# Patient Record
Sex: Male | Born: 1955 | Race: White | Hispanic: No | Marital: Married | Smoking: Former smoker
Health system: Southern US, Community
[De-identification: ages and names within clinical notes are randomized; demographics above are authoritative.]

## PROBLEM LIST (undated history)

## (undated) DIAGNOSIS — I9589 Other hypotension: Secondary | ICD-10-CM

## (undated) DIAGNOSIS — I471 Supraventricular tachycardia: Secondary | ICD-10-CM

## (undated) DIAGNOSIS — Z87891 Personal history of nicotine dependence: Secondary | ICD-10-CM

## (undated) DIAGNOSIS — M109 Gout, unspecified: Secondary | ICD-10-CM

## (undated) DIAGNOSIS — F32A Depression, unspecified: Secondary | ICD-10-CM

## (undated) DIAGNOSIS — I255 Ischemic cardiomyopathy: Secondary | ICD-10-CM

## (undated) DIAGNOSIS — I779 Disorder of arteries and arterioles, unspecified: Secondary | ICD-10-CM

## (undated) DIAGNOSIS — C7A1 Malignant poorly differentiated neuroendocrine tumors: Secondary | ICD-10-CM

## (undated) DIAGNOSIS — Z973 Presence of spectacles and contact lenses: Secondary | ICD-10-CM

## (undated) DIAGNOSIS — Z9289 Personal history of other medical treatment: Secondary | ICD-10-CM

## (undated) DIAGNOSIS — F329 Major depressive disorder, single episode, unspecified: Secondary | ICD-10-CM

## (undated) DIAGNOSIS — G43909 Migraine, unspecified, not intractable, without status migrainosus: Secondary | ICD-10-CM

## (undated) DIAGNOSIS — R0600 Dyspnea, unspecified: Secondary | ICD-10-CM

## (undated) DIAGNOSIS — J189 Pneumonia, unspecified organism: Secondary | ICD-10-CM

## (undated) DIAGNOSIS — I219 Acute myocardial infarction, unspecified: Secondary | ICD-10-CM

## (undated) DIAGNOSIS — K219 Gastro-esophageal reflux disease without esophagitis: Secondary | ICD-10-CM

## (undated) DIAGNOSIS — B1920 Unspecified viral hepatitis C without hepatic coma: Secondary | ICD-10-CM

## (undated) DIAGNOSIS — D649 Anemia, unspecified: Secondary | ICD-10-CM

## (undated) DIAGNOSIS — I739 Peripheral vascular disease, unspecified: Secondary | ICD-10-CM

## (undated) DIAGNOSIS — Z87442 Personal history of urinary calculi: Secondary | ICD-10-CM

## (undated) DIAGNOSIS — I959 Hypotension, unspecified: Secondary | ICD-10-CM

## (undated) DIAGNOSIS — Z8489 Family history of other specified conditions: Secondary | ICD-10-CM

## (undated) DIAGNOSIS — I10 Essential (primary) hypertension: Secondary | ICD-10-CM

## (undated) DIAGNOSIS — I4719 Other supraventricular tachycardia: Secondary | ICD-10-CM

## (undated) DIAGNOSIS — E7800 Pure hypercholesterolemia, unspecified: Secondary | ICD-10-CM

## (undated) DIAGNOSIS — C44319 Basal cell carcinoma of skin of other parts of face: Secondary | ICD-10-CM

## (undated) DIAGNOSIS — R55 Syncope and collapse: Secondary | ICD-10-CM

## (undated) DIAGNOSIS — R001 Bradycardia, unspecified: Secondary | ICD-10-CM

## (undated) DIAGNOSIS — F419 Anxiety disorder, unspecified: Secondary | ICD-10-CM

## (undated) DIAGNOSIS — Z972 Presence of dental prosthetic device (complete) (partial): Secondary | ICD-10-CM

## (undated) DIAGNOSIS — I639 Cerebral infarction, unspecified: Secondary | ICD-10-CM

## (undated) DIAGNOSIS — C7A8 Other malignant neuroendocrine tumors: Secondary | ICD-10-CM

## (undated) DIAGNOSIS — I2510 Atherosclerotic heart disease of native coronary artery without angina pectoris: Secondary | ICD-10-CM

## (undated) DIAGNOSIS — M1990 Unspecified osteoarthritis, unspecified site: Secondary | ICD-10-CM

## (undated) DIAGNOSIS — R002 Palpitations: Secondary | ICD-10-CM

## (undated) HISTORY — DX: Atherosclerotic heart disease of native coronary artery without angina pectoris: I25.10

## (undated) HISTORY — DX: Bradycardia, unspecified: R00.1

## (undated) HISTORY — DX: Syncope and collapse: R55

## (undated) HISTORY — PX: BASAL CELL CARCINOMA EXCISION: SHX1214

## (undated) HISTORY — DX: Other hypotension: I95.89

## (undated) HISTORY — DX: Ischemic cardiomyopathy: I25.5

## (undated) HISTORY — PX: CARDIAC CATHETERIZATION: SHX172

## (undated) HISTORY — DX: Personal history of nicotine dependence: Z87.891

## (undated) HISTORY — DX: Disorder of arteries and arterioles, unspecified: I77.9

## (undated) HISTORY — DX: Palpitations: R00.2

## (undated) HISTORY — DX: Peripheral vascular disease, unspecified: I73.9

## (undated) HISTORY — DX: Hypotension, unspecified: I95.9

## (undated) MED FILL — Dexamethasone Sodium Phosphate Inj 100 MG/10ML: INTRAMUSCULAR | Qty: 1 | Status: AC

---
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
## 1999-10-20 ENCOUNTER — Ambulatory Visit (HOSPITAL_COMMUNITY): Admission: RE | Admit: 1999-10-20 | Discharge: 1999-10-20 | Payer: Self-pay | Admitting: Gastroenterology

## 2000-05-19 ENCOUNTER — Encounter: Payer: Self-pay | Admitting: Orthopedic Surgery

## 2000-05-19 ENCOUNTER — Encounter: Admission: RE | Admit: 2000-05-19 | Discharge: 2000-05-19 | Payer: Self-pay | Admitting: Orthopedic Surgery

## 2001-04-25 ENCOUNTER — Encounter: Payer: Self-pay | Admitting: Emergency Medicine

## 2001-04-25 ENCOUNTER — Inpatient Hospital Stay (HOSPITAL_COMMUNITY): Admission: EM | Admit: 2001-04-25 | Discharge: 2001-04-26 | Payer: Self-pay | Admitting: Emergency Medicine

## 2001-04-26 ENCOUNTER — Encounter: Payer: Self-pay | Admitting: Emergency Medicine

## 2005-05-05 ENCOUNTER — Emergency Department (HOSPITAL_COMMUNITY): Admission: EM | Admit: 2005-05-05 | Discharge: 2005-05-05 | Payer: Self-pay | Admitting: Emergency Medicine

## 2005-09-09 ENCOUNTER — Inpatient Hospital Stay (HOSPITAL_COMMUNITY): Admission: EM | Admit: 2005-09-09 | Discharge: 2005-09-10 | Payer: Self-pay | Admitting: Emergency Medicine

## 2006-01-24 ENCOUNTER — Emergency Department (HOSPITAL_COMMUNITY): Admission: EM | Admit: 2006-01-24 | Discharge: 2006-01-24 | Payer: Self-pay | Admitting: Emergency Medicine

## 2006-08-10 ENCOUNTER — Emergency Department (HOSPITAL_COMMUNITY): Admission: EM | Admit: 2006-08-10 | Discharge: 2006-08-11 | Payer: Self-pay | Admitting: Emergency Medicine

## 2007-07-09 IMAGING — CR DG CHEST 1V PORT
1 series · 1 of 1 positions shown · non-contrast
Comparison: none

CLINICAL DATA: Cough.  Shortness of breath.  Hematemesis.  
 PORTABLE CHEST - 1 VIEW:
 The heart size and mediastinal contours are within normal limits.  Both lungs are clear.

[view not recorded]
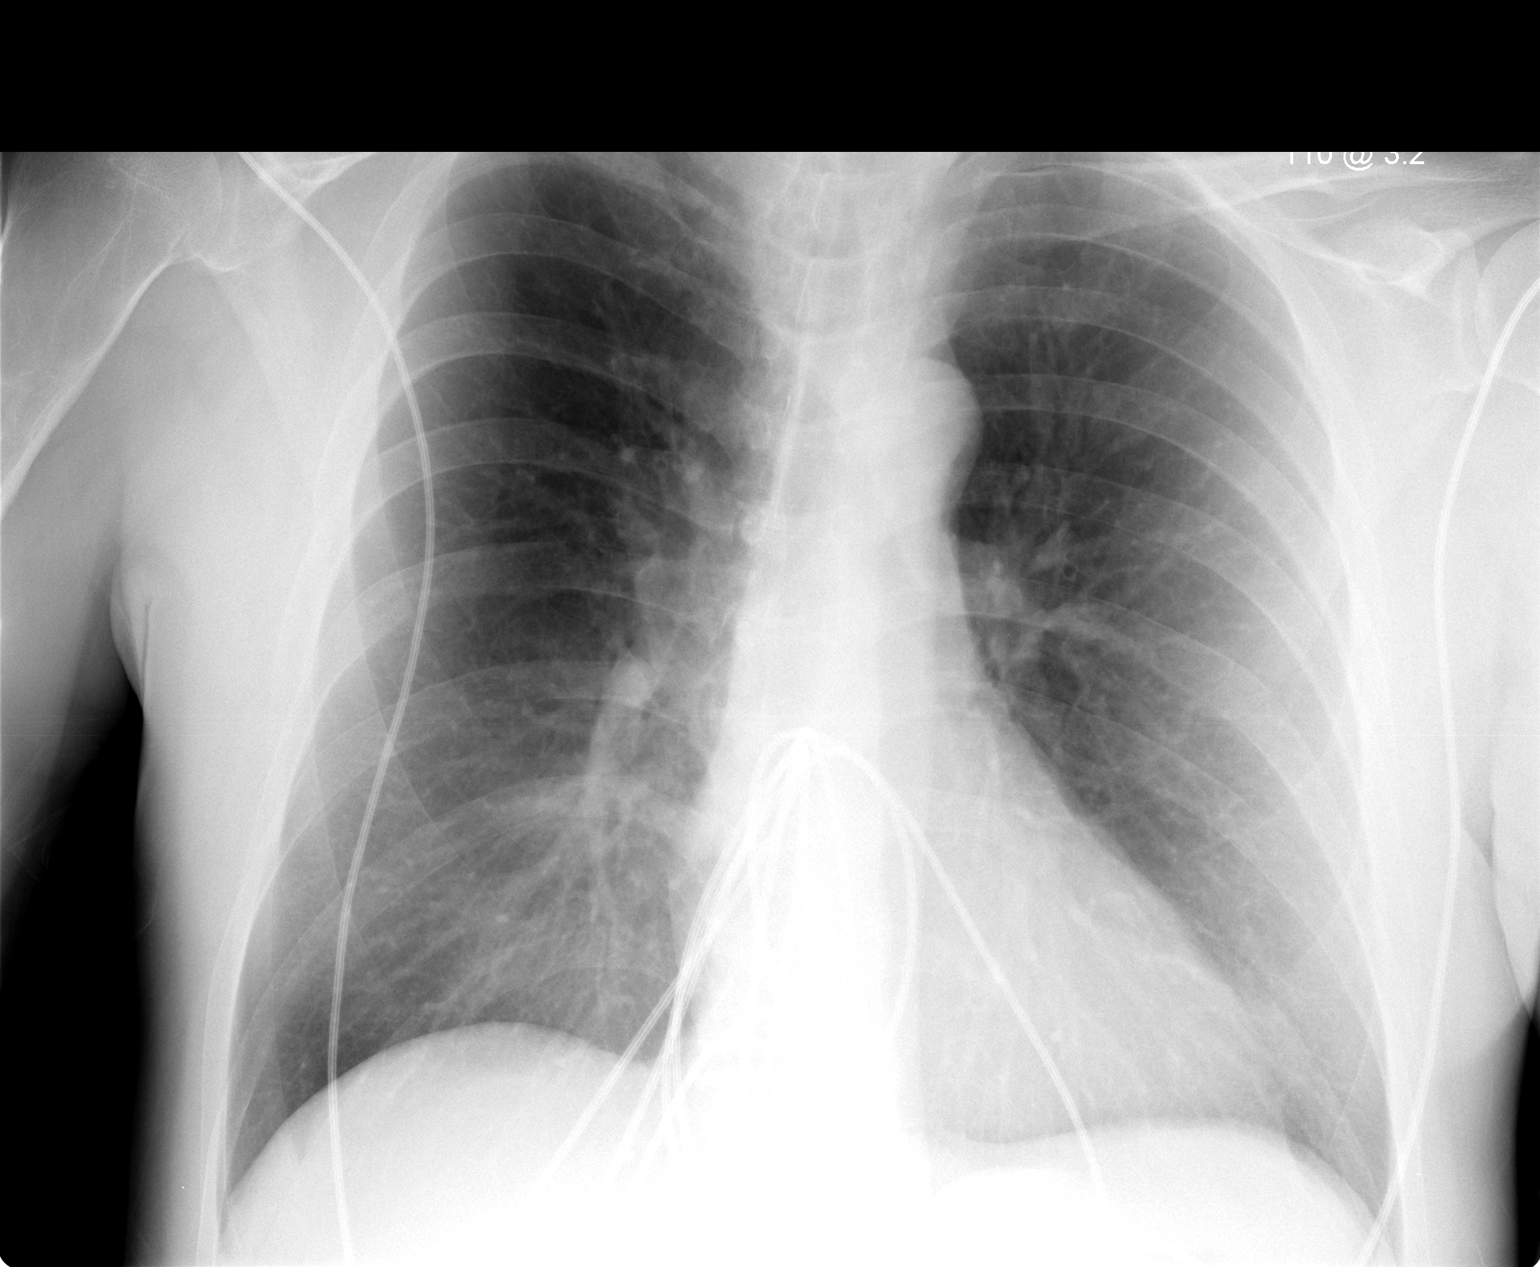

[1 of 1 positions shown; findings below may reference images not displayed]

IMPRESSION: No acute findings.

## 2008-01-31 ENCOUNTER — Emergency Department (HOSPITAL_COMMUNITY): Admission: EM | Admit: 2008-01-31 | Discharge: 2008-01-31 | Payer: Self-pay | Admitting: Emergency Medicine

## 2008-02-12 IMAGING — CT CT ABD-PELV W/O CM
2 of 4 series · 15 of 42 positions shown, 19 images · non-contrast
Comparison: NONE

CLINICAL DATA: Right lower quadrant pain.  Evaluate for 
appendicitis.  

CT ABDOMEN AND PELVIS WITHOUT INTRAVENOUS AND FOLLOWING ORAL 
CONTRAST
TECHNIQUE: Multiple axial images were obtained from the lung 
base through the pelvis following oral contrast.

[Series 2: wo · axial · 0.73mm/px · z∈[+1158,+1509]mm · 12 of 137 slices shown, 16 images]
[im 13/137  soft-tissue]
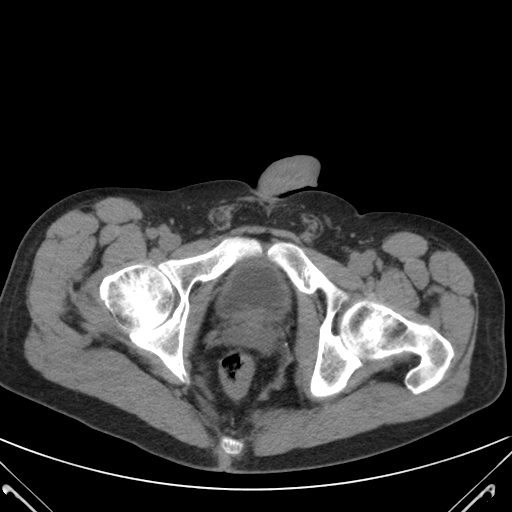
[im 13/137  bone]
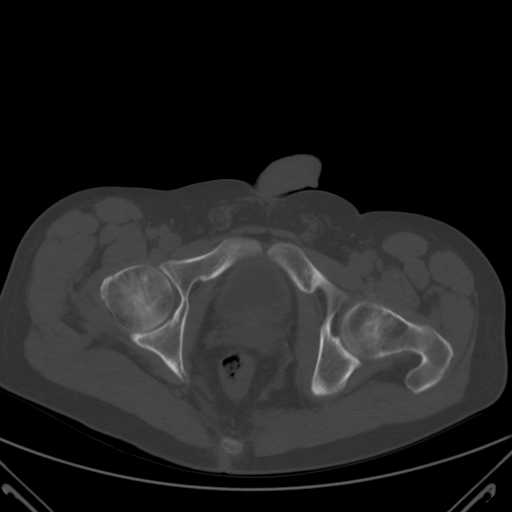
[im 25/137  soft-tissue]
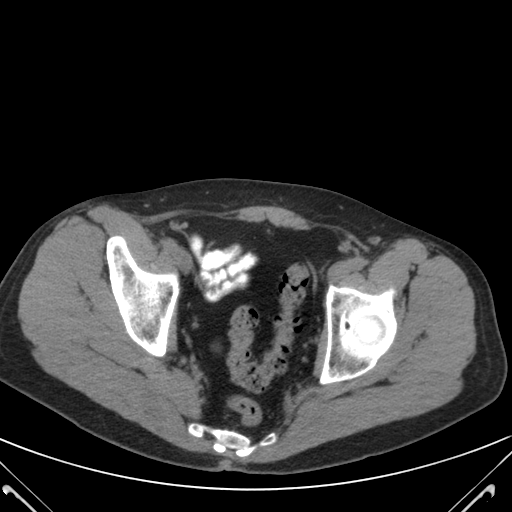
[im 38/137  soft-tissue]
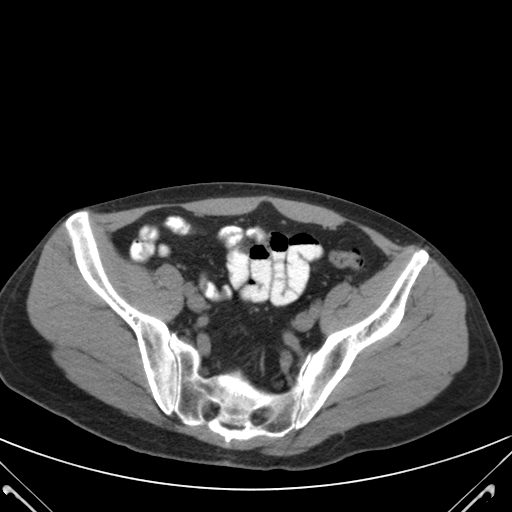
[im 50/137  soft-tissue]
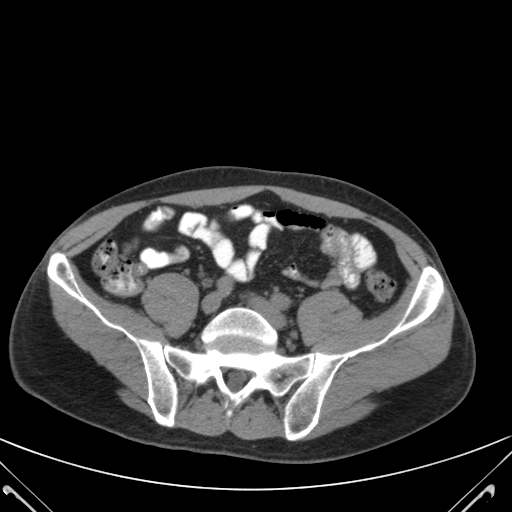
[im 62/137  soft-tissue]
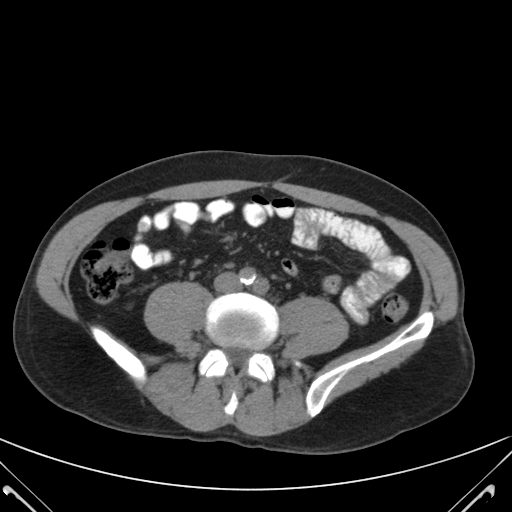
[im 75/137  soft-tissue]
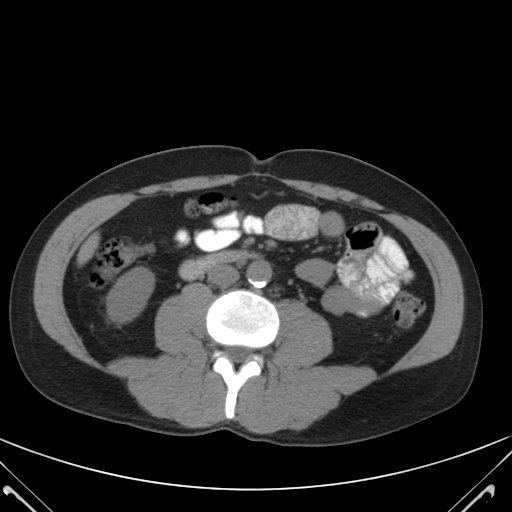
[im 87/137  soft-tissue]
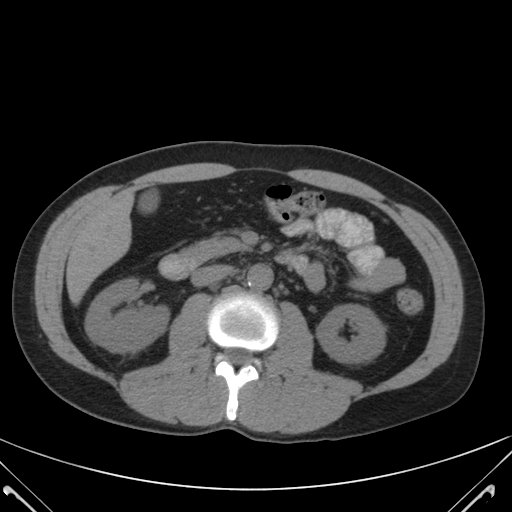
[im 99/137  soft-tissue]
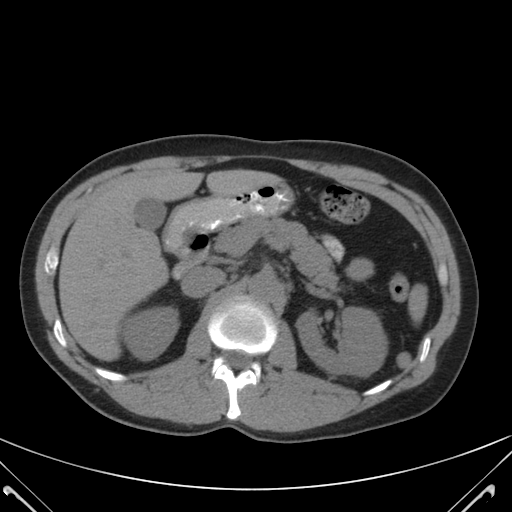
[im 112/137  soft-tissue]
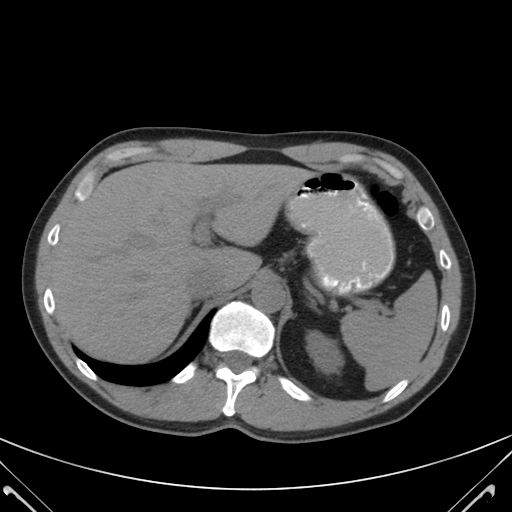
[im 112/137  lung]
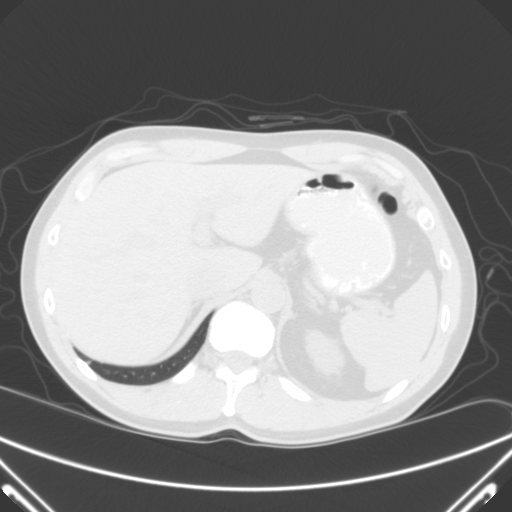
[im 112/137  bone]
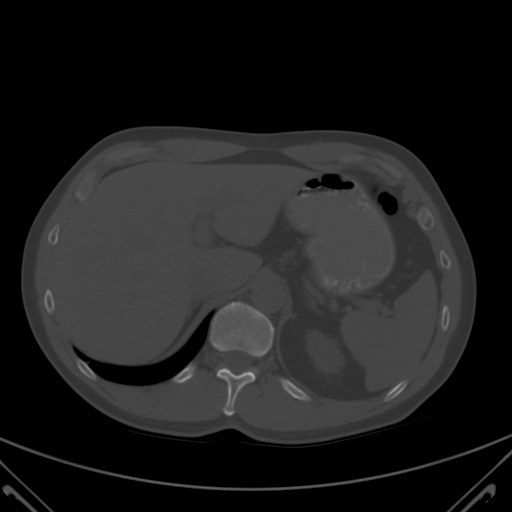
[im 118/137  lung]
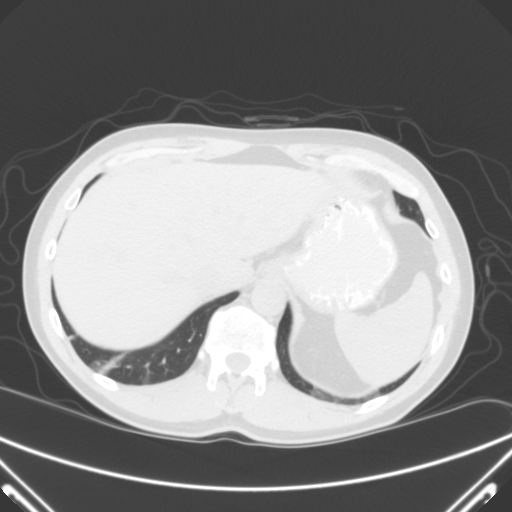
[im 124/137  soft-tissue]
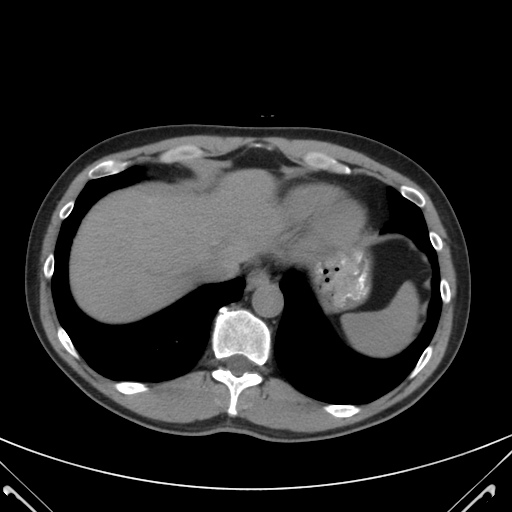
[im 124/137  lung]
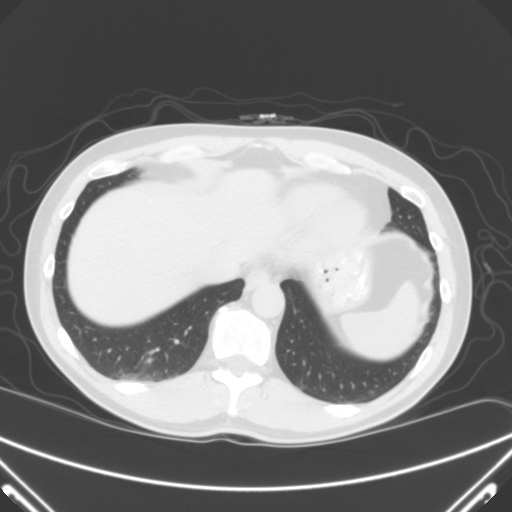
[im 130/137  lung]
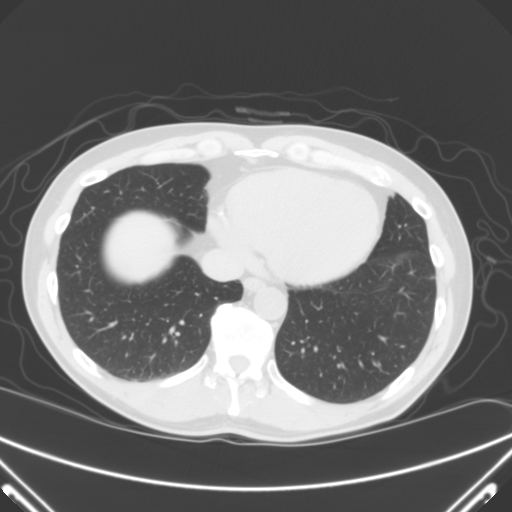

[coronals · coronal · 0.79mm/px · 3 of 79 slices shown]
[im 27/79  soft-tissue]
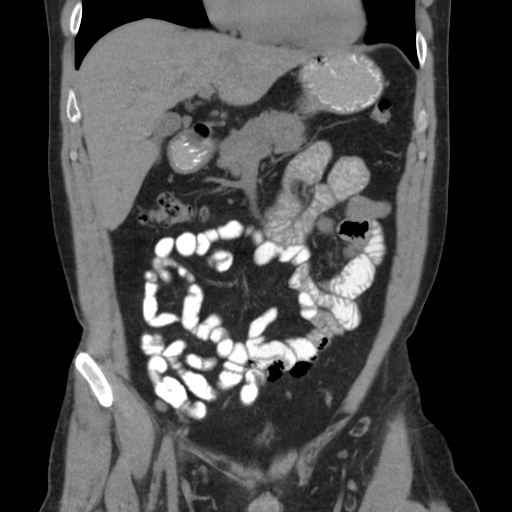
[im 35/79  soft-tissue]
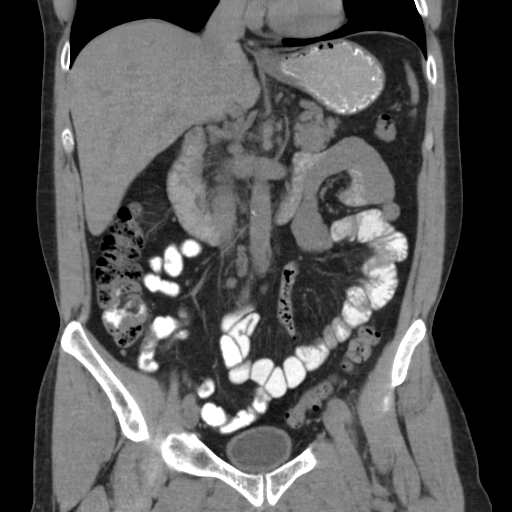
[im 44/79  soft-tissue]
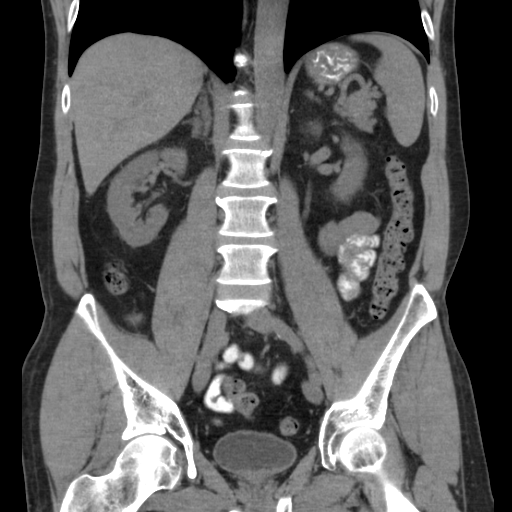

[15 of 42 positions shown; findings below may reference images not displayed]

FINDINGS: Lung bases are clear.  Liver, spleen, gallbladder, 
pancreas, kidneys, adrenal glands, aorta, and vena cava show no 
acute process.  Bowel and mesentery including terminal ileum, 
cecum, and appendix appear entirely normal.  No CT evidence to 
suggest appendicitis.  Appendix is well-visualized. Bladder, 
seminal vesicles, and prostate appear normal.  No hernia.
IMPRESSION: No acute process.  Normal appendix by CT criteria. 
Dict Date: 10/19/2006  Tran Date: 10/19/2006 NBC  JLM

## 2008-03-07 ENCOUNTER — Emergency Department (HOSPITAL_COMMUNITY): Admission: EM | Admit: 2008-03-07 | Discharge: 2008-03-08 | Payer: Self-pay | Admitting: Emergency Medicine

## 2008-03-08 ENCOUNTER — Inpatient Hospital Stay (HOSPITAL_COMMUNITY): Admission: EM | Admit: 2008-03-08 | Discharge: 2008-03-09 | Payer: Self-pay | Admitting: *Deleted

## 2008-03-08 ENCOUNTER — Ambulatory Visit: Payer: Self-pay | Admitting: *Deleted

## 2008-07-17 ENCOUNTER — Encounter: Admission: RE | Admit: 2008-07-17 | Discharge: 2008-07-17 | Payer: Self-pay | Admitting: Gastroenterology

## 2008-08-28 ENCOUNTER — Emergency Department (HOSPITAL_BASED_OUTPATIENT_CLINIC_OR_DEPARTMENT_OTHER): Admission: EM | Admit: 2008-08-28 | Discharge: 2008-08-28 | Payer: Self-pay | Admitting: Emergency Medicine

## 2008-08-28 ENCOUNTER — Ambulatory Visit: Payer: Self-pay | Admitting: Diagnostic Radiology

## 2008-10-04 ENCOUNTER — Emergency Department (HOSPITAL_BASED_OUTPATIENT_CLINIC_OR_DEPARTMENT_OTHER): Admission: EM | Admit: 2008-10-04 | Discharge: 2008-10-04 | Payer: Self-pay | Admitting: Emergency Medicine

## 2008-10-04 ENCOUNTER — Ambulatory Visit: Payer: Self-pay | Admitting: Diagnostic Radiology

## 2008-10-29 IMAGING — US US ABDOMEN COMPLETE
1 series · 14 of 25 positions shown · non-contrast
Comparison: NONE

CLINICAL DATA: Chronic hepatitis C. Question hepatomegaly on 
physical  exam. 

ABDOMINAL ULTRASOUND

[Series 1: us abd · 0.33mm/px · 14 of 43 slices shown]
[im 1/43]
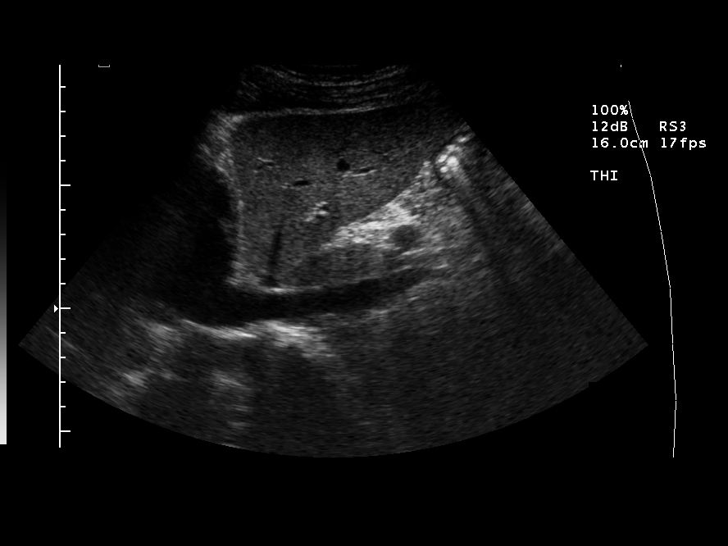
[im 4/43]
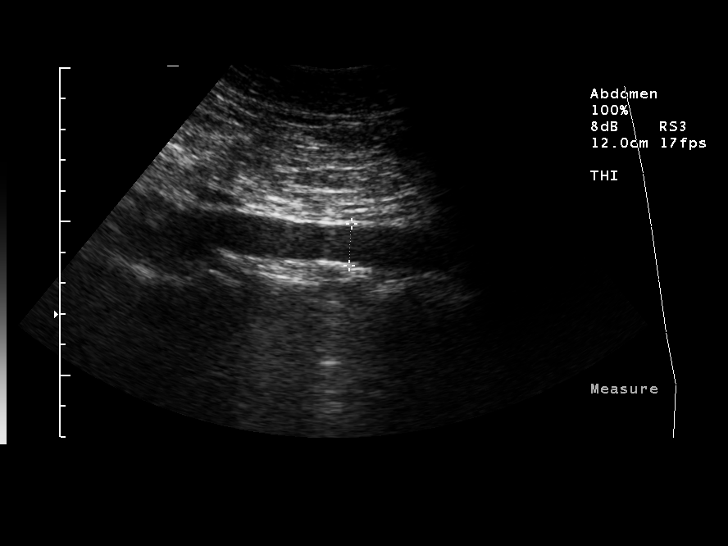
[im 8/43]
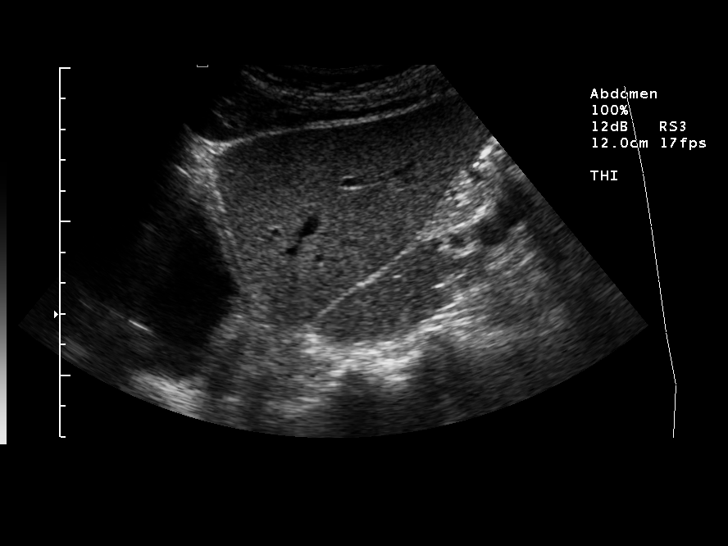
[im 11/43]
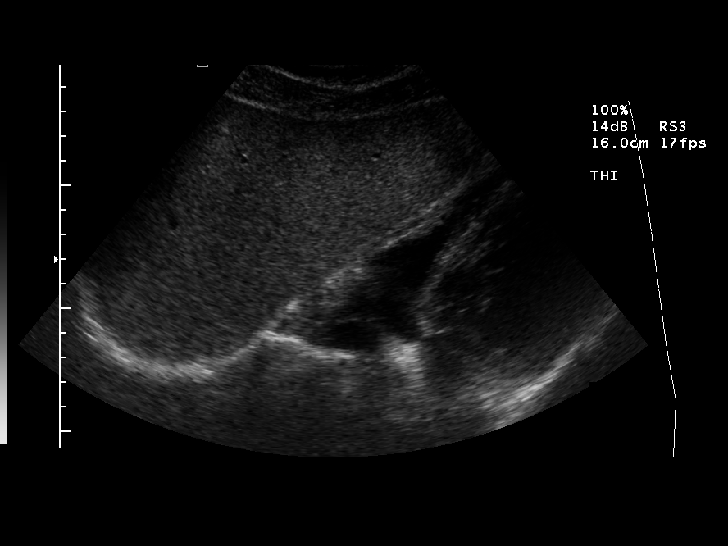
[im 15/43]
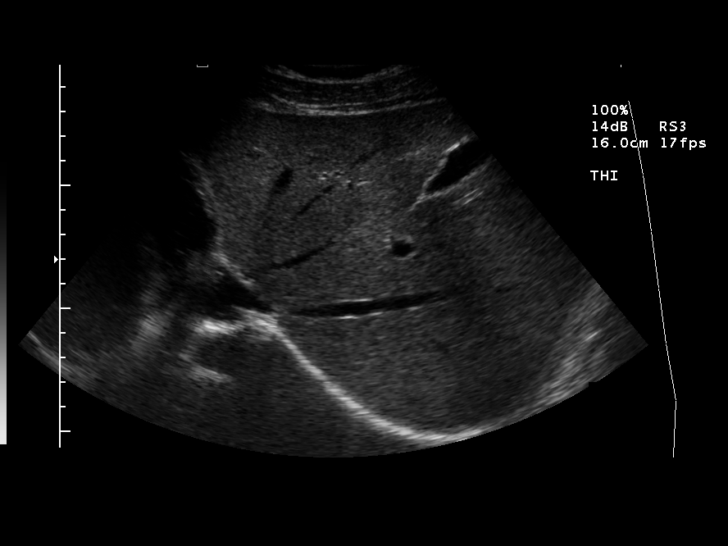
[im 16/43]
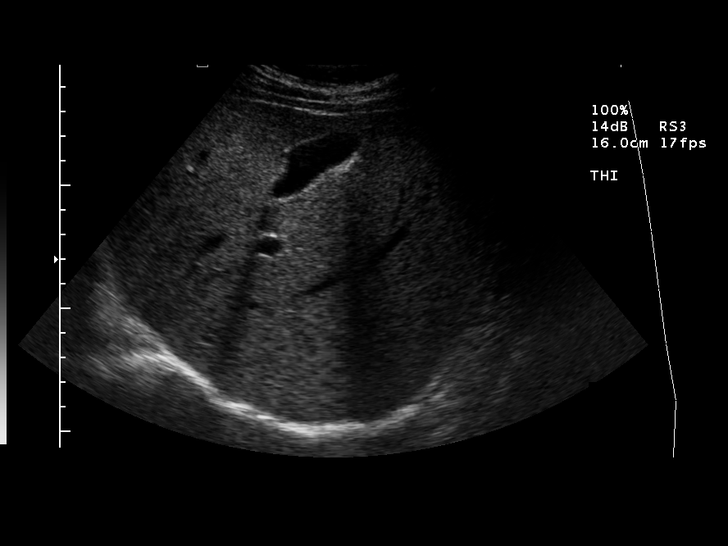
[im 20/43]
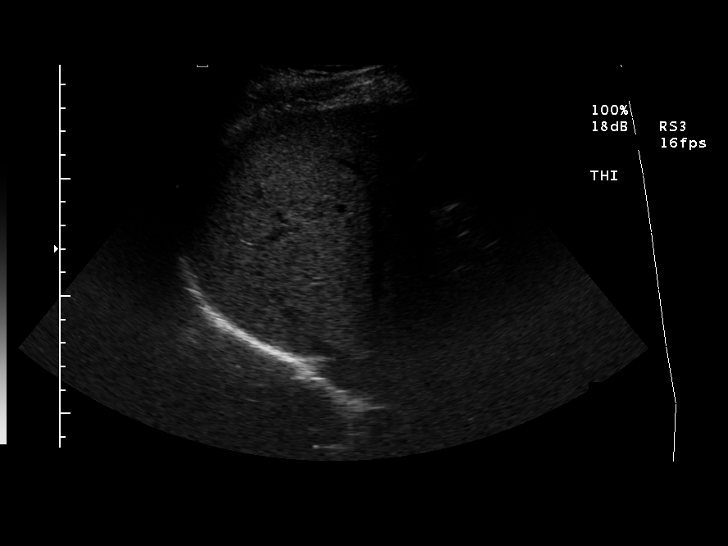
[im 23/43]
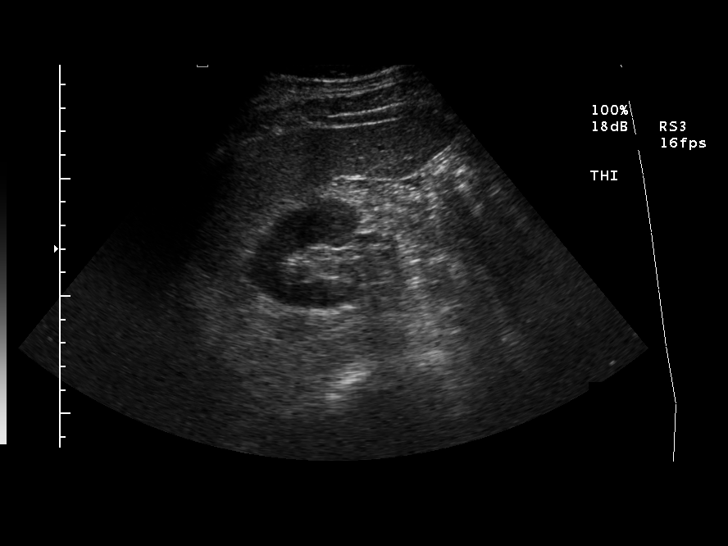
[im 27/43]
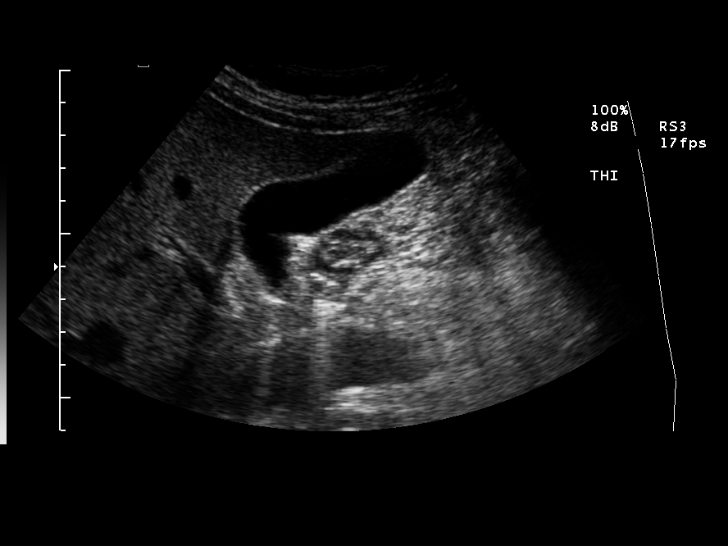
[im 29/43]
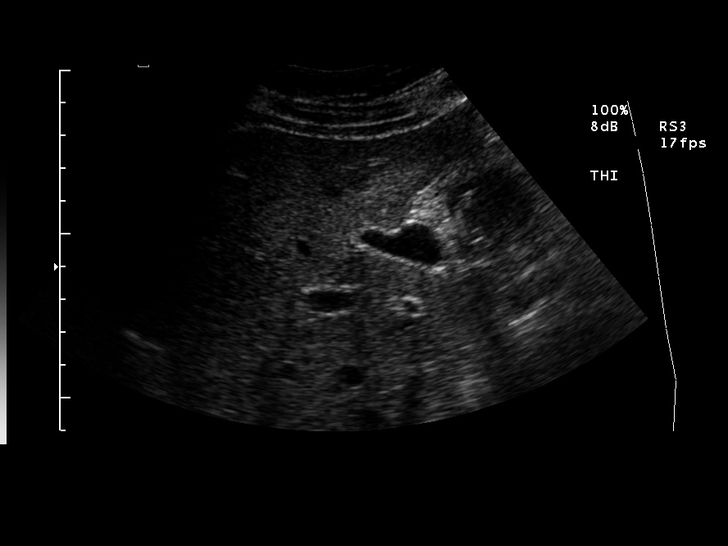
[im 32/43]
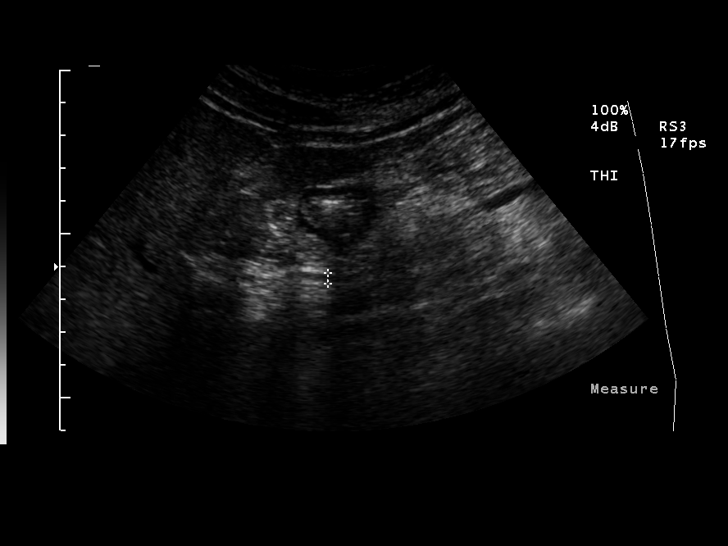
[im 36/43]
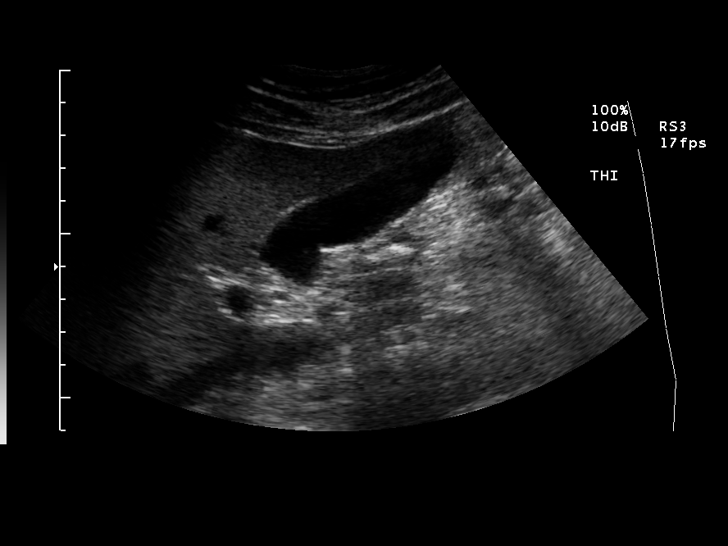
[im 39/43]
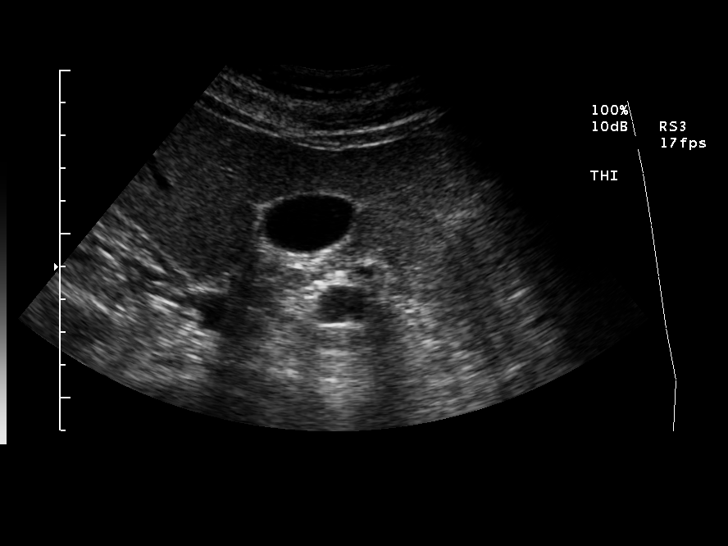
[im 43/43]
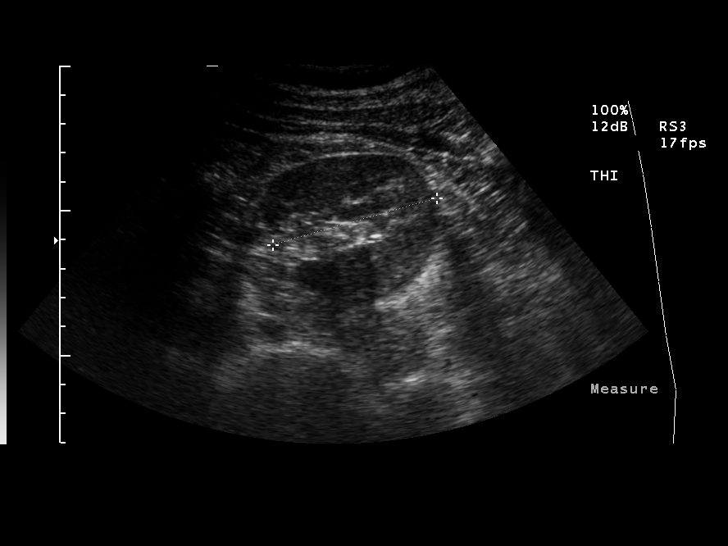

[14 of 25 positions shown; findings below may reference images not displayed]

FINDINGS: The liver appears normal in size and echo appearance 
with sagittal dimension of 15 cm. The gallbladder is normal in 
appearance without stones present. The common duct is non-dilated 
measuring 0.3 cm. The pancreas was well demonstrated and was 
normal in appearance. The abdominal aorta was normal in caliber 
and echo appearance with AP measurements of 2.2 cm proximally, mid 
1.8 cm, and distally 1.4 cm. The spleen measured 9.7 cm and was 
normal in echo appearance.Both kidneys were normal in size and 
echo appearance. The right kidney measured 10.7 cm in length, and 
the left kidney measured 11.8 cm in length.
IMPRESSION: Negative ultrasound evaluation of the abdomen. Guven 
Reinelde Torck, M.D. electronically reviewed on 07/10/2007 Dict Date: 
07/06/2007  Tran Date: 07/10/2007 CAV  [REDACTED]

## 2009-07-17 ENCOUNTER — Encounter: Admission: RE | Admit: 2009-07-17 | Discharge: 2009-07-17 | Payer: Self-pay | Admitting: Gastroenterology

## 2010-04-22 ENCOUNTER — Encounter: Admission: RE | Admit: 2010-04-22 | Discharge: 2010-04-22 | Payer: Self-pay | Admitting: Gastroenterology

## 2010-06-27 IMAGING — CR DG ANKLE COMPLETE 3+V*L*
3 series · 3 of 3 positions shown · non-contrast
Comparison: None

CLINICAL DATA: Twisted left ankle 2 days ago with lateral pain and
swelling.

LEFT ANKLE COMPLETE - 3+ VIEW

[t ankle joint ap left]
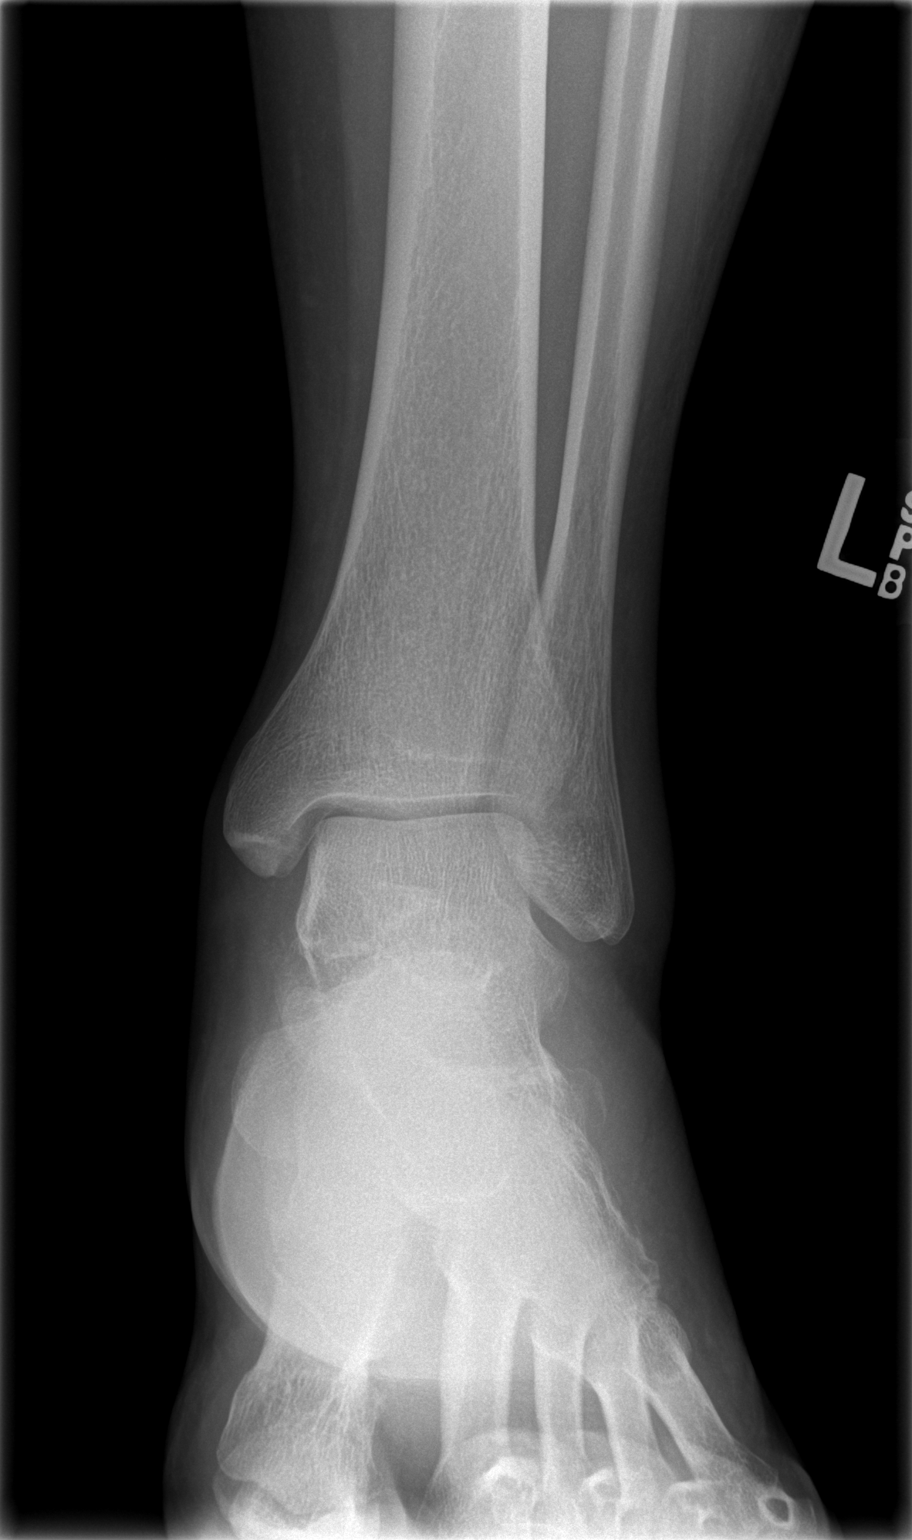

[t ankle joint oblique left]
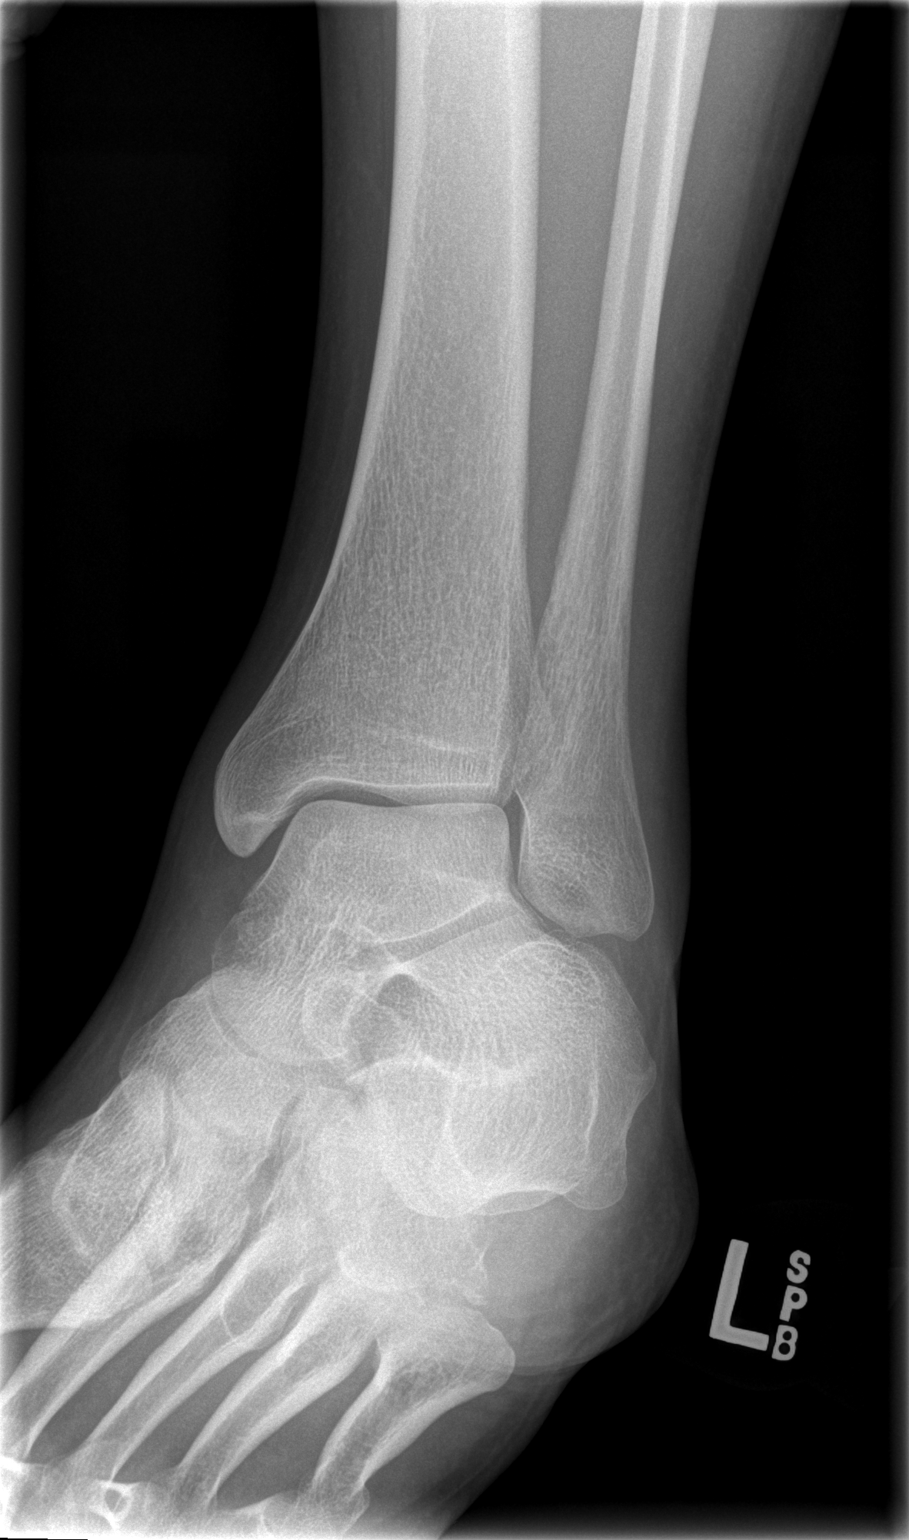

[t ankle joint lat left]
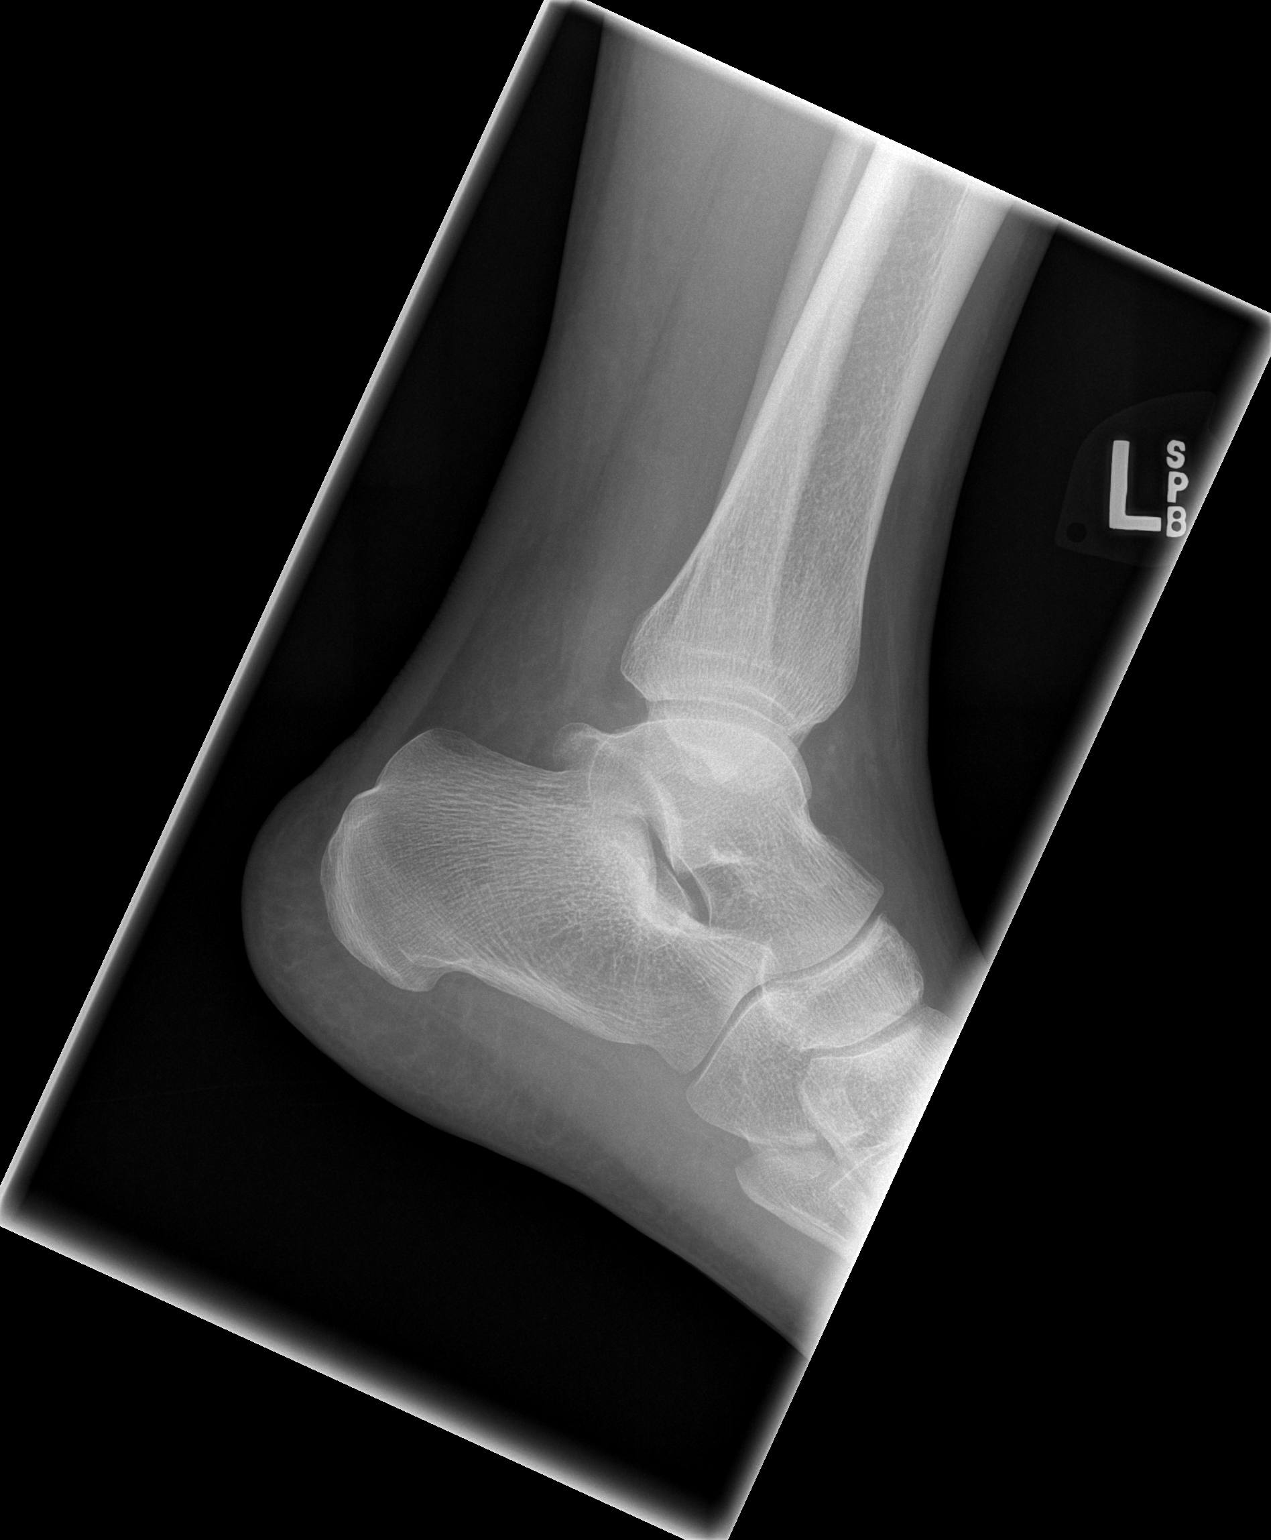

[3 of 3 positions shown; findings below may reference images not displayed]

FINDINGS: Mild soft tissue swelling is seen over the lateral
malleolus.  No underlying acute fracture.
IMPRESSION: Lateral soft tissue swelling without fracture.

## 2010-07-12 ENCOUNTER — Emergency Department: Payer: Self-pay | Admitting: Emergency Medicine

## 2010-07-12 ENCOUNTER — Emergency Department (HOSPITAL_BASED_OUTPATIENT_CLINIC_OR_DEPARTMENT_OTHER): Admission: EM | Admit: 2010-07-12 | Discharge: 2010-07-12 | Payer: Self-pay | Admitting: Emergency Medicine

## 2010-07-12 ENCOUNTER — Ambulatory Visit: Payer: Self-pay | Admitting: Diagnostic Radiology

## 2010-08-03 IMAGING — CR DG ANKLE COMPLETE 3+V*R*
3 series · 3 of 3 positions shown · non-contrast
Comparison: None

CLINICAL DATA: Pain pain

RIGHT ANKLE - COMPLETE 3+ VIEW

[t ankle joint ap right]
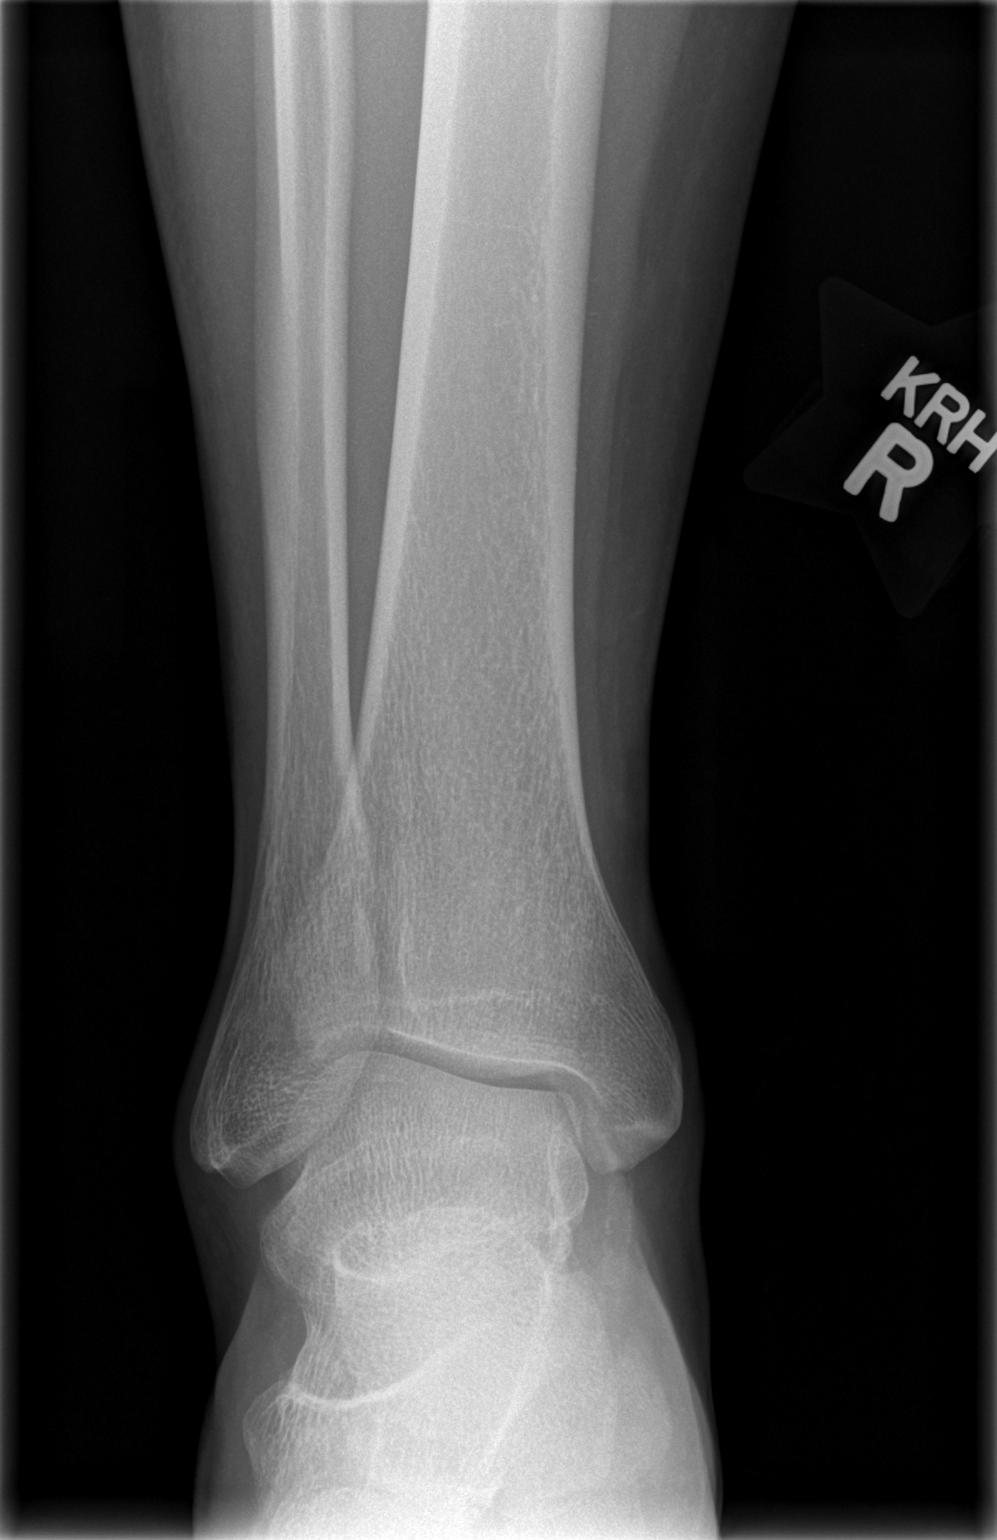

[t ankle joint oblique right]
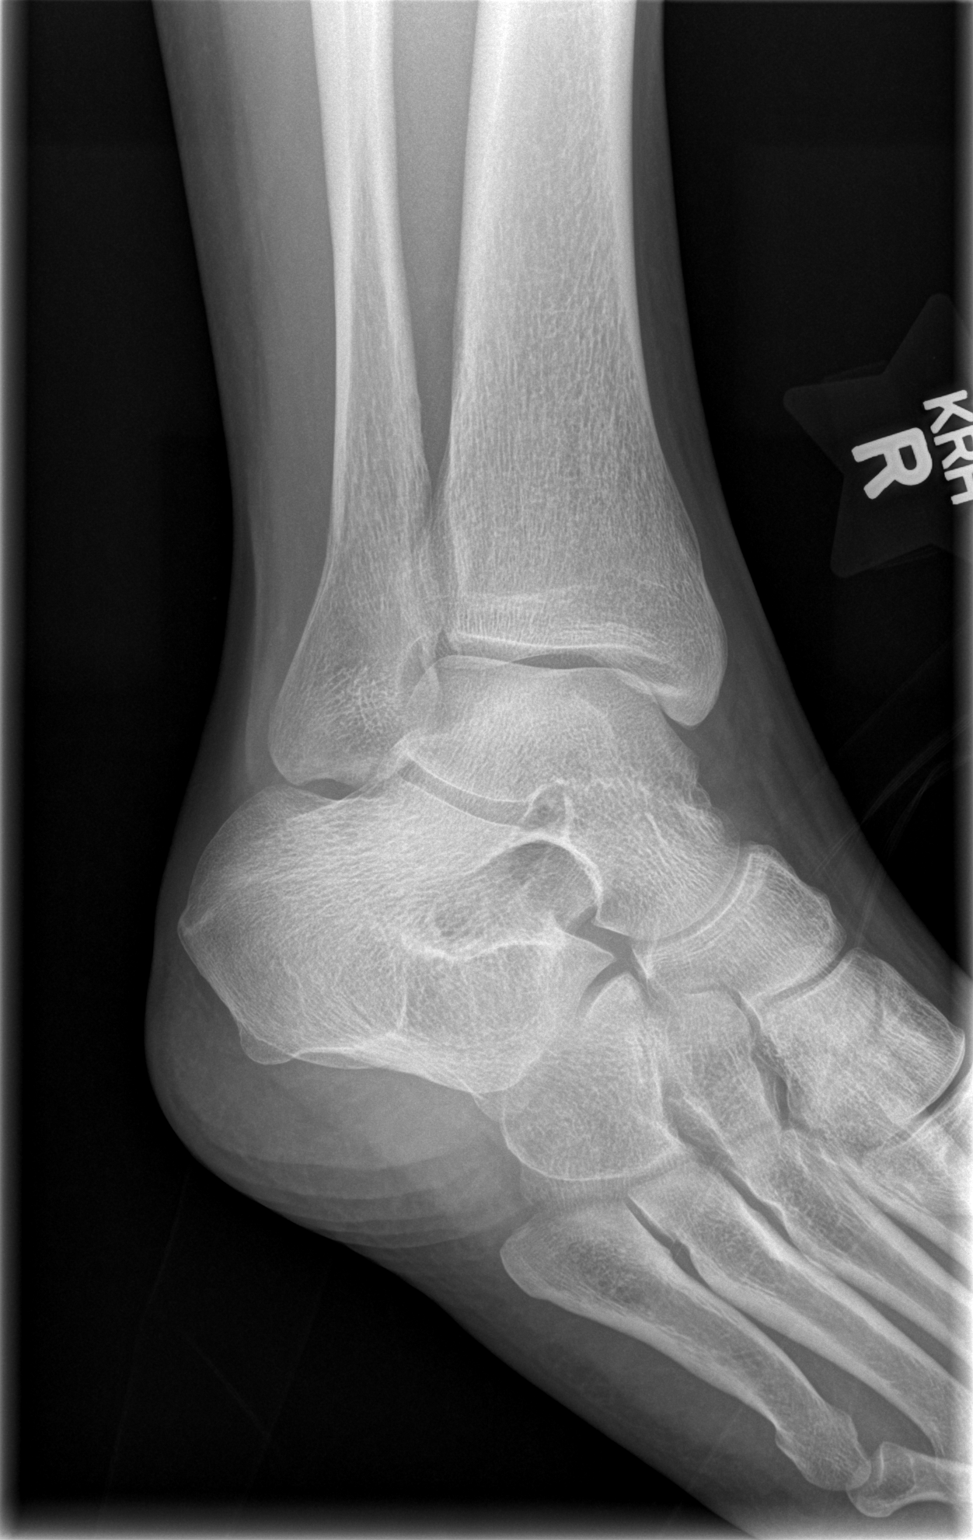

[t ankle joint lat right]
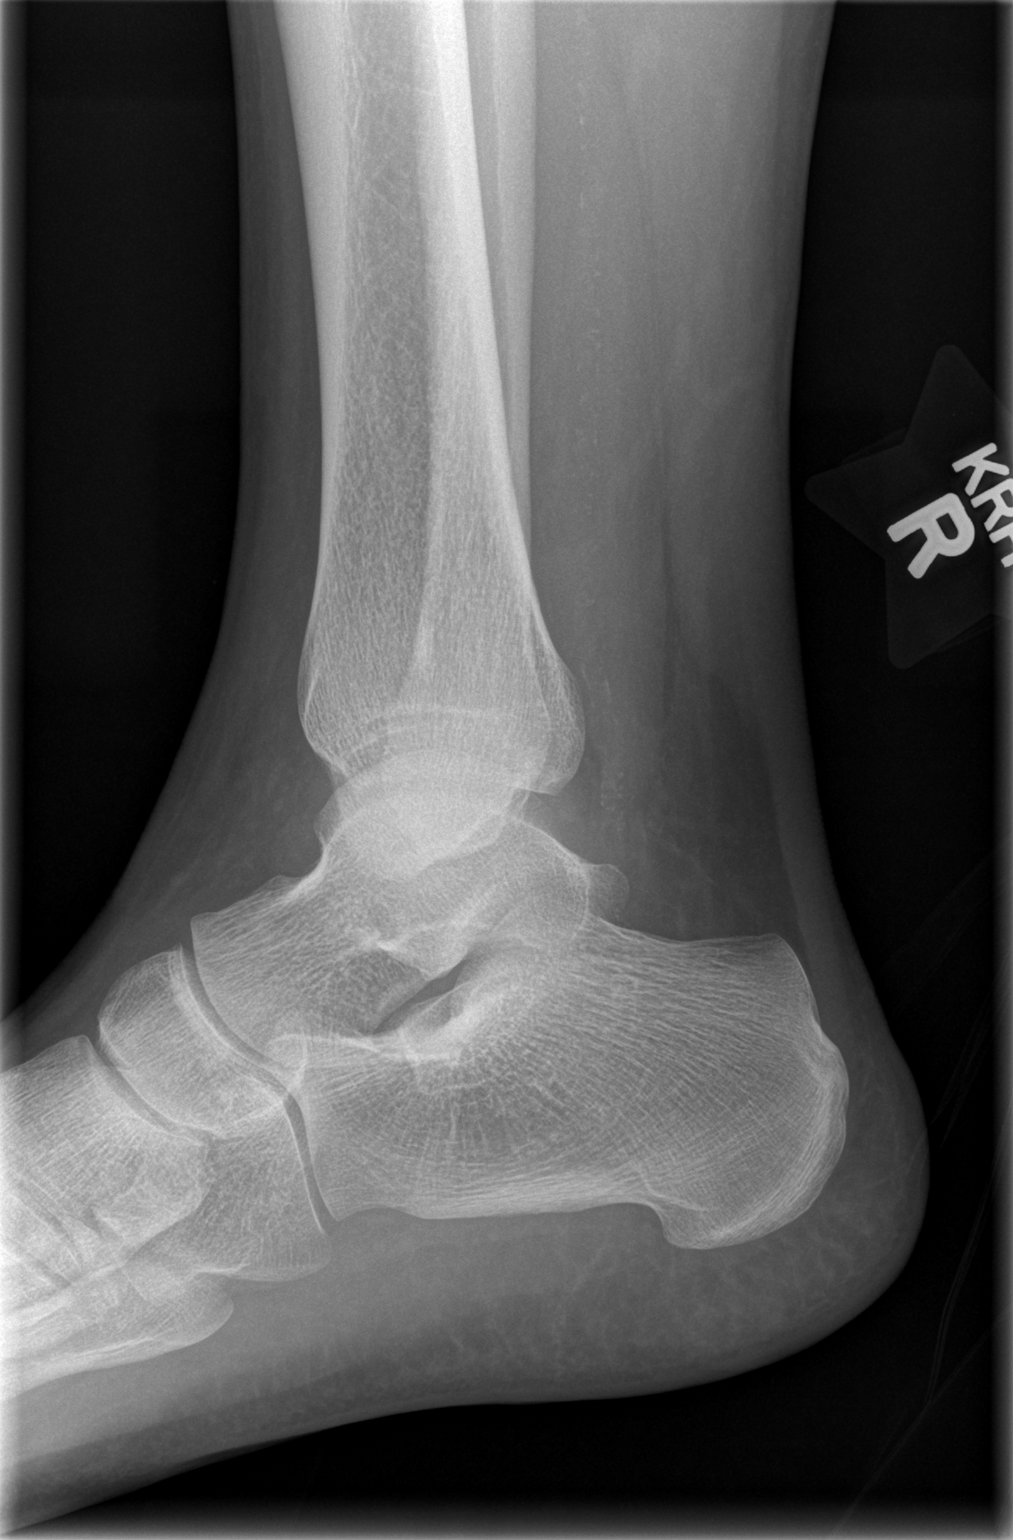

[3 of 3 positions shown; findings below may reference images not displayed]

FINDINGS: There is no evidence of fracture or dislocation.  There
is no evidence of arthropathy or other focal bone abnormality.
Soft tissues are unremarkable.
IMPRESSION: No acute findings.

REF:G1 DICTATED: 10/04/2008 [DATE]

## 2010-08-24 ENCOUNTER — Emergency Department (HOSPITAL_COMMUNITY)
Admission: EM | Admit: 2010-08-24 | Discharge: 2010-08-24 | Payer: Self-pay | Source: Home / Self Care | Admitting: Emergency Medicine

## 2010-09-20 ENCOUNTER — Encounter: Payer: Self-pay | Admitting: Gastroenterology

## 2010-10-28 ENCOUNTER — Emergency Department (INDEPENDENT_AMBULATORY_CARE_PROVIDER_SITE_OTHER): Payer: BC Managed Care – PPO

## 2010-10-28 ENCOUNTER — Emergency Department (HOSPITAL_BASED_OUTPATIENT_CLINIC_OR_DEPARTMENT_OTHER)
Admission: EM | Admit: 2010-10-28 | Discharge: 2010-10-29 | Disposition: A | Discharge status: Home / Self Care | Payer: BC Managed Care – PPO | Attending: Emergency Medicine | Admitting: Emergency Medicine

## 2010-10-28 DIAGNOSIS — K589 Irritable bowel syndrome without diarrhea: Secondary | ICD-10-CM | POA: Insufficient documentation

## 2010-10-28 DIAGNOSIS — R109 Unspecified abdominal pain: Secondary | ICD-10-CM

## 2010-10-28 DIAGNOSIS — R1031 Right lower quadrant pain: Secondary | ICD-10-CM | POA: Insufficient documentation

## 2010-10-28 DIAGNOSIS — F17200 Nicotine dependence, unspecified, uncomplicated: Secondary | ICD-10-CM | POA: Insufficient documentation

## 2010-10-28 DIAGNOSIS — K219 Gastro-esophageal reflux disease without esophagitis: Secondary | ICD-10-CM | POA: Insufficient documentation

## 2010-10-28 LAB — DIFFERENTIAL
Basophils Absolute: 0 10*3/uL (ref 0.0–0.1)
Basophils Relative: 0 % (ref 0–1)
Eosinophils Absolute: 0.4 10*3/uL (ref 0.0–0.7)
Eosinophils Relative: 4 % (ref 0–5)
Lymphocytes Relative: 36 % (ref 12–46)
Lymphs Abs: 3.5 10*3/uL (ref 0.7–4.0)
Monocytes Absolute: 0.8 10*3/uL (ref 0.1–1.0)
Monocytes Relative: 8 % (ref 3–12)
Neutro Abs: 5.1 10*3/uL (ref 1.7–7.7)
Neutrophils Relative %: 52 % (ref 43–77)

## 2010-10-28 LAB — CBC
HCT: 39.6 % (ref 39.0–52.0)
Hemoglobin: 13.8 g/dL (ref 13.0–17.0)
MCH: 31.7 pg (ref 26.0–34.0)
MCHC: 34.8 g/dL (ref 30.0–36.0)
MCV: 90.8 fL (ref 78.0–100.0)
Platelets: 175 10*3/uL (ref 150–400)
RBC: 4.36 MIL/uL (ref 4.22–5.81)
RDW: 13 % (ref 11.5–15.5)
WBC: 9.7 10*3/uL (ref 4.0–10.5)

## 2010-10-28 LAB — HEPATIC FUNCTION PANEL
ALT: 55 U/L — ABNORMAL HIGH (ref 0–53)
AST: 37 U/L (ref 0–37)
Albumin: 3.7 g/dL (ref 3.5–5.2)
Alkaline Phosphatase: 58 U/L (ref 39–117)
Bilirubin, Direct: 0 mg/dL (ref 0.0–0.3)
Indirect Bilirubin: 0.8 mg/dL (ref 0.3–0.9)
Total Bilirubin: 0.8 mg/dL (ref 0.3–1.2)
Total Protein: 7 g/dL (ref 6.0–8.3)

## 2010-10-28 LAB — BASIC METABOLIC PANEL
BUN: 13 mg/dL (ref 6–23)
CO2: 28 mEq/L (ref 19–32)
Calcium: 9.5 mg/dL (ref 8.4–10.5)
Chloride: 107 mEq/L (ref 96–112)
Creatinine, Ser: 0.9 mg/dL (ref 0.4–1.5)
GFR calc Af Amer: >60 mL/min (ref >60)
GFR calc non Af Amer: >60 mL/min (ref >60)
Glucose, Bld: 94 mg/dL (ref 70–99)
Potassium: 4.3 mEq/L (ref 3.5–5.1)
Sodium: 142 mEq/L (ref 135–145)

## 2010-10-28 LAB — URINALYSIS, ROUTINE W REFLEX MICROSCOPIC
Bilirubin Urine: NEGATIVE
Hgb urine dipstick: NEGATIVE
Ketones, ur: NEGATIVE mg/dL
Nitrite: NEGATIVE
Protein, ur: NEGATIVE mg/dL
Specific Gravity, Urine: 1.021 (ref 1.005–1.030)
Urine Glucose, Fasting: NEGATIVE mg/dL
Urobilinogen, UA: 0.2 mg/dL (ref 0.0–1.0)
pH: 6.5 (ref 5.0–8.0)

## 2010-11-09 LAB — DIFFERENTIAL
Basophils Absolute: 0 10*3/uL (ref 0.0–0.1)
Basophils Relative: 0 % (ref 0–1)
Eosinophils Absolute: 0.2 10*3/uL (ref 0.0–0.7)
Eosinophils Relative: 3 % (ref 0–5)
Lymphocytes Relative: 28 % (ref 12–46)
Lymphs Abs: 1.6 10*3/uL (ref 0.7–4.0)
Monocytes Absolute: 0.5 10*3/uL (ref 0.1–1.0)
Monocytes Relative: 8 % (ref 3–12)
Neutro Abs: 3.4 10*3/uL (ref 1.7–7.7)
Neutrophils Relative %: 60 % (ref 43–77)

## 2010-11-09 LAB — URINALYSIS, ROUTINE W REFLEX MICROSCOPIC
Bilirubin Urine: NEGATIVE
Glucose, UA: NEGATIVE mg/dL
Hgb urine dipstick: NEGATIVE
Ketones, ur: NEGATIVE mg/dL
Nitrite: NEGATIVE
Protein, ur: NEGATIVE mg/dL
Specific Gravity, Urine: 1.01 (ref 1.005–1.030)
Urobilinogen, UA: 0.2 mg/dL (ref 0.0–1.0)
pH: 7 (ref 5.0–8.0)

## 2010-11-09 LAB — COMPREHENSIVE METABOLIC PANEL
ALT: 31 U/L (ref 0–53)
AST: 29 U/L (ref 0–37)
Albumin: 4.2 g/dL (ref 3.5–5.2)
Alkaline Phosphatase: 56 U/L (ref 39–117)
BUN: 12 mg/dL (ref 6–23)
CO2: 31 mEq/L (ref 19–32)
Calcium: 9.7 mg/dL (ref 8.4–10.5)
Chloride: 105 mEq/L (ref 96–112)
Creatinine, Ser: 1.04 mg/dL (ref 0.4–1.5)
GFR calc Af Amer: >60 mL/min (ref >60)
GFR calc non Af Amer: >60 mL/min (ref >60)
Glucose, Bld: 86 mg/dL (ref 70–99)
Potassium: 4 mEq/L (ref 3.5–5.1)
Sodium: 142 mEq/L (ref 135–145)
Total Bilirubin: 0.7 mg/dL (ref 0.3–1.2)
Total Protein: 7.1 g/dL (ref 6.0–8.3)

## 2010-11-09 LAB — CBC
HCT: 45.8 % (ref 39.0–52.0)
Hemoglobin: 15.3 g/dL (ref 13.0–17.0)
MCH: 31 pg (ref 26.0–34.0)
MCHC: 33.4 g/dL (ref 30.0–36.0)
MCV: 92.9 fL (ref 78.0–100.0)
Platelets: 158 10*3/uL (ref 150–400)
RBC: 4.93 MIL/uL (ref 4.22–5.81)
RDW: 12.9 % (ref 11.5–15.5)
WBC: 5.7 10*3/uL (ref 4.0–10.5)

## 2010-11-09 LAB — POCT I-STAT, CHEM 8
BUN: 15 mg/dL (ref 6–23)
Calcium, Ion: 1.21 mmol/L (ref 1.12–1.32)
Chloride: 104 mEq/L (ref 96–112)
Creatinine, Ser: 1.2 mg/dL (ref 0.4–1.5)
Glucose, Bld: 86 mg/dL (ref 70–99)
HCT: 48 % (ref 39.0–52.0)
Hemoglobin: 16.3 g/dL (ref 13.0–17.0)
Potassium: 4.1 mEq/L (ref 3.5–5.1)
Sodium: 143 mEq/L (ref 135–145)
TCO2: 31 mmol/L (ref 0–100)

## 2010-11-09 LAB — PROTIME-INR
INR: 0.95 (ref 0.00–1.49)
Prothrombin Time: 12.9 seconds (ref 11.6–15.2)

## 2010-11-09 LAB — TYPE AND SCREEN
ABO/RH(D): O POS
Antibody Screen: NEGATIVE

## 2010-11-09 LAB — OCCULT BLOOD, POC DEVICE: Fecal Occult Bld: POSITIVE

## 2010-11-09 LAB — ABO/RH: ABO/RH(D): O POS

## 2010-11-10 LAB — COMPREHENSIVE METABOLIC PANEL
ALT: 38 U/L (ref 0–53)
AST: 30 U/L (ref 0–37)
Albumin: 4.5 g/dL (ref 3.5–5.2)
Alkaline Phosphatase: 83 U/L (ref 39–117)
BUN: 13 mg/dL (ref 6–23)
CO2: 28 mEq/L (ref 19–32)
Calcium: 10 mg/dL (ref 8.4–10.5)
Chloride: 106 mEq/L (ref 96–112)
Creatinine, Ser: 1.1 mg/dL (ref 0.4–1.5)
GFR calc Af Amer: >60 mL/min (ref >60)
GFR calc non Af Amer: >60 mL/min (ref >60)
Glucose, Bld: 74 mg/dL (ref 70–99)
Potassium: 3.9 mEq/L (ref 3.5–5.1)
Sodium: 146 mEq/L — ABNORMAL HIGH (ref 135–145)
Total Bilirubin: 0.5 mg/dL (ref 0.3–1.2)
Total Protein: 7.8 g/dL (ref 6.0–8.3)

## 2010-11-10 LAB — POCT CARDIAC MARKERS
CKMB, poc: <1 ng/mL — ABNORMAL LOW (ref 1.0–8.0)
CKMB, poc: <1 ng/mL — ABNORMAL LOW (ref 1.0–8.0)
Myoglobin, poc: 39.3 ng/mL (ref 12–200)
Myoglobin, poc: 57.7 ng/mL (ref 12–200)
Troponin i, poc: <0.05 ng/mL (ref 0.00–0.09)
Troponin i, poc: <0.05 ng/mL (ref 0.00–0.09)

## 2010-11-10 LAB — DIFFERENTIAL
Basophils Absolute: 0.3 10*3/uL — ABNORMAL HIGH (ref 0.0–0.1)
Basophils Relative: 3 % — ABNORMAL HIGH (ref 0–1)
Eosinophils Absolute: 0.3 10*3/uL (ref 0.0–0.7)
Eosinophils Relative: 3 % (ref 0–5)
Lymphocytes Relative: 25 % (ref 12–46)
Lymphs Abs: 2.8 10*3/uL (ref 0.7–4.0)
Monocytes Absolute: 0.7 10*3/uL (ref 0.1–1.0)
Monocytes Relative: 6 % (ref 3–12)
Neutro Abs: 7 10*3/uL (ref 1.7–7.7)
Neutrophils Relative %: 63 % (ref 43–77)

## 2010-11-10 LAB — URINALYSIS, ROUTINE W REFLEX MICROSCOPIC
Bilirubin Urine: NEGATIVE
Glucose, UA: NEGATIVE mg/dL
Hgb urine dipstick: NEGATIVE
Ketones, ur: NEGATIVE mg/dL
Nitrite: NEGATIVE
Protein, ur: NEGATIVE mg/dL
Specific Gravity, Urine: 1.013 (ref 1.005–1.030)
Urobilinogen, UA: 0.2 mg/dL (ref 0.0–1.0)
pH: 6.5 (ref 5.0–8.0)

## 2010-11-10 LAB — CBC
HCT: 44.5 % (ref 39.0–52.0)
Hemoglobin: 15.3 g/dL (ref 13.0–17.0)
MCH: 32.3 pg (ref 26.0–34.0)
MCHC: 34.5 g/dL (ref 30.0–36.0)
MCV: 93.8 fL (ref 78.0–100.0)
Platelets: 167 10*3/uL (ref 150–400)
RBC: 4.74 MIL/uL (ref 4.22–5.81)
RDW: 12.1 % (ref 11.5–15.5)
WBC: 11.1 10*3/uL — ABNORMAL HIGH (ref 4.0–10.5)

## 2010-11-10 LAB — LIPASE, BLOOD: Lipase: 58 U/L (ref 23–300)

## 2011-01-12 NOTE — Discharge Summary (Signed)
NAMERODDERICK, HOLTZER NO.:  [id number]   MEDICAL RECORD NO.:  [id number]          PATIENT TYPE:  INP   LOCATION:  0606                          FACILITY:  [facility]   PHYSICIAN:  Jasmine Pang, M.D. DATE OF BIRTH:  01/30/1956   DATE OF ADMISSION:  03/08/2008  DATE OF DISCHARGE:  03/09/2008                               DISCHARGE SUMMARY   IDENTIFICATION:  This is 55 year old married white male who was admitted  on a voluntary basis on March 08, 2008.   HISTORY OF PRESENT ILLNESS:  The patient has a history of depression.  He reports being stressed due to his economic problems.  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  He recently has made a statement indicating that his life was worse more  for the life insurance and his wife became concerned and talked to him  after coming to the hospital.  His deterrent to suicide is his face and  children.  He states he just got emotional and wanted to do that.  He  reports periodic alcohol use, 5 vodkas at a time.  He had this much  prior to his statement about suicide.   PAST PSYCHIATRIC HISTORY:  This is the first [facility] admission for the  patient.  The patient seen Dr. Evelene Croon for 6 years.  In the past, he has  been on Prozac for depression after his mother's death.  He has also  been on Cymbalta, Wellbutrin (which made him feel crazy).   FAMILY HISTORY:  He states his mother had a lot of psychiatric problems  though he could not be specific.   ALCOHOL AND DRUG ABUSE:  The patient smokes.  Alcohol is as above.  He  denies any drug use.   MEDICAL PROBLEMS:  Hepatitis C, GERD.   MEDICATIONS:  Klonopin 2 mg daily, Protonix 40 mg daily.   DRUG ALLERGIES:  Tetanus shot gets high fever.   PHYSICAL FINDINGS:  No acute physical or medical problems were noted.   DIAGNOSTIC STUDIES:  CBC was within normal limits.  Alcohol was 123.  UDS was positive for benzodiazepines.  BMET was within  normal limits.   HOSPITAL COURSE:  Upon admission, he was started on Librium 25 mg p.o.  q.6 h. p.r.n. withdrawal.  He was also started on Protonix 40 mg daily.  The Librium was discontinued and he was restarted on Klonopin 1 mg p.o.  b.i.d.  He was also started on Celexa 20 mg daily.  The patient  tolerated these medications well with no significant side effects.  In  individual sessions with me, the patient was friendly and cooperative.  He did have psychomotor retardation.  Speech was soft and slow.  He  participated appropriately in unit therapeutic groups and activities.  Therapeutic issues revolved around his stressors including financial  issues with the economy.  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  He denies he would hurt himself.  He has  children who deter him from doing this.  He has not been in any therapy  and states he would like to be in therapy.  On March 09, 2008, mental  status had improved markedly from admission status.  The patient's mood  was less depressed and anxious.  The patient had no suicidal or  homicidal ideation.  There were no thoughts of self-injurious behavior.  No auditory or visual hallucinations.  No paranoia or delusions.  Thoughts were logical and goal-directed.  Thought content, no  predominant theme.  Cognitive was grossly intact.  It was felt the  patient was safe for discharge today.  He will return to see Dr. Evelene Croon  and will start therapy with Dr. Arbutus Ped.   DISCHARGE DIAGNOSES:  Axis I:  Mood disorder, not otherwise specified;  rule out alcohol abuse.  Axis II:  None.  Axis III:  Hepatitis C.  Axis IV:  None (problems with primary support group, occupational  problem, burden of psychiatric illness).  Axis V:  Global assessment of functioning upon discharge was 55.  GAF  upon admission was 45.  GAF highest past year was 75-80.   DISCHARGE PLANS:  There was no specific activity level or dietary   restrictions.   POSTHOSPITAL CARE PLANS:  The patient will see Dr. Evelene Croon his psychiatrist  for followup therapy.  He will also see Dr. Arbutus Ped, counselor for  the therapy.   DISCHARGE MEDICATIONS:  1. Celexa 20 mg q. day.  2. Klonopin 1 mg twice daily as needed.  3. Protonix 40 mg daily.      Jasmine Pang, M.D.  Electronically Signed     BHS/MEDQ  D:  03/09/2008  T:  03/10/2008  Job:  [id number]

## 2011-01-15 NOTE — H&P (Signed)
[name], [name] NO.:  [id number]   MEDICAL RECORD NO.:  [id number]          PATIENT TYPE:  EMS   LOCATION:  ED                           FACILITY:  [facility]   PHYSICIAN:  Sherin Quarry, MD      DATE OF BIRTH:  May 07, 1956   DATE OF ADMISSION:  09/09/2005  DATE OF DISCHARGE:                                HISTORY & PHYSICAL   HISTORY OF PRESENT ILLNESS:  [name] is a pleasant 55 year old man whose  primary doctor is Dr. Joycelyn Rua. [name] indicates that normally he  does not consume alcohol very frequently. However, last night he went out  with his boss and they had perhaps four or five mixed drinks. He felt fine  when he went to bed. When he woke up this morning he was quite sick to his  stomach and vomited on a total of four occasions.  Each time he vomited  there was blood present in the vomitus. His wife understandably became  concerned and requested that he come to the emergency room he arrived at 10  o'clock this morning, at that time his hemoglobin was 16.3. His blood  pressure was 127/93,  pulse was 76, O2 saturation 100%. BUN was 20, creatinine was 1.2. A  nasogastric tube was placed and was aspirated and blood was present in his  stomach, therefore we were asked to come to see him. There has been no  further vomiting since he has been in the emergency room. He has had no  previous history of gastrointestinal problems. He indicates that Dr.  Kinnie Scales  did a colonoscopy in 2004.   PAST MEDICAL HISTORY:  1.  Significant for history of hepatitis C which was diagnosed in 17. The      patient states that Dr. Kinnie Scales  has performed liver biopsies on him in      the past, most recently 4 months ago, and advised that he does not      require any specific treatment for the hepatitis C.  2.  His past history is also remarkable for chronic anxiety for which she      sees Dr. Evelene Croon of the psychiatry department and is prescribed Klonopin on      a  regular basis.  3.  He indicates he smokes about one and a half packs of cigarettes per day.  4.  He states he does not abuse nonsteroidal and anti-inflammatory drugs.   MEDICATIONS:  Medications are Klonopin 1 mg one to three daily, Ambien 10 mg  at bedtime daily, Zantac p.r.n.   ALLERGIES:  TETANUS TOXOID.   OPERATIONS:  He had a shoulder operation in the past, has had liver biopsies  done in the past.   FAMILY HISTORY:  Notable for his mother who died of lung cancer. His father  died of congestive heart failure. He says his sister was found to have a  pulmonary nodule.   SOCIAL HISTORY:  Smokes 1-1/2 packs of cigarettes per day does not abuse  drugs. He seldom drinks alcohol, according to the patient.   REVIEW OF SYSTEMS:  HEAD:  He has a mild headache, ear, nose and throat.  Denies earache, sinus pain or sore throat. CHEST:  Denies coughing wheezing  or chest congestion. CARDIOVASCULAR:  Denies orthopnea, PND or ankle edema.  GI: See above.  GU: Denies dysuria or urinary frequency. NEURO:  There is no history of  seizure or stroke and denies excessive thirst, urinary frequency or  nocturia.   PHYSICAL EXAMINATION:  VITAL SIGNS:  Currently blood pressure is 143/94,  temperature is 98.3, pulse is 75, respirations 20, O2 saturation 98%.  HEENT:  Exam is within normal limits.  CHEST:  Clear.  BACK:  Examination the back reveals no CVA or point tenderness.  CARDIOVASCULAR:  Reveals normal S1, S2 without gross murmurs or gallops.  ABDOMEN:  Benign. Normal bowel sounds. No masses or tenderness. No guarding  or rebound.  NEUROLOGIC:  Testing examination extremities normal   IMPRESSION:  1.  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  2.  Chronic anxiety.  3.  Hepatitis C.  4.  A 50 pack-year smoking history.   PLAN:  Will make the patient n.p.o., administer IV fluids and IV Protonix.  The patient will have  serial H&H performed and will monitor this closely. I  attempted to contact Dr. Kinnie Scales f and that office advised [name] that he was not  available. I therefore contacted Dr. Evette Cristal of the [facility] gastroenterology  department. He indicated he would see the patient and proceed with endoscopy  if necessary.           ______________________________  Sherin Quarry, MD     SY/MEDQ  D:  09/09/2005  T:  09/09/2005  Job:  [id number]   cc:   Joycelyn Rua, M.D.  Fax: [phone]   Griffith Citron, M.D.  Fax: [phone]

## 2011-01-15 NOTE — Op Note (Signed)
NAMEJAMASON, PECKHAM NO.:  [id number]   MEDICAL RECORD NO.:  [id number]           PATIENT TYPE:   LOCATION:                                 FACILITY:   PHYSICIAN:  Petra Kuba, M.D.         DATE OF BIRTH:   DATE OF PROCEDURE:  09/10/2005  DATE OF DISCHARGE:                                 OPERATIVE REPORT   PROCEDURE:  Esophagogastroduodenoscopy.   INDICATIONS:  Upper GI bleeding. Consent was signed after risks, benefits,  methods, options thoroughly discussed, last night, Dr. Evette Cristal and me prior to  any sedation, this morning.   MEDICINES USED:  Demerol 100, Versed 12.   DESCRIPTION OF PROCEDURE:  The video endoscope was inserted by direct  vision.  The proximal mid esophagus was normal. The distal esophagus was a  small hiatal hernia; possibly a thin, small Mallory-Weiss tear.  No signs of  varices or other abnormalities. Scope passed into the stomach. No blood was  seen.  Advanced through a normal antrum, normal pylorus, into a normal  duodenal bulb, around the C-loop, to a normal second portion of the  duodenum. No blood was seen distally. The scope was withdrawn back to the  bulb and a good look there ruled out ulcer in that location.   The scope was withdrawn back into the stomach and retroflexed. Angularis,  cardia, fundus, lesser and greater curve were normal on retroflex  visualization. Straight visualization of the stomach did not reveal any  additional findings. Air was suctioned.  The scope was slowly withdrawn,  again, a good look at the esophagus confirmed the above findings. Other than  the probable thin, small Mallory-Weiss tear was without worrisome stigmata.  No other abnormalities were seen, but the small hiatal hernia. The scope was  removed. The patient tolerated the procedure well. There was no obvious  immediate complication.   ENDOSCOPIC DIAGNOSIS:  1.  Small hiatal hernia with questionable thin, small Mallory-Weiss tear at  the GE junction.  2.  Otherwise normal EGD without any blood being seen.   PLAN:  Slowly advance diet. No aspirin or nonsteroidals. Okay to go home  soon.  Might use once a day pump inhibitors or over-the-counter Prilosec.  Will ask Dr. Kinnie Scales and Dr. Lenise Arena to follow him up; and will be on standby  to help p.r.n.           ______________________________  Petra Kuba, M.D.     MEM/MEDQ  D:  09/10/2005  T:  09/10/2005  Job:  [id number]   cc:   Melissa L. Ladona Ridgel, MD   Griffith Citron, M.D.  Fax: [phone]   Joycelyn Rua, M.D.  Fax: [phone]

## 2011-01-15 NOTE — Consult Note (Signed)
[name], [name] NO.:  [id number]   MEDICAL RECORD NO.:  [id number]          PATIENT TYPE:  INP   LOCATION:  0104                         FACILITY:  [facility]   PHYSICIAN:  Graylin Shiver, M.D.   DATE OF BIRTH:  21-Oct-1955   DATE OF CONSULTATION:  09/09/2005  DATE OF DISCHARGE:                                   CONSULTATION   REASON FOR CONSULTATION:  Patient is a 55 year old male who is being  admitted to [facility] because of hematemesis.  The patient states  that he was out last night with friends and had a few drinks.  He woke up  this morning at about 5:30 a.m. and vomited blood.  He vomited several times  between 5:30 and 7:30.  He has not vomited since then.  It is currently  around 8 p.m.  The patient called Dr. Jennye Boroughs office, and they told him to  go to the hospital.  In the emergency room, an NG tube was placed, and his  stomach was lavaged.  The NG tube was removed.  Dr. Tresa Endo was called, who  saw the patient and is admitting him to the hospital.   ALLERGIES:  TETANUS.   PAST MEDICAL HISTORY:  History of hepatitis C.  Patient states that he has  had liver biopsies in the past, and he has no evidence of cirrhosis.  No  history of peptic ulcer disease.  Other medical problems include anxiety.   MEDICATIONS PRIOR TO ADMISSION:  Klonopin, Ambien.   REVIEW OF SYSTEMS:  He does not complain of chest pain or shortness of  breath.  He denies dizziness or lightheadedness.   SOCIAL HISTORY:  He does smoke.  He states that he does not drink very  often.   PHYSICAL EXAMINATION:  VITAL SIGNS:  Stable.  GENERAL:  He is in no acute distress.  He is sitting up in the room.  LUNGS:  Clear.  HEART:  Regular rhythm.  ABDOMEN:  Soft, nontender, except for some minimal epigastric tenderness to  deep palpation.  EXTREMITIES:  No edema.   LABS:  Hemoglobin 16.3.   IMPRESSION:  1.  Upper gastrointestinal bleed:  This could be secondary to  alcoholic      gastritis versus peptic ulcer disease versus esophagitis.  2.  History of hepatitis C.  Patient reports he has had prior liver biopsies      in the past and does not have cirrhosis.   PLAN:  Patient is being admitted to the hospital.  He will receive IV  Protonix, and we will follow his H&H's.  We will schedule him for an EGD  tomorrow.           ______________________________  Graylin Shiver, M.D.     SFG/MEDQ  D:  09/09/2005  T:  09/10/2005  Job:  [id number]

## 2011-01-15 NOTE — Discharge Summary (Signed)
NAMETYREECE, GELLES              ACCOUNT NO.:  [id number]   MEDICAL RECORD NO.:  [id number]          PATIENT TYPE:  INP   LOCATION:  1610                         FACILITY:  [facility]   PHYSICIAN:  Melissa L. Ladona Ridgel, MD  DATE OF BIRTH:  September 21, 1955   DATE OF ADMISSION:  09/09/2005  DATE OF DISCHARGE:  09/10/2005                                 DISCHARGE SUMMARY   ADMISSION DIAGNOSES:  Nausea and vomiting with hematemesis.   DISCHARGE DIAGNOSES:  1.  Hematemesis.  Patient was evaluated with EGD, which showed no obvious      bleeding lesion or any residual blood.  Some areas of irritation were      noted in the context of a hiatal hernia.  Patient has been noted to      tolerate full food and still has a sense of epigastric discomfort, which      he initially could not rate, but then rated to his nurses 4/10.  It      appears to be musculoskeletal in nature.  The patient had a long talk      about avoiding foods and behaviors that could irritate his stomach,      namely drinking alcohol and smoking cigarettes, spicy food, and dietary      indiscretions, eating at night, or using nonsteroidal anti-inflammatory      medications.  The patient expressed understanding of my conversation      with him but remained perplexed by his symptomatology and states it is      the first time he ever vomited blood.  I had an extensive conversation      with he and his wife regarding the multifaceted nature of gastric      irritation and the need to take it slowly and move his diet up slowly as      well as avoid things that could be irritating.  I also stressed that he      should follow up with Dr. Kinnie Scales for further workup and evaluation.  2.  Anxiety, treated by Dr. Evelene Croon.  The patient will remain on his home dose      of Klonopin as well as Ambien, and we will provide him with      prescriptions for this medication.  3.  Pain medication:  At this time, I have expressed to the patient that      Tylenol  is probably the best medication for his musculoskeletal pain.  I      was not impressed at the level of pain which he expressed to me was      appropriate for opiate analgesia; however, I will provide him with a      small number of tablets of Ultram, and he will need followup with Dr.      Izola Price or Dr. Kinnie Scales for further medication.  If the patient follows      appropriate precautionary procedures, namely avoiding the situations      that we discussed, I believe that in a couple of days, he will feel      quite well.  4.  History of hepatitis C:  This will be followed by Dr. Kinnie Scales.  5.  Tobacco abuse:  Patient has been encouraged to discontinue his cigarette      smoking, as this contribute to irritation.  The patient has been      instructed to specifically avoid aspirin, Motrin, and all other      nonsteroidal anti-inflammatory medications.   DISCHARGE MEDICATIONS:  1.  Ultram 50 mg p.o. q.4-6h. p.r.n. pain, a limited number of 15.  2.  Protonix 40 mg once daily.  3.  Phenergan 25 mg p.o. every 4-6 hours p.r.n. nausea.  4.  Patient can resume the home doses of Klonopin and Ambien.  He will not      be provided with prescriptions.   HISTORY OF PRESENT ILLNESS:  Patient is a 55 year old white male with a past  medical history for hepatitis C diagnosed in 1991.  He had been following  with Dr. Kinnie Scales.  Patient states that he went out to have a couple of drinks  with his boss on the night prior to admission.  He went back home, went to  bed, woke up in the morning and had several episodes of vomiting, which had  blood in the vomitus.  The patient came to the emergency room for further  evaluation and was noted to be hemodynamically stable.  A nasogastric tube  was placed and did show that he did have some elements of blood in his  stomach.  The patient was admitted to the general medical floor.  He had no  further vomiting and underwent gastrointestinal evaluation with GI  physicians.   It was determined that an EGD would be appropriate, which he  did undergo.  No at-risk lesions were noted.  There was some irritation, and  a hiatal hernia is noted.  The recommendation for use of proton pump  inhibitor daily was made, and followup with Dr. Kinnie Scales was also recommended.  On the evening after his procedure, the patient was seen and evaluated by  me.  He remains hemodynamically stable.  He was able to tolerate his lunch,  dinner, and some snacks, although he does still have some epigastric  tenderness and is afraid that if he goes home, he is going to have pain  again.  We discussed the types of pain and which pain medications are more  appropriate for each type.  At this time, I do not feel that the patient  requires IV pain medications nor that he requires high dose outpatient  narcotic pain medication.  In discussing his treatment, I have elected to  provide him with Phenergan for nausea and Ultram p.o. for pain medications  in a limited amount.  Patient can follow up with Dr. Kinnie Scales for Dr. Izola Price  for further evaluation of pain if it is beyond the level of the medication  to treat.   At this time, the patient is elected to be discharged to home.  Stated he  did tolerate a soft diet, and I have advised him to please just advance this  diet slowly and not to overdo.   His pertinent laboratory values during the course of the hospital stay  reveals a hemoglobin at 11:45 on the day of discharge reveals 13.9 and 40.5.  His liver functions were within normal limits.  His BUN was 18, creatinine  1.3, total bilirubin slightly elevated at 1.2.  Hemoglobin 13.5, hematocrit  39.9 with an MCV of 92.7.  Urinalysis remains  negative as a source for  nausea and vomiting.  His amylase and lipase were within normal limits, not  pointing to pancreatitis.   At this time, the patient seems stable for discharge, to follow up with Dr. Kinnie Scales as an outpatient and Dr. Lenise Arena, as he will be  provided for  medication for his pain as well as this nausea.  He is instructed to go  slowly with his diet and follow up or call should he have any further  difficulty.      Melissa L. Ladona Ridgel, MD  Electronically Signed     MLT/MEDQ  D:  09/10/2005  T:  09/12/2005  Job:  [id number]

## 2011-05-16 IMAGING — CT CT ABDOMEN WO/W CM
2 of 8 series · 13 of 36 positions shown, 19 images · IV contrast (OMNI 350, WATER & [ID] OMNI 300)
Comparison: Ultrasound dated 07/17/2008.

CLINICAL DATA: Hepatitis C diagnosed 20 years ago.  Abdominal pain.

CT ABDOMEN WITHOUT AND WITH CONTRAST
TECHNIQUE: Multidetector CT imaging of the abdomen was performed
following the standard protocol before and during bolus
administration of intravenous contrast.
Contrast: 100 ml Zmnipaque-JBB

[Series 4: arterial, portal venous · axial · arterial · 0.70mm/px · z∈[-211,-6]mm · 9 of 201 slices shown, 15 images]
[im 21/201  soft-tissue]
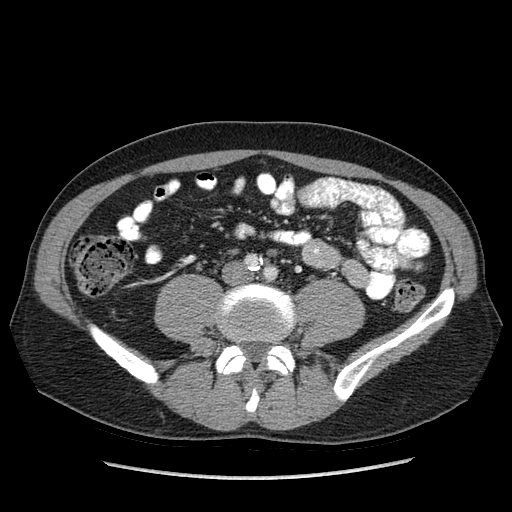
[im 21/201  bone]
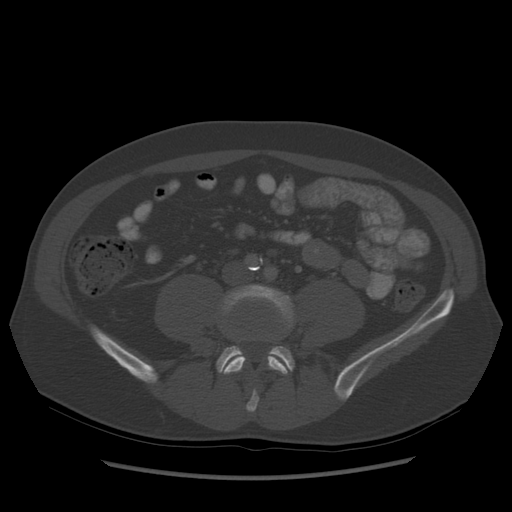
[im 41/201  soft-tissue]
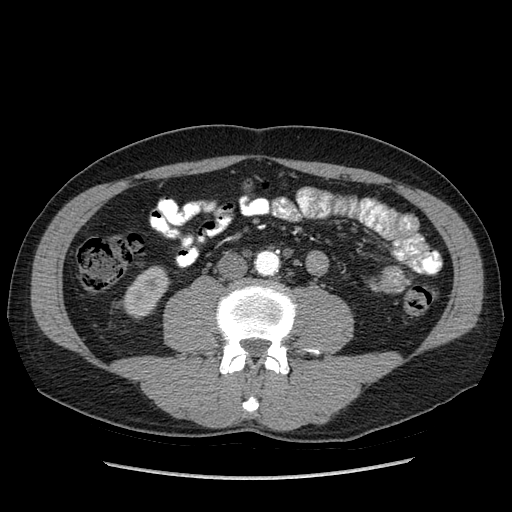
[im 61/201  soft-tissue]
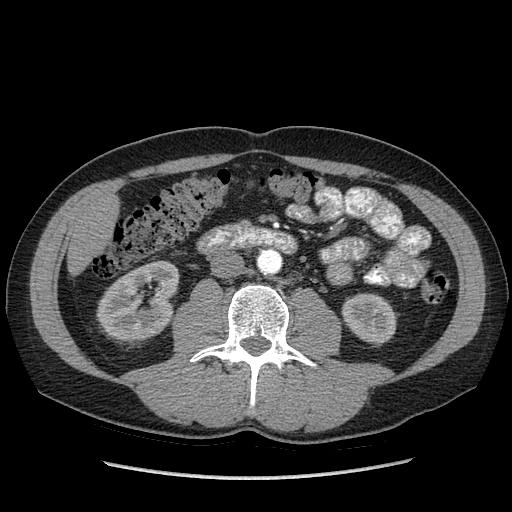
[im 81/201  soft-tissue]
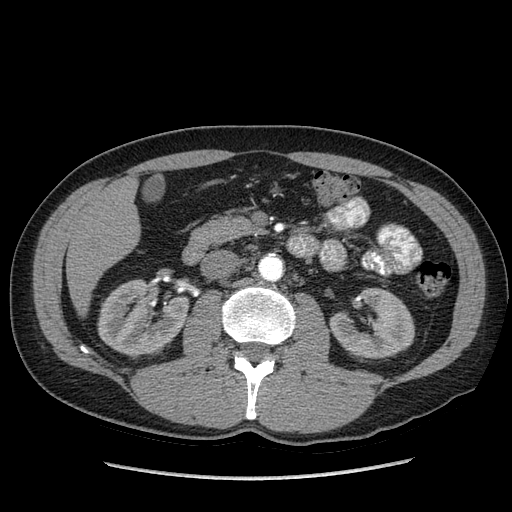
[im 101/201  soft-tissue]
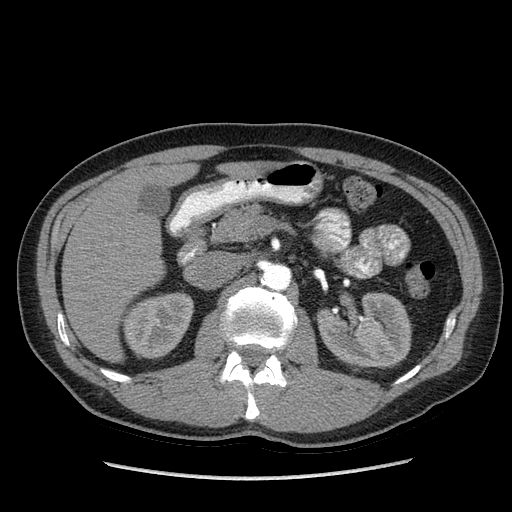
[im 121/201  soft-tissue]
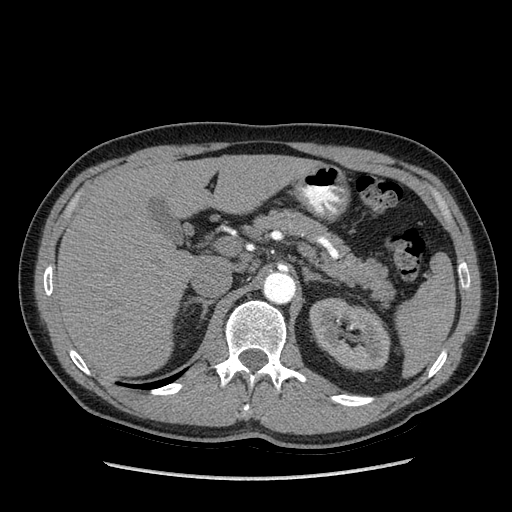
[im 121/201  lung]
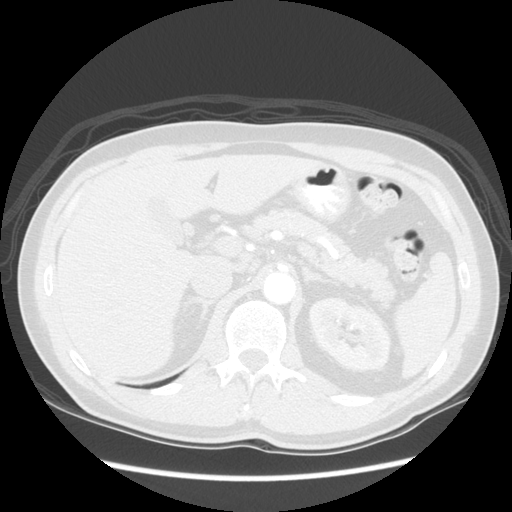
[im 141/201  soft-tissue]
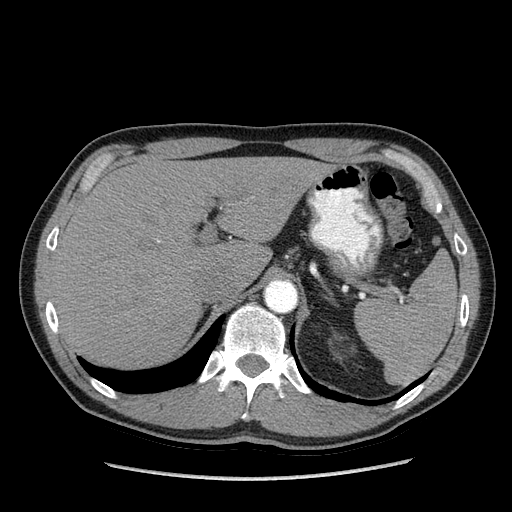
[im 141/201  lung]
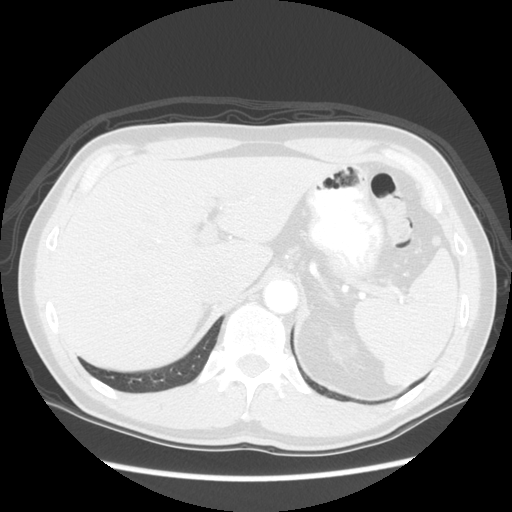
[im 161/201  soft-tissue]
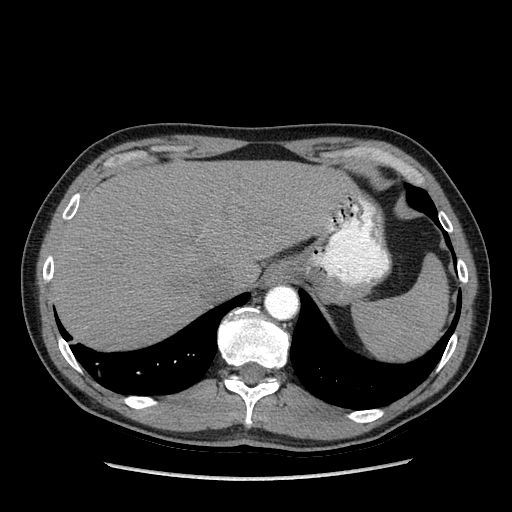
[im 161/201  lung]
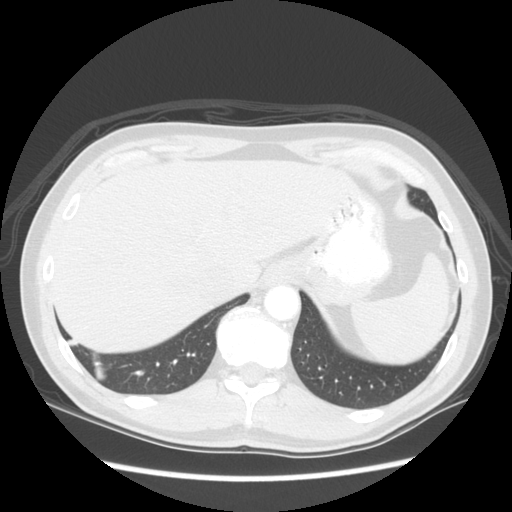
[im 181/201  soft-tissue]
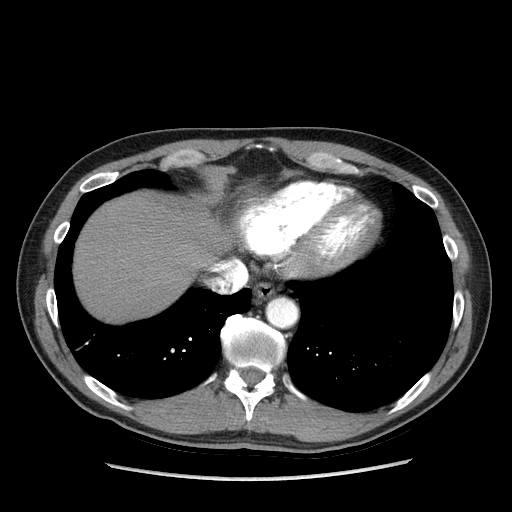
[im 181/201  lung]
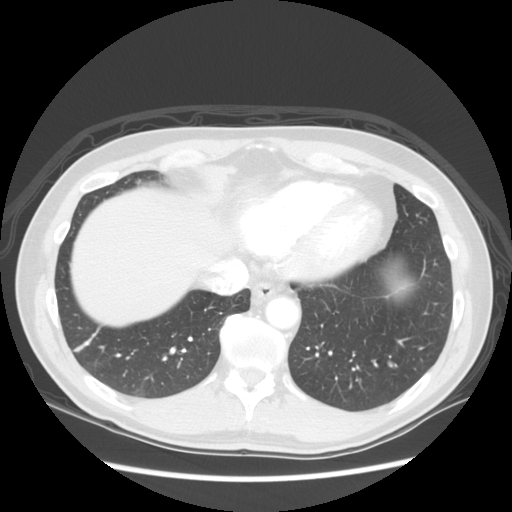
[im 181/201  bone]
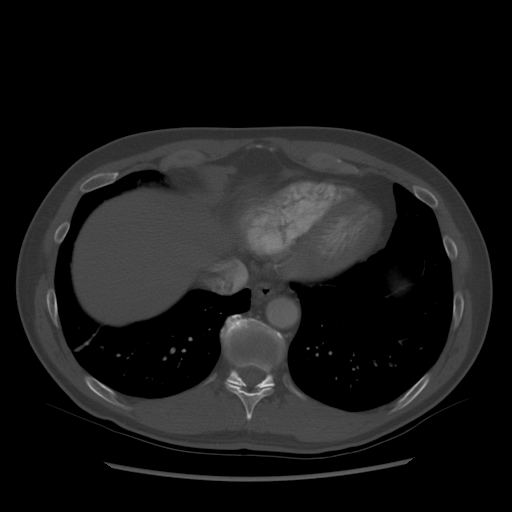

[Series 602: sagittal body · sagittal · 0.70mm/px · 4 of 140 slices shown]
[im 20/140  soft-tissue]
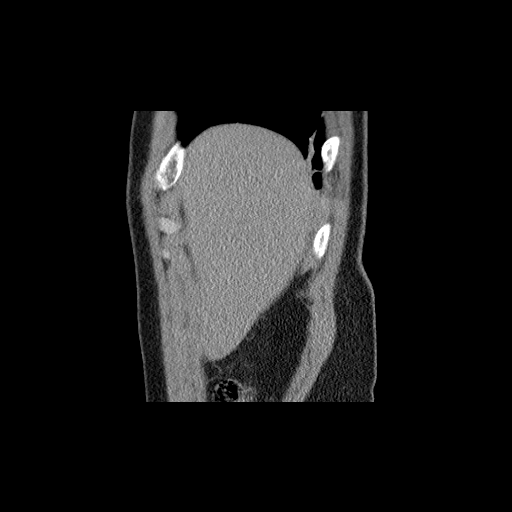
[im 40/140  soft-tissue]
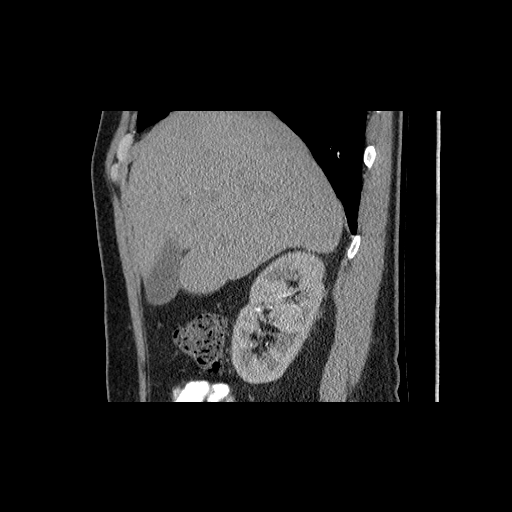
[im 60/140  soft-tissue]
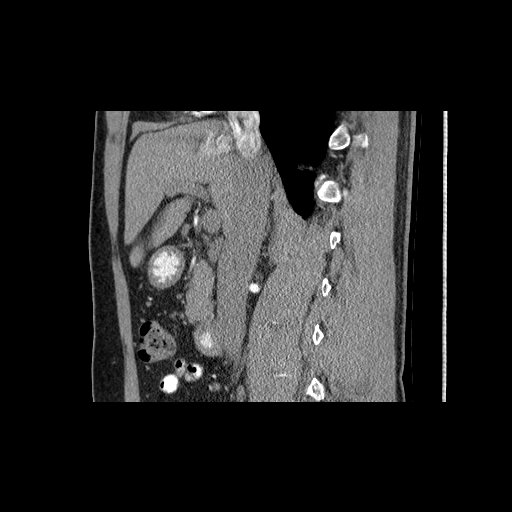
[im 80/140  soft-tissue]
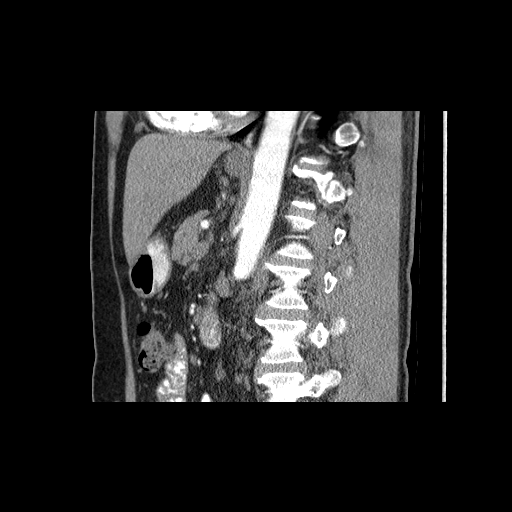

[13 of 36 positions shown; findings below may reference images not displayed]

FINDINGS: Normal appearing liver, spleen, pancreas, gallbladder,
adrenal glands and right kidney.  Small cyst in the mid left
kidney.  No gastrointestinal abnormalities or enlarged lymph nodes.
No liver masses demonstrated.  Small accessory splenule inferior to
the spleen.  Clear lung bases.  Mild lumbar and lower thoracic
spine degenerative changes.
IMPRESSION: No acute abnormality.  Normal appearing liver.

## 2011-05-27 LAB — POCT CARDIAC MARKERS
CKMB, poc: <1 — ABNORMAL LOW
CKMB, poc: <1 — ABNORMAL LOW
Myoglobin, poc: 41.7
Myoglobin, poc: 47.9
Operator id: 146091
Operator id: 282201
Troponin i, poc: <0.05
Troponin i, poc: <0.05

## 2011-05-27 LAB — DIFFERENTIAL
Basophils Absolute: 0
Basophils Absolute: 0.3 — ABNORMAL HIGH
Basophils Relative: 1
Basophils Relative: 3 — ABNORMAL HIGH
Eosinophils Absolute: 0.1
Eosinophils Absolute: 0.3
Eosinophils Relative: 1
Eosinophils Relative: 3
Lymphocytes Relative: 33
Lymphocytes Relative: 37
Lymphs Abs: 2.5
Lymphs Abs: 2.8
Monocytes Absolute: 0.5
Monocytes Absolute: 0.4
Monocytes Relative: 7
Monocytes Relative: 5
Neutro Abs: 4.5
Neutro Abs: 3.9
Neutrophils Relative %: 59
Neutrophils Relative %: 51

## 2011-05-27 LAB — COMPREHENSIVE METABOLIC PANEL
ALT: 44
AST: 31
Albumin: 4.2
Alkaline Phosphatase: 39
BUN: 8
CO2: 26
Calcium: 9.5
Chloride: 106
Creatinine, Ser: 0.98
GFR calc Af Amer: >60
GFR calc non Af Amer: >60
Glucose, Bld: 84
Potassium: 3.9
Sodium: 139
Total Bilirubin: 1.1
Total Protein: 6.7

## 2011-05-27 LAB — CBC
HCT: 45
HCT: 48.7
Hemoglobin: 15.3
Hemoglobin: 16.5
MCHC: 34.1
MCHC: 33.8
MCV: 93.5
MCV: 91.7
Platelets: 157
Platelets: 159
RBC: 4.81
RBC: 5.31
RDW: 12.8
RDW: 12.2
WBC: 7.6
WBC: 7.7

## 2011-05-27 LAB — LIPASE, BLOOD: Lipase: 14

## 2011-05-27 LAB — BASIC METABOLIC PANEL
BUN: 14
CO2: 26
Calcium: 10.1
Chloride: 103
Creatinine, Ser: 1
GFR calc Af Amer: >60
GFR calc non Af Amer: >60
Glucose, Bld: 98
Potassium: 4.1
Sodium: 141

## 2011-05-27 LAB — RAPID URINE DRUG SCREEN, HOSP PERFORMED
Amphetamines: NOT DETECTED
Barbiturates: NOT DETECTED
Benzodiazepines: POSITIVE — AB
Cocaine: NOT DETECTED
Opiates: NOT DETECTED
Tetrahydrocannabinol: NOT DETECTED

## 2011-05-27 LAB — APTT: aPTT: 35

## 2011-05-27 LAB — ETHANOL: Alcohol, Ethyl (B): 123 — ABNORMAL HIGH

## 2011-05-27 LAB — PROTIME-INR
INR: 1
Prothrombin Time: 12.9

## 2011-08-07 ENCOUNTER — Encounter (HOSPITAL_BASED_OUTPATIENT_CLINIC_OR_DEPARTMENT_OTHER): Payer: Self-pay | Admitting: Emergency Medicine

## 2011-08-07 ENCOUNTER — Emergency Department (INDEPENDENT_AMBULATORY_CARE_PROVIDER_SITE_OTHER): Payer: BC Managed Care – PPO

## 2011-08-07 ENCOUNTER — Emergency Department (HOSPITAL_BASED_OUTPATIENT_CLINIC_OR_DEPARTMENT_OTHER)
Admission: EM | Admit: 2011-08-07 | Discharge: 2011-08-07 | Disposition: A | Discharge status: Home / Self Care | Payer: BC Managed Care – PPO | Attending: Emergency Medicine | Admitting: Emergency Medicine

## 2011-08-07 DIAGNOSIS — K625 Hemorrhage of anus and rectum: Secondary | ICD-10-CM | POA: Insufficient documentation

## 2011-08-07 DIAGNOSIS — K922 Gastrointestinal hemorrhage, unspecified: Secondary | ICD-10-CM

## 2011-08-07 DIAGNOSIS — F17200 Nicotine dependence, unspecified, uncomplicated: Secondary | ICD-10-CM | POA: Insufficient documentation

## 2011-08-07 DIAGNOSIS — R109 Unspecified abdominal pain: Secondary | ICD-10-CM | POA: Insufficient documentation

## 2011-08-07 DIAGNOSIS — IMO0001 Reserved for inherently not codable concepts without codable children: Secondary | ICD-10-CM | POA: Insufficient documentation

## 2011-08-07 DIAGNOSIS — R110 Nausea: Secondary | ICD-10-CM

## 2011-08-07 LAB — DIFFERENTIAL
Basophils Absolute: 0 10*3/uL (ref 0.0–0.1)
Basophils Relative: 1 % (ref 0–1)
Eosinophils Absolute: 0.3 10*3/uL (ref 0.0–0.7)
Eosinophils Relative: 4 % (ref 0–5)
Lymphocytes Relative: 30 % (ref 12–46)
Lymphs Abs: 1.9 10*3/uL (ref 0.7–4.0)
Monocytes Absolute: 0.4 10*3/uL (ref 0.1–1.0)
Monocytes Relative: 7 % (ref 3–12)
Neutro Abs: 3.6 10*3/uL (ref 1.7–7.7)
Neutrophils Relative %: 58 % (ref 43–77)

## 2011-08-07 LAB — BASIC METABOLIC PANEL
BUN: 16 mg/dL (ref 6–23)
CO2: 31 mEq/L (ref 19–32)
Calcium: 9.9 mg/dL (ref 8.4–10.5)
Chloride: 103 mEq/L (ref 96–112)
Creatinine, Ser: 0.9 mg/dL (ref 0.50–1.35)
GFR calc Af Amer: >90 mL/min (ref >90)
GFR calc non Af Amer: >90 mL/min (ref >90)
Glucose, Bld: 89 mg/dL (ref 70–99)
Potassium: 4.4 mEq/L (ref 3.5–5.1)
Sodium: 140 mEq/L (ref 135–145)

## 2011-08-07 LAB — CBC
HCT: 45.5 % (ref 39.0–52.0)
Hemoglobin: 15.4 g/dL (ref 13.0–17.0)
MCH: 30.9 pg (ref 26.0–34.0)
MCHC: 33.8 g/dL (ref 30.0–36.0)
MCV: 91.4 fL (ref 78.0–100.0)
Platelets: ADEQUATE 10*3/uL (ref 150–400)
RBC: 4.98 MIL/uL (ref 4.22–5.81)
RDW: 12.8 % (ref 11.5–15.5)
WBC: 6.2 10*3/uL (ref 4.0–10.5)

## 2011-08-07 MED ORDER — HYDROCODONE-ACETAMINOPHEN 5-325 MG PO TABS: 1.0000 | ORAL_TABLET | ORAL | Status: AC | PRN

## 2011-08-07 MED ORDER — IOHEXOL 300 MG/ML  SOLN
100.0000 mL | Freq: Once | INTRAMUSCULAR | Status: AC | PRN
  Administered 2011-08-07: 100 mL via INTRAVENOUS

## 2011-08-07 MED ORDER — ONDANSETRON HCL 4 MG/2ML IJ SOLN
4.0000 mg | Freq: Once | INTRAMUSCULAR | Status: AC
  Administered 2011-08-07: 4 mg via INTRAVENOUS
  Filled 2011-08-07: qty 2

## 2011-08-07 MED ORDER — ONDANSETRON HCL 4 MG PO TABS: 4.0000 mg | ORAL_TABLET | Freq: Four times a day (QID) | ORAL | Status: AC

## 2011-08-07 MED ORDER — HYDROMORPHONE HCL PF 1 MG/ML IJ SOLN
1.0000 mg | Freq: Once | INTRAMUSCULAR | Status: AC
  Administered 2011-08-07: 1 mg via INTRAVENOUS
  Filled 2011-08-07: qty 1

## 2011-08-07 MED ORDER — SODIUM CHLORIDE 0.9 % IV BOLUS (SEPSIS)
1000.0000 mL | Freq: Once | INTRAVENOUS | Status: AC
  Administered 2011-08-07: 1000 mL via INTRAVENOUS

## 2011-08-07 MED ORDER — FENTANYL CITRATE 0.05 MG/ML IJ SOLN
50.0000 ug | Freq: Once | INTRAMUSCULAR | Status: AC
  Administered 2011-08-07: 50 ug via INTRAVENOUS
  Filled 2011-08-07: qty 2

## 2011-08-07 NOTE — ED Provider Notes (Signed)
History     CSN: [id number] Arrival date & time: 08/07/2011  9:46 AM   First MD Initiated Contact with Patient 08/07/11 [phone]      Chief Complaint  Patient presents with  . Abdominal Pain  . Rectal Bleeding    (Consider location/radiation/quality/duration/timing/severity/associated sxs/prior treatment) HPI Comments: Patient presents with intermittent rectal bleeding over the course of the week.  Initially the patient noted that was painless bleeding.  Today he's had some onset of lower abdominal pain associated with the bleeding.  The bleeding is only with bowel movements.  He notes that he does have some streaks of blood mixed with the stool.  He denies prior abdominal surgeries.  No fevers.  Patient has some mild nausea.  No prior GI bleed history.  No coffee ground or bloody emesis.  No report of melena at home.  Patient is a 55 y.o. male presenting with abdominal pain and hematochezia. The history is provided by the patient.  Abdominal Pain The primary symptoms of the illness include abdominal pain, nausea and hematochezia. The primary symptoms of the illness do not include fever, shortness of breath or vomiting. The current episode started more than 2 days ago. The onset of the illness was gradual. The problem has not changed since onset. Symptoms associated with the illness do not include chills, hematuria or back pain.  Rectal Bleeding  Associated symptoms include abdominal pain and nausea. Pertinent negatives include no fever, no vomiting, no hematuria, no chest pain, no headaches, no coughing and no rash.    Past Medical History  Diagnosis Date  . Fibromyalgia     History reviewed. No pertinent past surgical history.  History reviewed. No pertinent family history.  History  Substance Use Topics  . Smoking status: Current Everyday Smoker -- 1.0 packs/day    Types: Cigarettes  . Smokeless tobacco: Not on file  . Alcohol Use: Yes     occasional      Review of Systems   Constitutional: Negative.  Negative for fever and chills.  HENT: Negative.   Eyes: Negative.  Negative for discharge and redness.  Respiratory: Negative.  Negative for cough and shortness of breath.   Cardiovascular: Negative.  Negative for chest pain.  Gastrointestinal: Positive for nausea, abdominal pain, blood in stool and hematochezia. Negative for vomiting.  Genitourinary: Negative.  Negative for hematuria.  Musculoskeletal: Negative.  Negative for back pain.  Skin: Negative.  Negative for color change and rash.  Neurological: Negative for syncope and headaches.  Hematological: Negative.  Negative for adenopathy.  Psychiatric/Behavioral: Negative.  Negative for confusion.  All other systems reviewed and are negative.    Allergies  Tetanus toxoids  Home Medications   Current Outpatient Rx  Name Route Sig Dispense Refill  . CHLORDIAZEPOXIDE HCL 10 MG PO CAPS Oral Take 10 mg by mouth 4 (four) times daily as needed.      . CYCLOBENZAPRINE HCL 10 MG PO TABS Oral Take 10 mg by mouth at bedtime.      . DULOXETINE HCL 60 MG PO CPEP Oral Take 60 mg by mouth daily.      Marland Kitchen PANTOPRAZOLE SODIUM 40 MG PO TBEC Oral Take 40 mg by mouth daily as needed.        BP 132/81  Pulse 66  Temp(Src) 98.7 F (37.1 C) (Oral)  Resp 16  Ht 5\' 8"  (1.727 m)  Wt 175 lb (79.379 kg)  BMI 26.61 kg/m2  SpO2 100%  Physical Exam  Constitutional: He is oriented  to person, place, and time. He appears well-developed and well-nourished.  Non-toxic appearance. He does not have a sickly appearance.  HENT:  Head: Normocephalic and atraumatic.  Eyes: Conjunctivae, EOM and lids are normal. Pupils are equal, round, and reactive to light.  Neck: Trachea normal, normal range of motion and full passive range of motion without pain. Neck supple.  Cardiovascular: Normal rate, regular rhythm and normal heart sounds.   Pulmonary/Chest: Effort normal and breath sounds normal. No respiratory distress.  Abdominal: Soft.  Normal appearance. He exhibits no distension. There is no tenderness. There is no rebound and no CVA tenderness.  Genitourinary:       Normal rectal tone.  Patient has scant amount of blood present on rectal exam.  No palpable hemorrhoids.  Hemoccult is positive.  No melena.  Musculoskeletal: Normal range of motion.  Neurological: He is alert and oriented to person, place, and time. He has normal strength.  Skin: Skin is warm, dry and intact. No rash noted.  Psychiatric: He has a normal mood and affect. His behavior is normal. Judgment and thought content normal.    ED Course  Procedures (including critical care time)   Labs Reviewed  CBC  DIFFERENTIAL  BASIC METABOLIC PANEL   Ct Abdomen Pelvis W Contrast  08/07/2011  *RADIOLOGY REPORT*  Clinical Data: Lower abdominal pain.  Lower GI bleeding.  Nausea.  CT ABDOMEN AND PELVIS WITH CONTRAST  Technique:  Multidetector CT imaging of the abdomen and pelvis was performed following the standard protocol during bolus administration of intravenous contrast.  Contrast: OMNIPAQUE IOHEXOL 300 MG/ML IV SOLN  Comparison: 10/28/2010  Findings: The abdominal parenchymal organs are normal in appearance.  Gallbladder is unremarkable.  No evidence of hydronephrosis.  No soft tissue masses or lymphadenopathy identified within the abdomen or pelvis.  There is no evidence of inflammatory process or abnormal fluid collections.  No evidence of bowel wall thickening or dilatation. Normal appendix is visualized.  IMPRESSION: Negative.  No acute findings or other significant abnormality identified.  Original Report Authenticated By: Danae Orleans, M.D.     No diagnosis found.    MDM  Patient with no acute signs of diverticulitis or colitis on his CT scan.  No signs of perforation.  Patient has a small amount of bleeding noticed intermittently over the course of the week only with bowel movements.  He has a normal hemoglobin in normal vital signs here and is  without persistent bleeding here.  I was the patient is safe for discharge home he does have a GI physician Dr. Santa Genera whom he can followup later this week.  He also understands that he should return for worsening pain or increasing bleeding or feeling lightheaded.        Nat Christen, MD 08/07/11 1240

## 2011-08-07 NOTE — ED Notes (Signed)
Abdominal pain with some rectal bleeding this week, noted more blood in stool this am.  Some nausea.  No known fever.  Pt relates he is having some weakness today.  No resp distress noted.  No SOB.

## 2011-11-10 ENCOUNTER — Emergency Department (HOSPITAL_BASED_OUTPATIENT_CLINIC_OR_DEPARTMENT_OTHER)
Admission: EM | Admit: 2011-11-10 | Discharge: 2011-11-10 | Disposition: A | Discharge status: Home / Self Care | Payer: BC Managed Care – PPO | Attending: Emergency Medicine | Admitting: Emergency Medicine

## 2011-11-10 ENCOUNTER — Emergency Department (INDEPENDENT_AMBULATORY_CARE_PROVIDER_SITE_OTHER): Payer: BC Managed Care – PPO

## 2011-11-10 ENCOUNTER — Encounter (HOSPITAL_BASED_OUTPATIENT_CLINIC_OR_DEPARTMENT_OTHER): Payer: Self-pay

## 2011-11-10 DIAGNOSIS — F17200 Nicotine dependence, unspecified, uncomplicated: Secondary | ICD-10-CM | POA: Insufficient documentation

## 2011-11-10 DIAGNOSIS — Z79899 Other long term (current) drug therapy: Secondary | ICD-10-CM | POA: Insufficient documentation

## 2011-11-10 DIAGNOSIS — M542 Cervicalgia: Secondary | ICD-10-CM | POA: Insufficient documentation

## 2011-11-10 DIAGNOSIS — F411 Generalized anxiety disorder: Secondary | ICD-10-CM | POA: Insufficient documentation

## 2011-11-10 DIAGNOSIS — IMO0001 Reserved for inherently not codable concepts without codable children: Secondary | ICD-10-CM | POA: Insufficient documentation

## 2011-11-10 DIAGNOSIS — S139XXA Sprain of joints and ligaments of unspecified parts of neck, initial encounter: Secondary | ICD-10-CM | POA: Insufficient documentation

## 2011-11-10 DIAGNOSIS — S161XXA Strain of muscle, fascia and tendon at neck level, initial encounter: Secondary | ICD-10-CM

## 2011-11-10 HISTORY — DX: Anxiety disorder, unspecified: F41.9

## 2011-11-10 MED ORDER — IBUPROFEN 600 MG PO TABS: 600.0000 mg | ORAL_TABLET | Freq: Four times a day (QID) | ORAL | Status: AC | PRN

## 2011-11-10 MED ORDER — OXYCODONE-ACETAMINOPHEN 5-325 MG PO TABS: 1.0000 | ORAL_TABLET | Freq: Four times a day (QID) | ORAL | Status: AC | PRN
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
## 2011-11-10 NOTE — Discharge Instructions (Signed)
Return to the ED with any concerns including worsening pain, weakness of your arms, fever, or any other alarming symptoms.

## 2012-02-19 IMAGING — CT CT ABD-PELV W/ CM
2 of 5 series · 17 of 46 positions shown, 19 images · IV contrast (agent unspecified)
Comparison: CT 07/17/2009

CLINICAL DATA: Right lower quadrant pain.  Rule out appendicitis

CT ABDOMEN AND PELVIS WITH CONTRAST
TECHNIQUE: Multidetector CT imaging of the abdomen and pelvis was
performed following the standard protocol during bolus
administration of intravenous contrast.
Contrast: 100 ml Qmnipaque-IKK IV

[Series 2: abdomen w/ · axial · 0.70mm/px · z∈[-400,-40]mm · 14 of 82 slices shown, 16 images]
[im 5/82  soft-tissue]
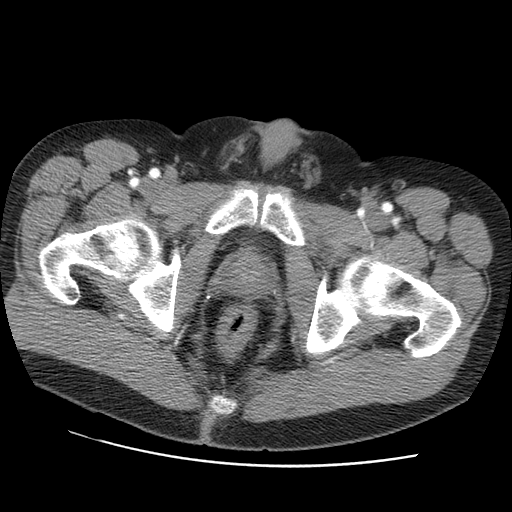
[im 5/82  bone]
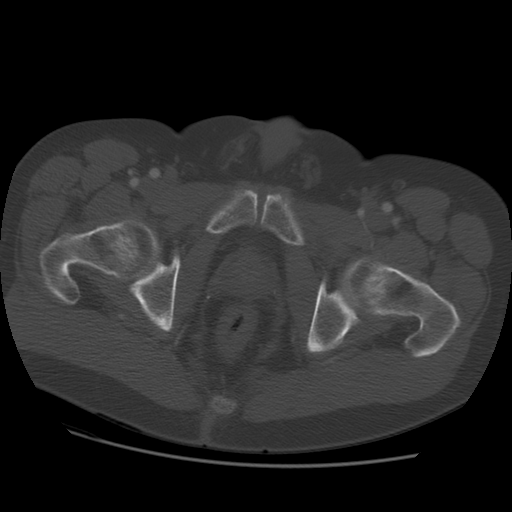
[im 10/82  soft-tissue]
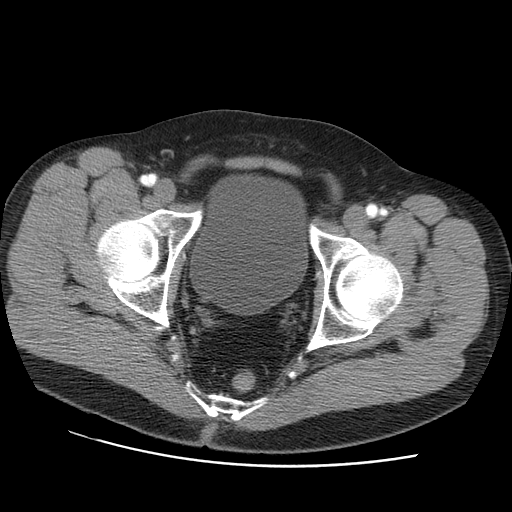
[im 19/82  soft-tissue]
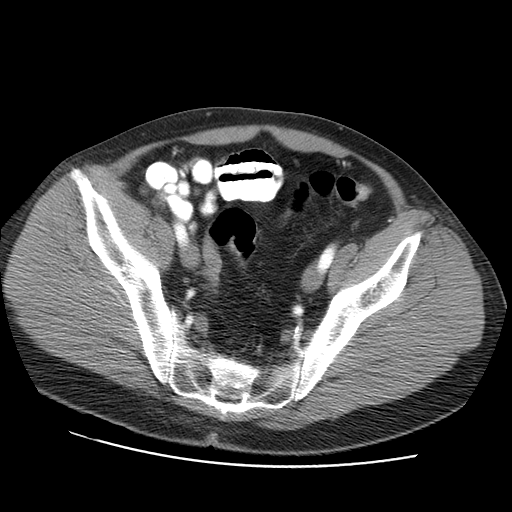
[im 23/82  soft-tissue]
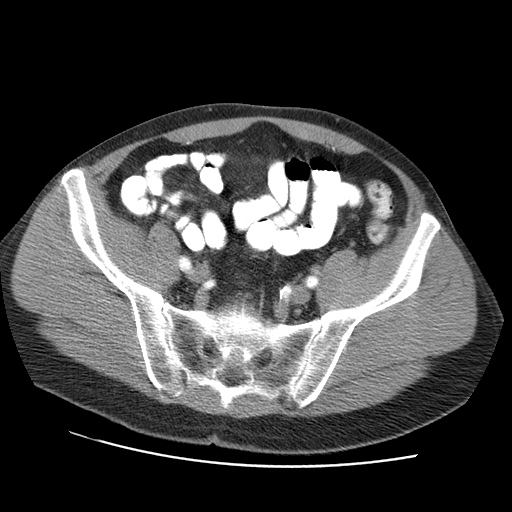
[im 28/82  soft-tissue]
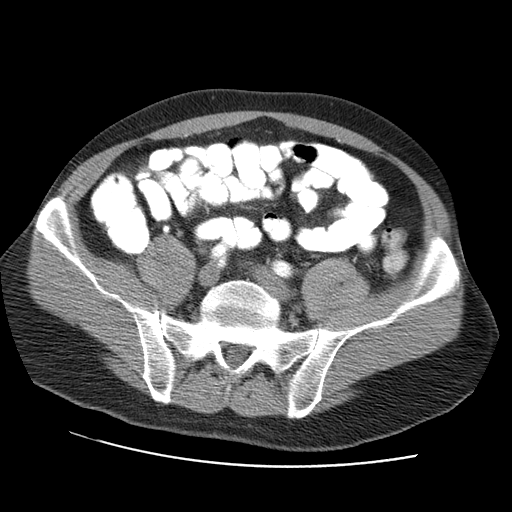
[im 32/82  soft-tissue]
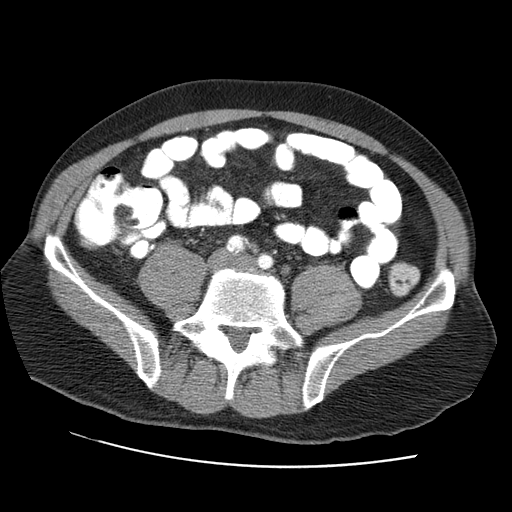
[im 37/82  soft-tissue]
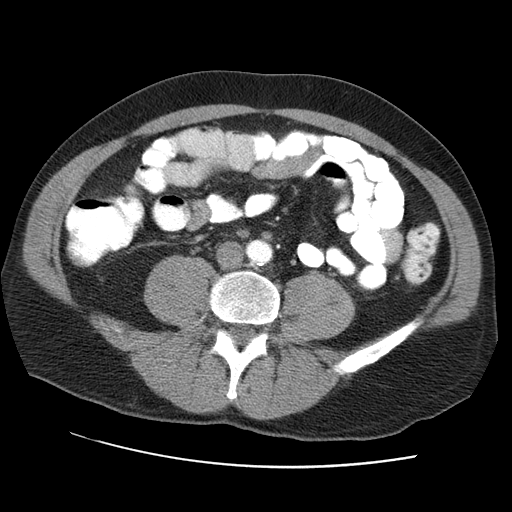
[im 46/82  soft-tissue]
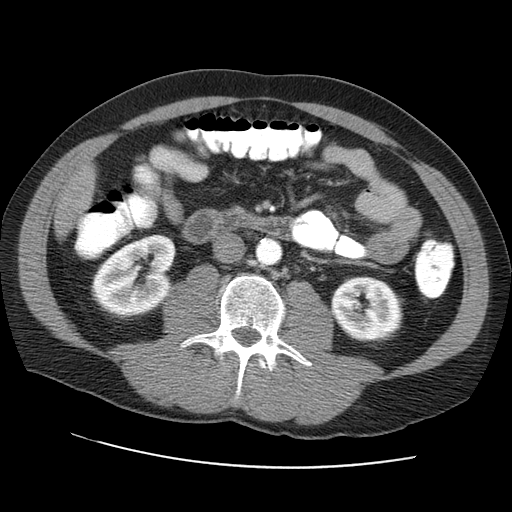
[im 50/82  soft-tissue]
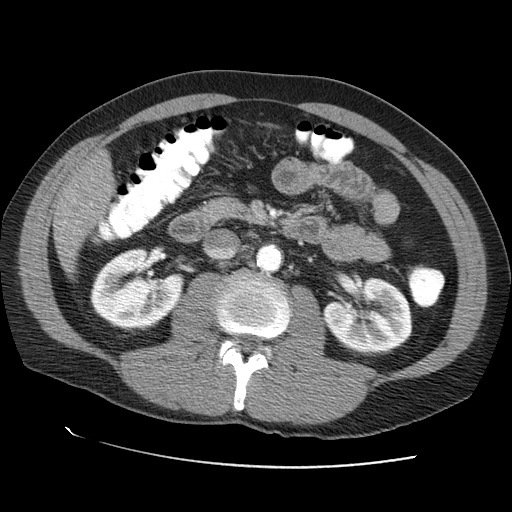
[im 50/82  bone]
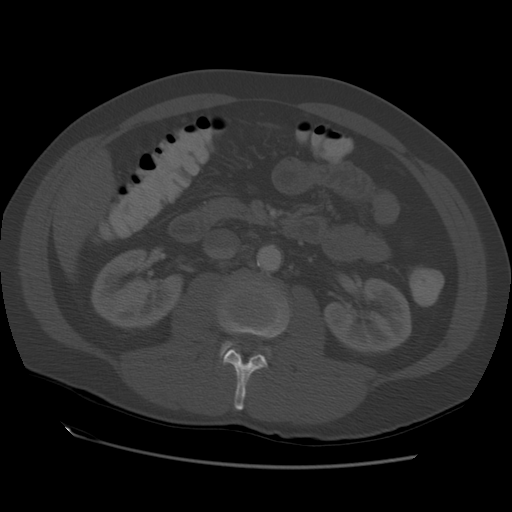
[im 55/82  soft-tissue]
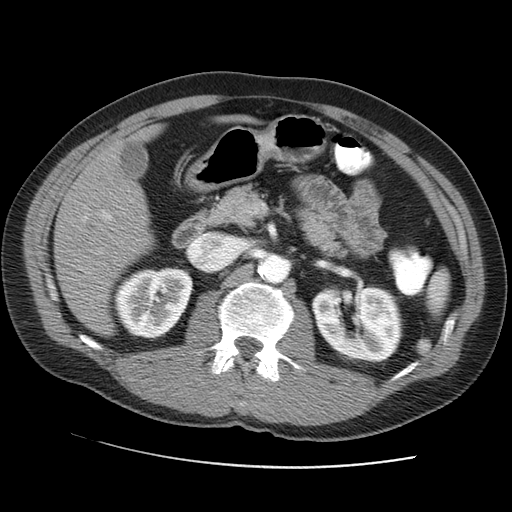
[im 59/82  soft-tissue]
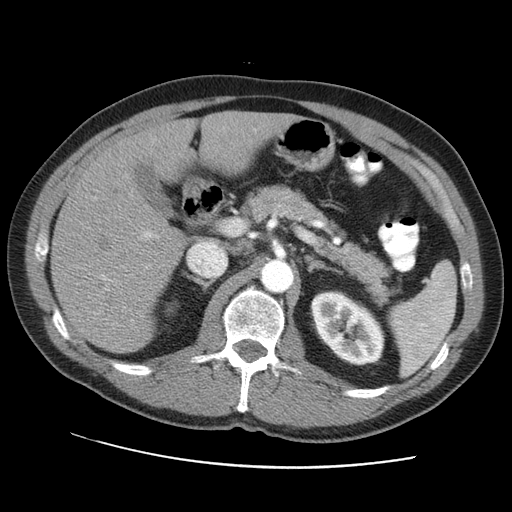
[im 64/82  soft-tissue]
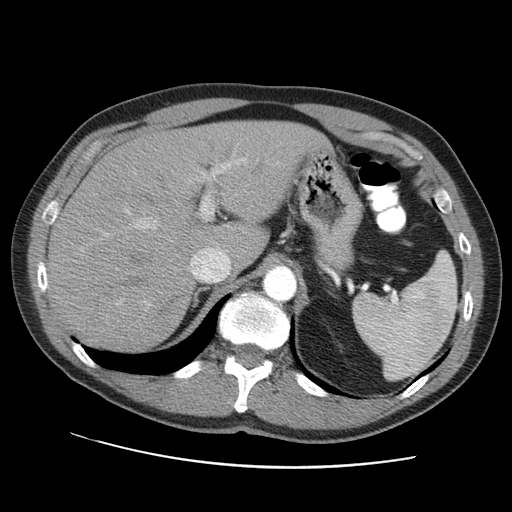
[im 73/82  soft-tissue]
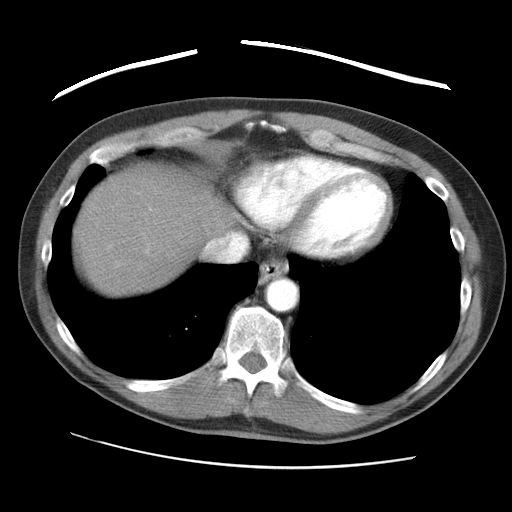
[im 77/82  soft-tissue]
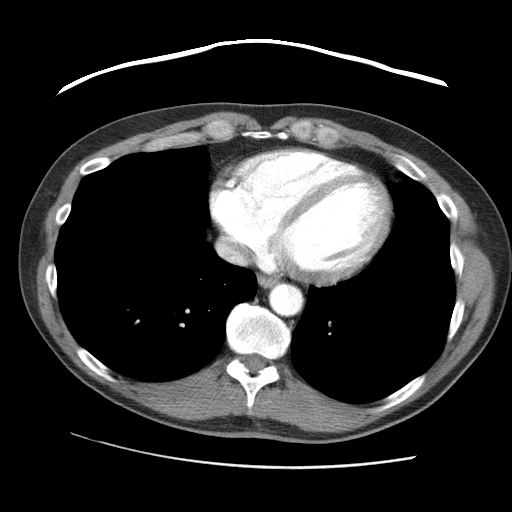

[Series 400: cor · coronal · 0.92mm/px · 3 of 113 slices shown]
[im 38/113  soft-tissue]
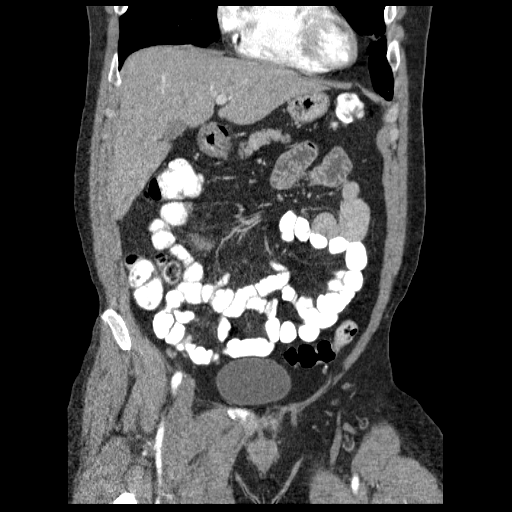
[im 50/113  soft-tissue]
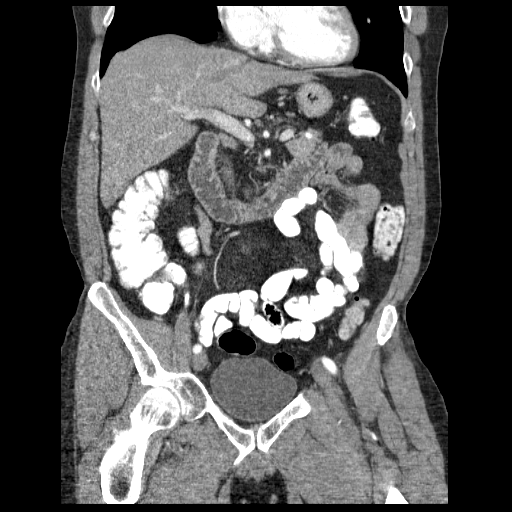
[im 63/113  soft-tissue]
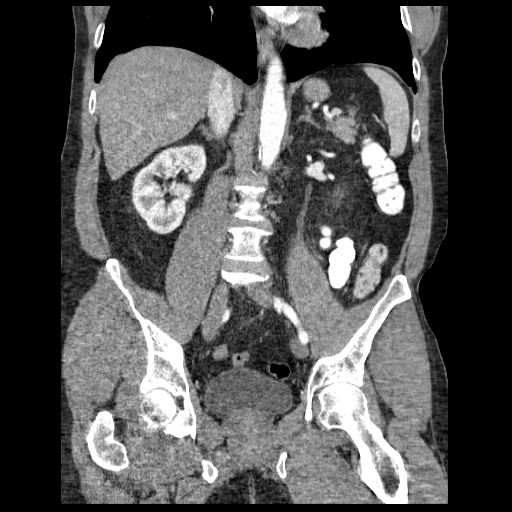

[17 of 46 positions shown; findings below may reference images not displayed]

FINDINGS: The appendix is well visualized and is normal.  The
appendix is filled with contrast and is not thickened or dilated.
There is no stranding in the fat.  No free fluid is present.

Mild diverticulosis in the left colon.  No evidence of
diverticulitis or bowel obstruction.

The liver spleen pancreas and kidneys are normal.  There are no
renal calculi there is no renal obstruction.  No mass or adenopathy
is present.  The lung bases are clear.
IMPRESSION: Normal appendix.

No acute abnormality.

Mild diverticulosis in the left colon.

## 2012-03-17 ENCOUNTER — Other Ambulatory Visit: Payer: Self-pay | Admitting: Gastroenterology

## 2012-03-17 DIAGNOSIS — B182 Chronic viral hepatitis C: Secondary | ICD-10-CM

## 2012-04-03 ENCOUNTER — Other Ambulatory Visit: Payer: BC Managed Care – PPO

## 2012-04-09 ENCOUNTER — Emergency Department (HOSPITAL_BASED_OUTPATIENT_CLINIC_OR_DEPARTMENT_OTHER)
Admission: EM | Admit: 2012-04-09 | Discharge: 2012-04-09 | Disposition: A | Discharge status: Home / Self Care | Payer: BC Managed Care – PPO | Attending: Emergency Medicine | Admitting: Emergency Medicine

## 2012-04-09 ENCOUNTER — Encounter (HOSPITAL_BASED_OUTPATIENT_CLINIC_OR_DEPARTMENT_OTHER): Payer: Self-pay | Admitting: *Deleted

## 2012-04-09 ENCOUNTER — Emergency Department (HOSPITAL_BASED_OUTPATIENT_CLINIC_OR_DEPARTMENT_OTHER): Payer: BC Managed Care – PPO

## 2012-04-09 DIAGNOSIS — R109 Unspecified abdominal pain: Secondary | ICD-10-CM

## 2012-04-09 DIAGNOSIS — F17200 Nicotine dependence, unspecified, uncomplicated: Secondary | ICD-10-CM | POA: Insufficient documentation

## 2012-04-09 DIAGNOSIS — IMO0001 Reserved for inherently not codable concepts without codable children: Secondary | ICD-10-CM | POA: Insufficient documentation

## 2012-04-09 DIAGNOSIS — F411 Generalized anxiety disorder: Secondary | ICD-10-CM | POA: Insufficient documentation

## 2012-04-09 DIAGNOSIS — R110 Nausea: Secondary | ICD-10-CM | POA: Insufficient documentation

## 2012-04-09 LAB — CBC WITH DIFFERENTIAL/PLATELET
Basophils Absolute: 0.1 10*3/uL (ref 0.0–0.1)
Basophils Relative: 1 % (ref 0–1)
Eosinophils Absolute: 0.4 10*3/uL (ref 0.0–0.7)
Eosinophils Relative: 5 % (ref 0–5)
HCT: 41.5 % (ref 39.0–52.0)
Hemoglobin: 14 g/dL (ref 13.0–17.0)
Lymphocytes Relative: 37 % (ref 12–46)
Lymphs Abs: 3.1 10*3/uL (ref 0.7–4.0)
MCH: 31 pg (ref 26.0–34.0)
MCHC: 33.7 g/dL (ref 30.0–36.0)
MCV: 91.8 fL (ref 78.0–100.0)
Monocytes Absolute: 0.7 10*3/uL (ref 0.1–1.0)
Monocytes Relative: 9 % (ref 3–12)
Neutro Abs: 4.1 10*3/uL (ref 1.7–7.7)
Neutrophils Relative %: 49 % (ref 43–77)
Platelets: 169 10*3/uL (ref 150–400)
RBC: 4.52 MIL/uL (ref 4.22–5.81)
RDW: 12.9 % (ref 11.5–15.5)
WBC: 8.4 10*3/uL (ref 4.0–10.5)

## 2012-04-09 LAB — BASIC METABOLIC PANEL
BUN: 14 mg/dL (ref 6–23)
CO2: 27 mEq/L (ref 19–32)
Calcium: 9.7 mg/dL (ref 8.4–10.5)
Chloride: 104 mEq/L (ref 96–112)
Creatinine, Ser: 1.2 mg/dL (ref 0.50–1.35)
GFR calc Af Amer: 76 mL/min — ABNORMAL LOW (ref >90)
GFR calc non Af Amer: 65 mL/min — ABNORMAL LOW (ref >90)
Glucose, Bld: 92 mg/dL (ref 70–99)
Potassium: 4.2 mEq/L (ref 3.5–5.1)
Sodium: 141 mEq/L (ref 135–145)

## 2012-04-09 LAB — URINALYSIS, ROUTINE W REFLEX MICROSCOPIC
Bilirubin Urine: NEGATIVE
Glucose, UA: NEGATIVE mg/dL
Hgb urine dipstick: NEGATIVE
Ketones, ur: NEGATIVE mg/dL
Nitrite: NEGATIVE
Protein, ur: NEGATIVE mg/dL
Specific Gravity, Urine: 1.021 (ref 1.005–1.030)
Urobilinogen, UA: 0.2 mg/dL (ref 0.0–1.0)
pH: 6.5 (ref 5.0–8.0)

## 2012-04-09 LAB — URINE MICROSCOPIC-ADD ON

## 2012-04-09 MED ORDER — HYDROMORPHONE HCL PF 1 MG/ML IJ SOLN
1.0000 mg | Freq: Once | INTRAMUSCULAR | Status: AC
  Administered 2012-04-09: 1 mg via INTRAVENOUS
  Filled 2012-04-09: qty 1

## 2012-04-09 MED ORDER — OXYCODONE-ACETAMINOPHEN 5-325 MG PO TABS: 2.0000 | ORAL_TABLET | ORAL | Status: AC | PRN

## 2012-04-09 MED ORDER — OXYCODONE-ACETAMINOPHEN 5-325 MG PO TABS
2.0000 | ORAL_TABLET | Freq: Once | ORAL | Status: AC
  Administered 2012-04-09: 2 via ORAL
  Filled 2012-04-09 (×2): qty 2

## 2012-04-09 NOTE — ED Notes (Signed)
Flank pain Right side

## 2012-04-09 NOTE — ED Provider Notes (Signed)
History  This chart was scribed for Rolan Bucco, MD by Shari Heritage. The patient was seen in room MH07/MH07. Patient's care was started at 1810.     CSN: [id number]  Arrival date & time 04/09/12  1810   First MD Initiated Contact with Patient 04/09/12 1823      Chief Complaint  Patient presents with  . Flank Pain    The history is provided by the patient. No language interpreter was used.   [name] is a 56 y.o. male who presents to the Emergency Department complaining of intermittent, sharp right flank pain with associated nausea onset 1 day ago. Patient says that pain sometimes radiates to his back. Patient says that pain is mild in severity right now. He has had 2 prior episodes of acute right flank pain in the past several months followed by two CT scans that were both negative. No chest pain or SOB. No fever, cough or cold symptoms at this time. Patient says that he has had some recent sinus congestion and used Nasonex for relief. Patient has a medical history of fibromyalgia. He takes Lyrica for relief. His surgical history includes shoulder surgery. He denies any hematuria or burning on urination.  PCPEthelene Hal  Past Medical History  Diagnosis Date  . Fibromyalgia   . Neck pain   . Anxiety     Past Surgical History  Procedure Date  . Shoulder surgery     No family history on file.  History  Substance Use Topics  . Smoking status: Current Everyday Smoker -- 1.0 packs/day    Types: Cigarettes  . Smokeless tobacco: Not on file  . Alcohol Use: Yes     occasional      Review of Systems  Constitutional: Negative for fever, chills, diaphoresis and fatigue.  HENT: Negative for congestion, rhinorrhea and sneezing.   Eyes: Negative.   Respiratory: Negative for cough, chest tightness and shortness of breath.   Cardiovascular: Negative for chest pain and leg swelling.  Gastrointestinal: Positive for nausea. Negative for vomiting, abdominal pain, diarrhea and blood  in stool.  Genitourinary: Positive for flank pain. Negative for frequency, hematuria and difficulty urinating.  Musculoskeletal: Negative for back pain and arthralgias.  Skin: Negative for rash.  Neurological: Negative for dizziness, speech difficulty, weakness, numbness and headaches.  All other systems reviewed and are negative.    Allergies  Tetanus toxoids  Home Medications   Current Outpatient Rx  Name Route Sig Dispense Refill  . CHLORDIAZEPOXIDE HCL 10 MG PO CAPS Oral Take 10 mg by mouth 4 (four) times daily as needed. For anxiety    . CYCLOBENZAPRINE HCL 10 MG PO TABS Oral Take 10 mg by mouth 3 (three) times daily as needed. For fibromyalgia    . DULOXETINE HCL 60 MG PO CPEP Oral Take 60 mg by mouth daily.      . MOMETASONE FUROATE 50 MCG/ACT NA SUSP Nasal Place 2 sprays into the nose daily.    Marland Kitchen PANTOPRAZOLE SODIUM 40 MG PO TBEC Oral Take 40 mg by mouth daily as needed. For acid reflux    . PREGABALIN 75 MG PO CAPS Oral Take 75 mg by mouth every other day. For fibromyalgia    . OXYCODONE-ACETAMINOPHEN 5-325 MG PO TABS Oral Take 2 tablets by mouth every 4 (four) hours as needed for pain. 15 tablet 0    BP 135/82  Pulse 86  Temp 98.7 F (37.1 C) (Oral)  Resp 18  SpO2 100%  Physical Exam  Constitutional: He is oriented to person, place, and time. He appears well-developed and well-nourished.  HENT:  Head: Normocephalic and atraumatic.  Eyes: Pupils are equal, round, and reactive to light.  Neck: Normal range of motion. Neck supple.  Cardiovascular: Normal rate, regular rhythm and normal heart sounds.   Pulmonary/Chest: Effort normal and breath sounds normal. No respiratory distress. He has no wheezes. He has no rales. He exhibits no tenderness.  Abdominal: Soft. Bowel sounds are normal. There is no tenderness. There is no rebound and no guarding.       Mild tenderness to the right flank. No pain in the inguinal area or testicle.  Musculoskeletal: Normal range of  motion. He exhibits no edema.  Lymphadenopathy:    He has no cervical adenopathy.  Neurological: He is alert and oriented to person, place, and time.  Skin: Skin is warm and dry. No rash noted.  Psychiatric: He has a normal mood and affect.    ED Course  Procedures (including critical care time) DIAGNOSTIC STUDIES: Oxygen Saturation is 98% on room air, normal by my interpretation.    COORDINATION OF CARE: 6:29pm- Patient informed of current plan for treatment and evaluation and agrees with plan at this time. Will administer shot of Dilaudid 1 mg. Have ordered a chest X-ray, CBC, basic metabolic panel and urinalysis.  Results for orders placed during the hospital encounter of 04/09/12  URINALYSIS, ROUTINE W REFLEX MICROSCOPIC      Component Value Range   Color, Urine YELLOW  YELLOW   APPearance CLEAR  CLEAR   Specific Gravity, Urine 1.021  1.005 - 1.030   pH 6.5  5.0 - 8.0   Glucose, UA NEGATIVE  NEGATIVE mg/dL   Hgb urine dipstick NEGATIVE  NEGATIVE   Bilirubin Urine NEGATIVE  NEGATIVE   Ketones, ur NEGATIVE  NEGATIVE mg/dL   Protein, ur NEGATIVE  NEGATIVE mg/dL   Urobilinogen, UA 0.2  0.0 - 1.0 mg/dL   Nitrite NEGATIVE  NEGATIVE   Leukocytes, UA MODERATE (*) NEGATIVE  CBC WITH DIFFERENTIAL      Component Value Range   WBC 8.4  4.0 - 10.5 K/uL   RBC 4.52  4.22 - 5.81 MIL/uL   Hemoglobin 14.0  13.0 - 17.0 g/dL   HCT 16.1  09.6 - 04.5 %   MCV 91.8  78.0 - 100.0 fL   MCH 31.0  26.0 - 34.0 pg   MCHC 33.7  30.0 - 36.0 g/dL   RDW 40.9  81.1 - 91.4 %   Platelets 169  150 - 400 K/uL   Neutrophils Relative 49  43 - 77 %   Neutro Abs 4.1  1.7 - 7.7 K/uL   Lymphocytes Relative 37  12 - 46 %   Lymphs Abs 3.1  0.7 - 4.0 K/uL   Monocytes Relative 9  3 - 12 %   Monocytes Absolute 0.7  0.1 - 1.0 K/uL   Eosinophils Relative 5  0 - 5 %   Eosinophils Absolute 0.4  0.0 - 0.7 K/uL   Basophils Relative 1  0 - 1 %   Basophils Absolute 0.1  0.0 - 0.1 K/uL  BASIC METABOLIC PANEL       Component Value Range   Sodium 141  135 - 145 mEq/L   Potassium 4.2  3.5 - 5.1 mEq/L   Chloride 104  96 - 112 mEq/L   CO2 27  19 - 32 mEq/L   Glucose, Bld 92  70 - 99 mg/dL  BUN 14  6 - 23 mg/dL   Creatinine, Ser 1.61  0.50 - 1.35 mg/dL   Calcium 9.7  8.4 - 09.6 mg/dL   GFR calc non Af Amer 65 (*) >90 mL/min   GFR calc Af Amer 76 (*) >90 mL/min  URINE MICROSCOPIC-ADD ON      Component Value Range   WBC, UA 3-6  <3 WBC/hpf   Bacteria, UA FEW (*) RARE   Dg Abd Acute W/chest  04/09/2012  *RADIOLOGY REPORT*  Clinical Data:  Right flank pain, nausea  ACUTE ABDOMEN SERIES (ABDOMEN 2 VIEW & CHEST 1 VIEW)  Comparison: CT 08/07/2011  Findings: Frontal chest radiograph is clear.  No free air.  Small bowel decompressed.  Moderate fecal material in the proximal colon, decompressed distally.  Bilateral pelvic vascular calcifications. Regional bones unremarkable.  IMPRESSION:  1.  Nonobstructive bowel gas pattern with moderate proximal colonic fecal material. 2.  No free air. 3.  No acute cardiopulmonary disease.  Original Report Authenticated By: Thora Lance III, M.D.     1. Abdominal pain       MDM  Pt with right flank pain.  Intermittent.  No testicle pain.  No hematuria to suggest renal colic and pt has had 2 similar episodes since December with 2 negative Ct scans.  Has been seeing GI as well with no definite diagnosis.  Doubt appendicitis, pain is sharp and intermittent.  Do not feel that CT needs to be repeated at this time.  Will advise f/u with GI, rx for small amount of pain meds.  Advised stool softener.  Discussed returning if symptoms persist or worsen to reconsider CT scan.      I personally performed the services described in this documentation, which was scribed in my presence.  The recorded information has been reviewed and considered.    Rolan Bucco, MD 04/09/12 [phone]

## 2012-06-22 IMAGING — CT CT ABD-PELV W/ CM
2 of 5 series · 14 of 32 positions shown, 19 images · IV contrast (agent unspecified)
Comparison: CT abdomen pelvis of 04/22/2010

CLINICAL DATA: Blood in stool, history hepatitis C, irritable bowel
syndrome, and gastroesophageal reflux

CT ABDOMEN AND PELVIS WITH CONTRAST
TECHNIQUE: Multidetector CT imaging of the abdomen and pelvis was
performed following the standard protocol during bolus
administration of intravenous contrast.
Contrast: 80 ml [id number]

[Series 2: routine abdomen · axial · 0.70mm/px · z∈[-453,-133]mm · 6 of 90 slices shown, 11 images]
[im 13/90  soft-tissue]
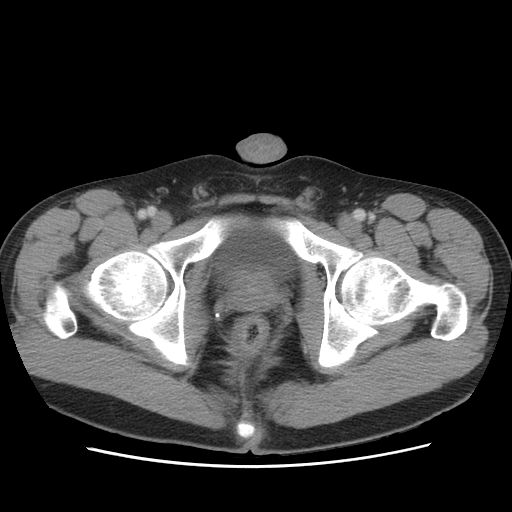
[im 13/90  bone]
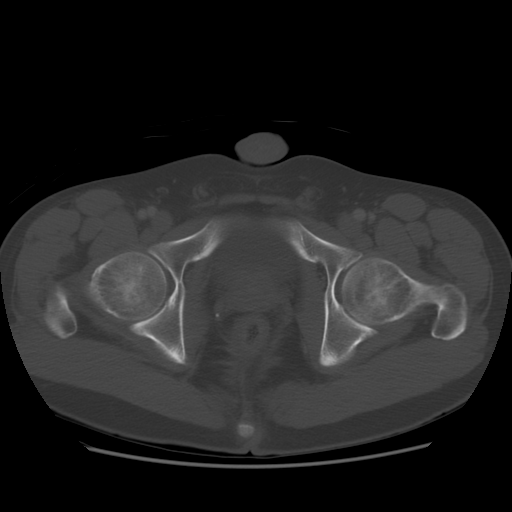
[im 26/90  soft-tissue]
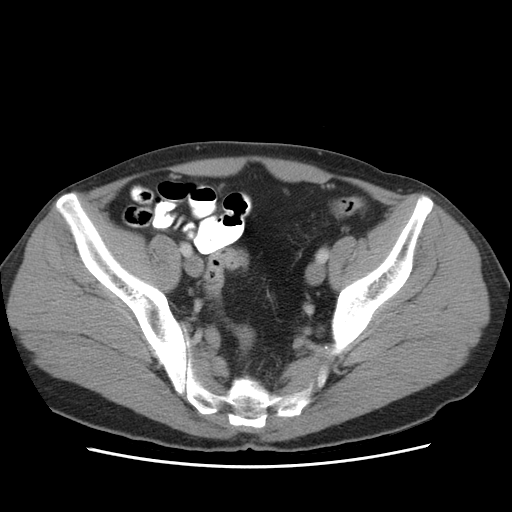
[im 39/90  soft-tissue]
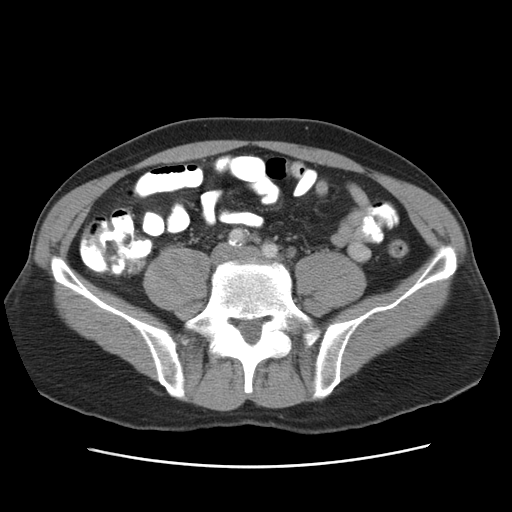
[im 39/90  lung]
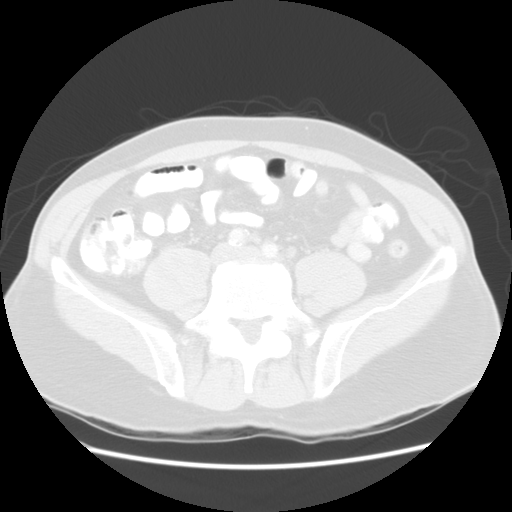
[im 51/90  soft-tissue]
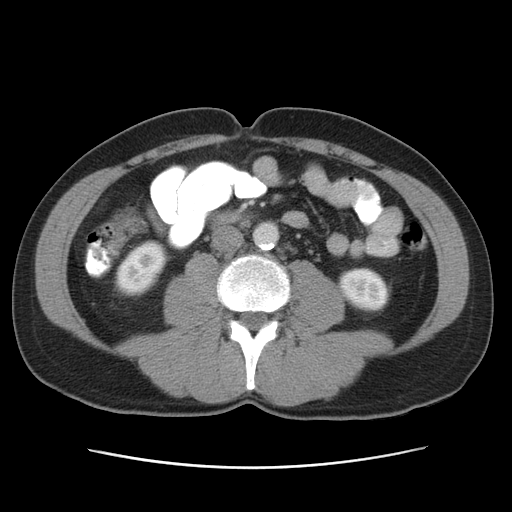
[im 51/90  lung]
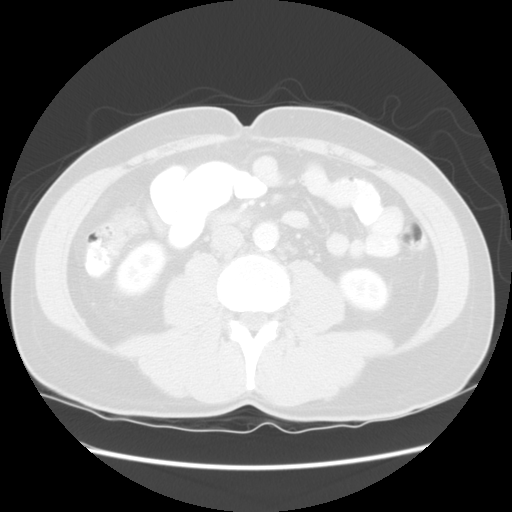
[im 64/90  soft-tissue]
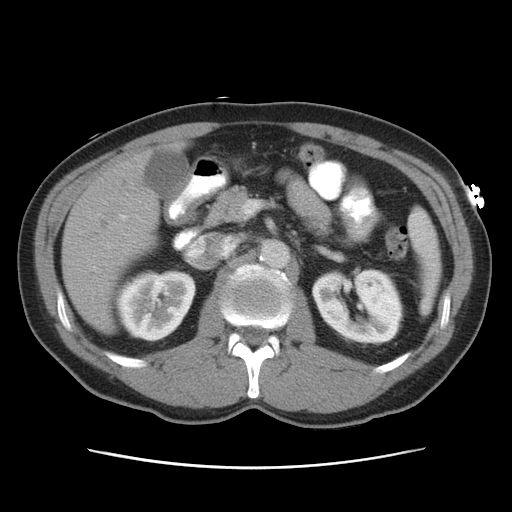
[im 64/90  lung]
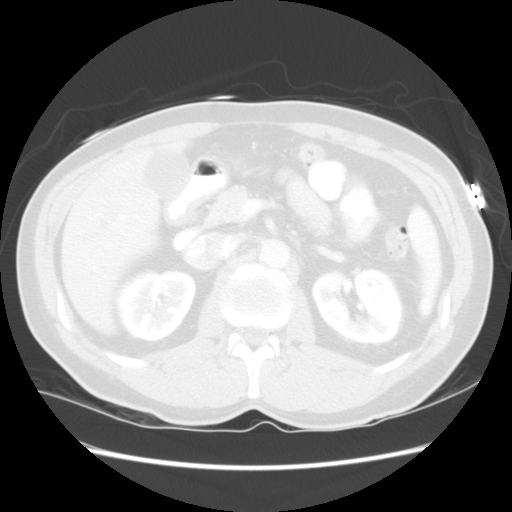
[im 77/90  soft-tissue]
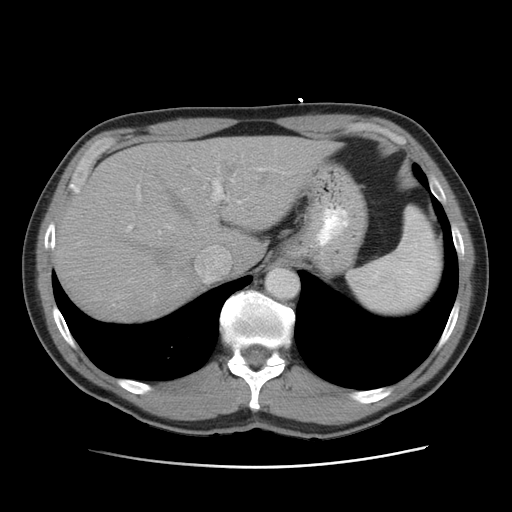
[im 77/90  lung]
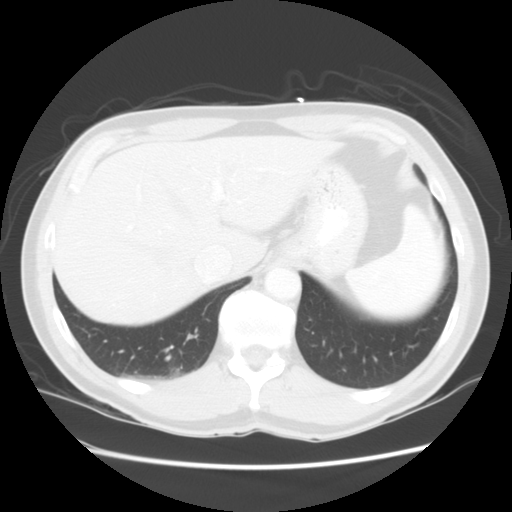

[Series 400: sagittal a/p · sagittal · 0.92mm/px · 8 of 99 slices shown]
[im 11/99  soft-tissue]
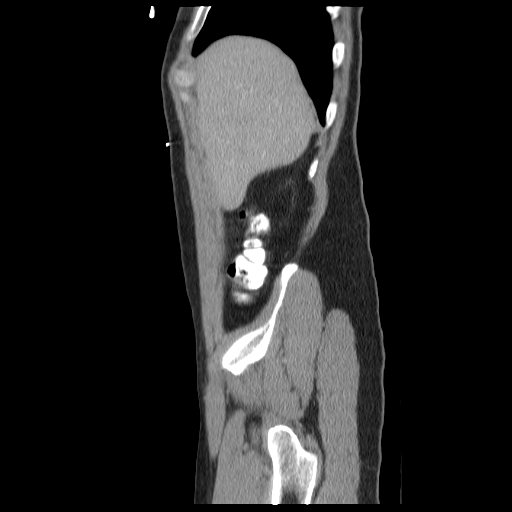
[im 22/99  soft-tissue]
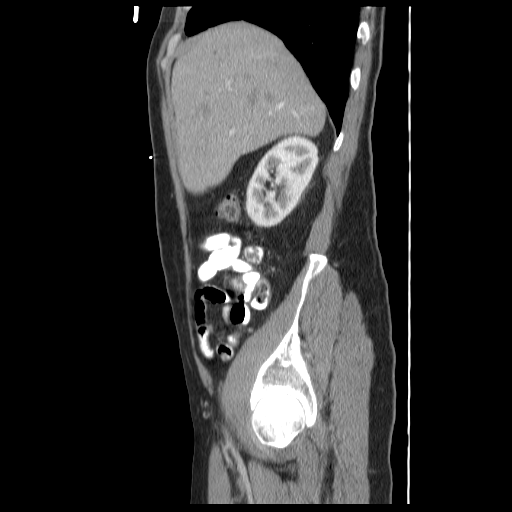
[im 33/99  soft-tissue]
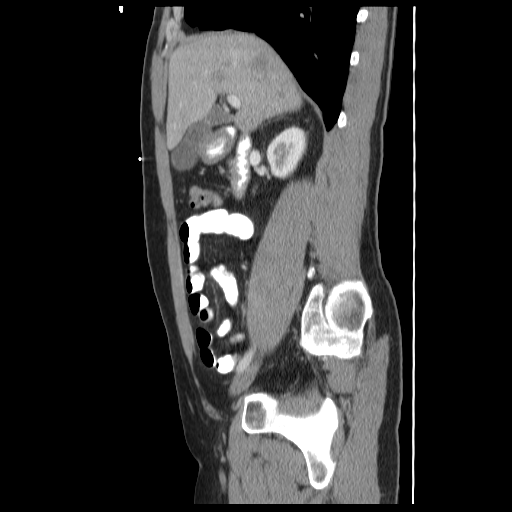
[im 44/99  soft-tissue]
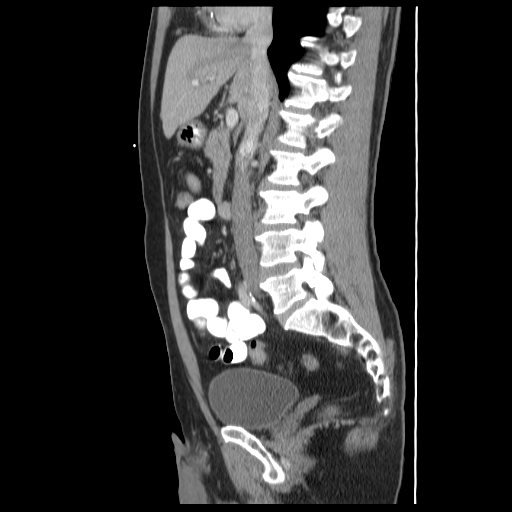
[im 55/99  soft-tissue]
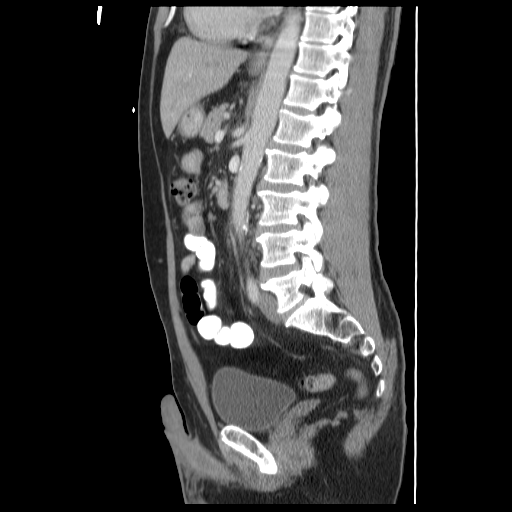
[im 66/99  soft-tissue]
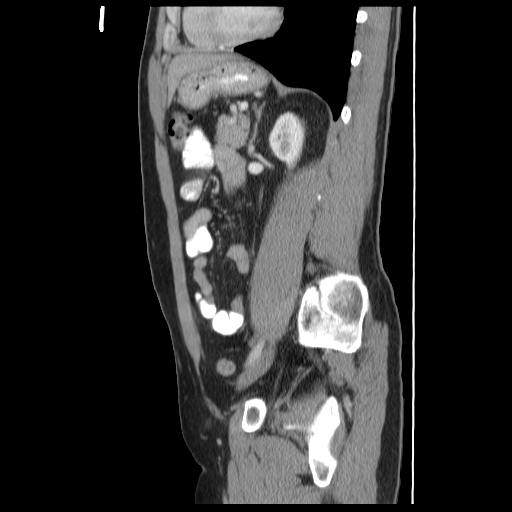
[im 77/99  soft-tissue]
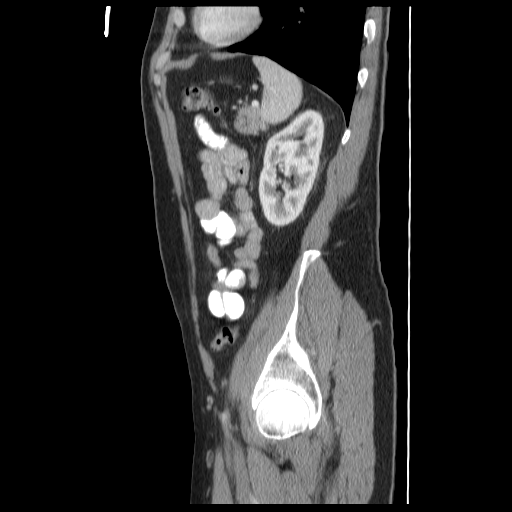
[im 88/99  soft-tissue]
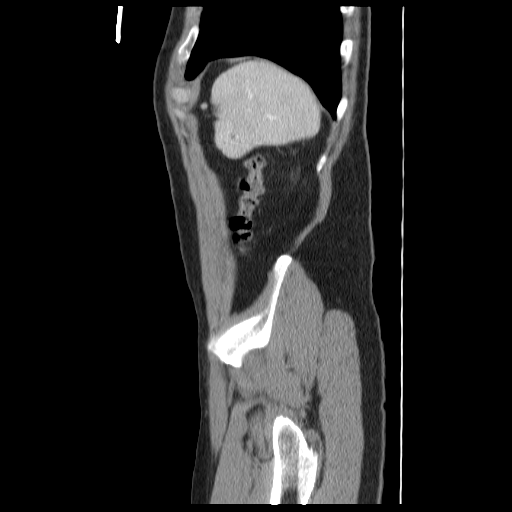

[14 of 32 positions shown; findings below may reference images not displayed]

FINDINGS: The lung bases are clear.  The liver enhances with no
focal abnormality and no ductal dilatation is seen.  No calcified
gallstones are noted.  The pancreas is normal in size and the
pancreatic duct is not dilated.  The adrenal glands and spleen are
unremarkable.  The stomach is decompressed.  The kidneys enhance
with no calculus or mass and no hydronephrosis is seen.  The
abdominal aorta is normal in caliber with mild atheromatous change
present.  There are a few retroperitoneal nodes present which are
not pathologically enlarged and which appear stable.

The appendix is visualized in the right lower quadrant and appears
normal.  The urinary bladder is unremarkable.  The prostate is
normal in size.  The terminal ileum is unremarkable.  The only
questionable abnormality of the colon is slight prominence of the
mucosa of the rectum and proctitis cannot be excluded.  No fluid is
seen within the pelvis.  No bony abnormality is seen.
IMPRESSION: 1.  No definite abnormality on CT of the abdomen and pelvis.  The
only questionable abnormality is slight prominence of the mucosa of
the rectum and proctitis cannot be excluded.
2.  No renal calculi.  No hydronephrosis.

## 2012-07-27 ENCOUNTER — Encounter (HOSPITAL_BASED_OUTPATIENT_CLINIC_OR_DEPARTMENT_OTHER): Payer: Self-pay | Admitting: Emergency Medicine

## 2012-07-27 ENCOUNTER — Emergency Department (HOSPITAL_BASED_OUTPATIENT_CLINIC_OR_DEPARTMENT_OTHER): Payer: BC Managed Care – PPO

## 2012-07-27 ENCOUNTER — Emergency Department (HOSPITAL_BASED_OUTPATIENT_CLINIC_OR_DEPARTMENT_OTHER)
Admission: EM | Admit: 2012-07-27 | Discharge: 2012-07-27 | Disposition: A | Discharge status: Home / Self Care | Payer: BC Managed Care – PPO | Attending: Emergency Medicine | Admitting: Emergency Medicine

## 2012-07-27 DIAGNOSIS — R1013 Epigastric pain: Secondary | ICD-10-CM | POA: Insufficient documentation

## 2012-07-27 DIAGNOSIS — Z79899 Other long term (current) drug therapy: Secondary | ICD-10-CM | POA: Insufficient documentation

## 2012-07-27 DIAGNOSIS — F411 Generalized anxiety disorder: Secondary | ICD-10-CM | POA: Insufficient documentation

## 2012-07-27 DIAGNOSIS — K219 Gastro-esophageal reflux disease without esophagitis: Secondary | ICD-10-CM | POA: Insufficient documentation

## 2012-07-27 DIAGNOSIS — IMO0001 Reserved for inherently not codable concepts without codable children: Secondary | ICD-10-CM | POA: Insufficient documentation

## 2012-07-27 DIAGNOSIS — K2990 Gastroduodenitis, unspecified, without bleeding: Secondary | ICD-10-CM | POA: Insufficient documentation

## 2012-07-27 DIAGNOSIS — R079 Chest pain, unspecified: Secondary | ICD-10-CM

## 2012-07-27 DIAGNOSIS — K2970 Gastritis, unspecified, without bleeding: Secondary | ICD-10-CM | POA: Insufficient documentation

## 2012-07-27 DIAGNOSIS — M542 Cervicalgia: Secondary | ICD-10-CM | POA: Insufficient documentation

## 2012-07-27 DIAGNOSIS — F17200 Nicotine dependence, unspecified, uncomplicated: Secondary | ICD-10-CM | POA: Insufficient documentation

## 2012-07-27 HISTORY — DX: Gastro-esophageal reflux disease without esophagitis: K21.9

## 2012-07-27 LAB — COMPREHENSIVE METABOLIC PANEL
ALT: 77 U/L — ABNORMAL HIGH (ref 0–53)
AST: 32 U/L (ref 0–37)
Albumin: 4 g/dL (ref 3.5–5.2)
Alkaline Phosphatase: 65 U/L (ref 39–117)
BUN: 19 mg/dL (ref 6–23)
CO2: 28 mEq/L (ref 19–32)
Calcium: 9.8 mg/dL (ref 8.4–10.5)
Chloride: 104 mEq/L (ref 96–112)
Creatinine, Ser: 1.1 mg/dL (ref 0.50–1.35)
GFR calc Af Amer: 84 mL/min — ABNORMAL LOW (ref >90)
GFR calc non Af Amer: 73 mL/min — ABNORMAL LOW (ref >90)
Glucose, Bld: 93 mg/dL (ref 70–99)
Potassium: 4 mEq/L (ref 3.5–5.1)
Sodium: 142 mEq/L (ref 135–145)
Total Bilirubin: 0.3 mg/dL (ref 0.3–1.2)
Total Protein: 7.4 g/dL (ref 6.0–8.3)

## 2012-07-27 LAB — CBC
HCT: 42.3 % (ref 39.0–52.0)
Hemoglobin: 14.7 g/dL (ref 13.0–17.0)
MCH: 31.6 pg (ref 26.0–34.0)
MCHC: 34.8 g/dL (ref 30.0–36.0)
MCV: 91 fL (ref 78.0–100.0)
Platelets: 166 10*3/uL (ref 150–400)
RBC: 4.65 MIL/uL (ref 4.22–5.81)
RDW: 13.6 % (ref 11.5–15.5)
WBC: 7.2 10*3/uL (ref 4.0–10.5)

## 2012-07-27 LAB — TROPONIN I
Troponin I: <0.3 ng/mL (ref <0.30)
Troponin I: <0.3 ng/mL (ref <0.30)

## 2012-07-27 LAB — LIPASE, BLOOD: Lipase: 28 U/L (ref 11–59)

## 2012-07-27 MED ORDER — MORPHINE SULFATE 4 MG/ML IJ SOLN
INTRAMUSCULAR | Status: AC
  Administered 2012-07-27: 4 mg via INTRAVENOUS
  Filled 2012-07-27: qty 1

## 2012-07-27 MED ORDER — GI COCKTAIL ~~LOC~~
30.0000 mL | Freq: Once | ORAL | Status: AC
  Administered 2012-07-27: 30 mL via ORAL
  Filled 2012-07-27: qty 30

## 2012-07-27 MED ORDER — SUCRALFATE 1 GM/10ML PO SUSP: 1.0000 g | Freq: Four times a day (QID) | ORAL | Status: DC

## 2012-07-27 MED ORDER — NITROGLYCERIN 0.4 MG SL SUBL
0.4000 mg | SUBLINGUAL_TABLET | SUBLINGUAL | Status: DC | PRN
  Administered 2012-07-27: 0.4 mg via SUBLINGUAL
  Filled 2012-07-27: qty 25

## 2012-07-27 MED ORDER — OXYCODONE-ACETAMINOPHEN 5-325 MG PO TABS: 1.0000 | ORAL_TABLET | Freq: Four times a day (QID) | ORAL | Status: DC | PRN

## 2012-07-27 MED ORDER — PREDNISONE 20 MG PO TABS: 40.0000 mg | ORAL_TABLET | Freq: Every day | ORAL | Status: DC

## 2012-07-27 MED ORDER — HYDROMORPHONE HCL PF 1 MG/ML IJ SOLN
1.0000 mg | Freq: Once | INTRAMUSCULAR | Status: AC
  Administered 2012-07-27: 1 mg via INTRAVENOUS
  Filled 2012-07-27: qty 1

## 2012-07-27 MED ORDER — ONDANSETRON HCL 4 MG/2ML IJ SOLN
4.0000 mg | Freq: Once | INTRAMUSCULAR | Status: AC
  Administered 2012-07-27: 4 mg via INTRAVENOUS
  Filled 2012-07-27: qty 2

## 2012-07-27 MED ORDER — MORPHINE SULFATE 4 MG/ML IJ SOLN
4.0000 mg | Freq: Once | INTRAMUSCULAR | Status: AC
  Administered 2012-07-27: 4 mg via INTRAVENOUS
  Filled 2012-07-27: qty 1

## 2012-07-27 MED ORDER — PANTOPRAZOLE SODIUM 40 MG IV SOLR
40.0000 mg | Freq: Once | INTRAVENOUS | Status: AC
  Administered 2012-07-27: 40 mg via INTRAVENOUS
  Filled 2012-07-27: qty 40

## 2012-07-27 MED ORDER — OMEPRAZOLE 20 MG PO CPDR: 20.0000 mg | DELAYED_RELEASE_CAPSULE | Freq: Two times a day (BID) | ORAL | Status: DC

## 2012-07-27 MED ORDER — MORPHINE SULFATE 4 MG/ML IJ SOLN
4.0000 mg | Freq: Once | INTRAMUSCULAR | Status: AC
  Administered 2012-07-27: 4 mg via INTRAVENOUS

## 2012-07-27 NOTE — ED Notes (Signed)
Family to desk stating pt needs more pain medication. MD made aware

## 2012-07-27 NOTE — ED Provider Notes (Signed)
History     CSN: [id number]  Arrival date & time 07/27/12  0255   First MD Initiated Contact with Patient 07/27/12 [phone]      Chief Complaint  Patient presents with  . Chest Pain    (Consider location/radiation/quality/duration/timing/severity/associated sxs/prior treatment) HPI Comments: [name] presents ambulatory for chest pain.  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  He states he has had mild burning in the center of his chest for about a week.  It increased in intensity tonight after laying down to go to bed.  He states the pain had approached being severe and has been associated with nausea, burping, and water brash.  He denies any fever, shortness of breath, or palpitations.  He denies vomiting, hematemesis, hematochezia, melena, or dysuria.  Laying down seems to exacerbate the discomfort.  He denies any other symptoms or discomfort.   The history is provided by the patient. No language interpreter was used.    Past Medical History  Diagnosis Date  . Fibromyalgia   . Neck pain   . Anxiety   . Esophageal reflux     eosinophil esophagitis    Past Surgical History  Procedure Date  . Shoulder surgery     History reviewed. No pertinent family history.  History  Substance Use Topics  . Smoking status: Current Every Day Smoker -- 1.0 packs/day    Types: Cigarettes  . Smokeless tobacco: Not on file  . Alcohol Use: Yes     Comment: occasional      Review of Systems  All other systems reviewed and are negative.    Allergies  Tetanus toxoids  Home Medications   Current Outpatient Rx  Name  Route  Sig  Dispense  Refill  . CHLORDIAZEPOXIDE HCL 10 MG PO CAPS   Oral   Take 10 mg by mouth 4 (four) times daily as needed. For anxiety         . DULOXETINE HCL 60 MG PO CPEP   Oral   Take 60 mg by mouth daily.           Marland Kitchen PANTOPRAZOLE SODIUM 40 MG PO TBEC   Oral   Take 40 mg by  mouth daily as needed. For acid reflux         . CYCLOBENZAPRINE HCL 10 MG PO TABS   Oral   Take 10 mg by mouth 3 (three) times daily as needed. For fibromyalgia         . MOMETASONE FUROATE 50 MCG/ACT NA SUSP   Nasal   Place 2 sprays into the nose daily.         Marland Kitchen PREGABALIN 75 MG PO CAPS   Oral   Take 75 mg by mouth every other day. For fibromyalgia           BP 138/85  Pulse 60  Temp 98 F (36.7 C) (Oral)  Resp 16  Ht 5\' 9"  (1.753 m)  Wt 190 lb (86.183 kg)  BMI 28.06 kg/m2  SpO2 99%  Physical Exam  Nursing note and vitals reviewed. Constitutional: He is oriented to person, place, and time. He appears well-developed and well-nourished. No distress.  HENT:  Head: Normocephalic and atraumatic.  Right Ear: External ear normal.  Left Ear: External ear normal.  Nose: Nose normal.  Mouth/Throat: Oropharynx is clear and moist. No oropharyngeal exudate.  Eyes: Conjunctivae normal are normal. Pupils are equal, round, and reactive to light.  Right eye exhibits no discharge. Left eye exhibits no discharge. No scleral icterus.  Neck: Normal range of motion. Neck supple. No JVD present. No tracheal deviation present.  Cardiovascular: Normal rate, regular rhythm and intact distal pulses.  Exam reveals no gallop and no friction rub.   No murmur heard. Pulmonary/Chest: Effort normal and breath sounds normal. No stridor. No respiratory distress. He has no wheezes. He has no rales. He exhibits no tenderness.  Abdominal: Soft. Normal appearance and bowel sounds are normal. He exhibits no distension, no abdominal bruit, no ascites, no pulsatile midline mass and no mass. There is no hepatosplenomegaly. There is tenderness in the epigastric area. There is no rigidity, no rebound, no guarding, no CVA tenderness, no tenderness at McBurney's point and negative Murphy's sign. No hernia.  Musculoskeletal: Normal range of motion. He exhibits no edema and no tenderness.  Lymphadenopathy:    He  has no cervical adenopathy.  Neurological: He is alert and oriented to person, place, and time. No cranial nerve deficit. He exhibits normal muscle tone.  Skin: Skin is warm and dry. No rash noted. He is not diaphoretic. No erythema. No pallor.  Psychiatric: He has a normal mood and affect. His behavior is normal.    ED Course  Procedures (including critical care time)  Labs Reviewed - No data to display No results found.   No diagnosis found.   Date: 07/27/2012  Rate: 61 bpm  Rhythm: sinus  QRS Axis: normal  Intervals: normal  ST/T Wave abnormalities: normal  Conduction Disutrbances:none  Narrative Interpretation:   Old EKG Reviewed: none available      MDM  Pt presents foe evaluation of upper abdominal discomfort, reflux, and chest pain.  He appears uncomfortable, note stable vital signs, NAD.  He states he has no hx of CAD, but this feels exactly like the symptoms he was having 2-3 years ago when he was diagnosed with eosinophilic esophagitis.  This diagnosis was made after a colonoscopy and upper endoscopy was performed.  It was treated with a course of steroids and subsequently improved.  EKG performed on arrival demonstrates no evidence of acute ischemia.  Will obtain a CXR, CBC, CMP, trop x1, and lipase.  Will teat him with protonix, zofran, and a GI cocktail.  Will reassess.  [id number].  Pt states he is positive that his discomfort is not secondary to his heart or angina.  He states the discomfort has improved.  Will treat pt with a PPI, steroid pulse, and pain medication.  Encouraged outpt follow-up with his PMD and GI specialist.  Will repeat a troponin prior to him leaving the ER.      Tobin Chad, MD 07/27/12 [phone]

## 2012-07-27 NOTE — ED Notes (Signed)
Pt reports decreased pain in chest,

## 2012-07-27 NOTE — ED Notes (Signed)
Pt states he had previous chest pain symptoms in past and was ruled out cardiac, but dx with eosinophil esophagitis. Pt reports he started having burning in center of chest > 1 week ago, tonight burning got significantly worse and radiated to left upper shoulder

## 2012-07-28 ENCOUNTER — Encounter (HOSPITAL_BASED_OUTPATIENT_CLINIC_OR_DEPARTMENT_OTHER): Payer: Self-pay | Admitting: *Deleted

## 2012-07-28 ENCOUNTER — Emergency Department (HOSPITAL_BASED_OUTPATIENT_CLINIC_OR_DEPARTMENT_OTHER)
Admission: EM | Admit: 2012-07-28 | Discharge: 2012-07-28 | Disposition: A | Discharge status: Home / Self Care | Payer: BC Managed Care – PPO | Attending: Emergency Medicine | Admitting: Emergency Medicine

## 2012-07-28 DIAGNOSIS — Z8739 Personal history of other diseases of the musculoskeletal system and connective tissue: Secondary | ICD-10-CM | POA: Insufficient documentation

## 2012-07-28 DIAGNOSIS — Z8619 Personal history of other infectious and parasitic diseases: Secondary | ICD-10-CM | POA: Insufficient documentation

## 2012-07-28 DIAGNOSIS — F17200 Nicotine dependence, unspecified, uncomplicated: Secondary | ICD-10-CM | POA: Insufficient documentation

## 2012-07-28 DIAGNOSIS — IMO0002 Reserved for concepts with insufficient information to code with codable children: Secondary | ICD-10-CM | POA: Insufficient documentation

## 2012-07-28 DIAGNOSIS — R1013 Epigastric pain: Secondary | ICD-10-CM | POA: Insufficient documentation

## 2012-07-28 DIAGNOSIS — F411 Generalized anxiety disorder: Secondary | ICD-10-CM | POA: Insufficient documentation

## 2012-07-28 DIAGNOSIS — Z79899 Other long term (current) drug therapy: Secondary | ICD-10-CM | POA: Insufficient documentation

## 2012-07-28 MED ORDER — OXYCODONE-ACETAMINOPHEN 5-325 MG PO TABS: 1.0000 | ORAL_TABLET | Freq: Four times a day (QID) | ORAL | Status: DC | PRN

## 2012-07-28 NOTE — ED Notes (Signed)
MD at bedside. 

## 2012-07-28 NOTE — ED Provider Notes (Signed)
History     CSN: [id number]  Arrival date & time 07/28/12  1003   First MD Initiated Contact with Patient 07/28/12 1054      Chief Complaint  Patient presents with  . Gastrophageal Reflux    (Consider location/radiation/quality/duration/timing/severity/associated sxs/prior treatment) HPI Pt presents with c/o continued epigastric burning and pain.  Was discharged just over 24 hours ago for esophagitis.  Pt was given prednisone, ppi and percocet.  He states he is having continued pain, and has not been able to get an appointment with his PMD or GI physician due to them being closed for the holidays.  He is presenting with request for more percocet as he is concerned he will continue to have pain over the weekend.  No change in nature of pain or symptoms.  States he is taking PPI and prednisone- first dose yesterday.  There are no other associated systemic symptoms, there are no other alleviating or modifying factors.   Past Medical History  Diagnosis Date  . Fibromyalgia   . Neck pain   . Anxiety   . Esophageal reflux     eosinophil esophagitis  . Acute hepatitis C without mention of hepatic coma     Past Surgical History  Procedure Date  . Shoulder surgery     No family history on file.  History  Substance Use Topics  . Smoking status: Current Every Day Smoker -- 1.0 packs/day    Types: Cigarettes  . Smokeless tobacco: Not on file  . Alcohol Use: Yes     Comment: occasional      Review of Systems ROS reviewed and all otherwise negative except for mentioned in HPI  Allergies  Tetanus toxoids  Home Medications   Current Outpatient Rx  Name  Route  Sig  Dispense  Refill  . CHLORDIAZEPOXIDE HCL 10 MG PO CAPS   Oral   Take 10 mg by mouth 4 (four) times daily as needed. For anxiety         . CYCLOBENZAPRINE HCL 10 MG PO TABS   Oral   Take 10 mg by mouth 3 (three) times daily as needed. For fibromyalgia         . DULOXETINE HCL 60 MG PO CPEP   Oral  Take 60 mg by mouth daily.           . MOMETASONE FUROATE 50 MCG/ACT NA SUSP   Nasal   Place 2 sprays into the nose daily.         Marland Kitchen OMEPRAZOLE 20 MG PO CPDR   Oral   Take 1 capsule (20 mg total) by mouth 2 (two) times daily.   30 capsule   0   . OXYCODONE-ACETAMINOPHEN 5-325 MG PO TABS   Oral   Take 1 tablet by mouth every 6 (six) hours as needed for pain.   12 tablet   0   . OXYCODONE-ACETAMINOPHEN 5-325 MG PO TABS   Oral   Take 1-2 tablets by mouth every 6 (six) hours as needed for pain.   12 tablet   0   . PANTOPRAZOLE SODIUM 40 MG PO TBEC   Oral   Take 40 mg by mouth daily as needed. For acid reflux         . PREDNISONE 20 MG PO TABS   Oral   Take 2 tablets (40 mg total) by mouth daily. Take 40 mg by mouth daily for 3 days, then 20mg  by mouth daily for 3 days, then 10mg  daily  for 3 days   12 tablet   0   . PREGABALIN 75 MG PO CAPS   Oral   Take 75 mg by mouth every other day. For fibromyalgia         . SUCRALFATE 1 GM/10ML PO SUSP   Oral   Take 10 mLs (1 g total) by mouth 4 (four) times daily.   420 mL   0     BP 138/82  Pulse 77  Temp 98.7 F (37.1 C) (Oral)  SpO2 96% Vitals reviewed Physical Exam Physical Examination: General appearance - alert, well appearing, and in no distress Mental status - alert, oriented to person, place, and time Eyes - no conjunctival injection, no scleral icterus Chest - no increased respiratory effort, speaking in full sentences Abdomen - soft, nontender, nondistended, no masses or organomegaly Extremities - peripheral pulses normal, no pedal edema, no clubbing or cyanosis Skin - normal coloration and turgor, no rashes  ED Course  Procedures (including critical care time)  Labs Reviewed - No data to display Dg Chest 2 View  07/27/2012  *RADIOLOGY REPORT*  Clinical Data: Mid chest pain.  CHEST - 2 VIEW  Comparison: Chest radiograph performed 07/12/2010  Findings: The lungs are well-aerated.  Mild chronic  peribronchial thickening is noted.  There is no evidence of focal opacification, pleural effusion or pneumothorax.  The heart is normal in size; the mediastinal contour is within normal limits.  No acute osseous abnormalities are seen.  IMPRESSION: No acute cardiopulmonary process seen.  Mild chronic peribronchial thickening noted.   Original Report Authenticated By: Tonia Ghent, M.D.      1. Epigastric pain       MDM  Pt presenting with c/o epigastric pain c/w his prior esophagitis.  Pt requesting further percocet as it is the hoiday weekend and his doctor's offices are closed.  D/w him it is important to continue the PPI and steroids and will give small rx for percocet.  Discharged with strict return precautions.  Pt agreeable with plan.        Ethelda Chick, MD 07/28/12 [phone]

## 2012-07-28 NOTE — ED Notes (Signed)
Patient states he has a history if esophagitis.  States he was seen here 2 days ago and given a prescription for pain medications.  States he is almost out of the percocet and is here today for a refill.  States his doctor and gi specialist is off today.

## 2012-08-17 ENCOUNTER — Encounter (HOSPITAL_BASED_OUTPATIENT_CLINIC_OR_DEPARTMENT_OTHER): Payer: Self-pay | Admitting: Emergency Medicine

## 2012-08-17 ENCOUNTER — Emergency Department (HOSPITAL_BASED_OUTPATIENT_CLINIC_OR_DEPARTMENT_OTHER)
Admission: EM | Admit: 2012-08-17 | Discharge: 2012-08-18 | Disposition: A | Discharge status: Home / Self Care | Payer: BC Managed Care – PPO | Attending: Emergency Medicine | Admitting: Emergency Medicine

## 2012-08-17 DIAGNOSIS — Z8739 Personal history of other diseases of the musculoskeletal system and connective tissue: Secondary | ICD-10-CM | POA: Insufficient documentation

## 2012-08-17 DIAGNOSIS — F17200 Nicotine dependence, unspecified, uncomplicated: Secondary | ICD-10-CM | POA: Insufficient documentation

## 2012-08-17 DIAGNOSIS — Z79899 Other long term (current) drug therapy: Secondary | ICD-10-CM | POA: Insufficient documentation

## 2012-08-17 DIAGNOSIS — K219 Gastro-esophageal reflux disease without esophagitis: Secondary | ICD-10-CM | POA: Insufficient documentation

## 2012-08-17 DIAGNOSIS — F411 Generalized anxiety disorder: Secondary | ICD-10-CM | POA: Insufficient documentation

## 2012-08-17 DIAGNOSIS — K209 Esophagitis, unspecified without bleeding: Secondary | ICD-10-CM | POA: Insufficient documentation

## 2012-08-17 DIAGNOSIS — Z8619 Personal history of other infectious and parasitic diseases: Secondary | ICD-10-CM | POA: Insufficient documentation

## 2012-08-17 HISTORY — DX: Depression, unspecified: F32.A

## 2012-08-17 HISTORY — DX: Major depressive disorder, single episode, unspecified: F32.9

## 2012-08-17 MED ORDER — GI COCKTAIL ~~LOC~~
30.0000 mL | Freq: Once | ORAL | Status: AC
  Administered 2012-08-18: 30 mL via ORAL
  Filled 2012-08-17: qty 30

## 2012-08-17 NOTE — ED Notes (Signed)
Pt c/o difficulty swallowing. Pt has known hx esophagitis, and has been seen here x2 previously for same. Pt states "they only gave me 12 pain pills last time. That doesn't last long"

## 2012-08-17 NOTE — ED Notes (Signed)
Pt states the percocet and prednisone he got here last time helped, but pt doesn't like taking the prednisone.

## 2012-08-17 NOTE — ED Notes (Signed)
Pt states seen here previously and sent home w/ prednisone that relieved similar symptoms. Pt states he tried to follow up with GI referral but "they didn't call me back." When asked pt if he tried calling GI again, he said he was feeling better/symptoms gone. Pt states prednisone helped but "messed him up" coming off of it. Pt denies any dysphagia at this time, only c/o mid epigastric pain.

## 2012-08-17 NOTE — ED Notes (Signed)
Explained to pt small amount of pain medications are prescribed in ED until follow up can be made. Pt was seen here Thanksgiving. Pt states he called for follow up but no one has called him back.

## 2012-08-18 ENCOUNTER — Encounter (HOSPITAL_BASED_OUTPATIENT_CLINIC_OR_DEPARTMENT_OTHER): Payer: Self-pay | Admitting: Emergency Medicine

## 2012-08-18 MED ORDER — HYDROCODONE-ACETAMINOPHEN 7.5-500 MG/15ML PO SOLN: 15.0000 mL | Freq: Four times a day (QID) | ORAL | Status: DC | PRN

## 2012-08-18 MED ORDER — SUCRALFATE 1 GM/10ML PO SUSP: 1.0000 g | Freq: Four times a day (QID) | ORAL | Status: DC

## 2012-08-18 NOTE — ED Provider Notes (Signed)
History     CSN: [id number]  Arrival date & time 08/17/12  2316   First MD Initiated Contact with Patient 08/17/12 2355      Chief Complaint  Patient presents with  . Abdominal Pain    (Consider location/radiation/quality/duration/timing/severity/associated sxs/prior treatment) Patient is a 56 y.o. male presenting with abdominal pain. The history is provided by the patient.  Abdominal Pain The primary symptoms of the illness include abdominal pain. The primary symptoms of the illness do not include shortness of breath, nausea or vomiting. The current episode started more than 2 days ago. The onset of the illness was gradual. The problem has not changed since onset. The abdominal pain began more than 2 days ago. The pain came on gradually. The abdominal pain has been unchanged since its onset. Pain Location: epigastrum particularly pain with swallowing. The abdominal pain does not radiate. The abdominal pain is relieved by nothing. Exacerbated by: none.  The patient has not had a change in bowel habit. Risk factors: esophagitis. Significant associated medical issues include GERD and liver disease.    Past Medical History  Diagnosis Date  . Fibromyalgia   . Neck pain   . Anxiety   . Esophageal reflux     eosinophil esophagitis  . Acute hepatitis C without mention of hepatic coma     Past Surgical History  Procedure Date  . Shoulder surgery     No family history on file.  History  Substance Use Topics  . Smoking status: Current Every Day Smoker -- 1.0 packs/day    Types: Cigarettes  . Smokeless tobacco: Not on file  . Alcohol Use: Yes     Comment: occasional      Review of Systems  HENT: Negative for drooling, trouble swallowing and voice change.   Respiratory: Negative for shortness of breath.   Cardiovascular: Negative for chest pain.  Gastrointestinal: Positive for abdominal pain. Negative for nausea and vomiting.  All other systems reviewed and are  negative.    Allergies  Tetanus toxoids  Home Medications   Current Outpatient Rx  Name  Route  Sig  Dispense  Refill  . CHLORDIAZEPOXIDE HCL 10 MG PO CAPS   Oral   Take 10 mg by mouth 4 (four) times daily as needed. For anxiety         . CYCLOBENZAPRINE HCL 10 MG PO TABS   Oral   Take 10 mg by mouth 3 (three) times daily as needed. For fibromyalgia         . DULOXETINE HCL 60 MG PO CPEP   Oral   Take 60 mg by mouth daily.           . MOMETASONE FUROATE 50 MCG/ACT NA SUSP   Nasal   Place 2 sprays into the nose daily.         Marland Kitchen OMEPRAZOLE 20 MG PO CPDR   Oral   Take 1 capsule (20 mg total) by mouth 2 (two) times daily.   30 capsule   0   . OXYCODONE-ACETAMINOPHEN 5-325 MG PO TABS   Oral   Take 1 tablet by mouth every 6 (six) hours as needed for pain.   12 tablet   0   . OXYCODONE-ACETAMINOPHEN 5-325 MG PO TABS   Oral   Take 1-2 tablets by mouth every 6 (six) hours as needed for pain.   12 tablet   0   . PANTOPRAZOLE SODIUM 40 MG PO TBEC   Oral   Take 40  mg by mouth daily as needed. For acid reflux         . PREDNISONE 20 MG PO TABS   Oral   Take 2 tablets (40 mg total) by mouth daily. Take 40 mg by mouth daily for 3 days, then 20mg  by mouth daily for 3 days, then 10mg  daily for 3 days   12 tablet   0   . PREGABALIN 75 MG PO CAPS   Oral   Take 75 mg by mouth every other day. For fibromyalgia         . SUCRALFATE 1 GM/10ML PO SUSP   Oral   Take 10 mLs (1 g total) by mouth 4 (four) times daily.   420 mL   0     BP 145/82  Pulse 72  Temp 98 F (36.7 C) (Oral)  Resp 18  SpO2 100%  Physical Exam  Constitutional: He is oriented to person, place, and time. He appears well-developed and well-nourished. No distress.  HENT:  Head: Normocephalic and atraumatic.  Mouth/Throat: Oropharynx is clear and moist.  Eyes: Conjunctivae normal are normal. Pupils are equal, round, and reactive to light.  Neck: Normal range of motion. Neck supple. No  tracheal deviation present.  Cardiovascular: Normal rate and regular rhythm.   Pulmonary/Chest: Effort normal and breath sounds normal. No stridor. He has no wheezes. He has no rales.  Abdominal: Soft. Bowel sounds are normal. He exhibits no mass. There is no tenderness. There is no rebound and no guarding.  Musculoskeletal: Normal range of motion.  Lymphadenopathy:    He has no cervical adenopathy.  Neurological: He is alert and oriented to person, place, and time.  Skin: Skin is warm and dry.  Psychiatric: He has a normal mood and affect.    ED Course  Procedures (including critical care time)  Labs Reviewed - No data to display No results found.   No diagnosis found.    MDM  Suspect patient may have some component of drug seeking behavior is not following up with GI  Case d/w Dr. Bosie Clos.  Patient to call in am for appt         Cormac Wint K Dayle Mcnerney-Rasch, MD 08/18/12 0040

## 2012-08-18 NOTE — ED Notes (Addendum)
Pt very became very argumentative with staff and cursing at this RN. Pt wanting pain medication now, however pt is driving. Explained to pt if he can have someone pick him up we could give him some pain medication once ride was visible in department. Pt called daughter in front of this RN who is not coming to pick him up. Pt dis-satisfied with receiving hydrocodone/apap for pain. Pt wants percocet. Pt then wanting pain medication to go. Explained to pt we could not dispense medication to go per law. Pt given a list of 24 hour pharmacies in the area, and driving instructions printed from computer to closest pharmacy. Security was present. Pt reluctant to leave, escorted out by security.

## 2012-08-26 IMAGING — CT CT ABD-PELV W/O CM
2 of 4 series · 16 of 46 positions shown, 18 images · non-contrast
Comparison: CT of the abdomen and pelvis performed 08/24/2010

CLINICAL DATA: Right flank pain and right-sided abdominal pain.

CT ABDOMEN AND PELVIS WITHOUT CONTRAST
TECHNIQUE: Multidetector CT imaging of the abdomen and pelvis was
performed following the standard protocol without intravenous
contrast.

[Series 2: renal stone > 200 lbs 5.0 b31f · axial · 0.76mm/px · z∈[+806,+1230]mm · 13 of 95 slices shown, 15 images]
[im 5/95  soft-tissue]
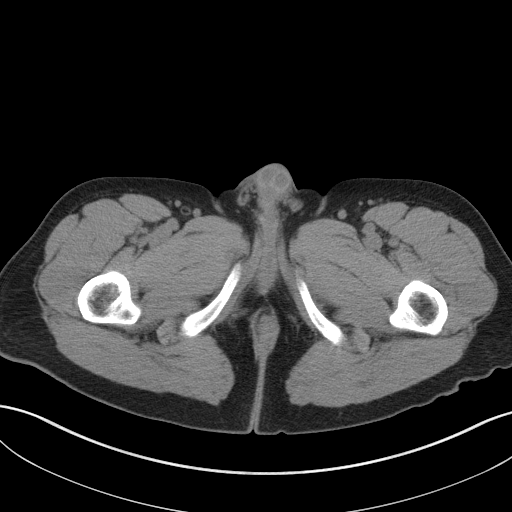
[im 5/95  bone]
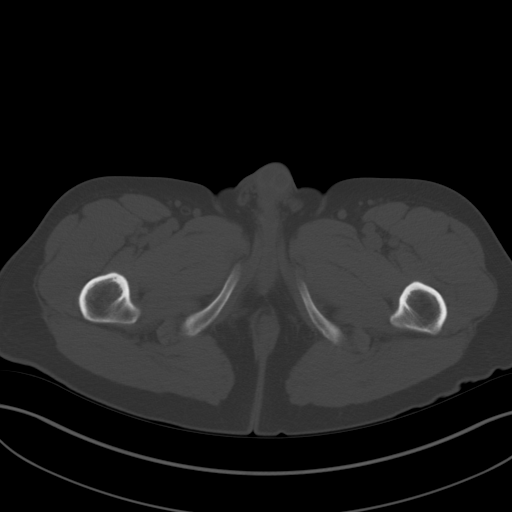
[im 13/95  soft-tissue]
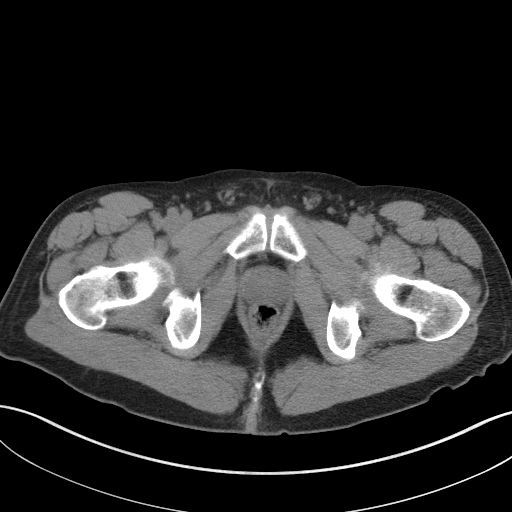
[im 21/95  soft-tissue]
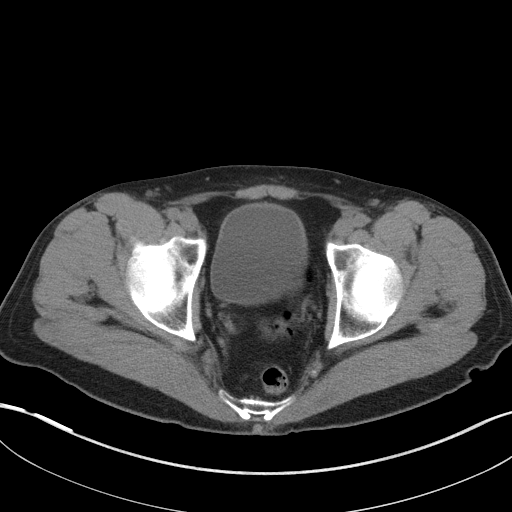
[im 25/95  soft-tissue]
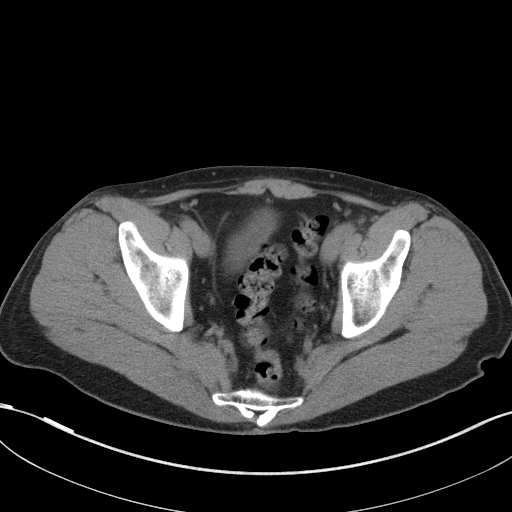
[im 33/95  soft-tissue]
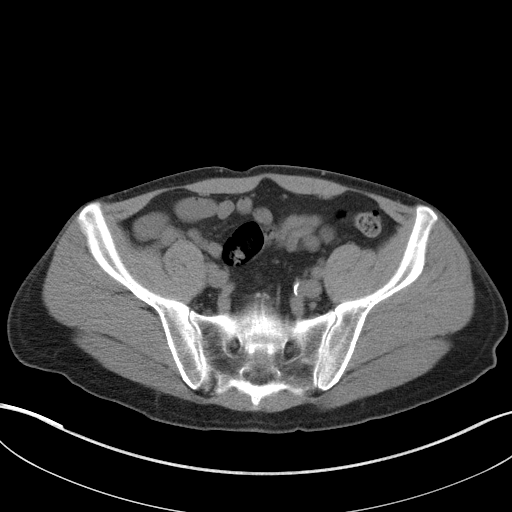
[im 41/95  soft-tissue]
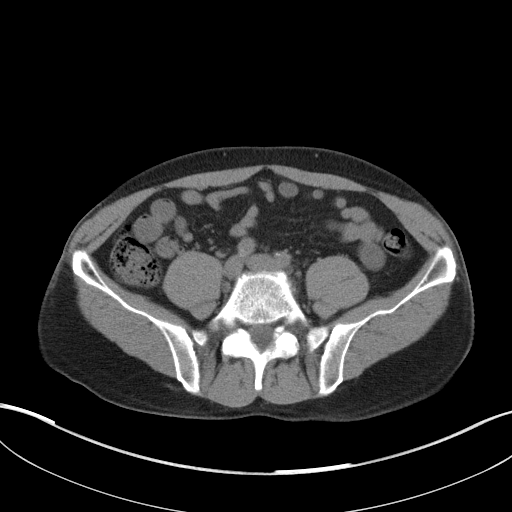
[im 50/95  soft-tissue]
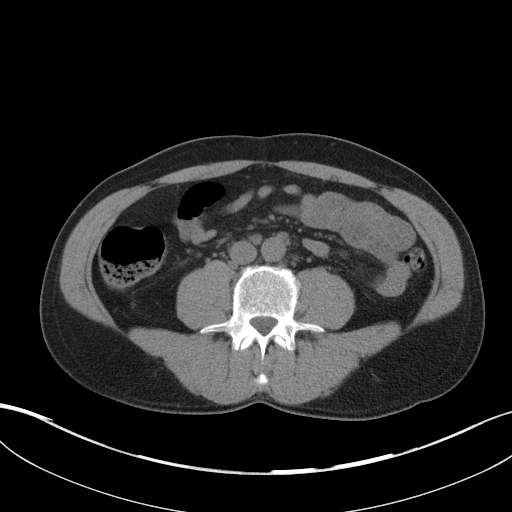
[im 54/95  soft-tissue]
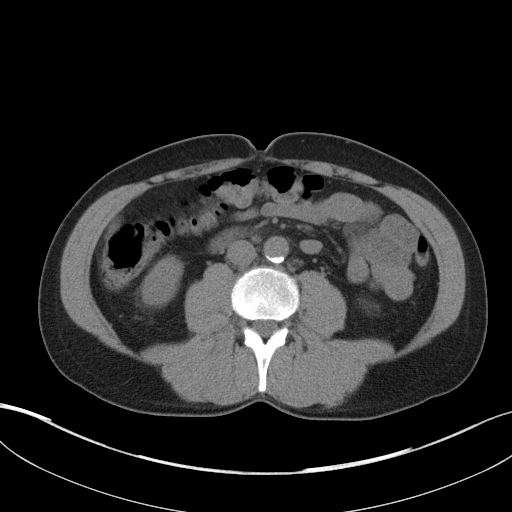
[im 62/95  soft-tissue]
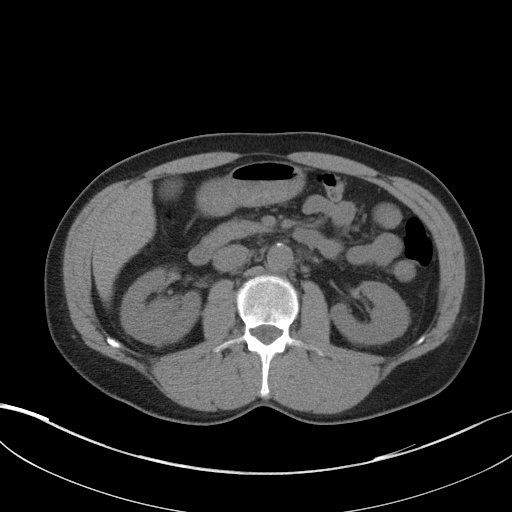
[im 62/95  bone]
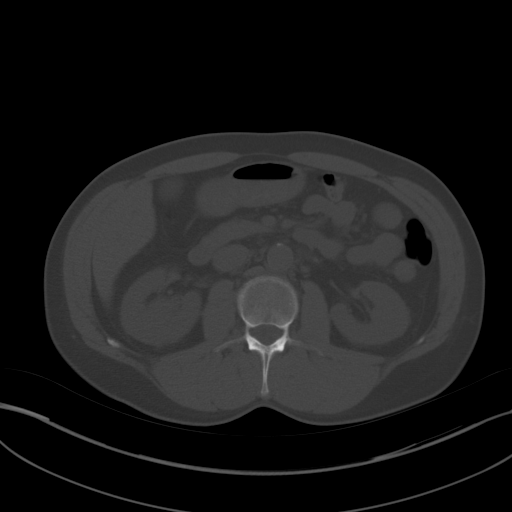
[im 70/95  soft-tissue]
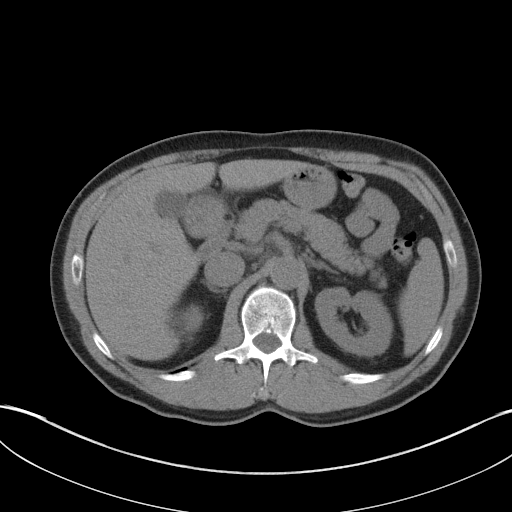
[im 74/95  soft-tissue]
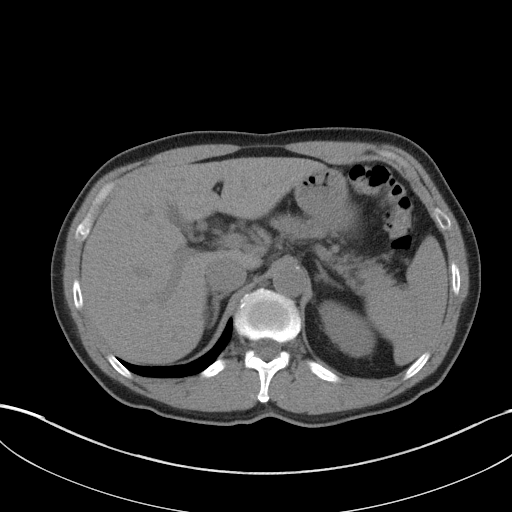
[im 82/95  soft-tissue]
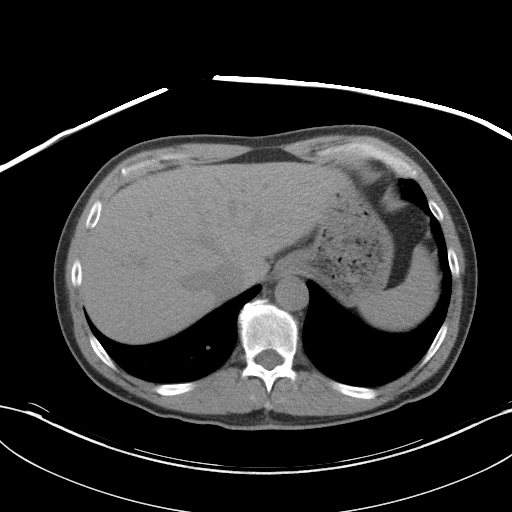
[im 90/95  soft-tissue]
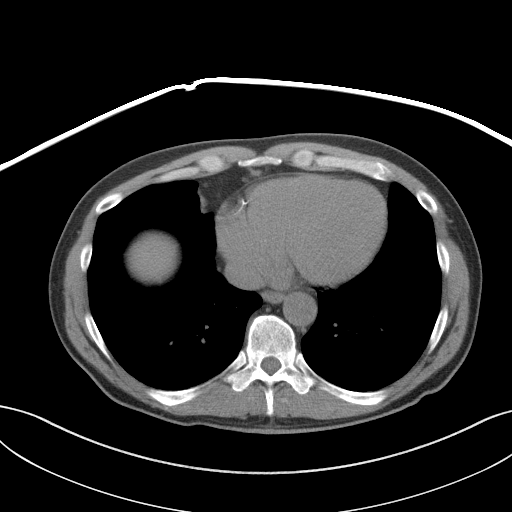

[Series 5: renal stone 3.0 coronal · coronal · 0.76mm/px · 3 of 78 slices shown]
[im 26/78  soft-tissue]
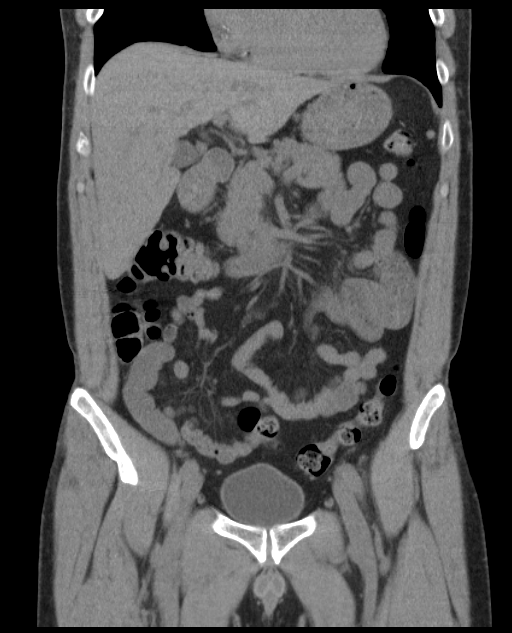
[im 35/78  soft-tissue]
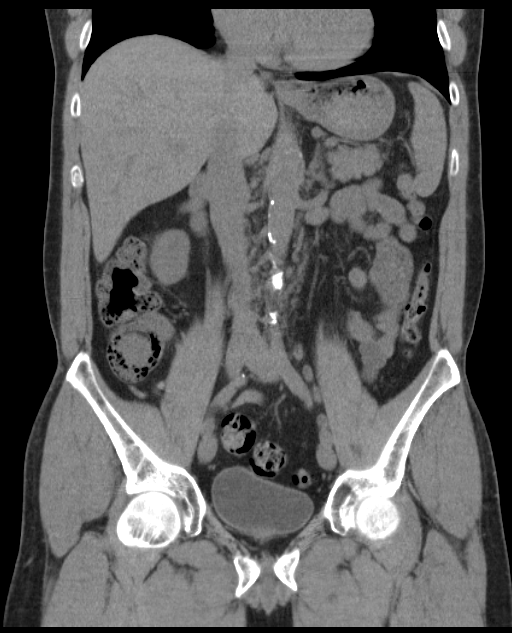
[im 43/78  soft-tissue]
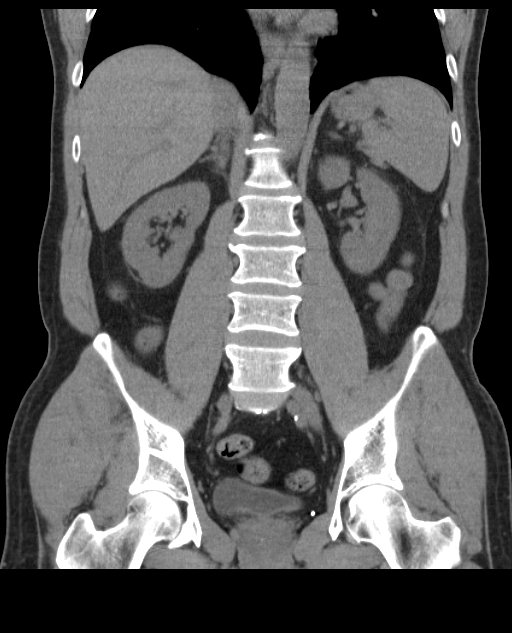

[16 of 46 positions shown; findings below may reference images not displayed]

FINDINGS: The visualized lung bases are clear.

The liver and spleen are unremarkable in appearance.  The
gallbladder is within normal limits.  The pancreas and adrenal
glands are unremarkable.

Nonspecific perinephric stranding is noted bilaterally.  The
kidneys are otherwise unremarkable in appearance.  There is no
evidence of hydronephrosis.  No renal or ureteral stones are
identified.

No free fluid is identified.  The small bowel is unremarkable in
appearance.  The stomach is within normal limits.  No acute
vascular abnormalities are seen.  Scattered calcification is noted
along the abdominal aorta and its branches.  Mildly prominent
periaortic and paracaval nodes are at the upper limits of normal
and appear stable from prior studies; no significant masses are
seen.

The appendix is normal in caliber and contains air, without
evidence for appendicitis.  The colon is unremarkable in
appearance.  Previously suggested thickening of the mucosa of the
rectum is less prominent on the current study.

The bladder is mildly distended and within normal limits.  The
prostate is normal in size.  No inguinal lymphadenopathy is seen.

No acute osseous abnormalities are identified.
IMPRESSION: 1.  No acute abnormalities identified within the abdomen or pelvis.
2.  Nonspecific perinephric stranding noted; kidneys otherwise
unremarkable.
3.  Mildly prominent stable periaortic and paracaval nodes, at the
upper limits of normal.

## 2012-11-18 ENCOUNTER — Encounter (HOSPITAL_BASED_OUTPATIENT_CLINIC_OR_DEPARTMENT_OTHER): Payer: Self-pay | Admitting: *Deleted

## 2012-11-18 ENCOUNTER — Observation Stay (HOSPITAL_BASED_OUTPATIENT_CLINIC_OR_DEPARTMENT_OTHER)
Admission: EM | Admit: 2012-11-18 | Discharge: 2012-11-20 | DRG: 182 | Disposition: A | Discharge status: Home / Self Care | Payer: BC Managed Care – PPO | Attending: Internal Medicine | Admitting: Internal Medicine

## 2012-11-18 ENCOUNTER — Emergency Department (HOSPITAL_BASED_OUTPATIENT_CLINIC_OR_DEPARTMENT_OTHER): Payer: BC Managed Care – PPO

## 2012-11-18 DIAGNOSIS — Z23 Encounter for immunization: Secondary | ICD-10-CM

## 2012-11-18 DIAGNOSIS — K200 Eosinophilic esophagitis: Principal | ICD-10-CM | POA: Diagnosis present

## 2012-11-18 DIAGNOSIS — Z79899 Other long term (current) drug therapy: Secondary | ICD-10-CM

## 2012-11-18 DIAGNOSIS — IMO0002 Reserved for concepts with insufficient information to code with codable children: Secondary | ICD-10-CM

## 2012-11-18 DIAGNOSIS — IMO0001 Reserved for inherently not codable concepts without codable children: Secondary | ICD-10-CM | POA: Diagnosis present

## 2012-11-18 DIAGNOSIS — B1920 Unspecified viral hepatitis C without hepatic coma: Secondary | ICD-10-CM | POA: Diagnosis present

## 2012-11-18 DIAGNOSIS — K219 Gastro-esophageal reflux disease without esophagitis: Secondary | ICD-10-CM | POA: Diagnosis present

## 2012-11-18 DIAGNOSIS — Z887 Allergy status to serum and vaccine status: Secondary | ICD-10-CM

## 2012-11-18 DIAGNOSIS — Z888 Allergy status to other drugs, medicaments and biological substances status: Secondary | ICD-10-CM

## 2012-11-18 DIAGNOSIS — F341 Dysthymic disorder: Secondary | ICD-10-CM | POA: Diagnosis present

## 2012-11-18 DIAGNOSIS — R079 Chest pain, unspecified: Secondary | ICD-10-CM

## 2012-11-18 DIAGNOSIS — B1710 Acute hepatitis C without hepatic coma: Secondary | ICD-10-CM | POA: Diagnosis present

## 2012-11-18 DIAGNOSIS — F17200 Nicotine dependence, unspecified, uncomplicated: Secondary | ICD-10-CM | POA: Diagnosis present

## 2012-11-18 LAB — CBC WITH DIFFERENTIAL/PLATELET
Basophils Absolute: 0 10*3/uL (ref 0.0–0.1)
Basophils Relative: 0 % (ref 0–1)
Eosinophils Absolute: 0.2 10*3/uL (ref 0.0–0.7)
Eosinophils Relative: 2 % (ref 0–5)
HCT: 44.9 % (ref 39.0–52.0)
Hemoglobin: 15.3 g/dL (ref 13.0–17.0)
Lymphocytes Relative: 40 % (ref 12–46)
Lymphs Abs: 3 10*3/uL (ref 0.7–4.0)
MCH: 31.5 pg (ref 26.0–34.0)
MCHC: 34.1 g/dL (ref 30.0–36.0)
MCV: 92.4 fL (ref 78.0–100.0)
Monocytes Absolute: 0.7 10*3/uL (ref 0.1–1.0)
Monocytes Relative: 9 % (ref 3–12)
Neutro Abs: 3.6 10*3/uL (ref 1.7–7.7)
Neutrophils Relative %: 49 % (ref 43–77)
Platelets: 205 10*3/uL (ref 150–400)
RBC: 4.86 MIL/uL (ref 4.22–5.81)
RDW: 12.8 % (ref 11.5–15.5)
WBC: 7.4 10*3/uL (ref 4.0–10.5)

## 2012-11-18 LAB — COMPREHENSIVE METABOLIC PANEL
ALT: 134 U/L — ABNORMAL HIGH (ref 0–53)
AST: 63 U/L — ABNORMAL HIGH (ref 0–37)
Albumin: 4.3 g/dL (ref 3.5–5.2)
Alkaline Phosphatase: 53 U/L (ref 39–117)
BUN: 15 mg/dL (ref 6–23)
CO2: 28 mEq/L (ref 19–32)
Calcium: 10.3 mg/dL (ref 8.4–10.5)
Chloride: 103 mEq/L (ref 96–112)
Creatinine, Ser: 0.9 mg/dL (ref 0.50–1.35)
GFR calc Af Amer: >90 mL/min (ref >90)
GFR calc non Af Amer: >90 mL/min (ref >90)
Glucose, Bld: 86 mg/dL (ref 70–99)
Potassium: 4.6 mEq/L (ref 3.5–5.1)
Sodium: 141 mEq/L (ref 135–145)
Total Bilirubin: 0.6 mg/dL (ref 0.3–1.2)
Total Protein: 7.7 g/dL (ref 6.0–8.3)

## 2012-11-18 LAB — TROPONIN I
Troponin I: <0.3 ng/mL (ref <0.30)
Troponin I: <0.3 ng/mL (ref <0.30)

## 2012-11-18 LAB — LIPASE, BLOOD: Lipase: 25 U/L (ref 11–59)

## 2012-11-18 MED ORDER — MORPHINE SULFATE 4 MG/ML IJ SOLN
4.0000 mg | Freq: Once | INTRAMUSCULAR | Status: AC
  Administered 2012-11-18: 4 mg via INTRAVENOUS
  Filled 2012-11-18: qty 1

## 2012-11-18 MED ORDER — PNEUMOCOCCAL VAC POLYVALENT 25 MCG/0.5ML IJ INJ
0.5000 mL | INJECTION | INTRAMUSCULAR | Status: AC
  Filled 2012-11-18: qty 0.5

## 2012-11-18 MED ORDER — GI COCKTAIL ~~LOC~~
30.0000 mL | Freq: Once | ORAL | Status: AC
  Administered 2012-11-18: 30 mL via ORAL
  Filled 2012-11-18: qty 30

## 2012-11-18 MED ORDER — NITROGLYCERIN 0.4 MG SL SUBL
0.4000 mg | SUBLINGUAL_TABLET | SUBLINGUAL | Status: DC | PRN
  Administered 2012-11-18: 0.4 mg via SUBLINGUAL
  Filled 2012-11-18: qty 25

## 2012-11-18 MED ORDER — PANTOPRAZOLE SODIUM 40 MG IV SOLR
40.0000 mg | Freq: Once | INTRAVENOUS | Status: AC
  Administered 2012-11-18: 40 mg via INTRAVENOUS
  Filled 2012-11-18: qty 40

## 2012-11-18 MED ORDER — KETOROLAC TROMETHAMINE 30 MG/ML IJ SOLN
30.0000 mg | Freq: Once | INTRAMUSCULAR | Status: AC
  Administered 2012-11-18: 30 mg via INTRAVENOUS
  Filled 2012-11-18: qty 1

## 2012-11-18 MED ORDER — ASPIRIN 81 MG PO CHEW
CHEWABLE_TABLET | ORAL | Status: AC
  Administered 2012-11-18: 324 mg via ORAL
  Filled 2012-11-18: qty 4

## 2012-11-18 MED ORDER — ASPIRIN 81 MG PO CHEW: 324.0000 mg | CHEWABLE_TABLET | Freq: Once | ORAL | Status: AC

## 2012-11-18 NOTE — ED Notes (Signed)
Pt requesting pain medication. States pain has increased to a six. MD notified. Medication ordered.

## 2012-11-18 NOTE — ED Notes (Signed)
Recalled [facility] to check on ETA for transport, spoke with Asher Muir.

## 2012-11-18 NOTE — ED Notes (Addendum)
Pt describes sharp midsternal CP and diaphoresis onset last p.m. "Comes and goes" Also c/o dizziness and weakness. Unable to sleep. Pt taken to ED2. Placed on monitor. EKG done. Dr. Judd Lien and Brett Canales, RN notified. Hx esophagitis.

## 2012-11-18 NOTE — ED Notes (Signed)
[facility] called for transport to [place] 

## 2012-11-18 NOTE — ED Provider Notes (Signed)
History    This chart was scribed for Anthony Lyons, MD scribed by [name] Sinning. The patient was seen in room MH02/MH02 at 15:24   CSN: [id number]  Arrival date & time 11/18/12  1507   First MD Initiated Contact with Patient 11/18/12 1520      Chief Complaint  Patient presents with  . Chest Pain    (Consider location/radiation/quality/duration/timing/severity/associated sxs/prior treatment) The history is provided by the patient and the spouse. No language interpreter was used.   [name] is a 57 y.o. male who presents to the Emergency Department complaining of  constant moderate midsternal CP, onset last night with associated nausea and left sided numbness.   Pt states last night he broke out in a sweat and that he began having CP, but notes he did not have a fever. He says he went to sleep and when he woke up this morning he was feeling better. States later this afternoon he began feeling CP sxs again.  The patient states he does not know if the pain was triggered by a particular food and he denies any strenuous activity today.   He denies hx of heart problems or heart disease, but explains he has eosinophilic esophagitis, which causes sxs that he believes is similar heart problem- related sxs. He says he had a stress test done many years ago and that it was normal. He also says he's also had an endoscopy performed two years ago. He also states he was treated for hemoptysis sxs a couple years ago, which he states they attributed to a tear in esophagus.   The pt explains the endoscopy was performed by, gastroenterologist, Dr. Sharrell Ku.  The patient states that he does not drink any alcohol as it aggravates hx of hep C.  Past Medical History  Diagnosis Date  . Fibromyalgia   . Neck pain   . Anxiety   . Esophageal reflux     eosinophil esophagitis  . Acute hepatitis C without mention of hepatic coma   . Depression     Past Surgical History  Procedure Laterality Date   . Shoulder surgery      History reviewed. No pertinent family history. Father has hx of MI. History  Substance Use Topics  . Smoking status: Current Every Day Smoker -- 1.00 packs/day    Types: Cigarettes  . Smokeless tobacco: Not on file  . Alcohol Use: Yes     Comment: occasional      Review of Systems  Constitutional: Positive for diaphoresis. Negative for fever.  Cardiovascular: Positive for chest pain.  Gastrointestinal: Positive for nausea.  Neurological: Positive for numbness.  All other systems reviewed and are negative.    Allergies  Tetanus toxoids  Home Medications   Current Outpatient Rx  Name  Route  Sig  Dispense  Refill  . chlordiazePOXIDE (LIBRIUM) 10 MG capsule   Oral   Take 10 mg by mouth 4 (four) times daily as needed. For anxiety         . cyclobenzaprine (FLEXERIL) 10 MG tablet   Oral   Take 10 mg by mouth 3 (three) times daily as needed. For fibromyalgia         . DULoxetine (CYMBALTA) 60 MG capsule   Oral   Take 60 mg by mouth daily.           Marland Kitchen HYDROcodone-acetaminophen (LORTAB) 7.5-500 MG/15ML solution   Oral   Take 15 mLs by mouth every 6 (six) hours as needed for  pain.   60 mL   0   . mometasone (NASONEX) 50 MCG/ACT nasal spray   Nasal   Place 2 sprays into the nose daily.         Marland Kitchen omeprazole (PRILOSEC) 20 MG capsule   Oral   Take 1 capsule (20 mg total) by mouth 2 (two) times daily.   30 capsule   0   . oxyCODONE-acetaminophen (PERCOCET) 5-325 MG per tablet   Oral   Take 1 tablet by mouth every 6 (six) hours as needed for pain.   12 tablet   0   . oxyCODONE-acetaminophen (PERCOCET/ROXICET) 5-325 MG per tablet   Oral   Take 1-2 tablets by mouth every 6 (six) hours as needed for pain.   12 tablet   0   . pantoprazole (PROTONIX) 40 MG tablet   Oral   Take 40 mg by mouth daily as needed. For acid reflux         . predniSONE (DELTASONE) 20 MG tablet   Oral   Take 2 tablets (40 mg total) by mouth daily.  Take 40 mg by mouth daily for 3 days, then 20mg  by mouth daily for 3 days, then 10mg  daily for 3 days   12 tablet   0   . pregabalin (LYRICA) 75 MG capsule   Oral   Take 75 mg by mouth every other day. For fibromyalgia         . sucralfate (CARAFATE) 1 GM/10ML suspension   Oral   Take 10 mLs (1 g total) by mouth 4 (four) times daily.   420 mL   0   . sucralfate (CARAFATE) 1 GM/10ML suspension   Oral   Take 10 mLs (1 g total) by mouth 4 (four) times daily.   420 mL   0     BP 147/90  Pulse 72  Temp(Src) 98.1 F (36.7 C) (Oral)  Resp 20  Ht 5\' 9"  (1.753 m)  Wt 190 lb (86.183 kg)  BMI 28.05 kg/m2  SpO2 98%  Physical Exam  Nursing note and vitals reviewed. Constitutional: He is oriented to person, place, and time. He appears well-developed and well-nourished. No distress.  HENT:  Head: Normocephalic and atraumatic.  Eyes: Conjunctivae and EOM are normal.  Neck: Neck supple. No tracheal deviation present.  Cardiovascular: Normal rate, regular rhythm and normal heart sounds.   No murmur heard. Pulmonary/Chest: Effort normal and breath sounds normal. No respiratory distress. He has no wheezes. He has no rales.  Abdominal: Soft. Bowel sounds are normal. He exhibits no distension. There is tenderness. There is no rebound and no guarding.  There is a mild tenderness to palpation in the epigastric region with no rebound or guarding.   Musculoskeletal: Normal range of motion.  Neurological: He is alert and oriented to person, place, and time. No sensory deficit.  Skin: Skin is warm and dry.  Psychiatric: He has a normal mood and affect. His behavior is normal.    ED Course  Procedures (including critical care time) DIAGNOSTIC STUDIES: Oxygen Saturation is 98% on room air, normal by my interpretation.    COORDINATION OF CARE: Labs Reviewed  COMPREHENSIVE METABOLIC PANEL - Abnormal; Notable for the following:    AST 63 (*)    ALT 134 (*)    All other components within  normal limits  CBC WITH DIFFERENTIAL  TROPONIN I  LIPASE, BLOOD  TROPONIN I   Dg Chest 2 View  11/18/2012  *RADIOLOGY REPORT*  Clinical Data:  Chest pain  CHEST - 2 VIEW  Comparison: 07/27/2012  Findings: Lungs are clear without infiltrate or effusion.  Negative for heart failure or mass lesion.  No change from the prior study.  IMPRESSION: No acute cardiopulmonary abnormality.   Original Report Authenticated By: Janeece Riggers, M.D.      No diagnosis found.   Date: 11/18/2012  Rate: 74  Rhythm: normal sinus rhythm  QRS Axis: normal  Intervals: normal  ST/T Wave abnormalities: normal  Conduction Disutrbances:none  Narrative Interpretation:   Old EKG Reviewed: unchanged    MDM  The patient presents here with chest discomfort that started last night and has been associated with diaphoresis.  He has a history of eosinophilic esophagitis and thought it was related to that.  I have checked two troponins and ekg, both are not reflective of a cardiac etiology.  However, he is not feeling any better despite morphine, a gi cocktail, and iv protonix.  He continues to have significant discomfort and I will make arrangements for transfer to [facility] for further workup and observation.  Dr. Julian Reil agrees to accept in transfer.     I personally performed the services described in this documentation, which was scribed in my presence. The recorded information has been reviewed and is accurate.           Anthony Lyons, MD 11/18/12 [phone]

## 2012-11-18 NOTE — ED Notes (Signed)
[facility] here to transport pt to [facility]. Called to 2900 to update that pt was being transported and received pain medications prior to transport.

## 2012-11-18 NOTE — ED Notes (Signed)
[facility] called for transport to [place].  

## 2012-11-18 NOTE — ED Notes (Addendum)
Report called to [facility] on 2900

## 2012-11-19 ENCOUNTER — Encounter (HOSPITAL_COMMUNITY): Payer: Self-pay | Admitting: Internal Medicine

## 2012-11-19 DIAGNOSIS — K200 Eosinophilic esophagitis: Principal | ICD-10-CM | POA: Diagnosis present

## 2012-11-19 LAB — CBC
HCT: 44.3 % (ref 39.0–52.0)
Hemoglobin: 15.3 g/dL (ref 13.0–17.0)
MCH: 31.2 pg (ref 26.0–34.0)
MCHC: 34.5 g/dL (ref 30.0–36.0)
MCV: 90.2 fL (ref 78.0–100.0)
Platelets: 182 10*3/uL (ref 150–400)
RBC: 4.91 MIL/uL (ref 4.22–5.81)
RDW: 12.9 % (ref 11.5–15.5)
WBC: 7.9 10*3/uL (ref 4.0–10.5)

## 2012-11-19 LAB — BASIC METABOLIC PANEL
BUN: 20 mg/dL (ref 6–23)
CO2: 28 mEq/L (ref 19–32)
Calcium: 9.8 mg/dL (ref 8.4–10.5)
Chloride: 102 mEq/L (ref 96–112)
Creatinine, Ser: 1.18 mg/dL (ref 0.50–1.35)
GFR calc Af Amer: 77 mL/min — ABNORMAL LOW (ref >90)
GFR calc non Af Amer: 67 mL/min — ABNORMAL LOW (ref >90)
Glucose, Bld: 83 mg/dL (ref 70–99)
Potassium: 4.3 mEq/L (ref 3.5–5.1)
Sodium: 139 mEq/L (ref 135–145)

## 2012-11-19 LAB — TROPONIN I
Troponin I: <0.3 ng/mL (ref <0.30)
Troponin I: <0.3 ng/mL (ref <0.30)

## 2012-11-19 LAB — GLUCOSE, CAPILLARY: Glucose-Capillary: 157 mg/dL — ABNORMAL HIGH (ref 70–99)

## 2012-11-19 LAB — MRSA PCR SCREENING: MRSA by PCR: NEGATIVE

## 2012-11-19 MED ORDER — PREGABALIN 75 MG PO CAPS
75.0000 mg | ORAL_CAPSULE | ORAL | Status: DC
  Filled 2012-11-19: qty 3

## 2012-11-19 MED ORDER — HYDROMORPHONE HCL PF 1 MG/ML IJ SOLN
1.0000 mg | INTRAMUSCULAR | Status: DC | PRN
  Administered 2012-11-19 (×3): 1 mg via INTRAVENOUS
  Filled 2012-11-19 (×2): qty 1

## 2012-11-19 MED ORDER — CHLORDIAZEPOXIDE HCL 5 MG PO CAPS
10.0000 mg | ORAL_CAPSULE | Freq: Four times a day (QID) | ORAL | Status: DC | PRN
  Administered 2012-11-19: 10 mg via ORAL
  Filled 2012-11-19: qty 2

## 2012-11-19 MED ORDER — ALPRAZOLAM 0.5 MG PO TABS
1.0000 mg | ORAL_TABLET | Freq: Three times a day (TID) | ORAL | Status: DC | PRN
  Administered 2012-11-19 – 2012-11-20 (×2): 1 mg via ORAL
  Filled 2012-11-19: qty 1
  Filled 2012-11-19: qty 2
  Filled 2012-11-19: qty 1

## 2012-11-19 MED ORDER — PREDNISONE 20 MG PO TABS
20.0000 mg | ORAL_TABLET | Freq: Every day | ORAL | Status: DC
  Filled 2012-11-19: qty 1

## 2012-11-19 MED ORDER — PANTOPRAZOLE SODIUM 40 MG PO TBEC
80.0000 mg | DELAYED_RELEASE_TABLET | Freq: Every day | ORAL | Status: DC
  Administered 2012-11-19: 80 mg via ORAL
  Filled 2012-11-19: qty 2

## 2012-11-19 MED ORDER — ACETAMINOPHEN 325 MG PO TABS: 650.0000 mg | ORAL_TABLET | Freq: Four times a day (QID) | ORAL | Status: DC | PRN

## 2012-11-19 MED ORDER — IBUPROFEN 200 MG PO TABS
200.0000 mg | ORAL_TABLET | Freq: Four times a day (QID) | ORAL | Status: DC | PRN
  Filled 2012-11-19: qty 2

## 2012-11-19 MED ORDER — PREDNISONE 20 MG PO TABS
40.0000 mg | ORAL_TABLET | Freq: Every day | ORAL | Status: AC
  Administered 2012-11-19: 40 mg via ORAL
  Filled 2012-11-19: qty 2

## 2012-11-19 MED ORDER — TRAMADOL HCL 50 MG PO TABS
50.0000 mg | ORAL_TABLET | Freq: Four times a day (QID) | ORAL | Status: DC | PRN
  Filled 2012-11-19 (×2): qty 1

## 2012-11-19 MED ORDER — HYDROMORPHONE HCL PF 1 MG/ML IJ SOLN
INTRAMUSCULAR | Status: AC
  Filled 2012-11-19: qty 1

## 2012-11-19 MED ORDER — GI COCKTAIL ~~LOC~~
30.0000 mL | Freq: Three times a day (TID) | ORAL | Status: DC | PRN
  Filled 2012-11-19: qty 30

## 2012-11-19 MED ORDER — DULOXETINE HCL 60 MG PO CPEP
60.0000 mg | ORAL_CAPSULE | Freq: Every day | ORAL | Status: DC
  Administered 2012-11-19: 60 mg via ORAL
  Filled 2012-11-19 (×2): qty 1

## 2012-11-19 MED ORDER — PREDNISONE 20 MG PO TABS: 40.0000 mg | ORAL_TABLET | Freq: Every day | ORAL | Status: DC

## 2012-11-19 MED ORDER — SUCRALFATE 1 GM/10ML PO SUSP
1.0000 g | Freq: Four times a day (QID) | ORAL | Status: DC
  Administered 2012-11-19 – 2012-11-20 (×5): 1 g via ORAL
  Filled 2012-11-19 (×8): qty 10

## 2012-11-19 MED ORDER — CYCLOBENZAPRINE HCL 10 MG PO TABS: 10.0000 mg | ORAL_TABLET | Freq: Three times a day (TID) | ORAL | Status: DC | PRN

## 2012-11-19 MED ORDER — ASPIRIN 81 MG PO CHEW
81.0000 mg | CHEWABLE_TABLET | Freq: Every day | ORAL | Status: DC
  Administered 2012-11-19: 81 mg via ORAL
  Filled 2012-11-19: qty 1

## 2012-11-19 MED ORDER — SUCRALFATE 1 GM/10ML PO SUSP: 1.0000 g | Freq: Four times a day (QID) | ORAL | Status: DC

## 2012-11-19 MED ORDER — HEPARIN SODIUM (PORCINE) 5000 UNIT/ML IJ SOLN
5000.0000 [IU] | Freq: Three times a day (TID) | INTRAMUSCULAR | Status: DC
  Administered 2012-11-19 – 2012-11-20 (×5): 5000 [IU] via SUBCUTANEOUS
  Filled 2012-11-19 (×7): qty 1

## 2012-11-19 NOTE — Progress Notes (Addendum)
Teaching on pNA vaccine done .Handouts provided .Pt will "let (nurse) know " when he is ready for the vaccine .

## 2012-11-19 NOTE — Progress Notes (Addendum)
Pt feeling jittery ( restlestly walking around bed,gait steady ). Rt pupil = 6 mm w/sluggish rx to light and "bouncing effect". Lt pupil = 5 mm w/brisk rx to light the patient does simple math readily and correctly .Tracks well w/eyes.All extremities strong & = bilat . Pt states he feels pins and needles in fingers & toes of Lt extremities.HR sustained in 60's . RR= 24 before/p = 145/73 .SpO2 = 94% on RA . No other changes in cephalocaudel  assessment .Dr Sharon Seller aware. SEE Also 12-lead EKG.No changes in epigastric(mild) discomfort.  Late entry-  Called by RN to see pt due to above.  Came to bedside.  Pupils equal, round, and reactive to my exam.  No focal neurologic deficits noted on exam.  EKGs reviewed and without acute findngs.   Lonia Blood, MD [facility] Office  [phone] Pager [phone]  On-Call/Text Page:      [email or web address]      password [facility]

## 2012-11-19 NOTE — Progress Notes (Addendum)
[facility] Progress Note [place] TEAM 1 - Stepdown/ICU TEAM   Floyd Wade ZOX:[id number] DOB: 02/11/1956 DOA: 11/18/2012 PCP: ? Dr. Joycelyn Rua, MD  Brief narrative: 57 y.o. male with known history of esinophilic esophagitis who presented with c/o low chest / epigastric pain. Onset last night with associated nausea. Eating or drinking even water made the pain much worse - nothing seemed to make it better.  In the ED troponin was negative, EKG unremarkable.  He initially responded to a GI cocktail for perhaps 10 mins but his pain rapidly returned.   Assessment/Plan:  Epigastric / chest pain troponins negative x4 - EKG w/o acute findings - pain is not suggestive on USAP - no indication for ongoing SDU care - strongly suspect GI / esophageal source of pain - transfer to med bed - review of records reveals prior concern for drug seeking behavior - indeed, his reported pain appears inconsistent with the behavior I observed prior to entering the room (casual comfortable conversation w/ RN staff) - I feel it would be wise to avoid the use of narcotics, as this could also potentially worsen an element of gastroparesis that could be contributing   Hx of Eosinophilic esophagitis Per patient he cannot tolerate systemic glucocorticoids and they are "worse than the disease" - he does tell me this morning however that he was given a "low dose" prednisone pack via the ER that he did tolerate and that significantly improved his GI sx previously   Fibromyalgia  Depression with Anxiety 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 Kinnie Scales as outpt   Tobacco abuse  Counseled on need to abstain   Code Status: FULL Family Communication: no family present  Disposition Plan: transfer to medical bed  Consultants: none  Procedures: none  Antibiotics: none  DVT prophylaxis: SQ heparin   HPI/Subjective: Pt seen for a f/u  visit.  Objective: Blood pressure 104/57, pulse 65, temperature 97.7 F (36.5 C), temperature source Oral, resp. rate 17, height 5\' 9"  (1.753 m), weight 84.8 kg (186 lb 15.2 oz), SpO2 96.00%.  Intake/Output Summary (Last 24 hours) at 11/19/12 0909 Last data filed at 11/19/12 0600  Gross per 24 hour  Intake    100 ml  Output    250 ml  Net   -150 ml    Exam: F/U exam completed  Data Reviewed: Basic Metabolic Panel:  Recent Labs Lab 11/18/12 1540 11/19/12 0450  NA 141 139  K 4.6 4.3  CL 103 102  CO2 28 28  GLUCOSE 86 83  BUN 15 20  CREATININE 0.90 1.18  CALCIUM 10.3 9.8   Liver Function Tests:  Recent Labs Lab 11/18/12 1540  AST 63*  ALT 134*  ALKPHOS 53  BILITOT 0.6  PROT 7.7  ALBUMIN 4.3    Recent Labs Lab 11/18/12 1551  LIPASE 25   CBC:  Recent Labs Lab 11/18/12 1540 11/19/12 0450  WBC 7.4 7.9  NEUTROABS 3.6  --   HGB 15.3 15.3  HCT 44.9 44.3  MCV 92.4 90.2  PLT 205 182   Cardiac Enzymes:  Recent Labs Lab 11/18/12 1540 11/18/12 1745 11/19/12 0038 11/19/12 0450  TROPONINI <0.30 <0.30 <0.30 <0.30    Recent Results (from the past 240 hour(s))  MRSA PCR SCREENING     Status: None   Collection Time    11/18/12 10:50 PM      Result Value Range Status   MRSA by  PCR NEGATIVE  NEGATIVE Final   Comment:            The GeneXpert MRSA Assay (FDA     approved for NASAL specimens     only), is one component of a     comprehensive MRSA colonization     surveillance program. It is not     intended to diagnose MRSA     infection nor to guide or     monitor treatment for     MRSA infections.     Studies:  Recent x-ray studies have been reviewed in detail by the Attending Physician  Scheduled Meds:  Scheduled Meds: . aspirin  81 mg Oral Daily  . DULoxetine  60 mg Oral Daily  . heparin  5,000 Units Subcutaneous Q8H  . pantoprazole  80 mg Oral Daily  . pneumococcal 23 valent vaccine  0.5 mL Intramuscular Tomorrow-1000  . pregabalin   75 mg Oral QODAY  . sucralfate  1 g Oral QID   Continuous Infusions:   Time spent on care of this patient: 25+ mins   [facility]  [facility] Office  [phone] Pager - Text Page per Loretha Stapler as per below:  On-Call/Text Page:      [email or web address]      password [email or web address]  If 7PM-7AM, please contact night-coverage [email or web address] Password [email or web address] 11/19/2012, 9:09 AM   LOS: 1 day

## 2012-11-19 NOTE — Progress Notes (Signed)
Pt with order to transfer to 5531 med-surg. V/S stable. Report given to [facility]. Left a message to Waldemar Dickens( pt (Pt's wife) informing pt's transfer. No c/o of any discomfort/ pain at this time. [facility]  Called. Transferred.

## 2012-11-19 NOTE — H&P (Signed)
[facility] History and Physical  Loris Winrow WUX:[id number] DOB: 1955/12/21 DOA: 11/18/2012  Referring physician: ED PCP: No primary provider on file.  Specialists: None  Chief Complaint: Chest and abdominal pain  HPI: Vannie Hochstetler is a 57 y.o. male with known history of esinophilic esophagitis who presents with c/o recurrence of similar symptoms.  Specifically complains of low chest / epigastric pain.  Onset last night with associated nausea.  Eating or drinking even water makes the pain much worse he states, nothing seems to make it better.  In ED troponin was negative, EKG unremarkable, he initially responded to a GI cocktail for perhaps 10 mins but his pain rapidly returned.  Hospitalist has been asked to admit.  Review of Systems: 12 systems reviewed and otherwise negative. Past Medical History  Diagnosis Date  . Fibromyalgia   . Neck pain   . Anxiety   . Esophageal reflux     eosinophil esophagitis  . Acute hepatitis C without mention of hepatic coma   . Depression    Past Surgical History  Procedure Laterality Date  . Shoulder surgery     Social History:  reports that he has been smoking Cigarettes.  He has been smoking about 0.75 packs per day. He does not have any smokeless tobacco history on file. He reports that  drinks alcohol. He reports that he does not use illicit drugs.   Allergies  Allergen Reactions  . Prednisone     States that this med makes him "crazy"  . Tetanus Toxoids Swelling    Fever    Family History  Problem Relation Age of Onset  . Autoimmune disease Neg Hx     Prior to Admission medications   Medication Sig Start Date End Date Taking? Authorizing Provider  chlordiazePOXIDE (LIBRIUM) 10 MG capsule Take 10 mg by mouth 4 (four) times daily as needed. For anxiety    Historical Provider, MD  cyclobenzaprine (FLEXERIL) 10 MG tablet Take 10 mg by mouth 3 (three) times daily as needed. For fibromyalgia    Historical Provider, MD   DULoxetine (CYMBALTA) 60 MG capsule Take 60 mg by mouth daily.      Historical Provider, MD  HYDROcodone-acetaminophen (LORTAB) 7.5-500 MG/15ML solution Take 15 mLs by mouth every 6 (six) hours as needed for pain. 08/18/12   April K Palumbo-Rasch, MD  mometasone (NASONEX) 50 MCG/ACT nasal spray Place 2 sprays into the nose daily.    Historical Provider, MD  omeprazole (PRILOSEC) 20 MG capsule Take 1 capsule (20 mg total) by mouth 2 (two) times daily. 07/27/12   Tobin Chad, MD  oxyCODONE-acetaminophen (PERCOCET) 5-325 MG per tablet Take 1 tablet by mouth every 6 (six) hours as needed for pain. 07/27/12   Tobin Chad, MD  oxyCODONE-acetaminophen (PERCOCET/ROXICET) 5-325 MG per tablet Take 1-2 tablets by mouth every 6 (six) hours as needed for pain. 07/28/12   Ethelda Chick, MD  pantoprazole (PROTONIX) 40 MG tablet Take 40 mg by mouth daily as needed. For acid reflux    Historical Provider, MD  predniSONE (DELTASONE) 20 MG tablet Take 2 tablets (40 mg total) by mouth daily. Take 40 mg by mouth daily for 3 days, then 20mg  by mouth daily for 3 days, then 10mg  daily for 3 days 07/27/12   Tobin Chad, MD  pregabalin (LYRICA) 75 MG capsule Take 75 mg by mouth every other day. For fibromyalgia    Historical Provider, MD  sucralfate (CARAFATE) 1 GM/10ML suspension Take 10 mLs (1 g  total) by mouth 4 (four) times daily. 07/27/12   Tobin Chad, MD  sucralfate (CARAFATE) 1 GM/10ML suspension Take 10 mLs (1 g total) by mouth 4 (four) times daily. 08/18/12   April K Palumbo-Rasch, MD   Physical Exam: Filed Vitals:   11/18/12 2124 11/18/12 2158 11/18/12 2315 11/19/12 0000  BP: 126/70 140/69 131/83 124/69  Pulse: 65 65 56 71  Temp:   98.2 F (36.8 C)   TempSrc:   Oral   Resp: 15  18 10   Height:   5\' 9"  (1.753 m)   Weight:   84.8 kg (186 lb 15.2 oz)   SpO2: 97% 98% 94% 96%    General:  NAD, resting comfortably in bed Eyes: PEERLA EOMI ENT: mucous membranes moist Neck: supple w/o  JVD Cardiovascular: RRR w/o MRG Respiratory: CTA B Abdomen: soft, tenderness in epigastric area and very mild tenderness over liver, nd, bs+ Skin: no rash nor lesion Musculoskeletal: MAE, full ROM all 4 extremities Psychiatric: normal tone and affect Neurologic: AAOx3, grossly non-focal  Labs on Admission:  Basic Metabolic Panel:  Recent Labs Lab 11/18/12 1540  NA 141  K 4.6  CL 103  CO2 28  GLUCOSE 86  BUN 15  CREATININE 0.90  CALCIUM 10.3   Liver Function Tests:  Recent Labs Lab 11/18/12 1540  AST 63*  ALT 134*  ALKPHOS 53  BILITOT 0.6  PROT 7.7  ALBUMIN 4.3    Recent Labs Lab 11/18/12 1551  LIPASE 25   No results found for this basename: AMMONIA,  in the last 168 hours CBC:  Recent Labs Lab 11/18/12 1540  WBC 7.4  NEUTROABS 3.6  HGB 15.3  HCT 44.9  MCV 92.4  PLT 205   Cardiac Enzymes:  Recent Labs Lab 11/18/12 1540 11/18/12 1745  TROPONINI <0.30 <0.30    BNP (last 3 results) No results found for this basename: PROBNP,  in the last 8760 hours CBG: No results found for this basename: GLUCAP,  in the last 168 hours  Radiological Exams on Admission: Dg Chest 2 View  11/18/2012  *RADIOLOGY REPORT*  Clinical Data: Chest pain  CHEST - 2 VIEW  Comparison: 07/27/2012  Findings: Lungs are clear without infiltrate or effusion.  Negative for heart failure or mass lesion.  No change from the prior study.  IMPRESSION: No acute cardiopulmonary abnormality.   Original Report Authenticated By: Janeece Riggers, M.D.     EKG: Independently reviewed.  Assessment/Plan Principal Problem:   Eosinophilic esophagitis  1.  Eosinophilic esophagitis - history most consistent with this, doubt that this represents cardiac pain given the HPI and PMH of the disease as well as epigastric tenderness on exam, non the less trending troponins and keeping patient on tele just in case.  Attempting to symptomatically treat with dilaudid prn and GI cocktail.  Per patient he  cannot tolerate systemic glucocorticoids and they are "worse than the disease", so will hold off on giving him these tonight.  I spoke with pharmacy as well about possibly trying to get some formulation of PO topical use fluticasone since this may also work for this but per pharmacy they cannot change the med in EPIC so it seems that trying to order it may just result in a medication error when the next shift comes on.  Likely will need GI consult in AM regarding what to do in this scenario.    Code Status: Full Code (must indicate code status--if unknown or must be presumed, indicate so) Family Communication:  Spoke with wife at bedside (indicate person spoken with, if applicable, with phone number if by telephone) Disposition Plan: Admit(indicate anticipated LOS)  Time spent: 70 min  Qunisha Bryk M. [facility] Pager [phone]  If 7PM-7AM, please contact night-coverage [email or web address] Password [facility] 11/19/2012, 12:49 AM

## 2012-11-20 ENCOUNTER — Inpatient Hospital Stay (HOSPITAL_COMMUNITY): Payer: BC Managed Care – PPO

## 2012-11-20 DIAGNOSIS — B1920 Unspecified viral hepatitis C without hepatic coma: Secondary | ICD-10-CM | POA: Diagnosis present

## 2012-11-20 LAB — COMPREHENSIVE METABOLIC PANEL
ALT: 114 U/L — ABNORMAL HIGH (ref 0–53)
AST: 50 U/L — ABNORMAL HIGH (ref 0–37)
Albumin: 4.1 g/dL (ref 3.5–5.2)
Alkaline Phosphatase: 50 U/L (ref 39–117)
BUN: 18 mg/dL (ref 6–23)
CO2: 27 mEq/L (ref 19–32)
Calcium: 9.7 mg/dL (ref 8.4–10.5)
Chloride: 101 mEq/L (ref 96–112)
Creatinine, Ser: 1.05 mg/dL (ref 0.50–1.35)
GFR calc Af Amer: 89 mL/min — ABNORMAL LOW (ref >90)
GFR calc non Af Amer: 77 mL/min — ABNORMAL LOW (ref >90)
Glucose, Bld: 85 mg/dL (ref 70–99)
Potassium: 3.9 mEq/L (ref 3.5–5.1)
Sodium: 139 mEq/L (ref 135–145)
Total Bilirubin: 0.6 mg/dL (ref 0.3–1.2)
Total Protein: 7.5 g/dL (ref 6.0–8.3)

## 2012-11-20 LAB — LIPASE, BLOOD: Lipase: 29 U/L (ref 11–59)

## 2012-11-20 MED ORDER — SUCRALFATE 1 GM/10ML PO SUSP: 1.0000 g | Freq: Four times a day (QID) | ORAL | Status: DC

## 2012-11-20 MED ORDER — FLUTICASONE PROPIONATE 50 MCG/ACT NA SUSP
2.0000 | Freq: Every day | NASAL | Status: DC
  Administered 2012-11-20: 2 via NASAL
  Filled 2012-11-20: qty 16

## 2012-11-20 MED ORDER — TRAMADOL HCL 50 MG PO TABS: 50.0000 mg | ORAL_TABLET | Freq: Four times a day (QID) | ORAL | Status: DC | PRN

## 2012-11-20 MED ORDER — FLUTICASONE PROPIONATE 50 MCG/ACT NA SUSP: 2.0000 | Freq: Every day | NASAL | Status: DC

## 2012-11-20 MED ORDER — LORAZEPAM 2 MG/ML IJ SOLN
0.5000 mg | INTRAMUSCULAR | Status: DC
  Administered 2012-11-20 (×2): 0.5 mg via INTRAVENOUS
  Filled 2012-11-20 (×2): qty 1

## 2012-11-20 NOTE — Discharge Summary (Signed)
Physician Discharge Summary  [name] ZOX:[id number] DOB: 1956/07/14 DOA: 11/18/2012  PCP: No primary provider on file.  Admit date: 11/18/2012 Discharge date: 11/20/2012  Time spent: 40 minutes  Recommendations for Outpatient Follow-up:  See Dr. Kinnie Scales at 8:00 am 3/25 to continue work up for abdominal pain.    Discharge Diagnoses:  Principal Problem:   Eosinophilic esophagitis Active Problems:   Hepatitis C   Discharge Condition: stable, tolerating clears.  Slightly anxious.  Diet recommendation: clears until otherwise indicated by Dr. Kinnie Scales.  Filed Weights   11/18/12 1518 11/18/12 2315 11/19/12 2058  Weight: 86.183 kg (190 lb) 84.8 kg (186 lb 15.2 oz) 84 kg (185 lb 3 oz)    History of present illness at the time of admission:  [name] is a 57 y.o. male with known history of esinophilic esophagitis who presents with c/o recurrence of similar symptoms. Specifically complains of low chest / epigastric pain. Onset last night with associated nausea. Eating or drinking even water makes the pain much worse he states, nothing seems to make it better.  In ED troponin was negative, EKG unremarkable, he initially responded to a GI cocktail for perhaps 10 mins but his pain rapidly returned. Hospitalist has been asked to admit.  Hospital Course:   Epigastric / chest pain  Troponins were checked and found to be negative x4 - EKG w/o acute findings.  A GI/esophageal source of pain was strongly suspected.  Lipase was 29 (wnl).  Abdominal Ultrasound was also negative for cholelithiasis.   I spoke with Dr. Kinnie Scales who will see him in the office at 8:00 am tomorrow morning.  Flonase has been started orally for eosinophilic esophagitis.  Review of records reveals prior concern for drug seeking behavior - will prescribe limited prescription for tramadol at discharge.   Hx of Eosinophilic esophagitis  Patient of Dr. Shela Commons. Kinnie Scales. Spoke with Dr. Kinnie Scales who is willing to see the patient in his  office in the morning at 8:00am.  Will Start oral flonase.  On carafate and Nexium.  Per patient he cannot tolerate systemic glucocorticoids and they are "worse than the disease" .  Fibromyalgia  Stable and quiet.  Depression with Anxiety d/o  On chronic klonopin   Mild transaminitis with Hx of Hepatitis C  Diagnosed in 1991 - followed by Dr. Kinnie Scales as outpt   Tobacco abuse  Counseled on need to abstain.  Will worsen esophagitis.  Discharge Exam: Filed Vitals:   11/19/12 2000 11/19/12 2058 11/19/12 2105 11/20/12 0614  BP: 123/65  105/63 116/72  Pulse: 66 56 68 67  Temp:   99 F (37.2 C) 97.7 F (36.5 C)  TempSrc:  Oral Oral Oral  Resp: 18 20 18 18   Height:  5\' 9"  (1.753 m)    Weight:  84 kg (185 lb 3 oz)    SpO2: 92% 93% 98% 97%    General: A&O, NAD, Calm.  Lying comfortably in bed. Cardiovascular: rrr  No m/r/g Respiratory: CTA, no w/c/r, no accessory muscle movement. Abdomen:  Soft,  Non tender in epigastrum, mildly tender in RLQ, +BS, no masses. Extremities:  Able to move all 4, 5/5 strength in each, no edema   Discharge Instructions     Medication List    STOP taking these medications       mometasone 50 MCG/ACT nasal spray  Commonly known as:  NASONEX      TAKE these medications       ALPRAZolam 1 MG tablet  Commonly known as:  XANAX  Take 1 mg by mouth 3 (three) times daily as needed for anxiety.     DULoxetine 60 MG capsule  Commonly known as:  CYMBALTA  Take 60 mg by mouth daily.     esomeprazole 40 MG capsule  Commonly known as:  NEXIUM  Take 40 mg by mouth daily before breakfast.     fluticasone 50 MCG/ACT nasal spray  Commonly known as:  FLONASE  Place 2 sprays into the nose daily. SPRAY INTO THROAT AND SWALLOW.  DO NOT SPRAY INTO NOSE.     sucralfate 1 GM/10ML suspension  Commonly known as:  CARAFATE  Take 10 mLs (1 g total) by mouth 4 (four) times daily.     traMADol 50 MG tablet  Commonly known as:  ULTRAM  Take 1 tablet (50 mg  total) by mouth every 6 (six) hours as needed.       Follow-up Information   Follow up with MEDOFF,JEFFREY R, MD On 11/21/2012. (Please arrive at 8:00.  Have only clear liquids (and your medications) prior to your appointment.)    Contact information:   [facility] [place] [address] [phone]        The results of significant diagnostics from this hospitalization (including imaging, microbiology, ancillary and laboratory) are listed below for reference.    Significant Diagnostic Studies: Dg Chest 2 View  11/18/2012  *RADIOLOGY REPORT*  Clinical Data: Chest pain  CHEST - 2 VIEW  Comparison: 07/27/2012  Findings: Lungs are clear without infiltrate or effusion.  Negative for heart failure or mass lesion.  No change from the prior study.  IMPRESSION: No acute cardiopulmonary abnormality.   Original Report Authenticated By: Janeece Riggers, M.D.     Microbiology: Recent Results (from the past 240 hour(s))  MRSA PCR SCREENING     Status: None   Collection Time    11/18/12 10:50 PM      Result Value Range Status   MRSA by PCR NEGATIVE  NEGATIVE Final   Comment:            The GeneXpert MRSA Assay (FDA     approved for NASAL specimens     only), is one component of a     comprehensive MRSA colonization     surveillance program. It is not     intended to diagnose MRSA     infection nor to guide or     monitor treatment for     MRSA infections.     Labs: Basic Metabolic Panel:  Recent Labs Lab 11/18/12 1540 11/19/12 0450 11/20/12 0653  NA 141 139 139  K 4.6 4.3 3.9  CL 103 102 101  CO2 28 28 27   GLUCOSE 86 83 85  BUN 15 20 18   CREATININE 0.90 1.18 1.05  CALCIUM 10.3 9.8 9.7   Liver Function Tests:  Recent Labs Lab 11/18/12 1540 11/20/12 0653  AST 63* 50*  ALT 134* 114*  ALKPHOS 53 50  BILITOT 0.6 0.6  PROT 7.7 7.5  ALBUMIN 4.3 4.1    Recent Labs Lab 11/18/12 1551 11/20/12 0930  LIPASE 25 29   CBC:  Recent Labs Lab 11/18/12 1540  11/19/12 0450  WBC 7.4 7.9  NEUTROABS 3.6  --   HGB 15.3 15.3  HCT 44.9 44.3  MCV 92.4 90.2  PLT 205 182   Cardiac Enzymes:  Recent Labs Lab 11/18/12 1540 11/18/12 1745 11/19/12 0038 11/19/12 0450  TROPONINI <0.30 <0.30 <0.30 <0.30   CBG:  Recent Labs  Lab 11/19/12 1411  GLUCAP 157*    Signed:  Conley Canal  [facility] 11/20/2012, 12:30 PM  Attending Patient seen and examined, agree with the above assessment and plan as outlined above. Abdominal ultrasound is negative for cholelithiasis. Pain is symptomatically significantly better, abdomen is very benign on exam and is very soft. He is tolerating a diet and is stable to be discharged home, to followup with Dr. Kinnie Scales tomorrow morning at 8 AM.  S Maridee Slape

## 2012-11-20 NOTE — Progress Notes (Signed)
Pt. discharged to floor,verbalized understanding of discharged instruction,medication,restriction,diet and follow up appointment.Baseline Vitals sign stable,Pt comfortable,no sign and symptom of distress. 

## 2012-11-20 NOTE — Progress Notes (Signed)
[facility] Progress Note Anthony TEAM 1 - Stepdown/ICU TEAM   Judd Villa ZOX:[id number] DOB: 10-24-1955 DOA: 11/18/2012 PCP: ? Dr. Joycelyn Rua, MD  Brief narrative: 58 y.o. male with known history of esinophilic esophagitis who presented with c/o low chest / epigastric pain. Onset last night with associated nausea. Eating or drinking even water made the pain much worse - nothing seemed to make it better.  In the ED troponin was negative, EKG unremarkable.  He initially responded to a GI cocktail for perhaps 10 mins but his pain rapidly returned.   Assessment/Plan:  Epigastric / chest pain troponins negative x4 - EKG w/o acute findings strongly suspect GI / esophageal source of pain  Check Lipase Check Abdominal Ultrasound. Likely discharge this afternoon after U/S.   Will See Dr. Kinnie Scales in the Office on 3/25 at 8:00 am.   Review of records reveals prior concern for drug seeking behavior - Will not prescribe narcotics.  Hx of Eosinophilic esophagitis Patient of Dr. Shela Commons. Kinnie Scales.  Spoke with Dr. Kinnie Scales who is willing to see the patient in his office in the morning at 8:00am. Will Start oral flonase. On carafate Per patient he cannot tolerate systemic glucocorticoids and they are "worse than the disease" -but tolerated 3/23  Fibromyalgia  Depression with Anxiety 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 Kinnie Scales as outpt   Tobacco abuse  Counseled on need to abstain   Code Status: FULL Family Communication: no family present  Disposition Plan: likely discharge later today after U/S  Consultants: none  Procedures: none  Antibiotics: none  DVT prophylaxis: SQ heparin   HPI/Subjective: Pt seen for a f/u visit.  Objective: Blood pressure 116/72, pulse 67, temperature 97.7 F (36.5 C), temperature source Oral, resp. rate 18, height 5\' 9"  (1.753 m), weight 84 kg (185 lb 3 oz), SpO2  97.00%.  Intake/Output Summary (Last 24 hours) at 11/20/12 1029 Last data filed at 11/19/12 1800  Gross per 24 hour  Intake    600 ml  Output    675 ml  Net    -75 ml    Exam: F/U exam completed  Data Reviewed: Basic Metabolic Panel:  Recent Labs Lab 11/18/12 1540 11/19/12 0450 11/20/12 0653  NA 141 139 139  K 4.6 4.3 3.9  CL 103 102 101  CO2 28 28 27   GLUCOSE 86 83 85  BUN 15 20 18   CREATININE 0.90 1.18 1.05  CALCIUM 10.3 9.8 9.7   Liver Function Tests:  Recent Labs Lab 11/18/12 1540 11/20/12 0653  AST 63* 50*  ALT 134* 114*  ALKPHOS 53 50  BILITOT 0.6 0.6  PROT 7.7 7.5  ALBUMIN 4.3 4.1    Recent Labs Lab 11/18/12 1551 11/20/12 0930  LIPASE 25 29   CBC:  Recent Labs Lab 11/18/12 1540 11/19/12 0450  WBC 7.4 7.9  NEUTROABS 3.6  --   HGB 15.3 15.3  HCT 44.9 44.3  MCV 92.4 90.2  PLT 205 182   Cardiac Enzymes:  Recent Labs Lab 11/18/12 1540 11/18/12 1745 11/19/12 0038 11/19/12 0450  TROPONINI <0.30 <0.30 <0.30 <0.30    Recent Results (from the past 240 hour(s))  MRSA PCR SCREENING     Status: None   Collection Time    11/18/12 10:50 PM      Result Value Range Status   MRSA by PCR NEGATIVE  NEGATIVE Final   Comment:  The GeneXpert MRSA Assay (FDA     approved for NASAL specimens     only), is one component of a     comprehensive MRSA colonization     surveillance program. It is not     intended to diagnose MRSA     infection nor to guide or     monitor treatment for     MRSA infections.     Studies:  Recent x-ray studies have been reviewed in detail by the Attending Physician  Scheduled Meds:  Scheduled Meds: . DULoxetine  60 mg Oral Daily  . fluticasone  2 spray Each Nare Daily  . heparin  5,000 Units Subcutaneous Q8H  . LORazepam  0.5 mg Intravenous Q4H  . pantoprazole  80 mg Oral Daily  . pregabalin  75 mg Oral QODAY  . sucralfate  1 g Oral QID      Anthony Downs, PA-C [facility] Pager:  [phone]   If 7PM-7AM, please contact night-coverage [email or web address] Password [facility] 11/20/2012, 10:29 AM   LOS: 2 days    Attending Patient seen and examined, agree with the above assessment and plan. He is much better, pain has significantly decreased and has almost resolved per patient. We spoke with Dr. Kinnie Scales- he will followup patient tomorrow at 8 AM, since patient doing well, and has a follow up appointment tomorrow he can be discharged later today if the ultrasound of the abdomen does not show significant abnormalities.  S Arslan Kier

## 2012-11-21 NOTE — Progress Notes (Signed)
Utilization review complete. Corean Yoshimura RN CCM Case Mgmt phone [phone] 

## 2013-06-05 IMAGING — CT CT ABD-PELV W/ CM
2 of 5 series · 17 of 46 positions shown, 19 images · IV contrast (APPLIED)
Comparison: 10/28/2010

CLINICAL DATA: Lower abdominal pain.  Lower GI bleeding.  Nausea.

CT ABDOMEN AND PELVIS WITH CONTRAST
TECHNIQUE: Multidetector CT imaging of the abdomen and pelvis was
performed following the standard protocol during bolus
administration of intravenous contrast.
Contrast: 100mL OMNIPAQUE IOHEXOL 300 MG/ML IV SOLN

[Series 2: abd/pelvis 5.0 b31f · axial · 0.76mm/px · z∈[-365,+50]mm · 14 of 93 slices shown, 16 images]
[im 5/93  soft-tissue]
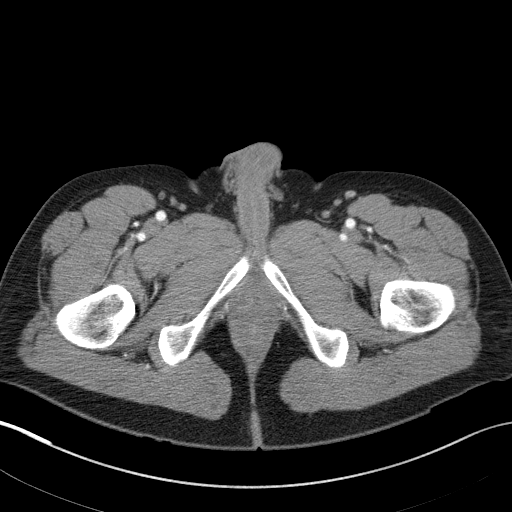
[im 5/93  bone]
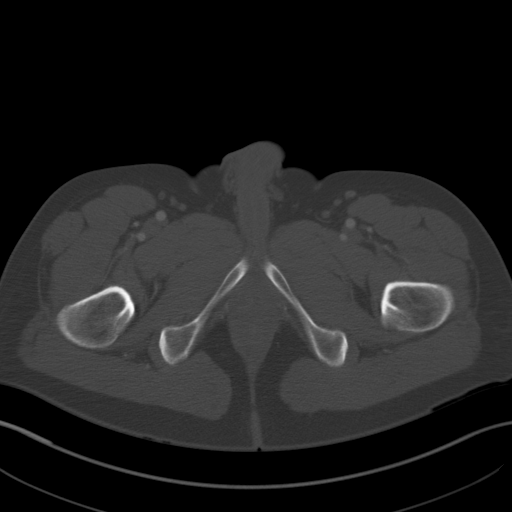
[im 10/93  soft-tissue]
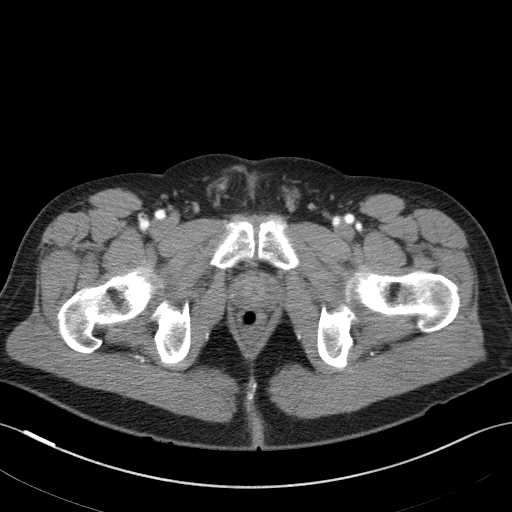
[im 20/93  soft-tissue]
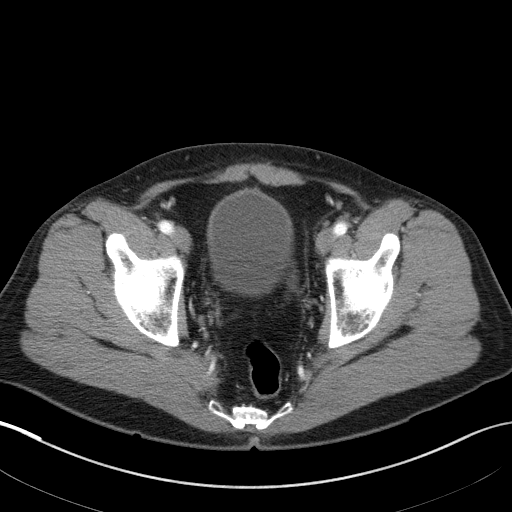
[im 25/93  soft-tissue]
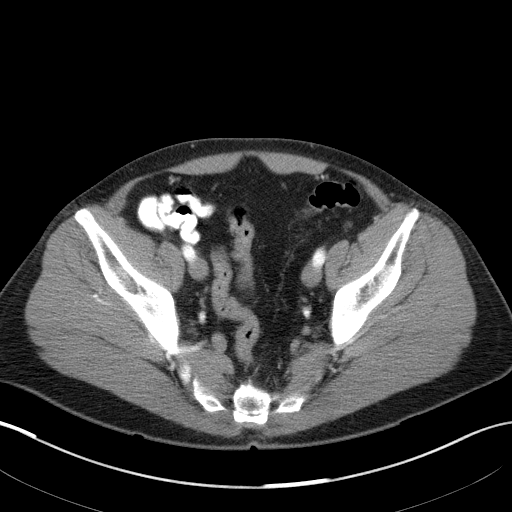
[im 30/93  soft-tissue]
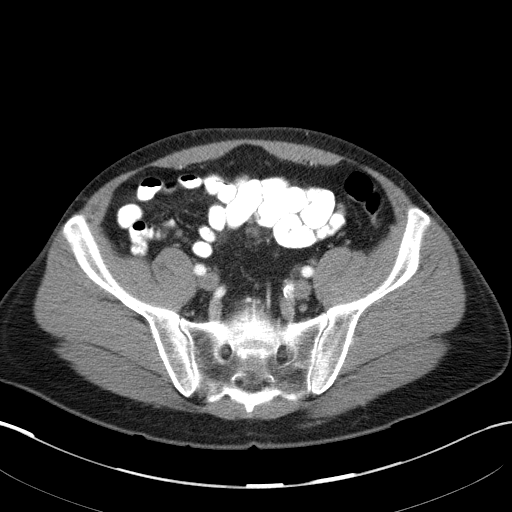
[im 39/93  soft-tissue]
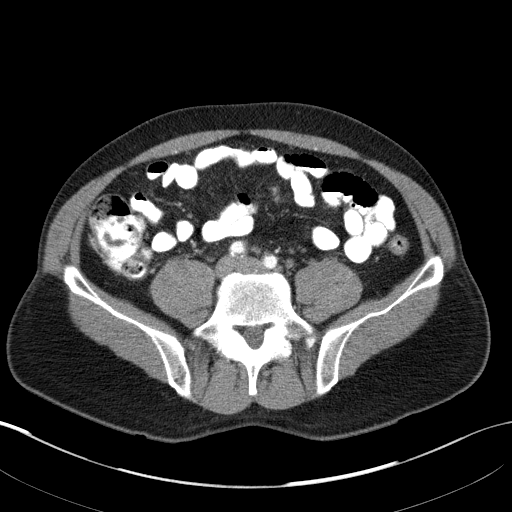
[im 44/93  soft-tissue]
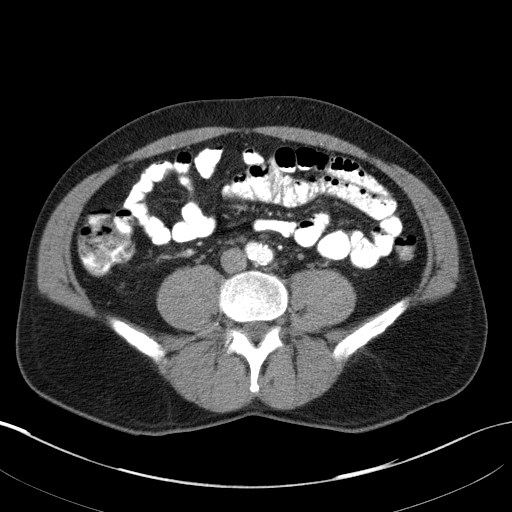
[im 49/93  soft-tissue]
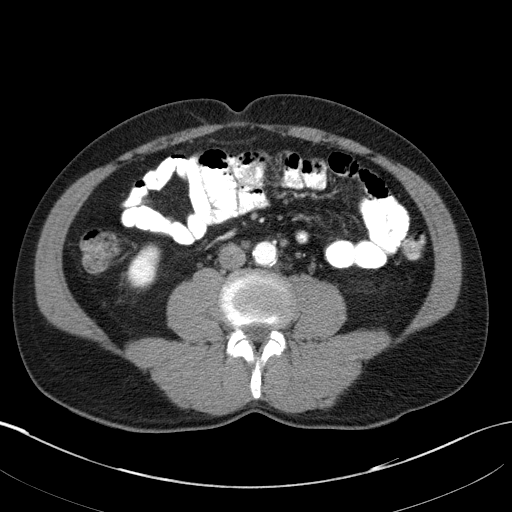
[im 54/93  soft-tissue]
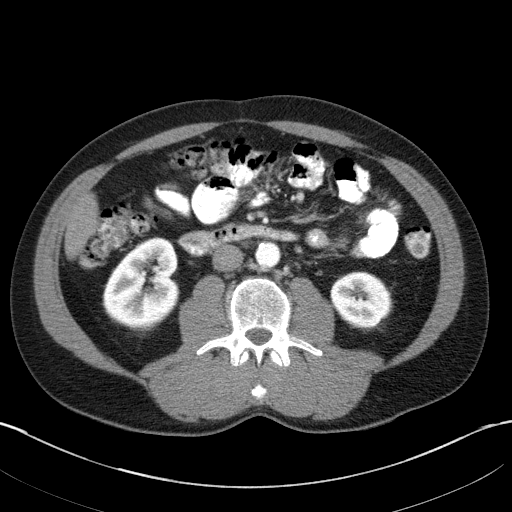
[im 54/93  bone]
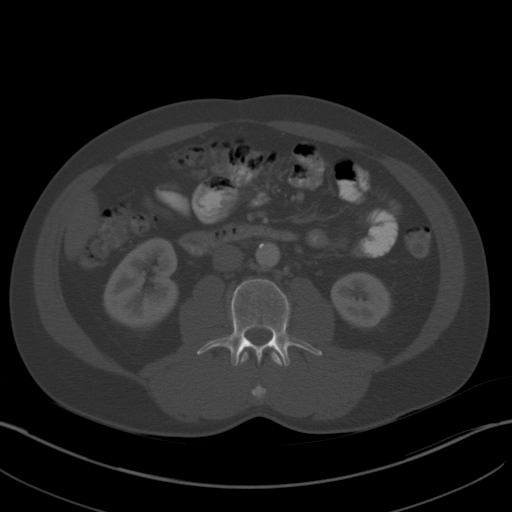
[im 63/93  soft-tissue]
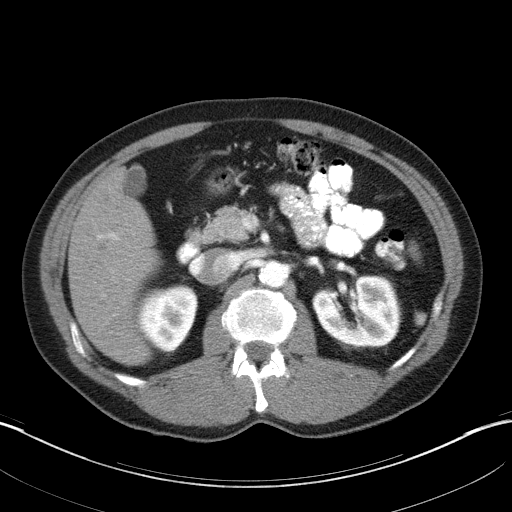
[im 68/93  soft-tissue]
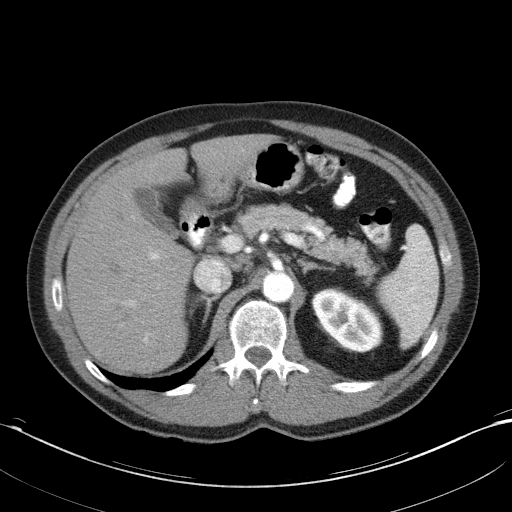
[im 73/93  soft-tissue]
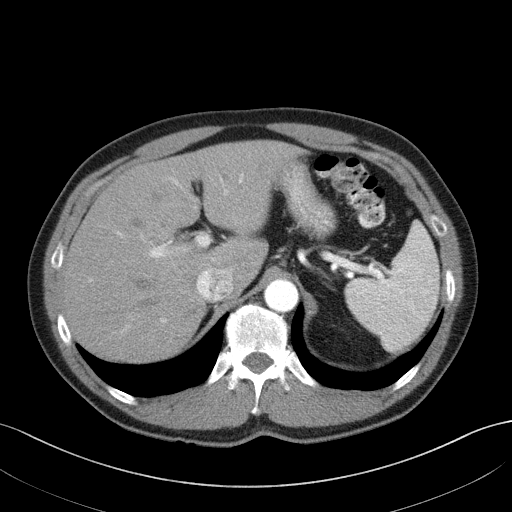
[im 83/93  soft-tissue]
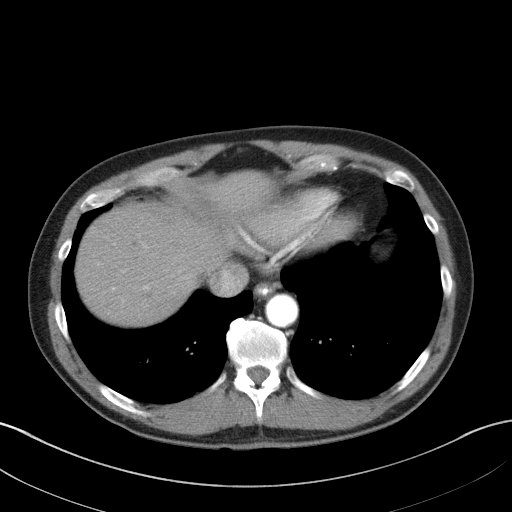
[im 88/93  soft-tissue]
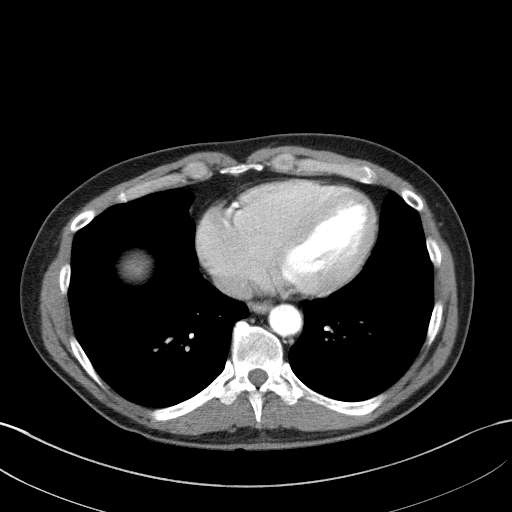

[Series 5: abd/pelvis 3.0 coronal · coronal · 0.98mm/px · 3 of 78 slices shown]
[im 26/78  soft-tissue]
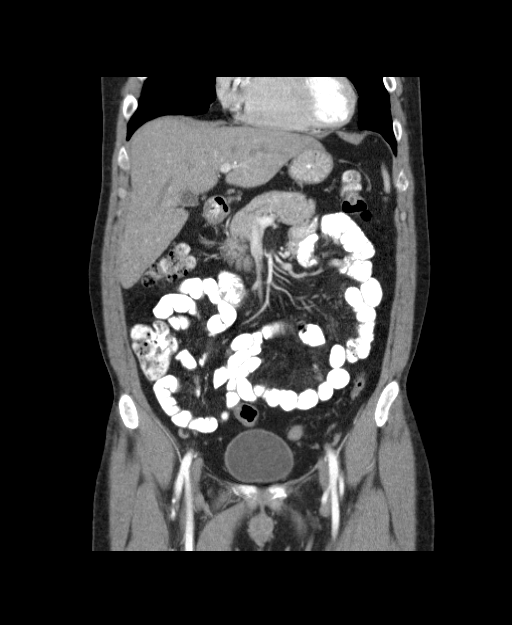
[im 35/78  soft-tissue]
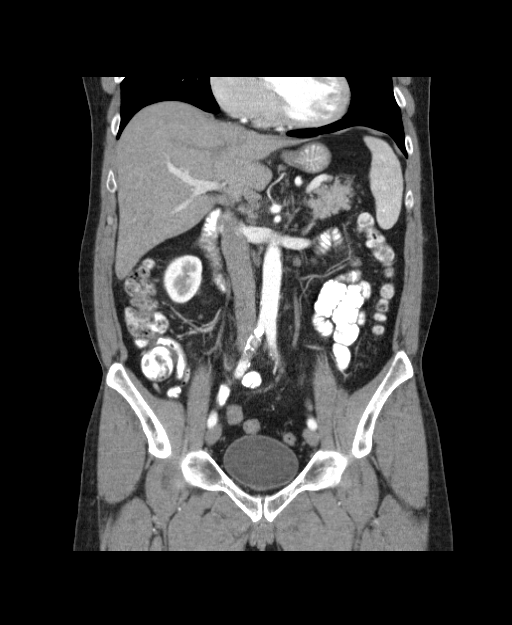
[im 43/78  soft-tissue]
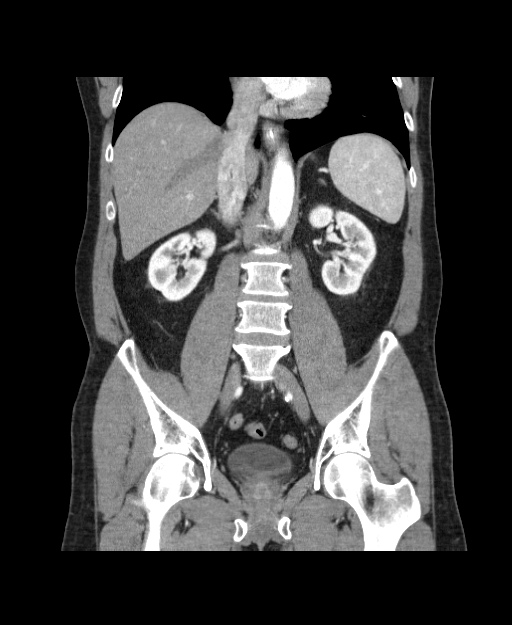

[17 of 46 positions shown; findings below may reference images not displayed]

FINDINGS: The abdominal parenchymal organs are normal in
appearance.  Gallbladder is unremarkable.  No evidence of
hydronephrosis.  No soft tissue masses or lymphadenopathy
identified within the abdomen or pelvis.

There is no evidence of inflammatory process or abnormal fluid
collections.  No evidence of bowel wall thickening or dilatation.
Normal appendix is visualized.
IMPRESSION: Negative.  No acute findings or other significant abnormality
identified.
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
## 2014-01-03 ENCOUNTER — Emergency Department (HOSPITAL_BASED_OUTPATIENT_CLINIC_OR_DEPARTMENT_OTHER): Payer: BC Managed Care – PPO

## 2014-01-03 ENCOUNTER — Encounter (HOSPITAL_BASED_OUTPATIENT_CLINIC_OR_DEPARTMENT_OTHER): Payer: Self-pay | Admitting: Emergency Medicine

## 2014-01-03 ENCOUNTER — Emergency Department (HOSPITAL_BASED_OUTPATIENT_CLINIC_OR_DEPARTMENT_OTHER)
Admission: EM | Admit: 2014-01-03 | Discharge: 2014-01-03 | Disposition: A | Discharge status: Home / Self Care | Payer: BC Managed Care – PPO | Attending: Emergency Medicine | Admitting: Emergency Medicine

## 2014-01-03 DIAGNOSIS — Z79899 Other long term (current) drug therapy: Secondary | ICD-10-CM | POA: Insufficient documentation

## 2014-01-03 DIAGNOSIS — Z8619 Personal history of other infectious and parasitic diseases: Secondary | ICD-10-CM | POA: Insufficient documentation

## 2014-01-03 DIAGNOSIS — S139XXA Sprain of joints and ligaments of unspecified parts of neck, initial encounter: Secondary | ICD-10-CM | POA: Insufficient documentation

## 2014-01-03 DIAGNOSIS — F411 Generalized anxiety disorder: Secondary | ICD-10-CM | POA: Insufficient documentation

## 2014-01-03 DIAGNOSIS — S161XXA Strain of muscle, fascia and tendon at neck level, initial encounter: Secondary | ICD-10-CM

## 2014-01-03 DIAGNOSIS — R110 Nausea: Secondary | ICD-10-CM | POA: Insufficient documentation

## 2014-01-03 DIAGNOSIS — F3289 Other specified depressive episodes: Secondary | ICD-10-CM | POA: Insufficient documentation

## 2014-01-03 DIAGNOSIS — F17200 Nicotine dependence, unspecified, uncomplicated: Secondary | ICD-10-CM | POA: Insufficient documentation

## 2014-01-03 DIAGNOSIS — Y9389 Activity, other specified: Secondary | ICD-10-CM | POA: Insufficient documentation

## 2014-01-03 DIAGNOSIS — F329 Major depressive disorder, single episode, unspecified: Secondary | ICD-10-CM | POA: Insufficient documentation

## 2014-01-03 DIAGNOSIS — Y92410 Unspecified street and highway as the place of occurrence of the external cause: Secondary | ICD-10-CM | POA: Insufficient documentation

## 2014-01-03 DIAGNOSIS — K219 Gastro-esophageal reflux disease without esophagitis: Secondary | ICD-10-CM | POA: Insufficient documentation

## 2014-01-03 DIAGNOSIS — IMO0002 Reserved for concepts with insufficient information to code with codable children: Secondary | ICD-10-CM | POA: Insufficient documentation

## 2014-01-03 MED ORDER — OXYCODONE-ACETAMINOPHEN 5-325 MG PO TABS: 1.0000 | ORAL_TABLET | ORAL | Status: DC | PRN

## 2014-01-03 MED ORDER — ONDANSETRON 8 MG PO TBDP
8.0000 mg | ORAL_TABLET | Freq: Once | ORAL | Status: AC
  Administered 2014-01-03: 8 mg via ORAL
  Filled 2014-01-03: qty 1

## 2014-01-03 MED ORDER — OXYCODONE-ACETAMINOPHEN 5-325 MG PO TABS
1.0000 | ORAL_TABLET | Freq: Once | ORAL | Status: AC
  Administered 2014-01-03: 1 via ORAL
  Filled 2014-01-03: qty 1
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
## 2014-01-03 NOTE — Discharge Instructions (Signed)
You have neck pain, possibly from a cervical strain and/or pinched nerve.   SEEK IMMEDIATE MEDICAL ATTENTION IF: You develop difficulties swallowing or breathing.  You have new or worse numbness, weakness, tingling, or movement problems in your arms or legs.  You develop increasing pain which is uncontrolled with medications.  You have change in bowel or bladder function, or other concerns.

## 2014-02-01 ENCOUNTER — Emergency Department (HOSPITAL_BASED_OUTPATIENT_CLINIC_OR_DEPARTMENT_OTHER)
Admission: EM | Admit: 2014-02-01 | Discharge: 2014-02-01 | Disposition: A | Discharge status: Home / Self Care | Payer: BC Managed Care – PPO | Attending: Emergency Medicine | Admitting: Emergency Medicine

## 2014-02-01 ENCOUNTER — Emergency Department (HOSPITAL_BASED_OUTPATIENT_CLINIC_OR_DEPARTMENT_OTHER): Payer: BC Managed Care – PPO

## 2014-02-01 ENCOUNTER — Encounter (HOSPITAL_BASED_OUTPATIENT_CLINIC_OR_DEPARTMENT_OTHER): Payer: Self-pay | Admitting: Emergency Medicine

## 2014-02-01 DIAGNOSIS — IMO0001 Reserved for inherently not codable concepts without codable children: Secondary | ICD-10-CM | POA: Insufficient documentation

## 2014-02-01 DIAGNOSIS — F17200 Nicotine dependence, unspecified, uncomplicated: Secondary | ICD-10-CM | POA: Insufficient documentation

## 2014-02-01 DIAGNOSIS — F411 Generalized anxiety disorder: Secondary | ICD-10-CM | POA: Insufficient documentation

## 2014-02-01 DIAGNOSIS — F0781 Postconcussional syndrome: Secondary | ICD-10-CM | POA: Insufficient documentation

## 2014-02-01 DIAGNOSIS — IMO0002 Reserved for concepts with insufficient information to code with codable children: Secondary | ICD-10-CM | POA: Insufficient documentation

## 2014-02-01 DIAGNOSIS — Z8619 Personal history of other infectious and parasitic diseases: Secondary | ICD-10-CM | POA: Insufficient documentation

## 2014-02-01 DIAGNOSIS — K219 Gastro-esophageal reflux disease without esophagitis: Secondary | ICD-10-CM | POA: Insufficient documentation

## 2014-02-01 DIAGNOSIS — F329 Major depressive disorder, single episode, unspecified: Secondary | ICD-10-CM | POA: Insufficient documentation

## 2014-02-01 DIAGNOSIS — Z79899 Other long term (current) drug therapy: Secondary | ICD-10-CM | POA: Insufficient documentation

## 2014-02-01 DIAGNOSIS — R110 Nausea: Secondary | ICD-10-CM | POA: Insufficient documentation

## 2014-02-01 DIAGNOSIS — F3289 Other specified depressive episodes: Secondary | ICD-10-CM | POA: Insufficient documentation

## 2014-02-01 MED ORDER — METOCLOPRAMIDE HCL 5 MG/ML IJ SOLN
10.0000 mg | Freq: Once | INTRAMUSCULAR | Status: AC
  Administered 2014-02-01: 10 mg via INTRAMUSCULAR
  Filled 2014-02-01: qty 2

## 2014-02-01 MED ORDER — DIPHENHYDRAMINE HCL 50 MG/ML IJ SOLN
50.0000 mg | Freq: Once | INTRAMUSCULAR | Status: AC
  Administered 2014-02-01: 50 mg via INTRAMUSCULAR
  Filled 2014-02-01: qty 1

## 2014-02-01 MED ORDER — MORPHINE SULFATE 4 MG/ML IJ SOLN
4.0000 mg | Freq: Once | INTRAMUSCULAR | Status: AC
  Administered 2014-02-01: 4 mg via INTRAMUSCULAR
  Filled 2014-02-01: qty 1
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
## 2014-02-01 NOTE — Discharge Instructions (Signed)
Concussion, Adult A concussion, or closed-head injury, is a brain injury caused by a direct blow to the head or by a quick and sudden movement (jolt) of the head or neck. Concussions are usually not life-threatening. Even so, the effects of a concussion can be serious. If you have had a concussion before, you are more likely to experience concussion-like symptoms after a direct blow to the head.  CAUSES   Direct blow to the head, such as from running into another player during a soccer game, being hit in a fight, or hitting your head on a hard surface.  A jolt of the head or neck that causes the brain to move back and forth inside the skull, such as in a car crash. SIGNS AND SYMPTOMS  The signs of a concussion can be hard to notice. Early on, they may be missed by you, family members, and health care providers. You may look fine but act or feel differently. Symptoms are usually temporary, but they may last for days, weeks, or even longer. Some symptoms may appear right away while others may not show up for hours or days. Every head injury is different. Symptoms include:   Mild to moderate headaches that will not go away.  A feeling of pressure inside your head.  Having more trouble than usual:   Learning or remembering things you have heard.  Answering questions.  Paying attention or concentrating.   Organizing daily tasks.   Making decisions and solving problems.   Slowness in thinking, acting or reacting, speaking, or reading.   Getting lost or being easily confused.   Feeling tired all the time or lacking energy (fatigued).   Feeling drowsy.   Sleep disturbances.   Sleeping more than usual.   Sleeping less than usual.   Trouble falling asleep.   Trouble sleeping (insomnia).   Loss of balance or feeling lightheaded or dizzy.   Nausea or vomiting.   Numbness or tingling.   Increased sensitivity to:   Sounds.   Lights.   Distractions.    Vision problems or eyes that tire easily.   Diminished sense of taste or smell.   Ringing in the ears.   Mood changes such as feeling sad or anxious.   Becoming easily irritated or angry for little or no reason.   Lack of motivation.  Seeing or hearing things other people do not see or hear (hallucinations). DIAGNOSIS  Your health care provider can usually diagnose a concussion based on a description of your injury and symptoms. He or she will ask whether you passed out (lost consciousness) and whether you are having trouble remembering events that happened right before and during your injury.  Your evaluation might include:   A brain scan to look for signs of injury to the brain. Even if the test shows no injury, you may still have a concussion.   Blood tests to be sure other problems are not present. TREATMENT   Concussions are usually treated in an emergency department, in urgent care, or at a clinic. You may need to stay in the hospital overnight for further treatment.   Tell your health care provider if you are taking any medicines, including prescription medicines, over-the-counter medicines, and natural remedies. Some medicines, such as blood thinners (anticoagulants) and aspirin, may increase the chance of complications. Also tell your health care provider whether you have had alcohol or are taking illegal drugs. This information may affect treatment.  Your health care provider will send you   home with important instructions to follow.  How fast you will recover from a concussion depends on many factors. These factors include how severe your concussion is, what part of your brain was injured, your age, and how healthy you were before the concussion.  Most people with mild injuries recover fully. Recovery can take time. In general, recovery is slower in older persons. Also, persons who have had a concussion in the past or have other medical problems may find that it  takes longer to recover from their current injury. HOME CARE INSTRUCTIONS  General Instructions  Carefully follow the directions your health care provider gave you.  Only take over-the-counter or prescription medicines for pain, discomfort, or fever as directed by your health care provider.  Take only those medicines that your health care provider has approved.  Do not drink alcohol until your health care provider says you are well enough to do so. Alcohol and certain other drugs may slow your recovery and can put you at risk of further injury.  If it is harder than usual to remember things, write them down.  If you are easily distracted, try to do one thing at a time. For example, do not try to watch TV while fixing dinner.  Talk with family members or close friends when making important decisions.  Keep all follow-up appointments. Repeated evaluation of your symptoms is recommended for your recovery.  Watch your symptoms and tell others to do the same. Complications sometimes occur after a concussion. Older adults with a brain injury may have a higher risk of serious complications such as of a blood clot on the brain.  Tell your teachers, school nurse, school counselor, coach, athletic trainer, or work manager about your injury, symptoms, and restrictions. Tell them about what you can or cannot do. They should watch for:   Increased problems with attention or concentration.   Increased difficulty remembering or learning new information.   Increased time needed to complete tasks or assignments.   Increased irritability or decreased ability to cope with stress.   Increased symptoms.   Rest. Rest helps the brain to heal. Make sure you:  Get plenty of sleep at night. Avoid staying up late at night.  Keep the same bedtime hours on weekends and weekdays.  Rest during the day. Take daytime naps or rest breaks when you feel tired.  Limit activities that require a lot of  thought or concentration. These includes   Doing homework or job-related work.   Watching TV.   Working on the computer.  Avoid any situation where there is potential for another head injury (football, hockey, soccer, basketball, martial arts, downhill snow sports and horseback riding). Your condition will get worse every time you experience a concussion. You should avoid these activities until you are evaluated by the appropriate follow-up caregivers. Returning To Your Regular Activities You will need to return to your normal activities slowly, not all at once. You must give your body and brain enough time for recovery.  Do not return to sports or other athletic activities until your health care provider tells you it is safe to do so.  Ask your health care provider when you can drive, ride a bicycle, or operate heavy machinery. Your ability to react may be slower after a brain injury. Never do these activities if you are dizzy.  Ask your health care provider about when you can return to work or school. Preventing Another Concussion It is very important to avoid another   brain injury, especially before you have recovered. In rare cases, another injury can lead to permanent brain damage, brain swelling, or death. The risk of this is greatest during the first 7 10 days after a head injury. Avoid injuries by:   Wearing a seat belt when riding in a car.   Drinking alcohol only in moderation.   Wearing a helmet when biking, skiing, skateboarding, skating, or doing similar activities.  Avoiding activities that could lead to a second concussion, such as contact or recreational sports, until your health care provider says it is OK.  Taking safety measures in your home.   Remove clutter and tripping hazards from floors and stairways.   Use grab bars in bathrooms and handrails by stairs.   Place non-slip mats on floors and in bathtubs.   Improve lighting in dim areas. SEEK MEDICAL  CARE IF:   You have increased problems paying attention or concentrating.   You have increased difficulty remembering or learning new information.   You need more time to complete tasks or assignments than before.   You have increased irritability or decreased ability to cope with stress.  You have more symptoms than before. Seek medical care if you have any of the following symptoms for more than 2 weeks after your injury:   Lasting (chronic) headaches.   Dizziness or balance problems.   Nausea.  Vision problems.   Increased sensitivity to noise or light.   Depression or mood swings.   Anxiety or irritability.   Memory problems.   Difficulty concentrating or paying attention.   Sleep problems.   Feeling tired all the time. SEEK IMMEDIATE MEDICAL CARE IF:   You have severe or worsening headaches. These may be a sign of a blood clot in the brain.  You have weakness (even if only in one hand, leg, or part of the face).  You have numbness.  You have decreased coordination.   You vomit repeatedly.  You have increased sleepiness.  One pupil is larger than the other.   You have convulsions.   You have slurred speech.   You have increased confusion. This may be a sign of a blood clot in the brain.  You have increased restlessness, agitation, or irritability.   You are unable to recognize people or places.   You have neck pain.   It is difficult to wake you up.   You have unusual behavior changes.   You lose consciousness. MAKE SURE YOU:   Understand these instructions.  Will watch your condition.  Will get help right away if you are not doing well or get worse. Document Released: 11/06/2003 Document Revised: 04/18/2013 Document Reviewed: 03/08/2013 [employer] Patient Information 2014 [employer]  
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
## 2014-02-06 IMAGING — CR DG ABDOMEN ACUTE W/ 1V CHEST
4 series · 4 of 4 positions shown · non-contrast
Comparison: CT 08/07/2011

CLINICAL DATA: Right flank pain, nausea

ACUTE ABDOMEN SERIES (ABDOMEN 2 VIEW & CHEST 1 VIEW)

[t abdomen supine (1 of 2)]
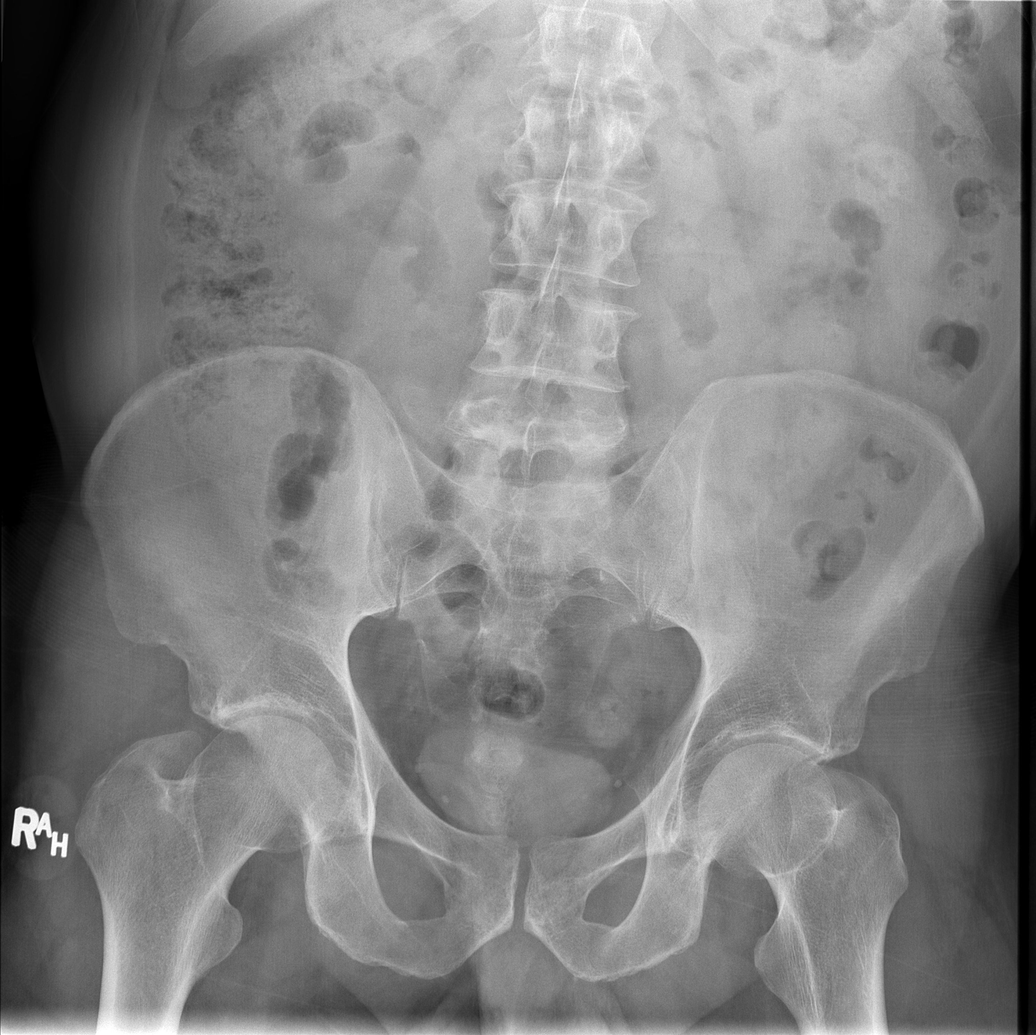

[t abdomen supine (2 of 2)]
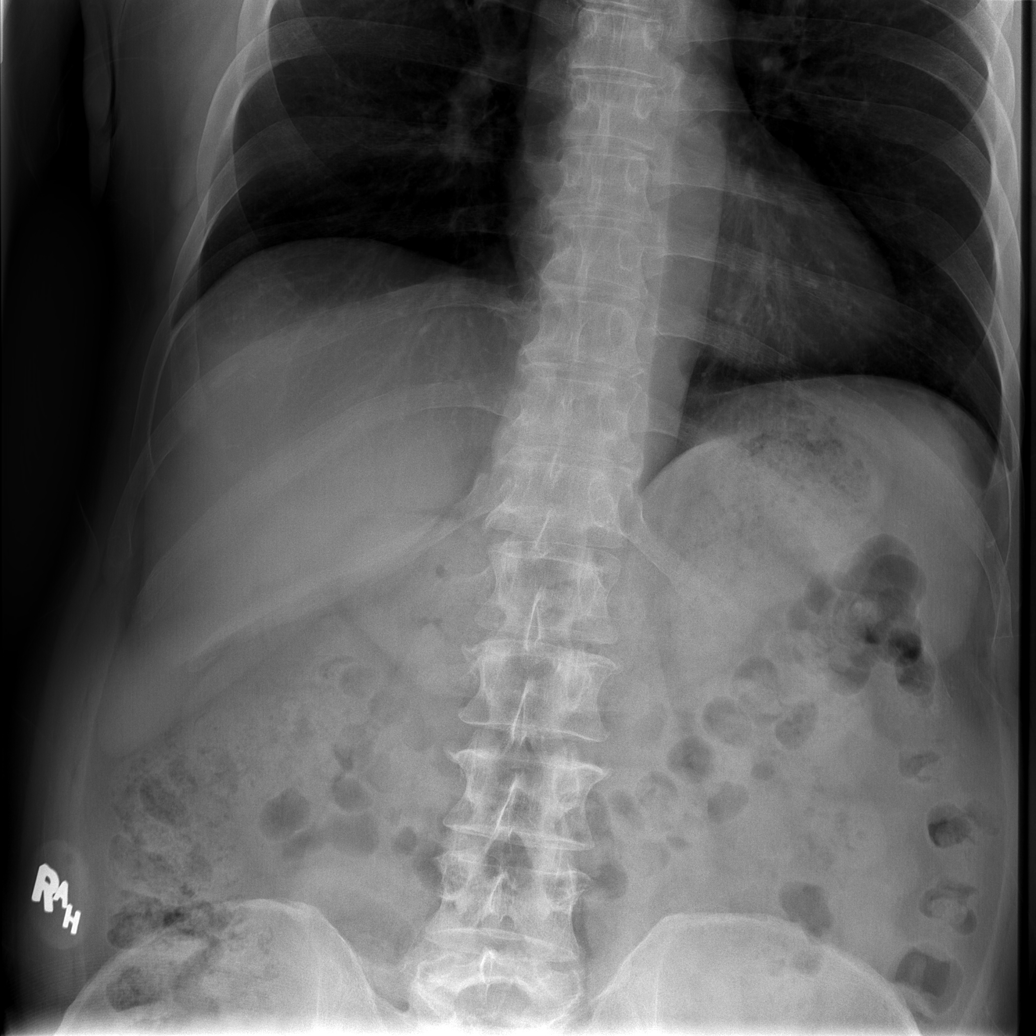

[w chest pa]
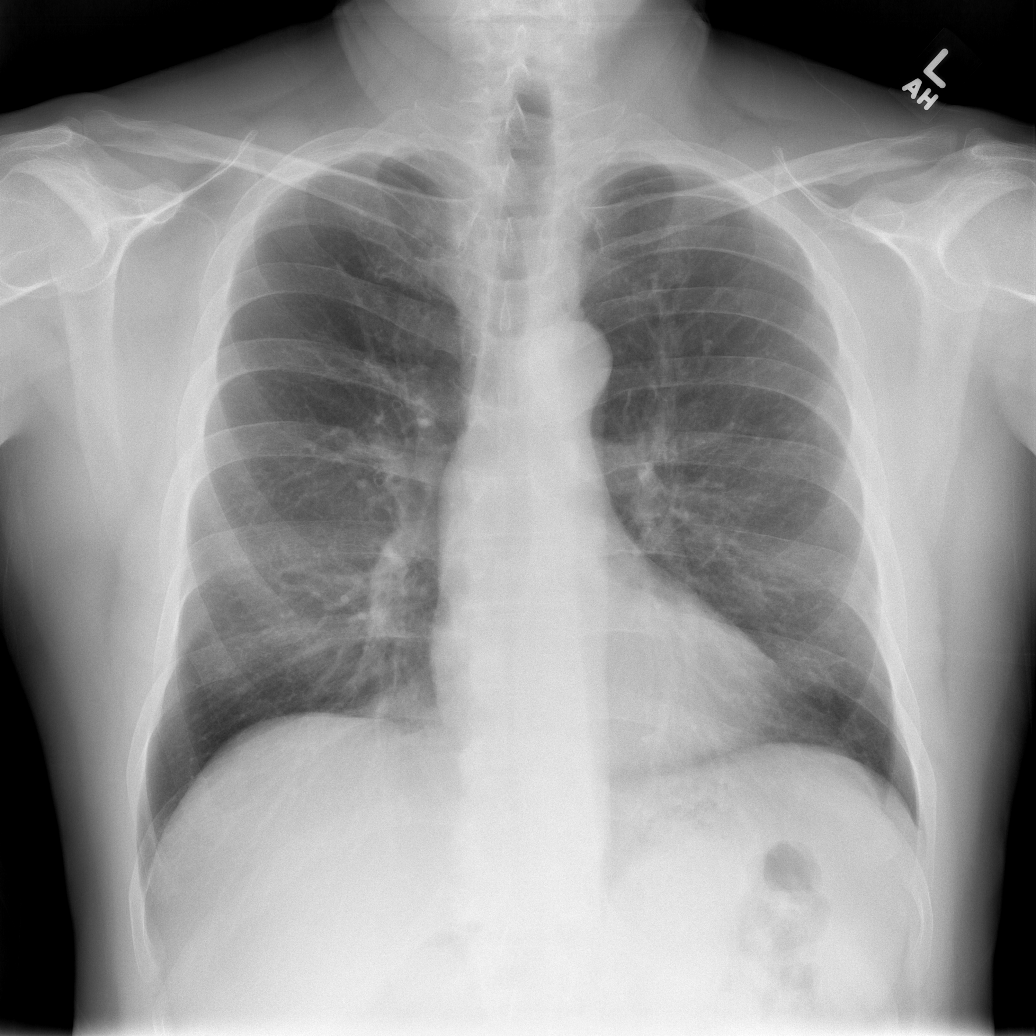

[w abdomen upright]
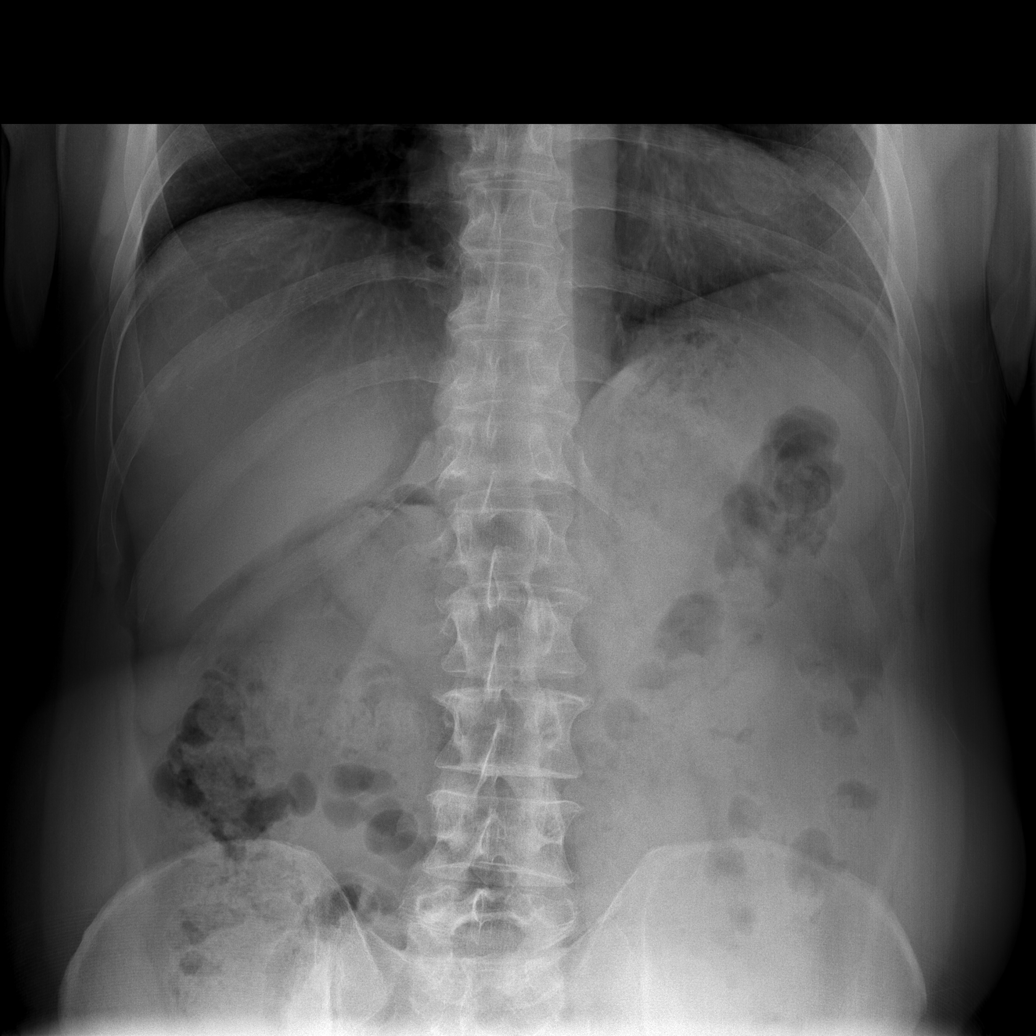

[4 of 4 positions shown; findings below may reference images not displayed]

FINDINGS: Frontal chest radiograph is clear.  No free air.  Small
bowel decompressed.  Moderate fecal material in the proximal colon,
decompressed distally.  Bilateral pelvic vascular calcifications.
Regional bones unremarkable.
IMPRESSION: 1.  Nonobstructive bowel gas pattern with moderate proximal colonic
fecal material.
2.  No free air.
3.  No acute cardiopulmonary disease.

## 2014-03-12 ENCOUNTER — Other Ambulatory Visit: Payer: Self-pay | Admitting: Gastroenterology

## 2014-03-12 DIAGNOSIS — B1920 Unspecified viral hepatitis C without hepatic coma: Secondary | ICD-10-CM

## 2014-03-21 ENCOUNTER — Other Ambulatory Visit: Payer: BC Managed Care – PPO

## 2014-04-08 ENCOUNTER — Other Ambulatory Visit: Payer: BC Managed Care – PPO

## 2014-04-09 ENCOUNTER — Other Ambulatory Visit: Payer: BC Managed Care – PPO

## 2014-04-11 ENCOUNTER — Ambulatory Visit
Admission: RE | Admit: 2014-04-11 | Discharge: 2014-04-11 | Disposition: A | Discharge status: Home / Self Care | Payer: BC Managed Care – PPO | Source: Ambulatory Visit | Attending: Gastroenterology | Admitting: Gastroenterology

## 2014-04-11 DIAGNOSIS — B1920 Unspecified viral hepatitis C without hepatic coma: Secondary | ICD-10-CM

## 2014-04-16 ENCOUNTER — Ambulatory Visit (INDEPENDENT_AMBULATORY_CARE_PROVIDER_SITE_OTHER): Payer: BC Managed Care – PPO | Admitting: Diagnostic Neuroimaging

## 2014-04-16 ENCOUNTER — Telehealth: Payer: Self-pay | Admitting: Diagnostic Neuroimaging

## 2014-04-16 ENCOUNTER — Encounter: Payer: Self-pay | Admitting: Diagnostic Neuroimaging

## 2014-04-16 VITALS — BP 136/84 | HR 85 | Temp 98.6°F | Ht 68.5 in | Wt 199.0 lb

## 2014-04-16 DIAGNOSIS — G44329 Chronic post-traumatic headache, not intractable: Secondary | ICD-10-CM

## 2014-04-16 DIAGNOSIS — G44321 Chronic post-traumatic headache, intractable: Secondary | ICD-10-CM

## 2014-04-16 MED ORDER — AMITRIPTYLINE HCL 50 MG PO TABS: 50.0000 mg | ORAL_TABLET | Freq: Every day | ORAL | Status: DC

## 2014-04-16 MED ORDER — AMITRIPTYLINE HCL 50 MG PO TABS: 25.0000 mg | ORAL_TABLET | Freq: Every day | ORAL | Status: DC
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
## 2014-04-16 NOTE — Telephone Encounter (Signed)
I called back.  Spoke with [name].  Told her we had received 2 calls regarding this patient, so we were trying to clarify whatever info they needed to ensure he's taken care of.  She said, First of all, I only called you once, the patient is the one that keeps calling.  Then said we sent multiple Rx's for Amitriptyline.  OV note only shows we sent one Rx for 50mg  tabs at bedtime.  She again said they received multiple Rx's, and added if they only recievd one Rx, she would have never had to call us to begin with.  I apologized and asked that they disregard any other Rx's they may have gotten today.  She took my name and said they will fill Rx for 50mg  at bedtime.

## 2014-04-16 NOTE — Telephone Encounter (Signed)
Patient's at Pharmacy now to pick Rx and requesting this matter be handled asap.  Thanks

## 2014-04-16 NOTE — Patient Instructions (Signed)
Post-Concussion Syndrome Post-concussion syndrome describes the symptoms that can occur after a head injury. These symptoms can last from weeks to months. CAUSES  It is not clear why some head injuries cause post-concussion syndrome. It can occur whether your head injury was mild or severe and whether you were wearing head protection or not.  SIGNS AND SYMPTOMS  Memory difficulties.  Dizziness.  Headaches.  Double vision or blurry vision.  Sensitivity to light.  Hearing difficulties.  Depression.  Tiredness.  Weakness.  Difficulty with concentration.  Difficulty sleeping or staying asleep.  Vomiting.  Poor balance or instability on your feet.  Slow reaction time.  Difficulty learning and remembering things you have heard. DIAGNOSIS  There is no test to determine whether you have post-concussion syndrome. Your health care provider may order an imaging scan of your brain, such as a CT scan, to check for other problems that may be causing your symptoms (such as severe injury inside your skull). TREATMENT  Usually, these problems disappear over time without medical care. Your health care provider may prescribe medicine to help ease your symptoms. It is important to follow up with a neurologist to evaluate your recovery and address any lingering symptoms or issues. HOME CARE INSTRUCTIONS   Only take over-the-counter or prescription medicines for pain, discomfort, or fever as directed by your health care provider. Do not take aspirin. Aspirin can slow blood clotting.  Sleep with your head slightly elevated to help with headaches.  Avoid any situation where there is potential for another head injury (football, hockey, soccer, basketball, martial arts, downhill snow sports, and horseback riding). Your condition will get worse every time you experience a concussion. You should avoid these activities until you are evaluated by the appropriate follow-up health care  providers.  Keep all follow-up appointments as directed by your health care provider. SEEK IMMEDIATE MEDICAL CARE IF:  You develop confusion or unusual drowsiness.  You cannot wake the injured person.  You develop nausea or persistent, forceful vomiting.  You feel like you are moving when you are not (vertigo).  You notice the injured person's eyes moving rapidly back and forth. This may be a sign of vertigo.  You have convulsions or faint.  You have severe, persistent headaches that are not relieved by medicine.  You cannot use your arms or legs normally.  Your pupils change size.  You have clear or bloody discharge from the nose or ears.  Your problems are getting worse, not better. MAKE SURE YOU:  Understand these instructions.  Will watch your condition.  Will get help right away if you are not doing well or get worse. Document Released: 02/05/2002 Document Revised: 06/06/2013 Document Reviewed: 11/21/2013 [facility] Patient Information 2015 [place], [place]. This information is not intended to replace advice given to you by your health care provider. Make sure you discuss any questions you have with your health care provider.

## 2014-04-16 NOTE — Telephone Encounter (Signed)
Baxter Flattery from [employer] calling to get clarification on patient's e-scripts that were sent today, please return call and advise.

## 2014-04-19 ENCOUNTER — Telehealth: Payer: Self-pay | Admitting: Neurology

## 2014-04-19 ENCOUNTER — Telehealth: Payer: Self-pay | Admitting: Diagnostic Neuroimaging

## 2014-04-19 MED ORDER — TOPIRAMATE 25 MG PO TABS: ORAL_TABLET | ORAL | Status: DC

## 2014-04-19 MED ORDER — AMITRIPTYLINE HCL 25 MG PO TABS: 25.0000 mg | ORAL_TABLET | Freq: Every day | ORAL | Status: DC

## 2014-04-19 NOTE — Telephone Encounter (Addendum)
Called patient explained spoke w/ Dr. Rexene Alberts and she recommended to continue taking med if he no other side effects present. She advised the med takes up to at least 14 days.  Fwd call to Dr. Jannifer [facility]) because patient had additional questions.

## 2014-04-19 NOTE — Telephone Encounter (Signed)
Patient calling to state that for the past 2 nights he has been waking up with headaches and he believes the Elavil is making his headache worse, please return call to patient and advise.

## 2014-04-19 NOTE — Telephone Encounter (Signed)
Patient states he started Elavil 50mg  tablet on 04/16/14. The first night he took the med he felt a little "foggy" when he woke up, but began to feel better throughout the day, had only mild HA's. The next night (04/17/14) he woke up during the night with a HA. The night after that (04/18/14) a pounding HA woke him up out of his sleep. He is requesting to know if he should continue med, or decrease dose.

## 2014-04-19 NOTE — Telephone Encounter (Signed)
I called patient. The patient has had worsening headaches on the increased dose of amitriptyline. I will cut him back to 25 mg at night, and start Topamax for the headache 25 mg at night for a week, then take 50 mg at night. The patient can be switched over to Topamax and tapered off of amitriptyline if he is able to do well on this new medication.

## 2014-04-19 NOTE — Telephone Encounter (Signed)
I talked with the patient, please refer to telephone note from today.

## 2014-05-13 ENCOUNTER — Encounter (HOSPITAL_COMMUNITY): Payer: Self-pay | Admitting: Emergency Medicine

## 2014-05-13 ENCOUNTER — Emergency Department (HOSPITAL_COMMUNITY): Payer: BC Managed Care – PPO

## 2014-05-13 ENCOUNTER — Emergency Department (HOSPITAL_COMMUNITY)
Admission: EM | Admit: 2014-05-13 | Discharge: 2014-05-13 | Disposition: A | Discharge status: Home / Self Care | Payer: BC Managed Care – PPO | Attending: Emergency Medicine | Admitting: Emergency Medicine

## 2014-05-13 DIAGNOSIS — F411 Generalized anxiety disorder: Secondary | ICD-10-CM | POA: Diagnosis not present

## 2014-05-13 DIAGNOSIS — R1084 Generalized abdominal pain: Secondary | ICD-10-CM | POA: Diagnosis not present

## 2014-05-13 DIAGNOSIS — R079 Chest pain, unspecified: Secondary | ICD-10-CM | POA: Insufficient documentation

## 2014-05-13 DIAGNOSIS — R110 Nausea: Secondary | ICD-10-CM | POA: Diagnosis not present

## 2014-05-13 DIAGNOSIS — Z79899 Other long term (current) drug therapy: Secondary | ICD-10-CM | POA: Diagnosis not present

## 2014-05-13 DIAGNOSIS — Z8619 Personal history of other infectious and parasitic diseases: Secondary | ICD-10-CM | POA: Insufficient documentation

## 2014-05-13 DIAGNOSIS — F17200 Nicotine dependence, unspecified, uncomplicated: Secondary | ICD-10-CM | POA: Diagnosis not present

## 2014-05-13 DIAGNOSIS — F329 Major depressive disorder, single episode, unspecified: Secondary | ICD-10-CM | POA: Insufficient documentation

## 2014-05-13 DIAGNOSIS — F3289 Other specified depressive episodes: Secondary | ICD-10-CM | POA: Diagnosis not present

## 2014-05-13 DIAGNOSIS — IMO0001 Reserved for inherently not codable concepts without codable children: Secondary | ICD-10-CM | POA: Diagnosis not present

## 2014-05-13 DIAGNOSIS — R0602 Shortness of breath: Secondary | ICD-10-CM | POA: Diagnosis not present

## 2014-05-13 DIAGNOSIS — Z8719 Personal history of other diseases of the digestive system: Secondary | ICD-10-CM | POA: Diagnosis not present

## 2014-05-13 DIAGNOSIS — R0789 Other chest pain: Secondary | ICD-10-CM | POA: Diagnosis not present

## 2014-05-13 LAB — CBC WITH DIFFERENTIAL/PLATELET
Basophils Absolute: 0 10*3/uL (ref 0.0–0.1)
Basophils Relative: 0 % (ref 0–1)
Eosinophils Absolute: 0.3 10*3/uL (ref 0.0–0.7)
Eosinophils Relative: 4 % (ref 0–5)
HCT: 41.9 % (ref 39.0–52.0)
Hemoglobin: 14.3 g/dL (ref 13.0–17.0)
Lymphocytes Relative: 29 % (ref 12–46)
Lymphs Abs: 2.4 10*3/uL (ref 0.7–4.0)
MCH: 31 pg (ref 26.0–34.0)
MCHC: 34.1 g/dL (ref 30.0–36.0)
MCV: 90.7 fL (ref 78.0–100.0)
Monocytes Absolute: 0.6 10*3/uL (ref 0.1–1.0)
Monocytes Relative: 8 % (ref 3–12)
Neutro Abs: 4.8 10*3/uL (ref 1.7–7.7)
Neutrophils Relative %: 59 % (ref 43–77)
Platelets: 205 10*3/uL (ref 150–400)
RBC: 4.62 MIL/uL (ref 4.22–5.81)
RDW: 12.8 % (ref 11.5–15.5)
WBC: 8.2 10*3/uL (ref 4.0–10.5)

## 2014-05-13 LAB — COMPREHENSIVE METABOLIC PANEL
ALT: 21 U/L (ref 0–53)
AST: 19 U/L (ref 0–37)
Albumin: 3.7 g/dL (ref 3.5–5.2)
Alkaline Phosphatase: 56 U/L (ref 39–117)
Anion gap: 12 (ref 5–15)
BUN: 16 mg/dL (ref 6–23)
CO2: 25 mEq/L (ref 19–32)
Calcium: 9.4 mg/dL (ref 8.4–10.5)
Chloride: 103 mEq/L (ref 96–112)
Creatinine, Ser: 0.88 mg/dL (ref 0.50–1.35)
GFR calc Af Amer: >90 mL/min (ref >90)
GFR calc non Af Amer: >90 mL/min (ref >90)
Glucose, Bld: 100 mg/dL — ABNORMAL HIGH (ref 70–99)
Potassium: 4.8 mEq/L (ref 3.7–5.3)
Sodium: 140 mEq/L (ref 137–147)
Total Bilirubin: 0.4 mg/dL (ref 0.3–1.2)
Total Protein: 6.7 g/dL (ref 6.0–8.3)

## 2014-05-13 LAB — TROPONIN I
Troponin I: <0.3 ng/mL (ref <0.30)
Troponin I: <0.3 ng/mL (ref <0.30)

## 2014-05-13 LAB — URINALYSIS, ROUTINE W REFLEX MICROSCOPIC
Bilirubin Urine: NEGATIVE
Glucose, UA: NEGATIVE mg/dL
Hgb urine dipstick: NEGATIVE
Ketones, ur: NEGATIVE mg/dL
Leukocytes, UA: NEGATIVE
Nitrite: NEGATIVE
Protein, ur: NEGATIVE mg/dL
Specific Gravity, Urine: 1.018 (ref 1.005–1.030)
Urobilinogen, UA: 0.2 mg/dL (ref 0.0–1.0)
pH: 6 (ref 5.0–8.0)

## 2014-05-13 LAB — LIPASE, BLOOD: Lipase: 18 U/L (ref 11–59)

## 2014-05-13 MED ORDER — GI COCKTAIL ~~LOC~~
30.0000 mL | Freq: Once | ORAL | Status: AC
  Administered 2014-05-13: 30 mL via ORAL
  Filled 2014-05-13: qty 30

## 2014-05-13 MED ORDER — ONDANSETRON HCL 4 MG/2ML IJ SOLN
4.0000 mg | Freq: Once | INTRAMUSCULAR | Status: AC
  Administered 2014-05-13: 4 mg via INTRAVENOUS
  Filled 2014-05-13: qty 2

## 2014-05-13 MED ORDER — PANTOPRAZOLE SODIUM 20 MG PO TBEC
40.0000 mg | DELAYED_RELEASE_TABLET | Freq: Every day | ORAL | Status: DC
Start: 2014-05-13 — End: 2015-03-08

## 2014-05-13 MED ORDER — SUCRALFATE 1 G PO TABS: 1.0000 g | ORAL_TABLET | Freq: Four times a day (QID) | ORAL | Status: DC

## 2014-05-13 MED ORDER — PANTOPRAZOLE SODIUM 40 MG IV SOLR
40.0000 mg | Freq: Once | INTRAVENOUS | Status: AC
  Administered 2014-05-13: 40 mg via INTRAVENOUS
  Filled 2014-05-13: qty 40

## 2014-05-13 MED ORDER — DICYCLOMINE HCL 10 MG/ML IM SOLN
20.0000 mg | Freq: Once | INTRAMUSCULAR | Status: AC
  Administered 2014-05-13: 20 mg via INTRAMUSCULAR
  Filled 2014-05-13: qty 2

## 2014-05-13 MED ORDER — MORPHINE SULFATE 4 MG/ML IJ SOLN
4.0000 mg | Freq: Once | INTRAMUSCULAR | Status: AC
  Administered 2014-05-13: 4 mg via INTRAVENOUS
  Filled 2014-05-13: qty 1

## 2014-05-13 MED ORDER — DICYCLOMINE HCL 20 MG PO TABS: 20.0000 mg | ORAL_TABLET | Freq: Four times a day (QID) | ORAL | Status: DC | PRN

## 2014-05-13 NOTE — Discharge Instructions (Signed)
Your workup here has not shown a specific cause for your symptoms.  No sign of heart damage were seen.  Please follow up with your doctor for recheck this week.  Return to the ER for worsening condition or new concerning symptoms.   Chest Pain Observation It is often hard to give a specific diagnosis for the cause of chest pain. Among other possibilities your symptoms might be caused by inadequate oxygen delivery to your heart (angina). Angina that is not treated or evaluated can lead to a heart attack (myocardial infarction) or death. Blood tests, electrocardiograms, and X-rays may have been done to help determine a possible cause of your chest pain. After evaluation and observation, your health care provider has determined that it is unlikely your pain was caused by an unstable condition that requires hospitalization. However, a full evaluation of your pain may need to be completed, with additional diagnostic testing as directed. It is very important to keep your follow-up appointments. Not keeping your follow-up appointments could result in permanent heart damage, disability, or death. If there is any problem keeping your follow-up appointments, you must call your health care provider. HOME CARE INSTRUCTIONS  Due to the slight chance that your pain could be angina, it is important to follow your health care provider's treatment plan and also maintain a healthy lifestyle:  Maintain or work toward achieving a healthy weight.  Stay physically active and exercise regularly.  Decrease your salt intake.  Eat a balanced, healthy diet. Talk to a dietitian to learn about heart-healthy foods.  Increase your fiber intake by including whole grains, vegetables, fruits, and nuts in your diet.  Avoid situations that cause stress, anger, or depression.  Take medicines as advised by your health care provider. Report any side effects to your health care provider. Do not stop medicines or adjust the dosages on  your own.  Quit smoking. Do not use nicotine patches or gum until you check with your health care provider.  Keep your blood pressure, blood sugar, and cholesterol levels within normal limits.  Limit alcohol intake to no more than 1 drink per day for women who are not pregnant and 2 drinks per day for men.  Do not abuse drugs. SEEK IMMEDIATE MEDICAL CARE IF: You have severe chest pain or pressure which may include symptoms such as:  You feel pain or pressure in your arms, neck, jaw, or back.  You have severe back or abdominal pain, feel sick to your stomach (nauseous), or throw up (vomit).  You are sweating profusely.  You are having a fast or irregular heartbeat.  You feel short of breath while at rest.  You notice increasing shortness of breath during rest, sleep, or with activity.  You have chest pain that does not get better after rest or after taking your usual medicine.  You wake from sleep with chest pain.  You are unable to sleep because you cannot breathe.  You develop a frequent cough or you are coughing up blood.  You feel dizzy, faint, or experience extreme fatigue.  You develop severe weakness, dizziness, fainting, or chills. Any of these symptoms may represent a serious problem that is an emergency. Do not wait to see if the symptoms will go away. Call your local emergency services (911 in the [place] Do not drive yourself to the hospital. MAKE SURE YOU:  Understand these instructions.  Will watch your condition.  Will get help right away if you are not doing well or get worse.  Document Released: 09/18/2010 Document Revised: 08/21/2013 Document Reviewed: 02/15/2013 [facility] Patient Information 2015 [place], [place]. This information is not intended to replace advice given to you by your health care provider. Make sure you discuss any questions you have with your health care provider.  Abdominal Pain Many things can cause belly (abdominal) pain. Most times,  the belly pain is not dangerous. Many cases of belly pain can be watched and treated at home. HOME CARE   Do not take medicines that help you go poop (laxatives) unless told to by your doctor.  Only take medicine as told by your doctor.  Eat or drink as told by your doctor. Your doctor will tell you if you should be on a special diet. GET HELP IF:  You do not know what is causing your belly pain.  You have belly pain while you are sick to your stomach (nauseous) or have runny poop (diarrhea).  You have pain while you pee or poop.  Your belly pain wakes you up at night.  You have belly pain that gets worse or better when you eat.  You have belly pain that gets worse when you eat fatty foods.  You have a fever. GET HELP RIGHT AWAY IF:   The pain does not go away within 2 hours.  You keep throwing up (vomiting).  The pain changes and is only in the right or left part of the belly.  You have bloody or tarry looking poop. MAKE SURE YOU:   Understand these instructions.  Will watch your condition.  Will get help right away if you are not doing well or get worse. Document Released: 02/02/2008 Document Revised: 08/21/2013 Document Reviewed: 04/25/2013 [facility] Patient Information 2015 [place], [place]. This information is not intended to replace advice given to you by your health care provider. Make sure you discuss any questions you have with your health care provider.

## 2014-05-13 NOTE — ED Notes (Signed)
MD at bedside. 

## 2014-05-13 NOTE — ED Notes (Signed)
Dr. Sharol Given back in with pt.

## 2014-05-13 NOTE — ED Notes (Signed)
Per EMS: pt coming from home with c/o substernal chest pain radiating to back and right upper abdomen since yesterday. pt was given nitro x 2, 324 asa, 20 g RFA, 98% RA, pt reports nausea, shortness of breath, skin warm and dry, bp 110/60, HR 70m resp 18,

## 2014-05-13 NOTE — ED Provider Notes (Signed)
CSN: [id number]     Arrival date & time 05/13/14  0455 History   First MD Initiated Contact with Patient 05/13/14 0507     Chief Complaint  Patient presents with  . Chest Pain     (Consider location/radiation/quality/duration/timing/severity/associated sxs/prior Treatment) HPI 58 year old male presents emergency department from home via EMS with complaint of chest pain.  Patient reports he's been having chest pain intermittently over the last day, starting Sunday afternoon.  Patient reports he's had a poor appetite over the last few days.  He has history of eosinophilic esophagitis.  Patient reports she took Gaviscon yesterday with minimal improvement in symptoms.  He woke up at 3 AM with worsening pain, took again without Improvement.  He complains of headache nausea and some shortness of breath.  Symptoms are similar to his prior ssophagitis, but normally He feels better after gaviscon.  Patient without history of coronary disease.  He reports normal stress test roughly about 10-15 years ago.  Patient reports family history of coronary disease, most people in their 107s or 42s when they develop symptoms.  Patient has history of hepatitis C, fibromyalgia depression and anxiety as well.  Patient reports no improvement with nitroglycerin and aspirin from EMS. Past Medical History  Diagnosis Date  . Fibromyalgia   . Neck pain   . Anxiety   . Esophageal reflux     eosinophil esophagitis  . Acute hepatitis C without mention of hepatic coma     Tx by [facility] for Hep.  negative for hep c 2/15.  . Depression    Past Surgical History  Procedure Laterality Date  . Shoulder surgery     Family History  Problem Relation Age of Onset  . Autoimmune disease Neg Hx   . Lung cancer Mother   . Heart Problems Father    History  Substance Use Topics  . Smoking status: Current Every Day Smoker -- 0.75 packs/day    Types: Cigarettes  . Smokeless tobacco: Never Used  . Alcohol Use: No    Review of  Systems   See History of Present Illness; otherwise all other systems are reviewed and negative  Allergies  Prednisone and Tetanus toxoids  Home Medications   Prior to Admission medications   Medication Sig Start Date End Date Taking? Authorizing Provider  amitriptyline (ELAVIL) 25 MG tablet Take 1 tablet (25 mg total) by mouth at bedtime. 04/19/14   Kathrynn Ducking, MD  butalbital-acetaminophen-caffeine (FIORICET, ESGIC) [phone] MG per tablet Take 1 tablet by mouth as needed. 03/25/14   Historical Provider, MD  clonazePAM (KLONOPIN) 2 MG tablet Take 2 mg by mouth 2 (two) times daily.    Historical Provider, MD  clonazePAM (KLONOPIN) 2 MG tablet Take 1 tablet by mouth as needed. 05/06/13   Historical Provider, MD  fluticasone (FLONASE) 50 MCG/ACT nasal spray Place 2 sprays into the nose daily as needed. SPRAY INTO THROAT AND SWALLOW.  DO NOT SPRAY INTO NOSE. 11/20/12   Melton Alar, PA-C  oxyCODONE-acetaminophen (PERCOCET/ROXICET) 5-325 MG per tablet Take 1 tablet by mouth every 4 (four) hours as needed for severe pain. 01/03/14   Sharyon Cable, MD  topiramate (TOPAMAX) 25 MG tablet 1 tablet at night for a week, then take 2 tablets at night 04/19/14   Kathrynn Ducking, MD  zolpidem (AMBIEN) 10 MG tablet Take 1 tablet by mouth as needed. 10/02/13   Historical Provider, MD   BP 101/66  Pulse 88  Temp(Src) 98.3 F (36.8 C) (Oral)  Resp 20  Ht 5\' 8"  (1.727 m)  Wt 195 lb (88.451 kg)  BMI 29.66 kg/m2  SpO2 96% Physical Exam  Nursing note and vitals reviewed. Constitutional: He is oriented to person, place, and time. He appears well-developed and well-nourished. He appears distressed (uncomfortable appearing.  Pain appears to come in waves.).  HENT:  Head: Normocephalic and atraumatic.  Nose: Nose normal.  Mouth/Throat: Oropharynx is clear and moist.  Eyes: Conjunctivae and EOM are normal. Pupils are equal, round, and reactive to light.  Neck: Normal range of motion. Neck supple. No JVD  present. No tracheal deviation present. No thyromegaly present.  Cardiovascular: Normal rate, regular rhythm, normal heart sounds and intact distal pulses.  Exam reveals no gallop and no friction rub.   No murmur heard. Pulmonary/Chest: Effort normal and breath sounds normal. No stridor. No respiratory distress. He has no wheezes. He has no rales. He exhibits no tenderness.  Abdominal: Soft. Bowel sounds are normal. He exhibits no distension and no mass. There is no tenderness. There is no rebound and no guarding.  Musculoskeletal: Normal range of motion. He exhibits no edema and no tenderness.  Lymphadenopathy:    He has no cervical adenopathy.  Neurological: He is alert and oriented to person, place, and time. He displays normal reflexes. He exhibits normal muscle tone. Coordination normal.  Skin: Skin is warm and dry. No rash noted. No erythema. No pallor.  Psychiatric: He has a normal mood and affect. His behavior is normal. Judgment and thought content normal.    ED Course  Procedures (including critical care time) Labs Review Labs Reviewed  COMPREHENSIVE METABOLIC PANEL - Abnormal; Notable for the following:    Glucose, Bld 100 (*)    All other components within normal limits  TROPONIN I  CBC WITH DIFFERENTIAL  LIPASE, BLOOD  URINALYSIS, ROUTINE W REFLEX MICROSCOPIC  TROPONIN I    Imaging Review Dg Chest 2 View  05/13/2014   CLINICAL DATA:  Chest pain.  EXAM: CHEST  2 VIEW  COMPARISON:  Chest radiograph performed 12/26/2013  FINDINGS: The lungs are well-aerated and clear. There is no evidence of focal opacification, pleural effusion or pneumothorax.  The heart is normal in size; the mediastinal contour is within normal limits. No acute osseous abnormalities are seen.  IMPRESSION: No acute cardiopulmonary process seen.   Electronically Signed   By: Garald Balding M.D.   On: 05/13/2014 06:30     EKG Interpretation   Date/Time:  Monday May 13 2014 05:01:19 EDT Ventricular  Rate:  78 PR Interval:  127 QRS Duration: 94 QT Interval:  360 QTC Calculation: 410 R Axis:   79 Text Interpretation:  Sinus rhythm Atrial premature complex PAC new from  prior Confirmed by Tahjai Schetter  MD, Christoffer Currier ([id number]) on 05/13/2014 5:58:09 AM      MDM   Final diagnoses:  Atypical chest pain  Generalized abdominal pain    58 year old male with chest pain, history of eosinophilic esophagitis.  EKG without ischemia.  He does have a PAC otherwise normal.  Plan for labs, chest x-ray.  We'll give GI cocktail and Protonix.  7:14 AM Patient reports chest pain is gone, moved into his right upper quadrant and down into his hip.  He reports this pain has been ongoing off and on for some time.  Patient with negative troponin, negative chest x-ray.  Patient reports that he has been cured of his hepatitis C.  Plan for [facility]A to check for kidney stone in her and give  Bentyl.  I doubt that his chest pain abdominal pain is of cardiac nature.  Abd exam is normal.    Kalman Drape, MD 05/13/14 [phone]

## 2014-05-26 IMAGING — CR DG CHEST 2V
2 series · 2 of 2 positions shown · non-contrast
Comparison: Chest radiograph performed 07/12/2010

CLINICAL DATA: Mid chest pain.

CHEST - 2 VIEW

[w chest pa]
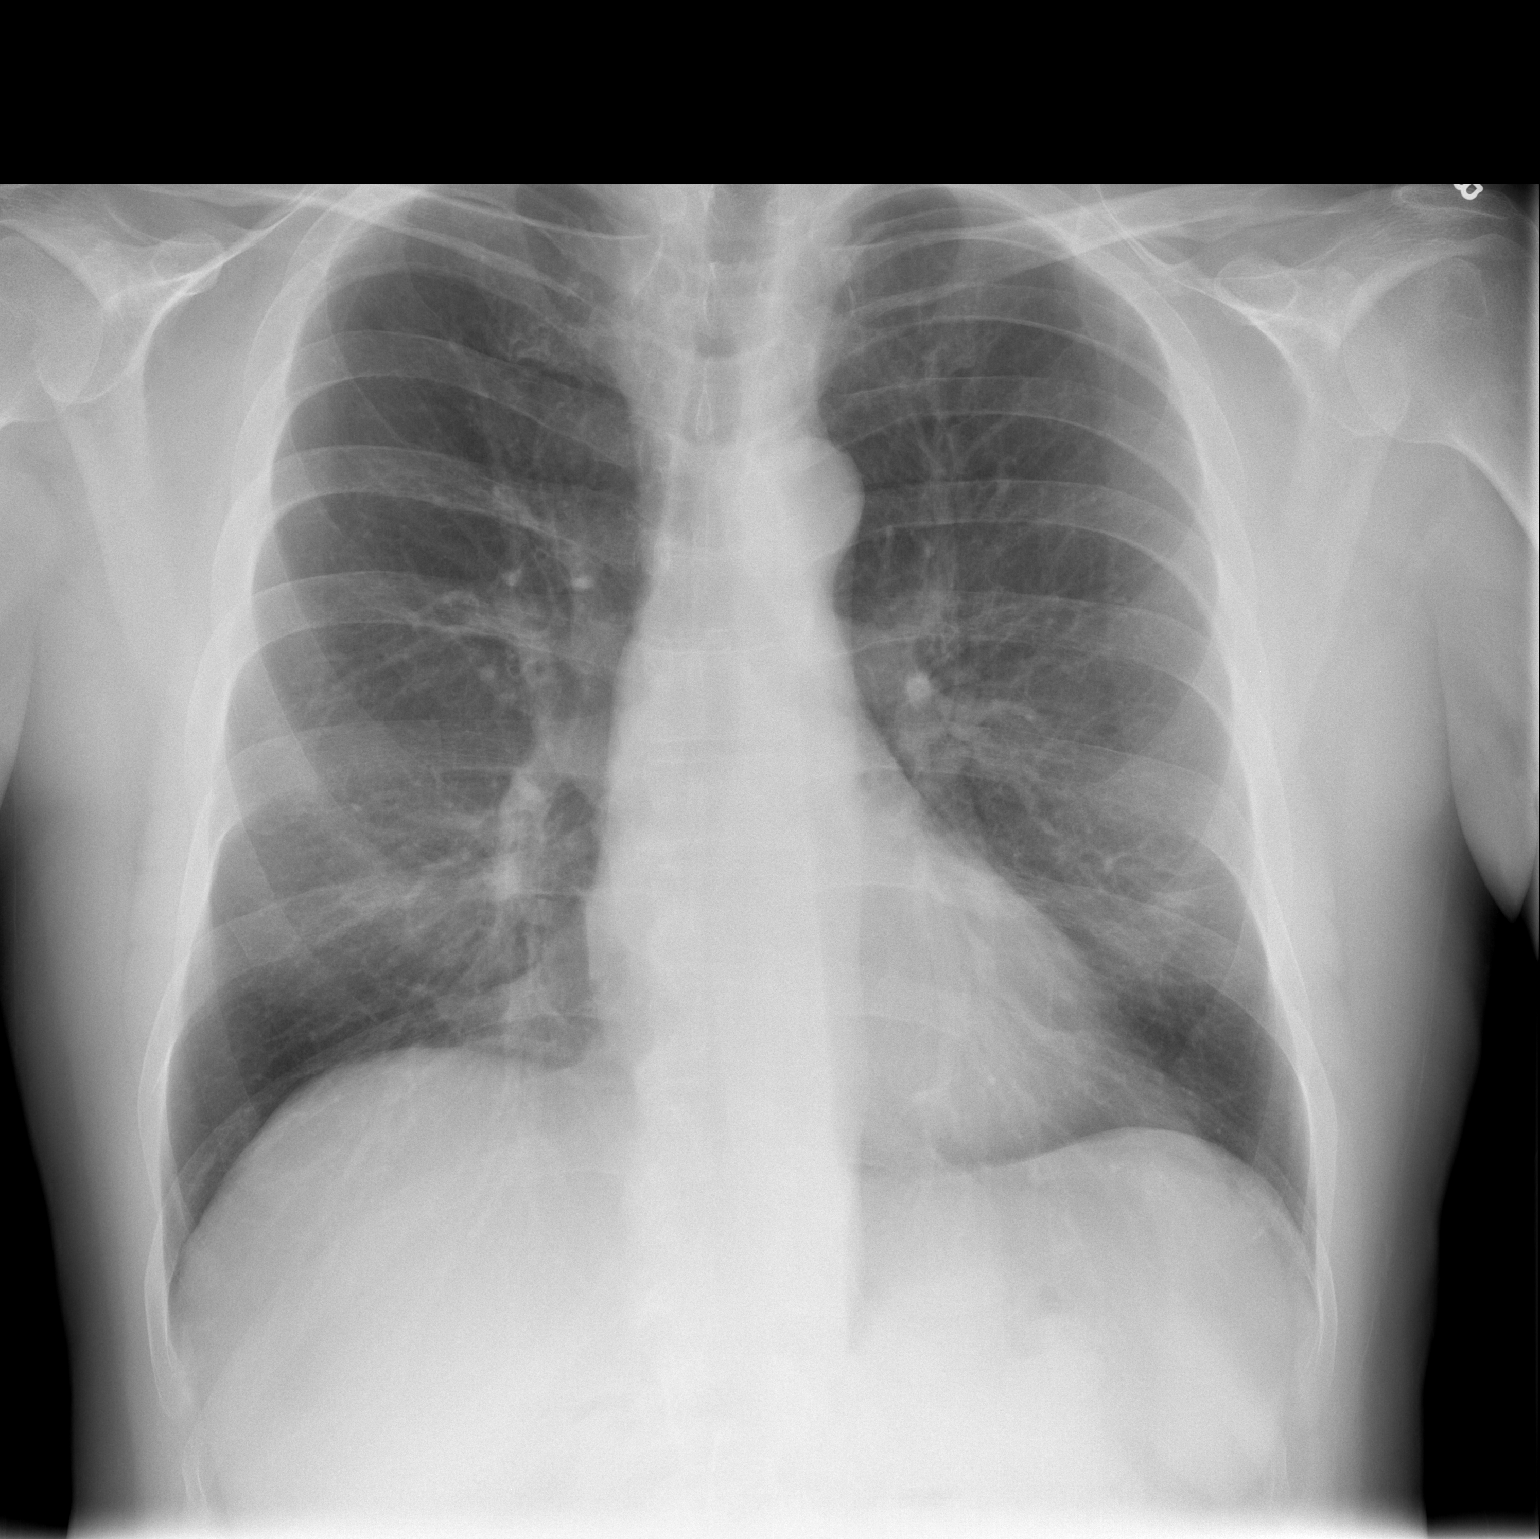

[w chest lat]
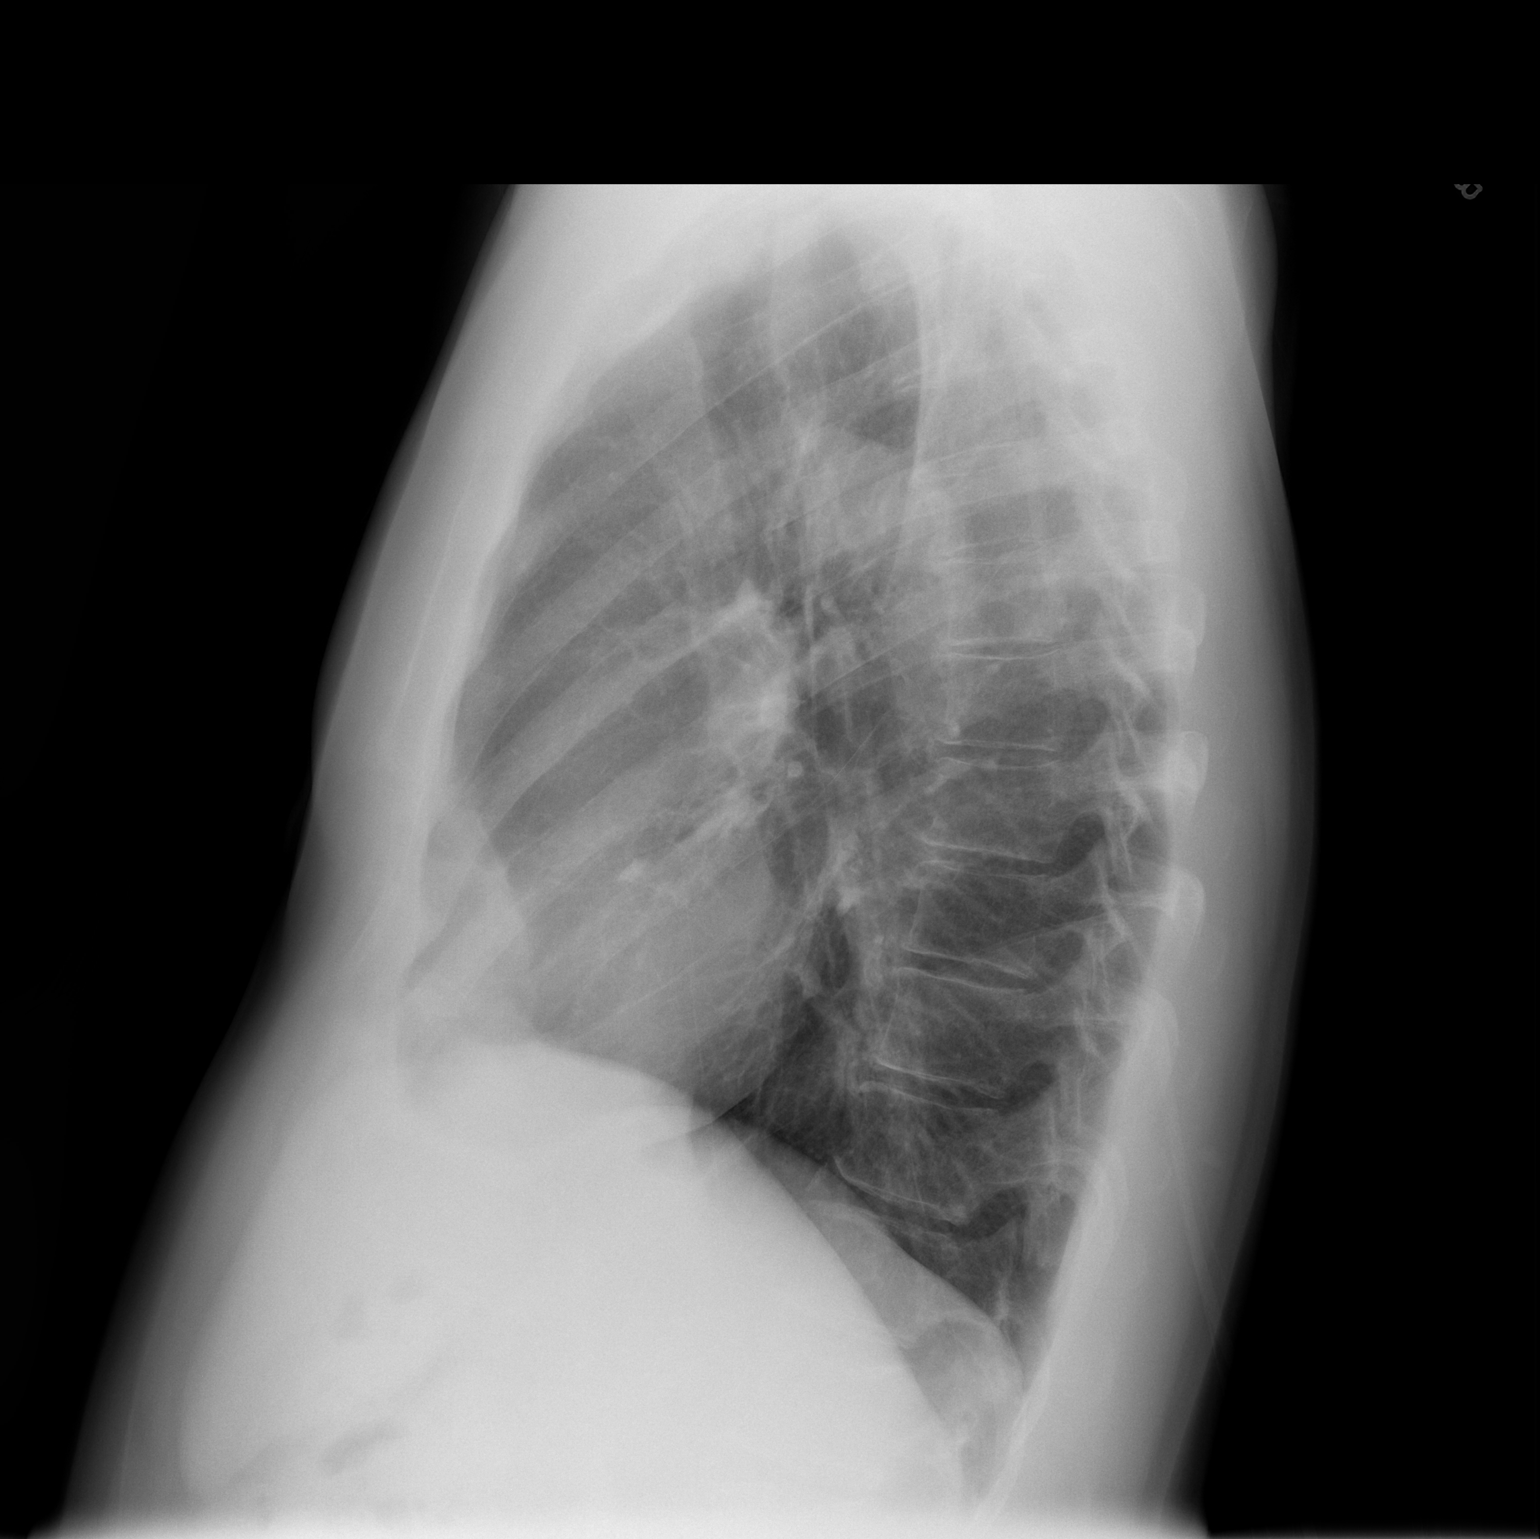

[2 of 2 positions shown; findings below may reference images not displayed]

FINDINGS: The lungs are well-aerated.  Mild chronic peribronchial
thickening is noted.  There is no evidence of focal opacification,
pleural effusion or pneumothorax.

The heart is normal in size; the mediastinal contour is within
normal limits.  No acute osseous abnormalities are seen.
IMPRESSION: No acute cardiopulmonary process seen.  Mild chronic peribronchial
thickening noted.

## 2014-07-05 ENCOUNTER — Encounter (HOSPITAL_COMMUNITY): Payer: Self-pay | Admitting: Emergency Medicine

## 2014-07-05 ENCOUNTER — Emergency Department (HOSPITAL_COMMUNITY)
Admission: EM | Admit: 2014-07-05 | Discharge: 2014-07-05 | Disposition: A | Discharge status: Home / Self Care | Payer: BC Managed Care – PPO | Attending: Emergency Medicine | Admitting: Emergency Medicine

## 2014-07-05 DIAGNOSIS — Z7951 Long term (current) use of inhaled steroids: Secondary | ICD-10-CM | POA: Diagnosis not present

## 2014-07-05 DIAGNOSIS — A09 Infectious gastroenteritis and colitis, unspecified: Secondary | ICD-10-CM | POA: Insufficient documentation

## 2014-07-05 DIAGNOSIS — F419 Anxiety disorder, unspecified: Secondary | ICD-10-CM | POA: Insufficient documentation

## 2014-07-05 DIAGNOSIS — Z8619 Personal history of other infectious and parasitic diseases: Secondary | ICD-10-CM | POA: Insufficient documentation

## 2014-07-05 DIAGNOSIS — K219 Gastro-esophageal reflux disease without esophagitis: Secondary | ICD-10-CM | POA: Insufficient documentation

## 2014-07-05 DIAGNOSIS — Z79899 Other long term (current) drug therapy: Secondary | ICD-10-CM | POA: Insufficient documentation

## 2014-07-05 DIAGNOSIS — F329 Major depressive disorder, single episode, unspecified: Secondary | ICD-10-CM | POA: Insufficient documentation

## 2014-07-05 DIAGNOSIS — M109 Gout, unspecified: Secondary | ICD-10-CM | POA: Diagnosis not present

## 2014-07-05 DIAGNOSIS — Z720 Tobacco use: Secondary | ICD-10-CM | POA: Insufficient documentation

## 2014-07-05 DIAGNOSIS — R531 Weakness: Secondary | ICD-10-CM | POA: Diagnosis present

## 2014-07-05 LAB — PROTIME-INR
INR: 1 (ref 0.00–1.49)
Prothrombin Time: 13.3 seconds (ref 11.6–15.2)

## 2014-07-05 LAB — CBC WITH DIFFERENTIAL/PLATELET
Basophils Absolute: 0 10*3/uL (ref 0.0–0.1)
Basophils Relative: 0 % (ref 0–1)
Eosinophils Absolute: 0.2 10*3/uL (ref 0.0–0.7)
Eosinophils Relative: 2 % (ref 0–5)
HCT: 42.5 % (ref 39.0–52.0)
Hemoglobin: 14.3 g/dL (ref 13.0–17.0)
Lymphocytes Relative: 26 % (ref 12–46)
Lymphs Abs: 3.1 10*3/uL (ref 0.7–4.0)
MCH: 30.6 pg (ref 26.0–34.0)
MCHC: 33.6 g/dL (ref 30.0–36.0)
MCV: 90.8 fL (ref 78.0–100.0)
Monocytes Absolute: 0.6 10*3/uL (ref 0.1–1.0)
Monocytes Relative: 5 % (ref 3–12)
Neutro Abs: 7.9 10*3/uL — ABNORMAL HIGH (ref 1.7–7.7)
Neutrophils Relative %: 67 % (ref 43–77)
Platelets: 263 10*3/uL (ref 150–400)
RBC: 4.68 MIL/uL (ref 4.22–5.81)
RDW: 12.5 % (ref 11.5–15.5)
WBC: 11.8 10*3/uL — ABNORMAL HIGH (ref 4.0–10.5)

## 2014-07-05 LAB — BASIC METABOLIC PANEL
Anion gap: 11 (ref 5–15)
BUN: 14 mg/dL (ref 6–23)
CO2: 26 mEq/L (ref 19–32)
Calcium: 10 mg/dL (ref 8.4–10.5)
Chloride: 109 mEq/L (ref 96–112)
Creatinine, Ser: 0.89 mg/dL (ref 0.50–1.35)
GFR calc Af Amer: >90 mL/min (ref >90)
GFR calc non Af Amer: >90 mL/min (ref >90)
Glucose, Bld: 86 mg/dL (ref 70–99)
Potassium: 5.2 mEq/L (ref 3.7–5.3)
Sodium: 146 mEq/L (ref 137–147)

## 2014-07-05 LAB — I-STAT CG4 LACTIC ACID, ED: Lactic Acid, Venous: 1.05 mmol/L (ref 0.5–2.2)

## 2014-07-05 MED ORDER — SODIUM CHLORIDE 0.9 % IV BOLUS (SEPSIS)
1000.0000 mL | Freq: Once | INTRAVENOUS | Status: AC
  Administered 2014-07-05: 1000 mL via INTRAVENOUS

## 2014-07-05 MED ORDER — CIPROFLOXACIN HCL 500 MG PO TABS: 500.0000 mg | ORAL_TABLET | Freq: Two times a day (BID) | ORAL | Status: DC

## 2014-07-05 MED ORDER — GLYCOPYRROLATE 0.2 MG/ML IJ SOLN
0.1000 mg | Freq: Once | INTRAMUSCULAR | Status: AC
  Administered 2014-07-05: 0.1 mg via INTRAVENOUS
  Filled 2014-07-05: qty 1

## 2014-07-05 MED ORDER — ONDANSETRON HCL 4 MG/2ML IJ SOLN
4.0000 mg | Freq: Once | INTRAMUSCULAR | Status: AC
  Administered 2014-07-05: 4 mg via INTRAVENOUS

## 2014-07-05 MED ORDER — METRONIDAZOLE 500 MG PO TABS: 500.0000 mg | ORAL_TABLET | Freq: Two times a day (BID) | ORAL | Status: DC

## 2014-07-05 MED ORDER — METRONIDAZOLE 500 MG PO TABS
500.0000 mg | ORAL_TABLET | Freq: Once | ORAL | Status: AC
  Administered 2014-07-05: 500 mg via ORAL
  Filled 2014-07-05: qty 1

## 2014-07-05 MED ORDER — ONDANSETRON 4 MG PO TBDP: 4.0000 mg | ORAL_TABLET | Freq: Three times a day (TID) | ORAL | Status: DC | PRN

## 2014-07-05 MED ORDER — CIPROFLOXACIN HCL 500 MG PO TABS
500.0000 mg | ORAL_TABLET | Freq: Once | ORAL | Status: AC
  Administered 2014-07-05: 500 mg via ORAL
  Filled 2014-07-05: qty 1

## 2014-07-05 MED ORDER — MORPHINE SULFATE 4 MG/ML IJ SOLN
4.0000 mg | INTRAMUSCULAR | Status: DC | PRN
  Administered 2014-07-05 (×2): 4 mg via INTRAVENOUS
  Filled 2014-07-05 (×2): qty 1

## 2014-07-05 MED ORDER — OXYCODONE-ACETAMINOPHEN 5-325 MG PO TABS: 2.0000 | ORAL_TABLET | ORAL | Status: DC | PRN

## 2014-07-05 MED ORDER — SODIUM CHLORIDE 0.9 % IV SOLN: Freq: Once | INTRAVENOUS | Status: DC

## 2014-07-05 MED ORDER — ONDANSETRON HCL 4 MG/2ML IJ SOLN
4.0000 mg | Freq: Once | INTRAMUSCULAR | Status: DC
  Filled 2014-07-05: qty 2

## 2014-07-05 NOTE — ED Notes (Signed)
Pt states "not as dizzy" post EMS intervention. Upon further assessment pt has edema to left foot related to gout per pt.

## 2014-07-05 NOTE — ED Provider Notes (Signed)
CSN: [id number]     Arrival date & time 07/05/14  1233 History   First MD Initiated Contact with Patient 07/05/14 1239     Chief Complaint  Patient presents with  . Weakness  . Diarrhea      HPI  Patient presents for evaluation with plain of abdominal pain, diarrhea, now bloody stools He has a history of gout. He had a recent flareup starting a few days ago. Given a prescription for indomethacin Wednesday night, took doses yesterday. Some cramping starting early this morning and episodes of diarrhea and then "times of mucus and dark blood". With his stool. He was a bit lightheaded. Felt better after IV fluids per paramedics. Right foot redness pain and gout symptoms.  Past Medical History  Diagnosis Date  . Fibromyalgia   . Neck pain   . Anxiety   . Esophageal reflux     eosinophil esophagitis  . Acute hepatitis C without mention of hepatic coma     Tx by [facility] for Hep.  negative for hep c 2/15.  . Depression    Past Surgical History  Procedure Laterality Date  . Shoulder surgery     Family History  Problem Relation Age of Onset  . Autoimmune disease Neg Hx   . Lung cancer Mother   . Heart Problems Father    History  Substance Use Topics  . Smoking status: Current Every Day Smoker -- 0.75 packs/day    Types: Cigarettes  . Smokeless tobacco: Never Used  . Alcohol Use: No    Review of Systems  Constitutional: Negative for fever, chills, diaphoresis, appetite change and fatigue.  HENT: Negative for mouth sores, sore throat and trouble swallowing.   Eyes: Negative for visual disturbance.  Respiratory: Negative for cough, chest tightness, shortness of breath and wheezing.   Cardiovascular: Negative for chest pain.  Gastrointestinal: Positive for nausea, abdominal pain, diarrhea and blood in stool. Negative for vomiting and abdominal distention.  Endocrine: Negative for polydipsia, polyphagia and polyuria.  Genitourinary: Negative for dysuria, frequency and hematuria.    Musculoskeletal: Negative for gait problem.       Pain. Redness and swelling.  Skin: Negative for color change, pallor and rash.  Neurological: Negative for dizziness, syncope, light-headedness and headaches.  Hematological: Does not bruise/bleed easily.  Psychiatric/Behavioral: Negative for behavioral problems and confusion.      Allergies  Prednisone and Tetanus toxoids  Home Medications   Prior to Admission medications   Medication Sig Start Date End Date Taking? Authorizing Provider  clonazePAM (KLONOPIN) 2 MG tablet Take 2 mg by mouth 2 (two) times daily as needed for anxiety.    Yes Historical Provider, MD  LORazepam (ATIVAN) 2 MG tablet Take 2 mg by mouth every 6 (six) hours as needed for anxiety.   Yes Historical Provider, MD  mometasone (NASONEX) 50 MCG/ACT nasal spray Place 2 sprays into the nose daily.   Yes Historical Provider, MD  zolpidem (AMBIEN) 10 MG tablet Take 10 mg by mouth at bedtime as needed for sleep.  10/02/13  Yes Historical Provider, MD  dicyclomine (BENTYL) 20 MG tablet Take 1 tablet (20 mg total) by mouth every 6 (six) hours as needed for spasms (for abdominal cramping). 05/13/14   Kalman Drape, MD  fluticasone (FLONASE) 50 MCG/ACT nasal spray Place 2 sprays into both nostrils daily as needed for allergies or rhinitis.    Historical Provider, MD  Multiple Vitamins-Minerals (ONE-A-DAY MENS 50+ ADVANTAGE PO) Take 1 tablet by mouth daily.  Historical Provider, MD  pantoprazole (PROTONIX) 20 MG tablet Take 2 tablets (40 mg total) by mouth daily. Patient taking differently: Take 40 mg by mouth daily as needed for heartburn.  05/13/14   Kalman Drape, MD  sucralfate (CARAFATE) 1 G tablet Take 1 tablet (1 g total) by mouth 4 (four) times daily. 05/13/14   Kalman Drape, MD   BP 127/88 mmHg  Pulse 79  Temp(Src) 98.3 F (36.8 C) (Oral)  Resp 20  SpO2 100% Physical Exam  Constitutional: He is oriented to person, place, and time. He appears well-developed and  well-nourished. No distress.  HENT:  Head: Normocephalic.  Eyes: Conjunctivae are normal. Pupils are equal, round, and reactive to light. No scleral icterus.  Neck: Normal range of motion. Neck supple. No thyromegaly present.  Cardiovascular: Normal rate and regular rhythm.  Exam reveals no gallop and no friction rub.   No murmur heard. Pulmonary/Chest: Effort normal and breath sounds normal. No respiratory distress. He has no wheezes. He has no rales.  Abdominal: Soft. Bowel sounds are normal. He exhibits no distension. There is no tenderness. There is no rebound.    Musculoskeletal: Normal range of motion.       Feet:  Neurological: He is alert and oriented to person, place, and time.  Skin: Skin is warm and dry. No rash noted.  Psychiatric: He has a normal mood and affect. His behavior is normal.    ED Course  Procedures (including critical care time) Labs Review Labs Reviewed  CBC WITH DIFFERENTIAL - Abnormal; Notable for the following:    WBC 11.8 (*)    Neutro Abs 7.9 (*)    All other components within normal limits  BASIC METABOLIC PANEL  PROTIME-INR  I-STAT CG4 LACTIC ACID, ED    Imaging Review No results found.   EKG Interpretation None      MDM   Final diagnoses:  Infectious colitis  Acute gout of right foot, unspecified cause    Patient feeling improved. Notice episodes of diarrhea here. Normal hemoglobin. Normal vitals. Ambulatory after hydration. He is appropriate for outpatient care. With the diarrhea and the bloody stools he will be unable to continue his indomethacin, or take colchicine. Plans Percocet for his gout. Flagyl for his colitis. Primary care follow-up. Careful return precautions reviewed with the patient.    Tanna Furry, MD 07/05/14 [phone]

## 2014-07-05 NOTE — ED Notes (Signed)
Bed: XY58 Expected date:  Expected time:  Means of arrival:  Comments: EMS-weakness

## 2014-07-05 NOTE — Discharge Instructions (Signed)
No Indomethacin until bloody stools resolve. Percocet for gout pain or Abdominal pain. Return to ER with heavier bleeding, worsening pain, or other symptoms.  Colitis Colitis is inflammation of the colon. Colitis can be a short-term or long-standing (chronic) illness. Crohn's disease and ulcerative colitis are 2 types of colitis which are chronic. They usually require lifelong treatment. CAUSES  There are many different causes of colitis, including:  Viruses.  Germs (bacteria).  Medicine reactions. SYMPTOMS   Diarrhea.  Intestinal bleeding.  Pain.  Fever.  Throwing up (vomiting).  Tiredness (fatigue).  Weight loss.  Bowel blockage. DIAGNOSIS  The diagnosis of colitis is based on examination and stool or blood tests. X-rays, CT scan, and colonoscopy may also be needed. TREATMENT  Treatment may include:  Fluids given through the vein (intravenously).  Bowel rest (nothing to eat or drink for a period of time).  Medicine for pain and diarrhea.  Medicines (antibiotics) that kill germs.  Cortisone medicines.  Surgery. HOME CARE INSTRUCTIONS   Get plenty of rest.  Drink enough water and fluids to keep your urine clear or pale yellow.  Eat a well-balanced diet.  Call your caregiver for follow-up as recommended. SEEK IMMEDIATE MEDICAL CARE IF:   You develop chills.  You have an oral temperature above 102 F (38.9 C), not controlled by medicine.  You have extreme weakness, fainting, or dehydration.  You have repeated vomiting.  You develop severe belly (abdominal) pain or are passing bloody or tarry stools. MAKE SURE YOU:   Understand these instructions.  Will watch your condition.  Will get help right away if you are not doing well or get worse. Document Released: 09/23/2004 Document Revised: 11/08/2011 Document Reviewed: 12/19/2009 [facility] Patient Information 2015 [place], [place]. This information is not intended to replace advice given to you by  your health care provider. Make sure you discuss any questions you have with your health care provider.  Gout Gout is when your joints become red, sore, and swell (inflamed). This is caused by the buildup of uric acid crystals in the joints. Uric acid is a chemical that is normally in the blood. If the level of uric acid gets too high in the blood, these crystals form in your joints and tissues. Over time, these crystals can form into masses near the joints and tissues. These masses can destroy bone and cause the bone to look misshapen (deformed). HOME CARE   Do not take aspirin for pain.  Only take medicine as told by your doctor.  Rest the joint as much as you can. When in bed, keep sheets and blankets off painful areas.  Keep the sore joints raised (elevated).  Put warm or cold packs on painful joints. Use of warm or cold packs depends on which works best for you.  Use crutches if the painful joint is in your leg.  Drink enough fluids to keep your pee (urine) clear or pale yellow. Limit alcohol, sugary drinks, and drinks with fructose in them.  Follow your diet instructions. Pay careful attention to how much protein you eat. Include fruits, vegetables, whole grains, and fat-free or low-fat milk products in your daily diet. Talk to your doctor or dietitian about the use of coffee, vitamin C, and cherries. These may help lower uric acid levels.  Keep a healthy body weight. GET HELP RIGHT AWAY IF:   You have watery poop (diarrhea), throw up (vomit), or have any side effects from medicines.  You do not feel better in 24 hours,  or you are getting worse.  Your joint becomes suddenly more tender, and you have chills or a fever. MAKE SURE YOU:   Understand these instructions.  Will watch your condition.  Will get help right away if you are not doing well or get worse. Document Released: 05/25/2008 Document Revised: 12/31/2013 Document Reviewed: 03/29/2012 [facility] Patient  Information 2015 [place], [place]. This information is not intended to replace advice given to you by your health care provider. Make sure you discuss any questions you have with your health care provider.

## 2014-07-05 NOTE — ED Notes (Signed)
Pt denies lower abdominal pain or cramping at present time and reports decrease in nausea. Pt reports 8/10 acute/chronic gout to left foot.

## 2014-07-05 NOTE — ED Notes (Addendum)
Per EMS pt complaint of weakness and diarrhea starting this am. Pt started Indomethacin for gout yesterday. Pt given 500 ml NS and 4 mg zofran IV en route with EMS. Pt orthostatic on scene with EMS upon standing.

## 2014-07-09 ENCOUNTER — Encounter (HOSPITAL_COMMUNITY): Payer: Self-pay | Admitting: Family Medicine

## 2014-07-09 ENCOUNTER — Emergency Department (HOSPITAL_COMMUNITY): Payer: BC Managed Care – PPO

## 2014-07-09 ENCOUNTER — Emergency Department (HOSPITAL_COMMUNITY)
Admission: EM | Admit: 2014-07-09 | Discharge: 2014-07-09 | Disposition: A | Discharge status: Home / Self Care | Payer: BC Managed Care – PPO | Attending: Emergency Medicine | Admitting: Emergency Medicine

## 2014-07-09 DIAGNOSIS — K210 Gastro-esophageal reflux disease with esophagitis: Secondary | ICD-10-CM | POA: Insufficient documentation

## 2014-07-09 DIAGNOSIS — Z792 Long term (current) use of antibiotics: Secondary | ICD-10-CM | POA: Diagnosis not present

## 2014-07-09 DIAGNOSIS — M109 Gout, unspecified: Secondary | ICD-10-CM | POA: Insufficient documentation

## 2014-07-09 DIAGNOSIS — Z720 Tobacco use: Secondary | ICD-10-CM | POA: Diagnosis not present

## 2014-07-09 DIAGNOSIS — Z79899 Other long term (current) drug therapy: Secondary | ICD-10-CM | POA: Insufficient documentation

## 2014-07-09 DIAGNOSIS — K529 Noninfective gastroenteritis and colitis, unspecified: Secondary | ICD-10-CM

## 2014-07-09 DIAGNOSIS — Z8619 Personal history of other infectious and parasitic diseases: Secondary | ICD-10-CM | POA: Insufficient documentation

## 2014-07-09 DIAGNOSIS — F419 Anxiety disorder, unspecified: Secondary | ICD-10-CM | POA: Diagnosis not present

## 2014-07-09 DIAGNOSIS — K5190 Ulcerative colitis, unspecified, without complications: Secondary | ICD-10-CM | POA: Diagnosis not present

## 2014-07-09 DIAGNOSIS — K921 Melena: Secondary | ICD-10-CM | POA: Diagnosis present

## 2014-07-09 DIAGNOSIS — M797 Fibromyalgia: Secondary | ICD-10-CM | POA: Insufficient documentation

## 2014-07-09 DIAGNOSIS — Z7951 Long term (current) use of inhaled steroids: Secondary | ICD-10-CM | POA: Diagnosis not present

## 2014-07-09 DIAGNOSIS — F32A Depression, unspecified: Secondary | ICD-10-CM | POA: Insufficient documentation

## 2014-07-09 DIAGNOSIS — F329 Major depressive disorder, single episode, unspecified: Secondary | ICD-10-CM | POA: Insufficient documentation

## 2014-07-09 DIAGNOSIS — R062 Wheezing: Secondary | ICD-10-CM | POA: Diagnosis not present

## 2014-07-09 DIAGNOSIS — R1030 Lower abdominal pain, unspecified: Secondary | ICD-10-CM

## 2014-07-09 LAB — COMPREHENSIVE METABOLIC PANEL
ALT: 20 U/L (ref 0–53)
AST: 25 U/L (ref 0–37)
Albumin: 3.3 g/dL — ABNORMAL LOW (ref 3.5–5.2)
Alkaline Phosphatase: 50 U/L (ref 39–117)
Anion gap: 12 (ref 5–15)
BUN: 18 mg/dL (ref 6–23)
CO2: 24 mEq/L (ref 19–32)
Calcium: 8.7 mg/dL (ref 8.4–10.5)
Chloride: 106 mEq/L (ref 96–112)
Creatinine, Ser: 0.89 mg/dL (ref 0.50–1.35)
GFR calc Af Amer: >90 mL/min (ref >90)
GFR calc non Af Amer: >90 mL/min (ref >90)
Glucose, Bld: 94 mg/dL (ref 70–99)
Potassium: 3.8 mEq/L (ref 3.7–5.3)
Sodium: 142 mEq/L (ref 137–147)
Total Bilirubin: 0.3 mg/dL (ref 0.3–1.2)
Total Protein: 6.6 g/dL (ref 6.0–8.3)

## 2014-07-09 LAB — CBC WITH DIFFERENTIAL/PLATELET
Basophils Absolute: 0 10*3/uL (ref 0.0–0.1)
Basophils Relative: 0 % (ref 0–1)
Eosinophils Absolute: 0.2 10*3/uL (ref 0.0–0.7)
Eosinophils Relative: 4 % (ref 0–5)
HCT: 39.8 % (ref 39.0–52.0)
Hemoglobin: 13 g/dL (ref 13.0–17.0)
Lymphocytes Relative: 18 % (ref 12–46)
Lymphs Abs: 1.1 10*3/uL (ref 0.7–4.0)
MCH: 29.7 pg (ref 26.0–34.0)
MCHC: 32.7 g/dL (ref 30.0–36.0)
MCV: 90.9 fL (ref 78.0–100.0)
Monocytes Absolute: 0.6 10*3/uL (ref 0.1–1.0)
Monocytes Relative: 10 % (ref 3–12)
Neutro Abs: 4.3 10*3/uL (ref 1.7–7.7)
Neutrophils Relative %: 68 % (ref 43–77)
Platelets: 241 10*3/uL (ref 150–400)
RBC: 4.38 MIL/uL (ref 4.22–5.81)
RDW: 12.7 % (ref 11.5–15.5)
WBC: 6.3 10*3/uL (ref 4.0–10.5)

## 2014-07-09 LAB — LIPASE, BLOOD: Lipase: 15 U/L (ref 11–59)

## 2014-07-09 LAB — POC OCCULT BLOOD, ED: Fecal Occult Bld: NEGATIVE

## 2014-07-09 MED ORDER — IOHEXOL 300 MG/ML  SOLN
100.0000 mL | Freq: Once | INTRAMUSCULAR | Status: AC | PRN
  Administered 2014-07-09: 100 mL via INTRAVENOUS

## 2014-07-09 MED ORDER — ONDANSETRON HCL 4 MG/2ML IJ SOLN
4.0000 mg | Freq: Once | INTRAMUSCULAR | Status: AC
  Administered 2014-07-09: 4 mg via INTRAVENOUS
  Filled 2014-07-09: qty 2

## 2014-07-09 MED ORDER — IOHEXOL 300 MG/ML  SOLN
50.0000 mL | Freq: Once | INTRAMUSCULAR | Status: AC | PRN
  Administered 2014-07-09: 50 mL via ORAL

## 2014-07-09 MED ORDER — MORPHINE SULFATE 4 MG/ML IJ SOLN
4.0000 mg | Freq: Once | INTRAMUSCULAR | Status: AC
  Administered 2014-07-09: 4 mg via INTRAVENOUS
  Filled 2014-07-09: qty 1

## 2014-07-09 MED ORDER — SODIUM CHLORIDE 0.9 % IV BOLUS (SEPSIS)
1000.0000 mL | Freq: Once | INTRAVENOUS | Status: AC
  Administered 2014-07-09: 1000 mL via INTRAVENOUS

## 2014-07-09 MED ORDER — OXYCODONE-ACETAMINOPHEN 5-325 MG PO TABS
2.0000 | ORAL_TABLET | Freq: Once | ORAL | Status: AC
  Administered 2014-07-09: 2 via ORAL
  Filled 2014-07-09: qty 2

## 2014-07-09 MED ORDER — PROMETHAZINE HCL 25 MG/ML IJ SOLN
12.5000 mg | Freq: Once | INTRAMUSCULAR | Status: AC
  Administered 2014-07-09: 12.5 mg via INTRAVENOUS
  Filled 2014-07-09 (×2): qty 1

## 2014-07-09 NOTE — Discharge Instructions (Signed)
Colitis  Colitis is inflammation of the colon. Colitis can be a short-term or long-standing (chronic) illness. Crohn's disease and ulcerative colitis are 2 types of colitis which are chronic. They usually require lifelong treatment.  CAUSES   There are many different causes of colitis, including:  · Viruses.  · Germs (bacteria).  · Medicine reactions.  SYMPTOMS   · Diarrhea.  · Intestinal bleeding.  · Pain.  · Fever.  · Throwing up (vomiting).  · Tiredness (fatigue).  · Weight loss.  · Bowel blockage.  DIAGNOSIS   The diagnosis of colitis is based on examination and stool or blood tests. X-rays, CT scan, and colonoscopy may also be needed.  TREATMENT   Treatment may include:  · Fluids given through the vein (intravenously).  · Bowel rest (nothing to eat or drink for a period of time).  · Medicine for pain and diarrhea.  · Medicines (antibiotics) that kill germs.  · Cortisone medicines.  · Surgery.  HOME CARE INSTRUCTIONS   · Get plenty of rest.  · Drink enough water and fluids to keep your urine clear or pale yellow.  · Eat a well-balanced diet.  · Call your caregiver for follow-up as recommended.  SEEK IMMEDIATE MEDICAL CARE IF:   · You develop chills.  · You have an oral temperature above 102° F (38.9° C), not controlled by medicine.  · You have extreme weakness, fainting, or dehydration.  · You have repeated vomiting.  · You develop severe belly (abdominal) pain or are passing bloody or tarry stools.  MAKE SURE YOU:   · Understand these instructions.  · Will watch your condition.  · Will get help right away if you are not doing well or get worse.  Document Released: 09/23/2004 Document Revised: 11/08/2011 Document Reviewed: 12/19/2009  [employer]® Patient Information ©2015 [employer]. This information is not intended to replace advice given to you by your health care provider. Make sure you discuss any questions you have with your health care provider.

## 2014-07-09 NOTE — ED Notes (Signed)
MD at bedside. 

## 2014-07-09 NOTE — ED Notes (Signed)
Patient brought in specimen of stool-color brown on tissue paper-hemoccult collected

## 2014-07-09 NOTE — ED Notes (Signed)
Hourly rounding completed Patient asking if he needs CT scan if POC occult was negative Patient asking to speak with EDP EDP made aware  Patient remains in NAD, texting on cell phone Side rails up, call bell in reach

## 2014-07-09 NOTE — ED Notes (Signed)
Patient able to tolerate PO challenge Patient has had NO episodes of emesis while here in ED Patient in NAD

## 2014-07-09 NOTE — ED Notes (Signed)
Patient ambulated to ED waiting room without difficulty or nursing assistance Patient alert and oriented x 4 and in NAD at time of DC home

## 2014-07-09 NOTE — ED Notes (Addendum)
Patient was seen at [facility] on Friday for same symptoms. Dx with colitis. Patient had a bowel movement on Monday (aware that stool could be dark) and has had two bowel movements this morning since 3am. EMS reports pt's stool is dark, tarry pieces, right upper/lower abd pain, and dizziness. Patient walked out to EMS truck with no difficulties. Pt was given ZOFRAN 4mg  IV en route.

## 2014-07-09 NOTE — ED Notes (Signed)
Assumed care of patient Patient alert and oriented x 4 and in NAD--texting on cell phone Patient states that he saw "black tarry stool" this morning Patient informed that POC occult done in ED was negative---patient then stated that the last time he actually saw blood was on Friday Patient medicated, see MAR No active N/V--drinking PO contrast for CT Side rails up, call bell in reach

## 2014-07-09 NOTE — ED Notes (Signed)
Patient medicated for pain, see MAR Patient previously medicated for nausea, see MAR Patient has had NO episodes of emesis while in ED PO challenge in progress Patient informed that he needs to secure a ride home due to admin of multiple pain medications and Phenergan Patient stated that he will call his wife for a ride  Patient remains in NAD Side rails up, call bell in reach

## 2014-07-09 NOTE — ED Notes (Signed)
Patient transported to CT via stretcher Patient in NAD upon leaving for testing Will medicate patient upon return to patient room and when medication is sent from Pharmacy

## 2014-07-09 NOTE — ED Notes (Signed)
Bed: LI10 Expected date:  Expected time:  Means of arrival:  Comments: EMS 58 yo male with tarry stool/recent diagnosis of colitis

## 2014-07-09 NOTE — ED Notes (Signed)
Patient able to get in touch with wife Wife states that she will be here shortly to pick patient up (patient arrived by EMS) Patient offered wheelchair multiple times by this nurse--patient declined, states that he wants to walk to ED waiting area Patient able to stand up and ambulate around room without difficulty and with steady gait

## 2014-07-09 NOTE — ED Notes (Signed)
Patient states that he is unable to get in touch with his wife to provide transportation home Patient asking if he can take a cab home--verified with ED Charge that patient can take cab List of multiple cab companies given to patient, per patient request

## 2014-07-09 NOTE — ED Provider Notes (Signed)
CSN: [id number]     Arrival date & time 07/09/14  0645 History   First MD Initiated Contact with Patient 07/09/14 [phone]     Chief Complaint  Patient presents with  . Melena     (Consider location/radiation/quality/duration/timing/severity/associated sxs/prior Treatment) Patient is a 58 y.o. male presenting with abdominal pain. The history is provided by the patient. No language interpreter was used.  Abdominal Pain Pain location:  LLQ, suprapubic and RLQ Pain quality: aching and dull   Pain radiates to:  R flank Pain severity:  Moderate Duration:  5 days Timing:  Constant Progression:  Worsening Chronicity:  New Relieved by: PO narcotics. Worsened by:  Nothing tried Ineffective treatments:  None tried (Cipro, Flagyl) Associated symptoms: anorexia, diarrhea, fatigue, melena and nausea   Associated symptoms: no chest pain, no chills, no constipation, no cough, no dysuria, no fever, no shortness of breath and no vomiting   Risk factors: NSAID use (recent NSAID use for gout, has stopped 5 days ago)   Risk factors: has not had multiple surgeries, not obese and no recent hospitalization     Past Medical History  Diagnosis Date  . Fibromyalgia   . Neck pain   . Anxiety   . Esophageal reflux     eosinophil esophagitis  . Acute hepatitis C without mention of hepatic coma     Tx by [facility] for Hep.  negative for hep c 2/15.  . Depression   . Colitis    Past Surgical History  Procedure Laterality Date  . Shoulder surgery     Family History  Problem Relation Age of Onset  . Autoimmune disease Neg Hx   . Lung cancer Mother   . Heart Problems Father    History  Substance Use Topics  . Smoking status: Current Every Day Smoker -- 0.50 packs/day    Types: Cigarettes  . Smokeless tobacco: Never Used  . Alcohol Use: No    Review of Systems  Constitutional: Positive for fatigue. Negative for fever, chills, activity change and appetite change.  HENT: Negative for congestion,  facial swelling, rhinorrhea and trouble swallowing.   Eyes: Negative for photophobia and pain.  Respiratory: Negative for cough, chest tightness and shortness of breath.   Cardiovascular: Negative for chest pain and leg swelling.  Gastrointestinal: Positive for nausea, abdominal pain, diarrhea, blood in stool, melena and anorexia. Negative for vomiting and constipation.  Endocrine: Negative for polydipsia and polyuria.  Genitourinary: Negative for dysuria, urgency, decreased urine volume and difficulty urinating.  Musculoskeletal: Negative for back pain and gait problem.  Skin: Negative for color change, rash and wound.  Allergic/Immunologic: Negative for immunocompromised state.  Neurological: Negative for dizziness, facial asymmetry, speech difficulty, weakness, numbness and headaches.  Psychiatric/Behavioral: Negative for confusion, decreased concentration and agitation.      Allergies  Prednisone and Tetanus toxoids  Home Medications   Prior to Admission medications   Medication Sig Start Date End Date Taking? Authorizing Provider  ciprofloxacin (CIPRO) 500 MG tablet Take 1 tablet (500 mg total) by mouth every 12 (twelve) hours. 07/05/14  Yes Tanna Furry, MD  clonazePAM (KLONOPIN) 2 MG tablet Take 2 mg by mouth 2 (two) times daily as needed for anxiety.    Yes Historical Provider, MD  dicyclomine (BENTYL) 20 MG tablet Take 1 tablet (20 mg total) by mouth every 6 (six) hours as needed for spasms (for abdominal cramping). 05/13/14  Yes Kalman Drape, MD  DM-Doxylamine-Acetaminophen (VICKS NYQUIL COLD & FLU NIGHT) 15-6.25-325 MG/15ML LIQD Take  5 mLs by mouth at bedtime as needed (for cold and sleep).   Yes Historical Provider, MD  fluticasone (FLONASE) 50 MCG/ACT nasal spray Place 2 sprays into both nostrils daily as needed for allergies or rhinitis.   Yes Historical Provider, MD  LORazepam (ATIVAN) 2 MG tablet Take 2 mg by mouth every 6 (six) hours as needed for anxiety.   Yes Historical  Provider, MD  metroNIDAZOLE (FLAGYL) 500 MG tablet Take 1 tablet (500 mg total) by mouth 2 (two) times daily. 07/05/14  Yes Tanna Furry, MD  mometasone (NASONEX) 50 MCG/ACT nasal spray Place 2 sprays into the nose daily.   Yes Historical Provider, MD  ondansetron (ZOFRAN ODT) 4 MG disintegrating tablet Take 1 tablet (4 mg total) by mouth every 8 (eight) hours as needed for nausea. 07/05/14  Yes Tanna Furry, MD  oxyCODONE-acetaminophen (PERCOCET/ROXICET) 5-325 MG per tablet Take 2 tablets by mouth every 4 (four) hours as needed. 07/05/14  Yes Tanna Furry, MD  pantoprazole (PROTONIX) 20 MG tablet Take 2 tablets (40 mg total) by mouth daily. Patient taking differently: Take 40 mg by mouth daily as needed for heartburn.  05/13/14  Yes Kalman Drape, MD  zolpidem (AMBIEN) 10 MG tablet Take 10 mg by mouth at bedtime as needed for sleep.  10/02/13  Yes Historical Provider, MD  Multiple Vitamins-Minerals (ONE-A-DAY MENS 50+ ADVANTAGE PO) Take 1 tablet by mouth daily.    Historical Provider, MD  sucralfate (CARAFATE) 1 G tablet Take 1 tablet (1 g total) by mouth 4 (four) times daily. 05/13/14   Kalman Drape, MD   BP 145/82 mmHg  Pulse 86  Temp(Src) 98.6 F (37 C) (Oral)  Resp 18  Ht 5\' 8"  (1.727 m)  Wt 195 lb (88.451 kg)  BMI 29.66 kg/m2  SpO2 100% Physical Exam  Constitutional: He is oriented to person, place, and time. He appears well-developed and well-nourished. No distress.  HENT:  Head: Normocephalic and atraumatic.  Mouth/Throat: No oropharyngeal exudate.  Eyes: Pupils are equal, round, and reactive to light.  Neck: Normal range of motion. Neck supple.  Cardiovascular: Normal rate, regular rhythm and normal heart sounds.  Exam reveals no gallop and no friction rub.   No murmur heard. Pulmonary/Chest: Effort normal. No respiratory distress. He has wheezes in the right lower field and the left lower field. He has no rales.  Abdominal: Soft. Bowel sounds are normal. He exhibits no distension and no  mass. There is tenderness in the right lower quadrant, suprapubic area and left lower quadrant. There is no rigidity, no rebound and no guarding.  Tenderness to palpation in left lower quadrant greater than right lower quadrant  Musculoskeletal: Normal range of motion. He exhibits no edema or tenderness.  Neurological: He is alert and oriented to person, place, and time.  Skin: Skin is warm and dry.  Psychiatric: He has a normal mood and affect.    ED Course  Procedures (including critical care time) Labs Review Labs Reviewed  COMPREHENSIVE METABOLIC PANEL - Abnormal; Notable for the following:    Albumin 3.3 (*)    All other components within normal limits  CBC WITH DIFFERENTIAL  LIPASE, BLOOD  POC OCCULT BLOOD, ED    Imaging Review Ct Abdomen Pelvis W Contrast  07/09/2014   CLINICAL DATA:  58 year old male with history of right upper quadrant/right lower quadrant pain, nausea, weight loss and low grade fever. Initial encounter  EXAM: CT ABDOMEN AND PELVIS WITH CONTRAST  TECHNIQUE: Multidetector CT imaging of  the abdomen and pelvis was performed using the standard protocol following bolus administration of intravenous contrast. Delayed images are also provided for evaluation.  CONTRAST:  1100mL OMNIPAQUE IOHEXOL 300 MG/ML  SOLN  COMPARISON:  None.  FINDINGS: Chest:The partially visualized lung bases are unremarkable.  Liver: No suspicious hepatic mass is seen. There is no intrahepatic ductal dilatation.  Gallbladder: There is no abnormal gallbladder wall thickening. There is no pericholecystic fluid. There is no extrahepatic biliary ductal dilatation.  Spleen: The spleen is unremarkable. A splenule is incidentally noted.  Pancreas: The pancreas is unremarkable.  Adrenal glands: The adrenal glands are unremarkable.  Kidneys: There is no nephrolithiasis or hydronephrosis. No suspicious renal mass is seen. A subcentimeter left lower pole renal lesion is too small to characterize. Mild bilateral  perinephric fat stranding is present, likely physiologic.  Bowel/gastrointestinal tract: There is no abnormal bowel wall thickening or inflammatory change. There is no evidence of bowel obstruction. A few scattered colonic diverticula are present without evidence for acute diverticulitis. The appendix is normal.  Pelvis: There is no pelvic mass. There is mild prominence of the prostate gland.  Miscellaneous: There is no suspicious adenopathy. A few scattered atherosclerotic aortic calcifications are present.  Osseous structures: There is no acute osseous abnormality. Multilevel degenerative changes of the spine are present.  IMPRESSION: No CT evidence of an acute intra-abdominal or pelvic process.   Electronically Signed   By: Rosemarie Ax   On: 07/09/2014 09:06     EKG Interpretation None      MDM   Final diagnoses:  Lower abdominal pain  Melena  Colitis    Pt is a 58 y.o. male with Pmhx as above who presents with 6 days of low abdominal pain and bloody stool. Patient was seen for same on 06/04/14 was diagnosed with colitis given Cipro, Flagyl and by mouth narcotics. He states that he initially felt better for a day or two, but is now feeling worse again and is having dark tarry stool. He states he had 3 or 4 loose dark tarry stools this morning. He has had nausea but no vomiting, no fevers or chills. He's had no dysuria. Pain is constant, dull aching pain with some radiation to the right flank. No aggravating or alleviating symptoms. On physical exam vital signs are stable he is in acute distress. Positive tenderness to palpation in the lower abdomen with the left lower quadrant greater than right lower quadrant. No rebound or guarding. Stool sample collected by nursing was heme-negative.        Ernestina Patches, MD 07/09/14 2039

## 2014-07-17 ENCOUNTER — Ambulatory Visit: Payer: BC Managed Care – PPO | Admitting: Nurse Practitioner

## 2014-09-10 ENCOUNTER — Emergency Department (HOSPITAL_BASED_OUTPATIENT_CLINIC_OR_DEPARTMENT_OTHER)
Admission: EM | Admit: 2014-09-10 | Discharge: 2014-09-10 | Disposition: A | Discharge status: Home / Self Care | Payer: No Typology Code available for payment source | Attending: Emergency Medicine | Admitting: Emergency Medicine

## 2014-09-10 ENCOUNTER — Emergency Department (HOSPITAL_BASED_OUTPATIENT_CLINIC_OR_DEPARTMENT_OTHER): Payer: No Typology Code available for payment source

## 2014-09-10 ENCOUNTER — Encounter (HOSPITAL_BASED_OUTPATIENT_CLINIC_OR_DEPARTMENT_OTHER): Payer: Self-pay | Admitting: Emergency Medicine

## 2014-09-10 DIAGNOSIS — Z720 Tobacco use: Secondary | ICD-10-CM | POA: Diagnosis not present

## 2014-09-10 DIAGNOSIS — Y9389 Activity, other specified: Secondary | ICD-10-CM | POA: Diagnosis not present

## 2014-09-10 DIAGNOSIS — Z8719 Personal history of other diseases of the digestive system: Secondary | ICD-10-CM | POA: Diagnosis not present

## 2014-09-10 DIAGNOSIS — S3992XA Unspecified injury of lower back, initial encounter: Secondary | ICD-10-CM | POA: Insufficient documentation

## 2014-09-10 DIAGNOSIS — F419 Anxiety disorder, unspecified: Secondary | ICD-10-CM | POA: Diagnosis not present

## 2014-09-10 DIAGNOSIS — Y998 Other external cause status: Secondary | ICD-10-CM | POA: Diagnosis not present

## 2014-09-10 DIAGNOSIS — S139XXA Sprain of joints and ligaments of unspecified parts of neck, initial encounter: Secondary | ICD-10-CM

## 2014-09-10 DIAGNOSIS — S161XXA Strain of muscle, fascia and tendon at neck level, initial encounter: Secondary | ICD-10-CM | POA: Diagnosis not present

## 2014-09-10 DIAGNOSIS — M545 Low back pain, unspecified: Secondary | ICD-10-CM

## 2014-09-10 DIAGNOSIS — Z8619 Personal history of other infectious and parasitic diseases: Secondary | ICD-10-CM | POA: Diagnosis not present

## 2014-09-10 DIAGNOSIS — Z7951 Long term (current) use of inhaled steroids: Secondary | ICD-10-CM | POA: Diagnosis not present

## 2014-09-10 DIAGNOSIS — S24109A Unspecified injury at unspecified level of thoracic spinal cord, initial encounter: Secondary | ICD-10-CM | POA: Insufficient documentation

## 2014-09-10 DIAGNOSIS — Y92410 Unspecified street and highway as the place of occurrence of the external cause: Secondary | ICD-10-CM | POA: Insufficient documentation

## 2014-09-10 DIAGNOSIS — Z79899 Other long term (current) drug therapy: Secondary | ICD-10-CM | POA: Insufficient documentation

## 2014-09-10 DIAGNOSIS — K219 Gastro-esophageal reflux disease without esophagitis: Secondary | ICD-10-CM | POA: Diagnosis not present

## 2014-09-10 DIAGNOSIS — S199XXA Unspecified injury of neck, initial encounter: Secondary | ICD-10-CM | POA: Diagnosis present

## 2014-09-10 DIAGNOSIS — M546 Pain in thoracic spine: Secondary | ICD-10-CM

## 2014-09-10 MED ORDER — CYCLOBENZAPRINE HCL 10 MG PO TABS: 5.0000 mg | ORAL_TABLET | Freq: Once | ORAL | Status: DC

## 2014-09-10 MED ORDER — KETOROLAC TROMETHAMINE 60 MG/2ML IM SOLN: 30.0000 mg | Freq: Once | INTRAMUSCULAR | Status: AC

## 2014-09-10 MED ORDER — KETOROLAC TROMETHAMINE 30 MG/ML IJ SOLN
INTRAMUSCULAR | Status: AC
Start: 2014-09-10 — End: 2014-09-10
  Administered 2014-09-10: 30 mg via INTRAMUSCULAR
  Filled 2014-09-10: qty 1

## 2014-09-10 MED ORDER — CYCLOBENZAPRINE HCL 10 MG PO TABS: 10.0000 mg | ORAL_TABLET | Freq: Two times a day (BID) | ORAL | Status: DC | PRN

## 2014-09-10 NOTE — ED Notes (Signed)
Pt reports taking 2 extra strength tylenol this morning with no relief. Sts the pain has gotten worse.

## 2014-09-10 NOTE — Discharge Instructions (Signed)
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
## 2014-09-17 IMAGING — CR DG CHEST 2V
2 series · 2 of 2 positions shown · non-contrast
Comparison: 07/27/2012

CLINICAL DATA: Chest pain

CHEST - 2 VIEW

[w chest pa]
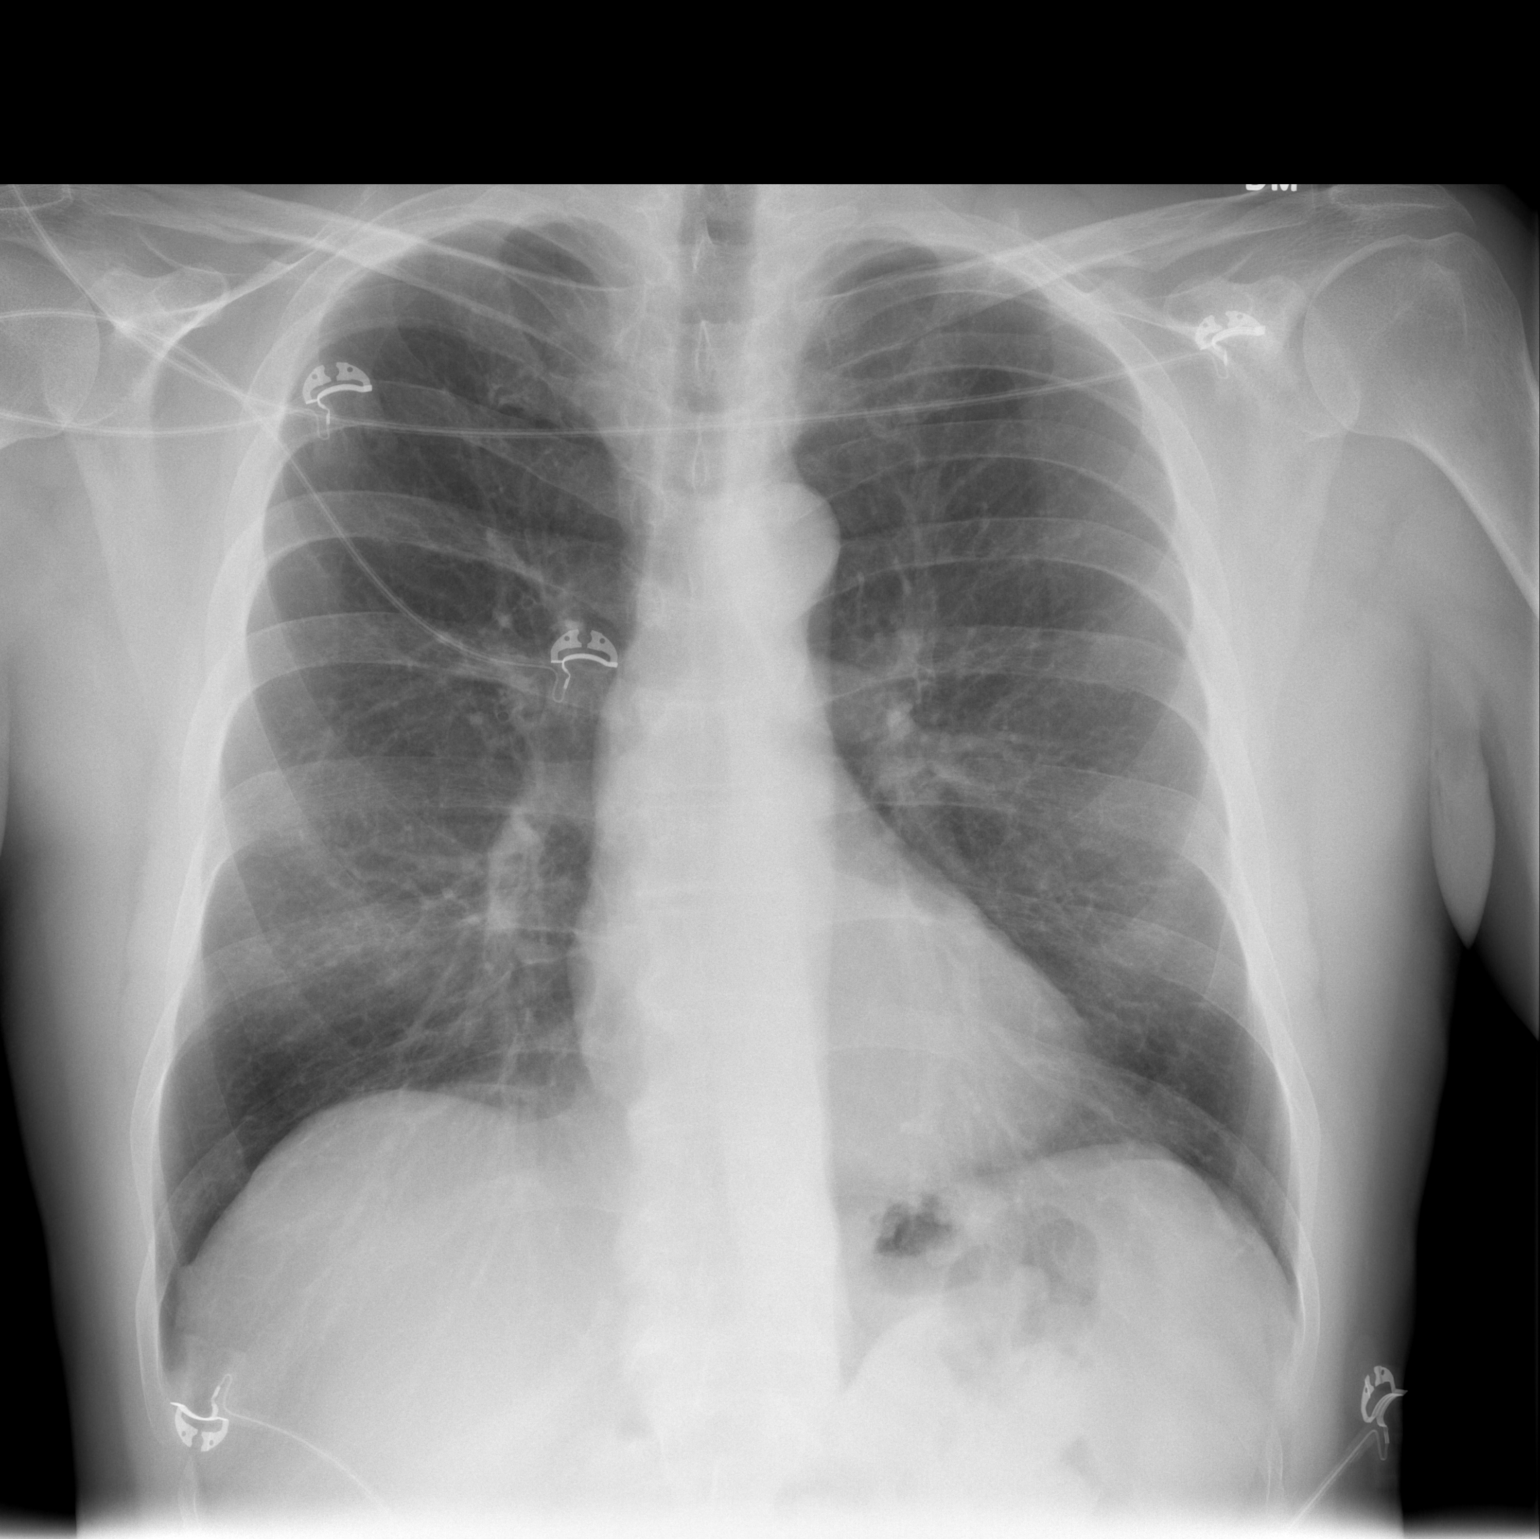

[w chest lat]
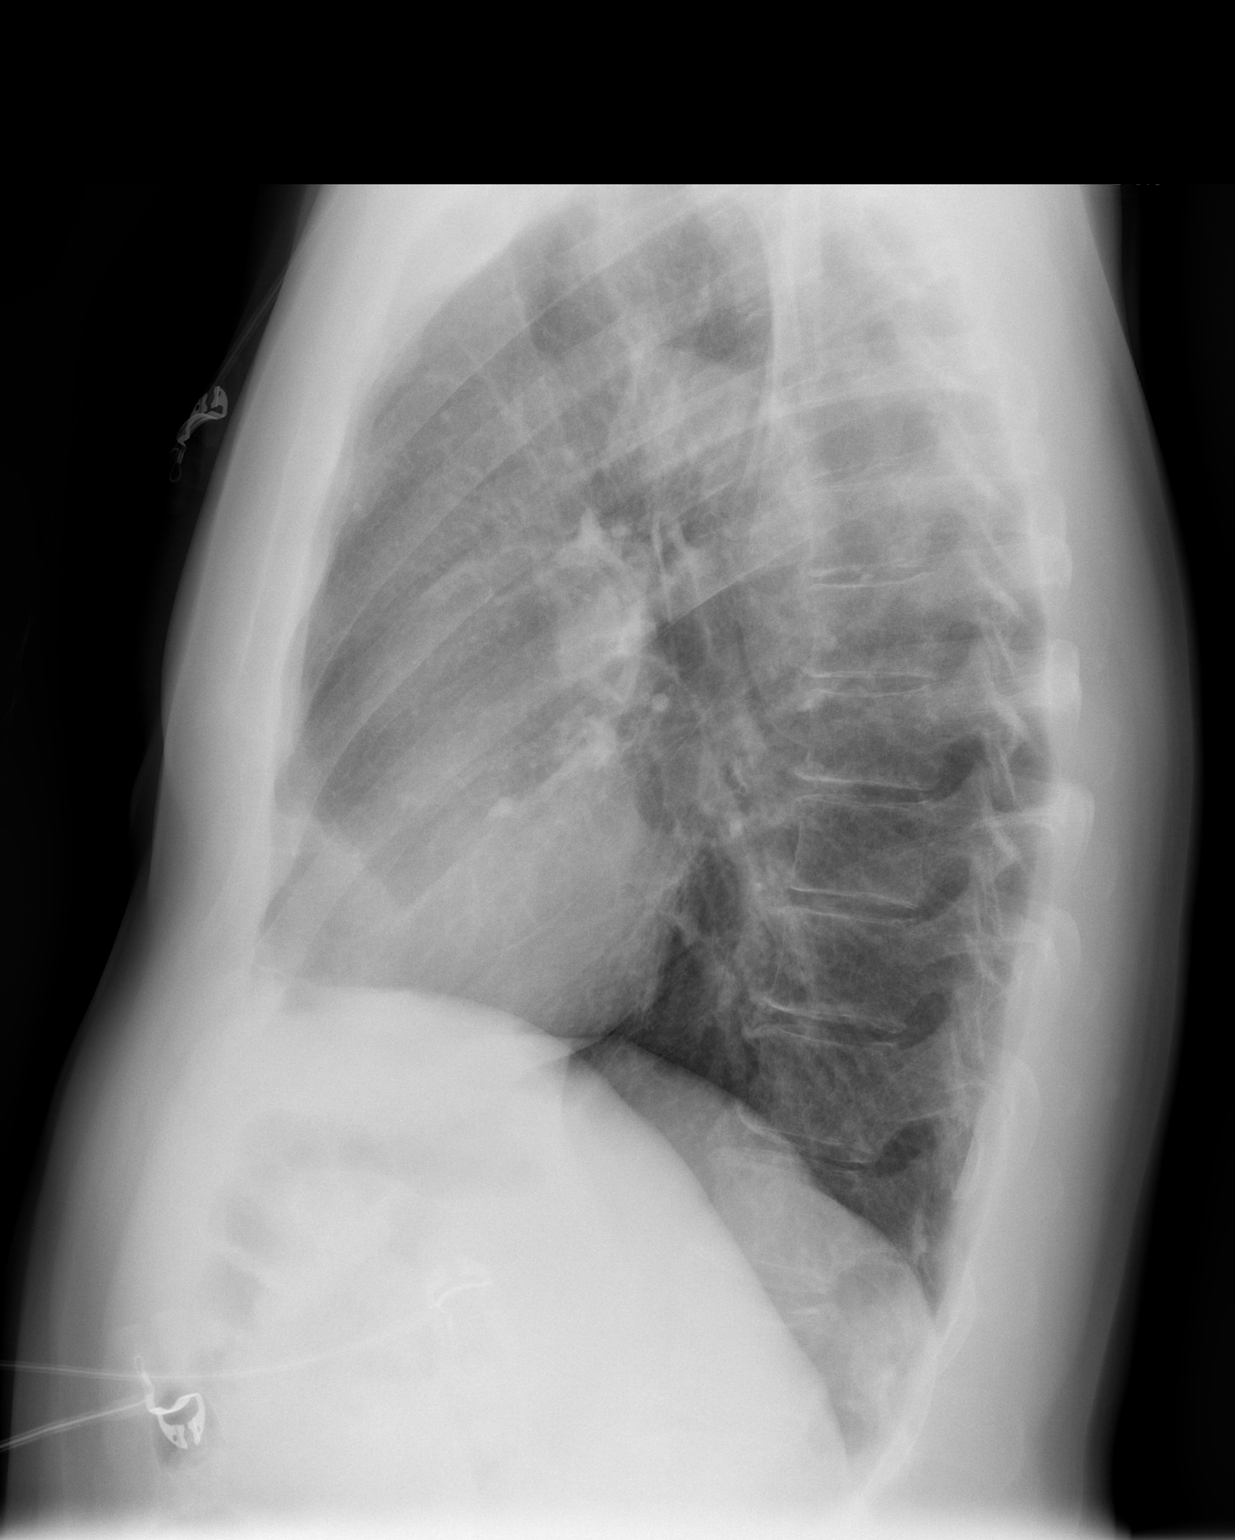

[2 of 2 positions shown; findings below may reference images not displayed]

FINDINGS: Lungs are clear without infiltrate or effusion.  Negative
for heart failure or mass lesion.  No change from the prior study.
IMPRESSION: No acute cardiopulmonary abnormality.

## 2014-09-19 IMAGING — US US ABDOMEN COMPLETE
1 series · 13 of 25 positions shown · non-contrast
Comparison: No priors.

CLINICAL DATA: Epigastric pain.

COMPLETE ABDOMINAL ULTRASOUND

[Series 1: us abdomen complete · 0.28mm/px · 13 of 68 slices shown]
[im 1/68]
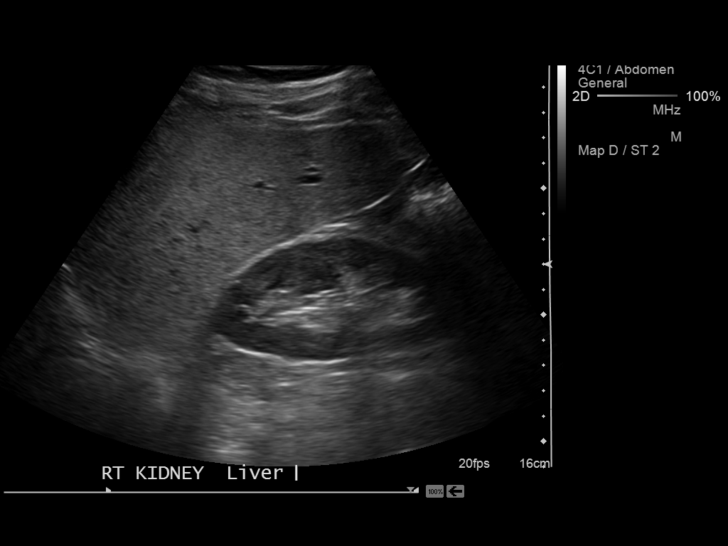
[im 6/68]
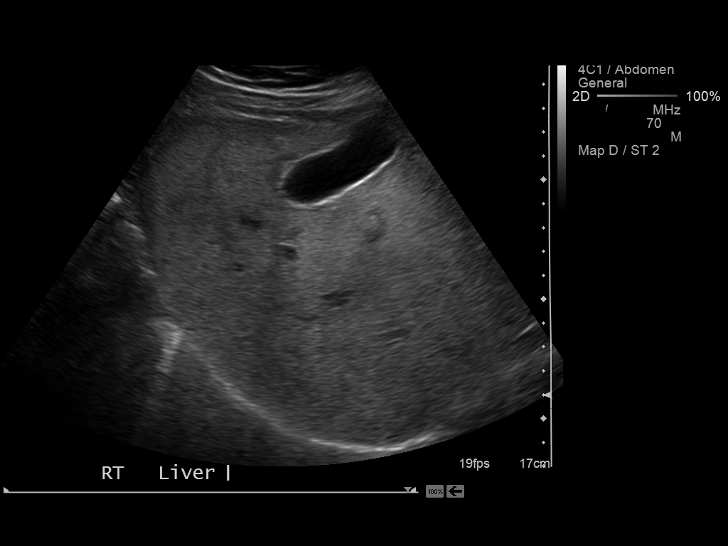
[im 12/68]
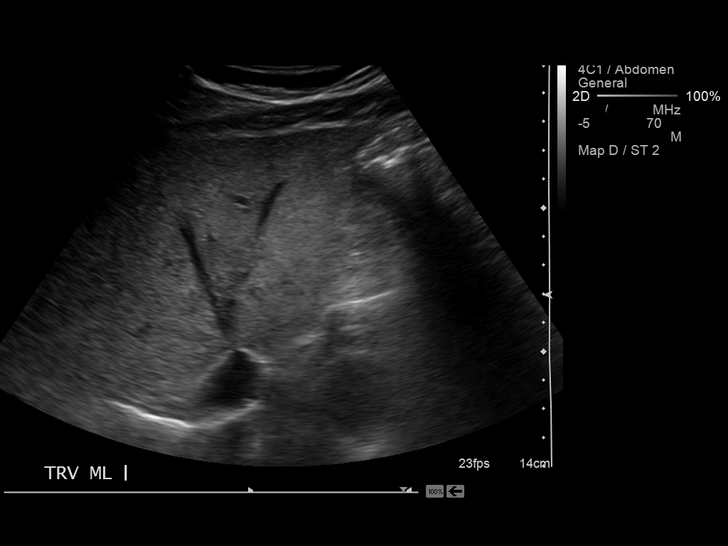
[im 17/68]
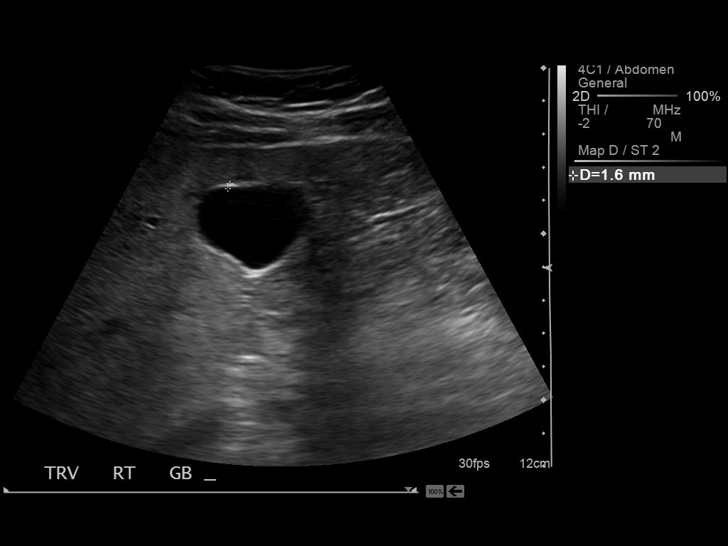
[im 23/68]
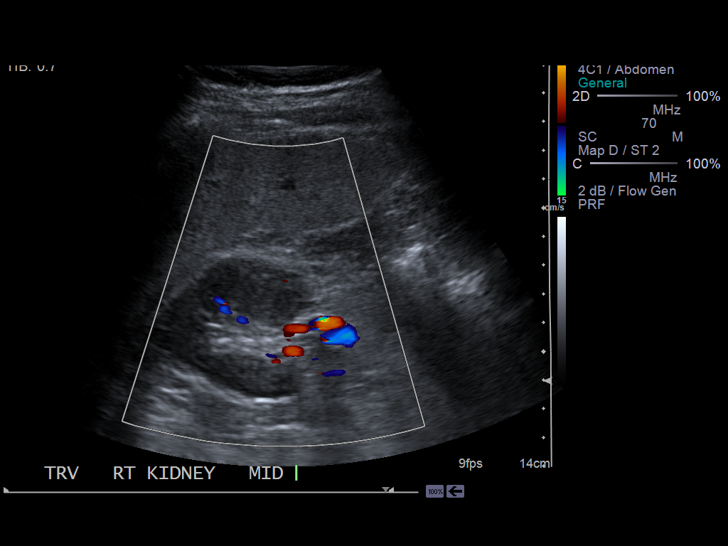
[im 28/68]
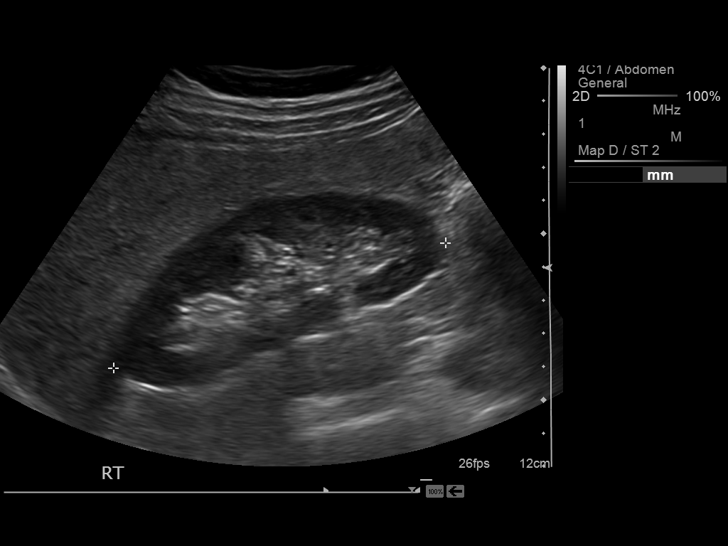
[im 34/68]
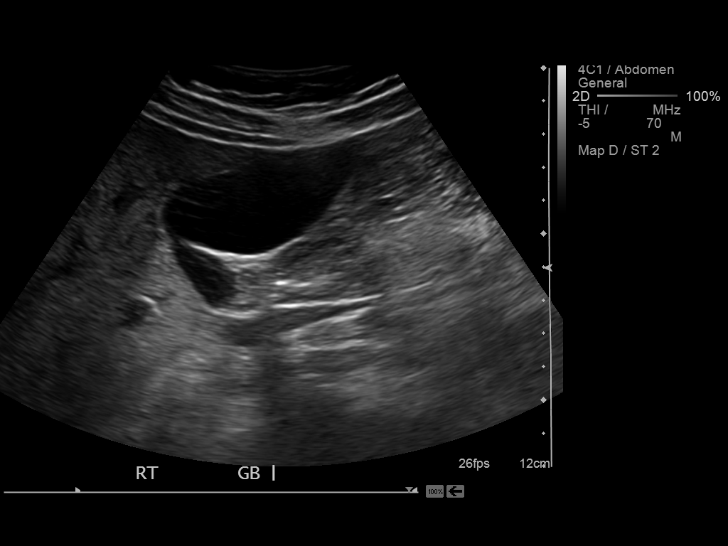
[im 40/68]
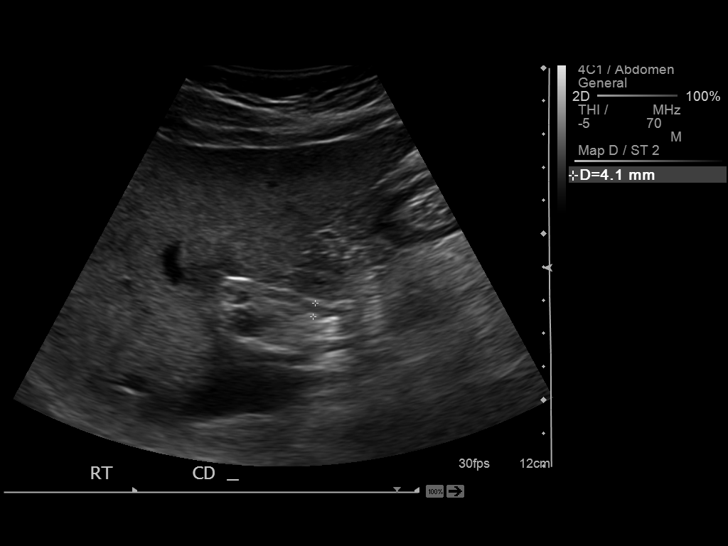
[im 45/68]
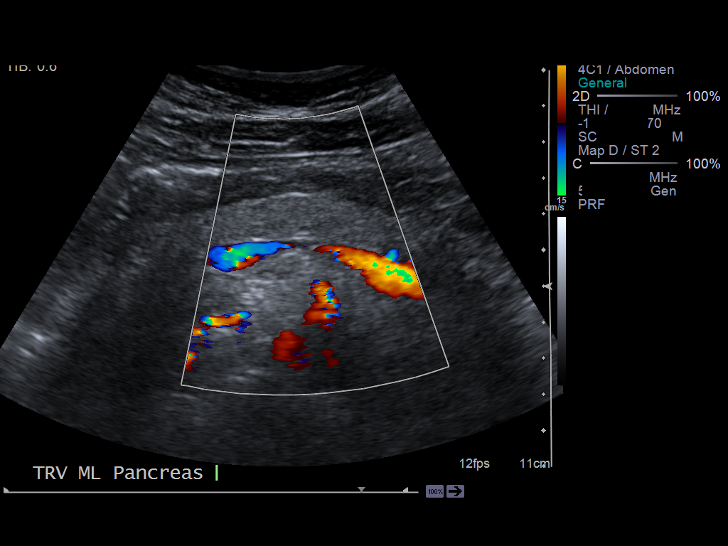
[im 51/68]
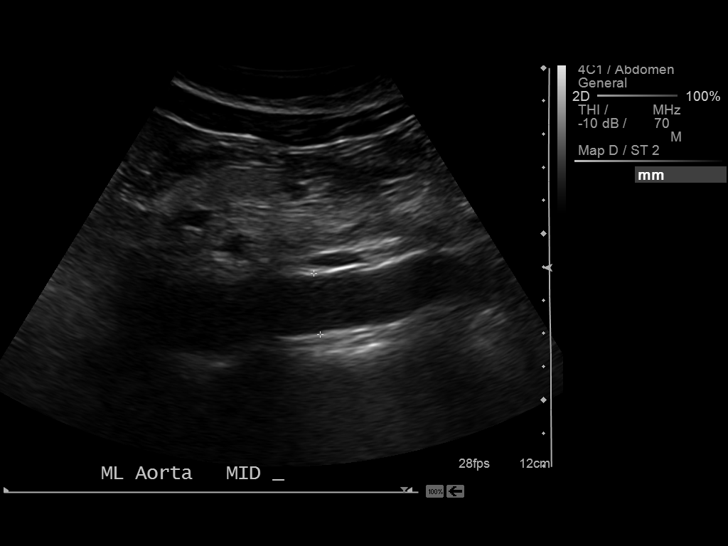
[im 56/68]
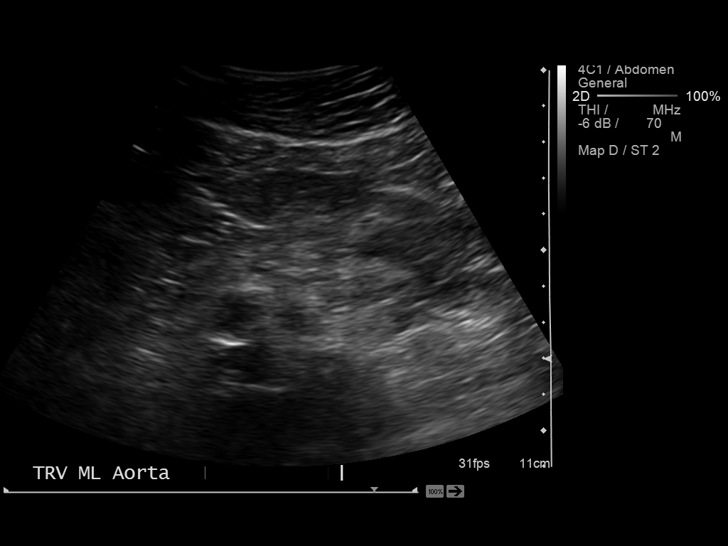
[im 62/68]
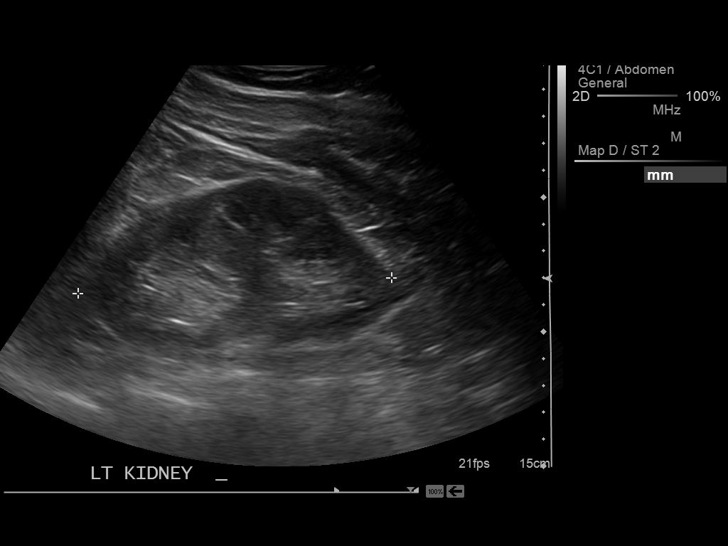
[im 68/68]
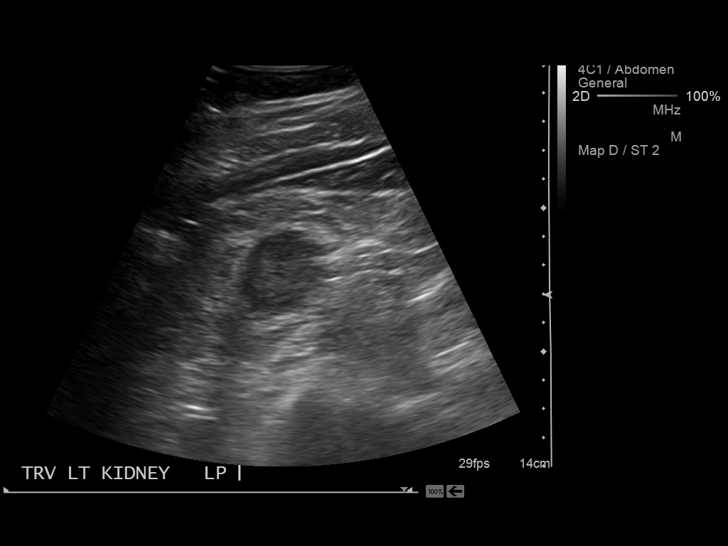

[13 of 25 positions shown; findings below may reference images not displayed]

FINDINGS: Gallbladder:  No shadowing gallstones or echogenic sludge.  No
gallbladder wall thickening or pericholecystic fluid.  Negative
sonographic Murphy's sign according to the ultrasound technologist.

Common bile duct:  Normal caliber measuring 4 mm in the porta
hepatis.

Liver:  Heterogeneously hyperechoic, suggestive of multifocal areas
of fatty infiltration.  No definite focal cystic or solid hepatic
lesions.  No intrahepatic biliary ductal dilatation.  Normal
hepatopetal flow in the portal vein.

IVC:  Patent throughout its visualized course in the abdomen.

Pancreas:  Although the pancreas is difficult to visualize in its
entirety, no focal pancreatic abnormality is identified.

Spleen:  Normal size and echotexture without focal parenchymal
abnormality.6.7 cm in length.

Right Kidney:  No hydronephrosis.  Well-preserved cortex.  Normal
size and parenchymal echotexture without focal abnormalities.
cm in length.

Left Kidney:  No hydronephrosis.  Well-preserved cortex.  Normal
size and parenchymal echotexture without focal abnormalities.
cm in length.

Abdominal aorta:  Measures up to 2.2 cm in diameter proximally,
tapers appropriately distally.
IMPRESSION: Negative abdominal ultrasound.

## 2015-03-07 ENCOUNTER — Observation Stay (HOSPITAL_BASED_OUTPATIENT_CLINIC_OR_DEPARTMENT_OTHER)
Admission: EM | Admit: 2015-03-07 | Discharge: 2015-03-08 | Disposition: A | Discharge status: Home / Self Care | Payer: BC Managed Care – PPO | Attending: Family Medicine | Admitting: Family Medicine

## 2015-03-07 ENCOUNTER — Emergency Department (HOSPITAL_BASED_OUTPATIENT_CLINIC_OR_DEPARTMENT_OTHER): Payer: BC Managed Care – PPO

## 2015-03-07 ENCOUNTER — Encounter (HOSPITAL_BASED_OUTPATIENT_CLINIC_OR_DEPARTMENT_OTHER): Payer: Self-pay | Admitting: Emergency Medicine

## 2015-03-07 DIAGNOSIS — F3160 Bipolar disorder, current episode mixed, unspecified: Secondary | ICD-10-CM | POA: Diagnosis present

## 2015-03-07 DIAGNOSIS — R072 Precordial pain: Secondary | ICD-10-CM | POA: Diagnosis present

## 2015-03-07 DIAGNOSIS — F419 Anxiety disorder, unspecified: Secondary | ICD-10-CM

## 2015-03-07 DIAGNOSIS — R0789 Other chest pain: Principal | ICD-10-CM | POA: Insufficient documentation

## 2015-03-07 DIAGNOSIS — R079 Chest pain, unspecified: Secondary | ICD-10-CM

## 2015-03-07 DIAGNOSIS — R1310 Dysphagia, unspecified: Secondary | ICD-10-CM

## 2015-03-07 DIAGNOSIS — K200 Eosinophilic esophagitis: Secondary | ICD-10-CM | POA: Diagnosis not present

## 2015-03-07 DIAGNOSIS — M109 Gout, unspecified: Secondary | ICD-10-CM | POA: Diagnosis present

## 2015-03-07 DIAGNOSIS — M797 Fibromyalgia: Secondary | ICD-10-CM | POA: Diagnosis present

## 2015-03-07 LAB — COMPREHENSIVE METABOLIC PANEL
ALT: 17 U/L (ref 17–63)
AST: 25 U/L (ref 15–41)
Albumin: 4.5 g/dL (ref 3.5–5.0)
Alkaline Phosphatase: 54 U/L (ref 38–126)
Anion gap: 7 (ref 5–15)
BUN: 21 mg/dL — ABNORMAL HIGH (ref 6–20)
CO2: 28 mmol/L (ref 22–32)
Calcium: 9.8 mg/dL (ref 8.9–10.3)
Chloride: 106 mmol/L (ref 101–111)
Creatinine, Ser: 1.08 mg/dL (ref 0.61–1.24)
GFR calc Af Amer: >60 mL/min (ref >60)
GFR calc non Af Amer: >60 mL/min (ref >60)
Glucose, Bld: 101 mg/dL — ABNORMAL HIGH (ref 65–99)
Potassium: 4.4 mmol/L (ref 3.5–5.1)
Sodium: 141 mmol/L (ref 135–145)
Total Bilirubin: 0.6 mg/dL (ref 0.3–1.2)
Total Protein: 8 g/dL (ref 6.5–8.1)

## 2015-03-07 LAB — CBC
HCT: 42.7 % (ref 39.0–52.0)
HCT: 47.7 % (ref 39.0–52.0)
Hemoglobin: 14.1 g/dL (ref 13.0–17.0)
Hemoglobin: 16 g/dL (ref 13.0–17.0)
MCH: 30.9 pg (ref 26.0–34.0)
MCH: 31.5 pg (ref 26.0–34.0)
MCHC: 33 g/dL (ref 30.0–36.0)
MCHC: 33.5 g/dL (ref 30.0–36.0)
MCV: 93.4 fL (ref 78.0–100.0)
MCV: 93.9 fL (ref 78.0–100.0)
Platelets: 219 10*3/uL (ref 150–400)
Platelets: 227 10*3/uL (ref 150–400)
RBC: 4.57 MIL/uL (ref 4.22–5.81)
RBC: 5.08 MIL/uL (ref 4.22–5.81)
RDW: 13 % (ref 11.5–15.5)
RDW: 13.1 % (ref 11.5–15.5)
WBC: 10.1 10*3/uL (ref 4.0–10.5)
WBC: 9.3 10*3/uL (ref 4.0–10.5)

## 2015-03-07 LAB — CREATININE, SERUM
Creatinine, Ser: 0.99 mg/dL (ref 0.61–1.24)
GFR calc Af Amer: >60 mL/min (ref >60)
GFR calc non Af Amer: >60 mL/min (ref >60)

## 2015-03-07 LAB — TROPONIN I: Troponin I: <0.03 ng/mL (ref <0.031)

## 2015-03-07 MED ORDER — MORPHINE SULFATE 2 MG/ML IJ SOLN
1.0000 mg | INTRAMUSCULAR | Status: DC | PRN
  Administered 2015-03-07 – 2015-03-08 (×3): 1 mg via INTRAVENOUS
  Filled 2015-03-07 (×3): qty 1

## 2015-03-07 MED ORDER — ASPIRIN 81 MG PO CHEW
324.0000 mg | CHEWABLE_TABLET | Freq: Once | ORAL | Status: AC
  Administered 2015-03-07: 324 mg via ORAL
  Filled 2015-03-07: qty 4

## 2015-03-07 MED ORDER — SODIUM CHLORIDE 0.9 % IV SOLN
INTRAVENOUS | Status: DC
  Administered 2015-03-07: 19:00:00 via INTRAVENOUS

## 2015-03-07 MED ORDER — BUDESONIDE 3 MG PO CPEP
3.0000 mg | ORAL_CAPSULE | Freq: Every day | ORAL | Status: DC
  Administered 2015-03-07 – 2015-03-08 (×2): 3 mg via ORAL
  Filled 2015-03-07 (×2): qty 1

## 2015-03-07 MED ORDER — ACETAMINOPHEN 325 MG PO TABS
650.0000 mg | ORAL_TABLET | ORAL | Status: DC | PRN
  Administered 2015-03-08: 650 mg via ORAL
  Filled 2015-03-07: qty 2

## 2015-03-07 MED ORDER — MORPHINE SULFATE 2 MG/ML IJ SOLN
2.0000 mg | Freq: Once | INTRAMUSCULAR | Status: AC
  Administered 2015-03-07: 2 mg via INTRAVENOUS
  Filled 2015-03-07: qty 1

## 2015-03-07 MED ORDER — ENOXAPARIN SODIUM 40 MG/0.4ML ~~LOC~~ SOLN: 40.0000 mg | SUBCUTANEOUS | Status: DC

## 2015-03-07 MED ORDER — PROMETHAZINE HCL 25 MG/ML IJ SOLN
12.5000 mg | Freq: Once | INTRAMUSCULAR | Status: AC
  Administered 2015-03-07: 12.5 mg via INTRAVENOUS
  Filled 2015-03-07: qty 1

## 2015-03-07 MED ORDER — ZOLPIDEM TARTRATE 5 MG PO TABS: 10.0000 mg | ORAL_TABLET | Freq: Every evening | ORAL | Status: DC | PRN

## 2015-03-07 MED ORDER — CHLORDIAZEPOXIDE HCL 25 MG PO CAPS: 25.0000 mg | ORAL_CAPSULE | Freq: Three times a day (TID) | ORAL | Status: DC | PRN

## 2015-03-07 MED ORDER — PANTOPRAZOLE SODIUM 40 MG PO TBEC
40.0000 mg | DELAYED_RELEASE_TABLET | Freq: Two times a day (BID) | ORAL | Status: DC
  Administered 2015-03-07 – 2015-03-08 (×2): 40 mg via ORAL
  Filled 2015-03-07 (×2): qty 1

## 2015-03-07 MED ORDER — ALBUTEROL SULFATE HFA 108 (90 BASE) MCG/ACT IN AERS: 2.0000 | INHALATION_SPRAY | Freq: Every day | RESPIRATORY_TRACT | Status: DC | PRN

## 2015-03-07 MED ORDER — ONDANSETRON HCL 4 MG PO TABS: 4.0000 mg | ORAL_TABLET | Freq: Three times a day (TID) | ORAL | Status: DC | PRN

## 2015-03-07 MED ORDER — NITROGLYCERIN 0.4 MG SL SUBL: 0.4000 mg | SUBLINGUAL_TABLET | SUBLINGUAL | Status: DC | PRN

## 2015-03-07 MED ORDER — ONDANSETRON HCL 4 MG/2ML IJ SOLN: 4.0000 mg | Freq: Four times a day (QID) | INTRAMUSCULAR | Status: DC | PRN

## 2015-03-07 MED ORDER — GUAIFENESIN ER 600 MG PO TB12: 600.0000 mg | ORAL_TABLET | Freq: Every evening | ORAL | Status: DC | PRN

## 2015-03-07 MED ORDER — TRAZODONE HCL 50 MG PO TABS
50.0000 mg | ORAL_TABLET | Freq: Every day | ORAL | Status: DC
  Administered 2015-03-07: 50 mg via ORAL
  Filled 2015-03-07: qty 1

## 2015-03-07 MED ORDER — GI COCKTAIL ~~LOC~~
30.0000 mL | Freq: Once | ORAL | Status: AC
  Administered 2015-03-07: 30 mL via ORAL
  Filled 2015-03-07: qty 30

## 2015-03-07 MED ORDER — ALBUTEROL SULFATE (2.5 MG/3ML) 0.083% IN NEBU: 2.5000 mg | INHALATION_SOLUTION | Freq: Every day | RESPIRATORY_TRACT | Status: DC | PRN

## 2015-03-07 NOTE — ED Notes (Signed)
Pt having chest pain since 0730 this am.  Centralized, non radiating.  Pt states he was outside walking and started having the pain along with dizziness, diaphoresis, nausea, slight headache and some sob.  Pt has recently been changing his medications from klonopin to librium and weaning off of them.

## 2015-03-07 NOTE — H&P (Signed)
[facility] History and Physical  Larnce Schnackenberg XBM:[id number] DOB: 1955/12/31 DOA: 03/07/2015  Referring physician: ED PCP: [name], MD  Specialists: [facility]  Chief Complaint:   59 y/o ? eosinophilic esophagitis + Hepatitis C diag 1991 Rx Harvoni Rx Dr. Rushie Goltz admissions 11/20/12 for Low chest /epig pain, h/o reported fibromyalgia, Bipolar prior follwed Dr. Andi Devon started on taper from Klonopin-->Librium and has been taking less of this recently, ongoing TOb abuse half pack per day, prior hematemesis 2007, current daily drinker 6-7 drinks of vodka at a time, prior suicidality?, Concern for Drug seeking behaviour in the past He also has a history of gout which started to flare on 03/01/15. He took Naprosyn for this and felt some relief. Yesterday he felt a little uncomfortable and took some Naprosyn 400 mg at night. 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 Between 7 and 7:30 AM he claims he had nausea and chest pain and felt woozy and lightheaded for about 10-15 seconds. He did not black out He did not have any blurred or double vision or any unilateral weakness. He states that his appetite has been decreased as it usually is in the summer because of the heat  It is noted that on the [facility] the patient is not taking his pantoprazole daily but instead taking it when necessary when necessary  - dysuria, - hematemesis, - dark stool, - fever, - chills, - cough, - cold, - rhinorrhea, - rash, - other issues  Family history-mother passed from lung cancer age 58 Father passed from heart failure at age 3-history of CAD at age 43, history of multiple CVAs  No known drug allergies other than prednisone and tetanus  Social history married 3 children Smokes half pack per day Drinks 6-7 vodka drinks every 1-2 weeks   Past Medical History   Diagnosis Date  . Neck pain   . Anxiety   . Esophageal reflux     eosinophil esophagitis  . Acute hepatitis C without mention of hepatic coma     Tx by [facility] for Hep.  negative for hep c 2/15.  . Colitis    Past Surgical History  Procedure Laterality Date  . Shoulder surgery     Social History:  History   Social History Narrative   Patient lives at home with his spouse.   Caffeine Use: yes    Allergies  Allergen Reactions  . Prednisone Other (See Comments)    States that this med makes him "crazy"  . Tetanus Toxoids Swelling and Other (See Comments)    Fever, Swelling of the arm   . Wellbutrin [Bupropion] Other (See Comments)    Crazy thoughts, nightmares    Family History  Problem Relation Age of Onset  . Autoimmune disease Neg Hx   . Lung cancer Mother   . Heart Problems Father     Prior to Admission medications   Medication Sig Start Date End Date Taking? Authorizing Provider  chlordiazePOXIDE (LIBRIUM) 25 MG capsule Take 12.5-25 mg by mouth 3 (three) times daily as needed for anxiety.    Yes Historical Provider, MD  guaiFENesin (MUCINEX) 600 MG 12 hr tablet Take 600 mg by mouth at bedtime as needed for cough or to loosen phlegm.   Yes Historical Provider, MD  Multiple Vitamins-Minerals (ONE-A-DAY MENS 50+ ADVANTAGE PO) Take 2 tablets by mouth daily.  Yes Historical Provider, MD  traZODone (DESYREL) 50 MG tablet Take 50 mg by mouth at bedtime.    Yes Historical Provider, MD  VENTOLIN HFA 108 (90 BASE) MCG/ACT inhaler Inhale 2 puffs into the lungs daily as needed for shortness of breath.  01/03/15  Yes Historical Provider, MD  zolpidem (AMBIEN) 10 MG tablet Take 10 mg by mouth at bedtime as needed for sleep.  03/04/15  Yes Historical Provider, MD  ondansetron (ZOFRAN) 4 MG tablet Take 1 tablet (4 mg total) by mouth every 8 (eight) hours as needed for nausea or vomiting. 03/07/15   York Ram Melancon, MD  pantoprazole (PROTONIX) 20 MG tablet Take 2 tablets (40 mg total) by  mouth daily. Patient taking differently: Take 40 mg by mouth daily as needed for heartburn.  05/13/14   Linton Flemings, MD   Physical Exam: Filed Vitals:   03/07/15 1330 03/07/15 1430 03/07/15 1536 03/07/15 1654  BP: 147/86 135/80 115/98 142/67  Pulse: 80 74 70 62  Temp:    98.2 F (36.8 C)  TempSrc:    Oral  Resp: '17 22 16 18  '$ Height:    '5\' 10"'$  (1.778 m)  Weight:    83.3 kg (183 lb 10.3 oz)  SpO2: 99% 98% 98% 100%    EOMI, NCAT  NO ict nor pallor s1 s2 no m/r/g Chest clear abd soft but point epigastric tenderness and viisble wincing on light pressure Rectal deferred' \\Power'$    Labs on Admission:  Basic Metabolic Panel:  Recent Labs Lab 03/07/15 1120  NA 141  K 4.4  CL 106  CO2 28  GLUCOSE 101*  BUN 21*  CREATININE 1.08  CALCIUM 9.8   Liver Function Tests:  Recent Labs Lab 03/07/15 1120  AST 25  ALT 17  ALKPHOS 54  BILITOT 0.6  PROT 8.0  ALBUMIN 4.5   No results for input(s): LIPASE, AMYLASE in the last 168 hours. No results for input(s): AMMONIA in the last 168 hours. CBC:  Recent Labs Lab 03/07/15 1120  WBC 10.1  HGB 14.1  HCT 42.7  MCV 93.4  PLT 219   Cardiac Enzymes:  Recent Labs Lab 03/07/15 1120  TROPONINI <0.03    BNP (last 3 results) No results for input(s): BNP in the last 8760 hours.  ProBNP (last 3 results) No results for input(s): PROBNP in the last 8760 hours.  CBG: No results for input(s): GLUCAP in the last 168 hours.  Radiological Exams on Admission: Dg Chest Port 1 View  03/07/2015   CLINICAL DATA:  Chest pain starting this morning  EXAM: PORTABLE CHEST - 1 VIEW  COMPARISON:  01/03/2015  FINDINGS: Cardiomediastinal silhouette is stable. No acute infiltrate or pleural effusion. No pulmonary edema. There is a lytic lesion in right humeral neck with some cortical thinning. The lesion measures at least 4.1 by 3 cm. No aggressive features are noted. There is no cortical destruction. No pathologic fracture. The lesion may  represent aneurysmal bone cyst. Further correlation with MRI is recommended as clinically warranted.  IMPRESSION: No active disease. Lytic lesion in right humeral neck without aggressive features measures about 4.1 x 3 cm. 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 Further evaluation with MRI is recommended as clinically warranted.   Electronically Signed   By: Lahoma Crocker M.D.   On: 03/07/2015 11:38    EKG: Independently reviewed. Sinus tachycardia PR interval 0.12 QRS axis 45 ST-T wave peaking across precordium likely rate related as he had a rate of about 100  no significant change from prior EKG  Assessment/Plan Principal Problem:   Eosinophilic esophagitis-patient presented with chest pain from urgent care center[facility] [place] and has a heart score less than 4. He clearly has dysphagia as well as epigastric tenderness ruling out cardiac etiology His chest pain is not suspicious in my mind for any type of cardiac event Nonetheless we will cycle EKG and troponins overnight He will need oral fluticasone 220 g twice a day but this is not available so we will substitute oral budesonide 2 g daily He will need to be stressed on discharge that we do not or should not treat his gout with NSAIDs as he has a predisposing condition that may worsen because of this I have very low suspicion to think that he has an alternative diagnosis light aortic dissection or pulmonary embolism as he is a relatively active guy He tells me that he walks up 12 flights of stairs on a daily basis for exercise because some of his work is desk bound He did this yesterday so effectively if he didn't have any pain or chest pain at that time this is unlikely to represent angina  Active Problems:   Fibromyalgia-outpatient evaluation   Hepatitis C status post treatment-follow-up with gastroenterologist as an outpatient-he allegedly contracted this either sexually or after surgery when he was 60 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 He does not appear to have severe pain at this time   Mixed bipolar I disorder-patient was recently tapered off of Klonopin and switched to Librium by his psychologist/psychiatrist. I would recommend he continue with the plan moving forward Continued Smoking-I've counseled him regarding cessation as there is a high mortality from this    85 minutes Discussed extensively with the patient Likely observation and discharge in a.m.    [name] [facility] Pager [phone]  If 7PM-7AM, please contact night-coverage [email or web address] Password [facility] 03/07/2015, 6:02 PM

## 2015-03-07 NOTE — Discharge Instructions (Signed)
Chest Pain (Nonspecific) °It is often hard to give a diagnosis for the cause of chest pain. There is always a chance that your pain could be related to something serious, such as a heart attack or a blood clot in the lungs. You need to follow up with your doctor. °HOME CARE °· If antibiotic medicine was given, take it as directed by your doctor. Finish the medicine even if you start to feel better. °· For the next few days, avoid activities that bring on chest pain. Continue physical activities as told by your doctor. °· Do not use any tobacco products. This includes cigarettes, chewing tobacco, and e-cigarettes. °· Avoid drinking alcohol. °· Only take medicine as told by your doctor. °· Follow your doctor's suggestions for more testing if your chest pain does not go away. °· Keep all doctor visits you made. °GET HELP IF: °· Your chest pain does not go away, even after treatment. °· You have a rash with blisters on your chest. °· You have a fever. °GET HELP RIGHT AWAY IF:  °· You have more pain or pain that spreads to your arm, neck, jaw, back, or belly (abdomen). °· You have shortness of breath. °· You cough more than usual or cough up blood. °· You have very bad back or belly pain. °· You feel sick to your stomach (nauseous) or throw up (vomit). °· You have very bad weakness. °· You pass out (faint). °· You have chills. °This is an emergency. Do not wait to see if the problems will go away. Call your local emergency services (911 in [place] Do not drive yourself to the hospital. °MAKE SURE YOU:  °· Understand these instructions. °· Will watch your condition. °· Will get help right away if you are not doing well or get worse. °Document Released: 02/02/2008 Document Revised: 08/21/2013 Document Reviewed: 02/02/2008 °ExitCare® Patient Information ©2015 [employer]. This information is not intended to replace advice given to you by your health care provider. Make sure you discuss any questions you have with your  health care provider. ° °Chest Wall Pain °Chest wall pain is pain in or around the bones and muscles of your chest. It may take up to 6 weeks to get better. It may take longer if you must stay physically active in your work and activities.  °CAUSES  °Chest wall pain may happen on its own. However, it may be caused by: °· A viral illness like the flu. °· Injury. °· Coughing. °· Exercise. °· Arthritis. °· Fibromyalgia. °· Shingles. °HOME CARE INSTRUCTIONS  °· Avoid overtiring physical activity. Try not to strain or perform activities that cause pain. This includes any activities using your chest or your abdominal and side muscles, especially if heavy weights are used. °· Put ice on the sore area. °¨ Put ice in a plastic bag. °¨ Place a towel between your skin and the bag. °¨ Leave the ice on for 15-20 minutes per hour while awake for the first 2 days. °· Only take over-the-counter or prescription medicines for pain, discomfort, or fever as directed by your caregiver. °SEEK IMMEDIATE MEDICAL CARE IF:  °· Your pain increases, or you are very uncomfortable. °· You have a fever. °· Your chest pain becomes worse. °· You have new, unexplained symptoms. °· You have nausea or vomiting. °· You feel sweaty or lightheaded. °· You have a cough with phlegm (sputum), or you cough up blood. °MAKE SURE YOU:  °· Understand these instructions. °· Will watch your   condition. °· Will get help right away if you are not doing well or get worse. °Document Released: 08/16/2005 Document Revised: 11/08/2011 Document Reviewed: 04/12/2011 °ExitCare® Patient Information ©2015 [employer]. This information is not intended to replace advice given to you by your health care provider. Make sure you discuss any questions you have with your health care provider. ° °

## 2015-03-07 NOTE — ED Provider Notes (Signed)
CSN: [id number]     Arrival date & time 03/07/15  1025 History   First MD Initiated Contact with Patient 03/07/15 1049     Chief Complaint  Patient presents with  . Chest Pain     (Consider location/radiation/quality/duration/timing/severity/associated sxs/prior Treatment) HPI Comments: 59 y/o M with hx of Eosinophilic Esophagitis, Hep C s/p Harvoni, Anxiety on Librium wean, presents this am after moderate, sharp, left chest pain that has been constant for the last several hours, mild SOB, and lightheadedness from work. 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 He denies diaphoresis, vomiting, he does complain of mild nausea. He says the pain is constant and waxing / waning in intensity. He tried maalox at home due to his history of esophagitis, and it did not help much. He has felt mildly short of breath today as well. His father had a heart attack at 51, but otherwise no significant family history of early cardiac death. He does not have angina at baseline.   Patient is a 59 y.o. male presenting with chest pain. The history is provided by the patient.  Chest Pain Pain location:  Substernal area and L lateral chest Pain quality: aching, pressure, sharp and tightness   Pain radiates to:  Does not radiate Pain radiates to the back: no   Pain severity:  Moderate Onset quality:  Gradual Duration:  5 hours Timing:  Constant Progression:  Waxing and waning Chronicity:  New Context: at rest and stress   Context: not breathing, no drug use, not eating, no intercourse and no trauma   Relieved by:  None tried Worsened by:  Nothing tried Ineffective treatments:  Antacids Associated symptoms: anxiety and shortness of breath   Associated symptoms: no abdominal pain, no AICD problem, no altered mental status, no back pain, no claudication, no cough, no diaphoresis, no dizziness, no dysphagia, no fatigue, no fever, no headache, no heartburn, no lower extremity edema, no  nausea, no near-syncope, no numbness, no orthopnea, no palpitations, no PND, no syncope, not vomiting and no weakness   Risk factors: male sex and smoking     Past Medical History  Diagnosis Date  . Neck pain   . Anxiety   . Esophageal reflux     eosinophil esophagitis  . Acute hepatitis C without mention of hepatic coma     Tx by [facility] for Hep.  negative for hep c 2/15.  . Colitis    Past Surgical History  Procedure Laterality Date  . Shoulder surgery     Family History  Problem Relation Age of Onset  . Autoimmune disease Neg Hx   . Lung cancer Mother   . Heart Problems Father    History  Substance Use Topics  . Smoking status: Current Every Day Smoker -- 0.50 packs/day    Types: Cigarettes  . Smokeless tobacco: Never Used  . Alcohol Use: No    Review of Systems  Constitutional: Negative.  Negative for fever, diaphoresis and fatigue.  HENT: Negative for trouble swallowing.   Respiratory: Positive for shortness of breath. Negative for cough.   Cardiovascular: Positive for chest pain. Negative for palpitations, orthopnea, claudication, syncope, PND and near-syncope.  Gastrointestinal: Negative for heartburn, nausea, vomiting and abdominal pain.  Musculoskeletal: Negative for back pain.  Neurological: Negative for dizziness, weakness, numbness and headaches.      Allergies  Prednisone and Tetanus toxoids  Home Medications   Prior to Admission medications   Medication Sig Start  Date End Date Taking? Authorizing Provider  chlordiazePOXIDE (LIBRIUM) 25 MG capsule Take 25 mg by mouth 2 (two) times daily before a meal.   Yes Historical Provider, MD  traZODone (DESYREL) 50 MG tablet Take 50 mg by mouth at bedtime as needed for sleep.   Yes Historical Provider, MD  Multiple Vitamins-Minerals (ONE-A-DAY MENS 50+ ADVANTAGE PO) Take 1 tablet by mouth daily.    Historical Provider, MD  ondansetron (ZOFRAN) 4 MG tablet Take 1 tablet (4 mg total) by mouth every 8 (eight) hours  as needed for nausea or vomiting. 03/07/15   York Ram Chandrika Sandles, MD  pantoprazole (PROTONIX) 20 MG tablet Take 2 tablets (40 mg total) by mouth daily. Patient taking differently: Take 40 mg by mouth daily as needed for heartburn.  05/13/14   Linton Flemings, MD   BP 133/77 mmHg  Pulse 72  Temp(Src) 98.8 F (37.1 C) (Oral)  Resp 15  Ht '5\' 9"'$  (1.753 m)  Wt 190 lb (86.183 kg)  BMI 28.05 kg/m2  SpO2 97% Physical Exam  Constitutional: He is oriented to person, place, and time. He appears well-developed and well-nourished. No distress.  HENT:  Head: Normocephalic and atraumatic.  Mouth/Throat: Oropharynx is clear and moist.  Eyes: Conjunctivae and EOM are normal. Pupils are equal, round, and reactive to light.  Neck: Normal range of motion. Neck supple.  Cardiovascular: Normal rate, regular rhythm, normal heart sounds and intact distal pulses.  PMI is not displaced.  Exam reveals no gallop and no friction rub.   No murmur heard. Pulses:      Carotid pulses are 2+ on the right side, and 2+ on the left side.      Radial pulses are 2+ on the right side, and 2+ on the left side.       Femoral pulses are 2+ on the right side, and 2+ on the left side.      Popliteal pulses are 2+ on the right side, and 2+ on the left side.       Dorsalis pedis pulses are 2+ on the right side, and 2+ on the left side.       Posterior tibial pulses are 2+ on the right side, and 2+ on the left side.  Pulmonary/Chest: Effort normal and breath sounds normal. No respiratory distress. He has no decreased breath sounds. He has no wheezes. He has no rales. He exhibits tenderness.    Abdominal: Soft. Bowel sounds are normal. He exhibits no distension and no mass. There is no tenderness. There is no rebound and no guarding.  Musculoskeletal: Normal range of motion. He exhibits no edema or tenderness.  Neurological: He is alert and oriented to person, place, and time. He displays normal reflexes. He exhibits normal muscle tone.    Skin: Skin is warm and dry. No rash noted. He is not diaphoretic. No erythema. No pallor.  Psychiatric: He has a normal mood and affect. His behavior is normal.    ED Course  Procedures (including critical care time) Labs Review Labs Reviewed  COMPREHENSIVE METABOLIC PANEL - Abnormal; Notable for the following:    Glucose, Bld 101 (*)    BUN 21 (*)    All other components within normal limits  CBC  TROPONIN I    Imaging Review Dg Chest Port 1 View  03/07/2015   CLINICAL DATA:  Chest pain starting this morning  EXAM: PORTABLE CHEST - 1 VIEW  COMPARISON:  01/03/2015  FINDINGS: Cardiomediastinal silhouette is stable. No acute infiltrate  or pleural effusion. No pulmonary edema. There is a lytic lesion in right humeral neck with some cortical thinning. The lesion measures at least 4.1 by 3 cm. No aggressive features are noted. There is no cortical destruction. No pathologic fracture. 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 Further correlation with MRI is recommended as clinically warranted.  IMPRESSION: No active disease. Lytic lesion in right humeral neck without aggressive features measures about 4.1 x 3 cm. 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 Further evaluation with MRI is recommended as clinically warranted.   Electronically Signed   By: Lahoma Crocker M.D.   On: 03/07/2015 11:38     EKG Interpretation   Date/Time:  Friday March 07 2015 10:33:38 EDT Ventricular Rate:  104 PR Interval:  126 QRS Duration: 86 QT Interval:  334 QTC Calculation: 439 R Axis:   59 Text Interpretation:  Sinus tachycardia Otherwise normal ECG Confirmed by  ZACKOWSKI  MD, SCOTT ([id number]) on 03/07/2015 11:35:42 AM      MDM   Final diagnoses:  Anxiety  Chest pain, non-cardiac    Pt. Is a 59 y/o M here with reproducible chest pain. Known history of eosinophilic esophagitis. Atypical chest pain in nature. EKG normal, Troponin negative x 1, CMET, CBC normal. Pain improved  But not resolved with GI  cocktail, morphine. Nausea improved with phenergan. He has been here some time and has felt somewhat better.  Chest discomfort/ pain continues. Given his age and risk factors in addition to history, would admit for chest pain to rule out ACS.      Paula Compton, MD Family Medicine - PGY 2  Aquilla Hacker, MD 03/07/15 1250  Aquilla Hacker, MD 03/07/15 [place], MD 03/08/15 0730

## 2015-03-08 DIAGNOSIS — K200 Eosinophilic esophagitis: Secondary | ICD-10-CM | POA: Diagnosis not present

## 2015-03-08 DIAGNOSIS — R0789 Other chest pain: Secondary | ICD-10-CM | POA: Diagnosis not present

## 2015-03-08 MED ORDER — FLUTICASONE PROPIONATE HFA 220 MCG/ACT IN AERO: 2.0000 | INHALATION_SPRAY | Freq: Two times a day (BID) | RESPIRATORY_TRACT | Status: DC

## 2015-03-08 MED ORDER — PANTOPRAZOLE SODIUM 40 MG PO TBEC: 40.0000 mg | DELAYED_RELEASE_TABLET | Freq: Two times a day (BID) | ORAL | Status: DC

## 2015-03-08 MED ORDER — COLCHICINE 0.6 MG PO TABS: 0.6000 mg | ORAL_TABLET | Freq: Every day | ORAL | Status: DC

## 2015-03-08 NOTE — Progress Notes (Signed)
Discharged home, self care.

## 2015-03-08 NOTE — Discharge Summary (Signed)
Physician Discharge Summary  [name] EXB:[id number] DOB: Feb 08, 1956 DOA: 03/07/2015  PCP: [name], MD  Admit date: 03/07/2015 Discharge date: 03/08/2015  Time spent: 35 minutes  Recommendations for Outpatient Follow-up:  1. Would recommend Flonase by mouth for 5-7 days and then determine the need for further treatment by gastroenterology 2. Needs complete blood count as well as basic metabolic panel within 1 week 3. Needs outpatient follow-up and screening for tobacco/ethanol use-still drinks and has a prior history of cirrhosis 4. Recommended to stop non-steroidal's-may use colchicine cautiously with food --he was taking when necessary Protonix daily and I have recommended he take twice a day 40 mg Protonix on discharge 5. Recommend screening with PHQ-9/GAD 7 for bipolar with some mania  Discharge Diagnoses:  Principal Problem:   Eosinophilic esophagitis Active Problems:   Fibromyalgia   Chest pain   Dysphagia   Gout attack   Mixed bipolar I disorder   Discharge Condition: fair  Diet recommendation: HH low salt  Filed Weights   03/07/15 1035 03/07/15 1654 03/08/15 0500  Weight: 86.183 kg (190 lb) 83.3 kg (183 lb 10.3 oz) 83.462 kg (184 lb)    History of present illness:   59 y/o ? eosinophilic esophagitis + Hepatitis C diag 1991 Rx [facility] Rx Dr. Rushie Goltz admissions 11/20/12 for Low chest /epig pain, h/o reported fibromyalgia, Bipolar prior followed Dr. Andi Devon started on taper from Klonopin-->Librium and has been taking less of this recently, ongoing TOb abuse half pack per day, prior hematemesis 2007, current drinker 6-7 drinks of vodka at a time [q weekend?], prior suicidality?, Concern for Drug seeking behaviour in the past He also has a history of gout which started to flare on 03/01/15. He took Naprosyn for this and felt some relief. Yesterday he felt a little uncomfortable and took some Naprosyn 400 mg at night. 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 Between 7 and 7:30 AM he claims he had nausea and chest pain and felt woozy and lightheaded for about 10-15 seconds. He did not black out He did not have any blurred or double vision or any unilateral weakness. He states that his appetite has been decreased as it usually is in the summer because of the heat  Patient was admitted overnight and was monitored for chest pain as he presented with those symptomatologies as well as nausea and syncope. Telemetry overnight did not show any findings. Troponin next 3 did not show any concerns EKG repeat in the a.m. was not concerning for ischemia  It was felt that his symptoms were because of dysphagia and potential use of non-steroidal's for gout flare hemodynamically stable on discharge without any concerns for acute coronary syndrome   He was recommended to continue Flonase by mouth which has been steady at the most in terms of steroidal's for eosinophilic esophagitis which may have flared from his NSAID use. In addition it was emphasized to him to stop smoking cut down on caffeine and potentially stop drinking and he will need screening and follow-up of these issues as an outpatient    Discharge Exam: Filed Vitals:   03/08/15 0500  BP: 119/71  Pulse: 71  Temp: 98.2 F (36.8 C)  Resp: 18    General: EOMI NCAT Cardiovascular: S1-S2 no murmur rub or gallop Respiratory-intact   Discharge Instructions   Discharge Instructions    Diet -  low sodium heart healthy    Complete by:  As directed      Discharge instructions    Complete by:  As directed   Please take the Flonase orally as we have indicated to You may try the colchicine if you have severe pain-take the tablet 1 time a day-some evidence suggests that taking it 1 time a day and then an hour after 1 more dose will help with her gout. Try not to take any  other non-steroidal take this medication with food as it can upset ur stomach. Please try to quit smoking Please try to eat something in addition to your morning routine to help diminish your stomach symptoms Please follow-up with Dr. Earlean Shawl and I hope you have a good summer     Increase activity slowly    Complete by:  As directed           Current Discharge Medication List    START taking these medications   Details  colchicine 0.6 MG tablet Take 1 tablet (0.6 mg total) by mouth daily. Qty: 20 tablet, Refills: 0    fluticasone (FLOVENT HFA) 220 MCG/ACT inhaler Inhale 2 puffs into the lungs 2 (two) times daily. Qty: 1 Inhaler, Refills: 12    ondansetron (ZOFRAN) 4 MG tablet Take 1 tablet (4 mg total) by mouth every 8 (eight) hours as needed for nausea or vomiting. Qty: 20 tablet, Refills: 0      CONTINUE these medications which have NOT CHANGED   Details  chlordiazePOXIDE (LIBRIUM) 25 MG capsule Take 12.5-25 mg by mouth 3 (three) times daily as needed for anxiety.     guaiFENesin (MUCINEX) 600 MG 12 hr tablet Take 600 mg by mouth at bedtime as needed for cough or to loosen phlegm.    Multiple Vitamins-Minerals (ONE-A-DAY MENS 50+ ADVANTAGE PO) Take 2 tablets by mouth daily.     traZODone (DESYREL) 50 MG tablet Take 50 mg by mouth at bedtime.     VENTOLIN HFA 108 (90 BASE) MCG/ACT inhaler Inhale 2 puffs into the lungs daily as needed for shortness of breath.  Refills: 0    zolpidem (AMBIEN) 10 MG tablet Take 10 mg by mouth at bedtime as needed for sleep.  Refills: 0    pantoprazole (PROTONIX) 20 MG tablet Take 2 tablets (40 mg total) by mouth daily. Qty: 30 tablet, Refills: 0       Allergies  Allergen Reactions  . Prednisone Other (See Comments)    States that this med makes him "crazy"  . Tetanus Toxoids Swelling and Other (See Comments)    Fever, Swelling of the arm   . Wellbutrin [Bupropion] Other (See Comments)    Crazy thoughts, nightmares   Follow-up  Information    Schedule an appointment as soon as possible for a visit with [name], MD.   Specialty:  Family Medicine   Contact information:   [address] [place] [address] [phone]        The results of significant diagnostics from this hospitalization (including imaging, microbiology, ancillary and laboratory) are listed below for reference.    Significant Diagnostic Studies: Dg Chest Port 1 View  03/07/2015   CLINICAL DATA:  Chest pain starting this morning  EXAM: PORTABLE CHEST - 1 VIEW  COMPARISON:  01/03/2015  FINDINGS: Cardiomediastinal silhouette is stable. No acute infiltrate or pleural effusion. No pulmonary edema. There is a lytic lesion in right humeral neck with some cortical thinning. The lesion measures at  least 4.1 by 3 cm. No aggressive features are noted. There is no cortical destruction. No pathologic fracture. 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 Further correlation with MRI is recommended as clinically warranted.  IMPRESSION: No active disease. Lytic lesion in right humeral neck without aggressive features measures about 4.1 x 3 cm. 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 Further evaluation with MRI is recommended as clinically warranted.   Electronically Signed   By: Lahoma Crocker M.D.   On: 03/07/2015 11:38    Microbiology: No results found for this or any previous visit (from the past 240 hour(s)).   Labs: Basic Metabolic Panel:  Recent Labs Lab 03/07/15 1120 03/07/15 1843  NA 141  --   K 4.4  --   CL 106  --   CO2 28  --   GLUCOSE 101*  --   BUN 21*  --   CREATININE 1.08 0.99  CALCIUM 9.8  --    Liver Function Tests:  Recent Labs Lab 03/07/15 1120  AST 25  ALT 17  ALKPHOS 54  BILITOT 0.6  PROT 8.0  ALBUMIN 4.5   No results for input(s): LIPASE, AMYLASE in the last 168 hours. No results for input(s): AMMONIA in the last 168 hours. CBC:  Recent Labs Lab 03/07/15 1120 03/07/15 1843  WBC 10.1 9.3  HGB  14.1 16.0  HCT 42.7 47.7  MCV 93.4 93.9  PLT 219 227   Cardiac Enzymes:  Recent Labs Lab 03/07/15 1120  TROPONINI <0.03   BNP: BNP (last 3 results) No results for input(s): BNP in the last 8760 hours.  ProBNP (last 3 results) No results for input(s): PROBNP in the last 8760 hours.  CBG: No results for input(s): GLUCAP in the last 168 hours.     SignedNita Sells  [facility] 03/08/2015, 9:39 AM

## 2015-10-29 DIAGNOSIS — I219 Acute myocardial infarction, unspecified: Secondary | ICD-10-CM

## 2015-10-29 HISTORY — DX: Acute myocardial infarction, unspecified: I21.9

## 2015-11-14 ENCOUNTER — Emergency Department (HOSPITAL_BASED_OUTPATIENT_CLINIC_OR_DEPARTMENT_OTHER): Payer: BC Managed Care – PPO

## 2015-11-14 ENCOUNTER — Emergency Department (HOSPITAL_BASED_OUTPATIENT_CLINIC_OR_DEPARTMENT_OTHER)
Admission: EM | Admit: 2015-11-14 | Discharge: 2015-11-14 | Disposition: A | Discharge status: Home / Self Care | Payer: BC Managed Care – PPO | Attending: Emergency Medicine | Admitting: Emergency Medicine

## 2015-11-14 ENCOUNTER — Encounter (HOSPITAL_BASED_OUTPATIENT_CLINIC_OR_DEPARTMENT_OTHER): Payer: Self-pay | Admitting: *Deleted

## 2015-11-14 DIAGNOSIS — F419 Anxiety disorder, unspecified: Secondary | ICD-10-CM | POA: Insufficient documentation

## 2015-11-14 DIAGNOSIS — Z8619 Personal history of other infectious and parasitic diseases: Secondary | ICD-10-CM | POA: Insufficient documentation

## 2015-11-14 DIAGNOSIS — Z8719 Personal history of other diseases of the digestive system: Secondary | ICD-10-CM | POA: Diagnosis not present

## 2015-11-14 DIAGNOSIS — B349 Viral infection, unspecified: Secondary | ICD-10-CM | POA: Insufficient documentation

## 2015-11-14 DIAGNOSIS — Z79899 Other long term (current) drug therapy: Secondary | ICD-10-CM | POA: Insufficient documentation

## 2015-11-14 DIAGNOSIS — F17210 Nicotine dependence, cigarettes, uncomplicated: Secondary | ICD-10-CM | POA: Diagnosis not present

## 2015-11-14 DIAGNOSIS — M791 Myalgia: Secondary | ICD-10-CM | POA: Diagnosis present

## 2015-11-14 NOTE — Discharge Instructions (Signed)
Please read and follow all provided instructions.  Your diagnoses today include:  1. Viral syndrome    You appear to have an upper respiratory infection (URI). An upper respiratory tract infection, or cold, is a viral infection of the air passages leading to the lungs. It should improve gradually after 5-7 days. You may have a lingering cough that lasts for 2- 4 weeks after the infection.  Tests performed today include:  Vital signs. See below for your results today.   Medications prescribed:   Take any prescribed medications only as directed. Treatment for your infection is aimed at treating the symptoms. There are no medications, such as antibiotics, that will cure your infection.   Home care instructions:  Follow any educational materials contained in this packet.   Your illness is contagious and can be spread to others, especially during the first 3 or 4 days. It cannot be cured by antibiotics or other medicines. Take basic precautions such as washing your hands often, covering your mouth when you cough or sneeze, and avoiding public places where you could spread your illness to others.   Please continue drinking plenty of fluids.  Use over-the-counter medicines as needed as directed on packaging for symptom relief.  You may also use ibuprofen or tylenol as directed on packaging for pain or fever.  Do not take multiple medicines containing Tylenol or acetaminophen to avoid taking too much of this medication.  Follow-up instructions: Please follow-up with your primary care provider in the next 3 days for further evaluation of your symptoms if you are not feeling better.   Return instructions:   Please return to the Emergency Department if you experience worsening symptoms.   RETURN IMMEDIATELY IF you develop shortness of breath, confusion or altered mental status, a new rash, become dizzy, faint, or poorly responsive, or are unable to be cared for at home.  Please return if you have  persistent vomiting and cannot keep down fluids or develop a fever that is not controlled by tylenol or motrin.    Please return if you have any other emergent concerns.  Additional Information:  Your vital signs today were: BP 111/95 mmHg   Pulse 94   Temp(Src) 98.3 F (36.8 C) (Oral)   Resp 18   Ht '5\' 9"'$  (1.753 m)   Wt 79.379 kg   BMI 25.83 kg/m2   SpO2 99% If your blood pressure (BP) was elevated above 135/85 this visit, please have this repeated by your doctor within one month. --------------

## 2015-11-14 NOTE — ED Provider Notes (Signed)
CSN: [id number]     Arrival date & time 11/14/15  1001 History   First MD Initiated Contact with Patient 11/14/15 1022     No chief complaint on file.  (Consider location/radiation/quality/duration/timing/severity/associated sxs/prior Treatment) HPI  60 y.o. male presents to the Emergency Department today complaining of URI symptoms since Tuesday. Associated generalized body aches, fever (101F), rhinorrhea, congestion, non productive cough. No N/V/D. No CP/ABD pain. Tried OTC mucinex without relief. Reports no pain. Notes sick contacts. No other symptoms noted.   Past Medical History  Diagnosis Date  . Neck pain   . Anxiety   . Esophageal reflux     eosinophil esophagitis  . Acute hepatitis C without mention of hepatic coma     Tx by [facility] for Hep.  negative for hep c 2/15.  . Colitis    Past Surgical History  Procedure Laterality Date  . Shoulder surgery     Family History  Problem Relation Age of Onset  . Autoimmune disease Neg Hx   . Lung cancer Mother   . Heart Problems Father    Social History  Substance Use Topics  . Smoking status: Current Every Day Smoker -- 0.50 packs/day    Types: Cigarettes  . Smokeless tobacco: Never Used  . Alcohol Use: No    Review of Systems ROS reviewed and all are negative for acute change except as noted in the HPI.  Allergies  Prednisone; Tetanus toxoids; Wellbutrin; and Chantix  Home Medications   Prior to Admission medications   Medication Sig Start Date End Date Taking? Authorizing Provider  chlordiazePOXIDE (LIBRIUM) 25 MG capsule Take 12.5-25 mg by mouth 3 (three) times daily as needed for anxiety.    Yes Historical Provider, MD  colchicine 0.6 MG tablet Take 1 tablet (0.6 mg total) by mouth daily. 03/08/15  Yes [name], MD  guaiFENesin (MUCINEX) 600 MG 12 hr tablet Take 600 mg by mouth at bedtime as needed for cough or to loosen phlegm.   Yes Historical Provider, MD  Multiple Vitamins-Minerals (ONE-A-DAY MENS 50+  ADVANTAGE PO) Take 2 tablets by mouth daily.    Yes Historical Provider, MD  traZODone (DESYREL) 50 MG tablet Take 50 mg by mouth at bedtime.    Yes Historical Provider, MD  zolpidem (AMBIEN) 10 MG tablet Take 10 mg by mouth at bedtime as needed for sleep.  03/04/15  Yes Historical Provider, MD   BP 111/95 mmHg  Pulse 94  Temp(Src) 98.3 F (36.8 C) (Oral)  Resp 18  Ht '5\' 9"'$  (1.753 m)  Wt 79.379 kg  BMI 25.83 kg/m2  SpO2 99%   Physical Exam  Constitutional: He is oriented to person, place, and time. He appears well-developed and well-nourished. No distress.  HENT:  Head: Normocephalic and atraumatic.  Right Ear: Tympanic membrane, external ear and ear canal normal.  Left Ear: Tympanic membrane, external ear and ear canal normal.  Nose: Rhinorrhea present. Right sinus exhibits maxillary sinus tenderness and frontal sinus tenderness. Left sinus exhibits maxillary sinus tenderness and frontal sinus tenderness.  Mouth/Throat: Uvula is midline, oropharynx is clear and moist and mucous membranes are normal. No trismus in the jaw. No oropharyngeal exudate, posterior oropharyngeal erythema or tonsillar abscesses.  Eyes: EOM are normal. Pupils are equal, round, and reactive to light.  Neck: Normal range of motion. Neck supple. No tracheal deviation present.  Cardiovascular: Normal rate, regular rhythm, S1 normal, S2 normal, normal heart sounds, intact distal pulses and normal pulses.   Pulmonary/Chest: Effort normal and  breath sounds normal. No respiratory distress. He has no decreased breath sounds. He has no wheezes. He has no rhonchi. He has no rales.  Abdominal: Normal appearance and bowel sounds are normal. There is no tenderness.  Musculoskeletal: Normal range of motion.  Neurological: He is alert and oriented to person, place, and time.  Skin: Skin is warm and dry.  Psychiatric: He has a normal mood and affect. His speech is normal and behavior is normal. Thought content normal.    ED  Course  Procedures (including critical care time) Labs Review Labs Reviewed - No data to display  Imaging Review Dg Chest 2 View  11/14/2015  CLINICAL DATA:  Cough for 3 days EXAM: CHEST  2 VIEW COMPARISON:  03/07/2015 FINDINGS: Normal heart size. Lungs clear. No pneumothorax. No pleural effusion. IMPRESSION: No active cardiopulmonary disease. Electronically Signed   By: Marybelle Killings M.D.   On: 11/14/2015 11:18   I have personally reviewed and evaluated these images and lab results as part of my medical decision-making.   EKG Interpretation None      MDM  I have reviewed and evaluated the relevant imaging studies. I have reviewed the relevant previous healthcare records.I obtained HPI from historian.  ED Course:  Assessment: Pt is a 59yM presents with URI symptoms since Tuesday . On exam, pt in NAD. VSS. Afebrile. Lungs CTA, Heart RRR. Abdomen nontender/soft. Pt CXR negative for acute infiltrate. Patients symptoms are consistent with URI, likely viral etiology. Discussed that antibiotics are not indicated for viral infections. Pt will be discharged with symptomatic treatment.  Verbalizes understanding and is agreeable with plan. Pt is hemodynamically stable & in NAD prior to dc.  Disposition/Plan:  DC Home Additional Verbal discharge instructions given and discussed with patient.  Pt Instructed to f/u with PCP in the next 48-72 hours for evaluation and treatment of symptoms. Return precautions given Pt acknowledges and agrees with plan  Supervising Physician Davonna Belling, MD   Final diagnoses:  Viral syndrome       Shary Decamp, PA-C 11/14/15 [place], MD 11/14/15 1414

## 2015-11-14 NOTE — ED Notes (Signed)
C/o cough clear sputum since Tuesday with fever and body aches. Cough is worse at night. Felt sob after coughing.

## 2015-11-28 ENCOUNTER — Encounter (HOSPITAL_COMMUNITY)
Admission: EM | Disposition: A | Discharge status: Home / Self Care | Payer: Self-pay | Source: Home / Self Care | Attending: Surgery

## 2015-11-28 ENCOUNTER — Encounter (HOSPITAL_COMMUNITY): Payer: Self-pay | Admitting: Certified Registered Nurse Anesthetist

## 2015-11-28 ENCOUNTER — Inpatient Hospital Stay (HOSPITAL_COMMUNITY)
Admission: EM | Admit: 2015-11-28 | Discharge: 2015-12-04 | DRG: 232 | Disposition: A | Discharge status: Home / Self Care | Payer: BC Managed Care – PPO | Attending: Surgery | Admitting: Surgery

## 2015-11-28 ENCOUNTER — Ambulatory Visit (HOSPITAL_COMMUNITY): Payer: BC Managed Care – PPO | Admitting: Certified Registered Nurse Anesthetist

## 2015-11-28 ENCOUNTER — Ambulatory Visit (HOSPITAL_COMMUNITY): Payer: BC Managed Care – PPO

## 2015-11-28 ENCOUNTER — Inpatient Hospital Stay (HOSPITAL_COMMUNITY): Payer: BC Managed Care – PPO

## 2015-11-28 DIAGNOSIS — Z79899 Other long term (current) drug therapy: Secondary | ICD-10-CM | POA: Diagnosis not present

## 2015-11-28 DIAGNOSIS — F17210 Nicotine dependence, cigarettes, uncomplicated: Secondary | ICD-10-CM | POA: Diagnosis present

## 2015-11-28 DIAGNOSIS — J9811 Atelectasis: Secondary | ICD-10-CM | POA: Diagnosis not present

## 2015-11-28 DIAGNOSIS — J40 Bronchitis, not specified as acute or chronic: Secondary | ICD-10-CM | POA: Diagnosis not present

## 2015-11-28 DIAGNOSIS — I2109 ST elevation (STEMI) myocardial infarction involving other coronary artery of anterior wall: Secondary | ICD-10-CM | POA: Diagnosis not present

## 2015-11-28 DIAGNOSIS — I255 Ischemic cardiomyopathy: Secondary | ICD-10-CM | POA: Diagnosis present

## 2015-11-28 DIAGNOSIS — D62 Acute posthemorrhagic anemia: Secondary | ICD-10-CM | POA: Diagnosis not present

## 2015-11-28 DIAGNOSIS — K210 Gastro-esophageal reflux disease with esophagitis: Secondary | ICD-10-CM | POA: Diagnosis present

## 2015-11-28 DIAGNOSIS — I2102 ST elevation (STEMI) myocardial infarction involving left anterior descending coronary artery: Secondary | ICD-10-CM | POA: Diagnosis not present

## 2015-11-28 DIAGNOSIS — R079 Chest pain, unspecified: Secondary | ICD-10-CM | POA: Diagnosis present

## 2015-11-28 DIAGNOSIS — I2510 Atherosclerotic heart disease of native coronary artery without angina pectoris: Secondary | ICD-10-CM | POA: Diagnosis present

## 2015-11-28 DIAGNOSIS — D696 Thrombocytopenia, unspecified: Secondary | ICD-10-CM | POA: Diagnosis not present

## 2015-11-28 DIAGNOSIS — Z720 Tobacco use: Secondary | ICD-10-CM

## 2015-11-28 DIAGNOSIS — Z951 Presence of aortocoronary bypass graft: Secondary | ICD-10-CM

## 2015-11-28 DIAGNOSIS — I25110 Atherosclerotic heart disease of native coronary artery with unstable angina pectoris: Secondary | ICD-10-CM

## 2015-11-28 DIAGNOSIS — I2582 Chronic total occlusion of coronary artery: Secondary | ICD-10-CM | POA: Diagnosis not present

## 2015-11-28 DIAGNOSIS — E8770 Fluid overload, unspecified: Secondary | ICD-10-CM | POA: Diagnosis not present

## 2015-11-28 DIAGNOSIS — I25119 Atherosclerotic heart disease of native coronary artery with unspecified angina pectoris: Secondary | ICD-10-CM

## 2015-11-28 DIAGNOSIS — I213 ST elevation (STEMI) myocardial infarction of unspecified site: Secondary | ICD-10-CM

## 2015-11-28 DIAGNOSIS — B1920 Unspecified viral hepatitis C without hepatic coma: Secondary | ICD-10-CM | POA: Diagnosis present

## 2015-11-28 DIAGNOSIS — I2584 Coronary atherosclerosis due to calcified coronary lesion: Secondary | ICD-10-CM | POA: Diagnosis present

## 2015-11-28 DIAGNOSIS — F17201 Nicotine dependence, unspecified, in remission: Secondary | ICD-10-CM

## 2015-11-28 HISTORY — PX: CORONARY ARTERY BYPASS GRAFT: SHX141

## 2015-11-28 HISTORY — PX: CARDIAC CATHETERIZATION: SHX172

## 2015-11-28 LAB — POCT I-STAT 3, ART BLOOD GAS (G3+)
Acid-Base Excess: 4 mmol/L — ABNORMAL HIGH (ref 0.0–2.0)
Acid-Base Excess: 3 mmol/L — ABNORMAL HIGH (ref 0.0–2.0)
Bicarbonate: 29.7 mEq/L — ABNORMAL HIGH (ref 20.0–24.0)
Bicarbonate: 28.3 mEq/L — ABNORMAL HIGH (ref 20.0–24.0)
O2 Saturation: 100 %
O2 Saturation: 100 %
TCO2: 31 mmol/L (ref 0–100)
TCO2: 30 mmol/L (ref 0–100)
pCO2 arterial: 48.5 mmHg — ABNORMAL HIGH (ref 35.0–45.0)
pCO2 arterial: 48.7 mmHg — ABNORMAL HIGH (ref 35.0–45.0)
pH, Arterial: 7.395 (ref 7.350–7.450)
pH, Arterial: 7.373 (ref 7.350–7.450)
pO2, Arterial: 444 mmHg — ABNORMAL HIGH (ref 80.0–100.0)
pO2, Arterial: 390 mmHg — ABNORMAL HIGH (ref 80.0–100.0)

## 2015-11-28 LAB — POCT I-STAT, CHEM 8
BUN: 19 mg/dL (ref 6–20)
BUN: 15 mg/dL (ref 6–20)
BUN: 12 mg/dL (ref 6–20)
BUN: 14 mg/dL (ref 6–20)
BUN: 12 mg/dL (ref 6–20)
BUN: 12 mg/dL (ref 6–20)
Calcium, Ion: 1.19 mmol/L (ref 1.13–1.30)
Calcium, Ion: 1.23 mmol/L (ref 1.13–1.30)
Calcium, Ion: 1.01 mmol/L — ABNORMAL LOW (ref 1.13–1.30)
Calcium, Ion: 1.2 mmol/L (ref 1.13–1.30)
Calcium, Ion: 0.99 mmol/L — ABNORMAL LOW (ref 1.13–1.30)
Calcium, Ion: 0.99 mmol/L — ABNORMAL LOW (ref 1.13–1.30)
Chloride: 104 mmol/L (ref 101–111)
Chloride: 106 mmol/L (ref 101–111)
Chloride: 104 mmol/L (ref 101–111)
Chloride: 106 mmol/L (ref 101–111)
Chloride: 103 mmol/L (ref 101–111)
Chloride: 102 mmol/L (ref 101–111)
Creatinine, Ser: 0.8 mg/dL (ref 0.61–1.24)
Creatinine, Ser: 0.8 mg/dL (ref 0.61–1.24)
Creatinine, Ser: 0.6 mg/dL — ABNORMAL LOW (ref 0.61–1.24)
Creatinine, Ser: 0.7 mg/dL (ref 0.61–1.24)
Creatinine, Ser: 0.7 mg/dL (ref 0.61–1.24)
Creatinine, Ser: 0.6 mg/dL — ABNORMAL LOW (ref 0.61–1.24)
Glucose, Bld: 131 mg/dL — ABNORMAL HIGH (ref 65–99)
Glucose, Bld: 120 mg/dL — ABNORMAL HIGH (ref 65–99)
Glucose, Bld: 101 mg/dL — ABNORMAL HIGH (ref 65–99)
Glucose, Bld: 96 mg/dL (ref 65–99)
Glucose, Bld: 104 mg/dL — ABNORMAL HIGH (ref 65–99)
Glucose, Bld: 127 mg/dL — ABNORMAL HIGH (ref 65–99)
HCT: 46 % (ref 39.0–52.0)
HCT: 45 % (ref 39.0–52.0)
HCT: 31 % — ABNORMAL LOW (ref 39.0–52.0)
HCT: 39 % (ref 39.0–52.0)
HCT: 26 % — ABNORMAL LOW (ref 39.0–52.0)
HCT: 28 % — ABNORMAL LOW (ref 39.0–52.0)
Hemoglobin: 15.6 g/dL (ref 13.0–17.0)
Hemoglobin: 15.3 g/dL (ref 13.0–17.0)
Hemoglobin: 10.5 g/dL — ABNORMAL LOW (ref 13.0–17.0)
Hemoglobin: 13.3 g/dL (ref 13.0–17.0)
Hemoglobin: 8.8 g/dL — ABNORMAL LOW (ref 13.0–17.0)
Hemoglobin: 9.5 g/dL — ABNORMAL LOW (ref 13.0–17.0)
Potassium: 3.7 mmol/L (ref 3.5–5.1)
Potassium: 3.9 mmol/L (ref 3.5–5.1)
Potassium: 4.4 mmol/L (ref 3.5–5.1)
Potassium: 5.7 mmol/L — ABNORMAL HIGH (ref 3.5–5.1)
Potassium: 5.6 mmol/L — ABNORMAL HIGH (ref 3.5–5.1)
Potassium: 4 mmol/L (ref 3.5–5.1)
Sodium: 140 mmol/L (ref 135–145)
Sodium: 141 mmol/L (ref 135–145)
Sodium: 141 mmol/L (ref 135–145)
Sodium: 141 mmol/L (ref 135–145)
Sodium: 136 mmol/L (ref 135–145)
Sodium: 140 mmol/L (ref 135–145)
TCO2: 22 mmol/L (ref 0–100)
TCO2: 24 mmol/L (ref 0–100)
TCO2: 29 mmol/L (ref 0–100)
TCO2: 30 mmol/L (ref 0–100)
TCO2: 27 mmol/L (ref 0–100)
TCO2: 28 mmol/L (ref 0–100)

## 2015-11-28 LAB — COMPREHENSIVE METABOLIC PANEL
ALT: 24 U/L (ref 17–63)
AST: 26 U/L (ref 15–41)
Albumin: 4.2 g/dL (ref 3.5–5.0)
Alkaline Phosphatase: 55 U/L (ref 38–126)
Anion gap: 11 (ref 5–15)
BUN: 17 mg/dL (ref 6–20)
CO2: 21 mmol/L — ABNORMAL LOW (ref 22–32)
Calcium: 9.3 mg/dL (ref 8.9–10.3)
Chloride: 106 mmol/L (ref 101–111)
Creatinine, Ser: 0.98 mg/dL (ref 0.61–1.24)
GFR calc Af Amer: >60 mL/min (ref >60)
GFR calc non Af Amer: >60 mL/min (ref >60)
Glucose, Bld: 130 mg/dL — ABNORMAL HIGH (ref 65–99)
Potassium: 3.7 mmol/L (ref 3.5–5.1)
Sodium: 138 mmol/L (ref 135–145)
Total Bilirubin: 1.1 mg/dL (ref 0.3–1.2)
Total Protein: 6.8 g/dL (ref 6.5–8.1)

## 2015-11-28 LAB — DIFFERENTIAL
Basophils Absolute: 0 10*3/uL (ref 0.0–0.1)
Basophils Relative: 0 %
Eosinophils Absolute: 0 10*3/uL (ref 0.0–0.7)
Eosinophils Relative: 0 %
Lymphocytes Relative: 18 %
Lymphs Abs: 1.8 10*3/uL (ref 0.7–4.0)
Monocytes Absolute: 0.6 10*3/uL (ref 0.1–1.0)
Monocytes Relative: 6 %
Neutro Abs: 7.5 10*3/uL (ref 1.7–7.7)
Neutrophils Relative %: 76 %

## 2015-11-28 LAB — POCT ACTIVATED CLOTTING TIME
Activated Clotting Time: 343 seconds
Activated Clotting Time: 312 seconds

## 2015-11-28 LAB — CBC
HCT: 43 % (ref 39.0–52.0)
HCT: 32.4 % — ABNORMAL LOW (ref 39.0–52.0)
Hemoglobin: 14.3 g/dL (ref 13.0–17.0)
Hemoglobin: 10.5 g/dL — ABNORMAL LOW (ref 13.0–17.0)
MCH: 29.9 pg (ref 26.0–34.0)
MCH: 29.4 pg (ref 26.0–34.0)
MCHC: 33.3 g/dL (ref 30.0–36.0)
MCHC: 32.4 g/dL (ref 30.0–36.0)
MCV: 89.8 fL (ref 78.0–100.0)
MCV: 90.8 fL (ref 78.0–100.0)
Platelets: 256 10*3/uL (ref 150–400)
Platelets: 137 10*3/uL — ABNORMAL LOW (ref 150–400)
RBC: 4.79 MIL/uL (ref 4.22–5.81)
RBC: 3.57 MIL/uL — ABNORMAL LOW (ref 4.22–5.81)
RDW: 13 % (ref 11.5–15.5)
RDW: 13 % (ref 11.5–15.5)
WBC: 9.9 10*3/uL (ref 4.0–10.5)
WBC: 14.1 10*3/uL — ABNORMAL HIGH (ref 4.0–10.5)

## 2015-11-28 LAB — PROTIME-INR
INR: 0.98 (ref 0.00–1.49)
INR: 1.29 (ref 0.00–1.49)
Prothrombin Time: 13.2 seconds (ref 11.6–15.2)
Prothrombin Time: 16.2 seconds — ABNORMAL HIGH (ref 11.6–15.2)

## 2015-11-28 LAB — LIPID PANEL
Cholesterol: 202 mg/dL — ABNORMAL HIGH (ref 0–200)
HDL: 42 mg/dL (ref >40)
LDL Cholesterol: 127 mg/dL — ABNORMAL HIGH (ref 0–99)
Total CHOL/HDL Ratio: 4.8 RATIO
Triglycerides: 167 mg/dL — ABNORMAL HIGH (ref <150)
VLDL: 33 mg/dL (ref 0–40)

## 2015-11-28 LAB — TROPONIN I
Troponin I: 0.47 ng/mL — ABNORMAL HIGH (ref <0.031)
Troponin I: 58.83 ng/mL (ref <0.031)

## 2015-11-28 LAB — TYPE AND SCREEN
ABO/RH(D): O POS
Antibody Screen: NEGATIVE

## 2015-11-28 LAB — HEMOGLOBIN AND HEMATOCRIT, BLOOD
HCT: 28.6 % — ABNORMAL LOW (ref 39.0–52.0)
Hemoglobin: 9.4 g/dL — ABNORMAL LOW (ref 13.0–17.0)

## 2015-11-28 LAB — APTT
aPTT: 37 seconds (ref 24–37)
aPTT: 55 seconds — ABNORMAL HIGH (ref 24–37)

## 2015-11-28 LAB — PLATELET COUNT: Platelets: 147 10*3/uL — ABNORMAL LOW (ref 150–400)

## 2015-11-28 SURGERY — CORONARY ARTERY BYPASS GRAFTING (CABG)
Anesthesia: General | Site: Chest

## 2015-11-28 SURGERY — LEFT HEART CATH AND CORONARY ANGIOGRAPHY
Anesthesia: LOCAL

## 2015-11-28 MED ORDER — SODIUM CHLORIDE 0.45 % IV SOLN
INTRAVENOUS | Status: DC | PRN
  Administered 2015-11-28: 19:00:00 via INTRAVENOUS

## 2015-11-28 MED ORDER — DOPAMINE-DEXTROSE 3.2-5 MG/ML-% IV SOLN
0.0000 ug/kg/min | INTRAVENOUS | Status: AC
  Administered 2015-11-28: 3 ug/min via INTRAVENOUS
  Filled 2015-11-28: qty 250

## 2015-11-28 MED ORDER — DEXTROSE 5 % IV SOLN
1.5000 g | Freq: Two times a day (BID) | INTRAVENOUS | Status: AC
  Administered 2015-11-29 – 2015-11-30 (×4): 1.5 g via INTRAVENOUS
  Filled 2015-11-28 (×4): qty 1.5

## 2015-11-28 MED ORDER — PROPOFOL 10 MG/ML IV BOLUS
INTRAVENOUS | Status: AC
  Filled 2015-11-28: qty 20

## 2015-11-28 MED ORDER — MAGNESIUM SULFATE 4 GM/100ML IV SOLN
4.0000 g | Freq: Once | INTRAVENOUS | Status: AC
  Administered 2015-11-28: 4 g via INTRAVENOUS
  Filled 2015-11-28: qty 100

## 2015-11-28 MED ORDER — VECURONIUM BROMIDE 10 MG IV SOLR
INTRAVENOUS | Status: DC | PRN
  Administered 2015-11-28 (×2): 5 mg via INTRAVENOUS
  Administered 2015-11-28: 10 mg via INTRAVENOUS
  Administered 2015-11-28 (×2): 5 mg via INTRAVENOUS

## 2015-11-28 MED ORDER — 0.9 % SODIUM CHLORIDE (POUR BTL) OPTIME
TOPICAL | Status: DC | PRN
  Administered 2015-11-28: 1000 mL

## 2015-11-28 MED ORDER — ARTIFICIAL TEARS OP OINT
TOPICAL_OINTMENT | OPHTHALMIC | Status: DC | PRN
  Administered 2015-11-28: 1 via OPHTHALMIC

## 2015-11-28 MED ORDER — SODIUM CHLORIDE 0.9% FLUSH
3.0000 mL | Freq: Two times a day (BID) | INTRAVENOUS | Status: DC
  Administered 2015-11-29 – 2015-11-30 (×4): 3 mL via INTRAVENOUS

## 2015-11-28 MED ORDER — ROCURONIUM BROMIDE 100 MG/10ML IV SOLN
INTRAVENOUS | Status: DC | PRN
  Administered 2015-11-28: 50 mg via INTRAVENOUS

## 2015-11-28 MED ORDER — TRAMADOL HCL 50 MG PO TABS
50.0000 mg | ORAL_TABLET | ORAL | Status: DC | PRN
  Administered 2015-11-29: 100 mg via ORAL
  Filled 2015-11-28: qty 2

## 2015-11-28 MED ORDER — ACETAMINOPHEN 160 MG/5ML PO SOLN: 650.0000 mg | Freq: Once | ORAL | Status: AC

## 2015-11-28 MED ORDER — ACETAMINOPHEN 500 MG PO TABS
1000.0000 mg | ORAL_TABLET | Freq: Four times a day (QID) | ORAL | Status: DC
  Administered 2015-11-29 – 2015-12-01 (×8): 1000 mg via ORAL
  Filled 2015-11-28 (×8): qty 2

## 2015-11-28 MED ORDER — BIVALIRUDIN 250 MG IV SOLR
INTRAVENOUS | Status: AC
  Filled 2015-11-28: qty 250

## 2015-11-28 MED ORDER — PROTAMINE SULFATE 10 MG/ML IV SOLN
INTRAVENOUS | Status: DC | PRN
Start: 2015-11-28 — End: 2015-11-28
  Administered 2015-11-28: 5 mg via INTRAVENOUS

## 2015-11-28 MED ORDER — CHLORHEXIDINE GLUCONATE 0.12 % MT SOLN
15.0000 mL | OROMUCOSAL | Status: AC
  Administered 2015-11-28: 15 mL via OROMUCOSAL

## 2015-11-28 MED ORDER — PANTOPRAZOLE SODIUM 40 MG PO TBEC: 40.0000 mg | DELAYED_RELEASE_TABLET | Freq: Every day | ORAL | Status: DC

## 2015-11-28 MED ORDER — GELATIN ABSORBABLE 12-7 MM EX MISC
CUTANEOUS | Status: DC | PRN
  Administered 2015-11-28: 2
  Administered 2015-11-28: 1

## 2015-11-28 MED ORDER — DEXMEDETOMIDINE HCL IN NACL 400 MCG/100ML IV SOLN
0.1000 ug/kg/h | INTRAVENOUS | Status: AC
  Administered 2015-11-28: .7 ug/kg/h via INTRAVENOUS
  Filled 2015-11-28: qty 100

## 2015-11-28 MED ORDER — MILRINONE IN DEXTROSE 20 MG/100ML IV SOLN
0.3750 ug/kg/min | INTRAVENOUS | Status: DC
  Filled 2015-11-28: qty 100

## 2015-11-28 MED ORDER — LACTATED RINGERS IV SOLN
INTRAVENOUS | Status: DC
  Administered 2015-11-28: 20 mL/h via INTRAVENOUS

## 2015-11-28 MED ORDER — METOPROLOL TARTRATE 25 MG/10 ML ORAL SUSPENSION: 12.5000 mg | Freq: Two times a day (BID) | ORAL | Status: DC

## 2015-11-28 MED ORDER — MIDAZOLAM HCL 2 MG/2ML IJ SOLN
INTRAMUSCULAR | Status: AC
  Filled 2015-11-28: qty 2

## 2015-11-28 MED ORDER — DOPAMINE-DEXTROSE 3.2-5 MG/ML-% IV SOLN
0.0000 ug/kg/min | INTRAVENOUS | Status: DC
  Administered 2015-11-28: 3 ug/kg/min via INTRAVENOUS

## 2015-11-28 MED ORDER — THROMBIN 20000 UNITS EX SOLR
CUTANEOUS | Status: AC
  Filled 2015-11-28: qty 20000

## 2015-11-28 MED ORDER — SODIUM CHLORIDE 0.9 % IV SOLN
INTRAVENOUS | Status: AC
  Administered 2015-11-28: 69.8 mL/h via INTRAVENOUS
  Filled 2015-11-28 (×2): qty 40

## 2015-11-28 MED ORDER — HEPARIN SODIUM (PORCINE) 1000 UNIT/ML IJ SOLN
INTRAMUSCULAR | Status: DC | PRN
  Administered 2015-11-28: 5000 [IU] via INTRAVENOUS
  Administered 2015-11-28: 15000 [IU] via INTRAVENOUS

## 2015-11-28 MED ORDER — COLCHICINE 0.6 MG PO TABS
0.6000 mg | ORAL_TABLET | Freq: Every day | ORAL | Status: DC
  Administered 2015-11-29 – 2015-12-04 (×4): 0.6 mg via ORAL
  Filled 2015-11-28 (×6): qty 1

## 2015-11-28 MED ORDER — PROTAMINE SULFATE 10 MG/ML IV SOLN
INTRAVENOUS | Status: AC
  Filled 2015-11-28: qty 25

## 2015-11-28 MED ORDER — BIVALIRUDIN 250 MG IV SOLR
250.0000 mg | INTRAVENOUS | Status: DC | PRN
  Administered 2015-11-28 (×2): 1.75 mg/kg/h via INTRAVENOUS

## 2015-11-28 MED ORDER — METOPROLOL TARTRATE 12.5 MG HALF TABLET: 12.5000 mg | ORAL_TABLET | Freq: Two times a day (BID) | ORAL | Status: DC

## 2015-11-28 MED ORDER — SODIUM CHLORIDE 0.9 % IV SOLN
0.2500 mg/kg/h | INTRAVENOUS | Status: DC
  Filled 2015-11-28: qty 250

## 2015-11-28 MED ORDER — MIDAZOLAM HCL 2 MG/2ML IJ SOLN
2.0000 mg | INTRAMUSCULAR | Status: DC | PRN
  Administered 2015-11-28 (×3): 2 mg via INTRAVENOUS
  Filled 2015-11-28 (×2): qty 2

## 2015-11-28 MED ORDER — ZOLPIDEM TARTRATE 5 MG PO TABS
10.0000 mg | ORAL_TABLET | Freq: Every evening | ORAL | Status: DC | PRN
  Administered 2015-11-30 – 2015-12-03 (×4): 10 mg via ORAL
  Filled 2015-11-28 (×4): qty 2

## 2015-11-28 MED ORDER — VECURONIUM BROMIDE 10 MG IV SOLR
INTRAVENOUS | Status: AC
  Filled 2015-11-28: qty 30

## 2015-11-28 MED ORDER — VANCOMYCIN HCL IN DEXTROSE 1-5 GM/200ML-% IV SOLN
1000.0000 mg | Freq: Once | INTRAVENOUS | Status: AC
  Administered 2015-11-29: 1000 mg via INTRAVENOUS
  Filled 2015-11-28: qty 200

## 2015-11-28 MED ORDER — MAGNESIUM SULFATE 50 % IJ SOLN
40.0000 meq | INTRAMUSCULAR | Status: DC
  Filled 2015-11-28 (×2): qty 10

## 2015-11-28 MED ORDER — DEXTROSE 5 % IV SOLN
1.5000 g | INTRAVENOUS | Status: AC
  Administered 2015-11-28: 1.5 g via INTRAVENOUS
  Administered 2015-11-28: .75 g via INTRAVENOUS
  Filled 2015-11-28 (×2): qty 1.5

## 2015-11-28 MED ORDER — FENTANYL CITRATE (PF) 250 MCG/5ML IJ SOLN
INTRAMUSCULAR | Status: AC
  Filled 2015-11-28: qty 5

## 2015-11-28 MED ORDER — LACTATED RINGERS IV SOLN
INTRAVENOUS | Status: DC
  Administered 2015-11-28 – 2015-11-30 (×2): 20 mL/h via INTRAVENOUS

## 2015-11-28 MED ORDER — TRAZODONE HCL 50 MG PO TABS
50.0000 mg | ORAL_TABLET | Freq: Every day | ORAL | Status: DC
  Administered 2015-11-29: 50 mg via ORAL
  Filled 2015-11-28 (×2): qty 1

## 2015-11-28 MED ORDER — BIVALIRUDIN BOLUS VIA INFUSION - CUPID
INTRAVENOUS | Status: DC | PRN
  Administered 2015-11-28: 59.25 mg via INTRAVENOUS

## 2015-11-28 MED ORDER — MILRINONE IN DEXTROSE 20 MG/100ML IV SOLN: 0.3000 ug/kg/min | INTRAVENOUS | Status: DC

## 2015-11-28 MED ORDER — MORPHINE SULFATE (PF) 2 MG/ML IV SOLN
1.0000 mg | INTRAVENOUS | Status: AC | PRN
  Administered 2015-11-28 – 2015-11-29 (×7): 2 mg via INTRAVENOUS
  Filled 2015-11-28 (×4): qty 2

## 2015-11-28 MED ORDER — CHLORDIAZEPOXIDE HCL 25 MG PO CAPS
12.5000 mg | ORAL_CAPSULE | Freq: Three times a day (TID) | ORAL | Status: DC | PRN
  Administered 2015-12-02 – 2015-12-03 (×2): 25 mg via ORAL
  Filled 2015-11-28 (×3): qty 1

## 2015-11-28 MED ORDER — ACETAMINOPHEN 160 MG/5ML PO SOLN: 1000.0000 mg | Freq: Four times a day (QID) | ORAL | Status: DC

## 2015-11-28 MED ORDER — FENTANYL CITRATE (PF) 100 MCG/2ML IJ SOLN
INTRAMUSCULAR | Status: AC
  Filled 2015-11-28: qty 2

## 2015-11-28 MED ORDER — PROPOFOL 10 MG/ML IV BOLUS
INTRAVENOUS | Status: DC | PRN
  Administered 2015-11-28: 70 mg via INTRAVENOUS

## 2015-11-28 MED ORDER — SODIUM CHLORIDE 0.9 % IV SOLN
INTRAVENOUS | Status: AC
  Administered 2015-11-28: 1.2 [IU]/h via INTRAVENOUS
  Filled 2015-11-28 (×2): qty 2.5

## 2015-11-28 MED ORDER — SODIUM CHLORIDE 0.9 % IV SOLN
INTRAVENOUS | Status: DC
  Filled 2015-11-28 (×2): qty 30

## 2015-11-28 MED ORDER — FENTANYL CITRATE (PF) 100 MCG/2ML IJ SOLN
INTRAMUSCULAR | Status: DC | PRN
  Administered 2015-11-28: 100 ug via INTRAVENOUS
  Administered 2015-11-28: 200 ug via INTRAVENOUS
  Administered 2015-11-28: 100 ug via INTRAVENOUS
  Administered 2015-11-28 (×2): 50 ug via INTRAVENOUS
  Administered 2015-11-28 (×2): 100 ug via INTRAVENOUS
  Administered 2015-11-28: 200 ug via INTRAVENOUS
  Administered 2015-11-28 (×3): 50 ug via INTRAVENOUS
  Administered 2015-11-28: 200 ug via INTRAVENOUS
  Administered 2015-11-28: 50 ug via INTRAVENOUS
  Administered 2015-11-28 (×2): 100 ug via INTRAVENOUS

## 2015-11-28 MED ORDER — LIDOCAINE HCL (PF) 1 % IJ SOLN
INTRAMUSCULAR | Status: AC
  Filled 2015-11-28: qty 30

## 2015-11-28 MED ORDER — ONDANSETRON HCL 4 MG/2ML IJ SOLN
4.0000 mg | Freq: Four times a day (QID) | INTRAMUSCULAR | Status: DC | PRN
  Administered 2015-11-29 (×2): 4 mg via INTRAVENOUS
  Filled 2015-11-28 (×2): qty 2

## 2015-11-28 MED ORDER — DEXMEDETOMIDINE HCL IN NACL 200 MCG/50ML IV SOLN
0.0000 ug/kg/h | INTRAVENOUS | Status: DC
  Administered 2015-11-28: 0.7 ug/kg/h via INTRAVENOUS
  Filled 2015-11-28 (×3): qty 50

## 2015-11-28 MED ORDER — ALBUMIN HUMAN 5 % IV SOLN
250.0000 mL | INTRAVENOUS | Status: AC | PRN
  Administered 2015-11-28 – 2015-11-29 (×4): 250 mL via INTRAVENOUS
  Filled 2015-11-28 (×2): qty 250

## 2015-11-28 MED ORDER — LACTATED RINGERS IV SOLN
INTRAVENOUS | Status: DC | PRN
  Administered 2015-11-28: 14:00:00 via INTRAVENOUS

## 2015-11-28 MED ORDER — HEMOSTATIC AGENTS (NO CHARGE) OPTIME
TOPICAL | Status: DC | PRN
  Administered 2015-11-28: 1 via TOPICAL

## 2015-11-28 MED ORDER — SODIUM CHLORIDE 0.9 % IV SOLN
INTRAVENOUS | Status: DC
  Administered 2015-11-28: 20:00:00 via INTRAVENOUS

## 2015-11-28 MED ORDER — SODIUM CHLORIDE 0.9 % IV SOLN: 250.0000 mL | INTRAVENOUS | Status: DC

## 2015-11-28 MED ORDER — NITROGLYCERIN IN D5W 200-5 MCG/ML-% IV SOLN
2.0000 ug/min | INTRAVENOUS | Status: AC
Start: 2015-11-28 — End: 2015-11-28
  Administered 2015-11-28: 10 ug/min via INTRAVENOUS
  Filled 2015-11-28: qty 250

## 2015-11-28 MED ORDER — DEXTROSE 5 % IV SOLN
30.0000 ug/min | INTRAVENOUS | Status: DC
  Administered 2015-11-28: 5 ug/min via INTRAVENOUS
  Filled 2015-11-28 (×2): qty 2

## 2015-11-28 MED ORDER — DOCUSATE SODIUM 100 MG PO CAPS
200.0000 mg | ORAL_CAPSULE | Freq: Every day | ORAL | Status: DC
  Administered 2015-11-29 – 2015-12-01 (×3): 200 mg via ORAL
  Filled 2015-11-28 (×3): qty 2

## 2015-11-28 MED ORDER — INSULIN REGULAR BOLUS VIA INFUSION
0.0000 [IU] | Freq: Three times a day (TID) | INTRAVENOUS | Status: DC
  Administered 2015-11-29: 0 [IU]/h via INTRAVENOUS
  Filled 2015-11-28: qty 10

## 2015-11-28 MED ORDER — MIDAZOLAM HCL 5 MG/5ML IJ SOLN
INTRAMUSCULAR | Status: DC | PRN
  Administered 2015-11-28 (×5): 2 mg via INTRAVENOUS
  Administered 2015-11-28: 4 mg via INTRAVENOUS

## 2015-11-28 MED ORDER — DEXTROSE 5 % IV SOLN
750.0000 mg | INTRAVENOUS | Status: DC
Start: 2015-11-28 — End: 2015-11-28
  Filled 2015-11-28 (×2): qty 750

## 2015-11-28 MED ORDER — PLASMA-LYTE 148 IV SOLN
INTRAVENOUS | Status: DC
  Filled 2015-11-28 (×2): qty 2.5

## 2015-11-28 MED ORDER — DEXTROSE 5 % IV SOLN
0.0000 ug/min | INTRAVENOUS | Status: DC
  Administered 2015-11-28: 5 ug/min via INTRAVENOUS
  Administered 2015-11-29: 45 ug/min via INTRAVENOUS
  Administered 2015-11-30: 25 ug/min via INTRAVENOUS
  Filled 2015-11-28 (×4): qty 2

## 2015-11-28 MED ORDER — THROMBIN 20000 UNITS EX KIT
PACK | CUTANEOUS | Status: AC
Start: 2015-11-28 — End: 2015-11-28
  Filled 2015-11-28: qty 1

## 2015-11-28 MED ORDER — VERAPAMIL HCL 2.5 MG/ML IV SOLN
INTRAVENOUS | Status: AC
  Filled 2015-11-28: qty 2

## 2015-11-28 MED ORDER — THROMBIN 20000 UNITS EX SOLR
CUTANEOUS | Status: DC | PRN
  Administered 2015-11-28: 20000 [IU] via TOPICAL

## 2015-11-28 MED ORDER — POTASSIUM CHLORIDE 10 MEQ/50ML IV SOLN
10.0000 meq | INTRAVENOUS | Status: AC
  Administered 2015-11-28 (×3): 10 meq via INTRAVENOUS

## 2015-11-28 MED ORDER — MORPHINE SULFATE (PF) 2 MG/ML IV SOLN
2.0000 mg | INTRAVENOUS | Status: DC | PRN
  Administered 2015-11-28 – 2015-11-29 (×3): 2 mg via INTRAVENOUS
  Administered 2015-11-29 – 2015-11-30 (×9): 4 mg via INTRAVENOUS
  Administered 2015-12-01: 2 mg via INTRAVENOUS
  Filled 2015-11-28 (×10): qty 2

## 2015-11-28 MED ORDER — THROMBIN 20000 UNITS EX SOLR: CUTANEOUS | Status: DC | PRN

## 2015-11-28 MED ORDER — PHENYLEPHRINE HCL 10 MG/ML IJ SOLN
10.0000 mg | INTRAVENOUS | Status: DC | PRN
  Administered 2015-11-28: 20 ug/min via INTRAVENOUS

## 2015-11-28 MED ORDER — LIDOCAINE HCL (PF) 1 % IJ SOLN
INTRAMUSCULAR | Status: DC | PRN
  Administered 2015-11-28: 25 mL via INTRADERMAL
  Administered 2015-11-28: 12 mL via SUBCUTANEOUS

## 2015-11-28 MED ORDER — VANCOMYCIN HCL 10 G IV SOLR
1250.0000 mg | INTRAVENOUS | Status: AC
  Administered 2015-11-28: 1250 mg via INTRAVENOUS
  Filled 2015-11-28 (×2): qty 1250

## 2015-11-28 MED ORDER — SODIUM CHLORIDE 0.9 % IV SOLN
INTRAVENOUS | Status: DC
  Administered 2015-11-28: 1.7 [IU]/h via INTRAVENOUS
  Filled 2015-11-28: qty 2.5

## 2015-11-28 MED ORDER — SODIUM CHLORIDE 0.9% FLUSH: 3.0000 mL | INTRAVENOUS | Status: DC | PRN

## 2015-11-28 MED ORDER — LACTATED RINGERS IV SOLN
500.0000 mL | Freq: Once | INTRAVENOUS | Status: AC | PRN
  Administered 2015-11-28: 500 mL via INTRAVENOUS

## 2015-11-28 MED ORDER — IOPAMIDOL (ISOVUE-370) INJECTION 76%
INTRAVENOUS | Status: DC | PRN
  Administered 2015-11-28: 100 mL

## 2015-11-28 MED ORDER — ASPIRIN 81 MG PO CHEW: 324.0000 mg | CHEWABLE_TABLET | Freq: Every day | ORAL | Status: DC

## 2015-11-28 MED ORDER — MIDAZOLAM HCL 10 MG/2ML IJ SOLN
INTRAMUSCULAR | Status: AC
  Filled 2015-11-28: qty 2

## 2015-11-28 MED ORDER — MORPHINE SULFATE (PF) 2 MG/ML IV SOLN
INTRAVENOUS | Status: AC
  Administered 2015-11-28: 2 mg via INTRAVENOUS
  Filled 2015-11-28: qty 1

## 2015-11-28 MED ORDER — MIDAZOLAM HCL 2 MG/2ML IJ SOLN
INTRAMUSCULAR | Status: DC | PRN
  Administered 2015-11-28 (×6): 1 mg via INTRAVENOUS

## 2015-11-28 MED ORDER — EPINEPHRINE HCL 1 MG/ML IJ SOLN
0.0000 ug/min | INTRAVENOUS | Status: DC
  Filled 2015-11-28 (×2): qty 4

## 2015-11-28 MED ORDER — METOCLOPRAMIDE HCL 5 MG/ML IJ SOLN
10.0000 mg | Freq: Four times a day (QID) | INTRAMUSCULAR | Status: AC
  Administered 2015-11-28 – 2015-11-29 (×3): 10 mg via INTRAVENOUS
  Filled 2015-11-28 (×3): qty 2

## 2015-11-28 MED ORDER — IOPAMIDOL (ISOVUE-370) INJECTION 76%
INTRAVENOUS | Status: AC
Start: 2015-11-28 — End: 2015-11-28
  Filled 2015-11-28: qty 50

## 2015-11-28 MED ORDER — BISACODYL 5 MG PO TBEC
10.0000 mg | DELAYED_RELEASE_TABLET | Freq: Every day | ORAL | Status: DC
  Administered 2015-11-29 – 2015-12-01 (×3): 10 mg via ORAL
  Filled 2015-11-28 (×3): qty 2

## 2015-11-28 MED ORDER — ALBUMIN HUMAN 5 % IV SOLN
INTRAVENOUS | Status: DC | PRN
  Administered 2015-11-28: 18:00:00 via INTRAVENOUS

## 2015-11-28 MED ORDER — HEPARIN (PORCINE) IN NACL 2-0.9 UNIT/ML-% IJ SOLN
INTRAMUSCULAR | Status: AC
  Filled 2015-11-28: qty 1000

## 2015-11-28 MED ORDER — ASPIRIN EC 325 MG PO TBEC
325.0000 mg | DELAYED_RELEASE_TABLET | Freq: Every day | ORAL | Status: DC
  Administered 2015-11-29 – 2015-12-01 (×3): 325 mg via ORAL
  Filled 2015-11-28 (×4): qty 1

## 2015-11-28 MED ORDER — ACETAMINOPHEN 650 MG RE SUPP
650.0000 mg | Freq: Once | RECTAL | Status: AC
  Administered 2015-11-28: 650 mg via RECTAL

## 2015-11-28 MED ORDER — PLASMA-LYTE 148 IV SOLN
INTRAVENOUS | Status: DC | PRN
  Administered 2015-11-28: 500 mL via INTRAVASCULAR

## 2015-11-28 MED ORDER — OXYCODONE HCL 5 MG PO TABS
5.0000 mg | ORAL_TABLET | ORAL | Status: DC | PRN
  Administered 2015-11-29: 10 mg via ORAL
  Filled 2015-11-28: qty 2

## 2015-11-28 MED ORDER — FENTANYL CITRATE (PF) 100 MCG/2ML IJ SOLN
INTRAMUSCULAR | Status: DC | PRN
  Administered 2015-11-28 (×6): 25 ug via INTRAVENOUS

## 2015-11-28 MED ORDER — HEPARIN SODIUM (PORCINE) 1000 UNIT/ML IJ SOLN
INTRAMUSCULAR | Status: AC
  Filled 2015-11-28: qty 1

## 2015-11-28 MED ORDER — FAMOTIDINE IN NACL 20-0.9 MG/50ML-% IV SOLN
20.0000 mg | Freq: Two times a day (BID) | INTRAVENOUS | Status: AC
  Administered 2015-11-28 – 2015-11-29 (×2): 20 mg via INTRAVENOUS
  Filled 2015-11-28: qty 50

## 2015-11-28 MED ORDER — METOPROLOL TARTRATE 1 MG/ML IV SOLN
2.5000 mg | INTRAVENOUS | Status: DC | PRN
  Filled 2015-11-28: qty 5

## 2015-11-28 MED ORDER — POTASSIUM CHLORIDE 2 MEQ/ML IV SOLN
80.0000 meq | INTRAVENOUS | Status: DC
  Filled 2015-11-28 (×2): qty 40

## 2015-11-28 MED ORDER — NITROGLYCERIN IN D5W 200-5 MCG/ML-% IV SOLN: 0.0000 ug/min | INTRAVENOUS | Status: DC

## 2015-11-28 MED ORDER — HEPARIN (PORCINE) IN NACL 2-0.9 UNIT/ML-% IJ SOLN
INTRAMUSCULAR | Status: DC | PRN
  Administered 2015-11-28: 12:00:00

## 2015-11-28 MED ORDER — LACTATED RINGERS IV SOLN
INTRAVENOUS | Status: DC | PRN
  Administered 2015-11-28: 13:00:00 via INTRAVENOUS

## 2015-11-28 MED ORDER — FENTANYL CITRATE (PF) 250 MCG/5ML IJ SOLN
INTRAMUSCULAR | Status: AC
Start: 2015-11-28 — End: 2015-11-28
  Filled 2015-11-28: qty 20

## 2015-11-28 MED ORDER — BISACODYL 10 MG RE SUPP: 10.0000 mg | Freq: Every day | RECTAL | Status: DC

## 2015-11-28 MED ORDER — IOPAMIDOL (ISOVUE-370) INJECTION 76%
INTRAVENOUS | Status: AC
  Filled 2015-11-28: qty 100

## 2015-11-28 MED FILL — Heparin Sodium (Porcine) Inj 1000 Unit/ML: INTRAMUSCULAR | Qty: 30 | Status: AC

## 2015-11-28 MED FILL — Potassium Chloride Inj 2 mEq/ML: INTRAVENOUS | Qty: 40 | Status: AC

## 2015-11-28 MED FILL — Magnesium Sulfate Inj 50%: INTRAMUSCULAR | Qty: 10 | Status: AC

## 2015-11-28 SURGICAL SUPPLY — 105 items
BAG DECANTER FOR FLEXI CONT (MISCELLANEOUS) ×2 IMPLANT
BANDAGE ELASTIC 4 VELCRO ST LF (GAUZE/BANDAGES/DRESSINGS) ×2 IMPLANT
BANDAGE ELASTIC 6 VELCRO ST LF (GAUZE/BANDAGES/DRESSINGS) ×2 IMPLANT
BASKET HEART (ORDER IN 25'S) (MISCELLANEOUS) ×1
BASKET HEART (ORDER IN 25S) (MISCELLANEOUS) ×1 IMPLANT
BLADE STERNUM SYSTEM 6 (BLADE) ×2 IMPLANT
BLADE SURG 11 STRL SS (BLADE) ×2 IMPLANT
BNDG GAUZE ELAST 4 BULKY (GAUZE/BANDAGES/DRESSINGS) ×2 IMPLANT
CANISTER SUCTION 2500CC (MISCELLANEOUS) ×2 IMPLANT
CATH ROBINSON RED A/P 18FR (CATHETERS) ×4 IMPLANT
CATH THORACIC 28FR (CATHETERS) ×2 IMPLANT
CATH THORACIC 36FR (CATHETERS) ×2 IMPLANT
CATH THORACIC 36FR RT ANG (CATHETERS) ×2 IMPLANT
CLIP TI MEDIUM 24 (CLIP) IMPLANT
CLIP TI WIDE RED SMALL 24 (CLIP) ×2 IMPLANT
CLIP TI WIDE RED SMALL 6 (CLIP) ×2 IMPLANT
COVER SURGICAL LIGHT HANDLE (MISCELLANEOUS) ×2 IMPLANT
CRADLE DONUT ADULT HEAD (MISCELLANEOUS) ×2 IMPLANT
DERMABOND ADVANCED (GAUZE/BANDAGES/DRESSINGS) ×1
DERMABOND ADVANCED .7 DNX12 (GAUZE/BANDAGES/DRESSINGS) ×1 IMPLANT
DRAPE CARDIOVASCULAR INCISE (DRAPES) ×1
DRAPE SLUSH/WARMER DISC (DRAPES) ×2 IMPLANT
DRAPE SRG 135X102X78XABS (DRAPES) ×1 IMPLANT
DRSG COVADERM 4X14 (GAUZE/BANDAGES/DRESSINGS) ×2 IMPLANT
ELECT CAUTERY BLADE 6.4 (BLADE) ×2 IMPLANT
ELECT REM PT RETURN 9FT ADLT (ELECTROSURGICAL) ×4
ELECTRODE REM PT RTRN 9FT ADLT (ELECTROSURGICAL) ×2 IMPLANT
FELT TEFLON 1X6 (MISCELLANEOUS) ×2 IMPLANT
GAUZE SPONGE 4X4 12PLY STRL (GAUZE/BANDAGES/DRESSINGS) ×4 IMPLANT
GLOVE BIO SURGEON STRL SZ 6 (GLOVE) ×2 IMPLANT
GLOVE BIO SURGEON STRL SZ 6.5 (GLOVE) IMPLANT
GLOVE BIO SURGEON STRL SZ7 (GLOVE) IMPLANT
GLOVE BIO SURGEON STRL SZ7.5 (GLOVE) IMPLANT
GLOVE BIOGEL PI IND STRL 6 (GLOVE) IMPLANT
GLOVE BIOGEL PI IND STRL 6.5 (GLOVE) ×2 IMPLANT
GLOVE BIOGEL PI IND STRL 7.0 (GLOVE) ×2 IMPLANT
GLOVE BIOGEL PI INDICATOR 6 (GLOVE)
GLOVE BIOGEL PI INDICATOR 6.5 (GLOVE) ×2
GLOVE BIOGEL PI INDICATOR 7.0 (GLOVE) ×2
GLOVE ECLIPSE 7.5 STRL STRAW (GLOVE) ×2 IMPLANT
GLOVE EUDERMIC 7 POWDERFREE (GLOVE) ×4 IMPLANT
GLOVE ORTHO TXT STRL SZ7.5 (GLOVE) IMPLANT
GOWN STRL REUS W/ TWL LRG LVL3 (GOWN DISPOSABLE) ×5 IMPLANT
GOWN STRL REUS W/ TWL XL LVL3 (GOWN DISPOSABLE) ×1 IMPLANT
GOWN STRL REUS W/TWL LRG LVL3 (GOWN DISPOSABLE) ×5
GOWN STRL REUS W/TWL XL LVL3 (GOWN DISPOSABLE) ×1
HEMOSTAT POWDER SURGIFOAM 1G (HEMOSTASIS) ×6 IMPLANT
HEMOSTAT SURGICEL 2X14 (HEMOSTASIS) ×2 IMPLANT
INSERT FOGARTY 61MM (MISCELLANEOUS) IMPLANT
INSERT FOGARTY XLG (MISCELLANEOUS) IMPLANT
KIT BASIN OR (CUSTOM PROCEDURE TRAY) ×2 IMPLANT
KIT CATH CPB BARTLE (MISCELLANEOUS) ×2 IMPLANT
KIT ROOM TURNOVER OR (KITS) ×2 IMPLANT
KIT SUCTION CATH 14FR (SUCTIONS) ×2 IMPLANT
KIT VASOVIEW 6 PRO VH 2400 (KITS) ×2 IMPLANT
NS IRRIG 1000ML POUR BTL (IV SOLUTION) ×10 IMPLANT
PACK OPEN HEART (CUSTOM PROCEDURE TRAY) ×2 IMPLANT
PAD ARMBOARD 7.5X6 YLW CONV (MISCELLANEOUS) ×4 IMPLANT
PAD ELECT DEFIB RADIOL ZOLL (MISCELLANEOUS) ×2 IMPLANT
PENCIL BUTTON HOLSTER BLD 10FT (ELECTRODE) ×2 IMPLANT
PUNCH AORTIC ROTATE  4.5MM 8IN (MISCELLANEOUS) ×2 IMPLANT
PUNCH AORTIC ROTATE 4.0MM (MISCELLANEOUS) IMPLANT
PUNCH AORTIC ROTATE 4.5MM 8IN (MISCELLANEOUS) ×2 IMPLANT
PUNCH AORTIC ROTATE 5MM 8IN (MISCELLANEOUS) IMPLANT
SET CARDIOPLEGIA MPS 5001102 (MISCELLANEOUS) ×2 IMPLANT
SOLUTION ANTI FOG 6CC (MISCELLANEOUS) ×2 IMPLANT
SPONGE GAUZE 4X4 12PLY STER LF (GAUZE/BANDAGES/DRESSINGS) ×4 IMPLANT
SPONGE INTESTINAL PEANUT (DISPOSABLE) IMPLANT
SPONGE LAP 18X18 X RAY DECT (DISPOSABLE) IMPLANT
SPONGE LAP 4X18 X RAY DECT (DISPOSABLE) ×2 IMPLANT
SUT BONE WAX W31G (SUTURE) ×2 IMPLANT
SUT MNCRL AB 4-0 PS2 18 (SUTURE) ×2 IMPLANT
SUT PROLENE 3 0 SH DA (SUTURE) IMPLANT
SUT PROLENE 3 0 SH1 36 (SUTURE) ×2 IMPLANT
SUT PROLENE 4 0 RB 1 (SUTURE)
SUT PROLENE 4 0 SH DA (SUTURE) IMPLANT
SUT PROLENE 4-0 RB1 .5 CRCL 36 (SUTURE) IMPLANT
SUT PROLENE 5 0 C 1 36 (SUTURE) IMPLANT
SUT PROLENE 6 0 C 1 30 (SUTURE) ×2 IMPLANT
SUT PROLENE 7 0 BV 1 (SUTURE) IMPLANT
SUT PROLENE 7 0 BV1 MDA (SUTURE) ×4 IMPLANT
SUT PROLENE 8 0 BV175 6 (SUTURE) IMPLANT
SUT SILK  1 MH (SUTURE)
SUT SILK 1 MH (SUTURE) IMPLANT
SUT STEEL 6MS V (SUTURE) ×4 IMPLANT
SUT STEEL STERNAL CCS#1 18IN (SUTURE) IMPLANT
SUT STEEL SZ 6 DBL 3X14 BALL (SUTURE) IMPLANT
SUT VIC AB 1 CTX 36 (SUTURE) ×2
SUT VIC AB 1 CTX36XBRD ANBCTR (SUTURE) ×2 IMPLANT
SUT VIC AB 2-0 CT1 27 (SUTURE) ×1
SUT VIC AB 2-0 CT1 TAPERPNT 27 (SUTURE) ×1 IMPLANT
SUT VIC AB 2-0 CTX 27 (SUTURE) IMPLANT
SUT VIC AB 3-0 SH 27 (SUTURE)
SUT VIC AB 3-0 SH 27X BRD (SUTURE) IMPLANT
SUT VIC AB 3-0 X1 27 (SUTURE) IMPLANT
SUT VICRYL 4-0 PS2 18IN ABS (SUTURE) IMPLANT
SUTURE E-PAK OPEN HEART (SUTURE) ×2 IMPLANT
SYSTEM SAHARA CHEST DRAIN ATS (WOUND CARE) ×2 IMPLANT
TAPE CLOTH SURG 4X10 WHT LF (GAUZE/BANDAGES/DRESSINGS) ×4 IMPLANT
TOWEL OR 17X24 6PK STRL BLUE (TOWEL DISPOSABLE) ×2 IMPLANT
TOWEL OR 17X26 10 PK STRL BLUE (TOWEL DISPOSABLE) ×2 IMPLANT
TRAY FOLEY IC TEMP SENS 16FR (CATHETERS) ×2 IMPLANT
TUBING INSUFFLATION (TUBING) ×2 IMPLANT
UNDERPAD 30X30 INCONTINENT (UNDERPADS AND DIAPERS) ×2 IMPLANT
WATER STERILE IRR 1000ML POUR (IV SOLUTION) ×4 IMPLANT

## 2015-11-28 SURGICAL SUPPLY — 20 items
BALLN EMERGE MR 2.5X15 (BALLOONS) ×3
BALLN LINEAR 7.5FR IABP 40CC (BALLOONS) ×3
BALLOON EMERGE MR 2.5X15 (BALLOONS) ×2 IMPLANT
BALLOON LINEAR 7.5FR IABP 40CC (BALLOONS) ×2 IMPLANT
CATH INFINITI JR4 5F (CATHETERS) ×3 IMPLANT
DEVICE SECURE STATLOCK IABP (MISCELLANEOUS) ×6 IMPLANT
ELECT DEFIB PAD ADLT CADENCE (PAD) ×3 IMPLANT
GLIDESHEATH SLEND SS 6F .021 (SHEATH) ×3 IMPLANT
GUIDE CATH RUNWAY 6FR CLS3.5 (CATHETERS) ×3 IMPLANT
KIT ENCORE 26 ADVANTAGE (KITS) ×3 IMPLANT
KIT HEART LEFT (KITS) ×3 IMPLANT
PACK CARDIAC CATHETERIZATION (CUSTOM PROCEDURE TRAY) ×3 IMPLANT
SHEATH PINNACLE 6F 10CM (SHEATH) ×3 IMPLANT
TRANSDUCER W/STOPCOCK (MISCELLANEOUS) ×6 IMPLANT
TUBING CIL FLEX 10 FLL-RA (TUBING) ×3 IMPLANT
VALVE GUARDIAN II ~~LOC~~ HEMO (MISCELLANEOUS) ×3 IMPLANT
WIRE ASAHI PROWATER 180CM (WIRE) ×3 IMPLANT
WIRE EMERALD 3MM-J .035X150CM (WIRE) ×3 IMPLANT
WIRE HI TORQ BMW 190CM (WIRE) ×3 IMPLANT
WIRE SAFE-T 1.5MM-J .035X260CM (WIRE) ×3 IMPLANT

## 2015-11-28 NOTE — Anesthesia Preprocedure Evaluation (Addendum)
Anesthesia Evaluation  Patient identified by MRN, date of birth, ID band Patient awake and Patient confused    Reviewed: Allergy & Precautions, NPO status , Patient's Chart, lab work & pertinent test results  Airway Mallampati: I  TM Distance: >3 FB Neck ROM: Full    Dental  (+) Edentulous Upper, Edentulous Lower   Pulmonary Current Smoker,    breath sounds clear to auscultation       Cardiovascular  Rhythm:Regular Rate:Normal     Neuro/Psych    GI/Hepatic   Endo/Other    Renal/GU      Musculoskeletal   Abdominal   Peds  Hematology   Anesthesia Other Findings   Reproductive/Obstetrics                            Anesthesia Physical Anesthesia Plan  ASA: IV and emergent  Anesthesia Plan:    Post-op Pain Management:    Induction: Intravenous  Airway Management Planned: Oral ETT  Additional Equipment: Arterial line, CVP, PA Cath and Ultrasound Guidance Line Placement  Intra-op Plan:   Post-operative Plan: Post-operative intubation/ventilation  Informed Consent: I have reviewed the patients History and Physical, chart, labs and discussed the procedure including the risks, benefits and alternatives for the proposed anesthesia with the patient or authorized representative who has indicated his/her understanding and acceptance.     Plan Discussed with: CRNA and Anesthesiologist  Anesthesia Plan Comments:        Anesthesia Quick Evaluation

## 2015-11-28 NOTE — Progress Notes (Signed)
      [address]       [place] [address]             [phone]      S/p emergency CABG  Intubated, sedated  BP 144/89 mmHg  Pulse 99  Temp(Src) 98.4 F (36.9 C) (Core (Comment))  Resp 19  Wt 175 lb 0.7 oz (79.4 kg)  SpO2 99%  21/14 CO= 4.38   Intake/Output Summary (Last 24 hours) at 11/28/15 1950 Last data filed at 11/28/15 1900  Gross per 24 hour  Intake   2286 ml  Output   3065 ml  Net   -779 ml   Doing well early postop emergency CABG  Remo Lipps C. Roxan Hockey, MD [facility] [phone]

## 2015-11-28 NOTE — H&P (Signed)
Patient ID: [name] MRN: [id number], DOB/AGE: August 11, 1956   Admit date: 11/28/2015   Primary Physician: [name], MD Primary Cardiologist: New (Dr. Irish Lack)  Pt. Profile:  60 y/o male with h/o tobacco abuse Hep C and esophagitis, but no prior cardiac history, presenting to [facility] with CP/ acute STEMI.    Problem List  Past Medical History  Diagnosis Date  . Neck pain   . Anxiety   . Esophageal reflux     eosinophil esophagitis  . Acute hepatitis C without mention of hepatic coma     Tx by [facility] for Hep.  negative for hep c 2/15.  . Colitis     Past Surgical History  Procedure Laterality Date  . Shoulder surgery       Allergies  Allergies  Allergen Reactions  . Prednisone Other (See Comments)    States that this med makes him "crazy"  . Tetanus Toxoids Swelling and Other (See Comments)    Fever, Swelling of the arm   . Wellbutrin [Bupropion] Other (See Comments)    Crazy thoughts, nightmares  . Chantix [Varenicline] Other (See Comments)    Dreams    HPI  60 y/o male with h/o tobacco abuse, hepatitis C and eosinophil esophagitis, but no prior cardiac history, presenting to [facility] with CP/ acute anterior STEMI.  There is no known h/o HTN, HLD or DM.    Symptoms started abruptly at work. Occurred at rest. He performs desk work. He noted severe substernal chest discomfort radiating across his chest into his left arm. He also felt SOB. He initially thought it was gas and took pepto. However symptoms failed to improve, prompting his co-workers to call EMS. On EMS arrival, EKG showed anterior ST elevations. He was given 324 mg of ASA, 2 SL NTG, morphine and Zofran for nausea.  On arrival to [facility], he was in stable condition and taken to cath lab for emergent LHC.    Home Medications  Prior to Admission medications   Medication Sig Start Date End Date Taking? Authorizing Provider  chlordiazePOXIDE (LIBRIUM) 25 MG capsule Take 12.5-25 mg by mouth 3 (three) times  daily as needed for anxiety.     Historical Provider, MD  colchicine 0.6 MG tablet Take 1 tablet (0.6 mg total) by mouth daily. 03/08/15   [name], MD  guaiFENesin (MUCINEX) 600 MG 12 hr tablet Take 600 mg by mouth at bedtime as needed for cough or to loosen phlegm.    Historical Provider, MD  Multiple Vitamins-Minerals (ONE-A-DAY MENS 50+ ADVANTAGE PO) Take 2 tablets by mouth daily.     Historical Provider, MD  traZODone (DESYREL) 50 MG tablet Take 50 mg by mouth at bedtime.     Historical Provider, MD  zolpidem (AMBIEN) 10 MG tablet Take 10 mg by mouth at bedtime as needed for sleep.  03/04/15   Historical Provider, MD    Family History  Family History  Problem Relation Age of Onset  . Autoimmune disease Neg Hx   . Lung cancer Mother   . Heart Problems Father     Social History  Social History   Social History  . Marital Status: Married    Spouse Name: [name]  . Number of Children: 3  . Years of Education: College   Occupational History  . Self-employed    Social History Main Topics  . Smoking status: Current Every Day Smoker -- 0.50 packs/day    Types: Cigarettes  . Smokeless tobacco: Never Used  .  Alcohol Use: No  . Drug Use: No  . Sexual Activity: Not on file   Other Topics Concern  . Not on file   Social History Narrative   Patient lives at home with his spouse.   Caffeine Use: yes     Review of Systems General:  No chills, fever, night sweats or weight changes.  Cardiovascular:  No chest pain, dyspnea on exertion, edema, orthopnea, palpitations, paroxysmal nocturnal dyspnea. Dermatological: No rash, lesions/masses Respiratory: No cough, dyspnea Urologic: No hematuria, dysuria Abdominal:   No nausea, vomiting, diarrhea, bright red blood per rectum, melena, or hematemesis Neurologic:  No visual changes, wkns, changes in mental status. All other systems reviewed and are otherwise negative except as noted above.  Physical Exam  SpO2 99 %.  General:  A&O, NAD Psych: Normal affect. Neuro: Alert and oriented X 3. Moves all extremities spontaneously. HEENT: Normal  Neck: Supple without bruits or JVD. Lungs:  Resp regular and unlabored, CTA. Heart: RRR no s3, s4, or murmurs. Abdomen: Soft, non-tender, non-distended, BS + x 4.  Extremities: No clubbing, cyanosis or edema. DP/PT/Radials 2+ and equal bilaterally.  Labs  Troponin (Point of Care Test) No results for input(s): TROPIPOC in the last 72 hours. No results for input(s): CKTOTAL, CKMB, TROPONINI in the last 72 hours. Lab Results  Component Value Date   WBC 9.3 03/07/2015   HGB 16.0 03/07/2015   HCT 47.7 03/07/2015   MCV 93.9 03/07/2015   PLT 227 03/07/2015   No results for input(s): NA, K, CL, CO2, BUN, CREATININE, CALCIUM, PROT, BILITOT, ALKPHOS, ALT, AST, GLUCOSE in the last 168 hours.  Invalid input(s): LABALBU No results found for: CHOL, HDL, LDLCALC, TRIG No results found for: DDIMER   Radiology/Studies  Dg Chest 2 View  11/14/2015  CLINICAL DATA:  Cough for 3 days EXAM: CHEST  2 VIEW COMPARISON:  03/07/2015 FINDINGS: Normal heart size. Lungs clear. No pneumothorax. No pleural effusion. IMPRESSION: No active cardiopulmonary disease. Electronically Signed   By: Marybelle Killings M.D.   On: 11/14/2015 11:18    ECG  Acute anterior ST elevations.    ASSESSMENT AND PLAN Principal Problem:   STEMI (ST elevation myocardial infarction) ([place]) Active Problems:   Hepatitis C   Tobacco abuse   CAD (coronary artery disease), native coronary artery   1. Acute STEMI: emergent LHC showed severe ostial LAD and ostial LCx disease. Will need CABG. Balloon angioplasty was performed on LAD to improve flow to help stabilize until surgical revascularization can be performed. Balloon pump also inserted for stabilization measures.  Will consult CT surgery.  Will need to screen for DLD and DM this admission and treat accordingly.  2. Tobacco Abuse: pt will need education on smoking  cessation.    Signed, Lyda Jester, PA-C 11/28/2015, 10:31 AM   I have examined the patient and reviewed assessment and plan and discussed with patient.  Agree with above as stated.  I personally reviewed his ECG and made the decision that he needs emergent cardiac cath. S/p PTCA of LAD.  He stabilized after this.  Due to lack of OR availability, he waited in the cath lab procedure room with mae and the full staff for some time but was very stable throughout.   He is now in the OR.  Will need to stop smoking.  He will need a statin as well.    Kyreese Chio S.

## 2015-11-28 NOTE — Consult Note (Signed)
[address]       [place] [address]             [phone]      Cardiothoracic Surgery Consultation  Reason for Consult: Severe multivessel coronary disease and acute anterior STEMI due to LAD/D occlusion Referring Physician: Dr. [name] [name] is an 60 y.o. male.  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 The pain radiated to left arm and associated with shortness of breath. Brought in by EMS with ECG showing anterior ST elevation. Taken to cath lab emergently in stable condition. Cath shows ostial/proximal LAD/diagonal occlusion with a tight ostial/proximal LCX stenosis and significant RCA disease. No LV gram done. The LAD/D were opened with PTCA to reestablish flow but this area is severely diseased and wires left in on Angiomax. IABP put in empirically for anatomy. His ST elevations resolved with opening the vessel and BP has been stable.  Past Medical History  Diagnosis Date  . Neck pain   . Anxiety   . Esophageal reflux     eosinophil esophagitis  . Acute hepatitis C without mention of hepatic coma     Tx by [facility] for Hep.  negative for hep c 2/15.  . Colitis     Past Surgical History  Procedure Laterality Date  . Shoulder surgery      Family History  Problem Relation Age of Onset  . Autoimmune disease Neg Hx   . Lung cancer Mother   . Heart Problems Father     Social History:  reports that he has been smoking Cigarettes.  He has been smoking about 0.50 packs per day. He has never used smokeless tobacco. He reports that he does not drink alcohol or use illicit drugs.  Allergies:  Allergies  Allergen Reactions  . Prednisone Other (See Comments)    States that this med makes him "crazy"  . Tetanus Toxoids Swelling and Other (See Comments)    Fever, Swelling of the arm   . Wellbutrin [Bupropion]  Other (See Comments)    Crazy thoughts, nightmares  . Chantix [Varenicline] Other (See Comments)    Dreams    Medications:  I have reviewed the patient's current medications. Prior to Admission:  Prescriptions prior to admission  Medication Sig Dispense Refill Last Dose  . chlordiazePOXIDE (LIBRIUM) 25 MG capsule Take 12.5-25 mg by mouth 3 (three) times daily as needed for anxiety.    03/07/2015 at Unknown time  . colchicine 0.6 MG tablet Take 1 tablet (0.6 mg total) by mouth daily. 20 tablet 0   . guaiFENesin (MUCINEX) 600 MG 12 hr tablet Take 600 mg by mouth at bedtime as needed for cough or to loosen phlegm.   03/06/2015 at Unknown time  . Multiple Vitamins-Minerals (ONE-A-DAY MENS 50+ ADVANTAGE PO) Take 2 tablets by mouth daily.    03/07/2015 at Unknown time  . traZODone (DESYREL) 50 MG tablet Take 50 mg by mouth at bedtime.    03/06/2015 at Unknown time  . zolpidem (AMBIEN) 10 MG tablet Take 10 mg by mouth at bedtime as needed for sleep.   0 03/06/2015 at Unknown time   Scheduled: . aminocaproic acid (AMICAR) for OHS   Intravenous To OR  . cefUROXime (ZINACEF)  IV  1.5 g Intravenous To OR  . cefUROXime (ZINACEF)  IV  750 mg Intravenous To OR  . dexmedetomidine  0.1-0.7 mcg/kg/hr Intravenous To OR  . DOPamine  0-10 mcg/kg/min Intravenous To OR  . epinephrine  0-10 mcg/min Intravenous To OR  . heparin-papaverine-plasmalyte irrigation   Irrigation To OR  . heparin 30,000 units/NS 1000 mL solution for CELLSAVER   Other To OR  . insulin (NOVOLIN-R) infusion   Intravenous To OR  . magnesium sulfate  40 mEq Other To OR  . nitroGLYCERIN  2-200 mcg/min Intravenous To OR  . phenylephrine (NEO-SYNEPHRINE) Adult infusion  30-200 mcg/min Intravenous To OR  . potassium chloride  80 mEq Other To OR  . vancomycin  1,250 mg Intravenous To OR   Continuous: . bivalirudin (ANGIOMAX) infusion 5 mg/mL 1.75 mg/kg/hr (11/28/15 1107)   GYB:WLSLHTDSKAJ (ANGIOMAX) infusion 5 mg/mL, bivalirudin, fentaNYL, cath  procedure set-up drugs (heparinized saline/lidocaine/nitro), iopamidol, lidocaine (PF), midazolam Anti-infectives    Start     Dose/Rate Route Frequency Ordered Stop   11/28/15 1200  vancomycin (VANCOCIN) 1,250 mg in sodium chloride 0.9 % 250 mL IVPB     1,250 mg 166.7 mL/hr over 90 Minutes Intravenous To Surgery 11/28/15 1156 11/29/15 1200   11/28/15 1200  cefUROXime (ZINACEF) 1.5 g in dextrose 5 % 50 mL IVPB     1.5 g 100 mL/hr over 30 Minutes Intravenous To Surgery 11/28/15 1156 11/29/15 1200   11/28/15 1200  cefUROXime (ZINACEF) 750 mg in dextrose 5 % 50 mL IVPB     750 mg 100 mL/hr over 30 Minutes Intravenous To Surgery 11/28/15 1156 11/29/15 1200      Results for orders placed or performed during the hospital encounter of 11/28/15 (from the past 48 hour(s))  CBC     Status: None   Collection Time: 11/28/15 10:10 AM  Result Value Ref Range   WBC 9.9 4.0 - 10.5 K/uL   RBC 4.79 4.22 - 5.81 MIL/uL   Hemoglobin 14.3 13.0 - 17.0 g/dL   HCT 43.0 39.0 - 52.0 %   MCV 89.8 78.0 - 100.0 fL   MCH 29.9 26.0 - 34.0 pg   MCHC 33.3 30.0 - 36.0 g/dL   RDW 13.0 11.5 - 15.5 %   Platelets 256 150 - 400 K/uL  Differential     Status: None   Collection Time: 11/28/15 10:10 AM  Result Value Ref Range   Neutrophils Relative % 76 %   Neutro Abs 7.5 1.7 - 7.7 K/uL   Lymphocytes Relative 18 %   Lymphs Abs 1.8 0.7 - 4.0 K/uL   Monocytes Relative 6 %   Monocytes Absolute 0.6 0.1 - 1.0 K/uL   Eosinophils Relative 0 %   Eosinophils Absolute 0.0 0.0 - 0.7 K/uL   Basophils Relative 0 %   Basophils Absolute 0.0 0.0 - 0.1 K/uL  Protime-INR     Status: None   Collection Time: 11/28/15 10:10 AM  Result Value Ref Range   Prothrombin Time 13.2 11.6 - 15.2 seconds   INR 0.98 0.00 - 1.49  APTT     Status: None   Collection Time: 11/28/15 10:10 AM  Result Value Ref Range   aPTT 37 24 - 37 seconds    Comment:        IF BASELINE aPTT IS ELEVATED, SUGGEST PATIENT RISK ASSESSMENT BE USED TO DETERMINE  APPROPRIATE ANTICOAGULANT THERAPY.   Comprehensive metabolic panel     Status: Abnormal   Collection Time: 11/28/15 10:10 AM  Result Value Ref Range   Sodium 138 135 - 145  mmol/L   Potassium 3.7 3.5 - 5.1 mmol/L   Chloride 106 101 - 111 mmol/L   CO2 21 (L) 22 - 32 mmol/L   Glucose, Bld 130 (H) 65 - 99 mg/dL   BUN 17 6 - 20 mg/dL   Creatinine, Ser 0.98 0.61 - 1.24 mg/dL   Calcium 9.3 8.9 - 10.3 mg/dL   Total Protein 6.8 6.5 - 8.1 g/dL   Albumin 4.2 3.5 - 5.0 g/dL   AST 26 15 - 41 U/L   ALT 24 17 - 63 U/L   Alkaline Phosphatase 55 38 - 126 U/L   Total Bilirubin 1.1 0.3 - 1.2 mg/dL   GFR calc non Af Amer >60 >60 mL/min   GFR calc Af Amer >60 >60 mL/min    Comment: (NOTE) The eGFR has been calculated using the CKD EPI equation. This calculation has not been validated in all clinical situations. eGFR's persistently <60 mL/min signify possible Chronic Kidney Disease.    Anion gap 11 5 - 15  Lipid panel     Status: Abnormal   Collection Time: 11/28/15 10:10 AM  Result Value Ref Range   Cholesterol 202 (H) 0 - 200 mg/dL   Triglycerides 167 (H) <150 mg/dL   HDL 42 >40 mg/dL   Total CHOL/HDL Ratio 4.8 RATIO   VLDL 33 0 - 40 mg/dL   LDL Cholesterol 127 (H) 0 - 99 mg/dL    Comment:        Total Cholesterol/HDL:CHD Risk Coronary Heart Disease Risk Table                     Men   Women  1/2 Average Risk   3.4   3.3  Average Risk       5.0   4.4  2 X Average Risk   9.6   7.1  3 X Average Risk  23.4   11.0        Use the calculated Patient Ratio above and the CHD Risk Table to determine the patient's CHD Risk.        ATP III CLASSIFICATION (LDL):  <100     mg/dL   Optimal  100-129  mg/dL   Near or Above                    Optimal  130-159  mg/dL   Borderline  160-189  mg/dL   High  >190     mg/dL   Very High   Troponin I (q 6hr x 3)     Status: Abnormal   Collection Time: 11/28/15 10:10 AM  Result Value Ref Range   Troponin I 0.47 (H) <0.031 ng/mL    Comment:          PERSISTENTLY INCREASED TROPONIN VALUES IN THE RANGE OF 0.04-0.49 ng/mL CAN BE SEEN IN:       -UNSTABLE ANGINA       -CONGESTIVE HEART FAILURE       -MYOCARDITIS       -CHEST TRAUMA       -ARRYHTHMIAS       -LATE PRESENTING MYOCARDIAL INFARCTION       -COPD   CLINICAL FOLLOW-UP RECOMMENDED.   I-STAT, chem 8     Status: Abnormal   Collection Time: 11/28/15 10:12 AM  Result Value Ref Range   Sodium 140 135 - 145 mmol/L   Potassium 3.7 3.5 - 5.1 mmol/L   Chloride 104 101 - 111 mmol/L   BUN  19 6 - 20 mg/dL   Creatinine, Ser 0.80 0.61 - 1.24 mg/dL   Glucose, Bld 131 (H) 65 - 99 mg/dL   Calcium, Ion 1.19 1.13 - 1.30 mmol/L   TCO2 22 0 - 100 mmol/L   Hemoglobin 15.6 13.0 - 17.0 g/dL   HCT 46.0 39.0 - 52.0 %  POCT Activated clotting time     Status: None   Collection Time: 11/28/15 10:23 AM  Result Value Ref Range   Activated Clotting Time 343 seconds  Type and screen [place]     Status: None   Collection Time: 11/28/15 10:38 AM  Result Value Ref Range   ABO/RH(D) O POS    Antibody Screen NEG    Sample Expiration 12/01/2015     No results found.  Review of Systems  Constitutional: Positive for malaise/fatigue.  HENT: Negative.   Eyes: Negative.   Respiratory: Positive for shortness of breath.   Cardiovascular: Positive for chest pain. Negative for orthopnea, leg swelling and PND.  Gastrointestinal: Positive for heartburn.       Reflux and esophagitis  Genitourinary: Negative.   Musculoskeletal: Negative.   Skin: Negative.   Neurological: Negative.   Endo/Heme/Allergies: Negative.   Psychiatric/Behavioral:       Bipolar   Blood pressure 112/77, weight 175 lb 0.7 oz (79.4 kg), SpO2 99 %. Physical Exam  Constitutional: He is oriented to person, place, and time. He appears well-developed and well-nourished.  Cardiovascular: Normal rate, regular rhythm, normal heart sounds and intact distal pulses.   No murmur heard. Respiratory: Effort normal and  breath sounds normal. No respiratory distress.  Musculoskeletal: He exhibits no edema.  Neurological: He is alert and oriented to person, place, and time.    Assessment/Plan:  Severe multi-vessel CAD s/p acute anterior STEMI due to LAD/diagnonal occlusion. He has complex disease involving the ostium of the LAD/D, ramus and LCX as well as significant RCA disease. This is best treated with CABG. Will keep the wires down the coronaries on Angiomax and plan CABG as soon as an operating room opens up. I discussed the operative procedure with the patient including alternatives, benefits and risks; including but not limited to bleeding, blood transfusion, infection, stroke, myocardial infarction, graft failure, heart block requiring a permanent pacemaker, organ dysfunction, and death.  [name] understands and agrees to proceed.    Gaye Pollack 11/28/2015, 12:24 PM

## 2015-11-28 NOTE — Progress Notes (Signed)
  Echocardiogram 2D Echocardiogram has been performed.  [name] 11/28/2015, 2:06 PM

## 2015-11-28 NOTE — Anesthesia Procedure Notes (Signed)
Procedure Name: Intubation Date/Time: 11/28/2015 1:47 PM Performed by: [name] Born Pre-anesthesia Checklist: Patient identified, Emergency Drugs available, Suction available, Patient being monitored and Timeout performed Patient Re-evaluated:Patient Re-evaluated prior to inductionOxygen Delivery Method: Circle system utilized Preoxygenation: Pre-oxygenation with 100% oxygen Intubation Type: IV induction Ventilation: Mask ventilation without difficulty and Oral airway inserted - appropriate to patient size Laryngoscope Size: Sabra Heck and 2 Grade View: Grade I Tube type: Oral Tube size: 7.5 mm Number of attempts: 1 Airway Equipment and Method: Stylet Placement Confirmation: ETT inserted through vocal cords under direct vision,  positive ETCO2 and breath sounds checked- equal and bilateral Secured at: 23 cm Tube secured with: Tape Dental Injury: Teeth and Oropharynx as per pre-operative assessment

## 2015-11-28 NOTE — Brief Op Note (Signed)
11/28/2015  4:56 PM  PATIENT:  [name]  60 y.o. male  PRE-OPERATIVE DIAGNOSIS:  Coronary Artery Disease  POST-OPERATIVE DIAGNOSIS:  Coronary Artery Disease  PROCEDURE:  Procedure(s):  CORONARY ARTERY BYPASS GRAFTING x 5 -LIMA to LAD -SVG to DIAGONAL -SEQ SVG to OM1 and RAMUS INTERMEDIATE -SVG to PDA  ENDOSCOPIC HARVEST GREATER SAPHENOUS VEIN  -Right Leg  SURGEON:  Surgeon(s) and Role:    * Gaye Pollack, MD - Primary  PHYSICIAN ASSISTANT: Ellwood Handler PA-C  ANESTHESIA:   general  EBL:  Total I/O In: 1250 [I.V.:1250] Out: 1365 [Urine:1365]  BLOOD ADMINISTERED: CELLSAVER  DRAINS: Left Pleural Chest Tube, Mediastinal Chest Drains   LOCAL MEDICATIONS USED:  NONE  SPECIMEN:  No Specimen  DISPOSITION OF SPECIMEN:  N/A  COUNTS:  YES  TOURNIQUET:  * No tourniquets in log *  DICTATION: .Dragon Dictation  PLAN OF CARE: Admit to inpatient   PATIENT DISPOSITION:  ICU - intubated and hemodynamically stable.   Delay start of Pharmacological VTE agent (>24hrs) due to surgical blood loss or risk of bleeding: yes

## 2015-11-28 NOTE — Transfer of Care (Signed)
Immediate Anesthesia Transfer of Care Note  Patient: Travers Goodley  Procedure(s) Performed: Procedure(s): CORONARY ARTERY BYPASS GRAFTING (CABG) TIMES FIVE USING LEFT INTERNAL MAMMARY ARTERY AND RIGHT GREATER SAPHENOUS,VIEN HARVEATED BY ENDOVIEN, INTRAOPPRATIVE TEE (N/A)  Patient Location: SICU  Anesthesia Type:General  Level of Consciousness: sedated and Patient remains intubated per anesthesia plan  Airway & Oxygen Therapy: Patient remains intubated per anesthesia plan and Patient placed on Ventilator (see vital sign flow sheet for setting)  Post-op Assessment: Report given to RN and Post -op Vital signs reviewed and stable  Post vital signs: Reviewed and stable  Last Vitals:  Filed Vitals:   11/28/15 1301 11/28/15 1305  BP: 143/82 144/89  Pulse: 99 99  Resp: 19 19    Complications: No apparent anesthesia complications

## 2015-11-29 ENCOUNTER — Inpatient Hospital Stay (HOSPITAL_COMMUNITY): Payer: BC Managed Care – PPO

## 2015-11-29 DIAGNOSIS — I25110 Atherosclerotic heart disease of native coronary artery with unstable angina pectoris: Secondary | ICD-10-CM

## 2015-11-29 LAB — POCT I-STAT, CHEM 8
BUN: 9 mg/dL (ref 6–20)
BUN: 12 mg/dL (ref 6–20)
Calcium, Ion: 1.09 mmol/L — ABNORMAL LOW (ref 1.13–1.30)
Calcium, Ion: 1.14 mmol/L (ref 1.13–1.30)
Chloride: 104 mmol/L (ref 101–111)
Chloride: 104 mmol/L (ref 101–111)
Creatinine, Ser: 0.7 mg/dL (ref 0.61–1.24)
Creatinine, Ser: 0.9 mg/dL (ref 0.61–1.24)
Glucose, Bld: 148 mg/dL — ABNORMAL HIGH (ref 65–99)
Glucose, Bld: 110 mg/dL — ABNORMAL HIGH (ref 65–99)
HCT: 30 % — ABNORMAL LOW (ref 39.0–52.0)
HCT: 31 % — ABNORMAL LOW (ref 39.0–52.0)
Hemoglobin: 10.2 g/dL — ABNORMAL LOW (ref 13.0–17.0)
Hemoglobin: 10.5 g/dL — ABNORMAL LOW (ref 13.0–17.0)
Potassium: 4.3 mmol/L (ref 3.5–5.1)
Potassium: 4.2 mmol/L (ref 3.5–5.1)
Sodium: 140 mmol/L (ref 135–145)
Sodium: 139 mmol/L (ref 135–145)
TCO2: 24 mmol/L (ref 0–100)
TCO2: 23 mmol/L (ref 0–100)

## 2015-11-29 LAB — MAGNESIUM
Magnesium: 3.2 mg/dL — ABNORMAL HIGH (ref 1.7–2.4)
Magnesium: 2.5 mg/dL — ABNORMAL HIGH (ref 1.7–2.4)

## 2015-11-29 LAB — POCT I-STAT 3, ART BLOOD GAS (G3+)
Acid-Base Excess: 3 mmol/L — ABNORMAL HIGH (ref 0.0–2.0)
Acid-base deficit: 1 mmol/L (ref 0.0–2.0)
Bicarbonate: 25.1 mEq/L — ABNORMAL HIGH (ref 20.0–24.0)
Bicarbonate: 27.7 mEq/L — ABNORMAL HIGH (ref 20.0–24.0)
O2 Saturation: 98 %
O2 Saturation: 98 %
Patient temperature: 36.7
Patient temperature: 36.7
TCO2: 26 mmol/L (ref 0–100)
TCO2: 29 mmol/L (ref 0–100)
pCO2 arterial: 43.1 mmHg (ref 35.0–45.0)
pCO2 arterial: 45.3 mmHg — ABNORMAL HIGH (ref 35.0–45.0)
pH, Arterial: 7.35 (ref 7.350–7.450)
pH, Arterial: 7.415 (ref 7.350–7.450)
pO2, Arterial: 105 mmHg — ABNORMAL HIGH (ref 80.0–100.0)
pO2, Arterial: 110 mmHg — ABNORMAL HIGH (ref 80.0–100.0)

## 2015-11-29 LAB — CBC
HCT: 29.5 % — ABNORMAL LOW (ref 39.0–52.0)
HCT: 29.6 % — ABNORMAL LOW (ref 39.0–52.0)
Hemoglobin: 9.6 g/dL — ABNORMAL LOW (ref 13.0–17.0)
Hemoglobin: 9.8 g/dL — ABNORMAL LOW (ref 13.0–17.0)
MCH: 29.6 pg (ref 26.0–34.0)
MCH: 30.4 pg (ref 26.0–34.0)
MCHC: 32.5 g/dL (ref 30.0–36.0)
MCHC: 33.1 g/dL (ref 30.0–36.0)
MCV: 91 fL (ref 78.0–100.0)
MCV: 91.9 fL (ref 78.0–100.0)
Platelets: 130 10*3/uL — ABNORMAL LOW (ref 150–400)
Platelets: 133 10*3/uL — ABNORMAL LOW (ref 150–400)
RBC: 3.24 MIL/uL — ABNORMAL LOW (ref 4.22–5.81)
RBC: 3.22 MIL/uL — ABNORMAL LOW (ref 4.22–5.81)
RDW: 13.1 % (ref 11.5–15.5)
RDW: 13.3 % (ref 11.5–15.5)
WBC: 13.4 10*3/uL — ABNORMAL HIGH (ref 4.0–10.5)
WBC: 15.7 10*3/uL — ABNORMAL HIGH (ref 4.0–10.5)

## 2015-11-29 LAB — CREATININE, SERUM
Creatinine, Ser: 0.99 mg/dL (ref 0.61–1.24)
GFR calc Af Amer: >60 mL/min (ref >60)
GFR calc non Af Amer: >60 mL/min (ref >60)

## 2015-11-29 LAB — BASIC METABOLIC PANEL
Anion gap: 5 (ref 5–15)
BUN: 8 mg/dL (ref 6–20)
CO2: 24 mmol/L (ref 22–32)
Calcium: 7.8 mg/dL — ABNORMAL LOW (ref 8.9–10.3)
Chloride: 110 mmol/L (ref 101–111)
Creatinine, Ser: 0.81 mg/dL (ref 0.61–1.24)
GFR calc Af Amer: >60 mL/min (ref >60)
GFR calc non Af Amer: >60 mL/min (ref >60)
Glucose, Bld: 108 mg/dL — ABNORMAL HIGH (ref 65–99)
Potassium: 4.2 mmol/L (ref 3.5–5.1)
Sodium: 139 mmol/L (ref 135–145)

## 2015-11-29 LAB — GLUCOSE, CAPILLARY
Glucose-Capillary: 101 mg/dL — ABNORMAL HIGH (ref 65–99)
Glucose-Capillary: 107 mg/dL — ABNORMAL HIGH (ref 65–99)
Glucose-Capillary: 109 mg/dL — ABNORMAL HIGH (ref 65–99)
Glucose-Capillary: 110 mg/dL — ABNORMAL HIGH (ref 65–99)
Glucose-Capillary: 118 mg/dL — ABNORMAL HIGH (ref 65–99)
Glucose-Capillary: 120 mg/dL — ABNORMAL HIGH (ref 65–99)
Glucose-Capillary: 126 mg/dL — ABNORMAL HIGH (ref 65–99)
Glucose-Capillary: 130 mg/dL — ABNORMAL HIGH (ref 65–99)
Glucose-Capillary: 139 mg/dL — ABNORMAL HIGH (ref 65–99)
Glucose-Capillary: 90 mg/dL (ref 65–99)
Glucose-Capillary: 91 mg/dL (ref 65–99)
Glucose-Capillary: 93 mg/dL (ref 65–99)
Glucose-Capillary: 83 mg/dL (ref 65–99)
Glucose-Capillary: 98 mg/dL (ref 65–99)
Glucose-Capillary: 129 mg/dL — ABNORMAL HIGH (ref 65–99)

## 2015-11-29 LAB — POCT ACTIVATED CLOTTING TIME: Activated Clotting Time: 162 seconds

## 2015-11-29 LAB — HEMOGLOBIN A1C
Hgb A1c MFr Bld: 5.4 % (ref 4.8–5.6)
Mean Plasma Glucose: 108 mg/dL

## 2015-11-29 MED ORDER — GI COCKTAIL ~~LOC~~
30.0000 mL | Freq: Three times a day (TID) | ORAL | Status: DC | PRN
  Administered 2015-11-29 – 2015-11-30 (×3): 30 mL via ORAL
  Filled 2015-11-29 (×5): qty 30

## 2015-11-29 MED ORDER — OXYCODONE HCL 5 MG PO TABS
5.0000 mg | ORAL_TABLET | ORAL | Status: DC | PRN
  Administered 2015-11-29: 15 mg via ORAL
  Administered 2015-11-29: 10 mg via ORAL
  Administered 2015-11-29: 5 mg via ORAL
  Administered 2015-11-30: 15 mg via ORAL
  Filled 2015-11-29 (×2): qty 3
  Filled 2015-11-29: qty 1
  Filled 2015-11-29: qty 2

## 2015-11-29 MED ORDER — INSULIN ASPART 100 UNIT/ML ~~LOC~~ SOLN
0.0000 [IU] | SUBCUTANEOUS | Status: DC
  Administered 2015-11-29: 2 [IU] via SUBCUTANEOUS

## 2015-11-29 MED ORDER — INSULIN DETEMIR 100 UNIT/ML ~~LOC~~ SOLN
10.0000 [IU] | Freq: Once | SUBCUTANEOUS | Status: DC
  Filled 2015-11-29: qty 0.1

## 2015-11-29 MED ORDER — INSULIN DETEMIR 100 UNIT/ML ~~LOC~~ SOLN: 10.0000 [IU] | Freq: Every day | SUBCUTANEOUS | Status: DC

## 2015-11-29 MED ORDER — INSULIN ASPART 100 UNIT/ML ~~LOC~~ SOLN: 0.0000 [IU] | SUBCUTANEOUS | Status: DC

## 2015-11-29 NOTE — Progress Notes (Signed)
1100 To 2S207 for IABP and R 6 fr Sheath removal. Pt. Alert and orientated x 4, Pre L FA site (IABP ) assessment level 0. 7.5 FR IABP removed with heme bleed back. Manual pressure maintained x 30 min. L dp+ with doppler pre and post sheath removal and marked .Pt remained hemodynamically stable and talking throughout hold. HR 104-108 STACH, BP 119/50-60s, RR 12 . Hemostasis achieved to LFA site. A dry, sterile dressing applied to LFA site,- site post hold level 0. Colletta Maryland, RN at bedside and observed LFA site. Pt given instructions"if laugh, cough, sneeze or bear down must hold pressure and Keep lle straight. Pt. Verbally acknowledge those instructions as he repeats thme back to me. Pt. Also made aware if p[ain, warm , wet. Sticky feeling between legs to call for help. Pt. Verbally acknowledges understanding as they are repeated back to me.   12 noon: Pt. Hemodynamically stable HR 104 STACH bp111/54.6 fr sheath observed at rfa site. Level 0 prior to manual hold. 6 fr sheath removed, manual pressure held x 30 min. R Dp + pre and post removal with doppler and marked,With hemostasis achieved. Post hold groin level 0- Stephanie,RN observed. Pt advised the same"laugh,cough, sneeze, or bear down hold x 2 groins". Pt. Again advised if wet, warm sticky sensation between legs call for RN . Pt. Again, verbally acknowledges understanding of these instructions as he repeats them back to me. At this time wife, daughter at bedside as well. SR uop x2 bed in lowest positoin- pt and groins stable.

## 2015-11-29 NOTE — Progress Notes (Signed)
Anesthesiology Follow-up:  Awake and alert, neuro intact, complaining of incisional chest pain.   Hemodynamically stable with IABP in place.  VS: T- 37.6 BP 103/63 RR- 15 HR 88 ( atrially paced) O2 Sat 100% on 4L   PA 31/18 CO/CI- 5.6/2.9  K- 4.2 BUN/Cr 8/0.81 glucose - 109 H/H 9.6/29.6 platelets 130,000  Extubated 4 1/2 hours post-op.  60 year old male S/P anterior wall Stemi with complex ostial LAD and LCx occlusion. . Now one day S/P CABG X 5. Severe LV dysfunction noted on intra-op TEE. Doing well at present. Plan to remove IABP today.  Roberts Gaudy

## 2015-11-29 NOTE — Procedures (Signed)
Extubation Procedure Note  Patient Details:   Name: [name] DOB: 12/20/1955 MRN: [id number]   Airway Documentation:  Airway 7.5 mm (Active)  Secured at (cm) 23 cm 11/29/2015 12:41 AM  Measured From Lips 11/29/2015 12:41 AM  Secured Location Right 11/29/2015 12:41 AM  Secured By [employer] 11/29/2015 12:41 AM  Tube Holder Repositioned Yes 11/29/2015 12:41 AM  Site Condition Dry 11/29/2015 12:17 AM    Evaluation  O2 sats: stable throughout Complications: No apparent complications Patient did tolerate procedure well. Bilateral Breath Sounds: Clear, Diminished  Patient extubated and placed on 5lpm nasal cannula. NIF=-30 with Vital Capacity=.[phone] No stridor heard over upper airways.  Yes  [name] 11/29/2015, 1:05 AM

## 2015-11-29 NOTE — CV Procedure (Signed)
Intra-operative Transesophageal Echocardiography:  [name] is a 60 year old male with history of hepatitis C, eosinophilic esophagitis, and smoking who presented to the [facility]  Emergency room on 11/28/2015 with the acute onset of chest pain and he was found to have an acute STEMI involving the anterior wall. Emergency cardiac catheterization revealed ostial LAD and ostial left circumflex occlusion. Flow was partially reestablished and bailout wires were placed in the left circumflex and left anterior descending coronary arteries. An intra-aortic balloon pump was placed and he was brought to the operating room for emergency coronary artery bypass grafting by Dr. Cyndia Bent. Intra-operative transesophageal echocardiography was indicated to evaluate the left and right ventricular function, to assess for any valvular pathology, and to serve as a monitor for intraoperative volume status and intracardiac air.  The patient was brought to the operating room at [facility] and general anesthesia was induced without difficulty. Following endotracheal intubation and orogastric suctioning, the transesophageal echocardiography probe was inserted into the esophagus without difficulty.  Impression: Pre-bypass Findings:  1. Aortic valve: The aortic valve was trileaflet. The leaflets opened normally without restriction. The leaflets were were not thickened. There was trace aortic insufficiency.   2. Mitral valve: The mitral leaflets appeared to be of normal thickness. Coaptation was normal without prolapsing or flail segments. There was trace mitral insufficiency.   3. Left ventricle: There was severe left ventricular dysfunction. The basal to mid anterior, lateral,  Inferior, and septal walls appeared to be moving adequately. However  from the mid-papillary level to the apex, the anterior septum, anterior wall, and apex were akinetic. There appeared to be adequate contractility of the more distal  lateral and inferior segments. The ejection fraction was estimated at 25-30%. Left ventricular end-diastolic diameter measured 5.52 cm which was at the upper limits of normal size. There was no thinning of the left ventricular walls. . Left ventricular wall thickness was normal and measured 0.85-9.0 0.95 cm at end diastole at the mid-papillary level.   4. Right ventricle: The right ventricular cavity was of normal size. There was normal contractility the right ventricular free wall and normal appearing right ventricular systolic function.   5. Tricuspid valve: The tricuspid valve appeared structurally normal. Tricuspid annular diameter was normal and measured 3.6 cm. There was a 1+ tricuspid insufficiency.  6. Interatrial septum: Interatrial septum was intact without evidence of patent foramen ovale or atrial septal defect by color Doppler and bubble study.  7. Left atrium: The left atrial cavity appeared to be within normal limits of size. There was no thrombus noted in the left atrial cavity or left atrial appendage.  8. Ascending aorta: There was a well-defined aortic root and sinotubular ridge. The  proximal aorta at the sinotubular ridge measured 3.28 cm and the proximal ascending aorta measured 3.3 cm at its maximal diameter approximately 4 cm distal to the aortic valve. The aortic walls appeared to be of normal caliber and there  was no protruding atheromatous disease noted. There was a wire noted to be present in the ascending aorta and  extending into the left coronary artery ostium.  8. Descending aorta there was an intra-aortic balloon pump noted. This appeared to extend  just distal to the left subclavian artery. The descending aorta was of normal diameter and measured 2.3 cm. There was no significant atheromatous disease that could be appreciated in the descending aorta.   9. Pericardium: No pericardial effusion could be appreciated.  Post-bypass findings:   1. Aortic valve: The  aortic  valve leaflets were of normal thickness and opened normally without restriction. There was trace aortic insufficiency.  2. Mitral valve: The mitral valve was unchanged from the pre-bypass study there was trace mitral insufficiency.  3. Left ventricle: There was persistent akinesis of the mid to distal anterior wall, anterior septum, and apex. The ejection fraction was estimated at 25-30% and was unchanged from the pre-bypass study. No intra-cardiac air was seen.  4. Right ventricle: There was reduced contractility of the right ventricular free wall  following separation from cardiopulmonary bypass. The right ventricular cavity was of normal size and there was no septal flattening. These findings were consistent with mild to moderate right ventricular dysfunction.  [name], M.D.

## 2015-11-29 NOTE — Progress Notes (Signed)
      [address]       [place] [address]             [phone]      More comfortable  BP 110/70 mmHg  Pulse 98  Temp(Src) 102 F (38.9 C) (Oral)  Resp 25  Ht '5\' 9"'$  (1.753 m)  Wt 175 lb 0.7 oz (79.4 kg)  BMI 25.84 kg/m2  SpO2 99%   Intake/Output Summary (Last 24 hours) at 11/29/15 1818 Last data filed at 11/29/15 1500  Gross per 24 hour  Intake 4714.37 ml  Output   3295 ml  Net 1419.37 ml   Creatinine 0.99, Hct= 29  Doing well  Remo Lipps C. Roxan Hockey, MD [facility] [phone]

## 2015-11-29 NOTE — Progress Notes (Signed)
All three CTs removed per order without incident.

## 2015-11-29 NOTE — Progress Notes (Signed)
1 Day Post-Op Procedure(s) (LRB): CORONARY ARTERY BYPASS GRAFTING (CABG) TIMES FIVE USING LEFT INTERNAL MAMMARY ARTERY AND RIGHT GREATER SAPHENOUS,VIEN HARVEATED BY ENDOVIEN, INTRAOPPRATIVE TEE (N/A) Subjective: C/o pain  Objective: Vital signs in last 24 hours: Temp:  [97.5 F (36.4 C)-99.7 F (37.6 C)] 99.7 F (37.6 C) (04/01 0900) Pulse Rate:  [0-193] 100 (04/01 0900) Cardiac Rhythm:  [-] Normal sinus rhythm;Atrial paced (04/01 0900) Resp:  [0-62] 16 (04/01 0900) BP: (68-146)/(50-109) 88/60 mmHg (04/01 0900) SpO2:  [0 %-100 %] 100 % (04/01 0900) Arterial Line BP: (75-153)/(48-100) 94/58 mmHg (04/01 0900) FiO2 (%):  [40 %-50 %] 40 % (04/01 0041) Weight:  [175 lb 0.7 oz (79.4 kg)] 175 lb 0.7 oz (79.4 kg) (03/31 1157)  Hemodynamic parameters for last 24 hours: PAP: (15-33)/(11-21) 27/14 mmHg CO:  [3.5 L/min-5.9 L/min] 5.9 L/min CI:  [1.8 L/min/m2-3 L/min/m2] 3 L/min/m2  Intake/Output from previous day: 03/31 0701 - 04/01 0700 In: 5617.3 [I.V.:3261.3; Blood:286; IV Piggyback:2070] Out: 4196 [Urine:4200; Blood:800; Chest Tube:445] Intake/Output this shift: Total I/O In: 135.2 [I.V.:135.2] Out: -   General appearance: alert and no distress Neurologic: intact Heart: regular rate and rhythm Lungs: diminished breath sounds bibasilar Extremities: well perfused  Lab Results:  Recent Labs  11/28/15 2301 11/29/15 0059 11/29/15 0402  WBC 14.1*  --  13.4*  HGB 10.5* 10.2* 9.6*  HCT 32.4* 30.0* 29.5*  PLT 137*  --  130*   BMET:  Recent Labs  11/28/15 1010  11/29/15 0059 11/29/15 0402  NA 138  < > 140 139  K 3.7  < > 4.3 4.2  CL 106  < > 104 110  CO2 21*  --   --  24  GLUCOSE 130*  < > 148* 108*  BUN 17  < > 9 8  CREATININE 0.98  < > 0.70 0.81  CALCIUM 9.3  --   --  7.8*  < > = values in this interval not displayed.  PT/INR:  Recent Labs  11/28/15 2301  LABPROT 16.2*  INR 1.29   ABG    Component Value Date/Time   PHART 7.415 11/29/2015 0241   HCO3 27.7*  11/29/2015 0241   TCO2 29 11/29/2015 0241   ACIDBASEDEF 1.0 11/29/2015 0056   O2SAT 98.0 11/29/2015 0241   CBG (last 3)   Recent Labs  11/29/15 0339 11/29/15 0449 11/29/15 0559  GLUCAP 110* 109* 101*    Assessment/Plan: S/P Procedure(s) (LRB): CORONARY ARTERY BYPASS GRAFTING (CABG) TIMES FIVE USING LEFT INTERNAL MAMMARY ARTERY AND RIGHT GREATER SAPHENOUS,VIEN HARVEATED BY ENDOVIEN, INTRAOPPRATIVE TEE (N/A) -  POD # 1 emergency CABG  CV- s/p CABG for STEMI  Good hemodynamics with IABP at 1:3- dc IABP and femoral sheath  Dc swan  Will continue dopamine for now as BP relatively low  RESP- IS   RENAL- creatinine OK  ENDO- CBG well controlled- transition to Q4 SSI  Anemia secondary to ABL- mild, follow  DC CT later today   LOS: 1 day    Melrose Nakayama 11/29/2015

## 2015-11-30 ENCOUNTER — Inpatient Hospital Stay (HOSPITAL_COMMUNITY): Payer: BC Managed Care – PPO

## 2015-11-30 LAB — CBC
HCT: 29.9 % — ABNORMAL LOW (ref 39.0–52.0)
Hemoglobin: 9.6 g/dL — ABNORMAL LOW (ref 13.0–17.0)
MCH: 29.5 pg (ref 26.0–34.0)
MCHC: 32.1 g/dL (ref 30.0–36.0)
MCV: 92 fL (ref 78.0–100.0)
Platelets: 124 10*3/uL — ABNORMAL LOW (ref 150–400)
RBC: 3.25 MIL/uL — ABNORMAL LOW (ref 4.22–5.81)
RDW: 13.4 % (ref 11.5–15.5)
WBC: 14.1 10*3/uL — ABNORMAL HIGH (ref 4.0–10.5)

## 2015-11-30 LAB — GLUCOSE, CAPILLARY
Glucose-Capillary: 104 mg/dL — ABNORMAL HIGH (ref 65–99)
Glucose-Capillary: 106 mg/dL — ABNORMAL HIGH (ref 65–99)
Glucose-Capillary: 93 mg/dL (ref 65–99)

## 2015-11-30 LAB — BASIC METABOLIC PANEL
Anion gap: 6 (ref 5–15)
BUN: 10 mg/dL (ref 6–20)
CO2: 24 mmol/L (ref 22–32)
Calcium: 8.2 mg/dL — ABNORMAL LOW (ref 8.9–10.3)
Chloride: 104 mmol/L (ref 101–111)
Creatinine, Ser: 0.97 mg/dL (ref 0.61–1.24)
GFR calc Af Amer: >60 mL/min (ref >60)
GFR calc non Af Amer: >60 mL/min (ref >60)
Glucose, Bld: 114 mg/dL — ABNORMAL HIGH (ref 65–99)
Potassium: 4 mmol/L (ref 3.5–5.1)
Sodium: 134 mmol/L — ABNORMAL LOW (ref 135–145)

## 2015-11-30 MED ORDER — GUAIFENESIN ER 600 MG PO TB12
600.0000 mg | ORAL_TABLET | Freq: Two times a day (BID) | ORAL | Status: DC
  Administered 2015-11-30 – 2015-12-04 (×9): 600 mg via ORAL
  Filled 2015-11-30 (×9): qty 1

## 2015-11-30 MED ORDER — METOPROLOL TARTRATE 25 MG/10 ML ORAL SUSPENSION: 25.0000 mg | Freq: Two times a day (BID) | ORAL | Status: DC

## 2015-11-30 MED ORDER — METOPROLOL TARTRATE 25 MG PO TABS
25.0000 mg | ORAL_TABLET | Freq: Two times a day (BID) | ORAL | Status: DC
  Administered 2015-11-30 – 2015-12-01 (×2): 25 mg via ORAL
  Filled 2015-11-30 (×2): qty 1

## 2015-11-30 MED ORDER — CEFTRIAXONE SODIUM 1 G IJ SOLR
1.0000 g | INTRAMUSCULAR | Status: DC
  Administered 2015-11-30 – 2015-12-03 (×4): 1 g via INTRAVENOUS
  Filled 2015-11-30 (×6): qty 10

## 2015-11-30 MED ORDER — PANTOPRAZOLE SODIUM 40 MG PO TBEC
40.0000 mg | DELAYED_RELEASE_TABLET | Freq: Two times a day (BID) | ORAL | Status: DC
  Administered 2015-11-30 – 2015-12-01 (×3): 40 mg via ORAL
  Filled 2015-11-30 (×3): qty 1

## 2015-11-30 MED ORDER — ENOXAPARIN SODIUM 40 MG/0.4ML ~~LOC~~ SOLN
40.0000 mg | SUBCUTANEOUS | Status: DC
  Administered 2015-11-30 – 2015-12-03 (×4): 40 mg via SUBCUTANEOUS
  Filled 2015-11-30 (×5): qty 0.4

## 2015-11-30 NOTE — Progress Notes (Signed)
2 Days Post-Op Procedure(s) (LRB): CORONARY ARTERY BYPASS GRAFTING (CABG) TIMES FIVE USING LEFT INTERNAL MAMMARY ARTERY AND RIGHT GREATER SAPHENOUS,VIEN HARVEATED BY ENDOVIEN, INTRAOPPRATIVE TEE (N/A) Subjective: C/o pain and reflux- morphine doesn't last long and oxycodone aggravates reflux  Objective: Vital signs in last 24 hours: Temp:  [98.6 F (37 C)-102.9 F (39.4 C)] 102.9 F (39.4 C) (04/02 0821) Pulse Rate:  [31-124] 110 (04/02 0830) Cardiac Rhythm:  [-] Sinus tachycardia (04/02 0800) Resp:  [0-28] 25 (04/02 0830) BP: (88-121)/(60-99) 109/72 mmHg (04/02 0830) SpO2:  [80 %-100 %] 95 % (04/02 0830) Arterial Line BP: (94-116)/(57-65) 116/60 mmHg (04/01 1145) Weight:  [192 lb 14.4 oz (87.5 kg)-193 lb 2 oz (87.6 kg)] 192 lb 14.4 oz (87.5 kg) (04/02 0600)  Hemodynamic parameters for last 24 hours: PAP: (23-36)/(7-19) 28/17 mmHg CO:  [5.6 L/min] 5.6 L/min CI:  [2.9 L/min/m2] 2.9 L/min/m2  Intake/Output from previous day: 04/01 0701 - 04/02 0700 In: 2756.5 [P.O.:1060; I.V.:1596.5; IV Piggyback:100] Out: 1245 [Urine:895; Chest Tube:350] Intake/Output this shift: Total I/O In: 42 [I.V.:42] Out: 40 [Urine:40]  General appearance: alert, cooperative and febrile Neurologic: intact Heart: tachy regular Lungs: diminished breath sounds bibasilar Abdomen: normal findings: soft, non-tender  Lab Results:  Recent Labs  11/29/15 1645 11/30/15 0357  WBC 15.7* 14.1*  HGB 9.8* 9.6*  HCT 29.6* 29.9*  PLT 133* 124*   BMET:  Recent Labs  11/29/15 0402 11/29/15 1643 11/29/15 1645 11/30/15 0357  NA 139 139  --  134*  K 4.2 4.2  --  4.0  CL 110 104  --  104  CO2 24  --   --  24  GLUCOSE 108* 110*  --  114*  BUN 8 12  --  10  CREATININE 0.81 0.90 0.99 0.97  CALCIUM 7.8*  --   --  8.2*    PT/INR:  Recent Labs  11/28/15 2301  LABPROT 16.2*  INR 1.29   ABG    Component Value Date/Time   PHART 7.415 11/29/2015 0241   HCO3 27.7* 11/29/2015 0241   TCO2 23 11/29/2015  1643   ACIDBASEDEF 1.0 11/29/2015 0056   O2SAT 98.0 11/29/2015 0241   CBG (last 3)   Recent Labs  11/29/15 1955 11/29/15 2335 11/30/15 0347  GLUCAP 129* 93 106*    Assessment/Plan: S/P Procedure(s) (LRB): CORONARY ARTERY BYPASS GRAFTING (CABG) TIMES FIVE USING LEFT INTERNAL MAMMARY ARTERY AND RIGHT GREATER SAPHENOUS,VIEN HARVEATED BY ENDOVIEN, INTRAOPPRATIVE TEE (N/A) -  POD # 2 CABG Febrile to 102- blood cultures, pulmonary hygiene, dc central line, dc foley  CV- mildly tachy, weaning neo- dc central line once off neo  RESP- bibasilar atelectasis- IS, flutter, mucinex  RENAL- creatinine and lytes OK  ENDO- CBG well controlled - change to [facility] hS  Enoxaparin + SCD for DVT prophylaxis    LOS: 2 days    Melrose Nakayama 11/30/2015

## 2015-11-30 NOTE — Anesthesia Postprocedure Evaluation (Signed)
Anesthesia Post Note  Patient: [name]  Procedure(s) Performed: Procedure(s) (LRB): CORONARY ARTERY BYPASS GRAFTING (CABG) TIMES FIVE USING LEFT INTERNAL MAMMARY ARTERY AND RIGHT GREATER SAPHENOUS,VIEN HARVEATED BY ENDOVIEN, INTRAOPPRATIVE TEE (N/A)  Patient location during evaluation: SICU Anesthesia Type: General Level of consciousness: patient remains intubated per anesthesia plan Pain management: pain level controlled Vital Signs Assessment: post-procedure vital signs reviewed and stable Respiratory status: patient remains intubated per anesthesia plan Cardiovascular status: stable Anesthetic complications: no    Last Vitals:  Filed Vitals:   11/30/15 0827 11/30/15 0830  BP: 114/73 109/72  Pulse: 109 110  Temp:    Resp: 19 25    Last Pain:  Filed Vitals:   11/30/15 0847  PainSc: 0-No pain                 EDWARDS,Lalania Haseman

## 2015-11-30 NOTE — Progress Notes (Signed)
      [address]       [place] [address]             [phone]      Feels better this afternoon  BP 113/67 mmHg  Pulse 108  Temp(Src) 100.6 F (38.1 C) (Oral)  Resp 23  Ht '5\' 9"'$  (1.753 m)  Wt 192 lb 14.4 oz (87.5 kg)  BMI 28.47 kg/m2  SpO2 96%   Intake/Output Summary (Last 24 hours) at 11/30/15 1801 Last data filed at 11/30/15 1709  Gross per 24 hour  Intake 2679.41 ml  Output   1360 ml  Net 1319.41 ml   Fevers trending down but still present. Will start empiric ceftriaxone for presumed bronchitis  Remo Lipps C. Roxan Hockey, MD [facility] [phone]

## 2015-12-01 ENCOUNTER — Inpatient Hospital Stay (HOSPITAL_COMMUNITY): Payer: BC Managed Care – PPO

## 2015-12-01 ENCOUNTER — Encounter (HOSPITAL_COMMUNITY): Payer: Self-pay | Admitting: Surgery

## 2015-12-01 DIAGNOSIS — Z951 Presence of aortocoronary bypass graft: Secondary | ICD-10-CM

## 2015-12-01 DIAGNOSIS — I255 Ischemic cardiomyopathy: Secondary | ICD-10-CM

## 2015-12-01 LAB — BASIC METABOLIC PANEL
Anion gap: 6 (ref 5–15)
BUN: 14 mg/dL (ref 6–20)
CO2: 26 mmol/L (ref 22–32)
Calcium: 8.4 mg/dL — ABNORMAL LOW (ref 8.9–10.3)
Chloride: 104 mmol/L (ref 101–111)
Creatinine, Ser: 1 mg/dL (ref 0.61–1.24)
GFR calc Af Amer: >60 mL/min (ref >60)
GFR calc non Af Amer: >60 mL/min (ref >60)
Glucose, Bld: 114 mg/dL — ABNORMAL HIGH (ref 65–99)
Potassium: 4.4 mmol/L (ref 3.5–5.1)
Sodium: 136 mmol/L (ref 135–145)

## 2015-12-01 LAB — CBC
HCT: 25.7 % — ABNORMAL LOW (ref 39.0–52.0)
Hemoglobin: 8.5 g/dL — ABNORMAL LOW (ref 13.0–17.0)
MCH: 30.5 pg (ref 26.0–34.0)
MCHC: 33.1 g/dL (ref 30.0–36.0)
MCV: 92.1 fL (ref 78.0–100.0)
Platelets: 105 10*3/uL — ABNORMAL LOW (ref 150–400)
RBC: 2.79 MIL/uL — ABNORMAL LOW (ref 4.22–5.81)
RDW: 13.3 % (ref 11.5–15.5)
WBC: 9.9 10*3/uL (ref 4.0–10.5)

## 2015-12-01 LAB — URINE CULTURE: Culture: NO GROWTH
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
## 2015-12-01 MED ORDER — ONDANSETRON HCL 4 MG PO TABS: 4.0000 mg | ORAL_TABLET | Freq: Four times a day (QID) | ORAL | Status: DC | PRN

## 2015-12-01 MED ORDER — METOPROLOL TARTRATE 25 MG PO TABS
25.0000 mg | ORAL_TABLET | Freq: Two times a day (BID) | ORAL | Status: DC
  Administered 2015-12-01 (×2): 25 mg via ORAL
  Filled 2015-12-01 (×2): qty 1

## 2015-12-01 MED ORDER — DOCUSATE SODIUM 100 MG PO CAPS
200.0000 mg | ORAL_CAPSULE | Freq: Every day | ORAL | Status: DC
  Administered 2015-12-01: 200 mg via ORAL
  Filled 2015-12-01: qty 2

## 2015-12-01 MED ORDER — SODIUM CHLORIDE 0.9% FLUSH: 3.0000 mL | INTRAVENOUS | Status: DC | PRN

## 2015-12-01 MED ORDER — FUROSEMIDE 40 MG PO TABS
40.0000 mg | ORAL_TABLET | Freq: Every day | ORAL | Status: AC
  Administered 2015-12-01 – 2015-12-03 (×3): 40 mg via ORAL
  Filled 2015-12-01 (×3): qty 1

## 2015-12-01 MED ORDER — ACETAMINOPHEN 325 MG PO TABS
650.0000 mg | ORAL_TABLET | Freq: Four times a day (QID) | ORAL | Status: DC | PRN
  Administered 2015-12-01 – 2015-12-04 (×2): 650 mg via ORAL
  Filled 2015-12-01 (×2): qty 2

## 2015-12-01 MED ORDER — SODIUM CHLORIDE 0.9 % IV SOLN: 250.0000 mL | INTRAVENOUS | Status: DC | PRN

## 2015-12-01 MED ORDER — ASPIRIN EC 325 MG PO TBEC
325.0000 mg | DELAYED_RELEASE_TABLET | Freq: Every day | ORAL | Status: DC
  Administered 2015-12-01 – 2015-12-04 (×4): 325 mg via ORAL
  Filled 2015-12-01 (×4): qty 1

## 2015-12-01 MED ORDER — BISACODYL 10 MG RE SUPP: 10.0000 mg | Freq: Every day | RECTAL | Status: DC | PRN

## 2015-12-01 MED ORDER — SIMVASTATIN 20 MG PO TABS
20.0000 mg | ORAL_TABLET | Freq: Every day | ORAL | Status: DC
  Administered 2015-12-01 – 2015-12-03 (×3): 20 mg via ORAL
  Filled 2015-12-01 (×3): qty 1

## 2015-12-01 MED ORDER — TRAMADOL HCL 50 MG PO TABS
50.0000 mg | ORAL_TABLET | ORAL | Status: DC | PRN
  Administered 2015-12-01: 50 mg via ORAL
  Administered 2015-12-02 – 2015-12-04 (×8): 100 mg via ORAL
  Filled 2015-12-01 (×4): qty 2
  Filled 2015-12-01 (×2): qty 1
  Filled 2015-12-01: qty 2
  Filled 2015-12-01: qty 1
  Filled 2015-12-01: qty 2
  Filled 2015-12-01: qty 1
  Filled 2015-12-01: qty 2

## 2015-12-01 MED ORDER — PANTOPRAZOLE SODIUM 40 MG PO TBEC
40.0000 mg | DELAYED_RELEASE_TABLET | Freq: Every day | ORAL | Status: DC
  Administered 2015-12-02 – 2015-12-04 (×3): 40 mg via ORAL
  Filled 2015-12-01 (×2): qty 1

## 2015-12-01 MED ORDER — ONDANSETRON HCL 4 MG/2ML IJ SOLN
4.0000 mg | Freq: Four times a day (QID) | INTRAMUSCULAR | Status: DC | PRN
  Administered 2015-12-01 – 2015-12-02 (×2): 4 mg via INTRAVENOUS
  Filled 2015-12-01 (×2): qty 2

## 2015-12-01 MED ORDER — MOVING RIGHT ALONG BOOK
Freq: Once | Status: AC
  Administered 2015-12-01: 10:00:00
  Filled 2015-12-01: qty 1

## 2015-12-01 MED ORDER — KETOROLAC TROMETHAMINE 15 MG/ML IJ SOLN
15.0000 mg | Freq: Four times a day (QID) | INTRAMUSCULAR | Status: DC | PRN
  Administered 2015-12-01 – 2015-12-04 (×7): 15 mg via INTRAVENOUS
  Filled 2015-12-01 (×7): qty 1

## 2015-12-01 MED ORDER — BISACODYL 5 MG PO TBEC: 10.0000 mg | DELAYED_RELEASE_TABLET | Freq: Every day | ORAL | Status: DC | PRN

## 2015-12-01 MED ORDER — POTASSIUM CHLORIDE CRYS ER 20 MEQ PO TBCR
20.0000 meq | EXTENDED_RELEASE_TABLET | Freq: Every day | ORAL | Status: AC
  Administered 2015-12-01 – 2015-12-03 (×3): 20 meq via ORAL
  Filled 2015-12-01 (×3): qty 1

## 2015-12-01 MED ORDER — SODIUM CHLORIDE 0.9% FLUSH
3.0000 mL | Freq: Two times a day (BID) | INTRAVENOUS | Status: DC
  Administered 2015-12-01 – 2015-12-03 (×6): 3 mL via INTRAVENOUS
  Administered 2015-12-04: 5 mL via INTRAVENOUS

## 2015-12-01 MED FILL — Lidocaine HCl IV Inj 20 MG/ML: INTRAVENOUS | Qty: 5 | Status: AC

## 2015-12-01 MED FILL — Electrolyte-R (PH 7.4) Solution: INTRAVENOUS | Qty: 5000 | Status: AC

## 2015-12-01 MED FILL — Heparin Sodium (Porcine) Inj 1000 Unit/ML: INTRAMUSCULAR | Qty: 20 | Status: AC

## 2015-12-01 MED FILL — Verapamil HCl IV Soln 2.5 MG/ML: INTRAVENOUS | Qty: 2 | Status: AC

## 2015-12-01 MED FILL — Mannitol IV Soln 20%: INTRAVENOUS | Qty: 500 | Status: AC

## 2015-12-01 MED FILL — Sodium Chloride IV Soln 0.9%: INTRAVENOUS | Qty: 2000 | Status: AC

## 2015-12-01 MED FILL — Sodium Bicarbonate IV Soln 8.4%: INTRAVENOUS | Qty: 50 | Status: AC

## 2015-12-01 NOTE — Progress Notes (Signed)
Transfer received from 2S at 0920 and pt arrived to the unit at 1000 via wheelchair with belongings to the side. Pt A&O x4; pt oriented to the unit and room; pacing wires protected and taped unto pt abd; chest incision approximated; open to air, clean and dry. RLE incision also remains clean, dry and intact open to air with dermabond; pt voided into urinal upon arriving to the unit. Pt oriented to the unit and room; pt educated on fall/safety precautions and prevention; bed alarm turned on and pt in bed comfortably with call light within reach. Will continue to monitor. Delia Heady RN

## 2015-12-01 NOTE — Op Note (Addendum)
CARDIOVASCULAR SURGERY OPERATIVE NOTE  11/27/2015  Surgeon:  Gaye Pollack, MD  First Assistant: Ellwood Handler,  PA-C   Preoperative Diagnosis:  Severe multi-vessel coronary artery disease s/p acute anterior STEMI   Postoperative Diagnosis:  Same   Procedure:  1. Emergent Median Sternotomy 2. Extracorporeal circulation 3.   Coronary artery bypass grafting x 5   Left internal mammary graft to the LAD  SVG to diagonal  Sequential SVG to Ramus and OM  SVG to PDA 4.   Endoscopic vein harvest from the right leg   Anesthesia:  General Endotracheal   Clinical History/Surgical Indication:  The patient is a 60 year old gentleman with treated Hep C, smoker but no prior cardiac history who developed acute severe SSCP this am at rest while working at his desk. The pain radiated to left arm and associated with shortness of breath. Brought in by EMS with ECG showing anterior ST elevation. Taken to cath lab emergently in stable condition. Cath shows ostial/proximal LAD/diagonal occlusion with a tight ostial/proximal LCX stenosis and significant RCA disease. No LV gram done. The LAD/D were opened with PTCA to reestablish flow but this area is severely diseased and wires left in on Angiomax. IABP put in empirically for anatomy. His ST elevations resolved with opening the vessel and BP has been stable.  He has severe multi-vessel CAD s/p acute anterior STEMI due to LAD/diagnonal occlusion. He has complex disease involving the ostium of the LAD/D, ramus and LCX as well as significant RCA disease. This is best treated with CABG. Will keep the wires down the coronaries on Angiomax and plan CABG as soon as an operating room opens up. I discussed the operative procedure with the patient including alternatives, benefits and risks; including but not limited to bleeding, blood transfusion, infection, stroke,  myocardial infarction, graft failure, heart block requiring a permanent pacemaker, organ dysfunction, and death. [name] understands and agrees to proceed.   Preparation:  The patient was seen in the preoperative holding area and the correct patient, correct operation were confirmed with the patient after reviewing the medical record and catheterization. The consent was signed by me. Preoperative antibiotics were given. A pulmonary arterial line and radial arterial line were placed by the anesthesia team. The patient was taken back to the operating room and positioned supine on the operating room table. After being placed under general endotracheal anesthesia by the anesthesia team a foley catheter was placed. The neck, chest, abdomen, and both legs were prepped with betadine soap and solution and draped in the usual sterile manner. A surgical time-out was taken and the correct patient and operative procedure were confirmed with the nursing and anesthesia staff.   Cardiopulmonary Bypass:  A median sternotomy was performed. The pericardium was opened in the midline. Right ventricular function appeared normal. The ascending aorta was of normal size and had no palpable plaque. There were no contraindications to aortic cannulation or cross-clamping. The patient was fully systemically heparinized and the ACT was maintained > 400 sec. The proximal aortic arch was cannulated with a 20 F aortic cannula for arterial inflow. Venous cannulation was performed via the right atrial appendage using a two-staged venous cannula. An antegrade cardioplegia/vent cannula was inserted into the mid-ascending aorta. Aortic occlusion was performed with a single cross-clamp. Systemic cooling to 32 degrees Centigrade and topical cooling of the heart with iced saline were used. Hyperkalemic antegrade cold blood cardioplegia was used to induce diastolic arrest and was then given at about 20 minute  intervals throughout the  period of arrest to maintain myocardial temperature at or below 10 degrees centigrade. A temperature probe was inserted into the interventricular septum and an insulating pad was placed in the pericardium.   Left internal mammary harvest:  The left side of the sternum was retracted using the Rultract retractor. The left internal mammary artery was harvested as a pedicle graft. All side branches were clipped. It was a medium-sized vessel of good quality with excellent blood flow. It was ligated distally and divided. It was sprayed with topical papaverine solution to prevent vasospasm.   Endoscopic vein harvest:  The right greater saphenous vein was harvested endoscopically through a 2 cm incision medial to the right knee. It was harvested from the upper thigh to below the knee. It was a medium-sized vein of good quality. The side branches were all ligated with 4-0 silk ties.    Coronary arteries:  The coronary arteries were examined.   LAD:  Large vessel with calcified proximal disease and patchy diffuse disease. Diagonal 1 is large with patchy proximal disease but no distal disease  LCX:  Ramus is small but graftable with no distal disease. The OM is large with no distal disease  RCA:  Diffusely diseased extending out into the proximal PDA. Distal PDA suitable for grafting with minimal disease.   Grafts:  1. LIMA to the LAD: 2.5 mm. It was sewn end to side using 8-0 prolene continuous suture. 2. SVG to D1:  1.75 mm. It was sewn end to side using 7-0 prolene continuous suture. 3. Sequential SVG to Ramus:  1.6 mm. It was sewn sequential side to side using 7-0 prolene continuous suture. 4. Sequential SVG to OM:  2.0 mm. It was sewn sequential end to side using 7-0 prolene continuous suture. 5. Sequential SVG to PDA: 1.75 mm.  It was sewn end to side using 7-0 prolene continuous suture.  The proximal vein graft anastomoses were performed to the mid-ascending aorta using continuous 6-0  prolene suture. Graft markers were placed around the proximal anastomoses.   Completion:  The patient was rewarmed to 37 degrees Centigrade. The clamp was removed from the LIMA pedicle and there was rapid warming of the septum and return of ventricular fibrillation. The crossclamp was removed with a time of 84 minutes. There was spontaneous return of sinus rhythm. The distal and proximal anastomoses were checked for hemostasis. The position of the grafts was satisfactory. Two temporary epicardial pacing wires were placed on the right atrium and two on the right ventricle. The patient was weaned from CPB without difficulty on dopamine 3 mcg and the IABP was started at 1:1. CPB time was 110 minutes. Cardiac output was 6 LPM. Heparin was fully reversed with protamine and the aortic and venous cannulas removed. Hemostasis was achieved. Mediastinal and left pleural drainage tubes were placed. The sternum was closed with  #6 stainless steel wires. The fascia was closed with continuous # 1 vicryl suture. The subcutaneous tissue was closed with 2-0 vicryl continuous suture. The skin was closed with 3-0 vicryl subcuticular suture. All sponge, needle, and instrument counts were reported correct at the end of the case. Dry sterile dressings were placed over the incisions and around the chest tubes which were connected to pleurevac suction. The patient was then transported to the surgical intensive care unit in critical but stable condition.   *[facility]*  [address] [place], [place] [address]  [phone]  ------------------------------------------------------------------- Intraoperative  Transesophageal Echocardiography  Patient: [name], Pienta MR #: [id number] Study Date: 11/28/2015 Gender: M Age: 28 Height: Weight: BSA: Pt.  Status: Room: 2S07C  ATTENDING Gilford Raid, MD ORDERING Gilford Raid, MD PERFORMING Roberts Gaudy, M.D. ADMITTING Larae Grooms S SONOGRAPHER Donata Clay  cc:  -------------------------------------------------------------------  ------------------------------------------------------------------- Indications: CABG by-pass RWMA  ------------------------------------------------------------------- Impressions:  - Intra-operative Transesophageal Echocardiography:   Maclane Holloran is a 60 year old male with history of hepatitis C,  eosinophilic esophagitis, and smoking who presented to the [facility] Emergency room on 11/28/2015 with the acute onset  of chest pain and he was found to have an acute STEMI involving  the anterior wall. Emergency cardiac catheterization revealed  ostial LAD and ostial left circumflex occlusion. Flow was  partially reestablished and bailout wires were placed in the left  circumflex and left anterior descending coronary arteries. An  intra-aortic balloon pump was placed and he was brought to the  operating room for emergency coronary artery bypass grafting by  Dr. Cyndia Bent. Intra-operative transesophageal echocardiography was  indicated to evaluate the left and right ventricular function, to  assess for any valvular pathology, and to serve as a monitor for  intraoperative volume status and intracardiac air.   The patient was brought to the operating room at [facility] and general anesthesia was induced without difficulty.  Following endotracheal intubation and orogastric suctioning, the  transesophageal echocardiography probe was inserted into the  esophagus without difficulty.   Impression: Pre-bypass Findings:   1. Aortic valve: The aortic valve was trileaflet. The leaflets  opened normally without restriction. The leaflets were were not  thickened. There was  trace aortic insufficiency.   2. Mitral valve: The mitral leaflets appeared to be of normal  thickness. Coaptation was normal without prolapsing or flail  segments. There was trace mitral insufficiency.   3. Left ventricle: There was severe left ventricular dysfunction.  The basal to mid anterior, lateral, Inferior, and septal walls  appeared to be moving adequately. However from the mid-papillary  level to the apex, the anterior septum, anterior wall, and apex  were akinetic. There appeared to be adequate contractility of the  more distal lateral and inferior segments. The ejection fraction  was estimated at 25-30%. Left ventricular end-diastolic diameter  measured 5.52 cm which was at the upper limits of normal size.  There was no thinning of the left ventricular walls. . Left  ventricular wall thickness was normal and measured 0.85-9.0 0.95  cm at end diastole at the mid-papillary level.   4. Right ventricle: The right ventricular cavity was of normal  size. There was normal contractility the right ventricular free  wall and normal appearing right ventricular systolic function.   5. Tricuspid valve: The tricuspid valve appeared structurally  normal. Tricuspid annular diameter was normal and measured 3.6  cm. There was a 1+ tricuspid insufficiency.   6. Interatrial septum: Interatrial septum was intact without  evidence of patent foramen ovale or atrial septal defect by color  Doppler and bubble study.   7. Left atrium: The left atrial cavity appeared to be within  normal limits of size. There was no thrombus noted in the left  atrial cavity or left atrial appendage.   8. Ascending aorta: There was a well-defined aortic root and  sinotubular ridge. The proximal aorta at the sinotubular ridge  measured 3.28 cm and the proximal ascending aorta measured 3.3 cm  at its maximal diameter approximately 4 cm distal to the aortic  valve. The  aortic walls appeared to be of normal caliber and  there was no protruding atheromatous disease noted. There was a  wire noted to be present in the ascending aorta and extending  into the left coronary artery ostium.   8. Descending aorta there was an intra-aortic balloon pump noted.  This appeared to extend just distal to the left subclavian  artery. The descending aorta was of normal diameter and measured  2.3 cm. There was no significant atheromatous disease that could  be appreciated in the descending aorta.   9. Pericardium: No pericardial effusion could be appreciated.   Post-bypass findings:   1. Aortic valve: The aortic valve leaflets were of normal  thickness and opened normally without restriction. There was  trace aortic insufficiency.   2. Mitral valve: The mitral valve was unchanged from the  pre-bypass study there was trace mitral insufficiency.   3. Left ventricle: There was persistent akinesis of the mid to  distal anterior wall, anterior septum, and apex. The ejection  fraction was estimated at 25-30% and was unchanged from the  pre-bypass study. No intra-cardiac air was seen.   4. Right ventricle: There was reduced contractility of the right  ventricular free wall following separation from cardiopulmonary  bypass. The right ventricular cavity was of normal size and there  was no septal flattening. These findings were consistent with  mild to moderate right ventricular dysfunction.   Roberts Gaudy, M.D.  Intraoperative transesophageal echocardiography. Birthdate: Patient birthdate: 1956-04-01. Age: Patient is 60 yr old. Sex: Gender: male. Study date: Study date: 11/28/2015. Study time: 01:20 PM.  -------------------------------------------------------------------  ------------------------------------------------------------------- Pre bypass:  Post bypass: Pre bypass: Post  bypass:  ------------------------------------------------------------------- Prepared and Electronically Authenticated by  Roberts Gaudy, M.D. 2017-04-01T10:16:56

## 2015-12-01 NOTE — Progress Notes (Signed)
[id number] Offered to walk. Pt nauseated and not feeling well. Encouraged to walk with staff later. Gave gingerale and found recliner and cleaned it and put in room. Graylon Good RN BSN 12/01/2015 2:39 PM

## 2015-12-01 NOTE — Progress Notes (Signed)
3 Days Post-Op Procedure(s) (LRB): CORONARY ARTERY BYPASS GRAFTING (CABG) TIMES FIVE USING LEFT INTERNAL MAMMARY ARTERY AND RIGHT GREATER SAPHENOUS,VIEN HARVEATED BY ENDOVIEN, INTRAOPPRATIVE TEE (N/A) Subjective:  Sore but otherwise ok  Objective: Vital signs in last 24 hours: Temp:  [98.6 F (37 C)-102.9 F (39.4 C)] 98.6 F (37 C) (04/03 0325) Pulse Rate:  [87-118] 107 (04/03 0700) Cardiac Rhythm:  [-] Sinus tachycardia (04/03 0000) Resp:  [15-28] 17 (04/03 0700) BP: (81-135)/(47-108) 126/108 mmHg (04/03 0700) SpO2:  [92 %-100 %] 95 % (04/03 0700) Weight:  [86.183 kg (190 lb)] 86.183 kg (190 lb) (04/03 0652)  Hemodynamic parameters for last 24 hours:    Intake/Output from previous day: 04/02 0701 - 04/03 0700 In: 1012 [P.O.:900; I.V.:62; IV Piggyback:50] Out: 1640 [FEOFH:2197] Intake/Output this shift:    General appearance: alert and cooperative Heart: regular rate and rhythm, S1, S2 normal, no murmur, click, rub or gallop Lungs: clear to auscultation bilaterally Extremities: edema mild Wound: dressing dry  Lab Results:  Recent Labs  11/30/15 0357 12/01/15 0224  WBC 14.1* 9.9  HGB 9.6* 8.5*  HCT 29.9* 25.7*  PLT 124* 105*   BMET:  Recent Labs  11/30/15 0357 12/01/15 0224  NA 134* 136  K 4.0 4.4  CL 104 104  CO2 24 26  GLUCOSE 114* 114*  BUN 10 14  CREATININE 0.97 1.00  CALCIUM 8.2* 8.4*    PT/INR:  Recent Labs  11/28/15 2301  LABPROT 16.2*  INR 1.29   ABG    Component Value Date/Time   PHART 7.415 11/29/2015 0241   HCO3 27.7* 11/29/2015 0241   TCO2 23 11/29/2015 1643   ACIDBASEDEF 1.0 11/29/2015 0056   O2SAT 98.0 11/29/2015 0241   CBG (last 3)   Recent Labs  11/29/15 2335 11/30/15 0347 11/30/15 0815  GLUCAP 93 106* 104*    Assessment/Plan: S/P Procedure(s) (LRB): CORONARY ARTERY BYPASS GRAFTING (CABG) TIMES FIVE USING LEFT INTERNAL MAMMARY ARTERY AND RIGHT GREATER SAPHENOUS,VIEN HARVEATED BY ENDOVIEN, INTRAOPPRATIVE TEE (N/A)   He is hemodynamically stable in sinus rhythm Mobilize Diuresis Plan for transfer to step-down: see transfer orders   LOS: 3 days    Gaye Pollack 12/01/2015

## 2015-12-01 NOTE — Progress Notes (Signed)
SUBJECTIVE:  Feeling better a few days after CABG and anterior MI.  OBJECTIVE:   Vitals:   Filed Vitals:   12/01/15 0800 12/01/15 0829 12/01/15 0900 12/01/15 1000  BP: 114/73  110/73 109/59  Pulse: 99  99 108  Temp:  98.6 F (37 C)  98.9 F (37.2 C)  TempSrc:  Oral  Oral  Resp: '26  28 20  '$ Height:      Weight:      SpO2: 95%  94% 98%   I&O's:   Intake/Output Summary (Last 24 hours) at 12/01/15 1226 Last data filed at 12/01/15 0600  Gross per 24 hour  Intake    470 ml  Output   1000 ml  Net   -530 ml   TELEMETRY: Reviewed telemetry pt in :     PHYSICAL EXAM General: Well developed, well nourished, in no acute distress Head:   Normal cephalic and atramatic  Lungs:   Clear bilaterally to auscultation. Heart:   HRRR S1 S2  No JVD.   Abdomen: abdomen soft and non-tender Msk:  Back normal,  Normal strength and tone for age. Extremities:   No edema. No groin hematomas bilaterally.  2+ PT pulses bilaterally  Neuro: Alert and oriented. Psych:  Normal affect, responds appropriately Skin: No rash   LABS: Basic Metabolic Panel:  Recent Labs  11/29/15 0402  11/29/15 1645 11/30/15 0357 12/01/15 0224  NA 139  < >  --  134* 136  K 4.2  < >  --  4.0 4.4  CL 110  < >  --  104 104  CO2 24  --   --  24 26  GLUCOSE 108*  < >  --  114* 114*  BUN 8  < >  --  10 14  CREATININE 0.81  < > 0.99 0.97 1.00  CALCIUM 7.8*  --   --  8.2* 8.4*  MG 3.2*  --  2.5*  --   --   < > = values in this interval not displayed. Liver Function Tests: No results for input(s): AST, ALT, ALKPHOS, BILITOT, PROT, ALBUMIN in the last 72 hours. No results for input(s): LIPASE, AMYLASE in the last 72 hours. CBC:  Recent Labs  11/30/15 0357 12/01/15 0224  WBC 14.1* 9.9  HGB 9.6* 8.5*  HCT 29.9* 25.7*  MCV 92.0 92.1  PLT 124* 105*   Cardiac Enzymes:  Recent Labs  11/28/15 2301  TROPONINI 58.83*   BNP: Invalid input(s): POCBNP D-Dimer: No results for input(s): DDIMER in the last 72  hours. Hemoglobin A1C: No results for input(s): HGBA1C in the last 72 hours. Fasting Lipid Panel: No results for input(s): CHOL, HDL, LDLCALC, TRIG, CHOLHDL, LDLDIRECT in the last 72 hours. Thyroid Function Tests: No results for input(s): TSH, T4TOTAL, T3FREE, THYROIDAB in the last 72 hours.  Invalid input(s): FREET3 Anemia Panel: No results for input(s): VITAMINB12, FOLATE, FERRITIN, TIBC, IRON, RETICCTPCT in the last 72 hours. Coag Panel:   Lab Results  Component Value Date   INR 1.29 11/28/2015   INR 0.98 11/28/2015   INR 1.00 07/05/2014    RADIOLOGY: Dg Chest 2 View  11/14/2015  CLINICAL DATA:  Cough for 3 days EXAM: CHEST  2 VIEW COMPARISON:  03/07/2015 FINDINGS: Normal heart size. Lungs clear. No pneumothorax. No pleural effusion. IMPRESSION: No active cardiopulmonary disease. Electronically Signed   By: Marybelle Killings M.D.   On: 11/14/2015 11:18   Dg Chest Port 1 View  12/01/2015  CLINICAL DATA:  CABG. EXAM: PORTABLE CHEST 1 VIEW COMPARISON:  11/30/2015. FINDINGS: Interim removal of right IJ line. Prior CABG. Stable mild cardiomegaly. No pulmonary venous congestion. Bibasilar subsegmental atelectasis. No pleural effusion or pneumothorax. IMPRESSION: 1.  Interim removal of right IJ line and right IJ sheath. 2. Mild bibasilar subsegmental atelectasis. 3. Prior CABG.  Mild cardiomegaly.  No pulmonary venous congestion. Electronically Signed   By: Marcello Moores  Register   On: 12/01/2015 07:40   Dg Chest Port 1 View  11/30/2015  CLINICAL DATA:  Status post CABG. EXAM: PORTABLE CHEST 1 VIEW COMPARISON:  11/29/2015 FINDINGS: Cardiomegaly and median sternotomy again noted. Interval removal of intra-aortic balloon pump, mediastinal and left thoracostomy tubes and Swan-Ganz catheter noted. There is no evidence of pneumothorax. Mild bibasilar atelectasis identified without edema. A right IJ central venous catheter sheath and right IJ central venous catheter with tip overlying the lower SVC noted.  IMPRESSION: Support apparatus removal as described without other significant change. Mild bibasilar atelectasis without evidence of pneumothorax. Electronically Signed   By: Margarette Canada M.D.   On: 11/30/2015 07:37   Dg Chest Port 1 View  11/29/2015  CLINICAL DATA:  60 year old male status post CABG. EXAM: PORTABLE CHEST 1 VIEW COMPARISON:  11/28/2015 and prior exams FINDINGS: Cardiomegaly and CABG changes again identified. An NG tube and endotracheal tube have been removed. A right IJ central venous catheter with tip overlying the lower SVC, right IJ Swan-Ganz catheter with tip overlying the right pulmonary artery, intra aortic balloon pump with tip at the aortic arch, and mediastinal/left thoracostomy tubes again noted. There is no evidence of pneumothorax. Mild left basilar atelectasis again noted. IMPRESSION: NG and endotracheal tube removal. No other significant changes. No evidence of pneumothorax. Electronically Signed   By: Margarette Canada M.D.   On: 11/29/2015 09:52   Dg Chest Port 1 View  11/28/2015  CLINICAL DATA:  Post CABG x 5 EXAM: PORTABLE CHEST 1 VIEW COMPARISON:  Portable exam 1940 hours compared to 11/14/2015 FINDINGS: Tip of endotracheal tube projects 6.7 cm above carina. Nasogastric tube extends into stomach. Mediastinal drains and LEFT thoracostomy tube present. RIGHT jugular central venous catheter with tip projecting over SVC. RIGHT jugular Swan-Ganz catheter with tip projecting over RIGHT pulmonary artery near hilum. Tip of intra-aortic balloon pump projects over inferior aspect of aortic arch. Epicardial pacing wires present. Normal heart size post CABG. Mediastinal contours and pulmonary vascularity normal. Lungs clear. No pleural effusion or pneumothorax. IMPRESSION: Postsurgical changes as above. Electronically Signed   By: Lavonia Dana M.D.   On: 11/28/2015 19:55      ASSESSMENT: CAD, anterior wall MI  PLAN:  S/p emergent PCI and then CABG.    Ischemic cardiomyopathy based on  echo.  Not overtly in heart failure at this time.  Watch fluids to avoid CHF exacerbation.    Simvastatin for lipid lowering therapy.   No groin issues after IABP.    [name], MD  12/01/2015  12:26 PM

## 2015-12-01 NOTE — Plan of Care (Signed)
Problem: Pain Management: Goal: Pain level will decrease Outcome: Progressing Patient received IV morphine 4 mg once with GI cocktail for heartburn and pain 7/10. Ambien also given for sleep. Pain came down to 2/10 and the patient slept well. Patient needed 2 liters of oxygen when sleeping only.

## 2015-12-01 NOTE — Progress Notes (Signed)
Pt transferred to 2W13 with belongings. Report given to receiving RN and all questions answered. VSS during transfer.  Pt assisted to bed in new room. Receiving RN at bedside.  Family updated on patient's location via pt's cell phone.

## 2015-12-02 ENCOUNTER — Encounter (HOSPITAL_COMMUNITY)
Admission: EM | Disposition: A | Discharge status: Home / Self Care | Payer: Self-pay | Source: Home / Self Care | Attending: Surgery

## 2015-12-02 LAB — BASIC METABOLIC PANEL
Anion gap: 8 (ref 5–15)
BUN: 24 mg/dL — ABNORMAL HIGH (ref 6–20)
CO2: 27 mmol/L (ref 22–32)
Calcium: 8.6 mg/dL — ABNORMAL LOW (ref 8.9–10.3)
Chloride: 104 mmol/L (ref 101–111)
Creatinine, Ser: 1.09 mg/dL (ref 0.61–1.24)
GFR calc Af Amer: >60 mL/min (ref >60)
GFR calc non Af Amer: >60 mL/min (ref >60)
Glucose, Bld: 120 mg/dL — ABNORMAL HIGH (ref 65–99)
Potassium: 4.2 mmol/L (ref 3.5–5.1)
Sodium: 139 mmol/L (ref 135–145)

## 2015-12-02 LAB — C DIFFICILE QUICK SCREEN W PCR REFLEX
C Diff antigen: NEGATIVE
C Diff interpretation: NEGATIVE
C Diff toxin: NEGATIVE

## 2015-12-02 SURGERY — CORONARY ARTERY BYPASS GRAFTING (CABG)
Anesthesia: General | Site: Chest

## 2015-12-02 MED ORDER — LISINOPRIL 5 MG PO TABS
5.0000 mg | ORAL_TABLET | Freq: Every day | ORAL | Status: DC
  Administered 2015-12-02 – 2015-12-04 (×3): 5 mg via ORAL
  Filled 2015-12-02 (×3): qty 1

## 2015-12-02 MED ORDER — METOPROLOL TARTRATE 12.5 MG HALF TABLET
37.5000 mg | ORAL_TABLET | Freq: Two times a day (BID) | ORAL | Status: DC
  Administered 2015-12-02 (×2): 37.5 mg via ORAL
  Filled 2015-12-02 (×2): qty 1

## 2015-12-02 NOTE — Discharge Instructions (Signed)
Coronary Artery Bypass Grafting, Care After °Refer to this sheet in the next few weeks. These instructions provide you with information on caring for yourself after your procedure. Your health care provider may also give you more specific instructions. Your treatment has been planned according to current medical practices, but problems sometimes occur. Call your health care provider if you have any problems or questions after your procedure. °WHAT TO EXPECT AFTER THE PROCEDURE °Recovery from surgery will be different for everyone. Some people feel well after 3 or 4 weeks, while for others it takes longer. After your procedure, it is typical to have the following: °· Nausea and a lack of appetite.   °· Constipation. °· Weakness and fatigue.   °· Depression or irritability.   °· Pain or discomfort at your incision site. °HOME CARE INSTRUCTIONS °· Take medicines only as directed by your health care provider. Do not stop taking medicines or start any new medicines without first checking with your health care provider. °· Take your pulse as directed by your health care provider. °· Perform deep breathing as directed by your health care provider. If you were given a device called an incentive spirometer, use it to practice deep breathing several times a day. Support your chest with a pillow or your arms when you take deep breaths or cough. °· Keep incision areas clean, dry, and protected. Remove or change any bandages (dressings) only as directed by your health care provider. You may have skin adhesive strips over the incision areas. Do not take the strips off. They will fall off on their own. °· Check incision areas daily for any swelling, redness, or drainage. °· If incisions were made in your legs, do the following: °¨ Avoid crossing your legs.   °¨ Avoid sitting for long periods of time. Change positions every 30 minutes.   °¨ Elevate your legs when you are sitting. °· Wear compression stockings as directed by your  health care provider. These stockings help keep blood clots from forming in your legs. °· Take showers once your health care provider approves. Until then, only take sponge baths. Pat incisions dry. Do not rub incisions with a washcloth or towel. Do not take baths, swim, or use a hot tub until your health care provider approves. °· Eat foods that are high in fiber, such as raw fruits and vegetables, whole grains, beans, and nuts. Meats should be lean cut. Avoid canned, processed, and fried foods. °· Drink enough fluid to keep your urine clear or pale yellow. °· Weigh yourself every day. This helps identify if you are retaining fluid that may make your heart and lungs work harder. °· Rest and limit activity as directed by your health care provider. You may be instructed to: °¨ Stop any activity at once if you have chest pain, shortness of breath, irregular heartbeats, or dizziness. Get help right away if you have any of these symptoms. °¨ Move around frequently for short periods or take short walks as directed by your health care provider. Increase your activities gradually. You may need physical therapy or cardiac rehabilitation to help strengthen your muscles and build your endurance. °¨ Avoid lifting, pushing, or pulling anything heavier than 10 lb (4.5 kg) for at least 6 weeks after surgery. °· Do not drive until your health care provider approves.  °· Ask your health care provider when you may return to work. °· Ask your health care provider when you may resume sexual activity. °· Keep all follow-up visits as directed by your health care   provider. This is important. °SEEK MEDICAL CARE IF: °· You have swelling, redness, increasing pain, or drainage at the site of an incision. °· You have a fever. °· You have swelling in your ankles or legs. °· You have pain in your legs.   °· You gain 2 or more pounds (0.9 kg) a day. °· You are nauseous or vomit. °· You have diarrhea.  °SEEK IMMEDIATE MEDICAL CARE IF: °· You have  chest pain that goes to your jaw or arms. °· You have shortness of breath.   °· You have a fast or irregular heartbeat.   °· You notice a "clicking" in your breastbone (sternum) when you move.   °· You have numbness or weakness in your arms or legs. °· You feel dizzy or light-headed.   °MAKE SURE YOU: °· Understand these instructions. °· Will watch your condition. °· Will get help right away if you are not doing well or get worse. °  °This information is not intended to replace advice given to you by your health care provider. Make sure you discuss any questions you have with your health care provider. °  °Document Released: 03/05/2005 Document Revised: 09/06/2014 Document Reviewed: 01/23/2013 °Elsevier Interactive Patient Education ©2016 [employer] ° °

## 2015-12-02 NOTE — Progress Notes (Signed)
Utilization review completed.  

## 2015-12-02 NOTE — Discharge Summary (Signed)
Physician Discharge Summary       [address]       [place] [address]             [phone]    Patient ID: [name] MRN: [id number] DOB/AGE: Mar 15, 1956 60 y.o.  Admit date: 11/28/2015 Discharge date: 12/04/2015  Admission Diagnoses: 1.STEMI (ST elevation myocardial infarction) ([place]) 2. Multivessel CAD  Active Diagnoses:  1. Tobacco abuse 2. Esophageal reflux-eosinophil esophagitis 3. Hepatitis C-Tx by [facility] for Hep. negative for hep c 2/15. 4. Tobacco abuse 5. Colitis 6. Neck pain 7. Anxiety 8. ABL anemia 9. Thrombocytopenia    Procedure (s):  Coronary Balloon Angioplasty    Left Heart Cath and Coronary Angiography   Coronary/Graft Angiography by Dr. Irish Lack on 11/28/2015.    Conclusion     Prox RCA lesion, 80% stenosed.  Ost Cx lesion, 90% stenosed.  Prox Cx lesion, 50% stenosed.  Ost LAD to Prox LAD lesion, 100% stenosed. Post intervention with balloon angioplasty, there is a 40% residual stenosis. I suspect there will be recoil of this lesion and there should be no problem with bypass grafting with LIMA to the LAD.  Successful intra-aortic balloon pump placement.  Ostial LAD occlusion is a culprit for his symptoms. Significant disease in his ostial circumflex and proximal right as well. Patient had balloon pump placed and then was sent to the operating room for bypass surgery.     Emergent Median Sternotomy  Extracorporeal circulation 3. Coronary artery bypass grafting x 5   Left internal mammary graft to the LAD  SVG to diagonal  Sequential SVG to Ramus and OM  SVG to PDA 4. Endoscopic vein harvest from the right leg History of Presenting Illness: The patient is a 60 year old gentleman with treated Hep C, smoker but no prior cardiac history who developed acute severe SSCP this am at rest while working at his desk. The pain radiated to left arm and associated with shortness of breath. Brought in by EMS with ECG  showing anterior ST elevation. Taken to cath lab emergently in stable condition. Cath shows ostial/proximal LAD/diagonal occlusion with a tight ostial/proximal LCX stenosis and significant RCA disease. No LV gram done. The LAD/D were opened with PTCA to reestablish flow but this area is severely diseased and wires left in on Angiomax. IABP put in empirically for anatomy. His ST elevations resolved with opening the vessel and BP has been stable. Patient has severe multi-vessel CAD s/p acute anterior STEMI due to LAD/diagnonal occlusion. He has complex disease involving the ostium of the LAD/D, ramus and LCX as well as significant RCA disease. This is best treated with CABG. Will keep the wires down the coronaries on Angiomax and plan CABG as soon as an operating room opens up. Potential risks, complications, and benefits of the surgery were discussed with the patient and he agreed to proceed with surgery. He underwent emergent CABG x 5 on 11/28/2015.  Brief Hospital Course:  The patient was extubated the evening of surgery without difficulty. He/she remained afebrile and hemodynamically stable. Gordy Councilman, a line, chest tubes, and foley were removed early in the post operative course. Lopressor was started and titrated accordingly. He was volume over loaded and diuresed. He had ABL anemia. He did not require a post op transfusion. His last H and H was 8.5 and 25.7. He had thrombocytopenia. Last platelet count was 105,000. He was weaned off the insulin drip. The patient's HGA1C pre op was 5.4. The patient was  felt surgically stable for transfer from the ICU to [facility] for further convalescence on 12/01/2015. He continues to progress with cardiac rehab. He was ambulating on room air. He has been tolerating a diet and has had a bowel movement. Epicardial pacing wires will be removed prior to discharge.  Chest tube sutures will be removed after discharge in the office,The patient is felt surgically stable for discharge  today.  Latest Vital Signs: Blood pressure 132/81, pulse 83, temperature 98.5 F (36.9 C), temperature source Oral, resp. rate 18, height '5\' 9"'$  (1.753 m), weight 184 lb 6.4 oz (83.643 kg), SpO2 97 %.  Physical Exam: General appearance: alert, cooperative and no distress Heart: regular rate and rhythm Lungs: clear to auscultation bilaterally Abdomen: benign Extremities: trace edema Wound: incis healing well  Discharge Condition:Stable and discharged to home  Recent laboratory studies:  Lab Results  Component Value Date   WBC 8.2 12/03/2015   HGB 8.7* 12/03/2015   HCT 25.8* 12/03/2015   MCV 92.5 12/03/2015   PLT 196 12/03/2015   Lab Results  Component Value Date   NA 139 12/02/2015   K 4.2 12/02/2015   CL 104 12/02/2015   CO2 27 12/02/2015   CREATININE 1.09 12/02/2015   GLUCOSE 120* 12/02/2015    Diagnostic Studies:  Dg Chest Port 1 View  12/01/2015  CLINICAL DATA:  CABG. EXAM: PORTABLE CHEST 1 VIEW COMPARISON:  11/30/2015. FINDINGS: Interim removal of right IJ line. Prior CABG. Stable mild cardiomegaly. No pulmonary venous congestion. Bibasilar subsegmental atelectasis. No pleural effusion or pneumothorax. IMPRESSION: 1.  Interim removal of right IJ line and right IJ sheath. 2. Mild bibasilar subsegmental atelectasis. 3. Prior CABG.  Mild cardiomegaly.  No pulmonary venous congestion. Electronically Signed   By: Marcello Moores  Register   On: 12/01/2015 07:40   Discharge Medications:   Medication List    STOP taking these medications        aluminum hydroxide-magnesium carbonate 95-358 MG/15ML Susp  Commonly known as:  GAVISCON     traZODone 50 MG tablet  Commonly known as:  DESYREL     zolpidem 10 MG tablet  Commonly known as:  AMBIEN      TAKE these medications        aspirin 325 MG EC tablet  Take 1 tablet (325 mg total) by mouth daily.     chlordiazePOXIDE 25 MG capsule  Commonly known as:  LIBRIUM  Take 25 mg by mouth daily as needed for anxiety.      guaiFENesin 600 MG 12 hr tablet  Commonly known as:  MUCINEX  Take 600 mg by mouth at bedtime as needed for cough or to loosen phlegm.     lisinopril 5 MG tablet  Commonly known as:  PRINIVIL,ZESTRIL  Take 1 tablet (5 mg total) by mouth daily.     metoprolol 50 MG tablet  Commonly known as:  LOPRESSOR  Take 1 tablet (50 mg total) by mouth 2 (two) times daily.     ONE-A-DAY MENS 50+ ADVANTAGE PO  Take 2 tablets by mouth daily.     pantoprazole 40 MG tablet  Commonly known as:  PROTONIX  Take 1 tablet (40 mg total) by mouth daily before breakfast.     simvastatin 20 MG tablet  Commonly known as:  ZOCOR  Take 1 tablet (20 mg total) by mouth daily at 6 PM.     traMADol 50 MG tablet  Commonly known as:  ULTRAM  Take 1-2 tablets (50-100 mg total) by mouth  every 6 (six) hours as needed for moderate pain.       The patient has been discharged on:   1.Beta Blocker:  Yes [x   ]                              No   [   ]                              If No, reason:  2.Ace Inhibitor/ARB: Yes [  x ]                                     No  [    ]                                     If No, reason:  3.Statin:   Yes [  x ]                  No  [   ]                  If No, reason:  4.Ecasa:  Yes  [  x ]                  No   [   ]                  If No, reason: Follow Up Appointments: Follow-up Information    Follow up with [name]., MD.   Specialties:  Cardiology, Radiology, Interventional Cardiology   Why:  Appointment time is at   Contact information:   [address] [place] [address] [phone]       Follow up with Gaye Pollack, MD On 12/31/2015.   Specialty:  Cardiothoracic Surgery   Why:  PA/LAT CXR to be taken (at [facility] which is in the same building as Dr. Vivi Martens office) on 12/31/2015 at 11:45 am;Appointment time is at  12:30 pm   Contact information:   [place] [address] [phone]        Follow up with Nurse On 12/08/2015.   Why:  Appointment is with nurse only for chest tube suture removal   Contact information:   [place] [address] [phone]      Signed: Macy Mis 12/04/2015, 8:21 AM

## 2015-12-02 NOTE — Progress Notes (Signed)
CARDIAC REHAB PHASE I   PRE:  Rate/Rhythm: 93 SR  BP:  Sitting: 137/82        SaO2: 100 RA  MODE:  Ambulation: 500 ft   POST:  Rate/Rhythm: 102 ST  BP:  Sitting: 139/77        SaO2: 100 RA  Pt very anxious, c/o generally not feeling well. Pt reluctant to walk, agreeable with much encouragement, emotional support. Pt ambulated 500 ft on RA, rolling walker, steady gait, tolerated well, c/o DOE, mild dizziness, brief standing rest x2. Pt c/o pain, requesting pain medicine, RN states pain medicine not due for another 4-5 hours. Pt also states he takes takes 2 librium for anxiety daily and states he does not think he has received this since admission, RN notified. Encouraged IS, additional ambulation x 2 today. Pt verbalized understanding. Pt to bed per pt request after walk, call bell within reach. Will follow.  [phone] Lenna Sciara, RN, BSN 12/02/2015 11:31 AM

## 2015-12-02 NOTE — Progress Notes (Addendum)
[address]       [place] [address]             [phone]      4 Days Post-Op Procedure(s) (LRB): CORONARY ARTERY BYPASS GRAFTING (CABG) TIMES FIVE USING LEFT INTERNAL MAMMARY ARTERY AND RIGHT GREATER SAPHENOUS,VIEN HARVEATED BY ENDOVIEN, INTRAOPPRATIVE TEE (N/A) Subjective: Much more sore today  Objective: Vital signs in last 24 hours: Temp:  [98.2 F (36.8 C)-100 F (37.8 C)] 98.4 F (36.9 C) (04/04 0452) Pulse Rate:  [92-108] 102 (04/04 0727) Cardiac Rhythm:  [-] Normal sinus rhythm (04/03 2045) Resp:  [18-28] 18 (04/04 0452) BP: (109-138)/(59-83) 138/83 mmHg (04/04 0727) SpO2:  [94 %-100 %] 95 % (04/04 0452) Weight:  [186 lb 4.6 oz (84.5 kg)] 186 lb 4.6 oz (84.5 kg) (04/04 0457)  Hemodynamic parameters for last 24 hours:    Intake/Output from previous day: 04/03 0701 - 04/04 0700 In: 410 [P.O.:360; IV Piggyback:50] Out: 1200 [Urine:1200] Intake/Output this shift:    General appearance: alert, cooperative and no distress Heart: regular rate and rhythm Lungs: clear to auscultation bilaterally Abdomen: benign Extremities: no edema Wound: incis healing well  Lab Results:  Recent Labs  11/30/15 0357 12/01/15 0224  WBC 14.1* 9.9  HGB 9.6* 8.5*  HCT 29.9* 25.7*  PLT 124* 105*   BMET:  Recent Labs  11/30/15 0357 12/01/15 0224  NA 134* 136  K 4.0 4.4  CL 104 104  CO2 24 26  GLUCOSE 114* 114*  BUN 10 14  CREATININE 0.97 1.00  CALCIUM 8.2* 8.4*    PT/INR: No results for input(s): LABPROT, INR in the last 72 hours. ABG    Component Value Date/Time   PHART 7.415 11/29/2015 0241   HCO3 27.7* 11/29/2015 0241   TCO2 23 11/29/2015 1643   ACIDBASEDEF 1.0 11/29/2015 0056   O2SAT 98.0 11/29/2015 0241   CBG (last 3)   Recent Labs  11/29/15 2335 11/30/15 0347 11/30/15 0815  GLUCAP 93 106* 104*    Meds Scheduled Meds: . aspirin EC  325 mg Oral Daily  . cefTRIAXone (ROCEPHIN)  IV  1 g Intravenous Q24H  . colchicine   0.6 mg Oral Daily  . enoxaparin (LOVENOX) injection  40 mg Subcutaneous Q24H  . furosemide  40 mg Oral Daily  . guaiFENesin  600 mg Oral BID  . metoprolol tartrate  25 mg Oral BID  . pantoprazole  40 mg Oral QAC breakfast  . potassium chloride  20 mEq Oral Daily  . simvastatin  20 mg Oral q1800  . sodium chloride flush  3 mL Intravenous Q12H   Continuous Infusions:  PRN Meds:.sodium chloride, acetaminophen, chlordiazePOXIDE, ketorolac, ondansetron **OR** ondansetron (ZOFRAN) IV, sodium chloride flush, traMADol, zolpidem  Xrays Dg Chest Port 1 View  12/01/2015  CLINICAL DATA:  CABG. EXAM: PORTABLE CHEST 1 VIEW COMPARISON:  11/30/2015. FINDINGS: Interim removal of right IJ line. Prior CABG. Stable mild cardiomegaly. No pulmonary venous congestion. Bibasilar subsegmental atelectasis. No pleural effusion or pneumothorax. IMPRESSION: 1.  Interim removal of right IJ line and right IJ sheath. 2. Mild bibasilar subsegmental atelectasis. 3. Prior CABG.  Mild cardiomegaly.  No pulmonary venous congestion. Electronically Signed   By: Marcello Moores  Register   On: 12/01/2015 07:40    Assessment/Plan: S/P Procedure(s) (LRB): CORONARY ARTERY BYPASS GRAFTING (CABG) TIMES FIVE USING LEFT INTERNAL MAMMARY ARTERY AND RIGHT GREATER SAPHENOUS,VIEN HARVEATED BY ENDOVIEN, INTRAOPPRATIVE TEE (N/A)  1 doing very well  2 tachy at times- will tol  a little higher beta blocker 3 routine pulm toilet/rehab 4 H/H decreased- follow, other labs stable 5 add low dose ace  LOS: 4 days    GOLD,[name] 12/02/2015   Chart reviewed, patient examined, agree with above. He is doing very well but more sore today than he has been.  Weight is still 11 lbs over preop if accurate so I would continue diuresis.

## 2015-12-03 LAB — CBC
HCT: 25.8 % — ABNORMAL LOW (ref 39.0–52.0)
Hemoglobin: 8.7 g/dL — ABNORMAL LOW (ref 13.0–17.0)
MCH: 31.2 pg (ref 26.0–34.0)
MCHC: 33.7 g/dL (ref 30.0–36.0)
MCV: 92.5 fL (ref 78.0–100.0)
Platelets: 196 10*3/uL (ref 150–400)
RBC: 2.79 MIL/uL — ABNORMAL LOW (ref 4.22–5.81)
RDW: 13.3 % (ref 11.5–15.5)
WBC: 8.2 10*3/uL (ref 4.0–10.5)

## 2015-12-03 MED ORDER — METOPROLOL TARTRATE 50 MG PO TABS
50.0000 mg | ORAL_TABLET | Freq: Two times a day (BID) | ORAL | Status: DC
  Administered 2015-12-03 – 2015-12-04 (×3): 50 mg via ORAL
  Filled 2015-12-03 (×3): qty 1

## 2015-12-03 NOTE — Progress Notes (Signed)
CARDIAC REHAB PHASE I   PRE:  Rate/Rhythm: 93 SR  BP:  Sitting: 122/79        SaO2: 100 RA  MODE:  Ambulation: 550 ft   POST:  Rate/Rhythm: 97 SR  BP:  Sitting: 121/78         SaO2: 100 RA  Pt up ad lib in room, ready to walk. Pt ambulated 550 ft on RA, rolling walker, standby assist, steady gait, tolerated well. Pt c/o incisional pain, mild DOE, mild dizziness, declined rest stop. Encouraged additional ambulation x1 today. Pt to bed per pt request after walk, call bell within reach. Will follow.   [place], RN, BSN 12/03/2015 1:20 PM

## 2015-12-03 NOTE — Progress Notes (Addendum)
      [address]       [place] [address]             [phone]      5 Days Post-Op Procedure(s) (LRB): CORONARY ARTERY BYPASS GRAFTING (CABG) TIMES FIVE USING LEFT INTERNAL MAMMARY ARTERY AND RIGHT GREATER SAPHENOUS,VIEN HARVEATED BY ENDOVIEN, INTRAOPPRATIVE TEE (N/A) Subjective: Feels better today. Less discomfort  Objective: Vital signs in last 24 hours: Temp:  [97.9 F (36.6 C)-99.7 F (37.6 C)] 97.9 F (36.6 C) (04/05 0405) Pulse Rate:  [89-98] 89 (04/05 0405) Cardiac Rhythm:  [-] Normal sinus rhythm (04/04 1910) Resp:  [18] 18 (04/05 0405) BP: (128-129)/(77-84) 128/82 mmHg (04/05 0405) SpO2:  [95 %-98 %] 95 % (04/05 0405) Weight:  [185 lb 3 oz (84 kg)] 185 lb 3 oz (84 kg) (04/05 0313)  Hemodynamic parameters for last 24 hours:    Intake/Output from previous day:   Intake/Output this shift:    General appearance: alert, cooperative and no distress Heart: regular rate and rhythm Lungs: mildly dim in bases Abdomen: benign Extremities: no edema Wound: incis healing well  Lab Results:  Recent Labs  12/01/15 0224 12/03/15 0307  WBC 9.9 8.2  HGB 8.5* 8.7*  HCT 25.7* 25.8*  PLT 105* 196   BMET:  Recent Labs  12/01/15 0224 12/02/15 1016  NA 136 139  K 4.4 4.2  CL 104 104  CO2 26 27  GLUCOSE 114* 120*  BUN 14 24*  CREATININE 1.00 1.09  CALCIUM 8.4* 8.6*    PT/INR: No results for input(s): LABPROT, INR in the last 72 hours. ABG    Component Value Date/Time   PHART 7.415 11/29/2015 0241   HCO3 27.7* 11/29/2015 0241   TCO2 23 11/29/2015 1643   ACIDBASEDEF 1.0 11/29/2015 0056   O2SAT 98.0 11/29/2015 0241   CBG (last 3)   Recent Labs  11/30/15 0815  GLUCAP 104*    Meds Scheduled Meds: . aspirin EC  325 mg Oral Daily  . cefTRIAXone (ROCEPHIN)  IV  1 g Intravenous Q24H  . colchicine  0.6 mg Oral Daily  . enoxaparin (LOVENOX) injection  40 mg Subcutaneous Q24H  . furosemide  40 mg Oral Daily  . guaiFENesin  600 mg  Oral BID  . lisinopril  5 mg Oral Daily  . metoprolol tartrate  37.5 mg Oral BID  . pantoprazole  40 mg Oral QAC breakfast  . potassium chloride  20 mEq Oral Daily  . simvastatin  20 mg Oral q1800  . sodium chloride flush  3 mL Intravenous Q12H   Continuous Infusions:  PRN Meds:.sodium chloride, acetaminophen, chlordiazePOXIDE, ketorolac, ondansetron **OR** ondansetron (ZOFRAN) IV, sodium chloride flush, traMADol, zolpidem  Xrays No results found.  Assessment/Plan: S/P Procedure(s) (LRB): CORONARY ARTERY BYPASS GRAFTING (CABG) TIMES FIVE USING LEFT INTERNAL MAMMARY ARTERY AND RIGHT GREATER SAPHENOUS,VIEN HARVEATED BY ENDOVIEN, INTRAOPPRATIVE TEE (N/A)  1 doing well 2 tachy at times, will go to 50 bid on lopressor- BP should tolerate 3 d/c pacer wires today 4 good diuresis 5 H/H stable 6 prob home in am   LOS: 5 days    Villa,Anthony E 12/03/2015   Chart reviewed, patient examined, agree with above. He looks good and feels much better today. I think he can go home in the am.

## 2015-12-03 NOTE — Progress Notes (Signed)
Patient ambulated approximately 512f with RW and stand-by assist and tolerated well.  Steady on his feet, HR in 90s, and no shortness of breath reported until near the end of the walk.  O2 sat 94%.  Left in room with wife at bedside and call bell within reach.  Will continue to monitor.  LRandell Patient

## 2015-12-03 NOTE — Progress Notes (Signed)
Pacing wires removed. Vital signs taken. Pt educated on hour bed rest and to call nurse or tech if needed. Pt has no complaints of sob dizziness or pain. Will continue to monitor.

## 2015-12-04 MED ORDER — LISINOPRIL 5 MG PO TABS: 5.0000 mg | ORAL_TABLET | Freq: Every day | ORAL | Status: DC

## 2015-12-04 MED ORDER — TRAMADOL HCL 50 MG PO TABS: 50.0000 mg | ORAL_TABLET | Freq: Four times a day (QID) | ORAL | Status: DC | PRN

## 2015-12-04 MED ORDER — SIMVASTATIN 20 MG PO TABS: 20.0000 mg | ORAL_TABLET | Freq: Every day | ORAL | Status: DC

## 2015-12-04 MED ORDER — PANTOPRAZOLE SODIUM 40 MG PO TBEC: 40.0000 mg | DELAYED_RELEASE_TABLET | Freq: Every day | ORAL | Status: DC

## 2015-12-04 MED ORDER — METOPROLOL TARTRATE 50 MG PO TABS: 50.0000 mg | ORAL_TABLET | Freq: Two times a day (BID) | ORAL | Status: DC

## 2015-12-04 MED ORDER — ASPIRIN 325 MG PO TBEC: 325.0000 mg | DELAYED_RELEASE_TABLET | Freq: Every day | ORAL | Status: DC

## 2015-12-04 NOTE — Progress Notes (Signed)
Pt. Given 1000 medications along with toradol IV prior to discharge. Discharge information and education given. All questions answered. Pt. Discharged home with wife. IV discontinued

## 2015-12-04 NOTE — Progress Notes (Signed)
[id number] Education completed with pt and wife who voiced understanding. Encouraged IS. Discussed smoking cessation and gave fake cigarette and smoking cessation handout. Pt is planning on quitting cold [place]. Discussed CRP 2 and referring to [facility] program. Pt does not feel he needs walker for home. Both have seen discharge video. Graylon Good RN BSN 12/04/2015 9:59 AM

## 2015-12-04 NOTE — Progress Notes (Signed)
      [address]       [place] [address]             [phone]      6 Days Post-Op Procedure(s) (LRB): CORONARY ARTERY BYPASS GRAFTING (CABG) TIMES FIVE USING LEFT INTERNAL MAMMARY ARTERY AND RIGHT GREATER SAPHENOUS,VIEN HARVEATED BY ENDOVIEN, INTRAOPPRATIVE TEE (N/A) Subjective: Feels fairly well  Objective: Vital signs in last 24 hours: Temp:  [98.1 F (36.7 C)-99.1 F (37.3 C)] 98.5 F (36.9 C) (04/06 0526) Pulse Rate:  [83-95] 83 (04/06 0526) Cardiac Rhythm:  [-] Normal sinus rhythm (04/05 1900) Resp:  [17-18] 18 (04/06 0526) BP: (125-138)/(76-89) 132/81 mmHg (04/06 0526) SpO2:  [97 %-99 %] 97 % (04/06 0526) Weight:  [184 lb 6.4 oz (83.643 kg)] 184 lb 6.4 oz (83.643 kg) (04/06 0526)  Hemodynamic parameters for last 24 hours:    Intake/Output from previous day: 04/05 0701 - 04/06 0700 In: 240 [P.O.:240] Out: -  Intake/Output this shift:    General appearance: alert, cooperative and no distress Heart: regular rate and rhythm Lungs: clear to auscultation bilaterally Abdomen: benign Extremities: trace edema Wound: incis healing well  Lab Results:  Recent Labs  12/03/15 0307  WBC 8.2  HGB 8.7*  HCT 25.8*  PLT 196   BMET:  Recent Labs  12/02/15 1016  NA 139  K 4.2  CL 104  CO2 27  GLUCOSE 120*  BUN 24*  CREATININE 1.09  CALCIUM 8.6*    PT/INR: No results for input(s): LABPROT, INR in the last 72 hours. ABG    Component Value Date/Time   PHART 7.415 11/29/2015 0241   HCO3 27.7* 11/29/2015 0241   TCO2 23 11/29/2015 1643   ACIDBASEDEF 1.0 11/29/2015 0056   O2SAT 98.0 11/29/2015 0241   CBG (last 3)  No results for input(s): GLUCAP in the last 72 hours.  Meds Scheduled Meds: . aspirin EC  325 mg Oral Daily  . cefTRIAXone (ROCEPHIN)  IV  1 g Intravenous Q24H  . colchicine  0.6 mg Oral Daily  . enoxaparin (LOVENOX) injection  40 mg Subcutaneous Q24H  . guaiFENesin  600 mg Oral BID  . lisinopril  5 mg Oral Daily  .  metoprolol tartrate  50 mg Oral BID  . pantoprazole  40 mg Oral QAC breakfast  . simvastatin  20 mg Oral q1800  . sodium chloride flush  3 mL Intravenous Q12H   Continuous Infusions:  PRN Meds:.sodium chloride, acetaminophen, chlordiazePOXIDE, ketorolac, ondansetron **OR** ondansetron (ZOFRAN) IV, sodium chloride flush, traMADol, zolpidem  Xrays No results found.  Assessment/Plan: S/P Procedure(s) (LRB): CORONARY ARTERY BYPASS GRAFTING (CABG) TIMES FIVE USING LEFT INTERNAL MAMMARY ARTERY AND RIGHT GREATER SAPHENOUS,VIEN HARVEATED BY ENDOVIEN, INTRAOPPRATIVE TEE (N/A) Plan for discharge: see discharge orders   LOS: 6 days    [name] E 12/04/2015

## 2015-12-05 LAB — CULTURE, BLOOD (ROUTINE X 2)
Culture: NO GROWTH
Culture: NO GROWTH

## 2015-12-08 ENCOUNTER — Ambulatory Visit (INDEPENDENT_AMBULATORY_CARE_PROVIDER_SITE_OTHER): Payer: Self-pay

## 2015-12-08 DIAGNOSIS — Z951 Presence of aortocoronary bypass graft: Secondary | ICD-10-CM

## 2015-12-08 DIAGNOSIS — Z4802 Encounter for removal of sutures: Secondary | ICD-10-CM

## 2015-12-08 DIAGNOSIS — G8918 Other acute postprocedural pain: Secondary | ICD-10-CM

## 2015-12-08 MED ORDER — TRAMADOL HCL 50 MG PO TABS: 50.0000 mg | ORAL_TABLET | Freq: Four times a day (QID) | ORAL | Status: DC | PRN

## 2015-12-08 NOTE — Progress Notes (Signed)
Removed 3 sutures from chest tube sites, no signs of infection and patient tolerated well. Tramadol RX was refilled and faxed to pharm. Appt with Cardiologist was sch'ed for 12/25/15. patient aware

## 2015-12-17 ENCOUNTER — Other Ambulatory Visit: Payer: Self-pay | Admitting: *Deleted

## 2015-12-17 DIAGNOSIS — G8918 Other acute postprocedural pain: Secondary | ICD-10-CM

## 2015-12-17 DIAGNOSIS — Z4802 Encounter for removal of sutures: Secondary | ICD-10-CM

## 2015-12-17 MED ORDER — TRAMADOL HCL 50 MG PO TABS
50.0000 mg | ORAL_TABLET | Freq: Four times a day (QID) | ORAL | Status: DC | PRN
Start: 2015-12-17 — End: 2015-12-18

## 2015-12-17 NOTE — Telephone Encounter (Signed)
[name] has called for a pain med refill.  He is concerned with his pain and wondering if he should be having this kind  of pain 3 weeks after his surgery. He explains it as a healing pain mostly in the center of his chest, rarely radiating anywhere else. I reassured him and encouraged him to take the pain med if it helps. He does not want to take too much, but I reassured him that is in the very post operative period.  I will fax a new signed script to his pharmacy and he agreed.

## 2015-12-18 ENCOUNTER — Other Ambulatory Visit: Payer: Self-pay

## 2015-12-18 DIAGNOSIS — G8918 Other acute postprocedural pain: Secondary | ICD-10-CM

## 2015-12-18 DIAGNOSIS — Z4802 Encounter for removal of sutures: Secondary | ICD-10-CM

## 2015-12-18 MED ORDER — TRAMADOL HCL 50 MG PO TABS: 50.0000 mg | ORAL_TABLET | Freq: Four times a day (QID) | ORAL | Status: DC | PRN

## 2015-12-18 NOTE — Telephone Encounter (Signed)
Reprint RX for tramadol and faxed to [facility]

## 2015-12-24 NOTE — Progress Notes (Signed)
Cardiology Office Note:    Date:  12/25/2015   ID:  [name], DOB 23-Mar-1956, MRN [id number]  PCP:  [name], MD  Cardiologist:  Dr. Casandra Doffing   Electrophysiologist:  n/a  Referring MD: [name], MD   Chief Complaint  Patient presents with  . Hospitalization Follow-up    s/p Ant MI >> CABG    History of Present Illness:     [name] is a 60 y.o. male with a hx of HCV s/p treatment, tobacco abuse.  Admitted 3/31-4/6 with anterior STEMI.  Emergent LHC demonstrated severe 3 v CAD with ostial LAD occlusion.  LAD was opened with POBA. IABP was placed and he was seen emergently by Dr. Cyndia Bent.  He underwent CABG with L-LAD, S-Dx, S-RI/OM, S-PDA.  Post op course was notable for volume overload, anemia (did not require transfusion), thrombocytopenia.  Otherwise, post op course was uneventful.  No LV gram done at [facility].  Intraop TEE demonstrated EF 25-30% with anterior, ant-septal and apical AK.    Returns for FU.  He is here with his wife.  Still weak and chest is sore.  Feeling better.  No syncope.  Feels dizzy.  No orthopnea, PND.  Slight edema improved.  No fever.  Appetite improved.     Past Medical History  Diagnosis Date  . Anxiety   . Esophageal reflux     eosinophil esophagitis  . Acute hepatitis C without mention of hepatic coma     Tx by [facility] for Hep.  negative for hep c 2/15.  . Colitis   . CAD (coronary artery disease)     a. ant STEMI >> LHC with 3 v CAD; oLAD tx with POBA >> emergent CABG  . Ischemic cardiomyopathy     a. EF 25-30% at intraop TEE 4/17  . Chronic systolic CHF (congestive heart failure) [facility])     Past Surgical History  Procedure Laterality Date  . Shoulder surgery    . Cardiac catheterization N/A 11/28/2015    Procedure: Left Heart Cath and Coronary Angiography;  Surgeon: [name], MD;  Location: [place] [facility];  Service: Cardiovascular;  Laterality: N/A;  . Cardiac catheterization N/A 11/28/2015    Procedure:  Coronary Balloon Angioplasty;  Surgeon: [name], MD;  Location: [place] [facility];  Service: Cardiovascular;  Laterality: N/A;  ostial LAD  . Cardiac catheterization N/A 11/28/2015    Procedure: Coronary/Graft Angiography;  Surgeon: [name], MD;  Location: [place] [facility];  Service: Cardiovascular;  Laterality: N/A;  coronaries only   . Coronary artery bypass graft N/A 11/28/2015    Procedure: CORONARY ARTERY BYPASS GRAFTING (CABG) TIMES FIVE USING LEFT INTERNAL MAMMARY ARTERY AND RIGHT GREATER SAPHENOUS,VIEN HARVEATED BY ENDOVIEN, INTRAOPPRATIVE TEE;  Surgeon: Gaye Pollack, MD;  Location: [place];  Service: Open Heart Surgery;  Laterality: N/A;    Current Medications: Current Meds  Medication Sig  . aspirin 81 MG EC tablet Take 1 tablet (81 mg total) by mouth daily.  . chlordiazePOXIDE (LIBRIUM) 25 MG capsule Take 25 mg by mouth daily as needed for anxiety.   Marland Kitchen lisinopril (PRINIVIL,ZESTRIL) 5 MG tablet Take 1 tablet (5 mg total) by mouth daily.  . metoprolol (LOPRESSOR) 50 MG tablet Take 0.5 tablets (25 mg total) by mouth 2 (two) times daily.  . Multiple Vitamins-Minerals (ONE-A-DAY MENS 50+ ADVANTAGE PO) Take 2 tablets by mouth daily.   . pantoprazole (PROTONIX) 40 MG tablet Take 1 tablet (40 mg total) by  mouth daily before breakfast.  . simvastatin (ZOCOR) 20 MG tablet Take 1 tablet (20 mg total) by mouth daily at 6 PM.  . traMADol (ULTRAM) 50 MG tablet Take 1-2 tablets (50-100 mg total) by mouth every 6 (six) hours as needed for moderate pain.  . traZODone (DESYREL) 50 MG tablet 50 mg at bedtime as needed for sleep.   Marland Kitchen zolpidem (AMBIEN) 10 MG tablet Take 10 mg by mouth at bedtime as needed for sleep.   . [DISCONTINUED] aspirin EC 325 MG EC tablet Take 1 tablet (325 mg total) by mouth daily.  . [DISCONTINUED] metoprolol (LOPRESSOR) 50 MG tablet Take 1 tablet (50 mg total) by mouth 2 (two) times daily.      Allergies:   Prednisone; Tetanus toxoids; Wellbutrin;  and Chantix   Social History   Social History  . Marital Status: Married    Spouse Name: [name]  . Number of Children: 3  . Years of Education: College   Occupational History  . Self-employed    Social History Main Topics  . Smoking status: Current Every Day Smoker -- 0.50 packs/day    Types: Cigarettes  . Smokeless tobacco: Never Used  . Alcohol Use: No  . Drug Use: No  . Sexual Activity: Not Asked   Other Topics Concern  . None   Social History Narrative   Patient lives at home with his spouse.   Caffeine Use: yes     Family History:  The patient's family history includes Heart Problems in his father; Lung cancer in his mother. There is no history of Autoimmune disease.   ROS:   Please see the history of present illness.    ROS All other systems reviewed and are negative.   Physical Exam:    VS:  BP 102/60 mmHg  Pulse 54  Ht '5\' 9"'$  (1.753 m)  Wt 169 lb 6.4 oz (76.839 kg)  BMI 25.00 kg/m2   GEN: Well nourished, well developed, in no acute distress HEENT: normal Neck: no JVD, no masses Cardiac: Normal S1/S2, RRR; no murmurs, rubs, or gallops, no edema;  no carotid bruits Chest - Median sternotomy well healed without erythema or d/c   Respiratory:  clear to auscultation bilaterally; no wheezing, rhonchi or rales GI: soft, nontender, nondistended MS: no deformity or atrophy Skin: warm and dry Neuro: No focal deficits  Psych: Alert and oriented x 3, normal affect  Wt Readings from Last 3 Encounters:  12/25/15 169 lb 6.4 oz (76.839 kg)  12/04/15 184 lb 6.4 oz (83.643 kg)  11/14/15 175 lb (79.379 kg)      Studies/Labs Reviewed:     EKG:  EKG is   ordered today.  The ekg ordered today demonstrates Sinus bradycardia, HR 55, T-wave inversion 1, aVL, V2, V5, QTc 392 ms, low voltage  Recent Labs: 11/28/2015: ALT 24 11/29/2015: Magnesium 2.5* 12/25/2015: BUN 22; Creat 1.23; Hemoglobin 13.0*; Platelets 346; Potassium 5.3; Sodium 141   Recent Lipid Panel      Component Value Date/Time   CHOL 202* 11/28/2015 1010   TRIG 167* 11/28/2015 1010   HDL 42 11/28/2015 1010   CHOLHDL 4.8 11/28/2015 1010   VLDL 33 11/28/2015 1010   LDLCALC 127* 11/28/2015 1010    Additional studies/ records that were reviewed today include:   Intraoperative TEE EF 25-30%  LHC 11/28/15 LAD ostial 100% >> PCI: POBA only with 40% residual LCx ostial 90%, proximal 50% RCA proximal 80% Ostial LAD occlusion is a culprit for his symptoms. Significant  disease in his ostial circumflex and proximal right as well. Patient had balloon pump placed and then was sent to the operating room for bypass surgery.   ASSESSMENT:     1. Coronary artery disease involving native coronary artery of native heart without angina pectoris   2. Ischemic cardiomyopathy   3. Hyperlipidemia     PLAN:     In order of problems listed above:  1. CAD - s/p Anterior STEMI tx with POBA to the LAD and subsequent emergent CABG.  He is slowly progressing.  Still has some memory issues.  He is anxious to increase activity and go back to work.  He does get dizzy sometimes and his HR and BP are low today.  He sees Dr. Cyndia Bent next week.   -  Change ASA to 81 mg QD.  -  Add Plavix 75 mg QD  -  Continue statin >> will check with Dr. Casandra Doffing if continue Simva 20 or increase to Simva 40.  -  Continue beta-blocker, ACE inhibitor.  Decrease Metoprolol to 25 mg bid.   2. Ischemic CM - s/p Anterior STEMI with EF 25-30% and anterior and apical AK on TEE in the OR.  No formal Echo done post Op.  Not sure why he did not get assessed for LifeVest.  D/w Dr. Liam Rogers (DOD) in the office today.    -  Arrange Echo to recheck EF.  If EF < 35% - arrange LifeVest  -  Continue beta-blocker, ACE inhibitor.  3. HL - Currently on Simva 20.  Notes indicate that Dr. Casandra Doffing started this.  Will check with him on dosage as mod to high intensity statin would be ideal.  Check Lipids and LFTs in 6 weeks.      Medication Adjustments/Labs and Tests Ordered: Current medicines are reviewed at length with the patient today.  Concerns regarding medicines are outlined above.  Medication changes, Labs and Tests ordered today are outlined in the Patient Instructions noted below. Patient Instructions  Medication Instructions:  Your physician has recommended you make the following change in your medication:  1.  DECREASE the Lopressor to 25 mg taking 1 tablet twice a day 2.  DECREASE the Aspirin to 81 mg taking 1 tablet daily 3.  START Plavix 75 mg taking 1 tablet daily Labwork: TODAY:  BMET & CBC 6 WEEKS (02/05/16) FASTING LABS:  LIPID & LFT Testing/Procedures: Your physician has requested that you have an echocardiogram ASAP  Echocardiography is a painless test that uses sound waves to create images of your heart. It provides your doctor with information about the size and shape of your heart and how well your heart's chambers and valves are working. This procedure takes approximately one hour. There are no restrictions for this procedure. Follow-Up: Your physician recommends that you schedule a follow-up appointment in: [place] DR. VARANASI Any Other Special Instructions Will Be Listed Below (If Applicable). If you need a refill on your cardiac medications before your next appointment, please call your pharmacy.   Signed, [name], PA-C  12/25/2015 5:11 PM    [facility] [address], [place], [place]  [address] Phone: [phone]; Fax: [phone]

## 2015-12-25 ENCOUNTER — Encounter: Payer: Self-pay | Admitting: Physician Assistant

## 2015-12-25 ENCOUNTER — Ambulatory Visit (INDEPENDENT_AMBULATORY_CARE_PROVIDER_SITE_OTHER): Payer: BC Managed Care – PPO | Admitting: Physician Assistant

## 2015-12-25 VITALS — BP 102/60 | HR 54 | Ht 69.0 in | Wt 169.4 lb

## 2015-12-25 DIAGNOSIS — I2510 Atherosclerotic heart disease of native coronary artery without angina pectoris: Secondary | ICD-10-CM | POA: Diagnosis not present

## 2015-12-25 DIAGNOSIS — E785 Hyperlipidemia, unspecified: Secondary | ICD-10-CM | POA: Insufficient documentation

## 2015-12-25 DIAGNOSIS — I255 Ischemic cardiomyopathy: Secondary | ICD-10-CM | POA: Insufficient documentation

## 2015-12-25 LAB — CBC WITH DIFFERENTIAL/PLATELET
Basophils Absolute: 110 cells/uL (ref 0–200)
Basophils Relative: 1 %
Eosinophils Absolute: 330 cells/uL (ref 15–500)
Eosinophils Relative: 3 %
HCT: 40.3 % (ref 38.5–50.0)
Hemoglobin: 13 g/dL — ABNORMAL LOW (ref 13.2–17.1)
Lymphocytes Relative: 28 %
Lymphs Abs: 3080 cells/uL (ref 850–3900)
MCH: 29 pg (ref 27.0–33.0)
MCHC: 32.3 g/dL (ref 32.0–36.0)
MCV: 90 fL (ref 80.0–100.0)
MPV: 11.2 fL (ref 7.5–12.5)
Monocytes Absolute: 660 cells/uL (ref 200–950)
Monocytes Relative: 6 %
Neutro Abs: 6820 cells/uL (ref 1500–7800)
Neutrophils Relative %: 62 %
Platelets: 346 10*3/uL (ref 140–400)
RBC: 4.48 MIL/uL (ref 4.20–5.80)
RDW: 13.4 % (ref 11.0–15.0)
WBC: 11 10*3/uL — ABNORMAL HIGH (ref 3.8–10.8)

## 2015-12-25 LAB — BASIC METABOLIC PANEL
BUN: 22 mg/dL (ref 7–25)
CO2: 25 mmol/L (ref 20–31)
Calcium: 10.7 mg/dL — ABNORMAL HIGH (ref 8.6–10.3)
Chloride: 105 mmol/L (ref 98–110)
Creat: 1.23 mg/dL (ref 0.70–1.25)
Glucose, Bld: 102 mg/dL — ABNORMAL HIGH (ref 65–99)
Potassium: 5.3 mmol/L (ref 3.5–5.3)
Sodium: 141 mmol/L (ref 135–146)

## 2015-12-25 MED ORDER — ASPIRIN 81 MG PO TBEC: 81.0000 mg | DELAYED_RELEASE_TABLET | Freq: Every day | ORAL | Status: DC

## 2015-12-25 MED ORDER — CLOPIDOGREL BISULFATE 75 MG PO TABS: 75.0000 mg | ORAL_TABLET | Freq: Every day | ORAL | Status: DC

## 2015-12-25 MED ORDER — METOPROLOL TARTRATE 50 MG PO TABS: 25.0000 mg | ORAL_TABLET | Freq: Two times a day (BID) | ORAL | Status: DC

## 2015-12-25 NOTE — Patient Instructions (Addendum)
Medication Instructions:  Your physician has recommended you make the following change in your medication:  1.  DECREASE the Lopressor to 25 mg taking 1 tablet twice a day 2.  DECREASE the Aspirin to 81 mg taking 1 tablet daily 3.  START Plavix 75 mg taking 1 tablet daily Labwork: TODAY:  BMET & CBC 6 WEEKS (02/05/16) FASTING LABS:  LIPID & LFT Testing/Procedures: Your physician has requested that you have an echocardiogram ASAP  Echocardiography is a painless test that uses sound waves to create images of your heart. It provides your doctor with information about the size and shape of your heart and how well your heart's chambers and valves are working. This procedure takes approximately one hour. There are no restrictions for this procedure. Follow-Up: Your physician recommends that you schedule a follow-up appointment in: [place] DR. VARANASI Any Other Special Instructions Will Be Listed Below (If Applicable).  Echocardiogram An echocardiogram, or echocardiography, uses sound waves (ultrasound) to produce an image of your heart. The echocardiogram is simple, painless, obtained within a short period of time, and offers valuable information to your health care provider. The images from an echocardiogram can provide information such as:  Evidence of coronary artery disease (CAD).  Heart size.  Heart muscle function.  Heart valve function.  Aneurysm detection.  Evidence of a past heart attack.  Fluid buildup around the heart.  Heart muscle thickening.  Assess heart valve function. LET [facility] CARE PROVIDER KNOW ABOUT:  Any allergies you have.  All medicines you are taking, including vitamins, herbs, eye drops, creams, and over-the-counter medicines.  Previous problems you or members of your family have had with the use of anesthetics.  Any blood disorders you have.  Previous surgeries you have had.  Medical conditions you have.  Possibility of pregnancy, if  this applies. BEFORE THE PROCEDURE  No special preparation is needed. Eat and drink normally.  PROCEDURE   In order to produce an image of your heart, gel will be applied to your chest and a wand-like tool (transducer) will be moved over your chest. The gel will help transmit the sound waves from the transducer. The sound waves will harmlessly bounce off your heart to allow the heart images to be captured in real-time motion. These images will then be recorded.  You may need an IV to receive a medicine that improves the quality of the pictures. AFTER THE PROCEDURE You may return to your normal schedule including diet, activities, and medicines, unless your health care provider tells you otherwise.   This information is not intended to replace advice given to you by your health care provider. Make sure you discuss any questions you have with your health care provider.   Document Released: 08/13/2000 Document Revised: 09/06/2014 Document Reviewed: 04/23/2013 Elsevier Interactive Patient Education [employer].    If you need a refill on your cardiac medications before your next appointment, please call your pharmacy.

## 2015-12-26 ENCOUNTER — Other Ambulatory Visit: Payer: Self-pay | Admitting: *Deleted

## 2015-12-26 DIAGNOSIS — Z4802 Encounter for removal of sutures: Secondary | ICD-10-CM

## 2015-12-26 DIAGNOSIS — G8918 Other acute postprocedural pain: Secondary | ICD-10-CM

## 2015-12-26 MED ORDER — TRAMADOL HCL 50 MG PO TABS: 50.0000 mg | ORAL_TABLET | Freq: Four times a day (QID) | ORAL | Status: DC | PRN

## 2015-12-26 NOTE — Telephone Encounter (Signed)
[name] has called and requested a refill for Tramadol s/p CABG.  He is concerned about post op pain and his being unable to focus , periods of sadness and tearfulness.  He is afraid this will be long lasting or permanent and is becoming very frustrated.  I tried to reassure him. I encouraged him to continue walking, stay active and see his PCP for a discussion.  Otherwise, he will soon be participating in cardiac rehab and discussing this further with his physicians which will help allay his fears. Anew Tramadol script was faxed to his pharmacy.

## 2015-12-30 ENCOUNTER — Other Ambulatory Visit: Payer: Self-pay | Admitting: Physician Assistant

## 2015-12-30 ENCOUNTER — Ambulatory Visit (HOSPITAL_COMMUNITY): Payer: BC Managed Care – PPO | Attending: Internal Medicine

## 2015-12-30 ENCOUNTER — Other Ambulatory Visit: Payer: Self-pay | Admitting: Surgery

## 2015-12-30 ENCOUNTER — Other Ambulatory Visit: Payer: Self-pay

## 2015-12-30 DIAGNOSIS — Z720 Tobacco use: Secondary | ICD-10-CM | POA: Diagnosis not present

## 2015-12-30 DIAGNOSIS — I2510 Atherosclerotic heart disease of native coronary artery without angina pectoris: Secondary | ICD-10-CM

## 2015-12-30 DIAGNOSIS — I255 Ischemic cardiomyopathy: Secondary | ICD-10-CM

## 2015-12-30 DIAGNOSIS — Z951 Presence of aortocoronary bypass graft: Secondary | ICD-10-CM

## 2015-12-30 DIAGNOSIS — I509 Heart failure, unspecified: Secondary | ICD-10-CM | POA: Diagnosis not present

## 2015-12-31 ENCOUNTER — Encounter: Payer: Self-pay | Admitting: Physician Assistant

## 2015-12-31 ENCOUNTER — Ambulatory Visit
Admission: RE | Admit: 2015-12-31 | Discharge: 2015-12-31 | Disposition: A | Discharge status: Home / Self Care | Payer: BC Managed Care – PPO | Source: Ambulatory Visit | Attending: Surgery | Admitting: Surgery

## 2015-12-31 ENCOUNTER — Telehealth: Payer: Self-pay | Admitting: *Deleted

## 2015-12-31 ENCOUNTER — Ambulatory Visit (INDEPENDENT_AMBULATORY_CARE_PROVIDER_SITE_OTHER): Payer: Self-pay | Admitting: Surgery

## 2015-12-31 VITALS — BP 118/79 | HR 61 | Resp 16 | Ht 69.0 in | Wt 172.6 lb

## 2015-12-31 DIAGNOSIS — Z951 Presence of aortocoronary bypass graft: Secondary | ICD-10-CM

## 2015-12-31 DIAGNOSIS — I2510 Atherosclerotic heart disease of native coronary artery without angina pectoris: Secondary | ICD-10-CM

## 2015-12-31 NOTE — Progress Notes (Signed)
HPI: Patient returns for routine postoperative follow-up having undergone emergent CABG x 5 on 11/28/2015. His preop EF was 25-30% and this was unchanged post-bypass on TEE. The patient's early postoperative recovery while in the hospital was notable for an uncomplicated postop course. Since hospital discharge the patient reports that he has been feeling fairly well. He is walking daily and his stamina is improving. He has continued to abstain from smoking.   Current Outpatient Prescriptions  Medication Sig Dispense Refill  . aspirin 81 MG EC tablet Take 1 tablet (81 mg total) by mouth daily.    . chlordiazePOXIDE (LIBRIUM) 25 MG capsule Take 25 mg by mouth daily as needed for anxiety.     . clopidogrel (PLAVIX) 75 MG tablet Take 1 tablet (75 mg total) by mouth daily. 30 tablet 11  . lisinopril (PRINIVIL,ZESTRIL) 5 MG tablet Take 1 tablet (5 mg total) by mouth daily. 30 tablet 1  . metoprolol (LOPRESSOR) 50 MG tablet Take 0.5 tablets (25 mg total) by mouth 2 (two) times daily. 60 tablet 1  . Multiple Vitamins-Minerals (ONE-A-DAY MENS 50+ ADVANTAGE PO) Take 2 tablets by mouth daily.     . pantoprazole (PROTONIX) 40 MG tablet Take 1 tablet (40 mg total) by mouth daily before breakfast. 30 tablet 0  . simvastatin (ZOCOR) 20 MG tablet Take 1 tablet (20 mg total) by mouth daily at 6 PM. 30 tablet 1  . traMADol (ULTRAM) 50 MG tablet Take 1-2 tablets (50-100 mg total) by mouth every 6 (six) hours as needed for moderate pain. 50 tablet 0  . traZODone (DESYREL) 50 MG tablet 50 mg at bedtime as needed for sleep.     Marland Kitchen zolpidem (AMBIEN) 10 MG tablet Take 10 mg by mouth at bedtime as needed for sleep.      No current facility-administered medications for this visit.    Physical Exam: BP 118/79 mmHg  Pulse 61  Resp 16  Ht '5\' 9"'$  (1.753 m)  Wt 172 lb 9.6 oz (78.291 kg)  BMI 25.48 kg/m2  SpO2 99% He looks well. Lung exam is clear. Cardiac exam shows a regular rate and rhythm with normal  heart sounds. Chest incision is healing well and sternum is stable. The leg incisions are healing well and there is no peripheral edema.   Diagnostic Tests:  CLINICAL DATA: CABG.  EXAM: CHEST 2 VIEW  COMPARISON: 12/01/2015. 11/28/2015. 03/07/2015 05/13/2014. 11/18/2012.  FINDINGS: Mediastinum and hilar structures normal. Prior CABG. Heart size normal. Stable right base pleural parenchymal thickening again noted consistent with scarring. No pleural effusion or pneumothorax.  IMPRESSION: Prior CABG. Borderline cardiomegaly normal pulmonary vascularity. Right base pleural parenchymal scarring.   Electronically Signed  By: Novice  On: 12/31/2015 12:26       *[facility]*  [address], [place] [address]  [phone]  ------------------------------------------------------------------- Transthoracic Echocardiography  Patient: Mycal, Conde MR #: [id number] Study Date: 12/30/2015 Gender: M Age: 69 Height: 175.3 cm Weight: 76.8 kg BSA: 1.94 m^2 Pt. Status: Room:  SONOGRAPHER [place], Will ATTENDING Kathlen Mody, Scott T Faylene Million, Scott T REFERRING Kathlen Mody, Scott T PERFORMING [facility], Outpatient  cc:  ------------------------------------------------------------------- LV EF: 45% - 50%  ------------------------------------------------------------------- Indications: (I25.5). Limited study.  ------------------------------------------------------------------- History: PMH: S/p CABG 11/28/15. Acquired from the patient and from the patient&'s chart. Coronary artery disease. Congestive heart failure. Risk factors: Current tobacco use.  ------------------------------------------------------------------- Study Conclusions  - HPI and indications: Limited study. - Left  ventricle: The cavity size  was normal. Wall thickness was  normal. Systolic function was mildly reduced. The estimated  ejection fraction was in the range of 45% to 50%. Mild anterior  hypokinesis and incoordinate septal motion. The study is not  technically sufficient to allow evaluation of LV diastolic  function.  Impressions:  - Limited study. Compared to the most recent echo, the LVEF has  improved to 45-50% with residual anterior hypokinesis and  incoordinate septal motion.  Transthoracic echocardiography. M-mode, limited 2D, limited spectral Doppler, and color Doppler. Birthdate: Patient birthdate: 12-11-1955. Age: Patient is 60 yr old. Sex: Gender: male. BMI: 25 kg/m^2. Blood pressure: 102/60 Patient status: Outpatient. Study date: Study date: 12/30/2015. Study time: 01:11 PM. Location: [facility]  -------------------------------------------------------------------  ------------------------------------------------------------------- Left ventricle: The cavity size was normal. Wall thickness was normal. Systolic function was mildly reduced. The estimated ejection fraction was in the range of 45% to 50%. Mild anterior hypokinesis and incoordinate septal motion. The study is not technically sufficient to allow evaluation of LV diastolic function.  ------------------------------------------------------------------- Aorta: Aortic root: The aortic root was normal in size. Ascending aorta: The ascending aorta was normal in size.  ------------------------------------------------------------------- Mitral valve: Mildly thickened leaflets . Doppler: There was trivial regurgitation.  ------------------------------------------------------------------- Measurements  Left ventricle Value Reference LV ID, ED, PLAX chordal 44.3 mm 43 - 52 LV ID, ES, PLAX  chordal 33.6 mm 23 - 38 LV fx shortening, PLAX chordal (L) 24 % >=29 LV PW thickness, ED 8.74 mm --------- IVS/LV PW ratio, ED 1.04 <=8.8 LV end-diastolic volume, 1-p K8M 86 ml --------- LV end-systolic volume, 1-p K3K 43 ml --------- LV ejection fraction, 1-p A4C 49 % --------- LV end-diastolic volume/bsa, 1-p 44 ml/m^2 --------- J1P LV end-systolic volume/bsa, 1-p 22 ml/m^2 --------- H1T LV end-diastolic volume, 2-p 89 ml --------- LV end-systolic volume, 2-p 45 ml --------- LV ejection fraction, 2-p 49 % --------- Stroke volume, 2-p 44 ml --------- LV end-diastolic volume/bsa, 2-p 46 ml/m^2 --------- LV end-systolic volume/bsa, 2-p 23 ml/m^2 --------- Stroke volume/bsa, 2-p 22.6 ml/m^2 ---------  Ventricular septum Value Reference IVS thickness, ED 9.1 mm ---------  Aorta Value Reference Aortic root ID, ED 33 mm ---------  Left atrium Value Reference LA ID, A-P, ES 33 mm --------- LA ID/bsa, A-P 1.7 cm/m^2 <=2.2  Legend: (L) and (H) mark values outside specified reference range.  ------------------------------------------------------------------- Prepared and Electronically Authenticated by  Lyman Bishop MD 2017-05-02T17:15:15  Impression:  Overall I think he is doing well. His EF has significantly increased to 45-50%  I encouraged him to continue walking.  He is planning to participate in cardiac rehab. I told him he could drive his car but should not lift anything heavier than 10 lbs for three months postop. I encouraged him to stay away from smoking.   Plan:  He will continue to follow up with Dr. Irish Lack and I will see him back if the need arises.   Gaye Pollack, MD [facility] [phone]

## 2015-12-31 NOTE — Telephone Encounter (Signed)
Pt has been notified of limited echo results and that EF has improved to 45-50 %. Pt reassured that this is good news. Pt aware no need for Life Vest at this time. Pt said thank you and asked for report to be mailed to him. I verified his address.

## 2016-01-05 ENCOUNTER — Telehealth: Payer: Self-pay | Admitting: Physician Assistant

## 2016-01-05 DIAGNOSIS — E785 Hyperlipidemia, unspecified: Secondary | ICD-10-CM

## 2016-01-05 MED ORDER — SIMVASTATIN 40 MG PO TABS: 40.0000 mg | ORAL_TABLET | Freq: Every day | ORAL | Status: DC

## 2016-01-05 NOTE — Telephone Encounter (Signed)
Please tell patient I reviewed with Dr. Casandra Doffing.  The patient needs to increase Simvastatin to 40 mg QHS Check Lipids and LFTs in 6 weeks.   New Rx and Labs entered into Epic. Rx sent to [facility] on [address].  FU as planned. [name], PA-C   01/05/2016 5:31 PM

## 2016-01-06 NOTE — Telephone Encounter (Signed)
I s/w pt today in regards to Dr. Irish Lack and Brynda Rim. PA tio have pt increase Simvastatin to 40 mg daily, LFT/FLP 6/15 when he sees Dr. Irish Lack 6/15. Rx sent. pt agreeable to plan of care. Pt c/o coughing in the middle of the night and coughing up thick, white mucous, with this happening again when he wakes up. Pt admits he tried smoking again and stopped and he is wondering if this is his lungs trying to clear out the cigarettes. I asked pt if he has been having any chest pain, sob or fevers. Pt denies fevers though he states a little sob with walking but this goes away very quickly even while he is still walking, he does not need to rest. He states some chest discomfort though he feels that is coming from the bones healing. He stated he just had a CXR 5/3 and it looked fine. I asked pt if he had allergies and answered yes. I stated to pt that I will d/w Brynda Rim. PA for further advice and cb later today. Pt thanked me for my time and understands plan of care at this time.

## 2016-01-06 NOTE — Telephone Encounter (Signed)
CXR from 5/3 ok. DC cigs. FU with PCP this week. [name], PA-C   01/06/2016 5:13 PM

## 2016-01-06 NOTE — Telephone Encounter (Signed)
Pt aware of recommendation per Brynda Rim. PA to f/u w/PCP in regards to productive cough w/white, thick mucous. Pt agreeable to plan of care.

## 2016-01-12 ENCOUNTER — Telehealth: Payer: Self-pay | Admitting: Interventional Cardiology

## 2016-01-12 NOTE — Telephone Encounter (Signed)
**Note De-Identified  Obfuscation** Per the pt "I have been getting dizzy for about 5 seconds when I sit up after Ive been laying down".  He also c/o upset stomach, fatigue and weakness. He denies any syncopal events and he does not know what his BP is as he does not have a cuff at home.  He reports that his HR has been staying between 76 to 80 bpm  He is advised that I will discuss with [name], PA-c and call him baxck with his recommendations. He verbalized understanding.

## 2016-01-12 NOTE — Telephone Encounter (Signed)
**Note De-Identified  Obfuscation** Per [name], PA-c the pt is advised to hold his Lisinopril X 2 days, increase Protonix 40 mg to BID, F/u with PCP and to increase his fluid intake. The pt verbalized understanding and thanked me for my help.

## 2016-01-12 NOTE — Telephone Encounter (Signed)
NeW Message  Pt c/o dizziness going from sitting to standing- lasting about 5 seconds- pt stated he has not lost conscienceness. Please call back and discuss.

## 2016-01-19 ENCOUNTER — Telehealth: Payer: Self-pay

## 2016-01-19 NOTE — Telephone Encounter (Signed)
PT  CALLED OFFICE  WITH  A  COUPLE OF  QUESTIONS  IS  SCHEDULED TO FOLLOW  UP  WITH   DR  Irish Lack BUT  APP IS  NOT  UNTIL  June.  PT NOTED  THIS  AM BLACK  AND BLUE  DISCOLORATION  TO  LEFT UPPER  THIGH  THE SIZE OF  FIST NOT  SURE  [place]. ALSO NOTES  BRUISING  TO  HARVEST  SIGHT   R  LEG  WHICH  WOULD  BE  EXPECTED . NOTES  SOME BOUTS  OF  DEPRESSION FELT  LIKE THIS OVER  WEEKEND  AND  SLEEP PATTERN IS DIFFERENT  . PT  AWARE WILL FORWARD TO  DR  Irish Lack  FOR REVIEW .Adonis Housekeeper

## 2016-01-19 NOTE — Telephone Encounter (Signed)
Patient has a question about his leg. Advised patient i could not anser his questions and would transfer him to a nurse.   Patient initially called to verify directions on Metoprolol. Voiced understanding of directions.

## 2016-01-20 NOTE — Telephone Encounter (Signed)
Has he been taking anything extra that would thin the blood.  Any NSAIDs?  He can follow with an APP.

## 2016-01-20 NOTE — Telephone Encounter (Signed)
**Note De-Identified  Obfuscation** The pt states that he is taking Asa 81 mg daily and Plavix 75 mg daily. He denies pain in either leg. I offered him a sooner appt but he declined stating that as long as Dr Irish Lack does not feel his bruising is out of the ordinary he would rather wait until 02/12/16 to see Dr Irish Lack.  Will forward message to Dr Irish Lack.

## 2016-01-20 NOTE — Telephone Encounter (Signed)
Watch for any increased bruising or active bleeding from the sites and let us know if this occurs.  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  On Plavix, he will bruise more easily

## 2016-01-21 ENCOUNTER — Other Ambulatory Visit: Payer: Self-pay | Admitting: Surgical

## 2016-01-21 NOTE — Telephone Encounter (Signed)
**Note De-Identified  Obfuscation** The pt is advised and he verbalized understanding.

## 2016-01-27 ENCOUNTER — Other Ambulatory Visit: Payer: Self-pay

## 2016-01-27 ENCOUNTER — Other Ambulatory Visit: Payer: Self-pay | Admitting: Surgical

## 2016-01-27 DIAGNOSIS — I255 Ischemic cardiomyopathy: Secondary | ICD-10-CM

## 2016-01-27 DIAGNOSIS — I2510 Atherosclerotic heart disease of native coronary artery without angina pectoris: Secondary | ICD-10-CM

## 2016-01-27 DIAGNOSIS — E785 Hyperlipidemia, unspecified: Secondary | ICD-10-CM

## 2016-01-27 MED ORDER — METOPROLOL TARTRATE 50 MG PO TABS: 25.0000 mg | ORAL_TABLET | Freq: Two times a day (BID) | ORAL | Status: DC

## 2016-01-27 MED ORDER — SIMVASTATIN 40 MG PO TABS
40.0000 mg | ORAL_TABLET | Freq: Every day | ORAL | Status: DC
Start: 2016-01-27 — End: 2016-02-13

## 2016-01-27 MED ORDER — LISINOPRIL 5 MG PO TABS: 5.0000 mg | ORAL_TABLET | Freq: Every day | ORAL | Status: DC

## 2016-02-03 ENCOUNTER — Telehealth: Payer: Self-pay | Admitting: Interventional Cardiology

## 2016-02-03 ENCOUNTER — Encounter (HOSPITAL_COMMUNITY)
Admission: RE | Admit: 2016-02-03 | Discharge: 2016-02-03 | Disposition: A | Discharge status: Home / Self Care | Payer: BC Managed Care – PPO | Source: Ambulatory Visit | Attending: Interventional Cardiology | Admitting: Interventional Cardiology

## 2016-02-03 VITALS — BP 111/72 | HR 55 | Ht 69.25 in | Wt 172.2 lb

## 2016-02-03 DIAGNOSIS — Z7902 Long term (current) use of antithrombotics/antiplatelets: Secondary | ICD-10-CM | POA: Insufficient documentation

## 2016-02-03 DIAGNOSIS — Z79899 Other long term (current) drug therapy: Secondary | ICD-10-CM | POA: Diagnosis not present

## 2016-02-03 DIAGNOSIS — Z951 Presence of aortocoronary bypass graft: Secondary | ICD-10-CM | POA: Diagnosis not present

## 2016-02-03 DIAGNOSIS — I213 ST elevation (STEMI) myocardial infarction of unspecified site: Secondary | ICD-10-CM | POA: Diagnosis present

## 2016-02-03 DIAGNOSIS — Z7982 Long term (current) use of aspirin: Secondary | ICD-10-CM | POA: Insufficient documentation

## 2016-02-03 NOTE — Progress Notes (Signed)
Cardiac Rehab Medication Review by a Pharmacist  Does the patient  feel that his/her medications are working for him/her?  yes  Has the patient been experiencing any side effects to the medications prescribed?  Yes, patient has had tiredness lately but can not link this to anyone medication.  Does the patient measure his/her own blood pressure or blood glucose at home?  no   Does the patient have any problems obtaining medications due to transportation or finances?   no  Understanding of regimen: excellent Understanding of indications: good Potential of compliance: good  Pharmacist comments: Pt reported tiredness and stated that he would start monitoring BP at home to see if if was low at the times he feels bad.  Darl Pikes, PharmD Clinical Pharmacist- Resident Pager: [phone]  Darl Pikes 02/03/2016 1:47 PM

## 2016-02-03 NOTE — Telephone Encounter (Signed)
Follow up      Pt c/o BP issue: STAT if pt c/o blurred vision, one-sided weakness or slurred speech  1. What are your last 5 BP readings? lying down 118/71, sitting 104/61, standing 84/63 2. Are you having any other symptoms (ex. Dizziness, headache, blurred vision, passed out)? tired 3. What is your BP issue?  Calling to give orthostatic bp reading to the nurse

## 2016-02-03 NOTE — Telephone Encounter (Signed)
Also routed to [place] in cardiac rehab at this time.

## 2016-02-03 NOTE — Telephone Encounter (Signed)
New Message:   Please call,he thinks he might be having a reaction to his medicine(does not know which particular one).

## 2016-02-03 NOTE — Telephone Encounter (Signed)
Patient reports:  for last 4-5 days he slept majority of the time.  He is very tired.  Has not used his trazadone or ambien. Difficulty with urination-esp getting stream started Still exercising but forced effort Upset stomach in AM   HR stays 70-80-checks manually Will have BP checked today when he goes to cardiac rehab eval Did not increase protonix to 40 BID as instructed on 5/15.  He will do this starting today. Drinks about 3 bottles of water daily, not really any other fluids.  Advised to increase to 4-5 bottles per day. When he was instructed to hold lisinopril for 2 days last month he felt better for those days.  Advised will discuss with a provider after I see what his BP is at cardiac rehab today and we will call him back with any new recommendations.

## 2016-02-03 NOTE — Progress Notes (Signed)
Pt arrived at cardiac rehab c/o fatigue more than usual. Pt states these symptoms began a few weeks ago when he started new medication. Pt unsure of name of med however chart review indicates it was lisinopril.   Pt denies dizziness or lightheadedness.  BP:  118/71 HR-50 lying, 104/61 HR--56 sitting, 84/63 HR- 68 standing.  Pt given gatorade, repeat BP:  111/ 56, HR-55.  Pt tolerated 6 minute walk test without difficulty. Post exercise BP:  94/62.  Pt slightly lightheaded.  Pt given water, recheck BP:  102/64.  Pt asymptomatic.  Message received from [place], [place] who advised pt to hold lisinopril '5mg'$  daily and increase PO fluid intake. Pt verbalized understanding.

## 2016-02-03 NOTE — Telephone Encounter (Signed)
Reviewed with [name], PA-C. He recommends increasing fluids and stop lisinopril for now Have orthostatics checked again the next time he is at cardiac rehab.  Also rec contact PCP regarding urination concerns.  Left message for patient to call back

## 2016-02-04 NOTE — Telephone Encounter (Signed)
Lowell Guitar, RN at 02/03/2016 3:09 PM     Status: Signed       Expand All Collapse All   Pt arrived at cardiac rehab c/o fatigue more than usual. Pt states these symptoms began a few weeks ago when he started new medication. Pt unsure of name of med however chart review indicates it was lisinopril. Pt denies dizziness or lightheadedness. BP: 118/71 HR-50 lying, 104/61 HR--56 sitting, 84/63 HR- 68 standing. Pt given gatorade, repeat BP: 111/ 56, HR-55. Pt tolerated 6 minute walk test without difficulty. Post exercise BP: 94/62. Pt slightly lightheaded. Pt given water, recheck BP: 102/64. Pt asymptomatic. Message received from [place], [place] who advised pt to hold lisinopril '5mg'$  daily and increase PO fluid intake. Pt verbalized understanding.

## 2016-02-05 ENCOUNTER — Other Ambulatory Visit: Payer: BC Managed Care – PPO

## 2016-02-05 ENCOUNTER — Other Ambulatory Visit: Payer: Self-pay

## 2016-02-05 ENCOUNTER — Encounter (HOSPITAL_COMMUNITY): Payer: Self-pay

## 2016-02-05 MED ORDER — LISINOPRIL 5 MG PO TABS: 5.0000 mg | ORAL_TABLET | Freq: Every day | ORAL | Status: DC

## 2016-02-05 NOTE — Progress Notes (Signed)
Cardiac Individual Treatment Plan  Patient Details  Name: Hillel Card MRN: [id number] Date of Birth: 1955/12/19 Referring Provider:        CARDIAC REHAB PHASE II ORIENTATION from 02/03/2016 in [facility]   Referring Provider  Larae Grooms MD      Initial Encounter Date:       CARDIAC REHAB PHASE II ORIENTATION from 02/03/2016 in [place]   Date  02/03/16   Referring Provider  Larae Grooms MD      Visit Diagnosis: ST elevation (STEMI) myocardial infarction of unspecified site ([place])  S/P CABG x 5  Patient's Home Medications on Admission:  Current outpatient prescriptions:  .  aspirin 81 MG EC tablet, Take 1 tablet (81 mg total) by mouth daily., Disp: , Rfl:  .  chlordiazePOXIDE (LIBRIUM) 25 MG capsule, Take 25 mg by mouth daily as needed for anxiety. , Disp: , Rfl:  .  clopidogrel (PLAVIX) 75 MG tablet, Take 1 tablet (75 mg total) by mouth daily., Disp: 30 tablet, Rfl: 11 .  lisinopril (PRINIVIL,ZESTRIL) 5 MG tablet, Take 1 tablet (5 mg total) by mouth daily., Disp: 30 tablet, Rfl: 1 .  metoprolol (LOPRESSOR) 50 MG tablet, Take 0.5 tablets (25 mg total) by mouth 2 (two) times daily., Disp: 60 tablet, Rfl: 1 .  Multiple Vitamins-Minerals (ONE-A-DAY MENS 50+ ADVANTAGE PO), Take 1 tablet by mouth daily. , Disp: , Rfl:  .  pantoprazole (PROTONIX) 40 MG tablet, Take 1 tablet (40 mg total) by mouth daily before breakfast., Disp: 30 tablet, Rfl: 0 .  simvastatin (ZOCOR) 40 MG tablet, Take 1 tablet (40 mg total) by mouth daily at 6 PM., Disp: 30 tablet, Rfl: 1 .  traZODone (DESYREL) 50 MG tablet, 50 mg at bedtime as needed for sleep. , Disp: , Rfl:  .  zolpidem (AMBIEN) 10 MG tablet, Take 10 mg by mouth at bedtime as needed for sleep. , Disp: , Rfl:   Past Medical History: Past Medical History  Diagnosis Date  . Anxiety   . Esophageal reflux     eosinophil esophagitis  . Acute hepatitis C without mention of  hepatic coma     Tx by [facility] for Hep.  negative for hep c 2/15.  . Colitis   . CAD (coronary artery disease)     a. ant STEMI >> LHC with 3 v CAD; oLAD tx with POBA >> emergent CABG  . Ischemic cardiomyopathy     a. EF 25-30% at intraop TEE 4/17  //  b. Limited Echo 5/17 - EF 45-50%, mild ant HK  . Chronic systolic CHF (congestive heart failure) (HCC)     Tobacco Use: History  Smoking status  . Former Smoker -- 0.50 packs/day  . Types: Cigarettes  . Quit date: 11/28/2015  Smokeless tobacco  . Never Used    Labs:     Recent Review Flowsheet Data    Labs for ITP Cardiac and Pulmonary Rehab Latest Ref Rng 11/28/2015 11/29/2015 11/29/2015 11/29/2015 11/29/2015   PHART 7.350 - 7.450 7.373 7.350 - 7.415 -   PCO2ART 35.0 - 45.0 mmHg 48.7(H) 45.3(H) - 43.1 -   HCO3 20.0 - 24.0 mEq/L 28.3(H) 25.1(H) - 27.7(H) -   TCO2 0 - 100 mmol/L '30 26 24 29 23   '$ ACIDBASEDEF 0.0 - 2.0 mmol/L - 1.0 - - -   O2SAT - 100.0 98.0 - 98.0 -      Capillary Blood Glucose: Lab Results  Component Value  Date   GLUCAP 104* 11/30/2015   GLUCAP 106* 11/30/2015   GLUCAP 93 11/29/2015   GLUCAP 129* 11/29/2015   GLUCAP 98 11/29/2015     Exercise Target Goals: Date: 02/03/16  Exercise Program Goal: Individual exercise prescription set with THRR, safety & activity barriers. Participant demonstrates ability to understand and report RPE using BORG scale, to self-measure pulse accurately, and to acknowledge the importance of the exercise prescription.  Exercise Prescription Goal: Starting with aerobic activity 30 plus minutes a day, 3 days per week for initial exercise prescription. Provide home exercise prescription and guidelines that participant acknowledges understanding prior to discharge.  Activity Barriers & Risk Stratification:     Activity Barriers & Cardiac Risk Stratification - 02/03/16 1418    Activity Barriers & Cardiac Risk Stratification   Activity Barriers Other (comment)   Comments Rare gout.  Occassionaly fluid on rt knee.   Cardiac Risk Stratification High      6 Minute Walk:     6 Minute Walk      02/03/16 1629       6 Minute Walk   Phase Initial     Distance 1589 feet     Walk Time 6 minutes     # of Rest Breaks 0     MPH 3.01     METS 3.89     RPE 11     VO2 Peak 13.61     Symptoms Yes (comment)     Comments Lightheaded     Resting HR 55 bpm     Resting BP 111/72 mmHg     Max Ex. HR 105 bpm     Max Ex. BP 94/60 mmHg     2 Minute Post BP 102/64 mmHg        Initial Exercise Prescription:     Initial Exercise Prescription - 02/03/16 1600    Date of Initial Exercise RX and Referring Provider   Date 02/03/16   Referring Provider Larae Grooms MD   Treadmill   MPH 2   Grade 1   Minutes 10   METs 2.81   Bike   Level 1   Minutes 10   METs 3.44   NuStep   Level 2   Minutes 10   METs 2.5   Prescription Details   Frequency (times per week) 3   Duration Progress to 30 minutes of continuous aerobic without signs/symptoms of physical distress   Intensity   THRR 40-80% of Max Heartrate 64-128 bpm   Ratings of Perceived Exertion 11-13   Perceived Dyspnea 0-4   Progression   Progression Continue to progress workloads to maintain intensity without signs/symptoms of physical distress.   Resistance Training   Training Prescription Yes   Weight 2 lbs   Reps 10-12      Perform Capillary Blood Glucose checks as needed.  Exercise Prescription Changes:   Exercise Comments:   Discharge Exercise Prescription (Final Exercise Prescription Changes):   Nutrition:  Target Goals: Understanding of nutrition guidelines, daily intake of sodium '1500mg'$ , cholesterol '200mg'$ , calories 30% from fat and 7% or less from saturated fats, daily to have 5 or more servings of fruits and vegetables.  Biometrics:     Pre Biometrics - 02/03/16 1627    Pre Biometrics   Height 5' 9.25" (1.759 m)   Weight 172 lb 2.9 oz (78.1 kg)   Waist Circumference 34.25  inches   Hip Circumference 39.25 inches   Waist to Hip Ratio 0.87 %   BMI (  Calculated) 25.3   Triceps Skinfold 15 mm   % Body Fat 23.5 %   Grip Strength 37.5 kg   Flexibility 15 in   Single Leg Stand 25 seconds       Nutrition Therapy Plan and Nutrition Goals:     Nutrition Therapy & Goals - 02/05/16 0957    Nutrition Therapy   Diet Therapeutic Lifestyle Changes   Personal Nutrition Goals   Personal Goal #1 Maintain wt around 172 lb while in [place], educate and counsel regarding individualized specific dietary modifications aiming towards targeted core components such as weight, hypertension, lipid management, diabetes, heart failure and other comorbidities.   Expected Outcomes Short Term Goal: Understand basic principles of dietary content, such as calories, fat, sodium, cholesterol and nutrients.;Long Term Goal: Adherence to prescribed nutrition plan.      Nutrition Discharge: Nutrition Scores:     Nutrition Assessments - 02/05/16 0959    MEDFICTS Scores   Pre Score 36      Nutrition Goals Re-Evaluation:   Psychosocial: Target Goals: Acknowledge presence or absence of depression, maximize coping skills, provide positive support system. Participant is able to verbalize types and ability to use techniques and skills needed for reducing stress and depression.  Initial Review & Psychosocial Screening:     Initial Psych Review & Screening - 02/05/16 Petrey? Yes   Barriers   Psychosocial barriers to participate in program There are no identifiable barriers or psychosocial needs.   Screening Interventions   Interventions Encouraged to exercise      Quality of Life Scores:     Quality of Life - 02/03/16 1628    Quality of Life Scores   Health/Function Pre 16.93 %   Socioeconomic Pre 21.57 %   Psych/Spiritual Pre 22.36 %   Family Pre 28.8 %   GLOBAL Pre 20.75 %       PHQ-9:     Recent Review Flowsheet Data    There is no flowsheet data to display.      Psychosocial Evaluation and Intervention:   Psychosocial Re-Evaluation:   Vocational Rehabilitation: Provide vocational rehab assistance to qualifying candidates.   Vocational Rehab Evaluation & Intervention:     Vocational Rehab - 02/05/16 1013    Initial Vocational Rehab Evaluation & Intervention   Assessment shows need for Vocational Rehabilitation No      Education: Education Goals: Education classes will be provided on a weekly basis, covering required topics. Participant will state understanding/return demonstration of topics presented.  Learning Barriers/Preferences:     Learning Barriers/Preferences - 02/05/16 1007    Learning Barriers/Preferences   Learning Barriers --  wears glasses   Learning Preferences Written Material;Audio      Education Topics: Count Your Pulse:  -Group instruction provided by verbal instruction, demonstration, patient participation and written materials to support subject.  Instructors address importance of being able to find your pulse and how to count your pulse when at home without a heart monitor.  Patients get hands on experience counting their pulse with staff help and individually.   Heart Attack, Angina, and Risk Factor Modification:  -Group instruction provided by verbal instruction, video, and written materials to support subject.  Instructors address signs and symptoms of angina and heart attacks.    Also discuss risk factors for heart disease and how to make changes to improve heart health risk factors.   Functional Fitness:  -  Group instruction provided by verbal instruction, demonstration, patient participation, and written materials to support subject.  Instructors address safety measures for doing things around the house.  Discuss how to get up and down off the floor, how to pick things up properly, how to safely get out of a  chair without assistance, and balance training.   Meditation and Mindfulness:  -Group instruction provided by verbal instruction, patient participation, and written materials to support subject.  Instructor addresses importance of mindfulness and meditation practice to help reduce stress and improve awareness.  Instructor also leads participants through a meditation exercise.    Stretching for Flexibility and Mobility:  -Group instruction provided by verbal instruction, patient participation, and written materials to support subject.  Instructors lead participants through series of stretches that are designed to increase flexibility thus improving mobility.  These stretches are additional exercise for major muscle groups that are typically performed during regular warm up and cool down.   Hands Only CPR Anytime:  -Group instruction provided by verbal instruction, video, patient participation and written materials to support subject.  Instructors co-teach with AHA video for hands only CPR.  Participants get hands on experience with mannequins.   Nutrition I class: Heart Healthy Eating:  -Group instruction provided by PowerPoint slides, verbal discussion, and written materials to support subject matter. The instructor gives an explanation and review of the Therapeutic Lifestyle Changes diet recommendations, which includes a discussion on lipid goals, dietary fat, sodium, fiber, plant stanol/sterol esters, sugar, and the components of a well-balanced, healthy diet.   Nutrition II class: Lifestyle Skills:  -Group instruction provided by PowerPoint slides, verbal discussion, and written materials to support subject matter. The instructor gives an explanation and review of label reading, grocery shopping for heart health, heart healthy recipe modifications, and ways to make healthier choices when eating out.   Diabetes Question & Answer:  -Group instruction provided by PowerPoint slides, verbal  discussion, and written materials to support subject matter. The instructor gives an explanation and review of diabetes co-morbidities, pre- and post-prandial blood glucose goals, pre-exercise blood glucose goals, signs, symptoms, and treatment of hypoglycemia and hyperglycemia, and foot care basics.   Diabetes Blitz:  -Group instruction provided by PowerPoint slides, verbal discussion, and written materials to support subject matter. The instructor gives an explanation and review of the physiology behind type 1 and type 2 diabetes, diabetes medications and rational behind using different medications, pre- and post-prandial blood glucose recommendations and Hemoglobin A1c goals, diabetes diet, and exercise including blood glucose guidelines for exercising safely.    Portion Distortion:  -Group instruction provided by PowerPoint slides, verbal discussion, written materials, and food models to support subject matter. The instructor gives an explanation of serving size versus portion size, changes in portions sizes over the last 20 years, and what consists of a serving from each food group.   Stress Management:  -Group instruction provided by verbal instruction, video, and written materials to support subject matter.  Instructors review role of stress in heart disease and how to cope with stress positively.     Exercising on Your Own:  -Group instruction provided by verbal instruction, power point, and written materials to support subject.  Instructors discuss benefits of exercise, components of exercise, frequency and intensity of exercise, and end points for exercise.  Also discuss use of nitroglycerin and activating EMS.  Review options of places to exercise outside of rehab.  Review guidelines for sex with heart disease.   Cardiac Drugs I:  -Group  instruction provided by verbal instruction and written materials to support subject.  Instructor reviews cardiac drug classes: antiplatelets,  anticoagulants, beta blockers, and statins.  Instructor discusses reasons, side effects, and lifestyle considerations for each drug class.   Cardiac Drugs II:  -Group instruction provided by verbal instruction and written materials to support subject.  Instructor reviews cardiac drug classes: angiotensin converting enzyme inhibitors (ACE-I), angiotensin II receptor blockers (ARBs), nitrates, and calcium channel blockers.  Instructor discusses reasons, side effects, and lifestyle considerations for each drug class.   Anatomy and Physiology of the Circulatory System:  -Group instruction provided by verbal instruction, video, and written materials to support subject.  Reviews functional anatomy of heart, how it relates to various diagnoses, and what role the heart plays in the overall system.   Knowledge Questionnaire Score:     Knowledge Questionnaire Score - 02/03/16 1629    Knowledge Questionnaire Score   Pre Score 20/24      Core Components/Risk Factors/Patient Goals at Admission:     Personal Goals and Risk Factors at Admission - 02/03/16 1648    Core Components/Risk Factors/Patient Goals on Admission   Tobacco Cessation Yes   Number of packs per day <1   Intervention Assist the participant in steps to quit. Provide individualized education and counseling about committing to Tobacco Cessation, relapse prevention, and pharmacological support that can be provided by physician.   Expected Outcomes Short Term: Will quit all tobacco product use, adhering to prevention of relapse plan.;Long Term: Complete abstinence from all tobacco products for at least 12 months from quit date.  Patient quit 11/28/15 and is currently abstaining.   Stress Yes   Intervention Offer individual and/or small group education and counseling on adjustment to heart disease, stress management and health-related lifestyle change. Teach and support self-help strategies.;Refer participants experiencing significant  psychosocial distress to appropriate mental health specialists for further evaluation and treatment. When possible, include family members and significant others in education/counseling sessions.   Expected Outcomes Short Term: Participant demonstrates changes in health-related behavior, relaxation and other stress management skills, ability to obtain effective social support, and compliance with psychotropic medications if prescribed.;Long Term: Emotional wellbeing is indicated by absence of clinically significant psychosocial distress or social isolation.   Personal Goal Other Yes   Personal Goal Regain energy. Start regular exercise prorgam. Participate in mission trips and help with church activities.   Intervention Provide individualized exercise program to improve cardiorespiratory fitness.   Expected Outcomes Energy and stamina imrpvovement as fitness level increases. Able to participate in more activites without feeling tired.      Core Components/Risk Factors/Patient Goals Review:    Core Components/Risk Factors/Patient Goals at Discharge (Final Review):    ITP Comments:     ITP Comments      02/03/16 1444           ITP Comments Dr. Fransico Him, Medical Director           Comments: Patient attended orientation from 1330 to 1530 to review rules and guidelines for program. Completed 6 minute walk test, Intitial ITP, and exercise prescription.  VSS. Telemetry-Sinus rhythm with arrythmia .Mr Marineau complained of feeling lightheaded at the end of the walk test. Orthostatic blood pressures checked. Patient was given Gatorade. Dr Hassell Done office called and notified. Mr. Vernet lisinopril was discontinued.Patient reported feeling better after drinking the Gatorade. Exit blood pressure 102/64. Heart rate 53. Will continue to monitor the patient throughout  the program. No complaints or symptoms upon exit from  cardiac rehab.

## 2016-02-08 IMAGING — US US ABDOMEN COMPLETE
1 series · 13 of 25 positions shown · non-contrast
Comparison: Ultrasound of the abdomen of 11/20/2012

CLINICAL DATA: Intermittent abdominal pain, history of hepatitis-C,
hepatoma surveillance

EXAM:
ULTRASOUND ABDOMEN COMPLETE

[Series 1: us abdomen complete · 0.23mm/px · 13 of 80 slices shown]
[im 1/80]
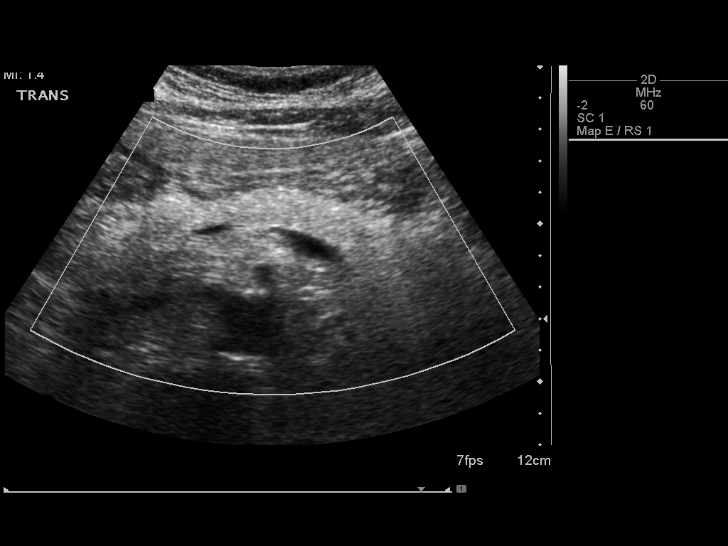
[im 7/80]
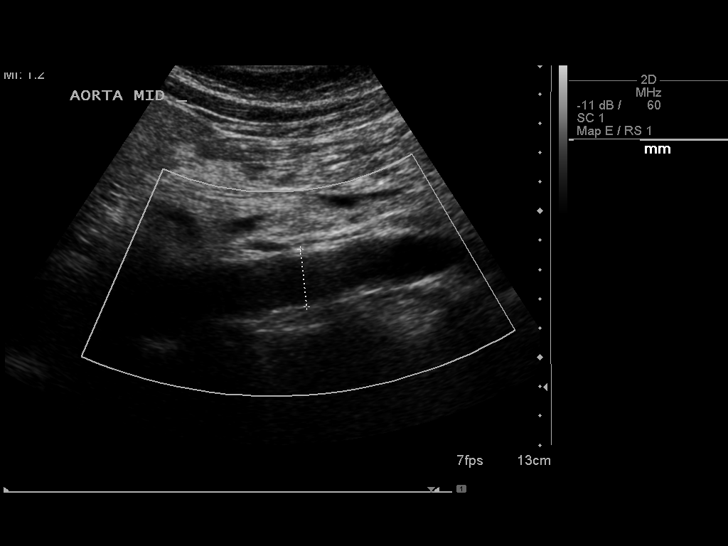
[im 14/80]
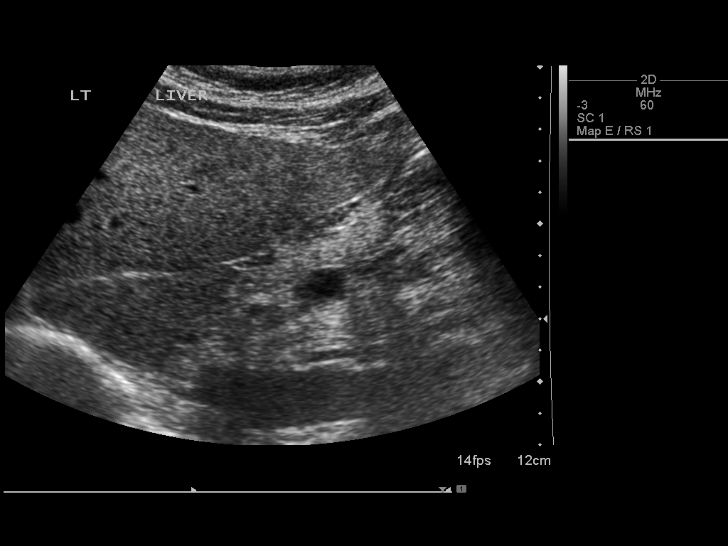
[im 20/80]
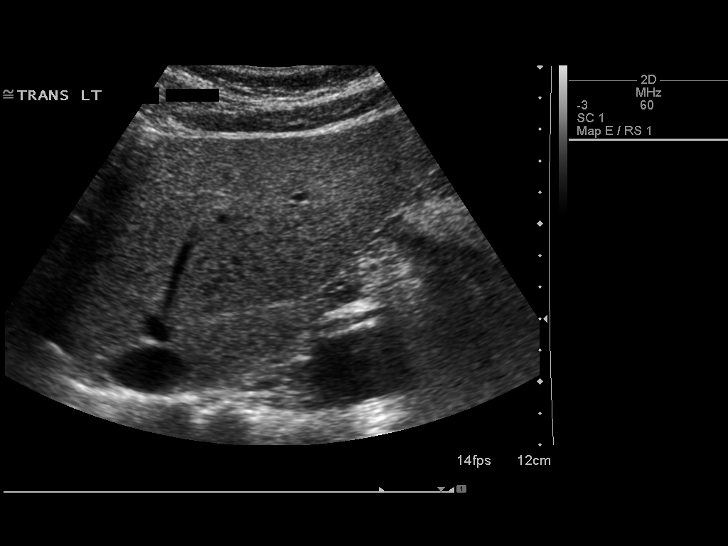
[im 27/80]
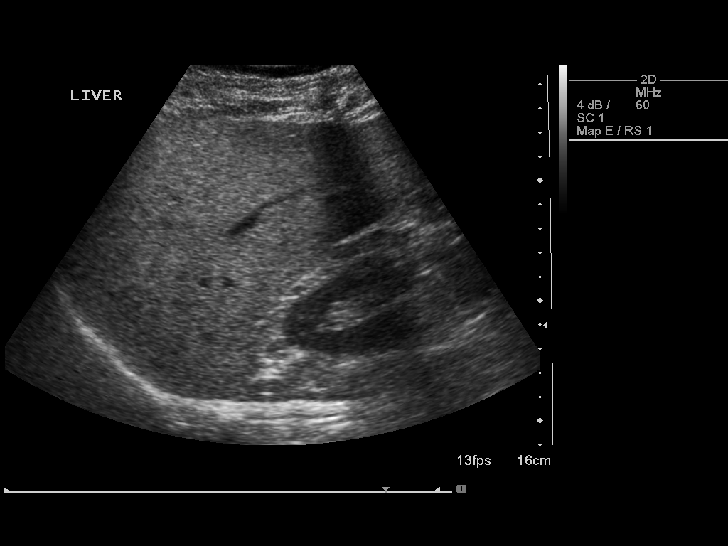
[im 33/80]
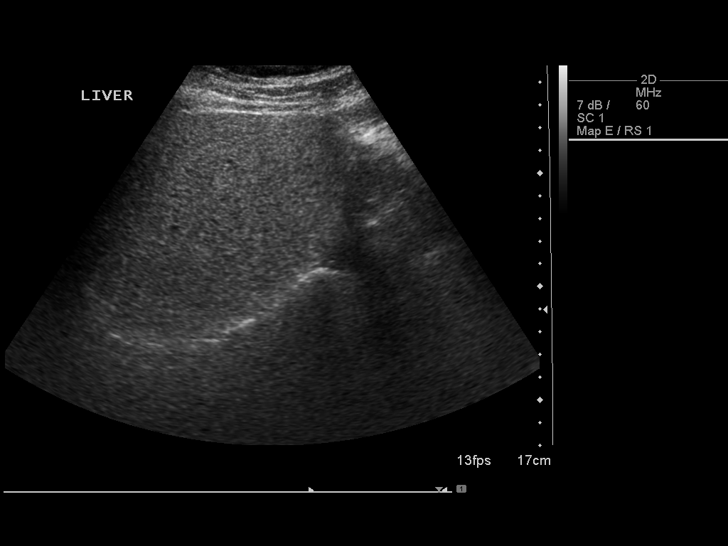
[im 40/80]
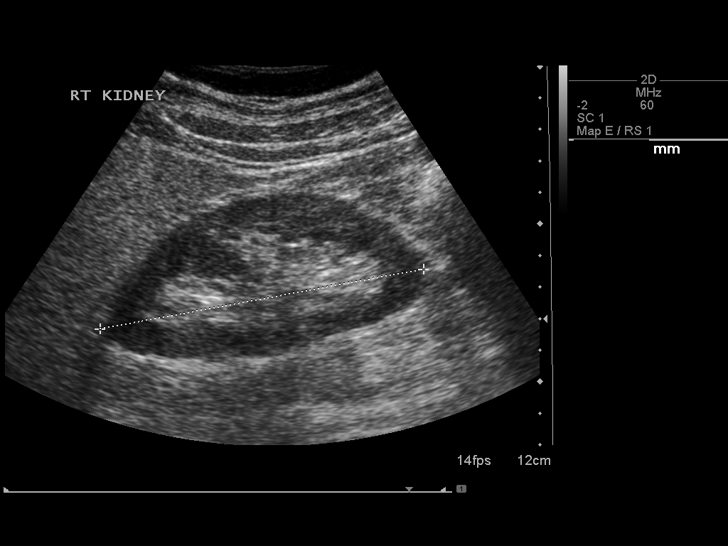
[im 47/80]
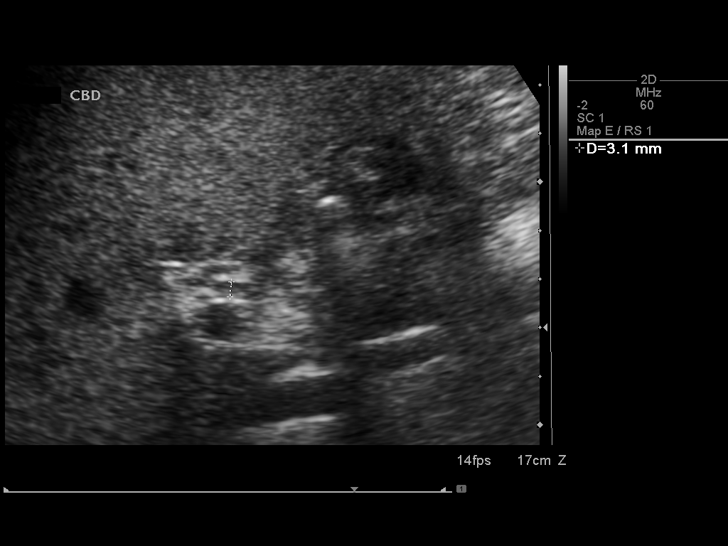
[im 53/80]
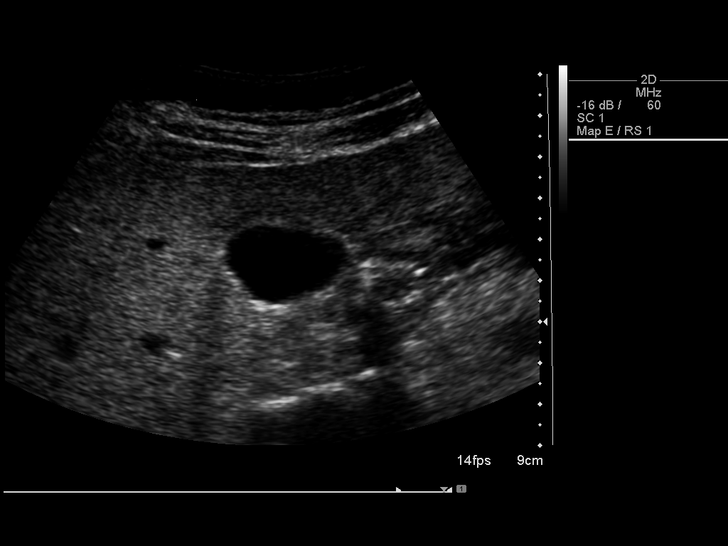
[im 60/80]
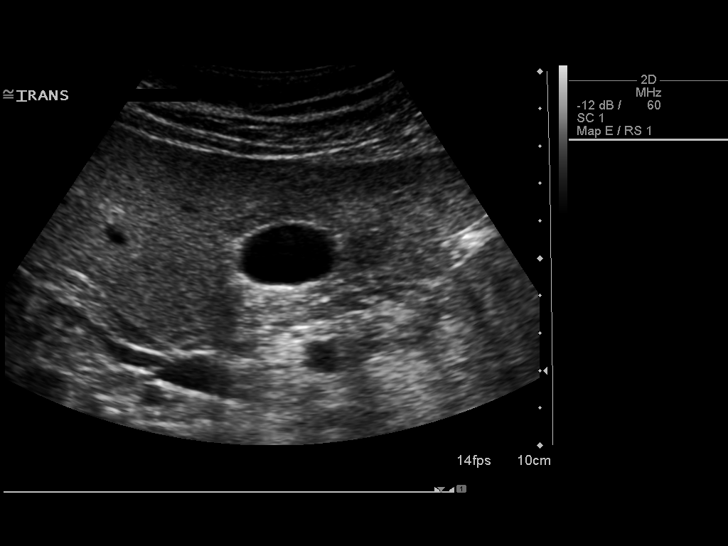
[im 66/80]
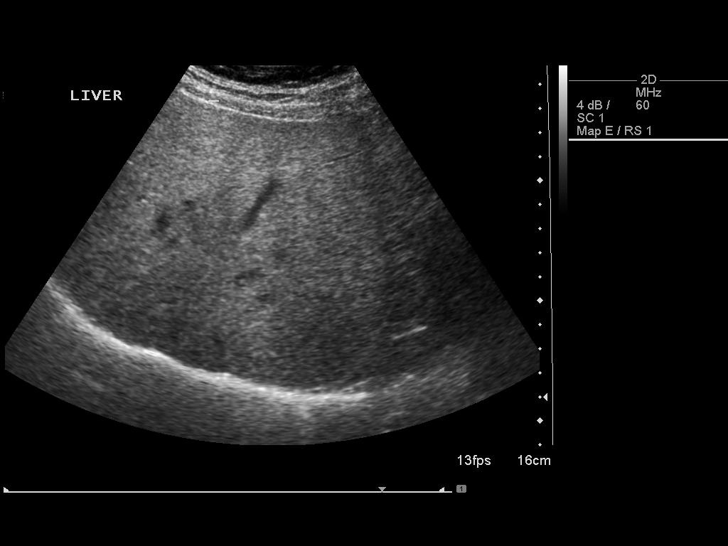
[im 73/80]
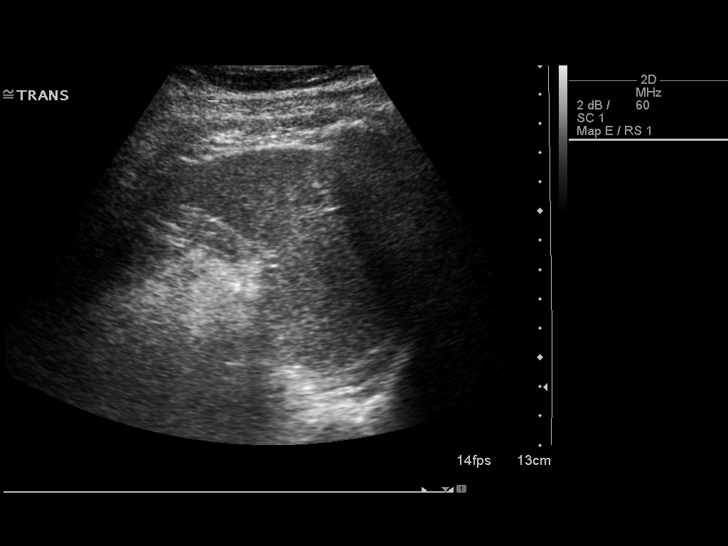
[im 80/80]
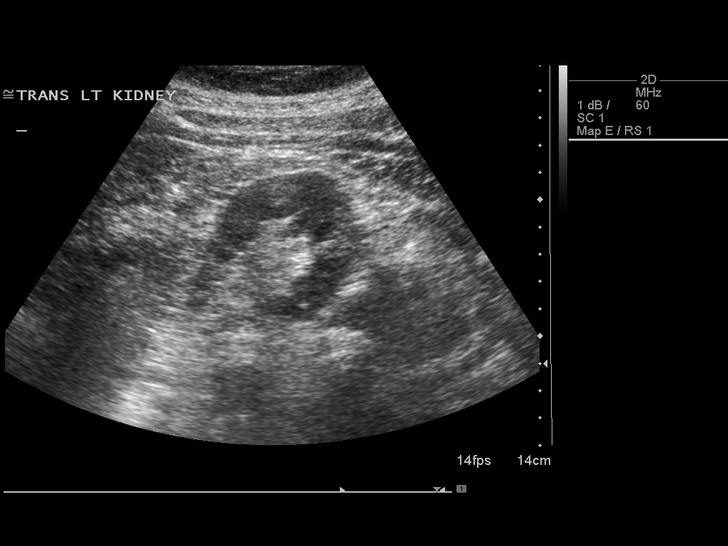

[13 of 25 positions shown; findings below may reference images not displayed]

FINDINGS: Gallbladder:

The gallbladder is visualized and no gallstones are noted. There is
no pain over the gallbladder with compression.

Common bile duct:

Diameter: The common bile duct is normal measuring 3.1 mm in
diameter.

Liver:

The liver is diffusely inhomogeneous and echogenic consistent with
fatty infiltration. There is a small area of diminished echogenicity
near the gallbladder which may represent focal fatty sparing, and
this does not appear significantly changed compared to the prior CT.
If concerned clinically in view of the patient's history, MRI could
be helpful to assess further.

IVC:

No abnormality visualized.

Pancreas:

The head and body the pancreas is unremarkable. The tail of the
pancreas is partially obscured by bowel gas.

Spleen:

The spleen is normal measuring 6.2 cm sagittally.

Right Kidney:

Length: 10.4 cm..  No hydronephrosis is seen.

Left Kidney:

Length: 12.3 cm..  No hydronephrosis is noted.

Abdominal aorta:

The abdominal aorta is normal caliber.

Other findings:

None.
IMPRESSION: 1. Diffusely inhomogeneous and echogenic liver parenchyma consistent
with fatty infiltration with probable sparing near the gallbladder
as noted above. If clinically suspicious in view of the patient's
history, MRI could be performed to assess more sensitively.
2. No gallstones.
3. The tail of the pancreas is obscured by bowel gas.

## 2016-02-09 ENCOUNTER — Encounter (HOSPITAL_COMMUNITY)
Admission: RE | Admit: 2016-02-09 | Discharge: 2016-02-09 | Disposition: A | Discharge status: Home / Self Care | Payer: BC Managed Care – PPO | Source: Ambulatory Visit | Attending: Interventional Cardiology | Admitting: Interventional Cardiology

## 2016-02-09 DIAGNOSIS — I213 ST elevation (STEMI) myocardial infarction of unspecified site: Secondary | ICD-10-CM

## 2016-02-09 DIAGNOSIS — Z951 Presence of aortocoronary bypass graft: Secondary | ICD-10-CM

## 2016-02-09 NOTE — Progress Notes (Signed)
Daily Session Note  Patient Details  Name: [name] MRN: [id number] Date of Birth: [birth year] Referring Provider:            CARDIAC REHAB PHASE II ORIENTATION from 02/03/2016 in [place]   Referring Provider  Larae Grooms MD      Encounter Date: 02/09/2016  Check In:     Session Check In - 02/09/16 1553    Check-In   Location [facility]   Staff Present Maurice Small, RN, BSN;Amber Fair, MS, ACSM RCEP, Exercise Physiologist;Joann Rion, RN, Deland Pretty, MS, ACSM CEP, Exercise Physiologist   Supervising physician immediately available to respond to emergencies See telemetry face sheet for immediately available MD   Physician(s) Dr. Marily Memos   Medication changes reported     No   Fall or balance concerns reported    No   Warm-up and Cool-down Performed as group-led instruction   Resistance Training Performed Yes   VAD Patient? No   Pain Assessment   Currently in Pain? No/denies      Capillary Blood Glucose: No results found for this or any previous visit (from the past 24 hour(s)).   Goals Met:  Personal goals reviewed  Goals Unmet:  Not Applicable  Comments:  Pt started full day of exercise in the Phase II  cardiac rehab program today.  Pt tolerated light exercise without difficulty but did admit this was more activity than he was doing at home.  Pt fatigued easily.  Pt remarked that he ate breakfast around 6:6:30 which consisted of activia yogurt and applesauce.  Pt did not eat any lunch and exercised at 3:00.  Attribute fatigue and energy level to low amounts of food.  Pt advised he will need to eat something prior to coming to exercise if it has been more than 2 hours since last meal. . VSS, telemetry-ST/Sr with no noted ectopy.  Pt with elevated HR pre exercise.  HR decreased quickly with rest.  Pt admits he had not drank much water today and only took a sip or two of water with medications.   Instructed pt to increase his fluid intake prior to returning to exercise on Wednesday to avoid dehydration. .  Medication list reconciled.  Pt denies barriers to medicaiton compliance.  PSYCHOSOCIAL ASSESSMENT:  PHQ-6. Pt with prior psychosocial issues and concerns that have have been magnified with recent cardiac event.  Pt takes Librium as needed but reports he takes the Librium on a fairly consistent basis.  Pt is under the care of a psychiatrist and is due for a follow up.Pt has a  supportive family.Pt wife accompanied pt to orientation class.  Continue to monitor pt mental well being with and intervene as needed .    Pt enjoys socializing.  Pt likes to have family and friends over and loves to entertain. Pt short term goal is regain energy and start exercising on a regular basis. Pt readily admits he isnt big on exercise but hopes attending a cardiac rehab program will get him on track  Long term goal is do mission trips and help with church activities. Pt has participated in local mission trips and desires to go into othe areas.   Pt oriented to exercise equipment and routine.    Understanding verbalized. Maurice Small RN, BSN     Dr. Fransico Him is Medical Director for Cardiac Rehab at [facility].

## 2016-02-11 ENCOUNTER — Encounter (HOSPITAL_COMMUNITY): Admission: RE | Admit: 2016-02-11 | Payer: BC Managed Care – PPO | Source: Ambulatory Visit

## 2016-02-11 ENCOUNTER — Telehealth (HOSPITAL_COMMUNITY): Payer: Self-pay | Admitting: Cardiac Rehabilitation

## 2016-02-11 NOTE — Progress Notes (Signed)
Cardiology Office Note   Date:  02/12/2016   ID:  [name], DOB Feb 14, 1956, MRN [id number]  PCP:  Anthony Melter, MD    No chief complaint on file. f/u CAD   Wt Readings from Last 3 Encounters:  02/12/16 173 lb (78.472 kg)  02/03/16 172 lb 2.9 oz (78.1 kg)  12/31/15 172 lb 9.6 oz (78.291 kg)       History of Present Illness: [name] is a 60 y.o. male  with a hx of HCV s/p treatment, tobacco abuse. Admitted 3/31-4/6 with anterior STEMI. Emergent LHC demonstrated severe 3 v CAD with ostial LAD occlusion. LAD was opened with POBA. IABP was placed and he was seen emergently by Dr. Cyndia Bent. He underwent CABG with LIMA-LAD, S-Dx, S-RI/OM, S-PDA.  EF in May 2017 had improved to 45-50%.  He has stopped smoking since the surgery. He still craves cigarettes.   He has recovered well.  He started rehab.  He is walking without problems.  No sx like his MI. He still feels a little fatigued.  He is back to work at about 80%.  He feels that his memory is a decreased.  No NTG usage.  He lost 15 lbs with the surgery. He is trying to eat healthier.     Past Medical History  Diagnosis Date  . Anxiety   . Esophageal reflux     eosinophil esophagitis  . Acute hepatitis C without mention of hepatic coma     Tx by [facility] for Hep.  negative for hep c 2/15.  . Colitis   . CAD (coronary artery disease)     a. ant STEMI >> LHC with 3 v CAD; oLAD tx with POBA >> emergent CABG  . Ischemic cardiomyopathy     a. EF 25-30% at intraop TEE 4/17  //  b. Limited Echo 5/17 - EF 45-50%, mild ant HK  . Chronic systolic CHF (congestive heart failure) [facility])     Past Surgical History  Procedure Laterality Date  . Shoulder surgery    . Cardiac catheterization N/A 11/28/2015    Procedure: Left Heart Cath and Coronary Angiography;  Surgeon: [name], MD;  Location: [place] [facility];  Service: Cardiovascular;  Laterality: N/A;  . Cardiac catheterization N/A 11/28/2015    Procedure:  Coronary Balloon Angioplasty;  Surgeon: [name], MD;  Location: [place] [facility];  Service: Cardiovascular;  Laterality: N/A;  ostial LAD  . Cardiac catheterization N/A 11/28/2015    Procedure: Coronary/Graft Angiography;  Surgeon: [name], MD;  Location: [place] [facility];  Service: Cardiovascular;  Laterality: N/A;  coronaries only   . Coronary artery bypass graft N/A 11/28/2015    Procedure: CORONARY ARTERY BYPASS GRAFTING (CABG) TIMES FIVE USING LEFT INTERNAL MAMMARY ARTERY AND RIGHT GREATER SAPHENOUS,VIEN HARVEATED BY ENDOVIEN, INTRAOPPRATIVE TEE;  Surgeon: Gaye Pollack, MD;  Location: [place];  Service: Open Heart Surgery;  Laterality: N/A;     Current Outpatient Prescriptions  Medication Sig Dispense Refill  . aspirin 81 MG EC tablet Take 1 tablet (81 mg total) by mouth daily.    . chlordiazePOXIDE (LIBRIUM) 25 MG capsule Take 25 mg by mouth daily as needed for anxiety.     . clopidogrel (PLAVIX) 75 MG tablet Take 1 tablet (75 mg total) by mouth daily. 30 tablet 11  . lisinopril (PRINIVIL,ZESTRIL) 5 MG tablet Take 1 tablet (5 mg total) by mouth daily. On hold 30 tablet 1  . metoprolol (LOPRESSOR) 50  MG tablet Take 0.5 tablets (25 mg total) by mouth 2 (two) times daily. 60 tablet 1  . Multiple Vitamins-Minerals (ONE-A-DAY MENS 50+ ADVANTAGE PO) Take 1 tablet by mouth daily.     . pantoprazole (PROTONIX) 40 MG tablet Take 40 mg by mouth daily. FOR REFLUX    . simvastatin (ZOCOR) 40 MG tablet Take 1 tablet (40 mg total) by mouth daily at 6 PM. 30 tablet 1  . zolpidem (AMBIEN) 10 MG tablet Take 10 mg by mouth at bedtime as needed for sleep. Reported on 02/09/2016     No current facility-administered medications for this visit.    Allergies:   Prednisone; Tetanus toxoids; Wellbutrin; and Chantix    Social History:  The patient  reports that he quit smoking about 2 months ago. His smoking use included Cigarettes. He smoked 0.50 packs per day. He has never used  smokeless tobacco. He reports that he does not drink alcohol or use illicit drugs.   Family History:  The patient's family history includes Heart Problems in his father; Heart attack in his maternal grandmother and paternal uncle; Heart attack (age of onset: 54) in his father; Hypertension in his brother; Lung cancer in his mother; Stroke in his father and maternal grandmother. There is no history of Autoimmune disease.    ROS:  Please see the history of present illness.   Otherwise, review of systems are positive for fatigue.   All other systems are reviewed and negative.    PHYSICAL EXAM: VS:  BP 120/80 mmHg  Pulse 60  Ht '5\' 9"'$  (1.753 m)  Wt 173 lb (78.472 kg)  BMI 25.54 kg/m2 , BMI Body mass index is 25.54 kg/(m^2). GEN: Well nourished, well developed, in no acute distress HEENT: normal Neck: no JVD, carotid bruits, or masses Cardiac: RRR; no murmurs, rubs, or gallops,no edema  Respiratory:  clear to auscultation bilaterally, normal work of breathing GI: soft, nontender, nondistended, + BS MS: no deformity or atrophy Skin: warm and dry, no rash Neuro:  Strength and sensation are intact Psych: euthymic mood, full affect    Recent Labs: 11/28/2015: ALT 24 11/29/2015: Magnesium 2.5* 12/25/2015: BUN 22; Creat 1.23; Hemoglobin 13.0*; Platelets 346; Potassium 5.3; Sodium 141   Lipid Panel    Component Value Date/Time   CHOL 202* 11/28/2015 1010   TRIG 167* 11/28/2015 1010   HDL 42 11/28/2015 1010   CHOLHDL 4.8 11/28/2015 1010   VLDL 33 11/28/2015 1010   LDLCALC 127* 11/28/2015 1010     Other studies Reviewed: Additional studies/ records that were reviewed today with results demonstrating: Cath results reviewed.   ASSESSMENT AND PLAN:  1. CAD/anterior MI: s/p CABG x 4.  No angina. Continue aggressive secondary prevention. Continue cardiac rehabilitation. 2. Ischemic cardiomyopathy: EF improved to 45-50% range. No signs of CHF. 3. Hyperlipidemia:  Check lipids today.  Consider changing statin to atorvastatin. 4. Fatigue: I think this is likely normal for being 2-1/2 months post bypass surgery and anterior MI. Could consider changing statin if symptoms don't improve.   Current medicines are reviewed at length with the patient today.  The patient concerns regarding his medicines were addressed.  The following changes have been made:  No change  Labs/ tests ordered today include: CMet and lipids No orders of the defined types were placed in this encounter.    Recommend 150 minutes/week of aerobic exercise Low fat, low carb, high fiber diet recommended  Disposition:   FU in 6 months   Signed, Larae Grooms,  MD  02/12/2016 10:17 AM    [facility] [place], [place], [place]  [address] Phone: [phone]; Fax: [phone]

## 2016-02-11 NOTE — Telephone Encounter (Signed)
PC received from pt c/o T-100.0, BP: 153/91 upon awakening this am. Pt c/o mild GI upset otherwise asymptomatic. Pt instructed to increase fluid intake and rest today. Pt reassured about BP reading.  Pt instructed to take home BP readings to scheduled Dr. Irish Lack appt tomorrow.  Pt also instructed to bring home BP cuff to next cardiac rehab visit for calibration. Lastly pt c/o continued anxiety concerning heart disease. Pt offered appt with Jeanella Craze, hospital chaplain for counseling. Pt accepting of offer. LM with spiritual care office to schedule appt.  Pt verbalized understanding.

## 2016-02-12 ENCOUNTER — Ambulatory Visit (INDEPENDENT_AMBULATORY_CARE_PROVIDER_SITE_OTHER): Payer: BC Managed Care – PPO | Admitting: Interventional Cardiology

## 2016-02-12 ENCOUNTER — Encounter: Payer: Self-pay | Admitting: Interventional Cardiology

## 2016-02-12 ENCOUNTER — Other Ambulatory Visit (INDEPENDENT_AMBULATORY_CARE_PROVIDER_SITE_OTHER): Payer: BC Managed Care – PPO | Admitting: *Deleted

## 2016-02-12 VITALS — BP 120/80 | HR 60 | Ht 69.0 in | Wt 173.0 lb

## 2016-02-12 DIAGNOSIS — E785 Hyperlipidemia, unspecified: Secondary | ICD-10-CM

## 2016-02-12 DIAGNOSIS — I2510 Atherosclerotic heart disease of native coronary artery without angina pectoris: Secondary | ICD-10-CM

## 2016-02-12 DIAGNOSIS — I252 Old myocardial infarction: Secondary | ICD-10-CM | POA: Diagnosis not present

## 2016-02-12 DIAGNOSIS — R5382 Chronic fatigue, unspecified: Secondary | ICD-10-CM | POA: Diagnosis not present

## 2016-02-12 MED ORDER — NITROGLYCERIN 0.4 MG SL SUBL: 0.4000 mg | SUBLINGUAL_TABLET | SUBLINGUAL | Status: DC | PRN

## 2016-02-12 NOTE — Patient Instructions (Signed)
**Note De-Identified  Obfuscation** Medication Instructions:  Start using Nitroglycerin as needed for chest pain/discomfort-please follow instructions given to you today and on pill bottle. All other medications remain the same.  Labwork: CMET and Lipids today  Testing/Procedures: None  Follow-Up: Your physician wants you to follow-up in: 6 months. You will receive a reminder letter in the mail two months in advance. If you don't receive a letter, please call our office to schedule the follow-up appointment.     If you need a refill on your cardiac medications before your next appointment, please call your pharmacy.

## 2016-02-13 ENCOUNTER — Encounter (HOSPITAL_COMMUNITY): Payer: BC Managed Care – PPO

## 2016-02-13 ENCOUNTER — Telehealth: Payer: Self-pay | Admitting: *Deleted

## 2016-02-13 DIAGNOSIS — I2510 Atherosclerotic heart disease of native coronary artery without angina pectoris: Secondary | ICD-10-CM

## 2016-02-13 DIAGNOSIS — E785 Hyperlipidemia, unspecified: Secondary | ICD-10-CM

## 2016-02-13 DIAGNOSIS — I255 Ischemic cardiomyopathy: Secondary | ICD-10-CM

## 2016-02-13 LAB — HEPATIC FUNCTION PANEL
ALT: 17 U/L (ref 9–46)
AST: 16 U/L (ref 10–35)
Albumin: 4.8 g/dL (ref 3.6–5.1)
Alkaline Phosphatase: 80 U/L (ref 40–115)
Bilirubin, Direct: 0.1 mg/dL (ref <=0.2)
Indirect Bilirubin: 0.4 mg/dL (ref 0.2–1.2)
Total Bilirubin: 0.5 mg/dL (ref 0.2–1.2)
Total Protein: 7.4 g/dL (ref 6.1–8.1)

## 2016-02-13 LAB — COMPREHENSIVE METABOLIC PANEL
ALT: 18 U/L (ref 9–46)
AST: 15 U/L (ref 10–35)
Albumin: 4.8 g/dL (ref 3.6–5.1)
Alkaline Phosphatase: 80 U/L (ref 40–115)
BUN: 20 mg/dL (ref 7–25)
CO2: 28 mmol/L (ref 20–31)
Calcium: 10.4 mg/dL — ABNORMAL HIGH (ref 8.6–10.3)
Chloride: 105 mmol/L (ref 98–110)
Creat: 1 mg/dL (ref 0.70–1.25)
Glucose, Bld: 91 mg/dL (ref 65–99)
Potassium: 4.5 mmol/L (ref 3.5–5.3)
Sodium: 140 mmol/L (ref 135–146)
Total Bilirubin: 0.5 mg/dL (ref 0.2–1.2)
Total Protein: 7.4 g/dL (ref 6.1–8.1)

## 2016-02-13 LAB — LIPID PANEL
Cholesterol: 164 mg/dL (ref 125–200)
Cholesterol: 165 mg/dL (ref 125–200)
HDL: 47 mg/dL (ref >=40)
HDL: 49 mg/dL (ref >=40)
LDL Cholesterol: 88 mg/dL (ref <130)
LDL Cholesterol: 88 mg/dL (ref <130)
Total CHOL/HDL Ratio: 3.5 Ratio (ref <=5.0)
Total CHOL/HDL Ratio: 3.4 Ratio (ref <=5.0)
Triglycerides: 143 mg/dL (ref <150)
Triglycerides: 142 mg/dL (ref <150)
VLDL: 29 mg/dL (ref <30)
VLDL: 28 mg/dL (ref <30)

## 2016-02-13 MED ORDER — ATORVASTATIN CALCIUM 80 MG PO TABS: 80.0000 mg | ORAL_TABLET | Freq: Every day | ORAL | Status: DC

## 2016-02-13 MED ORDER — METOPROLOL SUCCINATE ER 50 MG PO TB24: 50.0000 mg | ORAL_TABLET | Freq: Every day | ORAL | Status: DC

## 2016-02-13 NOTE — Telephone Encounter (Signed)
Spoke with pt, aware of lab results. He reports fatigue and feeling tired at times. Discussed with dr Irish Lack, will change lopressor to toprol and advised to take at bedtime. New script sent to the pharmacy for atorvastatin and toprol. Pt to call if symptoms do not improve. Follow up scheduled for lab work in 8 weeks.

## 2016-02-13 NOTE — Telephone Encounter (Signed)
-----   Message from Liliane Shi, [place] sent at 02/13/2016  1:10 PM EDT ----- LFTs okay LDL not at goal. Goal LDL is <70 DC simvastatin Start atorvastatin 80 mg daily Arrange lipids and LFTs in 8 weeks. [name], PA-C   02/13/2016 1:10 PM

## 2016-02-16 ENCOUNTER — Telehealth: Payer: Self-pay

## 2016-02-16 ENCOUNTER — Encounter (HOSPITAL_COMMUNITY)
Admission: RE | Admit: 2016-02-16 | Payer: BC Managed Care – PPO | Source: Ambulatory Visit | Attending: Interventional Cardiology | Admitting: Interventional Cardiology

## 2016-02-16 ENCOUNTER — Other Ambulatory Visit: Payer: Self-pay

## 2016-02-16 ENCOUNTER — Telehealth (HOSPITAL_COMMUNITY): Payer: Self-pay | Admitting: Cardiac Rehabilitation

## 2016-02-16 DIAGNOSIS — I255 Ischemic cardiomyopathy: Secondary | ICD-10-CM

## 2016-02-16 DIAGNOSIS — I2109 ST elevation (STEMI) myocardial infarction involving other coronary artery of anterior wall: Secondary | ICD-10-CM

## 2016-02-16 MED ORDER — ATORVASTATIN CALCIUM 40 MG PO TABS: 40.0000 mg | ORAL_TABLET | Freq: Every day | ORAL | Status: DC

## 2016-02-16 MED ORDER — LISINOPRIL 10 MG PO TABS: 10.0000 mg | ORAL_TABLET | Freq: Every day | ORAL | Status: DC

## 2016-02-16 NOTE — Telephone Encounter (Signed)
**Note De-Identified  Obfuscation** Per Truitt Merle, NP the pt is advised to increase his Lisinopril to 10 mg daily and to have a BMET drawn in 1 week. He verbalized understanding and is in agreement with plan.

## 2016-02-16 NOTE — Telephone Encounter (Signed)
**Note De-Identified  Obfuscation** The pt called refills dept to speak to me, Dr Hassell Done nurse.  He c/o his BP "going all over the place".   He states that he takes his Metoprolol 50 mg at about 9 pm every night and that he checks his BP about an hour after and his BP is normal.   He reports that he checks his BP 3 X daily and that his BP is elevated in the morning when he wakes up and continues to go up through out the day. He states that his BP is 155/95 at this time.  He is scheduled to have cardiac rehab today but is relutant to go because he feels they will send him home without rehab due to his elevated BP. He is advised to call Cardiac rehab and tell them what his BP is at this time and they will advise him as to whether he should have rehab today or not.   He is also advised that I will discuss with our DOD and call him back. He verbalized understanding.

## 2016-02-16 NOTE — Telephone Encounter (Signed)
appt scheduled with Jeanella Craze on Wednesday February 25, 2016 @ 4pm.  Written instructions given.

## 2016-02-16 NOTE — Telephone Encounter (Signed)
patient called in with questions regarding his Metoprolol. He would like a call back [phone]

## 2016-02-18 ENCOUNTER — Other Ambulatory Visit: Payer: Self-pay

## 2016-02-18 ENCOUNTER — Encounter (HOSPITAL_COMMUNITY): Payer: BC Managed Care – PPO

## 2016-02-18 ENCOUNTER — Telehealth: Payer: Self-pay | Admitting: Interventional Cardiology

## 2016-02-18 MED ORDER — LISINOPRIL 10 MG PO TABS
10.0000 mg | ORAL_TABLET | Freq: Every day | ORAL | Status: DC
Start: 2016-02-18 — End: 2016-02-25

## 2016-02-18 NOTE — Telephone Encounter (Signed)
**Note De-Identified  Obfuscation** The pt states that his BP at this time is 155/95. I asked if he has taken his Lisinopril 10 mg this am as advised by telephone on 6/19. He stated that he thought he was suppose to be taking 5 mg BID. I advised him to take 10 mg of Lisinopril in the am and to continue to take Metoprolol 50 mg in the PM. He verbalized understanding and correctly repeated instructions back to me.  Per his request I called cardiac rehab and canceled his 2:30 appt with them today as his BP is too high to have rehab. They thanked me for calling with update.

## 2016-02-18 NOTE — Telephone Encounter (Signed)
New message     Pt c/o BP issue: STAT if pt c/o blurred vision, one-sided weakness or slurred speech  1. What are your last 5 BP readings? 155/94  2. Are you having any other symptoms (ex. Dizziness, headache, blurred vision, passed out)? Pt states he does not feel good---  3. What is your BP issue?  Pt is due to be at cardiac rehab at 2:30 and do not think he will be able to participate with this high bp.  He needs to leave at 2pm if he is going to rehab.  Please call ASAP

## 2016-02-19 ENCOUNTER — Telehealth: Payer: Self-pay | Admitting: Interventional Cardiology

## 2016-02-19 MED ORDER — METOPROLOL TARTRATE 50 MG PO TABS: 25.0000 mg | ORAL_TABLET | Freq: Two times a day (BID) | ORAL | Status: DC

## 2016-02-19 NOTE — Telephone Encounter (Signed)
Per Jinny Blossom, [facility], patient is to STOP TOPROL and RESTART LOPRESSOR 25 mg BID. HTN Clinic to be rescheduled to early next week.

## 2016-02-19 NOTE — Telephone Encounter (Signed)
Instructed patient to STOP TOPROL and RESTART LOPRESSOR 25 mg BID. HTN Clinic scheduled Tuesday, 6/27.  Patient will call Monday with an update to how he is feeling.

## 2016-02-19 NOTE — Telephone Encounter (Signed)
[name] is calling because he spoke w/ [name] ( Dr. Irish Lack Nurse) on yesterday and he did what she told him to do , but he woke up in the middle of the night with a really bad stomach ache and had extremely bad diarrhea  and some vomitting. Also has a red rash under his neck. Through out the night his blood pressure has been elevated . Please call

## 2016-02-19 NOTE — Telephone Encounter (Signed)
Patient st he hasn't really felt well since changing from Metoprolol to Toprol XL 50 mg last week. He started taking the medication at night because it made him tired.  He was also taking Lisinopril 5 mg daily and was instructed to take 10 mg. (see 6/21 phone note) For the last 3 days, he has woken up at 0200-0300 with terrible stomach aches and has been experiencing diarrhea.  Today, he had diarrhea and vomited from about 0200-0900. Last night was the only night he took both medications together because his BP was high. He denies any other symptoms. He blames the symptoms on the medications and does not believe they are due to an infection.  His BP has been running in the 140s and 150s. He reports he has been told from Cardiac Rehab a few times that he could not continue exercise because his BP got too high. Today, his BP was 135/80. He did not take his medications this AM.  Scheduled patient to be seen in the HTN Clinic tomorrow. He was grateful for assistance.

## 2016-02-20 ENCOUNTER — Encounter (HOSPITAL_COMMUNITY)
Admission: RE | Admit: 2016-02-20 | Discharge: 2016-02-20 | Disposition: A | Discharge status: Home / Self Care | Payer: BC Managed Care – PPO | Source: Ambulatory Visit | Attending: Interventional Cardiology | Admitting: Interventional Cardiology

## 2016-02-20 ENCOUNTER — Ambulatory Visit: Payer: BC Managed Care – PPO

## 2016-02-20 NOTE — Progress Notes (Signed)
Cardiac Individual Treatment Plan  Patient Details  Name: [name] MRN: [id number] Date of Birth: August 10, 1956 Referring Provider:        CARDIAC REHAB PHASE II ORIENTATION from 02/03/2016 in [place]   Referring Provider  Larae Grooms MD      Initial Encounter Date:       CARDIAC REHAB PHASE II ORIENTATION from 02/03/2016 in [place]   Date  02/03/16   Referring Provider  Larae Grooms MD      Visit Diagnosis: No diagnosis found.  Patient's Home Medications on Admission:  Current outpatient prescriptions:  .  aspirin 81 MG EC tablet, Take 1 tablet (81 mg total) by mouth daily., Disp: , Rfl:  .  atorvastatin (LIPITOR) 40 MG tablet, Take 1 tablet (40 mg total) by mouth daily., Disp: 30 tablet, Rfl: 6 .  chlordiazePOXIDE (LIBRIUM) 25 MG capsule, Take 25 mg by mouth daily as needed for anxiety. , Disp: , Rfl:  .  clopidogrel (PLAVIX) 75 MG tablet, Take 1 tablet (75 mg total) by mouth daily., Disp: 30 tablet, Rfl: 11 .  lisinopril (PRINIVIL,ZESTRIL) 10 MG tablet, Take 1 tablet (10 mg total) by mouth daily., Disp: 30 tablet, Rfl: 6 .  metoprolol (LOPRESSOR) 50 MG tablet, Take 0.5 tablets (25 mg total) by mouth 2 (two) times daily., Disp: 30 tablet, Rfl: 6 .  Multiple Vitamins-Minerals (ONE-A-DAY MENS 50+ ADVANTAGE PO), Take 1 tablet by mouth daily. , Disp: , Rfl:  .  nitroGLYCERIN (NITROSTAT) 0.4 MG SL tablet, Place 1 tablet (0.4 mg total) under the tongue every 5 (five) minutes as needed for chest pain., Disp: 25 tablet, Rfl: 3 .  pantoprazole (PROTONIX) 40 MG tablet, Take 40 mg by mouth daily. FOR REFLUX, Disp: , Rfl:  .  zolpidem (AMBIEN) 10 MG tablet, Take 10 mg by mouth at bedtime as needed for sleep. Reported on 02/09/2016, Disp: , Rfl:   Past Medical History: Past Medical History  Diagnosis Date  . Anxiety   . Esophageal reflux     eosinophil esophagitis  . Acute hepatitis C without mention of  hepatic coma     Tx by [facility] for Hep.  negative for hep c 2/15.  . Colitis   . CAD (coronary artery disease)     a. ant STEMI >> LHC with 3 v CAD; oLAD tx with POBA >> emergent CABG  . Ischemic cardiomyopathy     a. EF 25-30% at intraop TEE 4/17  //  b. Limited Echo 5/17 - EF 45-50%, mild ant HK  . Chronic systolic CHF (congestive heart failure) (HCC)     Tobacco Use: History  Smoking status  . Former Smoker -- 0.50 packs/day  . Types: Cigarettes  . Quit date: 11/28/2015  Smokeless tobacco  . Never Used    Labs: Recent Review Flowsheet Data    Labs for ITP Cardiac and Pulmonary Rehab Latest Ref Rng 11/29/2015 11/29/2015 11/29/2015 02/12/2016 02/12/2016   Cholestrol 125 - 200 mg/dL - - - 164 165   LDLCALC <130 mg/dL - - - 88 88   HDL >=40 mg/dL - - - 47 49   Trlycerides <150 mg/dL - - - 143 142   PHART 7.350 - 7.450 - 7.415 - - -   PCO2ART 35.0 - 45.0 mmHg - 43.1 - - -   HCO3 20.0 - 24.0 mEq/L - 27.7(H) - - -   TCO2 0 - 100 mmol/L '24 29 23 '$ - -  O2SAT - - 98.0 - - -      Capillary Blood Glucose: Lab Results  Component Value Date   GLUCAP 104* 11/30/2015   GLUCAP 106* 11/30/2015   GLUCAP 93 11/29/2015   GLUCAP 129* 11/29/2015   GLUCAP 98 11/29/2015     Exercise Target Goals:    Exercise Program Goal: Individual exercise prescription set with THRR, safety & activity barriers. Participant demonstrates ability to understand and report RPE using BORG scale, to self-measure pulse accurately, and to acknowledge the importance of the exercise prescription.  Exercise Prescription Goal: Starting with aerobic activity 30 plus minutes a day, 3 days per week for initial exercise prescription. Provide home exercise prescription and guidelines that participant acknowledges understanding prior to discharge.  Activity Barriers & Risk Stratification:     Activity Barriers & Cardiac Risk Stratification - 02/03/16 1418    Activity Barriers & Cardiac Risk Stratification   Activity  Barriers Other (comment)   Comments Rare gout. Occassionaly fluid on rt knee.   Cardiac Risk Stratification High      6 Minute Walk:     6 Minute Walk      02/03/16 1629       6 Minute Walk   Phase Initial     Distance 1589 feet     Walk Time 6 minutes     # of Rest Breaks 0     MPH 3.01     METS 3.89     RPE 11     VO2 Peak 13.61     Symptoms Yes (comment)     Comments Lightheaded     Resting HR 55 bpm     Resting BP 111/72 mmHg     Max Ex. HR 105 bpm     Max Ex. BP 94/60 mmHg     2 Minute Post BP 102/64 mmHg        Initial Exercise Prescription:     Initial Exercise Prescription - 02/03/16 1600    Date of Initial Exercise RX and Referring Provider   Date 02/03/16   Referring Provider Larae Grooms MD   Treadmill   MPH 2   Grade 1   Minutes 10   METs 2.81   Bike   Level 1   Minutes 10   METs 3.44   NuStep   Level 2   Minutes 10   METs 2.5   Prescription Details   Frequency (times per week) 3   Duration Progress to 30 minutes of continuous aerobic without signs/symptoms of physical distress   Intensity   THRR 40-80% of Max Heartrate 64-128 bpm   Ratings of Perceived Exertion 11-13   Perceived Dyspnea 0-4   Progression   Progression Continue to progress workloads to maintain intensity without signs/symptoms of physical distress.   Resistance Training   Training Prescription Yes   Weight 2 lbs   Reps 10-12      Perform Capillary Blood Glucose checks as needed.  Exercise Prescription Changes:   Exercise Comments:   Discharge Exercise Prescription (Final Exercise Prescription Changes):   Nutrition:  Target Goals: Understanding of nutrition guidelines, daily intake of sodium '1500mg'$ , cholesterol '200mg'$ , calories 30% from fat and 7% or less from saturated fats, daily to have 5 or more servings of fruits and vegetables.  Biometrics:     Pre Biometrics - 02/03/16 1627    Pre Biometrics   Height 5' 9.25" (1.759 m)   Weight 172 lb 2.9  oz (78.1 kg)   Waist  Circumference 34.25 inches   Hip Circumference 39.25 inches   Waist to Hip Ratio 0.87 %   BMI (Calculated) 25.3   Triceps Skinfold 15 mm   % Body Fat 23.5 %   Grip Strength 37.5 kg   Flexibility 15 in   Single Leg Stand 25 seconds       Nutrition Therapy Plan and Nutrition Goals:     Nutrition Therapy & Goals - 02/05/16 0957    Nutrition Therapy   Diet Therapeutic Lifestyle Changes   Personal Nutrition Goals   Personal Goal #1 Maintain wt around 172 lb while in [place], educate and counsel regarding individualized specific dietary modifications aiming towards targeted core components such as weight, hypertension, lipid management, diabetes, heart failure and other comorbidities.   Expected Outcomes Short Term Goal: Understand basic principles of dietary content, such as calories, fat, sodium, cholesterol and nutrients.;Long Term Goal: Adherence to prescribed nutrition plan.      Nutrition Discharge: Nutrition Scores:     Nutrition Assessments - 02/05/16 0959    MEDFICTS Scores   Pre Score 36      Nutrition Goals Re-Evaluation:   Psychosocial: Target Goals: Acknowledge presence or absence of depression, maximize coping skills, provide positive support system. Participant is able to verbalize types and ability to use techniques and skills needed for reducing stress and depression.  Initial Review & Psychosocial Screening:     Initial Psych Review & Screening - 02/05/16 [place]? Yes   Barriers   Psychosocial barriers to participate in program There are no identifiable barriers or psychosocial needs.   Screening Interventions   Interventions Encouraged to exercise      Quality of Life Scores:     Quality of Life - 02/03/16 1628    Quality of Life Scores   Health/Function Pre 16.93 %   Socioeconomic Pre 21.57 %   Psych/Spiritual Pre 22.36 %   Family  Pre 28.8 %   GLOBAL Pre 20.75 %      PHQ-9:     Recent Review Flowsheet Data    Depression screen [facility] 2/9 02/09/2016   Decreased Interest 2   Down, Depressed, Hopeless 2   PHQ - 2 Score 4   Altered sleeping 2   Tired, decreased energy 0   Change in appetite 0   Feeling bad or failure about yourself  0   Trouble concentrating 0   Moving slowly or fidgety/restless 0   Suicidal thoughts 0   PHQ-9 Score 6   Difficult doing work/chores Somewhat difficult      Psychosocial Evaluation and Intervention:   Psychosocial Re-Evaluation:   Vocational Rehabilitation: Provide vocational rehab assistance to qualifying candidates.   Vocational Rehab Evaluation & Intervention:     Vocational Rehab - 02/05/16 1013    Initial Vocational Rehab Evaluation & Intervention   Assessment shows need for Vocational Rehabilitation No      Education: Education Goals: Education classes will be provided on a weekly basis, covering required topics. Participant will state understanding/return demonstration of topics presented.  Learning Barriers/Preferences:     Learning Barriers/Preferences - 02/05/16 1007    Learning Barriers/Preferences   Learning Barriers --  wears glasses   Learning Preferences Written Material;Audio      Education Topics: Count Your Pulse:  -Group instruction provided by verbal instruction, demonstration, patient participation and written materials to support subject.  Instructors address importance of being  able to find your pulse and how to count your pulse when at home without a heart monitor.  Patients get hands on experience counting their pulse with staff help and individually.   Heart Attack, Angina, and Risk Factor Modification:  -Group instruction provided by verbal instruction, video, and written materials to support subject.  Instructors address signs and symptoms of angina and heart attacks.    Also discuss risk factors for heart disease and how to make  changes to improve heart health risk factors.   Functional Fitness:  -Group instruction provided by verbal instruction, demonstration, patient participation, and written materials to support subject.  Instructors address safety measures for doing things around the house.  Discuss how to get up and down off the floor, how to pick things up properly, how to safely get out of a chair without assistance, and balance training.   Meditation and Mindfulness:  -Group instruction provided by verbal instruction, patient participation, and written materials to support subject.  Instructor addresses importance of mindfulness and meditation practice to help reduce stress and improve awareness.  Instructor also leads participants through a meditation exercise.    Stretching for Flexibility and Mobility:  -Group instruction provided by verbal instruction, patient participation, and written materials to support subject.  Instructors lead participants through series of stretches that are designed to increase flexibility thus improving mobility.  These stretches are additional exercise for major muscle groups that are typically performed during regular warm up and cool down.   Hands Only CPR Anytime:  -Group instruction provided by verbal instruction, video, patient participation and written materials to support subject.  Instructors co-teach with AHA video for hands only CPR.  Participants get hands on experience with mannequins.   Nutrition I class: Heart Healthy Eating:  -Group instruction provided by PowerPoint slides, verbal discussion, and written materials to support subject matter. The instructor gives an explanation and review of the Therapeutic Lifestyle Changes diet recommendations, which includes a discussion on lipid goals, dietary fat, sodium, fiber, plant stanol/sterol esters, sugar, and the components of a well-balanced, healthy diet.   Nutrition II class: Lifestyle Skills:  -Group instruction  provided by PowerPoint slides, verbal discussion, and written materials to support subject matter. The instructor gives an explanation and review of label reading, grocery shopping for heart health, heart healthy recipe modifications, and ways to make healthier choices when eating out.   Diabetes Question & Answer:  -Group instruction provided by PowerPoint slides, verbal discussion, and written materials to support subject matter. The instructor gives an explanation and review of diabetes co-morbidities, pre- and post-prandial blood glucose goals, pre-exercise blood glucose goals, signs, symptoms, and treatment of hypoglycemia and hyperglycemia, and foot care basics.   Diabetes Blitz:  -Group instruction provided by PowerPoint slides, verbal discussion, and written materials to support subject matter. The instructor gives an explanation and review of the physiology behind type 1 and type 2 diabetes, diabetes medications and rational behind using different medications, pre- and post-prandial blood glucose recommendations and Hemoglobin A1c goals, diabetes diet, and exercise including blood glucose guidelines for exercising safely.    Portion Distortion:  -Group instruction provided by PowerPoint slides, verbal discussion, written materials, and food models to support subject matter. The instructor gives an explanation of serving size versus portion size, changes in portions sizes over the last 20 years, and what consists of a serving from each food group.   Stress Management:  -Group instruction provided by verbal instruction, video, and written materials to support subject matter.  Instructors review role of stress in heart disease and how to cope with stress positively.     Exercising on Your Own:  -Group instruction provided by verbal instruction, power point, and written materials to support subject.  Instructors discuss benefits of exercise, components of exercise, frequency and intensity of  exercise, and end points for exercise.  Also discuss use of nitroglycerin and activating EMS.  Review options of places to exercise outside of rehab.  Review guidelines for sex with heart disease.   Cardiac Drugs I:  -Group instruction provided by verbal instruction and written materials to support subject.  Instructor reviews cardiac drug classes: antiplatelets, anticoagulants, beta blockers, and statins.  Instructor discusses reasons, side effects, and lifestyle considerations for each drug class.   Cardiac Drugs II:  -Group instruction provided by verbal instruction and written materials to support subject.  Instructor reviews cardiac drug classes: angiotensin converting enzyme inhibitors (ACE-I), angiotensin II receptor blockers (ARBs), nitrates, and calcium channel blockers.  Instructor discusses reasons, side effects, and lifestyle considerations for each drug class.   Anatomy and Physiology of the Circulatory System:  -Group instruction provided by verbal instruction, video, and written materials to support subject.  Reviews functional anatomy of heart, how it relates to various diagnoses, and what role the heart plays in the overall system.   Knowledge Questionnaire Score:     Knowledge Questionnaire Score - 02/03/16 1629    Knowledge Questionnaire Score   Pre Score 20/24      Core Components/Risk Factors/Patient Goals at Admission:     Personal Goals and Risk Factors at Admission - 02/03/16 1648    Core Components/Risk Factors/Patient Goals on Admission   Tobacco Cessation Yes   Number of packs per day <1   Intervention Assist the participant in steps to quit. Provide individualized education and counseling about committing to Tobacco Cessation, relapse prevention, and pharmacological support that can be provided by physician.   Expected Outcomes Short Term: Will quit all tobacco product use, adhering to prevention of relapse plan.;Long Term: Complete abstinence from all  tobacco products for at least 12 months from quit date.  Patient quit 11/28/15 and is currently abstaining.   Stress Yes   Intervention Offer individual and/or small group education and counseling on adjustment to heart disease, stress management and health-related lifestyle change. Teach and support self-help strategies.;Refer participants experiencing significant psychosocial distress to appropriate mental health specialists for further evaluation and treatment. When possible, include family members and significant others in education/counseling sessions.   Expected Outcomes Short Term: Participant demonstrates changes in health-related behavior, relaxation and other stress management skills, ability to obtain effective social support, and compliance with psychotropic medications if prescribed.;Long Term: Emotional wellbeing is indicated by absence of clinically significant psychosocial distress or social isolation.   Personal Goal Other Yes   Personal Goal Regain energy. Start regular exercise prorgam. Participate in mission trips and help with church activities.   Intervention Provide individualized exercise program to improve cardiorespiratory fitness.   Expected Outcomes Energy and stamina imrpvovement as fitness level increases. Able to participate in more activites without feeling tired.      Core Components/Risk Factors/Patient Goals Review:    Core Components/Risk Factors/Patient Goals at Discharge (Final Review):    ITP Comments:     ITP Comments      02/03/16 1444           ITP Comments Dr. Fransico Him, Medical Director           Comments: Najib has  only attended one full exercise session and has been absent due to blood pressure and GI issues. Daud hopes to return to exercise next week.

## 2016-02-23 ENCOUNTER — Telehealth: Payer: Self-pay | Admitting: Cardiology

## 2016-02-23 ENCOUNTER — Other Ambulatory Visit: Payer: BC Managed Care – PPO

## 2016-02-23 ENCOUNTER — Encounter (HOSPITAL_COMMUNITY)
Admission: RE | Admit: 2016-02-23 | Discharge: 2016-02-23 | Disposition: A | Discharge status: Home / Self Care | Payer: BC Managed Care – PPO | Source: Ambulatory Visit | Attending: Interventional Cardiology | Admitting: Interventional Cardiology

## 2016-02-23 DIAGNOSIS — Z951 Presence of aortocoronary bypass graft: Secondary | ICD-10-CM | POA: Diagnosis not present

## 2016-02-23 DIAGNOSIS — I213 ST elevation (STEMI) myocardial infarction of unspecified site: Secondary | ICD-10-CM

## 2016-02-23 NOTE — Telephone Encounter (Signed)
Cardiac rehab called due to symptomatic hypotension.  BP dropped with standing to 98/60 and pt felt dizzy.  His lisinopril has been increased to 10 mg.  After gatorade and time BP now 126/86.  I have asked him to go back to his 5 mg daily, may be too early for med adjustment.  He will begin tomorrow.  He has appt with pharmacy in our office tomorrow.

## 2016-02-23 NOTE — Progress Notes (Signed)
Patient returned to exercise today. [name] reported feeling lightheaded after getting off the treadmill this afternoon. Sitting blood pressure 122/60. Standing blood pressure 98/60. Patient given Gatorade. Recheck blood pressure 98/60 sitting and standing. Patient was given graham crackers, banana and more Gatorade to drink. Recheck blood pressure 128/86 sitting and 127/80 standing.  Anthony Kicks FNP-C called and notified. [name] instructed [name] to decrease his lisinopril to 5 mg once a day. Patient states understanding. No complaints upon exit from cardiac rehab. Will continue to monitor the patient throughout  the program.

## 2016-02-24 ENCOUNTER — Telehealth: Payer: Self-pay | Admitting: Interventional Cardiology

## 2016-02-24 ENCOUNTER — Ambulatory Visit: Payer: BC Managed Care – PPO

## 2016-02-24 NOTE — Telephone Encounter (Signed)
[name] is calling because he is having some dizziness and bp is high . Dont think the medication is working. Please call   Thanks

## 2016-02-24 NOTE — Telephone Encounter (Signed)
Spoke with pt and he had an "episode" at Cardiac Rehab yesterday where his BP dropped to 98/60 and he became dizzy. Pt states he is still dizzy today and has felt a little "foggy". Pt cancelled appt with HTN clinic today because he wasn't feeling well. Lowest BP today is 113/75 and highest was 145/mid 90's, HR 50's. Pt states BP is usually high in AM and usually comes down after he takes his Lisinopril. Advised pt we needed to get him in to see HTN clinic.  Rescheduled pt for 02/26/16 at 0830. Pt would like to know if Dr. Irish Lack thinks he needs any med adjustments prior to being seen on Thurs? Advised I will send message to Dr. Irish Lack for review and advisement.

## 2016-02-25 ENCOUNTER — Encounter (HOSPITAL_COMMUNITY)
Admission: RE | Admit: 2016-02-25 | Discharge: 2016-02-25 | Disposition: A | Discharge status: Home / Self Care | Payer: BC Managed Care – PPO | Source: Ambulatory Visit | Attending: Interventional Cardiology | Admitting: Interventional Cardiology

## 2016-02-25 DIAGNOSIS — Z951 Presence of aortocoronary bypass graft: Secondary | ICD-10-CM | POA: Diagnosis not present

## 2016-02-25 DIAGNOSIS — I213 ST elevation (STEMI) myocardial infarction of unspecified site: Secondary | ICD-10-CM

## 2016-02-25 NOTE — Telephone Encounter (Signed)
**Note De-Identified  Obfuscation** The pt wants to know if his medications need to be adjusted. Please advise.

## 2016-02-25 NOTE — Telephone Encounter (Signed)
**Note De-Identified  Obfuscation** Lisinopril decreased in the pts med list to 5 mg per Cecilie Kicks, NP on 6/26 per phone note.  The pt is advised that Dr Hassell Done is in agreement with recommendation from Cecilie Kicks, NP to decrease his Lisinopril to 5 mg daily and to see HTN Clinic as scheduled tomorrow morning. He stated that someone from this office told him to go back up to 10 mg of Lisinopril daily but he cannot remember who told him that and states that he gets easily confused. After reviewing the pts chart I do not see where the pt was advised to increase his Lisinopril back up to 10 mg daily so I advised him to only take 5 mg of Lisinopril daily. He states that he has already taken 10 mg of Lisinopril today and that he has cardiac rehab. He is advised that his BP may drop during rehab due to him taking 10 mg of Lisinopril instead of 5 mg as directed. He verbalized understanding and is in agreement with plan.

## 2016-02-25 NOTE — Telephone Encounter (Signed)
[name] already decreased lisinopril.

## 2016-02-26 ENCOUNTER — Encounter: Payer: Self-pay | Admitting: Pharmacist

## 2016-02-26 ENCOUNTER — Ambulatory Visit (INDEPENDENT_AMBULATORY_CARE_PROVIDER_SITE_OTHER): Payer: BC Managed Care – PPO | Admitting: Pharmacist

## 2016-02-26 VITALS — BP 124/82 | HR 61

## 2016-02-26 DIAGNOSIS — I10 Essential (primary) hypertension: Secondary | ICD-10-CM | POA: Insufficient documentation

## 2016-02-26 NOTE — Progress Notes (Signed)
Patient ID: [name]                 DOB: 1956/05/04                      MRN: [id number]     HPI: Quintez Maselli is a 60 y.o. male referred by Cecilie Kicks, NP to HTN clinic. PMH is significant for CAD, STEMI 11/2015 s/p CABG, GERD, anxiety, and HCV s/p tx. BP was fine at OV with Dr. Irish Lack 2 weeks ago, however pt has called clinic 7 times in the past month with concerns about his blood pressure. Complaints vary from BP being too high for cardiac rehab at 150/90, to BP being too low for cardiac rehab with BP down to 100/60 upon standing. In the past few weeks, lisinopril dose has been changed between '5mg'$  and '10mg'$  on multiple occasions, and metoprolol tartrate was changed to metoprolol succinate which pt reports caused fatigue, stomach ache, vomiting, and diarrhea. Pt denied infection and metoprolol was changed back to tartrate formulation.   Pt reports that BP always runs high in the morning and then levels off during the day. 140-150s/90-100s in AM, HR 70s. Reviewed BP cuff and pt checked BP about 6 times this AM from 4am to 6am. Most readings are well controlled 120s-130s. Takes Lisinopril at lunch time. He took '10mg'$  yesterday - feels that this dose works better for him. Only reports 1 low reading at cardiac rehab and is more concerned overall with high readings he has been seeing at home.  Pt has a [facility] BP cuff - purchased 1 month ago. Home BP cuff reading today: 139/82, HR 65. Clinic reading: 124/82, HR 61. Home cuff overestimating SBP by 14-78 pts, diastolic reading is accurate.  Current HTN meds: metoprolol tartrate '25mg'$  BID, lisinopril '10mg'$  daily Previously tried: metoprolol succinate - pt reported diarrhea, fatigue, stomach ache, and vomiting BP goal: < 140/31mHg  Family History: Father, maternal grandmother, and paternal uncle with MI, brother with HTN, father and maternal grandmother with stroke.  Social History: Patient reports that he does not drink alcohol or use illicit  drugs. Former smoker 1/2 PPD. Quit 11/28/15.  Wt Readings from Last 3 Encounters:  02/12/16 173 lb (78.472 kg)  02/03/16 172 lb 2.9 oz (78.1 kg)  12/31/15 172 lb 9.6 oz (78.291 kg)   BP Readings from Last 3 Encounters:  02/12/16 120/80  02/03/16 111/72  12/31/15 118/79   Pulse Readings from Last 3 Encounters:  02/12/16 60  02/03/16 55  12/31/15 61    Renal function: Estimated Creatinine Clearance: 78.6 mL/min (by C-G formula based on Cr of 1).  Past Medical History  Diagnosis Date  . Anxiety   . Esophageal reflux     eosinophil esophagitis  . Acute hepatitis C without mention of hepatic coma     Tx by [facility] for Hep.  negative for hep c 2/15.  . Colitis   . CAD (coronary artery disease)     a. ant STEMI >> LHC with 3 v CAD; oLAD tx with POBA >> emergent CABG  . Ischemic cardiomyopathy     a. EF 25-30% at intraop TEE 4/17  //  b. Limited Echo 5/17 - EF 45-50%, mild ant HK  . Chronic systolic CHF (congestive heart failure) ([place]     Current Outpatient Prescriptions on File Prior to Visit  Medication Sig Dispense Refill  . aspirin 81 MG EC tablet Take 1 tablet (81 mg total) by mouth daily.    .Marland Kitchen  atorvastatin (LIPITOR) 40 MG tablet Take 1 tablet (40 mg total) by mouth daily. 30 tablet 6  . chlordiazePOXIDE (LIBRIUM) 25 MG capsule Take 25 mg by mouth daily as needed for anxiety.     . clopidogrel (PLAVIX) 75 MG tablet Take 1 tablet (75 mg total) by mouth daily. 30 tablet 11  . lisinopril (PRINIVIL,ZESTRIL) 5 MG tablet Take 1 tablet (5 mg total) by mouth daily.    . metoprolol (LOPRESSOR) 50 MG tablet Take 0.5 tablets (25 mg total) by mouth 2 (two) times daily. 30 tablet 6  . Multiple Vitamins-Minerals (ONE-A-DAY MENS 50+ ADVANTAGE PO) Take 1 tablet by mouth daily.     . nitroGLYCERIN (NITROSTAT) 0.4 MG SL tablet Place 1 tablet (0.4 mg total) under the tongue every 5 (five) minutes as needed for chest pain. 25 tablet 3  . pantoprazole (PROTONIX) 40 MG tablet Take 40 mg by mouth  daily. FOR REFLUX    . zolpidem (AMBIEN) 10 MG tablet Take 10 mg by mouth at bedtime as needed for sleep. Reported on 02/09/2016     No current facility-administered medications on file prior to visit.    Allergies  Allergen Reactions  . Prednisone Other (See Comments)    States that this med makes him "crazy"  . Tetanus Toxoids Swelling and Other (See Comments)    Fever, Swelling of the arm   . Wellbutrin [Bupropion] Other (See Comments)    Crazy thoughts, nightmares  . Chantix [Varenicline] Other (See Comments)    Dreams     Assessment/Plan:  1. HTN - BP readings have consistently been at goal < 140/47mHg in clinic. Home cuff is reading 10-15 pts higher for systolic reading. With this in mind, almost all home readings have been at goal too. No med changes but pt will move lisinopril dose to HS to help prevent elevated readings in AM. Discussed all of pt's medications in depth since he had multiple med-related questions. Pt will try to avoid checking BP more than once or twice a day. He will stay hydrated and eat a snack before cardiac rehab to prevent any lows. Reassured him that BP readings have been normal and advised him to call if SBP < 105 or > 160 consistently.   2. Drug interaction - pt occasionally taking OTC Prilosec for acid reflux. Advised him to avoid this d/t drug interaction with Plavix. Sx are mild and intermittent, he will use Zantac as needed. If sx become more persistent, he will call GI for Protonix refill.  3. Smoking cessation - pt reports that he has not had a cigarette since he had his heart attack 2 months ago. He has a good support system at home. Stopped drinking coffee since this was a trigger for him to smoke. Congratulated him on his success.   Itsel Opfer E. Aza Dantes, PharmD, [place] [place] [address]Phone: [phone] Fax: [phone]/29/2017 10:42 AM

## 2016-02-26 NOTE — Patient Instructions (Signed)
Move your lisinopril '10mg'$  dose to bedtime - this should help your morning blood pressure readings. Subtract ~10 points from your top blood pressure number at home since your cuff reads a bit higher than our clinic cuff.  Call clinic if your top blood pressure readings at home are consistently 160-170.  Try to use only Zantac as needed for your heartburn. If your symptoms become a daily occurrence, let us know and we can send in Protonix for you. Avoid using Prilosec over the counter - this can interact with your Plavix.

## 2016-02-27 ENCOUNTER — Encounter (HOSPITAL_COMMUNITY): Payer: BC Managed Care – PPO

## 2016-03-01 ENCOUNTER — Emergency Department (HOSPITAL_COMMUNITY): Payer: BC Managed Care – PPO

## 2016-03-01 ENCOUNTER — Encounter (HOSPITAL_COMMUNITY): Payer: Self-pay

## 2016-03-01 ENCOUNTER — Telehealth: Payer: Self-pay | Admitting: Interventional Cardiology

## 2016-03-01 ENCOUNTER — Encounter (HOSPITAL_COMMUNITY): Payer: BC Managed Care – PPO

## 2016-03-01 ENCOUNTER — Emergency Department (HOSPITAL_COMMUNITY)
Admission: EM | Admit: 2016-03-01 | Discharge: 2016-03-01 | Disposition: A | Discharge status: Home / Self Care | Payer: BC Managed Care – PPO | Attending: Emergency Medicine | Admitting: Emergency Medicine

## 2016-03-01 DIAGNOSIS — Z7982 Long term (current) use of aspirin: Secondary | ICD-10-CM | POA: Diagnosis not present

## 2016-03-01 DIAGNOSIS — Z79899 Other long term (current) drug therapy: Secondary | ICD-10-CM | POA: Diagnosis not present

## 2016-03-01 DIAGNOSIS — R0789 Other chest pain: Secondary | ICD-10-CM | POA: Diagnosis not present

## 2016-03-01 DIAGNOSIS — I2510 Atherosclerotic heart disease of native coronary artery without angina pectoris: Secondary | ICD-10-CM | POA: Insufficient documentation

## 2016-03-01 DIAGNOSIS — I5022 Chronic systolic (congestive) heart failure: Secondary | ICD-10-CM | POA: Insufficient documentation

## 2016-03-01 DIAGNOSIS — I255 Ischemic cardiomyopathy: Secondary | ICD-10-CM | POA: Insufficient documentation

## 2016-03-01 DIAGNOSIS — Z951 Presence of aortocoronary bypass graft: Secondary | ICD-10-CM | POA: Diagnosis not present

## 2016-03-01 DIAGNOSIS — Z87891 Personal history of nicotine dependence: Secondary | ICD-10-CM | POA: Diagnosis not present

## 2016-03-01 DIAGNOSIS — I25810 Atherosclerosis of coronary artery bypass graft(s) without angina pectoris: Secondary | ICD-10-CM | POA: Diagnosis not present

## 2016-03-01 DIAGNOSIS — R079 Chest pain, unspecified: Secondary | ICD-10-CM | POA: Diagnosis present

## 2016-03-01 DIAGNOSIS — I952 Hypotension due to drugs: Secondary | ICD-10-CM

## 2016-03-01 LAB — BASIC METABOLIC PANEL
Anion gap: 5 (ref 5–15)
BUN: 14 mg/dL (ref 6–20)
CO2: 29 mmol/L (ref 22–32)
Calcium: 9.7 mg/dL (ref 8.9–10.3)
Chloride: 107 mmol/L (ref 101–111)
Creatinine, Ser: 1.02 mg/dL (ref 0.61–1.24)
GFR calc Af Amer: >60 mL/min (ref >60)
GFR calc non Af Amer: >60 mL/min (ref >60)
Glucose, Bld: 86 mg/dL (ref 65–99)
Potassium: 4.3 mmol/L (ref 3.5–5.1)
Sodium: 141 mmol/L (ref 135–145)

## 2016-03-01 LAB — I-STAT TROPONIN, ED
Troponin i, poc: 0.01 ng/mL (ref 0.00–0.08)
Troponin i, poc: 0.01 ng/mL (ref 0.00–0.08)

## 2016-03-01 LAB — CBC
HCT: 44.4 % (ref 39.0–52.0)
Hemoglobin: 13.9 g/dL (ref 13.0–17.0)
MCH: 28.4 pg (ref 26.0–34.0)
MCHC: 31.3 g/dL (ref 30.0–36.0)
MCV: 90.8 fL (ref 78.0–100.0)
Platelets: 197 10*3/uL (ref 150–400)
RBC: 4.89 MIL/uL (ref 4.22–5.81)
RDW: 13.8 % (ref 11.5–15.5)
WBC: 7.3 10*3/uL (ref 4.0–10.5)

## 2016-03-01 MED ORDER — METOPROLOL SUCCINATE ER 25 MG PO TB24: 25.0000 mg | ORAL_TABLET | Freq: Every day | ORAL | Status: DC

## 2016-03-01 NOTE — Discharge Instructions (Signed)
Nonspecific Chest Pain  °Chest pain can be caused by many different conditions. There is always a chance that your pain could be related to something serious, such as a heart attack or a blood clot in your lungs. Chest pain can also be caused by conditions that are not life-threatening. If you have chest pain, it is very important to follow up with your health care provider. °CAUSES  °Chest pain can be caused by: °· Heartburn. °· Pneumonia or bronchitis. °· Anxiety or stress. °· Inflammation around your heart (pericarditis) or lung (pleuritis or pleurisy). °· A blood clot in your lung. °· A collapsed lung (pneumothorax). It can develop suddenly on its own (spontaneous pneumothorax) or from trauma to the chest. °· Shingles infection (varicella-zoster virus). °· Heart attack. °· Damage to the bones, muscles, and cartilage that make up your chest wall. This can include: °¨ Bruised bones due to injury. °¨ Strained muscles or cartilage due to frequent or repeated coughing or overwork. °¨ Fracture to one or more ribs. °¨ Sore cartilage due to inflammation (costochondritis). °RISK FACTORS  °Risk factors for chest pain may include: °· Activities that increase your risk for trauma or injury to your chest. °· Respiratory infections or conditions that cause frequent coughing. °· Medical conditions or overeating that can cause heartburn. °· Heart disease or family history of heart disease. °· Conditions or health behaviors that increase your risk of developing a blood clot. °· Having had chicken pox (varicella zoster). °SIGNS AND SYMPTOMS °Chest pain can feel like: °· Burning or tingling on the surface of your chest or deep in your chest. °· Crushing, pressure, aching, or squeezing pain. °· Dull or sharp pain that is worse when you move, cough, or take a deep breath. °· Pain that is also felt in your back, neck, shoulder, or arm, or pain that spreads to any of these areas. °Your chest pain may come and go, or it may stay  constant. °DIAGNOSIS °Lab tests or other studies may be needed to find the cause of your pain. Your health care provider may have you take a test called an ambulatory ECG (electrocardiogram). An ECG records your heartbeat patterns at the time the test is performed. You may also have other tests, such as: °· Transthoracic echocardiogram (TTE). During echocardiography, sound waves are used to create a picture of all of the heart structures and to look at how blood flows through your heart. °· Transesophageal echocardiogram (TEE). This is a more advanced imaging test that obtains images from inside your body. It allows your health care provider to see your heart in finer detail. °· Cardiac monitoring. This allows your health care provider to monitor your heart rate and rhythm in real time. °· Holter monitor. This is a portable device that records your heartbeat and can help to diagnose abnormal heartbeats. It allows your health care provider to track your heart activity for several days, if needed. °· Stress tests. These can be done through exercise or by taking medicine that makes your heart beat more quickly. °· Blood tests. °· Imaging tests. °TREATMENT  °Your treatment depends on what is causing your chest pain. Treatment may include: °· Medicines. These may include: °¨ Acid blockers for heartburn. °¨ Anti-inflammatory medicine. °¨ Pain medicine for inflammatory conditions. °¨ Antibiotic medicine, if an infection is present. °¨ Medicines to dissolve blood clots. °¨ Medicines to treat coronary artery disease. °· Supportive care for conditions that do not require medicines. This may include: °¨ Resting. °¨ Applying heat   or cold packs to injured areas. °¨ Limiting activities until pain decreases. °HOME CARE INSTRUCTIONS °· If you were prescribed an antibiotic medicine, finish it all even if you start to feel better. °· Avoid any activities that bring on chest pain. °· Do not use any tobacco products, including  cigarettes, chewing tobacco, or electronic cigarettes. If you need help quitting, ask your health care provider. °· Do not drink alcohol. °· Take medicines only as directed by your health care provider. °· Keep all follow-up visits as directed by your health care provider. This is important. This includes any further testing if your chest pain does not go away. °· If heartburn is the cause for your chest pain, you may be told to keep your head raised (elevated) while sleeping. This reduces the chance that acid will go from your stomach into your esophagus. °· Make lifestyle changes as directed by your health care provider. These may include: °¨ Getting regular exercise. Ask your health care provider to suggest some activities that are safe for you. °¨ Eating a heart-healthy diet. A registered dietitian can help you to learn healthy eating options. °¨ Maintaining a healthy weight. °¨ Managing diabetes, if necessary. °¨ Reducing stress. °SEEK MEDICAL CARE IF: °· Your chest pain does not go away after treatment. °· You have a rash with blisters on your chest. °· You have a fever. °SEEK IMMEDIATE MEDICAL CARE IF:  °· Your chest pain is worse. °· You have an increasing cough, or you cough up blood. °· You have severe abdominal pain. °· You have severe weakness. °· You faint. °· You have chills. °· You have sudden, unexplained chest discomfort. °· You have sudden, unexplained discomfort in your arms, back, neck, or jaw. °· You have shortness of breath at any time. °· You suddenly start to sweat, or your skin gets clammy. °· You feel nauseous or you vomit. °· You suddenly feel light-headed or dizzy. °· Your heart begins to beat quickly, or it feels like it is skipping beats. °These symptoms may represent a serious problem that is an emergency. Do not wait to see if the symptoms will go away. Get medical help right away. Call your local emergency services (911 in the [place] Do not drive yourself to the hospital. °  °This  information is not intended to replace advice given to you by your health care provider. Make sure you discuss any questions you have with your health care provider. °  °Document Released: 05/26/2005 Document Revised: 09/06/2014 Document Reviewed: 03/22/2014 °Elsevier Interactive Patient Education ©2016 [employer] ° °

## 2016-03-01 NOTE — ED Provider Notes (Signed)
CSN: [id number]     Arrival date & time 03/01/16  1242 History   First MD Initiated Contact with Patient 03/01/16 1300     Chief Complaint  Patient presents with  . Chest Pain     Patient is a 60 y.o. male presenting with chest pain. The history is provided by the patient.  Chest Pain Associated symptoms: nausea   Associated symptoms: no abdominal pain, no back pain, no headache, no numbness, not vomiting and no weakness   Patient presents with left-sided chest pain. BeganThis morning. States it is dull. No real radiation. Slight nausea. States blood pressures been low. States he's been having difficulty getting control. States been going both high and low. 3 months ago had a 5 vessel CABG. States he is not having pain before that. No fevers or chills. Has had an occasional cough. He states does not feel like his previous GERD. No swelling in his legs. States the pain is somewhat worse with breathing. No change in his level of physical activity.  Past Medical History  Diagnosis Date  . Anxiety   . Esophageal reflux     eosinophil esophagitis  . Acute hepatitis C without mention of hepatic coma     Tx by [facility] for Hep.  negative for hep c 2/15.  . Colitis   . CAD (coronary artery disease)     a. ant STEMI >> LHC with 3 v CAD; oLAD tx with POBA >> emergent CABG  . Ischemic cardiomyopathy     a. EF 25-30% at intraop TEE 4/17  //  b. Limited Echo 5/17 - EF 45-50%, mild ant HK  . Chronic systolic CHF (congestive heart failure) [facility])    Past Surgical History  Procedure Laterality Date  . Shoulder surgery    . Cardiac catheterization N/A 11/28/2015    Procedure: Left Heart Cath and Coronary Angiography;  Surgeon: [name], MD;  Location: [place] [facility];  Service: Cardiovascular;  Laterality: N/A;  . Cardiac catheterization N/A 11/28/2015    Procedure: Coronary Balloon Angioplasty;  Surgeon: [name], MD;  Location: [place] [facility];  Service: Cardiovascular;   Laterality: N/A;  ostial LAD  . Cardiac catheterization N/A 11/28/2015    Procedure: Coronary/Graft Angiography;  Surgeon: [name], MD;  Location: [place] [facility];  Service: Cardiovascular;  Laterality: N/A;  coronaries only   . Coronary artery bypass graft N/A 11/28/2015    Procedure: CORONARY ARTERY BYPASS GRAFTING (CABG) TIMES FIVE USING LEFT INTERNAL MAMMARY ARTERY AND RIGHT GREATER SAPHENOUS,VIEN HARVEATED BY ENDOVIEN, INTRAOPPRATIVE TEE;  Surgeon: Gaye Pollack, MD;  Location: [place];  Service: Open Heart Surgery;  Laterality: N/A;   Family History  Problem Relation Age of Onset  . Autoimmune disease Neg Hx   . Lung cancer Mother   . Heart Problems Father   . Heart attack Maternal Grandmother   . Heart attack Father 66  . Heart attack Paternal Uncle   . Hypertension Brother   . Stroke Maternal Grandmother   . Stroke Father    Social History  Substance Use Topics  . Smoking status: Former Smoker -- 0.50 packs/day    Types: Cigarettes    Quit date: 11/28/2015  . Smokeless tobacco: Never Used  . Alcohol Use: No    Review of Systems  Constitutional: Negative for activity change and appetite change.  Eyes: Negative for pain.  Respiratory: Negative for chest tightness.   Cardiovascular: Positive for chest pain. Negative for leg  swelling.  Gastrointestinal: Positive for nausea. Negative for vomiting, abdominal pain and diarrhea.  Genitourinary: Negative for flank pain.  Musculoskeletal: Negative for back pain and neck stiffness.  Skin: Negative for rash.  Neurological: Negative for weakness, numbness and headaches.  Psychiatric/Behavioral: Negative for behavioral problems.      Allergies  Prednisone; Tetanus toxoids; Wellbutrin; and Chantix  Home Medications   Prior to Admission medications   Medication Sig Start Date End Date Taking? Authorizing Provider  aspirin 81 MG EC tablet Take 1 tablet (81 mg total) by mouth daily. 12/25/15  Yes Scott T Kathlen Mody,  PA-C  atorvastatin (LIPITOR) 40 MG tablet Take 1 tablet (40 mg total) by mouth daily. 02/16/16  Yes [name], MD  chlordiazePOXIDE (LIBRIUM) 25 MG capsule Take 25 mg by mouth daily as needed for anxiety.    Yes Historical Provider, MD  clopidogrel (PLAVIX) 75 MG tablet Take 1 tablet (75 mg total) by mouth daily. 12/25/15 12/24/16 Yes Scott T Weaver, PA-C  lisinopril (PRINIVIL,ZESTRIL) 10 MG tablet Take 10 mg by mouth daily.   Yes Historical Provider, MD  metoprolol (LOPRESSOR) 50 MG tablet Take 0.5 tablets (25 mg total) by mouth 2 (two) times daily. 02/19/16  Yes [name], MD  Multiple Vitamins-Minerals (ONE-A-DAY MENS 50+ ADVANTAGE PO) Take 1 tablet by mouth daily.    Yes Historical Provider, MD  nitroGLYCERIN (NITROSTAT) 0.4 MG SL tablet Place 1 tablet (0.4 mg total) under the tongue every 5 (five) minutes as needed for chest pain. 02/12/16  Yes [name], MD  pantoprazole (PROTONIX) 40 MG tablet Take 40 mg by mouth daily. FOR REFLUX   Yes Historical Provider, MD  ranitidine (ZANTAC) 150 MG tablet Take 150 mg by mouth as needed for heartburn.   Yes Historical Provider, MD  zolpidem (AMBIEN) 10 MG tablet Take 10 mg by mouth at bedtime as needed for sleep. Reported on 02/09/2016 11/26/15  Yes Historical Provider, MD   BP 112/65 mmHg  Pulse 53  Temp(Src) 98.6 F (37 C) (Oral)  Resp 12  Ht '5\' 9"'$  (1.753 m)  Wt 170 lb (77.111 kg)  BMI 25.09 kg/m2  SpO2 100% Physical Exam  Constitutional: He appears well-developed.  HENT:  Head: Atraumatic.  Neck: Neck supple.  Cardiovascular: Normal rate.   Pulmonary/Chest: Effort normal. He exhibits no tenderness.  Scar on chest from previous median sternotomy.  Abdominal: Soft.  Musculoskeletal: Normal range of motion.  Neurological: He is alert.  Skin: Skin is warm.    ED Course  Procedures (including critical care time) Labs Review Labs Reviewed  BASIC METABOLIC PANEL  CBC  I-STAT [place], ED    Imaging  Review Dg Chest 2 View  03/01/2016  CLINICAL DATA:  Mid chest pain, nausea, dizziness and shortness of breath beginning last night. EXAM: CHEST  2 VIEW COMPARISON:  PA and lateral chest 12/31/2015 and 01/03/2015. FINDINGS: The patient is status post CABG. Lungs are clear. Heart size is normal. No pneumothorax or pleural effusion. No focal bony abnormality. IMPRESSION: Negative chest. Electronically Signed   By: Inge Rise M.D.   On: 03/01/2016 13:47   I have personally reviewed and evaluated these images and lab results as part of my medical decision-making.   EKG Interpretation   Date/Time:  Monday March 01 2016 12:49:36 EDT Ventricular Rate:  75 PR Interval:  138 QRS Duration: 86 QT Interval:  352 QTC Calculation: 393 R Axis:   85 Text Interpretation:  Normal sinus rhythm Nonspecific T wave abnormality  Abnormal ECG Confirmed by Alvino Chapel  MD, Ovid Curd [phone]) on 03/01/2016  1:00:41 PM      MDM   Final diagnoses:  Chest pain, unspecified chest pain type    Patient with chest pain. Recent CABG. Feels better after nitroglycerin. EKG reassuring. To be seen by cardiology.    Davonna Belling, MD 03/01/16 [phone]

## 2016-03-01 NOTE — ED Notes (Signed)
I-Stat Troponin being drawn at this time

## 2016-03-01 NOTE — Consult Note (Signed)
Patient ID: Vihan Santagata MRN: [id number], DOB/AGE: Apr 17, 1956   Admit date: 03/01/2016   Primary Physician: [name], MD Primary Cardiologist: Dr. Irish Lack  Pt. Profile:  [name] is a 60 y.o. male with a history of CAD s/p CABG, HCV s/p treatment, prior tobacco abuse, GERD who presented to [facility] ED for evaluation of chest pain and difficulty controlling his blood pressure.    Problem List  Past Medical History  Diagnosis Date  . Anxiety   . Esophageal reflux     eosinophil esophagitis  . Acute hepatitis C without mention of hepatic coma     Tx by [facility] for Hep.  negative for hep c 2/15.  . Colitis   . CAD (coronary artery disease)     a. ant STEMI >> LHC with 3 v CAD; oLAD tx with POBA >> emergent CABG  . Ischemic cardiomyopathy     a. EF 25-30% at intraop TEE 4/17  //  b. Limited Echo 5/17 - EF 45-50%, mild ant HK  . Chronic systolic CHF (congestive heart failure) [facility])     Past Surgical History  Procedure Laterality Date  . Shoulder surgery    . Cardiac catheterization N/A 11/28/2015    Procedure: Left Heart Cath and Coronary Angiography;  Surgeon: [name], MD;  Location: [place] CV LAB;  Service: Cardiovascular;  Laterality: N/A;  . Cardiac catheterization N/A 11/28/2015    Procedure: Coronary Balloon Angioplasty;  Surgeon: [name], MD;  Location: [place] [facility];  Service: Cardiovascular;  Laterality: N/A;  ostial LAD  . Cardiac catheterization N/A 11/28/2015    Procedure: Coronary/Graft Angiography;  Surgeon: [name], MD;  Location: [place] [facility];  Service: Cardiovascular;  Laterality: N/A;  coronaries only   . Coronary artery bypass graft N/A 11/28/2015    Procedure: CORONARY ARTERY BYPASS GRAFTING (CABG) TIMES FIVE USING LEFT INTERNAL MAMMARY ARTERY AND RIGHT GREATER SAPHENOUS,VIEN HARVEATED BY ENDOVIEN, INTRAOPPRATIVE TEE;  Surgeon: Gaye Pollack, MD;  Location: [place];  Service: Open Heart Surgery;   Laterality: N/A;     HPI: As above. Admitted 3/31-4/6 with anterior STEMI. Emergent LHC demonstrated severe 3 v CAD with ostial LAD occlusion. LAD was opened with POBA. IABP was placed and he was seen emergently by Dr. Cyndia Bent. He underwent CABG with L-LAD, S-Dx, S-RI/OM, S-PDA. Post op course was notable for volume overload, anemia (did not require transfusion), thrombocytopenia. Otherwise, post op course was uneventful. No LV gram done at [facility]. Intraop TEE demonstrated EF 25-30% with anterior, ant-septal and apical AK.   EF in May 2017 had improved to 45-50%. He has stopped smoking since the surgery. He still craves cigarettes.  He was doing great on cardiac perspective when seen by Dr. Irish Lack 02/11/16. Recently difficult to control his blood pressure. Running low as well as high. Medication adjusted recently a few times. Seen by pharmacist in hypertension clinic 02/26/16. It was felt that his home cuff overestimating systolic blood pressure by 10-15 points. Advised to continue lisinopril '10mg'$ .   Patient has being having intermittent dizziness since his blood pressure is fluctuating. He has a bad day yesterday. Intermittent dizziness and shortness of breath. He also complains of chest pain for the past few days. Felt could be due to acid reflux. However, this morning he woke up with a chest pain similar to MI with less intensity. Improved with sublingual nitroglycerin x 1 however returned. No radiation of pain but had nausea. Recently developed dyspnea on  exertion. The patient denies lower extremity edema, orthopnea, PND, syncope, melena or blood in his stool or urine. Also complains of lower extremity muscle achiness.  Upon arrival to ED his blood pressure was 95/72. Now improved. Point-of-care troponin negative. Lytes normal. Chest x-ray clear. EKG shows sinus rhythm at rate of 75 bpm, nonspecific T-wave abnormality in lateral lead which appears similar to prior EKG.   Allergies  Allergies    Allergen Reactions  . Prednisone Other (See Comments)    States that this med makes him "crazy"  . Tetanus Toxoids Swelling and Other (See Comments)    Fever, Swelling of the arm   . Wellbutrin [Bupropion] Other (See Comments)    Crazy thoughts, nightmares  . Chantix [Varenicline] Other (See Comments)    Dreams     Home Medications  Prior to Admission medications   Medication Sig Start Date End Date Taking? Authorizing Provider  aspirin 81 MG EC tablet Take 1 tablet (81 mg total) by mouth daily. 12/25/15  Yes Anthony T Kathlen Mody, PA-C  atorvastatin (LIPITOR) 40 MG tablet Take 1 tablet (40 mg total) by mouth daily. 02/16/16  Yes [name], MD  chlordiazePOXIDE (LIBRIUM) 25 MG capsule Take 25 mg by mouth daily as needed for anxiety.    Yes Historical Provider, MD  clopidogrel (PLAVIX) 75 MG tablet Take 1 tablet (75 mg total) by mouth daily. 12/25/15 12/24/16 Yes Anthony T Weaver, PA-C  lisinopril (PRINIVIL,ZESTRIL) 10 MG tablet Take 10 mg by mouth daily.   Yes Historical Provider, MD  metoprolol (LOPRESSOR) 50 MG tablet Take 0.5 tablets (25 mg total) by mouth 2 (two) times daily. 02/19/16  Yes [name], MD  Multiple Vitamins-Minerals (ONE-A-DAY MENS 50+ ADVANTAGE PO) Take 1 tablet by mouth daily.    Yes Historical Provider, MD  nitroGLYCERIN (NITROSTAT) 0.4 MG SL tablet Place 1 tablet (0.4 mg total) under the tongue every 5 (five) minutes as needed for chest pain. 02/12/16  Yes [name], MD  pantoprazole (PROTONIX) 40 MG tablet Take 40 mg by mouth daily. FOR REFLUX   Yes Historical Provider, MD  ranitidine (ZANTAC) 150 MG tablet Take 150 mg by mouth as needed for heartburn.   Yes Historical Provider, MD  zolpidem (AMBIEN) 10 MG tablet Take 10 mg by mouth at bedtime as needed for sleep. Reported on 02/09/2016 11/26/15  Yes Historical Provider, MD    Family History  Family History  Problem Relation Age of Onset  . Autoimmune disease Neg Hx   . Lung cancer Mother   .  Heart Problems Father   . Heart attack Maternal Grandmother   . Heart attack Father 27  . Heart attack Paternal Uncle   . Hypertension Brother   . Stroke Maternal Grandmother   . Stroke Father    Family Status  Relation Status Death Age  . Mother Deceased 40  . Father Deceased 16    Social History  Social History   Social History  . Marital Status: Married    Spouse Name: [name]  . Number of Children: 3  . Years of Education: College   Occupational History  . Self-employed    Social History Main Topics  . Smoking status: Former Smoker -- 0.50 packs/day    Types: Cigarettes    Quit date: 11/28/2015  . Smokeless tobacco: Never Used  . Alcohol Use: No  . Drug Use: No  . Sexual Activity: Not on file   Other Topics Concern  . Not on file  Social History Narrative   Patient lives at home with his spouse.   Caffeine Use: yes      All other systems reviewed and are otherwise negative except as noted above.  Physical Exam  Blood pressure 112/65, pulse 53, temperature 98.6 F (37 C), temperature source Oral, resp. rate 12, height '5\' 9"'$  (1.753 m), weight 170 lb (77.111 kg), SpO2 100 %.  General: Pleasant, NAD Psych: Normal affect. Neuro: Alert and oriented X 3. Moves all extremities spontaneously. HEENT: Normal  Neck: Supple without bruits or JVD. Lungs:  Resp regular and unlabored, CTA. TTP at chest.  Heart: RRR no s3, s4, or murmurs. Abdomen: Soft, non-tender, non-distended, BS + x 4.  Extremities: No clubbing, cyanosis or edema. DP/PT/Radials 2+ and equal bilaterally.  Labs  No results for input(s): CKTOTAL, CKMB, TROPONINI in the last 72 hours. Lab Results  Component Value Date   WBC 7.3 03/01/2016   HGB 13.9 03/01/2016   HCT 44.4 03/01/2016   MCV 90.8 03/01/2016   PLT 197 03/01/2016    Recent Labs Lab 03/01/16 1253  NA 141  K 4.3  CL 107  CO2 29  BUN 14  CREATININE 1.02  CALCIUM 9.7  GLUCOSE 86   Lab Results  Component Value Date   CHOL  165 02/12/2016   HDL 49 02/12/2016   LDLCALC 88 02/12/2016   TRIG 142 02/12/2016   No results found for: DDIMER   Radiology/Studies  Dg Chest 2 View  03/01/2016  CLINICAL DATA:  Mid chest pain, nausea, dizziness and shortness of breath beginning last night. EXAM: CHEST  2 VIEW COMPARISON:  PA and lateral chest 12/31/2015 and 01/03/2015. FINDINGS: The patient is status post CABG. Lungs are clear. Heart size is normal. No pneumothorax or pleural effusion. No focal bony abnormality. IMPRESSION: Negative chest. Electronically Signed   By: Inge Rise M.D.   On: 03/01/2016 13:47    ECG  EKG shows sinus rhythm at rate of 75 bpm, nonspecific T-wave abnormality in lateral lead which appears similar to prior EKG. Vent. rate 75 BPM PR interval 138 ms QRS duration 86 ms QT/QTc 352/393 ms P-R-T axes 70 85 91  Echo 12/30/15 LV EF: 45% - 50%  ------------------------------------------------------------------- Indications: (I25.5). Limited study.  ------------------------------------------------------------------- History: PMH: S/p CABG 11/28/15. Acquired from the patient and from the patient&'s chart. Coronary artery disease. Congestive heart failure. Risk factors: Current tobacco use.  ------------------------------------------------------------------- Study Conclusions  - HPI and indications: Limited study. - Left ventricle: The cavity size was normal. Wall thickness was  normal. Systolic function was mildly reduced. The estimated  ejection fraction was in the range of 45% to 50%. Mild anterior  hypokinesis and incoordinate septal motion. The study is not  technically sufficient to allow evaluation of LV diastolic  function.  Impressions:  - Limited study. Compared to the most recent echo, the LVEF has  improved to 45-50% with residual anterior hypokinesis and  incoordinate septal motion.  ASSESSMENT AND PLAN  1. Chest pain - Has both typical and  atypical features. Etiology could be due to GERD (some times felt typical acid reflux s/s) versus musculoskeletal (reproducible with palpation) versus cardiac (improved with SL nitro x 1 however reoccurred). Point-of-care troponin negative. EKG without acute changes--> actually seems resolving TWI in lateral leads compared to prior EKG.  2. CAD s/p CABG x 4   3. Ischemic cardiomyopathy/ Chronic systolic CHF -  EF in May 2017 had improved to 45-50% compared to 25-30% intraop TEE. Euvolemic on exam.  Continue ASA, plavix, BB, statin and ACE.   4. HLD - 02/12/2016: Cholesterol 165; HDL 49; LDL Cholesterol 88; Triglycerides 142; VLDL 28. Zocor changed to lipitor '40mg'$  02/11/16. Complains of leg cramps. Follow closely.   5. HTN - Recently difficult to control. Seen by pharmacist in hypertension clinic 02/26/16. It was felt that his home cuff overestimating systolic blood pressure by 10-15 points. Hypotensive on presentation 95/72. Currently improved on 112/65 without intervention. Check orthostatic.    6. GERD - Avoid Nexium. Continue Protonix and Zantac.  Dispo: Stop metoprolol '25mg'$  BID, start toprol XL '25mg'$ . Rate was in 40s on telemetry. Try Tylenol for pain. Participate in cardiac rehab. F/u in clinic in few weeks.   Signed, Leanor Kail, PA-C 03/01/2016, 2:48 PM Pager [phone] Patient seen and examined and history reviewed. Agree with above findings and plan. 60 yo WM s/p STEMI and emergent CABG in March. Since then EF improved to 45-50%. Doing well at Cardiac Rehab. Fluctuating BP but BP monitor at home not accurate. Today presents with chest pain. Localized to lower sternum. Slight radiation to left. No SOB, N/V, or diaphoresis. Notes BP still low and feels fatigued and in a fog. Dizzy. Notes sometimes HR drops into 40s He is an anxious male in NAD Lungs clear. No gallop or murmur. No rub. Abdomen is benign No edema.  Ecg shows improved T wave changes from prior. Troponin is normal. CXR  is normal.   Impression 1. Atypical chest pain. No evidence of ischemia. Everything points toward successful revascularization with improved EF, Ecg, and symptoms. Recommend Tylenol prn. OK to DC home from ED. 2. Low BP associated with symptoms. Will reduce metoprolol to 25 mg daily and change to XL formulation. If symptoms persist may need to reduce lisinopril. Need to continue Cardiac Rehab as this will allow [name] to accurately follow his HR and BP.   Melik Blancett Martin[place], [place] 03/01/2016 3:50 PM

## 2016-03-01 NOTE — ED Notes (Signed)
Patient here with intermittent chest heaving since 0530. States he was already awake this am when the pain started, took 1 sl NTG with some relief. Nausea with same. Had CABG 3 months ago

## 2016-03-01 NOTE — Telephone Encounter (Signed)
Received call from patient.Stated he woke up this morning with chest pain,rates pain # 4.Stated he has been waking up with heart burn,but this pain is different.Also having dizziness and nausea.Advised to go to ER.

## 2016-03-01 NOTE — ED Notes (Signed)
Patient became dizzy while transferring from chair to wheelchair at triage

## 2016-03-01 NOTE — ED Provider Notes (Signed)
Cleared for discharge by Dr. Martinique, cardiology. Medication changes made. Patient's vital signs remained stable. Repeat troponin is unchanged. Return precautions given.  Julianne Rice, MD 03/01/16 1755

## 2016-03-01 NOTE — Telephone Encounter (Signed)
New message      Pt c/o of Chest Pain: STAT if CP now or developed within 24 hours  1. Are you having CP right now?yes 2. Are you experiencing any other symptoms (ex. SOB, nausea, vomiting, sweating)?  Lightheaded and slight nausea/sob  3. How long have you been experiencing CP? Off and on all morning 4. Is your CP continuous or coming and going? Comes and goes 5. Have you taken Nitroglycerin? no ?

## 2016-03-03 ENCOUNTER — Encounter (HOSPITAL_COMMUNITY)
Admission: RE | Admit: 2016-03-03 | Discharge: 2016-03-03 | Disposition: A | Discharge status: Home / Self Care | Payer: BC Managed Care – PPO | Source: Ambulatory Visit | Attending: Interventional Cardiology | Admitting: Interventional Cardiology

## 2016-03-03 DIAGNOSIS — Z7982 Long term (current) use of aspirin: Secondary | ICD-10-CM | POA: Insufficient documentation

## 2016-03-03 DIAGNOSIS — I213 ST elevation (STEMI) myocardial infarction of unspecified site: Secondary | ICD-10-CM | POA: Diagnosis present

## 2016-03-03 DIAGNOSIS — Z7902 Long term (current) use of antithrombotics/antiplatelets: Secondary | ICD-10-CM | POA: Diagnosis not present

## 2016-03-03 DIAGNOSIS — Z951 Presence of aortocoronary bypass graft: Secondary | ICD-10-CM | POA: Insufficient documentation

## 2016-03-03 DIAGNOSIS — Z79899 Other long term (current) drug therapy: Secondary | ICD-10-CM | POA: Insufficient documentation

## 2016-03-03 NOTE — Progress Notes (Signed)
Reviewed home exercise guidelines with patient including endpoints, temperature precautions, target heart rate and rate of perceived exertion. Pt is currently walking 20 minutes 3x's /day on 2 to 3 days outside of cardiac rehab as his mode of home exercise. Pt voices understanding of instructions given. Sol Passer, MS, ACSM CCEP

## 2016-03-03 NOTE — Progress Notes (Signed)
Merritt returned to exercise at cardiac rehab per Dr Martinique. Vital signs stable. Max blood pressure 158/86 on the airdyne. No complaints during exercise. Exit blood pressure 115/81 heart rate 84. Will continue to monitor the patient throughout  the program.

## 2016-03-05 ENCOUNTER — Encounter (HOSPITAL_COMMUNITY)
Admission: RE | Admit: 2016-03-05 | Discharge: 2016-03-05 | Disposition: A | Discharge status: Home / Self Care | Payer: BC Managed Care – PPO | Source: Ambulatory Visit | Attending: Interventional Cardiology | Admitting: Interventional Cardiology

## 2016-03-05 DIAGNOSIS — I213 ST elevation (STEMI) myocardial infarction of unspecified site: Secondary | ICD-10-CM

## 2016-03-05 DIAGNOSIS — Z951 Presence of aortocoronary bypass graft: Secondary | ICD-10-CM

## 2016-03-05 NOTE — Progress Notes (Signed)
Daily Session Note  Patient Details  Name: [name] MRN: [id number] Date of Birth: 1955/12/02 Referring Provider:        CARDIAC REHAB PHASE II ORIENTATION from 02/03/2016 in [place]   Referring Provider  Larae Grooms MD      Encounter Date: 03/05/2016  Check In:     Session Check In - 03/05/16 1440    Check-In   Location [facility]   Staff Present Seward Carol, MS, ACSM CEP, Exercise Physiologist;Darrell Leonhardt, RN, BSN;Amber Fair, MS, ACSM RCEP, Exercise Physiologist   Supervising physician immediately available to respond to emergencies [facility] immediately available   Physician(s) Dr. Marthenia Rolling   Medication changes reported     No   Fall or balance concerns reported    No   Warm-up and Cool-down Performed as group-led instruction   Resistance Training Performed Yes   VAD Patient? No   Pain Assessment   Currently in Pain? No/denies   Multiple Pain Sites No      Capillary Blood Glucose: No results found for this or any previous visit (from the past 24 hour(s)).   Goals Met:  Exercise tolerated well  Goals Unmet:  HR  Comments: pt tachycardic-sinus tach, HR-102,  at rest and with exercise (highest HR-147) .BP:  98/68-128/78 Asymptomatic. Pt reports recent medication change of stopping metoprolol 9m BID and starting toprol XL 221mdaily as prescribed by Dr. JoMartinique Workloads decreased due to HR.  Pt tolerated exercise without difficulty, although pt exhibits frustration and anxiety with necessity to decrease workloads due to high HR.  Pt educated about medication changes and HR response.  Will continue to monitor.   Dr. TrFransico Hims Medical Director for Cardiac Rehab at [facility]

## 2016-03-08 ENCOUNTER — Encounter (HOSPITAL_COMMUNITY)
Admission: RE | Admit: 2016-03-08 | Discharge: 2016-03-08 | Disposition: A | Discharge status: Home / Self Care | Payer: BC Managed Care – PPO | Source: Ambulatory Visit | Attending: Interventional Cardiology | Admitting: Interventional Cardiology

## 2016-03-08 DIAGNOSIS — Z951 Presence of aortocoronary bypass graft: Secondary | ICD-10-CM | POA: Diagnosis not present

## 2016-03-10 ENCOUNTER — Encounter (HOSPITAL_COMMUNITY)
Admission: RE | Admit: 2016-03-10 | Discharge: 2016-03-10 | Disposition: A | Discharge status: Home / Self Care | Payer: BC Managed Care – PPO | Source: Ambulatory Visit | Attending: Interventional Cardiology | Admitting: Interventional Cardiology

## 2016-03-10 DIAGNOSIS — Z951 Presence of aortocoronary bypass graft: Secondary | ICD-10-CM | POA: Diagnosis not present

## 2016-03-10 DIAGNOSIS — I213 ST elevation (STEMI) myocardial infarction of unspecified site: Secondary | ICD-10-CM

## 2016-03-10 NOTE — Progress Notes (Signed)
QUALITY OF LIFE SCORE REVIEW  Pt completed Quality of Life survey as a participant in Cardiac Rehab. Scores 21.0 or below are considered low. Pt score very low in several areas Overall 20.75, Health and Function 16.93, socioeconomic 21.57, physiological and spiritual 22.36, family 28.80. Patient quality of life slightly altered by physical constraints which limits ability to perform as prior to recent cardiac illness. Sharlet Salina says he feels moderately dissatisfied with his health since his MI and OHS .Benjamin reports that he has been feeling "mentally foggy' in the morning the past few weeks. Will notify Dr Irish Lack of Mr Wittmann's concerns,  Offered emotional support and reassurance.  Will continue to monitor and intervene as necessary.  Pearly has been exceeding his target heart rate of 128 will check with Dr Irish Lack to see if Mr Maahs target heart rate can be increased.Will fax exercise flow sheets to Dr. Hebert Soho office for review. Masami has been meeting with the hospital chaplain weekly and has found this to be very helpful. Will continue to monitor the patient throughout  the program.

## 2016-03-11 IMAGING — CR DG CHEST 2V
2 series · 2 of 2 positions shown · non-contrast
Comparison: Chest radiograph performed 12/26/2013

CLINICAL DATA: Chest pain.

EXAM:
CHEST  2 VIEW

[w chest pa]
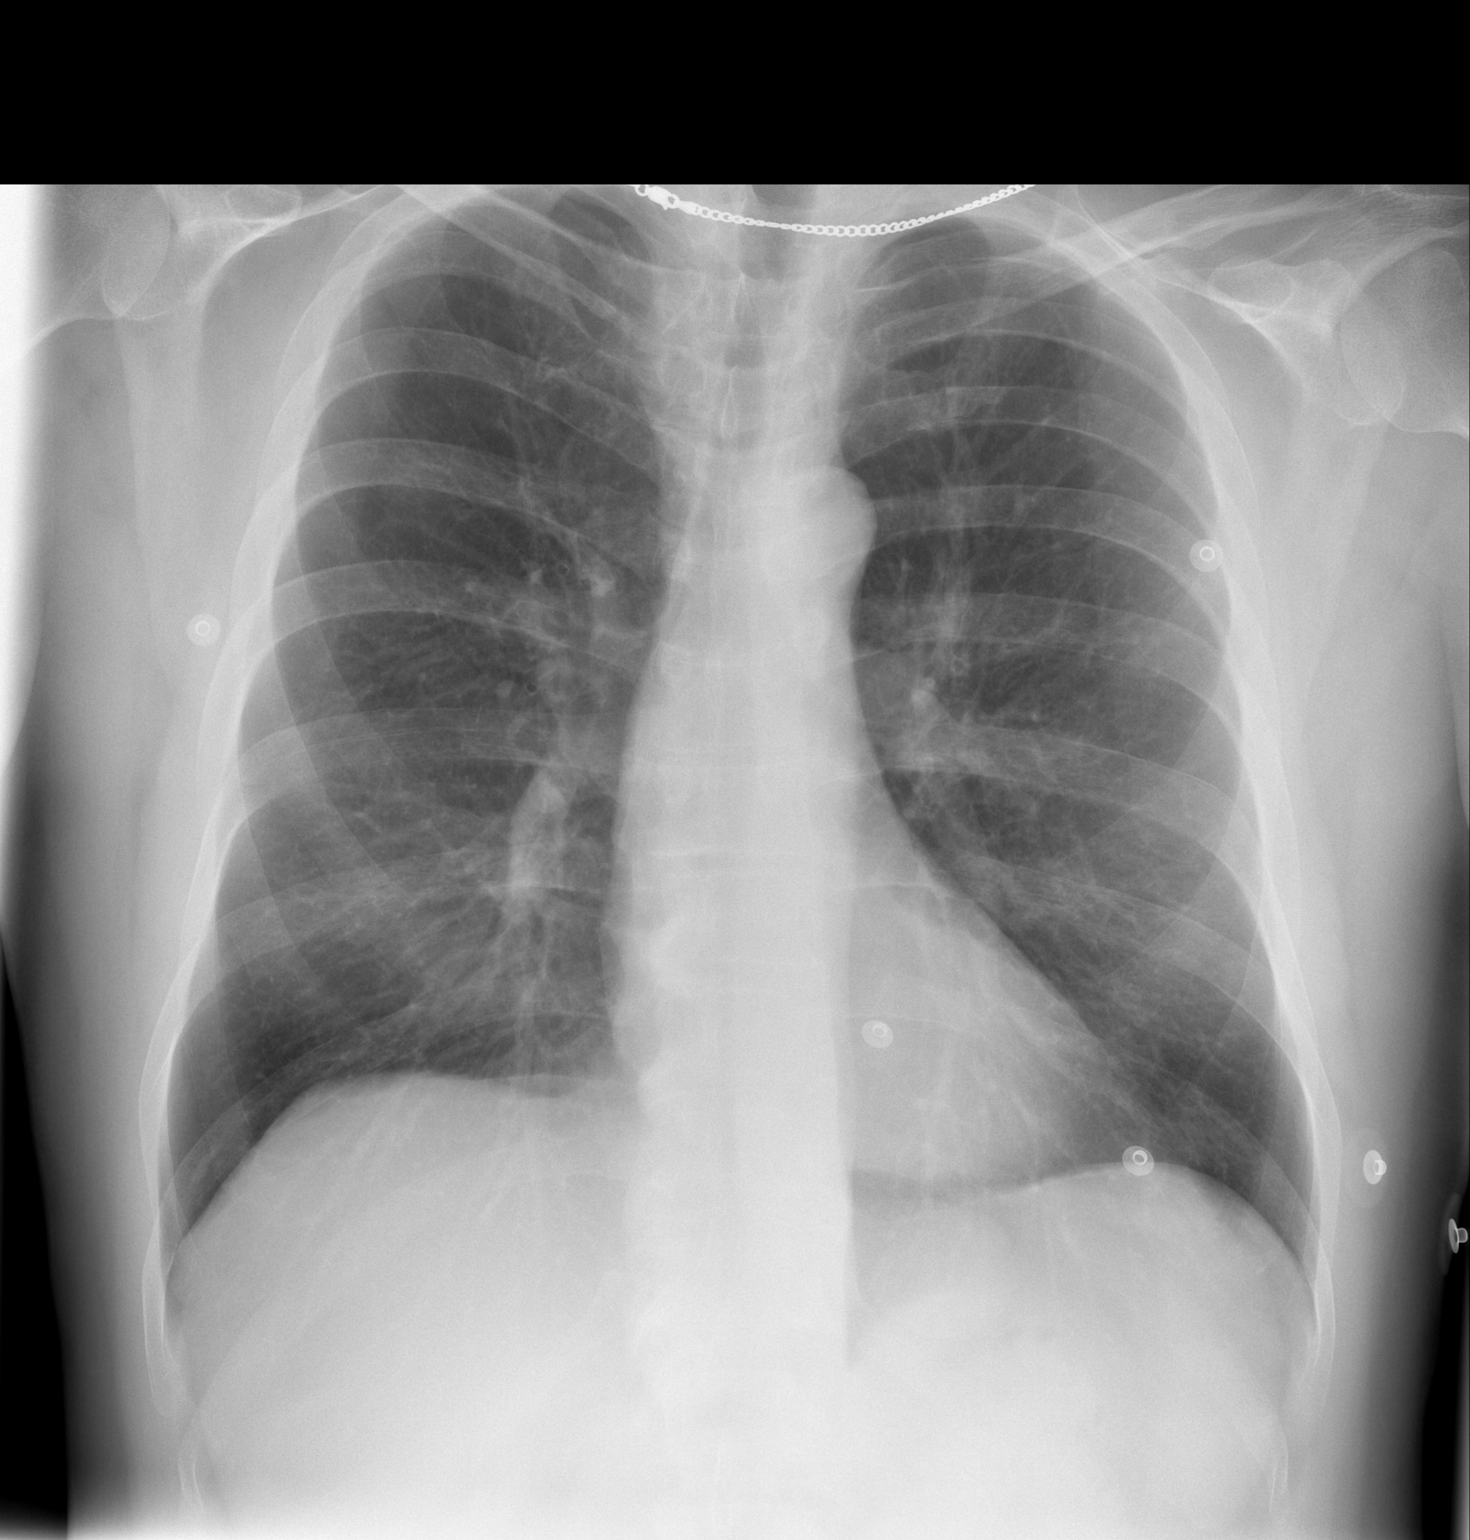

[w chest lat]
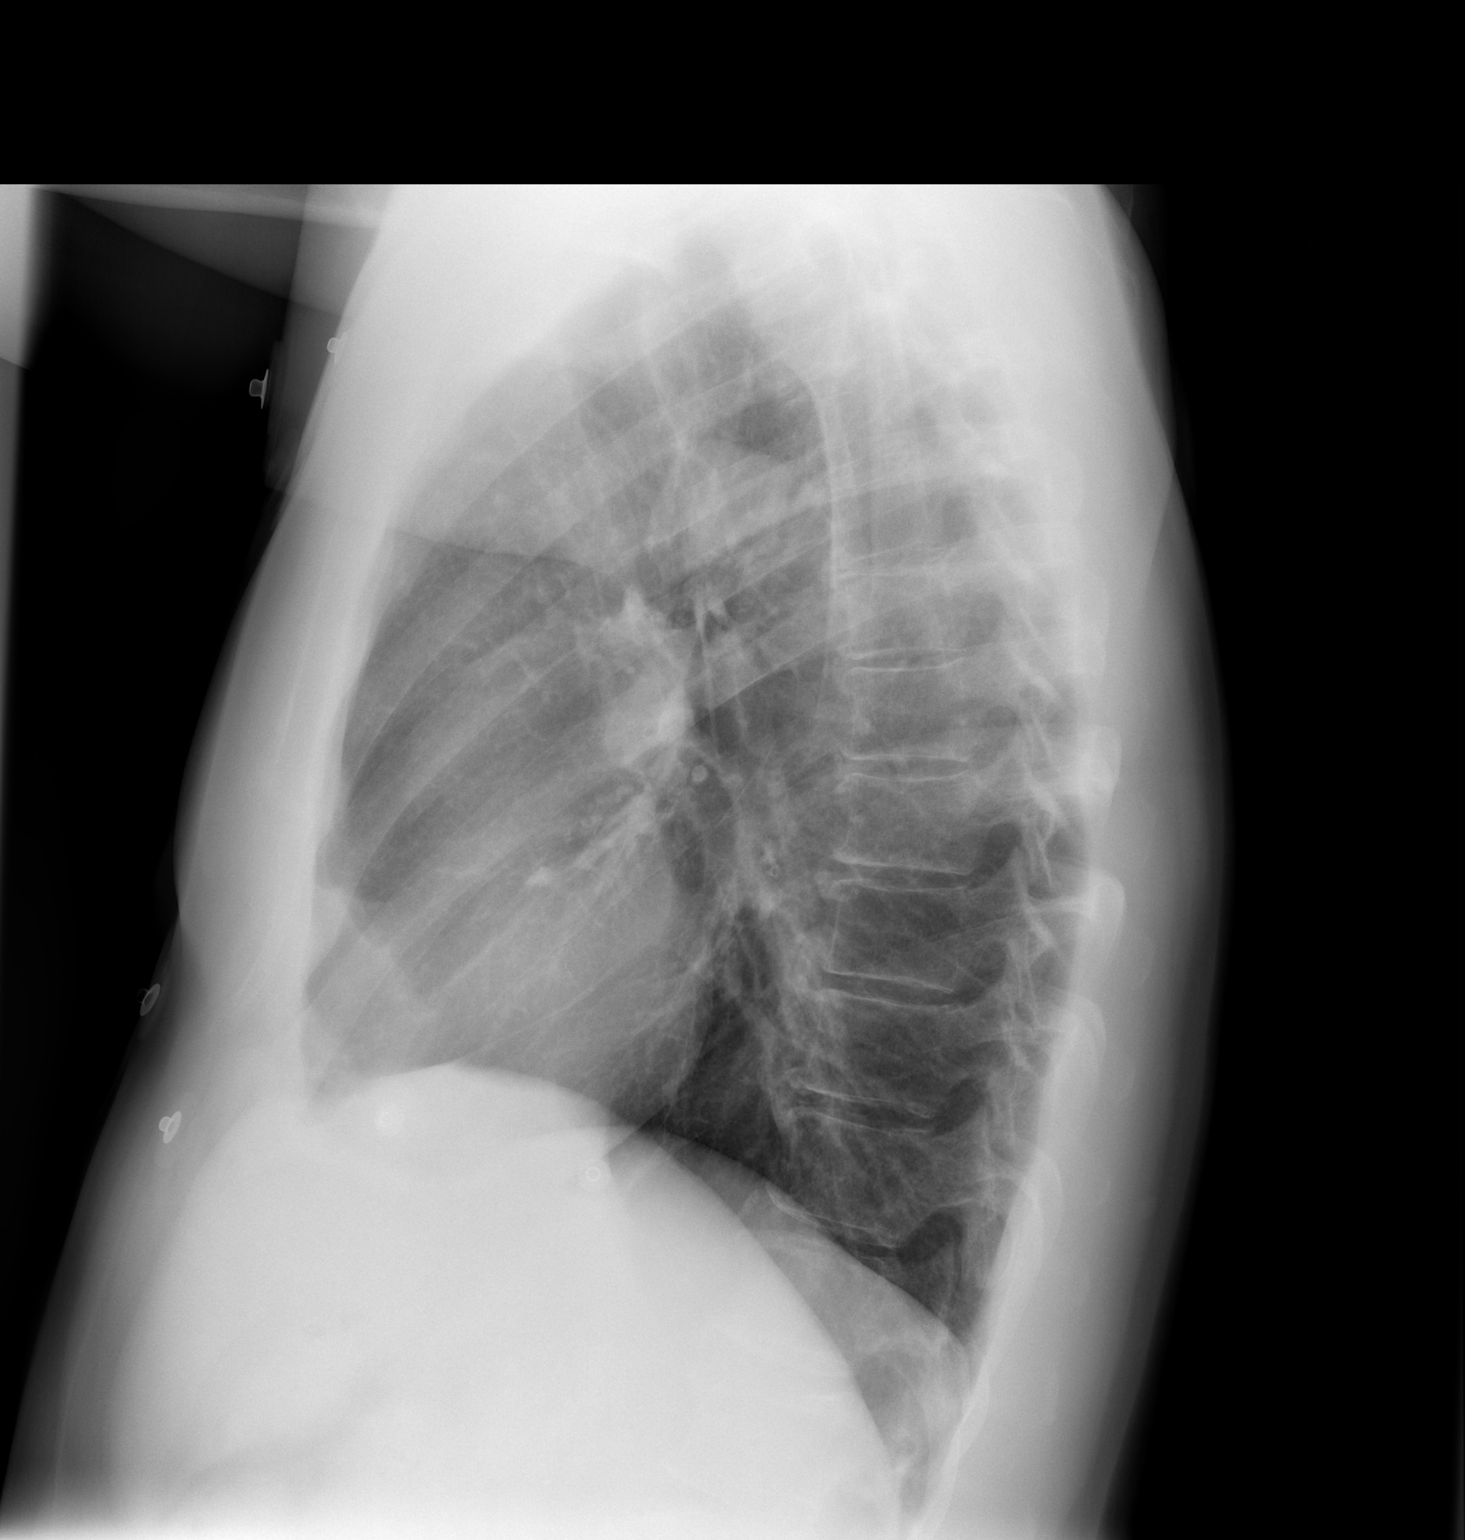

[2 of 2 positions shown; findings below may reference images not displayed]

FINDINGS: The lungs are well-aerated and clear. There is no evidence of focal
opacification, pleural effusion or pneumothorax.

The heart is normal in size; the mediastinal contour is within
normal limits. No acute osseous abnormalities are seen.
IMPRESSION: No acute cardiopulmonary process seen.

## 2016-03-12 ENCOUNTER — Telehealth: Payer: Self-pay | Admitting: Interventional Cardiology

## 2016-03-12 ENCOUNTER — Encounter (HOSPITAL_COMMUNITY): Payer: BC Managed Care – PPO

## 2016-03-12 NOTE — Telephone Encounter (Signed)
New Message:   Pt says he is getting a little depressed,wonder if some of his medicine could be causing this.The last few days he have had headaches and extremely tired.

## 2016-03-12 NOTE — Telephone Encounter (Signed)
**Note De-Identified  Obfuscation** The pt states that he is depressed and wants to know if his medications is causing this. He is advised to contact his PCP for his depression and that his medications will be reviewed at his f/u scheduled with Melina Copa, PA-c on 7/20. He verbalized understanding and thanked me for calling him back.

## 2016-03-15 ENCOUNTER — Encounter (HOSPITAL_COMMUNITY): Payer: BC Managed Care – PPO

## 2016-03-15 ENCOUNTER — Telehealth (HOSPITAL_COMMUNITY): Payer: Self-pay | Admitting: Family Medicine

## 2016-03-15 ENCOUNTER — Telehealth: Payer: Self-pay | Admitting: Interventional Cardiology

## 2016-03-15 NOTE — Telephone Encounter (Signed)
Pt states gradually over the last couple of weeks he has felt foggy and depressed and is asking if this may be side effect of medication. Pt states his BP has been okay or high although he is not sure his BP machine is accurate. Pt checked his pulse while I was on the telephone with him and it was 59. Pt advised I will forward to Dr Irish Lack for review.  Pt advised/enouraged to keep appt at Cardiac Rehab this afternoon, pt states he has been talking with a counselor there , pt advised pulse and BP will be checked there. Pt advised to contact his PCP to discuss depression and options for treatment.   Pt advised I will forward to Dr Irish Lack for review.

## 2016-03-15 NOTE — Telephone Encounter (Signed)
New message      Pt feels like he is "sinking into a deep depression".  Could it be his medications?  Should he call his PCP?  Please advise

## 2016-03-15 NOTE — Telephone Encounter (Signed)
Metoprolol is the only med that could be contributing to depression.  OK to stop metoprolol at this time.

## 2016-03-16 ENCOUNTER — Encounter: Payer: Self-pay | Admitting: Physician Assistant

## 2016-03-16 NOTE — Progress Notes (Addendum)
Cardiology Office Note    Date:  03/18/2016  ID:  [name], DOB 10/12/1955, MRN [id number] PCP:  [name], MD  Cardiologist: Dr. Irish Lack   Chief Complaint: f/u hypotension  History of Present Illness:  [name] is a 60 y.o. male with history of CAD (STEMI 10/2015 s/p POBA of LAD with IABP and subsequent emergent CABG), HCV s/p treatment, prior tobacco abuse, GERD, anxiety, chronic systolic CHF (most recent EF 45-50%), tobacco abuse who presents for f/u. He was previously seen in the pharmacist-driven HTN clinic for BP med adjustment at which time it was noted his home cuff was overestimating systolic blood pressure by 10-15 points. Last echo 12/2015 to re-eval EF showed improvement to 45-50%. He was recently seen in the ER due to BP fluctuation, dizziness, chest discomfort, LE achiness, and SOB. Initial BP was 95/72 which improved. Labs were unrevealing, troponin was negative, and CXR was normal. HR was intermittently in the 40s. His metoprolol was reduced to avoid symptomatic hypotension with recommendation to consider reduction in lisinopril if symptoms persisted. Chest pain was felt atypical and Tylenol was recommended.  He returns for follow-up today. Overall he is feeling better, but still notes some occasional fatigue and mental fogginess. No recurrent CP or dyspnea reported. He spoke with his psychiatrist who plans to call in a medication for depression as he has been feeling somewhat depressed. [name] reports prior h/o situational depression preceding this. No SI/HI. He reports being tried on a different statin in the past which did not change mental fogginess. He describes it as an inability to concentrate sometimes - no specific neuro deficits though. No slurred speech or aphasia reported.    Past Medical History  Diagnosis Date  . Anxiety   . Esophageal reflux     eosinophil esophagitis  . Acute hepatitis C without mention of hepatic coma     Tx by [facility] for  Hep.  negative for hep c 2/15.  . Colitis   . CAD (coronary artery disease)     a. 10/2015 ant STEMI >> LHC with 3 v CAD; oLAD tx with POBA >> emergent CABG.  . Ischemic cardiomyopathy     a. EF 25-30% at intraop TEE 4/17  //  b. Limited Echo 5/17 - EF 45-50%, mild ant HK  . Chronic systolic CHF (congestive heart failure) ([place])   . Former tobacco use   . Sinus bradycardia     a. HR dropping into 40s in 02/2016 -> BB reduced.  . Symptomatic hypotension     a. 02/2016 ER visit -> meds reduced.    Past Surgical History  Procedure Laterality Date  . Shoulder surgery    . Cardiac catheterization N/A 11/28/2015    Procedure: Left Heart Cath and Coronary Angiography;  Surgeon: [name], MD;  Location: [facility];  Service: Cardiovascular;  Laterality: N/A;  . Cardiac catheterization N/A 11/28/2015    Procedure: Coronary Balloon Angioplasty;  Surgeon: [name], MD;  Location: [facility];  Service: Cardiovascular;  Laterality: N/A;  ostial LAD  . Cardiac catheterization N/A 11/28/2015    Procedure: Coronary/Graft Angiography;  Surgeon: [name], MD;  Location: [facility];  Service: Cardiovascular;  Laterality: N/A;  coronaries only   . Coronary artery bypass graft N/A 11/28/2015    Procedure: CORONARY ARTERY BYPASS GRAFTING (CABG) TIMES FIVE USING LEFT INTERNAL MAMMARY ARTERY AND RIGHT GREATER SAPHENOUS,VIEN HARVEATED BY ENDOVIEN, INTRAOPPRATIVE TEE;  Surgeon: Gaye Pollack,  MD;  Location: [facility];  Service: Open Heart Surgery;  Laterality: N/A;    Current Medications: Current Outpatient Prescriptions  Medication Sig Dispense Refill  . aspirin 81 MG EC tablet Take 1 tablet (81 mg total) by mouth daily.    Marland Kitchen atorvastatin (LIPITOR) 40 MG tablet Take 1 tablet (40 mg total) by mouth daily. 30 tablet 6  . chlordiazePOXIDE (LIBRIUM) 25 MG capsule Take 25 mg by mouth daily as needed for anxiety.     . clopidogrel (PLAVIX) 75 MG tablet Take 1 tablet  (75 mg total) by mouth daily. 30 tablet 11  . lisinopril (PRINIVIL,ZESTRIL) 10 MG tablet Take 10 mg by mouth daily.    . Multiple Vitamins-Minerals (ONE-A-DAY MENS 50+ ADVANTAGE PO) Take 1 tablet by mouth daily.     . nitroGLYCERIN (NITROSTAT) 0.4 MG SL tablet Place 1 tablet (0.4 mg total) under the tongue every 5 (five) minutes as needed for chest pain. 25 tablet 3  . pantoprazole (PROTONIX) 40 MG tablet Take 40 mg by mouth daily as needed (FOR REFLUX). FOR REFLUX    . ranitidine (ZANTAC) 150 MG tablet Take 150 mg by mouth as needed for heartburn.    . zolpidem (AMBIEN) 10 MG tablet Take 10 mg by mouth at bedtime as needed for sleep. Reported on 02/09/2016    . [DISCONTINUED] metoprolol (LOPRESSOR) 50 MG tablet Take 0.5 tablets (25 mg total) by mouth 2 (two) times daily. 30 tablet 6   No current facility-administered medications for this visit.     Allergies:   Prednisone; Tetanus toxoids; Wellbutrin; and Chantix   Social History   Social History  . Marital Status: Married    Spouse Name: [name]  . Number of Children: 3  . Years of Education: College   Occupational History  . Self-employed    Social History Main Topics  . Smoking status: Former Smoker -- 0.50 packs/day    Types: Cigarettes    Quit date: 11/28/2015  . Smokeless tobacco: Never Used  . Alcohol Use: No  . Drug Use: No  . Sexual Activity: Not Asked   Other Topics Concern  . None   Social History Narrative   Patient lives at home with his spouse.   Caffeine Use: yes     Family History:  The patient's family history includes Heart Problems in his father; Heart attack in his maternal grandmother and paternal uncle; Heart attack (age of onset: 35) in his father; Hypertension in his brother; Lung cancer in his mother; Stroke in his father and maternal grandmother. There is no history of Autoimmune disease.   ROS:   Please see the history of present illness. No syncope. All other systems are reviewed and otherwise  negative.    PHYSICAL EXAM:   VS:  BP 104/76 mmHg  Pulse 74  Ht '5\' 9"'$  (1.753 m)  Wt 173 lb 9.6 oz (78.744 kg)  BMI 25.62 kg/m2  SpO2 98%  BMI: Body mass index is 25.62 kg/(m^2). GEN: Well nourished, well developed WM, in no acute distress HEENT: normocephalic, atraumatic Neck: no JVD, carotid bruits, or masses Cardiac: RRR; no murmurs, rubs, or gallops, no edema  Respiratory:  clear to auscultation bilaterally, normal work of breathing GI: soft, nontender, nondistended, + BS MS: no deformity or atrophy Skin: warm and dry, no rash Neuro:  Alert and Oriented x 3, Strength and sensation are intact, follows commands Psych: euthymic mood, full affect  Wt Readings from Last 3 Encounters:  03/18/16 173 lb 9.6 oz (  78.744 kg)  03/01/16 170 lb (77.111 kg)  02/12/16 173 lb (78.472 kg)      Studies/Labs Reviewed:   EKG:   EKG was not ordered today.  Recent Labs: 11/29/2015: Magnesium 2.5* 02/12/2016: ALT 18 03/01/2016: BUN 14; Creatinine, Ser 1.02; Hemoglobin 13.9; Platelets 197; Potassium 4.3; Sodium 141   Lipid Panel    Component Value Date/Time   CHOL 165 02/12/2016 1038   TRIG 142 02/12/2016 1038   HDL 49 02/12/2016 1038   CHOLHDL 3.4 02/12/2016 1038   VLDL 28 02/12/2016 1038   LDLCALC 88 02/12/2016 1038    Additional studies/ records that were reviewed today include: Summarized above.    ASSESSMENT & PLAN:   1. Symptomatic hypotension with fatigue - BP on standing was 98/72. BP recheck by me sitting was 112/72. Difficult to know if fatigue and mental fogginess are coming from hypotension. We discussed various options of how to proceed. At this time, will reduce lisinopril further to '5mg'$  daily. If his BP comes back to normal and symptoms do not improve, then we know they are not related to blood pressure. He has f/u with his PCP this week and I asked him to discuss his symptoms further at that visit - may need recheck of thyroid function, vitamin deficiency levels,  testosterone, etc. He is also reporting symptoms of depression as well which could be contributing - his psychiatrist is going to be managing an antidepressant. Above all if he ultimately sees no relief from the above workup, could try statin holiday as some patients anecdotally have reported the mental fogginess while taking this. However, I do not want to make too many changes at this time because then we won't really know what helped. We also discussed getting a new BP cuff for home - his is totally inaccurate. I've recommended the omron brand based on colleague recommendations. 2. Sinus bradycardia - remains off BB at this time. HR was in the 40s in the ER per report on low dose metoprolol. HR in the 70s today. 3. CAD as above - continue ASA, BB, statin, Plavix. No recurrent CP reported. 4. Chronic systolic CHF - no symptoms reported. Off BB due to #2. Monitor.  Disposition: F/u with Dr. Irish Lack in 3 months.   Medication Adjustments/Labs and Tests Ordered: Current medicines are reviewed at length with the patient today.  Concerns regarding medicines are outlined above. Medication changes, Labs and Tests ordered today are summarized above and listed in the Patient Instructions accessible in Encounters.   Raechel Ache PA-C  03/18/2016 10:18 AM    [address], [place], [place]  [address] Phone: [phone]; Fax: [phone]

## 2016-03-17 ENCOUNTER — Encounter (HOSPITAL_COMMUNITY): Payer: BC Managed Care – PPO

## 2016-03-17 NOTE — Addendum Note (Signed)
**Note De-Identified Luria Rosario Obfuscation** Addended by: Dennie Fetters on: 03/17/2016 12:04 PM   Modules accepted: Orders, Medications

## 2016-03-17 NOTE — Telephone Encounter (Signed)
**Note De-Identified  Obfuscation** The pt is advised and he verbalized understanding and is in agreement with plan. He will stop taking Metoprolol today and begin checking his BPs daily, keep diary of BPs and HRs and call me if his BP becomes consistantly elevated.  Metoprolol has been removed from his med list. Also, the pt is again advised to contact his PCP concerning his memory issues and depression. He states that he will contact his PCPs office today to schedule an appt. He will be seen in f/u with Melina Copa, PA-c tomorrow and he is aware of that appt.

## 2016-03-18 ENCOUNTER — Encounter (INDEPENDENT_AMBULATORY_CARE_PROVIDER_SITE_OTHER): Payer: Self-pay

## 2016-03-18 ENCOUNTER — Encounter: Payer: Self-pay | Admitting: Physician Assistant

## 2016-03-18 ENCOUNTER — Ambulatory Visit (INDEPENDENT_AMBULATORY_CARE_PROVIDER_SITE_OTHER): Payer: BC Managed Care – PPO | Admitting: Physician Assistant

## 2016-03-18 VITALS — BP 104/76 | HR 74 | Ht 69.0 in | Wt 173.6 lb

## 2016-03-18 DIAGNOSIS — I959 Hypotension, unspecified: Secondary | ICD-10-CM

## 2016-03-18 DIAGNOSIS — I2510 Atherosclerotic heart disease of native coronary artery without angina pectoris: Secondary | ICD-10-CM | POA: Diagnosis not present

## 2016-03-18 DIAGNOSIS — I9589 Other hypotension: Secondary | ICD-10-CM | POA: Diagnosis not present

## 2016-03-18 DIAGNOSIS — R001 Bradycardia, unspecified: Secondary | ICD-10-CM | POA: Diagnosis not present

## 2016-03-18 DIAGNOSIS — I5022 Chronic systolic (congestive) heart failure: Secondary | ICD-10-CM

## 2016-03-18 DIAGNOSIS — Z9861 Coronary angioplasty status: Secondary | ICD-10-CM

## 2016-03-18 DIAGNOSIS — R5383 Other fatigue: Secondary | ICD-10-CM

## 2016-03-18 MED ORDER — NITROGLYCERIN 0.4 MG SL SUBL: 0.4000 mg | SUBLINGUAL_TABLET | SUBLINGUAL | Status: DC | PRN

## 2016-03-18 MED ORDER — LISINOPRIL 5 MG PO TABS: 5.0000 mg | ORAL_TABLET | Freq: Every day | ORAL | Status: DC

## 2016-03-18 NOTE — Patient Instructions (Addendum)
Medication Instructions:  Your physician has recommended you make the following change in your medication:  1.  DECREASE the Lisinopril to 5 mg taking 1 tablet daily  Labwork: None ordered  Testing/Procedures: None ordered  Follow-Up: Your physician wants you to follow-up in: 3 MONTHS WITH DR. VARANASI.  You will receive a reminder letter in the mail two months in advance. If you don't receive a letter, please call our office to schedule the follow-up appointment.    Any Other Special Instructions Will Be Listed Below (If Applicable). Try the Omron Blood Pressure Cuff.  Call our office if it is running >130 on top and < 100 on the bottom.    If you need a refill on your cardiac medications before your next appointment, please call your pharmacy.

## 2016-03-18 NOTE — Progress Notes (Signed)
Cardiac Individual Treatment Plan  Patient Details  Name: [name] MRN: [id number] Date of Birth: Sep 24, 1955 Referring Provider:        CARDIAC REHAB PHASE II ORIENTATION from 02/03/2016 in [place]   Referring Provider  Larae Grooms MD      Initial Encounter Date:       CARDIAC REHAB PHASE II ORIENTATION from 02/03/2016 in [place]   Date  02/03/16   Referring Provider  Larae Grooms MD      Visit Diagnosis: ST elevation (STEMI) myocardial infarction of unspecified site ([place])  S/P CABG x 5  Patient's Home Medications on Admission:  Current outpatient prescriptions:  .  aspirin 81 MG EC tablet, Take 1 tablet (81 mg total) by mouth daily., Disp: , Rfl:  .  atorvastatin (LIPITOR) 40 MG tablet, Take 1 tablet (40 mg total) by mouth daily., Disp: 30 tablet, Rfl: 6 .  chlordiazePOXIDE (LIBRIUM) 25 MG capsule, Take 25 mg by mouth daily as needed for anxiety. , Disp: , Rfl:  .  clopidogrel (PLAVIX) 75 MG tablet, Take 1 tablet (75 mg total) by mouth daily., Disp: 30 tablet, Rfl: 11 .  Multiple Vitamins-Minerals (ONE-A-DAY MENS 50+ ADVANTAGE PO), Take 1 tablet by mouth daily. , Disp: , Rfl:  .  pantoprazole (PROTONIX) 40 MG tablet, Take 40 mg by mouth daily as needed (FOR REFLUX). FOR REFLUX, Disp: , Rfl:  .  ranitidine (ZANTAC) 150 MG tablet, Take 150 mg by mouth as needed for heartburn., Disp: , Rfl:  .  zolpidem (AMBIEN) 10 MG tablet, Take 10 mg by mouth at bedtime as needed for sleep. Reported on 02/09/2016, Disp: , Rfl:  .  lisinopril (PRINIVIL,ZESTRIL) 5 MG tablet, Take 1 tablet (5 mg total) by mouth daily., Disp: 90 tablet, Rfl: 3 .  nitroGLYCERIN (NITROSTAT) 0.4 MG SL tablet, Place 1 tablet (0.4 mg total) under the tongue every 5 (five) minutes as needed for chest pain., Disp: 25 tablet, Rfl: 3 .  [DISCONTINUED] metoprolol (LOPRESSOR) 50 MG tablet, Take 0.5 tablets (25 mg total) by mouth 2 (two) times  daily., Disp: 30 tablet, Rfl: 6  Past Medical History: Past Medical History  Diagnosis Date  . Anxiety   . Esophageal reflux     eosinophil esophagitis  . Acute hepatitis C without mention of hepatic coma     Tx by [facility] for Hep.  negative for hep c 2/15.  . Colitis   . CAD (coronary artery disease)     a. 10/2015 ant STEMI >> LHC with 3 v CAD; oLAD tx with POBA >> emergent CABG.  . Ischemic cardiomyopathy     a. EF 25-30% at intraop TEE 4/17  //  b. Limited Echo 5/17 - EF 45-50%, mild ant HK  . Chronic systolic CHF (congestive heart failure) ([place])   . Former tobacco use   . Sinus bradycardia     a. HR dropping into 40s in 02/2016 -> BB reduced.  . Symptomatic hypotension     a. 02/2016 ER visit -> meds reduced.    Tobacco Use: History  Smoking status  . Former Smoker -- 0.50 packs/day  . Types: Cigarettes  . Quit date: 11/28/2015  Smokeless tobacco  . Never Used    Labs:     Recent Review Flowsheet Data    Labs for ITP Cardiac and Pulmonary Rehab Latest Ref Rng 11/29/2015 11/29/2015 11/29/2015 02/12/2016 02/12/2016   Cholestrol 125 - 200 mg/dL - - -  164 165   LDLCALC <130 mg/dL - - - 88 88   HDL >=40 mg/dL - - - 47 49   Trlycerides <150 mg/dL - - - 143 142   PHART 7.350 - 7.450 - 7.415 - - -   PCO2ART 35.0 - 45.0 mmHg - 43.1 - - -   HCO3 20.0 - 24.0 mEq/L - 27.7(H) - - -   TCO2 0 - 100 mmol/L '24 29 23 '$ - -   O2SAT - - 98.0 - - -      Capillary Blood Glucose: Lab Results  Component Value Date   GLUCAP 104* 11/30/2015   GLUCAP 106* 11/30/2015   GLUCAP 93 11/29/2015   GLUCAP 129* 11/29/2015   GLUCAP 98 11/29/2015     Exercise Target Goals:    Exercise Program Goal: Individual exercise prescription set with THRR, safety & activity barriers. Participant demonstrates ability to understand and report RPE using BORG scale, to self-measure pulse accurately, and to acknowledge the importance of the exercise prescription.  Exercise Prescription Goal: Starting with aerobic  activity 30 plus minutes a day, 3 days per week for initial exercise prescription. Provide home exercise prescription and guidelines that participant acknowledges understanding prior to discharge.  Activity Barriers & Risk Stratification:     Activity Barriers & Cardiac Risk Stratification - 02/03/16 1418    Activity Barriers & Cardiac Risk Stratification   Activity Barriers Other (comment)   Comments Rare gout. Occassionaly fluid on rt knee.   Cardiac Risk Stratification High      6 Minute Walk:     6 Minute Walk      02/03/16 1629       6 Minute Walk   Phase Initial     Distance 1589 feet     Walk Time 6 minutes     # of Rest Breaks 0     MPH 3.01     METS 3.89     RPE 11     VO2 Peak 13.61     Symptoms Yes (comment)     Comments Lightheaded     Resting HR 55 bpm     Resting BP 111/72 mmHg     Max Ex. HR 105 bpm     Max Ex. BP 94/60 mmHg     2 Minute Post BP 102/64 mmHg        Initial Exercise Prescription:     Initial Exercise Prescription - 02/03/16 1600    Date of Initial Exercise RX and Referring Provider   Date 02/03/16   Referring Provider Larae Grooms MD   Treadmill   MPH 2   Grade 1   Minutes 10   METs 2.81   Bike   Level 1   Minutes 10   METs 3.44   NuStep   Level 2   Minutes 10   METs 2.5   Prescription Details   Frequency (times per week) 3   Duration Progress to 30 minutes of continuous aerobic without signs/symptoms of physical distress   Intensity   THRR 40-80% of Max Heartrate 64-128 bpm   Ratings of Perceived Exertion 11-13   Perceived Dyspnea 0-4   Progression   Progression Continue to progress workloads to maintain intensity without signs/symptoms of physical distress.   Resistance Training   Training Prescription Yes   Weight 2 lbs   Reps 10-12      Perform Capillary Blood Glucose checks as needed.  Exercise Prescription Changes:      Exercise Prescription  Changes      02/27/16 1500 03/03/16 1700 03/08/16  1700       Exercise Review   Progression Yes Yes Yes     Response to Exercise   Blood Pressure (Admit) 128/70 mmHg 108/78 mmHg 98/68 mmHg     Blood Pressure (Exercise) 142/80 mmHg 158/86 mmHg 128/78 mmHg     Blood Pressure (Exit) 102/72 mmHg 115/81 mmHg 100/62 mmHg     Heart Rate (Admit) 84 bpm 83 bpm 102 bpm     Heart Rate (Exercise) 142 bpm 149 bpm 147 bpm     Heart Rate (Exit) 82 bpm 84 bpm 105 bpm     Rating of Perceived Exertion (Exercise) '11 13 9     '$ Symptoms none none none     Comments  Reviewed home exercise guidelines on 03/03/16. Reviewed home exercise guidelines on 03/03/16.     Duration Progress to 30 minutes of continuous aerobic without signs/symptoms of physical distress Progress to 30 minutes of continuous aerobic without signs/symptoms of physical distress Progress to 30 minutes of continuous aerobic without signs/symptoms of physical distress     Intensity THRR unchanged THRR unchanged THRR unchanged     Progression   Progression Continue to progress workloads to maintain intensity without signs/symptoms of physical distress. Continue to progress workloads to maintain intensity without signs/symptoms of physical distress. Continue to progress workloads to maintain intensity without signs/symptoms of physical distress.     Average METs 3.5 3.7 3.9     Resistance Training   Training Prescription Yes Yes Yes     Weight 2 lbs 2 lbs 2 lbs     Reps 10-12 10-12 10-12     Interval Training   Interval Training No No No     Treadmill   MPH -- 2 2.1     Grade -- 1 2     Minutes -- 10 10     METs -- 2.81 3.19     Bike   Level '2 2 2     '$ Minutes '10 10 10     '$ METs 5.74 5.83 5.83     NuStep   Level '2 2 3     '$ Minutes '10 10 10     '$ METs 2.4 2.4 2.9     Track   Laps 8 --      Minutes 10 --      METs 2.39 --      Home Exercise Plan   Plans to continue exercise at   3 additional days to program exercise sessions. Add 3 additional days to program  exercise sessions.        Exercise Comments:      Exercise Comments      02/27/16 1534 03/03/16 1658 03/08/16 1717       Exercise Comments Returned to exercise and tolerated well, without c/o. Reviewed home exercise with patient. Pt currently walking 20 minutes 3x's/day 2 to 4 days outside of cardiac rehab.  Patient anxious to increase workloads. Progressing well thus far with exercise.        Discharge Exercise Prescription (Final Exercise Prescription Changes):     Exercise Prescription Changes - 03/08/16 1700    Exercise Review   Progression Yes   Response to Exercise   Blood Pressure (Admit) 98/68 mmHg   Blood Pressure (Exercise) 128/78 mmHg   Blood Pressure (Exit) 100/62 mmHg   Heart Rate (Admit) 102 bpm   Heart Rate (Exercise) 147  bpm   Heart Rate (Exit) 105 bpm   Rating of Perceived Exertion (Exercise) 9   Symptoms none   Comments Reviewed home exercise guidelines on 03/03/16.   Duration Progress to 30 minutes of continuous aerobic without signs/symptoms of physical distress   Intensity THRR unchanged   Progression   Progression Continue to progress workloads to maintain intensity without signs/symptoms of physical distress.   Average METs 3.9   Resistance Training   Training Prescription Yes   Weight 2 lbs   Reps 10-12   Interval Training   Interval Training No   Treadmill   MPH 2.1   Grade 2   Minutes 10   METs 3.19   Bike   Level 2   Minutes 10   METs 5.83   NuStep   Level 3   Minutes 10   METs 2.9   Home Exercise Plan   Plans to continue exercise at Home   Frequency Add 3 additional days to program exercise sessions.      Nutrition:  Target Goals: Understanding of nutrition guidelines, daily intake of sodium '1500mg'$ , cholesterol '200mg'$ , calories 30% from fat and 7% or less from saturated fats, daily to have 5 or more servings of fruits and vegetables.  Biometrics:     Pre Biometrics - 02/03/16 1627    Pre Biometrics   Height 5' 9.25"  (1.759 m)   Weight 172 lb 2.9 oz (78.1 kg)   Waist Circumference 34.25 inches   Hip Circumference 39.25 inches   Waist to Hip Ratio 0.87 %   BMI (Calculated) 25.3   Triceps Skinfold 15 mm   % Body Fat 23.5 %   Grip Strength 37.5 kg   Flexibility 15 in   Single Leg Stand 25 seconds       Nutrition Therapy Plan and Nutrition Goals:     Nutrition Therapy & Goals - 02/05/16 0957    Nutrition Therapy   Diet Therapeutic Lifestyle Changes   Personal Nutrition Goals   Personal Goal #1 Maintain wt around 172 lb while in [place], educate and counsel regarding individualized specific dietary modifications aiming towards targeted core components such as weight, hypertension, lipid management, diabetes, heart failure and other comorbidities.   Expected Outcomes Short Term Goal: Understand basic principles of dietary content, such as calories, fat, sodium, cholesterol and nutrients.;Long Term Goal: Adherence to prescribed nutrition plan.      Nutrition Discharge: Nutrition Scores:     Nutrition Assessments - 02/05/16 0959    MEDFICTS Scores   Pre Score 36      Nutrition Goals Re-Evaluation:   Psychosocial: Target Goals: Acknowledge presence or absence of depression, maximize coping skills, provide positive support system. Participant is able to verbalize types and ability to use techniques and skills needed for reducing stress and depression.  Initial Review & Psychosocial Screening:     Initial Psych Review & Screening - 02/05/16 [place]? Yes   Barriers   Psychosocial barriers to participate in program There are no identifiable barriers or psychosocial needs.   Screening Interventions   Interventions Encouraged to exercise      Quality of Life Scores:     Quality of Life - 03/18/16 1348    Quality of Life Scores   Psych/Spiritual Pre --  Eusevio has been meeting with the hospital  chaplain weekly and has found this to be helpful.   GLOBAL  Pre --  Quality of life reviewed with Mr Deridder and forwarded to Dr Hassell Done office for review.       PHQ-9:     Recent Review Flowsheet Data    Depression screen [facility] 2/9 02/09/2016   Decreased Interest 2   Down, Depressed, Hopeless 2   PHQ - 2 Score 4   Altered sleeping 2   Tired, decreased energy 0   Change in appetite 0   Feeling bad or failure about yourself  0   Trouble concentrating 0   Moving slowly or fidgety/restless 0   Suicidal thoughts 0   PHQ-9 Score 6   Difficult doing work/chores Somewhat difficult      Psychosocial Evaluation and Intervention:   Psychosocial Re-Evaluation:     Psychosocial Re-Evaluation      03/18/16 1350           Psychosocial Re-Evaluation   Interventions Physician referral;Stress management education       Comments --  Cristofer has been meeting with the hospital chaplain for  support       Continued Psychosocial Services Needed Yes          Vocational Rehabilitation: Provide vocational rehab assistance to qualifying candidates.   Vocational Rehab Evaluation & Intervention:     Vocational Rehab - 02/05/16 1013    Initial Vocational Rehab Evaluation & Intervention   Assessment shows need for Vocational Rehabilitation No      Education: Education Goals: Education classes will be provided on a weekly basis, covering required topics. Participant will state understanding/return demonstration of topics presented.  Learning Barriers/Preferences:     Learning Barriers/Preferences - 02/05/16 1007    Learning Barriers/Preferences   Learning Barriers --  wears glasses   Learning Preferences Written Material;Audio      Education Topics: Count Your Pulse:  -Group instruction provided by verbal instruction, demonstration, patient participation and written materials to support subject.  Instructors address importance of being able to find your pulse and how to count  your pulse when at home without a heart monitor.  Patients get hands on experience counting their pulse with staff help and individually.      CARDIAC REHAB PHASE II EXERCISE from 03/10/2016 in [place]   Date  03/05/16   Instruction Review Code  2- meets goals/outcomes      Heart Attack, Angina, and Risk Factor Modification:  -Group instruction provided by verbal instruction, video, and written materials to support subject.  Instructors address signs and symptoms of angina and heart attacks.    Also discuss risk factors for heart disease and how to make changes to improve heart health risk factors.   Functional Fitness:  -Group instruction provided by verbal instruction, demonstration, patient participation, and written materials to support subject.  Instructors address safety measures for doing things around the house.  Discuss how to get up and down off the floor, how to pick things up properly, how to safely get out of a chair without assistance, and balance training.   Meditation and Mindfulness:  -Group instruction provided by verbal instruction, patient participation, and written materials to support subject.  Instructor addresses importance of mindfulness and meditation practice to help reduce stress and improve awareness.  Instructor also leads participants through a meditation exercise.       CARDIAC REHAB PHASE II EXERCISE from 03/10/2016 in [place]   Date  03/03/16   Educator  Jeanella Craze Chaplain   Instruction Review  Code  2- meets goals/outcomes      Stretching for Flexibility and Mobility:  -Group instruction provided by verbal instruction, patient participation, and written materials to support subject.  Instructors lead participants through series of stretches that are designed to increase flexibility thus improving mobility.  These stretches are additional exercise for major muscle groups that are typically  performed during regular warm up and cool down.   Hands Only CPR Anytime:  -Group instruction provided by verbal instruction, video, patient participation and written materials to support subject.  Instructors co-teach with AHA video for hands only CPR.  Participants get hands on experience with mannequins.   Nutrition I class: Heart Healthy Eating:  -Group instruction provided by PowerPoint slides, verbal discussion, and written materials to support subject matter. The instructor gives an explanation and review of the Therapeutic Lifestyle Changes diet recommendations, which includes a discussion on lipid goals, dietary fat, sodium, fiber, plant stanol/sterol esters, sugar, and the components of a well-balanced, healthy diet.   Nutrition II class: Lifestyle Skills:  -Group instruction provided by PowerPoint slides, verbal discussion, and written materials to support subject matter. The instructor gives an explanation and review of label reading, grocery shopping for heart health, heart healthy recipe modifications, and ways to make healthier choices when eating out.   Diabetes Question & Answer:  -Group instruction provided by PowerPoint slides, verbal discussion, and written materials to support subject matter. The instructor gives an explanation and review of diabetes co-morbidities, pre- and post-prandial blood glucose goals, pre-exercise blood glucose goals, signs, symptoms, and treatment of hypoglycemia and hyperglycemia, and foot care basics.   Diabetes Blitz:  -Group instruction provided by PowerPoint slides, verbal discussion, and written materials to support subject matter. The instructor gives an explanation and review of the physiology behind type 1 and type 2 diabetes, diabetes medications and rational behind using different medications, pre- and post-prandial blood glucose recommendations and Hemoglobin A1c goals, diabetes diet, and exercise including blood glucose guidelines for  exercising safely.    Portion Distortion:  -Group instruction provided by PowerPoint slides, verbal discussion, written materials, and food models to support subject matter. The instructor gives an explanation of serving size versus portion size, changes in portions sizes over the last 20 years, and what consists of a serving from each food group.      CARDIAC REHAB PHASE II EXERCISE from 03/10/2016 in [place]   Date  02/25/16   Educator  RD   Instruction Review Code  2- meets goals/outcomes      Stress Management:  -Group instruction provided by verbal instruction, video, and written materials to support subject matter.  Instructors review role of stress in heart disease and how to cope with stress positively.     Exercising on Your Own:  -Group instruction provided by verbal instruction, power point, and written materials to support subject.  Instructors discuss benefits of exercise, components of exercise, frequency and intensity of exercise, and end points for exercise.  Also discuss use of nitroglycerin and activating EMS.  Review options of places to exercise outside of rehab.  Review guidelines for sex with heart disease.   Cardiac Drugs I:  -Group instruction provided by verbal instruction and written materials to support subject.  Instructor reviews cardiac drug classes: antiplatelets, anticoagulants, beta blockers, and statins.  Instructor discusses reasons, side effects, and lifestyle considerations for each drug class.   Cardiac Drugs II:  -Group instruction provided by verbal instruction and written materials to support  subject.  Instructor reviews cardiac drug classes: angiotensin converting enzyme inhibitors (ACE-I), angiotensin II receptor blockers (ARBs), nitrates, and calcium channel blockers.  Instructor discusses reasons, side effects, and lifestyle considerations for each drug class.          CARDIAC REHAB PHASE II EXERCISE from  03/10/2016 in [place]   Date  03/10/16   Instruction Review Code  2- meets goals/outcomes      Anatomy and Physiology of the Circulatory System:  -Group instruction provided by verbal instruction, video, and written materials to support subject.  Reviews functional anatomy of heart, how it relates to various diagnoses, and what role the heart plays in the overall system.   Knowledge Questionnaire Score:     Knowledge Questionnaire Score - 02/03/16 1629    Knowledge Questionnaire Score   Pre Score 20/24      Core Components/Risk Factors/Patient Goals at Admission:     Personal Goals and Risk Factors at Admission - 02/03/16 1648    Core Components/Risk Factors/Patient Goals on Admission   Tobacco Cessation Yes   Number of packs per day <1   Intervention Assist the participant in steps to quit. Provide individualized education and counseling about committing to Tobacco Cessation, relapse prevention, and pharmacological support that can be provided by physician.   Expected Outcomes Short Term: Will quit all tobacco product use, adhering to prevention of relapse plan.;Long Term: Complete abstinence from all tobacco products for at least 12 months from quit date.  Patient quit 11/28/15 and is currently abstaining.   Stress Yes   Intervention Offer individual and/or small group education and counseling on adjustment to heart disease, stress management and health-related lifestyle change. Teach and support self-help strategies.;Refer participants experiencing significant psychosocial distress to appropriate mental health specialists for further evaluation and treatment. When possible, include family members and significant others in education/counseling sessions.   Expected Outcomes Short Term: Participant demonstrates changes in health-related behavior, relaxation and other stress management skills, ability to obtain effective social support, and compliance  with psychotropic medications if prescribed.;Long Term: Emotional wellbeing is indicated by absence of clinically significant psychosocial distress or social isolation.   Personal Goal Other Yes   Personal Goal Regain energy. Start regular exercise prorgam. Participate in mission trips and help with church activities.   Intervention Provide individualized exercise program to improve cardiorespiratory fitness.   Expected Outcomes Energy and stamina imrpvovement as fitness level increases. Able to participate in more activites without feeling tired.      Core Components/Risk Factors/Patient Goals Review:      Goals and Risk Factor Review      03/08/16 1725           Core Components/Risk Factors/Patient Goals Review   Personal Goals Review Stress;Other       Review Patient has seen Jeanella Craze from [place] services in consultation. Pt hasn't regained energy yet, but is is exercising 5 days/week.       Expected Outcomes Maintain walking program at home.           Core Components/Risk Factors/Patient Goals at Discharge (Final Review):      Goals and Risk Factor Review - 03/08/16 1725    Core Components/Risk Factors/Patient Goals Review   Personal Goals Review Stress;Other   Review Patient has seen Jeanella Craze from [place] [facility] in consultation. Pt hasn't regained energy yet, but is is exercising 5 days/week.   Expected Outcomes Maintain walking program at home.  ITP Comments:     ITP Comments      02/03/16 1444           ITP Comments Dr. Fransico Him, Medical Director           Comments: Pt is making expected progress toward personal goals after completing 8 sessions. Recommend continued exercise and life style modification education including  stress management and relaxation techniques to decrease cardiac risk profile. Mouhamed last exercise day was 03/10/2016. Wilkie has been meeting with the hospital chaplain weekly.Barnet Pall, RN,BSN 03/18/2016  2:30 PM

## 2016-03-19 ENCOUNTER — Encounter (HOSPITAL_COMMUNITY): Payer: BC Managed Care – PPO

## 2016-03-19 ENCOUNTER — Telehealth (HOSPITAL_COMMUNITY): Payer: Self-pay | Admitting: Family Medicine

## 2016-03-22 ENCOUNTER — Encounter (HOSPITAL_COMMUNITY): Payer: BC Managed Care – PPO

## 2016-03-24 ENCOUNTER — Encounter (HOSPITAL_COMMUNITY): Payer: BC Managed Care – PPO

## 2016-03-24 ENCOUNTER — Telehealth: Payer: Self-pay | Admitting: Interventional Cardiology

## 2016-03-24 NOTE — Telephone Encounter (Signed)
Pt states that today he has a HA and no energy.  Pt states he does not feel like this is his heart, but more probably his depression.  Vitals the last couple of days have been 115-130/70-82, HR 70's.  Pt started antidepressant about one week ago.  Denies fever, CP, SOB, lightheadedness, dizziness, or visual disturbances.  Pt has appt with PCP tomorrow.  Pt was concerned about possibly getting kicked out of cardiac rehab.  Advised pt if feeling better to attend on Friday.  Advised pt to keep PCP appt tomorrow and let us know if there is anything our office can do for him.  Advised pt if any new symptoms developed, like CP, SOB, sweating, etc, to please go to ER and get checked out.  Pt verbalized understanding and was appreciative for assistance.

## 2016-03-24 NOTE — Telephone Encounter (Signed)
New message   Pt said his attendance is not good in therapy but its because he has not been feeling too good he said it feels like he has had the flu and he sees his PCP tomorrow and he has also been put on antidepressants   He said it is not heart related

## 2016-03-26 ENCOUNTER — Encounter (HOSPITAL_COMMUNITY)
Admission: RE | Admit: 2016-03-26 | Payer: BC Managed Care – PPO | Source: Ambulatory Visit | Attending: Interventional Cardiology | Admitting: Interventional Cardiology

## 2016-03-29 ENCOUNTER — Telehealth: Payer: Self-pay | Admitting: Interventional Cardiology

## 2016-03-29 ENCOUNTER — Encounter (HOSPITAL_COMMUNITY): Payer: BC Managed Care – PPO

## 2016-03-29 ENCOUNTER — Telehealth (HOSPITAL_COMMUNITY): Payer: Self-pay | Admitting: *Deleted

## 2016-03-29 NOTE — Telephone Encounter (Signed)
New Message:    Please call,he wants to talk to you about Cardiac Rehab.

## 2016-03-29 NOTE — Telephone Encounter (Signed)
Spoke with pt and he states that he is concerned about being removed from the [facility] program because he keeps missing days due to work or feeling bad because of his depression.  Advised pt that unfortunately I am not familiar with their attendance policy and what the parameters are for getting removed from the program, if there are any.  Provided pt with [facility]'s phone number and advised him to call them and find out where he stands.  Pt very appreciative and agrees to call [facility] for more information.

## 2016-03-29 NOTE — Telephone Encounter (Signed)
Returned call from pt.  Pt worried that he may be dropped from the rehab program due to non attendance.  Reassured pt that his absences were all valid.  Pt unable to come today due to work schedule and he feels stressed by this.  Advised pt to strive to attend 2 x week to lessen the stress he feels trying to get here.  Pt reports new anti depressant medication was added two weeks ago.  Pt feels he is able to get out of bed where as before he could not. Much encouragement and support given to pt. Pt plans to attend on Wednesday. Cherre Huger, BSN

## 2016-03-31 ENCOUNTER — Telehealth: Payer: Self-pay | Admitting: Interventional Cardiology

## 2016-03-31 ENCOUNTER — Encounter (HOSPITAL_COMMUNITY)
Admission: RE | Admit: 2016-03-31 | Payer: BC Managed Care – PPO | Source: Ambulatory Visit | Attending: Interventional Cardiology | Admitting: Interventional Cardiology

## 2016-03-31 ENCOUNTER — Encounter: Payer: Self-pay | Admitting: Interventional Cardiology

## 2016-03-31 NOTE — Telephone Encounter (Signed)
**Note De-Identified  Obfuscation** The pt states that he missed [facility] app today so he decided to exercise at home. He states that he walked for 1/2 hr and developed some CP, SOB and dizziness. He reports that his BP was 130/80 shortly after walk and that his HR was 85 bpm. He states that he had no nausea or radiation of pain during or after walk. He states that he did not try any NTG because he didn't think about having it until he called [name].  After talking to him for about 2 mins the pt stated that his CP was gone and that he was longer SOB (Pt did not sound SOB on the phone).  I advised him to take NTG as directed for CP up to 3 times with 5 mins in between each dose and that if his CP does not resolve to call 911 or have someone drive him to the closest ER. He verbalized understanding and thanked me for calling him back.

## 2016-03-31 NOTE — Telephone Encounter (Signed)
New message        The pt was doing exercises and had cp and sob    Pt c/o of Chest Pain: STAT if CP now or developed within 24 hours  1. Are you having CP right now? little  2. Are you experiencing any other symptoms (ex. SOB, nausea, vomiting, sweating)? Did not hear over the phone sob  3. How long have you been experiencing CP? Only today 4. Is your CP continuous or coming and going? Coming and going  5. Have you taken Nitroglycerin? No    Pt c/o Shortness Of Breath: STAT if SOB developed within the last 24 hours or pt is noticeably SOB on the phone  1. Are you currently SOB (can you hear that pt is SOB on the phone)? Per pt yes did not hear over the phone  2. How long have you been experiencing SOB? Few mins ago 3. Are you SOB when sitting or when up moving around?moving around  4. Are you currently experiencing any other symptoms? Light headness  ?

## 2016-04-02 ENCOUNTER — Encounter (HOSPITAL_COMMUNITY): Payer: BC Managed Care – PPO

## 2016-04-05 ENCOUNTER — Encounter (HOSPITAL_COMMUNITY): Payer: BC Managed Care – PPO

## 2016-04-06 ENCOUNTER — Other Ambulatory Visit: Payer: BC Managed Care – PPO | Admitting: *Deleted

## 2016-04-06 DIAGNOSIS — E785 Hyperlipidemia, unspecified: Secondary | ICD-10-CM

## 2016-04-06 LAB — HEPATIC FUNCTION PANEL
ALT: 28 U/L (ref 9–46)
AST: 18 U/L (ref 10–35)
Albumin: 4.6 g/dL (ref 3.6–5.1)
Alkaline Phosphatase: 72 U/L (ref 40–115)
Bilirubin, Direct: 0.1 mg/dL (ref <=0.2)
Indirect Bilirubin: 0.2 mg/dL (ref 0.2–1.2)
Total Bilirubin: 0.3 mg/dL (ref 0.2–1.2)
Total Protein: 6.9 g/dL (ref 6.1–8.1)

## 2016-04-06 LAB — LIPID PANEL
Cholesterol: 174 mg/dL (ref 125–200)
HDL: 50 mg/dL (ref >=40)
LDL Cholesterol: 109 mg/dL (ref <130)
Total CHOL/HDL Ratio: 3.5 Ratio (ref <=5.0)
Triglycerides: 73 mg/dL (ref <150)
VLDL: 15 mg/dL (ref <30)

## 2016-04-07 ENCOUNTER — Telehealth: Payer: Self-pay | Admitting: Interventional Cardiology

## 2016-04-07 ENCOUNTER — Encounter (HOSPITAL_COMMUNITY)
Admission: RE | Admit: 2016-04-07 | Payer: BC Managed Care – PPO | Source: Ambulatory Visit | Attending: Interventional Cardiology | Admitting: Interventional Cardiology

## 2016-04-07 NOTE — Telephone Encounter (Signed)
**Note De-Identified  Obfuscation** The pt is advised to continue to monitor his BPs and to call us back if his BP continues to be elevated. He verbalized understanding and thanked me for calling him back.

## 2016-04-07 NOTE — Telephone Encounter (Signed)
New Message  Blood Pressure- 1. 133/85-Today, 132/77-This morning, 107/64-Last night, 133/84-Yesterday Afternoon, 140/86-Yesterday Morning 2. Extreme fatigue and stomach ache 3. Pt calling because nothing has changed. Went to PCP and they think it's depression and prescribed him Symbola. Pt also verbalized if his BP gets over 130/80 to contact our office 60-80 Pulse Rate.  Please follow up.

## 2016-04-09 ENCOUNTER — Telehealth: Payer: Self-pay | Admitting: *Deleted

## 2016-04-09 ENCOUNTER — Encounter (HOSPITAL_COMMUNITY): Payer: BC Managed Care – PPO

## 2016-04-09 NOTE — Telephone Encounter (Signed)
Pt notified of lab results and findings. Advised pt to increase Atorvastatin to 80 mg ; repeat FLP/LFT 3 months. When I advised pt 06/2016 labs are fasting; pt states this lab work he did not fast, said he does not remember being told to fast. I advised then at this time let me d/w Brynda Rim. PA for further recommendations due to this lab work was not fasting. I advised we may repeat fasting lab work in about 6-8 weeks on the current dose of Atorvastatin 40 mg daily to see if the fasting lab work shows better results. I did advise that we may still need to increase Atorvastatin. Pt agreeable to plan of care.

## 2016-04-09 NOTE — Telephone Encounter (Signed)
New message    Pt verbalized he is returning call for RN for lab results

## 2016-04-09 NOTE — Telephone Encounter (Signed)
Lmtcb to go over lab results and recommendations.  

## 2016-04-12 ENCOUNTER — Telehealth (HOSPITAL_COMMUNITY): Payer: Self-pay | Admitting: *Deleted

## 2016-04-12 ENCOUNTER — Telehealth: Payer: Self-pay | Admitting: *Deleted

## 2016-04-12 ENCOUNTER — Encounter (HOSPITAL_COMMUNITY): Payer: BC Managed Care – PPO

## 2016-04-12 NOTE — Telephone Encounter (Signed)
I s/w pt in regards to [name] recommendatoions ok to stay on Atorvastatin 40 mg , FLP/LFT ON 06/15/16. Pt is agreeable to plan nof care.

## 2016-04-13 ENCOUNTER — Telehealth (HOSPITAL_COMMUNITY): Payer: Self-pay | Admitting: *Deleted

## 2016-04-14 ENCOUNTER — Encounter (HOSPITAL_COMMUNITY)
Admission: RE | Admit: 2016-04-14 | Discharge: 2016-04-14 | Disposition: A | Discharge status: Home / Self Care | Payer: BC Managed Care – PPO | Source: Ambulatory Visit | Attending: Interventional Cardiology | Admitting: Interventional Cardiology

## 2016-04-14 DIAGNOSIS — I213 ST elevation (STEMI) myocardial infarction of unspecified site: Secondary | ICD-10-CM | POA: Insufficient documentation

## 2016-04-14 DIAGNOSIS — Z79899 Other long term (current) drug therapy: Secondary | ICD-10-CM | POA: Diagnosis not present

## 2016-04-14 DIAGNOSIS — Z7982 Long term (current) use of aspirin: Secondary | ICD-10-CM | POA: Diagnosis not present

## 2016-04-14 DIAGNOSIS — Z951 Presence of aortocoronary bypass graft: Secondary | ICD-10-CM | POA: Insufficient documentation

## 2016-04-14 DIAGNOSIS — Z7902 Long term (current) use of antithrombotics/antiplatelets: Secondary | ICD-10-CM | POA: Diagnosis not present

## 2016-04-14 NOTE — Progress Notes (Signed)
Cardiac Individual Treatment Plan  Patient Details  Name: [name] MRN: [id number] Date of Birth: 1955/10/17 Referring Provider:   Flowsheet Row CARDIAC REHAB PHASE II ORIENTATION from 02/03/2016 in [place]  Referring Provider  Larae Grooms MD      Initial Encounter Date:  [facility] PHASE II ORIENTATION from 02/03/2016 in [place]  Date  02/03/16  Referring Provider  Larae Grooms MD      Visit Diagnosis: ST elevation (STEMI) myocardial infarction of unspecified site ([place])  S/P CABG x 5  Patient's Home Medications on Admission:  Current Outpatient Prescriptions:  .  aspirin 81 MG EC tablet, Take 1 tablet (81 mg total) by mouth daily., Disp: , Rfl:  .  atorvastatin (LIPITOR) 40 MG tablet, Take 1 tablet (40 mg total) by mouth daily., Disp: 30 tablet, Rfl: 6 .  chlordiazePOXIDE (LIBRIUM) 25 MG capsule, Take 25 mg by mouth daily as needed for anxiety. , Disp: , Rfl:  .  clopidogrel (PLAVIX) 75 MG tablet, Take 1 tablet (75 mg total) by mouth daily., Disp: 30 tablet, Rfl: 11 .  lisinopril (PRINIVIL,ZESTRIL) 5 MG tablet, Take 1 tablet (5 mg total) by mouth daily., Disp: 90 tablet, Rfl: 3 .  Multiple Vitamins-Minerals (ONE-A-DAY MENS 50+ ADVANTAGE PO), Take 1 tablet by mouth daily. , Disp: , Rfl:  .  nitroGLYCERIN (NITROSTAT) 0.4 MG SL tablet, Place 1 tablet (0.4 mg total) under the tongue every 5 (five) minutes as needed for chest pain., Disp: 25 tablet, Rfl: 3 .  pantoprazole (PROTONIX) 40 MG tablet, Take 40 mg by mouth daily as needed (FOR REFLUX). FOR REFLUX, Disp: , Rfl:  .  ranitidine (ZANTAC) 150 MG tablet, Take 150 mg by mouth as needed for heartburn., Disp: , Rfl:  .  zolpidem (AMBIEN) 10 MG tablet, Take 10 mg by mouth at bedtime as needed for sleep. Reported on 02/09/2016, Disp: , Rfl:   Past Medical History: Past Medical History:  Diagnosis Date  . Acute hepatitis C without  mention of hepatic coma    Tx by [facility] for Hep.  negative for hep c 2/15.  Marland Kitchen Anxiety   . CAD (coronary artery disease)    a. 10/2015 ant STEMI >> LHC with 3 v CAD; oLAD tx with POBA >> emergent CABG.  . Chronic systolic CHF (congestive heart failure) ([place])   . Colitis   . Esophageal reflux    eosinophil esophagitis  . Former tobacco use   . Ischemic cardiomyopathy    a. EF 25-30% at intraop TEE 4/17  //  b. Limited Echo 5/17 - EF 45-50%, mild ant HK  . Sinus bradycardia    a. HR dropping into 40s in 02/2016 -> BB reduced.  . Symptomatic hypotension    a. 02/2016 ER visit -> meds reduced.    Tobacco Use: History  Smoking Status  . Former Smoker  . Packs/day: 0.50  . Types: Cigarettes  . Quit date: 11/28/2015  Smokeless Tobacco  . Never Used    Labs: Recent Review Flowsheet Data    Labs for ITP Cardiac and Pulmonary Rehab Latest Ref Rng & Units 11/29/2015 11/29/2015 02/12/2016 02/12/2016 04/06/2016   Cholestrol 125 - 200 mg/dL - - 164 165 174   LDLCALC <130 mg/dL - - 88 88 109   HDL >=40 mg/dL - - 47 49 50   Trlycerides <150 mg/dL - - 143 142 73   Hemoglobin A1c 4.8 - 5.6 % - - - - -  PHART 7.350 - 7.450 7.415 - - - -   PCO2ART 35.0 - 45.0 mmHg 43.1 - - - -   HCO3 20.0 - 24.0 mEq/L 27.7(H) - - - -   TCO2 0 - 100 mmol/L 29 23 - - -   ACIDBASEDEF 0.0 - 2.0 mmol/L - - - - -   O2SAT % 98.0 - - - -      Capillary Blood Glucose: Lab Results  Component Value Date   GLUCAP 104 (H) 11/30/2015   GLUCAP 106 (H) 11/30/2015   GLUCAP 93 11/29/2015   GLUCAP 129 (H) 11/29/2015   GLUCAP 98 11/29/2015     Exercise Target Goals:    Exercise Program Goal: Individual exercise prescription set with THRR, safety & activity barriers. Participant demonstrates ability to understand and report RPE using BORG scale, to self-measure pulse accurately, and to acknowledge the importance of the exercise prescription.  Exercise Prescription Goal: Starting with aerobic activity 30 plus minutes a day,  3 days per week for initial exercise prescription. Provide home exercise prescription and guidelines that participant acknowledges understanding prior to discharge.  Activity Barriers & Risk Stratification:     Activity Barriers & Cardiac Risk Stratification - 02/03/16 1418      Activity Barriers & Cardiac Risk Stratification   Activity Barriers Other (comment)   Comments Rare gout. Occassionaly fluid on rt knee.   Cardiac Risk Stratification High      6 Minute Walk:     6 Minute Walk    Row Name 02/03/16 1629         6 Minute Walk   Phase Initial     Distance 1589 feet     Walk Time 6 minutes     # of Rest Breaks 0     MPH 3.01     METS 3.89     RPE 11     VO2 Peak 13.61     Symptoms Yes (comment)     Comments Lightheaded     Resting HR 55 bpm     Resting BP 111/72     Max Ex. HR 105 bpm     Max Ex. BP 94/60     2 Minute Post BP 102/64        Initial Exercise Prescription:     Initial Exercise Prescription - 02/03/16 1600      Date of Initial Exercise RX and Referring Provider   Date 02/03/16   Referring Provider Larae Grooms MD     Treadmill   MPH 2   Grade 1   Minutes 10   METs 2.81     Bike   Level 1   Minutes 10   METs 3.44     NuStep   Level 2   Minutes 10   METs 2.5     Prescription Details   Frequency (times per week) 3   Duration Progress to 30 minutes of continuous aerobic without signs/symptoms of physical distress     Intensity   THRR 40-80% of Max Heartrate 64-128 bpm   Ratings of Perceived Exertion 11-13   Perceived Dyspnea 0-4     Progression   Progression Continue to progress workloads to maintain intensity without signs/symptoms of physical distress.     Resistance Training   Training Prescription Yes   Weight 2 lbs   Reps 10-12      Perform Capillary Blood Glucose checks as needed.  Exercise Prescription Changes:      Exercise Prescription Changes  Row Name 02/27/16 1500 03/03/16 1700 03/08/16 1700  04/14/16 1800       Exercise Review   Progression Yes Yes Yes Yes      Response to Exercise   Blood Pressure (Admit) 128/70 108/78 98/68 102/64    Blood Pressure (Exercise) 142/80 158/86 128/78 134/70    Blood Pressure (Exit) 102/72 115/81 100/62 104/62    Heart Rate (Admit) 84 bpm 83 bpm 102 bpm 75 bpm    Heart Rate (Exercise) 142 bpm 149 bpm 147 bpm 170 bpm    Heart Rate (Exit) 82 bpm 84 bpm 105 bpm 90 bpm    Rating of Perceived Exertion (Exercise) '11 13 9 12    '$ Symptoms none none none none    Comments  - Reviewed home exercise guidelines on 03/03/16. Reviewed home exercise guidelines on 03/03/16. Reviewed home exercise guidelines on 03/03/16.    Duration Progress to 30 minutes of continuous aerobic without signs/symptoms of physical distress Progress to 30 minutes of continuous aerobic without signs/symptoms of physical distress Progress to 30 minutes of continuous aerobic without signs/symptoms of physical distress Progress to 30 minutes of continuous aerobic without signs/symptoms of physical distress    Intensity THRR unchanged THRR unchanged THRR unchanged THRR unchanged      Progression   Progression Continue to progress workloads to maintain intensity without signs/symptoms of physical distress. Continue to progress workloads to maintain intensity without signs/symptoms of physical distress. Continue to progress workloads to maintain intensity without signs/symptoms of physical distress. Continue to progress workloads to maintain intensity without signs/symptoms of physical distress.    Average METs 3.5 3.7 3.9 4.3      Resistance Training   Training Prescription Yes Yes Yes Yes    Weight 2 lbs 2 lbs 2 lbs 2 lbs    Reps 10-12 10-12 10-12 10-12      Interval Training   Interval Training No No No No      Treadmill   MPH - 2 2.1 2.5    Grade - '1 2 2    '$ Minutes - '10 10 10    '$ METs - 2.81 3.19 3.6      Bike   Level '2 2 2 2    '$ Minutes '10 10 10 10    '$ METs 5.74 5.83 5.83 5.79       NuStep   Level '2 2 3 4    '$ Minutes '10 10 10 10    '$ METs 2.4 2.4 2.9 3.6      Track   Laps 8 -  -  -    Minutes 10 -  -  -    METs 2.39 -  -  -      Home Exercise Plan   Plans to continue exercise at  - [place]    Frequency  - Add 3 additional days to program exercise sessions. Add 3 additional days to program exercise sessions. Add 3 additional days to program exercise sessions.       Exercise Comments:      Exercise Comments    Row Name 02/27/16 1534 03/03/16 1658 03/08/16 1717 04/14/16 1811     Exercise Comments Returned to exercise and tolerated well, without c/o. Reviewed home exercise with patient. Pt currently walking 20 minutes 3x's/day 2 to 4 days outside of cardiac rehab.  Patient anxious to increase workloads. Progressing well thus far with exercise. Patient returned today after being out due to health issues. Pt has been walking 45-60 minutes 4  days/week while he's been out.       Discharge Exercise Prescription (Final Exercise Prescription Changes):     Exercise Prescription Changes - 04/14/16 1800      Exercise Review   Progression Yes     Response to Exercise   Blood Pressure (Admit) 102/64   Blood Pressure (Exercise) 134/70   Blood Pressure (Exit) 104/62   Heart Rate (Admit) 75 bpm   Heart Rate (Exercise) 170 bpm   Heart Rate (Exit) 90 bpm   Rating of Perceived Exertion (Exercise) 12   Symptoms none   Comments Reviewed home exercise guidelines on 03/03/16.   Duration Progress to 30 minutes of continuous aerobic without signs/symptoms of physical distress   Intensity THRR unchanged     Progression   Progression Continue to progress workloads to maintain intensity without signs/symptoms of physical distress.   Average METs 4.3     Resistance Training   Training Prescription Yes   Weight 2 lbs   Reps 10-12     Interval Training   Interval Training No     Treadmill   MPH 2.5   Grade 2   Minutes 10   METs 3.6     Bike   Level 2    Minutes 10   METs 5.79     NuStep   Level 4   Minutes 10   METs 3.6     Home Exercise Plan   Plans to continue exercise at Home   Frequency Add 3 additional days to program exercise sessions.      Nutrition:  Target Goals: Understanding of nutrition guidelines, daily intake of sodium '1500mg'$ , cholesterol '200mg'$ , calories 30% from fat and 7% or less from saturated fats, daily to have 5 or more servings of fruits and vegetables.  Biometrics:     Pre Biometrics - 02/03/16 1627      Pre Biometrics   Height 5' 9.25" (1.759 m)   Weight 172 lb 2.9 oz (78.1 kg)   Waist Circumference 34.25 inches   Hip Circumference 39.25 inches   Waist to Hip Ratio 0.87 %   BMI (Calculated) 25.3   Triceps Skinfold 15 mm   % Body Fat 23.5 %   Grip Strength 37.5 kg   Flexibility 15 in   Single Leg Stand 25 seconds       Nutrition Therapy Plan and Nutrition Goals:     Nutrition Therapy & Goals - 02/05/16 0957      Nutrition Therapy   Diet Therapeutic Lifestyle Changes     Personal Nutrition Goals   Personal Goal #1 Maintain wt around 172 lb while in [place], educate and counsel regarding individualized specific dietary modifications aiming towards targeted core components such as weight, hypertension, lipid management, diabetes, heart failure and other comorbidities.   Expected Outcomes Short Term Goal: Understand basic principles of dietary content, such as calories, fat, sodium, cholesterol and nutrients.;Long Term Goal: Adherence to prescribed nutrition plan.      Nutrition Discharge: Nutrition Scores:     Nutrition Assessments - 02/05/16 0959      MEDFICTS Scores   Pre Score 36      Nutrition Goals Re-Evaluation:   Psychosocial: Target Goals: Acknowledge presence or absence of depression, maximize coping skills, provide positive support system. Participant is able to verbalize types and ability to use techniques and  skills needed for reducing stress and depression.  Initial Review & Psychosocial Screening:  Initial Psych Review & Screening - 02/05/16 [place]? Yes     Barriers   Psychosocial barriers to participate in program There are no identifiable barriers or psychosocial needs.     Screening Interventions   Interventions Encouraged to exercise      Quality of Life Scores:     Quality of Life - 03/18/16 1348      Quality of Life Scores   Psych/Spiritual Pre --  Thai has been meeting with the hospital chaplain weekly and has found this to be helpful.   GLOBAL Pre --  Quality of life reviewed with Mr Alessio and forwarded to Dr Hassell Done office for review.       PHQ-9: Recent Review Flowsheet Data    Depression screen [facility] 2/9 02/09/2016   Decreased Interest 2   Down, Depressed, Hopeless 2   PHQ - 2 Score 4   Altered sleeping 2   Tired, decreased energy 0   Change in appetite 0   Feeling bad or failure about yourself  0   Trouble concentrating 0   Moving slowly or fidgety/restless 0   Suicidal thoughts 0   PHQ-9 Score 6   Difficult doing work/chores Somewhat difficult      Psychosocial Evaluation and Intervention:   Psychosocial Re-Evaluation:     Psychosocial Re-Evaluation    Row Name 03/18/16 1350 04/14/16 1700           Psychosocial Re-Evaluation   Interventions Physician referral;Stress management education Stress management education;Encouraged to attend Cardiac Rehabilitation for the exercise      Comments -  Milt has been meeting with the hospital chaplain for  support  -      Continued Psychosocial Services Needed Yes Yes         Vocational Rehabilitation: Provide vocational rehab assistance to qualifying candidates.   Vocational Rehab Evaluation & Intervention:     Vocational Rehab - 02/05/16 1013      Initial Vocational Rehab Evaluation & Intervention   Assessment shows need for Vocational  Rehabilitation No      Education: Education Goals: Education classes will be provided on a weekly basis, covering required topics. Participant will state understanding/return demonstration of topics presented.  Learning Barriers/Preferences:     Learning Barriers/Preferences - 02/05/16 1007      Learning Barriers/Preferences   Learning Barriers --  wears glasses   Learning Preferences Written Material;Audio      Education Topics: Count Your Pulse:  -Group instruction provided by verbal instruction, demonstration, patient participation and written materials to support subject.  Instructors address importance of being able to find your pulse and how to count your pulse when at home without a heart monitor.  Patients get hands on experience counting their pulse with staff help and individually. Flowsheet Row CARDIAC REHAB PHASE II EXERCISE from 04/14/2016 in [place]  Date  03/05/16  Instruction Review Code  2- meets goals/outcomes      Heart Attack, Angina, and Risk Factor Modification:  -Group instruction provided by verbal instruction, video, and written materials to support subject.  Instructors address signs and symptoms of angina and heart attacks.    Also discuss risk factors for heart disease and how to make changes to improve heart health risk factors.   Functional Fitness:  -Group instruction provided by verbal instruction, demonstration, patient participation, and written materials to support subject.  Instructors address safety measures for doing things  around the house.  Discuss how to get up and down off the floor, how to pick things up properly, how to safely get out of a chair without assistance, and balance training.   Meditation and Mindfulness:  -Group instruction provided by verbal instruction, patient participation, and written materials to support subject.  Instructor addresses importance of mindfulness and meditation practice  to help reduce stress and improve awareness.  Instructor also leads participants through a meditation exercise.  Flowsheet Row CARDIAC REHAB PHASE II EXERCISE from 04/14/2016 in [place]  Date  03/03/16  Educator  [place]  Instruction Review Code  2- meets goals/outcomes      Stretching for Flexibility and Mobility:  -Group instruction provided by verbal instruction, patient participation, and written materials to support subject.  Instructors lead participants through series of stretches that are designed to increase flexibility thus improving mobility.  These stretches are additional exercise for major muscle groups that are typically performed during regular warm up and cool down.   Hands Only CPR Anytime:  -Group instruction provided by verbal instruction, video, patient participation and written materials to support subject.  Instructors co-teach with [facility]HA video for hands only CPR.  Participants get hands on experience with mannequins.   Nutrition I class: Heart Healthy Eating:  -Group instruction provided by PowerPoint slides, verbal discussion, and written materials to support subject matter. The instructor gives an explanation and review of the Therapeutic Lifestyle Changes diet recommendations, which includes a discussion on lipid goals, dietary fat, sodium, fiber, plant stanol/sterol esters, sugar, and the components of a well-balanced, healthy diet.   Nutrition II class: Lifestyle Skills:  -Group instruction provided by PowerPoint slides, verbal discussion, and written materials to support subject matter. The instructor gives an explanation and review of label reading, grocery shopping for heart health, heart healthy recipe modifications, and ways to make healthier choices when eating out.   Diabetes Question & Answer:  -Group instruction provided by PowerPoint slides, verbal discussion, and written materials to support subject matter.  The instructor gives an explanation and review of diabetes co-morbidities, pre- and post-prandial blood glucose goals, pre-exercise blood glucose goals, signs, symptoms, and treatment of hypoglycemia and hyperglycemia, and foot care basics.   Diabetes Blitz:  -Group instruction provided by PowerPoint slides, verbal discussion, and written materials to support subject matter. The instructor gives an explanation and review of the physiology behind type 1 and type 2 diabetes, diabetes medications and rational behind using different medications, pre- and post-prandial blood glucose recommendations and Hemoglobin A1c goals, diabetes diet, and exercise including blood glucose guidelines for exercising safely.    Portion Distortion:  -Group instruction provided by PowerPoint slides, verbal discussion, written materials, and food models to support subject matter. The instructor gives an explanation of serving size versus portion size, changes in portions sizes over the last 20 years, and what consists of a serving from each food group. Flowsheet Row CARDIAC REHAB PHASE II EXERCISE from 04/14/2016 in [place]  Date  02/25/16  Educator  [place]  Instruction Review Code  2- meets goals/outcomes      Stress Management:  -Group instruction provided by verbal instruction, video, and written materials to support subject matter.  Instructors review role of stress in heart disease and how to cope with stress positively.     Exercising on Your Own:  -Group instruction provided by verbal instruction, power point, and written materials to support subject.  Instructors discuss benefits  of exercise, components of exercise, frequency and intensity of exercise, and end points for exercise.  Also discuss use of nitroglycerin and activating EMS.  Review options of places to exercise outside of rehab.  Review guidelines for sex with heart disease.   Cardiac Drugs I:  -Group instruction  provided by verbal instruction and written materials to support subject.  Instructor reviews cardiac drug classes: antiplatelets, anticoagulants, beta blockers, and statins.  Instructor discusses reasons, side effects, and lifestyle considerations for each drug class. Flowsheet Row CARDIAC REHAB PHASE II EXERCISE from 04/14/2016 in [place]  Date  04/14/16  Instruction Review Code  2- meets goals/outcomes      Cardiac Drugs II:  -Group instruction provided by verbal instruction and written materials to support subject.  Instructor reviews cardiac drug classes: angiotensin converting enzyme inhibitors (ACE-I), angiotensin II receptor blockers (ARBs), nitrates, and calcium channel blockers.  Instructor discusses reasons, side effects, and lifestyle considerations for each drug class. Flowsheet Row CARDIAC REHAB PHASE II EXERCISE from 04/14/2016 in [place]  Date  03/10/16  Instruction Review Code  2- meets goals/outcomes      Anatomy and Physiology of the Circulatory System:  -Group instruction provided by verbal instruction, video, and written materials to support subject.  Reviews functional anatomy of heart, how it relates to various diagnoses, and what role the heart plays in the overall system.   Knowledge Questionnaire Score:     Knowledge Questionnaire Score - 02/03/16 1629      Knowledge Questionnaire Score   Pre Score 20/24      Core Components/Risk Factors/Patient Goals at Admission:     Personal Goals and Risk Factors at Admission - 02/03/16 1648      Core Components/Risk Factors/Patient Goals on Admission   Tobacco Cessation Yes   Number of packs per day <1   Intervention Assist the participant in steps to quit. Provide individualized education and counseling about committing to Tobacco Cessation, relapse prevention, and pharmacological support that can be provided by physician.   Expected Outcomes Short  Term: Will quit all tobacco product use, adhering to prevention of relapse plan.;Long Term: Complete abstinence from all tobacco products for at least 12 months from quit date.  Patient quit 11/28/15 and is currently abstaining.   Stress Yes   Intervention Offer individual and/or small group education and counseling on adjustment to heart disease, stress management and health-related lifestyle change. Teach and support self-help strategies.;Refer participants experiencing significant psychosocial distress to appropriate mental health specialists for further evaluation and treatment. When possible, include family members and significant others in education/counseling sessions.   Expected Outcomes Short Term: Participant demonstrates changes in health-related behavior, relaxation and other stress management skills, ability to obtain effective social support, and compliance with psychotropic medications if prescribed.;Long Term: Emotional wellbeing is indicated by absence of clinically significant psychosocial distress or social isolation.   Personal Goal Other Yes   Personal Goal Regain energy. Start regular exercise prorgam. Participate in mission trips and help with church activities.   Intervention Provide individualized exercise program to improve cardiorespiratory fitness.   Expected Outcomes Energy and stamina imrpvovement as fitness level increases. Able to participate in more activites without feeling tired.      Core Components/Risk Factors/Patient Goals Review:      Goals and Risk Factor Review    Row Name 03/08/16 1725 04/14/16 1813           Core Components/Risk Factors/Patient Goals Review  Personal Goals Review Stress;Other Other      Review Patient has seen Jeanella Craze from [facility] in consultation. Pt hasn't regained energy yet, but is is exercising 5 days/week. Patient has had health issues that kept him from attending cardiac rehab regularly but retruned to  exercise today. Pt has been walking 4 days/ week at home 45-60 minutes.       Expected Outcomes Maintain walking program at home.  Maintain walking program regularly to help improve strength and endurance and increase energy.         Core Components/Risk Factors/Patient Goals at Discharge (Final Review):      Goals and Risk Factor Review - 04/14/16 1813      Core Components/Risk Factors/Patient Goals Review   Personal Goals Review Other   Review Patient has had health issues that kept him from attending cardiac rehab regularly but retruned to exercise today. Pt has been walking 4 days/ week at home 45-60 minutes.    Expected Outcomes Maintain walking program regularly to help improve strength and endurance and increase energy.      ITP Comments:     ITP Comments    Row Name 02/03/16 1444           ITP Comments Dr. Fransico Him, Medical Director           Comments: Pt is making expected progress toward personal goals after completing 9 sessions. Recommend continued exercise and life style modification education including  stress management and relaxation techniques to decrease cardiac risk profile. Landers returned to exercise after being absent for several weeks. Josip reports that he has been depressed and feels better now. Appointment made for Simona Huh to meet with Jeanella Craze next Wednesday.Barnet Pall, RN,BSN 04/16/2016 9:05 AM

## 2016-04-14 NOTE — Progress Notes (Signed)
Toa returned to exercise at cardiac today after being absent for several weeks due to work . Seng heart rate was noted at 170's o on the treadmill, nonsustained.. Blood pressure 110/60. Exercise stopped. 12 lead ECG obtained. Jamaul reports having a very stressful day at work. Oxygen saturation 97% on room air. Breck also reported having some discomfort below his chest area after his exercise was stopped. Danna Hefty NP paged and notified. Gerald Stabs came to cardiac rehab and evaluated Mr Clauss. Gerald Stabs reviewed the 12 lead ECG and said it was sinus tach. Gerald Stabs also reviewed the ECG on the treadmill and thought it was also sinus tach. Gerald Stabs thought Mr Ruddick's chest discomfort could possibly be related to reflux. Shelby did say the discomfort went away. Gerald Stabs said that Mr Cherian is okay to go home and that he may return to exercise on Friday. I asked Mr Ickes to walk on the track instead of using the treadmill on Friday. Patient states understanding. Barnet Pall, RN,BSN 04/14/2016 4:53 PM

## 2016-04-16 ENCOUNTER — Telehealth: Payer: Self-pay | Admitting: Interventional Cardiology

## 2016-04-16 ENCOUNTER — Encounter (HOSPITAL_COMMUNITY)
Admission: RE | Admit: 2016-04-16 | Discharge: 2016-04-16 | Disposition: A | Discharge status: Home / Self Care | Payer: BC Managed Care – PPO | Source: Ambulatory Visit | Attending: Interventional Cardiology | Admitting: Interventional Cardiology

## 2016-04-16 VITALS — BP 124/83 | HR 98 | Ht 69.25 in | Wt 174.8 lb

## 2016-04-16 DIAGNOSIS — I213 ST elevation (STEMI) myocardial infarction of unspecified site: Secondary | ICD-10-CM

## 2016-04-16 DIAGNOSIS — Z951 Presence of aortocoronary bypass graft: Secondary | ICD-10-CM

## 2016-04-16 NOTE — Telephone Encounter (Signed)
New Message  Patient c/o Palpitations:  High priority if patient c/o lightheadedness and shortness of breath.  1. How long have you been having palpitations? This week only.  2. Are you currently experiencing lightheadedness and shortness of breath? Pt voiced a little lightheadedness from walking the dog this morning but nothing too serious.  3. Have you checked your BP and heart rate? (document readings) 118/82-90(HR)-When pt woke up, BP-135/86-77(HR) this morning from walking dog, Yesterday 121/78-115(HR)  4. Are you experiencing any other symptoms? Yes, yesterday pt walked a mile and his pulse rate went up to 115 for about thirty minutes.  Pt voiced last day of rehab heart rate spiked up to 170 and they informed pt to immediately stop when he felt fine.  Pt voiced he has rehab today at 230 pm today as well.  Please follow up with pt. Thanks!

## 2016-04-16 NOTE — Telephone Encounter (Signed)
I reviewed his ECG from 8/16.  HR was down to 107 by the time the ECG was done.  Was he feeling poorly with his high HR.?

## 2016-04-16 NOTE — Telephone Encounter (Signed)
**Note De-Identified  Obfuscation** The pt is concerned because at rehab on Wednesday he states that his HR rate reached 170 bpm. He states that they did a EKG.  He wants Dr Irish Lack to review his EKG that is in his chart and let him know if there is something that needs to be done.  The pt wants to know how high or low his HR should be before he gets concerned and calls the office.  Please advise.

## 2016-04-16 NOTE — Progress Notes (Signed)
Mr Kosta's heart rate went up to the 150's today at cardiac rehab. Patient taken off the bike and moved to the nustep. Blood pressures have been stable. Jeani Hawking [address], Dr Hassell Done nurse called and notified about the patient continuing to exceed his target heart rate. ECG tracing's re faxed to Dr. Hassell Done office for review.Barnet Pall, RN,BSN 04/16/2016 4:42 PM

## 2016-04-19 ENCOUNTER — Telehealth: Payer: Self-pay | Admitting: Interventional Cardiology

## 2016-04-19 ENCOUNTER — Encounter (HOSPITAL_COMMUNITY): Payer: Self-pay

## 2016-04-19 ENCOUNTER — Emergency Department (HOSPITAL_COMMUNITY): Payer: BC Managed Care – PPO

## 2016-04-19 ENCOUNTER — Encounter (HOSPITAL_COMMUNITY): Payer: BC Managed Care – PPO

## 2016-04-19 ENCOUNTER — Observation Stay (HOSPITAL_COMMUNITY)
Admission: EM | Admit: 2016-04-19 | Discharge: 2016-04-21 | Disposition: A | Discharge status: Home / Self Care | Payer: BC Managed Care – PPO | Attending: Cardiology | Admitting: Cardiology

## 2016-04-19 DIAGNOSIS — I2510 Atherosclerotic heart disease of native coronary artery without angina pectoris: Secondary | ICD-10-CM | POA: Insufficient documentation

## 2016-04-19 DIAGNOSIS — F419 Anxiety disorder, unspecified: Secondary | ICD-10-CM | POA: Diagnosis not present

## 2016-04-19 DIAGNOSIS — R9439 Abnormal result of other cardiovascular function study: Secondary | ICD-10-CM

## 2016-04-19 DIAGNOSIS — I25118 Atherosclerotic heart disease of native coronary artery with other forms of angina pectoris: Secondary | ICD-10-CM | POA: Diagnosis not present

## 2016-04-19 DIAGNOSIS — Z7982 Long term (current) use of aspirin: Secondary | ICD-10-CM | POA: Insufficient documentation

## 2016-04-19 DIAGNOSIS — Z79899 Other long term (current) drug therapy: Secondary | ICD-10-CM | POA: Insufficient documentation

## 2016-04-19 DIAGNOSIS — Z7902 Long term (current) use of antithrombotics/antiplatelets: Secondary | ICD-10-CM | POA: Insufficient documentation

## 2016-04-19 DIAGNOSIS — R072 Precordial pain: Secondary | ICD-10-CM | POA: Diagnosis present

## 2016-04-19 DIAGNOSIS — Z720 Tobacco use: Secondary | ICD-10-CM | POA: Diagnosis not present

## 2016-04-19 DIAGNOSIS — I5022 Chronic systolic (congestive) heart failure: Secondary | ICD-10-CM | POA: Insufficient documentation

## 2016-04-19 DIAGNOSIS — I25810 Atherosclerosis of coronary artery bypass graft(s) without angina pectoris: Secondary | ICD-10-CM | POA: Diagnosis not present

## 2016-04-19 DIAGNOSIS — I5032 Chronic diastolic (congestive) heart failure: Secondary | ICD-10-CM | POA: Diagnosis not present

## 2016-04-19 DIAGNOSIS — R0989 Other specified symptoms and signs involving the circulatory and respiratory systems: Secondary | ICD-10-CM | POA: Diagnosis not present

## 2016-04-19 DIAGNOSIS — R079 Chest pain, unspecified: Secondary | ICD-10-CM

## 2016-04-19 DIAGNOSIS — E785 Hyperlipidemia, unspecified: Secondary | ICD-10-CM | POA: Diagnosis not present

## 2016-04-19 DIAGNOSIS — I110 Hypertensive heart disease with heart failure: Secondary | ICD-10-CM | POA: Insufficient documentation

## 2016-04-19 DIAGNOSIS — K219 Gastro-esophageal reflux disease without esophagitis: Secondary | ICD-10-CM | POA: Insufficient documentation

## 2016-04-19 DIAGNOSIS — Z951 Presence of aortocoronary bypass graft: Secondary | ICD-10-CM | POA: Insufficient documentation

## 2016-04-19 DIAGNOSIS — M797 Fibromyalgia: Secondary | ICD-10-CM | POA: Diagnosis not present

## 2016-04-19 DIAGNOSIS — F3160 Bipolar disorder, current episode mixed, unspecified: Secondary | ICD-10-CM | POA: Diagnosis not present

## 2016-04-19 DIAGNOSIS — R931 Abnormal findings on diagnostic imaging of heart and coronary circulation: Secondary | ICD-10-CM | POA: Diagnosis not present

## 2016-04-19 DIAGNOSIS — Z87891 Personal history of nicotine dependence: Secondary | ICD-10-CM | POA: Diagnosis not present

## 2016-04-19 DIAGNOSIS — I252 Old myocardial infarction: Secondary | ICD-10-CM

## 2016-04-19 DIAGNOSIS — I2109 ST elevation (STEMI) myocardial infarction involving other coronary artery of anterior wall: Secondary | ICD-10-CM | POA: Diagnosis not present

## 2016-04-19 LAB — CBC
HCT: 42.6 % (ref 39.0–52.0)
Hemoglobin: 13.7 g/dL (ref 13.0–17.0)
MCH: 28.6 pg (ref 26.0–34.0)
MCHC: 32.2 g/dL (ref 30.0–36.0)
MCV: 88.9 fL (ref 78.0–100.0)
Platelets: 216 10*3/uL (ref 150–400)
RBC: 4.79 MIL/uL (ref 4.22–5.81)
RDW: 14.5 % (ref 11.5–15.5)
WBC: 7.2 10*3/uL (ref 4.0–10.5)

## 2016-04-19 LAB — I-STAT TROPONIN, ED
Troponin i, poc: 0 ng/mL (ref 0.00–0.08)
Troponin i, poc: 0.01 ng/mL (ref 0.00–0.08)

## 2016-04-19 LAB — MAGNESIUM: Magnesium: 2.1 mg/dL (ref 1.7–2.4)

## 2016-04-19 LAB — BASIC METABOLIC PANEL
Anion gap: 4 — ABNORMAL LOW (ref 5–15)
BUN: 21 mg/dL — ABNORMAL HIGH (ref 6–20)
CO2: 26 mmol/L (ref 22–32)
Calcium: 9.5 mg/dL (ref 8.9–10.3)
Chloride: 109 mmol/L (ref 101–111)
Creatinine, Ser: 0.98 mg/dL (ref 0.61–1.24)
GFR calc Af Amer: >60 mL/min (ref >60)
GFR calc non Af Amer: >60 mL/min (ref >60)
Glucose, Bld: 95 mg/dL (ref 65–99)
Potassium: 4.8 mmol/L (ref 3.5–5.1)
Sodium: 139 mmol/L (ref 135–145)

## 2016-04-19 LAB — APTT: aPTT: 40 seconds — ABNORMAL HIGH (ref 24–36)

## 2016-04-19 LAB — TROPONIN I: Troponin I: <0.03 ng/mL (ref <0.03)

## 2016-04-19 MED ORDER — MORPHINE SULFATE (PF) 4 MG/ML IV SOLN
4.0000 mg | Freq: Once | INTRAVENOUS | Status: AC
  Administered 2016-04-19: 4 mg via INTRAVENOUS
  Filled 2016-04-19: qty 1

## 2016-04-19 MED ORDER — PANTOPRAZOLE SODIUM 40 MG PO TBEC
40.0000 mg | DELAYED_RELEASE_TABLET | Freq: Every day | ORAL | Status: DC | PRN
Start: 2016-04-20 — End: 2016-04-21

## 2016-04-19 MED ORDER — ONDANSETRON HCL 4 MG/2ML IJ SOLN
4.0000 mg | Freq: Four times a day (QID) | INTRAMUSCULAR | Status: DC | PRN
  Administered 2016-04-20: 4 mg via INTRAVENOUS
  Filled 2016-04-19: qty 2

## 2016-04-19 MED ORDER — CHLORDIAZEPOXIDE HCL 5 MG PO CAPS
25.0000 mg | ORAL_CAPSULE | Freq: Every day | ORAL | Status: DC | PRN
  Administered 2016-04-20: 25 mg via ORAL
  Filled 2016-04-19: qty 5

## 2016-04-19 MED ORDER — LISINOPRIL 5 MG PO TABS
5.0000 mg | ORAL_TABLET | Freq: Every day | ORAL | Status: DC
  Administered 2016-04-19 – 2016-04-20 (×2): 5 mg via ORAL
  Filled 2016-04-19 (×2): qty 1

## 2016-04-19 MED ORDER — SODIUM CHLORIDE 0.9 % IV BOLUS (SEPSIS)
500.0000 mL | Freq: Once | INTRAVENOUS | Status: AC
  Administered 2016-04-19: 500 mL via INTRAVENOUS

## 2016-04-19 MED ORDER — ASPIRIN 81 MG PO CHEW
324.0000 mg | CHEWABLE_TABLET | ORAL | Status: AC
  Filled 2016-04-19: qty 4

## 2016-04-19 MED ORDER — ONDANSETRON HCL 4 MG/2ML IJ SOLN
4.0000 mg | Freq: Once | INTRAMUSCULAR | Status: AC
  Administered 2016-04-19: 4 mg via INTRAVENOUS
  Filled 2016-04-19: qty 2

## 2016-04-19 MED ORDER — ASPIRIN EC 81 MG PO TBEC
81.0000 mg | DELAYED_RELEASE_TABLET | Freq: Every day | ORAL | Status: DC
  Administered 2016-04-20 – 2016-04-21 (×2): 81 mg via ORAL
  Filled 2016-04-19 (×2): qty 1

## 2016-04-19 MED ORDER — ACETAMINOPHEN 325 MG PO TABS: 650.0000 mg | ORAL_TABLET | ORAL | Status: DC | PRN

## 2016-04-19 MED ORDER — NITROGLYCERIN 0.4 MG SL SUBL
0.4000 mg | SUBLINGUAL_TABLET | SUBLINGUAL | Status: DC | PRN
  Filled 2016-04-19: qty 1

## 2016-04-19 MED ORDER — MORPHINE SULFATE (PF) 2 MG/ML IV SOLN
INTRAVENOUS | Status: AC
  Administered 2016-04-19: 2 mg via INTRAVENOUS
  Filled 2016-04-19: qty 1

## 2016-04-19 MED ORDER — SODIUM CHLORIDE 0.9 % IV SOLN
INTRAVENOUS | Status: DC
  Administered 2016-04-19 – 2016-04-20 (×2): via INTRAVENOUS

## 2016-04-19 MED ORDER — MORPHINE SULFATE (PF) 2 MG/ML IV SOLN
2.0000 mg | INTRAVENOUS | Status: DC | PRN
  Administered 2016-04-20 – 2016-04-21 (×3): 2 mg via INTRAVENOUS
  Filled 2016-04-19 (×3): qty 1

## 2016-04-19 MED ORDER — ASPIRIN 300 MG RE SUPP: 300.0000 mg | RECTAL | Status: AC

## 2016-04-19 MED ORDER — ZOLPIDEM TARTRATE 5 MG PO TABS
5.0000 mg | ORAL_TABLET | Freq: Every evening | ORAL | Status: DC | PRN
  Administered 2016-04-20 – 2016-04-21 (×2): 5 mg via ORAL
  Filled 2016-04-19 (×2): qty 1

## 2016-04-19 MED ORDER — CLOPIDOGREL BISULFATE 75 MG PO TABS
75.0000 mg | ORAL_TABLET | Freq: Every day | ORAL | Status: DC
  Administered 2016-04-20 – 2016-04-21 (×2): 75 mg via ORAL
  Filled 2016-04-19 (×2): qty 1

## 2016-04-19 MED ORDER — ATORVASTATIN CALCIUM 40 MG PO TABS
40.0000 mg | ORAL_TABLET | Freq: Every day | ORAL | Status: DC
  Administered 2016-04-19 – 2016-04-20 (×2): 40 mg via ORAL
  Filled 2016-04-19 (×2): qty 1

## 2016-04-19 MED ORDER — SODIUM CHLORIDE 0.9 % IV BOLUS (SEPSIS)
1000.0000 mL | Freq: Once | INTRAVENOUS | Status: AC
  Administered 2016-04-19: 1000 mL via INTRAVENOUS

## 2016-04-19 MED ORDER — HEPARIN SODIUM (PORCINE) 5000 UNIT/ML IJ SOLN
5000.0000 [IU] | Freq: Three times a day (TID) | INTRAMUSCULAR | Status: DC
  Administered 2016-04-19 – 2016-04-20 (×2): 5000 [IU] via SUBCUTANEOUS
  Filled 2016-04-19 (×3): qty 1

## 2016-04-19 NOTE — Telephone Encounter (Signed)
New message  Pt c/o Shortness Of Breath: STAT if SOB developed within the last 24 hours or pt is noticeably SOB on the phone  1. Are you currently SOB (can you hear that pt is SOB on the phone)? Yes   2. How long have you been experiencing SOB? Per pt today 8/21 after running up stairs at home   3. Are you SOB when sitting or when up moving around? Per pt when he ran up the stairs  4. Are you currently experiencing any other symptoms? Pt states he chaged his bp after going up the stairs and it was 180/140. Pt states it has been a hour and bp is still high 150/110 pulse 120, some chest pain

## 2016-04-19 NOTE — ED Notes (Signed)
Pt. Transported to xray at this time.  

## 2016-04-19 NOTE — ED Notes (Signed)
Cardiologist at bedside.  

## 2016-04-19 NOTE — ED Provider Notes (Signed)
[facility] DEPT Provider Note   CSN: [id number] Arrival date & time: 04/19/16  1412     History   Chief Complaint Chief Complaint  Patient presents with  . Chest Pain    HPI [name] is a 60 y.o. male.  HPI  Patient with STEMI s/o CABG 5 months ago, HTN, HLD, p/w chest pain.  It started about an hour when he was walking up stairs.  It was crushing, central chest, 10/10, and associated with nausea and SOB.  It improved with nitro given by EMS.  Received ASA by EMS.  Pain is now 6/10.  Otherwise has felt well before this event.  He did mention palpitations at the time of the pain, and recent palpitations in cardiac rehab.  Past Medical History:  Diagnosis Date  . Acute hepatitis C without mention of hepatic coma    Tx by [facility] for Hep.  negative for hep c 2/15.  Marland Kitchen Anxiety   . CAD (coronary artery disease)    a. 10/2015 ant STEMI >> LHC with 3 v CAD; oLAD tx with POBA >> emergent CABG.  . Chronic systolic CHF (congestive heart failure) ([place])   . Colitis   . Esophageal reflux    eosinophil esophagitis  . Former tobacco use   . Ischemic cardiomyopathy    a. EF 25-30% at intraop TEE 4/17  //  b. Limited Echo 5/17 - EF 45-50%, mild ant HK  . Sinus bradycardia    a. HR dropping into 40s in 02/2016 -> BB reduced.  . Symptomatic hypotension    a. 02/2016 ER visit -> meds reduced.    Patient Active Problem List   Diagnosis Date Noted  . Essential hypertension 02/26/2016  . Ischemic cardiomyopathy 12/25/2015  . Hyperlipidemia 12/25/2015  . STEMI (ST elevation myocardial infarction) ([place]) 11/28/2015  . Tobacco abuse 11/28/2015  . CAD (coronary artery disease), native coronary artery 11/28/2015  . 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 Acute MI anterior wall first episode care [facility])   . Chest pain 03/07/2015  . Dysphagia 03/07/2015  . Gout attack 03/07/2015  . Mixed bipolar I disorder ([place]) 03/07/2015  . Fibromyalgia 07/09/2014  . Gout 07/09/2014  . Anxiety 07/09/2014  .  Depression 07/09/2014  . Hepatitis C 11/20/2012  . Eosinophilic esophagitis 09/38/1829    Past Surgical History:  Procedure Laterality Date  . CARDIAC CATHETERIZATION N/A 11/28/2015   Procedure: Left Heart Cath and Coronary Angiography;  Surgeon: [name], MD;  Location: [place] CV LAB;  Service: Cardiovascular;  Laterality: N/A;  . CARDIAC CATHETERIZATION N/A 11/28/2015   Procedure: Coronary Balloon Angioplasty;  Surgeon: [name], MD;  Location: [place] CV LAB;  Service: Cardiovascular;  Laterality: N/A;  ostial LAD  . CARDIAC CATHETERIZATION N/A 11/28/2015   Procedure: Coronary/Graft Angiography;  Surgeon: [name], MD;  Location: [place] CV LAB;  Service: Cardiovascular;  Laterality: N/A;  coronaries only   . CORONARY ARTERY BYPASS GRAFT N/A 11/28/2015   Procedure: CORONARY ARTERY BYPASS GRAFTING (CABG) TIMES FIVE USING LEFT INTERNAL MAMMARY ARTERY AND RIGHT GREATER SAPHENOUS,VIEN HARVEATED BY ENDOVIEN, INTRAOPPRATIVE TEE;  Surgeon: Gaye Pollack, MD;  Location: [place];  Service: Open Heart Surgery;  Laterality: N/A;  . SHOULDER SURGERY         Home Medications    Prior to Admission medications   Medication Sig Start Date End Date Taking? Authorizing Provider  aspirin 81 MG EC tablet Take 1 tablet (81 mg total) by mouth daily.  12/25/15   Liliane Shi, PA-C  atorvastatin (LIPITOR) 40 MG tablet Take 1 tablet (40 mg total) by mouth daily. 02/16/16   [name], MD  chlordiazePOXIDE (LIBRIUM) 25 MG capsule Take 25 mg by mouth daily as needed for anxiety.     Historical Provider, MD  clopidogrel (PLAVIX) 75 MG tablet Take 1 tablet (75 mg total) by mouth daily. 12/25/15 12/24/16  Liliane Shi, PA-C  lisinopril (PRINIVIL,ZESTRIL) 5 MG tablet Take 1 tablet (5 mg total) by mouth daily. 03/18/16   Dayna N Dunn, PA-C  Multiple Vitamins-Minerals (ONE-A-DAY MENS 50+ ADVANTAGE PO) Take 1 tablet by mouth daily.     Historical Provider, MD    nitroGLYCERIN (NITROSTAT) 0.4 MG SL tablet Place 1 tablet (0.4 mg total) under the tongue every 5 (five) minutes as needed for chest pain. 03/18/16   Dayna N Dunn, PA-C  pantoprazole (PROTONIX) 40 MG tablet Take 40 mg by mouth daily as needed (FOR REFLUX). FOR REFLUX    Historical Provider, MD  ranitidine (ZANTAC) 150 MG tablet Take 150 mg by mouth as needed for heartburn.    Historical Provider, MD  zolpidem (AMBIEN) 10 MG tablet Take 10 mg by mouth at bedtime as needed for sleep. Reported on 02/09/2016 11/26/15   Historical Provider, MD    Family History Family History  Problem Relation Age of Onset  . Lung cancer Mother   . Heart Problems Father   . Heart attack Father 23  . Stroke Father   . Heart attack Maternal Grandmother   . Stroke Maternal Grandmother   . Heart attack Paternal Uncle   . Hypertension Brother   . Autoimmune disease Neg Hx     Social History Social History  Substance Use Topics  . Smoking status: Former Smoker    Packs/day: 0.50    Types: Cigarettes    Quit date: 11/28/2015  . Smokeless tobacco: Never Used  . Alcohol use No     Allergies   Prednisone; Tetanus toxoids; Wellbutrin [bupropion]; and Chantix [varenicline]   Review of Systems Review of Systems  Constitutional: Negative for chills and fever.  HENT: Negative for ear pain and sore throat.   Eyes: Negative for pain and visual disturbance.  Respiratory: Positive for shortness of breath. Negative for cough.   Cardiovascular: Positive for chest pain. Negative for palpitations.  Gastrointestinal: Positive for nausea. Negative for abdominal pain and vomiting.  Genitourinary: Negative for dysuria and hematuria.  Musculoskeletal: Negative for arthralgias and back pain.  Skin: Negative for color change and rash.  Neurological: Negative for seizures and syncope.  All other systems reviewed and are negative.    Physical Exam Updated Vital Signs BP 91/67 (BP Location: Right Arm)   Pulse 78    Resp 15   Wt 78.5 kg   SpO2 97%   BMI 25.55 kg/m   Physical Exam  Constitutional: He appears well-developed and well-nourished.  HENT:  Head: Normocephalic and atraumatic.  Eyes: Conjunctivae are normal.  Neck: Neck supple.  Cardiovascular: Normal rate and regular rhythm.   No murmur heard. Pulmonary/Chest: Effort normal and breath sounds normal. No respiratory distress.  Abdominal: Soft. There is no tenderness.  Musculoskeletal: He exhibits no edema.  Neurological: He is alert.  Skin: Skin is warm and dry.  Psychiatric: He has a normal mood and affect.  Nursing note and vitals reviewed.    ED Treatments / Results  Labs (all labs ordered are listed, but only abnormal results are displayed) Labs Reviewed  APTT  CBC  BASIC METABOLIC PANEL  MAGNESIUM  I-STAT TROPOININ, ED    EKG  EKG Interpretation  Date/Time:  Monday April 19 2016 14:15:28 EDT Ventricular Rate:  84 PR Interval:    QRS Duration: 95 QT Interval:  358 QTC Calculation: 424 R Axis:   86 Text Interpretation:  Sinus arrhythmia Probable left atrial enlargement Borderline right axis deviation Low voltage, precordial leads Confirmed by Alvino Chapel  MD, Ovid Curd [phone]) on 04/19/2016 2:18:25 PM       Radiology No results found.  Procedures Procedures (including critical care time)  Medications Ordered in ED Medications  sodium chloride 0.9 % bolus 1,000 mL (1,000 mLs Intravenous New Bag/Given 04/19/16 1436)    And  0.9 %  sodium chloride infusion (not administered)     Initial Impression / Assessment and Plan / ED Course  I have reviewed the triage vital signs and the nursing notes.  Pertinent labs & imaging results that were available during my care of the patient were reviewed by me and considered in my medical decision making (see chart for details).  Clinical Course    Patient with multiple cardiac risk factors, and recent CABG.  Chest pain symptoms are typical CP today.  First trop negative.   ASA given by EMS.  EKG NSR without ischemic changes.  Labs otherwise unremarkable.  Patient needs to be admitted for ACS ruleout. Cards consulted for admission.  Final Clinical Impressions(s) / ED Diagnoses   Final diagnoses:  None    New Prescriptions New Prescriptions   No medications on file     Levada Schilling, MD 04/19/16 [place], MD 04/20/16 0700

## 2016-04-19 NOTE — ED Triage Notes (Signed)
Pt. Coming from home via [facility] for chest pain after running up ~14 stairs. Pt. Hx of CABG in march. Pt. sts he got SOB, lightheaded, and nauseous. Pt. Currently doing cardiac rehab. Pt. Also sts hes been having issues with high blood pressure after exerting himself. EDP at bedside. Pt. Given ASA and nitro en route.

## 2016-04-19 NOTE — Telephone Encounter (Signed)
**Note De-Identified  Obfuscation** The pt states that he called 911 and that the ambulance is there at his house now. He is advised to follow the advise of EMS. He verbalized understanding.  Will forward message to Dr Irish Lack as Juluis Rainier.

## 2016-04-19 NOTE — H&P (Signed)
History & Physical    Patient ID: [name] MRN: [id number], DOB/AGE: 01-23-1956   Admit date: 04/19/2016   Primary Physician: [name], MD Primary Cardiologist: Dr. Irish Lack  Patient Profile    60 yo male with PMH of CAD (STEMI 10/2015 s/p POBA of LAD with IABP and subsequent emergent v5 CABG), HCV s/p treatment, prior tobacco abuse, GERD, anxiety, chronic systolic CHF (most recent EF 45-50%), tobacco abuse who presented to the ED with reports of exertional chest pain and dyspnea.   Past Medical History    Past Medical History:  Diagnosis Date  . Acute hepatitis C without mention of hepatic coma    Tx by [facility] for Hep.  negative for hep c 2/15.  Marland Kitchen Anxiety   . CAD (coronary artery disease)    a. 10/2015 ant STEMI >> LHC with 3 v CAD; oLAD tx with POBA >> emergent CABG.  . Chronic systolic CHF (congestive heart failure) ([place])   . Colitis   . Esophageal reflux    eosinophil esophagitis  . Former tobacco use   . Ischemic cardiomyopathy    a. EF 25-30% at intraop TEE 4/17  //  b. Limited Echo 5/17 - EF 45-50%, mild ant HK  . Sinus bradycardia    a. HR dropping into 40s in 02/2016 -> BB reduced.  . Symptomatic hypotension    a. 02/2016 ER visit -> meds reduced.    Past Surgical History:  Procedure Laterality Date  . CARDIAC CATHETERIZATION N/A 11/28/2015   Procedure: Left Heart Cath and Coronary Angiography;  Surgeon: [name], MD;  Location: [place] CV LAB;  Service: Cardiovascular;  Laterality: N/A;  . CARDIAC CATHETERIZATION N/A 11/28/2015   Procedure: Coronary Balloon Angioplasty;  Surgeon: [name], MD;  Location: [facility] CV LAB;  Service: Cardiovascular;  Laterality: N/A;  ostial LAD  . CARDIAC CATHETERIZATION N/A 11/28/2015   Procedure: Coronary/Graft Angiography;  Surgeon: [name], MD;  Location: [place] CV LAB;  Service: Cardiovascular;  Laterality: N/A;  coronaries only   . CORONARY ARTERY BYPASS GRAFT N/A 11/28/2015     Procedure: CORONARY ARTERY BYPASS GRAFTING (CABG) TIMES FIVE USING LEFT INTERNAL MAMMARY ARTERY AND RIGHT GREATER SAPHENOUS,VIEN HARVEATED BY ENDOVIEN, INTRAOPPRATIVE TEE;  Surgeon: Gaye Pollack, MD;  Location: [facility];  Service: Open Heart Surgery;  Laterality: N/A;  . SHOULDER SURGERY       Allergies   Allergies  Allergen Reactions  . Prednisone Other (See Comments)    States that this med makes him "crazy"  . Tetanus Toxoids Swelling and Other (See Comments)    Fever, Swelling of the arm   . Wellbutrin [Bupropion] Other (See Comments)    Crazy thoughts, nightmares  . Chantix [Varenicline] Other (See Comments)    Dreams    History of Present Illness    [name] is a 60 yo male patient of Dr. Irish Lack with PMH of CAD (STEMI 10/2015 s/p POBA of LAD with IABP and subsequent emergent v5 CABG), HCV s/p treatment, prior tobacco abuse, GERD, anxiety, chronic systolic CHF (most recent EF 45-50%), tobacco abuse. He had been seen in the pharmacist driven hypertension clinic for BP med adjustment at which they found his home blood pressure cuff was overestimating his systolic blood pressure by 5-15 points. He was then seen back in the ER due to blood pressure fluctuations, dizziness, chest discomfort and dyspnea where initial BP showed to be 95/72 which improved on the ED. Labs are unrevealing at that  time and troponin was negative. His heart rate was intermittently in the 40s and his metoprolol was reduced to avoid symptomatic hypotension. He was most recently seen in the office on 03/16/2016 where he reported feeling better overall with some occasional fatigue in mental fogginess. His blood pressure in the office was noted to be soft and at that time his lisinopril was altered and decreased to 5 mg daily.   He presented to the [facility] ED today reporting that he experienced exertional dyspnea, and squeezing chest pain after briskly walking up 14 stairs at his work. States once he got to the top  he began experiencing the symptoms along with diaphoresis. States he became concerned and ultimately called EMS for transport whenever the symptoms did not subside. Also checked his BP with his home cuff and noted 195/155. Of note reports he has been going to cardiac rehab, and reports having trouble with his heart rate elevating during his visits. States he was told at his last rehab visit that his heart rate elevated to 170, but he denies any associated symptoms.   In the ED his labs showed normal electrolytes, stable Hgb, initial trop neg, chest x-ray was neg. EKG showed ST without acute ST/T wave abnormalities. He was given one dose of morphine in the ED and now rates pain 1/10.   Home Medications    Prior to Admission medications   Medication Sig Start Date End Date Taking? Authorizing Provider  aspirin 81 MG EC tablet Take 1 tablet (81 mg total) by mouth daily. Patient taking differently: Take 81 mg by mouth every morning.  12/25/15  Yes Scott T Kathlen Mody, PA-C  atorvastatin (LIPITOR) 40 MG tablet Take 1 tablet (40 mg total) by mouth daily. Patient taking differently: Take 40 mg by mouth daily at 6 PM.  02/16/16  Yes [name], MD  chlordiazePOXIDE (LIBRIUM) 25 MG capsule Take 25 mg by mouth daily as needed for anxiety.    Yes Historical Provider, MD  clopidogrel (PLAVIX) 75 MG tablet Take 1 tablet (75 mg total) by mouth daily. Patient taking differently: Take 75 mg by mouth every morning.  12/25/15 12/24/16 Yes Scott T Weaver, PA-C  lisinopril (PRINIVIL,ZESTRIL) 5 MG tablet Take 1 tablet (5 mg total) by mouth daily. Patient taking differently: Take 5 mg by mouth every evening.  03/18/16  Yes Dayna N Dunn, PA-C  Multiple Vitamins-Minerals (ONE-A-DAY MENS 50+ ADVANTAGE PO) Take 1 tablet by mouth daily.    Yes Historical Provider, MD  nitroGLYCERIN (NITROSTAT) 0.4 MG SL tablet Place 1 tablet (0.4 mg total) under the tongue every 5 (five) minutes as needed for chest pain. 03/18/16  Yes Dayna N  Dunn, PA-C  pantoprazole (PROTONIX) 40 MG tablet Take 40 mg by mouth daily as needed (FOR REFLUX). FOR REFLUX   Yes Historical Provider, MD  ranitidine (ZANTAC) 150 MG tablet Take 150 mg by mouth as needed for heartburn.   Yes Historical Provider, MD  zolpidem (AMBIEN) 10 MG tablet Take 10 mg by mouth at bedtime as needed for sleep. Reported on 02/09/2016 11/26/15  Yes Historical Provider, MD    Family History    Family History  Problem Relation Age of Onset  . Lung cancer Mother   . Heart Problems Father   . Heart attack Father 82  . Stroke Father   . Heart attack Maternal Grandmother   . Stroke Maternal Grandmother   . Heart attack Paternal Uncle   . Hypertension Brother   . Autoimmune disease  Neg Hx     Social History    Social History   Social History  . Marital status: Married    Spouse name: [name]  . Number of children: 3  . Years of education: College   Occupational History  . Self-employed Self-Employed   Social History Main Topics  . Smoking status: Former Smoker    Packs/day: 0.50    Types: Cigarettes    Quit date: 11/28/2015  . Smokeless tobacco: Never Used  . Alcohol use No  . Drug use: No  . Sexual activity: Not on file   Other Topics Concern  . Not on file   Social History Narrative   Patient lives at home with his spouse.   Caffeine Use: yes     Review of Systems    General:  No chills, fever, night sweats or weight changes.  Cardiovascular:  See HPI Dermatological: No rash, lesions/masses Respiratory: No cough, dyspnea Urologic: No hematuria, dysuria Abdominal:   No nausea, vomiting, diarrhea, bright red blood per rectum, melena, or hematemesis Neurologic:  No visual changes, wkns, changes in mental status. All other systems reviewed and are otherwise negative except as noted above.  Physical Exam    Blood pressure 118/73, pulse 64, resp. rate 20, weight 173 lb (78.5 kg), SpO2 100 %.  General: Pleasant caucasian male , NAD Psych: Normal  affect. Neuro: Alert and oriented X 3. Moves all extremities spontaneously. HEENT: Normal  Neck: Supple without bruits or JVD. Lungs:  Resp regular and unlabored, CTA. Heart: RRR no s3, s4, or murmurs. Abdomen: Soft, non-tender, non-distended, BS + x 4.  Extremities: No clubbing, cyanosis or edema. DP/PT/Radials 2+ and equal bilaterally.  Labs    Troponin [facility] Test)  Recent Labs  04/19/16 1502  TROPIPOC 0.00   No results for input(s): CKTOTAL, CKMB, TROPONINI in the last 72 hours. Lab Results  Component Value Date   WBC 7.2 04/19/2016   HGB 13.7 04/19/2016   HCT 42.6 04/19/2016   MCV 88.9 04/19/2016   PLT 216 04/19/2016    Recent Labs Lab 04/19/16 1414  NA 139  K 4.8  CL 109  CO2 26  BUN 21*  CREATININE 0.98  CALCIUM 9.5  GLUCOSE 95   Lab Results  Component Value Date   CHOL 174 04/06/2016   HDL 50 04/06/2016   LDLCALC 109 04/06/2016   TRIG 73 04/06/2016   No results found for: [facility]   Radiology Studies    Dg Chest 2 View  Result Date: 04/19/2016 CLINICAL DATA:  Chest pain, nausea EXAM: CHEST  2 VIEW COMPARISON:  03/01/2016 FINDINGS: Cardiomediastinal silhouette is stable. Status post CABG. No acute infiltrate or pleural effusion. No pulmonary edema. Mild degenerative changes thoracic spine. IMPRESSION: No active cardiopulmonary disease. Electronically Signed   By: Lahoma Crocker M.D.   On: 04/19/2016 15:09    ECG & Cardiac Imaging    EKG: SR without any acute ST/T wave changes  Echo: 12/30/2015  Study Conclusions  - HPI and indications: Limited study. - Left ventricle: The cavity size was normal. Wall thickness was   normal. Systolic function was mildly reduced. The estimated   ejection fraction was in the range of 45% to 50%. Mild anterior   hypokinesis and incoordinate septal motion. The study is not   technically sufficient to allow evaluation of LV diastolic   function.  Impressions:  - Limited study. Compared to the most recent  echo, the LVEF has   improved to 45-50%  with residual anterior hypokinesis and   incoordinate septal motion.  Assessment & Plan    [name] is a 60 yo male patient of Dr. Irish Lack with PMH of CAD (STEMI 10/2015 s/p POBA of LAD with IABP and subsequent emergent v5 CABG), HCV s/p treatment, prior tobacco abuse, GERD, anxiety, chronic systolic CHF (most recent EF 45-50%), tobacco abuse.   1. Chest pain with high risk cardiac etiology: Presented onset of DOE and chest pain described as a squeezing sensation in  His chest after briskly walking up 14 stairs at work. Took SL nitro which briefly helped his pain, also noted his BP to be 195/155 with his home BP cuff. In the ED initial Trop neg, EKG shows SR without acute ST/T wave changes. Given one dose of morphine with improvement in pain. States today's symptoms are not similar to that experienced back in 10/2015 when he presented as a STEMI in that they are not as severe. Reports being complaint with home medications and not missing any doses.  Last 2D echo showe an improvement in EF to 45-50% with residual anterior hypokinesis and incoordinate septal motion.  -- Will admit to telemetry -- Continue ASA, Statin, Plavix -- Cycle trops, plan for exercise stress test in the morning.  2. HTN: Stable at this time, will continue home medications and adjust as needed.  3. Tobacco Use: Reports he has stopped smoking  4. Chronic CHF: Does not appear volume overloaded on exam, last 2D echo stable with improved EF.    Barnet Pall, NP-C Pager [phone] 04/19/2016, 4:48 PM

## 2016-04-19 NOTE — Telephone Encounter (Signed)
**Note De-Identified  Obfuscation** The pt states that he was unaware that his HR reached 170 bpm until a nurse at cardiac rehab told him after stopping his walk on the treadmill. He reports that he was "a little winded but not bad at all and I felt a little light headed".   The pt states that he walked 2 miles on Saturday and when he got home he checked his HR and it was 104 bpm. He reports that he had no s/s during his walk.  Also, cardiac rehab faxed over strips that were obtained at the pts cardiac rehab apt on Wednesday 8/16 when this event occurred. I have taken those strips to medical rehab and asked them to scan into the pts chart ASAP.

## 2016-04-19 NOTE — ED Notes (Signed)
Cardiology at bedside.

## 2016-04-20 ENCOUNTER — Observation Stay (HOSPITAL_COMMUNITY): Payer: BC Managed Care – PPO

## 2016-04-20 ENCOUNTER — Encounter: Payer: Self-pay | Admitting: Interventional Cardiology

## 2016-04-20 ENCOUNTER — Observation Stay (HOSPITAL_BASED_OUTPATIENT_CLINIC_OR_DEPARTMENT_OTHER): Payer: BC Managed Care – PPO

## 2016-04-20 DIAGNOSIS — R931 Abnormal findings on diagnostic imaging of heart and coronary circulation: Secondary | ICD-10-CM

## 2016-04-20 DIAGNOSIS — I25810 Atherosclerosis of coronary artery bypass graft(s) without angina pectoris: Secondary | ICD-10-CM | POA: Diagnosis not present

## 2016-04-20 DIAGNOSIS — I252 Old myocardial infarction: Secondary | ICD-10-CM | POA: Diagnosis not present

## 2016-04-20 DIAGNOSIS — K219 Gastro-esophageal reflux disease without esophagitis: Secondary | ICD-10-CM | POA: Diagnosis not present

## 2016-04-20 DIAGNOSIS — R9439 Abnormal result of other cardiovascular function study: Secondary | ICD-10-CM

## 2016-04-20 DIAGNOSIS — R079 Chest pain, unspecified: Secondary | ICD-10-CM | POA: Diagnosis not present

## 2016-04-20 DIAGNOSIS — I2109 ST elevation (STEMI) myocardial infarction involving other coronary artery of anterior wall: Secondary | ICD-10-CM | POA: Diagnosis not present

## 2016-04-20 DIAGNOSIS — I25118 Atherosclerotic heart disease of native coronary artery with other forms of angina pectoris: Secondary | ICD-10-CM | POA: Diagnosis not present

## 2016-04-20 DIAGNOSIS — I2510 Atherosclerotic heart disease of native coronary artery without angina pectoris: Secondary | ICD-10-CM | POA: Diagnosis not present

## 2016-04-20 LAB — CBC
HCT: 41.8 % (ref 39.0–52.0)
Hemoglobin: 13.3 g/dL (ref 13.0–17.0)
MCH: 29.1 pg (ref 26.0–34.0)
MCHC: 31.8 g/dL (ref 30.0–36.0)
MCV: 91.5 fL (ref 78.0–100.0)
Platelets: 159 10*3/uL (ref 150–400)
RBC: 4.57 MIL/uL (ref 4.22–5.81)
RDW: 14.6 % (ref 11.5–15.5)
WBC: 6.8 10*3/uL (ref 4.0–10.5)

## 2016-04-20 LAB — NM MYOCAR MULTI W/SPECT W/WALL MOTION / EF
Estimated workload: 7 METS
Exercise duration (min): 5 min
Exercise duration (sec): 59 s
LV dias vol: 105 mL (ref 62–150)
LV sys vol: 48 mL
MPHR: 159 {beats}/min
Peak HR: 155 {beats}/min
Percent HR: 97 %
RATE: 0.54
Rest HR: 60 {beats}/min
SDS: 1
SRS: 9
SSS: 10
TID: 1.3

## 2016-04-20 LAB — BASIC METABOLIC PANEL
Anion gap: 5 (ref 5–15)
BUN: 16 mg/dL (ref 6–20)
CO2: 30 mmol/L (ref 22–32)
Calcium: 9.6 mg/dL (ref 8.9–10.3)
Chloride: 107 mmol/L (ref 101–111)
Creatinine, Ser: 1.18 mg/dL (ref 0.61–1.24)
GFR calc Af Amer: >60 mL/min (ref >60)
GFR calc non Af Amer: >60 mL/min (ref >60)
Glucose, Bld: 93 mg/dL (ref 65–99)
Potassium: 4.7 mmol/L (ref 3.5–5.1)
Sodium: 142 mmol/L (ref 135–145)

## 2016-04-20 LAB — TROPONIN I
Troponin I: <0.03 ng/mL (ref <0.03)
Troponin I: <0.03 ng/mL (ref <0.03)

## 2016-04-20 MED ORDER — SODIUM CHLORIDE 0.9 % IV SOLN: 250.0000 mL | INTRAVENOUS | Status: DC | PRN

## 2016-04-20 MED ORDER — SODIUM CHLORIDE 0.9 % WEIGHT BASED INFUSION
3.0000 mL/kg/h | INTRAVENOUS | Status: DC
  Administered 2016-04-21: 3 mL/kg/h via INTRAVENOUS

## 2016-04-20 MED ORDER — LORAZEPAM 1 MG PO TABS: 1.0000 mg | ORAL_TABLET | Freq: Every day | ORAL | Status: DC

## 2016-04-20 MED ORDER — SODIUM CHLORIDE 0.9% FLUSH: 3.0000 mL | INTRAVENOUS | Status: DC | PRN

## 2016-04-20 MED ORDER — SODIUM CHLORIDE 0.9% FLUSH
3.0000 mL | Freq: Two times a day (BID) | INTRAVENOUS | Status: DC
  Administered 2016-04-20: 3 mL via INTRAVENOUS

## 2016-04-20 MED ORDER — TECHNETIUM TC 99M TETROFOSMIN IV KIT: 10.0000 | PACK | Freq: Once | INTRAVENOUS | Status: DC | PRN

## 2016-04-20 MED ORDER — SODIUM CHLORIDE 0.9 % WEIGHT BASED INFUSION: 1.0000 mL/kg/h | INTRAVENOUS | Status: DC

## 2016-04-20 MED ORDER — TECHNETIUM TC 99M TETROFOSMIN IV KIT: 30.0000 | PACK | Freq: Once | INTRAVENOUS | Status: DC | PRN

## 2016-04-20 MED ORDER — ASPIRIN 81 MG PO CHEW
81.0000 mg | CHEWABLE_TABLET | ORAL | Status: AC
  Administered 2016-04-21: 81 mg via ORAL
  Filled 2016-04-20: qty 1

## 2016-04-20 NOTE — Progress Notes (Signed)
Patient Name: [name] Date of Encounter: 04/20/2016  Hospital Problem List     Active Problems:   Chest pain with high risk for cardiac etiology    Subjective   No cp.  Inpatient Medications    . aspirin  324 mg Oral NOW   Or  . aspirin  300 mg Rectal NOW  . aspirin EC  81 mg Oral Daily  . atorvastatin  40 mg Oral q1800  . clopidogrel  75 mg Oral Daily  . heparin  5,000 Units Subcutaneous Q8H  . lisinopril  5 mg Oral Daily    Vital Signs    Vitals:   04/20/16 0715 04/20/16 1023 04/20/16 1027 04/20/16 1030  BP: 114/72 110/70 122/66 139/68  Pulse:      Resp:      Temp:      TempSrc:      SpO2: 100%     Weight:      Height:        Intake/Output Summary (Last 24 hours) at 04/20/16 1032 Last data filed at 04/20/16 0800  Gross per 24 hour  Intake             1980 ml  Output                1 ml  Net             1979 ml   Filed Weights   04/19/16 1421 04/19/16 1849 04/20/16 0500  Weight: 173 lb (78.5 kg) 177 lb 8 oz (80.5 kg) 175 lb (79.4 kg)    Physical Exam    General: Pleasant, NAD. Neuro: Alert and oriented X 3. Moves all extremities spontaneously. Psych: Normal affect. HEENT:  Normal  Neck: Supple without bruits or JVD. Lungs:  Resp regular and unlabored, CTA. Heart: RRR no s3, s4, or murmurs. Abdomen: Soft, non-tender, non-distended, BS + x 4.  Extremities: No clubbing, cyanosis or edema. DP/PT/Radials 2+ and equal bilaterally.  Labs    CBC  Recent Labs  04/19/16 1414 04/20/16 0715  WBC 7.2 6.8  HGB 13.7 13.3  HCT 42.6 41.8  MCV 88.9 91.5  PLT 216 657   Basic Metabolic Panel  Recent Labs  04/19/16 1414 04/20/16 0715  NA 139 142  K 4.8 4.7  CL 109 107  CO2 26 30  GLUCOSE 95 93  BUN 21* 16  CREATININE 0.98 1.18  CALCIUM 9.5 9.6  MG 2.1  --    Liver Function Tests No results for input(s): AST, ALT, ALKPHOS, BILITOT, PROT, ALBUMIN in the last 72 hours. No results for input(s): LIPASE, AMYLASE in the last 72  hours. Cardiac Enzymes  Recent Labs  04/19/16 1917 04/20/16 0048 04/20/16 0715  TROPONINI <0.03 <0.03 <0.03   BNP Invalid input(s): POCBNP D-Dimer No results for input(s): DDIMER in the last 72 hours. Hemoglobin A1C No results for input(s): HGBA1C in the last 72 hours. Fasting Lipid Panel No results for input(s): CHOL, HDL, LDLCALC, TRIG, CHOLHDL, LDLDIRECT in the last 72 hours. Thyroid Function Tests No results for input(s): TSH, T4TOTAL, T3FREE, THYROIDAB in the last 72 hours.  Invalid input(s): FREET3  Telemetry    SR  ECG    No morning.  Radiology    Dg Chest 2 View  Result Date: 04/19/2016 CLINICAL DATA:  Chest pain, nausea EXAM: CHEST  2 VIEW COMPARISON:  03/01/2016 FINDINGS: Cardiomediastinal silhouette is stable. Status post CABG. No acute infiltrate or pleural effusion. No pulmonary edema. Mild degenerative changes thoracic  spine. IMPRESSION: No active cardiopulmonary disease. Electronically Signed   By: Lahoma Crocker M.D.   On: 04/19/2016 15:09    Assessment & Plan    60 yo male with PMH of CAD (STEMI 10/2015 s/p POBA of LAD with IABP and subsequent emergent [id number]), HCV s/p treatment, prior tobacco abuse, GERD, anxiety, chronic systolic CHF (most recent EF 45-50%), tobacco abuse who presented to the ED with report of a single episode of severe left-sided chest discomfort after running up a flight of steps.  1. Chest pain with high risk cardiac etiology: Presented onset of DOE and chest pain described as a squeezing sensation in  His chest after briskly walking up 14 stairs at work. Took SL nitro which briefly helped his pain, also noted his BP to be 195/155 with his home BP cuff. In the ED initial Trop neg, EKG shows SR without acute ST/T wave changes. Given one dose of morphine with improvement in pain. States today's symptoms are not similar to that experienced back in 10/2015 when he presented as a STEMI in that they are not as severe. Reports being complaint with  home medications and not missing any doses.  Last 2D echo showe an improvement in EF to 45-50% with residual anterior hypokinesis and incoordinate septal motion.  -- Trops cycled and neg -- Presented to [facility] for exercise stress test. Images to follow  2. HTN: Stable at this time, will continue home medications and adjust as needed.  3. Tobacco Use: Reports he has stopped smoking  4. Chronic CHF: Does not appear volume overloaded on exam, last 2D echo stable with improved EF.    Signed, Anthony Bellis NP-C Pager [phone]   I have seen, examined and evaluated the patient this PM along with Ms. Anthony Bale, NP-C.  After reviewing all the available data and chart, we discussed the patients laboratory, study & physical findings as well as symptoms in detail. I agree with her findings, examination as well as impression recommendations as per our discussion.   He ruled out for MI, and went for stress test today. I waited till the stress test was done,.  Did have an episode of discomfort last night, but no more pain today. He did well on the stress test. However now the report is reviewed and returned suggesting what I initially thought was a fixed apical defect consistent with his known anterior MI. However is now being read as high risk study with TID, but possible diaphragmatic attenuation. Unfortunately, with a high risk study and a clear defect in a patient who is having symptoms, I feel obligated to exclude true ischemic findings as suggested by this report.  He does have labile blood pressures with pressures anywhere from the 160s to 90s, which limits our ability to titrate his antianginal medications. He had no symptoms on the stress test treadmill.  Plan: Based on abnormal nuclear study, would recommend relook catheterization to exclude potential graft disease 5 months out from CABG.     [name], M.D., M.S. Interventional Cardiologist   Pager # [phone] Phone #  [phone] [address]. [place], [place] [address]

## 2016-04-20 NOTE — Progress Notes (Signed)
Patient called for RN.  RN into room and patient stated he had lied earlier to RN about his chest pain.  Patient stated it was worse than what he had originally told RN but did not want to upset his wife (who was at bedside at the time) and that's why he lied.  RN educated patient on the importance of being honest when he had chest pain and when rating it  Patient stated understanding.  EKG completed.  RN offered PRN nitro and patient stated it will only work for about two minutes and the pain comes right back.  Per patient the PRN Morphine controls pain longer.  PRN Morphine administered per order (see MAR for detailed information).  After Morphine administered patient stated he was going to be starving tomorrow before he was able to eat again.  RN offered snack and patient requested graham crackers and an orange sherbet.  RN provided requested snack.  Will continue to monitor.

## 2016-04-20 NOTE — Progress Notes (Signed)
Pt's b/p is in the 80's. Paged MD Hochrien that advised if not symptomatic to make no changes. Will continue to monitor.

## 2016-04-21 ENCOUNTER — Encounter (HOSPITAL_COMMUNITY): Payer: Self-pay | Admitting: Cardiovascular Disease

## 2016-04-21 ENCOUNTER — Other Ambulatory Visit: Payer: Self-pay | Admitting: Cardiology

## 2016-04-21 ENCOUNTER — Encounter (HOSPITAL_COMMUNITY)
Admission: EM | Disposition: A | Discharge status: Home / Self Care | Payer: Self-pay | Source: Home / Self Care | Attending: Emergency Medicine

## 2016-04-21 ENCOUNTER — Encounter (HOSPITAL_COMMUNITY): Payer: BC Managed Care – PPO

## 2016-04-21 DIAGNOSIS — R931 Abnormal findings on diagnostic imaging of heart and coronary circulation: Secondary | ICD-10-CM | POA: Diagnosis not present

## 2016-04-21 DIAGNOSIS — I252 Old myocardial infarction: Secondary | ICD-10-CM | POA: Diagnosis not present

## 2016-04-21 DIAGNOSIS — I2510 Atherosclerotic heart disease of native coronary artery without angina pectoris: Secondary | ICD-10-CM | POA: Diagnosis not present

## 2016-04-21 DIAGNOSIS — I471 Supraventricular tachycardia: Secondary | ICD-10-CM

## 2016-04-21 DIAGNOSIS — R0989 Other specified symptoms and signs involving the circulatory and respiratory systems: Secondary | ICD-10-CM | POA: Diagnosis not present

## 2016-04-21 DIAGNOSIS — I25810 Atherosclerosis of coronary artery bypass graft(s) without angina pectoris: Secondary | ICD-10-CM | POA: Diagnosis not present

## 2016-04-21 DIAGNOSIS — Z9861 Coronary angioplasty status: Secondary | ICD-10-CM

## 2016-04-21 DIAGNOSIS — R079 Chest pain, unspecified: Secondary | ICD-10-CM | POA: Diagnosis not present

## 2016-04-21 DIAGNOSIS — K219 Gastro-esophageal reflux disease without esophagitis: Secondary | ICD-10-CM | POA: Diagnosis not present

## 2016-04-21 DIAGNOSIS — R030 Elevated blood-pressure reading, without diagnosis of hypertension: Secondary | ICD-10-CM

## 2016-04-21 HISTORY — PX: CARDIAC CATHETERIZATION: SHX172

## 2016-04-21 LAB — PROTIME-INR
INR: 0.98
Prothrombin Time: 13 seconds (ref 11.4–15.2)

## 2016-04-21 LAB — BASIC METABOLIC PANEL
Anion gap: 6 (ref 5–15)
BUN: 14 mg/dL (ref 6–20)
CO2: 29 mmol/L (ref 22–32)
Calcium: 9.5 mg/dL (ref 8.9–10.3)
Chloride: 106 mmol/L (ref 101–111)
Creatinine, Ser: 1.13 mg/dL (ref 0.61–1.24)
GFR calc Af Amer: >60 mL/min (ref >60)
GFR calc non Af Amer: >60 mL/min (ref >60)
Glucose, Bld: 98 mg/dL (ref 65–99)
Potassium: 3.9 mmol/L (ref 3.5–5.1)
Sodium: 141 mmol/L (ref 135–145)

## 2016-04-21 SURGERY — LEFT HEART CATH AND CORONARY ANGIOGRAPHY
Anesthesia: LOCAL

## 2016-04-21 MED ORDER — LIDOCAINE HCL (PF) 1 % IJ SOLN
INTRAMUSCULAR | Status: DC | PRN
  Administered 2016-04-21: 25 mL via SUBCUTANEOUS

## 2016-04-21 MED ORDER — IOPAMIDOL (ISOVUE-370) INJECTION 76%
INTRAVENOUS | Status: AC
  Filled 2016-04-21: qty 125

## 2016-04-21 MED ORDER — SODIUM CHLORIDE 0.9 % WEIGHT BASED INFUSION: 1.0000 mL/kg/h | INTRAVENOUS | Status: AC

## 2016-04-21 MED ORDER — HEPARIN (PORCINE) IN NACL 2-0.9 UNIT/ML-% IJ SOLN
INTRAMUSCULAR | Status: DC | PRN
  Administered 2016-04-21: 1000 mL

## 2016-04-21 MED ORDER — LIDOCAINE HCL (PF) 1 % IJ SOLN
INTRAMUSCULAR | Status: AC
  Filled 2016-04-21: qty 30

## 2016-04-21 MED ORDER — MIDAZOLAM HCL 2 MG/2ML IJ SOLN
INTRAMUSCULAR | Status: AC
  Filled 2016-04-21: qty 2

## 2016-04-21 MED ORDER — IOPAMIDOL (ISOVUE-370) INJECTION 76%
INTRAVENOUS | Status: DC | PRN
  Administered 2016-04-21: 105 mL

## 2016-04-21 MED ORDER — SODIUM CHLORIDE 0.9 % IV SOLN: 250.0000 mL | INTRAVENOUS | Status: DC | PRN

## 2016-04-21 MED ORDER — MIDAZOLAM HCL 2 MG/2ML IJ SOLN
INTRAMUSCULAR | Status: DC | PRN
  Administered 2016-04-21: 1 mg via INTRAVENOUS
  Administered 2016-04-21: 2 mg via INTRAVENOUS

## 2016-04-21 MED ORDER — FENTANYL CITRATE (PF) 100 MCG/2ML IJ SOLN
INTRAMUSCULAR | Status: DC | PRN
  Administered 2016-04-21: 50 ug via INTRAVENOUS

## 2016-04-21 MED ORDER — HEPARIN (PORCINE) IN NACL 2-0.9 UNIT/ML-% IJ SOLN
INTRAMUSCULAR | Status: AC
  Filled 2016-04-21: qty 1000

## 2016-04-21 MED ORDER — SODIUM CHLORIDE 0.9% FLUSH: 3.0000 mL | Freq: Two times a day (BID) | INTRAVENOUS | Status: DC

## 2016-04-21 MED ORDER — FENTANYL CITRATE (PF) 100 MCG/2ML IJ SOLN
INTRAMUSCULAR | Status: AC
  Filled 2016-04-21: qty 2

## 2016-04-21 MED ORDER — SODIUM CHLORIDE 0.9% FLUSH
3.0000 mL | INTRAVENOUS | Status: DC | PRN
Start: 2016-04-21 — End: 2016-04-21

## 2016-04-21 SURGICAL SUPPLY — 9 items
CATH INFINITI 5FR MULTPACK ANG (CATHETERS) ×2 IMPLANT
KIT HEART LEFT (KITS) ×2 IMPLANT
PACK CARDIAC CATHETERIZATION (CUSTOM PROCEDURE TRAY) ×2 IMPLANT
SHEATH PINNACLE 5F 10CM (SHEATH) ×2 IMPLANT
SYR MEDRAD MARK V 150ML (SYRINGE) ×2 IMPLANT
TRANSDUCER W/STOPCOCK (MISCELLANEOUS) ×2 IMPLANT
TUBING CIL FLEX 10 FLL-RA (TUBING) ×2 IMPLANT
WIRE EMERALD 3MM-J .035X150CM (WIRE) ×2 IMPLANT
WIRE EMERALD ST .035X150CM (WIRE) IMPLANT

## 2016-04-21 NOTE — Progress Notes (Signed)
Subjective:  Pt. Was feeling better, he had his Cardiac cath today. No complaints.  Objective:  Vital signs in last 24 hours: Vitals:   04/21/16 0950 04/21/16 0955 04/21/16 1000 04/21/16 1005  BP: 119/66 112/69 123/69 121/64  Pulse: 60 (!) 58 (!) 58 60  Resp: 10 10 (!) 22 15  Temp:      TempSrc:      SpO2: 98% 99% 99% 99%  Weight:      Height:       General: Pleasant, NAD.Cath. Site looks ok. Neuro: Alert and oriented X 3. Moves all extremities spontaneously. Psych: Normal affect. HEENT:  Normal                    Neck: Supple without bruits or JVD. Lungs:  Resp regular and unlabored, CTA. Heart: RRR no s3, s4, or murmurs. Abdomen: Soft, non-tender, non-distended, BS + x 4.  Extremities: No clubbing, cyanosis or edema. DP/PT/Radials 2+ and equal bilaterally.  NM Myocar Multi W/Spect W/Wall Motion / EF  Study Result    Blood pressure demonstrated a normal response to exercise.  There was no ST segment deviation noted during stress.  No T wave inversion was noted during stress.  There is a medium defect of moderate severity present in the mid inferior, apical inferior, apical lateral and apex location. The defect is non-reversible. This is most consistent with diaphragmatic attenuation artifact and significant extracardiac activity. No ischemia noted.  This is a high risk study due to evidence of transient ischemic dilatation (TID1.30) which could indicate underlying multivessel CAD.  Nuclear stress EF: 54%.  The left ventricular ejection fraction is mildly decreased (45-54%).   Left Heart Cath and Coronary Angiography: Conclusion     Prox RCA lesion, 80 %stenosed.  Ost Cx lesion, 90 %stenosed.  Prox Cx lesion, 50 %stenosed.  Ost LAD to Prox LAD lesion, 95 %stenosed.  Ost 1st Diag to 1st Diag lesion, 90 %stenosed.  Mid LAD to Dist LAD lesion, 80 %stenosed.  Mid RCA lesion, 20 %stenosed.  Dist RCA lesion, 40 %stenosed.  LM lesion, 90  %stenosed.  SVG.  Origin to Prox Graft lesion, 40 %stenosed.  SVG.  Origin lesion, 100 %stenosed.  Origin to Prox Graft lesion, 40 %stenosed.  Mid Graft to Dist Graft lesion, 50 %stenosed.  And is anatomically normal.  The left ventricular ejection fraction is 50-55% by visual estimate.  The left ventricular systolic function is normal.  LV end diastolic pressure is mildly elevated.   1. Severe underlying three-vessel coronary artery disease with patent grafts except SVG to diagonal. Patent LIMA to LAD, sequential SVG to OM 3/ramus and SVG to right PDA. The diagonals are supplied by retrograde flow from the LAD. Only first diagonal is not supplied. The vein grafts have moderate ectasia with areas of narrowing in between that did not seem to be obstructive.  2. Low normal LV systolic function with an EF of 50-55% with mildly elevated left ventricular end-diastolic pressure.  Recommendations: Aggressive medical therapy. No revascularization is advised.       Assessment/Plan: 60 yo male with PMH of CAD (STEMI 10/2015 s/p POBA of LAD with IABP and subsequent emergent v5CABG), HCV s/p treatment, prior tobacco abuse, GERD, anxiety, chronic systolic CHF (most recent EF 45-50%), tobacco abuse who presented to the ED with report of a single episode of severe left-sided chest discomfort after running up a flight of steps.   Chest pain with high risk cardiac etiology: Due to his  PMH and questionable stress test, he had his left cardiac cath. Today. Results are above. No intervention was advised at this point ,Medical management.His EF has improved to 50-55%. -Can be d/c with follow up with cardiology.  HTN: Stable at this time, will continue home medications and adjust as needed.  Dispo: Anticipated discharge in approximately 1 day(s).   Lorella Nimrod, MD 04/21/2016, 2:18 PM Pager: [phone]

## 2016-04-21 NOTE — Discharge Summary (Signed)
Discharge Summary    Patient ID: [name],  MRN: [id number], DOB/AGE: September 26, 1955 60 y.o.  Admit date: 04/19/2016 Discharge date: 04/21/2016  Primary Care Provider: [name] Primary Cardiologist: Dr. Irish Lack  Discharge Diagnoses    Active Problems:   Chest pain with high risk for cardiac etiology   Abnormal nuclear stress test - HIGH RISK   ST elevation myocardial infarction (STEMI) of anterior wall, subsequent episode of care [facility])   Allergies Allergies  Allergen Reactions  . Prednisone Other (See Comments)    States that this med makes him "crazy"  . Tetanus Toxoids Swelling and Other (See Comments)    Fever, Swelling of the arm   . Wellbutrin [Bupropion] Other (See Comments)    Crazy thoughts, nightmares  . Chantix [Varenicline] Other (See Comments)    Dreams    Diagnostic Studies/Procedures  Exercise Myoview 04/20/16  Blood pressure demonstrated a normal response to exercise.  There was no ST segment deviation noted during stress.  No T wave inversion was noted during stress.  There is a medium defect of moderate severity present in the mid inferior, apical inferior, apical lateral and apex location. The defect is non-reversible. This is most consistent with diaphragmatic attenuation artifact and significant extracardiac activity. No ischemia noted.  This is a high risk study due to evidence of transient ischemic dilatation (TID1.30) which could indicate underlying multivessel CAD.  Nuclear stress EF: 54%.  The left ventricular ejection fraction is mildly decreased (45-54%).   Left Heart Cath and Coronary Angiography 04/21/16  Prox RCA lesion, 80 %stenosed.  Ost Cx lesion, 90 %stenosed.  Prox Cx lesion, 50 %stenosed.  Ost LAD to Prox LAD lesion, 95 %stenosed.  Ost 1st Diag to 1st Diag lesion, 90 %stenosed.  Mid LAD to Dist LAD lesion, 80 %stenosed.  Mid RCA lesion, 20 %stenosed.  Dist RCA lesion, 40 %stenosed.  LM lesion, 90  %stenosed.  SVG.  Origin to Prox Graft lesion, 40 %stenosed.  SVG.  Origin lesion, 100 %stenosed.  Origin to Prox Graft lesion, 40 %stenosed.  Mid Graft to Dist Graft lesion, 50 %stenosed.  And is anatomically normal.  The left ventricular ejection fraction is 50-55% by visual estimate.  The left ventricular systolic function is normal.  LV end diastolic pressure is mildly elevated.   1. Severe underlying three-vessel coronary artery disease with patent grafts except SVG to diagonal. Patent LIMA to LAD, sequential SVG to OM 3/ramus and SVG to right PDA. The diagonals are supplied by retrograde flow from the LAD. Only first diagonal is not supplied. The vein grafts have moderate ectasia with areas of narrowing in between that did not seem to be obstructive.  2. Low normal LV systolic function with an EF of 50-55% with mildly elevated left ventricular end-diastolic pressure.  Recommendations: Aggressive medical therapy. No revascularization is advised.   _____________   History of Present Illness   [name] is a 60 yo male patient of Dr. Irish Lack with PMH of CAD (STEMI 10/2015 s/p POBA of LAD with IABP and subsequent emergent v5 CABG), HCV s/p treatment, prior tobacco abuse, GERD, anxiety, chronic systolic CHF (most recent EF 45-50%),and tobacco abuse.   He presented to the [facility] ED on 04/20/16, reporting that he experienced exertional dyspnea, and squeezing chest pain after briskly walking up 14 stairs at his work. States once he got to the top he began experiencing the symptoms along with diaphoresis. He became concerned and ultimately called EMS for transport whenever the  symptoms did not subside. Also, checked his BP with his home cuff and noted 195/155. Of note, he reports he has been going to cardiac rehab, and  having trouble with his heart rate elevating during his visits. He was told at his last rehab visit that his heart rate elevated to 170, but he denies any  associated symptoms.   In the ED his labs showed normal electrolytes, stable Hgb, initial trop neg, chest x-ray was neg. EKG showed ST without acute ST/T wave abnormalities.   Hospital Course  He was admitted for further observation with plans for exercise myoview. His troponin was negative x3.   Exercise stress test showed "a medium defect of moderate severity present in the mid inferior, apical inferior, apical lateral and apex location. The defect is non-reversible. This is most consistent with diaphragmatic attenuation artifact and significant extracardiac activity. No ischemia noted". Full report above.   He was sent for left heart cath on 04/21/16, full report above. He has severe underlying three-vessel coronary artery disease with patent grafts except SVG to diagonal. Patent LIMA to LAD, sequential SVG to OM 3/ramus and SVG to right PDA. The diagonals are supplied by retrograde flow from the LAD. Only first diagonal is not supplied. The vein grafts have moderate ectasia with areas of narrowing in between that did not seem to be obstructive.  Medical therapy is recommended. His BP is labile, and he has blood pressures in the 160's to 90's, this makes titrating his anti-anginal meds very difficult. He is also not on a beta blocker due to bradycardia. Consider adding Ranexa to his regimen outpatient for angina.   He is on aspirin and Plavix, will continue this. Also on low dose ACE-I as his EF is reduced at 45-50%.   His right groin was stable with no hematoma. He was seen today by Dr. Ellyn Hack and deemed suitable for discharge.   _____________  Discharge Vitals Blood pressure 121/64, pulse 60, temperature 97.7 F (36.5 C), temperature source Oral, resp. rate 15, height '5\' 9"'$  (1.753 m), weight 172 lb (78 kg), SpO2 99 %.  Filed Weights   04/19/16 1849 04/20/16 0500 04/21/16 0500  Weight: 177 lb 8 oz (80.5 kg) 175 lb (79.4 kg) 172 lb (78 kg)    Labs & Radiologic Studies      CBC  Recent Labs  04/19/16 1414 04/20/16 0715  WBC 7.2 6.8  HGB 13.7 13.3  HCT 42.6 41.8  MCV 88.9 91.5  PLT 216 979   Basic Metabolic Panel  Recent Labs  04/19/16 1414 04/20/16 0715 04/21/16 0355  NA 139 142 141  K 4.8 4.7 3.9  CL 109 107 106  CO2 '26 30 29  '$ GLUCOSE 95 93 98  BUN 21* 16 14  CREATININE 0.98 1.18 1.13  CALCIUM 9.5 9.6 9.5  MG 2.1  --   --    Cardiac Enzymes  Recent Labs  04/19/16 1917 04/20/16 0048 04/20/16 0715  TROPONINI <0.03 <0.03 <0.03    Dg Chest 2 View  Result Date: 04/19/2016 CLINICAL DATA:  Chest pain, nausea EXAM: CHEST  2 VIEW COMPARISON:  03/01/2016 FINDINGS: Cardiomediastinal silhouette is stable. Status post CABG. No acute infiltrate or pleural effusion. No pulmonary edema. Mild degenerative changes thoracic spine. IMPRESSION: No active cardiopulmonary disease. Electronically Signed   By: Lahoma Crocker M.D.   On: 04/19/2016 15:09   Nm Myocar Multi W/spect W/wall Motion / Ef  Result Date: 04/20/2016  Blood pressure demonstrated a normal response to exercise.  There was no ST segment deviation noted during stress.  No T wave inversion was noted during stress.  There is a medium defect of moderate severity present in the mid inferior, apical inferior, apical lateral and apex location. The defect is non-reversible. This is most consistent with diaphragmatic attenuation artifact and significant extracardiac activity. No ischemia noted.  This is a high risk study due to evidence of transient ischemic dilatation (TID1.30) which could indicate underlying multivessel CAD.  Nuclear stress EF: 54%.  The left ventricular ejection fraction is mildly decreased (45-54%).     Disposition   Pt is being discharged home today in good condition.  Follow-up Plans & Appointments    Follow-up Information    [place] [facility] .   Specialty:  Cardiology Why:  Our office will call to arrange set up for 30 day heart monitor.  Contact  information: [address], [place]       Larae Grooms, MD Follow up on 05/18/2016.   Specialties:  Cardiology, Radiology, Interventional Cardiology Why:  at 9:30 am for follow up Contact information: [address] [address] [phone]          Discharge Instructions    Diet - low sodium heart healthy    Complete by:  As directed   Discharge instructions    Complete by:  As directed   Dr. Ellyn Hack would like for you to wear a heart monitor for 30 days, our office will call you to schedule this.   Increase activity slowly    Complete by:  As directed      Discharge Medications   Current Discharge Medication List    CONTINUE these medications which have NOT CHANGED   Details  aspirin 81 MG EC tablet Take 1 tablet (81 mg total) by mouth daily.   Associated Diagnoses: Coronary artery disease involving native coronary artery of native heart without angina pectoris; Ischemic cardiomyopathy    atorvastatin (LIPITOR) 40 MG tablet Take 1 tablet (40 mg total) by mouth daily. Qty: 30 tablet, Refills: 6    chlordiazePOXIDE (LIBRIUM) 25 MG capsule Take 25 mg by mouth daily as needed for anxiety.     clopidogrel (PLAVIX) 75 MG tablet Take 1 tablet (75 mg total) by mouth daily. Qty: 30 tablet, Refills: 11   Associated Diagnoses: Coronary artery disease involving native coronary artery of native heart without angina pectoris; Ischemic cardiomyopathy    lisinopril (PRINIVIL,ZESTRIL) 5 MG tablet Take 1 tablet (5 mg total) by mouth daily. Qty: 90 tablet, Refills: 3    Multiple Vitamins-Minerals (ONE-A-DAY MENS 50+ ADVANTAGE PO) Take 1 tablet by mouth daily.     nitroGLYCERIN (NITROSTAT) 0.4 MG SL tablet Place 1 tablet (0.4 mg total) under the tongue every 5 (five) minutes as needed for chest pain. Qty: 25 tablet, Refills: 3    pantoprazole (PROTONIX) 40 MG tablet Take 40 mg by mouth daily as  needed (FOR REFLUX). FOR REFLUX    ranitidine (ZANTAC) 150 MG tablet Take 150 mg by mouth as needed for heartburn.    zolpidem (AMBIEN) 10 MG tablet Take 10 mg by mouth at bedtime as needed for sleep. Reported on 02/09/2016            Outstanding Labs/Studies  30 day event monitor- our office will call to arrange.   Duration of Discharge Encounter   Greater than 30 minutes including physician time.  Signed, [name] NP  04/21/2016, 5:19 PM   Mr. Tonia Brooms has had a heart catheterization today. There was an occluded graft to diagonal that is actually filling retrograde via flow from the LIMA to the LAD. Otherwise moderate disease in both remaining vein grafts. No PCI target. Nothing to explain a "high risk stress test ". I think this is probably more related to a known anterior perfusion defect from his prior anterior STEMI.  He still having episodes of having heart rate going up into the high 150-1 80 bpm range per his report. I would like to have him wear a 30 day event monitor to determine if there is indeed any arrhythmia.  Otherwise, I would not necessarily change his management at this point in time. We may need to adjust the dosing time for different medications to avoid alterations and labile blood pressure, however the I have not seen the blood pressure being labile today is was yesterday  Stable for discharge today. Will need follow-up with his primary cardiologist.   Glenetta Hew, M.D., M.S. Interventional Cardiologist   Pager # [phone] Phone # [phone] [address] [place], [place] [address]

## 2016-04-21 NOTE — Interval H&P Note (Signed)
Cath Lab Visit (complete for each Cath Lab visit)  Clinical Evaluation Leading to the Procedure:   ACS: No.  Non-ACS:    Anginal Classification: CCS III  Anti-ischemic medical therapy: Maximal Therapy (2 or more classes of medications)  Non-Invasive Test Results: High-risk stress test findings: cardiac mortality >3%/year  Prior CABG: Previous CABG      History and Physical Interval Note:  04/21/2016 8:37 AM  [name]  has presented today for surgery, with the diagnosis of abnormal nuc  The various methods of treatment have been discussed with the patient and family. After consideration of risks, benefits and other options for treatment, the patient has consented to  Procedure(s): Left Heart Cath and Coronary Angiography (N/A) as a surgical intervention .  The patient's history has been reviewed, patient examined, no change in status, stable for surgery.  I have reviewed the patient's chart and labs.  Questions were answered to the patient's satisfaction.     Kathlyn [place]

## 2016-04-21 NOTE — Progress Notes (Addendum)
Site area: RFA Site Prior to Removal:  Level 1 Pressure Applied For:35 min Manual: yes   Patient Status During Pull: stable  Post Pull Site:  Level 0 Post Pull Instructions Given: yes  Post Pull Pulses Present: palpable Dressing Applied:  tegaderm Bedrest begins @ 1005 [phone] Comments:area hard to palpation above insertion site pre removal

## 2016-04-21 NOTE — H&P (View-Only) (Signed)
Patient Name: [name] Date of Encounter: 04/20/2016  Hospital Problem List     Active Problems:   Chest pain with high risk for cardiac etiology    Subjective   No cp.  Inpatient Medications    . aspirin  324 mg Oral NOW   Or  . aspirin  300 mg Rectal NOW  . aspirin EC  81 mg Oral Daily  . atorvastatin  40 mg Oral q1800  . clopidogrel  75 mg Oral Daily  . heparin  5,000 Units Subcutaneous Q8H  . lisinopril  5 mg Oral Daily    Vital Signs    Vitals:   04/20/16 0715 04/20/16 1023 04/20/16 1027 04/20/16 1030  BP: 114/72 110/70 122/66 139/68  Pulse:      Resp:      Temp:      TempSrc:      SpO2: 100%     Weight:      Height:        Intake/Output Summary (Last 24 hours) at 04/20/16 1032 Last data filed at 04/20/16 0800  Gross per 24 hour  Intake             1980 ml  Output                1 ml  Net             1979 ml   Filed Weights   04/19/16 1421 04/19/16 1849 04/20/16 0500  Weight: 173 lb (78.5 kg) 177 lb 8 oz (80.5 kg) 175 lb (79.4 kg)    Physical Exam    General: Pleasant, NAD. Neuro: Alert and oriented X 3. Moves all extremities spontaneously. Psych: Normal affect. HEENT:  Normal  Neck: Supple without bruits or JVD. Lungs:  Resp regular and unlabored, CTA. Heart: RRR no s3, s4, or murmurs. Abdomen: Soft, non-tender, non-distended, BS + x 4.  Extremities: No clubbing, cyanosis or edema. DP/PT/Radials 2+ and equal bilaterally.  Labs    CBC  Recent Labs  04/19/16 1414 04/20/16 0715  WBC 7.2 6.8  HGB 13.7 13.3  HCT 42.6 41.8  MCV 88.9 91.5  PLT 216 962   Basic Metabolic Panel  Recent Labs  04/19/16 1414 04/20/16 0715  NA 139 142  K 4.8 4.7  CL 109 107  CO2 26 30  GLUCOSE 95 93  BUN 21* 16  CREATININE 0.98 1.18  CALCIUM 9.5 9.6  MG 2.1  --    Liver Function Tests No results for input(s): AST, ALT, ALKPHOS, BILITOT, PROT, ALBUMIN in the last 72 hours. No results for input(s): LIPASE, AMYLASE in the last 72  hours. Cardiac Enzymes  Recent Labs  04/19/16 1917 04/20/16 0048 04/20/16 0715  TROPONINI <0.03 <0.03 <0.03   BNP Invalid input(s): POCBNP D-Dimer No results for input(s): DDIMER in the last 72 hours. Hemoglobin A1C No results for input(s): HGBA1C in the last 72 hours. Fasting Lipid Panel No results for input(s): CHOL, HDL, LDLCALC, TRIG, CHOLHDL, LDLDIRECT in the last 72 hours. Thyroid Function Tests No results for input(s): TSH, T4TOTAL, T3FREE, THYROIDAB in the last 72 hours.  Invalid input(s): FREET3  Telemetry    SR  ECG    No morning.  Radiology    Dg Chest 2 View  Result Date: 04/19/2016 CLINICAL DATA:  Chest pain, nausea EXAM: CHEST  2 VIEW COMPARISON:  03/01/2016 FINDINGS: Cardiomediastinal silhouette is stable. Status post CABG. No acute infiltrate or pleural effusion. No pulmonary edema. Mild degenerative changes thoracic  spine. IMPRESSION: No active cardiopulmonary disease. Electronically Signed   By: Lahoma Crocker M.D.   On: 04/19/2016 15:09    Assessment & Plan    60 yo male with PMH of CAD (STEMI 10/2015 s/p POBA of LAD with IABP and subsequent emergent [id number]), HCV s/p treatment, prior tobacco abuse, GERD, anxiety, chronic systolic CHF (most recent EF 45-50%), tobacco abuse who presented to the ED with report of a single episode of severe left-sided chest discomfort after running up a flight of steps.  1. Chest pain with high risk cardiac etiology: Presented onset of DOE and chest pain described as a squeezing sensation in  His chest after briskly walking up 14 stairs at work. Took SL nitro which briefly helped his pain, also noted his BP to be 195/155 with his home BP cuff. In the ED initial Trop neg, EKG shows SR without acute ST/T wave changes. Given one dose of morphine with improvement in pain. States today's symptoms are not similar to that experienced back in 10/2015 when he presented as a STEMI in that they are not as severe. Reports being complaint with  home medications and not missing any doses.  Last 2D echo showe an improvement in EF to 45-50% with residual anterior hypokinesis and incoordinate septal motion.  -- Trops cycled and neg -- Presented to [facility] for exercise stress test. Images to follow  2. HTN: Stable at this time, will continue home medications and adjust as needed.  3. Tobacco Use: Reports he has stopped smoking  4. Chronic CHF: Does not appear volume overloaded on exam, last 2D echo stable with improved EF.    Signed, Reino Bellis NP-C Pager [phone]   I have seen, examined and evaluated the patient this PM along with Ms. Mancel Bale, NP-C.  After reviewing all the available data and chart, we discussed the patients laboratory, study & physical findings as well as symptoms in detail. I agree with her findings, examination as well as impression recommendations as per our discussion.   He ruled out for MI, and went for stress test today. I waited till the stress test was done,.  Did have an episode of discomfort last night, but no more pain today. He did well on the stress test. However now the report is reviewed and returned suggesting what I initially thought was a fixed apical defect consistent with his known anterior MI. However is now being read as high risk study with TID, but possible diaphragmatic attenuation. Unfortunately, with a high risk study and a clear defect in a patient who is having symptoms, I feel obligated to exclude true ischemic findings as suggested by this report.  He does have labile blood pressures with pressures anywhere from the 160s to 90s, which limits our ability to titrate his antianginal medications. He had no symptoms on the stress test treadmill.  Plan: Based on abnormal nuclear study, would recommend relook catheterization to exclude potential graft disease 5 months out from CABG.     Glenetta Hew, M.D., M.S. Interventional Cardiologist   Pager # [phone] Phone #  [phone] [address] [place],  [address]

## 2016-04-23 ENCOUNTER — Encounter (HOSPITAL_COMMUNITY): Payer: BC Managed Care – PPO

## 2016-04-26 ENCOUNTER — Encounter (HOSPITAL_COMMUNITY): Payer: BC Managed Care – PPO

## 2016-04-27 ENCOUNTER — Ambulatory Visit (INDEPENDENT_AMBULATORY_CARE_PROVIDER_SITE_OTHER): Payer: BC Managed Care – PPO

## 2016-04-27 DIAGNOSIS — I471 Supraventricular tachycardia: Secondary | ICD-10-CM

## 2016-04-28 ENCOUNTER — Encounter (HOSPITAL_COMMUNITY): Payer: BC Managed Care – PPO

## 2016-04-30 ENCOUNTER — Encounter (HOSPITAL_COMMUNITY): Payer: BC Managed Care – PPO

## 2016-04-30 ENCOUNTER — Encounter: Payer: Self-pay | Admitting: Interventional Cardiology

## 2016-05-01 ENCOUNTER — Telehealth: Payer: Self-pay | Admitting: Physician Assistant

## 2016-05-01 NOTE — Telephone Encounter (Signed)
[employer] called to report patient had a 3.5sec pause at 5:05 am. He was reported to be sinus tach 130s prior to this. I called pt - no symptoms at that time. He thinks he was just starting to wake up for the AM. He does report (as per recent hospital stay) HR has been variable, up to 105 at times per his monitoring. His next f/u isn't until 9/19 with Dr. Irish Lack. He seems somewhat anxious about wearing the heart monitor - says he's pushed the button a few times for lightheadedness but this is the first time he's gotten a call notifying him of any abnormal readings (as above, asx with this one). It's been over a week since his recent hospital stay. I think he'd benefit from a post cath visit to review what event monitor tracings are available so far to see if any med adjustments need to be made while he still has the monitor on. 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 Warning sx reviewed with pt. Will forward this msg to triage to see if they can work him in next week. [facility] will be faxing strips to our office. Lindwood Mogel PA-C

## 2016-05-03 NOTE — Telephone Encounter (Signed)
Pause during sleep is ok.  No syncope noted.  Continue monitor. OK to schedule f/u in the office.

## 2016-05-04 NOTE — Telephone Encounter (Signed)
**Note De-Identified  Obfuscation** The pt is offered an appt with Melina Copa, PA-c on 9/14 but he declined as he states he would like to see Dr Irish Lack at appt that is already scheduled for 9/19. He is advised to call us back if he develops worsening s/s prior to appt on 9/19. He verbalized understanding.

## 2016-05-05 ENCOUNTER — Encounter (HOSPITAL_COMMUNITY): Payer: BC Managed Care – PPO

## 2016-05-07 ENCOUNTER — Encounter (HOSPITAL_COMMUNITY): Payer: BC Managed Care – PPO

## 2016-05-07 IMAGING — CT CT ABD-PELV W/ CM
1 of 3 series · 14 of 32 positions shown, 19 images · IV contrast (OMNIPAQUE 300)
Comparison: None.

CLINICAL DATA: 58-year-old male with history of right upper
quadrant/right lower quadrant pain, nausea, weight loss and low
grade fever. Initial encounter

EXAM:
CT ABDOMEN AND PELVIS WITH CONTRAST
TECHNIQUE: Multidetector CT imaging of the abdomen and pelvis was performed
using the standard protocol following bolus administration of
intravenous contrast. Delayed images are also provided for
evaluation.
CONTRAST:  100mL OMNIPAQUE IOHEXOL 300 MG/ML  SOLN

[Series 2: abd/pel with · axial · 0.73mm/px · z∈[-504,-114]mm · 14 of 88 slices shown, 19 images]
[im 5/88  soft-tissue]
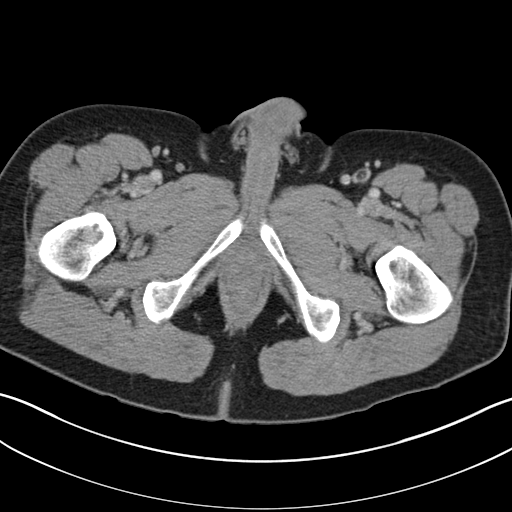
[im 5/88  bone]
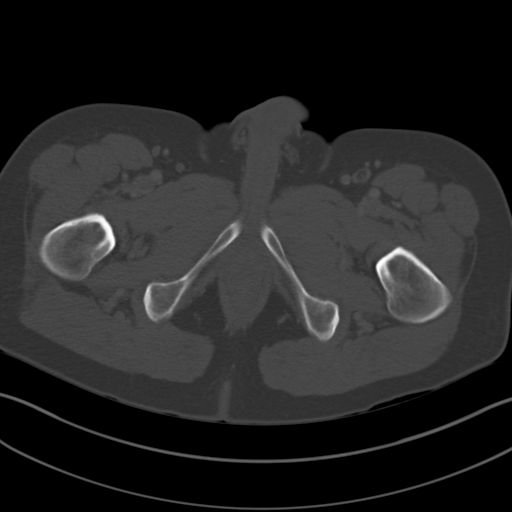
[im 14/88  soft-tissue]
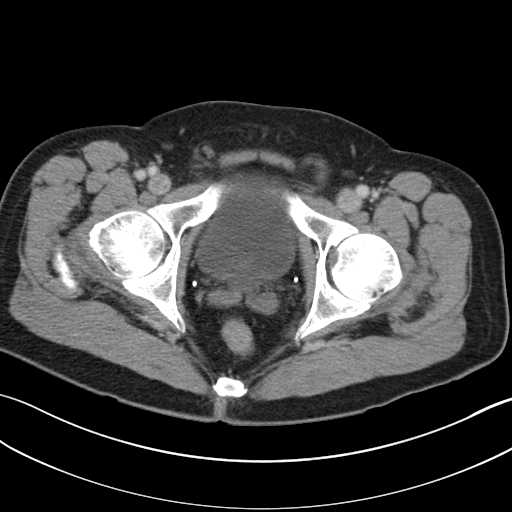
[im 18/88  soft-tissue]
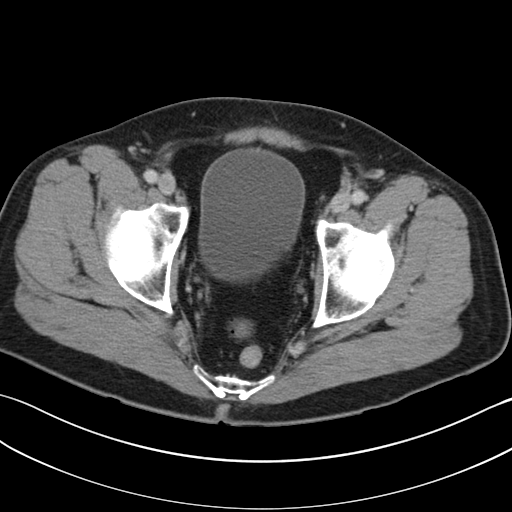
[im 27/88  soft-tissue]
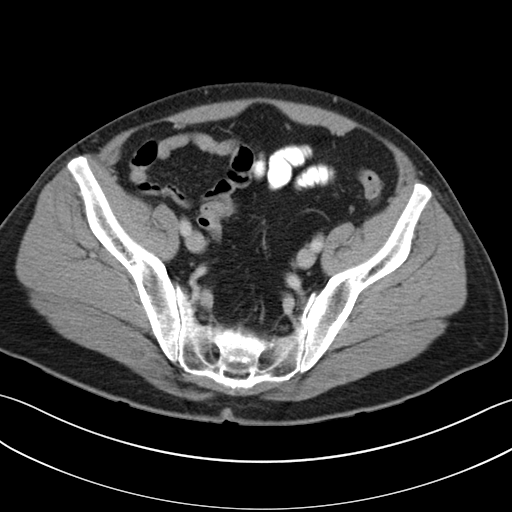
[im 31/88  soft-tissue]
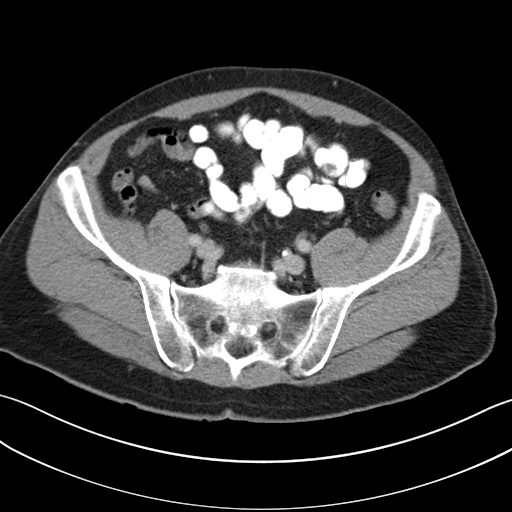
[im 40/88  soft-tissue]
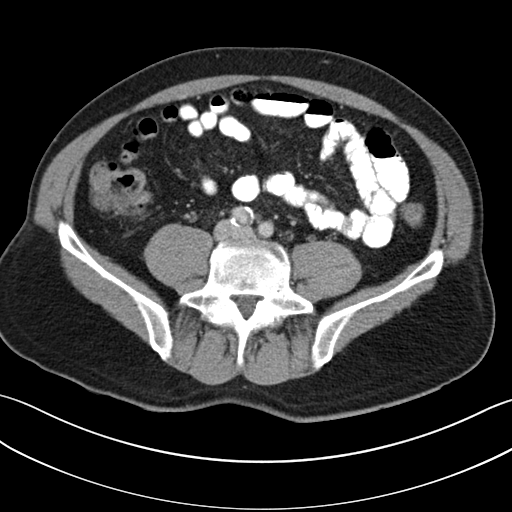
[im 44/88  soft-tissue]
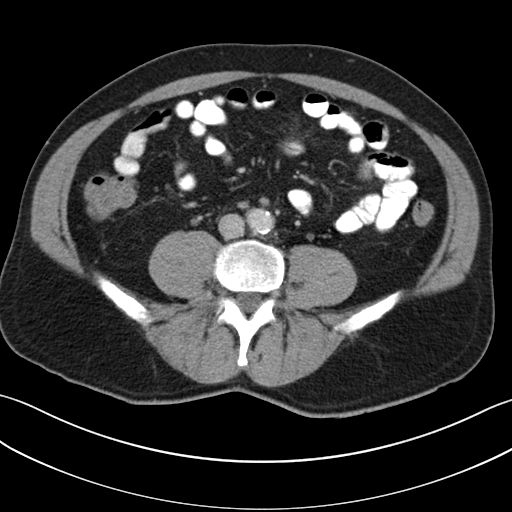
[im 48/88  soft-tissue]
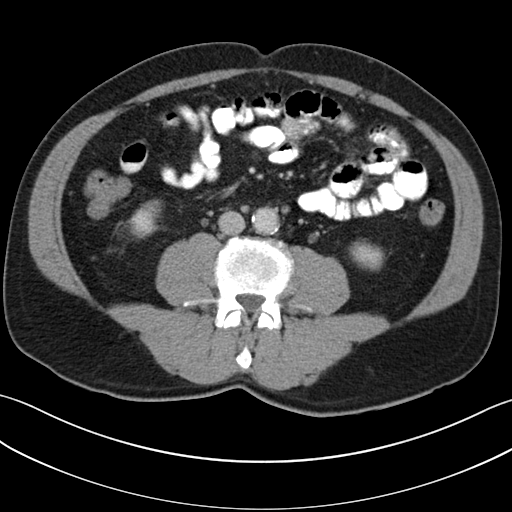
[im 57/88  soft-tissue]
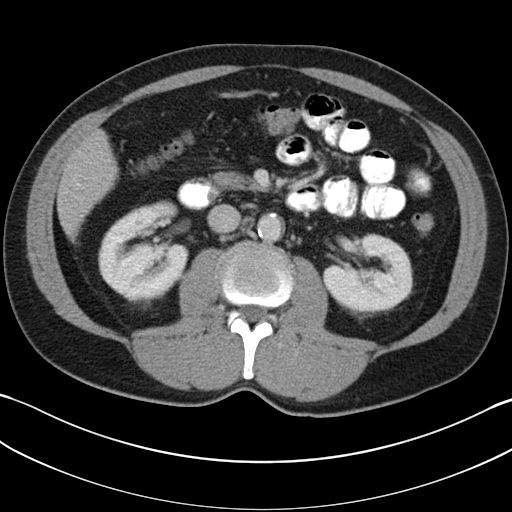
[im 57/88  bone]
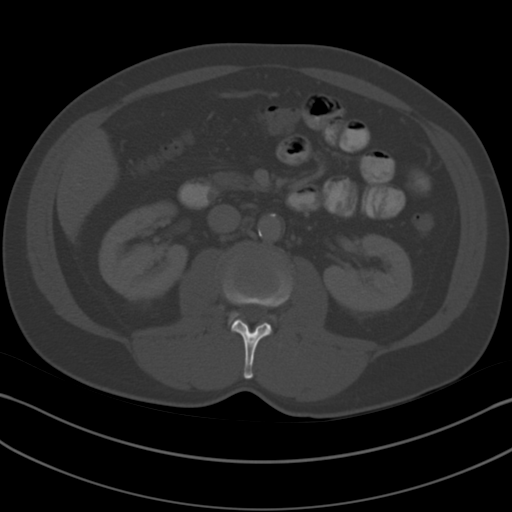
[im 61/88  soft-tissue]
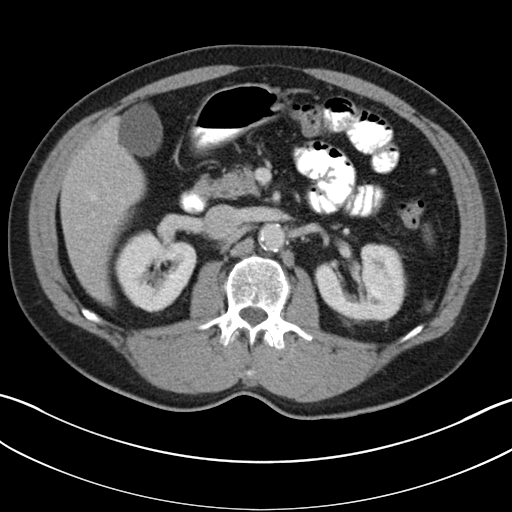
[im 70/88  soft-tissue]
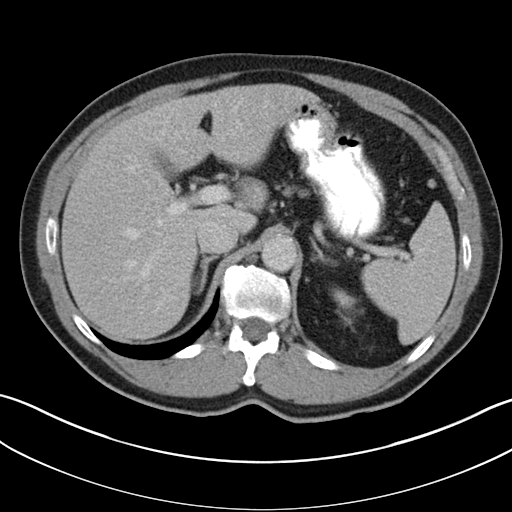
[im 70/88  lung]
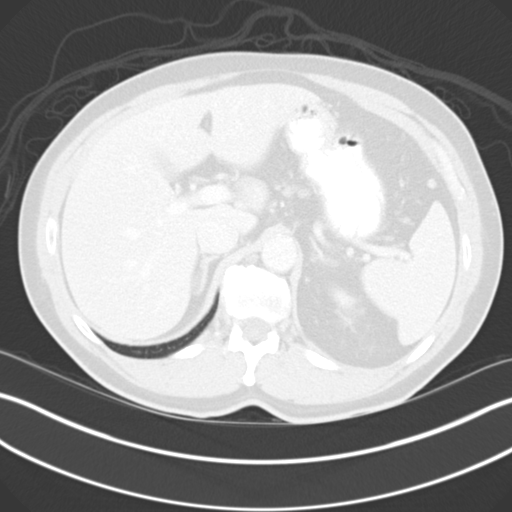
[im 74/88  soft-tissue]
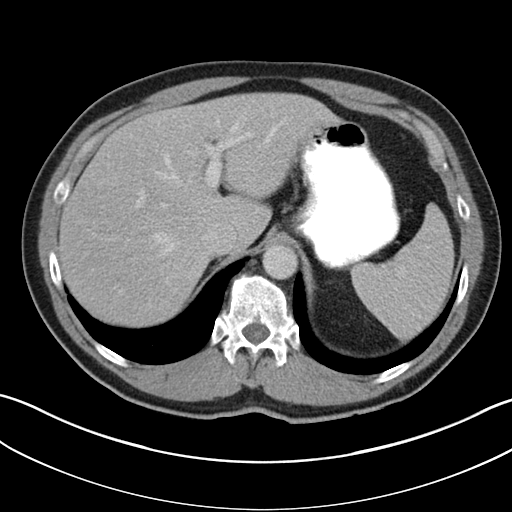
[im 74/88  lung]
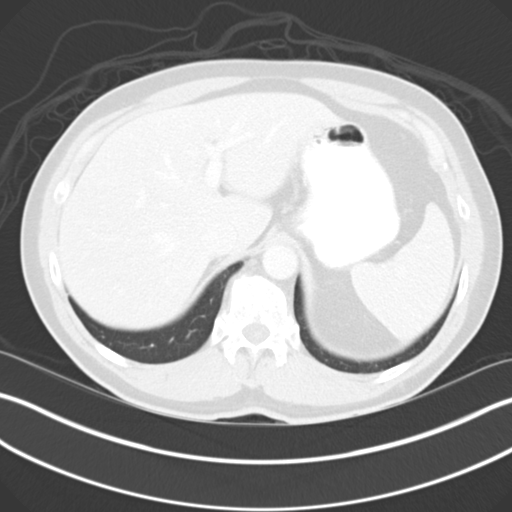
[im 79/88  lung]
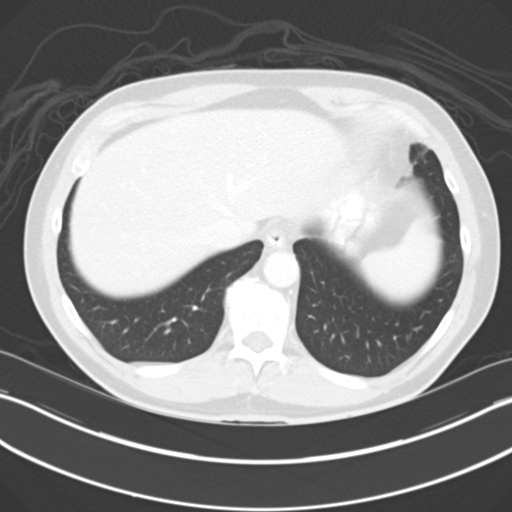
[im 83/88  soft-tissue]
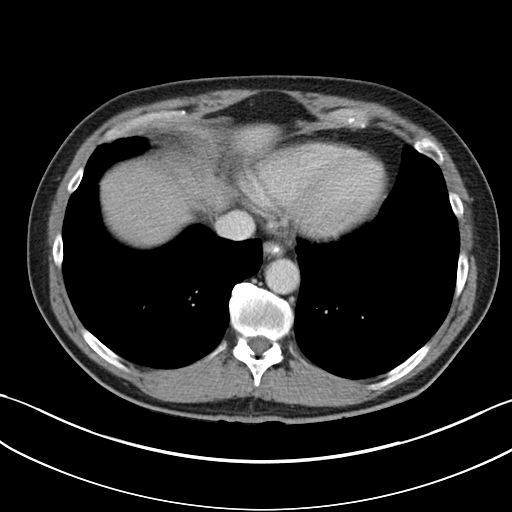
[im 83/88  lung]
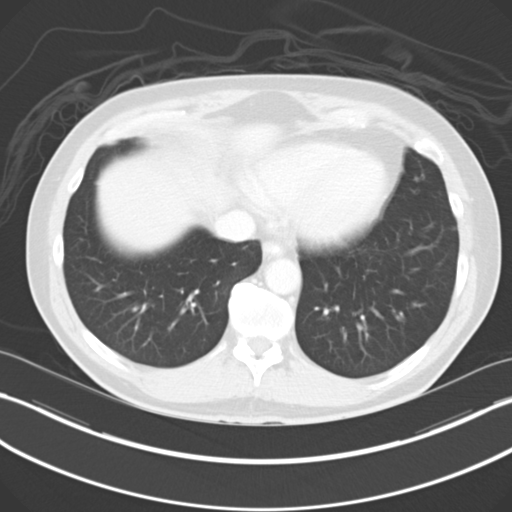

[14 of 32 positions shown; findings below may reference images not displayed]

FINDINGS: Chest:The partially visualized lung bases are unremarkable.

Liver: No suspicious hepatic mass is seen. There is no intrahepatic
ductal dilatation.

Gallbladder: There is no abnormal gallbladder wall thickening. There
is no pericholecystic fluid. There is no extrahepatic biliary ductal
dilatation.

Spleen: The spleen is unremarkable. A splenule is incidentally
noted.

Pancreas: The pancreas is unremarkable.

Adrenal glands: The adrenal glands are unremarkable.

Kidneys: There is no nephrolithiasis or hydronephrosis. No
suspicious renal mass is seen. A subcentimeter left lower pole renal
lesion is too small to characterize. Mild bilateral perinephric fat
stranding is present, likely physiologic.

Bowel/gastrointestinal tract: There is no abnormal bowel wall
thickening or inflammatory change. There is no evidence of bowel
obstruction. A few scattered colonic diverticula are present without
evidence for acute diverticulitis. The appendix is normal.

Pelvis: There is no pelvic mass. There is mild prominence of the
prostate gland.

Miscellaneous: There is no suspicious adenopathy. A few scattered
atherosclerotic aortic calcifications are present.

Osseous structures: There is no acute osseous abnormality.
Multilevel degenerative changes of the spine are present.
IMPRESSION: No CT evidence of an acute intra-abdominal or pelvic process.

## 2016-05-10 ENCOUNTER — Encounter (HOSPITAL_COMMUNITY): Payer: BC Managed Care – PPO

## 2016-05-12 ENCOUNTER — Telehealth: Payer: Self-pay | Admitting: Interventional Cardiology

## 2016-05-12 ENCOUNTER — Encounter (HOSPITAL_COMMUNITY): Payer: BC Managed Care – PPO

## 2016-05-12 DIAGNOSIS — I10 Essential (primary) hypertension: Secondary | ICD-10-CM

## 2016-05-12 DIAGNOSIS — R0602 Shortness of breath: Secondary | ICD-10-CM

## 2016-05-12 NOTE — Telephone Encounter (Signed)
Pt states that he sometimes waking up in sweats during the night and some SOB for the last 6 months. Pt also said that for the last 2 months he has been having pain between his knees and  this ankles this  waks him up during the night. Pt states that the pain is more when he is lying down then when standing. This  Pain has occur 4 to 5 times, and in the last 2 weeks.  Pt is wearing a 24 hrs monitor at this time, he states that his BP and heart rates fluctuates up and down.  pt states he is not having SOB or pain at this time. Pt has a 3 months F/U visit with Dr. Irish Lack next week 05/18/16. Pt would like to have some recommendations  prior his O/V.

## 2016-05-12 NOTE — Telephone Encounter (Signed)
New message     Pt verbalized that he wants to speak to rn about waking up in sweats/ from the knees down to his ankles it gives him pain at night and fatigue with occasional SOB   Pt is not SOB today for 6 months he wants to know if the issues will ever go away   He is not having any issues currently

## 2016-05-12 NOTE — Telephone Encounter (Signed)
Check BMet and BNP

## 2016-05-13 ENCOUNTER — Encounter (HOSPITAL_COMMUNITY): Payer: Self-pay | Admitting: *Deleted

## 2016-05-13 DIAGNOSIS — Z951 Presence of aortocoronary bypass graft: Secondary | ICD-10-CM

## 2016-05-13 DIAGNOSIS — I213 ST elevation (STEMI) myocardial infarction of unspecified site: Secondary | ICD-10-CM

## 2016-05-13 NOTE — Telephone Encounter (Signed)
**Note De-Identified  Obfuscation** The pt is advised and he verbalized understanding. He states that he will come to the office tomorrow for his lab work. BMEt and BNP has been ordered and scheduled to be done tomorrow 9/15.

## 2016-05-13 NOTE — Progress Notes (Signed)
Cardiac Individual Treatment Plan  Patient Details  Name: [name] MRN: [id number] Date of Birth: [birth year] Referring Provider:   Flowsheet Row CARDIAC REHAB PHASE II ORIENTATION from 02/03/2016 in [place]  Referring Provider  Larae Grooms MD      Initial Encounter Date:  [place] PHASE II ORIENTATION from 02/03/2016 in [place]  Date  02/03/16  Referring Provider  Larae Grooms MD      Visit Diagnosis: ST elevation (STEMI) myocardial infarction of unspecified site ([place])  S/P CABG x 5  Patient's Home Medications on Admission:  Current Outpatient Prescriptions:  .  aspirin 81 MG EC tablet, Take 1 tablet (81 mg total) by mouth daily. (Patient taking differently: Take 81 mg by mouth every morning. ), Disp: , Rfl:  .  atorvastatin (LIPITOR) 40 MG tablet, Take 1 tablet (40 mg total) by mouth daily. (Patient taking differently: Take 40 mg by mouth daily at 6 PM. ), Disp: 30 tablet, Rfl: 6 .  chlordiazePOXIDE (LIBRIUM) 25 MG capsule, Take 25 mg by mouth daily as needed for anxiety. , Disp: , Rfl:  .  clopidogrel (PLAVIX) 75 MG tablet, Take 1 tablet (75 mg total) by mouth daily. (Patient taking differently: Take 75 mg by mouth every morning. ), Disp: 30 tablet, Rfl: 11 .  lisinopril (PRINIVIL,ZESTRIL) 5 MG tablet, Take 1 tablet (5 mg total) by mouth daily. (Patient taking differently: Take 5 mg by mouth every evening. ), Disp: 90 tablet, Rfl: 3 .  Multiple Vitamins-Minerals (ONE-A-DAY MENS 50+ ADVANTAGE PO), Take 1 tablet by mouth daily. , Disp: , Rfl:  .  nitroGLYCERIN (NITROSTAT) 0.4 MG SL tablet, Place 1 tablet (0.4 mg total) under the tongue every 5 (five) minutes as needed for chest pain., Disp: 25 tablet, Rfl: 3 .  pantoprazole (PROTONIX) 40 MG tablet, Take 40 mg by mouth daily as needed (FOR REFLUX). FOR REFLUX, Disp: , Rfl:  .  ranitidine (ZANTAC) 150 MG tablet, Take 150 mg by mouth as  needed for heartburn., Disp: , Rfl:  .  zolpidem (AMBIEN) 10 MG tablet, Take 10 mg by mouth at bedtime as needed for sleep. Reported on 02/09/2016, Disp: , Rfl:   Past Medical History: Past Medical History:  Diagnosis Date  . Acute hepatitis C without mention of hepatic coma    Tx by [facility] for Hep.  negative for hep c 2/15.  Marland Kitchen Anxiety   . CAD (coronary artery disease)    a. 10/2015 ant STEMI >> LHC with 3 v CAD; oLAD tx with POBA >> emergent CABG.  . Chronic systolic CHF (congestive heart failure) ([place])   . Colitis   . Esophageal reflux    eosinophil esophagitis  . Former tobacco use   . Ischemic cardiomyopathy    a. EF 25-30% at intraop TEE 4/17  //  b. Limited Echo 5/17 - EF 45-50%, mild ant HK  . Sinus bradycardia    a. HR dropping into 40s in 02/2016 -> BB reduced.  . Symptomatic hypotension    a. 02/2016 ER visit -> meds reduced.    Tobacco Use: History  Smoking Status  . Former Smoker  . Packs/day: 0.50  . Types: Cigarettes  . Quit date: 11/28/2015  Smokeless Tobacco  . Never Used    Labs: Recent Review Flowsheet Data    Labs for ITP Cardiac and Pulmonary Rehab Latest Ref Rng & Units 11/29/2015 11/29/2015 02/12/2016 02/12/2016 04/06/2016   Cholestrol 125 -  200 mg/dL - - 164 165 174   LDLCALC <130 mg/dL - - 88 88 109   HDL >=40 mg/dL - - 47 49 50   Trlycerides <150 mg/dL - - 143 142 73   Hemoglobin A1c 4.8 - 5.6 % - - - - -   PHART 7.350 - 7.450 7.415 - - - -   PCO2ART 35.0 - 45.0 mmHg 43.1 - - - -   HCO3 20.0 - 24.0 mEq/L 27.7(H) - - - -   TCO2 0 - 100 mmol/L 29 23 - - -   ACIDBASEDEF 0.0 - 2.0 mmol/L - - - - -   O2SAT % 98.0 - - - -      Capillary Blood Glucose: Lab Results  Component Value Date   GLUCAP 104 (H) 11/30/2015   GLUCAP 106 (H) 11/30/2015   GLUCAP 93 11/29/2015   GLUCAP 129 (H) 11/29/2015   GLUCAP 98 11/29/2015     Exercise Target Goals:    Exercise Program Goal: Individual exercise prescription set with THRR, safety & activity barriers.  Participant demonstrates ability to understand and report RPE using BORG scale, to self-measure pulse accurately, and to acknowledge the importance of the exercise prescription.  Exercise Prescription Goal: Starting with aerobic activity 30 plus minutes a day, 3 days per week for initial exercise prescription. Provide home exercise prescription and guidelines that participant acknowledges understanding prior to discharge.  Activity Barriers & Risk Stratification:   6 Minute Walk:   Initial Exercise Prescription:   Perform Capillary Blood Glucose checks as needed.  Exercise Prescription Changes:      Exercise Prescription Changes    Row Name 04/14/16 1800 05/14/16 1000           Exercise Review   Progression Yes Yes        Response to Exercise   Blood Pressure (Admit) 102/64 124/83      Blood Pressure (Exercise) 134/70 142/72      Blood Pressure (Exit) 104/62 102/60      Heart Rate (Admit) 75 bpm 98 bpm      Heart Rate (Exercise) 170 bpm 157 bpm      Heart Rate (Exit) 90 bpm 95 bpm      Rating of Perceived Exertion (Exercise) 12 13      Symptoms none none      Comments Reviewed home exercise guidelines on 03/03/16. Reviewed home exercise guidelines on 03/03/16.      Duration Progress to 30 minutes of continuous aerobic without signs/symptoms of physical distress Progress to 30 minutes of continuous aerobic without signs/symptoms of physical distress      Intensity THRR unchanged THRR unchanged        Progression   Progression Continue to progress workloads to maintain intensity without signs/symptoms of physical distress. Continue to progress workloads to maintain intensity without signs/symptoms of physical distress.      Average METs 4.3 4.4        Resistance Training   Training Prescription Yes No      Weight 2 lbs no wts today      Reps 10-12  -        Interval Training   Interval Training No No        Treadmill   MPH 2.5 2.5      Grade 2 2      Minutes 10 10       METs 3.6 3.6        Bike   Level 2  2      Minutes 10 10      METs 5.79 5.79        NuStep   Level 4 4      Minutes 10 10      METs 3.6 3.9        Home Exercise Plan   Plans to continue exercise at [place] 3 additional days to program exercise sessions. Add 3 additional days to program exercise sessions.         Exercise Comments:      Exercise Comments    Row Name 04/14/16 1811 05/14/16 1010         Exercise Comments Patient returned today after being out due to health issues. Pt has been walking 45-60 minutes 4 days/week while he's been out. Sporadic attendance at cardiac rehab for personal reasons. Progressing workloads as able when here.         Discharge Exercise Prescription (Final Exercise Prescription Changes):     Exercise Prescription Changes - 05/14/16 1000      Exercise Review   Progression Yes     Response to Exercise   Blood Pressure (Admit) 124/83   Blood Pressure (Exercise) 142/72   Blood Pressure (Exit) 102/60   Heart Rate (Admit) 98 bpm   Heart Rate (Exercise) 157 bpm   Heart Rate (Exit) 95 bpm   Rating of Perceived Exertion (Exercise) 13   Symptoms none   Comments Reviewed home exercise guidelines on 03/03/16.   Duration Progress to 30 minutes of continuous aerobic without signs/symptoms of physical distress   Intensity THRR unchanged     Progression   Progression Continue to progress workloads to maintain intensity without signs/symptoms of physical distress.   Average METs 4.4     Resistance Training   Training Prescription No   Weight no wts today     Interval Training   Interval Training No     Treadmill   MPH 2.5   Grade 2   Minutes 10   METs 3.6     Bike   Level 2   Minutes 10   METs 5.79     NuStep   Level 4   Minutes 10   METs 3.9     Home Exercise Plan   Plans to continue exercise at Home   Frequency Add 3 additional days to program exercise sessions.      Nutrition:  Target Goals:  Understanding of nutrition guidelines, daily intake of sodium '1500mg'$ , cholesterol '200mg'$ , calories 30% from fat and 7% or less from saturated fats, daily to have 5 or more servings of fruits and vegetables.  Biometrics:    Nutrition Therapy Plan and Nutrition Goals:   Nutrition Discharge: Nutrition Scores:   Nutrition Goals Re-Evaluation:   Psychosocial: Target Goals: Acknowledge presence or absence of depression, maximize coping skills, provide positive support system. Participant is able to verbalize types and ability to use techniques and skills needed for reducing stress and depression.  Initial Review & Psychosocial Screening:   Quality of Life Scores:   PHQ-9: Recent Review Flowsheet Data    Depression screen [facility] 2/9 02/09/2016   Decreased Interest 2   Down, Depressed, Hopeless 2   PHQ - 2 Score 4   Altered sleeping 2   Tired, decreased energy 0   Change in appetite 0   Feeling bad or failure about yourself  0   Trouble concentrating 0   Moving slowly or  fidgety/restless 0   Suicidal thoughts 0   PHQ-9 Score 6   Difficult doing work/chores Somewhat difficult      Psychosocial Evaluation and Intervention:   Psychosocial Re-Evaluation:     Psychosocial Re-Evaluation    [place] Name 04/14/16 1700             Psychosocial Re-Evaluation   Interventions Stress management education;Encouraged to attend Cardiac Rehabilitation for the exercise       Continued Psychosocial Services Needed Yes          Vocational Rehabilitation: Provide vocational rehab assistance to qualifying candidates.   Vocational Rehab Evaluation & Intervention:   Education: Education Goals: Education classes will be provided on a weekly basis, covering required topics. Participant will state understanding/return demonstration of topics presented.  Learning Barriers/Preferences:   Education Topics: Count Your Pulse:  -Group instruction provided by verbal instruction,  demonstration, patient participation and written materials to support subject.  Instructors address importance of being able to find your pulse and how to count your pulse when at home without a heart monitor.  Patients get hands on experience counting their pulse with staff help and individually. Flowsheet Row CARDIAC REHAB PHASE II EXERCISE from 04/16/2016 in [place]  Date  03/05/16  Instruction Review Code  2- meets goals/outcomes      Heart Attack, Angina, and Risk Factor Modification:  -Group instruction provided by verbal instruction, video, and written materials to support subject.  Instructors address signs and symptoms of angina and heart attacks.    Also discuss risk factors for heart disease and how to make changes to improve heart health risk factors.   Functional Fitness:  -Group instruction provided by verbal instruction, demonstration, patient participation, and written materials to support subject.  Instructors address safety measures for doing things around the house.  Discuss how to get up and down off the floor, how to pick things up properly, how to safely get out of a chair without assistance, and balance training. Flowsheet Row CARDIAC REHAB PHASE II EXERCISE from 04/16/2016 in [place]  Date  04/16/16  Educator  EP  Instruction Review Code  2- meets goals/outcomes      Meditation and Mindfulness:  -Group instruction provided by verbal instruction, patient participation, and written materials to support subject.  Instructor addresses importance of mindfulness and meditation practice to help reduce stress and improve awareness.  Instructor also leads participants through a meditation exercise.  Flowsheet Row CARDIAC REHAB PHASE II EXERCISE from 04/16/2016 in [place]  Date  03/03/16  Educator  [place]  Instruction Review Code  2- meets goals/outcomes       Stretching for Flexibility and Mobility:  -Group instruction provided by verbal instruction, patient participation, and written materials to support subject.  Instructors lead participants through series of stretches that are designed to increase flexibility thus improving mobility.  These stretches are additional exercise for major muscle groups that are typically performed during regular warm up and cool down.   Hands Only CPR Anytime:  -Group instruction provided by verbal instruction, video, patient participation and written materials to support subject.  Instructors co-teach with AHA video for hands only CPR.  Participants get hands on experience with mannequins.   Nutrition I class: Heart Healthy Eating:  -Group instruction provided by PowerPoint slides, verbal discussion, and written materials to support subject matter. The instructor gives an explanation and review of the Therapeutic Lifestyle Changes  diet recommendations, which includes a discussion on lipid goals, dietary fat, sodium, fiber, plant stanol/sterol esters, sugar, and the components of a well-balanced, healthy diet.   Nutrition II class: Lifestyle Skills:  -Group instruction provided by PowerPoint slides, verbal discussion, and written materials to support subject matter. The instructor gives an explanation and review of label reading, grocery shopping for heart health, heart healthy recipe modifications, and ways to make healthier choices when eating out.   Diabetes Question & Answer:  -Group instruction provided by PowerPoint slides, verbal discussion, and written materials to support subject matter. The instructor gives an explanation and review of diabetes co-morbidities, pre- and post-prandial blood glucose goals, pre-exercise blood glucose goals, signs, symptoms, and treatment of hypoglycemia and hyperglycemia, and foot care basics.   Diabetes Blitz:  -Group instruction provided by PowerPoint slides, verbal  discussion, and written materials to support subject matter. The instructor gives an explanation and review of the physiology behind type 1 and type 2 diabetes, diabetes medications and rational behind using different medications, pre- and post-prandial blood glucose recommendations and Hemoglobin A1c goals, diabetes diet, and exercise including blood glucose guidelines for exercising safely.    Portion Distortion:  -Group instruction provided by PowerPoint slides, verbal discussion, written materials, and food models to support subject matter. The instructor gives an explanation of serving size versus portion size, changes in portions sizes over the last 20 years, and what consists of a serving from each food group. Flowsheet Row CARDIAC REHAB PHASE II EXERCISE from 04/16/2016 in [place]  Date  02/25/16  Educator  RD  Instruction Review Code  2- meets goals/outcomes      Stress Management:  -Group instruction provided by verbal instruction, video, and written materials to support subject matter.  Instructors review role of stress in heart disease and how to cope with stress positively.     Exercising on Your Own:  -Group instruction provided by verbal instruction, power point, and written materials to support subject.  Instructors discuss benefits of exercise, components of exercise, frequency and intensity of exercise, and end points for exercise.  Also discuss use of nitroglycerin and activating EMS.  Review options of places to exercise outside of rehab.  Review guidelines for sex with heart disease.   Cardiac Drugs I:  -Group instruction provided by verbal instruction and written materials to support subject.  Instructor reviews cardiac drug classes: antiplatelets, anticoagulants, beta blockers, and statins.  Instructor discusses reasons, side effects, and lifestyle considerations for each drug class. Flowsheet Row CARDIAC REHAB PHASE II EXERCISE from  04/16/2016 in [place]  Date  04/14/16  Instruction Review Code  2- meets goals/outcomes      Cardiac Drugs II:  -Group instruction provided by verbal instruction and written materials to support subject.  Instructor reviews cardiac drug classes: angiotensin converting enzyme inhibitors (ACE-I), angiotensin II receptor blockers (ARBs), nitrates, and calcium channel blockers.  Instructor discusses reasons, side effects, and lifestyle considerations for each drug class. Flowsheet Row CARDIAC REHAB PHASE II EXERCISE from 04/16/2016 in [place]  Date  03/10/16  Instruction Review Code  2- meets goals/outcomes      Anatomy and Physiology of the Circulatory System:  -Group instruction provided by verbal instruction, video, and written materials to support subject.  Reviews functional anatomy of heart, how it relates to various diagnoses, and what role the heart plays in the overall system.   Knowledge Questionnaire Score:   Core  Components/Risk Factors/Patient Goals at Admission:   Core Components/Risk Factors/Patient Goals Review:      Goals and Risk Factor Review    Row Name 04/14/16 1813 05/14/16 1011           Core Components/Risk Factors/Patient Goals Review   Personal Goals Review Other Other      Review Patient has had health issues that kept him from attending cardiac rehab regularly but retruned to exercise today. Pt has been walking 4 days/ week at home 45-60 minutes.  Patient has been absent from [facility] rehab for personal reasons. Pt has been walking 4 days/ week at home 45-60 minutes.       Expected Outcomes Maintain walking program regularly to help improve strength and endurance and increase energy. Maintain walking program regularly to help improve strength and endurance and increase energy.         Core Components/Risk Factors/Patient Goals at Discharge (Final Review):      Goals and Risk Factor  Review - 05/14/16 1011      Core Components/Risk Factors/Patient Goals Review   Personal Goals Review Other   Review Patient has been absent from [facility] rehab for personal reasons. Pt has been walking 4 days/ week at home 45-60 minutes.    Expected Outcomes Maintain walking program regularly to help improve strength and endurance and increase energy.      ITP Comments:   Comments: Giancarlo last day of exercise was on 04/16/2016 and has not returned to exercise since his hospitalization. Will await Dr Hassell Done recommendation on when Mr Norkus can resume exercise. Mr Ericsson has until 06/07/16 to complete the program.Sibley Rolison Venetia Maxon, RN,BSN 05/14/2016 12:14 PM

## 2016-05-14 ENCOUNTER — Other Ambulatory Visit: Payer: BC Managed Care – PPO

## 2016-05-14 ENCOUNTER — Encounter (HOSPITAL_COMMUNITY): Payer: BC Managed Care – PPO

## 2016-05-14 NOTE — Addendum Note (Signed)
Encounter addended by: Sol Passer on: 05/14/2016 10:17 AM<BR>    Actions taken: Visit Navigator Flowsheet section accepted, Flowsheet data copied forward, Flowsheet accepted

## 2016-05-17 ENCOUNTER — Encounter (HOSPITAL_COMMUNITY): Payer: BC Managed Care – PPO

## 2016-05-18 ENCOUNTER — Encounter: Payer: Self-pay | Admitting: Interventional Cardiology

## 2016-05-18 ENCOUNTER — Ambulatory Visit (INDEPENDENT_AMBULATORY_CARE_PROVIDER_SITE_OTHER): Payer: BC Managed Care – PPO | Admitting: Interventional Cardiology

## 2016-05-18 VITALS — BP 100/70 | HR 84 | Ht 69.0 in | Wt 183.4 lb

## 2016-05-18 DIAGNOSIS — I2510 Atherosclerotic heart disease of native coronary artery without angina pectoris: Secondary | ICD-10-CM

## 2016-05-18 DIAGNOSIS — I252 Old myocardial infarction: Secondary | ICD-10-CM | POA: Diagnosis not present

## 2016-05-18 DIAGNOSIS — R0602 Shortness of breath: Secondary | ICD-10-CM

## 2016-05-18 DIAGNOSIS — R5383 Other fatigue: Secondary | ICD-10-CM | POA: Diagnosis not present

## 2016-05-18 DIAGNOSIS — E785 Hyperlipidemia, unspecified: Secondary | ICD-10-CM | POA: Diagnosis not present

## 2016-05-18 DIAGNOSIS — F32A Depression, unspecified: Secondary | ICD-10-CM

## 2016-05-18 DIAGNOSIS — F329 Major depressive disorder, single episode, unspecified: Secondary | ICD-10-CM

## 2016-05-18 NOTE — Patient Instructions (Signed)
Medication Instructions:  None   Labwork: Your physician recommends that you return for a FASTING lipid profile/CMET/TSH. The lab is open from 7:30AM-5PM. You should not eat or drink after midnight the night before your lab.   Testing/Procedures: None   Follow-Up: Your physician wants you to follow-up in: 6 months with Dr Irish Lack. (March 2018). You will receive a reminder letter in the mail two months in advance. If you don't receive a letter, please call our office to schedule the follow-up appointment.        If you need a refill on your cardiac medications before your next appointment, please call your pharmacy.

## 2016-05-18 NOTE — Progress Notes (Signed)
Cardiology Office Note   Date:  05/18/2016   ID:  [name], DOB [birth year], MRN [id number]  PCP:  [name], MD    No chief complaint on file. CAD   Wt Readings from Last 3 Encounters:  05/18/16 183 lb 6.4 oz (83.2 kg)  04/21/16 172 lb (78 kg)  03/18/16 173 lb 9.6 oz (78.7 kg)       History of Present Illness: [name] is a 60 y.o. male with history of CAD (STEMI 10/2015 s/p POBA of LAD with IABP and subsequent emergent CABG), HCV s/p treatment, prior tobacco abuse, GERD, anxiety, chronic systolic CHF (most recent EF 45-50%), tobacco abuse who presents for f/u. He was previously seen in the pharmacist-driven HTN clinic for BP med adjustment at which time it was noted his home cuff was overestimating systolic blood pressure by 10-15 points. Last echo 12/2015 to re-eval EF showed improvement to 45-50%.   He has had [facility], which he now thinks are allergies.  He continues to have some mental slowness and fatigue.  He has leg aching, worse when he walks a lot.    He has imporoved his diet signficantly since the MI.    His appetite is increasing.    He felt better when taking Cymbalta.  He stopped it.  He is willing to take this again  He will follow up with his psychiatrist.     Past Medical History:  Diagnosis Date  . Acute hepatitis C without mention of hepatic coma    Tx by [facility] for Hep.  negative for hep c 2/15.  Marland Kitchen Anxiety   . CAD (coronary artery disease)    a. 10/2015 ant STEMI >> LHC with 3 v CAD; oLAD tx with POBA >> emergent CABG.  . Chronic systolic CHF (congestive heart failure) ([place])   . Colitis   . Esophageal reflux    eosinophil esophagitis  . Former tobacco use   . Ischemic cardiomyopathy    a. EF 25-30% at intraop TEE 4/17  //  b. Limited Echo 5/17 - EF 45-50%, mild ant HK  . Sinus bradycardia    a. HR dropping into 40s in 02/2016 -> BB reduced.  . Symptomatic hypotension    a. 02/2016 ER visit -> meds reduced.    Past Surgical History:    Procedure Laterality Date  . CARDIAC CATHETERIZATION N/A 11/28/2015   Procedure: Left Heart Cath and Coronary Angiography;  Surgeon: [name], MD;  Location: [place] [facility];  Service: Cardiovascular;  Laterality: N/A;  . CARDIAC CATHETERIZATION N/A 11/28/2015   Procedure: Coronary Balloon Angioplasty;  Surgeon: [name], MD;  Location: [place] [facility];  Service: Cardiovascular;  Laterality: N/A;  ostial LAD  . CARDIAC CATHETERIZATION N/A 11/28/2015   Procedure: Coronary/Graft Angiography;  Surgeon: [name], MD;  Location: [place] [facility];  Service: Cardiovascular;  Laterality: N/A;  coronaries only   . CARDIAC CATHETERIZATION N/A 04/21/2016   Procedure: Left Heart Cath and Coronary Angiography;  Surgeon: Wellington Hampshire, MD;  Location: [place] [facility];  Service: Cardiovascular;  Laterality: N/A;  . CORONARY ARTERY BYPASS GRAFT N/A 11/28/2015   Procedure: CORONARY ARTERY BYPASS GRAFTING (CABG) TIMES FIVE USING LEFT INTERNAL MAMMARY ARTERY AND RIGHT GREATER SAPHENOUS,VIEN HARVEATED BY ENDOVIEN, INTRAOPPRATIVE TEE;  Surgeon: Gaye Pollack, MD;  Location: [facility];  Service: Open Heart Surgery;  Laterality: N/A;  . SHOULDER SURGERY       Current Outpatient Prescriptions  Medication  Sig Dispense Refill  . aspirin 81 MG tablet Take 81 mg by mouth daily.    Marland Kitchen atorvastatin (LIPITOR) 40 MG tablet Take 40 mg by mouth daily.    . chlordiazePOXIDE (LIBRIUM) 25 MG capsule Take 25 mg by mouth daily as needed for anxiety.     . clopidogrel (PLAVIX) 75 MG tablet Take 75 mg by mouth daily.    Marland Kitchen lisinopril (PRINIVIL,ZESTRIL) 5 MG tablet Take 5 mg by mouth daily.    . Multiple Vitamins-Minerals (ONE-A-DAY MENS 50+ ADVANTAGE PO) Take 1 tablet by mouth daily.     . nitroGLYCERIN (NITROSTAT) 0.4 MG SL tablet Place 0.4 mg under the tongue every 5 (five) minutes as needed for chest pain. 3 DOSE MAX    . pantoprazole (PROTONIX) 40 MG tablet Take 40 mg by mouth daily as  needed (FOR REFLUX). FOR REFLUX    . ranitidine (ZANTAC) 150 MG tablet Take 150 mg by mouth as needed for heartburn.    . zolpidem (AMBIEN) 10 MG tablet Take 10 mg by mouth at bedtime as needed for sleep. Reported on 02/09/2016     No current facility-administered medications for this visit.     Allergies:   Prednisone; Tetanus toxoids; Wellbutrin [bupropion]; and Chantix [varenicline]    Social History:  The patient  reports that he quit smoking about 5 months ago. His smoking use included Cigarettes. He smoked 0.50 packs per day. He has never used smokeless tobacco. He reports that he does not drink alcohol or use drugs.   Family History:  The patient's family history includes Heart Problems in his father; Heart attack in his maternal grandmother and paternal uncle; Heart attack (age of onset: 97) in his father; Hypertension in his brother; Lung cancer in his mother; Stroke in his father and maternal grandmother.    ROS:  Please see the history of present illness.   Otherwise, review of systems are positive for leg aching.   All other systems are reviewed and negative.    PHYSICAL EXAM: VS:  BP 100/70   Pulse 84   Ht '5\' 9"'$  (1.753 m)   Wt 183 lb 6.4 oz (83.2 kg)   BMI 27.08 kg/m  , BMI Body mass index is 27.08 kg/m. GEN: Well nourished, well developed, in no acute distress  HEENT: normal  Neck: no JVD, carotid bruits, or masses Cardiac: RRR; no murmurs, rubs, or gallops,no edema  Respiratory:  clear to auscultation bilaterally, normal work of breathing GI: soft, nontender, nondistended, + BS MS: no deformity or atrophy  Skin: warm and dry, no rash Neuro:  Strength and sensation are intact Psych: euthymic mood, full affect     Recent Labs: 04/06/2016: ALT 28 04/19/2016: Magnesium 2.1 04/20/2016: Hemoglobin 13.3; Platelets 159 04/21/2016: BUN 14; Creatinine, Ser 1.13; Potassium 3.9; Sodium 141   Lipid Panel    Component Value Date/Time   CHOL 174 04/06/2016 1026   TRIG 73  04/06/2016 1026   HDL 50 04/06/2016 1026   CHOLHDL 3.5 04/06/2016 1026   VLDL 15 04/06/2016 1026   LDLCALC 109 04/06/2016 1026     Other studies Reviewed: Additional studies/ records that were reviewed today with results demonstrating: cath records.  Bloo dwork has ben normal .  No TSH checked.   ASSESSMENT AND PLAN:  1. CAD/MI: No angina.  No heart failure.  COntinue secondary prevention. 2. Hyperlipidemia:  LDL increased on the atorva 40 instead of atorva 80 mg. He is going to try to see if LDL comes  down with better lifestyle changes.  3. Depression:  I suggested that he f/u with his psychiatrist and restart Cymbalta.  He felt better mentally when taking this medicine in the past.  4. SHOB: Improved.  I don't think this was cardiac.  He thinks this is from allergies.   Current medicines are reviewed at length with the patient today.  The patient concerns regarding his medicines were addressed.  The following changes have been made:  No change  Labs/ tests ordered today include:  No orders of the defined types were placed in this encounter.   Recommend 150 minutes/week of aerobic exercise Low fat, low carb, high fiber diet recommended  Disposition:   FU in 6 months   Signed, Larae Grooms, MD  05/18/2016 10:23 AM    [facility] [address], [place], [place]  [address] Phone: [phone]; Fax: [phone]

## 2016-06-02 ENCOUNTER — Telehealth: Payer: Self-pay | Admitting: Interventional Cardiology

## 2016-06-02 NOTE — Telephone Encounter (Signed)
Pt states has been having low HR for the last 2 days. Last night pt's HR was low 60's early this AM HR was 48 beats/minute BP 117/71.Pt was feeling light headed tired, unable to think straight. As I am talking pt took his BP 126/80 HR 56 beats/minute. Pt still feels light headed , nauseas, mild SOB with activity. Pt denies chest pain. Pt states he is by himself at home, has no one to drive him to the office, he is aware that if symptoms continue  and low HR he will call EMS. Pt's last VS BP 125/70 HR 61 beats/minute. Pt refused to see a PA/NP. He states what are they  going to do , just talk to me?. Pt has an appointment with DR. [place] on 07/13/16 at 10:30 AM. Pt will call EMS if symptoms continue or worsen.

## 2016-06-02 NOTE — Telephone Encounter (Signed)
New message    Pt calling stating that he is feeling lightheaded. Pulse rate is high 40's to low 50's. No SOB, no chest pain, no heart palpitations, no syncope, no swelling. I offered an appt w/[facility] tomorrow but the pt declined. He stated he wanted to speak to the nurse first and would make an appt based on what the nurse says.  Please call.

## 2016-06-02 NOTE — Telephone Encounter (Signed)
He is not on any rate slowing drugs.  WOuld confirm that he is not taking metoprolol.

## 2016-06-03 NOTE — Telephone Encounter (Addendum)
**Note De-Identified  Obfuscation** The pt states that he has not taken Metoprolol since this past July.  He reports that his HR stayed in the mid 40's all afternoon yesterday and that it was 52 bpm this am when he woke up but has gone up to 66 bpm at this time. He c/o feeling weak and cant concentrate.  Please advise.

## 2016-06-03 NOTE — Telephone Encounter (Signed)
The only treatment for slow heart rate not on meds is a pacemaker.  He does not meet criteria for pacer at this time.  It is normal for his HR to be slow during sleep and when he first wakes up.

## 2016-06-03 NOTE — Telephone Encounter (Signed)
**Note De-Identified  Obfuscation** The pt is advised. He checked his HR while we were on the phone and it was 84 bpm. He is advised to drink plenty of fluids, to monitor his HR daily and to call us back if his HR drops and stays low and/or if he feels bad. He verbalized understanding and is in agreement with plan.

## 2016-06-07 ENCOUNTER — Telehealth (HOSPITAL_COMMUNITY): Payer: Self-pay | Admitting: *Deleted

## 2016-06-07 ENCOUNTER — Encounter (HOSPITAL_COMMUNITY): Payer: Self-pay | Admitting: *Deleted

## 2016-06-07 DIAGNOSIS — Z951 Presence of aortocoronary bypass graft: Secondary | ICD-10-CM

## 2016-06-07 DIAGNOSIS — I213 ST elevation (STEMI) myocardial infarction of unspecified site: Secondary | ICD-10-CM

## 2016-06-07 NOTE — Progress Notes (Signed)
Discharge Summary  Patient Details  Name: [name] MRN: [id number] Date of Birth: Jul 11, 1956 Referring Provider:   Flowsheet Row CARDIAC REHAB PHASE II ORIENTATION from 02/03/2016 in [facility]  Referring Provider  Larae Grooms MD       Number of Visits: 10  Reason for Discharge:  Early Exit:  [name] will be discharged due to nonattendance   Smoking History:  History  Smoking Status  . Former Smoker  . Packs/day: 0.50  . Types: Cigarettes  . Quit date: 11/28/2015  Smokeless Tobacco  . Never Used    Diagnosis:  ST elevation myocardial infarction (STEMI), unspecified artery (HCC)  S/P CABG x 5  ADL UCSD:   Initial Exercise Prescription:   Discharge Exercise Prescription (Final Exercise Prescription Changes):     Exercise Prescription Changes - 05/14/16 1000      Exercise Review   Progression Yes     Response to Exercise   Blood Pressure (Admit) 124/83   Blood Pressure (Exercise) 142/72   Blood Pressure (Exit) 102/60   Heart Rate (Admit) 98 bpm   Heart Rate (Exercise) 157 bpm   Heart Rate (Exit) 95 bpm   Rating of Perceived Exertion (Exercise) 13   Symptoms none   Comments Reviewed home exercise guidelines on 03/03/16.   Duration Progress to 30 minutes of continuous aerobic without signs/symptoms of physical distress   Intensity THRR unchanged     Progression   Progression Continue to progress workloads to maintain intensity without signs/symptoms of physical distress.   Average METs 4.4     Resistance Training   Training Prescription No   Weight no wts today     Interval Training   Interval Training No     Treadmill   MPH 2.5   Grade 2   Minutes 10   METs 3.6     Bike   Level 2   Minutes 10   METs 5.79     NuStep   Level 4   Minutes 10   METs 3.9     Home Exercise Plan   Plans to continue exercise at Home   Frequency Add 3 additional days to program exercise sessions.      Functional  Capacity:   Psychological, QOL, Others - Outcomes: PHQ 2/9: Depression screen PHQ 2/9 02/09/2016  Decreased Interest 2  Down, Depressed, Hopeless 2  PHQ - 2 Score 4  Altered sleeping 2  Tired, decreased energy 0  Change in appetite 0  Feeling bad or failure about yourself  0  Trouble concentrating 0  Moving slowly or fidgety/restless 0  Suicidal thoughts 0  PHQ-9 Score 6  Difficult doing work/chores Somewhat difficult    Quality of Life:   Personal Goals: Goals established at orientation with interventions provided to work toward goal.    Personal Goals Discharge:     Goals and Risk Factor Review    Row Name 04/14/16 1813 05/14/16 1011           Core Components/Risk Factors/Patient Goals Review   Personal Goals Review Other Other      Review Patient has had health issues that kept him from attending cardiac rehab regularly but retruned to exercise today. Pt has been walking 4 days/ week at home 45-60 minutes.  Patient has been absent from [facility] rehab for personal reasons. Pt has been walking 4 days/ week at home 45-60 minutes.       Expected Outcomes Maintain walking program regularly to help improve  strength and endurance and increase energy. Maintain walking program regularly to help improve strength and endurance and increase energy.         Nutrition & Weight - Outcomes:      Post Biometrics - 04/16/16 1445       Post  Biometrics   Height 5' 9.25" (1.759 m)   Weight 174 lb 13.2 oz (79.3 kg)   BMI (Calculated) 25.7      Nutrition:   Nutrition Discharge:   Education Questionnaire Score:   [name] has not participated in cardiac rehab since 04/16/16 we will discharge [name] from cardiac rehab at this time.Barnet Pall, RN,BSN 06/24/2016 12:07 PM

## 2016-06-10 ENCOUNTER — Encounter (HOSPITAL_BASED_OUTPATIENT_CLINIC_OR_DEPARTMENT_OTHER): Payer: Self-pay | Admitting: Emergency Medicine

## 2016-06-10 ENCOUNTER — Emergency Department (HOSPITAL_BASED_OUTPATIENT_CLINIC_OR_DEPARTMENT_OTHER): Payer: BC Managed Care – PPO

## 2016-06-10 ENCOUNTER — Inpatient Hospital Stay (HOSPITAL_BASED_OUTPATIENT_CLINIC_OR_DEPARTMENT_OTHER)
Admission: EM | Admit: 2016-06-10 | Discharge: 2016-06-15 | DRG: 287 | Disposition: A | Discharge status: Home / Self Care | Payer: BC Managed Care – PPO | Attending: Internal Medicine | Admitting: Internal Medicine

## 2016-06-10 DIAGNOSIS — R079 Chest pain, unspecified: Secondary | ICD-10-CM | POA: Diagnosis present

## 2016-06-10 DIAGNOSIS — K219 Gastro-esophageal reflux disease without esophagitis: Secondary | ICD-10-CM | POA: Diagnosis present

## 2016-06-10 DIAGNOSIS — I10 Essential (primary) hypertension: Secondary | ICD-10-CM | POA: Diagnosis not present

## 2016-06-10 DIAGNOSIS — Z79899 Other long term (current) drug therapy: Secondary | ICD-10-CM

## 2016-06-10 DIAGNOSIS — Z7982 Long term (current) use of aspirin: Secondary | ICD-10-CM

## 2016-06-10 DIAGNOSIS — Z951 Presence of aortocoronary bypass graft: Secondary | ICD-10-CM

## 2016-06-10 DIAGNOSIS — I110 Hypertensive heart disease with heart failure: Secondary | ICD-10-CM | POA: Diagnosis present

## 2016-06-10 DIAGNOSIS — I25110 Atherosclerotic heart disease of native coronary artery with unstable angina pectoris: Principal | ICD-10-CM | POA: Diagnosis present

## 2016-06-10 DIAGNOSIS — Z823 Family history of stroke: Secondary | ICD-10-CM

## 2016-06-10 DIAGNOSIS — Z801 Family history of malignant neoplasm of trachea, bronchus and lung: Secondary | ICD-10-CM

## 2016-06-10 DIAGNOSIS — E7800 Pure hypercholesterolemia, unspecified: Secondary | ICD-10-CM

## 2016-06-10 DIAGNOSIS — Z720 Tobacco use: Secondary | ICD-10-CM

## 2016-06-10 DIAGNOSIS — Z8249 Family history of ischemic heart disease and other diseases of the circulatory system: Secondary | ICD-10-CM

## 2016-06-10 DIAGNOSIS — I25810 Atherosclerosis of coronary artery bypass graft(s) without angina pectoris: Secondary | ICD-10-CM

## 2016-06-10 DIAGNOSIS — I739 Peripheral vascular disease, unspecified: Secondary | ICD-10-CM

## 2016-06-10 DIAGNOSIS — K5900 Constipation, unspecified: Secondary | ICD-10-CM | POA: Diagnosis present

## 2016-06-10 DIAGNOSIS — B1920 Unspecified viral hepatitis C without hepatic coma: Secondary | ICD-10-CM | POA: Diagnosis present

## 2016-06-10 DIAGNOSIS — Z87891 Personal history of nicotine dependence: Secondary | ICD-10-CM

## 2016-06-10 DIAGNOSIS — I5022 Chronic systolic (congestive) heart failure: Secondary | ICD-10-CM | POA: Diagnosis present

## 2016-06-10 DIAGNOSIS — F419 Anxiety disorder, unspecified: Secondary | ICD-10-CM | POA: Diagnosis present

## 2016-06-10 DIAGNOSIS — I252 Old myocardial infarction: Secondary | ICD-10-CM

## 2016-06-10 DIAGNOSIS — Z7902 Long term (current) use of antithrombotics/antiplatelets: Secondary | ICD-10-CM

## 2016-06-10 DIAGNOSIS — I255 Ischemic cardiomyopathy: Secondary | ICD-10-CM | POA: Diagnosis present

## 2016-06-10 LAB — BASIC METABOLIC PANEL
Anion gap: 7 (ref 5–15)
BUN: 20 mg/dL (ref 6–20)
CO2: 29 mmol/L (ref 22–32)
Calcium: 10.5 mg/dL — ABNORMAL HIGH (ref 8.9–10.3)
Chloride: 105 mmol/L (ref 101–111)
Creatinine, Ser: 1.01 mg/dL (ref 0.61–1.24)
GFR calc Af Amer: >60 mL/min (ref >60)
GFR calc non Af Amer: >60 mL/min (ref >60)
Glucose, Bld: 97 mg/dL (ref 65–99)
Potassium: 4.7 mmol/L (ref 3.5–5.1)
Sodium: 141 mmol/L (ref 135–145)

## 2016-06-10 LAB — URINALYSIS, ROUTINE W REFLEX MICROSCOPIC
Bilirubin Urine: NEGATIVE
Glucose, UA: NEGATIVE mg/dL
Hgb urine dipstick: NEGATIVE
Ketones, ur: NEGATIVE mg/dL
Leukocytes, UA: NEGATIVE
Nitrite: NEGATIVE
Protein, ur: NEGATIVE mg/dL
Specific Gravity, Urine: 1.021 (ref 1.005–1.030)
pH: 6 (ref 5.0–8.0)

## 2016-06-10 LAB — CBC
HCT: 42.7 % (ref 39.0–52.0)
Hemoglobin: 14.4 g/dL (ref 13.0–17.0)
MCH: 30.8 pg (ref 26.0–34.0)
MCHC: 33.7 g/dL (ref 30.0–36.0)
MCV: 91.2 fL (ref 78.0–100.0)
Platelets: 213 10*3/uL (ref 150–400)
RBC: 4.68 MIL/uL (ref 4.22–5.81)
RDW: 13.7 % (ref 11.5–15.5)
WBC: 8.8 10*3/uL (ref 4.0–10.5)

## 2016-06-10 LAB — D-DIMER, QUANTITATIVE: D-Dimer, Quant: 0.29 ug/mL-FEU (ref 0.00–0.50)

## 2016-06-10 LAB — TROPONIN I: Troponin I: <0.03 ng/mL (ref <0.03)

## 2016-06-10 MED ORDER — MORPHINE SULFATE (PF) 2 MG/ML IV SOLN
INTRAVENOUS | Status: AC
  Filled 2016-06-10: qty 1

## 2016-06-10 MED ORDER — HYDRALAZINE HCL 20 MG/ML IJ SOLN: 10.0000 mg | INTRAMUSCULAR | Status: DC | PRN

## 2016-06-10 MED ORDER — SODIUM CHLORIDE 0.9 % IV BOLUS (SEPSIS)
1000.0000 mL | Freq: Once | INTRAVENOUS | Status: AC
  Administered 2016-06-10: 1000 mL via INTRAVENOUS

## 2016-06-10 MED ORDER — MORPHINE SULFATE (PF) 2 MG/ML IV SOLN
2.0000 mg | Freq: Once | INTRAVENOUS | Status: AC
  Administered 2016-06-10: 2 mg via INTRAVENOUS

## 2016-06-10 MED ORDER — CHLORDIAZEPOXIDE HCL 25 MG PO CAPS
25.0000 mg | ORAL_CAPSULE | Freq: Every day | ORAL | Status: DC | PRN
  Administered 2016-06-12 – 2016-06-15 (×4): 25 mg via ORAL
  Filled 2016-06-10 (×4): qty 1

## 2016-06-10 MED ORDER — ASPIRIN EC 81 MG PO TBEC
81.0000 mg | DELAYED_RELEASE_TABLET | Freq: Every day | ORAL | Status: DC
  Administered 2016-06-11 – 2016-06-15 (×4): 81 mg via ORAL
  Filled 2016-06-10 (×4): qty 1

## 2016-06-10 MED ORDER — ASPIRIN 81 MG PO CHEW
243.0000 mg | CHEWABLE_TABLET | Freq: Once | ORAL | Status: AC
  Administered 2016-06-10: 243 mg via ORAL
  Filled 2016-06-10: qty 3

## 2016-06-10 MED ORDER — NITROGLYCERIN 0.4 MG SL SUBL
0.4000 mg | SUBLINGUAL_TABLET | SUBLINGUAL | Status: DC | PRN
  Administered 2016-06-11: 0.4 mg via SUBLINGUAL
  Filled 2016-06-10: qty 1

## 2016-06-10 MED ORDER — ONDANSETRON HCL 4 MG/2ML IJ SOLN
4.0000 mg | Freq: Once | INTRAMUSCULAR | Status: AC
  Administered 2016-06-10: 4 mg via INTRAVENOUS
  Filled 2016-06-10: qty 2

## 2016-06-10 MED ORDER — LISINOPRIL 5 MG PO TABS
5.0000 mg | ORAL_TABLET | Freq: Every day | ORAL | Status: DC
  Administered 2016-06-11 – 2016-06-14 (×4): 5 mg via ORAL
  Filled 2016-06-10 (×5): qty 1

## 2016-06-10 MED ORDER — MORPHINE SULFATE (PF) 2 MG/ML IV SOLN
2.0000 mg | INTRAVENOUS | Status: DC | PRN
  Administered 2016-06-10 – 2016-06-14 (×10): 2 mg via INTRAVENOUS
  Filled 2016-06-10 (×11): qty 1

## 2016-06-10 MED ORDER — FAMOTIDINE 20 MG PO TABS
20.0000 mg | ORAL_TABLET | Freq: Two times a day (BID) | ORAL | Status: DC
  Administered 2016-06-11 – 2016-06-15 (×10): 20 mg via ORAL
  Filled 2016-06-10 (×10): qty 1

## 2016-06-10 MED ORDER — CLOPIDOGREL BISULFATE 75 MG PO TABS
75.0000 mg | ORAL_TABLET | Freq: Every day | ORAL | Status: DC
  Administered 2016-06-11 – 2016-06-15 (×5): 75 mg via ORAL
  Filled 2016-06-10 (×5): qty 1

## 2016-06-10 MED ORDER — NITROGLYCERIN 0.4 MG SL SUBL
0.4000 mg | SUBLINGUAL_TABLET | SUBLINGUAL | Status: AC | PRN
  Administered 2016-06-10 (×2): 0.4 mg via SUBLINGUAL
  Filled 2016-06-10: qty 1

## 2016-06-10 MED ORDER — ENOXAPARIN SODIUM 40 MG/0.4ML ~~LOC~~ SOLN: 40.0000 mg | Freq: Every day | SUBCUTANEOUS | Status: DC

## 2016-06-10 MED ORDER — ONDANSETRON HCL 4 MG/2ML IJ SOLN
4.0000 mg | Freq: Four times a day (QID) | INTRAMUSCULAR | Status: DC | PRN
  Administered 2016-06-12 – 2016-06-13 (×2): 4 mg via INTRAVENOUS
  Filled 2016-06-10 (×2): qty 2

## 2016-06-10 MED ORDER — ATORVASTATIN CALCIUM 40 MG PO TABS
40.0000 mg | ORAL_TABLET | Freq: Every day | ORAL | Status: DC
  Administered 2016-06-11 – 2016-06-15 (×5): 40 mg via ORAL
  Filled 2016-06-10 (×5): qty 1

## 2016-06-10 MED ORDER — ZOLPIDEM TARTRATE 5 MG PO TABS
10.0000 mg | ORAL_TABLET | Freq: Every evening | ORAL | Status: DC | PRN
  Administered 2016-06-11 – 2016-06-15 (×4): 10 mg via ORAL
  Filled 2016-06-10 (×4): qty 2

## 2016-06-10 MED ORDER — PANTOPRAZOLE SODIUM 40 MG PO TBEC: 40.0000 mg | DELAYED_RELEASE_TABLET | Freq: Every day | ORAL | Status: DC | PRN

## 2016-06-10 MED ORDER — NITROGLYCERIN 0.4 MG SL SUBL: 0.4000 mg | SUBLINGUAL_TABLET | SUBLINGUAL | Status: DC | PRN

## 2016-06-10 MED ORDER — ACETAMINOPHEN 325 MG PO TABS
650.0000 mg | ORAL_TABLET | ORAL | Status: DC | PRN
  Administered 2016-06-12 – 2016-06-14 (×3): 650 mg via ORAL
  Filled 2016-06-10 (×3): qty 2

## 2016-06-10 NOTE — ED Notes (Signed)
Pt requesting something for pain other than nitro, instructed pt that will will tyr nitro first

## 2016-06-10 NOTE — ED Provider Notes (Signed)
[facility] Provider Note   CSN: [id number] Arrival date & time: 06/10/16  1443     History   Chief Complaint Chief Complaint  Patient presents with  . Chest Pain    HPI [name] is a 60 y.o. male with a past medical history significant for CHF, hepatitis C, and coronary artery disease status post CABG who presents with chest pain, shortness of breath, fatigue, and urinary hesitancy. Patient reports that he had shortness breath and chest pain that has been ongoing since this morning. Patient says that he normally climbs 10 flights of stairs for exercise but today, after the fifth flight, had onset of chest pain and severe shortness of breath. He says it was exertional. He says that it is not as severe as when he had his prior heart attack this year but does report having associated nausea, vomiting, and lightheadedness. He denies pleurisy, leg edema, or leg pain. He denies a history of DVT or PE. He reports that he took a nitroglycerin and has not had relief of pain. He said the pain started as a 5 and is now a 7 out of 10 in severity. He does report some headache. Patient takes aspirin and Plavix and only took his normal baby aspirin today. Said the pain radiates into his left neck and down his left arm. He denies any fevers, chills, or cough. He denies any constipation, diarrhea or dysuria. He does report some mild hesitancy with urination.   Patient  The history is provided by the patient and medical records. No language interpreter was used.  Chest Pain   This is a recurrent problem. The current episode started 6 to 12 hours ago. The problem occurs constantly. The problem has been gradually worsening. The pain is associated with exertion. The pain is present in the substernal region. The pain is at a severity of 7/10. The pain is moderate. The quality of the pain is described as exertional and sharp. The pain radiates to the left jaw, left neck and left arm. Associated  symptoms include headaches, nausea, shortness of breath and vomiting. Pertinent negatives include no abdominal pain, no back pain, no cough, no diaphoresis, no dizziness, no fever, no lower extremity edema, no syncope and no weakness. He has tried rest and nitroglycerin for the symptoms. The treatment provided no relief. Risk factors include male gender.  His past medical history is significant for CAD and CHF.  Pertinent negatives for past medical history include no seizures.  Procedure history is positive for cardiac catheterization.    Past Medical History:  Diagnosis Date  . Acute hepatitis C without mention of hepatic coma(070.51)    Tx by [facility] for Hep.  negative for hep c 2/15.  Marland Kitchen Anxiety   . CAD (coronary artery disease)    a. 10/2015 ant STEMI >> LHC with 3 v CAD; oLAD tx with POBA >> emergent CABG.  . Chronic systolic CHF (congestive heart failure) ([place])   . Colitis   . Esophageal reflux    eosinophil esophagitis  . Former tobacco use   . Ischemic cardiomyopathy    a. EF 25-30% at intraop TEE 4/17  //  b. Limited Echo 5/17 - EF 45-50%, mild ant HK  . Sinus bradycardia    a. HR dropping into 40s in 02/2016 -> BB reduced.  . Symptomatic hypotension    a. 02/2016 ER visit -> meds reduced.    Patient Active Problem List   Diagnosis Date Noted  . Abnormal  nuclear stress test - HIGH RISK 04/20/2016  . ST elevation myocardial infarction (STEMI) of anterior wall, subsequent episode of care ([place])   . Essential hypertension 02/26/2016  . Ischemic cardiomyopathy 12/25/2015  . Dyslipidemia, goal LDL below 70 12/25/2015  . STEMI (ST elevation myocardial infarction) ([place]) 11/28/2015  . 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 Coronary artery disease involving native coronary artery 11/28/2015  . 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 Acute MI anterior wall first episode care [facility])   . Chest pain with high risk for cardiac etiology 03/07/2015  . Dysphagia 03/07/2015  . Gout attack  03/07/2015  . Mixed bipolar I disorder ([place]) 03/07/2015  . Fibromyalgia 07/09/2014  . Gout 07/09/2014  . Anxiety 07/09/2014  . Depression 07/09/2014  . Hepatitis C 11/20/2012  . Eosinophilic esophagitis 95/63/8756    Past Surgical History:  Procedure Laterality Date  . CARDIAC CATHETERIZATION N/A 11/28/2015   Procedure: Left Heart Cath and Coronary Angiography;  Surgeon: [name], MD;  Location: [facility];  Service: Cardiovascular;  Laterality: N/A;  . CARDIAC CATHETERIZATION N/A 11/28/2015   Procedure: Coronary Balloon Angioplasty;  Surgeon: [name], MD;  Location: [place];  Service: Cardiovascular;  Laterality: N/A;  ostial LAD  . CARDIAC CATHETERIZATION N/A 11/28/2015   Procedure: Coronary/Graft Angiography;  Surgeon: [name], MD;  Location: [facility];  Service: Cardiovascular;  Laterality: N/A;  coronaries only   . CARDIAC CATHETERIZATION N/A 04/21/2016   Procedure: Left Heart Cath and Coronary Angiography;  Surgeon: Wellington Hampshire, MD;  Location: [place] [facility];  Service: Cardiovascular;  Laterality: N/A;  . CORONARY ARTERY BYPASS GRAFT N/A 11/28/2015   Procedure: CORONARY ARTERY BYPASS GRAFTING (CABG) TIMES FIVE USING LEFT INTERNAL MAMMARY ARTERY AND RIGHT GREATER SAPHENOUS,VIEN HARVEATED BY ENDOVIEN, INTRAOPPRATIVE TEE;  Surgeon: Gaye Pollack, MD;  Location: [facility];  Service: Open Heart Surgery;  Laterality: N/A;  . SHOULDER SURGERY         Home Medications    Prior to Admission medications   Medication Sig Start Date End Date Taking? Authorizing Provider  aspirin 81 MG tablet Take 81 mg by mouth daily.    Historical Provider, MD  atorvastatin (LIPITOR) 40 MG tablet Take 40 mg by mouth daily.    Historical Provider, MD  chlordiazePOXIDE (LIBRIUM) 25 MG capsule Take 25 mg by mouth daily as needed for anxiety.     Historical Provider, MD  clopidogrel (PLAVIX) 75 MG tablet Take 75 mg by mouth daily.     Historical Provider, MD  lisinopril (PRINIVIL,ZESTRIL) 5 MG tablet Take 5 mg by mouth daily.    Historical Provider, MD  Multiple Vitamins-Minerals (ONE-A-DAY MENS 50+ ADVANTAGE PO) Take 1 tablet by mouth daily.     Historical Provider, MD  nitroGLYCERIN (NITROSTAT) 0.4 MG SL tablet Place 0.4 mg under the tongue every 5 (five) minutes as needed for chest pain. 3 DOSE MAX    Historical Provider, MD  pantoprazole (PROTONIX) 40 MG tablet Take 40 mg by mouth daily as needed (FOR REFLUX). FOR REFLUX    Historical Provider, MD  ranitidine (ZANTAC) 150 MG tablet Take 150 mg by mouth as needed for heartburn.    Historical Provider, MD  zolpidem (AMBIEN) 10 MG tablet Take 10 mg by mouth at bedtime as needed for sleep. Reported on 02/09/2016 11/26/15   Historical Provider, MD    Family History Family History  Problem Relation Age of Onset  . Lung  cancer Mother   . Heart Problems Father   . Heart attack Father 72  . Stroke Father   . Heart attack Maternal Grandmother   . Stroke Maternal Grandmother   . Heart attack Paternal Uncle   . Hypertension Brother   . Autoimmune disease Neg Hx     Social History Social History  Substance Use Topics  . Smoking status: Former Smoker    Packs/day: 0.50    Types: Cigarettes    Quit date: 11/28/2015  . Smokeless tobacco: Never Used  . Alcohol use No     Allergies   Prednisone; Tetanus toxoids; Wellbutrin [bupropion]; and Chantix [varenicline]   Review of Systems Review of Systems  Constitutional: Negative for appetite change, chills, diaphoresis, fatigue and fever.  HENT: Negative for congestion.   Eyes: Negative for visual disturbance.  Respiratory: Positive for chest tightness and shortness of breath. Negative for cough, wheezing and stridor.   Cardiovascular: Positive for chest pain. Negative for syncope.  Gastrointestinal: Positive for nausea and vomiting. Negative for abdominal pain, constipation and diarrhea.  Genitourinary: Positive for  difficulty urinating. Negative for decreased urine volume and flank pain.  Musculoskeletal: Negative for back pain.  Skin: Negative for rash and wound.  Neurological: Positive for light-headedness and headaches. Negative for dizziness, seizures and weakness.  Psychiatric/Behavioral: Negative for agitation.  All other systems reviewed and are negative.    Physical Exam Updated Vital Signs BP 130/89   Pulse 73   Temp 98.3 F (36.8 C)   Resp 20   Ht '5\' 9"'$  (1.753 m)   Wt 175 lb (79.4 kg)   SpO2 99%   BMI 25.84 kg/m   Physical Exam  Constitutional: He is oriented to person, place, and time. He appears well-developed and well-nourished.  HENT:  Head: Normocephalic and atraumatic.  Mouth/Throat: Oropharynx is clear and moist. No oropharyngeal exudate.  Eyes: Conjunctivae are normal.  Neck: Normal range of motion. Neck supple.  Cardiovascular: Normal rate, regular rhythm, normal heart sounds and intact distal pulses.   No murmur heard. Pulmonary/Chest: Effort normal and breath sounds normal. No stridor. No respiratory distress. He has no wheezes. He has no rales. He exhibits no tenderness.  Abdominal: Soft. There is no tenderness.  Musculoskeletal: He exhibits no edema or tenderness.  Neurological: He is alert and oriented to person, place, and time. He has normal reflexes. He displays normal reflexes. No cranial nerve deficit. He exhibits normal muscle tone. Coordination normal.  Skin: Skin is warm and dry. Capillary refill takes less than 2 seconds.  Psychiatric: He has a normal mood and affect.  Nursing note and vitals reviewed.    ED Treatments / Results  Labs (all labs ordered are listed, but only abnormal results are displayed) Labs Reviewed  BASIC METABOLIC PANEL - Abnormal; Notable for the following:       Result Value   Calcium 10.5 (*)    All other components within normal limits  CBC - Abnormal; Notable for the following:    RBC 4.13 (*)    Hemoglobin 12.4 (*)      HCT 37.8 (*)    All other components within normal limits  MRSA PCR SCREENING  CBC  TROPONIN I  URINALYSIS, ROUTINE W REFLEX MICROSCOPIC (NOT AT [facility])  D-DIMER, QUANTITATIVE (NOT AT [facility])  TROPONIN I  CREATININE, SERUM  TROPONIN I  TROPONIN I  RAPID URINE DRUG SCREEN, HOSP PERFORMED    EKG  EKG Interpretation  Date/Time:  Thursday June 10 2016 14:51:58 EDT  Ventricular Rate:  87 PR Interval:  118 QRS Duration: 86 QT Interval:  350 QTC Calculation: 421 R Axis:   86 Text Interpretation:  Normal sinus rhythm with sinus arrhythmia Flattened T wave in V5-V6.  Abnormal ECG Confirmed by Sherry Ruffing MD, [place] [phone]) on 06/10/2016 3:01:15 PM       Radiology Dg Chest 2 View  Result Date: 06/10/2016 CLINICAL DATA:  Chest pain, shortness of breath and nausea EXAM: CHEST  2 VIEW COMPARISON:  04/19/2016 FINDINGS: Median sternotomy wires are evident. No acute infiltrate or effusion. Cardiomediastinal silhouette stable. No pneumothorax. Degenerative changes of the spine. IMPRESSION: No active cardiopulmonary disease. Electronically Signed   By: Donavan Foil M.D.   On: 06/10/2016 15:20    Procedures Procedures (including critical care time)  Medications Ordered in ED Medications  morphine 2 MG/ML injection (not administered)  morphine 2 MG/ML injection 2 mg (2 mg Intravenous Given 06/10/16 2341)  hydrALAZINE (APRESOLINE) injection 10 mg (not administered)  aspirin EC tablet 81 mg (not administered)  atorvastatin (LIPITOR) tablet 40 mg (not administered)  clopidogrel (PLAVIX) tablet 75 mg (not administered)  lisinopril (PRINIVIL,ZESTRIL) tablet 5 mg (5 mg Oral Given 06/11/16 0016)  nitroGLYCERIN (NITROSTAT) SL tablet 0.4 mg (not administered)  famotidine (PEPCID) tablet 20 mg (20 mg Oral Given 06/11/16 0016)  pantoprazole (PROTONIX) EC tablet 40 mg (not administered)  zolpidem (AMBIEN) tablet 10 mg (10 mg Oral Given 06/11/16 0016)  chlordiazePOXIDE (LIBRIUM) capsule 25 mg  (not administered)  acetaminophen (TYLENOL) tablet 650 mg (not administered)  ondansetron (ZOFRAN) injection 4 mg (not administered)  enoxaparin (LOVENOX) injection 40 mg (not administered)  0.9 %  sodium chloride infusion (75 mL/hr Intravenous New Bag/Given 06/11/16 0123)  sodium chloride 0.9 % bolus 1,000 mL (0 mLs Intravenous Stopped 06/10/16 1714)  nitroGLYCERIN (NITROSTAT) SL tablet 0.4 mg (0.4 mg Sublingual Given 06/10/16 1544)  aspirin chewable tablet 243 mg (243 mg Oral Given 06/10/16 1538)  ondansetron (ZOFRAN) injection 4 mg (4 mg Intravenous Given 06/10/16 1539)  sodium chloride 0.9 % bolus 1,000 mL (0 mLs Intravenous Stopped 06/10/16 1947)  morphine 2 MG/ML injection 2 mg (2 mg Intravenous Given 06/10/16 1758)  morphine 2 MG/ML injection (  Duplicate 62/56/38 9373)  morphine 2 MG/ML injection 2 mg (2 mg Intravenous Given 06/10/16 1946)     Initial Impression / Assessment and Plan / ED Course  I have reviewed the triage vital signs and the nursing notes.  Pertinent labs & imaging results that were available during my care of the patient were reviewed by me and considered in my medical decision making (see chart for details).  Clinical Course    [name] is a 60 y.o. male with a past medical history significant for CHF, hepatitis C, and coronary artery disease status post CABG who presents with chest pain, shortness of breath, fatigue, and urinary hesitancy.  History and exam are seen above.  Patient will be given aspirin to make '324mg'$  today. Patient will be given nitroglycerin to try and help pain. Patient's EKG was performed upon arrival and revealed some flattened T waves from prior. No evidence of ST elevation MI. Patient will have laboratory and imaging testing to look for other causes of chest pain however, given patient's exertional shortness of breath and chest pain as well as prior MI with CABG, suspect patient will require admission for further chest pain  workup.  Lab testing was grossly unremarkable with no elevation of troponin, and normal D dimer. No evidence of occult infection.  Patient had improvement in chest pain with Nitro. Patient subsequently also had improvement in pain with morphine.   Given patients recent MI with bypass surgery, patient has high-risk chest pain. Patient admitted to [facility] for further workup of chest pain. Patient transferred in stable condition.   Final Clinical Impressions(s) / ED Diagnoses   Final diagnoses:  Chest pain, unspecified type    Clinical Impression: 1. Chest pain, unspecified type     Disposition: Admit to Hospitalist service    Courtney Paris, MD 06/11/16 [phone]

## 2016-06-10 NOTE — ED Triage Notes (Signed)
Pt in c/o intermittent chest pain x today. Recent tx for STEMI. States pain is pulling in nature to L chest and radiates to L arm and neck. Pt is alert, interactive, ambulatory in NAD.

## 2016-06-10 NOTE — ED Notes (Signed)
MD at bedside. 

## 2016-06-10 NOTE — H&P (Signed)
History and Physical    [name] EVO:[id number] DOB: Dec 08, 1955 DOA: 06/10/2016  PCP: [name], MD  Patient coming from: Home   Chief Complaint: Chest pain   HPI: [name] is a 60 y.o. male with a history of CAD s/p CABG in march of this year presents with chest pain. Patient started experiencing chest pain at around 11 AM at home while exercising associated with shortness of breath dizziness nausea and diaphoresis. Patient said his blood pressure was around 299 systolic. Denies abdominal pain. Symptoms improved with rest. Later in the evening when patient was diriving far to run errands patients symptoms recurred. On presentation to the ER D-dimer, cardiac droppers, and EKG were unremarkable. Chest pain improved with NTG and morphine. Patient admitted for further management of chest pain.  ED Course: In the ER, patient was given aspirin, and NTG.   Review of Systems: As per HPI, rest all negative.   Past Medical History:  Diagnosis Date  . Acute hepatitis C without mention of hepatic coma(070.51)    Tx by [facility] for Hep.  negative for hep c 2/15.  Marland Kitchen Anxiety   . CAD (coronary artery disease)    a. 10/2015 ant STEMI >> LHC with 3 v CAD; oLAD tx with POBA >> emergent CABG.  . Chronic systolic CHF (congestive heart failure) ([place])   . Colitis   . Esophageal reflux    eosinophil esophagitis  . Former tobacco use   . Ischemic cardiomyopathy    a. EF 25-30% at intraop TEE 4/17  //  b. Limited Echo 5/17 - EF 45-50%, mild ant HK  . Sinus bradycardia    a. HR dropping into 40s in 02/2016 -> BB reduced.  . Symptomatic hypotension    a. 02/2016 ER visit -> meds reduced.    Past Surgical History:  Procedure Laterality Date  . CARDIAC CATHETERIZATION N/A 11/28/2015   Procedure: Left Heart Cath and Coronary Angiography;  Surgeon: [name], MD;  Location: [place] [facility];  Service: Cardiovascular;  Laterality: N/A;  . CARDIAC CATHETERIZATION N/A 11/28/2015   Procedure: Coronary Balloon Angioplasty;  Surgeon: [name], MD;  Location: [place] [facility];  Service: Cardiovascular;  Laterality: N/A;  ostial LAD  . CARDIAC CATHETERIZATION N/A 11/28/2015   Procedure: Coronary/Graft Angiography;  Surgeon: [name], MD;  Location: [place] [facility];  Service: Cardiovascular;  Laterality: N/A;  coronaries only   . CARDIAC CATHETERIZATION N/A 04/21/2016   Procedure: Left Heart Cath and Coronary Angiography;  Surgeon: Wellington Hampshire, MD;  Location: [place] [facility];  Service: Cardiovascular;  Laterality: N/A;  . CORONARY ARTERY BYPASS GRAFT N/A 11/28/2015   Procedure: CORONARY ARTERY BYPASS GRAFTING (CABG) TIMES FIVE USING LEFT INTERNAL MAMMARY ARTERY AND RIGHT GREATER SAPHENOUS,VIEN HARVEATED BY ENDOVIEN, INTRAOPPRATIVE TEE;  Surgeon: Gaye Pollack, MD;  Location: [facility];  Service: Open Heart Surgery;  Laterality: N/A;  . SHOULDER SURGERY       reports that he quit smoking about 6 months ago. His smoking use included Cigarettes. He smoked 0.50 packs per day. He has never used smokeless tobacco. He reports that he does not drink alcohol or use drugs.  Allergies  Allergen Reactions  . Prednisone Other (See Comments)    States that this med makes him "crazy"  . Tetanus Toxoids Swelling and Other (See Comments)    Fever, Swelling of the arm   . Wellbutrin [Bupropion] Other (See Comments)    Crazy thoughts, nightmares  .  Chantix [Varenicline] Other (See Comments)    Dreams    Family History  Problem Relation Age of Onset  . Lung cancer Mother   . Heart Problems Father   . Heart attack Father 88  . Stroke Father   . Heart attack Maternal Grandmother   . Stroke Maternal Grandmother   . Heart attack Paternal Uncle   . Hypertension Brother   . Autoimmune disease Neg Hx     Prior to Admission medications   Medication Sig Start Date End Date Taking? Authorizing Provider  aspirin 81 MG tablet Take 81 mg by mouth daily.   Yes  Historical Provider, MD  atorvastatin (LIPITOR) 40 MG tablet Take 40 mg by mouth daily.   Yes Historical Provider, MD  chlordiazePOXIDE (LIBRIUM) 25 MG capsule Take 25 mg by mouth daily as needed for anxiety.    Yes Historical Provider, MD  clopidogrel (PLAVIX) 75 MG tablet Take 75 mg by mouth daily.   Yes Historical Provider, MD  lisinopril (PRINIVIL,ZESTRIL) 5 MG tablet Take 5 mg by mouth daily.   Yes Historical Provider, MD  Multiple Vitamins-Minerals (ONE-A-DAY MENS 50+ ADVANTAGE PO) Take 1 tablet by mouth daily.    Yes Historical Provider, MD  nitroGLYCERIN (NITROSTAT) 0.4 MG SL tablet Place 0.4 mg under the tongue every 5 (five) minutes as needed for chest pain. 3 DOSE MAX   Yes Historical Provider, MD  pantoprazole (PROTONIX) 40 MG tablet Take 40 mg by mouth daily as needed (FOR REFLUX). FOR REFLUX   Yes Historical Provider, MD  ranitidine (ZANTAC) 150 MG tablet Take 150 mg by mouth as needed for heartburn.   Yes Historical Provider, MD  zolpidem (AMBIEN) 10 MG tablet Take 10 mg by mouth at bedtime as needed for sleep. Reported on 02/09/2016 11/26/15  Yes Historical Provider, MD    Physical Exam: Vitals:   06/10/16 1730 06/10/16 1800 06/10/16 1830 06/10/16 2042  BP: 115/72 128/71 128/78 129/80  Pulse: (!) 59 63 (!) 58 69  Resp: '22 11 14   '$ Temp:    98.4 F (36.9 C)  TempSrc:    Oral  SpO2: 100% 100% 100% 99%  Weight:    81.4 kg (179 lb 6.4 oz)  Height:    '5\' 9"'$  (1.753 m)     Vitals:   06/10/16 1730 06/10/16 1800 06/10/16 1830 06/10/16 2042  BP: 115/72 128/71 128/78 129/80  Pulse: (!) 59 63 (!) 58 69  Resp: '22 11 14   '$ Temp:    98.4 F (36.9 C)  TempSrc:    Oral  SpO2: 100% 100% 100% 99%  Weight:    81.4 kg (179 lb 6.4 oz)  Height:    '5\' 9"'$  (1.753 m)   Constitutional: Appears calm and comfortable, well built  Eyes: Anicteric, no pallor  ENMT: No discharge from the ears, eyes, nose, and mouth  Neck: Elevated JVD, no neck rigidity Respiratory: CTA, no wheezes or  rhonchi Cardiovascular: No murmurs, rubs, or gallops  Abdomen: Soft, non tender, and non distended  Musculoskeletal: good ROM, moves all extremities  Skin: No rashes, lesions, or ulcers  Neurological: CN 2-12 grossly intact Psychiatric: Judgement and insight intact, oriented x3.    Labs on Admission: I have personally reviewed following labs and imaging studies  CBC:  Recent Labs Lab 06/10/16 1450  WBC 8.8  HGB 14.4  HCT 42.7  MCV 91.2  PLT 161   Basic Metabolic Panel:  Recent Labs Lab 06/10/16 1450  NA 141  K 4.7  CL 105  CO2 29  GLUCOSE 97  BUN 20  CREATININE 1.01  CALCIUM 10.5*   GFR: Estimated Creatinine Clearance: 76.8 mL/min (by C-G formula based on SCr of 1.01 mg/dL). Liver Function Tests: No results for input(s): AST, ALT, ALKPHOS, BILITOT, PROT, ALBUMIN in the last 168 hours. No results for input(s): LIPASE, AMYLASE in the last 168 hours. No results for input(s): AMMONIA in the last 168 hours. Coagulation Profile: No results for input(s): INR, PROTIME in the last 168 hours. Cardiac Enzymes:  Recent Labs Lab 06/10/16 1450  TROPONINI <0.03   BNP (last 3 results) No results for input(s): PROBNP in the last 8760 hours. HbA1C: No results for input(s): HGBA1C in the last 72 hours. CBG: No results for input(s): GLUCAP in the last 168 hours. Lipid Profile: No results for input(s): CHOL, HDL, LDLCALC, TRIG, CHOLHDL, LDLDIRECT in the last 72 hours. Thyroid Function Tests: No results for input(s): TSH, T4TOTAL, FREET4, T3FREE, THYROIDAB in the last 72 hours. Anemia Panel: No results for input(s): VITAMINB12, FOLATE, FERRITIN, TIBC, IRON, RETICCTPCT in the last 72 hours. Urine analysis:    Component Value Date/Time   COLORURINE YELLOW 06/10/2016 1820   APPEARANCEUR CLEAR 06/10/2016 1820   LABSPEC 1.021 06/10/2016 1820   PHURINE 6.0 06/10/2016 1820   GLUCOSEU NEGATIVE 06/10/2016 1820   HGBUR NEGATIVE 06/10/2016 1820   BILIRUBINUR NEGATIVE 06/10/2016  1820   KETONESUR NEGATIVE 06/10/2016 1820   PROTEINUR NEGATIVE 06/10/2016 1820   UROBILINOGEN 0.2 05/13/2014 0658   NITRITE NEGATIVE 06/10/2016 1820   LEUKOCYTESUR NEGATIVE 06/10/2016 1820   Sepsis Labs: '@LABRCNTIP'$ (procalcitonin:4,lacticidven:4) )No results found for this or any previous visit (from the past 240 hour(s)).   Radiological Exams on Admission: Dg Chest 2 View  Result Date: 06/10/2016 CLINICAL DATA:  Chest pain, shortness of breath and nausea EXAM: CHEST  2 VIEW COMPARISON:  04/19/2016 FINDINGS: Median sternotomy wires are evident. No acute infiltrate or effusion. Cardiomediastinal silhouette stable. No pneumothorax. Degenerative changes of the spine. IMPRESSION: No active cardiopulmonary disease. Electronically Signed   By: Donavan Foil M.D.   On: 06/10/2016 15:20    EKG: Independently reviewed. EKG shows sinus rhythm with premature atrial complexes  Assessment/Plan Principal Problem:   Chest pain Active Problems:   Hepatitis C   Ischemic cardiomyopathy   Essential hypertension    1. Chest pain s/p CABG - Repeat cardiac cath was done in August of 2017 which appeared to be unremarkable .Will cycle cardiac markers and place the patient on NTG and morphine as needed for chest pain. Continue aspirin,plavix, and statins. Control blodd pressure.   2. HTN - Patient states his blood pressure was elevated at home and is now normotensive. Continue lisinopril home dose and hydralazine as needed. Monitor blood pressures closely.  3. Mild hypercalcemia - Maybe due to dehydration. Will gently hydrate and recheck calcium in the AM. Further workup as an outpatient    DVT prophylaxis: Lovenox  Code Status: Full  Family Communication: Discussed with patient   Disposition Plan: Home  Consults called: None  Admission status: Inpatient   Laurey Morale, MD  [facility] If 7PM-7AM, please contact night-coverage [email or web address] Password [email or web address]  06/11/2016, 12:00 AM    By  signing my name below, I, Collene Leyden, attest that this documentation has been prepared under the direction and in the presence of Gean Birchwood, MD. Electronically signed: Collene Leyden, Scribe. 06/11/16

## 2016-06-10 NOTE — Progress Notes (Signed)
Accepted to stepdown obs bed at [facility] from [facility] (Dr. Sherry Ruffing) for chest pain. CP started in am and constant. Hx of CAD with CABG in March 2017. Cath in August with rec for medical mgmt. Initial trop negative and EKG not significantly changed.

## 2016-06-10 NOTE — Progress Notes (Signed)
Patient is c/o 5/10 pain underneath his left chest and neck.  EKG done is NSR with PAC. Oxygen started at 4L. Patient is watching TV and thinks that's what is causing his pain.  Encouraged to turn of TV.

## 2016-06-11 ENCOUNTER — Observation Stay (HOSPITAL_COMMUNITY): Payer: BC Managed Care – PPO

## 2016-06-11 DIAGNOSIS — I739 Peripheral vascular disease, unspecified: Secondary | ICD-10-CM | POA: Diagnosis not present

## 2016-06-11 DIAGNOSIS — I255 Ischemic cardiomyopathy: Secondary | ICD-10-CM | POA: Diagnosis not present

## 2016-06-11 DIAGNOSIS — I200 Unstable angina: Secondary | ICD-10-CM | POA: Diagnosis not present

## 2016-06-11 DIAGNOSIS — E7800 Pure hypercholesterolemia, unspecified: Secondary | ICD-10-CM | POA: Diagnosis not present

## 2016-06-11 DIAGNOSIS — B1920 Unspecified viral hepatitis C without hepatic coma: Secondary | ICD-10-CM

## 2016-06-11 DIAGNOSIS — I10 Essential (primary) hypertension: Secondary | ICD-10-CM | POA: Diagnosis not present

## 2016-06-11 DIAGNOSIS — I25700 Atherosclerosis of coronary artery bypass graft(s), unspecified, with unstable angina pectoris: Secondary | ICD-10-CM | POA: Diagnosis not present

## 2016-06-11 DIAGNOSIS — R079 Chest pain, unspecified: Secondary | ICD-10-CM | POA: Diagnosis not present

## 2016-06-11 DIAGNOSIS — I208 Other forms of angina pectoris: Secondary | ICD-10-CM | POA: Diagnosis not present

## 2016-06-11 LAB — MRSA PCR SCREENING: MRSA by PCR: NEGATIVE

## 2016-06-11 LAB — CBC
HCT: 37.8 % — ABNORMAL LOW (ref 39.0–52.0)
Hemoglobin: 12.4 g/dL — ABNORMAL LOW (ref 13.0–17.0)
MCH: 30 pg (ref 26.0–34.0)
MCHC: 32.8 g/dL (ref 30.0–36.0)
MCV: 91.5 fL (ref 78.0–100.0)
Platelets: 172 10*3/uL (ref 150–400)
RBC: 4.13 MIL/uL — ABNORMAL LOW (ref 4.22–5.81)
RDW: 13.7 % (ref 11.5–15.5)
WBC: 9 10*3/uL (ref 4.0–10.5)

## 2016-06-11 LAB — RAPID URINE DRUG SCREEN, HOSP PERFORMED
Amphetamines: NOT DETECTED
Barbiturates: NOT DETECTED
Benzodiazepines: POSITIVE — AB
Cocaine: NOT DETECTED
Opiates: POSITIVE — AB
Tetrahydrocannabinol: NOT DETECTED

## 2016-06-11 LAB — HEPARIN LEVEL (UNFRACTIONATED): Heparin Unfractionated: 0.4 IU/mL (ref 0.30–0.70)

## 2016-06-11 LAB — CREATININE, SERUM
Creatinine, Ser: 1.01 mg/dL (ref 0.61–1.24)
GFR calc Af Amer: >60 mL/min (ref >60)
GFR calc non Af Amer: >60 mL/min (ref >60)

## 2016-06-11 LAB — TROPONIN I
Troponin I: <0.03 ng/mL (ref <0.03)
Troponin I: <0.03 ng/mL (ref <0.03)
Troponin I: <0.03 ng/mL (ref <0.03)

## 2016-06-11 MED ORDER — SODIUM CHLORIDE 0.9 % IV SOLN
INTRAVENOUS | Status: AC
  Administered 2016-06-11: 75 mL/h via INTRAVENOUS

## 2016-06-11 MED ORDER — HEPARIN (PORCINE) IN NACL 100-0.45 UNIT/ML-% IJ SOLN
1200.0000 [IU]/h | INTRAMUSCULAR | Status: DC
  Administered 2016-06-11: 1100 [IU]/h via INTRAVENOUS
  Administered 2016-06-12: 1200 [IU]/h via INTRAVENOUS
  Filled 2016-06-11 (×2): qty 250

## 2016-06-11 MED ORDER — HEPARIN BOLUS VIA INFUSION
4000.0000 [IU] | Freq: Once | INTRAVENOUS | Status: AC
  Administered 2016-06-11: 4000 [IU] via INTRAVENOUS
  Filled 2016-06-11: qty 4000

## 2016-06-11 NOTE — Progress Notes (Signed)
ANTICOAGULATION CONSULT NOTE - Initial Consult  Pharmacy Consult for heparin Indication: PAD  Allergies  Allergen Reactions  . Prednisone Other (See Comments)    States that this med makes him "crazy"  . Tetanus Toxoids Swelling and Other (See Comments)    Fever, Swelling of the arm   . Wellbutrin [Bupropion] Other (See Comments)    Crazy thoughts, nightmares  . Chantix [Varenicline] Other (See Comments)    Dreams    Patient Measurements: Height: '5\' 9"'$  (175.3 cm) Weight: 179 lb 6.4 oz (81.4 kg) IBW/kg (Calculated) : 70.7   Vital Signs: Temp: 97.6 F (36.4 C) (10/13 0733) Temp Source: Oral (10/13 0733) BP: 123/68 (10/13 0733) Pulse Rate: 71 (10/13 0733)  Labs:  Recent Labs  06/10/16 1450 06/11/16 0117 06/11/16 0601  HGB 14.4 12.4*  --   HCT 42.7 37.8*  --   PLT 213 172  --   CREATININE 1.01 1.01  --   TROPONINI <0.03 <0.03 <0.03    Estimated Creatinine Clearance: 76.8 mL/min (by C-G formula based on SCr of 1.01 mg/dL).   Medical History: Past Medical History:  Diagnosis Date  . Acute hepatitis C without mention of hepatic coma(070.51)    Tx by [facility] for Hep.  negative for hep c 2/15.  Marland Kitchen Anxiety   . CAD (coronary artery disease)    a. 10/2015 ant STEMI >> LHC with 3 v CAD; oLAD tx with POBA >> emergent CABG.  . Chronic systolic CHF (congestive heart failure) ([place])   . Colitis   . Esophageal reflux    eosinophil esophagitis  . Former tobacco use   . Ischemic cardiomyopathy    a. EF 25-30% at intraop TEE 4/17  //  b. Limited Echo 5/17 - EF 45-50%, mild ant HK  . Sinus bradycardia    a. HR dropping into 40s in 02/2016 -> BB reduced.  . Symptomatic hypotension    a. 02/2016 ER visit -> meds reduced.    Medications:  Prescriptions Prior to Admission  Medication Sig Dispense Refill Last Dose  . aspirin 81 MG tablet Take 81 mg by mouth daily.   06/10/2016 at Unknown time  . atorvastatin (LIPITOR) 40 MG tablet Take 40 mg by mouth daily.   06/10/2016 at  Unknown time  . chlordiazePOXIDE (LIBRIUM) 25 MG capsule Take 25 mg by mouth daily as needed for anxiety.    06/09/2016 at Unknown time  . clopidogrel (PLAVIX) 75 MG tablet Take 75 mg by mouth daily.   06/10/2016 at Unknown time  . lisinopril (PRINIVIL,ZESTRIL) 5 MG tablet Take 5 mg by mouth daily.   06/09/2016 at Unknown time  . Multiple Vitamins-Minerals (ONE-A-DAY MENS 50+ ADVANTAGE PO) Take 1 tablet by mouth daily.    06/10/2016 at Unknown time  . nitroGLYCERIN (NITROSTAT) 0.4 MG SL tablet Place 0.4 mg under the tongue every 5 (five) minutes as needed for chest pain. 3 DOSE MAX   06/10/2016 at Unknown time  . pantoprazole (PROTONIX) 40 MG tablet Take 40 mg by mouth daily as needed (FOR REFLUX). FOR REFLUX   Past Week at Unknown time  . ranitidine (ZANTAC) 150 MG tablet Take 150 mg by mouth as needed for heartburn.   06/09/2016 at Unknown time  . zolpidem (AMBIEN) 10 MG tablet Take 10 mg by mouth at bedtime as needed for sleep. Reported on 02/09/2016   06/09/2016 at Unknown time   Scheduled:  . aspirin EC  81 mg Oral Daily  . atorvastatin  40 mg Oral Daily  .  clopidogrel  75 mg Oral Daily  . famotidine  20 mg Oral BID  . lisinopril  5 mg Oral QHS    Assessment: 60 yo male here with CP (hx of CAD w/ CABG) and also noted with PAD. Pharmacy consulted to dose heparin. No anticoagulants noted PTA.   Goal of Therapy:  Heparin level 0.3-0.7 units/ml Monitor platelets by anticoagulation protocol: Yes   Plan:  -Heparin bolus 4000 units IV followed by 1100 units/hr (~ 14 units/kg/hr) -Heparin level in 6hours and daily wth CBC daily  Hildred Laser, Pharm D 06/11/2016 9:30 AM

## 2016-06-11 NOTE — Consult Note (Signed)
CARDIOLOGY CONSULT NOTE   Patient ID: [name] MRN: [id number] DOB/AGE: 1956-01-31 60 y.o.  Admit date: 06/10/2016  Primary Physician   [name], MD Primary Cardiologist   Dr Irish Lack Reason for Consultation   Chest pain Requesting MD: Dr Hal Hope  HWE:[id number] Amsden is a 60 y.o. year old male with a history of CAD (STEMI 10/2015 s/p POBA of LAD with IABP and subsequent emergent CABG), LIMA-LAD, SVG-Diag, SVG-RI-OM, SVG-PDA. HCV s/p treatment, prior tobacco abuse, GERD, anxiety, chronic systolic CHF (most recent EF 45-50%), tobacco abuse (now quit).   D/c 08/23 after admission for chest pain, abnormal stress test>>cath showed occlusion of the SVG-Diag, other grafts patent, EF 50-55%, no PCI. D1 90%.  He has been doing well since then, exercising by running stairs, etc on a regular basis. He will do 10 floors for a workout.  Yesterday, he was exercising and became suddenly SOB. He also had a fluttering in his chest, which he had never had before. He checked his VS and his BP was up but appropriate for exertion. His HR was 110, abnormally high and he believes the machine indicated it was abnormal. The symptoms became worse, 6-7/10 and he went to [facility]. He got SL NTG x 3 and morphine, the pain resolved. It came back twice, each time responding to treatment. Currently pain-free.  He has also been having problems with bilateral thigh aching with exertion, occasional calf cramping with exertion and pain/cold feet, especially at night.  He has noticed a bluish discoloration of his toes.  His heart rate drops at night, into the low 50s, high 40s.   Past Medical History:  Diagnosis Date  . Acute hepatitis C without mention of hepatic coma(070.51)    Tx by [facility] for Hep.  negative for hep c 2/15.  Marland Kitchen Anxiety   . CAD (coronary artery disease)    a. 10/2015 ant STEMI >> LHC with 3 v CAD; oLAD tx with POBA >> emergent CABG.  . Chronic systolic CHF (congestive heart  failure) ([place])   . Colitis   . Esophageal reflux    eosinophil esophagitis  . Former tobacco use   . Ischemic cardiomyopathy    a. EF 25-30% at intraop TEE 4/17  //  b. Limited Echo 5/17 - EF 45-50%, mild ant HK  . Sinus bradycardia    a. HR dropping into 40s in 02/2016 -> BB reduced.  . Symptomatic hypotension    a. 02/2016 ER visit -> meds reduced.     Past Surgical History:  Procedure Laterality Date  . CARDIAC CATHETERIZATION N/A 11/28/2015   Procedure: Left Heart Cath and Coronary Angiography;  Surgeon: [name], MD;  Location: [place] [facility];  Service: Cardiovascular;  Laterality: N/A;  . CARDIAC CATHETERIZATION N/A 11/28/2015   Procedure: Coronary Balloon Angioplasty;  Surgeon: [name], MD;  Location: [place] [facility];  Service: Cardiovascular;  Laterality: N/A;  ostial LAD  . CARDIAC CATHETERIZATION N/A 11/28/2015   Procedure: Coronary/Graft Angiography;  Surgeon: [name], MD;  Location: [place] [facility];  Service: Cardiovascular;  Laterality: N/A;  coronaries only   . CARDIAC CATHETERIZATION N/A 04/21/2016   Procedure: Left Heart Cath and Coronary Angiography;  Surgeon: Wellington Hampshire, MD;  Location: [place] [facility];  Service: Cardiovascular;  Laterality: N/A;  . CORONARY ARTERY BYPASS GRAFT N/A 11/28/2015   Procedure: CORONARY ARTERY BYPASS GRAFTING (CABG) TIMES FIVE USING LEFT INTERNAL MAMMARY ARTERY AND RIGHT GREATER SAPHENOUS,VIEN  HARVEATED BY ENDOVIEN, INTRAOPPRATIVE TEE;  Surgeon: Gaye Pollack, MD;  Location: [place];  Service: Open Heart Surgery;  Laterality: N/A;  . SHOULDER SURGERY      Allergies  Allergen Reactions  . Prednisone Other (See Comments)    States that this med makes him "crazy"  . Tetanus Toxoids Swelling and Other (See Comments)    Fever, Swelling of the arm   . Wellbutrin [Bupropion] Other (See Comments)    Crazy thoughts, nightmares  . Chantix [Varenicline] Other (See Comments)    Dreams    I have  reviewed the patient's current medications . aspirin EC  81 mg Oral Daily  . atorvastatin  40 mg Oral Daily  . clopidogrel  75 mg Oral Daily  . enoxaparin (LOVENOX) injection  40 mg Subcutaneous Daily  . famotidine  20 mg Oral BID  . lisinopril  5 mg Oral QHS   . sodium chloride 75 mL/hr (06/11/16 0123)   acetaminophen, chlordiazePOXIDE, hydrALAZINE, morphine injection, nitroGLYCERIN, ondansetron (ZOFRAN) IV, pantoprazole, zolpidem  Prior to Admission medications   Medication Sig Start Date End Date Taking? Authorizing Provider  aspirin 81 MG tablet Take 81 mg by mouth daily.   Yes Historical Provider, MD  atorvastatin (LIPITOR) 40 MG tablet Take 40 mg by mouth daily.   Yes Historical Provider, MD  chlordiazePOXIDE (LIBRIUM) 25 MG capsule Take 25 mg by mouth daily as needed for anxiety.    Yes Historical Provider, MD  clopidogrel (PLAVIX) 75 MG tablet Take 75 mg by mouth daily.   Yes Historical Provider, MD  lisinopril (PRINIVIL,ZESTRIL) 5 MG tablet Take 5 mg by mouth daily.   Yes Historical Provider, MD  Multiple Vitamins-Minerals (ONE-A-DAY MENS 50+ ADVANTAGE PO) Take 1 tablet by mouth daily.    Yes Historical Provider, MD  nitroGLYCERIN (NITROSTAT) 0.4 MG SL tablet Place 0.4 mg under the tongue every 5 (five) minutes as needed for chest pain. 3 DOSE MAX   Yes Historical Provider, MD  pantoprazole (PROTONIX) 40 MG tablet Take 40 mg by mouth daily as needed (FOR REFLUX). FOR REFLUX   Yes Historical Provider, MD  ranitidine (ZANTAC) 150 MG tablet Take 150 mg by mouth as needed for heartburn.   Yes Historical Provider, MD  zolpidem (AMBIEN) 10 MG tablet Take 10 mg by mouth at bedtime as needed for sleep. Reported on 02/09/2016 11/26/15  Yes Historical Provider, MD     Social History   Social History  . Marital status: Married    Spouse name: [name]  . Number of children: 3  . Years of education: College   Occupational History  . Self-employed Self-Employed   Social History Main  Topics  . Smoking status: Former Smoker    Packs/day: 0.50    Types: Cigarettes    Quit date: 11/28/2015  . Smokeless tobacco: Never Used  . Alcohol use No  . Drug use: No  . Sexual activity: Not on file   Other Topics Concern  . Not on file   Social History Narrative   Patient lives at home with his spouse.   Caffeine Use: yes    Family Status  Relation Status  . Mother Deceased at age 55  . Father Deceased at age 24  . Maternal Grandmother   . Paternal Uncle   . Brother   . Neg Hx    Family History  Problem Relation Age of Onset  . Lung cancer Mother   . Heart Problems Father   . Heart attack Father  21  . Stroke Father   . Heart attack Maternal Grandmother   . Stroke Maternal Grandmother   . Heart attack Paternal Uncle   . Hypertension Brother   . Autoimmune disease Neg Hx      ROS:  Full 14 point review of systems complete and found to be negative unless listed above.  Physical Exam: Blood pressure 123/68, pulse 71, temperature 97.6 F (36.4 C), temperature source Oral, resp. rate 16, height '5\' 9"'$  (1.753 m), weight 179 lb 6.4 oz (81.4 kg), SpO2 98 %.  General: Well developed, well nourished, male in no acute distress Head: Eyes PERRLA, No xanthomas.   Normocephalic and atraumatic, oropharynx without edema or exudate. Dentition: good Lungs: clear bilaterally Heart: HRRR S1 S2, no rub/gallop, no murmur. pulses are 2+ upper extrem.  Unable to palpate DP/PT pulses, femoral pulses palpable, bilateral bruits noted. Neck: No carotid bruits. No lymphadenopathy.  JVD not elevated Abdomen: Bowel sounds present, abdomen soft and non-tender without masses or hernias noted. Msk:  No spine or cva tenderness. No weakness, no joint deformities or effusions. Extremities: No clubbing or cyanosis. No edema. Cyanosis to both feet/toes with delayed capillary refill. Neuro: Alert and oriented X 3. No focal deficits noted. Psych:  Good affect, responds appropriately Skin: No rashes  or lesions noted.  Labs:   Lab Results  Component Value Date   WBC 9.0 06/11/2016   HGB 12.4 (L) 06/11/2016   HCT 37.8 (L) 06/11/2016   MCV 91.5 06/11/2016   PLT 172 06/11/2016    Recent Labs Lab 06/10/16 1450 06/11/16 0117  NA 141  --   K 4.7  --   CL 105  --   CO2 29  --   BUN 20  --   CREATININE 1.01 1.01  CALCIUM 10.5*  --   GLUCOSE 97  --     Recent Labs  06/10/16 1450 06/11/16 0117  TROPONINI <0.03 <0.03   Lab Results  Component Value Date   CHOL 174 04/06/2016   HDL 50 04/06/2016   LDLCALC 109 04/06/2016   TRIG 73 04/06/2016   Lab Results  Component Value Date   DDIMER 0.29 06/10/2016   Echo: 12/2015 - HPI and indications: Limited study. - Left ventricle: The cavity size was normal. Wall thickness was   normal. Systolic function was mildly reduced. The estimated   ejection fraction was in the range of 45% to 50%. Mild anterior   hypokinesis and incoordinate septal motion. The study is not   technically sufficient to allow evaluation of LV diastolic   function. Impressions: - Limited study. Compared to the most recent echo, the LVEF has   improved to 45-50% with residual anterior hypokinesis and   incoordinate septal motion.  ECG:  10/12 SR, slightly flattened T waves V5 & 6 compared to 03/2016.  Diagnostic Diagram 04/21/2016     Left Heart Cath and Coronary Angiography 04/21/16  Prox RCA lesion, 80 %stenosed.  Ost Cx lesion, 90 %stenosed.  Prox Cx lesion, 50 %stenosed.  Ost LAD to Prox LAD lesion, 95 %stenosed.  Ost 1st Diag to 1st Diag lesion, 90 %stenosed.  Mid LAD to Dist LAD lesion, 80 %stenosed.  Mid RCA lesion, 20 %stenosed.  Dist RCA lesion, 40 %stenosed.  LM lesion, 90 %stenosed.  SVG.  Origin to Prox Graft lesion, 40 %stenosed.  SVG.  Origin lesion, 100 %stenosed.  Origin to Prox Graft lesion, 40 %stenosed.  Mid Graft to Dist Graft lesion, 50 %stenosed.  And is anatomically  normal.  The left ventricular  ejection fraction is 50-55% by visual estimate.  The left ventricular systolic function is normal.  LV end diastolic pressure is mildly elevated. 1. Severe underlying three-vessel coronary artery disease with patent grafts except SVG to diagonal. Patent LIMA to LAD, sequential SVG to OM 3/ramus and SVG to right PDA. The diagonals are supplied by retrograde flow from the LAD. Only first diagonal is not supplied. The vein grafts have moderate ectasia with areas of narrowing in between that did not seem to be obstructive. 2. Low normal LV systolic function with an EF of 50-55% with mildly elevated left ventricular end-diastolic pressure. Recommendations: Aggressive medical therapy. No revascularization is advised.  Radiology:  Dg Chest 2 View  Result Date: 06/10/2016 CLINICAL DATA:  Chest pain, shortness of breath and nausea EXAM: CHEST  2 VIEW COMPARISON:  04/19/2016 FINDINGS: Median sternotomy wires are evident. No acute infiltrate or effusion. Cardiomediastinal silhouette stable. No pneumothorax. Degenerative changes of the spine. IMPRESSION: No active cardiopulmonary disease. Electronically Signed   By: Donavan Foil M.D.   On: 06/10/2016 15:20    ASSESSMENT AND PLAN:    Principal Problem: 1.  Chest pain - recurrent, started with palpitations during exertion - improved w/ SL NTG - ez neg so far, ECG w/ no ST elevation, minor T wave changes - may need cath to determine cause of sx - had early graft closure with SVG-Diag 100% 03/2016 - add heparin for now  2.  PAD/claudication - feel this is significant - Dr [name] willing to do cath w/ PV runoff, but is not here till Monday - heparin for now, MD advise further plan  Otherwise, per IM Active Problems:   Hepatitis C   Ischemic cardiomyopathy   Essential hypertension   Signed: Lenoard Aden 06/11/2016 7:56 AM Beeper [phone]  Co-Sign MD  The patient was seen and examined, and I agree with the history, physical  exam, assessment and plan as documented above, with modifications as noted below. Pt normally does 10 flights of stairs but was only able to do 5 flights on Thursday before experiencing chest pressure and SOB. Checked BP and SBP 190 range. Had additional chest pressure this morning. Highest rated 6/10. Has also been experiencing bilateral calf/thigh cramping/aching both with rest and exertion, more so at rest.  ABI's were normal. Troponins normal. ECG unremarkable. BP normal. Patient has been scheduled for both coronary angiography given early graft closure in August, as well as lower extremity angiography with Dr. [name]. Continue ASA, statin, Plavix, and ACEI. I will stop heparin.  Kate Sable, MD, [facility]  06/12/2016 10:44 AM

## 2016-06-11 NOTE — Progress Notes (Signed)
PROGRESS NOTE    [name]  CVE:[id number] DOB: 09-Jul-1956 DOA: 06/10/2016 PCP: [name], MD (Confirm with patient/family/NH records and if not entered, this HAS to be entered at [facility] point of entry. "No PCP" if truly none.)   Brief Narrative: [name] is a 60 y.o. male with a history of CAD s/p CABG in march of this year as well as Hepatitis C, GERD, Anxiety and other comorbids who presents with chest pain. Patient started experiencing chest pain at around 11 AM at home while exercising associated with shortness of breath dizziness nausea and diaphoresis. Patient said his blood pressure was around 025 systolic. Denies abdominal pain. Symptoms improved with rest. Later in the evening when patient was diriving far to run errands patients symptoms recurred. On presentation to the ER D-dimer, cardiac droppers, and EKG were unremarkable. Chest pain improved with NTG and morphine. Patient admitted for further management of chest pain. He was evaluated by Cardiology and they were concerned about PAD and Claudication and patient is to under go Cath with PV runoff.   Assessment & Plan:   Principal Problem:   Chest pain Active Problems:   Hepatitis C   Ischemic cardiomyopathy   Essential hypertension  Chest pain s/p CABG -  -Cardiology Consulted and appreciated Recc's. May need Another Cath -Cardiology Started Anticoagulation with Heparin gtt -Troponin <0.03 x 3 -Repeat cardiac cath was done in August of 2017 and had Early Graft Closur per Cardic Notes -C/w Morphine and SL NTG for pain.  -Continue aspirin '81mg'$ , plavix 75 mg, and Atorvastain 40 mg and Lisinoprol.  -Await Further Recc's per Cards    PAD/Claudication -Cardiology Following. -Ordered ABI's- ABIs and Doppler waveforms are normal bilaterally at rest. -Patient to undergo Cath with PV runoff Monday -On Anticoagulation with Heparin  HTN -  -Continue lisinopril 5 mg home dose and hydralazine as needed.  -Monitor blood  pressures closely.   Mild hypercalcemia  -IVF Rehydration -Recheck BMP in the AM.   Hepatitis C -Stable. Hx of Tx.   GERD -C/w Famotidine and with Pantoprazole 40 mg po Daily prn  DVT prophylaxis:  Heparin gtt Code Status: Full Family Communication:  Disposition Plan: Home at D/C  Consultants:  -Cardiology  Procedures: None Antimicrobials: None  Subjective: Seen and examined this AM and stated CP was still there and came back. Had some N/V. When asked bout feet he states they are usually cooler to the touch. No SOB/ Lightheadedness or Dizziness.   Objective: Vitals:   06/11/16 0419 06/11/16 0453 06/11/16 0733 06/11/16 1211  BP: 96/70 104/79 123/68 104/69  Pulse: (!) 55  71 71  Resp:   16 17  Temp: 97.6 F (36.4 C)  97.6 F (36.4 C) 98.2 F (36.8 C)  TempSrc: Oral  Oral Oral  SpO2: 100%  98% 100%  Weight: 81.4 kg (179 lb 6.4 oz)     Height:        Intake/Output Summary (Last 24 hours) at 06/11/16 1351 Last data filed at 06/11/16 1210  Gross per 24 hour  Intake              200 ml  Output              900 ml  Net             -700 ml   Filed Weights   06/10/16 1448 06/10/16 2042 06/11/16 0419  Weight: 79.4 kg (175 lb) 81.4 kg (179 lb 6.4 oz) 81.4 kg (179 lb 6.4  oz)    Examination: Physical Exam:  Constitutional: WN/WD, NAD  Eyes:  lids and conjunctivae normal, sclerae anicteric  ENMT: External Ears, Nose appear normal. Grossly normal hearing.  Neck: Appears normal, supple, no cervical masses, normal ROM, no appreciable thyromegaly, no JVD Respiratory: Clear to auscultation bilaterally, no wheezing, rales, rhonchi or crackles. Normal respiratory effort and patient is not tachypenic. No accessory muscle use.  Cardiovascular: RRR, no murmurs / rubs / gallops. S1 and S2 auscultated. No extremity edema. Severely diminished pedal pulses.  Abdomen: Soft, non-tender, non-distended. No masses palpated. No appreciable hepatosplenomegaly. Bowel sounds positive.  GU:  Deferred. Musculoskeletal: Cool LE. No joint deformity upper and lower extremities. Good ROM, no contractures. Normal strength and muscle tone.  Skin: No rashes, lesions, ulcers. No induration; Warm and dry.  Neurologic: CN 2-12 grossly intact with no focal deficits. Sensation intact in all 4 Extremities. Strength 5/5 in all 4. Romberg sign cerebellar reflexes not assessed.  Psychiatric: Normal judgment and insight. Alert and oriented x 3. Normal mood and appropriate affect.   Data Reviewed: I have personally reviewed following labs and imaging studies  CBC:  Recent Labs Lab 06/10/16 1450 06/11/16 0117  WBC 8.8 9.0  HGB 14.4 12.4*  HCT 42.7 37.8*  MCV 91.2 91.5  PLT 213 299   Basic Metabolic Panel:  Recent Labs Lab 06/10/16 1450 06/11/16 0117  NA 141  --   K 4.7  --   CL 105  --   CO2 29  --   GLUCOSE 97  --   BUN 20  --   CREATININE 1.01 1.01  CALCIUM 10.5*  --    GFR: Estimated Creatinine Clearance: 76.8 mL/min (by C-G formula based on SCr of 1.01 mg/dL). Liver Function Tests: No results for input(s): AST, ALT, ALKPHOS, BILITOT, PROT, ALBUMIN in the last 168 hours. No results for input(s): LIPASE, AMYLASE in the last 168 hours. No results for input(s): AMMONIA in the last 168 hours. Coagulation Profile: No results for input(s): INR, PROTIME in the last 168 hours. Cardiac Enzymes:  Recent Labs Lab 06/10/16 1450 06/11/16 0117 06/11/16 0601 06/11/16 1135  TROPONINI <0.03 <0.03 <0.03 <0.03   BNP (last 3 results) No results for input(s): PROBNP in the last 8760 hours. HbA1C: No results for input(s): HGBA1C in the last 72 hours. CBG: No results for input(s): GLUCAP in the last 168 hours. Lipid Profile: No results for input(s): CHOL, HDL, LDLCALC, TRIG, CHOLHDL, LDLDIRECT in the last 72 hours. Thyroid Function Tests: No results for input(s): TSH, T4TOTAL, FREET4, T3FREE, THYROIDAB in the last 72 hours. Anemia Panel: No results for input(s): VITAMINB12,  FOLATE, FERRITIN, TIBC, IRON, RETICCTPCT in the last 72 hours. Sepsis Labs: No results for input(s): PROCALCITON, LATICACIDVEN in the last 168 hours.  Recent Results (from the past 240 hour(s))  MRSA PCR Screening     Status: None   Collection Time: 06/11/16 12:07 AM  Result Value Ref Range Status   MRSA by PCR NEGATIVE NEGATIVE Final    Comment:        The GeneXpert MRSA Assay (FDA approved for NASAL specimens only), is one component of a comprehensive MRSA colonization surveillance program. It is not intended to diagnose MRSA infection nor to guide or monitor treatment for MRSA infections.     Radiology Studies: Dg Chest 2 View  Result Date: 06/10/2016 CLINICAL DATA:  Chest pain, shortness of breath and nausea EXAM: CHEST  2 VIEW COMPARISON:  04/19/2016 FINDINGS: Median sternotomy wires are evident. No acute  infiltrate or effusion. Cardiomediastinal silhouette stable. No pneumothorax. Degenerative changes of the spine. IMPRESSION: No active cardiopulmonary disease. Electronically Signed   By: Donavan Foil M.D.   On: 06/10/2016 15:20   Scheduled Meds: . aspirin EC  81 mg Oral Daily  . atorvastatin  40 mg Oral Daily  . clopidogrel  75 mg Oral Daily  . famotidine  20 mg Oral BID  . lisinopril  5 mg Oral QHS   Continuous Infusions: . sodium chloride 75 mL/hr (06/11/16 0123)  . heparin 1,100 Units/hr (06/11/16 1036)     LOS: 0 days   Kerney Elbe, DO [facility] Pager [phone]  If 7PM-7AM, please contact night-coverage [email or web address] Password [email or web address] 06/11/2016, 1:51 PM

## 2016-06-11 NOTE — Progress Notes (Signed)
[place] for heparin Indication: PAD  Allergies  Allergen Reactions  . Prednisone Other (See Comments)    States that this med makes him "crazy"  . Tetanus Toxoids Swelling and Other (See Comments)    Fever, Swelling of the arm   . Wellbutrin [Bupropion] Other (See Comments)    Crazy thoughts, nightmares  . Chantix [Varenicline] Other (See Comments)    Dreams    Patient Measurements: Height: '5\' 9"'$  (175.3 cm) Weight: 179 lb 6.4 oz (81.4 kg) IBW/kg (Calculated) : 70.7   Vital Signs: Temp: 98.4 F (36.9 C) (10/13 1639) Temp Source: Oral (10/13 1639) BP: 95/63 (10/13 1639) Pulse Rate: 64 (10/13 1639)  Labs:  Recent Labs  06/10/16 1450 06/11/16 0117 06/11/16 0601 06/11/16 1135 06/11/16 1713  HGB 14.4 12.4*  --   --   --   HCT 42.7 37.8*  --   --   --   PLT 213 172  --   --   --   HEPARINUNFRC  --   --   --   --  0.40  CREATININE 1.01 1.01  --   --   --   TROPONINI <0.03 <0.03 <0.03 <0.03  --     Estimated Creatinine Clearance: 76.8 mL/min (by C-G formula based on SCr of 1.01 mg/dL).   Medical History: Past Medical History:  Diagnosis Date  . Acute hepatitis C without mention of hepatic coma(070.51)    Tx by [facility] for Hep.  negative for hep c 2/15.  Marland Kitchen Anxiety   . CAD (coronary artery disease)    a. 10/2015 ant STEMI >> LHC with 3 v CAD; oLAD tx with POBA >> emergent CABG.  . Chronic systolic CHF (congestive heart failure) ([place])   . Colitis   . Esophageal reflux    eosinophil esophagitis  . Former tobacco use   . Ischemic cardiomyopathy    a. EF 25-30% at intraop TEE 4/17  //  b. Limited Echo 5/17 - EF 45-50%, mild ant HK  . Sinus bradycardia    a. HR dropping into 40s in 02/2016 -> BB reduced.  . Symptomatic hypotension    a. 02/2016 ER visit -> meds reduced.    Medications:  Prescriptions Prior to Admission  Medication Sig Dispense Refill Last Dose  . aspirin 81 MG tablet Take 81 mg by mouth daily.   06/10/2016 at  Unknown time  . atorvastatin (LIPITOR) 40 MG tablet Take 40 mg by mouth daily.   06/10/2016 at Unknown time  . chlordiazePOXIDE (LIBRIUM) 25 MG capsule Take 25 mg by mouth daily as needed for anxiety.    06/09/2016 at Unknown time  . clopidogrel (PLAVIX) 75 MG tablet Take 75 mg by mouth daily.   06/10/2016 at Unknown time  . lisinopril (PRINIVIL,ZESTRIL) 5 MG tablet Take 5 mg by mouth daily.   06/09/2016 at Unknown time  . Multiple Vitamins-Minerals (ONE-A-DAY MENS 50+ ADVANTAGE PO) Take 1 tablet by mouth daily.    06/10/2016 at Unknown time  . nitroGLYCERIN (NITROSTAT) 0.4 MG SL tablet Place 0.4 mg under the tongue every 5 (five) minutes as needed for chest pain. 3 DOSE MAX   06/10/2016 at Unknown time  . pantoprazole (PROTONIX) 40 MG tablet Take 40 mg by mouth daily as needed (FOR REFLUX). FOR REFLUX   Past Week at Unknown time  . ranitidine (ZANTAC) 150 MG tablet Take 150 mg by mouth as needed for heartburn.   06/09/2016 at Unknown time  . zolpidem (  AMBIEN) 10 MG tablet Take 10 mg by mouth at bedtime as needed for sleep. Reported on 02/09/2016   06/09/2016 at Unknown time   Scheduled:  . aspirin EC  81 mg Oral Daily  . atorvastatin  40 mg Oral Daily  . clopidogrel  75 mg Oral Daily  . famotidine  20 mg Oral BID  . lisinopril  5 mg Oral QHS    Assessment: 60 yo male here with CP (hx of CAD w/ CABG) and also noted with PAD. Pharmacy consulted to dose heparin. No anticoagulants noted PTA.  Initial heparin level = 0.4   Goal of Therapy:  Heparin level 0.3-0.7 units/ml Monitor platelets by anticoagulation protocol: Yes   Plan:  Heparin to 1200 units / hr Follow up AM labs  Thank you Anette Guarneri, PharmD [phone] 06/11/2016 6:07 PM

## 2016-06-11 NOTE — Progress Notes (Signed)
VASCULAR LAB PRELIMINARY  ARTERIAL  ABI completed:    RIGHT    LEFT    PRESSURE WAVEFORM  PRESSURE WAVEFORM  BRACHIAL 101 Triphasic BRACHIAL 96 Triphasic  DP 114 Triphasic DP 106 Triphasic  PT 130 Triphasic PT 131 Triphasic    RIGHT LEFT  ABI 1.29 1.30   ABIs and Doppler waveforms are normal bilaterally at rest  Shahad Mazurek, RVS 06/11/2016, 6:55 PM

## 2016-06-11 NOTE — Progress Notes (Signed)
Patient c/o chest pain 6/10, BP 125/77. Nitro sublingual given x 1, pain still 4/10, BP 99/64. Morphine 2 mg given and was effective. Pain now 0/10.  Will continue to monitor.

## 2016-06-12 DIAGNOSIS — I25810 Atherosclerosis of coronary artery bypass graft(s) without angina pectoris: Secondary | ICD-10-CM

## 2016-06-12 DIAGNOSIS — I25700 Atherosclerosis of coronary artery bypass graft(s), unspecified, with unstable angina pectoris: Secondary | ICD-10-CM

## 2016-06-12 DIAGNOSIS — Z79899 Other long term (current) drug therapy: Secondary | ICD-10-CM | POA: Diagnosis not present

## 2016-06-12 DIAGNOSIS — I252 Old myocardial infarction: Secondary | ICD-10-CM | POA: Diagnosis not present

## 2016-06-12 DIAGNOSIS — Z951 Presence of aortocoronary bypass graft: Secondary | ICD-10-CM | POA: Diagnosis not present

## 2016-06-12 DIAGNOSIS — Z87891 Personal history of nicotine dependence: Secondary | ICD-10-CM | POA: Diagnosis not present

## 2016-06-12 DIAGNOSIS — Z8249 Family history of ischemic heart disease and other diseases of the circulatory system: Secondary | ICD-10-CM | POA: Diagnosis not present

## 2016-06-12 DIAGNOSIS — K5900 Constipation, unspecified: Secondary | ICD-10-CM | POA: Diagnosis not present

## 2016-06-12 DIAGNOSIS — Z7902 Long term (current) use of antithrombotics/antiplatelets: Secondary | ICD-10-CM | POA: Diagnosis not present

## 2016-06-12 DIAGNOSIS — Z801 Family history of malignant neoplasm of trachea, bronchus and lung: Secondary | ICD-10-CM | POA: Diagnosis not present

## 2016-06-12 DIAGNOSIS — I10 Essential (primary) hypertension: Secondary | ICD-10-CM | POA: Diagnosis not present

## 2016-06-12 DIAGNOSIS — B1920 Unspecified viral hepatitis C without hepatic coma: Secondary | ICD-10-CM | POA: Diagnosis not present

## 2016-06-12 DIAGNOSIS — I208 Other forms of angina pectoris: Secondary | ICD-10-CM | POA: Diagnosis not present

## 2016-06-12 DIAGNOSIS — I255 Ischemic cardiomyopathy: Secondary | ICD-10-CM

## 2016-06-12 DIAGNOSIS — F419 Anxiety disorder, unspecified: Secondary | ICD-10-CM | POA: Diagnosis not present

## 2016-06-12 DIAGNOSIS — Z720 Tobacco use: Secondary | ICD-10-CM

## 2016-06-12 DIAGNOSIS — I739 Peripheral vascular disease, unspecified: Secondary | ICD-10-CM | POA: Diagnosis not present

## 2016-06-12 DIAGNOSIS — Z7982 Long term (current) use of aspirin: Secondary | ICD-10-CM | POA: Diagnosis not present

## 2016-06-12 DIAGNOSIS — I200 Unstable angina: Secondary | ICD-10-CM | POA: Diagnosis not present

## 2016-06-12 DIAGNOSIS — E7800 Pure hypercholesterolemia, unspecified: Secondary | ICD-10-CM

## 2016-06-12 DIAGNOSIS — R079 Chest pain, unspecified: Secondary | ICD-10-CM | POA: Diagnosis present

## 2016-06-12 DIAGNOSIS — I5022 Chronic systolic (congestive) heart failure: Secondary | ICD-10-CM | POA: Diagnosis not present

## 2016-06-12 DIAGNOSIS — K219 Gastro-esophageal reflux disease without esophagitis: Secondary | ICD-10-CM | POA: Diagnosis not present

## 2016-06-12 DIAGNOSIS — Z823 Family history of stroke: Secondary | ICD-10-CM | POA: Diagnosis not present

## 2016-06-12 DIAGNOSIS — I25110 Atherosclerotic heart disease of native coronary artery with unstable angina pectoris: Secondary | ICD-10-CM | POA: Diagnosis not present

## 2016-06-12 DIAGNOSIS — I110 Hypertensive heart disease with heart failure: Secondary | ICD-10-CM | POA: Diagnosis not present

## 2016-06-12 LAB — CBC
HCT: 38.2 % — ABNORMAL LOW (ref 39.0–52.0)
Hemoglobin: 12.1 g/dL — ABNORMAL LOW (ref 13.0–17.0)
MCH: 29.5 pg (ref 26.0–34.0)
MCHC: 31.7 g/dL (ref 30.0–36.0)
MCV: 93.2 fL (ref 78.0–100.0)
Platelets: 159 10*3/uL (ref 150–400)
RBC: 4.1 MIL/uL — ABNORMAL LOW (ref 4.22–5.81)
RDW: 13.8 % (ref 11.5–15.5)
WBC: 8.1 10*3/uL (ref 4.0–10.5)

## 2016-06-12 LAB — COMPREHENSIVE METABOLIC PANEL
ALT: 14 U/L — ABNORMAL LOW (ref 17–63)
AST: 15 U/L (ref 15–41)
Albumin: 3.4 g/dL — ABNORMAL LOW (ref 3.5–5.0)
Alkaline Phosphatase: 50 U/L (ref 38–126)
Anion gap: 7 (ref 5–15)
BUN: 13 mg/dL (ref 6–20)
CO2: 25 mmol/L (ref 22–32)
Calcium: 9 mg/dL (ref 8.9–10.3)
Chloride: 108 mmol/L (ref 101–111)
Creatinine, Ser: 0.96 mg/dL (ref 0.61–1.24)
GFR calc Af Amer: >60 mL/min (ref >60)
GFR calc non Af Amer: >60 mL/min (ref >60)
Glucose, Bld: 94 mg/dL (ref 65–99)
Potassium: 3.9 mmol/L (ref 3.5–5.1)
Sodium: 140 mmol/L (ref 135–145)
Total Bilirubin: 0.5 mg/dL (ref 0.3–1.2)
Total Protein: 5.4 g/dL — ABNORMAL LOW (ref 6.5–8.1)

## 2016-06-12 LAB — PHOSPHORUS: Phosphorus: 3.9 mg/dL (ref 2.5–4.6)

## 2016-06-12 LAB — MAGNESIUM: Magnesium: 2 mg/dL (ref 1.7–2.4)

## 2016-06-12 LAB — HEPARIN LEVEL (UNFRACTIONATED): Heparin Unfractionated: 0.63 IU/mL (ref 0.30–0.70)

## 2016-06-12 MED ORDER — SENNA 8.6 MG PO TABS
1.0000 | ORAL_TABLET | Freq: Every day | ORAL | Status: DC
  Administered 2016-06-12 – 2016-06-13 (×2): 8.6 mg via ORAL
  Filled 2016-06-12 (×3): qty 1

## 2016-06-12 MED ORDER — HEPARIN SODIUM (PORCINE) 5000 UNIT/ML IJ SOLN
5000.0000 [IU] | Freq: Three times a day (TID) | INTRAMUSCULAR | Status: DC
  Administered 2016-06-12 – 2016-06-14 (×5): 5000 [IU] via SUBCUTANEOUS
  Filled 2016-06-12 (×5): qty 1

## 2016-06-12 MED ORDER — POLYETHYLENE GLYCOL 3350 17 G PO PACK
17.0000 g | PACK | Freq: Every day | ORAL | Status: DC
  Administered 2016-06-12 – 2016-06-13 (×2): 17 g via ORAL
  Filled 2016-06-12 (×3): qty 1

## 2016-06-12 NOTE — Progress Notes (Signed)
[place] for heparin Indication: PAD, CP  Allergies  Allergen Reactions  . Prednisone Other (See Comments)    States that this med makes him "crazy"  . Tetanus Toxoids Swelling and Other (See Comments)    Fever, Swelling of the arm   . Wellbutrin [Bupropion] Other (See Comments)    Crazy thoughts, nightmares  . Chantix [Varenicline] Other (See Comments)    Dreams    Patient Measurements: Height: '5\' 9"'$  (175.3 cm) Weight: 180 lb 8.9 oz (81.9 kg) IBW/kg (Calculated) : 70.7   Vital Signs: Temp: 98.6 F (37 C) (10/14 0746) Temp Source: Oral (10/14 0746) BP: 107/66 (10/14 0746) Pulse Rate: 66 (10/14 0746)  Labs:  Recent Labs  06/10/16 1450 06/11/16 0117 06/11/16 0601 06/11/16 1135 06/11/16 1713 06/12/16 0503  HGB 14.4 12.4*  --   --   --  12.1*  HCT 42.7 37.8*  --   --   --  38.2*  PLT 213 172  --   --   --  159  HEPARINUNFRC  --   --   --   --  0.40 0.63  CREATININE 1.01 1.01  --   --   --  0.96  TROPONINI <0.03 <0.03 <0.03 <0.03  --   --     Estimated Creatinine Clearance: 80.8 mL/min (by C-G formula based on SCr of 0.96 mg/dL).   Medical History: Past Medical History:  Diagnosis Date  . Acute hepatitis C without mention of hepatic coma(070.51)    Tx by [facility] for Hep.  negative for hep c 2/15.  Marland Kitchen Anxiety   . CAD (coronary artery disease)    a. 10/2015 ant STEMI >> LHC with 3 v CAD; oLAD tx with POBA >> emergent CABG.  . Chronic systolic CHF (congestive heart failure) ([place])   . Colitis   . Esophageal reflux    eosinophil esophagitis  . Former tobacco use   . Ischemic cardiomyopathy    a. EF 25-30% at intraop TEE 4/17  //  b. Limited Echo 5/17 - EF 45-50%, mild ant HK  . Sinus bradycardia    a. HR dropping into 40s in 02/2016 -> BB reduced.  . Symptomatic hypotension    a. 02/2016 ER visit -> meds reduced.    Medications:  Prescriptions Prior to Admission  Medication Sig Dispense Refill Last Dose  . aspirin 81 MG  tablet Take 81 mg by mouth daily.   06/10/2016 at Unknown time  . atorvastatin (LIPITOR) 40 MG tablet Take 40 mg by mouth daily.   06/10/2016 at Unknown time  . chlordiazePOXIDE (LIBRIUM) 25 MG capsule Take 25 mg by mouth daily as needed for anxiety.    06/09/2016 at Unknown time  . clopidogrel (PLAVIX) 75 MG tablet Take 75 mg by mouth daily.   06/10/2016 at Unknown time  . lisinopril (PRINIVIL,ZESTRIL) 5 MG tablet Take 5 mg by mouth daily.   06/09/2016 at Unknown time  . Multiple Vitamins-Minerals (ONE-A-DAY MENS 50+ ADVANTAGE PO) Take 1 tablet by mouth daily.    06/10/2016 at Unknown time  . nitroGLYCERIN (NITROSTAT) 0.4 MG SL tablet Place 0.4 mg under the tongue every 5 (five) minutes as needed for chest pain. 3 DOSE MAX   06/10/2016 at Unknown time  . pantoprazole (PROTONIX) 40 MG tablet Take 40 mg by mouth daily as needed (FOR REFLUX). FOR REFLUX   Past Week at Unknown time  . ranitidine (ZANTAC) 150 MG tablet Take 150 mg by mouth as needed  for heartburn.   06/09/2016 at Unknown time  . zolpidem (AMBIEN) 10 MG tablet Take 10 mg by mouth at bedtime as needed for sleep. Reported on 02/09/2016   06/09/2016 at Unknown time   Scheduled:  . aspirin EC  81 mg Oral Daily  . atorvastatin  40 mg Oral Daily  . clopidogrel  75 mg Oral Daily  . famotidine  20 mg Oral BID  . lisinopril  5 mg Oral QHS    Assessment: 60 yo male presents with CP (hx of CAD w/ CABG) and also noted with PAD. Pharmacy consulted to dose heparin with plans for cath lab on 10/16. No anticoagulants noted PTA. Anti-Xa level therapeutic x2 at 0.63 this morning. Hgb stable, pltc 159.    Goal of Therapy:  Heparin level 0.3-0.7 units/ml Monitor platelets by anticoagulation protocol: Yes   Plan:  -Continue heparin 1200 units/hr -Monitor daily anti-Xa, CBC, S/Sx bleeding  Arrie Senate, PharmD PGY-1 Pharmacy Resident Pager: [phone] 06/12/2016

## 2016-06-12 NOTE — Progress Notes (Signed)
PROGRESS NOTE    [name]  GUY:[id number] DOB: May 10, 1956 DOA: 06/10/2016 PCP: [name], MD   Brief Narrative: [name] is a 60 y.o. male with a history of CAD s/p CABG in march of this year as well as Hepatitis C, GERD, Anxiety and other comorbids who presents with chest pain. Patient started experiencing chest pain at around 11 AM at home while exercising associated with shortness of breath dizziness nausea and diaphoresis. Patient said his blood pressure was around 563 systolic. Denies abdominal pain. Symptoms improved with rest. Later in the evening when patient was diriving far to run errands patients symptoms recurred. On presentation to the ER D-dimer, cardiac droppers, and EKG were unremarkable. Chest pain improved with NTG and morphine. Patient admitted for further management of chest pain. He was evaluated by Cardiology and they were concerned about PAD and Claudication and patient is scheduled to undergo coronary angiography given his early graft closure in August along with Lower Extremity Angiography and PV runoff on Monday.   Assessment & Plan:   Principal Problem:   Chest pain Active Problems:   Hepatitis C   Ischemic cardiomyopathy   Essential hypertension   Claudication of both lower extremities (HCC)   Pure hypercholesterolemia   Tobacco abuse disorder   Coronary artery disease involving coronary bypass graft of native heart with unstable angina pectoris (HCC)  Chest pain s/p CABG -  -Cardiology Consulted and appreciated Recc's -Cardiology D/C'd Anticoagulation with Heparin gtt -Troponin <0.03 x 3 -Repeat cardiac cath was done in August of 2017 and had Early Graft Closure per Cardic Notes -C/w Morphine and SL NTG for pain.  -Continue aspirin '81mg'$ , plavix 75 mg, and Atorvastain 40 mg and Lisinoprol.  -To undergo Coronary Angiography and Cardiac Catheterization on Monday by Dr. [name]. -Await Further Recc's per Cards    PAD/Claudication -Cardiology  Following. -Ordered ABI's- ABIs and Doppler waveforms are normal bilaterally at rest. -Patient to undergo Lower Extremity Angiography and Cath with PV runoff Monday -No longer on Anticoagulation with Heparin because D/C'd by Cardiology.   HTN -  -Continue Lisinopril 5 mg home dose and hydralazine as needed.  -Monitor blood pressures closely.   Constipation -Likely 2/2 to Opiates -Gave Patient Senna and Miralax  Mild hypercalcemia, resolved. -Calcium Level was 9.0 this AM -IVF Rehydration D/C'd. -Recheck BMP in the AM.   Hepatitis C -Stable. Hx of Tx.   GERD -C/w Famotidine and with Pantoprazole 40 mg po Daily prn  DVT prophylaxis:  Heparin gtt D/C'd; Started sq 5,000 Code Status: Full Family Communication:  Disposition Plan: Home at D/C  Consultants:  -Cardiology  Procedures: None Antimicrobials: None  Subjective: Seen and examined this AM and stated CP was starting to came back. States his feet had more color in them today but admits to severe pain occasionally. No SOB/ Lightheadedness or Dizziness. No other complaints or concerns and understands the plan for Cath on Monday.   Objective: Vitals:   06/12/16 0430 06/12/16 0746 06/12/16 1218 06/12/16 1652  BP: 111/64 107/66 117/85 120/71  Pulse: 69 66 75 65  Resp: '18 18 16 16  '$ Temp: 98 F (36.7 C) 98.6 F (37 C) 98.9 F (37.2 C) 98.4 F (36.9 C)  TempSrc: Oral Oral Oral Oral  SpO2: 99% 100% 100% 98%  Weight: 81.9 kg (180 lb 8.9 oz)     Height:        Intake/Output Summary (Last 24 hours) at 06/12/16 1902 Last data filed at 06/12/16 1219  Gross per 24  hour  Intake            142.4 ml  Output             1100 ml  Net           -957.6 ml   Filed Weights   06/10/16 2042 06/11/16 0419 06/12/16 0430  Weight: 81.4 kg (179 lb 6.4 oz) 81.4 kg (179 lb 6.4 oz) 81.9 kg (180 lb 8.9 oz)    Examination: Physical Exam:  Constitutional: WN/WD, NAD  Eyes:  lids and conjunctivae normal, sclerae anicteric  ENMT:  External Ears, Nose appear normal. Grossly normal hearing.  Neck: Appears normal, supple, no cervical masses, normal ROM, no appreciable thyromegaly, no JVD Respiratory: Clear to auscultation bilaterally, no wheezing, rales, rhonchi or crackles. Normal respiratory effort and patient is not tachypenic. No accessory muscle use.  Cardiovascular: RRR, no murmurs / rubs / gallops. S1 and S2 auscultated. No extremity edema. Severely diminished pedal pulses.  Abdomen: Soft, non-tender, non-distended. No masses palpated. No appreciable hepatosplenomegaly. Bowel sounds positive.  GU: Deferred. Musculoskeletal: Cool LE. No joint deformity upper and lower extremities. Good ROM, no contractures. Normal strength and muscle tone.  Skin: No rashes, lesions, ulcers. No induration; Warm and dry.  Neurologic: CN 2-12 grossly intact with no focal deficits. Sensation intact in all 4 Extremities. Strength 5/5 in all 4. Romberg sign cerebellar reflexes not assessed.  Psychiatric: Normal judgment and insight. Alert and oriented x 3. Normal mood and appropriate affect.   Data Reviewed: I have personally reviewed following labs and imaging studies  CBC:  Recent Labs Lab 06/10/16 1450 06/11/16 0117 06/12/16 0503  WBC 8.8 9.0 8.1  HGB 14.4 12.4* 12.1*  HCT 42.7 37.8* 38.2*  MCV 91.2 91.5 93.2  PLT 213 172 166   Basic Metabolic Panel:  Recent Labs Lab 06/10/16 1450 06/11/16 0117 06/12/16 0503  NA 141  --  140  K 4.7  --  3.9  CL 105  --  108  CO2 29  --  25  GLUCOSE 97  --  94  BUN 20  --  13  CREATININE 1.01 1.01 0.96  CALCIUM 10.5*  --  9.0  MG  --   --  2.0  PHOS  --   --  3.9   GFR: Estimated Creatinine Clearance: 80.8 mL/min (by C-G formula based on SCr of 0.96 mg/dL). Liver Function Tests:  Recent Labs Lab 06/12/16 0503  AST 15  ALT 14*  ALKPHOS 50  BILITOT 0.5  PROT 5.4*  ALBUMIN 3.4*   No results for input(s): LIPASE, AMYLASE in the last 168 hours. No results for input(s):  AMMONIA in the last 168 hours. Coagulation Profile: No results for input(s): INR, PROTIME in the last 168 hours. Cardiac Enzymes:  Recent Labs Lab 06/10/16 1450 06/11/16 0117 06/11/16 0601 06/11/16 1135  TROPONINI <0.03 <0.03 <0.03 <0.03   BNP (last 3 results) No results for input(s): PROBNP in the last 8760 hours. HbA1C: No results for input(s): HGBA1C in the last 72 hours. CBG: No results for input(s): GLUCAP in the last 168 hours. Lipid Profile: No results for input(s): CHOL, HDL, LDLCALC, TRIG, CHOLHDL, LDLDIRECT in the last 72 hours. Thyroid Function Tests: No results for input(s): TSH, T4TOTAL, FREET4, T3FREE, THYROIDAB in the last 72 hours. Anemia Panel: No results for input(s): VITAMINB12, FOLATE, FERRITIN, TIBC, IRON, RETICCTPCT in the last 72 hours. Sepsis Labs: No results for input(s): PROCALCITON, LATICACIDVEN in the last 168 hours.  Recent Results (from  the past 240 hour(s))  MRSA PCR Screening     Status: None   Collection Time: 06/11/16 12:07 AM  Result Value Ref Range Status   MRSA by PCR NEGATIVE NEGATIVE Final    Comment:        The GeneXpert MRSA Assay (FDA approved for NASAL specimens only), is one component of a comprehensive MRSA colonization surveillance program. It is not intended to diagnose MRSA infection nor to guide or monitor treatment for MRSA infections.     Radiology Studies: No results found. Scheduled Meds: . aspirin EC  81 mg Oral Daily  . atorvastatin  40 mg Oral Daily  . clopidogrel  75 mg Oral Daily  . famotidine  20 mg Oral BID  . heparin subcutaneous  5,000 Units Subcutaneous Q8H  . lisinopril  5 mg Oral QHS  . polyethylene glycol  17 g Oral Daily  . senna  1 tablet Oral Daily   Continuous Infusions:    LOS: 0 days   Kerney Elbe, DO [facility] Pager [phone]  If 7PM-7AM, please contact night-coverage [email or web address] Password [email or web address] 06/12/2016, 7:02 PM

## 2016-06-13 DIAGNOSIS — I200 Unstable angina: Secondary | ICD-10-CM

## 2016-06-13 LAB — CBC
HCT: 37.4 % — ABNORMAL LOW (ref 39.0–52.0)
Hemoglobin: 12.1 g/dL — ABNORMAL LOW (ref 13.0–17.0)
MCH: 30 pg (ref 26.0–34.0)
MCHC: 32.4 g/dL (ref 30.0–36.0)
MCV: 92.6 fL (ref 78.0–100.0)
Platelets: 168 10*3/uL (ref 150–400)
RBC: 4.04 MIL/uL — ABNORMAL LOW (ref 4.22–5.81)
RDW: 13.4 % (ref 11.5–15.5)
WBC: 7.2 10*3/uL (ref 4.0–10.5)

## 2016-06-13 NOTE — Progress Notes (Signed)
PROGRESS NOTE    [name]  CZY:[id number] DOB: 1956/07/03 DOA: 06/10/2016 PCP: [name], MD   Brief Narrative: [name] is a 60 y.o. male with a history of CAD s/p CABG in march of this year as well as Hepatitis C, GERD, Anxiety and other comorbids who presented with chest pain. Patient started experiencing chest pain at around 11 AM at home while exercising associated with shortness of breath dizziness nausea and diaphoresis. Patient said his blood pressure was around 093 systolic. Denies abdominal pain. Symptoms improved with rest. Later in the evening when patient was diriving far to run errands patients symptoms recurred so he decided to come to the ER for evaluation. On presentation to the ER D-dimer, cardiac droppers, and EKG were unremarkable. Chest pain improved with NTG and morphine. Patient admitted for further management of chest pain. He was evaluated by Cardiology and they were concerned about PAD and Claudication and patient is scheduled to undergo coronary angiography given his early graft closure in August along with Lower Extremity Angiography and PV runoff on Monday by Dr. [name].  Assessment & Plan:   Principal Problem:   Chest pain Active Problems:   Hepatitis C   Ischemic cardiomyopathy   Essential hypertension   Claudication of both lower extremities (HCC)   Pure hypercholesterolemia   Tobacco abuse disorder   Coronary artery disease involving coronary bypass graft of native heart with unstable angina pectoris (HCC)  Chest pain s/p CABG -  -Patient states it still hurts intermittently -Cardiology Consulted and appreciated Recc's -Cardiology D/C'd Anticoagulation with Heparin gtt;  -Troponin <0.03 x 3 -Repeat cardiac cath was done in August of 2017 and had Early Graft Closure per Cardic Notes -C/w Morphine and SL NTG for pain.  -Continue aspirin '81mg'$ , plavix 75 mg, and Atorvastain 40 mg and Lisinoprol.  -To undergo Coronary Angiography and Cardiac  Catheterization tomorrow (10/16) by Dr. [name]. -Await Further Recc's per Cards    PAD/Claudication -Cardiology Following. -Ordered ABI's- ABIs and Doppler waveforms are normal bilaterally at rest. -Patient to undergo Lower Extremity Angiography and Cath with PV runoff tomorrow (10/16) -No longer on Anticoagulation with Heparin because D/C'd by Cardiology.   HTN -  -Continue Lisinopril 5 mg home dose and hydralazine as needed.  -Monitor blood pressures closely.   Constipation -Likely 2/2 to Opiates -Gave Patient Senna and Miralax; Stated improvement of Symptoms  Hepatitis C -Stable. Hx of Tx.   GERD -C/w Famotidine and with Pantoprazole 40 mg po Daily prn  DVT prophylaxis:  Heparin sq 5,000 Code Status: Full Family Communication: No Family at bedside.  Disposition Plan: Home at D/C  Consultants:  -Cardiology  Procedures: None- Will have Coronary and Lower Extremity Angiography tomorrow Antimicrobials: None  Subjective: Seen and examined this AM and stated his constipation improved with Senna and Miralax. Awaiting Cath and Lower Extremity Angiography by Dr. [name] in Am. Admitted to mild CP but no SOB, N/V or Abdominal Pain. No other complaints or concerns at this time.   Objective: Vitals:   06/12/16 2106 06/13/16 0101 06/13/16 0409 06/13/16 1331  BP: 109/64 (!) 117/97 123/77 111/68  Pulse:  63 64 68  Resp:    17  Temp:  98.2 F (36.8 C) 97.7 F (36.5 C) 98.4 F (36.9 C)  TempSrc:  Oral Oral Oral  SpO2:  98% 100% 100%  Weight:   81.4 kg (179 lb 6.4 oz)   Height:        Intake/Output Summary (Last 24 hours) at 06/13/16 1448  Last data filed at 06/13/16 0900  Gross per 24 hour  Intake              240 ml  Output                0 ml  Net              240 ml   Filed Weights   06/11/16 0419 06/12/16 0430 06/13/16 0409  Weight: 81.4 kg (179 lb 6.4 oz) 81.9 kg (180 lb 8.9 oz) 81.4 kg (179 lb 6.4 oz)    Examination: Physical Exam:  Constitutional: WN/WD, NAD    Eyes:  Lids and conjunctivae normal, sclerae anicteric  ENMT: External Ears, Nose appear normal. Grossly normal hearing.  Neck: Appears normal, supple, no cervical masses, normal ROM, no appreciable thyromegaly, no JVD Respiratory: Clear to auscultation bilaterally, no wheezing, rales, rhonchi or crackles. Normal respiratory effort and patient is not tachypenic. No accessory muscle use.  Cardiovascular: RRR, no murmurs / rubs / gallops. S1 and S2 auscultated. No extremity edema. Diminished pedal pulses.  Abdomen: Soft, non-tender, non-distended. No masses palpated. No appreciable hepatosplenomegaly. Bowel sounds positive.  GU: Deferred. Musculoskeletal: Warmer LE today. No joint deformity upper and lower extremities. Good ROM, no contractures. Normal strength and muscle tone.  Skin: No rashes, lesions, ulcers. No induration; Warm and dry.  Neurologic: CN 2-12 grossly intact with no focal deficits. Sensation intact in all 4 Extremities. Strength 5/5 in all 4. Romberg sign cerebellar reflexes not assessed.  Psychiatric: Normal judgment and insight. Alert and oriented x 3. Normal mood and appropriate affect.   Data Reviewed: I have personally reviewed following labs and imaging studies  CBC:  Recent Labs Lab 06/10/16 1450 06/11/16 0117 06/12/16 0503 06/13/16 0345  WBC 8.8 9.0 8.1 7.2  HGB 14.4 12.4* 12.1* 12.1*  HCT 42.7 37.8* 38.2* 37.4*  MCV 91.2 91.5 93.2 92.6  PLT 213 172 159 195   Basic Metabolic Panel:  Recent Labs Lab 06/10/16 1450 06/11/16 0117 06/12/16 0503  NA 141  --  140  K 4.7  --  3.9  CL 105  --  108  CO2 29  --  25  GLUCOSE 97  --  94  BUN 20  --  13  CREATININE 1.01 1.01 0.96  CALCIUM 10.5*  --  9.0  MG  --   --  2.0  PHOS  --   --  3.9   GFR: Estimated Creatinine Clearance: 80.8 mL/min (by C-G formula based on SCr of 0.96 mg/dL). Liver Function Tests:  Recent Labs Lab 06/12/16 0503  AST 15  ALT 14*  ALKPHOS 50  BILITOT 0.5  PROT 5.4*  ALBUMIN  3.4*   No results for input(s): LIPASE, AMYLASE in the last 168 hours. No results for input(s): AMMONIA in the last 168 hours. Coagulation Profile: No results for input(s): INR, PROTIME in the last 168 hours. Cardiac Enzymes:  Recent Labs Lab 06/10/16 1450 06/11/16 0117 06/11/16 0601 06/11/16 1135  TROPONINI <0.03 <0.03 <0.03 <0.03   BNP (last 3 results) No results for input(s): PROBNP in the last 8760 hours. HbA1C: No results for input(s): HGBA1C in the last 72 hours. CBG: No results for input(s): GLUCAP in the last 168 hours. Lipid Profile: No results for input(s): CHOL, HDL, LDLCALC, TRIG, CHOLHDL, LDLDIRECT in the last 72 hours. Thyroid Function Tests: No results for input(s): TSH, T4TOTAL, FREET4, T3FREE, THYROIDAB in the last 72 hours. Anemia Panel: No results for input(s): VITAMINB12, FOLATE,  FERRITIN, TIBC, IRON, RETICCTPCT in the last 72 hours. Sepsis Labs: No results for input(s): PROCALCITON, LATICACIDVEN in the last 168 hours.  Recent Results (from the past 240 hour(s))  MRSA PCR Screening     Status: None   Collection Time: 06/11/16 12:07 AM  Result Value Ref Range Status   MRSA by PCR NEGATIVE NEGATIVE Final    Comment:        The GeneXpert MRSA Assay (FDA approved for NASAL specimens only), is one component of a comprehensive MRSA colonization surveillance program. It is not intended to diagnose MRSA infection nor to guide or monitor treatment for MRSA infections.     Radiology Studies: No results found. Scheduled Meds: . aspirin EC  81 mg Oral Daily  . atorvastatin  40 mg Oral Daily  . clopidogrel  75 mg Oral Daily  . famotidine  20 mg Oral BID  . heparin subcutaneous  5,000 Units Subcutaneous Q8H  . lisinopril  5 mg Oral QHS  . polyethylene glycol  17 g Oral Daily  . senna  1 tablet Oral Daily   Continuous Infusions:    LOS: 1 day   Kerney Elbe, DO [facility] Pager [phone]  If 7PM-7AM, please contact  night-coverage [email or web address] Password [email or web address] 06/13/2016, 2:48 PM

## 2016-06-13 NOTE — Progress Notes (Signed)
Patient Name: [name] Date of Encounter: 06/13/2016  Primary Cardiologist: [facility] Problem List     Principal Problem:   Chest pain Active Problems:   Hepatitis C   Ischemic cardiomyopathy   Essential hypertension   Claudication of both lower extremities (HCC)   Pure hypercholesterolemia   Tobacco abuse disorder   Coronary artery disease involving coronary bypass graft of native heart with unstable angina pectoris (HCC)     Subjective   Had intermittent chest pain overnight, none now. Has several questions about cath.  Inpatient Medications    Scheduled Meds: . aspirin EC  81 mg Oral Daily  . atorvastatin  40 mg Oral Daily  . clopidogrel  75 mg Oral Daily  . famotidine  20 mg Oral BID  . heparin subcutaneous  5,000 Units Subcutaneous Q8H  . lisinopril  5 mg Oral QHS  . polyethylene glycol  17 g Oral Daily  . senna  1 tablet Oral Daily   Continuous Infusions:   PRN Meds: acetaminophen, chlordiazePOXIDE, hydrALAZINE, morphine injection, nitroGLYCERIN, ondansetron (ZOFRAN) IV, pantoprazole, zolpidem   Vital Signs    Vitals:   06/12/16 2031 06/12/16 2106 06/13/16 0101 06/13/16 0409  BP: 106/60 109/64 (!) 117/97 123/77  Pulse: 69  63 64  Resp: 16     Temp: 97.6 F (36.4 C)  98.2 F (36.8 C) 97.7 F (36.5 C)  TempSrc: Oral  Oral Oral  SpO2: 98%  98% 100%  Weight:    179 lb 6.4 oz (81.4 kg)  Height:        Intake/Output Summary (Last 24 hours) at 06/13/16 0911 Last data filed at 06/12/16 1219  Gross per 24 hour  Intake                0 ml  Output              700 ml  Net             -700 ml   Filed Weights   06/11/16 0419 06/12/16 0430 06/13/16 0409  Weight: 179 lb 6.4 oz (81.4 kg) 180 lb 8.9 oz (81.9 kg) 179 lb 6.4 oz (81.4 kg)    Physical Exam    GEN: Well nourished, well developed, in no acute distress.  HEENT: Grossly normal.  Neck: Supple, no JVD. Cardiac: RRR, no murmurs, rubs, or gallops. No clubbing, cyanosis, edema.     Respiratory:  Respirations regular and unlabored, clear to auscultation bilaterally. GI: Soft, nontender, nondistended, BS + x 4. MS: no deformity or atrophy. Skin: warm and dry, no rash. Neuro:  Strength and sensation are intact. Psych: AAOx3.  Normal affect.  Labs    CBC  Recent Labs  06/12/16 0503 06/13/16 0345  WBC 8.1 7.2  HGB 12.1* 12.1*  HCT 38.2* 37.4*  MCV 93.2 92.6  PLT 159 185   Basic Metabolic Panel  Recent Labs  06/10/16 1450 06/11/16 0117 06/12/16 0503  NA 141  --  140  K 4.7  --  3.9  CL 105  --  108  CO2 29  --  25  GLUCOSE 97  --  94  BUN 20  --  13  CREATININE 1.01 1.01 0.96  CALCIUM 10.5*  --  9.0  MG  --   --  2.0  PHOS  --   --  3.9   Liver Function Tests  Recent Labs  06/12/16 0503  AST 15  ALT 14*  ALKPHOS 50  BILITOT 0.5  PROT 5.4*  ALBUMIN 3.4*   No results for input(s): LIPASE, AMYLASE in the last 72 hours. Cardiac Enzymes  Recent Labs  06/11/16 0117 06/11/16 0601 06/11/16 1135  TROPONINI <0.03 <0.03 <0.03   BNP Invalid input(s): POCBNP D-Dimer  Recent Labs  06/10/16 1450  DDIMER 0.29   Hemoglobin A1C No results for input(s): HGBA1C in the last 72 hours. Fasting Lipid Panel No results for input(s): CHOL, HDL, LDLCALC, TRIG, CHOLHDL, LDLDIRECT in the last 72 hours. Thyroid Function Tests No results for input(s): TSH, T4TOTAL, T3FREE, THYROIDAB in the last 72 hours.  Invalid input(s): FREET3  Telemetry     - Personally Reviewed  ECG     - Personally Reviewed  Radiology    No results found.  Cardiac Studies     Patient Profile     See consult note  Assessment & Plan    1.  Chest pain - recurrent, started with palpitations during exertion - improved w/ SL NTG - Troponins were negative, ECG w/ no ST elevation, minor T wave changes - Plan for coronary angiography 10/16 - had early graft closure with SVG-Diag 100% 03/2016  2.  PAD/claudication - feel this is significant - Dr [name]  willing to do cath w/ PV runoff on Monday - ABI's were normal.   3. Essential hypertension    -BP controlled   Signed, Kate Sable, MD  06/13/2016, 9:11 AM

## 2016-06-14 ENCOUNTER — Encounter (HOSPITAL_COMMUNITY)
Admission: EM | Disposition: A | Discharge status: Home / Self Care | Payer: Self-pay | Source: Home / Self Care | Attending: Internal Medicine

## 2016-06-14 DIAGNOSIS — I25110 Atherosclerotic heart disease of native coronary artery with unstable angina pectoris: Principal | ICD-10-CM

## 2016-06-14 HISTORY — PX: CARDIAC CATHETERIZATION: SHX172

## 2016-06-14 HISTORY — PX: PERIPHERAL VASCULAR CATHETERIZATION: SHX172C

## 2016-06-14 LAB — PROTIME-INR
INR: 1.01
Prothrombin Time: 13.3 seconds (ref 11.4–15.2)

## 2016-06-14 LAB — CBC
HCT: 38.1 % — ABNORMAL LOW (ref 39.0–52.0)
Hemoglobin: 12.3 g/dL — ABNORMAL LOW (ref 13.0–17.0)
MCH: 29.7 pg (ref 26.0–34.0)
MCHC: 32.3 g/dL (ref 30.0–36.0)
MCV: 92 fL (ref 78.0–100.0)
Platelets: 162 10*3/uL (ref 150–400)
RBC: 4.14 MIL/uL — ABNORMAL LOW (ref 4.22–5.81)
RDW: 13.4 % (ref 11.5–15.5)
WBC: 7.6 10*3/uL (ref 4.0–10.5)

## 2016-06-14 LAB — COMPREHENSIVE METABOLIC PANEL
ALT: 14 U/L — ABNORMAL LOW (ref 17–63)
AST: 16 U/L (ref 15–41)
Albumin: 4 g/dL (ref 3.5–5.0)
Alkaline Phosphatase: 58 U/L (ref 38–126)
Anion gap: 6 (ref 5–15)
BUN: 13 mg/dL (ref 6–20)
CO2: 29 mmol/L (ref 22–32)
Calcium: 9.7 mg/dL (ref 8.9–10.3)
Chloride: 107 mmol/L (ref 101–111)
Creatinine, Ser: 1.12 mg/dL (ref 0.61–1.24)
GFR calc Af Amer: >60 mL/min (ref >60)
GFR calc non Af Amer: >60 mL/min (ref >60)
Glucose, Bld: 98 mg/dL (ref 65–99)
Potassium: 4.2 mmol/L (ref 3.5–5.1)
Sodium: 142 mmol/L (ref 135–145)
Total Bilirubin: 0.7 mg/dL (ref 0.3–1.2)
Total Protein: 6.7 g/dL (ref 6.5–8.1)

## 2016-06-14 LAB — PHOSPHORUS: Phosphorus: 2.7 mg/dL (ref 2.5–4.6)

## 2016-06-14 LAB — MAGNESIUM: Magnesium: 2.2 mg/dL (ref 1.7–2.4)

## 2016-06-14 SURGERY — LOWER EXTREMITY ANGIOGRAPHY

## 2016-06-14 MED ORDER — CLOPIDOGREL BISULFATE 75 MG PO TABS: 75.0000 mg | ORAL_TABLET | Freq: Every day | ORAL | Status: DC

## 2016-06-14 MED ORDER — HEPARIN (PORCINE) IN NACL 2-0.9 UNIT/ML-% IJ SOLN
INTRAMUSCULAR | Status: AC
  Filled 2016-06-14: qty 1000

## 2016-06-14 MED ORDER — HEPARIN (PORCINE) IN NACL 2-0.9 UNIT/ML-% IJ SOLN
INTRAMUSCULAR | Status: DC | PRN
  Administered 2016-06-14: 1000 mL

## 2016-06-14 MED ORDER — MIDAZOLAM HCL 2 MG/2ML IJ SOLN
INTRAMUSCULAR | Status: AC
  Filled 2016-06-14: qty 2

## 2016-06-14 MED ORDER — SODIUM CHLORIDE 0.9 % WEIGHT BASED INFUSION
3.0000 mL/kg/h | INTRAVENOUS | Status: DC
  Administered 2016-06-14: 3 mL/kg/h via INTRAVENOUS

## 2016-06-14 MED ORDER — SODIUM CHLORIDE 0.9% FLUSH: 3.0000 mL | INTRAVENOUS | Status: DC | PRN

## 2016-06-14 MED ORDER — FENTANYL CITRATE (PF) 100 MCG/2ML IJ SOLN
INTRAMUSCULAR | Status: DC | PRN
  Administered 2016-06-14: 25 ug via INTRAVENOUS

## 2016-06-14 MED ORDER — SODIUM CHLORIDE 0.9 % WEIGHT BASED INFUSION: 1.0000 mL/kg/h | INTRAVENOUS | Status: AC

## 2016-06-14 MED ORDER — IODIXANOL 320 MG/ML IV SOLN
INTRAVENOUS | Status: DC | PRN
  Administered 2016-06-14: 175 mL via INTRA_ARTERIAL

## 2016-06-14 MED ORDER — MIDAZOLAM HCL 2 MG/2ML IJ SOLN
INTRAMUSCULAR | Status: DC | PRN
  Administered 2016-06-14: 1 mg via INTRAVENOUS

## 2016-06-14 MED ORDER — SODIUM CHLORIDE 0.9 % WEIGHT BASED INFUSION: 1.0000 mL/kg/h | INTRAVENOUS | Status: DC

## 2016-06-14 MED ORDER — SODIUM CHLORIDE 0.9% FLUSH
3.0000 mL | Freq: Two times a day (BID) | INTRAVENOUS | Status: DC
  Administered 2016-06-14: 3 mL via INTRAVENOUS

## 2016-06-14 MED ORDER — ASPIRIN 81 MG PO CHEW: 81.0000 mg | CHEWABLE_TABLET | Freq: Every day | ORAL | Status: DC

## 2016-06-14 MED ORDER — MORPHINE SULFATE (PF) 2 MG/ML IV SOLN: 2.0000 mg | INTRAVENOUS | Status: DC | PRN

## 2016-06-14 MED ORDER — ONDANSETRON HCL 4 MG/2ML IJ SOLN: 4.0000 mg | Freq: Four times a day (QID) | INTRAMUSCULAR | Status: DC | PRN

## 2016-06-14 MED ORDER — LIDOCAINE HCL (PF) 1 % IJ SOLN
INTRAMUSCULAR | Status: DC | PRN
  Administered 2016-06-14: 20 mL

## 2016-06-14 MED ORDER — SODIUM CHLORIDE 0.9% FLUSH
3.0000 mL | Freq: Two times a day (BID) | INTRAVENOUS | Status: DC
  Administered 2016-06-15: 3 mL via INTRAVENOUS

## 2016-06-14 MED ORDER — FENTANYL CITRATE (PF) 100 MCG/2ML IJ SOLN
INTRAMUSCULAR | Status: AC
  Filled 2016-06-14: qty 2

## 2016-06-14 MED ORDER — ACETAMINOPHEN 325 MG PO TABS: 650.0000 mg | ORAL_TABLET | ORAL | Status: DC | PRN

## 2016-06-14 MED ORDER — SODIUM CHLORIDE 0.9 % IV SOLN: 250.0000 mL | INTRAVENOUS | Status: DC | PRN

## 2016-06-14 MED ORDER — LIDOCAINE HCL (PF) 1 % IJ SOLN
INTRAMUSCULAR | Status: AC
  Filled 2016-06-14: qty 30

## 2016-06-14 MED ORDER — ASPIRIN 81 MG PO CHEW
81.0000 mg | CHEWABLE_TABLET | ORAL | Status: AC
  Administered 2016-06-14: 81 mg via ORAL
  Filled 2016-06-14: qty 1

## 2016-06-14 MED ORDER — HYDRALAZINE HCL 20 MG/ML IJ SOLN: 10.0000 mg | INTRAMUSCULAR | Status: DC | PRN

## 2016-06-14 SURGICAL SUPPLY — 8 items
CATH INFINITI 5 FR RCB (CATHETERS) ×2 IMPLANT
CATH INFINITI 5FR MULTPACK ANG (CATHETERS) ×2 IMPLANT
KIT PV (KITS) ×2 IMPLANT
SHEATH PINNACLE 5F 10CM (SHEATH) ×2 IMPLANT
SYRINGE MEDRAD AVANTA MACH 7 (SYRINGE) ×2 IMPLANT
TRANSDUCER W/STOPCOCK (MISCELLANEOUS) ×2 IMPLANT
TRAY PV CATH (CUSTOM PROCEDURE TRAY) ×2 IMPLANT
WIRE HITORQ VERSACORE ST 145CM (WIRE) ×2 IMPLANT

## 2016-06-14 NOTE — Interval H&P Note (Signed)
Cath Lab Visit (complete for each Cath Lab visit)  Clinical Evaluation Leading to the Procedure:   ACS: Yes.    Non-ACS:    Anginal Classification: CCS IV  Anti-ischemic medical therapy: Minimal Therapy (1 class of medications)  Non-Invasive Test Results: No non-invasive testing performed  Prior CABG: Previous CABG      History and Physical Interval Note:  06/14/2016 1:38 PM  [name]  has presented today for surgery, with the diagnosis of cp - claudication  The various methods of treatment have been discussed with the patient and family. After consideration of risks, benefits and other options for treatment, the patient has consented to  Procedure(s): Lower Extremity Angiography (N/A) Left Heart Cath and Cors/Grafts Angiography (N/A) as a surgical intervention .  The patient's history has been reviewed, patient examined, no change in status, stable for surgery.  I have reviewed the patient's chart and labs.  Questions were answered to the patient's satisfaction.     Quay Burow

## 2016-06-14 NOTE — H&P (View-Only) (Signed)
Patient Name: [name] Date of Encounter: 06/14/2016  Primary Cardiologist: Dr. Marinda Elk Problem List     Principal Problem:   Chest pain Active Problems:   Hepatitis C   Ischemic cardiomyopathy   Essential hypertension   Claudication of both lower extremities (HCC)   Pure hypercholesterolemia   Tobacco abuse disorder   Coronary artery disease involving coronary bypass graft of native heart with unstable angina pectoris [facility])   Patient Profile     [name] is a 60 y.o. year old male with a history of CAD (STEMI 10/2015 s/p POBA of LAD with IABP and subsequent emergent CABG), LIMA-LAD, SVG-Diag, SVG-RI-OM, SVG-PDA. HCV s/p treatment, prior tobacco abuse, GERD, anxiety, chronic systolic CHF (most recent EF 45-50%), tobacco abuse (now quit). He was readmitted 04/21/16 with chest pain>>abnormal stress test>>cath showed occlusion of the SVG-Diag, other grafts patent, EF 50-55%, no PCI. D1 90%.  Now readmitted 06/10/16 with recurrent CP. Troponins were negative, ECG w/ no ST elevation, minor T wave changes.  Plan for coronary angiography 10/16   Subjective   Currently CP free. He is a bit concerned about cath.   Inpatient Medications    Scheduled Meds: . aspirin EC  81 mg Oral Daily  . atorvastatin  40 mg Oral Daily  . clopidogrel  75 mg Oral Daily  . famotidine  20 mg Oral BID  . heparin subcutaneous  5,000 Units Subcutaneous Q8H  . lisinopril  5 mg Oral QHS  . polyethylene glycol  17 g Oral Daily  . senna  1 tablet Oral Daily  . sodium chloride flush  3 mL Intravenous Q12H   Continuous Infusions: . sodium chloride     PRN Meds: sodium chloride, acetaminophen, chlordiazePOXIDE, hydrALAZINE, morphine injection, nitroGLYCERIN, ondansetron (ZOFRAN) IV, pantoprazole, sodium chloride flush, zolpidem   Vital Signs    Vitals:   06/13/16 0409 06/13/16 1331 06/13/16 2056 06/14/16 0554  BP: 123/77 111/68 118/61 126/71  Pulse: 64 68 68 (!) 59  Resp:  17  16   Temp: 97.7 F (36.5 C) 98.4 F (36.9 C) 98.3 F (36.8 C) 98.1 F (36.7 C)  TempSrc: Oral Oral Oral Oral  SpO2: 100% 100% 98% 100%  Weight: 179 lb 6.4 oz (81.4 kg)   178 lb 14.4 oz (81.1 kg)  Height:        Intake/Output Summary (Last 24 hours) at 06/14/16 0806 Last data filed at 06/13/16 1847  Gross per 24 hour  Intake              720 ml  Output                0 ml  Net              720 ml   Filed Weights   06/12/16 0430 06/13/16 0409 06/14/16 0554  Weight: 180 lb 8.9 oz (81.9 kg) 179 lb 6.4 oz (81.4 kg) 178 lb 14.4 oz (81.1 kg)    Physical Exam    GEN: Well nourished, well developed, in no acute distress.  HEENT: Grossly normal.  Neck: Supple, no JVD, carotid bruits, or masses. Cardiac: RRR, no murmurs, rubs, or gallops. No clubbing, cyanosis, edema.  Radials/DP/PT 2+ and equal bilaterally.  Respiratory:  Respirations regular and unlabored, clear to auscultation bilaterally. GI: Soft, nontender, nondistended, BS + x 4. MS: no deformity or atrophy. Skin: warm and dry, no rash. Neuro:  Strength and sensation are intact. Psych: AAOx3.  Normal affect.  Labs  CBC  Recent Labs  06/13/16 0345 06/14/16 0358  WBC 7.2 7.6  HGB 12.1* 12.3*  HCT 37.4* 38.1*  MCV 92.6 92.0  PLT 168 102   Basic Metabolic Panel  Recent Labs  06/12/16 0503  NA 140  K 3.9  CL 108  CO2 25  GLUCOSE 94  BUN 13  CREATININE 0.96  CALCIUM 9.0  MG 2.0  PHOS 3.9   Liver Function Tests  Recent Labs  06/12/16 0503  AST 15  ALT 14*  ALKPHOS 50  BILITOT 0.5  PROT 5.4*  ALBUMIN 3.4*   No results for input(s): LIPASE, AMYLASE in the last 72 hours. Cardiac Enzymes  Recent Labs  06/11/16 1135  TROPONINI <0.03   BNP Invalid input(s): POCBNP D-Dimer No results for input(s): DDIMER in the last 72 hours. Hemoglobin A1C No results for input(s): HGBA1C in the last 72 hours. Fasting Lipid Panel No results for input(s): CHOL, HDL, LDLCALC, TRIG, CHOLHDL, LDLDIRECT in the  last 72 hours. Thyroid Function Tests No results for input(s): TSH, T4TOTAL, T3FREE, THYROIDAB in the last 72 hours.  Invalid input(s): FREET3  Telemetry    NSR - Personally Reviewed  ECG    06/13/16 NSR, low voltage - Personally Reviewed  Radiology    No results found.  Cardiac Studies   LHC/ PV angiogram - results pending  Patient Profile     [name] is a 60 y.o. year old male with a history of CAD (STEMI 10/2015 s/p POBA of LAD with IABP and subsequent emergent CABG), LIMA-LAD, SVG-Diag, SVG-RI-OM, SVG-PDA. HCV s/p treatment, prior tobacco abuse, GERD, anxiety, chronic systolic CHF (most recent EF 45-50%), tobacco abuse (now quit). He was readmitted 04/21/16 with chest pain>>abnormal stress test>>cath showed occlusion of the SVG-Diag, other grafts patent, EF 50-55%, no PCI. D1 90%.  Now readmitted 06/10/16 with recurrent CP. Troponins were negative, ECG w/ no ST elevation, minor T wave changes.  Also with PVD/ claudication. Plan for coronary angiography with PV runoff 10/16  Assessment & Plan    1.  Chest pain - recurrent, started with palpitations during exertion - improved w/ SL NTG - Currently CP free - ez neg so far, ECG w/ no ST elevation, minor T wave changes - had early graft closure with SVG-Diag 100% 03/2016 - Plan for [facility] +/- PCI today per Dr. [name] - continue medical therapy: IV heparin until cath, ASA, Plavix, Lipitor and lisinopril. Not on BB at present. Borderline bradycardia at baseline.   2.  PAD/claudication - feel this is significant - Dr [name] willing to do cath w/ PV runoff - Continue ASA and Plavix   3. Ischemic cardiomyopathy -  EF 45-50% on last echo 12/2015 (previously 25-30% at time of MI 10/2015) - continue ACE-I therapy with lisinopril - not on BB due to bradycardia      4.  Essential hypertension -  BP is well controlled with lisinopril  5. Hepatitis C -  Per IM   Signed, Lyda Jester, PA-C  06/14/2016, 8:06 AM   I  have examined the patient and reviewed assessment and plan and discussed with patient.  Agree with above as stated.  Plan for cath today.  Had discoloration of both feet.  Improved but not gone completely.  Plan for LE runoff as well.  He has had frequent recurrent CP post CABG.  He is anxious at baseline per his own report.  Hopefully, cath results will be unchanged and this will help with the anxiety.  Larae Grooms

## 2016-06-14 NOTE — Progress Notes (Signed)
Site area: Ft fem art Site Prior to Removal:  Level O Pressure Applied For:25 min Manual:  Yes Patient Status During Pull:  A/O Post Pull Site:  Level O Post Pull Instructions Given:  Yes. Instructions given and pt understands Post Pull Pulses Present: 2+ rt dp/pt Dressing Applied:  4x4 and tegaderm Bedrest begins @ 15:00:00 Comments: Pt leaves cath lab holding in stable condition. Rt groin is unremarkable. Rt groin dressing is CDI.

## 2016-06-14 NOTE — Progress Notes (Signed)
Pt c/o midsternal CP 6/10.  EKG shows no significant changes.  IV morphine given per PRN order.  Will continue to monitor.  [name]

## 2016-06-14 NOTE — Progress Notes (Signed)
PROGRESS NOTE    [name]  UPJ:[id number] DOB: 1956/06/13 DOA: 06/10/2016 PCP: [name], MD   Brief Narrative: [name] is a 60 y.o. male with a history of CAD s/p CABG in march of this year as well as Hepatitis C, GERD, Anxiety and other comorbids who presented with chest pain. Patient started experiencing chest pain at around 11 AM at home while exercising associated with shortness of breath dizziness nausea and diaphoresis. Patient said his blood pressure was around 929 systolic. Denies abdominal pain. Symptoms improved with rest. Later in the evening when patient was diriving far to run errands patients symptoms recurred so he decided to come to the ER for evaluation. On presentation to the ER D-dimer, cardiac droppers, and EKG were unremarkable. Chest pain improved with NTG and morphine. Patient admitted for further management of chest pain. He was evaluated by Cardiology and they were concerned about PAD and Claudication and patient is scheduled to undergo coronary angiography given his early graft closure in August along with Lower Extremity Angiography and PV runoff today by Dr. [name].  Assessment & Plan:   Principal Problem:   Chest pain Active Problems:   Hepatitis C   Ischemic cardiomyopathy   Essential hypertension   Claudication of both lower extremities (HCC)   Pure hypercholesterolemia   Tobacco abuse disorder   Coronary artery disease involving coronary bypass graft of native heart with unstable angina pectoris (HCC)  Chest pain s/p CABG -  -Patient states it still hurts intermittently -Cardiology Consulted and appreciated Recc's -Cardiology D/C'd Anticoagulation with Heparin gtt;  -Troponin <0.03 x 3 -Repeat cardiac cath was done in August of 2017 and had Early Graft Closure per Cardic Notes -C/w Morphine and SL NTG for pain.  -Continue aspirin '81mg'$ , plavix 75 mg, and Atorvastain 40 mg and Lisinoprol.  -Cardiac Catheterization today- Showed:   Ost LM  lesion, 95 %stenosed.  Ost Cx lesion, 95 %stenosed.  Ost LAD to Prox LAD lesion, 75 %stenosed.  Ost Ramus lesion, 90 %stenosed.  Ramus lesion, 75 %stenosed.  Ost RCA to Prox RCA lesion, 50 %stenosed.  RPDA lesion, 40 %stenosed.  Origin to Mid Graft lesion, 60 %stenosed.  Origin to Mid Graft lesion, 50 %stenosed.  Origin to Prox Graft lesion, 60 %stenosed [name] has unchanged anatomy compared to his Performed in August. He has an occluded diagonal vein graft. The diagonal fills by left to left collaterals. He has moderate disease in his RCA vein graft and sequential ramus/obtuse marginal branch vein graft which have shown no progression over the last 2-3 months. -Post Cath Protocol in Place with Hemostasis. Will likely D/C in AM after evaluation of Cath site. -Await Further Recc's per Cards Dr. Irish Lack.   Suspected PAD/Claudication -Cardiology Following. -Ordered ABI's- ABIs and Doppler waveforms are normal bilaterally at rest. -Cath Today showed his peripheral angiogram shows no obstructive disease in large vessels -No longer on Anticoagulation with Heparin because D/C'd by Cardiology.   HTN -  -Continue Lisinopril 5 mg home dose and hydralazine as needed.  -Monitor blood pressures closely.   Constipation -Likely 2/2 to Opiates -Gave Patient Senna and Miralax; Stated improvement of Symptoms  Hepatitis C -Stable. Hx of Tx.   GERD -C/w Famotidine and with Pantoprazole 40 mg po Daily prn  DVT prophylaxis:  Heparin sq 5,000 Code Status: Full Family Communication: Family at bedside and discussed plan of Cre.  Disposition Plan: Home at D/C  Consultants:  -Cardiology  Procedures:  Coronary and Lower Extremity Angiography tomorrow Antimicrobials:  None  Subjective: Seen and examined this AM and was waiting for cathetherization. Had no CP/SOB/ N or Vomiting overnight. Arrived back from Cath Late Afternoon with Pressure Dressing. Will re-evaluate Cath Site in Am.  Patient had questions for Cardiology and they will be addressed then.    Objective: Vitals:   06/14/16 1505 06/14/16 1525 06/14/16 1555 06/14/16 1610  BP: 129/74 132/80 (!) 158/57 132/78  Pulse: 62 69 74 70  Resp: 14 (!) 21 16   Temp:      TempSrc:      SpO2: 100% 100% 100% 99%  Weight:      Height:       No intake or output data in the 24 hours ending 06/14/16 1903 Filed Weights   06/12/16 0430 06/13/16 0409 06/14/16 0554  Weight: 81.9 kg (180 lb 8.9 oz) 81.4 kg (179 lb 6.4 oz) 81.1 kg (178 lb 14.4 oz)   Examination: Physical Exam:  Constitutional: WN/WD, NAD  Eyes:  Lids and conjunctivae normal, sclerae anicteric  ENMT: External Ears, Nose appear normal. Grossly normal hearing.  Neck: Appears normal, supple, no cervical masses, normal ROM, no appreciable thyromegaly, no JVD Respiratory: Clear to auscultation bilaterally, no wheezing, rales, rhonchi or crackles. Normal respiratory effort and patient is not tachypenic. No accessory muscle use.  Cardiovascular: RRR, no murmurs / rubs / gallops. S1 and S2 auscultated. No extremity edema. Diminished pedal pulses.  Abdomen: Soft, non-tender, non-distended. No masses palpated. No appreciable hepatosplenomegaly. Bowel sounds positive.  GU: Deferred. Musculoskeletal: Warmer LE today. No joint deformity upper and lower extremities. Good ROM, no contractures. Normal strength and muscle tone.  Skin: No rashes, lesions, ulcers. No induration; Warm and dry.  Neurologic: CN 2-12 grossly intact with no focal deficits. Sensation intact in all 4 Extremities. Strength 5/5 in all 4. Romberg sign cerebellar reflexes not assessed.  Psychiatric: Normal judgment and insight. Alert and oriented x 3. Normal mood and appropriate affect.   Data Reviewed: I have personally reviewed following labs and imaging studies  CBC:  Recent Labs Lab 06/10/16 1450 06/11/16 0117 06/12/16 0503 06/13/16 0345 06/14/16 0358  WBC 8.8 9.0 8.1 7.2 7.6  HGB 14.4  12.4* 12.1* 12.1* 12.3*  HCT 42.7 37.8* 38.2* 37.4* 38.1*  MCV 91.2 91.5 93.2 92.6 92.0  PLT 213 172 159 168 952   Basic Metabolic Panel:  Recent Labs Lab 06/10/16 1450 06/11/16 0117 06/12/16 0503 06/14/16 0844  NA 141  --  140 142  K 4.7  --  3.9 4.2  CL 105  --  108 107  CO2 29  --  25 29  GLUCOSE 97  --  94 98  BUN 20  --  13 13  CREATININE 1.01 1.01 0.96 1.12  CALCIUM 10.5*  --  9.0 9.7  MG  --   --  2.0 2.2  PHOS  --   --  3.9 2.7   GFR: Estimated Creatinine Clearance: 69.3 mL/min (by C-G formula based on SCr of 1.12 mg/dL). Liver Function Tests:  Recent Labs Lab 06/12/16 0503 06/14/16 0844  AST 15 16  ALT 14* 14*  ALKPHOS 50 58  BILITOT 0.5 0.7  PROT 5.4* 6.7  ALBUMIN 3.4* 4.0   No results for input(s): LIPASE, AMYLASE in the last 168 hours. No results for input(s): AMMONIA in the last 168 hours. Coagulation Profile:  Recent Labs Lab 06/14/16 0510  INR 1.01   Cardiac Enzymes:  Recent Labs Lab 06/10/16 1450 06/11/16 0117 06/11/16 0601 06/11/16 1135  TROPONINI <  0.03 <0.03 <0.03 <0.03   BNP (last 3 results) No results for input(s): PROBNP in the last 8760 hours. HbA1C: No results for input(s): HGBA1C in the last 72 hours. CBG: No results for input(s): GLUCAP in the last 168 hours. Lipid Profile: No results for input(s): CHOL, HDL, LDLCALC, TRIG, CHOLHDL, LDLDIRECT in the last 72 hours. Thyroid Function Tests: No results for input(s): TSH, T4TOTAL, FREET4, T3FREE, THYROIDAB in the last 72 hours. Anemia Panel: No results for input(s): VITAMINB12, FOLATE, FERRITIN, TIBC, IRON, RETICCTPCT in the last 72 hours. Sepsis Labs: No results for input(s): PROCALCITON, LATICACIDVEN in the last 168 hours.  Recent Results (from the past 240 hour(s))  MRSA PCR Screening     Status: None   Collection Time: 06/11/16 12:07 AM  Result Value Ref Range Status   MRSA by PCR NEGATIVE NEGATIVE Final    Comment:        The GeneXpert MRSA Assay (FDA approved  for NASAL specimens only), is one component of a comprehensive MRSA colonization surveillance program. It is not intended to diagnose MRSA infection nor to guide or monitor treatment for MRSA infections.     Radiology Studies: No results found. Scheduled Meds: . aspirin EC  81 mg Oral Daily  . atorvastatin  40 mg Oral Daily  . clopidogrel  75 mg Oral Daily  . famotidine  20 mg Oral BID  . heparin subcutaneous  5,000 Units Subcutaneous Q8H  . lisinopril  5 mg Oral QHS  . polyethylene glycol  17 g Oral Daily  . senna  1 tablet Oral Daily  . sodium chloride flush  3 mL Intravenous Q12H   Continuous Infusions:    LOS: 2 days   Kerney Elbe, DO [facility] Pager [phone]  If 7PM-7AM, please contact night-coverage [email or web address] Password [email or web address] 06/14/2016, 7:03 PM

## 2016-06-14 NOTE — Progress Notes (Signed)
Site area: RFA Site Prior to Removal:  Level 0 Pressure Applied For:11mn Manual:  yes  Patient Status During Pull:  stable Post Pull Site:  Level 0 Post Pull Instructions Given: yes  Post Pull Pulses Present: palpable Dressing Applied:  tegaderm Bedrest begins @ 1450 till 1850 Comments:removed by [employer]

## 2016-06-14 NOTE — Plan of Care (Signed)
Problem: Phase II Progression Outcomes Goal: Anginal pain relieved Outcome: Progressing Patient has multiple questions concerning treatment/exercise plan after discharge.  PA [place] notified of patient's questions and concerns.  Primary Dr. Alfredia Ferguson spoke with patient and held discharge until am after Cardiology sees.  Sanda Linger

## 2016-06-14 NOTE — Progress Notes (Signed)
Patient Name: [name] Date of Encounter: 06/14/2016  Primary Cardiologist: Dr. Marinda Elk Problem List     Principal Problem:   Chest pain Active Problems:   Hepatitis C   Ischemic cardiomyopathy   Essential hypertension   Claudication of both lower extremities (HCC)   Pure hypercholesterolemia   Tobacco abuse disorder   Coronary artery disease involving coronary bypass graft of native heart with unstable angina pectoris [facility])   Patient Profile     [name] is a 60 y.o. year old male with a history of CAD (STEMI 10/2015 s/p POBA of LAD with IABP and subsequent emergent CABG), LIMA-LAD, SVG-Diag, SVG-RI-OM, SVG-PDA. HCV s/p treatment, prior tobacco abuse, GERD, anxiety, chronic systolic CHF (most recent EF 45-50%), tobacco abuse (now quit). He was readmitted 04/21/16 with chest pain>>abnormal stress test>>cath showed occlusion of the SVG-Diag, other grafts patent, EF 50-55%, no PCI. D1 90%.  Now readmitted 06/10/16 with recurrent CP. Troponins were negative, ECG w/ no ST elevation, minor T wave changes.  Plan for coronary angiography 10/16   Subjective   Currently CP free. He is a bit concerned about cath.   Inpatient Medications    Scheduled Meds: . aspirin EC  81 mg Oral Daily  . atorvastatin  40 mg Oral Daily  . clopidogrel  75 mg Oral Daily  . famotidine  20 mg Oral BID  . heparin subcutaneous  5,000 Units Subcutaneous Q8H  . lisinopril  5 mg Oral QHS  . polyethylene glycol  17 g Oral Daily  . senna  1 tablet Oral Daily  . sodium chloride flush  3 mL Intravenous Q12H   Continuous Infusions: . sodium chloride     PRN Meds: sodium chloride, acetaminophen, chlordiazePOXIDE, hydrALAZINE, morphine injection, nitroGLYCERIN, ondansetron (ZOFRAN) IV, pantoprazole, sodium chloride flush, zolpidem   Vital Signs    Vitals:   06/13/16 0409 06/13/16 1331 06/13/16 2056 06/14/16 0554  BP: 123/77 111/68 118/61 126/71  Pulse: 64 68 68 (!) 59  Resp:  17  16   Temp: 97.7 F (36.5 C) 98.4 F (36.9 C) 98.3 F (36.8 C) 98.1 F (36.7 C)  TempSrc: Oral Oral Oral Oral  SpO2: 100% 100% 98% 100%  Weight: 179 lb 6.4 oz (81.4 kg)   178 lb 14.4 oz (81.1 kg)  Height:        Intake/Output Summary (Last 24 hours) at 06/14/16 0806 Last data filed at 06/13/16 1847  Gross per 24 hour  Intake              720 ml  Output                0 ml  Net              720 ml   Filed Weights   06/12/16 0430 06/13/16 0409 06/14/16 0554  Weight: 180 lb 8.9 oz (81.9 kg) 179 lb 6.4 oz (81.4 kg) 178 lb 14.4 oz (81.1 kg)    Physical Exam    GEN: Well nourished, well developed, in no acute distress.  HEENT: Grossly normal.  Neck: Supple, no JVD, carotid bruits, or masses. Cardiac: RRR, no murmurs, rubs, or gallops. No clubbing, cyanosis, edema.  Radials/DP/PT 2+ and equal bilaterally.  Respiratory:  Respirations regular and unlabored, clear to auscultation bilaterally. GI: Soft, nontender, nondistended, BS + x 4. MS: no deformity or atrophy. Skin: warm and dry, no rash. Neuro:  Strength and sensation are intact. Psych: AAOx3.  Normal affect.  Labs  CBC  Recent Labs  06/13/16 0345 06/14/16 0358  WBC 7.2 7.6  HGB 12.1* 12.3*  HCT 37.4* 38.1*  MCV 92.6 92.0  PLT 168 161   Basic Metabolic Panel  Recent Labs  06/12/16 0503  NA 140  K 3.9  CL 108  CO2 25  GLUCOSE 94  BUN 13  CREATININE 0.96  CALCIUM 9.0  MG 2.0  PHOS 3.9   Liver Function Tests  Recent Labs  06/12/16 0503  AST 15  ALT 14*  ALKPHOS 50  BILITOT 0.5  PROT 5.4*  ALBUMIN 3.4*   No results for input(s): LIPASE, AMYLASE in the last 72 hours. Cardiac Enzymes  Recent Labs  06/11/16 1135  TROPONINI <0.03   BNP Invalid input(s): POCBNP D-Dimer No results for input(s): DDIMER in the last 72 hours. Hemoglobin A1C No results for input(s): HGBA1C in the last 72 hours. Fasting Lipid Panel No results for input(s): CHOL, HDL, LDLCALC, TRIG, CHOLHDL, LDLDIRECT in the  last 72 hours. Thyroid Function Tests No results for input(s): TSH, T4TOTAL, T3FREE, THYROIDAB in the last 72 hours.  Invalid input(s): FREET3  Telemetry    NSR - Personally Reviewed  ECG    06/13/16 NSR, low voltage - Personally Reviewed  Radiology    No results found.  Cardiac Studies   LHC/ PV angiogram - results pending  Patient Profile     [name] is a 60 y.o. year old male with a history of CAD (STEMI 10/2015 s/p POBA of LAD with IABP and subsequent emergent CABG), LIMA-LAD, SVG-Diag, SVG-RI-OM, SVG-PDA. HCV s/p treatment, prior tobacco abuse, GERD, anxiety, chronic systolic CHF (most recent EF 45-50%), tobacco abuse (now quit). He was readmitted 04/21/16 with chest pain>>abnormal stress test>>cath showed occlusion of the SVG-Diag, other grafts patent, EF 50-55%, no PCI. D1 90%.  Now readmitted 06/10/16 with recurrent CP. Troponins were negative, ECG w/ no ST elevation, minor T wave changes.  Also with PVD/ claudication. Plan for coronary angiography with PV runoff 10/16  Assessment & Plan    1.  Chest pain - recurrent, started with palpitations during exertion - improved w/ SL NTG - Currently CP free - ez neg so far, ECG w/ no ST elevation, minor T wave changes - had early graft closure with SVG-Diag 100% 03/2016 - Plan for [facility] +/- PCI today per Dr. [name] - continue medical therapy: IV heparin until cath, ASA, Plavix, Lipitor and lisinopril. Not on BB at present. Borderline bradycardia at baseline.   2.  PAD/claudication - feel this is significant - Dr [name] willing to do cath w/ PV runoff - Continue ASA and Plavix   3. Ischemic cardiomyopathy -  EF 45-50% on last echo 12/2015 (previously 25-30% at time of MI 10/2015) - continue ACE-I therapy with lisinopril - not on BB due to bradycardia      4.  Essential hypertension -  BP is well controlled with lisinopril  5. Hepatitis C -  Per IM   Signed, Lyda Jester, PA-C  06/14/2016, 8:06 AM   I  have examined the patient and reviewed assessment and plan and discussed with patient.  Agree with above as stated.  Plan for cath today.  Had discoloration of both feet.  Improved but not gone completely.  Plan for LE runoff as well.  He has had frequent recurrent CP post CABG.  He is anxious at baseline per his own report.  Hopefully, cath results will be unchanged and this will help with the anxiety.  Larae Grooms

## 2016-06-15 ENCOUNTER — Other Ambulatory Visit: Payer: BC Managed Care – PPO

## 2016-06-15 ENCOUNTER — Encounter (HOSPITAL_COMMUNITY): Payer: Self-pay | Admitting: Cardiovascular Disease

## 2016-06-15 DIAGNOSIS — I209 Angina pectoris, unspecified: Secondary | ICD-10-CM

## 2016-06-15 LAB — CBC
HCT: 38.3 % — ABNORMAL LOW (ref 39.0–52.0)
Hemoglobin: 12.5 g/dL — ABNORMAL LOW (ref 13.0–17.0)
MCH: 30 pg (ref 26.0–34.0)
MCHC: 32.6 g/dL (ref 30.0–36.0)
MCV: 91.8 fL (ref 78.0–100.0)
Platelets: 173 10*3/uL (ref 150–400)
RBC: 4.17 MIL/uL — ABNORMAL LOW (ref 4.22–5.81)
RDW: 13.5 % (ref 11.5–15.5)
WBC: 7.3 10*3/uL (ref 4.0–10.5)

## 2016-06-15 NOTE — Discharge Summary (Signed)
Physician Discharge Summary  [name] HAL:[id number] DOB: [birth year] DOA: 06/10/2016  PCP: [name], MD  Admit date: 06/10/2016 Discharge date: 06/15/2016  Admitted From: Home Disposition:  Home  Recommendations for Outpatient Follow-up:  1. Follow up with PCP in 1-2 weeks 2. Cardiology [facility] at next scheduled appointment with Dr. Irish Lack 3. Please obtain BMP/CBC in one week   Home Health: No Equipment/Devices: None  Discharge Condition: Stable CODE STATUS: FULL Diet recommendation: Heart Healthy   Brief/Interim Summary: Anthony Kubrickis a 60 y.o.malewith a historyof CAD s/p CABG in march of this year as well as Hepatitis C, GERD, Anxiety and other comorbids who presented with chest pain.Patient started experiencing chest pain at around 11 AM at home while exercising associated with shortness of breath dizziness nausea and diaphoresis. Patient said his blood pressurewas around 735 systolic.Denies abdominal pain. Symptoms improved withrest. Later in the evening when patientwas diriving far to run errands patients symptoms recurred so he decided to come to the ER for evaluation. On presentation to the ER D-dimer, cardiac droppers, and EKG were unremarkable. Chest pain improved with NTG andmorphine. Patient admitted for further management of chest pain. He was evaluated by Cardiology and they were concerned about PAD and Claudication and patient underwent coronary angiography given his early graft closure in August along with Lower Extremity Angiography and PV runoff yesterday by Dr. [name]. Cardiology came to see him today and answered all questions to patients Satisfaction and updated  Him about his Cath and patient felt less anxious about the moderate disease in his grafts. Patient was stable for D/C and will follow up with PCP and with His Primary Cardiologist as an outpatient.  Discharge Diagnoses:  Principal Problem:   Chest pain Active Problems:  Hepatitis C   Ischemic cardiomyopathy   Essential hypertension   Claudication of both lower extremities (HCC)   Pure hypercholesterolemia   Tobacco abuse disorder   Coronary artery disease involving coronary bypass graft of native heart with unstable angina pectoris [facility])  Chest pain s/p CABG -  -Cardiology Consulted and appreciated Recc's -Cardiology D/C'd Anticoagulation with Heparin gtt;  -Troponin <0.03 x 3 -Repeat cardiac cath was done in August of 2017 and had Early Graft Closure per Cardic Notes  Gave patient Morphine and SL NTG for pain.  -Cardiac Catheterization DONE 06/14/16- Showed:   Ost LM lesion, 95 %stenosed.  Ost Cx lesion, 95 %stenosed.  Ost LAD to Prox LAD lesion, 75 %stenosed.  Ost Ramus lesion, 90 %stenosed.  Ramus lesion, 75 %stenosed.  Ost RCA to Prox RCA lesion, 50 %stenosed.  RPDA lesion, 40 %stenosed.  Origin to Mid Graft lesion, 60 %stenosed.  Origin to Mid Graft lesion, 50 %stenosed.  Origin to Prox Graft lesion, 60 %stenosed Patient has unchanged anatomy compared to his Performed in August. He has an occluded diagonal vein graft. The diagonal fills by left to left collaterals. He has moderate disease in his RCA vein graft and sequential ramus/obtuse marginal branch vein graft which have shown no progression over the last 2-3 months. -Continue aspirin '81mg'$ , plavix 75 mg, andAtorvastain 40 mg and Lisinoprol.  -Cardiology thinks may possibly benefit from Imdur. Can be started as outpatient.  -Follow up with Cardiology as an Outpatient and maintain next scheduled appointment.  Suspected PAD/Claudication -Cardiology Followed. -ABIs and Doppler waveforms are normal bilaterally at rest. -Cath yesterday showed his peripheral angiogram shows no obstructive disease in large vessels -No longer on Anticoagulation with Heparin because D/C'd by Cardiology.  -Continue ASA and Plavix -Follow  up with Cardiology as an Outpatient  HTN -  -Continue  Lisinopril 5 mghome dose.  -Advised by Cardiology that if necessary if BP elevates, patient can take additional dose. -Monitor blood pressures closely.   Constipation -Likely 2/2 to Opiates -Gave Patient Senna and Miralax; Stated improvement of Symptoms  Hepatitis C -Stable. Hx of Tx.    GERD -C/w Famotidine and with Pantoprazole 40 mg po Daily prn  Discharge Instructions  Discharge Instructions    Call MD for:  difficulty breathing, headache or visual disturbances    Complete by:  As directed    Call MD for:  persistant dizziness or light-headedness    Complete by:  As directed    Call MD for:  persistant nausea and vomiting    Complete by:  As directed    Diet - low sodium heart healthy    Complete by:  As directed    Discharge instructions    Complete by:  As directed    Follow up with PCP and with Cardiology as an outpatient. Take all medications as prescribed. If symptoms worsen or change please return to ER for evaluation.   Increase activity slowly    Complete by:  As directed        Medication List    TAKE these medications   aspirin 81 MG tablet Take 81 mg by mouth daily.   atorvastatin 40 MG tablet Commonly known as:  LIPITOR Take 40 mg by mouth daily.   chlordiazePOXIDE 25 MG capsule Commonly known as:  LIBRIUM Take 25 mg by mouth daily as needed for anxiety.   clopidogrel 75 MG tablet Commonly known as:  PLAVIX Take 75 mg by mouth daily.   lisinopril 5 MG tablet Commonly known as:  PRINIVIL,ZESTRIL Take 5 mg by mouth daily.   nitroGLYCERIN 0.4 MG SL tablet Commonly known as:  NITROSTAT Place 0.4 mg under the tongue every 5 (five) minutes as needed for chest pain. 3 DOSE MAX   ONE-A-DAY MENS 50+ ADVANTAGE PO Take 1 tablet by mouth daily.   PROTONIX 40 MG tablet Generic drug:  pantoprazole Take 40 mg by mouth daily as needed (FOR REFLUX). FOR REFLUX   ranitidine 150 MG tablet Commonly known as:  ZANTAC Take 150 mg by mouth as needed for  heartburn.   zolpidem 10 MG tablet Commonly known as:  AMBIEN Take 10 mg by mouth at bedtime as needed for sleep. Reported on 02/09/2016      Follow-up Information    [name], MD .   Specialty:  Family Medicine Contact information: [address] [place] [address] [phone]        Larae Grooms, MD Follow up in 1 week(s).   Specialties:  Cardiology, Radiology, Interventional Cardiology Why:  Appointment with [place] information: [address] [place] [address] [phone]          Allergies  Allergen Reactions  . Prednisone Other (See Comments)    States that this med makes him "crazy"  . Tetanus Toxoids Swelling and Other (See Comments)    Fever, Swelling of the arm   . Wellbutrin [Bupropion] Other (See Comments)    Crazy thoughts, nightmares  . Chantix [Varenicline] Other (See Comments)    Dreams   Consultations:  Cardiology  Procedures/Studies: Dg Chest 2 View  Result Date: 06/10/2016 CLINICAL DATA:  Chest pain, shortness of breath and nausea EXAM: CHEST  2 VIEW COMPARISON:  04/19/2016 FINDINGS: Median sternotomy wires are  evident. No acute infiltrate or effusion. Cardiomediastinal silhouette stable. No pneumothorax. Degenerative changes of the spine. IMPRESSION: No active cardiopulmonary disease. Electronically Signed   By: Donavan Foil M.D.   On: 06/10/2016 15:20    CARDIAC CATHETERIZATION  Ost LM lesion, 95 %stenosed.  Ost Cx lesion, 95 %stenosed.  Ost LAD to Prox LAD lesion, 75 %stenosed.  Ost Ramus lesion, 90 %stenosed.  Ramus lesion, 75 %stenosed.  Ost RCA to Prox RCA lesion, 50 %stenosed.  RPDA lesion, 40 %stenosed.  Origin to Mid Graft lesion, 60 %stenosed.  Origin to Mid Graft lesion, 50 %stenosed.  Origin to Prox Graft lesion, 60 %stenosed.   IMPRESSION: Mr. Garciamartinez has unchanged anatomy compared to his Performed in August. He has an occluded diagonal vein graft. The diagonal  fills by left to left collaterals. He has moderate disease in his RCA vein graft and sequential ramus/obtuse marginal branch vein graft which have shown no progression over the last 2-3 months. In addition, his peripheral angiogram shows no obstructive disease in large vessels. I recommend continue medical therapy. She will be removed and pressure held on the groin to achieve hemostasis. The patient left the lab in stable condition. He'll be gently hydrated in the next 4 hours and discharged home. He will follow-up with Dr. Irish Lack .  Subjective: Patient was seen and examined at bedside and all his questions were answered by Cardiology. Had no active complaints and was ready to go home.   Discharge Exam: Vitals:   06/14/16 2130 06/15/16 0520  BP: 112/73 136/85  Pulse: 66 81  Resp: 19 13  Temp: 98.2 F (36.8 C) 97.8 F (36.6 C)   Vitals:   06/14/16 1810 06/14/16 1910 06/14/16 2130 06/15/16 0520  BP: 121/73 139/82 112/73 136/85  Pulse: 70 71 66 81  Resp:   19 13  Temp:   98.2 F (36.8 C) 97.8 F (36.6 C)  TempSrc:   Oral Oral  SpO2: 100% 100% 96% 100%  Weight:    80.6 kg (177 lb 9.6 oz)  Height:       General: Pt is alert, awake, not in acute distress Cardiovascular: RRR, S1/S2 +, no rubs, no gallops Respiratory: CTA bilaterally, no wheezing, no rhonchi Abdominal: Soft, NT, ND, bowel sounds + Extremities: no edema, no cyanosis  The results of significant diagnostics from this hospitalization (including imaging, microbiology, ancillary and laboratory) are listed below for reference.     Microbiology: Recent Results (from the past 240 hour(s))  MRSA PCR Screening     Status: None   Collection Time: 06/11/16 12:07 AM  Result Value Ref Range Status   MRSA by PCR NEGATIVE NEGATIVE Final    Comment:        The GeneXpert MRSA Assay (FDA approved for NASAL specimens only), is one component of a comprehensive MRSA colonization surveillance program. It is not intended to diagnose  MRSA infection nor to guide or monitor treatment for MRSA infections.      Labs: BNP (last 3 results) No results for input(s): BNP in the last 8760 hours. Basic Metabolic Panel:  Recent Labs Lab 06/10/16 1450 06/11/16 0117 06/12/16 0503 06/14/16 0844  NA 141  --  140 142  K 4.7  --  3.9 4.2  CL 105  --  108 107  CO2 29  --  25 29  GLUCOSE 97  --  94 98  BUN 20  --  13 13  CREATININE 1.01 1.01 0.96 1.12  CALCIUM 10.5*  --  9.0 9.7  MG  --   --  2.0 2.2  PHOS  --   --  3.9 2.7   Liver Function Tests:  Recent Labs Lab 06/12/16 0503 06/14/16 0844  AST 15 16  ALT 14* 14*  ALKPHOS 50 58  BILITOT 0.5 0.7  PROT 5.4* 6.7  ALBUMIN 3.4* 4.0   No results for input(s): LIPASE, AMYLASE in the last 168 hours. No results for input(s): AMMONIA in the last 168 hours. CBC:  Recent Labs Lab 06/11/16 0117 06/12/16 0503 06/13/16 0345 06/14/16 0358 06/15/16 0331  WBC 9.0 8.1 7.2 7.6 7.3  HGB 12.4* 12.1* 12.1* 12.3* 12.5*  HCT 37.8* 38.2* 37.4* 38.1* 38.3*  MCV 91.5 93.2 92.6 92.0 91.8  PLT 172 159 168 162 173   Cardiac Enzymes:  Recent Labs Lab 06/10/16 1450 06/11/16 0117 06/11/16 0601 06/11/16 1135  TROPONINI <0.03 <0.03 <0.03 <0.03   BNP: Invalid input(s): POCBNP CBG: No results for input(s): GLUCAP in the last 168 hours. D-Dimer No results for input(s): DDIMER in the last 72 hours. Hgb A1c No results for input(s): HGBA1C in the last 72 hours. Lipid Profile No results for input(s): CHOL, HDL, LDLCALC, TRIG, CHOLHDL, LDLDIRECT in the last 72 hours. Thyroid function studies No results for input(s): TSH, T4TOTAL, T3FREE, THYROIDAB in the last 72 hours.  Invalid input(s): FREET3 Anemia work up No results for input(s): VITAMINB12, FOLATE, FERRITIN, TIBC, IRON, RETICCTPCT in the last 72 hours. Urinalysis    Component Value Date/Time   COLORURINE YELLOW 06/10/2016 1820   APPEARANCEUR CLEAR 06/10/2016 1820   LABSPEC 1.021 06/10/2016 1820   PHURINE 6.0  06/10/2016 1820   GLUCOSEU NEGATIVE 06/10/2016 1820   HGBUR NEGATIVE 06/10/2016 1820   BILIRUBINUR NEGATIVE 06/10/2016 1820   KETONESUR NEGATIVE 06/10/2016 1820   PROTEINUR NEGATIVE 06/10/2016 1820   UROBILINOGEN 0.2 05/13/2014 0658   NITRITE NEGATIVE 06/10/2016 1820   LEUKOCYTESUR NEGATIVE 06/10/2016 1820   Sepsis Labs Invalid input(s): PROCALCITONIN,  WBC,  LACTICIDVEN Microbiology Recent Results (from the past 240 hour(s))  MRSA PCR Screening     Status: None   Collection Time: 06/11/16 12:07 AM  Result Value Ref Range Status   MRSA by PCR NEGATIVE NEGATIVE Final    Comment:        The GeneXpert MRSA Assay (FDA approved for NASAL specimens only), is one component of a comprehensive MRSA colonization surveillance program. It is not intended to diagnose MRSA infection nor to guide or monitor treatment for MRSA infections.    Time coordinating discharge: Over 30 minutes  SIGNED:  Kerney Elbe, DO [facility] 06/15/2016, 1:43 PM Pager [phone]  If 7PM-7AM, please contact night-coverage [email or web address] Password [email or web address]

## 2016-06-15 NOTE — Discharge Instructions (Signed)

## 2016-06-15 NOTE — Progress Notes (Signed)
Patient Name: [name] Date of Encounter: 06/15/2016  Primary Cardiologist: Dr. Marinda Elk Problem List     Principal Problem:   Chest pain Active Problems:   Hepatitis C   Ischemic cardiomyopathy   Essential hypertension   Claudication of both lower extremities (HCC)   Pure hypercholesterolemia   Tobacco abuse disorder   Coronary artery disease involving coronary bypass graft of native heart with unstable angina pectoris [facility])   Patient Profile     [name] is a 60 y.o. year old male with a history of CAD (STEMI 10/2015 s/p POBA of LAD with IABP and subsequent emergent CABG), LIMA-LAD, SVG-Diag, SVG-RI-OM, SVG-PDA. HCV s/p treatment, prior tobacco abuse, GERD, anxiety, chronic systolic CHF (most recent EF 45-50%), tobacco abuse (now quit). He was readmitted 04/21/16 with chest pain>>abnormal stress test>>cath showed occlusion of the SVG-Diag, other grafts patent, EF 50-55%, no PCI. D1 90%.  Now readmitted 06/10/16 with recurrent CP. Troponins were negative, ECG w/ no ST elevation, minor T wave changes.  Underwent LHC 10/17.   Subjective   No chest pain, but remains very anxious.   Inpatient Medications    Scheduled Meds: . aspirin EC  81 mg Oral Daily  . atorvastatin  40 mg Oral Daily  . clopidogrel  75 mg Oral Daily  . famotidine  20 mg Oral BID  . heparin subcutaneous  5,000 Units Subcutaneous Q8H  . lisinopril  5 mg Oral QHS  . polyethylene glycol  17 g Oral Daily  . senna  1 tablet Oral Daily  . sodium chloride flush  3 mL Intravenous Q12H   Continuous Infusions:   PRN Meds: sodium chloride, acetaminophen, chlordiazePOXIDE, hydrALAZINE, hydrALAZINE, morphine injection, nitroGLYCERIN, ondansetron (ZOFRAN) IV, pantoprazole, sodium chloride flush, zolpidem   Vital Signs    Vitals:   06/14/16 1810 06/14/16 1910 06/14/16 2130 06/15/16 0520  BP: 121/73 139/82 112/73 136/85  Pulse: 70 71 66 81  Resp:   19 13  Temp:   98.2 F (36.8 C) 97.8 F  (36.6 C)  TempSrc:   Oral Oral  SpO2: 100% 100% 96% 100%  Weight:    177 lb 9.6 oz (80.6 kg)  Height:        Intake/Output Summary (Last 24 hours) at 06/15/16 0902 Last data filed at 06/14/16 1934  Gross per 24 hour  Intake           980.74 ml  Output                0 ml  Net           980.74 ml   Filed Weights   06/13/16 0409 06/14/16 0554 06/15/16 0520  Weight: 179 lb 6.4 oz (81.4 kg) 178 lb 14.4 oz (81.1 kg) 177 lb 9.6 oz (80.6 kg)    Physical Exam    GEN: Well nourished, well developed, in no acute distress.  HEENT: Grossly normal.  Neck: Supple, no JVD, carotid bruits, or masses. Cardiac: RRR, no murmurs, rubs, or gallops. No clubbing, cyanosis, edema.  Radials/DP/PT 2+ and equal bilaterally.  Respiratory:  Respirations regular and unlabored, clear to auscultation bilaterally. GI: Soft, nontender, nondistended, BS + x 4. MS: no deformity or atrophy. Skin: warm and dry, no rash. Right femoral cath site stable with minimal bruising.  Neuro:  Strength and sensation are intact. Psych: AAOx3.  Normal affect.  Labs    CBC  Recent Labs  06/14/16 0358 06/15/16 0331  WBC 7.6 7.3  HGB 12.3*  12.5*  HCT 38.1* 38.3*  MCV 92.0 91.8  PLT 162 924   Basic Metabolic Panel  Recent Labs  06/14/16 0844  NA 142  K 4.2  CL 107  CO2 29  GLUCOSE 98  BUN 13  CREATININE 1.12  CALCIUM 9.7  MG 2.2  PHOS 2.7   Liver Function Tests  Recent Labs  06/14/16 0844  AST 16  ALT 14*  ALKPHOS 58  BILITOT 0.7  PROT 6.7  ALBUMIN 4.0   No results for input(s): LIPASE, AMYLASE in the last 72 hours. Cardiac Enzymes No results for input(s): CKTOTAL, CKMB, CKMBINDEX, TROPONINI in the last 72 hours. BNP Invalid input(s): POCBNP D-Dimer No results for input(s): DDIMER in the last 72 hours. Hemoglobin A1C No results for input(s): HGBA1C in the last 72 hours. Fasting Lipid Panel No results for input(s): CHOL, HDL, LDLCALC, TRIG, CHOLHDL, LDLDIRECT in the last 72  hours. Thyroid Function Tests No results for input(s): TSH, T4TOTAL, T3FREE, THYROIDAB in the last 72 hours.  Invalid input(s): FREET3  Telemetry    NSR, Tachy around 3am - Personally Reviewed  ECG    06/13/16 NSR, low voltage - Personally Reviewed  Radiology    10/16  Conclusion     Ost LM lesion, 95 %stenosed.  Ost Cx lesion, 95 %stenosed.  Ost LAD to Prox LAD lesion, 75 %stenosed.  Ost Ramus lesion, 90 %stenosed.  Ramus lesion, 75 %stenosed.  Ost RCA to Prox RCA lesion, 50 %stenosed.  RPDA lesion, 40 %stenosed.  Origin to Mid Graft lesion, 60 %stenosed.  Origin to Mid Graft lesion, 50 %stenosed.  Origin to Prox Graft lesion, 60 %stenosed.     Cardiac Studies   LHC/ PV angiogram - results pending  Patient Profile     [name] is a 60 y.o. year old male with a history of CAD (STEMI 10/2015 s/p POBA of LAD with IABP and subsequent emergent CABG), LIMA-LAD, SVG-Diag, SVG-RI-OM, SVG-PDA. HCV s/p treatment, prior tobacco abuse, GERD, anxiety, chronic systolic CHF (most recent EF 45-50%), tobacco abuse (now quit). He was readmitted 04/21/16 with chest pain>>abnormal stress test>>cath showed occlusion of the SVG-Diag, other grafts patent, EF 50-55%, no PCI. D1 90%.  Now readmitted 06/10/16 with recurrent CP. Troponins were negative, ECG w/ no ST elevation, minor T wave changes.  Also with PVD/ claudication. Underwent LHC and PV procedure yesterday.   Assessment & Plan    1.  Chest pain - recurrent, started with palpitations during exertion - improved w/ SL NTG, remains CP free - ez neg so far, ECG w/ no ST elevation, minor T wave changes - had early graft closure with SVG-Diag 100% 03/2016 - underwent LHC yesterday with no changes noted from previous cath in 8/17 - continue medical therapy: ASA, Plavix, Lipitor and lisinopril. Not on BB at present. Borderline bradycardia at baseline. May benefit from Imdur?  2.  PAD/claudication - Dr [name] did PV cath  yesterday showing no significant PVD - Continue ASA and Plavix   3. Ischemic cardiomyopathy -  EF 45-50% on last echo 12/2015 (previously 25-30% at time of MI 10/2015) - continue ACE-I therapy with lisinopril - not on BB due to bradycardia      4.  Essential hypertension -  BP is well controlled with lisinopril  5. Hepatitis C -  Per IM   Signed, Reino Bellis, NP  06/15/2016, 9:02 AM   I have examined the patient and reviewed assessment and plan and discussed with patient.  Agree with above as  stated.  Had a long conversation with the patient regarding his cath results and his LE angio results.  He had a lot of anxiety about moderate disease in the grafts.  I also explained to his that his foot discoloration is likely microvascular disease.  If he has a BP spike > 150/90, he can take an extra Lisinopril 5 mg tablet.  He said he felt less anxious about his health after our conversation.   Larae Grooms

## 2016-06-16 ENCOUNTER — Telehealth (HOSPITAL_COMMUNITY): Payer: Self-pay

## 2016-06-16 NOTE — Telephone Encounter (Signed)
-----   Message from [name], MD sent at 03/11/2016 10:18 PM EDT ----- Regarding: RE: THR increase Ok to increase as noted below.  JV ----- Message -----    From: Lequita Halt    Sent: 03/10/2016   5:13 PM      To: [name], MD Subject: THR increase                                   Patient has been in cardiac rehab approximately 5 weeks and is doing well. BP's WNL's, but HR's are beginning to exceed Target Heart Rate (THR) of 72-128 bpm (45%-80% of age predicted max HR). If no GXT is planned for the near future and MD agrees, request to increase THR to 45-95% of age predicted max HR 72-152bpm.   Below are some examples of max HR while on stationary upright bike (airdyne)  02/09/16 max HR 156 on airdyne bike   02/23/16 max HR 143 on airdyne bike  02/25/16 max HR 142 on airdyne bike  03/08/16 max HR 150 on airdyne bike  03/10/16 max HR 153 on airdyne bike    Thank you,   [employer]

## 2016-06-17 ENCOUNTER — Telehealth: Payer: Self-pay | Admitting: Interventional Cardiology

## 2016-06-17 NOTE — Telephone Encounter (Signed)
New Message:    Pt was discharged from the hospital on Tuesday. Dr Annabell Sabal told him if his blood pressure got over 150 when he was exercising to take another blood pressure pill. He is just sitting at his desk and his blood pressure is 160/95. Please call to advise.

## 2016-06-17 NOTE — Telephone Encounter (Signed)
Spoke with pt, his blood pressure has been elevated this morning, he takes his lisinopril in the evening. He wanted to make sure his directions at discharge to take an extra lisinopril with blood pressure spikes is correct. He is quite anxious about the recent MI and his bp being elevated and today is his first day back to work. Explained to patient the correlation between anxiety and elevated bp. Patient was given the okay to take an extra 5 mg of lisinopril now. He questions if he should still exercise. Patient advised not to exercise now but later today if he feels okay he can have a nice walk but not to work hard. He will check his bp once daily and track and he will call back if his bp continues to be elevated after several hours after the extra lisinopril.

## 2016-06-22 NOTE — Addendum Note (Signed)
Encounter addended by: Sol Passer on: 06/22/2016 10:57 AM<BR>    Actions taken: Visit Navigator Flowsheet section accepted, Vitals modified

## 2016-06-29 ENCOUNTER — Encounter: Payer: Self-pay | Admitting: Interventional Cardiology
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
## 2016-07-12 NOTE — Progress Notes (Deleted)
Cardiology Office Note   Date:  07/12/2016   ID:  [name], DOB December 26, 1955, MRN [id number]  PCP:  [name], MD    No chief complaint on file. CAD   Wt Readings from Last 3 Encounters:  06/15/16 80.6 kg (177 lb 9.6 oz)  05/18/16 83.2 kg (183 lb 6.4 oz)  04/21/16 78 kg (172 lb)       History of Present Illness: [name] is a 60 y.o. male with history of CAD (STEMI 10/2015 s/p POBA of LAD with IABP and subsequent emergent CABG), HCV s/p treatment, prior tobacco abuse, GERD, anxiety, chronic systolic CHF (most recent EF 45-50%), tobacco abuse who presents for f/u. He was previously seen in the pharmacist-driven HTN clinic for BP med adjustment at which time it was noted his home cuff was overestimating systolic blood pressure by 10-15 points. Last echo 12/2015 to re-eval EF showed improvement to 45-50%.   He has had [facility], which he now thinks are allergies.  He continues to have some mental slowness and fatigue.  He has leg aching, worse when he walks a lot.    He has imporoved his diet signficantly since the MI.    His appetite is increasing.    He felt better when taking Cymbalta.  He stopped it.  He is willing to take this again  He will follow up with his psychiatrist.     Past Medical History:  Diagnosis Date  . Acute hepatitis C without mention of hepatic coma(070.51)    Tx by [facility] for Hep.  negative for hep c 2/15.  Marland Kitchen Anxiety   . CAD (coronary artery disease)    a. 10/2015 ant STEMI >> LHC with 3 v CAD; oLAD tx with POBA >> emergent CABG.  . Chronic systolic CHF (congestive heart failure) ([place])   . Colitis   . Esophageal reflux    eosinophil esophagitis  . Former tobacco use   . Ischemic cardiomyopathy    a. EF 25-30% at intraop TEE 4/17  //  b. Limited Echo 5/17 - EF 45-50%, mild ant HK  . Sinus bradycardia    a. HR dropping into 40s in 02/2016 -> BB reduced.  . Symptomatic hypotension    a. 02/2016 ER visit -> meds reduced.    Past Surgical  History:  Procedure Laterality Date  . CARDIAC CATHETERIZATION N/A 11/28/2015   Procedure: Left Heart Cath and Coronary Angiography;  Surgeon: [name], MD;  Location: [place] [facility];  Service: Cardiovascular;  Laterality: N/A;  . CARDIAC CATHETERIZATION N/A 11/28/2015   Procedure: Coronary Balloon Angioplasty;  Surgeon: [name], MD;  Location: [place] [facility];  Service: Cardiovascular;  Laterality: N/A;  ostial LAD  . CARDIAC CATHETERIZATION N/A 11/28/2015   Procedure: Coronary/Graft Angiography;  Surgeon: [name], MD;  Location: [place] [facility];  Service: Cardiovascular;  Laterality: N/A;  coronaries only   . CARDIAC CATHETERIZATION N/A 04/21/2016   Procedure: Left Heart Cath and Coronary Angiography;  Surgeon: Wellington Hampshire, MD;  Location: [place] [facility];  Service: Cardiovascular;  Laterality: N/A;  . CARDIAC CATHETERIZATION N/A 06/14/2016   Procedure: Left Heart Cath and Cors/Grafts Angiography;  Surgeon: Lorretta Harp, MD;  Location: [place] [facility];  Service: Cardiovascular;  Laterality: N/A;  . CORONARY ARTERY BYPASS GRAFT N/A 11/28/2015   Procedure: CORONARY ARTERY BYPASS GRAFTING (CABG) TIMES FIVE USING LEFT INTERNAL MAMMARY ARTERY AND RIGHT GREATER SAPHENOUS,VIEN HARVEATED BY ENDOVIEN, INTRAOPPRATIVE TEE;  Surgeon: Gaye Pollack, MD;  Location: [place];  Service: Open Heart Surgery;  Laterality: N/A;  . PERIPHERAL VASCULAR CATHETERIZATION N/A 06/14/2016   Procedure: Lower Extremity Angiography;  Surgeon: Lorretta Harp, MD;  Location: [place] [facility];  Service: Cardiovascular;  Laterality: N/A;  . SHOULDER SURGERY       Current Outpatient Prescriptions  Medication Sig Dispense Refill  . aspirin 81 MG tablet Take 81 mg by mouth daily.    Marland Kitchen atorvastatin (LIPITOR) 40 MG tablet Take 40 mg by mouth daily.    . chlordiazePOXIDE (LIBRIUM) 25 MG capsule Take 25 mg by mouth daily as needed for anxiety.     . clopidogrel (PLAVIX) 75  MG tablet Take 75 mg by mouth daily.    Marland Kitchen lisinopril (PRINIVIL,ZESTRIL) 5 MG tablet Take 5 mg by mouth daily.    . Multiple Vitamins-Minerals (ONE-A-DAY MENS 50+ ADVANTAGE PO) Take 1 tablet by mouth daily.     . nitroGLYCERIN (NITROSTAT) 0.4 MG SL tablet Place 0.4 mg under the tongue every 5 (five) minutes as needed for chest pain. 3 DOSE MAX    . pantoprazole (PROTONIX) 40 MG tablet Take 40 mg by mouth daily as needed (FOR REFLUX). FOR REFLUX    . ranitidine (ZANTAC) 150 MG tablet Take 150 mg by mouth daily as needed for heartburn.     . zolpidem (AMBIEN) 10 MG tablet Take 10 mg by mouth at bedtime as needed for sleep. Reported on 02/09/2016     No current facility-administered medications for this visit.     Allergies:   Prednisone; Tetanus toxoids; Wellbutrin [bupropion]; and Chantix [varenicline]    Social History:  The patient  reports that he quit smoking about 7 months ago. His smoking use included Cigarettes. He smoked 0.50 packs per day. He has never used smokeless tobacco. He reports that he does not drink alcohol or use drugs.   Family History:  The patient's family history includes Heart Problems in his father; Heart attack in his maternal grandmother and paternal uncle; Heart attack (age of onset: 89) in his father; Hypertension in his brother; Lung cancer in his mother; Stroke in his father and maternal grandmother.    ROS:  Please see the history of present illness.   Otherwise, review of systems are positive for leg aching.   All other systems are reviewed and negative.    PHYSICAL EXAM: VS:  There were no vitals taken for this visit. , BMI There is no height or weight on file to calculate BMI. GEN: Well nourished, well developed, in no acute distress  HEENT: normal  Neck: no JVD, carotid bruits, or masses Cardiac: RRR; no murmurs, rubs, or gallops,no edema  Respiratory:  clear to auscultation bilaterally, normal work of breathing GI: soft, nontender, nondistended, +  BS MS: no deformity or atrophy  Skin: warm and dry, no rash Neuro:  Strength and sensation are intact Psych: euthymic mood, full affect     Recent Labs: 06/14/2016: ALT 14; BUN 13; Creatinine, Ser 1.12; Magnesium 2.2; Potassium 4.2; Sodium 142 06/15/2016: Hemoglobin 12.5; Platelets 173   Lipid Panel    Component Value Date/Time   CHOL 174 04/06/2016 1026   TRIG 73 04/06/2016 1026   HDL 50 04/06/2016 1026   CHOLHDL 3.5 04/06/2016 1026   VLDL 15 04/06/2016 1026   LDLCALC 109 04/06/2016 1026     Other studies Reviewed: Additional studies/ records that were reviewed today with results demonstrating: cath records.  Bloo dwork has ben  normal .  No TSH checked.   ASSESSMENT AND PLAN:  1. CAD/MI: No angina.  No heart failure.  COntinue secondary prevention. 2. Hyperlipidemia:  LDL increased on the atorva 40 instead of atorva 80 mg. He is going to try to see if LDL comes down with better lifestyle changes.  3. Depression:  I suggested that he f/u with his psychiatrist and restart Cymbalta.  He felt better mentally when taking this medicine in the past.  4. SHOB: Improved.  I don't think this was cardiac.  He thinks this is from allergies. 5. Bradycardia: noted during sleep.  3.5 second pause.  No bradycardia noted while awake.  I reassured him that such a pause during sleep was not dangerous to him.    Current medicines are reviewed at length with the patient today.  The patient concerns regarding his medicines were addressed.  The following changes have been made:  No change  Labs/ tests ordered today include:  No orders of the defined types were placed in this encounter.   Recommend 150 minutes/week of aerobic exercise Low fat, low carb, high fiber diet recommended  Disposition:   FU in 6 months   Signed, Larae Grooms, MD  07/12/2016 6:47 PM    [facility] [place], [place],   [address] Phone: [phone]; Fax: [phone]

## 2016-07-13 ENCOUNTER — Ambulatory Visit: Payer: BC Managed Care – PPO | Admitting: Interventional Cardiology

## 2016-08-04 NOTE — Progress Notes (Signed)
Cardiology Office Note   Date:  08/05/2016   ID:  [name], DOB 20-Dec-1955, MRN [id number]  PCP:  Anthony Melter, MD    No chief complaint on file. CAD   Wt Readings from Last 3 Encounters:  08/05/16 185 lb (83.9 kg)  06/15/16 177 lb 9.6 oz (80.6 kg)  05/18/16 183 lb 6.4 oz (83.2 kg)       History of Present Illness: [name] is a 60 y.o. male with history of CAD (STEMI 10/2015 s/p POBA of LAD with IABP and subsequent emergent CABG), HCV s/p treatment, prior tobacco abuse, GERD, anxiety, chronic systolic CHF (most recent EF 45-50%), tobacco abuse who presents for f/u. He was previously seen in the pharmacist-driven HTN clinic for BP med adjustment at which time it was noted his home cuff was overestimating systolic blood pressure by 10-15 points. Last echo 12/2015 to re-eval EF showed improvement to 45-50%.   He has imporoved his diet signficantly since the MI.    His appetite is increasing.    He felt better when taking Cymbalta.  He stopped it.  He is willing to take this again  He will follow up with his psychiatrist.   Cath post CABG showed patent grafts with some disease noted.  LE angio was negative.  He continues to walk for exercise.  He is concerned that his BP goes up to 716 systolic with exercise.  He is interested in doing some weight training.  He is bothered by allergies.      Past Medical History:  Diagnosis Date  . Acute hepatitis C without mention of hepatic coma(070.51)    Tx by [facility] for Hep.  negative for hep c 2/15.  Marland Kitchen Anxiety   . CAD (coronary artery disease)    a. 10/2015 ant STEMI >> LHC with 3 v CAD; oLAD tx with POBA >> emergent CABG.  . Chronic systolic CHF (congestive heart failure) ([place])   . Colitis   . Esophageal reflux    eosinophil esophagitis  . Former tobacco use   . Ischemic cardiomyopathy    a. EF 25-30% at intraop TEE 4/17  //  b. Limited Echo 5/17 - EF 45-50%, mild ant HK  . Sinus bradycardia    a. HR dropping into  40s in 02/2016 -> BB reduced.  . Symptomatic hypotension    a. 02/2016 ER visit -> meds reduced.    Past Surgical History:  Procedure Laterality Date  . CARDIAC CATHETERIZATION N/A 11/28/2015   Procedure: Left Heart Cath and Coronary Angiography;  Surgeon: [name], MD;  Location: [place] [facility];  Service: Cardiovascular;  Laterality: N/A;  . CARDIAC CATHETERIZATION N/A 11/28/2015   Procedure: Coronary Balloon Angioplasty;  Surgeon: [name], MD;  Location: [place] [facility];  Service: Cardiovascular;  Laterality: N/A;  ostial LAD  . CARDIAC CATHETERIZATION N/A 11/28/2015   Procedure: Coronary/Graft Angiography;  Surgeon: [name], MD;  Location: [place] [facility];  Service: Cardiovascular;  Laterality: N/A;  coronaries only   . CARDIAC CATHETERIZATION N/A 04/21/2016   Procedure: Left Heart Cath and Coronary Angiography;  Surgeon: Wellington Hampshire, MD;  Location: [place] [facility];  Service: Cardiovascular;  Laterality: N/A;  . CARDIAC CATHETERIZATION N/A 06/14/2016   Procedure: Left Heart Cath and Cors/Grafts Angiography;  Surgeon: Lorretta Harp, MD;  Location: [place] [facility];  Service: Cardiovascular;  Laterality: N/A;  . CORONARY ARTERY BYPASS GRAFT N/A 11/28/2015   Procedure: CORONARY ARTERY  BYPASS GRAFTING (CABG) TIMES FIVE USING LEFT INTERNAL MAMMARY ARTERY AND RIGHT GREATER SAPHENOUS,VIEN HARVEATED BY ENDOVIEN, INTRAOPPRATIVE TEE;  Surgeon: Gaye Pollack, MD;  Location: [place];  Service: Open Heart Surgery;  Laterality: N/A;  . PERIPHERAL VASCULAR CATHETERIZATION N/A 06/14/2016   Procedure: Lower Extremity Angiography;  Surgeon: Lorretta Harp, MD;  Location: [place] [facility];  Service: Cardiovascular;  Laterality: N/A;  . SHOULDER SURGERY       Current Outpatient Prescriptions  Medication Sig Dispense Refill  . aspirin 81 MG tablet Take 81 mg by mouth daily.    Marland Kitchen atorvastatin (LIPITOR) 40 MG tablet Take 40 mg by mouth daily.    .  chlordiazePOXIDE (LIBRIUM) 25 MG capsule Take 25 mg by mouth daily as needed for anxiety.     . clopidogrel (PLAVIX) 75 MG tablet Take 75 mg by mouth daily.    . DULoxetine (CYMBALTA) 60 MG capsule Take 60 mg by mouth daily.     Marland Kitchen Fexofenadine HCl (MUCINEX ALLERGY PO) Take 1 tablet by mouth daily.    . fluticasone (FLONASE) 50 MCG/ACT nasal spray Place 2 sprays into both nostrils daily.    Marland Kitchen lisinopril (PRINIVIL,ZESTRIL) 5 MG tablet Take 5 mg by mouth daily.    . Multiple Vitamins-Minerals (ONE-A-DAY MENS 50+ ADVANTAGE PO) Take 1 tablet by mouth daily.     . nitroGLYCERIN (NITROSTAT) 0.4 MG SL tablet Place 0.4 mg under the tongue every 5 (five) minutes as needed for chest pain. 3 DOSE MAX    . pantoprazole (PROTONIX) 40 MG tablet Take 40 mg by mouth daily as needed (FOR REFLUX). FOR REFLUX    . ranitidine (ZANTAC) 150 MG tablet Take 150 mg by mouth daily as needed for heartburn.     . zolpidem (AMBIEN) 10 MG tablet Take 10 mg by mouth at bedtime as needed for sleep. Reported on 02/09/2016     No current facility-administered medications for this visit.     Allergies:   Prednisone; Tetanus toxoids; Wellbutrin [bupropion]; and Chantix [varenicline]    Social History:  The patient  reports that he quit smoking about 8 months ago. His smoking use included Cigarettes. He smoked 0.50 packs per day. He has never used smokeless tobacco. He reports that he does not drink alcohol or use drugs.   Family History:  The patient's family history includes Heart Problems in his father; Heart attack in his maternal grandmother and paternal uncle; Heart attack (age of onset: 79) in his father; Hypertension in his brother; Lung cancer in his mother; Stroke in his father and maternal grandmother.    ROS:  Please see the history of present illness.   Otherwise, review of systems are positive for increased BP with activity.   All other systems are reviewed and negative.    PHYSICAL EXAM: VS:  BP 130/80   Pulse  81   Ht '5\' 9"'$  (1.753 m)   Wt 185 lb (83.9 kg)   BMI 27.32 kg/m  , BMI Body mass index is 27.32 kg/m. GEN: Well nourished, well developed, in no acute distress  HEENT: normal  Neck: no JVD, carotid bruits, or masses Cardiac: RRR; no murmurs, rubs, or gallops,no edema  Respiratory:  clear to auscultation bilaterally, normal work of breathing GI: soft, nontender, nondistended, + BS MS: no deformity or atrophy  Skin: warm and dry, no rash Neuro:  Strength and sensation are intact Psych: euthymic mood, full affect     Recent Labs: 06/14/2016: ALT 14; BUN 13; Creatinine,  Ser 1.12; Magnesium 2.2; Potassium 4.2; Sodium 142 06/15/2016: Hemoglobin 12.5; Platelets 173   Lipid Panel    Component Value Date/Time   CHOL 174 04/06/2016 1026   TRIG 73 04/06/2016 1026   HDL 50 04/06/2016 1026   CHOLHDL 3.5 04/06/2016 1026   VLDL 15 04/06/2016 1026   LDLCALC 109 04/06/2016 1026     Other studies Reviewed: Additional studies/ records that were reviewed today with results demonstrating: cath records.  Blood work has been normal .  No TSH checked.   ASSESSMENT AND PLAN:  1. CAD/MI: No angina.  No heart failure.  Continue secondary prevention.   2. Hyperlipidemia:  LDL increased on the atorva 40 instead of atorva 80 mg. Will go back to 80 mg daily.  Recheck lipids in 3 months.  3. Depression:  Back on Cymbalta.  Seems to be better from a mental standpoint. 4. SHOB: Improved.  Increase exercise carefully.   He thinks this is from allergies.  OK to increase exercise and add weights.  Avoid straining.    Current medicines are reviewed at length with the patient today.  The patient concerns regarding his medicines were addressed.  The following changes have been made:  No change  Labs/ tests ordered today include:  No orders of the defined types were placed in this encounter.   Recommend 150 minutes/week of aerobic exercise Low fat, low carb, high fiber diet recommended  Disposition:    FU in 6 months   Signed, Larae Grooms, MD  08/05/2016 11:27 AM    [facility] [place], [place], [place]  [address] Phone: [phone]; Fax: [phone]

## 2016-08-05 ENCOUNTER — Encounter (INDEPENDENT_AMBULATORY_CARE_PROVIDER_SITE_OTHER): Payer: Self-pay

## 2016-08-05 ENCOUNTER — Encounter: Payer: Self-pay | Admitting: Interventional Cardiology

## 2016-08-05 ENCOUNTER — Ambulatory Visit (INDEPENDENT_AMBULATORY_CARE_PROVIDER_SITE_OTHER): Payer: BC Managed Care – PPO | Admitting: Interventional Cardiology

## 2016-08-05 VITALS — BP 130/80 | HR 81 | Ht 69.0 in | Wt 185.0 lb

## 2016-08-05 DIAGNOSIS — I2510 Atherosclerotic heart disease of native coronary artery without angina pectoris: Secondary | ICD-10-CM

## 2016-08-05 DIAGNOSIS — E785 Hyperlipidemia, unspecified: Secondary | ICD-10-CM | POA: Diagnosis not present

## 2016-08-05 DIAGNOSIS — I252 Old myocardial infarction: Secondary | ICD-10-CM

## 2016-08-05 MED ORDER — ATORVASTATIN CALCIUM 80 MG PO TABS: 80.0000 mg | ORAL_TABLET | Freq: Every day | ORAL | 3 refills | Status: DC

## 2016-08-05 NOTE — Patient Instructions (Signed)
**Note De-Identified  Obfuscation** Medication Instructions:  Increase Atorvastatin to 80 mg daily. All other medications remain the same.  Labwork: Lipids and CMET on 11/03/16 any time between 7:30 and 5:00. Please do not eat or drink after midnight the night before labs are drawn.  Testing/Procedures: none  Follow-Up: Your physician wants you to follow-up in: 6 months. You will receive a reminder letter in the mail two months in advance. If you don't receive a letter, please call our office to schedule the follow-up appointment.     If you need a refill on your cardiac medications before your next appointment, please call your pharmacy.

## 2016-08-12 ENCOUNTER — Telehealth: Payer: Self-pay | Admitting: Interventional Cardiology

## 2016-08-12 MED ORDER — EZETIMIBE 10 MG PO TABS: 10.0000 mg | ORAL_TABLET | Freq: Every day | ORAL | 11 refills | Status: DC

## 2016-08-12 MED ORDER — ATORVASTATIN CALCIUM 40 MG PO TABS: 40.0000 mg | ORAL_TABLET | Freq: Every day | ORAL | 11 refills | Status: DC

## 2016-08-12 NOTE — Telephone Encounter (Signed)
Pt c/o medication issue:  1. Name of Medication: Atorvastatin  2. How are you currently taking this medication (dosage and times per day)? 80 mg 1 qd  3. Are you having a reaction (difficulty breathing--STAT)? Diarrhea, stomach ache, and some confusion  4. What is your medication issue? Atorvastatin was increased, symptoms started within 2-3 days, thinks it to much for his system  [phone]

## 2016-08-12 NOTE — Telephone Encounter (Signed)
OK to decrease atorvastatin to 40 mg daily. Start zetia 10 mg daily in addition.  Disp 30, RF 11

## 2016-08-12 NOTE — Telephone Encounter (Signed)
Patient complaining of diarrhea, upset stomach, and some slight confusion since increasing his Atorvastatin from 40 mg QD to 80 mg QD on 08/05/16. He denies any fever, or vomiting and says that he doesn't believe it is a virus. Patient believes that it is related to the increase in atorvastatin. Please advise.

## 2016-08-12 NOTE — Telephone Encounter (Signed)
Patient called and informed that Dr. Irish Lack wanted him to decrease his atorvastatin to 40 mg daily and start zetia 10 mg daily. Patient was in agreement with plan. Medications ordered and sent to the pharmacy. Patient verbalized understanding and appreciated the call.

## 2016-08-27 ENCOUNTER — Telehealth: Payer: Self-pay | Admitting: Interventional Cardiology

## 2016-08-27 NOTE — Telephone Encounter (Signed)
New message   Patient calling C/O having a virus & blood pressure running high.   Pt C/O BP issue: STAT if pt C/O blurred vision, one-sided weakness or slurred speech  1. What are your last 5 BP readings? This am 165/98 retake 158/95   2. Are you having any other symptoms (ex. Dizziness, headache, blurred vision, passed out)? Headache, mediation was given for cough,.  Patient states Side effect from medication causes headache.    3. What is your BP issue? Patient is concern would like a nurse to call back.

## 2016-08-27 NOTE — Telephone Encounter (Signed)
Pt states that when he woke up this morning he just felt like something wasn't right so he checked his BP and it was 160?/98 (he wasn't sure if this was correct).  Pt went to work and checked it again at lunch and it was 158/95.  Denies lightheadedness, dizziness, SOB, or other cardiac issues.  Pt went to Urgent Care on Wednesday because he hasn't been feeling well.  Pt states he has a virus or chest cold and is currently feeling better from this issue but not totally resolved.[name]  Has had a HA daily and had slight chest discomfort when he was at Urgent Care.  No discomfort at this time.  Pt has had a cough and is now on Tessalon Pearls which is helping.  Advised pt to monitor BP daily after medications are taken and to call our office with those readings on Tuesday.  Advised BP possibly up due to current illness.  Will route to Dr. Irish Lack for review and further advisement.  Pt appreciative for help.

## 2016-09-01 MED ORDER — LISINOPRIL 5 MG PO TABS: ORAL_TABLET | ORAL | 3 refills | Status: DC

## 2016-09-01 NOTE — Telephone Encounter (Signed)
If systolc is above 150 more than an hour after he took his BP meds, he can take an extra lisinopril 5 mg.

## 2016-09-01 NOTE — Telephone Encounter (Signed)
**Note De-Identified  Obfuscation** The pt is advised and he verbalized understanding and states that he is in agreement with plan. Per his request I have sent his Lisinopril RX to his pharmacy to fill with additional tablets to take for SBP greater than 150.

## 2016-09-07 ENCOUNTER — Inpatient Hospital Stay (HOSPITAL_COMMUNITY)
Admission: EM | Admit: 2016-09-07 | Discharge: 2016-09-09 | DRG: 287 | Disposition: A | Discharge status: Home / Self Care | Payer: BC Managed Care – PPO | Attending: Cardiovascular Disease | Admitting: Cardiovascular Disease

## 2016-09-07 ENCOUNTER — Encounter (HOSPITAL_COMMUNITY): Payer: Self-pay

## 2016-09-07 ENCOUNTER — Emergency Department (HOSPITAL_COMMUNITY): Payer: BC Managed Care – PPO

## 2016-09-07 DIAGNOSIS — Z87891 Personal history of nicotine dependence: Secondary | ICD-10-CM | POA: Diagnosis not present

## 2016-09-07 DIAGNOSIS — E7800 Pure hypercholesterolemia, unspecified: Secondary | ICD-10-CM | POA: Diagnosis present

## 2016-09-07 DIAGNOSIS — Z823 Family history of stroke: Secondary | ICD-10-CM

## 2016-09-07 DIAGNOSIS — Z888 Allergy status to other drugs, medicaments and biological substances status: Secondary | ICD-10-CM

## 2016-09-07 DIAGNOSIS — Z801 Family history of malignant neoplasm of trachea, bronchus and lung: Secondary | ICD-10-CM | POA: Diagnosis not present

## 2016-09-07 DIAGNOSIS — R079 Chest pain, unspecified: Secondary | ICD-10-CM

## 2016-09-07 DIAGNOSIS — I252 Old myocardial infarction: Secondary | ICD-10-CM

## 2016-09-07 DIAGNOSIS — F419 Anxiety disorder, unspecified: Secondary | ICD-10-CM | POA: Diagnosis present

## 2016-09-07 DIAGNOSIS — Z8249 Family history of ischemic heart disease and other diseases of the circulatory system: Secondary | ICD-10-CM

## 2016-09-07 DIAGNOSIS — I5022 Chronic systolic (congestive) heart failure: Secondary | ICD-10-CM | POA: Diagnosis present

## 2016-09-07 DIAGNOSIS — I25710 Atherosclerosis of autologous vein coronary artery bypass graft(s) with unstable angina pectoris: Secondary | ICD-10-CM | POA: Diagnosis present

## 2016-09-07 DIAGNOSIS — I255 Ischemic cardiomyopathy: Secondary | ICD-10-CM | POA: Diagnosis present

## 2016-09-07 DIAGNOSIS — Z951 Presence of aortocoronary bypass graft: Secondary | ICD-10-CM

## 2016-09-07 DIAGNOSIS — I110 Hypertensive heart disease with heart failure: Secondary | ICD-10-CM | POA: Diagnosis present

## 2016-09-07 DIAGNOSIS — Z887 Allergy status to serum and vaccine status: Secondary | ICD-10-CM | POA: Diagnosis not present

## 2016-09-07 DIAGNOSIS — Z7902 Long term (current) use of antithrombotics/antiplatelets: Secondary | ICD-10-CM | POA: Diagnosis not present

## 2016-09-07 DIAGNOSIS — K219 Gastro-esophageal reflux disease without esophagitis: Secondary | ICD-10-CM | POA: Diagnosis present

## 2016-09-07 DIAGNOSIS — I25110 Atherosclerotic heart disease of native coronary artery with unstable angina pectoris: Principal | ICD-10-CM | POA: Diagnosis present

## 2016-09-07 DIAGNOSIS — I200 Unstable angina: Secondary | ICD-10-CM | POA: Diagnosis not present

## 2016-09-07 DIAGNOSIS — Z7982 Long term (current) use of aspirin: Secondary | ICD-10-CM | POA: Diagnosis not present

## 2016-09-07 HISTORY — DX: Dyspnea, unspecified: R06.00

## 2016-09-07 LAB — COMPREHENSIVE METABOLIC PANEL
ALT: 18 U/L (ref 17–63)
AST: 18 U/L (ref 15–41)
Albumin: 4.4 g/dL (ref 3.5–5.0)
Alkaline Phosphatase: 62 U/L (ref 38–126)
Anion gap: 7 (ref 5–15)
BUN: 19 mg/dL (ref 6–20)
CO2: 28 mmol/L (ref 22–32)
Calcium: 10 mg/dL (ref 8.9–10.3)
Chloride: 105 mmol/L (ref 101–111)
Creatinine, Ser: 1.1 mg/dL (ref 0.61–1.24)
GFR calc Af Amer: >60 mL/min (ref >60)
GFR calc non Af Amer: >60 mL/min (ref >60)
Glucose, Bld: 103 mg/dL — ABNORMAL HIGH (ref 65–99)
Potassium: 4.9 mmol/L (ref 3.5–5.1)
Sodium: 140 mmol/L (ref 135–145)
Total Bilirubin: 0.6 mg/dL (ref 0.3–1.2)
Total Protein: 7.4 g/dL (ref 6.5–8.1)

## 2016-09-07 LAB — CBC WITH DIFFERENTIAL/PLATELET
Basophils Absolute: 0 10*3/uL (ref 0.0–0.1)
Basophils Relative: 0 %
Eosinophils Absolute: 0.1 10*3/uL (ref 0.0–0.7)
Eosinophils Relative: 1 %
HCT: 45.2 % (ref 39.0–52.0)
Hemoglobin: 14.8 g/dL (ref 13.0–17.0)
Lymphocytes Relative: 21 %
Lymphs Abs: 1.7 10*3/uL (ref 0.7–4.0)
MCH: 30.2 pg (ref 26.0–34.0)
MCHC: 32.7 g/dL (ref 30.0–36.0)
MCV: 92.2 fL (ref 78.0–100.0)
Monocytes Absolute: 0.4 10*3/uL (ref 0.1–1.0)
Monocytes Relative: 5 %
Neutro Abs: 5.7 10*3/uL (ref 1.7–7.7)
Neutrophils Relative %: 73 %
Platelets: 195 10*3/uL (ref 150–400)
RBC: 4.9 MIL/uL (ref 4.22–5.81)
RDW: 13.1 % (ref 11.5–15.5)
WBC: 7.8 10*3/uL (ref 4.0–10.5)

## 2016-09-07 LAB — I-STAT TROPONIN, ED: Troponin i, poc: 0 ng/mL (ref 0.00–0.08)

## 2016-09-07 MED ORDER — HYDROMORPHONE HCL 2 MG/ML IJ SOLN
0.5000 mg | Freq: Once | INTRAMUSCULAR | Status: AC
  Administered 2016-09-07: 0.5 mg via INTRAVENOUS
  Filled 2016-09-07: qty 1

## 2016-09-07 MED ORDER — LISINOPRIL 5 MG PO TABS
5.0000 mg | ORAL_TABLET | Freq: Every day | ORAL | Status: DC
  Administered 2016-09-07 – 2016-09-09 (×3): 5 mg via ORAL
  Filled 2016-09-07 (×3): qty 1

## 2016-09-07 MED ORDER — ZOLPIDEM TARTRATE 5 MG PO TABS
10.0000 mg | ORAL_TABLET | Freq: Every evening | ORAL | Status: DC | PRN
  Administered 2016-09-08 (×2): 10 mg via ORAL
  Filled 2016-09-07 (×2): qty 2

## 2016-09-07 MED ORDER — ONDANSETRON HCL 4 MG/2ML IJ SOLN
4.0000 mg | Freq: Four times a day (QID) | INTRAMUSCULAR | Status: DC | PRN
  Administered 2016-09-08: 4 mg via INTRAVENOUS
  Filled 2016-09-07: qty 2

## 2016-09-07 MED ORDER — GI COCKTAIL ~~LOC~~
30.0000 mL | Freq: Once | ORAL | Status: DC
  Filled 2016-09-07: qty 30

## 2016-09-07 MED ORDER — HYDROMORPHONE HCL 2 MG/ML IJ SOLN
1.0000 mg | INTRAMUSCULAR | Status: DC | PRN
  Administered 2016-09-07: 1 mg via INTRAVENOUS
  Filled 2016-09-07: qty 1

## 2016-09-07 MED ORDER — ASPIRIN 81 MG PO CHEW
81.0000 mg | CHEWABLE_TABLET | ORAL | Status: AC
  Administered 2016-09-08: 81 mg via ORAL
  Filled 2016-09-07: qty 1

## 2016-09-07 MED ORDER — EZETIMIBE 10 MG PO TABS
10.0000 mg | ORAL_TABLET | Freq: Every day | ORAL | Status: DC
  Administered 2016-09-07 – 2016-09-09 (×3): 10 mg via ORAL
  Filled 2016-09-07 (×3): qty 1

## 2016-09-07 MED ORDER — ATORVASTATIN CALCIUM 80 MG PO TABS
80.0000 mg | ORAL_TABLET | Freq: Every day | ORAL | Status: DC
  Administered 2016-09-08: 80 mg via ORAL
  Filled 2016-09-07: qty 1

## 2016-09-07 MED ORDER — NITROGLYCERIN 0.4 MG SL SUBL: 0.4000 mg | SUBLINGUAL_TABLET | SUBLINGUAL | Status: DC | PRN

## 2016-09-07 MED ORDER — SODIUM CHLORIDE 0.9 % IV BOLUS (SEPSIS)
500.0000 mL | Freq: Once | INTRAVENOUS | Status: AC
  Administered 2016-09-07: 500 mL via INTRAVENOUS

## 2016-09-07 MED ORDER — ACETAMINOPHEN 325 MG PO TABS
650.0000 mg | ORAL_TABLET | Freq: Once | ORAL | Status: AC
  Administered 2016-09-07: 650 mg via ORAL
  Filled 2016-09-07: qty 2

## 2016-09-07 MED ORDER — SODIUM CHLORIDE 0.9% FLUSH
3.0000 mL | Freq: Two times a day (BID) | INTRAVENOUS | Status: DC
  Administered 2016-09-07 – 2016-09-08 (×2): 3 mL via INTRAVENOUS

## 2016-09-07 MED ORDER — HYDROMORPHONE HCL 1 MG/ML IJ SOLN
1.0000 mg | INTRAMUSCULAR | Status: DC | PRN
  Administered 2016-09-07 – 2016-09-08 (×3): 1 mg via INTRAVENOUS
  Filled 2016-09-07 (×3): qty 1

## 2016-09-07 MED ORDER — CHLORDIAZEPOXIDE HCL 5 MG PO CAPS
10.0000 mg | ORAL_CAPSULE | Freq: Every day | ORAL | Status: DC | PRN
  Administered 2016-09-08: 10 mg via ORAL
  Filled 2016-09-07 (×2): qty 2

## 2016-09-07 MED ORDER — ASPIRIN EC 81 MG PO TBEC: 81.0000 mg | DELAYED_RELEASE_TABLET | Freq: Every day | ORAL | Status: DC

## 2016-09-07 MED ORDER — ASPIRIN EC 81 MG PO TBEC
81.0000 mg | DELAYED_RELEASE_TABLET | Freq: Every day | ORAL | Status: DC
  Administered 2016-09-09: 81 mg via ORAL
  Filled 2016-09-07 (×2): qty 1

## 2016-09-07 MED ORDER — FAMOTIDINE 20 MG PO TABS
20.0000 mg | ORAL_TABLET | Freq: Every day | ORAL | Status: DC
  Administered 2016-09-08 – 2016-09-09 (×2): 20 mg via ORAL
  Filled 2016-09-07 (×2): qty 1

## 2016-09-07 MED ORDER — SODIUM CHLORIDE 0.9 % IV SOLN
250.0000 mL | INTRAVENOUS | Status: DC | PRN
Start: 2016-09-07 — End: 2016-09-08

## 2016-09-07 MED ORDER — HEPARIN SODIUM (PORCINE) 5000 UNIT/ML IJ SOLN
5000.0000 [IU] | Freq: Three times a day (TID) | INTRAMUSCULAR | Status: DC
  Administered 2016-09-07 – 2016-09-09 (×2): 5000 [IU] via SUBCUTANEOUS
  Filled 2016-09-07 (×3): qty 1

## 2016-09-07 MED ORDER — ASPIRIN 81 MG PO CHEW: 324.0000 mg | CHEWABLE_TABLET | Freq: Once | ORAL | Status: DC

## 2016-09-07 MED ORDER — SODIUM CHLORIDE 0.9% FLUSH: 3.0000 mL | INTRAVENOUS | Status: DC | PRN

## 2016-09-07 MED ORDER — CLOPIDOGREL BISULFATE 75 MG PO TABS
75.0000 mg | ORAL_TABLET | Freq: Every day | ORAL | Status: DC
  Administered 2016-09-07 – 2016-09-09 (×3): 75 mg via ORAL
  Filled 2016-09-07 (×3): qty 1

## 2016-09-07 MED ORDER — SODIUM CHLORIDE 0.9 % IV SOLN
INTRAVENOUS | Status: DC
  Administered 2016-09-08: 05:00:00 via INTRAVENOUS

## 2016-09-07 MED ORDER — NITROGLYCERIN 2 % TD OINT
1.0000 [in_us] | TOPICAL_OINTMENT | Freq: Once | TRANSDERMAL | Status: AC
  Administered 2016-09-07: 1 [in_us] via TOPICAL
  Filled 2016-09-07: qty 1

## 2016-09-07 MED ORDER — FLUTICASONE PROPIONATE 50 MCG/ACT NA SUSP
2.0000 | Freq: Every day | NASAL | Status: DC
  Administered 2016-09-08 – 2016-09-09 (×2): 2 via NASAL
  Filled 2016-09-07: qty 16

## 2016-09-07 MED ORDER — ACETAMINOPHEN 325 MG PO TABS
650.0000 mg | ORAL_TABLET | ORAL | Status: DC | PRN
  Administered 2016-09-07 – 2016-09-09 (×6): 650 mg via ORAL
  Filled 2016-09-07 (×6): qty 2

## 2016-09-07 MED ORDER — DULOXETINE HCL 60 MG PO CPEP
60.0000 mg | ORAL_CAPSULE | Freq: Every day | ORAL | Status: DC
  Administered 2016-09-07 – 2016-09-09 (×3): 60 mg via ORAL
  Filled 2016-09-07 (×3): qty 1

## 2016-09-07 NOTE — H&P (Signed)
Cardiology H&P    Patient ID: [name] MRN: [id number], DOB/AGE: 62-Aug-1957   Admit date: 09/07/2016 Date of Consult: 09/07/2016  Primary Physician: [name], MD  Primary Cardiologist: Dr. Irish Lack   Patient Profile    [name] is a 60 year old male with a past medical history of CAD s/p CABG (STEMI s/p POBA of LAD with IABP and subsequent emergent v5 CABG)  in March 2017, ischemic cardiomyopathy, HTN and former tobacco abuse. He presented to the ED on 09/07/16 with chest pain.   History of Present Illness    [name] has a difficult history of CAD. In March of 2017 he was admitted with STEMI and subsequently had emergent 5 vessel CABG. He was admitted again in August 2017 just 5 months later and found to have patent grafts except for the SVG to diagonal which was occluded. He was again admitted in Oct. 2017 with chest pain. He underwent left heart cath and PV angiogram as he had some claudication symptoms. His coronary anatomy was unchanged from the prior cath in Aug. 2017. His peripheral angiogram showed no obstructive disease.   Today he presents with chest pain. He was sleeping last night and he woke up in a cold sweat. He took the trash out and felt particularly short of breath, which is rare for him. He took his BP and the SBP was 195. He then began having chest pain and took a SL Nitro x2 and this helped his pain but the chest pain came right back so he called EMS. He says the chest pain was substernal and went up the right side of his neck. He said the pain was pressure as well as felt like burning. His EKG showed NSR, with no acute changes from his prior EKG.   He denies edema, orthopnea and PND. He says that his toes were blue last night. He has had a PV Angiogram with Dr. [name] in Oct. 2017 that showed no large obstruction in the main vessels.   He denies chest pain currently. Says that his BP has been running higher than usual. BP here in the ED is 134/77.    Past Medical History   Past Medical History:  Diagnosis Date  . Acute hepatitis C without mention of hepatic coma(070.51)    Tx by [facility] for Hep.  negative for hep c 2/15.  Marland Kitchen Anxiety   . CAD (coronary artery disease)    a. 10/2015 ant STEMI >> LHC with 3 v CAD; oLAD tx with POBA >> emergent CABG.  . Chronic systolic CHF (congestive heart failure) ([place])   . Colitis   . Esophageal reflux    eosinophil esophagitis  . Former tobacco use   . Ischemic cardiomyopathy    a. EF 25-30% at intraop TEE 4/17  //  b. Limited Echo 5/17 - EF 45-50%, mild ant HK  . Sinus bradycardia    a. HR dropping into 40s in 02/2016 -> BB reduced.  . Symptomatic hypotension    a. 02/2016 ER visit -> meds reduced.    Past Surgical History:  Procedure Laterality Date  . CARDIAC CATHETERIZATION N/A 11/28/2015   Procedure: Left Heart Cath and Coronary Angiography;  Surgeon: [name], MD;  Location: [place] [facility];  Service: Cardiovascular;  Laterality: N/A;  . CARDIAC CATHETERIZATION N/A 11/28/2015   Procedure: Coronary Balloon Angioplasty;  Surgeon: [name], MD;  Location: [place] [facility];  Service: Cardiovascular;  Laterality: N/A;  ostial  LAD  . CARDIAC CATHETERIZATION N/A 11/28/2015   Procedure: Coronary/Graft Angiography;  Surgeon: [name], MD;  Location: [place] [facility];  Service: Cardiovascular;  Laterality: N/A;  coronaries only   . CARDIAC CATHETERIZATION N/A 04/21/2016   Procedure: Left Heart Cath and Coronary Angiography;  Surgeon: Wellington Hampshire, MD;  Location: [place] [facility];  Service: Cardiovascular;  Laterality: N/A;  . CARDIAC CATHETERIZATION N/A 06/14/2016   Procedure: Left Heart Cath and Cors/Grafts Angiography;  Surgeon: Lorretta Harp, MD;  Location: [place] [facility];  Service: Cardiovascular;  Laterality: N/A;  . CORONARY ARTERY BYPASS GRAFT N/A 11/28/2015   Procedure: CORONARY ARTERY BYPASS GRAFTING (CABG) TIMES FIVE USING LEFT INTERNAL MAMMARY  ARTERY AND RIGHT GREATER SAPHENOUS,VIEN HARVEATED BY ENDOVIEN, INTRAOPPRATIVE TEE;  Surgeon: Gaye Pollack, MD;  Location: [place];  Service: Open Heart Surgery;  Laterality: N/A;  . PERIPHERAL VASCULAR CATHETERIZATION N/A 06/14/2016   Procedure: Lower Extremity Angiography;  Surgeon: Lorretta Harp, MD;  Location: [place] [facility];  Service: Cardiovascular;  Laterality: N/A;  . SHOULDER SURGERY       Allergies  Allergies  Allergen Reactions  . Prednisone Other (See Comments)    States that this med makes him "crazy"  . Tetanus Toxoids Swelling and Other (See Comments)    Fever, Swelling of the arm   . Wellbutrin [Bupropion] Other (See Comments)    Crazy thoughts, nightmares  . Chantix [Varenicline] Other (See Comments)    Dreams    Inpatient Medications    . aspirin  324 mg Oral Once    Family History    Family History  Problem Relation Age of Onset  . Lung cancer Mother   . Heart Problems Father   . Heart attack Father 39  . Stroke Father   . Heart attack Maternal Grandmother   . Stroke Maternal Grandmother   . Heart attack Paternal Uncle   . Hypertension Brother   . Autoimmune disease Neg Hx     Social History    Social History   Social History  . Marital status: Married    Spouse name: [name]  . Number of children: 3  . Years of education: College   Occupational History  . Self-employed Self-Employed   Social History Main Topics  . Smoking status: Former Smoker    Packs/day: 0.50    Types: Cigarettes    Quit date: 11/28/2015  . Smokeless tobacco: Never Used  . Alcohol use No  . Drug use: No  . Sexual activity: Not on file   Other Topics Concern  . Not on file   Social History Narrative   Patient lives at home with his spouse.   Caffeine Use: yes     Review of Systems    General:  No chills, fever, night sweats or weight changes.  Cardiovascular:  + chest pain, dyspnea on exertion, edema, orthopnea, palpitations, paroxysmal nocturnal  dyspnea. Dermatological: No rash, lesions/masses Respiratory: No cough, dyspnea Urologic: No hematuria, dysuria Abdominal:   No nausea, vomiting, diarrhea, bright red blood per rectum, melena, or hematemesis Neurologic:  No visual changes, wkns, changes in mental status. All other systems reviewed and are otherwise negative except as noted above.  Physical Exam    Blood pressure 129/90, pulse 68, temperature 98.3 F (36.8 C), temperature source Oral, resp. rate 13, height '5\' 9"'$  (1.753 m), weight 180 lb (81.6 kg), SpO2 99 %.  General: Pleasant, NAD Psych: Normal affect. Neuro: Alert and oriented X 3.  Moves all extremities spontaneously. HEENT: Normal  Neck: Supple without bruits or JVD. Lungs:  Resp regular and unlabored, CTA. Heart: RRR no s3, s4, or murmurs. Abdomen: Soft, non-tender, non-distended, BS + x 4.  Extremities: No clubbing, cyanosis or edema. DP/PT/Radials 2+ and equal bilaterally.  Labs    Troponin [facility] of Care Test)  Recent Labs  09/07/16 1115  TROPIPOC 0.00    Lab Results  Component Value Date   WBC 7.8 09/07/2016   HGB 14.8 09/07/2016   HCT 45.2 09/07/2016   MCV 92.2 09/07/2016   PLT 195 09/07/2016    Recent Labs Lab 09/07/16 1051  NA 140  K 4.9  CL 105  CO2 28  BUN 19  CREATININE 1.10  CALCIUM 10.0  PROT 7.4  BILITOT 0.6  ALKPHOS 62  ALT 18  AST 18  GLUCOSE 103*   Lab Results  Component Value Date   CHOL 174 04/06/2016   HDL 50 04/06/2016   LDLCALC 109 04/06/2016   TRIG 73 04/06/2016   Lab Results  Component Value Date   DDIMER 0.29 06/10/2016     Radiology Studies    Dg Chest Port 1 View  Result Date: 09/07/2016 CLINICAL DATA:  Chest pain. EXAM: PORTABLE CHEST 1 VIEW COMPARISON:  Radiographs of June 10, 2016. FINDINGS: The heart size and mediastinal contours are within normal limits. Sternotomy wires are noted. No pneumothorax or pleural effusion is noted. Both lungs are clear. The visualized skeletal structures are  unremarkable. IMPRESSION: No acute cardiopulmonary abnormality seen. Electronically Signed   By: Marijo Conception, M.D.   On: 09/07/2016 11:58    EKG & Cardiac Imaging    EKG: NSR  Echocardiogram: 12/30/15 Study Conclusions  - HPI and indications: Limited study. - Left ventricle: The cavity size was normal. Wall thickness was   normal. Systolic function was mildly reduced. The estimated   ejection fraction was in the range of 45% to 50%. Mild anterior   hypokinesis and incoordinate septal motion. The study is not   technically sufficient to allow evaluation of LV diastolic   function.  Impressions:  - Limited study. Compared to the most recent echo, the LVEF has   improved to 45-50% with residual anterior hypokinesis and   incoordinate septal motion.  Assessment & Plan    1. Chest pain with history of CAD: Mr. Derusha has a long history of CAD and presents with chest pain and SOB after taking out the trash. He had moderate CAD in his vein grafts in Oct. 2017. Also had early graft failure in August 2017 when he was found to have an occluded SVG to diagonal.   His troponin is negative, however with his history and known moderate disease in his grafts, it is best to take a look at his anatomy with a heart cath.   The risks and benefits of a cardiac catheterization including, but not limited to, death, stroke, MI, kidney damage and bleeding were discussed with the patient who indicates understanding and agrees to proceed.   He needs a better antianginal regimen. Would add isosorbide '30mg'$  daily.    2. History of CAD s/p CABG: LIMA-LAD, SVG to diagonal (occluded), SVG to ramus and OM, SVG to PDA.   Continue ASA and Plavix.   3. Ischemic cardiomyopathy: Continue ACE-I. Euvolemic today.   4. HLD: Continue high intensity statin.     Signed, [name], NP 09/07/2016, 1:29 PM Pager: [phone]  I have personally seen and examined this patient  with [name], NP. I agree  with the assessment and plan as outlined above. He is known to have CAD s/p 4V CABG in March 2017 with early graft failure to the Diagonal and moderate disease in the vein graft to the RCA and vein graft to the OM in October 2017. He now has chest pain concerning for unstable angina. EKG without ischemic changes. Troponin POC negative x 1. Exam shows a thin WM in NAD, CV:RRR. Pulm: CTA bilaterally. Abd: soft, NT. Ext: no edema.  We will admit to telemetry, cycle cardiac markers. Plan cardiac cath in the am to exclude progression of CAD. Most concerning is the moderate disease in the remaining vein grafts in October and now abrupt change in symptoms.   Lauree Chandler 09/07/2016 2:16 PM

## 2016-09-07 NOTE — ED Notes (Signed)
Pt given ginger ale with ice per Whitney(RN)

## 2016-09-07 NOTE — ED Notes (Signed)
Ambulated pt to bathroom, no issues. Pt placed back on monitor.

## 2016-09-07 NOTE — ED Provider Notes (Signed)
Medical screening examination/treatment/procedure(s) were conducted as a shared visit with non-physician practitioner(s) and myself.  I personally evaluated the patient during the encounter.   EKG Interpretation  Date/Time:  Tuesday September 07 2016 10:18:52 EST Ventricular Rate:  79 PR Interval:    QRS Duration: 88 QT Interval:  352 QTC Calculation: 404 R Axis:   81 Text Interpretation:  Sinus rhythm Borderline right axis deviation Low voltage, precordial leads Borderline T wave abnormalities No significant change since last tracing Confirmed by Murriel Holwerda MD, Kennen Stammer [phone]) on 09/07/2016 10:25:16 AM      61 year old male who presents with chest pain. History of CAD s/p CABG, HTN, HLD on plavix. Recent diarrheal illness, but feeling better over past 1-2 days. Noticed yesterday, while going up steps from basement to first floor was a little winded, which is unusual for him. Today, taking trash out from home and upon getting home felt winded, was hypertensive 190s/100s, and with retrosternal chest pain. Came to ED for evaluation. No recent fever or chills, cough, n/v, abd pain, back pain, syncope or near syncope.  Well appearing in ED. Stable vital signs. Exam non-focal. With mild residual chest pain. Received aspirin and nitro. EKG w/o acute ischemic changes. Troponin x 1 negative. CXR visualized and shows no acute cardiopulmonary processes  On chart review, LHC in 05/2016 and 03/2016 showing significant CAD but no large vessel disease requiring stent or intervention. Given concerning history, will plan to admit for observation for ongoing cardiac work-up   Forde Dandy, MD 09/07/16 1202

## 2016-09-07 NOTE — ED Provider Notes (Signed)
[facility] DEPT Provider Note   CSN: [id number] Arrival date & time: 09/07/16  1006     History   Chief Complaint Chief Complaint  Patient presents with  . Chest Pain     HPI   Blood pressure 128/73, pulse 72, temperature 98.3 F (36.8 C), temperature source Oral, resp. rate 20, height '5\' 9"'$  (1.753 m), weight 81.6 kg, SpO2 100 %.  [name] is a 61 y.o. male brought in by EMS: vasculopath with history of STEMI with CABG, PAD requiring bypass grafting, former smoker, hypertension, hyperlipidemia complaining of retrosternal chest pain onset this morning around 7 AM when he was taking out the trash with associated shortness of breath and slight confusion. BP was elevated at 190/>100. States initially the pain was 7 out of 10, described as pressure-like and sharp, it radiated to the left side of the chest and to the left neck. He states that he took 2 sublingual nitroglycerin with some relief but then the pain increased back to 7 out of 10. He tried Gaviscon this morning because he has severe reflux but that did not relieve the pain.There was no associated diaphoresis or lightheadedness. He does endorse nausea on review of systems. He states this feels like prior heart attack but much less severe. He states that he's been getting over a viral illness with nausea vomiting diarrhea, states he woke up sweating this morning. He is compliant with his Plavix which she took last night. Patient denies increasing peripheral edema, cough, fever, chills, abdominal pain.  Cards: [place] Lack   Past Medical History:  Diagnosis Date  . Acute hepatitis C without mention of hepatic coma(070.51)    Tx by [facility] for Hep.  negative for hep c 2/15.  Marland Kitchen Anxiety   . CAD (coronary artery disease)    a. 10/2015 ant STEMI >> LHC with 3 v CAD; oLAD tx with POBA >> emergent CABG.  . Chronic systolic CHF (congestive heart failure) ([place])   . Colitis   . Esophageal reflux    eosinophil esophagitis  . Former  tobacco use   . Ischemic cardiomyopathy    a. EF 25-30% at intraop TEE 4/17  //  b. Limited Echo 5/17 - EF 45-50%, mild ant HK  . Sinus bradycardia    a. HR dropping into 40s in 02/2016 -> BB reduced.  . Symptomatic hypotension    a. 02/2016 ER visit -> meds reduced.    Patient Active Problem List   Diagnosis Date Noted  . Claudication of both lower extremities ([place])   . Pure hypercholesterolemia   . Tobacco abuse disorder   . Coronary artery disease involving coronary bypass graft of native heart with unstable angina pectoris ([place])   . Chest pain 06/10/2016  . Abnormal nuclear stress test - HIGH RISK 04/20/2016  . ST elevation myocardial infarction (STEMI) of anterior wall, subsequent episode of care ([place])   . Essential hypertension 02/26/2016  . Ischemic cardiomyopathy 12/25/2015  . Dyslipidemia, goal LDL below 70 12/25/2015  . STEMI (ST elevation myocardial infarction) ([place]) 11/28/2015  . 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 Coronary artery disease involving native coronary artery 11/28/2015  . 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 Acute MI anterior wall first episode care [facility])   . Chest pain with high risk for cardiac etiology 03/07/2015  . Dysphagia 03/07/2015  . Gout attack 03/07/2015  . Mixed bipolar I disorder ([place]) 03/07/2015  . Fibromyalgia 07/09/2014  . Gout 07/09/2014  . Anxiety 07/09/2014  .  Depression 07/09/2014  . Hepatitis C 11/20/2012  . Eosinophilic esophagitis 40/98/1191    Past Surgical History:  Procedure Laterality Date  . CARDIAC CATHETERIZATION N/A 11/28/2015   Procedure: Left Heart Cath and Coronary Angiography;  Surgeon: [name], MD;  Location: [place] CV LAB;  Service: Cardiovascular;  Laterality: N/A;  . CARDIAC CATHETERIZATION N/A 11/28/2015   Procedure: Coronary Balloon Angioplasty;  Surgeon: [name], MD;  Location: [place] [facility] LAB;  Service: Cardiovascular;  Laterality: N/A;  ostial LAD  . CARDIAC  CATHETERIZATION N/A 11/28/2015   Procedure: Coronary/Graft Angiography;  Surgeon: [name], MD;  Location: [place] [facility] LAB;  Service: Cardiovascular;  Laterality: N/A;  coronaries only   . CARDIAC CATHETERIZATION N/A 04/21/2016   Procedure: Left Heart Cath and Coronary Angiography;  Surgeon: Wellington Hampshire, MD;  Location: [place] [facility] LAB;  Service: Cardiovascular;  Laterality: N/A;  . CARDIAC CATHETERIZATION N/A 06/14/2016   Procedure: Left Heart Cath and Cors/Grafts Angiography;  Surgeon: Lorretta Harp, MD;  Location: [place] CV LAB;  Service: Cardiovascular;  Laterality: N/A;  . CORONARY ARTERY BYPASS GRAFT N/A 11/28/2015   Procedure: CORONARY ARTERY BYPASS GRAFTING (CABG) TIMES FIVE USING LEFT INTERNAL MAMMARY ARTERY AND RIGHT GREATER SAPHENOUS,VIEN HARVEATED BY ENDOVIEN, INTRAOPPRATIVE TEE;  Surgeon: Gaye Pollack, MD;  Location: [facility];  Service: Open Heart Surgery;  Laterality: N/A;  . PERIPHERAL VASCULAR CATHETERIZATION N/A 06/14/2016   Procedure: Lower Extremity Angiography;  Surgeon: Lorretta Harp, MD;  Location: [place] [facility] LAB;  Service: Cardiovascular;  Laterality: N/A;  . SHOULDER SURGERY         Home Medications    Prior to Admission medications   Medication Sig Start Date End Date Taking? Authorizing Provider  aluminum hydroxide-magnesium carbonate (GAVISCON) 95-358 MG/15ML SUSP Take 30 mLs by mouth as needed for indigestion or heartburn.   Yes Historical Provider, MD  aspirin 81 MG tablet Take 81 mg by mouth daily.   Yes Historical Provider, MD  atorvastatin (LIPITOR) 40 MG tablet Take 1 tablet (40 mg total) by mouth daily. Patient taking differently: Take 80 mg by mouth daily.  08/12/16 11/10/16 Yes [name], MD  chlordiazePOXIDE (LIBRIUM) 10 MG capsule Take 10 mg by mouth daily as needed for anxiety.    Yes Historical Provider, MD  clopidogrel (PLAVIX) 75 MG tablet Take 75 mg by mouth daily.   Yes Historical Provider, MD  DULoxetine  (CYMBALTA) 60 MG capsule Take 60 mg by mouth daily.  07/07/16  Yes Historical Provider, MD  ezetimibe (ZETIA) 10 MG tablet Take 1 tablet (10 mg total) by mouth daily. 08/12/16 11/10/16 Yes [name], MD  fluticasone (FLONASE) 50 MCG/ACT nasal spray Place 2 sprays into both nostrils daily.   Yes Historical Provider, MD  lisinopril (PRINIVIL,ZESTRIL) 5 MG tablet Take 1 tablet daily. May take additional tablet once daily for SBP greater than 150 one hour after taking daily dose. 09/01/16  Yes [name], MD  Multiple Vitamins-Minerals (ONE-A-DAY MENS 50+ ADVANTAGE PO) Take 1 tablet by mouth daily.    Yes Historical Provider, MD  nitroGLYCERIN (NITROSTAT) 0.4 MG SL tablet Place 0.4 mg under the tongue every 5 (five) minutes as needed for chest pain. 3 DOSE MAX   Yes Historical Provider, MD  ranitidine (ZANTAC) 150 MG tablet Take 150 mg by mouth daily as needed for heartburn.    Yes Historical Provider, MD  zolpidem (AMBIEN) 10 MG tablet Take 10 mg by mouth at bedtime  as needed for sleep. Reported on 02/09/2016 11/26/15  Yes Historical Provider, MD  Fexofenadine HCl (MUCINEX ALLERGY PO) Take 1 tablet by mouth daily.    Historical Provider, MD  pantoprazole (PROTONIX) 40 MG tablet Take 40 mg by mouth daily as needed (FOR REFLUX). FOR REFLUX    Historical Provider, MD    Family History Family History  Problem Relation Age of Onset  . Lung cancer Mother   . Heart Problems Father   . Heart attack Father 95  . Stroke Father   . Heart attack Maternal Grandmother   . Stroke Maternal Grandmother   . Heart attack Paternal Uncle   . Hypertension Brother   . Autoimmune disease Neg Hx     Social History Social History  Substance Use Topics  . Smoking status: Former Smoker    Packs/day: 0.50    Types: Cigarettes    Quit date: 11/28/2015  . Smokeless tobacco: Never Used  . Alcohol use No     Allergies   Prednisone; Tetanus toxoids; Wellbutrin [bupropion]; and Chantix  [varenicline]   Review of Systems Review of Systems  10 systems reviewed and found to be negative, except as noted in the HPI.   Physical Exam Updated Vital Signs BP 129/90   Pulse 68   Temp 98.3 F (36.8 C) (Oral)   Resp 13   Ht '5\' 9"'$  (1.753 m)   Wt 81.6 kg   SpO2 99%   BMI 26.58 kg/m   Physical Exam  Constitutional: He is oriented to person, place, and time. He appears well-developed and well-nourished. No distress.  HENT:  Head: Normocephalic and atraumatic.  Mouth/Throat: Oropharynx is clear and moist.  Eyes: Conjunctivae and EOM are normal. Pupils are equal, round, and reactive to light.  Neck: Normal range of motion. No JVD present. No tracheal deviation present.  Cardiovascular: Normal rate, regular rhythm and intact distal pulses.   Radial pulse equal bilaterally  Pulmonary/Chest: Effort normal and breath sounds normal. No stridor. No respiratory distress. He has no wheezes. He has no rales. He exhibits no tenderness.  Abdominal: Soft. He exhibits no distension and no mass. There is no tenderness. There is no rebound and no guarding.  Musculoskeletal: Normal range of motion. He exhibits no edema or tenderness.  No calf asymmetry, superficial collaterals, palpable cords, edema, Homans sign negative bilaterally.    Neurological: He is alert and oriented to person, place, and time.  Skin: Skin is warm. He is not diaphoretic.  Psychiatric: He has a normal mood and affect.  Nursing note and vitals reviewed.    ED Treatments / Results  Labs (all labs ordered are listed, but only abnormal results are displayed) Labs Reviewed  COMPREHENSIVE METABOLIC PANEL - Abnormal; Notable for the following:       Result Value   Glucose, Bld 103 (*)    All other components within normal limits  CBC WITH DIFFERENTIAL/PLATELET  Randolm Idol, ED    EKG  EKG Interpretation  Date/Time:  Tuesday September 07 2016 10:18:52 EST Ventricular Rate:  79 PR Interval:    QRS  Duration: 88 QT Interval:  352 QTC Calculation: 404 R Axis:   81 Text Interpretation:  Sinus rhythm Borderline right axis deviation Low voltage, precordial leads Borderline T wave abnormalities No significant change since last tracing Confirmed by LIU MD, DANA [id number]) on 09/07/2016 10:25:27 AM       Radiology Dg Chest Port 1 View  Result Date: 09/07/2016 CLINICAL DATA:  Chest pain. EXAM: PORTABLE  CHEST 1 VIEW COMPARISON:  Radiographs of June 10, 2016. FINDINGS: The heart size and mediastinal contours are within normal limits. Sternotomy wires are noted. No pneumothorax or pleural effusion is noted. Both lungs are clear. The visualized skeletal structures are unremarkable. IMPRESSION: No acute cardiopulmonary abnormality seen. Electronically Signed   By: Marijo Conception, M.D.   On: 09/07/2016 11:58    Procedures Procedures (including critical care time)  Medications Ordered in ED Medications  aspirin chewable tablet 324 mg (324 mg Oral Not Given 09/07/16 1058)  gi cocktail (Maalox,Lidocaine,Donnatal) (not administered)  nitroGLYCERIN (NITROGLYN) 2 % ointment 1 inch (1 inch Topical Given 09/07/16 1118)  acetaminophen (TYLENOL) tablet 650 mg (650 mg Oral Given 09/07/16 1243)  sodium chloride 0.9 % bolus 500 mL (500 mLs Intravenous New Bag/Given 09/07/16 1244)  HYDROmorphone (DILAUDID) injection 0.5 mg (0.5 mg Intravenous Given 09/07/16 1243)     Initial Impression / Assessment and Plan / ED Course  I have reviewed the triage vital signs and the nursing notes.  Pertinent labs & imaging results that were available during my care of the patient were reviewed by me and considered in my medical decision making (see chart for details).  Clinical Course     Vitals:   09/07/16 1115 09/07/16 1130 09/07/16 1145 09/07/16 1200  BP: 121/77 117/81 113/79 129/90  Pulse: 74 71 74 68  Resp: '14 13 12 13  '$ Temp:      TempSrc:      SpO2: 99% 98% 100% 99%  Weight:      Height:        Medications   aspirin chewable tablet 324 mg (324 mg Oral Not Given 09/07/16 1058)  gi cocktail (Maalox,Lidocaine,Donnatal) (not administered)  nitroGLYCERIN (NITROGLYN) 2 % ointment 1 inch (1 inch Topical Given 09/07/16 1118)  acetaminophen (TYLENOL) tablet 650 mg (650 mg Oral Given 09/07/16 1243)  sodium chloride 0.9 % bolus 500 mL (500 mLs Intravenous New Bag/Given 09/07/16 1244)  HYDROmorphone (DILAUDID) injection 0.5 mg (0.5 mg Intravenous Given 09/07/16 1243)    [name] is 61 y.o. male with extensive cardiac history, vasculopath complaining of retrosternal chest pain radiating to the left chest and left neck onset proximally 7 AM while he was taking out the trash, associated symptoms of shortness of breath and nausea. Improved with nitroglycerin. Patient had cath in 8 of 2017 and 10 of 2017 with stable disease, no stents.  EKG with no acute findings, troponin negative, blood work unremarkable and chest x-ray normal. Pain is been very difficult to control with multiple doses of sublingual nitroglycerin and morphine given by EMS with little relief, he's received Nitropaste in the ED with little relief. He will need admission  Discussed with triad hospitalist APP who states that Dr. Marily Memos requests that cardiology be consulted for admission.  Attending Dr. Oleta Mouse will further discuss with Dr. Marily Memos.  Discussed with Dr. Yvetta Coder of cardiology who said they may admit that they will have to be evaluated, she will call Trish to get a team assigned.  Chest pain improved with Dilaudid, initially completely resolved the pain however now it is 2 out of 10, will give GI cocktail. Eyes patient to please inform the nurse if he has any new symptoms or worsening chest pain.  Cards to admit  Final Clinical Impressions(s) / ED Diagnoses   Final diagnoses:  Chest pain, unspecified type    New Prescriptions New Prescriptions   No medications on file     Monico Blitz, PA-C 09/07/16 1537  Forde Dandy,  MD 09/07/16 1726

## 2016-09-07 NOTE — ED Triage Notes (Signed)
Pt brought in by EMS due to having chest pain this am. Pt was taking trash out and started having SOB and chest pain. Per EMS pt checked BP and it was hypertensive. Pt has hx of CABG. Pt received '6mg'$  of morphine, nitrox3, and '324mg'$  of aspirin. Pt a&ox4.

## 2016-09-07 NOTE — Progress Notes (Signed)
Pt encourage to watch CCTV. Cath consent signed.

## 2016-09-08 ENCOUNTER — Encounter (HOSPITAL_COMMUNITY): Payer: Self-pay | Admitting: General Practice

## 2016-09-08 ENCOUNTER — Encounter (HOSPITAL_COMMUNITY)
Admission: EM | Disposition: A | Discharge status: Home / Self Care | Payer: Self-pay | Source: Home / Self Care | Attending: Cardiovascular Disease

## 2016-09-08 HISTORY — PX: CARDIAC CATHETERIZATION: SHX172

## 2016-09-08 LAB — CBC
HCT: 44.6 % (ref 39.0–52.0)
Hemoglobin: 14.6 g/dL (ref 13.0–17.0)
MCH: 30.6 pg (ref 26.0–34.0)
MCHC: 32.7 g/dL (ref 30.0–36.0)
MCV: 93.5 fL (ref 78.0–100.0)
Platelets: 162 10*3/uL (ref 150–400)
RBC: 4.77 MIL/uL (ref 4.22–5.81)
RDW: 13.2 % (ref 11.5–15.5)
WBC: 7.7 10*3/uL (ref 4.0–10.5)

## 2016-09-08 LAB — PROTIME-INR
INR: 0.99
Prothrombin Time: 13.1 seconds (ref 11.4–15.2)

## 2016-09-08 LAB — BASIC METABOLIC PANEL
Anion gap: 10 (ref 5–15)
BUN: 18 mg/dL (ref 6–20)
CO2: 27 mmol/L (ref 22–32)
Calcium: 9.8 mg/dL (ref 8.9–10.3)
Chloride: 104 mmol/L (ref 101–111)
Creatinine, Ser: 1.04 mg/dL (ref 0.61–1.24)
GFR calc Af Amer: >60 mL/min (ref >60)
GFR calc non Af Amer: >60 mL/min (ref >60)
Glucose, Bld: 90 mg/dL (ref 65–99)
Potassium: 4.9 mmol/L (ref 3.5–5.1)
Sodium: 141 mmol/L (ref 135–145)

## 2016-09-08 LAB — HEMOGLOBIN A1C
Hgb A1c MFr Bld: 5.1 % (ref 4.8–5.6)
Mean Plasma Glucose: 100 mg/dL

## 2016-09-08 SURGERY — LEFT HEART CATH AND CORS/GRAFTS ANGIOGRAPHY
Anesthesia: LOCAL

## 2016-09-08 MED ORDER — HEPARIN SODIUM (PORCINE) 1000 UNIT/ML IJ SOLN
INTRAMUSCULAR | Status: DC | PRN
  Administered 2016-09-08: 4000 [IU] via INTRAVENOUS

## 2016-09-08 MED ORDER — FENTANYL CITRATE (PF) 100 MCG/2ML IJ SOLN
INTRAMUSCULAR | Status: AC
  Filled 2016-09-08: qty 2

## 2016-09-08 MED ORDER — SODIUM CHLORIDE 0.9% FLUSH
3.0000 mL | Freq: Two times a day (BID) | INTRAVENOUS | Status: DC
Start: 2016-09-08 — End: 2016-09-09
  Administered 2016-09-08 – 2016-09-09 (×2): 3 mL via INTRAVENOUS

## 2016-09-08 MED ORDER — LIDOCAINE HCL (PF) 1 % IJ SOLN
INTRAMUSCULAR | Status: AC
  Filled 2016-09-08: qty 30

## 2016-09-08 MED ORDER — MIDAZOLAM HCL 2 MG/2ML IJ SOLN
INTRAMUSCULAR | Status: AC
  Filled 2016-09-08: qty 2

## 2016-09-08 MED ORDER — FENTANYL CITRATE (PF) 100 MCG/2ML IJ SOLN
INTRAMUSCULAR | Status: DC | PRN
  Administered 2016-09-08: 50 ug via INTRAVENOUS

## 2016-09-08 MED ORDER — VERAPAMIL HCL 2.5 MG/ML IV SOLN
INTRAVENOUS | Status: DC | PRN
  Administered 2016-09-08: 8 mL via INTRA_ARTERIAL

## 2016-09-08 MED ORDER — IOPAMIDOL (ISOVUE-370) INJECTION 76%
INTRAVENOUS | Status: DC | PRN
  Administered 2016-09-08: 115 mL via INTRA_ARTERIAL

## 2016-09-08 MED ORDER — SODIUM CHLORIDE 0.9 % IV SOLN: 250.0000 mL | INTRAVENOUS | Status: DC | PRN

## 2016-09-08 MED ORDER — LIDOCAINE HCL (PF) 1 % IJ SOLN
INTRAMUSCULAR | Status: DC | PRN
  Administered 2016-09-08: 2 mL

## 2016-09-08 MED ORDER — HEPARIN (PORCINE) IN NACL 2-0.9 UNIT/ML-% IJ SOLN
INTRAMUSCULAR | Status: AC
  Filled 2016-09-08: qty 1000

## 2016-09-08 MED ORDER — SODIUM CHLORIDE 0.9 % IV SOLN
INTRAVENOUS | Status: AC
  Administered 2016-09-08: 15:00:00 via INTRAVENOUS

## 2016-09-08 MED ORDER — ISOSORBIDE MONONITRATE ER 30 MG PO TB24
30.0000 mg | ORAL_TABLET | Freq: Every day | ORAL | Status: DC
  Administered 2016-09-08 – 2016-09-09 (×2): 30 mg via ORAL
  Filled 2016-09-08 (×2): qty 1

## 2016-09-08 MED ORDER — HEPARIN (PORCINE) IN NACL 2-0.9 UNIT/ML-% IJ SOLN
INTRAMUSCULAR | Status: DC | PRN
  Administered 2016-09-08: 1000 mL

## 2016-09-08 MED ORDER — VERAPAMIL HCL 2.5 MG/ML IV SOLN
INTRAVENOUS | Status: AC
  Filled 2016-09-08: qty 2

## 2016-09-08 MED ORDER — HEPARIN SODIUM (PORCINE) 1000 UNIT/ML IJ SOLN
INTRAMUSCULAR | Status: AC
  Filled 2016-09-08: qty 1

## 2016-09-08 MED ORDER — SODIUM CHLORIDE 0.9% FLUSH: 3.0000 mL | INTRAVENOUS | Status: DC | PRN

## 2016-09-08 MED ORDER — METOPROLOL TARTRATE 25 MG PO TABS
25.0000 mg | ORAL_TABLET | Freq: Two times a day (BID) | ORAL | Status: DC
  Administered 2016-09-08 – 2016-09-09 (×2): 25 mg via ORAL
  Filled 2016-09-08 (×3): qty 1

## 2016-09-08 MED ORDER — NITROGLYCERIN 1 MG/10 ML FOR IR/CATH LAB
INTRA_ARTERIAL | Status: AC
  Filled 2016-09-08: qty 10

## 2016-09-08 MED ORDER — IOPAMIDOL (ISOVUE-370) INJECTION 76%
INTRAVENOUS | Status: AC
  Filled 2016-09-08: qty 125

## 2016-09-08 MED ORDER — MIDAZOLAM HCL 2 MG/2ML IJ SOLN
INTRAMUSCULAR | Status: DC | PRN
  Administered 2016-09-08: 2 mg via INTRAVENOUS

## 2016-09-08 SURGICAL SUPPLY — 12 items
CATH INFINITI 5FR JL4 (CATHETERS) ×2 IMPLANT
CATH INFINITI JR4 5F (CATHETERS) ×2 IMPLANT
CATH OPTITORQUE TIG 4.0 5F (CATHETERS) ×2 IMPLANT
DEVICE RAD COMP TR BAND LRG (VASCULAR PRODUCTS) ×2 IMPLANT
ELECT DEFIB PAD ADLT CADENCE (PAD) ×2 IMPLANT
GLIDESHEATH SLEND SS 6F .021 (SHEATH) ×2 IMPLANT
GUIDEWIRE INQWIRE 1.5J.035X260 (WIRE) ×1 IMPLANT
INQWIRE 1.5J .035X260CM (WIRE) ×2
KIT HEART LEFT (KITS) ×2 IMPLANT
PACK CARDIAC CATHETERIZATION (CUSTOM PROCEDURE TRAY) ×2 IMPLANT
TRANSDUCER W/STOPCOCK (MISCELLANEOUS) ×2 IMPLANT
TUBING CIL FLEX 10 FLL-RA (TUBING) ×2 IMPLANT

## 2016-09-08 NOTE — Progress Notes (Signed)
Offered nitro for chest pain. Pt declined, prefers dilaudid.

## 2016-09-08 NOTE — Interval H&P Note (Signed)
Cath Lab Visit (complete for each Cath Lab visit)  Clinical Evaluation Leading to the Procedure:   ACS: Yes.    Non-ACS:     Prior CABG: Previous CABG      History and Physical Interval Note:  09/08/2016 10:38 AM  [name]  has presented today for surgery, with the diagnosis of Chest Pain  The various methods of treatment have been discussed with the patient and family. After consideration of risks, benefits and other options for treatment, the patient has consented to  Procedure(s): Left Heart Cath and Cors/Grafts Angiography (N/A) as a surgical intervention .  The patient's history has been reviewed, patient examined, no change in status, stable for surgery.  I have reviewed the patient's chart and labs.  Questions were answered to the patient's satisfaction.     [name]

## 2016-09-08 NOTE — Progress Notes (Signed)
Cath today showed no change in anatomy  LVEDP increased at 19  Note plans to increase metorprolol Reviewed with pt and wife findings  Pt anxious  Multiple questions (what can I do; how long can I live with this)    For now recomm adding meds  Follow HR and BP Will need close outpt f/u in clinic   ? Cardiac rehab to follow response to walking May d/c later today or in AM   [facility]

## 2016-09-08 NOTE — Progress Notes (Signed)
Patient Name: [name] Date of Encounter: 09/08/2016  Primary Cardiologist: Dr. Marinda Elk Problem List     Active Problems:   Unstable angina [name])     Subjective   Still with chest pain overnight. Denies SOB.   Inpatient Medications    Scheduled Meds: . aspirin  324 mg Oral Once  . aspirin EC  81 mg Oral Daily  . atorvastatin  80 mg Oral q1800  . clopidogrel  75 mg Oral Daily  . DULoxetine  60 mg Oral Daily  . ezetimibe  10 mg Oral Daily  . famotidine  20 mg Oral Daily  . fluticasone  2 spray Each Nare Daily  . gi cocktail  30 mL Oral Once  . heparin  5,000 Units Subcutaneous Q8H  . lisinopril  5 mg Oral Daily  . sodium chloride flush  3 mL Intravenous Q12H   Continuous Infusions: . sodium chloride 100 mL/hr at 09/08/16 0523   PRN Meds: sodium chloride, acetaminophen, chlordiazePOXIDE, HYDROmorphone (DILAUDID) injection, nitroGLYCERIN, ondansetron (ZOFRAN) IV, sodium chloride flush, zolpidem   Vital Signs    Vitals:   09/07/16 1823 09/07/16 2036 09/08/16 0500 09/08/16 0824  BP: (!) 148/77 126/83 128/78 (!) 121/91  Pulse: (!) 54 67 60   Resp: '14 18 18   '$ Temp:  98 F (36.7 C) 97.7 F (36.5 C)   TempSrc:  Oral Oral   SpO2: 99% 100% 99%   Weight: 180 lb (81.6 kg)  178 lb 8 oz (81 kg)   Height: '5\' 9"'$  (1.753 m)       Intake/Output Summary (Last 24 hours) at 09/08/16 0832 Last data filed at 09/07/16 2200  Gross per 24 hour  Intake              740 ml  Output                0 ml  Net              740 ml   Filed Weights   09/07/16 1018 09/07/16 1823 09/08/16 0500  Weight: 180 lb (81.6 kg) 180 lb (81.6 kg) 178 lb 8 oz (81 kg)    Physical Exam   GEN: Well nourished, well developed, in no acute distress.  HEENT: Grossly normal.  Neck: Supple, no JVD, carotid bruits, or masses. Cardiac: RRR, no murmurs, rubs, or gallops. No clubbing, cyanosis, edema.  Radials/DP/PT 2+ and equal bilaterally.  Respiratory:  Respirations regular and  unlabored, clear to auscultation bilaterally. GI: Soft, nontender, nondistended, BS + x 4. MS: no deformity or atrophy. Skin: warm and dry, no rash. Neuro:  Strength and sensation are intact. Psych: AAOx3.  Normal affect.  Labs    CBC  Recent Labs  09/07/16 1051 09/08/16 0549  WBC 7.8 7.7  NEUTROABS 5.7  --   HGB 14.8 14.6  HCT 45.2 44.6  MCV 92.2 93.5  PLT 195 952   Basic Metabolic Panel  Recent Labs  09/07/16 1051 09/08/16 0549  NA 140 141  K 4.9 4.9  CL 105 104  CO2 28 27  GLUCOSE 103* 90  BUN 19 18  CREATININE 1.10 1.04  CALCIUM 10.0 9.8   Liver Function Tests  Recent Labs  09/07/16 1051  AST 18  ALT 18  ALKPHOS 62  BILITOT 0.6  PROT 7.4  ALBUMIN 4.4   Hemoglobin A1C  Recent Labs  09/07/16 1942  HGBA1C 5.1     Telemetry    NSR - Personally Reviewed  ECG    NSR, nonspecific ST changes in the anterior leads consistent with prior EKG's - Personally Reviewed  Radiology    Dg Chest Port 1 View  Result Date: 09/07/2016 CLINICAL DATA:  Chest pain. EXAM: PORTABLE CHEST 1 VIEW COMPARISON:  Radiographs of June 10, 2016. FINDINGS: The heart size and mediastinal contours are within normal limits. Sternotomy wires are noted. No pneumothorax or pleural effusion is noted. Both lungs are clear. The visualized skeletal structures are unremarkable. IMPRESSION: No acute cardiopulmonary abnormality seen. Electronically Signed   By: Marijo Conception, M.D.   On: 09/07/2016 11:58      Patient Profile     [name] is a 61 year old male with a past medical history of CAD s/p CABG (STEMI s/p POBA of LAD with IABP and subsequent emergent v5 CABG)  in March 2017, ischemic cardiomyopathy, HTN and former tobacco abuse. He presented to the ED on 09/07/16 with chest pain. For cath today.   Assessment & Plan    1. Chest pain with history of CAD: [name] has a long history of CAD and presents with chest pain and SOB after taking out the trash. He had moderate  CAD in his vein grafts in Oct. 2017. Also had early graft failure in August 2017 when he was found to have an occluded SVG to diagonal.   His troponin is negative, however with his history and known moderate disease in his grafts, it is best to take a look at his anatomy with a heart cath.   The risks and benefits of a cardiac catheterization including, but not limited to, death, stroke, MI, kidney damage and bleeding were discussed with the patient who indicates understanding and agrees to proceed.   He needs a better antianginal regimen. Would add isosorbide '30mg'$  daily.    2. History of CAD s/p CABG: LIMA-LAD, SVG to diagonal (occluded), SVG to ramus and OM, SVG to PDA.   Continue ASA and Plavix.   3. Ischemic cardiomyopathy: Continue ACE-I. Euvolemic today.   4. HLD: Continue high intensity statin  Signed, [name], NP  09/08/2016, 8:32 AM   Pt seen and examined.  Agree with findnigs as noted above by Christain Sacramento. See following note.  Dorris Carnes

## 2016-09-09 DIAGNOSIS — I200 Unstable angina: Secondary | ICD-10-CM

## 2016-09-09 MED ORDER — ISOSORBIDE MONONITRATE ER 30 MG PO TB24: 30.0000 mg | ORAL_TABLET | Freq: Every day | ORAL | 12 refills | Status: DC

## 2016-09-09 MED ORDER — ONDANSETRON 8 MG PO TBDP
8.0000 mg | ORAL_TABLET | Freq: Three times a day (TID) | ORAL | Status: DC | PRN
  Administered 2016-09-09: 8 mg via ORAL
  Filled 2016-09-09 (×3): qty 1

## 2016-09-09 MED ORDER — METOPROLOL TARTRATE 25 MG PO TABS: 12.5000 mg | ORAL_TABLET | Freq: Two times a day (BID) | ORAL | 12 refills | Status: DC

## 2016-09-09 MED ORDER — PERFLUTREN LIPID MICROSPHERE
INTRAVENOUS | Status: AC
  Filled 2016-09-09: qty 10

## 2016-09-09 NOTE — Discharge Summary (Signed)
Discharge Summary    Patient ID: [name],  MRN: [id number], DOB/AGE: 1956-05-02 61 y.o.  Admit date: 09/07/2016 Discharge date: 09/09/2016  Primary Care Provider: [name] Primary Cardiologist: Dr. Irish Lack   Discharge Diagnoses    Active Problems:   Unstable angina ([place])   Allergies Allergies  Allergen Reactions  . Prednisone Other (See Comments)    States that this med makes him "crazy"  . Tetanus Toxoids Swelling and Other (See Comments)    Fever, Swelling of the arm   . Wellbutrin [Bupropion] Other (See Comments)    Crazy thoughts, nightmares  . Chantix [Varenicline] Other (See Comments)    Dreams    Diagnostic Studies/Procedures  Left Heart Cath and Cors/Grafts Angiography 09/08/16  The left ventricular systolic function is normal.  LV end diastolic pressure is mildly elevated.  The left ventricular ejection fraction is 50-55% by visual estimate.  Ost LAD to Prox LAD lesion, 75 %stenosed.  Ost Ramus lesion, 90 %stenosed.  Ramus lesion, 75 %stenosed.  Ost RCA to Prox RCA lesion, 50 %stenosed.  RPDA lesion, 40 %stenosed.  Origin to Mid Graft lesion, 60 %stenosed.  Origin to Mid Graft lesion, 50 %stenosed.  SVG.  Origin to Prox Graft lesion, 100 %stenosed.  Origin to Prox Graft lesion, 70 %stenosed.  1st Diag lesion, 60 %stenosed.  Ost LM lesion, 60 %stenosed.  Ost Cx lesion, 70 %stenosed.   1. Significant underlying three-vessel coronary artery disease with patent grafts except for SVG to diagonal which is known to be occluded from before. Stable moderate disease in SVG to OM. There is mild progression of disease of proximal SVG to right PDA which might be hemodynamically significant. However, the native right coronary artery has only moderate nonobstructive disease. Thus, no indication for revascularization. There is more antegrade flow going down the native left coronary system due to what seems to be improvement in left main  disease and ostial LAD and left circumflex disease.  2. Low normal LV systolic function with an EF of 50%. Mildly elevated left ventricular end-diastolic pressure.   Diagnostic Diagram       _____________   History of Present Illness    [name] is a 61 year old male with a past medical history of CAD s/p CABG (STEMI s/p POBA of LAD with IABP and subsequent emergent v5 CABG)  in March 2017, ischemic cardiomyopathy, HTN and former tobacco abuse. He presented to the ED on 09/07/16 with chest pain.   [name] has a difficult history of CAD. In March of 2017 he was admitted with STEMI and subsequently had emergent 5 vessel CABG. He was admitted again in August 2017 just 5 months later and found to have patent grafts except for the SVG to diagonal which was occluded. He was again admitted in Oct. 2017 with chest pain. He underwent left heart cath and PV angiogram as he had some claudication symptoms. His coronary anatomy was unchanged from the prior cath in Aug. 2017. His peripheral angiogram showed no obstructive disease.   Today he presents with chest pain. He was sleeping last night and he woke up in a cold sweat. He took the trash out and felt particularly short of breath, which is rare for him. He took his BP and the SBP was 195. He then began having chest pain and took a SL Nitro x2 and this helped his pain but the chest pain came right back so he called EMS. He says the chest pain was  substernal and went up the right side of his neck. He said the pain was pressure as well as felt like burning. His EKG showed NSR, with no acute changes from his prior EKG.   He denies edema, orthopnea and PND. He says that his toes were blue last night. He has had a PV Angiogram with Dr. [name] in Oct. 2017 that showed no large obstruction in the main vessels.   He denies chest pain currently. Says that his BP has been running higher than usual. BP here in the ED is 134/77.   Hospital Course     He  had a left heart cath this admission. Report above. Only minimal progression in disease. Isosorbide was added to his medication regimen.   He has a high amount of anxiety. We talked for a long time about managing his anxiety with counseling.   1. Chest pain with history of CAD: Mr. Kocak has a long history of CAD and presents with chest pain and SOB after taking out the trash. He had moderate CAD in his vein grafts in Oct. 2017. Also had early graft failure in August 2017 when he was found to have an occluded SVG to diagonal.   Heart cath yesterday showed minimal change in coronary anatomy since prior cath.    2. History of CAD s/p CABG: LIMA-LAD, SVG to diagonal (occluded), SVG to ramus and OM, SVG to PDA.   Continue ASA and Plavix.   3. Ischemic cardiomyopathy: Continue ACE-I. Euvolemic today. EF noted to be 50% at cath yesterday, last Echo with EF of 45%.   4. HLD: Continue high intensity statin  _____________  Discharge Vitals Blood pressure 113/76, pulse 76, temperature 98.4 F (36.9 C), temperature source Oral, resp. rate 14, height '5\' 9"'$  (1.753 m), weight 182 lb 14.4 oz (83 kg), SpO2 98 %.  Filed Weights   09/07/16 1823 09/08/16 0500 09/09/16 0552  Weight: 180 lb (81.6 kg) 178 lb 8 oz (81 kg) 182 lb 14.4 oz (83 kg)    Labs & Radiologic Studies     CBC  Recent Labs  09/07/16 1051 09/08/16 0549  WBC 7.8 7.7  NEUTROABS 5.7  --   HGB 14.8 14.6  HCT 45.2 44.6  MCV 92.2 93.5  PLT 195 878   Basic Metabolic Panel  Recent Labs  09/07/16 1051 09/08/16 0549  NA 140 141  K 4.9 4.9  CL 105 104  CO2 28 27  GLUCOSE 103* 90  BUN 19 18  CREATININE 1.10 1.04  CALCIUM 10.0 9.8   Liver Function Tests  Recent Labs  09/07/16 1051  AST 18  ALT 18  ALKPHOS 62  BILITOT 0.6  PROT 7.4  ALBUMIN 4.4   Hemoglobin A1C  Recent Labs  09/07/16 1942  HGBA1C 5.1    Dg Chest Port 1 View  Result Date: 09/07/2016 CLINICAL DATA:  Chest pain. EXAM: PORTABLE CHEST  1 VIEW COMPARISON:  Radiographs of June 10, 2016. FINDINGS: The heart size and mediastinal contours are within normal limits. Sternotomy wires are noted. No pneumothorax or pleural effusion is noted. Both lungs are clear. The visualized skeletal structures are unremarkable. IMPRESSION: No acute cardiopulmonary abnormality seen. Electronically Signed   By: Marijo Conception, M.D.   On: 09/07/2016 11:58    Disposition   Pt is being discharged home today in good condition.  Follow-up Plans & Appointments    Follow-up Information    Larae Grooms, MD Follow up on 09/15/2016.   Specialties:  Cardiology, Radiology, Interventional Cardiology Why:  at 2:45 pm for hospital follow up.  Contact information: [address] [place] [address] [phone]          Discharge Instructions    Diet - low sodium heart healthy    Complete by:  As directed    Increase activity slowly    Complete by:  As directed       Discharge Medications   Current Discharge Medication List    START taking these medications   Details  isosorbide mononitrate (IMDUR) 30 MG 24 hr tablet Take 1 tablet (30 mg total) by mouth daily. Qty: 30 tablet, Refills: 12    metoprolol tartrate (LOPRESSOR) 25 MG tablet Take 0.5 tablets (12.5 mg total) by mouth 2 (two) times daily. Qty: 30 tablet, Refills: 12      CONTINUE these medications which have NOT CHANGED   Details  aluminum hydroxide-magnesium carbonate (GAVISCON) 95-358 MG/15ML SUSP Take 30 mLs by mouth as needed for indigestion or heartburn.    aspirin 81 MG tablet Take 81 mg by mouth daily.    atorvastatin (LIPITOR) 40 MG tablet Take 1 tablet (40 mg total) by mouth daily. Qty: 30 tablet, Refills: 11    chlordiazePOXIDE (LIBRIUM) 10 MG capsule Take 10 mg by mouth daily as needed for anxiety.     clopidogrel (PLAVIX) 75 MG tablet Take 75 mg by mouth daily.    DULoxetine (CYMBALTA) 60 MG capsule Take 60 mg by mouth daily.       ezetimibe (ZETIA) 10 MG tablet Take 1 tablet (10 mg total) by mouth daily. Qty: 30 tablet, Refills: 11    fluticasone (FLONASE) 50 MCG/ACT nasal spray Place 2 sprays into both nostrils daily.    lisinopril (PRINIVIL,ZESTRIL) 5 MG tablet Take 1 tablet daily. May take additional tablet once daily for SBP greater than 150 one hour after taking daily dose. Qty: 135 tablet, Refills: 3    Multiple Vitamins-Minerals (ONE-A-DAY MENS 50+ ADVANTAGE PO) Take 1 tablet by mouth daily.     nitroGLYCERIN (NITROSTAT) 0.4 MG SL tablet Place 0.4 mg under the tongue every 5 (five) minutes as needed for chest pain. 3 DOSE MAX    ranitidine (ZANTAC) 150 MG tablet Take 150 mg by mouth daily as needed for heartburn.     zolpidem (AMBIEN) 10 MG tablet Take 10 mg by mouth at bedtime as needed for sleep. Reported on 02/09/2016            Outstanding Labs/Studies     Duration of Discharge Encounter   Greater than 30 minutes including physician time.  Signed, [name] NP 09/09/2016, 2:27 PM

## 2016-09-09 NOTE — Progress Notes (Signed)
Patient Name: [name] Date of Encounter: 09/09/2016  Primary Cardiologist: Dr. [facility] Problem List     Active Problems:   Unstable angina [facility])     Subjective   Very anxious, afraid he might not have long to live. Denies chest pain. Feels nauseous.   Inpatient Medications    Scheduled Meds: . aspirin  324 mg Oral Once  . aspirin EC  81 mg Oral Daily  . atorvastatin  80 mg Oral q1800  . clopidogrel  75 mg Oral Daily  . DULoxetine  60 mg Oral Daily  . ezetimibe  10 mg Oral Daily  . famotidine  20 mg Oral Daily  . fluticasone  2 spray Each Nare Daily  . gi cocktail  30 mL Oral Once  . heparin  5,000 Units Subcutaneous Q8H  . isosorbide mononitrate  30 mg Oral Daily  . lisinopril  5 mg Oral Daily  . metoprolol tartrate  25 mg Oral BID  . sodium chloride flush  3 mL Intravenous Q12H   Continuous Infusions:  PRN Meds: sodium chloride, acetaminophen, chlordiazePOXIDE, HYDROmorphone (DILAUDID) injection, nitroGLYCERIN, ondansetron (ZOFRAN) IV, sodium chloride flush, zolpidem   Vital Signs    Vitals:   09/08/16 1827 09/08/16 2017 09/08/16 2159 09/09/16 0552  BP: 102/71  111/64 (!) 109/59  Pulse:   (!) 58 77  Resp:      Temp:  98 F (36.7 C)  98 F (36.7 C)  TempSrc:  Axillary  Oral  SpO2: 97%   99%  Weight:    182 lb 14.4 oz (83 kg)  Height:        Intake/Output Summary (Last 24 hours) at 09/09/16 0846 Last data filed at 09/08/16 1955  Gross per 24 hour  Intake              702 ml  Output              325 ml  Net              377 ml   Filed Weights   09/07/16 1823 09/08/16 0500 09/09/16 0552  Weight: 180 lb (81.6 kg) 178 lb 8 oz (81 kg) 182 lb 14.4 oz (83 kg)    Physical Exam    GEN: Well nourished, well developed, in no acute distress.  HEENT: Grossly normal.  Neck: Supple, no JVD, carotid bruits, or masses. Cardiac: RRR, no murmurs, rubs, or gallops. No clubbing, cyanosis, edema.  Radials/DP/PT 2+ and equal bilaterally.    Respiratory:  Respirations regular and unlabored, clear to auscultation bilaterally. GI: Soft, nontender, nondistended, BS + x 4. MS: no deformity or atrophy. Skin: warm and dry, no rash. Neuro:  Strength and sensation are intact. Psych: AAOx3.  Normal affect.  Labs    CBC  Recent Labs  09/07/16 1051 09/08/16 0549  WBC 7.8 7.7  NEUTROABS 5.7  --   HGB 14.8 14.6  HCT 45.2 44.6  MCV 92.2 93.5  PLT 195 517   Basic Metabolic Panel  Recent Labs  09/07/16 1051 09/08/16 0549  NA 140 141  K 4.9 4.9  CL 105 104  CO2 28 27  GLUCOSE 103* 90  BUN 19 18  CREATININE 1.10 1.04  CALCIUM 10.0 9.8   Liver Function Tests  Recent Labs  09/07/16 1051  AST 18  ALT 18  ALKPHOS 62  BILITOT 0.6  PROT 7.4  ALBUMIN 4.4   Hemoglobin A1C  Recent Labs  09/07/16 1942  HGBA1C  5.1     Telemetry    NSR - Personally Reviewed  ECG    NSR  - Personally Reviewed  Radiology    Dg Chest Port 1 View  Result Date: 09/07/2016 CLINICAL DATA:  Chest pain. EXAM: PORTABLE CHEST 1 VIEW COMPARISON:  Radiographs of June 10, 2016. FINDINGS: The heart size and mediastinal contours are within normal limits. Sternotomy wires are noted. No pneumothorax or pleural effusion is noted. Both lungs are clear. The visualized skeletal structures are unremarkable. IMPRESSION: No acute cardiopulmonary abnormality seen. Electronically Signed   By: Marijo Conception, M.D.   On: 09/07/2016 11:58    Cardiac Studies   Left Heart Cath and Cors/Grafts Angiography 09/08/16  The left ventricular systolic function is normal.  LV end diastolic pressure is mildly elevated.  The left ventricular ejection fraction is 50-55% by visual estimate.  Ost LAD to Prox LAD lesion, 75 %stenosed.  Ost Ramus lesion, 90 %stenosed.  Ramus lesion, 75 %stenosed.  Ost RCA to Prox RCA lesion, 50 %stenosed.  RPDA lesion, 40 %stenosed.  Origin to Mid Graft lesion, 60 %stenosed.  Origin to Mid Graft lesion, 50  %stenosed.  SVG.  Origin to Prox Graft lesion, 100 %stenosed.  Origin to Prox Graft lesion, 70 %stenosed.  1st Diag lesion, 60 %stenosed.  Ost LM lesion, 60 %stenosed.  Ost Cx lesion, 70 %stenosed.   1. Significant underlying three-vessel coronary artery disease with patent grafts except for SVG to diagonal which is known to be occluded from before. Stable moderate disease in SVG to OM. There is mild progression of disease of proximal SVG to right PDA which might be hemodynamically significant. However, the native right coronary artery has only moderate nonobstructive disease. Thus, no indication for revascularization. There is more antegrade flow going down the native left coronary system due to what seems to be improvement in left main disease and ostial LAD and left circumflex disease.  2. Low normal LV systolic function with an EF of 50%. Mildly elevated left ventricular end-diastolic pressure.  Recommendations: Minimal change in coronary anatomy since most recent cardiac catheterization. I recommend intensifying antianginal therapy. I added metoprolol.  Diagnostic Diagram            Patient Profile     [name] is a 61 year old male with a past medical history of CAD s/p CABG (STEMI s/p POBA of LAD with IABP and subsequent emergent v5 CABG) in March 2017, ischemic cardiomyopathy, HTN and former tobacco abuse. He presented to the ED on 09/07/16 with chest pain. For cath today.    Assessment & Plan    1. Chest pain with history of CAD: [name] has a long history of CAD and presents with chest pain and SOB after taking out the trash. He had moderate CAD in his vein grafts in Oct. 2017. Also had early graft failure in August 2017 when he was found to have an occluded SVG to diagonal.   Heart cath yesterday showed minimal change in coronary anatomy since prior cath.    2. History of CAD s/p CABG: LIMA-LAD, SVG to diagonal (occluded), SVG to ramus and OM, SVG to  PDA.   Continue ASA and Plavix.   3. Ischemic cardiomyopathy: Continue ACE-I. Euvolemic today. EF noted to be 50% at cath yesterday, last Echo with EF of 45%.   4. HLD: Continue high intensity statin  Long discussion with patient about his anxiety. He has not quite been the same since his MI in march  2017. We talked about perhaps seeking some counseling for his anxiety.   Signed, [name], NP  09/09/2016, 8:46 AM   I have personally seen and examined this patient with [name], NP. I agree with the assessment and plan as outlined above. He was admitted with chest pain. Coronary disease, grafts unchanged. He is stable this am. Will discharge home today on Imdur.   Lauree Chandler  09/09/2016 9:33 AM

## 2016-09-09 NOTE — Discharge Instructions (Signed)
Radial Site Care Introduction Refer to this sheet in the next few weeks. These instructions provide you with information about caring for yourself after your procedure. Your health care provider may also give you more specific instructions. Your treatment has been planned according to current medical practices, but problems sometimes occur. Call your health care provider if you have any problems or questions after your procedure. What can I expect after the procedure? After your procedure, it is typical to have the following:  Bruising at the radial site that usually fades within 1-2 weeks.  Blood collecting in the tissue (hematoma) that may be painful to the touch. It should usually decrease in size and tenderness within 1-2 weeks. Follow these instructions at home:  Take medicines only as directed by your health care provider.  You may shower 24-48 hours after the procedure or as directed by your health care provider. Remove the bandage (dressing) and gently wash the site with plain soap and water. Pat the area dry with a clean towel. Do not rub the site, because this may cause bleeding.  Do not take baths, swim, or use a hot tub until your health care provider approves.  Check your insertion site every day for redness, swelling, or drainage.  Do not apply powder or lotion to the site.  Do not flex or bend the affected arm for 24 hours or as directed by your health care provider.  Do not push or pull heavy objects with the affected arm for 24 hours or as directed by your health care provider.  Do not lift over 10 lb (4.5 kg) for 5 days after your procedure or as directed by your health care provider.  Ask your health care provider when it is okay to:  Return to work or school.  Resume usual physical activities or sports.  Resume sexual activity.  Do not drive home if you are discharged the same day as the procedure. Have someone else drive you.  You may drive 24 hours after the  procedure unless otherwise instructed by your health care provider.  Do not operate machinery or power tools for 24 hours after the procedure.  If your procedure was done as an outpatient procedure, which means that you went home the same day as your procedure, a responsible adult should be with you for the first 24 hours after you arrive home.  Keep all follow-up visits as directed by your health care provider. This is important. Contact a health care provider if:  You have a fever.  You have chills.  You have increased bleeding from the radial site. Hold pressure on the site. Get help right away if:  You have unusual pain at the radial site.  You have redness, warmth, or swelling at the radial site.  You have drainage (other than a small amount of blood on the dressing) from the radial site.  The radial site is bleeding, and the bleeding does not stop after 30 minutes of holding steady pressure on the site.  Your arm or hand becomes pale, cool, tingly, or numb. This information is not intended to replace advice given to you by your health care provider. Make sure you discuss any questions you have with your health care provider. Document Released: 09/18/2010 Document Revised: 01/22/2016 Document Reviewed: 03/04/2014  2017 Elsevier    Heart-Healthy Eating Plan Introduction Heart-healthy meal planning includes:  Limiting unhealthy fats.  Increasing healthy fats.  Making other small dietary changes. You may need to talk with  your doctor or a diet specialist (dietitian) to create an eating plan that is right for you. What types of fat should I choose?  Choose healthy fats. These include olive oil and canola oil, flaxseeds, walnuts, almonds, and seeds.  Eat more omega-3 fats. These include salmon, mackerel, sardines, tuna, flaxseed oil, and ground flaxseeds. Try to eat fish at least twice each week.  Limit saturated fats.  Saturated fats are often found in animal  products, such as meats, butter, and cream.  Plant sources of saturated fats include palm oil, palm kernel oil, and coconut oil.  Avoid foods with partially hydrogenated oils in them. These include stick margarine, some tub margarines, cookies, crackers, and other baked goods. These contain trans fats. What general guidelines do I need to follow?  Check food labels carefully. Identify foods with trans fats or high amounts of saturated fat.  Fill one half of your plate with vegetables and green salads. Eat 4-5 servings of vegetables per day. A serving of vegetables is:  1 cup of raw leafy vegetables.   cup of raw or cooked cut-up vegetables.   cup of vegetable juice.  Fill one fourth of your plate with whole grains. Look for the word "whole" as the first word in the ingredient list.  Fill one fourth of your plate with lean protein foods.  Eat 4-5 servings of fruit per day. A serving of fruit is:  One medium whole fruit.   cup of dried fruit.   cup of fresh, frozen, or canned fruit.   cup of 100% fruit juice.  Eat more foods that contain soluble fiber. These include apples, broccoli, carrots, beans, peas, and barley. Try to get 20-30 g of fiber per day.  Eat more home-cooked food. Eat less restaurant, buffet, and fast food.  Limit or avoid alcohol.  Limit foods high in starch and sugar.  Avoid fried foods.  Avoid frying your food. Try baking, boiling, grilling, or broiling it instead. You can also reduce fat by:  Removing the skin from poultry.  Removing all visible fats from meats.  Skimming the fat off of stews, soups, and gravies before serving them.  Steaming vegetables in water or broth.  Lose weight if you are overweight.  Eat 4-5 servings of nuts, legumes, and seeds per week:  One serving of dried beans or legumes equals  cup after being cooked.  One serving of nuts equals 1 ounces.  One serving of seeds equals  ounce or one tablespoon.  You  may need to keep track of how much salt or sodium you eat. This is especially true if you have high blood pressure. Talk with your doctor or dietitian to get more information. What foods can I eat? Grains  Breads, including [place], white, pita, wheat, raisin, rye, oatmeal, and [place]. Tortillas that are neither fried nor made with lard or trans fat. Low-fat rolls, including hotdog and hamburger buns and English muffins. Biscuits. Muffins. Waffles. Pancakes. Light popcorn. Whole-grain cereals. Flatbread. Melba toast. Pretzels. Breadsticks. Rusks. Low-fat snacks. Low-fat crackers, including oyster, saltine, matzo, graham, animal, and rye. Rice and pasta, including brown rice and pastas that are made with whole wheat. Vegetables  All vegetables. Fruits  All fruits, but limit coconut. Meats and Other Protein Sources  Lean, well-trimmed beef, veal, pork, and lamb. Chicken and [place] without skin. All fish and shellfish. Wild duck, rabbit, pheasant, and venison. Egg whites or low-cholesterol egg substitutes. Dried beans, peas, lentils, and tofu. Seeds and most nuts. Dairy  Low-fat or nonfat cheeses, including ricotta, string, and mozzarella. Skim or 1% milk that is liquid, powdered, or evaporated. Buttermilk that is made with low-fat milk. Nonfat or low-fat yogurt. Beverages  Mineral water. Diet carbonated beverages. Sweets and Desserts  Sherbets and fruit ices. Honey, jam, marmalade, jelly, and syrups. Meringues and gelatins. Pure sugar candy, such as hard candy, jelly beans, gumdrops, mints, marshmallows, and small amounts of dark chocolate. [employer] Eat all sweets and desserts in moderation. Fats and Oils  Nonhydrogenated (trans-free) margarines. Vegetable oils, including soybean, sesame, sunflower, olive, peanut, safflower, corn, canola, and cottonseed. Salad dressings or mayonnaise made with a vegetable oil. Limit added fats and oils that you use for cooking, baking, salads, and as  spreads. Other  Cocoa powder. Coffee and tea. All seasonings and condiments. The items listed above may not be a complete list of recommended foods or beverages. Contact your dietitian for more options.  What foods are not recommended? Grains  Breads that are made with saturated or trans fats, oils, or whole milk. Croissants. Butter rolls. Cheese breads. Sweet rolls. Donuts. Buttered popcorn. Chow mein noodles. High-fat crackers, such as cheese or butter crackers. Meats and Other Protein Sources  Fatty meats, such as hotdogs, short ribs, sausage, spareribs, bacon, rib eye roast or steak, and mutton. High-fat deli meats, such as salami and bologna. Caviar. Domestic duck and goose. Organ meats, such as kidney, liver, sweetbreads, and heart. Dairy  Cream, sour cream, cream cheese, and creamed cottage cheese. Whole-milk cheeses, including blue (bleu), Monte[place] Jack, [place], [place], American, [place], Swiss, cheddar, [place], and [place]. Whole or 2% milk that is liquid, evaporated, or condensed. Whole buttermilk. Cream sauce or high-fat cheese sauce. Yogurt that is made from whole milk. Beverages  Regular sodas and juice drinks with added sugar. Sweets and Desserts  Frosting. Pudding. Cookies. Cakes other than angel food cake. Candy that has milk chocolate or white chocolate, hydrogenated fat, butter, coconut, or unknown ingredients. Buttered syrups. Full-fat ice cream or ice cream drinks. Fats and Oils  Gravy that has suet, meat fat, or shortening. Cocoa butter, hydrogenated oils, palm oil, coconut oil, palm kernel oil. These can often be found in baked products, candy, fried foods, nondairy creamers, and whipped toppings. Solid fats and shortenings, including bacon fat, salt pork, lard, and butter. Nondairy cream substitutes, such as coffee creamers and sour cream substitutes. Salad dressings that are made of unknown oils, cheese, or sour cream. The items listed above may not be a complete list of  foods and beverages to avoid. Contact your dietitian for more information.  This information is not intended to replace advice given to you by your health care provider. Make sure you discuss any questions you have with your health care provider. Document Released: 02/15/2012 Document Revised: 01/22/2016 Document Reviewed: 02/07/2014  2017 Elsevier

## 2016-09-15 ENCOUNTER — Ambulatory Visit: Payer: BC Managed Care – PPO | Admitting: Interventional Cardiology

## 2016-09-30 ENCOUNTER — Ambulatory Visit (INDEPENDENT_AMBULATORY_CARE_PROVIDER_SITE_OTHER): Payer: BC Managed Care – PPO | Admitting: Cardiology

## 2016-09-30 ENCOUNTER — Encounter: Payer: Self-pay | Admitting: Cardiology

## 2016-09-30 ENCOUNTER — Encounter (INDEPENDENT_AMBULATORY_CARE_PROVIDER_SITE_OTHER): Payer: Self-pay

## 2016-09-30 VITALS — BP 120/84 | HR 72 | Ht 69.0 in | Wt 192.0 lb

## 2016-09-30 DIAGNOSIS — I2510 Atherosclerotic heart disease of native coronary artery without angina pectoris: Secondary | ICD-10-CM | POA: Diagnosis not present

## 2016-09-30 NOTE — Progress Notes (Signed)
09/30/2016 [name]   1955/12/27  [id number]  Primary Physician [name], MD Primary Cardiologist: Dr. Irish Lack    Reason for Visit/CC: f/u for CAD   HPI:  [name] is a 61 year old male with a past medical history of CAD s/p CABG (STEMI s/p POBA of LAD with IABP and subsequent emergent v5 CABG) in March 2017, ischemic cardiomyopathy, HTN and former tobacco abuse. He presented to the ED on 09/07/16 with chest pain. He is here today for post hospital f/u.    [name] has a difficult history of CAD. In March of 2017 he was admitted with STEMI and subsequently had emergent 5 vessel CABG. He was admitted again in August 2017 just 5 months later and found to have patent grafts except for the SVG to diagonal which was occluded. He was again admitted in Oct. 2017 with chest pain. He underwent left heart cath and PV angiogram as he had some claudication symptoms. His coronary anatomy was unchanged from the prior cath in Aug. 2017. His peripheral angiogram showed no obstructive disease.  He underwent repeat LHC on 09/08/16. Cath showed minimal change in coronary anatomy since prior cath (see cath details below). EF was 50%. Continued medial therapy was recommended. He was continued on ASA and Plavix, Imdur and Metoprolol were added to his regimen. He was also continued on high dose Lipitor.   LHC Findings 09/08/16 1. Significant underlying three-vessel coronary artery disease with patent grafts except for SVG to diagonal which is known to be occluded from before. Stable moderate disease in SVG to OM. There is mild progression of disease of proximal SVG to right PDA which might be hemodynamically significant. However, the native right coronary artery has only moderate nonobstructive disease. Thus, no indication for revascularization. There is more antegrade flow going down the native left coronary system due to what seems to be improvement in left main disease and ostial LAD and left  circumflex disease.  2. Low normal LV systolic function with an EF of 50%. Mildly elevated left ventricular end-diastolic pressure.  Today in clinic, he reports that he has done well from a symptom standpoint. He denies any recurrent CP. No dyspnea. No exertional symptoms. He is tolerating the new meds w/o side effects. BP is well controlled 120/84. HR is 72. He reports full medication compliance. No tobacco use. Unfortunately, he continues to have a lot of anxiety. He is followed by psychiatrist.    Current Meds  Medication Sig  . aluminum hydroxide-magnesium carbonate (GAVISCON) 95-358 MG/15ML SUSP Take 30 mLs by mouth as needed for indigestion or heartburn.  Marland Kitchen aspirin 81 MG tablet Take 81 mg by mouth daily.  Marland Kitchen atorvastatin (LIPITOR) 40 MG tablet Take 1 tablet (40 mg total) by mouth daily. (Patient taking differently: Take 80 mg by mouth daily. )  . chlordiazePOXIDE (LIBRIUM) 10 MG capsule Take 10 mg by mouth daily as needed for anxiety.   . clopidogrel (PLAVIX) 75 MG tablet Take 75 mg by mouth daily.  Marland Kitchen ezetimibe (ZETIA) 10 MG tablet Take 1 tablet (10 mg total) by mouth daily.  . fluticasone (FLONASE) 50 MCG/ACT nasal spray Place 2 sprays into both nostrils daily.  . isosorbide mononitrate (IMDUR) 30 MG 24 hr tablet Take 1 tablet (30 mg total) by mouth daily.  Marland Kitchen lisinopril (PRINIVIL,ZESTRIL) 5 MG tablet Take 1 tablet daily. May take additional tablet once daily for SBP greater than 150 one hour after taking daily dose.  . metoprolol tartrate (LOPRESSOR) 25 MG  tablet Take 0.5 tablets (12.5 mg total) by mouth 2 (two) times daily.  . Multiple Vitamins-Minerals (ONE-A-DAY MENS 50+ ADVANTAGE PO) Take 1 tablet by mouth daily.   . nitroGLYCERIN (NITROSTAT) 0.4 MG SL tablet Place 0.4 mg under the tongue every 5 (five) minutes as needed for chest pain. 3 DOSE MAX  . ranitidine (ZANTAC) 150 MG tablet Take 150 mg by mouth daily as needed for heartburn.   . zolpidem (AMBIEN) 10 MG tablet Take 10 mg by  mouth at bedtime as needed for sleep. Reported on 02/09/2016   Allergies  Allergen Reactions  . Prednisone Other (See Comments)    States that this med makes him "crazy"  . Tetanus Toxoids Swelling and Other (See Comments)    Fever, Swelling of the arm   . Wellbutrin [Bupropion] Other (See Comments)    Crazy thoughts, nightmares  . Chantix [Varenicline] Other (See Comments)    Dreams   Past Medical History:  Diagnosis Date  . Acute hepatitis C without mention of hepatic coma(070.51)    Tx by [facility] for Hep.  negative for hep c 2/15.  Marland Kitchen Anxiety   . CAD (coronary artery disease)    a. 10/2015 ant STEMI >> LHC with 3 v CAD; oLAD tx with POBA >> emergent CABG.  . Chronic systolic CHF (congestive heart failure) ([place])   . Colitis   . Dyspnea   . Esophageal reflux    eosinophil esophagitis  . Former tobacco use   . Ischemic cardiomyopathy    a. EF 25-30% at intraop TEE 4/17  //  b. Limited Echo 5/17 - EF 45-50%, mild ant HK  . Sinus bradycardia    a. HR dropping into 40s in 02/2016 -> BB reduced.  . Symptomatic hypotension    a. 02/2016 ER visit -> meds reduced.   Family History  Problem Relation Age of Onset  . Lung cancer Mother   . Heart Problems Father   . Heart attack Father 52  . Stroke Father   . Heart attack Maternal Grandmother   . Stroke Maternal Grandmother   . Heart attack Paternal Uncle   . Hypertension Brother   . Autoimmune disease Neg Hx    Past Surgical History:  Procedure Laterality Date  . CARDIAC CATHETERIZATION N/A 11/28/2015   Procedure: Left Heart Cath and Coronary Angiography;  Surgeon: [name], MD;  Location: [facility];  Service: Cardiovascular;  Laterality: N/A;  . CARDIAC CATHETERIZATION N/A 11/28/2015   Procedure: Coronary Balloon Angioplasty;  Surgeon: [name], MD;  Location: [facility];  Service: Cardiovascular;  Laterality: N/A;  ostial LAD  . CARDIAC CATHETERIZATION N/A 11/28/2015   Procedure: Coronary/Graft  Angiography;  Surgeon: [name], MD;  Location: [facility];  Service: Cardiovascular;  Laterality: N/A;  coronaries only   . CARDIAC CATHETERIZATION N/A 04/21/2016   Procedure: Left Heart Cath and Coronary Angiography;  Surgeon: Wellington Hampshire, MD;  Location: [facility];  Service: Cardiovascular;  Laterality: N/A;  . CARDIAC CATHETERIZATION N/A 06/14/2016   Procedure: Left Heart Cath and Cors/Grafts Angiography;  Surgeon: Lorretta Harp, MD;  Location: [facility];  Service: Cardiovascular;  Laterality: N/A;  . CARDIAC CATHETERIZATION N/A 09/08/2016   Procedure: Left Heart Cath and Cors/Grafts Angiography;  Surgeon: Wellington Hampshire, MD;  Location: [facility];  Service: Cardiovascular;  Laterality: N/A;  . CORONARY ARTERY BYPASS GRAFT N/A 11/28/2015   Procedure: CORONARY ARTERY BYPASS GRAFTING (  CABG) TIMES FIVE USING LEFT INTERNAL MAMMARY ARTERY AND RIGHT GREATER SAPHENOUS,VIEN HARVEATED BY ENDOVIEN, INTRAOPPRATIVE TEE;  Surgeon: Gaye Pollack, MD;  Location: [facility];  Service: Open Heart Surgery;  Laterality: N/A;  . PERIPHERAL VASCULAR CATHETERIZATION N/A 06/14/2016   Procedure: Lower Extremity Angiography;  Surgeon: Lorretta Harp, MD;  Location: [facility];  Service: Cardiovascular;  Laterality: N/A;  . SHOULDER SURGERY     Social History   Social History  . Marital status: Married    Spouse name: [name]  . Number of children: 3  . Years of education: College   Occupational History  . Self-employed Self-Employed   Social History Main Topics  . Smoking status: Former Smoker    Packs/day: 0.50    Types: Cigarettes    Quit date: 11/28/2015  . Smokeless tobacco: Never Used  . Alcohol use No  . Drug use: No  . Sexual activity: Not on file   Other Topics Concern  . Not on file   Social History Narrative   Patient lives at home with his spouse.   Caffeine Use: yes     Review of Systems: General: negative for chills, fever,  night sweats or weight changes.  Cardiovascular: negative for chest pain, dyspnea on exertion, edema, orthopnea, palpitations, paroxysmal nocturnal dyspnea or shortness of breath Dermatological: negative for rash Respiratory: negative for cough or wheezing Urologic: negative for hematuria Abdominal: negative for nausea, vomiting, diarrhea, bright red blood per rectum, melena, or hematemesis Neurologic: negative for visual changes, syncope, or dizziness All other systems reviewed and are otherwise negative except as noted above.   Physical Exam:  Blood pressure 120/84, pulse 72, height '5\' 9"'$  (1.753 m), weight 192 lb (87.1 kg).  General appearance: alert, cooperative and no distress Neck: no carotid bruit and no JVD Lungs: clear to auscultation bilaterally Heart: regular rate and rhythm, S1, S2 normal, no murmur, click, rub or gallop Extremities: extremities normal, atraumatic, no cyanosis or edema Pulses: 2+ and symmetric Skin: Skin color, texture, turgor normal. No rashes or lesions Neurologic: Grossly normal  EKG not performed   ASSESSMENT AND PLAN:   1. CAD: s/p CABG: LIMA-LAD, SVG to diagonal (occluded), SVG to ramus and OM, SVG to PDA. Repeat LHC 09/08/16 showed Significant underlying three-vessel coronary artery disease with patent grafts except for SVG to diagonal which is known to be occluded from before. Stable moderate disease in SVG to OM. There is mild progression of disease of proximal SVG to right PDA which might be hemodynamically significant. However, the native right coronary artery has only moderate nonobstructive disease. Thus, no indication for revascularization. There is more antegrade flow going down the native left coronary system due to what seems to be improvement in left main disease and ostial LAD and left circumflex disease. Low normal EF of 50%. Continued medical therapy was recommended. He is doing well with medical therapy. No recurrent CP after initiation of  Imdur and metoprolol. HR and BP are both stable. Continue ASA, Plavix, high dose Lipitor, metoprolol, Imdur and lisinopril.    2. HLD: last lipid panel 04/2016 showed an LDL of 103 mg/d/L. Goal in the setting of CAD is < 70 mg/dL. His Lipitor was increased to 80 mg 04/2016. He is also on Zetia. He is fasting today. We will check a FLP and HFTs today. If LDL is not at goal, we will refer him to our lipid clinic for consideration for PCSK9 inhibitor therapy.    PLAN  F/u with Dr.  Union in 3 months.   Damyn Weitzel PA-C 09/30/2016 8:40 AM

## 2016-09-30 NOTE — Patient Instructions (Signed)
Medication Instructions:  Your physician recommends that you continue on your current medications as directed. Please refer to the Current Medication list given to you today.   Labwork: LABS TODAY: LIPIDS, Hepatic function test   Testing/Procedures: NONE  Follow-Up: Your physician wants you to follow-up in: 2-3 months with Dr. Irish Lack. You will receive a reminder letter in the mail two months in advance. If you don't receive a letter, please call our office to schedule the follow-up appointment.   Any Other Special Instructions Will Be Listed Below (If Applicable).     If you need a refill on your cardiac medications before your next appointment, please call your pharmacy.

## 2016-10-01 LAB — LIPID PANEL
Chol/HDL Ratio: 2.4 ratio units (ref 0.0–5.0)
Cholesterol, Total: 130 mg/dL (ref 100–199)
HDL: 55 mg/dL (ref >39)
LDL Calculated: 54 mg/dL (ref 0–99)
Triglycerides: 106 mg/dL (ref 0–149)
VLDL Cholesterol Cal: 21 mg/dL (ref 5–40)

## 2016-10-01 LAB — HEPATIC FUNCTION PANEL
ALT: 27 IU/L (ref 0–44)
AST: 21 IU/L (ref 0–40)
Albumin: 4.7 g/dL (ref 3.6–4.8)
Alkaline Phosphatase: 67 IU/L (ref 39–117)
Bilirubin Total: 0.5 mg/dL (ref 0.0–1.2)
Bilirubin, Direct: 0.15 mg/dL (ref 0.00–0.40)
Total Protein: 7.2 g/dL (ref 6.0–8.5)

## 2016-10-04 ENCOUNTER — Telehealth: Payer: Self-pay | Admitting: Interventional Cardiology

## 2016-10-04 NOTE — Telephone Encounter (Signed)
New Message     Does he need to continue Cholesterol medication?? What were his numbers again?

## 2016-10-04 NOTE — Telephone Encounter (Signed)
PT AWARE  TO CONTINUE   WITH  STATIN  THERAPY  NO CHANGES  AT THIS TIME./CY

## 2016-10-18 ENCOUNTER — Telehealth: Payer: Self-pay | Admitting: Interventional Cardiology

## 2016-10-18 NOTE — Telephone Encounter (Signed)
Follow up   Pt called back [email or web address] list the side effects under the less common section for the Isosorbide, he wanted you to have this information.

## 2016-10-18 NOTE — Telephone Encounter (Addendum)
**Note De-Identified  Obfuscation** The pt was started on Isosorbide 30 mg daily and Metoprolol 12.5 mg BID at his hosp d/c on 09/09/16. He states that since then he has been having confusion, off balance, lethargic and his HR has been on the low side at high 50s to low 60s and is normally in the 70s.  He states that he feels that it is the Isosorbide because he has taken Metoprolol in the past without these symptoms.  I advised him that I have spoken with our pharmacist, Claiborne Billings, who states that his symptoms are unlikely to be caused by Isosorbide and asked if he is taking any new medications. He denies any new medications other than Isosorbide.  He does want Dr Irish Lack to know that other than his previously mentioned symptoms he is feeling stronger and better than he has since his MI.    He wants to know if there is another medication he can take in place of Isosorbide.  Please advise.

## 2016-10-18 NOTE — Telephone Encounter (Signed)
New Message     Pt c/o medication issue:  1. Name of Medication:  isosorbide mononitrate (IMDUR) 30 MG 24 hr tablet Take 1 tablet (30 mg total) by mouth daily.     2. How are you currently taking this medication (dosage and times per day)? 1x daily   3. Are you having a reaction (difficulty breathing--STAT)? None   4. What is your medication issue? Medication makes him very confused, trouble balancing, very lathargic   Pulse is on the low side, but no angina , no chest pain, bp has been great

## 2016-10-19 NOTE — Telephone Encounter (Signed)
OK to hold the isosorbide right now.  Lets see how he feels for about a week or so.

## 2016-10-19 NOTE — Telephone Encounter (Signed)
Patient notified of Dr. Hassell Done recommendations to hold the isosorbide for now and let us know how he feels in about a week. Patient verbalized understanding and is in agreement with this plan.

## 2016-10-28 DIAGNOSIS — I639 Cerebral infarction, unspecified: Secondary | ICD-10-CM

## 2016-10-28 HISTORY — DX: Cerebral infarction, unspecified: I63.9

## 2016-11-02 ENCOUNTER — Emergency Department (HOSPITAL_COMMUNITY)
Admission: EM | Admit: 2016-11-02 | Discharge: 2016-11-02 | Disposition: A | Discharge status: Home / Self Care | Payer: BC Managed Care – PPO | Attending: Emergency Medicine | Admitting: Emergency Medicine

## 2016-11-02 ENCOUNTER — Emergency Department (HOSPITAL_COMMUNITY): Payer: BC Managed Care – PPO

## 2016-11-02 ENCOUNTER — Encounter (HOSPITAL_COMMUNITY): Payer: Self-pay | Admitting: Emergency Medicine

## 2016-11-02 ENCOUNTER — Telehealth: Payer: Self-pay | Admitting: Interventional Cardiology

## 2016-11-02 DIAGNOSIS — Z7982 Long term (current) use of aspirin: Secondary | ICD-10-CM | POA: Diagnosis not present

## 2016-11-02 DIAGNOSIS — I252 Old myocardial infarction: Secondary | ICD-10-CM | POA: Insufficient documentation

## 2016-11-02 DIAGNOSIS — R42 Dizziness and giddiness: Secondary | ICD-10-CM | POA: Insufficient documentation

## 2016-11-02 DIAGNOSIS — I2510 Atherosclerotic heart disease of native coronary artery without angina pectoris: Secondary | ICD-10-CM | POA: Insufficient documentation

## 2016-11-02 DIAGNOSIS — R079 Chest pain, unspecified: Secondary | ICD-10-CM

## 2016-11-02 DIAGNOSIS — Z79899 Other long term (current) drug therapy: Secondary | ICD-10-CM | POA: Insufficient documentation

## 2016-11-02 DIAGNOSIS — Z87891 Personal history of nicotine dependence: Secondary | ICD-10-CM | POA: Diagnosis not present

## 2016-11-02 DIAGNOSIS — I209 Angina pectoris, unspecified: Secondary | ICD-10-CM | POA: Diagnosis not present

## 2016-11-02 DIAGNOSIS — R0789 Other chest pain: Secondary | ICD-10-CM | POA: Insufficient documentation

## 2016-11-02 DIAGNOSIS — I10 Essential (primary) hypertension: Secondary | ICD-10-CM | POA: Insufficient documentation

## 2016-11-02 DIAGNOSIS — I25119 Atherosclerotic heart disease of native coronary artery with unspecified angina pectoris: Secondary | ICD-10-CM

## 2016-11-02 DIAGNOSIS — Z951 Presence of aortocoronary bypass graft: Secondary | ICD-10-CM | POA: Insufficient documentation

## 2016-11-02 LAB — URINALYSIS, ROUTINE W REFLEX MICROSCOPIC
Bacteria, UA: NONE SEEN
Bilirubin Urine: NEGATIVE
Glucose, UA: NEGATIVE mg/dL
Hgb urine dipstick: NEGATIVE
Ketones, ur: NEGATIVE mg/dL
Nitrite: NEGATIVE
Protein, ur: 30 mg/dL — AB
Specific Gravity, Urine: 1.031 — ABNORMAL HIGH (ref 1.005–1.030)
pH: 5 (ref 5.0–8.0)

## 2016-11-02 LAB — BASIC METABOLIC PANEL
Anion gap: 8 (ref 5–15)
BUN: 20 mg/dL (ref 6–20)
CO2: 27 mmol/L (ref 22–32)
Calcium: 9.6 mg/dL (ref 8.9–10.3)
Chloride: 103 mmol/L (ref 101–111)
Creatinine, Ser: 1.1 mg/dL (ref 0.61–1.24)
GFR calc Af Amer: >60 mL/min (ref >60)
GFR calc non Af Amer: >60 mL/min (ref >60)
Glucose, Bld: 95 mg/dL (ref 65–99)
Potassium: 4.7 mmol/L (ref 3.5–5.1)
Sodium: 138 mmol/L (ref 135–145)

## 2016-11-02 LAB — CBC
HCT: 42.9 % (ref 39.0–52.0)
Hemoglobin: 14.3 g/dL (ref 13.0–17.0)
MCH: 30.8 pg (ref 26.0–34.0)
MCHC: 33.3 g/dL (ref 30.0–36.0)
MCV: 92.3 fL (ref 78.0–100.0)
Platelets: 188 10*3/uL (ref 150–400)
RBC: 4.65 MIL/uL (ref 4.22–5.81)
RDW: 13.2 % (ref 11.5–15.5)
WBC: 10.2 10*3/uL (ref 4.0–10.5)

## 2016-11-02 LAB — I-STAT TROPONIN, ED
Troponin i, poc: 0 ng/mL (ref 0.00–0.08)
Troponin i, poc: 0 ng/mL (ref 0.00–0.08)

## 2016-11-02 LAB — PROTIME-INR
INR: 1.06
Prothrombin Time: 13.8 seconds (ref 11.4–15.2)

## 2016-11-02 LAB — TROPONIN I
Troponin I: <0.03 ng/mL (ref <0.03)
Troponin I: 0.09 ng/mL (ref <0.03)

## 2016-11-02 MED ORDER — ISOSORBIDE MONONITRATE ER 30 MG PO TB24
15.0000 mg | ORAL_TABLET | Freq: Every day | ORAL | Status: DC
  Administered 2016-11-02: 15 mg via ORAL
  Filled 2016-11-02: qty 1

## 2016-11-02 MED ORDER — NITROGLYCERIN 0.4 MG SL SUBL: 0.4000 mg | SUBLINGUAL_TABLET | SUBLINGUAL | Status: DC | PRN

## 2016-11-02 MED ORDER — LORAZEPAM 2 MG/ML IJ SOLN
0.5000 mg | Freq: Once | INTRAMUSCULAR | Status: AC
  Administered 2016-11-02: 0.5 mg via INTRAVENOUS
  Filled 2016-11-02: qty 1

## 2016-11-02 MED ORDER — FENTANYL CITRATE (PF) 100 MCG/2ML IJ SOLN
75.0000 ug | Freq: Once | INTRAMUSCULAR | Status: AC
  Administered 2016-11-02: 75 ug via INTRAVENOUS
  Filled 2016-11-02: qty 2

## 2016-11-02 NOTE — ED Notes (Signed)
ED Provider at bedside. 

## 2016-11-02 NOTE — ED Provider Notes (Addendum)
  Physical Exam  BP 103/64   Pulse (!) 54   Temp 98.1 F (36.7 C) (Oral)   Resp 16   Ht '5\' 9"'$  (1.753 m)   Wt 185 lb (83.9 kg)   SpO2 98%   BMI 27.32 kg/m   Physical Exam  ED Course  Procedures  MDM Repeat troponin is negative. Will discharge home.       Davonna Belling, MD 11/02/16 [phone]  Patient had a central lab troponin that was apparently drawn the same time as he i-STAT troponin. He later returned at 0.09. The i-STAT troponin was completely normal. We will rerun the central lab troponin and reevaluate.    Davonna Belling, MD 11/02/16 1956  Repeat of same sample was 0.03. No specific follow up needed    Davonna Belling, MD 11/02/16 2030

## 2016-11-02 NOTE — ED Provider Notes (Signed)
[facility] DEPT Provider Note   CSN: [id number] Arrival date & time: 11/02/16  1252     History   Chief Complaint Chief Complaint  Patient presents with  . Chest Pain    HPI [name] is a 61 y.o. male.  HPI  61 year old male who presents with chest pain. He has a history of CAD s/p CABG, ischemic cardiomyopathy, HTN, HLD. He was recently hospitalized January 2018 for anginal symptoms. He had left heart catheterization on chart review that showed stable coronary artery disease with recommendation of intensifying antianginal medical therapy. He was started on M Dorr, and since then has had no issues with recurrent chest pain. About a week and a half ago he began to have issues with concentration , occasional confusion such as difficulty forming his thoughts. He felt that these could've been side effects of his medication and it was recommended that he discontinue imdur. Still having concentration issues and fuzziness in thinking. Today while getting receipts together at 10AM developed chest pain, pressure and sharp retrosternal. Not worse exertionally or with position changes. No syncope, near syncope, nausea or vomiting, diaphoresis.  Received nitroglycerin (x 6 total) and full aspirin by EMS. Still with persistent chest pain 6/10 in severity.  Past Medical History:  Diagnosis Date  . Acute hepatitis C without mention of hepatic coma(070.51)    Tx by [facility] for Hep.  negative for hep c 2/15.  Marland Kitchen Anxiety   . CAD (coronary artery disease)    a. 10/2015 ant STEMI >> LHC with 3 v CAD; oLAD tx with POBA >> emergent CABG.  . Chronic systolic CHF (congestive heart failure) ([place])   . Colitis   . Dyspnea   . Esophageal reflux    eosinophil esophagitis  . Former tobacco use   . Ischemic cardiomyopathy    a. EF 25-30% at intraop TEE 4/17  //  b. Limited Echo 5/17 - EF 45-50%, mild ant HK  . Sinus bradycardia    a. HR dropping into 40s in 02/2016 -> BB reduced.  . Symptomatic  hypotension    a. 02/2016 ER visit -> meds reduced.    Patient Active Problem List   Diagnosis Date Noted  . Unstable angina ([place]) 09/07/2016  . Claudication of both lower extremities ([place])   . Pure hypercholesterolemia   . Tobacco abuse disorder   . Coronary artery disease involving coronary bypass graft of native heart with unstable angina pectoris ([place])   . Chest pain 06/10/2016  . Abnormal nuclear stress test - HIGH RISK 04/20/2016  . ST elevation myocardial infarction (STEMI) of anterior wall, subsequent episode of care ([place])   . Essential hypertension 02/26/2016  . Ischemic cardiomyopathy 12/25/2015  . Dyslipidemia, goal LDL below 70 12/25/2015  . STEMI (ST elevation myocardial infarction) ([place]) 11/28/2015  . 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 Coronary artery disease involving native coronary artery 11/28/2015  . 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 Acute MI anterior wall first episode care [facility])   . Chest pain with high risk for cardiac etiology 03/07/2015  . Dysphagia 03/07/2015  . Gout attack 03/07/2015  . Mixed bipolar I disorder ([place]) 03/07/2015  . Fibromyalgia 07/09/2014  . Gout 07/09/2014  . Anxiety 07/09/2014  . Depression 07/09/2014  . Hepatitis C 11/20/2012  . Eosinophilic esophagitis 18/84/1660    Past Surgical History:  Procedure Laterality Date  . CARDIAC CATHETERIZATION N/A 11/28/2015   Procedure: Left Heart Cath and Coronary Angiography;  Surgeon: [name],  MD;  Location: [facility];  Service: Cardiovascular;  Laterality: N/A;  . CARDIAC CATHETERIZATION N/A 11/28/2015   Procedure: Coronary Balloon Angioplasty;  Surgeon: [name], MD;  Location: [facility];  Service: Cardiovascular;  Laterality: N/A;  ostial LAD  . CARDIAC CATHETERIZATION N/A 11/28/2015   Procedure: Coronary/Graft Angiography;  Surgeon: [name], MD;  Location: [facility];  Service: Cardiovascular;  Laterality: N/A;  coronaries  only   . CARDIAC CATHETERIZATION N/A 04/21/2016   Procedure: Left Heart Cath and Coronary Angiography;  Surgeon: Wellington Hampshire, MD;  Location: [facility];  Service: Cardiovascular;  Laterality: N/A;  . CARDIAC CATHETERIZATION N/A 06/14/2016   Procedure: Left Heart Cath and Cors/Grafts Angiography;  Surgeon: Lorretta Harp, MD;  Location: [facility];  Service: Cardiovascular;  Laterality: N/A;  . CARDIAC CATHETERIZATION N/A 09/08/2016   Procedure: Left Heart Cath and Cors/Grafts Angiography;  Surgeon: Wellington Hampshire, MD;  Location: [facility];  Service: Cardiovascular;  Laterality: N/A;  . CORONARY ARTERY BYPASS GRAFT N/A 11/28/2015   Procedure: CORONARY ARTERY BYPASS GRAFTING (CABG) TIMES FIVE USING LEFT INTERNAL MAMMARY ARTERY AND RIGHT GREATER SAPHENOUS,VIEN HARVEATED BY ENDOVIEN, INTRAOPPRATIVE TEE;  Surgeon: Gaye Pollack, MD;  Location: [facility];  Service: Open Heart Surgery;  Laterality: N/A;  . PERIPHERAL VASCULAR CATHETERIZATION N/A 06/14/2016   Procedure: Lower Extremity Angiography;  Surgeon: Lorretta Harp, MD;  Location: [facility];  Service: Cardiovascular;  Laterality: N/A;  . SHOULDER SURGERY         Home Medications    Prior to Admission medications   Medication Sig Start Date End Date Taking? Authorizing Provider  aluminum hydroxide-magnesium carbonate (GAVISCON) 95-358 MG/15ML SUSP Take 30 mLs by mouth as needed for indigestion or heartburn.   Yes Historical Provider, MD  aspirin 81 MG tablet Take 81 mg by mouth daily.   Yes Historical Provider, MD  atorvastatin (LIPITOR) 40 MG tablet Take 1 tablet (40 mg total) by mouth daily. Patient taking differently: Take 80 mg by mouth daily.  08/12/16 11/10/16 Yes [name], MD  chlordiazePOXIDE (LIBRIUM) 10 MG capsule Take 10 mg by mouth daily as needed for anxiety.    Yes Historical Provider, MD  clopidogrel (PLAVIX) 75 MG tablet Take 75 mg by mouth daily.   Yes Historical Provider, MD   DM-Doxylamine-Acetaminophen (NYQUIL COLD & FLU PO) Take 1 capsule by mouth at bedtime as needed (cold symptoms).   Yes Historical Provider, MD  ezetimibe (ZETIA) 10 MG tablet Take 1 tablet (10 mg total) by mouth daily. 08/12/16 11/10/16 Yes [name], MD  FLUoxetine (PROZAC) 20 MG tablet Take 20 mg by mouth daily. 11/01/16  Yes Historical Provider, MD  fluticasone (FLONASE) 50 MCG/ACT nasal spray Place 2 sprays into both nostrils daily.   Yes Historical Provider, MD  isosorbide mononitrate (IMDUR) 30 MG 24 hr tablet Take 1 tablet (30 mg total) by mouth daily. Patient taking differently: Take 30 mg by mouth daily.  09/10/16  Yes [name], NP  lisinopril (PRINIVIL,ZESTRIL) 5 MG tablet Take 1 tablet daily. May take additional tablet once daily for SBP greater than 150 one hour after taking daily dose. 09/01/16  Yes [name], MD  metoprolol tartrate (LOPRESSOR) 25 MG tablet Take 0.5 tablets (12.5 mg total) by mouth 2 (two) times daily. Patient taking differently: Take 25 mg by mouth 2 (two) times daily.  09/09/16  Yes [name], NP  Multiple Vitamins-Minerals (ONE-A-DAY MENS 50+ ADVANTAGE PO) Take 1 tablet by mouth daily.    Yes Historical Provider, MD  nitroGLYCERIN (NITROSTAT) 0.4 MG SL tablet Place 0.4 mg under the tongue every 5 (five) minutes as needed for chest pain. 3 DOSE MAX   Yes Historical Provider, MD  ranitidine (ZANTAC) 150 MG tablet Take 150 mg by mouth daily as needed for heartburn.    Yes Historical Provider, MD  zolpidem (AMBIEN) 10 MG tablet Take 10 mg by mouth at bedtime as needed for sleep. Reported on 02/09/2016 11/26/15  Yes Historical Provider, MD    Family History Family History  Problem Relation Age of Onset  . Lung cancer Mother   . Heart Problems Father   . Heart attack Father 47  . Stroke Father   . Heart attack Maternal Grandmother   . Stroke Maternal Grandmother   . Heart attack Paternal Uncle   . Hypertension Brother   . Autoimmune disease  Neg Hx     Social History Social History  Substance Use Topics  . Smoking status: Former Smoker    Packs/day: 0.50    Types: Cigarettes    Quit date: 11/28/2015  . Smokeless tobacco: Never Used  . Alcohol use No     Allergies   Prednisone; Tetanus toxoids; Wellbutrin [bupropion]; and Chantix [varenicline]   Review of Systems Review of Systems 10/14 systems reviewed and are negative other than those stated in the HPI   Physical Exam Updated Vital Signs BP 104/70   Pulse (!) 58   Temp 98.1 F (36.7 C) (Oral)   Resp 10   Ht '5\' 9"'$  (1.753 m)   Wt 185 lb (83.9 kg)   SpO2 97%   BMI 27.32 kg/m   Physical Exam Physical Exam  Nursing note and vitals reviewed. Constitutional: Well developed, well nourished, non-toxic, and in no acute distress Head: Normocephalic and atraumatic.  Mouth/Throat: Oropharynx is clear and moist.  Neck: Normal range of motion. Neck supple.  Cardiovascular: Normal rate and regular rhythm.   Pulmonary/Chest: Effort normal and breath sounds normal.  Abdominal: Soft. There is no tenderness. There is no rebound and no guarding.  Musculoskeletal: Normal range of motion.  Neurological: Alert, no facial droop, fluent speech, moves all extremities symmetrically, sensation for light touch in tact throughout Skin: Skin is warm and dry.  Psychiatric: Cooperative   ED Treatments / Results  Labs (all labs ordered are listed, but only abnormal results are displayed) Labs Reviewed  URINALYSIS, ROUTINE W REFLEX MICROSCOPIC - Abnormal; Notable for the following:       Result Value   Specific Gravity, Urine 1.031 (*)    Protein, ur 30 (*)    Leukocytes, UA SMALL (*)    Squamous Epithelial / LPF 0-5 (*)    All other components within normal limits  BASIC METABOLIC PANEL  CBC  PROTIME-INR  TROPONIN I  TROPONIN I  TROPONIN I  TROPONIN I  I-STAT TROPOININ, ED  I-STAT TROPOININ, ED    EKG  EKG Interpretation  Date/Time:  Tuesday November 02 2016  13:00:13 EST Ventricular Rate:  66 PR Interval:    QRS Duration: 93 QT Interval:  405 QTC Calculation: 425 R Axis:   72 Text Interpretation:  Sinus rhythm Low voltage, precordial leads similar to prior EKG  Confirmed by Nicholad Kautzman MD, Aquila Menzie [phone]) on 11/02/2016 1:53:59 PM       Radiology Dg Chest 2 View  Result Date: 11/02/2016 CLINICAL DATA:  Left chest pain EXAM: CHEST  2 VIEW COMPARISON:  None. FINDINGS: There is no focal parenchymal opacity. There is no pleural effusion or pneumothorax. The heart size is normal. There is evidence of prior CABG. The osseous structures are unremarkable. IMPRESSION: No active cardiopulmonary disease. Electronically Signed   By: Kathreen Devoid   On: 11/02/2016 13:44   Ct Head Wo Contrast  Result Date: 11/02/2016 CLINICAL DATA:  Occasional dizziness, difficulty with concentration, speech changes for few weeks EXAM: CT HEAD WITHOUT CONTRAST TECHNIQUE: Contiguous axial images were obtained from the base of the skull through the vertex without intravenous contrast. COMPARISON:  02/01/2014 FINDINGS: Brain: No intracranial hemorrhage, mass effect or midline shift. No acute cortical infarction. No hydrocephalus. The gray and white-matter differentiation is preserved. No mass lesion is noted on this unenhanced scan. Vascular: Subtle mild atherosclerotic calcifications of carotid siphon. Skull: No skull fracture.  No acute findings. Sinuses/Orbits: No paranasal sinuses air-fluid levels. No intraorbital hematoma. Other: None IMPRESSION: No acute intracranial abnormality. No definite acute cortical infarction. Electronically Signed   By: Lahoma Crocker M.D.   On: 11/02/2016 17:41    Procedures Procedures (including critical care time)  Medications Ordered in ED Medications  nitroGLYCERIN (NITROSTAT) SL tablet 0.4 mg (not administered)  isosorbide mononitrate (IMDUR) 24 hr tablet 15 mg (not administered)  LORazepam (ATIVAN) injection 0.5 mg (not administered)  fentaNYL (SUBLIMAZE)  injection 75 mcg (75 mcg Intravenous Given 11/02/16 1419)     Initial Impression / Assessment and Plan / ED Course  I have reviewed the triage vital signs and the nursing notes.  Pertinent labs & imaging results that were available during my care of the patient were reviewed by me and considered in my medical decision making (see chart for details).     61 year old male with history of CAD and ischemic cardiomyopathy who presents with recurrent chest pain in the setting of recent discontinuation of his imdur. Chest pain resolved after fentanyl. Vitals stable and he is in no acute distress. EKG does not show acute ischemia and his troponin 1 is negative. Chest x-ray visualized and shows no acute cardiopulmonary processes. Chest pain as similar to prior anginal pain, there is concern for ACS although this is likely related to discontinuation of his imdur. With recent stable left heart catheterization January 2018, on chart review. Cardiology consulted. They were more concerned potentially regarding cognitive changes, difficulty concentrating and requesting medical work-up with this. He does not feel his speech is slurred, focal numbness or weakness. States this is something that has been progressive since his CABG about 11 months ago. He has no focal neuro deficits on exam. Recent mechanical fall but no headstrike or LOc, one week ago. Does not seen like an acute neurological process such as stroke. CT head visualized and unremarkable. Does not need admission for further work-up of this and can follow-up with pcp.   Dr. Acie Fredrickson saw in ED. Does not recommend admission. Recommending restarting imdur and outpatient follow-up with DR. Bingen.  Given dose of imdur in ED. Will obtain serial troponin. If negative will discharge per cardiology recommendations. Final Clinical Impressions(s) / ED Diagnoses   Final diagnoses:  Chest pain, unspecified type    New Prescriptions New Prescriptions   No  medications on file     Forde Dandy, MD 11/02/16 1800

## 2016-11-02 NOTE — ED Notes (Signed)
Pt using the urinal

## 2016-11-02 NOTE — Discharge Instructions (Signed)
Restart your home imdur.  Follow-up closely with your cardiologist  Please return without fail for worsening symptoms, including worsening pain, passing out, difficulty breathing or any other symptoms concerning to you.

## 2016-11-02 NOTE — Telephone Encounter (Signed)
New Message   Pt c/o of Chest Pain: STAT if CP now or developed within 24 hours  1. Are you having CP right now? yes  2. Are you experiencing any other symptoms (ex. SOB, nausea, vomiting, sweating)? Sob , disoriented, pain in chest getting severe  3. How long have you been experiencing CP? About hour  4. Is your CP continuous or coming and going? Coming and going   5. Have you taken Nitroglycerin? yes ?

## 2016-11-02 NOTE — ED Notes (Signed)
Cardiology, Ria Comment at bedside.

## 2016-11-02 NOTE — Consult Note (Signed)
Cardiology Consult    Patient ID: Euclid Cassetta MRN: [id number], DOB/AGE: Nov 05, 1955   Admit date: 11/02/2016 Date of Consult: 11/02/2016  Primary Physician: [name], MD Primary Cardiologist: Irish Lack Requesting Provider: Oleta Mouse Reason for Consultation: Chest pain  Patient Profile    61 yo male with PMH of CAD s/p CABG (STEMI s/p POBA of LAD with IABP and subsequent emergent v5 CABG) in March 2017, ischemic cardiomyopathy, HTN and former tobacco abuse who presented with neurological complaints and chest pain.  Past Medical History   Past Medical History:  Diagnosis Date  . Acute hepatitis C without mention of hepatic coma(070.51)    Tx by [facility] for Hep.  negative for hep c 2/15.  Marland Kitchen Anxiety   . CAD (coronary artery disease)    a. 10/2015 ant STEMI >> LHC with 3 v CAD; oLAD tx with POBA >> emergent CABG.  . Chronic systolic CHF (congestive heart failure) ([place])   . Colitis   . Dyspnea   . Esophageal reflux    eosinophil esophagitis  . Former tobacco use   . Ischemic cardiomyopathy    a. EF 25-30% at intraop TEE 4/17  //  b. Limited Echo 5/17 - EF 45-50%, mild ant HK  . Sinus bradycardia    a. HR dropping into 40s in 02/2016 -> BB reduced.  . Symptomatic hypotension    a. 02/2016 ER visit -> meds reduced.    Past Surgical History:  Procedure Laterality Date  . CARDIAC CATHETERIZATION N/A 11/28/2015   Procedure: Left Heart Cath and Coronary Angiography;  Surgeon: [name], MD;  Location: [place] [facility];  Service: Cardiovascular;  Laterality: N/A;  . CARDIAC CATHETERIZATION N/A 11/28/2015   Procedure: Coronary Balloon Angioplasty;  Surgeon: [name], MD;  Location: [place] [facility];  Service: Cardiovascular;  Laterality: N/A;  ostial LAD  . CARDIAC CATHETERIZATION N/A 11/28/2015   Procedure: Coronary/Graft Angiography;  Surgeon: [name], MD;  Location: [place] [facility];  Service: Cardiovascular;  Laterality: N/A;  coronaries only   .  CARDIAC CATHETERIZATION N/A 04/21/2016   Procedure: Left Heart Cath and Coronary Angiography;  Surgeon: Wellington Hampshire, MD;  Location: [place] [facility];  Service: Cardiovascular;  Laterality: N/A;  . CARDIAC CATHETERIZATION N/A 06/14/2016   Procedure: Left Heart Cath and Cors/Grafts Angiography;  Surgeon: Lorretta Harp, MD;  Location: [place] [facility];  Service: Cardiovascular;  Laterality: N/A;  . CARDIAC CATHETERIZATION N/A 09/08/2016   Procedure: Left Heart Cath and Cors/Grafts Angiography;  Surgeon: Wellington Hampshire, MD;  Location: [facility] [facility];  Service: Cardiovascular;  Laterality: N/A;  . CORONARY ARTERY BYPASS GRAFT N/A 11/28/2015   Procedure: CORONARY ARTERY BYPASS GRAFTING (CABG) TIMES FIVE USING LEFT INTERNAL MAMMARY ARTERY AND RIGHT GREATER SAPHENOUS,VIEN HARVEATED BY ENDOVIEN, INTRAOPPRATIVE TEE;  Surgeon: Gaye Pollack, MD;  Location: [facility];  Service: Open Heart Surgery;  Laterality: N/A;  . PERIPHERAL VASCULAR CATHETERIZATION N/A 06/14/2016   Procedure: Lower Extremity Angiography;  Surgeon: Lorretta Harp, MD;  Location: [place] [facility];  Service: Cardiovascular;  Laterality: N/A;  . SHOULDER SURGERY       Allergies  Allergies  Allergen Reactions  . Prednisone Other (See Comments)    States that this med makes him "crazy"  . Tetanus Toxoids Swelling and Other (See Comments)    Fever, Swelling of the arm   . Wellbutrin [Bupropion] Other (See Comments)    Crazy thoughts, nightmares  . Chantix [Varenicline] Other (  See Comments)    Dreams    History of Present Illness    Mr. Holzhauer is a 61 yo male with PMH of CAD s/p CABG (STEMI s/p POBA of LAD with IABP and subsequent emergent v5 CABG) in March 2017, ischemic cardiomyopathy, HTN and former tobacco abuse. He has had a complicated hx of CAD last year. He was admitted in 3/17 with STEMI and underwent 5v CABG. Admitted again in 8/17, underwent repeat cath after abnormal stress showing patent grafts expect for  SVG to diag that was occluded. Then again admitted in 10/17 with chest pain, repeat cath was unchanged. Last admitted 1/18 and underwent cath showing minimal change. EF was 50%, and medical therapy was recommended. Imdur and metoprolol were added to his medical regimen.   He was seen back in the office on 09/30/16 and reported doing very well since starting Imdur. No further episodes of chest pain. LDL was noted at goal with statin therapy.   He reports about a week ago he developed neurological symptoms. Began to have trouble remembering things. Works in [employer], physiological and started having difficulty doing simple calculations. Also trouble sleeping as night then being tired during the day. No speech difficulty but has felt "foggy". Thought it was relates to his Imdur and called the office. Stopped it about a week ago. Symptoms have not resolved, and began to have intermittent episodes of chest pain. Called the office today as he had an episode of intense chest pain this morning, noted to have slurred speech and trouble getting his words out. Instructed to call EMS to be taken to [facility].  In the ED his labs showed stable electrolytes, trop neg x1, Hgb 14.3. CXR neg. EKG showed SR with no acute ST/T wave changes. He was given a dose of IV fentanyl with relief of chest pain 2/10.   Inpatient Medications      Family History    Family History  Problem Relation Age of Onset  . Lung cancer Mother   . Heart Problems Father   . Heart attack Father 50  . Stroke Father   . Heart attack Maternal Grandmother   . Stroke Maternal Grandmother   . Heart attack Paternal Uncle   . Hypertension Brother   . Autoimmune disease Neg Hx     Social History    Social History   Social History  . Marital status: Married    Spouse name: [name]  . Number of children: 3  . Years of education: College   Occupational History  . Self-employed Self-Employed   Social History Main Topics  . Smoking  status: Former Smoker    Packs/day: 0.50    Types: Cigarettes    Quit date: 11/28/2015  . Smokeless tobacco: Never Used  . Alcohol use No  . Drug use: No  . Sexual activity: Not on file   Other Topics Concern  . Not on file   Social History Narrative   Patient lives at home with his spouse.   Caffeine Use: yes     Review of Systems    General:  No chills, fever, night sweats or weight changes.  Cardiovascular: See HPI Dermatological: No rash, lesions/masses Respiratory: No cough, dyspnea Urologic: No hematuria, dysuria Abdominal:   No nausea, vomiting, diarrhea, bright red blood per rectum, melena, or hematemesis Neurologic:  See HPI All other systems reviewed and are otherwise negative except as noted above.  Physical Exam    Blood pressure 102/67, pulse (!) 58, temperature  98.1 F (36.7 C), temperature source Oral, resp. rate 24, height '5\' 9"'$  (1.753 m), weight 185 lb (83.9 kg), SpO2 95 %.  General: Pleasant WM, NAD Psych: Normal affect. Neuro: Alert and oriented X 3. Moves all extremities spontaneously. HEENT: Normal  Neck: Supple without bruits or JVD. Lungs:  Resp regular and unlabored, CTA. Heart: RRR no s3, s4, or murmurs. Abdomen: Soft, non-tender, non-distended, BS + x 4.  Extremities: No clubbing, cyanosis or edema. DP/PT/Radials 2+ and equal bilaterally.  Labs    Troponin Resurgens [facility] of Care Test)  Recent Labs  11/02/16 1327  TROPIPOC 0.00    Recent Labs  11/02/16 1313  TROPONINI <0.03   Lab Results  Component Value Date   WBC 10.2 11/02/2016   HGB 14.3 11/02/2016   HCT 42.9 11/02/2016   MCV 92.3 11/02/2016   PLT 188 11/02/2016    Recent Labs Lab 11/02/16 1313  NA 138  K 4.7  CL 103  CO2 27  BUN 20  CREATININE 1.10  CALCIUM 9.6  GLUCOSE 95   Lab Results  Component Value Date   CHOL 130 09/30/2016   HDL 55 09/30/2016   LDLCALC 54 09/30/2016   TRIG 106 09/30/2016   Lab Results  Component Value Date   DDIMER 0.29 06/10/2016       Radiology Studies    Dg Chest 2 View  Result Date: 11/02/2016 CLINICAL DATA:  Left chest pain EXAM: CHEST  2 VIEW COMPARISON:  None. FINDINGS: There is no focal parenchymal opacity. There is no pleural effusion or pneumothorax. The heart size is normal. There is evidence of prior CABG. The osseous structures are unremarkable. IMPRESSION: No active cardiopulmonary disease. Electronically Signed   By: Kathreen Devoid   On: 11/02/2016 13:44    ECG & Cardiac Imaging    EKG: SR no acute ST/T wave changes  Echo: 5/17  Study Conclusions  - HPI and indications: Limited study. - Left ventricle: The cavity size was normal. Wall thickness was   normal. Systolic function was mildly reduced. The estimated   ejection fraction was in the range of 45% to 50%. Mild anterior   hypokinesis and incoordinate septal motion. The study is not   technically sufficient to allow evaluation of LV diastolic   function.  Impressions:  - Limited study. Compared to the most recent echo, the LVEF has   improved to 45-50% with residual anterior hypokinesis and   incoordinate septal motion.  Assessment & Plan    61 yo male with PMH of CAD s/p CABG (STEMI s/p POBA of LAD with IABP and subsequent emergent v5 CABG) in March 2017, ischemic cardiomyopathy, HTN and former tobacco abuse who presented with neurological complaints and chest pain.  1. Chest pain: Reports he has been doing very well after being placed on Imdur post cath in 1/18. Stopped this as he thought it was contributing to his neurological symptoms about a week ago and developed recurrent chest pain, and neurological symptoms continued. Last episode was this morning.  -- EKG non acute, trop neg x1.  -- resume Imdur at '15mg'$ , continue ASA, plavix, statin, ACEi -- cycle enzymes, but have a high  threshold for additional ischemic work up -- check echo to ensure no decline in EF.  2. Confusion/Slurred speech: Started about a week ago. Did not improve  after stopping Imdur. This morning noted to have slurred speech and difficulty with thoughts when on the phone with the office. No imaging has been done in the ED  yet. Would recommend CT head, as he also reports falling down 10 stairs about the same time symptoms began to occur.   3. HTN: reports being well controlled at home. Continue home regimen  4. HL: on statin, last LDL 54  Barnet Pall, NP-C Pager [phone] 11/02/2016, 3:00 PM  Attending Note:   The patient was seen and examined.  Agree with assessment and plan as noted above.  Changes made to the above note as needed.  Patient seen and independently examined with Reino Bellis, NP .   We discussed all aspects of the encounter. I agree with the assessment and plan as stated above.  1.  Chest pain :   Very minimal CP .  Is explained by his cessation of the Imdur ( which he did to see if his neuro issues / confusion would improve.) His neuro issues did not improve I would restart the Imdur either at 15 mg or 30 mg a day  He has been taking his BP at home  Can cycle troponins if he is admitted.   I do not think that he needs to be admitted for this CP as this is identical to his last 3 presentations .   We have demonstrated that Imdur helps with the pain and I would suspect that the pain will be controlled once he restarts the Imdur  He will see Dr Irish Lack in follow up   2. Neuro changes :   I think he needs further evaluation by Medicine or neuro. Could be done as OP if the initial eval in the ER is unremarkable.     I have spent a total of 40 minutes with patient reviewing hospital  notes , telemetry, EKGs, labs and examining patient as well as establishing an assessment and plan that was discussed with the patient. > 50% of time was spent in direct patient care.    Thayer Headings, Brooke Bonito., MD, [facility] 11/02/2016, 4:37 PM [address]  [place] Pager [phone]

## 2016-11-02 NOTE — ED Notes (Signed)
Patient transported to CT 

## 2016-11-02 NOTE — Telephone Encounter (Signed)
Pt is calling in to report to Dr Irish Lack that he is having gnawing chest pain rated on a scale of 8 out of 10.   Pt reports pain level is a 8, even with 2 nitroglycerin SL tabs on board.  Pt reports the chest pain is left-center in nature, with no radiation.  Pt reports mild SOB as well.  While talking to the pt on the phone, noted his speech was very slurred and he was unable to get his thoughts out.  Pt reports his current HR is 50 bpm, and pt stated he was unable to obtain a BP with his cuff.  Pt states that his chest pain is continuous.  Advised the pt that given his complaints of chest pain with no relief from Nitro and decreased HR, abnormal speech, he should refer to the ER for further evaluation.  Advised the pt not to drive, but call [phone].  Pt states he is going to call 911 now, for he feels as if he needs to go as well.  Pt has a neighbor nearby, while he waits on EMS to arrive.  Advised the pt to hold off on taking any more Nitro, until otherwise advised by EMS.  Informed the pt that Dr Irish Lack is out of the office today, but I will route this message to him, for his review.  Pt verbalized understanding and agrees with this plan.  Pt gracious for all the assistance provided.  Trish notified at 1145 of pt arriving to [facility] ER via EMS for chest pain evaluation, and slurred speech.

## 2016-11-02 NOTE — ED Triage Notes (Signed)
[facility]- with pt c/o chest pain starting at 1030. Pain at center of chest w/ rad to left arm. Nausea and SOB. Reports intense consistent pain 8/10. Hx of CABG last march and has had chest pain off and on since.  Pt took 324 ASA and 2 Nitro EMS gave 4 more Nitro and '4mg'$  Zofran. Vitals Stable.

## 2016-11-02 NOTE — ED Notes (Signed)
Pt called out for pain. EDP notified.

## 2016-11-02 NOTE — ED Notes (Signed)
Cardiology MD at bedside.

## 2016-11-02 NOTE — ED Notes (Signed)
Pt verbalized understanding of discharge instructions and denies any further questions at this time.   

## 2016-11-02 NOTE — ED Notes (Signed)
Pt now reports difficulty urinating and odor. Order has been entered for UA.  Pt also reports fall last week., Denies LOC but states "I don't know, I just fell."

## 2016-11-02 NOTE — ED Notes (Signed)
Patient transported to X-ray 

## 2016-11-03 ENCOUNTER — Other Ambulatory Visit: Payer: BC Managed Care – PPO

## 2016-11-09 ENCOUNTER — Emergency Department (HOSPITAL_COMMUNITY): Payer: BC Managed Care – PPO

## 2016-11-09 ENCOUNTER — Inpatient Hospital Stay (HOSPITAL_COMMUNITY)
Admission: EM | Admit: 2016-11-09 | Discharge: 2016-11-10 | DRG: 062 | Disposition: A | Discharge status: Home / Self Care | Payer: BC Managed Care – PPO | Attending: Neurology | Admitting: Neurology

## 2016-11-09 ENCOUNTER — Inpatient Hospital Stay (HOSPITAL_COMMUNITY): Payer: BC Managed Care – PPO

## 2016-11-09 ENCOUNTER — Encounter (HOSPITAL_COMMUNITY): Payer: Self-pay

## 2016-11-09 DIAGNOSIS — Z7982 Long term (current) use of aspirin: Secondary | ICD-10-CM | POA: Diagnosis not present

## 2016-11-09 DIAGNOSIS — E663 Overweight: Secondary | ICD-10-CM | POA: Diagnosis present

## 2016-11-09 DIAGNOSIS — I252 Old myocardial infarction: Secondary | ICD-10-CM

## 2016-11-09 DIAGNOSIS — Z801 Family history of malignant neoplasm of trachea, bronchus and lung: Secondary | ICD-10-CM | POA: Diagnosis not present

## 2016-11-09 DIAGNOSIS — Z79899 Other long term (current) drug therapy: Secondary | ICD-10-CM | POA: Diagnosis not present

## 2016-11-09 DIAGNOSIS — Z951 Presence of aortocoronary bypass graft: Secondary | ICD-10-CM | POA: Diagnosis not present

## 2016-11-09 DIAGNOSIS — Z823 Family history of stroke: Secondary | ICD-10-CM | POA: Diagnosis not present

## 2016-11-09 DIAGNOSIS — R531 Weakness: Secondary | ICD-10-CM | POA: Diagnosis present

## 2016-11-09 DIAGNOSIS — R29810 Facial weakness: Secondary | ICD-10-CM | POA: Diagnosis present

## 2016-11-09 DIAGNOSIS — K219 Gastro-esophageal reflux disease without esophagitis: Secondary | ICD-10-CM | POA: Diagnosis present

## 2016-11-09 DIAGNOSIS — Z6828 Body mass index (BMI) 28.0-28.9, adult: Secondary | ICD-10-CM | POA: Diagnosis not present

## 2016-11-09 DIAGNOSIS — I639 Cerebral infarction, unspecified: Secondary | ICD-10-CM

## 2016-11-09 DIAGNOSIS — I2510 Atherosclerotic heart disease of native coronary artery without angina pectoris: Secondary | ICD-10-CM | POA: Diagnosis present

## 2016-11-09 DIAGNOSIS — I110 Hypertensive heart disease with heart failure: Secondary | ICD-10-CM | POA: Diagnosis present

## 2016-11-09 DIAGNOSIS — Z7902 Long term (current) use of antithrombotics/antiplatelets: Secondary | ICD-10-CM

## 2016-11-09 DIAGNOSIS — G459 Transient cerebral ischemic attack, unspecified: Secondary | ICD-10-CM | POA: Diagnosis present

## 2016-11-09 DIAGNOSIS — E119 Type 2 diabetes mellitus without complications: Secondary | ICD-10-CM | POA: Diagnosis present

## 2016-11-09 DIAGNOSIS — I5022 Chronic systolic (congestive) heart failure: Secondary | ICD-10-CM | POA: Diagnosis present

## 2016-11-09 DIAGNOSIS — I6789 Other cerebrovascular disease: Secondary | ICD-10-CM | POA: Diagnosis not present

## 2016-11-09 DIAGNOSIS — F419 Anxiety disorder, unspecified: Secondary | ICD-10-CM | POA: Diagnosis present

## 2016-11-09 DIAGNOSIS — Z7951 Long term (current) use of inhaled steroids: Secondary | ICD-10-CM | POA: Diagnosis not present

## 2016-11-09 DIAGNOSIS — E785 Hyperlipidemia, unspecified: Secondary | ICD-10-CM | POA: Diagnosis present

## 2016-11-09 DIAGNOSIS — R413 Other amnesia: Secondary | ICD-10-CM | POA: Diagnosis not present

## 2016-11-09 DIAGNOSIS — Z87891 Personal history of nicotine dependence: Secondary | ICD-10-CM | POA: Diagnosis not present

## 2016-11-09 DIAGNOSIS — Z8249 Family history of ischemic heart disease and other diseases of the circulatory system: Secondary | ICD-10-CM

## 2016-11-09 DIAGNOSIS — I255 Ischemic cardiomyopathy: Secondary | ICD-10-CM | POA: Diagnosis present

## 2016-11-09 DIAGNOSIS — R2990 Unspecified symptoms and signs involving the nervous system: Secondary | ICD-10-CM | POA: Diagnosis present

## 2016-11-09 DIAGNOSIS — I6330 Cerebral infarction due to thrombosis of unspecified cerebral artery: Secondary | ICD-10-CM

## 2016-11-09 DIAGNOSIS — I25110 Atherosclerotic heart disease of native coronary artery with unstable angina pectoris: Secondary | ICD-10-CM | POA: Diagnosis present

## 2016-11-09 DIAGNOSIS — I25119 Atherosclerotic heart disease of native coronary artery with unspecified angina pectoris: Secondary | ICD-10-CM | POA: Diagnosis present

## 2016-11-09 HISTORY — DX: Essential (primary) hypertension: I10

## 2016-11-09 LAB — CBC
HCT: 42.6 % (ref 39.0–52.0)
Hemoglobin: 14 g/dL (ref 13.0–17.0)
MCH: 30.2 pg (ref 26.0–34.0)
MCHC: 32.9 g/dL (ref 30.0–36.0)
MCV: 91.8 fL (ref 78.0–100.0)
Platelets: 177 10*3/uL (ref 150–400)
RBC: 4.64 MIL/uL (ref 4.22–5.81)
RDW: 13 % (ref 11.5–15.5)
WBC: 8 10*3/uL (ref 4.0–10.5)

## 2016-11-09 LAB — ECHOCARDIOGRAM COMPLETE: Weight: 3040.58 oz

## 2016-11-09 LAB — DIFFERENTIAL
Basophils Absolute: 0 10*3/uL (ref 0.0–0.1)
Basophils Relative: 0 %
Eosinophils Absolute: 0.1 10*3/uL (ref 0.0–0.7)
Eosinophils Relative: 2 %
Lymphocytes Relative: 22 %
Lymphs Abs: 1.8 10*3/uL (ref 0.7–4.0)
Monocytes Absolute: 0.5 10*3/uL (ref 0.1–1.0)
Monocytes Relative: 6 %
Neutro Abs: 5.5 10*3/uL (ref 1.7–7.7)
Neutrophils Relative %: 70 %

## 2016-11-09 LAB — URINALYSIS, ROUTINE W REFLEX MICROSCOPIC
Bilirubin Urine: NEGATIVE
Glucose, UA: NEGATIVE mg/dL
Hgb urine dipstick: NEGATIVE
Ketones, ur: NEGATIVE mg/dL
Leukocytes, UA: NEGATIVE
Nitrite: NEGATIVE
Protein, ur: NEGATIVE mg/dL
Specific Gravity, Urine: 1.017 (ref 1.005–1.030)
pH: 5 (ref 5.0–8.0)

## 2016-11-09 LAB — I-STAT CHEM 8, ED
BUN: 38 mg/dL — ABNORMAL HIGH (ref 6–20)
Calcium, Ion: 1.16 mmol/L (ref 1.15–1.40)
Chloride: 105 mmol/L (ref 101–111)
Creatinine, Ser: 1.1 mg/dL (ref 0.61–1.24)
Glucose, Bld: 101 mg/dL — ABNORMAL HIGH (ref 65–99)
HCT: 43 % (ref 39.0–52.0)
Hemoglobin: 14.6 g/dL (ref 13.0–17.0)
Potassium: 4.6 mmol/L (ref 3.5–5.1)
Sodium: 140 mmol/L (ref 135–145)
TCO2: 27 mmol/L (ref 0–100)

## 2016-11-09 LAB — RAPID URINE DRUG SCREEN, HOSP PERFORMED
Amphetamines: NOT DETECTED
Barbiturates: NOT DETECTED
Benzodiazepines: POSITIVE — AB
Cocaine: NOT DETECTED
Opiates: NOT DETECTED
Tetrahydrocannabinol: NOT DETECTED

## 2016-11-09 LAB — COMPREHENSIVE METABOLIC PANEL
ALT: 15 U/L — ABNORMAL LOW (ref 17–63)
AST: 20 U/L (ref 15–41)
Albumin: 4.4 g/dL (ref 3.5–5.0)
Alkaline Phosphatase: 69 U/L (ref 38–126)
Anion gap: 9 (ref 5–15)
BUN: 25 mg/dL — ABNORMAL HIGH (ref 6–20)
CO2: 24 mmol/L (ref 22–32)
Calcium: 9.4 mg/dL (ref 8.9–10.3)
Chloride: 105 mmol/L (ref 101–111)
Creatinine, Ser: 1.12 mg/dL (ref 0.61–1.24)
GFR calc Af Amer: >60 mL/min (ref >60)
GFR calc non Af Amer: >60 mL/min (ref >60)
Glucose, Bld: 104 mg/dL — ABNORMAL HIGH (ref 65–99)
Potassium: 4.5 mmol/L (ref 3.5–5.1)
Sodium: 138 mmol/L (ref 135–145)
Total Bilirubin: 0.8 mg/dL (ref 0.3–1.2)
Total Protein: 7.1 g/dL (ref 6.5–8.1)

## 2016-11-09 LAB — MRSA PCR SCREENING: MRSA by PCR: NEGATIVE

## 2016-11-09 LAB — I-STAT TROPONIN, ED: Troponin i, poc: 0 ng/mL (ref 0.00–0.08)

## 2016-11-09 LAB — APTT: aPTT: 45 seconds — ABNORMAL HIGH (ref 24–36)

## 2016-11-09 LAB — GLUCOSE, CAPILLARY: Glucose-Capillary: 121 mg/dL — ABNORMAL HIGH (ref 65–99)

## 2016-11-09 LAB — ETHANOL: Alcohol, Ethyl (B): <5 mg/dL (ref <5)

## 2016-11-09 LAB — PROTIME-INR
INR: 0.99
Prothrombin Time: 13.1 seconds (ref 11.4–15.2)

## 2016-11-09 MED ORDER — FLUOXETINE HCL 20 MG PO CAPS
20.0000 mg | ORAL_CAPSULE | Freq: Every day | ORAL | Status: DC
  Administered 2016-11-09 – 2016-11-10 (×2): 20 mg via ORAL
  Filled 2016-11-09 (×2): qty 1

## 2016-11-09 MED ORDER — ACETAMINOPHEN 325 MG PO TABS
650.0000 mg | ORAL_TABLET | ORAL | Status: DC | PRN
  Administered 2016-11-10 (×2): 650 mg via ORAL
  Filled 2016-11-09 (×2): qty 2

## 2016-11-09 MED ORDER — EZETIMIBE 10 MG PO TABS
10.0000 mg | ORAL_TABLET | Freq: Every day | ORAL | Status: DC
  Administered 2016-11-09 – 2016-11-10 (×2): 10 mg via ORAL
  Filled 2016-11-09 (×2): qty 1

## 2016-11-09 MED ORDER — IOPAMIDOL (ISOVUE-370) INJECTION 76%
INTRAVENOUS | Status: AC
  Administered 2016-11-09: 50 mL
  Filled 2016-11-09: qty 50

## 2016-11-09 MED ORDER — PANTOPRAZOLE SODIUM 40 MG IV SOLR
40.0000 mg | Freq: Every day | INTRAVENOUS | Status: DC
  Administered 2016-11-09: 40 mg via INTRAVENOUS

## 2016-11-09 MED ORDER — IOPAMIDOL (ISOVUE-370) INJECTION 76%
INTRAVENOUS | Status: AC
Start: 2016-11-09 — End: 2016-11-10
  Filled 2016-11-09: qty 50

## 2016-11-09 MED ORDER — FLUTICASONE PROPIONATE 50 MCG/ACT NA SUSP
2.0000 | Freq: Every day | NASAL | Status: DC
  Administered 2016-11-10: 2 via NASAL
  Filled 2016-11-09 (×2): qty 16

## 2016-11-09 MED ORDER — ADULT MULTIVITAMIN W/MINERALS CH
1.0000 | ORAL_TABLET | Freq: Every day | ORAL | Status: DC
  Administered 2016-11-09 – 2016-11-10 (×2): 1 via ORAL
  Filled 2016-11-09 (×2): qty 1

## 2016-11-09 MED ORDER — SODIUM CHLORIDE 0.9 % IV SOLN
INTRAVENOUS | Status: DC
  Administered 2016-11-09 – 2016-11-10 (×2): via INTRAVENOUS

## 2016-11-09 MED ORDER — ACETAMINOPHEN 160 MG/5ML PO SOLN: 650.0000 mg | ORAL | Status: DC | PRN

## 2016-11-09 MED ORDER — ALTEPLASE (STROKE) FULL DOSE INFUSION
0.9000 mg/kg | Freq: Once | INTRAVENOUS | Status: AC
  Administered 2016-11-09: 78 mg via INTRAVENOUS

## 2016-11-09 MED ORDER — CHLORDIAZEPOXIDE HCL 5 MG PO CAPS
10.0000 mg | ORAL_CAPSULE | Freq: Every day | ORAL | Status: DC | PRN
  Administered 2016-11-10 (×2): 10 mg via ORAL
  Filled 2016-11-09 (×4): qty 2

## 2016-11-09 MED ORDER — STROKE: EARLY STAGES OF RECOVERY BOOK
Freq: Once | Status: DC
  Filled 2016-11-09 (×2): qty 1

## 2016-11-09 MED ORDER — ACETAMINOPHEN 650 MG RE SUPP: 650.0000 mg | RECTAL | Status: DC | PRN

## 2016-11-09 MED ORDER — FAMOTIDINE 10 MG PO TABS
10.0000 mg | ORAL_TABLET | Freq: Every day | ORAL | Status: DC
  Administered 2016-11-09: 10 mg via ORAL
  Filled 2016-11-09 (×2): qty 1

## 2016-11-09 MED ORDER — ISOSORBIDE MONONITRATE ER 30 MG PO TB24
30.0000 mg | ORAL_TABLET | Freq: Every day | ORAL | Status: DC
  Administered 2016-11-09 – 2016-11-10 (×2): 30 mg via ORAL
  Filled 2016-11-09 (×3): qty 1

## 2016-11-09 NOTE — ED Notes (Signed)
[employer] sales representative given report

## 2016-11-09 NOTE — ED Triage Notes (Signed)
Pt presents for evaluation of altered mental status/confusion, headache, blurred vision, dizziness starting around 0900. Pt reports has been happening intermittently x few weeks. Pt AxO x4. States he feels out of control.

## 2016-11-09 NOTE — ED Notes (Signed)
Pt in CT. Dr. Wendee Beavers at bedside. Tpa ordered and initiated in CT with this RN and Josh with stroke team.

## 2016-11-09 NOTE — Progress Notes (Signed)
  Echocardiogram 2D Echocardiogram has been performed.  Donata Clay 11/09/2016, 4:28 PM

## 2016-11-09 NOTE — ED Provider Notes (Signed)
[facility] DEPT Provider Note   CSN: [id number] Arrival date & time: 11/09/16  4235     History   Chief Complaint Chief Complaint  Patient presents with  . Altered Mental Status    HPI Shyhiem Beeney is a 61 y.o. male.  61 yo M with a chief complaint of not feeling right. Patient states that he was working from home and was having difficulty completing a task on computer. He'll keep Doing the same thing over and over again was unable to correct this action. Told his wife that he wasn't feeling well but was having trouble expressing himself. This all started about an hour ago. Denies any recent head injury. Was just seen in the ED a few days ago with a head CT that was negative. Has been changing some chronic chest pain medications but denies recent change. Denies any other medications. Denies chest pain.   The history is provided by the patient and the spouse.  Altered Mental Status   This is a new problem. The current episode started 1 to 2 hours ago. The problem has been gradually worsening. Associated symptoms include weakness. Pertinent negatives include no confusion. His past medical history does not include head trauma.  Illness  This is a new problem. The current episode started less than 1 hour ago. The problem occurs constantly. The problem has not changed since onset.Pertinent negatives include no chest pain, no abdominal pain, no headaches and no shortness of breath. Nothing aggravates the symptoms. Nothing relieves the symptoms. He has tried nothing for the symptoms. The treatment provided no relief.    Past Medical History:  Diagnosis Date  . Acute hepatitis C without mention of hepatic coma(070.51)    Tx by [facility] for Hep.  negative for hep c 2/15.  Marland Kitchen Anxiety   . CAD (coronary artery disease)    a. 10/2015 ant STEMI >> LHC with 3 v CAD; oLAD tx with POBA >> emergent CABG.  . Chronic systolic CHF (congestive heart failure) ([place])   . Colitis   . Dyspnea   .  Esophageal reflux    eosinophil esophagitis  . Former tobacco use   . Ischemic cardiomyopathy    a. EF 25-30% at intraop TEE 4/17  //  b. Limited Echo 5/17 - EF 45-50%, mild ant HK  . Sinus bradycardia    a. HR dropping into 40s in 02/2016 -> BB reduced.  . Symptomatic hypotension    a. 02/2016 ER visit -> meds reduced.    Patient Active Problem List   Diagnosis Date Noted  . Cerebral thrombosis with cerebral infarction 11/09/2016  . 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 Unstable angina ([place]) 09/07/2016  . Claudication of both lower extremities ([place])   . Pure hypercholesterolemia   . Tobacco abuse disorder   . Coronary artery disease involving coronary bypass graft of native heart with unstable angina pectoris ([place])   . Chest pain 06/10/2016  . Abnormal nuclear stress test - HIGH RISK 04/20/2016  . ST elevation myocardial infarction (STEMI) of anterior wall, subsequent episode of care ([place])   . Essential hypertension 02/26/2016  . Ischemic cardiomyopathy 12/25/2015  . Dyslipidemia, goal LDL below 70 12/25/2015  . STEMI (ST elevation myocardial infarction) ([place]) 11/28/2015  . 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 Coronary artery disease involving native coronary artery 11/28/2015  . 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 Acute MI anterior wall first episode care [facility])   . Chest pain with high risk for  cardiac etiology 03/07/2015  . Dysphagia 03/07/2015  . Gout attack 03/07/2015  . Mixed bipolar I disorder ([place]) 03/07/2015  . Fibromyalgia 07/09/2014  . Gout 07/09/2014  . Anxiety 07/09/2014  . Depression 07/09/2014  . Hepatitis C 11/20/2012  . Eosinophilic esophagitis 18/84/1660    Past Surgical History:  Procedure Laterality Date  . CARDIAC CATHETERIZATION N/A 11/28/2015   Procedure: Left Heart Cath and Coronary Angiography;  Surgeon: [name], MD;  Location: [place] CV LAB;  Service: Cardiovascular;  Laterality: N/A;  . CARDIAC CATHETERIZATION N/A 11/28/2015    Procedure: Coronary Balloon Angioplasty;  Surgeon: [name], MD;  Location: [place] CV LAB;  Service: Cardiovascular;  Laterality: N/A;  ostial LAD  . CARDIAC CATHETERIZATION N/A 11/28/2015   Procedure: Coronary/Graft Angiography;  Surgeon: [name], MD;  Location: [place] CV LAB;  Service: Cardiovascular;  Laterality: N/A;  coronaries only   . CARDIAC CATHETERIZATION N/A 04/21/2016   Procedure: Left Heart Cath and Coronary Angiography;  Surgeon: Wellington Hampshire, MD;  Location: [place] CV LAB;  Service: Cardiovascular;  Laterality: N/A;  . CARDIAC CATHETERIZATION N/A 06/14/2016   Procedure: Left Heart Cath and Cors/Grafts Angiography;  Surgeon: Lorretta Harp, MD;  Location: [place] CV LAB;  Service: Cardiovascular;  Laterality: N/A;  . CARDIAC CATHETERIZATION N/A 09/08/2016   Procedure: Left Heart Cath and Cors/Grafts Angiography;  Surgeon: Wellington Hampshire, MD;  Location: [place] CV LAB;  Service: Cardiovascular;  Laterality: N/A;  . CORONARY ARTERY BYPASS GRAFT N/A 11/28/2015   Procedure: CORONARY ARTERY BYPASS GRAFTING (CABG) TIMES FIVE USING LEFT INTERNAL MAMMARY ARTERY AND RIGHT GREATER SAPHENOUS,VIEN HARVEATED BY ENDOVIEN, INTRAOPPRATIVE TEE;  Surgeon: Gaye Pollack, MD;  Location: [place];  Service: Open Heart Surgery;  Laterality: N/A;  . PERIPHERAL VASCULAR CATHETERIZATION N/A 06/14/2016   Procedure: Lower Extremity Angiography;  Surgeon: Lorretta Harp, MD;  Location: [place] CV LAB;  Service: Cardiovascular;  Laterality: N/A;  . SHOULDER SURGERY         Home Medications    Prior to Admission medications   Medication Sig Start Date End Date Taking? Authorizing Provider  aluminum hydroxide-magnesium carbonate (GAVISCON) 95-358 MG/15ML SUSP Take 30 mLs by mouth as needed for indigestion or heartburn.   Yes Historical Provider, MD  aspirin 81 MG tablet Take 81 mg by mouth daily.   Yes Historical Provider, MD  atorvastatin (LIPITOR) 40  MG tablet Take 1 tablet (40 mg total) by mouth daily. Patient taking differently: Take 80 mg by mouth daily.  08/12/16 11/10/16 Yes [name], MD  chlordiazePOXIDE (LIBRIUM) 10 MG capsule Take 10 mg by mouth daily as needed for anxiety.    Yes Historical Provider, MD  clopidogrel (PLAVIX) 75 MG tablet Take 75 mg by mouth daily.   Yes Historical Provider, MD  DM-Doxylamine-Acetaminophen (NYQUIL COLD & FLU PO) Take 1 capsule by mouth at bedtime as needed (cold symptoms).   Yes Historical Provider, MD  ezetimibe (ZETIA) 10 MG tablet Take 1 tablet (10 mg total) by mouth daily. 08/12/16 11/10/16 Yes [name], MD  FLUoxetine (PROZAC) 20 MG tablet Take 20 mg by mouth daily. 11/01/16  Yes Historical Provider, MD  fluticasone (FLONASE) 50 MCG/ACT nasal spray Place 2 sprays into both nostrils daily.   Yes Historical Provider, MD  isosorbide mononitrate (IMDUR) 30 MG 24 hr tablet Take 1 tablet (30 mg total) by mouth daily. Patient taking differently: Take 30 mg by mouth daily.  09/10/16  Yes Erin  Christain Sacramento, NP  lisinopril (PRINIVIL,ZESTRIL) 5 MG tablet Take 1 tablet daily. May take additional tablet once daily for SBP greater than 150 one hour after taking daily dose. 09/01/16  Yes [name], MD  Multiple Vitamins-Minerals (ONE-A-DAY MENS 50+ ADVANTAGE PO) Take 1 tablet by mouth daily.    Yes Historical Provider, MD  nitroGLYCERIN (NITROSTAT) 0.4 MG SL tablet Place 0.4 mg under the tongue every 5 (five) minutes as needed for chest pain. 3 DOSE MAX   Yes Historical Provider, MD  ranitidine (ZANTAC) 150 MG tablet Take 150 mg by mouth daily as needed for heartburn.    Yes Historical Provider, MD  zolpidem (AMBIEN) 10 MG tablet Take 10 mg by mouth at bedtime as needed for sleep. Reported on 02/09/2016 11/26/15  Yes Historical Provider, MD  metoprolol tartrate (LOPRESSOR) 25 MG tablet Take 0.5 tablets (12.5 mg total) by mouth 2 (two) times daily. Patient taking differently: Take 25 mg by mouth 2  (two) times daily.  09/09/16   [name], NP    Family History Family History  Problem Relation Age of Onset  . Lung cancer Mother   . Heart Problems Father   . Heart attack Father 23  . Stroke Father   . Heart attack Maternal Grandmother   . Stroke Maternal Grandmother   . Heart attack Paternal Uncle   . Hypertension Brother   . Autoimmune disease Neg Hx     Social History Social History  Substance Use Topics  . Smoking status: Former Smoker    Packs/day: 0.50    Types: Cigarettes    Quit date: 11/28/2015  . Smokeless tobacco: Never Used  . Alcohol use No     Allergies   Prednisone; Tetanus toxoids; Wellbutrin [bupropion]; and Chantix [varenicline]   Review of Systems Review of Systems  Constitutional: Negative for chills and fever.  HENT: Negative for congestion and facial swelling.   Eyes: Negative for discharge and visual disturbance.  Respiratory: Negative for shortness of breath.   Cardiovascular: Negative for chest pain and palpitations.  Gastrointestinal: Negative for abdominal pain, diarrhea and vomiting.  Musculoskeletal: Negative for arthralgias and myalgias.  Skin: Negative for color change and rash.  Neurological: Positive for dizziness, weakness and light-headedness. Negative for tremors, syncope and headaches.  Psychiatric/Behavioral: Negative for confusion and dysphoric mood.     Physical Exam Updated Vital Signs BP 104/65   Pulse (!) 59   Temp 97.6 F (36.4 C)   Resp 17   Wt 190 lb 0.6 oz (86.2 kg)   SpO2 97%   BMI 28.06 kg/m   Physical Exam  Constitutional: He is oriented to person, place, and time. He appears well-developed and well-nourished.  HENT:  Head: Normocephalic and atraumatic.  Eyes: EOM are normal. Pupils are equal, round, and reactive to light.  Neck: Normal range of motion. Neck supple. No JVD present.  Cardiovascular: Normal rate and regular rhythm.  Exam reveals no gallop and no friction rub.   No murmur  heard. Pulmonary/Chest: No respiratory distress. He has no wheezes.  Abdominal: He exhibits no distension and no mass. There is no tenderness. There is no rebound and no guarding.  Musculoskeletal: Normal range of motion.  Neurological: He is alert and oriented to person, place, and time.  Reflex Scores:      Patellar reflexes are 2+ on the right side and 3+ on the left side.      Achilles reflexes are 2+ on the right side and 3+ on  the left side. Left shoulder shrug weakness compared to right 4 out of 5. Left lower extremity 4 out of 5 compared to right 5 out of 5. Mild difficulty with word finding. Left sided facial droop.  Skin: No rash noted. No pallor.  Psychiatric: He has a normal mood and affect. His behavior is normal.  Nursing note and vitals reviewed.    ED Treatments / Results  Labs (all labs ordered are listed, but only abnormal results are displayed) Labs Reviewed  APTT - Abnormal; Notable for the following:       Result Value   aPTT 45 (*)    All other components within normal limits  COMPREHENSIVE METABOLIC PANEL - Abnormal; Notable for the following:    Glucose, Bld 104 (*)    BUN 25 (*)    ALT 15 (*)    All other components within normal limits  RAPID URINE DRUG SCREEN, HOSP PERFORMED - Abnormal; Notable for the following:    Benzodiazepines POSITIVE (*)    All other components within normal limits  I-STAT CHEM 8, ED - Abnormal; Notable for the following:    BUN 38 (*)    Glucose, Bld 101 (*)    All other components within normal limits  MRSA PCR SCREENING  ETHANOL  PROTIME-INR  CBC  DIFFERENTIAL  URINALYSIS, ROUTINE W REFLEX MICROSCOPIC  HIV ANTIBODY (ROUTINE TESTING)  I-STAT TROPOININ, ED    EKG  EKG Interpretation None       Radiology Ct Head Code Stroke W/o Cm  Result Date: 11/09/2016 CLINICAL DATA:  Code stroke. Left facial droop and left-sided weakness. EXAM: CT HEAD WITHOUT CONTRAST TECHNIQUE: Contiguous axial images were obtained from  the base of the skull through the vertex without intravenous contrast. COMPARISON:  11/02/2016 FINDINGS: Brain: There is no evidence of acute cortical infarct, intracranial hemorrhage, mass, midline shift, or extra-axial fluid collection. Mild cerebral atrophy is within normal limits for age. Vascular: Mild atherosclerotic calcification at the skullbase. No hyperdense vessel. Skull: No fracture or focal osseous lesion. Sinuses/Orbits: No significant sinus disease.  Unremarkable orbits. Other: None. ASPECTS [facility] Stroke Program Early CT Score) - Ganglionic level infarction (caudate, lentiform nuclei, internal capsule, insula, M1-M3 cortex): 7 - Supraganglionic infarction (M4-M6 cortex): 3 Total score (0-10 with 10 being normal): 10 IMPRESSION: 1. No evidence of acute intracranial abnormality. 2. ASPECTS is 10. Results sent via page to Dr. Wendee Beavers on 11/09/2016 at 10:38 a.m. Electronically Signed   By: Logan Bores M.D.   On: 11/09/2016 10:39    Procedures Procedures (including critical care time)  Medications Ordered in ED Medications   stroke: mapping our early stages of recovery book (not administered)  acetaminophen (TYLENOL) tablet 650 mg (not administered)    Or  acetaminophen (TYLENOL) solution 650 mg (not administered)    Or  acetaminophen (TYLENOL) suppository 650 mg (not administered)  pantoprazole (PROTONIX) injection 40 mg (not administered)  chlordiazePOXIDE (LIBRIUM) capsule 10 mg (not administered)  ezetimibe (ZETIA) tablet 10 mg (10 mg Oral Given 11/09/16 1315)  FLUoxetine (PROZAC) capsule 20 mg (20 mg Oral Given 11/09/16 1314)  fluticasone (FLONASE) 50 MCG/ACT nasal spray 2 spray (2 sprays Each Nare Not Given 11/09/16 1316)  isosorbide mononitrate (IMDUR) 24 hr tablet 30 mg (30 mg Oral Given 11/09/16 1315)  multivitamin with minerals tablet 1 tablet (1 tablet Oral Given 11/09/16 1314)  famotidine (PEPCID) tablet 10 mg (10 mg Oral Given 11/09/16 1315)  alteplase (ACTIVASE) 1 mg/mL  infusion 78 mg (0 mg Intravenous Stopped  11/09/16 1139)     Initial Impression / Assessment and Plan / ED Course  I have reviewed the triage vital signs and the nursing notes.  Pertinent labs & imaging results that were available during my care of the patient were reviewed by me and considered in my medical decision making (see chart for details).     61 yo M With a chief complaint of acute onset lightheadedness difficulty with word finding and left-sided weakness. On my exam he was made a code stroke.  Seen by neurology, given TPA. Admit to ICU.  CRITICAL CARE Performed by: Cecilio Asper   Total critical care time: 45 minutes  Critical care time was exclusive of separately billable procedures and treating other patients.  Critical care was necessary to treat or prevent imminent or life-threatening deterioration.  Critical care was time spent personally by me on the following activities: development of treatment plan with patient and/or surrogate as well as nursing, discussions with consultants, evaluation of patient's response to treatment, examination of patient, obtaining history from patient or surrogate, ordering and performing treatments and interventions, ordering and review of laboratory studies, ordering and review of radiographic studies, pulse oximetry and re-evaluation of patient's condition.  The patients results and plan were reviewed and discussed.   Any x-rays performed were independently reviewed by myself.   Differential diagnosis were considered with the presenting HPI.  Medications   stroke: mapping our early stages of recovery book (not administered)  acetaminophen (TYLENOL) tablet 650 mg (not administered)    Or  acetaminophen (TYLENOL) solution 650 mg (not administered)    Or  acetaminophen (TYLENOL) suppository 650 mg (not administered)  pantoprazole (PROTONIX) injection 40 mg (not administered)  chlordiazePOXIDE (LIBRIUM) capsule 10 mg (not  administered)  ezetimibe (ZETIA) tablet 10 mg (10 mg Oral Given 11/09/16 1315)  FLUoxetine (PROZAC) capsule 20 mg (20 mg Oral Given 11/09/16 1314)  fluticasone (FLONASE) 50 MCG/ACT nasal spray 2 spray (2 sprays Each Nare Not Given 11/09/16 1316)  isosorbide mononitrate (IMDUR) 24 hr tablet 30 mg (30 mg Oral Given 11/09/16 1315)  multivitamin with minerals tablet 1 tablet (1 tablet Oral Given 11/09/16 1314)  famotidine (PEPCID) tablet 10 mg (10 mg Oral Given 11/09/16 1315)  alteplase (ACTIVASE) 1 mg/mL infusion 78 mg (0 mg Intravenous Stopped 11/09/16 1139)    Vitals:   11/09/16 1300 11/09/16 1315 11/09/16 1330 11/09/16 1400  BP: 111/65 106/69 114/71 104/65  Pulse: 65 61 (!) 55 (!) 59  Resp: 17 (!) '8 13 17  '$ Temp: 97.6 F (36.4 C)     TempSrc:      SpO2: 99% 100% 100% 97%  Weight:        Final diagnoses:  Stroke (cerebrum) ([place])    Admission/ observation were discussed with the admitting physician, patient and/or family and they are comfortable with the plan.   Final Clinical Impressions(s) / ED Diagnoses   Final diagnoses:  Stroke (cerebrum) [facility])    New Prescriptions Current Discharge Medication List       Deno Etienne, DO 11/09/16 1416

## 2016-11-09 NOTE — H&P (Signed)
H&P Referring Physician: ED  CC: difficulty performing taks and left arm and leg weakness  History is obtained from:patient  HPI: [name] is a 61 y.o. male with hx of MI on asa and plavix who comes into the hospital for above complaints.  At 9am today started to have difficulty writing (he is left handed) and thinking.  He then felt some numbness and weakness of the left side.  Leg > arm.  Says he has had similar confusion spells in th past but never involving one sided motor or sensory complaints.   LKW: 9am tpa given?: yes  ROS: A 14 point ROS was performed and is negative except as noted in the HPI.  Past Medical History:  Diagnosis Date  . Acute hepatitis C without mention of hepatic coma(070.51)    Tx by [facility] for Hep.  negative for hep c 2/15.  Marland Kitchen Anxiety   . CAD (coronary artery disease)    a. 10/2015 ant STEMI >> LHC with 3 v CAD; oLAD tx with POBA >> emergent CABG.  . Chronic systolic CHF (congestive heart failure) ([place])   . Colitis   . Dyspnea   . Esophageal reflux    eosinophil esophagitis  . Former tobacco use   . Ischemic cardiomyopathy    a. EF 25-30% at intraop TEE 4/17  //  b. Limited Echo 5/17 - EF 45-50%, mild ant HK  . Sinus bradycardia    a. HR dropping into 40s in 02/2016 -> BB reduced.  . Symptomatic hypotension    a. 02/2016 ER visit -> meds reduced.    Family History  Problem Relation Age of Onset  . Lung cancer Mother   . Heart Problems Father   . Heart attack Father 61  . Stroke Father   . Heart attack Maternal Grandmother   . Stroke Maternal Grandmother   . Heart attack Paternal Uncle   . Hypertension Brother   . Autoimmune disease Neg Hx     Social History:  reports that he quit smoking about a year ago. His smoking use included Cigarettes. He smoked 0.50 packs per day. He has never used smokeless tobacco. He reports that he does not drink alcohol or use drugs.  Exam: Current vital signs: BP 119/76   Pulse 64   Temp 98.4 F (36.9  C)   Resp 14   Wt 86.2 kg (190 lb 0.6 oz)   SpO2 100%   BMI 28.06 kg/m  Vital signs in last 24 hours: Temp:  [98.4 F (36.9 C)-98.7 F (37.1 C)] 98.4 F (36.9 C) (03/13 1100) Pulse Rate:  [53-80] 64 (03/13 1101) Resp:  [12-22] 14 (03/13 1101) BP: (119-131)/(66-93) 119/76 (03/13 1100) SpO2:  [98 %-100 %] 100 % (03/13 1101) Weight:  [86.2 kg (190 lb 0.6 oz)] 86.2 kg (190 lb 0.6 oz) (03/13 1000)   Physical Exam  Constitutional: Appears well-developed and well-nourished.  Psych: Affect appropriate to situation Eyes: No scleral injection HENT: No OP obstrucion Head: Normocephalic.  Cardiovascular: Normal rate and regular rhythm.  Respiratory: Effort normal and breath sounds normal to anterior ascultation GI: Soft.  No distension. There is no tenderness.  Skin: WDI  Neuro: Mental Status: Patient is awake, alert, oriented to person, place, month, year, and situation Patient is able to give a clear and coherent history No signs of aphasia or neglect Cranial Nerves: II: Visual Fields are full. Pupils are equal, round, and reactive to light. III,IV, VI: EOMI without ptosis or diploplia.  V: Facial  sensation is symmetric to temperature VII: Facial movement is mildly asymmetric.  VIII: hearing is intact to voice X: Uvula elevates symmetrically XI: Shoulder shrug is symmetric. XII: tongue is deviates right Motor: Tone is normal. Bulk is normal. 5/5 strength was present in all four extremities, except for 4/5 left leg and 5-/5 left arm Sensory: Sensation is decreased in the left side Deep Tendon Reflexes: 2+ and symmetric in the biceps and patellae. Plantars: Toes are equivocal Cerebellar: FNF and HKS are intact bilaterally  I have reviewed labs in epic and the results pertinent to this consultation are:  I have reviewed the images obtained  Impression: This is a 61 yo with multiple stroke risk factors, who presents with apraxia and left sided weakness.  Pt is left  handed.  As pt was within the tPA window he received ivTPA.  All riks and benefits were explained to pt including risk of lethal intracranial bleeding; pt agreed to be treated.  NIHSS = 3 afecting leg 1 arm 1 and sensory 1.  Will admit to unit for post tPA care.  Will stop his antiplatelets and place on SCDs for DVT prophylaxis.  WIll order MRI.  Stroke service will continue to follow after today.  Recommendations: 1) as above

## 2016-11-09 NOTE — Code Documentation (Signed)
61 y.o. Male arrives to [facility] ED via POV w/ c/o of disorganized thinking. W/ PMHx of anxiety, CHF and CAD. The pt arrived to work this morning and noticed he was having a difficult time organizing his thoughts and getting his speech out. He was then brought to [facility] ED. In triage the pt was noted to have a slight left sided facial droop and left sided hemiparesis. Code stroke was called. Pt taken to CT. CT (-) w/ ASPECTS 10. NIHSS 3. See EMR for NIHSS and code stroke times. Pt with left sided facial droop, LLE drift and decreased sensation on the left side. Full dose tPA given at 1039. '78mg'$  total dose given with '8mg'$  given as bolus. Pt taken back to ED. Wife at bedside reporting patient has had disorganized thinking for the last six months. However recently has gotten worse, promoting ED visit today.  Bedside handoff with ED RN [name].

## 2016-11-10 ENCOUNTER — Inpatient Hospital Stay (HOSPITAL_COMMUNITY): Payer: BC Managed Care – PPO

## 2016-11-10 DIAGNOSIS — Z823 Family history of stroke: Secondary | ICD-10-CM

## 2016-11-10 DIAGNOSIS — E785 Hyperlipidemia, unspecified: Secondary | ICD-10-CM

## 2016-11-10 DIAGNOSIS — I2510 Atherosclerotic heart disease of native coronary artery without angina pectoris: Secondary | ICD-10-CM

## 2016-11-10 DIAGNOSIS — R413 Other amnesia: Secondary | ICD-10-CM

## 2016-11-10 DIAGNOSIS — I639 Cerebral infarction, unspecified: Secondary | ICD-10-CM

## 2016-11-10 LAB — LIPID PANEL
Cholesterol: 108 mg/dL (ref 0–200)
HDL: 34 mg/dL — ABNORMAL LOW (ref >40)
LDL Cholesterol: 49 mg/dL (ref 0–99)
Total CHOL/HDL Ratio: 3.2 RATIO
Triglycerides: 126 mg/dL (ref <150)
VLDL: 25 mg/dL (ref 0–40)

## 2016-11-10 LAB — BASIC METABOLIC PANEL
Anion gap: 6 (ref 5–15)
BUN: 21 mg/dL — ABNORMAL HIGH (ref 6–20)
CO2: 24 mmol/L (ref 22–32)
Calcium: 8.9 mg/dL (ref 8.9–10.3)
Chloride: 107 mmol/L (ref 101–111)
Creatinine, Ser: 1.17 mg/dL (ref 0.61–1.24)
GFR calc Af Amer: >60 mL/min (ref >60)
GFR calc non Af Amer: >60 mL/min (ref >60)
Glucose, Bld: 101 mg/dL — ABNORMAL HIGH (ref 65–99)
Potassium: 3.5 mmol/L (ref 3.5–5.1)
Sodium: 137 mmol/L (ref 135–145)

## 2016-11-10 LAB — CBC
HCT: 37.8 % — ABNORMAL LOW (ref 39.0–52.0)
Hemoglobin: 12.6 g/dL — ABNORMAL LOW (ref 13.0–17.0)
MCH: 30.5 pg (ref 26.0–34.0)
MCHC: 33.3 g/dL (ref 30.0–36.0)
MCV: 91.5 fL (ref 78.0–100.0)
Platelets: 169 10*3/uL (ref 150–400)
RBC: 4.13 MIL/uL — ABNORMAL LOW (ref 4.22–5.81)
RDW: 13 % (ref 11.5–15.5)
WBC: 8.7 10*3/uL (ref 4.0–10.5)

## 2016-11-10 LAB — HIV ANTIBODY (ROUTINE TESTING W REFLEX): HIV Screen 4th Generation wRfx: NONREACTIVE

## 2016-11-10 LAB — HEMOGLOBIN A1C
Hgb A1c MFr Bld: 5 % (ref 4.8–5.6)
Mean Plasma Glucose: 97 mg/dL

## 2016-11-10 MED ORDER — ATORVASTATIN CALCIUM 80 MG PO TABS
80.0000 mg | ORAL_TABLET | Freq: Every day | ORAL | Status: DC
  Filled 2016-11-10: qty 1

## 2016-11-10 MED ORDER — PANTOPRAZOLE SODIUM 40 MG PO TBEC: 40.0000 mg | DELAYED_RELEASE_TABLET | Freq: Every day | ORAL | Status: DC

## 2016-11-10 MED ORDER — ATORVASTATIN CALCIUM 80 MG PO TABS: 80.0000 mg | ORAL_TABLET | Freq: Every day | ORAL | 2 refills | Status: DC

## 2016-11-10 MED ORDER — ASPIRIN EC 81 MG PO TBEC
81.0000 mg | DELAYED_RELEASE_TABLET | Freq: Every day | ORAL | Status: DC
  Administered 2016-11-10: 81 mg via ORAL
  Filled 2016-11-10: qty 1

## 2016-11-10 MED ORDER — CLOPIDOGREL BISULFATE 75 MG PO TABS
75.0000 mg | ORAL_TABLET | Freq: Every day | ORAL | Status: DC
  Administered 2016-11-10: 75 mg via ORAL
  Filled 2016-11-10: qty 1

## 2016-11-10 MED ORDER — METOPROLOL TARTRATE 25 MG PO TABS: 25.0000 mg | ORAL_TABLET | Freq: Two times a day (BID) | ORAL | 2 refills | Status: DC

## 2016-11-10 NOTE — Care Management Note (Addendum)
Case Management Note  Patient Details  Name: [name] MRN: [id number] Date of Birth: 1956/06/12  Subjective/Objective:      Pt admitted with stroke              Action/Plan:   PTA independent from home with wife.  PT/OT ordered.  CM will continue to follow for discharge needs   Expected Discharge Date:                  Expected Discharge Plan:     In-House Referral:     Discharge planning Services  CM Consult  Post Acute Care Choice:    Choice offered to:     DME Arranged:    DME Agency:     HH Arranged:    HH Agency:     Status of Service:  In process, will continue to follow  If discussed at Long Length of Stay Meetings, dates discussed:    Additional Comments: Pt is discharging home today in care of wife.  CM requested out pt neuro OT referral per OT recommendation.  CM sent referral directly to [facility] - Information provide to pt and specified in AVS.   Maryclare Labrador, RN 11/10/2016, 11:50 AM

## 2016-11-10 NOTE — Progress Notes (Signed)
Pt discharged home. Wheeled in wheelchair to private vehicle. PIV's removed. VSS. No complaints at discharge. Discharge instructions reviewed and discharge packet sent with patient. Wife picked pt up.

## 2016-11-10 NOTE — Progress Notes (Signed)
PT Cancellation Note  Patient Details Name: [name] MRN: [id number] DOB: 01-05-1956   Cancelled Treatment:    Reason Eval/Treat Not Completed: PT screened, no needs identified, will sign off. Please re-consult if needed in future.   Jeselle Hiser M Mervyn Pflaum 11/10/2016, 2:16 PM   Kittie Plater, PT, DPT Pager #: [phone] Office #: [phone]

## 2016-11-10 NOTE — Progress Notes (Signed)
OT Cancellation Note  Patient Details Name: [name] MRN: [id number] DOB: 01/25/1956   Cancelled Treatment:    Reason Eval/Treat Not Completed: Other (comment). Pt on bedrest. Please update activity orders when pt appropriate for therapy.   [place], OT/L  [phone] 11/10/2016 11/10/2016, 8:07 AM

## 2016-11-10 NOTE — Discharge Summary (Signed)
Stroke Discharge Summary  Patient ID: [name]   MRN: [id number]      DOB: [birth year]  Date of Admission: 11/09/2016 Date of Discharge: 11/10/2016  Attending Physician:  Rosalin Hawking, MD, Stroke MD Consultant(s):     none Patient's PCP:  [name], MD  DISCHARGE DIAGNOSIS:  Principal Problem:   Stroke-like episode ([place]) - R brain, s/p tPA - TIA vs. anxiety Active Problems:   Coronary artery disease involving native coronary artery   Dyslipidemia, goal LDL below 70   Family hx-stroke  Overweight BMI: Body mass index is 28.06 kg/m.  Past Medical History:  Diagnosis Date  . Acute hepatitis C without mention of hepatic coma(070.51)    Tx by [facility] for Hep.  negative for hep c 2/15.  Marland Kitchen Anxiety   . CAD (coronary artery disease)    a. 10/2015 ant STEMI >> LHC with 3 v CAD; oLAD tx with POBA >> emergent CABG.  . Chronic systolic CHF (congestive heart failure) ([place])   . Colitis   . Dyspnea   . Esophageal reflux    eosinophil esophagitis  . Former tobacco use   . Hypertension   . Ischemic cardiomyopathy    a. EF 25-30% at intraop TEE 4/17  //  b. Limited Echo 5/17 - EF 45-50%, mild ant HK  . Sinus bradycardia    a. HR dropping into 40s in 02/2016 -> BB reduced.  . Symptomatic hypotension    a. 02/2016 ER visit -> meds reduced.   Past Surgical History:  Procedure Laterality Date  . CARDIAC CATHETERIZATION N/A 11/28/2015   Procedure: Left Heart Cath and Coronary Angiography;  Surgeon: [name], MD;  Location: [place] [facility];  Service: Cardiovascular;  Laterality: N/A;  . CARDIAC CATHETERIZATION N/A 11/28/2015   Procedure: Coronary Balloon Angioplasty;  Surgeon: [name], MD;  Location: [place] [facility];  Service: Cardiovascular;  Laterality: N/A;  ostial LAD  . CARDIAC CATHETERIZATION N/A 11/28/2015   Procedure: Coronary/Graft Angiography;  Surgeon: [name], MD;  Location: [place] [facility];  Service: Cardiovascular;  Laterality: N/A;   coronaries only   . CARDIAC CATHETERIZATION N/A 04/21/2016   Procedure: Left Heart Cath and Coronary Angiography;  Surgeon: Wellington Hampshire, MD;  Location: [place];  Service: Cardiovascular;  Laterality: N/A;  . CARDIAC CATHETERIZATION N/A 06/14/2016   Procedure: Left Heart Cath and Cors/Grafts Angiography;  Surgeon: Lorretta Harp, MD;  Location: [place] [facility];  Service: Cardiovascular;  Laterality: N/A;  . CARDIAC CATHETERIZATION N/A 09/08/2016   Procedure: Left Heart Cath and Cors/Grafts Angiography;  Surgeon: Wellington Hampshire, MD;  Location: [place] [facility];  Service: Cardiovascular;  Laterality: N/A;  . CORONARY ARTERY BYPASS GRAFT N/A 11/28/2015   Procedure: CORONARY ARTERY BYPASS GRAFTING (CABG) TIMES FIVE USING LEFT INTERNAL MAMMARY ARTERY AND RIGHT GREATER SAPHENOUS,VIEN HARVEATED BY ENDOVIEN, INTRAOPPRATIVE TEE;  Surgeon: Gaye Pollack, MD;  Location: [facility];  Service: Open Heart Surgery;  Laterality: N/A;  . PERIPHERAL VASCULAR CATHETERIZATION N/A 06/14/2016   Procedure: Lower Extremity Angiography;  Surgeon: Lorretta Harp, MD;  Location: [facility] [facility];  Service: Cardiovascular;  Laterality: N/A;  . SHOULDER SURGERY      Current Meds  Medication Sig  . aluminum hydroxide-magnesium carbonate (GAVISCON) 95-358 MG/15ML SUSP Take 30 mLs by mouth as needed for indigestion or heartburn.  Marland Kitchen aspirin 81 MG tablet Take 81 mg by mouth daily.  . chlordiazePOXIDE (LIBRIUM) 10  MG capsule Take 10 mg by mouth daily as needed for anxiety.   . clopidogrel (PLAVIX) 75 MG tablet Take 75 mg by mouth daily.  Marland Kitchen DM-Doxylamine-Acetaminophen (NYQUIL COLD & FLU PO) Take 1 capsule by mouth at bedtime as needed (cold symptoms).  . ezetimibe (ZETIA) 10 MG tablet Take 1 tablet (10 mg total) by mouth daily.  Marland Kitchen FLUoxetine (PROZAC) 20 MG tablet Take 20 mg by mouth daily.  . fluticasone (FLONASE) 50 MCG/ACT nasal spray Place 2 sprays into both nostrils daily.  . isosorbide mononitrate  (IMDUR) 30 MG 24 hr tablet Take 1 tablet (30 mg total) by mouth daily. (Patient taking differently: Take 30 mg by mouth daily. )  . lisinopril (PRINIVIL,ZESTRIL) 5 MG tablet Take 1 tablet daily. May take additional tablet once daily for SBP greater than 150 one hour after taking daily dose.  . Multiple Vitamins-Minerals (ONE-A-DAY MENS 50+ ADVANTAGE PO) Take 1 tablet by mouth daily.   . nitroGLYCERIN (NITROSTAT) 0.4 MG SL tablet Place 0.4 mg under the tongue every 5 (five) minutes as needed for chest pain. 3 DOSE MAX  . ranitidine (ZANTAC) 150 MG tablet Take 150 mg by mouth daily as needed for heartburn.   . zolpidem (AMBIEN) 10 MG tablet Take 10 mg by mouth at bedtime as needed for sleep. Reported on 02/09/2016  . [DISCONTINUED] atorvastatin (LIPITOR) 40 MG tablet Take 1 tablet (40 mg total) by mouth daily. (Patient taking differently: Take 80 mg by mouth daily. )    LABORATORY STUDIES CBC    Component Value Date/Time   WBC 8.7 11/10/2016 0207   RBC 4.13 (L) 11/10/2016 0207   HGB 12.6 (L) 11/10/2016 0207   HCT 37.8 (L) 11/10/2016 0207   PLT 169 11/10/2016 0207   MCV 91.5 11/10/2016 0207   MCH 30.5 11/10/2016 0207   MCHC 33.3 11/10/2016 0207   RDW 13.0 11/10/2016 0207   LYMPHSABS 1.8 11/09/2016 1010   MONOABS 0.5 11/09/2016 1010   EOSABS 0.1 11/09/2016 1010   BASOSABS 0.0 11/09/2016 1010   CMP    Component Value Date/Time   NA 137 11/10/2016 0207   K 3.5 11/10/2016 0207   CL 107 11/10/2016 0207   CO2 24 11/10/2016 0207   GLUCOSE 101 (H) 11/10/2016 0207   BUN 21 (H) 11/10/2016 0207   CREATININE 1.17 11/10/2016 0207   CREATININE 1.00 02/12/2016 1038   CALCIUM 8.9 11/10/2016 0207   PROT 7.1 11/09/2016 1010   PROT 7.2 09/30/2016 0939   ALBUMIN 4.4 11/09/2016 1010   ALBUMIN 4.7 09/30/2016 0939   AST 20 11/09/2016 1010   ALT 15 (L) 11/09/2016 1010   ALKPHOS 69 11/09/2016 1010   BILITOT 0.8 11/09/2016 1010   BILITOT 0.5 09/30/2016 0939   GFRNONAA >60 11/10/2016 0207   GFRAA  >60 11/10/2016 0207   COAGS Lab Results  Component Value Date   INR 0.99 11/09/2016   INR 1.06 11/02/2016   INR 0.99 09/08/2016   Lipid Panel    Component Value Date/Time   CHOL 108 11/10/2016 0207   CHOL 130 09/30/2016 0939   TRIG 126 11/10/2016 0207   HDL 34 (L) 11/10/2016 0207   HDL 55 09/30/2016 0939   CHOLHDL 3.2 11/10/2016 0207   VLDL 25 11/10/2016 0207   LDLCALC 49 11/10/2016 0207   LDLCALC 54 09/30/2016 0939   HgbA1C  Lab Results  Component Value Date   HGBA1C 5.1 09/07/2016   Urinalysis    Component Value Date/Time   COLORURINE YELLOW 11/09/2016 1010  APPEARANCEUR CLEAR 11/09/2016 1010   LABSPEC 1.017 11/09/2016 1010   PHURINE 5.0 11/09/2016 1010   GLUCOSEU NEGATIVE 11/09/2016 1010   HGBUR NEGATIVE 11/09/2016 1010   BILIRUBINUR NEGATIVE 11/09/2016 1010   KETONESUR NEGATIVE 11/09/2016 1010   PROTEINUR NEGATIVE 11/09/2016 1010   UROBILINOGEN 0.2 05/13/2014 0658   NITRITE NEGATIVE 11/09/2016 1010   LEUKOCYTESUR NEGATIVE 11/09/2016 1010   Urine Drug Screen     Component Value Date/Time   LABOPIA NONE DETECTED 11/09/2016 1010   COCAINSCRNUR NONE DETECTED 11/09/2016 1010   LABBENZ POSITIVE (A) 11/09/2016 1010   AMPHETMU NONE DETECTED 11/09/2016 1010   THCU NONE DETECTED 11/09/2016 1010   LABBARB NONE DETECTED 11/09/2016 1010    Alcohol Level    Component Value Date/Time   ETH <5 11/09/2016 1030     SIGNIFICANT DIAGNOSTIC STUDIES I have personally reviewed the radiological images below and agree with the radiology interpretations.  Ct Head Code Stroke W/o Cm 11/09/2016 1. No evidence of acute intracranial abnormality. 2. ASPECTS is 10.   Ct Angio Head W Or Wo Contrast Ct Angio Neck W Or Wo Contrast 11/09/2016 1. Negative for emergent large vessel occlusion, and no hemodynamically significant arterial stenosis in the head or neck. There is intermittent soft and calcified plaque, including a small area of soft plaque along the anteromedial right  CCA. 2. Stable and negative CT appearance of the brain. 3. Prior CABG. No acute findings identified in the neck or upper chest.   Mr Brain Wo Contrast 11/10/2016 No recent ischemia identified. Normal for age noncontrast MRI appearance of the brain.   2-D echocardiogram - Left ventricle: The cavity size was normal. Wall thickness was normal. Systolic function was normal. The estimated ejection fraction was in the range of 55% to 60%. Wall motion was normal; there were no regional wall motion abnormalities. Doppler parameters are consistent with abnormal left ventricular relaxation (grade 1 diastolic dysfunction). - Mitral valve: Calcified annulus. Impressions:   Normal LV systolic function; mild diastolic dysfunction; trace MR and TR.   HISTORY OF PRESENT ILLNESS [name] is a 61 y.o. male with hx of MI on asa and plavix who comes into the hospital for complaints of difficulty performing tasks and left arm and leg weakness.  At [facility] today 11/09/2016 (LKW) started to have difficulty writing (he is left handed) and thinking.  He then felt some numbness and weakness of the left side.  Leg > arm.  Says he has had similar confusion spells in th past but never involving one sided motor or sensory complaints. Patient was administered IV TPA and admitted to the medical ICU for further evaluation and treatment.    HOSPITAL COURSE Mr. Damon Hargrove is a 61 y.o. male with history of MI presenting with L arm and leg weakness. He received IV t-PA 11/09/2016 at 1039.   R brain Stroke-like episode s/p IV tPA - TIA vs. Anxiety. Pt does have risk factor for stroke including CAD s/p CABG, HLD on lipitor, hx of CHF, family hx of stroke, however, neuro imaging did not support severe atherosclerosis, and pt does have anxiety and stress with memory and concentration difficulty since MI  CT head no acute abnormality. Aspects 10  CTA head and neck no LVO. R CCA soft plaque. No acute findings  MRI  No acute  stroke  2D Echo   EF 55-60%. No source of embolus. Mild diastolic dysfunction w/ trace MR and TR  LDL 49  HgbA1c pending this admission, but 5.1 in  Jan  SCDs for VTE prophylaxis Diet regular Room service appropriate? Yes; Fluid consistency: Thin  aspirin 81 mg daily and clopidogrel 75 mg daily prior to admission, 24h post tPA restarted on aspirin 81 mg daily and clopidogrel 75 mg daily  Patient counseled to be compliant with his antithrombotic medications  Ongoing aggressive stroke risk factor management  Needs outpt 30 day monitor with Dr. Irish Lack to look for atrial fibrillation.   Therapy recommendations:  Out pt OT  Disposition:  Return home  Cardiomyopathy - improved  Hx of CHF with EF 25% and improved to 45%  This admission EF 55-60%  Continue metoprolol and lisinopril  Follow up with Dr. Irish Lack as outpt  Hyperlipidemia  Home meds:  lipitor 80 and zetia, resumed in hospital  LDL 49, goal < 70  Continue statin at discharge  Memory difficulty  Has been going on since MI  Likely the cause of anxiety and stress  OT recommend neuropsych testing  Will refer to Dr. Si Raider in [facility] for neuropsych testing  Other Stroke Risk Factors  Hx Cigarette smoker  Family hx stroke (father, maternal grandmother)  Coronary artery disease - hx MI s/p CABG  Chronic systolic CHF  Other Active Problems  Elevated Cr 1.17  Bipolar on librium and prozac   DISCHARGE EXAM Blood pressure 124/72, pulse 75, temperature 98.2 F (36.8 C), temperature source Oral, resp. rate 19, weight 190 lb 0.6 oz (86.2 kg), SpO2 100 %. Constitutional: Appears well-developed and well-nourished.  Psych: Affect appropriate to situation Eyes: No scleral injection HENT: No OP obstrucion Head: Normocephalic.  Cardiovascular: Normal rate and regular rhythm.  Respiratory: Effort normal and breath sounds normal to anterior ascultation GI: Soft.  No distension. There is no tenderness.  Skin:  WDI  Neuro: Mental Status: Patient is awake, alert, oriented to person, place, month, year, and situation Patient is able to give a clear and coherent history No signs of aphasia or neglect Cranial Nerves: II: Visual Fields are full. Pupils are equal, round, and reactive to light. III,IV, VI: EOMI without ptosis or diploplia.  V: Facial sensation is symmetric to temperature VII: Facial movement is mildly asymmetric.  VIII: hearing is intact to voice X: Uvula elevates symmetrically XI: Shoulder shrug is symmetric. XII: tongue is deviates right Motor: Tone is normal. Bulk is normal. 5/5 strength was present in all four extremities Sensory: Sensation is symmetrical Deep Tendon Reflexes: 2+ and symmetric in the biceps and patellae. Plantars: Toes are equivocal Cerebellar: FNF and HKS are intact bilaterally  Discharge Diet   Diet regular Room service appropriate? Yes; Fluid consistency: Thin liquids  DISCHARGE PLAN  Disposition:  Return home  Continue aspirin 81 mg daily and clopidogrel 75 mg daily for secondary stroke prevention.  OP 30 d monitor to look for atrial fibrillation as source of symptoms requested from Dr. Irish Lack  Ongoing risk factor control by Primary Care Physician at time of discharge  Follow-up MEYERS, Annie Main, MD in 2 weeks.  Follow up with Dr. Si Raider at [facility] of neuropsych testing  Follow-up with Cecille Rubin NP, Stroke Clinic in 6 weeks, office to schedule an appointment.  40 minutes were spent preparing discharge.  Rosalin Hawking, MD PhD Stroke Neurology 11/10/2016 1:35 PM

## 2016-11-10 NOTE — Discharge Instructions (Signed)
You were admitted for stroke like symptoms. You received tPA and symptoms resolved. Had MRI brain which showed no stroke. And brain vessel imaging unremarkable. Your heart function is normal now. We resumed your medication, continue ASA and lipitor and plavix for stroke prevention. You need to follow up with Dr. Irish Lack to arrange for a cardiac monitoring for 30 days to rule out irregular heart beat. You need to follow up with Dr. Si Raider for neuropsychological testing, you will be called for the appointment. Any stroke like symptoms please call [phone] for further evaluation. Thanks.

## 2016-11-10 NOTE — Plan of Care (Signed)
Problem: Education: Goal: Knowledge of patient specific risk factors addressed and post discharge goals established will improve Outcome: Progressing Patient educated on goals of tPA therapy. Patient educated on risk factors of smoking.

## 2016-11-10 NOTE — Progress Notes (Signed)
Occupational Therapy Evaluation Patient Details Name: [name] MRN: [id number] DOB: [birth year] Today's Date: 11/10/2016    History of Present Illness 61 y.o. male with history of MI presenting with L arm and leg weakness. He received IV t-PA 11/09/2016 at 1039. PMH CAD; MI/CABG 10/2016.CHF.    Clinical Impression   Pt is at baseline physically but states that he gets "dizzy" when he walks long distances (ongoing problem). Pt reports that he is having increased difficulty at work with tasks he never had problems with before. States these problems have been very noticeable over the last 3 months and then were accompanied by L sided weakness on the day of admission. Pt staets "I just lost it because I couldn't due to things that I've done for years". Pt assessed with the [facility] Cognitive Assessment [facility]), which demonstrated impairment with higher level executive level skills, attention and memory. These deficits are interferring with the patient's ability to work and complete IADL tasks, I.e. Doctor, hospital. During the assessment, pt discussed several current life stressors and recent difficulty sleeping. Pt very appreciative.  Recommend follow up with a Neuropsychologist for further cognitive assessment and counseling. Also recommend neuro outpt OT to assist with strategies to reduce frustration with apparent cognitive deficits regarding vocation and IADL tasks.      Follow Up Recommendations  Outpatient OT;Other (comment) (neuro outpt OT; neuropsychologist consult)    Equipment Recommendations  None recommended by OT    Recommendations for Other Services       Precautions / Restrictions Precautions Precautions: None      Mobility Bed Mobility Overal bed mobility: Modified Independent                Transfers Overall transfer level: Independent                    Balance Overall balance assessment: No apparent balance deficits (not formally assessed)                                           ADL Overall ADL's : At baseline                                       General ADL Comments: difficulty at work with staying focused on task,, problem solving and working with blueprints, organization al skills, forgetting to pay bills     Vision Baseline Vision/History: Wears glasses Wears Glasses: Distance only Patient Visual Report: No change from baseline Vision Assessment?: No apparent visual deficits     Perception Perception Comments: Difficulty with "cube copy"   Praxis Praxis Praxis tested?: Within functional limits    Pertinent Vitals/Pain Pain Assessment: No/denies pain     Hand Dominance Left   Extremity/Trunk Assessment Upper Extremity Assessment Upper Extremity Assessment: Overall WFL for tasks assessed   Lower Extremity Assessment Lower Extremity Assessment: Overall WFL for tasks assessed   Cervical / Trunk Assessment Cervical / Trunk Assessment: Normal   Communication Communication Communication: No difficulties   Cognition Arousal/Alertness: Awake/alert Behavior During Therapy: Anxious Overall Cognitive Status: Impaired/Different from baseline Area of Impairment: Attention;Memory;Problem solving   Current Attention Level: Selective Memory: Decreased short-term memory       Problem Solving: Slow processing  MOCA - score 25 Pt had increased difficulty with problem solving and took  increased time to complete areas of assessment: repeating numbers/ copying cube Easily distracted during assessment by external stimuli (door opening/ cell phone lighting up) States he feels "disorganized"     General Comments       Exercises       Shoulder Instructions      Home Living Family/patient expects to be discharged to:: Private residence Living Arrangements: Spouse/significant other Available Help at Discharge: Family;Available PRN/intermittently Type of Home: House                                   Prior Functioning/Environment Level of Independence: Independent        Comments: owns a [employer] Problem List: Decreased cognition      OT Treatment/Interventions:      OT Goals(Current goals can be found in the care plan section) Acute Rehab OT Goals Patient Stated Goal: to be able to think straight OT Goal Formulation: All assessment and education complete, DC therapy  OT Frequency:     Barriers to D/C:            Co-evaluation              End of Session Nurse Communication: Other (comment) (DC needs)  Activity Tolerance: Patient tolerated treatment well Patient left: in bed;with call bell/phone within reach;with bed alarm set  OT Visit Diagnosis: Cognitive communication deficit (R41.841);Muscle weakness (generalized) (M62.81) Symptoms and signs involving cognitive functions: Other cerebrovascular disease                ADL either performed or assessed with clinical judgement  Time: 1230-1300 OT Time Calculation (min): 30 min Charges:  OT General Charges $OT Visit: 1 Procedure OT Evaluation $OT Eval Moderate Complexity: 1 Procedure OT Treatments $Therapeutic Activity: 8-22 mins G-Codes:     [facility], OT/L  [phone] 11/10/2016  Romesha Scherer,HILLARY 11/10/2016, 2:01 PM

## 2016-11-12 ENCOUNTER — Other Ambulatory Visit: Payer: Self-pay | Admitting: Physician Assistant

## 2016-11-12 ENCOUNTER — Encounter: Payer: Self-pay | Admitting: Psychology

## 2016-11-12 DIAGNOSIS — I639 Cerebral infarction, unspecified: Secondary | ICD-10-CM

## 2016-11-13 NOTE — Progress Notes (Signed)
Scored for Modified Rankin based on review of medical record on 11/13/16.       11/10/16 1407  Modified Rankin (Stroke Patients Only)  Pre-Morbid Rankin Score 1  Modified Rankin [place], [place]  [phone] 11/13/2016

## 2016-11-23 ENCOUNTER — Other Ambulatory Visit: Payer: Self-pay | Admitting: Physician Assistant

## 2016-11-23 ENCOUNTER — Ambulatory Visit (INDEPENDENT_AMBULATORY_CARE_PROVIDER_SITE_OTHER): Payer: BC Managed Care – PPO

## 2016-11-23 DIAGNOSIS — I639 Cerebral infarction, unspecified: Secondary | ICD-10-CM

## 2016-11-23 DIAGNOSIS — I4891 Unspecified atrial fibrillation: Secondary | ICD-10-CM

## 2016-12-08 ENCOUNTER — Encounter: Payer: Self-pay | Admitting: Interventional Cardiology

## 2016-12-16 ENCOUNTER — Ambulatory Visit (INDEPENDENT_AMBULATORY_CARE_PROVIDER_SITE_OTHER): Payer: BC Managed Care – PPO | Admitting: Psychology

## 2016-12-16 ENCOUNTER — Encounter: Payer: Self-pay | Admitting: Psychology

## 2016-12-16 DIAGNOSIS — I639 Cerebral infarction, unspecified: Secondary | ICD-10-CM

## 2016-12-16 DIAGNOSIS — R2990 Unspecified symptoms and signs involving the nervous system: Secondary | ICD-10-CM

## 2016-12-16 DIAGNOSIS — R413 Other amnesia: Secondary | ICD-10-CM

## 2016-12-16 DIAGNOSIS — F419 Anxiety disorder, unspecified: Secondary | ICD-10-CM

## 2016-12-16 NOTE — Progress Notes (Signed)
   Neuropsychology Note  [name] came in today for 2 hours of neuropsychological testing with technician, Milana Kidney, BS, under the supervision of Dr. Macarthur Critchley. The patient did not appear overtly distressed by the testing session, per behavioral observation or via self-report to the technician. Rest breaks were offered. [name] will return within 2 weeks for a feedback session with Dr. Si Raider at which time his test performances, clinical impressions and treatment recommendations will be reviewed in detail. The patient understands he can contact our office should he require our assistance before this time.  Full report to follow.
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
## 2016-12-21 ENCOUNTER — Telehealth: Payer: Self-pay | Admitting: Interventional Cardiology

## 2016-12-21 NOTE — Telephone Encounter (Signed)
New Message     Does he need to wear the monitor longer, the battery would not charge, he missed like 3 days

## 2016-12-22 ENCOUNTER — Encounter: Payer: Self-pay | Admitting: Nurse Practitioner

## 2016-12-22 ENCOUNTER — Ambulatory Visit (INDEPENDENT_AMBULATORY_CARE_PROVIDER_SITE_OTHER): Payer: BC Managed Care – PPO | Admitting: Nurse Practitioner

## 2016-12-22 VITALS — BP 125/77 | HR 52 | Ht 68.75 in | Wt 193.2 lb

## 2016-12-22 DIAGNOSIS — F3160 Bipolar disorder, current episode mixed, unspecified: Secondary | ICD-10-CM | POA: Diagnosis not present

## 2016-12-22 DIAGNOSIS — R2990 Unspecified symptoms and signs involving the nervous system: Secondary | ICD-10-CM

## 2016-12-22 DIAGNOSIS — I639 Cerebral infarction, unspecified: Secondary | ICD-10-CM

## 2016-12-22 DIAGNOSIS — I10 Essential (primary) hypertension: Secondary | ICD-10-CM

## 2016-12-22 DIAGNOSIS — E785 Hyperlipidemia, unspecified: Secondary | ICD-10-CM | POA: Diagnosis not present

## 2016-12-22 NOTE — Progress Notes (Addendum)
[facility]  PATIENT: [name] DOB: Nov 30, 1955   REASON FOR VISIT: hospital follow up for stroke HISTORY FROM:patient  HISTORY OF PRESENT ILLNESS:MrKubrickis a 61 y.o.malewith hx of MI on asa and plavix who comes into the hospital for complaints of difficulty performing tasks and left arm and leg weakness. At [facility] today 11/09/2016 (LKW) started to have difficulty writing (he is left handed) and thinking. He then felt some numbness and weakness of the left side. Leg >arm. Says he has had similar confusion spells in th past but never involving one sided motor or sensory complaints. Patient was administered IV TPA and admitted to the medical ICU for further evaluation and treatment. Right brain stroke like episodes status post IV TPA/TIA versus anxiety patient does have risk factors for stroke including coronary artery disease status post CABG hyperlipidemia history of congestive heart failure and family history of stroke. CT of the head no acute abnormality. CTA of the head and neck no large vessel occlusion. No acute findings. MRI no acute stroke. 2-D echo EF 55-60% and no source of embolus. LDL 49 hemoglobin A1c 5. He returns to the stroke clinic today for follow-up. He remains on Plavix and aspirin for secondary stroke prevention. He has not had further stroke or TIA symptoms. He currently has 30 day monitor to look for atrial fibrillation. He did not receive any outpatient therapies he remains on Lipitor and Zetia without complaints of myalgias. He is just repeated neuropsych testing but has not received the results. He has a history of bipolar disorder on Librium and Prozac. He complains of cognitive impairment. Blood pressure today 125/77. He quit smoking after his MI . He denies ever drinking alcohol. He returns for reevaluation   REVIEW OF SYSTEMS: Full 14 system review of systems performed and notable only for those listed, all others are neg:  Constitutional:  Fatigue Cardiovascular: History of MI Ear/Nose/Throat: neg  Skin: neg Eyes: neg Respiratory: neg Gastroitestinal: neg  Hematology/Lymphatic: neg  Endocrine: neg Musculoskeletal:neg Allergy/Immunology: neg Neurological: neg Psychiatric: Anxiety and bipolar disorder Sleep : neg   ALLERGIES: Allergies  Allergen Reactions  . Prednisone Other (See Comments)    States that this med makes him "crazy"  . Tetanus Toxoids Swelling and Other (See Comments)    Fever, Swelling of the arm   . Wellbutrin [Bupropion] Other (See Comments)    Crazy thoughts, nightmares  . Chantix [Varenicline] Other (See Comments)    Dreams    HOME MEDICATIONS: Outpatient Medications Prior to Visit  Medication Sig Dispense Refill  . aluminum hydroxide-magnesium carbonate (GAVISCON) 95-358 MG/15ML SUSP Take 30 mLs by mouth as needed for indigestion or heartburn.    Marland Kitchen aspirin 81 MG tablet Take 81 mg by mouth daily.    Marland Kitchen atorvastatin (LIPITOR) 80 MG tablet Take 1 tablet (80 mg total) by mouth daily. 30 tablet 2  . chlordiazePOXIDE (LIBRIUM) 10 MG capsule Take 10 mg by mouth daily as needed for anxiety.     . clopidogrel (PLAVIX) 75 MG tablet Take 75 mg by mouth daily.    Marland Kitchen DM-Doxylamine-Acetaminophen (NYQUIL COLD & FLU PO) Take 1 capsule by mouth at bedtime as needed (cold symptoms).    . fluticasone (FLONASE) 50 MCG/ACT nasal spray Place 2 sprays into both nostrils daily.    Marland Kitchen lisinopril (PRINIVIL,ZESTRIL) 5 MG tablet Take 1 tablet daily. May take additional tablet once daily for SBP greater than 150 one hour after taking daily dose. 135 tablet 3  . metoprolol tartrate (LOPRESSOR)  25 MG tablet Take 1 tablet (25 mg total) by mouth 2 (two) times daily. 60 tablet 2  . Multiple Vitamins-Minerals (ONE-A-DAY MENS 50+ ADVANTAGE PO) Take 1 tablet by mouth daily.     . nitroGLYCERIN (NITROSTAT) 0.4 MG SL tablet Place 0.4 mg under the tongue every 5 (five) minutes as needed for chest pain. 3 DOSE MAX    . ranitidine  (ZANTAC) 150 MG tablet Take 150 mg by mouth daily as needed for heartburn.     . ezetimibe (ZETIA) 10 MG tablet Take 1 tablet (10 mg total) by mouth daily. 30 tablet 11  . FLUoxetine (PROZAC) 20 MG tablet Take 20 mg by mouth daily.  0  . zolpidem (AMBIEN) 10 MG tablet Take 10 mg by mouth at bedtime as needed for sleep. Reported on 02/09/2016     No facility-administered medications prior to visit.     PAST MEDICAL HISTORY: Past Medical History:  Diagnosis Date  . Acute hepatitis C without mention of hepatic coma(070.51)    Tx by [facility] for Hep.  negative for hep c 2/15.  Marland Kitchen Anxiety   . CAD (coronary artery disease)    a. 10/2015 ant STEMI >> LHC with 3 v CAD; oLAD tx with POBA >> emergent CABG.  . Chronic systolic CHF (congestive heart failure) ([place])   . Colitis   . Dyspnea   . Esophageal reflux    eosinophil esophagitis  . Former tobacco use   . Hypertension   . Ischemic cardiomyopathy    a. EF 25-30% at intraop TEE 4/17  //  b. Limited Echo 5/17 - EF 45-50%, mild ant HK  . Sinus bradycardia    a. HR dropping into 40s in 02/2016 -> BB reduced.  . Symptomatic hypotension    a. 02/2016 ER visit -> meds reduced.    PAST SURGICAL HISTORY: Past Surgical History:  Procedure Laterality Date  . CARDIAC CATHETERIZATION N/A 11/28/2015   Procedure: Left Heart Cath and Coronary Angiography;  Surgeon: [name], MD;  Location: [facility];  Service: Cardiovascular;  Laterality: N/A;  . CARDIAC CATHETERIZATION N/A 11/28/2015   Procedure: Coronary Balloon Angioplasty;  Surgeon: [name], MD;  Location: [facility];  Service: Cardiovascular;  Laterality: N/A;  ostial LAD  . CARDIAC CATHETERIZATION N/A 11/28/2015   Procedure: Coronary/Graft Angiography;  Surgeon: [name], MD;  Location: [facility];  Service: Cardiovascular;  Laterality: N/A;  coronaries only   . CARDIAC CATHETERIZATION N/A 04/21/2016   Procedure: Left Heart Cath and Coronary  Angiography;  Surgeon: Wellington Hampshire, MD;  Location: [facility];  Service: Cardiovascular;  Laterality: N/A;  . CARDIAC CATHETERIZATION N/A 06/14/2016   Procedure: Left Heart Cath and Cors/Grafts Angiography;  Surgeon: Lorretta Harp, MD;  Location: [facility];  Service: Cardiovascular;  Laterality: N/A;  . CARDIAC CATHETERIZATION N/A 09/08/2016   Procedure: Left Heart Cath and Cors/Grafts Angiography;  Surgeon: Wellington Hampshire, MD;  Location: [facility];  Service: Cardiovascular;  Laterality: N/A;  . CORONARY ARTERY BYPASS GRAFT N/A 11/28/2015   Procedure: CORONARY ARTERY BYPASS GRAFTING (CABG) TIMES FIVE USING LEFT INTERNAL MAMMARY ARTERY AND RIGHT GREATER SAPHENOUS,VIEN HARVEATED BY ENDOVIEN, INTRAOPPRATIVE TEE;  Surgeon: Gaye Pollack, MD;  Location: [facility];  Service: Open Heart Surgery;  Laterality: N/A;  . PERIPHERAL VASCULAR CATHETERIZATION N/A 06/14/2016   Procedure: Lower Extremity Angiography;  Surgeon: Lorretta Harp, MD;  Location: [facility];  Service: Cardiovascular;  Laterality: N/A;  . SHOULDER SURGERY      FAMILY HISTORY: Family History  Problem Relation Age of Onset  . Lung cancer Mother   . Heart Problems Father   . Heart attack Father 75  . Stroke Father   . Heart attack Maternal Grandmother   . Stroke Maternal Grandmother   . Heart attack Paternal Uncle   . Hypertension Brother   . Autoimmune disease Neg Hx     SOCIAL HISTORY: Social History   Social History  . Marital status: Married    Spouse name: [name]  . Number of children: 3  . Years of education: College   Occupational History  . Self-employed Self-Employed   Social History Main Topics  . Smoking status: Former Smoker    Packs/day: 0.50    Types: Cigarettes    Quit date: 11/28/2015  . Smokeless tobacco: Never Used     Comment: Quit smoking 10/2015 after MI  . Alcohol use No  . Drug use: No  . Sexual activity: Not on file   Other Topics Concern  . Not on  file   Social History Narrative   Patient lives at home with his spouse.   Caffeine Use: yes     PHYSICAL EXAM  Vitals:   12/22/16 1431  BP: 125/77  Pulse: (!) 52  Weight: 193 lb 3.2 oz (87.6 kg)  Height: 5' 8.75" (1.746 m)   Body mass index is 28.74 kg/m.  Generalized: Well developed, in no acute distress  Head: normocephalic and atraumatic,. Oropharynx benign  Neck: Supple, no carotid bruits  Cardiac: Regular rate rhythm, no murmur  Musculoskeletal: No deformity   Neurological examination   Mentation: Alert oriented to time, place, history taking. Attention span and concentration appropriate. Recent and remote memory intact.  Follows all commands speech and language fluent.   Cranial nerve II-XII: Pupils were equal round reactive to light extraocular movements were full, visual field were full on confrontational test. Facial sensation and strength were normal. hearing was intact to finger rubbing bilaterally. Uvula tongue midline. head turning and shoulder shrug were normal and symmetric.Tongue protrusion into cheek strength was normal. Motor: normal bulk and tone, full strength in the BUE, BLE, fine finger movements normal, no pronator drift. No focal weakness Sensory: normal and symmetric to light touch, pinprick, and  Vibration, in the upper and lower extremities Coordination: finger-nose-finger, heel-to-shin bilaterally, no dysmetria Reflexes: Brachioradialis 2/2, biceps 2/2, triceps 2/2, patellar 2/2, Achilles 2/2, plantar responses were flexor bilaterally. Gait and Station: Rising up from seated position without assistance, normal stance,  moderate stride, good arm swing, smooth turning, able to perform tiptoe, and heel walking without difficulty. Tandem gait is unsteady  DIAGNOSTIC DATA (LABS, IMAGING, TESTING) - I reviewed patient records, labs, notes, testing and imaging myself where available.  Lab Results  Component Value Date   WBC 8.7 11/10/2016   HGB 12.6  (L) 11/10/2016   HCT 37.8 (L) 11/10/2016   MCV 91.5 11/10/2016   PLT 169 11/10/2016      Component Value Date/Time   NA 137 11/10/2016 0207   K 3.5 11/10/2016 0207   CL 107 11/10/2016 0207   CO2 24 11/10/2016 0207   GLUCOSE 101 (H) 11/10/2016 0207   BUN 21 (H) 11/10/2016 0207   CREATININE 1.17 11/10/2016 0207   CREATININE 1.00 02/12/2016 1038   CALCIUM 8.9 11/10/2016 0207   PROT 7.1 11/09/2016 1010   PROT 7.2 09/30/2016 0939   ALBUMIN 4.4 11/09/2016 1010  ALBUMIN 4.7 09/30/2016 0939   AST 20 11/09/2016 1010   ALT 15 (L) 11/09/2016 1010   ALKPHOS 69 11/09/2016 1010   BILITOT 0.8 11/09/2016 1010   BILITOT 0.5 09/30/2016 0939   GFRNONAA >60 11/10/2016 0207   GFRAA >60 11/10/2016 0207   Lab Results  Component Value Date   CHOL 108 11/10/2016   HDL 34 (L) 11/10/2016   LDLCALC 49 11/10/2016   TRIG 126 11/10/2016   CHOLHDL 3.2 11/10/2016   Lab Results  Component Value Date   HGBA1C 5.0 11/10/2016     ASSESSMENT AND PLAN  61 y.o. year old male  has a past medical history of Anxiety; CAD (coronary artery disease); Chronic systolic CHF (congestive heart failure) ([place]);  Hypertension; Ischemic cardiomyopathy; Sinus bradycardia; and Symptomatic hypotension. here with Right brain stroke like episodes status post IV TPA/TIA versus anxiety patient does have risk factors for stroke The patient is a current patient of Dr. Erlinda Hong who is out of the office today . This note is sent to the work in doctor.     PLAN: Stressed the importance of management of risk factors to prevent further stroke Continue Plavix and aspirin for secondary stroke prevention Maintain strict control of hypertension with blood pressure goal below 130/90, today's reading 125/77 continue antihypertensive medications Control of diabetes with hemoglobin A1c below 6.5 followed by primary care most recent hemoglobin A1c5.0 Cholesterol with LDL cholesterol less than 70, followed by primary care,   continue Lipitor and  Zetia Exercise by walking, slowly increase , eat healthy diet with whole grains,  fresh fruits and vegetables F/U in 4 months Discussed risk for recurrent stroke/ TIA and answered additional questions This was a prolonged visit requiring 30 minutes and medical decision making of high complexity with extensive review of history, hospital chart, counseling and answering questions Dennie Bible, [facility], [facility], APRN  [facility] [address] [place], [place] [address] [phone]  I reviewed the above note and documentation by the Nurse Practitioner and agree with the history, physical exam, assessment and plan as outlined above. I was immediately available for face-to-face consultation. Star Age, MD, PhD [facility])

## 2016-12-22 NOTE — Telephone Encounter (Signed)
Patient instructed to wear cardiac event monitor until 12/24/16.

## 2016-12-22 NOTE — Patient Instructions (Signed)
Stressed the importance of management of risk factors to prevent further stroke Continue Plavix and aspirin for secondary stroke prevention Maintain strict control of hypertension with blood pressure goal below 130/90, today's reading 125/77 continue antihypertensive medications Control of diabetes with hemoglobin A1c below 6.5 followed by primary care most recent hemoglobin A1c5.0 Cholesterol with LDL cholesterol less than 70, followed by primary care,   continue Lipitor and Zetia Exercise by walking, slowly increase , eat healthy diet with whole grains,  fresh fruits and vegetables F/U in 4 months

## 2016-12-28 ENCOUNTER — Encounter: Payer: Self-pay | Admitting: Interventional Cardiology

## 2016-12-28 ENCOUNTER — Ambulatory Visit (INDEPENDENT_AMBULATORY_CARE_PROVIDER_SITE_OTHER): Payer: BC Managed Care – PPO | Admitting: Interventional Cardiology

## 2016-12-28 VITALS — BP 132/84 | HR 79 | Ht 68.75 in | Wt 192.0 lb

## 2016-12-28 DIAGNOSIS — Z951 Presence of aortocoronary bypass graft: Secondary | ICD-10-CM

## 2016-12-28 DIAGNOSIS — E7800 Pure hypercholesterolemia, unspecified: Secondary | ICD-10-CM | POA: Diagnosis not present

## 2016-12-28 DIAGNOSIS — I25118 Atherosclerotic heart disease of native coronary artery with other forms of angina pectoris: Secondary | ICD-10-CM | POA: Diagnosis not present

## 2016-12-28 NOTE — Progress Notes (Signed)
Cardiology Office Note   Date:  12/28/2016   ID:  [name], DOB August 21, 1956, MRN [id number]  PCP:  [name], MD    No chief complaint on file. CAD   Wt Readings from Last 3 Encounters:  12/28/16 192 lb (87.1 kg)  12/22/16 193 lb 3.2 oz (87.6 kg)  11/10/16 185 lb (83.9 kg)       History of Present Illness: [name] is a 61 y.o. male with history of CAD (STEMI 10/2015 s/p POBA of LAD with IABP and subsequent emergent CABG), HCV s/p treatment, prior tobacco abuse, GERD, anxiety, chronic systolic CHF (most recent EF 45-50%), tobacco abuse who presents for f/u. He was previously seen in the pharmacist-driven HTN clinic for BP med adjustment at which time it was noted his home cuff was overestimating systolic blood pressure by 10-15 points. Last echo 12/2015 to re-eval EF showed improvement to 45-50%.   He has imporoved his diet signficantly since the MI.    His appetite is increasing.    He felt better when taking Cymbalta.  He stopped it.   Repeat Cath post CABG showed patent grafts with some disease noted.  LE angio was negative.  He continues to walk for exercise.  He is concerned that his BP goes up to 546 systolic with exercise.  He is interested in doing some weight training.  He is bothered by allergies.    He had a stroke-like sx in 3/18.  He received tPA and had a full recovery.  MRI was negative.  He had facial droop.  He is getting cognitive testing done and will get results in a few days.  He continues to walk.     He has noted low BP and HR when at rest.    He has gained weight after starting Trazodone.    Past Medical History:  Diagnosis Date  . Acute hepatitis C without mention of hepatic coma(070.51)    Tx by [facility] for Hep.  negative for hep c 2/15.  Marland Kitchen Anxiety   . CAD (coronary artery disease)    a. 10/2015 ant STEMI >> LHC with 3 v CAD; oLAD tx with POBA >> emergent CABG.  . Chronic systolic CHF (congestive heart failure) ([place])   . Colitis    . Dyspnea   . Esophageal reflux    eosinophil esophagitis  . Former tobacco use   . Hypertension   . Ischemic cardiomyopathy    a. EF 25-30% at intraop TEE 4/17  //  b. Limited Echo 5/17 - EF 45-50%, mild ant HK  . Sinus bradycardia    a. HR dropping into 40s in 02/2016 -> BB reduced.  . Symptomatic hypotension    a. 02/2016 ER visit -> meds reduced.    Past Surgical History:  Procedure Laterality Date  . CARDIAC CATHETERIZATION N/A 11/28/2015   Procedure: Left Heart Cath and Coronary Angiography;  Surgeon: [name], MD;  Location: [place] [facility];  Service: Cardiovascular;  Laterality: N/A;  . CARDIAC CATHETERIZATION N/A 11/28/2015   Procedure: Coronary Balloon Angioplasty;  Surgeon: [name], MD;  Location: [place] [facility];  Service: Cardiovascular;  Laterality: N/A;  ostial LAD  . CARDIAC CATHETERIZATION N/A 11/28/2015   Procedure: Coronary/Graft Angiography;  Surgeon: [name], MD;  Location: [place] [facility];  Service: Cardiovascular;  Laterality: N/A;  coronaries only   . CARDIAC CATHETERIZATION N/A 04/21/2016   Procedure: Left Heart Cath and Coronary Angiography;  Surgeon: Rogue Jury  Ferne Reus, MD;  Location: [place] [facility];  Service: Cardiovascular;  Laterality: N/A;  . CARDIAC CATHETERIZATION N/A 06/14/2016   Procedure: Left Heart Cath and Cors/Grafts Angiography;  Surgeon: Lorretta Harp, MD;  Location: [place] [facility];  Service: Cardiovascular;  Laterality: N/A;  . CARDIAC CATHETERIZATION N/A 09/08/2016   Procedure: Left Heart Cath and Cors/Grafts Angiography;  Surgeon: Wellington Hampshire, MD;  Location: [place] [facility];  Service: Cardiovascular;  Laterality: N/A;  . CORONARY ARTERY BYPASS GRAFT N/A 11/28/2015   Procedure: CORONARY ARTERY BYPASS GRAFTING (CABG) TIMES FIVE USING LEFT INTERNAL MAMMARY ARTERY AND RIGHT GREATER SAPHENOUS,VIEN HARVEATED BY ENDOVIEN, INTRAOPPRATIVE TEE;  Surgeon: Gaye Pollack, MD;  Location: [place];   Service: Open Heart Surgery;  Laterality: N/A;  . PERIPHERAL VASCULAR CATHETERIZATION N/A 06/14/2016   Procedure: Lower Extremity Angiography;  Surgeon: Lorretta Harp, MD;  Location: [place] [facility];  Service: Cardiovascular;  Laterality: N/A;  . SHOULDER SURGERY       Current Outpatient Prescriptions  Medication Sig Dispense Refill  . aluminum hydroxide-magnesium carbonate (GAVISCON) 95-358 MG/15ML SUSP Take 30 mLs by mouth as needed for indigestion or heartburn.    Marland Kitchen aspirin 81 MG tablet Take 81 mg by mouth daily.    Marland Kitchen atorvastatin (LIPITOR) 80 MG tablet Take 1 tablet (80 mg total) by mouth daily. 30 tablet 2  . chlordiazePOXIDE (LIBRIUM) 10 MG capsule Take 10 mg by mouth daily as needed for anxiety.     . clopidogrel (PLAVIX) 75 MG tablet Take 75 mg by mouth daily.    Marland Kitchen DM-Doxylamine-Acetaminophen (NYQUIL COLD & FLU PO) Take 1 capsule by mouth at bedtime as needed (cold symptoms).    . ezetimibe (ZETIA) 10 MG tablet Take 1 tablet (10 mg total) by mouth daily. 30 tablet 11  . fluticasone (FLONASE) 50 MCG/ACT nasal spray Place 2 sprays into both nostrils daily.    Marland Kitchen lisinopril (PRINIVIL,ZESTRIL) 5 MG tablet Take 1 tablet daily. May take additional tablet once daily for SBP greater than 150 one hour after taking daily dose. 135 tablet 3  . metoprolol tartrate (LOPRESSOR) 25 MG tablet Take 1 tablet (25 mg total) by mouth 2 (two) times daily. 60 tablet 2  . Multiple Vitamins-Minerals (ONE-A-DAY MENS 50+ ADVANTAGE PO) Take 1 tablet by mouth daily.     . nitroGLYCERIN (NITROSTAT) 0.4 MG SL tablet Place 0.4 mg under the tongue every 5 (five) minutes as needed for chest pain. 3 DOSE MAX    . ranitidine (ZANTAC) 150 MG tablet Take 150 mg by mouth daily as needed for heartburn.     . traZODone (DESYREL) 50 MG tablet 50 mg as needed.     No current facility-administered medications for this visit.     Allergies:   Prednisone; Tetanus toxoids; Wellbutrin [bupropion]; and Chantix [varenicline]      Social History:  The patient  reports that he quit smoking about 13 months ago. His smoking use included Cigarettes. He smoked 0.50 packs per day. He has never used smokeless tobacco. He reports that he does not drink alcohol or use drugs.   Family History:  The patient's family history includes Heart Problems in his father; Heart attack in his maternal grandmother and paternal uncle; Heart attack (age of onset: 29) in his father; Hypertension in his brother; Lung cancer in his mother; Stroke in his father and maternal grandmother.    ROS:  Please see the history of present illness.   Otherwise, review of systems  are positive for increased BP with activity.   All other systems are reviewed and negative.    PHYSICAL EXAM: VS:  BP 132/84   Pulse 79   Ht 5' 8.75" (1.746 m)   Wt 192 lb (87.1 kg)   SpO2 99%   BMI 28.56 kg/m  , BMI Body mass index is 28.56 kg/m. GEN: Well nourished, well developed, in no acute distress  HEENT: normal  Neck: no JVD, carotid bruits, or masses Cardiac: RRR; no murmurs, rubs, or gallops,no edema  Respiratory:  clear to auscultation bilaterally, normal work of breathing GI: soft, nontender, nondistended, + BS MS: no deformity or atrophy  Skin: warm and dry, no rash Neuro:  Strength and sensation are intact Psych: euthymic mood, full affect     Recent Labs: 06/14/2016: Magnesium 2.2 11/09/2016: ALT 15 11/10/2016: BUN 21; Creatinine, Ser 1.17; Hemoglobin 12.6; Platelets 169; Potassium 3.5; Sodium 137   Lipid Panel    Component Value Date/Time   CHOL 108 11/10/2016 0207   CHOL 130 09/30/2016 0939   TRIG 126 11/10/2016 0207   HDL 34 (L) 11/10/2016 0207   HDL 55 09/30/2016 0939   CHOLHDL 3.2 11/10/2016 0207   VLDL 25 11/10/2016 0207   LDLCALC 49 11/10/2016 0207   LDLCALC 54 09/30/2016 0939     Other studies Reviewed: Additional studies/ records that were reviewed today with results demonstrating: cath records.  Blood work has been normal .  No  TSH checked.   ASSESSMENT AND PLAN:  1. CAD/MI: No angina.  No heart failure.  Continue secondary prevention.  Recent neuro event is his most pressing concern now.  He has neuro f/u in the next 2 days. 2. Hyperlipidemia:  LDL controlled.  Continue lipid lowering therapy.  Results from 3/18 reviewed. 3. Depression:  No longer on Cymbalta.  Having neuro testing.  He is sleeping excessively at times as well.  He does not feel depressed.  He thinks that this may be related to the stroke like illness.   4. SHOB: Improved.  Increase exercise gradually.   He thinks this is from allergies.  OK to increase exercise and add weights.  Avoid straining.    Current medicines are reviewed at length with the patient today.  The patient concerns regarding his medicines were addressed.  The following changes have been made:  No change  Labs/ tests ordered today include:  No orders of the defined types were placed in this encounter.   Recommend 150 minutes/week of aerobic exercise Low fat, low carb, high fiber diet recommended  Disposition:   FU in 6 months   Signed, Larae Grooms, MD  12/28/2016 9:45 AM    [facility] [place], [place], [place]  [address] Phone: [phone]; Fax: [phone]

## 2016-12-28 NOTE — Patient Instructions (Signed)
Medication Instructions:  Your physician recommends that you continue on your current medications as directed. Please refer to the Current Medication list given to you today.   Labwork: None ordered  Testing/Procedures: None ordered  Follow-Up: Your physician wants you to follow-up in: 6 months with Dr. Varanasi. You will receive a reminder letter in the mail two months in advance. If you don't receive a letter, please call our office to schedule the follow-up appointment.   Any Other Special Instructions Will Be Listed Below (If Applicable).     If you need a refill on your cardiac medications before your next appointment, please call your pharmacy.   
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
## 2016-12-30 ENCOUNTER — Encounter: Payer: BC Managed Care – PPO | Admitting: Psychology

## 2016-12-31 ENCOUNTER — Telehealth: Payer: Self-pay | Admitting: *Deleted

## 2016-12-31 NOTE — Telephone Encounter (Signed)
Pre Dr Erlinda Hong, LVM informing patient the cardiac monitoring test done recently was unremarkable. Advised he continue with the current treatment plan. Reviewed Daun Peacock NP's plan per her last office note. Advised if he has any questions, call back before the office closes at noon today. Left number.

## 2017-01-03 ENCOUNTER — Telehealth: Payer: Self-pay | Admitting: Interventional Cardiology

## 2017-01-03 IMAGING — DX DG CHEST 1V PORT
1 series · 1 of 1 positions shown · non-contrast
Comparison: 01/03/2015

CLINICAL DATA: Chest pain starting this morning

EXAM:
PORTABLE CHEST - 1 VIEW

[chest ap]
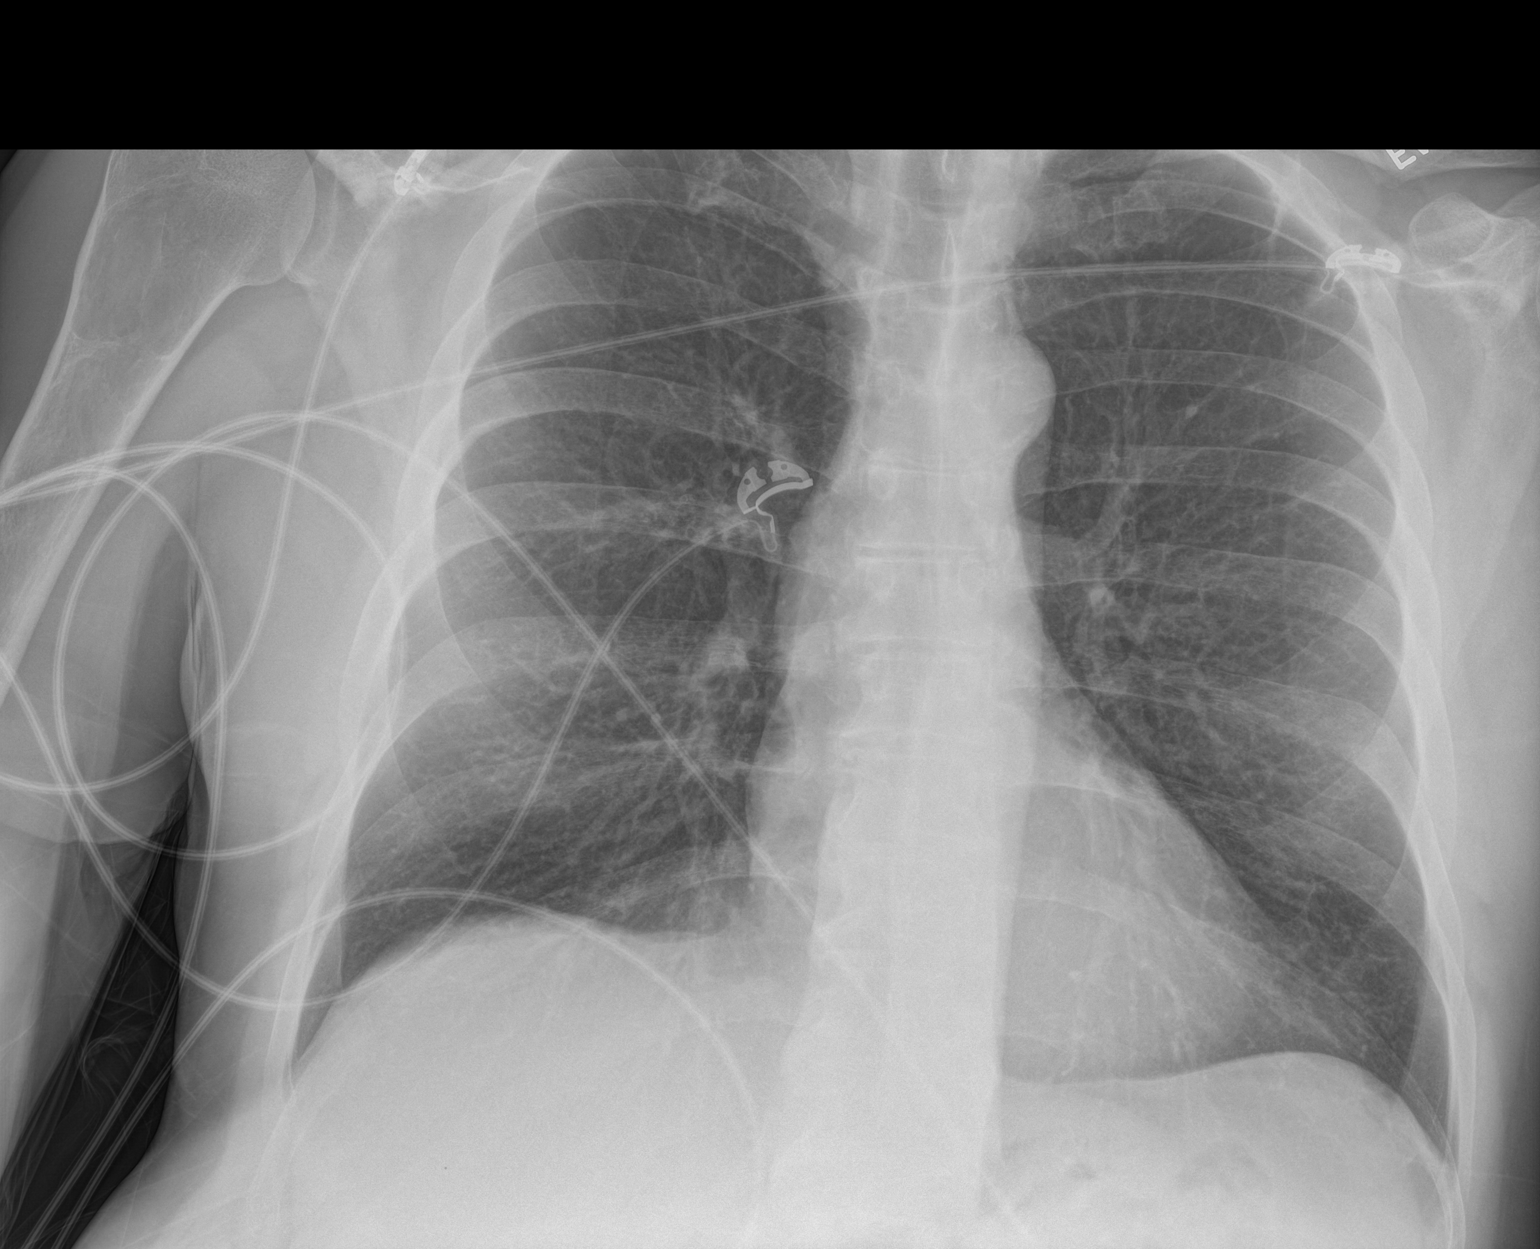

[1 of 1 positions shown; findings below may reference images not displayed]

FINDINGS: Cardiomediastinal silhouette is stable. No acute infiltrate or
pleural effusion. No pulmonary edema. There is a lytic lesion in
right humeral neck with some cortical thinning. The lesion measures
at least 4.1 by 3 cm. No aggressive features are noted. There is no
cortical destruction. No pathologic fracture. The lesion may
represent aneurysmal bone cyst. Further correlation with MRI is
recommended as clinically warranted.
IMPRESSION: No active disease. Lytic lesion in right humeral neck without
aggressive features measures about 4.1 x 3 cm. This may represent
aneurysmal bone cyst. Further evaluation with MRI is recommended as
clinically warranted.

## 2017-01-03 NOTE — Telephone Encounter (Signed)
New Message:    Please call,pt feels lightheaded and nauseated when he walks. Also had  a  little chest  pain at the end of his walk.Pt is very concerned abou what ist going on,his blood pressure also went up real high,it was 190/120.It is now 184/103.

## 2017-01-03 NOTE — Telephone Encounter (Signed)
OK.  Agree with recs.  He has had issues with anxiety and depression since the MI/CABG.  He has had several repeat caths since the initial surgery.  Sometimes, he benefits from reassurance.

## 2017-01-03 NOTE — Progress Notes (Deleted)
Cardiology Office Note   Date:  01/03/2017   ID:  [name], DOB [birth year], MRN [id number]  PCP:  [name], MD  Cardiologist:  Dr. Irish Lack    No chief complaint on file.     History of Present Illness: [name] is a 61 y.o. male who presents for chest pai and elevated BP   history of CAD (STEMI 10/2015 s/p POBA of LAD with IABP and subsequent emergent CABG), HCV s/p treatment, prior tobacco abuse, GERD, anxiety, chronic systolic CHF (most recent EF 45-50%), tobacco abuse who presents for f/u. He was previously seen in the pharmacist-driven HTN clinic for BP med adjustment at which time it was noted his home cuff was overestimating systolic blood pressure by 10-15 points. Last echo 12/2015 to re-eval EF showed improvement to 45-50%.   Last cath in Jan 2018  For chest pain,  patent grafts except for VG to diag which is known to be occluded from before.  Stable moderated in VG to Om.  Mild progression of prox SVG to Rt PDA - the RCA artery has only moderate nonobstructive disease.  EF was 50%.  Metoprolol was added.   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    ** echo March with EF 55-60% WM was normal.  G1DD. Trace MR and TR   holter with SR SB intermittently lowest HR 41, no symptoms with brady.   Past Medical History:  Diagnosis Date  . Acute hepatitis C without mention of hepatic coma(070.51)    Tx by [facility] for Hep.  negative for hep c 2/15.  Marland Kitchen Anxiety   . CAD (coronary artery disease)    a. 10/2015 ant STEMI >> LHC with 3 v CAD; oLAD tx with POBA >> emergent CABG.  . Chronic systolic CHF (congestive heart failure) ([place])   . Colitis   . Dyspnea   . Esophageal reflux    eosinophil esophagitis  . Former tobacco use   . Hypertension   . Ischemic cardiomyopathy    a. EF 25-30% at intraop TEE 4/17  //  b. Limited Echo 5/17 - EF 45-50%, mild ant HK  . Sinus bradycardia    a. HR dropping into 40s in 02/2016 -> BB reduced.  . Symptomatic hypotension     a. 02/2016 ER visit -> meds reduced.    Past Surgical History:  Procedure Laterality Date  . CARDIAC CATHETERIZATION N/A 11/28/2015   Procedure: Left Heart Cath and Coronary Angiography;  Surgeon: [name], MD;  Location: [place] [facility];  Service: Cardiovascular;  Laterality: N/A;  . CARDIAC CATHETERIZATION N/A 11/28/2015   Procedure: Coronary Balloon Angioplasty;  Surgeon: [name], MD;  Location: [place] [facility];  Service: Cardiovascular;  Laterality: N/A;  ostial LAD  . CARDIAC CATHETERIZATION N/A 11/28/2015   Procedure: Coronary/Graft Angiography;  Surgeon: [name], MD;  Location: [place] [facility];  Service: Cardiovascular;  Laterality: N/A;  coronaries only   . CARDIAC CATHETERIZATION N/A 04/21/2016   Procedure: Left Heart Cath and Coronary Angiography;  Surgeon: Wellington Hampshire, MD;  Location: [facility] [place] [facility];  Service: Cardiovascular;  Laterality: N/A;  . CARDIAC CATHETERIZATION N/A 06/14/2016   Procedure: Left Heart Cath and Cors/Grafts Angiography;  Surgeon: Lorretta Harp, MD;  Location: [facility];  Service: Cardiovascular;  Laterality: N/A;  . CARDIAC CATHETERIZATION N/A 09/08/2016   Procedure: Left Heart Cath and Cors/Grafts Angiography;  Surgeon: Wellington Hampshire, MD;  Location: [place] [facility];  Service: Cardiovascular;  Laterality: N/A;  . CORONARY ARTERY BYPASS GRAFT N/A 11/28/2015   Procedure: CORONARY ARTERY BYPASS GRAFTING (CABG) TIMES FIVE USING LEFT INTERNAL MAMMARY ARTERY AND RIGHT GREATER SAPHENOUS,VIEN HARVEATED BY ENDOVIEN, INTRAOPPRATIVE TEE;  Surgeon: Gaye Pollack, MD;  Location: [facility];  Service: Open Heart Surgery;  Laterality: N/A;  . PERIPHERAL VASCULAR CATHETERIZATION N/A 06/14/2016   Procedure: Lower Extremity Angiography;  Surgeon: Lorretta Harp, MD;  Location: [facility] [place] [facility];  Service: Cardiovascular;  Laterality: N/A;  . SHOULDER SURGERY       Current Outpatient Prescriptions  Medication  Sig Dispense Refill  . aluminum hydroxide-magnesium carbonate (GAVISCON) 95-358 MG/15ML SUSP Take 30 mLs by mouth as needed for indigestion or heartburn.    Marland Kitchen aspirin 81 MG tablet Take 81 mg by mouth daily.    Marland Kitchen atorvastatin (LIPITOR) 80 MG tablet Take 1 tablet (80 mg total) by mouth daily. 30 tablet 2  . chlordiazePOXIDE (LIBRIUM) 10 MG capsule Take 10 mg by mouth daily as needed for anxiety.     . clopidogrel (PLAVIX) 75 MG tablet Take 75 mg by mouth daily.    Marland Kitchen DM-Doxylamine-Acetaminophen (NYQUIL COLD & FLU PO) Take 1 capsule by mouth at bedtime as needed (cold symptoms).    . ezetimibe (ZETIA) 10 MG tablet Take 1 tablet (10 mg total) by mouth daily. 30 tablet 11  . fluticasone (FLONASE) 50 MCG/ACT nasal spray Place 2 sprays into both nostrils daily.    Marland Kitchen lisinopril (PRINIVIL,ZESTRIL) 5 MG tablet Take 1 tablet daily. May take additional tablet once daily for SBP greater than 150 one hour after taking daily dose. 135 tablet 3  . metoprolol tartrate (LOPRESSOR) 25 MG tablet Take 1 tablet (25 mg total) by mouth 2 (two) times daily. 60 tablet 2  . Multiple Vitamins-Minerals (ONE-A-DAY MENS 50+ ADVANTAGE PO) Take 1 tablet by mouth daily.     . nitroGLYCERIN (NITROSTAT) 0.4 MG SL tablet Place 0.4 mg under the tongue every 5 (five) minutes as needed for chest pain. 3 DOSE MAX    . ranitidine (ZANTAC) 150 MG tablet Take 150 mg by mouth daily as needed for heartburn.     . traZODone (DESYREL) 50 MG tablet 50 mg as needed.     No current facility-administered medications for this visit.     Allergies:   Prednisone; Tetanus toxoids; Wellbutrin [bupropion]; and Chantix [varenicline]    Social History:  The patient  reports that he quit smoking about 13 months ago. His smoking use included Cigarettes. He smoked 0.50 packs per day. He has never used smokeless tobacco. He reports that he does not drink alcohol or use drugs.   Family History:  The patient's ***family history includes Heart Problems in his  father; Heart attack in his maternal grandmother and paternal uncle; Heart attack (age of onset: 33) in his father; Hypertension in his brother; Lung cancer in his mother; Stroke in his father and maternal grandmother.    ROS:  General:no colds or fevers, no weight changes Skin:no rashes or ulcers HEENT:no blurred vision, no congestion CV:see HPI PUL:see HPI GI:no diarrhea constipation or melena, no indigestion GU:no hematuria, no dysuria MS:no joint pain, no claudication Neuro:no syncope, no lightheadedness Endo:no diabetes, no thyroid disease Wt Readings from Last 3 Encounters:  12/28/16 192 lb (87.1 kg)  12/22/16 193 lb 3.2 oz (87.6 kg)  11/10/16 185 lb (83.9 kg)     PHYSICAL EXAM: VS:  There were no vitals taken  for this visit. , BMI There is no height or weight on file to calculate BMI. General:Pleasant affect, NAD Skin:Warm and dry, brisk capillary refill HEENT:normocephalic, sclera clear, mucus membranes moist Neck:supple, no JVD, no bruits  Heart:S1S2 RRR without murmur, gallup, rub or click Lungs:clear without rales, rhonchi, or wheezes LKG:MWNU, non tender, + BS, do not palpate liver spleen or masses Ext:no lower ext edema, 2+ pedal pulses, 2+ radial pulses Neuro:alert and oriented, MAE, follows commands, + facial symmetry    EKG:  EKG is ordered today. The ekg ordered today demonstrates ***   Recent Labs: 06/14/2016: Magnesium 2.2 11/09/2016: ALT 15 11/10/2016: BUN 21; Creatinine, Ser 1.17; Hemoglobin 12.6; Platelets 169; Potassium 3.5; Sodium 137    Lipid Panel    Component Value Date/Time   CHOL 108 11/10/2016 0207   CHOL 130 09/30/2016 0939   TRIG 126 11/10/2016 0207   HDL 34 (L) 11/10/2016 0207   HDL 55 09/30/2016 0939   CHOLHDL 3.2 11/10/2016 0207   VLDL 25 11/10/2016 0207   LDLCALC 49 11/10/2016 0207   LDLCALC 54 09/30/2016 0939       Other studies Reviewed: Additional studies/ records that were reviewed today include: ***.   ASSESSMENT  AND PLAN:  1.  ***   Current medicines are reviewed with the patient today.  The patient Has no concerns regarding medicines.  The following changes have been made:  See above Labs/ tests ordered today include:see above  Disposition:   FU:  see above  Signed, Cecilie Kicks, NP  01/03/2017 8:53 PM    [facility] [place], [place], [place] [place], [place] Phone: [phone]; Fax: [phone]

## 2017-01-03 NOTE — Telephone Encounter (Signed)
Patient is complaining of chest pain and elevated BP. Patient stated his BP went up while taking a brisk walk. Patient stated he had some chest pain this morning and it went away with one nitro. Patient stated he also took an extra lisinopril. Encouraged patient to take another nitro while on the phone with him. Patient stated that his CP felt better, but he still had some tightness. Patient stated his BP went down to 130/92 HR 80. Made an appointment to see Cecilie Kicks NP tomorrow at 11:00. Encouraged patient to call 911 if his chest pain came back or he felt worse. Encouraged patient to take it easy and not to exert himself before his appointment tomorrow. Will forward to Dr. Irish Lack for further advisement.

## 2017-01-04 ENCOUNTER — Encounter (HOSPITAL_COMMUNITY): Payer: Self-pay

## 2017-01-04 ENCOUNTER — Ambulatory Visit: Payer: BC Managed Care – PPO | Admitting: Cardiology

## 2017-01-04 ENCOUNTER — Observation Stay (HOSPITAL_COMMUNITY)
Admission: EM | Admit: 2017-01-04 | Discharge: 2017-01-05 | Disposition: A | Discharge status: Home / Self Care | Payer: BC Managed Care – PPO | Attending: Cardiovascular Disease | Admitting: Cardiovascular Disease

## 2017-01-04 ENCOUNTER — Telehealth: Payer: Self-pay | Admitting: Cardiology

## 2017-01-04 ENCOUNTER — Emergency Department (HOSPITAL_COMMUNITY): Payer: BC Managed Care – PPO

## 2017-01-04 DIAGNOSIS — Z87891 Personal history of nicotine dependence: Secondary | ICD-10-CM | POA: Insufficient documentation

## 2017-01-04 DIAGNOSIS — I5022 Chronic systolic (congestive) heart failure: Secondary | ICD-10-CM | POA: Diagnosis not present

## 2017-01-04 DIAGNOSIS — K219 Gastro-esophageal reflux disease without esophagitis: Secondary | ICD-10-CM | POA: Insufficient documentation

## 2017-01-04 DIAGNOSIS — I255 Ischemic cardiomyopathy: Secondary | ICD-10-CM | POA: Insufficient documentation

## 2017-01-04 DIAGNOSIS — Z7982 Long term (current) use of aspirin: Secondary | ICD-10-CM | POA: Diagnosis not present

## 2017-01-04 DIAGNOSIS — I252 Old myocardial infarction: Secondary | ICD-10-CM | POA: Insufficient documentation

## 2017-01-04 DIAGNOSIS — R079 Chest pain, unspecified: Secondary | ICD-10-CM | POA: Diagnosis not present

## 2017-01-04 DIAGNOSIS — Z7951 Long term (current) use of inhaled steroids: Secondary | ICD-10-CM | POA: Insufficient documentation

## 2017-01-04 DIAGNOSIS — R0789 Other chest pain: Secondary | ICD-10-CM | POA: Diagnosis not present

## 2017-01-04 DIAGNOSIS — Z951 Presence of aortocoronary bypass graft: Secondary | ICD-10-CM | POA: Diagnosis not present

## 2017-01-04 DIAGNOSIS — F419 Anxiety disorder, unspecified: Secondary | ICD-10-CM | POA: Insufficient documentation

## 2017-01-04 DIAGNOSIS — E7800 Pure hypercholesterolemia, unspecified: Secondary | ICD-10-CM | POA: Insufficient documentation

## 2017-01-04 DIAGNOSIS — Z8249 Family history of ischemic heart disease and other diseases of the circulatory system: Secondary | ICD-10-CM | POA: Insufficient documentation

## 2017-01-04 DIAGNOSIS — F3160 Bipolar disorder, current episode mixed, unspecified: Secondary | ICD-10-CM | POA: Insufficient documentation

## 2017-01-04 DIAGNOSIS — I700 Atherosclerosis of aorta: Secondary | ICD-10-CM | POA: Diagnosis not present

## 2017-01-04 DIAGNOSIS — I110 Hypertensive heart disease with heart failure: Secondary | ICD-10-CM | POA: Insufficient documentation

## 2017-01-04 DIAGNOSIS — I2510 Atherosclerotic heart disease of native coronary artery without angina pectoris: Secondary | ICD-10-CM | POA: Insufficient documentation

## 2017-01-04 DIAGNOSIS — M797 Fibromyalgia: Secondary | ICD-10-CM | POA: Insufficient documentation

## 2017-01-04 DIAGNOSIS — Z7902 Long term (current) use of antithrombotics/antiplatelets: Secondary | ICD-10-CM | POA: Diagnosis not present

## 2017-01-04 DIAGNOSIS — B1920 Unspecified viral hepatitis C without hepatic coma: Secondary | ICD-10-CM | POA: Diagnosis not present

## 2017-01-04 LAB — BASIC METABOLIC PANEL
Anion gap: 8 (ref 5–15)
BUN: 16 mg/dL (ref 6–20)
CO2: 27 mmol/L (ref 22–32)
Calcium: 9.2 mg/dL (ref 8.9–10.3)
Chloride: 107 mmol/L (ref 101–111)
Creatinine, Ser: 1.11 mg/dL (ref 0.61–1.24)
GFR calc Af Amer: >60 mL/min (ref >60)
GFR calc non Af Amer: >60 mL/min (ref >60)
Glucose, Bld: 101 mg/dL — ABNORMAL HIGH (ref 65–99)
Potassium: 4.3 mmol/L (ref 3.5–5.1)
Sodium: 142 mmol/L (ref 135–145)

## 2017-01-04 LAB — CBC
HCT: 38.7 % — ABNORMAL LOW (ref 39.0–52.0)
HCT: 39.8 % (ref 39.0–52.0)
Hemoglobin: 12.7 g/dL — ABNORMAL LOW (ref 13.0–17.0)
Hemoglobin: 13 g/dL (ref 13.0–17.0)
MCH: 30.1 pg (ref 26.0–34.0)
MCH: 30.1 pg (ref 26.0–34.0)
MCHC: 32.8 g/dL (ref 30.0–36.0)
MCHC: 32.7 g/dL (ref 30.0–36.0)
MCV: 91.7 fL (ref 78.0–100.0)
MCV: 92.1 fL (ref 78.0–100.0)
Platelets: 177 10*3/uL (ref 150–400)
Platelets: 170 10*3/uL (ref 150–400)
RBC: 4.22 MIL/uL (ref 4.22–5.81)
RBC: 4.32 MIL/uL (ref 4.22–5.81)
RDW: 12.9 % (ref 11.5–15.5)
RDW: 12.9 % (ref 11.5–15.5)
WBC: 7.7 10*3/uL (ref 4.0–10.5)
WBC: 10.3 10*3/uL (ref 4.0–10.5)

## 2017-01-04 LAB — BRAIN NATRIURETIC PEPTIDE: B Natriuretic Peptide: 110.6 pg/mL — ABNORMAL HIGH (ref 0.0–100.0)

## 2017-01-04 LAB — CREATININE, SERUM
Creatinine, Ser: 1.03 mg/dL (ref 0.61–1.24)
GFR calc Af Amer: >60 mL/min (ref >60)
GFR calc non Af Amer: >60 mL/min (ref >60)

## 2017-01-04 LAB — I-STAT TROPONIN, ED: Troponin i, poc: 0 ng/mL (ref 0.00–0.08)

## 2017-01-04 LAB — TROPONIN I: Troponin I: <0.03 ng/mL (ref <0.03)

## 2017-01-04 MED ORDER — ADULT MULTIVITAMIN W/MINERALS CH
1.0000 | ORAL_TABLET | Freq: Every day | ORAL | Status: DC
  Administered 2017-01-04 – 2017-01-05 (×2): 1 via ORAL
  Filled 2017-01-04 (×2): qty 1

## 2017-01-04 MED ORDER — ENOXAPARIN SODIUM 40 MG/0.4ML ~~LOC~~ SOLN
40.0000 mg | SUBCUTANEOUS | Status: DC
  Administered 2017-01-04: 40 mg via SUBCUTANEOUS
  Filled 2017-01-04: qty 0.4

## 2017-01-04 MED ORDER — ONDANSETRON HCL 4 MG/2ML IJ SOLN: 4.0000 mg | Freq: Four times a day (QID) | INTRAMUSCULAR | Status: DC | PRN

## 2017-01-04 MED ORDER — AMLODIPINE BESYLATE 5 MG PO TABS
5.0000 mg | ORAL_TABLET | Freq: Every day | ORAL | Status: DC
  Administered 2017-01-04 – 2017-01-05 (×2): 5 mg via ORAL
  Filled 2017-01-04 (×2): qty 1

## 2017-01-04 MED ORDER — FAMOTIDINE 20 MG PO TABS
20.0000 mg | ORAL_TABLET | Freq: Every day | ORAL | Status: DC
  Administered 2017-01-04 – 2017-01-05 (×2): 20 mg via ORAL
  Filled 2017-01-04 (×2): qty 1

## 2017-01-04 MED ORDER — METOPROLOL TARTRATE 25 MG PO TABS
25.0000 mg | ORAL_TABLET | Freq: Two times a day (BID) | ORAL | Status: DC
  Administered 2017-01-04 – 2017-01-05 (×2): 25 mg via ORAL
  Filled 2017-01-04 (×2): qty 1

## 2017-01-04 MED ORDER — EZETIMIBE 10 MG PO TABS
10.0000 mg | ORAL_TABLET | Freq: Every day | ORAL | Status: DC
  Administered 2017-01-04 – 2017-01-05 (×2): 10 mg via ORAL
  Filled 2017-01-04 (×2): qty 1

## 2017-01-04 MED ORDER — FLUTICASONE PROPIONATE 50 MCG/ACT NA SUSP
2.0000 | Freq: Every day | NASAL | Status: DC
  Filled 2017-01-04: qty 16

## 2017-01-04 MED ORDER — CHLORDIAZEPOXIDE HCL 5 MG PO CAPS: 10.0000 mg | ORAL_CAPSULE | Freq: Every day | ORAL | Status: DC | PRN

## 2017-01-04 MED ORDER — MORPHINE SULFATE (PF) 4 MG/ML IV SOLN
2.0000 mg | Freq: Once | INTRAVENOUS | Status: AC
  Administered 2017-01-04: 2 mg via INTRAVENOUS
  Filled 2017-01-04: qty 1

## 2017-01-04 MED ORDER — ATORVASTATIN CALCIUM 80 MG PO TABS
80.0000 mg | ORAL_TABLET | Freq: Every day | ORAL | Status: DC
  Administered 2017-01-04: 80 mg via ORAL
  Filled 2017-01-04: qty 1

## 2017-01-04 MED ORDER — CLOPIDOGREL BISULFATE 75 MG PO TABS
75.0000 mg | ORAL_TABLET | Freq: Every day | ORAL | Status: DC
  Administered 2017-01-04 – 2017-01-05 (×2): 75 mg via ORAL
  Filled 2017-01-04 (×2): qty 1

## 2017-01-04 MED ORDER — ASPIRIN 81 MG PO CHEW
81.0000 mg | CHEWABLE_TABLET | Freq: Every day | ORAL | Status: DC
  Administered 2017-01-04 – 2017-01-05 (×2): 81 mg via ORAL
  Filled 2017-01-04 (×2): qty 1

## 2017-01-04 MED ORDER — ACETAMINOPHEN 325 MG PO TABS: 650.0000 mg | ORAL_TABLET | ORAL | Status: DC | PRN

## 2017-01-04 MED ORDER — NITROGLYCERIN 0.4 MG SL SUBL
0.4000 mg | SUBLINGUAL_TABLET | SUBLINGUAL | Status: DC | PRN
  Administered 2017-01-04 (×3): 0.4 mg via SUBLINGUAL
  Filled 2017-01-04 (×2): qty 1

## 2017-01-04 MED ORDER — ALUM & MAG HYDROXIDE-SIMETH 200-200-20 MG/5ML PO SUSP: 30.0000 mL | ORAL | Status: DC | PRN

## 2017-01-04 MED ORDER — TRAZODONE HCL 50 MG PO TABS
50.0000 mg | ORAL_TABLET | Freq: Every evening | ORAL | Status: DC | PRN
  Administered 2017-01-04: 50 mg via ORAL
  Filled 2017-01-04: qty 1

## 2017-01-04 MED ORDER — FENTANYL CITRATE (PF) 100 MCG/2ML IJ SOLN
50.0000 ug | Freq: Once | INTRAMUSCULAR | Status: AC
  Administered 2017-01-04: 50 ug via INTRAVENOUS
  Filled 2017-01-04: qty 2

## 2017-01-04 MED ORDER — MORPHINE SULFATE (PF) 4 MG/ML IV SOLN
2.0000 mg | INTRAVENOUS | Status: DC | PRN
  Administered 2017-01-04 – 2017-01-05 (×4): 2 mg via INTRAVENOUS
  Filled 2017-01-04 (×4): qty 1

## 2017-01-04 MED ORDER — LISINOPRIL 5 MG PO TABS
5.0000 mg | ORAL_TABLET | Freq: Every day | ORAL | Status: DC
Start: 2017-01-04 — End: 2017-01-05
  Administered 2017-01-04 – 2017-01-05 (×2): 5 mg via ORAL
  Filled 2017-01-04 (×2): qty 1

## 2017-01-04 NOTE — ED Notes (Signed)
Got patient undressed on the monitor did ekg shown to Dr Vanita Panda patient is resting

## 2017-01-04 NOTE — ED Notes (Signed)
Pt did not need anything at this time  

## 2017-01-04 NOTE — ED Triage Notes (Signed)
Pt arrives EMS with c/o chest pain sharp at left chest into back and neck. Started at 0500. Took 2 nitro sl with relief but pain returns. Given 500 cc bolus by EMS after additional 2 nitro without relief but decreased BP. Chest pain currently 5/10.

## 2017-01-04 NOTE — ED Provider Notes (Signed)
Received sign out from [place]. Referred to her note for full history and physical . Briefly, patient is a 61 year old male with history of CAD status post CABG with chief complaint chest pain which began yesterday . Improved with home nitroglycerin but then worsened. He states he was hypertensive this morning. He called his cardiology clinic who recommended that he come into the emergency department. Cardiology has been consulted and will evaluate the patient; awaiting their recommendation.    Physical Exam  BP 124/70   Pulse (!) 51   Resp 14   Ht '5\' 9"'$  (1.753 m)   Wt 84.8 kg   SpO2 97%   BMI 27.62 kg/m   Physical Exam  Constitutional: He is oriented to person, place, and time. He appears well-developed and well-nourished. No distress.  HENT:  Head: Normocephalic and atraumatic.  Eyes: Conjunctivae are normal.  Neck: Neck supple.  Cardiovascular: Normal rate, regular rhythm and intact distal pulses.   No murmur heard. 2+ radial and DP/PT pulses bl, negative Homan's bl   Pulmonary/Chest: Effort normal and breath sounds normal. No respiratory distress. He exhibits no tenderness.  Well-healed midline sternotomy scar  Abdominal: Soft. There is no tenderness.  Musculoskeletal: Normal range of motion. He exhibits no edema.  Neurological: He is alert and oriented to person, place, and time.  Skin: Skin is warm and dry. Capillary refill takes less than 2 seconds.  Psychiatric: He has a normal mood and affect.  Nursing note and vitals reviewed.   ED Course  Procedures  MDM  Patient with extensive cardiac history presents with chief complaint chest pain since yesterday. EKG unchanged from prior, negative troponin, mildly elevated BNP. On reevaluation patient states chest pain is persisting. Dr. Burt Knack from cardiology and spoke with and evaluated patient, assumed care of the patient and will bring in to the hospital for observation overnight.          Renita Papa,  PA-C 01/05/17 [place], Martha, MD 01/08/17 [phone]

## 2017-01-04 NOTE — Telephone Encounter (Addendum)
Received call from patient c/o continued CP and elevated BP this morning after exercise (see telephone note from 5/7).  Reports this AM his BP was 180/120 and was experiencing CP and SOB so he took 1 NTG and BP decreased and CP resolved.  Reports he went down the steps and started experiencing CP and elevated BP again.   Reports CP worse with exertion, better with rest, reports some SOB, left foot tingling, left arm pain, and neck pain this AM with nausea.  Reports he is feeling lightheaded and dizzy and doesn't think he can drive to his appt today at 11AM with Cecilie Kicks NP (no other openings today).   Reports CP 6/10 at current, SOB, and nausea with pain in left arm.    Hx: CAD, STEMI, CABG, HTN, CHF.   Advised to proceed to ER for further evaluation or call 911 if someone unable to drive.  Patient verbalized understanding.   Patient reports he doesn't feel capable of driving due to lightheadedness and dizziness.  Patient verbalized he will call EMS at this time.  Patient also reports he took 2nd NTG and pain decreased to 2/10. Advised to continue with plan at this time.  Trish made aware.

## 2017-01-04 NOTE — ED Provider Notes (Signed)
[facility] DEPT Provider Note   CSN: [id number] Arrival date & time: 01/04/17  1045     History   Chief Complaint Chief Complaint  Patient presents with  . Chest Pain    HPI [name] is a 61 y.o. male.  The history is provided by the patient and medical records.  Chest Pain      61 year old male with history of hepatitis C, anxiety, coronary artery disease status post CABG march 2017, colitis, GERD, HTN, ischemic cardiomyopathy, presenting to the ED with chest pain.  Patient states this actually began yesterday.  States he began feeling lightheaded and somewhat weak when walking. States he started developing some chest pain so he came back home. States he took his blood pressure noticed that it was elevated. States he took an extra dose of his lisinopril as well as some nitroglycerin with improvement of his chest pain, reports blood pressure also went down. Patient actually slept fairly well last night. He got up again this morning to go walk his dogs but when he went down the stairs started having some pain again.  States BP this morning was 180's/120's range.  States he went to sit down on the couch and took some nitroglycerin which improved his pain and lowered BP again.  States he called cardiology office and they encouraged him to be evaluated here.  EMS gave him 2 additional nausea) as well as aspirin without change in his symptoms. States pain is localized to lower mid chest, described as a sharp discomfort with some radiation into the left arm and neck. Reports his breathing does get labored when pain is severe. He denies any diaphoresis, nausea, vomiting, numbness, weakness. He is followed by cardiology, Dr. Scarlette Calico.  Past Medical History:  Diagnosis Date  . Acute hepatitis C without mention of hepatic coma(070.51)    Tx by [facility] for Hep.  negative for hep c 2/15.  Marland Kitchen Anxiety   . CAD (coronary artery disease)    a. 10/2015 ant STEMI >> LHC with 3 v CAD; oLAD tx with  POBA >> emergent CABG.  . Chronic systolic CHF (congestive heart failure) ([place])   . Colitis   . Dyspnea   . Esophageal reflux    eosinophil esophagitis  . Former tobacco use   . Hypertension   . Ischemic cardiomyopathy    a. EF 25-30% at intraop TEE 4/17  //  b. Limited Echo 5/17 - EF 45-50%, mild ant HK  . Sinus bradycardia    a. HR dropping into 40s in 02/2016 -> BB reduced.  . Symptomatic hypotension    a. 02/2016 ER visit -> meds reduced.    Patient Active Problem List   Diagnosis Date Noted  . Family hx-stroke 11/10/2016  . 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 Unstable angina ([place]) 09/07/2016  . Claudication of both lower extremities ([place])   . Pure hypercholesterolemia   . Tobacco abuse disorder   . Coronary artery disease involving coronary bypass graft of native heart with unstable angina pectoris ([place])   . Chest pain 06/10/2016  . Abnormal nuclear stress test - HIGH RISK 04/20/2016  . Old MI (myocardial infarction)   . Essential hypertension 02/26/2016  . Ischemic cardiomyopathy 12/25/2015  . Dyslipidemia, goal LDL below 70 12/25/2015  . STEMI (ST elevation myocardial infarction) ([place]) 11/28/2015  . 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 Coronary artery disease involving native coronary artery 11/28/2015  . 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  Acute MI anterior wall first episode care [facility])   . Chest pain with high risk for cardiac etiology 03/07/2015  . Dysphagia 03/07/2015  . Gout attack 03/07/2015  . Mixed bipolar I disorder ([place]) 03/07/2015  . Fibromyalgia 07/09/2014  . Gout 07/09/2014  . Anxiety 07/09/2014  . Depression 07/09/2014  . Hepatitis C 11/20/2012  . Eosinophilic esophagitis 82/50/5397    Past Surgical History:  Procedure Laterality Date  . CARDIAC CATHETERIZATION N/A 11/28/2015   Procedure: Left Heart Cath and Coronary Angiography;  Surgeon: [name], MD;  Location: [facility] LAB;  Service: Cardiovascular;   Laterality: N/A;  . CARDIAC CATHETERIZATION N/A 11/28/2015   Procedure: Coronary Balloon Angioplasty;  Surgeon: [name], MD;  Location: [place] [facility];  Service: Cardiovascular;  Laterality: N/A;  ostial LAD  . CARDIAC CATHETERIZATION N/A 11/28/2015   Procedure: Coronary/Graft Angiography;  Surgeon: [name], MD;  Location: [place] [facility];  Service: Cardiovascular;  Laterality: N/A;  coronaries only   . CARDIAC CATHETERIZATION N/A 04/21/2016   Procedure: Left Heart Cath and Coronary Angiography;  Surgeon: Wellington Hampshire, MD;  Location: [place] [facility];  Service: Cardiovascular;  Laterality: N/A;  . CARDIAC CATHETERIZATION N/A 06/14/2016   Procedure: Left Heart Cath and Cors/Grafts Angiography;  Surgeon: Lorretta Harp, MD;  Location: [place] [facility];  Service: Cardiovascular;  Laterality: N/A;  . CARDIAC CATHETERIZATION N/A 09/08/2016   Procedure: Left Heart Cath and Cors/Grafts Angiography;  Surgeon: Wellington Hampshire, MD;  Location: [place] [facility];  Service: Cardiovascular;  Laterality: N/A;  . CORONARY ARTERY BYPASS GRAFT N/A 11/28/2015   Procedure: CORONARY ARTERY BYPASS GRAFTING (CABG) TIMES FIVE USING LEFT INTERNAL MAMMARY ARTERY AND RIGHT GREATER SAPHENOUS,VIEN HARVEATED BY ENDOVIEN, INTRAOPPRATIVE TEE;  Surgeon: Gaye Pollack, MD;  Location: [facility];  Service: Open Heart Surgery;  Laterality: N/A;  . PERIPHERAL VASCULAR CATHETERIZATION N/A 06/14/2016   Procedure: Lower Extremity Angiography;  Surgeon: Lorretta Harp, MD;  Location: [place] [facility];  Service: Cardiovascular;  Laterality: N/A;  . SHOULDER SURGERY         Home Medications    Prior to Admission medications   Medication Sig Start Date End Date Taking? Authorizing Provider  aluminum hydroxide-magnesium carbonate (GAVISCON) 95-358 MG/15ML SUSP Take 30 mLs by mouth as needed for indigestion or heartburn.    [provider]  aspirin 81 MG tablet Take 81 mg by mouth daily.     [provider]  atorvastatin (LIPITOR) 80 MG tablet Take 1 tablet (80 mg total) by mouth daily. 11/10/16 02/08/17  Donzetta Starch, NP  chlordiazePOXIDE (LIBRIUM) 10 MG capsule Take 10 mg by mouth daily as needed for anxiety.     [provider]  clopidogrel (PLAVIX) 75 MG tablet Take 75 mg by mouth daily.    [provider]  DM-Doxylamine-Acetaminophen (NYQUIL COLD & FLU PO) Take 1 capsule by mouth at bedtime as needed (cold symptoms).    [provider]  ezetimibe (ZETIA) 10 MG tablet Take 1 tablet (10 mg total) by mouth daily. 08/12/16 12/28/16  [name], MD  fluticasone [facility]) 50 MCG/ACT nasal spray Place 2 sprays into both nostrils daily.    [provider]  lisinopril (PRINIVIL,ZESTRIL) 5 MG tablet Take 1 tablet daily. May take additional tablet once daily for SBP greater than 150 one hour after taking daily dose. 09/01/16   [name], MD  metoprolol tartrate (LOPRESSOR) 25 MG tablet Take 1  tablet (25 mg total) by mouth 2 (two) times daily. 11/10/16   Donzetta Starch, NP  Multiple Vitamins-Minerals (ONE-A-DAY MENS 50+ ADVANTAGE PO) Take 1 tablet by mouth daily.     [provider]  nitroGLYCERIN (NITROSTAT) 0.4 MG SL tablet Place 0.4 mg under the tongue every 5 (five) minutes as needed for chest pain. 3 DOSE MAX    [provider]  ranitidine (ZANTAC) 150 MG tablet Take 150 mg by mouth daily as needed for heartburn.     [provider]  traZODone (DESYREL) 50 MG tablet 50 mg as needed. 12/08/16   [provider]    Family History Family History  Problem Relation Age of Onset  . Lung cancer Mother   . Heart Problems Father   . Heart attack Father 73  . Stroke Father   . Heart attack Maternal Grandmother   . Stroke Maternal Grandmother   . Heart attack Paternal Uncle   . Hypertension Brother   . Autoimmune disease Neg Hx     Social History Social History  Substance Use Topics  .  Smoking status: Former Smoker    Packs/day: 0.50    Types: Cigarettes    Quit date: 11/28/2015  . Smokeless tobacco: Never Used     Comment: Quit smoking 10/2015 after MI  . Alcohol use No     Allergies   Prednisone; Tetanus toxoids; Wellbutrin [bupropion]; and Chantix [varenicline]   Review of Systems Review of Systems  Cardiovascular: Positive for chest pain.  All other systems reviewed and are negative.    Physical Exam Updated Vital Signs BP 111/68 (BP Location: Right Arm)   Pulse 60   Resp 16   Ht '5\' 9"'$  (1.753 m)   Wt 84.8 kg   SpO2 98%   BMI 27.62 kg/m   Physical Exam  Constitutional: He is oriented to person, place, and time. He appears well-developed and well-nourished.  HENT:  Head: Normocephalic and atraumatic.  Mouth/Throat: Oropharynx is clear and moist.  Eyes: Conjunctivae and EOM are normal. Pupils are equal, round, and reactive to light.  Neck: Normal range of motion.  Cardiovascular: Normal rate, regular rhythm and normal heart sounds.   Pulmonary/Chest: Effort normal and breath sounds normal.  Well-healed midline sternotomy scar, lungs clear, no distress  Abdominal: Soft. Bowel sounds are normal.  Musculoskeletal: Normal range of motion.  Neurological: He is alert and oriented to person, place, and time.  Skin: Skin is warm and dry.  Psychiatric: He has a normal mood and affect.  Nursing note and vitals reviewed.    ED Treatments / Results  Labs (all labs ordered are listed, but only abnormal results are displayed) Labs Reviewed  BASIC METABOLIC PANEL - Abnormal; Notable for the following:       Result Value   Glucose, Bld 101 (*)    All other components within normal limits  CBC - Abnormal; Notable for the following:    Hemoglobin 12.7 (*)    HCT 38.7 (*)    All other components within normal limits  BRAIN NATRIURETIC PEPTIDE - Abnormal; Notable for the following:    B Natriuretic Peptide 110.6 (*)    All other components within normal  limits  I-STAT TROPOININ, ED    EKG  EKG Interpretation  Date/Time:  Tuesday Jan 04 2017 10:47:43 EDT Ventricular Rate:  52 PR Interval:    QRS Duration: 99 QT Interval:  429 QTC Calculation: 399 R Axis:   83 Text Interpretation:  Sinus  rhythm Borderline right axis deviation Baseline wander in lead(s) V1 T wave abnormality Abnormal ekg Confirmed by Carmin Muskrat  MD [phone]) on 01/04/2017 10:53:44 AM       Radiology Dg Chest 2 View  Result Date: 01/04/2017 CLINICAL DATA:  Midline and LEFT side chest pain with tightness and burning with shortness of breath since this morning, LEFT side neck pain and shoulder pain, hypertension, former smoker EXAM: CHEST  2 VIEW COMPARISON:  11/02/2016 FINDINGS: Normal heart size post CABG. Mediastinal contours and pulmonary vascularity normal. Atherosclerotic calcification aorta. Lungs slightly hyperinflated but clear. No infiltrate, pleural effusion or pneumothorax. Bones demineralized. IMPRESSION: No acute abnormalities. Post CABG. Aortic atherosclerosis. Electronically Signed   By: Lavonia Dana M.D.   On: 01/04/2017 11:49    Procedures Procedures (including critical care time)  Medications Ordered in ED Medications  morphine 4 MG/ML injection 2 mg (2 mg Intravenous Given 01/04/17 1156)  fentaNYL (SUBLIMAZE) injection 50 mcg (50 mcg Intravenous Given 01/04/17 1333)     Initial Impression / Assessment and Plan / ED Course  I have reviewed the triage vital signs and the nursing notes.  Pertinent labs & imaging results that were available during my care of the patient were reviewed by me and considered in my medical decision making (see chart for details).  61 year old male here with intermittent, exertional chest pain over the past 2 days. He is afebrile and nontoxic here. EKG here grossly unchanged from prior. Screening lab work including troponin is reassuring. BNP mildly elevated at 110. Patient does not appear clinically fluid overloaded. Chest  x-ray is clear. Patient initially had some response to nitroglycerin, however reports recurrent pain unresponsive to additional nitroglycerin. He was given dose of morphine and fentanyl with improvement of his pain. Patient does have cardiac history including prior MI with CABG.  Discussed with cardiology, they will evaluate in the ED and disposition.  Final Clinical Impressions(s) / ED Diagnoses   Final diagnoses:  Chest pain, unspecified type    New Prescriptions New Prescriptions   No medications on file     Kathryne Hitch 01/04/17 1532    Carmin Muskrat, MD 01/09/17 0030

## 2017-01-04 NOTE — Progress Notes (Signed)
Pt arrived from Ed. Pt complaining of chest pain 3/10. Vital signs stable. ekg in chart. Pt placed on tele and oriented to room. MD notified. Will continue to monitor.

## 2017-01-04 NOTE — Telephone Encounter (Signed)
New Message:     Pt says he need some advice right away please. He is scheduled to see Mickel Baas today at 11:00,Pt is having chest pains at this minute, a little short of breath and blood pressure is all over the place.

## 2017-01-04 NOTE — ED Notes (Signed)
Report attempted 

## 2017-01-04 NOTE — H&P (Signed)
History and Physical  Patient ID: [name] MRN: [id number], SOB: 28-Feb-1956 61 y.o. Date of Encounter: 01/04/2017, 5:13 PM  Primary Physician: [name], MD Primary Cardiologist: Dr Irish Lack  Chief Complaint: Chest pain  HPI: 61 y.o. male w/ PMHx significant for CAD s/p CABG who presented to [facility] on 01/04/2017 with complaints of chest pain.  The patient has developed chronic recurrent chest pain since undergoing CABG one year ago. He has had 3 heart caths in 8/17, 10/17, and 1/18 demonstrating stable moderate multivessel CAD, and patent vein grafts with areas of nonobstructive disease. One diagonal graft is occluded.   The patient has been experiencing elevated BP and episodes of left sided and substernal chest pain. He has had chest discomfort associated with moderate exertion. Symptoms have been worse over the past 3-4 days. Also complains of fatigue and dyspnea. No orthopnea, PND, or palpitations. He has checked his BP during episodes of chest pain and BP has been elevated.   Past Medical History:  Diagnosis Date  . Acute hepatitis C without mention of hepatic coma(070.51)    Tx by [facility] for Hep.  negative for hep c 2/15.  Marland Kitchen Anxiety   . CAD (coronary artery disease)    a. 10/2015 ant STEMI >> LHC with 3 v CAD; oLAD tx with POBA >> emergent CABG.  . Chronic systolic CHF (congestive heart failure) ([place])   . Colitis   . Dyspnea   . Esophageal reflux    eosinophil esophagitis  . Former tobacco use   . Hypertension   . Ischemic cardiomyopathy    a. EF 25-30% at intraop TEE 4/17  //  b. Limited Echo 5/17 - EF 45-50%, mild ant HK  . Sinus bradycardia    a. HR dropping into 40s in 02/2016 -> BB reduced.  . Symptomatic hypotension    a. 02/2016 ER visit -> meds reduced.     Surgical History:  Past Surgical History:  Procedure Laterality Date  . CARDIAC CATHETERIZATION N/A 11/28/2015   Procedure: Left Heart Cath and Coronary Angiography;  Surgeon: [name], MD;  Location: [place] [facility];  Service: Cardiovascular;  Laterality: N/A;  . CARDIAC CATHETERIZATION N/A 11/28/2015   Procedure: Coronary Balloon Angioplasty;  Surgeon: [name], MD;  Location: [place] [facility];  Service: Cardiovascular;  Laterality: N/A;  ostial LAD  . CARDIAC CATHETERIZATION N/A 11/28/2015   Procedure: Coronary/Graft Angiography;  Surgeon: [name], MD;  Location: [place] [facility];  Service: Cardiovascular;  Laterality: N/A;  coronaries only   . CARDIAC CATHETERIZATION N/A 04/21/2016   Procedure: Left Heart Cath and Coronary Angiography;  Surgeon: Wellington Hampshire, MD;  Location: [place] [facility];  Service: Cardiovascular;  Laterality: N/A;  . CARDIAC CATHETERIZATION N/A 06/14/2016   Procedure: Left Heart Cath and Cors/Grafts Angiography;  Surgeon: Lorretta Harp, MD;  Location: [place] [facility];  Service: Cardiovascular;  Laterality: N/A;  . CARDIAC CATHETERIZATION N/A 09/08/2016   Procedure: Left Heart Cath and Cors/Grafts Angiography;  Surgeon: Wellington Hampshire, MD;  Location: [place] [facility];  Service: Cardiovascular;  Laterality: N/A;  . CORONARY ARTERY BYPASS GRAFT N/A 11/28/2015   Procedure: CORONARY ARTERY BYPASS GRAFTING (CABG) TIMES FIVE USING LEFT INTERNAL MAMMARY ARTERY AND RIGHT GREATER SAPHENOUS,VIEN HARVEATED BY ENDOVIEN, INTRAOPPRATIVE TEE;  Surgeon: Gaye Pollack, MD;  Location: [place];  Service: Open Heart Surgery;  Laterality: N/A;  . PERIPHERAL VASCULAR CATHETERIZATION N/A 06/14/2016   Procedure:  Lower Extremity Angiography;  Surgeon: Lorretta Harp, MD;  Location: [place] [facility];  Service: Cardiovascular;  Laterality: N/A;  . SHOULDER SURGERY       Home Meds: Prior to Admission medications   Medication Sig Start Date End Date Taking? Authorizing Provider  aspirin 81 MG tablet Take 81 mg by mouth daily.   Yes [provider]  atorvastatin (LIPITOR) 80 MG tablet Take 1 tablet (80 mg total) by  mouth daily. 11/10/16 02/08/17 Yes Donzetta Starch, NP  chlordiazePOXIDE (LIBRIUM) 10 MG capsule Take 10 mg by mouth daily as needed for anxiety.    Yes [provider]  clopidogrel (PLAVIX) 75 MG tablet Take 75 mg by mouth daily.   Yes [provider]  DM-Doxylamine-Acetaminophen (NYQUIL COLD & FLU PO) Take 1 capsule by mouth at bedtime as needed (cold symptoms).   Yes [provider]  ezetimibe (ZETIA) 10 MG tablet Take 1 tablet (10 mg total) by mouth daily. 08/12/16 01/04/17 Yes [name], MD  fluticasone [facility]) 50 MCG/ACT nasal spray Place 2 sprays into both nostrils daily.   Yes [provider]  lisinopril (PRINIVIL,ZESTRIL) 5 MG tablet Take 1 tablet daily. May take additional tablet once daily for SBP greater than 150 one hour after taking daily dose. 09/01/16  Yes [name], MD  metoprolol tartrate (LOPRESSOR) 25 MG tablet Take 1 tablet (25 mg total) by mouth 2 (two) times daily. Patient taking differently: Take 25 mg by mouth daily.  11/10/16  Yes Donzetta Starch, NP  Multiple Vitamins-Minerals (ONE-A-DAY MENS 50+ ADVANTAGE PO) Take 1 tablet by mouth daily.    Yes [provider]  nitroGLYCERIN (NITROSTAT) 0.4 MG SL tablet Place 0.4 mg under the tongue every 5 (five) minutes as needed for chest pain. 3 DOSE MAX   Yes [provider]  ranitidine (ZANTAC) 150 MG tablet Take 150 mg by mouth daily as needed for heartburn.    Yes [provider]  traZODone (DESYREL) 50 MG tablet Take 50 mg by mouth at bedtime as needed for sleep.  12/08/16  Yes [provider]  aluminum hydroxide-magnesium carbonate (GAVISCON) 95-358 MG/15ML SUSP Take 30 mLs by mouth as needed for indigestion or heartburn.    [provider]    Allergies:  Allergies  Allergen Reactions  . Prednisone Other (See Comments)    States that this med makes him "crazy"  . Tetanus Toxoids Swelling and Other (See Comments)    Fever,  Swelling of the arm   . Wellbutrin [Bupropion] Other (See Comments)    Crazy thoughts, nightmares  . Chantix [Varenicline] Other (See Comments)    Dreams    Social History   Social History  . Marital status: Married    Spouse name: [name]  . Number of children: 3  . Years of education: College   Occupational History  . Self-employed Self-Employed   Social History Main Topics  . Smoking status: Former Smoker    Packs/day: 0.50    Types: Cigarettes    Quit date: 11/28/2015  . Smokeless tobacco: Never Used     Comment: Quit smoking 10/2015 after MI  . Alcohol use No  . Drug use: No  . Sexual activity: Not on file   Other Topics Concern  . Not on file   Social History Narrative   Patient lives at home with his spouse.   Caffeine Use: yes     Family History  Problem Relation Age of Onset  .  Lung cancer Mother   . Heart Problems Father   . Heart attack Father 24  . Stroke Father   . Heart attack Maternal Grandmother   . Stroke Maternal Grandmother   . Heart attack Paternal Uncle   . Hypertension Brother   . Autoimmune disease Neg Hx     Review of Systems: General: positive for fatigue ENT: negative for rhinorrhea or epistaxis Cardiovascular: see HPI Dermatological: negative for rash Respiratory: negative for cough or wheezing GI: negative for nausea, vomiting, diarrhea, bright red blood per rectum, melena, or hematemesis GU: no hematuria, urgency, or frequency Neurologic: negative for visual changes, syncope, or dizziness. Positive for headache. Heme: no easy bruising or bleeding Endo: negative for excessive thirst, thyroid disorder, or flushing Musculoskeletal: negative for joint pain or swelling, negative for myalgias All other systems reviewed and are otherwise negative except as noted above.  Physical Exam: Blood pressure 124/70, pulse (!) 51, resp. rate 14, height '5\' 9"'$  (1.753 m), weight 187 lb (84.8 kg), SpO2 97 %. General: Well developed, well  nourished, alert and oriented, in no acute distress. HEENT: Normocephalic, atraumatic, sclera anicteric Neck: Supple. Carotids 2+ without bruits. JVP normal Lungs: Clear bilaterally to auscultation without wheezes, rales, or rhonchi. Breathing is unlabored. Heart: bradycardic and regular with normal S1 and S2. No murmurs, rubs, or gallops appreciated. Abdomen: Soft, non-tender, non-distended with normoactive bowel sounds. No hepatomegaly. No rebound/guarding. No obvious abdominal masses. Back: No CVA tenderness Msk:  Strength and tone appear normal for age. Extremities: No clubbing, cyanosis, or edema.  Distal pedal pulses are 2+ and equal bilaterally. Neuro: CNII-XII intact, moves all extremities spontaneously. Psych:  Responds to questions appropriately with a normal affect. Skin: warm and dry without rash   Labs:   Lab Results  Component Value Date   WBC 7.7 01/04/2017   HGB 12.7 (L) 01/04/2017   HCT 38.7 (L) 01/04/2017   MCV 91.7 01/04/2017   PLT 177 01/04/2017    Recent Labs Lab 01/04/17 1100  NA 142  K 4.3  CL 107  CO2 27  BUN 16  CREATININE 1.11  CALCIUM 9.2  GLUCOSE 101*   No results for input(s): CKTOTAL, CKMB, TROPONINI in the last 72 hours. Lab Results  Component Value Date   CHOL 108 11/10/2016   HDL 34 (L) 11/10/2016   LDLCALC 49 11/10/2016   TRIG 126 11/10/2016   Lab Results  Component Value Date   DDIMER 0.29 06/10/2016    Radiology/Studies:  Dg Chest 2 View  Result Date: 01/04/2017 CLINICAL DATA:  Midline and LEFT side chest pain with tightness and burning with shortness of breath since this morning, LEFT side neck pain and shoulder pain, hypertension, former smoker EXAM: CHEST  2 VIEW COMPARISON:  11/02/2016 FINDINGS: Normal heart size post CABG. Mediastinal contours and pulmonary vascularity normal. Atherosclerotic calcification aorta. Lungs slightly hyperinflated but clear. No infiltrate, pleural effusion or pneumothorax. Bones demineralized.  IMPRESSION: No acute abnormalities. Post CABG. Aortic atherosclerosis. Electronically Signed   By: Lavonia Dana M.D.   On: 01/04/2017 11:49     EKG: sinus bradycardia 52 bpm, no significant ST-T changes  CARDIAC STUDIES: 2D Echo 11-09-2016: Study Conclusions  - Left ventricle: The cavity size was normal. Wall thickness was   normal. Systolic function was normal. The estimated ejection   fraction was in the range of 55% to 60%. Wall motion was normal;   there were no regional wall motion abnormalities. Doppler   parameters are consistent with abnormal left ventricular  relaxation (grade 1 diastolic dysfunction). - Mitral valve: Calcified annulus.  Impressions:  - Normal LV systolic function; mild diastolic dysfunction; trace MR   and TR  Cardiac Cath 09-08-2016: Conclusion     The left ventricular systolic function is normal.  LV end diastolic pressure is mildly elevated.  The left ventricular ejection fraction is 50-55% by visual estimate.  Ost LAD to Prox LAD lesion, 75 %stenosed.  Ost Ramus lesion, 90 %stenosed.  Ramus lesion, 75 %stenosed.  Ost RCA to Prox RCA lesion, 50 %stenosed.  RPDA lesion, 40 %stenosed.  Origin to Mid Graft lesion, 60 %stenosed.  Origin to Mid Graft lesion, 50 %stenosed.  SVG.  Origin to Prox Graft lesion, 100 %stenosed.  Origin to Prox Graft lesion, 70 %stenosed.  1st Diag lesion, 60 %stenosed.  Ost LM lesion, 60 %stenosed.  Ost Cx lesion, 70 %stenosed.   1. Significant underlying three-vessel coronary artery disease with patent grafts except for SVG to diagonal which is known to be occluded from before. Stable moderate disease in SVG to OM. There is mild progression of disease of proximal SVG to right PDA which might be hemodynamically significant. However, the native right coronary artery has only moderate nonobstructive disease. Thus, no indication for revascularization. There is more antegrade flow going down the native left  coronary system due to what seems to be improvement in left main disease and ostial LAD and left circumflex disease.  2. Low normal LV systolic function with an EF of 50%. Mildly elevated left ventricular end-diastolic pressure.  Recommendations: Minimal change in coronary anatomy since most recent cardiac catheterization. I recommend intensifying antianginal therapy. I added metoprolol.     ASSESSMENT AND PLAN:  1. Chest pain at moderate risk of ACS: initial EKG/troponin unremarkable. Symptoms are fairly typical and seem to be associated with elevated BP (possibly supply/demand mismatch). Will cycle enzymes, admit for observation overnight, and add amlodipine for better BP control. Recent cath and echo studies reviewed. Would continue ASA, plavix, beta-blocker.   2. HTN, uncontrolled: add amlodipine. Continue lisinopril and toprol-XL.  3. Dyslipidemia: continue atorvastatin  Dispo: overnight observation. Follow BP and cycle enzymes. Pt with multiple heart cath studies and stress test within past year will try to avoid another evaluation unless objective evidence of ischemia/infarction.  Deatra James MD 01/04/2017, 5:13 PM

## 2017-01-04 NOTE — ED Notes (Signed)
Cards now at bedside.

## 2017-01-04 NOTE — ED Notes (Signed)
Report attempted, no answer.  

## 2017-01-05 ENCOUNTER — Other Ambulatory Visit: Payer: Self-pay | Admitting: Cardiology

## 2017-01-05 DIAGNOSIS — I252 Old myocardial infarction: Secondary | ICD-10-CM | POA: Diagnosis not present

## 2017-01-05 DIAGNOSIS — R079 Chest pain, unspecified: Secondary | ICD-10-CM

## 2017-01-05 DIAGNOSIS — I110 Hypertensive heart disease with heart failure: Secondary | ICD-10-CM | POA: Diagnosis not present

## 2017-01-05 DIAGNOSIS — I5022 Chronic systolic (congestive) heart failure: Secondary | ICD-10-CM | POA: Diagnosis not present

## 2017-01-05 DIAGNOSIS — R0789 Other chest pain: Secondary | ICD-10-CM | POA: Diagnosis not present

## 2017-01-05 LAB — BASIC METABOLIC PANEL
Anion gap: 7 (ref 5–15)
BUN: 13 mg/dL (ref 6–20)
CO2: 29 mmol/L (ref 22–32)
Calcium: 9.5 mg/dL (ref 8.9–10.3)
Chloride: 104 mmol/L (ref 101–111)
Creatinine, Ser: 1.02 mg/dL (ref 0.61–1.24)
GFR calc Af Amer: >60 mL/min (ref >60)
GFR calc non Af Amer: >60 mL/min (ref >60)
Glucose, Bld: 104 mg/dL — ABNORMAL HIGH (ref 65–99)
Potassium: 3.7 mmol/L (ref 3.5–5.1)
Sodium: 140 mmol/L (ref 135–145)

## 2017-01-05 LAB — LIPID PANEL
Cholesterol: 91 mg/dL (ref 0–200)
HDL: 37 mg/dL — ABNORMAL LOW (ref >40)
LDL Cholesterol: 36 mg/dL (ref 0–99)
Total CHOL/HDL Ratio: 2.5 RATIO
Triglycerides: 90 mg/dL (ref <150)
VLDL: 18 mg/dL (ref 0–40)

## 2017-01-05 LAB — CBC
HCT: 43.2 % (ref 39.0–52.0)
Hemoglobin: 14.1 g/dL (ref 13.0–17.0)
MCH: 30.1 pg (ref 26.0–34.0)
MCHC: 32.6 g/dL (ref 30.0–36.0)
MCV: 92.3 fL (ref 78.0–100.0)
Platelets: 177 10*3/uL (ref 150–400)
RBC: 4.68 MIL/uL (ref 4.22–5.81)
RDW: 13 % (ref 11.5–15.5)
WBC: 7.7 10*3/uL (ref 4.0–10.5)

## 2017-01-05 LAB — TROPONIN I
Troponin I: <0.03 ng/mL (ref <0.03)
Troponin I: <0.03 ng/mL (ref <0.03)

## 2017-01-05 MED ORDER — RANOLAZINE ER 500 MG PO TB12
500.0000 mg | ORAL_TABLET | Freq: Two times a day (BID) | ORAL | Status: DC
  Administered 2017-01-05: 500 mg via ORAL
  Filled 2017-01-05: qty 1

## 2017-01-05 MED ORDER — AMLODIPINE BESYLATE 5 MG PO TABS: 5.0000 mg | ORAL_TABLET | Freq: Every day | ORAL | 6 refills | Status: DC

## 2017-01-05 MED ORDER — RANOLAZINE ER 500 MG PO TB12: 500.0000 mg | ORAL_TABLET | Freq: Two times a day (BID) | ORAL | 6 refills | Status: DC
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
## 2017-01-05 NOTE — Discharge Summary (Signed)
Discharge Summary    Patient ID: [name],  MRN: [id number], DOB/AGE: March 13, 1956 61 y.o.  Admit date: 01/04/2017 Discharge date: 01/05/2017  Primary Care Provider: [name] Primary Cardiologist: Irish Lack  Discharge Diagnoses    Active Problems:   Chest pain at rest   Allergies Allergies  Allergen Reactions  . Prednisone Other (See Comments)    States that this med makes him "crazy"  . Tetanus Toxoids Swelling and Other (See Comments)    Fever, Swelling of the arm   . Wellbutrin [Bupropion] Other (See Comments)    Crazy thoughts, nightmares  . Chantix [Varenicline] Other (See Comments)    Dreams    Diagnostic Studies/Procedures    N/A _____________   History of Present Illness     61 y.o. male w/ PMHx significant for CAD s/p CABG who presented to [facility] on 01/04/2017 with complaints of chest pain.  The patient has developed chronic recurrent chest pain since undergoing CABG one year ago. He has had 3 heart caths in 8/17, 10/17, and 1/18 demonstrating stable moderate multivessel CAD, and patent vein grafts with areas of nonobstructive disease. One diagonal graft is occluded.   The patient had been experiencing elevated BP and episodes of left sided and substernal chest pain. He has had chest discomfort associated with moderate exertion. Symptoms have been worse over the past 3-4 days. Also complained of fatigue and dyspnea. No orthopnea, PND, or palpitations. He had checked his BP during episodes of chest pain and BP has been elevated.   Hospital Course    He was admitted and ruled out with enzymes. Amlodipine '5mg'$  daily was added with improved blood pressures. He continued to c/o chest pain and Ranexa was added to his medication regimen. His Ddimer was negative, and there was low suspicion for PE.   He was seen by Dr. Burt Knack and determined stable for discharge home. Will plan for outpatient exercise treadmill at the office. Follow up in the  office has been arranged. Medications are listed below.  _____________  Discharge Vitals Blood pressure 137/67, pulse (!) 57, temperature 97.4 F (36.3 C), temperature source Oral, resp. rate 18, height '5\' 9"'$  (1.753 m), weight 188 lb (85.3 kg), SpO2 100 %.  Filed Weights   01/04/17 1048 01/04/17 1958 01/05/17 0433  Weight: 187 lb (84.8 kg) 189 lb 3.2 oz (85.8 kg) 188 lb (85.3 kg)    Labs & Radiologic Studies    CBC  Recent Labs  01/04/17 2049 01/05/17 0748  WBC 10.3 7.7  HGB 13.0 14.1  HCT 39.8 43.2  MCV 92.1 92.3  PLT 170 941   Basic Metabolic Panel  Recent Labs  01/04/17 1100 01/04/17 2049 01/05/17 0748  NA 142  --  140  K 4.3  --  3.7  CL 107  --  104  CO2 27  --  29  GLUCOSE 101*  --  104*  BUN 16  --  13  CREATININE 1.11 1.03 1.02  CALCIUM 9.2  --  9.5   Liver Function Tests No results for input(s): AST, ALT, ALKPHOS, BILITOT, PROT, ALBUMIN in the last 72 hours. No results for input(s): LIPASE, AMYLASE in the last 72 hours. Cardiac Enzymes  Recent Labs  01/04/17 2049 01/05/17 0110 01/05/17 0748  TROPONINI <0.03 <0.03 <0.03   BNP Invalid input(s): POCBNP D-Dimer No results for input(s): DDIMER in the last 72 hours. Hemoglobin A1C No results for input(s): HGBA1C in the last 72 hours. Fasting Lipid Panel  Recent Labs  01/05/17 0124  CHOL 91  HDL 37*  LDLCALC 36  TRIG 90  CHOLHDL 2.5   Thyroid Function Tests No results for input(s): TSH, T4TOTAL, T3FREE, THYROIDAB in the last 72 hours.  Invalid input(s): FREET3 _____________  Dg Chest 2 View  Result Date: 01/04/2017 CLINICAL DATA:  Midline and LEFT side chest pain with tightness and burning with shortness of breath since this morning, LEFT side neck pain and shoulder pain, hypertension, former smoker EXAM: CHEST  2 VIEW COMPARISON:  11/02/2016 FINDINGS: Normal heart size post CABG. Mediastinal contours and pulmonary vascularity normal. Atherosclerotic calcification aorta. Lungs slightly  hyperinflated but clear. No infiltrate, pleural effusion or pneumothorax. Bones demineralized. IMPRESSION: No acute abnormalities. Post CABG. Aortic atherosclerosis. Electronically Signed   By: Lavonia Dana M.D.   On: 01/04/2017 11:49   Disposition   Pt is being discharged home today in good condition.  Follow-up Plans & Appointments    Follow-up Information    [place] [facility] Follow up on 01/13/2017.   Specialty:  Cardiology Why:  at 10am for your exercise stress test.  Contact information: [address], [address] [place], [place] Follow up on 01/18/2017.   Specialty:  Cardiology Why:  at 8:30am for your follow up appt.  Contact information: [address] [place] [address] [phone]          Discharge Instructions    Diet - low sodium heart healthy    Complete by:  As directed    Increase activity slowly    Complete by:  As directed       Discharge Medications   Current Discharge Medication List    START taking these medications   Details  amLODipine (NORVASC) 5 MG tablet Take 1 tablet (5 mg total) by mouth daily. Qty: 30 tablet, Refills: 6    ranolazine (RANEXA) 500 MG 12 hr tablet Take 1 tablet (500 mg total) by mouth 2 (two) times daily. Qty: 60 tablet, Refills: 6      CONTINUE these medications which have NOT CHANGED   Details  aspirin 81 MG tablet Take 81 mg by mouth daily.    atorvastatin (LIPITOR) 80 MG tablet Take 1 tablet (80 mg total) by mouth daily. Qty: 30 tablet, Refills: 2    chlordiazePOXIDE (LIBRIUM) 10 MG capsule Take 10 mg by mouth daily as needed for anxiety.     clopidogrel (PLAVIX) 75 MG tablet Take 75 mg by mouth daily.    DM-Doxylamine-Acetaminophen (NYQUIL COLD & FLU PO) Take 1 capsule by mouth at bedtime as needed (cold symptoms).    ezetimibe (ZETIA) 10 MG tablet Take 1 tablet (10 mg total) by mouth daily. Qty: 30 tablet,  Refills: 11    fluticasone (FLONASE) 50 MCG/ACT nasal spray Place 2 sprays into both nostrils daily.    lisinopril (PRINIVIL,ZESTRIL) 5 MG tablet Take 1 tablet daily. May take additional tablet once daily for SBP greater than 150 one hour after taking daily dose. Qty: 135 tablet, Refills: 3    metoprolol tartrate (LOPRESSOR) 25 MG tablet Take 1 tablet (25 mg total) by mouth 2 (two) times daily. Qty: 60 tablet, Refills: 2    Multiple Vitamins-Minerals (ONE-A-DAY MENS 50+ ADVANTAGE PO) Take 1 tablet by mouth daily.     nitroGLYCERIN (NITROSTAT) 0.4 MG SL tablet Place 0.4 mg under the tongue every 5 (five) minutes as needed for chest pain. 3  DOSE MAX    ranitidine (ZANTAC) 150 MG tablet Take 150 mg by mouth daily as needed for heartburn.     traZODone (DESYREL) 50 MG tablet Take 50 mg by mouth at bedtime as needed for sleep.     aluminum hydroxide-magnesium carbonate (GAVISCON) 95-358 MG/15ML SUSP Take 30 mLs by mouth as needed for indigestion or heartburn.          Outstanding Labs/Studies   N/A  Duration of Discharge Encounter   Greater than 30 minutes including physician time.  Signed, Reino Bellis NP-C 01/05/2017, 2:53 PM

## 2017-01-05 NOTE — Progress Notes (Signed)
Progress Note  Patient Name: [name] Date of Encounter: 01/05/2017  Primary Cardiologist: [place] continues to have chest pain, rates a 5/10 this morning.   Inpatient Medications    Scheduled Meds: . amLODipine  5 mg Oral Daily  . aspirin  81 mg Oral Daily  . atorvastatin  80 mg Oral q1800  . clopidogrel  75 mg Oral Daily  . enoxaparin (LOVENOX) injection  40 mg Subcutaneous Q24H  . ezetimibe  10 mg Oral Daily  . famotidine  20 mg Oral Daily  . fluticasone  2 spray Each Nare Daily  . lisinopril  5 mg Oral Daily  . metoprolol tartrate  25 mg Oral BID  . multivitamin with minerals  1 tablet Oral Daily  . ranolazine  500 mg Oral BID   Continuous Infusions:  PRN Meds: acetaminophen, alum & mag hydroxide-simeth, chlordiazePOXIDE, morphine injection, nitroGLYCERIN, ondansetron (ZOFRAN) IV, traZODone   Vital Signs    Vitals:   01/04/17 2245 01/04/17 2335 01/05/17 0433 01/05/17 0500  BP: 119/74  131/65 137/67  Pulse: (!) 59 (!) 47 (!) 57   Resp: 17  18   Temp:   97.4 F (36.3 C)   TempSrc:   Oral   SpO2: 99%  100%   Weight:   188 lb (85.3 kg)   Height:       No intake or output data in the 24 hours ending 01/05/17 1143 Filed Weights   01/04/17 1048 01/04/17 1958 01/05/17 0433  Weight: 187 lb (84.8 kg) 189 lb 3.2 oz (85.8 kg) 188 lb (85.3 kg)    Telemetry    SR with episodes of ST - Personally Reviewed  ECG    SB - Personally Reviewed  Physical Exam   General: Well developed, well nourished, male appearing in no acute distress. Head: Normocephalic, atraumatic.  Neck: Supple without bruits, JVD. Lungs:  Resp regular and unlabored, CTA. Heart: RRR, S1, S2, no S3, S4, or murmur; no rub. Abdomen: Soft, non-tender, non-distended with normoactive bowel sounds. No hepatomegaly. No rebound/guarding. No obvious abdominal masses. Extremities: No clubbing, cyanosis, edema. Distal pedal pulses are 2+ bilaterally. Neuro: Alert and oriented  X 3. Moves all extremities spontaneously. Psych: Normal affect.  Labs    Chemistry Recent Labs Lab 01/04/17 1100 01/04/17 2049 01/05/17 0748  NA 142  --  140  K 4.3  --  3.7  CL 107  --  104  CO2 27  --  29  GLUCOSE 101*  --  104*  BUN 16  --  13  CREATININE 1.11 1.03 1.02  CALCIUM 9.2  --  9.5  GFRNONAA >60 >60 >60  GFRAA >60 >60 >60  ANIONGAP 8  --  7     Hematology Recent Labs Lab 01/04/17 1100 01/04/17 2049 01/05/17 0748  WBC 7.7 10.3 7.7  RBC 4.22 4.32 4.68  HGB 12.7* 13.0 14.1  HCT 38.7* 39.8 43.2  MCV 91.7 92.1 92.3  MCH 30.1 30.1 30.1  MCHC 32.8 32.7 32.6  RDW 12.9 12.9 13.0  PLT 177 170 177    Cardiac Enzymes Recent Labs Lab 01/04/17 2049 01/05/17 0110 01/05/17 0748  TROPONINI <0.03 <0.03 <0.03    Recent Labs Lab 01/04/17 1111  TROPIPOC 0.00     BNP Recent Labs Lab 01/04/17 1106  BNP 110.6*     DDimer No results for input(s): DDIMER in the last 168 hours.    Radiology    Dg Chest 2 View  Result  Date: 01/04/2017 CLINICAL DATA:  Midline and LEFT side chest pain with tightness and burning with shortness of breath since this morning, LEFT side neck pain and shoulder pain, hypertension, former smoker EXAM: CHEST  2 VIEW COMPARISON:  11/02/2016 FINDINGS: Normal heart size post CABG. Mediastinal contours and pulmonary vascularity normal. Atherosclerotic calcification aorta. Lungs slightly hyperinflated but clear. No infiltrate, pleural effusion or pneumothorax. Bones demineralized. IMPRESSION: No acute abnormalities. Post CABG. Aortic atherosclerosis. Electronically Signed   By: Lavonia Dana M.D.   On: 01/04/2017 11:49    Cardiac Studies   N/A  Patient Profile     61 y.o. male with PMH of CAD s/p CABG who presented with chest pain.   Assessment & Plan    1. Chest pain: EKG remains unchanged. Trop neg x3. He has been receiving IV morphine for ongoing chest pain, still reports a 5/10 this morning. Was tried on Imdur within the past few  months, but had neurological symptoms? Therefore was stopped. Long discussion with him this morning. Will add Ranexa '500mg'$  BID.   2. HTN: Well controlled. Amlodipine was added yesterday  3. HL: On statin  Signed, Reino Bellis, NP  01/05/2017, 11:43 AM    Patient seen, examined. Available data reviewed. Agree with findings, assessment, and plan as outlined by Reino Bellis, NP. The patient is alert and oriented in no distress. Lungs are clear. There is no chest wall tenderness. Heart is regular rate and rhythm no murmur or gallop. Abdomen is soft and nontender, extremities are without edema, skin is warm and dry without rash.  Serial EKGs are reviewed and they demonstrate no ischemic changes. Troponins are negative. The patient continues to have intermittent chest pain which seems to be highly atypical. We have started him on amlodipine to add onto his antianginal therapy. His blood pressure is now well controlled. Will also add Ranexa 500 mg twice daily.  The patient's d-dimer is negative. There is very low suspicion for pulmonary embolus or ischemic chest pain based on negative objective findings as outlined above. I think the patient should be discharged home today. Will arrange an outpatient exercise treadmill (non-imaging stress test) and an follow-up visit in approximately 1-2 weeks.  [name], M.D. 01/05/2017 1:21 PM

## 2017-01-06 ENCOUNTER — Ambulatory Visit (INDEPENDENT_AMBULATORY_CARE_PROVIDER_SITE_OTHER): Payer: BC Managed Care – PPO | Admitting: Psychology

## 2017-01-06 ENCOUNTER — Encounter: Payer: Self-pay | Admitting: Psychology

## 2017-01-06 DIAGNOSIS — F419 Anxiety disorder, unspecified: Secondary | ICD-10-CM

## 2017-01-06 DIAGNOSIS — R413 Other amnesia: Secondary | ICD-10-CM

## 2017-01-06 NOTE — Patient Instructions (Addendum)
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 Results of cognitive testing were largely within normal limits but he did demonstrate probable mild deficits on a test of verbal memory and on a test of visual-spatial construction. His difficulties on the verbal memory test were not indicative of retrieval or consolidation deficits but instead suggested executive dysfunction with difficulty with initial organizing/encoding of incoming information (which is dependent on frontal subcortical networks). He also demonstrated some difficulty with verbal fluency tasks but it was not consistently impaired. Overall, his testing results are not indicative of a dementia syndrome but do suggest possible mild cognitive impairment. Given that initial onset of cognitive complaints was following MI, it is possible he may have suffered a mild hypoxic brain injury. Fortunately his recent MRI of the brain was normal. I suspect that the patient's mild cognitive impairment is greatly exacerbated by his chronic anxiety which heightened after his MI. I am hopeful that better management of his anxiety may improve cognitive functioning in daily life. The patient also asked about the impact of concussion history on current cognitive functioning, and I explained to him that a history of mild concussions with complete resolution of symptoms would not cause later occurence of cognitive symptoms.   How STRESS and ANXIETY affect your ability to pay attention  . In times of stress, it is common to have difficulty concentrating, paying attention and making decisions . Various anxiety disorders also interfere with attention and concentration . Problems with attention and concentration can disrupt the process of learning and making new memories, which can make it seem like there is a problem with your memory. In your daily life, you may experience this disruption as forgetting names and  appointments, misplacing items, and needing to make lists for shopping and errands  . Regardless of the test scores on neuropsychological evaluation, psychosocial stress can interfere with the ability to make use of your cognitive resources and function optimally across settings such as work or school, maintaining the home and responsibilities, and personal relationships  . Assessment and monitoring of symptoms o A thorough psychological evaluation is important in order to assess current symptoms and determine the best treatment o The assessment provides a starting point to monitor symptom improvement  . Treatment o Psychotherapy: Individual and group psychotherapy have been shown to be effective in improving the symptoms of depression and anxiety disorders, and typically cognitive difficulties improve with symptom reduction o Mindfulness based interventions have been shown to be effective in improving stress management o Behavioral strategies for improving attention and concentration in your daily life may be helpful. (See information below.)  . Pleasurable activities, exercise, daily structure o People who have high stress levels often stop engaging in activities that they enjoy, which can have an additional negative effect on their mood. It is recommended that all patients set aside time in their schedule to engage in pleasurable activities because this has been shown to help alleviate many types of symptoms. Examples of pleasurable activities include taking a walk/spending time outdoors, reading or seeing a movie, spending time with friends and/or family, listening to music, and volunteering o In addition to the numerous physical health benefits of exercise, there are many psychological and cognitive benefits as well.  o Maintaining a regular schedule is also recommended, such as setting regular sleep/wake times and scheduling activities such as social outings     Strategies to enhance  cognitive functioning Attention, concentration, memory encoding and consolidation    .  Make a plan and be prepared o If you find that you are more attentive at certain times of the day (i.e., the morning), plan important activities and discussions at that time o Determine which activities take the most time and which are most important, then prioritize your "to do list" based on this information o Break tasks into simpler parts, understand the steps involved before starting o Rehearse the steps mentally or write them down. If you write them down, you can use this as a checklist to check off as you complete them. o Visualize completing the task  . Use external aids  o Write everything down that you do not need to know or work with right now. Don't store extra information in your brain that you don't need right now.  o Use a calendar or planner to make checklists, due dates and "to do" lists. o Use ONLY ONE calendar or planner and look at it frequently o Set alarms for important deadlines or appointments  . Minimize interruptions and distractions  o Find a good work environment, e.g., quiet room with a desk, close curtains, use earplugs, mask sounds with a fan or white noise machine o Turn off cell phone and/or email alerts during important tasks. In fact, it is helpful to schedule a block of time each day where you limit phone and email interruptions and focus on just the more detailed work you have. o Try to minimize the amount of background noise (i.e., television, music) when engaged in important tasks or conversations with others (note that some individuals find soft background music helpful in minimize distraction, so you may need to experiment with optimal level of noise for specific situations)  . Use active effort = consciously attending to details, closely analyzing o Failures of encoding may reflect failure to attend to one's own actions o Be prepared to work more slowly than you  usually do  o When reading, allow time for re-reading sections  o Check your work for errors  . Avoid multitasking o Do not attempt to complete more than one task at one time. Focus on one task until it is completed and then move on to the next one. o Avoid other activities while engaged in important tasks, such as talking on the phone while balancing the checkbook, making a shopping list during a meeting.   . Use self-talk during tasks o Repeat the steps of the activity to yourself as you complete them o Talk to yourself about your progress o This helps you maintain focus on the task and makes it easier to remember completing the task (Similar to "active effort" above)  . Conserve energy o Conserve energy to avoid fatigue and its effects on cognition - Get enough sleep - Pace yourself  and make sure to take breaks - Be open to receiving help - Exercise for increased energy  . Conversational vigilance = paying attention during a conversation  o Listen actively: focus on the speaker and position yourself so that you can clearly hear the him/her, and have open/relaxed posture  o Eye contact: Maintaining eye contact with the person you are speaking with may increase the likelihood that important information is properly received  o Ask questions: Ask questions for clarification (e.g., request that the speaker explain something in a different way) or ask for information to be repeated if you become distracted, or if you do not hear or understand something during a conversation  o Paraphrase: Summarize or repeat back important  information from a conversation in your own words to facilitate communication and ensure that you have heard correctly and understand    A referral to [facility] was made for counseling (cognitive behavioral therapy for anxiety). They will call you to give you more information and schedule initial appointment.

## 2017-01-13 ENCOUNTER — Ambulatory Visit (INDEPENDENT_AMBULATORY_CARE_PROVIDER_SITE_OTHER): Payer: BC Managed Care – PPO

## 2017-01-13 ENCOUNTER — Telehealth: Payer: Self-pay | Admitting: *Deleted

## 2017-01-13 DIAGNOSIS — R079 Chest pain, unspecified: Secondary | ICD-10-CM | POA: Diagnosis not present

## 2017-01-13 LAB — EXERCISE TOLERANCE TEST
Estimated workload: 7.3 METS
Exercise duration (min): 6 min
Exercise duration (sec): 15 s
MPHR: 159 {beats}/min
Peak HR: 136 {beats}/min
Percent HR: 85 %
RPE: 16
Rest HR: 71 {beats}/min

## 2017-01-13 NOTE — Telephone Encounter (Signed)
-----   Message from [name], NP sent at 01/13/2017  3:30 PM EDT ----- Please let Mr. Offerdahl know that his stress test was normal. Thanks

## 2017-01-14 NOTE — Telephone Encounter (Signed)
Patient informed. 

## 2017-01-15 NOTE — Progress Notes (Signed)
Cardiology Office Note    Date:  01/18/2017   ID:  [name], DOB July 21, 1956, MRN [id number]  PCP:  [name], MD  Cardiologist:  Dr. Irish Lack   Chief Complaint: Hospital follow up for chest pain   History of Present Illness:   [name] is a 61 y.o. male for CAD s/p CABG, ICM, chronic systolic CHF, HTN, HLD and prior tobacco smoker presents for hospital follow up.  Hx of chronic recurrent chest pain since undergoing CABG 10/2015. He has had 3 heart caths in 8/17, 10/17, and 1/18 demonstrating stable moderate multivessel CAD, and patent vein grafts with areas of nonobstructive disease. One diagonal graft is occluded.   Came to ER 01/04/17 with elevated BP and episodes of left sided and substernal chest pain. He has had chest discomfort associated with moderate exertion. Symptoms have been worse over the past 3-4 days with associated fatigue and dyspnea. He ruled out. Amlodipine '5mg'$  daily was added with improved blood pressures. He continued to c/o chest pain and Ranexa was added to his medication regimen. His d-dimer was negative, and there was low suspicion for PE.    Outpatient exercise treadmill test  01/13/17 showed on ischemia. Normal BP response  Here today for follow up. He had a 1 episode of chest pain that resolved with sublingual nitroglycerin x 1. He has been ambulating without any dyspnea or chest pain. Denies orthopnea, PND, syncope, extremity edema, melena, or in his stool or urine or palpitation. Compliant with medication and low-sodium diet.    Past Medical History:  Diagnosis Date  . Acute hepatitis C without mention of hepatic coma(070.51)    Tx by [facility] for Hep.  negative for hep c 2/15.  Marland Kitchen Anxiety   . CAD (coronary artery disease)    a. 10/2015 ant STEMI >> LHC with 3 v CAD; oLAD tx with POBA >> emergent CABG.  . Chronic systolic CHF (congestive heart failure) ([place])   . Colitis   . Dyspnea   . Esophageal reflux    eosinophil esophagitis  . Former  tobacco use   . Hypertension   . Ischemic cardiomyopathy    a. EF 25-30% at intraop TEE 4/17  //  b. Limited Echo 5/17 - EF 45-50%, mild ant HK  . Sinus bradycardia    a. HR dropping into 40s in 02/2016 -> BB reduced.  . Symptomatic hypotension    a. 02/2016 ER visit -> meds reduced.    Past Surgical History:  Procedure Laterality Date  . CARDIAC CATHETERIZATION N/A 11/28/2015   Procedure: Left Heart Cath and Coronary Angiography;  Surgeon: [name], MD;  Location: [place] [facility];  Service: Cardiovascular;  Laterality: N/A;  . CARDIAC CATHETERIZATION N/A 11/28/2015   Procedure: Coronary Balloon Angioplasty;  Surgeon: [name], MD;  Location: [place] [facility];  Service: Cardiovascular;  Laterality: N/A;  ostial LAD  . CARDIAC CATHETERIZATION N/A 11/28/2015   Procedure: Coronary/Graft Angiography;  Surgeon: [name], MD;  Location: [place] [facility];  Service: Cardiovascular;  Laterality: N/A;  coronaries only   . CARDIAC CATHETERIZATION N/A 04/21/2016   Procedure: Left Heart Cath and Coronary Angiography;  Surgeon: Wellington Hampshire, MD;  Location: [place] [facility];  Service: Cardiovascular;  Laterality: N/A;  . CARDIAC CATHETERIZATION N/A 06/14/2016   Procedure: Left Heart Cath and Cors/Grafts Angiography;  Surgeon: Lorretta Harp, MD;  Location: [facility];  Service: Cardiovascular;  Laterality: N/A;  . CARDIAC CATHETERIZATION N/A  09/08/2016   Procedure: Left Heart Cath and Cors/Grafts Angiography;  Surgeon: Wellington Hampshire, MD;  Location: [place] [facility];  Service: Cardiovascular;  Laterality: N/A;  . CORONARY ARTERY BYPASS GRAFT N/A 11/28/2015   Procedure: CORONARY ARTERY BYPASS GRAFTING (CABG) TIMES FIVE USING LEFT INTERNAL MAMMARY ARTERY AND RIGHT GREATER SAPHENOUS,VIEN HARVEATED BY ENDOVIEN, INTRAOPPRATIVE TEE;  Surgeon: Gaye Pollack, MD;  Location: [facility];  Service: Open Heart Surgery;  Laterality: N/A;  . PERIPHERAL VASCULAR  CATHETERIZATION N/A 06/14/2016   Procedure: Lower Extremity Angiography;  Surgeon: Lorretta Harp, MD;  Location: [place] [facility];  Service: Cardiovascular;  Laterality: N/A;  . SHOULDER SURGERY      Current Medications: Prior to Admission medications   Medication Sig Start Date End Date Taking? Authorizing Provider  aluminum hydroxide-magnesium carbonate (GAVISCON) 95-358 MG/15ML SUSP Take 30 mLs by mouth as needed for indigestion or heartburn.    [provider]  amLODipine (NORVASC) 5 MG tablet Take 1 tablet (5 mg total) by mouth daily. 01/06/17   [name], NP  aspirin 81 MG tablet Take 81 mg by mouth daily.    [provider]  atorvastatin (LIPITOR) 80 MG tablet Take 1 tablet (80 mg total) by mouth daily. 11/10/16 02/08/17  Donzetta Starch, NP  chlordiazePOXIDE (LIBRIUM) 10 MG capsule Take 10 mg by mouth daily as needed for anxiety.     [provider]  clopidogrel (PLAVIX) 75 MG tablet Take 75 mg by mouth daily.    [provider]  DM-Doxylamine-Acetaminophen (NYQUIL COLD & FLU PO) Take 1 capsule by mouth at bedtime as needed (cold symptoms).    [provider]  ezetimibe (ZETIA) 10 MG tablet Take 1 tablet (10 mg total) by mouth daily. 08/12/16 01/04/17  [name], MD  fluticasone [facility]) 50 MCG/ACT nasal spray Place 2 sprays into both nostrils daily.    [provider]  lisinopril (PRINIVIL,ZESTRIL) 5 MG tablet Take 1 tablet daily. May take additional tablet once daily for SBP greater than 150 one hour after taking daily dose. 09/01/16   [name], MD  metoprolol tartrate (LOPRESSOR) 25 MG tablet Take 1 tablet (25 mg total) by mouth 2 (two) times daily. Patient taking differently: Take 25 mg by mouth daily.  11/10/16   Donzetta Starch, NP  Multiple Vitamins-Minerals (ONE-A-DAY MENS 50+ ADVANTAGE PO) Take 1 tablet by mouth daily.     [provider]  nitroGLYCERIN (NITROSTAT) 0.4 MG SL tablet Place  0.4 mg under the tongue every 5 (five) minutes as needed for chest pain. 3 DOSE MAX    [provider]  ranitidine (ZANTAC) 150 MG tablet Take 150 mg by mouth daily as needed for heartburn.     [provider]  ranolazine (RANEXA) 500 MG 12 hr tablet Take 1 tablet (500 mg total) by mouth 2 (two) times daily. 01/05/17   [name], NP  traZODone (DESYREL) 50 MG tablet Take 50 mg by mouth at bedtime as needed for sleep.  12/08/16   [provider]    Allergies:   Prednisone; Tetanus toxoids; Wellbutrin [bupropion]; and Chantix [varenicline]   Social History   Social History  . Marital status: Married    Spouse name: [name]  . Number of children: 3  . Years of education: College   Occupational History  . Self-employed Self-Employed   Social History Main Topics  . Smoking status: Former Smoker    Packs/day: 0.50    Types:  Cigarettes    Quit date: 11/28/2015  . Smokeless tobacco: Never Used     Comment: Quit smoking 10/2015 after MI  . Alcohol use No  . Drug use: No  . Sexual activity: Not Asked   Other Topics Concern  . None   Social History Narrative   Patient lives at home with his spouse.   Caffeine Use: yes     Family History:  The patient's family history includes Heart Problems in his father; Heart attack in his maternal grandmother and paternal uncle; Heart attack (age of onset: 62) in his father; Hypertension in his brother; Lung cancer in his mother; Stroke in his father and maternal grandmother.   ROS:   Please see the history of present illness.    ROS All other systems reviewed and are negative.   PHYSICAL EXAM:   VS:  BP 110/66   Pulse 74   Ht '5\' 9"'$  (1.753 m)   Wt 191 lb 12.8 oz (87 kg)   SpO2 98%   BMI 28.32 kg/m    GEN: Well nourished, well developed, in no acute distress  HEENT: normal  Neck: no JVD, carotid bruits, or masses Cardiac: RRR; no murmurs, rubs, or gallops,no edema  Respiratory:  clear to auscultation  bilaterally, normal work of breathing GI: soft, nontender, nondistended, + BS MS: no deformity or atrophy  Skin: warm and dry, no rash Neuro:  Alert and Oriented x 3, Strength and sensation are intact Psych: euthymic mood, full affect  Wt Readings from Last 3 Encounters:  01/18/17 191 lb 12.8 oz (87 kg)  01/05/17 188 lb (85.3 kg)  12/28/16 192 lb (87.1 kg)      Studies/Labs Reviewed:   EKG:  EKG is not ordered today.    Recent Labs: 06/14/2016: Magnesium 2.2 11/09/2016: ALT 15 01/04/2017: B Natriuretic Peptide 110.6 01/05/2017: BUN 13; Creatinine, Ser 1.02; Hemoglobin 14.1; Platelets 177; Potassium 3.7; Sodium 140   Lipid Panel    Component Value Date/Time   CHOL 91 01/05/2017 0124   CHOL 130 09/30/2016 0939   TRIG 90 01/05/2017 0124   HDL 37 (L) 01/05/2017 0124   HDL 55 09/30/2016 0939   CHOLHDL 2.5 01/05/2017 0124   VLDL 18 01/05/2017 0124   LDLCALC 36 01/05/2017 0124   LDLCALC 54 09/30/2016 0939    Additional studies/ records that were reviewed today include:   As above   ASSESSMENT & PLAN:   1. CAD s/p CABG 10/2015 - follow up cath after CABG due to ongoing chest pain  8/17, 10/17, and 1/18 demonstrating stable moderate multivessel CAD, and patent vein grafts with areas of nonobstructive disease. One diagonal graft is occluded.  - Added amlodipine and Renexa during admission. His symptoms could be associated with elevated BP.  - Normal ETT. No angina. Continue current therapy.  2. HTN - stable and well controlled with current medications.   3. HLD - 01/05/2017: Cholesterol 91; HDL 37; LDL Cholesterol 36; Triglycerides 90; VLDL 18  - Continue statin     Medication Adjustments/Labs and Tests Ordered: Current medicines are reviewed at length with the patient today.  Concerns regarding medicines are outlined above.  Medication changes, Labs and Tests ordered today are listed in the Patient Instructions below. Patient Instructions  Medication Instructions:  Your  physician recommends that you continue on your current medications as directed. Please refer to the Current Medication list given to you today.   Labwork: None ordered  Testing/Procedures:   Follow-Up: Your physician recommends that you  schedule a follow-up appointment in:    Any Other Special Instructions Will Be Listed Below (If Applicable).     If you need a refill on your cardiac medications before your next appointment, please call your pharmacy.      Jarrett Soho, [place]  01/18/2017 8:57 AM    [facility] [address], [place], [place]  [address] Phone: [phone]; Fax: [phone]

## 2017-01-18 ENCOUNTER — Ambulatory Visit (INDEPENDENT_AMBULATORY_CARE_PROVIDER_SITE_OTHER): Payer: BC Managed Care – PPO | Admitting: Physician Assistant

## 2017-01-18 ENCOUNTER — Encounter: Payer: Self-pay | Admitting: Physician Assistant

## 2017-01-18 VITALS — BP 110/66 | HR 74 | Ht 69.0 in | Wt 191.8 lb

## 2017-01-18 DIAGNOSIS — I25118 Atherosclerotic heart disease of native coronary artery with other forms of angina pectoris: Secondary | ICD-10-CM | POA: Diagnosis not present

## 2017-01-18 DIAGNOSIS — E785 Hyperlipidemia, unspecified: Secondary | ICD-10-CM | POA: Diagnosis not present

## 2017-01-18 DIAGNOSIS — I10 Essential (primary) hypertension: Secondary | ICD-10-CM

## 2017-01-18 DIAGNOSIS — R079 Chest pain, unspecified: Secondary | ICD-10-CM

## 2017-01-18 NOTE — Patient Instructions (Addendum)
Medication Instructions:  Your physician recommends that you continue on your current medications as directed. Please refer to the Current Medication list given to you today.   Labwork: None ordered  Testing/Procedures: None ordered  Follow-Up: Your physician recommends that you schedule a follow-up appointment in: 3 MONTHS WITH DR. VARANASI   Any Other Special Instructions Will Be Listed Below (If Applicable).     If you need a refill on your cardiac medications before your next appointment, please call your pharmacy.

## 2017-01-25 ENCOUNTER — Other Ambulatory Visit: Payer: Self-pay | Admitting: Physician Assistant

## 2017-01-25 DIAGNOSIS — I2510 Atherosclerotic heart disease of native coronary artery without angina pectoris: Secondary | ICD-10-CM

## 2017-01-25 DIAGNOSIS — I255 Ischemic cardiomyopathy: Secondary | ICD-10-CM

## 2017-02-15 ENCOUNTER — Ambulatory Visit (INDEPENDENT_AMBULATORY_CARE_PROVIDER_SITE_OTHER): Payer: BC Managed Care – PPO | Admitting: Licensed Clinical Social Worker

## 2017-02-15 DIAGNOSIS — F3341 Major depressive disorder, recurrent, in partial remission: Secondary | ICD-10-CM | POA: Diagnosis not present

## 2017-02-17 ENCOUNTER — Telehealth: Payer: Self-pay | Admitting: Interventional Cardiology

## 2017-02-17 NOTE — Telephone Encounter (Signed)
Patient called and states that this morning he was taking his dogs out and he had an episode of left sided chest pain and was nauseous. The patient states that he thought that it may be related to his indigestion but he had already taken a zantac without relief. He states that he took 1 NTG and his pain was relieved. He states that his BP was 115/75. Patient denies having any SOB, lightheadedness, dizziness, sweating, or any other symptoms. Patient states that he is taking his medications as directed and that this is the first episode that he has had since starting the amlodipine and ranexa. Patient denies having any symptoms at this time. Normal stress test 01/13/17. Information forwarded to Dr. Irish Lack for review. ER precautions reviewed with the patient. Patient verbalized understanding.

## 2017-02-17 NOTE — Telephone Encounter (Signed)
New message    Pt is calling asking for a call back.   Pt c/o of Chest Pain: STAT if CP now or developed within 24 hours  1. Are you having CP right now? No   2. Are you experiencing any other symptoms (ex. SOB, nausea, vomiting, sweating)? nausea  3. How long have you been experiencing CP? This morning   4. Is your CP continuous or coming and going? Coming and going  5. Have you taken Nitroglycerin? Yes-this morning ?

## 2017-02-18 NOTE — Telephone Encounter (Signed)
Continue to monitor.  He has had an extensive w/u since his CABG.

## 2017-02-21 ENCOUNTER — Other Ambulatory Visit: Payer: Self-pay

## 2017-02-21 NOTE — Patient Outreach (Signed)
Telephone outreach to patient to obtain mRs was successfully completed. mRs= 0. 

## 2017-02-22 ENCOUNTER — Ambulatory Visit (INDEPENDENT_AMBULATORY_CARE_PROVIDER_SITE_OTHER): Payer: BC Managed Care – PPO | Admitting: Licensed Clinical Social Worker

## 2017-02-22 DIAGNOSIS — F3341 Major depressive disorder, recurrent, in partial remission: Secondary | ICD-10-CM | POA: Diagnosis not present

## 2017-02-22 NOTE — Telephone Encounter (Signed)
Patient made aware of Dr. Hassell Done recommendations. Patient verbalized understanding.

## 2017-03-01 ENCOUNTER — Ambulatory Visit: Payer: BC Managed Care – PPO | Admitting: Licensed Clinical Social Worker

## 2017-03-10 ENCOUNTER — Observation Stay (HOSPITAL_COMMUNITY)
Admission: EM | Admit: 2017-03-10 | Discharge: 2017-03-13 | Disposition: A | Discharge status: Home / Self Care | Payer: BC Managed Care – PPO | Attending: Cardiovascular Disease | Admitting: Cardiovascular Disease

## 2017-03-10 ENCOUNTER — Encounter (HOSPITAL_COMMUNITY): Payer: Self-pay | Admitting: *Deleted

## 2017-03-10 ENCOUNTER — Other Ambulatory Visit: Payer: Self-pay

## 2017-03-10 ENCOUNTER — Emergency Department (HOSPITAL_COMMUNITY): Payer: BC Managed Care – PPO

## 2017-03-10 DIAGNOSIS — I252 Old myocardial infarction: Secondary | ICD-10-CM | POA: Diagnosis not present

## 2017-03-10 DIAGNOSIS — I5022 Chronic systolic (congestive) heart failure: Secondary | ICD-10-CM | POA: Diagnosis not present

## 2017-03-10 DIAGNOSIS — Z79899 Other long term (current) drug therapy: Secondary | ICD-10-CM | POA: Diagnosis not present

## 2017-03-10 DIAGNOSIS — Z951 Presence of aortocoronary bypass graft: Secondary | ICD-10-CM

## 2017-03-10 DIAGNOSIS — Z888 Allergy status to other drugs, medicaments and biological substances status: Secondary | ICD-10-CM | POA: Diagnosis not present

## 2017-03-10 DIAGNOSIS — E785 Hyperlipidemia, unspecified: Secondary | ICD-10-CM | POA: Diagnosis present

## 2017-03-10 DIAGNOSIS — R079 Chest pain, unspecified: Secondary | ICD-10-CM | POA: Diagnosis present

## 2017-03-10 DIAGNOSIS — I25110 Atherosclerotic heart disease of native coronary artery with unstable angina pectoris: Secondary | ICD-10-CM | POA: Diagnosis not present

## 2017-03-10 DIAGNOSIS — I110 Hypertensive heart disease with heart failure: Secondary | ICD-10-CM | POA: Diagnosis not present

## 2017-03-10 DIAGNOSIS — F3189 Other bipolar disorder: Secondary | ICD-10-CM | POA: Insufficient documentation

## 2017-03-10 DIAGNOSIS — I25700 Atherosclerosis of coronary artery bypass graft(s), unspecified, with unstable angina pectoris: Secondary | ICD-10-CM | POA: Insufficient documentation

## 2017-03-10 DIAGNOSIS — F419 Anxiety disorder, unspecified: Secondary | ICD-10-CM | POA: Insufficient documentation

## 2017-03-10 DIAGNOSIS — Z7982 Long term (current) use of aspirin: Secondary | ICD-10-CM | POA: Insufficient documentation

## 2017-03-10 DIAGNOSIS — R001 Bradycardia, unspecified: Secondary | ICD-10-CM | POA: Insufficient documentation

## 2017-03-10 DIAGNOSIS — Z887 Allergy status to serum and vaccine status: Secondary | ICD-10-CM | POA: Diagnosis not present

## 2017-03-10 DIAGNOSIS — Z7902 Long term (current) use of antithrombotics/antiplatelets: Secondary | ICD-10-CM | POA: Insufficient documentation

## 2017-03-10 DIAGNOSIS — Z8619 Personal history of other infectious and parasitic diseases: Secondary | ICD-10-CM | POA: Insufficient documentation

## 2017-03-10 DIAGNOSIS — I200 Unstable angina: Secondary | ICD-10-CM | POA: Diagnosis present

## 2017-03-10 DIAGNOSIS — Z87891 Personal history of nicotine dependence: Secondary | ICD-10-CM | POA: Insufficient documentation

## 2017-03-10 DIAGNOSIS — I255 Ischemic cardiomyopathy: Secondary | ICD-10-CM | POA: Diagnosis not present

## 2017-03-10 DIAGNOSIS — R1011 Right upper quadrant pain: Secondary | ICD-10-CM | POA: Diagnosis not present

## 2017-03-10 DIAGNOSIS — M797 Fibromyalgia: Secondary | ICD-10-CM | POA: Insufficient documentation

## 2017-03-10 DIAGNOSIS — K219 Gastro-esophageal reflux disease without esophagitis: Secondary | ICD-10-CM | POA: Insufficient documentation

## 2017-03-10 DIAGNOSIS — I25810 Atherosclerosis of coronary artery bypass graft(s) without angina pectoris: Secondary | ICD-10-CM | POA: Diagnosis present

## 2017-03-10 DIAGNOSIS — I25119 Atherosclerotic heart disease of native coronary artery with unspecified angina pectoris: Secondary | ICD-10-CM | POA: Diagnosis present

## 2017-03-10 DIAGNOSIS — E782 Mixed hyperlipidemia: Secondary | ICD-10-CM

## 2017-03-10 DIAGNOSIS — I10 Essential (primary) hypertension: Secondary | ICD-10-CM | POA: Diagnosis present

## 2017-03-10 LAB — COMPREHENSIVE METABOLIC PANEL
ALT: 27 U/L (ref 17–63)
AST: 20 U/L (ref 15–41)
Albumin: 4.1 g/dL (ref 3.5–5.0)
Alkaline Phosphatase: 69 U/L (ref 38–126)
Anion gap: 6 (ref 5–15)
BUN: 21 mg/dL — ABNORMAL HIGH (ref 6–20)
CO2: 27 mmol/L (ref 22–32)
Calcium: 9.6 mg/dL (ref 8.9–10.3)
Chloride: 104 mmol/L (ref 101–111)
Creatinine, Ser: 1.1 mg/dL (ref 0.61–1.24)
GFR calc Af Amer: >60 mL/min (ref >60)
GFR calc non Af Amer: >60 mL/min (ref >60)
Glucose, Bld: 105 mg/dL — ABNORMAL HIGH (ref 65–99)
Potassium: 4.6 mmol/L (ref 3.5–5.1)
Sodium: 137 mmol/L (ref 135–145)
Total Bilirubin: 0.4 mg/dL (ref 0.3–1.2)
Total Protein: 6.4 g/dL — ABNORMAL LOW (ref 6.5–8.1)

## 2017-03-10 LAB — CBC
HCT: 44.3 % (ref 39.0–52.0)
Hemoglobin: 14.6 g/dL (ref 13.0–17.0)
MCH: 30.2 pg (ref 26.0–34.0)
MCHC: 33 g/dL (ref 30.0–36.0)
MCV: 91.7 fL (ref 78.0–100.0)
Platelets: 180 10*3/uL (ref 150–400)
RBC: 4.83 MIL/uL (ref 4.22–5.81)
RDW: 13.3 % (ref 11.5–15.5)
WBC: 8.3 10*3/uL (ref 4.0–10.5)

## 2017-03-10 LAB — CBC WITH DIFFERENTIAL/PLATELET
Basophils Absolute: 0 10*3/uL (ref 0.0–0.1)
Basophils Relative: 0 %
Eosinophils Absolute: 0.3 10*3/uL (ref 0.0–0.7)
Eosinophils Relative: 3 %
HCT: 42.1 % (ref 39.0–52.0)
Hemoglobin: 13.6 g/dL (ref 13.0–17.0)
Lymphocytes Relative: 28 %
Lymphs Abs: 2.7 10*3/uL (ref 0.7–4.0)
MCH: 29.7 pg (ref 26.0–34.0)
MCHC: 32.3 g/dL (ref 30.0–36.0)
MCV: 91.9 fL (ref 78.0–100.0)
Monocytes Absolute: 0.6 10*3/uL (ref 0.1–1.0)
Monocytes Relative: 6 %
Neutro Abs: 6 10*3/uL (ref 1.7–7.7)
Neutrophils Relative %: 63 %
Platelets: 185 10*3/uL (ref 150–400)
RBC: 4.58 MIL/uL (ref 4.22–5.81)
RDW: 13.4 % (ref 11.5–15.5)
WBC: 9.6 10*3/uL (ref 4.0–10.5)

## 2017-03-10 LAB — CREATININE, SERUM
Creatinine, Ser: 1.01 mg/dL (ref 0.61–1.24)
GFR calc Af Amer: >60 mL/min (ref >60)
GFR calc non Af Amer: >60 mL/min (ref >60)

## 2017-03-10 LAB — D-DIMER, QUANTITATIVE: D-Dimer, Quant: <0.27 ug/mL-FEU (ref 0.00–0.50)

## 2017-03-10 LAB — TROPONIN I
Troponin I: <0.03 ng/mL (ref <0.03)
Troponin I: <0.03 ng/mL (ref <0.03)
Troponin I: <0.03 ng/mL (ref <0.03)

## 2017-03-10 LAB — LIPASE, BLOOD: Lipase: 24 U/L (ref 11–51)

## 2017-03-10 MED ORDER — MORPHINE SULFATE (PF) 4 MG/ML IV SOLN
4.0000 mg | Freq: Once | INTRAVENOUS | Status: AC
  Administered 2017-03-10: 4 mg via INTRAVENOUS
  Filled 2017-03-10: qty 1

## 2017-03-10 MED ORDER — HEPARIN SODIUM (PORCINE) 5000 UNIT/ML IJ SOLN
5000.0000 [IU] | Freq: Three times a day (TID) | INTRAMUSCULAR | Status: DC
  Administered 2017-03-10 – 2017-03-13 (×6): 5000 [IU] via SUBCUTANEOUS
  Filled 2017-03-10 (×7): qty 1

## 2017-03-10 MED ORDER — ATORVASTATIN CALCIUM 80 MG PO TABS
80.0000 mg | ORAL_TABLET | Freq: Every day | ORAL | Status: DC
  Administered 2017-03-10 – 2017-03-13 (×4): 80 mg via ORAL
  Filled 2017-03-10 (×5): qty 1

## 2017-03-10 MED ORDER — ONDANSETRON HCL 4 MG/2ML IJ SOLN
4.0000 mg | Freq: Four times a day (QID) | INTRAMUSCULAR | Status: DC | PRN
  Administered 2017-03-11: 4 mg via INTRAVENOUS
  Filled 2017-03-10: qty 2

## 2017-03-10 MED ORDER — CLOPIDOGREL BISULFATE 75 MG PO TABS
75.0000 mg | ORAL_TABLET | Freq: Every day | ORAL | Status: DC
  Administered 2017-03-10 – 2017-03-13 (×4): 75 mg via ORAL
  Filled 2017-03-10 (×4): qty 1

## 2017-03-10 MED ORDER — ASPIRIN EC 81 MG PO TBEC
81.0000 mg | DELAYED_RELEASE_TABLET | Freq: Every day | ORAL | Status: DC
  Administered 2017-03-10 – 2017-03-12 (×3): 81 mg via ORAL
  Filled 2017-03-10 (×3): qty 1

## 2017-03-10 MED ORDER — RANOLAZINE ER 500 MG PO TB12
1000.0000 mg | ORAL_TABLET | Freq: Two times a day (BID) | ORAL | Status: DC
  Administered 2017-03-10 – 2017-03-13 (×7): 1000 mg via ORAL
  Filled 2017-03-10 (×10): qty 2

## 2017-03-10 MED ORDER — NITROGLYCERIN 0.4 MG SL SUBL
0.4000 mg | SUBLINGUAL_TABLET | SUBLINGUAL | Status: DC | PRN
  Administered 2017-03-11: 0.4 mg via SUBLINGUAL
  Filled 2017-03-10 (×2): qty 1

## 2017-03-10 MED ORDER — NITROGLYCERIN 2 % TD OINT
1.0000 [in_us] | TOPICAL_OINTMENT | Freq: Four times a day (QID) | TRANSDERMAL | Status: DC
  Administered 2017-03-10: 1 [in_us] via TOPICAL
  Filled 2017-03-10: qty 1

## 2017-03-10 MED ORDER — SODIUM CHLORIDE 0.9 % IV BOLUS (SEPSIS)
1000.0000 mL | Freq: Once | INTRAVENOUS | Status: AC
  Administered 2017-03-10: 1000 mL via INTRAVENOUS

## 2017-03-10 MED ORDER — EZETIMIBE 10 MG PO TABS
10.0000 mg | ORAL_TABLET | Freq: Every day | ORAL | Status: DC
  Administered 2017-03-10 – 2017-03-13 (×4): 10 mg via ORAL
  Filled 2017-03-10 (×5): qty 1

## 2017-03-10 MED ORDER — MORPHINE SULFATE (PF) 2 MG/ML IV SOLN
2.0000 mg | Freq: Once | INTRAVENOUS | Status: AC
  Administered 2017-03-10: 2 mg via INTRAVENOUS
  Filled 2017-03-10: qty 1

## 2017-03-10 MED ORDER — TRAZODONE HCL 50 MG PO TABS
50.0000 mg | ORAL_TABLET | Freq: Every evening | ORAL | Status: DC | PRN
  Filled 2017-03-10: qty 1

## 2017-03-10 MED ORDER — METOPROLOL TARTRATE 25 MG PO TABS
25.0000 mg | ORAL_TABLET | Freq: Every day | ORAL | Status: DC
  Administered 2017-03-10 – 2017-03-13 (×4): 25 mg via ORAL
  Filled 2017-03-10 (×4): qty 1

## 2017-03-10 MED ORDER — AMLODIPINE BESYLATE 5 MG PO TABS
5.0000 mg | ORAL_TABLET | Freq: Every day | ORAL | Status: DC
  Administered 2017-03-10 – 2017-03-12 (×3): 5 mg via ORAL
  Filled 2017-03-10 (×3): qty 1

## 2017-03-10 MED ORDER — FENTANYL CITRATE (PF) 100 MCG/2ML IJ SOLN
100.0000 ug | Freq: Once | INTRAMUSCULAR | Status: AC
  Administered 2017-03-10: 100 ug via INTRAVENOUS
  Filled 2017-03-10: qty 2

## 2017-03-10 MED ORDER — CHLORDIAZEPOXIDE HCL 5 MG PO CAPS
10.0000 mg | ORAL_CAPSULE | Freq: Every day | ORAL | Status: DC | PRN
  Administered 2017-03-12 – 2017-03-13 (×2): 10 mg via ORAL
  Filled 2017-03-10 (×2): qty 2

## 2017-03-10 MED ORDER — ACETAMINOPHEN 325 MG PO TABS
650.0000 mg | ORAL_TABLET | ORAL | Status: DC | PRN
  Administered 2017-03-10 – 2017-03-13 (×2): 650 mg via ORAL
  Filled 2017-03-10 (×2): qty 2

## 2017-03-10 MED ORDER — ONDANSETRON HCL 4 MG/2ML IJ SOLN
4.0000 mg | Freq: Once | INTRAMUSCULAR | Status: AC
  Administered 2017-03-10: 4 mg via INTRAVENOUS
  Filled 2017-03-10: qty 2

## 2017-03-10 NOTE — ED Provider Notes (Signed)
[facility] DEPT Provider Note   CSN: [id number] Arrival date & time: 03/10/17  4627   By signing my name below, I, Evelene Croon, attest that this documentation has been prepared under the direction and in the presence of Sherwood Gambler, MD . Electronically Signed: Evelene Croon, Scribe. 03/10/2017. 3:37 AM.   History   Chief Complaint Chief Complaint  Patient presents with  . Chest Pain    The history is provided by the patient. No language interpreter was used.    HPI Comments:  [name] is a 61 y.o. male with a history of CAD, MI, HTN, and CABG, who presents to the Emergency Department complaining of burning central CP that began today ~0000. Pt notes his pain radiates into his upper abdomen and is exacerbated with dep inspiration.  He has a h/o angina, states pain today is similar. He has taken 2 nitro which usually provides complete relief but only provided mild temporary relief. He reports associated nausea and vomiting. He also reports waking up today drenched in sweat. He has taken 324mg  ASA PTA.   Past Medical History:  Diagnosis Date  . Acute hepatitis C without mention of hepatic coma(070.51)    Tx by [facility] for Hep.  negative for hep c 2/15.  Marland Kitchen Anxiety   . CAD (coronary artery disease)    a. 10/2015 ant STEMI >> LHC with 3 v CAD; oLAD tx with POBA >> emergent CABG.  . Chronic systolic CHF (congestive heart failure) ([place])   . Colitis   . Dyspnea   . Esophageal reflux    eosinophil esophagitis  . Former tobacco use   . Hypertension   . Ischemic cardiomyopathy    a. EF 25-30% at intraop TEE 4/17  //  b. Limited Echo 5/17 - EF 45-50%, mild ant HK  . Sinus bradycardia    a. HR dropping into 40s in 02/2016 -> BB reduced.  . Symptomatic hypotension    a. 02/2016 ER visit -> meds reduced.    Patient Active Problem List   Diagnosis Date Noted  . Chest pain 03/10/2017  . Family hx-stroke 11/10/2016  . 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  Unstable angina ([place]) 09/07/2016  . Claudication of both lower extremities ([place])   . Pure hypercholesterolemia   . Tobacco abuse disorder   . Coronary artery disease involving coronary bypass graft of native heart with unstable angina pectoris ([place])   . Chest pain at rest 06/10/2016  . Abnormal nuclear stress test - HIGH RISK 04/20/2016  . Old MI (myocardial infarction)   . Essential hypertension 02/26/2016  . Ischemic cardiomyopathy 12/25/2015  . Dyslipidemia, goal LDL below 70 12/25/2015  . STEMI (ST elevation myocardial infarction) ([place]) 11/28/2015  . 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 Coronary artery disease involving native coronary artery 11/28/2015  . 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 Acute MI anterior wall first episode care [facility])   . Chest pain with high risk for cardiac etiology 03/07/2015  . Dysphagia 03/07/2015  . Gout attack 03/07/2015  . Mixed bipolar I disorder ([place]) 03/07/2015  . Fibromyalgia 07/09/2014  . Gout 07/09/2014  . Anxiety 07/09/2014  . Depression 07/09/2014  . Hepatitis C 11/20/2012  . Eosinophilic esophagitis 03/50/0938    Past Surgical History:  Procedure Laterality Date  . CARDIAC CATHETERIZATION N/A 11/28/2015   Procedure: Left Heart Cath and Coronary Angiography;  Surgeon: [name], MD;  Location: [facility];  Service:  Cardiovascular;  Laterality: N/A;  . CARDIAC CATHETERIZATION N/A 11/28/2015   Procedure: Coronary Balloon Angioplasty;  Surgeon: [name], MD;  Location: [facility];  Service: Cardiovascular;  Laterality: N/A;  ostial LAD  . CARDIAC CATHETERIZATION N/A 11/28/2015   Procedure: Coronary/Graft Angiography;  Surgeon: [name], MD;  Location: [facility];  Service: Cardiovascular;  Laterality: N/A;  coronaries only   . CARDIAC CATHETERIZATION N/A 04/21/2016   Procedure: Left Heart Cath and Coronary Angiography;  Surgeon: Wellington Hampshire, MD;  Location: [facility];   Service: Cardiovascular;  Laterality: N/A;  . CARDIAC CATHETERIZATION N/A 06/14/2016   Procedure: Left Heart Cath and Cors/Grafts Angiography;  Surgeon: Lorretta Harp, MD;  Location: [facility];  Service: Cardiovascular;  Laterality: N/A;  . CARDIAC CATHETERIZATION N/A 09/08/2016   Procedure: Left Heart Cath and Cors/Grafts Angiography;  Surgeon: Wellington Hampshire, MD;  Location: [facility];  Service: Cardiovascular;  Laterality: N/A;  . CORONARY ARTERY BYPASS GRAFT N/A 11/28/2015   Procedure: CORONARY ARTERY BYPASS GRAFTING (CABG) TIMES FIVE USING LEFT INTERNAL MAMMARY ARTERY AND RIGHT GREATER SAPHENOUS,VIEN HARVEATED BY ENDOVIEN, INTRAOPPRATIVE TEE;  Surgeon: Gaye Pollack, MD;  Location: [facility];  Service: Open Heart Surgery;  Laterality: N/A;  . PERIPHERAL VASCULAR CATHETERIZATION N/A 06/14/2016   Procedure: Lower Extremity Angiography;  Surgeon: Lorretta Harp, MD;  Location: [facility];  Service: Cardiovascular;  Laterality: N/A;  . SHOULDER SURGERY         Home Medications    Prior to Admission medications   Medication Sig Start Date End Date Taking? Authorizing Provider  amLODipine (NORVASC) 5 MG tablet Take 1 tablet (5 mg total) by mouth daily. 01/06/17  Yes Reino Bellis B, NP  aspirin 81 MG tablet Take 81 mg by mouth daily.   Yes [provider]  atorvastatin (LIPITOR) 80 MG tablet Take 1 tablet (80 mg total) by mouth daily. 11/10/16 03/10/17 Yes Donzetta Starch, NP  chlordiazePOXIDE (LIBRIUM) 10 MG capsule Take 10 mg by mouth daily as needed for anxiety.    Yes [provider]  clopidogrel (PLAVIX) 75 MG tablet take 1 tablet by mouth once daily 01/25/17  Yes Weaver, Nicki Reaper T, PA-C  ezetimibe (ZETIA) 10 MG tablet Take 1 tablet (10 mg total) by mouth daily. 08/12/16 03/10/17 Yes [name], MD  lisinopril (PRINIVIL,ZESTRIL) 5 MG tablet Take 5 mg by mouth daily. May take 1 additional tablet once daily for SBP greater than 150 once  hour after taking daily dose   Yes [provider]  metoprolol tartrate (LOPRESSOR) 25 MG tablet Take 25 mg by mouth daily.   Yes [provider]  Multiple Vitamins-Minerals (ONE-A-DAY MENS 50+ ADVANTAGE PO) Take 1 tablet by mouth daily.    Yes [provider]  nitroGLYCERIN (NITROSTAT) 0.4 MG SL tablet Place 0.4 mg under the tongue every 5 (five) minutes as needed for chest pain. 3 DOSE MAX   Yes [provider]  ranitidine (ZANTAC) 150 MG tablet Take 150 mg by mouth daily as needed for heartburn.    Yes [provider]  ranolazine (RANEXA) 500 MG 12 hr tablet Take 1 tablet (500 mg total) by mouth 2 (two) times daily. 01/05/17  Yes [name], NP  traZODone (DESYREL) 50 MG tablet Take 50 mg by mouth at bedtime as needed for sleep.  12/08/16  Yes [provider]    Family History Family History  Problem  Relation Age of Onset  . Lung cancer Mother   . Heart Problems Father   . Heart attack Father 66  . Stroke Father   . Heart attack Maternal Grandmother   . Stroke Maternal Grandmother   . Heart attack Paternal Uncle   . Hypertension Brother   . Autoimmune disease Neg Hx     Social History Social History  Substance Use Topics  . Smoking status: Former Smoker    Packs/day: 0.50    Types: Cigarettes    Quit date: 11/28/2015  . Smokeless tobacco: Never Used     Comment: Quit smoking 10/2015 after MI  . Alcohol use No     Allergies   Prednisone; Tetanus toxoids; Wellbutrin [bupropion]; and Chantix [varenicline]   Review of Systems Review of Systems  Constitutional: Positive for diaphoresis.  Cardiovascular: Positive for chest pain.  Gastrointestinal: Positive for nausea and vomiting.  All other systems reviewed and are negative.    Physical Exam Updated Vital Signs BP 117/76 (BP Location: Right Arm)   Pulse 67   Temp 98 F (36.7 C) (Oral)   Resp 16   Ht 5\' 9"  (1.753 m)   Wt 83.9 kg (185 lb)   SpO2 100%   BMI  27.32 kg/m   Physical Exam  Constitutional: He is oriented to person, place, and time. He appears well-developed and well-nourished.  HENT:  Head: Normocephalic and atraumatic.  Right Ear: External ear normal.  Left Ear: External ear normal.  Nose: Nose normal.  Eyes: Right eye exhibits no discharge. Left eye exhibits no discharge.  Neck: Neck supple.  Cardiovascular: Normal rate, regular rhythm and normal heart sounds.   Pulmonary/Chest: Effort normal and breath sounds normal. He exhibits no tenderness.  Abdominal: Soft. There is tenderness (mild RUQ).  Musculoskeletal: He exhibits no edema.  Neurological: He is alert and oriented to person, place, and time.  Skin: Skin is warm and dry.  Nursing note and vitals reviewed.    ED Treatments / Results  DIAGNOSTIC STUDIES:  Oxygen Saturation is 96% on RA, normal by my interpretation.    COORDINATION OF CARE:  3:44 AM Discussed treatment plan with pt at bedside and pt agreed to plan.  Labs (all labs ordered are listed, but only abnormal results are displayed) Labs Reviewed  COMPREHENSIVE METABOLIC PANEL - Abnormal; Notable for the following:       Result Value   Glucose, Bld 105 (*)    BUN 21 (*)    Total Protein 6.4 (*)    All other components within normal limits  LIPASE, BLOOD  TROPONIN I  CBC WITH DIFFERENTIAL/PLATELET  D-DIMER, QUANTITATIVE (NOT AT [facility])    EKG  EKG Interpretation  Date/Time:  Thursday March 10 2017 03:23:50 EDT Ventricular Rate:  68 PR Interval:  152 QRS Duration: 88 QT Interval:  398 QTC Calculation: 423 R Axis:   84 Text Interpretation:  Normal sinus rhythm with sinus arrhythmia Low voltage QRS Borderline ECG no significant change compared to May 2018 Confirmed by Sherwood Gambler [phone]) on 03/10/2017 3:45:58 AM       Radiology Dg Chest 2 View  Result Date: 03/10/2017 CLINICAL DATA:  61 year old male with chest and abdominal pain. EXAM: CHEST  2 VIEW COMPARISON:  Chest radiograph dated  01/04/2017 FINDINGS: The lungs are clear. There is no pleural effusion or pneumothorax. The cardiac silhouette is within normal limits. Median sternotomy wires and CABG vascular clips noted. No acute osseous pathology. IMPRESSION: No active cardiopulmonary disease. Electronically Signed  By: Anner [place] M.D.   On: 03/10/2017 04:23   US Abdomen Limited Ruq  Result Date: 03/10/2017 CLINICAL DATA:  61 year old male with nausea vomiting. EXAM: ULTRASOUND ABDOMEN LIMITED RIGHT UPPER QUADRANT COMPARISON:  None. FINDINGS: Gallbladder: No gallstones or wall thickening visualized. No sonographic Murphy sign noted by sonographer. Common bile duct: Diameter: 3 mm Liver: Unremarkable as visualized. The main portal vein is patent with hepatopetal flow. IMPRESSION: Unremarkable right upper quadrant ultrasound. Electronically Signed   By: Anner [place] M.D.   On: 03/10/2017 05:12    Procedures Procedures (including critical care time)  Medications Ordered in ED Medications  nitroGLYCERIN (NITROSTAT) SL tablet 0.4 mg (not administered)  acetaminophen (TYLENOL) tablet 650 mg (not administered)  ondansetron (ZOFRAN) injection 4 mg (not administered)  nitroGLYCERIN (NITROGLYN) 2 % ointment 1 inch (1 inch Topical Given 03/10/17 0858)  sodium chloride 0.9 % bolus 1,000 mL (0 mLs Intravenous Stopped 03/10/17 0816)  ondansetron (ZOFRAN) injection 4 mg (4 mg Intravenous Given 03/10/17 0535)  fentaNYL (SUBLIMAZE) injection 100 mcg (100 mcg Intravenous Given 03/10/17 0535)  morphine 4 MG/ML injection 4 mg (4 mg Intravenous Given 03/10/17 0659)     Initial Impression / Assessment and Plan / ED Course  I have reviewed the triage vital signs and the nursing notes.  Pertinent labs & imaging results that were available during my care of the patient were reviewed by me and considered in my medical decision making (see chart for details).     Workup unremarkable, no signs of cholecystitis or PE (low risk with  negative PE). Unable to give nitro at this time due to low BP (379 systolic). IV narcotics, already had ASA, admit to cards.  Final Clinical Impressions(s) / ED Diagnoses   Final diagnoses:  Chest pain    New Prescriptions New Prescriptions   No medications on file   I personally performed the services described in this documentation, which was scribed in my presence. The recorded information has been reviewed and is accurate.     Sherwood Gambler, MD 03/10/17 [phone]

## 2017-03-10 NOTE — ED Notes (Signed)
Attempted report x1. 

## 2017-03-10 NOTE — H&P (Addendum)
H&P     Patient ID: [name] MRN: [id number], DOB/AGE: Jan 23, 1956   Admit date: 03/10/2017 Date of Consult: 03/10/2017  Primary Physician: [name], MD Primary Cardiologist: Irish Lack Requesting Provider: Regenia Skeeter Reason for Consultation: Chest pain  [name] is a 61 y.o. male who is being seen today for the evaluation of chest pain at the request of Dr. Regenia Skeeter.  Patient Profile    61 year old male with past medical history of CAD s/p CABG, hypertension, hyperlipidemia, GERD and former tobacco use who presented to the ED with ongoing chest pain.  Past Medical History   Past Medical History:  Diagnosis Date  . Acute hepatitis C without mention of hepatic coma(070.51)    Tx by [facility] for Hep.  negative for hep c 2/15.  Marland Kitchen Anxiety   . CAD (coronary artery disease)    a. 10/2015 ant STEMI >> LHC with 3 v CAD; oLAD tx with POBA >> emergent CABG.  . Chronic systolic CHF (congestive heart failure) ([place])   . Colitis   . Dyspnea   . Esophageal reflux    eosinophil esophagitis  . Former tobacco use   . Hypertension   . Ischemic cardiomyopathy    a. EF 25-30% at intraop TEE 4/17  //  b. Limited Echo 5/17 - EF 45-50%, mild ant HK  . Sinus bradycardia    a. HR dropping into 40s in 02/2016 -> BB reduced.  . Symptomatic hypotension    a. 02/2016 ER visit -> meds reduced.    Past Surgical History:  Procedure Laterality Date  . CARDIAC CATHETERIZATION N/A 11/28/2015   Procedure: Left Heart Cath and Coronary Angiography;  Surgeon: [name], MD;  Location: [place] [facility];  Service: Cardiovascular;  Laterality: N/A;  . CARDIAC CATHETERIZATION N/A 11/28/2015   Procedure: Coronary Balloon Angioplasty;  Surgeon: [name], MD;  Location: [place] [facility];  Service: Cardiovascular;  Laterality: N/A;  ostial LAD  . CARDIAC CATHETERIZATION N/A 11/28/2015   Procedure: Coronary/Graft Angiography;  Surgeon: [name], MD;  Location: [place] [facility];  Service: Cardiovascular;  Laterality: N/A;  coronaries only   . CARDIAC CATHETERIZATION N/A 04/21/2016   Procedure: Left Heart Cath and Coronary Angiography;  Surgeon: Wellington Hampshire, MD;  Location: [place] [facility];  Service: Cardiovascular;  Laterality: N/A;  . CARDIAC CATHETERIZATION N/A 06/14/2016   Procedure: Left Heart Cath and Cors/Grafts Angiography;  Surgeon: Lorretta Harp, MD;  Location: [place] [facility];  Service: Cardiovascular;  Laterality: N/A;  . CARDIAC CATHETERIZATION N/A 09/08/2016   Procedure: Left Heart Cath and Cors/Grafts Angiography;  Surgeon: Wellington Hampshire, MD;  Location: [place] [facility];  Service: Cardiovascular;  Laterality: N/A;  . CORONARY ARTERY BYPASS GRAFT N/A 11/28/2015   Procedure: CORONARY ARTERY BYPASS GRAFTING (CABG) TIMES FIVE USING LEFT INTERNAL MAMMARY ARTERY AND RIGHT GREATER SAPHENOUS,VIEN HARVEATED BY ENDOVIEN, INTRAOPPRATIVE TEE;  Surgeon: Gaye Pollack, MD;  Location: [place];  Service: Open Heart Surgery;  Laterality: N/A;  . PERIPHERAL VASCULAR CATHETERIZATION N/A 06/14/2016   Procedure: Lower Extremity Angiography;  Surgeon: Lorretta Harp, MD;  Location: [place] [facility];  Service: Cardiovascular;  Laterality: N/A;  . SHOULDER SURGERY       Allergies  Allergies  Allergen Reactions  . Prednisone Other (See Comments)    States that this med makes him "crazy"  . Tetanus Toxoids Swelling and Other (See Comments)    Fever, Swelling of the arm   .  Wellbutrin [Bupropion] Other (See Comments)    Crazy thoughts, nightmares  . Chantix [Varenicline] Other (See Comments)    Dreams    History of Present Illness    [name] is a 61 year old male with past medical history of  CAD s/p CABG, hypertension, hyperlipidemia, GERD and former tobacco use. He has presented multiple times with chronic recurrent chest pain since undergoing CABG over a year ago. He has undergone 3 cardiac caths since that time in 8/17, 10/17 and 1/18  demonstrating moderate stable multivessel CAD with patent vein grafts with areas of nonobstructive disease. Does have 1 diagonal graft that is occluded. He last presented to the ED on 01/04/17 with recurrent chest pain and elevated blood pressure. He was admitted ruled out with enzymes. Added amlodipine 5 mg daily and improved his blood pressures. Continue to complain of chest pain throughout admission and Ranexa was added to his medication regimen. He was discharged and underwent outpatient exercise treadmill that was negative for ischemia. Reports since that time he has been doing well, has returned to work full time. Does report intermittent episodes of chest pain, uses sublingual nitroglycerin every couple of weeks for these episodes which usually resolved. 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 Reports developing centralized chest pain around midnight with radiation into the upper abdomen that was worse with deep inspiration. Reports taking 5 sublingual nitroglycerin without any relief. Attempted to take an antacid without relief. Unable to sleep last night. Also reports diaphoresis, along with nausea and vomiting x1.  In the ED his labs showed stable electrolytes, negative troponin I 1, hemoglobin 13.6. EKG showed sinus rhythm without acute ST/T-wave abnormalities. Chest x-ray was negative. Underwent abdominal ultrasound that was negative. Given IV fentanyl with little relief, and IV morphine. States this produces pain from a 7/10 to 3/10.   Inpatient Medications    .  morphine injection  4 mg Intravenous Once    Family History    Family History  Problem Relation Age of Onset  . Lung cancer Mother   . Heart Problems Father   . Heart attack Father 68  . Stroke Father   . Heart attack Maternal Grandmother   . Stroke Maternal Grandmother   . Heart attack Paternal Uncle   . Hypertension Brother   . Autoimmune disease Neg Hx     Social History     Social History   Social History  . Marital status: Married    Spouse name: [name]  . Number of children: 3  . Years of education: College   Occupational History  . Self-employed Self-Employed   Social History Main Topics  . Smoking status: Former Smoker    Packs/day: 0.50    Types: Cigarettes    Quit date: 11/28/2015  . Smokeless tobacco: Never Used     Comment: Quit smoking 10/2015 after MI  . Alcohol use No  . Drug use: No  . Sexual activity: Not on file   Other Topics Concern  . Not on file   Social History Narrative   Patient lives at home with his spouse.   Caffeine Use: yes     Review of Systems    See history of present illness All other systems reviewed and are otherwise negative except as noted above.  Physical Exam    Blood pressure 108/71, pulse 61, temperature 98 F (36.7 C), temperature source Oral, resp. rate 15, SpO2 99 %.  General: Pleasant,  NAD Psych: Normal affect. Neuro: Alert and oriented X 3. Moves all extremities spontaneously. HEENT: Normal  Neck: Supple without bruits or JVD. Lungs:  Resp regular and unlabored, CTA. Heart: RRR no s3, s4, or murmurs. Abdomen: Soft, non-tender, non-distended, BS + x 4.  Extremities: No clubbing, cyanosis or edema. DP/PT/Radials 2+ and equal bilaterally.  Labs    Troponin (Point of Care Test) No results for input(s): TROPIPOC in the last 72 hours.  Recent Labs  03/10/17 0400  TROPONINI <0.03   Lab Results  Component Value Date   WBC 9.6 03/10/2017   HGB 13.6 03/10/2017   HCT 42.1 03/10/2017   MCV 91.9 03/10/2017   PLT 185 03/10/2017    Recent Labs Lab 03/10/17 0400  NA 137  K 4.6  CL 104  CO2 27  BUN 21*  CREATININE 1.10  CALCIUM 9.6  PROT 6.4*  BILITOT 0.4  ALKPHOS 69  ALT 27  AST 20  GLUCOSE 105*   Lab Results  Component Value Date   CHOL 91 01/05/2017   HDL 37 (L) 01/05/2017   LDLCALC 36 01/05/2017   TRIG 90 01/05/2017   Lab Results  Component Value Date   DDIMER  <0.27 03/10/2017     Radiology Studies    Dg Chest 2 View  Result Date: 03/10/2017 CLINICAL DATA:  61 year old male with chest and abdominal pain. EXAM: CHEST  2 VIEW COMPARISON:  Chest radiograph dated 01/04/2017 FINDINGS: The lungs are clear. There is no pleural effusion or pneumothorax. The cardiac silhouette is within normal limits. Median sternotomy wires and CABG vascular clips noted. No acute osseous pathology. IMPRESSION: No active cardiopulmonary disease. Electronically Signed   By: Anner Crete M.D.   On: 03/10/2017 04:23   US Abdomen Limited Ruq  Result Date: 03/10/2017 CLINICAL DATA:  61 year old male with nausea vomiting. EXAM: ULTRASOUND ABDOMEN LIMITED RIGHT UPPER QUADRANT COMPARISON:  None. FINDINGS: Gallbladder: No gallstones or wall thickening visualized. No sonographic Murphy sign noted by sonographer. Common bile duct: Diameter: 3 mm Liver: Unremarkable as visualized. The main portal vein is patent with hepatopetal flow. IMPRESSION: Unremarkable right upper quadrant ultrasound. Electronically Signed   By: Anner Crete M.D.   On: 03/10/2017 05:12    ECG & Cardiac Imaging    EKG: NSR   Echo: 11/09/16  Study Conclusions  - Left ventricle: The cavity size was normal. Wall thickness was   normal. Systolic function was normal. The estimated ejection   fraction was in the range of 55% to 60%. Wall motion was normal;   there were no regional wall motion abnormalities. Doppler   parameters are consistent with abnormal left ventricular   relaxation (grade 1 diastolic dysfunction). - Mitral valve: Calcified annulus.  Impressions:  - Normal LV systolic function; mild diastolic dysfunction; trace MR   and TR.  Cath: 09/08/16  Conclusion     The left ventricular systolic function is normal.  LV end diastolic pressure is mildly elevated.  The left ventricular ejection fraction is 50-55% by visual estimate.  Ost LAD to Prox LAD lesion, 75 %stenosed.  Ost  Ramus lesion, 90 %stenosed.  Ramus lesion, 75 %stenosed.  Ost RCA to Prox RCA lesion, 50 %stenosed.  RPDA lesion, 40 %stenosed.  Origin to Mid Graft lesion, 60 %stenosed.  Origin to Mid Graft lesion, 50 %stenosed.  SVG.  Origin to Prox Graft lesion, 100 %stenosed.  Origin to Prox Graft lesion, 70 %stenosed.  1st Diag lesion, 60 %stenosed.  Ost LM lesion, 60 %stenosed.  Ost Cx lesion, 70 %stenosed.   1. Significant underlying three-vessel coronary artery disease with patent grafts except for SVG to diagonal which is known to be occluded from before. Stable moderate disease in SVG to OM. There is mild progression of disease of proximal SVG to right PDA which might be hemodynamically significant. However, the native right coronary artery has only moderate nonobstructive disease. Thus, no indication for revascularization. There is more antegrade flow going down the native left coronary system due to what seems to be improvement in left main disease and ostial LAD and left circumflex disease.  2. Low normal LV systolic function with an EF of 50%. Mildly elevated left ventricular end-diastolic pressure.  Recommendations: Minimal change in coronary anatomy since most recent cardiac catheterization. I recommend intensifying antianginal therapy. I added metoprolol.    Assessment & Plan    61 year old male with past medical history of CAD s/p CABG, hypertension, hyperlipidemia, GERD and former tobacco use who presented to the ED with ongoing chest pain.  1. Chest pain: Known history of chronic chest pain with multiple cardiac caths and stress tests over the past year moderate stable three-vessel disease with 3/4 patent grafts. Reports she is actually been feeling significantly better since his last admission in 5/17. Presented again with recurrent episode of chest pain with nausea and vomiting. No relief with sublingual nitroglycerin. Troponin negative 1, EKG nonacute. ABD [name] negative.    -- cycle enzmes -- will increase ranexa to 1000mg  BID -- discuss with MD regarding further work up  2. HTN: Well controlled with current therapy  3. HL: on statin  Weston Brass Reino Bellis, NP-C Pager [phone] 03/10/2017, 6:56 AM   Attending Note:   The patient was seen and examined.  Agree with assessment and plan as noted above.  Changes made to the above note as needed.  Patient seen and independently examined with Reino Bellis, NP .   We discussed all aspects of the encounter. I agree with the assessment and plan as stated above.  1. CHest pain :  Similar to his angina  Slight pleuretic component Not relived with SL NTG or antiacids I have personally reviewed the previous caths  ECG is unchanged.  Troponins are negative.   Will admit for obs. Cycle enzymes D-dimer Will review his cath with Dr. Irish Lack.   He may need another cath.  Increase Ranexa to 100 mg BID,   HR is 59-60 - not much room to go up on metoprolol. He has tried imdur - did not tolerate it very well.  May try a different dosing schedule with a higher dose.    2. Hyperlipidemia:   LDL is very well controlled on Atorvastatin and Zetia .    I have spent a total of 40 minutes with patient reviewing hospital  notes , telemetry, EKGs, labs and examining patient as well as establishing an assessment and plan that was discussed with the patient. > 50% of time was spent in direct patient care.    Thayer Headings, Brooke Bonito., MD, [facility] 03/10/2017, 8:20 AM [address]  [place] Pager [phone]

## 2017-03-10 NOTE — ED Triage Notes (Signed)
Patient presents to ED via [facility] states he woke up approx. 12 mn states he was very sweaty  With epigastric pain , states he took antiacid  And ntg. . States he took 5 ntg at home with relief however it would only last 5-10 minutes then would return. States the pain is worse with inspiration. C/o dry cough. C/o nausea and vomited x 1 . Patient was given 1 ntg per ems. Currently rates his pain at 7-8/10

## 2017-03-11 ENCOUNTER — Ambulatory Visit: Payer: BC Managed Care – PPO | Admitting: Licensed Clinical Social Worker

## 2017-03-11 ENCOUNTER — Ambulatory Visit (HOSPITAL_COMMUNITY)
Admission: EM | Disposition: A | Discharge status: Home / Self Care | Payer: Self-pay | Source: Home / Self Care | Attending: Emergency Medicine

## 2017-03-11 ENCOUNTER — Encounter (HOSPITAL_COMMUNITY): Payer: Self-pay | Admitting: Cardiology

## 2017-03-11 DIAGNOSIS — I10 Essential (primary) hypertension: Secondary | ICD-10-CM

## 2017-03-11 DIAGNOSIS — E785 Hyperlipidemia, unspecified: Secondary | ICD-10-CM | POA: Diagnosis not present

## 2017-03-11 DIAGNOSIS — I25110 Atherosclerotic heart disease of native coronary artery with unstable angina pectoris: Secondary | ICD-10-CM | POA: Diagnosis not present

## 2017-03-11 DIAGNOSIS — I252 Old myocardial infarction: Secondary | ICD-10-CM | POA: Diagnosis not present

## 2017-03-11 DIAGNOSIS — I25700 Atherosclerosis of coronary artery bypass graft(s), unspecified, with unstable angina pectoris: Secondary | ICD-10-CM | POA: Diagnosis not present

## 2017-03-11 HISTORY — PX: LEFT HEART CATH AND CORS/GRAFTS ANGIOGRAPHY: CATH118250

## 2017-03-11 LAB — BASIC METABOLIC PANEL
Anion gap: 5 (ref 5–15)
BUN: 14 mg/dL (ref 6–20)
CO2: 28 mmol/L (ref 22–32)
Calcium: 9 mg/dL (ref 8.9–10.3)
Chloride: 104 mmol/L (ref 101–111)
Creatinine, Ser: 1.2 mg/dL (ref 0.61–1.24)
GFR calc Af Amer: >60 mL/min (ref >60)
GFR calc non Af Amer: >60 mL/min (ref >60)
Glucose, Bld: 100 mg/dL — ABNORMAL HIGH (ref 65–99)
Potassium: 4.5 mmol/L (ref 3.5–5.1)
Sodium: 137 mmol/L (ref 135–145)

## 2017-03-11 LAB — PROTIME-INR
INR: 0.97
Prothrombin Time: 12.8 seconds (ref 11.4–15.2)

## 2017-03-11 LAB — TROPONIN I: Troponin I: <0.03 ng/mL (ref <0.03)

## 2017-03-11 SURGERY — LEFT HEART CATH AND CORS/GRAFTS ANGIOGRAPHY
Anesthesia: LOCAL

## 2017-03-11 MED ORDER — FENTANYL CITRATE (PF) 100 MCG/2ML IJ SOLN
INTRAMUSCULAR | Status: DC | PRN
  Administered 2017-03-11 (×4): 25 ug via INTRAVENOUS

## 2017-03-11 MED ORDER — MORPHINE SULFATE (PF) 2 MG/ML IV SOLN
1.0000 mg | INTRAVENOUS | Status: DC | PRN
  Administered 2017-03-11 – 2017-03-12 (×8): 2 mg via INTRAVENOUS
  Filled 2017-03-11 (×8): qty 1

## 2017-03-11 MED ORDER — MIDAZOLAM HCL 2 MG/2ML IJ SOLN
INTRAMUSCULAR | Status: DC | PRN
Start: 2017-03-11 — End: 2017-03-11
  Administered 2017-03-11: 2 mg via INTRAVENOUS
  Administered 2017-03-11 (×3): 1 mg via INTRAVENOUS

## 2017-03-11 MED ORDER — VERAPAMIL HCL 2.5 MG/ML IV SOLN
INTRAVENOUS | Status: AC
  Filled 2017-03-11: qty 2

## 2017-03-11 MED ORDER — FENTANYL CITRATE (PF) 100 MCG/2ML IJ SOLN
INTRAMUSCULAR | Status: AC
  Filled 2017-03-11: qty 2

## 2017-03-11 MED ORDER — MIDAZOLAM HCL 2 MG/2ML IJ SOLN
INTRAMUSCULAR | Status: AC
  Filled 2017-03-11: qty 2

## 2017-03-11 MED ORDER — SODIUM CHLORIDE 0.9% FLUSH: 3.0000 mL | INTRAVENOUS | Status: DC | PRN

## 2017-03-11 MED ORDER — SODIUM CHLORIDE 0.9 % IV SOLN: 250.0000 mL | INTRAVENOUS | Status: DC | PRN

## 2017-03-11 MED ORDER — HEPARIN SODIUM (PORCINE) 1000 UNIT/ML IJ SOLN
INTRAMUSCULAR | Status: DC | PRN
  Administered 2017-03-11: 4500 [IU] via INTRAVENOUS

## 2017-03-11 MED ORDER — SODIUM CHLORIDE 0.9 % IV SOLN
INTRAVENOUS | Status: AC
  Administered 2017-03-11: 15:00:00 via INTRAVENOUS

## 2017-03-11 MED ORDER — NITROGLYCERIN 1 MG/10 ML FOR IR/CATH LAB
INTRA_ARTERIAL | Status: AC
  Filled 2017-03-11: qty 10

## 2017-03-11 MED ORDER — SODIUM CHLORIDE 0.9% FLUSH
3.0000 mL | Freq: Two times a day (BID) | INTRAVENOUS | Status: DC
  Administered 2017-03-11: 3 mL via INTRAVENOUS

## 2017-03-11 MED ORDER — LIDOCAINE HCL (PF) 1 % IJ SOLN
INTRAMUSCULAR | Status: DC | PRN
  Administered 2017-03-11: 2 mL

## 2017-03-11 MED ORDER — SODIUM CHLORIDE 0.9% FLUSH
3.0000 mL | Freq: Two times a day (BID) | INTRAVENOUS | Status: DC
  Administered 2017-03-11 – 2017-03-12 (×2): 3 mL via INTRAVENOUS

## 2017-03-11 MED ORDER — HEPARIN SODIUM (PORCINE) 1000 UNIT/ML IJ SOLN
INTRAMUSCULAR | Status: AC
  Filled 2017-03-11: qty 1

## 2017-03-11 MED ORDER — IOPAMIDOL (ISOVUE-370) INJECTION 76%
INTRAVENOUS | Status: DC | PRN
  Administered 2017-03-11: 105 mL

## 2017-03-11 MED ORDER — VERAPAMIL HCL 2.5 MG/ML IV SOLN
INTRAVENOUS | Status: DC | PRN
  Administered 2017-03-11: 10 mL via INTRA_ARTERIAL

## 2017-03-11 MED ORDER — ASPIRIN 81 MG PO CHEW: 81.0000 mg | CHEWABLE_TABLET | ORAL | Status: DC

## 2017-03-11 MED ORDER — HEPARIN (PORCINE) IN NACL 2-0.9 UNIT/ML-% IJ SOLN
INTRAMUSCULAR | Status: AC | PRN
  Administered 2017-03-11: 1000 mL

## 2017-03-11 MED ORDER — PANTOPRAZOLE SODIUM 40 MG PO TBEC
40.0000 mg | DELAYED_RELEASE_TABLET | Freq: Every day | ORAL | Status: DC
  Administered 2017-03-11 – 2017-03-13 (×3): 40 mg via ORAL
  Filled 2017-03-11 (×3): qty 1

## 2017-03-11 MED ORDER — SODIUM CHLORIDE 0.9 % WEIGHT BASED INFUSION: 1.0000 mL/kg/h | INTRAVENOUS | Status: DC

## 2017-03-11 MED ORDER — SODIUM CHLORIDE 0.9 % WEIGHT BASED INFUSION
3.0000 mL/kg/h | INTRAVENOUS | Status: DC
  Administered 2017-03-11: 3 mL/kg/h via INTRAVENOUS

## 2017-03-11 MED ORDER — LIDOCAINE HCL (PF) 1 % IJ SOLN
INTRAMUSCULAR | Status: AC
  Filled 2017-03-11: qty 30

## 2017-03-11 MED ORDER — HEPARIN (PORCINE) IN NACL 2-0.9 UNIT/ML-% IJ SOLN
INTRAMUSCULAR | Status: AC
  Filled 2017-03-11: qty 1000

## 2017-03-11 SURGICAL SUPPLY — 14 items
CATH EXPO 5FR FL4 (CATHETERS) ×2 IMPLANT
CATH INFINITI 5FR AL1 (CATHETERS) ×2 IMPLANT
CATH INFINITI 5FR ANG PIGTAIL (CATHETERS) ×2 IMPLANT
CATH INFINITI JR4 5F (CATHETERS) ×2 IMPLANT
CATH OPTITORQUE TIG 4.0 5F (CATHETERS) ×2 IMPLANT
DEVICE RAD COMP TR BAND LRG (VASCULAR PRODUCTS) ×2 IMPLANT
GLIDESHEATH SLEND A-KIT 6F 22G (SHEATH) ×2 IMPLANT
GLIDESHEATH SLEND SS 6F .021 (SHEATH) ×2 IMPLANT
GUIDEWIRE INQWIRE 1.5J.035X260 (WIRE) ×1 IMPLANT
INQWIRE 1.5J .035X260CM (WIRE) ×2
KIT HEART LEFT (KITS) ×2 IMPLANT
PACK CARDIAC CATHETERIZATION (CUSTOM PROCEDURE TRAY) ×2 IMPLANT
TRANSDUCER W/STOPCOCK (MISCELLANEOUS) ×2 IMPLANT
TUBING CIL FLEX 10 FLL-RA (TUBING) ×2 IMPLANT

## 2017-03-11 NOTE — Discharge Instructions (Signed)
Call [facility] at [phone] if any bleeding, swelling or drainage at cath site.  May shower, no tub baths for 48 hours for groin sticks. No lifting over 5 pounds for 3 days.  No Driving for 3 days  Heart Healthy Diet  We increased your Ranexa to 1000 mg twice a day  We added Protonix to your meds.    We will have you see Dr. Irish Lack this coming week to see if you need myoview study or what his recommendations would include.

## 2017-03-11 NOTE — Interval H&P Note (Signed)
History and Physical Interval Note:  03/11/2017 12:55 PM  [name]  has presented today for surgery, with the diagnosis of unstable angina.  The various methods of treatment have been discussed with the patient and family. After consideration of risks, benefits and other options for treatment, the patient has consented to  Procedure(s): Left Heart Cath and Cors/Grafts Angiography (N/A) with possible Percutaneous Coronary Intervention as a surgical intervention .  The patient's history has been reviewed, patient examined, no change in status, stable for surgery.  I have reviewed the patient's chart and labs.  Questions were answered to the patient's satisfaction.    Cath Lab Visit (complete for each Cath Lab visit)  Clinical Evaluation Leading to the Procedure:   ACS: Yes.    Non-ACS:    Anginal Classification: CCS III  Anti-ischemic medical therapy: Maximal Therapy (2 or more classes of medications)  Non-Invasive Test Results: No non-invasive testing performed  Prior CABG: Previous CABG    Glenetta Hew

## 2017-03-11 NOTE — H&P (View-Only) (Signed)
Progress Note  Patient Name: [name] Date of Encounter: 03/11/2017  Primary Cardiologist: Dr. Irish Lack  Subjective   Episode of chest pain last pm morphine with relief.  Associated with nausea.  Inpatient Medications    Scheduled Meds: . amLODipine  5 mg Oral Daily  . aspirin EC  81 mg Oral Daily  . atorvastatin  80 mg Oral Daily  . clopidogrel  75 mg Oral Daily  . ezetimibe  10 mg Oral Daily  . heparin  5,000 Units Subcutaneous Q8H  . metoprolol tartrate  25 mg Oral Daily  . nitroGLYCERIN  1 inch Topical Q6H  . ranolazine  1,000 mg Oral BID   Continuous Infusions:  PRN Meds: acetaminophen, chlordiazePOXIDE, morphine injection, nitroGLYCERIN, ondansetron (ZOFRAN) IV, traZODone   Vital Signs    Vitals:   03/10/17 2026 03/11/17 0055 03/11/17 0101 03/11/17 0500  BP: 103/69 121/81 99/70 138/85  Pulse: (!) 59 69 80 83  Resp: 11 14 15 14   Temp: 97.7 F (36.5 C)   97.6 F (36.4 C)  TempSrc: Oral   Oral  SpO2: 97% 99% 99% 100%  Weight:    187 lb 4.8 oz (85 kg)  Height:        Intake/Output Summary (Last 24 hours) at 03/11/17 0723 Last data filed at 03/11/17 0300  Gross per 24 hour  Intake             1240 ml  Output             1070 ml  Net              170 ml   Filed Weights   03/10/17 0859 03/10/17 1839 03/11/17 0500  Weight: 185 lb (83.9 kg) 188 lb 9.6 oz (85.5 kg) 187 lb 4.8 oz (85 kg)    Telemetry    SR to ST   HR 80s to t 124 this AM  - Personally Reviewed  ECG    SR  No acute changes from yesterday- Personally Reviewed  Physical Exam   GEN: No acute distress.   Neck: No JVD Cardiac: RRR, no murmurs, rubs, or gallops.  Respiratory: Clear to auscultation bilaterally. GI: Soft, nontender, non-distended  MS: No edema; No deformity. Neuro:  Nonfocal  Psych: Normal affect   Labs    Chemistry Recent Labs Lab 03/10/17 0400 03/10/17 1300 03/11/17 0131  NA 137  --  137  K 4.6  --  4.5  CL 104  --  104  CO2 27  --  28  GLUCOSE 105*   --  100*  BUN 21*  --  14  CREATININE 1.10 1.01 1.20  CALCIUM 9.6  --  9.0  PROT 6.4*  --   --   ALBUMIN 4.1  --   --   AST 20  --   --   ALT 27  --   --   ALKPHOS 69  --   --   BILITOT 0.4  --   --   GFRNONAA >60 >60 >60  GFRAA >60 >60 >60  ANIONGAP 6  --  5     Hematology Recent Labs Lab 03/10/17 0400 03/10/17 1300  WBC 9.6 8.3  RBC 4.58 4.83  HGB 13.6 14.6  HCT 42.1 44.3  MCV 91.9 91.7  MCH 29.7 30.2  MCHC 32.3 33.0  RDW 13.4 13.3  PLT 185 180    Cardiac Enzymes Recent Labs Lab 03/10/17 0400 03/10/17 1300 03/10/17 1836 03/11/17 0131  TROPONINI <0.03 <  0.03 <0.03 <0.03   No results for input(s): TROPIPOC in the last 168 hours.   BNPNo results for input(s): BNP, PROBNP in the last 168 hours.   DDimer  Recent Labs Lab 03/10/17 0400  DDIMER <0.27     Radiology    Dg Chest 2 View  Result Date: 03/10/2017 CLINICAL DATA:  61 year old male with chest and abdominal pain. EXAM: CHEST  2 VIEW COMPARISON:  Chest radiograph dated 01/04/2017 FINDINGS: The lungs are clear. There is no pleural effusion or pneumothorax. The cardiac silhouette is within normal limits. Median sternotomy wires and CABG vascular clips noted. No acute osseous pathology. IMPRESSION: No active cardiopulmonary disease. Electronically Signed   By: Anner Crete M.D.   On: 03/10/2017 04:23   US Abdomen Limited Ruq  Result Date: 03/10/2017 CLINICAL DATA:  61 year old male with nausea vomiting. EXAM: ULTRASOUND ABDOMEN LIMITED RIGHT UPPER QUADRANT COMPARISON:  None. FINDINGS: Gallbladder: No gallstones or wall thickening visualized. No sonographic Murphy sign noted by sonographer. Common bile duct: Diameter: 3 mm Liver: Unremarkable as visualized. The main portal vein is patent with hepatopetal flow. IMPRESSION: Unremarkable right upper quadrant ultrasound. Electronically Signed   By: Anner Crete M.D.   On: 03/10/2017 05:12    Cardiac Studies   None this admit  Patient Profile     61  y.o. male with past medical history of CAD s/p CABG, hypertension, hyperlipidemia, GERD and former tobacco use who presented to the ED with ongoing chest pain.  Assessment & Plan   1  CP episode last pm, pt had been doing well best he has felt until this episode.  Neg troponins  No EKG changes,  abd U/S neg.  I have reviewed pts anatomy with him from recent cath   He says that when Ranexa was started he started feeling better  Actually did very well  Woking ing garden, etc.  Now he has had abrupt development of chest tightness, pain  Like his prvious angina  Associated with SOB  Woke up in a sweat.   Concerning about change   ? If something has changed since January 2018 cath  I would recomm L heart cath (He has had many) to compare, see if a new lesion to intervene on.  I do not think related to graft that is occluded as this should be unchanged Pt understands and agrees to proceed.   2  HL  Continue lipitor and Zetia   3.  CAD s/p CABG   As above   Signed, Dorris Carnes, MD  03/11/2017, 7:23 AM

## 2017-03-11 NOTE — Progress Notes (Signed)
Will keep pt overnight and have him ambulate.  Want to see if pain resolved on higher dose of Ranexa.

## 2017-03-11 NOTE — Progress Notes (Addendum)
Progress Note  Patient Name: [name] Date of Encounter: 03/11/2017  Primary Cardiologist: Dr. Irish Lack  Subjective   Episode of chest pain last pm morphine with relief.  Associated with nausea.  Inpatient Medications    Scheduled Meds: . amLODipine  5 mg Oral Daily  . aspirin EC  81 mg Oral Daily  . atorvastatin  80 mg Oral Daily  . clopidogrel  75 mg Oral Daily  . ezetimibe  10 mg Oral Daily  . heparin  5,000 Units Subcutaneous Q8H  . metoprolol tartrate  25 mg Oral Daily  . nitroGLYCERIN  1 inch Topical Q6H  . ranolazine  1,000 mg Oral BID   Continuous Infusions:  PRN Meds: acetaminophen, chlordiazePOXIDE, morphine injection, nitroGLYCERIN, ondansetron (ZOFRAN) IV, traZODone   Vital Signs    Vitals:   03/10/17 2026 03/11/17 0055 03/11/17 0101 03/11/17 0500  BP: 103/69 121/81 99/70 138/85  Pulse: (!) 59 69 80 83  Resp: 11 14 15 14   Temp: 97.7 F (36.5 C)   97.6 F (36.4 C)  TempSrc: Oral   Oral  SpO2: 97% 99% 99% 100%  Weight:    187 lb 4.8 oz (85 kg)  Height:        Intake/Output Summary (Last 24 hours) at 03/11/17 0723 Last data filed at 03/11/17 0300  Gross per 24 hour  Intake             1240 ml  Output             1070 ml  Net              170 ml   Filed Weights   03/10/17 0859 03/10/17 1839 03/11/17 0500  Weight: 185 lb (83.9 kg) 188 lb 9.6 oz (85.5 kg) 187 lb 4.8 oz (85 kg)    Telemetry    SR to ST   HR 80s to t 124 this AM  - Personally Reviewed  ECG    SR  No acute changes from yesterday- Personally Reviewed  Physical Exam   GEN: No acute distress.   Neck: No JVD Cardiac: RRR, no murmurs, rubs, or gallops.  Respiratory: Clear to auscultation bilaterally. GI: Soft, nontender, non-distended  MS: No edema; No deformity. Neuro:  Nonfocal  Psych: Normal affect   Labs    Chemistry Recent Labs Lab 03/10/17 0400 03/10/17 1300 03/11/17 0131  NA 137  --  137  K 4.6  --  4.5  CL 104  --  104  CO2 27  --  28  GLUCOSE 105*   --  100*  BUN 21*  --  14  CREATININE 1.10 1.01 1.20  CALCIUM 9.6  --  9.0  PROT 6.4*  --   --   ALBUMIN 4.1  --   --   AST 20  --   --   ALT 27  --   --   ALKPHOS 69  --   --   BILITOT 0.4  --   --   GFRNONAA >60 >60 >60  GFRAA >60 >60 >60  ANIONGAP 6  --  5     Hematology Recent Labs Lab 03/10/17 0400 03/10/17 1300  WBC 9.6 8.3  RBC 4.58 4.83  HGB 13.6 14.6  HCT 42.1 44.3  MCV 91.9 91.7  MCH 29.7 30.2  MCHC 32.3 33.0  RDW 13.4 13.3  PLT 185 180    Cardiac Enzymes Recent Labs Lab 03/10/17 0400 03/10/17 1300 03/10/17 1836 03/11/17 0131  TROPONINI <0.03 <  0.03 <0.03 <0.03   No results for input(s): TROPIPOC in the last 168 hours.   BNPNo results for input(s): BNP, PROBNP in the last 168 hours.   DDimer  Recent Labs Lab 03/10/17 0400  DDIMER <0.27     Radiology    Dg Chest 2 View  Result Date: 03/10/2017 CLINICAL DATA:  61 year old male with chest and abdominal pain. EXAM: CHEST  2 VIEW COMPARISON:  Chest radiograph dated 01/04/2017 FINDINGS: The lungs are clear. There is no pleural effusion or pneumothorax. The cardiac silhouette is within normal limits. Median sternotomy wires and CABG vascular clips noted. No acute osseous pathology. IMPRESSION: No active cardiopulmonary disease. Electronically Signed   By: Anner Crete M.D.   On: 03/10/2017 04:23   US Abdomen Limited Ruq  Result Date: 03/10/2017 CLINICAL DATA:  61 year old male with nausea vomiting. EXAM: ULTRASOUND ABDOMEN LIMITED RIGHT UPPER QUADRANT COMPARISON:  None. FINDINGS: Gallbladder: No gallstones or wall thickening visualized. No sonographic Murphy sign noted by sonographer. Common bile duct: Diameter: 3 mm Liver: Unremarkable as visualized. The main portal vein is patent with hepatopetal flow. IMPRESSION: Unremarkable right upper quadrant ultrasound. Electronically Signed   By: Anner Crete M.D.   On: 03/10/2017 05:12    Cardiac Studies   None this admit  Patient Profile     61  y.o. male with past medical history of CAD s/p CABG, hypertension, hyperlipidemia, GERD and former tobacco use who presented to the ED with ongoing chest pain.  Assessment & Plan   1  CP episode last pm, pt had been doing well best he has felt until this episode.  Neg troponins  No EKG changes,  abd U/S neg.  I have reviewed pts anatomy with him from recent cath   He says that when Ranexa was started he started feeling better  Actually did very well  Woking ing garden, etc.  Now he has had abrupt development of chest tightness, pain  Like his prvious angina  Associated with SOB  Woke up in a sweat.   Concerning about change   ? If something has changed since January 2018 cath  I would recomm L heart cath (He has had many) to compare, see if a new lesion to intervene on.  I do not think related to graft that is occluded as this should be unchanged Pt understands and agrees to proceed.   2  HL  Continue lipitor and Zetia   3.  CAD s/p CABG   As above   Signed, Dorris Carnes, MD  03/11/2017, 7:23 AM

## 2017-03-12 DIAGNOSIS — E785 Hyperlipidemia, unspecified: Secondary | ICD-10-CM | POA: Diagnosis not present

## 2017-03-12 DIAGNOSIS — I200 Unstable angina: Secondary | ICD-10-CM | POA: Diagnosis not present

## 2017-03-12 DIAGNOSIS — I25119 Atherosclerotic heart disease of native coronary artery with unspecified angina pectoris: Secondary | ICD-10-CM

## 2017-03-12 DIAGNOSIS — I25700 Atherosclerosis of coronary artery bypass graft(s), unspecified, with unstable angina pectoris: Secondary | ICD-10-CM | POA: Diagnosis not present

## 2017-03-12 DIAGNOSIS — I10 Essential (primary) hypertension: Secondary | ICD-10-CM | POA: Diagnosis not present

## 2017-03-12 MED ORDER — MAGNESIUM HYDROXIDE 400 MG/5ML PO SUSP
30.0000 mL | Freq: Every day | ORAL | Status: DC | PRN
  Administered 2017-03-12: 30 mL via ORAL
  Filled 2017-03-12 (×2): qty 30

## 2017-03-12 MED ORDER — AMLODIPINE BESYLATE 2.5 MG PO TABS: 2.5000 mg | ORAL_TABLET | Freq: Every day | ORAL | Status: DC

## 2017-03-12 MED ORDER — AMLODIPINE BESYLATE 5 MG PO TABS
7.5000 mg | ORAL_TABLET | Freq: Every day | ORAL | Status: DC
  Administered 2017-03-13: 7.5 mg via ORAL
  Filled 2017-03-12: qty 1

## 2017-03-12 MED ORDER — AMLODIPINE BESYLATE 2.5 MG PO TABS
2.5000 mg | ORAL_TABLET | Freq: Once | ORAL | Status: AC
  Administered 2017-03-12: 2.5 mg via ORAL
  Filled 2017-03-12: qty 1

## 2017-03-12 NOTE — Progress Notes (Signed)
CARDIAC REHAB PHASE I   PRE:  Rate/Rhythm: 97 SR  BP:  Supine: 137/84  Sitting:   Standing:    SaO2: 98%RA  MODE:  Ambulation: 1100 ft   POST:  Rate/Rhythm: 114 ST  BP:  Supine:   Sitting: 147/82  Standing:    SaO2: 98%RA 0810-0840 Pt stated he felt chest tightness 5 on scale 1 to 10 prior to walk and did not change with activity. He stated he walked last night about 15 minutes and did not affect chest discomfort. I monitored heart rate and stayed below 115. Pt walked 1100 ft with steady gait. C/o a little lightheadedness once. Reviewed NTG use. Offered CRP 2 to pt if he is interested. Pt was unable to complete program after CABG due to work schedule and he stated that he felt his work schedule would not be conducive to program now. Left brochure in case he changes his mind. Cardiologist would have to agree to stable angina diagnosis if pt changes his mind about attending. Pt stated he was active prior to this event and wants to continue exercising on his own.    Graylon Good, RN BSN  03/12/2017 8:37 AM

## 2017-03-12 NOTE — Progress Notes (Signed)
Progress Note  Patient Name: [name] Date of Encounter: 03/12/2017  Primary Cardiologist: [place] to have 3/10 chest discomfort at rest. No clear worsening with activity. Nitroglycerin provides inconsistent relief, but temporarily. Reports that he did not tolerate long-acting nitrates due to headache. Ranolazine dose increased roughly 36 hours ago.  Inpatient Medications    Scheduled Meds: . amLODipine  2.5 mg Oral Once  . [START ON 03/13/2017] amLODipine  7.5 mg Oral Daily  . aspirin EC  81 mg Oral Daily  . atorvastatin  80 mg Oral Daily  . clopidogrel  75 mg Oral Daily  . ezetimibe  10 mg Oral Daily  . heparin  5,000 Units Subcutaneous Q8H  . metoprolol tartrate  25 mg Oral Daily  . nitroGLYCERIN  1 inch Topical Q6H  . pantoprazole  40 mg Oral Daily  . ranolazine  1,000 mg Oral BID  . sodium chloride flush  3 mL Intravenous Q12H   Continuous Infusions: . sodium chloride     PRN Meds: sodium chloride, acetaminophen, chlordiazePOXIDE, magnesium hydroxide, morphine injection, nitroGLYCERIN, ondansetron (ZOFRAN) IV, sodium chloride flush, traZODone   Vital Signs    Vitals:   03/11/17 2239 03/11/17 2240 03/12/17 0648 03/12/17 1044  BP:  110/77 138/81 136/85  Pulse:  78 76 86  Resp: 15 12 13    Temp:  97.6 F (36.4 C) 97.9 F (36.6 C)   TempSrc:  Oral Oral   SpO2:  97% 99%   Weight:   187 lb 3.2 oz (84.9 kg)   Height:        Intake/Output Summary (Last 24 hours) at 03/12/17 1346 Last data filed at 03/12/17 1200  Gross per 24 hour  Intake           750.75 ml  Output             1600 ml  Net          -849.25 ml   Filed Weights   03/10/17 1839 03/11/17 0500 03/12/17 0648  Weight: 188 lb 9.6 oz (85.5 kg) 187 lb 4.8 oz (85 kg) 187 lb 3.2 oz (84.9 kg)    Telemetry    NSR - Personally Reviewed  ECG    NSR - Personally Reviewed  Physical Exam  Appears anxious GEN: No acute distress.   Neck: No JVD Cardiac: RRR, no murmurs,  rubs, or gallops.  Respiratory: Clear to auscultation bilaterally. GI: Soft, nontender, non-distended  MS: No edema; No deformity. Neuro:  Nonfocal  Psych: Normal affect   Labs    Chemistry Recent Labs Lab 03/10/17 0400 03/10/17 1300 03/11/17 0131  NA 137  --  137  K 4.6  --  4.5  CL 104  --  104  CO2 27  --  28  GLUCOSE 105*  --  100*  BUN 21*  --  14  CREATININE 1.10 1.01 1.20  CALCIUM 9.6  --  9.0  PROT 6.4*  --   --   ALBUMIN 4.1  --   --   AST 20  --   --   ALT 27  --   --   ALKPHOS 69  --   --   BILITOT 0.4  --   --   GFRNONAA >60 >60 >60  GFRAA >60 >60 >60  ANIONGAP 6  --  5     Hematology Recent Labs Lab 03/10/17 0400 03/10/17 1300  WBC 9.6 8.3  RBC 4.58 4.83  HGB 13.6  14.6  HCT 42.1 44.3  MCV 91.9 91.7  MCH 29.7 30.2  MCHC 32.3 33.0  RDW 13.4 13.3  PLT 185 180    Cardiac Enzymes Recent Labs Lab 03/10/17 0400 03/10/17 1300 03/10/17 1836 03/11/17 0131  TROPONINI <0.03 <0.03 <0.03 <0.03   No results for input(s): TROPIPOC in the last 168 hours.   BNPNo results for input(s): BNP, PROBNP in the last 168 hours.   DDimer  Recent Labs Lab 03/10/17 0400  DDIMER <0.27     Radiology    No results found.  Cardiac Studies   03/11/2017 CATH  Angiographically very similar to images from January 2018  Ost LM lesion, 60 %stenosed.  Ost LAD to Prox LAD lesion, 75 %stenosed. Mid LAD lesion, 55 %stenosed.  LIMA graft was visualized by angiography and is normal in caliber and anatomically normal.  SVG-Diag1- Known to be occluded 100%  Ost Ramus lesion, 90 %stenosed. Ramus lesion, 75 %stenosed.  Ost Cx lesion, 80 %stenosed. Prox Cx to Mid Cx lesion, 50 %stenosed.  Seq SVG- Ramus & OM2 graft was visualized by angiography and is large. Prox Graft to Mid Graft lesion, 40 %stenosed. Mid Graft to Dist Graft lesion, 40 %stenosed.  Ost RCA to Prox RCA lesion, 50 %stenosed. Dist RCA lesion, 50 %stenosed. RPDA lesion, 40 %stenosed - at the  insertion of SVG  SVG-rPDA: Origin to Prox Graft lesion, 70 %stenosed. The lesion is identical to January 2018. The flow in the graft is reversed.  The left ventricular ejection fraction is 55-65% by visual estimate. The left ventricular systolic function is normal.  LV end diastolic pressure is normal.   Her take a few percentage points, the patient's angiographic images are almost identical to January 2018. No obvious culprit lesion to explain the patient's symptoms.  The SVG-RPDA does have stable proximal roughly 70% smooth lesion, but there is competitive flow in the PDA with brisk flow down the native PDA.  The fact that this is similar to January 2018 would mean that the patient is not likely to be asymptomatic from this.  Would consider microvascular ischemia versus non-anginal chest pain. The patient has had several cardiac catheterizations for similar symptoms that have been roughly similar. Would recommend some type of noninvasive evaluation to have is a new baseline based upon these angiographic Images that can be used as a benchmark for future evaluations.  Patient Profile     61 y.o. male with extensive coronary artery disease, but with all major coronary territories well perfused and stable anatomy on multiple repeat cardiac catheterization procedures. Symptoms occur at rest, but respond to nitrates.  Assessment & Plan    We'll increase the dose of amlodipine since there is a possibility that he has coronary vasospasm. Ranexa was only recently increased to the true therapeutic dose. Observe for another 24 hours. His symptoms respond to nitrates, but he reports that the headache was not tolerable, even after she tried for a full week. Consider dosing the medication before bedtime. Expected discharge tomorrow.  Signed, Sanda Klein, MD  03/12/2017, 1:46 PM

## 2017-03-13 DIAGNOSIS — I200 Unstable angina: Secondary | ICD-10-CM | POA: Diagnosis not present

## 2017-03-13 MED ORDER — AMLODIPINE BESYLATE 5 MG PO TABS: 7.5000 mg | ORAL_TABLET | Freq: Every day | ORAL | 6 refills | Status: DC

## 2017-03-13 MED ORDER — RANOLAZINE ER 1000 MG PO TB12: 1000.0000 mg | ORAL_TABLET | Freq: Two times a day (BID) | ORAL | 11 refills | Status: DC

## 2017-03-13 NOTE — Progress Notes (Signed)
Clinical Social Worker was consulted by patient nurse for transportation assistance. CSW assisted patient with cab voucher home [phone] [facility] [address] Voucher was given to the RN. CSW signing off as patients needs have been met.   Rhea Pink, MSW,  [place]

## 2017-03-13 NOTE — Discharge Summary (Signed)
Discharge Summary    Patient ID: [name]  MRN: [id number], DOB/AGE: 62-Dec-1957 62 y.o.  Admit Date: 03/10/2017 Discharge Date: 03/13/2017  Primary Care Provider: [name], MD Primary Cardiologist: Dr. Irish Lack, MD  Discharge Diagnoses    Principal Problem:   Unstable angina [facility]) Active Problems:   Coronary artery disease involving native coronary artery of native heart with angina pectoris (HCC)   S/P CABG x 5   Dyslipidemia, goal LDL below 70   Essential hypertension   Coronary artery disease involving coronary bypass graft of native heart with unstable angina pectoris (HCC)   Chest pain   Allergies Allergies  Allergen Reactions  . Prednisone Other (See Comments)    States that this med makes him "crazy"  . Tetanus Toxoids Swelling and Other (See Comments)    Fever, Swelling of the arm   . Wellbutrin [Bupropion] Other (See Comments)    Crazy thoughts, nightmares  . Chantix [Varenicline] Other (See Comments)    Dreams     History of Present Illness     61 year old male with history of CAD (STEMI 10/2015 s/p POBA of LAD with IABP and subsequent emergent CABG), chronic systolic CHF/ICM with prior EF of 45-50% now improved to 55-60% by TTE in 10/2016, HCV s/p treatment, HTN, HLD, prior tobacco abuse, GERD, and anxiety who was admitted to [facility] on 03/10/2017 with ongoing chest pain.   He has presented multiple times with chronic recurrent chest pain since undergoing CABG over a year ago. He has undergone 3 cardiac caths since that time in 8/17, 10/17 and 1/18 demonstrating moderate stable multivessel CAD with patent vein grafts with areas of nonobstructive disease. Does have 1 diagonal graft that is occluded. He last presented to the ED on 01/04/17 with recurrent chest pain and elevated blood pressure. He was admitted ruled out with enzymes. Added amlodipine 5 mg daily and improved his blood pressures. Continue to complain of chest pain throughout admission  and Ranexa was added to his medication regimen. He was discharged and underwent outpatient exercise treadmill that was negative for ischemia. Returned to work full time. Continued to report intermittent episodes of chest pain, uses sublingual nitroglycerin every couple of weeks for these episodes which usually resolve. He developed centralized chest pain around midnight on 7/12 with radiation into the upper abdomen that was worse with deep inspiration. Reported taking 5 sublingual nitroglycerin without any relief. Attempted to take an antacid without relief. Unable to sleep. Also reported diaphoresis, along with nausea and vomiting x1.  Hospital Course     Consultants: cardiac rehab  Upon arrival to the ED his electrolytes were stable and had a negative troponin x 1, followed by ruling out. EKG showed sinus rhythm without acute st/t changes. CXR was negative. He underwent abdominal ultrasound that was negative. He was given IV Fentanyl with little relief. His Ranexa was increased to 1000 mg bid. He has previously been intolerant to long-acting nitroglycerin 2/2 headaches, though his symptoms did respond to this medication. His heart rate in the upper 50s bpm precluded further titration of his beta blocker. He continued to note chest pain and thus underwent LHC on 7/13 that showed his angiographic images were almost identical to 08/2016. No obvious culprit lesion was noted to explain the patient's symptoms. The SVG-RPDA did have stable proximal roughly 70% stenotic smooth lesion, but there was competitive flow in the PDA with brisk flow down the native PDA. The fact that this was similar to 08/2016, was  felt to mean that the patient was not likely to be symptomatic from that lesion.   The patient's left radial cath site has been examined is healing well without issues at this time. The patient has been seen by Dr. Sallyanne Kuster, MD and felt to be stable for discharge today. All follow up appointments have been made.  Discharge medications are listed below. Prescriptions have been reviewed with the patient and sent in to their pharmacy. Consider microvascular ischemia vs non-anginal chest pain given he has had several cardiac catheterization for similar symptoms that have been roughly similar. Recommendation of some type of noninvasive to have as a new baseline based upon these angiographic images that could be used as a benchmark for future evaluations. He worked with cardiac rehab, noting 5/10 chest pain prior to walk that did not change with activity. On rounds on 7/14 he continued to note 3/10 chest discomfort at rest that did not worsen with activity. His amlodipine was increased to 7.5 mg daily for treatment of possible coronary vasospasm. In round on 7/15 he was not completely asymptomatic, though did not require/ask for any further pain medications. It was felt there was a significant issue with anxiety that he knows he has as well. He was continued on Ranex 1000 mg bid as well as increased dose of amlodipine 7.5 mg daily. It should be considered in the outpatient setting for the patient to undergo a trial of SSRI or similar medication as maintenance therapy. He was also advised to continue non-pharmacological measure to help cope with anxiety.   The patient's left radial cath site has been examined is healing well without issues at this time. The patient has been seen by Dr. Recardo Evangelist, MD and felt to be stable for discharge today. All follow up appointments have been made. Discharge medications are listed below. Prescriptions have been reviewed with the patient and sent in to their pharmacy.   _____________  Discharge Vitals Blood pressure 117/80, pulse 69, temperature 97.9 F (36.6 C), temperature source Oral, resp. rate 15, height 5\' 9"  (1.753 m), weight 188 lb 3.2 oz (85.4 kg), SpO2 99 %.  Filed Weights   03/11/17 0500 03/12/17 0648 03/13/17 0435  Weight: 187 lb 4.8 oz (85 kg) 187 lb 3.2 oz (84.9 kg) 188 lb  3.2 oz (85.4 kg)    Labs & Radiologic Studies    CBC  Recent Labs  03/10/17 1300  WBC 8.3  HGB 14.6  HCT 44.3  MCV 91.7  PLT 629   Basic Metabolic Panel  Recent Labs  03/10/17 1300 03/11/17 0131  NA  --  137  K  --  4.5  CL  --  104  CO2  --  28  GLUCOSE  --  100*  BUN  --  14  CREATININE 1.01 1.20  CALCIUM  --  9.0   Liver Function Tests No results for input(s): AST, ALT, ALKPHOS, BILITOT, PROT, ALBUMIN in the last 72 hours. No results for input(s): LIPASE, AMYLASE in the last 72 hours. Cardiac Enzymes  Recent Labs  03/10/17 1300 03/10/17 1836 03/11/17 0131  TROPONINI <0.03 <0.03 <0.03   BNP Invalid input(s): POCBNP D-Dimer No results for input(s): DDIMER in the last 72 hours. Hemoglobin A1C No results for input(s): HGBA1C in the last 72 hours. Fasting Lipid Panel No results for input(s): CHOL, HDL, LDLCALC, TRIG, CHOLHDL, LDLDIRECT in the last 72 hours. Thyroid Function Tests No results for input(s): TSH, T4TOTAL, T3FREE, THYROIDAB in the last 72 hours.  Invalid  input(s): FREET3 _____________  Dg Chest 2 View  Result Date: 03/10/2017 IMPRESSION: No active cardiopulmonary disease. Electronically Signed   By: Anner Crete M.D.   On: 03/10/2017 04:23   US Abdomen Limited Ruq  Result Date: 03/10/2017 IMPRESSION: Unremarkable right upper quadrant ultrasound. Electronically Signed   By: Anner Crete M.D.   On: 03/10/2017 05:12    Diagnostic Studies/Procedures   [facility] 03/11/2017: Coronary Findings   Dominance: Right  Left Main  Vessel is large.  Ost LM lesion, 60% stenosed.  Left Anterior Descending  Ost LAD to Prox LAD lesion, 75% stenosed.  Mid LAD lesion, 55% stenosed. The lesion is irregular.  Lateral First Diagonal Branch  Vessel is moderate in size.  First Septal Branch  Vessel is moderate in size.  Second Diagonal Branch  Vessel is moderate in size.  Second Septal Branch  Vessel is small in size.  Third Diagonal Branch    Vessel is small in size.  Third Septal Branch  Vessel is small in size.  Ramus Intermedius  Vessel is small.  Ost Ramus lesion, 90% stenosed.  Ramus lesion, 75% stenosed.  Left Circumflex  Vessel is large.  Ost Cx lesion, 80% stenosed. The lesion is focal.  Prox Cx to Mid Cx lesion, 50% stenosed. The lesion is smooth.  Second Obtuse Marginal Branch  Vessel is small in size.  Right Coronary Artery  Vessel is large.  Ost RCA to Prox RCA lesion, 50% stenosed. The lesion is focal and concentric. The lesion is mildly calcified.  Dist RCA lesion, 50% stenosed. The lesion is located at the bifurcation and discrete.  Acute Marginal Branch  Vessel is small in size.  Right Posterior Descending Artery  The vessel exhibits minimal luminal irregularities.  RPDA lesion, 40% stenosed.  First Right Posterolateral  Vessel is small in size.  Second Right Posterolateral  Vessel is small in size.  Graft Angiography  Free LIMA Graft to Dist LAD  LIMA graft was visualized by angiography and is normal in caliber and anatomically normal. The flow in the graft is reversed.  saphenous Graft to RPDA  SVG graft was visualized by angiography and is large. The flow in the graft is reversed. There is competitive flow.  Origin to Prox Graft lesion, 70% stenosed. The lesion is spasm. Between the ostium and a proximal valve No change from prior catheterization  saphenous Graft to 1st Diag  SVG.  Origin to Prox Graft lesion, 100% stenosed.  Sequential jump graft Graft to Ramus, 3rd Mrg  Seq SVG- Ramus & OM2 graft was visualized by angiography and is large.  Mid Graft to Dist Graft lesion before Ramus, 40% stenosed. The lesion is tubular.  Prox Graft to Mid Graft lesion between Ramus and 3rd Mrg, 40% stenosed. The lesion is smooth.  Wall Motion              Left Heart   Left Ventricle The left ventricular size is normal. The left ventricular systolic function is normal. LV end diastolic pressure is  normal. The left ventricular ejection fraction is 55-65% by visual estimate.    Aortic Valve There is no aortic valve stenosis. There is normal aortic valve motion.    Coronary Diagrams   Diagnostic Diagram        The patient's angiographic images are almost identical to January 2018. No obvious culprit lesion to explain the patient's symptoms.  The SVG-RPDA does have stable proximal roughly 70% smooth lesion, but there is competitive flow in the  PDA with brisk flow down the native PDA.  The fact that this is similar to January 2018 would mean that the patient is not likely to be asymptomatic from this.  Would consider microvascular ischemia versus non-anginal chest pain. The patient has had several cardiac catheterizations for similar symptoms that have been roughly similar. Would recommend some type of noninvasive evaluation to have is a new baseline based upon these angiographic Images that can be used as a benchmark for future evaluations.  Standard post radial cath care. _____________  Disposition   Pt is being discharged home today in good condition.  Follow-up Plans & Appointments    Follow-up Information    [name], MD Follow up.   Specialties:  Cardiology, Radiology, Interventional Cardiology Why:  the office will call you Monday for appt.   Contact information: [address] [place] [address] [phone]          Discharge Instructions    Call MD for:  difficulty breathing, headache or visual disturbances    Complete by:  As directed    Call MD for:  extreme fatigue    Complete by:  As directed    Call MD for:  hives    Complete by:  As directed    Call MD for:  persistant dizziness or light-headedness    Complete by:  As directed    Call MD for:  persistant nausea and vomiting    Complete by:  As directed    Call MD for:  redness, tenderness, or signs of infection (pain, swelling, redness, odor or green/yellow  discharge around incision site)    Complete by:  As directed    Call MD for:  severe uncontrolled pain    Complete by:  As directed    Call MD for:  temperature >100.4    Complete by:  As directed    Diet - low sodium heart healthy    Complete by:  As directed    Increase activity slowly    Complete by:  As directed       Discharge Medications      Medication List    TAKE these medications   amLODipine 5 MG tablet Commonly known as:  NORVASC Take 1.5 tablets (7.5 mg total) by mouth daily. What changed:  how much to take   aspirin 81 MG tablet Take 81 mg by mouth daily.   atorvastatin 80 MG tablet Commonly known as:  LIPITOR Take 1 tablet (80 mg total) by mouth daily.   chlordiazePOXIDE 10 MG capsule Commonly known as:  LIBRIUM Take 10 mg by mouth daily as needed for anxiety.   clopidogrel 75 MG tablet Commonly known as:  PLAVIX take 1 tablet by mouth once daily   ezetimibe 10 MG tablet Commonly known as:  ZETIA Take 1 tablet (10 mg total) by mouth daily.   lisinopril 5 MG tablet Commonly known as:  PRINIVIL,ZESTRIL Take 5 mg by mouth daily. May take 1 additional tablet once daily for SBP greater than 150 once hour after taking daily dose   metoprolol tartrate 25 MG tablet Commonly known as:  LOPRESSOR Take 25 mg by mouth daily.   nitroGLYCERIN 0.4 MG SL tablet Commonly known as:  NITROSTAT Place 0.4 mg under the tongue every 5 (five) minutes as needed for chest pain. 3 DOSE MAX   ONE-A-DAY MENS 50+ ADVANTAGE PO Take 1 tablet by mouth daily.   ranitidine 150 MG tablet Commonly known as:  ZANTAC Take 150 mg by mouth daily as needed for heartburn.   ranolazine 1000 MG SR tablet Commonly known as:  RANEXA Take 1 tablet (1,000 mg total) by mouth 2 (two) times daily. What changed:  medication strength  how much to take   traZODone 50 MG tablet Commonly known as:  DESYREL Take 50 mg by mouth at bedtime as needed for sleep.        Aspirin  prescribed at discharge?  Yes High Intensity Statin Prescribed? (Lipitor 40-80mg  or Crestor 20-40mg ): Yes Beta Blocker Prescribed? Yes For EF <40%, was ACEI/ARB Prescribed? No: EF > 40% ADP Receptor Inhibitor Prescribed? (i.e. Plavix etc.-Includes Medically Managed Patients): Yes For EF <40%, Aldosterone Inhibitor Prescribed? No: EF > 40% Was EF assessed during THIS hospitalization? Yes Was Cardiac Rehab II ordered? (Included Medically managed Patients): Yes   Outstanding Labs/Studies   None.   Duration of Discharge Encounter   Greater than 30 minutes including physician time.  Signed, Rise Mu, PA-C [place] Pager: [phone] 03/13/2017, 10:01 AM

## 2017-03-13 NOTE — Progress Notes (Signed)
Progress Note  Patient Name: [name] Date of Encounter: 03/13/2017  Primary Cardiologist: [place]   Not completely asymptomatic, but has not requested pain meds since yesterday. Anxiety remains a major problem, but he believes he is managing it better recently.  Inpatient Medications    Scheduled Meds: . amLODipine  7.5 mg Oral Daily  . aspirin EC  81 mg Oral Daily  . atorvastatin  80 mg Oral Daily  . clopidogrel  75 mg Oral Daily  . ezetimibe  10 mg Oral Daily  . heparin  5,000 Units Subcutaneous Q8H  . metoprolol tartrate  25 mg Oral Daily  . nitroGLYCERIN  1 inch Topical Q6H  . pantoprazole  40 mg Oral Daily  . ranolazine  1,000 mg Oral BID  . sodium chloride flush  3 mL Intravenous Q12H   Continuous Infusions: . sodium chloride     PRN Meds: sodium chloride, acetaminophen, chlordiazePOXIDE, magnesium hydroxide, morphine injection, nitroGLYCERIN, ondansetron (ZOFRAN) IV, sodium chloride flush, traZODone   Vital Signs    Vitals:   03/12/17 1044 03/12/17 1502 03/12/17 2035 03/13/17 0435  BP: 136/85 104/85 126/83 117/80  Pulse: 86  84 69  Resp:   14 15  Temp:  98.4 F (36.9 C) 98.3 F (36.8 C) 97.9 F (36.6 C)  TempSrc:  Oral Oral Oral  SpO2:  100% 99% 99%  Weight:    188 lb 3.2 oz (85.4 kg)  Height:        Intake/Output Summary (Last 24 hours) at 03/13/17 0907 Last data filed at 03/12/17 2200  Gross per 24 hour  Intake              240 ml  Output              450 ml  Net             -210 ml   Filed Weights   03/11/17 0500 03/12/17 0648 03/13/17 0435  Weight: 187 lb 4.8 oz (85 kg) 187 lb 3.2 oz (84.9 kg) 188 lb 3.2 oz (85.4 kg)    Telemetry    NSr - Personally Reviewed  ECG    NSR, occasional brief sinus tachycardia - Personally Reviewed  Physical Exam  Relatively relaxed, comfortable. GEN: No acute distress.   Neck: No JVD Cardiac: RRR, no murmurs, rubs, or gallops.  Respiratory: Clear to auscultation bilaterally. GI:  Soft, nontender, non-distended  MS: No edema; No deformity. Neuro:  Nonfocal  Psych: Normal affect   Labs    Chemistry Recent Labs Lab 03/10/17 0400 03/10/17 1300 03/11/17 0131  NA 137  --  137  K 4.6  --  4.5  CL 104  --  104  CO2 27  --  28  GLUCOSE 105*  --  100*  BUN 21*  --  14  CREATININE 1.10 1.01 1.20  CALCIUM 9.6  --  9.0  PROT 6.4*  --   --   ALBUMIN 4.1  --   --   AST 20  --   --   ALT 27  --   --   ALKPHOS 69  --   --   BILITOT 0.4  --   --   GFRNONAA >60 >60 >60  GFRAA >60 >60 >60  ANIONGAP 6  --  5     Hematology Recent Labs Lab 03/10/17 0400 03/10/17 1300  WBC 9.6 8.3  RBC 4.58 4.83  HGB 13.6 14.6  HCT 42.1 44.3  MCV 91.9  91.7  MCH 29.7 30.2  MCHC 32.3 33.0  RDW 13.4 13.3  PLT 185 180    Cardiac Enzymes Recent Labs Lab 03/10/17 0400 03/10/17 1300 03/10/17 1836 03/11/17 0131  TROPONINI <0.03 <0.03 <0.03 <0.03   No results for input(s): TROPIPOC in the last 168 hours.   BNPNo results for input(s): BNP, PROBNP in the last 168 hours.   DDimer  Recent Labs Lab 03/10/17 0400  DDIMER <0.27     Radiology    No results found.  Cardiac Studies   03/11/2017 CATH  Angiographically very similar to images from January 2018  Ost LM lesion, 60 %stenosed.  Ost LAD to Prox LAD lesion, 75 %stenosed. Mid LAD lesion, 55 %stenosed.  LIMA graft was visualized by angiography and is normal in caliber and anatomically normal.  SVG-Diag1- Known to be occluded 100%  Ost Ramus lesion, 90 %stenosed. Ramus lesion, 75 %stenosed.  Ost Cx lesion, 80 %stenosed. Prox Cx to Mid Cx lesion, 50 %stenosed.  Seq SVG- Ramus & OM2 graft was visualized by angiography and is large. Prox Graft to Mid Graft lesion, 40 %stenosed. Mid Graft to Dist Graft lesion, 40 %stenosed.  Ost RCA to Prox RCA lesion, 50 %stenosed. Dist RCA lesion, 50 %stenosed. RPDA lesion, 40 %stenosed - at the insertion of SVG  SVG-rPDA: Origin to Prox Graft lesion, 70 %stenosed. The  lesion is identical to January 2018. The flow in the graft is reversed.  The left ventricular ejection fraction is 55-65% by visual estimate. The left ventricular systolic function is normal.  LV end diastolic pressure is normal.  Her take a few percentage points, the patient's angiographic images are almost identical to January 2018. No obvious culprit lesion to explain the patient's symptoms.  The SVG-RPDA does have stable proximal roughly 70% smooth lesion, but there is competitive flow in the PDA with brisk flow down the native PDA. The fact that this is similar to January 2018 would mean that the patient is not likely to be asymptomatic from this.  Would consider microvascular ischemia versus non-anginal chest pain. The patient has had several cardiac catheterizations for similar symptoms that have been roughly similar. Would recommend some type of noninvasive evaluation to have is a new baseline based upon these angiographic Images that can be used as a benchmark for future evaluations.  Patient Profile     61 y.o. male with extensive coronary artery disease, but with all major coronary territories well perfused and stable anatomy on multiple repeat cardiac catheterization procedures. Symptoms occur at rest, but respond to nitrates.  Assessment & Plan    Improved angina control on increased Ranexa and amlodipine. Anxiety is clearly a big problem and likely contributing to increased angina frequency. He has used sedatives in one form or another for as long as 40 years and is currently 2 years into a slow Librium weaning process. I suspect angina pectoris will be difficult to manage until anxiety is controlled. He probably has generalized anxiety disorder, but there are also features of PTSD. He may benefit from maintenance therapy with SSRI or similar drug, under the supervision of a specialist. He is seeing a Social worker. He is trying to perform yoga. Discussed non-pharmacological ways to  help cope with anxiety. DC home today.  Signed, Sanda Klein, MD  03/13/2017, 9:07 AM

## 2017-03-14 MED FILL — Nitroglycerin IV Soln 100 MCG/ML in D5W: INTRA_ARTERIAL | Qty: 10 | Status: AC

## 2017-03-16 ENCOUNTER — Ambulatory Visit (INDEPENDENT_AMBULATORY_CARE_PROVIDER_SITE_OTHER): Payer: BC Managed Care – PPO | Admitting: Licensed Clinical Social Worker

## 2017-03-16 DIAGNOSIS — F3341 Major depressive disorder, recurrent, in partial remission: Secondary | ICD-10-CM

## 2017-03-18 ENCOUNTER — Ambulatory Visit (INDEPENDENT_AMBULATORY_CARE_PROVIDER_SITE_OTHER): Payer: BC Managed Care – PPO | Admitting: Interventional Cardiology

## 2017-03-18 ENCOUNTER — Encounter: Payer: Self-pay | Admitting: Interventional Cardiology

## 2017-03-18 VITALS — BP 118/80 | HR 72 | Ht 69.0 in | Wt 187.4 lb

## 2017-03-18 DIAGNOSIS — I25118 Atherosclerotic heart disease of native coronary artery with other forms of angina pectoris: Secondary | ICD-10-CM | POA: Diagnosis not present

## 2017-03-18 DIAGNOSIS — Z951 Presence of aortocoronary bypass graft: Secondary | ICD-10-CM

## 2017-03-18 DIAGNOSIS — F419 Anxiety disorder, unspecified: Secondary | ICD-10-CM

## 2017-03-18 DIAGNOSIS — E785 Hyperlipidemia, unspecified: Secondary | ICD-10-CM

## 2017-03-18 NOTE — Progress Notes (Signed)
Cardiology Office Note   Date:  03/18/2017   ID:  [name], DOB 1956-01-04, MRN [id number]  PCP:  [name], MD    No chief complaint on file.  F/u CAD  Wt Readings from Last 3 Encounters:  03/18/17 187 lb 6.4 oz (85 kg)  03/13/17 188 lb 3.2 oz (85.4 kg)  01/18/17 191 lb 12.8 oz (87 kg)       History of Present Illness: [name] is a 61 y.o. male  with CAD, s/p anterior STEMI and emergent CABG in 2017.    He has had multiple episodes of chest pain since then and multiple caths.    He was last in the hospital a week ago, and had a cath that was unchanged on 03/11/17.  Concern for vasospasm.  Amlodipine was increased.  He required severeal doses of Morphine which helped the pain as well.  Since being at home: Denies : Chest pain. Dizziness. Leg edema. Nitroglycerin use. Orthopnea. Palpitations. Paroxysmal nocturnal dyspnea. Shortness of breath. Syncope.   The main topic of discussion was what to do in the future if his sx recur.  He admits to getting nervous when the pain comes on.       Past Medical History:  Diagnosis Date  . Acute hepatitis C without mention of hepatic coma(070.51)    Tx by [facility] for Hep.  negative for hep c 2/15.  Marland Kitchen Anxiety   . CAD (coronary artery disease)    a. 10/2015 ant STEMI >> LHC with 3 v CAD; oLAD tx with POBA >> emergent CABG.  . Chronic systolic CHF (congestive heart failure) ([place])   . Colitis   . Dyspnea   . Esophageal reflux    eosinophil esophagitis  . Former tobacco use   . Hypertension   . Ischemic cardiomyopathy    a. EF 25-30% at intraop TEE 4/17  //  b. Limited Echo 5/17 - EF 45-50%, mild ant HK  . Sinus bradycardia    a. HR dropping into 40s in 02/2016 -> BB reduced.  . Symptomatic hypotension    a. 02/2016 ER visit -> meds reduced.    Past Surgical History:  Procedure Laterality Date  . CARDIAC CATHETERIZATION N/A 11/28/2015   Procedure: Left Heart Cath and Coronary Angiography;  Surgeon: [name], MD;  Location: [facility];  Service: Cardiovascular;  Laterality: N/A;  . CARDIAC CATHETERIZATION N/A 11/28/2015   Procedure: Coronary Balloon Angioplasty;  Surgeon: [name], MD;  Location: [facility] [place];  Service: Cardiovascular;  Laterality: N/A;  ostial LAD  . CARDIAC CATHETERIZATION N/A 11/28/2015   Procedure: Coronary/Graft Angiography;  Surgeon: [name], MD;  Location: [facility];  Service: Cardiovascular;  Laterality: N/A;  coronaries only   . CARDIAC CATHETERIZATION N/A 04/21/2016   Procedure: Left Heart Cath and Coronary Angiography;  Surgeon: Wellington Hampshire, MD;  Location: [facility];  Service: Cardiovascular;  Laterality: N/A;  . CARDIAC CATHETERIZATION N/A 06/14/2016   Procedure: Left Heart Cath and Cors/Grafts Angiography;  Surgeon: Lorretta Harp, MD;  Location: [facility];  Service: Cardiovascular;  Laterality: N/A;  . CARDIAC CATHETERIZATION N/A 09/08/2016   Procedure: Left Heart Cath and Cors/Grafts Angiography;  Surgeon: Wellington Hampshire, MD;  Location: [facility];  Service: Cardiovascular;  Laterality: N/A;  . CORONARY ARTERY BYPASS GRAFT N/A 11/28/2015   Procedure: CORONARY ARTERY BYPASS GRAFTING (CABG) TIMES FIVE USING LEFT INTERNAL MAMMARY ARTERY AND  RIGHT GREATER SAPHENOUS,VIEN HARVEATED BY ENDOVIEN, INTRAOPPRATIVE TEE;  Surgeon: Gaye Pollack, MD;  Location: [facility];  Service: Open Heart Surgery;  Laterality: N/A;  . LEFT HEART CATH AND CORS/GRAFTS ANGIOGRAPHY N/A 03/11/2017   Procedure: Left Heart Cath and Cors/Grafts Angiography;  Surgeon: Leonie Man, MD;  Location: [facility];  Service: Cardiovascular;  Laterality: N/A;  . PERIPHERAL VASCULAR CATHETERIZATION N/A 06/14/2016   Procedure: Lower Extremity Angiography;  Surgeon: Lorretta Harp, MD;  Location: [facility];  Service: Cardiovascular;  Laterality: N/A;  . SHOULDER SURGERY       Current Outpatient Prescriptions    Medication Sig Dispense Refill  . amLODipine (NORVASC) 5 MG tablet Take 1.5 tablets (7.5 mg total) by mouth daily. 30 tablet 6  . aspirin 81 MG tablet Take 81 mg by mouth daily.    Marland Kitchen atorvastatin (LIPITOR) 80 MG tablet Take 1 tablet (80 mg total) by mouth daily. 30 tablet 2  . chlordiazePOXIDE (LIBRIUM) 10 MG capsule Take 10 mg by mouth daily as needed for anxiety.     . clopidogrel (PLAVIX) 75 MG tablet take 1 tablet by mouth once daily 30 tablet 11  . ezetimibe (ZETIA) 10 MG tablet Take 1 tablet (10 mg total) by mouth daily. 30 tablet 11  . FLUoxetine (PROZAC) 20 MG capsule Take 20 mg by mouth daily.  1  . lisinopril (PRINIVIL,ZESTRIL) 5 MG tablet Take 5 mg by mouth daily. May take 1 additional tablet once daily for SBP greater than 150 once hour after taking daily dose    . metoprolol tartrate (LOPRESSOR) 25 MG tablet Take 25 mg by mouth daily.    . Multiple Vitamins-Minerals (ONE-A-DAY MENS 50+ ADVANTAGE PO) Take 1 tablet by mouth daily.     . nitroGLYCERIN (NITROSTAT) 0.4 MG SL tablet Place 0.4 mg under the tongue every 5 (five) minutes as needed for chest pain. 3 DOSE MAX    . ranitidine (ZANTAC) 150 MG tablet Take 150 mg by mouth daily as needed for heartburn.     . ranolazine (RANEXA) 1000 MG SR tablet Take 1 tablet (1,000 mg total) by mouth 2 (two) times daily. 60 tablet 11  . traZODone (DESYREL) 50 MG tablet Take 50 mg by mouth at bedtime as needed for sleep.      No current facility-administered medications for this visit.     Allergies:   Prednisone; Tetanus toxoids; Wellbutrin [bupropion]; and Chantix [varenicline]    Social History:  The patient  reports that he quit smoking about 15 months ago. His smoking use included Cigarettes. He smoked 0.50 packs per day. He has never used smokeless tobacco. He reports that he does not drink alcohol or use drugs.   Family History:  The patient's family history includes Heart Problems in his father; Heart attack in his maternal  grandmother and paternal uncle; Heart attack (age of onset: 54) in his father; Hypertension in his brother; Lung cancer in his mother; Stroke in his father and maternal grandmother.    ROS:  Please see the history of present illness.   Otherwise, review of systems are positive for chest pain as above.   All other systems are reviewed and negative.    PHYSICAL EXAM: VS:  BP 118/80 (BP Location: Left Arm, Patient Position: Sitting, Cuff Size: Normal)   Pulse 72   Ht 5\' 9"  (1.753 m)   Wt 187 lb 6.4 oz (85 kg)   SpO2 98%   BMI 27.67 kg/m  ,  BMI Body mass index is 27.67 kg/m. GEN: Well nourished, well developed, in no acute distress  HEENT: normal  Neck: no JVD, carotid bruits, or masses Cardiac: RRR; no murmurs, rubs, or gallops,no edema ; 2+ left radial pulse Respiratory:  clear to auscultation bilaterally, normal work of breathing GI: soft, nontender, nondistended, + BS MS: no deformity or atrophy  Skin: warm and dry, no rash Neuro:  Strength and sensation are intact Psych: euthymic mood, full affect      Recent Labs: 06/14/2016: Magnesium 2.2 01/04/2017: B Natriuretic Peptide 110.6 03/10/2017: ALT 27; Hemoglobin 14.6; Platelets 180 03/11/2017: BUN 14; Creatinine, Ser 1.20; Potassium 4.5; Sodium 137   Lipid Panel    Component Value Date/Time   CHOL 91 01/05/2017 0124   CHOL 130 09/30/2016 0939   TRIG 90 01/05/2017 0124   HDL 37 (L) 01/05/2017 0124   HDL 55 09/30/2016 0939   CHOLHDL 2.5 01/05/2017 0124   VLDL 18 01/05/2017 0124   LDLCALC 36 01/05/2017 0124   LDLCALC 54 09/30/2016 0939     Other studies Reviewed: Additional studies/ records that were reviewed today with results demonstrating: *cath results personally reviewed.   ASSESSMENT AND PLAN:  1. CAD: Recurrent chest pain. Multiple cardiac caths over the course of 2018. I personally reviewed the films. His graft to the RCA does have some narrowing. It is not clear to me that this is buildup of debris in the  graft but perhaps, just the morphology of the vein that was used. Regardless, his proximal RCA would be amenable to stenting, and this would likely be a more durable long-term revascularization compared to the existing vein graft. He has flow into the native diagonal, in which the SVG to diagonal is occluded. He has both native and LIMA graft flow to the LAD. We spoke at length. In regards to his question about when to calm to the hospital, I explained him that he would have to use his judgment based on his symptoms. Certainly if he had symptoms that reminded him of his prior heart attack, there should be no question that he comes to the hospital. He has not had any symptoms approaching that level.  Continue amlodipine at higher dose to treat potential vasospasm. Continue Ranexa.  2.  Hyperlipidemia: Continue atorvastatin. 2018 lipid results reviewed and well controlled. 3. Anxiety: Currently taking Prozac and Librium.     Current medicines are reviewed at length with the patient today.  The patient concerns regarding his medicines were addressed.  The following changes have been made:  No change  Labs/ tests ordered today include:  No orders of the defined types were placed in this encounter.   Recommend 150 minutes/week of aerobic exercise Low fat, low carb, high fiber diet recommended  Disposition:   FU in 3 months   Signed, Larae Grooms, MD  03/18/2017 10:13 AM    [facility] [address], [place], [place]  [address] Phone: [phone]; Fax: [phone]

## 2017-03-18 NOTE — Patient Instructions (Addendum)
Medication Instructions:  Your physician recommends that you continue on your current medications as directed. Please refer to the Current Medication list given to you today.   Labwork: None ordered  Testing/Procedures: None ordered  Follow-Up: Your physician wants you to follow-up in: 3-4 months with Dr. Irish Lack.    Any Other Special Instructions Will Be Listed Below (If Applicable).     If you need a refill on your cardiac medications before your next appointment, please call your pharmacy.

## 2017-03-29 ENCOUNTER — Ambulatory Visit (INDEPENDENT_AMBULATORY_CARE_PROVIDER_SITE_OTHER): Payer: BC Managed Care – PPO | Admitting: Licensed Clinical Social Worker

## 2017-03-29 DIAGNOSIS — F3341 Major depressive disorder, recurrent, in partial remission: Secondary | ICD-10-CM | POA: Diagnosis not present

## 2017-04-05 ENCOUNTER — Ambulatory Visit: Payer: BC Managed Care – PPO | Admitting: Licensed Clinical Social Worker

## 2017-04-14 ENCOUNTER — Ambulatory Visit: Payer: BC Managed Care – PPO | Admitting: Licensed Clinical Social Worker

## 2017-04-20 ENCOUNTER — Ambulatory Visit: Payer: BC Managed Care – PPO | Admitting: Interventional Cardiology

## 2017-04-26 ENCOUNTER — Ambulatory Visit: Payer: Self-pay | Admitting: Licensed Clinical Social Worker

## 2017-04-27 ENCOUNTER — Ambulatory Visit (INDEPENDENT_AMBULATORY_CARE_PROVIDER_SITE_OTHER): Payer: BC Managed Care – PPO | Admitting: Licensed Clinical Social Worker

## 2017-04-27 ENCOUNTER — Ambulatory Visit: Payer: BC Managed Care – PPO | Admitting: Nurse Practitioner

## 2017-04-27 DIAGNOSIS — F3341 Major depressive disorder, recurrent, in partial remission: Secondary | ICD-10-CM

## 2017-05-04 ENCOUNTER — Telehealth: Payer: Self-pay | Admitting: Interventional Cardiology

## 2017-05-04 NOTE — Telephone Encounter (Signed)
New message    Pt is calling asking for a call back.   Pt c/o medication issue:  1. Name of Medication: metoprolol-pt states he knows that this medication the side effect is drowsiness   2. How are you currently taking this medication (dosage and times per day)? 25 mg  3. Are you having a reaction (difficulty breathing--STAT)? drowsy  4. What is your medication issue? Pt is calling to ask what medication could be causing him to be drowsy?

## 2017-05-04 NOTE — Telephone Encounter (Signed)
Left message for patient to call back  

## 2017-05-05 NOTE — Telephone Encounter (Signed)
Patient calling and states that he is taking metoprolol tartrate 25 mg QD. Patient states that he has started to become increasingly drowsy mid afternoon. He states that he knows metoprolol can cause drowsiness and fatigue. Patient denies having any other symptoms and states that he has not been overexerting himself. Patient states that he has been getting a good night's rest. Patient states that he takes trazodone prn at night to help with sleep but he has not taken it in a couple of weeks to see if that would help. Patient asking if Dr. Irish Lack has any other recommendations or if this is something that he has to live with.

## 2017-05-06 NOTE — Telephone Encounter (Signed)
Dr. Earlean Shawl states that he received a message from the patient that Dr. Irish Lack wanted to speak to him prior to performing an endoscopy. Dr. Earlean Shawl would like for Dr. Irish Lack to call him at [phone] or send him a staff message whenever he is free. He states that this is not urgent.

## 2017-05-06 NOTE — Telephone Encounter (Signed)
New message    Dr. Earlean Shawl is calling to talk to Dr. Irish Lack about this pt. Please call.

## 2017-05-11 NOTE — Telephone Encounter (Signed)
Spoke with Dr. Earlean Shawl.  OK to proceed with endoscopy.

## 2017-05-17 ENCOUNTER — Ambulatory Visit: Payer: Self-pay | Admitting: Licensed Clinical Social Worker

## 2017-05-20 ENCOUNTER — Ambulatory Visit: Payer: Self-pay | Admitting: Licensed Clinical Social Worker

## 2017-05-25 ENCOUNTER — Ambulatory Visit (INDEPENDENT_AMBULATORY_CARE_PROVIDER_SITE_OTHER): Payer: BC Managed Care – PPO | Admitting: Licensed Clinical Social Worker

## 2017-05-25 DIAGNOSIS — F3341 Major depressive disorder, recurrent, in partial remission: Secondary | ICD-10-CM | POA: Diagnosis not present

## 2017-05-26 ENCOUNTER — Telehealth: Payer: Self-pay | Admitting: Interventional Cardiology

## 2017-05-26 NOTE — Telephone Encounter (Signed)
Called and spoke to patient and made him aware that Dr. Irish Lack spoke to Dr. Earlean Shawl and said it was okay to proceed with endoscopy and that the patient may hold his ASA 7 days prior to procedure and that he may hold his plavix 5 days prior to procedure.

## 2017-05-26 NOTE — Telephone Encounter (Signed)
Information faxed to Dr. Liliane Channel office via EPIC.

## 2017-05-26 NOTE — Telephone Encounter (Signed)
°  Follow Up  Calling to follow up on clearance regarding medications to hold for upcoming colonoscopy and endoscopy scheduled for 10/9. Pt states initial request was sent over from his GI doctor. Please call.

## 2017-06-15 NOTE — Progress Notes (Deleted)
Cardiology Office Note   Date:  06/15/2017   ID:  [name], DOB [birth year], MRN [id number]  PCP:  [name], MD    No chief complaint on file.  CAD  Wt Readings from Last 3 Encounters:  03/18/17 187 lb 6.4 oz (85 kg)  03/13/17 188 lb 3.2 oz (85.4 kg)  01/18/17 191 lb 12.8 oz (87 kg)       History of Present Illness: [name] is a 61 y.o. male  *with CAD, s/p anterior STEMI and emergent CABG in 2017.    He has had multiple episodes of chest pain since then and multiple caths.    He had  A GI w/u which     Past Medical History:  Diagnosis Date  . Acute hepatitis C without mention of hepatic coma(070.51)    Tx by [facility] for Hep.  negative for hep c 2/15.  Marland Kitchen Anxiety   . CAD (coronary artery disease)    a. 10/2015 ant STEMI >> LHC with 3 v CAD; oLAD tx with POBA >> emergent CABG.  . Chronic systolic CHF (congestive heart failure) ([place])   . Colitis   . Dyspnea   . Esophageal reflux    eosinophil esophagitis  . Former tobacco use   . Hypertension   . Ischemic cardiomyopathy    a. EF 25-30% at intraop TEE 4/17  //  b. Limited Echo 5/17 - EF 45-50%, mild ant HK  . Sinus bradycardia    a. HR dropping into 40s in 02/2016 -> BB reduced.  . Symptomatic hypotension    a. 02/2016 ER visit -> meds reduced.    Past Surgical History:  Procedure Laterality Date  . CARDIAC CATHETERIZATION N/A 11/28/2015   Procedure: Left Heart Cath and Coronary Angiography;  Surgeon: [name], MD;  Location: [facility];  Service: Cardiovascular;  Laterality: N/A;  . CARDIAC CATHETERIZATION N/A 11/28/2015   Procedure: Coronary Balloon Angioplasty;  Surgeon: [name], MD;  Location: [facility];  Service: Cardiovascular;  Laterality: N/A;  ostial LAD  . CARDIAC CATHETERIZATION N/A 11/28/2015   Procedure: Coronary/Graft Angiography;  Surgeon: [name], MD;  Location: [facility];  Service: Cardiovascular;  Laterality: N/A;   coronaries only   . CARDIAC CATHETERIZATION N/A 04/21/2016   Procedure: Left Heart Cath and Coronary Angiography;  Surgeon: Wellington Hampshire, MD;  Location: [facility];  Service: Cardiovascular;  Laterality: N/A;  . CARDIAC CATHETERIZATION N/A 06/14/2016   Procedure: Left Heart Cath and Cors/Grafts Angiography;  Surgeon: Lorretta Harp, MD;  Location: [facility];  Service: Cardiovascular;  Laterality: N/A;  . CARDIAC CATHETERIZATION N/A 09/08/2016   Procedure: Left Heart Cath and Cors/Grafts Angiography;  Surgeon: Wellington Hampshire, MD;  Location: [facility];  Service: Cardiovascular;  Laterality: N/A;  . CORONARY ARTERY BYPASS GRAFT N/A 11/28/2015   Procedure: CORONARY ARTERY BYPASS GRAFTING (CABG) TIMES FIVE USING LEFT INTERNAL MAMMARY ARTERY AND RIGHT GREATER SAPHENOUS,VIEN HARVEATED BY ENDOVIEN, INTRAOPPRATIVE TEE;  Surgeon: Gaye Pollack, MD;  Location: [facility];  Service: Open Heart Surgery;  Laterality: N/A;  . LEFT HEART CATH AND CORS/GRAFTS ANGIOGRAPHY N/A 03/11/2017   Procedure: Left Heart Cath and Cors/Grafts Angiography;  Surgeon: Leonie Man, MD;  Location: [facility];  Service: Cardiovascular;  Laterality: N/A;  . PERIPHERAL VASCULAR CATHETERIZATION N/A 06/14/2016   Procedure: Lower Extremity Angiography;  Surgeon: Lorretta Harp, MD;  Location: [facility];  Service: Cardiovascular;  Laterality: N/A;  . SHOULDER SURGERY       Current Outpatient Prescriptions  Medication Sig Dispense Refill  . amLODipine (NORVASC) 5 MG tablet Take 1.5 tablets (7.5 mg total) by mouth daily. 30 tablet 6  . aspirin 81 MG tablet Take 81 mg by mouth daily.    Marland Kitchen atorvastatin (LIPITOR) 80 MG tablet Take 1 tablet (80 mg total) by mouth daily. 30 tablet 2  . chlordiazePOXIDE (LIBRIUM) 10 MG capsule Take 10 mg by mouth daily as needed for anxiety.     . clopidogrel (PLAVIX) 75 MG tablet take 1 tablet by mouth once daily 30 tablet 11  . ezetimibe (ZETIA) 10 MG  tablet Take 1 tablet (10 mg total) by mouth daily. 30 tablet 11  . FLUoxetine (PROZAC) 20 MG capsule Take 20 mg by mouth daily.  1  . lisinopril (PRINIVIL,ZESTRIL) 5 MG tablet Take 5 mg by mouth daily. May take 1 additional tablet once daily for SBP greater than 150 once hour after taking daily dose    . metoprolol tartrate (LOPRESSOR) 25 MG tablet Take 25 mg by mouth daily.    . Multiple Vitamins-Minerals (ONE-A-DAY MENS 50+ ADVANTAGE PO) Take 1 tablet by mouth daily.     . nitroGLYCERIN (NITROSTAT) 0.4 MG SL tablet Place 0.4 mg under the tongue every 5 (five) minutes as needed for chest pain. 3 DOSE MAX    . ranitidine (ZANTAC) 150 MG tablet Take 150 mg by mouth daily as needed for heartburn.     . ranolazine (RANEXA) 1000 MG SR tablet Take 1 tablet (1,000 mg total) by mouth 2 (two) times daily. 60 tablet 11  . traZODone (DESYREL) 50 MG tablet Take 50 mg by mouth at bedtime as needed for sleep.      No current facility-administered medications for this visit.     Allergies:   Prednisone; Tetanus toxoids; Wellbutrin [bupropion]; and Chantix [varenicline]    Social History:  The patient  reports that he quit smoking about 18 months ago. His smoking use included Cigarettes. He smoked 0.50 packs per day. He has never used smokeless tobacco. He reports that he does not drink alcohol or use drugs.   Family History:  The patient's ***family history includes Heart Problems in his father; Heart attack in his maternal grandmother and paternal uncle; Heart attack (age of onset: 23) in his father; Hypertension in his brother; Lung cancer in his mother; Stroke in his father and maternal grandmother.    ROS:  Please see the history of present illness.   Otherwise, review of systems are positive for ***.   All other systems are reviewed and negative.    PHYSICAL EXAM: VS:  There were no vitals taken for this visit. , BMI There is no height or weight on file to calculate BMI. GEN: Well nourished, well  developed, in no acute distress HEENT: normal Neck: no JVD, carotid bruits, or masses Cardiac: ***RRR; no murmurs, rubs, or gallops,no edema  Respiratory:  clear to auscultation bilaterally, normal work of breathing GI: soft, nontender, nondistended, + BS MS: no deformity or atrophy Skin: warm and dry, no rash Neuro:  Strength and sensation are intact Psych: euthymic mood, full affect   EKG:   The ekg ordered today demonstrates ***   Recent Labs: 01/04/2017: B Natriuretic Peptide 110.6 03/10/2017: ALT 27; Hemoglobin 14.6; Platelets 180 03/11/2017: BUN 14; Creatinine, Ser 1.20; Potassium 4.5; Sodium 137   Lipid Panel    Component Value Date/Time  CHOL 91 01/05/2017 0124   CHOL 130 09/30/2016 0939   TRIG 90 01/05/2017 0124   HDL 37 (L) 01/05/2017 0124   HDL 55 09/30/2016 0939   CHOLHDL 2.5 01/05/2017 0124   VLDL 18 01/05/2017 0124   LDLCALC 36 01/05/2017 0124   LDLCALC 54 09/30/2016 0939     Other studies Reviewed: Additional studies/ records that were reviewed today with results demonstrating: ***.   ASSESSMENT AND PLAN:  1. CAD  2. Hyperlipidemia: 3. Anxiety:   Current medicines are reviewed at length with the patient today.  The patient concerns regarding his medicines were addressed.  The following changes have been made:  No change***  Labs/ tests ordered today include: *** No orders of the defined types were placed in this encounter.   Recommend 150 minutes/week of aerobic exercise Low fat, low carb, high fiber diet recommended  Disposition:   FU in ***   Signed, Larae Grooms, MD  06/15/2017 11:18 PM    [facility] [place], [place], [place]  [address] Phone: [phone]; Fax: [phone]

## 2017-06-16 ENCOUNTER — Ambulatory Visit: Payer: BC Managed Care – PPO | Admitting: Interventional Cardiology

## 2017-06-21 ENCOUNTER — Telehealth: Payer: Self-pay | Admitting: Interventional Cardiology

## 2017-06-21 ENCOUNTER — Encounter (HOSPITAL_COMMUNITY): Payer: Self-pay | Admitting: Emergency Medicine

## 2017-06-21 ENCOUNTER — Emergency Department (HOSPITAL_COMMUNITY): Payer: BC Managed Care – PPO

## 2017-06-21 ENCOUNTER — Observation Stay (HOSPITAL_COMMUNITY)
Admission: EM | Admit: 2017-06-21 | Discharge: 2017-06-22 | Disposition: A | Discharge status: Home / Self Care | Payer: BC Managed Care – PPO | Attending: Cardiology | Admitting: Cardiology

## 2017-06-21 DIAGNOSIS — I739 Peripheral vascular disease, unspecified: Secondary | ICD-10-CM | POA: Insufficient documentation

## 2017-06-21 DIAGNOSIS — R0789 Other chest pain: Secondary | ICD-10-CM

## 2017-06-21 DIAGNOSIS — R079 Chest pain, unspecified: Secondary | ICD-10-CM | POA: Diagnosis not present

## 2017-06-21 DIAGNOSIS — I200 Unstable angina: Secondary | ICD-10-CM

## 2017-06-21 DIAGNOSIS — Z87891 Personal history of nicotine dependence: Secondary | ICD-10-CM | POA: Diagnosis not present

## 2017-06-21 DIAGNOSIS — I10 Essential (primary) hypertension: Secondary | ICD-10-CM | POA: Diagnosis not present

## 2017-06-21 DIAGNOSIS — I25110 Atherosclerotic heart disease of native coronary artery with unstable angina pectoris: Secondary | ICD-10-CM | POA: Diagnosis not present

## 2017-06-21 DIAGNOSIS — R110 Nausea: Secondary | ICD-10-CM | POA: Diagnosis present

## 2017-06-21 DIAGNOSIS — Z951 Presence of aortocoronary bypass graft: Secondary | ICD-10-CM | POA: Insufficient documentation

## 2017-06-21 DIAGNOSIS — E7800 Pure hypercholesterolemia, unspecified: Secondary | ICD-10-CM | POA: Diagnosis not present

## 2017-06-21 DIAGNOSIS — E785 Hyperlipidemia, unspecified: Secondary | ICD-10-CM | POA: Diagnosis not present

## 2017-06-21 DIAGNOSIS — D649 Anemia, unspecified: Secondary | ICD-10-CM | POA: Diagnosis not present

## 2017-06-21 DIAGNOSIS — F3160 Bipolar disorder, current episode mixed, unspecified: Secondary | ICD-10-CM | POA: Insufficient documentation

## 2017-06-21 DIAGNOSIS — I255 Ischemic cardiomyopathy: Secondary | ICD-10-CM | POA: Diagnosis not present

## 2017-06-21 DIAGNOSIS — I25119 Atherosclerotic heart disease of native coronary artery with unspecified angina pectoris: Secondary | ICD-10-CM | POA: Diagnosis not present

## 2017-06-21 DIAGNOSIS — I5022 Chronic systolic (congestive) heart failure: Secondary | ICD-10-CM | POA: Diagnosis not present

## 2017-06-21 DIAGNOSIS — Z79899 Other long term (current) drug therapy: Secondary | ICD-10-CM | POA: Diagnosis not present

## 2017-06-21 DIAGNOSIS — Z7902 Long term (current) use of antithrombotics/antiplatelets: Secondary | ICD-10-CM | POA: Diagnosis not present

## 2017-06-21 DIAGNOSIS — Z8619 Personal history of other infectious and parasitic diseases: Secondary | ICD-10-CM | POA: Insufficient documentation

## 2017-06-21 DIAGNOSIS — R001 Bradycardia, unspecified: Secondary | ICD-10-CM | POA: Insufficient documentation

## 2017-06-21 DIAGNOSIS — Z7982 Long term (current) use of aspirin: Secondary | ICD-10-CM | POA: Insufficient documentation

## 2017-06-21 DIAGNOSIS — M797 Fibromyalgia: Secondary | ICD-10-CM | POA: Diagnosis not present

## 2017-06-21 DIAGNOSIS — I110 Hypertensive heart disease with heart failure: Secondary | ICD-10-CM | POA: Diagnosis not present

## 2017-06-21 DIAGNOSIS — Z887 Allergy status to serum and vaccine status: Secondary | ICD-10-CM | POA: Insufficient documentation

## 2017-06-21 DIAGNOSIS — Z888 Allergy status to other drugs, medicaments and biological substances status: Secondary | ICD-10-CM | POA: Diagnosis not present

## 2017-06-21 DIAGNOSIS — F419 Anxiety disorder, unspecified: Secondary | ICD-10-CM | POA: Diagnosis not present

## 2017-06-21 DIAGNOSIS — R42 Dizziness and giddiness: Secondary | ICD-10-CM

## 2017-06-21 DIAGNOSIS — K219 Gastro-esophageal reflux disease without esophagitis: Secondary | ICD-10-CM | POA: Diagnosis not present

## 2017-06-21 DIAGNOSIS — Z8249 Family history of ischemic heart disease and other diseases of the circulatory system: Secondary | ICD-10-CM | POA: Insufficient documentation

## 2017-06-21 DIAGNOSIS — I252 Old myocardial infarction: Secondary | ICD-10-CM | POA: Insufficient documentation

## 2017-06-21 HISTORY — DX: Cerebral infarction, unspecified: I63.9

## 2017-06-21 HISTORY — DX: Basal cell carcinoma of skin of other parts of face: C44.319

## 2017-06-21 HISTORY — DX: Migraine, unspecified, not intractable, without status migrainosus: G43.909

## 2017-06-21 HISTORY — DX: Gout, unspecified: M10.9

## 2017-06-21 HISTORY — DX: Family history of other specified conditions: Z84.89

## 2017-06-21 HISTORY — DX: Unspecified viral hepatitis C without hepatic coma: B19.20

## 2017-06-21 HISTORY — DX: Acute myocardial infarction, unspecified: I21.9

## 2017-06-21 HISTORY — DX: Pure hypercholesterolemia, unspecified: E78.00

## 2017-06-21 LAB — BASIC METABOLIC PANEL
Anion gap: 9 (ref 5–15)
BUN: 19 mg/dL (ref 6–20)
CO2: 21 mmol/L — ABNORMAL LOW (ref 22–32)
Calcium: 9.1 mg/dL (ref 8.9–10.3)
Chloride: 108 mmol/L (ref 101–111)
Creatinine, Ser: 1.08 mg/dL (ref 0.61–1.24)
GFR calc Af Amer: >60 mL/min (ref >60)
GFR calc non Af Amer: >60 mL/min (ref >60)
Glucose, Bld: 89 mg/dL (ref 65–99)
Potassium: 4.4 mmol/L (ref 3.5–5.1)
Sodium: 138 mmol/L (ref 135–145)

## 2017-06-21 LAB — CBC
HCT: 37.4 % — ABNORMAL LOW (ref 39.0–52.0)
HCT: 40.6 % (ref 39.0–52.0)
Hemoglobin: 12.3 g/dL — ABNORMAL LOW (ref 13.0–17.0)
Hemoglobin: 13.8 g/dL (ref 13.0–17.0)
MCH: 30.1 pg (ref 26.0–34.0)
MCH: 31.4 pg (ref 26.0–34.0)
MCHC: 32.9 g/dL (ref 30.0–36.0)
MCHC: 34 g/dL (ref 30.0–36.0)
MCV: 91.7 fL (ref 78.0–100.0)
MCV: 92.5 fL (ref 78.0–100.0)
Platelets: 164 10*3/uL (ref 150–400)
Platelets: 196 10*3/uL (ref 150–400)
RBC: 4.08 MIL/uL — ABNORMAL LOW (ref 4.22–5.81)
RBC: 4.39 MIL/uL (ref 4.22–5.81)
RDW: 13.1 % (ref 11.5–15.5)
RDW: 13.6 % (ref 11.5–15.5)
WBC: 6.8 10*3/uL (ref 4.0–10.5)
WBC: 9.1 10*3/uL (ref 4.0–10.5)

## 2017-06-21 LAB — MAGNESIUM: Magnesium: 2.1 mg/dL (ref 1.7–2.4)

## 2017-06-21 LAB — CREATININE, SERUM
Creatinine, Ser: 1.07 mg/dL (ref 0.61–1.24)
GFR calc Af Amer: >60 mL/min (ref >60)
GFR calc non Af Amer: >60 mL/min (ref >60)

## 2017-06-21 LAB — I-STAT TROPONIN, ED: Troponin i, poc: 0 ng/mL (ref 0.00–0.08)

## 2017-06-21 LAB — TSH: TSH: 0.817 u[IU]/mL (ref 0.350–4.500)

## 2017-06-21 LAB — TROPONIN I
Troponin I: <0.03 ng/mL (ref <0.03)
Troponin I: <0.03 ng/mL (ref <0.03)

## 2017-06-21 MED ORDER — EZETIMIBE 10 MG PO TABS
10.0000 mg | ORAL_TABLET | Freq: Every day | ORAL | Status: DC
  Administered 2017-06-21 – 2017-06-22 (×2): 10 mg via ORAL
  Filled 2017-06-21 (×2): qty 1

## 2017-06-21 MED ORDER — FAMOTIDINE 20 MG PO TABS: 20.0000 mg | ORAL_TABLET | Freq: Two times a day (BID) | ORAL | Status: DC | PRN

## 2017-06-21 MED ORDER — FLUOXETINE HCL 20 MG PO CAPS
40.0000 mg | ORAL_CAPSULE | Freq: Every day | ORAL | Status: DC
  Administered 2017-06-21 – 2017-06-22 (×2): 40 mg via ORAL
  Filled 2017-06-21 (×2): qty 2

## 2017-06-21 MED ORDER — AMLODIPINE BESYLATE 5 MG PO TABS
7.5000 mg | ORAL_TABLET | Freq: Every day | ORAL | Status: DC
  Administered 2017-06-21 – 2017-06-22 (×2): 7.5 mg via ORAL
  Filled 2017-06-21 (×2): qty 2

## 2017-06-21 MED ORDER — CHLORDIAZEPOXIDE HCL 5 MG PO CAPS: 10.0000 mg | ORAL_CAPSULE | Freq: Every day | ORAL | Status: DC | PRN

## 2017-06-21 MED ORDER — RANOLAZINE ER 500 MG PO TB12
1000.0000 mg | ORAL_TABLET | Freq: Two times a day (BID) | ORAL | Status: DC
  Administered 2017-06-21 – 2017-06-22 (×2): 1000 mg via ORAL
  Filled 2017-06-21 (×2): qty 2

## 2017-06-21 MED ORDER — METOPROLOL TARTRATE 25 MG PO TABS
25.0000 mg | ORAL_TABLET | ORAL | Status: DC
  Administered 2017-06-22: 25 mg via ORAL
  Filled 2017-06-21: qty 1

## 2017-06-21 MED ORDER — ZOLPIDEM TARTRATE 5 MG PO TABS
5.0000 mg | ORAL_TABLET | Freq: Every evening | ORAL | Status: DC | PRN
  Administered 2017-06-21: 5 mg via ORAL
  Filled 2017-06-21: qty 1

## 2017-06-21 MED ORDER — ADULT MULTIVITAMIN W/MINERALS CH
ORAL_TABLET | Freq: Every day | ORAL | Status: DC
Start: 2017-06-21 — End: 2017-06-22
  Administered 2017-06-21 – 2017-06-22 (×2): 1 via ORAL
  Filled 2017-06-21 (×2): qty 1

## 2017-06-21 MED ORDER — MORPHINE SULFATE (PF) 4 MG/ML IV SOLN
4.0000 mg | Freq: Once | INTRAVENOUS | Status: AC
  Administered 2017-06-21: 4 mg via INTRAVENOUS
  Filled 2017-06-21: qty 1

## 2017-06-21 MED ORDER — HEPARIN SODIUM (PORCINE) 5000 UNIT/ML IJ SOLN
5000.0000 [IU] | Freq: Three times a day (TID) | INTRAMUSCULAR | Status: DC
  Administered 2017-06-21 – 2017-06-22 (×3): 5000 [IU] via SUBCUTANEOUS
  Filled 2017-06-21 (×3): qty 1

## 2017-06-21 MED ORDER — ACETAMINOPHEN 325 MG PO TABS: 650.0000 mg | ORAL_TABLET | ORAL | Status: DC | PRN

## 2017-06-21 MED ORDER — ONDANSETRON HCL 4 MG/2ML IJ SOLN: 4.0000 mg | Freq: Four times a day (QID) | INTRAMUSCULAR | Status: DC | PRN

## 2017-06-21 MED ORDER — MORPHINE SULFATE (PF) 4 MG/ML IV SOLN
INTRAVENOUS | Status: AC
  Administered 2017-06-21: 4 mg
  Filled 2017-06-21: qty 1

## 2017-06-21 MED ORDER — ASPIRIN 300 MG RE SUPP: 300.0000 mg | RECTAL | Status: AC

## 2017-06-21 MED ORDER — NITROGLYCERIN 0.4 MG SL SUBL: 0.4000 mg | SUBLINGUAL_TABLET | SUBLINGUAL | Status: DC | PRN

## 2017-06-21 MED ORDER — ASPIRIN EC 81 MG PO TBEC
81.0000 mg | DELAYED_RELEASE_TABLET | Freq: Every day | ORAL | Status: DC
  Administered 2017-06-22: 81 mg via ORAL
  Filled 2017-06-21: qty 1

## 2017-06-21 MED ORDER — NITROGLYCERIN 2 % TD OINT
1.0000 [in_us] | TOPICAL_OINTMENT | Freq: Four times a day (QID) | TRANSDERMAL | Status: DC
Start: 2017-06-21 — End: 2017-06-22
  Administered 2017-06-22 (×3): 1 [in_us] via TOPICAL
  Filled 2017-06-21: qty 30

## 2017-06-21 MED ORDER — ASPIRIN 81 MG PO CHEW: 324.0000 mg | CHEWABLE_TABLET | ORAL | Status: AC

## 2017-06-21 MED ORDER — ATORVASTATIN CALCIUM 80 MG PO TABS
80.0000 mg | ORAL_TABLET | Freq: Every day | ORAL | Status: DC
  Administered 2017-06-22: 80 mg via ORAL
  Filled 2017-06-21: qty 1

## 2017-06-21 MED ORDER — CLOPIDOGREL BISULFATE 75 MG PO TABS
75.0000 mg | ORAL_TABLET | Freq: Every day | ORAL | Status: DC
  Administered 2017-06-22: 75 mg via ORAL
  Filled 2017-06-21: qty 1

## 2017-06-21 MED ORDER — ASPIRIN 81 MG PO TABS: 81.0000 mg | ORAL_TABLET | Freq: Every day | ORAL | Status: DC

## 2017-06-21 MED ORDER — TRAZODONE HCL 50 MG PO TABS
50.0000 mg | ORAL_TABLET | Freq: Every evening | ORAL | Status: DC | PRN
  Administered 2017-06-21: 50 mg via ORAL
  Filled 2017-06-21: qty 1

## 2017-06-21 MED ORDER — MORPHINE SULFATE (PF) 2 MG/ML IV SOLN
2.0000 mg | INTRAVENOUS | Status: DC | PRN
  Administered 2017-06-21 – 2017-06-22 (×3): 2 mg via INTRAVENOUS
  Filled 2017-06-21 (×4): qty 1

## 2017-06-21 MED ORDER — MORPHINE SULFATE (PF) 4 MG/ML IV SOLN: 4.0000 mg | Freq: Once | INTRAVENOUS | Status: AC

## 2017-06-21 MED ORDER — FLUTICASONE PROPIONATE 50 MCG/ACT NA SUSP
1.0000 | Freq: Every day | NASAL | Status: DC
  Administered 2017-06-22: 1 via NASAL
  Filled 2017-06-21: qty 16

## 2017-06-21 MED ORDER — GI COCKTAIL ~~LOC~~
30.0000 mL | Freq: Once | ORAL | Status: AC
  Administered 2017-06-21: 30 mL via ORAL
  Filled 2017-06-21: qty 30

## 2017-06-21 MED ORDER — PANTOPRAZOLE SODIUM 40 MG PO TBEC
40.0000 mg | DELAYED_RELEASE_TABLET | Freq: Every day | ORAL | Status: DC
  Administered 2017-06-21 – 2017-06-22 (×2): 40 mg via ORAL
  Filled 2017-06-21 (×2): qty 1

## 2017-06-21 MED ORDER — LISINOPRIL 5 MG PO TABS
5.0000 mg | ORAL_TABLET | Freq: Every day | ORAL | Status: DC
  Administered 2017-06-21 – 2017-06-22 (×2): 5 mg via ORAL
  Filled 2017-06-21 (×2): qty 1

## 2017-06-21 MED ORDER — NITROGLYCERIN 0.4 MG SL SUBL
0.4000 mg | SUBLINGUAL_TABLET | SUBLINGUAL | Status: DC | PRN
  Administered 2017-06-21 – 2017-06-22 (×6): 0.4 mg via SUBLINGUAL
  Filled 2017-06-21 (×2): qty 1

## 2017-06-21 NOTE — ED Provider Notes (Signed)
[facility] DEPARTMENT Provider Note   CSN: [id number] Arrival date & time: 06/21/17  8841     History   Chief Complaint Chief Complaint  Patient presents with  . Chest Pain    HPI [name] is a 61 y.o. 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 Patient states that when he awoke around 5:30 AM he had some left hand and arm paresthesias as well as paresthesias in his right fingertips, which seemed to improve with time, however at 8:30 AM he began having sudden onset chest pain. He describes the pain as 8/10 constant sharp and pressure-like pain in the center of his chest, radiating somewhat into the left lower chest, with no known aggravating factors, unchanged with exertion or inspiration, and minimally improved with 5 nitroglycerin and 324 mg aspirin. He states that the NTG helped minimally for a few minutes before wearing off and no longer helping at all. He had associated lightheadedness, nausea, diaphoresis, and "a little bit" of shortness of breath. He states that he noticed when he walked upstairs that he was more winded than he normally is (usually not winded from that activity), however he denies that this worsened the chest pain that he was experiencing. He states that since his MI, he had had experienced intermittent chest pain every couple weeks that usually improved with nitroglycerin, however since July 2018 he had not had any chest pain. July 2018 was his last admission for chest pain, during which time he underwent a cardiac cath which was essentially unchanged compared to the January 2018 cath. He has been compliant with all of his medications. His cardiologist is Dr. Irish Lack at [facility]. He is a  former smoker who quit several months ago. He has a +FHx of MI in his father at age 66 (survived) and PUncle in his late 46s-early 70s (died of MI). He just had an endoscopy/colonoscopy last week, which reportedly was unremarkable and had no evidence of eosinophilic esophagitis.   He denies fevers, chills, cough, LE swelling, recent travel/surgery/immobilization, personal/family hx of DVT/PE, abd pain, V/D/C, hematuria, dysuria, myalgias, arthralgias, claudication, orthopnea, numbness, focal weakness, or any other complaints at this time.    The history is provided by the patient and medical records. No language interpreter was used.  Chest Pain   This is a new problem. The current episode started 1 to 2 hours ago. The problem occurs constantly. The problem has not changed since onset.The pain is associated with rest. The pain is present in the substernal region. The pain is at a severity of 8/10. The pain is moderate. The quality of the pain is described as pressure-like and sharp. Radiates to: to L chest. Duration of episode(s) is 2 hours. Associated symptoms include diaphoresis, nausea and shortness of breath ("a little bit"). Pertinent negatives include no abdominal pain, no claudication, no cough, no fever, no lower extremity edema, no numbness, no vomiting and no weakness. He has tried nitroglycerin (and 324mg  ASA) for the symptoms. The treatment provided mild relief. Risk factors include male gender.  His past medical history is significant for CAD, hyperlipidemia, hypertension, MI and strokes.  Pertinent negatives for past medical history include no DVT and no PE.  His family medical history is  significant for CAD.  Pertinent negatives for family medical history include: no PE.  Procedure history is positive for cardiac catheterization, echocardiogram and exercise treadmill test.    Past Medical History:  Diagnosis Date  . Acute hepatitis C without mention of hepatic coma(070.51)    Tx by  [facility] for Hep.  negative for hep c 2/15.  Marland Kitchen Anxiety   . CAD (coronary artery disease)    a. 10/2015 ant STEMI >> LHC with 3 v CAD; oLAD tx with POBA >> emergent CABG.  . Chronic systolic CHF (congestive heart failure) ([place])   . Colitis   . Dyspnea   . Esophageal reflux    eosinophil esophagitis  . Former tobacco use   . Hypertension   . Ischemic cardiomyopathy    a. EF 25-30% at intraop TEE 4/17  //  b. Limited Echo 5/17 - EF 45-50%, mild ant HK  . Sinus bradycardia    a. HR dropping into 40s in 02/2016 -> BB reduced.  . Symptomatic hypotension    a. 02/2016 ER visit -> meds reduced.    Patient Active Problem List   Diagnosis Date Noted  . Chest pain 03/10/2017  . Family hx-stroke 11/10/2016  . Stroke-like episode ([place]) - R brain, s/p tPA 11/09/2016  . Unstable angina ([place]) 09/07/2016  . Claudication of both lower extremities ([place])   . Pure hypercholesterolemia   . Tobacco abuse disorder   . Coronary artery disease involving coronary bypass graft of native heart with unstable angina pectoris ([place])   . Chest pain at rest 06/10/2016  . Abnormal nuclear stress test - HIGH RISK 04/20/2016  . Old MI (myocardial infarction)   . Essential hypertension 02/26/2016  . Ischemic cardiomyopathy 12/25/2015  . Dyslipidemia, goal LDL below 70 12/25/2015  . STEMI (ST elevation myocardial infarction) ([place]) 11/28/2015  . 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 Coronary artery disease involving native coronary artery of native heart with angina pectoris ([place]) 11/28/2015  . 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 Acute MI anterior wall first episode care [facility])   . Chest pain with high risk for cardiac etiology 03/07/2015  . Dysphagia 03/07/2015  . Gout attack 03/07/2015  . Mixed bipolar I disorder ([place]) 03/07/2015  . Fibromyalgia 07/09/2014  . Gout 07/09/2014  . Anxiety 07/09/2014  . Depression 07/09/2014  . Hepatitis C 11/20/2012  . Eosinophilic esophagitis 58/52/7782    Past Surgical  History:  Procedure Laterality Date  . CARDIAC CATHETERIZATION N/A 11/28/2015   Procedure: Left Heart Cath and Coronary Angiography;  Surgeon: [name], MD;  Location: [facility] CV LAB;  Service: Cardiovascular;  Laterality: N/A;  . CARDIAC CATHETERIZATION N/A 11/28/2015   Procedure: Coronary Balloon Angioplasty;  Surgeon: [name], MD;  Location: [facility];  Service: Cardiovascular;  Laterality: N/A;  ostial LAD  . CARDIAC CATHETERIZATION N/A 11/28/2015   Procedure: Coronary/Graft Angiography;  Surgeon: [name], MD;  Location: [facility] LAB;  Service: Cardiovascular;  Laterality: N/A;  coronaries only   . CARDIAC CATHETERIZATION N/A 04/21/2016   Procedure: Left Heart Cath and Coronary Angiography;  Surgeon: Wellington Hampshire, MD;  Location: [facility] CV LAB;  Service: Cardiovascular;  Laterality: N/A;  . CARDIAC CATHETERIZATION N/A 06/14/2016   Procedure: Left Heart Cath and Cors/Grafts Angiography;  Surgeon: Lorretta Harp, MD;  Location: [facility] CV LAB;  Service: Cardiovascular;  Laterality: N/A;  . CARDIAC CATHETERIZATION N/A 09/08/2016   Procedure: Left Heart Cath and  Cors/Grafts Angiography;  Surgeon: Wellington Hampshire, MD;  Location: [facility] LAB;  Service: Cardiovascular;  Laterality: N/A;  . CORONARY ARTERY BYPASS GRAFT N/A 11/28/2015   Procedure: CORONARY ARTERY BYPASS GRAFTING (CABG) TIMES FIVE USING LEFT INTERNAL MAMMARY ARTERY AND RIGHT GREATER SAPHENOUS,VIEN HARVEATED BY ENDOVIEN, INTRAOPPRATIVE TEE;  Surgeon: Gaye Pollack, MD;  Location: [facility];  Service: Open Heart Surgery;  Laterality: N/A;  . LEFT HEART CATH AND CORS/GRAFTS ANGIOGRAPHY N/A 03/11/2017   Procedure: Left Heart Cath and Cors/Grafts Angiography;  Surgeon: Leonie Man, MD;  Location: [facility] CV LAB;  Service: Cardiovascular;  Laterality: N/A;  . PERIPHERAL VASCULAR CATHETERIZATION N/A 06/14/2016   Procedure: Lower Extremity Angiography;  Surgeon:  Lorretta Harp, MD;  Location: [facility] CV LAB;  Service: Cardiovascular;  Laterality: N/A;  . SHOULDER SURGERY         Home Medications    Prior to Admission medications   Medication Sig Start Date End Date Taking? Authorizing Provider  amLODipine (NORVASC) 5 MG tablet Take 1.5 tablets (7.5 mg total) by mouth daily. 03/13/17   Rise Mu, PA-C  aspirin 81 MG tablet Take 81 mg by mouth daily.    [provider]  atorvastatin (LIPITOR) 80 MG tablet Take 1 tablet (80 mg total) by mouth daily. 11/10/16 03/18/17  Donzetta Starch, NP  chlordiazePOXIDE (LIBRIUM) 10 MG capsule Take 10 mg by mouth daily as needed for anxiety.     [provider]  clopidogrel (PLAVIX) 75 MG tablet take 1 tablet by mouth once daily 01/25/17   [name] T, PA-C  ezetimibe (ZETIA) 10 MG tablet Take 1 tablet (10 mg total) by mouth daily. 08/12/16 03/18/17  [name], MD  FLUoxetine (PROZAC) 20 MG capsule Take 20 mg by mouth daily. 03/15/17   [provider]  lisinopril (PRINIVIL,ZESTRIL) 5 MG tablet Take 5 mg by mouth daily. May take 1 additional tablet once daily for SBP greater than 150 once hour after taking daily dose    [provider]  metoprolol tartrate (LOPRESSOR) 25 MG tablet Take 25 mg by mouth daily.    [provider]  Multiple Vitamins-Minerals (ONE-A-DAY MENS 50+ ADVANTAGE PO) Take 1 tablet by mouth daily.     [provider]  nitroGLYCERIN (NITROSTAT) 0.4 MG SL tablet Place 0.4 mg under the tongue every 5 (five) minutes as needed for chest pain. 3 DOSE MAX    [provider]  ranitidine (ZANTAC) 150 MG tablet Take 150 mg by mouth daily as needed for heartburn.     [provider]  ranolazine (RANEXA) 1000 MG SR tablet Take 1 tablet (1,000 mg total) by mouth 2 (two) times daily. 03/13/17   Rise Mu, PA-C  traZODone (DESYREL) 50 MG tablet Take 50 mg by mouth at bedtime as needed for sleep.  12/08/16   [provider]    Family History Family History  Problem Relation Age of Onset  . Lung cancer Mother   . Heart Problems Father   . Heart attack Father 35  . Stroke Father   . Heart attack Maternal Grandmother   . Stroke Maternal Grandmother   . Heart attack Paternal Uncle   . Hypertension Brother   . Autoimmune disease Neg Hx     Social History Social History  Substance Use Topics  . Smoking status: Former Smoker    Packs/day: 0.50    Types: Cigarettes    Quit date: 11/28/2015  . Smokeless  tobacco: Never Used     Comment: Quit smoking 10/2015 after MI  . Alcohol use No     Allergies   Prednisone; Tetanus toxoids; Wellbutrin [bupropion]; and Chantix [varenicline]   Review of Systems Review of Systems  Constitutional: Positive for diaphoresis. Negative for chills and fever.  Respiratory: Positive for shortness of breath ("a little bit"). Negative for cough.   Cardiovascular: Positive for chest pain. Negative for claudication and leg swelling.  Gastrointestinal: Positive for nausea. Negative for abdominal pain, constipation, diarrhea and vomiting.  Genitourinary: Negative for dysuria and hematuria.  Musculoskeletal: Negative for arthralgias and myalgias.  Skin: Negative for color change.  Allergic/Immunologic: Negative for immunocompromised state.  Neurological: Positive for light-headedness. Negative for weakness and numbness.  Psychiatric/Behavioral: Negative for confusion.   All other systems reviewed and are negative for acute change except as noted in the HPI.    Physical Exam Updated Vital Signs BP 111/72 (BP Location: Left Arm)   Pulse (!) 50   Temp 98.6 F (37 C) (Oral)   Resp 12   SpO2 100%   Physical Exam  Constitutional: He is oriented to person, place, and time. Vital signs are normal. He appears well-developed and well-nourished.  Non-toxic appearance. No distress.  Afebrile, nontoxic, NAD  HENT:  Head: Normocephalic and atraumatic.    Mouth/Throat: Oropharynx is clear and moist and mucous membranes are normal.  Eyes: Conjunctivae and EOM are normal. Right eye exhibits no discharge. Left eye exhibits no discharge.  Neck: Normal range of motion. Neck supple.  Cardiovascular: Regular rhythm, normal heart sounds and intact distal pulses.  Bradycardia present.  Exam reveals no gallop and no friction rub.   No murmur heard. Bradycardic in the 50s, reg rhythm, nl s1/s2, no m/r/g, distal pulses intact, no pedal edema   Pulmonary/Chest: Effort normal and breath sounds normal. No respiratory distress. He has no decreased breath sounds. He has no wheezes. He has no rhonchi. He has no rales. He exhibits no tenderness, no crepitus, no deformity and no retraction.  CTAB in all lung fields, no w/r/r, no hypoxia or increased WOB, speaking in full sentences, SpO2 100% on RA Chest wall with healed midline sternotomy scar, nonTTP without crepitus, deformities, or retractions   Abdominal: Soft. Normal appearance and bowel sounds are normal. He exhibits no distension. There is no tenderness. There is no rigidity, no rebound, no guarding, no CVA tenderness, no tenderness at McBurney's point and negative Murphy's sign.  Musculoskeletal: Normal range of motion.  MAE x4 Strength and sensation grossly intact in all extremities Distal pulses intact No pedal edema, neg homan's bilaterally   Neurological: He is alert and oriented to person, place, and time. He has normal strength. No sensory deficit.  Skin: Skin is warm, dry and intact. No rash noted.  Psychiatric: He has a normal mood and affect.  Nursing note and vitals reviewed.    ED Treatments / Results  Labs (all labs ordered are listed, but only abnormal results are displayed) Labs Reviewed  BASIC METABOLIC PANEL - Abnormal; Notable for the following:       Result Value   CO2 21 (*)    All other components within normal limits  CBC - Abnormal; Notable for the following:    RBC 4.08 (*)     Hemoglobin 12.3 (*)    HCT 37.4 (*)    All other components within normal limits  CBC  CREATININE, SERUM  TSH  MAGNESIUM  TROPONIN I  TROPONIN I  TROPONIN I  I-STAT TROPONIN, ED    EKG  EKG Interpretation  Date/Time:  Tuesday June 21 2017 09:57:49 EDT Ventricular Rate:  54 PR Interval:    QRS Duration: 95 QT Interval:  471 QTC Calculation: 447 R Axis:   75 Text Interpretation:  Sinus rhythm Low voltage, precordial leads Baseline wander in lead(s) V2 nonspecific ST segments Confirmed by Sherwood Gambler [phone]) on 06/21/2017 10:07:05 AM       Radiology Dg Chest Port 1 View  Result Date: 06/21/2017 CLINICAL DATA:  Chest pain EXAM: PORTABLE CHEST 1 VIEW COMPARISON:  03/10/2017 FINDINGS: Prior CABG. Heart and mediastinal contours are within normal limits. No focal opacities or effusions. No acute bony abnormality. IMPRESSION: Prior CABG.  No active cardiopulmonary disease. Electronically Signed   By: Rolm Baptise M.D.   On: 06/21/2017 10:53     Heart Cath 03/11/2017: Conclusion   Angiographically very similar to images from January 2018  Ost LM lesion, 60 %stenosed.  Ost LAD to Prox LAD lesion, 75 %stenosed. Mid LAD lesion, 55 %stenosed.  LIMA graft was visualized by angiography and is normal in caliber and anatomically normal.  SVG-Diag1- Known to be occluded 100%  Ost Ramus lesion, 90 %stenosed. Ramus lesion, 75 %stenosed.  Ost Cx lesion, 80 %stenosed. Prox Cx to Mid Cx lesion, 50 %stenosed.  Seq SVG- Ramus & OM2 graft was visualized by angiography and is large. Prox Graft to Mid Graft lesion, 40 %stenosed. Mid Graft to Dist Graft lesion, 40 %stenosed.  Ost RCA to Prox RCA lesion, 50 %stenosed. Dist RCA lesion, 50 %stenosed. RPDA lesion, 40 %stenosed - at the insertion of SVG  SVG-rPDA: Origin to Prox Graft lesion, 70 %stenosed. The lesion is identical to January 2018. The flow in the graft is reversed.  The left ventricular ejection fraction is 55-65% by  visual estimate. The left ventricular systolic function is normal.  LV end diastolic pressure is normal.   Her take a few percentage points, the patient's angiographic images are almost identical to January 2018. No obvious culprit lesion to explain the patient's symptoms.  The SVG-RPDA does have stable proximal roughly 70% smooth lesion, but there is competitive flow in the PDA with brisk flow down the native PDA.  The fact that this is similar to January 2018 would mean that the patient is not likely to be asymptomatic from this.  Would consider microvascular ischemia versus non-anginal chest pain. The patient has had several cardiac catheterizations for similar symptoms that have been roughly similar. Would recommend some type of noninvasive evaluation to have is a new baseline based upon these angiographic Images that can be used as a benchmark for future evaluations.  Standard post radial cath care.  Return to nursing unit for ongoing care.   Glenetta Hew, M.D., M.S. Interventional Cardiologist   Pager # [phone] Phone # [phone] [address] [place], [place] [address]    ETT 01/13/17: Study Highlights    Blood pressure demonstrated a normal response to exercise.  There was no ST segment deviation noted during stress.   No ischemia. Normal BP response to exercise.     Echo 11/09/16: Study Conclusions - Left ventricle: The cavity size was normal. Wall thickness was   normal. Systolic function was normal. The estimated ejection   fraction was in the range of 55% to 60%. Wall motion was normal;   there were no regional wall motion abnormalities. Doppler   parameters are consistent with abnormal left ventricular   relaxation (grade 1 diastolic dysfunction). -  Mitral valve: Calcified annulus.  Impressions: - Normal LV systolic function; mild diastolic dysfunction; trace MR   and TR.   Procedures Procedures (including critical care  time)  Medications Ordered in ED Medications  morphine 4 MG/ML injection 4 mg (not administered)  morphine 4 MG/ML injection 4 mg (4 mg Intravenous Given 06/21/17 1113)  gi cocktail (Maalox,Lidocaine,Donnatal) (30 mLs Oral Given 06/21/17 1113)     Initial Impression / Assessment and Plan / ED Course  I have reviewed the triage vital signs and the nursing notes.  Pertinent labs & imaging results that were available during my care of the patient were reviewed by me and considered in my medical decision making (see chart for details).     61 y.o. male here with sudden onset CP that began around 8:30am; initially had paresthesias in L arm and R fingertips, which has been improving, then CP began. Had some associated lightheadedness, nausea, diaphoresis, and "a little bit" of SOB. States this pain has been the worst out of all his CP episodes since his MI and emergent CABG. Hasn't had any CP since July, at which time he was admitted and had heart cath which was essentially unchanged since 08/2016 cath, but did have areas of some stenosis. Compliant with all medications. On exam, slightly bradycardic in the 50s, no reproducible chest wall tenderness, clear lungs, no abdominal tenderness, no hypoxia or tachycardia, no LE swelling. Distal pulses symmetric in all extremities. Pt took several NTG already and his BPs have subsequently become softer, so will hold off on more NTG at this time; states it only helped for a few minutes at a time and then worsened again. EKG with nonspecific ST changes. Will give morphine, get CXR and labs, and reassess shortly. Discussed case with my attending Dr. Regenia Skeeter who agrees with plan.   1:33 PM CBC not yet done, unclear why, will ensure this is done ASAP. BMP WNL. Trop negative. CXR negative. Pain improved after morphine initially, but now returning; will repeat morphine since BP still soft-ish. Will consult cardiology to discuss possible admission for unstable  angina.   1:45 PM Trish of cardiology service returning page, states cardiology will be by to see pt shortly. Will continue to monitor and reassess after CBC results and once cardiology service sees him.   3:26 PM CBC with mild chronic anemia but otherwise negative. Cardiology down to see pt and has admitted him. Holding orders to be placed by admitting team. Please see their notes for further documentation of care. I appreciate their help with this pleasant pt's care. Pt stable at time of admission.    Final Clinical Impressions(s) / ED Diagnoses   Final diagnoses:  Unstable angina (HCC)  Atypical chest pain  Lightheaded  Nausea  Bradycardia  Chronic anemia    New Prescriptions New Prescriptions   No medications on [address] [place], [place] 06/21/17 [place], MD 06/21/17 1712

## 2017-06-21 NOTE — ED Triage Notes (Signed)
Pt arrives by ems from home with cp after a sudden onset of substernal cp that started when he woke up. Pt has had 324mg  ASA by ems along with 3 sl nitro tablets. Nitro tablets did seem to bring pain done from 8 to 6 also dropped bp to 69V systolic.

## 2017-06-21 NOTE — Progress Notes (Signed)
Patient c/o chest pain 3/10, placed on 4L nasal cannula.

## 2017-06-21 NOTE — Telephone Encounter (Signed)
Pt c/o of Chest Pain: STAT if CP now or developed within 24 hours  1. Are you having CP right now? yes  2. Are you experiencing any other symptoms (ex. SOB, nausea, vomiting, sweating)? Nausea, but pt verbalized that his left Hand and feet are numb   HR 50  3. How long have you been experiencing CP? hour  4. Is your CP continuous or coming and going? both  5. Have you taken Nitroglycerin? yes ?

## 2017-06-21 NOTE — ED Notes (Signed)
Pt unaware of urinating on self. Unusual per pt.

## 2017-06-21 NOTE — Progress Notes (Signed)
Pt arrived to 4e from [facility] ED. Vitals obtained. Telemetry box applied and CCMD notified x2.  Pt complaining of chest pain 7/10. Pt requesting morphine. One time dose of morphine ordered. Pt oriented to room and staff. Will continue current plan of care.  Grant Fontana BSN, RN

## 2017-06-21 NOTE — H&P (Signed)
Cardiology Admission History and Physical:   Patient ID: [name]; MRN: [id number]; DOB: 05/22/1956   Admission date: 06/21/2017  Primary Care Provider: [name], MD Primary Cardiologist: Dr. Irish Lack Primary Electrophysiologist:  NA  Chief Complaint:  Chest pain  Patient Profile:   [name] is a 61 y.o. male with a history of CAD with hx ant. STEMI, and emergent CABG in 2017.  Multiple episodes of chest pain since that time, also multiple caths.  Possible Vasospasm with increase of amlodipine on last cath, also HLD, anxiety.    History of Present Illness:   [name] has  a history of CAD with hx ant. STEMI, and emergent CABG in 2017.  Multiple episodes of chest pain since that time, also multiple caths.  Possible Vasospasm with increase of amlodipine on last cath.   Per Dr. Irish Lack " I personally reviewed the films. His graft to the RCA does have some narrowing. It is not clear to me that this is buildup of debris in the graft but perhaps, just the morphology of the vein that was used. Regardless, his proximal RCA would be amenable to stenting, and this would likely be a more durable long-term revascularization compared to the existing vein graft. He has flow into the native diagonal, in which the SVG to diagonal is occluded. He has both native and LIMA graft flow to the LAD."  He is also on Ranexa.  He has HLD and anxiety.  (4 cardiac caths since CABG)  Last cath 03/11/17.   Today he woke with lt hand numbness and then later developed chest pain, he took 2 NTG with releif but pain returned.  A constant pain, he did take ASA.  He called EMS - he was given 3 more SL NTG in EMS but BP dropped to 90 systolic.    In ER he has had total of 8 mg morphine and GI cocktail.  No chest pain currently.  He tells me this was the worst episode except for the original MI.  8/10 today.  + associated SOB, nausea and clammy feeling.     Currently pain free.     Troponin poc 0.00 EKG  personally reviewed. ST with non specific ST abnormalities. No changes from July.  Cr 1.08, K+ 4.4 CXR no acute issues.   Past Medical History:  Diagnosis Date  . Acute hepatitis C without mention of hepatic coma(070.51)    Tx by [facility] for Hep.  negative for hep c 2/15.  Marland Kitchen Anxiety   . CAD (coronary artery disease)    a. 10/2015 ant STEMI >> LHC with 3 v CAD; oLAD tx with POBA >> emergent CABG.  . Chronic systolic CHF (congestive heart failure) ([place])   . Colitis   . Dyspnea   . Esophageal reflux    eosinophil esophagitis  . Former tobacco use   . Hypertension   . Ischemic cardiomyopathy    a. EF 25-30% at intraop TEE 4/17  //  b. Limited Echo 5/17 - EF 45-50%, mild ant HK  . Sinus bradycardia    a. HR dropping into 40s in 02/2016 -> BB reduced.  . Symptomatic hypotension    a. 02/2016 ER visit -> meds reduced.    Past Surgical History:  Procedure Laterality Date  . CARDIAC CATHETERIZATION N/A 11/28/2015   Procedure: Left Heart Cath and Coronary Angiography;  Surgeon: [name], MD;  Location: [place] [facility];  Service: Cardiovascular;  Laterality: N/A;  . CARDIAC CATHETERIZATION N/A  11/28/2015   Procedure: Coronary Balloon Angioplasty;  Surgeon: [name], MD;  Location: [place] [facility];  Service: Cardiovascular;  Laterality: N/A;  ostial LAD  . CARDIAC CATHETERIZATION N/A 11/28/2015   Procedure: Coronary/Graft Angiography;  Surgeon: [name], MD;  Location: [place] [facility];  Service: Cardiovascular;  Laterality: N/A;  coronaries only   . CARDIAC CATHETERIZATION N/A 04/21/2016   Procedure: Left Heart Cath and Coronary Angiography;  Surgeon: Wellington Hampshire, MD;  Location: [place] [facility];  Service: Cardiovascular;  Laterality: N/A;  . CARDIAC CATHETERIZATION N/A 06/14/2016   Procedure: Left Heart Cath and Cors/Grafts Angiography;  Surgeon: Lorretta Harp, MD;  Location: [place] [facility];  Service: Cardiovascular;  Laterality: N/A;  .  CARDIAC CATHETERIZATION N/A 09/08/2016   Procedure: Left Heart Cath and Cors/Grafts Angiography;  Surgeon: Wellington Hampshire, MD;  Location: [place] [facility];  Service: Cardiovascular;  Laterality: N/A;  . CORONARY ARTERY BYPASS GRAFT N/A 11/28/2015   Procedure: CORONARY ARTERY BYPASS GRAFTING (CABG) TIMES FIVE USING LEFT INTERNAL MAMMARY ARTERY AND RIGHT GREATER SAPHENOUS,VIEN HARVEATED BY ENDOVIEN, INTRAOPPRATIVE TEE;  Surgeon: Gaye Pollack, MD;  Location: [place];  Service: Open Heart Surgery;  Laterality: N/A;  . LEFT HEART CATH AND CORS/GRAFTS ANGIOGRAPHY N/A 03/11/2017   Procedure: Left Heart Cath and Cors/Grafts Angiography;  Surgeon: Leonie Man, MD;  Location: [place] [facility];  Service: Cardiovascular;  Laterality: N/A;  . PERIPHERAL VASCULAR CATHETERIZATION N/A 06/14/2016   Procedure: Lower Extremity Angiography;  Surgeon: Lorretta Harp, MD;  Location: [place] [facility];  Service: Cardiovascular;  Laterality: N/A;  . SHOULDER SURGERY       Medications Prior to Admission: Prior to Admission medications   Medication Sig Start Date End Date Taking? Authorizing Provider  amLODipine (NORVASC) 5 MG tablet Take 1.5 tablets (7.5 mg total) by mouth daily. 03/13/17  Yes Rise Mu, PA-C  aspirin 81 MG tablet Take 81 mg by mouth daily.   Yes [provider]  atorvastatin (LIPITOR) 80 MG tablet Take 1 tablet (80 mg total) by mouth daily. 11/10/16 06/21/17 Yes Donzetta Starch, NP  chlordiazePOXIDE (LIBRIUM) 10 MG capsule Take 10 mg by mouth daily as needed for anxiety.    Yes [provider]  clopidogrel (PLAVIX) 75 MG tablet take 1 tablet by mouth once daily 01/25/17  Yes Weaver, Nicki Reaper T, PA-C  ezetimibe (ZETIA) 10 MG tablet Take 1 tablet (10 mg total) by mouth daily. 08/12/16 06/21/17 Yes [name], MD  FLUoxetine (PROZAC) 20 MG capsule Take 40 mg by mouth daily.  03/15/17  Yes [provider]  fluticasone (FLONASE) 50 MCG/ACT nasal spray Place 1 spray  into both nostrils daily. 05/26/17  Yes [provider]  lisinopril (PRINIVIL,ZESTRIL) 5 MG tablet Take 5 mg by mouth daily. May take 1 additional tablet once daily for SBP greater than 150 once hour after taking daily dose   Yes [provider]  metoprolol tartrate (LOPRESSOR) 25 MG tablet Take 25 mg by mouth daily.   Yes [provider]  Multiple Vitamins-Minerals (ONE-A-DAY MENS 50+ ADVANTAGE PO) Take 1 tablet by mouth daily.    Yes [provider]  nitroGLYCERIN (NITROSTAT) 0.4 MG SL tablet Place 0.4 mg under the tongue every 5 (five) minutes as needed for chest pain. 3 DOSE MAX   Yes [provider]  pantoprazole (PROTONIX) 40 MG tablet Take 40 mg by mouth daily. 06/19/17  Yes [provider]  ranitidine (ZANTAC) 150 MG tablet Take 150 mg by mouth daily as needed for heartburn.    Yes [provider]  ranolazine (RANEXA) 1000 MG SR tablet Take 1 tablet (1,000 mg total) by mouth 2 (two) times daily. 03/13/17  Yes Dunn, Areta Haber, PA-C  traZODone (DESYREL) 50 MG tablet Take 50 mg by mouth at bedtime as needed for sleep.  12/08/16  Yes [provider]     Allergies:    Allergies  Allergen Reactions  . Prednisone Other (See Comments)    States that this med makes him "crazy"  . Tetanus Toxoids Swelling and Other (See Comments)    Fever, Swelling of the arm   . Wellbutrin [Bupropion] Other (See Comments)    Crazy thoughts, nightmares  . Chantix [Varenicline] Other (See Comments)    Dreams    Social History:   Social History   Social History  . Marital status: Married    Spouse name: [name]  . Number of children: 3  . Years of education: College   Occupational History  . Self-employed Self-Employed   Social History Main Topics  . Smoking status: Former Smoker    Packs/day: 0.50    Types: Cigarettes    Quit date: 11/28/2015  . Smokeless tobacco: Never Used     Comment: Quit smoking 10/2015 after MI  . Alcohol use  No  . Drug use: No  . Sexual activity: Not on file   Other Topics Concern  . Not on file   Social History Narrative   Patient lives at home with his spouse.   Caffeine Use: yes    Family History:   The patient's family history includes Heart Problems in his father; Heart attack in his maternal grandmother and paternal uncle; Heart attack (age of onset: 70) in his father; Hypertension in his brother; Lung cancer in his mother; Stroke in his father and maternal grandmother. There is no history of Autoimmune disease.    ROS:  Please see the history of present illness.  General:no colds or fevers, no weight changes Skin:no rashes or ulcers HEENT:no blurred vision, no congestion CV:see HPI PUL:see HPI GI:no diarrhea constipation or melena, no indigestion GU:no hematuria, no dysuria MS:no joint pain, no claudication Neuro:no syncope, no lightheadedness Endo:no diabetes, no thyroid disease   Physical Exam/Data:   Vitals:   06/21/17 1021 06/21/17 1045 06/21/17 1100 06/21/17 1116  BP: 111/72 104/69  (!) 121/59  Pulse: (!) 50 (!) 54 (!) 50 (!) 59  Resp: 12 14 19 16   Temp: 98.6 F (37 C)     TempSrc: Oral     SpO2: 100% 99% 98% 99%   No intake or output data in the 24 hours ending 06/21/17 1342 There were no vitals filed for this visit. There is no height or weight on file to calculate BMI.  General:  Well nourished, well developed, in no acute distress HEENT: normal Lymph: no adenopathy Neck: no JVD Endocrine:  No thryomegaly Vascular: No carotid bruits; 2+ pedal pulses bil  Cardiac:  normal S1, S2; RRR; no murmur, gallup rub or click  Lungs:  clear to auscultation bilaterally, no wheezing, rhonchi or rales  Abd: soft, nontender, no hepatomegaly  Ext: no lower ext edema Musculoskeletal:  No deformities, BUE and BLE strength normal and equal Skin: warm and dry  Neuro:  Alert and oriented X 3 MAE follows commands, + facial symmetry no focal abnormalities noted Psych:   Normal affect     Relevant CV Studies:  03/11/17 cardiac cath Conclusion     Angiographically very similar to images from January 2018  Ost LM lesion, 60 %stenosed.  Ost LAD to Prox LAD lesion, 75 %stenosed. Mid LAD lesion, 55 %stenosed.  LIMA graft was visualized by angiography and is normal in caliber and anatomically normal.  SVG-Diag1- Known to be occluded 100%  Ost Ramus lesion, 90 %stenosed. Ramus lesion, 75 %stenosed.  Ost Cx lesion, 80 %stenosed. Prox Cx to Mid Cx lesion, 50 %stenosed.  Seq SVG- Ramus & OM2 graft was visualized by angiography and is large. Prox Graft to Mid Graft lesion, 40 %stenosed. Mid Graft to Dist Graft lesion, 40 %stenosed.  Ost RCA to Prox RCA lesion, 50 %stenosed. Dist RCA lesion, 50 %stenosed. RPDA lesion, 40 %stenosed - at the insertion of SVG  SVG-rPDA: Origin to Prox Graft lesion, 70 %stenosed. The lesion is identical to January 2018. The flow in the graft is reversed.  The left ventricular ejection fraction is 55-65% by visual estimate. The left ventricular systolic function is normal.  LV end diastolic pressure is normal.   Her take a few percentage points, the patient's angiographic images are almost identical to January 2018. No obvious culprit lesion to explain the patient's symptoms.  The SVG-RPDA does have stable proximal roughly 70% smooth lesion, but there is competitive flow in the PDA with brisk flow down the native PDA.  The fact that this is similar to January 2018 would mean that the patient is not likely to be asymptomatic from this.  Would consider microvascular ischemia versus non-anginal chest pain. The patient has had several cardiac catheterizations for similar symptoms that have been roughly similar. Would recommend some type of noninvasive evaluation to have is a new baseline based upon these angiographic Images that can be used as a benchmark for future evaluations.  Standard post radial cath care.  Return to  nursing unit for ongoing care.   Glenetta Hew, M.D., M.S.   ECHO 11/09/16  Study Conclusions  - Left ventricle: The cavity size was normal. Wall thickness was   normal. Systolic function was normal. The estimated ejection   fraction was in the range of 55% to 60%. Wall motion was normal;   there were no regional wall motion abnormalities. Doppler   parameters are consistent with abnormal left ventricular   relaxation (grade 1 diastolic dysfunction). - Mitral valve: Calcified annulus.  Impressions:  - Normal LV systolic function; mild diastolic dysfunction; trace MR   and TR.  ETT 01/13/17 Study Highlights     Blood pressure demonstrated a normal response to exercise.  There was no ST segment deviation noted during stress.   No ischemia. Normal BP response to exercise.     Laboratory Data:  Chemistry Recent Labs Lab 06/21/17 1102  NA 138  K 4.4  CL 108  CO2 21*  GLUCOSE 89  BUN 19  CREATININE 1.08  CALCIUM 9.1  GFRNONAA >60  GFRAA >60  ANIONGAP 9    No results for input(s): PROT, ALBUMIN, AST, ALT, ALKPHOS, BILITOT in the last 168 hours. HematologyNo results for input(s): WBC, RBC, HGB, HCT, MCV, MCH, MCHC, RDW, PLT in the last 168 hours. Cardiac EnzymesNo results for input(s): TROPONINI in the last 168 hours.  Recent Labs Lab 06/21/17 1109  TROPIPOC 0.00    BNPNo results for input(s): BNP, PROBNP in the last 168 hours.  DDimer No results for input(s): DDIMER in the last 168 hours.  Radiology/Studies:  Dg Chest Port 1 View  Result Date:  06/21/2017 CLINICAL DATA:  Chest pain EXAM: PORTABLE CHEST 1 VIEW COMPARISON:  03/10/2017 FINDINGS: Prior CABG. Heart and mediastinal contours are within normal limits. No focal opacities or effusions. No acute bony abnormality. IMPRESSION: Prior CABG.  No active cardiopulmonary disease. Electronically Signed   By: Rolm Baptise M.D.   On: 06/21/2017 10:53    Assessment and Plan:   1. Chest pain some relief  with NTG now relieved with morphine, had been doing well since July.  On amlodipine 7.5 mg daily, asa plavix, metoprolol 25 daily HR here 55, Ranexa, he is on protonix and prn zantac.  First troponin is neg.  Admit to tele and monitor overnight.  Check serial troponins.   No IV heparin unless troponin positive.  Will add NTG paste--if troponins neg then lexiscan myoview if + then cardiac cath.  2.         CAD with hx MI in 2017 and emergent CABG.  4 caths since.some disease in VG  3.         HLD on statin on lipitor 80 and zetia ( in May LDL 36, HDL 37, TG 90)  4.         Anxiety on prozac and prn librium,   Severity of Illness: The appropriate patient status for this patient is OBSERVATION. Observation status is judged to be reasonable and necessary in order to provide the required intensity of service to ensure the patient's safety. The patient's presenting symptoms, physical exam findings, and initial radiographic and laboratory data in the context of their medical condition is felt to place them at decreased risk for further clinical deterioration. Furthermore, it is anticipated that the patient will be medically stable for discharge from the hospital within 2 midnights of admission. The following factors support the patient status of observation.   " The patient's presenting symptoms include angina. " The physical exam findings include currently pain free after IV morphine. " The initial radiographic and laboratory data are Stable.     For questions or updates, please contact [place] Please consult [email or web address] for contact info under Cardiology/STEMI.    Signed, Cecilie Kicks, NP  06/21/2017 1:42 PM    I have seen, examined and evaluated the patient this PM along with Valentina Shaggy.  After reviewing all the available data and chart, we discussed the patients laboratory, study & physical findings as well as symptoms in detail. I agree with her findings, examination as well as  impression recommendations as per our discussion.    Patient is known to me as I performed his last cardiac catheterization and took quite a long time reviewing his images comparing with old pictures. Very difficult situation because he was doing very well for the last 3+ months after we started him on amlodipine and increased his Imdur/Ranexa. Unfortunately he had a prolonged episode today again at rest and waking up with chest discomfort and hand numbness. There was again relieved with nitroglycerin sublingual. Unfortunately when he took more his blood pressure dropped. Pain seems to be relieved with nitroglycerin somewhat, more so with morphine.  Exam is relatively benign nothing overly significant, his EKG shows no acute changes. Initial troponin levels are negative.   I'm not convinced that this is true ACS admission. Over the last several admissions, his chest discomfort has always been at rest, and not with exertion. Reviewing his angiogram, I would not expect any of these lesions to cause resting angina, but would not be surprised to have noted exertional angina.  I am therefore somewhat concerned that he may have had coronary spasm and I'm not convinced that it may not be coronary at all.  Think the best course of action to determine if there is potential ischemic lesion would be to receive the Myoview stress test. This will then allow [name] to direct interventional therapy if there is evidence ischemia, or bleeding adjust medical management if there is no evidence of ischemia.  Since he is symptoms are relieved nitroglycerin, I would put him on nitroglycerin paste or drip depending on the protocol of the nursing floor Continue home dose of statin plus Zetia for lipids as well as aspirin and Plavix for CAD/PCI. Beta blocker and ACE inhibitor for combination CAD and potential myocardial myopathy. He is also on Norvasc for blood pressure control. We will increase his 7.5 mg. Continue Ranexa for  anti-anginal benefit...  Further decision making will be based on results of his stress test.   Glenetta Hew, M.D., M.S. Interventional Cardiologist   Pager # [phone] Phone # [phone] [address] [place], [place] [address]

## 2017-06-21 NOTE — Telephone Encounter (Signed)
Call transferred into triage.  Pt having active chest pain.  SOB "some".  Woke up with arm numbness and then chest pain started.  Has taken Ntg x 2.  Pain eases but returns.  It is constant.  Noticed worse going up stairs.  Has not taken meds this am.  Advised with his CAD, hx of CABG he should be evaluated at hospital.  Has not felt any pains since July when he was last cathed. Advised to take asa, plavix and ranexa now and to call EMS, his wife is at work.    Dr. Irish Lack informed and is in agreement.

## 2017-06-22 ENCOUNTER — Ambulatory Visit: Payer: BC Managed Care – PPO | Admitting: Licensed Clinical Social Worker

## 2017-06-22 ENCOUNTER — Observation Stay (HOSPITAL_BASED_OUTPATIENT_CLINIC_OR_DEPARTMENT_OTHER): Payer: BC Managed Care – PPO

## 2017-06-22 DIAGNOSIS — R079 Chest pain, unspecified: Secondary | ICD-10-CM | POA: Diagnosis not present

## 2017-06-22 DIAGNOSIS — I25119 Atherosclerotic heart disease of native coronary artery with unspecified angina pectoris: Secondary | ICD-10-CM | POA: Diagnosis not present

## 2017-06-22 DIAGNOSIS — Z951 Presence of aortocoronary bypass graft: Secondary | ICD-10-CM | POA: Diagnosis not present

## 2017-06-22 DIAGNOSIS — I110 Hypertensive heart disease with heart failure: Secondary | ICD-10-CM | POA: Diagnosis not present

## 2017-06-22 DIAGNOSIS — I5022 Chronic systolic (congestive) heart failure: Secondary | ICD-10-CM | POA: Diagnosis not present

## 2017-06-22 DIAGNOSIS — I25110 Atherosclerotic heart disease of native coronary artery with unstable angina pectoris: Secondary | ICD-10-CM | POA: Diagnosis not present

## 2017-06-22 LAB — NM MYOCAR MULTI W/SPECT W/WALL MOTION / EF
Estimated workload: 1 METS
Exercise duration (min): 0 min
Exercise duration (sec): 0 s
MPHR: 158 {beats}/min
Peak HR: 83 {beats}/min
Percent HR: 52 %
Rest HR: 51 {beats}/min

## 2017-06-22 LAB — BASIC METABOLIC PANEL
Anion gap: 8 (ref 5–15)
BUN: 19 mg/dL (ref 6–20)
CO2: 30 mmol/L (ref 22–32)
Calcium: 9 mg/dL (ref 8.9–10.3)
Chloride: 103 mmol/L (ref 101–111)
Creatinine, Ser: 1.25 mg/dL — ABNORMAL HIGH (ref 0.61–1.24)
GFR calc Af Amer: >60 mL/min (ref >60)
GFR calc non Af Amer: >60 mL/min (ref >60)
Glucose, Bld: 90 mg/dL (ref 65–99)
Potassium: 3.6 mmol/L (ref 3.5–5.1)
Sodium: 141 mmol/L (ref 135–145)

## 2017-06-22 LAB — CBC
HCT: 39.8 % (ref 39.0–52.0)
Hemoglobin: 13 g/dL (ref 13.0–17.0)
MCH: 30.7 pg (ref 26.0–34.0)
MCHC: 32.7 g/dL (ref 30.0–36.0)
MCV: 94.1 fL (ref 78.0–100.0)
Platelets: 181 10*3/uL (ref 150–400)
RBC: 4.23 MIL/uL (ref 4.22–5.81)
RDW: 13.4 % (ref 11.5–15.5)
WBC: 7.8 10*3/uL (ref 4.0–10.5)

## 2017-06-22 LAB — TROPONIN I: Troponin I: <0.03 ng/mL (ref <0.03)

## 2017-06-22 LAB — LIPID PANEL
Cholesterol: 76 mg/dL (ref 0–200)
HDL: 35 mg/dL — ABNORMAL LOW (ref >40)
LDL Cholesterol: 14 mg/dL (ref 0–99)
Total CHOL/HDL Ratio: 2.2 RATIO
Triglycerides: 133 mg/dL (ref <150)
VLDL: 27 mg/dL (ref 0–40)

## 2017-06-22 MED ORDER — AMLODIPINE BESYLATE 10 MG PO TABS: 10.0000 mg | ORAL_TABLET | Freq: Every day | ORAL | 3 refills | Status: DC

## 2017-06-22 MED ORDER — NITROGLYCERIN 0.4 MG SL SUBL
SUBLINGUAL_TABLET | SUBLINGUAL | Status: AC
  Filled 2017-06-22: qty 1

## 2017-06-22 MED ORDER — TECHNETIUM TC 99M TETROFOSMIN IV KIT
10.0000 | PACK | Freq: Once | INTRAVENOUS | Status: AC | PRN
  Administered 2017-06-22: 10 via INTRAVENOUS

## 2017-06-22 MED ORDER — REGADENOSON 0.4 MG/5ML IV SOLN
INTRAVENOUS | Status: AC
  Administered 2017-06-22: 10:00:00
  Filled 2017-06-22: qty 5

## 2017-06-22 MED ORDER — MORPHINE SULFATE (PF) 4 MG/ML IV SOLN
INTRAVENOUS | Status: AC
  Filled 2017-06-22: qty 1

## 2017-06-22 MED ORDER — AMLODIPINE BESYLATE 10 MG PO TABS: 10.0000 mg | ORAL_TABLET | Freq: Every day | ORAL | Status: DC

## 2017-06-22 MED ORDER — NITROGLYCERIN 0.4 MG SL SUBL: 0.4000 mg | SUBLINGUAL_TABLET | SUBLINGUAL | 3 refills | Status: DC | PRN

## 2017-06-22 MED ORDER — METOPROLOL SUCCINATE ER 25 MG PO TB24: 25.0000 mg | ORAL_TABLET | Freq: Every day | ORAL | Status: DC

## 2017-06-22 MED ORDER — METOPROLOL SUCCINATE ER 25 MG PO TB24: 25.0000 mg | ORAL_TABLET | Freq: Every day | ORAL | 3 refills | Status: DC

## 2017-06-22 MED ORDER — TECHNETIUM TC 99M TETROFOSMIN IV KIT
30.0000 | PACK | Freq: Once | INTRAVENOUS | Status: AC | PRN
  Administered 2017-06-22: 30 via INTRAVENOUS

## 2017-06-22 NOTE — Progress Notes (Signed)
Progress Note  Patient Name: [name] Date of Encounter: 06/22/2017  Primary Cardiologist: Dr. Irish Lack  Subjective   Denies any recurrent chest pain or dyspnea overnight. Seen in Nuclear Medicine for 1-day NST.   Inpatient Medications    Scheduled Meds: . amLODipine  7.5 mg Oral Daily  . aspirin EC  81 mg Oral Daily  . atorvastatin  80 mg Oral Daily  . clopidogrel  75 mg Oral Daily  . ezetimibe  10 mg Oral Daily  . FLUoxetine  40 mg Oral Daily  . fluticasone  1 spray Each Nare Daily  . heparin  5,000 Units Subcutaneous Q8H  . lisinopril  5 mg Oral Daily  . metoprolol tartrate  25 mg Oral Q24H  . multivitamin with minerals   Oral Daily  . nitroGLYCERIN  1 inch Topical Q6H  . pantoprazole  40 mg Oral Daily  . ranolazine  1,000 mg Oral BID  . regadenoson       Continuous Infusions:  PRN Meds: acetaminophen, chlordiazePOXIDE, famotidine, morphine injection, nitroGLYCERIN, ondansetron (ZOFRAN) IV, traZODone, zolpidem   Vital Signs    Vitals:   06/22/17 0229 06/22/17 0440 06/22/17 0800 06/22/17 0935  BP: 103/75 (!) 81/57 100/62 129/71  Pulse: (!) 50 (!) 51 61 (!) 56  Resp: 12 14 16    Temp:  (!) 97.5 F (36.4 C) (!) 97.4 F (36.3 C)   TempSrc:  Oral Oral   SpO2: 100% 99% 100%   Weight:      Height:        Intake/Output Summary (Last 24 hours) at 06/22/17 0950 Last data filed at 06/21/17 1830  Gross per 24 hour  Intake              360 ml  Output                0 ml  Net              360 ml   Filed Weights   06/21/17 1548  Weight: 185 lb 12.8 oz (84.3 kg)    Telemetry    Not reviewed. Seen in Nuclear Medicine.   ECG    Sinus bradycardia, HR 52. No acute ST or T-wave changes. - Personally Reviewed  Physical Exam   General: Well developed, well nourished Caucasian male appearing in no acute distress. Head: Normocephalic, atraumatic.  Neck: Supple without bruits, JVD not elevated. Lungs:  Resp regular and unlabored, CTA without wheezing or  rales. Heart: RRR, S1, S2, no S3, S4, or murmur; no rub. Abdomen: Soft, non-tender, non-distended with normoactive bowel sounds. No hepatomegaly. No rebound/guarding. No obvious abdominal masses. Extremities: No clubbing, cyanosis, or edema. Distal pedal pulses are 2+ bilaterally. Neuro: Alert and oriented X 3. Moves all extremities spontaneously. Psych: Normal affect.  Labs    Chemistry Recent Labs Lab 06/21/17 1102 06/21/17 1558 06/22/17 0256  NA 138  --  141  K 4.4  --  3.6  CL 108  --  103  CO2 21*  --  30  GLUCOSE 89  --  90  BUN 19  --  19  CREATININE 1.08 1.07 1.25*  CALCIUM 9.1  --  9.0  GFRNONAA >60 >60 >60  GFRAA >60 >60 >60  ANIONGAP 9  --  8     Hematology Recent Labs Lab 06/21/17 1356 06/21/17 1558 06/22/17 0256  WBC 6.8 9.1 7.8  RBC 4.08* 4.39 4.23  HGB 12.3* 13.8 13.0  HCT 37.4* 40.6 39.8  MCV  91.7 92.5 94.1  MCH 30.1 31.4 30.7  MCHC 32.9 34.0 32.7  RDW 13.1 13.6 13.4  PLT 164 196 181    Cardiac Enzymes Recent Labs Lab 06/21/17 1558 06/21/17 2024 06/22/17 0256  TROPONINI <0.03 <0.03 <0.03    Recent Labs Lab 06/21/17 1109  TROPIPOC 0.00     BNPNo results for input(s): BNP, PROBNP in the last 168 hours.   DDimer No results for input(s): DDIMER in the last 168 hours.   Radiology    Dg Chest Port 1 View  Result Date: 06/21/2017 CLINICAL DATA:  Chest pain EXAM: PORTABLE CHEST 1 VIEW COMPARISON:  03/10/2017 FINDINGS: Prior CABG. Heart and mediastinal contours are within normal limits. No focal opacities or effusions. No acute bony abnormality. IMPRESSION: Prior CABG.  No active cardiopulmonary disease. Electronically Signed   By: Rolm Baptise M.D.   On: 06/21/2017 10:53    Cardiac Studies   Cardiac Catheterization: 02/2017  Angiographically very similar to images from January 2018  Ost LM lesion, 60 %stenosed.  Ost LAD to Prox LAD lesion, 75 %stenosed. Mid LAD lesion, 55 %stenosed.  LIMA graft was visualized by angiography and  is normal in caliber and anatomically normal.  SVG-Diag1- Known to be occluded 100%  Ost Ramus lesion, 90 %stenosed. Ramus lesion, 75 %stenosed.  Ost Cx lesion, 80 %stenosed. Prox Cx to Mid Cx lesion, 50 %stenosed.  Seq SVG- Ramus & OM2 graft was visualized by angiography and is large. Prox Graft to Mid Graft lesion, 40 %stenosed. Mid Graft to Dist Graft lesion, 40 %stenosed.  Ost RCA to Prox RCA lesion, 50 %stenosed. Dist RCA lesion, 50 %stenosed. RPDA lesion, 40 %stenosed - at the insertion of SVG  SVG-rPDA: Origin to Prox Graft lesion, 70 %stenosed. The lesion is identical to January 2018. The flow in the graft is reversed.  The left ventricular ejection fraction is 55-65% by visual estimate. The left ventricular systolic function is normal.  LV end diastolic pressure is normal.   Her take a few percentage points, the patient's angiographic images are almost identical to January 2018. No obvious culprit lesion to explain the patient's symptoms.  The SVG-RPDA does have stable proximal roughly 70% smooth lesion, but there is competitive flow in the PDA with brisk flow down the native PDA.  The fact that this is similar to January 2018 would mean that the patient is not likely to be asymptomatic from this.  Would consider microvascular ischemia versus non-anginal chest pain. The patient has had several cardiac catheterizations for similar symptoms that have been roughly similar. Would recommend some type of noninvasive evaluation to have is a new baseline based upon these angiographic Images that can be used as a benchmark for future evaluations.  Standard post radial cath care.  Return to nursing unit for ongoing care.  Patient Profile     61 y.o. male w/ PMH of CAD (s/p CABG in 2017 with 4 caths since), HTN, HLD, and anxiety who presented to [facility] ED on 06/21/2017 for evaluation of chest pain.    Assessment & Plan    1. Chest Pain with Known CAD - patient underwent  emergent CABG in 10/2015. Has undergone 4 caths since with mild disease along SVG's but no culprit lesions.  - presented with recurrent chest pain. Cyclic troponin values have been negative. EKG shows no acute ischemic changes.  - seen in Nuclear Medicine for 1-day NST. If no significant ischemia identified, likely discharge later today.   2. HTN - BP  has been variable at 81/57 - 147/86 since admission.  - continue to follow. Remains on Amlodipine 7.5mg  daily, Lisinopril 5mg  daily, and Lopressor 25mg  daily (unclear why he is only taking this once daily). Consider switching to Toprol-XL.   3. HLD - most recent FLP in 12/2016 showed total cholesterol of 91, HDL 37, and LDL 36. At goal with LDL < 70. - continue Atorvastatin 80mg  daily.   4. Anxiety - has been continued on his PTA medication regimen.    Signed, Erma Heritage , PA-C 9:50 AM 06/22/2017 Pager: [phone]   I have seen, examined and evaluated the patient this PM after his Myoview ST results were submitted..  After reviewing all the available data and chart, we discussed the patients laboratory, study & physical findings as well as symptoms in detail. I agree with her findings, examination as well as impression recommendations as per our discussion.    Nuclear stress test results:  INTERMEDIATE RISK because of prior anteroapical myocardial infarction. No evidence of ischemia. EF 45-54%. Reviewing these images compared to previous images, there really does not appear to be any difference. No evidence of ischemia.  At this point, I suspect that the ongoing symptom of chest discomfort he is having is probably not cardiac. It would've shown up on stress test if it was active cardiac ischemia, and he really did not have relief with most medications. I don't think it that is cardiac. The brief episode that he had prior to arrival well be related to spasm.  Plan for now will be to increase his pain to 10 mg daily. We will change  his beta blocker from Lopressor to Toprol and keep it once daily. He is asked to refill the beta blocker dose as well as the sublingual nitroglycerin.  I think he is okay to discharge today. He can follow-up with either Dr. Irish Lack or advanced practitioner.    Glenetta Hew, M.D., M.S. Interventional Cardiologist   Pager # [phone] Phone # [phone] [address] [place], [place] [address]

## 2017-06-22 NOTE — Discharge Summary (Signed)
Discharge Summary    Patient ID: [name],  MRN: [id number], DOB/AGE: 1955/09/19 61 y.o.  Admit date: 06/21/2017 Discharge date: 06/22/2017  Primary Care Provider: [name] Primary Cardiologist: Dr. Irish Lack  Discharge Diagnoses    Principal Problem:   Unstable angina [facility]) Active Problems:   Coronary artery disease involving native coronary artery of native heart with angina pectoris [facility])  History of Present Illness     [name] is a 61 y.o. male with past medical history of CAD (s/p CABG in 2017 with 4 caths since), HTN, HLD, and anxiety who presented to [facility] ED on 06/21/2017 for evaluation of chest pain.    He reported initially developed hand numbness and then later developed chest pain which was relieved with SL NTG. His pain returned and he was given 3 additional SL NTG by EMS but his SBP dropped into the 90's.   While in the ED, he was given IV Morphine along with a GI cocktail and experienced resolution of his pain. Initial troponin was negative and EKG showed no acute ischemic changes.   He was therefore admitted for his enzymes to be cycled and plans to undergo stress testing the following day if enzymes remained negative.   Hospital Course     Consultants: None   The following morning, he denied any recurrent chest pain. Troponin values remained negative, therefore a Lexiscan Myoview was pursued for further ischemic evaluation. This showed a medium defect of moderate severity present in the apical anterior, apical septal, apical lateral and apex location with findings consistent with prior anterior apical myocardial infarction but no evidence of ischemia.  Therefore, his Amlodipine was increased to 10mg  daily and Metoprolol Tartrate was switched to Metoprolol Succinate for improved anti-anginal benefits. He was last examined by Dr. Ellyn Hack and deemed stable for discharge. Cardiology follow-up has been arranged. He was discharged home in good  condition.   _____________  Discharge Vitals Blood pressure 103/66, pulse 74, temperature (!) 97.2 F (36.2 C), temperature source Oral, resp. rate 14, height 5\' 9"  (1.753 m), weight 185 lb 12.8 oz (84.3 kg), SpO2 95 %.  Filed Weights   06/21/17 1548  Weight: 185 lb 12.8 oz (84.3 kg)    Labs & Radiologic Studies     CBC  Recent Labs  06/21/17 1558 06/22/17 0256  WBC 9.1 7.8  HGB 13.8 13.0  HCT 40.6 39.8  MCV 92.5 94.1  PLT 196 662   Basic Metabolic Panel  Recent Labs  06/21/17 1102 06/21/17 1558 06/22/17 0256  NA 138  --  141  K 4.4  --  3.6  CL 108  --  103  CO2 21*  --  30  GLUCOSE 89  --  90  BUN 19  --  19  CREATININE 1.08 1.07 1.25*  CALCIUM 9.1  --  9.0  MG  --  2.1  --    Liver Function Tests No results for input(s): AST, ALT, ALKPHOS, BILITOT, PROT, ALBUMIN in the last 72 hours. No results for input(s): LIPASE, AMYLASE in the last 72 hours. Cardiac Enzymes  Recent Labs  06/21/17 1558 06/21/17 2024 06/22/17 0256  TROPONINI <0.03 <0.03 <0.03   BNP Invalid input(s): POCBNP D-Dimer No results for input(s): DDIMER in the last 72 hours. Hemoglobin A1C No results for input(s): HGBA1C in the last 72 hours. Fasting Lipid Panel  Recent Labs  06/22/17 0256  CHOL 76  HDL 35*  LDLCALC 14  TRIG 133  CHOLHDL 2.2  Thyroid Function Tests  Recent Labs  06/21/17 1558  TSH 0.817    Nm Myocar Multi W/spect W/wall Motion / Ef  Result Date: 06/22/2017  There was no ST segment deviation noted during stress.  No T wave inversion was noted during stress.  Defect 1: There is a medium defect of moderate severity present in the apical anterior, apical septal, apical lateral and apex location.  Findings consistent with prior anterior apical myocardial infarction. There is no evidence of ischemia  Nuclear stress EF: 49%. The left ventricular ejection fraction is mildly decreased (45-54%).  This is an intermediate risk study based on the reduced LV  EF and known apical MI. There is no evidence of ischemia .    Dg Chest Port 1 View  Result Date: 06/21/2017 CLINICAL DATA:  Chest pain EXAM: PORTABLE CHEST 1 VIEW COMPARISON:  03/10/2017 FINDINGS: Prior CABG. Heart and mediastinal contours are within normal limits. No focal opacities or effusions. No acute bony abnormality. IMPRESSION: Prior CABG.  No active cardiopulmonary disease. Electronically Signed   By: Rolm Baptise M.D.   On: 06/21/2017 10:53     Diagnostic Studies/Procedures    None Performed.   Disposition   Pt is being discharged home today in good condition.  Follow-up Plans & Appointments    Follow-up Information    [name], MD Follow up on 07/12/2017.   Specialties:  Cardiology, Radiology, Interventional Cardiology Why:  Cardiology Follow-Up on 07/12/2017 at 2:40PM.  Contact information: [address] [address] [phone]          Discharge Instructions    Diet - low sodium heart healthy    Complete by:  As directed       Discharge Medications     Medication List    STOP taking these medications   metoprolol tartrate 25 MG tablet Commonly known as:  LOPRESSOR     TAKE these medications   amLODipine 10 MG tablet Commonly known as:  NORVASC Take 1 tablet (10 mg total) by mouth daily. What changed:  medication strength  how much to take   aspirin 81 MG tablet Take 81 mg by mouth daily.   atorvastatin 80 MG tablet Commonly known as:  LIPITOR Take 1 tablet (80 mg total) by mouth daily.   chlordiazePOXIDE 10 MG capsule Commonly known as:  LIBRIUM Take 10 mg by mouth daily as needed for anxiety.   clopidogrel 75 MG tablet Commonly known as:  PLAVIX take 1 tablet by mouth once daily   ezetimibe 10 MG tablet Commonly known as:  ZETIA Take 1 tablet (10 mg total) by mouth daily.   FLUoxetine 20 MG capsule Commonly known as:  PROZAC Take 40 mg by mouth daily.   fluticasone 50 MCG/ACT nasal  spray Commonly known as:  FLONASE Place 1 spray into both nostrils daily.   lisinopril 5 MG tablet Commonly known as:  PRINIVIL,ZESTRIL Take 5 mg by mouth daily. May take 1 additional tablet once daily for SBP greater than 150 once hour after taking daily dose   metoprolol succinate 25 MG 24 hr tablet Commonly known as:  TOPROL-XL Take 1 tablet (25 mg total) by mouth daily.   nitroGLYCERIN 0.4 MG SL tablet Commonly known as:  NITROSTAT Place 1 tablet (0.4 mg total) under the tongue every 5 (five) minutes as needed for chest pain. 3 DOSE MAX   ONE-A-DAY MENS 50+ ADVANTAGE PO Take 1 tablet by mouth daily.   pantoprazole 40 MG  tablet Commonly known as:  PROTONIX Take 40 mg by mouth daily.   ranitidine 150 MG tablet Commonly known as:  ZANTAC Take 150 mg by mouth daily as needed for heartburn.   ranolazine 1000 MG SR tablet Commonly known as:  RANEXA Take 1 tablet (1,000 mg total) by mouth 2 (two) times daily.   traZODone 50 MG tablet Commonly known as:  DESYREL Take 50 mg by mouth at bedtime as needed for sleep.       Allergies Allergies  Allergen Reactions  . Prednisone Other (See Comments)    States that this med makes him "crazy"  . Tetanus Toxoids Swelling and Other (See Comments)    Fever, Swelling of the arm   . Wellbutrin [Bupropion] Other (See Comments)    Crazy thoughts, nightmares  . Chantix [Varenicline] Other (See Comments)    Dreams     Outstanding Labs/Studies   None  Duration of Discharge Encounter   Greater than 30 minutes including physician time.  Signed, Erma Heritage, PA-C 06/22/2017, 7:30 PM

## 2017-06-22 NOTE — Progress Notes (Signed)
Patient in a stable condition, discharge education reviewed with patient he verbalized understanding, iv removed, tele dc ccmd notified, patient belongings at bedside, patient awaiting his wife for transportation home

## 2017-06-22 NOTE — Progress Notes (Signed)
Patient c/o chest pain 6/10 1 nitro SL given, patient resting quietly in bed, vital signs are stable, no sign of distress noted, will continue to monitor.

## 2017-07-06 ENCOUNTER — Ambulatory Visit: Payer: BC Managed Care – PPO | Admitting: Licensed Clinical Social Worker

## 2017-07-06 DIAGNOSIS — F3341 Major depressive disorder, recurrent, in partial remission: Secondary | ICD-10-CM

## 2017-07-12 ENCOUNTER — Encounter: Payer: Self-pay | Admitting: Interventional Cardiology

## 2017-07-12 ENCOUNTER — Ambulatory Visit: Payer: BC Managed Care – PPO | Admitting: Interventional Cardiology

## 2017-07-12 VITALS — BP 118/68 | HR 70 | Ht 69.0 in | Wt 191.6 lb

## 2017-07-12 DIAGNOSIS — I252 Old myocardial infarction: Secondary | ICD-10-CM | POA: Diagnosis not present

## 2017-07-12 DIAGNOSIS — I25119 Atherosclerotic heart disease of native coronary artery with unspecified angina pectoris: Secondary | ICD-10-CM

## 2017-07-12 DIAGNOSIS — E7800 Pure hypercholesterolemia, unspecified: Secondary | ICD-10-CM | POA: Diagnosis not present

## 2017-07-12 DIAGNOSIS — I10 Essential (primary) hypertension: Secondary | ICD-10-CM | POA: Diagnosis not present

## 2017-07-12 NOTE — Patient Instructions (Signed)
Medication Instructions:  Your physician recommends that you continue on your current medications as directed. Please refer to the Current Medication list given to you today.   Labwork: None ordered  Testing/Procedures: None ordered  Follow-Up: Your physician wants you to follow-up in: 6 months with Dr. Varanasi. You will receive a reminder letter in the mail two months in advance. If you don't receive a letter, please call our office to schedule the follow-up appointment.   Any Other Special Instructions Will Be Listed Below (If Applicable).     If you need a refill on your cardiac medications before your next appointment, please call your pharmacy.   

## 2017-07-12 NOTE — Progress Notes (Signed)
Cardiology Office Note   Date:  07/12/2017   ID:  [name], DOB 12-08-1955, MRN [id number]  PCP:  Anthony Melter, MD    No chief complaint on file. CAD   Wt Readings from Last 3 Encounters:  07/12/17 191 lb 9.6 oz (86.9 kg)  06/21/17 185 lb 12.8 oz (84.3 kg)  03/18/17 187 lb 6.4 oz (85 kg)       History of Present Illness: [name] is a 61 y.o. male  with CAD, s/p anterior STEMI and emergent CABG in 2017.    He has had multiple episodes of chest pain since then and multiple caths.    He was last in the hospital a few weeks ago.  He had a negative w/u and was sent home.  He had a cath that was unchanged on 03/11/17.  Concern for vasospasm.  Amlodipine was increased.  He required severeal doses of Morphine which helped the pain as well.  with CAD, s/p anterior STEMI and emergent CABG in 2017.    He has had multiple episodes of chest pain since then and multiple caths.    Multiple cardiac caths over the course of 2018. I personally reviewed the films. His graft to the RCA does have some narrowing. It is not clear to me that this is buildup of debris in the graft but perhaps, just the morphology of the vein that was used. Regardless, his proximal RCA would be amenable to stenting, and this would likely be a more durable long-term revascularization compared to the existing vein graft. He has flow into the native diagonal, in which the SVG to diagonal is occluded. He has both native and LIMA graft flow to the LAD.  He had some chest pain in October 2018 and was hospitalized.  He had a negative stress test.    Since that time, he Denies : Dizziness. Leg edema. Nitroglycerin use. Orthopnea. Palpitations. Paroxysmal nocturnal dyspnea. Shortness of breath. Syncope.   He has had some chest pain going to the right shoulder that he thinks is a pulled muscle.    EGD and colonoscopy were essentially negative per his report.    Past Medical History:  Diagnosis Date    . Anxiety   . Basal cell carcinoma (BCC) of forehead   . CAD (coronary artery disease)    a. 10/2015 ant STEMI >> LHC with 3 v CAD; oLAD tx with POBA >> emergent CABG.  . Chronic systolic CHF (congestive heart failure) ([place])   . Depression   . Dyspnea   . Esophageal reflux    eosinophil esophagitis  . Family history of adverse reaction to anesthesia    "sister has PONV" (06/21/2017)  . Former tobacco use   . Gout   . Hepatitis C    "treated and cured" (06/21/2017)  . High cholesterol   . Hypertension   . Ischemic cardiomyopathy    a. EF 25-30% at intraop TEE 4/17  //  b. Limited Echo 5/17 - EF 45-50%, mild ant HK  . Migraine    "3-4/yr" (06/21/2017)  . Myocardial infarction ([place]) 10/2015  . Sinus bradycardia    a. HR dropping into 40s in 02/2016 -> BB reduced.  . Stroke ([place]) 10/2016   "small one; sometimes my memory/cognitive issues" (06/21/2017)  . Symptomatic hypotension    a. 02/2016 ER visit -> meds reduced.    Past Surgical History:  Procedure Laterality Date  . BASAL CELL CARCINOMA EXCISION     "forehead  .  CARDIAC CATHETERIZATION    . HUMERUS SURGERY Right 1969   "tumor inside bone; filled it w/bone chips"     Current Outpatient Medications  Medication Sig Dispense Refill  . amLODipine (NORVASC) 10 MG tablet Take 1 tablet (10 mg total) by mouth daily. 90 tablet 3  . aspirin 81 MG tablet Take 81 mg by mouth daily.    . chlordiazePOXIDE (LIBRIUM) 10 MG capsule Take 10 mg by mouth daily as needed for anxiety.     . clopidogrel (PLAVIX) 75 MG tablet take 1 tablet by mouth once daily 30 tablet 11  . FLUoxetine (PROZAC) 20 MG capsule Take 40 mg by mouth daily.   1  . fluticasone (FLONASE) 50 MCG/ACT nasal spray Place 1 spray into both nostrils daily.  0  . lisinopril (PRINIVIL,ZESTRIL) 5 MG tablet Take 5 mg by mouth daily. May take 1 additional tablet once daily for SBP greater than 150 once hour after taking daily dose    . metoprolol succinate (TOPROL-XL) 25 MG 24  hr tablet Take 1 tablet (25 mg total) by mouth daily. 90 tablet 3  . Multiple Vitamins-Minerals (ONE-A-DAY MENS 50+ ADVANTAGE PO) Take 1 tablet by mouth daily.     . nitroGLYCERIN (NITROSTAT) 0.4 MG SL tablet Place 1 tablet (0.4 mg total) under the tongue every 5 (five) minutes as needed for chest pain. 3 DOSE MAX 25 tablet 3  . olopatadine (PATANOL) 0.1 % ophthalmic solution Place 1 drop as needed into both eyes.  0  . pantoprazole (PROTONIX) 40 MG tablet Take 40 mg by mouth daily.  2  . ranitidine (ZANTAC) 150 MG tablet Take 150 mg by mouth daily as needed for heartburn.     . ranolazine (RANEXA) 1000 MG SR tablet Take 1 tablet (1,000 mg total) by mouth 2 (two) times daily. 60 tablet 11  . traZODone (DESYREL) 50 MG tablet Take 50 mg by mouth at bedtime as needed for sleep.     Marland Kitchen atorvastatin (LIPITOR) 80 MG tablet Take 1 tablet (80 mg total) by mouth daily. 30 tablet 2  . ezetimibe (ZETIA) 10 MG tablet Take 1 tablet (10 mg total) by mouth daily. 30 tablet 11   No current facility-administered medications for this visit.     Allergies:   Prednisone; Tetanus toxoids; Wellbutrin [bupropion]; and Chantix [varenicline]    Social History:  The patient  reports that he quit smoking about 19 months ago. His smoking use included cigarettes. He has a 33.00 pack-year smoking history. he has never used smokeless tobacco. He reports that he drinks alcohol. He reports that he uses drugs.   Family History:  The patient's family history includes Heart Problems in his father; Heart attack in his maternal grandmother and paternal uncle; Heart attack (age of onset: 30) in his father; Hypertension in his brother; Lung cancer in his mother; Stroke in his father and maternal grandmother.    ROS:  Please see the history of present illness.   Otherwise, review of systems are positive for chest pain.   All other systems are reviewed and negative.    PHYSICAL EXAM: VS:  BP 118/68   Pulse 70   Ht 5\' 9"  (1.753 m)    Wt 191 lb 9.6 oz (86.9 kg)   SpO2 98%   BMI 28.29 kg/m  , BMI Body mass index is 28.29 kg/m. GEN: Well nourished, well developed, in no acute distress  HEENT: normal  Neck: no JVD, carotid bruits, or masses Cardiac: RRR; no murmurs,  rubs, or gallops,no edema  Respiratory:  clear to auscultation bilaterally, normal work of breathing GI: soft, nontender, nondistended, + BS MS: no deformity or atrophy  Skin: warm and dry, no rash Neuro:  Strength and sensation are intact Psych: euthymic mood, full affect    Recent Labs: 01/04/2017: B Natriuretic Peptide 110.6 03/10/2017: ALT 27 06/21/2017: Magnesium 2.1; TSH 0.817 06/22/2017: BUN 19; Creatinine, Ser 1.25; Hemoglobin 13.0; Platelets 181; Potassium 3.6; Sodium 141   Lipid Panel    Component Value Date/Time   CHOL 76 06/22/2017 0256   CHOL 130 09/30/2016 0939   TRIG 133 06/22/2017 0256   HDL 35 (L) 06/22/2017 0256   HDL 55 09/30/2016 0939   CHOLHDL 2.2 06/22/2017 0256   VLDL 27 06/22/2017 0256   LDLCALC 14 06/22/2017 0256   LDLCALC 54 09/30/2016 0939     Other studies Reviewed: Additional studies/ records that were reviewed today with results demonstrating: Cath study result reviewed.  Stress test result reviewed.     ASSESSMENT AND PLAN:  1. CAD/Old MI: No angina at this time.  He has had some atypical chest pains.  Continue medical therapy.  If you were to have anginal type chest pain, would strongly consider cath with PCI of the native RCA, given the condition of the SVG to RCA. 2. Hyperlipidemia: LDL quite low.  He thinks this was a lab error as there was difficulty in getting blood from him.  The most recent labs in October do not correlate to his previous labs from February.  Continue atorvastatin and we will recheck lipids in 6 months. 3. Chest pain: Atypical.  He was somewhat disturbed that 1 of the doctors in the hospital told him that perhaps his chest pain was more psychological than anything else.  He does not think  that it is psychological at all. 4. Hypertension: BP controlled on amlodipine, to also help with possible vasospasm.   Current medicines are reviewed at length with the patient today.  The patient concerns regarding his medicines were addressed.  The following changes have been made:  No change  Labs/ tests ordered today include:  No orders of the defined types were placed in this encounter.   Recommend 150 minutes/week of aerobic exercise Low fat, low carb, high fiber diet recommended  Disposition:   FU in 6 months   Signed, Larae Grooms, MD  07/12/2017 3:04 PM    [facility] [place], [place], [place]  [address] Phone: [phone]; Fax: [phone]

## 2017-07-19 ENCOUNTER — Ambulatory Visit: Payer: Self-pay | Admitting: Interventional Cardiology

## 2017-08-03 ENCOUNTER — Ambulatory Visit (INDEPENDENT_AMBULATORY_CARE_PROVIDER_SITE_OTHER): Payer: BC Managed Care – PPO | Admitting: Licensed Clinical Social Worker

## 2017-08-03 DIAGNOSIS — F3341 Major depressive disorder, recurrent, in partial remission: Secondary | ICD-10-CM

## 2017-08-09 ENCOUNTER — Ambulatory Visit: Payer: BC Managed Care – PPO | Admitting: Licensed Clinical Social Worker

## 2017-08-11 ENCOUNTER — Telehealth: Payer: Self-pay | Admitting: Interventional Cardiology

## 2017-08-11 DIAGNOSIS — E7800 Pure hypercholesterolemia, unspecified: Secondary | ICD-10-CM

## 2017-08-11 MED ORDER — ICOSAPENT ETHYL 1 G PO CAPS: 4.0000 g | ORAL_CAPSULE | Freq: Every day | ORAL | 11 refills | Status: DC

## 2017-08-11 NOTE — Telephone Encounter (Signed)
Patient requesting to try vascepa and see if he would be able to stop his atorvastatin and zetia. Made patient aware that I would discuss with RPH and call him back.

## 2017-08-11 NOTE — Telephone Encounter (Signed)
New Message     Patient would like to try vascepa , and wants to know if Dr Irish Lack will write him a prescription for it?

## 2017-08-11 NOTE — Telephone Encounter (Signed)
Cussed with Jinny Blossom, [facility] and Dr. Irish Lack. Called and made patient aware that he can try Vascepa 4 grams daily but he will have to continue his atorvastatin but he may be able to stop his zetia at some point. Patient verbalizes understanding. He states that he will continue taking his atorvastatin as well as his zetia for now and will see if the vascepa helps. Vascepa 4 g QD ordered and sent to patient's preferred pharmacy. Made patient aware that I will send him the [facility] co-pay card via mail and that it may take 7-10 business days to arrive. Patient verbalized understanding. Address verified. LIPIDS and LFTs ordered to be checked in 3 months and appointment made on 11/22/17. Patient verbalized understanding and thanked me for the call.

## 2017-08-12 ENCOUNTER — Telehealth: Payer: Self-pay

## 2017-08-12 NOTE — Telephone Encounter (Signed)
Prior auth for Vascepa 1 gm submitted to [facility]. They have a 24-48 hr turnaround time.

## 2017-08-16 ENCOUNTER — Other Ambulatory Visit: Payer: Self-pay | Admitting: Interventional Cardiology

## 2017-08-16 MED ORDER — ATORVASTATIN CALCIUM 80 MG PO TABS: 80.0000 mg | ORAL_TABLET | Freq: Every day | ORAL | 10 refills | Status: DC

## 2017-08-24 ENCOUNTER — Telehealth: Payer: Self-pay | Admitting: Interventional Cardiology

## 2017-08-24 NOTE — Telephone Encounter (Signed)
New message     Patient called last week about  Icosapent Ethyl (VASCEPA) 1 g CAPS Take 4 g by mouth daily.    this medication needs a pre-authorization before it can be filled

## 2017-08-25 NOTE — Telephone Encounter (Signed)
Follow up     Patient wants to know the status on the pre-authorization for this medication

## 2017-08-29 ENCOUNTER — Telehealth: Payer: Self-pay | Admitting: Interventional Cardiology

## 2017-08-29 NOTE — Telephone Encounter (Signed)
New Message    Patient calling about prior authorization for Vascepa 1 gm. Please call.

## 2017-08-29 NOTE — Telephone Encounter (Signed)
**Note De-Identified  Obfuscation** I called [facility] and s/w Christina who advised me that the PA on Vascepa has been denied because the pts Triglyceride level is at 133 per results from 06/22/17. Triglyceride level must be greater than or equal to 500 in order for the pts insurance to cover it.  I have left a detailed message on the pts VM and left this office's phone number in message so he can call back if he has any questions.

## 2017-09-12 IMAGING — CR DG CHEST 2V
2 series · 2 of 2 positions shown · non-contrast
Comparison: 03/07/2015

CLINICAL DATA: Cough for 3 days

EXAM:
CHEST  2 VIEW

[w chest pa]
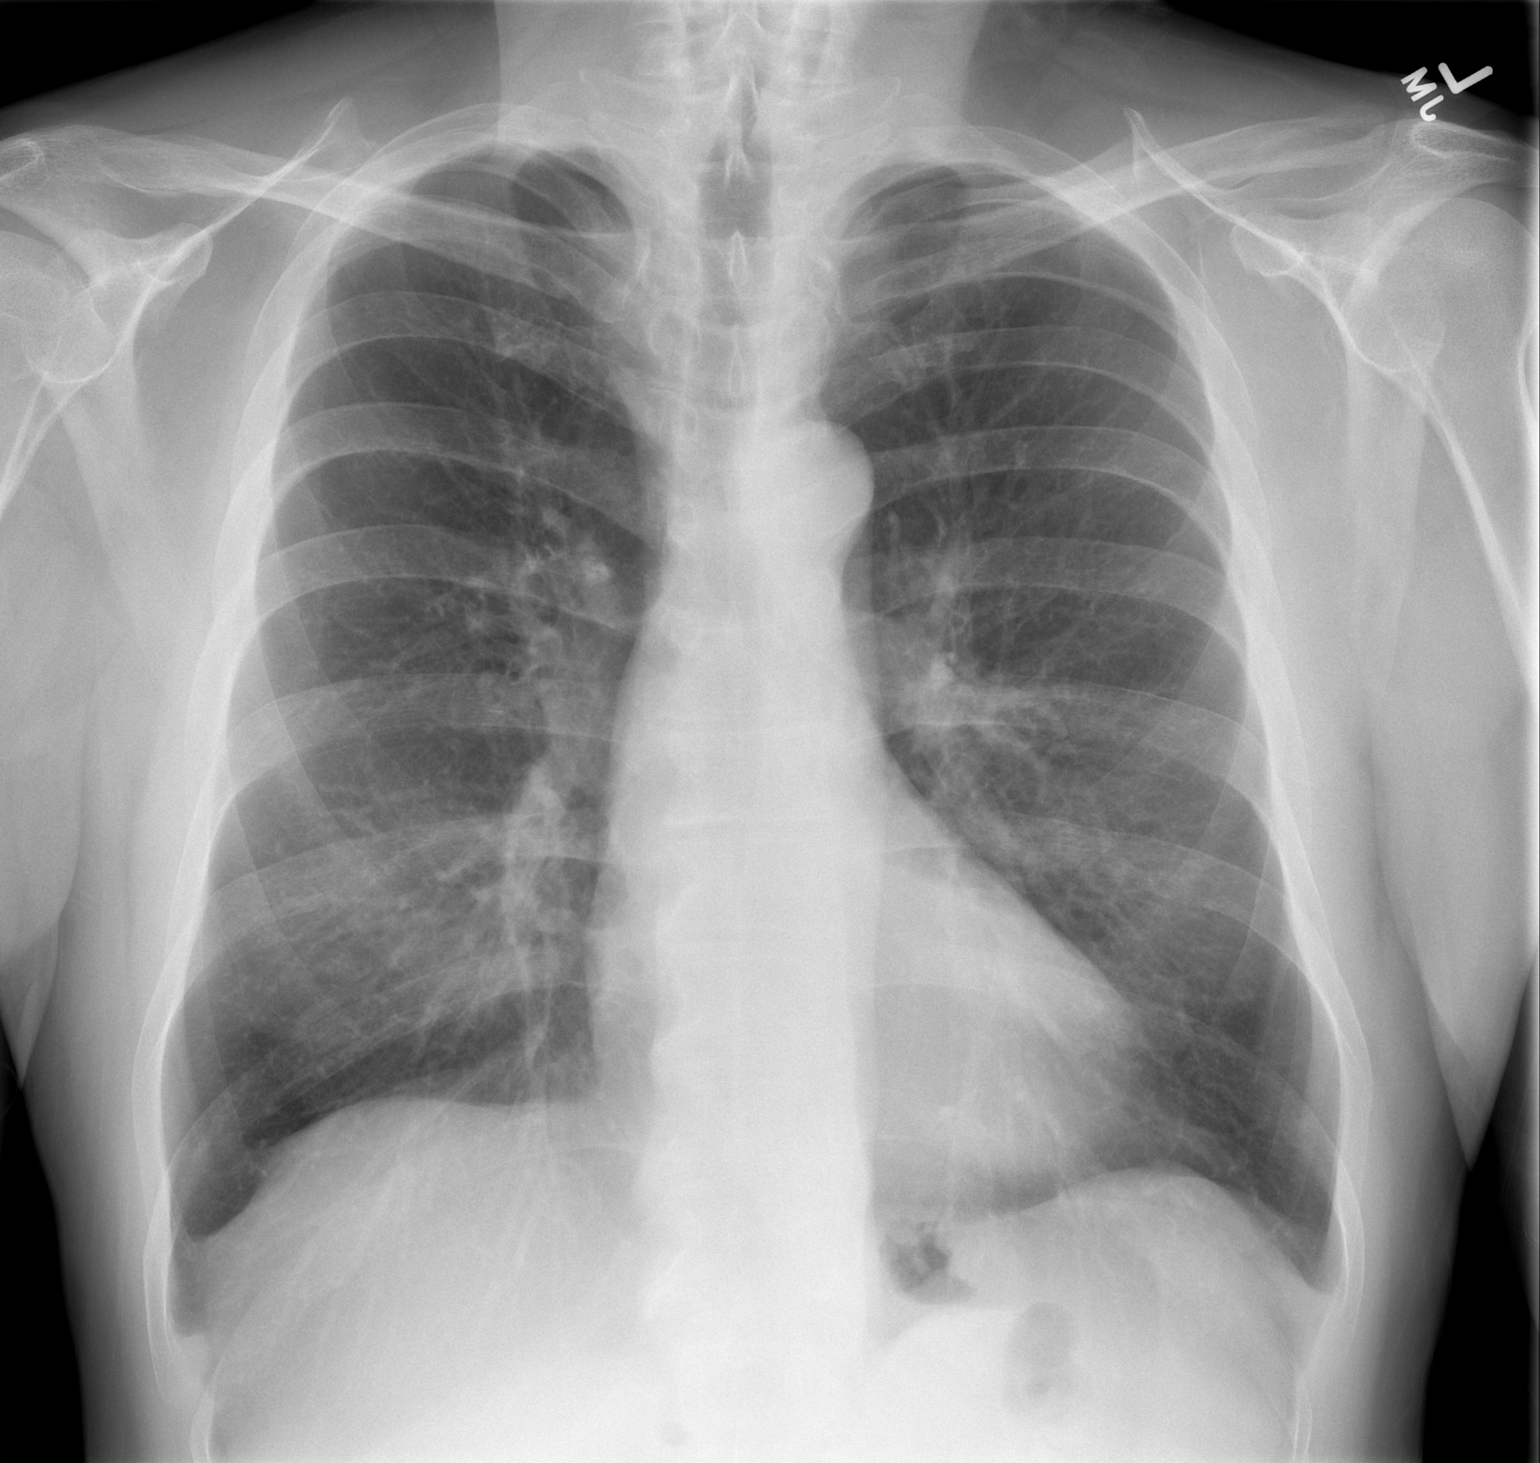

[w chest lat]
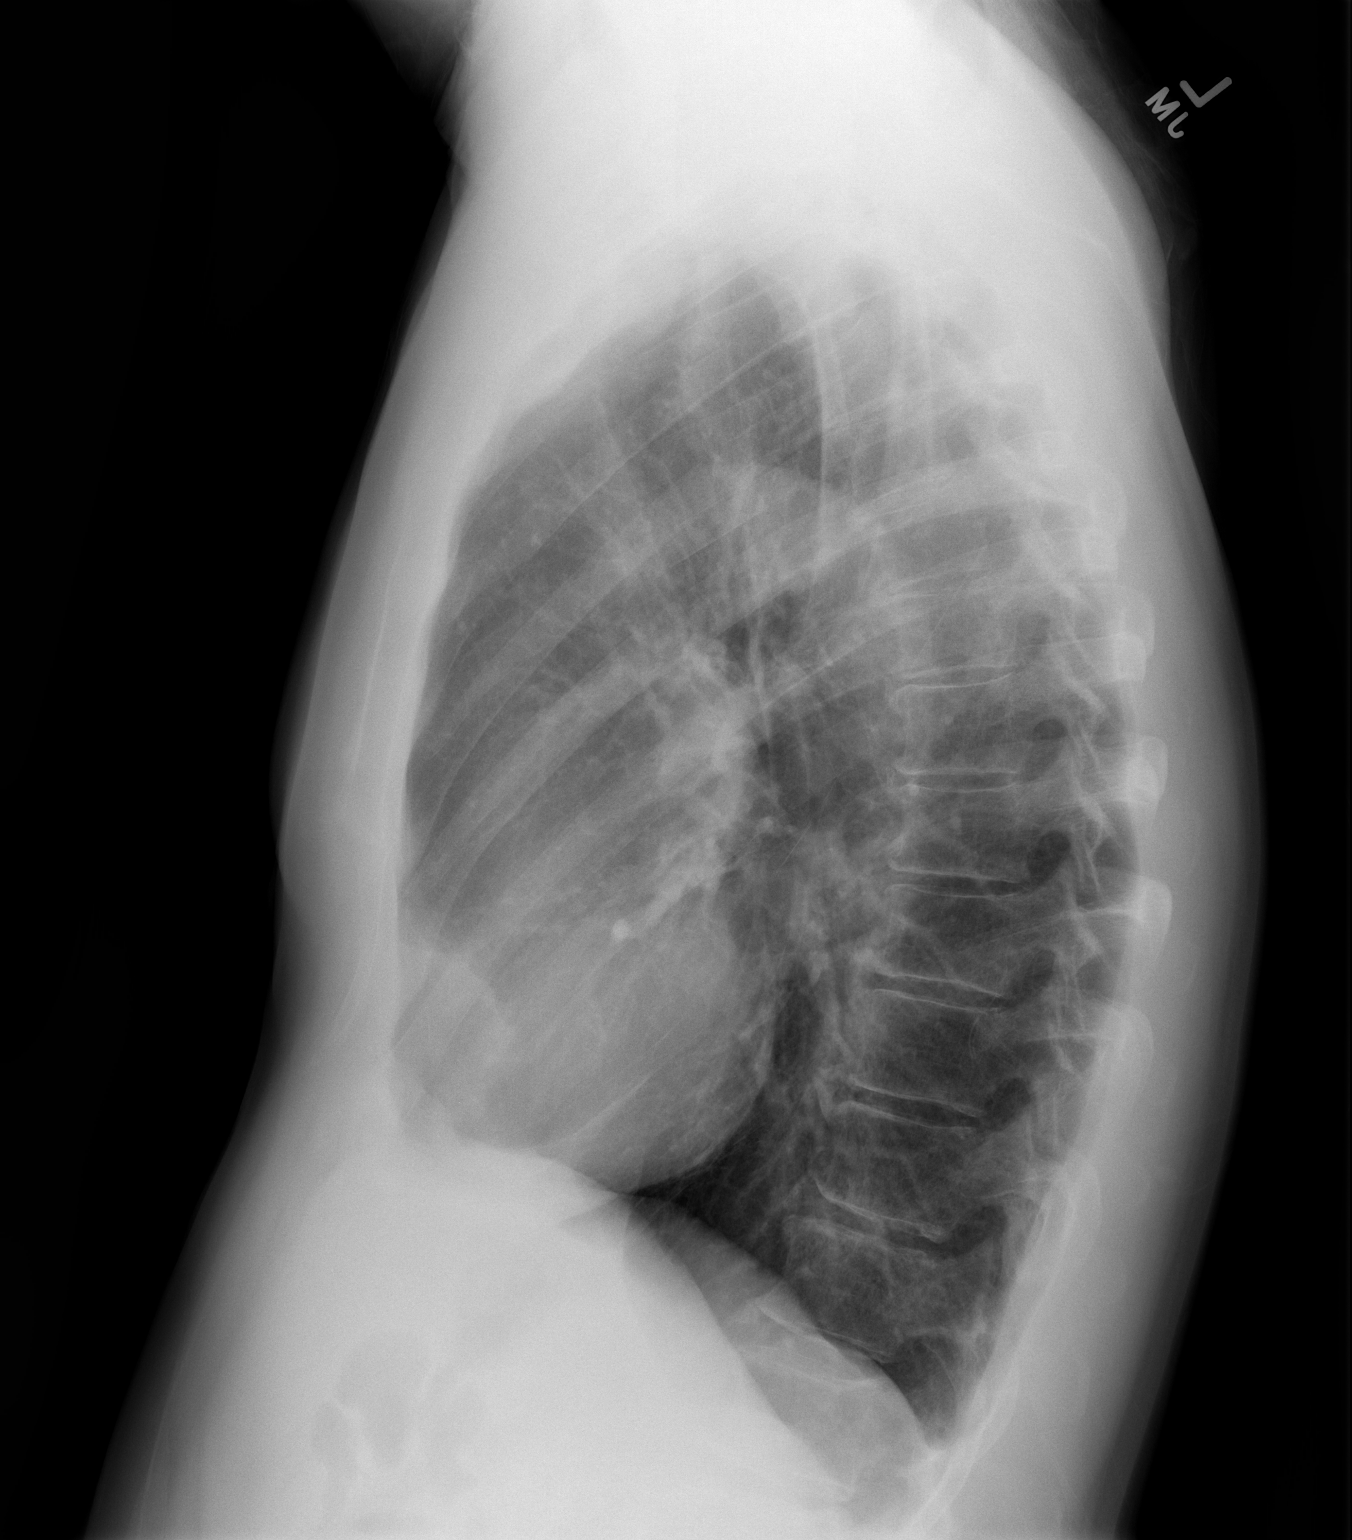

[2 of 2 positions shown; findings below may reference images not displayed]

FINDINGS: Normal heart size. Lungs clear. No pneumothorax. No pleural
effusion.
IMPRESSION: No active cardiopulmonary disease.

## 2017-09-27 IMAGING — CR DG CHEST 1V PORT
1 series · 1 of 1 positions shown · non-contrast
Comparison: 11/28/2015 and prior exams

CLINICAL DATA: 59-year-old male status post CABG.

EXAM:
PORTABLE CHEST 1 VIEW

[AP]
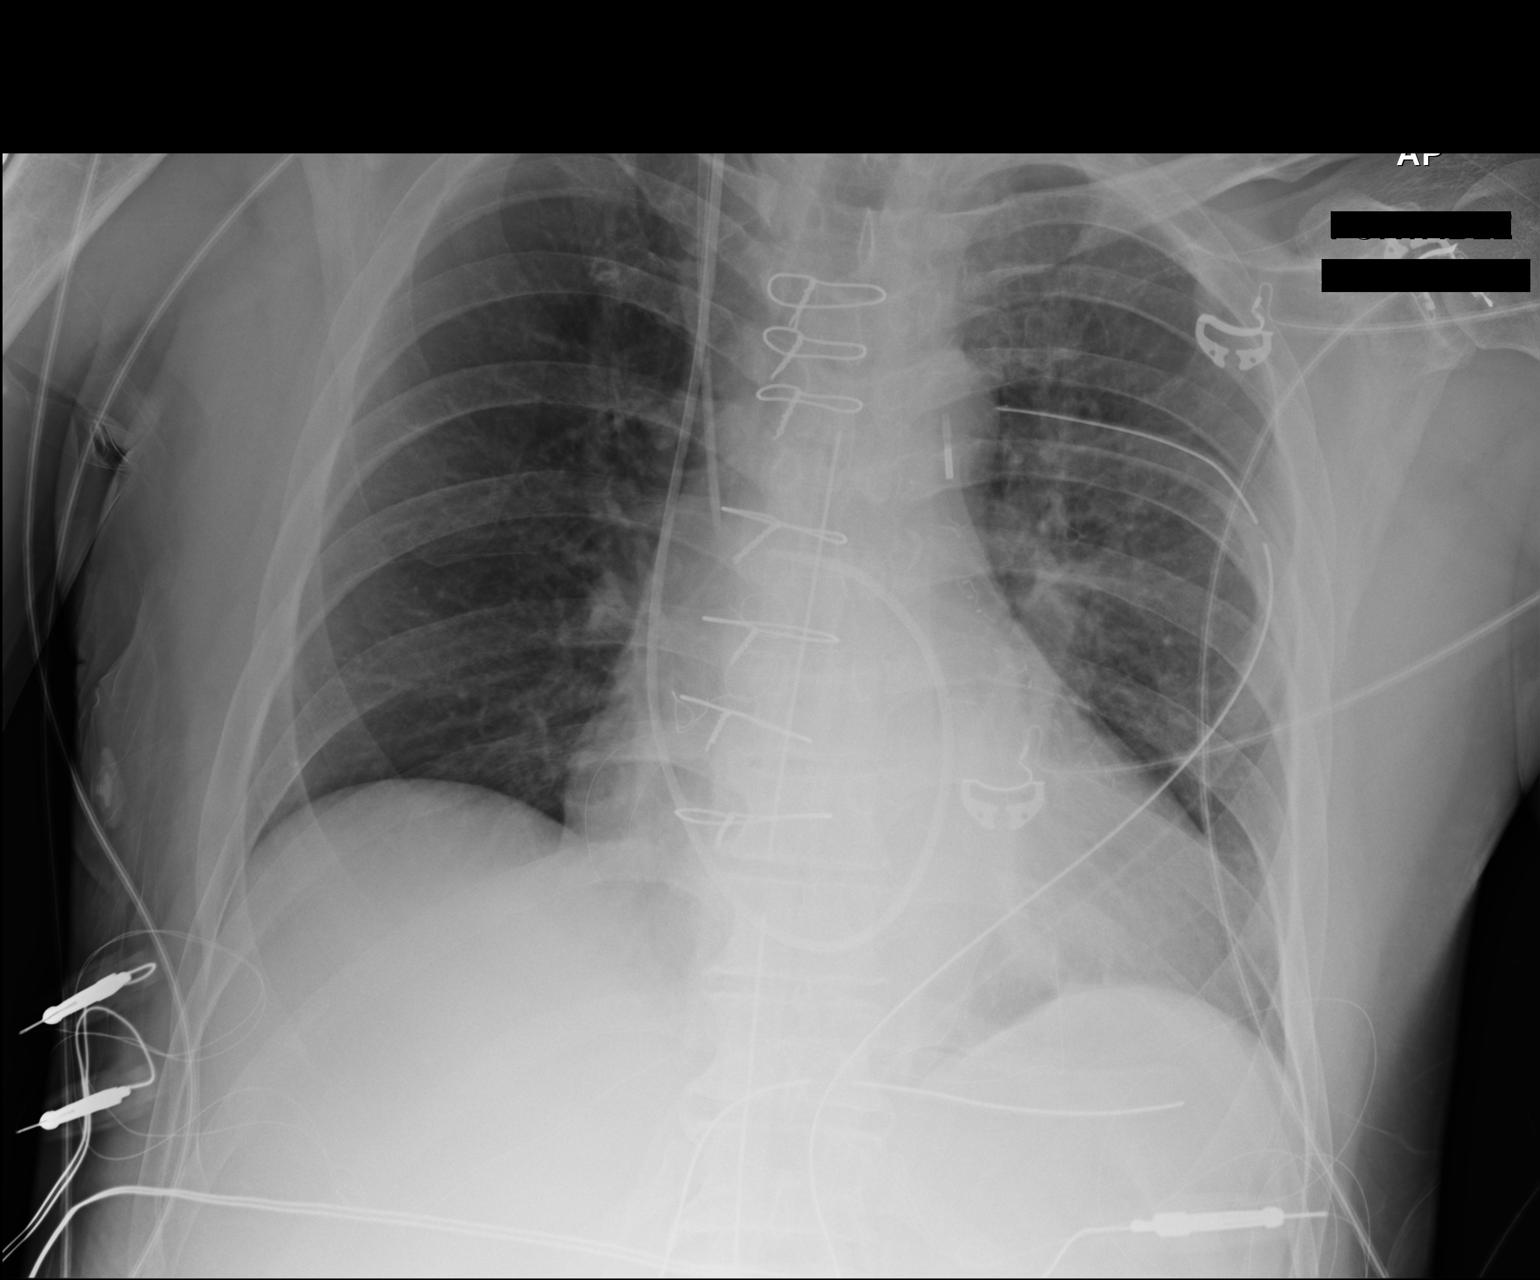

[1 of 1 positions shown; findings below may reference images not displayed]

FINDINGS: Cardiomegaly and CABG changes again identified.

An NG tube and endotracheal tube have been removed.

A right IJ central venous catheter with tip overlying the lower SVC,
right IJ Swan-Ganz catheter with tip overlying the right pulmonary
artery, intra aortic balloon pump with tip at the aortic arch, and
mediastinal/left thoracostomy tubes again noted.

There is no evidence of pneumothorax.

Mild left basilar atelectasis again noted.
IMPRESSION: NG and endotracheal tube removal. No other significant changes. No
evidence of pneumothorax.

## 2017-09-28 IMAGING — CR DG CHEST 1V PORT
1 series · 1 of 1 positions shown · non-contrast
Comparison: 11/29/2015

CLINICAL DATA: Status post CABG.

EXAM:
PORTABLE CHEST 1 VIEW

[AP]
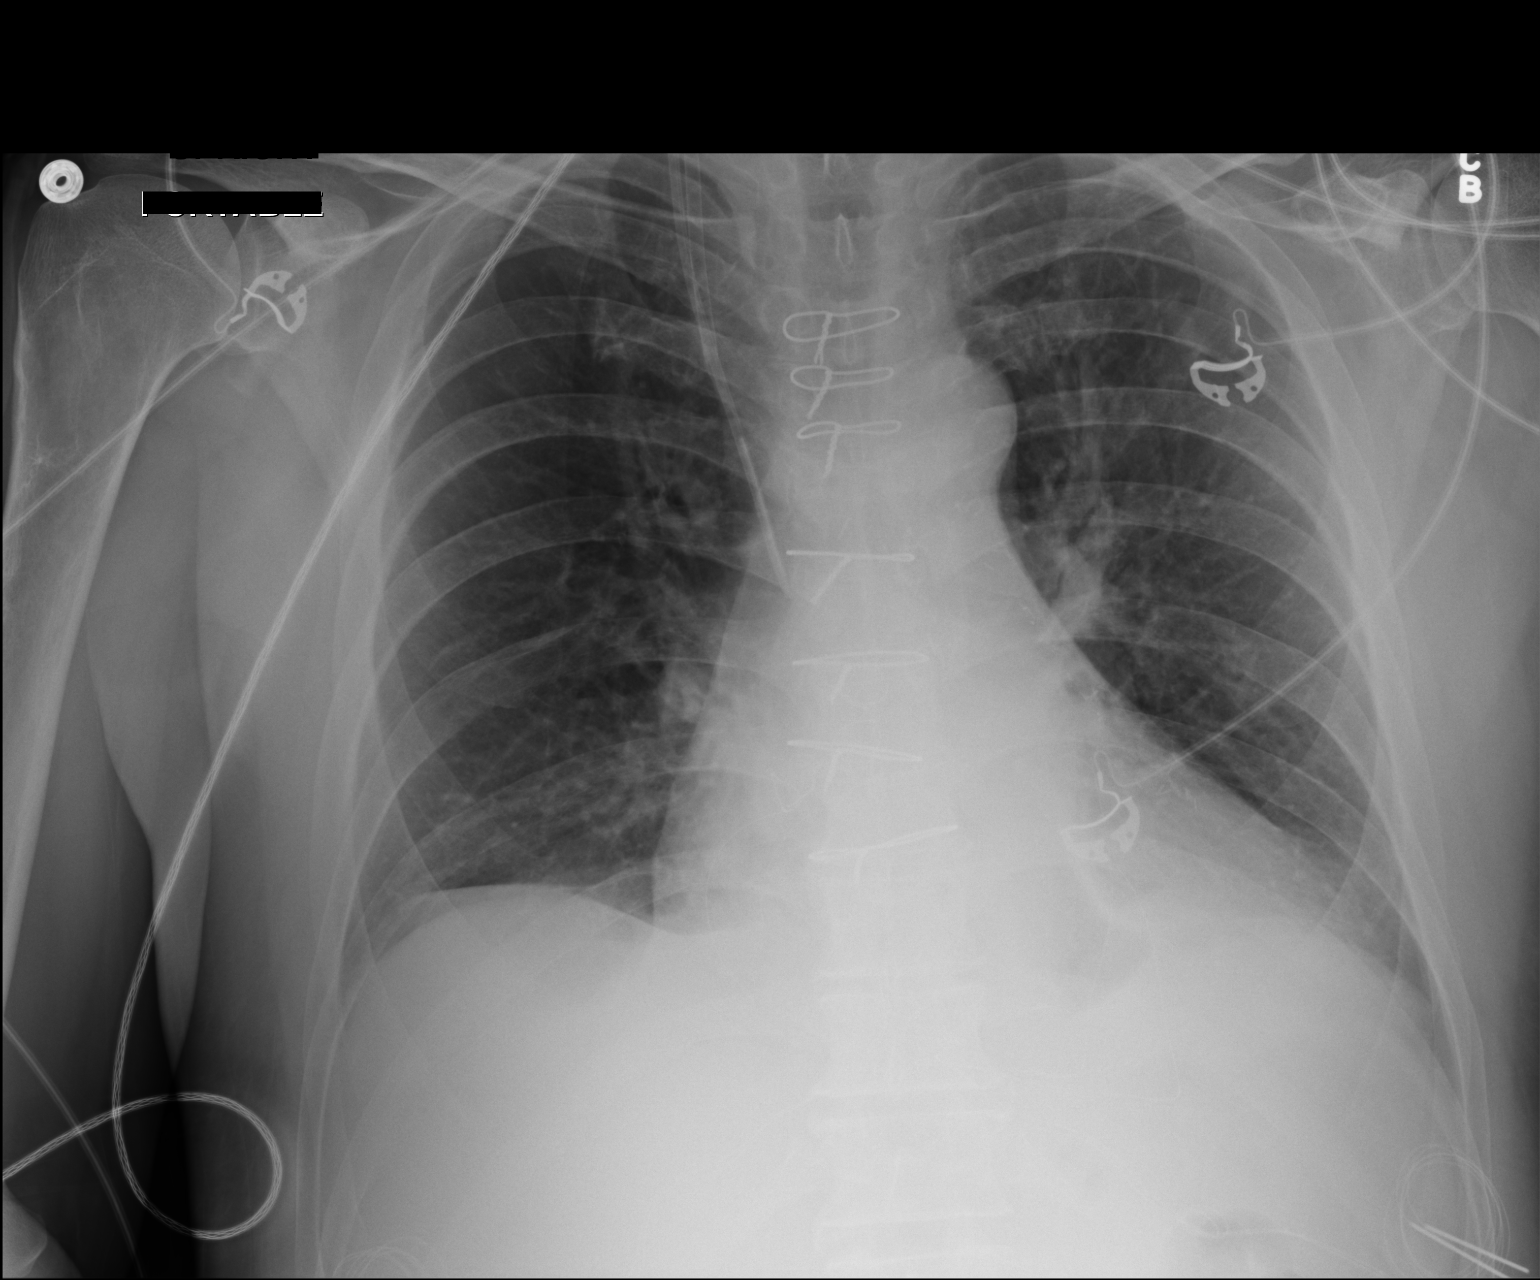

[1 of 1 positions shown; findings below may reference images not displayed]

FINDINGS: Cardiomegaly and median sternotomy again noted.

Interval removal of intra-aortic balloon pump, mediastinal and left
thoracostomy tubes and Swan-Ganz catheter noted.

There is no evidence of pneumothorax.

Mild bibasilar atelectasis identified without edema.

A right IJ central venous catheter sheath and right IJ central
venous catheter with tip overlying the lower SVC noted.
IMPRESSION: Support apparatus removal as described without other significant
change. Mild bibasilar atelectasis without evidence of pneumothorax.

## 2017-10-03 ENCOUNTER — Telehealth: Payer: Self-pay | Admitting: Interventional Cardiology

## 2017-10-03 NOTE — Telephone Encounter (Signed)
Called patient about his symptoms. Patient stated he had been having chest pain for about two weeks. Today patient stated he has taken nitro and that it helps. Patient stated it does not feel like it did when he had his heart attack. Patient stated his pain is in his chest and it radiates out from his chest. Informed patient that he might need to go to the ED. Patient stated he does not feel like he can drive, but he can call his wife, who is at work. Patient is not sure if he will be able to find anyone to take him to the ED. Made patient an appointment with one of our PA's tomorrow. Informed patient if his symptoms get worse to call 911. Patient verbalized understanding.

## 2017-10-03 NOTE — Progress Notes (Deleted)
Cardiology Office Note    Date:  10/03/2017   ID:  Arav Bannister, DOB 07/29/1956, MRN [id number]  PCP:  [name], MD  Cardiologist:  Dr. Irish Lack  Chief Complaint: Chest pain   History of Present Illness:   Yassir Enis is a 62 y.o. male CAD s/p anterior STEMI (underwent emergent CABG in 6433), Chronic systolic CHF, Stroke added for chest pain.   He has had multiple episodes of chest pain since CABG leading to multiple caths. Concern for vasospasm. Amlodipine was increased. He required severeal doses of Morphine which helped the pain as well.  Last seen by Dr. Irish Lack 06/2017 who has reviewed his films. "His graft to the RCA does have some narrowing. It is not clear to me that this is buildup of debris in the graft but perhaps, just the morphology of the vein that was used. Regardless, his proximal RCA would be amenable to stenting,and this would likely be a more durable long-term revascularization compared to the existing vein graft. He has flow into the native diagonal, in which the SVG to diagonal is occluded. He has both native and LIMA graft flow to the LAD". If anginal type chest pain, would strongly consider cath with PCI of the native RCA, given the condition of the SVG to RCA.  He added to my schedule for chest pain.         Past Medical History:  Diagnosis Date  . Anxiety   . Basal cell carcinoma (BCC) of forehead   . CAD (coronary artery disease)    a. 10/2015 ant STEMI >> LHC with 3 v CAD; oLAD tx with POBA >> emergent CABG.  . Chronic systolic CHF (congestive heart failure) ([place])   . Depression   . Dyspnea   . Esophageal reflux    eosinophil esophagitis  . Family history of adverse reaction to anesthesia    "sister has PONV" (06/21/2017)  . Former tobacco use   . Gout   . Hepatitis C    "treated and cured" (06/21/2017)  . High cholesterol   . Hypertension   . Ischemic cardiomyopathy    a. EF 25-30% at intraop TEE 4/17  //  b. Limited Echo 5/17  - EF 45-50%, mild ant HK  . Migraine    "3-4/yr" (06/21/2017)  . Myocardial infarction ([place]) 10/2015  . Sinus bradycardia    a. HR dropping into 40s in 02/2016 -> BB reduced.  . Stroke ([place]) 10/2016   "small one; sometimes my memory/cognitive issues" (06/21/2017)  . Symptomatic hypotension    a. 02/2016 ER visit -> meds reduced.    Past Surgical History:  Procedure Laterality Date  . BASAL CELL CARCINOMA EXCISION     "forehead  . CARDIAC CATHETERIZATION N/A 11/28/2015   Procedure: Left Heart Cath and Coronary Angiography;  Surgeon: [name], MD;  Location: [facility];  Service: Cardiovascular;  Laterality: N/A;  . CARDIAC CATHETERIZATION N/A 11/28/2015   Procedure: Coronary Balloon Angioplasty;  Surgeon: [name], MD;  Location: [facility];  Service: Cardiovascular;  Laterality: N/A;  ostial LAD  . CARDIAC CATHETERIZATION N/A 11/28/2015   Procedure: Coronary/Graft Angiography;  Surgeon: [name], MD;  Location: [facility];  Service: Cardiovascular;  Laterality: N/A;  coronaries only   . CARDIAC CATHETERIZATION N/A 04/21/2016   Procedure: Left Heart Cath and Coronary Angiography;  Surgeon: Wellington Hampshire, MD;  Location: [facility];  Service: Cardiovascular;  Laterality: N/A;  . CARDIAC  CATHETERIZATION N/A 06/14/2016   Procedure: Left Heart Cath and Cors/Grafts Angiography;  Surgeon: Lorretta Harp, MD;  Location: [facility];  Service: Cardiovascular;  Laterality: N/A;  . CARDIAC CATHETERIZATION N/A 09/08/2016   Procedure: Left Heart Cath and Cors/Grafts Angiography;  Surgeon: Wellington Hampshire, MD;  Location: [facility];  Service: Cardiovascular;  Laterality: N/A;  . CARDIAC CATHETERIZATION    . CORONARY ARTERY BYPASS GRAFT N/A 11/28/2015   Procedure: CORONARY ARTERY BYPASS GRAFTING (CABG) TIMES FIVE USING LEFT INTERNAL MAMMARY ARTERY AND RIGHT GREATER SAPHENOUS,VIEN HARVEATED BY ENDOVIEN, INTRAOPPRATIVE TEE;   Surgeon: Gaye Pollack, MD;  Location: [facility];  Service: Open Heart Surgery;  Laterality: N/A;  . HUMERUS SURGERY Right 1969   "tumor inside bone; filled it w/bone chips"  . LEFT HEART CATH AND CORS/GRAFTS ANGIOGRAPHY N/A 03/11/2017   Procedure: Left Heart Cath and Cors/Grafts Angiography;  Surgeon: Leonie Man, MD;  Location: [facility];  Service: Cardiovascular;  Laterality: N/A;  . PERIPHERAL VASCULAR CATHETERIZATION N/A 06/14/2016   Procedure: Lower Extremity Angiography;  Surgeon: Lorretta Harp, MD;  Location: [facility];  Service: Cardiovascular;  Laterality: N/A;    Current Medications: Prior to Admission medications   Medication Sig Start Date End Date Taking? Authorizing Provider  amLODipine (NORVASC) 10 MG tablet Take 1 tablet (10 mg total) by mouth daily. 06/22/17   Strader, Fransisco Hertz, PA-C  aspirin 81 MG tablet Take 81 mg by mouth daily.    [provider]  atorvastatin (LIPITOR) 80 MG tablet Take 1 tablet (80 mg total) by mouth daily. 08/16/17   [name], MD  chlordiazePOXIDE (LIBRIUM) 10 MG capsule Take 10 mg by mouth daily as needed for anxiety.     [provider]  clopidogrel (PLAVIX) 75 MG tablet take 1 tablet by mouth once daily 01/25/17   [name] T, PA-C  ezetimibe (ZETIA) 10 MG tablet Take 1 tablet (10 mg total) by mouth daily. 08/12/16 06/21/17  [name], MD  FLUoxetine (PROZAC) 20 MG capsule Take 40 mg by mouth daily.  03/15/17   [provider]  fluticasone (FLONASE) 50 MCG/ACT nasal spray Place 1 spray into both nostrils daily. 05/26/17   [provider]  Icosapent Ethyl (VASCEPA) 1 g CAPS Take 4 g by mouth daily. 08/11/17   [name], MD  lisinopril (PRINIVIL,ZESTRIL) 5 MG tablet Take 5 mg by mouth daily. May take 1 additional tablet once daily for SBP greater than 150 once hour after taking daily dose    [provider]  metoprolol succinate (TOPROL-XL) 25 MG 24  hr tablet Take 1 tablet (25 mg total) by mouth daily. 06/23/17   Strader, Fransisco Hertz, PA-C  Multiple Vitamins-Minerals (ONE-A-DAY MENS 50+ ADVANTAGE PO) Take 1 tablet by mouth daily.     [provider]  nitroGLYCERIN (NITROSTAT) 0.4 MG SL tablet Place 1 tablet (0.4 mg total) under the tongue every 5 (five) minutes as needed for chest pain. 3 DOSE MAX 06/22/17   Strader, [place] M, PA-C  olopatadine (PATANOL) 0.1 % ophthalmic solution Place 1 drop as needed into both eyes. 05/26/17   [provider]  pantoprazole (PROTONIX) 40 MG tablet Take 40 mg by mouth daily. 06/19/17   [provider]  ranitidine (ZANTAC) 150 MG tablet Take 150 mg by mouth daily as needed for heartburn.     [provider]  ranolazine (RANEXA) 1000 MG SR tablet Take 1 tablet (1,000 mg  total) by mouth 2 (two) times daily. 03/13/17   Rise Mu, PA-C  traZODone (DESYREL) 50 MG tablet Take 50 mg by mouth at bedtime as needed for sleep.  12/08/16   [provider]    Allergies:   Prednisone; Tetanus toxoids; Wellbutrin [bupropion]; and Chantix [varenicline]   Social History   Socioeconomic History  . Marital status: Married    Spouse name: [name]  . Number of children: 3  . Years of education: College  . Highest education level: Not on file  Social Needs  . Financial resource strain: Not on file  . Food insecurity - worry: Not on file  . Food insecurity - inability: Not on file  . Transportation needs - medical: Not on file  . Transportation needs - non-medical: Not on file  Occupational History  . Occupation: Scientist, research (physical sciences): SELF-EMPLOYED  Tobacco Use  . Smoking status: Former Smoker    Packs/day: 0.75    Years: 44.00    Pack years: 33.00    Types: Cigarettes    Last attempt to quit: 11/28/2015    Years since quitting: 1.8  . Smokeless tobacco: Never Used  Substance and Sexual Activity  . Alcohol use: Yes    Comment: 06/21/2017 "I'll have a few drinks q  couple months"  . Drug use: Yes    Comment: 06/21/2017 "nothing since the 1980s"  . Sexual activity: Not Currently  Other Topics Concern  . Not on file  Social History Narrative   Patient lives at home with his spouse.   Caffeine Use: yes     Family History:  The patient's family history includes Heart Problems in his father; Heart attack in his maternal grandmother and paternal uncle; Heart attack (age of onset: 62) in his father; Hypertension in his brother; Lung cancer in his mother; Stroke in his father and maternal grandmother. ***  ROS:   Please see the history of present illness.    ROS All other systems reviewed and are negative.   PHYSICAL EXAM:   VS:  There were no vitals taken for this visit.   GEN: Well nourished, well developed, in no acute distress  HEENT: normal  Neck: no JVD, carotid bruits, or masses Cardiac: ***RRR; no murmurs, rubs, or gallops,no edema  Respiratory:  clear to auscultation bilaterally, normal work of breathing GI: soft, nontender, nondistended, + BS MS: no deformity or atrophy  Skin: warm and dry, no rash Neuro:  Alert and Oriented x 3, Strength and sensation are intact Psych: euthymic mood, full affect  Wt Readings from Last 3 Encounters:  07/12/17 191 lb 9.6 oz (86.9 kg)  06/21/17 185 lb 12.8 oz (84.3 kg)  03/18/17 187 lb 6.4 oz (85 kg)      Studies/Labs Reviewed:   EKG:  EKG is ordered today.  The ekg ordered today demonstrates ***  Recent Labs: 01/04/2017: B Natriuretic Peptide 110.6 03/10/2017: ALT 27 06/21/2017: Magnesium 2.1; TSH 0.817 06/22/2017: BUN 19; Creatinine, Ser 1.25; Hemoglobin 13.0; Platelets 181; Potassium 3.6; Sodium 141   Lipid Panel    Component Value Date/Time   CHOL 76 06/22/2017 0256   CHOL 130 09/30/2016 0939   TRIG 133 06/22/2017 0256   HDL 35 (L) 06/22/2017 0256   HDL 55 09/30/2016 0939   CHOLHDL 2.2 06/22/2017 0256   VLDL 27 06/22/2017 0256   LDLCALC 14 06/22/2017 0256   LDLCALC 54 09/30/2016 0939      Additional studies/ records that were reviewed today include:  Left Heart Cath and Cors/Grafts Angiography  02/2017  Conclusion     Angiographically very similar to images from January 2018  Ost LM lesion, 60 %stenosed.  Ost LAD to Prox LAD lesion, 75 %stenosed. Mid LAD lesion, 55 %stenosed.  LIMA graft was visualized by angiography and is normal in caliber and anatomically normal.  SVG-Diag1- Known to be occluded 100%  Ost Ramus lesion, 90 %stenosed. Ramus lesion, 75 %stenosed.  Ost Cx lesion, 80 %stenosed. Prox Cx to Mid Cx lesion, 50 %stenosed.  Seq SVG- Ramus & OM2 graft was visualized by angiography and is large. Prox Graft to Mid Graft lesion, 40 %stenosed. Mid Graft to Dist Graft lesion, 40 %stenosed.  Ost RCA to Prox RCA lesion, 50 %stenosed. Dist RCA lesion, 50 %stenosed. RPDA lesion, 40 %stenosed - at the insertion of SVG  SVG-rPDA: Origin to Prox Graft lesion, 70 %stenosed. The lesion is identical to January 2018. The flow in the graft is reversed.  The left ventricular ejection fraction is 55-65% by visual estimate. The left ventricular systolic function is normal.  LV end diastolic pressure is normal.   Her take a few percentage points, the patient's angiographic images are almost identical to January 2018. No obvious culprit lesion to explain the patient's symptoms.  The SVG-RPDA does have stable proximal roughly 70% smooth lesion, but there is competitive flow in the PDA with brisk flow down the native PDA.  The fact that this is similar to January 2018 would mean that the patient is not likely to be asymptomatic from this.  Would consider microvascular ischemia versus non-anginal chest pain. The patient has had several cardiac catheterizations for similar symptoms that have been roughly similar. Would recommend some type of noninvasive evaluation to have is a new baseline based upon these angiographic Images that can be used as a benchmark for future  evaluations.  Standard post radial cath care.  Return to nursing unit for ongoing care.  Diagnostic Diagram         ASSESSMENT & PLAN:    1. Chest pain with hx of CAD s/p CABG - Per Dr. Hassell Done last note, consider PCI of native RCA if anginal type chest pain.   2. HLD  3. HTN    Medication Adjustments/Labs and Tests Ordered: Current medicines are reviewed at length with the patient today.  Concerns regarding medicines are outlined above.  Medication changes, Labs and Tests ordered today are listed in the Patient Instructions below. There are no Patient Instructions on file for this visit.   Jarrett Soho, [place]  10/03/2017 1:29 PM    [facility] [address], [place], [place]  [address] Phone: [phone]; Fax: [phone]

## 2017-10-03 NOTE — Telephone Encounter (Signed)
Pt c/o of Chest Pain: STAT if CP now or developed within 24 hours  1. Are you having CP right now? Yes  2. Are you experiencing any other symptoms (ex. SOB, nausea, vomiting, sweating)? Pain radiates to his right  3. How long have you been experiencing CP? For about two weeks  4. Is your CP continuous or coming and going? Coning and going   5. Have you taken Nitroglycerin? Yes, three times  ?

## 2017-10-03 NOTE — Telephone Encounter (Signed)
Will forward to Dr. Irish Lack for further advisement.

## 2017-10-03 NOTE — Telephone Encounter (Signed)
F/U call:  Attempted to route call to [facility] st and NL triage

## 2017-10-04 ENCOUNTER — Ambulatory Visit: Payer: Self-pay | Admitting: Physician Assistant

## 2017-10-04 ENCOUNTER — Encounter (HOSPITAL_COMMUNITY): Payer: Self-pay | Admitting: *Deleted

## 2017-10-04 ENCOUNTER — Emergency Department (HOSPITAL_COMMUNITY): Payer: BC Managed Care – PPO

## 2017-10-04 ENCOUNTER — Observation Stay (HOSPITAL_COMMUNITY)
Admission: EM | Admit: 2017-10-04 | Discharge: 2017-10-06 | Disposition: A | Discharge status: Home / Self Care | Payer: BC Managed Care – PPO | Attending: Interventional Cardiology | Admitting: Interventional Cardiology

## 2017-10-04 ENCOUNTER — Other Ambulatory Visit: Payer: Self-pay

## 2017-10-04 DIAGNOSIS — I110 Hypertensive heart disease with heart failure: Secondary | ICD-10-CM | POA: Insufficient documentation

## 2017-10-04 DIAGNOSIS — I509 Heart failure, unspecified: Secondary | ICD-10-CM | POA: Diagnosis not present

## 2017-10-04 DIAGNOSIS — Z888 Allergy status to other drugs, medicaments and biological substances status: Secondary | ICD-10-CM | POA: Insufficient documentation

## 2017-10-04 DIAGNOSIS — Z9889 Other specified postprocedural states: Secondary | ICD-10-CM | POA: Insufficient documentation

## 2017-10-04 DIAGNOSIS — E782 Mixed hyperlipidemia: Secondary | ICD-10-CM | POA: Diagnosis not present

## 2017-10-04 DIAGNOSIS — Z955 Presence of coronary angioplasty implant and graft: Secondary | ICD-10-CM

## 2017-10-04 DIAGNOSIS — F419 Anxiety disorder, unspecified: Secondary | ICD-10-CM | POA: Diagnosis not present

## 2017-10-04 DIAGNOSIS — Z85828 Personal history of other malignant neoplasm of skin: Secondary | ICD-10-CM | POA: Insufficient documentation

## 2017-10-04 DIAGNOSIS — Z87891 Personal history of nicotine dependence: Secondary | ICD-10-CM | POA: Insufficient documentation

## 2017-10-04 DIAGNOSIS — I25110 Atherosclerotic heart disease of native coronary artery with unstable angina pectoris: Principal | ICD-10-CM | POA: Insufficient documentation

## 2017-10-04 DIAGNOSIS — I25700 Atherosclerosis of coronary artery bypass graft(s), unspecified, with unstable angina pectoris: Secondary | ICD-10-CM | POA: Insufficient documentation

## 2017-10-04 DIAGNOSIS — Z823 Family history of stroke: Secondary | ICD-10-CM | POA: Insufficient documentation

## 2017-10-04 DIAGNOSIS — I5022 Chronic systolic (congestive) heart failure: Secondary | ICD-10-CM | POA: Diagnosis not present

## 2017-10-04 DIAGNOSIS — Z7902 Long term (current) use of antithrombotics/antiplatelets: Secondary | ICD-10-CM | POA: Insufficient documentation

## 2017-10-04 DIAGNOSIS — R42 Dizziness and giddiness: Secondary | ICD-10-CM | POA: Diagnosis not present

## 2017-10-04 DIAGNOSIS — I4581 Long QT syndrome: Secondary | ICD-10-CM | POA: Insufficient documentation

## 2017-10-04 DIAGNOSIS — Z7982 Long term (current) use of aspirin: Secondary | ICD-10-CM | POA: Insufficient documentation

## 2017-10-04 DIAGNOSIS — Z79899 Other long term (current) drug therapy: Secondary | ICD-10-CM | POA: Diagnosis not present

## 2017-10-04 DIAGNOSIS — F329 Major depressive disorder, single episode, unspecified: Secondary | ICD-10-CM | POA: Insufficient documentation

## 2017-10-04 DIAGNOSIS — I255 Ischemic cardiomyopathy: Secondary | ICD-10-CM | POA: Diagnosis not present

## 2017-10-04 DIAGNOSIS — I259 Chronic ischemic heart disease, unspecified: Secondary | ICD-10-CM | POA: Insufficient documentation

## 2017-10-04 DIAGNOSIS — Z887 Allergy status to serum and vaccine status: Secondary | ICD-10-CM | POA: Insufficient documentation

## 2017-10-04 DIAGNOSIS — I252 Old myocardial infarction: Secondary | ICD-10-CM | POA: Diagnosis not present

## 2017-10-04 DIAGNOSIS — R079 Chest pain, unspecified: Secondary | ICD-10-CM | POA: Diagnosis present

## 2017-10-04 DIAGNOSIS — K210 Gastro-esophageal reflux disease with esophagitis: Secondary | ICD-10-CM | POA: Diagnosis not present

## 2017-10-04 DIAGNOSIS — R001 Bradycardia, unspecified: Secondary | ICD-10-CM | POA: Diagnosis not present

## 2017-10-04 DIAGNOSIS — Z951 Presence of aortocoronary bypass graft: Secondary | ICD-10-CM | POA: Diagnosis not present

## 2017-10-04 DIAGNOSIS — Z8673 Personal history of transient ischemic attack (TIA), and cerebral infarction without residual deficits: Secondary | ICD-10-CM | POA: Insufficient documentation

## 2017-10-04 DIAGNOSIS — I10 Essential (primary) hypertension: Secondary | ICD-10-CM | POA: Diagnosis not present

## 2017-10-04 DIAGNOSIS — E785 Hyperlipidemia, unspecified: Secondary | ICD-10-CM | POA: Insufficient documentation

## 2017-10-04 DIAGNOSIS — Z8249 Family history of ischemic heart disease and other diseases of the circulatory system: Secondary | ICD-10-CM | POA: Insufficient documentation

## 2017-10-04 DIAGNOSIS — G8929 Other chronic pain: Secondary | ICD-10-CM | POA: Diagnosis not present

## 2017-10-04 DIAGNOSIS — I25119 Atherosclerotic heart disease of native coronary artery with unspecified angina pectoris: Secondary | ICD-10-CM

## 2017-10-04 DIAGNOSIS — Z801 Family history of malignant neoplasm of trachea, bronchus and lung: Secondary | ICD-10-CM | POA: Insufficient documentation

## 2017-10-04 LAB — COMPREHENSIVE METABOLIC PANEL
ALT: 21 U/L (ref 17–63)
AST: 24 U/L (ref 15–41)
Albumin: 4 g/dL (ref 3.5–5.0)
Alkaline Phosphatase: 60 U/L (ref 38–126)
Anion gap: 11 (ref 5–15)
BUN: 15 mg/dL (ref 6–20)
CO2: 27 mmol/L (ref 22–32)
Calcium: 9.6 mg/dL (ref 8.9–10.3)
Chloride: 102 mmol/L (ref 101–111)
Creatinine, Ser: 1.15 mg/dL (ref 0.61–1.24)
GFR calc Af Amer: >60 mL/min (ref >60)
GFR calc non Af Amer: >60 mL/min (ref >60)
Glucose, Bld: 98 mg/dL (ref 65–99)
Potassium: 5.1 mmol/L (ref 3.5–5.1)
Sodium: 140 mmol/L (ref 135–145)
Total Bilirubin: 0.7 mg/dL (ref 0.3–1.2)
Total Protein: 6.8 g/dL (ref 6.5–8.1)

## 2017-10-04 LAB — CBC WITH DIFFERENTIAL/PLATELET
Basophils Absolute: 0 10*3/uL (ref 0.0–0.1)
Basophils Relative: 0 %
Eosinophils Absolute: 0.1 10*3/uL (ref 0.0–0.7)
Eosinophils Relative: 2 %
HCT: 43.6 % (ref 39.0–52.0)
Hemoglobin: 14.1 g/dL (ref 13.0–17.0)
Lymphocytes Relative: 24 %
Lymphs Abs: 1.9 10*3/uL (ref 0.7–4.0)
MCH: 30.3 pg (ref 26.0–34.0)
MCHC: 32.3 g/dL (ref 30.0–36.0)
MCV: 93.8 fL (ref 78.0–100.0)
Monocytes Absolute: 0.4 10*3/uL (ref 0.1–1.0)
Monocytes Relative: 5 %
Neutro Abs: 5.3 10*3/uL (ref 1.7–7.7)
Neutrophils Relative %: 69 %
Platelets: 220 10*3/uL (ref 150–400)
RBC: 4.65 MIL/uL (ref 4.22–5.81)
RDW: 13.4 % (ref 11.5–15.5)
WBC: 7.7 10*3/uL (ref 4.0–10.5)

## 2017-10-04 LAB — I-STAT TROPONIN, ED
Troponin i, poc: 0 ng/mL (ref 0.00–0.08)
Troponin i, poc: 0 ng/mL (ref 0.00–0.08)

## 2017-10-04 LAB — TROPONIN I: Troponin I: <0.03 ng/mL (ref <0.03)

## 2017-10-04 LAB — BRAIN NATRIURETIC PEPTIDE: B Natriuretic Peptide: 57 pg/mL (ref 0.0–100.0)

## 2017-10-04 LAB — PROTIME-INR
INR: 0.97
Prothrombin Time: 12.8 seconds (ref 11.4–15.2)

## 2017-10-04 LAB — CBG MONITORING, ED: Glucose-Capillary: 86 mg/dL (ref 65–99)

## 2017-10-04 MED ORDER — ACETAMINOPHEN 650 MG RE SUPP: 650.0000 mg | Freq: Four times a day (QID) | RECTAL | Status: DC | PRN

## 2017-10-04 MED ORDER — MORPHINE SULFATE (PF) 4 MG/ML IV SOLN
4.0000 mg | Freq: Once | INTRAVENOUS | Status: AC
  Administered 2017-10-04: 4 mg via INTRAVENOUS
  Filled 2017-10-04: qty 1

## 2017-10-04 MED ORDER — AMLODIPINE BESYLATE 10 MG PO TABS
10.0000 mg | ORAL_TABLET | Freq: Every day | ORAL | Status: DC
  Administered 2017-10-05 – 2017-10-06 (×2): 10 mg via ORAL
  Filled 2017-10-04 (×2): qty 1

## 2017-10-04 MED ORDER — CLOPIDOGREL BISULFATE 75 MG PO TABS
75.0000 mg | ORAL_TABLET | Freq: Every day | ORAL | Status: DC
  Administered 2017-10-04: 75 mg via ORAL
  Filled 2017-10-04: qty 1

## 2017-10-04 MED ORDER — CHLORDIAZEPOXIDE HCL 5 MG PO CAPS
10.0000 mg | ORAL_CAPSULE | Freq: Every day | ORAL | Status: DC | PRN
  Administered 2017-10-06: 10 mg via ORAL
  Filled 2017-10-04: qty 2

## 2017-10-04 MED ORDER — FLUOXETINE HCL 20 MG PO CAPS
40.0000 mg | ORAL_CAPSULE | Freq: Every day | ORAL | Status: DC
  Administered 2017-10-04 – 2017-10-06 (×3): 40 mg via ORAL
  Filled 2017-10-04 (×3): qty 2

## 2017-10-04 MED ORDER — NITROGLYCERIN 0.4 MG SL SUBL: 0.4000 mg | SUBLINGUAL_TABLET | SUBLINGUAL | Status: DC | PRN

## 2017-10-04 MED ORDER — EZETIMIBE 10 MG PO TABS
10.0000 mg | ORAL_TABLET | Freq: Every day | ORAL | Status: DC
  Administered 2017-10-04 – 2017-10-06 (×3): 10 mg via ORAL
  Filled 2017-10-04 (×3): qty 1

## 2017-10-04 MED ORDER — ENOXAPARIN SODIUM 40 MG/0.4ML ~~LOC~~ SOLN
40.0000 mg | SUBCUTANEOUS | Status: DC
  Administered 2017-10-04: 40 mg via SUBCUTANEOUS
  Filled 2017-10-04 (×2): qty 0.4

## 2017-10-04 MED ORDER — INFLUENZA VAC SPLIT QUAD 0.5 ML IM SUSY: 0.5000 mL | PREFILLED_SYRINGE | INTRAMUSCULAR | Status: DC

## 2017-10-04 MED ORDER — ACETAMINOPHEN 325 MG PO TABS: 650.0000 mg | ORAL_TABLET | Freq: Four times a day (QID) | ORAL | Status: DC | PRN

## 2017-10-04 MED ORDER — METOPROLOL SUCCINATE ER 25 MG PO TB24
25.0000 mg | ORAL_TABLET | Freq: Every day | ORAL | Status: DC
  Administered 2017-10-05 – 2017-10-06 (×2): 25 mg via ORAL
  Filled 2017-10-04 (×2): qty 1

## 2017-10-04 MED ORDER — RANOLAZINE ER 500 MG PO TB12
1000.0000 mg | ORAL_TABLET | Freq: Two times a day (BID) | ORAL | Status: DC
  Administered 2017-10-04 – 2017-10-06 (×4): 1000 mg via ORAL
  Filled 2017-10-04 (×4): qty 2

## 2017-10-04 MED ORDER — ASPIRIN 81 MG PO CHEW: 324.0000 mg | CHEWABLE_TABLET | Freq: Once | ORAL | Status: DC

## 2017-10-04 MED ORDER — TRAZODONE HCL 50 MG PO TABS
50.0000 mg | ORAL_TABLET | Freq: Every evening | ORAL | Status: DC | PRN
  Administered 2017-10-05: 50 mg via ORAL
  Filled 2017-10-04: qty 1

## 2017-10-04 MED ORDER — ASPIRIN EC 81 MG PO TBEC
81.0000 mg | DELAYED_RELEASE_TABLET | Freq: Every day | ORAL | Status: DC
  Administered 2017-10-04: 81 mg via ORAL
  Filled 2017-10-04: qty 1

## 2017-10-04 MED ORDER — ATORVASTATIN CALCIUM 80 MG PO TABS
80.0000 mg | ORAL_TABLET | Freq: Every day | ORAL | Status: DC
  Administered 2017-10-04 – 2017-10-06 (×3): 80 mg via ORAL
  Filled 2017-10-04 (×3): qty 1

## 2017-10-04 MED ORDER — PANTOPRAZOLE SODIUM 40 MG PO TBEC
40.0000 mg | DELAYED_RELEASE_TABLET | Freq: Every day | ORAL | Status: DC
  Administered 2017-10-04 – 2017-10-06 (×3): 40 mg via ORAL
  Filled 2017-10-04 (×3): qty 1

## 2017-10-04 MED ORDER — LISINOPRIL 5 MG PO TABS: 5.0000 mg | ORAL_TABLET | Freq: Every day | ORAL | Status: DC

## 2017-10-04 MED ORDER — MORPHINE SULFATE (PF) 4 MG/ML IV SOLN
2.0000 mg | INTRAVENOUS | Status: DC | PRN
  Administered 2017-10-04 – 2017-10-05 (×3): 2 mg via INTRAVENOUS
  Filled 2017-10-04 (×3): qty 1

## 2017-10-04 NOTE — H&P (Signed)
[name] is an 62 y.o. male.   Primary Cardiologist:  Casandra Doffing, MD PMD: [name], MD Chief Complaint: Chest pain  HPI: [name] is a 62 y.o. male with a history of CHF, CAD, s/p anterior STEMI and emergent CABG in 2017, CVA, Hepatitis C treated who presents today for evaluation of chest pain.  He reports a 2 week history of intermittent chest pain that occurs spontaneously and resolves with nitroglycerin.  It usually takes 2-3 nitroglycerin tablets for pain to resolve.  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  He denies diaphoresis, nausea or vomiting.  Past Medical History:  Diagnosis Date  . Anxiety   . Basal cell carcinoma (BCC) of forehead   . CAD (coronary artery disease)    a. 10/2015 ant STEMI >> LHC with 3 v CAD; oLAD tx with POBA >> emergent CABG.  . Chronic systolic CHF (congestive heart failure) ([place])   . Depression   . Dyspnea   . Esophageal reflux    eosinophil esophagitis  . Family history of adverse reaction to anesthesia    "sister has PONV" (06/21/2017)  . Former tobacco use   . Gout   . Hepatitis C    "treated and cured" (06/21/2017)  . High cholesterol   . Hypertension   . Ischemic cardiomyopathy    a. EF 25-30% at intraop TEE 4/17  //  b. Limited Echo 5/17 - EF 45-50%, mild ant HK  . Migraine    "3-4/yr" (06/21/2017)  . Myocardial infarction ([place]) 10/2015  . Sinus bradycardia    a. HR dropping into 40s in 02/2016 -> BB reduced.  . Stroke ([place]) 10/2016   "small one; sometimes my memory/cognitive issues" (06/21/2017)  . Symptomatic hypotension    a. 02/2016 ER visit -> meds reduced.    Past Surgical History:  Procedure Laterality Date  . BASAL CELL CARCINOMA EXCISION     "forehead  . CARDIAC CATHETERIZATION N/A 11/28/2015   Procedure: Left Heart Cath and Coronary  Angiography;  Surgeon: [name], MD;  Location: [facility];  Service: Cardiovascular;  Laterality: N/A;  . CARDIAC CATHETERIZATION N/A 11/28/2015   Procedure: Coronary Balloon Angioplasty;  Surgeon: [name], MD;  Location: [facility];  Service: Cardiovascular;  Laterality: N/A;  ostial LAD  . CARDIAC CATHETERIZATION N/A 11/28/2015   Procedure: Coronary/Graft Angiography;  Surgeon: [name], MD;  Location: [facility];  Service: Cardiovascular;  Laterality: N/A;  coronaries only   . CARDIAC CATHETERIZATION N/A 04/21/2016   Procedure: Left Heart Cath and Coronary Angiography;  Surgeon: Wellington Hampshire, MD;  Location: [facility];  Service: Cardiovascular;  Laterality: N/A;  . CARDIAC CATHETERIZATION N/A 06/14/2016   Procedure: Left Heart Cath and Cors/Grafts Angiography;  Surgeon: Lorretta Harp, MD;  Location: [facility];  Service: Cardiovascular;  Laterality: N/A;  . CARDIAC CATHETERIZATION N/A 09/08/2016   Procedure: Left Heart Cath and Cors/Grafts Angiography;  Surgeon: Wellington Hampshire, MD;  Location: [facility];  Service: Cardiovascular;  Laterality: N/A;  . CARDIAC CATHETERIZATION    . CORONARY ARTERY BYPASS GRAFT N/A 11/28/2015   Procedure: CORONARY ARTERY BYPASS GRAFTING (CABG) TIMES FIVE USING LEFT INTERNAL MAMMARY ARTERY AND RIGHT GREATER SAPHENOUS,VIEN HARVEATED BY ENDOVIEN, INTRAOPPRATIVE TEE;  Surgeon: Gaye Pollack, MD;  Location: [facility];  Service: Open Heart Surgery;  Laterality: N/A;  . HUMERUS SURGERY Right 1969   "tumor inside bone; filled it w/bone chips"  . LEFT HEART CATH AND CORS/GRAFTS ANGIOGRAPHY N/A 03/11/2017   Procedure: Left Heart Cath and Cors/Grafts Angiography;  Surgeon: Leonie Man, MD;  Location: [facility];  Service: Cardiovascular;  Laterality: N/A;  . PERIPHERAL VASCULAR CATHETERIZATION N/A 06/14/2016   Procedure: Lower Extremity Angiography;  Surgeon: Lorretta Harp, MD;   Location: [facility];  Service: Cardiovascular;  Laterality: N/A;    Family History  Problem Relation Age of Onset  . Lung cancer Mother   . Heart Problems Father   . Heart attack Father 23  . Stroke Father   . Heart attack Maternal Grandmother   . Stroke Maternal Grandmother   . Heart attack Paternal Uncle   . Hypertension Brother   . Autoimmune disease Neg Hx    Social History:  reports that he quit smoking about 22 months ago. His smoking use included cigarettes. He has a 33.00 pack-year smoking history. he has never used smokeless tobacco. He reports that he drinks alcohol. He reports that he uses drugs.  Allergies:  Allergies  Allergen Reactions  . Prednisone Other (See Comments)    States that this med makes him "crazy"  . Tetanus Toxoids Swelling and Other (See Comments)    Fever, Swelling of the arm   . Wellbutrin [Bupropion] Other (See Comments)    Crazy thoughts, nightmares  . Chantix [Varenicline] Other (See Comments)    Dreams     (Not in a hospital admission)  Results for orders placed or performed during the hospital encounter of 10/04/17 (from the past 48 hour(s))  Comprehensive metabolic panel     Status: None   Collection Time: 10/04/17 11:24 AM  Result Value Ref Range   Sodium 140 135 - 145 mmol/L   Potassium 5.1 3.5 - 5.1 mmol/L    Comment: SLIGHT HEMOLYSIS   Chloride 102 101 - 111 mmol/L   CO2 27 22 - 32 mmol/L   Glucose, Bld 98 65 - 99 mg/dL   BUN 15 6 - 20 mg/dL   Creatinine, Ser 1.15 0.61 - 1.24 mg/dL   Calcium 9.6 8.9 - 10.3 mg/dL   Total Protein 6.8 6.5 - 8.1 g/dL   Albumin 4.0 3.5 - 5.0 g/dL   AST 24 15 - 41 U/L   ALT 21 17 - 63 U/L   Alkaline Phosphatase 60 38 - 126 U/L   Total Bilirubin 0.7 0.3 - 1.2 mg/dL   GFR calc non Af Amer >60 >60 mL/min   GFR calc Af Amer >60 >60 mL/min    Comment: (NOTE) The eGFR has been calculated using the CKD EPI equation. This calculation has not been validated in all clinical  situations. eGFR's persistently <60 mL/min signify possible Chronic Kidney Disease.    Anion gap 11 5 - 15    Comment: Performed at [facility] [address] [place],  [address]  CBC with Differential/Platelet     Status: None   Collection Time: 10/04/17 11:24 AM  Result Value Ref Range   WBC 7.7 4.0 - 10.5 K/uL   RBC 4.65 4.22 - 5.81 MIL/uL   Hemoglobin 14.1 13.0 - 17.0 g/dL   HCT 43.6 39.0 - 52.0 %   MCV 93.8 78.0 - 100.0 fL   MCH 30.3 26.0 - 34.0 pg   MCHC 32.3 30.0 - 36.0 g/dL   RDW 13.4  11.5 - 15.5 %   Platelets 220 150 - 400 K/uL   Neutrophils Relative % 69 %   Neutro Abs 5.3 1.7 - 7.7 K/uL   Lymphocytes Relative 24 %   Lymphs Abs 1.9 0.7 - 4.0 K/uL   Monocytes Relative 5 %   Monocytes Absolute 0.4 0.1 - 1.0 K/uL   Eosinophils Relative 2 %   Eosinophils Absolute 0.1 0.0 - 0.7 K/uL   Basophils Relative 0 %   Basophils Absolute 0.0 0.0 - 0.1 K/uL    Comment: Performed at [facility] [address], [place], [place] [address]  Protime-INR     Status: None   Collection Time: 10/04/17 11:24 AM  Result Value Ref Range   Prothrombin Time 12.8 11.4 - 15.2 seconds   INR 0.97     Comment: Performed at [facility] [address], [place], [place] [address]  I-stat troponin, ED     Status: None   Collection Time: 10/04/17 11:48 AM  Result Value Ref Range   Troponin i, poc 0.00 0.00 - 0.08 ng/mL   Comment 3            Comment: Due to the release kinetics of cTnI, a negative result within the first hours of the onset of symptoms does not rule out myocardial infarction with certainty. If myocardial infarction is still suspected, repeat the test at appropriate intervals.   CBG monitoring, ED     Status: None   Collection Time: 10/04/17 11:49 AM  Result Value Ref Range   Glucose-Capillary 86 65 - 99 mg/dL  I-stat troponin, ED (0, 3)     Status: None   Collection Time: 10/04/17  3:30 PM  Result Value Ref Range   Troponin i, poc 0.00 0.00 -  0.08 ng/mL   Comment 3            Comment: Due to the release kinetics of cTnI, a negative result within the first hours of the onset of symptoms does not rule out myocardial infarction with certainty. If myocardial infarction is still suspected, repeat the test at appropriate intervals.    Dg Chest Portable 1 View  Result Date: 10/04/2017 CLINICAL DATA:  Chest pain EXAM: PORTABLE CHEST 1 VIEW COMPARISON:  06/21/2017 FINDINGS: Prior CABG. Heart and mediastinal contours are within normal limits. No focal opacities or effusions. No acute bony abnormality. IMPRESSION: No active cardiopulmonary disease. Electronically Signed   By: Rolm Baptise M.D.   On: 10/04/2017 12:05    ROS: Review of Systems  Constitutional: Negative for diaphoresis.  Respiratory: Negative for shortness of breath.   Cardiovascular: Positive for chest pain. Negative for palpitations, orthopnea and leg swelling.  Gastrointestinal: Negative for nausea and vomiting.    OBJECTIVE:   Vitals:   Vitals:   10/04/17 1445 10/04/17 1500 10/04/17 1515 10/04/17 1545  BP: 126/80 126/82 121/89 125/78  Pulse: (!) 50 (!) 49 (!) 51 (!) 50  Resp: 17 12 12 14   Temp:      TempSrc:      SpO2: 100% 99% 98% 98%  Weight:      Height:       I&O's:  No intake or output data in the 24 hours ending 10/04/17 1606  PHYSICAL EXAM General: Well developed, well nourished, in no acute distress Head:   Normal cephalic and atramatic  Lungs:   Clear bilaterally to auscultation. Heart:  HRRR S1 S2  No JVD.   Abdomen: abdomen soft and non-tender Msk:  Back normal,  Normal strength and tone for age. Extremities:  No edema.   Neuro: Alert and oriented. Psych:  Normal affect, responds appropriately  LABS: Basic Metabolic Panel: Recent Labs    10/04/17 1124  NA 140  K 5.1  CL 102  CO2 27  GLUCOSE 98  BUN 15  CREATININE 1.15  CALCIUM 9.6   Liver Function Tests: Recent Labs    10/04/17 1124  AST 24  ALT 21  ALKPHOS 60  BILITOT  0.7  PROT 6.8  ALBUMIN 4.0   No results for input(s): LIPASE, AMYLASE in the last 72 hours. CBC: Recent Labs    10/04/17 1124  WBC 7.7  NEUTROABS 5.3  HGB 14.1  HCT 43.6  MCV 93.8  PLT 220   Cardiac Enzymes: No results for input(s): CKTOTAL, CKMB, CKMBINDEX, TROPONINI in the last 72 hours. BNP: Invalid input(s): POCBNP D-Dimer: No results for input(s): DDIMER in the last 72 hours. Hemoglobin A1C: No results for input(s): HGBA1C in the last 72 hours. Fasting Lipid Panel: No results for input(s): CHOL, HDL, LDLCALC, TRIG, CHOLHDL, LDLDIRECT in the last 72 hours. Thyroid Function Tests: No results for input(s): TSH, T4TOTAL, T3FREE, THYROIDAB in the last 72 hours.  Invalid input(s): FREET3 Anemia Panel: No results for input(s): VITAMINB12, FOLATE, FERRITIN, TIBC, IRON, RETICCTPCT in the last 72 hours. Coag Panel:   Lab Results  Component Value Date   INR 0.97 10/04/2017   INR 0.97 03/11/2017   INR 0.99 11/09/2016    Profile: Patient has a history of CABG in 2017 and since has had multiple caths due to recurrent chest pain.  Last cath 02/2017.  He was admitted on 05/2017 for chest pain and had a nuclear stress test that showed intermediate risk because of prior anteroapical myocardial infarction.  At that time amlodipine was increased and metoprolol succinate was started to help improve anti-anginal benefits.   Assessment/Plan Chest pain Patient presents today with 2 week history of intermittent chest pain.  POC troponin negative x 2.  EKG without ischemic changes.  Most recent cath showed RCA with some narrowing and proximal RCA would be amenable to stenting.  Plan for Cath this week.  Currently on amlodipine, asa, Plavix and metoprolol.  Will continue these medications.  No IV heparin unless troponin positive. - amlodipine, asa, Plavix, metoprolol and ranexa - trend troponins - morphine   Hyperlipidemia Continuing atorvastatin and zetia  Essential  Hypertension Patient currently takes amlodipine '10mg'$  daily, lisinopril '5mg'$  daily, metoprolol succinate 25 mg daily.  Currently normotensive -continue with home BP meds  Hx of anxiety and depression Continue home fluoxetine and librium prn daily.  Continue home trazadone.   Signed: [facility] PGY-2  Boyd Kerbs 10/04/2017, 4:06 PM   I have examined the patient and reviewed assessment and plan and discussed with patient.  Agree with above as stated.  Some atypical features of his chest pain.  He has had chronic chest pain since his CABG.  He has had several caths without need for PCI in the past.    I personally reviewed his cath films.  His SVG to RCA is small and he has a focal proximal RCA lesion.  We have discussed fixing the native RA as a target for PCI.  We will plan for cath in AM with possible PCI of RCA.  No heparin unless his enzymes turn positive.  He continues to have intermittent pain without any objective evidence of ischemia.  Troponins negative.  ECG  normal.  Pain resolves with morphine.  There has been concern on past admissions of narcotic seeking behavior.  He is fine to go to telemetry given that he has had chronic chest pain for over a year.    Larae Grooms

## 2017-10-04 NOTE — Telephone Encounter (Signed)
Will forward to [place] PA. Patient has appointment with him today.

## 2017-10-04 NOTE — ED Provider Notes (Signed)
[facility] EMERGENCY DEPARTMENT Provider Note   CSN: [id number] Arrival date & time: 10/04/17  1108     History   Chief Complaint Chief Complaint  Patient presents with  . Chest Pain    HPI [name] is a 62 y.o. male with a history of CHF, STEMI s/p CABGx5 in 10/2015, Ischemic cardiomyopathy, Stroke, Hepatitis C treated and cured 10/18 who presents today for evaluation of chest pain.  He reports that over the past 1-2 weeks he has been having intermittent chest pain that starts without identifiable provoking factor that has been relieved with his home nitro, presents today for evaluation of chest pain that began at around 8 AM.  He reports that he was at home in his office when he began having sudden onset of substernal chest pain.  His pain is on the left side of his chest, radiating to the right.  He denies recent illness, no fevers or chills.  He has taken 3 or 4 nitro prior to arrival without significant relief.  He ports that he has had 4 baby aspirin today.  He reports that he had an appointment with his cardiologist Dr. Irish Lack for evaluation.  He denies diaphoresis, N/V/D.      HPI  Past Medical History:  Diagnosis Date  . Anxiety   . Basal cell carcinoma (BCC) of forehead   . CAD (coronary artery disease)    a. 10/2015 ant STEMI >> LHC with 3 v CAD; oLAD tx with POBA >> emergent CABG.  . Chronic systolic CHF (congestive heart failure) ([place])   . Depression   . Dyspnea   . Esophageal reflux    eosinophil esophagitis  . Family history of adverse reaction to anesthesia    "sister has PONV" (06/21/2017)  . Former tobacco use   . Gout   . Hepatitis C    "treated and cured" (06/21/2017)  . High cholesterol   . Hypertension   . Ischemic cardiomyopathy    a. EF 25-30% at intraop TEE 4/17  //  b. Limited Echo 5/17 - EF 45-50%, mild ant HK  . Migraine    "3-4/yr" (06/21/2017)  . Myocardial infarction ([place]) 10/2015  . Sinus bradycardia    a. HR  dropping into 40s in 02/2016 -> BB reduced.  . Stroke ([place]) 10/2016   "small one; sometimes my memory/cognitive issues" (06/21/2017)  . Symptomatic hypotension    a. 02/2016 ER visit -> meds reduced.    Patient Active Problem List   Diagnosis Date Noted  . Chest pain 03/10/2017  . Family hx-stroke 11/10/2016  . Stroke-like episode ([place]) - R brain, s/p tPA 11/09/2016  . Unstable angina ([place]) 09/07/2016  . Claudication of both lower extremities ([place])   . Pure hypercholesterolemia   . Tobacco abuse disorder   . Coronary artery disease involving coronary bypass graft of native heart with unstable angina pectoris ([place])   . Chest pain at rest 06/10/2016  . Abnormal nuclear stress test - HIGH RISK 04/20/2016  . Old MI (myocardial infarction)   . Essential hypertension 02/26/2016  . Ischemic cardiomyopathy 12/25/2015  . Dyslipidemia, goal LDL below 70 12/25/2015  . STEMI (ST elevation myocardial infarction) ([place]) 11/28/2015  . 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 Coronary artery disease involving native coronary artery of native heart with angina pectoris ([place]) 11/28/2015  . 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 Acute MI anterior wall first episode care [facility])   . Chest pain with high risk  for cardiac etiology 03/07/2015  . Dysphagia 03/07/2015  . Gout attack 03/07/2015  . Mixed bipolar I disorder ([place]) 03/07/2015  . Fibromyalgia 07/09/2014  . Gout 07/09/2014  . Anxiety 07/09/2014  . Depression 07/09/2014  . Hepatitis C 11/20/2012  . Eosinophilic esophagitis 40/98/1191    Past Surgical History:  Procedure Laterality Date  . BASAL CELL CARCINOMA EXCISION     "forehead  . CARDIAC CATHETERIZATION N/A 11/28/2015   Procedure: Left Heart Cath and Coronary Angiography;  Surgeon: [name], MD;  Location: [facility] CV LAB;  Service: Cardiovascular;  Laterality: N/A;  . CARDIAC CATHETERIZATION N/A 11/28/2015   Procedure: Coronary Balloon Angioplasty;  Surgeon: [name], MD;  Location: [facility] CV LAB;  Service: Cardiovascular;  Laterality: N/A;  ostial LAD  . CARDIAC CATHETERIZATION N/A 11/28/2015   Procedure: Coronary/Graft Angiography;  Surgeon: [name], MD;  Location: [facility] CV LAB;  Service: Cardiovascular;  Laterality: N/A;  coronaries only   . CARDIAC CATHETERIZATION N/A 04/21/2016   Procedure: Left Heart Cath and Coronary Angiography;  Surgeon: Wellington Hampshire, MD;  Location: [place] CV LAB;  Service: Cardiovascular;  Laterality: N/A;  . CARDIAC CATHETERIZATION N/A 06/14/2016   Procedure: Left Heart Cath and Cors/Grafts Angiography;  Surgeon: Lorretta Harp, MD;  Location: [facility] CV LAB;  Service: Cardiovascular;  Laterality: N/A;  . CARDIAC CATHETERIZATION N/A 09/08/2016   Procedure: Left Heart Cath and Cors/Grafts Angiography;  Surgeon: Wellington Hampshire, MD;  Location: [facility] CV LAB;  Service: Cardiovascular;  Laterality: N/A;  . CARDIAC CATHETERIZATION    . CORONARY ARTERY BYPASS GRAFT N/A 11/28/2015   Procedure: CORONARY ARTERY BYPASS GRAFTING (CABG) TIMES FIVE USING LEFT INTERNAL MAMMARY ARTERY AND RIGHT GREATER SAPHENOUS,VIEN HARVEATED BY ENDOVIEN, INTRAOPPRATIVE TEE;  Surgeon: Gaye Pollack, MD;  Location: [facility];  Service: Open Heart Surgery;  Laterality: N/A;  . HUMERUS SURGERY Right 1969   "tumor inside bone; filled it w/bone chips"  . LEFT HEART CATH AND CORS/GRAFTS ANGIOGRAPHY N/A 03/11/2017   Procedure: Left Heart Cath and Cors/Grafts Angiography;  Surgeon: Leonie Man, MD;  Location: [place] CV LAB;  Service: Cardiovascular;  Laterality: N/A;  . PERIPHERAL VASCULAR CATHETERIZATION N/A 06/14/2016   Procedure: Lower Extremity Angiography;  Surgeon: Lorretta Harp, MD;  Location: [place] [facility] CV LAB;  Service: Cardiovascular;  Laterality: N/A;       Home Medications    Prior to Admission medications   Medication Sig Start Date End Date Taking? Authorizing Provider  acetaminophen  (TYLENOL) 500 MG tablet Take 500 mg by mouth every 6 (six) hours as needed for mild pain.   Yes [provider]  amLODipine (NORVASC) 10 MG tablet Take 1 tablet (10 mg total) by mouth daily. 06/22/17  Yes Strader, Fransisco Hertz, PA-C  aspirin 81 MG tablet Take 81 mg by mouth daily.   Yes [provider]  atorvastatin (LIPITOR) 80 MG tablet Take 1 tablet (80 mg total) by mouth daily. 08/16/17  Yes [name], MD  chlordiazePOXIDE (LIBRIUM) 10 MG capsule Take 10 mg by mouth daily as needed for anxiety.    Yes [provider]  clopidogrel (PLAVIX) 75 MG tablet take 1 tablet by mouth once daily 01/25/17  Yes Weaver, Nicki Reaper T, PA-C  ezetimibe (ZETIA) 10 MG tablet Take 1 tablet (10 mg total) by mouth daily. 08/12/16 10/04/17 Yes [name], MD  FLUoxetine (PROZAC) 40 MG capsule Take 40 mg by mouth daily. 08/16/17  Yes  [provider]  fluticasone (FLONASE) 50 MCG/ACT nasal spray Place 1 spray into both nostrils daily. 05/26/17  Yes [provider]  lisinopril (PRINIVIL,ZESTRIL) 5 MG tablet Take 5 mg by mouth daily. May take 1 additional tablet once daily for SBP greater than 150 once hour after taking daily dose   Yes [provider]  metoprolol succinate (TOPROL-XL) 25 MG 24 hr tablet Take 1 tablet (25 mg total) by mouth daily. 06/23/17  Yes Strader, [place], PA-C  Multiple Vitamins-Minerals (ONE-A-DAY MENS 50+ ADVANTAGE PO) Take 1 tablet by mouth daily.    Yes [provider]  nitroGLYCERIN (NITROSTAT) 0.4 MG SL tablet Place 1 tablet (0.4 mg total) under the tongue every 5 (five) minutes as needed for chest pain. 3 DOSE MAX 06/22/17  Yes Strader, [place] M, PA-C  olopatadine (PATANOL) 0.1 % ophthalmic solution Place 1 drop as needed into both eyes. 05/26/17  Yes [provider]  pantoprazole (PROTONIX) 40 MG tablet Take 40 mg by mouth daily. 06/19/17  Yes [provider]  ranitidine (ZANTAC) 150 MG tablet Take  150 mg by mouth daily as needed for heartburn.    Yes [provider]  ranolazine (RANEXA) 1000 MG SR tablet Take 1 tablet (1,000 mg total) by mouth 2 (two) times daily. 03/13/17  Yes Dunn, Areta Haber, PA-C  traZODone (DESYREL) 50 MG tablet Take 50 mg by mouth at bedtime as needed for sleep.  12/08/16  Yes [provider]  Icosapent Ethyl (VASCEPA) 1 g CAPS Take 4 g by mouth daily. 08/11/17   [name], MD    Family History Family History  Problem Relation Age of Onset  . Lung cancer Mother   . Heart Problems Father   . Heart attack Father 6  . Stroke Father   . Heart attack Maternal Grandmother   . Stroke Maternal Grandmother   . Heart attack Paternal Uncle   . Hypertension Brother   . Autoimmune disease Neg Hx     Social History Social History   Tobacco Use  . Smoking status: Former Smoker    Packs/day: 0.75    Years: 44.00    Pack years: 33.00    Types: Cigarettes    Last attempt to quit: 11/28/2015    Years since quitting: 1.8  . Smokeless tobacco: Never Used  Substance Use Topics  . Alcohol use: Yes    Comment: 06/21/2017 "I'll have a few drinks q couple months"  . Drug use: Yes    Comment: 06/21/2017 "nothing since the 1980s"     Allergies   Prednisone; Tetanus toxoids; Wellbutrin [bupropion]; and Chantix [varenicline]   Review of Systems Review of Systems  Constitutional: Negative for chills and fever.  Respiratory: Positive for chest tightness.   Cardiovascular: Positive for chest pain. Negative for palpitations and leg swelling.  Gastrointestinal: Negative for abdominal pain, nausea and vomiting.  Skin: Negative for color change, rash and wound.  Neurological: Negative for syncope, weakness and headaches.  Psychiatric/Behavioral: Negative for confusion.       Reports is in the middle of a deep depression.   All other systems reviewed and are negative.    Physical Exam Updated Vital Signs BP 125/78   Pulse (!) 50   Temp 98.4  F (36.9 C) (Oral)   Resp 14   Ht 5\' 9"  (1.753 m)   Wt 86.2 kg (190 lb)   SpO2 98%   BMI 28.06 kg/m   Physical Exam  Constitutional: He is oriented  to person, place, and time. He appears well-developed and well-nourished.  HENT:  Head: Normocephalic and atraumatic.  Eyes: Conjunctivae are normal.  Neck: Normal range of motion. Neck supple. No JVD present.  Cardiovascular: Regular rhythm, intact distal pulses and normal pulses. Bradycardia present.  No murmur heard. Pulmonary/Chest: Effort normal and breath sounds normal. No respiratory distress. He has no decreased breath sounds. He has no wheezes. He has no rales.  Abdominal: Soft. Bowel sounds are normal. He exhibits no distension. There is no tenderness.  Musculoskeletal: He exhibits no edema.       Right lower leg: Normal.       Left lower leg: Normal.  Neurological: He is alert and oriented to person, place, and time.  Skin: Skin is warm and dry.  Psychiatric: He has a normal mood and affect. His behavior is normal.  Nursing note and vitals reviewed.    ED Treatments / Results  Labs (all labs ordered are listed, but only abnormal results are displayed) Labs Reviewed  COMPREHENSIVE METABOLIC PANEL  CBC WITH DIFFERENTIAL/PLATELET  PROTIME-INR  BRAIN NATRIURETIC PEPTIDE  CBC  I-STAT TROPONIN, ED  CBG MONITORING, ED  I-STAT TROPONIN, ED  I-STAT TROPONIN, ED    EKG  EKG Interpretation  Date/Time:  Tuesday October 04 2017 11:30:41 EST Ventricular Rate:  51 PR Interval:    QRS Duration: 94 QT Interval:  446 QTC Calculation: 411 R Axis:   82 Text Interpretation:  Sinus rhythm Borderline right axis deviation Low voltage, precordial leads No significant change since last tracing Abnormal ekg Confirmed by Carmin Muskrat [phone]) on 10/04/2017 4:36:07 PM       Radiology Dg Chest Portable 1 View  Result Date: 10/04/2017 CLINICAL DATA:  Chest pain EXAM: PORTABLE CHEST 1 VIEW COMPARISON:  06/21/2017 FINDINGS: Prior  CABG. Heart and mediastinal contours are within normal limits. No focal opacities or effusions. No acute bony abnormality. IMPRESSION: No active cardiopulmonary disease. Electronically Signed   By: Rolm Baptise M.D.   On: 10/04/2017 12:05    Procedures Procedures (including critical care time)  Medications Ordered in ED Medications  nitroGLYCERIN (NITROSTAT) SL tablet 0.4 mg (not administered)  morphine 4 MG/ML injection 4 mg (4 mg Intravenous Given 10/04/17 1151)     Initial Impression / Assessment and Plan / ED Course  I have reviewed the triage vital signs and the nursing notes.  Pertinent labs & imaging results that were available during my care of the patient were reviewed by me and considered in my medical decision making (see chart for details).  Clinical Course as of Oct 04 1700  Tue Oct 04, 2017  1136 Went into room to evaluate patient, noted that right and left arm and leg leads were reversed on the 1122 EKG.  Leads fixed, remainder checked, and new EKG obtained.   [EH]  1447 Spoke with Trish from cardiology who reports cards will evaluate patient.   [EH]    Clinical Course User Index [EH] Lorin Glass, PA-C   [name] presents today for evaluation of chest pain that has been intermittent for the past 2 weeks and then constant since approximately 8am.  Initial troponin normal, EKG without acute abnormalities.  His pain was treated with morphine with improvement.  His last office visit with Dr. Irish Lack recommended cath if he had continued anginal symptoms.    Concern for cardiac etiology of Chest Pain. Cardiology has been consulted and will see patient in the ED for likely admit. Pt does not meet  criteria for CP protocol and a further evaluation is recommended. Pt has been re-evaluated prior to consult and VSS, NAD, heart RRR, lungs CTAB. No acute abnormalities found on EKG and first round of cardiac enzymes negative. This case was discussed with Dr. Laverta Baltimore who has  seen the patient and agrees with plan to admit.    Final Clinical Impressions(s) / ED Diagnoses   Final diagnoses:  Chest pain, unspecified type    ED Discharge Orders    None       Lorin Glass, Hershal Coria 10/04/17 1703    Margette Fast, MD 10/05/17 [phone]

## 2017-10-04 NOTE — ED Triage Notes (Signed)
Patient has been having increasing episodes of chest pain over the last two weeks. Pain has been resolved by nitro every time until today. This am patient took 4 nitro and 324 ASA with no resolution and called EMS.

## 2017-10-04 NOTE — Telephone Encounter (Signed)
It looks like patient just went to ER

## 2017-10-04 NOTE — ED Notes (Signed)
Pt to order his own meal tray. Menu and telephone number provided.

## 2017-10-04 NOTE — Telephone Encounter (Signed)
OK.  May need cath with me next week if he is having worsening sx.

## 2017-10-04 NOTE — ED Notes (Signed)
Report attempted x 1

## 2017-10-05 ENCOUNTER — Ambulatory Visit (HOSPITAL_COMMUNITY)
Admission: EM | Disposition: A | Discharge status: Home / Self Care | Payer: Self-pay | Source: Home / Self Care | Attending: Emergency Medicine

## 2017-10-05 ENCOUNTER — Encounter (HOSPITAL_COMMUNITY): Payer: Self-pay | Admitting: Interventional Cardiology

## 2017-10-05 DIAGNOSIS — I25110 Atherosclerotic heart disease of native coronary artery with unstable angina pectoris: Secondary | ICD-10-CM | POA: Diagnosis not present

## 2017-10-05 DIAGNOSIS — Z951 Presence of aortocoronary bypass graft: Secondary | ICD-10-CM | POA: Diagnosis not present

## 2017-10-05 DIAGNOSIS — I25700 Atherosclerosis of coronary artery bypass graft(s), unspecified, with unstable angina pectoris: Secondary | ICD-10-CM | POA: Diagnosis not present

## 2017-10-05 DIAGNOSIS — I509 Heart failure, unspecified: Secondary | ICD-10-CM | POA: Diagnosis not present

## 2017-10-05 HISTORY — PX: CORONARY STENT INTERVENTION: CATH118234

## 2017-10-05 HISTORY — PX: LEFT HEART CATH AND CORS/GRAFTS ANGIOGRAPHY: CATH118250

## 2017-10-05 LAB — BASIC METABOLIC PANEL
Anion gap: 8 (ref 5–15)
BUN: 17 mg/dL (ref 6–20)
CO2: 29 mmol/L (ref 22–32)
Calcium: 9 mg/dL (ref 8.9–10.3)
Chloride: 102 mmol/L (ref 101–111)
Creatinine, Ser: 1.1 mg/dL (ref 0.61–1.24)
GFR calc Af Amer: >60 mL/min (ref >60)
GFR calc non Af Amer: >60 mL/min (ref >60)
Glucose, Bld: 108 mg/dL — ABNORMAL HIGH (ref 65–99)
Potassium: 3.9 mmol/L (ref 3.5–5.1)
Sodium: 139 mmol/L (ref 135–145)

## 2017-10-05 LAB — POCT ACTIVATED CLOTTING TIME
Activated Clotting Time: 318 seconds
Activated Clotting Time: 186 seconds
Activated Clotting Time: 153 seconds

## 2017-10-05 LAB — TROPONIN I
Troponin I: <0.03 ng/mL (ref <0.03)
Troponin I: <0.03 ng/mL (ref <0.03)

## 2017-10-05 SURGERY — LEFT HEART CATH AND CORS/GRAFTS ANGIOGRAPHY
Anesthesia: LOCAL

## 2017-10-05 MED ORDER — HEPARIN SODIUM (PORCINE) 1000 UNIT/ML IJ SOLN
INTRAMUSCULAR | Status: DC | PRN
  Administered 2017-10-05: 9000 [IU] via INTRAVENOUS

## 2017-10-05 MED ORDER — SODIUM CHLORIDE 0.9% FLUSH
3.0000 mL | Freq: Two times a day (BID) | INTRAVENOUS | Status: DC
  Administered 2017-10-05 – 2017-10-06 (×3): 3 mL via INTRAVENOUS

## 2017-10-05 MED ORDER — HEPARIN SODIUM (PORCINE) 1000 UNIT/ML IJ SOLN
INTRAMUSCULAR | Status: AC
  Filled 2017-10-05: qty 1

## 2017-10-05 MED ORDER — SODIUM CHLORIDE 0.9% FLUSH: 3.0000 mL | Freq: Two times a day (BID) | INTRAVENOUS | Status: DC

## 2017-10-05 MED ORDER — ONDANSETRON HCL 4 MG/2ML IJ SOLN
4.0000 mg | Freq: Four times a day (QID) | INTRAMUSCULAR | Status: DC | PRN
  Administered 2017-10-06: 4 mg via INTRAVENOUS
  Filled 2017-10-05: qty 2

## 2017-10-05 MED ORDER — CLOPIDOGREL BISULFATE 75 MG PO TABS
75.0000 mg | ORAL_TABLET | Freq: Every day | ORAL | Status: DC
  Administered 2017-10-06: 09:00:00 75 mg via ORAL
  Filled 2017-10-05: qty 1

## 2017-10-05 MED ORDER — LIDOCAINE HCL (PF) 1 % IJ SOLN
INTRAMUSCULAR | Status: DC | PRN
  Administered 2017-10-05: 15 mL

## 2017-10-05 MED ORDER — SODIUM CHLORIDE 0.9 % IV SOLN: INTRAVENOUS | Status: AC

## 2017-10-05 MED ORDER — ASPIRIN 81 MG PO CHEW
81.0000 mg | CHEWABLE_TABLET | ORAL | Status: AC
  Administered 2017-10-05: 81 mg via ORAL
  Filled 2017-10-05: qty 1

## 2017-10-05 MED ORDER — CLOPIDOGREL BISULFATE 300 MG PO TABS
ORAL_TABLET | ORAL | Status: AC
  Filled 2017-10-05: qty 1

## 2017-10-05 MED ORDER — SODIUM CHLORIDE 0.9 % IV SOLN: 250.0000 mL | INTRAVENOUS | Status: DC | PRN

## 2017-10-05 MED ORDER — HYDRALAZINE HCL 20 MG/ML IJ SOLN: 5.0000 mg | INTRAMUSCULAR | Status: AC | PRN

## 2017-10-05 MED ORDER — IOHEXOL 350 MG/ML SOLN
INTRAVENOUS | Status: DC | PRN
  Administered 2017-10-05: 100 mL via INTRAVENOUS

## 2017-10-05 MED ORDER — NITROGLYCERIN 0.4 MG SL SUBL
0.4000 mg | SUBLINGUAL_TABLET | SUBLINGUAL | Status: DC | PRN
  Administered 2017-10-06: 09:00:00 0.4 mg via SUBLINGUAL
  Filled 2017-10-05: qty 1

## 2017-10-05 MED ORDER — FENTANYL CITRATE (PF) 100 MCG/2ML IJ SOLN
INTRAMUSCULAR | Status: AC
  Filled 2017-10-05: qty 2

## 2017-10-05 MED ORDER — ANGIOPLASTY BOOK
Freq: Once | Status: AC
  Administered 2017-10-05: 1
  Filled 2017-10-05: qty 1

## 2017-10-05 MED ORDER — MIDAZOLAM HCL 2 MG/2ML IJ SOLN
INTRAMUSCULAR | Status: AC
  Filled 2017-10-05: qty 2

## 2017-10-05 MED ORDER — FENTANYL CITRATE (PF) 100 MCG/2ML IJ SOLN
INTRAMUSCULAR | Status: DC | PRN
  Administered 2017-10-05 (×2): 50 ug via INTRAVENOUS

## 2017-10-05 MED ORDER — HEPARIN (PORCINE) IN NACL 2-0.9 UNIT/ML-% IJ SOLN
INTRAMUSCULAR | Status: AC
  Filled 2017-10-05: qty 1000

## 2017-10-05 MED ORDER — ASPIRIN 81 MG PO CHEW
81.0000 mg | CHEWABLE_TABLET | Freq: Every day | ORAL | Status: DC
  Administered 2017-10-06: 09:00:00 81 mg via ORAL
  Filled 2017-10-05: qty 1

## 2017-10-05 MED ORDER — NITROGLYCERIN 1 MG/10 ML FOR IR/CATH LAB
INTRA_ARTERIAL | Status: AC
  Filled 2017-10-05: qty 10

## 2017-10-05 MED ORDER — ATROPINE SULFATE 1 MG/10ML IJ SOSY
PREFILLED_SYRINGE | INTRAMUSCULAR | Status: AC
  Filled 2017-10-05: qty 10

## 2017-10-05 MED ORDER — HEPARIN (PORCINE) IN NACL 2-0.9 UNIT/ML-% IJ SOLN
INTRAMUSCULAR | Status: AC | PRN
  Administered 2017-10-05: 1000 mL

## 2017-10-05 MED ORDER — SODIUM CHLORIDE 0.9% FLUSH: 3.0000 mL | INTRAVENOUS | Status: DC | PRN

## 2017-10-05 MED ORDER — CLOPIDOGREL BISULFATE 300 MG PO TABS
ORAL_TABLET | ORAL | Status: DC | PRN
  Administered 2017-10-05: 300 mg via ORAL

## 2017-10-05 MED ORDER — FAMOTIDINE 20 MG PO TABS
20.0000 mg | ORAL_TABLET | Freq: Every day | ORAL | Status: DC | PRN
  Administered 2017-10-06: 11:00:00 20 mg via ORAL
  Filled 2017-10-05: qty 1

## 2017-10-05 MED ORDER — LABETALOL HCL 5 MG/ML IV SOLN: 10.0000 mg | INTRAVENOUS | Status: AC | PRN

## 2017-10-05 MED ORDER — IOPAMIDOL (ISOVUE-370) INJECTION 76%
INTRAVENOUS | Status: AC
  Filled 2017-10-05: qty 50

## 2017-10-05 MED ORDER — DIAZEPAM 5 MG PO TABS
5.0000 mg | ORAL_TABLET | Freq: Once | ORAL | Status: AC
  Administered 2017-10-05: 5 mg via ORAL
  Filled 2017-10-05: qty 1

## 2017-10-05 MED ORDER — FAMOTIDINE IN NACL 20-0.9 MG/50ML-% IV SOLN
INTRAVENOUS | Status: AC | PRN
  Administered 2017-10-05: 20 mg via INTRAVENOUS

## 2017-10-05 MED ORDER — LISINOPRIL 5 MG PO TABS
5.0000 mg | ORAL_TABLET | Freq: Every day | ORAL | Status: DC
  Administered 2017-10-05 – 2017-10-06 (×2): 5 mg via ORAL
  Filled 2017-10-05 (×2): qty 1

## 2017-10-05 MED ORDER — ACETAMINOPHEN 325 MG PO TABS
650.0000 mg | ORAL_TABLET | ORAL | Status: DC | PRN
  Administered 2017-10-05: 650 mg via ORAL
  Filled 2017-10-05: qty 2

## 2017-10-05 MED ORDER — FAMOTIDINE IN NACL 20-0.9 MG/50ML-% IV SOLN
INTRAVENOUS | Status: AC
  Filled 2017-10-05: qty 50

## 2017-10-05 MED ORDER — LIDOCAINE HCL (PF) 1 % IJ SOLN
INTRAMUSCULAR | Status: AC
  Filled 2017-10-05: qty 30

## 2017-10-05 MED ORDER — SODIUM CHLORIDE 0.9 % WEIGHT BASED INFUSION: 1.0000 mL/kg/h | INTRAVENOUS | Status: DC

## 2017-10-05 MED ORDER — MIDAZOLAM HCL 2 MG/2ML IJ SOLN
INTRAMUSCULAR | Status: DC | PRN
  Administered 2017-10-05: 2 mg via INTRAVENOUS
  Administered 2017-10-05 (×2): 1 mg via INTRAVENOUS

## 2017-10-05 MED ORDER — SODIUM CHLORIDE 0.9 % WEIGHT BASED INFUSION
3.0000 mL/kg/h | INTRAVENOUS | Status: DC
  Administered 2017-10-05: 3 mL/kg/h via INTRAVENOUS

## 2017-10-05 SURGICAL SUPPLY — 19 items
BALLN SAPPHIRE 3.0X15 (BALLOONS) ×2
BALLN ~~LOC~~ EMERGE MR 4.5X8 (BALLOONS) ×2
BALLOON SAPPHIRE 3.0X15 (BALLOONS) ×1 IMPLANT
BALLOON ~~LOC~~ EMERGE MR 4.5X8 (BALLOONS) ×1 IMPLANT
CATH INFINITI 5FR MULTPACK ANG (CATHETERS) ×2 IMPLANT
CATH LAUNCHER 6FR JR4 (CATHETERS) ×2 IMPLANT
COVER PRB 48X5XTLSCP FOLD TPE (BAG) ×1 IMPLANT
COVER PROBE 5X48 (BAG) ×1
KIT ENCORE 26 ADVANTAGE (KITS) ×2 IMPLANT
KIT HEART LEFT (KITS) ×2 IMPLANT
KIT HEMO VALVE WATCHDOG (MISCELLANEOUS) ×2 IMPLANT
PACK CARDIAC CATHETERIZATION (CUSTOM PROCEDURE TRAY) ×2 IMPLANT
SHEATH PINNACLE 5F 10CM (SHEATH) ×2 IMPLANT
SHEATH PINNACLE 6F 10CM (SHEATH) ×2 IMPLANT
STENT SYNERGY DES 4X16 (Permanent Stent) ×2 IMPLANT
TRANSDUCER W/STOPCOCK (MISCELLANEOUS) ×2 IMPLANT
TUBING CIL FLEX 10 FLL-RA (TUBING) ×2 IMPLANT
WIRE ASAHI PROWATER 180CM (WIRE) ×2 IMPLANT
WIRE EMERALD 3MM-J .035X150CM (WIRE) ×2 IMPLANT

## 2017-10-05 NOTE — Progress Notes (Signed)
Progress Note  Patient Name: [name] Date of Encounter: 10/05/2017  Primary Cardiologist: No primary care provider on file.   Subjective   Patient with recurrent CP last night.  Inpatient Medications    Scheduled Meds: . amLODipine  10 mg Oral Daily  . aspirin EC  81 mg Oral Daily  . atorvastatin  80 mg Oral Daily  . clopidogrel  75 mg Oral Daily  . enoxaparin (LOVENOX) injection  40 mg Subcutaneous Q24H  . ezetimibe  10 mg Oral Daily  . FLUoxetine  40 mg Oral Daily  . Influenza vac split quadrivalent PF  0.5 mL Intramuscular Tomorrow-1000  . lisinopril  5 mg Oral Daily  . metoprolol succinate  25 mg Oral Daily  . pantoprazole  40 mg Oral Daily  . ranolazine  1,000 mg Oral BID  . sodium chloride flush  3 mL Intravenous Q12H   Continuous Infusions: . sodium chloride    . sodium chloride     PRN Meds: sodium chloride, acetaminophen **OR** acetaminophen, chlordiazePOXIDE, morphine injection, nitroGLYCERIN, sodium chloride flush, traZODone   Vital Signs    Vitals:   10/04/17 2136 10/04/17 2144 10/05/17 0131 10/05/17 0542  BP:  (!) 147/73 133/74 108/66  Pulse:  (!) 50 (!) 54 65  Resp:    15  Temp:  97.7 F (36.5 C)  97.6 F (36.4 C)  TempSrc:  Oral  Oral  SpO2:  100%  99%  Weight: 194 lb (88 kg)   193 lb 3.2 oz (87.6 kg)  Height: 5\' 9"  (1.753 m)       Intake/Output Summary (Last 24 hours) at 10/05/2017 0819 Last data filed at 10/05/2017 0545 Gross per 24 hour  Intake -  Output 600 ml  Net -600 ml   Filed Weights   10/04/17 1122 10/04/17 2136 10/05/17 0542  Weight: 190 lb (86.2 kg) 194 lb (88 kg) 193 lb 3.2 oz (87.6 kg)    Telemetry    NSR - Personally Reviewed  ECG    NSR, no ST changes - Personally Reviewed  Physical Exam   GEN: No acute distress.   Neck: No JVD Cardiac: RRR, no murmurs, rubs, or gallops.  Respiratory: Clear to auscultation bilaterally. GI: Soft, nontender, non-distended  MS: No edema; No deformity. Neuro:  Nonfocal    Psych: Normal affect   Labs    Chemistry Recent Labs  Lab 10/04/17 1124 10/05/17 0320  NA 140 139  K 5.1 3.9  CL 102 102  CO2 27 29  GLUCOSE 98 108*  BUN 15 17  CREATININE 1.15 1.10  CALCIUM 9.6 9.0  PROT 6.8  --   ALBUMIN 4.0  --   AST 24  --   ALT 21  --   ALKPHOS 60  --   BILITOT 0.7  --   GFRNONAA >60 >60  GFRAA >60 >60  ANIONGAP 11 8     Hematology Recent Labs  Lab 10/04/17 1124  WBC 7.7  RBC 4.65  HGB 14.1  HCT 43.6  MCV 93.8  MCH 30.3  MCHC 32.3  RDW 13.4  PLT 220    Cardiac Enzymes Recent Labs  Lab 10/04/17 2141 10/05/17 0320  TROPONINI <0.03 <0.03    Recent Labs  Lab 10/04/17 1148 10/04/17 1530  TROPIPOC 0.00 0.00     BNP Recent Labs  Lab 10/04/17 1524  BNP 57.0     DDimer No results for input(s): DDIMER in the last 168 hours.   Radiology  Dg Chest Portable 1 View  Result Date: 10/04/2017 CLINICAL DATA:  Chest pain EXAM: PORTABLE CHEST 1 VIEW COMPARISON:  06/21/2017 FINDINGS: Prior CABG. Heart and mediastinal contours are within normal limits. No focal opacities or effusions. No acute bony abnormality. IMPRESSION: No active cardiopulmonary disease. Electronically Signed   By: Rolm Baptise M.D.   On: 10/04/2017 12:05    Cardiac Studies   Prior cath films reviewed  Patient Profile     62 y.o. male wuith CAD, unstable angina  Assessment & Plan    1) [place]: Plan for cath today.  Left radial pulse decreased.  I suspect his left radial has occluded after his prior caths.  Plan for groin approach.    COntinue aggressive secondary prevention. Cath Lab Visit (complete for each Cath Lab visit)  Clinical Evaluation Leading to the Procedure:   ACS: Yes.    Non-ACS:    Anginal Classification: CCS IV  Anti-ischemic medical therapy: Maximal Therapy (2 or more classes of medications)  Non-Invasive Test Results: No non-invasive testing performed  Prior CABG: Previous CABG        For questions or updates, please  contact [facility] Please consult [email or web address] for contact info under Cardiology/STEMI.      Signed, Larae Grooms, MD  10/05/2017, 8:19 AM

## 2017-10-05 NOTE — Progress Notes (Signed)
Site area: right groin  Site Prior to Removal:  Level 1  Pressure Applied For 20 MINUTES    Minutes Beginning at 1615  Manual:   Yes.    Patient Status During Pull:  AAO X 4  Post Pull Groin Site:  Level 0  Post Pull Instructions Given:  Yes.    Post Pull Pulses Present:  Yes.    Dressing Applied:  Yes.    Comments:  Tolerated procedure well

## 2017-10-06 ENCOUNTER — Encounter (HOSPITAL_COMMUNITY): Payer: Self-pay | Admitting: Cardiology

## 2017-10-06 DIAGNOSIS — I10 Essential (primary) hypertension: Secondary | ICD-10-CM

## 2017-10-06 DIAGNOSIS — I25110 Atherosclerotic heart disease of native coronary artery with unstable angina pectoris: Secondary | ICD-10-CM | POA: Diagnosis not present

## 2017-10-06 DIAGNOSIS — I509 Heart failure, unspecified: Secondary | ICD-10-CM | POA: Diagnosis not present

## 2017-10-06 DIAGNOSIS — E782 Mixed hyperlipidemia: Secondary | ICD-10-CM | POA: Diagnosis not present

## 2017-10-06 DIAGNOSIS — F329 Major depressive disorder, single episode, unspecified: Secondary | ICD-10-CM | POA: Diagnosis not present

## 2017-10-06 DIAGNOSIS — I25700 Atherosclerosis of coronary artery bypass graft(s), unspecified, with unstable angina pectoris: Secondary | ICD-10-CM | POA: Diagnosis not present

## 2017-10-06 DIAGNOSIS — Z951 Presence of aortocoronary bypass graft: Secondary | ICD-10-CM | POA: Diagnosis not present

## 2017-10-06 LAB — CBC
HCT: 40.5 % (ref 39.0–52.0)
Hemoglobin: 13.2 g/dL (ref 13.0–17.0)
MCH: 30.1 pg (ref 26.0–34.0)
MCHC: 32.6 g/dL (ref 30.0–36.0)
MCV: 92.3 fL (ref 78.0–100.0)
Platelets: 198 10*3/uL (ref 150–400)
RBC: 4.39 MIL/uL (ref 4.22–5.81)
RDW: 13 % (ref 11.5–15.5)
WBC: 7.4 10*3/uL (ref 4.0–10.5)

## 2017-10-06 LAB — BASIC METABOLIC PANEL
Anion gap: 10 (ref 5–15)
BUN: 19 mg/dL (ref 6–20)
CO2: 26 mmol/L (ref 22–32)
Calcium: 8.7 mg/dL — ABNORMAL LOW (ref 8.9–10.3)
Chloride: 104 mmol/L (ref 101–111)
Creatinine, Ser: 1.23 mg/dL (ref 0.61–1.24)
GFR calc Af Amer: >60 mL/min (ref >60)
GFR calc non Af Amer: >60 mL/min (ref >60)
Glucose, Bld: 98 mg/dL (ref 65–99)
Potassium: 4.4 mmol/L (ref 3.5–5.1)
Sodium: 140 mmol/L (ref 135–145)

## 2017-10-06 MED FILL — Heparin Sodium (Porcine) 2 Unit/ML in Sodium Chloride 0.9%: INTRAMUSCULAR | Qty: 1000 | Status: AC

## 2017-10-06 MED FILL — Nitroglycerin IV Soln 100 MCG/ML in D5W: INTRA_ARTERIAL | Qty: 10 | Status: AC

## 2017-10-06 NOTE — Progress Notes (Signed)
Progress Note  Patient Name: [name] Date of Encounter: 10/06/2017  Primary Cardiologist: Larae Grooms, MD   Subjective   He states he did not feel well this morning but that has resolved. Patient states that after walking for the second time around the unit he felt well without chest pain or shortness of breath.  He states he currently feels better now than he has for the past few months.  Inpatient Medications    Scheduled Meds: . amLODipine  10 mg Oral Daily  . aspirin  81 mg Oral Daily  . atorvastatin  80 mg Oral Daily  . clopidogrel  75 mg Oral Q breakfast  . enoxaparin (LOVENOX) injection  40 mg Subcutaneous Q24H  . ezetimibe  10 mg Oral Daily  . FLUoxetine  40 mg Oral Daily  . Influenza vac split quadrivalent PF  0.5 mL Intramuscular Tomorrow-1000  . lisinopril  5 mg Oral Daily  . metoprolol succinate  25 mg Oral Daily  . pantoprazole  40 mg Oral Daily  . ranolazine  1,000 mg Oral BID  . sodium chloride flush  3 mL Intravenous Q12H   Continuous Infusions: . sodium chloride     PRN Meds: sodium chloride, acetaminophen, chlordiazePOXIDE, famotidine, morphine injection, nitroGLYCERIN, ondansetron (ZOFRAN) IV, sodium chloride flush, traZODone   Vital Signs    Vitals:   10/05/17 1952 10/05/17 2000 10/06/17 0524 10/06/17 0700  BP: 123/67  107/60 125/74  Pulse: 67  (!) 56 69  Resp: 17 18 20 15   Temp: 98.2 F (36.8 C)  97.6 F (36.4 C) 97.8 F (36.6 C)  TempSrc: Oral  Oral Oral  SpO2: 100%  97% 96%  Weight:   88.9 kg (195 lb 15.8 oz)   Height:        Intake/Output Summary (Last 24 hours) at 10/06/2017 1005 Last data filed at 10/06/2017 0750 Gross per 24 hour  Intake 980 ml  Output 1550 ml  Net -570 ml   Filed Weights   10/04/17 2136 10/05/17 0542 10/06/17 0524  Weight: 88 kg (194 lb) 87.6 kg (193 lb 3.2 oz) 88.9 kg (195 lb 15.8 oz)    Telemetry    NSR - Personally Reviewed  ECG    NSR, no ST changes - Personally Reviewed  Physical Exam     GEN: No acute distress.   Neck: No JVD Cardiac: RRR, no murmurs, rubs, or gallops.  Groin site without hematoma or signs of infection Respiratory: Clear to auscultation bilaterally. GI: Soft, nontender, non-distended  MS: No edema; No deformity. Neuro:  Nonfocal  Psych: Normal affect   Labs    Chemistry Recent Labs  Lab 10/04/17 1124 10/05/17 0320 10/06/17 0317  NA 140 139 140  K 5.1 3.9 4.4  CL 102 102 104  CO2 27 29 26   GLUCOSE 98 108* 98  BUN 15 17 19   CREATININE 1.15 1.10 1.23  CALCIUM 9.6 9.0 8.7*  PROT 6.8  --   --   ALBUMIN 4.0  --   --   AST 24  --   --   ALT 21  --   --   ALKPHOS 60  --   --   BILITOT 0.7  --   --   GFRNONAA >60 >60 >60  GFRAA >60 >60 >60  ANIONGAP 11 8 10      Hematology Recent Labs  Lab 10/04/17 1124 10/06/17 0317  WBC 7.7 7.4  RBC 4.65 4.39  HGB 14.1 13.2  HCT 43.6 40.5  MCV 93.8 92.3  MCH 30.3 30.1  MCHC 32.3 32.6  RDW 13.4 13.0  PLT 220 198    Cardiac Enzymes Recent Labs  Lab 10/04/17 2141 10/05/17 0320 10/05/17 0802  TROPONINI <0.03 <0.03 <0.03    Recent Labs  Lab 10/04/17 1148 10/04/17 1530  TROPIPOC 0.00 0.00     BNP Recent Labs  Lab 10/04/17 1524  BNP 57.0     DDimer No results for input(s): DDIMER in the last 168 hours.   Radiology    Dg Chest Portable 1 View  Result Date: 10/04/2017 CLINICAL DATA:  Chest pain EXAM: PORTABLE CHEST 1 VIEW COMPARISON:  06/21/2017 FINDINGS: Prior CABG. Heart and mediastinal contours are within normal limits. No focal opacities or effusions. No acute bony abnormality. IMPRESSION: No active cardiopulmonary disease. Electronically Signed   By: Rolm Baptise M.D.   On: 10/04/2017 12:05    Cardiac Studies   Prior cath films reviewed  Patient Profile     62 y.o. male with CAD, unstable angina  Assessment & Plan    Chest pain Patient had Left heart cath yesterday.  Patient was found to have 75% stenosis of Ost RCA to Prox RCA.  DES was placed.  Patient will need  dual antiplatelet therapy for one year followed by clopidogrel likely indefinitely.  Ready for discharge today. - asa, clopidogrel - morphine   Hyperlipidemia Continuing atorvastatin and zetia  Essential Hypertension Patient currently takes amlodipine 10mg  daily, lisinopril 5mg  daily, metoprolol succinate 25 mg daily.  Currently normotensive -continue with home BP meds  Hx of anxiety and depression Continue home fluoxetine and librium prn daily.  Continue home trazadone.   For questions or updates, please contact [place] Please consult [email or web address] for contact info under Cardiology/STEMI.      Signed, Boyd Kerbs, DO  10/06/2017, 10:05 AM    I have examined the patient and reviewed assessment and plan and discussed with patient.  Agree with above as stated.  Groin site stable.  Some dizziness.  He has persistent chest pain that I do not think is cardiac.  I think he has some depression/anxiety issues that play a part as well.  Continue DAPT.  Encouraged ambulation and sitting in a chair.  I hope he will do cardiac rehab.  ECG and tele reviewed and show no active ischemia.  Plan for discharge today.   Larae Grooms

## 2017-10-06 NOTE — Progress Notes (Signed)
CARDIAC REHAB PHASE I   PRE:  Rate/Rhythm: 68 SR  BP:  Supine: 115/82  Sitting:   Standing: 106/64   SaO2:   MODE:  Ambulation: 120 ft   POST:  Rate/Rhythm: 86 SR  BP:  Supine:   Sitting: 131/74, 125/74  Standing:    SaO2: 97%RA 0805-0900 Pt c/o lightheadedness upon standing. Took BP standing at 106/64. Pt stated it felt a little better standing but when we tried to walk, he became more lightheaded and a little nauseated. Walked total 120 ft and pt assisted to sitting on side of bed. BP 131/.74.  Asked pt about CP and he stated it became a 4 after walk but was mild prior to walk. Pt made comments that he has been having problems with short term memory loss lately. Has had a stroke in the past. Notified RN of chest pressure, dizziness and nausea.. Discussed importance of plavix with stent, reviewed NTG use, and discussed CRP 2. He has attended program before. Will refer back to [facility]. Left heart healthy diet sheet. Did not give ex ed as pt did not tolerate activity well. Left pt with his RN.    Graylon Good, RN BSN  10/06/2017 8:51 AM

## 2017-10-06 NOTE — Discharge Summary (Signed)
Discharge Summary    Patient ID: [name],  MRN: [id number], DOB/AGE: [birth year] 63 y.o.  Admit date: 10/04/2017 Discharge date: 10/06/2017  Primary Care Provider: [name] Primary Cardiologist: Irish Lack  Discharge Diagnoses    Active Problems:   Coronary artery disease involving native coronary artery of native heart with unstable angina pectoris [facility])   Chest pain   Allergies Allergies  Allergen Reactions  . Prednisone Other (See Comments)    States that this med makes him "crazy"  . Tetanus Toxoids Swelling and Other (See Comments)    Fever, Swelling of the arm   . Wellbutrin [Bupropion] Other (See Comments)    Crazy thoughts, nightmares  . Chantix [Varenicline] Other (See Comments)    Dreams    Diagnostic Studies/Procedures    Cath: 10/05/17  Conclusion     Ost LAD to Prox LAD lesion is 50% stenosed.  Mid LAD lesion is 55% stenosed. LIMA to LAD is patent.  SVG to diagonal is occluded. There is no significant obstruction in the native diagonal.  Ost Cx lesion is 80% stenosed.  Prox Cx to Mid Cx lesion is 50% stenosed. SVG to OM is patent.  Dist RCA lesion is 50% stenosed.  RPDA lesion is 40% stenosed.  Ost RCA to Prox RCA lesion is 75% stenosed. Origin to Prox Graft lesion is 70% stenosed.  A drug-eluting stent was successfully placed using a STENT SYNERGY DES 4X16, postdilated to 4.5 mm.  Post intervention, there is a 0% residual stenosis.  The left ventricular systolic function is normal.  LV end diastolic pressure is normal.  The left ventricular ejection fraction is 55-65% by visual estimate.  There is no aortic valve stenosis.   Continue dual antiplatelet therapy for at least one year and, clopidogrel likely indefinitely.  Aggressive secondary prevention.    _____________   History of Present Illness     62 y.o.malewith a history of CHF, CAD, s/p anterior STEMI and emergent CABG in 2017, CVA, Hepatitis C treatedwho  presented to the ED for evaluation of chest pain.  He reported a 2 week history of intermittent chest pain that occurs spontaneously and resolved with nitroglycerin.  It usually takes 2-3 nitroglycerin tablets for pain to resolve. 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  He denied diaphoresis, nausea or vomiting. He does have a hx of chronic chest pain but given his symptoms he was admitted for further work up.   Hospital Course     His films were reviewed by Dr. Irish Lack and decision was made to take back for cath with possible PCI of the RCA. Underwent cardiac cath the following morning with successful PCI/DES to the pRCA. Plan for DAPT with ASA/plavix for at least one year and likely plavix indefinitely. Post cath labs were stable. EF noted at 55-65% via LV gram. Did have some chest pain with first walk with cardiac rehab, but able to ambulate a second time with significant improvement.   [name] was seen by Dr. Irish Lack and determined stable for discharge home. Follow up in the office has been arranged. Medications are listed below.   _____________  Discharge Vitals Blood pressure 125/74, pulse 69, temperature 97.8 F (36.6 C), temperature source Oral, resp. rate 15, height 5\' 9"  (1.753 m), weight 195 lb 15.8 oz (88.9 kg), SpO2 96 %.  Filed Weights   10/04/17 2136 10/05/17 0542 10/06/17 0524  Weight: 194 lb (88 kg) 193 lb 3.2 oz (87.6 kg) 195 lb 15.8 oz (88.9 kg)    Labs & Radiologic Studies    CBC Recent Labs    10/04/17 1124 10/06/17 0317  WBC 7.7 7.4  NEUTROABS 5.3  --   HGB 14.1 13.2  HCT 43.6 40.5  MCV 93.8 92.3  PLT 220 893   Basic Metabolic Panel Recent Labs    10/05/17 0320 10/06/17 0317  NA 139 140  K 3.9 4.4  CL 102 104  CO2 29 26  GLUCOSE 108* 98  BUN 17 19  CREATININE 1.10  1.23  CALCIUM 9.0 8.7*   Liver Function Tests Recent Labs    10/04/17 1124  AST 24  ALT 21  ALKPHOS 60  BILITOT 0.7  PROT 6.8  ALBUMIN 4.0   No results for input(s): LIPASE, AMYLASE in the last 72 hours. Cardiac Enzymes Recent Labs    10/04/17 2141 10/05/17 0320 10/05/17 0802  TROPONINI <0.03 <0.03 <0.03   BNP Invalid input(s): POCBNP D-Dimer No results for input(s): DDIMER in the last 72 hours. Hemoglobin A1C No results for input(s): HGBA1C in the last 72 hours. Fasting Lipid Panel No results for input(s): CHOL, HDL, LDLCALC, TRIG, CHOLHDL, LDLDIRECT in the last 72 hours. Thyroid Function Tests No results for input(s): TSH, T4TOTAL, T3FREE, THYROIDAB in the last 72 hours.  Invalid input(s): FREET3 _____________  Dg Chest Portable 1 View  Result Date: 10/04/2017 CLINICAL DATA:  Chest pain EXAM: PORTABLE CHEST 1 VIEW COMPARISON:  06/21/2017 FINDINGS: Prior CABG. Heart and mediastinal contours are within normal limits. No focal opacities or effusions. No acute bony abnormality. IMPRESSION: No active cardiopulmonary disease. Electronically Signed   By: Rolm Baptise M.D.   On: 10/04/2017 12:05   Disposition   Pt is being discharged home today in good condition.  Follow-up Plans & Appointments    Follow-up Information    [name], PA-C Follow up on 10/20/2017.   Specialties:  Cardiology, Radiology Why:  at 10:30am for your follow up appt.  Contact information: [address] [place] [address] [phone]          Discharge Instructions    Amb Referral to Cardiac Rehabilitation   Complete by:  As directed    Diagnosis:  Coronary Stents   Call MD for:  redness, tenderness, or signs of infection (pain, swelling, redness, odor or green/yellow discharge around incision site)   Complete by:  As directed    Diet - low sodium heart healthy   Complete by:  As directed    Discharge instructions   Complete by:  As directed    Groin  Site Care Refer to this sheet in the next few weeks. These instructions provide you with information on caring for yourself after your procedure. Your caregiver may also give you more specific instructions. Your treatment has been planned according to current medical practices, but problems sometimes occur. Call your caregiver if you have any problems or questions after your procedure. HOME CARE INSTRUCTIONS You may shower 24 hours after the procedure. Remove the bandage (dressing) and gently wash the site with plain soap and water. Gently pat the site dry.  Do not apply powder or lotion to the site.  Do not sit in a bathtub, swimming pool, or whirlpool for 5 to 7 days.  No bending, squatting, or lifting anything over 10 pounds (4.5 kg) as directed  by your caregiver.  Inspect the site at least twice daily.  Do not drive home if you are discharged the same day of the procedure. Have someone else drive you.  You may drive 24 hours after the procedure unless otherwise instructed by your caregiver.  What to expect: Any bruising will usually fade within 1 to 2 weeks.  Blood that collects in the tissue (hematoma) may be painful to the touch. It should usually decrease in size and tenderness within 1 to 2 weeks.  SEEK IMMEDIATE MEDICAL CARE IF: You have unusual pain at the groin site or down the affected leg.  You have redness, warmth, swelling, or pain at the groin site.  You have drainage (other than a small amount of blood on the dressing).  You have chills.  You have a fever or persistent symptoms for more than 72 hours.  You have a fever and your symptoms suddenly get worse.  Your leg becomes pale, cool, tingly, or numb.  You have heavy bleeding from the site. Hold pressure on the site. Marland Kitchen  PLEASE DO NOT MISS ANY DOSES OF YOUR PLAVIX!!!!! Also keep a log of you blood pressures and bring back to your follow up appt. Please call the office with any questions.   Patients taking blood thinners  should generally stay away from medicines like ibuprofen, Advil, Motrin, naproxen, and Aleve due to risk of stomach bleeding. You may take Tylenol as directed or talk to your primary doctor about alternatives.  Some studies suggest Prilosec/Omeprazole interacts with Plavix. We changed your Prilosec/Omeprazole to the equivalent dose of Protonix for less chance of interaction.   Increase activity slowly   Complete by:  As directed       Discharge Medications     Medication List    STOP taking these medications   ranitidine 150 MG tablet Commonly known as:  ZANTAC     TAKE these medications   acetaminophen 500 MG tablet Commonly known as:  TYLENOL Take 500 mg by mouth every 6 (six) hours as needed for mild pain.   amLODipine 10 MG tablet Commonly known as:  NORVASC Take 1 tablet (10 mg total) by mouth daily.   aspirin 81 MG tablet Take 81 mg by mouth daily.   atorvastatin 80 MG tablet Commonly known as:  LIPITOR Take 1 tablet (80 mg total) by mouth daily.   chlordiazePOXIDE 10 MG capsule Commonly known as:  LIBRIUM Take 10 mg by mouth daily as needed for anxiety.   clopidogrel 75 MG tablet Commonly known as:  PLAVIX take 1 tablet by mouth once daily   ezetimibe 10 MG tablet Commonly known as:  ZETIA Take 1 tablet (10 mg total) by mouth daily.   FLUoxetine 40 MG capsule Commonly known as:  PROZAC Take 40 mg by mouth daily.   fluticasone 50 MCG/ACT nasal spray Commonly known as:  FLONASE Place 1 spray into both nostrils daily.   Icosapent Ethyl 1 g Caps Commonly known as:  VASCEPA Take 4 g by mouth daily.   lisinopril 5 MG tablet Commonly known as:  PRINIVIL,ZESTRIL Take 5 mg by mouth daily. May take 1 additional tablet once daily for SBP greater than 150 once hour after taking daily dose   metoprolol succinate 25 MG 24 hr tablet Commonly known as:  TOPROL-XL Take 1 tablet (25 mg total) by mouth daily.   nitroGLYCERIN 0.4 MG SL tablet Commonly known as:   NITROSTAT Place 1 tablet (0.4 mg total) under the tongue  every 5 (five) minutes as needed for chest pain. 3 DOSE MAX   olopatadine 0.1 % ophthalmic solution Commonly known as:  PATANOL Place 1 drop as needed into both eyes.   ONE-A-DAY MENS 50+ ADVANTAGE PO Take 1 tablet by mouth daily.   pantoprazole 40 MG tablet Commonly known as:  PROTONIX Take 40 mg by mouth daily.   ranolazine 1000 MG SR tablet Commonly known as:  RANEXA Take 1 tablet (1,000 mg total) by mouth 2 (two) times daily.   traZODone 50 MG tablet Commonly known as:  DESYREL Take 50 mg by mouth at bedtime as needed for sleep.        Aspirin prescribed at discharge?  Yes High Intensity Statin Prescribed? (Lipitor 40-80mg  or Crestor 20-40mg ): Yes Beta Blocker Prescribed? Yes For EF <40%, was ACEI/ARB Prescribed? No: EF ok ADP Receptor Inhibitor Prescribed? (i.e. Plavix etc.-Includes Medically Managed Patients): Yes For EF <40%, Aldosterone Inhibitor Prescribed? No: EF ok Was EF assessed during THIS hospitalization? Yes Was Cardiac Rehab II ordered? (Included Medically managed Patients): Yes   Outstanding Labs/Studies   N/a   Duration of Discharge Encounter   Greater than 30 minutes including physician time.  Signed, Reino Bellis NP-C 10/06/2017, 11:31 AM   I have examined the patient and reviewed assessment and plan and discussed with patient.  Agree with above as stated.  Groin site stable.  Some dizziness.  He has persistent chest pain that I do not think is cardiac.  I think he has some depression/anxiety issues that play a part as well.  Continue DAPT along with aggressive secondary prevention.  Encouraged ambulation and sitting in a chair.  I hope he will do cardiac rehab.  ECG and tele reviewed and show no active ischemia.  Plan for discharge today.   Larae Grooms

## 2017-10-07 ENCOUNTER — Telehealth (HOSPITAL_COMMUNITY): Payer: Self-pay

## 2017-10-07 ENCOUNTER — Other Ambulatory Visit: Payer: Self-pay

## 2017-10-07 MED ORDER — EZETIMIBE 10 MG PO TABS: 10.0000 mg | ORAL_TABLET | Freq: Every day | ORAL | 11 refills | Status: DC

## 2017-10-07 NOTE — Telephone Encounter (Signed)
Patients insurance is active and benefits verified through [place] - No co-pay, deductible amount of $1,080/$0.00 has been met, out of pocket amount of $4,388/$0.00 has been met, 30% co-insurance, and no pre-authorization is required. Spoke with [place] reference [phone]  Patient will be contacted and scheduled after review by the RN Navigator.

## 2017-10-18 ENCOUNTER — Encounter: Payer: Self-pay | Admitting: Physician Assistant

## 2017-10-18 NOTE — Progress Notes (Deleted)
Cardiology Office Note    Date:  10/18/2017  ID:  [name], DOB 1956/02/26, MRN [id number] PCP:  Anthony Melter, MD  Cardiologist:  Dr. Irish Villa   Chief Complaint: chest pain  History of Present Illness:  [name] is a 62 y.o. male with history of CAD (STEMI 10/2015 s/p POBA of LAD with IABP and subsequent emergent CABG), CVA, HCV s/p treatment, prior tobacco abuse, GERD, anxiety, cardiomyopathy (prior EF 45-50%), tobacco abuse, baseline bradycardia who presents for f/u.  I met him in 2017. Since that time he's had several admissions - in 03/2016 for abnormal stress test and cath showing newly occluded SVG-diagonal (treated medically), 08/2016 for chest pain and repeat cath stable, 12/2016 for chest pain with addition of amlodipine and Ranexa (neg troponins/d-dimer), 02/2017 for chest pain/repeat cath stable with titration of amlodipine (symptoms possibly r/t anxiety), 05/2017 with chest pain and nonischemic nuclear stress test, then most recently 09/2017 for chest pain in which he received a DES to his proximal RCA. DAPT for at least one year was recommended with Plavix indefinitely. EF was noted to be 55-65% by LV gram. Labs otherwise showed K 4.4, Cr 1.23 (similar to prior), normal CBC, negative troponins, last LDL was 14 in 05/2017.    CAD Chronic chest pain Ischemic cardiomyopathy History of tobacco abuse    Past Medical History:  Diagnosis Date  . Anxiety   . Basal cell carcinoma (BCC) of forehead   . CAD (coronary artery disease)    a. 10/2015 ant STEMI >> LHC with 3 v CAD; oLAD tx with POBA >> emergent CABG. b. Multiple evals since that time, early graft failure of SVG-RCA by cath 03/2016. c. 2/19 PCI/DES x1 to pRCA, normal EF.  Marland Kitchen Depression   . Dyspnea   . Esophageal reflux    eosinophil esophagitis  . Family history of adverse reaction to anesthesia    "sister has PONV" (06/21/2017)  . Former tobacco use   . Gout   . Hepatitis C    "treated and cured"  (06/21/2017)  . High cholesterol   . Hypertension   . Ischemic cardiomyopathy    a. EF 25-30% at intraop TEE 4/17  //  b. Limited Echo 5/17 - EF 45-50%, mild ant HK. c. EF 55-65% by cath 09/2017.  . Migraine    "3-4/yr" (06/21/2017)  . Myocardial infarction ([place]) 10/2015  . Sinus bradycardia    a. HR dropping into 40s in 02/2016 -> BB reduced.  . Stroke ([place]) 10/2016   "small one; sometimes my memory/cognitive issues" (06/21/2017)  . Symptomatic hypotension    a. 02/2016 ER visit -> meds reduced.    Past Surgical History:  Procedure Laterality Date  . BASAL CELL CARCINOMA EXCISION     "forehead  . CARDIAC CATHETERIZATION N/A 11/28/2015   Procedure: Left Heart Cath and Coronary Angiography;  Surgeon: Anthony Booze, MD;  Location: [facility];  Service: Cardiovascular;  Laterality: N/A;  . CARDIAC CATHETERIZATION N/A 11/28/2015   Procedure: Coronary Balloon Angioplasty;  Surgeon: Anthony Booze, MD;  Location: [facility];  Service: Cardiovascular;  Laterality: N/A;  ostial LAD  . CARDIAC CATHETERIZATION N/A 11/28/2015   Procedure: Coronary/Graft Angiography;  Surgeon: Anthony Booze, MD;  Location: [facility];  Service: Cardiovascular;  Laterality: N/A;  coronaries only   . CARDIAC CATHETERIZATION N/A 04/21/2016   Procedure: Left Heart Cath and Coronary Angiography;  Surgeon: Anthony Hampshire, MD;  Location: [facility];  Service: Cardiovascular;  Laterality: N/A;  . CARDIAC CATHETERIZATION N/A 06/14/2016   Procedure: Left Heart Cath and Cors/Grafts Angiography;  Surgeon: Anthony Harp, MD;  Location: [facility];  Service: Cardiovascular;  Laterality: N/A;  . CARDIAC CATHETERIZATION N/A 09/08/2016   Procedure: Left Heart Cath and Cors/Grafts Angiography;  Surgeon: Anthony Hampshire, MD;  Location: [facility];  Service: Cardiovascular;  Laterality: N/A;  . CARDIAC CATHETERIZATION    . CORONARY ARTERY BYPASS GRAFT N/A 11/28/2015    Procedure: CORONARY ARTERY BYPASS GRAFTING (CABG) TIMES FIVE USING LEFT INTERNAL MAMMARY ARTERY AND RIGHT GREATER SAPHENOUS,VIEN HARVEATED BY ENDOVIEN, INTRAOPPRATIVE TEE;  Surgeon: Anthony Pollack, MD;  Location: [facility];  Service: Open Heart Surgery;  Laterality: N/A;  . CORONARY STENT INTERVENTION N/A 10/05/2017   Procedure: CORONARY STENT INTERVENTION;  Surgeon: Anthony Booze, MD;  Location: [facility];  Service: Cardiovascular;  Laterality: N/A;  . HUMERUS SURGERY Right 1969   "tumor inside bone; filled it w/bone chips"  . LEFT HEART CATH AND CORS/GRAFTS ANGIOGRAPHY N/A 03/11/2017   Procedure: Left Heart Cath and Cors/Grafts Angiography;  Surgeon: Anthony Man, MD;  Location: [facility];  Service: Cardiovascular;  Laterality: N/A;  . LEFT HEART CATH AND CORS/GRAFTS ANGIOGRAPHY N/A 10/05/2017   Procedure: LEFT HEART CATH AND CORS/GRAFTS ANGIOGRAPHY;  Surgeon: Anthony Booze, MD;  Location: [facility];  Service: Cardiovascular;  Laterality: N/A;  . PERIPHERAL VASCULAR CATHETERIZATION N/A 06/14/2016   Procedure: Lower Extremity Angiography;  Surgeon: Anthony Harp, MD;  Location: [facility];  Service: Cardiovascular;  Laterality: N/A;    Current Medications: No outpatient medications have been marked as taking for the 10/20/17 encounter (Appointment) with Anthony Pitter, PA-C.   ***   Allergies:   Prednisone; Tetanus toxoids; Wellbutrin [bupropion]; and Chantix [varenicline]   Social History   Socioeconomic History  . Marital status: Married    Spouse name: [name]  . Number of children: 3  . Years of education: College  . Highest education level: Not on file  Social Needs  . Financial resource strain: Not on file  . Food insecurity - worry: Not on file  . Food insecurity - inability: Not on file  . Transportation needs - medical: Not on file  . Transportation needs - non-medical: Not on file  Occupational History  . Occupation:  Scientist, research (physical sciences): SELF-EMPLOYED  Tobacco Use  . Smoking status: Former Smoker    Packs/day: 0.75    Years: 44.00    Pack years: 33.00    Types: Cigarettes    Last attempt to quit: 11/28/2015    Years since quitting: 1.8  . Smokeless tobacco: Never Used  Substance and Sexual Activity  . Alcohol use: Yes    Comment: 06/21/2017 "I'll have a few drinks q couple months"  . Drug use: Yes    Comment: 06/21/2017 "nothing since the 1980s"  . Sexual activity: Not Currently  Other Topics Concern  . Not on file  Social History Narrative   Patient lives at home with his spouse.   Caffeine Use: yes     Family History:  Family History  Problem Relation Age of Onset  . Lung cancer Mother   . Heart Problems Father   . Heart attack Father 29  . Stroke Father   . Heart failure Father   . Heart attack Maternal Grandmother   . Stroke Maternal Grandmother   . Heart attack Paternal  Uncle   . Hypertension Brother   . Autoimmune disease Neg Hx    ***  ROS:   Please see the history of present illness. Otherwise, review of systems is positive for ***.  All other systems are reviewed and otherwise negative.    PHYSICAL EXAM:   VS:  There were no vitals taken for this visit.  BMI: There is no height or weight on file to calculate BMI. GEN: Well nourished, well developed, in no acute distress  HEENT: normocephalic, atraumatic Neck: no JVD, carotid bruits, or masses Cardiac: ***RRR; no murmurs, rubs, or gallops, no edema  Respiratory:  clear to auscultation bilaterally, normal work of breathing GI: soft, nontender, nondistended, + BS MS: no deformity or atrophy  Skin: warm and dry, no rash Neuro:  Alert and Oriented x 3, Strength and sensation are intact, follows commands Psych: euthymic mood, full affect  Wt Readings from Last 3 Encounters:  10/06/17 195 lb 15.8 oz (88.9 kg)  07/12/17 191 lb 9.6 oz (86.9 kg)  06/21/17 185 lb 12.8 oz (84.3 kg)      Studies/Labs Reviewed:    EKG:  EKG was ordered today and personally reviewed by me and demonstrates *** EKG was not ordered today.***  Recent Labs: 06/21/2017: Magnesium 2.1; TSH 0.817 10/04/2017: ALT 21; B Natriuretic Peptide 57.0 10/06/2017: BUN 19; Creatinine, Ser 1.23; Hemoglobin 13.2; Platelets 198; Potassium 4.4; Sodium 140   Lipid Panel    Component Value Date/Time   CHOL 76 06/22/2017 0256   CHOL 130 09/30/2016 0939   TRIG 133 06/22/2017 0256   HDL 35 (L) 06/22/2017 0256   HDL 55 09/30/2016 0939   CHOLHDL 2.2 06/22/2017 0256   VLDL 27 06/22/2017 0256   LDLCALC 14 06/22/2017 0256   LDLCALC 54 09/30/2016 0939    Additional studies/ records that were reviewed today include: Summarized above.***    ASSESSMENT & PLAN:   1. ***  Disposition: F/u with ***   Medication Adjustments/Labs and Tests Ordered: Current medicines are reviewed at length with the patient today.  Concerns regarding medicines are outlined above. Medication changes, Labs and Tests ordered today are summarized above and listed in the Patient Instructions accessible in Encounters.   Signed, Anthony Pitter, PA-C  10/18/2017 5:04 PM    [facility] [address], [place], [place]  [address] Phone: [phone]; Fax: [phone]

## 2017-10-20 ENCOUNTER — Ambulatory Visit: Payer: Self-pay | Admitting: Physician Assistant

## 2017-10-29 IMAGING — CR DG CHEST 2V
2 series · 2 of 2 positions shown · non-contrast
Comparison: 12/01/2015. 11/28/2015. 03/07/2015 05/13/2014.
11/18/2012.

CLINICAL DATA: CABG.

EXAM:
CHEST  2 VIEW

[w chest pa]
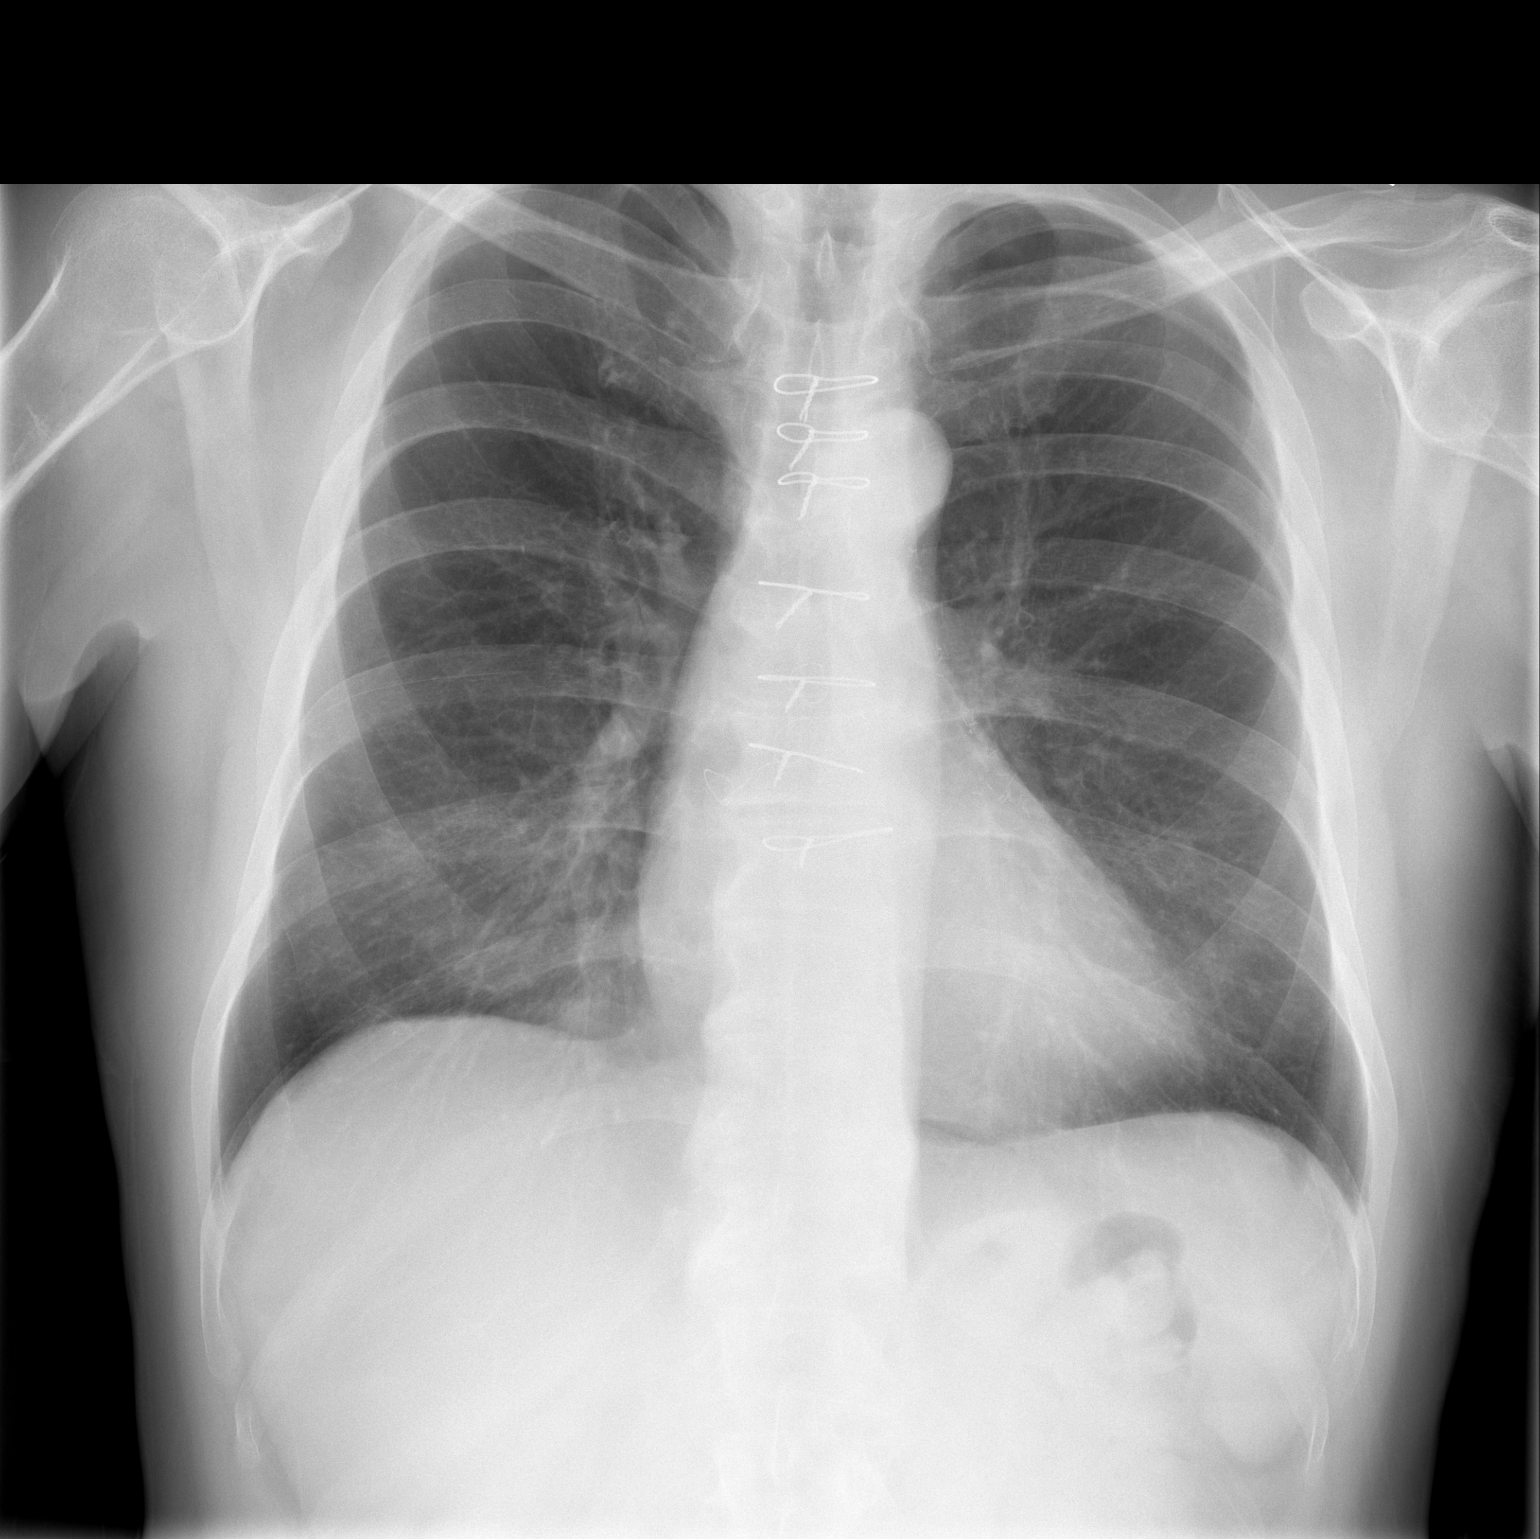

[w chest lat]
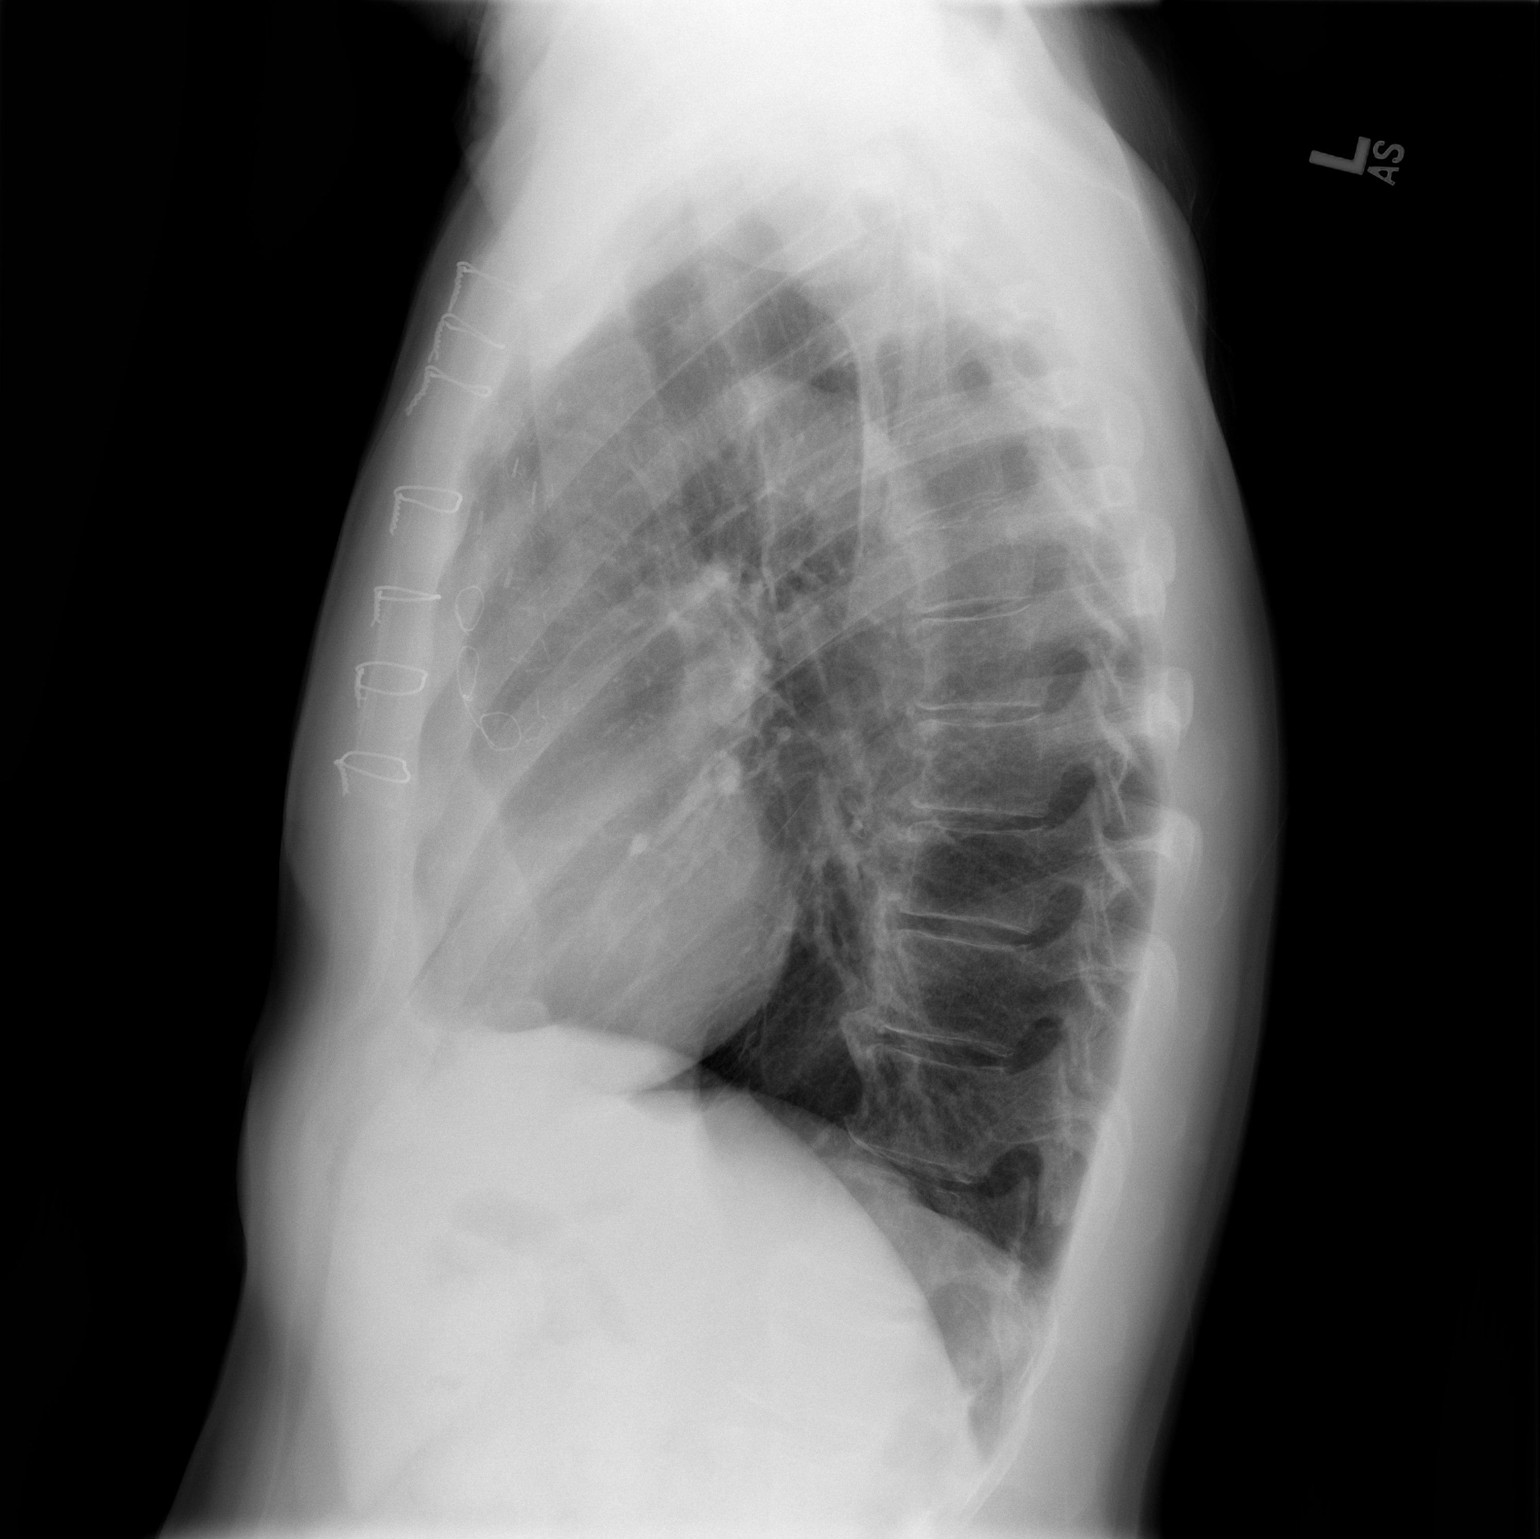

[2 of 2 positions shown; findings below may reference images not displayed]

FINDINGS: Mediastinum and hilar structures normal. Prior CABG. Heart size
normal. Stable right base pleural parenchymal thickening again noted
consistent with scarring. No pleural effusion or pneumothorax.
IMPRESSION: Prior CABG. Borderline cardiomegaly normal pulmonary vascularity.
Right base pleural parenchymal scarring.

## 2017-11-01 ENCOUNTER — Telehealth: Payer: Self-pay | Admitting: Interventional Cardiology

## 2017-11-01 NOTE — Telephone Encounter (Signed)
**Note De-Identified  Obfuscation** The pt is advised that the [employer] rep provided me with a letter to use that may help obtain this PA.  I have read the letter and it states that people with triglycerides between 135 and 499 may be approved to get [employer] if this letter is sent with the prior authorization.    The pts last triglyceride level was 133 on 06/22/17.  Will forward this message to Dr Irish Lack and his nurse to see if he wants to order a lipid profile to see if the pts triglycerides has increased to 135 or above.

## 2017-11-01 NOTE — Telephone Encounter (Signed)
New message     Pt c/o medication issue:  1. Name of [name]   2. How are you currently taking this medication (dosage and times per day)? 4 tablets a day   3. Are you having a reaction (difficulty breathing--STAT)?  No   4. What is your medication issue?  Needs pre-authorization for patient to get medication at a reduced rate [phone]

## 2017-11-02 NOTE — Telephone Encounter (Signed)
OK to order fasting lipid profile

## 2017-11-03 NOTE — Telephone Encounter (Signed)
[place] can you please address this, Thanks, Maudie Mercury

## 2017-11-03 NOTE — Telephone Encounter (Signed)
Called patient and made him aware of the information below. Patient already had lipid and lfts scheduled for 3/26. Lab appointment moved to 3/12 per patient's request.

## 2017-11-08 ENCOUNTER — Other Ambulatory Visit: Payer: BC Managed Care – PPO

## 2017-11-08 ENCOUNTER — Encounter (INDEPENDENT_AMBULATORY_CARE_PROVIDER_SITE_OTHER): Payer: Self-pay

## 2017-11-08 DIAGNOSIS — E7800 Pure hypercholesterolemia, unspecified: Secondary | ICD-10-CM

## 2017-11-08 LAB — HEPATIC FUNCTION PANEL
ALT: 19 IU/L (ref 0–44)
AST: 15 IU/L (ref 0–40)
Albumin: 4.4 g/dL (ref 3.6–4.8)
Alkaline Phosphatase: 83 IU/L (ref 39–117)
Bilirubin Total: <0.2 mg/dL (ref 0.0–1.2)
Bilirubin, Direct: 0.09 mg/dL (ref 0.00–0.40)
Total Protein: 6.7 g/dL (ref 6.0–8.5)

## 2017-11-08 LAB — LIPID PANEL
Chol/HDL Ratio: 2.2 ratio (ref 0.0–5.0)
Cholesterol, Total: 114 mg/dL (ref 100–199)
HDL: 52 mg/dL (ref >39)
LDL Calculated: 43 mg/dL (ref 0–99)
Triglycerides: 93 mg/dL (ref 0–149)
VLDL Cholesterol Cal: 19 mg/dL (ref 5–40)

## 2017-11-09 ENCOUNTER — Telehealth (HOSPITAL_COMMUNITY): Payer: Self-pay

## 2017-11-09 NOTE — Telephone Encounter (Signed)
Attempted to call patient in regards to Cardiac Rehab and scheduling his follow up appt - lm on vm. Closed referral.

## 2017-11-13 ENCOUNTER — Other Ambulatory Visit: Payer: Self-pay

## 2017-11-13 ENCOUNTER — Emergency Department (HOSPITAL_COMMUNITY)
Admission: EM | Admit: 2017-11-13 | Discharge: 2017-11-13 | Disposition: A | Discharge status: Home / Self Care | Payer: BC Managed Care – PPO | Attending: Emergency Medicine | Admitting: Emergency Medicine

## 2017-11-13 ENCOUNTER — Encounter (HOSPITAL_COMMUNITY): Payer: Self-pay | Admitting: Behavioral Health

## 2017-11-13 DIAGNOSIS — Z79899 Other long term (current) drug therapy: Secondary | ICD-10-CM | POA: Diagnosis not present

## 2017-11-13 DIAGNOSIS — F329 Major depressive disorder, single episode, unspecified: Secondary | ICD-10-CM | POA: Insufficient documentation

## 2017-11-13 DIAGNOSIS — R451 Restlessness and agitation: Secondary | ICD-10-CM | POA: Diagnosis present

## 2017-11-13 DIAGNOSIS — I25700 Atherosclerosis of coronary artery bypass graft(s), unspecified, with unstable angina pectoris: Secondary | ICD-10-CM | POA: Diagnosis not present

## 2017-11-13 DIAGNOSIS — I10 Essential (primary) hypertension: Secondary | ICD-10-CM | POA: Diagnosis not present

## 2017-11-13 DIAGNOSIS — I252 Old myocardial infarction: Secondary | ICD-10-CM | POA: Diagnosis not present

## 2017-11-13 DIAGNOSIS — R4689 Other symptoms and signs involving appearance and behavior: Secondary | ICD-10-CM

## 2017-11-13 DIAGNOSIS — F32A Depression, unspecified: Secondary | ICD-10-CM

## 2017-11-13 LAB — CBC WITH DIFFERENTIAL/PLATELET
Basophils Absolute: 0 10*3/uL (ref 0.0–0.1)
Basophils Relative: 0 %
Eosinophils Absolute: 0.3 10*3/uL (ref 0.0–0.7)
Eosinophils Relative: 3 %
HCT: 44.7 % (ref 39.0–52.0)
Hemoglobin: 14.4 g/dL (ref 13.0–17.0)
Lymphocytes Relative: 25 %
Lymphs Abs: 2.4 10*3/uL (ref 0.7–4.0)
MCH: 30.5 pg (ref 26.0–34.0)
MCHC: 32.2 g/dL (ref 30.0–36.0)
MCV: 94.7 fL (ref 78.0–100.0)
Monocytes Absolute: 0.6 10*3/uL (ref 0.1–1.0)
Monocytes Relative: 7 %
Neutro Abs: 6.2 10*3/uL (ref 1.7–7.7)
Neutrophils Relative %: 65 %
Platelets: 250 10*3/uL (ref 150–400)
RBC: 4.72 MIL/uL (ref 4.22–5.81)
RDW: 13.3 % (ref 11.5–15.5)
WBC: 9.5 10*3/uL (ref 4.0–10.5)

## 2017-11-13 LAB — BASIC METABOLIC PANEL
Anion gap: 8 (ref 5–15)
BUN: 24 mg/dL — ABNORMAL HIGH (ref 6–20)
CO2: 28 mmol/L (ref 22–32)
Calcium: 9.4 mg/dL (ref 8.9–10.3)
Chloride: 105 mmol/L (ref 101–111)
Creatinine, Ser: 1.1 mg/dL (ref 0.61–1.24)
GFR calc Af Amer: >60 mL/min (ref >60)
GFR calc non Af Amer: >60 mL/min (ref >60)
Glucose, Bld: 101 mg/dL — ABNORMAL HIGH (ref 65–99)
Potassium: 4.7 mmol/L (ref 3.5–5.1)
Sodium: 141 mmol/L (ref 135–145)

## 2017-11-13 LAB — ACETAMINOPHEN LEVEL: Acetaminophen (Tylenol), Serum: <10 ug/mL — ABNORMAL LOW (ref 10–30)

## 2017-11-13 LAB — RAPID URINE DRUG SCREEN, HOSP PERFORMED
Amphetamines: NOT DETECTED
Barbiturates: NOT DETECTED
Benzodiazepines: POSITIVE — AB
Cocaine: NOT DETECTED
Opiates: NOT DETECTED
Tetrahydrocannabinol: NOT DETECTED

## 2017-11-13 LAB — SALICYLATE LEVEL: Salicylate Lvl: <7 mg/dL (ref 2.8–30.0)

## 2017-11-13 LAB — ETHANOL: Alcohol, Ethyl (B): <10 mg/dL (ref <10)

## 2017-11-13 MED ORDER — LORAZEPAM 2 MG/ML IJ SOLN
1.0000 mg | Freq: Once | INTRAMUSCULAR | Status: AC
  Administered 2017-11-13: 1 mg via INTRAMUSCULAR
  Filled 2017-11-13: qty 1

## 2017-11-13 NOTE — Discharge Instructions (Signed)
Follow-up with your psychiatrist tomorrow for evaluation and management of your symptoms. Return to the emergency room if you develop suicidal thoughts, plan to harm himself, or plan to harm others.  Return if you develop any new or concerning symptoms.

## 2017-11-13 NOTE — ED Notes (Signed)
Pt discharged home. Discharged instructions read to pt who verbalized understanding. All belongings returned to pt who signed for same. Denies SI/HI, is not delusional and not responding to internal stimuli. Escorted pt to the ED exit.    

## 2017-11-13 NOTE — ED Notes (Signed)
1 bag of  belongings

## 2017-11-13 NOTE — ED Notes (Signed)
Upon speaking further with the patient, patient recently had his anti-depressant switched due to his previous medication not working. Patient states the medication made him feel great for about a week and then the dreams began.

## 2017-11-13 NOTE — ED Provider Notes (Signed)
Patient signed out to me by L Layden, PA-C.  Please see previous notes for further history.  In brief, patient with a history of depression.  A week ago he was switched from his Prozac to a new medication.  Since then he has had worsening depression with depressed thoughts.  He has worsening anger which has become uncontrollable.  He has been having lucid dreams in which he is getting raped.  When he wakes up, he is unable to calm himself and becomes aggressive and angry.  This escalated tonight, and he is coming to the emergency room to seek help.  He is currently calm and cooperative and is under voluntary commitment.  He denies SI, HI, or auditory/visual hallucinations while awake.  Labs are pending for medical clearance prior to TTS evaluation.  Labs reassuring.  Patient is medically cleared for TTS evaluation at this time.  TTS evaluated the patient, and in consultation with the nurse practitioner and behavioral health doctor decided patient did not meet inpatient criteria and should follow-up with his outpatient psychiatrist tomorrow.  Patient is stable for discharge.  Return precautions given.   Franchot Heidelberg, PA-C 11/13/17 1235    Little, Wenda Overland, MD 11/16/17 2043

## 2017-11-13 NOTE — ED Provider Notes (Signed)
[facility] Provider Note   CSN: [id number] Arrival date & time: 11/13/17  5102     History   Chief Complaint Chief Complaint  Patient presents with  . Medication Reaction  . Medical Clearance    HPI [name] is a 62 y.o. male past medical history anxiety, depression who presents for evaluation of agitation, aggression.  Patient reports that he was recently switched from his Prozac medication for his depression to a medication that he cannot recall the name of but thinks it is still the next.  Patient reports that he has been taking this medication for 2 weeks.  Patient reports that over the last few days, he has started having very lucid dreams.  Patient reports he will wake up from these dreams and feels like he has "no control over what he does."Patient states that these dreams worsened tonight.  Patient states that he was hallucinating that he was being raped and woke up and was very aggressive and agitated towards his wife.  His wife called the police.  There is no evidence of domestic altercation.  Patient reports that additionally he has felt like he is losing control since taking this medication.  He states that he has long suffered from depression and has felt like his symptoms have progressively worsened over the last 2 weeks.  He states that he "needs help to regain control so he does not do something stupid."  He denies any SI, HI, visual, auditory hallucinations.  Patient denies any smoking.  Denies any cocaine, heroin, marijuana use.  The history is provided by the patient.    Past Medical History:  Diagnosis Date  . Anxiety   . Basal cell carcinoma (BCC) of forehead   . CAD (coronary artery disease)    a. 10/2015 ant STEMI >> LHC with 3 v CAD; oLAD tx with POBA >> emergent CABG. b. Multiple evals since that time, early graft failure of SVG-RCA by cath 03/2016. c. 2/19 PCI/DES x1 to pRCA, normal EF.  Marland Kitchen Depression   . Dyspnea   .  Esophageal reflux    eosinophil esophagitis  . Family history of adverse reaction to anesthesia    "sister has PONV" (06/21/2017)  . Former tobacco use   . Gout   . Hepatitis C    "treated and cured" (06/21/2017)  . High cholesterol   . Hypertension   . Ischemic cardiomyopathy    a. EF 25-30% at intraop TEE 4/17  //  b. Limited Echo 5/17 - EF 45-50%, mild ant HK. c. EF 55-65% by cath 09/2017.  . Migraine    "3-4/yr" (06/21/2017)  . Myocardial infarction ([place]) 10/2015  . Sinus bradycardia    a. HR dropping into 40s in 02/2016 -> BB reduced.  . Stroke ([place]) 10/2016   "small one; sometimes my memory/cognitive issues" (06/21/2017)  . Symptomatic hypotension    a. 02/2016 ER visit -> meds reduced.    Patient Active Problem List   Diagnosis Date Noted  . Chest pain 03/10/2017  . Family hx-stroke 11/10/2016  . Stroke-like episode ([place]) - R brain, s/p tPA 11/09/2016  . Unstable angina ([place]) 09/07/2016  . Claudication of both lower extremities ([place])   . Pure hypercholesterolemia   . Tobacco abuse disorder   . Coronary artery disease involving coronary bypass graft of native heart with unstable angina pectoris ([place])   . Chest pain at rest 06/10/2016  . Abnormal nuclear stress test - HIGH RISK 04/20/2016  . Old  MI (myocardial infarction)   . Essential hypertension 02/26/2016  . Ischemic cardiomyopathy 12/25/2015  . Dyslipidemia, goal LDL below 70 12/25/2015  . STEMI (ST elevation myocardial infarction) ([place]) 11/28/2015  . 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 Coronary artery disease involving native coronary artery of native heart with unstable angina pectoris ([place]) 11/28/2015  . 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 Acute MI anterior wall first episode care [facility])   . Chest pain with high risk for cardiac etiology 03/07/2015  . Dysphagia 03/07/2015  . Gout attack 03/07/2015  . Mixed bipolar I disorder ([place]) 03/07/2015  . Fibromyalgia 07/09/2014  . Gout 07/09/2014  . Anxiety  07/09/2014  . Depression 07/09/2014  . Hepatitis C 11/20/2012  . Eosinophilic esophagitis 44/10/4740    Past Surgical History:  Procedure Laterality Date  . BASAL CELL CARCINOMA EXCISION     "forehead  . CARDIAC CATHETERIZATION N/A 11/28/2015   Procedure: Left Heart Cath and Coronary Angiography;  Surgeon: [name], MD;  Location: [facility];  Service: Cardiovascular;  Laterality: N/A;  . CARDIAC CATHETERIZATION N/A 11/28/2015   Procedure: Coronary Balloon Angioplasty;  Surgeon: [name], MD;  Location: [place];  Service: Cardiovascular;  Laterality: N/A;  ostial LAD  . CARDIAC CATHETERIZATION N/A 11/28/2015   Procedure: Coronary/Graft Angiography;  Surgeon: [name], MD;  Location: [place] [facility];  Service: Cardiovascular;  Laterality: N/A;  coronaries only   . CARDIAC CATHETERIZATION N/A 04/21/2016   Procedure: Left Heart Cath and Coronary Angiography;  Surgeon: Wellington Hampshire, MD;  Location: [facility];  Service: Cardiovascular;  Laterality: N/A;  . CARDIAC CATHETERIZATION N/A 06/14/2016   Procedure: Left Heart Cath and Cors/Grafts Angiography;  Surgeon: Lorretta Harp, MD;  Location: [place];  Service: Cardiovascular;  Laterality: N/A;  . CARDIAC CATHETERIZATION N/A 09/08/2016   Procedure: Left Heart Cath and Cors/Grafts Angiography;  Surgeon: Wellington Hampshire, MD;  Location: [place] [facility];  Service: Cardiovascular;  Laterality: N/A;  . CARDIAC CATHETERIZATION    . CORONARY ARTERY BYPASS GRAFT N/A 11/28/2015   Procedure: CORONARY ARTERY BYPASS GRAFTING (CABG) TIMES FIVE USING LEFT INTERNAL MAMMARY ARTERY AND RIGHT GREATER SAPHENOUS,VIEN HARVEATED BY ENDOVIEN, INTRAOPPRATIVE TEE;  Surgeon: Gaye Pollack, MD;  Location: [facility];  Service: Open Heart Surgery;  Laterality: N/A;  . CORONARY STENT INTERVENTION N/A 10/05/2017   Procedure: CORONARY STENT INTERVENTION;  Surgeon: [name], MD;  Location: [facility];  Service: Cardiovascular;  Laterality: N/A;  . HUMERUS SURGERY Right 1969   "tumor inside bone; filled it w/bone chips"  . LEFT HEART CATH AND CORS/GRAFTS ANGIOGRAPHY N/A 03/11/2017   Procedure: Left Heart Cath and Cors/Grafts Angiography;  Surgeon: Leonie Man, MD;  Location: [facility];  Service: Cardiovascular;  Laterality: N/A;  . LEFT HEART CATH AND CORS/GRAFTS ANGIOGRAPHY N/A 10/05/2017   Procedure: LEFT HEART CATH AND CORS/GRAFTS ANGIOGRAPHY;  Surgeon: [name], MD;  Location: [facility];  Service: Cardiovascular;  Laterality: N/A;  . PERIPHERAL VASCULAR CATHETERIZATION N/A 06/14/2016   Procedure: Lower Extremity Angiography;  Surgeon: Lorretta Harp, MD;  Location: [facility];  Service: Cardiovascular;  Laterality: N/A;       Home Medications    Prior to Admission medications   Medication Sig Start Date End Date Taking? Authorizing Provider  acetaminophen (TYLENOL) 500 MG tablet Take 500 mg by mouth every 6 (six) hours as  needed for mild pain.    [provider]  amLODipine (NORVASC) 10 MG tablet Take 1 tablet (10 mg total) by mouth daily. 06/22/17   Strader, Fransisco Hertz, PA-C  aspirin 81 MG tablet Take 81 mg by mouth daily.    [provider]  atorvastatin (LIPITOR) 80 MG tablet Take 1 tablet (80 mg total) by mouth daily. 08/16/17   [name], MD  chlordiazePOXIDE (LIBRIUM) 10 MG capsule Take 10 mg by mouth daily as needed for anxiety.     [provider]  clopidogrel (PLAVIX) 75 MG tablet take 1 tablet by mouth once daily 01/25/17   [name] T, PA-C  ezetimibe (ZETIA) 10 MG tablet Take 1 tablet (10 mg total) by mouth daily. 10/07/17 01/05/18  [name], MD  FLUoxetine (PROZAC) 40 MG capsule Take 40 mg by mouth daily. 08/16/17   [provider]  fluticasone (FLONASE) 50 MCG/ACT nasal spray Place 1 spray into both nostrils daily. 05/26/17   [provider]    Icosapent Ethyl (VASCEPA) 1 g CAPS Take 4 g by mouth daily. 08/11/17   [name], MD  lisinopril (PRINIVIL,ZESTRIL) 5 MG tablet Take 5 mg by mouth daily. May take 1 additional tablet once daily for SBP greater than 150 once hour after taking daily dose    [provider]  metoprolol succinate (TOPROL-XL) 25 MG 24 hr tablet Take 1 tablet (25 mg total) by mouth daily. 06/23/17   Strader, Fransisco Hertz, PA-C  Multiple Vitamins-Minerals (ONE-A-DAY MENS 50+ ADVANTAGE PO) Take 1 tablet by mouth daily.     [provider]  nitroGLYCERIN (NITROSTAT) 0.4 MG SL tablet Place 1 tablet (0.4 mg total) under the tongue every 5 (five) minutes as needed for chest pain. 3 DOSE MAX 06/22/17   Strader, [place] M, PA-C  olopatadine (PATANOL) 0.1 % ophthalmic solution Place 1 drop as needed into both eyes. 05/26/17   [provider]  pantoprazole (PROTONIX) 40 MG tablet Take 40 mg by mouth daily. 06/19/17   [provider]  ranolazine (RANEXA) 1000 MG SR tablet Take 1 tablet (1,000 mg total) by mouth 2 (two) times daily. 03/13/17   Rise Mu, PA-C  traZODone (DESYREL) 50 MG tablet Take 50 mg by mouth at bedtime as needed for sleep.  12/08/16   [provider]    Family History Family History  Problem Relation Age of Onset  . Lung cancer Mother   . Heart Problems Father   . Heart attack Father 29  . Stroke Father   . Heart failure Father   . Heart attack Maternal Grandmother   . Stroke Maternal Grandmother   . Heart attack Paternal Uncle   . Hypertension Brother   . Autoimmune disease Neg Hx     Social History Social History   Tobacco Use  . Smoking status: Former Smoker    Packs/day: 0.75    Years: 44.00    Pack years: 33.00    Types: Cigarettes    Last attempt to quit: 11/28/2015    Years since quitting: 1.9  . Smokeless tobacco: Never Used  Substance Use Topics  . Alcohol use: Yes    Comment: 06/21/2017 "I'll have a few drinks q couple  months"  . Drug use: Yes    Comment: 06/21/2017 "nothing since the 1980s"     Allergies   Prednisone; Tetanus toxoids; Wellbutrin [bupropion]; and Chantix [varenicline]   Review of Systems Review of Systems  Constitutional: Negative for fever.  Respiratory: Negative for cough and shortness of breath.   Cardiovascular: Negative for chest pain.  Gastrointestinal: Negative for abdominal pain, diarrhea, nausea and vomiting.  Genitourinary: Negative for dysuria and hematuria.  Musculoskeletal: Negative for back pain and neck pain.  Skin: Negative for rash.  Neurological: Negative for weakness and numbness.  Psychiatric/Behavioral: Positive for agitation. Negative for hallucinations and suicidal ideas.     Physical Exam Updated Vital Signs BP 122/74 (BP Location: Right Arm)   Pulse 79   Temp 98.1 F (36.7 C) (Oral)   Resp 18   SpO2 99%   Physical Exam  Constitutional: He is oriented to person, place, and time. He appears well-developed and well-nourished.  HENT:  Head: Normocephalic and atraumatic.  Mouth/Throat: Oropharynx is clear and moist and mucous membranes are normal.  Eyes: Conjunctivae, EOM and lids are normal. Pupils are equal, round, and reactive to light.  Neck: Full passive range of motion without pain.  Cardiovascular: Normal rate, regular rhythm, normal heart sounds and normal pulses. Exam reveals no gallop and no friction rub.  No murmur heard. Pulmonary/Chest: Effort normal and breath sounds normal.  Abdominal: Soft. Normal appearance. There is no tenderness. There is no rigidity and no guarding.  Musculoskeletal: Normal range of motion.  Neurological: He is alert and oriented to person, place, and time.  Skin: Skin is warm and dry. Capillary refill takes less than 2 seconds.  Psychiatric: He has a normal mood and affect. His speech is normal.  Nursing note and vitals reviewed.    ED Treatments / Results  Labs (all labs ordered are listed, but only  abnormal results are displayed) Labs Reviewed  RAPID URINE DRUG SCREEN, HOSP PERFORMED  BASIC METABOLIC PANEL  CBC WITH DIFFERENTIAL/PLATELET  ETHANOL  SALICYLATE LEVEL  ACETAMINOPHEN LEVEL    EKG  EKG Interpretation None       Radiology No results found.  Procedures Procedures (including critical care time)  Medications Ordered in ED Medications  LORazepam (ATIVAN) injection 1 mg (not administered)     Initial Impression / Assessment and Plan / ED Course  I have reviewed the triage vital signs and the nursing notes.  Pertinent labs & imaging results that were available during my care of the patient were reviewed by me and considered in my medical decision making (see chart for details).     62 year old male who presents for evaluation of aggression and agitation.  Patient with history of depression was recently switched from Prozac to a different medication.  States he feels like he is "spiraling out of control."  He is having vivid dreams where he is getting raped and then will wake up and become very agitated.  It escalated tonight when he became agitated after dream and wife called cops.  No SI, HI, visual, auditory hallucinations.  These dreams only occur when sleeping.  Plan for medical clearance labs, consult TTS.  Acetaminophen level unremarkable.  Salicylate level unremarkable.  Ethanol level unremarkable.  BMP shows slight bump in BUN but otherwise unremarkable.  CBC is without any significant leukocytosis, anemia.  Rapid urine drug screen is positive for benzos.  It is medically cleared. Pending TTS consult.  Patient signed out to Franchot Heidelberg, PA-C with TTS consult pending. Please refer to oncoming provider note for further ED course.   Final Clinical Impressions(s) / ED Diagnoses   Final diagnoses:  Depression, unspecified depression type  Aggressive behavior    ED Discharge Orders    None  [name], PA-C 11/13/17 2119    Veryl Speak, MD 11/14/17 [phone]

## 2017-11-13 NOTE — ED Notes (Signed)
He is awake, alert and oriented x 4. He tells me he is a bit anxious, recalling "vivid dreams".

## 2017-11-13 NOTE — ED Notes (Signed)
Patient stated that he was getting agitated and asked when he would see a doctor.

## 2017-11-13 NOTE — ED Notes (Signed)
Bed: XYI01 Expected date:  Expected time:  Means of arrival:  Comments: Hold for room 7
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
## 2017-11-13 NOTE — ED Notes (Signed)
Pt reports anxiety, but denies SI/HI/AVH. He does not know why he is here exactly.  He wanted some more medication for anxiety and said that the Ativan shot did not help much. 15 minutes after saying that he was in bed asleep.
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
## 2017-11-22 ENCOUNTER — Other Ambulatory Visit: Payer: Self-pay

## 2017-12-29 ENCOUNTER — Telehealth: Payer: Self-pay | Admitting: Interventional Cardiology

## 2017-12-29 IMAGING — DX DG CHEST 2V
2 series · 2 of 2 positions shown · non-contrast
Comparison: PA and lateral chest 12/31/2015 and 01/03/2015.

CLINICAL DATA: Mid chest pain, nausea, dizziness and shortness of
breath beginning last night.

EXAM:
CHEST  2 VIEW

[chest pa]
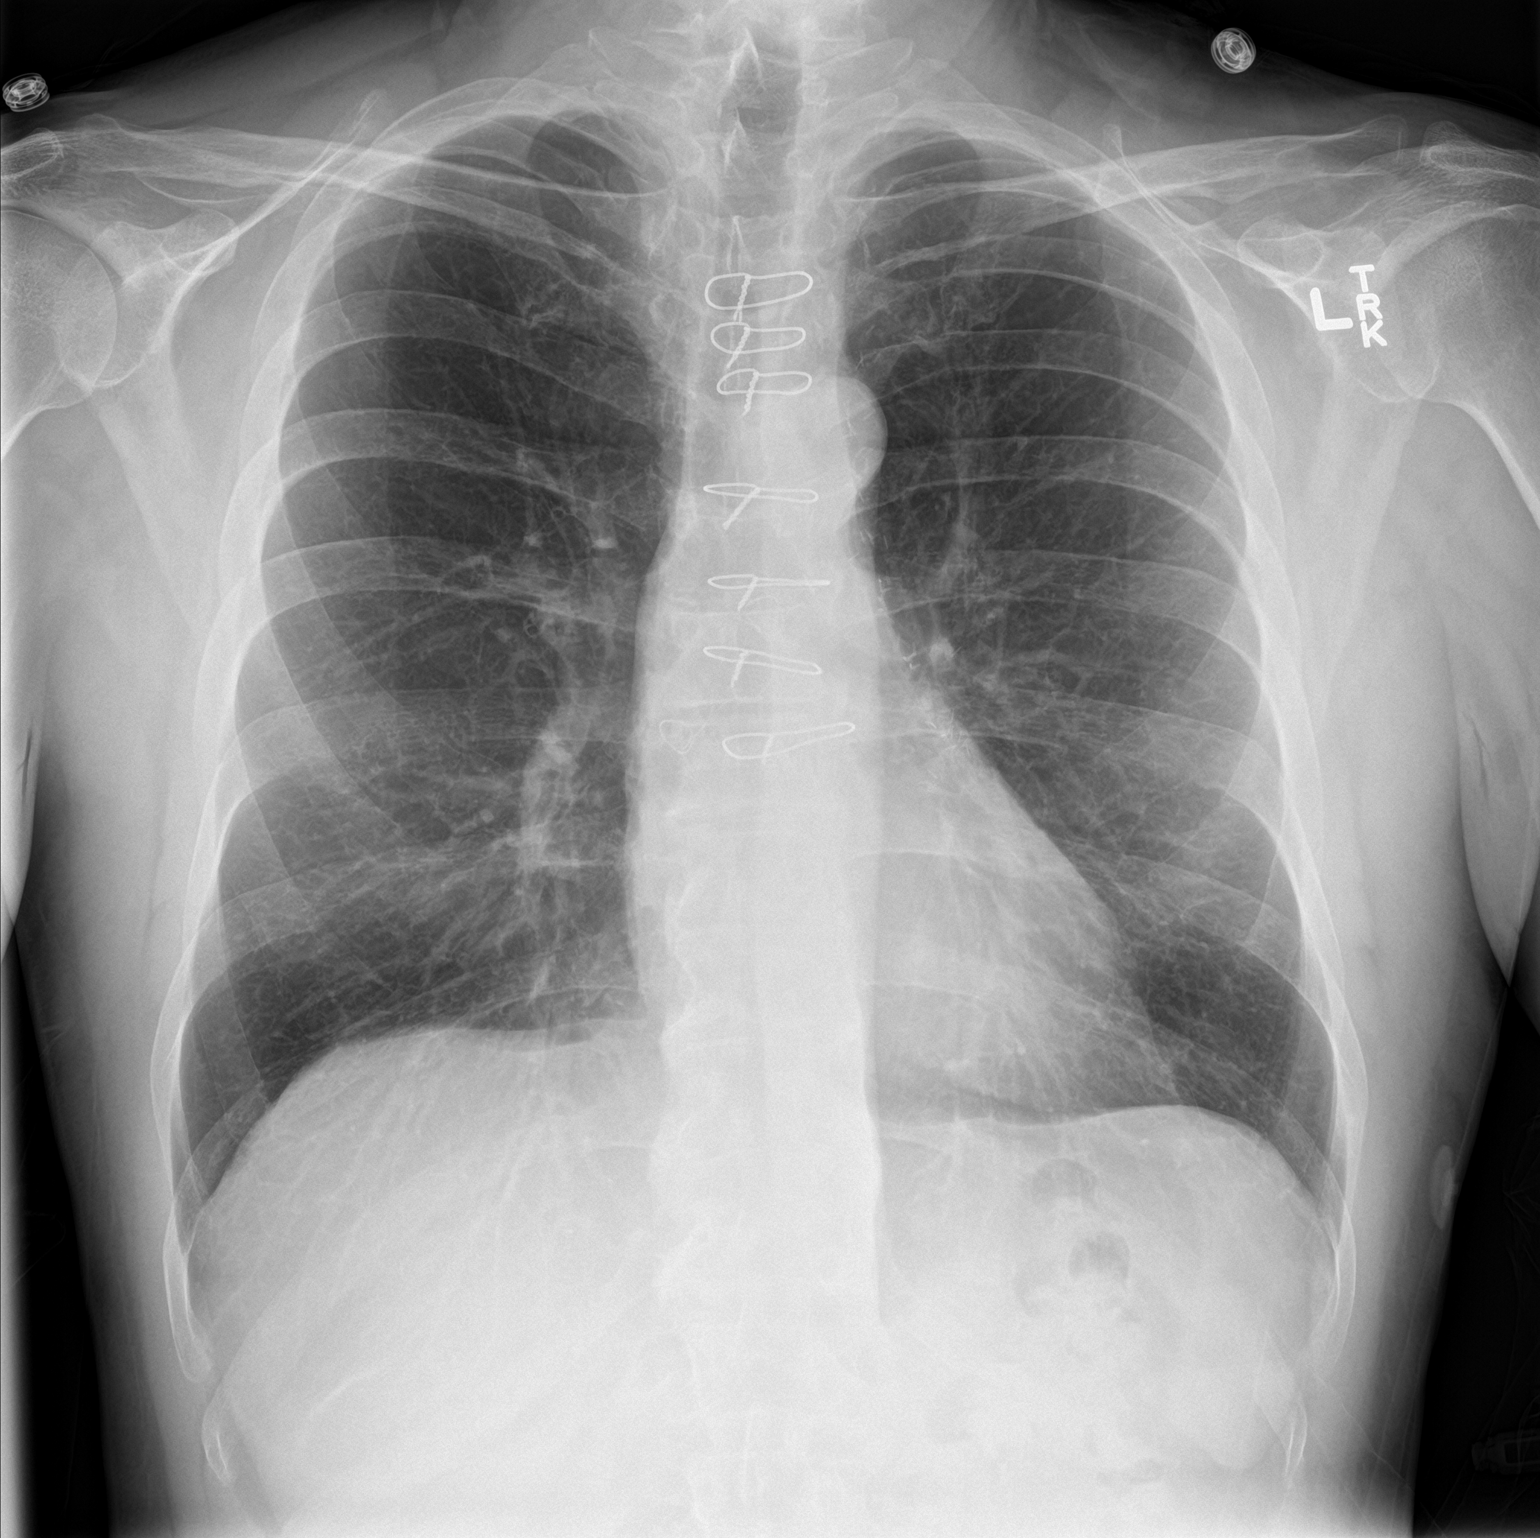

[chest lat]
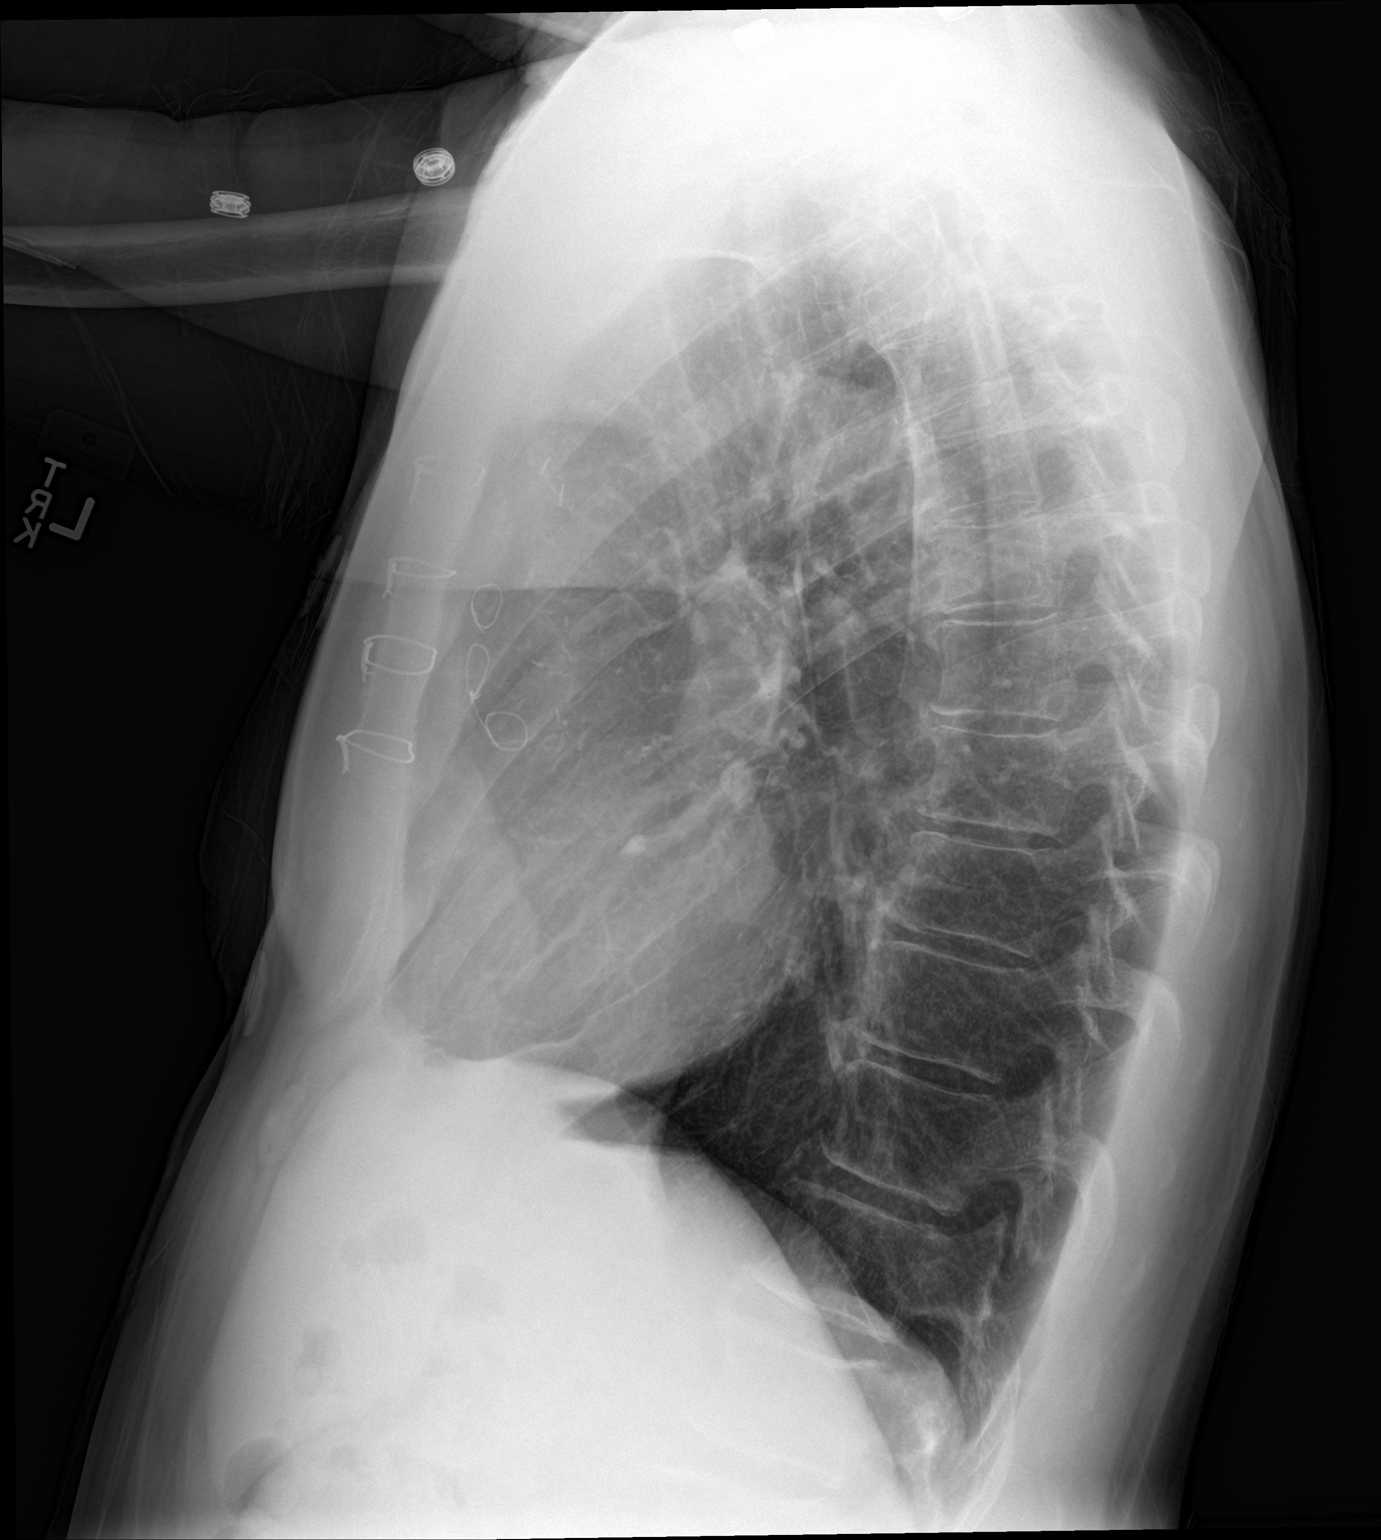

[2 of 2 positions shown; findings below may reference images not displayed]

FINDINGS: The patient is status post CABG. Lungs are clear. Heart size is
normal. No pneumothorax or pleural effusion. No focal bony
abnormality.
IMPRESSION: Negative chest.

## 2017-12-29 NOTE — Telephone Encounter (Signed)
New Message   Pt c/o swelling: STAT is pt has developed SOB within 24 hours  1) How much weight have you gained and in what time span? 10lbs in two weeks   2) If swelling, where is the swelling located? Thighs, gut  3) Are you currently taking a fluid pill? no  4) Are you currently SOB?  No   5) Do you have a log of your daily weights (if so, list)? 201, 198  6) Have you gained 3 pounds in a day or 5 pounds in a week? He says his weight goes back in forth 3lbs daily   7) Have you traveled recently? no   Patient states that his socks are leaving a indent and it takes about 2 hours for it to go away when he removes his socks.  He says he feels great as well.

## 2017-12-29 NOTE — Telephone Encounter (Signed)
Returned call to patient who states that his weight has been fluctuating for the past few weeks and he feels like his stomach has gotten bigger as well as his thighs over the past month or so. Patient states that he does not record his weight, but states that he thinks it was 198 today, around 203 yesterday, around 201 Tuesday, and around 200 on Monday. Patient states that he usually weighs in the morning sometime with similar clothes on. Patient states that the other day when he removed his socks there was an indention on his ankles that took a couple of hours to go away. Patient states that he does not elevate his legs. Patient denies SOB, chest pain, or any other symptoms. Patient states that he feels great. Patient is not on any diuretics. Patient states that he started a new depression medicine vilazodone (Viibryd) 10 mg QD. There is a low percentage of people who experience weight gain, abdominal distension, and increased appetite with this medicine. Patient is going to speak to his PCP regarding this. Patient has an appointment with Dr. Irish Lack on 5/14 at 10:40 AM. Advised patient to elevate legs, wear compression stockings, avoid salt in his diet, and to keep a log of his daily weights, being sure to weigh daily at the same time everyday with the same amount of clothes on for a more accurate assessment of his weight. Patient advised to call if he has >3 lb weight gain in 24 hours or >5 lbs in 1 week, develops SOB, or if his symptoms worsen. Patient verbalized understanding and thanked me for the call.

## 2018-01-03 ENCOUNTER — Other Ambulatory Visit: Payer: Self-pay

## 2018-01-03 ENCOUNTER — Emergency Department (HOSPITAL_BASED_OUTPATIENT_CLINIC_OR_DEPARTMENT_OTHER): Payer: BC Managed Care – PPO

## 2018-01-03 ENCOUNTER — Encounter (HOSPITAL_BASED_OUTPATIENT_CLINIC_OR_DEPARTMENT_OTHER): Payer: Self-pay | Admitting: *Deleted

## 2018-01-03 ENCOUNTER — Emergency Department (HOSPITAL_BASED_OUTPATIENT_CLINIC_OR_DEPARTMENT_OTHER)
Admission: EM | Admit: 2018-01-03 | Discharge: 2018-01-03 | Disposition: A | Discharge status: Home / Self Care | Payer: BC Managed Care – PPO | Attending: Emergency Medicine | Admitting: Emergency Medicine

## 2018-01-03 DIAGNOSIS — Z85828 Personal history of other malignant neoplasm of skin: Secondary | ICD-10-CM | POA: Diagnosis not present

## 2018-01-03 DIAGNOSIS — R109 Unspecified abdominal pain: Secondary | ICD-10-CM

## 2018-01-03 DIAGNOSIS — Z79899 Other long term (current) drug therapy: Secondary | ICD-10-CM | POA: Diagnosis not present

## 2018-01-03 DIAGNOSIS — Z7902 Long term (current) use of antithrombotics/antiplatelets: Secondary | ICD-10-CM | POA: Diagnosis not present

## 2018-01-03 DIAGNOSIS — R1031 Right lower quadrant pain: Secondary | ICD-10-CM | POA: Insufficient documentation

## 2018-01-03 DIAGNOSIS — Z951 Presence of aortocoronary bypass graft: Secondary | ICD-10-CM | POA: Diagnosis not present

## 2018-01-03 DIAGNOSIS — Z87891 Personal history of nicotine dependence: Secondary | ICD-10-CM | POA: Diagnosis not present

## 2018-01-03 DIAGNOSIS — I10 Essential (primary) hypertension: Secondary | ICD-10-CM | POA: Diagnosis not present

## 2018-01-03 DIAGNOSIS — I252 Old myocardial infarction: Secondary | ICD-10-CM | POA: Diagnosis not present

## 2018-01-03 DIAGNOSIS — Z7982 Long term (current) use of aspirin: Secondary | ICD-10-CM | POA: Diagnosis not present

## 2018-01-03 DIAGNOSIS — R197 Diarrhea, unspecified: Secondary | ICD-10-CM | POA: Insufficient documentation

## 2018-01-03 DIAGNOSIS — R1010 Upper abdominal pain, unspecified: Secondary | ICD-10-CM | POA: Insufficient documentation

## 2018-01-03 DIAGNOSIS — I2510 Atherosclerotic heart disease of native coronary artery without angina pectoris: Secondary | ICD-10-CM | POA: Diagnosis not present

## 2018-01-03 LAB — CBC WITH DIFFERENTIAL/PLATELET
Basophils Absolute: 0 10*3/uL (ref 0.0–0.1)
Basophils Relative: 0 %
Eosinophils Absolute: 0.2 10*3/uL (ref 0.0–0.7)
Eosinophils Relative: 2 %
HCT: 41.1 % (ref 39.0–52.0)
Hemoglobin: 14.1 g/dL (ref 13.0–17.0)
Lymphocytes Relative: 29 %
Lymphs Abs: 2.4 10*3/uL (ref 0.7–4.0)
MCH: 31.7 pg (ref 26.0–34.0)
MCHC: 34.3 g/dL (ref 30.0–36.0)
MCV: 92.4 fL (ref 78.0–100.0)
Monocytes Absolute: 0.6 10*3/uL (ref 0.1–1.0)
Monocytes Relative: 8 %
Neutro Abs: 5 10*3/uL (ref 1.7–7.7)
Neutrophils Relative %: 61 %
Platelets: 197 10*3/uL (ref 150–400)
RBC: 4.45 MIL/uL (ref 4.22–5.81)
RDW: 13.2 % (ref 11.5–15.5)
WBC: 8.2 10*3/uL (ref 4.0–10.5)

## 2018-01-03 LAB — COMPREHENSIVE METABOLIC PANEL
ALT: 15 U/L — ABNORMAL LOW (ref 17–63)
AST: 15 U/L (ref 15–41)
Albumin: 4.4 g/dL (ref 3.5–5.0)
Alkaline Phosphatase: 61 U/L (ref 38–126)
Anion gap: 8 (ref 5–15)
BUN: 20 mg/dL (ref 6–20)
CO2: 28 mmol/L (ref 22–32)
Calcium: 9.4 mg/dL (ref 8.9–10.3)
Chloride: 105 mmol/L (ref 101–111)
Creatinine, Ser: 0.99 mg/dL (ref 0.61–1.24)
GFR calc Af Amer: >60 mL/min (ref >60)
GFR calc non Af Amer: >60 mL/min (ref >60)
Glucose, Bld: 94 mg/dL (ref 65–99)
Potassium: 3.8 mmol/L (ref 3.5–5.1)
Sodium: 141 mmol/L (ref 135–145)
Total Bilirubin: 0.8 mg/dL (ref 0.3–1.2)
Total Protein: 7.2 g/dL (ref 6.5–8.1)

## 2018-01-03 LAB — TROPONIN I
Troponin I: <0.03 ng/mL (ref <0.03)
Troponin I: <0.03 ng/mL (ref <0.03)

## 2018-01-03 LAB — LIPASE, BLOOD: Lipase: 25 U/L (ref 11–51)

## 2018-01-03 MED ORDER — GI COCKTAIL ~~LOC~~
30.0000 mL | Freq: Once | ORAL | Status: AC
  Administered 2018-01-03: 30 mL via ORAL
  Filled 2018-01-03: qty 30

## 2018-01-03 MED ORDER — ONDANSETRON HCL 4 MG/2ML IJ SOLN
4.0000 mg | Freq: Once | INTRAMUSCULAR | Status: AC
  Administered 2018-01-03: 4 mg via INTRAVENOUS
  Filled 2018-01-03: qty 2

## 2018-01-03 MED ORDER — SODIUM CHLORIDE 0.9 % IV BOLUS
500.0000 mL | Freq: Once | INTRAVENOUS | Status: AC
  Administered 2018-01-03: 500 mL via INTRAVENOUS

## 2018-01-03 NOTE — ED Provider Notes (Signed)
[facility]PART[facility] Provider Note  CSN: [id number] Arrival date & time: 01/03/18 1558  Chief Complaint(s) Abdominal Pain  HPI [name] is a 62 y.o. male   The history is provided by the patient.  Abdominal Pain   This is a new problem. The current episode started 3 to 5 hours ago. The problem occurs constantly. Progression since onset: fluctuating. The pain is associated with suspicious food intake. The pain is located in the epigastric region, LUQ, RUQ and RLQ (pain radiates up to chest at times). The quality of the pain is cramping, aching and colicky. The pain is moderate. Associated symptoms include anorexia, diarrhea (for 4-5 days) and nausea. Pertinent negatives include fever, hematochezia, melena, vomiting, constipation, dysuria and myalgias. The symptoms are aggravated by palpation. Nothing relieves the symptoms.    Past Medical History Past Medical History:  Diagnosis Date  . Anxiety   . Basal cell carcinoma (BCC) of forehead   . CAD (coronary artery disease)    a. 10/2015 ant STEMI >> LHC with 3 v CAD; oLAD tx with POBA >> emergent CABG. b. Multiple evals since that time, early graft failure of SVG-RCA by cath 03/2016. c. 2/19 PCI/DES x1 to pRCA, normal EF.  Marland Kitchen Depression   . Dyspnea   . Esophageal reflux    eosinophil esophagitis  . Family history of adverse reaction to anesthesia    "sister has PONV" (06/21/2017)  . Former tobacco use   . Gout   . Hepatitis C    "treated and cured" (06/21/2017)  . High cholesterol   . Hypertension   . Ischemic cardiomyopathy    a. EF 25-30% at intraop TEE 4/17  //  b. Limited Echo 5/17 - EF 45-50%, mild ant HK. c. EF 55-65% by cath 09/2017.  . Migraine    "3-4/yr" (06/21/2017)  . Myocardial infarction ([place]) 10/2015  . Sinus bradycardia    a. HR dropping into 40s in 02/2016 -> BB reduced.  . Stroke ([place]) 10/2016   "small one; sometimes my memory/cognitive issues" (06/21/2017)  . Symptomatic hypotension    a. 02/2016 ER visit -> meds reduced.   Patient Active Problem List   Diagnosis Date Noted  . Chest pain 03/10/2017  . Family hx-stroke 11/10/2016  . Stroke-like episode ([place]) - R brain, s/p tPA 11/09/2016  . Unstable angina ([place]) 09/07/2016  . Claudication of both lower extremities ([place])   . Pure hypercholesterolemia   . Tobacco abuse disorder   . Coronary artery disease involving coronary bypass graft of native heart with unstable angina pectoris ([place])   . Chest pain at rest 06/10/2016  . Abnormal nuclear stress test - HIGH RISK 04/20/2016  . Old MI (myocardial infarction)   . Essential hypertension 02/26/2016  . Ischemic cardiomyopathy 12/25/2015  . Dyslipidemia, goal LDL below 70 12/25/2015  . STEMI (ST elevation myocardial infarction) ([place]) 11/28/2015  . 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 Coronary artery disease involving native coronary artery of native heart with unstable angina pectoris ([place]) 11/28/2015  . 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 Acute MI anterior wall first episode care [facility])   . Chest pain with high risk for cardiac etiology 03/07/2015  . Dysphagia 03/07/2015  . Gout attack 03/07/2015  . Mixed bipolar I disorder ([place]) 03/07/2015  . Fibromyalgia 07/09/2014  . Gout 07/09/2014  . Anxiety 07/09/2014  . Depression 07/09/2014  . Hepatitis C 11/20/2012  . Eosinophilic esophagitis 02/20/7627   Home Medication(s) Prior to Admission medications  Medication Sig Start Date End Date Taking? Authorizing Provider  amLODipine (NORVASC) 10 MG tablet Take 1 tablet (10 mg total) by mouth daily. 06/22/17  Yes Strader, Fransisco Hertz, PA-C  aspirin 81 MG tablet Take 81 mg by mouth daily.   Yes [provider]  atorvastatin (LIPITOR) 80 MG tablet Take 1 tablet (80 mg total) by mouth daily. 08/16/17  Yes [name], MD  chlordiazePOXIDE (LIBRIUM) 10 MG capsule Take 10 mg by mouth 4 (four) times daily as needed for anxiety.    Yes [provider]    clopidogrel (PLAVIX) 75 MG tablet take 1 tablet by mouth once daily 01/25/17  Yes Weaver, Scott T, PA-C  fluticasone (FLONASE) 50 MCG/ACT nasal spray Place 1 spray into both nostrils daily. 05/26/17  Yes [provider]  acetaminophen (TYLENOL) 500 MG tablet Take 1,000 mg by mouth every 6 (six) hours as needed for mild pain or headache.     [provider]  ezetimibe (ZETIA) 10 MG tablet Take 1 tablet (10 mg total) by mouth daily. 10/07/17 01/05/18  [name], MD  FLUoxetine (PROZAC) 20 MG capsule Take 20 mg by mouth daily. 10/29/17   [provider]  Icosapent Ethyl (VASCEPA) 1 g CAPS Take 4 g by mouth daily. Patient not taking: Reported on 11/13/2017 08/11/17   [name], MD  lisinopril (PRINIVIL,ZESTRIL) 5 MG tablet Take 5 mg by mouth daily. May take 1 additional tablet once daily for SBP greater than 150 once hour after taking daily dose    [provider]  metoprolol succinate (TOPROL-XL) 25 MG 24 hr tablet Take 1 tablet (25 mg total) by mouth daily. 06/23/17   Strader, Fransisco Hertz, PA-C  Multiple Vitamins-Minerals (ONE-A-DAY MENS 50+ ADVANTAGE PO) Take 1 tablet by mouth daily.     [provider]  nitroGLYCERIN (NITROSTAT) 0.4 MG SL tablet Place 1 tablet (0.4 mg total) under the tongue every 5 (five) minutes as needed for chest pain. 3 DOSE MAX 06/22/17   Strader, [place] M, PA-C  olopatadine (PATANOL) 0.1 % ophthalmic solution Place 1 drop into both eyes as needed for allergies.  05/26/17   [provider]  pantoprazole (PROTONIX) 40 MG tablet Take 40 mg by mouth daily.    [provider]  ranolazine (RANEXA) 1000 MG SR tablet Take 1 tablet (1,000 mg total) by mouth 2 (two) times daily. 03/13/17   Rise Mu, PA-C  traZODone (DESYREL) 50 MG tablet Take 50-150 mg by mouth at bedtime as needed for sleep.  12/08/16   [provider]  Vilazodone HCl (VIIBRYD) 10 MG TABS Take 10 mg by mouth daily.    [provider]  vortioxetine HBr (TRINTELLIX) 5 MG TABS tablet Take 5 mg by mouth every evening.    [provider]  Past Surgical History Past Surgical History:  Procedure Laterality Date  . BASAL CELL CARCINOMA EXCISION     "forehead  . CARDIAC CATHETERIZATION N/A 11/28/2015   Procedure: Left Heart Cath and Coronary Angiography;  Surgeon: [name], MD;  Location: [facility];  Service: Cardiovascular;  Laterality: N/A;  . CARDIAC CATHETERIZATION N/A 11/28/2015   Procedure: Coronary Balloon Angioplasty;  Surgeon: [name], MD;  Location: [facility];  Service: Cardiovascular;  Laterality: N/A;  ostial LAD  . CARDIAC CATHETERIZATION N/A 11/28/2015   Procedure: Coronary/Graft Angiography;  Surgeon: [name], MD;  Location: [facility];  Service: Cardiovascular;  Laterality: N/A;  coronaries only   . CARDIAC CATHETERIZATION N/A 04/21/2016   Procedure: Left Heart Cath and Coronary Angiography;  Surgeon: Wellington Hampshire, MD;  Location: [facility];  Service: Cardiovascular;  Laterality: N/A;  . CARDIAC CATHETERIZATION N/A 06/14/2016   Procedure: Left Heart Cath and Cors/Grafts Angiography;  Surgeon: Lorretta Harp, MD;  Location: [facility];  Service: Cardiovascular;  Laterality: N/A;  . CARDIAC CATHETERIZATION N/A 09/08/2016   Procedure: Left Heart Cath and Cors/Grafts Angiography;  Surgeon: Wellington Hampshire, MD;  Location: [facility];  Service: Cardiovascular;  Laterality: N/A;  . CARDIAC CATHETERIZATION    . CORONARY ARTERY BYPASS GRAFT N/A 11/28/2015   Procedure: CORONARY ARTERY BYPASS GRAFTING (CABG) TIMES FIVE USING LEFT INTERNAL MAMMARY ARTERY AND RIGHT GREATER SAPHENOUS,VIEN HARVEATED BY ENDOVIEN, INTRAOPPRATIVE TEE;  Surgeon: Gaye Pollack, MD;  Location: [facility];  Service: Open  Heart Surgery;  Laterality: N/A;  . CORONARY STENT INTERVENTION N/A 10/05/2017   Procedure: CORONARY STENT INTERVENTION;  Surgeon: [name], MD;  Location: [facility];  Service: Cardiovascular;  Laterality: N/A;  . HUMERUS SURGERY Right 1969   "tumor inside bone; filled it w/bone chips"  . LEFT HEART CATH AND CORS/GRAFTS ANGIOGRAPHY N/A 03/11/2017   Procedure: Left Heart Cath and Cors/Grafts Angiography;  Surgeon: Leonie Man, MD;  Location: [facility];  Service: Cardiovascular;  Laterality: N/A;  . LEFT HEART CATH AND CORS/GRAFTS ANGIOGRAPHY N/A 10/05/2017   Procedure: LEFT HEART CATH AND CORS/GRAFTS ANGIOGRAPHY;  Surgeon: [name], MD;  Location: [facility];  Service: Cardiovascular;  Laterality: N/A;  . PERIPHERAL VASCULAR CATHETERIZATION N/A 06/14/2016   Procedure: Lower Extremity Angiography;  Surgeon: Lorretta Harp, MD;  Location: [facility];  Service: Cardiovascular;  Laterality: N/A;   Family History Family History  Problem Relation Age of Onset  . Lung cancer Mother   . Heart Problems Father   . Heart attack Father 45  . Stroke Father   . Heart failure Father   . Heart attack Maternal Grandmother   . Stroke Maternal Grandmother   . Heart attack Paternal Uncle   . Hypertension Brother   . Autoimmune disease Neg Hx     Social History Social History   Tobacco Use  . Smoking status: Former Smoker    Packs/day: 0.75    Years: 44.00    Pack years: 33.00    Types: Cigarettes    Last attempt to quit: 11/28/2015    Years since quitting: 2.1  . Smokeless tobacco: Never Used  Substance Use Topics  . Alcohol use: No    Frequency: Never    Comment: 06/21/2017 "I'll have a few drinks q couple months"  . Drug use: No    Comment: 06/21/2017 "nothing since the 1980s"  Allergies Prednisone; Tetanus toxoids; Wellbutrin [bupropion]; and Chantix [varenicline]  Review of Systems Review of Systems  Constitutional:  Negative for fever.  Gastrointestinal: Positive for abdominal pain, anorexia, diarrhea (for 4-5 days) and nausea. Negative for constipation, hematochezia, melena and vomiting.  Genitourinary: Negative for dysuria.  Musculoskeletal: Negative for myalgias.   All other systems are reviewed and are negative for acute change except as noted in the HPI  Physical Exam Vital Signs  I have reviewed the triage vital signs BP (!) 130/92   Pulse 78   Resp 12   Ht 5\' 9"  (1.753 m)   Wt 88.5 kg (195 lb)   SpO2 97%   BMI 28.80 kg/m   Physical Exam  Constitutional: He is oriented to person, place, and time. He appears well-developed and well-nourished. No distress.  HENT:  Head: Normocephalic and atraumatic.  Nose: Nose normal.  Eyes: Pupils are equal, round, and reactive to light. Conjunctivae and EOM are normal. Right eye exhibits no discharge. Left eye exhibits no discharge. No scleral icterus.  Neck: Normal range of motion. Neck supple.  Cardiovascular: Normal rate and regular rhythm. Exam reveals no gallop and no friction rub.  No murmur heard. Pulmonary/Chest: Effort normal and breath sounds normal. No stridor. No respiratory distress. He has no rales.  Abdominal: Soft. He exhibits no distension. There is tenderness in the right upper quadrant, right lower quadrant, epigastric area and left upper quadrant. There is no rigidity, no rebound, no guarding, no CVA tenderness, no tenderness at McBurney's point and negative Murphy's sign.  Musculoskeletal: He exhibits no edema or tenderness.  Neurological: He is alert and oriented to person, place, and time.  Skin: Skin is warm and dry. No rash noted. He is not diaphoretic. No erythema.  Psychiatric: He has a normal mood and affect.  Vitals reviewed.   ED Results and Treatments Labs (all labs ordered are listed, but only abnormal results are displayed) Labs Reviewed  COMPREHENSIVE METABOLIC PANEL - Abnormal; Notable for the following  components:      Result Value   ALT 15 (*)    All other components within normal limits  TROPONIN I  CBC WITH DIFFERENTIAL/PLATELET  LIPASE, BLOOD  TROPONIN I                                                                                                                         EKG  EKG Interpretation  Date/Time:  Tuesday Jan 03 2018 16:05:35 EDT Ventricular Rate:  83 PR Interval:  144 QRS Duration: 86 QT Interval:  370 QTC Calculation: 434 R Axis:   80 Text Interpretation:  Normal sinus rhythm Cannot rule out Anterior infarct , age undetermined Abnormal ECG No significant change since last tracing Confirmed by Addison Lank [phone]) on 01/03/2018 4:36:47 PM      Radiology Dg Chest 2 View  Result Date: 01/03/2018 CLINICAL DATA:  Chest pain EXAM: CHEST - 2 VIEW COMPARISON:  In chest radiograph 10/04/2017 FINDINGS: Remote median sternotomy  for CABG. No focal airspace consolidation or pulmonary edema. No pneumothorax or pleural effusion. IMPRESSION: No active cardiopulmonary disease. Electronically Signed   By: Ulyses Jarred M.D.   On: 01/03/2018 16:31   Pertinent labs & imaging results that were available during my care of the patient were reviewed by me and considered in my medical decision making (see chart for details).  Medications Ordered in ED Medications  gi cocktail (Maalox,Lidocaine,Donnatal) (30 mLs Oral Given 01/03/18 1649)  ondansetron (ZOFRAN) injection 4 mg (4 mg Intravenous Given 01/03/18 1650)  sodium chloride 0.9 % bolus 500 mL (0 mLs Intravenous Stopped 01/03/18 1852)                                                                                                                                    Procedures Procedures  (including critical care time)  Medical Decision Making / ED Course I have reviewed the nursing notes for this encounter and the patient's prior records (if available in EHR or on provided paperwork).    62 y.o. male presents with abdominal pain that  radiates to chest and diarrhea for several days. possible suspicious food intake.  adequate oral tolerance. Rest of history as above.  Patient appears well, not in distress, and with no signs of toxicity or dehydration. Abdomen with mild right sided discomfort. Not peritonitic. Rest of the exam as above  Given initial suspicion for chest pain during triage and his significant cardiac history, cardiac w/u was performed and reassuring w/ nonischemic and unchanged EKG and negative serial trops.  Screening labs reassuring w/o leukocytosis, significant electrolyte derangements, renal insufficiency, biliary obstruction or pancreatitis.  Most consistent with viral gastroenteritis vs food poisoning.   Doubt appendicitis, diverticulitis, severe colitis, dysentery.    Able to tolerate oral intake in the ED.  Discussed symptomatic treatment with the patient and they will follow closely with their PCP.   Final Clinical Impression(s) / ED Diagnoses Final diagnoses:  Abdominal cramping  Diarrhea of presumed infectious origin   Disposition: Discharge  Condition: Good  I have discussed the results, Dx and Tx plan with the patient who expressed understanding and agree(s) with the plan. Discharge instructions discussed at great length. The patient was given strict return precautions who verbalized understanding of the instructions. No further questions at time of discharge.    ED Discharge Orders    None       Follow Up: [name], MD [address] [place] [address] [phone]  Schedule an appointment as soon as possible for a visit  in 5-7 days, If symptoms do not improve or  worsen      This chart was dictated using voice recognition software.  Despite best efforts to proofread,  errors can occur which can change the documentation meaning.   Fatima Blank, MD 01/04/18 [phone]

## 2018-01-03 NOTE — ED Triage Notes (Signed)
Diarrhea x 2 days. Today he is having epigastric pain in his abdomen and pain in his chest.

## 2018-01-03 NOTE — ED Notes (Signed)
ED Provider at bedside. 

## 2018-01-09 NOTE — Progress Notes (Signed)
Cardiology Office Note   Date:  01/10/2018   ID:  [name], DOB 10-18-1955, MRN [id number]  PCP:  [name], MD    No chief complaint on file.  CAD  Wt Readings from Last 3 Encounters:  01/10/18 200 lb (90.7 kg)  01/03/18 195 lb (88.5 kg)  10/06/17 195 lb 15.8 oz (88.9 kg)       History of Present Illness: [name] is a 62 y.o. male  with CAD, s/p anterior STEMI and emergent CABG in 2017.   He has had multiple episodes of chest pain since then and multiple caths in 2018 and a stress test not resulting in PCI.  He admits to having had some anxiety as well.  Medications have been difficult to tolerate for his anxiety at times.   In 2/19, he had a cath and PCI of the RCA due to narrowing of the graft to the distal RCA.   Since the RCA stent, he has felt much better.  No NTG use.  He feels he has difficulty losing weight.  He felt that he gained water with with eating out.    Denies : Chest pain. Dizziness.  Nitroglycerin use. Orthopnea. Palpitations. Paroxysmal nocturnal dyspnea. Shortness of breath. Syncope.   Feels some minimal ankle swelling at the end of the day when removing socks.    Past Medical History:  Diagnosis Date  . Anxiety   . Basal cell carcinoma (BCC) of forehead   . CAD (coronary artery disease)    a. 10/2015 ant STEMI >> LHC with 3 v CAD; oLAD tx with POBA >> emergent CABG. b. Multiple evals since that time, early graft failure of SVG-RCA by cath 03/2016. c. 2/19 PCI/DES x1 to pRCA, normal EF.  Marland Kitchen Depression   . Dyspnea   . Esophageal reflux    eosinophil esophagitis  . Family history of adverse reaction to anesthesia    "sister has PONV" (06/21/2017)  . Former tobacco use   . Gout   . Hepatitis C    "treated and cured" (06/21/2017)  . High cholesterol   . Hypertension   . Ischemic cardiomyopathy    a. EF 25-30% at intraop TEE 4/17  //  b. Limited Echo 5/17 - EF 45-50%, mild ant HK. c. EF 55-65% by cath 09/2017.  . Migraine    "3-4/yr" (06/21/2017)  . Myocardial infarction ([place]) 10/2015  . Sinus bradycardia    a. HR dropping into 40s in 02/2016 -> BB reduced.  . Stroke ([place]) 10/2016   "small one; sometimes my memory/cognitive issues" (06/21/2017)  . Symptomatic hypotension    a. 02/2016 ER visit -> meds reduced.    Past Surgical History:  Procedure Laterality Date  . BASAL CELL CARCINOMA EXCISION     "forehead  . CARDIAC CATHETERIZATION N/A 11/28/2015   Procedure: Left Heart Cath and Coronary Angiography;  Surgeon: [name], MD;  Location: [place] [facility];  Service: Cardiovascular;  Laterality: N/A;  . CARDIAC CATHETERIZATION N/A 11/28/2015   Procedure: Coronary Balloon Angioplasty;  Surgeon: [name], MD;  Location: [place] [facility];  Service: Cardiovascular;  Laterality: N/A;  ostial LAD  . CARDIAC CATHETERIZATION N/A 11/28/2015   Procedure: Coronary/Graft Angiography;  Surgeon: [name], MD;  Location: [place] [facility];  Service: Cardiovascular;  Laterality: N/A;  coronaries only   . CARDIAC CATHETERIZATION N/A 04/21/2016   Procedure: Left Heart Cath and Coronary Angiography;  Surgeon: Wellington Hampshire, MD;  Location: [facility]  INVASIVE [facility];  Service: Cardiovascular;  Laterality: N/A;  . CARDIAC CATHETERIZATION N/A 06/14/2016   Procedure: Left Heart Cath and Cors/Grafts Angiography;  Surgeon: Lorretta Harp, MD;  Location: [place] [facility];  Service: Cardiovascular;  Laterality: N/A;  . CARDIAC CATHETERIZATION N/A 09/08/2016   Procedure: Left Heart Cath and Cors/Grafts Angiography;  Surgeon: Wellington Hampshire, MD;  Location: [place] [facility];  Service: Cardiovascular;  Laterality: N/A;  . CARDIAC CATHETERIZATION    . CORONARY ARTERY BYPASS GRAFT N/A 11/28/2015   Procedure: CORONARY ARTERY BYPASS GRAFTING (CABG) TIMES FIVE USING LEFT INTERNAL MAMMARY ARTERY AND RIGHT GREATER SAPHENOUS,VIEN HARVEATED BY ENDOVIEN, INTRAOPPRATIVE TEE;  Surgeon: Gaye Pollack, MD;  Location: [place];  Service: Open Heart Surgery;  Laterality: N/A;  . CORONARY STENT INTERVENTION N/A 10/05/2017   Procedure: CORONARY STENT INTERVENTION;  Surgeon: [name], MD;  Location: [place] [facility];  Service: Cardiovascular;  Laterality: N/A;  . HUMERUS SURGERY Right 1969   "tumor inside bone; filled it w/bone chips"  . LEFT HEART CATH AND CORS/GRAFTS ANGIOGRAPHY N/A 03/11/2017   Procedure: Left Heart Cath and Cors/Grafts Angiography;  Surgeon: Leonie Man, MD;  Location: [place] [facility];  Service: Cardiovascular;  Laterality: N/A;  . LEFT HEART CATH AND CORS/GRAFTS ANGIOGRAPHY N/A 10/05/2017   Procedure: LEFT HEART CATH AND CORS/GRAFTS ANGIOGRAPHY;  Surgeon: [name], MD;  Location: [place] [facility];  Service: Cardiovascular;  Laterality: N/A;  . PERIPHERAL VASCULAR CATHETERIZATION N/A 06/14/2016   Procedure: Lower Extremity Angiography;  Surgeon: Lorretta Harp, MD;  Location: [place] [facility];  Service: Cardiovascular;  Laterality: N/A;     Current Outpatient Medications  Medication Sig Dispense Refill  . acetaminophen (TYLENOL) 500 MG tablet Take 1,000 mg by mouth every 6 (six) hours as needed for mild pain or headache.     Marland Kitchen amLODipine (NORVASC) 10 MG tablet Take 1 tablet (10 mg total) by mouth daily. 90 tablet 3  . aspirin 81 MG tablet Take 81 mg by mouth daily.    Marland Kitchen atorvastatin (LIPITOR) 80 MG tablet Take 1 tablet (80 mg total) by mouth daily. 30 tablet 10  . chlordiazePOXIDE (LIBRIUM) 10 MG capsule Take 10 mg by mouth 4 (four) times daily as needed for anxiety.     . clopidogrel (PLAVIX) 75 MG tablet take 1 tablet by mouth once daily 30 tablet 11  . fluticasone (FLONASE) 50 MCG/ACT nasal spray Place 1 spray into both nostrils daily.  0  . lisinopril (PRINIVIL,ZESTRIL) 5 MG tablet Take 5 mg by mouth daily. May take 1 additional tablet once daily for SBP greater than 150 once hour after taking daily dose    . metoprolol succinate (TOPROL-XL) 25 MG 24 hr  tablet Take 1 tablet (25 mg total) by mouth daily. 90 tablet 3  . Multiple Vitamins-Minerals (ONE-A-DAY MENS 50+ ADVANTAGE PO) Take 1 tablet by mouth daily.     . nitroGLYCERIN (NITROSTAT) 0.4 MG SL tablet Place 1 tablet (0.4 mg total) under the tongue every 5 (five) minutes as needed for chest pain. 3 DOSE MAX 25 tablet 3  . olopatadine (PATANOL) 0.1 % ophthalmic solution Place 1 drop into both eyes as needed for allergies.   0  . pantoprazole (PROTONIX) 40 MG tablet Take 40 mg by mouth daily.    . ranolazine (RANEXA) 1000 MG SR tablet Take 1,000 mg by mouth daily.    . traZODone (DESYREL) 50 MG tablet Take 50-150 mg by mouth  at bedtime as needed for sleep.      No current facility-administered medications for this visit.     Allergies:   Prednisone; Tetanus toxoids; Wellbutrin [bupropion]; and Chantix [varenicline]    Social History:  The patient  reports that he quit smoking about 2 years ago. His smoking use included cigarettes. He has a 33.00 pack-year smoking history. He has never used smokeless tobacco. He reports that he does not drink alcohol or use drugs.   Family History:  The patient's family history includes Heart Problems in his father; Heart attack in his maternal grandmother and paternal uncle; Heart attack (age of onset: 68) in his father; Heart failure in his father; Hypertension in his brother; Lung cancer in his mother; Stroke in his father and maternal grandmother.    ROS:  Please see the history of present illness.   Otherwise, review of systems are positive for .   All other systems are reviewed and negative.    PHYSICAL EXAM: VS:  BP 118/88   Pulse 77   Ht 5\' 9"  (1.753 m)   Wt 200 lb (90.7 kg)   SpO2 97%   BMI 29.53 kg/m  , BMI Body mass index is 29.53 kg/m. GEN: Well nourished, well developed, in no acute distress  HEENT: normal  Neck: no JVD, carotid bruits, or masses Cardiac: RRR; no murmurs, rubs, or gallops,no edema  Respiratory:  clear to auscultation  bilaterally, normal work of breathing GI: soft, nontender, nondistended, + BS MS: no deformity or atrophy  Skin: warm and dry, no rash Neuro:  Strength and sensation are intact Psych: euthymic mood, full affect     Recent Labs: 06/21/2017: Magnesium 2.1; TSH 0.817 10/04/2017: B Natriuretic Peptide 57.0 01/03/2018: ALT 15; BUN 20; Creatinine, Ser 0.99; Hemoglobin 14.1; Platelets 197; Potassium 3.8; Sodium 141   Lipid Panel    Component Value Date/Time   CHOL 114 11/08/2017 0833   TRIG 93 11/08/2017 0833   HDL 52 11/08/2017 0833   CHOLHDL 2.2 11/08/2017 0833   CHOLHDL 2.2 06/22/2017 0256   VLDL 27 06/22/2017 0256   LDLCALC 43 11/08/2017 0833     Other studies Reviewed: Additional studies/ records that were reviewed today with results demonstrating: Catheter results reviewed.   ASSESSMENT AND PLAN:  1. CAD: h/o emergent CABG post PTCA of the LAD.  He had PCI of the RCA.  Angina much better controlled since that PCI.  Continue aggressive secondary prevention. 2. Hyperlipidemia: Continue atorvastatin.  LDL well controlled. 3. 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  He does report some fluid overload on occasion, after eating out at restaurants most recently.  This is likely due to high salt intake.  I have asked him to try to watch his salt intake.  He should try to avoid simple sugars as well while he tries to lose weight.  Continue regular exercise. 4. Can elevate legs at night or minimal edema that is reported.   Current medicines are reviewed at length with the patient today.  The patient concerns regarding his medicines were addressed.  The following changes have been made:  No change  Labs/ tests ordered today include:  No orders of the defined types were placed in this encounter.   Recommend 150 minutes/week of aerobic exercise Low fat, low carb, high fiber diet recommended  Disposition:   FU in 6 months   Signed, Larae Grooms, MD  01/10/2018 11:08 AM    [facility] [place],  [place]  [address] Phone: [phone]; Fax: [phone]

## 2018-01-10 ENCOUNTER — Encounter: Payer: Self-pay | Admitting: Interventional Cardiology

## 2018-01-10 ENCOUNTER — Ambulatory Visit: Payer: BC Managed Care – PPO | Admitting: Interventional Cardiology

## 2018-01-10 VITALS — BP 118/88 | HR 77 | Ht 69.0 in | Wt 200.0 lb

## 2018-01-10 DIAGNOSIS — I252 Old myocardial infarction: Secondary | ICD-10-CM | POA: Diagnosis not present

## 2018-01-10 DIAGNOSIS — I25119 Atherosclerotic heart disease of native coronary artery with unspecified angina pectoris: Secondary | ICD-10-CM

## 2018-01-10 DIAGNOSIS — E7800 Pure hypercholesterolemia, unspecified: Secondary | ICD-10-CM

## 2018-01-10 DIAGNOSIS — Z951 Presence of aortocoronary bypass graft: Secondary | ICD-10-CM

## 2018-01-10 NOTE — Patient Instructions (Signed)
Medication Instructions:  Your physician recommends that you continue on your current medications as directed. Please refer to the Current Medication list given to you today.   Labwork: None ordered  Testing/Procedures: None ordered  Follow-Up: Your physician wants you to follow-up in: 6 months with Dr. Varanasi. You will receive a reminder letter in the mail two months in advance. If you don't receive a letter, please call our office to schedule the follow-up appointment.   Any Other Special Instructions Will Be Listed Below (If Applicable).     If you need a refill on your cardiac medications before your next appointment, please call your pharmacy.   

## 2018-01-17 ENCOUNTER — Other Ambulatory Visit: Payer: Self-pay | Admitting: Interventional Cardiology

## 2018-02-06 ENCOUNTER — Other Ambulatory Visit: Payer: Self-pay

## 2018-02-06 ENCOUNTER — Other Ambulatory Visit (HOSPITAL_COMMUNITY): Payer: Self-pay

## 2018-02-07 ENCOUNTER — Encounter (HOSPITAL_COMMUNITY): Payer: Self-pay | Admitting: Emergency Medicine

## 2018-02-07 ENCOUNTER — Emergency Department (HOSPITAL_COMMUNITY): Payer: BC Managed Care – PPO

## 2018-02-07 ENCOUNTER — Other Ambulatory Visit: Payer: Self-pay

## 2018-02-07 ENCOUNTER — Inpatient Hospital Stay (HOSPITAL_COMMUNITY)
Admission: EM | Admit: 2018-02-07 | Discharge: 2018-02-09 | DRG: 552 | Disposition: A | Discharge status: Home / Self Care | Payer: BC Managed Care – PPO | Attending: Interventional Cardiology | Admitting: Interventional Cardiology

## 2018-02-07 DIAGNOSIS — Z951 Presence of aortocoronary bypass graft: Secondary | ICD-10-CM | POA: Diagnosis not present

## 2018-02-07 DIAGNOSIS — Z887 Allergy status to serum and vaccine status: Secondary | ICD-10-CM

## 2018-02-07 DIAGNOSIS — Z8673 Personal history of transient ischemic attack (TIA), and cerebral infarction without residual deficits: Secondary | ICD-10-CM

## 2018-02-07 DIAGNOSIS — I25110 Atherosclerotic heart disease of native coronary artery with unstable angina pectoris: Secondary | ICD-10-CM | POA: Diagnosis present

## 2018-02-07 DIAGNOSIS — I252 Old myocardial infarction: Secondary | ICD-10-CM | POA: Diagnosis not present

## 2018-02-07 DIAGNOSIS — I255 Ischemic cardiomyopathy: Secondary | ICD-10-CM | POA: Diagnosis not present

## 2018-02-07 DIAGNOSIS — I200 Unstable angina: Secondary | ICD-10-CM | POA: Diagnosis not present

## 2018-02-07 DIAGNOSIS — R0782 Intercostal pain: Secondary | ICD-10-CM | POA: Diagnosis not present

## 2018-02-07 DIAGNOSIS — Z8249 Family history of ischemic heart disease and other diseases of the circulatory system: Secondary | ICD-10-CM

## 2018-02-07 DIAGNOSIS — Z955 Presence of coronary angioplasty implant and graft: Secondary | ICD-10-CM

## 2018-02-07 DIAGNOSIS — Z888 Allergy status to other drugs, medicaments and biological substances status: Secondary | ICD-10-CM

## 2018-02-07 DIAGNOSIS — Z79899 Other long term (current) drug therapy: Secondary | ICD-10-CM

## 2018-02-07 DIAGNOSIS — M5135 Other intervertebral disc degeneration, thoracolumbar region: Secondary | ICD-10-CM | POA: Diagnosis not present

## 2018-02-07 DIAGNOSIS — R079 Chest pain, unspecified: Secondary | ICD-10-CM | POA: Diagnosis present

## 2018-02-07 DIAGNOSIS — I25700 Atherosclerosis of coronary artery bypass graft(s), unspecified, with unstable angina pectoris: Secondary | ICD-10-CM | POA: Diagnosis not present

## 2018-02-07 DIAGNOSIS — I471 Supraventricular tachycardia: Secondary | ICD-10-CM | POA: Diagnosis not present

## 2018-02-07 DIAGNOSIS — R002 Palpitations: Secondary | ICD-10-CM

## 2018-02-07 DIAGNOSIS — E785 Hyperlipidemia, unspecified: Secondary | ICD-10-CM | POA: Diagnosis not present

## 2018-02-07 DIAGNOSIS — I25119 Atherosclerotic heart disease of native coronary artery with unspecified angina pectoris: Secondary | ICD-10-CM | POA: Diagnosis present

## 2018-02-07 DIAGNOSIS — Z7902 Long term (current) use of antithrombotics/antiplatelets: Secondary | ICD-10-CM | POA: Diagnosis not present

## 2018-02-07 DIAGNOSIS — F419 Anxiety disorder, unspecified: Secondary | ICD-10-CM | POA: Diagnosis present

## 2018-02-07 DIAGNOSIS — K219 Gastro-esophageal reflux disease without esophagitis: Secondary | ICD-10-CM | POA: Diagnosis not present

## 2018-02-07 DIAGNOSIS — I2510 Atherosclerotic heart disease of native coronary artery without angina pectoris: Secondary | ICD-10-CM | POA: Diagnosis present

## 2018-02-07 DIAGNOSIS — Z720 Tobacco use: Secondary | ICD-10-CM | POA: Diagnosis present

## 2018-02-07 DIAGNOSIS — E7800 Pure hypercholesterolemia, unspecified: Secondary | ICD-10-CM | POA: Diagnosis not present

## 2018-02-07 DIAGNOSIS — Z87891 Personal history of nicotine dependence: Secondary | ICD-10-CM | POA: Diagnosis not present

## 2018-02-07 DIAGNOSIS — I10 Essential (primary) hypertension: Secondary | ICD-10-CM | POA: Diagnosis present

## 2018-02-07 DIAGNOSIS — Z7982 Long term (current) use of aspirin: Secondary | ICD-10-CM

## 2018-02-07 DIAGNOSIS — Z823 Family history of stroke: Secondary | ICD-10-CM | POA: Diagnosis not present

## 2018-02-07 HISTORY — DX: Other supraventricular tachycardia: I47.19

## 2018-02-07 HISTORY — DX: Supraventricular tachycardia: I47.1

## 2018-02-07 LAB — CBC
HCT: 41.6 % (ref 39.0–52.0)
HCT: 42.3 % (ref 39.0–52.0)
Hemoglobin: 13.9 g/dL (ref 13.0–17.0)
Hemoglobin: 13.9 g/dL (ref 13.0–17.0)
MCH: 30.5 pg (ref 26.0–34.0)
MCH: 30 pg (ref 26.0–34.0)
MCHC: 33.4 g/dL (ref 30.0–36.0)
MCHC: 32.9 g/dL (ref 30.0–36.0)
MCV: 91.2 fL (ref 78.0–100.0)
MCV: 91.4 fL (ref 78.0–100.0)
Platelets: 205 10*3/uL (ref 150–400)
Platelets: 222 10*3/uL (ref 150–400)
RBC: 4.56 MIL/uL (ref 4.22–5.81)
RBC: 4.63 MIL/uL (ref 4.22–5.81)
RDW: 12.3 % (ref 11.5–15.5)
RDW: 12.2 % (ref 11.5–15.5)
WBC: 7.9 10*3/uL (ref 4.0–10.5)
WBC: 8.5 10*3/uL (ref 4.0–10.5)

## 2018-02-07 LAB — D-DIMER, QUANTITATIVE (NOT AT ARMC): D-Dimer, Quant: <0.27 ug/mL-FEU (ref 0.00–0.50)

## 2018-02-07 LAB — CREATININE, SERUM
Creatinine, Ser: 1.08 mg/dL (ref 0.61–1.24)
GFR calc Af Amer: >60 mL/min (ref >60)
GFR calc non Af Amer: >60 mL/min (ref >60)

## 2018-02-07 LAB — BASIC METABOLIC PANEL
Anion gap: 8 (ref 5–15)
BUN: 18 mg/dL (ref 6–20)
CO2: 25 mmol/L (ref 22–32)
Calcium: 9.3 mg/dL (ref 8.9–10.3)
Chloride: 107 mmol/L (ref 101–111)
Creatinine, Ser: 1.17 mg/dL (ref 0.61–1.24)
GFR calc Af Amer: >60 mL/min (ref >60)
GFR calc non Af Amer: >60 mL/min (ref >60)
Glucose, Bld: 103 mg/dL — ABNORMAL HIGH (ref 65–99)
Potassium: 4.3 mmol/L (ref 3.5–5.1)
Sodium: 140 mmol/L (ref 135–145)

## 2018-02-07 LAB — TROPONIN I
Troponin I: <0.03 ng/mL (ref <0.03)
Troponin I: <0.03 ng/mL (ref <0.03)

## 2018-02-07 LAB — I-STAT TROPONIN, ED: Troponin i, poc: 0 ng/mL (ref 0.00–0.08)

## 2018-02-07 LAB — BRAIN NATRIURETIC PEPTIDE: B Natriuretic Peptide: 28 pg/mL (ref 0.0–100.0)

## 2018-02-07 LAB — TSH: TSH: 1.319 u[IU]/mL (ref 0.350–4.500)

## 2018-02-07 LAB — MAGNESIUM: Magnesium: 2.1 mg/dL (ref 1.7–2.4)

## 2018-02-07 MED ORDER — ATORVASTATIN CALCIUM 80 MG PO TABS
80.0000 mg | ORAL_TABLET | Freq: Every day | ORAL | Status: DC
  Administered 2018-02-07 – 2018-02-09 (×3): 80 mg via ORAL
  Filled 2018-02-07: qty 1
  Filled 2018-02-07 (×2): qty 4
  Filled 2018-02-07: qty 1

## 2018-02-07 MED ORDER — HYDROMORPHONE HCL 2 MG/ML IJ SOLN
0.5000 mg | Freq: Once | INTRAMUSCULAR | Status: AC
  Administered 2018-02-07: 0.5 mg via INTRAVENOUS
  Filled 2018-02-07: qty 1

## 2018-02-07 MED ORDER — CLOPIDOGREL BISULFATE 75 MG PO TABS
75.0000 mg | ORAL_TABLET | Freq: Every day | ORAL | Status: DC
  Administered 2018-02-07 – 2018-02-09 (×3): 75 mg via ORAL
  Filled 2018-02-07 (×3): qty 1

## 2018-02-07 MED ORDER — PANTOPRAZOLE SODIUM 40 MG PO TBEC
40.0000 mg | DELAYED_RELEASE_TABLET | Freq: Every day | ORAL | Status: DC
  Administered 2018-02-07 – 2018-02-09 (×3): 40 mg via ORAL
  Filled 2018-02-07 (×3): qty 1

## 2018-02-07 MED ORDER — RANOLAZINE ER 500 MG PO TB12
1000.0000 mg | ORAL_TABLET | Freq: Every day | ORAL | Status: DC
  Administered 2018-02-07 – 2018-02-09 (×3): 1000 mg via ORAL
  Filled 2018-02-07 (×3): qty 2

## 2018-02-07 MED ORDER — LISINOPRIL 5 MG PO TABS
5.0000 mg | ORAL_TABLET | Freq: Every day | ORAL | Status: DC
Start: 2018-02-07 — End: 2018-02-09
  Administered 2018-02-07 – 2018-02-09 (×3): 5 mg via ORAL
  Filled 2018-02-07 (×4): qty 1

## 2018-02-07 MED ORDER — ONDANSETRON HCL 4 MG/2ML IJ SOLN
4.0000 mg | Freq: Once | INTRAMUSCULAR | Status: AC
  Administered 2018-02-07: 4 mg via INTRAVENOUS
  Filled 2018-02-07: qty 2

## 2018-02-07 MED ORDER — ACETAMINOPHEN 325 MG PO TABS: 650.0000 mg | ORAL_TABLET | ORAL | Status: DC | PRN

## 2018-02-07 MED ORDER — NITROGLYCERIN 0.4 MG SL SUBL
0.4000 mg | SUBLINGUAL_TABLET | SUBLINGUAL | Status: DC | PRN
  Administered 2018-02-07 (×2): 0.4 mg via SUBLINGUAL
  Filled 2018-02-07 (×2): qty 1

## 2018-02-07 MED ORDER — METOPROLOL SUCCINATE ER 50 MG PO TB24
50.0000 mg | ORAL_TABLET | Freq: Every day | ORAL | Status: DC
  Administered 2018-02-07 – 2018-02-08 (×2): 50 mg via ORAL
  Filled 2018-02-07 (×2): qty 1

## 2018-02-07 MED ORDER — HEPARIN SODIUM (PORCINE) 5000 UNIT/ML IJ SOLN
5000.0000 [IU] | Freq: Three times a day (TID) | INTRAMUSCULAR | Status: DC
  Administered 2018-02-07 – 2018-02-09 (×7): 5000 [IU] via SUBCUTANEOUS
  Filled 2018-02-07 (×7): qty 1

## 2018-02-07 MED ORDER — AMLODIPINE BESYLATE 10 MG PO TABS
10.0000 mg | ORAL_TABLET | Freq: Every day | ORAL | Status: DC
  Administered 2018-02-07 – 2018-02-09 (×3): 10 mg via ORAL
  Filled 2018-02-07 (×3): qty 1

## 2018-02-07 MED ORDER — ONDANSETRON HCL 4 MG/2ML IJ SOLN: 4.0000 mg | Freq: Four times a day (QID) | INTRAMUSCULAR | Status: DC | PRN

## 2018-02-07 MED ORDER — MORPHINE SULFATE (PF) 4 MG/ML IV SOLN
2.0000 mg | INTRAVENOUS | Status: DC | PRN
Start: 2018-02-07 — End: 2018-02-08
  Administered 2018-02-07 – 2018-02-08 (×4): 2 mg via INTRAVENOUS
  Filled 2018-02-07 (×5): qty 1

## 2018-02-07 MED ORDER — ASPIRIN 81 MG PO TABS: 81.0000 mg | ORAL_TABLET | Freq: Every day | ORAL | Status: DC

## 2018-02-07 MED ORDER — TRAZODONE HCL 50 MG PO TABS: 50.0000 mg | ORAL_TABLET | Freq: Every evening | ORAL | Status: DC | PRN

## 2018-02-07 MED ORDER — NITROGLYCERIN 0.4 MG SL SUBL
0.4000 mg | SUBLINGUAL_TABLET | SUBLINGUAL | Status: DC | PRN
  Administered 2018-02-07 (×2): 0.4 mg via SUBLINGUAL
  Filled 2018-02-07: qty 1

## 2018-02-07 MED ORDER — ASPIRIN EC 81 MG PO TBEC
81.0000 mg | DELAYED_RELEASE_TABLET | Freq: Every day | ORAL | Status: DC
  Administered 2018-02-08 – 2018-02-09 (×2): 81 mg via ORAL
  Filled 2018-02-07 (×2): qty 1

## 2018-02-07 NOTE — ED Notes (Signed)
EKG was done, placed in medical records so it may be placed in chart as for some reason it is not crossing over.

## 2018-02-07 NOTE — ED Notes (Signed)
Dr.Smith notified that 3E will not take pt

## 2018-02-07 NOTE — ED Notes (Signed)
Bed placement notified that 3E will not take pt.

## 2018-02-07 NOTE — ED Notes (Signed)
Charge RN on [facility] states that pt can not come to that floor, exclusion criteria is active chest pain.  Will notify bed placement so appropriate bed can be assigned

## 2018-02-07 NOTE — ED Provider Notes (Signed)
[name] Provider Note   CSN: [id number] Arrival date & time: 02/07/18  0732     History   Chief Complaint Chief Complaint  Patient presents with  . Chest Pain    HPI [name] is a 62 y.o. male.  HPI [name] is a 62 y.o. male with hx of ischemic cardiomyopathy, MI, bradycradia, CVA, HTN, presents to ED with complaint of chest pain. Pt with palpitations since last night.  States felt like heart was racing and pounding. He states he was able to go to sleep, but woke up around 2am nausea and episode of emesis.  This morning states that palpitations were still there, and started having left-sided chest tightness.  He states  that the pain he is feeling is similar to when he had a prior MI, only not as severe.  He did not have palpitations with last MI.  He states that he checked his blood pressure and heart rate at home and his heart rate was over 110, blood pressure was high.  He states that his pulse normally runs in the 50s and 60s.  Is currently not feeling any palpitations but states he still having pain.  He took one sublingual nitroglycerin at home which helps some.  His last stenting was in February 2019, since then he has been doing really well and has not had much chest pain.  His last cardiology visit was last month. No recent travel. No surgeries. No extremity swelling.     Past Medical History:  Diagnosis Date  . Anxiety   . Basal cell carcinoma (BCC) of forehead   . CAD (coronary artery disease)    a. 10/2015 ant STEMI >> LHC with 3 v CAD; oLAD tx with POBA >> emergent CABG. b. Multiple evals since that time, early graft failure of SVG-RCA by cath 03/2016. c. 2/19 PCI/DES x1 to pRCA, normal EF.  Marland Kitchen Depression   . Dyspnea   . Esophageal reflux    eosinophil esophagitis  . Family history of adverse reaction to anesthesia    "sister has PONV" (06/21/2017)  . Former tobacco use   . Gout   . Hepatitis C    "treated and  cured" (06/21/2017)  . High cholesterol   . Hypertension   . Ischemic cardiomyopathy    a. EF 25-30% at intraop TEE 4/17  //  b. Limited Echo 5/17 - EF 45-50%, mild ant HK. c. EF 55-65% by cath 09/2017.  . Migraine    "3-4/yr" (06/21/2017)  . Myocardial infarction ([place]) 10/2015  . Sinus bradycardia    a. HR dropping into 40s in 02/2016 -> BB reduced.  . Stroke ([place]) 10/2016   "small one; sometimes my memory/cognitive issues" (06/21/2017)  . Symptomatic hypotension    a. 02/2016 ER visit -> meds reduced.    Patient Active Problem List   Diagnosis Date Noted  . Chest pain 03/10/2017  . Family hx-stroke 11/10/2016  . Stroke-like episode ([place]) - R brain, s/p tPA 11/09/2016  . Unstable angina ([place]) 09/07/2016  . Claudication of both lower extremities ([place])   . Pure hypercholesterolemia   . Tobacco abuse disorder   . Coronary artery disease involving coronary bypass graft of native heart with unstable angina pectoris ([place])   . Chest pain at rest 06/10/2016  . Abnormal nuclear stress test - HIGH RISK 04/20/2016  . Old MI (myocardial infarction)   . Essential hypertension 02/26/2016  . Ischemic cardiomyopathy 12/25/2015  . Dyslipidemia, goal LDL  below 70 12/25/2015  . STEMI (ST elevation myocardial infarction) ([place]) 11/28/2015  . 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 Coronary artery disease involving native coronary artery of native heart with unstable angina pectoris ([place]) 11/28/2015  . 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 Acute MI anterior wall first episode care [facility])   . Chest pain with high risk for cardiac etiology 03/07/2015  . Dysphagia 03/07/2015  . Gout attack 03/07/2015  . Mixed bipolar I disorder ([place]) 03/07/2015  . Fibromyalgia 07/09/2014  . Gout 07/09/2014  . Anxiety 07/09/2014  . Depression 07/09/2014  . Hepatitis C 11/20/2012  . Eosinophilic esophagitis 52/77/8242    Past Surgical History:  Procedure Laterality Date  . BASAL CELL CARCINOMA EXCISION      "forehead  . CARDIAC CATHETERIZATION N/A 11/28/2015   Procedure: Left Heart Cath and Coronary Angiography;  Surgeon: [name], MD;  Location: [place] [facility];  Service: Cardiovascular;  Laterality: N/A;  . CARDIAC CATHETERIZATION N/A 11/28/2015   Procedure: Coronary Balloon Angioplasty;  Surgeon: [name], MD;  Location: [place] [facility];  Service: Cardiovascular;  Laterality: N/A;  ostial LAD  . CARDIAC CATHETERIZATION N/A 11/28/2015   Procedure: Coronary/Graft Angiography;  Surgeon: [name], MD;  Location: [place] [facility];  Service: Cardiovascular;  Laterality: N/A;  coronaries only   . CARDIAC CATHETERIZATION N/A 04/21/2016   Procedure: Left Heart Cath and Coronary Angiography;  Surgeon: Wellington Hampshire, MD;  Location: [place] [facility];  Service: Cardiovascular;  Laterality: N/A;  . CARDIAC CATHETERIZATION N/A 06/14/2016   Procedure: Left Heart Cath and Cors/Grafts Angiography;  Surgeon: Lorretta Harp, MD;  Location: [place] [facility];  Service: Cardiovascular;  Laterality: N/A;  . CARDIAC CATHETERIZATION N/A 09/08/2016   Procedure: Left Heart Cath and Cors/Grafts Angiography;  Surgeon: Wellington Hampshire, MD;  Location: [place] [facility];  Service: Cardiovascular;  Laterality: N/A;  . CARDIAC CATHETERIZATION    . CORONARY ARTERY BYPASS GRAFT N/A 11/28/2015   Procedure: CORONARY ARTERY BYPASS GRAFTING (CABG) TIMES FIVE USING LEFT INTERNAL MAMMARY ARTERY AND RIGHT GREATER SAPHENOUS,VIEN HARVEATED BY ENDOVIEN, INTRAOPPRATIVE TEE;  Surgeon: Gaye Pollack, MD;  Location: [place];  Service: Open Heart Surgery;  Laterality: N/A;  . CORONARY STENT INTERVENTION N/A 10/05/2017   Procedure: CORONARY STENT INTERVENTION;  Surgeon: [name], MD;  Location: [place] [facility];  Service: Cardiovascular;  Laterality: N/A;  . HUMERUS SURGERY Right 1969   "tumor inside bone; filled it w/bone chips"  . LEFT HEART CATH AND CORS/GRAFTS ANGIOGRAPHY N/A 03/11/2017     Procedure: Left Heart Cath and Cors/Grafts Angiography;  Surgeon: Leonie Man, MD;  Location: [place] [facility];  Service: Cardiovascular;  Laterality: N/A;  . LEFT HEART CATH AND CORS/GRAFTS ANGIOGRAPHY N/A 10/05/2017   Procedure: LEFT HEART CATH AND CORS/GRAFTS ANGIOGRAPHY;  Surgeon: [name], MD;  Location: [place] [facility];  Service: Cardiovascular;  Laterality: N/A;  . PERIPHERAL VASCULAR CATHETERIZATION N/A 06/14/2016   Procedure: Lower Extremity Angiography;  Surgeon: Lorretta Harp, MD;  Location: [place] [facility];  Service: Cardiovascular;  Laterality: N/A;        Home Medications    Prior to Admission medications   Medication Sig Start Date End Date Taking? Authorizing Provider  acetaminophen (TYLENOL) 500 MG tablet Take 1,000 mg by mouth every 6 (six) hours as needed for mild pain or headache.     [provider]  amLODipine (NORVASC) 10  MG tablet Take 1 tablet (10 mg total) by mouth daily. 06/22/17   Strader, Fransisco Hertz, PA-C  aspirin 81 MG tablet Take 81 mg by mouth daily.    [provider]  atorvastatin (LIPITOR) 80 MG tablet Take 1 tablet (80 mg total) by mouth daily. 08/16/17   [name], MD  chlordiazePOXIDE (LIBRIUM) 10 MG capsule Take 10 mg by mouth 4 (four) times daily as needed for anxiety.     [provider]  clopidogrel (PLAVIX) 75 MG tablet take 1 tablet by mouth once daily 01/25/17   [name] T, PA-C  fluticasone (FLONASE) 50 MCG/ACT nasal spray Place 1 spray into both nostrils daily. 05/26/17   [provider]  lisinopril (PRINIVIL,ZESTRIL) 5 MG tablet Take 5 mg by mouth daily. May take 1 additional tablet once daily for SBP greater than 150 once hour after taking daily dose 01/18/18   [name], MD  metoprolol succinate (TOPROL-XL) 25 MG 24 hr tablet Take 1 tablet (25 mg total) by mouth daily. 06/23/17   Strader, Fransisco Hertz, PA-C  Multiple Vitamins-Minerals (ONE-A-DAY MENS 50+  ADVANTAGE PO) Take 1 tablet by mouth daily.     [provider]  nitroGLYCERIN (NITROSTAT) 0.4 MG SL tablet Place 1 tablet (0.4 mg total) under the tongue every 5 (five) minutes as needed for chest pain. 3 DOSE MAX 06/22/17   Strader, [place] M, PA-C  olopatadine (PATANOL) 0.1 % ophthalmic solution Place 1 drop into both eyes as needed for allergies.  05/26/17   [provider]  pantoprazole (PROTONIX) 40 MG tablet Take 40 mg by mouth daily.    [provider]  ranolazine (RANEXA) 1000 MG SR tablet Take 1,000 mg by mouth daily.    [provider]  traZODone (DESYREL) 50 MG tablet Take 50-150 mg by mouth at bedtime as needed for sleep.  12/08/16   [provider]    Family History Family History  Problem Relation Age of Onset  . Lung cancer Mother   . Heart Problems Father   . Heart attack Father 64  . Stroke Father   . Heart failure Father   . Heart attack Maternal Grandmother   . Stroke Maternal Grandmother   . Heart attack Paternal Uncle   . Hypertension Brother   . Autoimmune disease Neg Hx     Social History Social History   Tobacco Use  . Smoking status: Former Smoker    Packs/day: 0.75    Years: 44.00    Pack years: 33.00    Types: Cigarettes    Last attempt to quit: 11/28/2015    Years since quitting: 2.1  . Smokeless tobacco: Never Used  Substance Use Topics  . Alcohol use: No    Frequency: Never    Comment: 06/21/2017 "I'll have a few drinks q couple months"  . Drug use: No    Comment: 06/21/2017 "nothing since the 1980s"     Allergies   Prednisone; Tetanus toxoids; Wellbutrin [bupropion]; and Chantix [varenicline]   Review of Systems Review of Systems  Constitutional: Negative for chills and fever.  Respiratory: Positive for chest tightness. Negative for cough and shortness of breath.   Cardiovascular: Positive for chest pain and palpitations. Negative for leg swelling.  Gastrointestinal: Negative for abdominal  distention, abdominal pain, diarrhea, nausea and vomiting.  Genitourinary: Negative for dysuria, frequency, hematuria and urgency.  Musculoskeletal: Negative for arthralgias, myalgias, neck pain and neck stiffness.  Skin: Negative for rash.  Allergic/Immunologic: Negative for immunocompromised  state.  Neurological: Negative for dizziness, weakness, light-headedness, numbness and headaches.  All other systems reviewed and are negative.    Physical Exam Updated Vital Signs BP 124/89 (BP Location: Left Arm)   Pulse 100   Temp 98.6 F (37 C) (Oral)   Resp 18   Ht 5\' 9"  (1.753 m)   Wt 86.2 kg (190 lb)   SpO2 98%   BMI 28.06 kg/m   Physical Exam  Constitutional: He appears well-developed and well-nourished. No distress.  HENT:  Head: Normocephalic and atraumatic.  Eyes: Conjunctivae are normal.  Neck: Neck supple.  Cardiovascular: Normal rate, regular rhythm and normal heart sounds.  Pulmonary/Chest: Effort normal. No respiratory distress. He has no wheezes. He has no rales.  Abdominal: Soft. Bowel sounds are normal. He exhibits no distension. There is no tenderness. There is no rebound.  Musculoskeletal: He exhibits no edema.  Neurological: He is alert.  Skin: Skin is warm and dry.  Nursing note and vitals reviewed.    ED Treatments / Results  Labs (all labs ordered are listed, but only abnormal results are displayed) Labs Reviewed  BASIC METABOLIC PANEL - Abnormal; Notable for the following components:      Result Value   Glucose, Bld 103 (*)    All other components within normal limits  CBC  CBC  CREATININE, SERUM  TSH  TROPONIN I  BRAIN NATRIURETIC PEPTIDE  MAGNESIUM  D-DIMER, QUANTITATIVE (NOT AT [facility])  TROPONIN I  TROPONIN I  I-STAT TROPONIN, ED    EKG EKG Interpretation  Date/Time:  Tuesday February 07 2018 07:37:27 EDT Ventricular Rate:  95 PR Interval:    QRS Duration: 97 QT Interval:  371 QTC Calculation: 467 R Axis:   68 Text Interpretation:   Sinus rhythm Probable left atrial enlargement Low voltage, precordial leads similar to prior 5/19 Confirmed by Aletta Edouard [phone]) on 02/07/2018 7:46:10 AM   Radiology Dg Chest 2 View  Result Date: 02/07/2018 CLINICAL DATA:  Chest pain EXAM: CHEST - 2 VIEW COMPARISON:  01/03/2018 FINDINGS: Lungs are clear.  No pleural effusion or pneumothorax. The heart is normal in size. Postsurgical changes related to prior CABG. Degenerative changes of the visualized thoracolumbar spine. Median sternotomy. IMPRESSION: Normal chest radiographs. Electronically Signed   By: Julian Hy M.D.   On: 02/07/2018 08:37    Procedures Procedures (including critical care time)  Medications Ordered in ED Medications  nitroGLYCERIN (NITROSTAT) SL tablet 0.4 mg (has no administration in time range)     Initial Impression / Assessment and Plan / ED Course  I have reviewed the triage vital signs and the nursing notes.  Pertinent labs & imaging results that were available during my care of the patient were reviewed by me and considered in my medical decision making (see chart for details).  Clinical Course as of Feb 08 1519  Tue Feb 07, 6382  6025 62 year old male with prior history of CAD CABG stents here with episodes of palpitation and chest pressure since last evening.  Here his heart rate is in the 80sIs very comfortable but he has had a couple of episodes of some faster heart rates subjectively last evening and this morning and here on the monitor had a brief episode.  His initial troponin is negative electrolytes look fine.  Cardiology has been consulted and will evaluate him in the ED.   [MB]    Clinical Course User Index [MB] Hayden Rasmussen, MD    Patient with history of CABG as well  as stents, last placed 4 months ago, here with increased pain and palpitations.  Normal sinus rhythm on the monitor at this time, vital signs all within normal.  Patient's heart rate is slightly higher than his  regular, otherwise no arrhythmias noticed.  Plan to check labs, troponin, chest x-ray.  Distal pulses present bilaterally, not hypertensive, do not suspect dissection at this time.   9:29 AM Chest x-ray, blood work, initial troponin all negative.  Patient had a episode where his heart rate spiked to 160s and 170s, appears to ben sinus tachycardia on the rhythm strip.  Patient's heart rate is currently back to normal.  He is resting comfortably but states that he is still having severe pain and requesting pain medications.  States Dilaudid works well for him.  We will give him a dose of Dilaudid and consult cardiology.  9:51 AM Spoke with cardiology, will consult.   Cardiology has seen patient, will admit.  Final Clinical Impressions(s) / ED Diagnoses   Final diagnoses:  Unstable angina pectoris [facility])  Palpitations    ED Discharge Orders    None       Jeannett Senior, PA-C 02/07/18 1522    Hayden Rasmussen, MD 02/09/18 [phone]

## 2018-02-07 NOTE — ED Notes (Signed)
ED TO INPATIENT HANDOFF REPORT  Name/Age/Gender [name] 62 y.o. male  Code Status    Code Status Orders  (From admission, onward)        Start     Ordered   02/07/18 1202  Full code  Continuous     02/07/18 1201    Code Status History    Date Active Date Inactive Code Status Order ID Comments User Context   11/13/2017 0727 11/13/2017 1608 Full Code [id number]  [name], PA-C ED   10/04/2017 1707 10/06/2017 1624 Full Code [id number]  [name], DO ED   06/21/2017 1455 06/22/2017 2228 Full Code [id number]  [name], NP ED   03/10/2017 1219 03/13/2017 1440 Full Code [id number]  [name], NP ED   01/04/2017 1955 01/05/2017 2032 Full Code [id number]  [name], MD Inpatient   11/09/2016 1045 11/10/2016 2007 Full Code [id number]  [name], MD ED   09/07/2016 1822 09/09/2016 1835 Full Code [id number]  [name], NP Inpatient   06/10/2016 2358 06/15/2016 1737 Full Code [id number]  [name], MD Inpatient   04/19/2016 1854 04/21/2016 2044 Full Code [id number]  [name], NP Inpatient   03/07/2015 1755 03/08/2015 1441 Full Code [id number]  [name], MD Inpatient   11/19/2012 0037 11/20/2012 2131 Full Code [id number]  [name]., DO Inpatient      Home/SNF/Other Home  Chief Complaint CP  Level of Care/Admitting Diagnosis ED Disposition    ED Disposition Condition [place]: [place] [330076]  Level of Care: Telemetry [5]  Diagnosis: Unstable angina [facility]) [226333]  Admitting Physician: Belva Crome [[id number]]  Attending Physician: Belva Crome [[id number]]  Estimated length of stay: past midnight tomorrow  Certification:: I certify this patient will need inpatient services for at least 2 midnights  PT Class (Do Not Modify): Inpatient [101]  PT Acc Code (Do Not Modify): Private [1]       Medical History Past Medical History:  Diagnosis Date  . Anxiety   . Basal cell  carcinoma (BCC) of forehead   . CAD (coronary artery disease)    a. 10/2015 ant STEMI >> LHC with 3 v CAD; oLAD tx with POBA >> emergent CABG. b. Multiple evals since that time, early graft failure of SVG-RCA by cath 03/2016. c. 2/19 PCI/DES x1 to pRCA, normal EF.  Marland Kitchen Depression   . Dyspnea   . Esophageal reflux    eosinophil esophagitis  . Family history of adverse reaction to anesthesia    "sister has PONV" (06/21/2017)  . Former tobacco use   . Gout   . Hepatitis C    "treated and cured" (06/21/2017)  . High cholesterol   . Hypertension   . Ischemic cardiomyopathy    a. EF 25-30% at intraop TEE 4/17  //  b. Limited Echo 5/17 - EF 45-50%, mild ant HK. c. EF 55-65% by cath 09/2017.  . Migraine    "3-4/yr" (06/21/2017)  . Myocardial infarction ([place]) 10/2015  . Sinus bradycardia    a. HR dropping into 40s in 02/2016 -> BB reduced.  . Stroke ([place]) 10/2016   "small one; sometimes my memory/cognitive issues" (06/21/2017)  . Symptomatic hypotension    a. 02/2016 ER visit -> meds reduced.    Allergies Allergies  Allergen Reactions  . Prednisone Other (See Comments)    States that this med makes him "crazy"  . Tetanus Toxoids Swelling and Other (See  Comments)    Fever, Swelling of the arm   . Wellbutrin [Bupropion] Other (See Comments)    Crazy thoughts, nightmares  . Chantix [Varenicline] Other (See Comments)    Dreams    IV Location/Drains/Wounds Patient Lines/Drains/Airways Status   Active Line/Drains/Airways    Name:   Placement date:   Placement time:   Site:   Days:   Peripheral IV 02/07/18 Left Hand   02/07/18    0737    Hand   less than 1          Labs/Imaging Results for orders placed or performed during the hospital encounter of 02/07/18 (from the past 48 hour(s))  Basic metabolic panel     Status: Abnormal   Collection Time: 02/07/18  7:57 AM  Result Value Ref Range   Sodium 140 135 - 145 mmol/L   Potassium 4.3 3.5 - 5.1 mmol/L   Chloride 107 101 - 111 mmol/L    CO2 25 22 - 32 mmol/L   Glucose, Bld 103 (H) 65 - 99 mg/dL   BUN 18 6 - 20 mg/dL   Creatinine, Ser 1.17 0.61 - 1.24 mg/dL   Calcium 9.3 8.9 - 10.3 mg/dL   GFR calc non Af Amer >60 >60 mL/min   GFR calc Af Amer >60 >60 mL/min    Comment: (NOTE) The eGFR has been calculated using the CKD EPI equation. This calculation has not been validated in all clinical situations. eGFR's persistently <60 mL/min signify possible Chronic Kidney Disease.    Anion gap 8 5 - 15    Comment: Performed at [facility] [address] [place] [address]  CBC     Status: None   Collection Time: 02/07/18  7:57 AM  Result Value Ref Range   WBC 7.9 4.0 - 10.5 K/uL   RBC 4.56 4.22 - 5.81 MIL/uL   Hemoglobin 13.9 13.0 - 17.0 g/dL   HCT 41.6 39.0 - 52.0 %   MCV 91.2 78.0 - 100.0 fL   MCH 30.5 26.0 - 34.0 pg   MCHC 33.4 30.0 - 36.0 g/dL   RDW 12.3 11.5 - 15.5 %   Platelets 205 150 - 400 K/uL    Comment: Performed at [facility], [place] [address], [place], [place] [address]  I-stat troponin, ED     Status: None   Collection Time: 02/07/18  8:10 AM  Result Value Ref Range   Troponin i, poc 0.00 0.00 - 0.08 ng/mL   Comment 3            Comment: Due to the release kinetics of cTnI, a negative result within the first hours of the onset of symptoms does not rule out myocardial infarction with certainty. If myocardial infarction is still suspected, repeat the test at appropriate intervals.   CBC     Status: None   Collection Time: 02/07/18 12:46 PM  Result Value Ref Range   WBC 8.5 4.0 - 10.5 K/uL   RBC 4.63 4.22 - 5.81 MIL/uL   Hemoglobin 13.9 13.0 - 17.0 g/dL   HCT 42.3 39.0 - 52.0 %   MCV 91.4 78.0 - 100.0 fL   MCH 30.0 26.0 - 34.0 pg   MCHC 32.9 30.0 - 36.0 g/dL   RDW 12.2 11.5 - 15.5 %   Platelets 222 150 - 400 K/uL    Comment: Performed at [facility] [address] [place], [place] [address]  Creatinine, serum     Status:  None   Collection Time: 02/07/18 12:46 PM   Result Value Ref Range   Creatinine, Ser 1.08 0.61 - 1.24 mg/dL   GFR calc non Af Amer >60 >60 mL/min   GFR calc Af Amer >60 >60 mL/min    Comment: (NOTE) The eGFR has been calculated using the CKD EPI equation. This calculation has not been validated in all clinical situations. eGFR's persistently <60 mL/min signify possible Chronic Kidney Disease. Performed at [facility], [place] [address] [place], [place] [address]   TSH     Status: None   Collection Time: 02/07/18 12:46 PM  Result Value Ref Range   TSH 1.319 0.350 - 4.500 uIU/mL    Comment: Performed by a 3rd Generation assay with a functional sensitivity of <=0.01 uIU/mL. Performed at [facility], [place] [address], [place], [place] [address]   Troponin I     Status: None   Collection Time: 02/07/18 12:46 PM  Result Value Ref Range   Troponin I <0.03 <0.03 ng/mL    Comment: Performed at [facility] [address] [place], [place] [address]  Brain natriuretic peptide     Status: None   Collection Time: 02/07/18 12:46 PM  Result Value Ref Range   B Natriuretic Peptide 28.0 0.0 - 100.0 pg/mL    Comment: Performed at [facility] [address] [place], [place] [address]  Magnesium     Status: None   Collection Time: 02/07/18 12:46 PM  Result Value Ref Range   Magnesium 2.1 1.7 - 2.4 mg/dL    Comment: Performed at [facility], [place] [address], [place], [place] [address]  D-dimer, quantitative (not at [facility])     Status: None   Collection Time: 02/07/18 12:46 PM  Result Value Ref Range   D-Dimer, Quant <0.27 0.00 - 0.50 ug/mL-FEU    Comment: (NOTE) At the manufacturer cut-off of 0.50 ug/mL FEU, this assay has been documented to exclude PE with a sensitivity and negative predictive value of 97 to 99%.  At this time, this assay has not been approved by the FDA to exclude DVT/VTE. Results should be correlated with clinical presentation. Performed at [facility], [place] [address]  [place], [place] [address]    Dg Chest 2 View  Result Date: 02/07/2018 CLINICAL DATA:  Chest pain EXAM: CHEST - 2 VIEW COMPARISON:  01/03/2018 FINDINGS: Lungs are clear.  No pleural effusion or pneumothorax. The heart is normal in size. Postsurgical changes related to prior CABG. Degenerative changes of the visualized thoracolumbar spine. Median sternotomy. IMPRESSION: Normal chest radiographs. Electronically Signed   By: Julian Hy M.D.   On: 02/07/2018 08:37    Pending Labs Unresulted Labs (From admission, onward)   Start     Ordered   02/08/18 4193  Basic metabolic panel  Tomorrow morning,   R     02/07/18 1201   02/08/18 0500  CBC  Tomorrow morning,   R     02/07/18 1201   02/07/18 1202  Troponin I  Now then every 6 hours,   STAT     02/07/18 1201      Vitals/Pain Today's Vitals   02/07/18 1900 02/07/18 1931 02/07/18 1944 02/07/18 2010  BP: 98/68     Pulse: 80     Resp: 16     Temp:      TempSrc:      SpO2: 93%     Weight:  Height:      PainSc:  _0 Isolation Precautions No active isolations  Medications Medications  nitroGLYCERIN (NITROSTAT) SL tablet 0.4 mg (0.4 mg Sublingual Given 02/07/18 0911)  amLODipine (NORVASC) tablet 10 mg (10 mg Oral Given 02/07/18 1613)  atorvastatin (LIPITOR) tablet 80 mg (80 mg Oral Given 02/07/18 1235)  clopidogrel (PLAVIX) tablet 75 mg (75 mg Oral Given 02/07/18 1235)  lisinopril (PRINIVIL,ZESTRIL) tablet 5 mg (5 mg Oral Given 02/07/18 1235)  metoprolol succinate (TOPROL-XL) 24 hr tablet 50 mg (50 mg Oral Given 02/07/18 1234)  pantoprazole (PROTONIX) EC tablet 40 mg (40 mg Oral Given 02/07/18 1236)  ranolazine (RANEXA) 12 hr tablet 1,000 mg (1,000 mg Oral Given 02/07/18 1235)  traZODone (DESYREL) tablet 50-150 mg (has no administration in time range)  nitroGLYCERIN (NITROSTAT) SL tablet 0.4 mg (0.4 mg Sublingual Given 02/07/18 1848)  acetaminophen (TYLENOL) tablet 650 mg (has no administration in time range)  ondansetron  (ZOFRAN) injection 4 mg (has no administration in time range)  heparin injection 5,000 Units (5,000 Units Subcutaneous Given 02/07/18 1613)  aspirin EC tablet 81 mg (has no administration in time range)  morphine 4 MG/ML injection 2 mg (2 mg Intravenous Given 02/07/18 1930)  HYDROmorphone (DILAUDID) injection 0.5 mg (0.5 mg Intravenous Given 02/07/18 0944)  ondansetron (ZOFRAN) injection 4 mg (4 mg Intravenous Given 02/07/18 0944)  HYDROmorphone (DILAUDID) injection 0.5 mg (0.5 mg Intravenous Given 02/07/18 1323)    Mobility Walks

## 2018-02-07 NOTE — ED Notes (Signed)
Call to 3E to give report

## 2018-02-07 NOTE — ED Triage Notes (Signed)
Pt arrives via EMS. Complains of "heart racing" since last evening. 3am, pt vomited, continued to have chest discomfort- pain going through to back. EMS reports HR fluctuating back and forth between 110's and 70's. Pt has hx of MI, CABG. Pt took 324 mg ASA and 1 nitro. Nitro brought some relief of pain.

## 2018-02-07 NOTE — ED Notes (Signed)
Report given to 5 C

## 2018-02-07 NOTE — H&P (Addendum)
The patient has been seen in conjunction with Fabian Sharp, PA-C. All aspects of care have been considered and discussed. The patient has been personally interviewed, examined, and all clinical data has been reviewed.   The chest discomfort.  Most recent cardiac cath images reviewed in detail including stent of native right coronary February 2019.  Patient has diffuse coronary disease with bypass graft failure of the graft to RCA.  Will be observed and markers evaluated.  If elevated or EKG changes of ischemia may need to have repeat cardiac cath.  Identified a supraventricular tachycardia probably representing an ectopic atrial tachycardia that was asymptomatically occurring after admission to the emergency department.  Has complained of rapid heart beating prior to admission.  Has worn monitors in the past to exclude atrial fibrillation.  Prior CVA requiring TPA.  And is for clinical observation, trending of cardiac markers, telemetry, and further evaluation based upon clinical database.  Cardiology Admission History and Physical:   Patient ID: [name]; MRN: [id number]; DOB: 28-Nov-1955   Admission date: 02/07/2018  Primary Care Provider: [name], MD Primary Cardiologist: Larae Grooms, MD  Primary Electrophysiologist:    Chief Complaint:  Chest pain and heart racing  Patient Profile:   [name] is a 62 y.o. male with a history of CAD, STEMI s/p emergent CABG 2017 and multiple PCIs with most recent 10/05/17 (DES to RCA), symptomatic hypotension, stroke (2018), ischemic cardiomyopathy following MI in 2017 now normal EF, HTN, HLD, GERD, and depression.   History of Present Illness:   [name] has a history of CAD/STEMI and is s/p emergent CABG in 2017. He returned to [facility] 09/2017 with chest pain and had left heart cath that showed 75% stenosis of the ostial RCA and proximal RCA. RCA was treated with DES. He was discharged on DAPT for at least 12 months and likely  plavix for life. He did well since discharge. He was seen in clinic with Dr. Irish Lack 01/10/18 and was doing well without chest pain or medication changes. He admitted to sodium indiscretion and resultant lower extremity swelling.   This morning, he reported to the [facility] via EMS for chest pain and heart racing. Per EMS, HR was fluctuating between 110s to 170s.   On my interview, patient states that on Friday 02/03/18, he went to bed and experienced severe heart racing and palpitations - could hear it in his ears. He attributed this to eating [place], which always results in gastric distress. He was able to sleep and had an uneventful Sat and Sunday. On Monday night, he again tried to go to bed but experienced heart racing. He tried to sleep but woke up at 0300 and vomited. He was diaphoretic and had bouts of SOB. He was able to sleep again, but woke up 0430-0500 with heart racing. His pressure was elevated for him in the 140s/80s with a heart rate in the 110s, which is high for him. Chest pain started around 0700 and was rated as a 5-6/10. Chest pain was located centrally and radiated to his back. He reports concomitant left arm and left foot tingling. He took nitro and ASA without relief and called EMS. On arrival, he received 2 more nitro, which did not relieve his pain. Finally, dilaudid decreased his chest pain to a 2/10. He has been unaware of tachycardic rates since arrival. His last bout of heart racing was with EMS. He denies recent travel, lower extremity swelling, bleeding problems, and syncope. He has had brief bouts of  SOB and diaphoresis surrounding his one vomiting episode. He is now resting comfortably on room air. He has not missed any doses of medications.    Past Medical History:  Diagnosis Date  . Anxiety   . Basal cell carcinoma (BCC) of forehead   . CAD (coronary artery disease)    a. 10/2015 ant STEMI >> LHC with 3 v CAD; oLAD tx with POBA >> emergent CABG. b. Multiple evals since that  time, early graft failure of SVG-RCA by cath 03/2016. c. 2/19 PCI/DES x1 to pRCA, normal EF.  Marland Kitchen Depression   . Dyspnea   . Esophageal reflux    eosinophil esophagitis  . Family history of adverse reaction to anesthesia    "sister has PONV" (06/21/2017)  . Former tobacco use   . Gout   . Hepatitis C    "treated and cured" (06/21/2017)  . High cholesterol   . Hypertension   . Ischemic cardiomyopathy    a. EF 25-30% at intraop TEE 4/17  //  b. Limited Echo 5/17 - EF 45-50%, mild ant HK. c. EF 55-65% by cath 09/2017.  . Migraine    "3-4/yr" (06/21/2017)  . Myocardial infarction ([place]) 10/2015  . Sinus bradycardia    a. HR dropping into 40s in 02/2016 -> BB reduced.  . Stroke ([place]) 10/2016   "small one; sometimes my memory/cognitive issues" (06/21/2017)  . Symptomatic hypotension    a. 02/2016 ER visit -> meds reduced.    Past Surgical History:  Procedure Laterality Date  . BASAL CELL CARCINOMA EXCISION     "forehead  . CARDIAC CATHETERIZATION N/A 11/28/2015   Procedure: Left Heart Cath and Coronary Angiography;  Surgeon: [name], MD;  Location: [place] [facility];  Service: Cardiovascular;  Laterality: N/A;  . CARDIAC CATHETERIZATION N/A 11/28/2015   Procedure: Coronary Balloon Angioplasty;  Surgeon: [name], MD;  Location: [place] [facility];  Service: Cardiovascular;  Laterality: N/A;  ostial LAD  . CARDIAC CATHETERIZATION N/A 11/28/2015   Procedure: Coronary/Graft Angiography;  Surgeon: [name], MD;  Location: [place] [facility];  Service: Cardiovascular;  Laterality: N/A;  coronaries only   . CARDIAC CATHETERIZATION N/A 04/21/2016   Procedure: Left Heart Cath and Coronary Angiography;  Surgeon: Wellington Hampshire, MD;  Location: [place] [facility];  Service: Cardiovascular;  Laterality: N/A;  . CARDIAC CATHETERIZATION N/A 06/14/2016   Procedure: Left Heart Cath and Cors/Grafts Angiography;  Surgeon: Lorretta Harp, MD;  Location: [place] [facility];   Service: Cardiovascular;  Laterality: N/A;  . CARDIAC CATHETERIZATION N/A 09/08/2016   Procedure: Left Heart Cath and Cors/Grafts Angiography;  Surgeon: Wellington Hampshire, MD;  Location: [place] [facility];  Service: Cardiovascular;  Laterality: N/A;  . CARDIAC CATHETERIZATION    . CORONARY ARTERY BYPASS GRAFT N/A 11/28/2015   Procedure: CORONARY ARTERY BYPASS GRAFTING (CABG) TIMES FIVE USING LEFT INTERNAL MAMMARY ARTERY AND RIGHT GREATER SAPHENOUS,VIEN HARVEATED BY ENDOVIEN, INTRAOPPRATIVE TEE;  Surgeon: Gaye Pollack, MD;  Location: [place];  Service: Open Heart Surgery;  Laterality: N/A;  . CORONARY STENT INTERVENTION N/A 10/05/2017   Procedure: CORONARY STENT INTERVENTION;  Surgeon: [name], MD;  Location: [place] [facility];  Service: Cardiovascular;  Laterality: N/A;  . HUMERUS SURGERY Right 1969   "tumor inside bone; filled it w/bone chips"  . LEFT HEART CATH AND CORS/GRAFTS ANGIOGRAPHY N/A 03/11/2017   Procedure: Left Heart Cath and Cors/Grafts Angiography;  Surgeon: Leonie Man, MD;  Location: [facility]  INVASIVE CV LAB;  Service: Cardiovascular;  Laterality: N/A;  . LEFT HEART CATH AND CORS/GRAFTS ANGIOGRAPHY N/A 10/05/2017   Procedure: LEFT HEART CATH AND CORS/GRAFTS ANGIOGRAPHY;  Surgeon: [name], MD;  Location: [place] [facility];  Service: Cardiovascular;  Laterality: N/A;  . PERIPHERAL VASCULAR CATHETERIZATION N/A 06/14/2016   Procedure: Lower Extremity Angiography;  Surgeon: Lorretta Harp, MD;  Location: [place] [facility];  Service: Cardiovascular;  Laterality: N/A;     Medications Prior to Admission: Prior to Admission medications   Medication Sig Start Date End Date Taking? Authorizing Provider  acetaminophen (TYLENOL) 500 MG tablet Take 1,000 mg by mouth every 6 (six) hours as needed for mild pain or headache.    Yes [provider]  amLODipine (NORVASC) 10 MG tablet Take 1 tablet (10 mg total) by mouth daily. 06/22/17  Yes Strader, Fransisco Hertz, PA-C   aspirin 81 MG tablet Take 81 mg by mouth daily.   Yes [provider]  atorvastatin (LIPITOR) 80 MG tablet Take 1 tablet (80 mg total) by mouth daily. 08/16/17  Yes [name], MD  chlordiazePOXIDE (LIBRIUM) 10 MG capsule Take 10 mg by mouth 4 (four) times daily as needed for anxiety.    Yes [provider]  clopidogrel (PLAVIX) 75 MG tablet take 1 tablet by mouth once daily 01/25/17  Yes Weaver, Scott T, PA-C  fluticasone (FLONASE) 50 MCG/ACT nasal spray Place 1 spray into both nostrils as needed for allergies.  05/26/17  Yes [provider]  lisinopril (PRINIVIL,ZESTRIL) 5 MG tablet Take 5 mg by mouth daily. May take 1 additional tablet once daily for SBP greater than 150 once hour after taking daily dose Patient taking differently: Take 5 mg by mouth daily. Take 5 mg by mouth daily. May take 1 additional tablet once daily for SBP greater than 150 once hour after taking daily dose 01/18/18  Yes [name], MD  metoprolol succinate (TOPROL-XL) 25 MG 24 hr tablet Take 1 tablet (25 mg total) by mouth daily. 06/23/17  Yes Strader, [place], PA-C  Multiple Vitamins-Minerals (ONE-A-DAY MENS 50+ ADVANTAGE PO) Take 1 tablet by mouth daily.    Yes [provider]  nitroGLYCERIN (NITROSTAT) 0.4 MG SL tablet Place 1 tablet (0.4 mg total) under the tongue every 5 (five) minutes as needed for chest pain. 3 DOSE MAX 06/22/17  Yes Strader, [place] M, PA-C  pantoprazole (PROTONIX) 40 MG tablet Take 40 mg by mouth daily.   Yes [provider]  ranolazine (RANEXA) 1000 MG SR tablet Take 1,000 mg by mouth daily.   Yes [provider]  traZODone (DESYREL) 50 MG tablet Take 50-150 mg by mouth at bedtime as needed for sleep.  12/08/16  Yes [provider]     Allergies:    Allergies  Allergen Reactions  . Prednisone Other (See Comments)    States that this med makes him "crazy"  . Tetanus Toxoids Swelling and Other (See Comments)     Fever, Swelling of the arm   . Wellbutrin [Bupropion] Other (See Comments)    Crazy thoughts, nightmares  . Chantix [Varenicline] Other (See Comments)    Dreams    Social History:   Social History   Socioeconomic History  . Marital status: Married    Spouse name: [name]  . Number of children: 3  . Years of education: College  . Highest education level: Not on file  Occupational History  . Occupation: Scientist, research (physical sciences): SELF-EMPLOYED  Social Needs  . Financial resource strain: Not on file  . Food insecurity:    Worry: Not on file    Inability: Not on file  . Transportation needs:    Medical: Not on file    Non-medical: Not on file  Tobacco Use  . Smoking status: Former Smoker    Packs/day: 0.75    Years: 44.00    Pack years: 33.00    Types: Cigarettes    Last attempt to quit: 11/28/2015    Years since quitting: 2.1  . Smokeless tobacco: Never Used  Substance and Sexual Activity  . Alcohol use: No    Frequency: Never    Comment: 06/21/2017 "I'll have a few drinks q couple months"  . Drug use: No    Comment: 06/21/2017 "nothing since the 1980s"  . Sexual activity: Yes  Lifestyle  . Physical activity:    Days per week: Not on file    Minutes per session: Not on file  . Stress: Not on file  Relationships  . Social connections:    Talks on phone: Not on file    Gets together: Not on file    Attends religious service: Not on file    Active member of club or organization: Not on file    Attends meetings of clubs or organizations: Not on file    Relationship status: Not on file  . Intimate partner violence:    Fear of current or ex partner: Not on file    Emotionally abused: Not on file    Physically abused: Not on file    Forced sexual activity: Not on file  Other Topics Concern  . Not on file  Social History Narrative   Patient lives at home with his spouse.   Caffeine Use: yes    Family History:   The patient's family history includes Heart Problems  in his father; Heart attack in his maternal grandmother and paternal uncle; Heart attack (age of onset: 38) in his father; Heart failure in his father; Hypertension in his brother; Lung cancer in his mother; Stroke in his father and maternal grandmother. There is no history of Autoimmune disease.    ROS:  Please see the history of present illness.  All other ROS reviewed and negative.     Physical Exam/Data:   Vitals:   02/07/18 0742 02/07/18 0800 02/07/18 0830 02/07/18 0845  BP: 124/89 (!) 128/92 126/88 134/86  Pulse: 100 92 93 79  Resp: 18 13 17 15   Temp: 98.6 F (37 C)     TempSrc: Oral     SpO2: 98% 99% 98% 98%  Weight:      Height:       No intake or output data in the 24 hours ending 02/07/18 0957 Filed Weights   02/07/18 0737  Weight: 190 lb (86.2 kg)   Body mass index is 28.06 kg/m.  General:  Well nourished, well developed, in no acute distress HEENT: normal Neck: no JVD Vascular: No carotid bruits  Cardiac:  normal S1, S2; RRR; no murmur Lungs:  clear to auscultation bilaterally, no wheezing, rhonchi or rales  Abd: soft, nontender, no hepatomegaly  Ext: no edema Musculoskeletal:  No deformities, BUE and BLE strength normal and equal Skin: warm and dry  Neuro:  CNs 2-12 intact, no focal abnormalities noted Psych:  Normal affect    EKG:  The ECG that was done was personally reviewed and demonstrates sinus rhythm  Relevant CV Studies:  Left heart cath 10/05/17:  Ost LAD to Prox LAD lesion is 50% stenosed.  Mid LAD lesion is 55% stenosed. LIMA to LAD is patent.  SVG to diagonal is occluded. There is no significant obstruction in the native diagonal.  Ost Cx lesion is 80% stenosed.  Prox Cx to Mid Cx lesion is 50% stenosed. SVG to OM is patent.  Dist RCA lesion is 50% stenosed.  RPDA lesion is 40% stenosed.  Ost RCA to Prox RCA lesion is 75% stenosed. Origin to Prox Graft lesion is 70% stenosed.  A drug-eluting stent was successfully placed using a  STENT SYNERGY DES 4X16, postdilated to 4.5 mm.  Post intervention, there is a 0% residual stenosis.  The left ventricular systolic function is normal.  LV end diastolic pressure is normal.  The left ventricular ejection fraction is 55-65% by visual estimate.  There is no aortic valve stenosis.   Continue dual antiplatelet therapy for at least one year and, clopidogrel likely indefinitely.  Aggressive secondary prevention.     Laboratory Data:  Chemistry Recent Labs  Lab 02/07/18 0757  NA 140  K 4.3  CL 107  CO2 25  GLUCOSE 103*  BUN 18  CREATININE 1.17  CALCIUM 9.3  GFRNONAA >60  GFRAA >60  ANIONGAP 8    No results for input(s): PROT, ALBUMIN, AST, ALT, ALKPHOS, BILITOT in the last 168 hours. Hematology Recent Labs  Lab 02/07/18 0757  WBC 7.9  RBC 4.56  HGB 13.9  HCT 41.6  MCV 91.2  MCH 30.5  MCHC 33.4  RDW 12.3  PLT 205   Cardiac EnzymesNo results for input(s): TROPONINI in the last 168 hours.  Recent Labs  Lab 02/07/18 0810  TROPIPOC 0.00    BNPNo results for input(s): BNP, PROBNP in the last 168 hours.  DDimer No results for input(s): DDIMER in the last 168 hours.  Radiology/Studies:  Dg Chest 2 View  Result Date: 02/07/2018 CLINICAL DATA:  Chest pain EXAM: CHEST - 2 VIEW COMPARISON:  01/03/2018 FINDINGS: Lungs are clear.  No pleural effusion or pneumothorax. The heart is normal in size. Postsurgical changes related to prior CABG. Degenerative changes of the visualized thoracolumbar spine. Median sternotomy. IMPRESSION: Normal chest radiographs. Electronically Signed   By: Julian Hy M.D.   On: 02/07/2018 08:37    Assessment and Plan:   1. Chest pain, known CAD, last cath 09/2017 with DES to RCA (troponin were negative) - initial troponin was negative, but chest pain just started around 0700 - continue to trend troponin - heparin drip if positive troponin - EKG with sinus rhythm and no evidence of acute ischemia - he has residual  disease: 70% stenosis in the origin to Prox Graft lesion    2. Heart racing - telemetry appears to be ectopic atrial tachycardia, different p wave morphologies - rates from 90-150s - pt has been unaware of tachycardic rates since arrival in the ED - continue telemetry and observe for arrhythmias - will increase toprol to 50 mg to suppress this tachycardia - will also draw a d-dimer, low suspicion for PE   3. HTN - pressure was elevated at home prior to arrival - pressures have been well-controlled, likely secondary to nitro - continue home meds: norvasc, lisinopril, toprol   4. Ischemic cardiomyopathy,  Mildly reduced EF to 45-50% following MI 2017 prior to CABG - echo 11/09/16 with normal LVEF and grad 1 DD - pt does not appear volume overloaded on exam and has done well on current medication regimen   5.  HLD - 06/22/2017: VLDL 27 11/08/2017: Cholesterol, Total 114; HDL 52; LDL Calculated 43; Triglycerides 93 - pt on lipitor, vascepa not started   Plan to admit to cardiology and cycle troponins. Will increase beta blocker to address ectopic atrial tachycardia.     Severity of Illness: The appropriate patient status for this patient is INPATIENT. Inpatient status is judged to be reasonable and necessary in order to provide the required intensity of service to ensure the patient's safety. The patient's presenting symptoms, physical exam findings, and initial radiographic and laboratory data in the context of their chronic comorbidities is felt to place them at high risk for further clinical deterioration. Furthermore, it is not anticipated that the patient will be medically stable for discharge from the hospital within 2 midnights of admission. The following factors support the patient status of inpatient.   " The patient's presenting symptoms include chest pain. " The worrisome physical exam findings include . " The initial radiographic and laboratory data are worrisome because of  . " The chronic co-morbidities include CAD.   * I certify that at the point of admission it is my clinical judgment that the patient will require inpatient hospital care spanning beyond 2 midnights from the point of admission due to high intensity of service, high risk for further deterioration and high frequency of surveillance required.*    For questions or updates, please contact [place] Please consult [email or web address] for contact info under Cardiology/STEMI.    Signed, Tami Lin Duke, PA  02/07/2018 9:57 AM

## 2018-02-07 NOTE — ED Notes (Signed)
Ordered heart healthy meal tray for pt. 

## 2018-02-07 NOTE — ED Notes (Signed)
Pt reports CP is increased to 5/10 at this time.  No sob.  Pt given SL nitro x1 at this time.

## 2018-02-08 DIAGNOSIS — I10 Essential (primary) hypertension: Secondary | ICD-10-CM

## 2018-02-08 LAB — BASIC METABOLIC PANEL
Anion gap: 7 (ref 5–15)
BUN: 14 mg/dL (ref 6–20)
CO2: 29 mmol/L (ref 22–32)
Calcium: 9.3 mg/dL (ref 8.9–10.3)
Chloride: 105 mmol/L (ref 101–111)
Creatinine, Ser: 1.12 mg/dL (ref 0.61–1.24)
GFR calc Af Amer: >60 mL/min (ref >60)
GFR calc non Af Amer: >60 mL/min (ref >60)
Glucose, Bld: 96 mg/dL (ref 65–99)
Potassium: 4.2 mmol/L (ref 3.5–5.1)
Sodium: 141 mmol/L (ref 135–145)

## 2018-02-08 LAB — CBC
HCT: 42.4 % (ref 39.0–52.0)
Hemoglobin: 14.1 g/dL (ref 13.0–17.0)
MCH: 30.5 pg (ref 26.0–34.0)
MCHC: 33.3 g/dL (ref 30.0–36.0)
MCV: 91.6 fL (ref 78.0–100.0)
Platelets: 217 10*3/uL (ref 150–400)
RBC: 4.63 MIL/uL (ref 4.22–5.81)
RDW: 12.3 % (ref 11.5–15.5)
WBC: 8.4 10*3/uL (ref 4.0–10.5)

## 2018-02-08 LAB — TROPONIN I: Troponin I: <0.03 ng/mL (ref <0.03)

## 2018-02-08 MED ORDER — HYDROMORPHONE HCL 1 MG/ML IJ SOLN: 1.0000 mg | INTRAMUSCULAR | Status: DC | PRN

## 2018-02-08 MED ORDER — METOPROLOL SUCCINATE ER 25 MG PO TB24
25.0000 mg | ORAL_TABLET | Freq: Once | ORAL | Status: AC
  Administered 2018-02-08: 25 mg via ORAL
  Filled 2018-02-08: qty 1

## 2018-02-08 MED ORDER — ISOSORBIDE MONONITRATE ER 30 MG PO TB24: 30.0000 mg | ORAL_TABLET | Freq: Every day | ORAL | Status: DC

## 2018-02-08 MED ORDER — METOPROLOL SUCCINATE ER 50 MG PO TB24
75.0000 mg | ORAL_TABLET | Freq: Every day | ORAL | Status: DC
  Administered 2018-02-09: 75 mg via ORAL
  Filled 2018-02-08: qty 1

## 2018-02-08 MED ORDER — HYDROMORPHONE HCL 1 MG/ML IJ SOLN
1.0000 mg | INTRAMUSCULAR | Status: DC | PRN
  Administered 2018-02-08 – 2018-02-09 (×7): 1 mg via INTRAVENOUS
  Filled 2018-02-08 (×7): qty 1

## 2018-02-08 MED ORDER — CHLORDIAZEPOXIDE HCL 5 MG PO CAPS
10.0000 mg | ORAL_CAPSULE | Freq: Four times a day (QID) | ORAL | Status: DC | PRN
  Administered 2018-02-08 – 2018-02-09 (×2): 10 mg via ORAL
  Filled 2018-02-08 (×2): qty 2

## 2018-02-08 NOTE — Progress Notes (Addendum)
The patient has been seen in conjunction with Fabian Sharp, PA-C. All aspects of care have been considered and discussed. The patient has been personally interviewed, examined, and all clinical data has been reviewed.   Intermittent atypical chest pain, spontaneously occurring.  Negative markers and no change in EKG over time.  No evidence of infarction.  Not certain if this is ischemic pain.  Episodes of supraventricular tachycardia, likely an ectopic atrial tachycardia.  Uptitrate beta-blocker therapy.  Plan to ambulate.  Cath versus conservative medical therapy will be dependent upon symptoms with exertion and subsequent clinical course over the next 12 hours.  I have discontinued isosorbide mononitrate   Progress Note  Patient Name: [name] Date of Encounter: 02/08/2018  Primary Cardiologist: Larae Grooms, MD   Subjective   Pt continues to have intermittent chest pain requiring narcotics. Pt has tachycardia with any activity.   Inpatient Medications    Scheduled Meds: . amLODipine  10 mg Oral Daily  . aspirin EC  81 mg Oral Daily  . atorvastatin  80 mg Oral Daily  . clopidogrel  75 mg Oral Daily  . heparin  5,000 Units Subcutaneous Q8H  . lisinopril  5 mg Oral Daily  . metoprolol succinate  50 mg Oral Daily  . pantoprazole  40 mg Oral Daily  . ranolazine  1,000 mg Oral Daily   Continuous Infusions:  PRN Meds: acetaminophen, chlordiazePOXIDE, morphine injection, nitroGLYCERIN, ondansetron (ZOFRAN) IV, traZODone   Vital Signs    Vitals:   02/07/18 2000 02/07/18 2057 02/08/18 0420 02/08/18 0500  BP: 113/83 123/81 109/74   Pulse: 72 67 76   Resp: (!) 9 17 15    Temp:  98.2 F (36.8 C) 97.6 F (36.4 C)   TempSrc:  Oral Oral   SpO2: 99% 97% 95%   Weight:  193 lb 9.6 oz (87.8 kg)  193 lb 9 oz (87.8 kg)  Height:  5\' 8"  (1.727 m)      Intake/Output Summary (Last 24 hours) at 02/08/2018 1030 Last data filed at 02/08/2018 0600 Gross per 24 hour    Intake 360 ml  Output -  Net 360 ml   Filed Weights   02/07/18 0737 02/07/18 2057 02/08/18 0500  Weight: 190 lb (86.2 kg) 193 lb 9.6 oz (87.8 kg) 193 lb 9 oz (87.8 kg)    Telemetry    Ectopic atrial tachycardia bouts 130-140s - Personally Reviewed  ECG    No new tracings - Personally Reviewed  Physical Exam   GEN: No acute distress.   Neck: No JVD Cardiac: RRR, no murmurs, rubs, or gallops.  Respiratory: Clear to auscultation bilaterally. GI: Soft, nontender, non-distended  MS: No edema; No deformity. Neuro:  Nonfocal  Psych: Normal affect   Labs    Chemistry Recent Labs  Lab 02/07/18 0757 02/07/18 1246 02/08/18 0000  NA 140  --  141  K 4.3  --  4.2  CL 107  --  105  CO2 25  --  29  GLUCOSE 103*  --  96  BUN 18  --  14  CREATININE 1.17 1.08 1.12  CALCIUM 9.3  --  9.3  GFRNONAA >60 >60 >60  GFRAA >60 >60 >60  ANIONGAP 8  --  7     Hematology Recent Labs  Lab 02/07/18 0757 02/07/18 1246 02/08/18 0000  WBC 7.9 8.5 8.4  RBC 4.56 4.63 4.63  HGB 13.9 13.9 14.1  HCT 41.6 42.3 42.4  MCV 91.2 91.4 91.6  MCH 30.5  30.0 30.5  MCHC 33.4 32.9 33.3  RDW 12.3 12.2 12.3  PLT 205 222 217    Cardiac Enzymes Recent Labs  Lab 02/07/18 1246 02/07/18 1905 02/07/18 2346  TROPONINI <0.03 <0.03 <0.03    Recent Labs  Lab 02/07/18 0810  TROPIPOC 0.00     BNP Recent Labs  Lab 02/07/18 1246  BNP 28.0     DDimer  Recent Labs  Lab 02/07/18 1246  DDIMER <0.27     Radiology    Dg Chest 2 View  Result Date: 02/07/2018 CLINICAL DATA:  Chest pain EXAM: CHEST - 2 VIEW COMPARISON:  01/03/2018 FINDINGS: Lungs are clear.  No pleural effusion or pneumothorax. The heart is normal in size. Postsurgical changes related to prior CABG. Degenerative changes of the visualized thoracolumbar spine. Median sternotomy. IMPRESSION: Normal chest radiographs. Electronically Signed   By: Julian Hy M.D.   On: 02/07/2018 08:37    Cardiac Studies   Left heart cath  10/05/17:  Ost LAD to Prox LAD lesion is 50% stenosed.  Mid LAD lesion is 55% stenosed. LIMA to LAD is patent.  SVG to diagonal is occluded. There is no significant obstruction in the native diagonal.  Ost Cx lesion is 80% stenosed.  Prox Cx to Mid Cx lesion is 50% stenosed. SVG to OM is patent.  Dist RCA lesion is 50% stenosed.  RPDA lesion is 40% stenosed.  Ost RCA to Prox RCA lesion is 75% stenosed. Origin to Prox Graft lesion is 70% stenosed.  A drug-eluting stent was successfully placed using a STENT SYNERGY DES 4X16, postdilated to 4.5 mm.  Post intervention, there is a 0% residual stenosis.  The left ventricular systolic function is normal.  LV end diastolic pressure is normal.  The left ventricular ejection fraction is 55-65% by visual estimate.  There is no aortic valve stenosis.  Continue dual antiplatelet therapy for at least one year and, clopidogrel likely indefinitely.  Aggressive secondary prevention.    Patient Profile     62 y.o. male with a history of CAD, STEMI s/p emergent CABG 2017 and multiple PCIs with most recent 10/05/17 (DES to RCA), symptomatic hypotension, stroke (2018), ischemic cardiomyopathy following MI in 2017 now normal EF, HTN, HLD, GERD, and depression.   Assessment & Plan    1. Chest pain, known CAD, last cath 09/2017 with dES to RCA (trops negative), residual stenosis in a proximal graft - troponin x 4 remained negative overnight - no new tracings to review - pt continues to complain of chest pain, asking for more morphine vs dilaudid - I will start a low dose of imdur - he is already on ranexa and norvasc - will discuss possible repeat cath with Dr. Tamala Julian   2. Rapid heart rate, palpitations, ectopic atrial tachycardia - toprol was increased yesterday to 50 mg - telemetry with continued bouts of ectopic atrial tachycardia, increased heart rate with activity - will increase beta blocker to 75 mg daily - may need to consider EP  consult  3. HTN - pressures have been well-controlled on norvasc, lisinopril, and toprol - no medication changes  4. Ischemic cardiomyopathy, now with normal EF - euvolemic, no breathing issues   For questions or updates, please contact [place] Please consult [email or web address] for contact info under Cardiology/STEMI.      Signed, Tami Lin Duke, PA  02/08/2018, 10:30 AM

## 2018-02-09 ENCOUNTER — Inpatient Hospital Stay (HOSPITAL_COMMUNITY): Payer: BC Managed Care – PPO

## 2018-02-09 ENCOUNTER — Encounter (HOSPITAL_COMMUNITY): Payer: Self-pay | Admitting: Physician Assistant

## 2018-02-09 DIAGNOSIS — E785 Hyperlipidemia, unspecified: Secondary | ICD-10-CM

## 2018-02-09 DIAGNOSIS — I471 Supraventricular tachycardia: Secondary | ICD-10-CM

## 2018-02-09 DIAGNOSIS — I25110 Atherosclerotic heart disease of native coronary artery with unstable angina pectoris: Secondary | ICD-10-CM

## 2018-02-09 DIAGNOSIS — Z951 Presence of aortocoronary bypass graft: Secondary | ICD-10-CM

## 2018-02-09 DIAGNOSIS — R0782 Intercostal pain: Secondary | ICD-10-CM

## 2018-02-09 MED ORDER — METOPROLOL SUCCINATE ER 25 MG PO TB24: 75.0000 mg | ORAL_TABLET | Freq: Every day | ORAL | 11 refills | Status: DC

## 2018-02-09 MED ORDER — IOPAMIDOL (ISOVUE-370) INJECTION 76%
100.0000 mL | Freq: Once | INTRAVENOUS | Status: AC | PRN
  Administered 2018-02-09: 100 mL via INTRAVENOUS

## 2018-02-09 MED ORDER — IOPAMIDOL (ISOVUE-370) INJECTION 76%
INTRAVENOUS | Status: AC
  Filled 2018-02-09: qty 100

## 2018-02-09 NOTE — Discharge Summary (Addendum)
The patient has been seen in conjunction with Fabian Sharp, PA-C. All aspects of care have been considered and discussed. The patient has been personally interviewed, examined, and all clinical data has been reviewed.   Myocardial infarction/unstable angina ruled out.  Acute aortic syndrome excluded.  Thoracic and lumbar degenerative disc disease likely causing referred pain in the chest.  Tylenol therapy for pain control.  Follow-up with primary care and Dr. Irish Lack as outlined below.  Discharge Summary    Patient ID: [name],  MRN: [id number], DOB/AGE: 01-13-1956 62 y.o.  Admit date: 02/07/2018 Discharge date: 02/09/2018  Primary Care Provider: [name] Primary Cardiologist: Larae Grooms, MD  Discharge Diagnoses    Active Problems:   Coronary artery disease involving native coronary artery of native heart with unstable angina pectoris (HCC)   S/P CABG x 5   Hyperlipidemia LDL goal <70   Essential hypertension   Tobacco abuse disorder   Unstable angina (HCC)   Chest pain   Ectopic atrial tachycardia (HCC)   Allergies Allergies  Allergen Reactions  . Prednisone Other (See Comments)    States that this med makes him "crazy"  . Tetanus Toxoids Swelling and Other (See Comments)    Fever, Swelling of the arm   . Wellbutrin [Bupropion] Other (See Comments)    Crazy thoughts, nightmares  . Chantix [Varenicline] Other (See Comments)    Dreams    Diagnostic Studies/Procedures    CT angio chest 6/113/19: IMPRESSION: 1. No thoracic aortic aneurysm or dissection. No aortic ulceration evident on this study. There is aortic atherosclerosis. Patient is status post coronary artery bypass grafting.  2.  No demonstrable pulmonary embolus.  3. No lung edema or consolidation. There is a 2 mm nodular opacity in the anterior segment left upper lobe. No follow-up needed if patient is low-risk. Non-contrast chest CT can be considered in 12 months if patient  is high-risk. This recommendation follows the consensus statement: Guidelines for Management of Incidental Pulmonary Nodules Detected on CT Images: From the Fleischner Society 2017; Radiology 2017; 284:228-243.  4.  No demonstrable thoracic adenopathy.    History of Present Illness     [name] has a history of CAD/STEMI and is s/p emergent CABG in 2017. He returned to [facility] 09/2017 with chest pain and had left heart cath that showed 75% stenosis of the ostial RCA and proximal RCA. RCA was treated with DES. He was discharged on DAPT for at least 12 months and likely plavix for life. He did well since discharge. He was seen in clinic with Dr. Irish Lack 01/10/18 and was doing well without chest pain or medication changes. He admitted to sodium indiscretion and resultant lower extremity swelling.   This morning, he reported to the [facility] via EMS for chest pain and heart racing. Per EMS, HR was fluctuating between 110s to 170s.   On my interview, patient states that on Friday 02/03/18, he went to bed and experienced severe heart racing and palpitations - could hear it in his ears. He attributed this to eating [place], which always results in gastric distress. He was able to sleep and had an uneventful Sat and Sunday. On Monday night, he again tried to go to bed but experienced heart racing. He tried to sleep but woke up at 0300 and vomited. He was diaphoretic and had bouts of SOB. He was able to sleep again, but woke up 0430-0500 with heart racing. His pressure was elevated for him in the 140s/80s with a  heart rate in the 110s, which is high for him. Chest pain started around 0700 and was rated as a 5-6/10. Chest pain was located centrally and radiated to his back. He reports concomitant left arm and left foot tingling. He took nitro and ASA without relief and called EMS. On arrival, he received 2 more nitro, which did not relieve his pain. Finally, dilaudid decreased his chest pain to a 2/10. He has been  unaware of tachycardic rates since arrival. His last bout of heart racing was with EMS. He denies recent travel, lower extremity swelling, bleeding problems, and syncope. He has had brief bouts of SOB and diaphoresis surrounding his one vomiting episode. He is now resting comfortably on room air. He has not missed any doses of medications.   Hospital Course     Consultants: none  Chest pain, rapid heart rate, palpitations Pt has known CAD with last cath 09/2017 with DES to RCA (troponins were negative) He was admitted to cardiology. Troponins remained negative. EKG was without signs of acute ischemia, but did who evidence of ectopic atrial tachycardia. Home beta blocker was titrated up for ectopic atrial tachycardia. Pt continued to have ongoing chest and back pain relieved with narcotics, but not nitro.  Low suspicion for PE, D-dimer was negative. Per Dr. Tamala Julian, MI was excluded given negative markers and EKG without acute ischemia. However, patient continued to complain of chest pain radiating to his back. CT angiogram was completed to rule out aortic syndrome. CT angio was negative for aortic syndrome. He was discharged on tylenol.   Ectopic atrial tachycardia Improved with up-titration of beta blocker.   HTN Pressures were well-controlled. Continue norvasc, lisinopril, and toprol.   Ischemic cardiomyopathy, mildly reduced EF to 45-50% following MI 2017 prior to CABG. Repeat echo 11/09/16 with normal LVEF and grade 1 DD. Pt was euvolemic during hospitalization.  HLD with LDL goal of less than 70 06/22/2017: VLDL 27 11/08/2017: Cholesterol, Total 114; HDL 52; LDL Calculated 43; Triglycerides 93 Continue lipitor. Pt did not start vascepa.    _____________  Discharge Vitals Blood pressure (!) 142/84, pulse 81, temperature 98.5 F (36.9 C), temperature source Oral, resp. rate 15, height 5\' 8"  (1.727 m), weight 192 lb 6.4 oz (87.3 kg), SpO2 96 %.  Filed Weights   02/07/18 2057 02/08/18 0500  02/09/18 0419  Weight: 193 lb 9.6 oz (87.8 kg) 193 lb 9 oz (87.8 kg) 192 lb 6.4 oz (87.3 kg)    Labs & Radiologic Studies    CBC Recent Labs    02/07/18 1246 02/08/18 0000  WBC 8.5 8.4  HGB 13.9 14.1  HCT 42.3 42.4  MCV 91.4 91.6  PLT 222 154   Basic Metabolic Panel Recent Labs    02/07/18 0757 02/07/18 1246 02/08/18 0000  NA 140  --  141  K 4.3  --  4.2  CL 107  --  105  CO2 25  --  29  GLUCOSE 103*  --  96  BUN 18  --  14  CREATININE 1.17 1.08 1.12  CALCIUM 9.3  --  9.3  MG  --  2.1  --    Liver Function Tests No results for input(s): AST, ALT, ALKPHOS, BILITOT, PROT, ALBUMIN in the last 72 hours. No results for input(s): LIPASE, AMYLASE in the last 72 hours. Cardiac Enzymes Recent Labs    02/07/18 1246 02/07/18 1905 02/07/18 2346  TROPONINI <0.03 <0.03 <0.03   BNP Invalid input(s): POCBNP D-Dimer Recent Labs  02/07/18 1246  DDIMER <0.27   Hemoglobin A1C No results for input(s): HGBA1C in the last 72 hours. Fasting Lipid Panel No results for input(s): CHOL, HDL, LDLCALC, TRIG, CHOLHDL, LDLDIRECT in the last 72 hours. Thyroid Function Tests Recent Labs    02/07/18 1246  TSH 1.319   _____________  Dg Chest 2 View  Result Date: 02/07/2018 CLINICAL DATA:  Chest pain EXAM: CHEST - 2 VIEW COMPARISON:  01/03/2018 FINDINGS: Lungs are clear.  No pleural effusion or pneumothorax. The heart is normal in size. Postsurgical changes related to prior CABG. Degenerative changes of the visualized thoracolumbar spine. Median sternotomy. IMPRESSION: Normal chest radiographs. Electronically Signed   By: Julian Hy M.D.   On: 02/07/2018 08:37   Ct Angio Chest Aorta W/cm &/or Wo/cm  Result Date: 02/09/2018 CLINICAL DATA:  Chest pain EXAM: CT ANGIOGRAPHY CHEST WITH CONTRAST TECHNIQUE: Multidetector CT imaging of the chest was performed using the standard protocol during bolus administration of intravenous contrast. Multiplanar CT image reconstructions and MIPs  were obtained to evaluate the vascular anatomy. CONTRAST:  151mL ISOVUE-370 IOPAMIDOL (ISOVUE-370) INJECTION 76% COMPARISON:  Chest radiograph February 07, 2018 FINDINGS: Cardiovascular: There is no thoracic aortic aneurysm or dissection. Visualized great vessels appear normal except for modest calcification at the origins of the right innominate and left subclavian arteries. There are foci of aortic atherosclerosis. No thoracic aortic ulceration is evident. Patient is status post coronary artery bypass grafting. There is no pericardial effusion or pericardial thickening evident. There is no demonstrable pulmonary embolus. Mediastinum/Nodes: Visualized thyroid appears normal. There is no appreciable thoracic adenopathy. No esophageal lesions are evident. Lungs/Pleura: There is mild bibasilar atelectatic change. There is no lung edema or consolidation. On axial slice 52 series 6, there is a 2 mm nodular opacity in the anterior segment left upper lobe. There is no pleural effusion or pleural thickening evident. No evident pneumothorax. Upper Abdomen: No abnormality noted except for slight aortic atherosclerosis. Musculoskeletal: There is degenerative change in the thoracic spine. Patient is status post median sternotomy. No blastic or lytic bone lesions. No evident chest wall lesions. Review of the MIP images confirms the above findings. IMPRESSION: 1. No thoracic aortic aneurysm or dissection. No aortic ulceration evident on this study. There is aortic atherosclerosis. Patient is status post coronary artery bypass grafting. 2.  No demonstrable pulmonary embolus. 3. No lung edema or consolidation. There is a 2 mm nodular opacity in the anterior segment left upper lobe. No follow-up needed if patient is low-risk. Non-contrast chest CT can be considered in 12 months if patient is high-risk. This recommendation follows the consensus statement: Guidelines for Management of Incidental Pulmonary Nodules Detected on CT Images:  From the Fleischner Society 2017; Radiology 2017; 284:228-243. 4.  No demonstrable thoracic adenopathy. Aortic Atherosclerosis (ICD10-I70.0). Electronically Signed   By: Lowella Grip III M.D.   On: 02/09/2018 10:39   Disposition   Pt is being discharged home today in good condition.  Follow-up Plans & Appointments    Follow-up Information    Liliane Shi, PA-C Follow up on 02/24/2018.   Specialties:  Cardiology, Physician Assistant Why:  11:15 am for hospital follow up Contact information: [address] [address] [phone]          Discharge Instructions    Diet - low sodium heart healthy   Complete by:  As directed    Increase activity slowly   Complete by:  As directed       Discharge  Medications   Allergies as of 02/09/2018      Reactions   Prednisone Other (See Comments)   States that this med makes him "crazy"   Tetanus Toxoids Swelling, Other (See Comments)   Fever, Swelling of the arm    Wellbutrin [bupropion] Other (See Comments)   Crazy thoughts, nightmares   Chantix [varenicline] Other (See Comments)   Dreams      Medication List    TAKE these medications   acetaminophen 500 MG tablet Commonly known as:  TYLENOL Take 1,000 mg by mouth every 6 (six) hours as needed for mild pain or headache.   amLODipine 10 MG tablet Commonly known as:  NORVASC Take 1 tablet (10 mg total) by mouth daily.   aspirin 81 MG tablet Take 81 mg by mouth daily.   atorvastatin 80 MG tablet Commonly known as:  LIPITOR Take 1 tablet (80 mg total) by mouth daily.   chlordiazePOXIDE 10 MG capsule Commonly known as:  LIBRIUM Take 10 mg by mouth 4 (four) times daily as needed for anxiety.   clopidogrel 75 MG tablet Commonly known as:  PLAVIX take 1 tablet by mouth once daily   fluticasone 50 MCG/ACT nasal spray Commonly known as:  FLONASE Place 1 spray into both nostrils as needed for allergies.   lisinopril 5 MG  tablet Commonly known as:  PRINIVIL,ZESTRIL Take 5 mg by mouth daily. May take 1 additional tablet once daily for SBP greater than 150 once hour after taking daily dose What changed:    how much to take  how to take this  when to take this  additional instructions   metoprolol succinate 25 MG 24 hr tablet Commonly known as:  TOPROL-XL Take 3 tablets (75 mg total) by mouth daily. What changed:  how much to take   nitroGLYCERIN 0.4 MG SL tablet Commonly known as:  NITROSTAT Place 1 tablet (0.4 mg total) under the tongue every 5 (five) minutes as needed for chest pain. 3 DOSE MAX   ONE-A-DAY MENS 50+ ADVANTAGE PO Take 1 tablet by mouth daily.   pantoprazole 40 MG tablet Commonly known as:  PROTONIX Take 40 mg by mouth daily.   ranolazine 1000 MG SR tablet Commonly known as:  RANEXA Take 1,000 mg by mouth daily.   traZODone 50 MG tablet Commonly known as:  DESYREL Take 50-150 mg by mouth at bedtime as needed for sleep.        Acute coronary syndrome (MI, NSTEMI, STEMI, etc) this admission?: No.    Outstanding Labs/Studies   none  Duration of Discharge Encounter   Greater than 30 minutes including physician time.  Signed, [place], [place] 02/09/2018, 1:07 PM

## 2018-02-09 NOTE — Progress Notes (Signed)
Called CT notified them that patient has IV in RAC 20g and ready for scan. Also notified them that PA states discharge pending CT scan.

## 2018-02-09 NOTE — Progress Notes (Signed)
   Reviewed CT angios of aorta.  No evidence of acute aortic syndrome.  Again identified is significant degenerative thoracic spine disease.  Suspect chest discomfort is related to thoracic spine disease with referred pain.  Symptomatic therapy with Tylenol.  We will plan to discharge the patient today.

## 2018-02-09 NOTE — Progress Notes (Signed)
Progress Note  Patient Name: [name] Date of Encounter: 02/09/2018  Primary Cardiologist: Larae Grooms, MD   Subjective   Complaining of left subxiphoid discomfort radiating into the back and around the left costal margin.  Discomfort is ongoing and there is some tenderness at the left lateral costal margin.  Discomfort is not exertional related.  No dyspnea or other complaints.  EKG and cardiac markers have been negative.  Inpatient Medications    Scheduled Meds: . amLODipine  10 mg Oral Daily  . aspirin EC  81 mg Oral Daily  . atorvastatin  80 mg Oral Daily  . clopidogrel  75 mg Oral Daily  . heparin  5,000 Units Subcutaneous Q8H  . lisinopril  5 mg Oral Daily  . metoprolol succinate  75 mg Oral Daily  . pantoprazole  40 mg Oral Daily  . ranolazine  1,000 mg Oral Daily   Continuous Infusions:  PRN Meds: acetaminophen, chlordiazePOXIDE, HYDROmorphone (DILAUDID) injection, nitroGLYCERIN, ondansetron (ZOFRAN) IV, traZODone   Vital Signs    Vitals:   02/09/18 0417 02/09/18 0418 02/09/18 0419 02/09/18 0645  BP: 126/90 (!) 141/98 107/74   Pulse: 80     Resp: (!) 9 17 10 15   Temp:      TempSrc:      SpO2: 99%     Weight:   192 lb 6.4 oz (87.3 kg)   Height:        Intake/Output Summary (Last 24 hours) at 02/09/2018 0826 Last data filed at 02/09/2018 0600 Gross per 24 hour  Intake 480 ml  Output -  Net 480 ml   Filed Weights   02/07/18 2057 02/08/18 0500 02/09/18 0419  Weight: 193 lb 9.6 oz (87.8 kg) 193 lb 9 oz (87.8 kg) 192 lb 6.4 oz (87.3 kg)    Telemetry    Normal sinus rhythm with occasional ectopic atrial tachycardia into the 120 bpm range.- Personally Reviewed  ECG    Normal sinus rhythm with normal appearance despite ongoing continuous discomfort.- Personally Reviewed  Physical Exam  Comfortable and answering emails on his phone despite complaints of chest discomfort. GEN: No acute distress.   Neck: No JVD Cardiac: RRR, no murmurs,  rubs, or gallops.  Respiratory: Clear to auscultation bilaterally. GI: Soft, nontender, non-distended  MS: No edema; No deformity.  Left lower parasternal tenderness.  The tenderness is of different quality than the chest discomfort. Neuro:  Nonfocal  Psych: Normal affect   Labs    Chemistry Recent Labs  Lab 02/07/18 0757 02/07/18 1246 02/08/18 0000  NA 140  --  141  K 4.3  --  4.2  CL 107  --  105  CO2 25  --  29  GLUCOSE 103*  --  96  BUN 18  --  14  CREATININE 1.17 1.08 1.12  CALCIUM 9.3  --  9.3  GFRNONAA >60 >60 >60  GFRAA >60 >60 >60  ANIONGAP 8  --  7     Hematology Recent Labs  Lab 02/07/18 0757 02/07/18 1246 02/08/18 0000  WBC 7.9 8.5 8.4  RBC 4.56 4.63 4.63  HGB 13.9 13.9 14.1  HCT 41.6 42.3 42.4  MCV 91.2 91.4 91.6  MCH 30.5 30.0 30.5  MCHC 33.4 32.9 33.3  RDW 12.3 12.2 12.3  PLT 205 222 217    Cardiac Enzymes Recent Labs  Lab 02/07/18 1246 02/07/18 1905 02/07/18 2346  TROPONINI <0.03 <0.03 <0.03    Recent Labs  Lab 02/07/18 0810  TROPIPOC 0.00  BNP Recent Labs  Lab 02/07/18 1246  BNP 28.0     DDimer  Recent Labs  Lab 02/07/18 1246  DDIMER <0.27     Radiology    No results found.  Cardiac Studies   No cardiac studies at this admission.  Patient Profile     62 y.o. male male with a history of CAD, STEMI s/p emergent CABG 2017 and multiple PCIs with most recent 10/05/17 (DES to RCA), symptomatic hypotension, stroke (2018), ischemic cardiomyopathy following MI in 2017 now normal EF, HTN, HLD, GERD, and depression  Review of chest x-ray reveals significant thoracolumbar degenerative changes.  Assessment & Plan    1. Chest discomfort radiating to back and also around the left costal margin.  Myocardial infarction has been excluded.  EKGs have been stable.  Chest discomfort is likely related to degenerative disc disease with referred pain.  Given the association with back discomfort and aortic CT angiogram will be performed  to rule out acute aortic syndrome.  If this is negative the patient will be discharged on Tylenol and will follow-up with both primary and Dr. Irish Lack within the coming month. 2. CAD with prior bypass surgery and graft failure requiring right coronary stent.  No evidence of active. 3. Ectopic atrial tachycardia, nonsustained, improved with up titration of beta-blocker therapy. 4. Hypertension, controlled.  For questions or updates, please contact [place] Please consult [email or web address] for contact info under Cardiology/STEMI.      Signed, Sinclair Grooms, MD  02/09/2018, 8:26 AM

## 2018-02-09 NOTE — Progress Notes (Signed)
@  0350 this AM pt woke with same recurring chest pain now at a 6/10. PRN Dilaudid administered. Pt anxious but not diaphoretic. VSS and WDL. AM EKG done early and indicated NSR with no ectopy or evidence of ischemic changes. Orthostatic VS taken secondary to pt endorsing dizziness while ambulating earlier in the night. Lying pt was 126/90, sitting 141/98, and standing 107/74. 30 minutes post Dilaudid administration, pt was pain free. Will convey above event to day RN to convey to pt's Attending.

## 2018-02-16 IMAGING — DX DG CHEST 2V
2 series · 2 of 2 positions shown · non-contrast
Comparison: 03/01/2016

CLINICAL DATA: Chest pain, nausea

EXAM:
CHEST  2 VIEW

[chest pa]
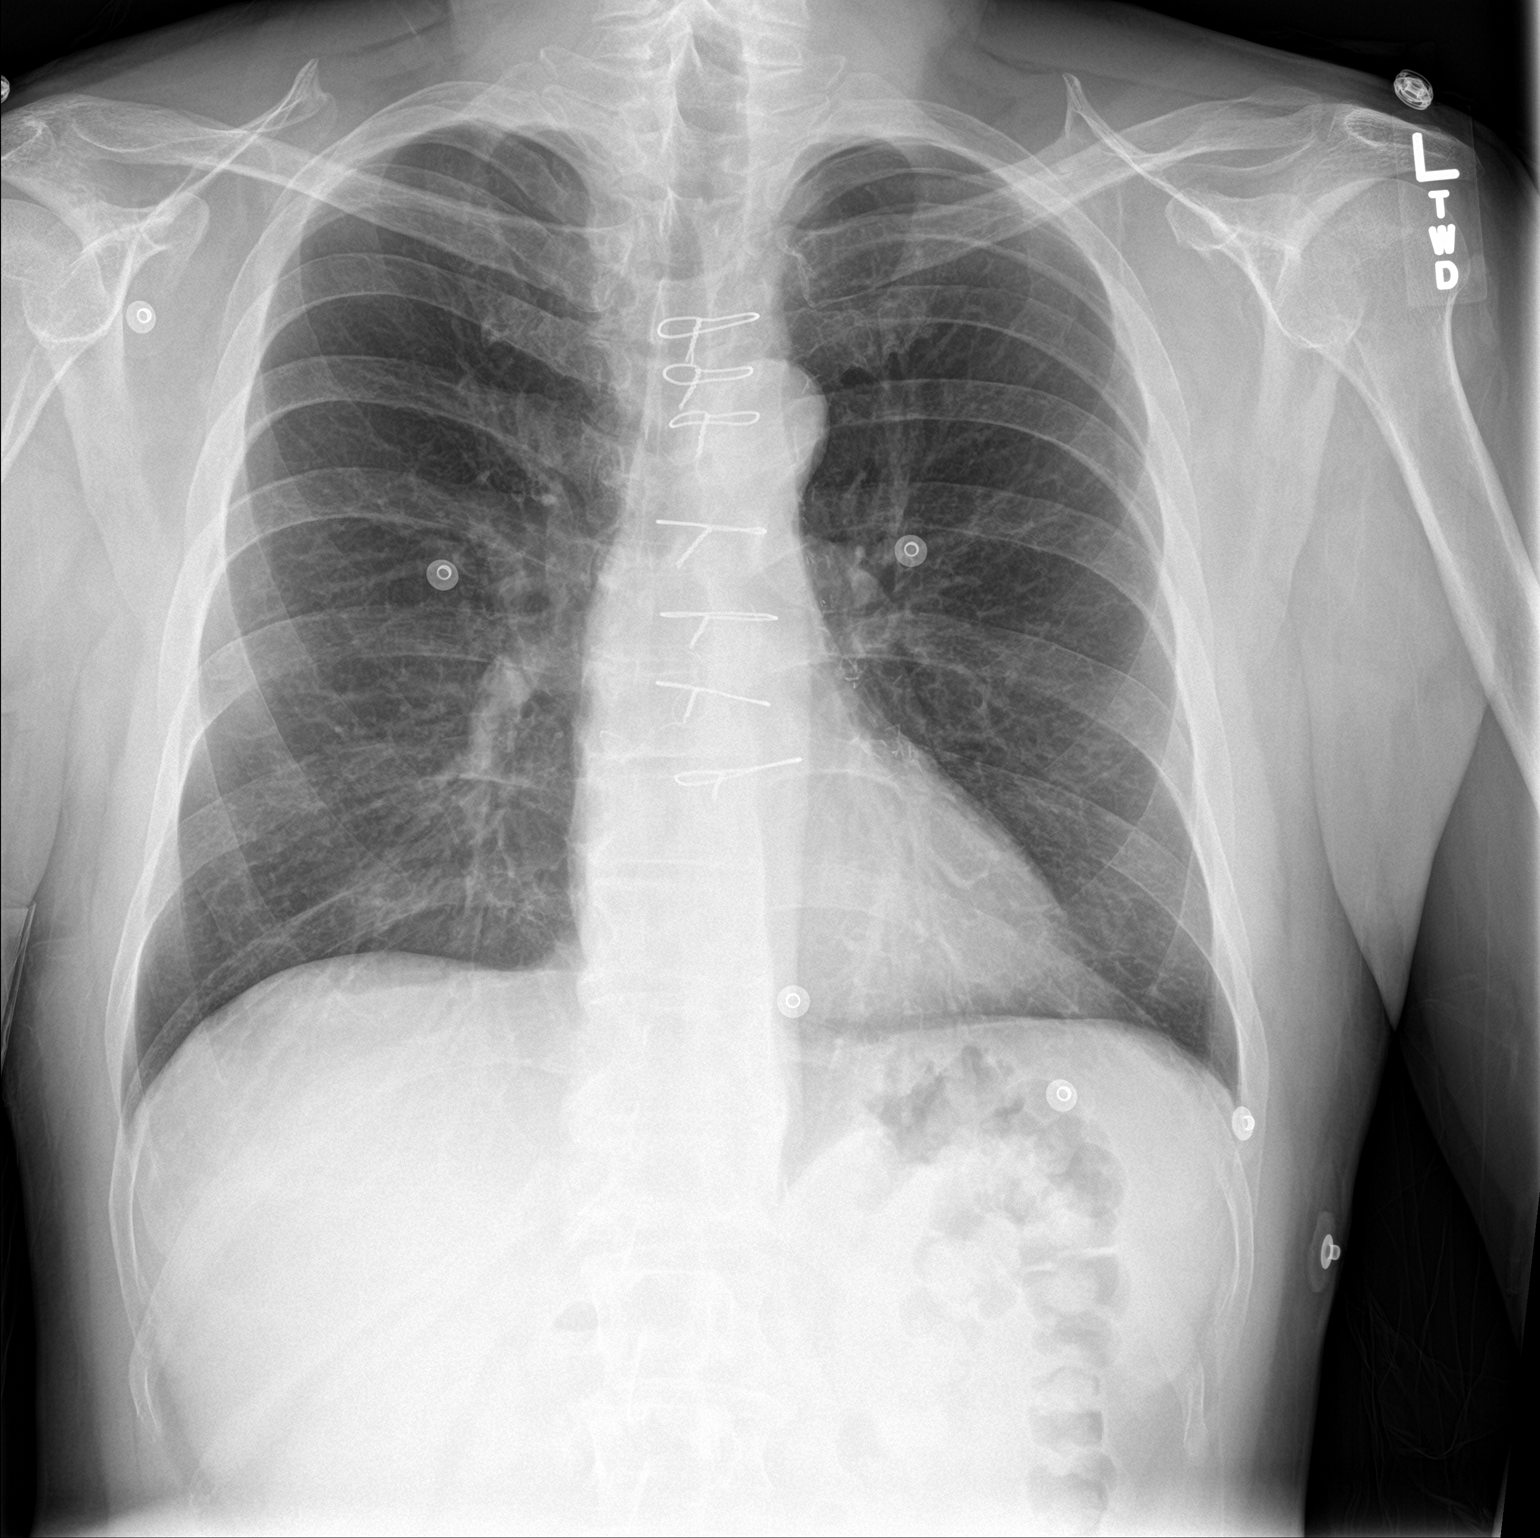

[chest lat]
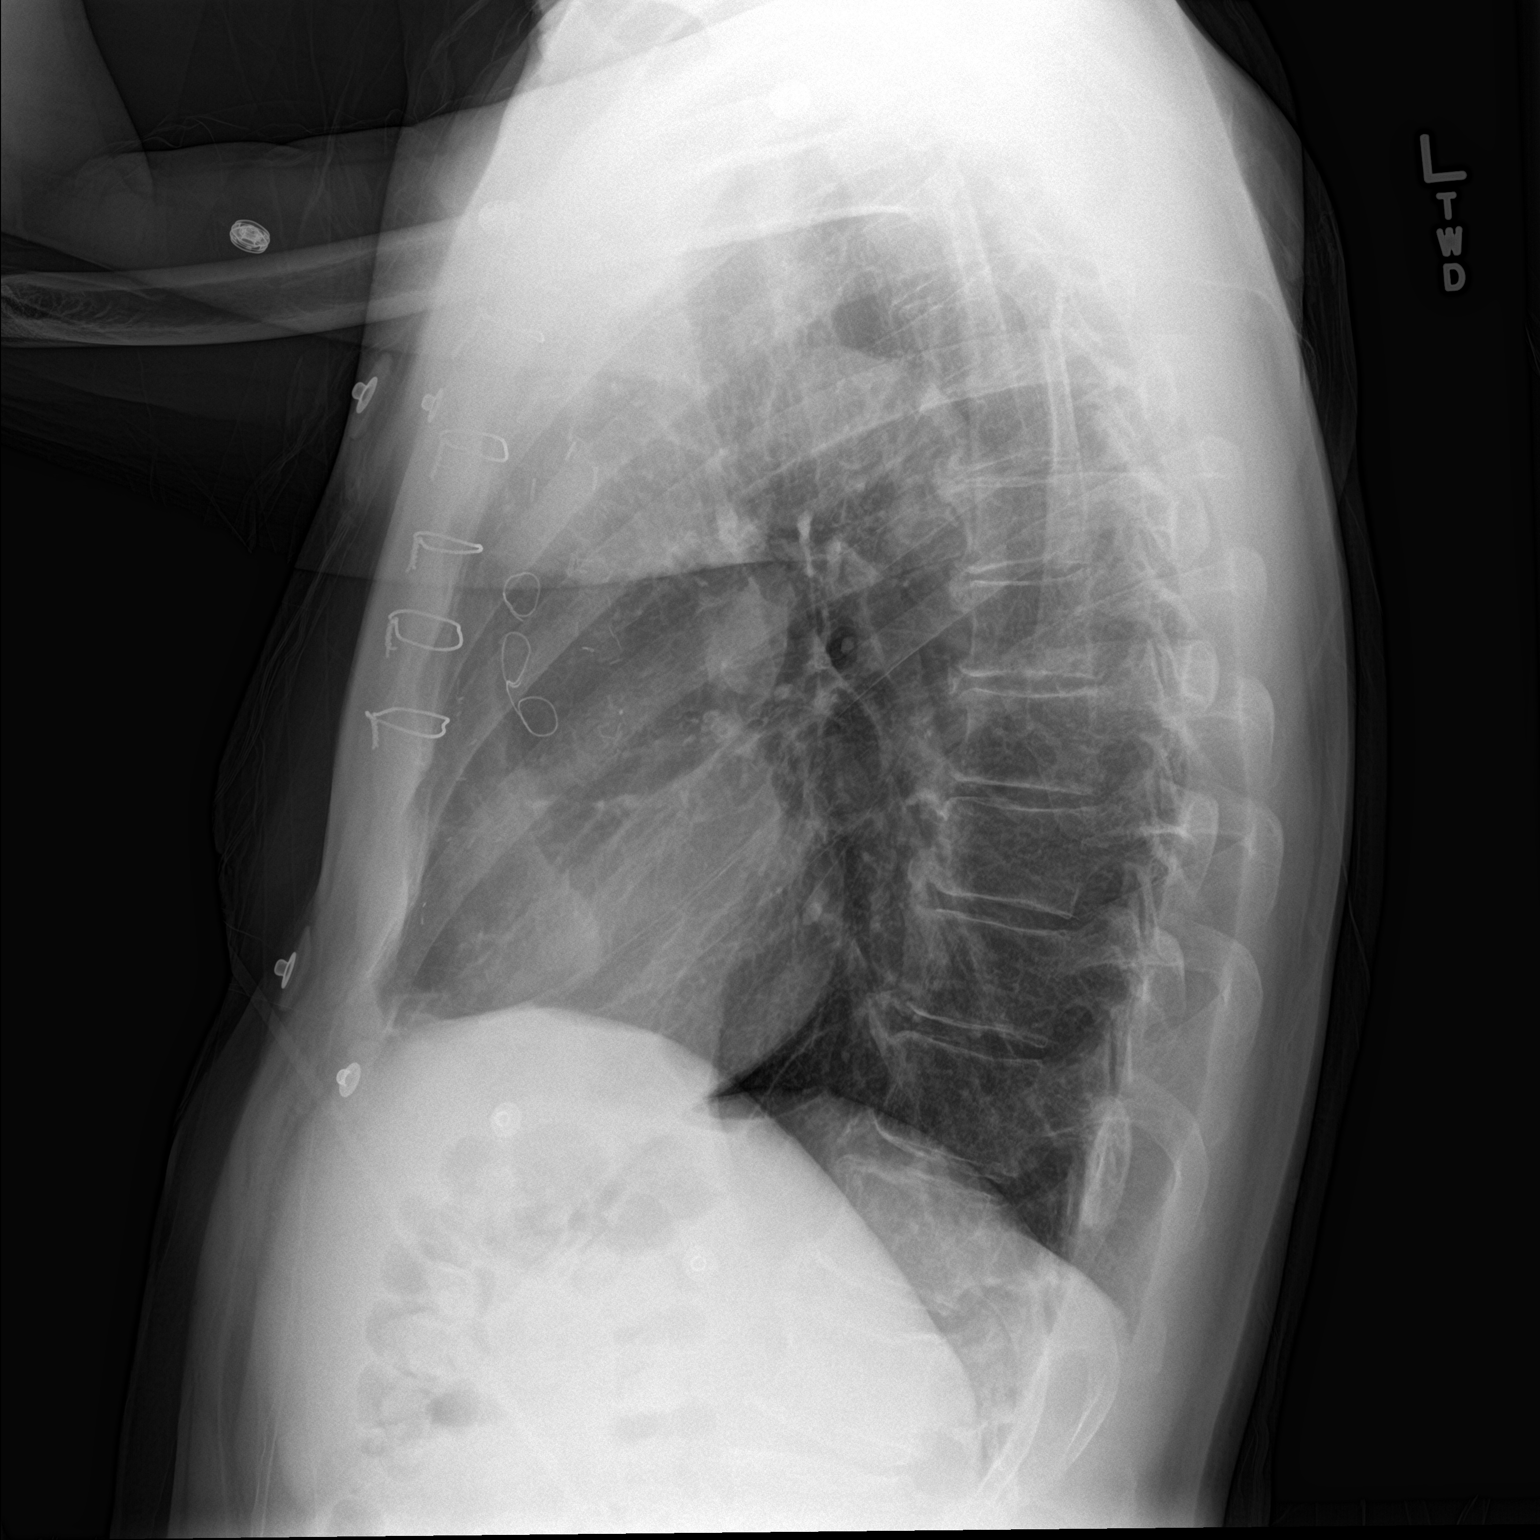

[2 of 2 positions shown; findings below may reference images not displayed]

FINDINGS: Cardiomediastinal silhouette is stable. Status post CABG. No acute
infiltrate or pleural effusion. No pulmonary edema. Mild
degenerative changes thoracic spine.
IMPRESSION: No active cardiopulmonary disease.

## 2018-02-24 ENCOUNTER — Ambulatory Visit: Payer: BC Managed Care – PPO | Admitting: Physician Assistant

## 2018-02-24 ENCOUNTER — Encounter: Payer: Self-pay | Admitting: Physician Assistant

## 2018-02-24 VITALS — BP 120/74 | HR 85 | Ht 69.0 in | Wt 197.0 lb

## 2018-02-24 DIAGNOSIS — I25119 Atherosclerotic heart disease of native coronary artery with unspecified angina pectoris: Secondary | ICD-10-CM | POA: Diagnosis not present

## 2018-02-24 DIAGNOSIS — R911 Solitary pulmonary nodule: Secondary | ICD-10-CM

## 2018-02-24 DIAGNOSIS — I10 Essential (primary) hypertension: Secondary | ICD-10-CM

## 2018-02-24 DIAGNOSIS — I471 Supraventricular tachycardia: Secondary | ICD-10-CM

## 2018-02-24 DIAGNOSIS — E7800 Pure hypercholesterolemia, unspecified: Secondary | ICD-10-CM

## 2018-02-24 MED ORDER — METOPROLOL SUCCINATE ER 100 MG PO TB24: 100.0000 mg | ORAL_TABLET | Freq: Every day | ORAL | 3 refills | Status: DC

## 2018-02-24 MED ORDER — RANOLAZINE ER 1000 MG PO TB12: 1000.0000 mg | ORAL_TABLET | Freq: Two times a day (BID) | ORAL | 3 refills | Status: DC

## 2018-02-24 NOTE — Progress Notes (Signed)
Cardiology Office Note:    Date:  02/24/2018   ID:  [name], DOB Jul 24, 1956, MRN [id number]  PCP:  [name], MD  Cardiologist:  Larae Grooms, MD   Referring MD: [name], MD   Chief Complaint  Patient presents with  . Hospitalization Follow-up    Chest pain    History of Present Illness:    [name] is a 62 y.o. male with coronary artery disease status post anterior myocardial infarction in March 2017 treated with emergent CABG, systolic heart failure secondary to ischemic cardiomyopathy, prior right brain stroke treated with TPA in March 2018.  Follow-up echocardiogram in May 2017 demonstrated improved LV function with EF 45-50%.  He has had multiple cardiac catheterization since his bypass surgery with known occluded vein graft to the diagonal and moderate disease in the SVG-RCA.  He underwent PCI with DES to the native RCA in February 2019.  He had improved symptoms after PCI and was last seen by Dr. Irish Lack May 2019.  He was admitted 6/11-6/13 pain and rapid palpitations.  Troponin levels remained negative.  Telemetry demonstrated ectopic atrial tachycardia.  Beta-blocker dose was adjusted.  Chest CTA was negative for aortic aneurysm or dissection, pulmonary embolism.  There was a 2 mm nodular opacity in the anterior segment left upper lobe.  Follow-up noncontrast chest CT was recommended in 12 months.  It was suspected that his pain was referred from lumbar disc disease.  Mr. Boy returns for post hospital sedation follow-up.  He continues to have episodes of chest discomfort.  He also notes "heavy heartbeat."  The palpitations seem to occur mainly at night.  He denies exertional symptoms.  His chest symptoms are associated with nausea but no shortness of breath or diaphoresis.  He has taken nitroglycerin with questionable relief.  He denies dyspnea exertion, orthopnea, PND.  He said mild pedal edema in the past.  Prior CV studies:   The following  studies were reviewed today:  Cardiac catheterization 10/05/2017 LAD ostial 12, mid 80 RI ostial 90, 75 LCx ostial 80, proximal 50 RCA ostial 75, distal 50, RPDA 40 LIMA-LAD patent SVG-DX 100 SVG-OM patent SVG-RCA 70 EF 55-65 PCI: 4 x 16 mm Synergy DES to the ostial RCA  Nuclear stress test 06/22/2017 EF 49, anterior apical MI, no ischemia, intermediate risk  GXT 01/13/2017 Normal blood pressure response to exercise, no ST segment deviation during stress; no ischemia  Event Monitor 11/23/16  Normal sinus rhythm predominantly.  Sinus bradycardia intermittently. Lowest HR 41 bpm. No symptoms reported at that time.  Patient's symptoms did not correlate to any significant arrhythmias.  Echocardiogram 11/09/2016 EF 55-60, normal wall motion, grade 1 diastolic dysfunction, MAC, trace MR, trace TR  Event monitor 04/27/16 NSR, sinus brady, 3.5 sec pause during sleep; no significant arrhythmias  Nuclear stress test 04/20/2016 EF 54, no ischemia, high risk due to transient ischemic dilatation  Limited echo 12/30/2015 EF 45-50, mild anterior hypokinesis  Intraoperative TEE EF 25-30%  LHC 11/28/15 LAD ostial 100% >> PCI: POBA only with 40% residual LCx ostial 90%, proximal 50% RCA proximal 80% Ostial LAD occlusion is a culprit for his symptoms. Significant disease in his ostial circumflex and proximal right as well. Patient had balloon pump placed and then was sent to the operating room for bypass surgery.  Past Medical History:  Diagnosis Date  . Anxiety   . Basal cell carcinoma (BCC) of forehead   . CAD (coronary artery disease)    a. 10/2015 ant STEMI >>  LHC with 3 v CAD; oLAD tx with POBA >> emergent CABG. b. Multiple evals since that time, early graft failure of SVG-RCA by cath 03/2016. c. 2/19 PCI/DES x1 to pRCA, normal EF.  Marland Kitchen Depression   . Dyspnea   . Ectopic atrial tachycardia ([place])   . Esophageal reflux    eosinophil esophagitis  . Family history of adverse reaction to  anesthesia    "sister has PONV" (06/21/2017)  . Former tobacco use   . Gout   . Hepatitis C    "treated and cured" (06/21/2017)  . High cholesterol   . Hypertension   . Ischemic cardiomyopathy    a. EF 25-30% at intraop TEE 4/17  //  b. Limited Echo 5/17 - EF 45-50%, mild ant HK. c. EF 55-65% by cath 09/2017.  . Migraine    "3-4/yr" (06/21/2017)  . Myocardial infarction ([place]) 10/2015  . Sinus bradycardia    a. HR dropping into 40s in 02/2016 -> BB reduced.  . Stroke ([place]) 10/2016   "small one; sometimes my memory/cognitive issues" (06/21/2017)  . Symptomatic hypotension    a. 02/2016 ER visit -> meds reduced.   Surgical Hx: The patient  has a past surgical history that includes Cardiac catheterization (N/A, 06/14/2016); LEFT HEART CATH AND CORS/GRAFTS ANGIOGRAPHY (N/A, 03/11/2017); Humerus surgery (Right, 1969); Excision basal cell carcinoma; Cardiac catheterization (N/A, 11/28/2015); Cardiac catheterization (N/A, 11/28/2015); Cardiac catheterization (N/A, 11/28/2015); Cardiac catheterization (N/A, 04/21/2016); Cardiac catheterization (N/A, 06/14/2016); Cardiac catheterization (N/A, 09/08/2016); Cardiac catheterization; Coronary artery bypass graft (N/A, 11/28/2015); LEFT HEART CATH AND CORS/GRAFTS ANGIOGRAPHY (N/A, 10/05/2017); and CORONARY STENT INTERVENTION (N/A, 10/05/2017).   Current Medications: Current Meds  Medication Sig  . acetaminophen (TYLENOL) 500 MG tablet Take 1,000 mg by mouth every 6 (six) hours as needed for mild pain or headache.   Marland Kitchen amLODipine (NORVASC) 10 MG tablet Take 1 tablet (10 mg total) by mouth daily.  Marland Kitchen aspirin 81 MG tablet Take 81 mg by mouth daily.  Marland Kitchen atorvastatin (LIPITOR) 80 MG tablet Take 1 tablet (80 mg total) by mouth daily.  . chlordiazePOXIDE (LIBRIUM) 10 MG capsule Take 10 mg by mouth 4 (four) times daily as needed for anxiety.   . clopidogrel (PLAVIX) 75 MG tablet take 1 tablet by mouth once daily  . fluticasone (FLONASE) 50 MCG/ACT nasal spray Place 1 spray  into both nostrils as needed for allergies.   Marland Kitchen lisinopril (PRINIVIL,ZESTRIL) 5 MG tablet Take 5 mg by mouth daily. May take 1 additional tablet once daily for SBP greater than 150 once hour after taking daily dose (Patient taking differently: Take 5 mg by mouth daily. Take 5 mg by mouth daily. May take 1 additional tablet once daily for SBP greater than 150 once hour after taking daily dose)  . Multiple Vitamins-Minerals (ONE-A-DAY MENS 50+ ADVANTAGE PO) Take 1 tablet by mouth daily.   . nitroGLYCERIN (NITROSTAT) 0.4 MG SL tablet Place 1 tablet (0.4 mg total) under the tongue every 5 (five) minutes as needed for chest pain. 3 DOSE MAX  . pantoprazole (PROTONIX) 40 MG tablet Take 40 mg by mouth daily.  . traZODone (DESYREL) 50 MG tablet Take 50-150 mg by mouth at bedtime as needed for sleep.   . [DISCONTINUED] metoprolol succinate (TOPROL-XL) 25 MG 24 hr tablet Take 3 tablets (75 mg total) by mouth daily.  . [DISCONTINUED] ranolazine (RANEXA) 1000 MG SR tablet Take 1,000 mg by mouth daily.     Allergies:   Prednisone; Tetanus toxoids; Wellbutrin [bupropion]; Morphine and related;  and Chantix [varenicline]   Social History   Tobacco Use  . Smoking status: Former Smoker    Packs/day: 0.75    Years: 44.00    Pack years: 33.00    Types: Cigarettes    Last attempt to quit: 11/28/2015    Years since quitting: 2.2  . Smokeless tobacco: Never Used  Substance Use Topics  . Alcohol use: No    Frequency: Never    Comment: 06/21/2017 "I'll have a few drinks q couple months"  . Drug use: No    Comment: 06/21/2017 "nothing since the 1980s"     Family Hx: The patient's family history includes Heart Problems in his father; Heart attack in his maternal grandmother and paternal uncle; Heart attack (age of onset: 41) in his father; Heart failure in his father; Hypertension in his brother; Lung cancer in his mother; Stroke in his father and maternal grandmother. There is no history of Autoimmune  disease.  ROS:   Please see the history of present illness.    Review of Systems  Constitution: Positive for decreased appetite and malaise/fatigue.  Cardiovascular: Positive for chest pain.  Musculoskeletal: Positive for joint pain.  Gastrointestinal: Positive for diarrhea.  Neurological: Positive for headaches.  Psychiatric/Behavioral: The patient is nervous/anxious.    All other systems reviewed and are negative.   EKGs/Labs/Other Test Reviewed:    EKG:  EKG is  ordered today.  The ekg ordered today demonstrates normal sinus rhythm, heart rate 85, normal axis, QTC 416, similar to prior tracing  Recent Labs: 01/03/2018: ALT 15 02/07/2018: B Natriuretic Peptide 28.0; Magnesium 2.1; TSH 1.319 02/08/2018: BUN 14; Creatinine, Ser 1.12; Hemoglobin 14.1; Platelets 217; Potassium 4.2; Sodium 141   Recent Lipid Panel Lab Results  Component Value Date/Time   CHOL 114 11/08/2017 08:33 AM   TRIG 93 11/08/2017 08:33 AM   HDL 52 11/08/2017 08:33 AM   CHOLHDL 2.2 11/08/2017 08:33 AM   CHOLHDL 2.2 06/22/2017 02:56 AM   LDLCALC 43 11/08/2017 08:33 AM    Physical Exam:    VS:  BP 120/74   Pulse 85   Ht _0  (1.753 m)   Wt 197 lb (89.4 kg)   SpO2 95%   BMI 29.09 kg/m     Wt Readings from Last 3 Encounters:  02/24/18 197 lb (89.4 kg)  02/09/18 192 lb 6.4 oz (87.3 kg)  01/10/18 200 lb (90.7 kg)     Physical Exam  Constitutional: He is oriented to person, place, and time. He appears well-developed and well-nourished. No distress.  HENT:  Head: Normocephalic and atraumatic.  Neck: Neck supple. No JVD present.  Cardiovascular: Normal rate, regular rhythm, S1 normal and S2 normal.  No murmur heard. Pulmonary/Chest: Breath sounds normal. He has no rales.  Abdominal: Soft. There is no hepatomegaly.  Musculoskeletal: He exhibits no edema.  Neurological: He is alert and oriented to person, place, and time.  Skin: Skin is warm and dry.    ASSESSMENT & PLAN:    Coronary artery  disease involving native coronary artery of native heart with angina pectoris ([place])  History of anterior MI followed by emergent CABG in 2017.  He has had multiple cardiac catheterizations since that time.  Last heart catheterization was performed in February 2019 and he underwent DES to the native RCA.  He has a known occluded vein graft to the diagonal and significant disease in the SVG-RCA.  He follows up today after a recent admission for recurrent chest pain.  Cardiac markers were  negative.  He does have some symptoms that seem to be associated with palpitations.  He denies any exertional symptoms.  His pain does remind him somewhat of his previous angina.  At this point, I have recommended continued titration of his antianginal therapy.  If he fails this, we will need to consider whether or not to proceed with stress testing versus cardiac catheterization.  -Continue amlodipine, aspirin, atorvastatin, clopidogrel, lisinopril.    -Increase Ranexa to 1000 mg twice daily  -Increase metoprolol succinate to 100 mg daily  -Follow-up 6 weeks  Pulmonary nodule Small nodule noted on chest CTA in the hospital.  He needs a follow-up CT without contrast in 1 year.  This will be arranged.  Atrial tachycardia ([place]) He had elevated heart rates into the 140s-160s in the hospital.  His beta-blocker dose was adjusted.  As noted, I will increase his Toprol to 100 mg daily.  If he continues to have symptoms, we may need to consider referral to EP.  Essential hypertension The patient's blood pressure is controlled on his current regimen.  Continue current therapy.   Hypercholesteremia LDL optimal on most recent lab work.  Continue current Rx.     Dispo:  Return in about 6 weeks (around 04/07/2018) for Close Follow Up w/ Dr. Irish Lack, or [name], PA-C.   Medication Adjustments/Labs and Tests Ordered: Current medicines are reviewed at length with the patient today.  Concerns regarding medicines are outlined  above.  Tests Ordered: Orders Placed This Encounter  Procedures  . CT ANGIO CHEST AORTA W &/OR WO CONTRAST  . CT Chest Wo Contrast  . Basic Metabolic Panel (BMET)  . EKG 12-Lead   Medication Changes: Meds ordered this encounter  Medications  . ranolazine (RANEXA) 1000 MG SR tablet    Sig: Take 1 tablet (1,000 mg total) by mouth 2 (two) times daily.    Dispense:  180 tablet    Refill:  3    DOSE INCREASE  . metoprolol succinate (TOPROL-XL) 100 MG 24 hr tablet    Sig: Take 1 tablet (100 mg total) by mouth daily. Take with or immediately following a meal.    Dispense:  90 tablet    Refill:  3    Signed, [name], PA-C  02/24/2018 2:22 PM    [facility] [address], [place], [place]  [address] Phone: [phone]; Fax: [phone]

## 2018-02-24 NOTE — Addendum Note (Signed)
Addended by: Michae Kava on: 02/24/2018 02:39 PM   Modules accepted: Orders

## 2018-02-24 NOTE — Patient Instructions (Addendum)
Medication Instructions:  1. INCREASE TOPROL XL 100 MG DAILY ; (YOU MAY FINISH USING THE 25 MG TABLETS BY TAKING 4 TABLETS ALL AT THE SAME TIME TO = 100 MG, ONCE YOU FINISH THE 25 MG TABLET BOTTLE PLEASE CALL THE OFFICE SO THAT WE MAY SEND IN FOR THE 100 MG TABLET   Labwork: NONE ORDERED TODAY   Testing/Procedures: CHEST CT WITH OUT CONTRAST TO BE DONE IN 1 YEAR   Follow-Up: DR. VARANASI IN 6 WEEKS   Any Other Special Instructions Will Be Listed Below (If Applicable).     If you need a refill on your cardiac medications before your next appointment, please call your pharmacy.

## 2018-03-06 ENCOUNTER — Telehealth: Payer: Self-pay | Admitting: Interventional Cardiology

## 2018-03-06 NOTE — Telephone Encounter (Signed)
Pt calling  Pt c/o medication issue:  1. Name of Medication: Metoprolol 100mg    2. How are you currently taking this medication (dosage and times per day)? 1 daily  3. Are you having a reaction (difficulty breathing--STAT)? No  4. What is your medication issue? Diarrhea     Pt c/o BP issue: STAT if pt c/o blurred vision, one-sided weakness or slurred speech  1. What are your last 5 BP readings?  Now  135/85 7.8.19  This morning  180/120  Pulse  140  2. Are you having any other symptoms (ex. Dizziness, headache, blurred vision, passed out)? Lightheaded  3. What is your BP issue? High Bp  Pt stated he took a nitro and lisinopril

## 2018-03-07 NOTE — Telephone Encounter (Signed)
Pt called yesterday and message was sent to [facility], which was routed to Triage today. Call was then routed to me from Triage. I did call the pt in regards to message sent to me. In the message pt states he did take NTG yesterday and BP yesterday 180/120 with heart rate of 140. I will route this call back to Triage for a nurse to call the pt for further evaluation. Pt states since he saw [name], [place] 02/24/18 and Metoprolol was changed to take 100 mg daily at one time, as to pt was on 25 mg TID. Pt states he has not been able to tolerate the 100 mg all at once and has cut himself back to Metoprolol 50 mg daily. I asked pt if he had chest pain yesterday as that he took the NTG. Pt answered no and states he took the NTG to help lower his BP and took an extra Lisinopril 5 mg yesterday to help lower his BP. Pt does state he has been having light headedness. I did state to the pt that I will have a Triage nurse call him in the next few minutes to follow up on my call with him and his symptoms. Pt thanked me for the call.

## 2018-03-07 NOTE — Telephone Encounter (Signed)
Pt reports currently doing fine. We discussed his conversation w/ Arbie Cookey.  Pt is only taking Toprol 25-50 mg daily currently.  Per description from pt about GI issues after increasing Toprol (100mg ),  he stopped taking this dose on his own and went back to 1-2 tablets of 25 mg.  Pt states that he cannot tolerate greater than Toprol 25 mg secondary to severe GI issues. Advised pt about NTG and not taking it for BP. Pt understands that Arbie Cookey will discuss with [name] and call him tomorrow w/ recommendation/s. Patient verbalized understanding and agreeable to plan.

## 2018-03-08 MED ORDER — METOPROLOL SUCCINATE ER 50 MG PO TB24: 50.0000 mg | ORAL_TABLET | ORAL | 3 refills | Status: DC

## 2018-03-08 NOTE — Telephone Encounter (Signed)
At last visit, we had that he was taking Toprol 75 mg QD. If he was able to tolerate 75 mg QD, take Toprol 50 mg in AM and 25 mg in PM. If Toprol 50 mg QD is better tolerated, continue Toprol 50 mg QD.  Check BP once daily for 2 weeks and send readings.  Ok to take extra Lisinopril 5 mg x 1 prn for BP > 170/100.  Ok to take extra Toprol 25 mg x 1 prn for sustained HRs > 120. [name], PA-C    03/08/2018 1:22 PM

## 2018-03-08 NOTE — Telephone Encounter (Signed)
Called the pt and advised pt of recommendations from [place], [place]. Pt aware take Toprol 50 mg in the AM and 25 mg in the PM. If pt feels he cannot tolerate the 50 mg in the AM and 25 mg in the PM then he can change to Toprol 50 mg once a day. I advised pt to monitor BP x 2 week and call BP and HR readings to [phone]. Pt has been advised if his BP is >170/100 then he can take an extra Lisinopril 5 mg that day; pt aware if his heart rate is >120 then ok to take extra Toprol 25 mg that day. Pt is agreeable to plan of care and thanked me for the call.

## 2018-03-24 ENCOUNTER — Inpatient Hospital Stay (HOSPITAL_BASED_OUTPATIENT_CLINIC_OR_DEPARTMENT_OTHER)
Admission: EM | Admit: 2018-03-24 | Discharge: 2018-03-26 | DRG: 303 | Disposition: A | Discharge status: Home / Self Care | Payer: BC Managed Care – PPO | Attending: Family Medicine | Admitting: Family Medicine

## 2018-03-24 ENCOUNTER — Encounter (HOSPITAL_BASED_OUTPATIENT_CLINIC_OR_DEPARTMENT_OTHER): Payer: Self-pay | Admitting: *Deleted

## 2018-03-24 ENCOUNTER — Other Ambulatory Visit: Payer: Self-pay

## 2018-03-24 ENCOUNTER — Emergency Department (HOSPITAL_BASED_OUTPATIENT_CLINIC_OR_DEPARTMENT_OTHER): Payer: BC Managed Care – PPO

## 2018-03-24 DIAGNOSIS — R079 Chest pain, unspecified: Secondary | ICD-10-CM | POA: Diagnosis present

## 2018-03-24 DIAGNOSIS — E785 Hyperlipidemia, unspecified: Secondary | ICD-10-CM | POA: Diagnosis present

## 2018-03-24 DIAGNOSIS — I5022 Chronic systolic (congestive) heart failure: Secondary | ICD-10-CM | POA: Diagnosis present

## 2018-03-24 DIAGNOSIS — Z7902 Long term (current) use of antithrombotics/antiplatelets: Secondary | ICD-10-CM | POA: Diagnosis not present

## 2018-03-24 DIAGNOSIS — Z85828 Personal history of other malignant neoplasm of skin: Secondary | ICD-10-CM

## 2018-03-24 DIAGNOSIS — Z87891 Personal history of nicotine dependence: Secondary | ICD-10-CM

## 2018-03-24 DIAGNOSIS — I110 Hypertensive heart disease with heart failure: Secondary | ICD-10-CM | POA: Diagnosis present

## 2018-03-24 DIAGNOSIS — I10 Essential (primary) hypertension: Secondary | ICD-10-CM | POA: Diagnosis present

## 2018-03-24 DIAGNOSIS — Z951 Presence of aortocoronary bypass graft: Secondary | ICD-10-CM | POA: Diagnosis not present

## 2018-03-24 DIAGNOSIS — I25110 Atherosclerotic heart disease of native coronary artery with unstable angina pectoris: Principal | ICD-10-CM | POA: Diagnosis present

## 2018-03-24 DIAGNOSIS — I255 Ischemic cardiomyopathy: Secondary | ICD-10-CM | POA: Diagnosis present

## 2018-03-24 DIAGNOSIS — F419 Anxiety disorder, unspecified: Secondary | ICD-10-CM | POA: Diagnosis present

## 2018-03-24 DIAGNOSIS — R21 Rash and other nonspecific skin eruption: Secondary | ICD-10-CM | POA: Diagnosis present

## 2018-03-24 DIAGNOSIS — Z888 Allergy status to other drugs, medicaments and biological substances status: Secondary | ICD-10-CM

## 2018-03-24 DIAGNOSIS — Z8673 Personal history of transient ischemic attack (TIA), and cerebral infarction without residual deficits: Secondary | ICD-10-CM

## 2018-03-24 DIAGNOSIS — R072 Precordial pain: Secondary | ICD-10-CM | POA: Diagnosis not present

## 2018-03-24 DIAGNOSIS — Z887 Allergy status to serum and vaccine status: Secondary | ICD-10-CM | POA: Diagnosis not present

## 2018-03-24 DIAGNOSIS — Z885 Allergy status to narcotic agent status: Secondary | ICD-10-CM

## 2018-03-24 DIAGNOSIS — R55 Syncope and collapse: Secondary | ICD-10-CM | POA: Diagnosis present

## 2018-03-24 DIAGNOSIS — Z7982 Long term (current) use of aspirin: Secondary | ICD-10-CM

## 2018-03-24 DIAGNOSIS — I25119 Atherosclerotic heart disease of native coronary artery with unspecified angina pectoris: Secondary | ICD-10-CM | POA: Diagnosis present

## 2018-03-24 DIAGNOSIS — M79606 Pain in leg, unspecified: Secondary | ICD-10-CM | POA: Diagnosis present

## 2018-03-24 DIAGNOSIS — K219 Gastro-esophageal reflux disease without esophagitis: Secondary | ICD-10-CM | POA: Diagnosis present

## 2018-03-24 DIAGNOSIS — I252 Old myocardial infarction: Secondary | ICD-10-CM

## 2018-03-24 DIAGNOSIS — Z79899 Other long term (current) drug therapy: Secondary | ICD-10-CM | POA: Diagnosis not present

## 2018-03-24 DIAGNOSIS — Z8249 Family history of ischemic heart disease and other diseases of the circulatory system: Secondary | ICD-10-CM | POA: Diagnosis not present

## 2018-03-24 DIAGNOSIS — I471 Supraventricular tachycardia: Secondary | ICD-10-CM | POA: Diagnosis present

## 2018-03-24 LAB — TROPONIN I
Troponin I: <0.03 ng/mL (ref <0.03)
Troponin I: <0.03 ng/mL (ref <0.03)

## 2018-03-24 LAB — CBC
HCT: 43.9 % (ref 39.0–52.0)
Hemoglobin: 14.9 g/dL (ref 13.0–17.0)
MCH: 31 pg (ref 26.0–34.0)
MCHC: 33.9 g/dL (ref 30.0–36.0)
MCV: 91.5 fL (ref 78.0–100.0)
Platelets: 216 10*3/uL (ref 150–400)
RBC: 4.8 MIL/uL (ref 4.22–5.81)
RDW: 13.1 % (ref 11.5–15.5)
WBC: 7.4 10*3/uL (ref 4.0–10.5)

## 2018-03-24 LAB — BASIC METABOLIC PANEL
Anion gap: 6 (ref 5–15)
BUN: 21 mg/dL (ref 8–23)
CO2: 29 mmol/L (ref 22–32)
Calcium: 9.4 mg/dL (ref 8.9–10.3)
Chloride: 104 mmol/L (ref 98–111)
Creatinine, Ser: 1.02 mg/dL (ref 0.61–1.24)
GFR calc Af Amer: >60 mL/min (ref >60)
GFR calc non Af Amer: >60 mL/min (ref >60)
Glucose, Bld: 90 mg/dL (ref 70–99)
Potassium: 4.4 mmol/L (ref 3.5–5.1)
Sodium: 139 mmol/L (ref 135–145)

## 2018-03-24 LAB — D-DIMER, QUANTITATIVE: D-Dimer, Quant: 0.29 ug/mL-FEU (ref 0.00–0.50)

## 2018-03-24 MED ORDER — SODIUM CHLORIDE 0.9% FLUSH
3.0000 mL | Freq: Two times a day (BID) | INTRAVENOUS | Status: DC
  Administered 2018-03-25 – 2018-03-26 (×4): 3 mL via INTRAVENOUS

## 2018-03-24 MED ORDER — PANTOPRAZOLE SODIUM 40 MG PO TBEC
40.0000 mg | DELAYED_RELEASE_TABLET | Freq: Every day | ORAL | Status: DC
  Administered 2018-03-25 – 2018-03-26 (×2): 40 mg via ORAL
  Filled 2018-03-24 (×2): qty 1

## 2018-03-24 MED ORDER — ATORVASTATIN CALCIUM 80 MG PO TABS
80.0000 mg | ORAL_TABLET | Freq: Every day | ORAL | Status: DC
  Administered 2018-03-25: 80 mg via ORAL
  Filled 2018-03-24: qty 1

## 2018-03-24 MED ORDER — SODIUM CHLORIDE 0.9% FLUSH
3.0000 mL | Freq: Two times a day (BID) | INTRAVENOUS | Status: DC
  Administered 2018-03-25 – 2018-03-26 (×3): 3 mL via INTRAVENOUS

## 2018-03-24 MED ORDER — LISINOPRIL 5 MG PO TABS
5.0000 mg | ORAL_TABLET | Freq: Every day | ORAL | Status: DC
  Administered 2018-03-25 – 2018-03-26 (×2): 5 mg via ORAL
  Filled 2018-03-24 (×2): qty 1

## 2018-03-24 MED ORDER — ADULT MULTIVITAMIN W/MINERALS CH
1.0000 | ORAL_TABLET | Freq: Every day | ORAL | Status: DC
  Administered 2018-03-25 – 2018-03-26 (×2): 1 via ORAL
  Filled 2018-03-24 (×2): qty 1

## 2018-03-24 MED ORDER — ONDANSETRON HCL 4 MG/2ML IJ SOLN
4.0000 mg | Freq: Once | INTRAMUSCULAR | Status: AC
  Administered 2018-03-24: 4 mg via INTRAVENOUS
  Filled 2018-03-24: qty 2

## 2018-03-24 MED ORDER — METOPROLOL SUCCINATE ER 25 MG PO TB24
25.0000 mg | ORAL_TABLET | Freq: Every day | ORAL | Status: DC
  Administered 2018-03-24 – 2018-03-25 (×2): 25 mg via ORAL
  Filled 2018-03-24 (×2): qty 1

## 2018-03-24 MED ORDER — BISACODYL 5 MG PO TBEC: 5.0000 mg | DELAYED_RELEASE_TABLET | Freq: Every day | ORAL | Status: DC | PRN

## 2018-03-24 MED ORDER — RANOLAZINE ER 500 MG PO TB12
1000.0000 mg | ORAL_TABLET | Freq: Two times a day (BID) | ORAL | Status: DC
  Administered 2018-03-25 – 2018-03-26 (×3): 1000 mg via ORAL
  Filled 2018-03-24 (×3): qty 2

## 2018-03-24 MED ORDER — ACETAMINOPHEN 650 MG RE SUPP: 650.0000 mg | Freq: Four times a day (QID) | RECTAL | Status: DC | PRN

## 2018-03-24 MED ORDER — ACETAMINOPHEN 500 MG PO TABS
1000.0000 mg | ORAL_TABLET | Freq: Once | ORAL | Status: AC
  Administered 2018-03-24: 1000 mg via ORAL
  Filled 2018-03-24: qty 2

## 2018-03-24 MED ORDER — MORPHINE SULFATE (PF) 4 MG/ML IV SOLN
4.0000 mg | Freq: Once | INTRAVENOUS | Status: AC
Start: 2018-03-24 — End: 2018-03-24
  Administered 2018-03-24: 4 mg via INTRAVENOUS
  Filled 2018-03-24: qty 1

## 2018-03-24 MED ORDER — CHLORDIAZEPOXIDE HCL 5 MG PO CAPS
10.0000 mg | ORAL_CAPSULE | Freq: Four times a day (QID) | ORAL | Status: DC | PRN
  Administered 2018-03-25: 10 mg via ORAL
  Filled 2018-03-24: qty 2

## 2018-03-24 MED ORDER — ASPIRIN 81 MG PO CHEW
81.0000 mg | CHEWABLE_TABLET | Freq: Every day | ORAL | Status: DC
  Administered 2018-03-25 – 2018-03-26 (×2): 81 mg via ORAL
  Filled 2018-03-24 (×2): qty 1

## 2018-03-24 MED ORDER — MORPHINE SULFATE (PF) 4 MG/ML IV SOLN
4.0000 mg | Freq: Once | INTRAVENOUS | Status: AC
  Administered 2018-03-24: 4 mg via INTRAVENOUS
  Filled 2018-03-24: qty 1

## 2018-03-24 MED ORDER — METOPROLOL SUCCINATE ER 50 MG PO TB24
50.0000 mg | ORAL_TABLET | Freq: Every day | ORAL | Status: DC
  Administered 2018-03-25 – 2018-03-26 (×2): 50 mg via ORAL
  Filled 2018-03-24 (×2): qty 1

## 2018-03-24 MED ORDER — SODIUM CHLORIDE 0.9% FLUSH: 3.0000 mL | INTRAVENOUS | Status: DC | PRN

## 2018-03-24 MED ORDER — ASPIRIN 81 MG PO CHEW
324.0000 mg | CHEWABLE_TABLET | Freq: Once | ORAL | Status: AC
  Administered 2018-03-24: 324 mg via ORAL
  Filled 2018-03-24: qty 4

## 2018-03-24 MED ORDER — SODIUM CHLORIDE 0.9 % IV SOLN: 250.0000 mL | INTRAVENOUS | Status: DC | PRN

## 2018-03-24 MED ORDER — MORPHINE SULFATE (PF) 4 MG/ML IV SOLN
4.0000 mg | INTRAVENOUS | Status: DC | PRN
  Administered 2018-03-25 – 2018-03-26 (×6): 4 mg via INTRAVENOUS
  Filled 2018-03-24 (×6): qty 1

## 2018-03-24 MED ORDER — CLOPIDOGREL BISULFATE 75 MG PO TABS
75.0000 mg | ORAL_TABLET | Freq: Every day | ORAL | Status: DC
  Administered 2018-03-25 – 2018-03-26 (×2): 75 mg via ORAL
  Filled 2018-03-24 (×2): qty 1

## 2018-03-24 MED ORDER — NITROGLYCERIN 0.4 MG SL SUBL
0.4000 mg | SUBLINGUAL_TABLET | SUBLINGUAL | Status: AC | PRN
  Administered 2018-03-24 (×3): 0.4 mg via SUBLINGUAL
  Filled 2018-03-24: qty 1

## 2018-03-24 MED ORDER — ENOXAPARIN SODIUM 40 MG/0.4ML ~~LOC~~ SOLN
40.0000 mg | SUBCUTANEOUS | Status: DC
  Administered 2018-03-24 – 2018-03-25 (×2): 40 mg via SUBCUTANEOUS
  Filled 2018-03-24 (×2): qty 0.4

## 2018-03-24 MED ORDER — DIPHENHYDRAMINE HCL 25 MG PO CAPS: 25.0000 mg | ORAL_CAPSULE | Freq: Three times a day (TID) | ORAL | Status: DC | PRN

## 2018-03-24 MED ORDER — SENNOSIDES-DOCUSATE SODIUM 8.6-50 MG PO TABS: 1.0000 | ORAL_TABLET | Freq: Every evening | ORAL | Status: DC | PRN

## 2018-03-24 MED ORDER — DIPHENHYDRAMINE HCL 25 MG PO CAPS
25.0000 mg | ORAL_CAPSULE | Freq: Four times a day (QID) | ORAL | Status: DC | PRN
  Administered 2018-03-25 (×2): 25 mg via ORAL
  Filled 2018-03-24 (×2): qty 1

## 2018-03-24 MED ORDER — NITROGLYCERIN 2 % TD OINT
1.0000 [in_us] | TOPICAL_OINTMENT | Freq: Once | TRANSDERMAL | Status: AC
  Administered 2018-03-24: 1 [in_us] via TOPICAL
  Filled 2018-03-24: qty 1

## 2018-03-24 MED ORDER — TRAMADOL HCL 50 MG PO TABS
50.0000 mg | ORAL_TABLET | Freq: Four times a day (QID) | ORAL | Status: DC | PRN
  Administered 2018-03-25: 50 mg via ORAL
  Filled 2018-03-24: qty 1

## 2018-03-24 MED ORDER — HYDROCODONE-ACETAMINOPHEN 5-325 MG PO TABS
1.0000 | ORAL_TABLET | ORAL | Status: DC | PRN
  Filled 2018-03-24: qty 2

## 2018-03-24 MED ORDER — ACETAMINOPHEN 325 MG PO TABS
650.0000 mg | ORAL_TABLET | Freq: Four times a day (QID) | ORAL | Status: DC | PRN
  Filled 2018-03-24: qty 2

## 2018-03-24 MED ORDER — TRAZODONE HCL 50 MG PO TABS
50.0000 mg | ORAL_TABLET | Freq: Every evening | ORAL | Status: DC | PRN
  Administered 2018-03-25: 100 mg via ORAL
  Filled 2018-03-24: qty 2

## 2018-03-24 MED ORDER — FENTANYL CITRATE (PF) 100 MCG/2ML IJ SOLN: 50.0000 ug | INTRAMUSCULAR | Status: DC | PRN

## 2018-03-24 MED ORDER — AMLODIPINE BESYLATE 10 MG PO TABS
10.0000 mg | ORAL_TABLET | Freq: Every day | ORAL | Status: DC
  Administered 2018-03-25 – 2018-03-26 (×2): 10 mg via ORAL
  Filled 2018-03-24 (×2): qty 1

## 2018-03-24 NOTE — ED Notes (Signed)
Pt adamant about having the NTG paste removed from chest. NTG paste removed per pt request.

## 2018-03-24 NOTE — ED Triage Notes (Signed)
Chest pain. He felt like he had a virus when he woke this am. He had a syncopal event this am. He drove afterward and felt like he was going to pass out. He has a cardiac hx.

## 2018-03-24 NOTE — H&P (Signed)
History and Physical    [name] PJK:[id number] DOB: [birth year] DOA: 03/24/2018  PCP: [name], MD   Patient coming from: Home, by way of [facility]   Chief Complaint: Malaise, aches, transient LOC   HPI: [name] is a 62 y.o. male with medical history significant for CAD status post CABG, history of CVA, hypertension, and anxiety, who presents to the emergency department with a general malaise, aches, and transient loss of consciousness.  Patient reports he went to bed in his usual state but woke with generalized aches, most severe in the bilateral lower extremities, mild nausea, and lightheadedness.  He was walking down some steps in his house when he had a transient loss of awareness, finding himself sitting on the steps but on certain of what it just happened.  He denies any head injury and does not believe that he fell.  He went on about his usual activities, continued to have generalized aches, malaise, and lightheadedness, and eventually presented to the emergency department.  ED Course: Upon arrival to the ED, patient is found to be afebrile, saturating well on room air, and with vitals otherwise stable.  EKG features a sinus rhythm, chest x-ray is negative for acute cardiopulmonary disease, and chemistry panel and CBC are unremarkable.  Troponin is undetectable x2 almost 6 hours apart.  D-dimer is normal.  Patient went on to develop chest pain after a second transient loss of awareness while resting in the hospital bed.  Pain has been constant, moderate in intensity, localized to the central chest, and associated with mild nausea and malaise, but no shortness of breath or diaphoresis.  He was treated with 324 mg of aspirin, nitroglycerin, and morphine in the ED.  Cardiology was consulted by the ED physician and recommended medical admission.  Review of Systems:  All other systems reviewed and apart from HPI, are negative.  Past Medical History:  Diagnosis Date  . Anxiety   .  Basal cell carcinoma (BCC) of forehead   . CAD (coronary artery disease)    a. 10/2015 ant STEMI >> LHC with 3 v CAD; oLAD tx with POBA >> emergent CABG. b. Multiple evals since that time, early graft failure of SVG-RCA by cath 03/2016. c. 2/19 PCI/DES x1 to pRCA, normal EF.  Marland Kitchen Depression   . Dyspnea   . Ectopic atrial tachycardia ([place])   . Esophageal reflux    eosinophil esophagitis  . Family history of adverse reaction to anesthesia    "sister has PONV" (06/21/2017)  . Former tobacco use   . Gout   . Hepatitis C    "treated and cured" (06/21/2017)  . High cholesterol   . Hypertension   . Ischemic cardiomyopathy    a. EF 25-30% at intraop TEE 4/17  //  b. Limited Echo 5/17 - EF 45-50%, mild ant HK. c. EF 55-65% by cath 09/2017.  . Migraine    "3-4/yr" (06/21/2017)  . Myocardial infarction ([place]) 10/2015  . Sinus bradycardia    a. HR dropping into 40s in 02/2016 -> BB reduced.  . Stroke ([place]) 10/2016   "small one; sometimes my memory/cognitive issues" (06/21/2017)  . Symptomatic hypotension    a. 02/2016 ER visit -> meds reduced.    Past Surgical History:  Procedure Laterality Date  . BASAL CELL CARCINOMA EXCISION     "forehead  . CARDIAC CATHETERIZATION N/A 11/28/2015   Procedure: Left Heart Cath and Coronary Angiography;  Surgeon: [name], MD;  Location: [place] [facility];  Service: Cardiovascular;  Laterality: N/A;  . CARDIAC CATHETERIZATION N/A 11/28/2015   Procedure: Coronary Balloon Angioplasty;  Surgeon: [name], MD;  Location: [place] [facility];  Service: Cardiovascular;  Laterality: N/A;  ostial LAD  . CARDIAC CATHETERIZATION N/A 11/28/2015   Procedure: Coronary/Graft Angiography;  Surgeon: [name], MD;  Location: [place] [facility];  Service: Cardiovascular;  Laterality: N/A;  coronaries only   . CARDIAC CATHETERIZATION N/A 04/21/2016   Procedure: Left Heart Cath and Coronary Angiography;  Surgeon: Wellington Hampshire, MD;  Location: [place] [facility];  Service: Cardiovascular;  Laterality: N/A;  . CARDIAC CATHETERIZATION N/A 06/14/2016   Procedure: Left Heart Cath and Cors/Grafts Angiography;  Surgeon: Lorretta Harp, MD;  Location: [place] [facility];  Service: Cardiovascular;  Laterality: N/A;  . CARDIAC CATHETERIZATION N/A 09/08/2016   Procedure: Left Heart Cath and Cors/Grafts Angiography;  Surgeon: Wellington Hampshire, MD;  Location: [place] [facility];  Service: Cardiovascular;  Laterality: N/A;  . CARDIAC CATHETERIZATION    . CORONARY ARTERY BYPASS GRAFT N/A 11/28/2015   Procedure: CORONARY ARTERY BYPASS GRAFTING (CABG) TIMES FIVE USING LEFT INTERNAL MAMMARY ARTERY AND RIGHT GREATER SAPHENOUS,VIEN HARVEATED BY ENDOVIEN, INTRAOPPRATIVE TEE;  Surgeon: Gaye Pollack, MD;  Location: [place];  Service: Open Heart Surgery;  Laterality: N/A;  . CORONARY STENT INTERVENTION N/A 10/05/2017   Procedure: CORONARY STENT INTERVENTION;  Surgeon: [name], MD;  Location: [place] [facility];  Service: Cardiovascular;  Laterality: N/A;  . HUMERUS SURGERY Right 1969   "tumor inside bone; filled it w/bone chips"  . LEFT HEART CATH AND CORS/GRAFTS ANGIOGRAPHY N/A 03/11/2017   Procedure: Left Heart Cath and Cors/Grafts Angiography;  Surgeon: Leonie Man, MD;  Location: [place] [facility];  Service: Cardiovascular;  Laterality: N/A;  . LEFT HEART CATH AND CORS/GRAFTS ANGIOGRAPHY N/A 10/05/2017   Procedure: LEFT HEART CATH AND CORS/GRAFTS ANGIOGRAPHY;  Surgeon: [name], MD;  Location: [place] [facility];  Service: Cardiovascular;  Laterality: N/A;  . PERIPHERAL VASCULAR CATHETERIZATION N/A 06/14/2016   Procedure: Lower Extremity Angiography;  Surgeon: Lorretta Harp, MD;  Location: [place] [facility];  Service: Cardiovascular;  Laterality: N/A;     reports that he quit smoking about 2 years ago. His smoking use included cigarettes. He has a 33.00 pack-year smoking history. He has never used smokeless tobacco. He reports  that he does not drink alcohol or use drugs.  Allergies  Allergen Reactions  . Prednisone Other (See Comments)    States that this med makes him "crazy"  . Tetanus Toxoids Swelling and Other (See Comments)    Fever, Swelling of the arm   . Wellbutrin [Bupropion] Other (See Comments)    Crazy thoughts, nightmares  . Morphine And Related Hives and Itching    Redness at the injection site  . Chantix [Varenicline] Other (See Comments)    Dreams    Family History  Problem Relation Age of Onset  . Lung cancer Mother   . Heart Problems Father   . Heart attack Father 44  . Stroke Father   . Heart failure Father   . Heart attack Maternal Grandmother   . Stroke Maternal Grandmother   . Heart attack Paternal Uncle   . Hypertension Brother   . Autoimmune disease Neg Hx      Prior to Admission medications   Medication Sig Start Date End Date Taking? Authorizing Provider  acetaminophen (TYLENOL) 500 MG tablet Take 1,000 mg  by mouth every 6 (six) hours as needed for mild pain or headache.     [provider]  amLODipine (NORVASC) 10 MG tablet Take 1 tablet (10 mg total) by mouth daily. 06/22/17   Strader, Fransisco Hertz, PA-C  aspirin 81 MG tablet Take 81 mg by mouth daily.    [provider]  atorvastatin (LIPITOR) 80 MG tablet Take 1 tablet (80 mg total) by mouth daily. 08/16/17   [name], MD  chlordiazePOXIDE (LIBRIUM) 10 MG capsule Take 10 mg by mouth 4 (four) times daily as needed for anxiety.     [provider]  clopidogrel (PLAVIX) 75 MG tablet take 1 tablet by mouth once daily 01/25/17   [name] T, PA-C  fluticasone [facility]) 50 MCG/ACT nasal spray Place 1 spray into both nostrils as needed for allergies.  05/26/17   [provider]  lisinopril (PRINIVIL,ZESTRIL) 5 MG tablet Take 5 mg by mouth daily. May take 1 additional tablet once daily for SBP greater than 150 once hour after taking daily dose Patient taking differently: Take 5  mg by mouth daily. Take 5 mg by mouth daily. May take 1 additional tablet once daily for SBP greater than 150 once hour after taking daily dose 01/18/18   [name], MD  metoprolol succinate (TOPROL XL) 50 MG 24 hr tablet Take 1 tablet (50 mg total) by mouth as directed. 50 mg in the AM and 25 mg in the PM 03/08/18   [name] T, PA-C  Multiple Vitamins-Minerals (ONE-A-DAY MENS 50+ ADVANTAGE PO) Take 1 tablet by mouth daily.     [provider]  nitroGLYCERIN (NITROSTAT) 0.4 MG SL tablet Place 1 tablet (0.4 mg total) under the tongue every 5 (five) minutes as needed for chest pain. 3 DOSE MAX 06/22/17   Strader, [place] M, PA-C  pantoprazole (PROTONIX) 40 MG tablet Take 40 mg by mouth daily.    [provider]  ranolazine (RANEXA) 1000 MG SR tablet Take 1 tablet (1,000 mg total) by mouth 2 (two) times daily. 02/24/18   [name] T, PA-C  traZODone (DESYREL) 50 MG tablet Take 50-150 mg by mouth at bedtime as needed for sleep.  12/08/16   [provider]    Physical Exam: Vitals:   03/24/18 1900 03/24/18 1930 03/24/18 2000 03/24/18 2107  BP: 132/84 125/87 136/85 (!) 127/97  Pulse: 60 65 70 76  Resp: 18 16 17 18   Temp:    98.7 F (37.1 C)  TempSrc:    Oral  SpO2:  97% 96% 93%  Weight:    88 kg (194 lb 1.6 oz)  Height:    5\' 9"  (1.753 m)      Constitutional: NAD, calm  Eyes: PERTLA, lids and conjunctivae normal ENMT: Mucous membranes are moist. Posterior pharynx clear of any exudate or lesions.   Neck: normal, supple, no masses, no thyromegaly Respiratory: clear to auscultation bilaterally, no wheezing, no crackles. Normal respiratory effort.   Cardiovascular: S1 & S2 heard, regular rate and rhythm,. No extremity edema. No carotid bruits. No significant JVD. Abdomen: No distension, no tenderness, soft. Bowel sounds normal.  Musculoskeletal: no clubbing / cyanosis. No joint deformity upper and lower extremities.    Skin: no significant rashes,  lesions, ulcers. Warm, dry, well-perfused. Neurologic: CN 2-12 grossly intact. Sensation intact. Strength 5/5 in all 4 limbs.  Psychiatric: Alert and oriented x 3. Pleasant, cooperative.     Labs on Admission: I have personally reviewed following labs and  imaging studies  CBC: Recent Labs  Lab 03/24/18 1328  WBC 7.4  HGB 14.9  HCT 43.9  MCV 91.5  PLT 502   Basic Metabolic Panel: Recent Labs  Lab 03/24/18 1328  NA 139  K 4.4  CL 104  CO2 29  GLUCOSE 90  BUN 21  CREATININE 1.02  CALCIUM 9.4   GFR: Estimated Creatinine Clearance: 82.4 mL/min (by C-G formula based on SCr of 1.02 mg/dL). Liver Function Tests: No results for input(s): AST, ALT, ALKPHOS, BILITOT, PROT, ALBUMIN in the last 168 hours. No results for input(s): LIPASE, AMYLASE in the last 168 hours. No results for input(s): AMMONIA in the last 168 hours. Coagulation Profile: No results for input(s): INR, PROTIME in the last 168 hours. Cardiac Enzymes: Recent Labs  Lab 03/24/18 1328 03/24/18 1908  TROPONINI <0.03 <0.03   BNP (last 3 results) No results for input(s): PROBNP in the last 8760 hours. HbA1C: No results for input(s): HGBA1C in the last 72 hours. CBG: No results for input(s): GLUCAP in the last 168 hours. Lipid Profile: No results for input(s): CHOL, HDL, LDLCALC, TRIG, CHOLHDL, LDLDIRECT in the last 72 hours. Thyroid Function Tests: No results for input(s): TSH, T4TOTAL, FREET4, T3FREE, THYROIDAB in the last 72 hours. Anemia Panel: No results for input(s): VITAMINB12, FOLATE, FERRITIN, TIBC, IRON, RETICCTPCT in the last 72 hours. Urine analysis:    Component Value Date/Time   COLORURINE YELLOW 11/09/2016 1010   APPEARANCEUR CLEAR 11/09/2016 1010   LABSPEC 1.017 11/09/2016 1010   PHURINE 5.0 11/09/2016 1010   GLUCOSEU NEGATIVE 11/09/2016 1010   HGBUR NEGATIVE 11/09/2016 1010   BILIRUBINUR NEGATIVE 11/09/2016 1010   KETONESUR NEGATIVE 11/09/2016 1010   PROTEINUR NEGATIVE 11/09/2016  1010   UROBILINOGEN 0.2 05/13/2014 0658   NITRITE NEGATIVE 11/09/2016 1010   LEUKOCYTESUR NEGATIVE 11/09/2016 1010   Sepsis Labs: @LABRCNTIP (procalcitonin:4,lacticidven:4) )No results found for this or any previous visit (from the past 240 hour(s)).   Radiological Exams on Admission: Dg Chest 2 View  Result Date: 03/24/2018 CLINICAL DATA:  Chest pain.  Dizziness. EXAM: CHEST - 2 VIEW COMPARISON:  CT 02/09/2018. FINDINGS: Mediastinum hilar structures normal. Lungs are clear. No pleural effusion or pneumothorax. No acute cardiopulmonary disease. No acute bony abnormality. IMPRESSION: 1.  Prior CABG.  Heart size normal. 2.  No acute pulmonary disease. Electronically Signed   By: Marcello Moores  Register   On: 03/24/2018 13:02    EKG: Independently reviewed. Sinus rhythm.   Assessment/Plan   1. Chest pain; CAD  - Pt has hx of CAD s/p CABG, now reporting chest pain at rest that he noted after a brief transient loss of awareness and has persisted  - Troponin is undetectable x2 almost 6 hrs apart in ED, EKG without acute ischemic features, and CXR without acute abnormality  - He was treated with ASA 324 mg and NTG in ED and cardiology was consulted by ED physician  - Continue cardiac monitoring, check a third troponin, echocardiogram given associated syncope, continue DAPT, Lipitor, lisinopril, and metoprolol    2. Syncope  - Reports a transient loss of awareness on morning of admission, and a second while in bed in the ED  - He reports nausea and general malaise, but also developed chest pain with the second episode  - Check orthostatic vitals, continue cardiac monitoring, check echocardiogram    3. Hypertension  - BP at goal  - Continue Norvasc, lisinopril, and Toprol    4. Hx of CVA  - No acute focal deficits  -  Continue ASA, Plavix, and Lipitor     DVT prophylaxis: Lovenox Code Status: Full  Family Communication: Discussed with patient  Consults called: Cardiology Admission status:  Inpatient     [name], MD [facility] Pager [phone]  If 7PM-7AM, please contact night-coverage [email or web address] Password [facility]  03/24/2018, 10:48 PM

## 2018-03-24 NOTE — Progress Notes (Signed)
6E24, pt refused Norco, wants Morphine despite itch for CP 4/10.  Asking for something for itch

## 2018-03-24 NOTE — Progress Notes (Signed)
Messaged MD, pt prefers Morphine over Norco on Mar, despite itch Morphine gives him.

## 2018-03-24 NOTE — ED Notes (Signed)
Floor stated they could not take the pt due to active CP. EDP notfied. See new orders.

## 2018-03-24 NOTE — Plan of Care (Signed)
62 year old male WITH history  of hypertension , CAD , CABG , stent being transferred from [place] to [facility] with chest pain and syncope.  Cardiac enzymes negative EKG no acute changes.  Labs normal.  Vital signs stable.  Chest x-ray normal.  He is coming for syncope work-up.  He was given aspirin and nitroglycerin tablets.  Cardiology consult to Dr. [name]  was called by EDP.

## 2018-03-24 NOTE — ED Provider Notes (Signed)
[facility] EMERGENCY DEPARTMENT Provider Note   CSN: [id number] Arrival date & time: 03/24/18  1234     History   Chief Complaint Chief Complaint  Patient presents with  . Chest Pain    HPI [name] is a 62 y.o. male.  62 year old male with past medical history including CAD s/p CABG and stenting, CVA, ischemic cardiomyopathy, HTN, eosinophilic esophagitis who p/w chest pain.  Patient woke up this morning feeling like he was coming down with a virus, just generally not feeling great.  He got up and was getting ready for work and states that he was standing at the top of his steps and the next thing he knew he was sitting on the steps.  He thinks that he may have passed out for a brief period, did not fall down the stairs or hit his head.  He continued on to work and while there began having lightheadedness associated with nausea and then central chest pain that he is not able to characterize but states it is intermittent, nonradiating, and associated with shortness of breath.  He wanted to drive himself here but someone else drove him because he felt near syncopal.  He currently is having moderate chest pain. He states pain feels somewhat similar to previous MI. No recent travel, history of blood clots, leg swelling, history of cancer, tobacco use, or drug use.  The history is provided by the patient.    Past Medical History:  Diagnosis Date  . Anxiety   . Basal cell carcinoma (BCC) of forehead   . CAD (coronary artery disease)    a. 10/2015 ant STEMI >> LHC with 3 v CAD; oLAD tx with POBA >> emergent CABG. b. Multiple evals since that time, early graft failure of SVG-RCA by cath 03/2016. c. 2/19 PCI/DES x1 to pRCA, normal EF.  Marland Kitchen Depression   . Dyspnea   . Ectopic atrial tachycardia ([place])   . Esophageal reflux    eosinophil esophagitis  . Family history of adverse reaction to anesthesia    "sister has PONV" (06/21/2017)  . Former tobacco use   . Gout   .  Hepatitis C    "treated and cured" (06/21/2017)  . High cholesterol   . Hypertension   . Ischemic cardiomyopathy    a. EF 25-30% at intraop TEE 4/17  //  b. Limited Echo 5/17 - EF 45-50%, mild ant HK. c. EF 55-65% by cath 09/2017.  . Migraine    "3-4/yr" (06/21/2017)  . Myocardial infarction ([place]) 10/2015  . Sinus bradycardia    a. HR dropping into 40s in 02/2016 -> BB reduced.  . Stroke ([place]) 10/2016   "small one; sometimes my memory/cognitive issues" (06/21/2017)  . Symptomatic hypotension    a. 02/2016 ER visit -> meds reduced.    Patient Active Problem List   Diagnosis Date Noted  . Ectopic atrial tachycardia ([place]) 02/09/2018  . Chest pain 03/10/2017  . Family hx-stroke 11/10/2016  . Stroke-like episode ([place]) - R brain, s/p tPA 11/09/2016  . Unstable angina ([place]) 09/07/2016  . Claudication of both lower extremities ([place])   . Pure hypercholesterolemia   . Tobacco abuse disorder   . Coronary artery disease involving coronary bypass graft of native heart with unstable angina pectoris ([place])   . Chest pain at rest 06/10/2016  . Abnormal nuclear stress test - HIGH RISK 04/20/2016  . Old MI (myocardial infarction)   . Essential hypertension 02/26/2016  . Ischemic cardiomyopathy 12/25/2015  . Hyperlipidemia  LDL goal <70 12/25/2015  . STEMI (ST elevation myocardial infarction) ([place]) 11/28/2015  . 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 Coronary artery disease involving native coronary artery of native heart with unstable angina pectoris ([place]) 11/28/2015  . 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 Acute MI anterior wall first episode care [facility])   . Chest pain with high risk for cardiac etiology 03/07/2015  . Dysphagia 03/07/2015  . Gout attack 03/07/2015  . Mixed bipolar I disorder ([place]) 03/07/2015  . Fibromyalgia 07/09/2014  . Gout 07/09/2014  . Anxiety 07/09/2014  . Depression 07/09/2014  . Hepatitis C 11/20/2012  . Eosinophilic esophagitis 17/49/4496    Past Surgical  History:  Procedure Laterality Date  . BASAL CELL CARCINOMA EXCISION     "forehead  . CARDIAC CATHETERIZATION N/A 11/28/2015   Procedure: Left Heart Cath and Coronary Angiography;  Surgeon: [name], MD;  Location: [facility] CV LAB;  Service: Cardiovascular;  Laterality: N/A;  . CARDIAC CATHETERIZATION N/A 11/28/2015   Procedure: Coronary Balloon Angioplasty;  Surgeon: [name], MD;  Location: [facility];  Service: Cardiovascular;  Laterality: N/A;  ostial LAD  . CARDIAC CATHETERIZATION N/A 11/28/2015   Procedure: Coronary/Graft Angiography;  Surgeon: [name], MD;  Location: [facility];  Service: Cardiovascular;  Laterality: N/A;  coronaries only   . CARDIAC CATHETERIZATION N/A 04/21/2016   Procedure: Left Heart Cath and Coronary Angiography;  Surgeon: Wellington Hampshire, MD;  Location: [facility];  Service: Cardiovascular;  Laterality: N/A;  . CARDIAC CATHETERIZATION N/A 06/14/2016   Procedure: Left Heart Cath and Cors/Grafts Angiography;  Surgeon: Lorretta Harp, MD;  Location: [facility] LAB;  Service: Cardiovascular;  Laterality: N/A;  . CARDIAC CATHETERIZATION N/A 09/08/2016   Procedure: Left Heart Cath and Cors/Grafts Angiography;  Surgeon: Wellington Hampshire, MD;  Location: [facility] LAB;  Service: Cardiovascular;  Laterality: N/A;  . CARDIAC CATHETERIZATION    . CORONARY ARTERY BYPASS GRAFT N/A 11/28/2015   Procedure: CORONARY ARTERY BYPASS GRAFTING (CABG) TIMES FIVE USING LEFT INTERNAL MAMMARY ARTERY AND RIGHT GREATER SAPHENOUS,VIEN HARVEATED BY ENDOVIEN, INTRAOPPRATIVE TEE;  Surgeon: Gaye Pollack, MD;  Location: [facility];  Service: Open Heart Surgery;  Laterality: N/A;  . CORONARY STENT INTERVENTION N/A 10/05/2017   Procedure: CORONARY STENT INTERVENTION;  Surgeon: [name], MD;  Location: [facility];  Service: Cardiovascular;  Laterality: N/A;  . HUMERUS SURGERY Right 1969   "tumor inside bone; filled it  w/bone chips"  . LEFT HEART CATH AND CORS/GRAFTS ANGIOGRAPHY N/A 03/11/2017   Procedure: Left Heart Cath and Cors/Grafts Angiography;  Surgeon: Leonie Man, MD;  Location: [facility] CV LAB;  Service: Cardiovascular;  Laterality: N/A;  . LEFT HEART CATH AND CORS/GRAFTS ANGIOGRAPHY N/A 10/05/2017   Procedure: LEFT HEART CATH AND CORS/GRAFTS ANGIOGRAPHY;  Surgeon: [name], MD;  Location: [facility] CV LAB;  Service: Cardiovascular;  Laterality: N/A;  . PERIPHERAL VASCULAR CATHETERIZATION N/A 06/14/2016   Procedure: Lower Extremity Angiography;  Surgeon: Lorretta Harp, MD;  Location: [facility];  Service: Cardiovascular;  Laterality: N/A;        Home Medications    Prior to Admission medications   Medication Sig Start Date End Date Taking? Authorizing Provider  acetaminophen (TYLENOL) 500 MG tablet Take 1,000 mg by mouth every 6 (six) hours as needed for mild pain or headache.     [provider]  amLODipine (NORVASC) 10  MG tablet Take 1 tablet (10 mg total) by mouth daily. 06/22/17   Strader, Fransisco Hertz, PA-C  aspirin 81 MG tablet Take 81 mg by mouth daily.    [provider]  atorvastatin (LIPITOR) 80 MG tablet Take 1 tablet (80 mg total) by mouth daily. 08/16/17   [name], MD  chlordiazePOXIDE (LIBRIUM) 10 MG capsule Take 10 mg by mouth 4 (four) times daily as needed for anxiety.     [provider]  clopidogrel (PLAVIX) 75 MG tablet take 1 tablet by mouth once daily 01/25/17   [name] T, PA-C  fluticasone [facility]) 50 MCG/ACT nasal spray Place 1 spray into both nostrils as needed for allergies.  05/26/17   [provider]  lisinopril (PRINIVIL,ZESTRIL) 5 MG tablet Take 5 mg by mouth daily. May take 1 additional tablet once daily for SBP greater than 150 once hour after taking daily dose Patient taking differently: Take 5 mg by mouth daily. Take 5 mg by mouth daily. May take 1 additional tablet once daily for SBP  greater than 150 once hour after taking daily dose 01/18/18   [name], MD  metoprolol succinate (TOPROL XL) 50 MG 24 hr tablet Take 1 tablet (50 mg total) by mouth as directed. 50 mg in the AM and 25 mg in the PM 03/08/18   [name] T, PA-C  Multiple Vitamins-Minerals (ONE-A-DAY MENS 50+ ADVANTAGE PO) Take 1 tablet by mouth daily.     [provider]  nitroGLYCERIN (NITROSTAT) 0.4 MG SL tablet Place 1 tablet (0.4 mg total) under the tongue every 5 (five) minutes as needed for chest pain. 3 DOSE MAX 06/22/17   Strader, [place] M, PA-C  pantoprazole (PROTONIX) 40 MG tablet Take 40 mg by mouth daily.    [provider]  ranolazine (RANEXA) 1000 MG SR tablet Take 1 tablet (1,000 mg total) by mouth 2 (two) times daily. 02/24/18   [name] T, PA-C  traZODone (DESYREL) 50 MG tablet Take 50-150 mg by mouth at bedtime as needed for sleep.  12/08/16   [provider]    Family History Family History  Problem Relation Age of Onset  . Lung cancer Mother   . Heart Problems Father   . Heart attack Father 17  . Stroke Father   . Heart failure Father   . Heart attack Maternal Grandmother   . Stroke Maternal Grandmother   . Heart attack Paternal Uncle   . Hypertension Brother   . Autoimmune disease Neg Hx     Social History Social History   Tobacco Use  . Smoking status: Former Smoker    Packs/day: 0.75    Years: 44.00    Pack years: 33.00    Types: Cigarettes    Last attempt to quit: 11/28/2015    Years since quitting: 2.3  . Smokeless tobacco: Never Used  Substance Use Topics  . Alcohol use: No    Frequency: Never    Comment: 06/21/2017 "I'll have a few drinks q couple months"  . Drug use: No    Comment: 06/21/2017 "nothing since the 1980s"     Allergies   Prednisone; Tetanus toxoids; Wellbutrin [bupropion]; Morphine and related; and Chantix [varenicline]   Review of Systems Review of Systems All other systems reviewed and are negative  except that which was mentioned in HPI   Physical Exam Updated Vital Signs BP 116/70   Pulse 70   Temp 98.2 F (36.8 C) (Oral)   Resp 15  Ht 5\' 9"  (1.753 m)   Wt 87.1 kg (192 lb)   SpO2 99%   BMI 28.35 kg/m   Physical Exam  Constitutional: He is oriented to person, place, and time. He appears well-developed and well-nourished. No distress.  HENT:  Head: Normocephalic and atraumatic.  Moist mucous membranes  Eyes: Pupils are equal, round, and reactive to light. Conjunctivae are normal.  Neck: Neck supple.  Cardiovascular: Normal rate, regular rhythm and normal heart sounds.  No murmur heard. Pulmonary/Chest: Effort normal and breath sounds normal.  Abdominal: Soft. Bowel sounds are normal. He exhibits no distension. There is no tenderness.  Musculoskeletal: He exhibits no edema.  Neurological: He is alert and oriented to person, place, and time. No cranial nerve deficit.  Fluent speech 5/5 strength x all 4 ext, normal sensation  Skin: Skin is warm and dry.  Psychiatric: Judgment normal.  Bizarre affect  Nursing note and vitals reviewed.    ED Treatments / Results  Labs (all labs ordered are listed, but only abnormal results are displayed) Labs Reviewed  BASIC METABOLIC PANEL  CBC  TROPONIN I  D-DIMER, QUANTITATIVE (NOT AT [facility])    EKG EKG Interpretation  Date/Time:  Friday March 24 2018 12:42:08 EDT Ventricular Rate:  79 PR Interval:    QRS Duration: 96 QT Interval:  373 QTC Calculation: 428 R Axis:   82 Text Interpretation:  Sinus rhythm Borderline right axis deviation similar to previous Confirmed by Theotis Burrow [id number]) on 03/24/2018 12:50:22 PM   Radiology Dg Chest 2 View  Result Date: 03/24/2018 CLINICAL DATA:  Chest pain.  Dizziness. EXAM: CHEST - 2 VIEW COMPARISON:  CT 02/09/2018. FINDINGS: Mediastinum hilar structures normal. Lungs are clear. No pleural effusion or pneumothorax. No acute cardiopulmonary disease. No acute bony abnormality.  IMPRESSION: 1.  Prior CABG.  Heart size normal. 2.  No acute pulmonary disease. Electronically Signed   By: Marcello Moores  Register   On: 03/24/2018 13:02    Procedures Procedures (including critical care time)  Medications Ordered in ED Medications  aspirin chewable tablet 324 mg (324 mg Oral Given 03/24/18 1403)  nitroGLYCERIN (NITROSTAT) SL tablet 0.4 mg (0.4 mg Sublingual Given 03/24/18 1424)  ondansetron (ZOFRAN) injection 4 mg (4 mg Intravenous Given 03/24/18 1403)     Initial Impression / Assessment and Plan / ED Course  I have reviewed the triage vital signs and the nursing notes.  Pertinent labs & imaging results that were available during my care of the patient were reviewed by me and considered in my medical decision making (see chart for details).     Well appearing on exam, VSS. EKG without acute ischemic changes. Given aspirin, NTG which improved pain. Labs show neg trop, neg d-dimer, BMP and CBC normal. CXR negative acute. Because of CP and syncopal event in setting of extensive cardiac hx, discussed w/ cardiology, Dr. [name], who agreed with plan to admit to medicine w/ cardiology consult. Discussed w/ hospitalist at [facility], Dr. Rodena Piety, and patient will be transferred for further evaluation.  Final Clinical Impressions(s) / ED Diagnoses   Final diagnoses:  None    ED Discharge Orders    None       Sharief Wainwright, Wenda Overland, MD 03/24/18 1540

## 2018-03-24 NOTE — ED Notes (Addendum)
Pt is requesting to have the NTG patch taken off. States it is making him feel "weird" like the tablets. States he is getting a HA and is asking for nausea medication. EDP notified. See new orders.

## 2018-03-24 NOTE — Progress Notes (Addendum)
6e 24 pt arrived from [facility], CP 2/10, c/o slight yet tolerableitch no med on [facility] for pruritis late effect of Morphine IV.

## 2018-03-24 NOTE — ED Notes (Signed)
Pt reports pain in his chest is 4/10, mid sternal,"burning" sensation. Denies any other c/o.

## 2018-03-24 NOTE — Progress Notes (Signed)
   03/24/18 2343  Orthostatic Lying   BP- Lying 130/77  Pulse- Lying 70  Orthostatic Sitting  BP- Sitting 133/83  Pulse- Sitting 70  Orthostatic Standing at 0 minutes  BP- Standing at 0 minutes 108/78  Pulse- Standing at 0 minutes 89  Orthostatic Standing at 3 minutes  BP- Standing at 3 minutes 114/85  Pulse- Standing at 3 minutes 101

## 2018-03-24 NOTE — ED Notes (Signed)
Confirmed that previous RN and EDP had the discussion about the Morphine allergy listed on his chart and that pt and EDP were okay with administration.

## 2018-03-25 ENCOUNTER — Other Ambulatory Visit: Payer: Self-pay | Admitting: Physician Assistant

## 2018-03-25 ENCOUNTER — Inpatient Hospital Stay (HOSPITAL_COMMUNITY): Payer: BC Managed Care – PPO

## 2018-03-25 DIAGNOSIS — R55 Syncope and collapse: Secondary | ICD-10-CM

## 2018-03-25 DIAGNOSIS — E785 Hyperlipidemia, unspecified: Secondary | ICD-10-CM

## 2018-03-25 DIAGNOSIS — R079 Chest pain, unspecified: Secondary | ICD-10-CM

## 2018-03-25 DIAGNOSIS — R072 Precordial pain: Secondary | ICD-10-CM

## 2018-03-25 DIAGNOSIS — F419 Anxiety disorder, unspecified: Secondary | ICD-10-CM

## 2018-03-25 DIAGNOSIS — I10 Essential (primary) hypertension: Secondary | ICD-10-CM

## 2018-03-25 DIAGNOSIS — I25110 Atherosclerotic heart disease of native coronary artery with unstable angina pectoris: Principal | ICD-10-CM

## 2018-03-25 DIAGNOSIS — Z951 Presence of aortocoronary bypass graft: Secondary | ICD-10-CM

## 2018-03-25 LAB — ECHOCARDIOGRAM COMPLETE
Height: 69 in
Weight: 3064 oz

## 2018-03-25 LAB — BASIC METABOLIC PANEL
Anion gap: 8 (ref 5–15)
BUN: 16 mg/dL (ref 8–23)
CO2: 27 mmol/L (ref 22–32)
Calcium: 9.1 mg/dL (ref 8.9–10.3)
Chloride: 104 mmol/L (ref 98–111)
Creatinine, Ser: 1.16 mg/dL (ref 0.61–1.24)
GFR calc Af Amer: >60 mL/min (ref >60)
GFR calc non Af Amer: >60 mL/min (ref >60)
Glucose, Bld: 103 mg/dL — ABNORMAL HIGH (ref 70–99)
Potassium: 3.8 mmol/L (ref 3.5–5.1)
Sodium: 139 mmol/L (ref 135–145)

## 2018-03-25 LAB — CK: Total CK: 63 U/L (ref 49–397)

## 2018-03-25 LAB — TROPONIN I: Troponin I: <0.03 ng/mL (ref <0.03)

## 2018-03-25 MED ORDER — TERBINAFINE HCL 250 MG PO TABS
250.0000 mg | ORAL_TABLET | Freq: Every day | ORAL | Status: DC
Start: 2018-03-25 — End: 2018-03-26
  Administered 2018-03-26: 250 mg via ORAL
  Filled 2018-03-25 (×2): qty 1

## 2018-03-25 MED ORDER — SODIUM CHLORIDE 0.9 % IV SOLN
INTRAVENOUS | Status: DC
  Administered 2018-03-25 (×2): via INTRAVENOUS

## 2018-03-25 MED ORDER — GRISEOFULVIN MICROSIZE 500 MG PO TABS
500.0000 mg | ORAL_TABLET | Freq: Every day | ORAL | Status: DC
  Filled 2018-03-25: qty 1

## 2018-03-25 MED ORDER — TRAZODONE HCL 50 MG PO TABS: 50.0000 mg | ORAL_TABLET | Freq: Every evening | ORAL | Status: DC | PRN

## 2018-03-25 MED ORDER — ITRACONAZOLE 100 MG PO CAPS: 200.0000 mg | ORAL_CAPSULE | Freq: Every day | ORAL | Status: DC

## 2018-03-25 MED ORDER — TERBINAFINE HCL 1 % EX CREA
TOPICAL_CREAM | Freq: Two times a day (BID) | CUTANEOUS | Status: DC
  Administered 2018-03-25: 1 via TOPICAL
  Administered 2018-03-26: 09:00:00 via TOPICAL
  Filled 2018-03-25: qty 12

## 2018-03-25 NOTE — Consult Note (Addendum)
Cardiology Consultation:   Patient ID: [name]; [id number]; [birth year]   Admit date: 03/24/2018 Date of Consult: 03/25/2018  Primary Care Provider: [name], MD Primary Cardiologist: Larae Grooms, MD  Primary Electrophysiologist:     Patient Profile:   [name] is a 62 y.o. male with a hx of CAD s/p emergent CABG 2017, last cath 10/05/17 with DES to proximal RCA, hx of CVA, HTN, and anxiety who is being seen today for the evaluation of syncope at the request of Dr. Roger Shelter.  History of Present Illness:   [name] has a history of CAD with MI in 10/2015 treated with emergent CABG, last cath 10/05/17 with DES to proximal RCA, systolic heart failure secondary to ischemic cardiomyapathy, prior CVA treated with TPA 10/2016. Echo 12/2015 with improvement of EF.    He was recently hospitalized for chest pain and palpitations and found to have ectopic atrial tachycardia. Beta blocker was titrated. D-dimer was negative. Due to continued complaints of chest pain, CT angio performed and was negative for aortic syndrome. Chest pain was thought to be related to his thoracic spine disease with referred pain. He was discharged 02/09/18 with tylenol.   He saw [name] in clinic on 02/24/18. At that time, he complained of "heavy heartbeat" and palpitations at night. He did not have exertional symptoms, but complained of chest discomfort. Anti-angninal therapy was adjusted with hopes to defer repeat stress testing vs heart cath. Ranexa and lopressor were both increased at that visit.   He presented to the [name] after an episode of malaise, body aches, and transient LOC. On my interview, the history of events is difficult to gather. He states that yesterday he woke up feeling sick with body aches and his legs keep hurting. He was walking downstairs, but suddenly felt "differently" and sat on the stairs. He has no recollection of sitting on the stairs. Later, he felt chest pain, which  prompted him to come to the ER. Upon check in, he does not remember triage asking him questions and then became aware of "three women doing a neuro exam." He was admitted to medicine service. He had second episode of what sounds like near syncope last night during a blood draw. He continues to be neurologically intact.  He states that he developed recurrence of chest pain after echocardiogram today and it has never resolved. Chest pain feels like his normal chest pain, and has been constant for several hours.   It is difficult to ascertain what he is most concerned about. He complains of leg pain, near syncope, and chest pain. I believe he thinks his chest pain is most worrisome.    Troponin x 3 negative. EKG nonacute. Orthostatic vitals were positive for hypotension.   Past Medical History:  Diagnosis Date  . Anxiety   . Basal cell carcinoma (BCC) of forehead   . CAD (coronary artery disease)    a. 10/2015 ant STEMI >> LHC with 3 v CAD; oLAD tx with POBA >> emergent CABG. b. Multiple evals since that time, early graft failure of SVG-RCA by cath 03/2016. c. 2/19 PCI/DES x1 to pRCA, normal EF.  Marland Kitchen Depression   . Dyspnea   . Ectopic atrial tachycardia ([place])   . Esophageal reflux    eosinophil esophagitis  . Family history of adverse reaction to anesthesia    "sister has PONV" (06/21/2017)  . Former tobacco use   . Gout   . Hepatitis C    "treated and cured" (06/21/2017)  .  High cholesterol   . Hypertension   . Ischemic cardiomyopathy    a. EF 25-30% at intraop TEE 4/17  //  b. Limited Echo 5/17 - EF 45-50%, mild ant HK. c. EF 55-65% by cath 09/2017.  . Migraine    "3-4/yr" (06/21/2017)  . Myocardial infarction ([place]) 10/2015  . Sinus bradycardia    a. HR dropping into 40s in 02/2016 -> BB reduced.  . Stroke ([place]) 10/2016   "small one; sometimes my memory/cognitive issues" (06/21/2017)  . Symptomatic hypotension    a. 02/2016 ER visit -> meds reduced.    Past Surgical History:    Procedure Laterality Date  . BASAL CELL CARCINOMA EXCISION     "forehead  . CARDIAC CATHETERIZATION N/A 11/28/2015   Procedure: Left Heart Cath and Coronary Angiography;  Surgeon: [name], MD;  Location: [place] [facility];  Service: Cardiovascular;  Laterality: N/A;  . CARDIAC CATHETERIZATION N/A 11/28/2015   Procedure: Coronary Balloon Angioplasty;  Surgeon: [name], MD;  Location: [place] [facility];  Service: Cardiovascular;  Laterality: N/A;  ostial LAD  . CARDIAC CATHETERIZATION N/A 11/28/2015   Procedure: Coronary/Graft Angiography;  Surgeon: [name], MD;  Location: [place] [facility];  Service: Cardiovascular;  Laterality: N/A;  coronaries only   . CARDIAC CATHETERIZATION N/A 04/21/2016   Procedure: Left Heart Cath and Coronary Angiography;  Surgeon: Wellington Hampshire, MD;  Location: [place] [facility];  Service: Cardiovascular;  Laterality: N/A;  . CARDIAC CATHETERIZATION N/A 06/14/2016   Procedure: Left Heart Cath and Cors/Grafts Angiography;  Surgeon: Lorretta Harp, MD;  Location: [place] [facility];  Service: Cardiovascular;  Laterality: N/A;  . CARDIAC CATHETERIZATION N/A 09/08/2016   Procedure: Left Heart Cath and Cors/Grafts Angiography;  Surgeon: Wellington Hampshire, MD;  Location: [place] [facility];  Service: Cardiovascular;  Laterality: N/A;  . CARDIAC CATHETERIZATION    . CORONARY ARTERY BYPASS GRAFT N/A 11/28/2015   Procedure: CORONARY ARTERY BYPASS GRAFTING (CABG) TIMES FIVE USING LEFT INTERNAL MAMMARY ARTERY AND RIGHT GREATER SAPHENOUS,VIEN HARVEATED BY ENDOVIEN, INTRAOPPRATIVE TEE;  Surgeon: Gaye Pollack, MD;  Location: [place];  Service: Open Heart Surgery;  Laterality: N/A;  . CORONARY STENT INTERVENTION N/A 10/05/2017   Procedure: CORONARY STENT INTERVENTION;  Surgeon: [name], MD;  Location: [place] [facility];  Service: Cardiovascular;  Laterality: N/A;  . HUMERUS SURGERY Right 1969   "tumor inside bone; filled it w/bone  chips"  . LEFT HEART CATH AND CORS/GRAFTS ANGIOGRAPHY N/A 03/11/2017   Procedure: Left Heart Cath and Cors/Grafts Angiography;  Surgeon: Leonie Man, MD;  Location: [place] [facility];  Service: Cardiovascular;  Laterality: N/A;  . LEFT HEART CATH AND CORS/GRAFTS ANGIOGRAPHY N/A 10/05/2017   Procedure: LEFT HEART CATH AND CORS/GRAFTS ANGIOGRAPHY;  Surgeon: [name], MD;  Location: [place] [facility];  Service: Cardiovascular;  Laterality: N/A;  . PERIPHERAL VASCULAR CATHETERIZATION N/A 06/14/2016   Procedure: Lower Extremity Angiography;  Surgeon: Lorretta Harp, MD;  Location: [place] [facility];  Service: Cardiovascular;  Laterality: N/A;     Home Medications:  Prior to Admission medications   Medication Sig Start Date End Date Taking? Authorizing Provider  acetaminophen (TYLENOL) 500 MG tablet Take 1,000 mg by mouth every 6 (six) hours as needed for mild pain or headache.    Yes [provider]  amLODipine (NORVASC) 10 MG tablet Take 1 tablet (10 mg total) by mouth daily. 06/22/17  Yes Erskine, Fransisco Hertz,  PA-C  aspirin 81 MG tablet Take 81 mg by mouth daily.   Yes [provider]  atorvastatin (LIPITOR) 80 MG tablet Take 1 tablet (80 mg total) by mouth daily. 08/16/17  Yes [name], MD  chlordiazePOXIDE (LIBRIUM) 10 MG capsule Take 10 mg by mouth 4 (four) times daily as needed for anxiety.    Yes [provider]  clopidogrel (PLAVIX) 75 MG tablet take 1 tablet by mouth once daily 01/25/17  Yes Weaver, Scott T, PA-C  fluticasone (FLONASE) 50 MCG/ACT nasal spray Place 1 spray into both nostrils as needed for allergies.  05/26/17  Yes [provider]  lisinopril (PRINIVIL,ZESTRIL) 5 MG tablet Take 5 mg by mouth daily. May take 1 additional tablet once daily for SBP greater than 150 once hour after taking daily dose Patient taking differently: Take 5 mg by mouth daily. Take 5 mg by mouth daily. May take 1 additional tablet once daily  for SBP greater than 150 once hour after taking daily dose 01/18/18  Yes [name], MD  metoprolol succinate (TOPROL XL) 50 MG 24 hr tablet Take 1 tablet (50 mg total) by mouth as directed. 50 mg in the AM and 25 mg in the PM 03/08/18  Yes Weaver, Nicki Reaper T, PA-C  Multiple Vitamins-Minerals (ONE-A-DAY MENS 50+ ADVANTAGE PO) Take 1 tablet by mouth daily.    Yes [provider]  nitroGLYCERIN (NITROSTAT) 0.4 MG SL tablet Place 1 tablet (0.4 mg total) under the tongue every 5 (five) minutes as needed for chest pain. 3 DOSE MAX 06/22/17  Yes Strader, [place] M, PA-C  pantoprazole (PROTONIX) 40 MG tablet Take 40 mg by mouth daily.   Yes [provider]  ranitidine (ZANTAC) 150 MG tablet Take 150 mg by mouth as needed for heartburn.   Yes [provider]  ranolazine (RANEXA) 1000 MG SR tablet Take 1 tablet (1,000 mg total) by mouth 2 (two) times daily. 02/24/18  Yes Weaver, Scott T, PA-C  traZODone (DESYREL) 50 MG tablet Take 50-150 mg by mouth at bedtime as needed for sleep.  12/08/16  Yes [provider]    Inpatient Medications: Scheduled Meds: . amLODipine  10 mg Oral Daily  . aspirin  81 mg Oral Daily  . atorvastatin  80 mg Oral q1800  . clopidogrel  75 mg Oral Daily  . enoxaparin (LOVENOX) injection  40 mg Subcutaneous Q24H  . lisinopril  5 mg Oral Daily  . metoprolol succinate  25 mg Oral QHS  . metoprolol succinate  50 mg Oral Daily  . multivitamin with minerals  1 tablet Oral Daily  . pantoprazole  40 mg Oral Daily  . ranolazine  1,000 mg Oral BID  . sodium chloride flush  3 mL Intravenous Q12H  . sodium chloride flush  3 mL Intravenous Q12H   Continuous Infusions: . sodium chloride    . sodium chloride 100 mL/hr at 03/25/18 1123   PRN Meds: sodium chloride, acetaminophen **OR** acetaminophen, bisacodyl, chlordiazePOXIDE, diphenhydrAMINE, morphine injection, senna-docusate, sodium chloride flush, traMADol, traZODone  Allergies:      Allergies  Allergen Reactions  . Prednisone Other (See Comments)    States that this med makes him "crazy"  . Tetanus Toxoids Swelling and Other (See Comments)    Fever, Swelling of the arm   . Wellbutrin [Bupropion] Other (See Comments)    Crazy thoughts, nightmares  . Morphine And Related Hives and Itching    Redness at the injection site  . [employer]  Allergy] Nausea Only  . Chantix [Varenicline] Other (See Comments)    Dreams    Social History:   Social History   Socioeconomic History  . Marital status: Married    Spouse name: [name]  . Number of children: 3  . Years of education: College  . Highest education level: Not on file  Occupational History  . Occupation: Scientist, research (physical sciences): SELF-EMPLOYED  Social Needs  . Financial resource strain: Not on file  . Food insecurity:    Worry: Not on file    Inability: Not on file  . Transportation needs:    Medical: Not on file    Non-medical: Not on file  Tobacco Use  . Smoking status: Former Smoker    Packs/day: 0.75    Years: 44.00    Pack years: 33.00    Types: Cigarettes    Last attempt to quit: 11/28/2015    Years since quitting: 2.3  . Smokeless tobacco: Never Used  Substance and Sexual Activity  . Alcohol use: No    Frequency: Never    Comment: 06/21/2017 "I'll have a few drinks q couple months"  . Drug use: No    Comment: 06/21/2017 "nothing since the 1980s"  . Sexual activity: Yes  Lifestyle  . Physical activity:    Days per week: Not on file    Minutes per session: Not on file  . Stress: Not on file  Relationships  . Social connections:    Talks on phone: Not on file    Gets together: Not on file    Attends religious service: Not on file    Active member of club or organization: Not on file    Attends meetings of clubs or organizations: Not on file    Relationship status: Not on file  . Intimate partner violence:    Fear of current or ex partner: Not on file    Emotionally abused:  Not on file    Physically abused: Not on file    Forced sexual activity: Not on file  Other Topics Concern  . Not on file  Social History Narrative   Patient lives at home with his spouse.   Caffeine Use: yes    Family History:    Family History  Problem Relation Age of Onset  . Lung cancer Mother   . Heart Problems Father   . Heart attack Father 33  . Stroke Father   . Heart failure Father   . Heart attack Maternal Grandmother   . Stroke Maternal Grandmother   . Heart attack Paternal Uncle   . Hypertension Brother   . Autoimmune disease Neg Hx      ROS:  Please see the history of present illness.   All other ROS reviewed and negative.     Physical Exam/Data:   Vitals:   03/25/18 0101 03/25/18 0200 03/25/18 0546 03/25/18 1133  BP:   114/80 126/83  Pulse: 73 61 63 67  Resp:      Temp:   97.6 F (36.4 C)   TempSrc:   Oral   SpO2: 95% 100% 97% 95%  Weight:   191 lb 8 oz (86.9 kg)   Height:        Intake/Output Summary (Last 24 hours) at 03/25/2018 1205 Last data filed at 03/25/2018 4782 Gross per 24 hour  Intake 643 ml  Output 250 ml  Net 393 ml   Filed Weights   03/24/18 1238 03/24/18 2107 03/25/18 0546  Weight: 192  lb (87.1 kg) 194 lb 1.6 oz (88 kg) 191 lb 8 oz (86.9 kg)   Body mass index is 28.28 kg/m.  General:  Well nourished, well developed, in no acute distress HEENT: normal Neck: no JVD Vascular: No carotid bruits  Cardiac:  normal S1, S2; RRR; no murmur Lungs:  clear to auscultation bilaterally, no wheezing, rhonchi or rales  Abd: soft, nontender, no hepatomegaly  Ext: no edema, 1+ bilateral pedal pulses Musculoskeletal:  No deformities, BUE and BLE strength normal and equal Skin: warm and dry  Neuro:  CNs 2-12 intact, no focal abnormalities noted Psych:  Normal affect   EKG:  The EKG was personally reviewed and demonstrates:  Sinus rhythm no arrhythmias  Telemetry:  Telemetry was personally reviewed and demonstrates:  sinus  Relevant CV  Studies:  Echo today pending  Left heart cath 10/05/17:  Ost LAD to Prox LAD lesion is 50% stenosed.  Mid LAD lesion is 55% stenosed. LIMA to LAD is patent.  SVG to diagonal is occluded. There is no significant obstruction in the native diagonal.  Ost Cx lesion is 80% stenosed.  Prox Cx to Mid Cx lesion is 50% stenosed. SVG to OM is patent.  Dist RCA lesion is 50% stenosed.  RPDA lesion is 40% stenosed.  Ost RCA to Prox RCA lesion is 75% stenosed. Origin to Prox Graft lesion is 70% stenosed.  A drug-eluting stent was successfully placed using a STENT SYNERGY DES 4X16, postdilated to 4.5 mm.  Post intervention, there is a 0% residual stenosis.  The left ventricular systolic function is normal.  LV end diastolic pressure is normal.  The left ventricular ejection fraction is 55-65% by visual estimate.  There is no aortic valve stenosis.   Continue dual antiplatelet therapy for at least one year and, clopidogrel likely indefinitely.  Aggressive secondary prevention.    Laboratory Data:  Chemistry Recent Labs  Lab 03/24/18 1328 03/25/18 0306  NA 139 139  K 4.4 3.8  CL 104 104  CO2 29 27  GLUCOSE 90 103*  BUN 21 16  CREATININE 1.02 1.16  CALCIUM 9.4 9.1  GFRNONAA >60 >60  GFRAA >60 >60  ANIONGAP 6 8    No results for input(s): PROT, ALBUMIN, AST, ALT, ALKPHOS, BILITOT in the last 168 hours. Hematology Recent Labs  Lab 03/24/18 1328  WBC 7.4  RBC 4.80  HGB 14.9  HCT 43.9  MCV 91.5  MCH 31.0  MCHC 33.9  RDW 13.1  PLT 216   Cardiac Enzymes Recent Labs  Lab 03/24/18 1328 03/24/18 1908 03/25/18 0306  TROPONINI <0.03 <0.03 <0.03   No results for input(s): TROPIPOC in the last 168 hours.  BNPNo results for input(s): BNP, PROBNP in the last 168 hours.  DDimer  Recent Labs  Lab 03/24/18 1328  DDIMER 0.29    Radiology/Studies:  Dg Chest 2 View  Result Date: 03/24/2018 CLINICAL DATA:  Chest pain.  Dizziness. EXAM: CHEST - 2 VIEW COMPARISON:  CT  02/09/2018. FINDINGS: Mediastinum hilar structures normal. Lungs are clear. No pleural effusion or pneumothorax. No acute cardiopulmonary disease. No acute bony abnormality. IMPRESSION: 1.  Prior CABG.  Heart size normal. 2.  No acute pulmonary disease. Electronically Signed   By: Marcello Moores  Register   On: 03/24/2018 13:02    Assessment and Plan:   1. Near-syncope - he denies complete loss of consciousness - orthostatic hypotension has been documented this admission - no arrhythmia on telemetry to explain episodes in which he can't remember events vs  near syncope - echo and carotid dopplers pending today - pressures have been well-controlled - aside from orthostatic hypotension, I do not find a cardiac etiology for his near syncopal episodes that occur when he is in bed - he may need a neuro workup  - will setup for an event monitor   2. Leg pain - CK was normal - intact pulses distally, I do not suspect an arterial occlusion - A1c, last checked 11/10/16 and was 5.0, low suspicion for neuropathy   3. Chest pain, known CAD - troponins have been negative and EKG without acute ischemia - recent ED visit for same 02/07/18 - has been working with [name] to titrate anti-anginals - he states his chest pain is constant and feels like it normally does, no new descriptors  - he has known coronary disease and this is his second recent hospitalization for chest pain - during his recent hospitalization in June of this year, chest pain was thought to be related to thoracic disc degeneration with referred pain, relieved with narcotics - he has essentially ruled out - I do not suspect a cardiac origin for his chest pain    For questions or updates, please contact [place] Please consult [email or web address] for contact info under Cardiology/STEMI.   Signed, [name], [place]  03/25/2018 12:05 PM  Patient seen and examined with the above-signed Advanced Practice Provider and/or  Housestaff. I personally reviewed laboratory data, imaging studies and relevant notes. I independently examined the patient and formulated the important aspects of the plan. I have edited the note to reflect any of my changes or salient points. I have personally discussed the plan with the patient and/or family.  62 y/o male with h/o CAD s/p CABG and PCIs, CVA . Admitted with multiple complaints including CP, frequent syncopal/nonresponsive episodes, lack of memory and leg pain.   At baseline says he des fine. 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   CP description is vague but seems to come and go and not related to exertion. ECG normal and troponin negative x 2. He also describes episodes where he will wake up and doesn't remember where he is. Denies frank syncope. No seizure-like activity. Had near syncopal episode in hospital when having blood drawn that was concerning for vasovagal event.   Echo reviewed personally EF ~50% with anteroseptal HK due to previous CABG. No change since 2017.  On exam appears very comfortable No carotid bruits Cor RRR no murmur Lungs clear anteriorly  Ab benign Extremities warm strong distal pulses Neuro exam grossly normal   Doubt CP is ischemic and he does not need further w/u at this point. Unclear if he is truly having syncopal episodes or just a memory issue. Tele and echo here are completely normal. Will arrange for 30-day outpatient monitor. Can also consider referral back to neuro for possible EEG to make sure he is not having absence-like seizure activity - though my suspicion for this is low.   Can go home today from our standpoint.  [facility] will sign off.   Medication Recommendations:  Continue current meds Other recommendations (labs, testing, etc):  Will arrange outpatient event monitor.  Follow up as an outpatient:  As scheduled with Dr. Irish Lack.   Glori Bickers, MD  2:40 PM

## 2018-03-25 NOTE — Evaluation (Signed)
Physical Therapy Evaluation Patient Details Name: [name] MRN: [id number] DOB: 1956/04/28 Today's Date: 03/25/2018   History of Present Illness  [name] is a 62 y.o. male with medical history significant for CAD status post CABG, history of CVA, hypertension, and anxiety, who presents to the emergency department with a general malaise, aches, and transient loss of consciousness    Clinical Impression  Patient evaluated by Physical Therapy with no further acute PT needs identified. All education has been completed and the patient has no further questions. Patient with several concerns and complaints that he has not yet been given a clear etiology of his symptoms. Reports intermittent blurred and colorful vision today, lightheaded with activity, BLE anterior leg pain. Orthostatics negative this session with no drop or symptoms. Ambulating hallway without AD or overt LOB however appears apprehensive and states that he feels nauseated. Advised pt to have wife supervise mobility in short term until he is feeling better. See below for any follow-up Physical Therapy or equipment needs. PT is signing off. Thank you for this referral.  Vitals: BP   116/74 supine    117/86 seated   118/73 standing     125/82 3 minutes standing  SpO2 97 % on RA HR 75-95 this visit.      Follow Up Recommendations No PT follow up;Supervision for mobility/OOB    Equipment Recommendations  None recommended by PT    Recommendations for Other Services       Precautions / Restrictions Precautions Precautions: None Restrictions Weight Bearing Restrictions: No      Mobility  Bed Mobility Overal bed mobility: Modified Independent                Transfers Overall transfer level: Modified independent Equipment used: None                Ambulation/Gait Ambulation/Gait assistance: Supervision Gait Distance (Feet): 175 Feet Assistive device: None Gait Pattern/deviations: Step-to  pattern;Step-through pattern Gait velocity: decreased   General Gait Details: pt with mild unsteady gait, walking without AD. reports lightheadness, VSS on RA.   Stairs            Wheelchair Mobility    Modified Rankin (Stroke Patients Only)       Balance Overall balance assessment: Mild deficits observed, not formally tested                                           Pertinent Vitals/Pain Pain Assessment: 0-10 Pain Score: 4  Pain Location: BLE shin Pain Descriptors / Indicators: Aching;Discomfort Pain Intervention(s): Limited activity within patient's tolerance;Monitored during session;Premedicated before session    Home Living Family/patient expects to be discharged to:: Private residence Living Arrangements: Spouse/significant other Available Help at Discharge: Family;Available 24 hours/day Type of Home: House Home Access: Level entry     Home Layout: Two level Home Equipment: None      Prior Function Level of Independence: Independent               Hand Dominance        Extremity/Trunk Assessment   Upper Extremity Assessment Upper Extremity Assessment: Overall WFL for tasks assessed(WNL coordination)    Lower Extremity Assessment Lower Extremity Assessment: Overall WFL for tasks assessed(WNL coordination. Sensory deficits noted BLE stocking feet)       Communication      Cognition Arousal/Alertness: Awake/alert Behavior During Therapy: Anxious  Overall Cognitive Status: Within Functional Limits for tasks assessed                                        General Comments      Exercises     Assessment/Plan    PT Assessment Patent does not need any further PT services  PT Problem List         PT Treatment Interventions      PT Goals (Current goals can be found in the Care Plan section)  Acute Rehab PT Goals Patient Stated Goal: "find out what is going on" PT Goal Formulation: With patient Time  For Goal Achievement: 04/01/18 Potential to Achieve Goals: Good    Frequency     Barriers to discharge        Co-evaluation               AM-PAC PT "6 Clicks" Daily Activity  Outcome Measure Difficulty turning over in bed (including adjusting bedclothes, sheets and blankets)?: None Difficulty moving from lying on back to sitting on the side of the bed? : None Difficulty sitting down on and standing up from a chair with arms (e.g., wheelchair, bedside commode, etc,.)?: None Help needed moving to and from a bed to chair (including a wheelchair)?: None Help needed walking in hospital room?: None Help needed climbing 3-5 steps with a railing? : A Little 6 Click Score: 23    End of Session Equipment Utilized During Treatment: Gait belt Activity Tolerance: Patient tolerated treatment well Patient left: in bed;with call bell/phone within reach Nurse Communication: Mobility status;Other (comment)(Patients concerns) PT Visit Diagnosis: Unsteadiness on feet (R26.81);Pain    Time: 5790-3833 PT Time Calculation (min) (ACUTE ONLY): 45 min   Charges:   PT Evaluation $PT Eval Moderate Complexity: 1 Mod PT Treatments $Gait Training: 8-22 mins        Reinaldo Berber, PT, DPT Acute Rehab Services Pager: [phone]    Reinaldo Berber 03/25/2018, 3:48 PM

## 2018-03-25 NOTE — Progress Notes (Signed)
*  PRELIMINARY RESULTS* Echocardiogram 2D Echocardiogram has been performed.  Leavy Cella 03/25/2018, 11:36 AM

## 2018-03-25 NOTE — Progress Notes (Signed)
Preliminary notes--Bilateral carotid duplex exam completed. Bilateral ICA 40-59%stenosis based on category of velocity. Bilateral vertebral arteries patent with antegrade flow.   Hongying Ely Ballen (RDMS RVT) 03/25/18 11:13 AM

## 2018-03-25 NOTE — Progress Notes (Addendum)
PROGRESS NOTE    Patient: [name]     PCP: [name], MD                    DOB: [birth year]            DOA: 03/24/2018 [id number]             DOS: 03/25/2018, 2:14 PM   LOS: 1 day   Date of Service: The patient was seen and examined on 03/25/2018  Subjective:  Patient was seen and examined this morning, complaining of vague chest and lower extremity pain, much improved from admission.  Concerned about his episodes of passing out, chest pain. Just completed 2D echocardiogram and bilateral carotid studies. No further syncopal episode this morning.  ----------------------------------------------------------------------------------------------------------------------  Brief Narrative:    Deangelo Berns is a 62 y.o. male with medical history significant for CAD status post CABG, history of CVA, hypertension, and anxiety, who presents to the emergency department with a general malaise, aches, and transient loss of consciousness.  Patient reports he went to bed in his usual state but woke with generalized aches, most severe in the bilateral lower extremities, mild nausea, and lightheadedness.  He was walking down some steps in his house when he had a transient loss of awareness, finding himself sitting on the steps but on certain of what it just happened.  He denies any head injury and does not believe that he fell.  He went on about his usual activities, continued to have generalized aches, malaise, and lightheadedness, and eventually presented to the emergency department.  Upon arrival to the ED, patient is found to be afebrile, saturating well on room air, and with vitals otherwise stable.  EKG features a sinus rhythm, chest x-ray is negative for acute cardiopulmonary disease, and chemistry panel and CBC are unremarkable.  Troponin is undetectable x2 almost 6 hours apart.  D-dimer is normal.  Patient went on to develop chest pain after a second transient loss of awareness while  resting in the hospital bed.  Pain has been constant, moderate in intensity, localized to the central chest, and associated with mild nausea and malaise, but no shortness of breath or diaphoresis.  He was treated with 324 mg of aspirin, nitroglycerin, and morphine in the ED.  Cardiology was consulted by the ED physician and recommended medical admission.      Principal Problem:   Chest pain Active Problems:   Anxiety   Coronary artery disease involving native coronary artery of native heart with unstable angina pectoris (HCC)   S/P CABG x 5   Ischemic cardiomyopathy   Hyperlipidemia LDL goal <70   Essential hypertension   Ectopic atrial tachycardia (HCC)   Transient loss of consciousness   Assessment & Plan:   Chest pain; CAD  -With history of coronary artery disease, status post CABG -Cardiac enzymes were negative, no acute changes in EKG -Chest x-ray within normal limits -Pending 2D echocardiogram -Appreciate cardiology input -Continue supportive therapy aspirin PRN nitroglycerin, IV fluids, continue home medication Lipitor, lisinopril, Toprol   Syncope  -Possibly vasovagal,  the second episode was while patient was in bed.  In ED -Complaint nonspecific pain in lower extremity, chest pain which has resolved -Mildly positive orthostatic blood pressure checks -Continue IV fluids, neurochecks, PT evaluation -Pending echo -Negative for any dysrhythmia on monitor or an EKG -Bilateral carotid studies -ICA 40-49% stenosis bilateral   Hypertension  - Pressure well controlled, continue Norvasc, lisinopril, Toprol  Hx of CVA  -  No acute focal deficits  - Continue ASA, Plavix, and Lipitor    Annular Skin rash on the back  -Possible ringworm, will initiate oral treatment with griseofulvin -Monitor closely  DVT prophylaxis: Lovenox Code Status: Full  Family Communication: Discussed with patient  Consults called: Cardiology Admission status: Inpatient     Procedures:    No admission procedures for hospital encounter. Objective: Vitals:   03/25/18 0101 03/25/18 0200 03/25/18 0546 03/25/18 1133  BP:   114/80 126/83  Pulse: 73 61 63 67  Resp:      Temp:   97.6 F (36.4 C)   TempSrc:   Oral   SpO2: 95% 100% 97% 95%  Weight:   86.9 kg (191 lb 8 oz)   Height:        Intake/Output Summary (Last 24 hours) at 03/25/2018 1414 Last data filed at 03/25/2018 1300 Gross per 24 hour  Intake 1003 ml  Output 250 ml  Net 753 ml   Filed Weights   03/24/18 1238 03/24/18 2107 03/25/18 0546  Weight: 87.1 kg (192 lb) 88 kg (194 lb 1.6 oz) 86.9 kg (191 lb 8 oz)    Examination:  General exam: Appears calm and comfortable  Psychiatry: Judgement and insight appear normal. Mood & affect appropriate. HEENT: WNLs Respiratory system: Clear to auscultation. Respiratory effort normal. Cardiovascular system: S1 & S2 heard, RRR. No JVD, murmurs, rubs, gallops or clicks. No pedal edema. Gastrointestinal system: Abd. nondistended, soft and nontender. No organomegaly or masses felt. Normal bowel sounds heard. Central nervous system: Alert and oriented. No focal neurological deficits. Extremities: Symmetric 5 x 5 power. Skin: No rashes, annular lesion on the skin back, negative any other rash or open lesions   Data Reviewed: I have personally reviewed following labs and imaging studies  CBC: Recent Labs  Lab 03/24/18 1328  WBC 7.4  HGB 14.9  HCT 43.9  MCV 91.5  PLT 616   Basic Metabolic Panel: Recent Labs  Lab 03/24/18 1328 03/25/18 0306  NA 139 139  K 4.4 3.8  CL 104 104  CO2 29 27  GLUCOSE 90 103*  BUN 21 16  CREATININE 1.02 1.16  CALCIUM 9.4 9.1    Recent Labs  Lab 03/24/18 1328 03/24/18 1908 03/25/18 0306  CKTOTAL  --   --  63  TROPONINI <0.03 <0.03 <0.03   Radiology Studies: Dg Chest 2 View  Result Date: 03/24/2018 CLINICAL DATA:  Chest pain.  Dizziness. EXAM: CHEST - 2 VIEW COMPARISON:  CT 02/09/2018. FINDINGS: Mediastinum hilar  structures normal. Lungs are clear. No pleural effusion or pneumothorax. No acute cardiopulmonary disease. No acute bony abnormality. IMPRESSION: 1.  Prior CABG.  Heart size normal. 2.  No acute pulmonary disease. Electronically Signed   By: [place]   On: 03/24/2018 13:02    Scheduled Meds: . amLODipine  10 mg Oral Daily  . aspirin  81 mg Oral Daily  . atorvastatin  80 mg Oral q1800  . clopidogrel  75 mg Oral Daily  . enoxaparin (LOVENOX) injection  40 mg Subcutaneous Q24H  . lisinopril  5 mg Oral Daily  . metoprolol succinate  25 mg Oral QHS  . metoprolol succinate  50 mg Oral Daily  . multivitamin with minerals  1 tablet Oral Daily  . pantoprazole  40 mg Oral Daily  . ranolazine  1,000 mg Oral BID  . sodium chloride flush  3 mL Intravenous Q12H  . sodium chloride flush  3 mL Intravenous Q12H   Continuous  Infusions: . sodium chloride    . sodium chloride 100 mL/hr at 03/25/18 1123    Time spent: >25 minutes  Deatra James, MD [facility],  Pager [phone]  If 7PM-7AM, please contact night-coverage [email or web address]   Password [facility]  03/25/2018, 2:14 PM

## 2018-03-26 DIAGNOSIS — I255 Ischemic cardiomyopathy: Secondary | ICD-10-CM

## 2018-03-26 DIAGNOSIS — I471 Supraventricular tachycardia: Secondary | ICD-10-CM

## 2018-03-26 MED ORDER — TERBINAFINE HCL 1 % EX CREA: TOPICAL_CREAM | Freq: Two times a day (BID) | CUTANEOUS | 0 refills | Status: DC

## 2018-03-26 NOTE — Discharge Summary (Signed)
Physician Discharge Summary [facility]    Patient: [name]                   Admit date: 03/24/2018   DOB: 03/05/1956             Discharge date:03/26/2018/11:14 AM PPJ:[id number]                           PCP: [name], MD Recommendations for Outpatient Follow-up:   1.  Please follow-up with your primary care physician within 1-2 weeks. 2.  F/ufurther w/u  with cardiology  Will arrange for 30-day outpatient monitor.     Discharge Condition: Stable  CODE STATUS:  Full code    Diet recommendation:  Cardiac diet   ----------------------------------------------------------------------------------------------------------------------  Discharge Diagnoses:   Principal Problem:   Chest pain Active Problems:   Anxiety   Coronary artery disease involving native coronary artery of native heart with unstable angina pectoris (HCC)   S/P CABG x 5   Ischemic cardiomyopathy   Hyperlipidemia LDL goal <70   Essential hypertension   Ectopic atrial tachycardia (HCC)   Transient loss of consciousness   History of present illness : Anthony Kubrickis a 62 y.o.malewith medical history significant forCAD status post CABG, history of CVA, hypertension, and anxiety, who presents to the emergency department with a general malaise, aches, and transient loss of consciousness. Patient reports he went to bed in his usual state but woke with generalized aches, most severe in the bilateral lower extremities, mild nausea, and lightheadedness. He was walking down some steps in his house when he had a transient loss of awareness, finding himself sitting on the steps but on certain of what it just happened. He denies any head injury and does not believe that he fell. He went on about his usual activities, continued to have generalized aches, malaise, and lightheadedness, and eventually presented to the emergency department.  Upon arrival to the ED, patient is found to be afebrile,  saturating well on room air, and with vitals otherwise stable. EKG features a sinus rhythm, chest x-ray is negative for acute cardiopulmonary disease, and chemistry panel and CBC are unremarkable. Troponin is undetectable x2 almost 6 hours apart.D-dimer is normal. Patient went on to develop chest pain after a second transient loss of awareness while resting in the hospital bed. Pain has been constant, moderate in intensity, localized to the central chest, and associated with mild nausea and malaise, but no shortness of breath or diaphoresis. He was treated with 324 mg of aspirin, nitroglycerin, and morphine in the ED. Cardiology was consulted by the ED physician and recommended medical admission.  Hospital course / Brief Summary: She was admitted given patient history of coronary artery disease, CABG, also syncope was addressed.  Work-up was negative, cardiologist has seen and evaluate the patient, no changes were made to his current medications.  He is to follow-up as an outpatient with cardiologist, and arrangement and set up of Holter monitor for approximately 10 days. Also has other cold reasons been addressed medication were to be continued with no changes. Skin lesions on her back were addressed, patient was initiated on topical Lamisil 3 continue treatment until resolution.  For detailed discharge summary please see below:  Chest pain; CAD -With history of coronary artery disease, status post CABG -Cardiac enzymes were negative, no acute changes in EKG -Chest x-ray within normal limits -2D echocardiogram and bilateral cord studies were reviewed -Cardiologist was  consulted, does not think the chest pain was cardiac in nature, ischemic changes were noted  Syncope -Possibly vasovagal, the second episode was while patient was in bed.  In ED -Complaint nonspecific pain in lower extremity, chest pain which has resolved -Status post volume fluid resuscitation, PT  evaluation -Neurochecks within normal limits, -Negative for any dysrhythmia on monitor or an EKG -Bilateral carotid studies -ICA 40-49% stenosis bilateral -Echocardiogram was reviewed all within normal limits, EJF 60 to 65% -Patient is to follow with cardiology as an outpatient anticipating Holter monitor for 30 days to be set up as an outpatient with cardiology.  Hypertension -Pressure well controlled, continue Norvasc, lisinopril, Toprol  Hx of CVA -No acute focal deficits -Continue ASA, Plavix, and Lipitor  Annular Skin rash on the back  -Possible ringworm, will initiate oral treatment with Lamisil with a cream, -We recommend to continue topical cream treatment until it resolves -Monitor closely  Disposition-patient has been cleared to be discharged home  Consultations:  Cardiology -------------------------------------------------------------------------------------------------------------------------------------------------  Discharge Instructions:   Discharge Instructions    Activity as tolerated - No restrictions   Complete by:  As directed    Activity as tolerated - No restrictions   Complete by:  As directed    Diet - low sodium heart healthy   Complete by:  As directed    Diet - low sodium heart healthy   Complete by:  As directed    Discharge instructions   Complete by:  As directed    Will F/up cardiology for 30-day outpatient monitor.   Discharge instructions   Complete by:  As directed    F/up with Cardiology, Will arrange for 30-day outpatient monitor.   Increase activity slowly   Complete by:  As directed    Increase activity slowly   Complete by:  As directed        Medication List    TAKE these medications   acetaminophen 500 MG tablet Commonly known as:  TYLENOL Take 1,000 mg by mouth every 6 (six) hours as needed for mild pain or headache.   amLODipine 10 MG tablet Commonly known as:  NORVASC Take 1 tablet (10 mg total) by  mouth daily.   aspirin 81 MG tablet Take 81 mg by mouth daily.   atorvastatin 80 MG tablet Commonly known as:  LIPITOR Take 1 tablet (80 mg total) by mouth daily.   chlordiazePOXIDE 10 MG capsule Commonly known as:  LIBRIUM Take 10 mg by mouth 4 (four) times daily as needed for anxiety.   clopidogrel 75 MG tablet Commonly known as:  PLAVIX take 1 tablet by mouth once daily   fluticasone 50 MCG/ACT nasal spray Commonly known as:  FLONASE Place 1 spray into both nostrils as needed for allergies.   lisinopril 5 MG tablet Commonly known as:  PRINIVIL,ZESTRIL Take 5 mg by mouth daily. May take 1 additional tablet once daily for SBP greater than 150 once hour after taking daily dose What changed:    how much to take  how to take this  when to take this  additional instructions   metoprolol succinate 50 MG 24 hr tablet Commonly known as:  TOPROL XL Take 1 tablet (50 mg total) by mouth as directed. 50 mg in the AM and 25 mg in the PM   nitroGLYCERIN 0.4 MG SL tablet Commonly known as:  NITROSTAT Place 1 tablet (0.4 mg total) under the tongue every 5 (five) minutes as needed for chest pain. 3 DOSE MAX  ONE-A-DAY MENS 50+ ADVANTAGE PO Take 1 tablet by mouth daily.   pantoprazole 40 MG tablet Commonly known as:  PROTONIX Take 40 mg by mouth daily.   ranitidine 150 MG tablet Commonly known as:  ZANTAC Take 150 mg by mouth as needed for heartburn.   ranolazine 1000 MG SR tablet Commonly known as:  RANEXA Take 1 tablet (1,000 mg total) by mouth 2 (two) times daily.   terbinafine 1 % cream Commonly known as:  LAMISIL Apply topically 2 (two) times daily.   traZODone 50 MG tablet Commonly known as:  DESYREL Take 50-150 mg by mouth at bedtime as needed for sleep.       Allergies  Allergen Reactions  . Prednisone Other (See Comments)    States that this med makes him "crazy"  . Tetanus Toxoids Swelling and Other (See Comments)    Fever, Swelling of the arm   .  Wellbutrin [Bupropion] Other (See Comments)    Crazy thoughts, nightmares  . Morphine And Related Hives and Itching    Redness at the injection site  . Scallops [Shellfish Allergy] Nausea Only  . Chantix [Varenicline] Other (See Comments)    Dreams      Procedures/Studies: Dg Chest 2 View  Result Date: 03/24/2018 CLINICAL DATA:  Chest pain.  Dizziness. EXAM: CHEST - 2 VIEW COMPARISON:  CT 02/09/2018. FINDINGS: Mediastinum hilar structures normal. Lungs are clear. No pleural effusion or pneumothorax. No acute cardiopulmonary disease. No acute bony abnormality. IMPRESSION: 1.  Prior CABG.  Heart size normal. 2.  No acute pulmonary disease. Electronically Signed   By: Marcello Moores  Register   On: 03/24/2018 13:02      Subjective: Patient was seen and examined 03/26/2018, 11:14 AM Patient stable  Today. No acute distress.  No issues overnight Stable for discharge.  Discharge Exam:  Vitals:   03/25/18 1133 03/25/18 2033 03/25/18 2225 03/26/18 0516  BP: 126/83 99/67 108/71 116/76  Pulse: 67 62 67 69  Resp:  20  20  Temp:  97.7 F (36.5 C)  98.4 F (36.9 C)  TempSrc:  Oral  Oral  SpO2: 95% 96%  97%  Weight:    89.1 kg (196 lb 6.4 oz)  Height:        General: Pt lying comfortably in bed & appears in no obvious distress. Cardiovascular: S1 & S2 heard, RRR, S1/S2 +. No murmurs, rubs, gallops or clicks. No JVD or pedal edema. Respiratory: Clear to auscultation without wheezing, rhonchi or crackles. No increased work of breathing. Abdominal:  Non distended, non tender & soft. No organomegaly or masses appreciated. Normal bowel sounds heard. CNS: Alert and oriented. No focal deficits. Extremities: no edema, no cyanosis    The results of significant diagnostics from this hospitalization (including imaging, microbiology, ancillary and laboratory) are listed below for reference.     Microbiology: No results found for this or any previous visit (from the past 240 hour(s)).    Labs: CBC: Recent Labs  Lab 03/24/18 1328  WBC 7.4  HGB 14.9  HCT 43.9  MCV 91.5  PLT 782   Basic Metabolic Panel: Recent Labs  Lab 03/24/18 1328 03/25/18 0306  NA 139 139  K 4.4 3.8  CL 104 104  CO2 29 27  GLUCOSE 90 103*  BUN 21 16  CREATININE 1.02 1.16  CALCIUM 9.4 9.1   Liver Function Tests: No results for input(s): AST, ALT, ALKPHOS, BILITOT, PROT, ALBUMIN in the last 168 hours. BNP (last 3 results) Recent Labs  10/04/17 1524 02/07/18 1246  BNP 57.0 28.0   Cardiac Enzymes: Recent Labs  Lab 03/24/18 1328 03/24/18 1908 03/25/18 0306  CKTOTAL  --   --  74  TROPONINI <0.03 <0.03 <0.03   CBG: No results for input(s): GLUCAP in the last 168 hours. Hgb A1c No results for input(s): HGBA1C in the last 72 hours. Lipid Profile No results for input(s): CHOL, HDL, LDLCALC, TRIG, CHOLHDL, LDLDIRECT in the last 72 hours. Thyroid function studies No results for input(s): TSH, T4TOTAL, T3FREE, THYROIDAB in the last 72 hours.  Invalid input(s): FREET3 Anemia work up No results for input(s): VITAMINB12, FOLATE, FERRITIN, TIBC, IRON, RETICCTPCT in the last 72 hours. Urinalysis    Component Value Date/Time   COLORURINE YELLOW 11/09/2016 1010   APPEARANCEUR CLEAR 11/09/2016 1010   LABSPEC 1.017 11/09/2016 1010   PHURINE 5.0 11/09/2016 1010   GLUCOSEU NEGATIVE 11/09/2016 1010   HGBUR NEGATIVE 11/09/2016 1010   BILIRUBINUR NEGATIVE 11/09/2016 1010   KETONESUR NEGATIVE 11/09/2016 1010   PROTEINUR NEGATIVE 11/09/2016 1010   UROBILINOGEN 0.2 05/13/2014 0658   NITRITE NEGATIVE 11/09/2016 1010   LEUKOCYTESUR NEGATIVE 11/09/2016 1010    Time coordinating discharge: Over 30 minutes  SIGNED: Deatra James, MD, FACP, FHM. [facility],  Pager [phone]  If 7PM-7AM, please contact night-coverage [email or web address], Password [facility] 03/26/2018, 11:14 AM

## 2018-03-28 MED FILL — Ondansetron HCl Inj 4 MG/2ML (2 MG/ML): INTRAMUSCULAR | Qty: 2 | Status: AC

## 2018-04-04 NOTE — Progress Notes (Deleted)
Cardiology Office Note:    Date:  04/04/2018   ID:  [name], DOB June 09, 1956, MRN [id number]  PCP:  Anthony Melter, MD  Cardiologist:  Anthony Grooms, MD ***  Referring MD: Anthony Melter, MD   No chief complaint on file. ***  History of Present Illness:    [name] is a 62 y.o. male with coronary artery disease status post anterior myocardial infarction in March 2017 treated with emergent CABG, systolic heart failure secondary to ischemic cardiomyopathy, prior right brain stroke treated with TPA in March 2018.  Follow-up echocardiogram in May 2017 demonstrated improved LV function with EF 45-50%.  He has had multiple cardiac catheterization since his bypass surgery with known occluded vein graft to the diagonal and moderate disease in the SVG-RCA.  He underwent PCI with DES to the native RCA in February 2019.  He was admitted with chest pain and palpitations in 01/2018 and was noted to be in ectopic atrial tachycardia.  His beta-blocker dose was adjusted.  Last seen 02/24/18.  He continued to have symptoms and I increased his beta-blocker and Ranolazine.  He had to decrease his dose of Metoprolol due to side effects.    He was admitted 7/26-7/28 with questionable syncope and chest pain.  Records indicate the patient had loss of awareness while at home that occurred while walking down steps.  He found himself at the bottom of the steps without awareness of how he got there.  He also had a similar episode while sitting in the emergency room bed.  Cardiac enzymes remain normal.  Echocardiogram demonstrated normal LV function.  Carotid ultrasound demonstrated bilateral ICA stenosis 40-59%.  He was seen by cardiology.  Outpatient event monitor was recommended.  [name] ***  Prior CV studies:   The following studies were reviewed today:  Carotid US 03/25/18 bilat ICA 40-59  Echo 03/25/18 EF 60-65, no RWMA, normal diastolic function, calcified aortic valve, trivial MR, lipomatous  atrial septal hypertrophy, trivial TR.  Cardiac catheterization 10/05/2017 LAD ostial 86, mid 63 RI ostial 90, 75 LCx ostial 80, proximal 50 RCA ostial 75, distal 50, RPDA 40 LIMA-LAD patent SVG-DX 100 SVG-OM patent SVG-RCA 70 EF 55-65 PCI: 4 x 16 mm Synergy DES to the ostial RCA  Nuclear stress test 06/22/2017 EF 49, anterior apical MI, no ischemia, intermediate risk  GXT 01/13/2017 Normal blood pressure response to exercise, no ST segment deviation during stress; no ischemia  Event Monitor 11/23/16  Normal sinus rhythm predominantly.  Sinus bradycardia intermittently. Lowest HR 41 bpm. No symptoms reported at that time.  Patient's symptoms did not correlate to any significant arrhythmias.  Echocardiogram 11/09/2016 EF 55-60, normal wall motion, grade 1 diastolic dysfunction, MAC, trace MR, trace TR  Event monitor 04/27/16 NSR, sinus brady, 3.5 sec pause during sleep; no significant arrhythmias  Nuclear stress test 04/20/2016 EF 54, no ischemia, high risk due to transient ischemic dilatation  Limited echo 12/30/2015 EF 45-50, mild anterior hypokinesis  Intraoperative TEE EF 25-30%  LHC 11/28/15 LAD ostial 100% >> PCI: POBA only with 40% residual LCx ostial 90%, proximal 50% RCA proximal 80% Ostial LAD occlusion is a culprit for his symptoms. Significant disease in his ostial circumflex and proximal right as well. Patient had balloon pump placed and then was sent to the operating room for bypass surgery.  Past Medical History:  Diagnosis Date  . Anxiety   . Basal cell carcinoma (BCC) of forehead   . CAD (coronary artery disease)    a. 10/2015 ant  STEMI >> LHC with 3 v CAD; oLAD tx with POBA >> emergent CABG. b. Multiple evals since that time, early graft failure of SVG-RCA by cath 03/2016. c. 2/19 PCI/DES x1 to pRCA, normal EF.  Marland Kitchen Depression   . Dyspnea   . Ectopic atrial tachycardia ([place])   . Esophageal reflux    eosinophil esophagitis  . Family history of  adverse reaction to anesthesia    "sister has PONV" (06/21/2017)  . Former tobacco use   . Gout   . Hepatitis C    "treated and cured" (06/21/2017)  . High cholesterol   . Hypertension   . Ischemic cardiomyopathy    a. EF 25-30% at intraop TEE 4/17  //  b. Limited Echo 5/17 - EF 45-50%, mild ant HK. c. EF 55-65% by cath 09/2017.  . Migraine    "3-4/yr" (06/21/2017)  . Myocardial infarction ([place]) 10/2015  . Sinus bradycardia    a. HR dropping into 40s in 02/2016 -> BB reduced.  . Stroke ([place]) 10/2016   "small one; sometimes my memory/cognitive issues" (06/21/2017)  . Symptomatic hypotension    a. 02/2016 ER visit -> meds reduced.   Surgical Hx: The patient  has a past surgical history that includes Cardiac catheterization (N/A, 06/14/2016); LEFT HEART CATH AND CORS/GRAFTS ANGIOGRAPHY (N/A, 03/11/2017); Humerus surgery (Right, 1969); Excision basal cell carcinoma; Cardiac catheterization (N/A, 11/28/2015); Cardiac catheterization (N/A, 11/28/2015); Cardiac catheterization (N/A, 11/28/2015); Cardiac catheterization (N/A, 04/21/2016); Cardiac catheterization (N/A, 06/14/2016); Cardiac catheterization (N/A, 09/08/2016); Cardiac catheterization; Coronary artery bypass graft (N/A, 11/28/2015); LEFT HEART CATH AND CORS/GRAFTS ANGIOGRAPHY (N/A, 10/05/2017); and CORONARY STENT INTERVENTION (N/A, 10/05/2017).   Current Medications: No outpatient medications have been marked as taking for the 04/05/18 encounter (Appointment) with Anthony Dopp T, PA-C.     Allergies:   Prednisone; Tetanus toxoids; Wellbutrin [bupropion]; Morphine and related; Scallops [shellfish allergy]; and Chantix [varenicline]   Social History   Tobacco Use  . Smoking status: Former Smoker    Packs/day: 0.75    Years: 44.00    Pack years: 33.00    Types: Cigarettes    Last attempt to quit: 11/28/2015    Years since quitting: 2.3  . Smokeless tobacco: Never Used  Substance Use Topics  . Alcohol use: No    Frequency: Never     Comment: 06/21/2017 "I'll have a few drinks q couple months"  . Drug use: No    Comment: 06/21/2017 "nothing since the 1980s"     Family Hx: The patient's family history includes Heart Problems in his father; Heart attack in his maternal grandmother and paternal uncle; Heart attack (age of onset: 12) in his father; Heart failure in his father; Hypertension in his brother; Lung cancer in his mother; Stroke in his father and maternal grandmother. There is no history of Autoimmune disease.  ROS:   Please see the history of present illness.    ROS All other systems reviewed and are negative.   EKGs/Labs/Other Test Reviewed:    EKG:  EKG is *** ordered today.  The ekg ordered today demonstrates ***  Recent Labs: 01/03/2018: ALT 15 02/07/2018: B Natriuretic Peptide 28.0; Magnesium 2.1; TSH 1.319 03/24/2018: Hemoglobin 14.9; Platelets 216 03/25/2018: BUN 16; Creatinine, Ser 1.16; Potassium 3.8; Sodium 139   Recent Lipid Panel Lab Results  Component Value Date/Time   CHOL 114 11/08/2017 08:33 AM   TRIG 93 11/08/2017 08:33 AM   HDL 52 11/08/2017 08:33 AM   CHOLHDL 2.2 11/08/2017 08:33 AM   CHOLHDL 2.2 06/22/2017  02:56 AM   LDLCALC 43 11/08/2017 08:33 AM    Physical Exam:    VS:  There were no vitals taken for this visit.    Wt Readings from Last 3 Encounters:  03/26/18 196 lb 6.4 oz (89.1 kg)  02/24/18 197 lb (89.4 kg)  02/09/18 192 lb 6.4 oz (87.3 kg)     ***Physical Exam  ASSESSMENT & PLAN:    Coronary artery disease involving native coronary artery of native heart with angina pectoris (HCC)  Atrial tachycardia ([place])  Essential hypertension  Hyperlipidemia, unspecified hyperlipidemia type  Syncope, unspecified syncope type***  Coronary artery disease involving native coronary artery of native heart with angina pectoris ([place])  History of anterior MI followed by emergent CABG in 2017.  He has had multiple cardiac catheterizations since that time.  Last heart  catheterization was performed in February 2019 and he underwent DES to the native RCA.  He has a known occluded vein graft to the diagonal and significant disease in the SVG-RCA.  He follows up today after a recent admission for recurrent chest pain.  Cardiac markers were negative.  He does have some symptoms that seem to be associated with palpitations.  He denies any exertional symptoms.  His pain does remind him somewhat of his previous angina.  At this point, I have recommended continued titration of his antianginal therapy.  If he fails this, we will need to consider whether or not to proceed with stress testing versus cardiac catheterization.             -Continue amlodipine, aspirin, atorvastatin, clopidogrel, lisinopril.               -Increase Ranexa to 1000 mg twice daily             -Increase metoprolol succinate to 100 mg daily             -Follow-up 6 weeks  Pulmonary nodule Small nodule noted on chest CTA in the hospital.  He needs a follow-up CT without contrast in 1 year.  This will be arranged.  Atrial tachycardia ([place]) He had elevated heart rates into the 140s-160s in the hospital.  His beta-blocker dose was adjusted.  As noted, I will increase his Toprol to 100 mg daily.  If he continues to have symptoms, we may need to consider referral to EP.  Essential hypertension The patient's blood pressure is controlled on his current regimen.  Continue current therapy.   Hypercholesteremia LDL optimal on most recent lab work.  Continue current Rx.    Dispo:  No follow-ups on file.   Medication Adjustments/Labs and Tests Ordered: Current medicines are reviewed at length with the patient today.  Concerns regarding medicines are outlined above.  Tests Ordered: No orders of the defined types were placed in this encounter.  Medication Changes: No orders of the defined types were placed in this encounter.   Signed, Anthony Dopp, PA-C  04/04/2018 10:55 PM    [facility] [address], [place], [place]  [address] Phone: [phone]; Fax: [phone]

## 2018-04-05 ENCOUNTER — Ambulatory Visit: Payer: Self-pay | Admitting: Physician Assistant

## 2018-04-06 ENCOUNTER — Telehealth: Payer: Self-pay | Admitting: Physician Assistant

## 2018-04-06 NOTE — Telephone Encounter (Signed)
If PCP does not have an appointment, he could go to urgent care. [name], PA-C    04/06/2018 5:10 PM

## 2018-04-06 NOTE — Telephone Encounter (Signed)
I returned call to the pt who was calling about abdominal pain and flu like symptoms. Pt states he is not really having flu like symptoms, but he is having diarrhea, abdominal pain, generalized weakness, fatigue since he has been d/c from the hospital 03/24/18. Pt denies fever, chills, dysuria. Pt states he thinks this may be coming from the Metoprolol. Pt is on Toprol XL 50 mg AM and 25 mg PM. Pt states he has only been taking 50 mg in the morning and not taking the 25 mg in the PM x 4-5 days. Pt states his BP has been doing pretty well, though he did not have any readings for me. I encourage the pt to stay hydrated, which pt states he has been drinking water and Gatorade. I did advise with the symptoms he is having he should call his PCP for an appt as that he may have a GI bug. Advised pt that I will d/w [name], PA for further recommendations. Pt thanked me for the call and is agreeable to call PCP as well.

## 2018-04-06 NOTE — Telephone Encounter (Signed)
Pt states he did not want to go to an Urgent Care because he doesn't trust them.

## 2018-04-06 NOTE — Telephone Encounter (Signed)
New Message   Pt c/o medication issue:  1. Name of Medication:metoprolol succinate (TOPROL XL) 50 MG 24 hr tablet   2. How are you currently taking this medication (dosage and times per day)? Take 1 tablet (50 mg total) by mouth as directed. 50 mg in the AM and 25 mg in the PM  3. Are you having a reaction (difficulty breathing--STAT)? Abdominal pain   4. What is your medication issue? Patient complains of abdominal pain and flu like symptoms.

## 2018-04-06 NOTE — Telephone Encounter (Signed)
Pt has been made aware of recommendations from [name], [place] that he should f/u w/PCP first.  Pt is concerned that with tomorrow being Friday and if he cannot get into his PCP should he keep taking the Toprol with the way he is feeling. I advised pt at this time we not stopping the Toprol and let's see what PCP says. Need to make sure his symptoms are not coming from possibly a GI bug or the flu etc. Pt is agreeable to plan of care and will PCP.

## 2018-04-06 NOTE — Telephone Encounter (Signed)
Agree he should follow up with PCP.  He has been on Metoprolol for a while.  I am not certain his symptoms are related to it.  I would see PCP first. [name], PA-C    04/06/2018 3:13 PM

## 2018-04-09 IMAGING — CR DG CHEST 2V
2 series · 2 of 2 positions shown · non-contrast
Comparison: 04/19/2016

CLINICAL DATA: Chest pain, shortness of breath and nausea

EXAM:
CHEST  2 VIEW

[w chest pa]
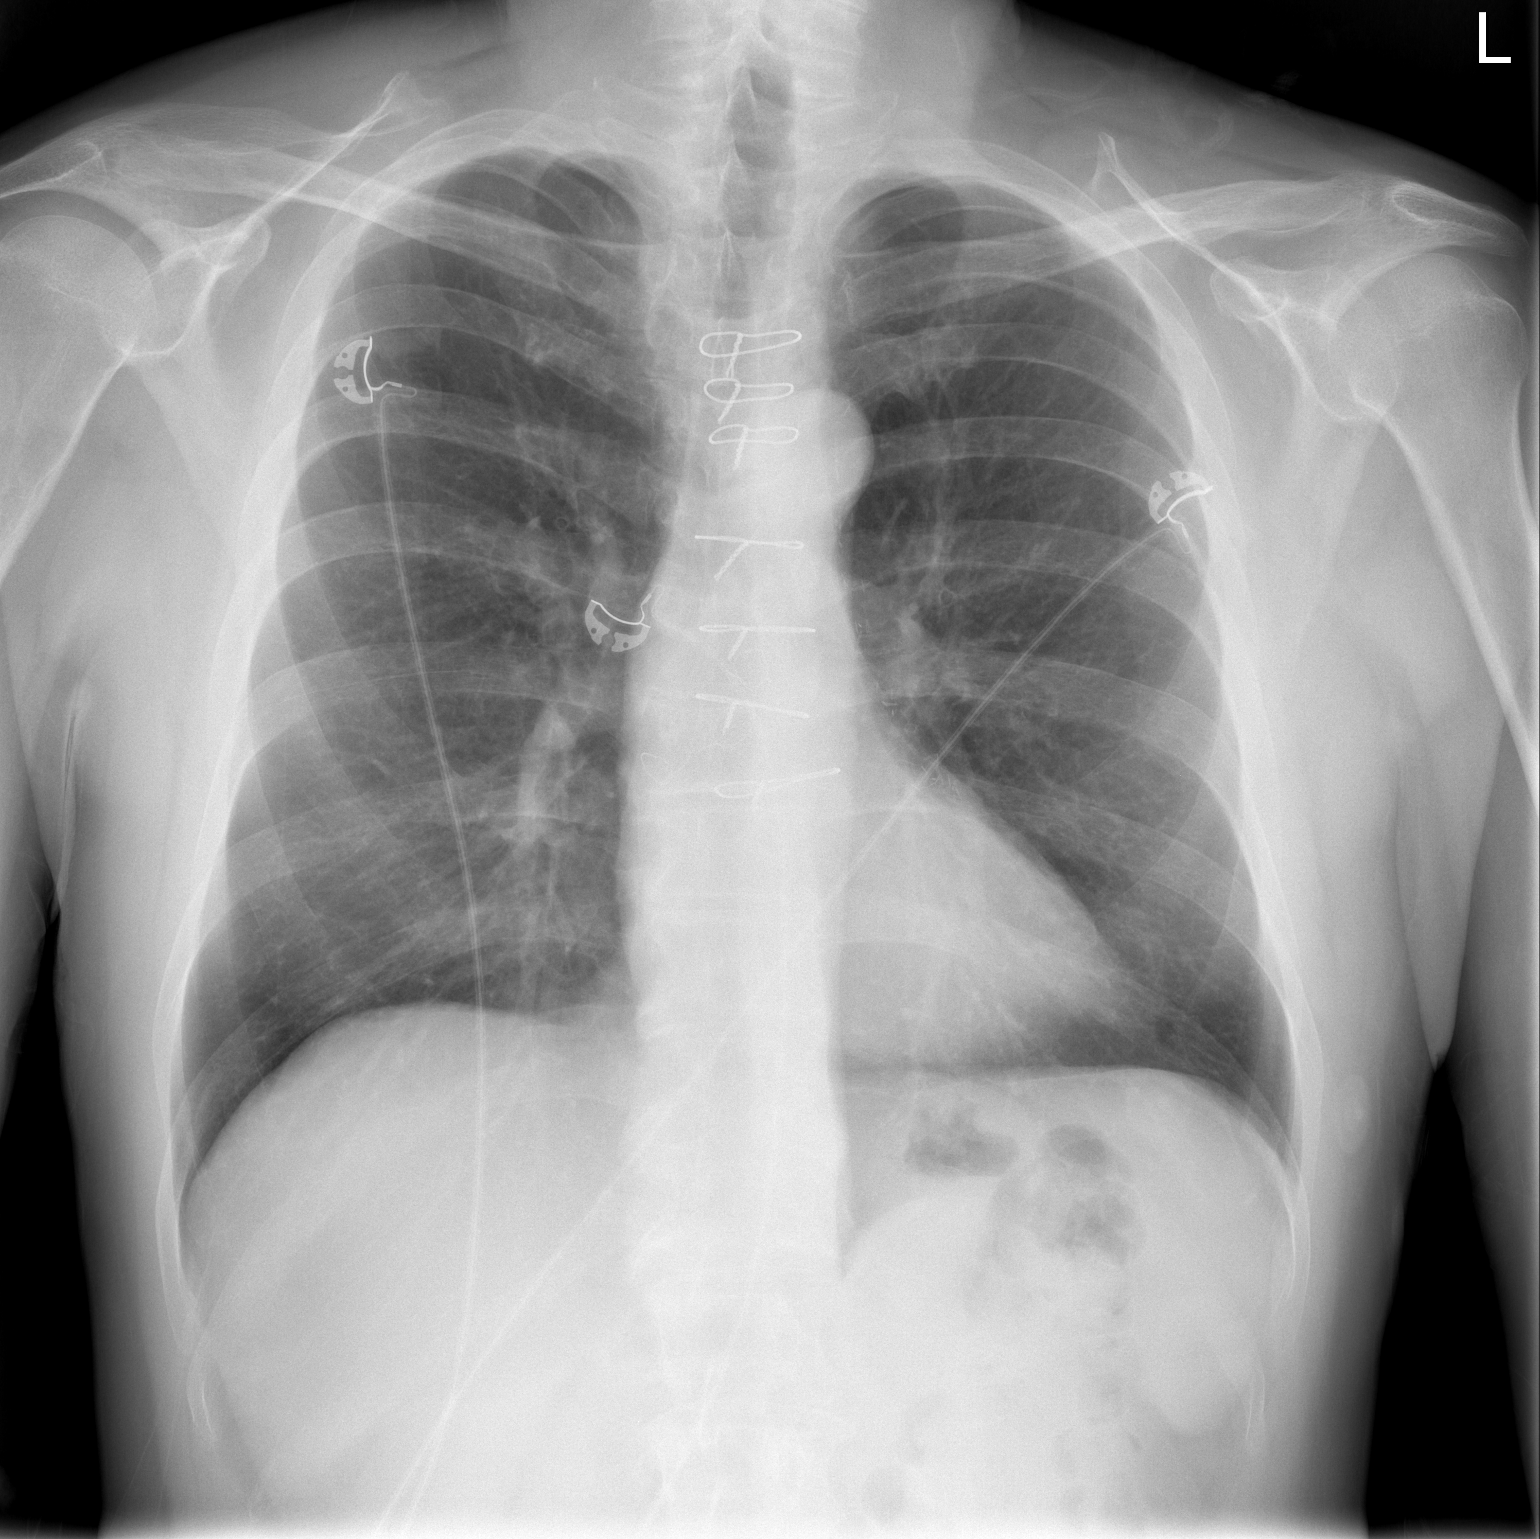

[w chest lat]
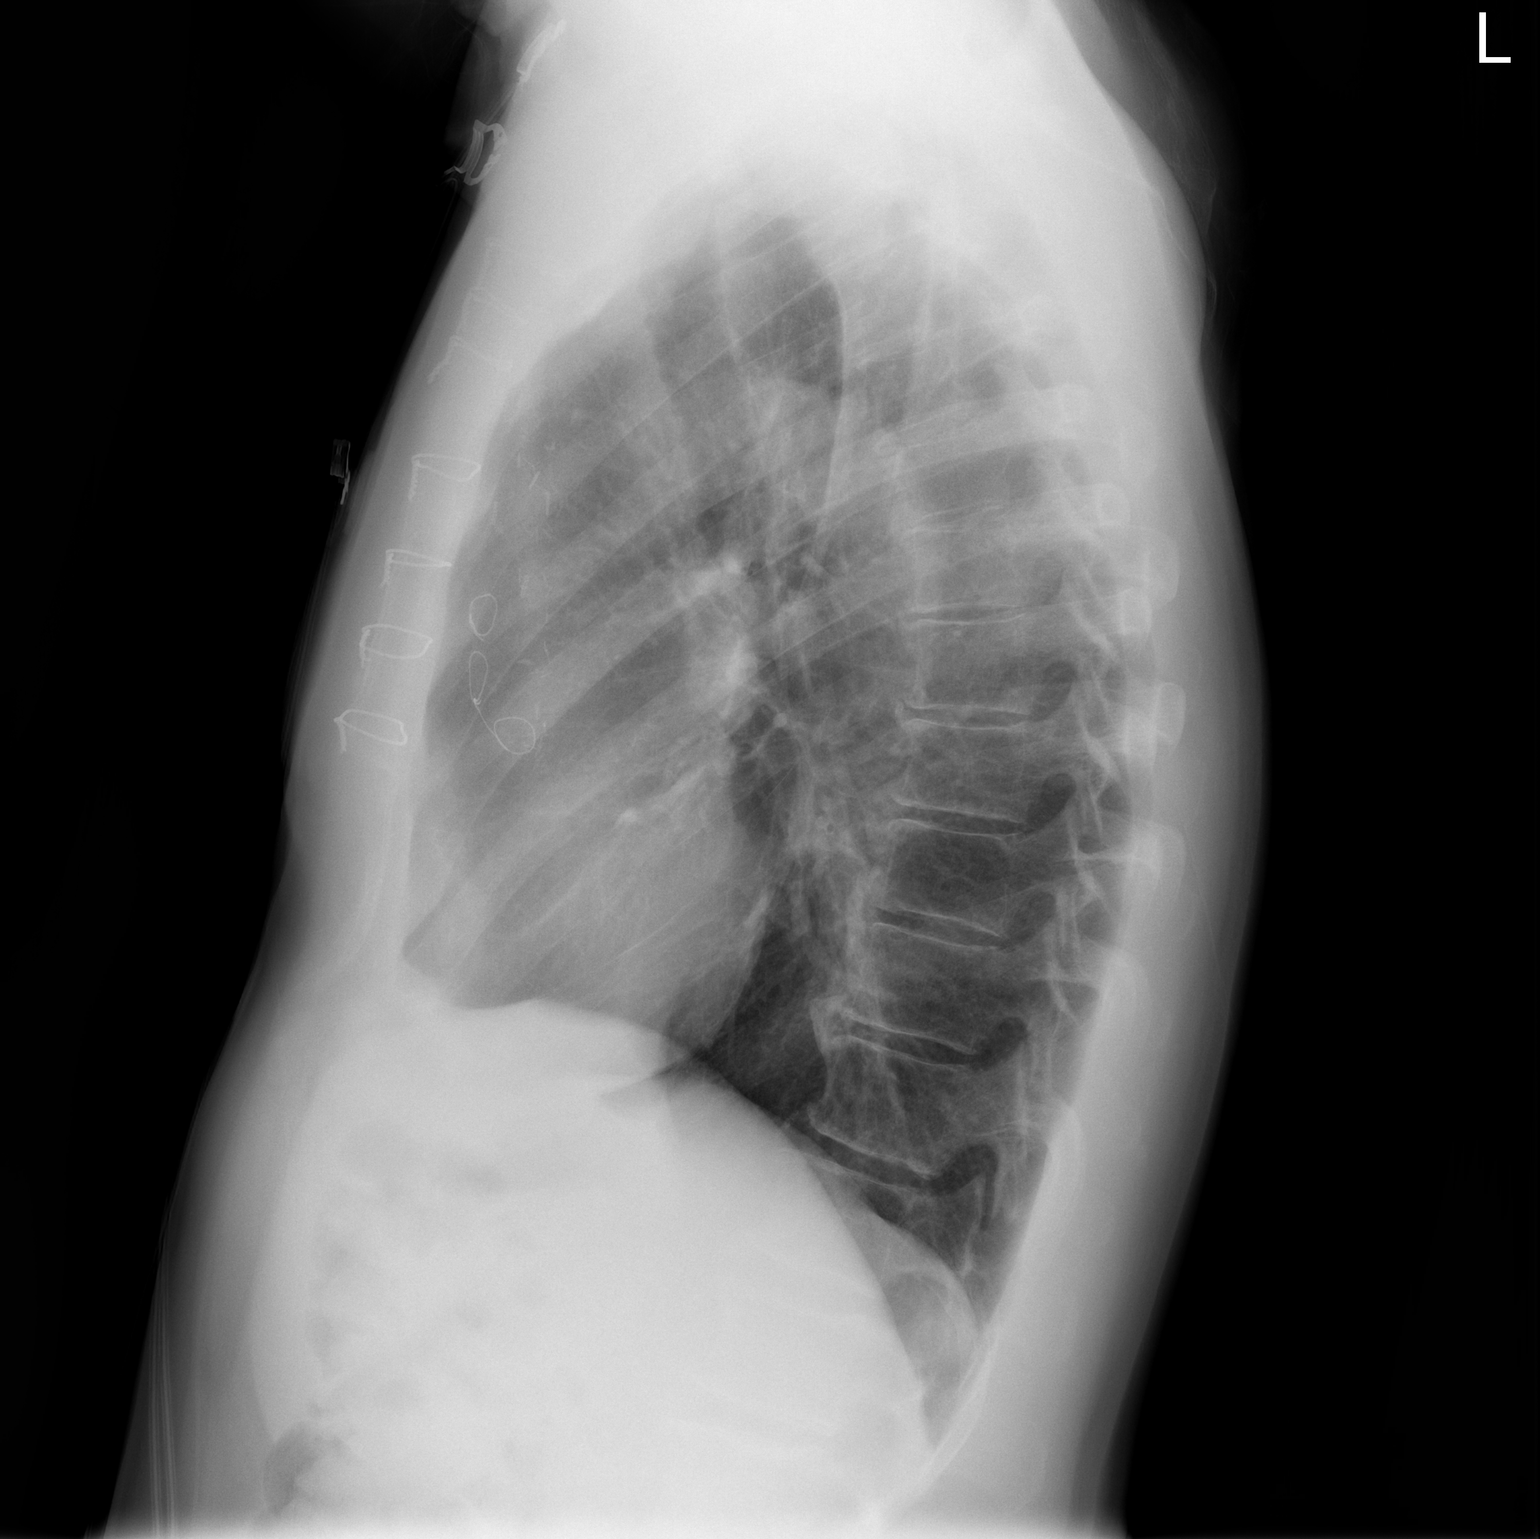

[2 of 2 positions shown; findings below may reference images not displayed]

FINDINGS: Median sternotomy wires are evident. No acute infiltrate or
effusion. Cardiomediastinal silhouette stable. No pneumothorax.
Degenerative changes of the spine.
IMPRESSION: No active cardiopulmonary disease.

## 2018-05-08 ENCOUNTER — Telehealth: Payer: Self-pay | Admitting: Interventional Cardiology

## 2018-05-08 NOTE — Progress Notes (Deleted)
Cardiology Office Note    Date:  05/08/2018   ID:  [name], DOB [birth year], MRN [id number]  PCP:  [name], MD  Cardiologist: Larae Grooms, MD  No chief complaint on file.   History of Present Illness:  [name] is a 62 y.o. male with history of CAD status post emergency CABG 2017, cath 10/05/2017 DES to the proximal RCA, history of CVA treated with TPA 10/2016, hypertension, anxiety.  Hospitalized 01/2018 with chest pain and palpitations and found to have ectopic atrial tachycardia and beta-blocker was titrated.  Continued chest pain though CT angios was negative for aortic syndrome.  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  Admission 02/2018 with question of syncope versus memory issues.  Telemetry without arrhythmia and echo EF 50% with anterior septal hypokinesis is unchanged..  30-day monitor was recommended.  Referral back to neurology for EEG to make sure he was not having absence-like seizure activity.  He did have some orthostatic hypotension.  Bilateral carotid Dopplers 40 to 49% bilateral ICA stenosis.  Patient called in yesterday because of elevated blood pressure 189/100 before he had taken any of his medications.  He took a nitroglycerin and blood pressure came down to 150/90 heart rate went from 1 10-90.  He has been instructed that he can take an extra lisinopril 5 mg if blood pressure is over 150.  Past Medical History:  Diagnosis Date  . Anxiety   . Basal cell carcinoma (BCC) of forehead   . CAD (coronary artery disease)    a. 10/2015 ant STEMI >> LHC with 3 v CAD; oLAD tx with POBA >> emergent CABG. b. Multiple evals since that time, early graft failure of SVG-RCA by cath 03/2016. c. 2/19 PCI/DES x1 to pRCA, normal EF.  Marland Kitchen Depression   . Dyspnea   . Ectopic atrial tachycardia ([place])   . Esophageal reflux    eosinophil esophagitis  . Family history of adverse reaction to anesthesia    "sister has PONV" (06/21/2017)  .  Former tobacco use   . Gout   . Hepatitis C    "treated and cured" (06/21/2017)  . High cholesterol   . Hypertension   . Ischemic cardiomyopathy    a. EF 25-30% at intraop TEE 4/17  //  b. Limited Echo 5/17 - EF 45-50%, mild ant HK. c. EF 55-65% by cath 09/2017.  . Migraine    "3-4/yr" (06/21/2017)  . Myocardial infarction ([place]) 10/2015  . Sinus bradycardia    a. HR dropping into 40s in 02/2016 -> BB reduced.  . Stroke ([facility]) 10/2016   "small one; sometimes my memory/cognitive issues" (06/21/2017)  . Symptomatic hypotension    a. 02/2016 ER visit -> meds reduced.    Past Surgical History:  Procedure Laterality Date  . BASAL CELL CARCINOMA EXCISION     "forehead  . CARDIAC CATHETERIZATION N/A 11/28/2015   Procedure: Left Heart Cath and Coronary Angiography;  Surgeon: [name], MD;  Location: [facility];  Service: Cardiovascular;  Laterality: N/A;  . CARDIAC CATHETERIZATION N/A 11/28/2015   Procedure: Coronary Balloon Angioplasty;  Surgeon: [name], MD;  Location: [facility];  Service: Cardiovascular;  Laterality: N/A;  ostial LAD  . CARDIAC CATHETERIZATION N/A 11/28/2015   Procedure: Coronary/Graft Angiography;  Surgeon: [name], MD;  Location: [facility];  Service: Cardiovascular;  Laterality: N/A;  coronaries only   . CARDIAC CATHETERIZATION N/A 04/21/2016   Procedure: Left  Heart Cath and Coronary Angiography;  Surgeon: Wellington Hampshire, MD;  Location: [facility];  Service: Cardiovascular;  Laterality: N/A;  . CARDIAC CATHETERIZATION N/A 06/14/2016   Procedure: Left Heart Cath and Cors/Grafts Angiography;  Surgeon: Lorretta Harp, MD;  Location: [facility];  Service: Cardiovascular;  Laterality: N/A;  . CARDIAC CATHETERIZATION N/A 09/08/2016   Procedure: Left Heart Cath and Cors/Grafts Angiography;  Surgeon: Wellington Hampshire, MD;  Location: [facility];  Service: Cardiovascular;  Laterality: N/A;  .  CARDIAC CATHETERIZATION    . CORONARY ARTERY BYPASS GRAFT N/A 11/28/2015   Procedure: CORONARY ARTERY BYPASS GRAFTING (CABG) TIMES FIVE USING LEFT INTERNAL MAMMARY ARTERY AND RIGHT GREATER SAPHENOUS,VIEN HARVEATED BY ENDOVIEN, INTRAOPPRATIVE TEE;  Surgeon: Gaye Pollack, MD;  Location: [facility];  Service: Open Heart Surgery;  Laterality: N/A;  . CORONARY STENT INTERVENTION N/A 10/05/2017   Procedure: CORONARY STENT INTERVENTION;  Surgeon: [name], MD;  Location: [facility];  Service: Cardiovascular;  Laterality: N/A;  . HUMERUS SURGERY Right 1969   "tumor inside bone; filled it w/bone chips"  . LEFT HEART CATH AND CORS/GRAFTS ANGIOGRAPHY N/A 03/11/2017   Procedure: Left Heart Cath and Cors/Grafts Angiography;  Surgeon: Leonie Man, MD;  Location: [facility];  Service: Cardiovascular;  Laterality: N/A;  . LEFT HEART CATH AND CORS/GRAFTS ANGIOGRAPHY N/A 10/05/2017   Procedure: LEFT HEART CATH AND CORS/GRAFTS ANGIOGRAPHY;  Surgeon: [name], MD;  Location: [facility];  Service: Cardiovascular;  Laterality: N/A;  . PERIPHERAL VASCULAR CATHETERIZATION N/A 06/14/2016   Procedure: Lower Extremity Angiography;  Surgeon: Lorretta Harp, MD;  Location: [facility];  Service: Cardiovascular;  Laterality: N/A;    Current Medications: No outpatient medications have been marked as taking for the 05/09/18 encounter (Appointment) with Imogene Burn, PA-C.     Allergies:   Prednisone; Tetanus toxoids; Wellbutrin [bupropion]; Morphine and related; Scallops [shellfish allergy]; and Chantix [varenicline]   Social History   Socioeconomic History  . Marital status: Married    Spouse name: [name]  . Number of children: 3  . Years of education: College  . Highest education level: Not on file  Occupational History  . Occupation: Scientist, research (physical sciences): SELF-EMPLOYED  Social Needs  . Financial resource strain: Not on file  . Food insecurity:     Worry: Not on file    Inability: Not on file  . Transportation needs:    Medical: Not on file    Non-medical: Not on file  Tobacco Use  . Smoking status: Former Smoker    Packs/day: 0.75    Years: 44.00    Pack years: 33.00    Types: Cigarettes    Last attempt to quit: 11/28/2015    Years since quitting: 2.4  . Smokeless tobacco: Never Used  Substance and Sexual Activity  . Alcohol use: No    Frequency: Never    Comment: 06/21/2017 "I'll have a few drinks q couple months"  . Drug use: No    Comment: 06/21/2017 "nothing since the 1980s"  . Sexual activity: Yes  Lifestyle  . Physical activity:    Days per week: Not on file    Minutes per session: Not on file  . Stress: Not on file  Relationships  . Social connections:    Talks on phone: Not on file    Gets together: Not on file    Attends religious service: Not on file  Active member of club or organization: Not on file    Attends meetings of clubs or organizations: Not on file    Relationship status: Not on file  Other Topics Concern  . Not on file  Social History Narrative   Patient lives at home with his spouse.   Caffeine Use: yes     Family History:  The patient's ***family history includes Heart Problems in his father; Heart attack in his maternal grandmother and paternal uncle; Heart attack (age of onset: 30) in his father; Heart failure in his father; Hypertension in his brother; Lung cancer in his mother; Stroke in his father and maternal grandmother.   ROS:   Please see the history of present illness.    ROS All other systems reviewed and are negative.   PHYSICAL EXAM:   VS:  There were no vitals taken for this visit.  Physical Exam  GEN: Well nourished, well developed, in no acute distress  HEENT: normal  Neck: no JVD, carotid bruits, or masses Cardiac:RRR; no murmurs, rubs, or gallops  Respiratory:  clear to auscultation bilaterally, normal work of breathing GI: soft, nontender, nondistended, +  BS Ext: without cyanosis, clubbing, or edema, Good distal pulses bilaterally MS: no deformity or atrophy  Skin: warm and dry, no rash Neuro:  Alert and Oriented x 3, Strength and sensation are intact Psych: euthymic mood, full affect  Wt Readings from Last 3 Encounters:  03/26/18 196 lb 6.4 oz (89.1 kg)  02/24/18 197 lb (89.4 kg)  02/09/18 192 lb 6.4 oz (87.3 kg)      Studies/Labs Reviewed:   EKG:  EKG is*** ordered today.  The ekg ordered today demonstrates ***  Recent Labs: 01/03/2018: ALT 15 02/07/2018: B Natriuretic Peptide 28.0; Magnesium 2.1; TSH 1.319 03/24/2018: Hemoglobin 14.9; Platelets 216 03/25/2018: BUN 16; Creatinine, Ser 1.16; Potassium 3.8; Sodium 139   Lipid Panel    Component Value Date/Time   CHOL 114 11/08/2017 0833   TRIG 93 11/08/2017 0833   HDL 52 11/08/2017 0833   CHOLHDL 2.2 11/08/2017 0833   CHOLHDL 2.2 06/22/2017 0256   VLDL 27 06/22/2017 0256   LDLCALC 43 11/08/2017 0833    Additional studies/ records that were reviewed today include:    Left heart cath 10/05/17:  Ost LAD to Prox LAD lesion is 50% stenosed.  Mid LAD lesion is 55% stenosed. LIMA to LAD is patent.  SVG to diagonal is occluded. There is no significant obstruction in the native diagonal.  Ost Cx lesion is 80% stenosed.  Prox Cx to Mid Cx lesion is 50% stenosed. SVG to OM is patent.  Dist RCA lesion is 50% stenosed.  RPDA lesion is 40% stenosed.  Ost RCA to Prox RCA lesion is 75% stenosed. Origin to Prox Graft lesion is 70% stenosed.  A drug-eluting stent was successfully placed using a STENT SYNERGY DES 4X16, postdilated to 4.5 mm.  Post intervention, there is a 0% residual stenosis.  The left ventricular systolic function is normal.  LV end diastolic pressure is normal.  The left ventricular ejection fraction is 55-65% by visual estimate.  There is no aortic valve stenosis.   Continue dual antiplatelet therapy for at least one year and, clopidogrel likely  indefinitely.   Aggressive secondary prevention.      2D echo 7/27/2019Study Conclusions   - Left ventricle: The cavity size was normal. Systolic function was   normal. The estimated ejection fraction was in the range of 60%   to 65%. Wall motion  was normal; there were no regional wall   motion abnormalities. Left ventricular diastolic function   parameters were normal. - Aortic valve: Trileaflet; mildly thickened, mildly calcified   leaflets. - Mitral valve: There was trivial regurgitation. - Atrial septum: There was increased thickness of the septum,   consistent with lipomatous hypertrophy. - Tricuspid valve: There was trivial regurgitation.     ASSESSMENT:    1. CAD of autologous artery bypass graft without angina   2. Ectopic atrial tachycardia ([place])   3. Ischemic cardiomyopathy   4. Essential hypertension   5. 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  Ectopic atrial tachycardia treated with increased beta-blocker  Ischemic cardiomyopathy ejection fraction 60 to 65% on most recent echo 03/25/2018  Essential hypertension blood pressure was up yesterday before he took his medications so he took a nitroglycerin to get it down.  He did have recent hospitalization with near syncope and had some orthostasis.  Hyperlipidemia   Medication Adjustments/Labs and Tests Ordered: Current medicines are reviewed at length with the patient today.  Concerns regarding medicines are outlined above.  Medication changes, Labs and Tests ordered today are listed in the Patient Instructions below. There are no Patient Instructions on file for this visit.   Sumner Boast, PA-C  05/08/2018 11:05 AM    [facility] [place], [place], [place]  [address] Phone: [phone]; Fax: [phone]

## 2018-05-08 NOTE — Telephone Encounter (Signed)
Returned call to patient. Patient states that this morning he woke up and went to walk his dog and when he came back he felt fatigued and SOB. He states that he checked his BP and it was 189/100 with HR 110. He took NTG x1 only because his BP was elevated. He states that his BP dropped to 90/60 HR 55 after the NTG. He states that his BP earlier was 150/90 HR 90s. Patient had not taken morning meds yet. He states that he constantly has "nuisance chest pain" located in the center of his chest and on the left side of his chest that he rates 2/10. He denies any additional Sx at this time. He takes ranexa 1000 mg BID, amlodipine 10 mg QD, lisinopril 5 mg QD, Toprol 50 mg AM, 25 mg PM. Patient can take additional 5 mg of lisinopril if SBP >150 (per Rx). Patient has not done this. Patient already has appointment scheduled for tomorrow. Instructed patient to continue to monitor BP and if SBP>150, take additional lisinopril. Instructed patient to avoid salt. Instructed patient to keep appointment tomorrow and let us know if his Sx change or worsen. Patient verbalized understanding and thanked me for the call.

## 2018-05-08 NOTE — Telephone Encounter (Signed)
New Message:      Pt c/o BP issue: STAT if pt c/o blurred vision, one-sided weakness or slurred speech  1. What are your last 5 BP readings? 189/100(am) pulse 110/ 103/76 (now) pulse 74  2. Are you having any other symptoms (ex. Dizziness, headache, blurred vision, passed out)? SOB, fatigue  3. What is your BP issue? Pt states his BP spiked up and is now having some moderate chest pain at the moment. Pt states he took a nitroglycerin and it has brought it down some but now he is just feeling a little fatigue.

## 2018-05-09 ENCOUNTER — Ambulatory Visit: Payer: Self-pay | Admitting: Physician Assistant

## 2018-05-09 ENCOUNTER — Telehealth: Payer: Self-pay | Admitting: Physician Assistant

## 2018-05-09 NOTE — Telephone Encounter (Signed)
New Message         Patient was unable to drive to his appointment today he states he is  not feeling well, nausea and light headed, just feels weird. Pls advise

## 2018-05-09 NOTE — Telephone Encounter (Signed)
Pt called to report that he woke up feeling light headed today. He has an appt with Estella Husk and for an event monitor but he says he doesn't feel comfortable driving. His BP is 110/80 and HR 78.[name] He says he only had fruit this morning which is a normal breakfast for him but I advised him to eat something more substantial to see if it helps. He denies any other symptoms such as chest pain, weakness, headache, palpitations. I advised him that he could call EMS to come and assess him but he declines. He said he just needs to cancel his appointments today and he will call back to reschedule and will let us know how he feels later in the day.

## 2018-05-15 ENCOUNTER — Emergency Department (HOSPITAL_COMMUNITY)
Admission: EM | Admit: 2018-05-15 | Discharge: 2018-05-15 | Disposition: A | Discharge status: Home / Self Care | Payer: BC Managed Care – PPO | Attending: Emergency Medicine | Admitting: Emergency Medicine

## 2018-05-15 ENCOUNTER — Emergency Department (HOSPITAL_COMMUNITY): Payer: BC Managed Care – PPO

## 2018-05-15 ENCOUNTER — Encounter (HOSPITAL_COMMUNITY): Payer: Self-pay

## 2018-05-15 ENCOUNTER — Other Ambulatory Visit: Payer: Self-pay

## 2018-05-15 DIAGNOSIS — Z79899 Other long term (current) drug therapy: Secondary | ICD-10-CM | POA: Insufficient documentation

## 2018-05-15 DIAGNOSIS — Z87891 Personal history of nicotine dependence: Secondary | ICD-10-CM | POA: Insufficient documentation

## 2018-05-15 DIAGNOSIS — I252 Old myocardial infarction: Secondary | ICD-10-CM | POA: Insufficient documentation

## 2018-05-15 DIAGNOSIS — R0789 Other chest pain: Secondary | ICD-10-CM | POA: Diagnosis not present

## 2018-05-15 DIAGNOSIS — Z7982 Long term (current) use of aspirin: Secondary | ICD-10-CM | POA: Insufficient documentation

## 2018-05-15 DIAGNOSIS — I2510 Atherosclerotic heart disease of native coronary artery without angina pectoris: Secondary | ICD-10-CM | POA: Insufficient documentation

## 2018-05-15 DIAGNOSIS — R079 Chest pain, unspecified: Secondary | ICD-10-CM | POA: Diagnosis present

## 2018-05-15 DIAGNOSIS — Z7901 Long term (current) use of anticoagulants: Secondary | ICD-10-CM | POA: Diagnosis not present

## 2018-05-15 DIAGNOSIS — I10 Essential (primary) hypertension: Secondary | ICD-10-CM | POA: Diagnosis not present

## 2018-05-15 LAB — CBC
HCT: 42.1 % (ref 39.0–52.0)
Hemoglobin: 13.7 g/dL (ref 13.0–17.0)
MCH: 30.3 pg (ref 26.0–34.0)
MCHC: 32.5 g/dL (ref 30.0–36.0)
MCV: 93.1 fL (ref 78.0–100.0)
Platelets: 209 10*3/uL (ref 150–400)
RBC: 4.52 MIL/uL (ref 4.22–5.81)
RDW: 12.6 % (ref 11.5–15.5)
WBC: 12 10*3/uL — ABNORMAL HIGH (ref 4.0–10.5)

## 2018-05-15 LAB — I-STAT TROPONIN, ED
Troponin i, poc: 0 ng/mL (ref 0.00–0.08)
Troponin i, poc: 0 ng/mL (ref 0.00–0.08)

## 2018-05-15 LAB — HEPATIC FUNCTION PANEL
ALT: 25 U/L (ref 0–44)
AST: 20 U/L (ref 15–41)
Albumin: 4.2 g/dL (ref 3.5–5.0)
Alkaline Phosphatase: 63 U/L (ref 38–126)
Bilirubin, Direct: 0.2 mg/dL (ref 0.0–0.2)
Indirect Bilirubin: 0.5 mg/dL (ref 0.3–0.9)
Total Bilirubin: 0.7 mg/dL (ref 0.3–1.2)
Total Protein: 6.9 g/dL (ref 6.5–8.1)

## 2018-05-15 LAB — BASIC METABOLIC PANEL
Anion gap: 12 (ref 5–15)
BUN: 15 mg/dL (ref 8–23)
CO2: 25 mmol/L (ref 22–32)
Calcium: 9.6 mg/dL (ref 8.9–10.3)
Chloride: 103 mmol/L (ref 98–111)
Creatinine, Ser: 1.07 mg/dL (ref 0.61–1.24)
GFR calc Af Amer: >60 mL/min (ref >60)
GFR calc non Af Amer: >60 mL/min (ref >60)
Glucose, Bld: 92 mg/dL (ref 70–99)
Potassium: 3.8 mmol/L (ref 3.5–5.1)
Sodium: 140 mmol/L (ref 135–145)

## 2018-05-15 LAB — D-DIMER, QUANTITATIVE: D-Dimer, Quant: <0.27 ug/mL-FEU (ref 0.00–0.50)

## 2018-05-15 MED ORDER — FENTANYL CITRATE (PF) 100 MCG/2ML IJ SOLN
100.0000 ug | Freq: Once | INTRAMUSCULAR | Status: AC
  Administered 2018-05-15: 100 ug via INTRAVENOUS
  Filled 2018-05-15: qty 2

## 2018-05-15 MED ORDER — ONDANSETRON HCL 4 MG/2ML IJ SOLN
4.0000 mg | Freq: Once | INTRAMUSCULAR | Status: AC
  Administered 2018-05-15: 4 mg via INTRAVENOUS
  Filled 2018-05-15: qty 2

## 2018-05-15 NOTE — Discharge Instructions (Signed)
Please read and follow all provided instructions.  Your diagnoses today include:  1. Atypical chest pain     Tests performed today include:  An EKG of your heart  A chest x-ray  Cardiac enzymes - a blood test for heart muscle damage  Blood counts and electrolytes  D-dimer - test for blood clot  Vital signs. See below for your results today.   Medications prescribed:   None  Take any prescribed medications only as directed.  Follow-up instructions: Please follow-up with your primary care provider as soon as you can for further evaluation of your symptoms.   Return instructions:  SEEK IMMEDIATE MEDICAL ATTENTION IF:  You have severe chest pain, especially if the pain is crushing or pressure-like and spreads to the arms, back, neck, or jaw, or if you have sweating, nausea (feeling sick to your stomach), or shortness of breath. THIS IS AN EMERGENCY. Don't wait to see if the pain will go away. Get medical help at once. Call 911 or 0 (operator). DO NOT drive yourself to the hospital.   Your chest pain gets worse and does not go away with rest.   You have an attack of chest pain lasting longer than usual, despite rest and treatment with the medications your caregiver has prescribed.   You wake from sleep with chest pain or shortness of breath.  You feel dizzy or faint.  You have chest pain not typical of your usual pain for which you originally saw your caregiver.   You have any other emergent concerns regarding your health.  Additional Information: Chest pain comes from many different causes. Your caregiver has diagnosed you as having chest pain that is not specific for one problem, but does not require admission.  You are at low risk for an acute heart condition or other serious illness.   Your vital signs today were: BP 125/78    Pulse 77    Temp 99.1 F (37.3 C) (Oral)    Resp 20    Ht 5\' 9"  (1.753 m)    Wt 86.2 kg    SpO2 99%    BMI 28.06 kg/m  If your blood  pressure (BP) was elevated above 135/85 this visit, please have this repeated by your doctor within one month. --------------

## 2018-05-15 NOTE — ED Notes (Signed)
Called lab to add on d ddimer

## 2018-05-15 NOTE — ED Triage Notes (Signed)
Per ems pt c/o right side CP radiating to shoulder and neck 7/10. Pain comes and goes. Pt has cardiac history including 5 way bypass

## 2018-05-15 NOTE — ED Notes (Signed)
Sent rainbow to lab. Called lab to add on hep func panel

## 2018-05-15 NOTE — ED Provider Notes (Signed)
[facility] EMERGENCY DEPARTMENT Provider Note   CSN: [id number] Arrival date & time: 05/15/18  1723     History   Chief Complaint Chief Complaint  Patient presents with  . Chest Pain    HPI [name] is a 62 y.o. male.  Patient with history of CABG presents today with complaint of right-sided chest pain with radiation to the right back, right shoulder, and right neck.  Symptoms started about 1 PM.  Prior to this patient was feeling normal.  He developed some lightheadedness prior.  Constant pain is about 2 out of 10 and increases to 7 out of 10 with radiation episodes.  No associated shortness of breath or diaphoresis.  Known vomiting with patient has been nauseous.  No weakness, numbness, or tingling in the arms or the legs.  No visual changes or other strokelike symptoms.  No recent fevers or cough.  Patient states that his current symptoms feel different than previous MI and is not as intense.  The onset of this condition was acute. The course is constant. Aggravating factors: none. Alleviating factors: none.       Past Medical History:  Diagnosis Date  . Anxiety   . Basal cell carcinoma (BCC) of forehead   . CAD (coronary artery disease)    a. 10/2015 ant STEMI >> LHC with 3 v CAD; oLAD tx with POBA >> emergent CABG. b. Multiple evals since that time, early graft failure of SVG-RCA by cath 03/2016. c. 2/19 PCI/DES x1 to pRCA, normal EF.  Marland Kitchen Depression   . Dyspnea   . Ectopic atrial tachycardia ([place])   . Esophageal reflux    eosinophil esophagitis  . Family history of adverse reaction to anesthesia    "sister has PONV" (06/21/2017)  . Former tobacco use   . Gout   . Hepatitis C    "treated and cured" (06/21/2017)  . High cholesterol   . Hypertension   . Ischemic cardiomyopathy    a. EF 25-30% at intraop TEE 4/17  //  b. Limited Echo 5/17 - EF 45-50%, mild ant HK. c. EF 55-65% by cath 09/2017.  . Migraine    "3-4/yr" (06/21/2017)  . Myocardial  infarction ([place]) 10/2015  . Sinus bradycardia    a. HR dropping into 40s in 02/2016 -> BB reduced.  . Stroke ([place]) 10/2016   "small one; sometimes my memory/cognitive issues" (06/21/2017)  . Symptomatic hypotension    a. 02/2016 ER visit -> meds reduced.    Patient Active Problem List   Diagnosis Date Noted  . Transient loss of consciousness 03/24/2018  . Ectopic atrial tachycardia ([place]) 02/09/2018  . Chest pain 03/10/2017  . Family hx-stroke 11/10/2016  . Stroke-like episode ([place]) - R brain, s/p tPA 11/09/2016  . Unstable angina ([place]) 09/07/2016  . Claudication of both lower extremities ([place])   . Pure hypercholesterolemia   . Tobacco abuse disorder   . CAD of autologous artery bypass graft without angina   . Chest pain at rest 06/10/2016  . Abnormal nuclear stress test - HIGH RISK 04/20/2016  . Old MI (myocardial infarction)   . Essential hypertension 02/26/2016  . Ischemic cardiomyopathy 12/25/2015  . Hyperlipidemia LDL goal <70 12/25/2015  . 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 Coronary artery disease involving native coronary artery of native heart with unstable angina pectoris ([place]) 11/28/2015  . 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 Acute MI anterior wall first episode care [facility])   . Chest pain  with high risk for cardiac etiology 03/07/2015  . Dysphagia 03/07/2015  . Gout attack 03/07/2015  . Mixed bipolar I disorder ([place]) 03/07/2015  . Fibromyalgia 07/09/2014  . Gout 07/09/2014  . Anxiety 07/09/2014  . Depression 07/09/2014  . Hepatitis C 11/20/2012  . Eosinophilic esophagitis 29/47/6546    Past Surgical History:  Procedure Laterality Date  . BASAL CELL CARCINOMA EXCISION     "forehead  . CARDIAC CATHETERIZATION N/A 11/28/2015   Procedure: Left Heart Cath and Coronary Angiography;  Surgeon: [name], MD;  Location: [facility] CV LAB;  Service: Cardiovascular;  Laterality: N/A;  . CARDIAC CATHETERIZATION N/A 11/28/2015   Procedure: Coronary Balloon  Angioplasty;  Surgeon: [name], MD;  Location: [place] [facility];  Service: Cardiovascular;  Laterality: N/A;  ostial LAD  . CARDIAC CATHETERIZATION N/A 11/28/2015   Procedure: Coronary/Graft Angiography;  Surgeon: [name], MD;  Location: [place];  Service: Cardiovascular;  Laterality: N/A;  coronaries only   . CARDIAC CATHETERIZATION N/A 04/21/2016   Procedure: Left Heart Cath and Coronary Angiography;  Surgeon: Wellington Hampshire, MD;  Location: [place] CV LAB;  Service: Cardiovascular;  Laterality: N/A;  . CARDIAC CATHETERIZATION N/A 06/14/2016   Procedure: Left Heart Cath and Cors/Grafts Angiography;  Surgeon: Lorretta Harp, MD;  Location: [facility];  Service: Cardiovascular;  Laterality: N/A;  . CARDIAC CATHETERIZATION N/A 09/08/2016   Procedure: Left Heart Cath and Cors/Grafts Angiography;  Surgeon: Wellington Hampshire, MD;  Location: [place] [facility];  Service: Cardiovascular;  Laterality: N/A;  . CARDIAC CATHETERIZATION    . CORONARY ARTERY BYPASS GRAFT N/A 11/28/2015   Procedure: CORONARY ARTERY BYPASS GRAFTING (CABG) TIMES FIVE USING LEFT INTERNAL MAMMARY ARTERY AND RIGHT GREATER SAPHENOUS,VIEN HARVEATED BY ENDOVIEN, INTRAOPPRATIVE TEE;  Surgeon: Gaye Pollack, MD;  Location: [facility];  Service: Open Heart Surgery;  Laterality: N/A;  . CORONARY STENT INTERVENTION N/A 10/05/2017   Procedure: CORONARY STENT INTERVENTION;  Surgeon: [name], MD;  Location: [place] [facility] LAB;  Service: Cardiovascular;  Laterality: N/A;  . HUMERUS SURGERY Right 1969   "tumor inside bone; filled it w/bone chips"  . LEFT HEART CATH AND CORS/GRAFTS ANGIOGRAPHY N/A 03/11/2017   Procedure: Left Heart Cath and Cors/Grafts Angiography;  Surgeon: Leonie Man, MD;  Location: [place] [facility];  Service: Cardiovascular;  Laterality: N/A;  . LEFT HEART CATH AND CORS/GRAFTS ANGIOGRAPHY N/A 10/05/2017   Procedure: LEFT HEART CATH AND CORS/GRAFTS ANGIOGRAPHY;   Surgeon: [name], MD;  Location: [place] CV LAB;  Service: Cardiovascular;  Laterality: N/A;  . PERIPHERAL VASCULAR CATHETERIZATION N/A 06/14/2016   Procedure: Lower Extremity Angiography;  Surgeon: Lorretta Harp, MD;  Location: [facility] LAB;  Service: Cardiovascular;  Laterality: N/A;        Home Medications    Prior to Admission medications   Medication Sig Start Date End Date Taking? Authorizing Provider  acetaminophen (TYLENOL) 500 MG tablet Take 1,000 mg by mouth every 6 (six) hours as needed for mild pain or headache.     [provider]  amLODipine (NORVASC) 10 MG tablet Take 1 tablet (10 mg total) by mouth daily. 06/22/17   Strader, Fransisco Hertz, PA-C  aspirin 81 MG tablet Take 81 mg by mouth daily.    [provider]  atorvastatin (LIPITOR) 80 MG tablet Take 1 tablet (80 mg total) by mouth daily. 08/16/17   [name], MD  chlordiazePOXIDE (LIBRIUM) 10 MG  capsule Take 10 mg by mouth 4 (four) times daily as needed for anxiety.     [provider]  clopidogrel (PLAVIX) 75 MG tablet take 1 tablet by mouth once daily 01/25/17   [name] T, PA-C  fluticasone [facility]) 50 MCG/ACT nasal spray Place 1 spray into both nostrils as needed for allergies.  05/26/17   [provider]  lisinopril (PRINIVIL,ZESTRIL) 5 MG tablet Take 5 mg by mouth daily. May take 1 additional tablet once daily for SBP greater than 150 once hour after taking daily dose Patient taking differently: Take 5 mg by mouth daily. Take 5 mg by mouth daily. May take 1 additional tablet once daily for SBP greater than 150 once hour after taking daily dose 01/18/18   [name], MD  metoprolol succinate (TOPROL XL) 50 MG 24 hr tablet Take 1 tablet (50 mg total) by mouth as directed. 50 mg in the AM and 25 mg in the PM 03/08/18   [name] T, PA-C  Multiple Vitamins-Minerals (ONE-A-DAY MENS 50+ ADVANTAGE PO) Take 1 tablet by mouth daily.      [provider]  nitroGLYCERIN (NITROSTAT) 0.4 MG SL tablet Place 1 tablet (0.4 mg total) under the tongue every 5 (five) minutes as needed for chest pain. 3 DOSE MAX 06/22/17   Strader, [place] M, PA-C  pantoprazole (PROTONIX) 40 MG tablet Take 40 mg by mouth daily.    [provider]  ranitidine (ZANTAC) 150 MG tablet Take 150 mg by mouth as needed for heartburn.    [provider]  ranolazine (RANEXA) 1000 MG SR tablet Take 1 tablet (1,000 mg total) by mouth 2 (two) times daily. 02/24/18   [name] T, PA-C  terbinafine (LAMISIL) 1 % cream Apply topically 2 (two) times daily. 03/26/18   Shahmehdi, Valeria Batman, MD  traZODone (DESYREL) 50 MG tablet Take 50-150 mg by mouth at bedtime as needed for sleep.  12/08/16   [provider]    Family History Family History  Problem Relation Age of Onset  . Lung cancer Mother   . Heart Problems Father   . Heart attack Father 61  . Stroke Father   . Heart failure Father   . Heart attack Maternal Grandmother   . Stroke Maternal Grandmother   . Heart attack Paternal Uncle   . Hypertension Brother   . Autoimmune disease Neg Hx     Social History Social History   Tobacco Use  . Smoking status: Former Smoker    Packs/day: 0.75    Years: 44.00    Pack years: 33.00    Types: Cigarettes    Last attempt to quit: 11/28/2015    Years since quitting: 2.4  . Smokeless tobacco: Never Used  Substance Use Topics  . Alcohol use: No    Frequency: Never    Comment: 06/21/2017 "I'll have a few drinks q couple months"  . Drug use: No    Comment: 06/21/2017 "nothing since the 1980s"     Allergies   Prednisone; Tetanus toxoids; Wellbutrin [bupropion]; Morphine and related; Scallops [shellfish allergy]; and Chantix [varenicline]   Review of Systems Review of Systems  Constitutional: Negative for diaphoresis and fever.  Eyes: Negative for redness.  Respiratory: Negative for cough and shortness of breath.     Cardiovascular: Positive for chest pain. Negative for palpitations and leg swelling.  Gastrointestinal: Positive for nausea. Negative for abdominal pain and vomiting.  Genitourinary: Negative for dysuria.  Musculoskeletal: Positive for back pain and  neck pain.  Skin: Negative for rash.  Neurological: Negative for syncope and light-headedness.  Psychiatric/Behavioral: The patient is not nervous/anxious.      Physical Exam Updated Vital Signs BP 135/80   Pulse 86   Temp 99.1 F (37.3 C) (Oral)   Resp 15   Ht 5\' 9"  (1.753 m)   Wt 86.2 kg   SpO2 100%   BMI 28.06 kg/m   Physical Exam  Constitutional: He appears well-developed and well-nourished.  HENT:  Head: Normocephalic and atraumatic.  Mouth/Throat: Mucous membranes are normal. Mucous membranes are not dry.  Eyes: Conjunctivae are normal.  Neck: Trachea normal and normal range of motion. Neck supple. Normal carotid pulses and no JVD present. No muscular tenderness present. Carotid bruit is not present. No tracheal deviation present.  Cardiovascular: Normal rate, regular rhythm, S1 normal, S2 normal, normal heart sounds and intact distal pulses. Exam reveals no distant heart sounds and no decreased pulses.  No murmur heard. Pulmonary/Chest: Effort normal and breath sounds normal. No respiratory distress. He has no wheezes. He exhibits no tenderness.  Abdominal: Soft. Normal aorta and bowel sounds are normal. There is tenderness (mild epigastric). There is no rebound and no guarding.  Musculoskeletal: He exhibits no edema.  Neurological: He is alert.  Skin: Skin is warm and dry. He is not diaphoretic. No cyanosis. No pallor.  Psychiatric: He has a normal mood and affect.  Nursing note and vitals reviewed.    ED Treatments / Results  Labs (all labs ordered are listed, but only abnormal results are displayed) Labs Reviewed  CBC - Abnormal; Notable for the following components:      Result Value   WBC 12.0 (*)    All  other components within normal limits  BASIC METABOLIC PANEL  HEPATIC FUNCTION PANEL  D-DIMER, QUANTITATIVE (NOT AT [facility])  I-STAT TROPONIN, ED  I-STAT TROPONIN, ED    EKG None  Radiology No results found.  Procedures Procedures (including critical care time)  Medications Ordered in ED Medications  ondansetron (ZOFRAN) injection 4 mg (has no administration in time range)  fentaNYL (SUBLIMAZE) injection 100 mcg (has no administration in time range)     Initial Impression / Assessment and Plan / ED Course  I have reviewed the triage vital signs and the nursing notes.  Pertinent labs & imaging results that were available during my care of the patient were reviewed by me and considered in my medical decision making (see chart for details).     Patient seen and examined. EKG reviewed. No ischemic changes. Work-up initiated. Medications ordered.   Vital signs reviewed and are as follows: BP 135/80   Pulse 86   Temp 99.1 F (37.3 C) (Oral)   Resp 15   Ht 5\' 9"  (1.753 m)   Wt 86.2 kg   SpO2 100%   BMI 28.06 kg/m   Patient seen by Dr. Wilson Singer. Added d-dimer.   RN came to me stating that if we were sending him home anyway, patient requesting discharge.  I discussed with RN that we were still waiting for d-dimer and troponin.  She states that she would relate this to the patient.  Several minutes later she came back stating that the patient will stay.  D-dimer and second troponin were negative.  I went to discuss this with the patient.  He states that he is feeling better however was concerned about his blood pressure reading.  It was approximately 150/130 in the room.  Wife also states that when  he goes to stand up, his heart rate goes up to 130.  Right now, heart rate 70.  Patient appears relaxed.  Offered recheck of blood pressure manually as well as orthostatic vital signs.  Patient and wife are in agreement with this.  10:21 PM I went to check the results of the recheck of  blood pressure and orthostatics and patient has been discharged from the emergency department.  I was not able to discuss return precautions with patient directly as he is no longer in the emergency department.  Final Clinical Impressions(s) / ED Diagnoses   Final diagnoses:  Atypical chest pain   Patient with history of CABG with chest pain today that is atypical and different than his previous chest pain.  Pain radiated to the right shoulder and back.  Troponin 0.002.  EKG with normal sinus rhythm, nonischemic and no changes.  D-dimer is negative.  Chest x-ray is clear.  Low concern for ACS, dissection, PE at this time given work-up.  ED Discharge Orders    None       Carlisle Cater, Hershal Coria 05/15/18 2223    Virgel Manifold, MD 05/25/18 [phone]

## 2018-05-16 ENCOUNTER — Telehealth: Payer: Self-pay | Admitting: Interventional Cardiology

## 2018-05-16 NOTE — Telephone Encounter (Signed)
New message  Pt c/o of Chest Pain: STAT if CP now or developed within 24 hours  1. Are you having CP right now? yes  2. Are you experiencing any other symptoms (ex. SOB, nausea, vomiting, sweating)? Sweating, fever   3. How long have you been experiencing CP? Since yesterday  4. Is your CP continuous or coming and going? Coming and going   5. Have you taken Nitroglycerin?no  ?

## 2018-05-16 NOTE — Telephone Encounter (Signed)
Spoke with pt this morning who has c/o ongoing right sided chest pain and is diaphoretic. Pt visited the ED yesterday with similar complaints. All troponin, d-dimer, cxr, and EKG were negative for cardiac complications. Pt has slightly elevated WBC, all other labs were normal. Pt states he's been feeling "warm and sweaty." He was concerned with htn and tachycardia while ambulating noted on his monitor while in his ED room. Pt states his CP just "comes on" and does not know what relieves it. He has not tried nitro to relieve his CP. Pt was nervous the ED "just let him leave" with CP. Per ED notes, pt was discharged after negative cardiac work up.   After reviewing pt's ED visit notes, labs, and imaging I have advised pt to speak with his PCP. Elevated WBC, fever, diaphoresis, and tachycardia could be indications of viral or bacterial infection. I have advised pt if his CP becomes worse, unrelieved, has SOB, or any other s/s of CP, he is to seek help from the ED for further evaluation. I advised pt if his PCP believes he needs to be seen in our office, we can try to arrange this for him. Pt has agreed with this plan and will contact his PCP today.

## 2018-05-26 ENCOUNTER — Other Ambulatory Visit: Payer: Self-pay | Admitting: Physician Assistant

## 2018-05-26 DIAGNOSIS — I255 Ischemic cardiomyopathy: Secondary | ICD-10-CM

## 2018-05-26 DIAGNOSIS — I2510 Atherosclerotic heart disease of native coronary artery without angina pectoris: Secondary | ICD-10-CM

## 2018-06-09 ENCOUNTER — Emergency Department (HOSPITAL_COMMUNITY): Payer: BC Managed Care – PPO

## 2018-06-09 ENCOUNTER — Encounter (HOSPITAL_COMMUNITY): Payer: Self-pay | Admitting: Emergency Medicine

## 2018-06-09 ENCOUNTER — Emergency Department (HOSPITAL_COMMUNITY)
Admission: EM | Admit: 2018-06-09 | Discharge: 2018-06-09 | Disposition: A | Discharge status: Home / Self Care | Payer: BC Managed Care – PPO | Attending: Emergency Medicine | Admitting: Emergency Medicine

## 2018-06-09 ENCOUNTER — Telehealth: Payer: Self-pay | Admitting: Physician Assistant

## 2018-06-09 DIAGNOSIS — Z7902 Long term (current) use of antithrombotics/antiplatelets: Secondary | ICD-10-CM | POA: Diagnosis not present

## 2018-06-09 DIAGNOSIS — Z79899 Other long term (current) drug therapy: Secondary | ICD-10-CM | POA: Diagnosis not present

## 2018-06-09 DIAGNOSIS — I25708 Atherosclerosis of coronary artery bypass graft(s), unspecified, with other forms of angina pectoris: Secondary | ICD-10-CM

## 2018-06-09 DIAGNOSIS — R002 Palpitations: Secondary | ICD-10-CM

## 2018-06-09 DIAGNOSIS — R0602 Shortness of breath: Secondary | ICD-10-CM | POA: Insufficient documentation

## 2018-06-09 DIAGNOSIS — Z7982 Long term (current) use of aspirin: Secondary | ICD-10-CM | POA: Insufficient documentation

## 2018-06-09 DIAGNOSIS — I2510 Atherosclerotic heart disease of native coronary artery without angina pectoris: Secondary | ICD-10-CM | POA: Diagnosis not present

## 2018-06-09 DIAGNOSIS — Z87891 Personal history of nicotine dependence: Secondary | ICD-10-CM | POA: Diagnosis not present

## 2018-06-09 DIAGNOSIS — I10 Essential (primary) hypertension: Secondary | ICD-10-CM | POA: Insufficient documentation

## 2018-06-09 DIAGNOSIS — R079 Chest pain, unspecified: Secondary | ICD-10-CM

## 2018-06-09 DIAGNOSIS — I25709 Atherosclerosis of coronary artery bypass graft(s), unspecified, with unspecified angina pectoris: Secondary | ICD-10-CM | POA: Insufficient documentation

## 2018-06-09 LAB — BASIC METABOLIC PANEL
Anion gap: 7 (ref 5–15)
BUN: 20 mg/dL (ref 8–23)
CO2: 24 mmol/L (ref 22–32)
Calcium: 9.6 mg/dL (ref 8.9–10.3)
Chloride: 106 mmol/L (ref 98–111)
Creatinine, Ser: 1.08 mg/dL (ref 0.61–1.24)
GFR calc Af Amer: >60 mL/min (ref >60)
GFR calc non Af Amer: >60 mL/min (ref >60)
Glucose, Bld: 99 mg/dL (ref 70–99)
Potassium: 4.7 mmol/L (ref 3.5–5.1)
Sodium: 137 mmol/L (ref 135–145)

## 2018-06-09 LAB — CBC
HCT: 43.8 % (ref 39.0–52.0)
Hemoglobin: 13.6 g/dL (ref 13.0–17.0)
MCH: 29.1 pg (ref 26.0–34.0)
MCHC: 31.1 g/dL (ref 30.0–36.0)
MCV: 93.6 fL (ref 80.0–100.0)
Platelets: 221 10*3/uL (ref 150–400)
RBC: 4.68 MIL/uL (ref 4.22–5.81)
RDW: 12.5 % (ref 11.5–15.5)
WBC: 8 10*3/uL (ref 4.0–10.5)
nRBC: 0 % (ref 0.0–0.2)

## 2018-06-09 LAB — BRAIN NATRIURETIC PEPTIDE: B Natriuretic Peptide: 18.5 pg/mL (ref 0.0–100.0)

## 2018-06-09 LAB — I-STAT TROPONIN, ED
Troponin i, poc: 0 ng/mL (ref 0.00–0.08)
Troponin i, poc: 0 ng/mL (ref 0.00–0.08)

## 2018-06-09 MED ORDER — FENTANYL CITRATE (PF) 100 MCG/2ML IJ SOLN
50.0000 ug | Freq: Once | INTRAMUSCULAR | Status: AC
  Administered 2018-06-09: 50 ug via INTRAVENOUS
  Filled 2018-06-09: qty 2

## 2018-06-09 MED ORDER — OXYCODONE-ACETAMINOPHEN 5-325 MG PO TABS
1.0000 | ORAL_TABLET | Freq: Once | ORAL | Status: AC
  Administered 2018-06-09: 1 via ORAL
  Filled 2018-06-09: qty 1

## 2018-06-09 NOTE — ED Notes (Signed)
Cardiology consult at bedside.

## 2018-06-09 NOTE — ED Provider Notes (Signed)
[facility] EMERGENCY DEPARTMENT Provider Note   CSN: [id number] Arrival date & time: 06/09/18  7829     History   Chief Complaint Chief Complaint  Patient presents with  . Chest Pain    HPI [name] is a 62 y.o. male with a history of CAD status post anterior MI in March 2017 treated with emergent CABG, systolic heart failure secondary to ischemic cardiomyopathy, prior right brain stroke treated with TPA in March 2018, hypertension, hyperlipidemia who is followed by Dr.Varanasi who presents emergency department today for chest pain and shortness of breath.  Patient reports that he was awoken at 2 AM this morning by a text message from his credit card company.  He had a hard time falling back asleep and toss and turn until 5 AM.  He got out of bed to let his dogs out and when walking back up the stairs became very short of breath and lightheaded.  He took his blood pressure noted to be severely elevated and his heart rate was elevated as well.  He went to lie down and around 730 he started developing a chest pain that was located substernal and radiated to his left scapula, was associated with shortness of breath as well as nausea.  He describes this as a cramping/aching/heaviness sensation.  He reports it is waxed and waned since onset it typically worsening for 20-30 minutes.  He has received 2 doses of nitroglycerin that he states provides some relief for approximately 15 minutes before the pain increases again.  He reports the pain is not worsened with exertion.  He describes this is similar to the heart attack pain he has had in the past however "not as severe".  Patient notes that he was here last month for chest pain that was different.  He states he was seen shortly after that and diagnosed with bronchitis and pleurisy and is taking antibiotics for relief without chest pain.  Patient contacted his cardiology office today and was told to come in for further  evaluation.  Patient denies any fever.  He notes ongoing dry cough since last month.  No lower extremity swelling, recent travel, recent surgeries, current history/treatment of cancer, hemoptysis, abdominal pain, diaphoresis or emesis.  The pain does not radiate to his neck or jaw. Patient received 324mg  of ASA PTA.   HPI  Past Medical History:  Diagnosis Date  . Anxiety   . Basal cell carcinoma (BCC) of forehead   . CAD (coronary artery disease)    a. 10/2015 ant STEMI >> LHC with 3 v CAD; oLAD tx with POBA >> emergent CABG. b. Multiple evals since that time, early graft failure of SVG-RCA by cath 03/2016. c. 2/19 PCI/DES x1 to pRCA, normal EF.  Mar[place] Kitchen Depression   . Dyspnea   . Ectopic atrial tachycardia ([place])   . Esophageal reflux    eosinophil esophagitis  . Family history of adverse reaction to anesthesia    "sister has PONV" (06/21/2017)  . Former tobacco use   . Gout   . Hepatitis C    "treated and cured" (06/21/2017)  . High cholesterol   . Hypertension   . Ischemic cardiomyopathy    a. EF 25-30% at intraop TEE 4/17  //  b. Limited Echo 5/17 - EF 45-50%, mild ant HK. c. EF 55-65% by cath 09/2017.  . Migraine    "3-4/yr" (06/21/2017)  . Myocardial infarction ([place]) 10/2015  . Sinus bradycardia    a. HR dropping into  40s in 02/2016 -> BB reduced.  . Stroke ([place]) 10/2016   "small one; sometimes my memory/cognitive issues" (06/21/2017)  . Symptomatic hypotension    a. 02/2016 ER visit -> meds reduced.    Patient Active Problem List   Diagnosis Date Noted  . Transient loss of consciousness 03/24/2018  . Ectopic atrial tachycardia ([place]) 02/09/2018  . Chest pain 03/10/2017  . Family hx-stroke 11/10/2016  . Stroke-like episode ([place]) - R brain, s/p tPA 11/09/2016  . Unstable angina ([place]) 09/07/2016  . Claudication of both lower extremities ([place])   . Pure hypercholesterolemia   . Tobacco abuse disorder   . CAD of autologous artery bypass graft without angina   . Chest pain at rest  06/10/2016  . Abnormal nuclear stress test - HIGH RISK 04/20/2016  . Old MI (myocardial infarction)   . Essential hypertension 02/26/2016  . Ischemic cardiomyopathy 12/25/2015  . Hyperlipidemia LDL goal <70 12/25/2015  . 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 Coronary artery disease involving native coronary artery of native heart with unstable angina pectoris ([place]) 11/28/2015  . 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 Acute MI anterior wall first episode care [facility])   . Chest pain with high risk for cardiac etiology 03/07/2015  . Dysphagia 03/07/2015  . Gout attack 03/07/2015  . Mixed bipolar I disorder ([place]) 03/07/2015  . Fibromyalgia 07/09/2014  . Gout 07/09/2014  . Anxiety 07/09/2014  . Depression 07/09/2014  . Hepatitis C 11/20/2012  . Eosinophilic esophagitis 54/65/0354    Past Surgical History:  Procedure Laterality Date  . BASAL CELL CARCINOMA EXCISION     "forehead  . CARDIAC CATHETERIZATION N/A 11/28/2015   Procedure: Left Heart Cath and Coronary Angiography;  Surgeon: [name], MD;  Location: [facility];  Service: Cardiovascular;  Laterality: N/A;  . CARDIAC CATHETERIZATION N/A 11/28/2015   Procedure: Coronary Balloon Angioplasty;  Surgeon: [name], MD;  Location: [place] [facility];  Service: Cardiovascular;  Laterality: N/A;  ostial LAD  . CARDIAC CATHETERIZATION N/A 11/28/2015   Procedure: Coronary/Graft Angiography;  Surgeon: [name], MD;  Location: [place] [facility];  Service: Cardiovascular;  Laterality: N/A;  coronaries only   . CARDIAC CATHETERIZATION N/A 04/21/2016   Procedure: Left Heart Cath and Coronary Angiography;  Surgeon: Wellington Hampshire, MD;  Location: [place] [facility];  Service: Cardiovascular;  Laterality: N/A;  . CARDIAC CATHETERIZATION N/A 06/14/2016   Procedure: Left Heart Cath and Cors/Grafts Angiography;  Surgeon: Lorretta Harp, MD;  Location: [place] [facility];  Service: Cardiovascular;  Laterality:  N/A;  . CARDIAC CATHETERIZATION N/A 09/08/2016   Procedure: Left Heart Cath and Cors/Grafts Angiography;  Surgeon: Wellington Hampshire, MD;  Location: [place] [facility];  Service: Cardiovascular;  Laterality: N/A;  . CARDIAC CATHETERIZATION    . CORONARY ARTERY BYPASS GRAFT N/A 11/28/2015   Procedure: CORONARY ARTERY BYPASS GRAFTING (CABG) TIMES FIVE USING LEFT INTERNAL MAMMARY ARTERY AND RIGHT GREATER SAPHENOUS,VIEN HARVEATED BY ENDOVIEN, INTRAOPPRATIVE TEE;  Surgeon: Gaye Pollack, MD;  Location: [facility];  Service: Open Heart Surgery;  Laterality: N/A;  . CORONARY STENT INTERVENTION N/A 10/05/2017   Procedure: CORONARY STENT INTERVENTION;  Surgeon: [name], MD;  Location: [facility];  Service: Cardiovascular;  Laterality: N/A;  . HUMERUS SURGERY Right 1969   "tumor inside bone; filled it w/bone chips"  . LEFT HEART CATH AND CORS/GRAFTS ANGIOGRAPHY N/A 03/11/2017   Procedure: Left Heart Cath and Cors/Grafts Angiography;  Surgeon: Leonie Man, MD;  Location: [facility];  Service: Cardiovascular;  Laterality: N/A;  . LEFT HEART CATH AND CORS/GRAFTS ANGIOGRAPHY N/A 10/05/2017   Procedure: LEFT HEART CATH AND CORS/GRAFTS ANGIOGRAPHY;  Surgeon: [name], MD;  Location: [place] [facility];  Service: Cardiovascular;  Laterality: N/A;  . PERIPHERAL VASCULAR CATHETERIZATION N/A 06/14/2016   Procedure: Lower Extremity Angiography;  Surgeon: Lorretta Harp, MD;  Location: [facility];  Service: Cardiovascular;  Laterality: N/A;        Home Medications    Prior to Admission medications   Medication Sig Start Date End Date Taking? Authorizing Provider  acetaminophen (TYLENOL) 500 MG tablet Take 1,000 mg by mouth every 6 (six) hours as needed for mild pain or headache.     [provider]  amLODipine (NORVASC) 10 MG tablet Take 1 tablet (10 mg total) by mouth daily. 06/22/17   Strader, Fransisco Hertz, PA-C  aspirin 81 MG tablet Take 81 mg by mouth  daily.    [provider]  atorvastatin (LIPITOR) 80 MG tablet Take 1 tablet (80 mg total) by mouth daily. 08/16/17   [name], MD  chlordiazePOXIDE (LIBRIUM) 10 MG capsule Take 10 mg by mouth 4 (four) times daily as needed for anxiety.     [provider]  clopidogrel (PLAVIX) 75 MG tablet TAKE 1 TABLET BY MOUTH EVERY DAY 05/26/18   [name] T, PA-C  fluticasone (FLONASE) 50 MCG/ACT nasal spray Place 1 spray into both nostrils as needed for allergies.  05/26/17   [provider]  lisinopril (PRINIVIL,ZESTRIL) 5 MG tablet Take 5 mg by mouth daily. May take 1 additional tablet once daily for SBP greater than 150 once hour after taking daily dose Patient taking differently: Take 5 mg by mouth daily. Take 5 mg by mouth daily. May take 1 additional tablet once daily for SBP greater than 150 once hour after taking daily dose 01/18/18   [name], MD  metoprolol succinate (TOPROL XL) 50 MG 24 hr tablet Take 1 tablet (50 mg total) by mouth as directed. 50 mg in the AM and 25 mg in the PM 03/08/18   [name] T, PA-C  Multiple Vitamins-Minerals (ONE-A-DAY MENS 50+ ADVANTAGE PO) Take 1 tablet by mouth daily.     [provider]  nitroGLYCERIN (NITROSTAT) 0.4 MG SL tablet Place 1 tablet (0.4 mg total) under the tongue every 5 (five) minutes as needed for chest pain. 3 DOSE MAX 06/22/17   Strader, [place] M, PA-C  pantoprazole (PROTONIX) 40 MG tablet Take 40 mg by mouth daily.    [provider]  ranitidine (ZANTAC) 150 MG tablet Take 150 mg by mouth as needed for heartburn.    [provider]  ranolazine (RANEXA) 1000 MG SR tablet Take 1 tablet (1,000 mg total) by mouth 2 (two) times daily. 02/24/18   [name] T, PA-C  terbinafine (LAMISIL) 1 % cream Apply topically 2 (two) times daily. 03/26/18   Shahmehdi, Valeria Batman, MD  traZODone (DESYREL) 50 MG tablet Take 50-150 mg by mouth at bedtime as needed for sleep.  12/08/16    [provider]    Family History Family History  Problem Relation Age of Onset  . Lung cancer Mother   . Heart Problems Father   . Heart attack Father 66  . Stroke Father   . Heart failure Father   . Heart attack Maternal Grandmother   . Stroke Maternal Grandmother   .  Heart attack Paternal Uncle   . Hypertension Brother   . Autoimmune disease Neg Hx     Social History Social History   Tobacco Use  . Smoking status: Former Smoker    Packs/day: 0.75    Years: 44.00    Pack years: 33.00    Types: Cigarettes    Last attempt to quit: 11/28/2015    Years since quitting: 2.5  . Smokeless tobacco: Never Used  Substance Use Topics  . Alcohol use: No    Frequency: Never    Comment: 06/21/2017 "I'll have a few drinks q couple months"  . Drug use: No    Comment: 06/21/2017 "nothing since the 1980s"     Allergies   Prednisone; Tetanus toxoids; Wellbutrin [bupropion]; Morphine and related; Scallops [shellfish allergy]; and Chantix [varenicline]   Review of Systems Review of Systems  All other systems reviewed and are negative.    Physical Exam Updated Vital Signs BP 111/76 (BP Location: Right Arm)   Temp 98.7 F (37.1 C) (Oral)   Resp 10   SpO2 98%   Physical Exam  Constitutional: He appears well-developed and well-nourished.  HENT:  Head: Normocephalic and atraumatic.  Right Ear: External ear normal.  Left Ear: External ear normal.  Nose: Nose normal.  Mouth/Throat: Uvula is midline, oropharynx is clear and moist and mucous membranes are normal. No tonsillar exudate.  Eyes: Pupils are equal, round, and reactive to light. Right eye exhibits no discharge. Left eye exhibits no discharge. No scleral icterus.  Neck: Trachea normal. Neck supple. No JVD present. No spinous process tenderness present. Carotid bruit is not present. No neck rigidity. Normal range of motion present.  Cardiovascular: Normal rate, regular rhythm and intact distal pulses.  No murmur  heard. Pulses:      Radial pulses are 2+ on the right side, and 2+ on the left side.       Dorsalis pedis pulses are 2+ on the right side, and 2+ on the left side.       Posterior tibial pulses are 2+ on the right side, and 2+ on the left side.  No lower extremity swelling or edema. Calves symmetric in size bilaterally.  Pulmonary/Chest: Effort normal and breath sounds normal. He exhibits no tenderness.  Prior median sternotomy scar  Abdominal: Soft. Bowel sounds are normal. He exhibits no distension. There is no tenderness. There is no rigidity, no rebound, no guarding, no CVA tenderness and negative Murphy's sign.  Musculoskeletal: He exhibits no edema.  Lymphadenopathy:    He has no cervical adenopathy.  Neurological: He is alert.  Skin: Skin is warm and dry. No rash noted. He is not diaphoretic.  Psychiatric: He has a normal mood and affect.  Nursing note and vitals reviewed.    ED Treatments / Results  Labs (all labs ordered are listed, but only abnormal results are displayed) Labs Reviewed  BASIC METABOLIC PANEL  CBC  BRAIN NATRIURETIC PEPTIDE  I-STAT TROPONIN, ED  I-STAT TROPONIN, ED    EKG EKG Interpretation  Date/Time:  Friday June 09 2018 10:05:05 EDT Ventricular Rate:  75 PR Interval:    QRS Duration: 97 QT Interval:  411 QTC Calculation: 460 R Axis:   72 Text Interpretation:  Sinus rhythm Baseline wander in lead(s) V3 Confirmed by Nat Christen [id number]) on 06/09/2018 10:39:22 AM   Radiology Dg Chest 2 View  Result Date: 06/09/2018 CLINICAL DATA:  Chest pain EXAM: CHEST - 2 VIEW COMPARISON:  05/15/2018 FINDINGS: Prior CABG. Heart and mediastinal  contours are within normal limits. No focal opacities or effusions. No acute bony abnormality. IMPRESSION: No active cardiopulmonary disease. Electronically Signed   By: Rolm Baptise M.D.   On: 06/09/2018 10:52    Procedures Procedures (including critical care time)  Medications Ordered in ED Medications    fentaNYL (SUBLIMAZE) injection 50 mcg (50 mcg Intravenous Given 06/09/18 1142)  fentaNYL (SUBLIMAZE) injection 50 mcg (50 mcg Intravenous Given 06/09/18 1302)     Initial Impression / Assessment and Plan / ED Course  I have reviewed the triage vital signs and the nursing notes.  Pertinent labs & imaging results that were available during my care of the patient were reviewed by me and considered in my medical decision making (see chart for details).     62 y.o. male with a history of CAD status post anterior MI in March 2017 treated with emergent CABG, systolic heart failure secondary to ischemic cardiomyopathy, prior right brain stroke treated with TPA in March 2018, hypertension, hyperlipidemia who is followed by Dr.Varanasi who presents emergency department today for chest pain and shortness of breath.  Patient reports exertional shortness of breath as well as substernal chest pain that radiates to his left scapula that he states feels similar to when he had a heart attack in the past.  Pain is been waxing and waning since approximately 7:30 AM this morning.  Is associate with nausea as well.  Chest pain is not exertional in nature.  His EKG and initial troponin are reassuring.  Basic labs reviewed and reassuring.  Pain is controlled here in the department.  Will consult cardiology for further recommendations.  Patient has received 324 mg aspirin.  11:55 AM Discussed with Trish of cardiology. They will consult on the patient and provide further recommendations.   3:00 PM Cardiology has seen the patient. Recommends discharge and outpatient follow-up for Holter monitor.  Patient second troponin is negative.  EKG without any changes.  Feel this is reasonable. Patient case seen and discussed with Dr. Lacinda Axon who is in agreement with plan. Return precautions discussed.  Final Clinical Impressions(s) / ED Diagnoses   Final diagnoses:  Central chest pain    ED Discharge Orders    None        Lorelle Gibbs 06/09/18 1528    Nat Christen, MD 06/10/18 [phone]

## 2018-06-09 NOTE — Telephone Encounter (Signed)
New Message   Pt c/o of Chest Pain: STAT if CP now or developed within 24 hours  1. Are you having CP right now? yes  2. Are you experiencing any other symptoms (ex. SOB, nausea, vomiting, sweating)? Sob   3. How long have you been experiencing CP? This morning   4. Is your CP continuous or coming and going? Coming and going  5. Have you taken Nitroglycerin? Yes took 2     Patient states he took the trash out this morning and on the way back in he felt short of breath. He said he took his BP and it was 189/111 then he experienced some chest pain and took 2 nitroglycerin. ?

## 2018-06-09 NOTE — ED Triage Notes (Addendum)
Pt started having central chest pain, crushing in nature, radiating to back. Pain ongoing for 2-3 months. Pt reports feeling nausea. Denies SOB, lightheadedness. BP 133/86, HR 80, 96% on room air. Resp 18. Pt took 2 nitro and 324mg  ASA prior to EMS arriving- no relief.

## 2018-06-09 NOTE — Discharge Instructions (Signed)
Cardiology has seen you today. They advised discharge and outpatient follow up. Your EKG, chest xray and lab work was reassuring. Cardiology would like you to be set up to have a monitor as an outpatient. Please call there office Monday morning, 06/12/18, to ensure close follow up and to have this scheduled. If you develop worsening or new concerning symptoms you can return to the emergency department for re-evaluation.

## 2018-06-09 NOTE — Telephone Encounter (Signed)
Pt states this morning BP jumped up to 189/111, HR 120.  Pt had chest pain that radiated into shoulder blades.  Has taken two Nitro with minimal relief.  Denies sweating.  Does feel lightheaded and states left foot is numb and pain in left leg.  Pt took vitals while on the phone and they were 123/74, 81.  Pt has been to the ER a few times over the last several months for CP.  Pt states he was recently diagnosed with pleurisy but took the antibiotics and it has resolved.  Spoke with Dr. Caryl Comes, DOD, who reviewed pt's chart and said to go to ER.  Spoke with pt and advised him of recommendations.  Pt will call ambulance to come take him to [facility].  Pt appreciative for call.  Will route to Dr. Irish Lack to make him aware.

## 2018-06-09 NOTE — Consult Note (Signed)
Cardiology Consult note:   Patient ID: [name] MRN: [id number]; DOB: Nov 21, 1955   Admission date: 06/09/2018  Primary Care Provider: [name], MD Primary Cardiologist: Larae Grooms, MD  Chief Complaint:  Chest pain   Patient Profile:   [name] is a 62 y.o. male with  a hx of CAD s/p emergent CABG 2017, last cath 10/05/17 with DES to proximal RCA, hx of CVA, HTN, and anxiety presented for palpitation and chest pain.   History of CAD with MI in 10/2015 treated with emergent CABG, last cath 10/05/17 with DES to proximal RCA. Hx of ICM with LVEF of 20-25% during CABG with improved on subsequent echos.   Hospitalized 01/2018 for chest pain and palpitations and found to have ectopic atrial tachycardia. Beta blocker was titrated. D-dimer was negative. Due to continued complaints of chest pain, CT angio performed and was negative for aortic syndrome. Chest pain was thought to be related to his thoracic spine disease with referred pain.  Last seen 02/2018 when admitted for pre-syncope. Did not felt cardiac in origin. Since then he has missed multiple office visit. He is schedule for event monitor set up next week.   History of Present Illness:   [name] presented for acute onset palpitation and chest pain.  He said had palpitation this morning while watching the dog at backyard.  Symptoms lasted for 30 minutes with associated shortness of breath and dizziness.  Later he developed with chest pain which he described as sharp pressure radiating to left shoulder at intensity of 3 out of 10.  Which resolved with sublingual nitroglycerin x1.  Then he had a recurrent pain of 7 out of 10 he took nitroglycerin with resolution of symptoms and EMS called.  His pain improved here with fentanyl 50 mg x 2 and oxycodone 5 mg x 1.  During my evaluation he was chest pain-free.  However asking for more oxycodone.  Troponin negative x2.  Electrolyte and serum creatinine within normal limits.  Chest  X-ray without acute cardiopulmonary disease. EKG showed sinus rhythm without acute ischemic changes- personally reviewed.   He states he has a intermittent episode of palpitation lasting for about half an hour once every week.  Sometime it did not felt as well.  He his fatigue.  Denies associated chest pain all the time but did occurs occasionally.  No regular exercise.  Denies syncope, orthopnea, PND, melena or blood in his stool or urine.  In the ER, 05/15/18 for right-sided chest pain.  Ruled out and discharge.  He continued to have intermittent chest pain and treated with antibiotic/azithromycin about 2 weeks ago for pleurisy and possible bronchitis. He still has cough intermittently.    Past Medical History:  Diagnosis Date  . Anxiety   . Basal cell carcinoma (BCC) of forehead   . CAD (coronary artery disease)    a. 10/2015 ant STEMI >> LHC with 3 v CAD; oLAD tx with POBA >> emergent CABG. b. Multiple evals since that time, early graft failure of SVG-RCA by cath 03/2016. c. 2/19 PCI/DES x1 to pRCA, normal EF.  Marland Kitchen Depression   . Dyspnea   . Ectopic atrial tachycardia ([place])   . Esophageal reflux    eosinophil esophagitis  . Family history of adverse reaction to anesthesia    "sister has PONV" (06/21/2017)  . Former tobacco use   . Gout   . Hepatitis C    "treated and cured" (06/21/2017)  . High cholesterol   . Hypertension   .  Ischemic cardiomyopathy    a. EF 25-30% at intraop TEE 4/17  //  b. Limited Echo 5/17 - EF 45-50%, mild ant HK. c. EF 55-65% by cath 09/2017.  . Migraine    "3-4/yr" (06/21/2017)  . Myocardial infarction ([place]) 10/2015  . Sinus bradycardia    a. HR dropping into 40s in 02/2016 -> BB reduced.  . Stroke ([place]) 10/2016   "small one; sometimes my memory/cognitive issues" (06/21/2017)  . Symptomatic hypotension    a. 02/2016 ER visit -> meds reduced.    Past Surgical History:  Procedure Laterality Date  . BASAL CELL CARCINOMA EXCISION     "forehead  . CARDIAC  CATHETERIZATION N/A 11/28/2015   Procedure: Left Heart Cath and Coronary Angiography;  Surgeon: [name], MD;  Location: [facility];  Service: Cardiovascular;  Laterality: N/A;  . CARDIAC CATHETERIZATION N/A 11/28/2015   Procedure: Coronary Balloon Angioplasty;  Surgeon: [name], MD;  Location: [facility];  Service: Cardiovascular;  Laterality: N/A;  ostial LAD  . CARDIAC CATHETERIZATION N/A 11/28/2015   Procedure: Coronary/Graft Angiography;  Surgeon: [name], MD;  Location: [facility];  Service: Cardiovascular;  Laterality: N/A;  coronaries only   . CARDIAC CATHETERIZATION N/A 04/21/2016   Procedure: Left Heart Cath and Coronary Angiography;  Surgeon: Wellington Hampshire, MD;  Location: [facility];  Service: Cardiovascular;  Laterality: N/A;  . CARDIAC CATHETERIZATION N/A 06/14/2016   Procedure: Left Heart Cath and Cors/Grafts Angiography;  Surgeon: Lorretta Harp, MD;  Location: [facility];  Service: Cardiovascular;  Laterality: N/A;  . CARDIAC CATHETERIZATION N/A 09/08/2016   Procedure: Left Heart Cath and Cors/Grafts Angiography;  Surgeon: Wellington Hampshire, MD;  Location: [facility];  Service: Cardiovascular;  Laterality: N/A;  . CARDIAC CATHETERIZATION    . CORONARY ARTERY BYPASS GRAFT N/A 11/28/2015   Procedure: CORONARY ARTERY BYPASS GRAFTING (CABG) TIMES FIVE USING LEFT INTERNAL MAMMARY ARTERY AND RIGHT GREATER SAPHENOUS,VIEN HARVEATED BY ENDOVIEN, INTRAOPPRATIVE TEE;  Surgeon: Gaye Pollack, MD;  Location: [facility];  Service: Open Heart Surgery;  Laterality: N/A;  . CORONARY STENT INTERVENTION N/A 10/05/2017   Procedure: CORONARY STENT INTERVENTION;  Surgeon: [name], MD;  Location: [facility];  Service: Cardiovascular;  Laterality: N/A;  . HUMERUS SURGERY Right 1969   "tumor inside bone; filled it w/bone chips"  . LEFT HEART CATH AND CORS/GRAFTS ANGIOGRAPHY N/A 03/11/2017   Procedure: Left  Heart Cath and Cors/Grafts Angiography;  Surgeon: Leonie Man, MD;  Location: [facility];  Service: Cardiovascular;  Laterality: N/A;  . LEFT HEART CATH AND CORS/GRAFTS ANGIOGRAPHY N/A 10/05/2017   Procedure: LEFT HEART CATH AND CORS/GRAFTS ANGIOGRAPHY;  Surgeon: [name], MD;  Location: [facility];  Service: Cardiovascular;  Laterality: N/A;  . PERIPHERAL VASCULAR CATHETERIZATION N/A 06/14/2016   Procedure: Lower Extremity Angiography;  Surgeon: Lorretta Harp, MD;  Location: [facility];  Service: Cardiovascular;  Laterality: N/A;     Medications Prior to Admission: Prior to Admission medications   Medication Sig Start Date End Date Taking? Authorizing Provider  acetaminophen (TYLENOL) 500 MG tablet Take 1,000 mg by mouth every 6 (six) hours as needed for mild pain or headache.    Yes [provider]  amLODipine (NORVASC) 10 MG tablet Take 1 tablet (10 mg total) by mouth daily. 06/22/17  Yes Strader, Fransisco Hertz, PA-C  aspirin 81 MG tablet Take 81 mg  by mouth daily.   Yes [provider]  atorvastatin (LIPITOR) 80 MG tablet Take 1 tablet (80 mg total) by mouth daily. 08/16/17  Yes [name], MD  chlordiazePOXIDE (LIBRIUM) 10 MG capsule Take 20 mg by mouth at bedtime as needed for anxiety.    Yes [provider]  clopidogrel (PLAVIX) 75 MG tablet TAKE 1 TABLET BY MOUTH EVERY DAY Patient taking differently: Take 75 mg by mouth daily.  05/26/18  Yes Weaver, Scott T, PA-C  fluticasone (FLONASE) 50 MCG/ACT nasal spray Place 1 spray into both nostrils as needed for allergies.  05/26/17  Yes [provider]  lisinopril (PRINIVIL,ZESTRIL) 5 MG tablet Take 5 mg by mouth daily. May take 1 additional tablet once daily for SBP greater than 150 once hour after taking daily dose Patient taking differently: Take 5 mg by mouth daily. Take 5 mg by mouth daily. May take 1 additional tablet once daily for SBP greater than 150 once  hour after taking daily dose 01/18/18  Yes [name], MD  metoprolol succinate (TOPROL-XL) 25 MG 24 hr tablet Take 25 mg by mouth daily.   Yes [provider]  Multiple Vitamins-Minerals (ONE-A-DAY MENS 50+ ADVANTAGE PO) Take 1 tablet by mouth daily.    Yes [provider]  nitroGLYCERIN (NITROSTAT) 0.4 MG SL tablet Place 1 tablet (0.4 mg total) under the tongue every 5 (five) minutes as needed for chest pain. 3 DOSE MAX 06/22/17  Yes Strader, [place] M, PA-C  pantoprazole (PROTONIX) 40 MG tablet Take 40 mg by mouth daily.   Yes [provider]  ranitidine (ZANTAC) 150 MG tablet Take 150 mg by mouth as needed for heartburn.   Yes [provider]  ranolazine (RANEXA) 1000 MG SR tablet Take 1 tablet (1,000 mg total) by mouth 2 (two) times daily. 02/24/18  Yes Weaver, Scott T, PA-C  traZODone (DESYREL) 50 MG tablet Take 50-150 mg by mouth at bedtime as needed for sleep.  12/08/16  Yes [provider]  metoprolol succinate (TOPROL XL) 50 MG 24 hr tablet Take 1 tablet (50 mg total) by mouth as directed. 50 mg in the AM and 25 mg in the PM Patient not taking: Reported on 06/09/2018 03/08/18   [name] T, PA-C  terbinafine (LAMISIL) 1 % cream Apply topically 2 (two) times daily. Patient not taking: Reported on 06/09/2018 03/26/18   Deatra James, MD     Allergies:    Allergies  Allergen Reactions  . Prednisone Other (See Comments)    States that this med makes him "crazy"  . Tetanus Toxoids Swelling and Other (See Comments)    Fever, Swelling of the arm   . Wellbutrin [Bupropion] Other (See Comments)    Crazy thoughts, nightmares  . Morphine And Related Hives and Itching    Redness at the injection site  . Scallops [Shellfish Allergy] Nausea Only  . Chantix [Varenicline] Other (See Comments)    Dreams    Social History:   Social History   Socioeconomic History  . Marital status: Married    Spouse name: [name]  . Number of  children: 3  . Years of education: College  . Highest education level: Not on file  Occupational History  . Occupation: Scientist, research (physical sciences): SELF-EMPLOYED  Social Needs  . Financial resource strain: Not on file  . Food insecurity:    Worry: Not on file    Inability: Not on file  . Transportation needs:  Medical: Not on file    Non-medical: Not on file  Tobacco Use  . Smoking status: Former Smoker    Packs/day: 0.75    Years: 44.00    Pack years: 33.00    Types: Cigarettes    Last attempt to quit: 11/28/2015    Years since quitting: 2.5  . Smokeless tobacco: Never Used  Substance and Sexual Activity  . Alcohol use: No    Frequency: Never    Comment: 06/21/2017 "I'll have a few drinks q couple months"  . Drug use: No    Comment: 06/21/2017 "nothing since the 1980s"  . Sexual activity: Yes  Lifestyle  . Physical activity:    Days per week: Not on file    Minutes per session: Not on file  . Stress: Not on file  Relationships  . Social connections:    Talks on phone: Not on file    Gets together: Not on file    Attends religious service: Not on file    Active member of club or organization: Not on file    Attends meetings of clubs or organizations: Not on file    Relationship status: Not on file  . Intimate partner violence:    Fear of current or ex partner: Not on file    Emotionally abused: Not on file    Physically abused: Not on file    Forced sexual activity: Not on file  Other Topics Concern  . Not on file  Social History Narrative   Patient lives at home with his spouse.   Caffeine Use: yes    Family History:   The patient's family history includes Heart Problems in his father; Heart attack in his maternal grandmother and paternal uncle; Heart attack (age of onset: 22) in his father; Heart failure in his father; Hypertension in his brother; Lung cancer in his mother; Stroke in his father and maternal grandmother. There is no history of Autoimmune  disease.    ROS:  Please see the history of present illness.  All other ROS reviewed and negative.     Physical Exam/Data:   Vitals:   06/09/18 0959 06/09/18 1205 06/09/18 1303 06/09/18 1411  BP: 111/76 126/76 116/73 (!) 141/86  Pulse:  79 71 72  Resp: 10 14 13 12   Temp: 98.7 F (37.1 C)     TempSrc: Oral     SpO2: 98% 96% 99% 99%   No intake or output data in the 24 hours ending 06/09/18 1443 There were no vitals filed for this visit. There is no height or weight on file to calculate BMI.  General:  Well nourished, well developed, in no acute distress HEENT: normal Lymph: no adenopathy Neck: no JVD Endocrine:  No thryomegaly Vascular: No carotid bruits; FA pulses 2+ bilaterally without bruits  Cardiac:  normal S1, S2; RRR; no murmur  Lungs:  clear to auscultation bilaterally, no wheezing, rhonchi or rales  Abd: soft, nontender, no hepatomegaly  Ext: no  edema Musculoskeletal:  No deformities, BUE and BLE strength normal and equal Skin: warm and dry  Neuro:  CNs 2-12 intact, no focal abnormalities noted Psych:  Normal affect   Relevant CV Studies:  Echo 03/25/18 Study Conclusions  - Left ventricle: The cavity size was normal. Systolic function was   normal. The estimated ejection fraction was in the range of 60%   to 65%. Wall motion was normal; there were no regional wall   motion abnormalities. Left ventricular diastolic function   parameters  were normal. - Aortic valve: Trileaflet; mildly thickened, mildly calcified   leaflets. - Mitral valve: There was trivial regurgitation. - Atrial septum: There was increased thickness of the septum,   consistent with lipomatous hypertrophy. - Tricuspid valve: There was trivial regurgitation.  CORONARY STENT INTERVENTION  10/05/17  LEFT HEART CATH AND CORS/GRAFTS ANGIOGRAPHY  Conclusion     Ost LAD to Prox LAD lesion is 50% stenosed.  Mid LAD lesion is 55% stenosed. LIMA to LAD is patent.  SVG to diagonal is  occluded. There is no significant obstruction in the native diagonal.  Ost Cx lesion is 80% stenosed.  Prox Cx to Mid Cx lesion is 50% stenosed. SVG to OM is patent.  Dist RCA lesion is 50% stenosed.  RPDA lesion is 40% stenosed.  Ost RCA to Prox RCA lesion is 75% stenosed. Origin to Prox Graft lesion is 70% stenosed.  A drug-eluting stent was successfully placed using a STENT SYNERGY DES 4X16, postdilated to 4.5 mm.  Post intervention, there is a 0% residual stenosis.  The left ventricular systolic function is normal.  LV end diastolic pressure is normal.  The left ventricular ejection fraction is 55-65% by visual estimate.  There is no aortic valve stenosis.   Continue dual antiplatelet therapy for at least one year and, clopidogrel likely indefinitely.  Aggressive secondary prevention.     Diagnostic Diagram       Post-Intervention Diagram         Laboratory Data:  Chemistry Recent Labs  Lab 06/09/18 1010  NA 137  K 4.7  CL 106  CO2 24  GLUCOSE 99  BUN 20  CREATININE 1.08  CALCIUM 9.6  GFRNONAA >60  GFRAA >60  ANIONGAP 7    Hematology Recent Labs  Lab 06/09/18 1010  WBC 8.0  RBC 4.68  HGB 13.6  HCT 43.8  MCV 93.6  MCH 29.1  MCHC 31.1  RDW 12.5  PLT 221   Recent Labs  Lab 06/09/18 1014 06/09/18 1341  TROPIPOC 0.00 0.00    Radiology/Studies:  Dg Chest 2 View  Result Date: 06/09/2018 CLINICAL DATA:  Chest pain EXAM: CHEST - 2 VIEW COMPARISON:  05/15/2018 FINDINGS: Prior CABG. Heart and mediastinal contours are within normal limits. No focal opacities or effusions. No acute bony abnormality. IMPRESSION: No active cardiopulmonary disease. Electronically Signed   By: Rolm Baptise M.D.   On: 06/09/2018 10:52   Assessment and Plan:   1. Chest pain with with hx of CAD s/p CABG in 2017 and DES to RCA 09/2017 - He has chronic intermittent chest pain which felt thoracic spine disease. Episode today resolved with SL nitro. Stays similar to  prior angina but less intense. However some narcotic seeking behaviors which address by Dr. Irish Lack on prior notes. Troponin negative x 2. EKG without acute ischemic changes.   2. Palpitations - Prior hx of ectopic atrial tachycardia on 01/2018. He is schedule for event monitor setup next week. No arrhthymias on telemetry.    For questions or updates, please contact [facility] Please consult [email or web address] for contact info under        Jarrett Soho, PA  06/09/2018 2:43 PM

## 2018-06-13 ENCOUNTER — Ambulatory Visit (INDEPENDENT_AMBULATORY_CARE_PROVIDER_SITE_OTHER): Payer: BC Managed Care – PPO

## 2018-06-13 DIAGNOSIS — R55 Syncope and collapse: Secondary | ICD-10-CM | POA: Diagnosis not present

## 2018-06-23 ENCOUNTER — Encounter (HOSPITAL_COMMUNITY): Payer: Self-pay | Admitting: *Deleted

## 2018-06-23 ENCOUNTER — Emergency Department (HOSPITAL_COMMUNITY): Payer: BC Managed Care – PPO

## 2018-06-23 ENCOUNTER — Emergency Department (HOSPITAL_COMMUNITY)
Admission: EM | Admit: 2018-06-23 | Discharge: 2018-06-23 | Disposition: A | Discharge status: Home / Self Care | Payer: BC Managed Care – PPO | Attending: Emergency Medicine | Admitting: Emergency Medicine

## 2018-06-23 ENCOUNTER — Telehealth: Payer: Self-pay | Admitting: Interventional Cardiology

## 2018-06-23 DIAGNOSIS — Z951 Presence of aortocoronary bypass graft: Secondary | ICD-10-CM | POA: Insufficient documentation

## 2018-06-23 DIAGNOSIS — Z79899 Other long term (current) drug therapy: Secondary | ICD-10-CM | POA: Diagnosis not present

## 2018-06-23 DIAGNOSIS — I2510 Atherosclerotic heart disease of native coronary artery without angina pectoris: Secondary | ICD-10-CM | POA: Diagnosis not present

## 2018-06-23 DIAGNOSIS — Z7982 Long term (current) use of aspirin: Secondary | ICD-10-CM | POA: Insufficient documentation

## 2018-06-23 DIAGNOSIS — Z8673 Personal history of transient ischemic attack (TIA), and cerebral infarction without residual deficits: Secondary | ICD-10-CM | POA: Insufficient documentation

## 2018-06-23 DIAGNOSIS — Z7902 Long term (current) use of antithrombotics/antiplatelets: Secondary | ICD-10-CM | POA: Insufficient documentation

## 2018-06-23 DIAGNOSIS — G43009 Migraine without aura, not intractable, without status migrainosus: Secondary | ICD-10-CM

## 2018-06-23 DIAGNOSIS — I252 Old myocardial infarction: Secondary | ICD-10-CM | POA: Insufficient documentation

## 2018-06-23 DIAGNOSIS — I10 Essential (primary) hypertension: Secondary | ICD-10-CM | POA: Diagnosis not present

## 2018-06-23 DIAGNOSIS — Z85828 Personal history of other malignant neoplasm of skin: Secondary | ICD-10-CM | POA: Insufficient documentation

## 2018-06-23 DIAGNOSIS — R51 Headache: Secondary | ICD-10-CM | POA: Diagnosis present

## 2018-06-23 DIAGNOSIS — Z87891 Personal history of nicotine dependence: Secondary | ICD-10-CM | POA: Insufficient documentation

## 2018-06-23 LAB — DIFFERENTIAL
Abs Immature Granulocytes: 0.03 10*3/uL (ref 0.00–0.07)
Basophils Absolute: 0.1 10*3/uL (ref 0.0–0.1)
Basophils Relative: 1 %
Eosinophils Absolute: 0.2 10*3/uL (ref 0.0–0.5)
Eosinophils Relative: 2 %
Immature Granulocytes: 0 %
Lymphocytes Relative: 26 %
Lymphs Abs: 2.4 10*3/uL (ref 0.7–4.0)
Monocytes Absolute: 0.6 10*3/uL (ref 0.1–1.0)
Monocytes Relative: 6 %
Neutro Abs: 6.1 10*3/uL (ref 1.7–7.7)
Neutrophils Relative %: 65 %

## 2018-06-23 LAB — I-STAT CHEM 8, ED
BUN: 25 mg/dL — ABNORMAL HIGH (ref 8–23)
Calcium, Ion: 1.23 mmol/L (ref 1.15–1.40)
Chloride: 107 mmol/L (ref 98–111)
Creatinine, Ser: 1.3 mg/dL — ABNORMAL HIGH (ref 0.61–1.24)
Glucose, Bld: 91 mg/dL (ref 70–99)
HCT: 43 % (ref 39.0–52.0)
Hemoglobin: 14.6 g/dL (ref 13.0–17.0)
Potassium: 4.7 mmol/L (ref 3.5–5.1)
Sodium: 143 mmol/L (ref 135–145)
TCO2: 29 mmol/L (ref 22–32)

## 2018-06-23 LAB — CBC
HCT: 45.4 % (ref 39.0–52.0)
Hemoglobin: 14.2 g/dL (ref 13.0–17.0)
MCH: 29.5 pg (ref 26.0–34.0)
MCHC: 31.3 g/dL (ref 30.0–36.0)
MCV: 94.4 fL (ref 80.0–100.0)
Platelets: 225 10*3/uL (ref 150–400)
RBC: 4.81 MIL/uL (ref 4.22–5.81)
RDW: 12.5 % (ref 11.5–15.5)
WBC: 9.3 10*3/uL (ref 4.0–10.5)
nRBC: 0 % (ref 0.0–0.2)

## 2018-06-23 LAB — COMPREHENSIVE METABOLIC PANEL
ALT: 25 U/L (ref 0–44)
AST: 20 U/L (ref 15–41)
Albumin: 4.1 g/dL (ref 3.5–5.0)
Alkaline Phosphatase: 58 U/L (ref 38–126)
Anion gap: 7 (ref 5–15)
BUN: 20 mg/dL (ref 8–23)
CO2: 26 mmol/L (ref 22–32)
Calcium: 9.6 mg/dL (ref 8.9–10.3)
Chloride: 109 mmol/L (ref 98–111)
Creatinine, Ser: 1.25 mg/dL — ABNORMAL HIGH (ref 0.61–1.24)
GFR calc Af Amer: >60 mL/min (ref >60)
GFR calc non Af Amer: 60 mL/min — ABNORMAL LOW (ref >60)
Glucose, Bld: 94 mg/dL (ref 70–99)
Potassium: 4.7 mmol/L (ref 3.5–5.1)
Sodium: 142 mmol/L (ref 135–145)
Total Bilirubin: 0.7 mg/dL (ref 0.3–1.2)
Total Protein: 6.8 g/dL (ref 6.5–8.1)

## 2018-06-23 LAB — PROTIME-INR
INR: 0.97
Prothrombin Time: 12.8 seconds (ref 11.4–15.2)

## 2018-06-23 LAB — I-STAT TROPONIN, ED: Troponin i, poc: 0 ng/mL (ref 0.00–0.08)

## 2018-06-23 LAB — APTT: aPTT: 47 seconds — ABNORMAL HIGH (ref 24–36)

## 2018-06-23 MED ORDER — PROCHLORPERAZINE EDISYLATE 10 MG/2ML IJ SOLN
10.0000 mg | Freq: Once | INTRAMUSCULAR | Status: AC
  Administered 2018-06-23: 10 mg via INTRAVENOUS
  Filled 2018-06-23: qty 2

## 2018-06-23 MED ORDER — SODIUM CHLORIDE 0.9 % IV BOLUS
1000.0000 mL | Freq: Once | INTRAVENOUS | Status: AC
  Administered 2018-06-23: 1000 mL via INTRAVENOUS

## 2018-06-23 MED ORDER — DIPHENHYDRAMINE HCL 50 MG/ML IJ SOLN
50.0000 mg | Freq: Once | INTRAMUSCULAR | Status: AC
  Administered 2018-06-23: 50 mg via INTRAVENOUS
  Filled 2018-06-23: qty 1

## 2018-06-23 NOTE — ED Triage Notes (Signed)
Pt in via EMS c/o headache since Wednesday, also thinks he is weaker to his left side, equal grip strength and movement, no deficits noted, also reports peripheral vision changes, ambulatory without difficulty

## 2018-06-23 NOTE — Telephone Encounter (Signed)
Pt called to report that he has been having a severe headache over the past 3 days.Marland Kitchen He woke up today with dizziness, visual disturbances, and extreme fatigue.Marland Kitchen He still has a bad headache and Tylenol is not helping. He says that he is having left sided weakness... He reports that he had a TIA last year... His BP is 110/70 this morning... But with his concerning symptoms... I urged him to call EMS and the pt verbalized understanding and agreed.

## 2018-06-23 NOTE — Discharge Instructions (Signed)
Please talk with your doctor about getting a carotid doppler performed for the vessels in your neck to see if you are at higher risk for getting more TIAs or strokes in the future.

## 2018-06-23 NOTE — ED Notes (Signed)
Patient is requesting to be placed in a room.  Has asked the EDP, registration, and now this RN.  Spoke to [employer], will accommodate with next reasonable opening.  Pt made aware.

## 2018-06-23 NOTE — Telephone Encounter (Signed)
New message:       STAT if patient feels like he/she is going to faint   1) Are you dizzy now? Yes  2) Do you feel faint or have you passed out? Feel faint  3) Do you have any other symptoms? Disoriented and has a really bad headache and some numbness  4) Have you checked your HR and BP (record if available)? BP 110/70/HR 120

## 2018-06-23 NOTE — ED Provider Notes (Signed)
[facility] EMERGENCY DEPARTMENT Provider Note   CSN: [id number] Arrival date & time: 06/23/18  1017     History   Chief Complaint Chief Complaint  Patient presents with  . Headache    HPI [name] is a 62 y.o. male with a PMH of CAD and strokelike symptoms who presents with headache and fatigue.  His headache started around 3 AM on Wednesday, October 23.  His headache was associated with some nausea and some photophobia.  He has been able to eat and drink normally.  He describes some weakness and numbness in his left thigh and left arm, but this has since subsided.  He thinks he may have had some confusion as well, but this is also since resolved.  He called his cardiologist earlier this morning in detailed his symptoms with the triage nurse, who strongly advised him to go to the emergency department for further evaluation.   Past Medical History:  Diagnosis Date  . Anxiety   . Basal cell carcinoma (BCC) of forehead   . CAD (coronary artery disease)    a. 10/2015 ant STEMI >> LHC with 3 v CAD; oLAD tx with POBA >> emergent CABG. b. Multiple evals since that time, early graft failure of SVG-RCA by cath 03/2016. c. 2/19 PCI/DES x1 to pRCA, normal EF.  Marland Kitchen Depression   . Dyspnea   . Ectopic atrial tachycardia ([place])   . Esophageal reflux    eosinophil esophagitis  . Family history of adverse reaction to anesthesia    "sister has PONV" (06/21/2017)  . Former tobacco use   . Gout   . Hepatitis C    "treated and cured" (06/21/2017)  . High cholesterol   . Hypertension   . Ischemic cardiomyopathy    a. EF 25-30% at intraop TEE 4/17  //  b. Limited Echo 5/17 - EF 45-50%, mild ant HK. c. EF 55-65% by cath 09/2017.  . Migraine    "3-4/yr" (06/21/2017)  . Myocardial infarction ([place]) 10/2015  . Sinus bradycardia    a. HR dropping into 40s in 02/2016 -> BB reduced.  . Stroke ([place]) 10/2016   "small one; sometimes my memory/cognitive issues" (06/21/2017)  .  Symptomatic hypotension    a. 02/2016 ER visit -> meds reduced.    Patient Active Problem List   Diagnosis Date Noted  . Palpitations   . Coronary artery disease of bypass graft of native heart with stable angina pectoris ([place])   . Transient loss of consciousness 03/24/2018  . Ectopic atrial tachycardia ([place]) 02/09/2018  . Central chest pain 03/10/2017  . Family hx-stroke 11/10/2016  . Stroke-like episode ([place]) - R brain, s/p tPA 11/09/2016  . Unstable angina ([place]) 09/07/2016  . Claudication of both lower extremities ([place])   . Pure hypercholesterolemia   . Tobacco abuse disorder   . CAD of autologous artery bypass graft without angina   . Chest pain at rest 06/10/2016  . Abnormal nuclear stress test - HIGH RISK 04/20/2016  . Old MI (myocardial infarction)   . Essential hypertension 02/26/2016  . Ischemic cardiomyopathy 12/25/2015  . Hyperlipidemia LDL goal <70 12/25/2015  . 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 Coronary artery disease involving native coronary artery of native heart with unstable angina pectoris ([place]) 11/28/2015  . 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 Acute MI anterior wall first episode care [facility])   . Chest pain with high risk for cardiac etiology 03/07/2015  . Dysphagia 03/07/2015  .  Gout attack 03/07/2015  . Mixed bipolar I disorder ([place]) 03/07/2015  . Fibromyalgia 07/09/2014  . Gout 07/09/2014  . Anxiety 07/09/2014  . Depression 07/09/2014  . Hepatitis C 11/20/2012  . Eosinophilic esophagitis 93/81/8299    Past Surgical History:  Procedure Laterality Date  . BASAL CELL CARCINOMA EXCISION     "forehead  . CARDIAC CATHETERIZATION N/A 11/28/2015   Procedure: Left Heart Cath and Coronary Angiography;  Surgeon: [name], MD;  Location: [facility];  Service: Cardiovascular;  Laterality: N/A;  . CARDIAC CATHETERIZATION N/A 11/28/2015   Procedure: Coronary Balloon Angioplasty;  Surgeon: [name], MD;  Location: [facility]  [facility];  Service: Cardiovascular;  Laterality: N/A;  ostial LAD  . CARDIAC CATHETERIZATION N/A 11/28/2015   Procedure: Coronary/Graft Angiography;  Surgeon: [name], MD;  Location: [facility];  Service: Cardiovascular;  Laterality: N/A;  coronaries only   . CARDIAC CATHETERIZATION N/A 04/21/2016   Procedure: Left Heart Cath and Coronary Angiography;  Surgeon: Wellington Hampshire, MD;  Location: [facility];  Service: Cardiovascular;  Laterality: N/A;  . CARDIAC CATHETERIZATION N/A 06/14/2016   Procedure: Left Heart Cath and Cors/Grafts Angiography;  Surgeon: Lorretta Harp, MD;  Location: [facility];  Service: Cardiovascular;  Laterality: N/A;  . CARDIAC CATHETERIZATION N/A 09/08/2016   Procedure: Left Heart Cath and Cors/Grafts Angiography;  Surgeon: Wellington Hampshire, MD;  Location: [facility];  Service: Cardiovascular;  Laterality: N/A;  . CARDIAC CATHETERIZATION    . CORONARY ARTERY BYPASS GRAFT N/A 11/28/2015   Procedure: CORONARY ARTERY BYPASS GRAFTING (CABG) TIMES FIVE USING LEFT INTERNAL MAMMARY ARTERY AND RIGHT GREATER SAPHENOUS,VIEN HARVEATED BY ENDOVIEN, INTRAOPPRATIVE TEE;  Surgeon: Gaye Pollack, MD;  Location: [facility];  Service: Open Heart Surgery;  Laterality: N/A;  . CORONARY STENT INTERVENTION N/A 10/05/2017   Procedure: CORONARY STENT INTERVENTION;  Surgeon: [name], MD;  Location: [facility];  Service: Cardiovascular;  Laterality: N/A;  . HUMERUS SURGERY Right 1969   "tumor inside bone; filled it w/bone chips"  . LEFT HEART CATH AND CORS/GRAFTS ANGIOGRAPHY N/A 03/11/2017   Procedure: Left Heart Cath and Cors/Grafts Angiography;  Surgeon: Leonie Man, MD;  Location: [facility];  Service: Cardiovascular;  Laterality: N/A;  . LEFT HEART CATH AND CORS/GRAFTS ANGIOGRAPHY N/A 10/05/2017   Procedure: LEFT HEART CATH AND CORS/GRAFTS ANGIOGRAPHY;  Surgeon: [name], MD;  Location: [facility];  Service:  Cardiovascular;  Laterality: N/A;  . PERIPHERAL VASCULAR CATHETERIZATION N/A 06/14/2016   Procedure: Lower Extremity Angiography;  Surgeon: Lorretta Harp, MD;  Location: [facility];  Service: Cardiovascular;  Laterality: N/A;        Home Medications    Prior to Admission medications   Medication Sig Start Date End Date Taking? Authorizing Provider  acetaminophen (TYLENOL) 500 MG tablet Take 1,000 mg by mouth every 6 (six) hours as needed for mild pain or headache.    Yes [provider]  aspirin 81 MG tablet Take 81 mg by mouth daily.   Yes [provider]  atorvastatin (LIPITOR) 80 MG tablet Take 1 tablet (80 mg total) by mouth daily. 08/16/17  Yes [name], MD  chlordiazePOXIDE (LIBRIUM) 10 MG capsule Take 10-20 mg by mouth at bedtime as needed for anxiety.    Yes [provider]  clopidogrel (PLAVIX) 75 MG tablet TAKE 1 TABLET BY MOUTH EVERY DAY Patient taking differently:  Take 75 mg by mouth daily.  05/26/18  Yes Weaver, Scott T, PA-C  fluticasone (FLONASE) 50 MCG/ACT nasal spray Place 1 spray into both nostrils as needed for allergies.  05/26/17  Yes [provider]  metoprolol succinate (TOPROL-XL) 25 MG 24 hr tablet Take 25 mg by mouth daily.   Yes [provider]  Multiple Vitamins-Minerals (ONE-A-DAY MENS 50+ ADVANTAGE PO) Take 1 tablet by mouth daily.    Yes [provider]  pantoprazole (PROTONIX) 40 MG tablet Take 40 mg by mouth daily.   Yes [provider]  ranitidine (ZANTAC) 150 MG tablet Take 150 mg by mouth as needed for heartburn.   Yes [provider]  ranolazine (RANEXA) 1000 MG SR tablet Take 1 tablet (1,000 mg total) by mouth 2 (two) times daily. 02/24/18  Yes Weaver, Scott T, PA-C  VIIBRYD 10 MG TABS Take 10 mg by mouth daily. 06/10/18  Yes [provider]  zolpidem (AMBIEN CR) 12.5 MG CR tablet Take 12.5 mg by mouth at bedtime. 06/10/18  Yes [provider]    amLODipine (NORVASC) 10 MG tablet Take 1 tablet (10 mg total) by mouth daily. 06/22/17   Strader, Fransisco Hertz, PA-C  lisinopril (PRINIVIL,ZESTRIL) 5 MG tablet Take 5 mg by mouth daily. May take 1 additional tablet once daily for SBP greater than 150 once hour after taking daily dose Patient taking differently: Take 5 mg by mouth See admin instructions. Take 5 mg by mouth daily. May take 1 additional tablet once daily for SBP greater than 150 once hour after taking daily dose 01/18/18   [name], MD  metoprolol succinate (TOPROL XL) 50 MG 24 hr tablet Take 1 tablet (50 mg total) by mouth as directed. 50 mg in the AM and 25 mg in the PM Patient not taking: Reported on 06/09/2018 03/08/18   [name] T, PA-C  nitroGLYCERIN (NITROSTAT) 0.4 MG SL tablet Place 1 tablet (0.4 mg total) under the tongue every 5 (five) minutes as needed for chest pain. 3 DOSE MAX 06/22/17   Erma Heritage, PA-C    Family History Family History  Problem Relation Age of Onset  . Lung cancer Mother   . Heart Problems Father   . Heart attack Father 63  . Stroke Father   . Heart failure Father   . Heart attack Maternal Grandmother   . Stroke Maternal Grandmother   . Heart attack Paternal Uncle   . Hypertension Brother   . Autoimmune disease Neg Hx     Social History Social History   Tobacco Use  . Smoking status: Former Smoker    Packs/day: 0.75    Years: 44.00    Pack years: 33.00    Types: Cigarettes    Last attempt to quit: 11/28/2015    Years since quitting: 2.5  . Smokeless tobacco: Never Used  Substance Use Topics  . Alcohol use: No    Frequency: Never    Comment: 06/21/2017 "I'll have a few drinks q couple months"  . Drug use: No    Comment: 06/21/2017 "nothing since the 1980s"     Allergies   Prednisone; Tetanus toxoids; Wellbutrin [bupropion]; Morphine and related; Scallops [shellfish allergy]; and Chantix [varenicline]   Review of Systems Review of Systems   Physical  Exam Updated Vital Signs BP 123/76 (BP Location: Left Arm)   Pulse 66   Temp 98.4 F (36.9 C) (Oral)   Resp 18   Ht 5\' 9"  (1.753 m)  Wt 88.5 kg   SpO2 100%   BMI 28.80 kg/m   Physical Exam   ED Treatments / Results  Labs (all labs ordered are listed, but only abnormal results are displayed) Labs Reviewed  APTT - Abnormal; Notable for the following components:      Result Value   aPTT 47 (*)    All other components within normal limits  COMPREHENSIVE METABOLIC PANEL - Abnormal; Notable for the following components:   Creatinine, Ser 1.25 (*)    GFR calc non Af Amer 60 (*)    All other components within normal limits  I-STAT CHEM 8, ED - Abnormal; Notable for the following components:   BUN 25 (*)    Creatinine, Ser 1.30 (*)    All other components within normal limits  PROTIME-INR  CBC  DIFFERENTIAL  I-STAT TROPONIN, ED    EKG None  Radiology Ct Head Wo Contrast  Result Date: 06/23/2018 CLINICAL DATA:  Headache for 2 days.  Facial numbness. EXAM: CT HEAD WITHOUT CONTRAST TECHNIQUE: Contiguous axial images were obtained from the base of the skull through the vertex without intravenous contrast. COMPARISON:  None. FINDINGS: Brain: No evidence of acute infarction, hemorrhage, hydrocephalus, extra-axial collection or mass lesion/mass effect. Vascular: No hyperdense vessel or unexpected calcification. Skull: Normal. Negative for fracture or focal lesion. Sinuses/Orbits: No acute finding. Other: None IMPRESSION: 1. Normal brain for age. Electronically Signed   By: Kerby Moors M.D.   On: 06/23/2018 11:09    Procedures Procedures (including critical care time)  Medications Ordered in ED Medications  sodium chloride 0.9 % bolus 1,000 mL (1,000 mLs Intravenous New Bag/Given 06/23/18 1209)  prochlorperazine (COMPAZINE) injection 10 mg (10 mg Intravenous Given 06/23/18 1244)  diphenhydrAMINE (BENADRYL) injection 50 mg (50 mg Intravenous Given 06/23/18 1242)      Initial Impression / Assessment and Plan / ED Course  I have reviewed the triage vital signs and the nursing notes.  Pertinent labs & imaging results that were available during my care of the patient were reviewed by me and considered in my medical decision making (see chart for details).     Patient presents with headache, vague neurologic symptoms, and AKI.  His history of left-sided weakness both remotely and recently, this could also be a TIA.  Will give a 1 L fluid bolus, Compazine, and Benadryl for possible migraine.  Since neurologic exam and head CT are normal and patient reports an improvement in his neurologic symptoms, an MRI is not indicated at this time.  Headache is improved after Compazine and Benadryl administration.  We recommended that patient follow-up with his primary doctor about getting carotid dopplers performed, and patient was agreeable to this plan.  He was determined to be safe for discharge.  Final Clinical Impressions(s) / ED Diagnoses   Final diagnoses:  Migraine without aura and without status migrainosus, not intractable    ED Discharge Orders    None       Kathrene Alu, MD 06/23/18 1549    Elnora Morrison, MD 06/23/18 [phone]

## 2018-07-07 IMAGING — CR DG CHEST 1V PORT
1 series · 1 of 1 positions shown · non-contrast
Comparison: Radiographs June 10, 2016.

CLINICAL DATA: Chest pain.

EXAM:
PORTABLE CHEST 1 VIEW

[AP]
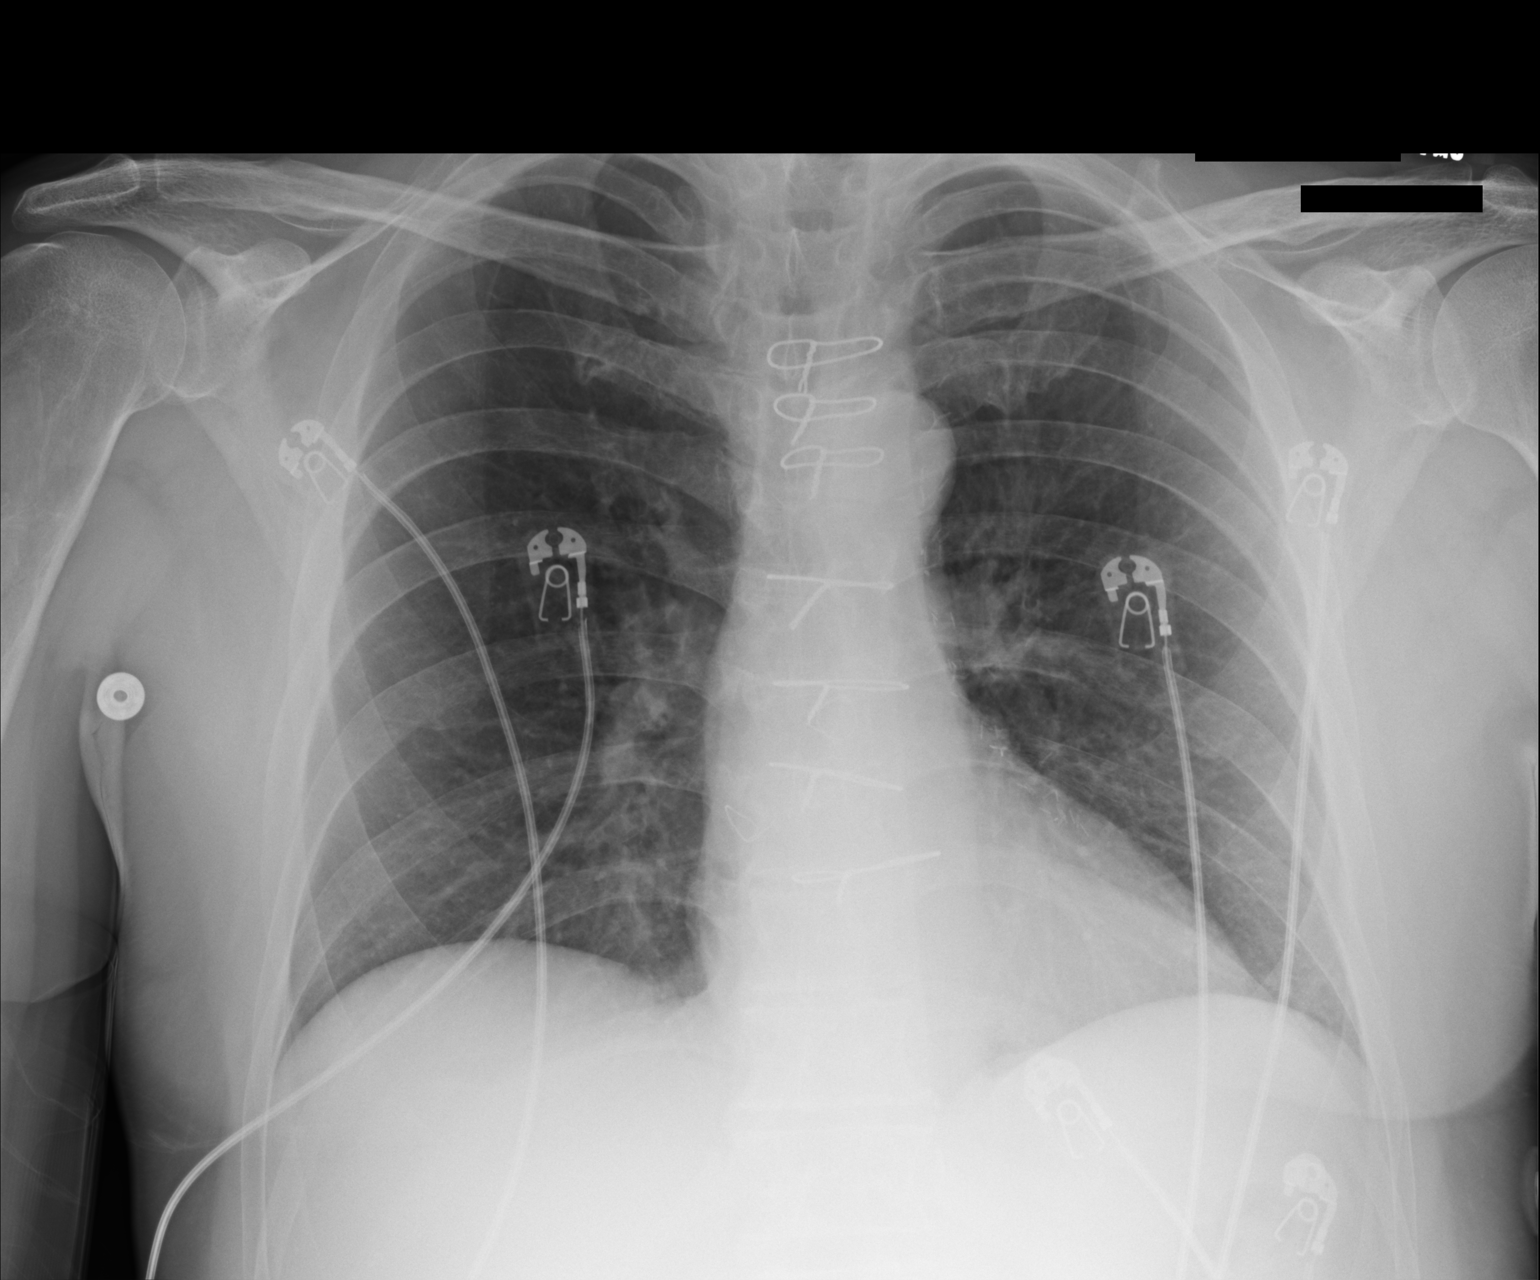

[1 of 1 positions shown; findings below may reference images not displayed]

FINDINGS: The heart size and mediastinal contours are within normal limits.
Sternotomy wires are noted. No pneumothorax or pleural effusion is
noted. Both lungs are clear. The visualized skeletal structures are
unremarkable.
IMPRESSION: No acute cardiopulmonary abnormality seen.

## 2018-07-13 ENCOUNTER — Emergency Department (HOSPITAL_COMMUNITY): Payer: BC Managed Care – PPO

## 2018-07-13 ENCOUNTER — Emergency Department (HOSPITAL_COMMUNITY)
Admission: EM | Admit: 2018-07-13 | Discharge: 2018-07-13 | Disposition: A | Discharge status: Home / Self Care | Payer: BC Managed Care – PPO | Attending: Emergency Medicine | Admitting: Emergency Medicine

## 2018-07-13 ENCOUNTER — Encounter (HOSPITAL_COMMUNITY): Payer: Self-pay

## 2018-07-13 DIAGNOSIS — Z79899 Other long term (current) drug therapy: Secondary | ICD-10-CM | POA: Insufficient documentation

## 2018-07-13 DIAGNOSIS — Z7982 Long term (current) use of aspirin: Secondary | ICD-10-CM | POA: Insufficient documentation

## 2018-07-13 DIAGNOSIS — R1013 Epigastric pain: Secondary | ICD-10-CM | POA: Diagnosis present

## 2018-07-13 DIAGNOSIS — Z87891 Personal history of nicotine dependence: Secondary | ICD-10-CM | POA: Insufficient documentation

## 2018-07-13 DIAGNOSIS — I10 Essential (primary) hypertension: Secondary | ICD-10-CM | POA: Diagnosis not present

## 2018-07-13 DIAGNOSIS — I2510 Atherosclerotic heart disease of native coronary artery without angina pectoris: Secondary | ICD-10-CM | POA: Diagnosis not present

## 2018-07-13 LAB — URINALYSIS, ROUTINE W REFLEX MICROSCOPIC
Bacteria, UA: NONE SEEN
Bilirubin Urine: NEGATIVE
Glucose, UA: NEGATIVE mg/dL
Hgb urine dipstick: NEGATIVE
Ketones, ur: NEGATIVE mg/dL
Nitrite: NEGATIVE
Protein, ur: NEGATIVE mg/dL
Specific Gravity, Urine: 1.024 (ref 1.005–1.030)
pH: 5 (ref 5.0–8.0)

## 2018-07-13 LAB — COMPREHENSIVE METABOLIC PANEL
ALT: 17 U/L (ref 0–44)
AST: 21 U/L (ref 15–41)
Albumin: 4.3 g/dL (ref 3.5–5.0)
Alkaline Phosphatase: 49 U/L (ref 38–126)
Anion gap: 8 (ref 5–15)
BUN: 15 mg/dL (ref 8–23)
CO2: 24 mmol/L (ref 22–32)
Calcium: 9.5 mg/dL (ref 8.9–10.3)
Chloride: 107 mmol/L (ref 98–111)
Creatinine, Ser: 1.21 mg/dL (ref 0.61–1.24)
GFR calc Af Amer: >60 mL/min (ref >60)
GFR calc non Af Amer: >60 mL/min (ref >60)
Glucose, Bld: 105 mg/dL — ABNORMAL HIGH (ref 70–99)
Potassium: 4.3 mmol/L (ref 3.5–5.1)
Sodium: 139 mmol/L (ref 135–145)
Total Bilirubin: 1.1 mg/dL (ref 0.3–1.2)
Total Protein: 7.1 g/dL (ref 6.5–8.1)

## 2018-07-13 LAB — TYPE AND SCREEN
ABO/RH(D): O POS
Antibody Screen: NEGATIVE

## 2018-07-13 LAB — CBC WITH DIFFERENTIAL/PLATELET
Abs Immature Granulocytes: 0.03 10*3/uL (ref 0.00–0.07)
Basophils Absolute: 0.1 10*3/uL (ref 0.0–0.1)
Basophils Relative: 1 %
Eosinophils Absolute: 0.2 10*3/uL (ref 0.0–0.5)
Eosinophils Relative: 2 %
HCT: 45 % (ref 39.0–52.0)
Hemoglobin: 14.6 g/dL (ref 13.0–17.0)
Immature Granulocytes: 0 %
Lymphocytes Relative: 23 %
Lymphs Abs: 1.9 10*3/uL (ref 0.7–4.0)
MCH: 29.9 pg (ref 26.0–34.0)
MCHC: 32.4 g/dL (ref 30.0–36.0)
MCV: 92.2 fL (ref 80.0–100.0)
Monocytes Absolute: 0.6 10*3/uL (ref 0.1–1.0)
Monocytes Relative: 8 %
Neutro Abs: 5.3 10*3/uL (ref 1.7–7.7)
Neutrophils Relative %: 66 %
Platelets: 211 10*3/uL (ref 150–400)
RBC: 4.88 MIL/uL (ref 4.22–5.81)
RDW: 12.6 % (ref 11.5–15.5)
WBC: 8.1 10*3/uL (ref 4.0–10.5)
nRBC: 0 % (ref 0.0–0.2)

## 2018-07-13 LAB — POC OCCULT BLOOD, ED: Fecal Occult Bld: NEGATIVE

## 2018-07-13 LAB — LIPASE, BLOOD: Lipase: 28 U/L (ref 11–51)

## 2018-07-13 MED ORDER — FENTANYL CITRATE (PF) 100 MCG/2ML IJ SOLN
50.0000 ug | Freq: Once | INTRAMUSCULAR | Status: AC
  Administered 2018-07-13: 50 ug via INTRAVENOUS
  Filled 2018-07-13: qty 2

## 2018-07-13 MED ORDER — SUCRALFATE 1 G PO TABS: 1.0000 g | ORAL_TABLET | Freq: Three times a day (TID) | ORAL | 0 refills | Status: DC

## 2018-07-13 MED ORDER — LACTATED RINGERS IV BOLUS
1000.0000 mL | Freq: Once | INTRAVENOUS | Status: AC
  Administered 2018-07-13: 1000 mL via INTRAVENOUS

## 2018-07-13 MED ORDER — IOPAMIDOL (ISOVUE-370) INJECTION 76%
100.0000 mL | Freq: Once | INTRAVENOUS | Status: AC | PRN
  Administered 2018-07-13: 100 mL via INTRAVENOUS

## 2018-07-13 NOTE — ED Triage Notes (Signed)
Pt from home via ems; c/o bright red stools x 3 days, tarry stool this am; nausea w/ no vomiting, lower abd pain and tenderness radiating to chest; given 4mg  zofran pta w/ no relief; upon standing w/ ems pt became very pale and weak;hx tears in esophagus; pt on plavix  132/99 98% RA CBG 126 HR 79

## 2018-07-13 NOTE — ED Notes (Signed)
Patient verbalizes understanding of discharge instructions. Opportunity for questioning and answers were provided. Pt discharged from ED. 

## 2018-07-13 NOTE — ED Provider Notes (Addendum)
[name] Provider Note   CSN: [id number] Arrival date & time: 07/13/18  1937     History   Chief Complaint Chief Complaint  Patient presents with  . Rectal Bleeding    HPI [name] is a 62 y.o. male.  The history is provided by the patient.  Abdominal Pain   This is a new problem. The current episode started 2 days ago. The problem occurs daily. The problem has not changed since onset.The pain is associated with an unknown factor. The pain is located in the epigastric region. The quality of the pain is aching and dull. The pain is at a severity of 2/10. The pain is mild. Associated symptoms include hematochezia and melena. Pertinent negatives include anorexia, fever, belching, diarrhea, nausea, vomiting, constipation, dysuria, frequency, hematuria, headaches, arthralgias and myalgias. Nothing aggravates the symptoms. Nothing relieves the symptoms. Past workup includes GI consult. Past medical history comments: hep C history, recent EGD okay, no varicies.    Past Medical History:  Diagnosis Date  . Anxiety   . Basal cell carcinoma (BCC) of forehead   . CAD (coronary artery disease)    a. 10/2015 ant STEMI >> LHC with 3 v CAD; oLAD tx with POBA >> emergent CABG. b. Multiple evals since that time, early graft failure of SVG-RCA by cath 03/2016. c. 2/19 PCI/DES x1 to pRCA, normal EF.  Marland Kitchen Depression   . Dyspnea   . Ectopic atrial tachycardia ([place])   . Esophageal reflux    eosinophil esophagitis  . Family history of adverse reaction to anesthesia    "sister has PONV" (06/21/2017)  . Former tobacco use   . Gout   . Hepatitis C    "treated and cured" (06/21/2017)  . High cholesterol   . Hypertension   . Ischemic cardiomyopathy    a. EF 25-30% at intraop TEE 4/17  //  b. Limited Echo 5/17 - EF 45-50%, mild ant HK. c. EF 55-65% by cath 09/2017.  . Migraine    "3-4/yr" (06/21/2017)  . Myocardial infarction ([place]) 10/2015  . Sinus  bradycardia    a. HR dropping into 40s in 02/2016 -> BB reduced.  . Stroke ([place]) 10/2016   "small one; sometimes my memory/cognitive issues" (06/21/2017)  . Symptomatic hypotension    a. 02/2016 ER visit -> meds reduced.    Patient Active Problem List   Diagnosis Date Noted  . Palpitations   . Coronary artery disease of bypass graft of native heart with stable angina pectoris ([place])   . Transient loss of consciousness 03/24/2018  . Ectopic atrial tachycardia ([place]) 02/09/2018  . Central chest pain 03/10/2017  . Family hx-stroke 11/10/2016  . Stroke-like episode ([place]) - R brain, s/p tPA 11/09/2016  . Unstable angina ([place]) 09/07/2016  . Claudication of both lower extremities ([place])   . Pure hypercholesterolemia   . Tobacco abuse disorder   . CAD of autologous artery bypass graft without angina   . Chest pain at rest 06/10/2016  . Abnormal nuclear stress test - HIGH RISK 04/20/2016  . Old MI (myocardial infarction)   . Essential hypertension 02/26/2016  . Ischemic cardiomyopathy 12/25/2015  . Hyperlipidemia LDL goal <70 12/25/2015  . 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 Coronary artery disease involving native coronary artery of native heart with unstable angina pectoris ([place]) 11/28/2015  . 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 Acute MI anterior wall first episode care [facility])   . Chest pain with high  risk for cardiac etiology 03/07/2015  . Dysphagia 03/07/2015  . Gout attack 03/07/2015  . Mixed bipolar I disorder ([place]) 03/07/2015  . Fibromyalgia 07/09/2014  . Gout 07/09/2014  . Anxiety 07/09/2014  . Depression 07/09/2014  . Hepatitis C 11/20/2012  . Eosinophilic esophagitis 48/18/5631    Past Surgical History:  Procedure Laterality Date  . BASAL CELL CARCINOMA EXCISION     "forehead  . CARDIAC CATHETERIZATION N/A 11/28/2015   Procedure: Left Heart Cath and Coronary Angiography;  Surgeon: [name], MD;  Location: [place] [facility];  Service: Cardiovascular;   Laterality: N/A;  . CARDIAC CATHETERIZATION N/A 11/28/2015   Procedure: Coronary Balloon Angioplasty;  Surgeon: [name], MD;  Location: [place] [facility];  Service: Cardiovascular;  Laterality: N/A;  ostial LAD  . CARDIAC CATHETERIZATION N/A 11/28/2015   Procedure: Coronary/Graft Angiography;  Surgeon: [name], MD;  Location: [place] [facility];  Service: Cardiovascular;  Laterality: N/A;  coronaries only   . CARDIAC CATHETERIZATION N/A 04/21/2016   Procedure: Left Heart Cath and Coronary Angiography;  Surgeon: Wellington Hampshire, MD;  Location: [place] [facility];  Service: Cardiovascular;  Laterality: N/A;  . CARDIAC CATHETERIZATION N/A 06/14/2016   Procedure: Left Heart Cath and Cors/Grafts Angiography;  Surgeon: Lorretta Harp, MD;  Location: [place] [facility];  Service: Cardiovascular;  Laterality: N/A;  . CARDIAC CATHETERIZATION N/A 09/08/2016   Procedure: Left Heart Cath and Cors/Grafts Angiography;  Surgeon: Wellington Hampshire, MD;  Location: [place] [facility];  Service: Cardiovascular;  Laterality: N/A;  . CARDIAC CATHETERIZATION    . CORONARY ARTERY BYPASS GRAFT N/A 11/28/2015   Procedure: CORONARY ARTERY BYPASS GRAFTING (CABG) TIMES FIVE USING LEFT INTERNAL MAMMARY ARTERY AND RIGHT GREATER SAPHENOUS,VIEN HARVEATED BY ENDOVIEN, INTRAOPPRATIVE TEE;  Surgeon: Gaye Pollack, MD;  Location: [place];  Service: Open Heart Surgery;  Laterality: N/A;  . CORONARY STENT INTERVENTION N/A 10/05/2017   Procedure: CORONARY STENT INTERVENTION;  Surgeon: [name], MD;  Location: [place] [facility];  Service: Cardiovascular;  Laterality: N/A;  . HUMERUS SURGERY Right 1969   "tumor inside bone; filled it w/bone chips"  . LEFT HEART CATH AND CORS/GRAFTS ANGIOGRAPHY N/A 03/11/2017   Procedure: Left Heart Cath and Cors/Grafts Angiography;  Surgeon: Leonie Man, MD;  Location: [place] [facility];  Service: Cardiovascular;  Laterality: N/A;  . LEFT HEART CATH AND CORS/GRAFTS  ANGIOGRAPHY N/A 10/05/2017   Procedure: LEFT HEART CATH AND CORS/GRAFTS ANGIOGRAPHY;  Surgeon: [name], MD;  Location: [place] [facility];  Service: Cardiovascular;  Laterality: N/A;  . PERIPHERAL VASCULAR CATHETERIZATION N/A 06/14/2016   Procedure: Lower Extremity Angiography;  Surgeon: Lorretta Harp, MD;  Location: [place] [facility];  Service: Cardiovascular;  Laterality: N/A;        Home Medications    Prior to Admission medications   Medication Sig Start Date End Date Taking? Authorizing Provider  acetaminophen (TYLENOL) 500 MG tablet Take 1,000 mg by mouth every 6 (six) hours as needed for mild pain or headache.    Yes [provider]  amLODipine (NORVASC) 10 MG tablet Take 1 tablet (10 mg total) by mouth daily. 06/22/17  Yes Strader, Fransisco Hertz, PA-C  aspirin 81 MG tablet Take 81 mg by mouth daily.   Yes [provider]  atorvastatin (LIPITOR) 80 MG tablet Take 1 tablet (80 mg total) by mouth daily. 08/16/17  Yes [name], MD  chlordiazePOXIDE (LIBRIUM) 10 MG capsule Take  10-20 mg by mouth at bedtime as needed for anxiety.    Yes [provider]  clopidogrel (PLAVIX) 75 MG tablet TAKE 1 TABLET BY MOUTH EVERY DAY Patient taking differently: Take 75 mg by mouth daily.  05/26/18  Yes Weaver, Scott T, PA-C  fluticasone (FLONASE) 50 MCG/ACT nasal spray Place 1 spray into both nostrils as needed for allergies.  05/26/17  Yes [provider]  lisinopril (PRINIVIL,ZESTRIL) 5 MG tablet Take 5 mg by mouth daily. May take 1 additional tablet once daily for SBP greater than 150 once hour after taking daily dose Patient taking differently: Take 5 mg by mouth See admin instructions. Take 5 mg by mouth daily. May take 1 additional tablet once daily for SBP greater than 150 once hour after taking daily dose 01/18/18  Yes [name], MD  metoprolol succinate (TOPROL XL) 50 MG 24 hr tablet Take 1 tablet (50 mg total) by mouth as  directed. 50 mg in the AM and 25 mg in the PM Patient taking differently: Take 50 mg by mouth daily.  03/08/18  Yes Weaver, Scott T, PA-C  Multiple Vitamins-Minerals (ONE-A-DAY MENS 50+ ADVANTAGE PO) Take 1 tablet by mouth daily.    Yes [provider]  nitroGLYCERIN (NITROSTAT) 0.4 MG SL tablet Place 1 tablet (0.4 mg total) under the tongue every 5 (five) minutes as needed for chest pain. 3 DOSE MAX 06/22/17  Yes Strader, [place] M, PA-C  pantoprazole (PROTONIX) 40 MG tablet Take 40 mg by mouth daily.   Yes [provider]  ranitidine (ZANTAC) 150 MG tablet Take 150 mg by mouth as needed for heartburn.   Yes [provider]  ranolazine (RANEXA) 1000 MG SR tablet Take 1 tablet (1,000 mg total) by mouth 2 (two) times daily. 02/24/18  Yes Weaver, Scott T, PA-C  VIIBRYD 10 MG TABS Take 10 mg by mouth daily. 06/10/18  Yes [provider]  zolpidem (AMBIEN CR) 12.5 MG CR tablet Take 12.5 mg by mouth at bedtime. 06/10/18  Yes [provider]  sucralfate (CARAFATE) 1 g tablet Take 1 tablet (1 g total) by mouth 4 (four) times daily -  with meals and at bedtime. 07/13/18 08/12/18  Lennice Sites, DO    Family History Family History  Problem Relation Age of Onset  . Lung cancer Mother   . Heart Problems Father   . Heart attack Father 40  . Stroke Father   . Heart failure Father   . Heart attack Maternal Grandmother   . Stroke Maternal Grandmother   . Heart attack Paternal Uncle   . Hypertension Brother   . Autoimmune disease Neg Hx     Social History Social History   Tobacco Use  . Smoking status: Former Smoker    Packs/day: 0.75    Years: 44.00    Pack years: 33.00    Types: Cigarettes    Last attempt to quit: 11/28/2015    Years since quitting: 2.6  . Smokeless tobacco: Never Used  Substance Use Topics  . Alcohol use: No    Frequency: Never    Comment: 06/21/2017 "I'll have a few drinks q couple months"  . Drug use: No    Comment:  06/21/2017 "nothing since the 1980s"     Allergies   Prednisone; Tetanus toxoids; Wellbutrin [bupropion]; Morphine and related; Scallops [shellfish allergy]; and Chantix [varenicline]   Review of Systems Review of Systems  Constitutional: Negative for chills and fever.  HENT: Negative for ear pain and  sore throat.   Eyes: Negative for pain and visual disturbance.  Respiratory: Negative for cough and shortness of breath.   Cardiovascular: Negative for chest pain and palpitations.  Gastrointestinal: Positive for abdominal pain, anal bleeding, blood in stool, hematochezia and melena. Negative for anorexia, constipation, diarrhea, nausea, rectal pain and vomiting.  Genitourinary: Negative for dysuria, frequency and hematuria.  Musculoskeletal: Negative for arthralgias, back pain and myalgias.  Skin: Negative for color change and rash.  Neurological: Negative for seizures, syncope and headaches.  All other systems reviewed and are negative.    Physical Exam Updated Vital Signs  ED Triage Vitals  Enc Vitals Group     BP 07/13/18 0746 (!) 137/91     Pulse Rate 07/13/18 0746 84     Resp 07/13/18 0746 16     Temp 07/13/18 0746 98.2 F (36.8 C)     Temp Source 07/13/18 0746 Oral     SpO2 07/13/18 0746 98 %     Weight --      Height --      Head Circumference --      Peak Flow --      Pain Score 07/13/18 0748 6     Pain Loc --      Pain Edu? --      Excl. in [place]? --     Physical Exam  Constitutional: He is oriented to person, place, and time. He appears well-developed and well-nourished.  HENT:  Head: Normocephalic and atraumatic.  Eyes: Pupils are equal, round, and reactive to light. Conjunctivae and EOM are normal.  Neck: Normal range of motion. Neck supple.  Cardiovascular: Normal rate, regular rhythm, normal heart sounds and intact distal pulses.  No murmur heard. Pulmonary/Chest: Effort normal and breath sounds normal. No respiratory distress.  Abdominal: Soft. He  exhibits no distension and no mass. There is tenderness (TTP in epigastric region ). There is no guarding.  Genitourinary: Rectal exam shows guaiac negative stool (no gross melena or hematochezia on exam, no hemorrhoids, no pain in rectal area or fluctuance).  Musculoskeletal: Normal range of motion. He exhibits no edema.  Neurological: He is alert and oriented to person, place, and time.  Skin: Skin is warm and dry. Capillary refill takes less than 2 seconds.  Psychiatric: He has a normal mood and affect.  Nursing note and vitals reviewed.    ED Treatments / Results  Labs (all labs ordered are listed, but only abnormal results are displayed) Labs Reviewed  COMPREHENSIVE METABOLIC PANEL - Abnormal; Notable for the following components:      Result Value   Glucose, Bld 105 (*)    All other components within normal limits  URINALYSIS, ROUTINE W REFLEX MICROSCOPIC - Abnormal; Notable for the following components:   Leukocytes, UA SMALL (*)    All other components within normal limits  LIPASE, BLOOD  CBC WITH DIFFERENTIAL/PLATELET  OCCULT BLOOD X 1 CARD TO LAB, STOOL  POC OCCULT BLOOD, ED  TYPE AND SCREEN    EKG EKG Interpretation  Date/Time:  Thursday July 13 2018 07:45:23 EST Ventricular Rate:  74 PR Interval:    QRS Duration: 101 QT Interval:  389 QTC Calculation: 432 R Axis:   73 Text Interpretation:  Sinus rhythm Low voltage, precordial leads Confirmed by Lennice Sites [phone]) on 07/13/2018 8:01:41 AM   Radiology Ct Abdomen Pelvis W Contrast  Result Date: 07/13/2018 CLINICAL DATA:  Low abd pain, blood in stool x 2 3 days No prior abd surgery EXAM: CT  ABDOMEN AND PELVIS WITH CONTRAST TECHNIQUE: Multidetector CT imaging of the abdomen and pelvis was performed using the standard protocol following bolus administration of intravenous contrast. CONTRAST:  138mL ISOVUE-370 IOPAMIDOL (ISOVUE-370) INJECTION 76% COMPARISON:  CT of the abdomen and pelvis on 07/09/2014  FINDINGS: Lower chest: Partially imaged coronary stent and coronary artery calcifications. Heart size is normal. Lung bases are unremarkable. Hepatobiliary: No focal liver abnormality is seen. No radiopaque gallstones, biliary dilatation, or pericholecystic inflammatory changes. Pancreas: Unremarkable. No pancreatic ductal dilatation or surrounding inflammatory changes. Spleen: Normal in size without focal abnormality. Adrenals/Urinary Tract: Adrenal glands are normal in appearance. LEFT renal cyst is 12 millimeters. The ureters are unremarkable. The bladder and visualized portion of the urethra are normal. Stomach/Bowel: The stomach and small bowel loops are normal in appearance. The appendix is well seen and has a normal appearance. There are numerous colonic diverticula. No acute diverticulitis. Vascular/Lymphatic: There is moderate atherosclerotic calcification of the abdominal aorta. No associated aneurysm. Although involved by atherosclerosis, there is vascular opacification of the celiac axis, superior mesenteric artery, and inferior mesenteric artery. Normal appearance of the portal venous system and inferior vena cava. No retroperitoneal or mesenteric adenopathy. Reproductive: Prostate is unremarkable. Other: No abdominal wall hernia or abnormality. No abdominopelvic ascites. Musculoskeletal: No acute or significant osseous findings. IMPRESSION: 1.  No evidence for acute  abnormality. 2. Colonic diverticulosis without acute diverticulitis. 3. Coronary artery stents/coronary artery calcifications. 4.  Aortic atherosclerosis.  (ICD10-I70.0) 5. Benign LEFT renal cyst. Electronically Signed   By: Nolon Nations M.D.   On: 07/13/2018 10:12   Dg Chest Portable 1 View  Result Date: 07/13/2018 CLINICAL DATA:  Pain and hypertension EXAM: PORTABLE CHEST 1 VIEW COMPARISON:  June 09, 2018 FINDINGS: No edema or consolidation. Heart is upper normal in size with pulmonary vascularity normal. There is aortic  atherosclerosis. Patient is status post coronary artery bypass grafting. Monitor device noted anteriorly. No evident bone lesions. IMPRESSION: Status post coronary artery bypass grafting. No edema or consolidation. Heart upper normal in size. There is aortic atherosclerosis. Aortic Atherosclerosis (ICD10-I70.0). Electronically Signed   By: Lowella Grip III M.D.   On: 07/13/2018 08:33    Procedures Procedures (including critical care time)  Medications Ordered in ED Medications  lactated ringers bolus 1,000 mL (0 mLs Intravenous Stopped 07/13/18 0906)  fentaNYL (SUBLIMAZE) injection 50 mcg (50 mcg Intravenous Given 07/13/18 0843)  iopamidol (ISOVUE-370) 76 % injection 100 mL (100 mLs Intravenous Contrast Given 07/13/18 1005)     Initial Impression / Assessment and Plan / ED Course  I have reviewed the triage vital signs and the nursing notes.  Pertinent labs & imaging results that were available during my care of the patient were reviewed by me and considered in my medical decision making (see chart for details).     Jondavid Schreier is a 62 year old male with history of CAD, prior history of hepatitis C who presents to the ED with blood in his stool.  Patient with unremarkable vitals.  No fever.  Patient states that he has had some intermittent bright red blood in his stools and possibly some black stools today.  Patient is on Plavix.  Follows up with GI regularly and had an EGD that did not show any issues.  No varices that he is aware of. No ETOH abuse or motrin use. No hx of ulcer. Patient has some mild epigastric abdominal pain.  Denies any recent antibiotic use.  Denies any chest pain, shortness of breath, lightheadedness.  Patient  with mild tenderness in epigastric region on exam.  Clear breath sounds.  No gross melena or hematochezia on rectal exam.  No obvious hemorrhoids or rectal tenderness on exam.  Overall patient is well-appearing.  Will obtain lab work including hemoglobin.   Will obtain chest x-ray and start IV fluids.  Low concern for GI bleed at this time given rectal exam.  Will evaluate for pancreatitis, hepatobiliary process.  Possibly bleed from polyps, diverticular process.  Will reevaluate. No peritonitis on exam.  EKG with no ischemic changes.  Doubt cardiac process, no chest pain.  Patient with negative Hemoccult.  Hemoglobin stable.  No significant anemia, electrolyte abnormality, leukocytosis.  Patient with normal lipase and doubt pancreatitis.  CT scan showed no acute findings.  Patient does have some chronic diverticulosis.  No free air, no perforation. No ischemic bowel changes. Patient with likely some gastritis and will prescribe Carafate.  No concern for recurrent GI bleed at this time.  Recommend follow-up with his GI doctor and given return precautions.  Patient discharged from ED in good condition.  This chart was dictated using voice recognition software.  Despite best efforts to proofread,  errors can occur which can change the documentation meaning.   Final Clinical Impressions(s) / ED Diagnoses   Final diagnoses:  Epigastric pain    ED Discharge Orders         Ordered    sucralfate (CARAFATE) 1 g tablet  3 times daily with meals & bedtime     07/13/18 1030           Sherise Geerdes, DO 07/13/18 [place], [place], DO 07/13/18 1035

## 2018-07-13 NOTE — ED Notes (Signed)
Patient transported to CT 

## 2018-07-20 ENCOUNTER — Ambulatory Visit: Payer: Self-pay | Admitting: Physician Assistant

## 2018-08-06 ENCOUNTER — Other Ambulatory Visit: Payer: Self-pay | Admitting: Student

## 2018-08-07 ENCOUNTER — Other Ambulatory Visit: Payer: Self-pay | Admitting: Student

## 2018-08-10 NOTE — ED Provider Notes (Signed)
This is an addendum to attestation for visit on 10/25 for migraine headache.  Physical exam from resident missing.  Patient's physical exam  Well appearing, no distress. Moist membranes. Heart RRR.  Lungs clear.   Skin no rashes. Abd soft, non tender. 5+ strength in UE and LE with f/e at major joints. Sensation to palpation intact in UE and LE. CNs 2-12 grossly intact.  EOMFI.  PERRL.   Finger nose and coordination intact bilateral.   Visual fields intact to finger testing. No nystagmus No meningismus.    Pt improved in ED, normal neuro exam, CT no acute findings, out patient follow up.   Golda Acre, MD 08/10/18 [phone]

## 2018-08-16 ENCOUNTER — Encounter: Payer: Self-pay | Admitting: Physician Assistant

## 2018-08-16 NOTE — Progress Notes (Deleted)
Cardiology Office Note    Date:  08/16/2018  ID:  [name], DOB 12/09/1955, MRN [id number] PCP:  Anthony Melter, MD  Cardiologist:  Anthony Grooms, MD   Chief Complaint: f/u after monitor  History of Present Illness:  Per [name] is a 62 y.o. 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 I met him in 2017.   To recap, at time of MI in 10/2015, EF was 25-30%. Follow-up echocardiogram in May 2017 demonstrated improved LV function with EF 45-50%. He has had multiple cardiac catheterization since his bypass surgery with known occluded vein graft to the diagonal and moderate disease in the SVG-RCA. In 10/2016 he sustained a R brain stroke and OP event monitoring was unremarkable. He underwent PCI with DES to the native RCA in February 2019. He was admitted 01/2018 with palpitations and chest pain. Troponin levels remained negative. Telemetry demonstrated ectopic atrial tachycardia. Beta-blocker dose was adjusted. Chest CTA was negative for aortic aneurysm or dissection, pulmonary embolism. There was a 2 mm nodular opacity in the anterior segment left upper lobe. Follow-up noncontrast chest CT was recommended in 12 months (01/2019). It was suspected that his pain was referred from lumbar disc disease. At last OV 01/2018 with [name], Ranexa was increased to 1050m BID and metoprolol to 1051mdaily with recommended f/u in 6 weeks that did not occur. He was admitted 02/2018 for syncope, possibly vasovagal, one of which occurred in ER but no associated arrhythmias noted during that stay  per hospitalist note. Echo 02/2018 showed EF 60-65% otherwise no acute findings. Carotid duplex 02/2018 40-59% BICA. Has had multiple ER visits since that time for HTN, headache, chest pain, dark stools. Last labs 06/2018 showed K 4.3, Cr 1.21, Hgb 14.6, Plt 211, LFTs OK, 05/2018 BNP wnl, 04/2018 d-dimer wnl, 10/2017 LDL 39. Repeat event monitor for syncope 05/2018 showed sinus brady-sinus tachy without specific arrhythmias.    loop for arrhythmia, stroke  CAD Ischemic cardiomyopathy Palpitations Syncope Carotid artery disease Pulmonary nodule 01/2019    Past Medical History:  Diagnosis Date  . Anxiety   . Basal cell carcinoma (BCC) of forehead   . CAD (coronary artery disease)    a. 10/2015 ant STEMI >> LHC with 3 v CAD; oLAD tx with POBA >> emergent CABG. b. Multiple evals since that time, early graft failure of SVG-RCA by cath 03/2016. c. 2/19 PCI/DES x1 to pRCA, normal EF.  . Carotid artery disease ([facility]   a. 40-59% BICA 02/2018.  . [place]land Kitchenepression   . Dyspnea   . Ectopic atrial tachycardia ([facility]  . Esophageal reflux    eosinophil esophagitis  . Family history of adverse reaction to anesthesia    "sister has PONV" (06/21/2017)  . Former tobacco use   . Gout   . Hepatitis C    "treated and cured" (06/21/2017)  . High cholesterol   . Hypertension   . Ischemic cardiomyopathy    a. EF 25-30% at intraop TEE 4/17  //  b. Limited Echo 5/17 - EF 45-50%, mild ant HK. c. EF 55-65% by cath 09/2017.  . Migraine    "3-4/yr" (06/21/2017)  .  Myocardial infarction ([place]) 10/2015  . Palpitations   . Sinus bradycardia    a. HR dropping into 40s in 02/2016 -> BB reduced.  . Stroke ([place]) 10/2016   "small one; sometimes my memory/cognitive issues" (06/21/2017)  . Symptomatic hypotension    a. 02/2016 ER visit -> meds reduced.  . Syncope     Past Surgical History:  Procedure Laterality Date  . BASAL CELL CARCINOMA EXCISION     "forehead  . CARDIAC CATHETERIZATION N/A 11/28/2015   Procedure:  Left Heart Cath and Coronary Angiography;  Surgeon: Anthony Booze, MD;  Location: [facility];  Service: Cardiovascular;  Laterality: N/A;  . CARDIAC CATHETERIZATION N/A 11/28/2015   Procedure: Coronary Balloon Angioplasty;  Surgeon: Anthony Booze, MD;  Location: [facility];  Service: Cardiovascular;  Laterality: N/A;  ostial LAD  . CARDIAC CATHETERIZATION N/A 11/28/2015   Procedure: Coronary/Graft Angiography;  Surgeon: Anthony Booze, MD;  Location: [facility];  Service: Cardiovascular;  Laterality: N/A;  coronaries only   . CARDIAC CATHETERIZATION N/A 04/21/2016   Procedure: Left Heart Cath and Coronary Angiography;  Surgeon: Anthony Hampshire, MD;  Location: [facility];  Service: Cardiovascular;  Laterality: N/A;  . CARDIAC CATHETERIZATION N/A 06/14/2016   Procedure: Left Heart Cath and Cors/Grafts Angiography;  Surgeon: Anthony Harp, MD;  Location: [facility];  Service: Cardiovascular;  Laterality: N/A;  . CARDIAC CATHETERIZATION N/A 09/08/2016   Procedure: Left Heart Cath and Cors/Grafts Angiography;  Surgeon: Anthony Hampshire, MD;  Location: [facility];  Service: Cardiovascular;  Laterality: N/A;  . CARDIAC CATHETERIZATION    . CORONARY ARTERY BYPASS GRAFT N/A 11/28/2015   Procedure: CORONARY ARTERY BYPASS GRAFTING (CABG) TIMES FIVE USING LEFT INTERNAL MAMMARY ARTERY AND RIGHT GREATER SAPHENOUS,VIEN HARVEATED BY ENDOVIEN, INTRAOPPRATIVE TEE;  Surgeon: Anthony Pollack, MD;  Location: [facility];  Service: Open Heart Surgery;  Laterality: N/A;  . CORONARY STENT INTERVENTION N/A 10/05/2017   Procedure: CORONARY STENT INTERVENTION;  Surgeon: Anthony Booze, MD;  Location: [facility];  Service: Cardiovascular;  Laterality: N/A;  . HUMERUS SURGERY Right 1969   "tumor inside bone; filled it w/bone chips"  . LEFT HEART CATH AND CORS/GRAFTS ANGIOGRAPHY N/A 03/11/2017   Procedure: Left Heart Cath and Cors/Grafts Angiography;   Surgeon: Anthony Man, MD;  Location: [facility];  Service: Cardiovascular;  Laterality: N/A;  . LEFT HEART CATH AND CORS/GRAFTS ANGIOGRAPHY N/A 10/05/2017   Procedure: LEFT HEART CATH AND CORS/GRAFTS ANGIOGRAPHY;  Surgeon: Anthony Booze, MD;  Location: [facility];  Service: Cardiovascular;  Laterality: N/A;  . PERIPHERAL VASCULAR CATHETERIZATION N/A 06/14/2016   Procedure: Lower Extremity Angiography;  Surgeon: Anthony Harp, MD;  Location: [facility];  Service: Cardiovascular;  Laterality: N/A;    Current Medications: No outpatient medications have been marked as taking for the 08/17/18 encounter (Appointment) with [name], PA-C.   ***   Allergies:   Prednisone; Tetanus toxoids; Wellbutrin [bupropion]; Morphine and related; Scallops [shellfish allergy]; and Chantix [varenicline]   Social History   Socioeconomic History  . Marital status: Married    Spouse name: [name]  . Number of children: 3  . Years of education: College  . Highest education level: Not on file  Occupational History  . Occupation: Scientist, research (physical sciences): SELF-EMPLOYED  Social Needs  . Financial resource strain: Not on file  . Food insecurity:    Worry:  Not on file    Inability: Not on file  . Transportation needs:    Medical: Not on file    Non-medical: Not on file  Tobacco Use  . Smoking status: Former Smoker    Packs/day: 0.75    Years: 44.00    Pack years: 33.00    Types: Cigarettes    Last attempt to quit: 11/28/2015    Years since quitting: 2.7  . Smokeless tobacco: Never Used  Substance and Sexual Activity  . Alcohol use: No    Frequency: Never    Comment: 06/21/2017 "I'll have a few drinks q couple months"  . Drug use: No    Comment: 06/21/2017 "nothing since the 1980s"  . Sexual activity: Yes  Lifestyle  . Physical activity:    Days per week: Not on file    Minutes per session: Not on file  . Stress: Not on file  Relationships  . Social  connections:    Talks on phone: Not on file    Gets together: Not on file    Attends religious service: Not on file    Active member of club or organization: Not on file    Attends meetings of clubs or organizations: Not on file    Relationship status: Not on file  Other Topics Concern  . Not on file  Social History Narrative   Patient lives at home with his spouse.   Caffeine Use: yes     Family History:  The patient's ***family history includes Heart Problems in his father; Heart attack in his maternal grandmother and paternal uncle; Heart attack (age of onset: 38) in his father; Heart failure in his father; Hypertension in his brother; Lung cancer in his mother; Stroke in his father and maternal grandmother. There is no history of Autoimmune disease.  ROS:   Please see the history of present illness. Otherwise, review of systems is positive for ***.  All other systems are reviewed and otherwise negative.    PHYSICAL EXAM:   VS:  There were no vitals taken for this visit.  BMI: There is no height or weight on file to calculate BMI. GEN: Well nourished, well developed, in no acute distress HEENT: normocephalic, atraumatic Neck: no JVD, carotid bruits, or masses Cardiac: ***RRR; no murmurs, rubs, or gallops, no edema  Respiratory:  clear to auscultation bilaterally, normal work of breathing GI: soft, nontender, nondistended, + BS MS: no deformity or atrophy Skin: warm and dry, no rash Neuro:  Alert and Oriented x 3, Strength and sensation are intact, follows commands Psych: euthymic mood, full affect  Wt Readings from Last 3 Encounters:  06/23/18 195 lb (88.5 kg)  05/15/18 190 lb (86.2 kg)  03/26/18 196 lb 6.4 oz (89.1 kg)      Studies/Labs Reviewed:   EKG:  EKG was ordered today and personally reviewed by me and demonstrates *** EKG was not ordered today.***  Recent Labs: 02/07/2018: Magnesium 2.1; TSH 1.319 06/09/2018: B Natriuretic Peptide 18.5 07/13/2018: ALT 17;  BUN 15; Creatinine, Ser 1.21; Hemoglobin 14.6; Platelets 211; Potassium 4.3; Sodium 139   Lipid Panel    Component Value Date/Time   CHOL 114 11/08/2017 0833   TRIG 93 11/08/2017 0833   HDL 52 11/08/2017 0833   CHOLHDL 2.2 11/08/2017 0833   CHOLHDL 2.2 06/22/2017 0256   VLDL 27 06/22/2017 0256   LDLCALC 43 11/08/2017 0833    Additional studies/ records that were reviewed today include: Summarized above.***    ASSESSMENT & PLAN:  1. ***  Disposition: F/u with ***   Medication Adjustments/Labs and Tests Ordered: Current medicines are reviewed at length with the patient today.  Concerns regarding medicines are outlined above. Medication changes, Labs and Tests ordered today are summarized above and listed in the Patient Instructions accessible in Encounters.   Signed, [name], PA-C  08/16/2018 6:19 PM    [facility] [address], [place], [place]  [address] Phone: [phone]; Fax: [phone]

## 2018-08-17 ENCOUNTER — Ambulatory Visit: Payer: Self-pay | Admitting: Physician Assistant

## 2018-09-01 IMAGING — CR DG CHEST 2V
2 series · 2 of 2 positions shown · non-contrast
Comparison: None.

CLINICAL DATA: Left chest pain

EXAM:
CHEST  2 VIEW

[chest pa]
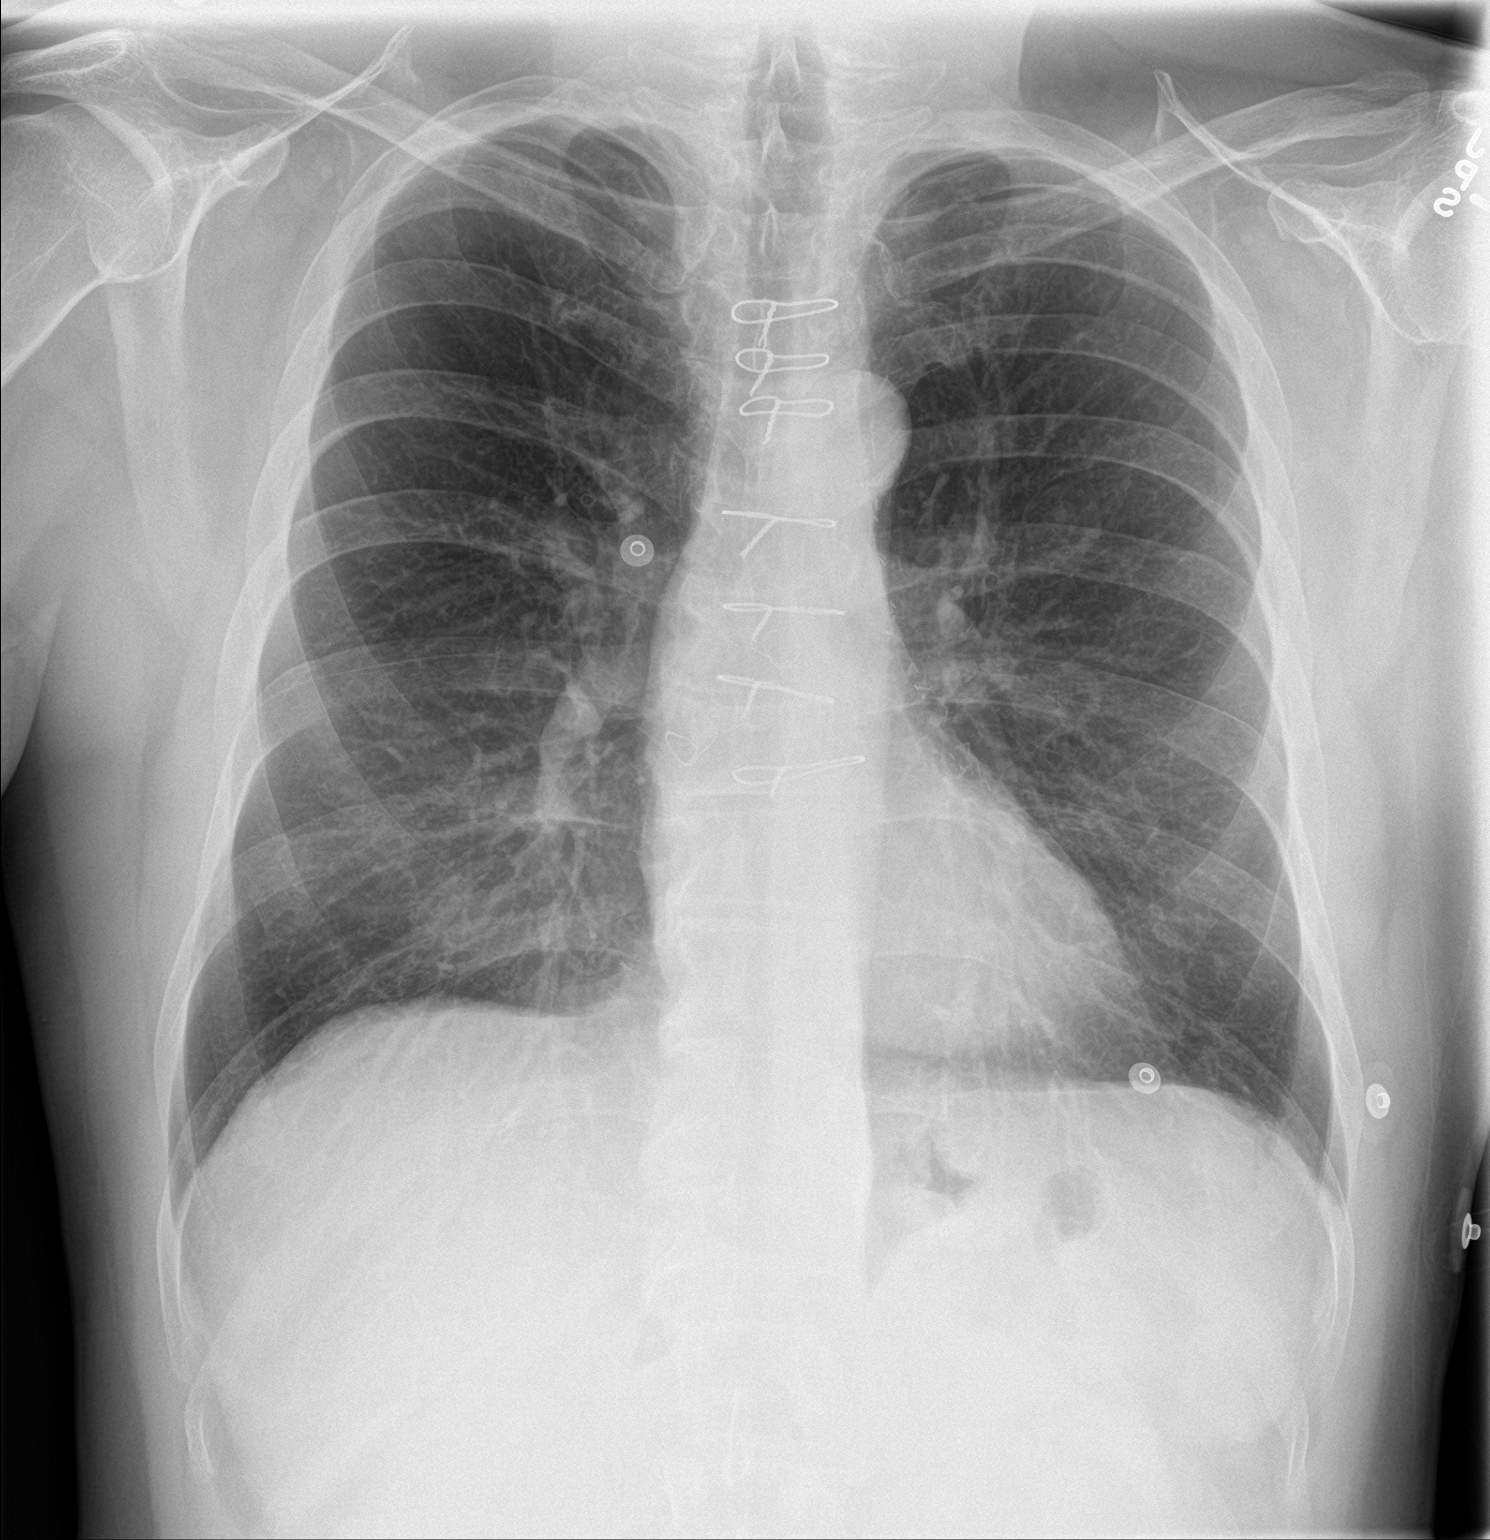

[chest lat]
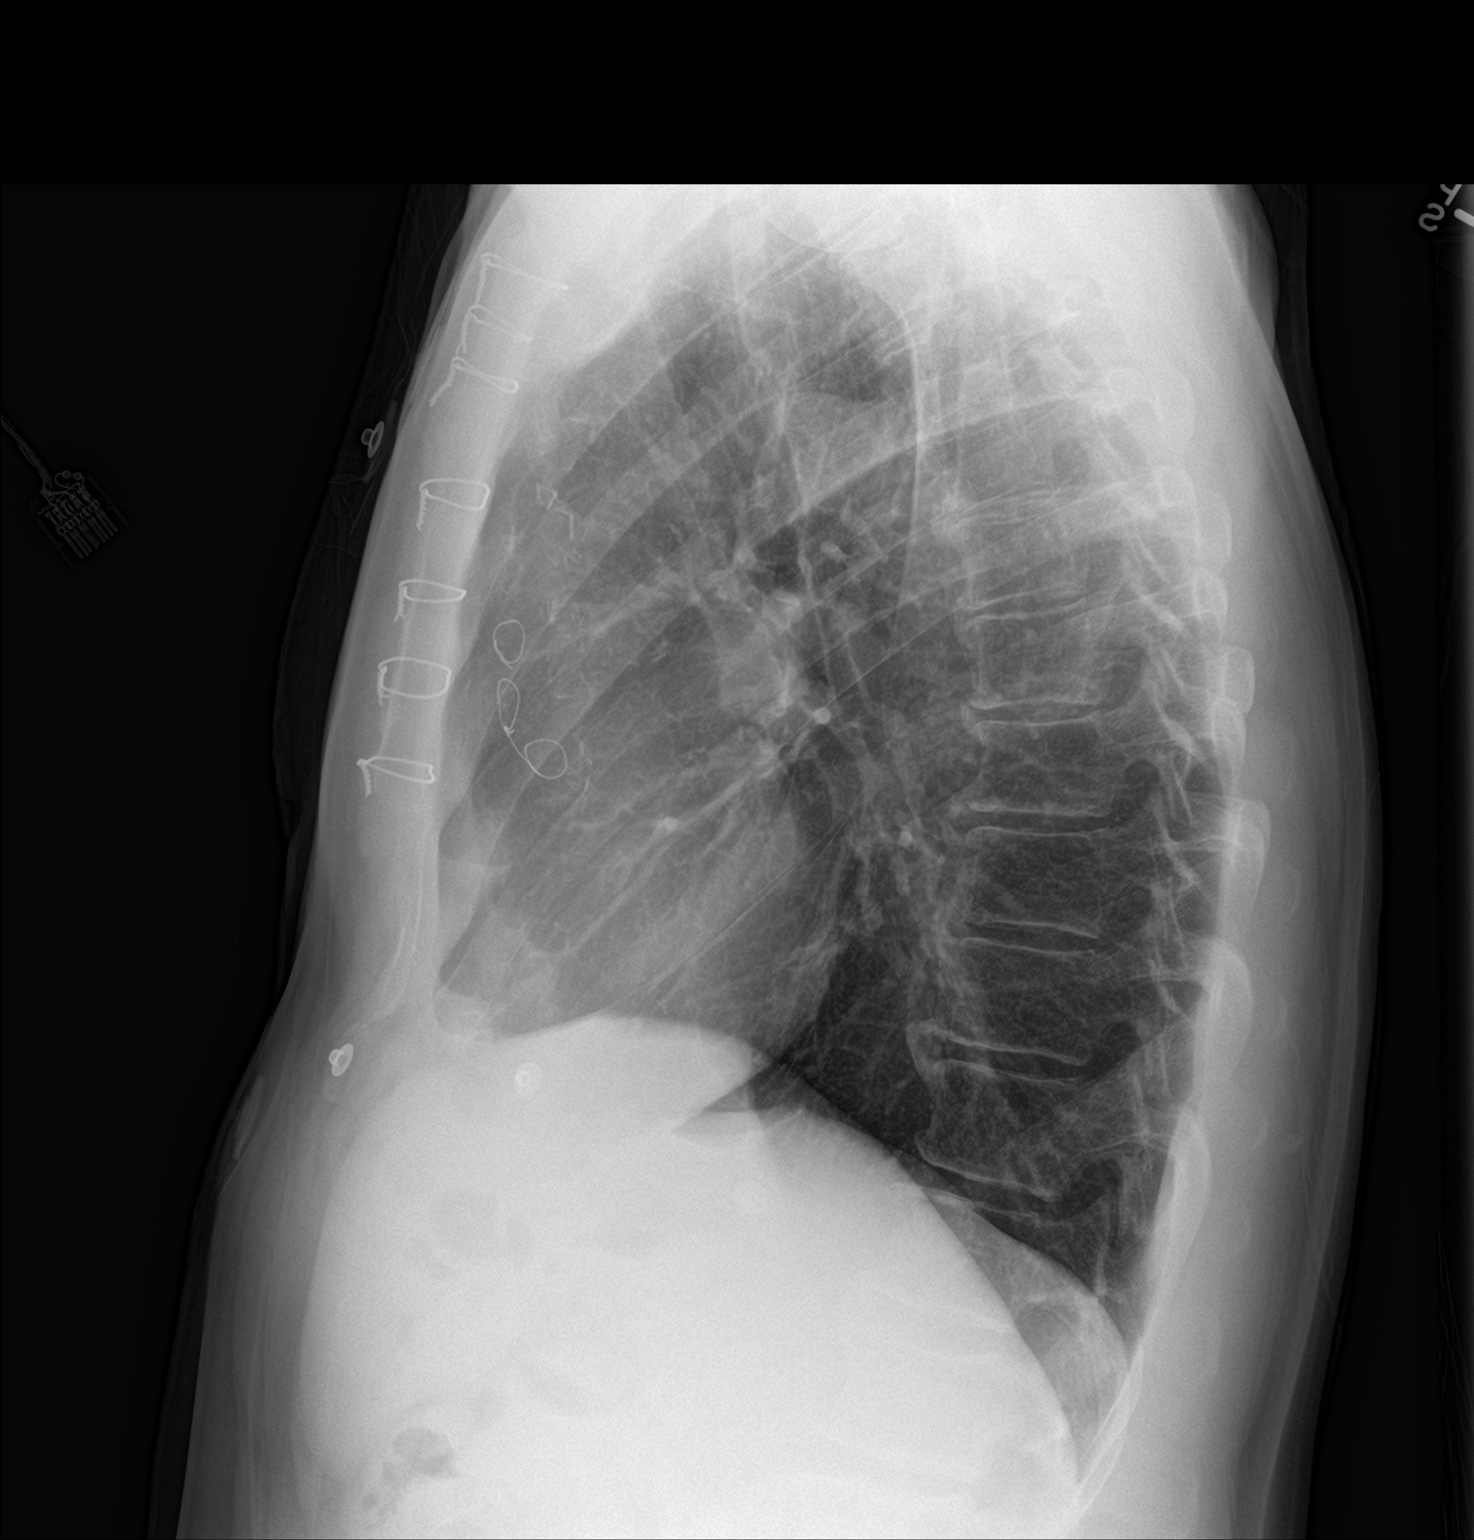

[2 of 2 positions shown; findings below may reference images not displayed]

FINDINGS: There is no focal parenchymal opacity. There is no pleural effusion
or pneumothorax. The heart size is normal. There is evidence of
prior CABG.

The osseous structures are unremarkable.
IMPRESSION: No active cardiopulmonary disease.

## 2018-09-01 IMAGING — CT CT HEAD W/O CM
4 series · 17 of 47 positions shown, 19 images · non-contrast
Comparison: 02/01/2014

CLINICAL DATA: Occasional dizziness, difficulty with concentration,
speech changes for few weeks

EXAM:
CT HEAD WITHOUT CONTRAST
TECHNIQUE: Contiguous axial images were obtained from the base of the skull
through the vertex without intravenous contrast.

[Series 3: head without · axial · non-contrast · 0.43mm/px · z∈[-125,+0]mm · 7 of 35 slices shown, 9 images]
[im 5/35  brain]
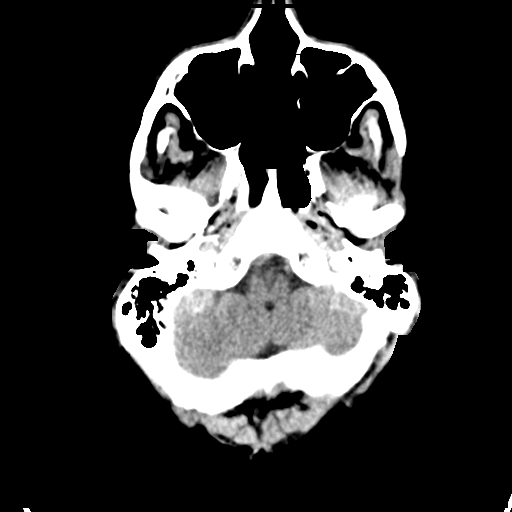
[im 5/35  bone]
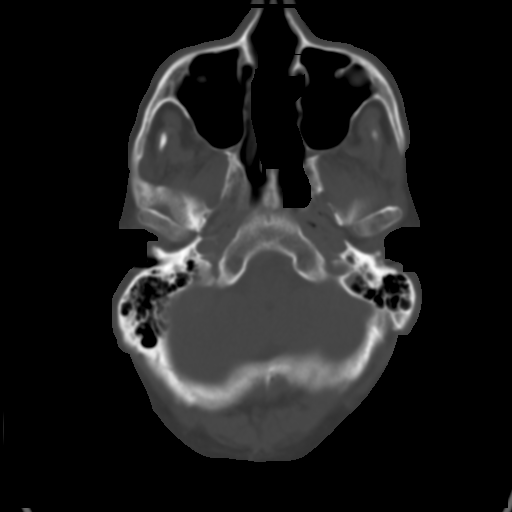
[im 9/35  brain]
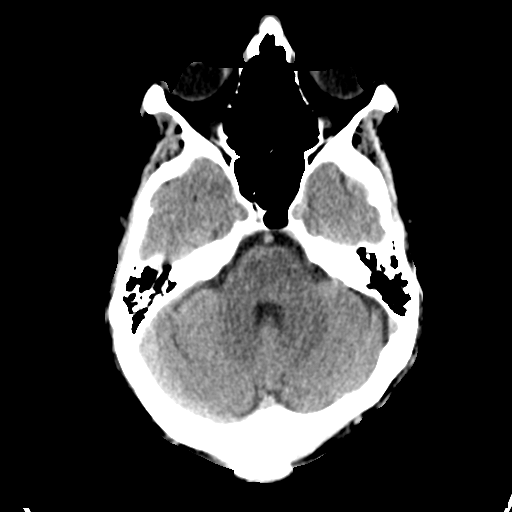
[im 13/35  brain]
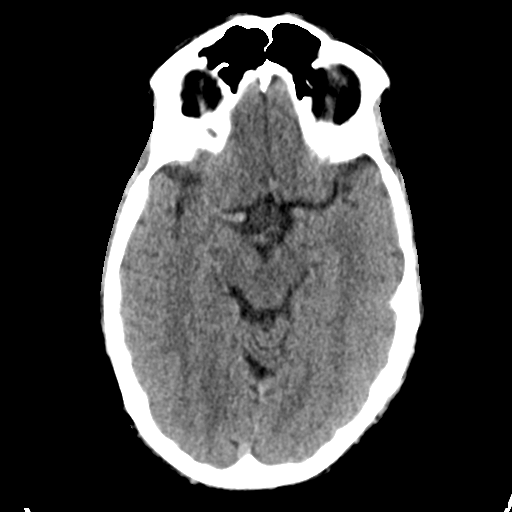
[im 18/35  brain]
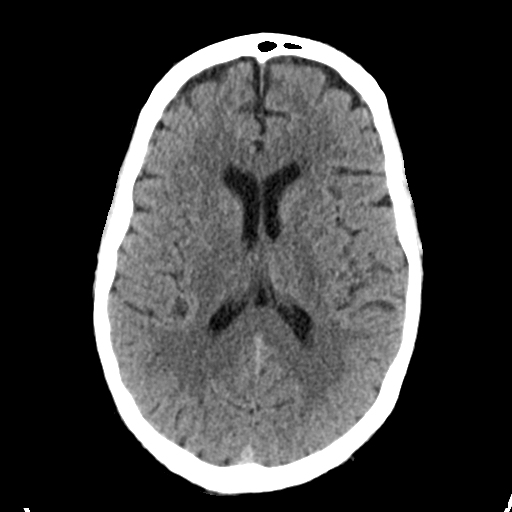
[im 22/35  brain]
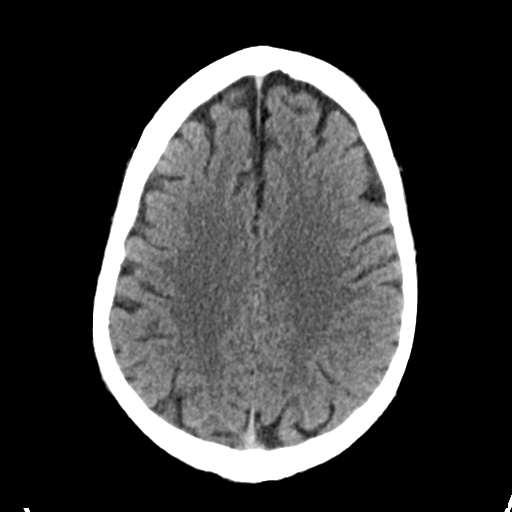
[im 22/35  bone]
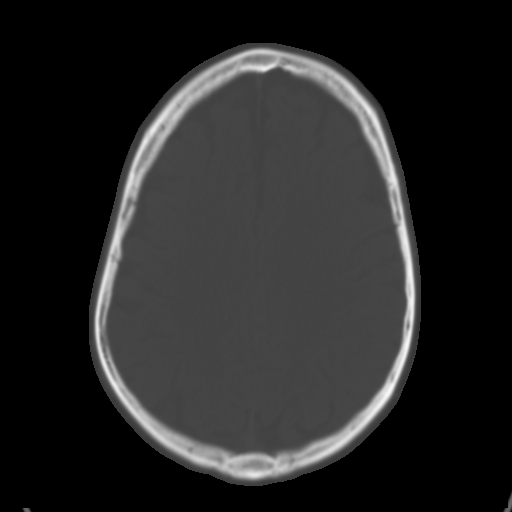
[im 26/35  brain]
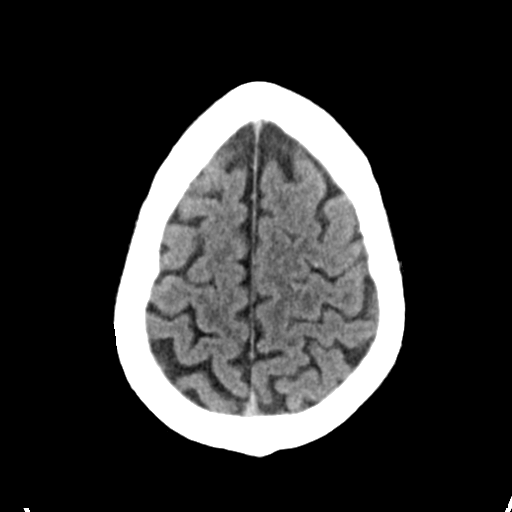
[im 30/35  brain]
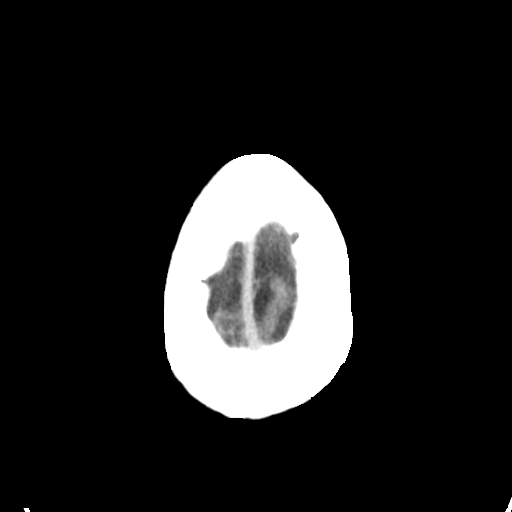

[Series 4: head bone · axial · 0.43mm/px · z∈[-129,-69]mm · 4 of 86 slices shown]
[im 9/86  bone]
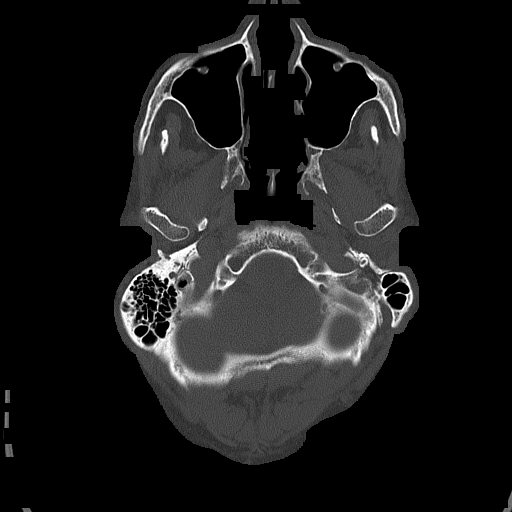
[im 18/86  bone]
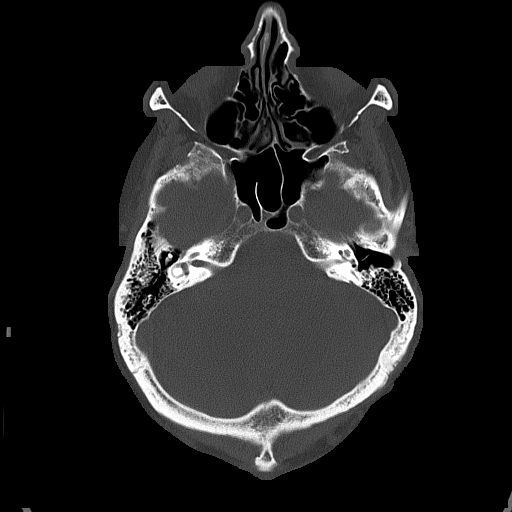
[im 26/86  bone]
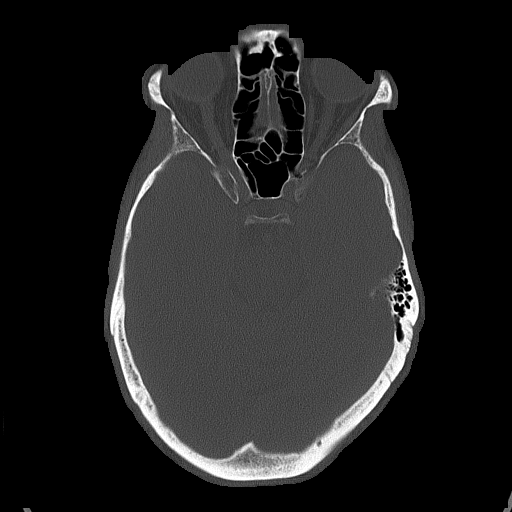
[im 39/86  bone]
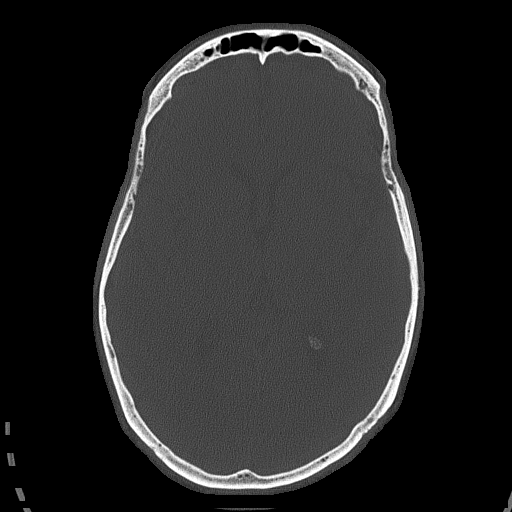

[Series 5: head without cor · coronal · non-contrast · 0.32mm/px · 3 of 73 slices shown]
[im 25/73  brain]
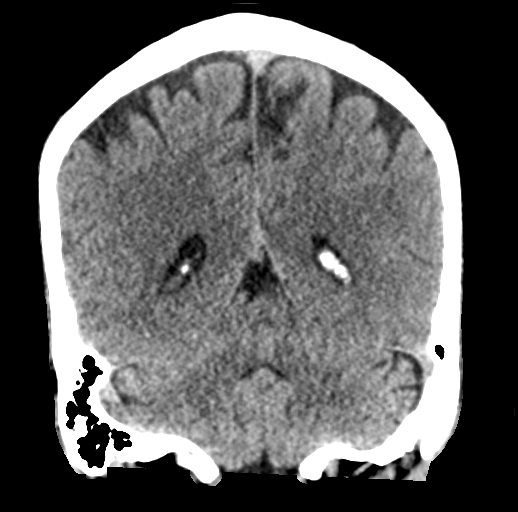
[im 33/73  brain]
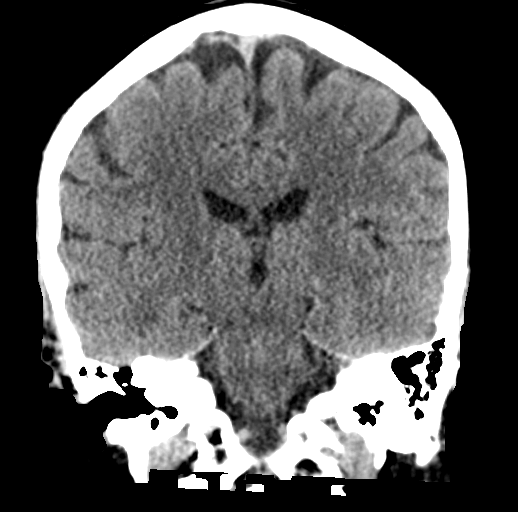
[im 41/73  brain]
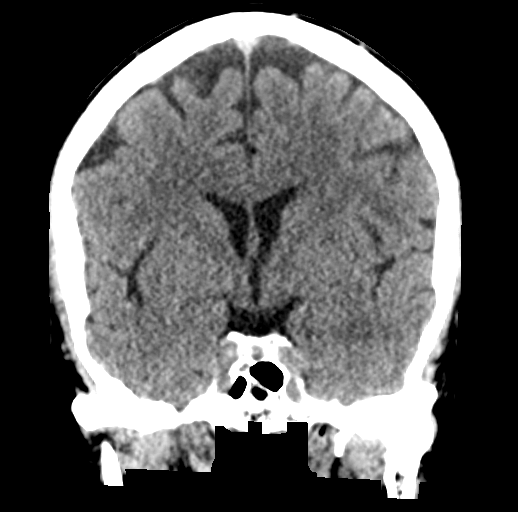

[Series 6: head without sag · sagittal · non-contrast · 0.33mm/px · 3 of 52 slices shown]
[im 18/52  brain]
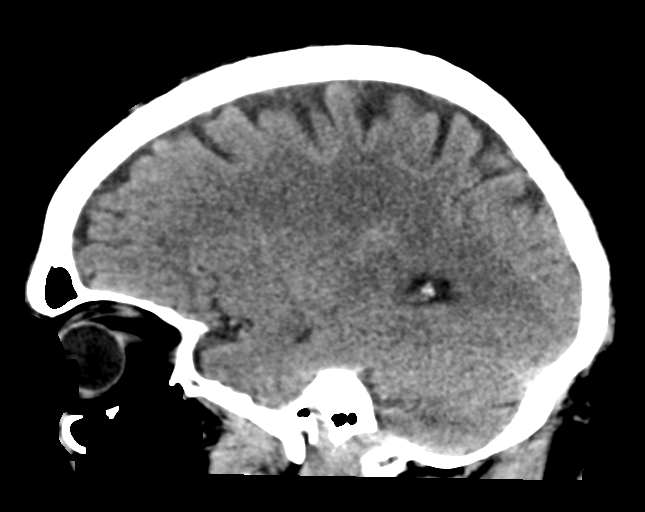
[im 26/52  brain]
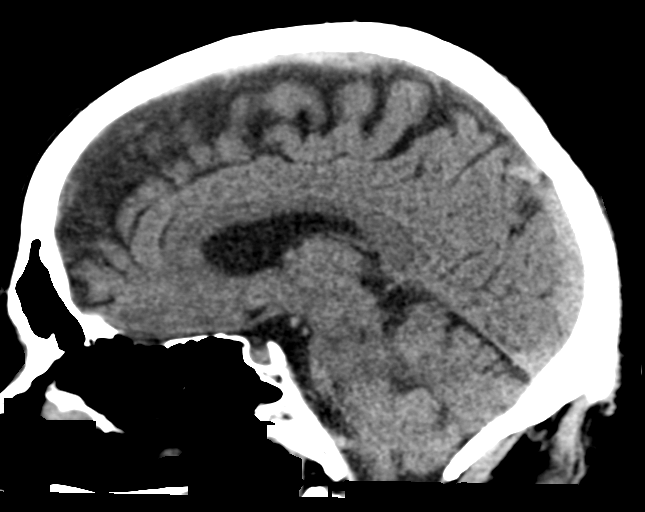
[im 35/52  brain]
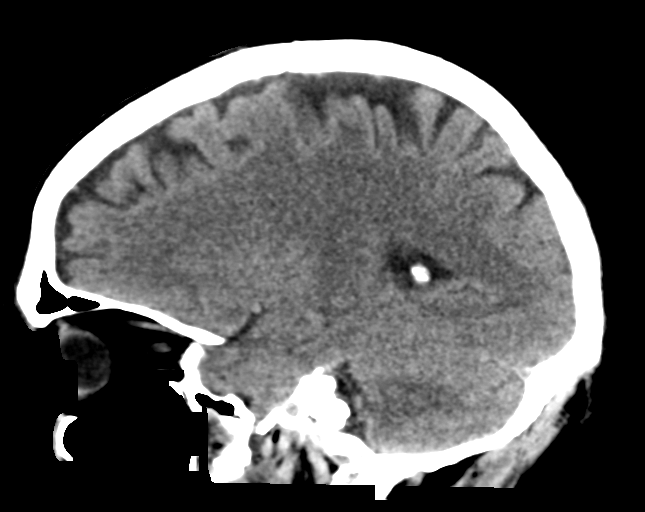

[17 of 47 positions shown; findings below may reference images not displayed]

FINDINGS: Brain: No intracranial hemorrhage, mass effect or midline shift. No
acute cortical infarction. No hydrocephalus. The gray and
white-matter differentiation is preserved. No mass lesion is noted
on this unenhanced scan.

Vascular: Subtle mild atherosclerotic calcifications of carotid
siphon.

Skull: No skull fracture.  No acute findings.

Sinuses/Orbits: No paranasal sinuses air-fluid levels. No
intraorbital hematoma.

Other: None
IMPRESSION: No acute intracranial abnormality. No definite acute cortical
infarction.

## 2018-09-09 IMAGING — MR MR HEAD W/O CM
9 of 10 series · 35 of 48 positions shown · non-contrast
Comparison: CTA head and neck 3776 hours on 11/09/2016, and
earlier.

CLINICAL DATA: 60-year-old male code stroke status post IV tPA on
11/09/2016. Left side weakness.

EXAM:
MRI HEAD WITHOUT CONTRAST
TECHNIQUE: Multiplanar, multiecho pulse sequences of the brain and surrounding
structures were obtained without intravenous contrast.

[Series 3: T1 · sagittal · 5.0mm · 0.47mm/px · 2 of 23 slices shown]
[im 1/23]
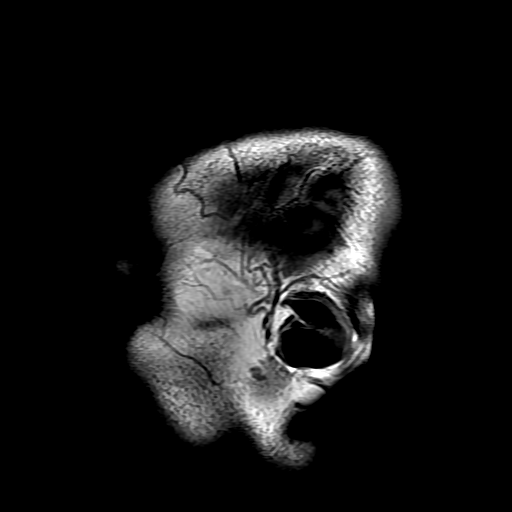
[im 23/23]
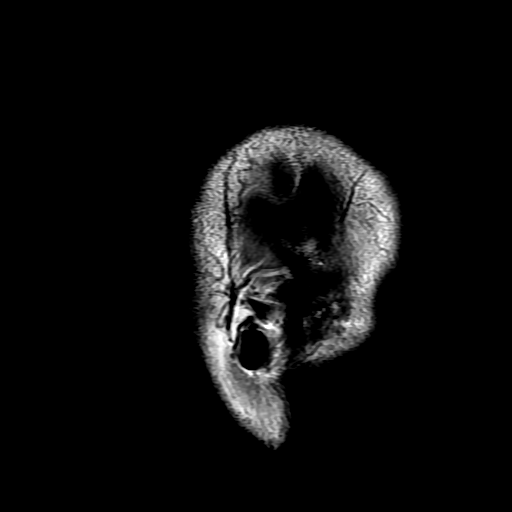

[Series 4: DWI · coronal · 5.0mm · 1.09mm/px · 7 of 70 slices shown (1 of 4)]
[im 1/70]
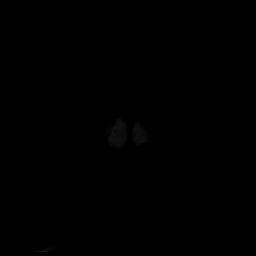
[im 12/70]
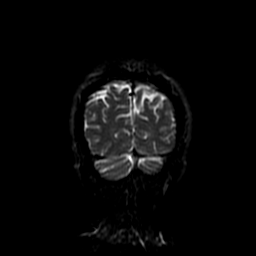
[im 24/70]
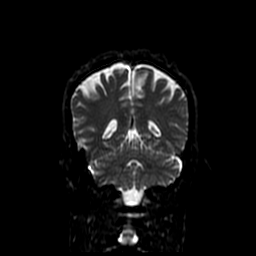
[im 35/70]
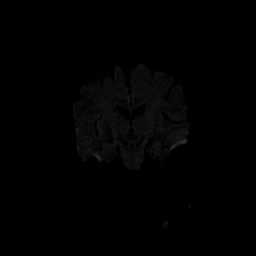
[im 47/70]
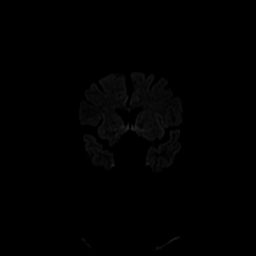
[im 58/70]
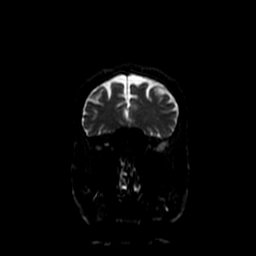
[im 70/70]
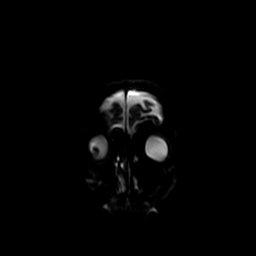

[Series 5: DWI · axial · 3.0mm · 1.09mm/px · z∈[-43,+101]mm · 8 of 98 slices shown (2 of 4)]
[im 1/98]
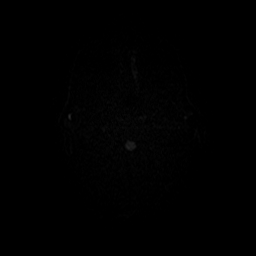
[im 11/98]
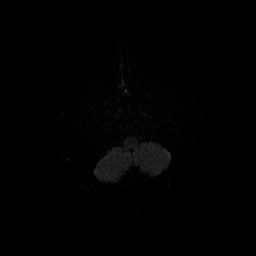
[im 33/98]
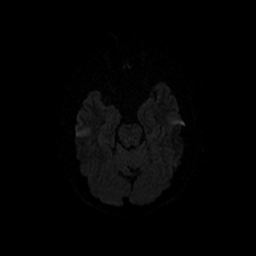
[im 44/98]
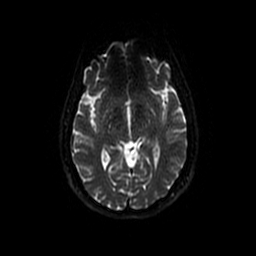
[im 54/98]
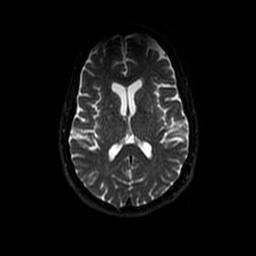
[im 65/98]
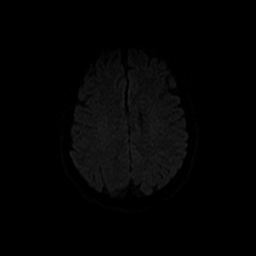
[im 87/98]
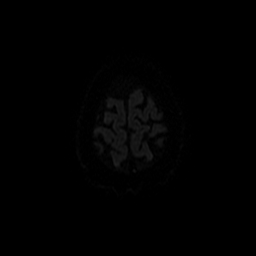
[im 98/98]
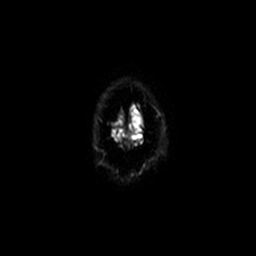

[Series 6: T2 · axial · 5.0mm · 0.47mm/px · z∈[-55,+95]mm · 3 of 26 slices shown (1 of 2)]
[im 1/26]
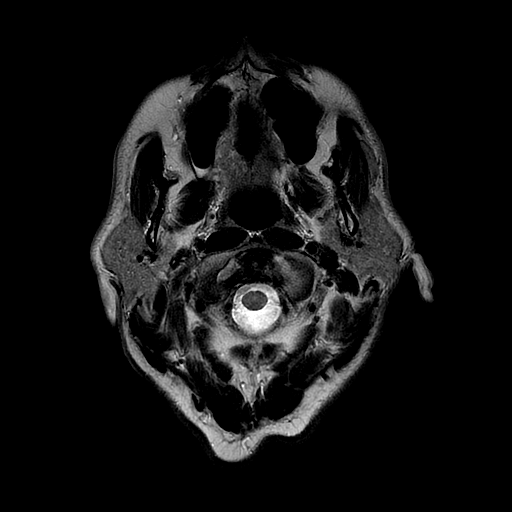
[im 13/26]
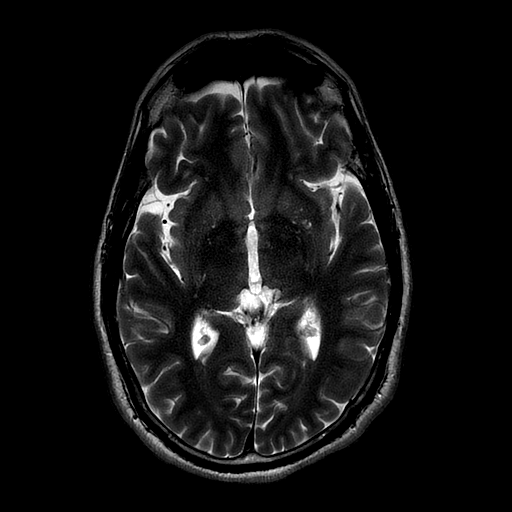
[im 26/26]
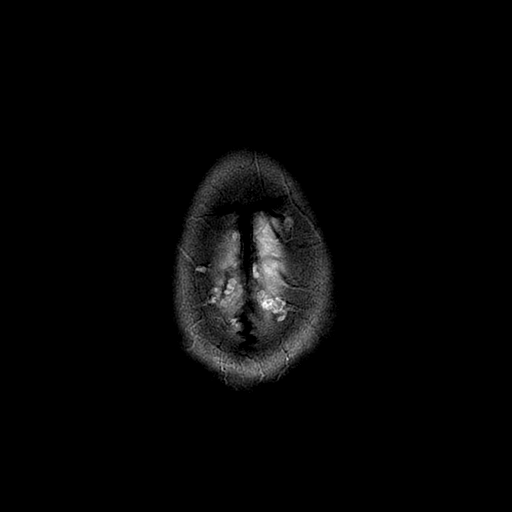

[Series 7: FLAIR · axial · 5.0mm · 0.43mm/px · z∈[-55,+95]mm · 3 of 26 slices shown]
[im 1/26]
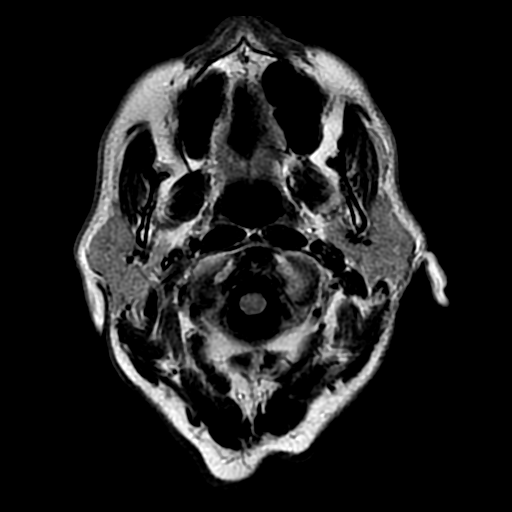
[im 13/26]
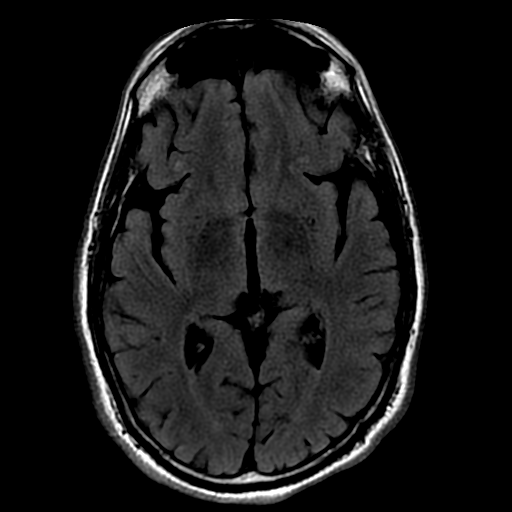
[im 26/26]
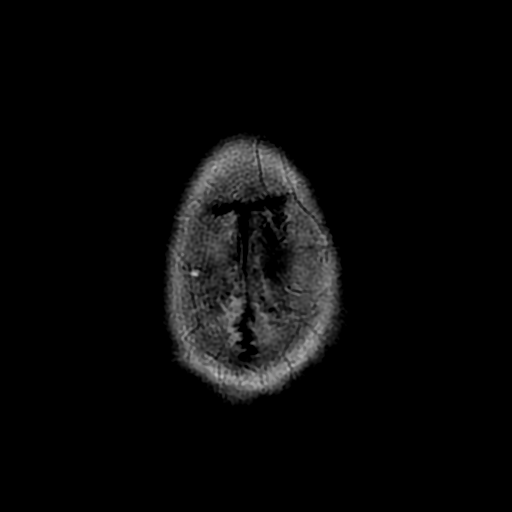

[Series 8: T2 · coronal · 5.0mm · 0.43mm/px · 3 of 30 slices shown (2 of 2)]
[im 1/30]
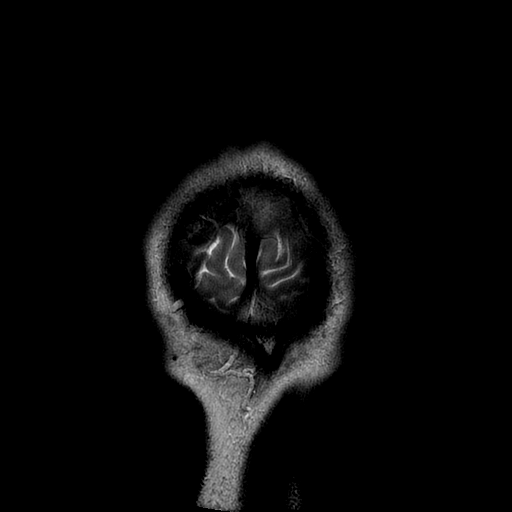
[im 15/30]
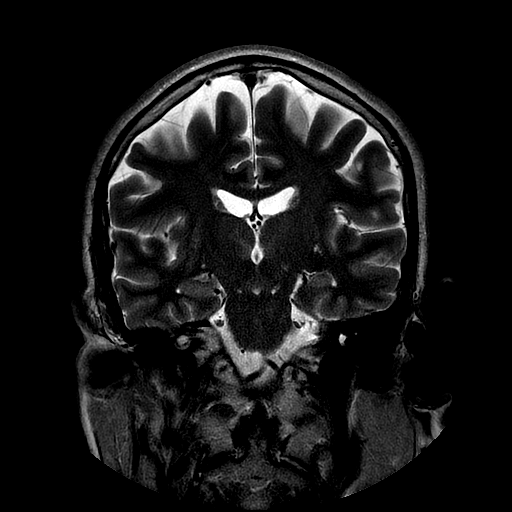
[im 30/30]
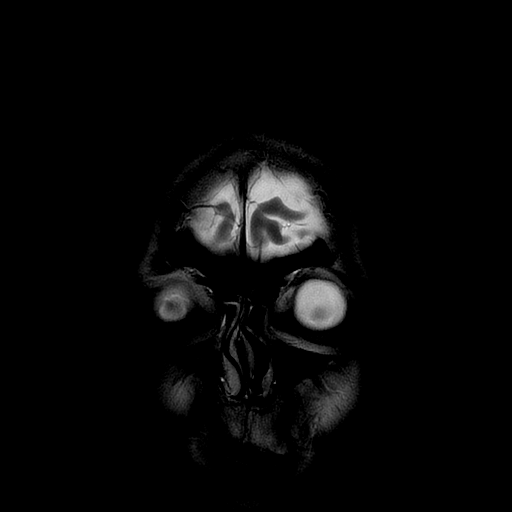

[Series 9: ax mpgr · axial · 5.0mm · 0.47mm/px · 1 of 21 slices shown]
[im 1/21]
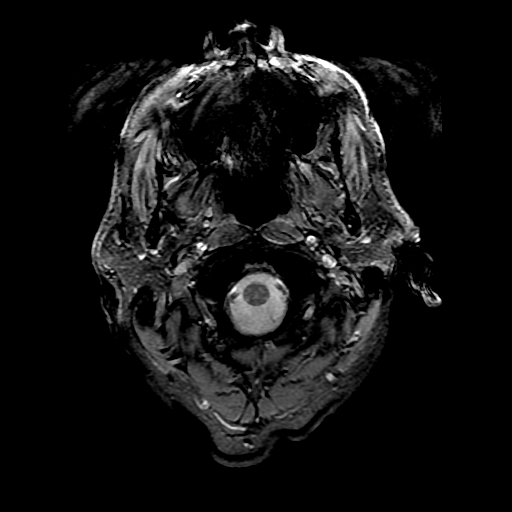

[Series 400: DWI · coronal · 5.0mm · 1.09mm/px · 3 of 35 slices shown (3 of 4)]
[im 1/35]
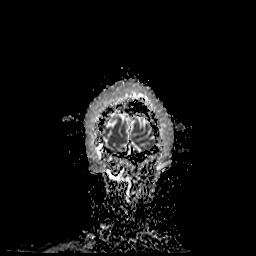
[im 18/35]
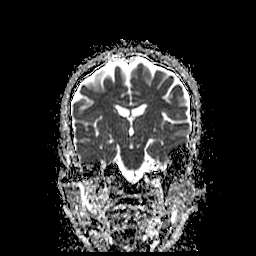
[im 35/35]
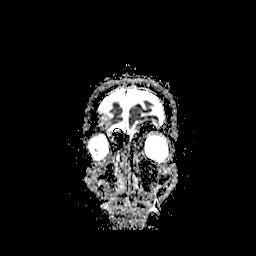

[Series 500: DWI · axial · 3.0mm · 1.09mm/px · z∈[-43,+101]mm · 5 of 49 slices shown (4 of 4)]
[im 1/49]
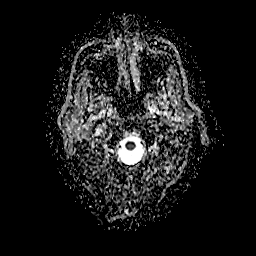
[im 13/49]
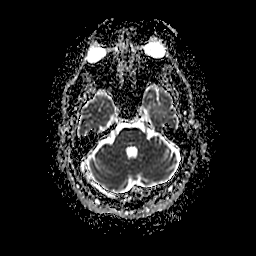
[im 25/49]
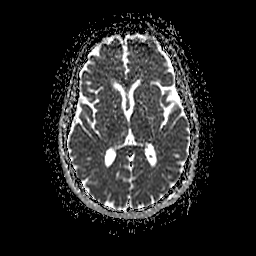
[im 37/49]
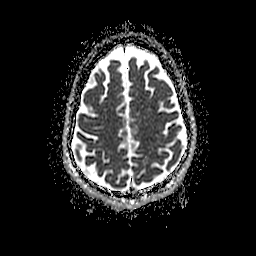
[im 49/49]
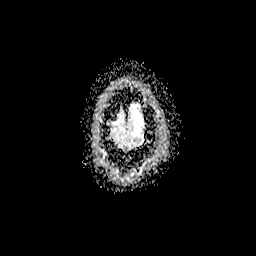

[35 of 48 positions shown; findings below may reference images not displayed]

FINDINGS: Brain: No restricted diffusion to suggest acute infarction. No
midline shift, mass effect, evidence of mass lesion,
ventriculomegaly, extra-axial collection or acute intracranial
hemorrhage. Cervicomedullary junction and pituitary are within
normal limits.

Gray and white matter signal is within normal limits for age
throughout the brain. No cortical encephalomalacia or chronic
cerebral blood products.

Vascular: Major intracranial vascular flow voids are preserved.

Skull and upper cervical spine: Negative. Normal bone marrow signal.

Sinuses/Orbits: Normal orbits soft tissues. Visualized paranasal
sinuses and mastoids are stable and well pneumatized.

Other: Visible internal auditory structures appear normal. Negative
scalp soft tissues.
IMPRESSION: No recent ischemia identified. Normal for age noncontrast MRI
appearance of the brain.

## 2018-09-18 ENCOUNTER — Other Ambulatory Visit: Payer: Self-pay

## 2018-09-18 ENCOUNTER — Emergency Department (HOSPITAL_BASED_OUTPATIENT_CLINIC_OR_DEPARTMENT_OTHER)
Admission: EM | Admit: 2018-09-18 | Discharge: 2018-09-18 | Disposition: A | Discharge status: Home / Self Care | Payer: BC Managed Care – PPO | Attending: Emergency Medicine | Admitting: Emergency Medicine

## 2018-09-18 ENCOUNTER — Emergency Department (HOSPITAL_BASED_OUTPATIENT_CLINIC_OR_DEPARTMENT_OTHER): Payer: BC Managed Care – PPO

## 2018-09-18 ENCOUNTER — Encounter (HOSPITAL_BASED_OUTPATIENT_CLINIC_OR_DEPARTMENT_OTHER): Payer: Self-pay | Admitting: *Deleted

## 2018-09-18 DIAGNOSIS — Z7982 Long term (current) use of aspirin: Secondary | ICD-10-CM | POA: Diagnosis not present

## 2018-09-18 DIAGNOSIS — Z79899 Other long term (current) drug therapy: Secondary | ICD-10-CM | POA: Insufficient documentation

## 2018-09-18 DIAGNOSIS — I10 Essential (primary) hypertension: Secondary | ICD-10-CM | POA: Diagnosis not present

## 2018-09-18 DIAGNOSIS — R1084 Generalized abdominal pain: Secondary | ICD-10-CM | POA: Diagnosis not present

## 2018-09-18 DIAGNOSIS — R109 Unspecified abdominal pain: Secondary | ICD-10-CM | POA: Diagnosis present

## 2018-09-18 DIAGNOSIS — Z87891 Personal history of nicotine dependence: Secondary | ICD-10-CM | POA: Diagnosis not present

## 2018-09-18 DIAGNOSIS — R197 Diarrhea, unspecified: Secondary | ICD-10-CM | POA: Diagnosis not present

## 2018-09-18 DIAGNOSIS — Z7902 Long term (current) use of antithrombotics/antiplatelets: Secondary | ICD-10-CM | POA: Insufficient documentation

## 2018-09-18 LAB — CBC WITH DIFFERENTIAL/PLATELET
Abs Immature Granulocytes: 0.03 10*3/uL (ref 0.00–0.07)
Basophils Absolute: 0 10*3/uL (ref 0.0–0.1)
Basophils Relative: 1 %
Eosinophils Absolute: 0.2 10*3/uL (ref 0.0–0.5)
Eosinophils Relative: 3 %
HCT: 42.8 % (ref 39.0–52.0)
Hemoglobin: 14 g/dL (ref 13.0–17.0)
Immature Granulocytes: 0 %
Lymphocytes Relative: 28 %
Lymphs Abs: 2.2 10*3/uL (ref 0.7–4.0)
MCH: 29.4 pg (ref 26.0–34.0)
MCHC: 32.7 g/dL (ref 30.0–36.0)
MCV: 89.7 fL (ref 80.0–100.0)
Monocytes Absolute: 0.6 10*3/uL (ref 0.1–1.0)
Monocytes Relative: 8 %
Neutro Abs: 4.7 10*3/uL (ref 1.7–7.7)
Neutrophils Relative %: 60 %
Platelets: 241 10*3/uL (ref 150–400)
RBC: 4.77 MIL/uL (ref 4.22–5.81)
RDW: 12.1 % (ref 11.5–15.5)
WBC: 7.8 10*3/uL (ref 4.0–10.5)
nRBC: 0 % (ref 0.0–0.2)

## 2018-09-18 LAB — URINALYSIS, ROUTINE W REFLEX MICROSCOPIC
Bilirubin Urine: NEGATIVE
Glucose, UA: NEGATIVE mg/dL
Hgb urine dipstick: NEGATIVE
Ketones, ur: NEGATIVE mg/dL
Leukocytes, UA: NEGATIVE
Nitrite: NEGATIVE
Protein, ur: NEGATIVE mg/dL
Specific Gravity, Urine: 1.015 (ref 1.005–1.030)
pH: 5.5 (ref 5.0–8.0)

## 2018-09-18 LAB — COMPREHENSIVE METABOLIC PANEL
ALT: 24 U/L (ref 0–44)
AST: 22 U/L (ref 15–41)
Albumin: 4.3 g/dL (ref 3.5–5.0)
Alkaline Phosphatase: 68 U/L (ref 38–126)
Anion gap: 5 (ref 5–15)
BUN: 19 mg/dL (ref 8–23)
CO2: 24 mmol/L (ref 22–32)
Calcium: 9.4 mg/dL (ref 8.9–10.3)
Chloride: 105 mmol/L (ref 98–111)
Creatinine, Ser: 0.91 mg/dL (ref 0.61–1.24)
GFR calc Af Amer: >60 mL/min (ref >60)
GFR calc non Af Amer: >60 mL/min (ref >60)
Glucose, Bld: 96 mg/dL (ref 70–99)
Potassium: 3.8 mmol/L (ref 3.5–5.1)
Sodium: 134 mmol/L — ABNORMAL LOW (ref 135–145)
Total Bilirubin: 0.7 mg/dL (ref 0.3–1.2)
Total Protein: 7.3 g/dL (ref 6.5–8.1)

## 2018-09-18 LAB — LIPASE, BLOOD: Lipase: 24 U/L (ref 11–51)

## 2018-09-18 MED ORDER — FENTANYL CITRATE (PF) 100 MCG/2ML IJ SOLN
50.0000 ug | Freq: Once | INTRAMUSCULAR | Status: AC
  Administered 2018-09-18: 50 ug via INTRAVENOUS
  Filled 2018-09-18: qty 2

## 2018-09-18 MED ORDER — DICYCLOMINE HCL 20 MG PO TABS: 20.0000 mg | ORAL_TABLET | Freq: Two times a day (BID) | ORAL | 0 refills | Status: DC | PRN

## 2018-09-18 MED ORDER — IOPAMIDOL (ISOVUE-300) INJECTION 61%
100.0000 mL | Freq: Once | INTRAVENOUS | Status: AC | PRN
  Administered 2018-09-18: 100 mL via INTRAVENOUS

## 2018-09-18 MED ORDER — SODIUM CHLORIDE 0.9 % IV BOLUS
1000.0000 mL | Freq: Once | INTRAVENOUS | Status: AC
  Administered 2018-09-18: 1000 mL via INTRAVENOUS

## 2018-09-18 MED ORDER — ONDANSETRON HCL 4 MG/2ML IJ SOLN
4.0000 mg | Freq: Once | INTRAMUSCULAR | Status: AC
  Administered 2018-09-18: 4 mg via INTRAVENOUS
  Filled 2018-09-18: qty 2

## 2018-09-18 NOTE — ED Provider Notes (Signed)
[facility] EMERGENCY DEPARTMENT Provider Note   CSN: [id number] Arrival date & time: 09/18/18  1442     History   Chief Complaint Chief Complaint  Patient presents with  . Abdominal Pain    HPI [name] is a 63 y.o. male.  He is presenting the emergency department complaining of diarrhea constipation and low abdominal pain.  This is a symptoms of been going on intermittently since around Thanksgiving.  He said there was an episode of fever over Christmas 202.  He follows with GI in they have told him he probably has irritable bowel.  No blood from above or below.  Said the pain was there Friday and that he has good over the weekend and then recurred again today.  He rates the pain is moderate and sharp  The history is provided by the patient.  Abdominal Pain  Pain location:  LLQ and RLQ Pain quality: sharp   Pain radiates to:  Does not radiate Pain severity:  Moderate Onset quality:  Gradual Timing:  Intermittent Progression:  Waxing and waning Chronicity:  New Context: not trauma   Relieved by:  Nothing Worsened by:  Nothing Ineffective treatments:  None tried Associated symptoms: anorexia, constipation, diarrhea, nausea and vomiting   Associated symptoms: no chest pain, no cough, no dysuria, no fever, no hematemesis, no hematochezia, no hematuria, no shortness of breath and no sore throat     Past Medical History:  Diagnosis Date  . Anxiety   . Basal cell carcinoma (BCC) of forehead   . CAD (coronary artery disease)    a. 10/2015 ant STEMI >> LHC with 3 v CAD; oLAD tx with POBA >> emergent CABG. b. Multiple evals since that time, early graft failure of SVG-RCA by cath 03/2016. c. 2/19 PCI/DES x1 to pRCA, normal EF.  . Carotid artery disease ([place])    a. 40-59% BICA 02/2018.  Marland Kitchen Depression   . Dyspnea   . Ectopic atrial tachycardia ([place])   . Esophageal reflux    eosinophil esophagitis  . Family history of adverse reaction to anesthesia    "sister has  PONV" (06/21/2017)  . Former tobacco use   . Gout   . Hepatitis C    "treated and cured" (06/21/2017)  . High cholesterol   . Hypertension   . Ischemic cardiomyopathy    a. EF 25-30% at intraop TEE 4/17  //  b. Limited Echo 5/17 - EF 45-50%, mild ant HK. c. EF 55-65% by cath 09/2017.  . Migraine    "3-4/yr" (06/21/2017)  . Myocardial infarction ([place]) 10/2015  . Palpitations   . Sinus bradycardia    a. HR dropping into 40s in 02/2016 -> BB reduced.  . Stroke ([place]) 10/2016   "small one; sometimes my memory/cognitive issues" (06/21/2017)  . Symptomatic hypotension    a. 02/2016 ER visit -> meds reduced.  . Syncope     Patient Active Problem List   Diagnosis Date Noted  . Palpitations   . Coronary artery disease of bypass graft of native heart with stable angina pectoris ([place])   . Transient loss of consciousness 03/24/2018  . Ectopic atrial tachycardia ([place]) 02/09/2018  . Central chest pain 03/10/2017  . Family hx-stroke 11/10/2016  . Stroke-like episode ([place]) - R brain, s/p tPA 11/09/2016  . Unstable angina ([place]) 09/07/2016  . Claudication of both lower extremities ([place])   . Pure hypercholesterolemia   . Tobacco abuse disorder   . CAD of autologous artery bypass graft without  angina   . Chest pain at rest 06/10/2016  . Abnormal nuclear stress test - HIGH RISK 04/20/2016  . Old MI (myocardial infarction)   . Essential hypertension 02/26/2016  . Ischemic cardiomyopathy 12/25/2015  . Hyperlipidemia LDL goal <70 12/25/2015  . 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 Coronary artery disease involving native coronary artery of native heart with unstable angina pectoris ([place]) 11/28/2015  . 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 Acute MI anterior wall first episode care [facility])   . Chest pain with high risk for cardiac etiology 03/07/2015  . Dysphagia 03/07/2015  . Gout attack 03/07/2015  . Mixed bipolar I disorder ([place]) 03/07/2015  . Fibromyalgia 07/09/2014  . Gout 07/09/2014  .  Anxiety 07/09/2014  . Depression 07/09/2014  . Hepatitis C 11/20/2012  . Eosinophilic esophagitis 65/99/3570    Past Surgical History:  Procedure Laterality Date  . BASAL CELL CARCINOMA EXCISION     "forehead  . CARDIAC CATHETERIZATION N/A 11/28/2015   Procedure: Left Heart Cath and Coronary Angiography;  Surgeon: [name], MD;  Location: [facility];  Service: Cardiovascular;  Laterality: N/A;  . CARDIAC CATHETERIZATION N/A 11/28/2015   Procedure: Coronary Balloon Angioplasty;  Surgeon: [name], MD;  Location: [facility];  Service: Cardiovascular;  Laterality: N/A;  ostial LAD  . CARDIAC CATHETERIZATION N/A 11/28/2015   Procedure: Coronary/Graft Angiography;  Surgeon: [name], MD;  Location: [place] [facility];  Service: Cardiovascular;  Laterality: N/A;  coronaries only   . CARDIAC CATHETERIZATION N/A 04/21/2016   Procedure: Left Heart Cath and Coronary Angiography;  Surgeon: Wellington Hampshire, MD;  Location: [facility];  Service: Cardiovascular;  Laterality: N/A;  . CARDIAC CATHETERIZATION N/A 06/14/2016   Procedure: Left Heart Cath and Cors/Grafts Angiography;  Surgeon: Lorretta Harp, MD;  Location: [facility];  Service: Cardiovascular;  Laterality: N/A;  . CARDIAC CATHETERIZATION N/A 09/08/2016   Procedure: Left Heart Cath and Cors/Grafts Angiography;  Surgeon: Wellington Hampshire, MD;  Location: [facility];  Service: Cardiovascular;  Laterality: N/A;  . CARDIAC CATHETERIZATION    . CORONARY ARTERY BYPASS GRAFT N/A 11/28/2015   Procedure: CORONARY ARTERY BYPASS GRAFTING (CABG) TIMES FIVE USING LEFT INTERNAL MAMMARY ARTERY AND RIGHT GREATER SAPHENOUS,VIEN HARVEATED BY ENDOVIEN, INTRAOPPRATIVE TEE;  Surgeon: Gaye Pollack, MD;  Location: [facility];  Service: Open Heart Surgery;  Laterality: N/A;  . CORONARY STENT INTERVENTION N/A 10/05/2017   Procedure: CORONARY STENT INTERVENTION;  Surgeon: [name], MD;   Location: [facility];  Service: Cardiovascular;  Laterality: N/A;  . HUMERUS SURGERY Right 1969   "tumor inside bone; filled it w/bone chips"  . LEFT HEART CATH AND CORS/GRAFTS ANGIOGRAPHY N/A 03/11/2017   Procedure: Left Heart Cath and Cors/Grafts Angiography;  Surgeon: Leonie Man, MD;  Location: [facility];  Service: Cardiovascular;  Laterality: N/A;  . LEFT HEART CATH AND CORS/GRAFTS ANGIOGRAPHY N/A 10/05/2017   Procedure: LEFT HEART CATH AND CORS/GRAFTS ANGIOGRAPHY;  Surgeon: [name], MD;  Location: [facility];  Service: Cardiovascular;  Laterality: N/A;  . PERIPHERAL VASCULAR CATHETERIZATION N/A 06/14/2016   Procedure: Lower Extremity Angiography;  Surgeon: Lorretta Harp, MD;  Location: [facility];  Service: Cardiovascular;  Laterality: N/A;        Home Medications    Prior to Admission medications   Medication Sig Start Date End Date Taking? Authorizing Provider  acetaminophen (TYLENOL)  500 MG tablet Take 1,000 mg by mouth every 6 (six) hours as needed for mild pain or headache.     [provider]  amLODipine (NORVASC) 10 MG tablet TAKE 1 TABLET BY MOUTH DAILY 08/07/18   [name], MD  aspirin 81 MG tablet Take 81 mg by mouth daily.    [provider]  atorvastatin (LIPITOR) 80 MG tablet Take 1 tablet (80 mg total) by mouth daily. 08/16/17   [name], MD  chlordiazePOXIDE (LIBRIUM) 10 MG capsule Take 10-20 mg by mouth at bedtime as needed for anxiety.     [provider]  clopidogrel (PLAVIX) 75 MG tablet TAKE 1 TABLET BY MOUTH EVERY DAY Patient taking differently: Take 75 mg by mouth daily.  05/26/18   [name] T, PA-C  fluticasone (FLONASE) 50 MCG/ACT nasal spray Place 1 spray into both nostrils as needed for allergies.  05/26/17   [provider]  lisinopril (PRINIVIL,ZESTRIL) 5 MG tablet Take 5 mg by mouth daily. May take 1 additional tablet once daily for SBP greater  than 150 once hour after taking daily dose Patient taking differently: Take 5 mg by mouth See admin instructions. Take 5 mg by mouth daily. May take 1 additional tablet once daily for SBP greater than 150 once hour after taking daily dose 01/18/18   [name], MD  metoprolol succinate (TOPROL XL) 50 MG 24 hr tablet Take 1 tablet (50 mg total) by mouth as directed. 50 mg in the AM and 25 mg in the PM Patient taking differently: Take 50 mg by mouth daily.  03/08/18   [name] T, PA-C  Multiple Vitamins-Minerals (ONE-A-DAY MENS 50+ ADVANTAGE PO) Take 1 tablet by mouth daily.     [provider]  nitroGLYCERIN (NITROSTAT) 0.4 MG SL tablet PLACE 1 TABLET UNDER THE TONGUE EVERY 5 MINUTES AS NEEDED FOR CHEST PAIN. 3 DOSES MAX 08/08/18   Strader, [place] M, PA-C  pantoprazole (PROTONIX) 40 MG tablet Take 40 mg by mouth daily.    [provider]  ranitidine (ZANTAC) 150 MG tablet Take 150 mg by mouth as needed for heartburn.    [provider]  ranolazine (RANEXA) 1000 MG SR tablet Take 1 tablet (1,000 mg total) by mouth 2 (two) times daily. 02/24/18   [name] T, PA-C  sucralfate (CARAFATE) 1 g tablet Take 1 tablet (1 g total) by mouth 4 (four) times daily -  with meals and at bedtime. 07/13/18 08/12/18  Curatolo, Adam, DO  VIIBRYD 10 MG TABS Take 10 mg by mouth daily. 06/10/18   [provider]  zolpidem (AMBIEN CR) 12.5 MG CR tablet Take 12.5 mg by mouth at bedtime. 06/10/18   [provider]    Family History Family History  Problem Relation Age of Onset  . Lung cancer Mother   . Heart Problems Father   . Heart attack Father 58  . Stroke Father   . Heart failure Father   . Heart attack Maternal Grandmother   . Stroke Maternal Grandmother   . Heart attack Paternal Uncle   . Hypertension Brother   . Autoimmune disease Neg Hx     Social History Social History   Tobacco Use  . Smoking status: Former Smoker    Packs/day: 0.75     Years: 44.00    Pack years: 33.00    Types: Cigarettes    Last attempt to quit: 11/28/2015    Years since quitting: 2.8  . Smokeless  tobacco: Never Used  Substance Use Topics  . Alcohol use: No    Frequency: Never    Comment: 06/21/2017 "I'll have a few drinks q couple months"  . Drug use: No    Comment: 06/21/2017 "nothing since the 1980s"     Allergies   Prednisone; Tetanus toxoids; Wellbutrin [bupropion]; Morphine and related; Scallops [shellfish allergy]; and Chantix [varenicline]   Review of Systems Review of Systems  Constitutional: Negative for fever.  HENT: Negative for sore throat.   Eyes: Negative for visual disturbance.  Respiratory: Negative for cough and shortness of breath.   Cardiovascular: Negative for chest pain.  Gastrointestinal: Positive for abdominal pain, anorexia, constipation, diarrhea, nausea and vomiting. Negative for hematemesis and hematochezia.  Genitourinary: Negative for dysuria and hematuria.  Musculoskeletal: Negative for neck pain.  Skin: Negative for rash.  Neurological: Negative for headaches.     Physical Exam Updated Vital Signs BP 123/87   Pulse 86   Temp 98.3 F (36.8 C) (Oral)   Resp 18   Ht 5\' 9"  (1.753 m)   Wt 85.3 kg   SpO2 100%   BMI 27.76 kg/m   Physical Exam Vitals signs and nursing note reviewed.  Constitutional:      Appearance: He is well-developed.  HENT:     Head: Normocephalic and atraumatic.  Eyes:     Conjunctiva/sclera: Conjunctivae normal.  Neck:     Musculoskeletal: Neck supple.  Cardiovascular:     Rate and Rhythm: Normal rate and regular rhythm.     Heart sounds: No murmur.  Pulmonary:     Effort: Pulmonary effort is normal. No respiratory distress.     Breath sounds: Normal breath sounds.  Abdominal:     Palpations: Abdomen is soft.     Tenderness: There is abdominal tenderness in the right lower quadrant and left lower quadrant. There is no guarding or rebound.  Musculoskeletal: Normal range  of motion.        General: No tenderness or signs of injury.  Skin:    General: Skin is warm and dry.     Capillary Refill: Capillary refill takes less than 2 seconds.  Neurological:     General: No focal deficit present.     Mental Status: He is alert.     Gait: Gait normal.      ED Treatments / Results  Labs (all labs ordered are listed, but only abnormal results are displayed) Labs Reviewed  COMPREHENSIVE METABOLIC PANEL - Abnormal; Notable for the following components:      Result Value   Sodium 134 (*)    All other components within normal limits  URINALYSIS, ROUTINE W REFLEX MICROSCOPIC  CBC WITH DIFFERENTIAL/PLATELET  LIPASE, BLOOD    EKG None  Radiology Ct Abdomen Pelvis W Contrast  Result Date: 09/18/2018 CLINICAL DATA:  RIGHT lower quadrant abdominal pain, nausea, vomiting, diarrhea, and constipation for 2 months, history of diverticulosis, hypertension, hepatitis-C, coronary artery disease post MI and CABG, stroke, former smoker, ischemic cardiomyopathy EXAM: CT ABDOMEN AND PELVIS WITH CONTRAST TECHNIQUE: Multidetector CT imaging of the abdomen and pelvis was performed using the standard protocol following bolus administration of intravenous contrast. Sagittal and coronal MPR images reconstructed from axial data set. CONTRAST:  157mL ISOVUE-300 IOPAMIDOL (ISOVUE-300) INJECTION 61% IV. Dilute oral contrast. COMPARISON:  07/13/2018 FINDINGS: Lower chest: Lung bases clear Hepatobiliary: Tiny hepatic cyst image 10 unchanged. Gallbladder liver otherwise normal appearance Pancreas: Normal appearance Spleen: Normal appearance.  Small splenule inferior to spleen Adrenals/Urinary Tract: Small LEFT  and tiny RIGHT renal cysts. Tiny nonobstructing calculus upper pole RIGHT kidney. No additional renal mass, hydronephrosis, or hydroureter. Bladder unremarkable. Stomach/Bowel: Normal appendix. Stomach normal appearance. Bowel loops unremarkable. Vascular/Lymphatic: Atherosclerotic  calcifications aorta without aneurysm. Vascular structures patent grossly patent. No adenopathy. Reproductive: Unremarkable seminal vesicles. Minimal prostatic enlargement, gland 4.2 x 3.2 x 4.2 cm (volume = 30 cm^3). Other: No free air free fluid. Tiny umbilical hernia containing fat. No acute inflammatory process. Musculoskeletal: No acute osseous findings. IMPRESSION: Renal cysts and tiny nonobstructing RIGHT renal calculus. Minimal prostatic enlargement. No acute intra-abdominal or intrapelvic abnormalities. Aortic Atherosclerosis (ICD10-I70.0). Electronically Signed   By: Lavonia Dana M.D.   On: 09/18/2018 17:32    Procedures Procedures (including critical care time)  Medications Ordered in ED Medications  sodium chloride 0.9 % bolus 1,000 mL (0 mLs Intravenous Stopped 09/18/18 1648)  ondansetron (ZOFRAN) injection 4 mg (4 mg Intravenous Given 09/18/18 1527)  fentaNYL (SUBLIMAZE) injection 50 mcg (50 mcg Intravenous Given 09/18/18 1527)  iopamidol (ISOVUE-300) 61 % injection 100 mL (100 mLs Intravenous Contrast Given 09/18/18 1707)     Initial Impression / Assessment and Plan / ED Course  I have reviewed the triage vital signs and the nursing notes.  Pertinent labs & imaging results that were available during my care of the patient were reviewed by me and considered in my medical decision making (see chart for details).  Clinical Course as of Sep 19 2339  Mon Sep 18, 2018  1744 Patient's work-up has been unremarkable including a CT.  I reviewed with him that if this is IBS this is to be expected.  He understands he needs to follow-up with his GI doctor.  We will trial him on some Bentyl to see if that helps with his symptoms.   [MB]    Clinical Course User Index [MB] Hayden Rasmussen, MD     Final Clinical Impressions(s) / ED Diagnoses   Final diagnoses:  Generalized abdominal pain    ED Discharge Orders         Ordered    dicyclomine (BENTYL) 20 MG tablet  2 times daily PRN       09/18/18 1746           Hayden Rasmussen, MD 09/18/18 2342

## 2018-09-18 NOTE — ED Triage Notes (Signed)
Pt c/o n/v/d and constipation x 2 months , being seen by GI for same

## 2018-09-18 NOTE — ED Notes (Signed)
Pt appears comfortable, requesting more pain medication. MD informed. No emesis

## 2018-09-18 NOTE — Discharge Instructions (Addendum)
Your evaluated in the emergency department for intermittent abdominal pain with associated diarrhea and constipation.  Your lab work and CAT scan were unremarkable.  We will try you on some medication for spasm.  Will be important for you to schedule a close follow-up with your GI specialist.

## 2018-09-21 ENCOUNTER — Other Ambulatory Visit: Payer: Self-pay | Admitting: Interventional Cardiology

## 2018-09-27 ENCOUNTER — Telehealth: Payer: Self-pay | Admitting: Interventional Cardiology

## 2018-09-27 NOTE — Telephone Encounter (Signed)
Returned call to patient earlier. He states that he woke up this morning with pain in his left shoulder. He states that he took 600 mg of ibuprofen and then later had some nausea. He denies having any chest pain, SOB, vomiting, diaphoresis, or any other Sx. He states that his shoulder hurts more when he moves it and is not sure if he slept on it wrong. He states that his BP was 133/83 and HR 80. Patient denies any discomfort at this time. Instructed for the patient to continue to monitor and let us know if his Sx change or worsen. Instructed for patient to follow up with PCP for other causes.

## 2018-09-27 NOTE — Telephone Encounter (Signed)
Patient called complaining of upper left shoulder and arm pain. He states he is feeling nauseous because of the pain. He says the pain is intense.  He stated he took 600mg  of ibuprofen and it has relieved some of the pain.   He stated he hasn't called his PCP.  He said he is not having any chest pain or SOB. He said his BP 138/82, HR 80, he is not sweating. He is wondering if this has anything to do with he heart.

## 2018-09-28 ENCOUNTER — Emergency Department (HOSPITAL_COMMUNITY): Payer: BC Managed Care – PPO

## 2018-09-28 ENCOUNTER — Emergency Department (HOSPITAL_COMMUNITY)
Admission: EM | Admit: 2018-09-28 | Discharge: 2018-09-28 | Disposition: A | Discharge status: Home / Self Care | Payer: BC Managed Care – PPO | Attending: Emergency Medicine | Admitting: Emergency Medicine

## 2018-09-28 DIAGNOSIS — I10 Essential (primary) hypertension: Secondary | ICD-10-CM | POA: Insufficient documentation

## 2018-09-28 DIAGNOSIS — M5412 Radiculopathy, cervical region: Secondary | ICD-10-CM | POA: Insufficient documentation

## 2018-09-28 DIAGNOSIS — Z87891 Personal history of nicotine dependence: Secondary | ICD-10-CM | POA: Insufficient documentation

## 2018-09-28 DIAGNOSIS — E7800 Pure hypercholesterolemia, unspecified: Secondary | ICD-10-CM

## 2018-09-28 DIAGNOSIS — Z79899 Other long term (current) drug therapy: Secondary | ICD-10-CM | POA: Insufficient documentation

## 2018-09-28 DIAGNOSIS — I2583 Coronary atherosclerosis due to lipid rich plaque: Secondary | ICD-10-CM

## 2018-09-28 DIAGNOSIS — R079 Chest pain, unspecified: Secondary | ICD-10-CM

## 2018-09-28 DIAGNOSIS — I2510 Atherosclerotic heart disease of native coronary artery without angina pectoris: Secondary | ICD-10-CM

## 2018-09-28 LAB — CBC
HCT: 39.9 % (ref 39.0–52.0)
Hemoglobin: 12.6 g/dL — ABNORMAL LOW (ref 13.0–17.0)
MCH: 29.4 pg (ref 26.0–34.0)
MCHC: 31.6 g/dL (ref 30.0–36.0)
MCV: 93 fL (ref 80.0–100.0)
Platelets: 202 10*3/uL (ref 150–400)
RBC: 4.29 MIL/uL (ref 4.22–5.81)
RDW: 12.8 % (ref 11.5–15.5)
WBC: 7.9 10*3/uL (ref 4.0–10.5)
nRBC: 0 % (ref 0.0–0.2)

## 2018-09-28 LAB — BASIC METABOLIC PANEL
Anion gap: 8 (ref 5–15)
BUN: 20 mg/dL (ref 8–23)
CO2: 26 mmol/L (ref 22–32)
Calcium: 9 mg/dL (ref 8.9–10.3)
Chloride: 106 mmol/L (ref 98–111)
Creatinine, Ser: 1.13 mg/dL (ref 0.61–1.24)
GFR calc Af Amer: >60 mL/min (ref >60)
GFR calc non Af Amer: >60 mL/min (ref >60)
Glucose, Bld: 99 mg/dL (ref 70–99)
Potassium: 4.2 mmol/L (ref 3.5–5.1)
Sodium: 140 mmol/L (ref 135–145)

## 2018-09-28 LAB — I-STAT TROPONIN, ED
Troponin i, poc: 0 ng/mL (ref 0.00–0.08)
Troponin i, poc: 0 ng/mL (ref 0.00–0.08)
Troponin i, poc: 0 ng/mL (ref 0.00–0.08)

## 2018-09-28 LAB — ECHOCARDIOGRAM COMPLETE
Height: 69 in
Weight: 3040 oz

## 2018-09-28 MED ORDER — SODIUM CHLORIDE 0.9% FLUSH
3.0000 mL | Freq: Once | INTRAVENOUS | Status: AC
  Administered 2018-09-28: 3 mL via INTRAVENOUS

## 2018-09-28 MED ORDER — OXYCODONE-ACETAMINOPHEN 5-325 MG PO TABS: 1.0000 | ORAL_TABLET | Freq: Four times a day (QID) | ORAL | 0 refills | Status: DC | PRN

## 2018-09-28 MED ORDER — ONDANSETRON HCL 4 MG/2ML IJ SOLN
4.0000 mg | Freq: Once | INTRAMUSCULAR | Status: AC
  Administered 2018-09-28: 4 mg via INTRAVENOUS
  Filled 2018-09-28: qty 2

## 2018-09-28 MED ORDER — IOPAMIDOL (ISOVUE-370) INJECTION 76%
100.0000 mL | Freq: Once | INTRAVENOUS | Status: AC | PRN
  Administered 2018-09-28: 100 mL via INTRAVENOUS

## 2018-09-28 MED ORDER — IOPAMIDOL (ISOVUE-370) INJECTION 76%
INTRAVENOUS | Status: AC
  Filled 2018-09-28: qty 100

## 2018-09-28 MED ORDER — METHOCARBAMOL 500 MG PO TABS: 1000.0000 mg | ORAL_TABLET | Freq: Four times a day (QID) | ORAL | 0 refills | Status: DC

## 2018-09-28 MED ORDER — HYDROMORPHONE HCL 1 MG/ML IJ SOLN
0.5000 mg | Freq: Once | INTRAMUSCULAR | Status: AC
  Administered 2018-09-28: 0.5 mg via INTRAVENOUS
  Filled 2018-09-28: qty 1

## 2018-09-28 MED ORDER — OXYCODONE-ACETAMINOPHEN 5-325 MG PO TABS
1.0000 | ORAL_TABLET | Freq: Once | ORAL | Status: AC
  Administered 2018-09-28: 1 via ORAL
  Filled 2018-09-28: qty 1

## 2018-09-28 NOTE — ED Provider Notes (Signed)
9:07 AM handoff from [place] PA-C at shift change --patient with history of CABG, stenting --presents with complaint of left neck, arm, scapular pain with radiation to the left chest.  Patient has had 2- troponins.  He has been seen by cardiology.  I personally talked with Dr. Radford Pax who has seen the patient.  At this point, she has low concern that this is his heart.  She is concerned that patient's pain may be coming from the cervical or thoracic spine given that his pain starts in his neck and back area and radiates to the chest.  Requests that we further evaluate this possibility.  I have seen the patient.  He reports pain that radiates down from his neck into his left arm as well as scapular pain.  He has pain in both cervical and thoracic nerve distributions.  MR ordered.  Discussed with Dr. Ronnald Nian.  BP 104/68   Pulse 63   Temp 98.3 F (36.8 C) (Oral)   Resp 16   Ht 5\' 9"  (1.753 m)   Wt 86.2 kg   SpO2 95%   BMI 28.06 kg/m    Patient updated are all results.  Echocardiography with normal findings.  Troponin negative x3.  CT Angio of the chest shows mildly dilated ascending aorta, otherwise no dissection or other problems.  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  This may be because of the patient's pain today.  Patient will be discharged home with pain medication and Robaxin.  He is unable to tolerate prednisone and is unable to take NSAIDs due to his previous cardiac history.  Discussed need for follow-up with neurosurgery for further recommendations and treatment.  Referral given.  Patient counseled on use of narcotic pain medications. Counseled not to combine these medications with others containing tylenol. Urged not to drink alcohol, drive, or perform any other activities that requires focus while taking these medications. The patient verbalizes understanding and agrees with the plan.    Carlisle Cater,  PA-C 09/28/18 [place], [place], DO 09/28/18 1723

## 2018-09-28 NOTE — ED Provider Notes (Signed)
[facility] EMERGENCY DEPARTMENT Provider Note   CSN: [id number] Arrival date & time: 09/28/18  0325     History   Chief Complaint Chief Complaint  Patient presents with  . Chest Pain    HPI Graydon Fofana is a 63 y.o. male with a h/o of CAD s/p anterior myocardial infarction in March 2017 treated with emergent CABG, systolic heart failure secondary to ischemic cardiomyopathy, prior right CVA treated with TPA March 2018, hepatitis C status post treatment, esophageal reflux, hypertension, and hypercholesterolemia who presents to the emergency department with a chief complaint of chest pain.  The patient reports that he awoke from sleep at 2 AM with severe, nonradiating central chest pain.  Pain has been constant since onset and accompanied by nausea.  He states " it feels like when I had my heart attack previously."  He reports that he checked his blood pressure at home received a reading of 80/50.  He took 2 of nitroglycerin and 324 ASA with no improvement in his symptoms so called EMS.  He reports he was given 100 mcg of fentanyl which improved his pain to 3 out of 10; however, he reports the relief was short-lived and the pain is currently severe.  Reports prior to the onset of chest pain that he awoke yesterday morning with intermittent severe, sharp pain in his shoulder blade that radiates down his left arm.  He states the pain feels "deep".  Reports that he was seen in urgent care and was discharged with muscle relaxers and Tylenol without relief.  Reports the shoulder blade pain is worse with sitting for long periods of time.  He states that he had to be work early due to the severity of the pain.  He denies recent falls, injuries, or new exercises.  He states he is also been having some pain to the middle of the posterior neck since the shoulder blade pain is worsened.  He reports that he was also supposed to follow-up with urology yesterday after he was seen in the ER  on January 20 and diagnosed with kidney stones, but canceled the appointment due to his acute symptoms.  He denies diaphoresis, vomiting, lightheadedness, dizziness, pallor, shortness of breath, palpitations, lower extremity swelling, abdominal pain, diarrhea, numbness, weakness.  The history is provided by the patient. No language interpreter was used.    Past Medical History:  Diagnosis Date  . Anxiety   . Basal cell carcinoma (BCC) of forehead   . CAD (coronary artery disease)    a. 10/2015 ant STEMI >> LHC with 3 v CAD; oLAD tx with POBA >> emergent CABG. b. Multiple evals since that time, early graft failure of SVG-RCA by cath 03/2016. c. 2/19 PCI/DES x1 to pRCA, normal EF.  . Carotid artery disease ([place])    a. 40-59% BICA 02/2018.  Marland Kitchen Depression   . Dyspnea   . Ectopic atrial tachycardia ([place])   . Esophageal reflux    eosinophil esophagitis  . Family history of adverse reaction to anesthesia    "sister has PONV" (06/21/2017)  . Former tobacco use   . Gout   . Hepatitis C    "treated and cured" (06/21/2017)  . High cholesterol   . Hypertension   . Ischemic cardiomyopathy    a. EF 25-30% at intraop TEE 4/17  //  b. Limited Echo 5/17 - EF 45-50%, mild ant HK. c. EF 55-65% by cath 09/2017.  . Migraine    "3-4/yr" (06/21/2017)  . Myocardial  infarction ([place]) 10/2015  . Palpitations   . Sinus bradycardia    a. HR dropping into 40s in 02/2016 -> BB reduced.  . Stroke ([place]) 10/2016   "small one; sometimes my memory/cognitive issues" (06/21/2017)  . Symptomatic hypotension    a. 02/2016 ER visit -> meds reduced.  . Syncope     Patient Active Problem List   Diagnosis Date Noted  . Palpitations   . Coronary artery disease of bypass graft of native heart with stable angina pectoris ([place])   . Transient loss of consciousness 03/24/2018  . Ectopic atrial tachycardia ([place]) 02/09/2018  . Central chest pain 03/10/2017  . Family hx-stroke 11/10/2016  . Stroke-like episode ([place]) - R  brain, s/p tPA 11/09/2016  . Unstable angina ([place]) 09/07/2016  . Claudication of both lower extremities ([place])   . Pure hypercholesterolemia   . Tobacco abuse disorder   . CAD of autologous artery bypass graft without angina   . Chest pain at rest 06/10/2016  . Abnormal nuclear stress test - HIGH RISK 04/20/2016  . Old MI (myocardial infarction)   . Essential hypertension 02/26/2016  . Ischemic cardiomyopathy 12/25/2015  . Hyperlipidemia LDL goal <70 12/25/2015  . 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 Coronary artery disease involving native coronary artery of native heart with unstable angina pectoris ([place]) 11/28/2015  . 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 Acute MI anterior wall first episode care [facility])   . Chest pain with high risk for cardiac etiology 03/07/2015  . Dysphagia 03/07/2015  . Gout attack 03/07/2015  . Mixed bipolar I disorder ([place]) 03/07/2015  . Fibromyalgia 07/09/2014  . Gout 07/09/2014  . Anxiety 07/09/2014  . Depression 07/09/2014  . Hepatitis C 11/20/2012  . Eosinophilic esophagitis 16/05/9603    Past Surgical History:  Procedure Laterality Date  . BASAL CELL CARCINOMA EXCISION     "forehead  . CARDIAC CATHETERIZATION N/A 11/28/2015   Procedure: Left Heart Cath and Coronary Angiography;  Surgeon: [name], MD;  Location: [facility] CV LAB;  Service: Cardiovascular;  Laterality: N/A;  . CARDIAC CATHETERIZATION N/A 11/28/2015   Procedure: Coronary Balloon Angioplasty;  Surgeon: [name], MD;  Location: [facility] CV LAB;  Service: Cardiovascular;  Laterality: N/A;  ostial LAD  . CARDIAC CATHETERIZATION N/A 11/28/2015   Procedure: Coronary/Graft Angiography;  Surgeon: [name], MD;  Location: [facility] CV LAB;  Service: Cardiovascular;  Laterality: N/A;  coronaries only   . CARDIAC CATHETERIZATION N/A 04/21/2016   Procedure: Left Heart Cath and Coronary Angiography;  Surgeon: Wellington Hampshire, MD;  Location: [facility] CV  LAB;  Service: Cardiovascular;  Laterality: N/A;  . CARDIAC CATHETERIZATION N/A 06/14/2016   Procedure: Left Heart Cath and Cors/Grafts Angiography;  Surgeon: Lorretta Harp, MD;  Location: [place] CV LAB;  Service: Cardiovascular;  Laterality: N/A;  . CARDIAC CATHETERIZATION N/A 09/08/2016   Procedure: Left Heart Cath and Cors/Grafts Angiography;  Surgeon: Wellington Hampshire, MD;  Location: [facility] CV LAB;  Service: Cardiovascular;  Laterality: N/A;  . CARDIAC CATHETERIZATION    . CORONARY ARTERY BYPASS GRAFT N/A 11/28/2015   Procedure: CORONARY ARTERY BYPASS GRAFTING (CABG) TIMES FIVE USING LEFT INTERNAL MAMMARY ARTERY AND RIGHT GREATER SAPHENOUS,VIEN HARVEATED BY ENDOVIEN, INTRAOPPRATIVE TEE;  Surgeon: Gaye Pollack, MD;  Location: [facility];  Service: Open Heart Surgery;  Laterality: N/A;  . CORONARY STENT INTERVENTION N/A 10/05/2017   Procedure: CORONARY STENT INTERVENTION;  Surgeon: [name], MD;  Location:  [facility] INVASIVE CV LAB;  Service: Cardiovascular;  Laterality: N/A;  . HUMERUS SURGERY Right 1969   "tumor inside bone; filled it w/bone chips"  . LEFT HEART CATH AND CORS/GRAFTS ANGIOGRAPHY N/A 03/11/2017   Procedure: Left Heart Cath and Cors/Grafts Angiography;  Surgeon: Leonie Man, MD;  Location: [facility] CV LAB;  Service: Cardiovascular;  Laterality: N/A;  . LEFT HEART CATH AND CORS/GRAFTS ANGIOGRAPHY N/A 10/05/2017   Procedure: LEFT HEART CATH AND CORS/GRAFTS ANGIOGRAPHY;  Surgeon: [name], MD;  Location: [facility] CV LAB;  Service: Cardiovascular;  Laterality: N/A;  . PERIPHERAL VASCULAR CATHETERIZATION N/A 06/14/2016   Procedure: Lower Extremity Angiography;  Surgeon: Lorretta Harp, MD;  Location: [facility] CV LAB;  Service: Cardiovascular;  Laterality: N/A;        Home Medications    Prior to Admission medications   Medication Sig Start Date End Date Taking? Authorizing Provider  acetaminophen (TYLENOL) 500 MG tablet Take 1,000 mg by  mouth every 6 (six) hours as needed for mild pain or headache.    Yes [provider]  amLODipine (NORVASC) 10 MG tablet TAKE 1 TABLET BY MOUTH DAILY Patient taking differently: Take 10 mg by mouth daily.  08/07/18  Yes [name], MD  aspirin 81 MG tablet Take 81 mg by mouth daily.   Yes [provider]  atorvastatin (LIPITOR) 80 MG tablet TAKE 1 TABLET(80 MG) BY MOUTH DAILY Patient taking differently: Take 80 mg by mouth daily.  09/21/18  Yes [name], MD  chlordiazePOXIDE (LIBRIUM) 10 MG capsule Take 10-20 mg by mouth at bedtime as needed for anxiety.    Yes [provider]  clopidogrel (PLAVIX) 75 MG tablet TAKE 1 TABLET BY MOUTH EVERY DAY Patient taking differently: Take 75 mg by mouth daily.  05/26/18  Yes Weaver, Scott T, PA-C  cyclobenzaprine (FLEXERIL) 5 MG tablet Take 5 mg by mouth 3 (three) times daily as needed for muscle spasms.   Yes [provider]  dicyclomine (BENTYL) 20 MG tablet Take 1 tablet (20 mg total) by mouth 2 (two) times daily as needed for spasms. 09/18/18  Yes Hayden Rasmussen, MD  fluticasone [facility]) 50 MCG/ACT nasal spray Place 1 spray into both nostrils as needed for allergies.  05/26/17  Yes [provider]  lisinopril (PRINIVIL,ZESTRIL) 5 MG tablet Take 5 mg by mouth daily. May take 1 additional tablet once daily for SBP greater than 150 once hour after taking daily dose Patient taking differently: Take 5 mg by mouth See admin instructions. Take 5 mg by mouth daily. May take 1 additional tablet once daily for SBP greater than 150 once hour after taking daily dose 01/18/18  Yes [name], MD  metoprolol succinate (TOPROL XL) 50 MG 24 hr tablet Take 1 tablet (50 mg total) by mouth as directed. 50 mg in the AM and 25 mg in the PM Patient taking differently: Take 50 mg by mouth daily.  03/08/18  Yes Weaver, Scott T, PA-C  Multiple Vitamins-Minerals (ONE-A-DAY MENS 50+ ADVANTAGE PO) Take 1 tablet by  mouth daily.    Yes [provider]  nitroGLYCERIN (NITROSTAT) 0.4 MG SL tablet PLACE 1 TABLET UNDER THE TONGUE EVERY 5 MINUTES AS NEEDED FOR CHEST PAIN. 3 DOSES MAX Patient taking differently: Place 0.4 mg under the tongue every 5 (five) minutes as needed for chest pain.  08/08/18  Yes Strader, Odell, PA-C  pantoprazole (PROTONIX) 40 MG tablet Take 40 mg by mouth daily.  Yes [provider]  ranitidine (ZANTAC) 150 MG tablet Take 150 mg by mouth as needed for heartburn.   Yes [provider]  ranolazine (RANEXA) 1000 MG SR tablet Take 1 tablet (1,000 mg total) by mouth 2 (two) times daily. Patient taking differently: Take 1,000 mg by mouth daily.  02/24/18  Yes Weaver, Scott T, PA-C  VIIBRYD 10 MG TABS Take 10 mg by mouth daily. 06/10/18  Yes [provider]  zolpidem (AMBIEN CR) 12.5 MG CR tablet Take 12.5 mg by mouth at bedtime as needed for sleep.  06/10/18  Yes [provider]  sucralfate (CARAFATE) 1 g tablet Take 1 tablet (1 g total) by mouth 4 (four) times daily -  with meals and at bedtime. Patient not taking: Reported on 09/28/2018 07/13/18 08/12/18  Lennice Sites, DO    Family History Family History  Problem Relation Age of Onset  . Lung cancer Mother   . Heart Problems Father   . Heart attack Father 87  . Stroke Father   . Heart failure Father   . Heart attack Maternal Grandmother   . Stroke Maternal Grandmother   . Heart attack Paternal Uncle   . Hypertension Brother   . Autoimmune disease Neg Hx     Social History Social History   Tobacco Use  . Smoking status: Former Smoker    Packs/day: 0.75    Years: 44.00    Pack years: 33.00    Types: Cigarettes    Last attempt to quit: 11/28/2015    Years since quitting: 2.8  . Smokeless tobacco: Never Used  Substance Use Topics  . Alcohol use: No    Frequency: Never    Comment: 06/21/2017 "I'll have a few drinks q couple months"  . Drug use: No    Comment: 06/21/2017  "nothing since the 1980s"     Allergies   Prednisone; Tetanus toxoids; Wellbutrin [bupropion]; Morphine and related; Scallops [shellfish allergy]; and Chantix [varenicline]   Review of Systems Review of Systems  Constitutional: Negative for appetite change and fever.  Respiratory: Negative for shortness of breath.   Cardiovascular: Positive for chest pain. Negative for palpitations and leg swelling.  Gastrointestinal: Positive for nausea. Negative for abdominal pain, blood in stool, constipation, diarrhea and vomiting.  Genitourinary: Negative for dysuria.  Musculoskeletal: Positive for neck pain. Negative for back pain.  Skin: Negative for rash.  Allergic/Immunologic: Negative for immunocompromised state.  Neurological: Negative for weakness, numbness and headaches.  Psychiatric/Behavioral: Negative for confusion.   Physical Exam Updated Vital Signs BP 104/68   Pulse 63   Temp 98.3 F (36.8 C) (Oral)   Resp 16   Ht 5\' 9"  (1.753 m)   Wt 86.2 kg   SpO2 95%   BMI 28.06 kg/m   Physical Exam Vitals signs and nursing note reviewed.  Constitutional:      Appearance: He is well-developed.  HENT:     Head: Normocephalic.  Eyes:     Conjunctiva/sclera: Conjunctivae normal.  Neck:     Musculoskeletal: Neck supple.  Cardiovascular:     Rate and Rhythm: Normal rate and regular rhythm.     Pulses:          Radial pulses are 2+ on the right side and 2+ on the left side.       Dorsalis pedis pulses are 2+ on the right side and 2+ on the left side.     Heart sounds: Heart sounds not distant. No murmur. No S3 or S4  sounds.   Pulmonary:     Effort: Pulmonary effort is normal. No tachypnea, accessory muscle usage or respiratory distress.     Breath sounds: No stridor. No wheezing, rhonchi or rales.  Chest:     Chest wall: No tenderness or crepitus.  Abdominal:     General: There is no distension.     Palpations: Abdomen is soft.  Musculoskeletal:     Right lower leg: He  exhibits no tenderness. No edema.     Left lower leg: He exhibits no tenderness. No edema.     Comments: Tender to palpation over C5-C7 spinous processes without crepitus or step-offs.  No paraspinal muscle tenderness bilaterally.  No tenderness to the thoracic or lumbar spinous processes or bilateral paraspinal muscles.  No tenderness of bilateral clavicles.  No reproducible tenderness to the left scapula, shoulder, upper arm, elbow, or forearm.  There is some moderate tenderness to palpation to the left trapezius, but no obvious muscle spasms.  Skin:    General: Skin is warm and dry.  Neurological:     Mental Status: He is alert.  Psychiatric:        Behavior: Behavior normal.      ED Treatments / Results  Labs (all labs ordered are listed, but only abnormal results are displayed) Labs Reviewed  CBC - Abnormal; Notable for the following components:      Result Value   Hemoglobin 12.6 (*)    All other components within normal limits  BASIC METABOLIC PANEL  I-STAT TROPONIN, ED  I-STAT TROPONIN, ED    EKG EKG Interpretation  Date/Time:  Thursday September 28 2018 03:33:49 EST Ventricular Rate:  76 PR Interval:  144 QRS Duration: 86 QT Interval:  356 QTC Calculation: 400 R Axis:   76 Text Interpretation:  Normal sinus rhythm with sinus arrhythmia Low voltage QRS Borderline ECG Interpretation limited secondary to artifact Confirmed by Ripley Fraise [phone]) on 09/28/2018 4:31:12 AM   Radiology Dg Chest 2 View  Result Date: 09/28/2018 CLINICAL DATA:  Chest pain EXAM: CHEST - 2 VIEW COMPARISON:  07/13/2018 FINDINGS: Normal heart size and mediastinal contours. CABG. No acute infiltrate or edema. No effusion or pneumothorax. Spondylosis. No acute osseous findings. Spondylosis. Artifact from EKG leads. IMPRESSION: No evidence of active disease. Electronically Signed   By: Monte Fantasia M.D.   On: 09/28/2018 04:22    Procedures Procedures (including critical care  time)  Medications Ordered in ED Medications  sodium chloride flush (NS) 0.9 % injection 3 mL (3 mLs Intravenous Given 09/28/18 0530)  HYDROmorphone (DILAUDID) injection 0.5 mg (0.5 mg Intravenous Given 09/28/18 0527)  ondansetron (ZOFRAN) injection 4 mg (4 mg Intravenous Given 09/28/18 0527)     Initial Impression / Assessment and Plan / ED Course  I have reviewed the triage vital signs and the nursing notes.  Pertinent labs & imaging results that were available during my care of the patient were reviewed by me and considered in my medical decision making (see chart for details).     63 year old male with a h/o of CAD s/p anterior myocardial infarction in March 2017 treated with emergent CABG, systolic heart failure secondary to ischemic cardiomyopathy, prior right CVA treated with TPA March 2018, hepatitis C status post treatment, esophageal reflux, hypertension, and hypercholesterolemia presenting with left shoulder, arm, and neck pain that is been intermittent since yesterday morning with central chest pain that awoke him from sleep last night.  States that his blood pressure was 80/50 when he checked it  at home after he awoke from the chest pain.  Differential diagnosis includes atypical chest pain, cervical radiculopathy, or musculoskeletal pain.   EKG with sinus arrhythmia normal sinus rhythm.  Hemoglobin is 12.6.  Metabolic panel is unremarkable.  Delta troponin is negative.  Chest x-ray is unremarkable.  Given the patient's cardiac history, cardiology was consulted.  Dilaudid and Zofran given for pain and nausea.  Patient care transferred to PA Geiple at the end of my shift. Patient presentation, ED course, and plan of care discussed with review of all pertinent labs and imaging. Please see his/her note for further details regarding further ED course and disposition.   Final Clinical Impressions(s) / ED Diagnoses   Final diagnoses:  None    ED Discharge Orders    None        Joanne Gavel, PA-C 09/28/18 0737    Ripley Fraise, MD 09/29/18 [phone]

## 2018-09-28 NOTE — Progress Notes (Signed)
  Echocardiogram 2D Echocardiogram has been performed.  Farran Amsden L Androw 09/28/2018, 10:17 AM

## 2018-09-28 NOTE — Consult Note (Signed)
Cardiology Consultation:   Patient ID: [name]; [id number]; 07-04-1956   Admit date: 09/28/2018 Date of Consult: 09/28/2018  Primary Care Provider: [name], MD Primary Cardiologist: Anthony Grooms, MD   Patient Profile:   [name] is a 63 y.o. male with a hx of CAD s/p emergent CABG 2017, last cath 10/05/17 with DES to proximal RCA, systolic heart failure secondary to ischemic cardiomyopathy, hepatitis C s/p treatment, hx of CVA, HTN, and anxietywho presents with complaints of shoulder pain with mild radiation to chest.   History of Present Illness:   [name] 63 y.o. male with a hx of CAD s/p emergent CABG 2017, last cath 10/05/17 with DES to proximal RCA, hx of CVA, HTN, and anxietywho presented with shoulder pain which initially began yesterday morning.  Patient reports he awoke yesterday morning, 09/27/2018 with significan left upper shoulder and arm pain with associated nausea. He took 600 mg of ibuprofen with minimal pain relief.  Given his ongoing symptoms, he was seen at an urgent care in which she was prescribed muscle relaxers for suspected muscle discomfort.  He states that he took it easy yesterday went to sleep and woke from sleep around 2 AM this morning with severe shoulder pain which radiated to his central chest pain (most worrisome given his past history).  He reports it has been fairly constant since onset.  Patient states he took his blood pressure at home and received a reading of 90/60 which was reported as low for him.  He took 2 SL NTG and 324 ASA with no improvement in the symptoms and therefore called EMS for further evaluation.  In the ED, he was given 100 mcg of fentanyl which improved his pain to a 3 out of 10 however he reports the pain was short-lived and has returned once again on interview.  Patient reports since his last cardiac cath he has been doing well from a cardiac perspective.  He denies recurrent chest pain, palpitations, LE swelling,  orthopnea, recent illness with fever, chills, nausea or vomiting, dizziness or syncope.  No increased pain with inspiration.  Bilateral pedal pulses equal.  No new murmur noted. Patient reports ongoing medication compliance however has reduced his metoprolol dose by half? secondary to increased fatigue.  In the ED, initial i-STAT troponin 0 0.00 with subsequent draw at 0.00.  EKG with NSR and no acute ischemic changes.  CXR no evidence of active disease.  Of note, history of CAD with MI in 10/2015 treated with emergent CABG,last cath 10/05/17 with DES to proximal RCA. Hx of ICM with LVEF of 20-25% during CABG with improved on subsequent echocardiograms.   Was hospitalized 01/2018 for chest pain and palpitations and found to have ectopic atrial tachycardia. Beta blocker was titrated. D-dimer was negative. Due to continued complaints of chest pain, CT angio performed and was negative for aortic syndrome. Chest pain was thought to be related to his thoracic spine disease with referred pain.    He was last seen 02/2018 when admitted for pre-syncope, not felt to be cardiac in origin. Since then he has missed multiple office visits. He was subsequently scheduled for event monitor set up 06/13/2018 which showed normal sinus rhythm predominantly with occasional sinus bradycardia and sinus tachycardia.  There was no atrial fibrillation or pathologic arrhythmias and no cardiac cause of syncope identified.  Past Medical History:  Diagnosis Date  . Anxiety   . Basal cell carcinoma (BCC) of forehead   . CAD (coronary artery disease)  a. 10/2015 ant STEMI >> LHC with 3 v CAD; oLAD tx with POBA >> emergent CABG. b. Multiple evals since that time, early graft failure of SVG-RCA by cath 03/2016. c. 2/19 PCI/DES x1 to pRCA, normal EF.  . Carotid artery disease ([place])    a. 40-59% BICA 02/2018.  Marland Kitchen Depression   . Dyspnea   . Ectopic atrial tachycardia ([place])   . Esophageal reflux    eosinophil esophagitis  . Family  history of adverse reaction to anesthesia    "sister has PONV" (06/21/2017)  . Former tobacco use   . Gout   . Hepatitis C    "treated and cured" (06/21/2017)  . High cholesterol   . Hypertension   . Ischemic cardiomyopathy    a. EF 25-30% at intraop TEE 4/17  //  b. Limited Echo 5/17 - EF 45-50%, mild ant HK. c. EF 55-65% by cath 09/2017.  . Migraine    "3-4/yr" (06/21/2017)  . Myocardial infarction ([place]) 10/2015  . Palpitations   . Sinus bradycardia    a. HR dropping into 40s in 02/2016 -> BB reduced.  . Stroke ([place]) 10/2016   "small one; sometimes my memory/cognitive issues" (06/21/2017)  . Symptomatic hypotension    a. 02/2016 ER visit -> meds reduced.  . Syncope     Past Surgical History:  Procedure Laterality Date  . BASAL CELL CARCINOMA EXCISION     "forehead  . CARDIAC CATHETERIZATION N/A 11/28/2015   Procedure: Left Heart Cath and Coronary Angiography;  Surgeon: [name], MD;  Location: [facility];  Service: Cardiovascular;  Laterality: N/A;  . CARDIAC CATHETERIZATION N/A 11/28/2015   Procedure: Coronary Balloon Angioplasty;  Surgeon: [name], MD;  Location: [facility];  Service: Cardiovascular;  Laterality: N/A;  ostial LAD  . CARDIAC CATHETERIZATION N/A 11/28/2015   Procedure: Coronary/Graft Angiography;  Surgeon: [name], MD;  Location: [facility];  Service: Cardiovascular;  Laterality: N/A;  coronaries only   . CARDIAC CATHETERIZATION N/A 04/21/2016   Procedure: Left Heart Cath and Coronary Angiography;  Surgeon: Wellington Hampshire, MD;  Location: [facility];  Service: Cardiovascular;  Laterality: N/A;  . CARDIAC CATHETERIZATION N/A 06/14/2016   Procedure: Left Heart Cath and Cors/Grafts Angiography;  Surgeon: Lorretta Harp, MD;  Location: [facility];  Service: Cardiovascular;  Laterality: N/A;  . CARDIAC CATHETERIZATION N/A 09/08/2016   Procedure: Left Heart Cath and Cors/Grafts Angiography;   Surgeon: Wellington Hampshire, MD;  Location: [facility];  Service: Cardiovascular;  Laterality: N/A;  . CARDIAC CATHETERIZATION    . CORONARY ARTERY BYPASS GRAFT N/A 11/28/2015   Procedure: CORONARY ARTERY BYPASS GRAFTING (CABG) TIMES FIVE USING LEFT INTERNAL MAMMARY ARTERY AND RIGHT GREATER SAPHENOUS,VIEN HARVEATED BY ENDOVIEN, INTRAOPPRATIVE TEE;  Surgeon: Gaye Pollack, MD;  Location: [facility];  Service: Open Heart Surgery;  Laterality: N/A;  . CORONARY STENT INTERVENTION N/A 10/05/2017   Procedure: CORONARY STENT INTERVENTION;  Surgeon: [name], MD;  Location: [facility];  Service: Cardiovascular;  Laterality: N/A;  . HUMERUS SURGERY Right 1969   "tumor inside bone; filled it w/bone chips"  . LEFT HEART CATH AND CORS/GRAFTS ANGIOGRAPHY N/A 03/11/2017   Procedure: Left Heart Cath and Cors/Grafts Angiography;  Surgeon: Leonie Man, MD;  Location: [facility];  Service: Cardiovascular;  Laterality: N/A;  . LEFT HEART CATH AND CORS/GRAFTS ANGIOGRAPHY N/A 10/05/2017   Procedure: LEFT HEART CATH AND CORS/GRAFTS  ANGIOGRAPHY;  Surgeon: [name], MD;  Location: [facility];  Service: Cardiovascular;  Laterality: N/A;  . PERIPHERAL VASCULAR CATHETERIZATION N/A 06/14/2016   Procedure: Lower Extremity Angiography;  Surgeon: Lorretta Harp, MD;  Location: [facility];  Service: Cardiovascular;  Laterality: N/A;     Prior to Admission medications   Medication Sig Start Date End Date Taking? Authorizing Provider  acetaminophen (TYLENOL) 500 MG tablet Take 1,000 mg by mouth every 6 (six) hours as needed for mild pain or headache.    Yes [provider]  amLODipine (NORVASC) 10 MG tablet TAKE 1 TABLET BY MOUTH DAILY Patient taking differently: Take 10 mg by mouth daily.  08/07/18  Yes [name], MD  aspirin 81 MG tablet Take 81 mg by mouth daily.   Yes [provider]  atorvastatin (LIPITOR) 80 MG tablet TAKE 1 TABLET(80  MG) BY MOUTH DAILY Patient taking differently: Take 80 mg by mouth daily.  09/21/18  Yes [name], MD  chlordiazePOXIDE (LIBRIUM) 10 MG capsule Take 10-20 mg by mouth at bedtime as needed for anxiety.    Yes [provider]  clopidogrel (PLAVIX) 75 MG tablet TAKE 1 TABLET BY MOUTH EVERY DAY Patient taking differently: Take 75 mg by mouth daily.  05/26/18  Yes Weaver, Scott T, PA-C  cyclobenzaprine (FLEXERIL) 5 MG tablet Take 5 mg by mouth 3 (three) times daily as needed for muscle spasms.   Yes [provider]  dicyclomine (BENTYL) 20 MG tablet Take 1 tablet (20 mg total) by mouth 2 (two) times daily as needed for spasms. 09/18/18  Yes Hayden Rasmussen, MD  fluticasone [facility]) 50 MCG/ACT nasal spray Place 1 spray into both nostrils as needed for allergies.  05/26/17  Yes [provider]  lisinopril (PRINIVIL,ZESTRIL) 5 MG tablet Take 5 mg by mouth daily. May take 1 additional tablet once daily for SBP greater than 150 once hour after taking daily dose Patient taking differently: Take 5 mg by mouth See admin instructions. Take 5 mg by mouth daily. May take 1 additional tablet once daily for SBP greater than 150 once hour after taking daily dose 01/18/18  Yes [name], MD  metoprolol succinate (TOPROL XL) 50 MG 24 hr tablet Take 1 tablet (50 mg total) by mouth as directed. 50 mg in the AM and 25 mg in the PM Patient taking differently: Take 50 mg by mouth daily.  03/08/18  Yes Weaver, Scott T, PA-C  Multiple Vitamins-Minerals (ONE-A-DAY MENS 50+ ADVANTAGE PO) Take 1 tablet by mouth daily.    Yes [provider]  nitroGLYCERIN (NITROSTAT) 0.4 MG SL tablet PLACE 1 TABLET UNDER THE TONGUE EVERY 5 MINUTES AS NEEDED FOR CHEST PAIN. 3 DOSES MAX Patient taking differently: Place 0.4 mg under the tongue every 5 (five) minutes as needed for chest pain.  08/08/18  Yes Strader, Radford, PA-C  pantoprazole (PROTONIX) 40 MG tablet Take 40 mg by mouth daily.    Yes [provider]  ranitidine (ZANTAC) 150 MG tablet Take 150 mg by mouth as needed for heartburn.   Yes [provider]  ranolazine (RANEXA) 1000 MG SR tablet Take 1 tablet (1,000 mg total) by mouth 2 (two) times daily. Patient taking differently: Take 1,000 mg by mouth daily.  02/24/18  Yes Weaver, Scott T, PA-C  VIIBRYD 10 MG TABS Take 10 mg by mouth daily. 06/10/18  Yes [provider]  zolpidem (AMBIEN CR) 12.5 MG CR tablet  Take 12.5 mg by mouth at bedtime as needed for sleep.  06/10/18  Yes [provider]  sucralfate (CARAFATE) 1 g tablet Take 1 tablet (1 g total) by mouth 4 (four) times daily -  with meals and at bedtime. Patient not taking: Reported on 09/28/2018 07/13/18 08/12/18  Lennice Sites, DO    Inpatient Medications: Scheduled Meds:  Continuous Infusions:  PRN Meds:   Allergies:    Allergies  Allergen Reactions  . Prednisone Other (See Comments)    States that this med makes him "crazy"  . Tetanus Toxoids Swelling and Other (See Comments)    Fever, Swelling of the arm   . Wellbutrin [Bupropion] Other (See Comments)    Crazy thoughts, nightmares  . Morphine And Related Hives and Itching    Redness at the injection site  . Scallops [Shellfish Allergy] Nausea Only  . Chantix [Varenicline] Other (See Comments)    Dreams    Social History:   Social History   Socioeconomic History  . Marital status: Married    Spouse name: [name]  . Number of children: 3  . Years of education: College  . Highest education level: Not on file  Occupational History  . Occupation: Scientist, research (physical sciences): SELF-EMPLOYED  Social Needs  . Financial resource strain: Not on file  . Food insecurity:    Worry: Not on file    Inability: Not on file  . Transportation needs:    Medical: Not on file    Non-medical: Not on file  Tobacco Use  . Smoking status: Former Smoker    Packs/day: 0.75    Years: 44.00    Pack years: 33.00    Types:  Cigarettes    Last attempt to quit: 11/28/2015    Years since quitting: 2.8  . Smokeless tobacco: Never Used  Substance and Sexual Activity  . Alcohol use: No    Frequency: Never    Comment: 06/21/2017 "I'll have a few drinks q couple months"  . Drug use: No    Comment: 06/21/2017 "nothing since the 1980s"  . Sexual activity: Yes  Lifestyle  . Physical activity:    Days per week: Not on file    Minutes per session: Not on file  . Stress: Not on file  Relationships  . Social connections:    Talks on phone: Not on file    Gets together: Not on file    Attends religious service: Not on file    Active member of club or organization: Not on file    Attends meetings of clubs or organizations: Not on file    Relationship status: Not on file  . Intimate partner violence:    Fear of current or ex partner: Not on file    Emotionally abused: Not on file    Physically abused: Not on file    Forced sexual activity: Not on file  Other Topics Concern  . Not on file  Social History Narrative   Patient lives at home with his spouse.   Caffeine Use: yes    Family History:   Family History  Problem Relation Age of Onset  . Lung cancer Mother   . Heart Problems Father   . Heart attack Father 59  . Stroke Father   . Heart failure Father   . Heart attack Maternal Grandmother   . Stroke Maternal Grandmother   . Heart attack Paternal Uncle   . Hypertension Brother   . Autoimmune disease Neg Hx  Family Status:  Family Status  Relation Name Status  . Mother  Deceased at age 68  . Father  Deceased at age 50  . MGM  Deceased  . Annamarie Major  Deceased  . Brother  (Not Specified)  . MGF  Deceased  . PGF  Deceased  . PGM  Deceased  . Neg Hx  (Not Specified)    ROS:  Please see the history of present illness.  All other ROS reviewed and negative.     Physical Exam/Data:   Vitals:   09/28/18 0455 09/28/18 0500 09/28/18 0515 09/28/18 0545  BP:  118/70 119/75 104/68  Pulse: 82 66  80 63  Resp:  14 11 16   Temp:      TempSrc:      SpO2: 98% 98% 100% 95%  Weight:      Height:       No intake or output data in the 24 hours ending 09/28/18 0725 Filed Weights   09/28/18 0327  Weight: 86.2 kg   Body mass index is 28.06 kg/m.   General: Well developed, well nourished, NAD Skin: Warm, dry, intact  Head: Normocephalic, atraumatic, clear, moist mucus membranes. Neck: Negative for carotid bruits. No JVD Lungs:Clear to ausculation bilaterally. No wheezes, rales, or rhonchi. Breathing is unlabored. Cardiovascular: RRR with S1 S2. No murmurs, rubs, gallops, or LV heave appreciated. Abdomen: Soft, non-tender, non-distended with normoactive bowel sounds. No obvious abdominal masses. MSK: Strength and tone appear normal for age. 5/5 in all extremities Extremities: No edema. No clubbing or cyanosis. DP/PT pulses 2+ bilaterally Neuro: Alert and oriented. No focal deficits. No facial asymmetry. MAE spontaneously. Psych: Responds to questions appropriately with normal affect.     EKG:  The EKG was personally reviewed and demonstrates: 09/28/2018 NSR with no acute ischemic changes Telemetry:  Telemetry was personally reviewed and demonstrates: 09/28/2018, NSR  Relevant CV Studies:  ECHO: 03/25/2018:  Study Conclusions  - Left ventricle: The cavity size was normal. Systolic function was   normal. The estimated ejection fraction was in the range of 60%   to 65%. Wall motion was normal; there were no regional wall   motion abnormalities. Left ventricular diastolic function   parameters were normal. - Aortic valve: Trileaflet; mildly thickened, mildly calcified   leaflets. - Mitral valve: There was trivial regurgitation. - Atrial septum: There was increased thickness of the septum,   consistent with lipomatous hypertrophy. - Tricuspid valve: There was trivial regurgitation.  CATH: 10/05/2017:   Ost LAD to Prox LAD lesion is 50% stenosed.  Mid LAD lesion is 55%  stenosed. LIMA to LAD is patent.  SVG to diagonal is occluded. There is no significant obstruction in the native diagonal.  Ost Cx lesion is 80% stenosed.  Prox Cx to Mid Cx lesion is 50% stenosed. SVG to OM is patent.  Dist RCA lesion is 50% stenosed.  RPDA lesion is 40% stenosed.  Ost RCA to Prox RCA lesion is 75% stenosed. Origin to Prox Graft lesion is 70% stenosed.  A drug-eluting stent was successfully placed using a STENT SYNERGY DES 4X16, postdilated to 4.5 mm.  Post intervention, there is a 0% residual stenosis.  The left ventricular systolic function is normal.  LV end diastolic pressure is normal.  The left ventricular ejection fraction is 55-65% by visual estimate.  There is no aortic valve stenosis.   Continue dual antiplatelet therapy for at least one year and, clopidogrel likely indefinitely.  Aggressive secondary prevention.    Laboratory Data:  Chemistry Recent Labs  Lab 09/28/18 0344  NA 140  K 4.2  CL 106  CO2 26  GLUCOSE 99  BUN 20  CREATININE 1.13  CALCIUM 9.0  GFRNONAA >60  GFRAA >60  ANIONGAP 8    Total Protein  Date Value Ref Range Status  09/18/2018 7.3 6.5 - 8.1 g/dL Final  11/08/2017 6.7 6.0 - 8.5 g/dL Final   Albumin  Date Value Ref Range Status  09/18/2018 4.3 3.5 - 5.0 g/dL Final  11/08/2017 4.4 3.6 - 4.8 g/dL Final   AST  Date Value Ref Range Status  09/18/2018 22 15 - 41 U/L Final   ALT  Date Value Ref Range Status  09/18/2018 24 0 - 44 U/L Final   Alkaline Phosphatase  Date Value Ref Range Status  09/18/2018 68 38 - 126 U/L Final   Total Bilirubin  Date Value Ref Range Status  09/18/2018 0.7 0.3 - 1.2 mg/dL Final   Bilirubin Total  Date Value Ref Range Status  11/08/2017 <0.2 0.0 - 1.2 mg/dL Final   Hematology Recent Labs  Lab 09/28/18 0344  WBC 7.9  RBC 4.29  HGB 12.6*  HCT 39.9  MCV 93.0  MCH 29.4  MCHC 31.6  RDW 12.8  PLT 202   Cardiac EnzymesNo results for input(s): TROPONINI in the last  168 hours.  Recent Labs  Lab 09/28/18 0336 09/28/18 0647  TROPIPOC 0.00 0.00    BNPNo results for input(s): BNP, PROBNP in the last 168 hours.  DDimer No results for input(s): DDIMER in the last 168 hours. TSH:  Lab Results  Component Value Date   TSH 1.319 02/07/2018   Lipids: Lab Results  Component Value Date   CHOL 114 11/08/2017   HDL 52 11/08/2017   LDLCALC 43 11/08/2017   TRIG 93 11/08/2017   CHOLHDL 2.2 11/08/2017   HgbA1c: Lab Results  Component Value Date   HGBA1C 5.0 11/10/2016   Radiology/Studies:  Dg Chest 2 View  Result Date: 09/28/2018 CLINICAL DATA:  Chest pain EXAM: CHEST - 2 VIEW COMPARISON:  07/13/2018 FINDINGS: Normal heart size and mediastinal contours. CABG. No acute infiltrate or edema. No effusion or pneumothorax. Spondylosis. No acute osseous findings. Spondylosis. Artifact from EKG leads. IMPRESSION: No evidence of active disease. Electronically Signed   By: Monte Fantasia M.D.   On: 09/28/2018 04:22   Assessment and Plan:   1.  Left shoulder pain with radiation to central chest: -Patient with prior history of CAD s/p emergent CABG in 2017 with subsequent PCI per cardiac cath 10/05/2017 -Patient reports fairly constant left shoulder pain which began yesterday morning unrelieved with SL NTG and ibuprofen.  Seen in urgent care in which she was prescribed muscle relaxers for muscle pain.  Woke early this morning with similar symptoms however with radiation to central chest, concerning for ACS.  Patient given 100 mg of fentanyl on ED presentation with relief -Troponin, negative x2 -EKG with no acute ischemic changes -CXR with no active cardiopulmonary disease -Will continue to cycle enzymes, if elevation could consider further ischemic work-up however suspect this is not cardiac in nature given negative enzymes with fairly constant pain since yesterday morning and no EKG changes. -Continue ASA 81, high intensity statin, Plavix 75, lisinopril 5, amlodipine  10, Ranexa 1000 mg -Patient reports taking unknown amount of Toprol XL.  Given stable BP could consider reducing dose to 50 mg daily as opposed to 50 mg in the a.m. and 25 mg in the p.m. as stated on  home med rec  2. HTN: -Stable, 104/68, 119/75, 118/70 -Continue current regimen -Need to be followed in the outpatient setting for further medication titration -Has had multiple " no-shows" for follow-up visits post procedure   Final recommendations per MD to follow  For questions or updates, please contact [place] Please consult [email or web address] for contact info under Cardiology/STEMI.   SignedKathyrn Drown NP-C [facility] Pager: [phone] 09/28/2018 7:25 AM

## 2018-09-28 NOTE — ED Notes (Signed)
Pt to CT with transporter.

## 2018-09-28 NOTE — Discharge Instructions (Signed)
Please read and follow all provided instructions.  Your diagnoses today include:  1. Cervical radiculopathy     Tests performed today include:  An EKG of your heart  A chest x-ray  Cardiac enzymes - a blood test for heart muscle damage  Blood counts and electrolytes  MRI of your cervical and thoracic spine --shows arthritis at multiple levels and possible pinched nerves which may explain the pain you are having  Echocardiogram - shows good function of your heart  CT of your chest - shows slightly enlarged ascending aorta (no aneurysm) without other problems  Vital signs. See below for your results today.   Medications prescribed:   Percocet (oxycodone/acetaminophen) - narcotic pain medication  DO NOT drive or perform any activities that require you to be awake and alert because this medicine can make you drowsy. BE VERY CAREFUL not to take multiple medicines containing Tylenol (also called acetaminophen). Doing so can lead to an overdose which can damage your liver and cause liver failure and possibly death.   Robaxin (methocarbamol) - muscle relaxer medication  DO NOT drive or perform any activities that require you to be awake and alert because this medicine can make you drowsy.   Take any prescribed medications only as directed.  Follow-up instructions: Please follow-up with your primary care provider and the neurosurgeon listed in the next 1-2 weeks for recheck of your symptoms and further advice.   Return instructions:  SEEK IMMEDIATE MEDICAL ATTENTION IF:  You have severe chest pain, especially if the pain is crushing or pressure-like and spreads to the arms, back, neck, or jaw, or if you have sweating, nausea (feeling sick to your stomach), or shortness of breath. THIS IS AN EMERGENCY. Don't wait to see if the pain will go away. Get medical help at once. Call 911 or 0 (operator). DO NOT drive yourself to the hospital.   Your chest pain gets worse and does not go  away with rest.   You have an attack of chest pain lasting longer than usual, despite rest and treatment with the medications your caregiver has prescribed.   You wake from sleep with chest pain or shortness of breath.  You feel dizzy or faint.  You have chest pain not typical of your usual pain for which you originally saw your caregiver.   You have any other emergent concerns regarding your health.  Additional Information: Chest pain comes from many different causes. Your caregiver has diagnosed you as having chest pain that is not specific for one problem, but does not require admission.  You are at low risk for an acute heart condition or other serious illness.   Your vital signs today were: BP 119/80    Pulse 66    Temp 98.3 F (36.8 C) (Oral)    Resp 17    Ht 5\' 9"  (1.753 m)    Wt 86.2 kg    SpO2 96%    BMI 28.06 kg/m  If your blood pressure (BP) was elevated above 135/85 this visit, please have this repeated by your doctor within one month. --------------

## 2018-09-28 NOTE — ED Triage Notes (Signed)
BIB EMS from home. Pt reports upper back pain/spasms onset yesterday, went to [facility] and given pain medicine with no relief. Pt reports onset of L arm pain with radiation into chest last night. Pt took 2NTG and 324 ASA upon arrival with no change in pain. EMS gave 100 Fentanyl which took pain from 8/10 to 3/10. VSS.

## 2018-10-03 ENCOUNTER — Telehealth: Payer: Self-pay | Admitting: *Deleted

## 2018-10-03 NOTE — Telephone Encounter (Signed)
   [facility] Pre-operative Risk Assessment    Request for surgical clearance:  1. What type of surgery is being performed? C7-T1 ANTERIOR CERVICAL FUSION   2. When is this surgery scheduled? 10/06/18   3. What type of clearance is required (medical clearance vs. Pharmacy clearance to hold med vs. Both)? MEDICAL  4. Are there any medications that need to be held prior to surgery and how long?PLAVIX    5. Practice name and name of physician performing surgery? [facility]; DR. KYLE CABBELL   6. What is your office phone number [phone]    7.   What is your office fax number [phone]  8.   Anesthesia type (None, local, MAC, general) ? GENERAL   [name] 10/03/2018, 11:23 AM  _________________________________________________________________   (provider comments below)

## 2018-10-03 NOTE — Telephone Encounter (Signed)
PT IS SCHEDULED TO SEE MICHELE LENZE 10-04-18 12:30P FOR CLEARANCE

## 2018-10-03 NOTE — Telephone Encounter (Signed)
   Mathews, MD  Chart reviewed as part of pre-operative protocol coverage. Because of Zackarie Heishman's past medical history and time since last visit, he/she will require a follow-up visit in order to better assess preoperative cardiovascular risk.  Pre-op covering staff: - Please schedule appointment and call patient to inform them. - Please contact requesting surgeon's office via preferred method (i.e, phone, fax) to inform them of need for appointment prior to surgery.  If applicable, this message will also be routed to pharmacy pool and/or primary cardiologist for input on holding anticoagulant/antiplatelet agent as requested below so that this information is available at time of patient's appointment.   Tami Lin Yancy Hascall, PA  10/03/2018, 1:25 PM

## 2018-10-04 ENCOUNTER — Encounter: Payer: Self-pay | Admitting: Physician Assistant

## 2018-10-04 ENCOUNTER — Ambulatory Visit: Payer: BC Managed Care – PPO | Admitting: Physician Assistant

## 2018-10-04 VITALS — BP 124/84 | HR 97 | Ht 69.0 in | Wt 196.8 lb

## 2018-10-04 DIAGNOSIS — I10 Essential (primary) hypertension: Secondary | ICD-10-CM

## 2018-10-04 DIAGNOSIS — E785 Hyperlipidemia, unspecified: Secondary | ICD-10-CM

## 2018-10-04 DIAGNOSIS — Z01818 Encounter for other preprocedural examination: Secondary | ICD-10-CM | POA: Diagnosis not present

## 2018-10-04 DIAGNOSIS — I255 Ischemic cardiomyopathy: Secondary | ICD-10-CM

## 2018-10-04 DIAGNOSIS — I471 Supraventricular tachycardia: Secondary | ICD-10-CM

## 2018-10-04 DIAGNOSIS — I25810 Atherosclerosis of coronary artery bypass graft(s) without angina pectoris: Secondary | ICD-10-CM

## 2018-10-04 NOTE — Progress Notes (Signed)
Cardiology Office Note    Date:  10/04/2018   ID:  [name], DOB 06-16-1956, MRN [id number]  PCP:  [name], MD  Cardiologist: Larae Grooms, MD EPS: None  Chief Complaint  Patient presents with  . Pre-op Exam    History of Present Illness:  Ashely Villa is a 63 y.o. 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  Patient last saw [name], PA-C 02/24/18 and increase his Ranexa to 1000 mgs twice daily and metoprolol 100 mg daily.  Seen 02/2018 for presyncope not felt to be cardiac in origin.   Patient was in the ER 06/09/2018 with palpitations and heart rate up to 120 but normal when he arrived.  Also had atypical chest pain anxiety was felt to be a component and he requested narcotics for relief. Event monitor 06/13/2018 normal sinus rhythm with occasional sinus bradycardia and sinus tachycardia.  No atrial fibrillation or pathologic arrhythmias.  Patient back in the ER 09/28/2018 for atypical chest pain that was reproducible.  2D echo normal LVEF 55 to 60% with no valvular problems.  CTA no evidence of aortic dissection, mild prominence of the ascending aorta at 3.9 x 3.9 cm.  Patient placed on my schedule for cardiac clearance for C7-T1 anterior cervical fusion to be done by Dr. Ashok Pall with [facility] neurosurgery and spine.  Patient says he was scheduled 10/06/2018 but anesthesia told him he had to be off Plavix and aspirin for 10 days so they rescheduled it for 10/13/2018.  Patient already stopped his Plavix and aspirin 10/02/2018 without clearance from Dr. Irish Lack.  He denies any chest pain, palpitations, dyspnea, dyspnea on exertion,  dizziness or presyncope.  He is in a lot of pain and says he has to have this done.  Past Medical History:  Diagnosis Date  . Anxiety   . Basal cell carcinoma (BCC) of forehead   . CAD (coronary artery disease)    a. 10/2015 ant STEMI >> LHC with 3 v CAD; oLAD tx with POBA >> emergent CABG. b. Multiple evals since that time, early graft failure of SVG-RCA by cath 03/2016. c. 2/19 PCI/DES x1 to pRCA, normal EF.  . Carotid artery disease ([place])    a. 40-59% BICA 02/2018.  Marland Kitchen Depression   . Dyspnea   . Ectopic atrial tachycardia ([place])   . Esophageal reflux    eosinophil esophagitis  . Family history of adverse reaction to anesthesia    "sister has PONV" (06/21/2017)  . Former tobacco use   . Gout   . Hepatitis C    "treated and cured" (06/21/2017)  . High cholesterol   . Hypertension   . Ischemic cardiomyopathy    a. EF 25-30% at intraop TEE 4/17  //  b. Limited Echo 5/17 - EF 45-50%, mild ant HK. c. EF 55-65% by cath 09/2017.  . Migraine    "3-4/yr" (06/21/2017)  . Myocardial infarction ([place]) 10/2015  . Palpitations   . Sinus bradycardia    a. HR dropping into 40s in 02/2016 -> BB reduced.  . Stroke ([place]) 10/2016   "small one; sometimes my memory/cognitive issues" (06/21/2017)  . Symptomatic hypotension    a. 02/2016 ER visit ->  meds reduced.  . Syncope     Past Surgical History:  Procedure Laterality Date  . BASAL CELL CARCINOMA EXCISION     "forehead  . CARDIAC CATHETERIZATION N/A 11/28/2015   Procedure: Left Heart Cath and Coronary Angiography;  Surgeon: [name], MD;  Location: [place] [facility];  Service: Cardiovascular;  Laterality: N/A;  . CARDIAC CATHETERIZATION N/A 11/28/2015   Procedure: Coronary Balloon Angioplasty;  Surgeon: [name], MD;  Location: [place] [facility];  Service: Cardiovascular;  Laterality: N/A;  ostial LAD  . CARDIAC CATHETERIZATION N/A 11/28/2015   Procedure: Coronary/Graft Angiography;  Surgeon: [name], MD;   Location: [place] [facility];  Service: Cardiovascular;  Laterality: N/A;  coronaries only   . CARDIAC CATHETERIZATION N/A 04/21/2016   Procedure: Left Heart Cath and Coronary Angiography;  Surgeon: Wellington Hampshire, MD;  Location: [place] [facility];  Service: Cardiovascular;  Laterality: N/A;  . CARDIAC CATHETERIZATION N/A 06/14/2016   Procedure: Left Heart Cath and Cors/Grafts Angiography;  Surgeon: Lorretta Harp, MD;  Location: [place] [facility];  Service: Cardiovascular;  Laterality: N/A;  . CARDIAC CATHETERIZATION N/A 09/08/2016   Procedure: Left Heart Cath and Cors/Grafts Angiography;  Surgeon: Wellington Hampshire, MD;  Location: [place] [facility];  Service: Cardiovascular;  Laterality: N/A;  . CARDIAC CATHETERIZATION    . CORONARY ARTERY BYPASS GRAFT N/A 11/28/2015   Procedure: CORONARY ARTERY BYPASS GRAFTING (CABG) TIMES FIVE USING LEFT INTERNAL MAMMARY ARTERY AND RIGHT GREATER SAPHENOUS,VIEN HARVEATED BY ENDOVIEN, INTRAOPPRATIVE TEE;  Surgeon: Gaye Pollack, MD;  Location: [place];  Service: Open Heart Surgery;  Laterality: N/A;  . CORONARY STENT INTERVENTION N/A 10/05/2017   Procedure: CORONARY STENT INTERVENTION;  Surgeon: [name], MD;  Location: [place] [facility];  Service: Cardiovascular;  Laterality: N/A;  . HUMERUS SURGERY Right 1969   "tumor inside bone; filled it w/bone chips"  . LEFT HEART CATH AND CORS/GRAFTS ANGIOGRAPHY N/A 03/11/2017   Procedure: Left Heart Cath and Cors/Grafts Angiography;  Surgeon: Leonie Man, MD;  Location: [place] [facility];  Service: Cardiovascular;  Laterality: N/A;  . LEFT HEART CATH AND CORS/GRAFTS ANGIOGRAPHY N/A 10/05/2017   Procedure: LEFT HEART CATH AND CORS/GRAFTS ANGIOGRAPHY;  Surgeon: [name], MD;  Location: [place] [facility];  Service: Cardiovascular;  Laterality: N/A;  . PERIPHERAL VASCULAR CATHETERIZATION N/A 06/14/2016   Procedure: Lower Extremity Angiography;  Surgeon: Lorretta Harp, MD;  Location: [place] [facility];  Service: Cardiovascular;  Laterality: N/A;    Current Medications: Current Meds  Medication Sig  . acetaminophen (TYLENOL) 500 MG tablet Take 1,000 mg by mouth every 6 (six) hours as needed for mild pain or headache.   Marland Kitchen amLODipine (NORVASC) 10 MG tablet TAKE 1 TABLET BY MOUTH DAILY (Patient taking differently: Take 10 mg by mouth daily. )  . aspirin 81 MG tablet Take 81 mg by mouth daily.  Marland Kitchen atorvastatin (LIPITOR) 80 MG tablet TAKE 1 TABLET(80 MG) BY MOUTH DAILY (Patient taking differently: Take 80 mg by mouth daily. )  . chlordiazePOXIDE (LIBRIUM) 10 MG capsule Take 10-20 mg by mouth at bedtime as needed for anxiety.   Marland Kitchen lisinopril (PRINIVIL,ZESTRIL) 5 MG tablet Take 5 mg by mouth daily. May take 1 additional tablet once daily for SBP greater than 150 once hour after taking daily dose (Patient taking differently: Take 5 mg by mouth See admin instructions. Take 5 mg by mouth daily. May take 1 additional tablet once  daily for SBP greater than 150 once hour after taking daily dose)  . metoprolol succinate (TOPROL XL) 50 MG 24 hr tablet Take 1 tablet (50 mg total) by mouth as directed. 50 mg in the AM and 25 mg in the PM (Patient taking differently: Take 50 mg by mouth daily. )  . Multiple Vitamins-Minerals (ONE-A-DAY MENS 50+ ADVANTAGE PO) Take 1 tablet by mouth daily.   . nitroGLYCERIN (NITROSTAT) 0.4 MG SL tablet PLACE 1 TABLET UNDER THE TONGUE EVERY 5 MINUTES AS NEEDED FOR CHEST PAIN. 3 DOSES MAX (Patient taking differently: Place 0.4 mg under the tongue every 5 (five) minutes as needed for chest pain. )  . oxyCODONE-acetaminophen (PERCOCET/ROXICET) 5-325 MG tablet Take 1 tablet by mouth every 6 (six) hours as needed for severe pain.  . pantoprazole (PROTONIX) 40 MG tablet Take 40 mg by mouth daily.  . ranitidine (ZANTAC) 150 MG tablet Take 150 mg by mouth as needed for heartburn.  . ranolazine (RANEXA) 1000 MG SR tablet Take 500 mg by mouth daily.  Marland Kitchen VIIBRYD 10 MG TABS Take 10  mg by mouth daily.  Marland Kitchen zolpidem (AMBIEN CR) 12.5 MG CR tablet Take 12.5 mg by mouth at bedtime as needed for sleep.   . [DISCONTINUED] ranolazine (RANEXA) 1000 MG SR tablet Take 1 tablet (1,000 mg total) by mouth 2 (two) times daily. (Patient taking differently: Take 1,000 mg by mouth daily. )     Allergies:   Prednisone; Tetanus toxoids; Wellbutrin [bupropion]; Morphine and related; Scallops [shellfish allergy]; and Chantix [varenicline]   Social History   Socioeconomic History  . Marital status: Married    Spouse name: [name]  . Number of children: 3  . Years of education: College  . Highest education level: Not on file  Occupational History  . Occupation: Scientist, research (physical sciences): SELF-EMPLOYED  Social Needs  . Financial resource strain: Not on file  . Food insecurity:    Worry: Not on file    Inability: Not on file  . Transportation needs:    Medical: Not on file    Non-medical: Not on file  Tobacco Use  . Smoking status: Former Smoker    Packs/day: 0.75    Years: 44.00    Pack years: 33.00    Types: Cigarettes    Last attempt to quit: 11/28/2015    Years since quitting: 2.8  . Smokeless tobacco: Never Used  Substance and Sexual Activity  . Alcohol use: No    Frequency: Never    Comment: 06/21/2017 "I'll have a few drinks q couple months"  . Drug use: No    Comment: 06/21/2017 "nothing since the 1980s"  . Sexual activity: Yes  Lifestyle  . Physical activity:    Days per week: Not on file    Minutes per session: Not on file  . Stress: Not on file  Relationships  . Social connections:    Talks on phone: Not on file    Gets together: Not on file    Attends religious service: Not on file    Active member of club or organization: Not on file    Attends meetings of clubs or organizations: Not on file    Relationship status: Not on file  Other Topics Concern  . Not on file  Social History Narrative   Patient lives at home with his spouse.   Caffeine Use: yes       Family History:  The patient's family history includes Heart Problems in his father;  Heart attack in his maternal grandmother and paternal uncle; Heart attack (age of onset: 58) in his father; Heart failure in his father; Hypertension in his brother; Lung cancer in his mother; Stroke in his father and maternal grandmother.   ROS:   Please see the history of present illness.    Review of Systems  Constitution: Negative.  HENT: Negative.   Cardiovascular: Negative.   Respiratory: Negative.   Endocrine: Negative.   Hematologic/Lymphatic: Negative.   Musculoskeletal: Positive for back pain and neck pain.  Gastrointestinal: Negative.   Genitourinary: Negative.   Neurological: Negative.    All other systems reviewed and are negative.   PHYSICAL EXAM:   VS:  BP 124/84   Pulse 97   Ht 5\' 9"  (1.753 m)   Wt 196 lb 12.8 oz (89.3 kg)   SpO2 94%   BMI 29.06 kg/m   Physical Exam  GEN: Well nourished, well developed, in no acute distress  Neck: no JVD, carotid bruits, or masses Cardiac:RRR; no murmurs, rubs, or gallops  Respiratory: Decreased breath sounds with scattered wheezes GI: soft, nontender, nondistended, + BS Ext: without cyanosis, clubbing, or edema, Good distal pulses bilaterally Neuro:  Alert and Oriented x 3 Psych: euthymic mood, full affect  Wt Readings from Last 3 Encounters:  10/04/18 196 lb 12.8 oz (89.3 kg)  09/28/18 190 lb (86.2 kg)  09/18/18 188 lb (85.3 kg)      Studies/Labs Reviewed:   EKG:  EKG is not ordered today.  The ekg reviewed from 09/28/2018 normal sinus rhythm, normal EKG Recent Labs: 02/07/2018: Magnesium 2.1; TSH 1.319 06/09/2018: B Natriuretic Peptide 18.5 09/18/2018: ALT 24 09/28/2018: BUN 20; Creatinine, Ser 1.13; Hemoglobin 12.6; Platelets 202; Potassium 4.2; Sodium 140   Lipid Panel    Component Value Date/Time   CHOL 114 11/08/2017 0833   TRIG 93 11/08/2017 0833   HDL 52 11/08/2017 0833   CHOLHDL 2.2 11/08/2017 0833   CHOLHDL 2.2  06/22/2017 0256   VLDL 27 06/22/2017 0256   LDLCALC 43 11/08/2017 0833    Additional studies/ records that were reviewed today include:   2D echo 09/28/2018  IMPRESSIONS      1. The left ventricle has normal systolic function of 29-93%. The cavity size is normal. There is normal left ventricular hypertrophy. Echo evidence of normal diastolic filling patterns.  2. Normal left atrial size.  3. Normal right atrial size.  4. Normal tricuspid valve.  5. Tricuspid regurgitation is mild.  6. No atrial level shunt detected by color flow Doppler.   ECHO: 03/25/2018:   Study Conclusions   - Left ventricle: The cavity size was normal. Systolic function was   normal. The estimated ejection fraction was in the range of 60%   to 65%. Wall motion was normal; there were no regional wall   motion abnormalities. Left ventricular diastolic function   parameters were normal. - Aortic valve: Trileaflet; mildly thickened, mildly calcified   leaflets. - Mitral valve: There was trivial regurgitation. - Atrial septum: There was increased thickness of the septum,   consistent with lipomatous hypertrophy. - Tricuspid valve: There was trivial regurgitation.   CATH: 10/05/2017:    Ost LAD to Prox LAD lesion is 50% stenosed.  Mid LAD lesion is 55% stenosed. LIMA to LAD is patent.  SVG to diagonal is occluded. There is no significant obstruction in the native diagonal.  Ost Cx lesion is 80% stenosed.  Prox Cx to Mid Cx lesion is 50% stenosed. SVG to OM is patent.  Dist RCA lesion is 50% stenosed.  RPDA lesion is 40% stenosed.  Ost RCA to Prox RCA lesion is 75% stenosed. Origin to Prox Graft lesion is 70% stenosed.  A drug-eluting stent was successfully placed using a STENT SYNERGY DES 4X16, postdilated to 4.5 mm.  Post intervention, there is a 0% residual stenosis.  The left ventricular systolic function is normal.  LV end diastolic pressure is normal.  The left ventricular ejection  fraction is 55-65% by visual estimate.  There is no aortic valve stenosis.   Continue dual antiplatelet therapy for at least one year and, clopidogrel likely indefinitely.   Aggressive secondary prevention.      Event monitor 06/13/2018 normal sinus rhythm with occasional sinus bradycardia and sinus tachycardia.  No atrial fibrillation or pathologic arrhythmias.    ASSESSMENT:    1. CAD of autologous artery bypass graft without angina   2. Preoperative clearance   3. Ectopic atrial tachycardia (HCC)   4. Ischemic cardiomyopathy   5. Essential hypertension   6. Hyperlipidemia LDL goal <70      PLAN:  In order of problems listed above:  Preoperative clearance for undergoing cervical neck surgery by Dr. Ashok Pall with [facility] neurosurgery and spine.  Patient has long history of CAD status post CABG in 2017, DES to the proximal RCA 10/05/2017 on Plavix and aspirin with chronic chest pain felt secondary to thoracic spine disease and referred pain.  Patient was asked to hold his Plavix and aspirin for 10 days by anesthesia according to him so he stopped it Monday.  Surgery was rescheduled from 10/06/2020 10/13/2018.  Will discuss with Dr. Irish Lack that he is okay with him being off Plavix and aspirin that long.  He has a DES in his RCA that was placed exactly 1 year ago.  He has chronic chest pain but it has been stable.  He just had a 2D echo 09/28/2018 that showed normal LV function and no wall motion abnormalities.  According to revised cardiac risk index the patient is at high risk of cardiac event.  He does have a new stent 10/05/2018.  Functional capacity is high.  Will discuss with Dr. Irish Lack tomorrow concerning clearance and how long he can be off Plavix and aspirin. According to the Revised Cardiac Risk Index (RCRI), his Perioperative Risk of Major Cardiac Event is (%): 11  His Functional Capacity in METs is: 8.23 according to the [facility] Activity Status Index (DASI).    CAD status  post CABG in 2017 and DES to the proximal RCA 10/05/2017 chronic chest pain on Plavix and aspirin  Ectopic atrial tachycardia on beta-blocker  History of ischemic cardiomyopathy ejection fraction normal on echo 09/28/2018  Essential hypertension blood pressure controlled  Hyperlipidemia on Lipitor LDL 43 11/08/2017  Medication Adjustments/Labs and Tests Ordered: Current medicines are reviewed at length with the patient today.  Concerns regarding medicines are outlined above.  Medication changes, Labs and Tests ordered today are listed in the Patient Instructions below. Patient Instructions  Medication Instructions:  Your physician recommends that you continue on your current medications as directed. Please refer to the Current Medication list given to you today.  If you need a refill on your cardiac medications before your next appointment, please call your pharmacy.   Lab work: None Ordered  If you have labs (blood work) drawn today and your tests are completely normal, you will receive your results only by: Marland Kitchen MyChart Message (if you have MyChart) OR . A paper copy in  the mail If you have any lab test that is abnormal or we need to change your treatment, we will call you to review the results.  Testing/Procedures: None ordered  Follow-Up: At [facility], you and your health needs are our priority.  As part of our continuing mission to provide you with exceptional heart care, we have created designated Provider Care Teams.  These Care Teams include your primary Cardiologist (physician) and Advanced Practice Providers (APPs -  Physician Assistants and Nurse Practitioners) who all work together to provide you with the care you need, when you need it. . You will need a follow up appointment in 6 months  Please call our office 2 months in advance to schedule this appointment.  You may see Casandra Doffing, MD or one of the following Advanced Practice Providers on your designated Care Team:    . Lyda Jester, PA-C . Dayna Dunn, PA-C . Ermalinda Barrios, PA-C  Any Other Special Instructions Will Be Listed Below (If Applicable).  WE WILL CALL YOU TOMORROW REGARDING YOUR SURGICAL CLEARANCE     Signed, Ermalinda Barrios, PA-C  10/04/2018 1:03 PM    [facility] [place], [place], [place]  [address] Phone: [phone]; Fax: [phone]

## 2018-10-04 NOTE — Patient Instructions (Signed)
Medication Instructions:  Your physician recommends that you continue on your current medications as directed. Please refer to the Current Medication list given to you today.  If you need a refill on your cardiac medications before your next appointment, please call your pharmacy.   Lab work: None Ordered  If you have labs (blood work) drawn today and your tests are completely normal, you will receive your results only by: Marland Kitchen MyChart Message (if you have MyChart) OR . A paper copy in the mail If you have any lab test that is abnormal or we need to change your treatment, we will call you to review the results.  Testing/Procedures: None ordered  Follow-Up: At [facility], you and your health needs are our priority.  As part of our continuing mission to provide you with exceptional heart care, we have created designated Provider Care Teams.  These Care Teams include your primary Cardiologist (physician) and Advanced Practice Providers (APPs -  Physician Assistants and Nurse Practitioners) who all work together to provide you with the care you need, when you need it. . You will need a follow up appointment in 6 months  Please call our office 2 months in advance to schedule this appointment.  You may see Casandra Doffing, MD or one of the following Advanced Practice Providers on your designated Care Team:   . Lyda Jester, PA-C . Dayna Dunn, PA-C . Ermalinda Barrios, PA-C  Any Other Special Instructions Will Be Listed Below (If Applicable).  WE WILL CALL YOU TOMORROW REGARDING YOUR SURGICAL CLEARANCE

## 2018-10-05 NOTE — Telephone Encounter (Signed)
Returned call to patient. Made patient aware that Dr. Irish Lack reviewed his chart from when Ermalinda Barrios, PA saw him yesterday. Made patient aware that per Dr. Irish Lack, he is okay to be off of plavix and ASA for the 10 days prior to his procedure. Patient will need to resume ASAP after procedure. No further testing needed prior to his surgery next Friday 10/13/18. Patient verbalized understanding and thanked me for the call. Will fax clearance via Epic.

## 2018-10-05 NOTE — Telephone Encounter (Signed)
Patient called today has questions about his clearance.

## 2018-10-05 NOTE — Telephone Encounter (Signed)
Pt called with question.  Please check with pt

## 2018-10-09 ENCOUNTER — Telehealth: Payer: Self-pay | Admitting: Interventional Cardiology

## 2018-10-09 NOTE — Telephone Encounter (Signed)
BP can be managed during the surgery by anesthesia.  WOuld like to see BP < 150/90 at least.

## 2018-10-09 NOTE — Telephone Encounter (Signed)
Pt c/o BP issue: STAT if pt c/o blurred vision, one-sided weakness or slurred speech  1. What are your last 5 BP readings? 176/110 and pulse rate 130,170/98 and pulse rate 124- this is when he walking around  2. Are you having any other symptoms (ex. Dizziness, headache, blurred vision, passed out)?                      3. What is your BP issue?  High Blood Pressure and high pulse rate

## 2018-10-09 NOTE — Telephone Encounter (Signed)
Endorsed to the pt Dr Hassell Done recommendations, and ideally he would like his BP to be <150/90.  Pt verbalized understanding and agrees with this plan.  Pt states when he is resting and not exerting, his V/S greatly improve, due to his pain level being under control.  Pt gracious for all the assistance provided.

## 2018-10-09 NOTE — Telephone Encounter (Signed)
Spoke with the patient, he stated he has been in significant pain due neck and back pain. He stated the pain medication is not helping. I advised the patient to call the surgeon's office and see if he could be prescribed more medication. He stated his pressure this morning was 170/98 and HR 124. He was wondering if his elevated blood pressure would delay the surgery. He wanted to know what blood pressure would be fine to continue with the surgery. Sending to Dr. Irish Lack for recommendations.

## 2018-10-12 NOTE — Telephone Encounter (Signed)
  Patient called because he was told this morning by the surgical center that he was high risk for his surgery. He is questioning why he is high risk and wants to know if there is something wrong that he doesn't know about.

## 2018-10-12 NOTE — Telephone Encounter (Signed)
The chart has been reviewed. He has had history of CVA, coronary bypass, stent to the RCA, a prior heart attack. This is all leading to a high risk score by calculation. This does not mean that he cannot undergo surgery,  He has already been instructed about timing of holding Plavix and ASA, reviewed by [place].  Nothing new that he has to worry about.

## 2018-10-16 ENCOUNTER — Encounter (HOSPITAL_COMMUNITY): Payer: Self-pay

## 2018-10-16 ENCOUNTER — Other Ambulatory Visit: Payer: Self-pay | Admitting: Neurosurgery

## 2018-10-16 ENCOUNTER — Telehealth: Payer: Self-pay | Admitting: Interventional Cardiology

## 2018-10-16 NOTE — Telephone Encounter (Signed)
New Message   Patient is calling to f/u on calls that he had last week in reference to his BP and a surgery he was to have. Patient did not have the surgery but he feels like he need to give and explanation about his frustration last week and to apologize. He only wants to speak with [place].

## 2018-10-16 NOTE — Telephone Encounter (Signed)
Returned call to patient. Patient states that his back surgery was cancelled and he is suppose to meet with a new surgeon this week. He states that he is not sure when his surgery will be rescheduled. Patient states that he wants to continue to hold his ASA and plavix just in case they require him to be off of it for 10 days like before and that way he doesn't have to wait that long to have his surgery. Made patient aware that he has already been off of his ASA and Plavix for about 2 weeks which is longer than we recommended before. Instructed the patient to restart his ASA and plavix especially since we do not know when his surgery is going to be rescheduled. Patient was very apprehensive about restarting but finally agreed. Will forward to JV to make aware.

## 2018-10-17 ENCOUNTER — Observation Stay (HOSPITAL_COMMUNITY)
Admission: RE | Admit: 2018-10-17 | Discharge: 2018-10-18 | DRG: 473 | Disposition: A | Discharge status: Home / Self Care | Payer: BC Managed Care – PPO | Attending: Neurosurgery | Admitting: Neurosurgery

## 2018-10-17 ENCOUNTER — Inpatient Hospital Stay (HOSPITAL_COMMUNITY): Payer: BC Managed Care – PPO | Admitting: Certified Registered Nurse Anesthetist

## 2018-10-17 ENCOUNTER — Inpatient Hospital Stay (HOSPITAL_COMMUNITY): Payer: BC Managed Care – PPO

## 2018-10-17 ENCOUNTER — Other Ambulatory Visit: Payer: Self-pay

## 2018-10-17 ENCOUNTER — Encounter (HOSPITAL_COMMUNITY)
Admission: RE | Disposition: A | Discharge status: Home / Self Care | Payer: Self-pay | Source: Home / Self Care | Attending: Neurosurgery

## 2018-10-17 ENCOUNTER — Encounter (HOSPITAL_COMMUNITY): Payer: Self-pay

## 2018-10-17 DIAGNOSIS — Z79899 Other long term (current) drug therapy: Secondary | ICD-10-CM | POA: Insufficient documentation

## 2018-10-17 DIAGNOSIS — I2510 Atherosclerotic heart disease of native coronary artery without angina pectoris: Secondary | ICD-10-CM | POA: Diagnosis not present

## 2018-10-17 DIAGNOSIS — Z885 Allergy status to narcotic agent status: Secondary | ICD-10-CM | POA: Insufficient documentation

## 2018-10-17 DIAGNOSIS — Z951 Presence of aortocoronary bypass graft: Secondary | ICD-10-CM | POA: Diagnosis not present

## 2018-10-17 DIAGNOSIS — F419 Anxiety disorder, unspecified: Secondary | ICD-10-CM | POA: Diagnosis not present

## 2018-10-17 DIAGNOSIS — M4803 Spinal stenosis, cervicothoracic region: Secondary | ICD-10-CM | POA: Diagnosis not present

## 2018-10-17 DIAGNOSIS — I252 Old myocardial infarction: Secondary | ICD-10-CM | POA: Diagnosis not present

## 2018-10-17 DIAGNOSIS — E7800 Pure hypercholesterolemia, unspecified: Secondary | ICD-10-CM | POA: Insufficient documentation

## 2018-10-17 DIAGNOSIS — I10 Essential (primary) hypertension: Secondary | ICD-10-CM | POA: Diagnosis not present

## 2018-10-17 DIAGNOSIS — I255 Ischemic cardiomyopathy: Secondary | ICD-10-CM | POA: Insufficient documentation

## 2018-10-17 DIAGNOSIS — Z7902 Long term (current) use of antithrombotics/antiplatelets: Secondary | ICD-10-CM | POA: Insufficient documentation

## 2018-10-17 DIAGNOSIS — Z87891 Personal history of nicotine dependence: Secondary | ICD-10-CM | POA: Insufficient documentation

## 2018-10-17 DIAGNOSIS — K219 Gastro-esophageal reflux disease without esophagitis: Secondary | ICD-10-CM | POA: Diagnosis not present

## 2018-10-17 DIAGNOSIS — F329 Major depressive disorder, single episode, unspecified: Secondary | ICD-10-CM | POA: Diagnosis not present

## 2018-10-17 DIAGNOSIS — M5013 Cervical disc disorder with radiculopathy, cervicothoracic region: Secondary | ICD-10-CM | POA: Diagnosis present

## 2018-10-17 DIAGNOSIS — Z7982 Long term (current) use of aspirin: Secondary | ICD-10-CM | POA: Insufficient documentation

## 2018-10-17 DIAGNOSIS — Z955 Presence of coronary angioplasty implant and graft: Secondary | ICD-10-CM | POA: Diagnosis not present

## 2018-10-17 DIAGNOSIS — Z8673 Personal history of transient ischemic attack (TIA), and cerebral infarction without residual deficits: Secondary | ICD-10-CM | POA: Diagnosis not present

## 2018-10-17 DIAGNOSIS — M5412 Radiculopathy, cervical region: Secondary | ICD-10-CM | POA: Diagnosis present

## 2018-10-17 DIAGNOSIS — Z888 Allergy status to other drugs, medicaments and biological substances status: Secondary | ICD-10-CM | POA: Insufficient documentation

## 2018-10-17 DIAGNOSIS — Z419 Encounter for procedure for purposes other than remedying health state, unspecified: Secondary | ICD-10-CM

## 2018-10-17 HISTORY — DX: Personal history of urinary calculi: Z87.442

## 2018-10-17 HISTORY — DX: Unspecified osteoarthritis, unspecified site: M19.90

## 2018-10-17 HISTORY — PX: ANTERIOR CERVICAL DECOMP/DISCECTOMY FUSION: SHX1161

## 2018-10-17 LAB — COMPREHENSIVE METABOLIC PANEL
ALT: 27 U/L (ref 0–44)
AST: 25 U/L (ref 15–41)
Albumin: 4.5 g/dL (ref 3.5–5.0)
Alkaline Phosphatase: 65 U/L (ref 38–126)
Anion gap: 14 (ref 5–15)
BUN: 21 mg/dL (ref 8–23)
CO2: 20 mmol/L — ABNORMAL LOW (ref 22–32)
Calcium: 9.9 mg/dL (ref 8.9–10.3)
Chloride: 105 mmol/L (ref 98–111)
Creatinine, Ser: 1.01 mg/dL (ref 0.61–1.24)
GFR calc Af Amer: >60 mL/min (ref >60)
GFR calc non Af Amer: >60 mL/min (ref >60)
Glucose, Bld: 106 mg/dL — ABNORMAL HIGH (ref 70–99)
Potassium: 4.6 mmol/L (ref 3.5–5.1)
Sodium: 139 mmol/L (ref 135–145)
Total Bilirubin: 0.8 mg/dL (ref 0.3–1.2)
Total Protein: 7.6 g/dL (ref 6.5–8.1)

## 2018-10-17 LAB — CBC
HCT: 45.2 % (ref 39.0–52.0)
Hemoglobin: 14.6 g/dL (ref 13.0–17.0)
MCH: 29.4 pg (ref 26.0–34.0)
MCHC: 32.3 g/dL (ref 30.0–36.0)
MCV: 91.1 fL (ref 80.0–100.0)
Platelets: 240 10*3/uL (ref 150–400)
RBC: 4.96 MIL/uL (ref 4.22–5.81)
RDW: 12.3 % (ref 11.5–15.5)
WBC: 9.3 10*3/uL (ref 4.0–10.5)
nRBC: 0 % (ref 0.0–0.2)

## 2018-10-17 SURGERY — ANTERIOR CERVICAL DECOMPRESSION/DISCECTOMY FUSION 1 LEVEL
Anesthesia: General

## 2018-10-17 MED ORDER — THROMBIN 5000 UNITS EX SOLR
OROMUCOSAL | Status: DC | PRN
  Administered 2018-10-17: 12:00:00 via TOPICAL

## 2018-10-17 MED ORDER — GABAPENTIN 300 MG PO CAPS
300.0000 mg | ORAL_CAPSULE | Freq: Three times a day (TID) | ORAL | Status: DC
  Administered 2018-10-17 – 2018-10-18 (×3): 300 mg via ORAL
  Filled 2018-10-17 (×3): qty 1

## 2018-10-17 MED ORDER — ROCURONIUM BROMIDE 50 MG/5ML IV SOSY
PREFILLED_SYRINGE | INTRAVENOUS | Status: DC | PRN
  Administered 2018-10-17: 20 mg via INTRAVENOUS
  Administered 2018-10-17: 50 mg via INTRAVENOUS

## 2018-10-17 MED ORDER — ROCURONIUM BROMIDE 50 MG/5ML IV SOSY
PREFILLED_SYRINGE | INTRAVENOUS | Status: AC
  Filled 2018-10-17: qty 5

## 2018-10-17 MED ORDER — MENTHOL 3 MG MT LOZG
1.0000 | LOZENGE | OROMUCOSAL | Status: DC | PRN
  Filled 2018-10-17: qty 9

## 2018-10-17 MED ORDER — LIDOCAINE-EPINEPHRINE 1 %-1:100000 IJ SOLN
INTRAMUSCULAR | Status: DC | PRN
  Administered 2018-10-17: 4 mL

## 2018-10-17 MED ORDER — BISACODYL 10 MG RE SUPP: 10.0000 mg | Freq: Every day | RECTAL | Status: DC | PRN

## 2018-10-17 MED ORDER — SUGAMMADEX SODIUM 200 MG/2ML IV SOLN
INTRAVENOUS | Status: DC | PRN
  Administered 2018-10-17: 200 mg via INTRAVENOUS

## 2018-10-17 MED ORDER — METOPROLOL SUCCINATE ER 50 MG PO TB24
50.0000 mg | ORAL_TABLET | Freq: Every day | ORAL | Status: DC
  Administered 2018-10-18: 50 mg via ORAL
  Filled 2018-10-17: qty 1

## 2018-10-17 MED ORDER — BUPIVACAINE HCL 0.5 % IJ SOLN
INTRAMUSCULAR | Status: DC | PRN
  Administered 2018-10-17: 4 mL

## 2018-10-17 MED ORDER — NITROGLYCERIN 0.4 MG SL SUBL: 0.4000 mg | SUBLINGUAL_TABLET | SUBLINGUAL | Status: DC | PRN

## 2018-10-17 MED ORDER — ZOLPIDEM TARTRATE 5 MG PO TABS
5.0000 mg | ORAL_TABLET | Freq: Every evening | ORAL | Status: DC | PRN
  Administered 2018-10-18: 5 mg via ORAL
  Filled 2018-10-17: qty 1

## 2018-10-17 MED ORDER — SUCRALFATE 1 G PO TABS
1.0000 g | ORAL_TABLET | Freq: Three times a day (TID) | ORAL | Status: DC
  Administered 2018-10-17 – 2018-10-18 (×3): 1 g via ORAL
  Filled 2018-10-17 (×5): qty 1

## 2018-10-17 MED ORDER — FENTANYL CITRATE (PF) 250 MCG/5ML IJ SOLN
INTRAMUSCULAR | Status: DC | PRN
  Administered 2018-10-17 (×5): 50 ug via INTRAVENOUS

## 2018-10-17 MED ORDER — FAMOTIDINE 20 MG PO TABS
20.0000 mg | ORAL_TABLET | Freq: Two times a day (BID) | ORAL | Status: DC | PRN
  Administered 2018-10-17: 20 mg via ORAL
  Filled 2018-10-17: qty 1

## 2018-10-17 MED ORDER — ACETAMINOPHEN 500 MG PO TABS
1000.0000 mg | ORAL_TABLET | Freq: Four times a day (QID) | ORAL | Status: AC
  Administered 2018-10-17 – 2018-10-18 (×4): 1000 mg via ORAL
  Filled 2018-10-17 (×4): qty 2

## 2018-10-17 MED ORDER — SODIUM CHLORIDE 0.9% FLUSH: 3.0000 mL | INTRAVENOUS | Status: DC | PRN

## 2018-10-17 MED ORDER — PHENOL 1.4 % MT LIQD
1.0000 | OROMUCOSAL | Status: DC | PRN
  Administered 2018-10-17: 1 via OROMUCOSAL
  Filled 2018-10-17: qty 177

## 2018-10-17 MED ORDER — CEFAZOLIN SODIUM-DEXTROSE 2-3 GM-%(50ML) IV SOLR
INTRAVENOUS | Status: DC | PRN
  Administered 2018-10-17: 2 g via INTRAVENOUS

## 2018-10-17 MED ORDER — DEXAMETHASONE SODIUM PHOSPHATE 10 MG/ML IJ SOLN
INTRAMUSCULAR | Status: AC
  Filled 2018-10-17: qty 1

## 2018-10-17 MED ORDER — LISINOPRIL 5 MG PO TABS
5.0000 mg | ORAL_TABLET | Freq: Every day | ORAL | Status: DC
  Administered 2018-10-17: 5 mg via ORAL
  Filled 2018-10-17 (×2): qty 1

## 2018-10-17 MED ORDER — ACETAMINOPHEN 325 MG PO TABS: 650.0000 mg | ORAL_TABLET | ORAL | Status: DC | PRN

## 2018-10-17 MED ORDER — METHOCARBAMOL 500 MG PO TABS
500.0000 mg | ORAL_TABLET | Freq: Four times a day (QID) | ORAL | Status: DC | PRN
  Administered 2018-10-17 – 2018-10-18 (×3): 500 mg via ORAL
  Filled 2018-10-17 (×3): qty 1

## 2018-10-17 MED ORDER — FENTANYL CITRATE (PF) 100 MCG/2ML IJ SOLN
INTRAMUSCULAR | Status: AC
  Filled 2018-10-17: qty 2

## 2018-10-17 MED ORDER — ACETAMINOPHEN 500 MG PO TABS: 1000.0000 mg | ORAL_TABLET | Freq: Four times a day (QID) | ORAL | Status: DC | PRN

## 2018-10-17 MED ORDER — ONDANSETRON HCL 4 MG/2ML IJ SOLN
INTRAMUSCULAR | Status: DC | PRN
  Administered 2018-10-17: 4 mg via INTRAVENOUS

## 2018-10-17 MED ORDER — FENTANYL CITRATE (PF) 100 MCG/2ML IJ SOLN
INTRAMUSCULAR | Status: AC
  Administered 2018-10-17: 50 ug via INTRAVENOUS
  Filled 2018-10-17: qty 2

## 2018-10-17 MED ORDER — EPHEDRINE SULFATE-NACL 50-0.9 MG/10ML-% IV SOSY
PREFILLED_SYRINGE | INTRAVENOUS | Status: DC | PRN
  Administered 2018-10-17 (×2): 10 mg via INTRAVENOUS

## 2018-10-17 MED ORDER — OXYCODONE HCL 5 MG PO TABS
5.0000 mg | ORAL_TABLET | ORAL | Status: DC | PRN
  Administered 2018-10-17 – 2018-10-18 (×7): 10 mg via ORAL
  Filled 2018-10-17 (×6): qty 2

## 2018-10-17 MED ORDER — AMLODIPINE BESYLATE 10 MG PO TABS
10.0000 mg | ORAL_TABLET | Freq: Every day | ORAL | Status: DC
  Administered 2018-10-17 – 2018-10-18 (×2): 10 mg via ORAL
  Filled 2018-10-17: qty 1
  Filled 2018-10-17: qty 2
  Filled 2018-10-17: qty 1
  Filled 2018-10-17: qty 2

## 2018-10-17 MED ORDER — 0.9 % SODIUM CHLORIDE (POUR BTL) OPTIME
TOPICAL | Status: DC | PRN
  Administered 2018-10-17: 1000 mL

## 2018-10-17 MED ORDER — PROMETHAZINE HCL 25 MG/ML IJ SOLN: 6.2500 mg | INTRAMUSCULAR | Status: DC | PRN

## 2018-10-17 MED ORDER — PROPOFOL 10 MG/ML IV BOLUS
INTRAVENOUS | Status: AC
  Filled 2018-10-17: qty 20

## 2018-10-17 MED ORDER — HYDROCODONE-ACETAMINOPHEN 5-325 MG PO TABS: 1.0000 | ORAL_TABLET | ORAL | Status: DC | PRN

## 2018-10-17 MED ORDER — LIDOCAINE-EPINEPHRINE 1 %-1:100000 IJ SOLN
INTRAMUSCULAR | Status: AC
  Filled 2018-10-17: qty 1

## 2018-10-17 MED ORDER — EPHEDRINE 5 MG/ML INJ
INTRAVENOUS | Status: AC
  Filled 2018-10-17: qty 10

## 2018-10-17 MED ORDER — LACTATED RINGERS IV SOLN
INTRAVENOUS | Status: DC
  Administered 2018-10-17: 10:00:00 via INTRAVENOUS

## 2018-10-17 MED ORDER — DEXAMETHASONE SODIUM PHOSPHATE 10 MG/ML IJ SOLN
INTRAMUSCULAR | Status: DC | PRN
  Administered 2018-10-17: 10 mg via INTRAVENOUS

## 2018-10-17 MED ORDER — FENTANYL CITRATE (PF) 250 MCG/5ML IJ SOLN
INTRAMUSCULAR | Status: AC
  Filled 2018-10-17: qty 5

## 2018-10-17 MED ORDER — HYDROMORPHONE HCL 1 MG/ML IJ SOLN
0.5000 mg | INTRAMUSCULAR | Status: DC | PRN
  Administered 2018-10-18: 0.5 mg via INTRAVENOUS
  Filled 2018-10-17: qty 0.5

## 2018-10-17 MED ORDER — FENTANYL CITRATE (PF) 100 MCG/2ML IJ SOLN
25.0000 ug | INTRAMUSCULAR | Status: DC | PRN
  Administered 2018-10-17 (×3): 50 ug via INTRAVENOUS

## 2018-10-17 MED ORDER — CEFAZOLIN SODIUM-DEXTROSE 2-4 GM/100ML-% IV SOLN
2.0000 g | Freq: Three times a day (TID) | INTRAVENOUS | Status: AC
  Administered 2018-10-17 – 2018-10-18 (×2): 2 g via INTRAVENOUS
  Filled 2018-10-17 (×2): qty 100

## 2018-10-17 MED ORDER — MEPERIDINE HCL 50 MG/ML IJ SOLN: 6.2500 mg | INTRAMUSCULAR | Status: DC | PRN

## 2018-10-17 MED ORDER — METHOCARBAMOL 1000 MG/10ML IJ SOLN
500.0000 mg | Freq: Four times a day (QID) | INTRAVENOUS | Status: DC | PRN
  Filled 2018-10-17: qty 5

## 2018-10-17 MED ORDER — ATORVASTATIN CALCIUM 80 MG PO TABS
80.0000 mg | ORAL_TABLET | Freq: Every day | ORAL | Status: DC
  Administered 2018-10-17: 80 mg via ORAL
  Filled 2018-10-17: qty 1

## 2018-10-17 MED ORDER — CEFAZOLIN SODIUM-DEXTROSE 2-4 GM/100ML-% IV SOLN
INTRAVENOUS | Status: AC
  Filled 2018-10-17: qty 100

## 2018-10-17 MED ORDER — KETOROLAC TROMETHAMINE 30 MG/ML IJ SOLN
30.0000 mg | Freq: Once | INTRAMUSCULAR | Status: AC | PRN
  Administered 2018-10-17: 30 mg via INTRAVENOUS

## 2018-10-17 MED ORDER — VILAZODONE HCL 10 MG PO TABS: 10.0000 mg | ORAL_TABLET | Freq: Every day | ORAL | Status: DC

## 2018-10-17 MED ORDER — HEMOSTATIC AGENTS (NO CHARGE) OPTIME
TOPICAL | Status: DC | PRN
  Administered 2018-10-17: 1 via TOPICAL

## 2018-10-17 MED ORDER — KETOROLAC TROMETHAMINE 30 MG/ML IJ SOLN
INTRAMUSCULAR | Status: AC
  Filled 2018-10-17: qty 1

## 2018-10-17 MED ORDER — ADULT MULTIVITAMIN W/MINERALS CH: 1.0000 | ORAL_TABLET | Freq: Every day | ORAL | Status: DC

## 2018-10-17 MED ORDER — PANTOPRAZOLE SODIUM 40 MG PO TBEC
40.0000 mg | DELAYED_RELEASE_TABLET | Freq: Every day | ORAL | Status: DC
  Administered 2018-10-18: 40 mg via ORAL
  Filled 2018-10-17: qty 1

## 2018-10-17 MED ORDER — THROMBIN 5000 UNITS EX SOLR
CUTANEOUS | Status: DC | PRN
  Administered 2018-10-17 (×2): 5000 [IU] via TOPICAL

## 2018-10-17 MED ORDER — SENNOSIDES-DOCUSATE SODIUM 8.6-50 MG PO TABS: 1.0000 | ORAL_TABLET | Freq: Every evening | ORAL | Status: DC | PRN

## 2018-10-17 MED ORDER — ACETAMINOPHEN 650 MG RE SUPP: 650.0000 mg | RECTAL | Status: DC | PRN

## 2018-10-17 MED ORDER — METHOCARBAMOL 500 MG PO TABS
ORAL_TABLET | ORAL | Status: AC
  Filled 2018-10-17: qty 1

## 2018-10-17 MED ORDER — PHENYLEPHRINE 40 MCG/ML (10ML) SYRINGE FOR IV PUSH (FOR BLOOD PRESSURE SUPPORT)
PREFILLED_SYRINGE | INTRAVENOUS | Status: AC
  Filled 2018-10-17: qty 10

## 2018-10-17 MED ORDER — LIDOCAINE 2% (20 MG/ML) 5 ML SYRINGE
INTRAMUSCULAR | Status: DC | PRN
  Administered 2018-10-17: 100 mg via INTRAVENOUS

## 2018-10-17 MED ORDER — ONDANSETRON HCL 4 MG PO TABS: 4.0000 mg | ORAL_TABLET | Freq: Four times a day (QID) | ORAL | Status: DC | PRN

## 2018-10-17 MED ORDER — SODIUM CHLORIDE 0.9 % IV SOLN: INTRAVENOUS | Status: DC

## 2018-10-17 MED ORDER — OXYCODONE HCL 5 MG PO TABS
ORAL_TABLET | ORAL | Status: AC
  Filled 2018-10-17: qty 2

## 2018-10-17 MED ORDER — SODIUM CHLORIDE 0.9% FLUSH
3.0000 mL | Freq: Two times a day (BID) | INTRAVENOUS | Status: DC
  Administered 2018-10-17: 3 mL via INTRAVENOUS

## 2018-10-17 MED ORDER — PHENYLEPHRINE 40 MCG/ML (10ML) SYRINGE FOR IV PUSH (FOR BLOOD PRESSURE SUPPORT)
PREFILLED_SYRINGE | INTRAVENOUS | Status: DC | PRN
  Administered 2018-10-17: 80 ug via INTRAVENOUS
  Administered 2018-10-17: 120 ug via INTRAVENOUS
  Administered 2018-10-17: 80 ug via INTRAVENOUS
  Administered 2018-10-17: 120 ug via INTRAVENOUS

## 2018-10-17 MED ORDER — SODIUM CHLORIDE 0.9 % IV SOLN
INTRAVENOUS | Status: DC | PRN
  Administered 2018-10-17: 12:00:00

## 2018-10-17 MED ORDER — SODIUM CHLORIDE 0.9 % IV SOLN
INTRAVENOUS | Status: DC | PRN
  Administered 2018-10-17: 50 ug/min via INTRAVENOUS

## 2018-10-17 MED ORDER — FENTANYL CITRATE (PF) 100 MCG/2ML IJ SOLN
50.0000 ug | INTRAMUSCULAR | Status: DC | PRN
  Administered 2018-10-17: 50 ug via INTRAVENOUS

## 2018-10-17 MED ORDER — LIDOCAINE 2% (20 MG/ML) 5 ML SYRINGE
INTRAMUSCULAR | Status: AC
  Filled 2018-10-17: qty 5

## 2018-10-17 MED ORDER — FENTANYL CITRATE (PF) 100 MCG/2ML IJ SOLN: 50.0000 ug | INTRAMUSCULAR | Status: DC | PRN

## 2018-10-17 MED ORDER — ONDANSETRON HCL 4 MG/2ML IJ SOLN
INTRAMUSCULAR | Status: AC
  Filled 2018-10-17: qty 2

## 2018-10-17 MED ORDER — DOCUSATE SODIUM 100 MG PO CAPS
100.0000 mg | ORAL_CAPSULE | Freq: Two times a day (BID) | ORAL | Status: DC
  Administered 2018-10-17 – 2018-10-18 (×2): 100 mg via ORAL
  Filled 2018-10-17 (×2): qty 1

## 2018-10-17 MED ORDER — BUPIVACAINE HCL (PF) 0.5 % IJ SOLN
INTRAMUSCULAR | Status: AC
  Filled 2018-10-17: qty 30

## 2018-10-17 MED ORDER — MIDAZOLAM HCL 5 MG/5ML IJ SOLN
INTRAMUSCULAR | Status: DC | PRN
  Administered 2018-10-17: 2 mg via INTRAVENOUS

## 2018-10-17 MED ORDER — RANOLAZINE ER 500 MG PO TB12
500.0000 mg | ORAL_TABLET | Freq: Every day | ORAL | Status: DC
  Administered 2018-10-17 – 2018-10-18 (×2): 500 mg via ORAL
  Filled 2018-10-17 (×2): qty 1

## 2018-10-17 MED ORDER — PROPOFOL 10 MG/ML IV BOLUS
INTRAVENOUS | Status: DC | PRN
  Administered 2018-10-17: 150 mg via INTRAVENOUS

## 2018-10-17 MED ORDER — SENNA 8.6 MG PO TABS
1.0000 | ORAL_TABLET | Freq: Two times a day (BID) | ORAL | Status: DC
  Administered 2018-10-17 – 2018-10-18 (×2): 8.6 mg via ORAL
  Filled 2018-10-17 (×2): qty 1

## 2018-10-17 MED ORDER — MIDAZOLAM HCL 2 MG/2ML IJ SOLN
INTRAMUSCULAR | Status: AC
  Filled 2018-10-17: qty 2

## 2018-10-17 MED ORDER — ONDANSETRON HCL 4 MG/2ML IJ SOLN: 4.0000 mg | Freq: Four times a day (QID) | INTRAMUSCULAR | Status: DC | PRN

## 2018-10-17 MED ORDER — LACTATED RINGERS IV SOLN
INTRAVENOUS | Status: DC | PRN
  Administered 2018-10-17 (×2): via INTRAVENOUS

## 2018-10-17 MED ORDER — FLEET ENEMA 7-19 GM/118ML RE ENEM: 1.0000 | ENEMA | Freq: Once | RECTAL | Status: DC | PRN

## 2018-10-17 MED ORDER — THROMBIN 5000 UNITS EX SOLR
CUTANEOUS | Status: AC
  Filled 2018-10-17: qty 15000

## 2018-10-17 MED ORDER — CHLORDIAZEPOXIDE HCL 10 MG PO CAPS: 10.0000 mg | ORAL_CAPSULE | Freq: Every evening | ORAL | Status: DC | PRN

## 2018-10-17 SURGICAL SUPPLY — 62 items
BAG DECANTER FOR FLEXI CONT (MISCELLANEOUS) ×2 IMPLANT
BENZOIN TINCTURE PRP APPL 2/3 (GAUZE/BANDAGES/DRESSINGS) IMPLANT
BLADE CLIPPER SURG (BLADE) ×2 IMPLANT
BLADE SURG 11 STRL SS (BLADE) ×2 IMPLANT
BLADE ULTRA TIP 2M (BLADE) IMPLANT
BUR MATCHSTICK NEURO 3.0 LAGG (BURR) ×2 IMPLANT
CAGE PEEK TI 6X16X14 12D (Cage) ×2 IMPLANT
CANISTER SUCT 3000ML PPV (MISCELLANEOUS) ×2 IMPLANT
CARTRIDGE OIL MAESTRO DRILL (MISCELLANEOUS) ×1 IMPLANT
COVER WAND RF STERILE (DRAPES) ×2 IMPLANT
DECANTER SPIKE VIAL GLASS SM (MISCELLANEOUS) ×2 IMPLANT
DERMABOND ADVANCED (GAUZE/BANDAGES/DRESSINGS) ×1
DERMABOND ADVANCED .7 DNX12 (GAUZE/BANDAGES/DRESSINGS) ×1 IMPLANT
DIFFUSER DRILL AIR PNEUMATIC (MISCELLANEOUS) ×2 IMPLANT
DRAPE C-ARM 42X72 X-RAY (DRAPES) ×4 IMPLANT
DRAPE HALF SHEET 40X57 (DRAPES) IMPLANT
DRAPE LAPAROTOMY 100X72 PEDS (DRAPES) ×2 IMPLANT
DRAPE MICROSCOPE LEICA (MISCELLANEOUS) ×2 IMPLANT
DRSG OPSITE 4X5.5 SM (GAUZE/BANDAGES/DRESSINGS) ×4 IMPLANT
DRSG OPSITE POSTOP 4X6 (GAUZE/BANDAGES/DRESSINGS) ×2 IMPLANT
DURAPREP 6ML APPLICATOR 50/CS (WOUND CARE) ×2 IMPLANT
ELECT COATED BLADE 2.86 ST (ELECTRODE) ×2 IMPLANT
ELECT REM PT RETURN 9FT ADLT (ELECTROSURGICAL) ×2
ELECTRODE REM PT RTRN 9FT ADLT (ELECTROSURGICAL) ×1 IMPLANT
GAUZE 4X4 16PLY RFD (DISPOSABLE) IMPLANT
GLOVE BIO SURGEON STRL SZ7.5 (GLOVE) ×2 IMPLANT
GLOVE BIOGEL PI IND STRL 7.5 (GLOVE) ×2 IMPLANT
GLOVE BIOGEL PI INDICATOR 7.5 (GLOVE) ×2
GLOVE ECLIPSE 7.0 STRL STRAW (GLOVE) ×2 IMPLANT
GLOVE EXAM NITRILE XL STR (GLOVE) IMPLANT
GLOVE INDICATOR 7.0 STRL GRN (GLOVE) ×2 IMPLANT
GLOVE INDICATOR 7.5 STRL GRN (GLOVE) ×2 IMPLANT
GLOVE SURG SS PI 7.0 STRL IVOR (GLOVE) ×4 IMPLANT
GOWN STRL REUS W/ TWL LRG LVL3 (GOWN DISPOSABLE) ×3 IMPLANT
GOWN STRL REUS W/ TWL XL LVL3 (GOWN DISPOSABLE) IMPLANT
GOWN STRL REUS W/TWL 2XL LVL3 (GOWN DISPOSABLE) IMPLANT
GOWN STRL REUS W/TWL LRG LVL3 (GOWN DISPOSABLE) ×3
GOWN STRL REUS W/TWL XL LVL3 (GOWN DISPOSABLE)
HEMOSTAT POWDER KIT SURGIFOAM (HEMOSTASIS) ×2 IMPLANT
INSERT GRAFT XPANSE 9.5X8.5X6 (Bone Implant) ×2 IMPLANT
KIT BASIN OR (CUSTOM PROCEDURE TRAY) ×2 IMPLANT
KIT TURNOVER KIT B (KITS) ×2 IMPLANT
NEEDLE HYPO 22GX1.5 SAFETY (NEEDLE) ×2 IMPLANT
NEEDLE SPNL 22GX3.5 QUINCKE BK (NEEDLE) ×4 IMPLANT
NS IRRIG 1000ML POUR BTL (IV SOLUTION) ×2 IMPLANT
OIL CARTRIDGE MAESTRO DRILL (MISCELLANEOUS) ×2
PACK LAMINECTOMY NEURO (CUSTOM PROCEDURE TRAY) ×2 IMPLANT
PAD ARMBOARD 7.5X6 YLW CONV (MISCELLANEOUS) ×6 IMPLANT
PLATE ZEVO 1LVL 19MM (Plate) ×2 IMPLANT
RUBBERBAND STERILE (MISCELLANEOUS) ×4 IMPLANT
SCREW 3.5 SELFDRILL 15MM VARI (Screw) ×8 IMPLANT
SPONGE INTESTINAL PEANUT (DISPOSABLE) ×4 IMPLANT
SPONGE SURGIFOAM ABS GEL 100 (HEMOSTASIS) ×2 IMPLANT
SPONGE SURGIFOAM ABS GEL SZ50 (HEMOSTASIS) ×2 IMPLANT
STAPLER VISISTAT 35W (STAPLE) ×2 IMPLANT
STRIP CLOSURE SKIN 1/2X4 (GAUZE/BANDAGES/DRESSINGS) ×2 IMPLANT
SUT VIC AB 3-0 SH 8-18 (SUTURE) ×2 IMPLANT
SUT VICRYL 3-0 RB1 18 ABS (SUTURE) ×4 IMPLANT
TAPE CLOTH 3X10 TAN LF (GAUZE/BANDAGES/DRESSINGS) ×2 IMPLANT
TOWEL GREEN STERILE (TOWEL DISPOSABLE) ×2 IMPLANT
TOWEL GREEN STERILE FF (TOWEL DISPOSABLE) ×2 IMPLANT
WATER STERILE IRR 1000ML POUR (IV SOLUTION) ×2 IMPLANT

## 2018-10-17 NOTE — Evaluation (Signed)
Physical Therapy Evaluation Patient Details Name: [name] MRN: [id number] DOB: [birth year] Today's Date: 10/17/2018   History of Present Illness  Patient is a 63 y/o male admitted for left neck and arm pain which started without identifiable inciting event. With his significant cardiac history he was evaluated int he ED and ACS was ruled out. His MRI did demonstrate left-sided C7-T1 disease  Clinical Impression  Patient presents with decreased independence with mobility due to pain and spinal precautions.  Able to demonstrate mobility with appropriate techniques.  Will have intermittent support from wife.  No current follow up needs unless L UE does not continue to normalize and would need outpatient referral.  Will sign off as close S to mod I with mobility and verbalized understanding of precautions with handout given.      Follow Up Recommendations No PT follow up    Equipment Recommendations  None recommended by PT    Recommendations for Other Services       Precautions / Restrictions Precautions Precautions: Cervical Required Braces or Orthoses: Cervical Brace Cervical Brace: Soft collar;For comfort Restrictions Weight Bearing Restrictions: No      Mobility  Bed Mobility Overal bed mobility: Needs Assistance Bed Mobility: Rolling;Sidelying to Sit Rolling: Supervision Sidelying to sit: Supervision       General bed mobility comments: cues for technique   Transfers Overall transfer level: Modified independent Equipment used: None                Ambulation/Gait Ambulation/Gait assistance: Supervision Gait Distance (Feet): 300 Feet Assistive device: None Gait Pattern/deviations: Step-through pattern;Decreased stride length;Wide base of support     General Gait Details: mild unsteadiness but no LOB  Stairs Stairs: Yes Stairs assistance: Min guard;Supervision Stair Management: Step to pattern;Forwards Number of Stairs: 10 General stair comments:  cues for technique, assist for safety  Wheelchair Mobility    Modified Rankin (Stroke Patients Only)       Balance Overall balance assessment: Modified Independent                                           Pertinent Vitals/Pain Pain Assessment: 0-10 Pain Score: 6  Pain Location: neck Pain Descriptors / Indicators: Operative site guarding Pain Intervention(s): Monitored during session;Repositioned;Patient requesting pain meds-RN notified    Home Living Family/patient expects to be discharged to:: Private residence Living Arrangements: Spouse/significant other Available Help at Discharge: Family;Available PRN/intermittently Type of Home: House Home Access: Stairs to enter Entrance Stairs-Rails: Psychiatric nurse of Steps: 6 Home Layout: Two level;Bed/bath upstairs Home Equipment: None      Prior Function Level of Independence: Independent         Comments: owns [employer]        Extremity/Trunk Assessment   Upper Extremity Assessment Upper Extremity Assessment: LUE deficits/detail LUE Deficits / Details: limited shoulder elevation to about 90 weak grip compared to R and still with numbness lateral aspect of hand    Lower Extremity Assessment Lower Extremity Assessment: RLE deficits/detail RLE Deficits / Details: AROM WFL, strength hip flexion 3/5, knee extension 4+/5, ankle DF 4/5 RLE Sensation: decreased light touch       Communication   Communication: No difficulties  Cognition Arousal/Alertness: Awake/alert Behavior During Therapy: WFL for tasks assessed/performed Overall Cognitive Status: Within Functional Limits for tasks assessed  General Comments      Exercises     Assessment/Plan    PT Assessment Patent does not need any further PT services  PT Problem List         PT Treatment Interventions      PT Goals  (Current goals can be found in the Care Plan section)  Acute Rehab PT Goals PT Goal Formulation: All assessment and education complete, DC therapy    Frequency     Barriers to discharge        Co-evaluation               AM-PAC PT "6 Clicks" Mobility  Outcome Measure Help needed turning from your back to your side while in a flat bed without using bedrails?: None Help needed moving from lying on your back to sitting on the side of a flat bed without using bedrails?: None Help needed moving to and from a bed to a chair (including a wheelchair)?: None Help needed standing up from a chair using your arms (e.g., wheelchair or bedside chair)?: None Help needed to walk in hospital room?: None Help needed climbing 3-5 steps with a railing? : A Little 6 Click Score: 23    End of Session Equipment Utilized During Treatment: Cervical collar Activity Tolerance: Patient tolerated treatment well Patient left: in bed;with call bell/phone within reach   PT Visit Diagnosis: Muscle weakness (generalized) (M62.81)    Time: 7588-3254 PT Time Calculation (min) (ACUTE ONLY): 24 min   Charges:   PT Evaluation $PT Eval Low Complexity: 1 Low PT Treatments $Gait Training: 8-22 mins        Magda Kiel, [place] [phone] 10/17/2018   [name] 10/17/2018, 5:56 PM

## 2018-10-17 NOTE — Progress Notes (Signed)
Orthopedic Tech Progress Note Patient Details:  [name] 06-19-1956 [id number]  Ortho Devices Type of Ortho Device: Soft collar Ortho Device/Splint Interventions: Application   Post Interventions Patient Tolerated: Well Instructions Provided: Care of device   [place] Pink 10/17/2018, 2:58 PM

## 2018-10-17 NOTE — Transfer of Care (Signed)
Immediate Anesthesia Transfer of Care Note  Patient: [name]  Procedure(s) Performed: Anterior Cervical Decompression Fusion - Cervical seven -Thoracic one (N/A )  Patient Location: PACU  Anesthesia Type:General  Level of Consciousness: awake, alert  and oriented  Airway & Oxygen Therapy: Patient Spontanous Breathing  Post-op Assessment: Report given to RN, Post -op Vital signs reviewed and stable and Patient moving all extremities X 4  Post vital signs: Reviewed and stable  Last Vitals:  Vitals Value Taken Time  BP 101/82 10/17/2018  2:19 PM  Temp    Pulse 78 10/17/2018  2:24 PM  Resp 10 10/17/2018  2:24 PM  SpO2 97 % 10/17/2018  2:24 PM  Vitals shown include unvalidated device data.  Last Pain:  Vitals:   10/17/18 1113  TempSrc:   PainSc: 4       Patients Stated Pain Goal: 3 (24/82/50 0370)  Complications: No apparent anesthesia complications

## 2018-10-17 NOTE — H&P (Signed)
Chief Complaint   Left neck and arm pain  History of Present Illness  [name] is a 63 y.o. male initially presenting with left neck and arm pain which started without identifiable inciting event. With his significant cardiac history he was evaluated int he ED and ACS was ruled out. His MRI did demonstrate left-sided C7-T1 disease. He describes pain in the left shoulder blade, arm, forearm and hand with subjective hand weakness. He denies right-sided symptoms.  Past Medical History   Past Medical History:  Diagnosis Date  . Anxiety   . Arthritis   . Basal cell carcinoma (BCC) of forehead   . CAD (coronary artery disease)    a. 10/2015 ant STEMI >> LHC with 3 v CAD; oLAD tx with POBA >> emergent CABG. b. Multiple evals since that time, early graft failure of SVG-RCA by cath 03/2016. c. 2/19 PCI/DES x1 to pRCA, normal EF.  . Carotid artery disease ([place])    a. 40-59% BICA 02/2018.  Marland Kitchen Depression   . Dyspnea   . Ectopic atrial tachycardia ([place])   . Esophageal reflux    eosinophil esophagitis  . Family history of adverse reaction to anesthesia    "sister has PONV" (06/21/2017)  . Former tobacco use   . Gout   . Hepatitis C    "treated and cured" (06/21/2017)  . High cholesterol   . History of kidney stones   . Hypertension   . Ischemic cardiomyopathy    a. EF 25-30% at intraop TEE 4/17  //  b. Limited Echo 5/17 - EF 45-50%, mild ant HK. c. EF 55-65% by cath 09/2017.  . Migraine    "3-4/yr" (06/21/2017)  . Myocardial infarction ([place]) 10/2015  . Palpitations   . Sinus bradycardia    a. HR dropping into 40s in 02/2016 -> BB reduced.  . Stroke ([place]) 10/2016   "small one; sometimes my memory/cognitive issues" (06/21/2017)  . Symptomatic hypotension    a. 02/2016 ER visit -> meds reduced.  . Syncope     Past Surgical History   Past Surgical History:  Procedure Laterality Date  . BASAL CELL CARCINOMA EXCISION     "forehead  . CARDIAC CATHETERIZATION N/A 11/28/2015   Procedure: Left Heart Cath and Coronary Angiography;  Surgeon: [name], MD;  Location: [place] [facility];  Service: Cardiovascular;  Laterality: N/A;  . CARDIAC CATHETERIZATION N/A 11/28/2015   Procedure: Coronary Balloon Angioplasty;  Surgeon: [name], MD;  Location: [place] [facility];  Service: Cardiovascular;  Laterality: N/A;  ostial LAD  . CARDIAC CATHETERIZATION N/A 11/28/2015   Procedure: Coronary/Graft Angiography;  Surgeon: [name], MD;  Location: [facility] [facility];  Service: Cardiovascular;  Laterality: N/A;  coronaries only   . CARDIAC CATHETERIZATION N/A 04/21/2016   Procedure: Left Heart Cath and Coronary Angiography;  Surgeon: Wellington Hampshire, MD;  Location: [place] [facility];  Service: Cardiovascular;  Laterality: N/A;  . CARDIAC CATHETERIZATION N/A 06/14/2016   Procedure: Left Heart Cath and Cors/Grafts Angiography;  Surgeon: Lorretta Harp, MD;  Location: [place] [facility];  Service: Cardiovascular;  Laterality: N/A;  . CARDIAC CATHETERIZATION N/A 09/08/2016   Procedure: Left Heart Cath and Cors/Grafts Angiography;  Surgeon: Wellington Hampshire, MD;  Location: [place];  Service: Cardiovascular;  Laterality: N/A;  . CARDIAC CATHETERIZATION    . CORONARY ARTERY BYPASS GRAFT N/A 11/28/2015   Procedure: CORONARY ARTERY BYPASS GRAFTING (CABG) TIMES FIVE USING LEFT INTERNAL MAMMARY ARTERY AND  RIGHT GREATER SAPHENOUS,VIEN HARVEATED BY ENDOVIEN, INTRAOPPRATIVE TEE;  Surgeon: Gaye Pollack, MD;  Location: [facility];  Service: Open Heart Surgery;  Laterality: N/A;  . CORONARY STENT INTERVENTION N/A 10/05/2017   Procedure: CORONARY STENT INTERVENTION;  Surgeon: [name], MD;  Location: [facility];  Service: Cardiovascular;  Laterality: N/A;  . HUMERUS SURGERY Right 1969   "tumor inside bone; filled it w/bone chips"  . LEFT HEART CATH AND CORS/GRAFTS ANGIOGRAPHY N/A 03/11/2017   Procedure: Left Heart Cath and Cors/Grafts  Angiography;  Surgeon: Leonie Man, MD;  Location: [facility] [facility];  Service: Cardiovascular;  Laterality: N/A;  . LEFT HEART CATH AND CORS/GRAFTS ANGIOGRAPHY N/A 10/05/2017   Procedure: LEFT HEART CATH AND CORS/GRAFTS ANGIOGRAPHY;  Surgeon: [name], MD;  Location: [place] [facility];  Service: Cardiovascular;  Laterality: N/A;  . PERIPHERAL VASCULAR CATHETERIZATION N/A 06/14/2016   Procedure: Lower Extremity Angiography;  Surgeon: Lorretta Harp, MD;  Location: [facility];  Service: Cardiovascular;  Laterality: N/A;    Social History   Social History   Tobacco Use  . Smoking status: Former Smoker    Packs/day: 0.75    Years: 44.00    Pack years: 33.00    Types: Cigarettes    Last attempt to quit: 11/28/2015    Years since quitting: 2.8  . Smokeless tobacco: Never Used  Substance Use Topics  . Alcohol use: No    Frequency: Never    Comment: 06/21/2017 "I'll have a few drinks q couple months"  . Drug use: No    Comment: 06/21/2017 "nothing since the 1980s"    Medications   Prior to Admission medications   Medication Sig Start Date End Date Taking? Authorizing Provider  acetaminophen (TYLENOL) 500 MG tablet Take 1,000 mg by mouth every 6 (six) hours as needed for mild pain or headache.    Yes [provider]  amLODipine (NORVASC) 10 MG tablet TAKE 1 TABLET BY MOUTH DAILY Patient taking differently: Take 10 mg by mouth daily.  08/07/18  Yes [name], MD  aspirin 81 MG tablet Take 81 mg by mouth daily.   Yes [provider]  atorvastatin (LIPITOR) 80 MG tablet TAKE 1 TABLET(80 MG) BY MOUTH DAILY Patient taking differently: Take 80 mg by mouth daily.  09/21/18  Yes [name], MD  chlordiazePOXIDE (LIBRIUM) 10 MG capsule Take 10-20 mg by mouth at bedtime as needed for anxiety.    Yes [provider]  clopidogrel (PLAVIX) 75 MG tablet TAKE 1 TABLET BY MOUTH EVERY DAY 05/26/18  Yes Weaver, Scott T, PA-C    lisinopril (PRINIVIL,ZESTRIL) 5 MG tablet Take 5 mg by mouth daily. May take 1 additional tablet once daily for SBP greater than 150 once hour after taking daily dose Patient taking differently: Take 5 mg by mouth See admin instructions. Take 5 mg by mouth daily. May take 1 additional tablet once daily for SBP greater than 150 once hour after taking daily dose 01/18/18  Yes [name], MD  metoprolol succinate (TOPROL XL) 50 MG 24 hr tablet Take 1 tablet (50 mg total) by mouth as directed. 50 mg in the AM and 25 mg in the PM Patient taking differently: Take 50 mg by mouth daily.  03/08/18  Yes Weaver, Scott T, PA-C  Multiple Vitamins-Minerals (ONE-A-DAY MENS 50+ ADVANTAGE PO) Take 1 tablet by mouth daily.    Yes [provider]  oxyCODONE (OXY IR/ROXICODONE) 5 MG immediate  release tablet Take 5-10 mg by mouth every 6 (six) hours. 10/15/18  Yes [provider]  oxyCODONE-acetaminophen (PERCOCET/ROXICET) 5-325 MG tablet Take 1 tablet by mouth every 6 (six) hours as needed for severe pain. 09/28/18  Yes Carlisle Cater, PA-C  pantoprazole (PROTONIX) 40 MG tablet Take 40 mg by mouth daily.   Yes [provider]  ranolazine (RANEXA) 1000 MG SR tablet Take 500 mg by mouth daily.   Yes [provider]  VIIBRYD 10 MG TABS Take 10 mg by mouth daily. 06/10/18  Yes [provider]  zolpidem (AMBIEN CR) 12.5 MG CR tablet Take 12.5 mg by mouth at bedtime as needed for sleep.  06/10/18  Yes [provider]  nitroGLYCERIN (NITROSTAT) 0.4 MG SL tablet PLACE 1 TABLET UNDER THE TONGUE EVERY 5 MINUTES AS NEEDED FOR CHEST PAIN. 3 DOSES MAX Patient taking differently: Place 0.4 mg under the tongue every 5 (five) minutes as needed for chest pain.  08/08/18   Strader, Fransisco Hertz, PA-C  ranitidine (ZANTAC) 150 MG tablet Take 150 mg by mouth as needed for heartburn.    [provider]  sucralfate (CARAFATE) 1 g tablet Take 1 tablet (1 g total) by mouth 4  (four) times daily -  with meals and at bedtime. Patient not taking: Reported on 09/28/2018 07/13/18 08/12/18  Lennice Sites, DO    Allergies   Allergies  Allergen Reactions  . Prednisone Other (See Comments)    States that this med makes him "crazy"  . Tetanus Toxoids Swelling and Other (See Comments)    Fever, Swelling of the arm   . Wellbutrin [Bupropion] Other (See Comments)    Crazy thoughts, nightmares  . Morphine And Related Hives and Itching    Redness at the injection site  . Scallops [Shellfish Allergy] Nausea Only  . Chantix [Varenicline] Other (See Comments)    Dreams    Review of Systems  ROS  Neurologic Exam  Awake, alert, oriented Memory and concentration grossly intact Speech fluent, appropriate CN grossly intact Motor exam: Upper Extremities Deltoid Bicep Tricep Grip  Right 5/5 5/5 5/5 5/5  Left 5/5 5/5 5/5 4+/5   Lower Extremities IP Quad PF DF EHL  Right 5/5 5/5 5/5 5/5 5/5  Left 5/5 5/5 5/5 5/5 5/5   4/5 left finger extension Sensation grossly intact to LT  Imaging  MRI Cspine demonstrates left C7-T1 disc herniation with foraminal stenosis. There is mild-moderated foraminal disease at other levels in the Cspine.  Impression  - 64 y.o. 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   Plan  - Will proceed with C7-T1 ACDF  I have reviewed with the patient and his wife the treatment options. Details of surgery were reviewed including risk of nerve/spinal cord injury, risk of bleeding, stroke, infection, CSF leak. Also, possible outcomes of surgery including persistence/worsening of pain was discussed. Possible need for additional future spine surgery was reviewed. We discussed expected postop course. All their questions today were answered and consent was obtained.

## 2018-10-17 NOTE — Op Note (Signed)
NEUROSURGERY OPERATIVE NOTE   PREOP DIAGNOSIS: Cervical HNP with radiculopathy, C7-T1  POSTOP DIAGNOSIS: Same  PROCEDURE: 1. Discectomy at C7-T1 for decompression of spinal cord and exiting nerve roots  2. Placement of intervertebral biomechanical device - Medtronic 79m lordotic PTC PEEK cage 3. Placement of anterior instrumentation consisting of interbody plate and screws - Medtronic Zevo with 128mscrews  4. Use of morselized bone allograft  5. Arthrodesis C7-T1, anterior interbody technique  6. Use of intraoperative microscope  SURGEON: Dr. NeConsuella LoseMD  ASSISTANT: ViFerne ReusPA-C  ANESTHESIA: General Endotracheal  EBL: 50cc  SPECIMENS: None  DRAINS: None  COMPLICATIONS: None immediate  CONDITION: Hemodynamically stable to PACU  HISTORY: [name] who initially presented to the outpatient clinic with right neck pain and hand weakness c/w C8 radiculopathy. MRI demonstrated left eccentric disc herniation at C7-T1. Treatment options were discussed including ACDF. After a thorogh discussion regarding alternative options, risks of surgery, and postoperative course, all questions were answered.  Informed consent was obtained.  PROCEDURE IN DETAIL: The patient was brought to the operating room and transferred to the operative table. After induction of general anesthesia, the patient was positioned on the operative table in the supine position with all pressure points meticulously padded. The skin of the neck was then prepped and draped in the usual sterile fashion.  After timeout was conducted, the skin was infiltrated with local anesthetic. Skin incision was then made sharply and Bovie electrocautery was used to dissect the subcutaneous tissue until the platysma was identified. The platysma was then divided and undermined. The sternocleidomastoid muscle was then identified and, utilizing natural fascial planes in the neck, the  prevertebral fascia was identified and the carotid sheath was retracted laterally and the trachea and esophagus retracted medially. Again using fluoroscopy, the correct disc space was identified. Bovie electrocautery was used to dissect in the subperiosteal plane and elevate the bilateral longus coli muscles. Self-retaining retractors were then placed. At this point, the microscope was draped and brought into the field, and the remainder of the case was done under the microscope using microdissecting technique.  The disc space was incised sharply and rongeurs were use to initially complete a discectomy. The high-speed drill was then used to complete discectomy until the posterior annulus was identified and removed and the posterior longitudinal ligament was identified. Using a nerve hook, the PLL was elevated, and Kerrison rongeurs were used to remove the posterior longitudinal ligament and the ventral thecal sac was identified. Using a combination of curettes and rongeurs, complete decompression of the thecal sac and exiting nerve roots at this level was completed, and verified using micro-nerve hook. Multiple free disc fragments were identified and removed from the left foramen.  Having completed our decompression, attention was turned to placement of the intervertebral device. Trial spacers were used to select a 52m20mordotic graft. This graft was then filled with morcellized allograft, and inserted.  After placement of the intervertebral device, the above anterior cervical plate was selected, and placed across the interspace. Using a high-speed drill, the cortex of the cervical vertebral bodies was punctured, and screws inserted in C7 and T1. Final fluoroscopic images in lateral projections were taken to confirm good hardware placement.  At this point, after all counts were verified to be correct, meticulous hemostasis was secured using a combination of bipolar electrocautery and passive hemostatics. The  platysma muscle was then closed using interrupted 3-0 Vicryl sutures, and the skin was closed with  an interrupted 3-0 Vicry subcuticular stitch. Dermabond and sterile dressings were then applied and the drapes removed.  The patient tolerated the procedure well and was extubated in the room and taken to the postanesthesia care unit in stable condition.

## 2018-10-17 NOTE — Telephone Encounter (Signed)
Ok.  Agree that he should take his antiplatelet until we know the date of surgery.

## 2018-10-17 NOTE — Anesthesia Preprocedure Evaluation (Addendum)
Anesthesia Evaluation  Patient identified by MRN, date of birth, ID band Patient awake    Reviewed: Allergy & Precautions, NPO status , Patient's Chart, lab work & pertinent test results, reviewed documented beta blocker date and time   Airway Mallampati: I  TM Distance: >3 FB Neck ROM: Full    Dental  (+) Edentulous Upper, Edentulous Lower, Upper Dentures, Lower Dentures, Dental Advisory Given   Pulmonary former smoker,    Pulmonary exam normal breath sounds clear to auscultation       Cardiovascular hypertension, Pt. on medications and Pt. on home beta blockers + CAD, + Past MI and + CABG  Normal cardiovascular exam Rhythm:Regular Rate:Normal     Neuro/Psych PSYCHIATRIC DISORDERS Anxiety Depression Bipolar Disorder    GI/Hepatic GERD  Medicated,  Endo/Other    Renal/GU      Musculoskeletal   Abdominal Normal abdominal exam  (+)   Peds  Hematology   Anesthesia Other Findings Procedure: 2D Echo  STAT ECHO  Indications:    R07.9* Chest pain, unspecified    Final Interpretation: Right Carotid: Velocities in the right ICA are consistent with a 40-59%                stenosis.  Left Carotid: Velocities in the left ICA are consistent with a 40-59% stenosis.  Vertebrals: Bilateral vertebral arteries demonstrate antegrade flow.  *See table(s) above for measurements and observations.     Electronically signed by Deitra Mayo MD on 03/25/2018 at 3:56:22 PM.   History:        Patient has prior history of Echocardiogram examinations. CAD                 and MI, Prior CABG; Risk Factors: Hypertension and Dyslipidemia.                 Hepatitis C                 Unstable angina.   Sonographer:    Talmage Coin Referring Phys: [place]    1. The left ventricle has normal systolic function of 52-84%. The cavity size is normal. There is normal left ventricular hypertrophy.  Echo evidence of normal diastolic filling patterns.  2. Normal left atrial size.  3. Normal right atrial size.  4. Normal tricuspid valve.  5. Tricuspid regurgitation is mild.  6. No atrial level shunt detected by color flow Doppler.  FINDINGS  Left Ventricle: No evidence of left ventricular regional wall motion abnormalities. The left ventricle has normal systolic function of 13-24%. The cavity size is normal. There is normal left ventricular hypertrophy. Echo evidence of normal diastolic  filling patterns.   Ost LAD to Prox LAD lesion is 50% stenosed.  Mid LAD lesion is 55% stenosed. LIMA to LAD is patent.  SVG to diagonal is occluded. There is no significant obstruction in the native diagonal.  Ost Cx lesion is 80% stenosed.  Prox Cx to Mid Cx lesion is 50% stenosed. SVG to OM is patent.  Dist RCA lesion is 50% stenosed.  RPDA lesion is 40% stenosed.  Ost RCA to Prox RCA lesion is 75% stenosed. Origin to Prox Graft lesion is 70% stenosed.  A drug-eluting stent was successfully placed using a STENT SYNERGY DES [id number], postdilated to 4.5 mm.  Post intervention, there is a 0% residual stenosis.  The left ventricular systolic function is normal.  LV end diastolic pressure is normal.  The left ventricular ejection fraction is 55-65%  by visual estimate.  There is no aortic valve stenosis.   Continue dual antiplatelet therapy for at least one year and, clopidogrel likely indefinitely.  Aggressive secondary prevention.       Reproductive/Obstetrics                         Anesthesia Physical Anesthesia Plan  ASA: III  Anesthesia Plan: General   Post-op Pain Management:    Induction: Intravenous  PONV Risk Score and Plan: 3 and Ondansetron, Dexamethasone and Midazolam  Airway Management Planned: Oral ETT  Additional Equipment:   Intra-op Plan:   Post-operative Plan: Extubation in OR  Informed Consent: I have reviewed the patients  History and Physical, chart, labs and discussed the procedure including the risks, benefits and alternatives for the proposed anesthesia with the patient or authorized representative who has indicated his/her understanding and acceptance.     Dental advisory given  Plan Discussed with: CRNA  Anesthesia Plan Comments:         Anesthesia Quick Evaluation

## 2018-10-17 NOTE — Anesthesia Procedure Notes (Signed)
Procedure Name: Intubation Date/Time: 10/17/2018 12:44 PM Performed by: Harden Mo, CRNA Pre-anesthesia Checklist: Patient identified, Emergency Drugs available, Suction available and Patient being monitored Patient Re-evaluated:Patient Re-evaluated prior to induction Oxygen Delivery Method: Circle System Utilized Preoxygenation: Pre-oxygenation with 100% oxygen Induction Type: IV induction Ventilation: Mask ventilation without difficulty and Oral airway inserted - appropriate to patient size Laryngoscope Size: Glidescope and 4 Grade View: Grade I Tube type: Oral Tube size: 7.5 mm Number of attempts: 1 Airway Equipment and Method: Stylet and Oral airway Placement Confirmation: ETT inserted through vocal cords under direct vision,  positive ETCO2 and breath sounds checked- equal and bilateral Secured at: 23 cm Tube secured with: Tape Dental Injury: Teeth and Oropharynx as per pre-operative assessment

## 2018-10-18 DIAGNOSIS — M5013 Cervical disc disorder with radiculopathy, cervicothoracic region: Secondary | ICD-10-CM | POA: Diagnosis not present

## 2018-10-18 LAB — PROTIME-INR
INR: 0.99
Prothrombin Time: 13 seconds (ref 11.4–15.2)

## 2018-10-18 LAB — BASIC METABOLIC PANEL
Anion gap: 11 (ref 5–15)
BUN: 19 mg/dL (ref 8–23)
CO2: 25 mmol/L (ref 22–32)
Calcium: 9.6 mg/dL (ref 8.9–10.3)
Chloride: 101 mmol/L (ref 98–111)
Creatinine, Ser: 1.06 mg/dL (ref 0.61–1.24)
GFR calc Af Amer: >60 mL/min (ref >60)
GFR calc non Af Amer: >60 mL/min (ref >60)
Glucose, Bld: 152 mg/dL — ABNORMAL HIGH (ref 70–99)
Potassium: 5.1 mmol/L (ref 3.5–5.1)
Sodium: 137 mmol/L (ref 135–145)

## 2018-10-18 LAB — CBC
HCT: 38.4 % — ABNORMAL LOW (ref 39.0–52.0)
Hemoglobin: 12.4 g/dL — ABNORMAL LOW (ref 13.0–17.0)
MCH: 29.5 pg (ref 26.0–34.0)
MCHC: 32.3 g/dL (ref 30.0–36.0)
MCV: 91.4 fL (ref 80.0–100.0)
Platelets: 224 10*3/uL (ref 150–400)
RBC: 4.2 MIL/uL — ABNORMAL LOW (ref 4.22–5.81)
RDW: 12.2 % (ref 11.5–15.5)
WBC: 18.3 10*3/uL — ABNORMAL HIGH (ref 4.0–10.5)
nRBC: 0 % (ref 0.0–0.2)

## 2018-10-18 LAB — APTT: aPTT: 44 seconds — ABNORMAL HIGH (ref 24–36)

## 2018-10-18 MED ORDER — ASPIRIN 81 MG PO TABS: 81.0000 mg | ORAL_TABLET | Freq: Every day | ORAL | Status: DC

## 2018-10-18 MED ORDER — OXYCODONE-ACETAMINOPHEN 7.5-325 MG PO TABS: 1.0000 | ORAL_TABLET | ORAL | 0 refills | Status: DC | PRN

## 2018-10-18 MED ORDER — METHOCARBAMOL 750 MG PO TABS: 750.0000 mg | ORAL_TABLET | Freq: Three times a day (TID) | ORAL | 1 refills | Status: DC | PRN

## 2018-10-18 MED ORDER — CLOPIDOGREL BISULFATE 75 MG PO TABS: 75.0000 mg | ORAL_TABLET | Freq: Every day | ORAL | 2 refills | Status: DC

## 2018-10-18 NOTE — Progress Notes (Signed)
  NEUROSURGERY PROGRESS NOTE   No issues overnight. Complains of appropriate neck soreness and hoarseness. Denies dysphagia. Pre op LUE pain significant improved  EXAM:  BP 112/68 (BP Location: Right Arm)   Pulse 83   Temp 97.6 F (36.4 C) (Oral)   Resp 16   Ht 5\' 9"  (1.753 m)   Wt 83.9 kg   SpO2 100%   BMI 27.32 kg/m   Awake, alert, oriented  Speech fluent, appropriate  CN grossly intact  5/5 BUE/BLE, 4+/5 left grip  PLAN Doing well this am D/C home

## 2018-10-18 NOTE — Anesthesia Postprocedure Evaluation (Signed)
Anesthesia Post Note  Patient: [name]  Procedure(s) Performed: Anterior Cervical Decompression Fusion - Cervical seven -Thoracic one (N/A )     Patient location during evaluation: PACU Anesthesia Type: General Level of consciousness: awake and sedated Pain management: pain level controlled Vital Signs Assessment: post-procedure vital signs reviewed and stable Respiratory status: spontaneous breathing Cardiovascular status: stable Postop Assessment: no apparent nausea or vomiting Anesthetic complications: no    Last Vitals:  Vitals:   10/18/18 0417 10/18/18 0739  BP: 121/71 112/68  Pulse: 63 83  Resp: 18 16  Temp: (!) 36.4 C 36.4 C  SpO2: 98% 100%    Last Pain:  Vitals:   10/18/18 0739  TempSrc: Oral  PainSc:    Pain Goal: Patients Stated Pain Goal: 3 (10/18/18 0428)                 Huston Foley

## 2018-10-18 NOTE — Discharge Summary (Signed)
Physician Discharge Summary  Patient ID: [name] MRN: [id number] DOB/AGE: 1956/04/28 63 y.o.  Admit date: 10/17/2018 Discharge date: 10/18/2018  Admission Diagnoses:  Cervical radiculopathy  Discharge Diagnoses:  Same Active Problems:   Cervical radiculopathy   Discharged Condition: Stable  Hospital Course:  [name] is a 63 y.o. male who was admitted for the below procedure. There were no post operative complications. At time of discharge, pain was well controlled, ambulating with Pt/OT, tolerating po, voiding normal. Ready for discharge.  Treatments: Surgery - C7-T1 ACDF  Discharge Exam: Blood pressure 112/68, pulse 83, temperature 97.6 F (36.4 C), temperature source Oral, resp. rate 16, height 5\' 9"  (1.753 m), weight 83.9 kg, SpO2 100 %. Awake, alert, oriented Speech fluent, appropriate CN grossly intact 5/5 BUE/BLE, mild right grip strength weakness Wound c/d/i  Disposition: Discharge disposition: 01-Home or Self Care       Discharge Instructions    Call MD for:  difficulty breathing, headache or visual disturbances   Complete by:  As directed    Call MD for:  persistant dizziness or light-headedness   Complete by:  As directed    Call MD for:  redness, tenderness, or signs of infection (pain, swelling, redness, odor or green/yellow discharge around incision site)   Complete by:  As directed    Call MD for:  severe uncontrolled pain   Complete by:  As directed    Call MD for:  temperature >100.4   Complete by:  As directed    Diet general   Complete by:  As directed    Driving Restrictions   Complete by:  As directed    Do not drive until given clearance.   Increase activity slowly   Complete by:  As directed    Lifting restrictions   Complete by:  As directed    Do not lift anything >10lbs. Avoid bending and twisting in awkward positions. Avoid bending at the back.   May shower / Bathe   Complete by:  As directed    In 24 hours. Okay to  wash wound with warm soapy water. Avoid scrubbing the wound. Pat dry.   Remove dressing in 24 hours   Complete by:  As directed      Allergies as of 10/18/2018      Reactions   Prednisone Other (See Comments)   States that this med makes him "crazy"   Tetanus Toxoids Swelling, Other (See Comments)   Fever, Swelling of the arm    Wellbutrin [bupropion] Other (See Comments)   Crazy thoughts, nightmares   Morphine And Related Hives, Itching   Redness at the injection site   Scallops [shellfish Allergy] Nausea Only   Chantix [varenicline] Other (See Comments)   Dreams      Medication List    STOP taking these medications   oxyCODONE 5 MG immediate release tablet Commonly known as:  Oxy IR/ROXICODONE   oxyCODONE-acetaminophen 5-325 MG tablet Commonly known as:  PERCOCET/ROXICET Replaced by:  oxyCODONE-acetaminophen 7.5-325 MG tablet     TAKE these medications   acetaminophen 500 MG tablet Commonly known as:  TYLENOL Take 1,000 mg by mouth every 6 (six) hours as needed for mild pain or headache.   amLODipine 10 MG tablet Commonly known as:  NORVASC TAKE 1 TABLET BY MOUTH DAILY   aspirin 81 MG tablet Take 1 tablet (81 mg total) by mouth daily. Start taking on:  October 24, 2018 What changed:  These instructions start on October 24, 2018. If  you are unsure what to do until then, ask your doctor or other care provider.   atorvastatin 80 MG tablet Commonly known as:  LIPITOR TAKE 1 TABLET(80 MG) BY MOUTH DAILY What changed:  See the new instructions.   chlordiazePOXIDE 10 MG capsule Commonly known as:  LIBRIUM Take 10-20 mg by mouth at bedtime as needed for anxiety.   clopidogrel 75 MG tablet Commonly known as:  PLAVIX Take 1 tablet (75 mg total) by mouth daily. Start taking on:  October 24, 2018 What changed:  These instructions start on October 24, 2018. If you are unsure what to do until then, ask your doctor or other care provider.   lisinopril 5 MG  tablet Commonly known as:  PRINIVIL,ZESTRIL Take 5 mg by mouth daily. May take 1 additional tablet once daily for SBP greater than 150 once hour after taking daily dose What changed:    how much to take  how to take this  when to take this   methocarbamol 750 MG tablet Commonly known as:  ROBAXIN-750 Take 1 tablet (750 mg total) by mouth 3 (three) times daily as needed for muscle spasms.   metoprolol succinate 50 MG 24 hr tablet Commonly known as:  TOPROL XL Take 1 tablet (50 mg total) by mouth as directed. 50 mg in the AM and 25 mg in the PM What changed:    when to take this  additional instructions   nitroGLYCERIN 0.4 MG SL tablet Commonly known as:  NITROSTAT PLACE 1 TABLET UNDER THE TONGUE EVERY 5 MINUTES AS NEEDED FOR CHEST PAIN. 3 DOSES MAX What changed:  See the new instructions.   ONE-A-DAY MENS 50+ ADVANTAGE PO Take 1 tablet by mouth daily.   oxyCODONE-acetaminophen 7.5-325 MG tablet Commonly known as:  PERCOCET Take 1 tablet by mouth every 4 (four) hours as needed. Replaces:  oxyCODONE-acetaminophen 5-325 MG tablet   pantoprazole 40 MG tablet Commonly known as:  PROTONIX Take 40 mg by mouth daily.   ranitidine 150 MG tablet Commonly known as:  ZANTAC Take 150 mg by mouth as needed for heartburn.   ranolazine 1000 MG SR tablet Commonly known as:  RANEXA Take 500 mg by mouth daily.   sucralfate 1 g tablet Commonly known as:  CARAFATE Take 1 tablet (1 g total) by mouth 4 (four) times daily -  with meals and at bedtime.   VIIBRYD 10 MG Tabs Generic drug:  Vilazodone HCl Take 10 mg by mouth daily.   zolpidem 12.5 MG CR tablet Commonly known as:  AMBIEN CR Take 12.5 mg by mouth at bedtime as needed for sleep.      Follow-up Information    Consuella Lose, MD. Schedule an appointment as soon as possible for a visit in 3 week(s).   Specialty:  Neurosurgery Contact information: [address] [place]  [address] [phone]           Signed: Traci Sermon 10/18/2018, 7:53 AM

## 2018-10-18 NOTE — Evaluation (Signed)
Occupational Therapy Evaluation Patient Details Name: [name] MRN: [id number] DOB: May 20, 1956 Today's Date: 10/18/2018    History of Present Illness Patient is a 63 y/o male admitted for left neck and arm pain which started without identifiable incident. With his significant cardiac history he was evaluated int he ED and ACS was ruled out. His MRI did demonstrate left-sided C7-T1 disease. Neurosurgery performed C7-T1 discectomy for decompression.  Pmhx: GERD, CAD s/p MI, CABG, depression bipolar d/c.   Clinical Impression   PTA: pt living at home working and independent with ADL and IADL. Pt currently limited by tingling in LUE. Pt's LUE is WFLs for AROM, 3+/5 MM grade shoulder through elbow and grip. Pt able to use LUE for handwriting tasks that are legible. Pt performing ADLs with supervisionA to adhere by cervical precautions. Pt managing a full flight of stairs in prep for 2 level home. Pt reports that his LUE has improved and denies need for continued OT. No follow-up OT at this time- may see surgeon and decide if coordination has not improved more.    Follow Up Recommendations  No OT follow up;Supervision - Intermittent    Equipment Recommendations  None recommended by OT    Recommendations for Other Services       Precautions / Restrictions Precautions Precautions: Cervical Required Braces or Orthoses: Cervical Brace Cervical Brace: Soft collar;For comfort Restrictions Weight Bearing Restrictions: No      Mobility Bed Mobility Overal bed mobility: Modified Independent Bed Mobility: Rolling;Sidelying to Sit Rolling: Modified independent (Device/Increase time) Sidelying to sit: Modified independent (Device/Increase time)       General bed mobility comments: good carry over skills after demo and verbalizing technique  Transfers Overall transfer level: Modified independent Equipment used: None                  Balance Overall balance assessment: Modified  Independent                                         ADL either performed or assessed with clinical judgement   ADL Overall ADL's : At baseline(Pt able to use figure 4 technique for LB ADL )                                       General ADL Comments: figure 4 technique for LB ADL, no difficulty transferring on/off regular ht commode. Pt has spouse at home to assist in AM and PM. Pt practicing full flight of stairs with [place].     Vision Baseline Vision/History: No visual deficits Patient Visual Report: No change from baseline Vision Assessment?: No apparent visual deficits     Perception     Praxis      Pertinent Vitals/Pain Pain Score: 3  Pain Location: neck Pain Descriptors / Indicators: Operative site guarding Pain Intervention(s): Monitored during session     Hand Dominance Left   Extremity/Trunk Assessment Upper Extremity Assessment Upper Extremity Assessment: Generalized weakness;LUE deficits/detail LUE Deficits / Details: ROM is WFLs, grip strength remais 3+/5 and continues to have some tingling in hand. LUE Sensation: decreased light touch LUE Coordination: decreased fine motor(handwriting improving per pt and legible per this OTR)   Lower Extremity Assessment Lower Extremity Assessment: Generalized weakness   Cervical / Trunk Assessment Cervical / Trunk Assessment: Normal  Communication Communication Communication: No difficulties   Cognition Arousal/Alertness: Awake/alert Behavior During Therapy: WFL for tasks assessed/performed Overall Cognitive Status: Within Functional Limits for tasks assessed                                     General Comments       Exercises Exercises: General Upper Extremity General Exercises - Upper Extremity Shoulder Flexion: AROM;10 reps;Seated Shoulder ABduction: AROM;10 reps;Seated Elbow Flexion: AROM;10 reps;Seated Elbow Extension: AROM;10 reps;Seated    Shoulder Instructions      Home Living Family/patient expects to be discharged to:: Private residence Living Arrangements: Spouse/significant other Available Help at Discharge: Family;Available PRN/intermittently Type of Home: House Home Access: Stairs to enter [employer] of Steps: 6 Entrance Stairs-Rails: Right;Left Home Layout: Two level;Bed/bath upstairs Alternate Level Stairs-Number of Steps: 13 Alternate Level Stairs-Rails: Left Bathroom Shower/Tub: Walk-in shower;Tub/shower unit   Armed forces training and education officer: No   Home Equipment: None          Prior Functioning/Environment Level of Independence: Independent        Comments: owns [employer]        OT Problem List: Decreased strength      OT Treatment/Interventions:      OT Goals(Current goals can be found in the care plan section) Acute Rehab OT Goals Patient Stated Goal: to go back to work OT Goal Formulation: With patient Time For Goal Achievement: 11/01/18 Potential to Achieve Goals: Fair  OT Frequency:     Barriers to D/C:            Co-evaluation              AM-PAC OT "6 Clicks" Daily Activity     Outcome Measure Help from another person eating meals?: None Help from another person taking care of personal grooming?: None Help from another person toileting, which includes using toliet, bedpan, or urinal?: None Help from another person bathing (including washing, rinsing, drying)?: A Little Help from another person to put on and taking off regular upper body clothing?: None Help from another person to put on and taking off regular lower body clothing?: None 6 Click Score: 23   End of Session Equipment Utilized During Treatment: Other (comment)(soft cervical brace) Nurse Communication: Mobility status  Activity Tolerance: Patient tolerated treatment well Patient left: in bed;with call bell/phone within reach  OT Visit Diagnosis: Muscle  weakness (generalized) (M62.81);Pain Pain - Right/Left: Left Pain - part of body: Arm                Time: 3810-1751 OT Time Calculation (min): 25 min Charges:  OT General Charges $OT Visit: 1 Visit OT Evaluation $OT Eval Moderate Complexity: 1 Mod OT Treatments $Self Care/Home Management : 8-22 mins  [name]) Marsa Aris OTR/L Acute Rehabilitation Services Pager: [phone] Office: [phone]   [name] 10/18/2018, 9:51 AM

## 2018-10-18 NOTE — Progress Notes (Signed)
Patient alert and oriented, mae's well, voiding adequate amount of urine, swallowing without difficulty, no c/o pain at time of discharge. Patient discharged home with family. Script and discharged instructions given to patient. Patient and family stated understanding of instructions given. Patient has an appointment with [name]

## 2018-10-20 ENCOUNTER — Encounter (HOSPITAL_COMMUNITY): Payer: Self-pay | Admitting: Neurosurgery

## 2018-10-21 ENCOUNTER — Encounter (HOSPITAL_COMMUNITY): Payer: Self-pay | Admitting: Neurosurgery

## 2018-10-26 ENCOUNTER — Telehealth: Payer: Self-pay | Admitting: Interventional Cardiology

## 2018-10-26 NOTE — Telephone Encounter (Signed)
Returned call to patient. Patient states that his BP earlier was 160/105 HR 110. Patient states that he has not had any of his medicines today. Patient denies having any chest pain, SOB, syncope, HAs, changes in vision, speech, or strength. Patient had recent cervical surgery and states that he is still having some pain from that. Patient states that his BP is normally 120-130/80 HR 80s.  Instructed the patient to take his meds as prescribed. Made patient aware that he also has prn 5 mg of lisinopril if SBP >150. Instructed patient to continue monitoring his BP after taking his meds and taking them on a regular schedule to see how they are and call to report his readings. ER precautions reviewed with the patient. Patient verbalized understanding and thanked me for the call.

## 2018-10-26 NOTE — Telephone Encounter (Signed)
New message     Pt c/o BP issue: STAT if pt c/o blurred vision, one-sided weakness or slurred speech  1. What are your last 5 BP readings? 139/105 bp 72 hr on 10/26/2018   2. Are you having any other symptoms (ex. Dizziness, headache, blurred vision, passed out)? Lightheadedness   3. What is your BP issue? Patient states that his bp is elevated

## 2019-01-07 IMAGING — DX DG CHEST 2V
2 series · 2 of 2 positions shown · non-contrast
Comparison: Chest radiograph dated 01/04/2017

CLINICAL DATA: 60-year-old male with chest and abdominal pain.

EXAM:
CHEST  2 VIEW

[chest pa]
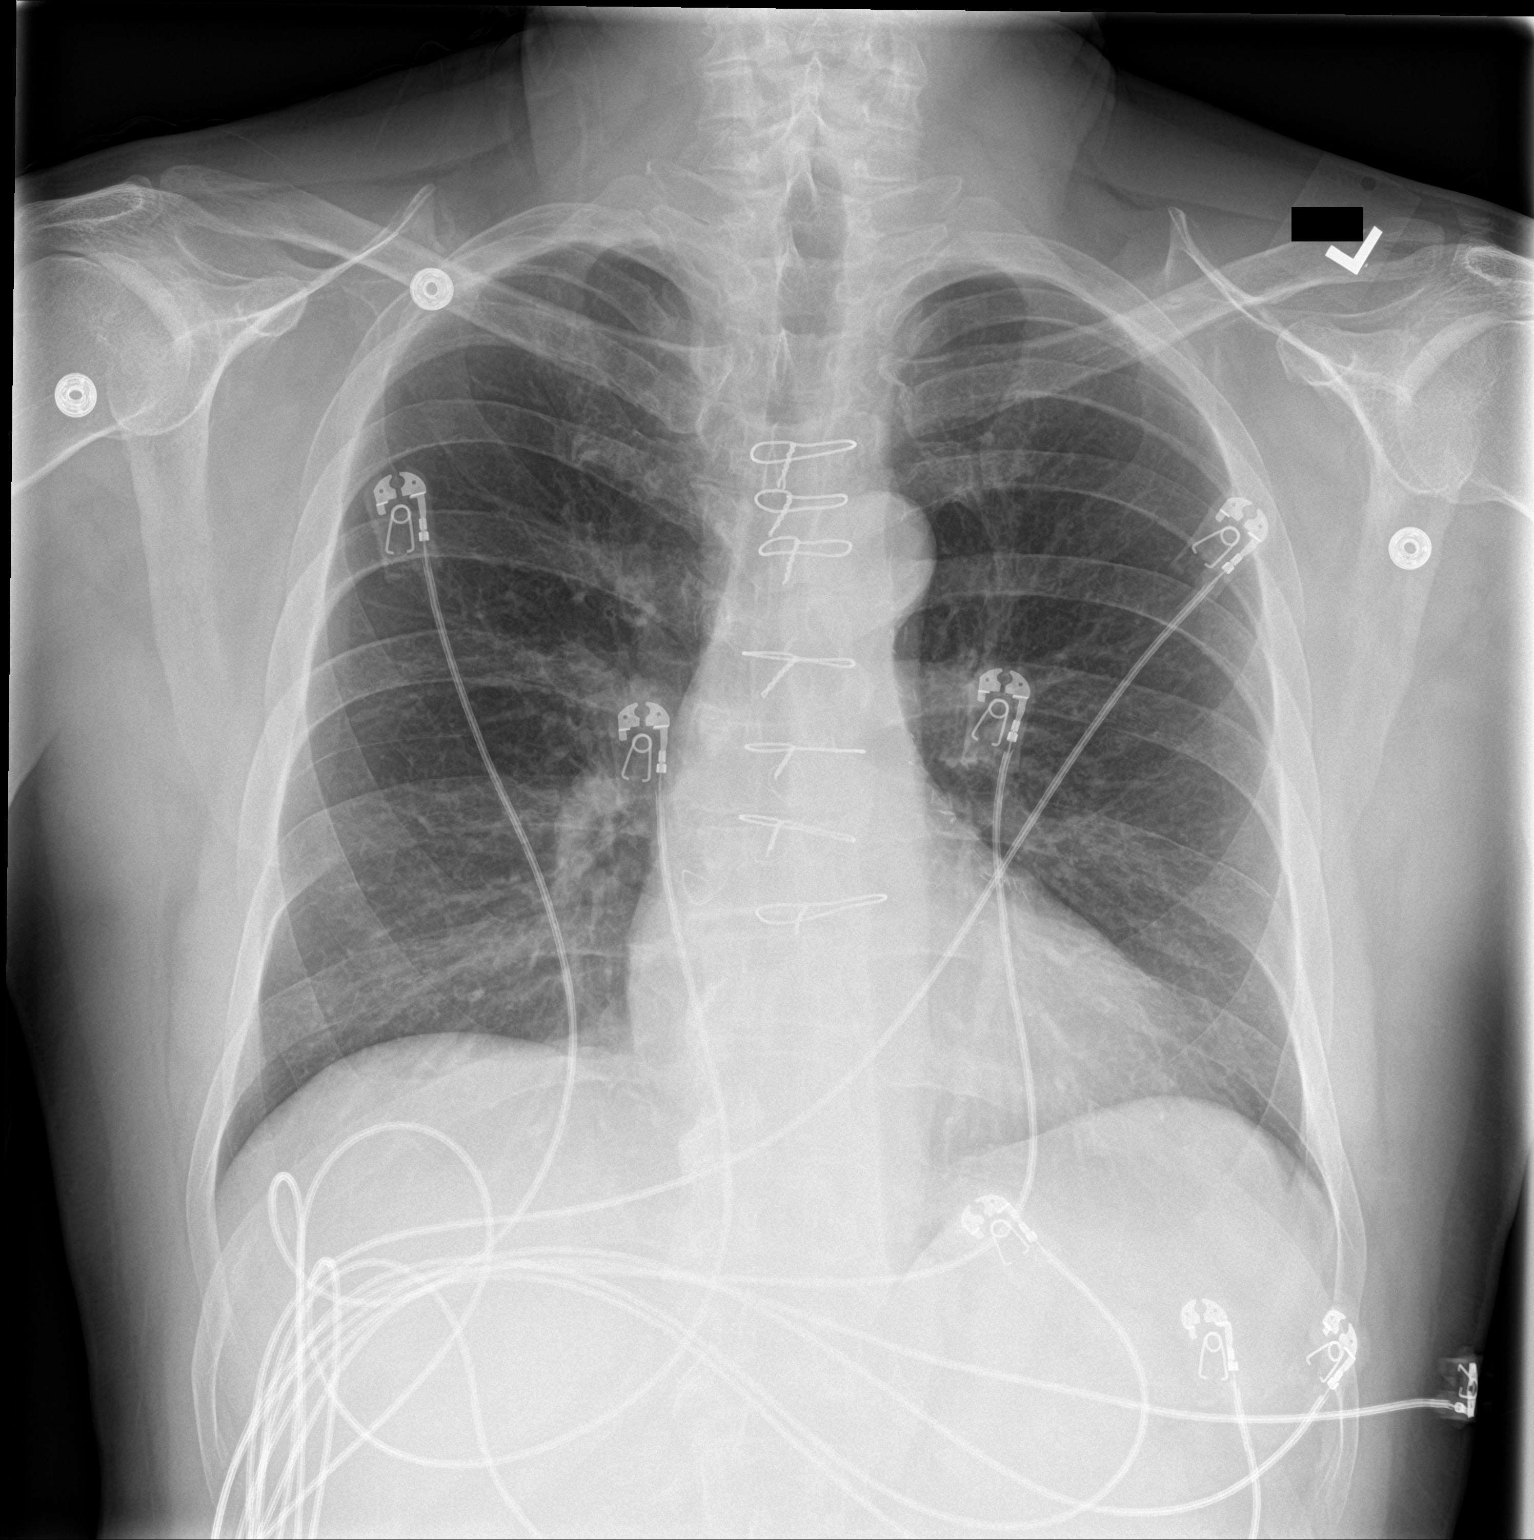

[chest lat]
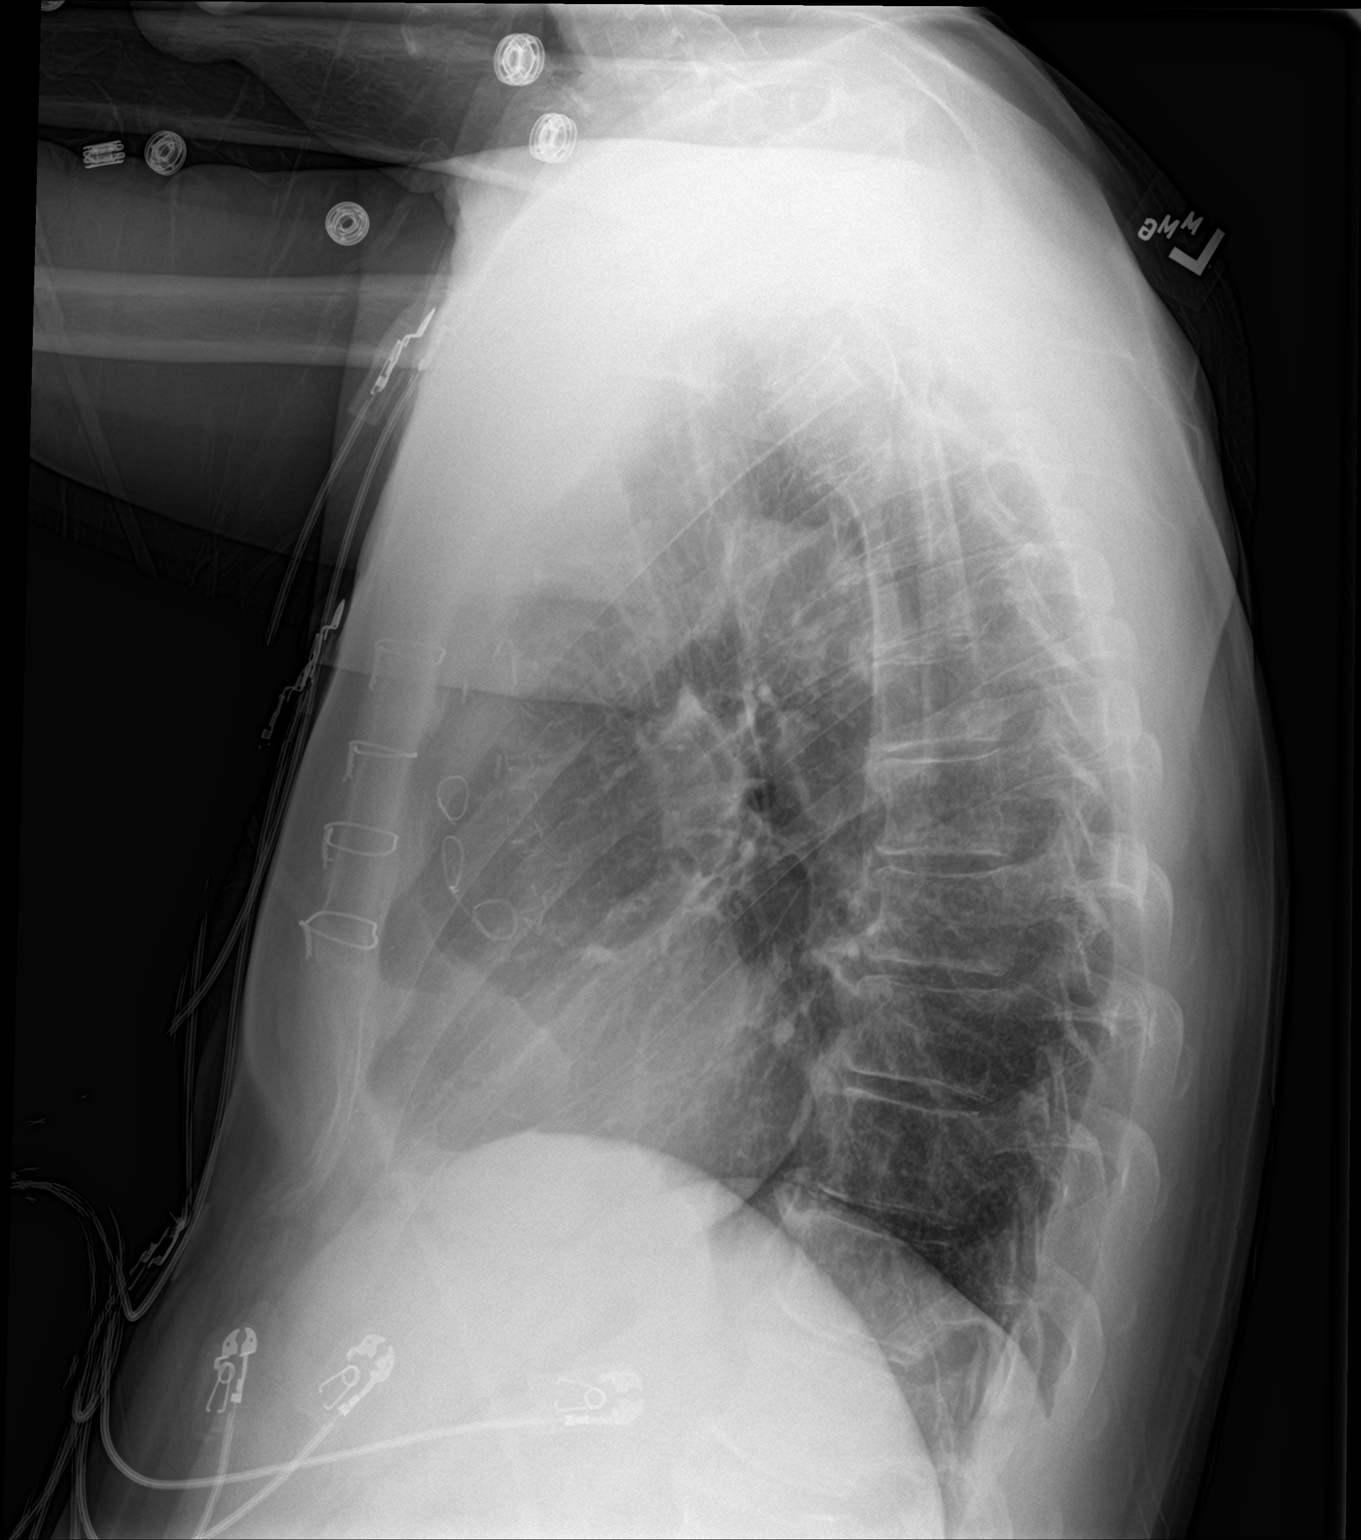

[2 of 2 positions shown; findings below may reference images not displayed]

FINDINGS: The lungs are clear. There is no pleural effusion or pneumothorax.
The cardiac silhouette is within normal limits. Median sternotomy
wires and CABG vascular clips noted. No acute osseous pathology.
IMPRESSION: No active cardiopulmonary disease.

## 2019-01-23 ENCOUNTER — Telehealth: Payer: Self-pay | Admitting: Interventional Cardiology

## 2019-01-23 MED ORDER — METOPROLOL SUCCINATE ER 25 MG PO TB24: 12.5000 mg | ORAL_TABLET | Freq: Every day | ORAL | 3 refills | Status: DC

## 2019-01-23 NOTE — Telephone Encounter (Signed)
Called and made patient aware of Dr. Hassell Done recommendations to decrease metoprolol to 12.5 mg QD

## 2019-01-23 NOTE — Telephone Encounter (Signed)
Called and spoke to patient. Patient states for the past couple of weeks he has had no energy and feels sleepy all of the time. BP 120/80 HR 70s. Denies chest pain, SOB, lightheadedness, dizziness, or any other Sx. Sleeps well at night. Denies using pain meds.  Instructed patient to increase activity. Patient wanting to know if he can cut back on his meds to see if that helps.

## 2019-01-23 NOTE — Telephone Encounter (Signed)
New Message    Pt says he feels fine but in the last few weeks he is feeling more sleepy and thinks its from all of the medication he is taking    Please call

## 2019-01-23 NOTE — Telephone Encounter (Signed)
OK to decrease metoprolol to 12.5 mg daily

## 2019-02-06 ENCOUNTER — Emergency Department (HOSPITAL_COMMUNITY)
Admission: EM | Admit: 2019-02-06 | Discharge: 2019-02-07 | Disposition: A | Discharge status: Home / Self Care | Payer: BC Managed Care – PPO | Attending: Emergency Medicine | Admitting: Emergency Medicine

## 2019-02-06 DIAGNOSIS — Z8673 Personal history of transient ischemic attack (TIA), and cerebral infarction without residual deficits: Secondary | ICD-10-CM | POA: Diagnosis not present

## 2019-02-06 DIAGNOSIS — Z87891 Personal history of nicotine dependence: Secondary | ICD-10-CM | POA: Diagnosis not present

## 2019-02-06 DIAGNOSIS — Z951 Presence of aortocoronary bypass graft: Secondary | ICD-10-CM | POA: Diagnosis not present

## 2019-02-06 DIAGNOSIS — R071 Chest pain on breathing: Secondary | ICD-10-CM | POA: Diagnosis not present

## 2019-02-06 DIAGNOSIS — R079 Chest pain, unspecified: Secondary | ICD-10-CM | POA: Insufficient documentation

## 2019-02-06 DIAGNOSIS — Z79899 Other long term (current) drug therapy: Secondary | ICD-10-CM | POA: Diagnosis not present

## 2019-02-06 DIAGNOSIS — Z85828 Personal history of other malignant neoplasm of skin: Secondary | ICD-10-CM | POA: Diagnosis not present

## 2019-02-06 DIAGNOSIS — Z20828 Contact with and (suspected) exposure to other viral communicable diseases: Secondary | ICD-10-CM | POA: Diagnosis not present

## 2019-02-06 DIAGNOSIS — I2510 Atherosclerotic heart disease of native coronary artery without angina pectoris: Secondary | ICD-10-CM | POA: Insufficient documentation

## 2019-02-06 DIAGNOSIS — Z7982 Long term (current) use of aspirin: Secondary | ICD-10-CM | POA: Diagnosis not present

## 2019-02-06 DIAGNOSIS — R05 Cough: Secondary | ICD-10-CM | POA: Insufficient documentation

## 2019-02-06 DIAGNOSIS — R070 Pain in throat: Secondary | ICD-10-CM | POA: Insufficient documentation

## 2019-02-06 DIAGNOSIS — I252 Old myocardial infarction: Secondary | ICD-10-CM | POA: Diagnosis not present

## 2019-02-06 DIAGNOSIS — R110 Nausea: Secondary | ICD-10-CM | POA: Insufficient documentation

## 2019-02-06 DIAGNOSIS — J111 Influenza due to unidentified influenza virus with other respiratory manifestations: Secondary | ICD-10-CM | POA: Diagnosis not present

## 2019-02-06 DIAGNOSIS — R51 Headache: Secondary | ICD-10-CM | POA: Diagnosis present

## 2019-02-07 ENCOUNTER — Other Ambulatory Visit: Payer: Self-pay

## 2019-02-07 ENCOUNTER — Encounter (HOSPITAL_COMMUNITY): Payer: Self-pay | Admitting: Emergency Medicine

## 2019-02-07 ENCOUNTER — Emergency Department (HOSPITAL_COMMUNITY): Payer: BC Managed Care – PPO

## 2019-02-07 LAB — BASIC METABOLIC PANEL
Anion gap: 11 (ref 5–15)
BUN: 11 mg/dL (ref 8–23)
CO2: 20 mmol/L — ABNORMAL LOW (ref 22–32)
Calcium: 8.8 mg/dL — ABNORMAL LOW (ref 8.9–10.3)
Chloride: 107 mmol/L (ref 98–111)
Creatinine, Ser: 1.04 mg/dL (ref 0.61–1.24)
GFR calc Af Amer: >60 mL/min (ref >60)
GFR calc non Af Amer: >60 mL/min (ref >60)
Glucose, Bld: 91 mg/dL (ref 70–99)
Potassium: 3.5 mmol/L (ref 3.5–5.1)
Sodium: 138 mmol/L (ref 135–145)

## 2019-02-07 LAB — CBC
HCT: 39.4 % (ref 39.0–52.0)
Hemoglobin: 13.1 g/dL (ref 13.0–17.0)
MCH: 30.6 pg (ref 26.0–34.0)
MCHC: 33.2 g/dL (ref 30.0–36.0)
MCV: 92.1 fL (ref 80.0–100.0)
Platelets: 181 10*3/uL (ref 150–400)
RBC: 4.28 MIL/uL (ref 4.22–5.81)
RDW: 12.7 % (ref 11.5–15.5)
WBC: 8.6 10*3/uL (ref 4.0–10.5)
nRBC: 0 % (ref 0.0–0.2)

## 2019-02-07 LAB — HEPATIC FUNCTION PANEL
ALT: 18 U/L (ref 0–44)
AST: 19 U/L (ref 15–41)
Albumin: 3.5 g/dL (ref 3.5–5.0)
Alkaline Phosphatase: 56 U/L (ref 38–126)
Bilirubin, Direct: 0.3 mg/dL — ABNORMAL HIGH (ref 0.0–0.2)
Indirect Bilirubin: 0.8 mg/dL (ref 0.3–0.9)
Total Bilirubin: 1.1 mg/dL (ref 0.3–1.2)
Total Protein: 6.2 g/dL — ABNORMAL LOW (ref 6.5–8.1)

## 2019-02-07 LAB — TROPONIN I
Troponin I: <0.03 ng/mL (ref <0.03)
Troponin I: <0.03 ng/mL (ref <0.03)

## 2019-02-07 MED ORDER — SODIUM CHLORIDE 0.9 % IV BOLUS
1000.0000 mL | Freq: Once | INTRAVENOUS | Status: AC
  Administered 2019-02-07: 1000 mL via INTRAVENOUS

## 2019-02-07 MED ORDER — DIPHENHYDRAMINE HCL 50 MG/ML IJ SOLN
25.0000 mg | Freq: Once | INTRAMUSCULAR | Status: AC
  Administered 2019-02-07: 25 mg via INTRAVENOUS
  Filled 2019-02-07: qty 1

## 2019-02-07 MED ORDER — METOCLOPRAMIDE HCL 5 MG/ML IJ SOLN
10.0000 mg | Freq: Once | INTRAMUSCULAR | Status: AC
  Administered 2019-02-07: 10 mg via INTRAVENOUS
  Filled 2019-02-07: qty 2

## 2019-02-07 MED ORDER — SODIUM CHLORIDE 0.9% FLUSH
3.0000 mL | Freq: Once | INTRAVENOUS | Status: AC
  Administered 2019-02-07: 3 mL via INTRAVENOUS

## 2019-02-07 NOTE — ED Triage Notes (Signed)
Report from [facility]> Reports body aches, non-productive cough and decreased appetite since yesterday.  Denies fever.  Called PCP and supposed to be COVID tested tomorrow.  Since 9:30pm, reports non-radiating CP, nausea, and generalized weakness.  EMS administered 4 baby ASA, Zofran 4mg , and 1 NTG.  Pain decreased from 4/10 to 2/10 and then back up to 4/10.  BP dropped to 96/ after NTG.  NS 500cc bolus given.  No improvement with Zofran.

## 2019-02-07 NOTE — ED Notes (Signed)
Discharge instructions discussed with pt. Pt verbalized understanding. Pt states no other questions at this time.

## 2019-02-07 NOTE — ED Notes (Signed)
Pt. States pain has improved. Rates Pain 2 on scale 0-10. Pt. States "it's not a pain. It's more of a discomfort."

## 2019-02-07 NOTE — Discharge Instructions (Signed)
You need to quarantine yourself at home. Your COVID-19 test result should be available in two days. If the test is negative, you can discontinue quarantine. If it is positive, you will need to continue quarantine for a total of two weeks, or until you are symptom-free for three days.  Drink plenty of fluids.  Take acetaminophen or ibuprofen as needed for fever or aching.  Return if you develop shortness of breath or confusion. Or if you develop any other symptoms that concern you.

## 2019-02-07 NOTE — ED Provider Notes (Signed)
[facility] EMERGENCY DEPARTMENT Provider Note   CSN: [id number] Arrival date & time: 02/06/19  2358    History   Chief Complaint Chief Complaint  Patient presents with   Chest Pain   Nausea    HPI [name] is a 63 y.o. male.   The history is provided by the patient.  He has history of hypertension, hyperlipidemia, ischemic cardiomyopathy and comes in with flulike illness for the last 24 hours.  He describes chills, headache, sore throat, nonproductive cough with nausea.  He denies fever or sweats.  He denies dyspnea.  He has had some sharp, pressure pain in his mid chest which is worse when he takes a very deep breath.  He has not vomited and denies any diarrhea.  He denies other body aches.  He denies exposure to anyone else with a similar illness and specifically denies exposure to COVID-19.  He did take acetaminophen for his headache, but got no relief.  Past Medical History:  Diagnosis Date   Anxiety    Arthritis    Basal cell carcinoma (BCC) of forehead    CAD (coronary artery disease)    a. 10/2015 ant STEMI >> LHC with 3 v CAD; oLAD tx with POBA >> emergent CABG. b. Multiple evals since that time, early graft failure of SVG-RCA by cath 03/2016. c. 2/19 PCI/DES x1 to pRCA, normal EF.   Carotid artery disease ([place])    a. 40-59% BICA 02/2018.   Depression    Dyspnea    Ectopic atrial tachycardia (HCC)    Esophageal reflux    eosinophil esophagitis   Family history of adverse reaction to anesthesia    "sister has PONV" (06/21/2017)   Former tobacco use    Gout    Hepatitis C    "treated and cured" (06/21/2017)   High cholesterol    History of kidney stones    Hypertension    Ischemic cardiomyopathy    a. EF 25-30% at intraop TEE 4/17  //  b. Limited Echo 5/17 - EF 45-50%, mild ant HK. c. EF 55-65% by cath 09/2017.   Migraine    "3-4/yr" (06/21/2017)   Myocardial infarction ([place]) 10/2015   Palpitations    Sinus  bradycardia    a. HR dropping into 40s in 02/2016 -> BB reduced.   Stroke [facility]) 10/2016   "small one; sometimes my memory/cognitive issues" (06/21/2017)   Symptomatic hypotension    a. 02/2016 ER visit -> meds reduced.   Syncope     Patient Active Problem List   Diagnosis Date Noted   Cervical radiculopathy 10/17/2018   Preoperative clearance 10/04/2018   Palpitations    Coronary artery disease of bypass graft of native heart with stable angina pectoris ([place])    Transient loss of consciousness 03/24/2018   Ectopic atrial tachycardia ([place]) 02/09/2018   Central chest pain 03/10/2017   Family hx-stroke 11/10/2016   Stroke-like episode ([place]) - R brain, s/p tPA 11/09/2016   Unstable angina ([place]) 09/07/2016   Claudication of both lower extremities ([place])    Pure hypercholesterolemia    Tobacco abuse disorder    CAD of autologous artery bypass graft without angina    Chest pain at rest 06/10/2016   Abnormal nuclear stress test - HIGH RISK 04/20/2016   Old MI (myocardial infarction)    Essential hypertension 02/26/2016   Ischemic cardiomyopathy 12/25/2015   Hyperlipidemia LDL goal <70 12/25/2015   Mild tobacco abuse in early remission 11/28/2015   Coronary artery  disease involving native coronary artery of native heart with unstable angina pectoris ([place]) 11/28/2015   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   Acute MI anterior wall first episode care [facility])    Chest pain with high risk for cardiac etiology 03/07/2015   Dysphagia 03/07/2015   Gout attack 03/07/2015   Mixed bipolar I disorder ([place]) 03/07/2015   Fibromyalgia 07/09/2014   Gout 07/09/2014   Anxiety 07/09/2014   Depression 07/09/2014   Hepatitis C 28/41/3244   Eosinophilic esophagitis 09/01/7251    Past Surgical History:  Procedure Laterality Date   ANTERIOR CERVICAL DECOMP/DISCECTOMY FUSION N/A 10/17/2018   Procedure: Anterior Cervical Decompression Fusion - Cervical seven -Thoracic one;  Surgeon:  Consuella Lose, MD;  Location: [place];  Service: Neurosurgery;  Laterality: N/A;   BASAL CELL CARCINOMA EXCISION     "forehead   CARDIAC CATHETERIZATION N/A 11/28/2015   Procedure: Left Heart Cath and Coronary Angiography;  Surgeon: [name], MD;  Location: [place] CV LAB;  Service: Cardiovascular;  Laterality: N/A;   CARDIAC CATHETERIZATION N/A 11/28/2015   Procedure: Coronary Balloon Angioplasty;  Surgeon: [name], MD;  Location: [place] CV LAB;  Service: Cardiovascular;  Laterality: N/A;  ostial LAD   CARDIAC CATHETERIZATION N/A 11/28/2015   Procedure: Coronary/Graft Angiography;  Surgeon: [name], MD;  Location: [place] CV LAB;  Service: Cardiovascular;  Laterality: N/A;  coronaries only    CARDIAC CATHETERIZATION N/A 04/21/2016   Procedure: Left Heart Cath and Coronary Angiography;  Surgeon: Wellington Hampshire, MD;  Location: [place] CV LAB;  Service: Cardiovascular;  Laterality: N/A;   CARDIAC CATHETERIZATION N/A 06/14/2016   Procedure: Left Heart Cath and Cors/Grafts Angiography;  Surgeon: Lorretta Harp, MD;  Location: [place] CV LAB;  Service: Cardiovascular;  Laterality: N/A;   CARDIAC CATHETERIZATION N/A 09/08/2016   Procedure: Left Heart Cath and Cors/Grafts Angiography;  Surgeon: Wellington Hampshire, MD;  Location: [place] CV LAB;  Service: Cardiovascular;  Laterality: N/A;   CARDIAC CATHETERIZATION     CORONARY ARTERY BYPASS GRAFT N/A 11/28/2015   Procedure: CORONARY ARTERY BYPASS GRAFTING (CABG) TIMES FIVE USING LEFT INTERNAL MAMMARY ARTERY AND RIGHT GREATER SAPHENOUS,VIEN HARVEATED BY ENDOVIEN, INTRAOPPRATIVE TEE;  Surgeon: Gaye Pollack, MD;  Location: [place];  Service: Open Heart Surgery;  Laterality: N/A;   CORONARY STENT INTERVENTION N/A 10/05/2017   Procedure: CORONARY STENT INTERVENTION;  Surgeon: [name], MD;  Location: [place] CV LAB;  Service: Cardiovascular;  Laterality: N/A;   HUMERUS  SURGERY Right 1969   "tumor inside bone; filled it w/bone chips"   LEFT HEART CATH AND CORS/GRAFTS ANGIOGRAPHY N/A 03/11/2017   Procedure: Left Heart Cath and Cors/Grafts Angiography;  Surgeon: Leonie Man, MD;  Location: [place] CV LAB;  Service: Cardiovascular;  Laterality: N/A;   LEFT HEART CATH AND CORS/GRAFTS ANGIOGRAPHY N/A 10/05/2017   Procedure: LEFT HEART CATH AND CORS/GRAFTS ANGIOGRAPHY;  Surgeon: [name], MD;  Location: [place] CV LAB;  Service: Cardiovascular;  Laterality: N/A;   PERIPHERAL VASCULAR CATHETERIZATION N/A 06/14/2016   Procedure: Lower Extremity Angiography;  Surgeon: Lorretta Harp, MD;  Location: [place] CV LAB;  Service: Cardiovascular;  Laterality: N/A;        Home Medications    Prior to Admission medications   Medication Sig Start Date End Date Taking? Authorizing Provider  acetaminophen (TYLENOL) 500 MG tablet Take 1,000 mg by mouth every 6 (six) hours as needed for mild pain or headache.  [provider]  amLODipine (NORVASC) 10 MG tablet TAKE 1 TABLET BY MOUTH DAILY Patient taking differently: Take 10 mg by mouth daily.  08/07/18   [name], MD  aspirin 81 MG tablet Take 1 tablet (81 mg total) by mouth daily. 10/24/18   Costella, Vista Mink, PA-C  atorvastatin (LIPITOR) 80 MG tablet TAKE 1 TABLET(80 MG) BY MOUTH DAILY Patient taking differently: Take 80 mg by mouth daily.  09/21/18   [name], MD  chlordiazePOXIDE (LIBRIUM) 10 MG capsule Take 10-20 mg by mouth at bedtime as needed for anxiety.     [provider]  clopidogrel (PLAVIX) 75 MG tablet Take 1 tablet (75 mg total) by mouth daily. 10/24/18   Costella, Vista Mink, PA-C  lisinopril (PRINIVIL,ZESTRIL) 5 MG tablet Take 5 mg by mouth daily. May take 1 additional tablet once daily for SBP greater than 150 once hour after taking daily dose Patient taking differently: Take 5 mg by mouth See admin instructions. Take 5 mg by mouth daily.  May take 1 additional tablet once daily for SBP greater than 150 once hour after taking daily dose 01/18/18   [name], MD  methocarbamol (ROBAXIN-750) 750 MG tablet Take 1 tablet (750 mg total) by mouth 3 (three) times daily as needed for muscle spasms. 10/18/18   Costella, Vista Mink, PA-C  metoprolol succinate (TOPROL-XL) 25 MG 24 hr tablet Take 0.5 tablets (12.5 mg total) by mouth daily. Take with or immediately following a meal. 01/23/19   [name], MD  Multiple Vitamins-Minerals (ONE-A-DAY MENS 50+ ADVANTAGE PO) Take 1 tablet by mouth daily.     [provider]  nitroGLYCERIN (NITROSTAT) 0.4 MG SL tablet PLACE 1 TABLET UNDER THE TONGUE EVERY 5 MINUTES AS NEEDED FOR CHEST PAIN. 3 DOSES MAX Patient taking differently: Place 0.4 mg under the tongue every 5 (five) minutes as needed for chest pain.  08/08/18   Strader, Fransisco Hertz, PA-C  oxyCODONE-acetaminophen (PERCOCET) 7.5-325 MG tablet Take 1 tablet by mouth every 4 (four) hours as needed. 10/18/18   Costella, Vista Mink, PA-C  pantoprazole (PROTONIX) 40 MG tablet Take 40 mg by mouth daily.    [provider]  ranitidine (ZANTAC) 150 MG tablet Take 150 mg by mouth as needed for heartburn.    [provider]  ranolazine (RANEXA) 1000 MG SR tablet Take 500 mg by mouth daily.    [provider]  sucralfate (CARAFATE) 1 g tablet Take 1 tablet (1 g total) by mouth 4 (four) times daily -  with meals and at bedtime. Patient not taking: Reported on 09/28/2018 07/13/18 08/12/18  Curatolo, Adam, DO  VIIBRYD 10 MG TABS Take 10 mg by mouth daily. 06/10/18   [provider]  zolpidem (AMBIEN CR) 12.5 MG CR tablet Take 12.5 mg by mouth at bedtime as needed for sleep.  06/10/18   [provider]    Family History Family History  Problem Relation Age of Onset   Lung cancer Mother    Heart Problems Father    Heart attack Father 34   Stroke Father    Heart failure Father    Heart  attack Maternal Grandmother    Stroke Maternal Grandmother    Heart attack Paternal Uncle    Hypertension Brother    Autoimmune disease Neg Hx     Social History Social History   Tobacco Use   Smoking status: Former Smoker    Packs/day: 0.75    Years: 44.00  Pack years: 33.00    Types: Cigarettes    Last attempt to quit: 11/28/2015    Years since quitting: 3.1   Smokeless tobacco: Never Used  Substance Use Topics   Alcohol use: No    Frequency: Never    Comment: 06/21/2017 "I'll have a few drinks q couple months"   Drug use: No    Comment: 06/21/2017 "nothing since the 1980s"     Allergies   Prednisone; Tetanus toxoids; Wellbutrin [bupropion]; Morphine and related; Scallops [shellfish allergy]; and Chantix [varenicline]   Review of Systems Review of Systems  All other systems reviewed and are negative.    Physical Exam Updated Vital Signs BP 90/70 (BP Location: Right Arm)    Pulse 83    Temp 98.1 F (36.7 C) (Oral)    Resp 19    Ht 5\' 8"  (1.727 m)    Wt 86.2 kg    SpO2 94%    BMI 28.89 kg/m   Physical Exam Vitals signs and nursing note reviewed.    63 year old male, resting comfortably and in no acute distress. Vital signs are normal. Oxygen saturation is 94%, which is normal. Head is normocephalic and atraumatic. PERRLA, EOMI. Oropharynx is clear. Neck is nontender and supple without adenopathy or JVD. Back is nontender and there is no CVA tenderness. Lungs are clear without rales, wheezes, or rhonchi. Chest is nontender. Heart has regular rate and rhythm without murmur. Abdomen is soft, flat, nontender without masses or hepatosplenomegaly and peristalsis is normoactive. Extremities have no cyanosis or edema, full range of motion is present. Skin is warm and dry without rash. Neurologic: Mental status is normal, cranial nerves are intact, there are no motor or sensory deficits.  ED Treatments / Results  Labs (all labs ordered are listed, but  only abnormal results are displayed) Labs Reviewed  BASIC METABOLIC PANEL - Abnormal; Notable for the following components:      Result Value   CO2 20 (*)    Calcium 8.8 (*)    All other components within normal limits  HEPATIC FUNCTION PANEL - Abnormal; Notable for the following components:   Total Protein 6.2 (*)    Bilirubin, Direct 0.3 (*)    All other components within normal limits  NOVEL CORONAVIRUS, NAA (HOSPITAL ORDER, SEND-OUT TO REF LAB)  CBC  TROPONIN I  TROPONIN I    EKG EKG Interpretation  Date/Time:  Wednesday February 07 2019 00:12:14 EDT Ventricular Rate:  86 PR Interval:    QRS Duration: 88 QT Interval:  377 QTC Calculation: 451 R Axis:   71 Text Interpretation:  Sinus rhythm Probable left atrial enlargement Low voltage, precordial leads When compared with ECG of 09/28/2018, No significant change was found Confirmed by Delora Fuel ([id number]) on 02/07/2019 12:16:43 AM   Radiology Dg Chest Port 1 View  Result Date: 02/07/2019 CLINICAL DATA:  Cough EXAM: PORTABLE CHEST 1 VIEW COMPARISON:  Chest x-ray dated 09/28/2018. FINDINGS: The heart size is mildly enlarged. The patient is status post prior median sternotomy. The patient is status post prior ACDF of the lower cervical spine. There is no fracture. There is no pneumothorax. There is no large focal area of consolidation. No large pleural effusion. There are few stable dense retrocardiac nodular opacities. IMPRESSION: No active disease. Electronically Signed   By: Constance Holster M.D.   On: 02/07/2019 01:01    Procedures Procedures   Medications Ordered in ED Medications  sodium chloride flush (NS) 0.9 % injection 3 mL (3  mLs Intravenous Given 02/07/19 0021)  sodium chloride 0.9 % bolus 1,000 mL (1,000 mLs Intravenous New Bag/Given 02/07/19 0034)  metoCLOPramide (REGLAN) injection 10 mg (10 mg Intravenous Given 02/07/19 0036)  diphenhydrAMINE (BENADRYL) injection 25 mg (25 mg Intravenous Given 02/07/19 0034)      Initial Impression / Assessment and Plan / ED Course  I have reviewed the triage vital signs and the nursing notes.  Pertinent labs & imaging results that were available during my care of the patient were reviewed by me and considered in my medical decision making (see chart for details).  Influenza-like illness, possible COVID-19.  Chest pain which seems to be part of his viral illness, doubt ACS.  ECG shows no acute changes.  However, will get troponin and repeat troponin.  Will check chest x-ray.  We will give IV fluids, metoclopramide.  Old records are reviewed, and it is noted that he does have a prior ED visit for migraine.  He feels much better after fluids and metoclopramide.  Chest x-ray is normal.  Hepatic function studies are normal.  Delta troponin is normal.  Patient is advised to self quarantine until COVID-19 test is back.  If positive, he will need to continue quarantine for a total of 2 weeks.  Advised to return if he develops dyspnea or confusion, or if any concerning symptoms develop.  [name] was evaluated in Emergency Department on 02/07/2019 for the symptoms described in the history of present illness. He was evaluated in the context of the global COVID-19 pandemic, which necessitated consideration that the patient might be at risk for infection with the SARS-CoV-2 virus that causes COVID-19. Institutional protocols and algorithms that pertain to the evaluation of patients at risk for COVID-19 are in a state of rapid change based on information released by regulatory bodies including the CDC and federal and state organizations. These policies and algorithms were followed during the patient's care in the ED.  Final Clinical Impressions(s) / ED Diagnoses   Final diagnoses:  Influenza-like illness    ED Discharge Orders    None       Delora Fuel, MD 49/82/64 [phone]

## 2019-02-07 NOTE — ED Notes (Signed)
Pt report epigastric pain rating 4 on scale 0-10. Pt states feeling nauseas Pt. States waking up with pain from sleep. Pt denies SOB.

## 2019-02-07 NOTE — ED Notes (Signed)
Pt. Called out for update. Pt states "i'm feeling very anxious" and wants to go home. Pt informed of repeat blood work at 3:30 am

## 2019-02-08 LAB — NOVEL CORONAVIRUS, NAA (HOSP ORDER, SEND-OUT TO REF LAB; TAT 18-24 HRS): SARS-CoV-2, NAA: NOT DETECTED

## 2019-02-19 ENCOUNTER — Other Ambulatory Visit: Payer: Self-pay

## 2019-02-23 ENCOUNTER — Telehealth: Payer: Self-pay | Admitting: *Deleted

## 2019-02-23 NOTE — Telephone Encounter (Signed)

## 2019-02-26 ENCOUNTER — Inpatient Hospital Stay: Admission: RE | Admit: 2019-02-26 | Payer: Self-pay | Source: Ambulatory Visit

## 2019-02-26 ENCOUNTER — Other Ambulatory Visit: Payer: Self-pay

## 2019-03-07 ENCOUNTER — Other Ambulatory Visit: Payer: Self-pay | Admitting: Interventional Cardiology

## 2019-03-08 ENCOUNTER — Other Ambulatory Visit: Payer: Self-pay | Admitting: Physician Assistant

## 2019-03-12 ENCOUNTER — Other Ambulatory Visit: Payer: Self-pay | Admitting: Interventional Cardiology

## 2019-03-12 MED ORDER — LISINOPRIL 5 MG PO TABS: ORAL_TABLET | ORAL | 2 refills | Status: DC

## 2019-03-26 ENCOUNTER — Telehealth: Payer: Self-pay | Admitting: *Deleted

## 2019-03-26 NOTE — Telephone Encounter (Signed)
Chart indicates he is a former smoker. CT in 01/2018 demonstrated small 63mm nodule.  Recommendation at that time was to have a follow up CT in 1 year.   However, there is a CT in 08/2018 that does not mention a nodule. I will have radiologist look at this for me.  If it is truly resolved, he will probably not need a repeat.  I will see what the radiologist says. [name], PA-C    03/26/2019 5:15 PM

## 2019-03-26 NOTE — Telephone Encounter (Signed)
Received call from CT dept.  Pt had been ordered to have a CT scan to f/u pulmonary nodules and needs to cancel at this time.  Pt is also questioning the necessity of this CT scan.    CT was ordered during the 02/24/2018 appt with [name], PA (see the following note) There was a 2 mm nodular opacity in the anterior segment left upper lobe.  Follow-up noncontrast chest CT was recommended in 12 months.  Will send to [facility] and Dr Irish Lack to determine need for f/u.

## 2019-03-28 ENCOUNTER — Inpatient Hospital Stay: Admission: RE | Admit: 2019-03-28 | Payer: BC Managed Care – PPO | Source: Ambulatory Visit

## 2019-03-29 NOTE — Telephone Encounter (Signed)
Spoke with the pt and informed him of [name] PA-C recommendations about his CT.  Endorsed to the pt that per Nicki Reaper, he spoke with a Radiologist at [facility] whom reviewed his CT back in Jan 2020, and there is no evidence of a nodule on that study, therefore he does NOT need a repeat CT.  Endorsed to the pt that Nicki Reaper stated that the one year CT that was ordered can be cancelled.  Cancelled the one year CT order out of the pts "active request" in his appt desk.  There is no actual appt made at this time, only an order.  Pt cancelled the CT anyway's on 7/29. Pt verbalized understanding and agrees with this plan. Pt was more than gracious for all the assistance provided.

## 2019-03-29 NOTE — Telephone Encounter (Signed)
I spoke to one of the radiologists at [facility] who reviewed the CT in Jan 2020.  There is no evidence of a nodule on that study. Therefore he does not need a repeat CT.  The 1 year follow up CT that was ordered for now can be cancelled.  [name], PA-C    03/29/2019 10:08 AM

## 2019-04-11 ENCOUNTER — Encounter (HOSPITAL_COMMUNITY)
Admission: EM | Disposition: A | Discharge status: Home / Self Care | Payer: Self-pay | Source: Home / Self Care | Attending: Emergency Medicine

## 2019-04-11 ENCOUNTER — Observation Stay (HOSPITAL_BASED_OUTPATIENT_CLINIC_OR_DEPARTMENT_OTHER)
Admission: EM | Admit: 2019-04-11 | Discharge: 2019-04-12 | Disposition: A | Discharge status: Home / Self Care | Payer: BC Managed Care – PPO | Attending: Cardiology | Admitting: Cardiology

## 2019-04-11 ENCOUNTER — Emergency Department (HOSPITAL_BASED_OUTPATIENT_CLINIC_OR_DEPARTMENT_OTHER): Payer: BC Managed Care – PPO

## 2019-04-11 ENCOUNTER — Encounter (HOSPITAL_BASED_OUTPATIENT_CLINIC_OR_DEPARTMENT_OTHER): Payer: Self-pay | Admitting: Emergency Medicine

## 2019-04-11 ENCOUNTER — Other Ambulatory Visit: Payer: Self-pay

## 2019-04-11 DIAGNOSIS — I255 Ischemic cardiomyopathy: Secondary | ICD-10-CM | POA: Insufficient documentation

## 2019-04-11 DIAGNOSIS — Z8673 Personal history of transient ischemic attack (TIA), and cerebral infarction without residual deficits: Secondary | ICD-10-CM | POA: Diagnosis not present

## 2019-04-11 DIAGNOSIS — F329 Major depressive disorder, single episode, unspecified: Secondary | ICD-10-CM | POA: Diagnosis not present

## 2019-04-11 DIAGNOSIS — I252 Old myocardial infarction: Secondary | ICD-10-CM | POA: Insufficient documentation

## 2019-04-11 DIAGNOSIS — Z7982 Long term (current) use of aspirin: Secondary | ICD-10-CM | POA: Diagnosis not present

## 2019-04-11 DIAGNOSIS — E7800 Pure hypercholesterolemia, unspecified: Secondary | ICD-10-CM | POA: Diagnosis not present

## 2019-04-11 DIAGNOSIS — Z1159 Encounter for screening for other viral diseases: Secondary | ICD-10-CM | POA: Insufficient documentation

## 2019-04-11 DIAGNOSIS — I25110 Atherosclerotic heart disease of native coronary artery with unstable angina pectoris: Principal | ICD-10-CM | POA: Insufficient documentation

## 2019-04-11 DIAGNOSIS — E785 Hyperlipidemia, unspecified: Secondary | ICD-10-CM | POA: Insufficient documentation

## 2019-04-11 DIAGNOSIS — R072 Precordial pain: Secondary | ICD-10-CM

## 2019-04-11 DIAGNOSIS — Z87891 Personal history of nicotine dependence: Secondary | ICD-10-CM | POA: Insufficient documentation

## 2019-04-11 DIAGNOSIS — F419 Anxiety disorder, unspecified: Secondary | ICD-10-CM | POA: Diagnosis not present

## 2019-04-11 DIAGNOSIS — Z885 Allergy status to narcotic agent status: Secondary | ICD-10-CM | POA: Diagnosis not present

## 2019-04-11 DIAGNOSIS — Z888 Allergy status to other drugs, medicaments and biological substances status: Secondary | ICD-10-CM | POA: Insufficient documentation

## 2019-04-11 DIAGNOSIS — I25710 Atherosclerosis of autologous vein coronary artery bypass graft(s) with unstable angina pectoris: Secondary | ICD-10-CM | POA: Diagnosis not present

## 2019-04-11 DIAGNOSIS — Z7902 Long term (current) use of antithrombotics/antiplatelets: Secondary | ICD-10-CM | POA: Diagnosis not present

## 2019-04-11 DIAGNOSIS — I200 Unstable angina: Secondary | ICD-10-CM | POA: Diagnosis not present

## 2019-04-11 DIAGNOSIS — Z951 Presence of aortocoronary bypass graft: Secondary | ICD-10-CM | POA: Insufficient documentation

## 2019-04-11 DIAGNOSIS — I25709 Atherosclerosis of coronary artery bypass graft(s), unspecified, with unspecified angina pectoris: Secondary | ICD-10-CM

## 2019-04-11 DIAGNOSIS — K5730 Diverticulosis of large intestine without perforation or abscess without bleeding: Secondary | ICD-10-CM | POA: Diagnosis not present

## 2019-04-11 DIAGNOSIS — Z79899 Other long term (current) drug therapy: Secondary | ICD-10-CM | POA: Diagnosis not present

## 2019-04-11 DIAGNOSIS — I119 Hypertensive heart disease without heart failure: Secondary | ICD-10-CM | POA: Diagnosis not present

## 2019-04-11 DIAGNOSIS — K219 Gastro-esophageal reflux disease without esophagitis: Secondary | ICD-10-CM | POA: Diagnosis not present

## 2019-04-11 HISTORY — PX: LEFT HEART CATH AND CORS/GRAFTS ANGIOGRAPHY: CATH118250

## 2019-04-11 LAB — TROPONIN I (HIGH SENSITIVITY)
Troponin I (High Sensitivity): 3 ng/L (ref <18)
Troponin I (High Sensitivity): 3 ng/L (ref <18)

## 2019-04-11 LAB — BASIC METABOLIC PANEL
Anion gap: 8 (ref 5–15)
BUN: 26 mg/dL — ABNORMAL HIGH (ref 8–23)
CO2: 27 mmol/L (ref 22–32)
Calcium: 9.4 mg/dL (ref 8.9–10.3)
Chloride: 105 mmol/L (ref 98–111)
Creatinine, Ser: 1.18 mg/dL (ref 0.61–1.24)
GFR calc Af Amer: >60 mL/min (ref >60)
GFR calc non Af Amer: >60 mL/min (ref >60)
Glucose, Bld: 108 mg/dL — ABNORMAL HIGH (ref 70–99)
Potassium: 3.9 mmol/L (ref 3.5–5.1)
Sodium: 140 mmol/L (ref 135–145)

## 2019-04-11 LAB — HEPATIC FUNCTION PANEL
ALT: 31 U/L (ref 0–44)
AST: 28 U/L (ref 15–41)
Albumin: 4.3 g/dL (ref 3.5–5.0)
Alkaline Phosphatase: 56 U/L (ref 38–126)
Bilirubin, Direct: 0.1 mg/dL (ref 0.0–0.2)
Indirect Bilirubin: 0.8 mg/dL (ref 0.3–0.9)
Total Bilirubin: 0.9 mg/dL (ref 0.3–1.2)
Total Protein: 7.1 g/dL (ref 6.5–8.1)

## 2019-04-11 LAB — CBC
HCT: 43.4 % (ref 39.0–52.0)
Hemoglobin: 14 g/dL (ref 13.0–17.0)
MCH: 29.9 pg (ref 26.0–34.0)
MCHC: 32.3 g/dL (ref 30.0–36.0)
MCV: 92.5 fL (ref 80.0–100.0)
Platelets: 229 10*3/uL (ref 150–400)
RBC: 4.69 MIL/uL (ref 4.22–5.81)
RDW: 12.9 % (ref 11.5–15.5)
WBC: 9.7 10*3/uL (ref 4.0–10.5)
nRBC: 0 % (ref 0.0–0.2)

## 2019-04-11 LAB — LIPASE, BLOOD: Lipase: 28 U/L (ref 11–51)

## 2019-04-11 LAB — SARS CORONAVIRUS 2 BY RT PCR (HOSPITAL ORDER, PERFORMED IN ~~LOC~~ HOSPITAL LAB): SARS Coronavirus 2: NEGATIVE

## 2019-04-11 SURGERY — LEFT HEART CATH AND CORS/GRAFTS ANGIOGRAPHY
Anesthesia: LOCAL

## 2019-04-11 MED ORDER — SODIUM CHLORIDE 0.9 % IV SOLN: 250.0000 mL | INTRAVENOUS | Status: DC | PRN

## 2019-04-11 MED ORDER — SODIUM CHLORIDE 0.9% FLUSH: 3.0000 mL | INTRAVENOUS | Status: DC | PRN

## 2019-04-11 MED ORDER — SODIUM CHLORIDE 0.9 % WEIGHT BASED INFUSION: 3.0000 mL/kg/h | INTRAVENOUS | Status: DC

## 2019-04-11 MED ORDER — FAMOTIDINE 20 MG PO TABS
20.0000 mg | ORAL_TABLET | Freq: Every day | ORAL | Status: DC
  Administered 2019-04-12: 20 mg via ORAL
  Filled 2019-04-11 (×2): qty 1

## 2019-04-11 MED ORDER — FENTANYL CITRATE (PF) 100 MCG/2ML IJ SOLN
50.0000 ug | Freq: Once | INTRAMUSCULAR | Status: AC
  Administered 2019-04-11: 50 ug via INTRAVENOUS
  Filled 2019-04-11: qty 2

## 2019-04-11 MED ORDER — SODIUM CHLORIDE 0.9% FLUSH
3.0000 mL | Freq: Two times a day (BID) | INTRAVENOUS | Status: DC
  Administered 2019-04-12: 3 mL via INTRAVENOUS

## 2019-04-11 MED ORDER — IOHEXOL 350 MG/ML SOLN
100.0000 mL | Freq: Once | INTRAVENOUS | Status: AC | PRN
  Administered 2019-04-11: 100 mL via INTRAVENOUS

## 2019-04-11 MED ORDER — LIDOCAINE HCL (PF) 1 % IJ SOLN
INTRAMUSCULAR | Status: AC
  Filled 2019-04-11: qty 30

## 2019-04-11 MED ORDER — HYDRALAZINE HCL 20 MG/ML IJ SOLN: 10.0000 mg | INTRAMUSCULAR | Status: AC | PRN

## 2019-04-11 MED ORDER — LISINOPRIL 5 MG PO TABS
5.0000 mg | ORAL_TABLET | Freq: Every day | ORAL | Status: DC
  Administered 2019-04-12: 5 mg via ORAL
  Filled 2019-04-11: qty 1

## 2019-04-11 MED ORDER — LABETALOL HCL 5 MG/ML IV SOLN: 10.0000 mg | INTRAVENOUS | Status: AC | PRN

## 2019-04-11 MED ORDER — HEPARIN (PORCINE) IN NACL 1000-0.9 UT/500ML-% IV SOLN
INTRAVENOUS | Status: DC | PRN
  Administered 2019-04-11: 500 mL

## 2019-04-11 MED ORDER — NITROGLYCERIN 0.4 MG SL SUBL
0.4000 mg | SUBLINGUAL_TABLET | SUBLINGUAL | Status: DC | PRN
  Administered 2019-04-11 (×3): 0.4 mg via SUBLINGUAL
  Filled 2019-04-11: qty 1

## 2019-04-11 MED ORDER — SODIUM CHLORIDE 0.9 % WEIGHT BASED INFUSION: 1.0000 mL/kg/h | INTRAVENOUS | Status: DC

## 2019-04-11 MED ORDER — HEPARIN SODIUM (PORCINE) 5000 UNIT/ML IJ SOLN
4000.0000 [IU] | Freq: Once | INTRAMUSCULAR | Status: DC
  Filled 2019-04-11: qty 1

## 2019-04-11 MED ORDER — CLOPIDOGREL BISULFATE 75 MG PO TABS
75.0000 mg | ORAL_TABLET | Freq: Every day | ORAL | Status: DC
  Administered 2019-04-11 – 2019-04-12 (×2): 75 mg via ORAL
  Filled 2019-04-11 (×2): qty 1

## 2019-04-11 MED ORDER — ACETAMINOPHEN 325 MG PO TABS
650.0000 mg | ORAL_TABLET | ORAL | Status: DC | PRN
  Administered 2019-04-11 – 2019-04-12 (×2): 650 mg via ORAL
  Filled 2019-04-11 (×2): qty 2

## 2019-04-11 MED ORDER — SODIUM CHLORIDE 0.9 % IV SOLN: INTRAVENOUS | Status: AC

## 2019-04-11 MED ORDER — HEPARIN (PORCINE) IN NACL 1000-0.9 UT/500ML-% IV SOLN
INTRAVENOUS | Status: AC
  Filled 2019-04-11: qty 1000

## 2019-04-11 MED ORDER — FENTANYL CITRATE (PF) 100 MCG/2ML IJ SOLN
INTRAMUSCULAR | Status: AC
  Filled 2019-04-11: qty 2

## 2019-04-11 MED ORDER — ONDANSETRON HCL 4 MG/2ML IJ SOLN
4.0000 mg | Freq: Once | INTRAMUSCULAR | Status: AC
  Administered 2019-04-11: 4 mg via INTRAVENOUS
  Filled 2019-04-11: qty 2

## 2019-04-11 MED ORDER — MIDAZOLAM HCL 2 MG/2ML IJ SOLN
INTRAMUSCULAR | Status: AC
  Filled 2019-04-11: qty 2

## 2019-04-11 MED ORDER — ASPIRIN 81 MG PO CHEW
324.0000 mg | CHEWABLE_TABLET | Freq: Once | ORAL | Status: AC
  Administered 2019-04-11: 324 mg via ORAL
  Filled 2019-04-11: qty 4

## 2019-04-11 MED ORDER — AMLODIPINE BESYLATE 10 MG PO TABS
10.0000 mg | ORAL_TABLET | Freq: Every day | ORAL | Status: DC
  Administered 2019-04-12: 10 mg via ORAL
  Filled 2019-04-11: qty 1

## 2019-04-11 MED ORDER — LIDOCAINE HCL (PF) 1 % IJ SOLN
INTRAMUSCULAR | Status: DC | PRN
  Administered 2019-04-11: 10 mL via SUBCUTANEOUS
  Administered 2019-04-11: 5 mL via SUBCUTANEOUS

## 2019-04-11 MED ORDER — HEPARIN BOLUS VIA INFUSION
4000.0000 [IU] | Freq: Once | INTRAVENOUS | Status: AC
  Administered 2019-04-11: 4000 [IU] via INTRAVENOUS

## 2019-04-11 MED ORDER — MIDAZOLAM HCL 2 MG/2ML IJ SOLN
INTRAMUSCULAR | Status: DC | PRN
  Administered 2019-04-11: 1 mg via INTRAVENOUS
  Administered 2019-04-11: 2 mg via INTRAVENOUS

## 2019-04-11 MED ORDER — PANTOPRAZOLE SODIUM 40 MG PO TBEC
40.0000 mg | DELAYED_RELEASE_TABLET | Freq: Every day | ORAL | Status: DC
  Administered 2019-04-11 – 2019-04-12 (×2): 40 mg via ORAL
  Filled 2019-04-11 (×2): qty 1

## 2019-04-11 MED ORDER — HEPARIN (PORCINE) IN NACL 1000-0.9 UT/500ML-% IV SOLN
INTRAVENOUS | Status: AC
  Filled 2019-04-11: qty 500

## 2019-04-11 MED ORDER — HEPARIN (PORCINE) IN NACL 1000-0.9 UT/500ML-% IV SOLN
INTRAVENOUS | Status: DC | PRN
  Administered 2019-04-11 (×2): 500 mL

## 2019-04-11 MED ORDER — FENTANYL CITRATE (PF) 100 MCG/2ML IJ SOLN
INTRAMUSCULAR | Status: DC | PRN
  Administered 2019-04-11 (×2): 25 ug via INTRAVENOUS

## 2019-04-11 MED ORDER — ASPIRIN EC 81 MG PO TBEC
81.0000 mg | DELAYED_RELEASE_TABLET | Freq: Every day | ORAL | Status: DC
  Administered 2019-04-12: 81 mg via ORAL
  Filled 2019-04-11: qty 1

## 2019-04-11 MED ORDER — METOPROLOL SUCCINATE ER 25 MG PO TB24
12.5000 mg | ORAL_TABLET | Freq: Every day | ORAL | Status: DC
  Administered 2019-04-12: 12.5 mg via ORAL
  Filled 2019-04-11: qty 1

## 2019-04-11 MED ORDER — HEPARIN (PORCINE) 25000 UT/250ML-% IV SOLN
1100.0000 [IU]/h | INTRAVENOUS | Status: DC
  Administered 2019-04-11: 1100 [IU]/h via INTRAVENOUS
  Filled 2019-04-11: qty 250

## 2019-04-11 MED ORDER — ATORVASTATIN CALCIUM 80 MG PO TABS
80.0000 mg | ORAL_TABLET | Freq: Every day | ORAL | Status: DC
  Administered 2019-04-11: 80 mg via ORAL
  Filled 2019-04-11: qty 1

## 2019-04-11 MED ORDER — ONDANSETRON HCL 4 MG/2ML IJ SOLN: 4.0000 mg | Freq: Four times a day (QID) | INTRAMUSCULAR | Status: DC | PRN

## 2019-04-11 MED ORDER — ALUM & MAG HYDROXIDE-SIMETH 200-200-20 MG/5ML PO SUSP
30.0000 mL | Freq: Once | ORAL | Status: AC
  Administered 2019-04-11: 30 mL via ORAL
  Filled 2019-04-11: qty 30

## 2019-04-11 MED ORDER — CLOPIDOGREL BISULFATE 75 MG PO TABS: 75.0000 mg | ORAL_TABLET | Freq: Every day | ORAL | Status: DC

## 2019-04-11 MED ORDER — IOHEXOL 350 MG/ML SOLN
INTRAVENOUS | Status: DC | PRN
  Administered 2019-04-11: 75 mL via INTRACARDIAC

## 2019-04-11 MED ORDER — RANOLAZINE ER 500 MG PO TB12
1000.0000 mg | ORAL_TABLET | Freq: Two times a day (BID) | ORAL | Status: DC
  Administered 2019-04-11 – 2019-04-12 (×2): 1000 mg via ORAL
  Filled 2019-04-11 (×2): qty 2

## 2019-04-11 MED ORDER — METHOCARBAMOL 750 MG PO TABS: 750.0000 mg | ORAL_TABLET | Freq: Three times a day (TID) | ORAL | Status: DC | PRN

## 2019-04-11 SURGICAL SUPPLY — 8 items
CATH INFINITI 5FR MULTPACK ANG (CATHETERS) ×2 IMPLANT
KIT HEART LEFT (KITS) ×2 IMPLANT
PACK CARDIAC CATHETERIZATION (CUSTOM PROCEDURE TRAY) ×2 IMPLANT
SHEATH PINNACLE 5F 10CM (SHEATH) ×2 IMPLANT
SHEATH PROBE COVER 6X72 (BAG) ×2 IMPLANT
TRANSDUCER W/STOPCOCK (MISCELLANEOUS) ×2 IMPLANT
TUBING CIL FLEX 10 FLL-RA (TUBING) ×2 IMPLANT
WIRE EMERALD 3MM-J .035X150CM (WIRE) ×2 IMPLANT

## 2019-04-11 NOTE — H&P (Signed)
Cardiology Admission History and Physical:   Patient ID: [name] MRN: [id number]; DOB: Feb 13, 1956   Admission date: 04/11/2019  Primary Care Provider: [name], MD Primary Cardiologist: Larae Grooms, MD  Primary Electrophysiologist:  None   Chief Complaint:  Chest pain  Patient Profile:   [name] is a 63 y.o. male with past medical history of CAD status post anterior MI 10/2015 treated with emergent CABG, subsequent PCI, hypertension, hyperlipidemia, ischemic cardiomyopathy, stroke who presented with chest pain.  History of Present Illness:   [name] is a 63 year old male with past medical history noted above.  Presented initially and March 2017 with an anterior MI and ultimately underwent emergency CABG.  Subsequent cath with occluded SVG to diagonal and moderate disease in the SVG to RCA, and had native RCA DES placed in February 2019.  Noted to have ischemic cardiomyopathy after his initial CABG with EF down to 20-25%. Improved to 55-60% on echo from 1/20.  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  Seen in the office by [name] on 02/24/2018 and increased his Ranexa to thousand milligrams twice a day and metoprolol 200 mg daily.  Seen 02/2018 for presyncope that was not felt to be cardiac in origin.  Presented to the ER 11/19 with palpitations and heart rate in the 120s.  Also had atypical chest pain which anxiety was felt to be a component of.  Did have relief with narcotics.  Had an event monitor placed noting normal sinus rhythm with occasional sinus bradycardia.  No atrial fibrillation noted.   Presented back to the ED 09/28/2018 for atypical chest pain that was reproducible.  2D echocardiogram at that time showed EF of 55 to 60% with no valvular problems.  CTA showed no evidence of aortic dissection.  He is seen in the office on 10/04/2018 to be scheduled for cardiac clearance for a C7-T1 anterior cervical fusion to be done by Dr.  Christella Noa.  He was cleared for surgery and underwent ACDF of C7-T1 on 10/17/2018.   Reports since having his back surgery he has done well.  Did hold his Plavix for about 2 weeks during that time.  He has been fairly active, working regularly and has not had any exertional symptoms.  States last night he woke up and felt centralized chest pressure/burning.  Felt it may have been related to indigestion/acid reflux and was able to go back to bed.  States he woke up early this morning and took the dogs out when he developed recurrence of the same chest pain.  Symptoms persisted and actually worsened whenever he walked up the stairs.  Pain started to radiate down his left arm.  He did take 1 sublingual nitroglycerin which did not improve his symptoms therefore took a second.  Became concerned when symptoms persisted and have his wife bring him to the ED at [facility] for further evaluation.  There his labs showed stable electrolytes, creatinine 1.1, high-sensitivity troponin 3>> 3, hemoglobin 14.  EKG shows sinus rhythm with no acute ST/T wave abnormalities.  He was given 3 aspirin, and a total of 150 mcgs of fentanyl, and placed on IV heparin. On arrival still with 5/10 chest discomfort.   Heart Pathway Score:     Past Medical History:  Diagnosis Date   Anxiety    Arthritis    Basal cell carcinoma (BCC) of forehead    CAD (coronary artery disease)    a. 10/2015 ant STEMI >> LHC  with 3 v CAD; oLAD tx with POBA >> emergent CABG. b. Multiple evals since that time, early graft failure of SVG-RCA by cath 03/2016. c. 2/19 PCI/DES x1 to pRCA, normal EF.   Carotid artery disease ([place])    a. 40-59% BICA 02/2018.   Depression    Dyspnea    Ectopic atrial tachycardia (HCC)    Esophageal reflux    eosinophil esophagitis   Family history of adverse reaction to anesthesia    "sister has PONV" (06/21/2017)   Former tobacco use    Gout    Hepatitis C    "treated and cured" (06/21/2017)    High cholesterol    History of kidney stones    Hypertension    Ischemic cardiomyopathy    a. EF 25-30% at intraop TEE 4/17  //  b. Limited Echo 5/17 - EF 45-50%, mild ant HK. c. EF 55-65% by cath 09/2017.   Migraine    "3-4/yr" (06/21/2017)   Myocardial infarction ([place]) 10/2015   Palpitations    Sinus bradycardia    a. HR dropping into 40s in 02/2016 -> BB reduced.   Stroke [facility]) 10/2016   "small one; sometimes my memory/cognitive issues" (06/21/2017)   Symptomatic hypotension    a. 02/2016 ER visit -> meds reduced.   Syncope     Past Surgical History:  Procedure Laterality Date   ANTERIOR CERVICAL DECOMP/DISCECTOMY FUSION N/A 10/17/2018   Procedure: Anterior Cervical Decompression Fusion - Cervical seven -Thoracic one;  Surgeon: Consuella Lose, MD;  Location: [place];  Service: Neurosurgery;  Laterality: N/A;   BASAL CELL CARCINOMA EXCISION     "forehead   CARDIAC CATHETERIZATION N/A 11/28/2015   Procedure: Left Heart Cath and Coronary Angiography;  Surgeon: [name], MD;  Location: [place] [facility];  Service: Cardiovascular;  Laterality: N/A;   CARDIAC CATHETERIZATION N/A 11/28/2015   Procedure: Coronary Balloon Angioplasty;  Surgeon: [name], MD;  Location: [place] [facility];  Service: Cardiovascular;  Laterality: N/A;  ostial LAD   CARDIAC CATHETERIZATION N/A 11/28/2015   Procedure: Coronary/Graft Angiography;  Surgeon: [name], MD;  Location: [place] [facility];  Service: Cardiovascular;  Laterality: N/A;  coronaries only    CARDIAC CATHETERIZATION N/A 04/21/2016   Procedure: Left Heart Cath and Coronary Angiography;  Surgeon: Wellington Hampshire, MD;  Location: [place] [facility];  Service: Cardiovascular;  Laterality: N/A;   CARDIAC CATHETERIZATION N/A 06/14/2016   Procedure: Left Heart Cath and Cors/Grafts Angiography;  Surgeon: Lorretta Harp, MD;  Location: [place] [facility];  Service: Cardiovascular;  Laterality: N/A;    CARDIAC CATHETERIZATION N/A 09/08/2016   Procedure: Left Heart Cath and Cors/Grafts Angiography;  Surgeon: Wellington Hampshire, MD;  Location: [place] [facility];  Service: Cardiovascular;  Laterality: N/A;   CARDIAC CATHETERIZATION     CORONARY ARTERY BYPASS GRAFT N/A 11/28/2015   Procedure: CORONARY ARTERY BYPASS GRAFTING (CABG) TIMES FIVE USING LEFT INTERNAL MAMMARY ARTERY AND RIGHT GREATER SAPHENOUS,VIEN HARVEATED BY ENDOVIEN, INTRAOPPRATIVE TEE;  Surgeon: Gaye Pollack, MD;  Location: [place];  Service: Open Heart Surgery;  Laterality: N/A;   CORONARY STENT INTERVENTION N/A 10/05/2017   Procedure: CORONARY STENT INTERVENTION;  Surgeon: [name], MD;  Location: [place] [facility];  Service: Cardiovascular;  Laterality: N/A;   HUMERUS SURGERY Right 1969   "tumor inside bone; filled it w/bone chips"   LEFT HEART CATH AND CORS/GRAFTS ANGIOGRAPHY N/A 03/11/2017   Procedure: Left Heart Cath and Cors/Grafts Angiography;  Surgeon: Leonie Man, MD;  Location: [place] [facility];  Service: Cardiovascular;  Laterality: N/A;   LEFT HEART CATH AND CORS/GRAFTS ANGIOGRAPHY N/A 10/05/2017   Procedure: LEFT HEART CATH AND CORS/GRAFTS ANGIOGRAPHY;  Surgeon: [name], MD;  Location: [place] [facility];  Service: Cardiovascular;  Laterality: N/A;   PERIPHERAL VASCULAR CATHETERIZATION N/A 06/14/2016   Procedure: Lower Extremity Angiography;  Surgeon: Lorretta Harp, MD;  Location: [place] [facility];  Service: Cardiovascular;  Laterality: N/A;     Medications Prior to Admission: Prior to Admission medications   Medication Sig Start Date End Date Taking? Authorizing Provider  acetaminophen (TYLENOL) 500 MG tablet Take 1,000 mg by mouth every 6 (six) hours as needed for mild pain or headache.     [provider]  amLODipine (NORVASC) 10 MG tablet Take 1 tablet (10 mg total) by mouth daily. 03/07/19   [name], MD  aspirin 81 MG tablet Take 1 tablet (81 mg total) by  mouth daily. 10/24/18   Costella, Vista Mink, PA-C  atorvastatin (LIPITOR) 80 MG tablet TAKE 1 TABLET(80 MG) BY MOUTH DAILY Patient taking differently: Take 80 mg by mouth daily.  09/21/18   [name], MD  chlordiazePOXIDE (LIBRIUM) 10 MG capsule Take 10-20 mg by mouth at bedtime as needed for anxiety.     [provider]  clopidogrel (PLAVIX) 75 MG tablet Take 1 tablet (75 mg total) by mouth daily. 10/24/18   Costella, Vista Mink, PA-C  Doxepin HCl 6 MG TABS Take 6 mg by mouth at bedtime as needed for sleep. 02/03/19   [provider]  lisinopril (ZESTRIL) 5 MG tablet Take 5 mg by mouth daily. May take 1 additional tablet once daily for SBP greater than 150 once hour after taking daily dose 03/12/19   [name], MD  methocarbamol (ROBAXIN-750) 750 MG tablet Take 1 tablet (750 mg total) by mouth 3 (three) times daily as needed for muscle spasms. 10/18/18   Costella, Vista Mink, PA-C  metoprolol succinate (TOPROL-XL) 25 MG 24 hr tablet Take 0.5 tablets (12.5 mg total) by mouth daily. Take with or immediately following a meal. 01/23/19   [name], MD  Multiple Vitamins-Minerals (ONE-A-DAY MENS 50+ ADVANTAGE PO) Take 1 tablet by mouth daily.     [provider]  nitroGLYCERIN (NITROSTAT) 0.4 MG SL tablet PLACE 1 TABLET UNDER THE TONGUE EVERY 5 MINUTES AS NEEDED FOR CHEST PAIN. 3 DOSES MAX Patient taking differently: Place 0.4 mg under the tongue every 5 (five) minutes as needed for chest pain.  08/08/18   Strader, Fransisco Hertz, PA-C  pantoprazole (PROTONIX) 40 MG tablet Take 40 mg by mouth daily.    [provider]  ranitidine (ZANTAC) 150 MG tablet Take 150 mg by mouth as needed for heartburn.    [provider]  ranolazine (RANEXA) 1000 MG SR tablet TAKE ONE TABLET BY MOUTH TWICE DAILY 03/08/19   Kathlen Mody, Scott T, PA-C  VIIBRYD 10 MG TABS Take 10 mg by mouth daily. 06/10/18   [provider]     Allergies:    Allergies    Allergen Reactions   Prednisone Other (See Comments)    States that this med makes him "crazy"   Tetanus Toxoids Swelling and Other (See Comments)    Fever, Swelling of the arm    Wellbutrin [Bupropion] Other (See Comments)    Crazy thoughts, nightmares   Morphine And Related Hives and Itching    Redness  at the injection site   Scallops [Shellfish Allergy] Nausea Only   Chantix [Varenicline] Other (See Comments)    Dreams    Social History:   Social History   Socioeconomic History   Marital status: Married    Spouse name: [name]   Number of children: 3   Years of education: College   Highest education level: Not on file  Occupational History   Occupation: Scientist, research (physical sciences): SELF-EMPLOYED  Scientist, product/process development strain: Not on file   Food insecurity    Worry: Not on file    Inability: Not on file   Transportation needs    Medical: Not on file    Non-medical: Not on file  Tobacco Use   Smoking status: Former Smoker    Packs/day: 0.75    Years: 44.00    Pack years: 33.00    Types: Cigarettes    Quit date: 11/28/2015    Years since quitting: 3.3   Smokeless tobacco: Never Used  Substance and Sexual Activity   Alcohol use: No    Frequency: Never    Comment: 06/21/2017 "I'll have a few drinks q couple months"   Drug use: No    Comment: 06/21/2017 "nothing since the 1980s"   Sexual activity: Yes  Lifestyle   Physical activity    Days per week: Not on file    Minutes per session: Not on file   Stress: Not on file  Relationships   Social connections    Talks on phone: Not on file    Gets together: Not on file    Attends religious service: Not on file    Active member of club or organization: Not on file    Attends meetings of clubs or organizations: Not on file    Relationship status: Not on file   Intimate partner violence    Fear of current or ex partner: Not on file    Emotionally abused: Not on file    Physically  abused: Not on file    Forced sexual activity: Not on file  Other Topics Concern   Not on file  Social History Narrative   Patient lives at home with his spouse.   Caffeine Use: yes    Family History:  The patient's family history includes Heart Problems in his father; Heart attack in his maternal grandmother and paternal uncle; Heart attack (age of onset: 40) in his father; Heart failure in his father; Hypertension in his brother; Lung cancer in his mother; Stroke in his father and maternal grandmother. There is no history of Autoimmune disease.    ROS:  Please see the history of present illness.  All other ROS reviewed and negative.     Physical Exam/Data:   Vitals:   04/11/19 1100 04/11/19 1200 04/11/19 1219 04/11/19 1221  BP: 101/68 98/71 107/73 106/78  Pulse: 69 76 71 75  Resp: 19 18 14 14   Temp:      TempSrc:      SpO2: 96% 99% 97% 97%  Weight:      Height:       No intake or output data in the 24 hours ending 04/11/19 1323 Last 3 Weights 04/11/2019 02/07/2019 10/17/2018  Weight (lbs) 190 lb 190 lb 185 lb  Weight (kg) 86.183 kg 86.183 kg 83.915 kg     Body mass index is 28.89 kg/m.  General:  Well nourished, well developed, in no acute distress HEENT: normal Lymph: no adenopathy Neck: no JVD  Endocrine:  No thryomegaly Vascular: No carotid bruits Cardiac:  normal S1, S2; RRR; no murmur  Lungs:  clear to auscultation bilaterally, no wheezing, rhonchi or rales  Abd: soft, nontender, no hepatomegaly  Ext: no edema Musculoskeletal:  No deformities, BUE and BLE strength normal and equal Skin: warm and dry  Neuro:  CNs 2-12 intact, no focal abnormalities noted Psych:  Normal affect    EKG:  The ECG that was done 04/11/19 was personally reviewed and demonstrates SR without acute ST/T wave changes  Relevant CV Studies:  TTE: 09/28/18  IMPRESSIONS    1. The left ventricle has normal systolic function of 56-21%. The cavity size is normal. There is normal left  ventricular hypertrophy. Echo evidence of normal diastolic filling patterns.  2. Normal left atrial size.  3. Normal right atrial size.  4. Normal tricuspid valve.  5. Tricuspid regurgitation is mild.  6. No atrial level shunt detected by color flow Doppler.  Cath: 10/05/17   Ost LAD to Prox LAD lesion is 50% stenosed.  Mid LAD lesion is 55% stenosed. LIMA to LAD is patent.  SVG to diagonal is occluded. There is no significant obstruction in the native diagonal.  Ost Cx lesion is 80% stenosed.  Prox Cx to Mid Cx lesion is 50% stenosed. SVG to OM is patent.  Dist RCA lesion is 50% stenosed.  RPDA lesion is 40% stenosed.  Ost RCA to Prox RCA lesion is 75% stenosed. Origin to Prox Graft lesion is 70% stenosed.  A drug-eluting stent was successfully placed using a STENT SYNERGY DES 4X16, postdilated to 4.5 mm.  Post intervention, there is a 0% residual stenosis.  The left ventricular systolic function is normal.  LV end diastolic pressure is normal.  The left ventricular ejection fraction is 55-65% by visual estimate.  There is no aortic valve stenosis.   Continue dual antiplatelet therapy for at least one year and, clopidogrel likely indefinitely.  Aggressive secondary prevention.    Diagnostic Dominance: Right  Intervention     Laboratory Data:  High Sensitivity Troponin:   Recent Labs  Lab 04/11/19 0830 04/11/19 1030  TROPONINIHS 3 3      Cardiac EnzymesNo results for input(s): TROPONINI in the last 168 hours. No results for input(s): TROPIPOC in the last 168 hours.  Chemistry Recent Labs  Lab 04/11/19 0830  NA 140  K 3.9  CL 105  CO2 27  GLUCOSE 108*  BUN 26*  CREATININE 1.18  CALCIUM 9.4  GFRNONAA >60  GFRAA >60  ANIONGAP 8    Recent Labs  Lab 04/11/19 0830  PROT 7.1  ALBUMIN 4.3  AST 28  ALT 31  ALKPHOS 56  BILITOT 0.9   Hematology Recent Labs  Lab 04/11/19 0830  WBC 9.7  RBC 4.69  HGB 14.0  HCT 43.4  MCV 92.5  MCH 29.9    MCHC 32.3  RDW 12.9  PLT 229   BNPNo results for input(s): BNP, PROBNP in the last 168 hours.  DDimer No results for input(s): DDIMER in the last 168 hours.   Radiology/Studies:  Dg Chest Portable 1 View  Result Date: 04/11/2019 CLINICAL DATA:  Epigastric pain since this morning EXAM: PORTABLE CHEST 1 VIEW COMPARISON:  February 07, 2019 FINDINGS: The mediastinal contour and cardiac silhouette are stable. Patient status post prior CABG and median sternotomy. There is no focal infiltrate, pulmonary edema, or pleural effusion. The osseous structures are stable. IMPRESSION: No acute cardiopulmonary disease identified. Electronically Signed   By: Seward Meth  Augustin Coupe M.D.   On: 04/11/2019 08:52   Ct Angio Chest/abd/pel For Dissection W And/or Wo Contrast  Result Date: 04/11/2019 CLINICAL DATA:  Chest and abdominal pain with radiation toward back EXAM: CT ANGIOGRAPHY CHEST, ABDOMEN AND PELVIS TECHNIQUE: Initially, axial CT images were obtained through the chest without intravenous contrast material administration. Multidetector CT imaging through the chest, abdomen and pelvis was performed using the standard protocol during bolus administration of intravenous contrast. Multiplanar reconstructed images and MIPs were obtained and reviewed to evaluate the vascular anatomy. CONTRAST:  166mL OMNIPAQUE IOHEXOL 350 MG/ML SOLN COMPARISON:  CT angiogram chest, abdomen, and pelvis September 28, 2018 FINDINGS: CTA CHEST FINDINGS Cardiovascular: There is no intramural hematoma within the thoracic aorta on noncontrast enhanced study. Ascending thoracic aortic diameter is measured at 3.9 x 3.8 cm. There is no demonstrable thoracic aortic aneurysm or dissection. Visualized great vessels appear unremarkable except for minimal calcification at the origin of the left subclavian artery. There are foci of aortic atherosclerosis. There are foci of native coronary artery calcification. Patient is status post coronary artery bypass  grafting. There is no pericardial effusion or pericardial thickening. There is no demonstrable pulmonary embolus. Mediastinum/Nodes: Visualized thyroid appears unremarkable. No adenopathy is evident in the thoracic region. No esophageal lesions are appreciable. Lungs/Pleura: No edema or consolidation. No pleural effusion evident. Musculoskeletal: Status post median sternotomy. Postoperative changes noted at C7 and T1 anteriorly. There are no blastic or lytic bone lesions. No chest wall lesions are evident. Review of the MIP images confirms the above findings. CTA ABDOMEN AND PELVIS FINDINGS VASCULAR Aorta: There is no evident abdominal aortic aneurysm or dissection. There are areas of peripheral plaque and scattered foci of calcification throughout the aorta without hemodynamically significant obstruction. Appearance similar to prior structure. Celiac: There is slight atherosclerotic plaque at the origin of the celiac artery without hemodynamically significant obstruction, stable. Branches of the celiac artery are widely patent. No aneurysm or dissection involving these vessels. SMA: Superior mesenteric artery and its branches are widely patent. No aneurysm or dissection. Renals: There is a single renal artery arising from the aorta. Slight stable atherosclerotic plaque is noted at the origin of the right renal artery without hemodynamically significant obstruction. Elsewhere renal arteries and their branches are widely patent. No aneurysm or dissection. No fibromuscular dysplasia evident. IMA: Inferior mesenteric artery and its branches are patent without appreciable obstructive disease. No aneurysm or dissection evident. Inflow: There are scattered foci of calcification along the periphery of each common iliac artery, slightly more on the right than on the left. There is approximately 30% diameter stenosis at the origin of the right common iliac artery. No hemodynamically significant obstruction noted in either  common iliac artery. There is mild calcification at the origins of the internal and external iliac arteries without hemodynamically significant obstruction. There is a short-segment focus of plaque in the proximal left common femoral artery causing between 50 and 60% diameter stenosis focally. This short-segment stenosis is best appreciated on sagittal slice 242 series 9. Stenosis in this area appears slightly more severe on axial coronal images; the vessel is best appreciated on the sagittal image in this area 1. No other appreciable obstruction is noted in the pelvic arterial vessels. Proximal superficial femoral and profunda femoral arteries are widely patent. There is no pelvic arterial aneurysm or dissection. Veins: No obvious venous abnormality within the limitations of this arterial phase study. Review of the MIP images confirms the above findings. NON-VASCULAR Hepatobiliary: No focal liver lesions are evident  on arterial phase study. Gallbladder wall is not appreciably thickened. No appreciable biliary duct dilatation. Pancreas: No pancreatic mass or inflammatory focus. Spleen: No splenic lesions are evident. Adrenals/Urinary Tract: Adrenals bilaterally appear normal. Kidneys bilaterally show no evident mass or hydronephrosis on either side. There is no evident renal or ureteral calculus on either side. Urinary bladder is midline with wall thickness within normal limits. Stomach/Bowel: There are sigmoid diverticula without diverticulitis. There is no appreciable bowel wall or mesenteric thickening. There is no evident bowel obstruction. No free air or portal venous air. Lymphatic: No lymph node enlargement evident in the abdomen or pelvis. Reproductive: Prostate and seminal vesicles are normal in size and contour. No evident pelvic mass. Other: Appendix appears normal. No abscess or ascites is evident in the abdomen or pelvis. Musculoskeletal: No blastic or lytic bone lesions are evident. No intramuscular or  abdominal wall lesions are evident. Review of the MIP images confirms the above findings. IMPRESSION: CT angiogram chest: 1. No thoracic aortic aneurysm or dissection. Ascending thoracic aortic diameter is 3.9 x 3.8 cm, essentially stable. There are foci of aortic atherosclerosis, similar to prior study. Patient is status post coronary artery bypass grafting. 2. No demonstrable pulmonary embolus. 3. No edema or consolidation. 4. No adenopathy in the thoracic region. CT angiogram abdomen; CT angiogram pelvis: 1. Moderate atherosclerotic plaque in the aorta without hemodynamically significant obstruction. No abdominal aortic aneurysm or dissection. 2. Foci of atherosclerotic plaque in the common iliac arteries without hemodynamically significant obstruction. 50% diameter short-segment obstruction in the proximal left common femoral artery. No more severe obstruction noted. No aneurysm or dissection in pelvic arterial vessels. 3. Slight atherosclerotic plaque at the origin the celiac artery and origin of right renal artery. No more severe obstruction evident in the major mesenteric arterial vessels. No aneurysm or dissection involving mesenteric arterial vessels. No fibromuscular dysplasia. 4. Sigmoid diverticulosis. No diverticulitis. No bowel obstruction. No abscess in the abdomen or pelvis. Appendix appears normal. 5. No evident renal or ureteral calculus. No hydronephrosis. Urinary bladder wall thickness normal. Electronically Signed   By: Lowella Grip III M.D.   On: 04/11/2019 10:07    Assessment and Plan:   [name] is a 63 y.o. male with past medical history of CAD status post anterior MI 10/2015 treated with emergent CABG, subsequent PCI, hypertension, hyperlipidemia, ischemic cardiomyopathy, stroke who presented with chest pain.  1. Unstable Angina: symptoms started late last evening, and recurred again this morning after taking his dogs outside. Worsened when he walked up the stairs.  Responsive to SL nitro. EKG without acute ischemia. HsT neg x2 but still reporting 5/10 chest pain on arrival [facility].  -- remains on IV heparin -- will plan for coronary angiography to reevaluate grafts and stent to RCA -- The patient understands that risks included but are not limited to stroke (1 in 1000), death (1 in 29), kidney failure [usually temporary] (1 in 500), bleeding (1 in 200), allergic reaction [possibly serious] (1 in 200).   2. HTN: reports his blood pressure has been well controlled on current therapy. Will continue the same.   3. HL: on high dose statin  4. ICM: most recent echo back in January with normal EF. No signs of volume overload on exam.   5. Stroke: s/p TPA back in 2018. No residual deficits.   Severity of Illness: The appropriate patient status for this patient is OBSERVATION. Observation status is judged to be reasonable and necessary in order to provide the required  intensity of service to ensure the patient's safety. The patient's presenting symptoms, physical exam findings, and initial radiographic and laboratory data in the context of their medical condition is felt to place them at decreased risk for further clinical deterioration. Furthermore, it is anticipated that the patient will be medically stable for discharge from the hospital within 2 midnights of admission. The following factors support the patient status of observation.   " The patient's presenting symptoms include chest pain. " The physical exam findings include stable exam. " The initial radiographic and laboratory data are stable on arrival.     For questions or updates, please contact [facility] Please consult [email or web address] for contact info under        Signed, Reino Bellis, NP  04/11/2019 1:23 PM

## 2019-04-11 NOTE — ED Triage Notes (Signed)
Epigastric pain since awakening this morning.  Radiates to back, neck, and left arm.  Vomited x1.  Diaphoresis during the night. Hx of [id number].

## 2019-04-11 NOTE — Interval H&P Note (Signed)
Cath Lab Visit (complete for each Cath Lab visit)  Clinical Evaluation Leading to the Procedure:   ACS: Yes.    Non-ACS:    Anginal Classification: CCS IV  Anti-ischemic medical therapy: Minimal Therapy (1 class of medications)  Non-Invasive Test Results: No non-invasive testing performed  Prior CABG: Previous CABG      History and Physical Interval Note:  04/11/2019 3:04 PM  [name]  has presented today for surgery, with the diagnosis of Chest pain.  The various methods of treatment have been discussed with the patient and family. After consideration of risks, benefits and other options for treatment, the patient has consented to  Procedure(s): LEFT HEART CATH AND CORS/GRAFTS ANGIOGRAPHY (N/A) as a surgical intervention.  The patient's history has been reviewed, patient examined, no change in status, stable for surgery.  I have reviewed the patient's chart and labs.  Questions were answered to the patient's satisfaction.     Larae Grooms

## 2019-04-11 NOTE — ED Provider Notes (Signed)
[facility] EMERGENCY DEPARTMENT Provider Note   CSN: [id number] Arrival date & time: 04/11/19  0820    History   Chief Complaint Chief Complaint  Patient presents with   Chest Pain    HPI [name] is a 63 y.o. male.     The history is provided by the patient.  Chest Pain Pain location:  Substernal area, epigastric, L chest and L lateral chest Pain quality: aching, dull and radiating   Pain radiates to:  Neck, L arm and mid back Pain severity:  Moderate Onset quality:  Sudden Timing:  Constant Progression:  Improving Chronicity:  New Context: at rest   Relieved by:  Nitroglycerin Worsened by:  Exertion Associated symptoms: back pain, diaphoresis and nausea   Associated symptoms: no abdominal pain, no cough, no fever, no palpitations, no shortness of breath and no vomiting   Risk factors: coronary artery disease, high cholesterol and hypertension     Past Medical History:  Diagnosis Date   Anxiety    Arthritis    Basal cell carcinoma (BCC) of forehead    CAD (coronary artery disease)    a. 10/2015 ant STEMI >> LHC with 3 v CAD; oLAD tx with POBA >> emergent CABG. b. Multiple evals since that time, early graft failure of SVG-RCA by cath 03/2016. c. 2/19 PCI/DES x1 to pRCA, normal EF.   Carotid artery disease ([place])    a. 40-59% BICA 02/2018.   Depression    Dyspnea    Ectopic atrial tachycardia (HCC)    Esophageal reflux    eosinophil esophagitis   Family history of adverse reaction to anesthesia    "sister has PONV" (06/21/2017)   Former tobacco use    Gout    Hepatitis C    "treated and cured" (06/21/2017)   High cholesterol    History of kidney stones    Hypertension    Ischemic cardiomyopathy    a. EF 25-30% at intraop TEE 4/17  //  b. Limited Echo 5/17 - EF 45-50%, mild ant HK. c. EF 55-65% by cath 09/2017.   Migraine    "3-4/yr" (06/21/2017)   Myocardial infarction ([place]) 10/2015   Palpitations    Sinus bradycardia     a. HR dropping into 40s in 02/2016 -> BB reduced.   Stroke [facility]) 10/2016   "small one; sometimes my memory/cognitive issues" (06/21/2017)   Symptomatic hypotension    a. 02/2016 ER visit -> meds reduced.   Syncope     Patient Active Problem List   Diagnosis Date Noted   Cervical radiculopathy 10/17/2018   Preoperative clearance 10/04/2018   Palpitations    Coronary artery disease of bypass graft of native heart with stable angina pectoris ([place])    Transient loss of consciousness 03/24/2018   Ectopic atrial tachycardia ([place]) 02/09/2018   Central chest pain 03/10/2017   Family hx-stroke 11/10/2016   Stroke-like episode ([place]) - R brain, s/p tPA 11/09/2016   Unstable angina ([place]) 09/07/2016   Claudication of both lower extremities ([place])    Pure hypercholesterolemia    Tobacco abuse disorder    CAD of autologous artery bypass graft without angina    Chest pain at rest 06/10/2016   Abnormal nuclear stress test - HIGH RISK 04/20/2016   Old MI (myocardial infarction)    Essential hypertension 02/26/2016   Ischemic cardiomyopathy 12/25/2015   Hyperlipidemia LDL goal <70 12/25/2015   Mild tobacco abuse in early remission 11/28/2015   Coronary artery disease involving native coronary artery  of native heart with unstable angina pectoris ([place]) 11/28/2015   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   Acute MI anterior wall first episode care [facility])    Chest pain with high risk for cardiac etiology 03/07/2015   Dysphagia 03/07/2015   Gout attack 03/07/2015   Mixed bipolar I disorder ([place]) 03/07/2015   Fibromyalgia 07/09/2014   Gout 07/09/2014   Anxiety 07/09/2014   Depression 07/09/2014   Hepatitis C 16/05/9603   Eosinophilic esophagitis 54/04/8118    Past Surgical History:  Procedure Laterality Date   ANTERIOR CERVICAL DECOMP/DISCECTOMY FUSION N/A 10/17/2018   Procedure: Anterior Cervical Decompression Fusion - Cervical seven -Thoracic one;  Surgeon: Consuella Lose, MD;  Location: [place];  Service: Neurosurgery;  Laterality: N/A;   BASAL CELL CARCINOMA EXCISION     "forehead   CARDIAC CATHETERIZATION N/A 11/28/2015   Procedure: Left Heart Cath and Coronary Angiography;  Surgeon: [name], MD;  Location: [place] CV LAB;  Service: Cardiovascular;  Laterality: N/A;   CARDIAC CATHETERIZATION N/A 11/28/2015   Procedure: Coronary Balloon Angioplasty;  Surgeon: [name], MD;  Location: [place] CV LAB;  Service: Cardiovascular;  Laterality: N/A;  ostial LAD   CARDIAC CATHETERIZATION N/A 11/28/2015   Procedure: Coronary/Graft Angiography;  Surgeon: [name], MD;  Location: [place] CV LAB;  Service: Cardiovascular;  Laterality: N/A;  coronaries only    CARDIAC CATHETERIZATION N/A 04/21/2016   Procedure: Left Heart Cath and Coronary Angiography;  Surgeon: Wellington Hampshire, MD;  Location: [place] CV LAB;  Service: Cardiovascular;  Laterality: N/A;   CARDIAC CATHETERIZATION N/A 06/14/2016   Procedure: Left Heart Cath and Cors/Grafts Angiography;  Surgeon: Lorretta Harp, MD;  Location: [place] CV LAB;  Service: Cardiovascular;  Laterality: N/A;   CARDIAC CATHETERIZATION N/A 09/08/2016   Procedure: Left Heart Cath and Cors/Grafts Angiography;  Surgeon: Wellington Hampshire, MD;  Location: [place] CV LAB;  Service: Cardiovascular;  Laterality: N/A;   CARDIAC CATHETERIZATION     CORONARY ARTERY BYPASS GRAFT N/A 11/28/2015   Procedure: CORONARY ARTERY BYPASS GRAFTING (CABG) TIMES FIVE USING LEFT INTERNAL MAMMARY ARTERY AND RIGHT GREATER SAPHENOUS,VIEN HARVEATED BY ENDOVIEN, INTRAOPPRATIVE TEE;  Surgeon: Gaye Pollack, MD;  Location: [place];  Service: Open Heart Surgery;  Laterality: N/A;   CORONARY STENT INTERVENTION N/A 10/05/2017   Procedure: CORONARY STENT INTERVENTION;  Surgeon: [name], MD;  Location: [place] CV LAB;  Service: Cardiovascular;  Laterality: N/A;   HUMERUS SURGERY Right  1969   "tumor inside bone; filled it w/bone chips"   LEFT HEART CATH AND CORS/GRAFTS ANGIOGRAPHY N/A 03/11/2017   Procedure: Left Heart Cath and Cors/Grafts Angiography;  Surgeon: Leonie Man, MD;  Location: [place] CV LAB;  Service: Cardiovascular;  Laterality: N/A;   LEFT HEART CATH AND CORS/GRAFTS ANGIOGRAPHY N/A 10/05/2017   Procedure: LEFT HEART CATH AND CORS/GRAFTS ANGIOGRAPHY;  Surgeon: [name], MD;  Location: [place] CV LAB;  Service: Cardiovascular;  Laterality: N/A;   PERIPHERAL VASCULAR CATHETERIZATION N/A 06/14/2016   Procedure: Lower Extremity Angiography;  Surgeon: Lorretta Harp, MD;  Location: [place] CV LAB;  Service: Cardiovascular;  Laterality: N/A;        Home Medications    Prior to Admission medications   Medication Sig Start Date End Date Taking? Authorizing Provider  acetaminophen (TYLENOL) 500 MG tablet Take 1,000 mg by mouth every 6 (six) hours as needed for mild pain or headache.     [provider]  amLODipine (NORVASC) 10 MG tablet Take 1 tablet (10 mg total) by mouth daily. 03/07/19   [name], MD  aspirin 81 MG tablet Take 1 tablet (81 mg total) by mouth daily. 10/24/18   Costella, Vista Mink, PA-C  atorvastatin (LIPITOR) 80 MG tablet TAKE 1 TABLET(80 MG) BY MOUTH DAILY Patient taking differently: Take 80 mg by mouth daily.  09/21/18   [name], MD  chlordiazePOXIDE (LIBRIUM) 10 MG capsule Take 10-20 mg by mouth at bedtime as needed for anxiety.     [provider]  clopidogrel (PLAVIX) 75 MG tablet Take 1 tablet (75 mg total) by mouth daily. 10/24/18   Costella, Vista Mink, PA-C  Doxepin HCl 6 MG TABS Take 6 mg by mouth at bedtime as needed for sleep. 02/03/19   [provider]  lisinopril (ZESTRIL) 5 MG tablet Take 5 mg by mouth daily. May take 1 additional tablet once daily for SBP greater than 150 once hour after taking daily dose 03/12/19   [name], MD  methocarbamol  (ROBAXIN-750) 750 MG tablet Take 1 tablet (750 mg total) by mouth 3 (three) times daily as needed for muscle spasms. 10/18/18   Costella, Vista Mink, PA-C  metoprolol succinate (TOPROL-XL) 25 MG 24 hr tablet Take 0.5 tablets (12.5 mg total) by mouth daily. Take with or immediately following a meal. 01/23/19   [name], MD  Multiple Vitamins-Minerals (ONE-A-DAY MENS 50+ ADVANTAGE PO) Take 1 tablet by mouth daily.     [provider]  nitroGLYCERIN (NITROSTAT) 0.4 MG SL tablet PLACE 1 TABLET UNDER THE TONGUE EVERY 5 MINUTES AS NEEDED FOR CHEST PAIN. 3 DOSES MAX Patient taking differently: Place 0.4 mg under the tongue every 5 (five) minutes as needed for chest pain.  08/08/18   Strader, Fransisco Hertz, PA-C  pantoprazole (PROTONIX) 40 MG tablet Take 40 mg by mouth daily.    [provider]  ranitidine (ZANTAC) 150 MG tablet Take 150 mg by mouth as needed for heartburn.    [provider]  ranolazine (RANEXA) 1000 MG SR tablet TAKE ONE TABLET BY MOUTH TWICE DAILY 03/08/19   Kathlen Mody, Scott T, PA-C  VIIBRYD 10 MG TABS Take 10 mg by mouth daily. 06/10/18   [provider]    Family History Family History  Problem Relation Age of Onset   Lung cancer Mother    Heart Problems Father    Heart attack Father 29   Stroke Father    Heart failure Father    Heart attack Maternal Grandmother    Stroke Maternal Grandmother    Heart attack Paternal Uncle    Hypertension Brother    Autoimmune disease Neg Hx     Social History Social History   Tobacco Use   Smoking status: Former Smoker    Packs/day: 0.75    Years: 44.00    Pack years: 33.00    Types: Cigarettes    Quit date: 11/28/2015    Years since quitting: 3.3   Smokeless tobacco: Never Used  Substance Use Topics   Alcohol use: No    Frequency: Never    Comment: 06/21/2017 "I'll have a few drinks q couple months"   Drug use: No    Comment: 06/21/2017 "nothing since the 1980s"      Allergies   Prednisone, Tetanus toxoids, Wellbutrin [bupropion], Morphine and related, Scallops [shellfish allergy], and Chantix [varenicline]   Review of Systems Review of Systems  Constitutional: Positive for diaphoresis. Negative for chills and  fever.  HENT: Negative for ear pain and sore throat.   Eyes: Negative for pain and visual disturbance.  Respiratory: Negative for cough and shortness of breath.   Cardiovascular: Positive for chest pain. Negative for palpitations.  Gastrointestinal: Positive for nausea. Negative for abdominal pain and vomiting.  Genitourinary: Negative for dysuria and hematuria.  Musculoskeletal: Positive for back pain. Negative for arthralgias.  Skin: Negative for color change and rash.  Neurological: Negative for seizures and syncope.  All other systems reviewed and are negative.    Physical Exam Updated Vital Signs BP 101/68    Pulse 69    Temp 98.3 F (36.8 C) (Oral)    Resp 19    Ht 5\' 8"  (1.727 m)    Wt 86.2 kg    SpO2 96%    BMI 28.89 kg/m   Physical Exam Vitals signs and nursing note reviewed.  Constitutional:      General: He is not in acute distress.    Appearance: He is well-developed. He is not ill-appearing.  HENT:     Head: Normocephalic and atraumatic.     Nose: Nose normal.     Mouth/Throat:     Mouth: Mucous membranes are moist.  Eyes:     Extraocular Movements: Extraocular movements intact.     Conjunctiva/sclera: Conjunctivae normal.     Pupils: Pupils are equal, round, and reactive to light.  Neck:     Musculoskeletal: Normal range of motion and neck supple.  Cardiovascular:     Rate and Rhythm: Normal rate and regular rhythm.     Pulses:          Radial pulses are 2+ on the right side and 1+ on the left side.       Dorsalis pedis pulses are 2+ on the right side and 2+ on the left side.     Heart sounds: Normal heart sounds. No murmur.  Pulmonary:     Effort: Pulmonary effort is normal. No respiratory distress.      Breath sounds: Normal breath sounds. No decreased breath sounds, wheezing or rhonchi.  Abdominal:     General: There is no distension.     Palpations: Abdomen is soft.     Tenderness: There is no abdominal tenderness.  Musculoskeletal: Normal range of motion.     Right lower leg: No edema.     Left lower leg: No edema.  Skin:    General: Skin is warm and dry.     Capillary Refill: Capillary refill takes less than 2 seconds.  Neurological:     General: No focal deficit present.     Mental Status: He is alert and oriented to person, place, and time.     Cranial Nerves: No cranial nerve deficit.     Motor: No weakness.  Psychiatric:        Mood and Affect: Mood normal.      ED Treatments / Results  Labs (all labs ordered are listed, but only abnormal results are displayed) Labs Reviewed  BASIC METABOLIC PANEL - Abnormal; Notable for the following components:      Result Value   Glucose, Bld 108 (*)    BUN 26 (*)    All other components within normal limits  SARS CORONAVIRUS 2 (HOSPITAL ORDER, [place] [facility])  CBC  HEPATIC FUNCTION PANEL  LIPASE, BLOOD  TROPONIN I (HIGH SENSITIVITY)  TROPONIN I (HIGH SENSITIVITY)    EKG EKG Interpretation  Date/Time:  Wednesday April 11 2019  08:31:42 EDT Ventricular Rate:  75 PR Interval:    QRS Duration: 98 QT Interval:  395 QTC Calculation: 442 R Axis:   70 Text Interpretation:  Sinus rhythm Low voltage, precordial leads Baseline wander in lead(s) V2 no change from prior Confirmed by Lennice Sites [phone]) on 04/11/2019 8:33:10 AM Also confirmed by Lennice Sites [phone]), editor Philomena Doheny [phone])  on 04/11/2019 10:33:37 AM   Radiology Dg Chest Portable 1 View  Result Date: 04/11/2019 CLINICAL DATA:  Epigastric pain since this morning EXAM: PORTABLE CHEST 1 VIEW COMPARISON:  February 07, 2019 FINDINGS: The mediastinal contour and cardiac silhouette are stable. Patient status post prior CABG and median  sternotomy. There is no focal infiltrate, pulmonary edema, or pleural effusion. The osseous structures are stable. IMPRESSION: No acute cardiopulmonary disease identified. Electronically Signed   By: Abelardo Diesel M.D.   On: 04/11/2019 08:52   Ct Angio Chest/abd/pel For Dissection W And/or Wo Contrast  Result Date: 04/11/2019 CLINICAL DATA:  Chest and abdominal pain with radiation toward back EXAM: CT ANGIOGRAPHY CHEST, ABDOMEN AND PELVIS TECHNIQUE: Initially, axial CT images were obtained through the chest without intravenous contrast material administration. Multidetector CT imaging through the chest, abdomen and pelvis was performed using the standard protocol during bolus administration of intravenous contrast. Multiplanar reconstructed images and MIPs were obtained and reviewed to evaluate the vascular anatomy. CONTRAST:  141mL OMNIPAQUE IOHEXOL 350 MG/ML SOLN COMPARISON:  CT angiogram chest, abdomen, and pelvis September 28, 2018 FINDINGS: CTA CHEST FINDINGS Cardiovascular: There is no intramural hematoma within the thoracic aorta on noncontrast enhanced study. Ascending thoracic aortic diameter is measured at 3.9 x 3.8 cm. There is no demonstrable thoracic aortic aneurysm or dissection. Visualized great vessels appear unremarkable except for minimal calcification at the origin of the left subclavian artery. There are foci of aortic atherosclerosis. There are foci of native coronary artery calcification. Patient is status post coronary artery bypass grafting. There is no pericardial effusion or pericardial thickening. There is no demonstrable pulmonary embolus. Mediastinum/Nodes: Visualized thyroid appears unremarkable. No adenopathy is evident in the thoracic region. No esophageal lesions are appreciable. Lungs/Pleura: No edema or consolidation. No pleural effusion evident. Musculoskeletal: Status post median sternotomy. Postoperative changes noted at C7 and T1 anteriorly. There are no blastic or lytic bone  lesions. No chest wall lesions are evident. Review of the MIP images confirms the above findings. CTA ABDOMEN AND PELVIS FINDINGS VASCULAR Aorta: There is no evident abdominal aortic aneurysm or dissection. There are areas of peripheral plaque and scattered foci of calcification throughout the aorta without hemodynamically significant obstruction. Appearance similar to prior structure. Celiac: There is slight atherosclerotic plaque at the origin of the celiac artery without hemodynamically significant obstruction, stable. Branches of the celiac artery are widely patent. No aneurysm or dissection involving these vessels. SMA: Superior mesenteric artery and its branches are widely patent. No aneurysm or dissection. Renals: There is a single renal artery arising from the aorta. Slight stable atherosclerotic plaque is noted at the origin of the right renal artery without hemodynamically significant obstruction. Elsewhere renal arteries and their branches are widely patent. No aneurysm or dissection. No fibromuscular dysplasia evident. IMA: Inferior mesenteric artery and its branches are patent without appreciable obstructive disease. No aneurysm or dissection evident. Inflow: There are scattered foci of calcification along the periphery of each common iliac artery, slightly more on the right than on the left. There is approximately 30% diameter stenosis at the origin of the right common iliac artery. No hemodynamically significant  obstruction noted in either common iliac artery. There is mild calcification at the origins of the internal and external iliac arteries without hemodynamically significant obstruction. There is a short-segment focus of plaque in the proximal left common femoral artery causing between 50 and 60% diameter stenosis focally. This short-segment stenosis is best appreciated on sagittal slice 161 series 9. Stenosis in this area appears slightly more severe on axial coronal images; the vessel is best  appreciated on the sagittal image in this area 1. No other appreciable obstruction is noted in the pelvic arterial vessels. Proximal superficial femoral and profunda femoral arteries are widely patent. There is no pelvic arterial aneurysm or dissection. Veins: No obvious venous abnormality within the limitations of this arterial phase study. Review of the MIP images confirms the above findings. NON-VASCULAR Hepatobiliary: No focal liver lesions are evident on arterial phase study. Gallbladder wall is not appreciably thickened. No appreciable biliary duct dilatation. Pancreas: No pancreatic mass or inflammatory focus. Spleen: No splenic lesions are evident. Adrenals/Urinary Tract: Adrenals bilaterally appear normal. Kidneys bilaterally show no evident mass or hydronephrosis on either side. There is no evident renal or ureteral calculus on either side. Urinary bladder is midline with wall thickness within normal limits. Stomach/Bowel: There are sigmoid diverticula without diverticulitis. There is no appreciable bowel wall or mesenteric thickening. There is no evident bowel obstruction. No free air or portal venous air. Lymphatic: No lymph node enlargement evident in the abdomen or pelvis. Reproductive: Prostate and seminal vesicles are normal in size and contour. No evident pelvic mass. Other: Appendix appears normal. No abscess or ascites is evident in the abdomen or pelvis. Musculoskeletal: No blastic or lytic bone lesions are evident. No intramuscular or abdominal wall lesions are evident. Review of the MIP images confirms the above findings. IMPRESSION: CT angiogram chest: 1. No thoracic aortic aneurysm or dissection. Ascending thoracic aortic diameter is 3.9 x 3.8 cm, essentially stable. There are foci of aortic atherosclerosis, similar to prior study. Patient is status post coronary artery bypass grafting. 2. No demonstrable pulmonary embolus. 3. No edema or consolidation. 4. No adenopathy in the thoracic  region. CT angiogram abdomen; CT angiogram pelvis: 1. Moderate atherosclerotic plaque in the aorta without hemodynamically significant obstruction. No abdominal aortic aneurysm or dissection. 2. Foci of atherosclerotic plaque in the common iliac arteries without hemodynamically significant obstruction. 50% diameter short-segment obstruction in the proximal left common femoral artery. No more severe obstruction noted. No aneurysm or dissection in pelvic arterial vessels. 3. Slight atherosclerotic plaque at the origin the celiac artery and origin of right renal artery. No more severe obstruction evident in the major mesenteric arterial vessels. No aneurysm or dissection involving mesenteric arterial vessels. No fibromuscular dysplasia. 4. Sigmoid diverticulosis. No diverticulitis. No bowel obstruction. No abscess in the abdomen or pelvis. Appendix appears normal. 5. No evident renal or ureteral calculus. No hydronephrosis. Urinary bladder wall thickness normal. Electronically Signed   By: Lowella Grip III M.D.   On: 04/11/2019 10:07    Procedures .Critical Care Performed by: Lennice Sites, DO Authorized by: Lennice Sites, DO   Critical care provider statement:    Critical care time (minutes):  45   Critical care was necessary to treat or prevent imminent or life-threatening deterioration of the following conditions:  Cardiac failure   Critical care was time spent personally by me on the following activities:  Blood draw for specimens, development of treatment plan with patient or surrogate, discussions with primary provider, evaluation of patient's response to treatment, examination  of patient, obtaining history from patient or surrogate, ordering and performing treatments and interventions, ordering and review of laboratory studies, ordering and review of radiographic studies, pulse oximetry, re-evaluation of patient's condition and review of old charts   I assumed direction of critical care for  this patient from another provider in my specialty: no     (including critical care time)  Medications Ordered in ED Medications  nitroGLYCERIN (NITROSTAT) SL tablet 0.4 mg (0.4 mg Sublingual Given 04/11/19 1110)  heparin bolus via infusion 4,000 Units (has no administration in time range)  heparin ADULT infusion 100 units/mL (25000 units/268mL sodium chloride 0.45%) (has no administration in time range)  fentaNYL (SUBLIMAZE) injection 50 mcg (50 mcg Intravenous Given 04/11/19 0853)  aspirin chewable tablet 324 mg (324 mg Oral Given 04/11/19 0852)  ondansetron (ZOFRAN) injection 4 mg (4 mg Intravenous Given 04/11/19 0911)  iohexol (OMNIPAQUE) 350 MG/ML injection 100 mL (100 mLs Intravenous Contrast Given 04/11/19 0924)  fentaNYL (SUBLIMAZE) injection 50 mcg (50 mcg Intravenous Given 04/11/19 0956)     Initial Impression / Assessment and Plan / ED Course  I have reviewed the triage vital signs and the nursing notes.  Pertinent labs & imaging results that were available during my care of the patient were reviewed by me and considered in my medical decision making (see chart for details).     [name] is a 63 year old male with history of CAD with recent stent placed last year with multiple vessel disease status post CABG, high cholesterol, hypertension who presents the ED with chest pain.  Patient with normal vitals.  No fever.  Patient states pain began around 6:00 this morning, radiated to his back, neck, left arm.  Has some tingling down his left arm and lightheadedness, diaphoresis.  Felt nauseous and threw up once.  Took multiple doses of nitroglycerin with improvement of his pain.  Pain is now about a 3 out of 10.  Blood pressure is 110/70.  Will give IV fentanyl, IV Zofran for pain and nausea.  Patient has good blood pressures in both arms.  However has slightly diminished pulse in the left radial when compared to the right.  Did have some possible neurologic symptoms when his chest pain  first began we will get a dissection study to further evaluate for chest pain.  Denies any infectious symptoms.  Does not have any specific abdominal tenderness on exam.  Will give aspirin.  Patient with a heart score is 6 given history and physical and his past cardiac issues.  Had a cath last year which he had a stent placed but also had multiple vessel disease at that time as well.  Has been compliant with his medications.  EKG shows sinus rhythm.  No new ischemic changes.  Initial troponin is normal.  Chest x-ray shows no signs of pneumonia, no pneumothorax, no pleural effusion.  Gallbladder, liver enzymes, lipase all normal.  Unlikely intra-abdominal issue.  No significant anemia, electrolyte abnormality, kidney injury.  CT dissection study overall shows stable diameter of a sending thoracic aorta.  Patient has diffuse aortic atherosclerosis but similar to prior study.  No PE.  No dissection.  Repeat troponin is normal.  Chest pain is not completely gone after additional doses of IV fentanyl and IV nitroglycerin.  Concern for unstable angina and will start the patient on IV heparin bolus and infusion.  Talked with Dr. Martinique with cardiology who accepts the patient for admission at [place].  Anticipate cardiac catheterization later today or  tomorrow.  Hemodynamically stable throughout my care.  This chart was dictated using voice recognition software.  Despite best efforts to proofread,  errors can occur which can change the documentation meaning.    Final Clinical Impressions(s) / ED Diagnoses   Final diagnoses:  Unstable angina [facility])    ED Discharge Orders    None       Lennice Sites, DO 04/11/19 1132

## 2019-04-11 NOTE — Progress Notes (Addendum)
ANTICOAGULATION CONSULT NOTE - Initial Consult  Pharmacy Consult for heparin Indication: chest pain/ACS   Patient Measurements: Height: 5\' 8"  (172.7 cm) Weight: 190 lb (86.2 kg) IBW/kg (Calculated) : 68.4 Heparin Dosing Weight: 85.7 kg  Vital Signs: Temp: 98.3 F (36.8 C) (08/12 0833) Temp Source: Oral (08/12 0833) BP: 103/70 (08/12 1030) Pulse Rate: 80 (08/12 1030)  Labs: Recent Labs    04/11/19 0830 04/11/19 1030  HGB 14.0  --   HCT 43.4  --   PLT 229  --   CREATININE 1.18  --   TROPONINIHS 3 3     Assessment: 63 yo male admitted with chest pain. He had a cardiac cath last year. He was started on heparin at [facility]. Likely going for another cath upon transfer to [facility]. CBC unremarkable, SCr 1.1.   Goal of Therapy:  Heparin level 0.3-0.7 units/ml Monitor platelets by anticoagulation protocol: Yes    Plan:  -Heparin bolus 4000 units x1 then 1100 units/hr -Daily HL, CBC -Check level this afternoon   Harvel Quale 04/11/2019,11:21 AM

## 2019-04-11 NOTE — Progress Notes (Addendum)
Site area: rt groin fa sheath pulled and pressure held by [name] Site Prior to Removal:  Level 0 Pressure Applied For: 20 minutes Manual:   yes Patient Status During Pull:  stable Post Pull Site:  Level 0 Post Pull Instructions Given:  yes Post Pull Pulses Present: rt dp dopplered Dressing Applied:  Gauze and tegaderm Bedrest begins @ 1625 Comments:

## 2019-04-11 NOTE — ED Notes (Signed)
ED Provider at bedside. 

## 2019-04-12 ENCOUNTER — Encounter (HOSPITAL_COMMUNITY): Payer: Self-pay | Admitting: Interventional Cardiology

## 2019-04-12 DIAGNOSIS — I25709 Atherosclerosis of coronary artery bypass graft(s), unspecified, with unspecified angina pectoris: Secondary | ICD-10-CM | POA: Diagnosis not present

## 2019-04-12 DIAGNOSIS — K219 Gastro-esophageal reflux disease without esophagitis: Secondary | ICD-10-CM | POA: Diagnosis not present

## 2019-04-12 DIAGNOSIS — R072 Precordial pain: Secondary | ICD-10-CM

## 2019-04-12 LAB — CBC
HCT: 41.4 % (ref 39.0–52.0)
Hemoglobin: 14 g/dL (ref 13.0–17.0)
MCH: 30.6 pg (ref 26.0–34.0)
MCHC: 33.8 g/dL (ref 30.0–36.0)
MCV: 90.4 fL (ref 80.0–100.0)
Platelets: 222 10*3/uL (ref 150–400)
RBC: 4.58 MIL/uL (ref 4.22–5.81)
RDW: 12.7 % (ref 11.5–15.5)
WBC: 7 10*3/uL (ref 4.0–10.5)
nRBC: 0 % (ref 0.0–0.2)

## 2019-04-12 NOTE — Progress Notes (Signed)
Progress Note  Patient Name: [name] Date of Encounter: 04/12/2019  Primary Cardiologist: Larae Grooms, MD   Subjective   Cath yesterday with stable disease. Still with chest pain overnight. Had a GI cocktail with improvement.   Inpatient Medications    Scheduled Meds: . amLODipine  10 mg Oral Daily  . aspirin EC  81 mg Oral Daily  . atorvastatin  80 mg Oral q1800  . clopidogrel  75 mg Oral Daily  . famotidine  20 mg Oral Daily  . lisinopril  5 mg Oral Daily  . metoprolol succinate  12.5 mg Oral Daily  . pantoprazole  40 mg Oral Daily  . ranolazine  1,000 mg Oral BID  . sodium chloride flush  3 mL Intravenous Q12H  . sodium chloride flush  3 mL Intravenous Q12H   Continuous Infusions: . sodium chloride     PRN Meds: sodium chloride, acetaminophen, methocarbamol, nitroGLYCERIN, ondansetron (ZOFRAN) IV, sodium chloride flush   Vital Signs    Vitals:   04/11/19 2028 04/12/19 0011 04/12/19 0652 04/12/19 0652  BP:   124/74 124/74  Pulse:   69 72  Resp:      Temp: 98.1 F (36.7 C) 97.6 F (36.4 C) 97.9 F (36.6 C) 97.9 F (36.6 C)  TempSrc: Oral Oral Oral Oral  SpO2:   100% 100%  Weight:    86.6 kg  Height:        Intake/Output Summary (Last 24 hours) at 04/12/2019 0801 Last data filed at 04/12/2019 0400 Gross per 24 hour  Intake 619.2 ml  Output -  Net 619.2 ml   Last 3 Weights 04/12/2019 04/11/2019 04/11/2019  Weight (lbs) 190 lb 14.4 oz 193 lb 3.2 oz 190 lb  Weight (kg) 86.592 kg 87.635 kg 86.183 kg      Telemetry    SR - Personally Reviewed  ECG    No new tracing - Personally Reviewed  Physical Exam   GEN: No acute distress.   Neck: No JVD Cardiac: RRR, no murmurs, rubs, or gallops.  Respiratory: Clear to auscultation bilaterally. GI: Soft, nontender, non-distended  MS: No edema; No deformity. Right femoral cath site stable.  Neuro:  Nonfocal  Psych: Normal affect   Labs    High Sensitivity Troponin:   Recent Labs  Lab  04/11/19 0830 04/11/19 1030  TROPONINIHS 3 3      Cardiac EnzymesNo results for input(s): TROPONINI in the last 168 hours. No results for input(s): TROPIPOC in the last 168 hours.   Chemistry Recent Labs  Lab 04/11/19 0830  NA 140  K 3.9  CL 105  CO2 27  GLUCOSE 108*  BUN 26*  CREATININE 1.18  CALCIUM 9.4  PROT 7.1  ALBUMIN 4.3  AST 28  ALT 31  ALKPHOS 56  BILITOT 0.9  GFRNONAA >60  GFRAA >60  ANIONGAP 8     Hematology Recent Labs  Lab 04/11/19 0830 04/12/19 0620  WBC 9.7 7.0  RBC 4.69 4.58  HGB 14.0 14.0  HCT 43.4 41.4  MCV 92.5 90.4  MCH 29.9 30.6  MCHC 32.3 33.8  RDW 12.9 12.7  PLT 229 222    BNPNo results for input(s): BNP, PROBNP in the last 168 hours.   DDimer No results for input(s): DDIMER in the last 168 hours.   Radiology    Dg Chest Portable 1 View  Result Date: 04/11/2019 CLINICAL DATA:  Epigastric pain since this morning EXAM: PORTABLE CHEST 1 VIEW COMPARISON:  February 07, 2019 FINDINGS:  The mediastinal contour and cardiac silhouette are stable. Patient status post prior CABG and median sternotomy. There is no focal infiltrate, pulmonary edema, or pleural effusion. The osseous structures are stable. IMPRESSION: No acute cardiopulmonary disease identified. Electronically Signed   By: Abelardo Diesel M.D.   On: 04/11/2019 08:52   Ct Angio Chest/abd/pel For Dissection W And/or Wo Contrast  Result Date: 04/11/2019 CLINICAL DATA:  Chest and abdominal pain with radiation toward back EXAM: CT ANGIOGRAPHY CHEST, ABDOMEN AND PELVIS TECHNIQUE: Initially, axial CT images were obtained through the chest without intravenous contrast material administration. Multidetector CT imaging through the chest, abdomen and pelvis was performed using the standard protocol during bolus administration of intravenous contrast. Multiplanar reconstructed images and MIPs were obtained and reviewed to evaluate the vascular anatomy. CONTRAST:  1106mL OMNIPAQUE IOHEXOL 350 MG/ML SOLN  COMPARISON:  CT angiogram chest, abdomen, and pelvis September 28, 2018 FINDINGS: CTA CHEST FINDINGS Cardiovascular: There is no intramural hematoma within the thoracic aorta on noncontrast enhanced study. Ascending thoracic aortic diameter is measured at 3.9 x 3.8 cm. There is no demonstrable thoracic aortic aneurysm or dissection. Visualized great vessels appear unremarkable except for minimal calcification at the origin of the left subclavian artery. There are foci of aortic atherosclerosis. There are foci of native coronary artery calcification. Patient is status post coronary artery bypass grafting. There is no pericardial effusion or pericardial thickening. There is no demonstrable pulmonary embolus. Mediastinum/Nodes: Visualized thyroid appears unremarkable. No adenopathy is evident in the thoracic region. No esophageal lesions are appreciable. Lungs/Pleura: No edema or consolidation. No pleural effusion evident. Musculoskeletal: Status post median sternotomy. Postoperative changes noted at C7 and T1 anteriorly. There are no blastic or lytic bone lesions. No chest wall lesions are evident. Review of the MIP images confirms the above findings. CTA ABDOMEN AND PELVIS FINDINGS VASCULAR Aorta: There is no evident abdominal aortic aneurysm or dissection. There are areas of peripheral plaque and scattered foci of calcification throughout the aorta without hemodynamically significant obstruction. Appearance similar to prior structure. Celiac: There is slight atherosclerotic plaque at the origin of the celiac artery without hemodynamically significant obstruction, stable. Branches of the celiac artery are widely patent. No aneurysm or dissection involving these vessels. SMA: Superior mesenteric artery and its branches are widely patent. No aneurysm or dissection. Renals: There is a single renal artery arising from the aorta. Slight stable atherosclerotic plaque is noted at the origin of the right renal artery without  hemodynamically significant obstruction. Elsewhere renal arteries and their branches are widely patent. No aneurysm or dissection. No fibromuscular dysplasia evident. IMA: Inferior mesenteric artery and its branches are patent without appreciable obstructive disease. No aneurysm or dissection evident. Inflow: There are scattered foci of calcification along the periphery of each common iliac artery, slightly more on the right than on the left. There is approximately 30% diameter stenosis at the origin of the right common iliac artery. No hemodynamically significant obstruction noted in either common iliac artery. There is mild calcification at the origins of the internal and external iliac arteries without hemodynamically significant obstruction. There is a short-segment focus of plaque in the proximal left common femoral artery causing between 50 and 60% diameter stenosis focally. This short-segment stenosis is best appreciated on sagittal slice 202 series 9. Stenosis in this area appears slightly more severe on axial coronal images; the vessel is best appreciated on the sagittal image in this area 1. No other appreciable obstruction is noted in the pelvic arterial vessels. Proximal superficial femoral and  profunda femoral arteries are widely patent. There is no pelvic arterial aneurysm or dissection. Veins: No obvious venous abnormality within the limitations of this arterial phase study. Review of the MIP images confirms the above findings. NON-VASCULAR Hepatobiliary: No focal liver lesions are evident on arterial phase study. Gallbladder wall is not appreciably thickened. No appreciable biliary duct dilatation. Pancreas: No pancreatic mass or inflammatory focus. Spleen: No splenic lesions are evident. Adrenals/Urinary Tract: Adrenals bilaterally appear normal. Kidneys bilaterally show no evident mass or hydronephrosis on either side. There is no evident renal or ureteral calculus on either side. Urinary bladder  is midline with wall thickness within normal limits. Stomach/Bowel: There are sigmoid diverticula without diverticulitis. There is no appreciable bowel wall or mesenteric thickening. There is no evident bowel obstruction. No free air or portal venous air. Lymphatic: No lymph node enlargement evident in the abdomen or pelvis. Reproductive: Prostate and seminal vesicles are normal in size and contour. No evident pelvic mass. Other: Appendix appears normal. No abscess or ascites is evident in the abdomen or pelvis. Musculoskeletal: No blastic or lytic bone lesions are evident. No intramuscular or abdominal wall lesions are evident. Review of the MIP images confirms the above findings. IMPRESSION: CT angiogram chest: 1. No thoracic aortic aneurysm or dissection. Ascending thoracic aortic diameter is 3.9 x 3.8 cm, essentially stable. There are foci of aortic atherosclerosis, similar to prior study. Patient is status post coronary artery bypass grafting. 2. No demonstrable pulmonary embolus. 3. No edema or consolidation. 4. No adenopathy in the thoracic region. CT angiogram abdomen; CT angiogram pelvis: 1. Moderate atherosclerotic plaque in the aorta without hemodynamically significant obstruction. No abdominal aortic aneurysm or dissection. 2. Foci of atherosclerotic plaque in the common iliac arteries without hemodynamically significant obstruction. 50% diameter short-segment obstruction in the proximal left common femoral artery. No more severe obstruction noted. No aneurysm or dissection in pelvic arterial vessels. 3. Slight atherosclerotic plaque at the origin the celiac artery and origin of right renal artery. No more severe obstruction evident in the major mesenteric arterial vessels. No aneurysm or dissection involving mesenteric arterial vessels. No fibromuscular dysplasia. 4. Sigmoid diverticulosis. No diverticulitis. No bowel obstruction. No abscess in the abdomen or pelvis. Appendix appears normal. 5. No  evident renal or ureteral calculus. No hydronephrosis. Urinary bladder wall thickness normal. Electronically Signed   By: Lowella Grip III M.D.   On: 04/11/2019 10:07    Cardiac Studies   Cath: 04/11/19   Mid LAD lesion is 55% stenosed.  Ost LAD to Prox LAD lesion is 50% stenosed. LIMA to LAD is patent.  Ost Ramus lesion is 90% stenosed.  Ramus lesion is 75% stenosed.  Ost Cx lesion is 80% stenosed.  Prox Cx to Mid Cx lesion is 50% stenosed. SVG to OM is patent.  Dist RCA lesion is 50% stenosed.  RPDA lesion is 40% stenosed. SVG to PDA is patent with proximal disease that is unchanged.  Previously placed Ost RCA to Prox RCA drug eluting stent is widely patent.  Graft to the diagonal was occluded. There was TIMI 3 flow in the small diagonal with proximal ectasia.  The left ventricular systolic function is normal.  LV end diastolic pressure is normal. LVEDP 11 mm Hg.  The left ventricular ejection fraction is 50-55% by visual estimate.  There is no aortic valve stenosis.   Continue medical therapy.    Diagnostic Dominance: Right   Patient Profile     63 y.o. 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   Assessment & Plan    1. Chest Pain: presented with symptoms similar to what he experienced with prior ACS. Cath noted above, stable disease and no culprit lesion noted. HsT neg. Non cardiac chest pain. Did receive a GI cocktail last night with improvement in symptoms but returned this morning. Uses PPI at home. Has felt more stressed. Could increase PPI to BID for short course?   2. HTN: reports his blood pressure has been well controlled on current therapy. Will continue the same.   3. HL: on high dose statin  4. ICM: most recent echo back in January with normal EF. No signs of volume overload on  exam.   5. Stroke: s/p TPA back in 2018. No residual deficits.    For questions or updates, please contact [place] Please consult [email or web address] for contact info under   Signed, Reino Bellis, NP  04/12/2019, 8:01 AM

## 2019-04-12 NOTE — Discharge Summary (Signed)
Discharge Summary    Patient ID: [name],  MRN: [id number], DOB/AGE: 1955-10-02 63 y.o.  Admit date: 04/11/2019 Discharge date: 04/12/2019  Primary Care Provider: Orpah Villa Primary Cardiologist: Anthony Grooms, MD  Discharge Diagnoses    Active Problems:   Precordial chest pain   Unstable angina [facility])   Coronary artery disease involving coronary bypass graft of native heart with angina pectoris (HCC)   Gastroesophageal reflux disease   Allergies Allergies  Allergen Reactions  . Prednisone Other (See Comments)    States that this med makes him "crazy"  . Tetanus Toxoids Swelling and Other (See Comments)    Fever, Swelling of the arm   . Wellbutrin [Bupropion] Other (See Comments)    Crazy thoughts, nightmares  . Morphine And Related Hives and Itching    Redness at the injection site  . Scallops [Shellfish Allergy] Nausea Only  . Chantix [Varenicline] Other (See Comments)    Dreams    Diagnostic Studies/Procedures    Cath: 04/11/19   Mid LAD lesion is 55% stenosed.  Ost LAD to Prox LAD lesion is 50% stenosed. LIMA to LAD is patent.  Ost Ramus lesion is 90% stenosed.  Ramus lesion is 75% stenosed.  Ost Cx lesion is 80% stenosed.  Prox Cx to Mid Cx lesion is 50% stenosed. SVG to OM is patent.  Dist RCA lesion is 50% stenosed.  RPDA lesion is 40% stenosed. SVG to PDA is patent with proximal disease that is unchanged.  Previously placed Ost RCA to Prox RCA drug eluting stent is widely patent.  Graft to the diagonal was occluded. There was TIMI 3 flow in the small diagonal with proximal ectasia.  The left ventricular systolic function is normal.  LV end diastolic pressure is normal. LVEDP 11 mm Hg.  The left ventricular ejection fraction is 50-55% by visual estimate.  There is no aortic valve stenosis.   Continue medical therapy.    Diagnostic Dominance: Right   _____________   History of Present Illness     [name] is a  63 year old male with past medical history noted above.  Presented initially and March 2017 with an anterior MI and ultimately underwent emergency CABG.  Subsequent cath with occluded SVG to diagonal and moderate disease in the SVG to RCA, and had native RCA DES placed in February 2019.  Noted to have ischemic cardiomyopathy after his initial CABG with EF down to 20-25%. Improved to 55-60% on echo from 1/20.  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  Seen in the office by [name] on 02/24/2018 and increased his Ranexa to thousand milligrams twice a day and metoprolol 200 mg daily.  Seen 02/2018 for presyncope that was not felt to be cardiac in origin.  Presented to the ER 11/19 with palpitations and heart rate in the 120s.  Also had atypical chest pain which anxiety was felt to be a component of.  Did have relief with narcotics.  Had an event monitor placed noting normal sinus rhythm with occasional sinus bradycardia.  No atrial fibrillation noted.   Presented back to the ED 09/28/2018 for atypical chest pain that was reproducible.  2D echocardiogram at that time showed EF of 55 to 60% with no valvular problems.  CTA showed no evidence of aortic dissection.  He was in seen in the office on 10/04/2018 to be scheduled for cardiac clearance for a C7-T1 anterior cervical fusion to be done by [name].  He was  cleared for surgery and underwent ACDF of C7-T1 on 10/17/2018.   Reported since having his back surgery he had done well.  Did hold his Plavix for about 2 weeks during that time.  He has been fairly active, working regularly and had not had any exertional symptoms.  Stated the night prior to admission he woke up and felt centralized chest pressure/burning.  Felt it may have been related to indigestion/acid reflux and was able to go back to bed.  Stated he woke up early the following morning and took the dogs out when he developed recurrence of the same chest pain.  Symptoms  persisted and actually worsened whenever he walked up the stairs.  Pain started to radiate down his left arm.  He did take 1 sublingual nitroglycerin which did not improve his symptoms therefore took a second.  Became concerned when symptoms persisted and had his wife bring him to the ED at [facility] for further evaluation.  There his labs showed stable electrolytes, creatinine 1.1, high-sensitivity troponin 3>> 3, hemoglobin 14.  EKG shows sinus rhythm with no acute ST/T wave abnormalities.  He was given 3 aspirin, and a total of 150 mcgs of fentanyl, and placed on IV heparin. On arrival still with 5/10 chest discomfort. Given symptoms were similar to prior ACS events he was taken for cardiac cath.  Hospital Course     Underwent cardiac cath noted above with stable disease. Patent LIMA-LAD, SVG-OM, SVG-PDA with proximal disease unchanged, occluded SVG-Diag (old). Patent DES to the ostial RCA. No culprit for chest pain. Did receive a GI cocktail the evening post cath with some improvement in symptoms. Blood pressures remained controlled. Continued on home medications without change. Felt to be GI in nature. Instructed to continue on home dose of Protonix and Zantac.    [name] was seen by [name] and determined stable for discharge home. Follow up in the office has been arranged. Medications are listed below.   _____________  Discharge Vitals Blood pressure 116/82, pulse 82, temperature 97.9 F (36.6 C), temperature source Oral, resp. rate 13, height 5\' 8"  (1.727 m), weight 86.6 kg, SpO2 100 %.  Filed Weights   04/11/19 0829 04/11/19 1430 04/12/19 0652  Weight: 86.2 kg 87.6 kg 86.6 kg    Labs & Radiologic Studies    CBC Recent Labs    04/11/19 0830 04/12/19 0620  WBC 9.7 7.0  HGB 14.0 14.0  HCT 43.4 41.4  MCV 92.5 90.4  PLT 229 174   Basic Metabolic Panel Recent Labs    04/11/19 0830  NA 140  K 3.9  CL 105  CO2 27  GLUCOSE 108*  BUN 26*  CREATININE  1.18  CALCIUM 9.4   Liver Function Tests Recent Labs    04/11/19 0830  AST 28  ALT 31  ALKPHOS 56  BILITOT 0.9  PROT 7.1  ALBUMIN 4.3   Recent Labs    04/11/19 0830  LIPASE 28   Cardiac Enzymes No results for input(s): CKTOTAL, CKMB, CKMBINDEX, TROPONINI in the last 72 hours. BNP Invalid input(s): POCBNP D-Dimer No results for input(s): DDIMER in the last 72 hours. Hemoglobin A1C No results for input(s): HGBA1C in the last 72 hours. Fasting Lipid Panel No results for input(s): CHOL, HDL, LDLCALC, TRIG, CHOLHDL, LDLDIRECT in the last 72 hours. Thyroid Function Tests No results for input(s): TSH, T4TOTAL, T3FREE, THYROIDAB in the last 72 hours.  Invalid input(s): FREET3 _____________  Dg Chest Portable 1 View  Result  Date: 04/11/2019 CLINICAL DATA:  Epigastric pain since this morning EXAM: PORTABLE CHEST 1 VIEW COMPARISON:  February 07, 2019 FINDINGS: The mediastinal contour and cardiac silhouette are stable. Patient status post prior CABG and median sternotomy. There is no focal infiltrate, pulmonary edema, or pleural effusion. The osseous structures are stable. IMPRESSION: No acute cardiopulmonary disease identified. Electronically Signed   By: Abelardo Diesel M.D.   On: 04/11/2019 08:52   Ct Angio Chest/abd/pel For Dissection W And/or Wo Contrast  Result Date: 04/11/2019 CLINICAL DATA:  Chest and abdominal pain with radiation toward back EXAM: CT ANGIOGRAPHY CHEST, ABDOMEN AND PELVIS TECHNIQUE: Initially, axial CT images were obtained through the chest without intravenous contrast material administration. Multidetector CT imaging through the chest, abdomen and pelvis was performed using the standard protocol during bolus administration of intravenous contrast. Multiplanar reconstructed images and MIPs were obtained and reviewed to evaluate the vascular anatomy. CONTRAST:  177mL OMNIPAQUE IOHEXOL 350 MG/ML SOLN COMPARISON:  CT angiogram chest, abdomen, and pelvis September 28, 2018  FINDINGS: CTA CHEST FINDINGS Cardiovascular: There is no intramural hematoma within the thoracic aorta on noncontrast enhanced study. Ascending thoracic aortic diameter is measured at 3.9 x 3.8 cm. There is no demonstrable thoracic aortic aneurysm or dissection. Visualized great vessels appear unremarkable except for minimal calcification at the origin of the left subclavian artery. There are foci of aortic atherosclerosis. There are foci of native coronary artery calcification. Patient is status post coronary artery bypass grafting. There is no pericardial effusion or pericardial thickening. There is no demonstrable pulmonary embolus. Mediastinum/Nodes: Visualized thyroid appears unremarkable. No adenopathy is evident in the thoracic region. No esophageal lesions are appreciable. Lungs/Pleura: No edema or consolidation. No pleural effusion evident. Musculoskeletal: Status post median sternotomy. Postoperative changes noted at C7 and T1 anteriorly. There are no blastic or lytic bone lesions. No chest wall lesions are evident. Review of the MIP images confirms the above findings. CTA ABDOMEN AND PELVIS FINDINGS VASCULAR Aorta: There is no evident abdominal aortic aneurysm or dissection. There are areas of peripheral plaque and scattered foci of calcification throughout the aorta without hemodynamically significant obstruction. Appearance similar to prior structure. Celiac: There is slight atherosclerotic plaque at the origin of the celiac artery without hemodynamically significant obstruction, stable. Branches of the celiac artery are widely patent. No aneurysm or dissection involving these vessels. SMA: Superior mesenteric artery and its branches are widely patent. No aneurysm or dissection. Renals: There is a single renal artery arising from the aorta. Slight stable atherosclerotic plaque is noted at the origin of the right renal artery without hemodynamically significant obstruction. Elsewhere renal arteries and  their branches are widely patent. No aneurysm or dissection. No fibromuscular dysplasia evident. IMA: Inferior mesenteric artery and its branches are patent without appreciable obstructive disease. No aneurysm or dissection evident. Inflow: There are scattered foci of calcification along the periphery of each common iliac artery, slightly more on the right than on the left. There is approximately 30% diameter stenosis at the origin of the right common iliac artery. No hemodynamically significant obstruction noted in either common iliac artery. There is mild calcification at the origins of the internal and external iliac arteries without hemodynamically significant obstruction. There is a short-segment focus of plaque in the proximal left common femoral artery causing between 50 and 60% diameter stenosis focally. This short-segment stenosis is best appreciated on sagittal slice 885 series 9. Stenosis in this area appears slightly more severe on axial coronal images; the vessel is best appreciated on the  sagittal image in this area 1. No other appreciable obstruction is noted in the pelvic arterial vessels. Proximal superficial femoral and profunda femoral arteries are widely patent. There is no pelvic arterial aneurysm or dissection. Veins: No obvious venous abnormality within the limitations of this arterial phase study. Review of the MIP images confirms the above findings. NON-VASCULAR Hepatobiliary: No focal liver lesions are evident on arterial phase study. Gallbladder wall is not appreciably thickened. No appreciable biliary duct dilatation. Pancreas: No pancreatic mass or inflammatory focus. Spleen: No splenic lesions are evident. Adrenals/Urinary Tract: Adrenals bilaterally appear normal. Kidneys bilaterally show no evident mass or hydronephrosis on either side. There is no evident renal or ureteral calculus on either side. Urinary bladder is midline with wall thickness within normal limits. Stomach/Bowel:  There are sigmoid diverticula without diverticulitis. There is no appreciable bowel wall or mesenteric thickening. There is no evident bowel obstruction. No free air or portal venous air. Lymphatic: No lymph node enlargement evident in the abdomen or pelvis. Reproductive: Prostate and seminal vesicles are normal in size and contour. No evident pelvic mass. Other: Appendix appears normal. No abscess or ascites is evident in the abdomen or pelvis. Musculoskeletal: No blastic or lytic bone lesions are evident. No intramuscular or abdominal wall lesions are evident. Review of the MIP images confirms the above findings. IMPRESSION: CT angiogram chest: 1. No thoracic aortic aneurysm or dissection. Ascending thoracic aortic diameter is 3.9 x 3.8 cm, essentially stable. There are foci of aortic atherosclerosis, similar to prior study. Patient is status post coronary artery bypass grafting. 2. No demonstrable pulmonary embolus. 3. No edema or consolidation. 4. No adenopathy in the thoracic region. CT angiogram abdomen; CT angiogram pelvis: 1. Moderate atherosclerotic plaque in the aorta without hemodynamically significant obstruction. No abdominal aortic aneurysm or dissection. 2. Foci of atherosclerotic plaque in the common iliac arteries without hemodynamically significant obstruction. 50% diameter short-segment obstruction in the proximal left common femoral artery. No more severe obstruction noted. No aneurysm or dissection in pelvic arterial vessels. 3. Slight atherosclerotic plaque at the origin the celiac artery and origin of right renal artery. No more severe obstruction evident in the major mesenteric arterial vessels. No aneurysm or dissection involving mesenteric arterial vessels. No fibromuscular dysplasia. 4. Sigmoid diverticulosis. No diverticulitis. No bowel obstruction. No abscess in the abdomen or pelvis. Appendix appears normal. 5. No evident renal or ureteral calculus. No hydronephrosis. Urinary bladder  wall thickness normal. Electronically Signed   By: Lowella Grip III M.D.   On: 04/11/2019 10:07   Disposition   Pt is being discharged home today in good condition.  Follow-up Plans & Appointments    Follow-up Information    [name], MD Follow up.   Specialties: Cardiology, Radiology, Interventional Cardiology Why: follow up as needed Contact information: [address] [place] [address] [phone]        GI MD Follow up.   Why: please arrange follow up with your GI MD if symptoms persist.          Discharge Instructions    Call MD for:  redness, tenderness, or signs of infection (pain, swelling, redness, odor or green/yellow discharge around incision site)   Complete by: As directed    Diet - low sodium heart healthy   Complete by: As directed    Discharge instructions   Complete by: As directed    Radial Site Care Refer to this sheet in the next few weeks. These instructions provide you with  information on caring for yourself after your procedure. Your caregiver may also give you more specific instructions. Your treatment has been planned according to current medical practices, but problems sometimes occur. Call your caregiver if you have any problems or questions after your procedure. HOME CARE INSTRUCTIONS You may shower the day after the procedure.Remove the bandage (dressing) and gently wash the site with plain soap and water.Gently pat the site dry.  Do not apply powder or lotion to the site.  Do not submerge the affected site in water for 3 to 5 days.  Inspect the site at least twice daily.  Do not flex or bend the affected arm for 24 hours.  No lifting over 5 pounds (2.3 kg) for 5 days after your procedure.  Do not drive home if you are discharged the same day of the procedure. Have someone else drive you.  You may drive 24 hours after the procedure unless otherwise instructed by your caregiver.  What to expect: Any  bruising will usually fade within 1 to 2 weeks.  Blood that collects in the tissue (hematoma) may be painful to the touch. It should usually decrease in size and tenderness within 1 to 2 weeks.  SEEK IMMEDIATE MEDICAL CARE IF: You have unusual pain at the radial site.  You have redness, warmth, swelling, or pain at the radial site.  You have drainage (other than a small amount of blood on the dressing).  You have chills.  You have a fever or persistent symptoms for more than 72 hours.  You have a fever and your symptoms suddenly get worse.  Your arm becomes pale, cool, tingly, or numb.  You have heavy bleeding from the site. Hold pressure on the site.   Increase activity slowly   Complete by: As directed        Discharge Medications     Medication List    STOP taking these medications   methocarbamol 750 MG tablet Commonly known as: Robaxin-750     TAKE these medications   acetaminophen 500 MG tablet Commonly known as: TYLENOL Take 1,000 mg by mouth every 6 (six) hours as needed for mild pain or headache.   amLODipine 10 MG tablet Commonly known as: NORVASC Take 1 tablet (10 mg total) by mouth daily. What changed: how much to take   aspirin 81 MG tablet Take 1 tablet (81 mg total) by mouth daily.   atorvastatin 80 MG tablet Commonly known as: LIPITOR TAKE 1 TABLET(80 MG) BY MOUTH DAILY What changed: See the new instructions.   chlordiazePOXIDE 10 MG capsule Commonly known as: LIBRIUM Take 10-20 mg by mouth at bedtime as needed for anxiety.   clopidogrel 75 MG tablet Commonly known as: PLAVIX Take 1 tablet (75 mg total) by mouth daily.   Doxepin HCl 6 MG Tabs Take 6 mg by mouth at bedtime as needed for sleep.   lisinopril 5 MG tablet Commonly known as: ZESTRIL Take 5 mg by mouth daily. May take 1 additional tablet once daily for SBP greater than 150 once hour after taking daily dose   metoprolol succinate 25 MG 24 hr tablet Commonly known as: TOPROL-XL  Take 0.5 tablets (12.5 mg total) by mouth daily. Take with or immediately following a meal.   nitroGLYCERIN 0.4 MG SL tablet Commonly known as: NITROSTAT PLACE 1 TABLET UNDER THE TONGUE EVERY 5 MINUTES AS NEEDED FOR CHEST PAIN. 3 DOSES MAX What changed: See the new instructions.   ONE-A-DAY MENS 50+ ADVANTAGE PO Take  1 tablet by mouth daily.   pantoprazole 40 MG tablet Commonly known as: PROTONIX Take 40 mg by mouth daily.   ranitidine 150 MG tablet Commonly known as: ZANTAC Take 150 mg by mouth as needed for heartburn.   ranolazine 1000 MG SR tablet Commonly known as: RANEXA TAKE ONE TABLET BY MOUTH TWICE DAILY What changed: how much to take   Viibryd 10 MG Tabs Generic drug: Vilazodone HCl Take 20 mg by mouth daily.         Outstanding Labs/Studies   N/a   Duration of Discharge Encounter   Greater than 30 minutes including physician time.  Signed, Reino Bellis NP-C 04/12/2019, 11:53 AM

## 2019-04-20 IMAGING — DX DG CHEST 1V PORT
2 series · 2 of 2 positions shown · non-contrast
Comparison: 03/10/2017

CLINICAL DATA: Chest pain

EXAM:
PORTABLE CHEST 1 VIEW

[chest ap (1 of 2)]
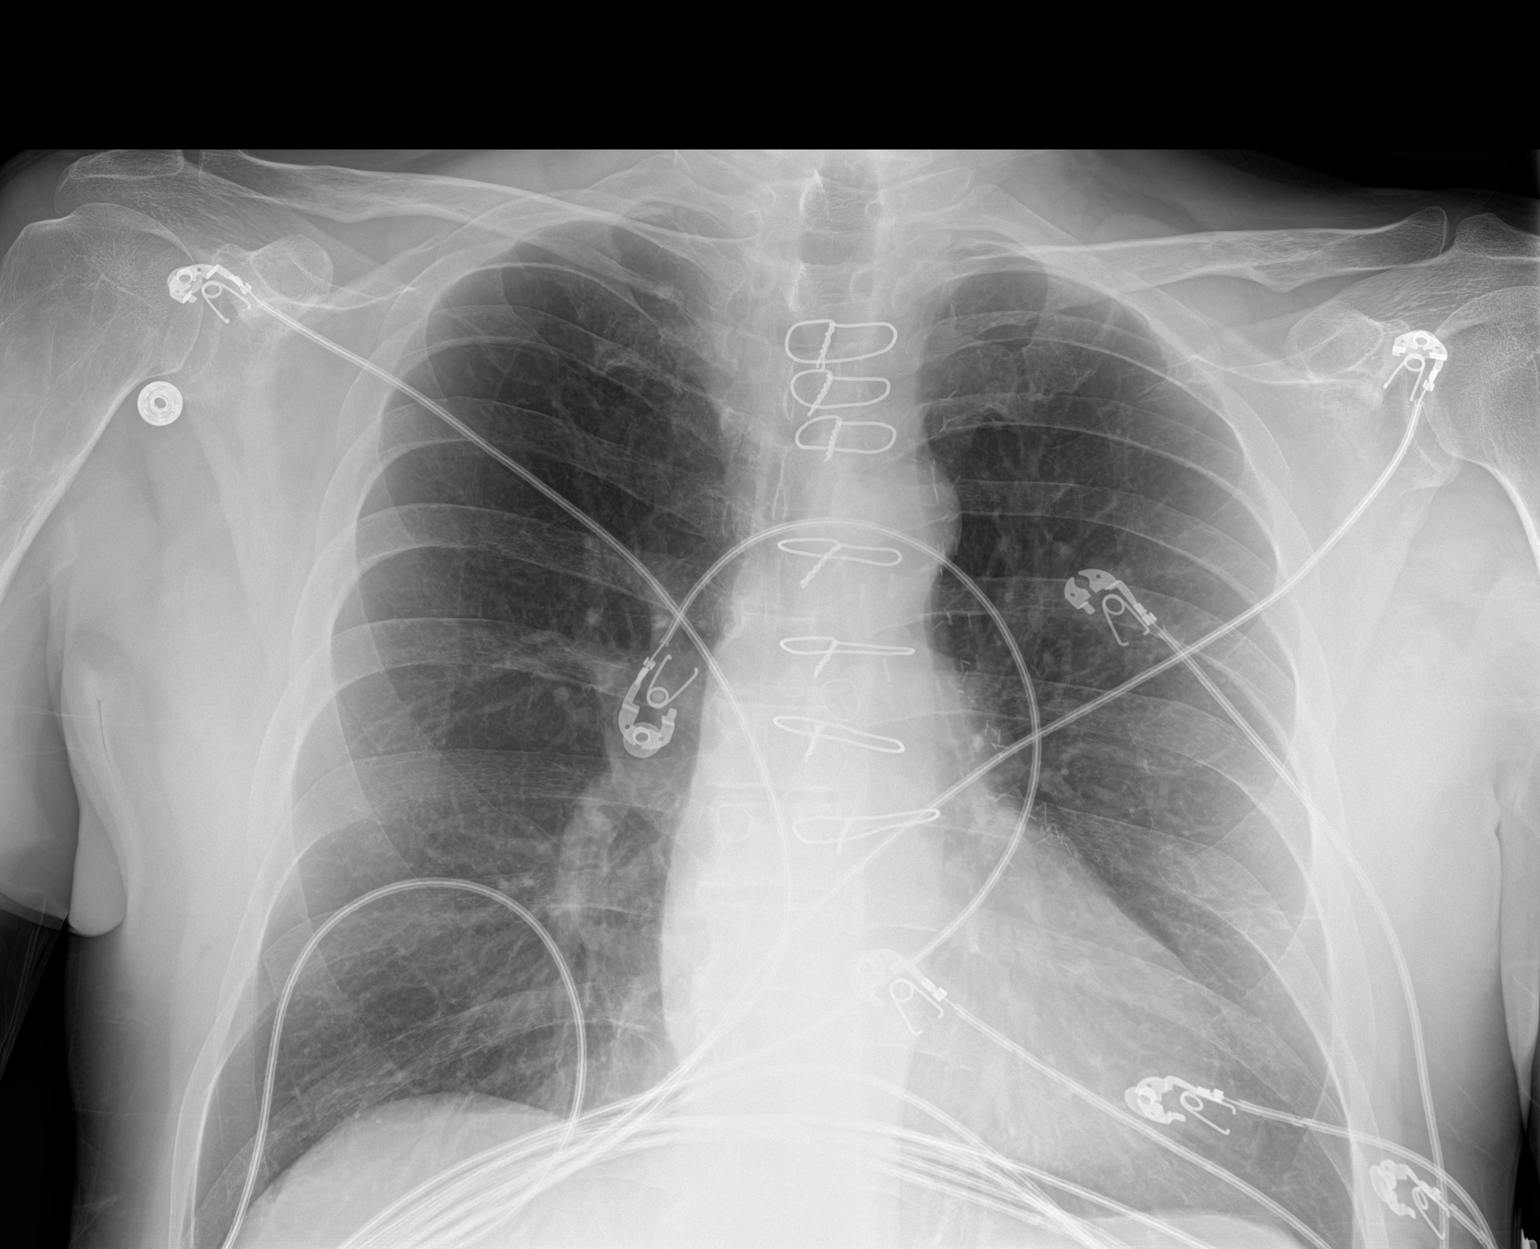

[chest ap (2 of 2)]
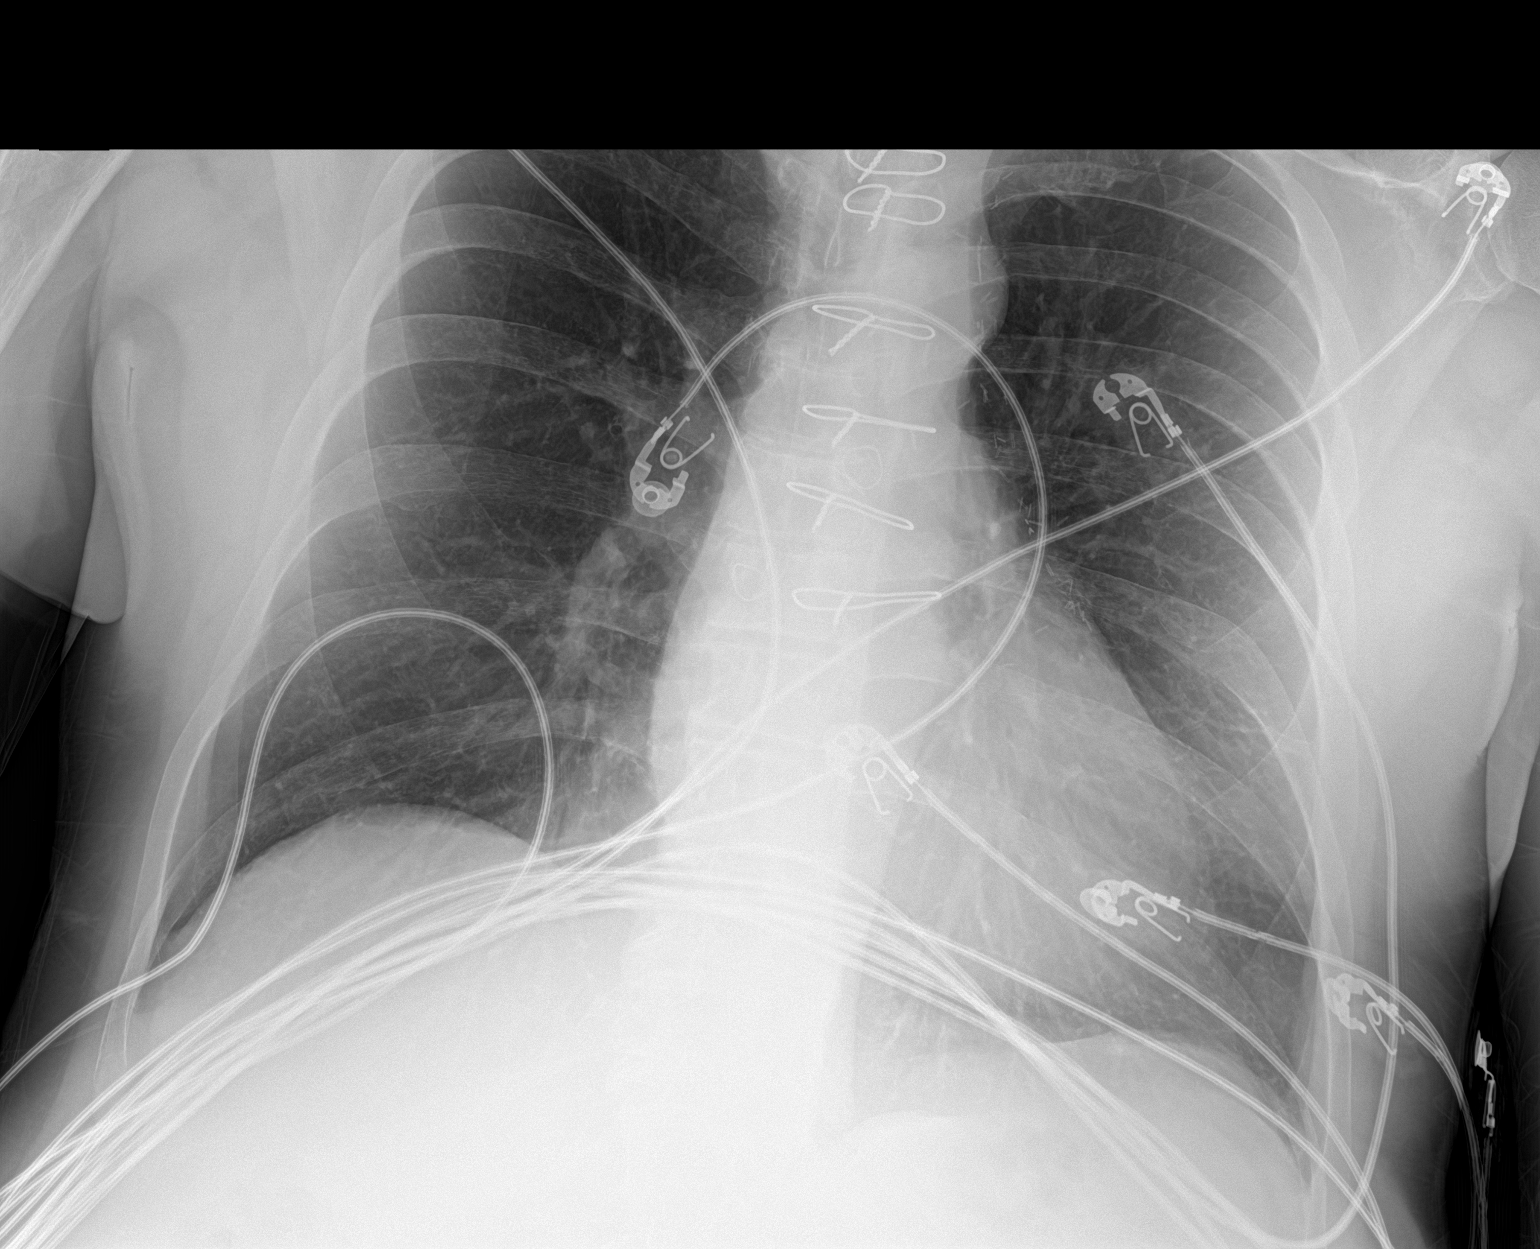

[2 of 2 positions shown; findings below may reference images not displayed]

FINDINGS: Prior CABG. Heart and mediastinal contours are within normal limits.
No focal opacities or effusions. No acute bony abnormality.
IMPRESSION: Prior CABG.  No active cardiopulmonary disease.

## 2019-04-27 NOTE — Progress Notes (Deleted)
Cardiology Office Note   Date:  04/27/2019   ID:  Anthony, Villa 1955-12-02, MRN [id number]  PCP:  [name], MD    No chief complaint on file.    Wt Readings from Last 3 Encounters:  04/12/19 190 lb 14.4 oz (86.6 kg)  02/07/19 190 lb (86.2 kg)  10/17/18 185 lb (83.9 kg)       History of Present Illness: [name] is a 63 y.o. male  with CAD, s/p anterior STEMI and emergent CABG in 2017.   He has had multiple episodes of chest pain since then and multiple caths in 2018 and a stress test not resulting in PCI.  He admits to having had some anxiety as well.  Medications have been difficult to tolerate for his anxiety at times.   In 2/19, he had a cath and PCI of the RCA due to narrowing of the graft to the distal RCA.   Since the RCA stent, he has felt much better.    In 03/2019, he had more chest pain which prompted cath:  Mid LAD lesion is 55% stenosed.  Ost LAD to Prox LAD lesion is 50% stenosed. LIMA to LAD is patent.  Ost Ramus lesion is 90% stenosed.  Ramus lesion is 75% stenosed.  Ost Cx lesion is 80% stenosed.  Prox Cx to Mid Cx lesion is 50% stenosed. SVG to OM is patent.  Dist RCA lesion is 50% stenosed.  RPDA lesion is 40% stenosed. SVG to PDA is patent with proximal disease that is unchanged.  Previously placed Ost RCA to Prox RCA drug eluting stent is widely patent.  Graft to the diagonal was occluded. There was TIMI 3 flow in the small diagonal with proximal ectasia.  The left ventricular systolic function is normal.  LV end diastolic pressure is normal. LVEDP 11 mm Hg.  The left ventricular ejection fraction is 50-55% by visual estimate.  There is no aortic valve stenosis.     Past Medical History:  Diagnosis Date  . Anxiety   . Arthritis   . Basal cell carcinoma (BCC) of forehead   . CAD (coronary artery disease)    a. 10/2015 ant STEMI >> LHC with 3 v CAD; oLAD tx with POBA >> emergent CABG. b. Multiple evals since  that time, early graft failure of SVG-RCA by cath 03/2016. c. 2/19 PCI/DES x1 to pRCA, normal EF.  . Carotid artery disease ([place])    a. 40-59% BICA 02/2018.  Marland Kitchen Depression   . Dyspnea   . Ectopic atrial tachycardia ([place])   . Esophageal reflux    eosinophil esophagitis  . Family history of adverse reaction to anesthesia    "sister has PONV" (06/21/2017)  . Former tobacco use   . Gout   . Hepatitis C    "treated and cured" (06/21/2017)  . High cholesterol   . History of kidney stones   . Hypertension   . Ischemic cardiomyopathy    a. EF 25-30% at intraop TEE 4/17  //  b. Limited Echo 5/17 - EF 45-50%, mild ant HK. c. EF 55-65% by cath 09/2017.  . Migraine    "3-4/yr" (06/21/2017)  . Myocardial infarction ([place]) 10/2015  . Palpitations   . Sinus bradycardia    a. HR dropping into 40s in 02/2016 -> BB reduced.  . Stroke ([place]) 10/2016   "small one; sometimes my memory/cognitive issues" (06/21/2017)  . Symptomatic hypotension    a. 02/2016 ER visit -> meds reduced.  Marland Kitchen  Syncope     Past Surgical History:  Procedure Laterality Date  . ANTERIOR CERVICAL DECOMP/DISCECTOMY FUSION N/A 10/17/2018   Procedure: Anterior Cervical Decompression Fusion - Cervical seven -Thoracic one;  Surgeon: Consuella Lose, MD;  Location: [facility];  Service: Neurosurgery;  Laterality: N/A;  . BASAL CELL CARCINOMA EXCISION     "forehead  . CARDIAC CATHETERIZATION N/A 11/28/2015   Procedure: Left Heart Cath and Coronary Angiography;  Surgeon: [name], MD;  Location: [facility];  Service: Cardiovascular;  Laterality: N/A;  . CARDIAC CATHETERIZATION N/A 11/28/2015   Procedure: Coronary Balloon Angioplasty;  Surgeon: [name], MD;  Location: [facility];  Service: Cardiovascular;  Laterality: N/A;  ostial LAD  . CARDIAC CATHETERIZATION N/A 11/28/2015   Procedure: Coronary/Graft Angiography;  Surgeon: [name], MD;  Location: [facility];  Service: Cardiovascular;   Laterality: N/A;  coronaries only   . CARDIAC CATHETERIZATION N/A 04/21/2016   Procedure: Left Heart Cath and Coronary Angiography;  Surgeon: Wellington Hampshire, MD;  Location: [facility];  Service: Cardiovascular;  Laterality: N/A;  . CARDIAC CATHETERIZATION N/A 06/14/2016   Procedure: Left Heart Cath and Cors/Grafts Angiography;  Surgeon: Lorretta Harp, MD;  Location: [facility];  Service: Cardiovascular;  Laterality: N/A;  . CARDIAC CATHETERIZATION N/A 09/08/2016   Procedure: Left Heart Cath and Cors/Grafts Angiography;  Surgeon: Wellington Hampshire, MD;  Location: [facility];  Service: Cardiovascular;  Laterality: N/A;  . CARDIAC CATHETERIZATION    . CORONARY ARTERY BYPASS GRAFT N/A 11/28/2015   Procedure: CORONARY ARTERY BYPASS GRAFTING (CABG) TIMES FIVE USING LEFT INTERNAL MAMMARY ARTERY AND RIGHT GREATER SAPHENOUS,VIEN HARVEATED BY ENDOVIEN, INTRAOPPRATIVE TEE;  Surgeon: Gaye Pollack, MD;  Location: [facility];  Service: Open Heart Surgery;  Laterality: N/A;  . CORONARY STENT INTERVENTION N/A 10/05/2017   Procedure: CORONARY STENT INTERVENTION;  Surgeon: [name], MD;  Location: [facility];  Service: Cardiovascular;  Laterality: N/A;  . HUMERUS SURGERY Right 1969   "tumor inside bone; filled it w/bone chips"  . LEFT HEART CATH AND CORS/GRAFTS ANGIOGRAPHY N/A 03/11/2017   Procedure: Left Heart Cath and Cors/Grafts Angiography;  Surgeon: Leonie Man, MD;  Location: [facility];  Service: Cardiovascular;  Laterality: N/A;  . LEFT HEART CATH AND CORS/GRAFTS ANGIOGRAPHY N/A 10/05/2017   Procedure: LEFT HEART CATH AND CORS/GRAFTS ANGIOGRAPHY;  Surgeon: [name], MD;  Location: [facility];  Service: Cardiovascular;  Laterality: N/A;  . LEFT HEART CATH AND CORS/GRAFTS ANGIOGRAPHY N/A 04/11/2019   Procedure: LEFT HEART CATH AND CORS/GRAFTS ANGIOGRAPHY;  Surgeon: [name], MD;  Location: [facility];  Service:  Cardiovascular;  Laterality: N/A;  . PERIPHERAL VASCULAR CATHETERIZATION N/A 06/14/2016   Procedure: Lower Extremity Angiography;  Surgeon: Lorretta Harp, MD;  Location: [facility];  Service: Cardiovascular;  Laterality: N/A;     Current Outpatient Medications  Medication Sig Dispense Refill  . acetaminophen (TYLENOL) 500 MG tablet Take 1,000 mg by mouth every 6 (six) hours as needed for mild pain or headache.     Marland Kitchen amLODipine (NORVASC) 10 MG tablet Take 1 tablet (10 mg total) by mouth daily. (Patient taking differently: Take 5 mg by mouth daily. ) 90 tablet 1  . aspirin 81 MG tablet Take 1 tablet (81 mg total) by mouth daily. 30 tablet   . atorvastatin (LIPITOR) 80 MG tablet TAKE 1 TABLET(80 MG) BY MOUTH  DAILY (Patient taking differently: Take 80 mg by mouth daily. ) 30 tablet 5  . chlordiazePOXIDE (LIBRIUM) 10 MG capsule Take 10-20 mg by mouth at bedtime as needed for anxiety.     . clopidogrel (PLAVIX) 75 MG tablet Take 1 tablet (75 mg total) by mouth daily. 90 tablet 2  . Doxepin HCl 6 MG TABS Take 6 mg by mouth at bedtime as needed for sleep.    Marland Kitchen lisinopril (ZESTRIL) 5 MG tablet Take 5 mg by mouth daily. May take 1 additional tablet once daily for SBP greater than 150 once hour after taking daily dose 135 tablet 2  . metoprolol succinate (TOPROL-XL) 25 MG 24 hr tablet Take 0.5 tablets (12.5 mg total) by mouth daily. Take with or immediately following a meal. 45 tablet 3  . Multiple Vitamins-Minerals (ONE-A-DAY MENS 50+ ADVANTAGE PO) Take 1 tablet by mouth daily.     . nitroGLYCERIN (NITROSTAT) 0.4 MG SL tablet PLACE 1 TABLET UNDER THE TONGUE EVERY 5 MINUTES AS NEEDED FOR CHEST PAIN. 3 DOSES MAX (Patient taking differently: Place 0.4 mg under the tongue every 5 (five) minutes as needed for chest pain. ) 25 tablet 4  . pantoprazole (PROTONIX) 40 MG tablet Take 40 mg by mouth daily.    . ranitidine (ZANTAC) 150 MG tablet Take 150 mg by mouth as needed for heartburn.    . ranolazine  (RANEXA) 1000 MG SR tablet TAKE ONE TABLET BY MOUTH TWICE DAILY (Patient taking differently: Take 500 mg by mouth 2 (two) times daily. ) 180 tablet 0  . VIIBRYD 10 MG TABS Take 20 mg by mouth daily.   12   No current facility-administered medications for this visit.     Allergies:   Prednisone, Tetanus toxoids, Wellbutrin [bupropion], Morphine and related, Scallops [shellfish allergy], and Chantix [varenicline]    Social History:  The patient  reports that he quit smoking about 3 years ago. His smoking use included cigarettes. He has a 33.00 pack-year smoking history. He has never used smokeless tobacco. He reports that he does not drink alcohol or use drugs.   Family History:  The patient's ***family history includes Heart Problems in his father; Heart attack in his maternal grandmother and paternal uncle; Heart attack (age of onset: 37) in his father; Heart failure in his father; Hypertension in his brother; Lung cancer in his mother; Stroke in his father and maternal grandmother.    ROS:  Please see the history of present illness.   Otherwise, review of systems are positive for ***.   All other systems are reviewed and negative.    PHYSICAL EXAM: VS:  There were no vitals taken for this visit. , BMI There is no height or weight on file to calculate BMI. GEN: Well nourished, well developed, in no acute distress  HEENT: normal  Neck: no JVD, carotid bruits, or masses Cardiac: ***RRR; no murmurs, rubs, or gallops,no edema  Respiratory:  clear to auscultation bilaterally, normal work of breathing GI: soft, nontender, nondistended, + BS MS: no deformity or atrophy  Skin: warm and dry, no rash Neuro:  Strength and sensation are intact Psych: euthymic mood, full affect   EKG:   The ekg ordered today demonstrates ***   Recent Labs: 06/09/2018: B Natriuretic Peptide 18.5 04/11/2019: ALT 31; BUN 26; Creatinine, Ser 1.18; Potassium 3.9; Sodium 140 04/12/2019: Hemoglobin 14.0; Platelets  222   Lipid Panel    Component Value Date/Time   CHOL 114 11/08/2017 0833   TRIG 93 11/08/2017  0833   HDL 52 11/08/2017 0833   CHOLHDL 2.2 11/08/2017 0833   CHOLHDL 2.2 06/22/2017 0256   VLDL 27 06/22/2017 0256   LDLCALC 43 11/08/2017 5749     Other studies Reviewed: Additional studies/ records that were reviewed today with results demonstrating: ***.   ASSESSMENT AND PLAN:  1. CAD: No significant change in anatomy in 03/2019.  2. Hyperlipidemia: 3. Old MI:   Current medicines are reviewed at length with the patient today.  The patient concerns regarding his medicines were addressed.  The following changes have been made:  No change***  Labs/ tests ordered today include: *** No orders of the defined types were placed in this encounter.   Recommend 150 minutes/week of aerobic exercise Low fat, low carb, high fiber diet recommended  Disposition:   FU in ***   Signed, Larae Grooms, MD  04/27/2019 1:13 PM    [facility] [address], [place], [place]  [address] Phone: [phone]; Fax: [phone]

## 2019-05-01 ENCOUNTER — Ambulatory Visit: Payer: BC Managed Care – PPO | Admitting: Interventional Cardiology

## 2019-05-13 IMAGING — US US ABDOMEN LIMITED
1 series · 14 of 25 positions shown · non-contrast
Comparison: None.

CLINICAL DATA: 60-year-old male with nausea vomiting.

EXAM:
ULTRASOUND ABDOMEN LIMITED RIGHT UPPER QUADRANT

[Series 1: us abdomen limited · 0.17mm/px · 14 of 50 slices shown]
[im 1/50]
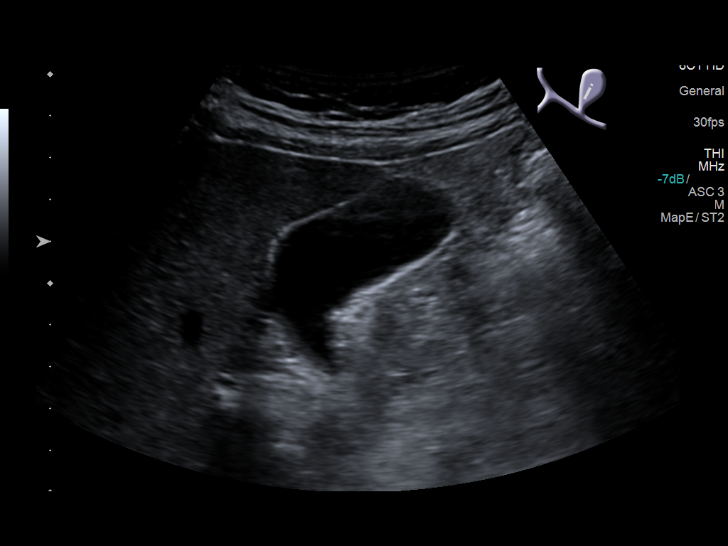
[im 5/50]
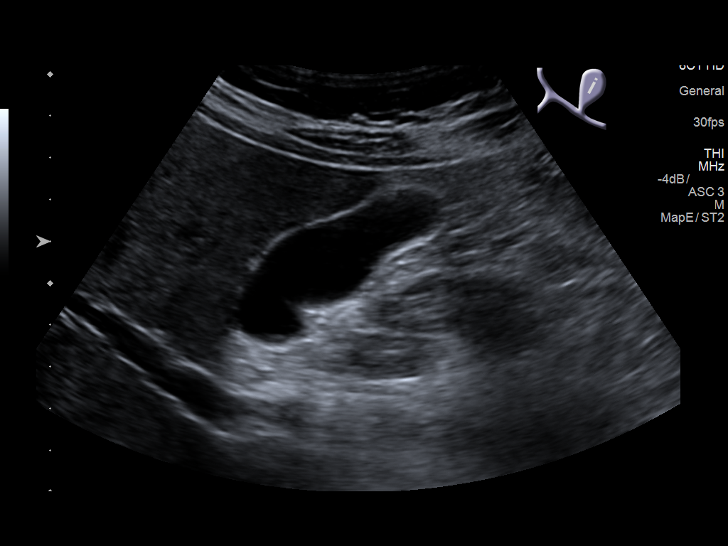
[im 9/50]
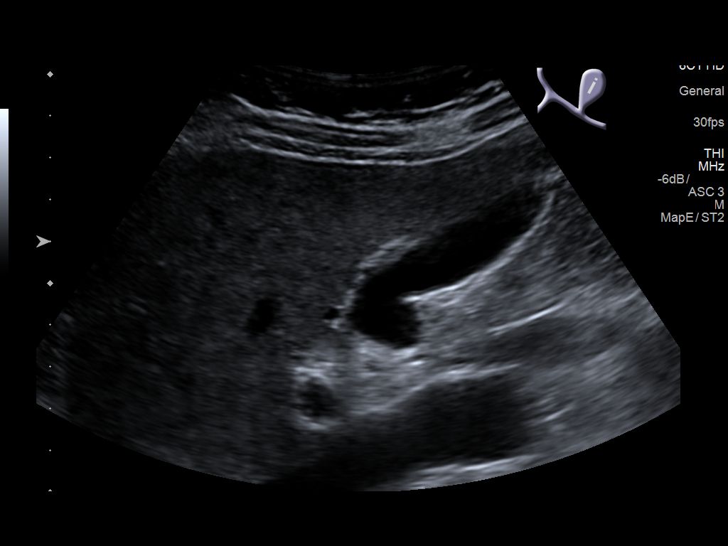
[im 13/50]
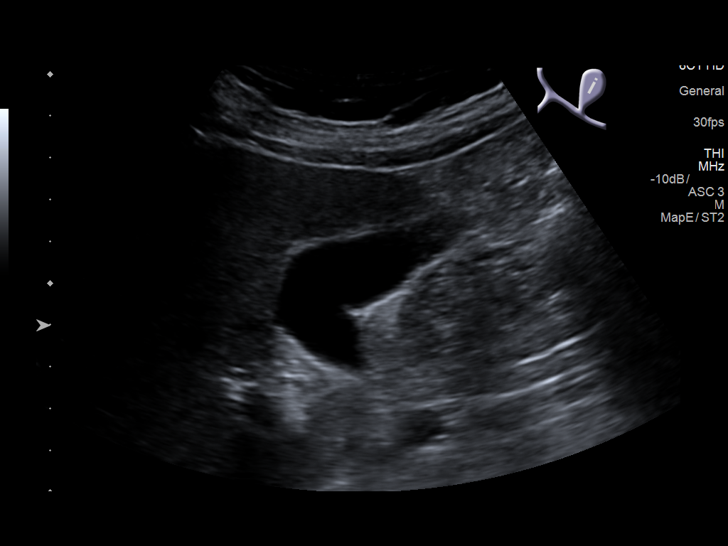
[im 17/50]
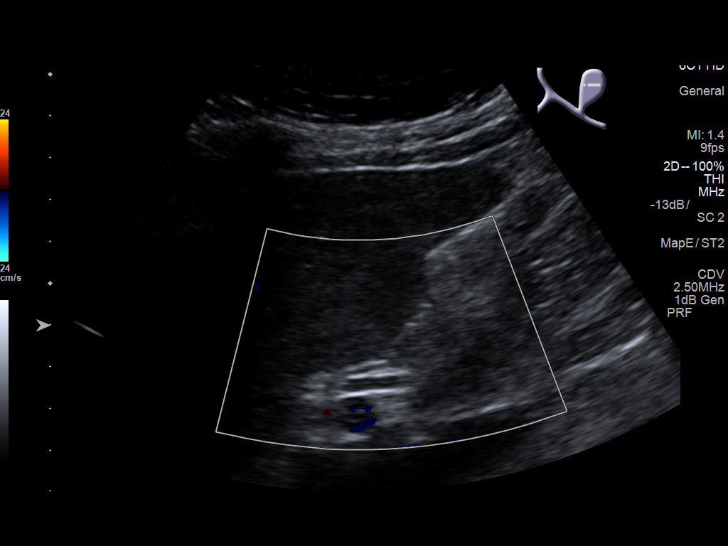
[im 19/50]
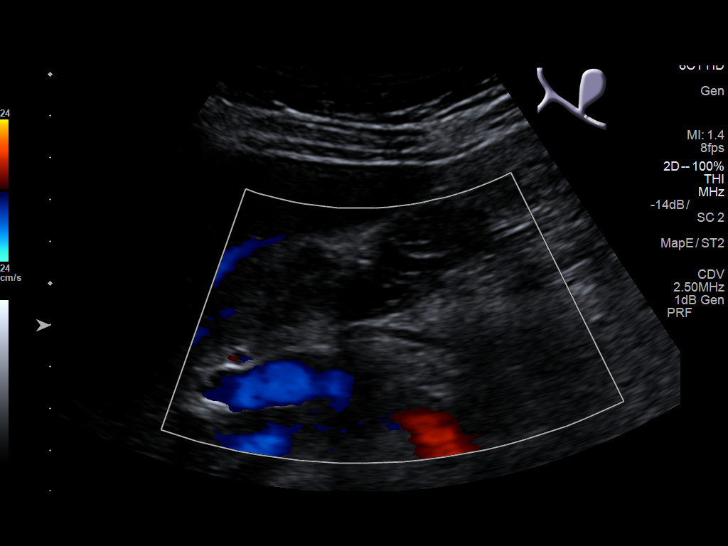
[im 23/50]
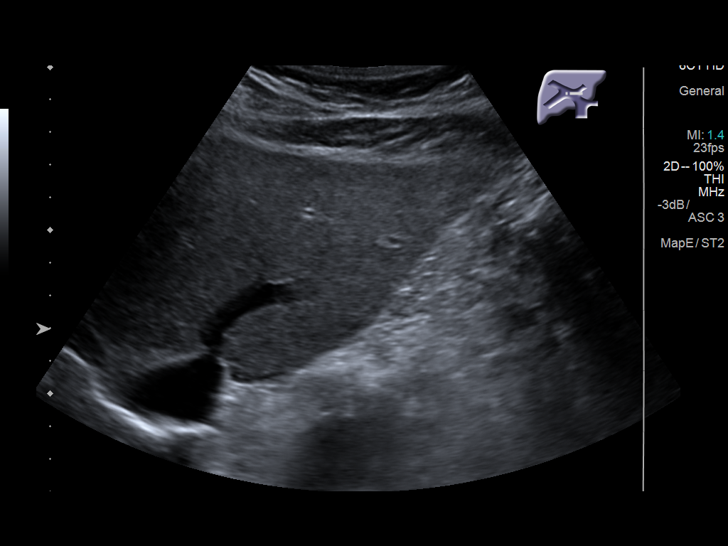
[im 27/50]
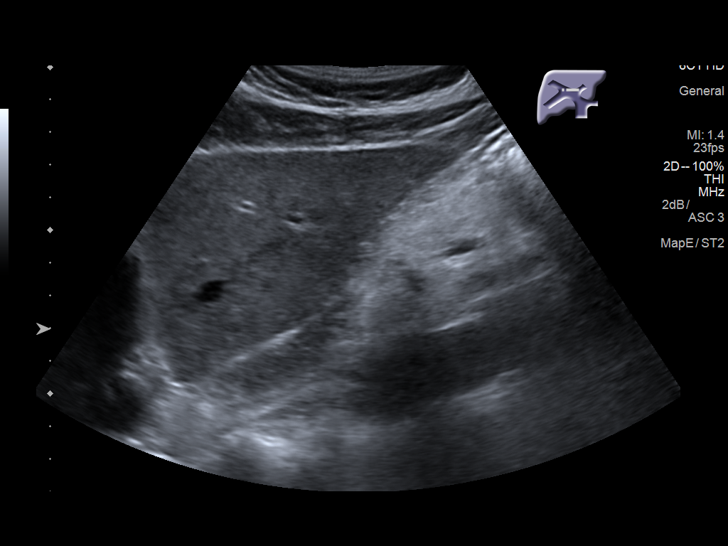
[im 31/50]
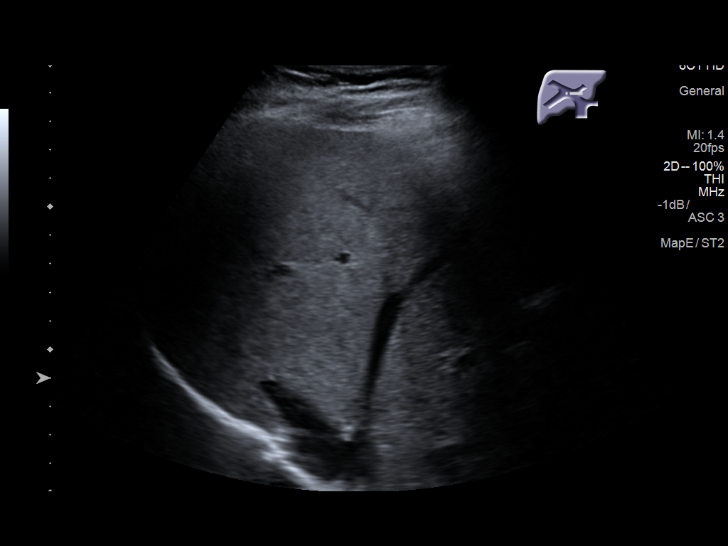
[im 33/50]
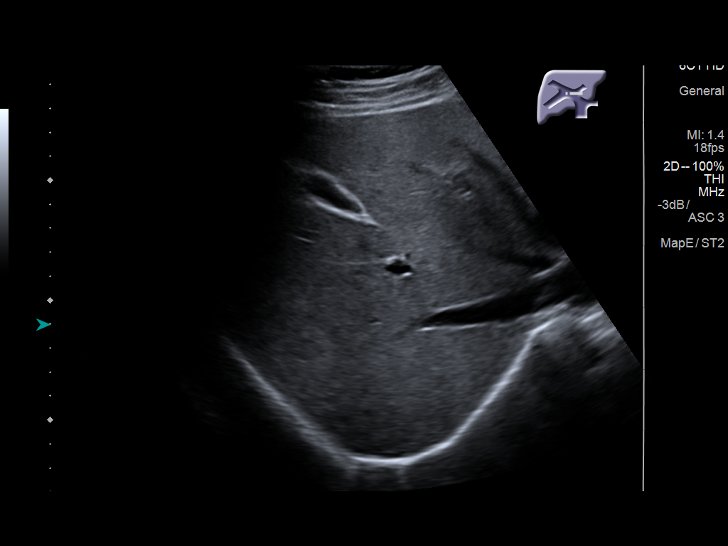
[im 37/50]
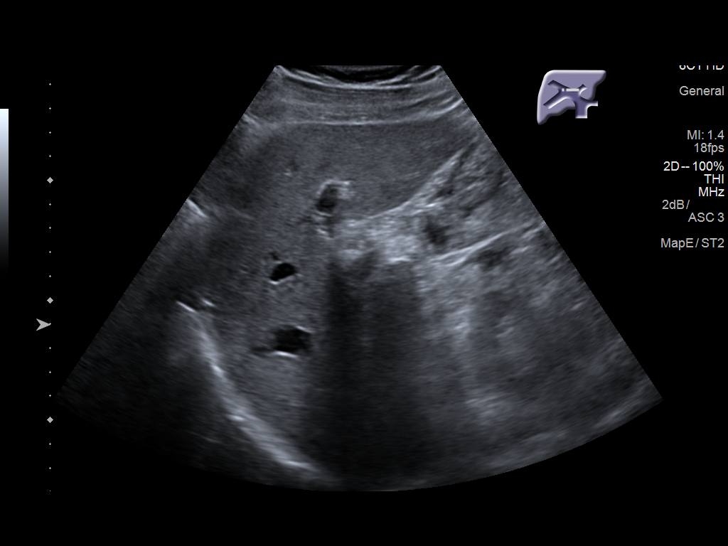
[im 41/50]
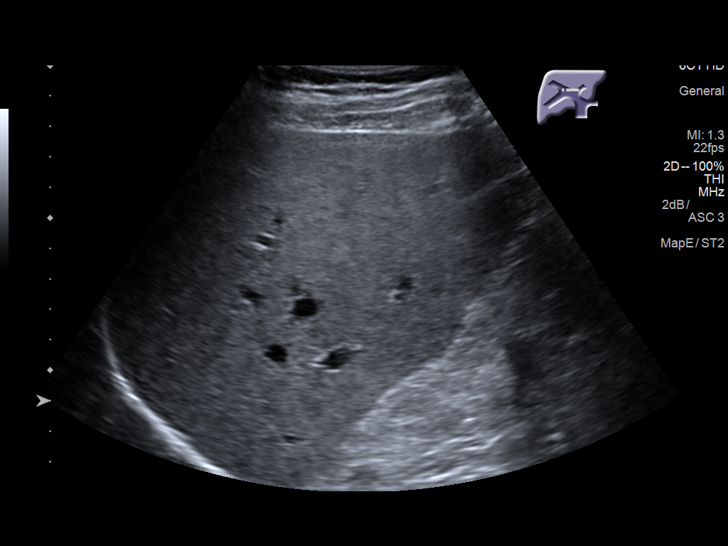
[im 45/50]
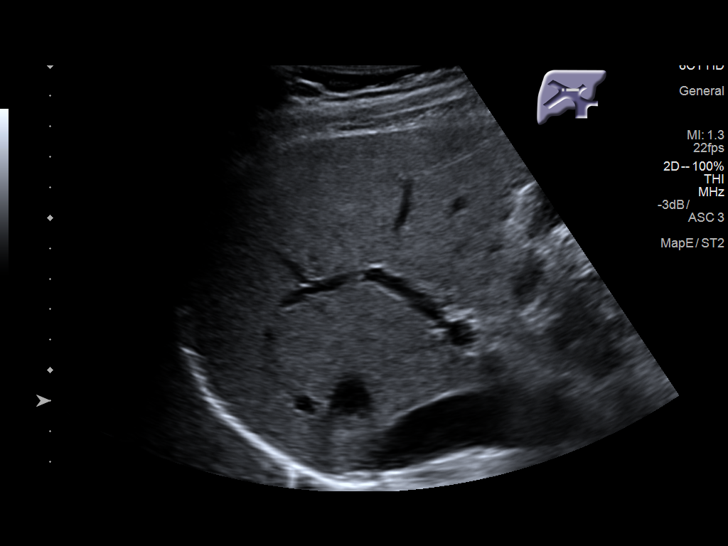
[im 50/50]
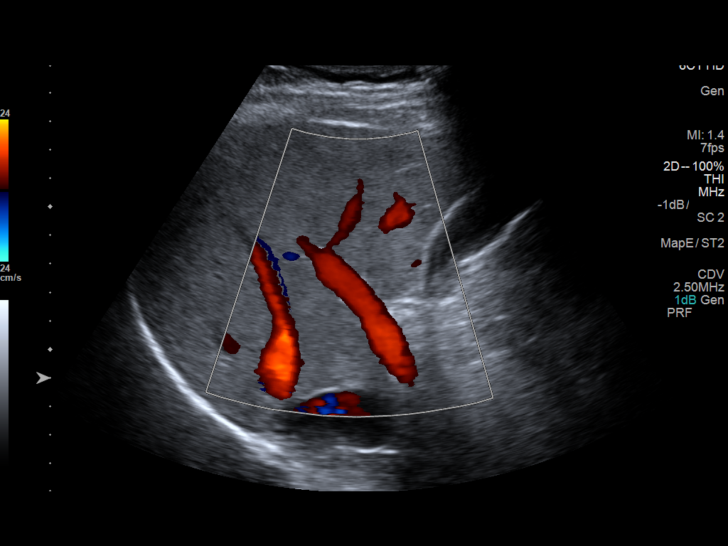

[14 of 25 positions shown; findings below may reference images not displayed]

FINDINGS: Gallbladder:

No gallstones or wall thickening visualized. No sonographic Murphy
sign noted by sonographer.

Common bile duct:

Diameter: 3 mm

Liver:

Unremarkable as visualized. The main portal vein is patent with
hepatopetal flow.
IMPRESSION: Unremarkable right upper quadrant ultrasound.

## 2019-05-22 ENCOUNTER — Telehealth: Payer: Self-pay | Admitting: *Deleted

## 2019-05-22 NOTE — Telephone Encounter (Signed)
   [facility] Pre-operative Risk Assessment    Request for surgical clearance:  1. What type of surgery is being performed? EGD   2. When is this surgery scheduled? 06/21/19   3. What type of clearance is required (medical clearance vs. Pharmacy clearance to hold med vs. Both)? MEDICAL  4. Are there any medications that need to be held prior to surgery and how long? PLAVIX   5. Practice name and name of physician performing surgery? [facility]; DR. JEFFREY MEDOFF  6. What is your office phone number [phone]    7.   What is your office fax number [phone]  8.   Anesthesia type (None, local, MAC, general) ? NONE LISTED; PROPOFOL ?    Julaine Hua 05/22/2019, 3:32 PM  _________________________________________________________________   (provider comments below)

## 2019-05-23 NOTE — Telephone Encounter (Signed)
Dr. Irish Lack to review, recent underwent cath for possible unstable angina, this showed chronically occluded SVG to diagonal, patent grafts otherwise. Continue medical therapy was recommended. Last PCI was 10/05/2017.   Dr. Irish Lack, would you be ok with the patient holding plavix for 5-7 days prior to EGD?

## 2019-05-24 NOTE — Telephone Encounter (Signed)
Left VM

## 2019-05-24 NOTE — Telephone Encounter (Signed)
Yes. Ok to hold PLavix 5 days prior to EGD.

## 2019-05-25 NOTE — Telephone Encounter (Signed)
Follow up ° ° °Patient is returning your call. Please call. ° ° ° °

## 2019-05-25 NOTE — Telephone Encounter (Signed)
Left message for the patient to call back and speak to the Preop APP of the day

## 2019-05-25 NOTE — Telephone Encounter (Signed)
   Primary Cardiologist: Larae Grooms, MD  Chart reviewed as part of pre-operative protocol coverage. Given past medical history and time since last visit, based on ACC/AHA guidelines, [name] would be at acceptable risk for the planned procedure without further cardiovascular testing.   I will route this recommendation to the requesting party via Epic fax function and remove from pre-op pool.  Please call with questions. Per Dr. Irish Lack, ok to hold plavix for 5 days prior to the procedure and restart as soon as possible afterward at the surgeon's discretion.  [place], [place] 05/25/2019, 11:35 AM

## 2019-06-25 ENCOUNTER — Other Ambulatory Visit: Payer: Self-pay | Admitting: Gastroenterology

## 2019-06-25 ENCOUNTER — Telehealth: Payer: Self-pay | Admitting: *Deleted

## 2019-06-25 NOTE — Telephone Encounter (Signed)
Our office received 2nd clearance form for same procedure that was cleared as of 05/25/19 , see 05/22/19 clearance notes. I left message for surgery scheduler for Dr. Benson Norway needing to confirm is this an actual 2nd request for another EGD, was EGD not done on 06/21/19? I will place this request in as well and send to preop team for evaluation.       [facility] Pre-operative Risk Assessment    Request for surgical clearance:  1. What type of surgery is being performed? EGD   2. When is this surgery scheduled? 07/20/19   3. What type of clearance is required (medical clearance vs. Pharmacy clearance to hold med vs. Both)? MEDICAL  4. Are there any medications that need to be held prior to surgery and how long? PLAVIX    5. Practice name and name of physician performing surgery? [facility]; DR. HUNG   6. What is your office phone number [phone]    7.   What is your office fax number [phone]  8.   Anesthesia type (None, local, MAC, general) ? PROPOFOL    Julaine Hua 06/25/2019, 4:21 PM  _________________________________________________________________   (provider comments below)

## 2019-06-25 NOTE — Telephone Encounter (Signed)
Left VM for patient for clarification - see Ms Fiato's note.

## 2019-06-26 NOTE — Telephone Encounter (Signed)
Donnelly Stager sent to Michae Kava, CMA        I received your message about Mr. Gueye clearance, yesterday was the first time we have seen the patient. Apparently he was previously scheduled with Dr. Earlean Shawl for the same procedure. We can use the original clearance.

## 2019-06-26 NOTE — Telephone Encounter (Signed)
I have refaxed the previous cardiac clearance letter to Dr. Ulyses Amor office

## 2019-07-17 ENCOUNTER — Encounter (HOSPITAL_BASED_OUTPATIENT_CLINIC_OR_DEPARTMENT_OTHER): Payer: Self-pay | Admitting: Emergency Medicine

## 2019-07-17 ENCOUNTER — Emergency Department (HOSPITAL_BASED_OUTPATIENT_CLINIC_OR_DEPARTMENT_OTHER)
Admission: EM | Admit: 2019-07-17 | Discharge: 2019-07-17 | Disposition: A | Discharge status: Home / Self Care | Payer: BC Managed Care – PPO | Attending: Emergency Medicine | Admitting: Emergency Medicine

## 2019-07-17 ENCOUNTER — Other Ambulatory Visit: Payer: Self-pay

## 2019-07-17 ENCOUNTER — Emergency Department (HOSPITAL_BASED_OUTPATIENT_CLINIC_OR_DEPARTMENT_OTHER): Payer: BC Managed Care – PPO

## 2019-07-17 DIAGNOSIS — Z87891 Personal history of nicotine dependence: Secondary | ICD-10-CM | POA: Diagnosis not present

## 2019-07-17 DIAGNOSIS — Z20828 Contact with and (suspected) exposure to other viral communicable diseases: Secondary | ICD-10-CM | POA: Insufficient documentation

## 2019-07-17 DIAGNOSIS — Z79899 Other long term (current) drug therapy: Secondary | ICD-10-CM | POA: Insufficient documentation

## 2019-07-17 DIAGNOSIS — I10 Essential (primary) hypertension: Secondary | ICD-10-CM | POA: Insufficient documentation

## 2019-07-17 DIAGNOSIS — I2510 Atherosclerotic heart disease of native coronary artery without angina pectoris: Secondary | ICD-10-CM | POA: Insufficient documentation

## 2019-07-17 DIAGNOSIS — R1013 Epigastric pain: Secondary | ICD-10-CM | POA: Diagnosis present

## 2019-07-17 DIAGNOSIS — R1084 Generalized abdominal pain: Secondary | ICD-10-CM | POA: Diagnosis not present

## 2019-07-17 LAB — COMPREHENSIVE METABOLIC PANEL
ALT: 44 U/L (ref 0–44)
AST: 41 U/L (ref 15–41)
Albumin: 4 g/dL (ref 3.5–5.0)
Alkaline Phosphatase: 48 U/L (ref 38–126)
Anion gap: 7 (ref 5–15)
BUN: 16 mg/dL (ref 8–23)
CO2: 23 mmol/L (ref 22–32)
Calcium: 9.2 mg/dL (ref 8.9–10.3)
Chloride: 107 mmol/L (ref 98–111)
Creatinine, Ser: 1.01 mg/dL (ref 0.61–1.24)
GFR calc Af Amer: >60 mL/min (ref >60)
GFR calc non Af Amer: >60 mL/min (ref >60)
Glucose, Bld: 106 mg/dL — ABNORMAL HIGH (ref 70–99)
Potassium: 4.2 mmol/L (ref 3.5–5.1)
Sodium: 137 mmol/L (ref 135–145)
Total Bilirubin: 0.7 mg/dL (ref 0.3–1.2)
Total Protein: 7.3 g/dL (ref 6.5–8.1)

## 2019-07-17 LAB — URINALYSIS, ROUTINE W REFLEX MICROSCOPIC
Bilirubin Urine: NEGATIVE
Glucose, UA: NEGATIVE mg/dL
Hgb urine dipstick: NEGATIVE
Ketones, ur: NEGATIVE mg/dL
Leukocytes,Ua: NEGATIVE
Nitrite: NEGATIVE
Protein, ur: NEGATIVE mg/dL
Specific Gravity, Urine: 1.02 (ref 1.005–1.030)
pH: 5.5 (ref 5.0–8.0)

## 2019-07-17 LAB — CBC WITH DIFFERENTIAL/PLATELET
Abs Immature Granulocytes: 0.04 10*3/uL (ref 0.00–0.07)
Basophils Absolute: 0 10*3/uL (ref 0.0–0.1)
Basophils Relative: 1 %
Eosinophils Absolute: 0.2 10*3/uL (ref 0.0–0.5)
Eosinophils Relative: 3 %
HCT: 45.1 % (ref 39.0–52.0)
Hemoglobin: 14.8 g/dL (ref 13.0–17.0)
Immature Granulocytes: 1 %
Lymphocytes Relative: 28 %
Lymphs Abs: 2.1 10*3/uL (ref 0.7–4.0)
MCH: 30.6 pg (ref 26.0–34.0)
MCHC: 32.8 g/dL (ref 30.0–36.0)
MCV: 93.2 fL (ref 80.0–100.0)
Monocytes Absolute: 0.8 10*3/uL (ref 0.1–1.0)
Monocytes Relative: 10 %
Neutro Abs: 4.2 10*3/uL (ref 1.7–7.7)
Neutrophils Relative %: 57 %
Platelets: 224 10*3/uL (ref 150–400)
RBC: 4.84 MIL/uL (ref 4.22–5.81)
RDW: 12.5 % (ref 11.5–15.5)
WBC: 7.4 10*3/uL (ref 4.0–10.5)
nRBC: 0 % (ref 0.0–0.2)

## 2019-07-17 LAB — LIPASE, BLOOD: Lipase: 23 U/L (ref 11–51)

## 2019-07-17 LAB — SARS CORONAVIRUS 2 (TAT 6-24 HRS): SARS Coronavirus 2: NEGATIVE

## 2019-07-17 MED ORDER — ALUM & MAG HYDROXIDE-SIMETH 200-200-20 MG/5ML PO SUSP
30.0000 mL | Freq: Once | ORAL | Status: AC
  Administered 2019-07-17: 30 mL via ORAL
  Filled 2019-07-17: qty 30

## 2019-07-17 MED ORDER — ONDANSETRON HCL 4 MG PO TABS: 4.0000 mg | ORAL_TABLET | Freq: Four times a day (QID) | ORAL | 0 refills | Status: DC

## 2019-07-17 MED ORDER — LIDOCAINE VISCOUS HCL 2 % MT SOLN
15.0000 mL | Freq: Once | OROMUCOSAL | Status: AC
  Administered 2019-07-17: 15 mL via ORAL
  Filled 2019-07-17: qty 15

## 2019-07-17 MED ORDER — ONDANSETRON HCL 4 MG/2ML IJ SOLN
4.0000 mg | Freq: Once | INTRAMUSCULAR | Status: AC
  Administered 2019-07-17: 4 mg via INTRAVENOUS
  Filled 2019-07-17: qty 2

## 2019-07-17 MED ORDER — FENTANYL CITRATE (PF) 100 MCG/2ML IJ SOLN
50.0000 ug | Freq: Once | INTRAMUSCULAR | Status: AC
  Administered 2019-07-17: 50 ug via INTRAVENOUS
  Filled 2019-07-17: qty 2

## 2019-07-17 MED ORDER — SODIUM CHLORIDE 0.9 % IV BOLUS
1000.0000 mL | Freq: Once | INTRAVENOUS | Status: AC
  Administered 2019-07-17: 1000 mL via INTRAVENOUS

## 2019-07-17 MED ORDER — IOHEXOL 300 MG/ML  SOLN
100.0000 mL | Freq: Once | INTRAMUSCULAR | Status: AC | PRN
  Administered 2019-07-17: 100 mL via INTRAVENOUS

## 2019-07-17 MED ORDER — HYDROCODONE-ACETAMINOPHEN 5-325 MG PO TABS: 1.0000 | ORAL_TABLET | Freq: Four times a day (QID) | ORAL | 0 refills | Status: DC | PRN

## 2019-07-17 MED FILL — ONDANSETRON HCL 4 MG TABLET: 4 | 3 days supply | Qty: 12 | Fill #0

## 2019-07-17 MED FILL — HYDROCODON-APAP 5-325: 5-325 | 3 days supply | Qty: 10 | Fill #0

## 2019-07-17 NOTE — ED Triage Notes (Signed)
Pt states he is supposed to have his stomach scoped on Friday and was going to get up today and go get his covid test for the procedure  Pt states when he got up he felt bad  Pt is c/o upper abd pain with nausea and has had one episode of vomiting  Pt states he has a terrible headache and feels a little short of breath

## 2019-07-17 NOTE — ED Notes (Signed)
Pt verbalized understanding of dc instructions.

## 2019-07-17 NOTE — ED Notes (Signed)
ED Provider at bedside. 

## 2019-07-17 NOTE — ED Provider Notes (Signed)
[facility] EMERGENCY DEPARTMENT Provider Note   CSN: [id number] Arrival date & time: 07/17/19  0645     History   Chief Complaint Chief Complaint  Patient presents with  . Emesis  . Abdominal Pain    HPI [name] is a 63 y.o. male.     The history is provided by the patient.  Abdominal Pain Pain location:  Epigastric Pain quality: aching and cramping   Pain radiates to:  Does not radiate Pain severity:  Mild Onset quality:  Gradual Timing:  Intermittent Progression:  Waxing and waning Chronicity:  Recurrent Context: not previous surgeries and not sick contacts   Context comment:  Patient to have scope this week for reflux type symtpoms.  Relieved by:  OTC medications Worsened by:  Nothing Associated symptoms: cough and nausea   Associated symptoms: no anorexia, no belching, no chest pain, no chills, no constipation, no dysuria, no fatigue, no fever, no hematuria, no shortness of breath, no sore throat and no vomiting   Risk factors: no alcohol abuse, has not had multiple surgeries and no NSAID use     Past Medical History:  Diagnosis Date  . Anxiety   . Arthritis   . Basal cell carcinoma (BCC) of forehead   . CAD (coronary artery disease)    a. 10/2015 ant STEMI >> LHC with 3 v CAD; oLAD tx with POBA >> emergent CABG. b. Multiple evals since that time, early graft failure of SVG-RCA by cath 03/2016. c. 2/19 PCI/DES x1 to pRCA, normal EF.  . Carotid artery disease ([place])    a. 40-59% BICA 02/2018.  Marland Kitchen Depression   . Dyspnea   . Ectopic atrial tachycardia ([place])   . Esophageal reflux    eosinophil esophagitis  . Family history of adverse reaction to anesthesia    "sister has PONV" (06/21/2017)  . Former tobacco use   . Gout   . Hepatitis C    "treated and cured" (06/21/2017)  . High cholesterol   . History of kidney stones   . Hypertension   . Ischemic cardiomyopathy    a. EF 25-30% at intraop TEE 4/17  //  b. Limited Echo 5/17 - EF 45-50%,  mild ant HK. c. EF 55-65% by cath 09/2017.  . Migraine    "3-4/yr" (06/21/2017)  . Myocardial infarction ([place]) 10/2015  . Palpitations   . Sinus bradycardia    a. HR dropping into 40s in 02/2016 -> BB reduced.  . Stroke ([place]) 10/2016   "small one; sometimes my memory/cognitive issues" (06/21/2017)  . Symptomatic hypotension    a. 02/2016 ER visit -> meds reduced.  . Syncope     Patient Active Problem List   Diagnosis Date Noted  . Gastroesophageal reflux disease   . Cervical radiculopathy 10/17/2018  . Preoperative clearance 10/04/2018  . Palpitations   . Coronary artery disease involving coronary bypass graft of native heart with angina pectoris ([place])   . Transient loss of consciousness 03/24/2018  . Ectopic atrial tachycardia ([place]) 02/09/2018  . Central chest pain 03/10/2017  . Family hx-stroke 11/10/2016  . Stroke-like episode ([place]) - R brain, s/p tPA 11/09/2016  . Unstable angina ([place]) 09/07/2016  . Claudication of both lower extremities ([place])   . Pure hypercholesterolemia   . Tobacco abuse disorder   . CAD of autologous artery bypass graft without angina   . Chest pain at rest 06/10/2016  . Abnormal nuclear stress test - HIGH RISK 04/20/2016  . Old MI (myocardial infarction)   .  Essential hypertension 02/26/2016  . Ischemic cardiomyopathy 12/25/2015  . Hyperlipidemia LDL goal <70 12/25/2015  . 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 Coronary artery disease involving native coronary artery of native heart with unstable angina pectoris ([place]) 11/28/2015  . 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 Acute MI anterior wall first episode care [facility])   . Precordial chest pain 03/07/2015  . Dysphagia 03/07/2015  . Gout attack 03/07/2015  . Mixed bipolar I disorder ([place]) 03/07/2015  . Fibromyalgia 07/09/2014  . Gout 07/09/2014  . Anxiety 07/09/2014  . Depression 07/09/2014  . Hepatitis C 11/20/2012  . Eosinophilic esophagitis 02/58/5277    Past Surgical History:  Procedure  Laterality Date  . ANTERIOR CERVICAL DECOMP/DISCECTOMY FUSION N/A 10/17/2018   Procedure: Anterior Cervical Decompression Fusion - Cervical seven -Thoracic one;  Surgeon: Consuella Lose, MD;  Location: [facility];  Service: Neurosurgery;  Laterality: N/A;  . BASAL CELL CARCINOMA EXCISION     "forehead  . CARDIAC CATHETERIZATION N/A 11/28/2015   Procedure: Left Heart Cath and Coronary Angiography;  Surgeon: [name], MD;  Location: [facility] LAB;  Service: Cardiovascular;  Laterality: N/A;  . CARDIAC CATHETERIZATION N/A 11/28/2015   Procedure: Coronary Balloon Angioplasty;  Surgeon: [name], MD;  Location: [facility] LAB;  Service: Cardiovascular;  Laterality: N/A;  ostial LAD  . CARDIAC CATHETERIZATION N/A 11/28/2015   Procedure: Coronary/Graft Angiography;  Surgeon: [name], MD;  Location: [facility] CV LAB;  Service: Cardiovascular;  Laterality: N/A;  coronaries only   . CARDIAC CATHETERIZATION N/A 04/21/2016   Procedure: Left Heart Cath and Coronary Angiography;  Surgeon: Wellington Hampshire, MD;  Location: [facility] LAB;  Service: Cardiovascular;  Laterality: N/A;  . CARDIAC CATHETERIZATION N/A 06/14/2016   Procedure: Left Heart Cath and Cors/Grafts Angiography;  Surgeon: Lorretta Harp, MD;  Location: [facility] LAB;  Service: Cardiovascular;  Laterality: N/A;  . CARDIAC CATHETERIZATION N/A 09/08/2016   Procedure: Left Heart Cath and Cors/Grafts Angiography;  Surgeon: Wellington Hampshire, MD;  Location: [facility] CV LAB;  Service: Cardiovascular;  Laterality: N/A;  . CARDIAC CATHETERIZATION    . CORONARY ARTERY BYPASS GRAFT N/A 11/28/2015   Procedure: CORONARY ARTERY BYPASS GRAFTING (CABG) TIMES FIVE USING LEFT INTERNAL MAMMARY ARTERY AND RIGHT GREATER SAPHENOUS,VIEN HARVEATED BY ENDOVIEN, INTRAOPPRATIVE TEE;  Surgeon: Gaye Pollack, MD;  Location: [facility];  Service: Open Heart Surgery;  Laterality: N/A;  . CORONARY STENT INTERVENTION N/A 10/05/2017    Procedure: CORONARY STENT INTERVENTION;  Surgeon: [name], MD;  Location: [facility] CV LAB;  Service: Cardiovascular;  Laterality: N/A;  . HUMERUS SURGERY Right 1969   "tumor inside bone; filled it w/bone chips"  . LEFT HEART CATH AND CORS/GRAFTS ANGIOGRAPHY N/A 03/11/2017   Procedure: Left Heart Cath and Cors/Grafts Angiography;  Surgeon: Leonie Man, MD;  Location: [facility] LAB;  Service: Cardiovascular;  Laterality: N/A;  . LEFT HEART CATH AND CORS/GRAFTS ANGIOGRAPHY N/A 10/05/2017   Procedure: LEFT HEART CATH AND CORS/GRAFTS ANGIOGRAPHY;  Surgeon: [name], MD;  Location: [facility] CV LAB;  Service: Cardiovascular;  Laterality: N/A;  . LEFT HEART CATH AND CORS/GRAFTS ANGIOGRAPHY N/A 04/11/2019   Procedure: LEFT HEART CATH AND CORS/GRAFTS ANGIOGRAPHY;  Surgeon: [name], MD;  Location: [facility] LAB;  Service: Cardiovascular;  Laterality: N/A;  . PERIPHERAL VASCULAR CATHETERIZATION N/A 06/14/2016   Procedure: Lower Extremity Angiography;  Surgeon: Lorretta Harp, MD;  Location: [facility]  INVASIVE CV LAB;  Service: Cardiovascular;  Laterality: N/A;        Home Medications    Prior to Admission medications   Medication Sig Start Date End Date Taking? Authorizing Provider  acetaminophen (TYLENOL) 500 MG tablet Take 1,000 mg by mouth every 6 (six) hours as needed for mild pain or headache.     [provider]  amLODipine (NORVASC) 10 MG tablet Take 1 tablet (10 mg total) by mouth daily. 03/07/19   [name], MD  aspirin 81 MG tablet Take 1 tablet (81 mg total) by mouth daily. 10/24/18   Costella, Vista Mink, PA-C  atorvastatin (LIPITOR) 80 MG tablet TAKE 1 TABLET(80 MG) BY MOUTH DAILY Patient taking differently: Take 80 mg by mouth daily.  09/21/18   [name], MD  chlordiazePOXIDE (LIBRIUM) 10 MG capsule Take 10-20 mg by mouth 3 (three) times daily as needed for anxiety.     [provider]  clopidogrel  (PLAVIX) 75 MG tablet Take 1 tablet (75 mg total) by mouth daily. 10/24/18   Costella, Vista Mink, PA-C  Doxepin HCl 6 MG TABS Take 6 mg by mouth at bedtime as needed for sleep. 02/03/19   [provider]  HYDROcodone-acetaminophen (NORCO) 5-325 MG tablet Take 1 tablet by mouth every 6 (six) hours as needed for up to 10 doses for moderate pain. 07/17/19   Eastyn Skalla, DO  lisinopril (ZESTRIL) 5 MG tablet Take 5 mg by mouth daily. May take 1 additional tablet once daily for SBP greater than 150 once hour after taking daily dose 03/12/19   [name], MD  metoprolol succinate (TOPROL-XL) 25 MG 24 hr tablet Take 0.5 tablets (12.5 mg total) by mouth daily. Take with or immediately following a meal. 01/23/19   [name], MD  Multiple Vitamins-Minerals (ONE-A-DAY MENS 50+ ADVANTAGE PO) Take 1 tablet by mouth daily.     [provider]  nitroGLYCERIN (NITROSTAT) 0.4 MG SL tablet PLACE 1 TABLET UNDER THE TONGUE EVERY 5 MINUTES AS NEEDED FOR CHEST PAIN. 3 DOSES MAX Patient taking differently: Place 0.4 mg under the tongue every 5 (five) minutes as needed for chest pain.  08/08/18   Strader, Fransisco Hertz, PA-C  ondansetron (ZOFRAN) 4 MG tablet Take 1 tablet (4 mg total) by mouth every 6 (six) hours. 07/17/19   Trinetta Alemu, DO  pantoprazole (PROTONIX) 40 MG tablet Take 40 mg by mouth daily.    [provider]  ranolazine (RANEXA) 1000 MG SR tablet TAKE ONE TABLET BY MOUTH TWICE DAILY Patient taking differently: Take 1,000 mg by mouth daily.  03/08/19   Liliane Shi, PA-C    Family History Family History  Problem Relation Age of Onset  . Lung cancer Mother   . Heart Problems Father   . Heart attack Father 73  . Stroke Father   . Heart failure Father   . Heart attack Maternal Grandmother   . Stroke Maternal Grandmother   . Heart attack Paternal Uncle   . Hypertension Brother   . Autoimmune disease Neg Hx     Social History Social History   Tobacco Use   . Smoking status: Former Smoker    Packs/day: 0.75    Years: 44.00    Pack years: 33.00    Types: Cigarettes    Quit date: 11/28/2015    Years since quitting: 3.6  . Smokeless tobacco: Never Used  Substance Use Topics  . Alcohol use: Yes    Frequency: Never  Comment: occ  . Drug use: No    Comment: 06/21/2017 "nothing since the 1980s"     Allergies   Prednisone, Tetanus toxoids, Wellbutrin [bupropion], Morphine and related, Scallops [shellfish allergy], and Chantix [varenicline]   Review of Systems Review of Systems  Constitutional: Negative for chills, fatigue and fever.  HENT: Negative for ear pain and sore throat.   Eyes: Negative for pain and visual disturbance.  Respiratory: Positive for cough. Negative for shortness of breath.   Cardiovascular: Negative for chest pain and palpitations.  Gastrointestinal: Positive for abdominal pain and nausea. Negative for anorexia, constipation and vomiting.  Genitourinary: Negative for dysuria and hematuria.  Musculoskeletal: Negative for arthralgias and back pain.  Skin: Negative for color change and rash.  Neurological: Negative for seizures and syncope.  All other systems reviewed and are negative.    Physical Exam Updated Vital Signs BP 137/84   Pulse 78   Temp 98.3 F (36.8 C) (Oral)   Resp 19   Ht 5\' 8"  (1.727 m)   Wt 88.5 kg   SpO2 96%   BMI 29.65 kg/m   Physical Exam Vitals signs and nursing note reviewed.  Constitutional:      General: He is not in acute distress.    Appearance: He is well-developed. He is not ill-appearing.  HENT:     Head: Normocephalic and atraumatic.  Eyes:     Extraocular Movements: Extraocular movements intact.     Conjunctiva/sclera: Conjunctivae normal.  Neck:     Musculoskeletal: Neck supple.  Cardiovascular:     Rate and Rhythm: Normal rate and regular rhythm.     Heart sounds: Normal heart sounds. No murmur.  Pulmonary:     Effort: Pulmonary effort is normal. No  respiratory distress.     Breath sounds: Normal breath sounds.  Abdominal:     General: Abdomen is flat.     Palpations: Abdomen is soft.     Tenderness: There is abdominal tenderness in the epigastric area. There is no right CVA tenderness, left CVA tenderness, guarding or rebound. Negative signs include Murphy's sign and Rovsing's sign.  Skin:    General: Skin is warm and dry.     Capillary Refill: Capillary refill takes less than 2 seconds.  Neurological:     General: No focal deficit present.     Mental Status: He is alert.  Psychiatric:        Mood and Affect: Mood normal.      ED Treatments / Results  Labs (all labs ordered are listed, but only abnormal results are displayed) Labs Reviewed  COMPREHENSIVE METABOLIC PANEL - Abnormal; Notable for the following components:      Result Value   Glucose, Bld 106 (*)    All other components within normal limits  SARS CORONAVIRUS 2 (TAT 6-24 HRS)  CBC WITH DIFFERENTIAL/PLATELET  LIPASE, BLOOD  URINALYSIS, ROUTINE W REFLEX MICROSCOPIC    EKG EKG Interpretation  Date/Time:  Tuesday July 17 2019 07:37:27 EST Ventricular Rate:  82 PR Interval:    QRS Duration: 93 QT Interval:  373 QTC Calculation: 436 R Axis:   62 Text Interpretation: Sinus rhythm Low voltage, precordial leads Confirmed by Lennice Sites [phone]) on 07/17/2019 7:46:16 AM   Radiology Ct Abdomen Pelvis W Contrast  Result Date: 07/17/2019 CLINICAL DATA:  Abdominal pain and distension. EXAM: CT ABDOMEN AND PELVIS WITH CONTRAST TECHNIQUE: Multidetector CT imaging of the abdomen and pelvis was performed using the standard protocol following bolus administration of intravenous contrast. CONTRAST:  161mL OMNIPAQUE IOHEXOL 300 MG/ML  SOLN COMPARISON:  CT abdomen pelvis 04/11/2019 FINDINGS: Lower chest: Lung bases clear bilaterally.  Heart size normal. Hepatobiliary: Tiny liver cyst. No liver mass. Liver density normal. Normal gallbladder and bile ducts. Pancreas:  Negative Spleen: Negative Adrenals/Urinary Tract: Adrenal glands are unremarkable. Kidneys are normal, without renal calculi, focal lesion, or hydronephrosis. Bladder is unremarkable. 15 mm left upper pole cyst is unchanged. Stomach/Bowel: Negative for bowel obstruction. No bowel mass or edema. Normal appendix. Mild sigmoid diverticulosis without diverticulitis. Vascular/Lymphatic: Moderate atherosclerotic disease in the aorta and iliac arteries. No aneurysm. The visceral arteries show patency without significant stenosis Reproductive: Mild prostate enlargement. Other: Negative for free fluid. Musculoskeletal: Mild lumbar degenerative change. No acute skeletal abnormality. IMPRESSION: No acute abnormality Normal appendix Mild sigmoid diverticulosis Atherosclerotic disease without aneurysm. Electronically Signed   By: Franchot Gallo M.D.   On: 07/17/2019 09:58   Dg Chest Portable 1 View  Result Date: 07/17/2019 CLINICAL DATA:  Cough EXAM: PORTABLE CHEST 1 VIEW COMPARISON:  April 11, 2019 FINDINGS: Lungs are clear. Heart size and pulmonary vascularity are normal. No adenopathy. Patient is status post coronary artery bypass grafting. There is aortic atherosclerosis. Postoperative changes noted in the lower cervical region. IMPRESSION: Postoperative changes. No edema or consolidation. Heart size normal. Aortic Atherosclerosis (ICD10-I70.0). Electronically Signed   By: Lowella Grip III M.D.   On: 07/17/2019 07:36    Procedures Procedures (including critical care time)  Medications Ordered in ED Medications  fentaNYL (SUBLIMAZE) injection 50 mcg (50 mcg Intravenous Given 07/17/19 0737)  ondansetron (ZOFRAN) injection 4 mg (4 mg Intravenous Given 07/17/19 0737)  sodium chloride 0.9 % bolus 1,000 mL (0 mLs Intravenous Stopped 07/17/19 0847)  alum & mag hydroxide-simeth (MAALOX/MYLANTA) 200-200-20 MG/5ML suspension 30 mL (30 mLs Oral Given 07/17/19 0739)    And  lidocaine (XYLOCAINE) 2 % viscous mouth  solution 15 mL (15 mLs Oral Given 07/17/19 0739)  fentaNYL (SUBLIMAZE) injection 50 mcg (50 mcg Intravenous Given 07/17/19 0845)  iohexol (OMNIPAQUE) 300 MG/ML solution 100 mL (100 mLs Intravenous Contrast Given 07/17/19 0930)     Initial Impression / Assessment and Plan / ED Course  I have reviewed the triage vital signs and the nursing notes.  Pertinent labs & imaging results that were available during my care of the patient were reviewed by me and considered in my medical decision making (see chart for details).     [name] is a 63 year old male history of CAD, reflux who presents to the ED with abdominal pain.  Patient with unremarkable vitals.  No fever.  Symptoms on and off for a while.  Worse over the last 2 days.  Patient is supposed to have a EGD later this week.  Needs coronavirus test for that.  States that he has had a bad headache, nausea, vomiting, abdominal pain for the last several days.  Has been taking over-the-counter medications including with some relief.  Felt little worse this morning.  Denies any chest pain.  Denies any constant shortness of breath.  Has had a cough.  Denies any exposure to coronavirus.  Denies any fever, urinary symptoms.  Pain mostly in the epigastric region on exam.  Likely gastritis versus reflux versus hepatobiliary process versus pancreatitis.  Will get lab work.  Will give IV fluids, pain medicine, Zofran.  No significant anemia, electrolyte abnormality, kidney injury.  Lipase normal doubt pancreatitis.  Gallbladder and liver enzymes within normal limits.  CT scan abdomen pelvis showed no signs of bowel  obstruction, no appendicitis.  Overall normal CT scan.  No signs of perforation.  Overall given reassurance.  Will prescribe narcotic pain medicine.  Will prescribe Zofran.  Patient has EGD scheduled for Friday. Suspect gastritis/esophagitis/ulcer. Covid test has been ordered for clearance for EGD.  Given return precautions and discharged in the ED  in good condition.  This chart was dictated using voice recognition software.  Despite best efforts to proofread,  errors can occur which can change the documentation meaning.    Final Clinical Impressions(s) / ED Diagnoses   Final diagnoses:  Generalized abdominal pain    ED Discharge Orders         Ordered    HYDROcodone-acetaminophen (NORCO) 5-325 MG tablet  Every 6 hours PRN     07/17/19 1007    ondansetron (ZOFRAN) 4 MG tablet  Every 6 hours     07/17/19 1007           Carmelle Bamberg, DO 07/17/19 1008

## 2019-07-19 ENCOUNTER — Encounter (HOSPITAL_COMMUNITY): Payer: Self-pay | Admitting: Emergency Medicine

## 2019-07-19 ENCOUNTER — Other Ambulatory Visit: Payer: Self-pay

## 2019-07-19 NOTE — Progress Notes (Signed)
Pre-op endo call completed  

## 2019-07-20 ENCOUNTER — Ambulatory Visit (HOSPITAL_COMMUNITY): Payer: BC Managed Care – PPO | Admitting: Anesthesiology

## 2019-07-20 ENCOUNTER — Encounter (HOSPITAL_COMMUNITY): Payer: Self-pay | Admitting: Anesthesiology

## 2019-07-20 ENCOUNTER — Other Ambulatory Visit: Payer: Self-pay

## 2019-07-20 ENCOUNTER — Encounter (HOSPITAL_COMMUNITY)
Admission: RE | Disposition: A | Discharge status: Home / Self Care | Payer: Self-pay | Source: Home / Self Care | Attending: Gastroenterology

## 2019-07-20 ENCOUNTER — Ambulatory Visit (HOSPITAL_COMMUNITY)
Admission: RE | Admit: 2019-07-20 | Discharge: 2019-07-20 | Disposition: A | Discharge status: Home / Self Care | Payer: BC Managed Care – PPO | Attending: Gastroenterology | Admitting: Gastroenterology

## 2019-07-20 DIAGNOSIS — Z87891 Personal history of nicotine dependence: Secondary | ICD-10-CM | POA: Insufficient documentation

## 2019-07-20 DIAGNOSIS — K2100 Gastro-esophageal reflux disease with esophagitis, without bleeding: Secondary | ICD-10-CM | POA: Insufficient documentation

## 2019-07-20 DIAGNOSIS — I10 Essential (primary) hypertension: Secondary | ICD-10-CM | POA: Diagnosis not present

## 2019-07-20 DIAGNOSIS — R0789 Other chest pain: Secondary | ICD-10-CM | POA: Insufficient documentation

## 2019-07-20 DIAGNOSIS — Z85828 Personal history of other malignant neoplasm of skin: Secondary | ICD-10-CM | POA: Insufficient documentation

## 2019-07-20 DIAGNOSIS — I2510 Atherosclerotic heart disease of native coronary artery without angina pectoris: Secondary | ICD-10-CM | POA: Diagnosis not present

## 2019-07-20 DIAGNOSIS — Z951 Presence of aortocoronary bypass graft: Secondary | ICD-10-CM | POA: Insufficient documentation

## 2019-07-20 DIAGNOSIS — Z8673 Personal history of transient ischemic attack (TIA), and cerebral infarction without residual deficits: Secondary | ICD-10-CM | POA: Diagnosis not present

## 2019-07-20 DIAGNOSIS — Z955 Presence of coronary angioplasty implant and graft: Secondary | ICD-10-CM | POA: Insufficient documentation

## 2019-07-20 DIAGNOSIS — I252 Old myocardial infarction: Secondary | ICD-10-CM | POA: Insufficient documentation

## 2019-07-20 HISTORY — PX: ESOPHAGOGASTRODUODENOSCOPY (EGD) WITH PROPOFOL: SHX5813

## 2019-07-20 HISTORY — PX: BIOPSY: SHX5522

## 2019-07-20 SURGERY — ESOPHAGOGASTRODUODENOSCOPY (EGD) WITH PROPOFOL
Anesthesia: Monitor Anesthesia Care

## 2019-07-20 MED ORDER — PROPOFOL 500 MG/50ML IV EMUL
INTRAVENOUS | Status: AC
  Filled 2019-07-20: qty 50

## 2019-07-20 MED ORDER — LACTATED RINGERS IV SOLN
INTRAVENOUS | Status: DC
  Administered 2019-07-20: 08:00:00 via INTRAVENOUS

## 2019-07-20 MED ORDER — PROPOFOL 500 MG/50ML IV EMUL
INTRAVENOUS | Status: DC | PRN
  Administered 2019-07-20: 100 ug/kg/min via INTRAVENOUS

## 2019-07-20 MED ORDER — PROPOFOL 10 MG/ML IV BOLUS
INTRAVENOUS | Status: DC | PRN
  Administered 2019-07-20 (×4): 20 mg via INTRAVENOUS

## 2019-07-20 MED ORDER — LIDOCAINE 2% (20 MG/ML) 5 ML SYRINGE
INTRAMUSCULAR | Status: DC | PRN
  Administered 2019-07-20: 100 mg via INTRAVENOUS

## 2019-07-20 MED ORDER — SODIUM CHLORIDE 0.9 % IV SOLN: INTRAVENOUS | Status: DC

## 2019-07-20 SURGICAL SUPPLY — 15 items

## 2019-07-20 NOTE — Anesthesia Procedure Notes (Signed)
Procedure Name: MAC Date/Time: 07/20/2019 8:32 AM Performed by: Deliah Boston, CRNA Pre-anesthesia Checklist: Patient identified, Emergency Drugs available, Suction available and Patient being monitored Patient Re-evaluated:Patient Re-evaluated prior to induction Oxygen Delivery Method: Simple face mask Preoxygenation: Pre-oxygenation with 100% oxygen Induction Type: IV induction Placement Confirmation: positive ETCO2 and breath sounds checked- equal and bilateral

## 2019-07-20 NOTE — Transfer of Care (Signed)
Immediate Anesthesia Transfer of Care Note  Patient: [name]  Procedure(s) Performed: Procedure(s): ESOPHAGOGASTRODUODENOSCOPY (EGD) WITH PROPOFOL (N/A) BIOPSY  Patient Location: PACU  Anesthesia Type:MAC  Level of Consciousness: Patient easily awoken, sedated, comfortable, cooperative, following commands, responds to stimulation.   Airway & Oxygen Therapy: Patient spontaneously breathing, ventilating well, oxygen via simple oxygen mask.  Post-op Assessment: Report given to PACU RN, vital signs reviewed and stable, moving all extremities.   Post vital signs: Reviewed and stable.  Complications: No apparent anesthesia complications Last Vitals:  Vitals Value Taken Time  BP 112/74 07/20/19 0851  Temp    Pulse 76 07/20/19 0851  Resp 20 07/20/19 0851  SpO2 99 % 07/20/19 0851  Vitals shown include unvalidated device data.  Last Pain:  Vitals:   07/20/19 0851  TempSrc:   PainSc: 0-No pain         Complications: No apparent anesthesia complications

## 2019-07-20 NOTE — Anesthesia Postprocedure Evaluation (Signed)
Anesthesia Post Note  Patient: [name]  Procedure(s) Performed: ESOPHAGOGASTRODUODENOSCOPY (EGD) WITH PROPOFOL (N/A ) BIOPSY     Patient location during evaluation: PACU Anesthesia Type: MAC Level of consciousness: awake and alert Pain management: pain level controlled Vital Signs Assessment: post-procedure vital signs reviewed and stable Respiratory status: spontaneous breathing, nonlabored ventilation, respiratory function stable and patient connected to nasal cannula oxygen Cardiovascular status: stable and blood pressure returned to baseline Postop Assessment: no apparent nausea or vomiting Anesthetic complications: no    Last Vitals:  Vitals:   07/20/19 0910 07/20/19 0920  BP: 126/82 116/86  Pulse: 79 80  Resp: 16 (!) 21  Temp:    SpO2: 97% 96%    Last Pain:  Vitals:   07/20/19 0920  TempSrc:   PainSc: 0-No pain                 Effie Berkshire

## 2019-07-20 NOTE — Anesthesia Preprocedure Evaluation (Addendum)
Anesthesia Evaluation  Patient identified by MRN, date of birth, ID band Patient awake    Reviewed: Allergy & Precautions, NPO status , Patient's Chart, lab work & pertinent test results  Airway Mallampati: I  TM Distance: >3 FB Neck ROM: Full    Dental  (+) Upper Dentures, Lower Dentures   Pulmonary former smoker,    breath sounds clear to auscultation       Cardiovascular hypertension, Pt. on medications and Pt. on home beta blockers + CAD, + Cardiac Stents and + CABG   Rhythm:Regular Rate:Normal     Neuro/Psych  Headaches, PSYCHIATRIC DISORDERS Anxiety Depression Bipolar Disorder CVA    GI/Hepatic GERD  Medicated,(+) Hepatitis -, C  Endo/Other  negative endocrine ROS  Renal/GU negative Renal ROS     Musculoskeletal   Abdominal Normal abdominal exam  (+)   Peds  Hematology negative hematology ROS (+)   Anesthesia Other Findings   Reproductive/Obstetrics                            Anesthesia Physical Anesthesia Plan  ASA: III  Anesthesia Plan: MAC   Post-op Pain Management:    Induction: Intravenous  PONV Risk Score and Plan: 0 and Propofol infusion  Airway Management Planned: Natural Airway and Simple Face Mask  Additional Equipment: None  Intra-op Plan:   Post-operative Plan:   Informed Consent: I have reviewed the patients History and Physical, chart, labs and discussed the procedure including the risks, benefits and alternatives for the proposed anesthesia with the patient or authorized representative who has indicated his/her understanding and acceptance.       Plan Discussed with: CRNA  Anesthesia Plan Comments:         Anesthesia Quick Evaluation

## 2019-07-20 NOTE — Discharge Instructions (Signed)

## 2019-07-20 NOTE — H&P (Signed)
[name] HPI: The patient was originally evaluated by Dr. Earlean Shawl for his atypical chest pain on 04/20/2019.  He was not responsive to pantoprazole 40 mg QD.  The addition of famotidine was not beneficial either.  A cardiac work up was performed and it was negative for any abnormalities.  On a routine basis he has nocturnal waking in the early morning hours with GERD and the chest pain.  Over the past three weeks he states that his pain resolved, but it recurred recently.  Movement does not worsen the pain.  He has a history of EoE and his last EGD one year ago did not show any residual EoE.  Past Medical History:  Diagnosis Date  . Anxiety   . Arthritis   . Basal cell carcinoma (BCC) of forehead   . CAD (coronary artery disease)    a. 10/2015 ant STEMI >> LHC with 3 v CAD; oLAD tx with POBA >> emergent CABG. b. Multiple evals since that time, early graft failure of SVG-RCA by cath 03/2016. c. 2/19 PCI/DES x1 to pRCA, normal EF.  . Carotid artery disease ([place])    a. 40-59% BICA 02/2018.  Marland Kitchen Depression   . Dyspnea   . Ectopic atrial tachycardia ([place])   . Esophageal reflux    eosinophil esophagitis  . Family history of adverse reaction to anesthesia    "sister has PONV" (06/21/2017)  . Former tobacco use   . Gout   . Hepatitis C    "treated and cured" (06/21/2017)  . High cholesterol   . History of kidney stones   . Hypertension   . Ischemic cardiomyopathy    a. EF 25-30% at intraop TEE 4/17  //  b. Limited Echo 5/17 - EF 45-50%, mild ant HK. c. EF 55-65% by cath 09/2017.  . Migraine    "3-4/yr" (06/21/2017)  . Myocardial infarction ([place]) 10/2015  . Palpitations   . Sinus bradycardia    a. HR dropping into 40s in 02/2016 -> BB reduced.  . Stroke ([place]) 10/2016   "small one; sometimes my memory/cognitive issues" (06/21/2017)  . Symptomatic hypotension    a. 02/2016 ER visit -> meds reduced.  . Syncope     Past Surgical History:  Procedure Laterality Date  . ANTERIOR CERVICAL  DECOMP/DISCECTOMY FUSION N/A 10/17/2018   Procedure: Anterior Cervical Decompression Fusion - Cervical seven -Thoracic one;  Surgeon: Consuella Lose, MD;  Location: [facility];  Service: Neurosurgery;  Laterality: N/A;  . BASAL CELL CARCINOMA EXCISION     "forehead  . CARDIAC CATHETERIZATION N/A 11/28/2015   Procedure: Left Heart Cath and Coronary Angiography;  Surgeon: [name], MD;  Location: [facility];  Service: Cardiovascular;  Laterality: N/A;  . CARDIAC CATHETERIZATION N/A 11/28/2015   Procedure: Coronary Balloon Angioplasty;  Surgeon: [name], MD;  Location: [facility];  Service: Cardiovascular;  Laterality: N/A;  ostial LAD  . CARDIAC CATHETERIZATION N/A 11/28/2015   Procedure: Coronary/Graft Angiography;  Surgeon: [name], MD;  Location: [facility];  Service: Cardiovascular;  Laterality: N/A;  coronaries only   . CARDIAC CATHETERIZATION N/A 04/21/2016   Procedure: Left Heart Cath and Coronary Angiography;  Surgeon: Wellington Hampshire, MD;  Location: [facility];  Service: Cardiovascular;  Laterality: N/A;  . CARDIAC CATHETERIZATION N/A 06/14/2016   Procedure: Left Heart Cath and Cors/Grafts Angiography;  Surgeon: Lorretta Harp, MD;  Location: [facility];  Service: Cardiovascular;  Laterality: N/A;  .  CARDIAC CATHETERIZATION N/A 09/08/2016   Procedure: Left Heart Cath and Cors/Grafts Angiography;  Surgeon: Wellington Hampshire, MD;  Location: [facility];  Service: Cardiovascular;  Laterality: N/A;  . CARDIAC CATHETERIZATION    . CORONARY ARTERY BYPASS GRAFT N/A 11/28/2015   Procedure: CORONARY ARTERY BYPASS GRAFTING (CABG) TIMES FIVE USING LEFT INTERNAL MAMMARY ARTERY AND RIGHT GREATER SAPHENOUS,VIEN HARVEATED BY ENDOVIEN, INTRAOPPRATIVE TEE;  Surgeon: Gaye Pollack, MD;  Location: [facility];  Service: Open Heart Surgery;  Laterality: N/A;  . CORONARY STENT INTERVENTION N/A 10/05/2017   Procedure: CORONARY STENT  INTERVENTION;  Surgeon: [name], MD;  Location: [facility];  Service: Cardiovascular;  Laterality: N/A;  . HUMERUS SURGERY Right 1969   "tumor inside bone; filled it w/bone chips"  . LEFT HEART CATH AND CORS/GRAFTS ANGIOGRAPHY N/A 03/11/2017   Procedure: Left Heart Cath and Cors/Grafts Angiography;  Surgeon: Leonie Man, MD;  Location: [facility];  Service: Cardiovascular;  Laterality: N/A;  . LEFT HEART CATH AND CORS/GRAFTS ANGIOGRAPHY N/A 10/05/2017   Procedure: LEFT HEART CATH AND CORS/GRAFTS ANGIOGRAPHY;  Surgeon: [name], MD;  Location: [facility];  Service: Cardiovascular;  Laterality: N/A;  . LEFT HEART CATH AND CORS/GRAFTS ANGIOGRAPHY N/A 04/11/2019   Procedure: LEFT HEART CATH AND CORS/GRAFTS ANGIOGRAPHY;  Surgeon: [name], MD;  Location: [facility];  Service: Cardiovascular;  Laterality: N/A;  . PERIPHERAL VASCULAR CATHETERIZATION N/A 06/14/2016   Procedure: Lower Extremity Angiography;  Surgeon: Lorretta Harp, MD;  Location: [facility];  Service: Cardiovascular;  Laterality: N/A;    Family History  Problem Relation Age of Onset  . Lung cancer Mother   . Heart Problems Father   . Heart attack Father 45  . Stroke Father   . Heart failure Father   . Heart attack Maternal Grandmother   . Stroke Maternal Grandmother   . Heart attack Paternal Uncle   . Hypertension Brother   . Autoimmune disease Neg Hx     Social History:  reports that he quit smoking about 3 years ago. His smoking use included cigarettes. He has a 33.00 pack-year smoking history. He has never used smokeless tobacco. He reports current alcohol use. He reports that he does not use drugs.  Allergies:  Allergies  Allergen Reactions  . Prednisone Other (See Comments)    States that this med makes him "crazy"  . Tetanus Toxoids Swelling and Other (See Comments)    Fever, Swelling of the arm   . Wellbutrin [Bupropion] Other (See  Comments)    Crazy thoughts, nightmares  . Morphine And Related Hives and Itching    Redness at the injection site  . Scallops [Shellfish Allergy] Nausea Only  . Chantix [Varenicline] Other (See Comments)    Dreams    Medications:  Scheduled:  Continuous: . lactated ringers 20 mL/hr at 07/20/19 0815    No results found for this or any previous visit (from the past 24 hour(s)).   No results found.  ROS:  As stated above in the HPI otherwise negative.  Blood pressure 120/78, pulse 93, temperature 98.4 F (36.9 C), temperature source Oral, resp. rate 19, height 5\' 8"  (1.727 m), weight 88.5 kg, SpO2 97 %.    PE: Gen: NAD, Alert and Oriented HEENT:  [place]/AT, EOMI Neck: Supple, no LAD Lungs: CTA Bilaterally CV: RRR without M/G/R ABM: Soft, NTND, +BS Ext: No C/C/E  Assessment/Plan: 1) Atypical chest pain. 2) History of  EoE.   A repeat EGD with biopsies of the esophagus and the stomach will be obtained.  He will follow up with Dr. Earlean Shawl.  Kenishia Plack D 07/20/2019, 8:17 AM

## 2019-07-20 NOTE — Op Note (Signed)
[facility] Patient Name: [name] Procedure Date: 07/20/2019 MRN: [id number] Attending MD: Carol Ada , MD Date of Birth: 1956-06-11 CSN: [id number] Age: 63 Admit Type: Outpatient Procedure:                Upper GI endoscopy Indications:              Chest pain (non cardiac) Providers:                Carol Ada, MD, Cleda Daub, RN, Cherylynn Ridges,                            Technician, Janeece Agee, Technician, Heide Scales,                            CRNA Referring MD:              Medicines:                 Complications:            No immediate complications. Estimated Blood Loss:     Estimated blood loss was minimal. Procedure:                Pre-Anesthesia Assessment:                           - Prior to the procedure, a History and Physical                            was performed, and patient medications and                            allergies were reviewed. The patient's tolerance of                            previous anesthesia was also reviewed. The risks                            and benefits of the procedure and the sedation                            options and risks were discussed with the patient.                            All questions were answered, and informed consent                            was obtained. Prior Anticoagulants: The patient has                            taken Plavix (clopidogrel), last dose was 1 day                            prior to procedure. ASA Grade Assessment: III - A  patient with severe systemic disease. After                            reviewing the risks and benefits, the patient was                            deemed in satisfactory condition to undergo the                            procedure.                           - Sedation was administered by an anesthesia                            professional. Deep sedation was attained.                           After obtaining  informed consent, the endoscope was                            passed under direct vision. Throughout the                            procedure, the patient's blood pressure, pulse, and                            oxygen saturations were monitored continuously. The                            GIF-H190 ([id number] Olympus gastroscope was                            introduced through the mouth, and advanced to the                            second part of duodenum. The upper GI endoscopy was                            accomplished without difficulty. The patient                            tolerated the procedure well. Scope In: Scope Out: Findings:      LA Grade A (one or more mucosal breaks less than 5 mm, not extending       between tops of 2 mucosal folds) esophagitis with no bleeding was found       at the gastroesophageal junction. Biopsies were taken with a cold       forceps for histology.      The entire examined stomach was normal. Biopsies were taken with a cold       forceps for histology.      The examined duodenum was normal.      There was scant evidence of esophagitis in the distal esophagus/GE       junction. A mucosal break was identified. It is  unclear if this is the       source of the patient's complaints. Impression:               - LA Grade A esophagitis with no bleeding. Biopsied.                           - Normal stomach. Biopsied.                           - Normal examined duodenum. Moderate Sedation:      Not Applicable - Patient had care per Anesthesia. Recommendation:           - Patient has a contact number available for                            emergencies. The signs and symptoms of potential                            delayed complications were discussed with the                            patient. Return to normal activities tomorrow.                            Written discharge instructions were provided to the                            patient.                            - Resume previous diet.                           - Continue present medications.                           - Await pathology results.                           - Follow up with Dr. Earlean Shawl. Procedure Code(s):        --- Professional ---                           [phone], Esophagogastroduodenoscopy, flexible,                            transoral; with biopsy, single or multiple Diagnosis Code(s):        --- Professional ---                           K20.90, Esophagitis, unspecified without bleeding                           R07.89, Other chest pain CPT copyright 2019 [employer]. All rights reserved. The codes documented in this report are preliminary and upon coder review may  be revised to meet current compliance requirements. Carol Ada, MD Saralyn Pilar  Benson Norway, MD 07/20/2019 8:51:24 AM This report has been signed electronically. Number of Addenda: 0

## 2019-07-23 ENCOUNTER — Encounter (HOSPITAL_COMMUNITY): Payer: Self-pay | Admitting: Gastroenterology

## 2019-07-23 LAB — SURGICAL PATHOLOGY

## 2019-08-03 IMAGING — DX DG CHEST 1V PORT
1 series · 1 of 1 positions shown · non-contrast
Comparison: 06/21/2017

CLINICAL DATA: Chest pain

EXAM:
PORTABLE CHEST 1 VIEW

[chest ap]
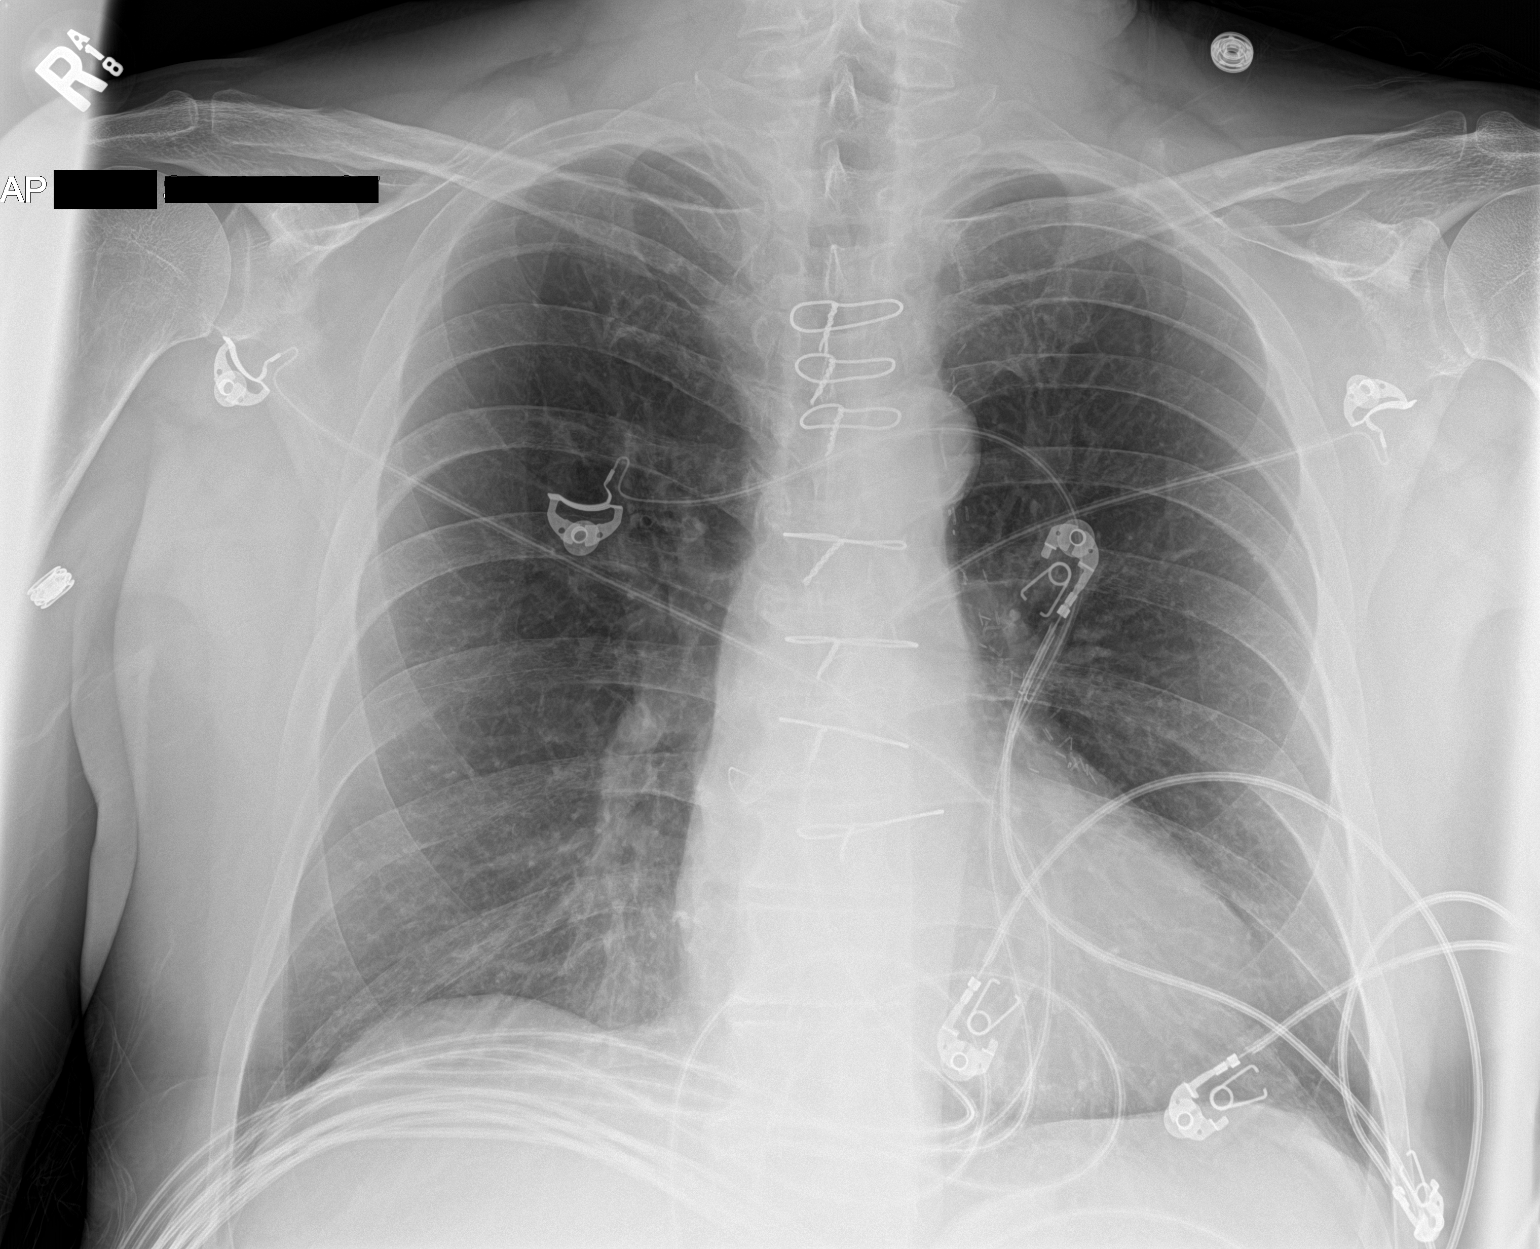

[1 of 1 positions shown; findings below may reference images not displayed]

FINDINGS: Prior CABG. Heart and mediastinal contours are within normal limits.
No focal opacities or effusions. No acute bony abnormality.
IMPRESSION: No active cardiopulmonary disease.

## 2019-08-04 NOTE — Progress Notes (Signed)
Virtual Visit via Video Note   This visit type was conducted due to national recommendations for restrictions regarding the COVID-19 Pandemic (e.g. social distancing) in an effort to limit this patient's exposure and mitigate transmission in our community.  Due to his co-morbid illnesses, this patient is at least at moderate risk for complications without adequate follow up.  This format is felt to be most appropriate for this patient at this time.  All issues noted in this document were discussed and addressed.  A limited physical exam was performed with this format.  Please refer to the patient's chart for his consent to telehealth for [facility] [place].  Patient at home.     Cardiology Office Note   Date:  08/06/2019   ID:  [name], DOB Jan 08, 1956, MRN [id number]  PCP:  [name], MD    No chief complaint on file.  CAD  Wt Readings from Last 3 Encounters:  08/06/19 195 lb (88.5 kg)  07/20/19 195 lb (88.5 kg)  07/17/19 195 lb (88.5 kg)       History of Present Illness: [name] is a 63 y.o. male   with CAD, s/p anterior STEMI and emergent CABG in 2017.   He has had multiple episodes of chest pain since then and multiple caths in 2018 and a stress test not resulting in PCI.  He admits to having had some anxiety as well.  Medications have been difficult to tolerate for his anxiety at times.   In 2/19, he had a cath and PCI of the RCA due to narrowing of the graft to the distal RCA.   Seen 02/2018 for presyncope not felt to be cardiac in origin.   Patient was in the ER 06/09/2018 with palpitations and heart rate up to 120 but normal when he arrived.  Also had atypical chest pain anxiety was felt to be a component and he requested narcotics for relief. Event monitor 06/13/2018 normal sinus rhythm with occasional sinus bradycardia and sinus tachycardia.  No atrial fibrillation or pathologic arrhythmias.  Patient back in the ER 09/28/2018 for atypical chest pain  that was reproducible.  2D echo normal LVEF 55 to 60% with no valvular problems.  CTA no evidence of aortic dissection, mild prominence of the ascending aorta at 3.9 x 3.9 cm.  In 09/2018, Patient had C7-T1 anterior cervical fusion done by [name] with [facility] neurosurgery and spine.    Since the RCA stent, he felt much better.  He developed more chest pain in 03/2019 which led to a cath:  Mid LAD lesion is 55% stenosed.  Ost LAD to Prox LAD lesion is 50% stenosed. LIMA to LAD is patent.  Ost Ramus lesion is 90% stenosed.  Ramus lesion is 75% stenosed.  Ost Cx lesion is 80% stenosed.  Prox Cx to Mid Cx lesion is 50% stenosed. SVG to OM is patent.  Dist RCA lesion is 50% stenosed.  RPDA lesion is 40% stenosed. SVG to PDA is patent with proximal disease that is unchanged.  Previously placed Ost RCA to Prox RCA drug eluting stent is widely patent.  Graft to the diagonal was occluded. There was TIMI 3 flow in the small diagonal with proximal ectasia.  The left ventricular systolic function is normal.  LV end diastolic pressure is normal. LVEDP 11 mm Hg.  The left ventricular ejection fraction is 50-55% by visual estimate.  There is no aortic valve stenosis.   Continue medical therapy.    No new PCI was  done.   Since 03/2019, he has done well.  He is very careful about avoiding crowds and wearing a mask.  His work is from home or outdoors.    Neck is better after his surgery as well.   Denies : Chest pain. Dizziness. Leg edema. Nitroglycerin use. Orthopnea. Palpitations. Paroxysmal nocturnal dyspnea. Shortness of breath. Syncope.   Reports some insomnia.    Past Medical History:  Diagnosis Date  . Anxiety   . Arthritis   . Basal cell carcinoma (BCC) of forehead   . CAD (coronary artery disease)    a. 10/2015 ant STEMI >> LHC with 3 v CAD; oLAD tx with POBA >> emergent CABG. b. Multiple evals since that time, early graft failure of SVG-RCA by cath 03/2016. c. 2/19  PCI/DES x1 to pRCA, normal EF.  . Carotid artery disease ([place])    a. 40-59% BICA 02/2018.  Marland Kitchen Depression   . Dyspnea   . Ectopic atrial tachycardia ([place])   . Esophageal reflux    eosinophil esophagitis  . Family history of adverse reaction to anesthesia    "sister has PONV" (06/21/2017)  . Former tobacco use   . Gout   . Hepatitis C    "treated and cured" (06/21/2017)  . High cholesterol   . History of kidney stones   . Hypertension   . Ischemic cardiomyopathy    a. EF 25-30% at intraop TEE 4/17  //  b. Limited Echo 5/17 - EF 45-50%, mild ant HK. c. EF 55-65% by cath 09/2017.  . Migraine    "3-4/yr" (06/21/2017)  . Myocardial infarction ([place]) 10/2015  . Palpitations   . Sinus bradycardia    a. HR dropping into 40s in 02/2016 -> BB reduced.  . Stroke ([place]) 10/2016   "small one; sometimes my memory/cognitive issues" (06/21/2017)  . Symptomatic hypotension    a. 02/2016 ER visit -> meds reduced.  . Syncope     Past Surgical History:  Procedure Laterality Date  . ANTERIOR CERVICAL DECOMP/DISCECTOMY FUSION N/A 10/17/2018   Procedure: Anterior Cervical Decompression Fusion - Cervical seven -Thoracic one;  Surgeon: Consuella Lose, MD;  Location: [facility];  Service: Neurosurgery;  Laterality: N/A;  . BASAL CELL CARCINOMA EXCISION     "forehead  . BIOPSY  07/20/2019   Procedure: BIOPSY;  Surgeon: Carol Ada, MD;  Location: [facility];  Service: Endoscopy;;  . CARDIAC CATHETERIZATION N/A 11/28/2015   Procedure: Left Heart Cath and Coronary Angiography;  Surgeon: [name], MD;  Location: [place] [facility];  Service: Cardiovascular;  Laterality: N/A;  . CARDIAC CATHETERIZATION N/A 11/28/2015   Procedure: Coronary Balloon Angioplasty;  Surgeon: [name], MD;  Location: [place] [facility];  Service: Cardiovascular;  Laterality: N/A;  ostial LAD  . CARDIAC CATHETERIZATION N/A 11/28/2015   Procedure: Coronary/Graft Angiography;  Surgeon: [name], MD;   Location: [place] [facility];  Service: Cardiovascular;  Laterality: N/A;  coronaries only   . CARDIAC CATHETERIZATION N/A 04/21/2016   Procedure: Left Heart Cath and Coronary Angiography;  Surgeon: Wellington Hampshire, MD;  Location: [place] [facility];  Service: Cardiovascular;  Laterality: N/A;  . CARDIAC CATHETERIZATION N/A 06/14/2016   Procedure: Left Heart Cath and Cors/Grafts Angiography;  Surgeon: Lorretta Harp, MD;  Location: [place] [facility];  Service: Cardiovascular;  Laterality: N/A;  . CARDIAC CATHETERIZATION N/A 09/08/2016   Procedure: Left Heart Cath and Cors/Grafts Angiography;  Surgeon: Wellington Hampshire, MD;  Location: [place] [facility];  Service: Cardiovascular;  Laterality: N/A;  . CARDIAC CATHETERIZATION    . CORONARY ARTERY BYPASS GRAFT N/A 11/28/2015   Procedure: CORONARY ARTERY BYPASS GRAFTING (CABG) TIMES FIVE USING LEFT INTERNAL MAMMARY ARTERY AND RIGHT GREATER SAPHENOUS,VIEN HARVEATED BY ENDOVIEN, INTRAOPPRATIVE TEE;  Surgeon: Gaye Pollack, MD;  Location: [facility];  Service: Open Heart Surgery;  Laterality: N/A;  . CORONARY STENT INTERVENTION N/A 10/05/2017   Procedure: CORONARY STENT INTERVENTION;  Surgeon: [name], MD;  Location: [place] [facility];  Service: Cardiovascular;  Laterality: N/A;  . ESOPHAGOGASTRODUODENOSCOPY (EGD) WITH PROPOFOL N/A 07/20/2019   Procedure: ESOPHAGOGASTRODUODENOSCOPY (EGD) WITH PROPOFOL;  Surgeon: Carol Ada, MD;  [facility];  Service: Endoscopy;  Laterality: N/A;  . HUMERUS SURGERY Right 1969   "tumor inside bone; filled it w/bone chips"  . LEFT HEART CATH AND CORS/GRAFTS ANGIOGRAPHY N/A 03/11/2017   Procedure: Left Heart Cath and Cors/Grafts Angiography;  Surgeon: Leonie Man, MD;  Location: [place] [facility];  Service: Cardiovascular;  Laterality: N/A;  . LEFT HEART CATH AND CORS/GRAFTS ANGIOGRAPHY N/A 10/05/2017   Procedure: LEFT HEART CATH AND CORS/GRAFTS ANGIOGRAPHY;  Surgeon: [name], MD;   Location: [place] [facility];  Service: Cardiovascular;  Laterality: N/A;  . LEFT HEART CATH AND CORS/GRAFTS ANGIOGRAPHY N/A 04/11/2019   Procedure: LEFT HEART CATH AND CORS/GRAFTS ANGIOGRAPHY;  Surgeon: [name], MD;  Location: [place] [facility];  Service: Cardiovascular;  Laterality: N/A;  . PERIPHERAL VASCULAR CATHETERIZATION N/A 06/14/2016   Procedure: Lower Extremity Angiography;  Surgeon: Lorretta Harp, MD;  Location: [place] [facility];  Service: Cardiovascular;  Laterality: N/A;     Current Outpatient Medications  Medication Sig Dispense Refill  . acetaminophen (TYLENOL) 500 MG tablet Take 1,000 mg by mouth every 6 (six) hours as needed for mild pain or headache.     Marland Kitchen amLODipine (NORVASC) 10 MG tablet Take 1 tablet (10 mg total) by mouth daily. 90 tablet 1  . aspirin 81 MG tablet Take 1 tablet (81 mg total) by mouth daily. 30 tablet   . atorvastatin (LIPITOR) 80 MG tablet TAKE 1 TABLET(80 MG) BY MOUTH DAILY 30 tablet 5  . chlordiazePOXIDE (LIBRIUM) 10 MG capsule Take 10-20 mg by mouth 3 (three) times daily as needed for anxiety.     . clopidogrel (PLAVIX) 75 MG tablet Take 1 tablet (75 mg total) by mouth daily. 90 tablet 2  . Doxepin HCl 6 MG TABS Take 6 mg by mouth at bedtime as needed for sleep.    Marland Kitchen lisinopril (ZESTRIL) 5 MG tablet Take 5 mg by mouth daily. May take 1 additional tablet once daily for SBP greater than 150 once hour after taking daily dose 135 tablet 2  . metoprolol succinate (TOPROL-XL) 25 MG 24 hr tablet Take 0.5 tablets (12.5 mg total) by mouth daily. Take with or immediately following a meal. 45 tablet 3  . Multiple Vitamins-Minerals (ONE-A-DAY MENS 50+ ADVANTAGE PO) Take 1 tablet by mouth daily.     . nitroGLYCERIN (NITROSTAT) 0.4 MG SL tablet PLACE 1 TABLET UNDER THE TONGUE EVERY 5 MINUTES AS NEEDED FOR CHEST PAIN. 3 DOSES MAX 25 tablet 4  . pantoprazole (PROTONIX) 40 MG tablet Take 40 mg by mouth daily.    . ranolazine (RANEXA) 1000 MG SR tablet  TAKE ONE TABLET BY MOUTH TWICE DAILY (Patient taking differently: Take 1,000 mg by mouth daily. ) 180 tablet 0   No current facility-administered medications for this visit.  Allergies:   Prednisone, Tetanus toxoids, Wellbutrin [bupropion], Morphine and related, Scallops [shellfish allergy], and Chantix [varenicline]    Social History:  The patient  reports that he quit smoking about 3 years ago. His smoking use included cigarettes. He has a 33.00 pack-year smoking history. He has never used smokeless tobacco. He reports current alcohol use. He reports that he does not use drugs.   Family History:  The patient's family history includes Heart Problems in his father; Heart attack in his maternal grandmother and paternal uncle; Heart attack (age of onset: 40) in his father; Heart failure in his father; Hypertension in his brother; Lung cancer in his mother; Stroke in his father and maternal grandmother.    ROS:  Please see the history of present illness.   Otherwise, review of systems are positive for recent cold sx.   All other systems are reviewed and negative.    PHYSICAL EXAM: VS:  BP (!) 141/78   Pulse (!) 58   Ht 5\' 8"  (1.727 m)   Wt 195 lb (88.5 kg)   BMI 29.65 kg/m  , BMI Body mass index is 29.65 kg/m. GEN: Well nourished, well developed, in no acute distress  HEENT: normal  Neck: no JVD, carotid bruits, or masses Cardiac:no edema  Respiratory: normal work of breathing  Skin: no rash Neuro:  No focal deficits Psych: euthymic mood, full affect   Recent Labs: 07/17/2019: ALT 44; BUN 16; Creatinine, Ser 1.01; Hemoglobin 14.8; Platelets 224; Potassium 4.2; Sodium 137   Lipid Panel    Component Value Date/Time   CHOL 114 11/08/2017 0833   TRIG 93 11/08/2017 0833   HDL 52 11/08/2017 0833   CHOLHDL 2.2 11/08/2017 0833   CHOLHDL 2.2 06/22/2017 0256   VLDL 27 06/22/2017 0256   LDLCALC 43 11/08/2017 0833     Other studies Reviewed: Additional studies/ records that  were reviewed today with results demonstrating: Labs reviewed.   ASSESSMENT AND PLAN:  1.  CAD: No angina.  COntinue aggressive secondary prevention. 2. Hyperlipidemia: LFTs stable.  Continue atorvastatin.  Check lipids in 5/21. 3. 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   4. Anxiety: Stable at this time.  5. HTN: Check at home.  Send in readings via my chart.  BP higher today. Increase walking and decrease salt.  If readings don't decrease, will increase lisinopril.  Check BP 2-3x/week. 6. No fevers or cough; mild cold sx so he did a video visit instead.   Current medicines are reviewed at length with the patient today.  The patient concerns regarding his medicines were addressed.  The following changes have been made:  No change  Labs/ tests ordered today include:  No orders of the defined types were placed in this encounter.   Recommend 150 minutes/week of aerobic exercise Low fat, low carb, high fiber diet recommended  Disposition:   FU in 1 year   Signed, Larae Grooms, MD  08/06/2019 11:24 AM    [facility] [employer], [place],   [address] Phone: [phone]; Fax: [phone]

## 2019-08-06 ENCOUNTER — Telehealth: Payer: Self-pay | Admitting: Interventional Cardiology

## 2019-08-06 ENCOUNTER — Other Ambulatory Visit: Payer: Self-pay

## 2019-08-06 ENCOUNTER — Encounter: Payer: Self-pay | Admitting: Interventional Cardiology

## 2019-08-06 ENCOUNTER — Telehealth (INDEPENDENT_AMBULATORY_CARE_PROVIDER_SITE_OTHER): Payer: BC Managed Care – PPO | Admitting: Interventional Cardiology

## 2019-08-06 VITALS — BP 141/78 | HR 58 | Ht 68.0 in | Wt 195.0 lb

## 2019-08-06 DIAGNOSIS — E785 Hyperlipidemia, unspecified: Secondary | ICD-10-CM | POA: Diagnosis not present

## 2019-08-06 DIAGNOSIS — I252 Old myocardial infarction: Secondary | ICD-10-CM

## 2019-08-06 DIAGNOSIS — I25119 Atherosclerotic heart disease of native coronary artery with unspecified angina pectoris: Secondary | ICD-10-CM | POA: Diagnosis not present

## 2019-08-06 DIAGNOSIS — I10 Essential (primary) hypertension: Secondary | ICD-10-CM | POA: Diagnosis not present

## 2019-08-06 MED ORDER — NITROGLYCERIN 0.4 MG SL SUBL: SUBLINGUAL_TABLET | SUBLINGUAL | 4 refills | Status: DC

## 2019-08-06 NOTE — Telephone Encounter (Signed)
New Message    Pt is calling because he has an appt today and is having cold like symptoms and is wondering if he needs to reschedule    Please call

## 2019-08-06 NOTE — Patient Instructions (Signed)
Medication Instructions:  Your physician recommends that you continue on your current medications as directed. Please refer to the Current Medication list given to you today.  *If you need a refill on your cardiac medications before your next appointment, please call your pharmacy*  Lab Work: Your physician recommends that you return for a FASTING lipid profile and complete metabolic panel in May 3276  If you have labs (blood work) drawn today and your tests are completely normal, you will receive your results only by: Marland Kitchen MyChart Message (if you have MyChart) OR . A paper copy in the mail If you have any lab test that is abnormal or we need to change your treatment, we will call you to review the results.  Testing/Procedures: None ordered  Follow-Up: At [facility], you and your health needs are our priority.  As part of our continuing mission to provide you with exceptional heart care, we have created designated Provider Care Teams.  These Care Teams include your primary Cardiologist (physician) and Advanced Practice Providers (APPs -  Physician Assistants and Nurse Practitioners) who all work together to provide you with the care you need, when you need it.  Your next appointment:   12 month(s)  The format for your next appointment:   In Person  Provider:   You may see Larae Grooms, MD or one of the following Advanced Practice Providers on your designated Care Team:    Melina Copa, PA-C  Ermalinda Barrios, PA-C   Other Instructions

## 2019-08-06 NOTE — Telephone Encounter (Signed)
Called patient. Will switch visit to virtual.     Virtual Visit Pre-Appointment Phone Call  TELEPHONE CALL NOTE  Rodrigues Urbanek has been deemed a candidate for a follow-up tele-health visit to limit community exposure during the Covid-19 pandemic. I spoke with the patient via phone to ensure availability of phone/video source, confirm preferred email & phone number, and discuss instructions and expectations.  I reminded Ashly Goethe to be prepared with any vital sign and/or heart rhythm information that could potentially be obtained via home monitoring, at the time of his visit. I reminded Verland Sprinkle to expect a phone call prior to his visit.  Patient agrees to consent below.  Cleon Gustin, RN 08/06/2019 9:51 AM   FULL LENGTH CONSENT FOR TELE-HEALTH VISIT   I hereby voluntarily request, consent and authorize [facility] and its employed or contracted physicians, physician assistants, nurse practitioners or other licensed health care professionals (the Practitioner), to provide me with telemedicine health care services (the "Services") as deemed necessary by the treating Practitioner. I acknowledge and consent to receive the Services by the Practitioner via telemedicine. I understand that the telemedicine visit will involve communicating with the Practitioner through live audiovisual communication technology and the disclosure of certain medical information by electronic transmission. I acknowledge that I have been given the opportunity to request an in-person assessment or other available alternative prior to the telemedicine visit and am voluntarily participating in the telemedicine visit.  I understand that I have the right to withhold or withdraw my consent to the use of telemedicine in the course of my care at any time, without affecting my right to future care or treatment, and that the Practitioner or I may terminate the telemedicine visit at any time. I understand that I  have the right to inspect all information obtained and/or recorded in the course of the telemedicine visit and may receive copies of available information for a reasonable fee.  I understand that some of the potential risks of receiving the Services via telemedicine include:  Marland Kitchen Delay or interruption in medical evaluation due to technological equipment failure or disruption; . Information transmitted may not be sufficient (e.g. poor resolution of images) to allow for appropriate medical decision making by the Practitioner; and/or  . In rare instances, security protocols could fail, causing a breach of personal health information.  Furthermore, I acknowledge that it is my responsibility to provide information about my medical history, conditions and care that is complete and accurate to the best of my ability. I acknowledge that Practitioner's advice, recommendations, and/or decision may be based on factors not within their control, such as incomplete or inaccurate data provided by me or distortions of diagnostic images or specimens that may result from electronic transmissions. I understand that the practice of medicine is not an exact science and that Practitioner makes no warranties or guarantees regarding treatment outcomes. I acknowledge that I will receive a copy of this consent concurrently upon execution via email to the email address I last provided but may also request a printed copy by calling the office of [place].    I understand that my insurance will be billed for this visit.   I have read or had this consent read to me. . I understand the contents of this consent, which adequately explains the benefits and risks of the Services being provided via telemedicine.  . I have been provided ample opportunity to ask questions regarding this consent and the Services and have had my questions  answered to my satisfaction. . I give my informed consent for the services to be provided through the  use of telemedicine in my medical care  By participating in this telemedicine visit I agree to the above.

## 2019-11-02 IMAGING — CR DG CHEST 2V
2 series · 2 of 2 positions shown · non-contrast
Comparison: In chest radiograph 10/04/2017

CLINICAL DATA: Chest pain

EXAM:
CHEST - 2 VIEW

[w chest pa]
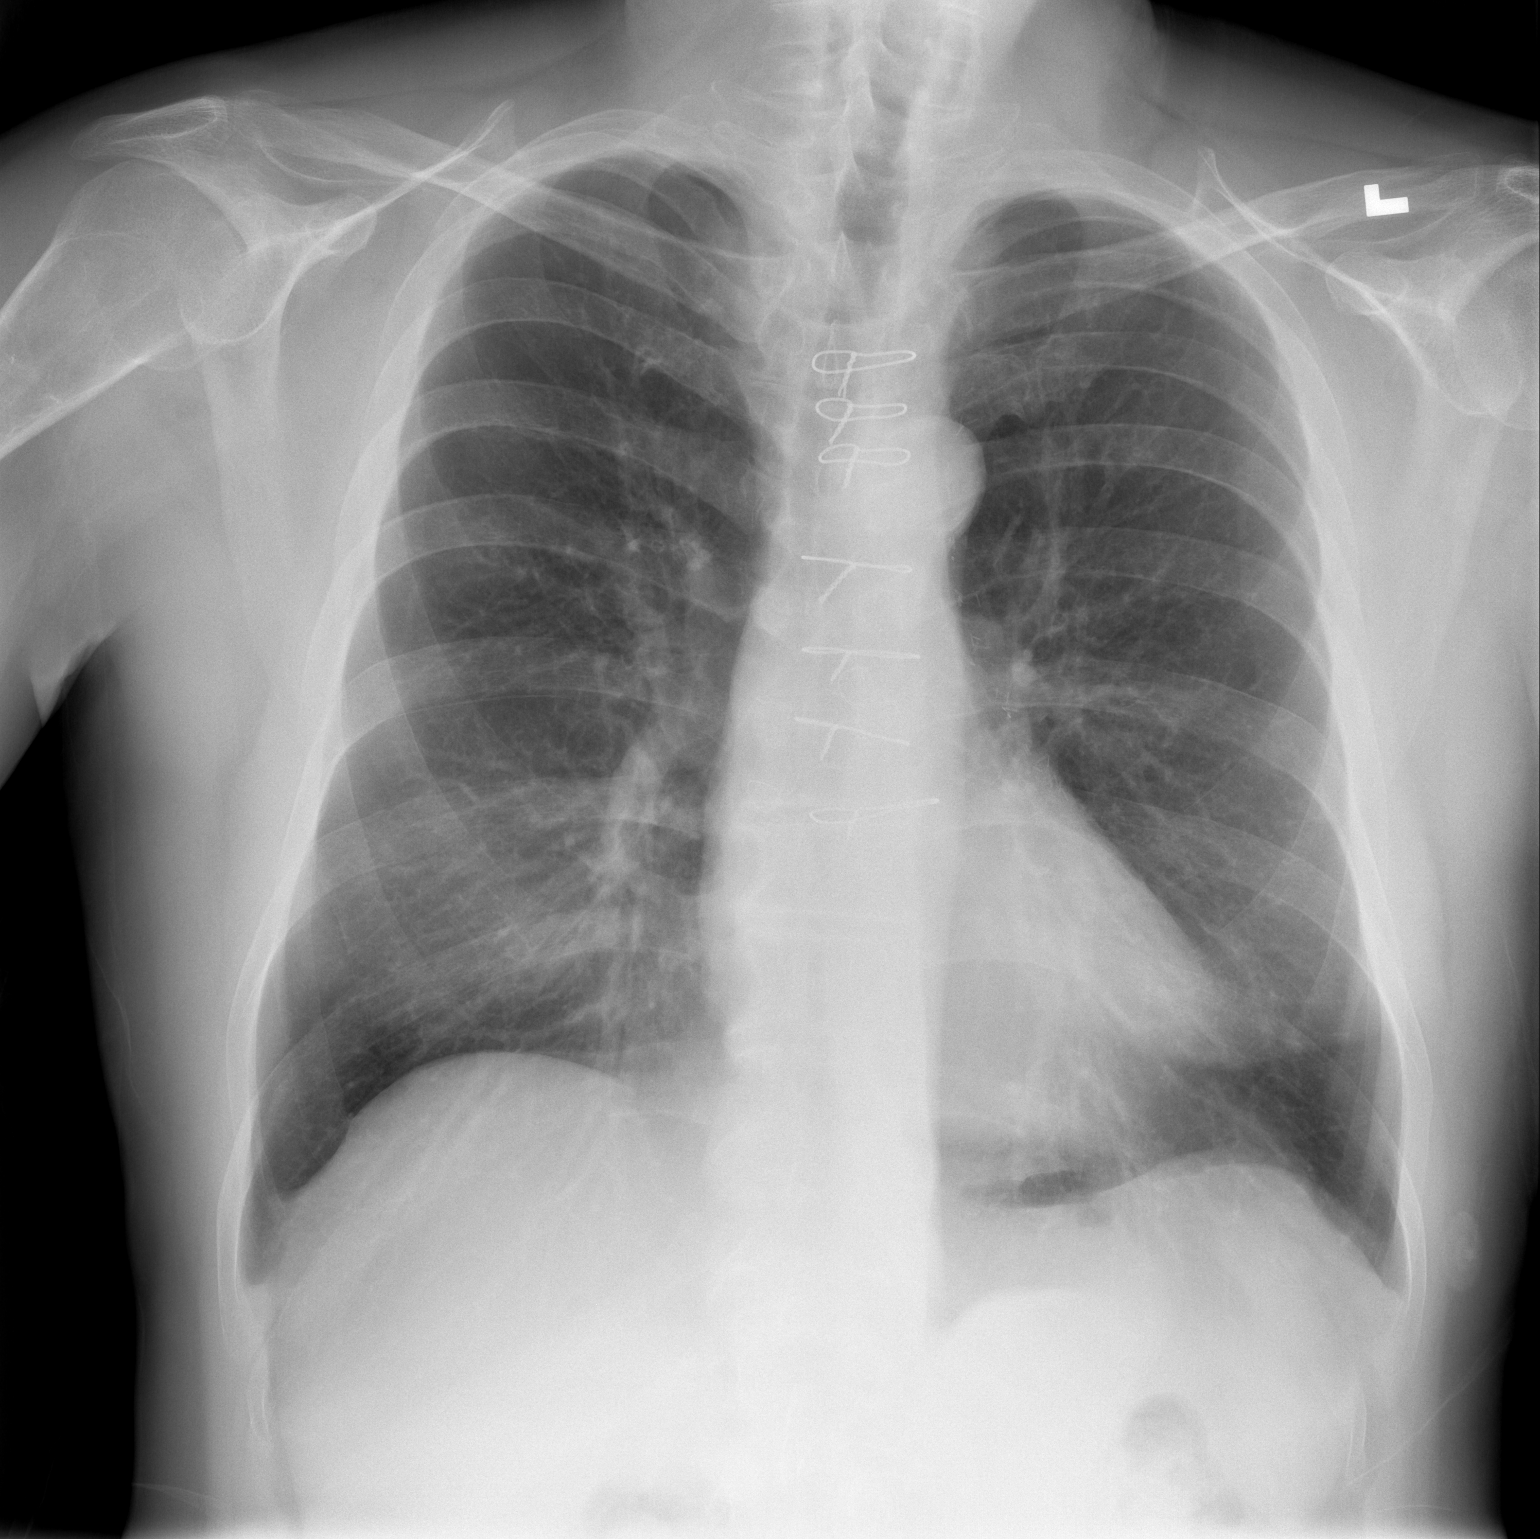

[w chest lat]
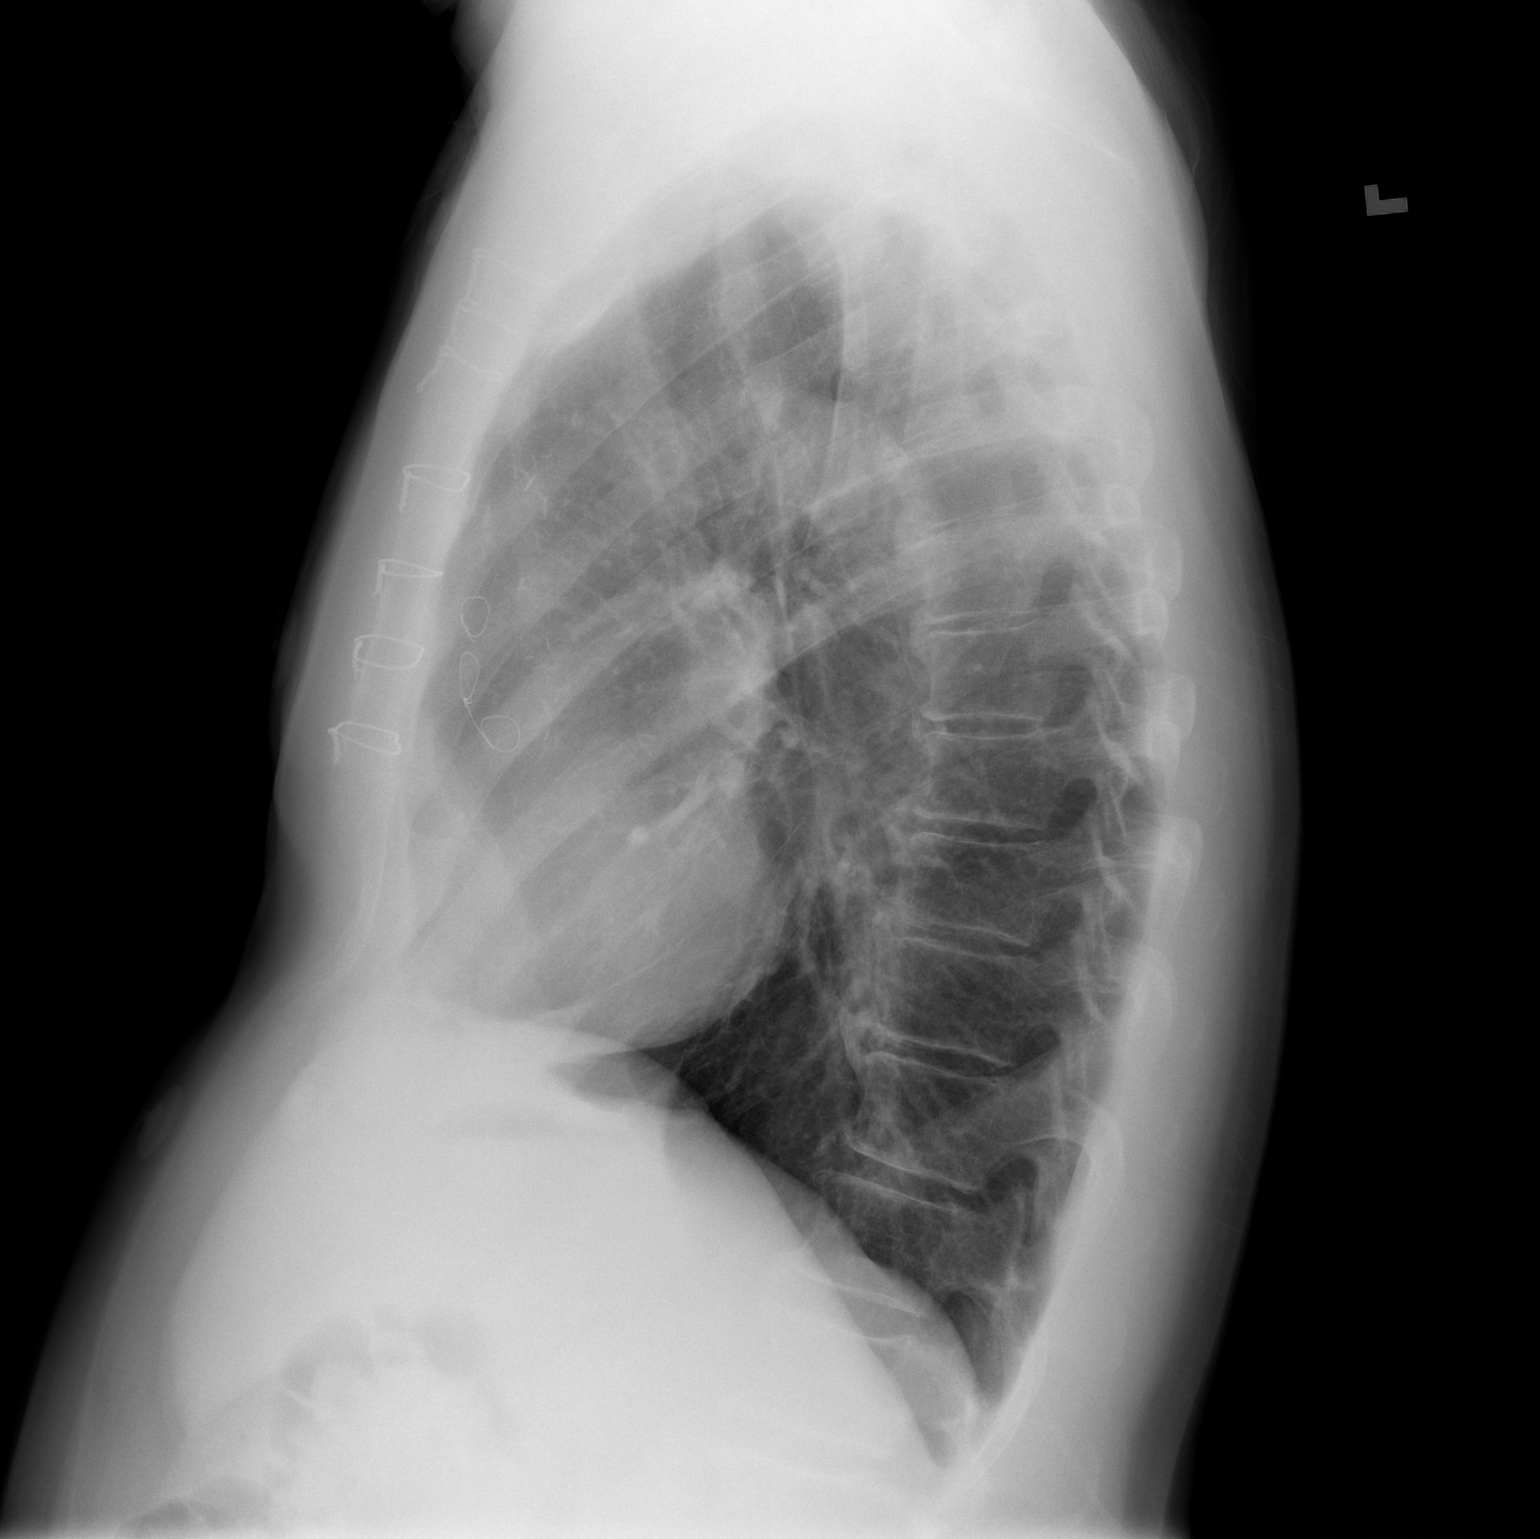

[2 of 2 positions shown; findings below may reference images not displayed]

FINDINGS: Remote median sternotomy for CABG. No focal airspace consolidation
or pulmonary edema. No pneumothorax or pleural effusion.
IMPRESSION: No active cardiopulmonary disease.

## 2019-11-07 ENCOUNTER — Observation Stay (HOSPITAL_BASED_OUTPATIENT_CLINIC_OR_DEPARTMENT_OTHER)
Admission: EM | Admit: 2019-11-07 | Discharge: 2019-11-08 | Disposition: A | Discharge status: Home / Self Care | Payer: BC Managed Care – PPO | Attending: Cardiovascular Disease | Admitting: Cardiovascular Disease

## 2019-11-07 ENCOUNTER — Other Ambulatory Visit: Payer: Self-pay

## 2019-11-07 ENCOUNTER — Emergency Department (HOSPITAL_BASED_OUTPATIENT_CLINIC_OR_DEPARTMENT_OTHER): Payer: BC Managed Care – PPO

## 2019-11-07 ENCOUNTER — Encounter (HOSPITAL_BASED_OUTPATIENT_CLINIC_OR_DEPARTMENT_OTHER): Payer: Self-pay | Admitting: *Deleted

## 2019-11-07 ENCOUNTER — Telehealth: Payer: Self-pay | Admitting: Interventional Cardiology

## 2019-11-07 DIAGNOSIS — Z20822 Contact with and (suspected) exposure to covid-19: Secondary | ICD-10-CM | POA: Insufficient documentation

## 2019-11-07 DIAGNOSIS — E7800 Pure hypercholesterolemia, unspecified: Secondary | ICD-10-CM | POA: Diagnosis not present

## 2019-11-07 DIAGNOSIS — I10 Essential (primary) hypertension: Secondary | ICD-10-CM | POA: Insufficient documentation

## 2019-11-07 DIAGNOSIS — Z7982 Long term (current) use of aspirin: Secondary | ICD-10-CM | POA: Diagnosis not present

## 2019-11-07 DIAGNOSIS — F329 Major depressive disorder, single episode, unspecified: Secondary | ICD-10-CM | POA: Diagnosis not present

## 2019-11-07 DIAGNOSIS — I252 Old myocardial infarction: Secondary | ICD-10-CM | POA: Insufficient documentation

## 2019-11-07 DIAGNOSIS — Z8619 Personal history of other infectious and parasitic diseases: Secondary | ICD-10-CM | POA: Diagnosis not present

## 2019-11-07 DIAGNOSIS — F419 Anxiety disorder, unspecified: Secondary | ICD-10-CM | POA: Diagnosis not present

## 2019-11-07 DIAGNOSIS — Z8673 Personal history of transient ischemic attack (TIA), and cerebral infarction without residual deficits: Secondary | ICD-10-CM | POA: Insufficient documentation

## 2019-11-07 DIAGNOSIS — R079 Chest pain, unspecified: Secondary | ICD-10-CM | POA: Diagnosis present

## 2019-11-07 DIAGNOSIS — I6523 Occlusion and stenosis of bilateral carotid arteries: Secondary | ICD-10-CM | POA: Insufficient documentation

## 2019-11-07 DIAGNOSIS — Z7902 Long term (current) use of antithrombotics/antiplatelets: Secondary | ICD-10-CM | POA: Insufficient documentation

## 2019-11-07 DIAGNOSIS — Z955 Presence of coronary angioplasty implant and graft: Secondary | ICD-10-CM | POA: Insufficient documentation

## 2019-11-07 DIAGNOSIS — I255 Ischemic cardiomyopathy: Secondary | ICD-10-CM | POA: Insufficient documentation

## 2019-11-07 DIAGNOSIS — Z8249 Family history of ischemic heart disease and other diseases of the circulatory system: Secondary | ICD-10-CM | POA: Insufficient documentation

## 2019-11-07 DIAGNOSIS — I25110 Atherosclerotic heart disease of native coronary artery with unstable angina pectoris: Secondary | ICD-10-CM | POA: Insufficient documentation

## 2019-11-07 DIAGNOSIS — I200 Unstable angina: Secondary | ICD-10-CM | POA: Diagnosis present

## 2019-11-07 DIAGNOSIS — Z85828 Personal history of other malignant neoplasm of skin: Secondary | ICD-10-CM | POA: Insufficient documentation

## 2019-11-07 DIAGNOSIS — Z87891 Personal history of nicotine dependence: Secondary | ICD-10-CM | POA: Diagnosis not present

## 2019-11-07 DIAGNOSIS — K219 Gastro-esophageal reflux disease without esophagitis: Secondary | ICD-10-CM | POA: Diagnosis not present

## 2019-11-07 DIAGNOSIS — I25119 Atherosclerotic heart disease of native coronary artery with unspecified angina pectoris: Secondary | ICD-10-CM

## 2019-11-07 DIAGNOSIS — E785 Hyperlipidemia, unspecified: Secondary | ICD-10-CM | POA: Diagnosis not present

## 2019-11-07 DIAGNOSIS — Z951 Presence of aortocoronary bypass graft: Secondary | ICD-10-CM | POA: Diagnosis not present

## 2019-11-07 DIAGNOSIS — Z79899 Other long term (current) drug therapy: Secondary | ICD-10-CM | POA: Diagnosis not present

## 2019-11-07 LAB — CBC WITH DIFFERENTIAL/PLATELET
Abs Immature Granulocytes: 0.02 10*3/uL (ref 0.00–0.07)
Basophils Absolute: 0 10*3/uL (ref 0.0–0.1)
Basophils Relative: 0 %
Eosinophils Absolute: 0 10*3/uL (ref 0.0–0.5)
Eosinophils Relative: 0 %
HCT: 43.8 % (ref 39.0–52.0)
Hemoglobin: 14.3 g/dL (ref 13.0–17.0)
Immature Granulocytes: 0 %
Lymphocytes Relative: 20 %
Lymphs Abs: 1.5 10*3/uL (ref 0.7–4.0)
MCH: 30.2 pg (ref 26.0–34.0)
MCHC: 32.6 g/dL (ref 30.0–36.0)
MCV: 92.4 fL (ref 80.0–100.0)
Monocytes Absolute: 0.6 10*3/uL (ref 0.1–1.0)
Monocytes Relative: 9 %
Neutro Abs: 5.1 10*3/uL (ref 1.7–7.7)
Neutrophils Relative %: 71 %
Platelets: 213 10*3/uL (ref 150–400)
RBC: 4.74 MIL/uL (ref 4.22–5.81)
RDW: 13.1 % (ref 11.5–15.5)
WBC: 7.2 10*3/uL (ref 4.0–10.5)
nRBC: 0 % (ref 0.0–0.2)

## 2019-11-07 LAB — BASIC METABOLIC PANEL
Anion gap: 10 (ref 5–15)
BUN: 19 mg/dL (ref 8–23)
CO2: 25 mmol/L (ref 22–32)
Calcium: 9.7 mg/dL (ref 8.9–10.3)
Chloride: 103 mmol/L (ref 98–111)
Creatinine, Ser: 1.05 mg/dL (ref 0.61–1.24)
GFR calc Af Amer: >60 mL/min (ref >60)
GFR calc non Af Amer: >60 mL/min (ref >60)
Glucose, Bld: 104 mg/dL — ABNORMAL HIGH (ref 70–99)
Potassium: 4.6 mmol/L (ref 3.5–5.1)
Sodium: 138 mmol/L (ref 135–145)

## 2019-11-07 LAB — HEPATIC FUNCTION PANEL
ALT: 40 U/L (ref 0–44)
AST: 32 U/L (ref 15–41)
Albumin: 3.9 g/dL (ref 3.5–5.0)
Alkaline Phosphatase: 45 U/L (ref 38–126)
Bilirubin, Direct: 0.2 mg/dL (ref 0.0–0.2)
Indirect Bilirubin: 1.1 mg/dL — ABNORMAL HIGH (ref 0.3–0.9)
Total Bilirubin: 1.3 mg/dL — ABNORMAL HIGH (ref 0.3–1.2)
Total Protein: 6.7 g/dL (ref 6.5–8.1)

## 2019-11-07 LAB — TROPONIN I (HIGH SENSITIVITY)
Troponin I (High Sensitivity): <2 ng/L (ref <18)
Troponin I (High Sensitivity): 2 ng/L (ref <18)

## 2019-11-07 LAB — RESPIRATORY PANEL BY RT PCR (FLU A&B, COVID)
Influenza A by PCR: NEGATIVE
Influenza B by PCR: NEGATIVE
SARS Coronavirus 2 by RT PCR: NEGATIVE

## 2019-11-07 LAB — D-DIMER, QUANTITATIVE: D-Dimer, Quant: 1.13 ug/mL-FEU — ABNORMAL HIGH (ref 0.00–0.50)

## 2019-11-07 LAB — LIPASE, BLOOD: Lipase: 23 U/L (ref 11–51)

## 2019-11-07 LAB — HEPARIN LEVEL (UNFRACTIONATED): Heparin Unfractionated: 0.77 IU/mL — ABNORMAL HIGH (ref 0.30–0.70)

## 2019-11-07 MED ORDER — CLOPIDOGREL BISULFATE 75 MG PO TABS
75.0000 mg | ORAL_TABLET | Freq: Every day | ORAL | Status: DC
  Administered 2019-11-08: 75 mg via ORAL
  Filled 2019-11-07: qty 1

## 2019-11-07 MED ORDER — LISINOPRIL 5 MG PO TABS
5.0000 mg | ORAL_TABLET | Freq: Every day | ORAL | Status: DC
  Administered 2019-11-08: 5 mg via ORAL
  Filled 2019-11-07: qty 1

## 2019-11-07 MED ORDER — HEPARIN (PORCINE) 25000 UT/250ML-% IV SOLN
1000.0000 [IU]/h | INTRAVENOUS | Status: DC
  Administered 2019-11-07: 1100 [IU]/h via INTRAVENOUS
  Administered 2019-11-08: 1000 [IU]/h via INTRAVENOUS
  Filled 2019-11-07 (×2): qty 250

## 2019-11-07 MED ORDER — RANOLAZINE ER 500 MG PO TB12
1000.0000 mg | ORAL_TABLET | Freq: Two times a day (BID) | ORAL | Status: DC
  Administered 2019-11-07 – 2019-11-08 (×2): 1000 mg via ORAL
  Filled 2019-11-07 (×2): qty 2

## 2019-11-07 MED ORDER — ONDANSETRON HCL 4 MG/2ML IJ SOLN
4.0000 mg | Freq: Once | INTRAMUSCULAR | Status: AC
  Administered 2019-11-07: 4 mg via INTRAVENOUS
  Filled 2019-11-07: qty 2

## 2019-11-07 MED ORDER — AMLODIPINE BESYLATE 10 MG PO TABS: 10.0000 mg | ORAL_TABLET | Freq: Every day | ORAL | Status: DC

## 2019-11-07 MED ORDER — FENTANYL CITRATE (PF) 100 MCG/2ML IJ SOLN
50.0000 ug | Freq: Once | INTRAMUSCULAR | Status: AC
  Administered 2019-11-07: 50 ug via INTRAVENOUS
  Filled 2019-11-07: qty 2

## 2019-11-07 MED ORDER — DOXEPIN HCL 10 MG/ML PO CONC
6.0000 mg | Freq: Every evening | ORAL | Status: DC | PRN
  Administered 2019-11-07: 6 mg via ORAL
  Filled 2019-11-07 (×3): qty 0.6

## 2019-11-07 MED ORDER — HEPARIN BOLUS VIA INFUSION
4000.0000 [IU] | Freq: Once | INTRAVENOUS | Status: AC
  Administered 2019-11-07: 4000 [IU] via INTRAVENOUS

## 2019-11-07 MED ORDER — ATORVASTATIN CALCIUM 80 MG PO TABS
80.0000 mg | ORAL_TABLET | Freq: Every evening | ORAL | Status: DC
  Administered 2019-11-07: 80 mg via ORAL
  Filled 2019-11-07: qty 1

## 2019-11-07 MED ORDER — ACETAMINOPHEN 325 MG PO TABS
650.0000 mg | ORAL_TABLET | ORAL | Status: DC | PRN
  Administered 2019-11-07 – 2019-11-08 (×4): 650 mg via ORAL
  Filled 2019-11-07 (×4): qty 2

## 2019-11-07 MED ORDER — IOHEXOL 350 MG/ML SOLN
100.0000 mL | Freq: Once | INTRAVENOUS | Status: AC | PRN
  Administered 2019-11-07: 100 mL via INTRAVENOUS

## 2019-11-07 MED ORDER — METOPROLOL SUCCINATE ER 25 MG PO TB24
12.5000 mg | ORAL_TABLET | Freq: Every day | ORAL | Status: DC
  Administered 2019-11-08: 12.5 mg via ORAL
  Filled 2019-11-07: qty 1

## 2019-11-07 MED ORDER — ASPIRIN EC 81 MG PO TBEC
81.0000 mg | DELAYED_RELEASE_TABLET | Freq: Every day | ORAL | Status: DC
  Administered 2019-11-08: 81 mg via ORAL
  Filled 2019-11-07: qty 1

## 2019-11-07 MED ORDER — NITROGLYCERIN IN D5W 200-5 MCG/ML-% IV SOLN
0.0000 ug/min | INTRAVENOUS | Status: DC
  Administered 2019-11-07: 10 ug/min via INTRAVENOUS
  Administered 2019-11-07: 5 ug/min via INTRAVENOUS
  Filled 2019-11-07: qty 250

## 2019-11-07 MED ORDER — NITROGLYCERIN 0.4 MG SL SUBL
0.4000 mg | SUBLINGUAL_TABLET | SUBLINGUAL | Status: DC | PRN
  Administered 2019-11-07 (×2): 0.4 mg via SUBLINGUAL
  Filled 2019-11-07 (×2): qty 1

## 2019-11-07 MED ORDER — ONDANSETRON HCL 4 MG/2ML IJ SOLN
4.0000 mg | Freq: Four times a day (QID) | INTRAMUSCULAR | Status: DC | PRN
  Administered 2019-11-08: 4 mg via INTRAVENOUS
  Filled 2019-11-07: qty 2

## 2019-11-07 MED ORDER — ASPIRIN 81 MG PO CHEW
324.0000 mg | CHEWABLE_TABLET | Freq: Once | ORAL | Status: AC
  Administered 2019-11-07: 324 mg via ORAL
  Filled 2019-11-07: qty 4

## 2019-11-07 MED ORDER — PANTOPRAZOLE SODIUM 40 MG PO TBEC
40.0000 mg | DELAYED_RELEASE_TABLET | Freq: Every day | ORAL | Status: DC
  Administered 2019-11-08: 40 mg via ORAL
  Filled 2019-11-07: qty 1

## 2019-11-07 MED ORDER — CHLORDIAZEPOXIDE HCL 5 MG PO CAPS: 10.0000 mg | ORAL_CAPSULE | Freq: Three times a day (TID) | ORAL | Status: DC | PRN

## 2019-11-07 NOTE — Progress Notes (Signed)
ANTICOAGULATION CONSULT NOTE - Initial Consult  Pharmacy Consult for Heparin Indication: chest pain/ACS  Allergies  Allergen Reactions  . Prednisone Other (See Comments)    States that this med makes him "crazy"  . Tetanus Toxoids Swelling and Other (See Comments)    Fever, Swelling of the arm   . Wellbutrin [Bupropion] Other (See Comments)    Crazy thoughts, nightmares  . Morphine And Related Hives and Itching    Redness at the injection site  . Scallops [Shellfish Allergy] Nausea Only  . Chantix [Varenicline] Other (See Comments)    Dreams    Patient Measurements: Weight: 208 lb 1.6 oz (94.4 kg) Heparin Dosing Weight: 90 kg  Vital Signs: Temp: 97.7 F (36.5 C) (03/10 2107) Temp Source: Oral (03/10 2107) BP: 116/66 (03/10 2107) Pulse Rate: 62 (03/10 2107)  Labs: Recent Labs    11/07/19 0958 11/07/19 1006 11/07/19 1150 11/07/19 2139  HGB  --  14.3  --   --   HCT  --  43.8  --   --   PLT  --  213  --   --   HEPARINUNFRC  --   --   --  0.77*  CREATININE  --  1.05  --   --   TROPONINIHS <2  --  2  --     Estimated Creatinine Clearance: 79.2 mL/min (by C-G formula based on SCr of 1.05 mg/dL).   Medical History: Past Medical History:  Diagnosis Date  . Anxiety   . Arthritis   . Basal cell carcinoma (BCC) of forehead   . CAD (coronary artery disease)    a. 10/2015 ant STEMI >> LHC with 3 v CAD; oLAD tx with POBA >> emergent CABG. b. Multiple evals since that time, early graft failure of SVG-RCA by cath 03/2016. c. 2/19 PCI/DES x1 to pRCA, normal EF.  . Carotid artery disease ([place])    a. 40-59% BICA 02/2018.  Marland Kitchen Depression   . Dyspnea   . Ectopic atrial tachycardia ([place])   . Esophageal reflux    eosinophil esophagitis  . Family history of adverse reaction to anesthesia    "sister has PONV" (06/21/2017)  . Former tobacco use   . Gout   . Hepatitis C    "treated and cured" (06/21/2017)  . High cholesterol   . History of kidney stones   . Hypertension   .  Ischemic cardiomyopathy    a. EF 25-30% at intraop TEE 4/17  //  b. Limited Echo 5/17 - EF 45-50%, mild ant HK. c. EF 55-65% by cath 09/2017.  . Migraine    "3-4/yr" (06/21/2017)  . Myocardial infarction ([place]) 10/2015  . Palpitations   . Sinus bradycardia    a. HR dropping into 40s in 02/2016 -> BB reduced.  . Stroke ([place]) 10/2016   "small one; sometimes my memory/cognitive issues" (06/21/2017)  . Symptomatic hypotension    a. 02/2016 ER visit -> meds reduced.  . Syncope    Assessment: 64 yr old male with hx of CAD presented with CP, hx of ACS. Pharmacy consulted to dose heparin. Pt was not on anticoagulation PTA.   EKG showed no acute ischemic changes, high-sensitivity troponin negative X 2. Pt still with atypical CP; pt may be scheduled for cath, depending on improvement overnight.  Heparin level drawn ~8.5 hrs after heparin 4000 units IV bolus, followed by heparin infusion at 1100 units/hr, was 0.77 units/ml, which is above the goal range for this pt. CBC WNL. Per RN,  no issues with IV or bleeding observed.  Goal of Therapy:  Heparin level 0.3-0.7 units/ml Monitor platelets by anticoagulation protocol: Yes   Plan:  Decrease heparin infusion to 1000 units/hr Check 6-hr heparin level Monitor daily heparin level, CBC Monitor for signs/symptoms of bleeding  Gillermina Hu, PharmD, BCPS, [facility] Clinical Pharmacist 11/07/2019 10:09 PM

## 2019-11-07 NOTE — ED Triage Notes (Signed)
Complain of chest pain after getting out of shower.  Patient is touching his right and left upper abdominal area.

## 2019-11-07 NOTE — H&P (Addendum)
Cardiology Admission History and Physical:   [name] ID: [name] MRN: [id number]; DOB: 1956-04-12   Admission date: 11/07/2019  Primary Care Provider: [name], MD Primary Cardiologist: Larae Grooms, MD  Primary Electrophysiologist:  None   Chief Complaint:  Chest Pain  [name] Profile:   Graciano Batson is a 64 y.o. male with a history of CAD s/p anterior STEMI and emergent CABG in 2017 with multiple subsequent cardiac catheterizations and PCI to RCA in 09/2017, bilateral carotid artery disease, stroke in 10/2016, ischemic cardiomyopathy with EF of 55-60%, hypertension, hyperlipidemia, former tobacco abuse, hepatitis C, anxiety, and depression who was admitted with chest pain.   History of Present Illness:   [name] is a 64 year old male with the above history who is followed by Dr. Irish Lack. [name] had an anterior STEMI and emergent CABG in 2017. Since then, he has had multiple episodes of chest pain and multiple cardiac catheterizations in 2018 not resulting in PCI. He admits to having some anxiety as well. In 09/2017, he had a cardiac catheterization with PCI to RCA due to narrowing of the graft to the distal RCA. [name] presented to the ED in 05/2018 with palpitations and heart rate up to 120 but normal when he arrived. Also had atypical chest pain anxiety was felt to be a component and he requested narcotics for relief. Event monitor 06/13/2018 normal sinus rhythm with occasional sinus bradycardia and sinus tachycardia. No atrial fibrillation or pathologic arrhythmias. [name] presented back to the ED in 08/2018 for atypical chest pain that was reproducible. Echo showed LVEF of 55-60% with no valvular problems. Chest CTA showed no evidence of aortic dissection and mild prominence of the ascending aorta at 3.9 x 3.9 cm. He was admitted again in 03/2019 with chest pain. He underwent cardiac catheterization which showed stable and continued medical therapy was recommended.  Chest pain was felt to be GI in nature.   [name] called our office this morning and reported shortness of breath around 6am. BP elevated at 180/120 and heart rate elevated at 160 bpm at that time. He took 1 sublingual Nitro and BP improved to 122/84 but heart rate still elevated at 124 bpm. At time of call, he was having active chest pain with left arm pain. He was instructed to go to the ED for further evaluation which he did.   Upon arrival to the ED, [name] hypertensive with BP of 155/91 but vitals stable. EKG showed normal sinus rhythm but no acute ischemic changes. Chest x-ray showed patchy atelectasis/consolidation at the lung bases. High-sensitivity troponin negative x2. D-dimer elevated at 1.13. Chest CTA negative for PE. WBC 7.2, Hgb 14.3, Plts 213. Na 138, K 4.6, Glucose 104, BUN 19, Cr 1.05. COVID-19 negative.   At the time of this evaluation, [name] resting comfortably in bed. I clarified events from earlier this morning. He states he woke up about 6 AM and just did not feel right and states he did not have an appetite.  He went to get a shower when he got out of the shower he felt very short of breath like he had just finished exercising.  He denies any chest pain or palpitations at this time.  However he decided to check his blood pressure.  BP was elevated in the 180s/120s at that time and heart rate was elevated in the 140s to 150s.  He took a Nitro at that time with no significant improvement in shortness of breath.  His BP did improve some but his  heart rate remained elevated.  Again, he denies any chest pain at this time.  However, while in the ED around 10 to 11 AM, he states he developed upper abdominal/lower chest discomfort more towards the left side.  He describes this discomfort as a tightness.  He described pleuritic pain in the ED but denies this to me. Nothing seems to make it better or worse. Earlier this morning, he states he felt a weird numb sensation on his left arm but  states that could have just been the way he was laying.  No other real radiation to neck or arm. He also notes nausea but no vomiting.  He was given fentanyl in the ED and states pain resolved for about an hour and a half and then returned.  He currently reports 4 out of 10 upper abdominal pain.  All of the symptoms came on very suddenly.  Reports that he was in his usual state of health until this morning.  No recent fevers or illnesses.  No abnormal bleeding on dual antiplatelet therapy.  He states this discomfort is somewhat similar to prior cardiac pain but much less severe. He initially had a headache after sublingual Nitro but states this has resolved.   Heart Pathway Score:     Past Medical History:  Diagnosis Date   Anxiety    Arthritis    Basal cell carcinoma (BCC) of forehead    CAD (coronary artery disease)    a. 10/2015 ant STEMI >> LHC with 3 v CAD; oLAD tx with POBA >> emergent CABG. b. Multiple evals since that time, early graft failure of SVG-RCA by cath 03/2016. c. 2/19 PCI/DES x1 to pRCA, normal EF.   Carotid artery disease ([place])    a. 40-59% BICA 02/2018.   Depression    Dyspnea    Ectopic atrial tachycardia (HCC)    Esophageal reflux    eosinophil esophagitis   Family history of adverse reaction to anesthesia    "sister has PONV" (06/21/2017)   Former tobacco use    Gout    Hepatitis C    "treated and cured" (06/21/2017)   High cholesterol    History of kidney stones    Hypertension    Ischemic cardiomyopathy    a. EF 25-30% at intraop TEE 4/17  //  b. Limited Echo 5/17 - EF 45-50%, mild ant HK. c. EF 55-65% by cath 09/2017.   Migraine    "3-4/yr" (06/21/2017)   Myocardial infarction ([place]) 10/2015   Palpitations    Sinus bradycardia    a. HR dropping into 40s in 02/2016 -> BB reduced.   Stroke [facility]) 10/2016   "small one; sometimes my memory/cognitive issues" (06/21/2017)   Symptomatic hypotension    a. 02/2016 ER visit -> meds reduced.    Syncope     Past Surgical History:  Procedure Laterality Date   ANTERIOR CERVICAL DECOMP/DISCECTOMY FUSION N/A 10/17/2018   Procedure: Anterior Cervical Decompression Fusion - Cervical seven -Thoracic one;  Surgeon: Consuella Lose, MD;  Location: [place];  Service: Neurosurgery;  Laterality: N/A;   BASAL CELL CARCINOMA EXCISION     "forehead   BIOPSY  07/20/2019   Procedure: BIOPSY;  Surgeon: Carol Ada, MD;  Location: [facility];  Service: Endoscopy;;   CARDIAC CATHETERIZATION N/A 11/28/2015   Procedure: Left Heart Cath and Coronary Angiography;  Surgeon: [name], MD;  Location: [place] [facility];  Service: Cardiovascular;  Laterality: N/A;   CARDIAC CATHETERIZATION N/A 11/28/2015   Procedure:  Coronary Balloon Angioplasty;  Surgeon: [name], MD;  Location: [place] [facility];  Service: Cardiovascular;  Laterality: N/A;  ostial LAD   CARDIAC CATHETERIZATION N/A 11/28/2015   Procedure: Coronary/Graft Angiography;  Surgeon: [name], MD;  Location: [place] [facility];  Service: Cardiovascular;  Laterality: N/A;  coronaries only    CARDIAC CATHETERIZATION N/A 04/21/2016   Procedure: Left Heart Cath and Coronary Angiography;  Surgeon: Wellington Hampshire, MD;  Location: [place] [facility];  Service: Cardiovascular;  Laterality: N/A;   CARDIAC CATHETERIZATION N/A 06/14/2016   Procedure: Left Heart Cath and Cors/Grafts Angiography;  Surgeon: Lorretta Harp, MD;  Location: [place] [facility];  Service: Cardiovascular;  Laterality: N/A;   CARDIAC CATHETERIZATION N/A 09/08/2016   Procedure: Left Heart Cath and Cors/Grafts Angiography;  Surgeon: Wellington Hampshire, MD;  Location: [place] [facility];  Service: Cardiovascular;  Laterality: N/A;   CARDIAC CATHETERIZATION     CORONARY ARTERY BYPASS GRAFT N/A 11/28/2015   Procedure: CORONARY ARTERY BYPASS GRAFTING (CABG) TIMES FIVE USING LEFT INTERNAL MAMMARY ARTERY AND RIGHT GREATER SAPHENOUS,VIEN HARVEATED BY  ENDOVIEN, INTRAOPPRATIVE TEE;  Surgeon: Gaye Pollack, MD;  Location: [place];  Service: Open Heart Surgery;  Laterality: N/A;   CORONARY STENT INTERVENTION N/A 10/05/2017   Procedure: CORONARY STENT INTERVENTION;  Surgeon: [name], MD;  Location: [place] [facility];  Service: Cardiovascular;  Laterality: N/A;   ESOPHAGOGASTRODUODENOSCOPY (EGD) WITH PROPOFOL N/A 07/20/2019   Procedure: ESOPHAGOGASTRODUODENOSCOPY (EGD) WITH PROPOFOL;  Surgeon: Carol Ada, MD;  [facility];  Service: Endoscopy;  Laterality: N/A;   HUMERUS SURGERY Right 1969   "tumor inside bone; filled it w/bone chips"   LEFT HEART CATH AND CORS/GRAFTS ANGIOGRAPHY N/A 03/11/2017   Procedure: Left Heart Cath and Cors/Grafts Angiography;  Surgeon: Leonie Man, MD;  Location: [place] [facility];  Service: Cardiovascular;  Laterality: N/A;   LEFT HEART CATH AND CORS/GRAFTS ANGIOGRAPHY N/A 10/05/2017   Procedure: LEFT HEART CATH AND CORS/GRAFTS ANGIOGRAPHY;  Surgeon: [name], MD;  Location: [place] [facility];  Service: Cardiovascular;  Laterality: N/A;   LEFT HEART CATH AND CORS/GRAFTS ANGIOGRAPHY N/A 04/11/2019   Procedure: LEFT HEART CATH AND CORS/GRAFTS ANGIOGRAPHY;  Surgeon: [name], MD;  Location: [place] [facility];  Service: Cardiovascular;  Laterality: N/A;   PERIPHERAL VASCULAR CATHETERIZATION N/A 06/14/2016   Procedure: Lower Extremity Angiography;  Surgeon: Lorretta Harp, MD;  Location: [place] [facility];  Service: Cardiovascular;  Laterality: N/A;     Medications Prior to Admission: Prior to Admission medications   Medication Sig Start Date End Date Taking? Authorizing Provider  acetaminophen (TYLENOL) 500 MG tablet Take 1,000 mg by mouth every 6 (six) hours as needed for mild pain or headache.    Yes [provider]  amLODipine (NORVASC) 10 MG tablet Take 1 tablet (10 mg total) by mouth daily. 03/07/19  Yes [name], MD  aspirin 81 MG tablet  Take 1 tablet (81 mg total) by mouth daily. 10/24/18  Yes Costella, Vista Mink, PA-C  atorvastatin (LIPITOR) 80 MG tablet TAKE 1 TABLET(80 MG) BY MOUTH DAILY [name] taking differently: Take 80 mg by mouth every evening.  09/21/18  Yes [name], MD  chlordiazePOXIDE (LIBRIUM) 10 MG capsule Take 10-20 mg by mouth 3 (three) times daily as needed for anxiety.    Yes [provider]  clopidogrel (PLAVIX) 75 MG tablet Take 1 tablet (75 mg total) by mouth daily. 10/24/18  Yes Costella, Vista Mink, PA-C  Doxepin HCl 6 MG TABS Take 6 mg by mouth at bedtime as needed for sleep. 02/03/19  Yes [provider]  lisinopril (ZESTRIL) 5 MG tablet Take 5 mg by mouth daily. May take 1 additional tablet once daily for SBP greater than 150 once hour after taking daily dose 03/12/19  Yes [name], MD  metoprolol succinate (TOPROL-XL) 25 MG 24 hr tablet Take 0.5 tablets (12.5 mg total) by mouth daily. Take with or immediately following a meal. [name] taking differently: Take 25 mg by mouth daily. Take with or immediately following a meal. 01/23/19  Yes [name], MD  Multiple Vitamins-Minerals (ONE-A-DAY MENS 50+ ADVANTAGE PO) Take 1 tablet by mouth daily.    Yes [provider]  nitroGLYCERIN (NITROSTAT) 0.4 MG SL tablet PLACE 1 TABLET UNDER THE TONGUE EVERY 5 MINUTES AS NEEDED FOR CHEST PAIN. 3 DOSES MAX [name] taking differently: Place 0.4 mg under the tongue every 5 (five) minutes as needed for chest pain. 3 DOSES MAX 08/06/19  Yes [name], MD  pantoprazole (PROTONIX) 40 MG tablet Take 40 mg by mouth daily.   Yes [provider]  ranolazine (RANEXA) 1000 MG SR tablet TAKE ONE TABLET BY MOUTH TWICE DAILY [name] taking differently: Take 1,000 mg by mouth daily.  03/08/19  Yes [name] T, PA-C     Allergies:    Allergies  Allergen Reactions   Prednisone Other (See Comments)    States that this med makes him "crazy"   Tetanus Toxoids  Swelling and Other (See Comments)    Fever, Swelling of the arm    Wellbutrin [Bupropion] Other (See Comments)    Crazy thoughts, nightmares   Morphine And Related Hives and Itching    Redness at the injection site   Scallops [Shellfish Allergy] Nausea Only   Chantix [Varenicline] Other (See Comments)    Dreams    Social History:   Social History   Socioeconomic History   Marital status: Married    Spouse name: [name]   Number of children: 3   Years of education: College   Highest education level: Not on file  Occupational History   Occupation: Scientist, research (physical sciences): SELF-EMPLOYED  Tobacco Use   Smoking status: Former Smoker    Packs/day: 0.75    Years: 44.00    Pack years: 33.00    Types: Cigarettes    Quit date: 11/28/2015    Years since quitting: 3.9   Smokeless tobacco: Never Used  Substance and Sexual Activity   Alcohol use: Yes    Comment: occ   Drug use: No    Comment: 06/21/2017 "nothing since the 1980s"   Sexual activity: Yes  Other Topics Concern   Not on file  Social History Narrative   [name] lives at home with his spouse.   Caffeine Use: yes   Social Determinants of Health   Financial Resource Strain:    Difficulty of Paying Living Expenses: Not on file  Food Insecurity:    Worried About Charity fundraiser in the Last Year: Not on file   [employer] of Food in the Last Year: Not on file  Transportation Needs:    Lack of Transportation (Medical): Not on file   Lack of Transportation (Non-Medical): Not on file  Physical Activity:    Days of Exercise per Week: Not on file   Minutes of Exercise per Session: Not on file  Stress:  Feeling of Stress : Not on file  Social Connections:    Frequency of Communication with Friends and Family: Not on file   Frequency of Social Gatherings with Friends and Family: Not on file   Attends Religious Services: Not on file   Active Member of Clubs or Organizations: Not on file    Attends Archivist Meetings: Not on file   Marital Status: Not on file  Intimate Partner Violence:    Fear of Current or Ex-Partner: Not on file   Emotionally Abused: Not on file   Physically Abused: Not on file   Sexually Abused: Not on file    Family History:   The [name]'s family history includes Heart Problems in his father; Heart attack in his maternal grandmother and paternal uncle; Heart attack (age of onset: 7) in his father; Heart failure in his father; Hypertension in his brother; Lung cancer in his mother; Stroke in his father and maternal grandmother. There is no history of Autoimmune disease.    ROS:  Please see the history of present illness.  Review of Systems  Constitutional: Positive for fever.  HENT: Negative for congestion.   Eyes: Negative for blurred vision.  Respiratory: Positive for shortness of breath. Negative for cough.   Cardiovascular: Positive for chest pain. Negative for palpitations, orthopnea, leg swelling and PND.  Gastrointestinal: Positive for abdominal pain and nausea. Negative for blood in stool, melena and vomiting.  Genitourinary: Negative for hematuria.  Musculoskeletal: Negative for myalgias.  Neurological: Negative for focal weakness and loss of consciousness.  Endo/Heme/Allergies: Does not bruise/bleed easily.  Psychiatric/Behavioral: Negative for substance abuse.    Physical Exam/Data:   Vitals:   11/07/19 0949 11/07/19 0954 11/07/19 1438 11/07/19 1537  BP:  (!) 155/91 106/62 116/72  Pulse:  (!) 102 63 71  Resp:  16 18 20   Temp:  98.8 F (37.1 C)  97.9 F (36.6 C)  TempSrc:  Oral  Oral  SpO2:  97% 98%   Weight: 94.4 kg       Intake/Output Summary (Last 24 hours) at 11/07/2019 1723 Last data filed at 11/07/2019 1600 Gross per 24 hour  Intake 74.76 ml  Output --  Net 74.76 ml   Last 3 Weights 11/07/2019 08/06/2019 07/20/2019  Weight (lbs) 208 lb 1.6 oz 195 lb 195 lb  Weight (kg) 94.394 kg 88.451 kg 88.451 kg      Body mass index is 31.64 kg/m.  General: 64 y.o. male resting comfortably in no acute distress. HEENT: Normocephalic and atraumatic. Sclera clear.  Neck: Supple. No carotid bruits. No JVD. Heart: RRR. Distinct S1 and S2. No murmurs, gallops, or rubs. Radial and distal pedal pulses 2+ and equal bilaterally. Lungs: No increased work of breathing. Clear to ausculation bilaterally. No wheezes, rhonchi, or rales.  Abdomen: Soft, non-distended, and non-tender to palpation. Bowel sounds present.  Extremities: No clubbing, cyanosis, or edema.    Skin: Warm and dry. Neuro: Alert and oriented x3. No focal deficits. Psych: Normal affect. Responds appropriately.  EKG:  The ECG that was done was personally reviewed and demonstrates normal sinus rhythm, rate 82 bpm, with borderline right axis deviation but no acute ischemic changes.   Telemetry: Telemetry personally reviewed and demonstrates normal sinus rhythm with rates in the 60's to 90's.   Relevant CV Studies:  Event Monitor 06/13/2018:  Sinus rhtyhm predominantly. Occasional sinus bradycardia and sinus tachycardia.  No atrial fibrillation.  No pathologic arrhtyhmias.  No cardiac cuase of syncope identified. _______________  Echocardiogram  09/28/2018: Impressions: 1. The left ventricle has normal systolic function of 86-76%. The cavity  size is normal. There is normal left ventricular hypertrophy. Echo evidence of normal diastolic filling patterns.  2. Normal left atrial size.  3. Normal right atrial size.  4. Normal tricuspid valve.  5. Tricuspid regurgitation is mild.  6. No atrial level shunt detected by color flow Doppler.  _______________  Left Heart Catheterization 04/11/2019:  Mid LAD lesion is 55% stenosed.  Ost LAD to Prox LAD lesion is 50% stenosed. LIMA to LAD is patent.  Ost Ramus lesion is 90% stenosed.  Ramus lesion is 75% stenosed.  Ost Cx lesion is 80% stenosed.  Prox Cx to Mid Cx lesion is 50%  stenosed. SVG to OM is patent.  Dist RCA lesion is 50% stenosed.  RPDA lesion is 40% stenosed. SVG to PDA is patent with proximal disease that is unchanged.  Previously placed Ost RCA to Prox RCA drug eluting stent is widely patent.  Graft to the diagonal was occluded. There was TIMI 3 flow in the small diagonal with proximal ectasia.  The left ventricular systolic function is normal.  LV end diastolic pressure is normal. LVEDP 11 mm Hg.  The left ventricular ejection fraction is 50-55% by visual estimate.  There is no aortic valve stenosis.   Continue medical therapy.     Laboratory Data:  High Sensitivity Troponin:   Recent Labs  Lab 11/07/19 0958 11/07/19 1150  TROPONINIHS <2 2      Chemistry Recent Labs  Lab 11/07/19 1006  NA 138  K 4.6  CL 103  CO2 25  GLUCOSE 104*  BUN 19  CREATININE 1.05  CALCIUM 9.7  GFRNONAA >60  GFRAA >60  ANIONGAP 10    No results for input(s): PROT, ALBUMIN, AST, ALT, ALKPHOS, BILITOT in the last 168 hours. Hematology Recent Labs  Lab 11/07/19 1006  WBC 7.2  RBC 4.74  HGB 14.3  HCT 43.8  MCV 92.4  MCH 30.2  MCHC 32.6  RDW 13.1  PLT 213   BNPNo results for input(s): BNP, PROBNP in the last 168 hours.  DDimer  Recent Labs  Lab 11/07/19 1006  DDIMER 1.13*     Radiology/Studies:  DG Chest 2 View  Result Date: 11/07/2019 CLINICAL DATA:  Chest pain EXAM: CHEST - 2 VIEW COMPARISON:  04/11/2019 FINDINGS: Patchy density at the lung bases. No pleural effusion or pneumothorax. Cardiomediastinal contours are within normal limits. No acute osseous abnormality. IMPRESSION: Patchy atelectasis/consolidation at the lung bases. Electronically Signed   By: Macy Mis M.D.   On: 11/07/2019 10:34   CT Angio Chest PE W and/or Wo Contrast  Result Date: 11/07/2019 CLINICAL DATA:  Shortness of breath EXAM: CT ANGIOGRAPHY CHEST WITH CONTRAST TECHNIQUE: Multidetector CT imaging of the chest was performed using the standard protocol  during bolus administration of intravenous contrast. Multiplanar CT image reconstructions and MIPs were obtained to evaluate the vascular anatomy. CONTRAST:  159mL OMNIPAQUE IOHEXOL 350 MG/ML SOLN COMPARISON:  04/11/2019 FINDINGS: Cardiovascular: Satisfactory opacification of the pulmonary arteries to the segmental level. No evidence of pulmonary embolism. Normal heart size. No pericardial effusion. Evidence of prior CABG. Calcified plaque is present the thoracic aorta. Mediastinum/Nodes: No mediastinal, hilar, or axillary adenopathy. Thyroid and esophagus are unremarkable. Lungs/Pleura: No pleural effusion or pneumothorax. No consolidation or mass. Upper Abdomen: No acute abnormality. Musculoskeletal: Anterior fusion at C7-T1. No acute osseous abnormality. Review of the MIP images confirms the above findings. IMPRESSION: No evidence of acute pulmonary  embolism. No additional acute finding. Aortic Atherosclerosis (ICD10-I70.0). Electronically Signed   By: Macy Mis M.D.   On: 11/07/2019 11:05   TIMI Risk Score for Unstable Angina or Non-ST Elevation MI:   The [name]'s TIMI risk score is 3, which indicates a 13% risk of all cause mortality, new or recurrent myocardial infarction or need for urgent revascularization in the next 14 days.   Assessment and Plan:   Chest Pain with Known CAD - [name] reported chest pain on the phone with our office this morning and again in the ED but describes more upper abdominal pain to me. He has a history of anterior STEMI and emergent CABG in 2017 with multiple subsequent cardiac catheterizations and PCI to RCA in 09/2017 - EKG showed no acute ischemic changes. - High-sensitivity troponin negative 2.  - Continue to have upper abdominal pain at this time. Currently ranks it as a 4/10.  - Currently on IV Heparin and IV Nitrogren drip.  - Presentation sounds somewhat atypical. Will check LFTs and lipase to see if their is a GI component. Will also given GI cocktail to  see if that helps. However, given significant history of CAD, will make NPO at midnight and can see how he does overnight night. May need repeat cardiac catheterization. Continue IV Heparin and IV Nitro at this time.  - Continue dual antiplatelel therapy with Aspirin and Plavix. Continue Toprol-XL 12.5mg  daily. Will increase Ranexa to 1000mg  twice daily ([name] states he is only taking it once daily). Continue Lipitor 80mg  daily. Continue Protonix.  Ischemic Cardiomyopathy - LVEF of 55-60% on Echo in 08/2018.  - [name] appears euvolemic but will check BNP given complaints of shortness of breath. - No need for diuretics at this time. - Continue Lisinopril 5mg  daily and Toprol-XL 12.5mg  daily. - Continue to monitor volume status closely.   Bilateral Carotid Artery Disease - Carotid ultrasounds in 02/2018 showed 40-59% stenosis of bilateral ICAs.  - Continue Aspirin 81mg  daily, Plavix 75mg  daily, Lipitor 80mg  daily. - Needs repeat dopplers. But this can be done as outpatient.    Hypertension - BP initially elevated but has improved on Nitro drip. Most recent BP 116/72. - Will continue Lisinopril 5mg  daily and Toprol-XL 12.5mg  daily as above. Will hold home Amlodipine while [name] is on Nitro drip to avoid hypotension.  Hyperlipidemia - LDL 43 in 10/2017.  - Will recheck lipid panel.  - Continue Lipitor 80mg  daily.    Severity of Illness: The appropriate [name] status for this [name] is OBSERVATION. Observation status is judged to be reasonable and necessary in order to provide the required intensity of service to ensure the [name]'s safety. The [name]'s presenting symptoms, physical exam findings, and initial radiographic and laboratory data in the context of their medical condition is felt to place them at decreased risk for further clinical deterioration. Furthermore, it is anticipated that the [name] will be medically stable for discharge from the hospital within 2 midnights of  admission. The following factors support the [name] status of observation.   " The [name]'s presenting symptoms include abdominal/chest pain. " The physical exam findings as above. " The initial radiographic and laboratory data as above.     For questions or updates, please contact [place] Please consult [email or web address] for contact info under    Signed, [name], PA-C  11/07/2019 5:23 PM    [name] seen and examined. Agree with assessment and plan.  [name] is a 64 year old [name] is followed by Dr.  Barnoski.  He suffered an anterior STEMI and underwent emergent CABG revascularization surgery in 2017.  He has had recurrent episodes of chest pain with several subsequent cardiac catheterizations.  Last catheterization was in August 2020 which showed an occluded graft to his diagonal vessel.  He had a patent LIMA to his LAD, patent vein graft to his distal circumflex, and a vein graft to his RCA with 70% proximal stenosis.  He had a patent native proximal RCA stent with distal 50% stenosis.  He states he has been doing fairly well.  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  He did not feel very well.  He also noticed a sensation in the lower epigastric region which was different from his prior anginal symptomatology.  He ultimately presented to [facility] where they gave him 3 sublingual nitroglycerin and ultimately fentanyl for relief.  He did not have acute ST changes.  Troponins were negative.  However with his symptomatology he was transferred to [facility] for further evaluation and treatment.  Recently he denies any chest tightness.  He denies nausea or vomiting or diaphoresis.  He has been on heparin and IV nitroglycerin since med [facility] emergency room evaluation.  On exam presently blood  pressure is stable at 120/78.  Pulse is 70.  He has male pattern alopecia.  There is no JVD.  Lungs were clear.  He did not have chest wall discomfort to palpation.  Rhythm was regular with a faint 1/6 systolic murmur.  He did not have any abdominal tenderness.  Pulses were 2+.  There was no edema.  Neurologic exam was grossly nonfocal.  Hemoglobin 14.3 hematocrit 43.8.  BUN 19 creatinine 1.05.  D-dimer was minimally elevated at 1.13.  He underwent CT angio of his chest which was negative for PE.  No acute findings.  Aortic atherosclerosis was noted.  Will obtain serial enzymes follow-up ECG in a.m.  We will empirically increase Ranexa to 1000 mg twice a day particularly with his occluded graft and concomitant CAD.  He does not think the chest pain was similar to his previous MI pain.  He is on lovastatin for hyperlipidemia; will recheck lipid studies in a.m. he is to be on long-term DAPT with aspirin/Plavix.  The recommendations will be made following reassessment a.m.  Troy Sine, MD, [facility] 11/07/2019 6:49 PM

## 2019-11-07 NOTE — Telephone Encounter (Signed)
I spoke to the patient who called because at 6 am this morning, he experienced SOB and decided to check his BP 180/120 HR 160.  He took 1 Nitro and brought the BP to 122/84 HR still at 124.    He has developed a headache and now is having active CP with left arm weakness.  I advised him to go to the ED for further evaluation.  He verbalized understanding.

## 2019-11-07 NOTE — Telephone Encounter (Signed)
New Message  Pt c/o BP issue: STAT if pt c/o blurred vision, one-sided weakness or slurred speech  1. What are your last 5 BP readings? 184/116; 114; 160/100; 114; 150/100; 116  2. Are you having any other symptoms (ex. Dizziness, headache, blurred vision, passed out)? Feel like heart is racing; lightheadedness  3. What is your BP issue? Really high blood pressure

## 2019-11-07 NOTE — Progress Notes (Signed)
ANTICOAGULATION CONSULT NOTE - Initial Consult  Pharmacy Consult for heparin Indication: chest pain/ACS  Allergies  Allergen Reactions  . Prednisone Other (See Comments)    States that this med makes him "crazy"  . Tetanus Toxoids Swelling and Other (See Comments)    Fever, Swelling of the arm   . Wellbutrin [Bupropion] Other (See Comments)    Crazy thoughts, nightmares  . Morphine And Related Hives and Itching    Redness at the injection site  . Scallops [Shellfish Allergy] Nausea Only  . Chantix [Varenicline] Other (See Comments)    Dreams    Patient Measurements: Weight: 208 lb 1.6 oz (94.4 kg) Heparin Dosing Weight: 90kg  Vital Signs: Temp: 98.8 F (37.1 C) (03/10 0954) Temp Source: Oral (03/10 0954) BP: 155/91 (03/10 0954) Pulse Rate: 102 (03/10 0954)  Labs: Recent Labs    11/07/19 0958 11/07/19 1006 11/07/19 1150  HGB  --  14.3  --   HCT  --  43.8  --   PLT  --  213  --   CREATININE  --  1.05  --   TROPONINIHS <2  --  2    Estimated Creatinine Clearance: 79.2 mL/min (by C-G formula based on SCr of 1.05 mg/dL).   Medical History: Past Medical History:  Diagnosis Date  . Anxiety   . Arthritis   . Basal cell carcinoma (BCC) of forehead   . CAD (coronary artery disease)    a. 10/2015 ant STEMI >> LHC with 3 v CAD; oLAD tx with POBA >> emergent CABG. b. Multiple evals since that time, early graft failure of SVG-RCA by cath 03/2016. c. 2/19 PCI/DES x1 to pRCA, normal EF.  . Carotid artery disease ([place])    a. 40-59% BICA 02/2018.  Marland Kitchen Depression   . Dyspnea   . Ectopic atrial tachycardia ([place])   . Esophageal reflux    eosinophil esophagitis  . Family history of adverse reaction to anesthesia    "sister has PONV" (06/21/2017)  . Former tobacco use   . Gout   . Hepatitis C    "treated and cured" (06/21/2017)  . High cholesterol   . History of kidney stones   . Hypertension   . Ischemic cardiomyopathy    a. EF 25-30% at intraop TEE 4/17  //  b. Limited  Echo 5/17 - EF 45-50%, mild ant HK. c. EF 55-65% by cath 09/2017.  . Migraine    "3-4/yr" (06/21/2017)  . Myocardial infarction ([place]) 10/2015  . Palpitations   . Sinus bradycardia    a. HR dropping into 40s in 02/2016 -> BB reduced.  . Stroke ([place]) 10/2016   "small one; sometimes my memory/cognitive issues" (06/21/2017)  . Symptomatic hypotension    a. 02/2016 ER visit -> meds reduced.  . Syncope    Assessment: 33 YOM presenting with CP, hx of ACS, not on anticoagulation PTA, CBC wnl.    Goal of Therapy:  Heparin level 0.3-0.7 units/ml Monitor platelets by anticoagulation protocol: Yes   Plan:  Heparin 4000 units IV x 1, and gtt at 1100 units/hr F/u 6 hour heparin level  Bertis Ruddy, PharmD Clinical Pharmacist ED Pharmacist Phone # [phone] 11/07/2019 12:44 PM

## 2019-11-07 NOTE — ED Provider Notes (Signed)
[facility] EMERGENCY DEPARTMENT Provider Note   CSN: [id number] Arrival date & time: 11/07/19  6384    History Chief Complaint  Patient presents with  . Chest Pain    [name] is a 64 y.o. male with past medical history significant for anxiety, CAD, hep C, MI, ischemic cardiomyopathy presents for evaluation of chest pain.  Patient states woke up at 6 AM this morning.  States he got out of the shower around 7 AM and developed shortness of breath.  Patient states upon walking out of bathroom he developed substernal chest pain.  Patient states the pain made his left arm feel "off and weird."  Denies any weakness or numbness to his left arm, HA.  He took nitro which helped bring down his pain from a 7 to a 4.  He admits to active chest pain here in the emergency department which she rates a 3-4 currently.  Patient states he did take his blood pressure which was elevated at home.  He took 2 nitro pTA.  Patient states his heart rate was 160 at that time.  On rechecking his blood pressure had returned to systolic 665 however was still tachycardic to 140.  Patient states he did have a mild headache after he took the nitro which has resolved. No sudden onset thunderclap HA. Patient states upon getting chest pain or shortness of breath he did feel like he got lightheaded, nauseous without any emesis.  Does have history of prior MI and is unable to tell me if this felt similar.  Chest pain is exertional and pleuritic in nature.  No recent surgery or immobilization.  No prior history of PEs or DVTs.  Denies unilateral weakness, paresthesias, abdominal pain, diarrhea, dysuria.  Denies additional aggravating or alleviating factors.  Patient states he does have a history of anxiety however states this does not feel like prior anxiety attacks.  Pain located to epigastric, substernal, left chest and left lateral chest wall and into left arm. No pain radiating into neck or jaw.  History pain from  patient past medical records.  No interpreter is used.   HPI     Past Medical History:  Diagnosis Date  . Anxiety   . Arthritis   . Basal cell carcinoma (BCC) of forehead   . CAD (coronary artery disease)    a. 10/2015 ant STEMI >> LHC with 3 v CAD; oLAD tx with POBA >> emergent CABG. b. Multiple evals since that time, early graft failure of SVG-RCA by cath 03/2016. c. 2/19 PCI/DES x1 to pRCA, normal EF.  . Carotid artery disease ([place])    a. 40-59% BICA 02/2018.  Marland Kitchen Depression   . Dyspnea   . Ectopic atrial tachycardia ([place])   . Esophageal reflux    eosinophil esophagitis  . Family history of adverse reaction to anesthesia    "sister has PONV" (06/21/2017)  . Former tobacco use   . Gout   . Hepatitis C    "treated and cured" (06/21/2017)  . High cholesterol   . History of kidney stones   . Hypertension   . Ischemic cardiomyopathy    a. EF 25-30% at intraop TEE 4/17  //  b. Limited Echo 5/17 - EF 45-50%, mild ant HK. c. EF 55-65% by cath 09/2017.  . Migraine    "3-4/yr" (06/21/2017)  . Myocardial infarction ([place]) 10/2015  . Palpitations   . Sinus bradycardia    a. HR dropping into 40s in 02/2016 -> BB reduced.  Marland Kitchen  Stroke [facility]) 10/2016   "small one; sometimes my memory/cognitive issues" (06/21/2017)  . Symptomatic hypotension    a. 02/2016 ER visit -> meds reduced.  . Syncope     Patient Active Problem List   Diagnosis Date Noted  . Gastroesophageal reflux disease   . Cervical radiculopathy 10/17/2018  . Preoperative clearance 10/04/2018  . Palpitations   . Coronary artery disease involving coronary bypass graft of native heart with angina pectoris ([place])   . Transient loss of consciousness 03/24/2018  . Ectopic atrial tachycardia ([place]) 02/09/2018  . Central chest pain 03/10/2017  . Family hx-stroke 11/10/2016  . Stroke-like episode ([place]) - R brain, s/p tPA 11/09/2016  . Unstable angina ([place]) 09/07/2016  . Claudication of both lower extremities ([place])   . Pure  hypercholesterolemia   . Tobacco abuse disorder   . CAD of autologous artery bypass graft without angina   . Chest pain at rest 06/10/2016  . Abnormal nuclear stress test - HIGH RISK 04/20/2016  . Old MI (myocardial infarction)   . Essential hypertension 02/26/2016  . Ischemic cardiomyopathy 12/25/2015  . Hyperlipidemia LDL goal <70 12/25/2015  . 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 Coronary artery disease involving native coronary artery of native heart with unstable angina pectoris ([place]) 11/28/2015  . 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 Acute MI anterior wall first episode care [facility])   . Precordial chest pain 03/07/2015  . Dysphagia 03/07/2015  . Gout attack 03/07/2015  . Mixed bipolar I disorder ([place]) 03/07/2015  . Fibromyalgia 07/09/2014  . Gout 07/09/2014  . Anxiety 07/09/2014  . Depression 07/09/2014  . Hepatitis C 11/20/2012  . Eosinophilic esophagitis 64/33/2951    Past Surgical History:  Procedure Laterality Date  . ANTERIOR CERVICAL DECOMP/DISCECTOMY FUSION N/A 10/17/2018   Procedure: Anterior Cervical Decompression Fusion - Cervical seven -Thoracic one;  Surgeon: Consuella Lose, MD;  Location: [facility];  Service: Neurosurgery;  Laterality: N/A;  . BASAL CELL CARCINOMA EXCISION     "forehead  . BIOPSY  07/20/2019   Procedure: BIOPSY;  Surgeon: Carol Ada, MD;  Location: [facility];  Service: Endoscopy;;  . CARDIAC CATHETERIZATION N/A 11/28/2015   Procedure: Left Heart Cath and Coronary Angiography;  Surgeon: [name], MD;  Location: [place] CV LAB;  Service: Cardiovascular;  Laterality: N/A;  . CARDIAC CATHETERIZATION N/A 11/28/2015   Procedure: Coronary Balloon Angioplasty;  Surgeon: [name], MD;  Location: [place] CV LAB;  Service: Cardiovascular;  Laterality: N/A;  ostial LAD  . CARDIAC CATHETERIZATION N/A 11/28/2015   Procedure: Coronary/Graft Angiography;  Surgeon: [name], MD;  Location: [place] CV LAB;   Service: Cardiovascular;  Laterality: N/A;  coronaries only   . CARDIAC CATHETERIZATION N/A 04/21/2016   Procedure: Left Heart Cath and Coronary Angiography;  Surgeon: Wellington Hampshire, MD;  Location: [place] CV LAB;  Service: Cardiovascular;  Laterality: N/A;  . CARDIAC CATHETERIZATION N/A 06/14/2016   Procedure: Left Heart Cath and Cors/Grafts Angiography;  Surgeon: Lorretta Harp, MD;  Location: [place] CV LAB;  Service: Cardiovascular;  Laterality: N/A;  . CARDIAC CATHETERIZATION N/A 09/08/2016   Procedure: Left Heart Cath and Cors/Grafts Angiography;  Surgeon: Wellington Hampshire, MD;  Location: [place] CV LAB;  Service: Cardiovascular;  Laterality: N/A;  . CARDIAC CATHETERIZATION    . CORONARY ARTERY BYPASS GRAFT N/A 11/28/2015   Procedure: CORONARY ARTERY BYPASS GRAFTING (CABG) TIMES FIVE USING LEFT INTERNAL MAMMARY ARTERY AND RIGHT GREATER SAPHENOUS,VIEN HARVEATED BY ENDOVIEN,  INTRAOPPRATIVE TEE;  Surgeon: Gaye Pollack, MD;  Location: [facility];  Service: Open Heart Surgery;  Laterality: N/A;  . CORONARY STENT INTERVENTION N/A 10/05/2017   Procedure: CORONARY STENT INTERVENTION;  Surgeon: [name], MD;  Location: [place] CV LAB;  Service: Cardiovascular;  Laterality: N/A;  . ESOPHAGOGASTRODUODENOSCOPY (EGD) WITH PROPOFOL N/A 07/20/2019   Procedure: ESOPHAGOGASTRODUODENOSCOPY (EGD) WITH PROPOFOL;  Surgeon: Carol Ada, MD;  Location[facility];  Service: Endoscopy;  Laterality: N/A;  . HUMERUS SURGERY Right 1969   "tumor inside bone; filled it w/bone chips"  . LEFT HEART CATH AND CORS/GRAFTS ANGIOGRAPHY N/A 03/11/2017   Procedure: Left Heart Cath and Cors/Grafts Angiography;  Surgeon: Leonie Man, MD;  Location: [place] CV LAB;  Service: Cardiovascular;  Laterality: N/A;  . LEFT HEART CATH AND CORS/GRAFTS ANGIOGRAPHY N/A 10/05/2017   Procedure: LEFT HEART CATH AND CORS/GRAFTS ANGIOGRAPHY;  Surgeon: [name], MD;  Location: [place] CV LAB;   Service: Cardiovascular;  Laterality: N/A;  . LEFT HEART CATH AND CORS/GRAFTS ANGIOGRAPHY N/A 04/11/2019   Procedure: LEFT HEART CATH AND CORS/GRAFTS ANGIOGRAPHY;  Surgeon: [name], MD;  Location: [place] CV LAB;  Service: Cardiovascular;  Laterality: N/A;  . PERIPHERAL VASCULAR CATHETERIZATION N/A 06/14/2016   Procedure: Lower Extremity Angiography;  Surgeon: Lorretta Harp, MD;  Location: [facility] CV LAB;  Service: Cardiovascular;  Laterality: N/A;      Family History  Problem Relation Age of Onset  . Lung cancer Mother   . Heart Problems Father   . Heart attack Father 8  . Stroke Father   . Heart failure Father   . Heart attack Maternal Grandmother   . Stroke Maternal Grandmother   . Heart attack Paternal Uncle   . Hypertension Brother   . Autoimmune disease Neg Hx     Social History   Tobacco Use  . Smoking status: Former Smoker    Packs/day: 0.75    Years: 44.00    Pack years: 33.00    Types: Cigarettes    Quit date: 11/28/2015    Years since quitting: 3.9  . Smokeless tobacco: Never Used  Substance Use Topics  . Alcohol use: Yes    Comment: occ  . Drug use: No    Comment: 06/21/2017 "nothing since the 1980s"    Home Medications Prior to Admission medications   Medication Sig Start Date End Date Taking? Authorizing Provider  amLODipine (NORVASC) 10 MG tablet Take 1 tablet (10 mg total) by mouth daily. 03/07/19  Yes [name], MD  aspirin 81 MG tablet Take 1 tablet (81 mg total) by mouth daily. 10/24/18  Yes Costella, Vista Mink, PA-C  atorvastatin (LIPITOR) 80 MG tablet TAKE 1 TABLET(80 MG) BY MOUTH DAILY 09/21/18  Yes [name], MD  clopidogrel (PLAVIX) 75 MG tablet Take 1 tablet (75 mg total) by mouth daily. 10/24/18  Yes Costella, Vista Mink, PA-C  lisinopril (ZESTRIL) 5 MG tablet Take 5 mg by mouth daily. May take 1 additional tablet once daily for SBP greater than 150 once hour after taking daily dose 03/12/19  Yes [name], MD  metoprolol succinate (TOPROL-XL) 25 MG 24 hr tablet Take 0.5 tablets (12.5 mg total) by mouth daily. Take with or immediately following a meal. 01/23/19  Yes [name], MD  Multiple Vitamins-Minerals (ONE-A-DAY MENS 50+ ADVANTAGE PO) Take 1 tablet by mouth daily.    Yes [provider]  nitroGLYCERIN (NITROSTAT) 0.4 MG SL tablet PLACE  1 TABLET UNDER THE TONGUE EVERY 5 MINUTES AS NEEDED FOR CHEST PAIN. 3 DOSES MAX 08/06/19  Yes [name], MD  pantoprazole (PROTONIX) 40 MG tablet Take 40 mg by mouth daily.   Yes [provider]  ranolazine (RANEXA) 1000 MG SR tablet TAKE ONE TABLET BY MOUTH TWICE DAILY Patient taking differently: Take 1,000 mg by mouth daily.  03/08/19  Yes Weaver, Scott T, PA-C  acetaminophen (TYLENOL) 500 MG tablet Take 1,000 mg by mouth every 6 (six) hours as needed for mild pain or headache.     [provider]  chlordiazePOXIDE (LIBRIUM) 10 MG capsule Take 10-20 mg by mouth 3 (three) times daily as needed for anxiety.     [provider]  Doxepin HCl 6 MG TABS Take 6 mg by mouth at bedtime as needed for sleep. 02/03/19   [provider]    Allergies    Prednisone, Tetanus toxoids, Wellbutrin [bupropion], Morphine and related, Scallops [shellfish allergy], and Chantix [varenicline]  Review of Systems   Review of Systems  Constitutional: Positive for diaphoresis and fatigue.  HENT: Negative.   Respiratory: Positive for shortness of breath.   Cardiovascular: Positive for chest pain and palpitations.  Gastrointestinal: Positive for nausea.  Genitourinary: Negative.   Musculoskeletal: Negative.   Skin: Negative.   Neurological: Positive for light-headedness. Negative for tremors, facial asymmetry, speech difficulty, weakness, numbness and headaches.  All other systems reviewed and are negative.   Physical Exam Updated Vital Signs BP (!) 155/91 (BP Location: Right Arm)   Pulse (!) 102   Temp  98.8 F (37.1 C) (Oral)   Resp 16   Wt 94.4 kg   SpO2 97%   BMI 31.64 kg/m   Physical Exam Vitals and nursing note reviewed.  Constitutional:      General: He is not in acute distress.    Appearance: He is well-developed. He is not ill-appearing, toxic-appearing or diaphoretic.  HENT:     Head: Atraumatic.  Eyes:     Pupils: Pupils are equal, round, and reactive to light.  Neck:     Comments: Full active and passive ROM without pain No midline or paraspinal tenderness No nuchal rigidity or meningeal signs  Cardiovascular:     Rate and Rhythm: Normal rate and regular rhythm.     Pulses:          Radial pulses are 2+ on the right side and 2+ on the left side.  Pulmonary:     Effort: Pulmonary effort is normal. No respiratory distress.  Abdominal:     General: There is no distension.     Palpations: Abdomen is soft.  Musculoskeletal:        General: Normal range of motion.     Cervical back: Normal range of motion and neck supple.  Skin:    General: Skin is warm and dry.  Neurological:     Mental Status: He is alert.     Comments: Mental Status:  Alert, oriented, thought content appropriate. Speech fluent without evidence of aphasia. Able to follow 2 step commands without difficulty.  Cranial Nerves:  II:  Peripheral visual fields grossly normal, pupils equal, round, reactive to light III,IV, VI: ptosis not present, extra-ocular motions intact bilaterally  V,VII: smile symmetric, facial light touch sensation equal VIII: hearing grossly normal bilaterally  IX,X: midline uvula rise  XI: bilateral shoulder shrug equal and strong XII: midline tongue extension  Motor:  5/5 in upper and lower extremities bilaterally including strong and  equal grip strength and dorsiflexion/plantar flexion Sensory: Pinprick and light touch normal in all extremities.  Deep Tendon Reflexes: 2+ and symmetric  Cerebellar: normal finger-to-nose with bilateral upper extremities Gait: normal gait  and balance CV: distal pulses palpable throughout      ED Results / Procedures / Treatments   Labs (all labs ordered are listed, but only abnormal results are displayed) Labs Reviewed  BASIC METABOLIC PANEL - Abnormal; Notable for the following components:      Result Value   Glucose, Bld 104 (*)    All other components within normal limits  D-DIMER, QUANTITATIVE (NOT AT [facility]) - Abnormal; Notable for the following components:   D-Dimer, Quant 1.13 (*)    All other components within normal limits  RESPIRATORY PANEL BY RT PCR (FLU A&B, COVID)  CBC WITH DIFFERENTIAL/PLATELET  TROPONIN I (HIGH SENSITIVITY)  TROPONIN I (HIGH SENSITIVITY)    EKG EKG Interpretation  Date/Time:  Wednesday November 07 2019 09:53:11 EST Ventricular Rate:  82 PR Interval:    QRS Duration: 96 QT Interval:  357 QTC Calculation: 417 R Axis:   85 Text Interpretation: Sinus rhythm Borderline right axis deviation No significant change since last tracing Confirmed by Isla Pence [phone]) on 11/07/2019 9:57:05 AM   Radiology DG Chest 2 View  Result Date: 11/07/2019 CLINICAL DATA:  Chest pain EXAM: CHEST - 2 VIEW COMPARISON:  04/11/2019 FINDINGS: Patchy density at the lung bases. No pleural effusion or pneumothorax. Cardiomediastinal contours are within normal limits. No acute osseous abnormality. IMPRESSION: Patchy atelectasis/consolidation at the lung bases. Electronically Signed   By: Macy Mis M.D.   On: 11/07/2019 10:34   CT Angio Chest PE W and/or Wo Contrast  Result Date: 11/07/2019 CLINICAL DATA:  Shortness of breath EXAM: CT ANGIOGRAPHY CHEST WITH CONTRAST TECHNIQUE: Multidetector CT imaging of the chest was performed using the standard protocol during bolus administration of intravenous contrast. Multiplanar CT image reconstructions and MIPs were obtained to evaluate the vascular anatomy. CONTRAST:  17mL OMNIPAQUE IOHEXOL 350 MG/ML SOLN COMPARISON:  04/11/2019 FINDINGS: Cardiovascular:  Satisfactory opacification of the pulmonary arteries to the segmental level. No evidence of pulmonary embolism. Normal heart size. No pericardial effusion. Evidence of prior CABG. Calcified plaque is present the thoracic aorta. Mediastinum/Nodes: No mediastinal, hilar, or axillary adenopathy. Thyroid and esophagus are unremarkable. Lungs/Pleura: No pleural effusion or pneumothorax. No consolidation or mass. Upper Abdomen: No acute abnormality. Musculoskeletal: Anterior fusion at C7-T1. No acute osseous abnormality. Review of the MIP images confirms the above findings. IMPRESSION: No evidence of acute pulmonary embolism. No additional acute finding. Aortic Atherosclerosis (ICD10-I70.0). Electronically Signed   By: Macy Mis M.D.   On: 11/07/2019 11:05   Left Heart cath 03/2019-  Mid LAD lesion is 55% stenosed.  Ost LAD to Prox LAD lesion is 50% stenosed. LIMA to LAD is patent.  Ost Ramus lesion is 90% stenosed.  Ramus lesion is 75% stenosed.  Ost Cx lesion is 80% stenosed.  Prox Cx to Mid Cx lesion is 50% stenosed. SVG to OM is patent.  Dist RCA lesion is 50% stenosed.  RPDA lesion is 40% stenosed. SVG to PDA is patent with proximal disease that is unchanged.  Previously placed Ost RCA to Prox RCA drug eluting stent is widely patent.  Graft to the diagonal was occluded. There was TIMI 3 flow in the small diagonal with proximal ectasia.  The left ventricular systolic function is normal.  LV end diastolic pressure is normal. LVEDP 11 mm Hg.  The left  ventricular ejection fraction is 50-55% by visual estimate.  There is no aortic valve stenosis.  Procedures .Critical Care Performed by: Nettie Elm, PA-C Authorized by: Nettie Elm, PA-C   Critical care provider statement:    Critical care time (minutes):  45   Critical care was necessary to treat or prevent imminent or life-threatening deterioration of the following conditions:  Cardiac failure   Critical care was  time spent personally by me on the following activities:  Discussions with consultants, evaluation of patient's response to treatment, examination of patient, ordering and performing treatments and interventions, ordering and review of laboratory studies, ordering and review of radiographic studies, pulse oximetry, re-evaluation of patient's condition, obtaining history from patient or surrogate and review of old charts   (including critical care time)  Medications Ordered in ED Medications  nitroGLYCERIN (NITROSTAT) SL tablet 0.4 mg (0.4 mg Sublingual Given 11/07/19 1124)  nitroGLYCERIN 50 mg in dextrose 5 % 250 mL (0.2 mg/mL) infusion (5 mcg/min Intravenous New Bag/Given 11/07/19 1308)  heparin ADULT infusion 100 units/mL (25000 units/24mL sodium chloride 0.45%) (1,100 Units/hr Intravenous New Bag/Given 11/07/19 1311)  aspirin chewable tablet 324 mg (324 mg Oral Given 11/07/19 1012)  iohexol (OMNIPAQUE) 350 MG/ML injection 100 mL (100 mLs Intravenous Contrast Given 11/07/19 1041)  fentaNYL (SUBLIMAZE) injection 50 mcg (50 mcg Intravenous Given 11/07/19 1155)  ondansetron (ZOFRAN) injection 4 mg (4 mg Intravenous Given 11/07/19 1155)  heparin bolus via infusion 4,000 Units (4,000 Units Intravenous Bolus from Bag 11/07/19 1311)    ED Course  I have reviewed the triage vital signs and the nursing notes.  Pertinent labs & imaging results that were available during my care of the patient were reviewed by me and considered in my medical decision making (see chart for details).  64 year old significant cardiac history during CABG, multiple PCI's presents for ration of chest pain which began this morning.  Patient with associated diaphoresis, nausea, lightheadedness, radiation into left arm with associated shortness of breath.  Pain is exertional and pleuritic in nature.  No recent surgery, mobilization, history of PE or DVT.  He is mildly tachycardic on exam.  Took nitro at home with improvement in his pain  however still has chest pain which she rates a 4.  No radiation into his back.  States his left arm feels "weird" however no paresthesias or weakness.  Did have HA after Nitro however NV intact without deficits.Low suspicion for CVA, dissection.  Labs and imaging personally interpreted: DDimer elevated, Given pleuritic pain will order CTA to r/o PE CBC without leukocytosis EKG similar to previous Trop negative BMP WO significant findings. CTA negative for PE  11:19: Patient with continued CP in ED rated a 3/10. Endorses some mild SOB. Will give additional Nitro. Plan to consult cards after delta trop. No abd pain to suggest atypical abd pain, gastritis as cause of pain. Low suspicion for PA, dissection.  1226: Delta trop negative. Pain down to 1-2 after Fentanyl, Nitro x 3. Will consult with Cardiology. Heart score 6.  CONSULT with Dr. Claiborne Billings with Cardiology. Recommends Transfer to [facility], heparin and Nitro drip. COVID test for likely Cath.  The patient appears reasonably stabilized for admission considering the current resources, flow, and capabilities available in the ED at this time, and I doubt any other [facility] requiring further screening and/or treatment in the ED prior to admission.     MDM Rules/Calculators/A&P  Final Clinical Impression(s) / ED Diagnoses Final diagnoses:  Unstable angina [facility])    Rx / DC Orders ED Discharge Orders    None       Traci Gafford A, PA-C 11/07/19 1412    Isla Pence, MD 11/08/19 [phone]

## 2019-11-08 DIAGNOSIS — R079 Chest pain, unspecified: Secondary | ICD-10-CM | POA: Diagnosis not present

## 2019-11-08 DIAGNOSIS — I10 Essential (primary) hypertension: Secondary | ICD-10-CM | POA: Diagnosis not present

## 2019-11-08 DIAGNOSIS — I25119 Atherosclerotic heart disease of native coronary artery with unspecified angina pectoris: Secondary | ICD-10-CM | POA: Diagnosis not present

## 2019-11-08 LAB — BASIC METABOLIC PANEL
Anion gap: 10 (ref 5–15)
BUN: 14 mg/dL (ref 8–23)
CO2: 26 mmol/L (ref 22–32)
Calcium: 9.3 mg/dL (ref 8.9–10.3)
Chloride: 102 mmol/L (ref 98–111)
Creatinine, Ser: 1.14 mg/dL (ref 0.61–1.24)
GFR calc Af Amer: >60 mL/min (ref >60)
GFR calc non Af Amer: >60 mL/min (ref >60)
Glucose, Bld: 102 mg/dL — ABNORMAL HIGH (ref 70–99)
Potassium: 4 mmol/L (ref 3.5–5.1)
Sodium: 138 mmol/L (ref 135–145)

## 2019-11-08 LAB — BRAIN NATRIURETIC PEPTIDE: B Natriuretic Peptide: 26.5 pg/mL (ref 0.0–100.0)

## 2019-11-08 LAB — LIPID PANEL
Cholesterol: 160 mg/dL (ref 0–200)
HDL: 40 mg/dL — ABNORMAL LOW (ref >40)
LDL Cholesterol: 89 mg/dL (ref 0–99)
Total CHOL/HDL Ratio: 4 RATIO
Triglycerides: 153 mg/dL — ABNORMAL HIGH (ref <150)
VLDL: 31 mg/dL (ref 0–40)

## 2019-11-08 LAB — CBC
HCT: 42.6 % (ref 39.0–52.0)
Hemoglobin: 14 g/dL (ref 13.0–17.0)
MCH: 30.6 pg (ref 26.0–34.0)
MCHC: 32.9 g/dL (ref 30.0–36.0)
MCV: 93 fL (ref 80.0–100.0)
Platelets: 206 10*3/uL (ref 150–400)
RBC: 4.58 MIL/uL (ref 4.22–5.81)
RDW: 13.2 % (ref 11.5–15.5)
WBC: 8.7 10*3/uL (ref 4.0–10.5)
nRBC: 0 % (ref 0.0–0.2)

## 2019-11-08 LAB — HEPARIN LEVEL (UNFRACTIONATED): Heparin Unfractionated: 0.79 IU/mL — ABNORMAL HIGH (ref 0.30–0.70)

## 2019-11-08 LAB — HIV ANTIBODY (ROUTINE TESTING W REFLEX): HIV Screen 4th Generation wRfx: NONREACTIVE

## 2019-11-08 MED ORDER — ALUM & MAG HYDROXIDE-SIMETH 200-200-20 MG/5ML PO SUSP
30.0000 mL | Freq: Four times a day (QID) | ORAL | Status: DC | PRN
  Administered 2019-11-08 (×2): 30 mL via ORAL
  Filled 2019-11-08 (×2): qty 30

## 2019-11-08 MED ORDER — RANOLAZINE ER 1000 MG PO TB12: 1000.0000 mg | ORAL_TABLET | Freq: Two times a day (BID) | ORAL | 0 refills | Status: DC

## 2019-11-08 MED ORDER — AMLODIPINE BESYLATE 10 MG PO TABS
10.0000 mg | ORAL_TABLET | Freq: Every day | ORAL | Status: DC
  Administered 2019-11-08: 10 mg via ORAL
  Filled 2019-11-08: qty 1

## 2019-11-08 NOTE — Discharge Summary (Signed)
Discharge Summary    Patient ID: [name],  MRN: [id number], DOB/AGE: [birth year] 64 y.o.  Admit date: 11/07/2019 Discharge date: 11/08/2019  Primary Care Provider: [name] Primary Cardiologist: Larae Grooms, MD  Discharge Diagnoses    Active Problems:   Coronary artery disease involving native coronary artery of native heart with angina pectoris [facility])   Unstable angina (HCC)   Chest pain   Allergies Allergies  Allergen Reactions  . Prednisone Other (See Comments)    States that this med makes him "crazy"  . Tetanus Toxoids Swelling and Other (See Comments)    Fever, Swelling of the arm   . Wellbutrin [Bupropion] Other (See Comments)    Crazy thoughts, nightmares  . Morphine And Related Hives and Itching    Redness at the injection site  . Scallops [Shellfish Allergy] Nausea Only  . Chantix [Varenicline] Other (See Comments)    Dreams    Diagnostic Studies/Procedures    N/a  _____________   History of Present Illness     [name] is a 64 year old male with the above history who is followed by Dr. Irish Lack. Patient had an anterior STEMI and emergent CABG in 2017. Since then, he has had multiple episodes of chest pain and multiple cardiac catheterizations in 2018 not resulting in PCI. He admits to having some anxiety as well. In 09/2017, he had a cardiac catheterization with PCI to RCA due to narrowing of the graft to the distal RCA. Patient presented to the ED in 05/2018 with palpitations and heart rate up to 120 but normal when he arrived. Also had atypical chest pain, anxiety was felt to be a component and he requested narcotics for relief. Event monitor 06/13/2018 normal sinus rhythm with occasional sinus bradycardia and sinus tachycardia. No atrial fibrillation or pathologic arrhythmias. Patient presented back to the ED in 08/2018 for atypical chest pain that was reproducible. Echo showed LVEF of 55-60% with no valvular problems. Chest CTA showed no  evidence of aortic dissection and mild prominence of the ascending aorta at 3.9 x 3.9 cm. He was admitted again in 03/2019 with chest pain. He underwent cardiac catheterization which showed stable and continued medical therapy was recommended. Chest pain was felt to be GI in nature.   Patient called our office the morning of admission and reported shortness of breath around 6am. BP elevated at 180/120 and heart rate elevated at 160 bpm at that time. He took 1 sublingual Nitro and BP improved to 122/84 but heart rate still elevated at 124 bpm. At time of call, he was having active chest pain with left arm pain. He was instructed to go to the ED for further evaluation which he did.   Upon arrival to the ED, patient hypertensive with BP of 155/91 but vitals stable. EKG showed normal sinus rhythm but no acute ischemic changes. Chest x-ray showed patchy atelectasis/consolidation at the lung bases. High-sensitivity troponin negative x2. D-dimer elevated at 1.13. Chest CTA negative for PE. WBC 7.2, Hgb 14.3, Plts 213. Na 138, K 4.6, Glucose 104, BUN 19, Cr 1.05. COVID-19 negative.   At the time of this evaluation, patient resting comfortably in bed. He stated he woke up about 6 AM and just did not feel right and he did not have an appetite.  He went to get a shower when he got out of the shower he felt very short of breath like he had just finished exercising.  He denied any chest pain or palpitations  at that time.  However he decided to check his blood pressure.  BP was elevated in the 180s/120s at that time and heart rate was elevated in the 140s to 150s.  He took a Nitro at that time with no significant improvement in shortness of breath.  His BP did improve some but his heart rate remained elevated.  Again, he denied any chest pain at this time.  However, while in the ED around 10 to 11 AM, he developed upper abdominal/lower chest discomfort more towards the left side.  He described this discomfort as a  tightness.  He described pleuritic pain in the ED. Nothing seemed to make it better or worse. He was given fentanyl in the ED and stated pain resolved for about an hour and a half and then returned.  He currently reports 4 out of 10 upper abdominal pain.  All of the symptoms came on very suddenly.  No recent fevers or illnesses.  No abnormal bleeding on dual antiplatelet therapy.  He stated this discomfort was somewhat similar to prior cardiac pain but much less severe. He initially had a headache after sublingual Nitro but states this has resolved. He was transferred from [facility] to [facility] for further management.  Hospital Course     1. Chest Pain with Known CAD: He has a history of anterior STEMI and emergent CABG in 2017 with multiple subsequent cardiac catheterizations and PCI to RCA in 09/2017. EKG showed no acute ischemic changes. Was placed on IV nitro and heparin drip while in the ED. High-sensitivity troponin negative 2. He did have another episode of chest burning, but reported this felt like GERD. Suspect chest discomfort is likely GI related. Will continue on daily protonix, discussed added pepcid PRN. Can defer to his GI MD for further management.   - Continue dual antiplatelel therapy with Aspirin and Plavix. Continue Toprol-XL 12.5mg  daily. His Ranexa was increased to 1000mg  twice daily. Continue Lipitor 80mg  daily. Continue Protonix.  2. Ischemic Cardiomyopathy: LVEF of 55-60% on Echo in 08/2018.  - Patient remained euvolemic and BNP not elevated (26) - No need for diuretics at this time. - Continue Lisinopril 5mg  daily and Toprol-XL 12.5mg  daily.  3. Bilateral Carotid Artery Disease - Carotid ultrasounds in 02/2018 showed 40-59% stenosis of bilateral ICAs.  - Continue Aspirin 81mg  daily, Plavix 75mg  daily, Lipitor 80mg  daily. - Needs repeat dopplers done as an outpatient  4. Hypertension: BP initially elevated but improved during admission. Lisinopril 5mg  daily, Toprol-XL 12.5mg  daily  and Amlodipine 10mg .  5. Hyperlipidemia: LDL 43 in 10/2017. LDL 89 this admission. Reports dietary indiscretion recently.   - Continue Lipitor 80mg  daily. He will work on improving his diet.   General: Well developed, well nourished, male appearing in no acute distress. Head: Normocephalic, atraumatic.  Neck: Supple without bruits, JVD. Lungs:  Resp regular and unlabored, CTA. Heart: RRR, S1, S2, no S3, S4, or murmur; no rub. Abdomen: Soft, non-tender, non-distended with normoactive bowel sounds. No hepatomegaly. No rebound/guarding. No obvious abdominal masses. Extremities: No clubbing, cyanosis, edema. Distal pedal pulses are 2+ bilaterally.  Neuro: Alert and oriented X 3. Moves all extremities spontaneously. Psych: Normal affect.  [name] was seen by Dr. Claiborne Billings and determined stable for discharge home. Follow up in the office has been arranged. Medications are listed below.   _____________  Discharge Vitals Blood pressure 133/83, pulse 66, temperature 98.5 F (36.9 C), temperature source Oral, resp. rate 14, weight 92.5 kg, SpO2 98 %.  Filed Weights  11/07/19 0949 11/08/19 0446  Weight: 94.4 kg 92.5 kg    Labs & Radiologic Studies    CBC Recent Labs    11/07/19 1006 11/08/19 0432  WBC 7.2 8.7  NEUTROABS 5.1  --   HGB 14.3 14.0  HCT 43.8 42.6  MCV 92.4 93.0  PLT 213 876   Basic Metabolic Panel Recent Labs    11/07/19 1006 11/08/19 0432  NA 138 138  K 4.6 4.0  CL 103 102  CO2 25 26  GLUCOSE 104* 102*  BUN 19 14  CREATININE 1.05 1.14  CALCIUM 9.7 9.3   Liver Function Tests Recent Labs    11/07/19 2307  AST 32  ALT 40  ALKPHOS 45  BILITOT 1.3*  PROT 6.7  ALBUMIN 3.9   Recent Labs    11/07/19 2307  LIPASE 23   Cardiac Enzymes No results for input(s): CKTOTAL, CKMB, CKMBINDEX, TROPONINI in the last 72 hours. BNP Invalid input(s): POCBNP D-Dimer Recent Labs    11/07/19 1006  DDIMER 1.13*   Hemoglobin A1C No results for input(s):  HGBA1C in the last 72 hours. Fasting Lipid Panel Recent Labs    11/08/19 0432  CHOL 160  HDL 40*  LDLCALC 89  TRIG 153*  CHOLHDL 4.0   Thyroid Function Tests No results for input(s): TSH, T4TOTAL, T3FREE, THYROIDAB in the last 72 hours.  Invalid input(s): FREET3 _____________  DG Chest 2 View  Result Date: 11/07/2019 CLINICAL DATA:  Chest pain EXAM: CHEST - 2 VIEW COMPARISON:  04/11/2019 FINDINGS: Patchy density at the lung bases. No pleural effusion or pneumothorax. Cardiomediastinal contours are within normal limits. No acute osseous abnormality. IMPRESSION: Patchy atelectasis/consolidation at the lung bases. Electronically Signed   By: Macy Mis M.D.   On: 11/07/2019 10:34   CT Angio Chest PE W and/or Wo Contrast  Result Date: 11/07/2019 CLINICAL DATA:  Shortness of breath EXAM: CT ANGIOGRAPHY CHEST WITH CONTRAST TECHNIQUE: Multidetector CT imaging of the chest was performed using the standard protocol during bolus administration of intravenous contrast. Multiplanar CT image reconstructions and MIPs were obtained to evaluate the vascular anatomy. CONTRAST:  157mL OMNIPAQUE IOHEXOL 350 MG/ML SOLN COMPARISON:  04/11/2019 FINDINGS: Cardiovascular: Satisfactory opacification of the pulmonary arteries to the segmental level. No evidence of pulmonary embolism. Normal heart size. No pericardial effusion. Evidence of prior CABG. Calcified plaque is present the thoracic aorta. Mediastinum/Nodes: No mediastinal, hilar, or axillary adenopathy. Thyroid and esophagus are unremarkable. Lungs/Pleura: No pleural effusion or pneumothorax. No consolidation or mass. Upper Abdomen: No acute abnormality. Musculoskeletal: Anterior fusion at C7-T1. No acute osseous abnormality. Review of the MIP images confirms the above findings. IMPRESSION: No evidence of acute pulmonary embolism. No additional acute finding. Aortic Atherosclerosis (ICD10-I70.0). Electronically Signed   By: Macy Mis M.D.   On:  11/07/2019 11:05   Disposition   Pt is being discharged home today in good condition.  Follow-up Plans & Appointments    Follow-up Information    Imogene Burn, PA-C Follow up on 12/05/2019.   Specialty: Cardiology Why: at 11:15am for your follow up appt Contact information: [address] [place] [address] [phone]          Discharge Instructions    Diet - low sodium heart healthy   Complete by: As directed    Increase activity slowly   Complete by: As directed        Discharge Medications     Medication List    TAKE these medications  acetaminophen 500 MG tablet Commonly known as: TYLENOL Take 1,000 mg by mouth every 6 (six) hours as needed for mild pain or headache.   amLODipine 10 MG tablet Commonly known as: NORVASC Take 1 tablet (10 mg total) by mouth daily.   aspirin 81 MG tablet Take 1 tablet (81 mg total) by mouth daily.   atorvastatin 80 MG tablet Commonly known as: LIPITOR TAKE 1 TABLET(80 MG) BY MOUTH DAILY What changed: See the new instructions.   chlordiazePOXIDE 10 MG capsule Commonly known as: LIBRIUM Take 10-20 mg by mouth 3 (three) times daily as needed for anxiety.   clopidogrel 75 MG tablet Commonly known as: PLAVIX Take 1 tablet (75 mg total) by mouth daily.   Doxepin HCl 6 MG Tabs Take 6 mg by mouth at bedtime as needed for sleep.   lisinopril 5 MG tablet Commonly known as: ZESTRIL Take 5 mg by mouth daily. May take 1 additional tablet once daily for SBP greater than 150 once hour after taking daily dose   metoprolol succinate 25 MG 24 hr tablet Commonly known as: TOPROL-XL Take 0.5 tablets (12.5 mg total) by mouth daily. Take with or immediately following a meal. What changed: how much to take   nitroGLYCERIN 0.4 MG SL tablet Commonly known as: NITROSTAT PLACE 1 TABLET UNDER THE TONGUE EVERY 5 MINUTES AS NEEDED FOR CHEST PAIN. 3 DOSES MAX What changed:   how much to take  how to take  this  when to take this  reasons to take this  additional instructions   ONE-A-DAY MENS 50+ ADVANTAGE PO Take 1 tablet by mouth daily.   pantoprazole 40 MG tablet Commonly known as: PROTONIX Take 40 mg by mouth daily.   ranolazine 1000 MG SR tablet Commonly known as: RANEXA Take 1 tablet (1,000 mg total) by mouth 2 (two) times daily. What changed: when to take this       No                               Did the patient have a percutaneous coronary intervention (stent / angioplasty)?:  No.    Outstanding Labs/Studies   N/a  Duration of Discharge Encounter   Greater than 30 minutes including physician time.  Signed, Reino Bellis NP-C 11/08/2019, 12:02 PM     Patient seen and examined. Agree with assessment and plan. Feels better. BP improved at 133/83. No chest pain. HS trop 2. BNP 26.5. CTA neg for PE.  Gi discomfor improved. Will dc today on increased medical regimen and f/u with Dr. Irish Lack.   Troy Sine, MD, [facility] 11/08/2019 12:02 PM

## 2019-11-27 NOTE — Progress Notes (Deleted)
Cardiology Office Note    Date:  11/27/2019   ID:  Aldean, Pipe 1955/12/04, MRN [id number]  PCP:  [name], MD  Cardiologist: Larae Grooms, MD EPS: None  No chief complaint on file.   History of Present Illness:  [name] is a 64 y.o. male with history of CAD status post anterior STEMI and emergent CABG in 2017, multiple episodes of chest pain and cath in 2018 not resulting in PCI.  History of anxiety.  Cath in 2019 PCI to the RCA due to narrowing of the graft to the distal RCA.  Atypical chest pain 05/2018 with sinus tachycardia event monitor 05/2018 no pathologic arrhythmias.  Chest pain 08/2018 echo normal LVEF 55 to 60% no valvular abnormalities, chest CTA no aortic dissection, mild prominence of the ascending aorta at 3.9 x 3.9 cm.  Cath 03/2019 stable anatomy medical therapy recommended  Most recently discharged 11/08/2019 after admission of shortness of breath and elevated blood pressures and heart rate.  He ruled out for an MI.  Was felt his chest pain was most likely GI and and it was recommended he follow-up with his gastroenterologist.  Past Medical History:  Diagnosis Date  . Anxiety   . Arthritis   . Basal cell carcinoma (BCC) of forehead   . CAD (coronary artery disease)    a. 10/2015 ant STEMI >> LHC with 3 v CAD; oLAD tx with POBA >> emergent CABG. b. Multiple evals since that time, early graft failure of SVG-RCA by cath 03/2016. c. 2/19 PCI/DES x1 to pRCA, normal EF.  . Carotid artery disease ([place])    a. 40-59% BICA 02/2018.  Marland Kitchen Depression   . Dyspnea   . Ectopic atrial tachycardia ([place])   . Esophageal reflux    eosinophil esophagitis  . Family history of adverse reaction to anesthesia    "sister has PONV" (06/21/2017)  . Former tobacco use   . Gout   . Hepatitis C    "treated and cured" (06/21/2017)  . High cholesterol   . History of kidney stones   . Hypertension   . Ischemic cardiomyopathy    a. EF 25-30% at intraop TEE  4/17  //  b. Limited Echo 5/17 - EF 45-50%, mild ant HK. c. EF 55-65% by cath 09/2017.  . Migraine    "3-4/yr" (06/21/2017)  . Myocardial infarction ([place]) 10/2015  . Palpitations   . Sinus bradycardia    a. HR dropping into 40s in 02/2016 -> BB reduced.  . Stroke ([place]) 10/2016   "small one; sometimes my memory/cognitive issues" (06/21/2017)  . Symptomatic hypotension    a. 02/2016 ER visit -> meds reduced.  . Syncope     Past Surgical History:  Procedure Laterality Date  . ANTERIOR CERVICAL DECOMP/DISCECTOMY FUSION N/A 10/17/2018   Procedure: Anterior Cervical Decompression Fusion - Cervical seven -Thoracic one;  Surgeon: Consuella Lose, MD;  Location: [place] [facility];  Service: Neurosurgery;  Laterality: N/A;  . BASAL CELL CARCINOMA EXCISION     "forehead  . BIOPSY  07/20/2019   Procedure: BIOPSY;  Surgeon: Carol Ada, MD;  Location: [facility];  Service: Endoscopy;;  . CARDIAC CATHETERIZATION N/A 11/28/2015   Procedure: Left Heart Cath and Coronary Angiography;  Surgeon: [name], MD;  Location: [place] [facility];  Service: Cardiovascular;  Laterality: N/A;  . CARDIAC CATHETERIZATION N/A 11/28/2015   Procedure: Coronary Balloon Angioplasty;  Surgeon: [name], MD;  Location: [place] [facility];  Service: Cardiovascular;  Laterality: N/A;  ostial LAD  . CARDIAC CATHETERIZATION N/A 11/28/2015   Procedure: Coronary/Graft Angiography;  Surgeon: [name], MD;  Location: [place] [facility];  Service: Cardiovascular;  Laterality: N/A;  coronaries only   . CARDIAC CATHETERIZATION N/A 04/21/2016   Procedure: Left Heart Cath and Coronary Angiography;  Surgeon: Wellington Hampshire, MD;  Location: [place] [facility];  Service: Cardiovascular;  Laterality: N/A;  . CARDIAC CATHETERIZATION N/A 06/14/2016   Procedure: Left Heart Cath and Cors/Grafts Angiography;  Surgeon: Lorretta Harp, MD;  Location: [place] [facility];  Service: Cardiovascular;  Laterality: N/A;  .  CARDIAC CATHETERIZATION N/A 09/08/2016   Procedure: Left Heart Cath and Cors/Grafts Angiography;  Surgeon: Wellington Hampshire, MD;  Location: [place] [facility];  Service: Cardiovascular;  Laterality: N/A;  . CARDIAC CATHETERIZATION    . CORONARY ARTERY BYPASS GRAFT N/A 11/28/2015   Procedure: CORONARY ARTERY BYPASS GRAFTING (CABG) TIMES FIVE USING LEFT INTERNAL MAMMARY ARTERY AND RIGHT GREATER SAPHENOUS,VIEN HARVEATED BY ENDOVIEN, INTRAOPPRATIVE TEE;  Surgeon: Gaye Pollack, MD;  Location: [place];  Service: Open Heart Surgery;  Laterality: N/A;  . CORONARY STENT INTERVENTION N/A 10/05/2017   Procedure: CORONARY STENT INTERVENTION;  Surgeon: [name], MD;  Location: [place] [facility];  Service: Cardiovascular;  Laterality: N/A;  . ESOPHAGOGASTRODUODENOSCOPY (EGD) WITH PROPOFOL N/A 07/20/2019   Procedure: ESOPHAGOGASTRODUODENOSCOPY (EGD) WITH PROPOFOL;  Surgeon: Carol Ada, MD;  Location: [facility];  Service: Endoscopy;  Laterality: N/A;  . HUMERUS SURGERY Right 1969   "tumor inside bone; filled it w/bone chips"  . LEFT HEART CATH AND CORS/GRAFTS ANGIOGRAPHY N/A 03/11/2017   Procedure: Left Heart Cath and Cors/Grafts Angiography;  Surgeon: Leonie Man, MD;  Location: [place] [facility];  Service: Cardiovascular;  Laterality: N/A;  . LEFT HEART CATH AND CORS/GRAFTS ANGIOGRAPHY N/A 10/05/2017   Procedure: LEFT HEART CATH AND CORS/GRAFTS ANGIOGRAPHY;  Surgeon: [name], MD;  Location: [place] [facility];  Service: Cardiovascular;  Laterality: N/A;  . LEFT HEART CATH AND CORS/GRAFTS ANGIOGRAPHY N/A 04/11/2019   Procedure: LEFT HEART CATH AND CORS/GRAFTS ANGIOGRAPHY;  Surgeon: [name], MD;  Location: [place] [facility];  Service: Cardiovascular;  Laterality: N/A;  . PERIPHERAL VASCULAR CATHETERIZATION N/A 06/14/2016   Procedure: Lower Extremity Angiography;  Surgeon: Lorretta Harp, MD;  Location: [place] [facility];  Service: Cardiovascular;  Laterality: N/A;     Current Medications: No outpatient medications have been marked as taking for the 12/05/19 encounter (Appointment) with Imogene Burn, PA-C.     Allergies:   Prednisone, Tetanus toxoids, Wellbutrin [bupropion], Morphine and related, Scallops [shellfish allergy], and Chantix [varenicline]   Social History   Socioeconomic History  . Marital status: Married    Spouse name: [name]  . Number of children: 3  . Years of education: College  . Highest education level: Not on file  Occupational History  . Occupation: Scientist, research (physical sciences): SELF-EMPLOYED  Tobacco Use  . Smoking status: Former Smoker    Packs/day: 0.75    Years: 44.00    Pack years: 33.00    Types: Cigarettes    Quit date: 11/28/2015    Years since quitting: 4.0  . Smokeless tobacco: Never Used  Substance and Sexual Activity  . Alcohol use: Yes    Comment: occ  . Drug use: No    Comment: 06/21/2017 "nothing since the 1980s"  . Sexual activity: Yes  Other Topics Concern  . Not on file  Social History Narrative   Patient lives at home with his spouse.   Caffeine Use: yes   Social Determinants of Health   Financial Resource Strain:   . Difficulty of Paying Living Expenses:   Food Insecurity:   . Worried About Charity fundraiser in the Last Year:   . Arboriculturist in the Last Year:   Transportation Needs:   . Film/video editor (Medical):   Marland Kitchen Lack of Transportation (Non-Medical):   Physical Activity:   . Days of Exercise per Week:   . Minutes of Exercise per Session:   Stress:   . Feeling of Stress :   Social Connections:   . Frequency of Communication with Friends and Family:   . Frequency of Social Gatherings with Friends and Family:   . Attends Religious Services:   . Active Member of Clubs or Organizations:   . Attends Archivist Meetings:   Marland Kitchen Marital Status:      Family History:  The patient's ***family history includes Heart Problems in his father; Heart attack in his  maternal grandmother and paternal uncle; Heart attack (age of onset: 55) in his father; Heart failure in his father; Hypertension in his brother; Lung cancer in his mother; Stroke in his father and maternal grandmother.   ROS:   Please see the history of present illness.    ROS All other systems reviewed and are negative.   PHYSICAL EXAM:   VS:  There were no vitals taken for this visit.  Physical Exam  GEN: Well nourished, well developed, in no acute distress  HEENT: normal  Neck: no JVD, carotid bruits, or masses Cardiac:RRR; no murmurs, rubs, or gallops  Respiratory:  clear to auscultation bilaterally, normal work of breathing GI: soft, nontender, nondistended, + BS Ext: without cyanosis, clubbing, or edema, Good distal pulses bilaterally MS: no deformity or atrophy  Skin: warm and dry, no rash Neuro:  Alert and Oriented x 3, Strength and sensation are intact Psych: euthymic mood, full affect  Wt Readings from Last 3 Encounters:  11/08/19 203 lb 14.4 oz (92.5 kg)  08/06/19 195 lb (88.5 kg)  07/20/19 195 lb (88.5 kg)      Studies/Labs Reviewed:   EKG:  EKG is*** ordered today.  The ekg ordered today demonstrates ***  Recent Labs: 11/07/2019: ALT 40; B Natriuretic Peptide 26.5 11/08/2019: BUN 14; Creatinine, Ser 1.14; Hemoglobin 14.0; Platelets 206; Potassium 4.0; Sodium 138   Lipid Panel    Component Value Date/Time   CHOL 160 11/08/2019 0432   CHOL 114 11/08/2017 0833   TRIG 153 (H) 11/08/2019 0432   HDL 40 (L) 11/08/2019 0432   HDL 52 11/08/2017 0833   CHOLHDL 4.0 11/08/2019 0432   VLDL 31 11/08/2019 0432   LDLCALC 89 11/08/2019 0432   LDLCALC 43 11/08/2017 0833    Additional studies/ records that were reviewed today include:  Cardiac cath 03/2019  Mid LAD lesion is 55% stenosed.  Ost LAD to Prox LAD lesion is 50% stenosed. LIMA to LAD is patent.  Ost Ramus lesion is 90% stenosed.  Ramus lesion is 75% stenosed.  Ost Cx lesion is 80% stenosed.  Prox Cx  to Mid Cx lesion is 50% stenosed. SVG to OM is patent.  Dist RCA lesion is 50% stenosed.  RPDA lesion is 40% stenosed. SVG to PDA is patent with proximal disease that is unchanged.  Previously placed Ost RCA to Prox RCA drug eluting stent is widely patent.  Graft  to the diagonal was occluded. There was TIMI 3 flow in the small diagonal with proximal ectasia.  The left ventricular systolic function is normal.  LV end diastolic pressure is normal. LVEDP 11 mm Hg.  The left ventricular ejection fraction is 50-55% by visual estimate.  There is no aortic valve stenosis.   Continue medical therapy.     2D echo 09/28/2018 IMPRESSIONS     1. The left ventricle has normal systolic function of 85-63%. The cavity  size is normal. There is normal left ventricular hypertrophy. Echo  evidence of normal diastolic filling patterns.   2. Normal left atrial size.   3. Normal right atrial size.   4. Normal tricuspid valve.   5. Tricuspid regurgitation is mild.   6. No atrial level shunt detected by color flow Doppler.   FINDINGS   Left Ventricle: No evidence of left ventricular regional wall motion  abnormalities. The left ventricle has normal systolic function of 14-97%.  The cavity size is normal. There is normal left ventricular hypertrophy.  Echo evidence of normal diastolic  filling patterns.  Right Ventricle: The right ventricle is normal in size. There is normal  hypertrophy. There is normal systolic function. Right ventricular systolic  pressure is normal.  Left Atrium: The left atrium is normal in size.  Right Atrium: The right atrial size is normal in size.  Interatrial Septum: No atrial level shunt detected by color flow Doppler.   Pericardium: There is no evidence of pericardial effusion.  Mitral Valve: The mitral valve normal in structure. Regurgitation is  trivial by color flow Doppler.  Tricuspid Valve: The tricuspid valve is normal in structure. Tricuspid  regurgitation  is mild by color flow Doppler.  Aortic Valve: The aortic valve normal in structure and function.  Pulmonic Valve: The pulmonic valve is normal. Pulmonic valve regurgitation  is not visualized by color flow Doppler.  Venous: The inferior vena cava was normal in size with greater than 50%  respiratory variablity.    LEFT VENTRICLE  PLAX 2D ([place])  LV EF:          63.2 %   Diastology  LVIDd:          4.68 cm  LV e' lateral:   11.80 cm/s  LVIDs:          3.08 cm  LV E/e' lateral: 6.9  LV PW:          0.90 cm  LV e' medial:    10.80 cm/s  LV IVS:         0.77 cm  LV E/e' medial:  7.6  LVOT diam:      2.10 cm  LV SV:          64 ml  LVOT Area:      3.46 cm   RIGHT VENTRICLE  RV S prime:     7.01 cm/s  TAPSE (M-mode): 1.1 cm   LEFT ATRIUM             Index       RIGHT ATRIUM           Index  LA diam:        2.70 cm 1.34 cm/m  RA Pressure: 10 mmHg  LA Vol (A2C):   37.0 ml 18.30 ml/m RA Area:     14.50 cm  LA Vol (A4C):   24.2 ml 11.97 ml/m RA Volume:   35.80 ml  17.71 ml/m  LA Biplane Vol: 33.2 ml 16.[place]  ml/m   AORTIC VALVE  LVOT Vmax:   91.20 cm/s  LVOT Vmean:  67.500 cm/s  LVOT VTI:    0.203 m    AORTA  Ao Root diam: 3.20 cm   MITRAL VALVE              TRICUSPID VALVE  MV Area (PHT): 3.08 cm   TV Peak grad:   23.2 mmHg  MV PHT:        71.34 msec TV Vmax:        2.41 m/s  MV Decel Time: 246 msec  MV E velocity: 81.70 cm/s  MV A velocity: 71.00 cm/s  MV E/A ratio:  1.15     Skeet Latch MD  Electronically signed by Skeet Latch MD  Signature Date/Time: 09/28/2018/12:45:58 PM        ASSESSMENT:    No diagnosis found.   PLAN:  In order of problems listed above:  CAD status post STEMI with emergency CABG in 2017, PCI to RCA in 09/2017, multiple admissions with chest pain again most recently 10/2019 ruled out for an MI.  Chest pain felt to be GI related  Ischemic cardiomyopathy LVEF improved to 55 to 60% on echo 08/2018  Essential  hypertension  Bilateral carotid artery disease 40 to 59% in 2019 on aspirin Plavix and Lipitor.  Will need repeat carotid Dopplers  Hyperlipidemia LDL 89 10/2019    Medication Adjustments/Labs and Tests Ordered: Current medicines are reviewed at length with the patient today.  Concerns regarding medicines are outlined above.  Medication changes, Labs and Tests ordered today are listed in the Patient Instructions below. There are no Patient Instructions on file for this visit.   Sumner Boast, PA-C  11/27/2019 3:26 PM    [facility] [address], [place], [place]  [address] Phone: [phone]; Fax: [phone]

## 2019-12-05 ENCOUNTER — Ambulatory Visit: Payer: BC Managed Care – PPO | Admitting: Physician Assistant

## 2019-12-07 IMAGING — CR DG CHEST 2V
2 series · 2 of 2 positions shown · non-contrast
Comparison: 01/03/2018

CLINICAL DATA: Chest pain

EXAM:
CHEST - 2 VIEW

[chest pa]
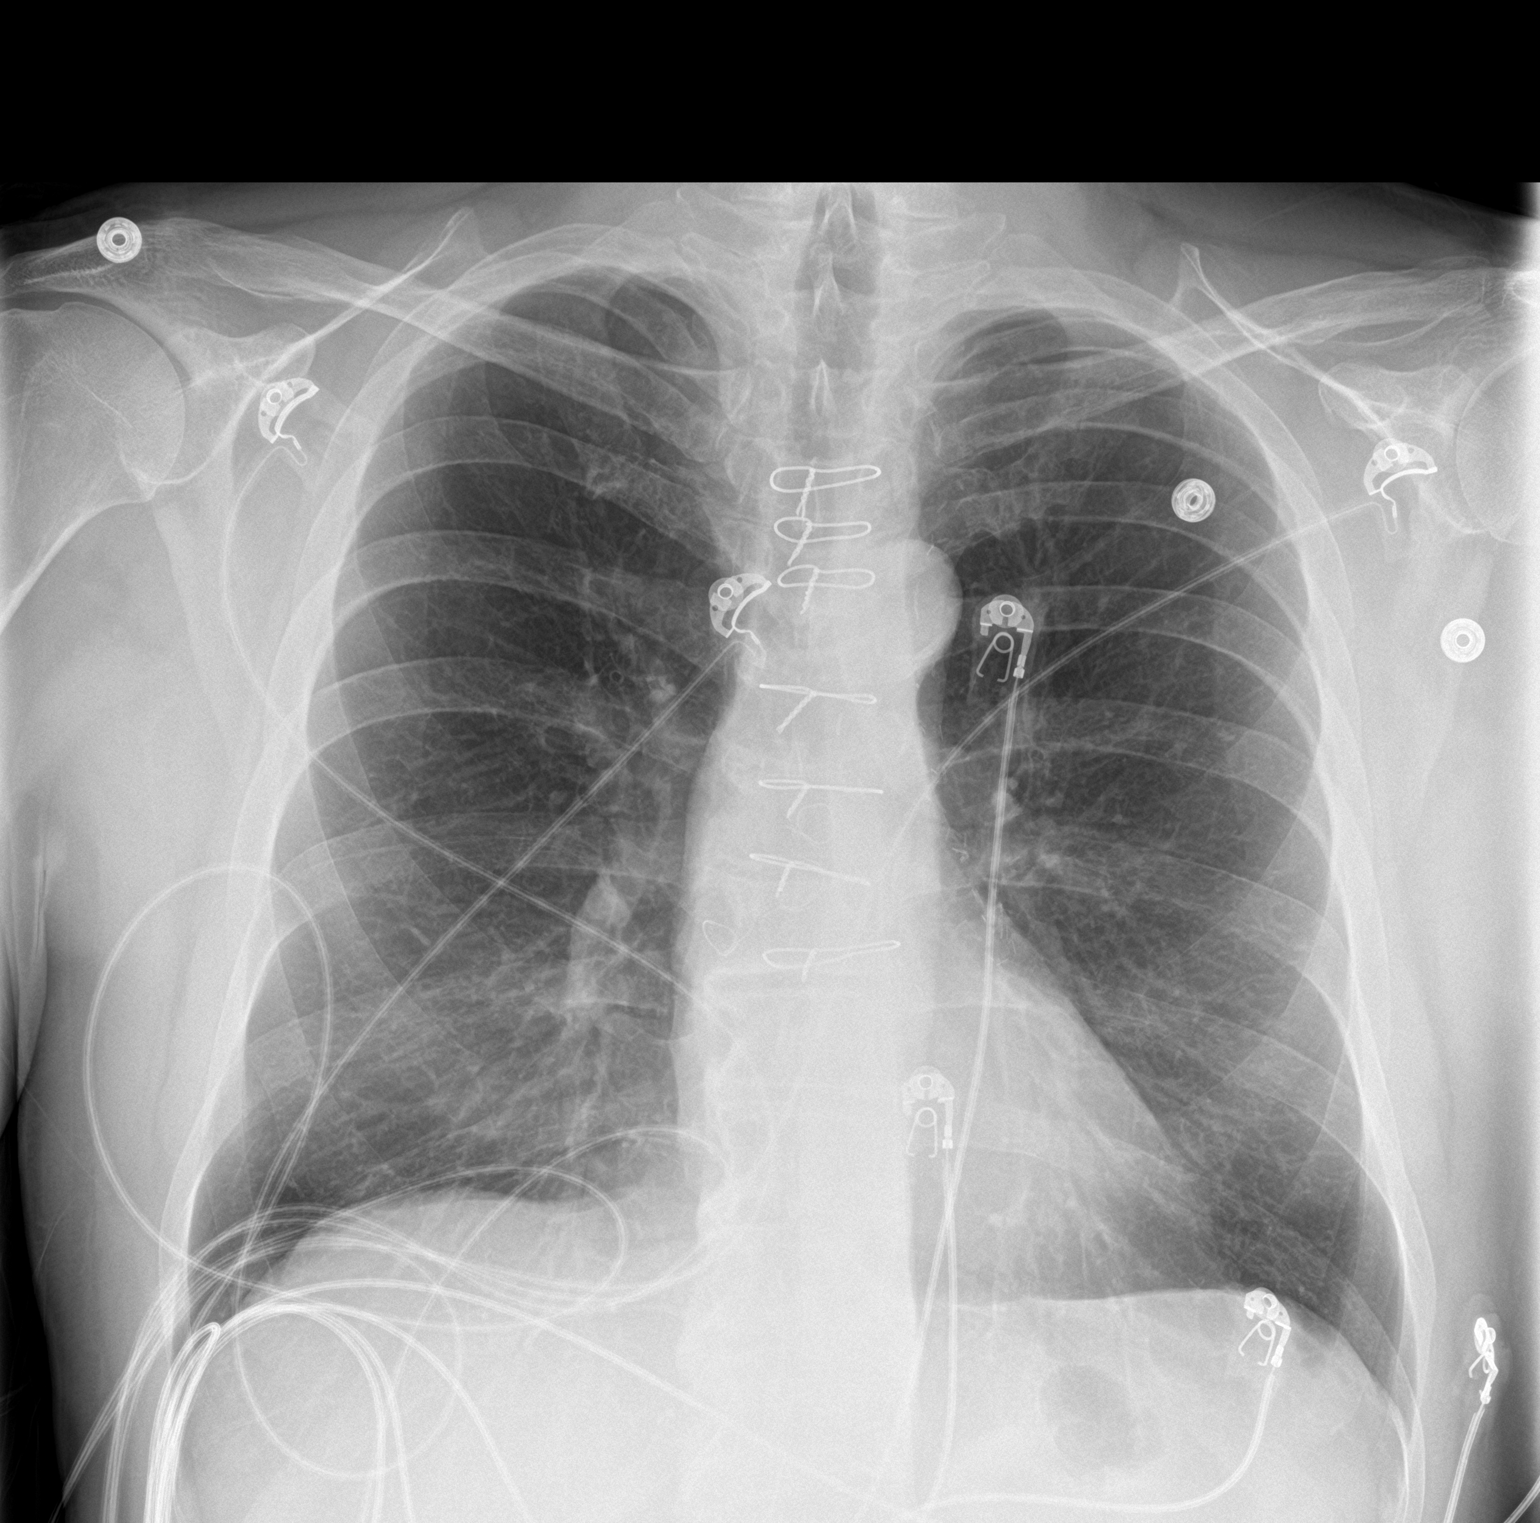

[chest lat]
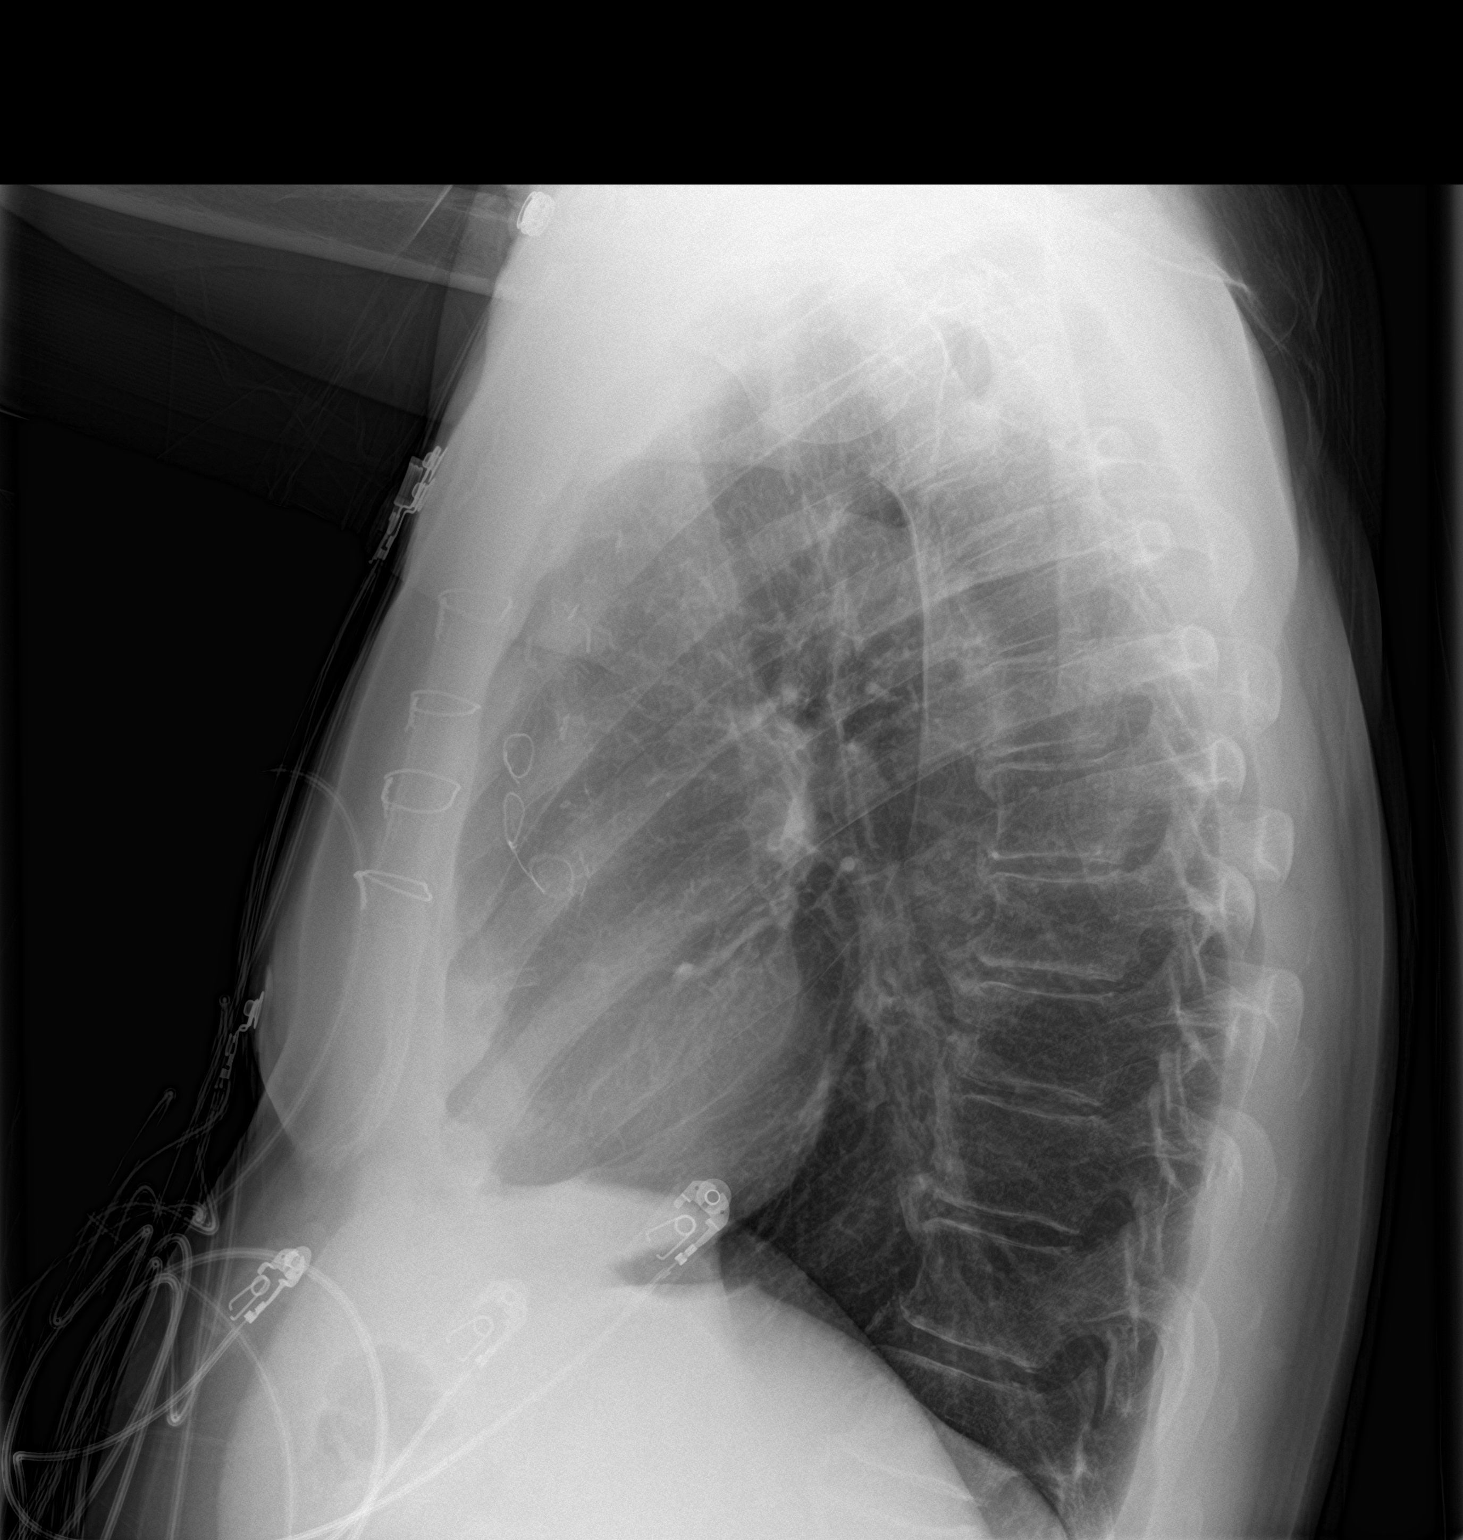

[2 of 2 positions shown; findings below may reference images not displayed]

FINDINGS: Lungs are clear.  No pleural effusion or pneumothorax.

The heart is normal in size. Postsurgical changes related to prior
CABG.

Degenerative changes of the visualized thoracolumbar spine. Median
sternotomy.
IMPRESSION: Normal chest radiographs.

## 2019-12-10 ENCOUNTER — Other Ambulatory Visit: Payer: Self-pay

## 2019-12-10 MED ORDER — ATORVASTATIN CALCIUM 80 MG PO TABS: ORAL_TABLET | ORAL | 2 refills | Status: DC

## 2019-12-10 NOTE — Telephone Encounter (Signed)
Pt's medication was sent to pt's pharmacy as requested. Confirmation received.  °

## 2020-01-02 ENCOUNTER — Other Ambulatory Visit: Payer: BC Managed Care – PPO

## 2020-01-21 IMAGING — DX DG CHEST 2V
2 series · 2 of 2 positions shown · non-contrast
Comparison: CT 02/09/2018.

CLINICAL DATA: Chest pain.  Dizziness.

EXAM:
CHEST - 2 VIEW

[chest pa]
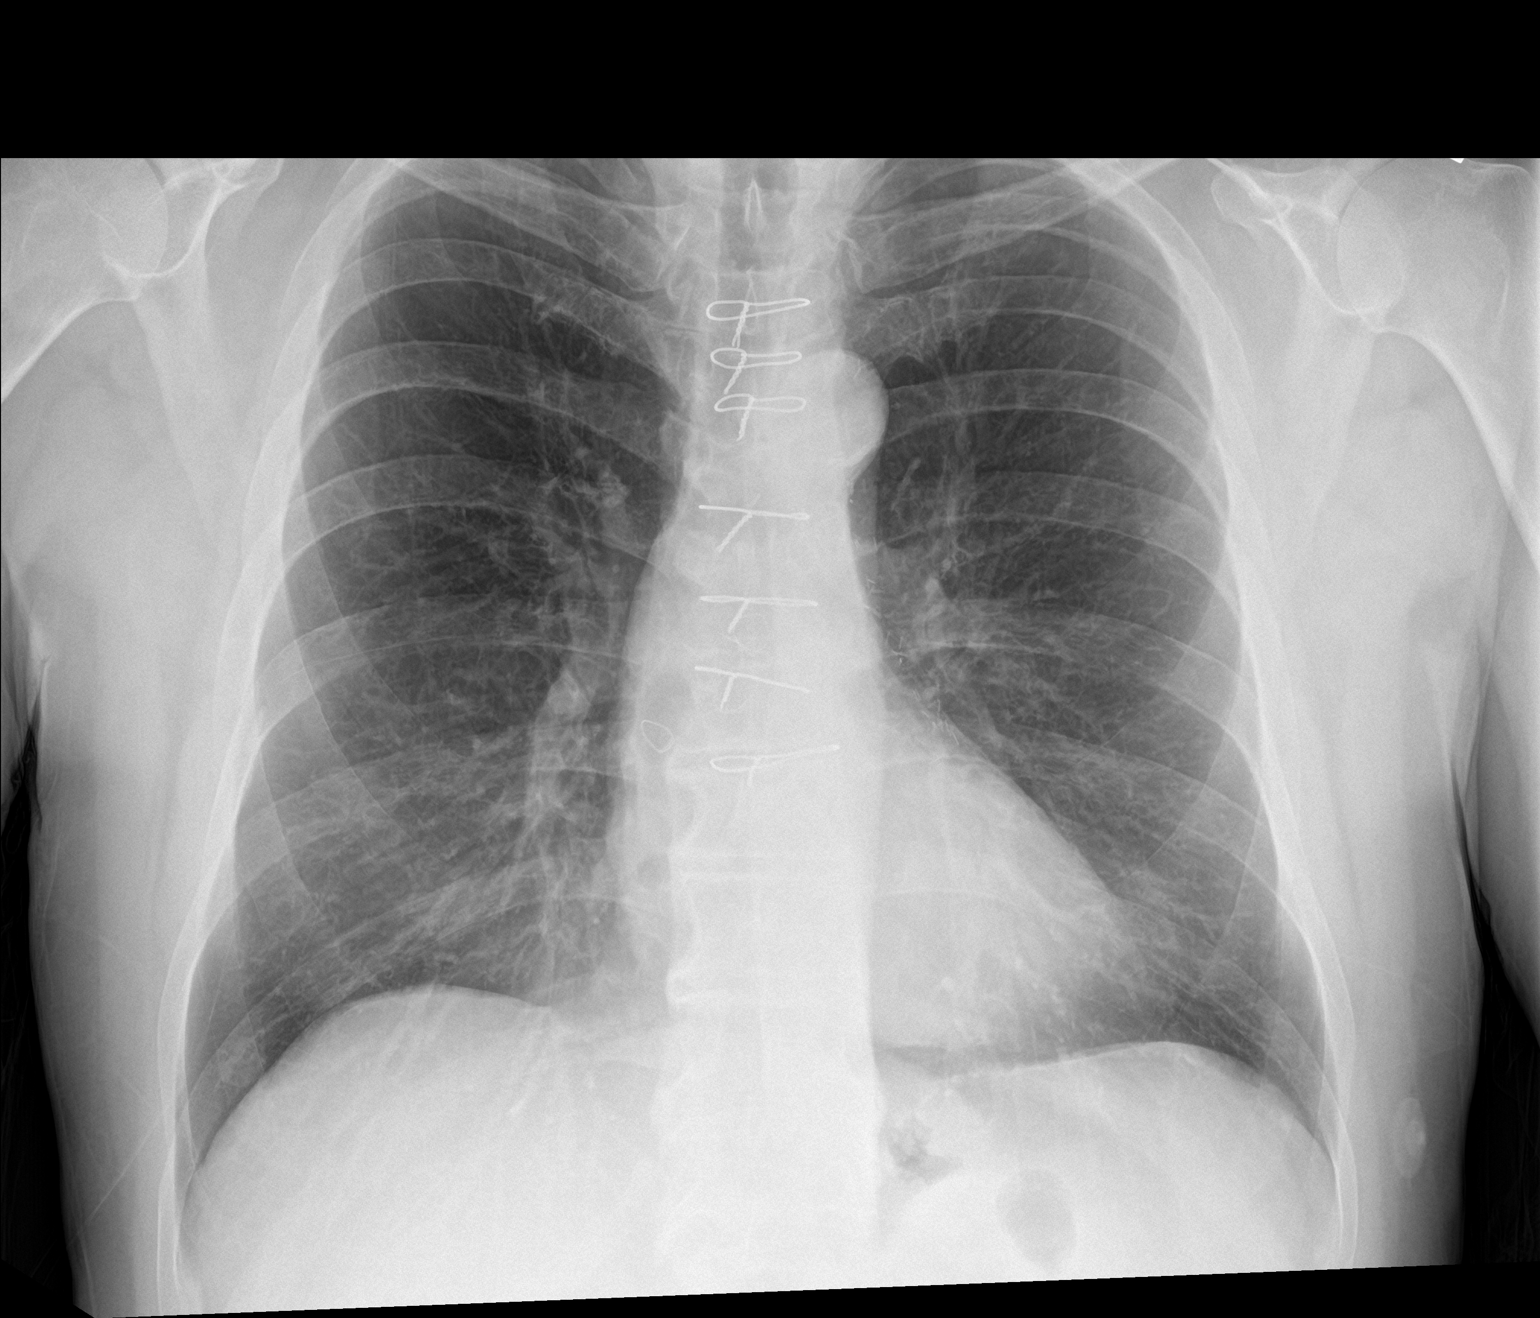

[chest lat]
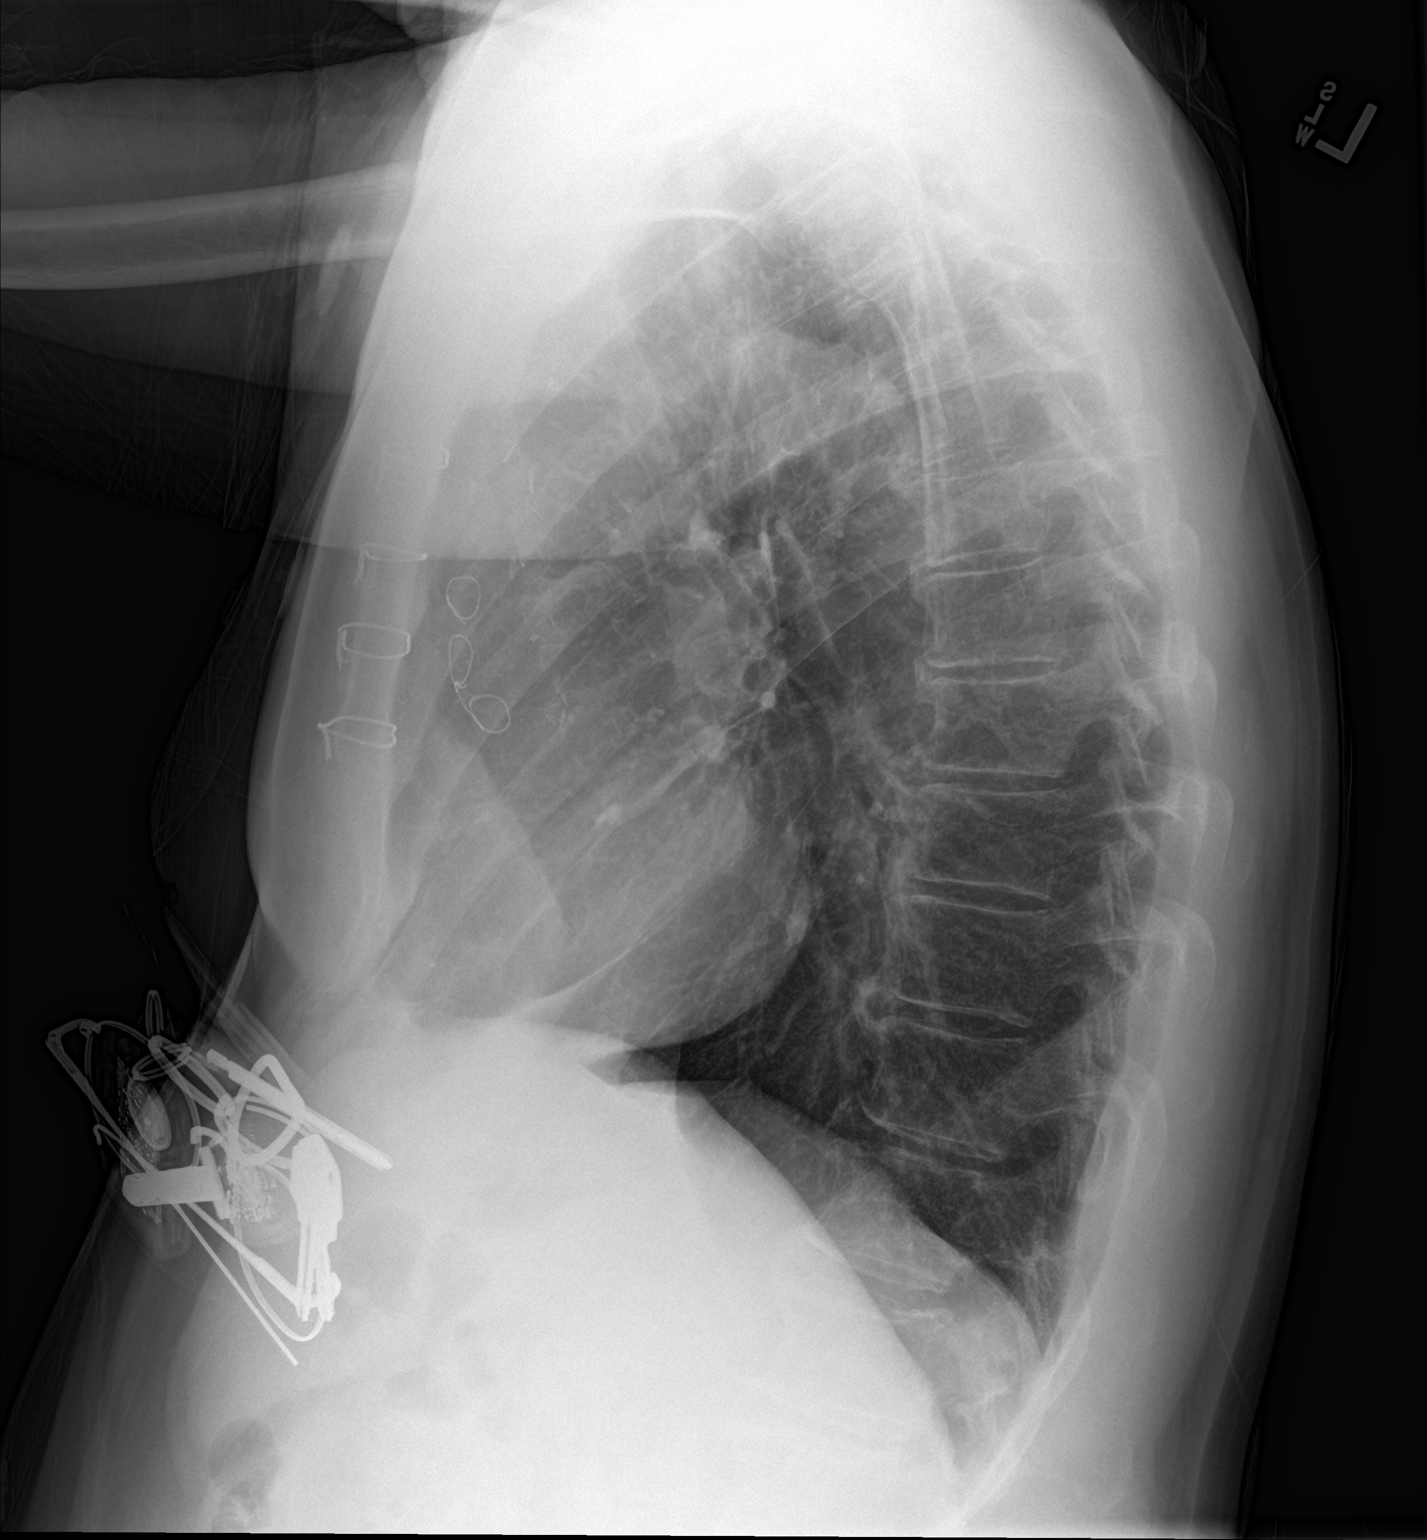

[2 of 2 positions shown; findings below may reference images not displayed]

FINDINGS: Mediastinum hilar structures normal. Lungs are clear. No pleural
effusion or pneumothorax. No acute cardiopulmonary disease. No acute
bony abnormality.
IMPRESSION: 1.  Prior CABG.  Heart size normal.

2.  No acute pulmonary disease.

## 2020-01-23 ENCOUNTER — Other Ambulatory Visit: Payer: Self-pay | Admitting: Family Medicine

## 2020-01-23 DIAGNOSIS — R413 Other amnesia: Secondary | ICD-10-CM

## 2020-01-23 DIAGNOSIS — Z8673 Personal history of transient ischemic attack (TIA), and cerebral infarction without residual deficits: Secondary | ICD-10-CM

## 2020-02-04 ENCOUNTER — Other Ambulatory Visit: Payer: Self-pay | Admitting: Physician Assistant

## 2020-02-04 DIAGNOSIS — I255 Ischemic cardiomyopathy: Secondary | ICD-10-CM

## 2020-02-04 DIAGNOSIS — I2510 Atherosclerotic heart disease of native coronary artery without angina pectoris: Secondary | ICD-10-CM

## 2020-02-20 ENCOUNTER — Other Ambulatory Visit: Payer: Self-pay | Admitting: Family Medicine

## 2020-02-23 ENCOUNTER — Ambulatory Visit
Admission: RE | Admit: 2020-02-23 | Discharge: 2020-02-23 | Disposition: A | Discharge status: Home / Self Care | Payer: BC Managed Care – PPO | Source: Ambulatory Visit | Attending: Family Medicine | Admitting: Family Medicine

## 2020-02-23 ENCOUNTER — Other Ambulatory Visit: Payer: Self-pay

## 2020-02-23 DIAGNOSIS — R413 Other amnesia: Secondary | ICD-10-CM

## 2020-02-23 DIAGNOSIS — Z8673 Personal history of transient ischemic attack (TIA), and cerebral infarction without residual deficits: Secondary | ICD-10-CM

## 2020-02-23 MED ORDER — GADOBENATE DIMEGLUMINE 529 MG/ML IV SOLN
18.0000 mL | Freq: Once | INTRAVENOUS | Status: AC | PRN
  Administered 2020-02-23: 18 mL via INTRAVENOUS

## 2020-03-08 ENCOUNTER — Other Ambulatory Visit: Payer: Self-pay | Admitting: Interventional Cardiology

## 2020-03-13 IMAGING — DX DG CHEST 2V
2 series · 2 of 2 positions shown · non-contrast
Comparison: 03/24/2018, CT 02/09/2018

CLINICAL DATA: Chest pain

EXAM:
CHEST - 2 VIEW

[chest pa]
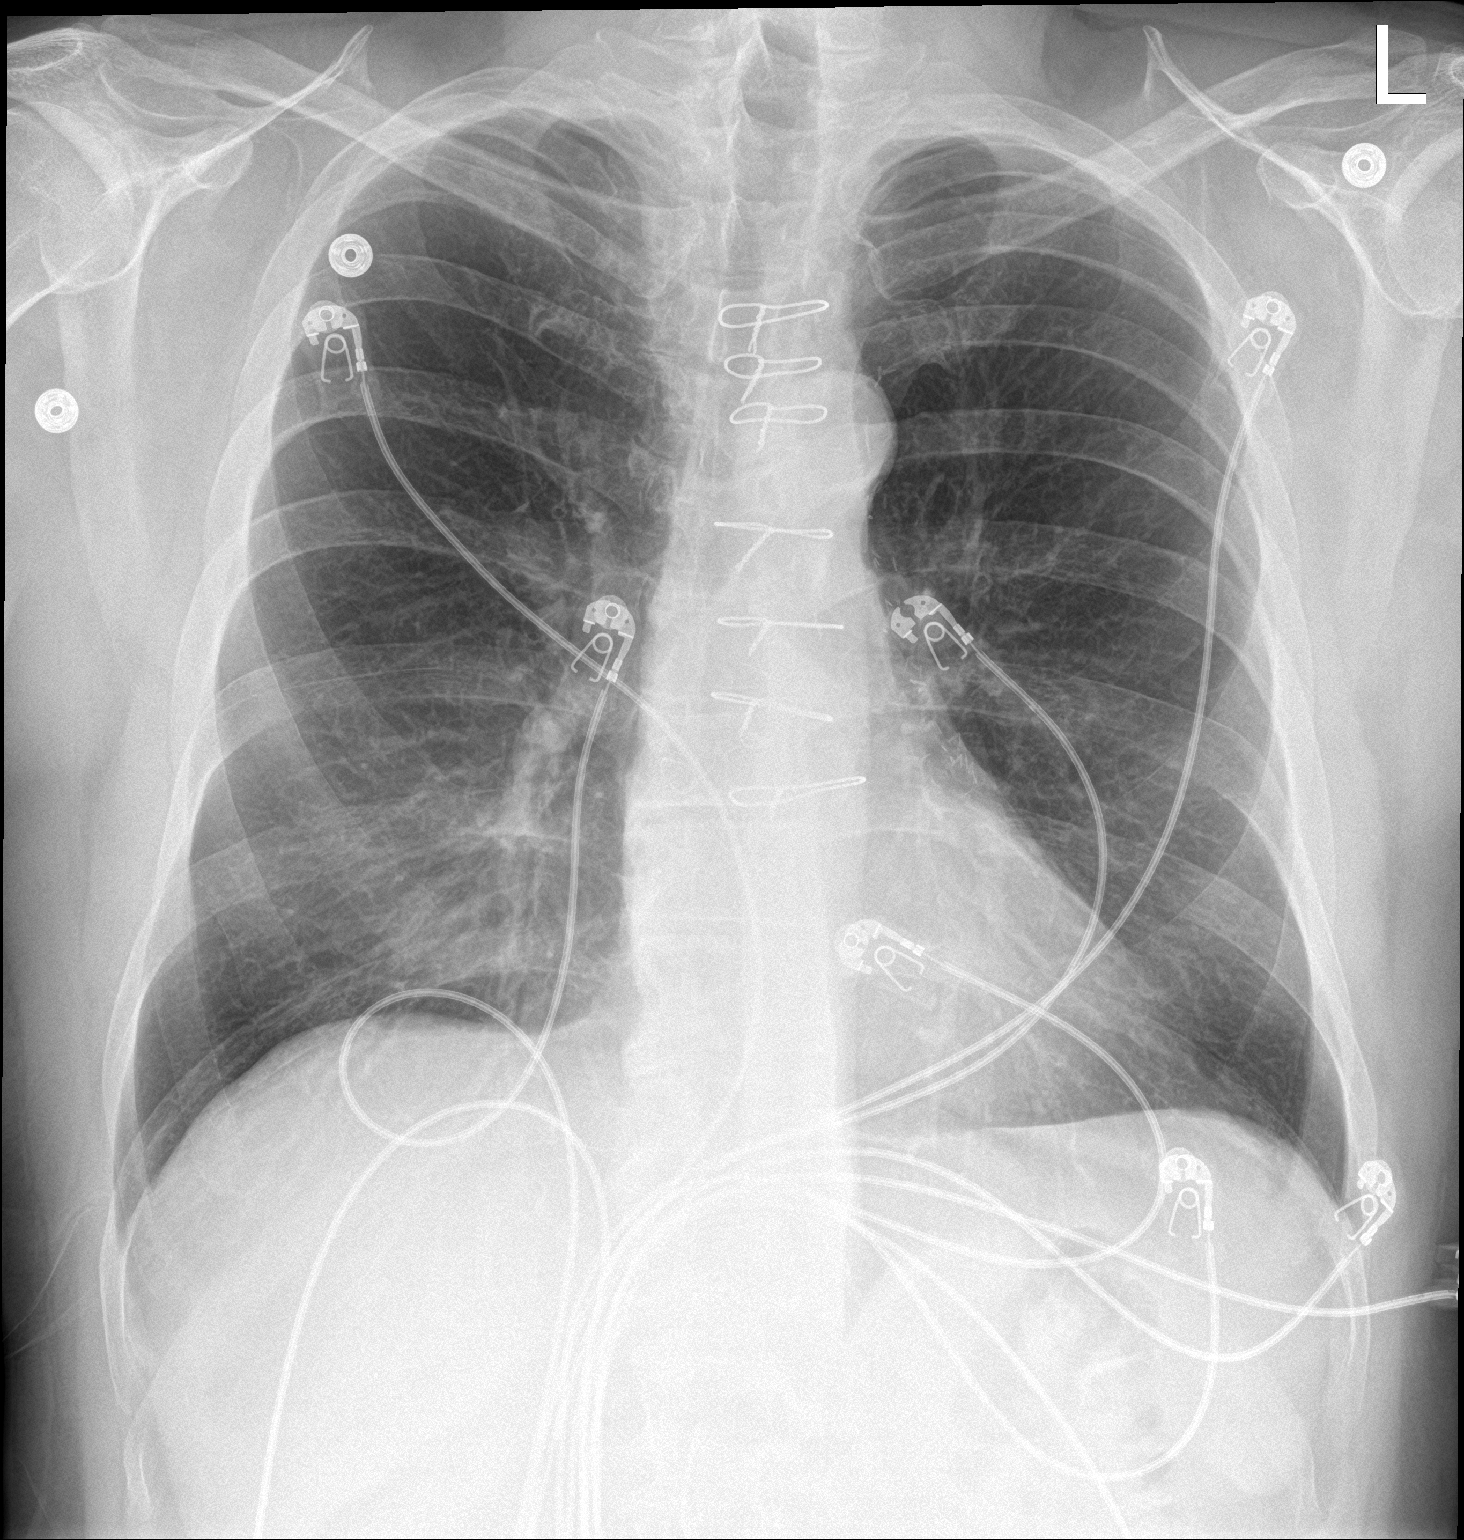

[chest lat]
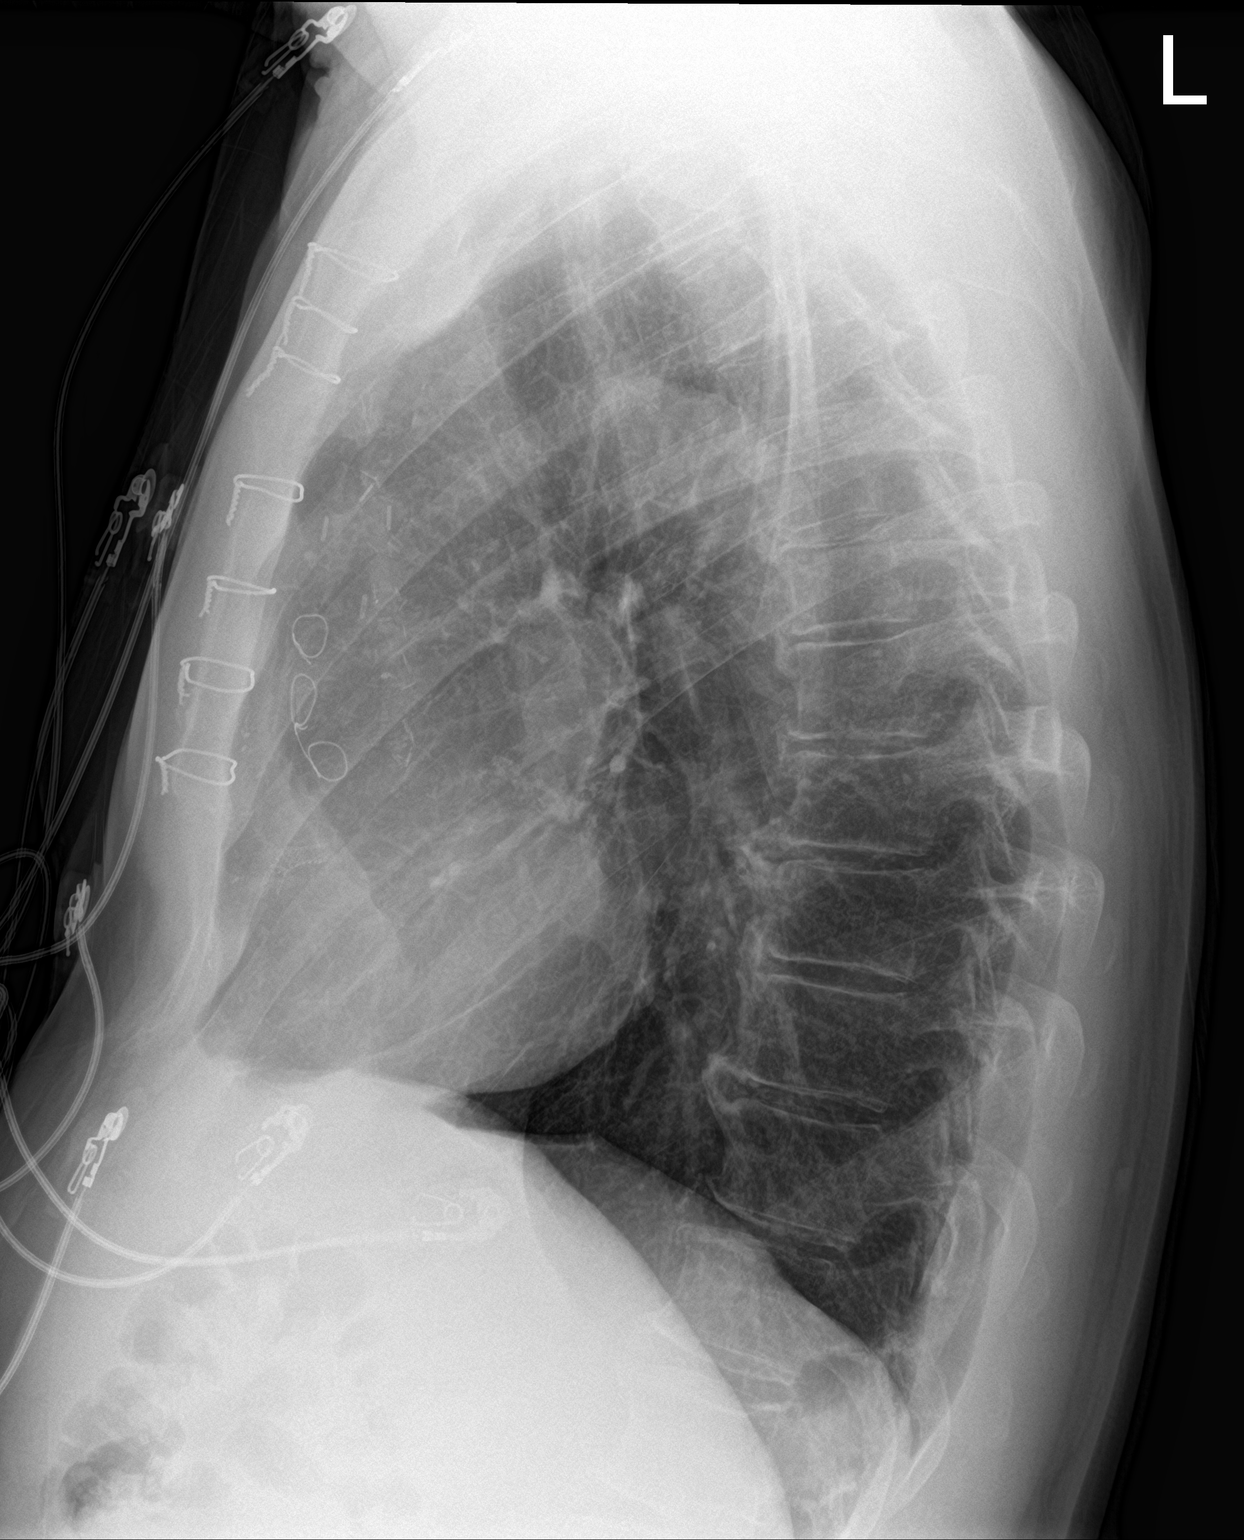

[2 of 2 positions shown; findings below may reference images not displayed]

FINDINGS: Post sternotomy changes. No focal opacity or pleural effusion.
Normal cardiomediastinal silhouette with aortic atherosclerosis. No
pneumothorax.
IMPRESSION: No active cardiopulmonary disease.

## 2020-04-04 ENCOUNTER — Emergency Department (HOSPITAL_COMMUNITY): Payer: Medicare Other

## 2020-04-04 ENCOUNTER — Encounter (HOSPITAL_COMMUNITY): Payer: Self-pay

## 2020-04-04 ENCOUNTER — Emergency Department (HOSPITAL_COMMUNITY)
Admission: EM | Admit: 2020-04-04 | Discharge: 2020-04-04 | Disposition: A | Discharge status: Home / Self Care | Payer: Medicare Other | Attending: Emergency Medicine | Admitting: Emergency Medicine

## 2020-04-04 ENCOUNTER — Other Ambulatory Visit: Payer: Self-pay

## 2020-04-04 DIAGNOSIS — Z87891 Personal history of nicotine dependence: Secondary | ICD-10-CM | POA: Insufficient documentation

## 2020-04-04 DIAGNOSIS — Z79899 Other long term (current) drug therapy: Secondary | ICD-10-CM | POA: Insufficient documentation

## 2020-04-04 DIAGNOSIS — R0789 Other chest pain: Secondary | ICD-10-CM | POA: Insufficient documentation

## 2020-04-04 DIAGNOSIS — Z20822 Contact with and (suspected) exposure to covid-19: Secondary | ICD-10-CM | POA: Insufficient documentation

## 2020-04-04 DIAGNOSIS — I25119 Atherosclerotic heart disease of native coronary artery with unspecified angina pectoris: Secondary | ICD-10-CM | POA: Insufficient documentation

## 2020-04-04 DIAGNOSIS — Z951 Presence of aortocoronary bypass graft: Secondary | ICD-10-CM | POA: Diagnosis not present

## 2020-04-04 DIAGNOSIS — I10 Essential (primary) hypertension: Secondary | ICD-10-CM | POA: Insufficient documentation

## 2020-04-04 DIAGNOSIS — R079 Chest pain, unspecified: Secondary | ICD-10-CM

## 2020-04-04 LAB — BASIC METABOLIC PANEL
Anion gap: 13 (ref 5–15)
BUN: 25 mg/dL — ABNORMAL HIGH (ref 8–23)
CO2: 20 mmol/L — ABNORMAL LOW (ref 22–32)
Calcium: 9.4 mg/dL (ref 8.9–10.3)
Chloride: 104 mmol/L (ref 98–111)
Creatinine, Ser: 1.36 mg/dL — ABNORMAL HIGH (ref 0.61–1.24)
GFR calc Af Amer: >60 mL/min (ref >60)
GFR calc non Af Amer: 54 mL/min — ABNORMAL LOW (ref >60)
Glucose, Bld: 105 mg/dL — ABNORMAL HIGH (ref 70–99)
Potassium: 3.9 mmol/L (ref 3.5–5.1)
Sodium: 137 mmol/L (ref 135–145)

## 2020-04-04 LAB — CBC WITH DIFFERENTIAL/PLATELET
Abs Immature Granulocytes: 0.04 10*3/uL (ref 0.00–0.07)
Basophils Absolute: 0.1 10*3/uL (ref 0.0–0.1)
Basophils Relative: 1 %
Eosinophils Absolute: 0.5 10*3/uL (ref 0.0–0.5)
Eosinophils Relative: 5 %
HCT: 42.2 % (ref 39.0–52.0)
Hemoglobin: 14.2 g/dL (ref 13.0–17.0)
Immature Granulocytes: 0 %
Lymphocytes Relative: 31 %
Lymphs Abs: 3.2 10*3/uL (ref 0.7–4.0)
MCH: 30.9 pg (ref 26.0–34.0)
MCHC: 33.6 g/dL (ref 30.0–36.0)
MCV: 91.9 fL (ref 80.0–100.0)
Monocytes Absolute: 0.9 10*3/uL (ref 0.1–1.0)
Monocytes Relative: 8 %
Neutro Abs: 5.5 10*3/uL (ref 1.7–7.7)
Neutrophils Relative %: 55 %
Platelets: 223 10*3/uL (ref 150–400)
RBC: 4.59 MIL/uL (ref 4.22–5.81)
RDW: 12.5 % (ref 11.5–15.5)
WBC: 10.2 10*3/uL (ref 4.0–10.5)
nRBC: 0 % (ref 0.0–0.2)

## 2020-04-04 LAB — SARS CORONAVIRUS 2 BY RT PCR (HOSPITAL ORDER, PERFORMED IN ~~LOC~~ HOSPITAL LAB): SARS Coronavirus 2: NEGATIVE

## 2020-04-04 LAB — TROPONIN I (HIGH SENSITIVITY)
Troponin I (High Sensitivity): 4 ng/L (ref <18)
Troponin I (High Sensitivity): 5 ng/L (ref <18)

## 2020-04-04 MED ORDER — KETOROLAC TROMETHAMINE 15 MG/ML IJ SOLN
15.0000 mg | Freq: Once | INTRAMUSCULAR | Status: AC
  Administered 2020-04-04: 15 mg via INTRAVENOUS
  Filled 2020-04-04: qty 1

## 2020-04-04 MED ORDER — ALUM & MAG HYDROXIDE-SIMETH 200-200-20 MG/5ML PO SUSP
30.0000 mL | Freq: Once | ORAL | Status: AC
  Administered 2020-04-04: 30 mL via ORAL
  Filled 2020-04-04: qty 30

## 2020-04-04 MED ORDER — SODIUM CHLORIDE 0.9 % IV BOLUS
1000.0000 mL | Freq: Once | INTRAVENOUS | Status: AC
  Administered 2020-04-04: 1000 mL via INTRAVENOUS

## 2020-04-04 MED ORDER — LIDOCAINE VISCOUS HCL 2 % MT SOLN
15.0000 mL | Freq: Once | OROMUCOSAL | Status: AC
  Administered 2020-04-04: 15 mL via ORAL
  Filled 2020-04-04: qty 15

## 2020-04-04 NOTE — ED Provider Notes (Signed)
[facility] EMERGENCY DEPARTMENT Provider Note   CSN: [id number] Arrival date & time: 04/04/20  0043     History Chief Complaint  Patient presents with  . Chest Pain    [name] is a 64 y.o. male.  The history is provided by the patient and medical records.  Chest Pain   64 year old male with history of anxiety, coronary artery disease status post stenting and CABG, depression, acid reflux, gout, hepatitis C, hypertension, ischemic cardiomyopathy, migraines, prior stroke, presenting to the ED with chest pain.  States this began this evening while he was sitting on the couch watching a football game.  States pain is along his left chest into the axilla and then up into left shoulder and left neck.  States with onset of pain he did feel somewhat clammy and was short of breath, unsure of his breathing was like that due to anxiety of having chest pain.  He did take 1 nitro at home but then noticed his blood pressure dropped so did not take any more.  He did not really have any change with pain.  He has had full dose aspirin.  States he had an MI in 2017 and since then he gets about 2 bad episodes of chest pain a year.  He was admitted in March for similar with reassuring work-up.  Last heart cath 04/11/2019 with multivessel disease but no new PCI, medical therapy continued.  He is followed by cardiology, Dr. Scarlette Calico.  Past Medical History:  Diagnosis Date  . Anxiety   . Arthritis   . Basal cell carcinoma (BCC) of forehead   . CAD (coronary artery disease)    a. 10/2015 ant STEMI >> LHC with 3 v CAD; oLAD tx with POBA >> emergent CABG. b. Multiple evals since that time, early graft failure of SVG-RCA by cath 03/2016. c. 2/19 PCI/DES x1 to pRCA, normal EF.  . Carotid artery disease ([place])    a. 40-59% BICA 02/2018.  Marland Kitchen Depression   . Dyspnea   . Ectopic atrial tachycardia ([place])   . Esophageal reflux    eosinophil esophagitis  . Family history of adverse  reaction to anesthesia    "sister has PONV" (06/21/2017)  . Former tobacco use   . Gout   . Hepatitis C    "treated and cured" (06/21/2017)  . High cholesterol   . History of kidney stones   . Hypertension   . Ischemic cardiomyopathy    a. EF 25-30% at intraop TEE 4/17  //  b. Limited Echo 5/17 - EF 45-50%, mild ant HK. c. EF 55-65% by cath 09/2017.  . Migraine    "3-4/yr" (06/21/2017)  . Myocardial infarction ([place]) 10/2015  . Palpitations   . Sinus bradycardia    a. HR dropping into 40s in 02/2016 -> BB reduced.  . Stroke ([place]) 10/2016   "small one; sometimes my memory/cognitive issues" (06/21/2017)  . Symptomatic hypotension    a. 02/2016 ER visit -> meds reduced.  . Syncope     Patient Active Problem List   Diagnosis Date Noted  . Chest pain 11/07/2019  . Gastroesophageal reflux disease   . Cervical radiculopathy 10/17/2018  . Preoperative clearance 10/04/2018  . Palpitations   . Coronary artery disease involving coronary bypass graft of native heart with angina pectoris ([place])   . Transient loss of consciousness 03/24/2018  . Ectopic atrial tachycardia ([place]) 02/09/2018  . Central chest pain 03/10/2017  . Family hx-stroke 11/10/2016  . Stroke-like  episode ([place]) - R brain, s/p tPA 11/09/2016  . Unstable angina ([place]) 09/07/2016  . Claudication of both lower extremities ([place])   . Pure hypercholesterolemia   . Tobacco abuse disorder   . CAD of autologous artery bypass graft without angina   . Chest pain at rest 06/10/2016  . Abnormal nuclear stress test - HIGH RISK 04/20/2016  . Old MI (myocardial infarction)   . Essential hypertension 02/26/2016  . Ischemic cardiomyopathy 12/25/2015  . Hyperlipidemia LDL goal <70 12/25/2015  . 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 Coronary artery disease involving native coronary artery of native heart with angina pectoris ([place]) 11/28/2015  . 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 Acute MI anterior wall first episode care [facility])   .  Precordial chest pain 03/07/2015  . Dysphagia 03/07/2015  . Gout attack 03/07/2015  . Mixed bipolar I disorder ([place]) 03/07/2015  . Fibromyalgia 07/09/2014  . Gout 07/09/2014  . Anxiety 07/09/2014  . Depression 07/09/2014  . Hepatitis C 11/20/2012  . Eosinophilic esophagitis 16/05/9603    Past Surgical History:  Procedure Laterality Date  . ANTERIOR CERVICAL DECOMP/DISCECTOMY FUSION N/A 10/17/2018   Procedure: Anterior Cervical Decompression Fusion - Cervical seven -Thoracic one;  Surgeon: Consuella Lose, MD;  Location: [facility];  Service: Neurosurgery;  Laterality: N/A;  . BASAL CELL CARCINOMA EXCISION     "forehead  . BIOPSY  07/20/2019   Procedure: BIOPSY;  Surgeon: Carol Ada, MD;  Location: [facility];  Service: Endoscopy;;  . CARDIAC CATHETERIZATION N/A 11/28/2015   Procedure: Left Heart Cath and Coronary Angiography;  Surgeon: [name], MD;  Location: [facility] CV LAB;  Service: Cardiovascular;  Laterality: N/A;  . CARDIAC CATHETERIZATION N/A 11/28/2015   Procedure: Coronary Balloon Angioplasty;  Surgeon: [name], MD;  Location: [facility] LAB;  Service: Cardiovascular;  Laterality: N/A;  ostial LAD  . CARDIAC CATHETERIZATION N/A 11/28/2015   Procedure: Coronary/Graft Angiography;  Surgeon: [name], MD;  Location: [facility] LAB;  Service: Cardiovascular;  Laterality: N/A;  coronaries only   . CARDIAC CATHETERIZATION N/A 04/21/2016   Procedure: Left Heart Cath and Coronary Angiography;  Surgeon: Wellington Hampshire, MD;  Location: [facility] CV LAB;  Service: Cardiovascular;  Laterality: N/A;  . CARDIAC CATHETERIZATION N/A 06/14/2016   Procedure: Left Heart Cath and Cors/Grafts Angiography;  Surgeon: Lorretta Harp, MD;  Location: [facility] CV LAB;  Service: Cardiovascular;  Laterality: N/A;  . CARDIAC CATHETERIZATION N/A 09/08/2016   Procedure: Left Heart Cath and Cors/Grafts Angiography;  Surgeon: Wellington Hampshire, MD;  Location:  [facility] CV LAB;  Service: Cardiovascular;  Laterality: N/A;  . CARDIAC CATHETERIZATION    . CORONARY ARTERY BYPASS GRAFT N/A 11/28/2015   Procedure: CORONARY ARTERY BYPASS GRAFTING (CABG) TIMES FIVE USING LEFT INTERNAL MAMMARY ARTERY AND RIGHT GREATER SAPHENOUS,VIEN HARVEATED BY ENDOVIEN, INTRAOPPRATIVE TEE;  Surgeon: Gaye Pollack, MD;  Location: [facility];  Service: Open Heart Surgery;  Laterality: N/A;  . CORONARY STENT INTERVENTION N/A 10/05/2017   Procedure: CORONARY STENT INTERVENTION;  Surgeon: [name], MD;  Location: [facility] CV LAB;  Service: Cardiovascular;  Laterality: N/A;  . ESOPHAGOGASTRODUODENOSCOPY (EGD) WITH PROPOFOL N/A 07/20/2019   Procedure: ESOPHAGOGASTRODUODENOSCOPY (EGD) WITH PROPOFOL;  Surgeon: Carol Ada, MD;  [facility]: [facility];  Service: Endoscopy;  Laterality: N/A;  . HUMERUS SURGERY Right 1969   "tumor inside bone; filled it w/bone chips"  . LEFT HEART CATH AND CORS/GRAFTS ANGIOGRAPHY N/A 03/11/2017  Procedure: Left Heart Cath and Cors/Grafts Angiography;  Surgeon: Leonie Man, MD;  Location: [facility] CV LAB;  Service: Cardiovascular;  Laterality: N/A;  . LEFT HEART CATH AND CORS/GRAFTS ANGIOGRAPHY N/A 10/05/2017   Procedure: LEFT HEART CATH AND CORS/GRAFTS ANGIOGRAPHY;  Surgeon: [name], MD;  Location: [facility] CV LAB;  Service: Cardiovascular;  Laterality: N/A;  . LEFT HEART CATH AND CORS/GRAFTS ANGIOGRAPHY N/A 04/11/2019   Procedure: LEFT HEART CATH AND CORS/GRAFTS ANGIOGRAPHY;  Surgeon: [name], MD;  Location: [facility] CV LAB;  Service: Cardiovascular;  Laterality: N/A;  . PERIPHERAL VASCULAR CATHETERIZATION N/A 06/14/2016   Procedure: Lower Extremity Angiography;  Surgeon: Lorretta Harp, MD;  Location: [facility] CV LAB;  Service: Cardiovascular;  Laterality: N/A;       Family History  Problem Relation Age of Onset  . Lung cancer Mother   . Heart Problems Father   . Heart attack Father 95  .  Stroke Father   . Heart failure Father   . Heart attack Maternal Grandmother   . Stroke Maternal Grandmother   . Heart attack Paternal Uncle   . Hypertension Brother   . Autoimmune disease Neg Hx     Social History   Tobacco Use  . Smoking status: Former Smoker    Packs/day: 0.75    Years: 44.00    Pack years: 33.00    Types: Cigarettes    Quit date: 11/28/2015    Years since quitting: 4.3  . Smokeless tobacco: Never Used  Vaping Use  . Vaping Use: Never used  Substance Use Topics  . Alcohol use: Yes    Comment: occ  . Drug use: No    Comment: 06/21/2017 "nothing since the 1980s"    Home Medications Prior to Admission medications   Medication Sig Start Date End Date Taking? Authorizing Provider  acetaminophen (TYLENOL) 500 MG tablet Take 1,000 mg by mouth every 6 (six) hours as needed for mild pain or headache.     [provider]  amLODipine (NORVASC) 10 MG tablet TAKE 1 TABLET(10 MG) BY MOUTH DAILY 03/10/20   [name], MD  aspirin 81 MG tablet Take 1 tablet (81 mg total) by mouth daily. 10/24/18   Costella, Vista Mink, PA-C  atorvastatin (LIPITOR) 80 MG tablet TAKE 1 TABLET(80 MG) BY MOUTH DAILY 12/10/19   [name], MD  chlordiazePOXIDE (LIBRIUM) 10 MG capsule Take 10-20 mg by mouth 3 (three) times daily as needed for anxiety.     [provider]  clopidogrel (PLAVIX) 75 MG tablet TAKE 1 TABLET BY MOUTH EVERY DAY 02/06/20   [name], MD  Doxepin HCl 6 MG TABS Take 6 mg by mouth at bedtime as needed for sleep. 02/03/19   [provider]  lisinopril (ZESTRIL) 5 MG tablet Take 5 mg by mouth daily. May take 1 additional tablet once daily for SBP greater than 150 once hour after taking daily dose 03/12/19   [name], MD  metoprolol succinate (TOPROL-XL) 25 MG 24 hr tablet Take 0.5 tablets (12.5 mg total) by mouth daily. Take with or immediately following a meal. Patient taking differently: Take 25 mg by mouth  daily. Take with or immediately following a meal. 01/23/19   [name], MD  Multiple Vitamins-Minerals (ONE-A-DAY MENS 50+ ADVANTAGE PO) Take 1 tablet by mouth daily.     [provider]  nitroGLYCERIN (NITROSTAT) 0.4 MG SL tablet PLACE 1 TABLET UNDER THE TONGUE EVERY 5 MINUTES  AS NEEDED FOR CHEST PAIN. 3 DOSES MAX Patient taking differently: Place 0.4 mg under the tongue every 5 (five) minutes as needed for chest pain. 3 DOSES MAX 08/06/19   [name], MD  pantoprazole (PROTONIX) 40 MG tablet Take 40 mg by mouth daily.    [provider]  ranolazine (RANEXA) 1000 MG SR tablet Take 1 tablet (1,000 mg total) by mouth 2 (two) times daily. 11/08/19   [name], NP    Allergies    Prednisone, Tetanus toxoids, Wellbutrin [bupropion], Morphine and related, Scallops [shellfish allergy], and Chantix [varenicline]  Review of Systems   Review of Systems  Cardiovascular: Positive for chest pain.  All other systems reviewed and are negative.   Physical Exam Updated Vital Signs BP 100/65 (BP Location: Right Arm)   Pulse 91   Temp 98.2 F (36.8 C) (Oral)   Ht 5\' 8"  (1.727 m)   Wt 88.5 kg   SpO2 97%   BMI 29.65 kg/m   Physical Exam Vitals and nursing note reviewed.  Constitutional:      Appearance: He is well-developed.  HENT:     Head: Normocephalic and atraumatic.  Eyes:     Conjunctiva/sclera: Conjunctivae normal.     Pupils: Pupils are equal, round, and reactive to light.  Cardiovascular:     Rate and Rhythm: Normal rate and regular rhythm.     Heart sounds: Normal heart sounds.  Pulmonary:     Effort: Pulmonary effort is normal.     Breath sounds: Normal breath sounds. No decreased breath sounds, wheezing or rhonchi.  Abdominal:     General: Bowel sounds are normal.     Palpations: Abdomen is soft.  Musculoskeletal:        General: Normal range of motion.     Cervical back: Normal range of motion.  Skin:    General: Skin is warm  and dry.  Neurological:     Mental Status: He is alert and oriented to person, place, and time.  Psychiatric:     Comments: Seems a bit anxious     ED Results / Procedures / Treatments   Labs (all labs ordered are listed, but only abnormal results are displayed) Labs Reviewed  BASIC METABOLIC PANEL - Abnormal; Notable for the following components:      Result Value   CO2 20 (*)    Glucose, Bld 105 (*)    BUN 25 (*)    Creatinine, Ser 1.36 (*)    GFR calc non Af Amer 54 (*)    All other components within normal limits  SARS CORONAVIRUS 2 BY RT PCR (HOSPITAL ORDER, [facility])  CBC WITH DIFFERENTIAL/PLATELET  TROPONIN I (HIGH SENSITIVITY)  TROPONIN I (HIGH SENSITIVITY)    EKG EKG Interpretation  Date/Time:  Friday April 04 2020 00:54:42 EDT Ventricular Rate:  86 PR Interval:    QRS Duration: 103 QT Interval:  366 QTC Calculation: 438 R Axis:   70 Text Interpretation: Sinus rhythm Confirmed by Randal Buba, April ([id number]) on 04/04/2020 1:08:56 AM   Radiology DG Chest Portable 1 View  Result Date: 04/04/2020 CLINICAL DATA:  Left-sided neck pain and chest pain EXAM: PORTABLE CHEST 1 VIEW COMPARISON:  11/07/2019 FINDINGS: The heart size and mediastinal contours are within normal limits. Both lungs are clear. The visualized skeletal structures are unremarkable. Remote median sternotomy. IMPRESSION: No active disease. Electronically Signed   By: Ulyses Jarred M.D.   On: 04/04/2020 01:16    Procedures  Procedures (including critical care time)  Medications Ordered in ED Medications  sodium chloride 0.9 % bolus 1,000 mL (0 mLs Intravenous Stopped 04/04/20 0246)  alum & mag hydroxide-simeth (MAALOX/MYLANTA) 200-200-20 MG/5ML suspension 30 mL (30 mLs Oral Given 04/04/20 0251)    And  lidocaine (XYLOCAINE) 2 % viscous mouth solution 15 mL (15 mLs Oral Given 04/04/20 0251)  ketorolac (TORADOL) 15 MG/ML injection 15 mg (15 mg Intravenous Given 04/04/20 0520)    ED  Course  I have reviewed the triage vital signs and the nursing notes.  Pertinent labs & imaging results that were available during my care of the patient were reviewed by me and considered in my medical decision making (see chart for details).    MDM Rules/Calculators/A&P  64 year old male presenting to the ED with chest pain.  History of MI with stents and CABG previously.  Reported some shortness of breath and feeling clammy, took 1 nitro but dropped BP so held further NTG.  No improvement with pain.  States he gets pain like this about twice a year.  Admitted March 2021 and ruled out from cardiac standpoint, felt to be GI related.  He is afebrile and nontoxic, no acute distress, hemodynamically stable.  Lungs are clear without any noted wheezes or rhonchi.  Work-up pending.  He is asking for more NTG, however blood pressure is still marginally low 97/52 so will hold further NTG right now.  Given IVF.    Initial labs reassuring, trop negative.  CXR clear.  Reports ongoing pain without change.  Given GI cocktail as BP still soft at 100/65.  Given his history, will obtain delta trop.  Delta trop negative.  Symptoms unchanged from arrival.  BP has come up nicely.  As patient without acute change here and negative work-up, feel he is stable for discharge home.  He did have negative cardiac work-up during admission earlier this year and last cath in 03/2019 was unchanged from prior.  Symptoms during last admission in March 2021 felt to be GI in nature.  Will have him follow-up with cardiology--- he was hesitant about this so I did inbox message his cardiologist for him.  He may return here for any new/acute changes.  Final Clinical Impression(s) / ED Diagnoses Final diagnoses:  Chest pain in adult    Rx / DC Orders ED Discharge Orders    None       Larene Pickett, PA-C 04/04/20 2263    Palumbo, April, MD 04/04/20 3354

## 2020-04-04 NOTE — ED Triage Notes (Signed)
Pt from home via EMS for eval of sudden onset of 7/10 central CP, radiates to L side neck, shoulder & back. Hx MI, "minor stroke," stent placed. Pt endorsing nausea, 4mg  zofran given en route did not aid. Pt took 324asa prior to arrival of EMS, given 0.4NTG en route that temporarily helped. Pt did not take home dose of nitro due to "low BP," given 440mL NS via EMS, last BP 98/64. EKG unremarkable, 20# L AC

## 2020-04-04 NOTE — Discharge Instructions (Signed)
Cardiac work-up today was reassuring. I messaged Dr. Scarlette Calico about your visit to help facilitate some follow-up. I recommend that you follow-up with him in clinic. Return here for any new/acute changes.

## 2020-04-07 ENCOUNTER — Telehealth: Payer: Self-pay

## 2020-04-07 IMAGING — DX DG CHEST 2V
2 series · 2 of 2 positions shown · non-contrast
Comparison: 05/15/2018

CLINICAL DATA: Chest pain

EXAM:
CHEST - 2 VIEW

[chest ap]
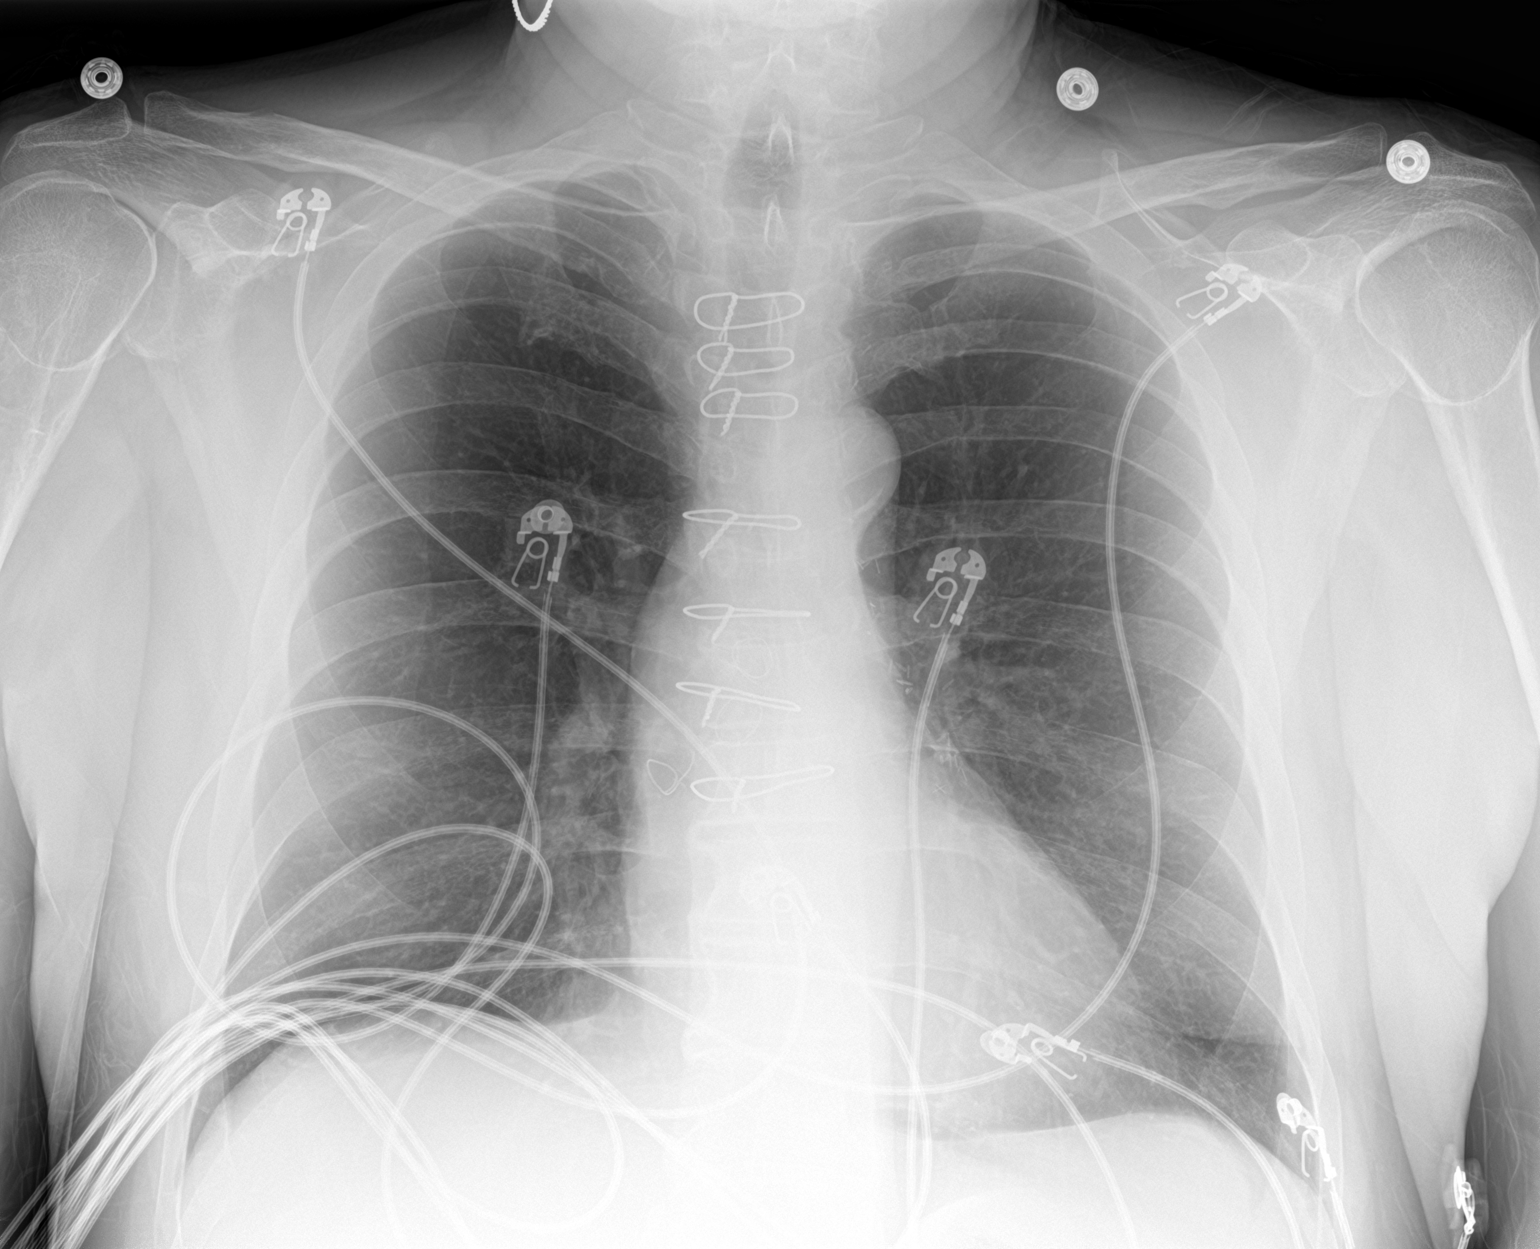

[chest lat]
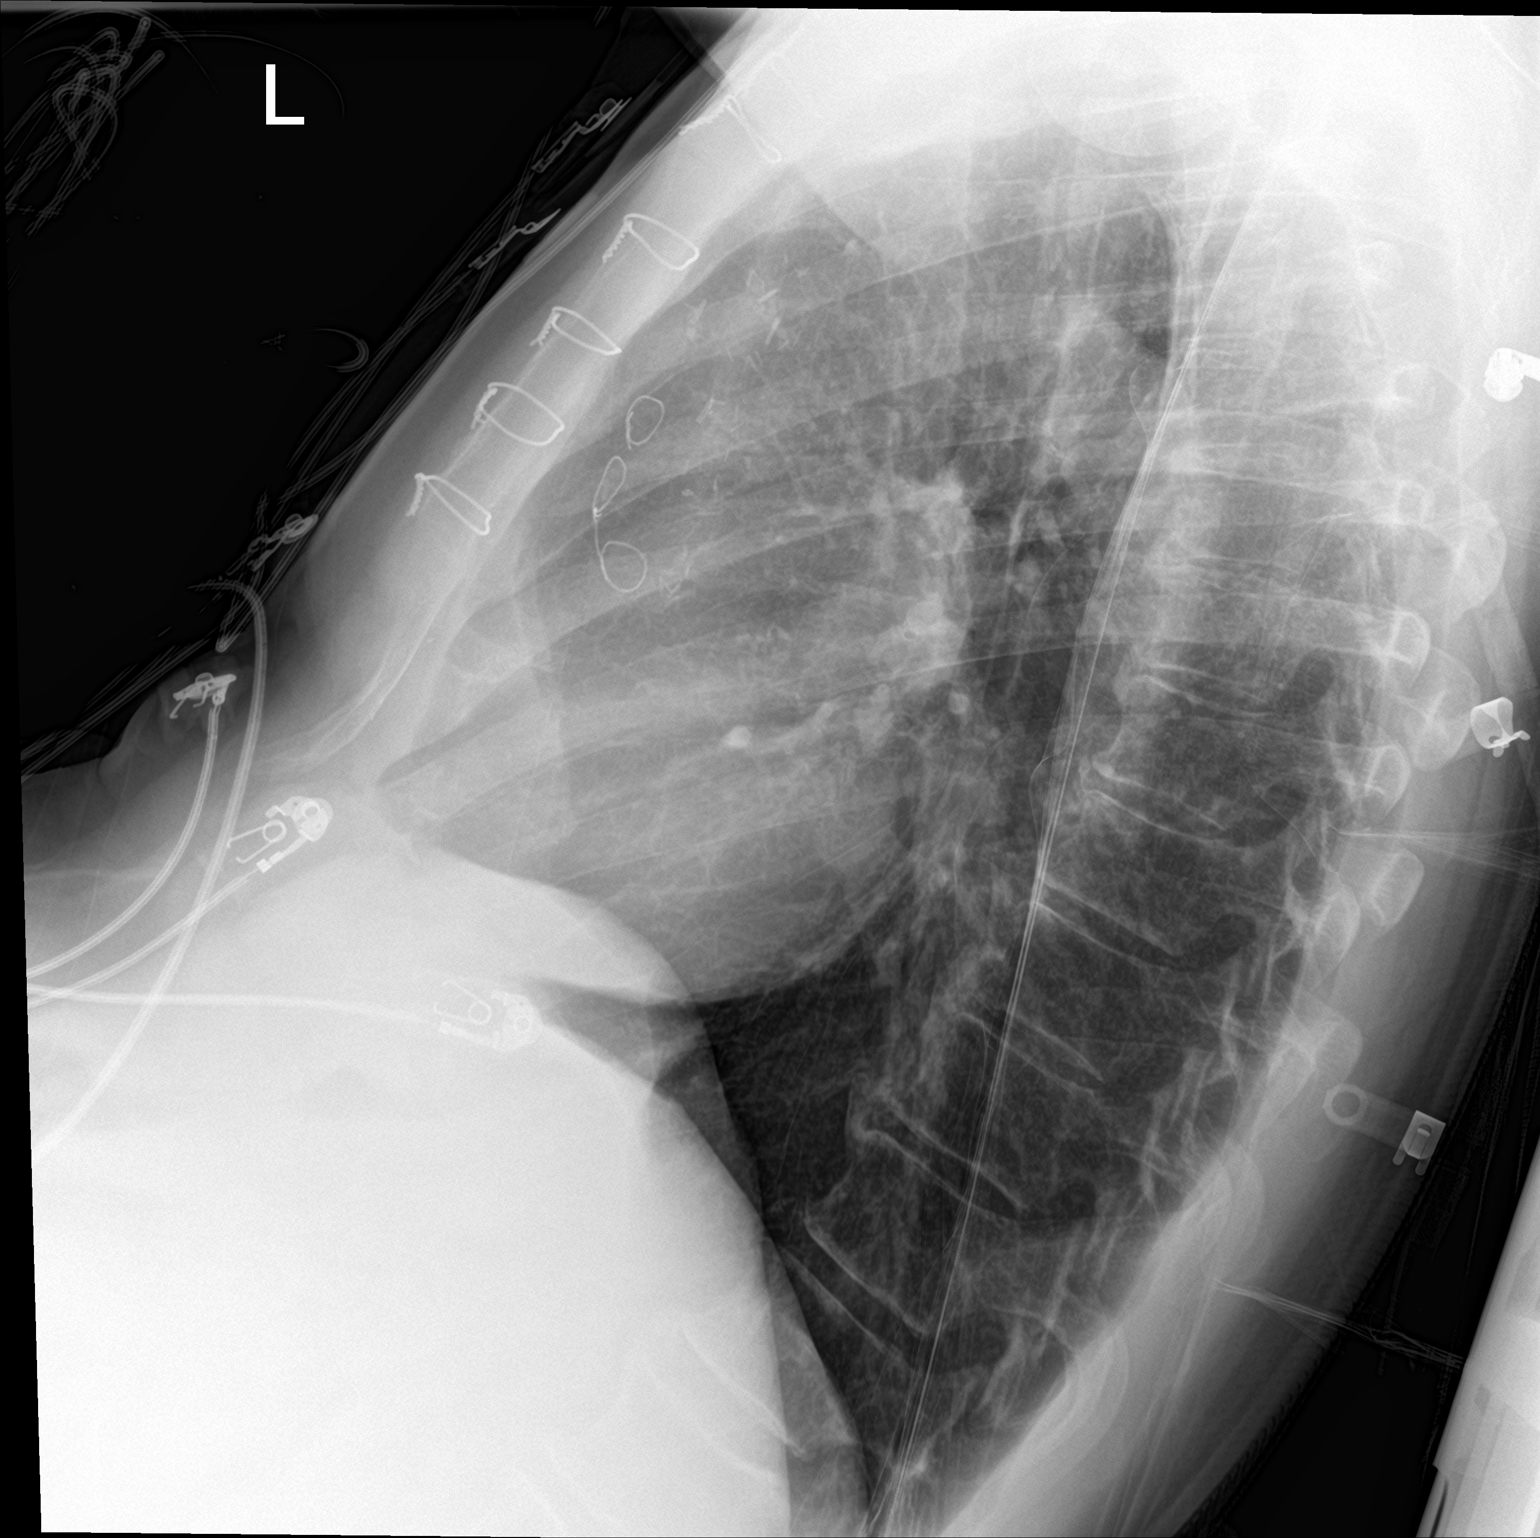

[2 of 2 positions shown; findings below may reference images not displayed]

FINDINGS: Prior CABG. Heart and mediastinal contours are within normal limits.
No focal opacities or effusions. No acute bony abnormality.
IMPRESSION: No active cardiopulmonary disease.

## 2020-04-07 NOTE — Telephone Encounter (Signed)
Left message to call back  

## 2020-04-07 NOTE — Telephone Encounter (Signed)
-----   Message from [name], MD sent at 04/05/2020  4:19 PM EDT ----- Thanks Lattie Haw.  We will get him in for followup.   JV ----- Message ----- From: Larene Pickett, PA-C Sent: 04/04/2020   5:57 AM EDT To: [name], MD  Hey Dr. Irean Hong this patient of yours in the ER.  Sounds like his usual symptoms with chest pain but neg trop x2, baseline EKG for him, CXR clear, covid negative.  Didn't sound like PE.  We watched him for a while without changes.  Given he had recent admission in march and ruled out and last cath didn't look much different from prior, felt he was ok for discharge.  He was very concerned so told him I would message to facilitate some follow-up.  Not sure if there is a little component of anxiety going on which is reasonable given his prior history.  Let me know if any issues/concerns.  Lattie Haw

## 2020-04-16 MED ORDER — NITROGLYCERIN 0.4 MG SL SUBL: SUBLINGUAL_TABLET | SUBLINGUAL | 4 refills | Status: DC

## 2020-04-16 NOTE — Telephone Encounter (Signed)
Patient scheduled for 8/24 at 2:40 PM. Refill sent in for NTG.

## 2020-04-21 IMAGING — CT CT HEAD W/O CM
4 series · 15 of 47 positions shown, 17 images · non-contrast
Comparison: None.

CLINICAL DATA: Headache for 2 days.  Facial numbness.

EXAM:
CT HEAD WITHOUT CONTRAST
TECHNIQUE: Contiguous axial images were obtained from the base of the skull
through the vertex without intravenous contrast.

[Series 3: head without · axial · non-contrast · 0.48mm/px · z∈[-103,+37]mm · 7 of 38 slices shown, 9 images]
[im 5/38  brain]
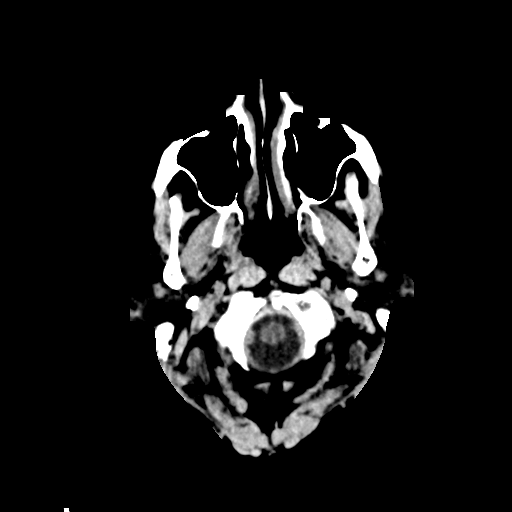
[im 5/38  bone]
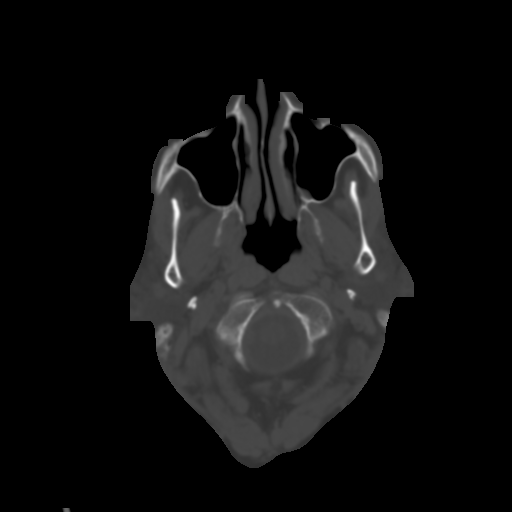
[im 10/38  brain]
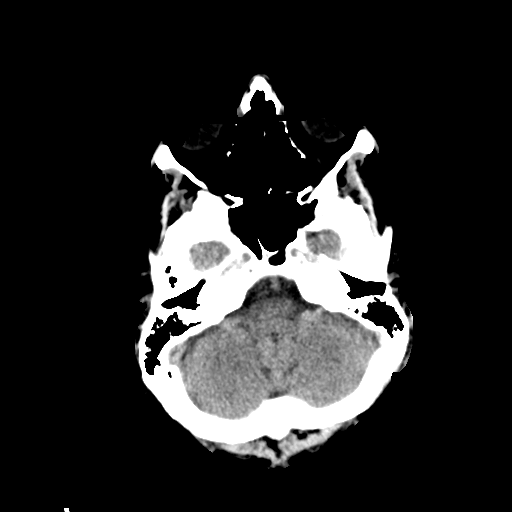
[im 14/38  brain]
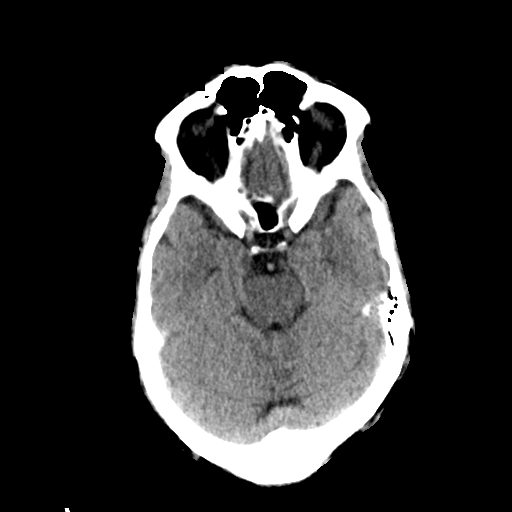
[im 19/38  brain]
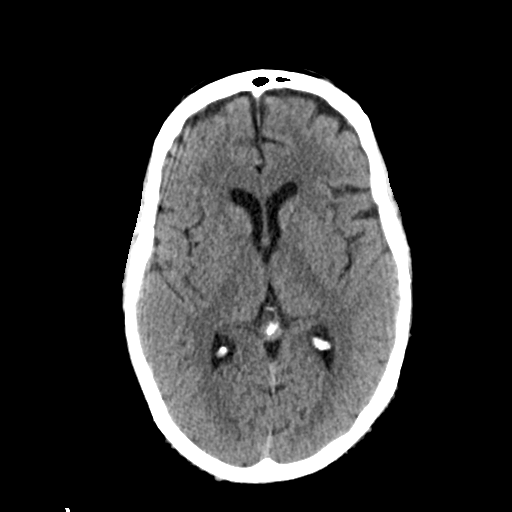
[im 24/38  brain]
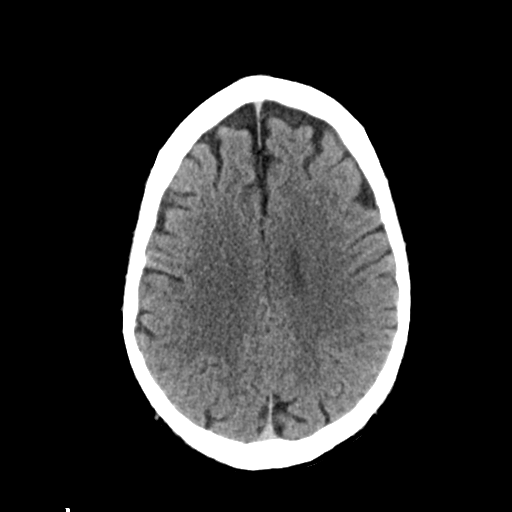
[im 24/38  bone]
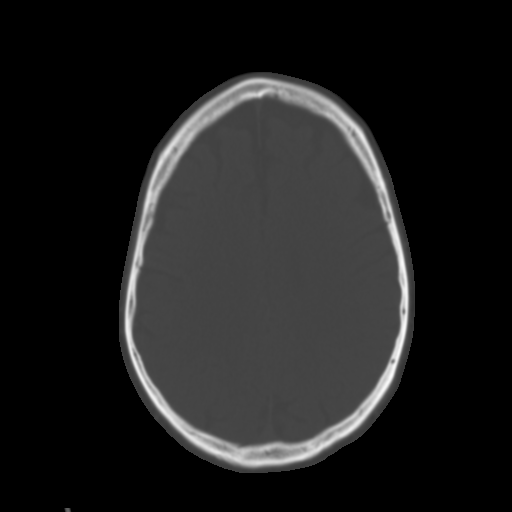
[im 28/38  brain]
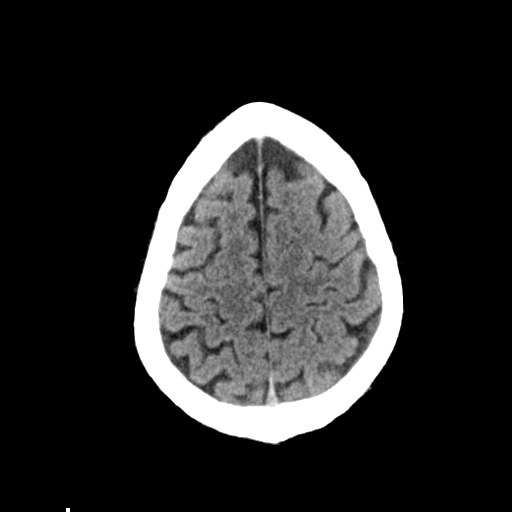
[im 33/38  brain]
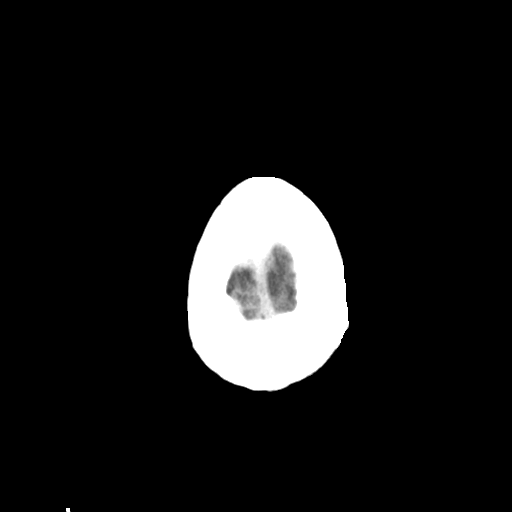

[Series 4: head bone · axial · 0.48mm/px · z∈[-105,-87]mm · 2 of 94 slices shown]
[im 10/94  bone]
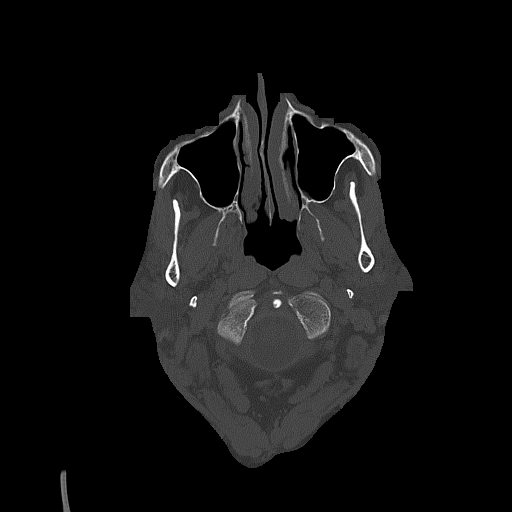
[im 19/94  bone]
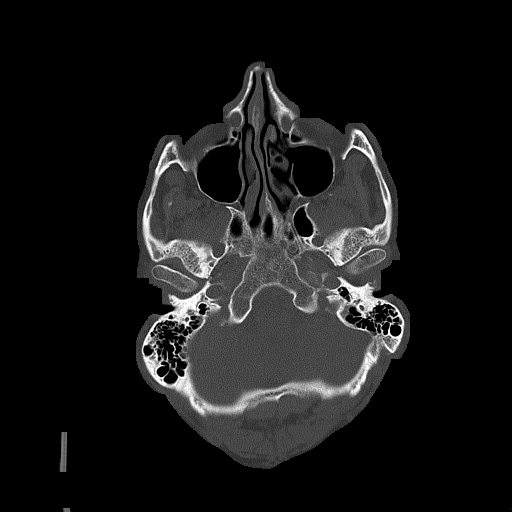

[Series 5: head without cor · coronal · non-contrast · 0.36mm/px · 3 of 81 slices shown]
[im 27/81  brain]
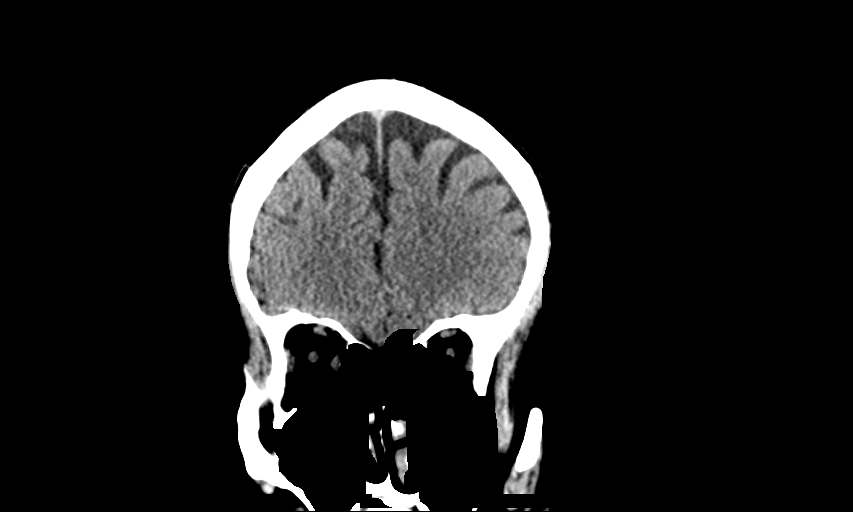
[im 36/81  brain]
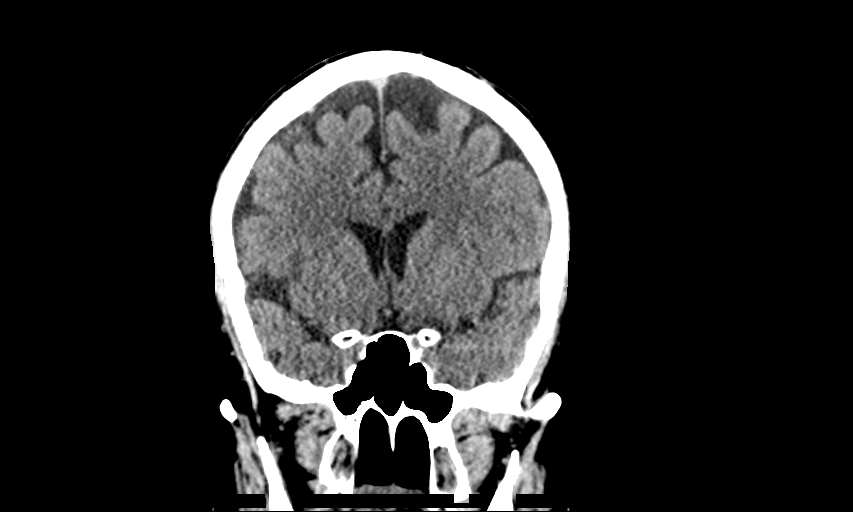
[im 45/81  brain]
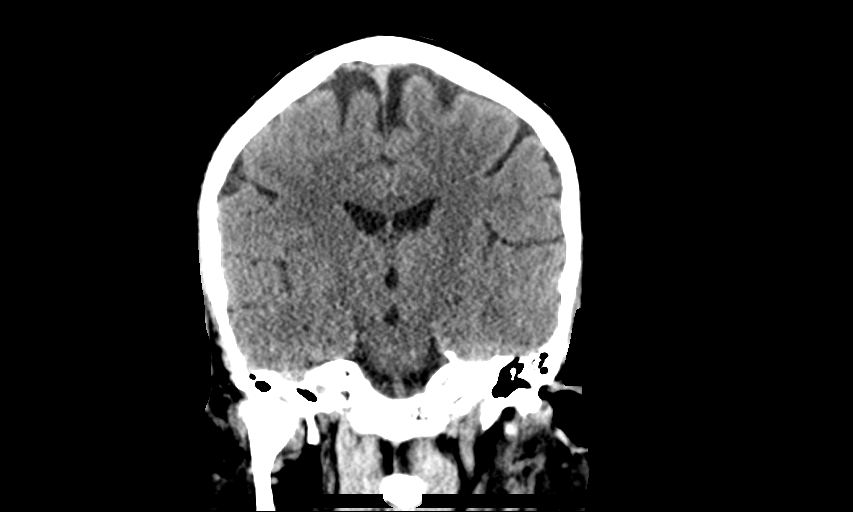

[Series 6: head without sag · sagittal · non-contrast · 0.44mm/px · 3 of 67 slices shown]
[im 23/67  brain]
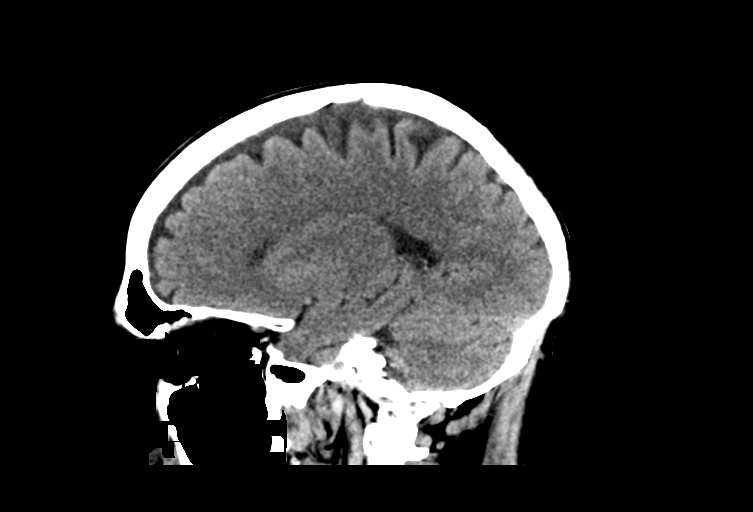
[im 34/67  brain]
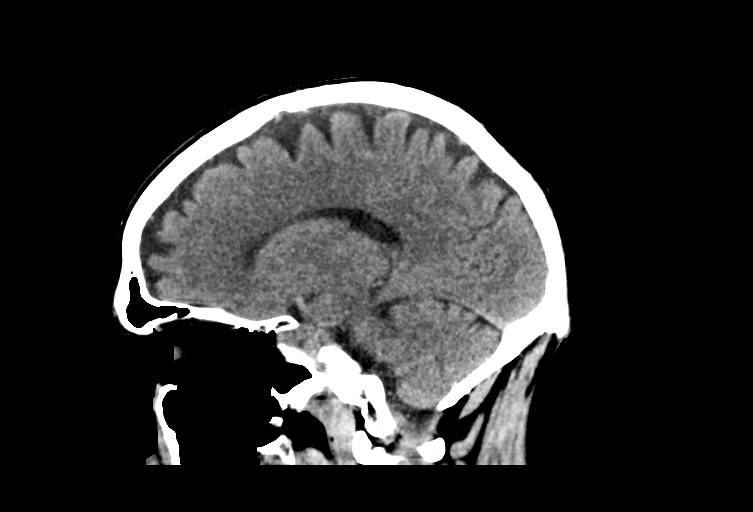
[im 45/67  brain]
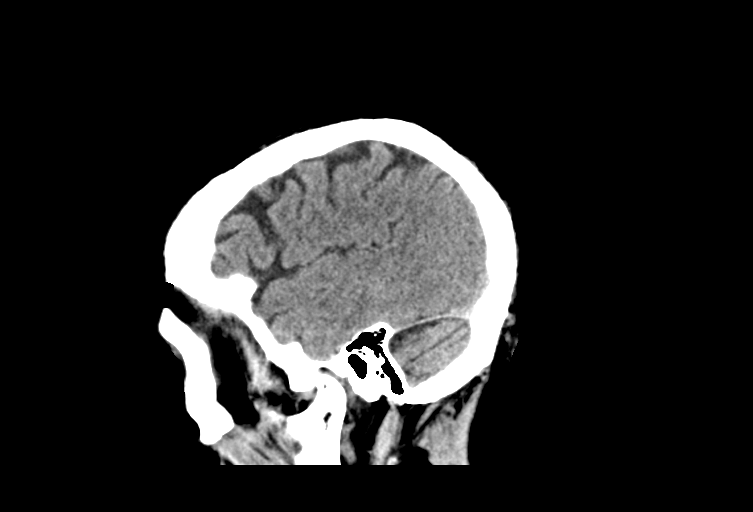

[15 of 47 positions shown; findings below may reference images not displayed]

FINDINGS: Brain: No evidence of acute infarction, hemorrhage, hydrocephalus,
extra-axial collection or mass lesion/mass effect.

Vascular: No hyperdense vessel or unexpected calcification.

Skull: Normal. Negative for fracture or focal lesion.

Sinuses/Orbits: No acute finding.

Other: None
IMPRESSION: 1. Normal brain for age.

## 2020-04-22 ENCOUNTER — Ambulatory Visit: Payer: Medicare Other | Admitting: Interventional Cardiology

## 2020-05-05 NOTE — Progress Notes (Signed)
Virtual Visit via Telephone Note   This visit type was conducted due to national recommendations for restrictions regarding the COVID-19 Pandemic (e.g. social distancing) in an effort to limit this patient's exposure and mitigate transmission in our community.  Due to his co-morbid illnesses, this patient is at least at moderate risk for complications without adequate follow up.  This format is felt to be most appropriate for this patient at this time.  The patient did not have access to video technology/had technical difficulties with video requiring transitioning to audio format only (telephone).  All issues noted in this document were discussed and addressed.  No physical exam could be performed with this format.  Please refer to the patient's chart for his  consent to telehealth for [facility].    Date:  05/06/2020   ID:  [name], DOB 06-Oct-1955, MRN [id number] The patient was identified using 2 identifiers.  Patient Location: Home Provider Location: Office/Clinic      Cardiology Office Note   Date:  05/06/2020   ID:  Anthony, Villa August 28, 1956, MRN [id number]  PCP:  [name], MD    No chief complaint on file.  CAD  Wt Readings from Last 3 Encounters:  05/06/20 195 lb (88.5 kg)  04/04/20 195 lb (88.5 kg)  11/08/19 203 lb 14.4 oz (92.5 kg)       History of Present Illness: [name] is a 64 y.o. male  with CAD, s/p anterior STEMI and emergent CABG in 2017.   He has had multiple episodes of chest pain since then and multiple cathsin 2018 and a stress test not resulting in PCI. He admits to having had some anxiety as well. Medications have been difficult to tolerate for his anxiety at times.   In 2/19, he had a cath and PCI of the RCA due to narrowing of the graft to the distal RCA.  Seen 02/2018 for presyncope not felt to be cardiac in origin.   Patient was in the ER 06/09/2018 with palpitations and heart rate up to  120 but normal when he arrived. Also had atypical chest pain anxiety was felt to be a component and he requested narcotics for relief. Event monitor 06/13/2018 normal sinus rhythm with occasional sinus bradycardia and sinus tachycardia. No atrial fibrillation or pathologic arrhythmias.  Patient back in the ER 09/28/2018 for atypical chest pain that was reproducible. 2D echo normal LVEF 55 to 60% with no valvular problems. CTA no evidence of aortic dissection, mild prominence of the ascending aorta at 3.9 x 3.9 cm.  In 09/2018, Patient had C7-T1 anterior cervical fusion done by [name] with [facility] neurosurgery and spine.  Since the RCA stent, he felt much better.  He developed more chest pain in 03/2019 which led to a cath:  Mid LAD lesion is 55% stenosed.  Ost LAD to Prox LAD lesion is 50% stenosed. LIMA to LAD is patent.  Ost Ramus lesion is 90% stenosed.  Ramus lesion is 75% stenosed.  Ost Cx lesion is 80% stenosed.  Prox Cx to Mid Cx lesion is 50% stenosed. SVG to OM is patent.  Dist RCA lesion is 50% stenosed.  RPDA lesion is 40% stenosed. SVG to PDA is patent with proximal disease that is unchanged.  Previously placed Ost RCA to Prox RCA drug eluting stent is widely patent.  Graft to the diagonal was occluded. There was TIMI 3 flow in the small diagonal with proximal ectasia.  The left ventricular systolic function  is normal.  LV end diastolic pressure is normal. LVEDP 11 mm Hg.  The left ventricular ejection fraction is 50-55% by visual estimate.  There is no aortic valve stenosis.  Continue medical therapy.   No new PCI was done.   Since the last visit, he has had some GI issues.  He has had occasional high BP readings at a physical with his MD.  He has not taken many additional doses of blood pressure.      Past Medical History:  Diagnosis Date  . Anxiety   . Arthritis   . Basal cell carcinoma (BCC) of forehead   . CAD (coronary artery  disease)    a. 10/2015 ant STEMI >> LHC with 3 v CAD; oLAD tx with POBA >> emergent CABG. b. Multiple evals since that time, early graft failure of SVG-RCA by cath 03/2016. c. 2/19 PCI/DES x1 to pRCA, normal EF.  . Carotid artery disease ([place])    a. 40-59% BICA 02/2018.  Marland Kitchen Depression   . Dyspnea   . Ectopic atrial tachycardia ([place])   . Esophageal reflux    eosinophil esophagitis  . Family history of adverse reaction to anesthesia    "sister has PONV" (06/21/2017)  . Former tobacco use   . Gout   . Hepatitis C    "treated and cured" (06/21/2017)  . High cholesterol   . History of kidney stones   . Hypertension   . Ischemic cardiomyopathy    a. EF 25-30% at intraop TEE 4/17  //  b. Limited Echo 5/17 - EF 45-50%, mild ant HK. c. EF 55-65% by cath 09/2017.  . Migraine    "3-4/yr" (06/21/2017)  . Myocardial infarction ([place]) 10/2015  . Palpitations   . Sinus bradycardia    a. HR dropping into 40s in 02/2016 -> BB reduced.  . Stroke ([place]) 10/2016   "small one; sometimes my memory/cognitive issues" (06/21/2017)  . Symptomatic hypotension    a. 02/2016 ER visit -> meds reduced.  . Syncope     Past Surgical History:  Procedure Laterality Date  . ANTERIOR CERVICAL DECOMP/DISCECTOMY FUSION N/A 10/17/2018   Procedure: Anterior Cervical Decompression Fusion - Cervical seven -Thoracic one;  Surgeon: Consuella Lose, MD;  Location: [place];  Service: Neurosurgery;  Laterality: N/A;  . BASAL CELL CARCINOMA EXCISION     "forehead  . BIOPSY  07/20/2019   Procedure: BIOPSY;  Surgeon: Carol Ada, MD;  Location: [facility];  Service: Endoscopy;;  . CARDIAC CATHETERIZATION N/A 11/28/2015   Procedure: Left Heart Cath and Coronary Angiography;  Surgeon: [name], MD;  Location: [place] [facility];  Service: Cardiovascular;  Laterality: N/A;  . CARDIAC CATHETERIZATION N/A 11/28/2015   Procedure: Coronary Balloon Angioplasty;  Surgeon: [name], MD;  Location: [place] [facility];   Service: Cardiovascular;  Laterality: N/A;  ostial LAD  . CARDIAC CATHETERIZATION N/A 11/28/2015   Procedure: Coronary/Graft Angiography;  Surgeon: [name], MD;  Location: [place] [facility];  Service: Cardiovascular;  Laterality: N/A;  coronaries only   . CARDIAC CATHETERIZATION N/A 04/21/2016   Procedure: Left Heart Cath and Coronary Angiography;  Surgeon: Wellington Hampshire, MD;  Location: [place] [facility];  Service: Cardiovascular;  Laterality: N/A;  . CARDIAC CATHETERIZATION N/A 06/14/2016   Procedure: Left Heart Cath and Cors/Grafts Angiography;  Surgeon: Lorretta Harp, MD;  Location: [place] [facility];  Service: Cardiovascular;  Laterality: N/A;  . CARDIAC CATHETERIZATION N/A 09/08/2016   Procedure: Left Heart  Cath and Cors/Grafts Angiography;  Surgeon: Wellington Hampshire, MD;  Location: [place] [facility];  Service: Cardiovascular;  Laterality: N/A;  . CARDIAC CATHETERIZATION    . CORONARY ARTERY BYPASS GRAFT N/A 11/28/2015   Procedure: CORONARY ARTERY BYPASS GRAFTING (CABG) TIMES FIVE USING LEFT INTERNAL MAMMARY ARTERY AND RIGHT GREATER SAPHENOUS,VIEN HARVEATED BY ENDOVIEN, INTRAOPPRATIVE TEE;  Surgeon: Gaye Pollack, MD;  Location: [place];  Service: Open Heart Surgery;  Laterality: N/A;  . CORONARY STENT INTERVENTION N/A 10/05/2017   Procedure: CORONARY STENT INTERVENTION;  Surgeon: [name], MD;  Location: [place] [facility];  Service: Cardiovascular;  Laterality: N/A;  . ESOPHAGOGASTRODUODENOSCOPY (EGD) WITH PROPOFOL N/A 07/20/2019   Procedure: ESOPHAGOGASTRODUODENOSCOPY (EGD) WITH PROPOFOL;  Surgeon: Carol Ada, MD;  [facility]: [facility];  Service: Endoscopy;  Laterality: N/A;  . HUMERUS SURGERY Right 1969   "tumor inside bone; filled it w/bone chips"  . LEFT HEART CATH AND CORS/GRAFTS ANGIOGRAPHY N/A 03/11/2017   Procedure: Left Heart Cath and Cors/Grafts Angiography;  Surgeon: Leonie Man, MD;  Location: [place] [facility];  Service: Cardiovascular;   Laterality: N/A;  . LEFT HEART CATH AND CORS/GRAFTS ANGIOGRAPHY N/A 10/05/2017   Procedure: LEFT HEART CATH AND CORS/GRAFTS ANGIOGRAPHY;  Surgeon: [name], MD;  Location: [place] [facility];  Service: Cardiovascular;  Laterality: N/A;  . LEFT HEART CATH AND CORS/GRAFTS ANGIOGRAPHY N/A 04/11/2019   Procedure: LEFT HEART CATH AND CORS/GRAFTS ANGIOGRAPHY;  Surgeon: [name], MD;  Location: [place] [facility];  Service: Cardiovascular;  Laterality: N/A;  . PERIPHERAL VASCULAR CATHETERIZATION N/A 06/14/2016   Procedure: Lower Extremity Angiography;  Surgeon: Lorretta Harp, MD;  Location: [place] [facility];  Service: Cardiovascular;  Laterality: N/A;     Current Outpatient Medications  Medication Sig Dispense Refill  . acetaminophen (TYLENOL) 500 MG tablet Take 1,000 mg by mouth every 6 (six) hours as needed for mild pain or headache.     Marland Kitchen amLODipine (NORVASC) 10 MG tablet TAKE 1 TABLET(10 MG) BY MOUTH DAILY 90 tablet 1  . aspirin 81 MG tablet Take 1 tablet (81 mg total) by mouth daily. 30 tablet   . atorvastatin (LIPITOR) 80 MG tablet TAKE 1 TABLET(80 MG) BY MOUTH DAILY 90 tablet 2  . chlordiazePOXIDE (LIBRIUM) 10 MG capsule Take 10-20 mg by mouth 3 (three) times daily as needed for anxiety.     . cloNIDine (CATAPRES) 0.1 MG tablet Take 0.1 mg by mouth at bedtime.    . clopidogrel (PLAVIX) 75 MG tablet TAKE 1 TABLET BY MOUTH EVERY DAY (Patient taking differently: Take 75 mg by mouth every evening. ) 90 tablet 1  . lisinopril (ZESTRIL) 5 MG tablet Take 5 mg by mouth daily. May take 1 additional tablet once daily for SBP greater than 150 once hour after taking daily dose (Patient taking differently: Take 5 mg by mouth every evening. May take 1 additional tablet once daily for SBP greater than 150 once hour after taking daily dose) 135 tablet 2  . metoprolol succinate (TOPROL-XL) 25 MG 24 hr tablet Take 25 mg by mouth daily.    . Multiple Vitamins-Minerals (ONE-A-DAY MENS 50+  ADVANTAGE PO) Take 1 tablet by mouth daily.     . nitroGLYCERIN (NITROSTAT) 0.4 MG SL tablet PLACE 1 TABLET UNDER THE TONGUE EVERY 5 MINUTES AS NEEDED FOR CHEST PAIN. 3 DOSES MAX 25 tablet 4  . pantoprazole (PROTONIX) 40 MG tablet Take 40 mg by mouth every evening.     Marland Kitchen  ranolazine (RANEXA) 1000 MG SR tablet Take 1 tablet (1,000 mg total) by mouth 2 (two) times daily. (Patient taking differently: Take 1,000 mg by mouth daily. ) 180 tablet 0   No current facility-administered medications for this visit.    Allergies:   Prednisone, Tetanus toxoids, Wellbutrin [bupropion], Morphine and related, Scallops [shellfish allergy], and Chantix [varenicline]    Social History:  The patient  reports that he quit smoking about 4 years ago. His smoking use included cigarettes. He has a 33.00 pack-year smoking history. He has never used smokeless tobacco. He reports current alcohol use. He reports that he does not use drugs.   Family History:  The patient's family history includes Heart Problems in his father; Heart attack in his maternal grandmother and paternal uncle; Heart attack (age of onset: 39) in his father; Heart failure in his father; Hypertension in his brother; Lung cancer in his mother; Stroke in his father and maternal grandmother.    ROS:  Please see the history of present illness.   Otherwise, review of systems are positive for GI upset.   All other systems are reviewed and negative.    PHYSICAL EXAM: VS:  BP (!) 164/94   Pulse 74   Ht 5\' 8"  (1.727 m)   Wt 195 lb (88.5 kg)   BMI 29.65 kg/m  , BMI Body mass index is 29.65 kg/m. GEN:  in no acute distress    Respiratory:  No shortness of breath, Neuro:  Alert and oriented x 3 Psych: euthymic mood, full affect  Exam limited by phone visit   Recent Labs: 11/07/2019: ALT 40; B Natriuretic Peptide 26.5 04/04/2020: BUN 25; Creatinine, Ser 1.36; Hemoglobin 14.2; Platelets 223; Potassium 3.9; Sodium 137   Lipid Panel    Component Value  Date/Time   CHOL 160 11/08/2019 0432   CHOL 114 11/08/2017 0833   TRIG 153 (H) 11/08/2019 0432   HDL 40 (L) 11/08/2019 0432   HDL 52 11/08/2017 0833   CHOLHDL 4.0 11/08/2019 0432   VLDL 31 11/08/2019 0432   LDLCALC 89 11/08/2019 0432   LDLCALC 43 11/08/2017 0833     Other studies Reviewed: Additional studies/ records that were reviewed today with results demonstrating: labs reviewed.   ASSESSMENT AND PLAN:  1.   CAD/chest pain: No sx like prior angina.  Continue aggressive secondary prevention.   2.   Old MI: No CHF sx. 3.   Hyperlipidemia:  LDL 89.  The current medical regimen is effective;  continue present plan and medications. 4.   HTN: COntinue lisnopril daily along with prn lisinopril.  If this prn use becomes frequent, may have to change the dose.    Current medicines are reviewed at length with the patient today.  The patient concerns regarding his medicines were addressed.  The following changes have been made:  No change  Labs/ tests ordered today include:  No orders of the defined types were placed in this encounter.   Recommend 150 minutes/week of aerobic exercise Low fat, low carb, high fiber diet recommended  Disposition:   FU in 6 months   Signed, Larae Grooms, MD  05/06/2020 11:11 AM    [facility] [place], [place], [place]  [address] Phone: [phone]; Fax: [phone]

## 2020-05-06 ENCOUNTER — Telehealth (INDEPENDENT_AMBULATORY_CARE_PROVIDER_SITE_OTHER): Payer: Medicare Other | Admitting: Interventional Cardiology

## 2020-05-06 ENCOUNTER — Encounter: Payer: Self-pay | Admitting: Interventional Cardiology

## 2020-05-06 VITALS — BP 164/94 | HR 74 | Ht 68.0 in | Wt 195.0 lb

## 2020-05-06 DIAGNOSIS — I25119 Atherosclerotic heart disease of native coronary artery with unspecified angina pectoris: Secondary | ICD-10-CM | POA: Diagnosis not present

## 2020-05-06 DIAGNOSIS — I252 Old myocardial infarction: Secondary | ICD-10-CM | POA: Diagnosis not present

## 2020-05-06 DIAGNOSIS — E785 Hyperlipidemia, unspecified: Secondary | ICD-10-CM | POA: Diagnosis not present

## 2020-05-06 DIAGNOSIS — I200 Unstable angina: Secondary | ICD-10-CM

## 2020-05-06 DIAGNOSIS — I10 Essential (primary) hypertension: Secondary | ICD-10-CM

## 2020-05-06 NOTE — Patient Instructions (Signed)
Medication Instructions:  Your physician recommends that you continue on your current medications as directed. Please refer to the Current Medication list given to you today.  Continue to use your AS NEEDED lisinopril for elevated Blood Pressures  *If you need a refill on your cardiac medications before your next appointment, please call your pharmacy*   Lab Work: None  If you have labs (blood work) drawn today and your tests are completely normal, you will receive your results only by: Marland Kitchen MyChart Message (if you have MyChart) OR . A paper copy in the mail If you have any lab test that is abnormal or we need to change your treatment, we will call you to review the results.   Testing/Procedures: None  Follow-Up: At [facility], you and your health needs are our priority.  As part of our continuing mission to provide you with exceptional heart care, we have created designated Provider Care Teams.  These Care Teams include your primary Cardiologist (physician) and Advanced Practice Providers (APPs -  Physician Assistants and Nurse Practitioners) who all work together to provide you with the care you need, when you need it.  We recommend signing up for the patient portal called "MyChart".  Sign up information is provided on this After Visit Summary.  MyChart is used to connect with patients for Virtual Visits (Telemedicine).  Patients are able to view lab/test results, encounter notes, upcoming appointments, etc.  Non-urgent messages can be sent to your provider as well.   To learn more about what you can do with MyChart, go to [email or web address].    Your next appointment:   6 month(s)  The format for your next appointment:   In Person  Provider:   You may see Larae Grooms, MD or one of the following Advanced Practice Providers on your designated Care Team:    Melina Copa, PA-C  Ermalinda Barrios, PA-C    Other Instructions None

## 2020-05-11 IMAGING — CT CT ABD-PELV W/ CM
3 of 5 series · 16 of 46 positions shown, 18 images · IV contrast (APPLIED)
Comparison: CT of the abdomen and pelvis on 07/09/2014

CLINICAL DATA: Low abd pain, blood in stool x 2 3 days No prior abd
surgery

EXAM:
CT ABDOMEN AND PELVIS WITH CONTRAST
TECHNIQUE: Multidetector CT imaging of the abdomen and pelvis was performed
using the standard protocol following bolus administration of
intravenous contrast.
CONTRAST:  100mL [id number] IOPAMIDOL ([id number]) INJECTION 76%

[Series 3: abdomen 5.0 · axial · 0.90mm/px · z∈[+752,+1142]mm · 11 of 94 slices shown, 13 images]
[im 8/94  soft-tissue]
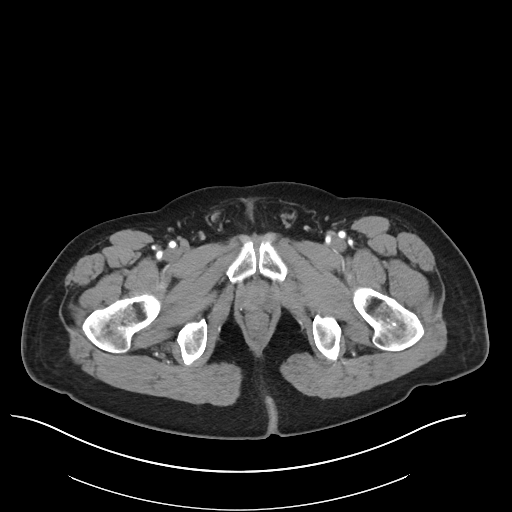
[im 8/94  bone]
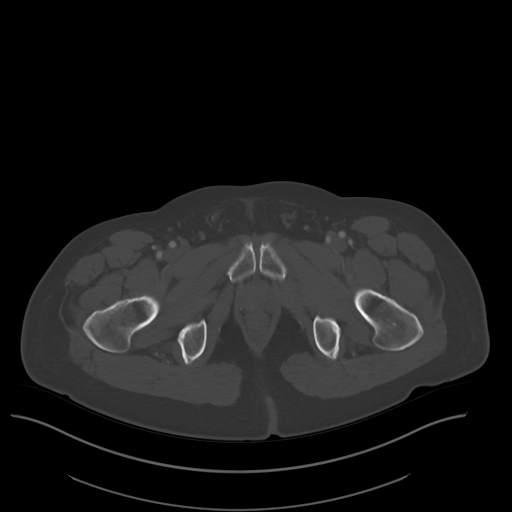
[im 16/94  soft-tissue]
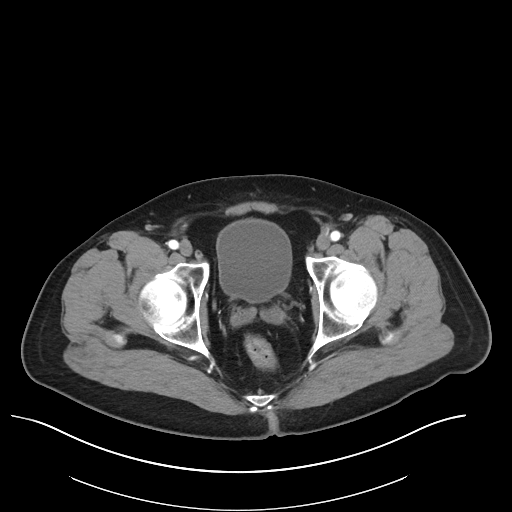
[im 24/94  soft-tissue]
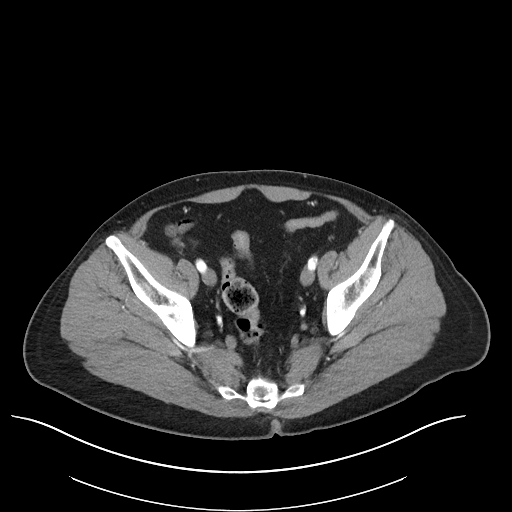
[im 32/94  soft-tissue]
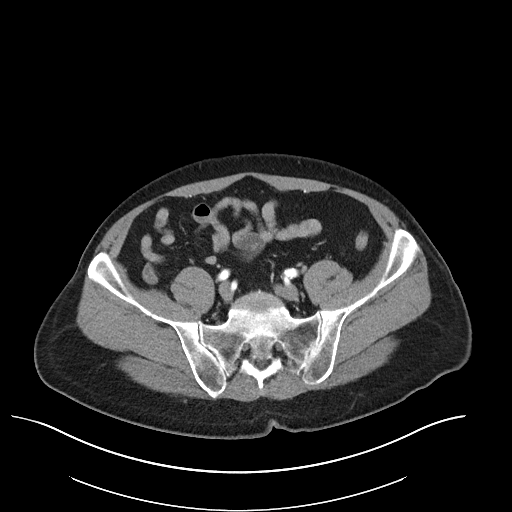
[im 39/94  soft-tissue]
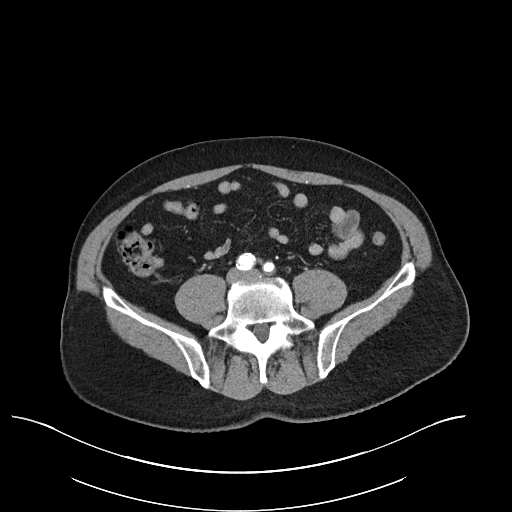
[im 47/94  soft-tissue]
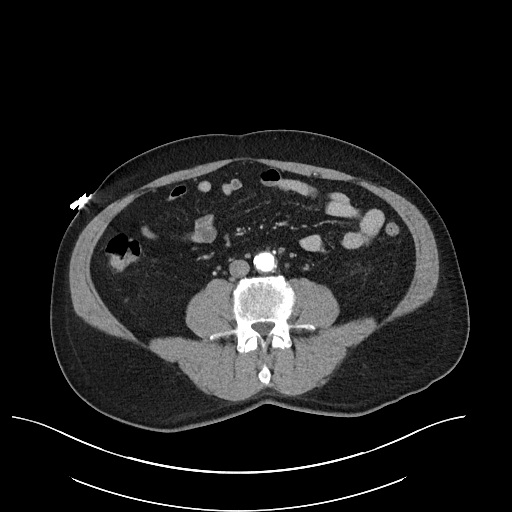
[im 55/94  soft-tissue]
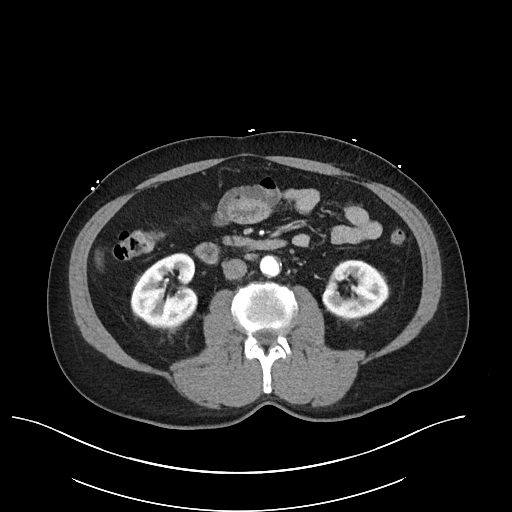
[im 63/94  soft-tissue]
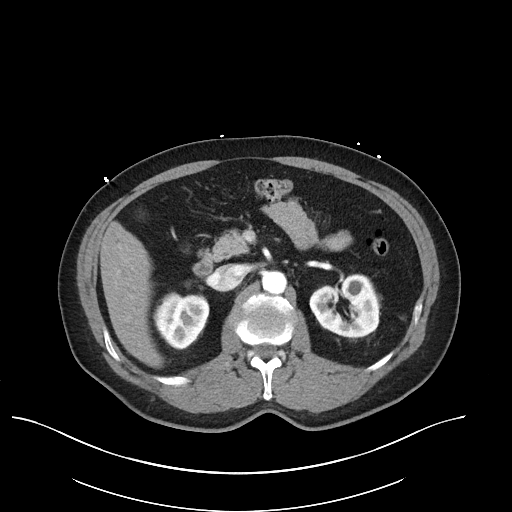
[im 70/94  soft-tissue]
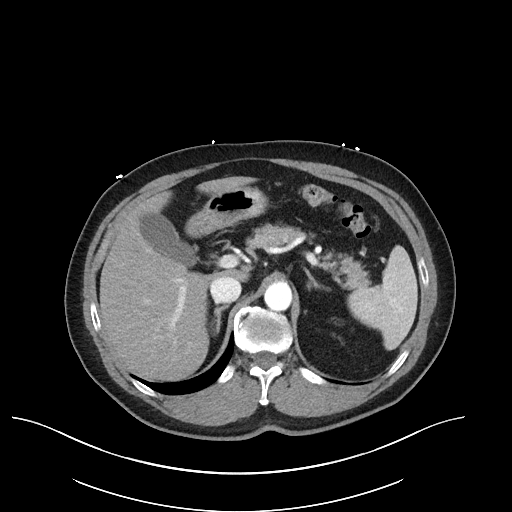
[im 70/94  bone]
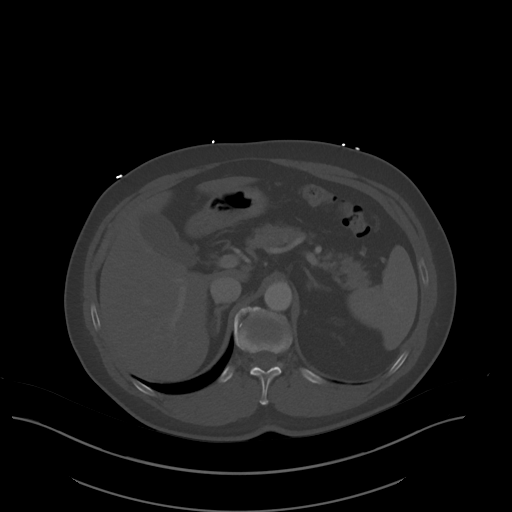
[im 78/94  soft-tissue]
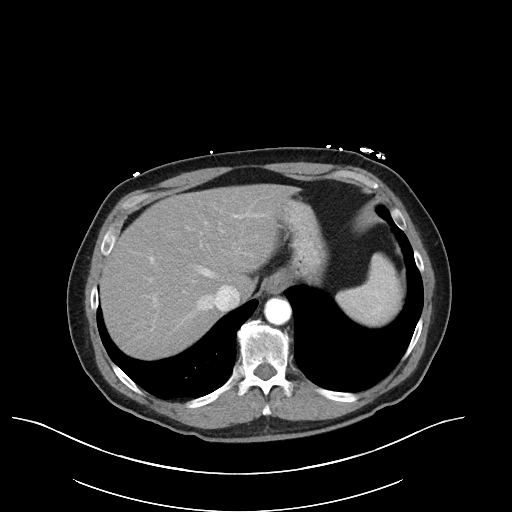
[im 86/94  soft-tissue]
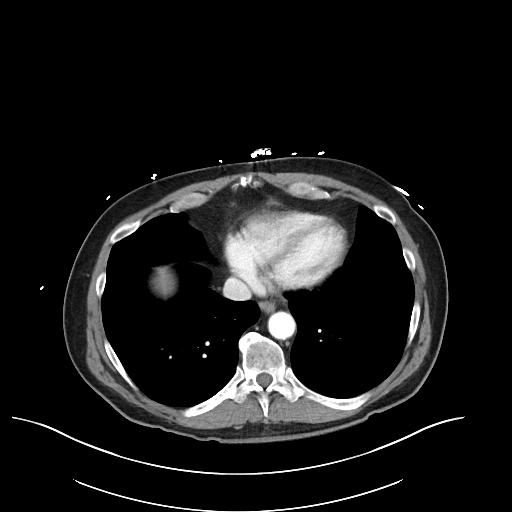

[Series 5: lung · axial · 0.90mm/px · z∈[+1004,+1034]mm · 2 of 97 slices shown]
[im 8/97  bone]
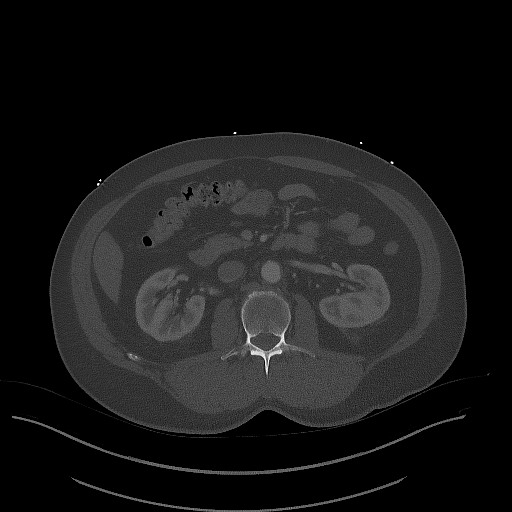
[im 23/97  bone]
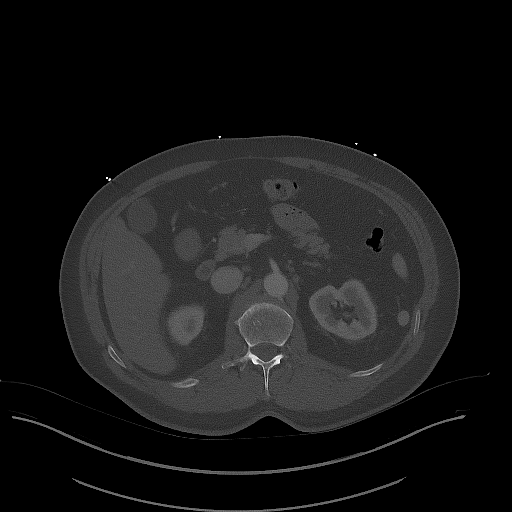

[Series 6: abdomen 3.0 mpr cor · coronal · 0.76mm/px · 3 of 101 slices shown]
[im 34/101  soft-tissue]
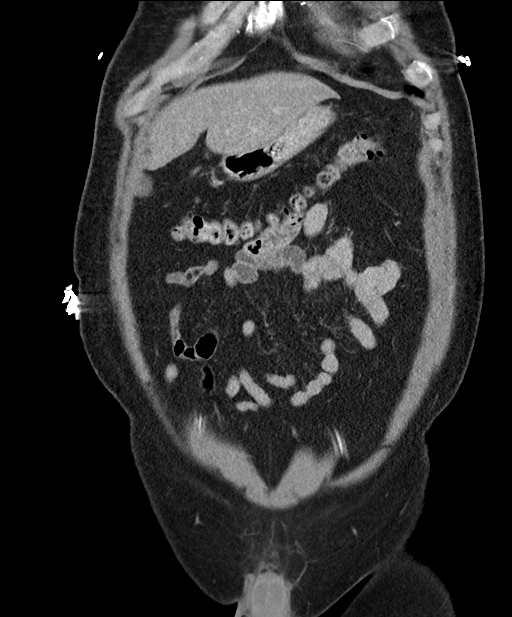
[im 45/101  soft-tissue]
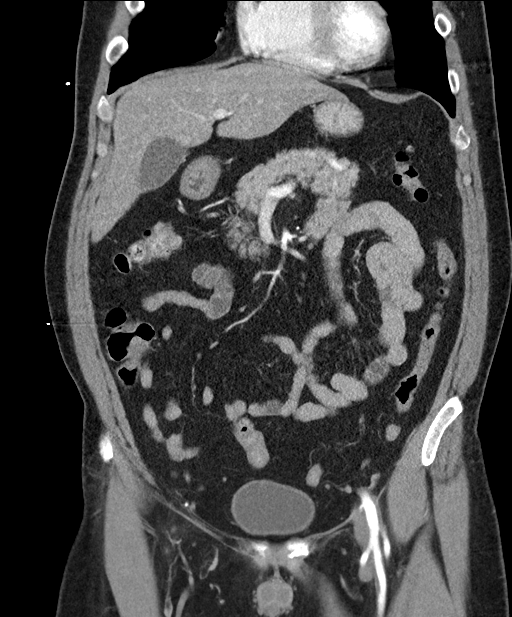
[im 56/101  soft-tissue]
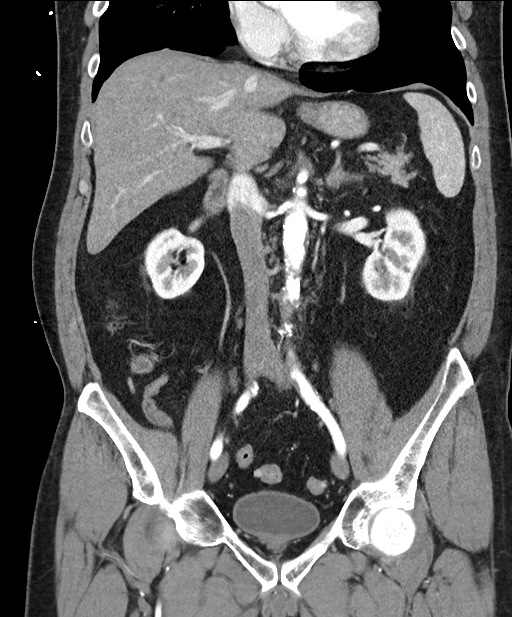

[16 of 46 positions shown; findings below may reference images not displayed]

FINDINGS: Lower chest: Partially imaged coronary stent and coronary artery
calcifications. Heart size is normal. Lung bases are unremarkable.

Hepatobiliary: No focal liver abnormality is seen. No radiopaque
gallstones, biliary dilatation, or pericholecystic inflammatory
changes.

Pancreas: Unremarkable. No pancreatic ductal dilatation or
surrounding inflammatory changes.

Spleen: Normal in size without focal abnormality.

Adrenals/Urinary Tract: Adrenal glands are normal in appearance.
LEFT renal cyst is 12 millimeters. The ureters are unremarkable. The
bladder and visualized portion of the urethra are normal.

Stomach/Bowel: The stomach and small bowel loops are normal in
appearance. The appendix is well seen and has a normal appearance.
There are numerous colonic diverticula. No acute diverticulitis.

Vascular/Lymphatic: There is moderate atherosclerotic calcification
of the abdominal aorta. No associated aneurysm. Although involved by
atherosclerosis, there is vascular opacification of the celiac axis,
superior mesenteric artery, and inferior mesenteric artery. Normal
appearance of the portal venous system and inferior vena cava. No
retroperitoneal or mesenteric adenopathy.

Reproductive: Prostate is unremarkable.

Other: No abdominal wall hernia or abnormality. No abdominopelvic
ascites.

Musculoskeletal: No acute or significant osseous findings.
IMPRESSION: 1.  No evidence for acute  abnormality.
2. Colonic diverticulosis without acute diverticulitis.
3. Coronary artery stents/coronary artery calcifications.
4.  Aortic atherosclerosis.  ([id number])
5. Benign LEFT renal cyst.

## 2020-05-13 ENCOUNTER — Other Ambulatory Visit: Payer: Self-pay | Admitting: Interventional Cardiology

## 2020-05-15 ENCOUNTER — Emergency Department (HOSPITAL_COMMUNITY)
Admission: EM | Admit: 2020-05-15 | Discharge: 2020-05-15 | Disposition: A | Discharge status: Home / Self Care | Payer: Medicare Other | Attending: Emergency Medicine | Admitting: Emergency Medicine

## 2020-05-15 ENCOUNTER — Emergency Department (HOSPITAL_BASED_OUTPATIENT_CLINIC_OR_DEPARTMENT_OTHER)
Admission: EM | Admit: 2020-05-15 | Discharge: 2020-05-15 | Disposition: A | Discharge status: Home / Self Care | Payer: Medicare Other | Source: Home / Self Care | Attending: Emergency Medicine | Admitting: Emergency Medicine

## 2020-05-15 ENCOUNTER — Emergency Department (HOSPITAL_BASED_OUTPATIENT_CLINIC_OR_DEPARTMENT_OTHER): Payer: Medicare Other

## 2020-05-15 ENCOUNTER — Other Ambulatory Visit: Payer: Self-pay

## 2020-05-15 ENCOUNTER — Encounter (HOSPITAL_BASED_OUTPATIENT_CLINIC_OR_DEPARTMENT_OTHER): Payer: Self-pay | Admitting: *Deleted

## 2020-05-15 ENCOUNTER — Encounter (HOSPITAL_COMMUNITY): Payer: Self-pay

## 2020-05-15 DIAGNOSIS — K5900 Constipation, unspecified: Secondary | ICD-10-CM | POA: Diagnosis not present

## 2020-05-15 DIAGNOSIS — Z7982 Long term (current) use of aspirin: Secondary | ICD-10-CM | POA: Insufficient documentation

## 2020-05-15 DIAGNOSIS — N132 Hydronephrosis with renal and ureteral calculous obstruction: Secondary | ICD-10-CM | POA: Insufficient documentation

## 2020-05-15 DIAGNOSIS — I25110 Atherosclerotic heart disease of native coronary artery with unstable angina pectoris: Secondary | ICD-10-CM | POA: Diagnosis not present

## 2020-05-15 DIAGNOSIS — I10 Essential (primary) hypertension: Secondary | ICD-10-CM | POA: Insufficient documentation

## 2020-05-15 DIAGNOSIS — R10813 Right lower quadrant abdominal tenderness: Secondary | ICD-10-CM | POA: Insufficient documentation

## 2020-05-15 DIAGNOSIS — Z87891 Personal history of nicotine dependence: Secondary | ICD-10-CM | POA: Insufficient documentation

## 2020-05-15 DIAGNOSIS — N390 Urinary tract infection, site not specified: Secondary | ICD-10-CM

## 2020-05-15 DIAGNOSIS — R300 Dysuria: Secondary | ICD-10-CM | POA: Insufficient documentation

## 2020-05-15 DIAGNOSIS — C44319 Basal cell carcinoma of skin of other parts of face: Secondary | ICD-10-CM | POA: Diagnosis not present

## 2020-05-15 DIAGNOSIS — Z951 Presence of aortocoronary bypass graft: Secondary | ICD-10-CM | POA: Insufficient documentation

## 2020-05-15 DIAGNOSIS — Z79899 Other long term (current) drug therapy: Secondary | ICD-10-CM | POA: Diagnosis not present

## 2020-05-15 DIAGNOSIS — R1031 Right lower quadrant pain: Secondary | ICD-10-CM | POA: Diagnosis present

## 2020-05-15 DIAGNOSIS — K219 Gastro-esophageal reflux disease without esophagitis: Secondary | ICD-10-CM | POA: Insufficient documentation

## 2020-05-15 DIAGNOSIS — N200 Calculus of kidney: Secondary | ICD-10-CM

## 2020-05-15 DIAGNOSIS — N1330 Unspecified hydronephrosis: Secondary | ICD-10-CM

## 2020-05-15 LAB — COMPREHENSIVE METABOLIC PANEL
ALT: 32 U/L (ref 0–44)
AST: 23 U/L (ref 15–41)
Albumin: 4.5 g/dL (ref 3.5–5.0)
Alkaline Phosphatase: 61 U/L (ref 38–126)
Anion gap: 11 (ref 5–15)
BUN: 28 mg/dL — ABNORMAL HIGH (ref 8–23)
CO2: 24 mmol/L (ref 22–32)
Calcium: 9.5 mg/dL (ref 8.9–10.3)
Chloride: 101 mmol/L (ref 98–111)
Creatinine, Ser: 1.41 mg/dL — ABNORMAL HIGH (ref 0.61–1.24)
GFR calc Af Amer: >60 mL/min (ref >60)
GFR calc non Af Amer: 52 mL/min — ABNORMAL LOW (ref >60)
Glucose, Bld: 111 mg/dL — ABNORMAL HIGH (ref 70–99)
Potassium: 4.1 mmol/L (ref 3.5–5.1)
Sodium: 136 mmol/L (ref 135–145)
Total Bilirubin: 0.8 mg/dL (ref 0.3–1.2)
Total Protein: 7.8 g/dL (ref 6.5–8.1)

## 2020-05-15 LAB — CBC
HCT: 45.5 % (ref 39.0–52.0)
Hemoglobin: 14.8 g/dL (ref 13.0–17.0)
MCH: 30.6 pg (ref 26.0–34.0)
MCHC: 32.5 g/dL (ref 30.0–36.0)
MCV: 94 fL (ref 80.0–100.0)
Platelets: 239 10*3/uL (ref 150–400)
RBC: 4.84 MIL/uL (ref 4.22–5.81)
RDW: 12.5 % (ref 11.5–15.5)
WBC: 11.1 10*3/uL — ABNORMAL HIGH (ref 4.0–10.5)
nRBC: 0 % (ref 0.0–0.2)

## 2020-05-15 LAB — URINALYSIS, ROUTINE W REFLEX MICROSCOPIC
Bilirubin Urine: NEGATIVE
Bilirubin Urine: NEGATIVE
Glucose, UA: NEGATIVE mg/dL
Glucose, UA: NEGATIVE mg/dL
Ketones, ur: NEGATIVE mg/dL
Ketones, ur: 15 mg/dL — AB
Nitrite: NEGATIVE
Nitrite: NEGATIVE
Protein, ur: 100 mg/dL — AB
Protein, ur: NEGATIVE mg/dL
RBC / HPF: >50 RBC/hpf — ABNORMAL HIGH (ref 0–5)
Specific Gravity, Urine: 1.029 (ref 1.005–1.030)
Specific Gravity, Urine: >1.03 — ABNORMAL HIGH (ref 1.005–1.030)
pH: 5 (ref 5.0–8.0)
pH: 6 (ref 5.0–8.0)

## 2020-05-15 LAB — LIPASE, BLOOD: Lipase: 24 U/L (ref 11–51)

## 2020-05-15 LAB — URINALYSIS, MICROSCOPIC (REFLEX): RBC / HPF: >50 RBC/hpf (ref 0–5)

## 2020-05-15 MED ORDER — FENTANYL CITRATE (PF) 100 MCG/2ML IJ SOLN
50.0000 ug | Freq: Once | INTRAMUSCULAR | Status: DC
  Filled 2020-05-15: qty 2

## 2020-05-15 MED ORDER — HYDROMORPHONE HCL 1 MG/ML IJ SOLN
0.5000 mg | Freq: Once | INTRAMUSCULAR | Status: AC
  Administered 2020-05-15: 0.5 mg via INTRAVENOUS
  Filled 2020-05-15: qty 1

## 2020-05-15 MED ORDER — CEPHALEXIN 500 MG PO CAPS: 500.0000 mg | ORAL_CAPSULE | Freq: Four times a day (QID) | ORAL | 0 refills | Status: DC

## 2020-05-15 MED ORDER — CEPHALEXIN 250 MG PO CAPS
500.0000 mg | ORAL_CAPSULE | Freq: Once | ORAL | Status: AC
  Administered 2020-05-15: 500 mg via ORAL
  Filled 2020-05-15: qty 2

## 2020-05-15 MED ORDER — ONDANSETRON HCL 4 MG/2ML IJ SOLN
4.0000 mg | Freq: Once | INTRAMUSCULAR | Status: AC
  Administered 2020-05-15: 4 mg via INTRAVENOUS
  Filled 2020-05-15: qty 2

## 2020-05-15 MED ORDER — OXYCODONE-ACETAMINOPHEN 5-325 MG PO TABS: 1.0000 | ORAL_TABLET | Freq: Four times a day (QID) | ORAL | 0 refills | Status: DC | PRN

## 2020-05-15 MED ORDER — SODIUM CHLORIDE 0.9 % IV BOLUS
1000.0000 mL | Freq: Once | INTRAVENOUS | Status: AC
  Administered 2020-05-15: 1000 mL via INTRAVENOUS

## 2020-05-15 MED ORDER — ONDANSETRON 4 MG PO TBDP: ORAL_TABLET | ORAL | 0 refills | Status: DC

## 2020-05-15 MED ORDER — TAMSULOSIN HCL 0.4 MG PO CAPS: 0.4000 mg | ORAL_CAPSULE | Freq: Every day | ORAL | 0 refills | Status: DC

## 2020-05-15 NOTE — Discharge Instructions (Addendum)
You were seen in the emergency department and found to have a kidney stone.  We are sending you home with multiple medications to assist with passing the stone:   -Flomax-this is a medication to help pass the stone, it allows urine to exit the body more freely.  Please take this once daily with a meal.  -Ibuprofen 800 mg-this is a medication that will help with pain as well as passing the stone.  Please take this every 8 hours.  Take this with food as it can cause stomach upset and at worst stomach bleeding.  Do not take other NSAIDs such as Motrin, Aleve, Advil, Mobic, or Naproxen with this medicine as they are similar and would propagate any potential side effects.   -Percocet-this is a narcotic/controlled substance medication that has potential addicting qualities.  We recommend that you take 1-2 tablets every 6 hours as needed for severe pain.  Do not drive or operate heavy machinery when taking this medicine as it can be sedating. Do not drink alcohol or take other sedating medications when taking this medicine for safety reasons.  Keep this out of reach of small children.  Please be aware this medicine has Tylenol in it (325 mg/tab) do not exceed the maximum dose of Tylenol in a day per over the counter recommendations should you decide to supplement with Tylenol over the counter.   -Zofran-this is an antinausea medication, you may take this every 8 hours as needed for nausea and vomiting, please allow the tablet to dissolve underneath of your tongue.   - Keflex take 500 mg 4 times daily to treat for potential infection, we have sent urine for culture and if based on these results a different antibiotic would be more appropriate will be called and new antibiotics will be prescribed.  Take antibiotics with food on your stomach.  We have prescribed you new medication(s) today. Discuss the medications prescribed today with your pharmacist as they can have adverse effects and interactions with your  other medicines including over the counter and prescribed medications. Seek medical evaluation if you start to experience new or abnormal symptoms after taking one of these medicines, seek care immediately if you start to experience difficulty breathing, feeling of your throat closing, facial swelling, or rash as these could be indications of a more serious allergic reaction  Please follow-up with the urology group provided in your discharge instructions within 3 to 5 days.  Return to the ER for new or worsening symptoms including but not limited to worsening pain not controlled by these medicines, inability to keep fluids down, fever, or any other concerns that you may have.

## 2020-05-15 NOTE — ED Notes (Signed)
Patient transported to CT 

## 2020-05-15 NOTE — ED Notes (Signed)
Not enough urine for culture.

## 2020-05-15 NOTE — ED Triage Notes (Signed)
He went to [facility] this am but left due to long wait. His lab work and urine has been done. He thinks he passed a kidney stone.

## 2020-05-15 NOTE — ED Provider Notes (Signed)
[place] [facility] EMERGENCY DEPARTMENT Provider Note   CSN: [id number] Arrival date & time: 05/15/20  1509     History Chief Complaint  Patient presents with  . Abdominal Pain    [name] is a 64 y.o. male.  [name] is a 64 y.o. male with a PMH of MI, CAD, HTN who presents to the ED today for evaluation of pain over his right kidney radiating to his groin. Pt stated that he thinks he passed a kidney stone this morning. He has had sharp pain in his right side radiating down into his groin for one day. Pt stated that he has never passed a kidney stone in the past but was told that he had stones in his kidneys from a previous CT scan. Pt stated that he is still having pain and that nothing makes the pain better or worse. Pt endorses burning with urination, denies noting any blood in his urine. Pt endorses nausea and one episode of vomiting last night. Pt stated he has not had much of an appetite due to the nausea. He denies any other abdominal pain or chest pain at this time. Pt stated that he had a fever and some diarrhea earlier in the week but this has since subsided. His wife if currently sick at home. Pt stated that does not currently have any fever, chills, body aches, or diarrhea. He denies any cough, shortness of breath, or sore throat. He endorses general fatigue. Pt was brought to [facility] ED this morning by EMS but left the facility due to long wait times. Blood work and urine sample were obtained at [facility] but he was not seen by a provider at that facility. pt is vaccinated against covid-19, his last dose was approximately two weeks ago per patient. Pt states his wife tested negative for covid this week.        Past Medical History:  Diagnosis Date  . Anxiety   . Arthritis   . Basal cell carcinoma (BCC) of forehead   . CAD (coronary artery disease)    a. 10/2015 ant STEMI >> LHC with 3 v CAD; oLAD tx with POBA >> emergent CABG. b. Multiple  evals since that time, early graft failure of SVG-RCA by cath 03/2016. c. 2/19 PCI/DES x1 to pRCA, normal EF.  . Carotid artery disease ([place])    a. 40-59% BICA 02/2018.  Marland Kitchen Depression   . Dyspnea   . Ectopic atrial tachycardia ([place])   . Esophageal reflux    eosinophil esophagitis  . Family history of adverse reaction to anesthesia    "sister has PONV" (06/21/2017)  . Former tobacco use   . Gout   . Hepatitis C    "treated and cured" (06/21/2017)  . High cholesterol   . History of kidney stones   . Hypertension   . Ischemic cardiomyopathy    a. EF 25-30% at intraop TEE 4/17  //  b. Limited Echo 5/17 - EF 45-50%, mild ant HK. c. EF 55-65% by cath 09/2017.  . Migraine    "3-4/yr" (06/21/2017)  . Myocardial infarction ([place]) 10/2015  . Palpitations   . Sinus bradycardia    a. HR dropping into 40s in 02/2016 -> BB reduced.  . Stroke ([place]) 10/2016   "small one; sometimes my memory/cognitive issues" (06/21/2017)  . Symptomatic hypotension    a. 02/2016 ER visit -> meds reduced.  . Syncope     Patient Active Problem List   Diagnosis Date  Noted  . Chest pain 11/07/2019  . Gastroesophageal reflux disease   . Cervical radiculopathy 10/17/2018  . Preoperative clearance 10/04/2018  . Palpitations   . Coronary artery disease involving coronary bypass graft of native heart with angina pectoris ([place])   . Transient loss of consciousness 03/24/2018  . Ectopic atrial tachycardia ([place]) 02/09/2018  . Central chest pain 03/10/2017  . Family hx-stroke 11/10/2016  . Stroke-like episode ([place]) - R brain, s/p tPA 11/09/2016  . Unstable angina ([place]) 09/07/2016  . Claudication of both lower extremities ([place])   . Pure hypercholesterolemia   . Tobacco abuse disorder   . CAD of autologous artery bypass graft without angina   . Chest pain at rest 06/10/2016  . Abnormal nuclear stress test - HIGH RISK 04/20/2016  . Old MI (myocardial infarction)   . Essential hypertension 02/26/2016  . Ischemic  cardiomyopathy 12/25/2015  . Hyperlipidemia LDL goal <70 12/25/2015  . 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 Coronary artery disease involving native coronary artery of native heart with angina pectoris ([place]) 11/28/2015  . 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 Acute MI anterior wall first episode care [facility])   . Precordial chest pain 03/07/2015  . Dysphagia 03/07/2015  . Gout attack 03/07/2015  . Mixed bipolar I disorder ([place]) 03/07/2015  . Fibromyalgia 07/09/2014  . Gout 07/09/2014  . Anxiety 07/09/2014  . Depression 07/09/2014  . Hepatitis C 11/20/2012  . Eosinophilic esophagitis 41/74/0814    Past Surgical History:  Procedure Laterality Date  . ANTERIOR CERVICAL DECOMP/DISCECTOMY FUSION N/A 10/17/2018   Procedure: Anterior Cervical Decompression Fusion - Cervical seven -Thoracic one;  Surgeon: Consuella Lose, MD;  Location: [facility];  Service: Neurosurgery;  Laterality: N/A;  . BASAL CELL CARCINOMA EXCISION     "forehead  . BIOPSY  07/20/2019   Procedure: BIOPSY;  Surgeon: Carol Ada, MD;  Location: [facility];  Service: Endoscopy;;  . CARDIAC CATHETERIZATION N/A 11/28/2015   Procedure: Left Heart Cath and Coronary Angiography;  Surgeon: [name], MD;  Location: [place] [facility];  Service: Cardiovascular;  Laterality: N/A;  . CARDIAC CATHETERIZATION N/A 11/28/2015   Procedure: Coronary Balloon Angioplasty;  Surgeon: [name], MD;  Location: [place] [facility];  Service: Cardiovascular;  Laterality: N/A;  ostial LAD  . CARDIAC CATHETERIZATION N/A 11/28/2015   Procedure: Coronary/Graft Angiography;  Surgeon: [name], MD;  Location: [place] [facility];  Service: Cardiovascular;  Laterality: N/A;  coronaries only   . CARDIAC CATHETERIZATION N/A 04/21/2016   Procedure: Left Heart Cath and Coronary Angiography;  Surgeon: Wellington Hampshire, MD;  Location: [place] CV LAB;  Service: Cardiovascular;  Laterality: N/A;  . CARDIAC CATHETERIZATION N/A  06/14/2016   Procedure: Left Heart Cath and Cors/Grafts Angiography;  Surgeon: Lorretta Harp, MD;  Location: [place] [facility] LAB;  Service: Cardiovascular;  Laterality: N/A;  . CARDIAC CATHETERIZATION N/A 09/08/2016   Procedure: Left Heart Cath and Cors/Grafts Angiography;  Surgeon: Wellington Hampshire, MD;  Location: [place] [facility];  Service: Cardiovascular;  Laterality: N/A;  . CARDIAC CATHETERIZATION    . CORONARY ARTERY BYPASS GRAFT N/A 11/28/2015   Procedure: CORONARY ARTERY BYPASS GRAFTING (CABG) TIMES FIVE USING LEFT INTERNAL MAMMARY ARTERY AND RIGHT GREATER SAPHENOUS,VIEN HARVEATED BY ENDOVIEN, INTRAOPPRATIVE TEE;  Surgeon: Gaye Pollack, MD;  Location: [facility];  Service: Open Heart Surgery;  Laterality: N/A;  . CORONARY STENT INTERVENTION N/A 10/05/2017   Procedure: CORONARY STENT INTERVENTION;  Surgeon: Larae Grooms  S, MD;  Location: [place] [facility];  Service: Cardiovascular;  Laterality: N/A;  . ESOPHAGOGASTRODUODENOSCOPY (EGD) WITH PROPOFOL N/A 07/20/2019   Procedure: ESOPHAGOGASTRODUODENOSCOPY (EGD) WITH PROPOFOL;  Surgeon: Carol Ada, MD;  [facility];  Service: Endoscopy;  Laterality: N/A;  . HUMERUS SURGERY Right 1969   "tumor inside bone; filled it w/bone chips"  . LEFT HEART CATH AND CORS/GRAFTS ANGIOGRAPHY N/A 03/11/2017   Procedure: Left Heart Cath and Cors/Grafts Angiography;  Surgeon: Leonie Man, MD;  Location: [place] [facility];  Service: Cardiovascular;  Laterality: N/A;  . LEFT HEART CATH AND CORS/GRAFTS ANGIOGRAPHY N/A 10/05/2017   Procedure: LEFT HEART CATH AND CORS/GRAFTS ANGIOGRAPHY;  Surgeon: [name], MD;  Location: [place] [facility] LAB;  Service: Cardiovascular;  Laterality: N/A;  . LEFT HEART CATH AND CORS/GRAFTS ANGIOGRAPHY N/A 04/11/2019   Procedure: LEFT HEART CATH AND CORS/GRAFTS ANGIOGRAPHY;  Surgeon: [name], MD;  Location: [place] [facility] LAB;  Service: Cardiovascular;  Laterality: N/A;  . PERIPHERAL VASCULAR  CATHETERIZATION N/A 06/14/2016   Procedure: Lower Extremity Angiography;  Surgeon: Lorretta Harp, MD;  Location: [place] CV LAB;  Service: Cardiovascular;  Laterality: N/A;       Family History  Problem Relation Age of Onset  . Lung cancer Mother   . Heart Problems Father   . Heart attack Father 86  . Stroke Father   . Heart failure Father   . Heart attack Maternal Grandmother   . Stroke Maternal Grandmother   . Heart attack Paternal Uncle   . Hypertension Brother   . Autoimmune disease Neg Hx     Social History   Tobacco Use  . Smoking status: Former Smoker    Packs/day: 0.75    Years: 44.00    Pack years: 33.00    Types: Cigarettes    Quit date: 11/28/2015    Years since quitting: 4.4  . Smokeless tobacco: Never Used  Vaping Use  . Vaping Use: Never used  Substance Use Topics  . Alcohol use: Yes    Comment: occ  . Drug use: No    Comment: 06/21/2017 "nothing since the 1980s"    Home Medications Prior to Admission medications   Medication Sig Start Date End Date Taking? Authorizing Provider  acetaminophen (TYLENOL) 500 MG tablet Take 1,000 mg by mouth every 6 (six) hours as needed for mild pain or headache.     [provider]  amLODipine (NORVASC) 10 MG tablet TAKE 1 TABLET(10 MG) BY MOUTH DAILY 03/10/20   [name], MD  aspirin 81 MG tablet Take 1 tablet (81 mg total) by mouth daily. 10/24/18   Costella, Vista Mink, PA-C  atorvastatin (LIPITOR) 80 MG tablet TAKE 1 TABLET(80 MG) BY MOUTH DAILY 12/10/19   [name], MD  cephALEXin (KEFLEX) 500 MG capsule Take 1 capsule (500 mg total) by mouth 4 (four) times daily. 05/15/20   Jacqlyn Larsen, PA-C  chlordiazePOXIDE (LIBRIUM) 10 MG capsule Take 10-20 mg by mouth 3 (three) times daily as needed for anxiety.     [provider]  cloNIDine (CATAPRES) 0.1 MG tablet Take 0.1 mg by mouth at bedtime. 04/09/20   [provider]  clopidogrel (PLAVIX) 75 MG tablet TAKE 1 TABLET BY  MOUTH EVERY DAY Patient taking differently: Take 75 mg by mouth every evening.  02/06/20   [name], MD  lisinopril (ZESTRIL) 5 MG tablet TAKE 1 TABLET BY MOUTH DAILY. MAY TAKE ADDITIONAL TABLET ONCE DAILY FOR  SYSTOLIC BLOOD PRESSURE GREATER THAN 150 ONE HOUR AFTER TAKING DAILY DOSE 05/13/20   [name], MD  metoprolol succinate (TOPROL-XL) 25 MG 24 hr tablet Take 25 mg by mouth daily.    [provider]  Multiple Vitamins-Minerals (ONE-A-DAY MENS 50+ ADVANTAGE PO) Take 1 tablet by mouth daily.     [provider]  nitroGLYCERIN (NITROSTAT) 0.4 MG SL tablet PLACE 1 TABLET UNDER THE TONGUE EVERY 5 MINUTES AS NEEDED FOR CHEST PAIN. 3 DOSES MAX 04/16/20   [name], MD  ondansetron (ZOFRAN ODT) 4 MG disintegrating tablet 4mg  ODT q4 hours prn nausea/vomit 05/15/20   Jacqlyn Larsen, PA-C  oxyCODONE-acetaminophen (PERCOCET) 5-325 MG tablet Take 1-2 tablets by mouth every 6 (six) hours as needed. 05/15/20   Jacqlyn Larsen, PA-C  pantoprazole (PROTONIX) 40 MG tablet Take 40 mg by mouth every evening.     [provider]  ranolazine (RANEXA) 1000 MG SR tablet Take 1 tablet (1,000 mg total) by mouth 2 (two) times daily. Patient taking differently: Take 1,000 mg by mouth daily.  11/08/19   [name], NP  tamsulosin (FLOMAX) 0.4 MG CAPS capsule Take 1 capsule (0.4 mg total) by mouth daily. 05/15/20   Jacqlyn Larsen, PA-C    Allergies    Prednisone, Tetanus toxoids, Wellbutrin [bupropion], Morphine and related, Scallops [shellfish allergy], and Chantix [varenicline]  Review of Systems   Review of Systems  Constitutional: Negative for chills and fever.  HENT: Negative.   Respiratory: Negative for cough and shortness of breath.   Cardiovascular: Negative for chest pain.  Gastrointestinal: Positive for abdominal pain, nausea and vomiting. Negative for diarrhea.  Genitourinary: Positive for dysuria and flank pain. Negative for discharge, frequency,  hematuria, penile pain, scrotal swelling and testicular pain.  Musculoskeletal: Negative for arthralgias and myalgias.  Skin: Negative for color change and rash.  All other systems reviewed and are negative.   Physical Exam Updated Vital Signs BP (!) 121/100   Pulse 72   Temp 98.6 F (37 C) (Oral)   Resp 16   Ht 5\' 8"  (1.727 m)   Wt 89.3 kg   SpO2 100%   BMI 29.93 kg/m   Physical Exam Vitals and nursing note reviewed.  Constitutional:      General: He is not in acute distress.    Appearance: He is well-developed and normal weight. He is not ill-appearing or diaphoretic.     Comments: Well-appearing and in no distress  HENT:     Head: Normocephalic and atraumatic.  Eyes:     General:        Right eye: No discharge.        Left eye: No discharge.  Cardiovascular:     Rate and Rhythm: Normal rate and regular rhythm.     Heart sounds: Normal heart sounds.  Pulmonary:     Effort: Pulmonary effort is normal. No respiratory distress.     Breath sounds: Normal breath sounds. No wheezing or rales.     Comments: Respirations equal and unlabored, patient able to speak in full sentences, lungs clear to auscultation bilaterally Abdominal:     General: Bowel sounds are normal. There is no distension.     Palpations: Abdomen is soft. There is no mass.     Tenderness: There is abdominal tenderness in the right lower quadrant. There is right CVA tenderness. There is no guarding.     Hernia: No hernia is present.     Comments: Abdomen is  soft, nondistended, bowel sounds present throughout, there is mild CVA tenderness on the right as well as some mild right lower quadrant tenderness, no guarding or rebound tenderness, all other quadrants nontender to palpation  Musculoskeletal:        General: No deformity.     Cervical back: Neck supple.  Skin:    General: Skin is warm and dry.     Capillary Refill: Capillary refill takes less than 2 seconds.  Neurological:     Mental Status: He is  alert.     Coordination: Coordination normal.     Comments: Speech is clear, able to follow commands Moves extremities without ataxia, coordination intact  Psychiatric:        Mood and Affect: Mood normal.        Behavior: Behavior normal.     ED Results / Procedures / Treatments   Labs (all labs ordered are listed, but only abnormal results are displayed) Labs Reviewed  URINALYSIS, ROUTINE W REFLEX MICROSCOPIC - Abnormal; Notable for the following components:      Result Value   APPearance CLOUDY (*)    Specific Gravity, Urine >1.030 (*)    Hgb urine dipstick LARGE (*)    Ketones, ur 15 (*)    Leukocytes,Ua TRACE (*)    All other components within normal limits  URINALYSIS, MICROSCOPIC (REFLEX) - Abnormal; Notable for the following components:   Bacteria, UA MANY (*)    All other components within normal limits  URINE CULTURE    EKG None  Radiology CT Renal Stone Study  Result Date: 05/15/2020 CLINICAL DATA:  Right-sided flank pain and hematuria EXAM: CT ABDOMEN AND PELVIS WITHOUT CONTRAST TECHNIQUE: Multidetector CT imaging of the abdomen and pelvis was performed following the standard protocol without IV contrast. COMPARISON:  None. FINDINGS: Lower chest: The visualized heart size within normal limits. No pericardial fluid/thickening. No hiatal hernia. The visualized portions of the lungs are clear. Hepatobiliary: Although limited due to the lack of intravenous contrast, normal in appearance without gross focal abnormality. No evidence of calcified gallstones or biliary ductal dilatation. Pancreas:  Unremarkable.  No surrounding inflammatory changes. Spleen: Normal in size. Although limited due to the lack of intravenous contrast, normal in appearance. Adrenals/Urinary Tract: Both adrenal glands appear normal. Mild right pelvicaliectasis and ureterectasis is seen down to the level of the UVJ. In the posterior right bladder there is a 4 mm calculus present. Mild right-sided  perinephric and periureteral stranding is seen. There is punctate calcifications in the upper pole of the right kidney. There is a 1.8 cm low-density lesion within the lower pole the left kidney, likely renal cyst. Stomach/Bowel: The stomach, small bowel, and colon are normal in appearance. No inflammatory changes or obstructive findings. appendix is normal. Scattered colonic diverticula are noted. Vascular/Lymphatic: There are no enlarged abdominal or pelvic lymph nodes. Scattered aortic atherosclerotic calcifications are seen without aneurysmal dilatation. Reproductive: The prostate is unremarkable. Other: No evidence of abdominal wall mass or hernia. Musculoskeletal: No acute or significant osseous findings. IMPRESSION: Mild right hydronephrosis with perinephric stranding likely from recently passed stone with a 4 mm posterior right bladder calculus. Punctate nonobstructing right renal calculus Diverticulosis without diverticulitis. Aortic Atherosclerosis (ICD10-I70.0). Electronically Signed   By: Prudencio Pair M.D.   On: 05/15/2020 18:07    Procedures Procedures (including critical care time)  Medications Ordered in ED Medications  sodium chloride 0.9 % bolus 1,000 mL (0 mLs Intravenous Stopped 05/15/20 1929)  ondansetron (ZOFRAN) injection 4 mg (4 mg  Intravenous Given 05/15/20 1829)  HYDROmorphone (DILAUDID) injection 0.5 mg (0.5 mg Intravenous Given 05/15/20 1829)  cephALEXin (KEFLEX) capsule 500 mg (500 mg Oral Given 05/15/20 1919)    ED Course  I have reviewed the triage vital signs and the nursing notes.  Pertinent labs & imaging results that were available during my care of the patient were reviewed by me and considered in my medical decision making (see chart for details).    MDM Rules/Calculators/A&P                         Patient presents to the ED with complaints of flank/abdominal pain. Patient nontoxic appearing, vitals without significant abnormalities. On exam patient is tender  over the right flank and right lower quadrant, no peritoneal signs. DDX: nephrolithiasis, pyelonephritis/UTI, cholecystitis, pancreatitis, bowel obstruction/perforation, appendicitis, dissection, feel nephrolithiasis is most likely at this time will evaluate with CT renal study, lab work was completed when patient went to [facility] for evaluation and these were reviewed in the chart.  Significant for urinalysis with RBCs and WBCs present with few bacteria.  Slight bump in creatinine was previously 1.36, today 1.41, no other significant electrolyte derangements.  Slight leukocytosis.  Analgesics, anti-emetics, and fluids ordered.   CT renal study with mild right hydro and perinephric stranding likely from recently passed stone with a 4 mm stone noted in the bladder, punctate nonobstructing stones noted in the right kidney. Renal function minimally elevated.  Urinalysis repeated, with hematuria and WBCs present, with many bacteria, this is an increase from prior urinalysis, will send for culture and treat with antibiotics with concern for stone with superimposed infection, but patient is well-appearing, does not meet sepsis criteria do not feel he will require admission. Patient tolerating PO in the ER with pain well controlled. Will discharge home with Keflex, Flomax, Zofran, NSAID, and Percocet with urology follow up. [place] Controlled Substance reporting System queried. I discussed results, treatment plan, need for urology follow-up, and return precautions with the patient. Provided opportunity for questions, patient confirmed understanding and is in agreement with plan.   Final Clinical Impression(s) / ED Diagnoses Final diagnoses:  Kidney stones  Hydronephrosis of right kidney  Acute UTI    Rx / DC Orders ED Discharge Orders         Ordered    oxyCODONE-acetaminophen (PERCOCET) 5-325 MG tablet  Every 6 hours PRN        05/15/20 2000    ondansetron (ZOFRAN ODT) 4 MG disintegrating tablet         05/15/20 2000    tamsulosin (FLOMAX) 0.4 MG CAPS capsule  Daily        05/15/20 2000    cephALEXin (KEFLEX) 500 MG capsule  4 times daily        05/15/20 2000           Jacqlyn Larsen, PA-C 05/16/20 0132    Truddie Hidden, MD 05/16/20 1036

## 2020-05-15 NOTE — ED Triage Notes (Signed)
Patient arrived with RLQ pain that started today and constipation. Some nausea but has since resolved.

## 2020-05-15 NOTE — ED Notes (Signed)
Pt unable to urinate at this time.  

## 2020-05-16 LAB — URINE CULTURE

## 2020-05-27 ENCOUNTER — Emergency Department (HOSPITAL_BASED_OUTPATIENT_CLINIC_OR_DEPARTMENT_OTHER): Payer: Medicare Other

## 2020-05-27 ENCOUNTER — Other Ambulatory Visit: Payer: Self-pay

## 2020-05-27 ENCOUNTER — Emergency Department (HOSPITAL_BASED_OUTPATIENT_CLINIC_OR_DEPARTMENT_OTHER)
Admission: EM | Admit: 2020-05-27 | Discharge: 2020-05-27 | Disposition: A | Discharge status: Home / Self Care | Payer: Medicare Other | Attending: Emergency Medicine | Admitting: Emergency Medicine

## 2020-05-27 ENCOUNTER — Encounter (HOSPITAL_BASED_OUTPATIENT_CLINIC_OR_DEPARTMENT_OTHER): Payer: Self-pay | Admitting: *Deleted

## 2020-05-27 DIAGNOSIS — I10 Essential (primary) hypertension: Secondary | ICD-10-CM | POA: Insufficient documentation

## 2020-05-27 DIAGNOSIS — Z79899 Other long term (current) drug therapy: Secondary | ICD-10-CM | POA: Diagnosis not present

## 2020-05-27 DIAGNOSIS — Z85828 Personal history of other malignant neoplasm of skin: Secondary | ICD-10-CM | POA: Diagnosis not present

## 2020-05-27 DIAGNOSIS — Z87891 Personal history of nicotine dependence: Secondary | ICD-10-CM | POA: Insufficient documentation

## 2020-05-27 DIAGNOSIS — I2510 Atherosclerotic heart disease of native coronary artery without angina pectoris: Secondary | ICD-10-CM | POA: Diagnosis not present

## 2020-05-27 DIAGNOSIS — N23 Unspecified renal colic: Secondary | ICD-10-CM | POA: Diagnosis not present

## 2020-05-27 DIAGNOSIS — N200 Calculus of kidney: Secondary | ICD-10-CM | POA: Diagnosis present

## 2020-05-27 DIAGNOSIS — Z951 Presence of aortocoronary bypass graft: Secondary | ICD-10-CM | POA: Diagnosis not present

## 2020-05-27 DIAGNOSIS — R109 Unspecified abdominal pain: Secondary | ICD-10-CM

## 2020-05-27 LAB — URINALYSIS, ROUTINE W REFLEX MICROSCOPIC
Bilirubin Urine: NEGATIVE
Glucose, UA: NEGATIVE mg/dL
Hgb urine dipstick: NEGATIVE
Ketones, ur: NEGATIVE mg/dL
Leukocytes,Ua: NEGATIVE
Nitrite: NEGATIVE
Protein, ur: NEGATIVE mg/dL
Specific Gravity, Urine: <1.005 — ABNORMAL LOW (ref 1.005–1.030)
pH: 6.5 (ref 5.0–8.0)

## 2020-05-27 LAB — COMPREHENSIVE METABOLIC PANEL
ALT: 66 U/L — ABNORMAL HIGH (ref 0–44)
AST: 47 U/L — ABNORMAL HIGH (ref 15–41)
Albumin: 4.8 g/dL (ref 3.5–5.0)
Alkaline Phosphatase: 49 U/L (ref 38–126)
Anion gap: 9 (ref 5–15)
BUN: 16 mg/dL (ref 8–23)
CO2: 26 mmol/L (ref 22–32)
Calcium: 9.8 mg/dL (ref 8.9–10.3)
Chloride: 102 mmol/L (ref 98–111)
Creatinine, Ser: 1.01 mg/dL (ref 0.61–1.24)
GFR calc Af Amer: >60 mL/min (ref >60)
GFR calc non Af Amer: >60 mL/min (ref >60)
Glucose, Bld: 112 mg/dL — ABNORMAL HIGH (ref 70–99)
Potassium: 4.4 mmol/L (ref 3.5–5.1)
Sodium: 137 mmol/L (ref 135–145)
Total Bilirubin: 0.6 mg/dL (ref 0.3–1.2)
Total Protein: 8 g/dL (ref 6.5–8.1)

## 2020-05-27 LAB — CBC WITH DIFFERENTIAL/PLATELET
Abs Immature Granulocytes: 0.03 10*3/uL (ref 0.00–0.07)
Basophils Absolute: 0 10*3/uL (ref 0.0–0.1)
Basophils Relative: 0 %
Eosinophils Absolute: 0 10*3/uL (ref 0.0–0.5)
Eosinophils Relative: 0 %
HCT: 44.6 % (ref 39.0–52.0)
Hemoglobin: 14.5 g/dL (ref 13.0–17.0)
Immature Granulocytes: 0 %
Lymphocytes Relative: 15 %
Lymphs Abs: 1.5 10*3/uL (ref 0.7–4.0)
MCH: 30.2 pg (ref 26.0–34.0)
MCHC: 32.5 g/dL (ref 30.0–36.0)
MCV: 92.9 fL (ref 80.0–100.0)
Monocytes Absolute: 0.6 10*3/uL (ref 0.1–1.0)
Monocytes Relative: 7 %
Neutro Abs: 7.5 10*3/uL (ref 1.7–7.7)
Neutrophils Relative %: 78 %
Platelets: 232 10*3/uL (ref 150–400)
RBC: 4.8 MIL/uL (ref 4.22–5.81)
RDW: 12 % (ref 11.5–15.5)
WBC: 9.8 10*3/uL (ref 4.0–10.5)
nRBC: 0 % (ref 0.0–0.2)

## 2020-05-27 MED ORDER — KETOROLAC TROMETHAMINE 30 MG/ML IJ SOLN
30.0000 mg | Freq: Once | INTRAMUSCULAR | Status: AC
  Administered 2020-05-27: 30 mg via INTRAVENOUS
  Filled 2020-05-27: qty 1

## 2020-05-27 MED ORDER — ONDANSETRON HCL 4 MG/2ML IJ SOLN
4.0000 mg | Freq: Once | INTRAMUSCULAR | Status: AC
  Administered 2020-05-27: 4 mg via INTRAVENOUS
  Filled 2020-05-27: qty 2

## 2020-05-27 MED ORDER — HYDROMORPHONE HCL 1 MG/ML IJ SOLN
0.5000 mg | Freq: Once | INTRAMUSCULAR | Status: AC
  Administered 2020-05-27: 0.5 mg via INTRAVENOUS
  Filled 2020-05-27: qty 1

## 2020-05-27 NOTE — ED Provider Notes (Signed)
[facility] EMERGENCY DEPARTMENT Provider Note   CSN: [id number] Arrival date & time: 05/27/20  1016     History Chief Complaint  Patient presents with  . Nephrolithiasis    [name] is a 64 y.o. male who presents for evaluation of right-sided flank and abdominal pain.  He reports that he was seen in the ED on 05/15/2020 was diagnosed with kidney stones.  He states that he had a 4 mm kidney stone.  He was given pain medication, antibiotics, Flomax which he states he has been taking.  He states that the pain never went away completely but felt like this morning, it worsened.  He states he has an appointment with urology this afternoon but felt like the pain was too severe to wait.  He has not had any of his pain medication today.  He denies any nausea/vomiting, difficulty urinating, dysuria, hematuria.  He has not had any fevers, nausea/vomiting.  The history is provided by the patient.       Past Medical History:  Diagnosis Date  . Anxiety   . Arthritis   . Basal cell carcinoma (BCC) of forehead   . CAD (coronary artery disease)    a. 10/2015 ant STEMI >> LHC with 3 v CAD; oLAD tx with POBA >> emergent CABG. b. Multiple evals since that time, early graft failure of SVG-RCA by cath 03/2016. c. 2/19 PCI/DES x1 to pRCA, normal EF.  . Carotid artery disease ([place])    a. 40-59% BICA 02/2018.  Marland Kitchen Depression   . Dyspnea   . Ectopic atrial tachycardia ([place])   . Esophageal reflux    eosinophil esophagitis  . Family history of adverse reaction to anesthesia    "sister has PONV" (06/21/2017)  . Former tobacco use   . Gout   . Hepatitis C    "treated and cured" (06/21/2017)  . High cholesterol   . History of kidney stones   . Hypertension   . Ischemic cardiomyopathy    a. EF 25-30% at intraop TEE 4/17  //  b. Limited Echo 5/17 - EF 45-50%, mild ant HK. c. EF 55-65% by cath 09/2017.  . Migraine    "3-4/yr" (06/21/2017)  . Myocardial infarction ([place]) 10/2015  .  Palpitations   . Sinus bradycardia    a. HR dropping into 40s in 02/2016 -> BB reduced.  . Stroke ([place]) 10/2016   "small one; sometimes my memory/cognitive issues" (06/21/2017)  . Symptomatic hypotension    a. 02/2016 ER visit -> meds reduced.  . Syncope     Patient Active Problem List   Diagnosis Date Noted  . Chest pain 11/07/2019  . Gastroesophageal reflux disease   . Cervical radiculopathy 10/17/2018  . Preoperative clearance 10/04/2018  . Palpitations   . Coronary artery disease involving coronary bypass graft of native heart with angina pectoris ([place])   . Transient loss of consciousness 03/24/2018  . Ectopic atrial tachycardia ([place]) 02/09/2018  . Central chest pain 03/10/2017  . Family hx-stroke 11/10/2016  . Stroke-like episode ([place]) - R brain, s/p tPA 11/09/2016  . Unstable angina ([place]) 09/07/2016  . Claudication of both lower extremities ([place])   . Pure hypercholesterolemia   . Tobacco abuse disorder   . CAD of autologous artery bypass graft without angina   . Chest pain at rest 06/10/2016  . Abnormal nuclear stress test - HIGH RISK 04/20/2016  . Old MI (myocardial infarction)   . Essential hypertension 02/26/2016  . Ischemic cardiomyopathy 12/25/2015  .  Hyperlipidemia LDL goal <70 12/25/2015  . 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 Coronary artery disease involving native coronary artery of native heart with angina pectoris ([place]) 11/28/2015  . 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 Acute MI anterior wall first episode care [facility])   . Precordial chest pain 03/07/2015  . Dysphagia 03/07/2015  . Gout attack 03/07/2015  . Mixed bipolar I disorder ([place]) 03/07/2015  . Fibromyalgia 07/09/2014  . Gout 07/09/2014  . Anxiety 07/09/2014  . Depression 07/09/2014  . Hepatitis C 11/20/2012  . Eosinophilic esophagitis 93/81/0175    Past Surgical History:  Procedure Laterality Date  . ANTERIOR CERVICAL DECOMP/DISCECTOMY FUSION N/A 10/17/2018   Procedure: Anterior Cervical  Decompression Fusion - Cervical seven -Thoracic one;  Surgeon: Consuella Lose, MD;  Location: [facility];  Service: Neurosurgery;  Laterality: N/A;  . BASAL CELL CARCINOMA EXCISION     "forehead  . BIOPSY  07/20/2019   Procedure: BIOPSY;  Surgeon: Carol Ada, MD;  Location: [facility];  Service: Endoscopy;;  . CARDIAC CATHETERIZATION N/A 11/28/2015   Procedure: Left Heart Cath and Coronary Angiography;  Surgeon: [name], MD;  Location: [facility] CV LAB;  Service: Cardiovascular;  Laterality: N/A;  . CARDIAC CATHETERIZATION N/A 11/28/2015   Procedure: Coronary Balloon Angioplasty;  Surgeon: [name], MD;  Location: [facility];  Service: Cardiovascular;  Laterality: N/A;  ostial LAD  . CARDIAC CATHETERIZATION N/A 11/28/2015   Procedure: Coronary/Graft Angiography;  Surgeon: [name], MD;  Location: [facility] CV LAB;  Service: Cardiovascular;  Laterality: N/A;  coronaries only   . CARDIAC CATHETERIZATION N/A 04/21/2016   Procedure: Left Heart Cath and Coronary Angiography;  Surgeon: Wellington Hampshire, MD;  Location: [facility] CV LAB;  Service: Cardiovascular;  Laterality: N/A;  . CARDIAC CATHETERIZATION N/A 06/14/2016   Procedure: Left Heart Cath and Cors/Grafts Angiography;  Surgeon: Lorretta Harp, MD;  Location: [facility] LAB;  Service: Cardiovascular;  Laterality: N/A;  . CARDIAC CATHETERIZATION N/A 09/08/2016   Procedure: Left Heart Cath and Cors/Grafts Angiography;  Surgeon: Wellington Hampshire, MD;  Location: [facility];  Service: Cardiovascular;  Laterality: N/A;  . CARDIAC CATHETERIZATION    . CORONARY ARTERY BYPASS GRAFT N/A 11/28/2015   Procedure: CORONARY ARTERY BYPASS GRAFTING (CABG) TIMES FIVE USING LEFT INTERNAL MAMMARY ARTERY AND RIGHT GREATER SAPHENOUS,VIEN HARVEATED BY ENDOVIEN, INTRAOPPRATIVE TEE;  Surgeon: Gaye Pollack, MD;  Location: [facility];  Service: Open Heart Surgery;  Laterality: N/A;  . CORONARY STENT INTERVENTION  N/A 10/05/2017   Procedure: CORONARY STENT INTERVENTION;  Surgeon: [name], MD;  Location: [facility];  Service: Cardiovascular;  Laterality: N/A;  . ESOPHAGOGASTRODUODENOSCOPY (EGD) WITH PROPOFOL N/A 07/20/2019   Procedure: ESOPHAGOGASTRODUODENOSCOPY (EGD) WITH PROPOFOL;  Surgeon: Carol Ada, MD;  [facility];  Service: Endoscopy;  Laterality: N/A;  . HUMERUS SURGERY Right 1969   "tumor inside bone; filled it w/bone chips"  . LEFT HEART CATH AND CORS/GRAFTS ANGIOGRAPHY N/A 03/11/2017   Procedure: Left Heart Cath and Cors/Grafts Angiography;  Surgeon: Leonie Man, MD;  Location: [place] [facility];  Service: Cardiovascular;  Laterality: N/A;  . LEFT HEART CATH AND CORS/GRAFTS ANGIOGRAPHY N/A 10/05/2017   Procedure: LEFT HEART CATH AND CORS/GRAFTS ANGIOGRAPHY;  Surgeon: [name], MD;  Location: [facility];  Service: Cardiovascular;  Laterality: N/A;  . LEFT HEART CATH AND CORS/GRAFTS ANGIOGRAPHY N/A 04/11/2019   Procedure: LEFT HEART CATH AND CORS/GRAFTS ANGIOGRAPHY;  Surgeon: [name], MD;  Location: [facility] CV LAB;  Service: Cardiovascular;  Laterality: N/A;  . PERIPHERAL VASCULAR CATHETERIZATION N/A 06/14/2016   Procedure: Lower Extremity Angiography;  Surgeon: Lorretta Harp, MD;  Location: [facility];  Service: Cardiovascular;  Laterality: N/A;       Family History  Problem Relation Age of Onset  . Lung cancer Mother   . Heart Problems Father   . Heart attack Father 34  . Stroke Father   . Heart failure Father   . Heart attack Maternal Grandmother   . Stroke Maternal Grandmother   . Heart attack Paternal Uncle   . Hypertension Brother   . Autoimmune disease Neg Hx     Social History   Tobacco Use  . Smoking status: Former Smoker    Packs/day: 0.75    Years: 44.00    Pack years: 33.00    Types: Cigarettes    Quit date: 11/28/2015    Years since quitting: 4.4  . Smokeless tobacco: Never Used    Vaping Use  . Vaping Use: Never used  Substance Use Topics  . Alcohol use: Yes    Comment: occ  . Drug use: No    Comment: 06/21/2017 "nothing since the 1980s"    Home Medications Prior to Admission medications   Medication Sig Start Date End Date Taking? Authorizing Provider  acetaminophen (TYLENOL) 500 MG tablet Take 1,000 mg by mouth every 6 (six) hours as needed for mild pain or headache.     [provider]  amLODipine (NORVASC) 10 MG tablet TAKE 1 TABLET(10 MG) BY MOUTH DAILY 03/10/20   [name], MD  aspirin 81 MG tablet Take 1 tablet (81 mg total) by mouth daily. 10/24/18   Costella, Vista Mink, PA-C  atorvastatin (LIPITOR) 80 MG tablet TAKE 1 TABLET(80 MG) BY MOUTH DAILY 12/10/19   [name], MD  cephALEXin (KEFLEX) 500 MG capsule Take 1 capsule (500 mg total) by mouth 4 (four) times daily. 05/15/20   Jacqlyn Larsen, PA-C  chlordiazePOXIDE (LIBRIUM) 10 MG capsule Take 10-20 mg by mouth 3 (three) times daily as needed for anxiety.     [provider]  cloNIDine (CATAPRES) 0.1 MG tablet Take 0.1 mg by mouth at bedtime. 04/09/20   [provider]  clopidogrel (PLAVIX) 75 MG tablet TAKE 1 TABLET BY MOUTH EVERY DAY Patient taking differently: Take 75 mg by mouth every evening.  02/06/20   [name], MD  lisinopril (ZESTRIL) 5 MG tablet TAKE 1 TABLET BY MOUTH DAILY. MAY TAKE ADDITIONAL TABLET ONCE DAILY FOR SYSTOLIC BLOOD PRESSURE GREATER THAN 150 ONE HOUR AFTER TAKING DAILY DOSE 05/13/20   [name], MD  metoprolol succinate (TOPROL-XL) 25 MG 24 hr tablet Take 25 mg by mouth daily.    [provider]  Multiple Vitamins-Minerals (ONE-A-DAY MENS 50+ ADVANTAGE PO) Take 1 tablet by mouth daily.     [provider]  nitroGLYCERIN (NITROSTAT) 0.4 MG SL tablet PLACE 1 TABLET UNDER THE TONGUE EVERY 5 MINUTES AS NEEDED FOR CHEST PAIN. 3 DOSES MAX 04/16/20   [name], MD  ondansetron (ZOFRAN ODT) 4 MG  disintegrating tablet 4mg  ODT q4 hours prn nausea/vomit 05/15/20   Jacqlyn Larsen, PA-C  oxyCODONE-acetaminophen (PERCOCET) 5-325 MG tablet Take 1-2 tablets by mouth every 6 (six) hours as needed. 05/15/20   Jacqlyn Larsen, PA-C  pantoprazole (PROTONIX) 40 MG tablet Take 40 mg by mouth every evening.  [provider]  ranolazine (RANEXA) 1000 MG SR tablet Take 1 tablet (1,000 mg total) by mouth 2 (two) times daily. Patient taking differently: Take 1,000 mg by mouth daily.  11/08/19   [name], NP  tamsulosin (FLOMAX) 0.4 MG CAPS capsule Take 1 capsule (0.4 mg total) by mouth daily. 05/15/20   Jacqlyn Larsen, PA-C    Allergies    Prednisone, Tetanus toxoids, Wellbutrin [bupropion], Morphine and related, Scallops [shellfish allergy], and Chantix [varenicline]  Review of Systems   Review of Systems  Constitutional: Negative for fever.  Respiratory: Negative for cough and shortness of breath.   Cardiovascular: Negative for chest pain.  Gastrointestinal: Positive for abdominal pain. Negative for nausea and vomiting.  Genitourinary: Positive for flank pain. Negative for dysuria and hematuria.  Neurological: Negative for headaches.  All other systems reviewed and are negative.   Physical Exam Updated Vital Signs BP 130/83 (BP Location: Right Arm)   Pulse 68   Temp 98.8 F (37.1 C) (Oral)   Resp 18   Ht 5\' 8"  (1.727 m)   Wt 88.5 kg   SpO2 100%   BMI 29.65 kg/m   Physical Exam Vitals and nursing note reviewed.  Constitutional:      Appearance: Normal appearance. He is well-developed.  HENT:     Head: Normocephalic and atraumatic.  Eyes:     General: Lids are normal.     Conjunctiva/sclera: Conjunctivae normal.     Pupils: Pupils are equal, round, and reactive to light.  Cardiovascular:     Rate and Rhythm: Normal rate and regular rhythm.     Pulses: Normal pulses.     Heart sounds: Normal heart sounds. No murmur heard.  No friction rub. No gallop.     Pulmonary:     Effort: Pulmonary effort is normal.     Breath sounds: Normal breath sounds.  Abdominal:     Palpations: Abdomen is soft. Abdomen is not rigid.     Tenderness: There is abdominal tenderness in the right lower quadrant. There is no right CVA tenderness, left CVA tenderness or guarding.     Comments: Abdomen is soft, nondistended.  Tenderness palpation noted diffusely to the right lower quadrant.  No focal tenderness at McBurney's point.  No rigidity, guarding.  No CVA tenderness noted bilaterally.  Musculoskeletal:        General: Normal range of motion.     Cervical back: Full passive range of motion without pain.  Skin:    General: Skin is warm and dry.     Capillary Refill: Capillary refill takes less than 2 seconds.  Neurological:     Mental Status: He is alert and oriented to person, place, and time.  Psychiatric:        Speech: Speech normal.     ED Results / Procedures / Treatments   Labs (all labs ordered are listed, but only abnormal results are displayed) Labs Reviewed  COMPREHENSIVE METABOLIC PANEL - Abnormal; Notable for the following components:      Result Value   Glucose, Bld 112 (*)    AST 47 (*)    ALT 66 (*)    All other components within normal limits  URINALYSIS, ROUTINE W REFLEX MICROSCOPIC - Abnormal; Notable for the following components:   Specific Gravity, Urine <1.005 (*)    All other components within normal limits  CBC WITH DIFFERENTIAL/PLATELET    EKG None  Radiology CT Renal Stone Study  Result Date: 05/27/2020 CLINICAL DATA:  Acute right flank pain. EXAM: CT ABDOMEN AND PELVIS WITHOUT CONTRAST TECHNIQUE: Multidetector CT imaging of the abdomen and pelvis was performed following the standard protocol without IV contrast. COMPARISON:  May 15, 2020. FINDINGS: Lower chest: No acute abnormality. Hepatobiliary: No focal liver abnormality is seen. No gallstones, gallbladder wall thickening, or biliary dilatation. Pancreas:  Unremarkable. No pancreatic ductal dilatation or surrounding inflammatory changes. Spleen: Normal in size without focal abnormality. Adrenals/Urinary Tract: Adrenal glands appear normal. Small nonobstructive right renal calculus is noted. Stable left renal cyst is noted. No hydronephrosis or renal obstruction is noted. Urinary bladder is unremarkable. Stomach/Bowel: Stomach is within normal limits. Appendix appears normal. No evidence of bowel wall thickening, distention, or inflammatory changes. Vascular/Lymphatic: Aortic atherosclerosis. No enlarged abdominal or pelvic lymph nodes. Reproductive: Prostate is unremarkable. Other: No abdominal wall hernia or abnormality. No abdominopelvic ascites. Musculoskeletal: No acute or significant osseous findings. IMPRESSION: 1. Small nonobstructive right renal calculus. No hydronephrosis or renal obstruction is noted. 2. Aortic atherosclerosis. 3. No other abnormality seen in the abdomen or pelvis. Aortic Atherosclerosis (ICD10-I70.0). Electronically Signed   By: Marijo Conception M.D.   On: 05/27/2020 11:56    Procedures Procedures (including critical care time)  Medications Ordered in ED Medications  ketorolac (TORADOL) 30 MG/ML injection 30 mg (30 mg Intravenous Given 05/27/20 1149)  ondansetron (ZOFRAN) injection 4 mg (4 mg Intravenous Given 05/27/20 1149)  HYDROmorphone (DILAUDID) injection 0.5 mg (0.5 mg Intravenous Given 05/27/20 1312)    ED Course  I have reviewed the triage vital signs and the nursing notes.  Pertinent labs & imaging results that were available during my care of the patient were reviewed by me and considered in my medical decision making (see chart for details).    MDM Rules/Calculators/A&P                          64 year old male who presents for evaluation of right flank pain.  Recently diagnosed with kidney stones and is scheduled to see urology today.  No fevers, difficulty urinating, nausea/vomiting.  Initial arrival, he is  afebrile, nontoxic-appearing.  Vital signs are stable but he is slightly hypertensive.  On exam, he has pain noted diffusely to the right lower quadrant.  No focal point.  We will plan to check repeat labs. Suspect this is continued kidney stone pain.  History/physical exam concerning for dissection, appendicitis.  UA shows no hgb or infectious etiology. CBC shows no leukocytosis or anemia.   Review CT scan noted on 05/15/2020 showed mild right hydronephrosis with 4 mm posterior right bladder calculus.  There is a punctate nonobstructing right renal calculus.  Patient still with pain after Toradol.  We will plan for scan for evaluation of any acute abnormality.  CT scan shows small nonobstructive right renal calculus.  No hydronephrosis or renal obstruction is noted.  Discussed results with patient.  Patient hemodynamically stable here in emergency department.  He had an appointment with urology but canceled it when he came to the emergency department.  He has rescheduled them for tomorrow morning.  I discussed with patient that it is very important for him to follow-up with urology.  He is requesting some more pain medication.  I reviewed him on PMP.  He got a prescription for 10 oxycodone on 9/16 when he was seen in the ED.  He then had a prescription for 56 oxycodone on 9/17.  He additionally had 1 more prescription of 10 oxycodone 9/21.  Patient states that he only has 3 pain medication pills left because he has been going through them for his pain.  At this time, given no acute findings as well as reassuring work-up here in the ED and previous prescriptions, do not feel that narcotic pain medication is warranted in this situation.  I did discuss this with patient.  I instructed him to follow-up with urology as directed. At this time, patient exhibits no emergent life-threatening condition that require further evaluation in ED. Patient had ample opportunity for questions and discussion. All patient's  questions were answered with full understanding. Strict return precautions discussed. Patient expresses understanding and agreement to plan.    Portions of this note were generated with Lobbyist. Dictation errors may occur despite best attempts at proofreading.   Final Clinical Impression(s) / ED Diagnoses Final diagnoses:  Flank pain  Renal colic    Rx / DC Orders ED Discharge Orders    None       [name], PA-C 05/27/20 [place], [place], DO 05/28/20 [id number]

## 2020-05-27 NOTE — ED Notes (Signed)
Pt ambulatory with steady gait to room 

## 2020-05-27 NOTE — ED Notes (Addendum)
ED Provider Mendel Ryder, [place] at bedside.

## 2020-05-27 NOTE — ED Triage Notes (Signed)
Right flank pain x 1 week. Hx of kidney stones.

## 2020-05-27 NOTE — Discharge Instructions (Signed)
You can take Tylenol or Ibuprofen as directed for pain. You can alternate Tylenol and Ibuprofen every 4 hours. If you take Tylenol at 1pm, then you can take Ibuprofen at 5pm. Then you can take Tylenol again at 9pm.   As we discussed, it is important you follow-up with urology.  Make sure you go to your appointment tomorrow.  Return the emergency department for any worsening pain, fever, vomiting, difficulty urinating or any other worsening or concerning symptoms.

## 2020-05-27 NOTE — ED Notes (Signed)
Pt ambulatory with steady gait. Pt aware urine specimen ordered. Specimen collection device provided to patient.

## 2020-05-27 NOTE — ED Notes (Signed)
Pt requests pain and nausea medication, EDP Mendel Ryder notified

## 2020-05-27 NOTE — ED Notes (Signed)
ED Provider at bedside. 

## 2020-05-27 NOTE — ED Notes (Signed)
Urine culture collected and sent with urine specimen

## 2020-05-27 NOTE — ED Notes (Addendum)
Pt denies pain medications pta. Reports having rx for percocet, has not taken today r/t "not wanting to run out".

## 2020-06-16 ENCOUNTER — Ambulatory Visit: Payer: Medicare Other | Admitting: Interventional Cardiology

## 2020-07-17 IMAGING — CT CT ABD-PELV W/ CM
2 of 5 series · 16 of 46 positions shown, 18 images · IV contrast (APPLIED)
Comparison: 07/13/2018

CLINICAL DATA: RIGHT lower quadrant abdominal pain, nausea,
vomiting, diarrhea, and constipation for 2 months, history of
diverticulosis, hypertension, hepatitis-C, coronary artery disease
post MI and CABG, stroke, former smoker, ischemic cardiomyopathy

EXAM:
CT ABDOMEN AND PELVIS WITH CONTRAST
TECHNIQUE: Multidetector CT imaging of the abdomen and pelvis was performed
using the standard protocol following bolus administration of
intravenous contrast. Sagittal and coronal MPR images reconstructed
from axial data set.
CONTRAST:  100mL S0ZGCU-DPP IOPAMIDOL (S0ZGCU-DPP) INJECTION 61% IV.
Dilute oral contrast.

[Series 2: axial st · axial · 0.95mm/px · z∈[-585,-140]mm · 13 of 101 slices shown, 15 images]
[im 6/101  soft-tissue]
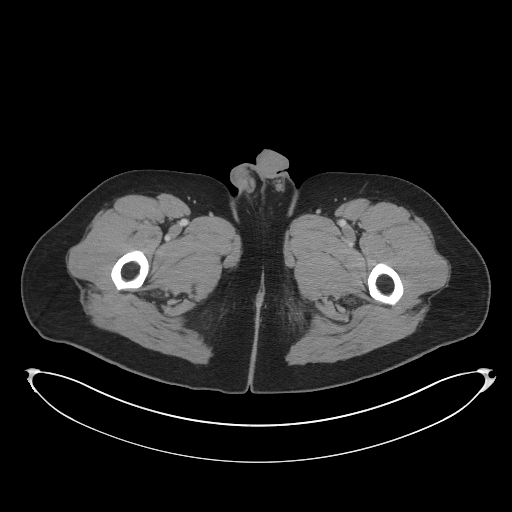
[im 6/101  bone]
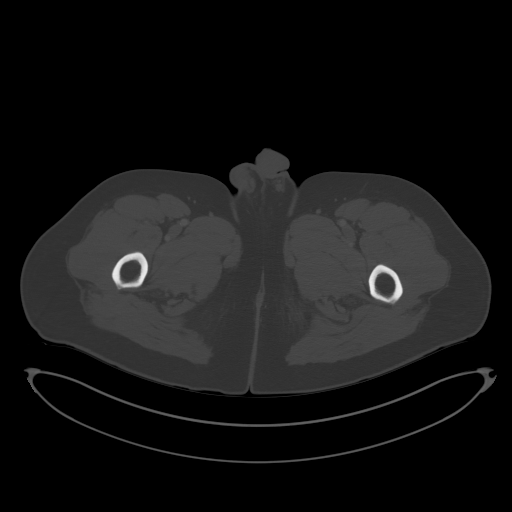
[im 16/101  soft-tissue]
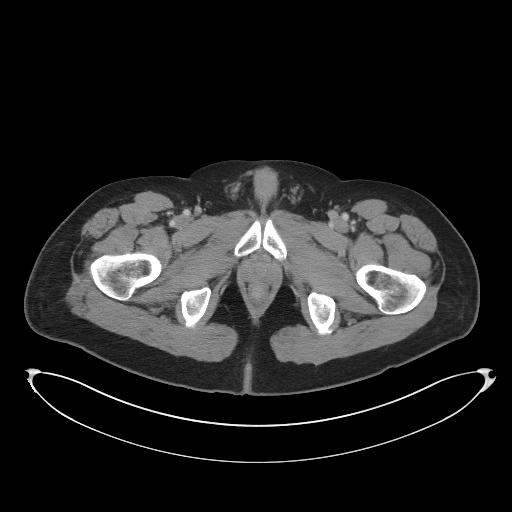
[im 22/101  soft-tissue]
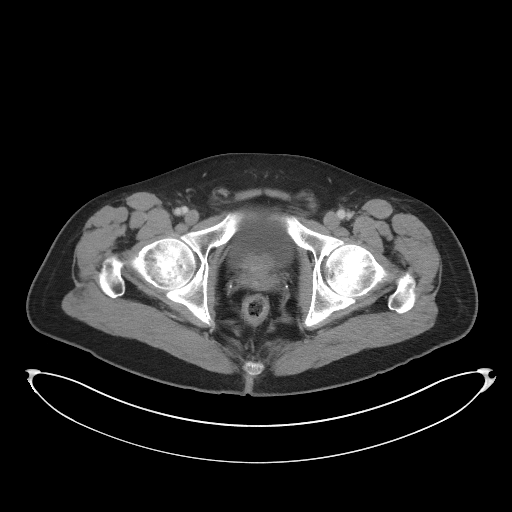
[im 27/101  soft-tissue]
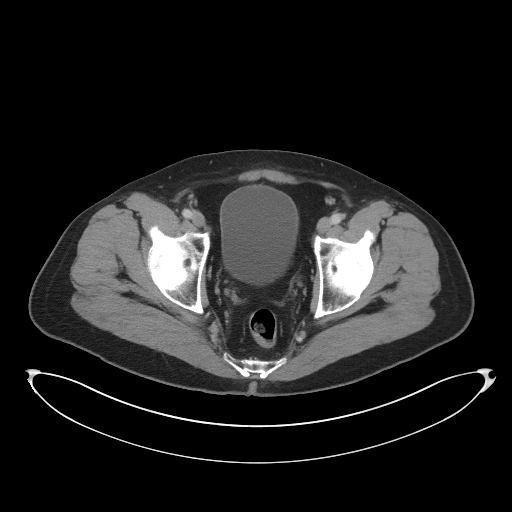
[im 37/101  soft-tissue]
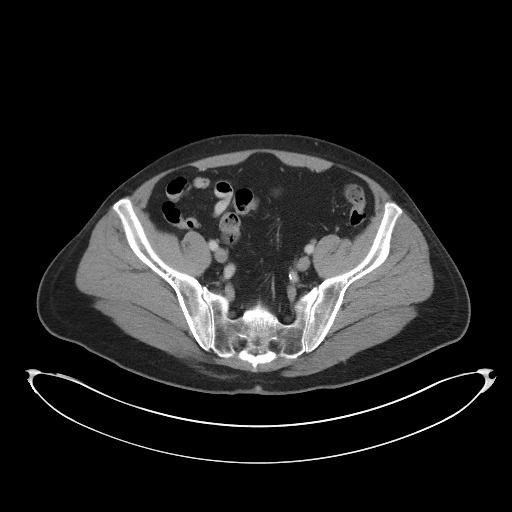
[im 43/101  soft-tissue]
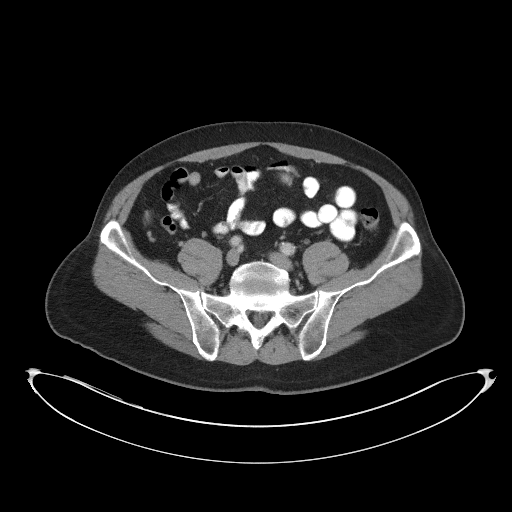
[im 53/101  soft-tissue]
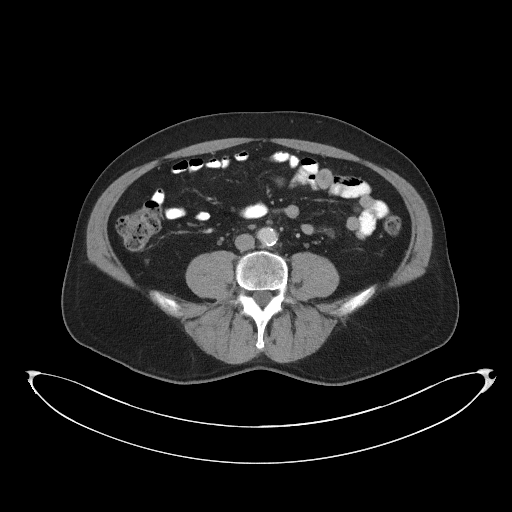
[im 58/101  soft-tissue]
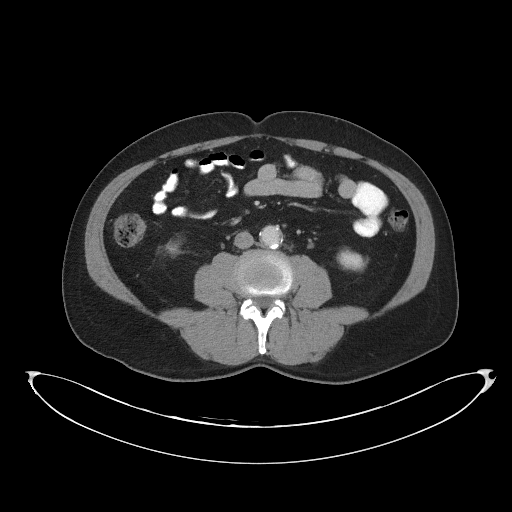
[im 64/101  soft-tissue]
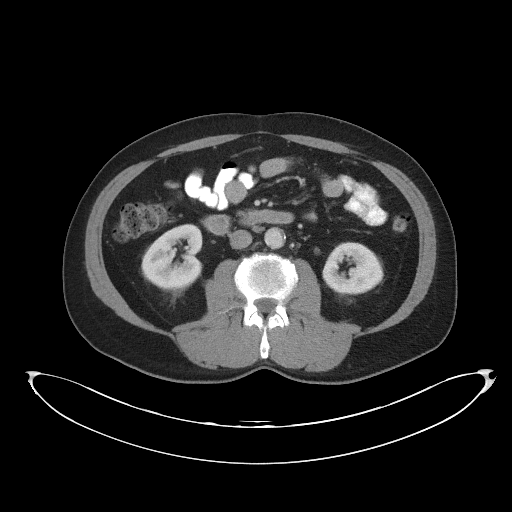
[im 64/101  bone]
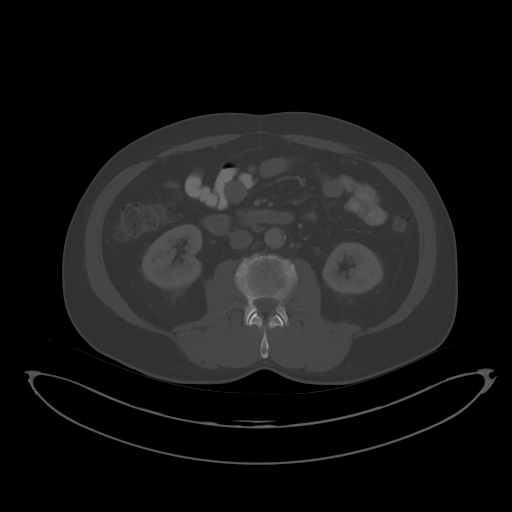
[im 74/101  soft-tissue]
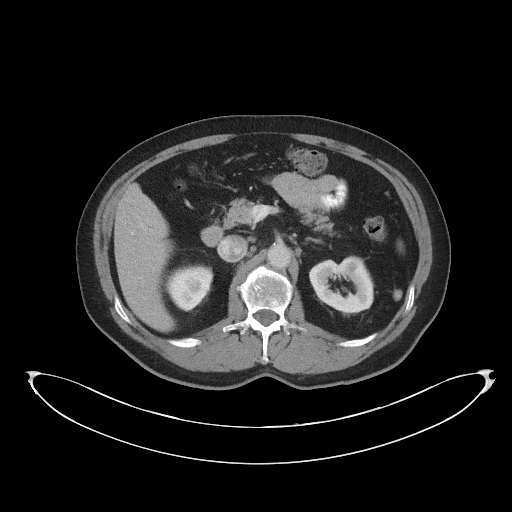
[im 79/101  soft-tissue]
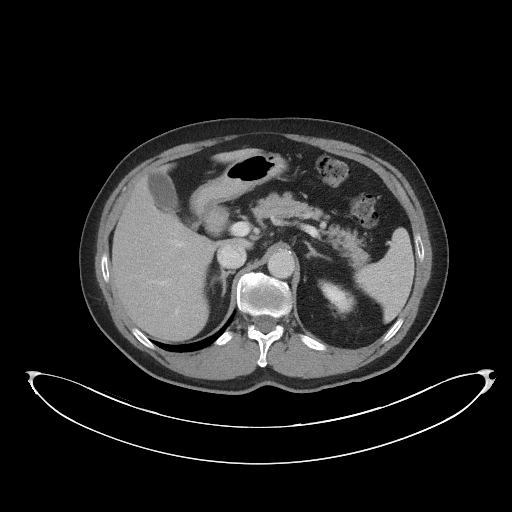
[im 85/101  soft-tissue]
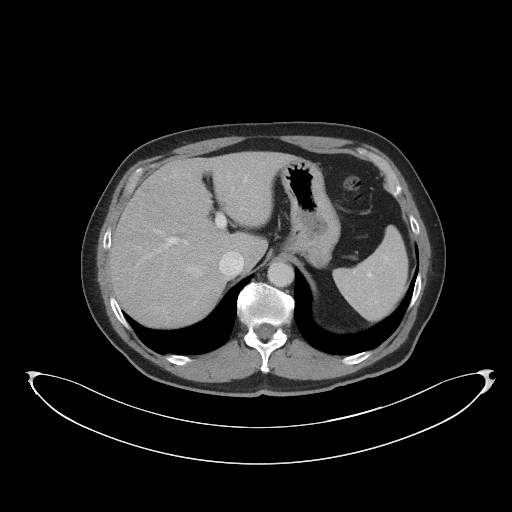
[im 95/101  soft-tissue]
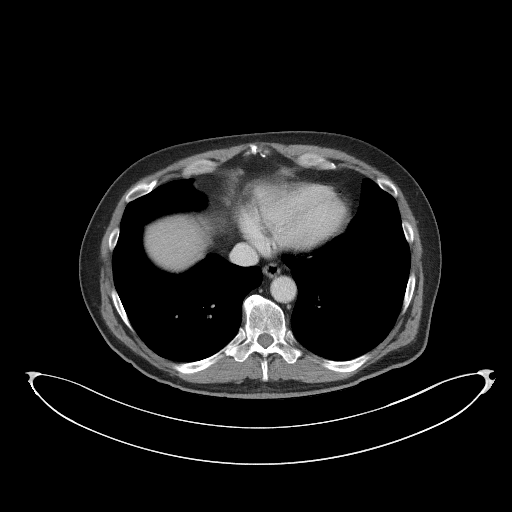

[Series 5: coronal st · coronal · 0.87mm/px · 3 of 85 slices shown]
[im 29/85  soft-tissue]
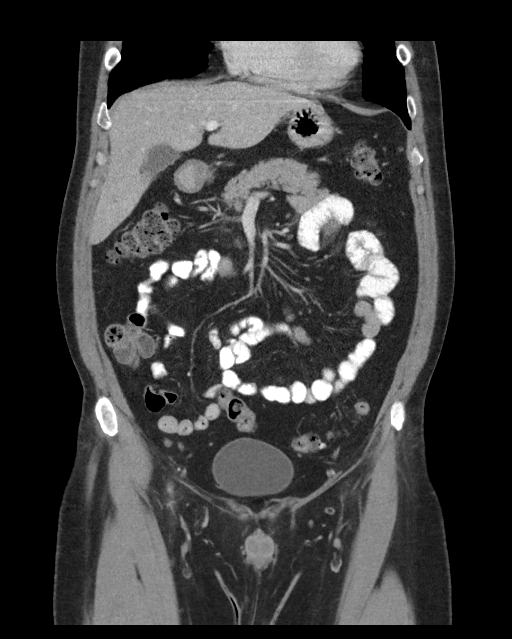
[im 38/85  soft-tissue]
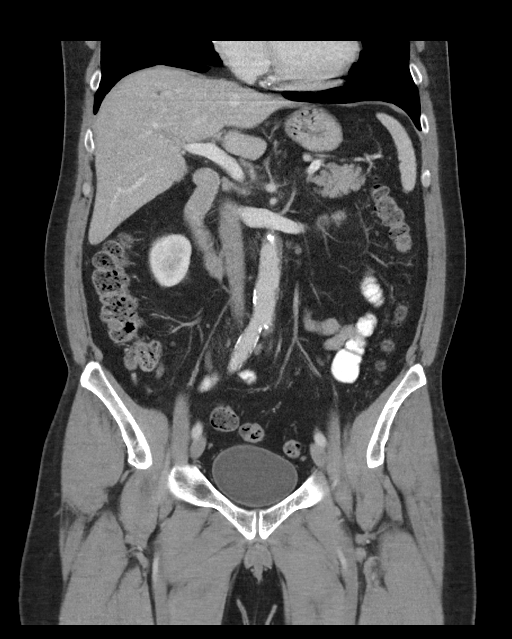
[im 47/85  soft-tissue]
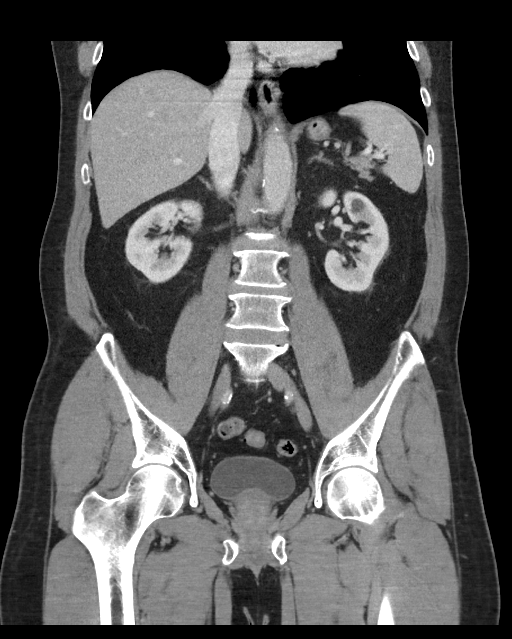

[16 of 46 positions shown; findings below may reference images not displayed]

FINDINGS: Lower chest: Lung bases clear

Hepatobiliary: Tiny hepatic cyst image 10 unchanged. Gallbladder
liver otherwise normal appearance

Pancreas: Normal appearance

Spleen: Normal appearance.  Small splenule inferior to spleen

Adrenals/Urinary Tract: Small LEFT and tiny RIGHT renal cysts. Tiny
nonobstructing calculus upper pole RIGHT kidney. No additional renal
mass, hydronephrosis, or hydroureter. Bladder unremarkable.

Stomach/Bowel: Normal appendix. Stomach normal appearance. Bowel
loops unremarkable.

Vascular/Lymphatic: Atherosclerotic calcifications aorta without
aneurysm. Vascular structures patent grossly patent. No adenopathy.

Reproductive: Unremarkable seminal vesicles. Minimal prostatic
enlargement, gland 4.2 x 3.2 x 4.2 cm (volume = 30 cm^3).

Other: No free air free fluid. Tiny umbilical hernia containing fat.
No acute inflammatory process.

Musculoskeletal: No acute osseous findings.
IMPRESSION: Renal cysts and tiny nonobstructing RIGHT renal calculus.

Minimal prostatic enlargement.

No acute intra-abdominal or intrapelvic abnormalities.

Aortic Atherosclerosis ([id number]).

## 2020-07-27 IMAGING — CT CT ANGIO CHEST-ABD-PELV FOR DISSECTION W/ AND WO/W CM
2 of 7 series · 14 of 46 positions shown, 16 images · IV contrast (OMNI 350)
Comparison: None.

CLINICAL DATA: Back pain starting yesterday radiating to the left
arm.

EXAM:
CT ANGIOGRAPHY CHEST, ABDOMEN AND PELVIS
TECHNIQUE: Multidetector CT imaging through the chest, abdomen and pelvis was
performed using the standard protocol during bolus administration of
intravenous contrast. Multiplanar reconstructed images and MIPs were
obtained and reviewed to evaluate the vascular anatomy.
CONTRAST:  100mL BRRKPT-AFI IOPAMIDOL (BRRKPT-AFI) INJECTION 76%

[Series 7: dissection 2mm · axial · 0.65mm/px · z∈[+938,+1544]mm · 11 of 339 slices shown, 13 images]
[im 18/339  soft-tissue]
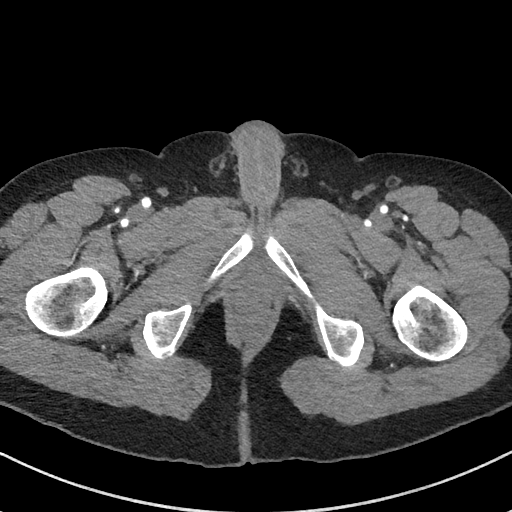
[im 18/339  bone]
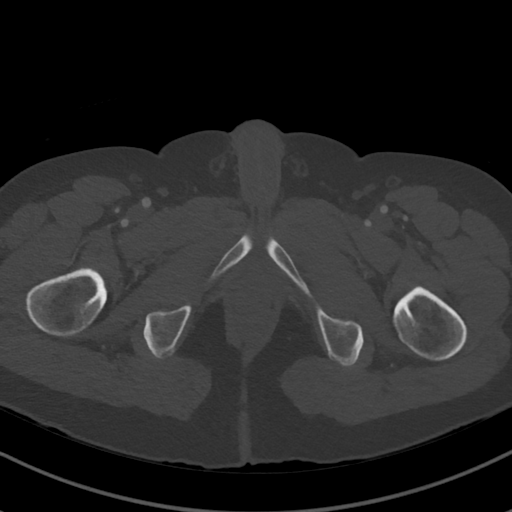
[im 54/339  soft-tissue]
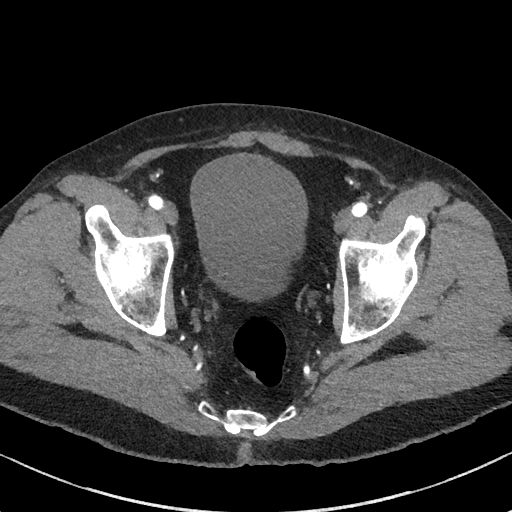
[im 89/339  soft-tissue]
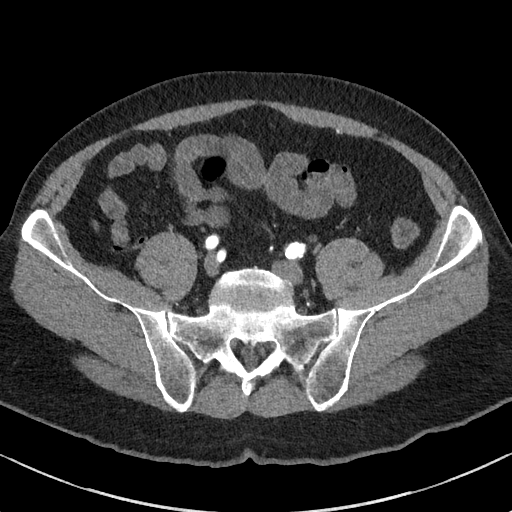
[im 107/339  soft-tissue]
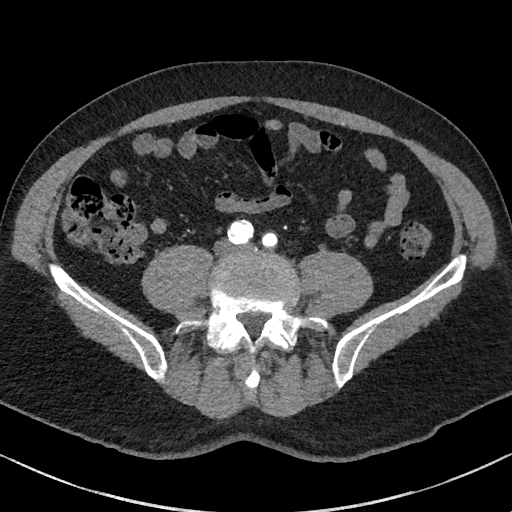
[im 143/339  soft-tissue]
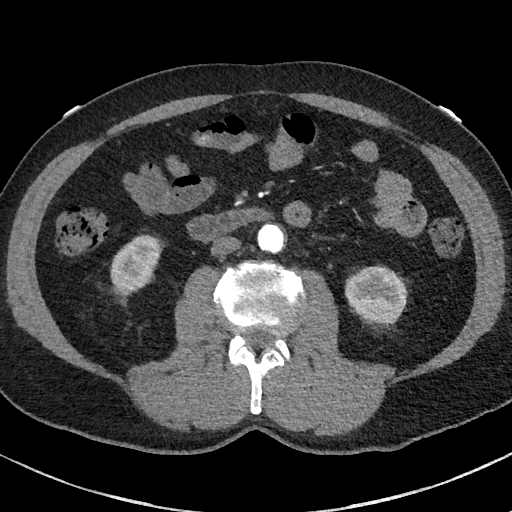
[im 178/339  soft-tissue]
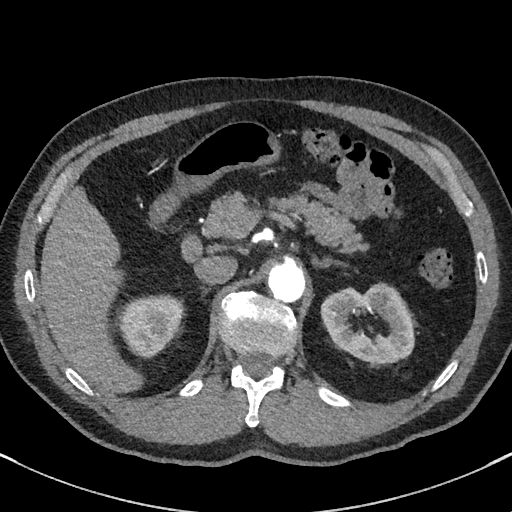
[im 196/339  soft-tissue]
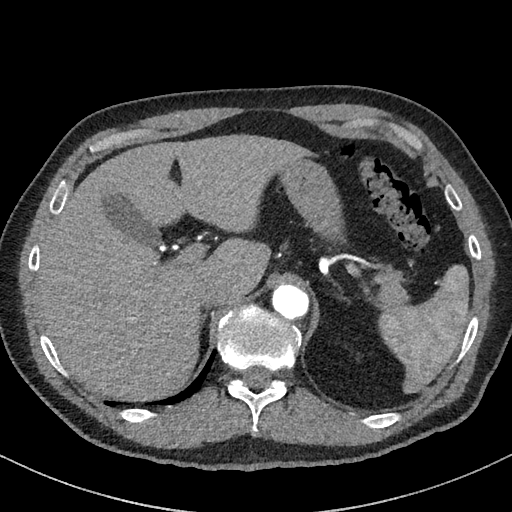
[im 232/339  soft-tissue]
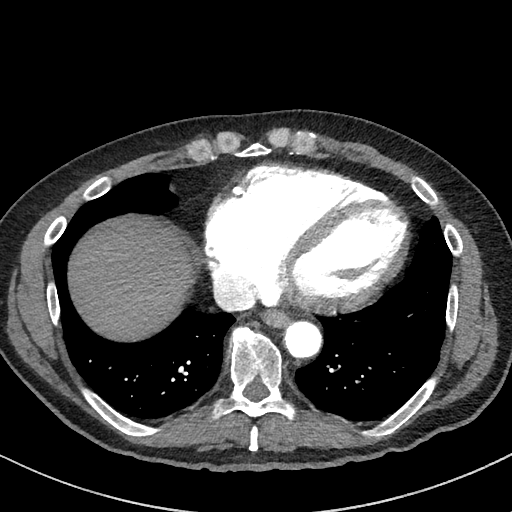
[im 250/339  soft-tissue]
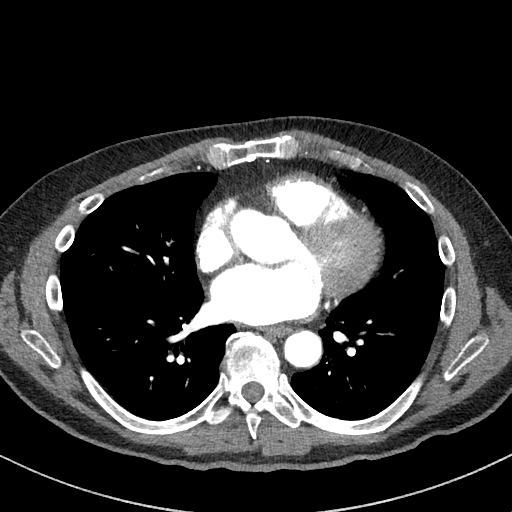
[im 250/339  bone]
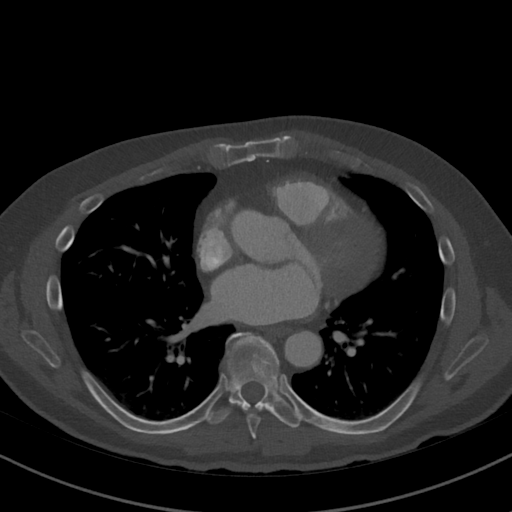
[im 285/339  soft-tissue]
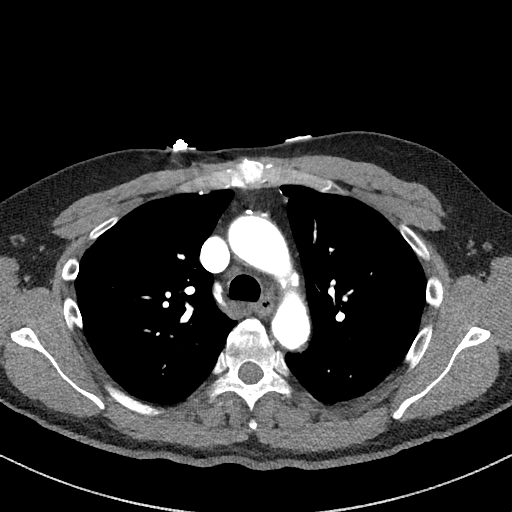
[im 321/339  soft-tissue]
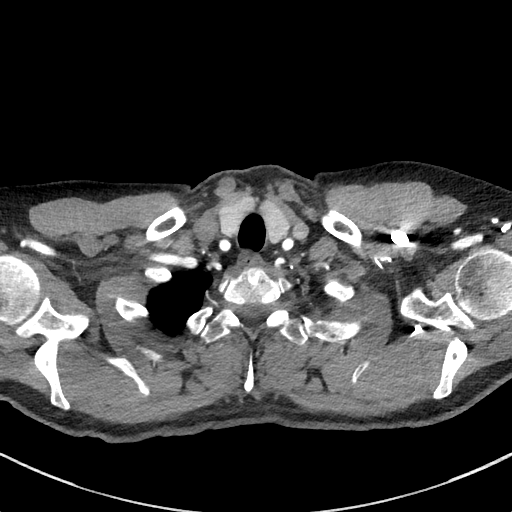

[Series 10: dissection 2mm cor · coronal · 0.79mm/px · 3 of 149 slices shown]
[im 38/149  soft-tissue]
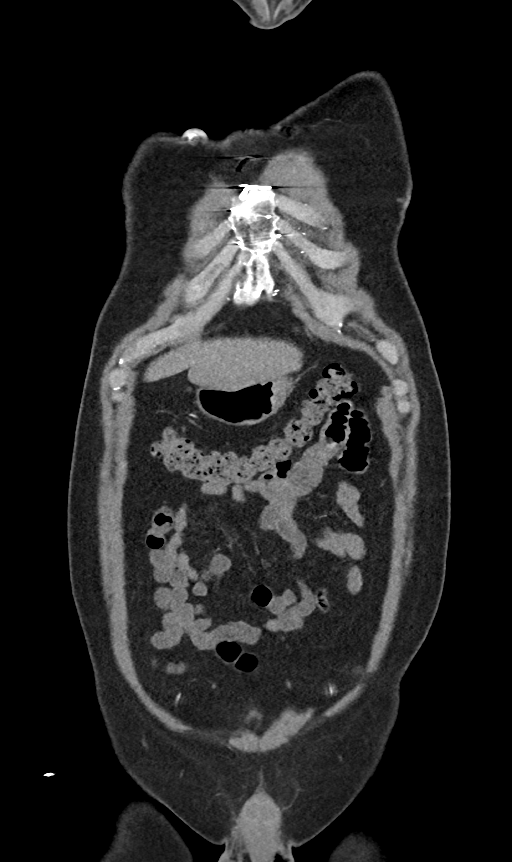
[im 75/149  soft-tissue]
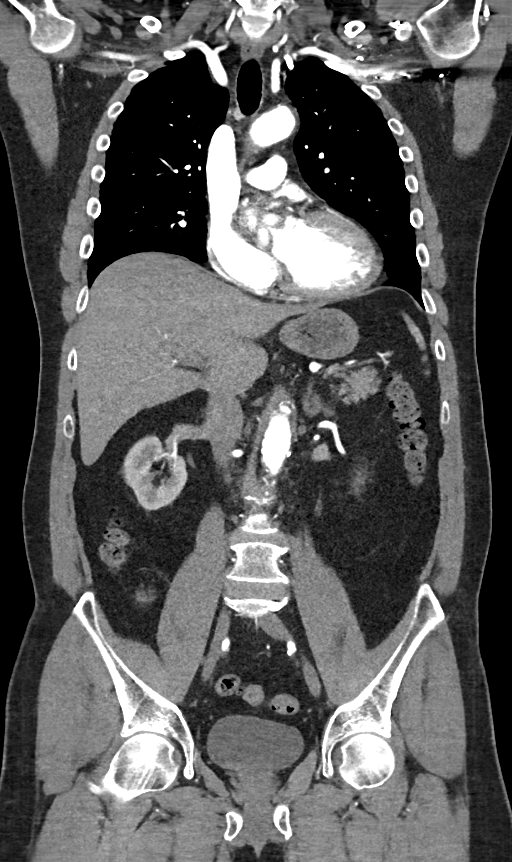
[im 112/149  soft-tissue]
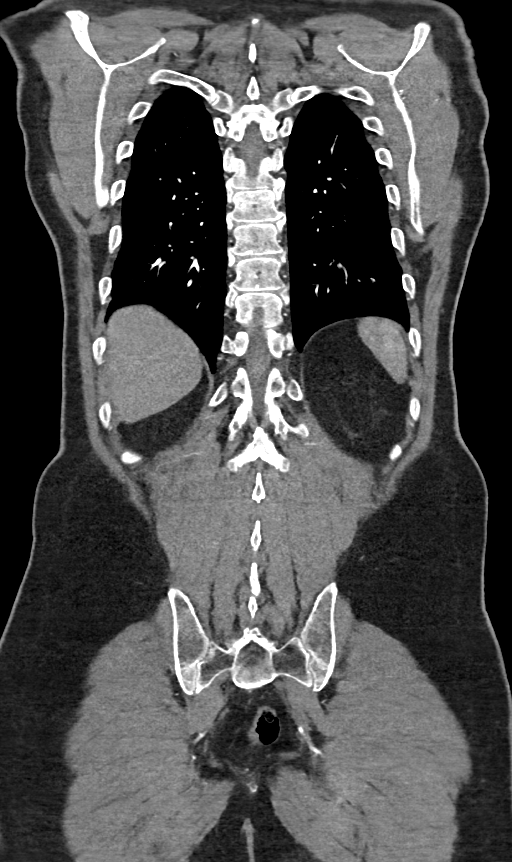

[14 of 46 positions shown; findings below may reference images not displayed]

FINDINGS: CTA CHEST FINDINGS

Cardiovascular: The ascending aorta measures 3.9 x 3.9 cm. There is
mild atherosclerosis throughout the aorta. There is no evidence of
aortic dissection. There is no pulmonary embolus. The heart size is
mildly enlarged. There is no pericardial effusion.

Mediastinum/Nodes: No enlarged mediastinal, hilar, or axillary lymph
nodes. Thyroid gland, trachea, and esophagus demonstrate no
significant findings.

Lungs/Pleura: Lungs are clear. No pleural effusion or pneumothorax.

Musculoskeletal: Degenerative joint changes of the spine are noted.

Review of the MIP images confirms the above findings.

CTA ABDOMEN AND PELVIS FINDINGS

VASCULAR

Aorta: Normal caliber aorta without aneurysm, dissection, vasculitis
or significant stenosis. Mild atherosclerosis of the aorta is
identified. There is dilatation of the right common iliac artery
measuring 1.5 cm.

Celiac: Mild atherosclerosis at the origin without significant
aneurysm or vasculitis.

SMA: Patent without evidence of aneurysm, dissection, vasculitis or
significant stenosis.

Renals: Single bilateral renal arteries are identified. There is
atherosclerosis at the origin of the right renal artery without
significant stenosis. The left renal artery is normal.

IMA: Patent without evidence of aneurysm, dissection, vasculitis or
significant stenosis.

Inflow: Patent without evidence of aneurysm, dissection, vasculitis
or significant stenosis.

Veins: No obvious venous abnormality within the limitations of this
arterial phase study.

Review of the MIP images confirms the above findings.

NON-VASCULAR

Hepatobiliary: No focal liver abnormality is seen. No gallstones,
gallbladder wall thickening, or biliary dilatation.

Pancreas: Unremarkable. No pancreatic ductal dilatation or
surrounding inflammatory changes.

Spleen: Normal in size without focal abnormality.

Adrenals/Urinary Tract: Adrenal glands are unremarkable. Kidneys are
normal, without renal calculi, focal lesion, or hydronephrosis.
Bladder is unremarkable.

Stomach/Bowel: Stomach is within normal limits. Appendix appears
normal. No evidence of bowel wall thickening, distention, or
inflammatory changes.

Lymphatic: Aortic atherosclerosis. No enlarged abdominal or pelvic
lymph nodes.

Reproductive: Prostate is unremarkable.

Other: None.

Musculoskeletal: Degenerative joint changes of the spine are noted.

Review of the MIP images confirms the above findings.
IMPRESSION: No evidence of aortic dissection.

Mild prominence of the ascending aorta measuring 3.9 x 3.9 cm.

No acute abnormality identified in the chest.

No acute abnormality identified in the abdomen and pelvis.

## 2020-07-27 IMAGING — DX DG CHEST 2V
2 series · 2 of 2 positions shown · non-contrast
Comparison: 07/13/2018

CLINICAL DATA: Chest pain

EXAM:
CHEST - 2 VIEW

[chest pa]
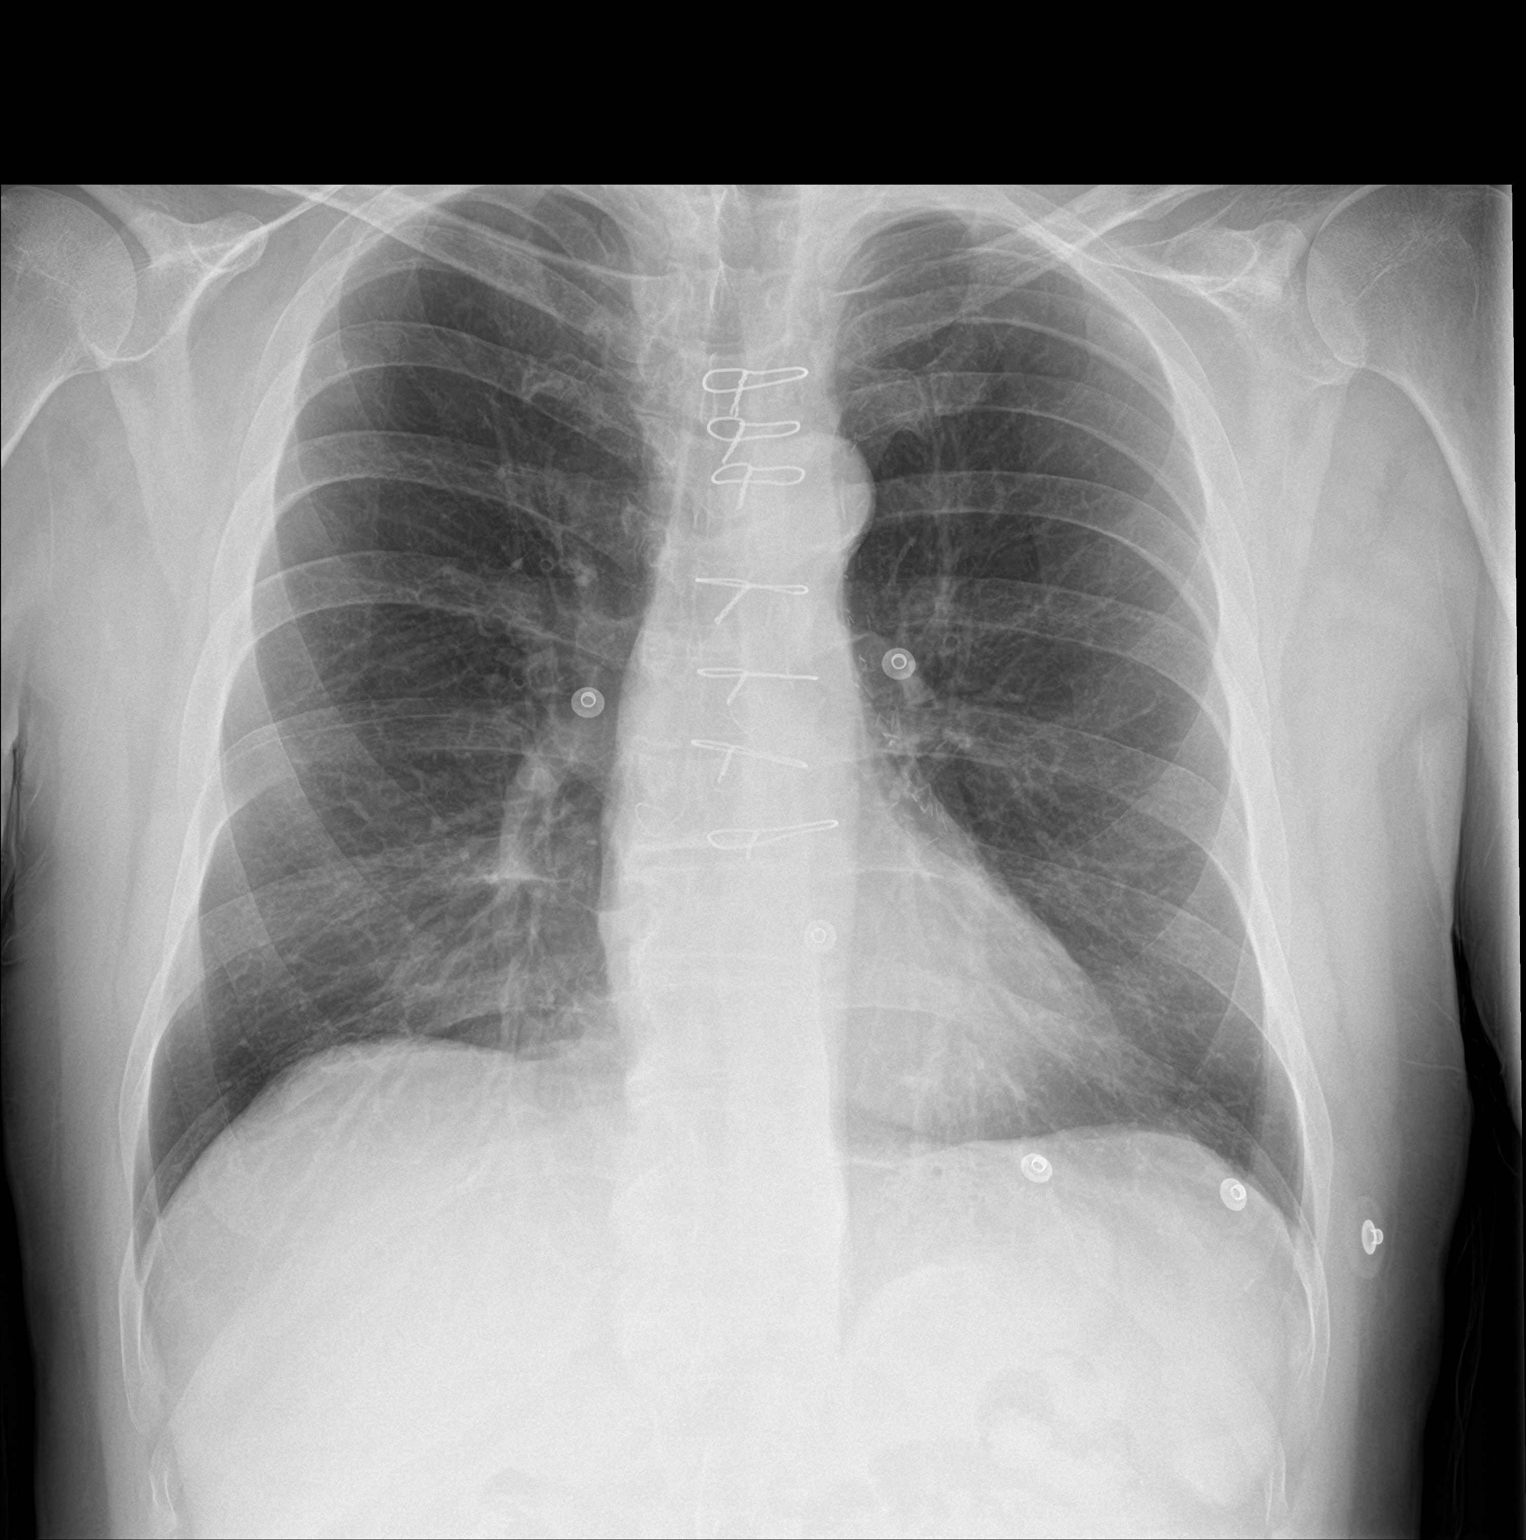

[chest lat]
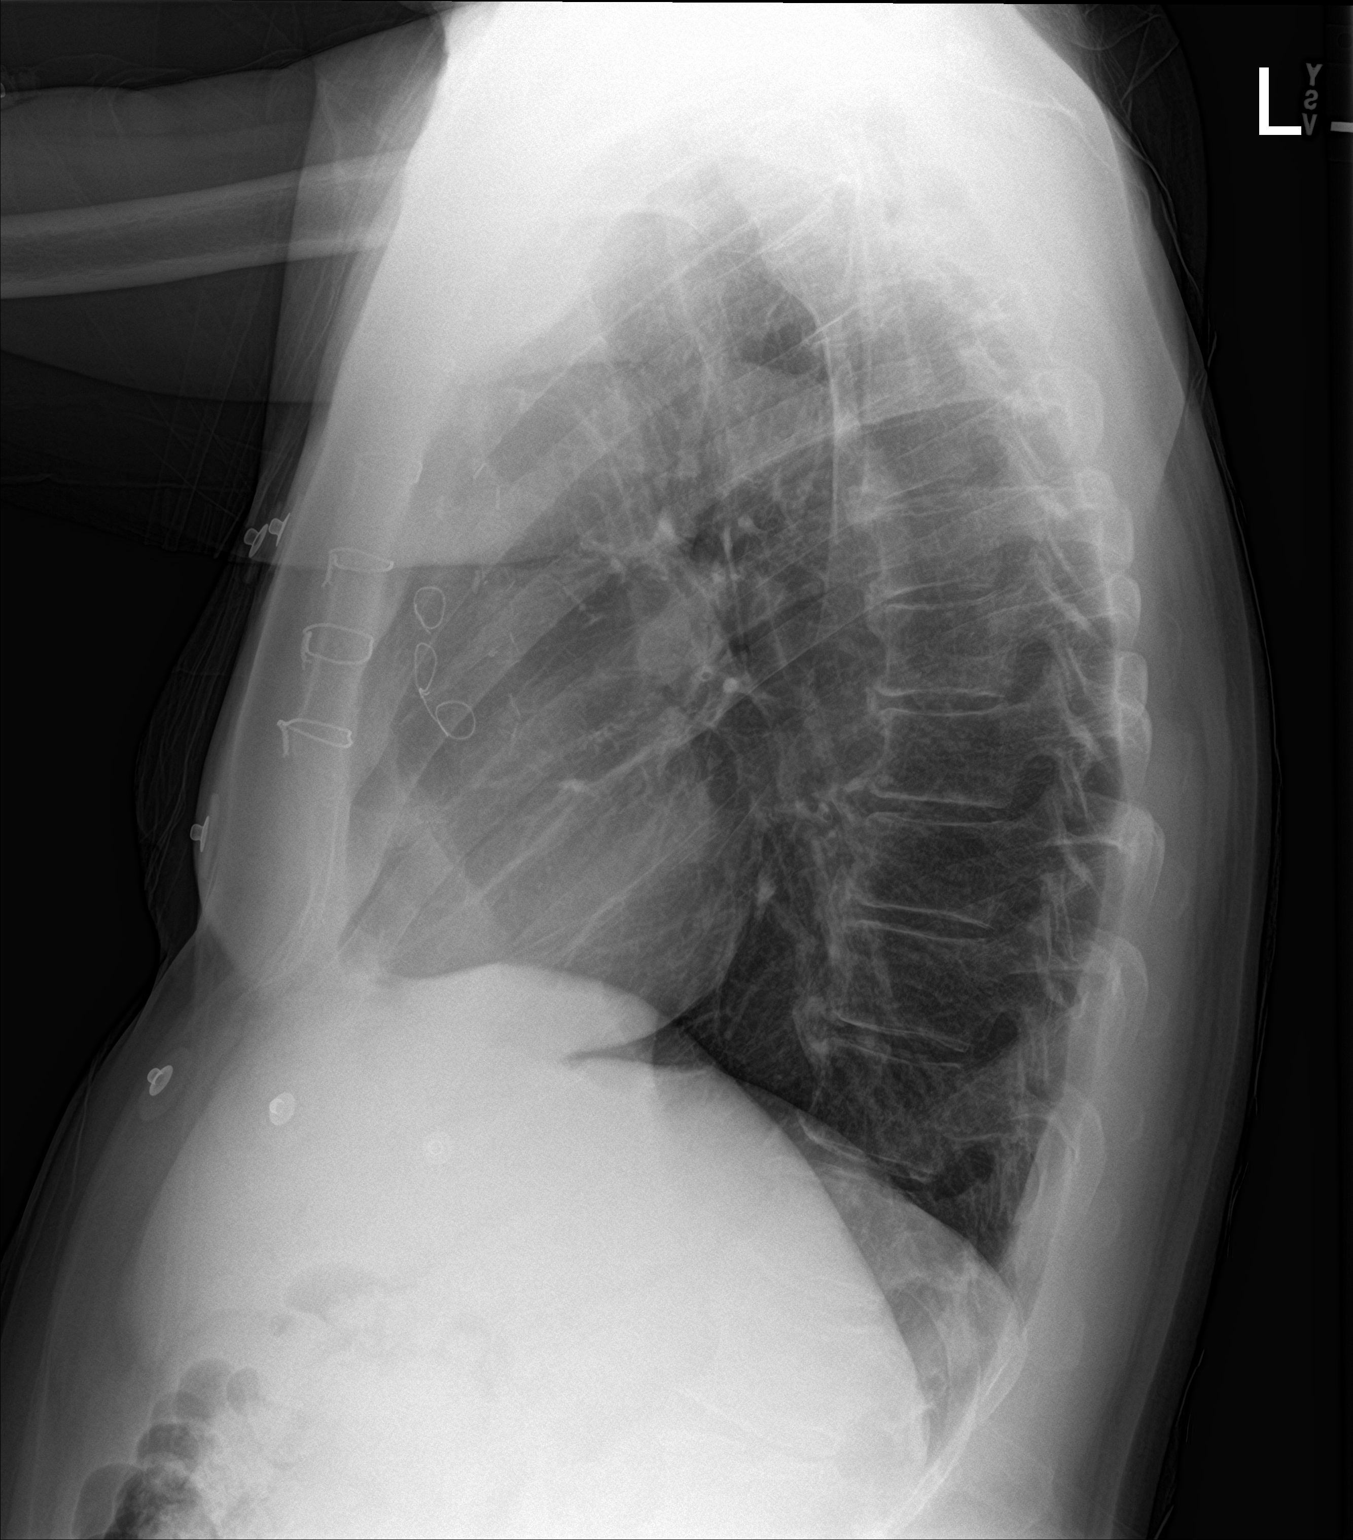

[2 of 2 positions shown; findings below may reference images not displayed]

FINDINGS: Normal heart size and mediastinal contours. CABG. No acute
infiltrate or edema. No effusion or pneumothorax. Spondylosis. No
acute osseous findings. Spondylosis. Artifact from EKG leads.
IMPRESSION: No evidence of active disease.

## 2020-08-14 ENCOUNTER — Encounter: Payer: Self-pay | Admitting: Physical Therapy

## 2020-08-14 ENCOUNTER — Other Ambulatory Visit: Payer: Self-pay

## 2020-08-14 ENCOUNTER — Ambulatory Visit: Payer: Medicare Other | Attending: Specialist | Admitting: Physical Therapy

## 2020-08-14 DIAGNOSIS — R252 Cramp and spasm: Secondary | ICD-10-CM | POA: Insufficient documentation

## 2020-08-14 DIAGNOSIS — M6281 Muscle weakness (generalized): Secondary | ICD-10-CM | POA: Insufficient documentation

## 2020-08-14 DIAGNOSIS — M5441 Lumbago with sciatica, right side: Secondary | ICD-10-CM | POA: Diagnosis not present

## 2020-08-14 NOTE — Therapy (Signed)
[address] [place], [place], [address] Phone: [phone]   Fax:  [phone]  Physical Therapy Evaluation  Patient Details  Name: [name] MRN: [id number] Date of Birth: 1956/01/20 Referring Provider (PT): Beane   Encounter Date: 08/14/2020   PT End of Session - 08/14/20 1030    Visit Number 1    Date for PT Re-Evaluation 10/15/20    PT Start Time 0933    PT Stop Time 1016    PT Time Calculation (min) 43 min    Activity Tolerance Patient tolerated treatment well    Behavior During Therapy [facility] for tasks assessed/performed           Past Medical History:  Diagnosis Date  . Anxiety   . Arthritis   . Basal cell carcinoma (BCC) of forehead   . CAD (coronary artery disease)    a. 10/2015 ant STEMI >> LHC with 3 v CAD; oLAD tx with POBA >> emergent CABG. b. Multiple evals since that time, early graft failure of SVG-RCA by cath 03/2016. c. 2/19 PCI/DES x1 to pRCA, normal EF.  . Carotid artery disease ([place])    a. 40-59% BICA 02/2018.  [place] Kitchen Depression   . Dyspnea   . Ectopic atrial tachycardia ([place])   . Esophageal reflux    eosinophil esophagitis  . Family history of adverse reaction to anesthesia    "sister has PONV" (06/21/2017)  . Former tobacco use   . Gout   . Hepatitis C    "treated and cured" (06/21/2017)  . High cholesterol   . History of kidney stones   . Hypertension   . Ischemic cardiomyopathy    a. EF 25-30% at intraop TEE 4/17  //  b. Limited Echo 5/17 - EF 45-50%, mild ant HK. c. EF 55-65% by cath 09/2017.  . Migraine    "3-4/yr" (06/21/2017)  . Myocardial infarction ([place]) 10/2015  . Palpitations   . Sinus bradycardia    a. HR dropping into 40s in 02/2016 -> BB reduced.  . Stroke ([place]) 10/2016   "small one; sometimes my memory/cognitive issues" (06/21/2017)  . Symptomatic hypotension    a. 02/2016 ER visit -> meds reduced.  . Syncope     Past Surgical History:  Procedure  Laterality Date  . ANTERIOR CERVICAL DECOMP/DISCECTOMY FUSION N/A 10/17/2018   Procedure: Anterior Cervical Decompression Fusion - Cervical seven -Thoracic one;  Surgeon: Consuella Lose, MD;  Location: [facility];  Service: Neurosurgery;  Laterality: N/A;  . BASAL CELL CARCINOMA EXCISION     "forehead  . BIOPSY  07/20/2019   Procedure: BIOPSY;  Surgeon: Carol Ada, MD;  Location: [facility];  Service: Endoscopy;;  . CARDIAC CATHETERIZATION N/A 11/28/2015   Procedure: Left Heart Cath and Coronary Angiography;  Surgeon: [name], MD;  Location: [place] [facility];  Service: Cardiovascular;  Laterality: N/A;  . CARDIAC CATHETERIZATION N/A 11/28/2015   Procedure: Coronary Balloon Angioplasty;  Surgeon: [name], MD;  Location: [place] [facility];  Service: Cardiovascular;  Laterality: N/A;  ostial LAD  . CARDIAC CATHETERIZATION N/A 11/28/2015   Procedure: Coronary/Graft Angiography;  Surgeon: [name], MD;  Location: [place] [facility];  Service: Cardiovascular;  Laterality: N/A;  coronaries only   . CARDIAC CATHETERIZATION N/A 04/21/2016   Procedure: Left Heart Cath and Coronary Angiography;  Surgeon: Wellington Hampshire, MD;  Location: [place] [facility];  Service: Cardiovascular;  Laterality: N/A;  . CARDIAC CATHETERIZATION N/A  06/14/2016   Procedure: Left Heart Cath and Cors/Grafts Angiography;  Surgeon: Lorretta Harp, MD;  Location: [place] [facility];  Service: Cardiovascular;  Laterality: N/A;  . CARDIAC CATHETERIZATION N/A 09/08/2016   Procedure: Left Heart Cath and Cors/Grafts Angiography;  Surgeon: Wellington Hampshire, MD;  Location: [place] [facility];  Service: Cardiovascular;  Laterality: N/A;  . CARDIAC CATHETERIZATION    . CORONARY ARTERY BYPASS GRAFT N/A 11/28/2015   Procedure: CORONARY ARTERY BYPASS GRAFTING (CABG) TIMES FIVE USING LEFT INTERNAL MAMMARY ARTERY AND RIGHT GREATER SAPHENOUS,VIEN HARVEATED BY ENDOVIEN, INTRAOPPRATIVE TEE;  Surgeon: Gaye Pollack, MD;  Location: [place];  Service: Open Heart Surgery;  Laterality: N/A;  . CORONARY STENT INTERVENTION N/A 10/05/2017   Procedure: CORONARY STENT INTERVENTION;  Surgeon: [name], MD;  Location: [place] [facility];  Service: Cardiovascular;  Laterality: N/A;  . ESOPHAGOGASTRODUODENOSCOPY (EGD) WITH PROPOFOL N/A 07/20/2019   Procedure: ESOPHAGOGASTRODUODENOSCOPY (EGD) WITH PROPOFOL;  Surgeon: Carol Ada, MD;  [facility];  Service: Endoscopy;  Laterality: N/A;  . HUMERUS SURGERY Right 1969   "tumor inside bone; filled it w/bone chips"  . LEFT HEART CATH AND CORS/GRAFTS ANGIOGRAPHY N/A 03/11/2017   Procedure: Left Heart Cath and Cors/Grafts Angiography;  Surgeon: Leonie Man, MD;  Location: [place] [facility];  Service: Cardiovascular;  Laterality: N/A;  . LEFT HEART CATH AND CORS/GRAFTS ANGIOGRAPHY N/A 10/05/2017   Procedure: LEFT HEART CATH AND CORS/GRAFTS ANGIOGRAPHY;  Surgeon: [name], MD;  Location: [facility];  Service: Cardiovascular;  Laterality: N/A;  . LEFT HEART CATH AND CORS/GRAFTS ANGIOGRAPHY N/A 04/11/2019   Procedure: LEFT HEART CATH AND CORS/GRAFTS ANGIOGRAPHY;  Surgeon: [name], MD;  Location: [facility] [facility];  Service: Cardiovascular;  Laterality: N/A;  . PERIPHERAL VASCULAR CATHETERIZATION N/A 06/14/2016   Procedure: Lower Extremity Angiography;  Surgeon: Lorretta Harp, MD;  Location: [facility];  Service: Cardiovascular;  Laterality: N/A;    There were no vitals filed for this visit.    Subjective Assessment - 08/14/20 0936    Subjective Pt reports to clinic with reports of LBP beginning 2.5-3 weeks ago. States that pain is predominantly in R LB and radiates to R anterior groin. Occasional N/T in R lateral leg. Has experienced one instance of N/T in L lateral leg. Pt states that he had episode of kidney stones earlier this year; originally thought that back pain was another kidney stone but had  imaging and was neg for kidney problems. Pt reports he works in [employer]; pain begins at beginning of day and gets worse as the day goes on. Has trouble with prolonged sitting and driving. Walking helps a little.    Limitations Sitting    How long can you sit comfortably? immediately with sitting    Diagnostic tests xrays    Patient Stated Goals reduce pain    Currently in Pain? Yes    Pain Score 5     Pain Location Back    Pain Orientation Right;Lower    Pain Descriptors / Indicators Aching;Sharp    Pain Type Acute pain    Pain Radiating Towards R anterior groin and R lateral leg    Pain Onset 1 to 4 weeks ago    Pain Frequency Constant    Aggravating Factors  prolonged sitting, prolonged standing, bending    Pain Relieving Factors rest, heat              [facility] PT Assessment -  08/14/20 0001      Assessment   Medical Diagnosis LBP with radiating pain    Referring Provider (PT) Beane    Next MD Visit 09/03/2020   MRI scheduled   Prior Therapy none      Precautions   Precautions None      Restrictions   Weight Bearing Restrictions No      Balance Screen   Has the patient fallen in the past 6 months No    Has the patient had a decrease in activity level because of a fear of falling?  No    Is the patient reluctant to leave their home because of a fear of falling?  No      Home Environment   Additional Comments housework, yardwork      Prior Function   Level of Independence Independent    Vocation Full time employment    Vocation Requirements works predominantly office work at [employer] walking, light exercise      International aid/development worker Tests   Functional tests Sit to Stand      Sit to Stand   Comments [facility]      Posture/Postural Control   Posture/Postural Control Postural limitations    Postural Limitations Decreased lumbar lordosis    Posture Comments leaning to L in sitting      ROM / Strength    AROM / PROM / Strength AROM;Strength      AROM   Overall AROM Comments lumbar rotation, SB, extension limited 50% d/t pain RLB; lumbar flexion [facility]      Strength   Strength Assessment Site Hip;Knee;Ankle    Right/Left Hip Right;Left    Right Hip Flexion 4+/5    Right Hip Extension 4/5    Right Hip ABduction 4/5    Left Hip Flexion 5/5    Left Hip Extension 4+/5    Left Hip ABduction 4+/5    Right/Left Knee Right;Left    Right Knee Flexion 4/5    Right Knee Extension 5/5    Left Knee Flexion 5/5    Left Knee Extension 5/5    Right/Left Ankle Right;Left    Right Ankle Dorsiflexion 5/5    Left Ankle Dorsiflexion 5/5      Flexibility   Soft Tissue Assessment /Muscle Length yes    Hamstrings tight B    Quadriceps tight    ITB tight    Piriformis tight      Palpation   Spinal mobility hypomobility lumbar spine    Palpation comment very tender to palpation R lumbar paraspinals; mild tenderness to palpation R glute/piriformis      Special Tests    Special Tests Hip Special Tests    Other special tests SLR 70 deg B    Hip Special Tests  Saralyn Pilar (FABER) Test;Hip Scouring      Saralyn Pilar (FABER) Test   Findings Positive    Side Right      Hip Scouring   Findings Positive    Side Right    Comments noted mild pressure/pain in R hip joint anteriorly      Ambulation/Gait   Gait Comments mild antalgic gait on RLE                      Objective measurements completed on examination: See above findings.       [facility] Adult PT Treatment/Exercise - 08/14/20 0001  Exercises   Exercises Lumbar      Lumbar Exercises: Stretches   Active Hamstring Stretch Right;Left;1 rep;20 seconds    Single Knee to Chest Stretch Right;1 rep;10 seconds    Lower Trunk Rotation 5 reps;10 seconds    Hip Flexor Stretch Right;1 rep;20 seconds    Piriformis Stretch Right;1 rep;20 seconds    Other Lumbar Stretch Exercise hip adductor stretch supine x30 sec                   PT Education - 08/14/20 1030    Education Details Pt educated on POC and HEP    Person(s) Educated Patient    Methods Explanation;Demonstration;Handout    Comprehension Verbalized understanding;Returned demonstration            PT Short Term Goals - 08/14/20 1041      PT SHORT TERM GOAL #1   Title Pt will be I with initial HEP    Time 2    Period Weeks    Status New    Target Date 08/28/20             PT Long Term Goals - 08/14/20 1041      PT LONG TERM GOAL #1   Title Pt will be I with advanced HEP    Time 6    Period Weeks    Status New    Target Date 09/25/20      PT LONG TERM GOAL #2   Title Pt will report 50% reduction in LBP    Time 6    Period Weeks    Status New    Target Date 09/25/20      PT LONG TERM GOAL #3   Title Pt will demo lumbar AROM WFL </= 1/10 LBP    Time 6    Period Weeks    Status New    Target Date 09/25/20      PT LONG TERM GOAL #4   Title Pt will report able to complete household ADLs and work duties with </= 1/10 LBP    Time 6    Period Weeks    Status New    Target Date 09/25/20                  Plan - 08/14/20 1031    Clinical Impression Statement Pt presents to clinic with reports of LBP present for the past 2-3 weeks with pain occasionally radiating to R anterior groin and R lateral leg. Pt reports intermittent N/T B lateral leg R>L. Denies saddle anesthesia. Pt demos positive SLR 70 deg B along with positive hip scour and FABER on RLE. Pt demos tightness in R hip IR/ER, limited/painful lumbar AROM, tenderness to palpation B lumbar paraspinals R>L, weakness RLE compared to LLE MMT, and mild antalgic gait. Pt also demos core weakness/instability of lumbar spine. Pt would benefit from skilled PT to address the above impairments.    Examination-Activity Limitations Bend;Stand;Lift;Locomotion Level    Examination-Participation Restrictions Occupation;Community Activity;Driving;Interpersonal Relationship     Stability/Clinical Decision Making Stable/Uncomplicated    Clinical Decision Making Low    Rehab Potential Good    PT Frequency 2x / week    PT Duration 6 weeks    PT Treatment/Interventions ADLs/Self Care Home Management;Electrical Stimulation;Iontophoresis 4mg /ml Dexamethasone;Moist Heat;Therapeutic exercise;Therapeutic activities;Traction;Patient/family education;Manual techniques;Dry needling    PT Next Visit Plan slowly introduce gym activities, core/lumbar stab ex's, LE flexibility, manual/modalities as indicated    PT Home Exercise Plan LTR, piriformis stretch, hip  flexor stretch, hip adductor stretch, hamstring stretch    Consulted and Agree with Plan of Care Patient           Patient will benefit from skilled therapeutic intervention in order to improve the following deficits and impairments:  Abnormal gait,Decreased range of motion,Increased muscle spasms,Decreased activity tolerance,Pain,Hypomobility,Impaired flexibility,Decreased strength  Visit Diagnosis: Acute right-sided low back pain with right-sided sciatica  Muscle weakness (generalized)  Cramp and spasm     Problem List Patient Active Problem List   Diagnosis Date Noted  . Chest pain 11/07/2019  . Gastroesophageal reflux disease   . Cervical radiculopathy 10/17/2018  . Preoperative clearance 10/04/2018  . Palpitations   . Coronary artery disease involving coronary bypass graft of native heart with angina pectoris ([place])   . Transient loss of consciousness 03/24/2018  . Ectopic atrial tachycardia ([place]) 02/09/2018  . Central chest pain 03/10/2017  . Family hx-stroke 11/10/2016  . Stroke-like episode ([place]) - R brain, s/p tPA 11/09/2016  . Unstable angina ([place]) 09/07/2016  . Claudication of both lower extremities ([place])   . Pure hypercholesterolemia   . Tobacco abuse disorder   . CAD of autologous artery bypass graft without angina   . Chest pain at rest 06/10/2016  . Abnormal nuclear stress test - HIGH RISK  04/20/2016  . Old MI (myocardial infarction)   . Essential hypertension 02/26/2016  . Ischemic cardiomyopathy 12/25/2015  . Hyperlipidemia LDL goal <70 12/25/2015  . 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 Coronary artery disease involving native coronary artery of native heart with angina pectoris ([place]) 11/28/2015  . 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 Acute MI anterior wall first episode care [facility])   . Precordial chest pain 03/07/2015  . Dysphagia 03/07/2015  . Gout attack 03/07/2015  . Mixed bipolar I disorder ([place]) 03/07/2015  . Fibromyalgia 07/09/2014  . Gout 07/09/2014  . Anxiety 07/09/2014  . Depression 07/09/2014  . Hepatitis C 11/20/2012  . Eosinophilic esophagitis 77/10/4033   Amador Cunas, PT, DPT Donald Prose Maddyn Lieurance 08/14/2020, 10:43 AM  [address] [place], [place], [address] Phone: [phone]   Fax:  [phone]  Name: Jastin Fore MRN: [id number] Date of Birth: 11-Apr-1956

## 2020-08-14 NOTE — Patient Instructions (Signed)
Access Code: [email or web address] URL: [email or web address] Date: 08/14/2020 Prepared by: Amador Cunas  Exercises Supine Lower Trunk Rotation - 1 x daily - 7 x weekly - 3 sets - 10 reps - 5 sec hold Hip Flexor Stretch at Edge of Bed - 1 x daily - 7 x weekly - 3 sets - 2 reps - 20 sec hold Supine Hip Adductor Stretch - 1 x daily - 7 x weekly - 3 sets - 2 reps - 20-30 sec hold Seated Table Hamstring Stretch - 1 x daily - 7 x weekly - 3 sets - 2 reps - 20-30 sec hold

## 2020-08-15 IMAGING — RF DG CERVICAL SPINE 2 OR 3 VIEWS
1 series · 2 of 2 positions shown · non-contrast
Comparison: None.

CLINICAL DATA: Cervical fusion at C7-T1.

FLUOROSCOPY TIME:  12 seconds.
Images: 2
EXAM:
DG C-ARM 61-120 MIN

[Series 1: run · 2 of 2 slices shown]
[im 1/2]
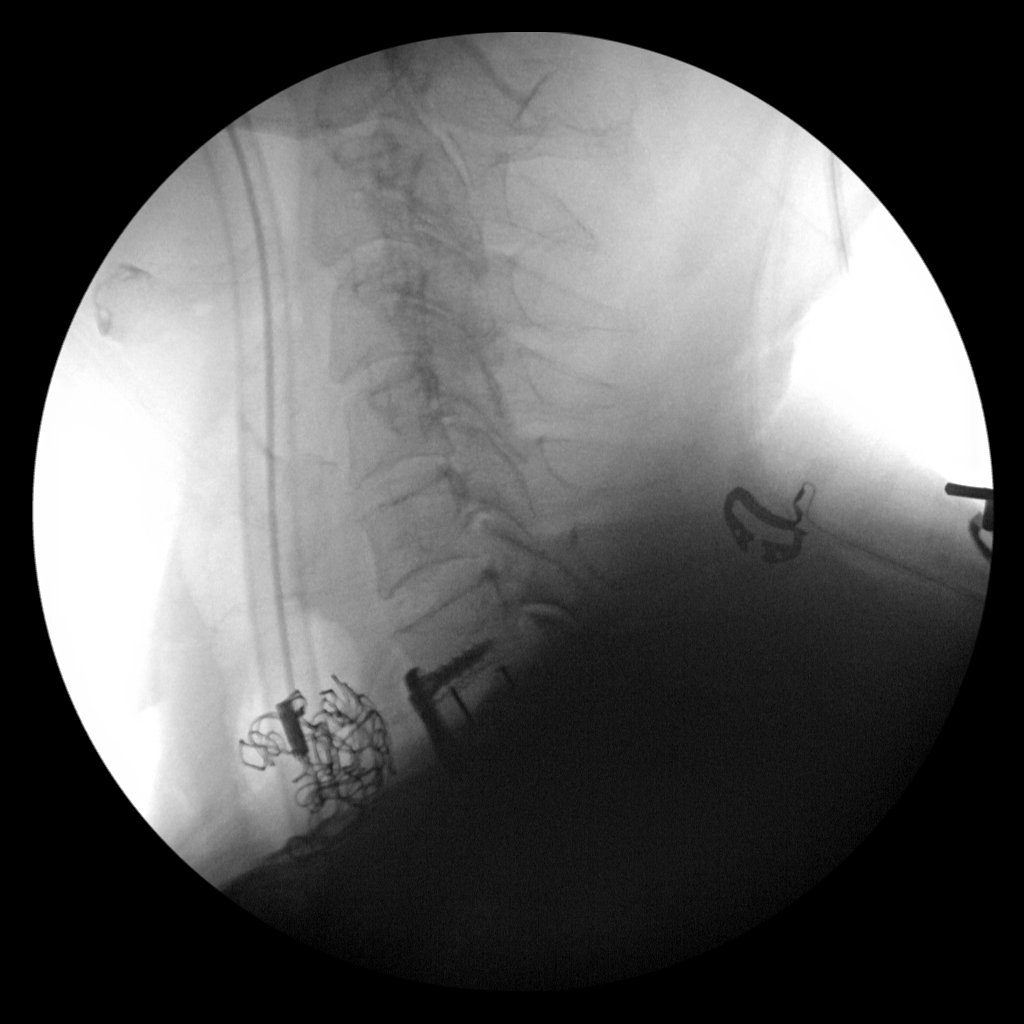
[im 2/2]
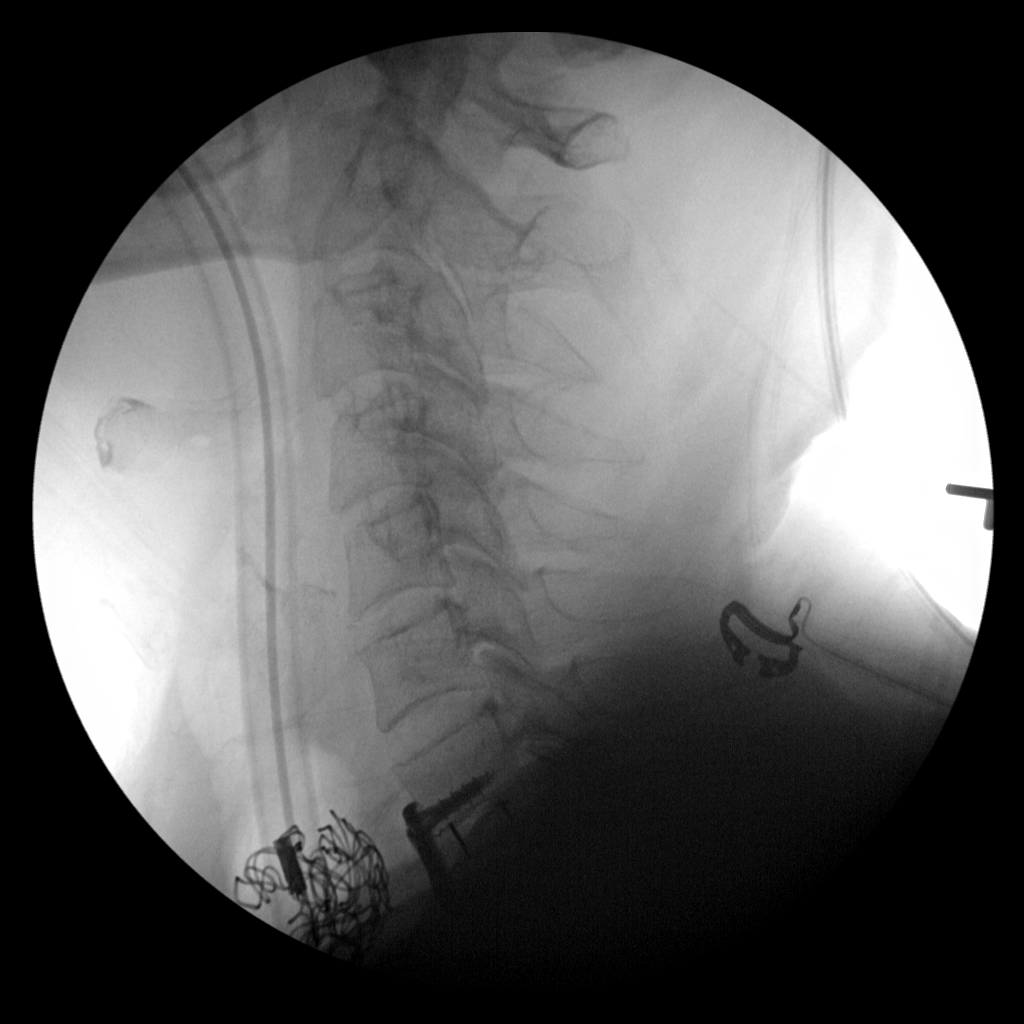

[2 of 2 positions shown; findings below may reference images not displayed]

FINDINGS: The patient is status post cervical fusion at C7-T1 with an anterior
plate. The screw at T1 is largely obscured by overlapping soft
tissues. There is also a disc spacer device at C7-T1. The disc space
is partially obscured by overlapping soft tissues but the disc
spacer device appears to be in good position within visualized
limits.
IMPRESSION: Limited visualization of C7-T1 due to overlapping soft tissues.
Within these limitations, the anterior plate and screws and the disc
spacer device appear to be in good position. The T1 screw in
particular is difficult to visualize on this study however.

## 2020-08-18 ENCOUNTER — Ambulatory Visit: Payer: Medicare Other | Admitting: Physical Therapy

## 2020-08-21 ENCOUNTER — Encounter: Payer: Self-pay | Admitting: Physical Therapy

## 2020-08-21 ENCOUNTER — Ambulatory Visit: Payer: Medicare Other | Admitting: Physical Therapy

## 2020-08-21 ENCOUNTER — Other Ambulatory Visit: Payer: Self-pay

## 2020-08-21 DIAGNOSIS — M5441 Lumbago with sciatica, right side: Secondary | ICD-10-CM

## 2020-08-21 DIAGNOSIS — R252 Cramp and spasm: Secondary | ICD-10-CM

## 2020-08-21 DIAGNOSIS — M6281 Muscle weakness (generalized): Secondary | ICD-10-CM

## 2020-08-21 NOTE — Patient Instructions (Signed)
Trigger Point Dry Needling  . What is Trigger Point Dry Needling (DN)? o DN is a physical therapy technique used to treat muscle pain and dysfunction. Specifically, DN helps deactivate muscle trigger points (muscle knots).  o A thin filiform needle is used to penetrate the skin and stimulate the underlying trigger point. The goal is for a local twitch response (LTR) to occur and for the trigger point to relax. No medication of any kind is injected during the procedure.   . What Does Trigger Point Dry Needling Feel Like?  o The procedure feels different for each individual patient. Some patients report that they do not actually feel the needle enter the skin and overall the process is not painful. Very mild bleeding may occur. However, many patients feel a deep cramping in the muscle in which the needle was inserted. This is the local twitch response.   . How Will I feel after the treatment? o Soreness is normal, and the onset of soreness may not occur for a few hours. Typically this soreness does not last longer than two days.  o Bruising is uncommon, however; ice can be used to decrease any possible bruising.  o In rare cases feeling tired or nauseous after the treatment is normal. In addition, your symptoms may get worse before they get better, this period will typically not last longer than 24 hours.   . What Can I do After My Treatment? o Increase your hydration by drinking more water for the next 24 hours. o You may place ice or heat on the areas treated that have become sore, however, do not use heat on inflamed or bruised areas. Heat often brings more relief post needling. o You can continue your regular activities, but vigorous activity is not recommended initially after the treatment for 24 hours. o DN is best combined with other physical therapy such as strengthening, stretching, and other therapies.   

## 2020-08-21 NOTE — Therapy (Addendum)
[facility] [place], [place], [address] Phone: [phone]   Fax:  [phone]  Physical Therapy Treatment  Patient Details  Name: [name] MRN: [id number] Date of Birth: May 11, 1956 Referring Provider (PT): [place]   Encounter Date: 08/21/2020   PT End of Session - 08/21/20 0919    Visit Number 2    Date for PT Re-Evaluation 10/15/20    PT Start Time 0845    PT Stop Time 0943    PT Time Calculation (min) 58 min    Activity Tolerance Patient tolerated treatment well    Behavior During Therapy [facility] for tasks assessed/performed           Past Medical History:  Diagnosis Date  . Anxiety   . Arthritis   . Basal cell carcinoma (BCC) of forehead   . CAD (coronary artery disease)    a. 10/2015 ant STEMI >> LHC with 3 v CAD; oLAD tx with POBA >> emergent CABG. b. Multiple evals since that time, early graft failure of SVG-RCA by cath 03/2016. c. 2/19 PCI/DES x1 to pRCA, normal EF.  . Carotid artery disease ([place])    a. 40-59% BICA 02/2018.  Marland Kitchen Depression   . Dyspnea   . Ectopic atrial tachycardia ([place])   . Esophageal reflux    eosinophil esophagitis  . Family history of adverse reaction to anesthesia    "sister has PONV" (06/21/2017)  . Former tobacco use   . Gout   . Hepatitis C    "treated and cured" (06/21/2017)  . High cholesterol   . History of kidney stones   . Hypertension   . Ischemic cardiomyopathy    a. EF 25-30% at intraop TEE 4/17  //  b. Limited Echo 5/17 - EF 45-50%, mild ant HK. c. EF 55-65% by cath 09/2017.  . Migraine    "3-4/yr" (06/21/2017)  . Myocardial infarction ([place]) 10/2015  . Palpitations   . Sinus bradycardia    a. HR dropping into 40s in 02/2016 -> BB reduced.  . Stroke ([place]) 10/2016   "small one; sometimes my memory/cognitive issues" (06/21/2017)  . Symptomatic hypotension    a. 02/2016 ER visit -> meds reduced.  . Syncope     Past Surgical History:  Procedure  Laterality Date  . ANTERIOR CERVICAL DECOMP/DISCECTOMY FUSION N/A 10/17/2018   Procedure: Anterior Cervical Decompression Fusion - Cervical seven -Thoracic one;  Surgeon: Consuella Lose, MD;  Location: [facility];  Service: Neurosurgery;  Laterality: N/A;  . BASAL CELL CARCINOMA EXCISION     "forehead  . BIOPSY  07/20/2019   Procedure: BIOPSY;  Surgeon: Carol Ada, MD;  Location: [facility];  Service: Endoscopy;;  . CARDIAC CATHETERIZATION N/A 11/28/2015   Procedure: Left Heart Cath and Coronary Angiography;  Surgeon: [name], MD;  Location: [facility];  Service: Cardiovascular;  Laterality: N/A;  . CARDIAC CATHETERIZATION N/A 11/28/2015   Procedure: Coronary Balloon Angioplasty;  Surgeon: [name], MD;  Location: [facility];  Service: Cardiovascular;  Laterality: N/A;  ostial LAD  . CARDIAC CATHETERIZATION N/A 11/28/2015   Procedure: Coronary/Graft Angiography;  Surgeon: [name], MD;  Location: [facility];  Service: Cardiovascular;  Laterality: N/A;  coronaries only   . CARDIAC CATHETERIZATION N/A 04/21/2016   Procedure: Left Heart Cath and Coronary Angiography;  Surgeon: Wellington Hampshire, MD;  Location: [facility];  Service: Cardiovascular;  Laterality: N/A;  . CARDIAC CATHETERIZATION N/A  06/14/2016   Procedure: Left Heart Cath and Cors/Grafts Angiography;  Surgeon: Lorretta Harp, MD;  Location: [facility];  Service: Cardiovascular;  Laterality: N/A;  . CARDIAC CATHETERIZATION N/A 09/08/2016   Procedure: Left Heart Cath and Cors/Grafts Angiography;  Surgeon: Wellington Hampshire, MD;  Location: [facility];  Service: Cardiovascular;  Laterality: N/A;  . CARDIAC CATHETERIZATION    . CORONARY ARTERY BYPASS GRAFT N/A 11/28/2015   Procedure: CORONARY ARTERY BYPASS GRAFTING (CABG) TIMES FIVE USING LEFT INTERNAL MAMMARY ARTERY AND RIGHT GREATER SAPHENOUS,VIEN HARVEATED BY ENDOVIEN, INTRAOPPRATIVE TEE;  Surgeon: Gaye Pollack, MD;  Location: [facility];  Service: Open Heart Surgery;  Laterality: N/A;  . CORONARY STENT INTERVENTION N/A 10/05/2017   Procedure: CORONARY STENT INTERVENTION;  Surgeon: [name], MD;  Location: [facility];  Service: Cardiovascular;  Laterality: N/A;  . ESOPHAGOGASTRODUODENOSCOPY (EGD) WITH PROPOFOL N/A 07/20/2019   Procedure: ESOPHAGOGASTRODUODENOSCOPY (EGD) WITH PROPOFOL;  Surgeon: Carol Ada, MD;  [facility];  Service: Endoscopy;  Laterality: N/A;  . HUMERUS SURGERY Right 1969   "tumor inside bone; filled it w/bone chips"  . LEFT HEART CATH AND CORS/GRAFTS ANGIOGRAPHY N/A 03/11/2017   Procedure: Left Heart Cath and Cors/Grafts Angiography;  Surgeon: Leonie Man, MD;  Location: [facility];  Service: Cardiovascular;  Laterality: N/A;  . LEFT HEART CATH AND CORS/GRAFTS ANGIOGRAPHY N/A 10/05/2017   Procedure: LEFT HEART CATH AND CORS/GRAFTS ANGIOGRAPHY;  Surgeon: [name], MD;  Location: [facility];  Service: Cardiovascular;  Laterality: N/A;  . LEFT HEART CATH AND CORS/GRAFTS ANGIOGRAPHY N/A 04/11/2019   Procedure: LEFT HEART CATH AND CORS/GRAFTS ANGIOGRAPHY;  Surgeon: [name], MD;  Location: [facility];  Service: Cardiovascular;  Laterality: N/A;  . PERIPHERAL VASCULAR CATHETERIZATION N/A 06/14/2016   Procedure: Lower Extremity Angiography;  Surgeon: Lorretta Harp, MD;  Location: [facility];  Service: Cardiovascular;  Laterality: N/A;    There were no vitals filed for this visit.   Subjective Assessment - 08/21/20 0844    Subjective Exercises has been helping some, both legs ache at night waking him up at times.    Currently in Pain? No/denies                             [facility] Adult PT Treatment/Exercise - 08/21/20 0001      Lumbar Exercises: Stretches   Active Hamstring Stretch Left;Right;4 reps;10 seconds    Single Knee to Chest Stretch Left;Right;3 reps    Piriformis  Stretch Right;20 seconds;3 reps;Left;10 seconds      Lumbar Exercises: Aerobic   Nustep L4 x 6 min      Lumbar Exercises: Machines for Strengthening   Cybex Knee Extension 10lb 2x10    Cybex Knee Flexion 20lb 2x10      Lumbar Exercises: Standing   Row Theraband;15 reps;Both;Strengthening   x2   Theraband Level (Row) Level 4 (Blue)    Shoulder Extension Theraband;20 reps;Strengthening;Both    Theraband Level (Shoulder Extension) Level 3 (Green)    Other Standing Lumbar Exercises ER red 2x10      Modalities   Modalities Moist Heat      Moist Heat Therapy   Number Minutes Moist Heat 10 Minutes    Moist Heat Location Lumbar Spine      Manual Therapy   Manual Therapy Soft tissue mobilization    Manual therapy comments skilled palpation for  DN at the L2-4 level on right    Soft tissue mobilization STM to the right lumbar area            Trigger Point Dry Needling - 08/21/20 0001    Consent Given? Yes    Education Handout Provided Yes    Muscles Treated Back/Hip Erector spinae    Erector spinae Response Twitch response elicited;Palpable increased muscle length                  PT Short Term Goals - 08/21/20 0926      PT SHORT TERM GOAL #1   Title Pt will be I with initial HEP    Status Achieved             PT Long Term Goals - 08/14/20 1041      PT LONG TERM GOAL #1   Title Pt will be I with advanced HEP    Time 6    Period Weeks    Status New    Target Date 09/25/20      PT LONG TERM GOAL #2   Title Pt will report 50% reduction in LBP    Time 6    Period Weeks    Status New    Target Date 09/25/20      PT LONG TERM GOAL #3   Title Pt will demo lumbar AROM WFL </= 1/10 LBP    Time 6    Period Weeks    Status New    Target Date 09/25/20      PT LONG TERM GOAL #4   Title Pt will report able to complete household ADLs and work duties with </= 1/10 LBP    Time 6    Period Weeks    Status New    Target Date 09/25/20                  Plan - 08/21/20 0920    Clinical Impression Statement Pt tolerated an initial progression to TE well. Some increase low back tightness reported with standing row. Tactile cues for posture needed with all standing interventions. Some increase tissues density noted on R lumbar para spinales with palpation. Positive response to single K2C stretches.    Examination-Activity Limitations Bend;Stand;Lift;Locomotion Level    Examination-Participation Restrictions Occupation;Community Activity;Driving;Interpersonal Relationship    Stability/Clinical Decision Making Stable/Uncomplicated    Rehab Potential Good    PT Frequency 2x / week    PT Duration 6 weeks    PT Treatment/Interventions ADLs/Self Care Home Management;Electrical Stimulation;Iontophoresis 4mg /ml Dexamethasone;Moist Heat;Therapeutic exercise;Therapeutic activities;Traction;Patient/family education;Manual techniques;Dry needling    PT Next Visit Plan core/lumbar stab ex's, LE flexibility, manual/modalities as indicated           Patient will benefit from skilled therapeutic intervention in order to improve the following deficits and impairments:  Abnormal gait,Decreased range of motion,Increased muscle spasms,Decreased activity tolerance,Pain,Hypomobility,Impaired flexibility,Decreased strength  Visit Diagnosis: Acute right-sided low back pain with right-sided sciatica  Muscle weakness (generalized)  Cramp and spasm     Problem List Patient Active Problem List   Diagnosis Date Noted  . Chest pain 11/07/2019  . Gastroesophageal reflux disease   . Cervical radiculopathy 10/17/2018  . Preoperative clearance 10/04/2018  . Palpitations   . Coronary artery disease involving coronary bypass graft of native heart with angina pectoris ([place])   . Transient loss of consciousness 03/24/2018  . Ectopic atrial tachycardia ([place]) 02/09/2018  . Central chest pain 03/10/2017  . Family hx-stroke 11/10/2016  .  Stroke-like episode ([place]) - R  brain, s/p tPA 11/09/2016  . Unstable angina ([place]) 09/07/2016  . Claudication of both lower extremities ([place])   . Pure hypercholesterolemia   . Tobacco abuse disorder   . CAD of autologous artery bypass graft without angina   . Chest pain at rest 06/10/2016  . Abnormal nuclear stress test - HIGH RISK 04/20/2016  . Old MI (myocardial infarction)   . Essential hypertension 02/26/2016  . Ischemic cardiomyopathy 12/25/2015  . Hyperlipidemia LDL goal <70 12/25/2015  . 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 Coronary artery disease involving native coronary artery of native heart with angina pectoris ([place]) 11/28/2015  . 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 Acute MI anterior wall first episode care [facility])   . Precordial chest pain 03/07/2015  . Dysphagia 03/07/2015  . Gout attack 03/07/2015  . Mixed bipolar I disorder ([place]) 03/07/2015  . Fibromyalgia 07/09/2014  . Gout 07/09/2014  . Anxiety 07/09/2014  . Depression 07/09/2014  . Hepatitis C 11/20/2012  . Eosinophilic esophagitis 38/46/6599    Sumner Boast 08/21/2020, 11:50 AM  [facility] [place], [place], [address] Phone: [phone]   Fax:  [phone]  Name: [name] MRN: [id number] Date of Birth: [birth year]

## 2020-08-26 ENCOUNTER — Ambulatory Visit: Payer: Medicare Other | Admitting: Physical Therapy

## 2020-08-27 ENCOUNTER — Other Ambulatory Visit: Payer: Self-pay

## 2020-08-27 MED ORDER — METOPROLOL SUCCINATE ER 25 MG PO TB24
25.0000 mg | ORAL_TABLET | Freq: Every day | ORAL | 3 refills | Status: DC
Start: 2020-08-27 — End: 2022-02-15

## 2020-08-28 ENCOUNTER — Ambulatory Visit: Payer: Medicare Other | Admitting: Physical Therapy

## 2020-09-02 ENCOUNTER — Ambulatory Visit: Payer: Medicare Other | Admitting: Physical Therapy

## 2020-09-04 ENCOUNTER — Ambulatory Visit: Payer: Medicare Other | Admitting: Physical Therapy

## 2020-09-09 ENCOUNTER — Ambulatory Visit: Payer: Medicare Other | Attending: Specialist | Admitting: Physical Therapy

## 2020-09-11 ENCOUNTER — Ambulatory Visit: Payer: Medicare Other | Admitting: Physical Therapy

## 2020-09-16 ENCOUNTER — Ambulatory Visit: Payer: Medicare Other | Admitting: Physical Therapy

## 2020-09-19 ENCOUNTER — Ambulatory Visit: Payer: Medicare Other | Admitting: Physical Therapy

## 2020-10-01 ENCOUNTER — Telehealth: Payer: Self-pay | Admitting: Interventional Cardiology

## 2020-10-01 ENCOUNTER — Other Ambulatory Visit: Payer: Self-pay | Admitting: Physician Assistant

## 2020-10-01 NOTE — Telephone Encounter (Signed)
Spoke with the patient who reports he tested positive at Urgent Care last week. Patient has been following up with his PCP. He originally had sinus infection symptoms. He felt like he had turned a corner but this morning woke up with a bad headache. His oxygen saturation has been fluctuating. The lowest it has gotten was 92% but it is currently 99% while on the phone. Current BP 147/99 HR 81 bpm. Patient denies any CP, SOB or N/V, diaphoresis. Patient is requesting Dr. Hassell Done recommendations regarding cardiac precautions.

## 2020-10-01 NOTE — Telephone Encounter (Signed)
I would see if he qualifies for antibody treatment.  Otherwise, no other changes.  JV

## 2020-10-01 NOTE — Telephone Encounter (Signed)
If it has been less than 7 days of symptoms please put in an ambulatory referral to covid treatment. If over 7 days, we cannot do anything for him.

## 2020-10-01 NOTE — Telephone Encounter (Signed)
Called patient and confirmed that it has been over 7 days since symptoms started so he is not eligible for antibody treatment. Patient aware to call back with any new questions or concerns.

## 2020-10-01 NOTE — Telephone Encounter (Signed)
    COVID-19 Pre-Screening Questions V2.:  . In the past 7 to 10 days have you had a cough,  shortness of breath, headache, congestion, fever (100 or greater) body aches, chills, sore throat, or sudden loss of taste or sense of smell,?  Headaches, cough, chills, loss of taste and smell, sore throat  Patient states he tested positive for COVID about 1 week ago and he is requesting to discuss his symptoms with Dr. Hassell Done nurse.  . Have you been around anyone with known Covid 19, or who is waiting for a Covid test result and is symptomatic?    For patients who are Covid+, pending results or exposed in the last 5 days WITH SYMPTOMS:  1.       Offer to change appointment to a [place] appointment. If the patient declines, contact covering nurse or triage so it can be determined if patient needs to be seen or     rescheduled. (assumes patient calls ahead)  2.       If a patient presents in the lobby, ask them to return to their car and the nurse will call them. Obtain the correct cell phone number and message the covering nurse. The nurse will triage the patient and discuss with the provider to determine appropriate visit plan (In office, MyChart or reschedule).   3.       If it is determined the patient needs to be seen in the office, arrangements will be made for the patient to be seen in a remote room with minimal touches. (Location will be         specific to each [facility] site).               If you have any concerns/questions about symptoms patients report during screening (either on the phone or at threshold),                Send a secure chat with the patient's chart to the APP doing preop clearances for that day.              If the decision is made to see the patient, document the following in the appt. note:  "cleared by APP's initials".                        If an APP is not available contact a member of the leadership team.   Patients who are Covid+ are allowed in the  office when the following are met: 1. No symptoms or improving symptoms  2. At least 5 days post positive test

## 2020-10-24 ENCOUNTER — Telehealth: Payer: Self-pay | Admitting: Interventional Cardiology

## 2020-10-24 ENCOUNTER — Encounter (HOSPITAL_BASED_OUTPATIENT_CLINIC_OR_DEPARTMENT_OTHER): Payer: Self-pay | Admitting: Emergency Medicine

## 2020-10-24 ENCOUNTER — Emergency Department (HOSPITAL_BASED_OUTPATIENT_CLINIC_OR_DEPARTMENT_OTHER): Payer: Medicare Other

## 2020-10-24 ENCOUNTER — Other Ambulatory Visit: Payer: Self-pay

## 2020-10-24 ENCOUNTER — Ambulatory Visit: Payer: Self-pay | Admitting: *Deleted

## 2020-10-24 ENCOUNTER — Emergency Department (HOSPITAL_BASED_OUTPATIENT_CLINIC_OR_DEPARTMENT_OTHER)
Admission: EM | Admit: 2020-10-24 | Discharge: 2020-10-24 | Disposition: A | Discharge status: Home / Self Care | Payer: Medicare Other | Attending: Emergency Medicine | Admitting: Emergency Medicine

## 2020-10-24 DIAGNOSIS — Z7982 Long term (current) use of aspirin: Secondary | ICD-10-CM | POA: Insufficient documentation

## 2020-10-24 DIAGNOSIS — Z87891 Personal history of nicotine dependence: Secondary | ICD-10-CM | POA: Diagnosis not present

## 2020-10-24 DIAGNOSIS — M25512 Pain in left shoulder: Secondary | ICD-10-CM | POA: Insufficient documentation

## 2020-10-24 DIAGNOSIS — R918 Other nonspecific abnormal finding of lung field: Secondary | ICD-10-CM | POA: Insufficient documentation

## 2020-10-24 DIAGNOSIS — R0609 Other forms of dyspnea: Secondary | ICD-10-CM | POA: Insufficient documentation

## 2020-10-24 DIAGNOSIS — Z85828 Personal history of other malignant neoplasm of skin: Secondary | ICD-10-CM | POA: Insufficient documentation

## 2020-10-24 DIAGNOSIS — Z8616 Personal history of COVID-19: Secondary | ICD-10-CM | POA: Diagnosis not present

## 2020-10-24 DIAGNOSIS — R059 Cough, unspecified: Secondary | ICD-10-CM | POA: Insufficient documentation

## 2020-10-24 DIAGNOSIS — R5383 Other fatigue: Secondary | ICD-10-CM | POA: Insufficient documentation

## 2020-10-24 DIAGNOSIS — R072 Precordial pain: Secondary | ICD-10-CM | POA: Diagnosis present

## 2020-10-24 DIAGNOSIS — R1013 Epigastric pain: Secondary | ICD-10-CM | POA: Diagnosis not present

## 2020-10-24 DIAGNOSIS — Z7902 Long term (current) use of antithrombotics/antiplatelets: Secondary | ICD-10-CM | POA: Insufficient documentation

## 2020-10-24 DIAGNOSIS — R0602 Shortness of breath: Secondary | ICD-10-CM | POA: Insufficient documentation

## 2020-10-24 DIAGNOSIS — R112 Nausea with vomiting, unspecified: Secondary | ICD-10-CM | POA: Insufficient documentation

## 2020-10-24 DIAGNOSIS — Z951 Presence of aortocoronary bypass graft: Secondary | ICD-10-CM | POA: Insufficient documentation

## 2020-10-24 DIAGNOSIS — I10 Essential (primary) hypertension: Secondary | ICD-10-CM | POA: Insufficient documentation

## 2020-10-24 DIAGNOSIS — Z79899 Other long term (current) drug therapy: Secondary | ICD-10-CM | POA: Diagnosis not present

## 2020-10-24 DIAGNOSIS — I25119 Atherosclerotic heart disease of native coronary artery with unspecified angina pectoris: Secondary | ICD-10-CM | POA: Insufficient documentation

## 2020-10-24 LAB — BASIC METABOLIC PANEL
Anion gap: 10 (ref 5–15)
BUN: 27 mg/dL — ABNORMAL HIGH (ref 8–23)
CO2: 24 mmol/L (ref 22–32)
Calcium: 9.3 mg/dL (ref 8.9–10.3)
Chloride: 103 mmol/L (ref 98–111)
Creatinine, Ser: 1.23 mg/dL (ref 0.61–1.24)
GFR, Estimated: >60 mL/min (ref >60)
Glucose, Bld: 110 mg/dL — ABNORMAL HIGH (ref 70–99)
Potassium: 4.5 mmol/L (ref 3.5–5.1)
Sodium: 137 mmol/L (ref 135–145)

## 2020-10-24 LAB — CBC
HCT: 44.8 % (ref 39.0–52.0)
Hemoglobin: 14.9 g/dL (ref 13.0–17.0)
MCH: 30 pg (ref 26.0–34.0)
MCHC: 33.3 g/dL (ref 30.0–36.0)
MCV: 90.3 fL (ref 80.0–100.0)
Platelets: 261 10*3/uL (ref 150–400)
RBC: 4.96 MIL/uL (ref 4.22–5.81)
RDW: 13.2 % (ref 11.5–15.5)
WBC: 9.2 10*3/uL (ref 4.0–10.5)
nRBC: 0 % (ref 0.0–0.2)

## 2020-10-24 LAB — HEPATIC FUNCTION PANEL
ALT: 29 U/L (ref 0–44)
AST: 26 U/L (ref 15–41)
Albumin: 4.5 g/dL (ref 3.5–5.0)
Alkaline Phosphatase: 57 U/L (ref 38–126)
Bilirubin, Direct: 0.2 mg/dL (ref 0.0–0.2)
Indirect Bilirubin: 0.6 mg/dL (ref 0.3–0.9)
Total Bilirubin: 0.8 mg/dL (ref 0.3–1.2)
Total Protein: 7.6 g/dL (ref 6.5–8.1)

## 2020-10-24 LAB — TROPONIN I (HIGH SENSITIVITY)
Troponin I (High Sensitivity): 3 ng/L (ref <18)
Troponin I (High Sensitivity): 3 ng/L (ref <18)

## 2020-10-24 LAB — LIPASE, BLOOD: Lipase: 29 U/L (ref 11–51)

## 2020-10-24 LAB — D-DIMER, QUANTITATIVE: D-Dimer, Quant: 0.33 ug/mL-FEU (ref 0.00–0.50)

## 2020-10-24 MED ORDER — HYDROMORPHONE HCL 1 MG/ML IJ SOLN
1.0000 mg | Freq: Once | INTRAMUSCULAR | Status: AC
  Administered 2020-10-24: 1 mg via INTRAVENOUS
  Filled 2020-10-24: qty 1

## 2020-10-24 MED ORDER — IOHEXOL 350 MG/ML SOLN
100.0000 mL | Freq: Once | INTRAVENOUS | Status: AC | PRN
  Administered 2020-10-24: 86 mL via INTRAVENOUS

## 2020-10-24 MED ORDER — ONDANSETRON HCL 4 MG/2ML IJ SOLN
4.0000 mg | Freq: Once | INTRAMUSCULAR | Status: AC
  Administered 2020-10-24: 4 mg via INTRAVENOUS

## 2020-10-24 MED ORDER — OXYCODONE-ACETAMINOPHEN 5-325 MG PO TABS: 1.0000 | ORAL_TABLET | ORAL | 0 refills | Status: DC | PRN

## 2020-10-24 MED ORDER — ONDANSETRON HCL 4 MG/2ML IJ SOLN
4.0000 mg | Freq: Once | INTRAMUSCULAR | Status: AC
  Administered 2020-10-24: 4 mg via INTRAVENOUS
  Filled 2020-10-24: qty 2

## 2020-10-24 NOTE — ED Triage Notes (Signed)
Reports central cp that started last night.  Thought it was heartburn.  Took mylanta with some relief.  Describes as aching with some SOB.  Does endorse some sinus congestion.  Had covid in January.  Having continues issues since.

## 2020-10-24 NOTE — ED Notes (Signed)
US at the bedside

## 2020-10-24 NOTE — Telephone Encounter (Signed)
Spoke with pt who is complaining of active CP since last night with nausea vomiting and some radiation. He reports BP and O2 has been stable. Pt states he took Gaviscon with some improvement in symptoms last night.  Pt vomited again at 6am today and CP continues.  He reports he took nitroglycerine a few minutes ago with minimal relief of CP. Pt Covid positive 3 weeks ago.  Pt advised with current symptoms recommending he should have someone take him to ED for further evaluation.  Pt advised he may take another nitroglycerine tablet after 5 minutes up to 3x but again reiterated with current symptoms should go to ED.  Pt verbalizes understanding and agrees with current plan.

## 2020-10-24 NOTE — Discharge Instructions (Signed)
Your work-up today was concerning for a chest pain be related to a newly discovered mass in your right lung.  It is unclear what this tissue is.  As we discussed, it could be changes from your recent Covid infection but also could be a cancerous mass.  I spoke to your PCP, please call them promptly to schedule a follow-up appointment and help schedule outpatient pulmonary work-up with likely biopsy.  Please rest and stay hydrated.  Please use the pain medicine help with the discomfort.  If any symptoms change or worsen acutely, please return to the nearest emergency department.

## 2020-10-24 NOTE — ED Provider Notes (Signed)
[facility] EMERGENCY DEPARTMENT Provider Note   CSN: [id number] Arrival date & time: 10/24/20  3500     History Chief Complaint  Patient presents with  . Chest Pain    [name] is a 65 y.o. male.  The history is provided by the patient and medical records. No language interpreter was used.  Chest Pain Pain location:  Substernal area, L chest and R chest Pain quality: aching, crushing and pressure   Pain radiates to:  L shoulder Pain severity:  No pain Onset quality:  Gradual Timing:  Constant Progression:  Worsening Relieved by:  Nothing Worsened by:  Deep breathing and exertion Ineffective treatments:  None tried Associated symptoms: cough, fatigue, nausea and shortness of breath   Associated symptoms: no abdominal pain, no altered mental status, no back pain, no diaphoresis, no dizziness, no fever, no headache, no lower extremity edema, no palpitations, no vomiting and no weakness   Risk factors: coronary artery disease and male sex        Past Medical History:  Diagnosis Date  . Anxiety   . Arthritis   . Basal cell carcinoma (BCC) of forehead   . CAD (coronary artery disease)    a. 10/2015 ant STEMI >> LHC with 3 v CAD; oLAD tx with POBA >> emergent CABG. b. Multiple evals since that time, early graft failure of SVG-RCA by cath 03/2016. c. 2/19 PCI/DES x1 to pRCA, normal EF.  . Carotid artery disease ([place])    a. 40-59% BICA 02/2018.  Marland Kitchen Depression   . Dyspnea   . Ectopic atrial tachycardia ([place])   . Esophageal reflux    eosinophil esophagitis  . Family history of adverse reaction to anesthesia    "sister has PONV" (06/21/2017)  . Former tobacco use   . Gout   . Hepatitis C    "treated and cured" (06/21/2017)  . High cholesterol   . History of kidney stones   . Hypertension   . Ischemic cardiomyopathy    a. EF 25-30% at intraop TEE 4/17  //  b. Limited Echo 5/17 - EF 45-50%, mild ant HK. c. EF 55-65% by cath 09/2017.  . Migraine     "3-4/yr" (06/21/2017)  . Myocardial infarction ([place]) 10/2015  . Palpitations   . Sinus bradycardia    a. HR dropping into 40s in 02/2016 -> BB reduced.  . Stroke ([place]) 10/2016   "small one; sometimes my memory/cognitive issues" (06/21/2017)  . Symptomatic hypotension    a. 02/2016 ER visit -> meds reduced.  . Syncope     Patient Active Problem List   Diagnosis Date Noted  . Chest pain 11/07/2019  . Gastroesophageal reflux disease   . Cervical radiculopathy 10/17/2018  . Preoperative clearance 10/04/2018  . Palpitations   . Coronary artery disease involving coronary bypass graft of native heart with angina pectoris ([place])   . Transient loss of consciousness 03/24/2018  . Ectopic atrial tachycardia ([place]) 02/09/2018  . Central chest pain 03/10/2017  . Family hx-stroke 11/10/2016  . Stroke-like episode ([place]) - R brain, s/p tPA 11/09/2016  . Unstable angina ([place]) 09/07/2016  . Claudication of both lower extremities ([place])   . Pure hypercholesterolemia   . Tobacco abuse disorder   . CAD of autologous artery bypass graft without angina   . Chest pain at rest 06/10/2016  . Abnormal nuclear stress test - HIGH RISK 04/20/2016  . Old MI (myocardial infarction)   . Essential hypertension 02/26/2016  . Ischemic cardiomyopathy 12/25/2015  .  Hyperlipidemia LDL goal <70 12/25/2015  . 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 Coronary artery disease involving native coronary artery of native heart with angina pectoris ([place]) 11/28/2015  . 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 Acute MI anterior wall first episode care [facility])   . Precordial chest pain 03/07/2015  . Dysphagia 03/07/2015  . Gout attack 03/07/2015  . Mixed bipolar I disorder ([place]) 03/07/2015  . Fibromyalgia 07/09/2014  . Gout 07/09/2014  . Anxiety 07/09/2014  . Depression 07/09/2014  . Hepatitis C 11/20/2012  . Eosinophilic esophagitis 51/76/1607    Past Surgical History:  Procedure Laterality Date  . ANTERIOR CERVICAL  DECOMP/DISCECTOMY FUSION N/A 10/17/2018   Procedure: Anterior Cervical Decompression Fusion - Cervical seven -Thoracic one;  Surgeon: Consuella Lose, MD;  Location: [facility];  Service: Neurosurgery;  Laterality: N/A;  . BASAL CELL CARCINOMA EXCISION     "forehead  . BIOPSY  07/20/2019   Procedure: BIOPSY;  Surgeon: Carol Ada, MD;  Location: [facility];  Service: Endoscopy;;  . CARDIAC CATHETERIZATION N/A 11/28/2015   Procedure: Left Heart Cath and Coronary Angiography;  Surgeon: [name], MD;  Location: [facility];  Service: Cardiovascular;  Laterality: N/A;  . CARDIAC CATHETERIZATION N/A 11/28/2015   Procedure: Coronary Balloon Angioplasty;  Surgeon: [name], MD;  Location: [facility];  Service: Cardiovascular;  Laterality: N/A;  ostial LAD  . CARDIAC CATHETERIZATION N/A 11/28/2015   Procedure: Coronary/Graft Angiography;  Surgeon: [name], MD;  Location: [facility];  Service: Cardiovascular;  Laterality: N/A;  coronaries only   . CARDIAC CATHETERIZATION N/A 04/21/2016   Procedure: Left Heart Cath and Coronary Angiography;  Surgeon: [place] Hampshire, MD;  Location: [facility];  Service: Cardiovascular;  Laterality: N/A;  . CARDIAC CATHETERIZATION N/A 06/14/2016   Procedure: Left Heart Cath and Cors/Grafts Angiography;  Surgeon: Lorretta Harp, MD;  Location: [facility];  Service: Cardiovascular;  Laterality: N/A;  . CARDIAC CATHETERIZATION N/A 09/08/2016   Procedure: Left Heart Cath and Cors/Grafts Angiography;  Surgeon: [place] Hampshire, MD;  Location: [facility];  Service: Cardiovascular;  Laterality: N/A;  . CARDIAC CATHETERIZATION    . CORONARY ARTERY BYPASS GRAFT N/A 11/28/2015   Procedure: CORONARY ARTERY BYPASS GRAFTING (CABG) TIMES FIVE USING LEFT INTERNAL MAMMARY ARTERY AND RIGHT GREATER SAPHENOUS,VIEN HARVEATED BY ENDOVIEN, INTRAOPPRATIVE TEE;  Surgeon: Gaye Pollack, MD;  Location: [facility];  Service:  Open Heart Surgery;  Laterality: N/A;  . CORONARY STENT INTERVENTION N/A 10/05/2017   Procedure: CORONARY STENT INTERVENTION;  Surgeon: [name], MD;  Location: [facility] CV LAB;  Service: Cardiovascular;  Laterality: N/A;  . ESOPHAGOGASTRODUODENOSCOPY (EGD) WITH PROPOFOL N/A 07/20/2019   Procedure: ESOPHAGOGASTRODUODENOSCOPY (EGD) WITH PROPOFOL;  Surgeon: Carol Ada, MD;  [facility];  Service: Endoscopy;  Laterality: N/A;  . HUMERUS SURGERY Right 1969   "tumor inside bone; filled it w/bone chips"  . LEFT HEART CATH AND CORS/GRAFTS ANGIOGRAPHY N/A 03/11/2017   Procedure: Left Heart Cath and Cors/Grafts Angiography;  Surgeon: Leonie Man, MD;  Location: [facility];  Service: Cardiovascular;  Laterality: N/A;  . LEFT HEART CATH AND CORS/GRAFTS ANGIOGRAPHY N/A 10/05/2017   Procedure: LEFT HEART CATH AND CORS/GRAFTS ANGIOGRAPHY;  Surgeon: [name], MD;  Location: [facility] CV LAB;  Service: Cardiovascular;  Laterality: N/A;  . LEFT HEART CATH AND CORS/GRAFTS ANGIOGRAPHY N/A 04/11/2019   Procedure: LEFT HEART CATH AND CORS/GRAFTS ANGIOGRAPHY;  Surgeon: [name], MD;  Location: [facility];  Service: Cardiovascular;  Laterality: N/A;  . PERIPHERAL VASCULAR CATHETERIZATION N/A 06/14/2016   Procedure: Lower Extremity Angiography;  Surgeon: Lorretta Harp, MD;  Location: [facility];  Service: Cardiovascular;  Laterality: N/A;       Family History  Problem Relation Age of Onset  . Lung cancer Mother   . Heart Problems Father   . Heart attack Father 38  . Stroke Father   . Heart failure Father   . Heart attack Maternal Grandmother   . Stroke Maternal Grandmother   . Heart attack Paternal Uncle   . Hypertension Brother   . Autoimmune disease Neg Hx     Social History   Tobacco Use  . Smoking status: Former Smoker    Packs/day: 0.75    Years: 44.00    Pack years: 33.00    Types: Cigarettes    Quit date:  11/28/2015    Years since quitting: 4.9  . Smokeless tobacco: Never Used  Vaping Use  . Vaping Use: Never used  Substance Use Topics  . Alcohol use: Yes    Comment: occ  . Drug use: No    Comment: 06/21/2017 "nothing since the 1980s"    Home Medications Prior to Admission medications   Medication Sig Start Date End Date Taking? Authorizing Provider  acetaminophen (TYLENOL) 500 MG tablet Take 1,000 mg by mouth every 6 (six) hours as needed for mild pain or headache.     [provider]  amLODipine (NORVASC) 10 MG tablet TAKE 1 TABLET(10 MG) BY MOUTH DAILY 03/10/20   [name], MD  aspirin 81 MG tablet Take 1 tablet (81 mg total) by mouth daily. 10/24/18   Costella, Vista Mink, PA-C  atorvastatin (LIPITOR) 80 MG tablet TAKE 1 TABLET(80 MG) BY MOUTH DAILY 12/10/19   [name], MD  cephALEXin (KEFLEX) 500 MG capsule Take 1 capsule (500 mg total) by mouth 4 (four) times daily. 05/15/20   Jacqlyn Larsen, PA-C  chlordiazePOXIDE (LIBRIUM) 10 MG capsule Take 10-20 mg by mouth 3 (three) times daily as needed for anxiety.     [provider]  cloNIDine (CATAPRES) 0.1 MG tablet Take 0.1 mg by mouth at bedtime. 04/09/20   [provider]  clopidogrel (PLAVIX) 75 MG tablet TAKE 1 TABLET BY MOUTH EVERY DAY Patient taking differently: Take 75 mg by mouth every evening.  02/06/20   [name], MD  lisinopril (ZESTRIL) 5 MG tablet TAKE 1 TABLET BY MOUTH DAILY. MAY TAKE ADDITIONAL TABLET ONCE DAILY FOR SYSTOLIC BLOOD PRESSURE GREATER THAN 150 ONE HOUR AFTER TAKING DAILY DOSE 05/13/20   [name], MD  metoprolol succinate (TOPROL-XL) 25 MG 24 hr tablet Take 1 tablet (25 mg total) by mouth daily. 08/27/20   [name], MD  Multiple Vitamins-Minerals (ONE-A-DAY MENS 50+ ADVANTAGE PO) Take 1 tablet by mouth daily.     [provider]  nitroGLYCERIN (NITROSTAT) 0.4 MG SL tablet PLACE 1 TABLET UNDER THE TONGUE EVERY 5 MINUTES AS NEEDED  FOR CHEST PAIN. 3 DOSES MAX 04/16/20   [name], MD  ondansetron (ZOFRAN ODT) 4 MG disintegrating tablet 4mg  ODT q4 hours prn nausea/vomit 05/15/20   Jacqlyn Larsen, PA-C  oxyCODONE-acetaminophen (PERCOCET) 5-325 MG tablet Take 1-2 tablets by mouth every 6 (six) hours as needed. 05/15/20   Jacqlyn Larsen, PA-C  pantoprazole (PROTONIX) 40 MG tablet Take 40 mg by mouth every  evening.     [provider]  ranolazine (RANEXA) 1000 MG SR tablet Take 1 tablet (1,000 mg total) by mouth 2 (two) times daily. Patient taking differently: Take 1,000 mg by mouth daily.  11/08/19   [name], NP  tamsulosin (FLOMAX) 0.4 MG CAPS capsule Take 1 capsule (0.4 mg total) by mouth daily. 05/15/20   Jacqlyn Larsen, PA-C    Allergies    Prednisone, Tetanus toxoids, Wellbutrin [bupropion], Morphine and related, Scallops [shellfish allergy], and Chantix [varenicline]  Review of Systems   Review of Systems  Constitutional: Positive for fatigue. Negative for chills, diaphoresis and fever.  HENT: Negative for congestion.   Eyes: Negative for visual disturbance.  Respiratory: Positive for cough, chest tightness and shortness of breath. Negative for wheezing.   Cardiovascular: Positive for chest pain. Negative for palpitations and leg swelling.  Gastrointestinal: Positive for nausea. Negative for abdominal pain, constipation, diarrhea and vomiting.  Genitourinary: Negative for flank pain and frequency.  Musculoskeletal: Negative for back pain and neck pain.  Skin: Negative for rash and wound.  Neurological: Negative for dizziness, weakness, light-headedness and headaches.  Psychiatric/Behavioral: Negative for agitation and confusion.  All other systems reviewed and are negative.   Physical Exam Updated Vital Signs BP (!) 120/107 (BP Location: Right Arm)   Pulse 80   Temp 98.3 F (36.8 C) (Oral)   Resp 14   Ht 5\' 9"  (1.753 m)   Wt 92 kg   SpO2 99%   BMI 29.95 kg/m   Physical  Exam Vitals and nursing note reviewed.  Constitutional:      General: He is not in acute distress.    Appearance: He is well-developed and well-nourished. He is not ill-appearing, toxic-appearing or diaphoretic.  HENT:     Head: Normocephalic and atraumatic.  Eyes:     Conjunctiva/sclera: Conjunctivae normal.     Pupils: Pupils are equal, round, and reactive to light.  Cardiovascular:     Rate and Rhythm: Normal rate and regular rhythm.     Heart sounds: Normal heart sounds. No murmur heard.   Pulmonary:     Effort: Pulmonary effort is normal. No respiratory distress.     Breath sounds: Normal breath sounds. No decreased breath sounds, wheezing, rhonchi or rales.  Abdominal:     Palpations: Abdomen is soft.     Tenderness: There is no abdominal tenderness.  Musculoskeletal:        General: No edema.     Cervical back: Neck supple.     Right lower leg: No tenderness. No edema.     Left lower leg: No tenderness. No edema.  Skin:    General: Skin is warm and dry.     Capillary Refill: Capillary refill takes less than 2 seconds.  Neurological:     General: No focal deficit present.     Mental Status: He is alert.  Psychiatric:        Mood and Affect: Mood and affect and mood normal.     ED Results / Procedures / Treatments   Labs (all labs ordered are listed, but only abnormal results are displayed) Labs Reviewed  BASIC METABOLIC PANEL - Abnormal; Notable for the following components:      Result Value   Glucose, Bld 110 (*)    BUN 27 (*)    All other components within normal limits  CBC  HEPATIC FUNCTION PANEL  LIPASE, BLOOD  D-DIMER, QUANTITATIVE  TROPONIN I (HIGH SENSITIVITY)  TROPONIN I (HIGH SENSITIVITY)  EKG EKG Interpretation  Date/Time:  Friday October 24 2020 09:38:00 EST Ventricular Rate:  79 PR Interval:    QRS Duration: 91 QT Interval:  376 QTC Calculation: 431 R Axis:   71 Text Interpretation: Sinus rhythm Probable left atrial enlargement  Low voltage, precordial leads When compared to prior, similar apperance. No STEMI Confirmed by Antony Blackbird [phone]) on 10/24/2020 10:03:35 AM   Radiology DG Chest 2 View  Result Date: 10/24/2020 CLINICAL DATA:  Chest pain EXAM: CHEST - 2 VIEW COMPARISON:  04/04/2020 FINDINGS: 17 mm nodular opacity overlying the right upper lobe. Left lung is clear. No pleural effusion or pneumothorax. The heart is normal in size. Postsurgical changes related to prior CABG. Median sternotomy. Cervical spine fixation hardware, incompletely visualized. Degenerative changes of the thoracolumbar spine. IMPRESSION: 17 mm nodular opacity overlying the right upper lobe, new. CT chest with contrast is suggested for further evaluation. Electronically Signed   By: Julian Hy M.D.   On: 10/24/2020 10:54   CT Angio Chest PE W and/or Wo Contrast  Result Date: 10/24/2020 CLINICAL DATA:  Evaluation of pulmonary nodule seen on chest radiograph EXAM: CT ANGIOGRAPHY CHEST WITH CONTRAST TECHNIQUE: Multidetector CT imaging of the chest was performed using the standard protocol during bolus administration of intravenous contrast. Multiplanar CT image reconstructions and MIPs were obtained to evaluate the vascular anatomy. CONTRAST:  29mL OMNIPAQUE IOHEXOL 350 MG/ML SOLN COMPARISON:  Same day chest radiograph and CT a chest November 08, 2019. FINDINGS: Cardiovascular: Satisfactory opacification of the pulmonary arteries to the segmental level. No evidence of pulmonary embolism. Changes of prior CABG. No thoracic aortic aneurysm. Aortic atherosclerosis. Mediastinum/Nodes: No pathologically enlarged mediastinal, hilar or axillary lymph nodes. Visualized portions of the thyroid are unremarkable. Esophagus and trachea are grossly unremarkable. Lungs/Pleura: Solid lobular 1.7 cm right upper lobe pulmonary nodule. No pleural effusion. No pneumothorax. Upper Abdomen: No acute abnormality. Anterior fusion hardware at C7-T1. Prior median sternotomy.  Musculoskeletal: Anterior fusion hardware at C7-T1. Prior median sternotomy. No suspicious lytic or blastic lesion of bone. Review of the MIP images confirms the above findings. IMPRESSION: 1. Solid lobular 1.7 cm right upper lobe pulmonary nodule, suspicious for primary bronchogenic carcinoma. Recommend PET-CT, or tissue sampling for further evaluation. This recommendation follows the consensus statement: Guidelines for Management of Incidental Pulmonary Nodules Detected on CT Images: From the Fleischner Society 2017; Radiology 2017; 284:228-243. 2. No evidence of pulmonary embolus. 3. Aortic atherosclerosis.  Aortic Atherosclerosis (ICD10-I70.0). Electronically Signed   By: Dahlia Bailiff MD   On: 10/24/2020 15:01   US Venous Img Lower Unilateral Left  Result Date: 10/24/2020 CLINICAL DATA:  Left leg pain EXAM: LEFT LOWER EXTREMITY VENOUS DOPPLER ULTRASOUND TECHNIQUE: Gray-scale sonography with graded compression, as well as color Doppler and duplex ultrasound were performed to evaluate the lower extremity deep venous systems from the level of the common femoral vein and including the common femoral, femoral, profunda femoral, popliteal and calf veins including the posterior tibial, peroneal and gastrocnemius veins when visible. The superficial great saphenous vein was also interrogated. Spectral Doppler was utilized to evaluate flow at rest and with distal augmentation maneuvers in the common femoral, femoral and popliteal veins. COMPARISON:  None. FINDINGS: Contralateral Common Femoral Vein: Respiratory phasicity is normal and symmetric with the symptomatic side. No evidence of thrombus. Normal compressibility. Common Femoral Vein: No evidence of thrombus. Normal compressibility, respiratory phasicity and response to augmentation. Saphenofemoral Junction: No evidence of thrombus. Normal compressibility and flow on color Doppler imaging. Profunda Femoral Vein: No  evidence of thrombus. Normal compressibility  and flow on color Doppler imaging. Femoral Vein: No evidence of thrombus. Normal compressibility, respiratory phasicity and response to augmentation. Popliteal Vein: No evidence of thrombus. Normal compressibility, respiratory phasicity and response to augmentation. Calf Veins: No evidence of thrombus. Normal compressibility and flow on color Doppler imaging. Superficial Great Saphenous Vein: No evidence of thrombus. Normal compressibility. Venous Reflux:  None. Other Findings:  None. IMPRESSION: No evidence of left lower extremity deep venous thrombosis. Electronically Signed   By: Davina Poke D.O.   On: 10/24/2020 12:22   US Abdomen Limited RUQ (LIVER/GB)  Result Date: 10/24/2020 CLINICAL DATA:  Epigastric pain. EXAM: ULTRASOUND ABDOMEN LIMITED RIGHT UPPER QUADRANT COMPARISON:  Renal ultrasound 05/20/2020. Abdominopelvic CT 05/27/2020. FINDINGS: Gallbladder: No gallstones or wall thickening visualized. No sonographic Murphy sign noted by sonographer. Common bile duct: Diameter: 3 mm Liver: No focal lesion identified. Within normal limits in parenchymal echogenicity. Portal vein is patent on color Doppler imaging with normal direction of blood flow towards the liver. Other: None. IMPRESSION: Normal right upper quadrant abdominal ultrasound. Electronically Signed   By: Richardean Sale M.D.   On: 10/24/2020 12:36    Procedures Procedures   Medications Ordered in ED Medications  HYDROmorphone (DILAUDID) injection 1 mg (1 mg Intravenous Given 10/24/20 1036)  ondansetron (ZOFRAN) injection 4 mg (4 mg Intravenous Given 10/24/20 1036)  ondansetron (ZOFRAN) injection 4 mg (4 mg Intravenous Given 10/24/20 1358)  iohexol (OMNIPAQUE) 350 MG/ML injection 100 mL (86 mLs Intravenous Contrast Given 10/24/20 1423)  HYDROmorphone (DILAUDID) injection 1 mg (1 mg Intravenous Given 10/24/20 1439)    ED Course  I have reviewed the triage vital signs and the nursing notes.  Pertinent labs & imaging results that  were available during my care of the patient were reviewed by me and considered in my medical decision making (see chart for details).    MDM Rules/Calculators/A&P                          [name] is a 65 y.o. male with a past medical history significant for CAD status post CABG, prior stroke, hypercholesterolemia, hypertension, esophageal reflux, depression, migraines, gout, and COVID last month who presents with new chest pain, upper epigastric pain, nausea/vomiting, discomfort in his left shoulder, exertional shortness of breath, and fatigue.  Patient reports that since having COVID last month, he has been having some persistent fatigue and occasional chills or shortness of breath.  He reports that yesterday, patient started having the pressure in his central chest and his epigastric area that radiated to his left shoulder that felt like his previous MI.  He reports he did not get diaphoretic but did have an episode of nausea and vomiting yesterday.  He reports he is having some discomfort in his upper abdomen and reports he still has his gallbladder.  He denies urinary changes, constipation, or diarrhea.  He reports he is having exertional shortness of breath and his discomfort is also somewhat exertional.  Denies any pleuritic discomfort.  Denies history of DVT or PE.  He does report some burning sensation in his left leg yesterday with no swelling or pain in the calf.  He reports he called his cardiologist, Dr Irish Lack who told him to take nitroglycerin and come to the emergency department for evaluation.   On arrival, EKG shows no STEMI or significant arrhythmia.  On exam, lungs are clear and chest is nontender, I cannot reproduce discomfort.  His epigastric and right upper quadrant are tender to palpation.  Bowel sounds were appreciated.  No flank or back tenderness.  No lower extremity tenderness or edema.   Had a shared decision-making conversation with patient, we will get  screening labs, chest x-ray, and ultrasound of his right upper quadrant given the tenderness present.  Anticipate touching base with his cardiology team if work-up is otherwise reassuring to discuss further management given his pain feeling similar to prior MI.  Anticipate reassessment  Work-up began to return and was overall reassuring thus far.  Troponin negative x2.  X-ray does show an abnormality with a nodule on the right lung.  CT was recommended.  I spoke with cardiology given his history of prior MI and they do not suspect it is cardiac in nature.  They agree with getting a PE study despite the negative D-dimer given the abnormality on chest x-ray.  If PE study is reassuring, they suspect patient will be likely stable for discharge home with cardiology and PCP follow-up.  Anticipate reassessment after CT PE study  3:47 PM CT scan does not show evidence of pulmonary embolism but does show this large, 1.7 cm mass that is likely a primary bronchogenic carcinoma.  Patient has had some improvement in his pain after medications and his vital signs are reassuring with no hypoxia.  At this time, I do not find a reason to admit to the hospital however I am concerned about this new possible cancer.  I called the patient's PCP office and spoke to one of the PAs of Dr. Raynelle Dick.  They agree that the patient needs to be seen as an outpatient to get tissue biopsy and pulmonology evaluation.  They will try to see the patient this week and help schedule this appointment.  Patient will call them shortly to schedule that appointment.  Patient understands extremely strict return precautions and follow-up instructions.  He had no other questions or concerns and will be discharged with prescription for pain medicine as this helped him today for likely pain related to possible cancer.  Patient discharged in good condition with improvement in symptoms.     Final Clinical Impression(s) / ED Diagnoses Final  diagnoses:  Epigastric abdominal pain  Lung mass  Precordial pain    Rx / DC Orders ED Discharge Orders         Ordered    oxyCODONE-acetaminophen (PERCOCET/ROXICET) 5-325 MG tablet  Every 4 hours PRN        10/24/20 1550          Clinical Impression: 1. Lung mass   2. Epigastric abdominal pain   3. Precordial pain     Disposition: Discharge  Condition: Good  I have discussed the results, Dx and Tx plan with the pt(& family if present). He/she/they expressed understanding and agree(s) with the plan. Discharge instructions discussed at great length. Strict return precautions discussed and pt &/or family have verbalized understanding of the instructions. No further questions at time of discharge.    New Prescriptions   OXYCODONE-ACETAMINOPHEN (PERCOCET/ROXICET) 5-325 MG TABLET    Take 1 tablet by mouth every 4 (four) hours as needed.    Follow Up: [name], MD [address] [place] [address] [phone]     [facility] [address] [id number] [facility] [place] [phone]       Adryanna Friedt, Gwenyth Allegra, MD 10/24/20 [phone]

## 2020-10-24 NOTE — Telephone Encounter (Signed)
Pt c/o of Chest Pain: STAT if CP now or developed within 24 hours  1. Are you having CP right now? Yes   2. Are you experiencing any other symptoms (ex. SOB, nausea, vomiting, sweating)? Vomiting, sob, nausea  3. How long have you been experiencing CP? Yesterday   4. Is your CP continuous or coming and going? Coming and going  5. Have you taken Nitroglycerin? No; took other over the counter medications ?

## 2020-10-24 NOTE — Telephone Encounter (Signed)
Patient calling and requesting to review AVS from ED visit today. Reviewed AVS with patient and patient concerned that results of chest CT showed a nodule and his paper work reports a lung mass. Reviewed with patient educational paper work covers general information of a lung mass/ nodule. Patient will need to follow up with PCP to review specific CT results to better understand results. Patient verbalized understanding and to call back or go to ED if symptoms reoccur.   Reason for Disposition . [1] Caller requesting NON-URGENT health information AND [2] PCP's office is the best resource  Answer Assessment - Initial Assessment Questions 1. REASON FOR CALL or QUESTION: "What is your reason for calling today?" or "How can I best help you?" or "What question do you have that I can help answer?"     Patient requesting to review AVS and CT results.  Protocols used: INFORMATION ONLY CALL - NO TRIAGE-A-AH

## 2020-10-24 NOTE — ED Notes (Signed)
Taken to CT at this time. 

## 2020-10-28 ENCOUNTER — Telehealth: Payer: Self-pay | Admitting: Interventional Cardiology

## 2020-10-28 NOTE — Telephone Encounter (Signed)
Will route to Dr. Irish Lack to see if he has a recommendation for Pulmonology.

## 2020-10-28 NOTE — Telephone Encounter (Signed)
New Message:     Pt said he had a CT and a 1.7 centimeter nodule in his lung. This CT results are in my chart if Dr Irish Lack need to review. The pt wants to know who does the [facility] recommends  From our [facility] at [facility] please?

## 2020-10-29 NOTE — Telephone Encounter (Signed)
Patient notified

## 2020-10-29 NOTE — Telephone Encounter (Signed)
Dr. [place] Soho, Dr. Halford Chessman, Dr. Lake Bells, Dr. Chase Caller, Dr. Lamonte Sakai.

## 2020-11-02 NOTE — Progress Notes (Signed)
Cardiology Office Note   Date:  11/03/2020   ID:  [name], [name] 24-Aug-1956, MRN [id number]  PCP:  [name], MD    No chief complaint on file.  CAD  Wt Readings from Last 3 Encounters:  11/03/20 198 lb 6.4 oz (90 kg)  10/24/20 202 lb 13.2 oz (92 kg)  05/27/20 195 lb (88.5 kg)       History of Present Illness: [name] is a 65 y.o. male   with CAD, s/p anterior STEMI and emergent CABG in 2017.   He has had multiple episodes of chest pain since then and multiple cathsin 2018 and a stress test not resulting in PCI. He admits to having had some anxiety as well. Medications have been difficult to tolerate for his anxiety at times.   In 2/19, he had a cath and PCI of the RCA due to narrowing of the graft to the distal RCA.  Seen 02/2018 for presyncope not felt to be cardiac in origin.   Patient was in the ER 06/09/2018 with palpitations and heart rate up to 120 but normal when he arrived. Also had atypical chest pain anxiety was felt to be a component and he requested narcotics for relief. Event monitor 06/13/2018 normal sinus rhythm with occasional sinus bradycardia and sinus tachycardia. No atrial fibrillation or pathologic arrhythmias.  Patient back in the ER 09/28/2018 for atypical chest pain that was reproducible. 2D echo normal LVEF 55 to 60% with no valvular problems. CTA no evidence of aortic dissection, mild prominence of the ascending aorta at 3.9 x 3.9 cm.  In 09/2018,PatienthadC7-T1 anterior cervical fusion done by Dr. Lawrence [facility].  Since the RCA stent, he felt much better.He developed more chest pain in 03/2019 which led to a cath:  Mid LAD lesion is 55% stenosed.  Ost LAD to Prox LAD lesion is 50% stenosed. LIMA to LAD is patent.  Ost Ramus lesion is 90% stenosed.  Ramus lesion is 75% stenosed.  Ost Cx lesion is 80% stenosed.  Prox Cx to Mid Cx lesion is 50% stenosed. SVG to  OM is patent.  Dist RCA lesion is 50% stenosed.  RPDA lesion is 40% stenosed. SVG to PDA is patent with proximal disease that is unchanged.  Previously placed Ost RCA to Prox RCA drug eluting stent is widely patent.  Graft to the diagonal was occluded. There was TIMI 3 flow in the small diagonal with proximal ectasia.  The left ventricular systolic function is normal.  LV end diastolic pressure is normal. LVEDP 11 mm Hg.  The left ventricular ejection fraction is 50-55% by visual estimate.  There is no aortic valve stenosis.  Continue medical therapy.  No new PCI was done.  Since the last visit, he has had some GI issues.  He has had occasional high BP readings at a physical with his MD in 2021.  He had COVID in 09/2020. He was not hospitalized.    Went to hospital on 10/24/20 for chest pain.  Negative troponins.  CT showed: "CT scan does not show evidence of pulmonary embolism but does show this large, 1.7 cm mass that is likely a primary bronchogenic carcinoma. "  He has an appointment with pulmonary on 11/12/20.    Past Medical History:  Diagnosis Date  . Anxiety   . Arthritis   . Basal cell carcinoma (BCC) of forehead   . CAD (coronary artery disease)    a. 10/2015 ant STEMI >> LHC  with 3 v CAD; oLAD tx with POBA >> emergent CABG. b. Multiple evals since that time, early graft failure of SVG-RCA by cath 03/2016. c. 2/19 PCI/DES x1 to pRCA, normal EF.  . Carotid artery disease ([place])    a. 40-59% BICA 02/2018.  Marland Kitchen Depression   . Dyspnea   . Ectopic atrial tachycardia ([place])   . Esophageal reflux    eosinophil esophagitis  . Family history of adverse reaction to anesthesia    "sister has PONV" (06/21/2017)  . Former tobacco use   . Gout   . Hepatitis C    "treated and cured" (06/21/2017)  . High cholesterol   . History of kidney stones   . Hypertension   . Ischemic cardiomyopathy    a. EF 25-30% at intraop TEE 4/17  //  b. Limited Echo 5/17 - EF 45-50%, mild ant  HK. c. EF 55-65% by cath 09/2017.  . Migraine    "3-4/yr" (06/21/2017)  . Myocardial infarction ([place]) 10/2015  . Palpitations   . Sinus bradycardia    a. HR dropping into 40s in 02/2016 -> BB reduced.  . Stroke ([place]) 10/2016   "small one; sometimes my memory/cognitive issues" (06/21/2017)  . Symptomatic hypotension    a. 02/2016 ER visit -> meds reduced.  . Syncope     Past Surgical History:  Procedure Laterality Date  . ANTERIOR CERVICAL DECOMP/DISCECTOMY FUSION N/A 10/17/2018   Procedure: Anterior Cervical Decompression Fusion - Cervical seven -Thoracic one;  Surgeon: Consuella Lose, MD;  Location: [place];  Service: Neurosurgery;  Laterality: N/A;  . BASAL CELL CARCINOMA EXCISION     "forehead  . BIOPSY  07/20/2019   Procedure: BIOPSY;  Surgeon: Carol Ada, MD;  Location: [facility];  Service: Endoscopy;;  . CARDIAC CATHETERIZATION N/A 11/28/2015   Procedure: Left Heart Cath and Coronary Angiography;  Surgeon: [name], MD;  Location: [place] [facility];  Service: Cardiovascular;  Laterality: N/A;  . CARDIAC CATHETERIZATION N/A 11/28/2015   Procedure: Coronary Balloon Angioplasty;  Surgeon: [name], MD;  Location: [place] [facility];  Service: Cardiovascular;  Laterality: N/A;  ostial LAD  . CARDIAC CATHETERIZATION N/A 11/28/2015   Procedure: Coronary/Graft Angiography;  Surgeon: [name], MD;  Location: [place] [facility];  Service: Cardiovascular;  Laterality: N/A;  coronaries only   . CARDIAC CATHETERIZATION N/A 04/21/2016   Procedure: Left Heart Cath and Coronary Angiography;  Surgeon: Wellington Hampshire, MD;  Location: [place] [facility];  Service: Cardiovascular;  Laterality: N/A;  . CARDIAC CATHETERIZATION N/A 06/14/2016   Procedure: Left Heart Cath and Cors/Grafts Angiography;  Surgeon: Lorretta Harp, MD;  Location: [place] [facility];  Service: Cardiovascular;  Laterality: N/A;  . CARDIAC CATHETERIZATION N/A 09/08/2016   Procedure: Left  Heart Cath and Cors/Grafts Angiography;  Surgeon: Wellington Hampshire, MD;  Location: [place] [facility];  Service: Cardiovascular;  Laterality: N/A;  . CARDIAC CATHETERIZATION    . CORONARY ARTERY BYPASS GRAFT N/A 11/28/2015   Procedure: CORONARY ARTERY BYPASS GRAFTING (CABG) TIMES FIVE USING LEFT INTERNAL MAMMARY ARTERY AND RIGHT GREATER SAPHENOUS,VIEN HARVEATED BY ENDOVIEN, INTRAOPPRATIVE TEE;  Surgeon: Gaye Pollack, MD;  Location: [place];  Service: Open Heart Surgery;  Laterality: N/A;  . CORONARY STENT INTERVENTION N/A 10/05/2017   Procedure: CORONARY STENT INTERVENTION;  Surgeon: [name], MD;  Location: [place] [facility];  Service: Cardiovascular;  Laterality: N/A;  . ESOPHAGOGASTRODUODENOSCOPY (EGD) WITH PROPOFOL N/A 07/20/2019   Procedure: ESOPHAGOGASTRODUODENOSCOPY (EGD)  WITH PROPOFOL;  Surgeon: Carol Ada, MD;  [facility];  Service: Endoscopy;  Laterality: N/A;  . HUMERUS SURGERY Right 1969   "tumor inside bone; filled it w/bone chips"  . LEFT HEART CATH AND CORS/GRAFTS ANGIOGRAPHY N/A 03/11/2017   Procedure: Left Heart Cath and Cors/Grafts Angiography;  Surgeon: Leonie Man, MD;  Location: [place] [facility];  Service: Cardiovascular;  Laterality: N/A;  . LEFT HEART CATH AND CORS/GRAFTS ANGIOGRAPHY N/A 10/05/2017   Procedure: LEFT HEART CATH AND CORS/GRAFTS ANGIOGRAPHY;  Surgeon: [name], MD;  Location: [place] [facility];  Service: Cardiovascular;  Laterality: N/A;  . LEFT HEART CATH AND CORS/GRAFTS ANGIOGRAPHY N/A 04/11/2019   Procedure: LEFT HEART CATH AND CORS/GRAFTS ANGIOGRAPHY;  Surgeon: [name], MD;  Location: [place] [facility];  Service: Cardiovascular;  Laterality: N/A;  . PERIPHERAL VASCULAR CATHETERIZATION N/A 06/14/2016   Procedure: Lower Extremity Angiography;  Surgeon: Lorretta Harp, MD;  Location: [place] [facility];  Service: Cardiovascular;  Laterality: N/A;     Current Outpatient Medications  Medication Sig  Dispense Refill  . acetaminophen (TYLENOL) 500 MG tablet Take 1,000 mg by mouth every 6 (six) hours as needed for mild pain or headache.     Marland Kitchen amLODipine (NORVASC) 10 MG tablet TAKE 1 TABLET(10 MG) BY MOUTH DAILY 90 tablet 1  . aspirin 81 MG tablet Take 1 tablet (81 mg total) by mouth daily. 30 tablet   . atorvastatin (LIPITOR) 80 MG tablet TAKE 1 TABLET(80 MG) BY MOUTH DAILY 90 tablet 2  . chlordiazePOXIDE (LIBRIUM) 10 MG capsule Take 10-20 mg by mouth 3 (three) times daily as needed for anxiety.     . cloNIDine (CATAPRES) 0.1 MG tablet Take 0.1 mg by mouth at bedtime.    . clopidogrel (PLAVIX) 75 MG tablet TAKE 1 TABLET BY MOUTH EVERY DAY 90 tablet 1  . lisinopril (ZESTRIL) 5 MG tablet TAKE 1 TABLET BY MOUTH DAILY. MAY TAKE ADDITIONAL TABLET ONCE DAILY FOR SYSTOLIC BLOOD PRESSURE GREATER THAN 150 ONE HOUR AFTER TAKING DAILY DOSE 135 tablet 3  . metoprolol succinate (TOPROL-XL) 25 MG 24 hr tablet Take 1 tablet (25 mg total) by mouth daily. 90 tablet 3  . Multiple Vitamins-Minerals (ONE-A-DAY MENS 50+ ADVANTAGE PO) Take 1 tablet by mouth daily.     . nitroGLYCERIN (NITROSTAT) 0.4 MG SL tablet PLACE 1 TABLET UNDER THE TONGUE EVERY 5 MINUTES AS NEEDED FOR CHEST PAIN. 3 DOSES MAX 25 tablet 4  . pantoprazole (PROTONIX) 40 MG tablet Take 40 mg by mouth every evening.     . ranolazine (RANEXA) 1000 MG SR tablet Take 1 tablet (1,000 mg total) by mouth 2 (two) times daily. 180 tablet 0   No current facility-administered medications for this visit.    Allergies:   Prednisone, Tetanus toxoids, Wellbutrin [bupropion], Morphine and related, Scallops [shellfish allergy], and Chantix [varenicline]    Social History:  The patient  reports that he quit smoking about 4 years ago. His smoking use included cigarettes. He has a 33.00 pack-year smoking history. He has never used smokeless tobacco. He reports current alcohol use. He reports that he does not use drugs.   Family History:  The patient's family history  includes Heart Problems in his father; Heart attack in his maternal grandmother and paternal uncle; Heart attack (age of onset: 18) in his father; Heart failure in his father; Hypertension in his brother; Lung cancer in his mother; Stroke in his father and maternal grandmother.  ROS:  Please see the history of present illness.   Otherwise, review of systems are positive for anxiety with lung mass.   All other systems are reviewed and negative.    PHYSICAL EXAM: VS:  BP 136/86   Pulse 91   Ht 5\' 9"  (1.753 m)   Wt 198 lb 6.4 oz (90 kg)   SpO2 99%   BMI 29.30 kg/m  , BMI Body mass index is 29.3 kg/m. GEN: Well nourished, well developed, in no acute distress  HEENT: normal  Neck: no JVD, carotid bruits, or masses Cardiac: RRR; no murmurs, rubs, or gallops,no edema  Respiratory:  clear to auscultation bilaterally, normal work of breathing GI: soft, nontender, nondistended, + BS MS: no deformity or atrophy  Skin: warm and dry, no rash Neuro:  Strength and sensation are intact Psych: euthymic mood, full affect   EKG:   The ekg ordered2/25/22 demonstrates NSR, no ST segment chnages   Recent Labs: 11/07/2019: B Natriuretic Peptide 26.5 10/24/2020: ALT 29; BUN 27; Creatinine, Ser 1.23; Hemoglobin 14.9; Platelets 261; Potassium 4.5; Sodium 137   Lipid Panel    Component Value Date/Time   CHOL 160 11/08/2019 0432   CHOL 114 11/08/2017 0833   TRIG 153 (H) 11/08/2019 0432   HDL 40 (L) 11/08/2019 0432   HDL 52 11/08/2017 0833   CHOLHDL 4.0 11/08/2019 0432   VLDL 31 11/08/2019 0432   LDLCALC 89 11/08/2019 0432   LDLCALC 43 11/08/2017 0833     Other studies Reviewed: Additional studies/ records that were reviewed today with results demonstrating: Hospital records reviewed.  3   ASSESSMENT AND PLAN:  1. CAD/Old MI: No angina.  COntinue aggressive secondary prevention.  2. Hyperlipidemia: LDL 89. Continue atorvastatin.  3. HTN: The current medical regimen is effective;  continue  present plan and medications.  Home readings are in the 110/60 range.   4. Seeing pulmonary/preoperative cardiovascular eval- Dr. Valeta Harms. CT concerning for lung CA.  Family h/o lung CA.   OK to hold Plavix 5 days prior to biopsy.     Current medicines are reviewed at length with the patient today.  The patient concerns regarding his medicines were addressed.  The following changes have been made:  No change  Labs/ tests ordered today include:  No orders of the defined types were placed in this encounter.   Recommend 150 minutes/week of aerobic exercise Low fat, low carb, high fiber diet recommended  Disposition:   FU in 6 months   Signed, Larae Grooms, MD  11/03/2020 3:13 PM    [facility] [address], [place], [place]  [address] Phone: [phone]; Fax: [phone]

## 2020-11-03 ENCOUNTER — Ambulatory Visit (INDEPENDENT_AMBULATORY_CARE_PROVIDER_SITE_OTHER): Payer: Medicare Other | Admitting: Interventional Cardiology

## 2020-11-03 ENCOUNTER — Other Ambulatory Visit: Payer: Self-pay

## 2020-11-03 ENCOUNTER — Encounter: Payer: Self-pay | Admitting: Interventional Cardiology

## 2020-11-03 VITALS — BP 136/86 | HR 91 | Ht 69.0 in | Wt 198.4 lb

## 2020-11-03 DIAGNOSIS — I252 Old myocardial infarction: Secondary | ICD-10-CM | POA: Diagnosis not present

## 2020-11-03 DIAGNOSIS — I25119 Atherosclerotic heart disease of native coronary artery with unspecified angina pectoris: Secondary | ICD-10-CM

## 2020-11-03 DIAGNOSIS — Z01810 Encounter for preprocedural cardiovascular examination: Secondary | ICD-10-CM

## 2020-11-03 DIAGNOSIS — E785 Hyperlipidemia, unspecified: Secondary | ICD-10-CM | POA: Diagnosis not present

## 2020-11-03 DIAGNOSIS — I10 Essential (primary) hypertension: Secondary | ICD-10-CM | POA: Diagnosis not present

## 2020-11-03 NOTE — Patient Instructions (Signed)
Medication Instructions:  Your physician recommends that you continue on your current medications as directed. Please refer to the Current Medication list given to you today.  *If you need a refill on your cardiac medications before your next appointment, please call your pharmacy*   Lab Work: none If you have labs (blood work) drawn today and your tests are completely normal, you will receive your results only by: Marland Kitchen MyChart Message (if you have MyChart) OR . A paper copy in the mail If you have any lab test that is abnormal or we need to change your treatment, we will call you to review the results.   Testing/Procedures: none   Follow-Up: At [facility], you and your health needs are our priority.  As part of our continuing mission to provide you with exceptional heart care, we have created designated Provider Care Teams.  These Care Teams include your primary Cardiologist (physician) and Advanced Practice Providers (APPs -  Physician Assistants and Nurse Practitioners) who all work together to provide you with the care you need, when you need it.  We recommend signing up for the patient portal called "MyChart".  Sign up information is provided on this After Visit Summary.  MyChart is used to connect with patients for Virtual Visits (Telemedicine).  Patients are able to view lab/test results, encounter notes, upcoming appointments, etc.  Non-urgent messages can be sent to your provider as well.   To learn more about what you can do with MyChart, go to [email or web address].    Your next appointment:   05/13/21 at 9:20  The format for your next appointment:   In Person  Provider:   Casandra Doffing, MD   Other Instructions

## 2020-11-09 ENCOUNTER — Telehealth: Payer: Self-pay | Admitting: Student in an Organized Health Care Education/Training Program

## 2020-11-09 NOTE — Telephone Encounter (Signed)
HR low 100s. Goes down stairs 130-140. Doesn't feel right. Unclear what the underlying issue is. Mild SOB currently. Happened 2 weeks ago in ED and went to the ED where he was found to have 1.7 cm RUL pulmonary nodule. Checked pulse ox and it went down to 90s. Prior smoker, not current. Prior MI with severe chest pain and subsequent CABG, nothing like that currently. Also had nausea with MI.   Similar 2 weeks ago and CTPE negative. Was dx with covid back in 08/2020. Was feeling better following ED evaluation until today. Unclear today as to what is going on. These are all very similar sx to 2 weeks ago.   ROS No fevers, chills, rigors Mild SOB when sitting still, not when doing anything Chest pressure but no pain, off/on since 3-4 PM, 4-5/10 severity, constant, not worse with exertion, localized to chest with some radiation to back, not sure if similar to character of prior MI but doesn't think so, definitely not as severe, says "yes/no."   Patient has no care for his animals at home and wife is out of town. Explained that over the phone there is no way to rule out an emergency such as PE, MI etc and that he should go to the ED if he is concerned about his symptoms and feels they are at all new/evolving. He feels that he will stay at home currently and go to ED if sx worsen.

## 2020-11-12 ENCOUNTER — Telehealth: Payer: Self-pay

## 2020-11-12 ENCOUNTER — Encounter: Payer: Self-pay | Admitting: Pulmonary Disease

## 2020-11-12 ENCOUNTER — Ambulatory Visit (INDEPENDENT_AMBULATORY_CARE_PROVIDER_SITE_OTHER): Payer: Medicare Other | Admitting: Pulmonary Disease

## 2020-11-12 ENCOUNTER — Other Ambulatory Visit: Payer: Self-pay

## 2020-11-12 ENCOUNTER — Telehealth: Payer: Self-pay | Admitting: Interventional Cardiology

## 2020-11-12 VITALS — BP 124/78 | HR 87 | Temp 97.8°F | Ht 68.0 in | Wt 202.5 lb

## 2020-11-12 DIAGNOSIS — R911 Solitary pulmonary nodule: Secondary | ICD-10-CM | POA: Diagnosis not present

## 2020-11-12 DIAGNOSIS — Z87891 Personal history of nicotine dependence: Secondary | ICD-10-CM

## 2020-11-12 NOTE — Telephone Encounter (Signed)
STAT if HR is under 50 or over 120 (normal HR is 60-100 beats per minute)   1) What is your heart rate? 145/91 HR 83 at the beginning of call  HR 117 after walking back down stairs oxygen level at 99   2) Do you have a log of your heart rate readings (document readings)?  Resting ranging in the 90's since last appt. HR ranging in 120's-130's when just going up stairs.   3) Do you have any other symptoms? Not really, but can since when it is high.    Woke up twice last night drenched in sweat. States it may be due to being out of shape since having Covid 2 months ago and still trying to get over all the symptoms, wasn't sure if medication changes are needed. [name] has a Doctor's appt today and is requesting a callback after 1 pm today. Please advise.

## 2020-11-12 NOTE — Telephone Encounter (Signed)
RN returned call to patient to discuss symptoms he was experiencing. Patient states he has been experiencing an increased HR while resting on occasion since he has had COVID over the last 2 months. Patient states even lying in bed he has had occurences of a HR of around 100bpm while lying down. Patient states it appears that the heart rates have continued to increase over the last week. Patient did relay that he had a follow-up appointment with pulmonologist regarding a concerning nodule found on his lung, that the specialist are "pretty certain" is cancerous. Patient is awaiting further testing at this time, but does state he may have experienced mild anxiety related to the situation. Patient asked if a message could be sent to Dr. Irish Lack to see what his thoughts were regarding the changes. RN advised that I would send MD a message to receive advice. Patient appreciative of phone call and thanked RN for calling.    Call routed to Dr. Irish Lack and Zenaida Deed, [place].

## 2020-11-12 NOTE — Telephone Encounter (Signed)
Return call to patient to update patient regarding MD recommendations. Patient in agreement. RN instructed patient to call with questions or if symptoms worsen.

## 2020-11-12 NOTE — Progress Notes (Signed)
Synopsis: Referred in March 2022 for upper lobe pulmonary nodule by [name], MD  Subjective:   PATIENT ID: [name] GENDER: male DOB: Oct 08, 1955, MRN: [id number]  Chief Complaint  Patient presents with  . Consult    Hx of long hauler covid---had CT scan done in the ER to eval for blood clots and found lung nodules.      This is a 65 year old gentleman, history of STEMI 2017, carotid artery disease, prior ischemic cardiomyopathy with improved ejection fraction.  Patient currently on Plavix.  Recently seen by cardiology.  Patient had a CT scan of the chest completed during an emergency department visit on 10/24/2020.  This revealed a new 1.7 cm right upper lobe pulmonary nodule concerning for primary bronchogenic carcinoma.  Patient was referred for evaluation of this abnormality.  From respiratory standpoint he still feels short of breath with significant exertion but he is slowly getting better.  Was diagnosed with COVID in January of this past year.  He went to the ER with chest pains and shortness of breath.  He had a VQ scan which was negative for PE.  CT scan of the chest revealed this new upper lobe lung nodule.  We also compared this to his previous CT imaging in March 2021.  Nodule was not present at this time.   Past Medical History:  Diagnosis Date  . Anxiety   . Arthritis   . Basal cell carcinoma (BCC) of forehead   . CAD (coronary artery disease)    a. 10/2015 ant STEMI >> LHC with 3 v CAD; oLAD tx with POBA >> emergent CABG. b. Multiple evals since that time, early graft failure of SVG-RCA by cath 03/2016. c. 2/19 PCI/DES x1 to pRCA, normal EF.  . Carotid artery disease ([place])    a. 40-59% BICA 02/2018.  Marland Kitchen Depression   . Dyspnea   . Ectopic atrial tachycardia ([place])   . Esophageal reflux    eosinophil esophagitis  . Family history of adverse reaction to anesthesia    "sister has PONV" (06/21/2017)  . Former tobacco use   . Gout   . Hepatitis C    "treated  and cured" (06/21/2017)  . High cholesterol   . History of kidney stones   . Hypertension   . Ischemic cardiomyopathy    a. EF 25-30% at intraop TEE 4/17  //  b. Limited Echo 5/17 - EF 45-50%, mild ant HK. c. EF 55-65% by cath 09/2017.  . Migraine    "3-4/yr" (06/21/2017)  . Myocardial infarction ([place]) 10/2015  . Palpitations   . Sinus bradycardia    a. HR dropping into 40s in 02/2016 -> BB reduced.  . Stroke ([place]) 10/2016   "small one; sometimes my memory/cognitive issues" (06/21/2017)  . Symptomatic hypotension    a. 02/2016 ER visit -> meds reduced.  . Syncope      Family History  Problem Relation Age of Onset  . Lung cancer Mother   . Heart Problems Father   . Heart attack Father 52  . Stroke Father   . Heart failure Father   . Heart attack Maternal Grandmother   . Stroke Maternal Grandmother   . Heart attack Paternal Uncle   . Hypertension Brother   . Autoimmune disease Neg Hx      Past Surgical History:  Procedure Laterality Date  . ANTERIOR CERVICAL DECOMP/DISCECTOMY FUSION N/A 10/17/2018   Procedure: Anterior Cervical Decompression Fusion - Cervical seven -Thoracic one;  Surgeon: Consuella Lose,  MD;  Location: [facility];  Service: Neurosurgery;  Laterality: N/A;  . BASAL CELL CARCINOMA EXCISION     "forehead  . BIOPSY  07/20/2019   Procedure: BIOPSY;  Surgeon: Carol Ada, MD;  Location: [facility];  Service: Endoscopy;;  . CARDIAC CATHETERIZATION N/A 11/28/2015   Procedure: Left Heart Cath and Coronary Angiography;  Surgeon: [name], MD;  Location: [facility];  Service: Cardiovascular;  Laterality: N/A;  . CARDIAC CATHETERIZATION N/A 11/28/2015   Procedure: Coronary Balloon Angioplasty;  Surgeon: [name], MD;  Location: [facility];  Service: Cardiovascular;  Laterality: N/A;  ostial LAD  . CARDIAC CATHETERIZATION N/A 11/28/2015   Procedure: Coronary/Graft Angiography;  Surgeon: [name], MD;  Location: [facility];  Service: Cardiovascular;  Laterality: N/A;  coronaries only   . CARDIAC CATHETERIZATION N/A 04/21/2016   Procedure: Left Heart Cath and Coronary Angiography;  Surgeon: Wellington Hampshire, MD;  Location: [facility];  Service: Cardiovascular;  Laterality: N/A;  . CARDIAC CATHETERIZATION N/A 06/14/2016   Procedure: Left Heart Cath and Cors/Grafts Angiography;  Surgeon: Lorretta Harp, MD;  Location: [facility];  Service: Cardiovascular;  Laterality: N/A;  . CARDIAC CATHETERIZATION N/A 09/08/2016   Procedure: Left Heart Cath and Cors/Grafts Angiography;  Surgeon: Wellington Hampshire, MD;  Location: [facility];  Service: Cardiovascular;  Laterality: N/A;  . CARDIAC CATHETERIZATION    . CORONARY ARTERY BYPASS GRAFT N/A 11/28/2015   Procedure: CORONARY ARTERY BYPASS GRAFTING (CABG) TIMES FIVE USING LEFT INTERNAL MAMMARY ARTERY AND RIGHT GREATER SAPHENOUS,VIEN HARVEATED BY ENDOVIEN, INTRAOPPRATIVE TEE;  Surgeon: Gaye Pollack, MD;  Location: [facility];  Service: Open Heart Surgery;  Laterality: N/A;  . CORONARY STENT INTERVENTION N/A 10/05/2017   Procedure: CORONARY STENT INTERVENTION;  Surgeon: [name], MD;  Location: [facility];  Service: Cardiovascular;  Laterality: N/A;  . ESOPHAGOGASTRODUODENOSCOPY (EGD) WITH PROPOFOL N/A 07/20/2019   Procedure: ESOPHAGOGASTRODUODENOSCOPY (EGD) WITH PROPOFOL;  Surgeon: Carol Ada, MD;  [facility];  Service: Endoscopy;  Laterality: N/A;  . HUMERUS SURGERY Right 1969   "tumor inside bone; filled it w/bone chips"  . LEFT HEART CATH AND CORS/GRAFTS ANGIOGRAPHY N/A 03/11/2017   Procedure: Left Heart Cath and Cors/Grafts Angiography;  Surgeon: Leonie Man, MD;  Location: [facility];  Service: Cardiovascular;  Laterality: N/A;  . LEFT HEART CATH AND CORS/GRAFTS ANGIOGRAPHY N/A 10/05/2017   Procedure: LEFT HEART CATH AND CORS/GRAFTS ANGIOGRAPHY;  Surgeon: [name], MD;  Location: [facility];  Service: Cardiovascular;  Laterality: N/A;  . LEFT HEART CATH AND CORS/GRAFTS ANGIOGRAPHY N/A 04/11/2019   Procedure: LEFT HEART CATH AND CORS/GRAFTS ANGIOGRAPHY;  Surgeon: [name], MD;  Location: [facility];  Service: Cardiovascular;  Laterality: N/A;  . PERIPHERAL VASCULAR CATHETERIZATION N/A 06/14/2016   Procedure: Lower Extremity Angiography;  Surgeon: Lorretta Harp, MD;  Location: [facility];  Service: Cardiovascular;  Laterality: N/A;    Social History   Socioeconomic History  . Marital status: Married    Spouse name: [name]  . Number of children: 3  . Years of education: College  . Highest education level: Not on file  Occupational History  . Occupation: Scientist, research (physical sciences): SELF-EMPLOYED  Tobacco Use  . Smoking status: Former Smoker    Packs/day: 0.75    Years: 44.00    Pack years: 33.00    Types: Cigarettes  Quit date: 11/28/2015    Years since quitting: 4.9  . Smokeless tobacco: Never Used  Vaping Use  . Vaping Use: Never used  Substance and Sexual Activity  . Alcohol use: Yes    Comment: occ  . Drug use: No    Comment: 06/21/2017 "nothing since the 1980s"  . Sexual activity: Yes  Other Topics Concern  . Not on file  Social History Narrative   Patient lives at home with his spouse.   Caffeine Use: yes   Social Determinants of Health   Financial Resource Strain: Not on file  Food Insecurity: Not on file  Transportation Needs: Not on file  Physical Activity: Not on file  Stress: Not on file  Social Connections: Not on file  Intimate Partner Violence: Not on file     Allergies  Allergen Reactions  . Prednisone Other (See Comments)    States that this med makes him "crazy"  . Tetanus Toxoids Swelling and Other (See Comments)    Fever, Swelling of the arm   . Wellbutrin [Bupropion] Other (See Comments)    Crazy thoughts, nightmares  . Morphine And Related Hives and Itching    Redness at the injection site  .  Scallops [Shellfish Allergy] Nausea Only  . Chantix [Varenicline] Other (See Comments)    Dreams     Outpatient Medications Prior to Visit  Medication Sig Dispense Refill  . acetaminophen (TYLENOL) 500 MG tablet Take 1,000 mg by mouth every 6 (six) hours as needed for mild pain or headache.     Marland Kitchen amLODipine (NORVASC) 10 MG tablet TAKE 1 TABLET(10 MG) BY MOUTH DAILY 90 tablet 1  . aspirin 81 MG tablet Take 1 tablet (81 mg total) by mouth daily. 30 tablet   . atorvastatin (LIPITOR) 80 MG tablet TAKE 1 TABLET(80 MG) BY MOUTH DAILY 90 tablet 2  . chlordiazePOXIDE (LIBRIUM) 10 MG capsule Take 10-20 mg by mouth 3 (three) times daily as needed for anxiety.     . cloNIDine (CATAPRES) 0.1 MG tablet Take 0.1 mg by mouth at bedtime.    . clopidogrel (PLAVIX) 75 MG tablet TAKE 1 TABLET BY MOUTH EVERY DAY 90 tablet 1  . lisinopril (ZESTRIL) 5 MG tablet TAKE 1 TABLET BY MOUTH DAILY. MAY TAKE ADDITIONAL TABLET ONCE DAILY FOR SYSTOLIC BLOOD PRESSURE GREATER THAN 150 ONE HOUR AFTER TAKING DAILY DOSE 135 tablet 3  . metoprolol succinate (TOPROL-XL) 25 MG 24 hr tablet Take 1 tablet (25 mg total) by mouth daily. 90 tablet 3  . Multiple Vitamins-Minerals (ONE-A-DAY MENS 50+ ADVANTAGE PO) Take 1 tablet by mouth daily.     . nitroGLYCERIN (NITROSTAT) 0.4 MG SL tablet PLACE 1 TABLET UNDER THE TONGUE EVERY 5 MINUTES AS NEEDED FOR CHEST PAIN. 3 DOSES MAX 25 tablet 4  . pantoprazole (PROTONIX) 40 MG tablet Take 40 mg by mouth every evening.     . ranolazine (RANEXA) 1000 MG SR tablet Take 1 tablet (1,000 mg total) by mouth 2 (two) times daily. 180 tablet 0   No facility-administered medications prior to visit.    Review of Systems  Constitutional: Negative for chills, fever, malaise/fatigue and weight loss.  HENT: Negative for hearing loss, sore throat and tinnitus.   Eyes: Negative for blurred vision and double vision.  Respiratory: Positive for shortness of breath. Negative for cough, hemoptysis, sputum  production, wheezing and stridor.   Cardiovascular: Negative for chest pain, palpitations, orthopnea, leg swelling and PND.  Gastrointestinal: Negative for abdominal pain, constipation, diarrhea, heartburn,  nausea and vomiting.  Genitourinary: Negative for dysuria, hematuria and urgency.  Musculoskeletal: Negative for joint pain and myalgias.  Skin: Negative for itching and rash.  Neurological: Negative for dizziness, tingling, weakness and headaches.  Endo/Heme/Allergies: Negative for environmental allergies. Does not bruise/bleed easily.  Psychiatric/Behavioral: Negative for depression. The patient is not nervous/anxious and does not have insomnia.   All other systems reviewed and are negative.    Objective:  Physical Exam Vitals reviewed.  Constitutional:      General: He is not in acute distress.    Appearance: He is well-developed.  HENT:     Head: Normocephalic and atraumatic.  Eyes:     General: No scleral icterus.    Conjunctiva/sclera: Conjunctivae normal.     Pupils: Pupils are equal, round, and reactive to light.  Neck:     Vascular: No JVD.     Trachea: No tracheal deviation.  Cardiovascular:     Rate and Rhythm: Normal rate and regular rhythm.     Heart sounds: Normal heart sounds. No murmur heard.   Pulmonary:     Effort: Pulmonary effort is normal. No tachypnea, accessory muscle usage or respiratory distress.     Breath sounds: No stridor. No wheezing, rhonchi or rales.  Abdominal:     General: Bowel sounds are normal. There is no distension.     Palpations: Abdomen is soft.     Tenderness: There is no abdominal tenderness.  Musculoskeletal:        General: No tenderness.     Cervical back: Neck supple.  Lymphadenopathy:     Cervical: No cervical adenopathy.  Skin:    General: Skin is warm and dry.     Capillary Refill: Capillary refill takes less than 2 seconds.     Findings: No rash.  Neurological:     Mental Status: He is alert and oriented to  person, place, and time.  Psychiatric:        Behavior: Behavior normal.      Vitals:   11/12/20 1126  BP: 124/78  Pulse: 87  Temp: 97.8 F (36.6 C)  TempSrc: Tympanic  SpO2: 98%  Weight: 202 lb 8 oz (91.9 kg)  Height: 5\' 8"  (1.727 m)   98% on RA BMI Readings from Last 3 Encounters:  11/12/20 30.79 kg/m  11/03/20 29.30 kg/m  10/24/20 29.95 kg/m   Wt Readings from Last 3 Encounters:  11/12/20 202 lb 8 oz (91.9 kg)  11/03/20 198 lb 6.4 oz (90 kg)  10/24/20 202 lb 13.2 oz (92 kg)     CBC    Component Value Date/Time   WBC 9.2 10/24/2020 0952   RBC 4.96 10/24/2020 0952   HGB 14.9 10/24/2020 0952   HCT 44.8 10/24/2020 0952   PLT 261 10/24/2020 0952   MCV 90.3 10/24/2020 0952   MCH 30.0 10/24/2020 0952   MCHC 33.3 10/24/2020 0952   RDW 13.2 10/24/2020 0952   LYMPHSABS 1.5 05/27/2020 1039   MONOABS 0.6 05/27/2020 1039   EOSABS 0.0 05/27/2020 1039   BASOSABS 0.0 05/27/2020 1039    Chest Imaging: CT scan of the chest 10/24/2020: CTA revealed a new 1.7 cm right upper lobe pulmonary nodule concerning for a primary bronchogenic carcinoma. No other thoracic adenopathy noted. The patient's images have been independently reviewed by me.    Pulmonary Functions Testing Results: No flowsheet data found.  FeNO:   Pathology:   Echocardiogram:   Heart Catheterization:     Assessment & Plan:  ICD-10-CM   1. Solitary lung nodule  R91.1 NM PET Image Initial (PI) Skull Base To Thigh    Ambulatory referral to Cardiothoracic Surgery    Pulmonary Function Test  2. Former smoker  Z87.891 NM PET Image Initial (PI) Skull Base To Thigh    Ambulatory referral to Cardiothoracic Surgery    Pulmonary Function Test  3. Right upper lobe pulmonary nodule  R91.1     Discussion:  This is a 65 year old gentleman former smoker, right upper lobe solitary pulmonary nodule 1.7 cm concerning for a primary bronchogenic carcinoma.  Patient quit smoking in 2017.  Has a good  functional status.  Ejection fraction recovered on previous echocardiogram.  Had a CABG x4 by Dr. Caffie Pinto in 2017.  Plan: Referral placed for PET scan Pending PET scan and pulmonary function test could consider resection by thoracic surgery. Referral placed to thoracic surgery to go ahead and coordinate appointments. If decision made for need of tissue diagnosis we are happy to perform navigational bronchoscopy.  I discussed different options with the patient today in the office to include navigational bronchoscopy, tissue sampling, fiducial placement and referral to radiation oncology for SBRT versus a potential need for tissue diagnosis and resection by thoracic surgery.  Patient would like to proceed with resection if at all possible.  I explained that we would need to have the PET scan complete and PFTs as well as consultation with thoracic surgery before making this decision.  Orders have been placed.    Current Outpatient Medications:  .  acetaminophen (TYLENOL) 500 MG tablet, Take 1,000 mg by mouth every 6 (six) hours as needed for mild pain or headache. , Disp: , Rfl:  .  amLODipine (NORVASC) 10 MG tablet, TAKE 1 TABLET(10 MG) BY MOUTH DAILY, Disp: 90 tablet, Rfl: 1 .  aspirin 81 MG tablet, Take 1 tablet (81 mg total) by mouth daily., Disp: 30 tablet, Rfl:  .  atorvastatin (LIPITOR) 80 MG tablet, TAKE 1 TABLET(80 MG) BY MOUTH DAILY, Disp: 90 tablet, Rfl: 2 .  chlordiazePOXIDE (LIBRIUM) 10 MG capsule, Take 10-20 mg by mouth 3 (three) times daily as needed for anxiety. , Disp: , Rfl:  .  cloNIDine (CATAPRES) 0.1 MG tablet, Take 0.1 mg by mouth at bedtime., Disp: , Rfl:  .  clopidogrel (PLAVIX) 75 MG tablet, TAKE 1 TABLET BY MOUTH EVERY DAY, Disp: 90 tablet, Rfl: 1 .  lisinopril (ZESTRIL) 5 MG tablet, TAKE 1 TABLET BY MOUTH DAILY. MAY TAKE ADDITIONAL TABLET ONCE DAILY FOR SYSTOLIC BLOOD PRESSURE GREATER THAN 150 ONE HOUR AFTER TAKING DAILY DOSE, Disp: 135 tablet, Rfl: 3 .  metoprolol  succinate (TOPROL-XL) 25 MG 24 hr tablet, Take 1 tablet (25 mg total) by mouth daily., Disp: 90 tablet, Rfl: 3 .  Multiple Vitamins-Minerals (ONE-A-DAY MENS 50+ ADVANTAGE PO), Take 1 tablet by mouth daily. , Disp: , Rfl:  .  nitroGLYCERIN (NITROSTAT) 0.4 MG SL tablet, PLACE 1 TABLET UNDER THE TONGUE EVERY 5 MINUTES AS NEEDED FOR CHEST PAIN. 3 DOSES MAX, Disp: 25 tablet, Rfl: 4 .  pantoprazole (PROTONIX) 40 MG tablet, Take 40 mg by mouth every evening. , Disp: , Rfl:  .  ranolazine (RANEXA) 1000 MG SR tablet, Take 1 tablet (1,000 mg total) by mouth 2 (two) times daily., Disp: 180 tablet, Rfl: 0  I spent 62 minutes dedicated to the care of this patient on the date of this encounter to include pre-visit review of records, face-to-face time with the patient discussing conditions above, post visit  ordering of testing, clinical documentation with the electronic health record, making appropriate referrals as documented, and communicating necessary findings to members of the patients care team.   Garner Nash, DO [facility] 11/12/2020 11:51 AM

## 2020-11-12 NOTE — Patient Instructions (Signed)
Thank you for visiting Dr. Valeta Harms at [facility]. Today we recommend the following:  Orders Placed This Encounter  Procedures  . NM PET Image Initial (PI) Skull Base To Thigh  . Ambulatory referral to Cardiothoracic Surgery  . Pulmonary Function Test   Return in about 2 months (around 01/12/2021).    Please do your part to reduce the spread of COVID-19.

## 2020-11-12 NOTE — Telephone Encounter (Signed)
I agree that the higher HR was likely from anxiety.  He is on a low dose of Toprol.  I would give him the flexiblity to take an extra Toprol daily as needed if he feels a lot of high HR.   JV

## 2020-11-12 NOTE — Telephone Encounter (Signed)
See alternate phone note from 3/16

## 2020-11-13 ENCOUNTER — Ambulatory Visit (INDEPENDENT_AMBULATORY_CARE_PROVIDER_SITE_OTHER): Payer: Medicare Other | Admitting: Pulmonary Disease

## 2020-11-13 DIAGNOSIS — R911 Solitary pulmonary nodule: Secondary | ICD-10-CM

## 2020-11-13 DIAGNOSIS — Z87891 Personal history of nicotine dependence: Secondary | ICD-10-CM

## 2020-11-13 LAB — PULMONARY FUNCTION TEST
DL/VA % pred: 101 %
DL/VA: 4.2 ml/min/mmHg/L
DLCO cor % pred: 89 %
DLCO cor: 23.2 ml/min/mmHg
DLCO unc % pred: 90 %
DLCO unc: 23.39 ml/min/mmHg
FEF 25-75 Post: 1.63 L/sec
FEF 25-75 Pre: 0.97 L/sec
FEF2575-%Change-Post: 68 %
FEF2575-%Pred-Post: 62 %
FEF2575-%Pred-Pre: 36 %
FEV1-%Change-Post: 18 %
FEV1-%Pred-Post: 67 %
FEV1-%Pred-Pre: 57 %
FEV1-Post: 2.25 L
FEV1-Pre: 1.89 L
FEV1FVC-%Change-Post: 6 %
FEV1FVC-%Pred-Pre: 82 %
FEV6-%Change-Post: 13 %
FEV6-%Pred-Post: 81 %
FEV6-%Pred-Pre: 71 %
FEV6-Post: 3.41 L
FEV6-Pre: 2.99 L
FEV6FVC-%Change-Post: 1 %
FEV6FVC-%Pred-Post: 104 %
FEV6FVC-%Pred-Pre: 102 %
FVC-%Change-Post: 12 %
FVC-%Pred-Post: 77 %
FVC-%Pred-Pre: 69 %
FVC-Post: 3.45 L
FVC-Pre: 3.08 L
Post FEV1/FVC ratio: 65 %
Post FEV6/FVC ratio: 99 %
Pre FEV1/FVC ratio: 61 %
Pre FEV6/FVC Ratio: 97 %
RV % pred: 147 %
RV: 3.38 L
TLC % pred: 94 %
TLC: 6.43 L

## 2020-11-13 NOTE — Progress Notes (Signed)
PFT done today. 

## 2020-11-18 ENCOUNTER — Encounter (HOSPITAL_BASED_OUTPATIENT_CLINIC_OR_DEPARTMENT_OTHER): Payer: Self-pay | Admitting: Radiology

## 2020-11-18 ENCOUNTER — Emergency Department (HOSPITAL_BASED_OUTPATIENT_CLINIC_OR_DEPARTMENT_OTHER): Payer: Medicare Other

## 2020-11-18 ENCOUNTER — Emergency Department (HOSPITAL_BASED_OUTPATIENT_CLINIC_OR_DEPARTMENT_OTHER)
Admission: EM | Admit: 2020-11-18 | Discharge: 2020-11-18 | Disposition: A | Discharge status: Home / Self Care | Payer: Medicare Other | Attending: Emergency Medicine | Admitting: Emergency Medicine

## 2020-11-18 ENCOUNTER — Other Ambulatory Visit: Payer: Self-pay

## 2020-11-18 DIAGNOSIS — Z8616 Personal history of COVID-19: Secondary | ICD-10-CM | POA: Insufficient documentation

## 2020-11-18 DIAGNOSIS — R1013 Epigastric pain: Secondary | ICD-10-CM | POA: Diagnosis not present

## 2020-11-18 DIAGNOSIS — Z87891 Personal history of nicotine dependence: Secondary | ICD-10-CM | POA: Insufficient documentation

## 2020-11-18 DIAGNOSIS — R110 Nausea: Secondary | ICD-10-CM | POA: Diagnosis not present

## 2020-11-18 DIAGNOSIS — Z7982 Long term (current) use of aspirin: Secondary | ICD-10-CM | POA: Insufficient documentation

## 2020-11-18 DIAGNOSIS — Z85828 Personal history of other malignant neoplasm of skin: Secondary | ICD-10-CM | POA: Diagnosis not present

## 2020-11-18 DIAGNOSIS — I10 Essential (primary) hypertension: Secondary | ICD-10-CM | POA: Insufficient documentation

## 2020-11-18 DIAGNOSIS — Z951 Presence of aortocoronary bypass graft: Secondary | ICD-10-CM | POA: Insufficient documentation

## 2020-11-18 DIAGNOSIS — Z79899 Other long term (current) drug therapy: Secondary | ICD-10-CM | POA: Diagnosis not present

## 2020-11-18 DIAGNOSIS — R079 Chest pain, unspecified: Secondary | ICD-10-CM | POA: Insufficient documentation

## 2020-11-18 DIAGNOSIS — I25118 Atherosclerotic heart disease of native coronary artery with other forms of angina pectoris: Secondary | ICD-10-CM | POA: Diagnosis not present

## 2020-11-18 LAB — CBC WITH DIFFERENTIAL/PLATELET
Abs Immature Granulocytes: 0.04 10*3/uL (ref 0.00–0.07)
Basophils Absolute: 0.1 10*3/uL (ref 0.0–0.1)
Basophils Relative: 1 %
Eosinophils Absolute: 0.3 10*3/uL (ref 0.0–0.5)
Eosinophils Relative: 4 %
HCT: 37.3 % — ABNORMAL LOW (ref 39.0–52.0)
Hemoglobin: 12.4 g/dL — ABNORMAL LOW (ref 13.0–17.0)
Immature Granulocytes: 1 %
Lymphocytes Relative: 30 %
Lymphs Abs: 2.4 10*3/uL (ref 0.7–4.0)
MCH: 30.1 pg (ref 26.0–34.0)
MCHC: 33.2 g/dL (ref 30.0–36.0)
MCV: 90.5 fL (ref 80.0–100.0)
Monocytes Absolute: 0.7 10*3/uL (ref 0.1–1.0)
Monocytes Relative: 8 %
Neutro Abs: 4.5 10*3/uL (ref 1.7–7.7)
Neutrophils Relative %: 56 %
Platelets: 205 10*3/uL (ref 150–400)
RBC: 4.12 MIL/uL — ABNORMAL LOW (ref 4.22–5.81)
RDW: 13.2 % (ref 11.5–15.5)
WBC: 7.9 10*3/uL (ref 4.0–10.5)
nRBC: 0 % (ref 0.0–0.2)

## 2020-11-18 LAB — URINALYSIS, ROUTINE W REFLEX MICROSCOPIC
Bilirubin Urine: NEGATIVE
Glucose, UA: NEGATIVE mg/dL
Hgb urine dipstick: NEGATIVE
Ketones, ur: NEGATIVE mg/dL
Leukocytes,Ua: NEGATIVE
Nitrite: NEGATIVE
Protein, ur: NEGATIVE mg/dL
Specific Gravity, Urine: 1.015 (ref 1.005–1.030)
pH: 7 (ref 5.0–8.0)

## 2020-11-18 LAB — BASIC METABOLIC PANEL
Anion gap: 8 (ref 5–15)
BUN: 17 mg/dL (ref 8–23)
CO2: 19 mmol/L — ABNORMAL LOW (ref 22–32)
Calcium: 7.4 mg/dL — ABNORMAL LOW (ref 8.9–10.3)
Chloride: 113 mmol/L — ABNORMAL HIGH (ref 98–111)
Creatinine, Ser: 0.71 mg/dL (ref 0.61–1.24)
GFR, Estimated: >60 mL/min (ref >60)
Glucose, Bld: 86 mg/dL (ref 70–99)
Potassium: 3.5 mmol/L (ref 3.5–5.1)
Sodium: 140 mmol/L (ref 135–145)

## 2020-11-18 LAB — LIPASE, BLOOD: Lipase: 29 U/L (ref 11–51)

## 2020-11-18 LAB — HEPATIC FUNCTION PANEL
ALT: 34 U/L (ref 0–44)
AST: 27 U/L (ref 15–41)
Albumin: 3.2 g/dL — ABNORMAL LOW (ref 3.5–5.0)
Alkaline Phosphatase: 42 U/L (ref 38–126)
Bilirubin, Direct: 0.1 mg/dL (ref 0.0–0.2)
Indirect Bilirubin: 0.3 mg/dL (ref 0.3–0.9)
Total Bilirubin: 0.4 mg/dL (ref 0.3–1.2)
Total Protein: 5.6 g/dL — ABNORMAL LOW (ref 6.5–8.1)

## 2020-11-18 LAB — TROPONIN I (HIGH SENSITIVITY): Troponin I (High Sensitivity): 2 ng/L (ref <18)

## 2020-11-18 MED ORDER — HYDROMORPHONE HCL 1 MG/ML IJ SOLN
1.0000 mg | Freq: Once | INTRAMUSCULAR | Status: AC
Start: 2020-11-18 — End: 2020-11-18
  Administered 2020-11-18: 1 mg via INTRAVENOUS
  Filled 2020-11-18: qty 1

## 2020-11-18 MED ORDER — IOHEXOL 300 MG/ML  SOLN
100.0000 mL | Freq: Once | INTRAMUSCULAR | Status: AC | PRN
  Administered 2020-11-18: 100 mL via INTRAVENOUS

## 2020-11-18 NOTE — ED Triage Notes (Addendum)
Pt via ems. Pt reports epigastric/abd pain that radiates to back since 0300 am. Had same pain x1 month ago and was diagnosed with lung cancer. Pt reports to have procedure due on March 31st for further lung cancer work up. Denies chest pain. A&Ox4. Respirations regular/unlabored.

## 2020-11-18 NOTE — ED Provider Notes (Signed)
[facility] EMERGENCY DEPARTMENT Provider Note   CSN: [id number] Arrival date & time: 11/18/20  4481     History Chief Complaint  Patient presents with  . Chest Pain    [name] is a 65 y.o. male.  The history is provided by the patient and medical records.  Chest Pain  [name] is a 65 y.o. male who presents to the Emergency Department complaining of chest pain. He presents the emergency department by EMS complaining of chest pain that started around 3 AM. Pain is located in the lower central chest and radiates bilaterally. He describes it as sharp and squeezing in nature. He has associated nausea. Symptoms are constant but do worsen at times. There are no clear alleviating or worsening factors. He states that this feels different from his prior heart attack. He did have a similar episode one month ago was evaluated in the emergency department and diagnosed with a lung mass. He is scheduled for further workup for this lung mass next week. He did have shortness of breath and this started, but shortness of breath is now resolved. No fevers, cough, vomiting, diarrhea. He did have COVID-19 infection in January of this year.    Past Medical History:  Diagnosis Date  . Anxiety   . Arthritis   . Basal cell carcinoma (BCC) of forehead   . CAD (coronary artery disease)    a. 10/2015 ant STEMI >> LHC with 3 v CAD; oLAD tx with POBA >> emergent CABG. b. Multiple evals since that time, early graft failure of SVG-RCA by cath 03/2016. c. 2/19 PCI/DES x1 to pRCA, normal EF.  . Carotid artery disease ([place])    a. 40-59% BICA 02/2018.  Marland Kitchen Depression   . Dyspnea   . Ectopic atrial tachycardia ([place])   . Esophageal reflux    eosinophil esophagitis  . Family history of adverse reaction to anesthesia    "sister has PONV" (06/21/2017)  . Former tobacco use   . Gout   . Hepatitis C    "treated and cured" (06/21/2017)  . High cholesterol   . History of kidney stones    . Hypertension   . Ischemic cardiomyopathy    a. EF 25-30% at intraop TEE 4/17  //  b. Limited Echo 5/17 - EF 45-50%, mild ant HK. c. EF 55-65% by cath 09/2017.  . Migraine    "3-4/yr" (06/21/2017)  . Myocardial infarction ([place]) 10/2015  . Palpitations   . Sinus bradycardia    a. HR dropping into 40s in 02/2016 -> BB reduced.  . Stroke ([place]) 10/2016   "small one; sometimes my memory/cognitive issues" (06/21/2017)  . Symptomatic hypotension    a. 02/2016 ER visit -> meds reduced.  . Syncope     Patient Active Problem List   Diagnosis Date Noted  . Chest pain 11/07/2019  . Gastroesophageal reflux disease   . Cervical radiculopathy 10/17/2018  . Preoperative clearance 10/04/2018  . Palpitations   . Coronary artery disease involving coronary bypass graft of native heart with angina pectoris ([place])   . Transient loss of consciousness 03/24/2018  . Ectopic atrial tachycardia ([place]) 02/09/2018  . Central chest pain 03/10/2017  . Family hx-stroke 11/10/2016  . Stroke-like episode ([place]) - R brain, s/p tPA 11/09/2016  . Unstable angina ([place]) 09/07/2016  . Claudication of both lower extremities ([place])   . Pure hypercholesterolemia   . Tobacco abuse disorder   . CAD of autologous artery bypass graft without angina   .  Chest pain at rest 06/10/2016  . Abnormal nuclear stress test - HIGH RISK 04/20/2016  . Old MI (myocardial infarction)   . Essential hypertension 02/26/2016  . Ischemic cardiomyopathy 12/25/2015  . Hyperlipidemia LDL goal <70 12/25/2015  . 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 Coronary artery disease involving native coronary artery of native heart with angina pectoris ([place]) 11/28/2015  . 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 Acute MI anterior wall first episode care [facility])   . Precordial chest pain 03/07/2015  . Dysphagia 03/07/2015  . Gout attack 03/07/2015  . Mixed bipolar I disorder ([place]) 03/07/2015  . Fibromyalgia 07/09/2014  . Gout 07/09/2014  . Anxiety  07/09/2014  . Depression 07/09/2014  . Hepatitis C 11/20/2012  . Eosinophilic esophagitis 67/89/3810    Past Surgical History:  Procedure Laterality Date  . ANTERIOR CERVICAL DECOMP/DISCECTOMY FUSION N/A 10/17/2018   Procedure: Anterior Cervical Decompression Fusion - Cervical seven -Thoracic one;  Surgeon: Consuella Lose, MD;  Location: [facility];  Service: Neurosurgery;  Laterality: N/A;  . BASAL CELL CARCINOMA EXCISION     "forehead  . BIOPSY  07/20/2019   Procedure: BIOPSY;  Surgeon: Carol Ada, MD;  Location: [facility];  Service: Endoscopy;;  . CARDIAC CATHETERIZATION N/A 11/28/2015   Procedure: Left Heart Cath and Coronary Angiography;  Surgeon: [name], MD;  Location: [facility];  Service: Cardiovascular;  Laterality: N/A;  . CARDIAC CATHETERIZATION N/A 11/28/2015   Procedure: Coronary Balloon Angioplasty;  Surgeon: [name], MD;  Location: [facility];  Service: Cardiovascular;  Laterality: N/A;  ostial LAD  . CARDIAC CATHETERIZATION N/A 11/28/2015   Procedure: Coronary/Graft Angiography;  Surgeon: [name], MD;  Location: [facility];  Service: Cardiovascular;  Laterality: N/A;  coronaries only   . CARDIAC CATHETERIZATION N/A 04/21/2016   Procedure: Left Heart Cath and Coronary Angiography;  Surgeon: Wellington Hampshire, MD;  Location: [facility];  Service: Cardiovascular;  Laterality: N/A;  . CARDIAC CATHETERIZATION N/A 06/14/2016   Procedure: Left Heart Cath and Cors/Grafts Angiography;  Surgeon: Lorretta Harp, MD;  Location: [facility] CV LAB;  Service: Cardiovascular;  Laterality: N/A;  . CARDIAC CATHETERIZATION N/A 09/08/2016   Procedure: Left Heart Cath and Cors/Grafts Angiography;  Surgeon: Wellington Hampshire, MD;  Location: [facility];  Service: Cardiovascular;  Laterality: N/A;  . CARDIAC CATHETERIZATION    . CORONARY ARTERY BYPASS GRAFT N/A 11/28/2015   Procedure: CORONARY ARTERY BYPASS GRAFTING  (CABG) TIMES FIVE USING LEFT INTERNAL MAMMARY ARTERY AND RIGHT GREATER SAPHENOUS,VIEN HARVEATED BY ENDOVIEN, INTRAOPPRATIVE TEE;  Surgeon: Gaye Pollack, MD;  Location: [facility];  Service: Open Heart Surgery;  Laterality: N/A;  . CORONARY STENT INTERVENTION N/A 10/05/2017   Procedure: CORONARY STENT INTERVENTION;  Surgeon: [name], MD;  Location: [facility];  Service: Cardiovascular;  Laterality: N/A;  . ESOPHAGOGASTRODUODENOSCOPY (EGD) WITH PROPOFOL N/A 07/20/2019   Procedure: ESOPHAGOGASTRODUODENOSCOPY (EGD) WITH PROPOFOL;  Surgeon: Carol Ada, MD;  [facility];  Service: Endoscopy;  Laterality: N/A;  . HUMERUS SURGERY Right 1969   "tumor inside bone; filled it w/bone chips"  . LEFT HEART CATH AND CORS/GRAFTS ANGIOGRAPHY N/A 03/11/2017   Procedure: Left Heart Cath and Cors/Grafts Angiography;  Surgeon: Leonie Man, MD;  Location: [facility];  Service: Cardiovascular;  Laterality: N/A;  . LEFT HEART CATH AND CORS/GRAFTS ANGIOGRAPHY N/A 10/05/2017   Procedure: LEFT HEART CATH AND CORS/GRAFTS ANGIOGRAPHY;  Surgeon: Irish Lack,  Charlann Lange, MD;  Location: [facility];  Service: Cardiovascular;  Laterality: N/A;  . LEFT HEART CATH AND CORS/GRAFTS ANGIOGRAPHY N/A 04/11/2019   Procedure: LEFT HEART CATH AND CORS/GRAFTS ANGIOGRAPHY;  Surgeon: [name], MD;  Location: [facility];  Service: Cardiovascular;  Laterality: N/A;  . PERIPHERAL VASCULAR CATHETERIZATION N/A 06/14/2016   Procedure: Lower Extremity Angiography;  Surgeon: Lorretta Harp, MD;  Location: [facility];  Service: Cardiovascular;  Laterality: N/A;       Family History  Problem Relation Age of Onset  . Lung cancer Mother   . Heart Problems Father   . Heart attack Father 87  . Stroke Father   . Heart failure Father   . Heart attack Maternal Grandmother   . Stroke Maternal Grandmother   . Heart attack Paternal Uncle   . Hypertension Brother   . Autoimmune  disease Neg Hx     Social History   Tobacco Use  . Smoking status: Former Smoker    Packs/day: 0.75    Years: 44.00    Pack years: 33.00    Types: Cigarettes    Quit date: 11/28/2015    Years since quitting: 4.9  . Smokeless tobacco: Never Used  Vaping Use  . Vaping Use: Never used  Substance Use Topics  . Alcohol use: Yes    Comment: occ  . Drug use: No    Comment: 06/21/2017 "nothing since the 1980s"    Home Medications Prior to Admission medications   Medication Sig Start Date End Date Taking? Authorizing Provider  acetaminophen (TYLENOL) 500 MG tablet Take 1,000 mg by mouth every 6 (six) hours as needed for mild pain or headache.     [provider]  amLODipine (NORVASC) 10 MG tablet TAKE 1 TABLET(10 MG) BY MOUTH DAILY 03/10/20   [name], MD  aspirin 81 MG tablet Take 1 tablet (81 mg total) by mouth daily. 10/24/18   Costella, Vista Mink, PA-C  atorvastatin (LIPITOR) 80 MG tablet TAKE 1 TABLET(80 MG) BY MOUTH DAILY 12/10/19   [name], MD  chlordiazePOXIDE (LIBRIUM) 10 MG capsule Take 10-20 mg by mouth 3 (three) times daily as needed for anxiety.     [provider]  cloNIDine (CATAPRES) 0.1 MG tablet Take 0.1 mg by mouth at bedtime. 04/09/20   [provider]  clopidogrel (PLAVIX) 75 MG tablet TAKE 1 TABLET BY MOUTH EVERY DAY 02/06/20   [name], MD  lisinopril (ZESTRIL) 5 MG tablet TAKE 1 TABLET BY MOUTH DAILY. MAY TAKE ADDITIONAL TABLET ONCE DAILY FOR SYSTOLIC BLOOD PRESSURE GREATER THAN 150 ONE HOUR AFTER TAKING DAILY DOSE 05/13/20   [name], MD  metoprolol succinate (TOPROL-XL) 25 MG 24 hr tablet Take 1 tablet (25 mg total) by mouth daily. 08/27/20   [name], MD  Multiple Vitamins-Minerals (ONE-A-DAY MENS 50+ ADVANTAGE PO) Take 1 tablet by mouth daily.     [provider]  nitroGLYCERIN (NITROSTAT) 0.4 MG SL tablet PLACE 1 TABLET UNDER THE TONGUE EVERY 5 MINUTES AS NEEDED FOR CHEST  PAIN. 3 DOSES MAX 04/16/20   [name], MD  oxyCODONE-acetaminophen (PERCOCET/ROXICET) 5-325 MG tablet Take 1 tablet by mouth every 6 (six) hours as needed. 11/07/20   [provider]  pantoprazole (PROTONIX) 40 MG tablet Take 40 mg by mouth every evening.     [provider]  ranolazine (RANEXA) 1000 MG SR tablet Take 1 tablet (1,000 mg total) by mouth  2 (two) times daily. 11/08/19   [name], NP  Vilazodone HCl (VIIBRYD) 10 MG TABS 1 tablet with food    [provider]    Allergies    Prednisone, Tetanus toxoids, Wellbutrin [bupropion], Morphine and related, Scallops [shellfish allergy], and Chantix [varenicline]  Review of Systems   Review of Systems  Cardiovascular: Positive for chest pain.  All other systems reviewed and are negative.   Physical Exam Updated Vital Signs BP 116/67 (BP Location: Right Arm)   Pulse 71   Temp 98.3 F (36.8 C) (Oral)   Resp 19   Ht 5\' 9"  (1.753 m)   Wt 88.5 kg   SpO2 100%   BMI 28.80 kg/m   Physical Exam Vitals and nursing note reviewed.  Constitutional:      Appearance: He is well-developed.  HENT:     Head: Normocephalic and atraumatic.  Cardiovascular:     Rate and Rhythm: Normal rate and regular rhythm.     Heart sounds: No murmur heard.   Pulmonary:     Effort: Pulmonary effort is normal. No respiratory distress.     Breath sounds: Normal breath sounds.  Abdominal:     Palpations: Abdomen is soft.     Tenderness: There is no guarding or rebound.     Comments: Moderate generalized abdominal tenderness  Musculoskeletal:        General: No tenderness.     Comments: 2+ DP pulses bilaterally  Skin:    General: Skin is warm and dry.  Neurological:     Mental Status: He is alert and oriented to person, place, and time.  Psychiatric:        Behavior: Behavior normal.     ED Results / Procedures / Treatments   Labs (all labs ordered are listed, but only abnormal results are  displayed) Labs Reviewed  CBC WITH DIFFERENTIAL/PLATELET - Abnormal; Notable for the following components:      Result Value   RBC 4.12 (*)    Hemoglobin 12.4 (*)    HCT 37.3 (*)    All other components within normal limits  BASIC METABOLIC PANEL - Abnormal; Notable for the following components:   Chloride 113 (*)    CO2 19 (*)    Calcium 7.4 (*)    All other components within normal limits  HEPATIC FUNCTION PANEL - Abnormal; Notable for the following components:   Total Protein 5.6 (*)    Albumin 3.2 (*)    All other components within normal limits  LIPASE, BLOOD  URINALYSIS, ROUTINE W REFLEX MICROSCOPIC  TROPONIN I (HIGH SENSITIVITY)  TROPONIN I (HIGH SENSITIVITY)    EKG EKG Interpretation  Date/Time:  Tuesday November 18 2020 06:14:35 EDT Ventricular Rate:  77 PR Interval:    QRS Duration: 100 QT Interval:  391 QTC Calculation: 443 R Axis:   79 Text Interpretation: Sinus rhythm Confirmed by Dory Horn) on 11/18/2020 6:20:34 AM   Radiology CT Abdomen Pelvis W Contrast  Result Date: 11/18/2020 CLINICAL DATA:  65 year old male with hepatitis C. Epigastric and abdominal pain radiating to the back since 0300 hours. Suspicious right upper lobe lung mass/nodule on CTA last month. EXAM: CT ABDOMEN AND PELVIS WITH CONTRAST TECHNIQUE: Multidetector CT imaging of the abdomen and pelvis was performed using the standard protocol following bolus administration of intravenous contrast. CONTRAST:  177mL OMNIPAQUE IOHEXOL 300 MG/ML  SOLN COMPARISON:  Chest CTA 10/24/2020. Noncontrast CT Abdomen and Pelvis 05/27/2020. CT Abdomen and Pelvis with contrast 07/17/2019. FINDINGS: Lower  chest: Lung bases are stable and negative. No pericardial or pleural effusion. Hepatobiliary: Stable liver since 2020, with evidence of mild hepatic steatosis and a punctate low-density area at the dome which is likely a tiny benign cyst. Negative gallbladder. No bile duct enlargement. Pancreas: Negative.  Spleen: Negative.  No splenomegaly. Adrenals/Urinary Tract: Normal adrenal glands. Kidneys appears stable since 2020 and negative aside from punctate right upper pole nephrolithiasis. Small chronic left renal upper pole cyst. No nephrolithiasis. Symmetric renal contrast excretion. Negative ureters and bladder. Stomach/Bowel: Mild diverticulosis in the sigmoid colon. Otherwise negative large bowel. Normal appendix on series 2, image 57. Negative terminal ileum. No dilated small bowel. Decompressed stomach. No free air, free fluid, mesenteric inflammation. Vascular/Lymphatic: Aortoiliac calcified atherosclerosis. Major arterial structures are patent. Portal venous system is patent. No lymphadenopathy. Reproductive: Negative. Other: No pelvic free fluid. Musculoskeletal: Chronic or congenital left L5 posterior element defect. No acute or suspicious osseous lesion. IMPRESSION: 1. No acute, inflammatory, or metastatic process in the abdomen or pelvis. 2. Mild chronic hepatic steatosis. Punctate right nephrolithiasis. Mild sigmoid diverticulosis. Aortic Atherosclerosis (ICD10-I70.0). Electronically Signed   By: Genevie Ann M.D.   On: 11/18/2020 07:52   DG Chest Portable 1 View  Result Date: 11/18/2020 CLINICAL DATA:  65 year old male with epigastric pain radiating to the back. Suspected right upper lobe lung cancer on chest CTA last month. EXAM: PORTABLE CHEST 1 VIEW COMPARISON:  Chest CTA 10/24/2020 and earlier. FINDINGS: Portable AP view at 0624 hours. Lordotic positioning today. Right upper lobe lung nodule projects over the posterior 6th rib on this image. Mediastinal contours remain normal. Prior sternotomy. No pneumothorax, pulmonary edema, pleural effusion or new pulmonary opacity. Stable visualized osseous structures. Lower cervical ACDF. Paucity of bowel gas in the upper abdomen. IMPRESSION: Known right upper lobe lung nodule/mass. No new cardiopulmonary abnormality. Electronically Signed   By: Genevie Ann M.D.   On:  11/18/2020 06:45    Procedures Procedures   Medications Ordered in ED Medications  HYDROmorphone (DILAUDID) injection 1 mg (1 mg Intravenous Given 11/18/20 0759)  iohexol (OMNIPAQUE) 300 MG/ML solution 100 mL (100 mLs Intravenous Contrast Given 11/18/20 0725)    ED Course  I have reviewed the triage vital signs and the nursing notes.  Pertinent labs & imaging results that were available during my care of the patient were reviewed by me and considered in my medical decision making (see chart for details).    MDM Rules/Calculators/A&P                         patient with history of coronary artery disease, recent diagnosis of lung mass here for evaluation of epigastric abdominal pain/chest pain. He does have abdominal tenderness on examination without peritoneal findings. EKG is without acute ischemic changes. Current presentation is not consistent with ACS, dissection, PE, pneumonia. CT abdomen pelvis was obtained, which was negative for obstruction, pancreatitis, appendicitis. Discussed with patient findings of studies, possible gastritis/reflux contributing to symptoms. Recommend over-the-counter and acid such as Zantac or Pepcid. Discussed outpatient follow-up and return precautions.  Final Clinical Impression(s) / ED Diagnoses Final diagnoses:  Epigastric abdominal pain    Rx / DC Orders ED Discharge Orders    None       Quintella Reichert, MD 11/18/20 [phone]

## 2020-11-18 NOTE — ED Notes (Signed)
2nd troponin canceled per verbal from [place]

## 2020-11-18 NOTE — ED Notes (Signed)
Patient transported to CT 

## 2020-11-18 NOTE — ED Notes (Signed)
ED Provider at bedside. 

## 2020-11-18 NOTE — ED Notes (Signed)
Pt endorses [employer] for transport at d/c

## 2020-11-18 NOTE — ED Notes (Signed)
Pt discharged to home. Discharge instructions have been discussed with patient and/or family members. Pt verbally acknowledges understanding d/c instructions, and endorses comprehension to checkout at registration before leaving.  °

## 2020-11-18 NOTE — ED Notes (Signed)
Pt via ems to room 01. Agree with triage note. A&Ox4. Respirations regular/unlabored. NAD. Connected to cardiac monitor, BP and pulse ox. Stretcher low, wheels locked, call bell within reach.

## 2020-11-21 NOTE — Telephone Encounter (Signed)
Dr. Valeta Harms, please see mychart messages sent by pt and advise:  To: [facility] POOL    From: [name]    Created: 11/21/2020 12:46 AM     *-*-*This message was handled on 11/21/2020 8:31 AM by Shalae Belmonte P*-*-*  Sometimes I wish we didn't get results, but is this result problematic?? Do I have COPD??      To: [facility] POOL    From: [name]    Created: 11/21/2020 12:19 AM     *-*-*This message has not been handled.*-*-*  What does the "L" mean on the Pulmonary test??

## 2020-11-21 NOTE — Telephone Encounter (Signed)
Dr. Valeta Harms the pt is concerned and wondered if he should be taking something for the COPD?

## 2020-11-21 NOTE — Telephone Encounter (Signed)
Closing this encounter as it a duplicate to another open encounter.

## 2020-11-25 ENCOUNTER — Telehealth: Payer: Self-pay | Admitting: Interventional Cardiology

## 2020-11-25 NOTE — Telephone Encounter (Signed)
STAT if HR is under 50 or over 120 (normal HR is 60-100 beats per minute)  1) What is your heart rate? 135, 120, 110,-not at this time- seems like this happen when he move around or any light exertion- he does not know if this might be an after effect after Covid  2) Do you have a log of your heart rate readings (document readings)? no 3) Do you have any other symptoms?no

## 2020-11-25 NOTE — Telephone Encounter (Signed)
Called patient back in regards to HR.  He reports that his heart rate ranges from 60's- 130.  He reports DOE but this has occurred since he was diagnosed with COVID 2 months.  He says he can feel his heart racing at random times.   He recently had an ED visit on 11/18/20 for epigastric pain.  At that visit his EKG showed SR HR 77.  He called in on 11/12/20 Dr. Irish Lack recommended that he take 1 extra dose of Toprol QD when he experiences a high HR.  He expressed that he forgot that he was told to do that.  I advised him to take an extra dose of Toprol with high HR above 120 with symptoms such as palpitations.  He expresses that he understands.  He also spoke with me regarding other health issues he is currently experiencing.  He may have lung cancer.  I asked if he feels anxious d/t this he stated no he thinks he is handling things well.   I advised him to call the office if after the extra dose of metoprolol his HR continues to be elevated.  He expressed that he would.

## 2020-11-27 ENCOUNTER — Other Ambulatory Visit: Payer: Self-pay

## 2020-11-27 ENCOUNTER — Ambulatory Visit (HOSPITAL_COMMUNITY)
Admission: RE | Admit: 2020-11-27 | Discharge: 2020-11-27 | Disposition: A | Discharge status: Home / Self Care | Payer: Medicare Other | Source: Ambulatory Visit | Attending: Pulmonary Disease | Admitting: Pulmonary Disease

## 2020-11-27 ENCOUNTER — Telehealth: Payer: Self-pay | Admitting: Pulmonary Disease

## 2020-11-27 DIAGNOSIS — Z87891 Personal history of nicotine dependence: Secondary | ICD-10-CM

## 2020-11-27 DIAGNOSIS — R911 Solitary pulmonary nodule: Secondary | ICD-10-CM | POA: Insufficient documentation

## 2020-11-27 DIAGNOSIS — M899 Disorder of bone, unspecified: Secondary | ICD-10-CM | POA: Insufficient documentation

## 2020-11-27 LAB — GLUCOSE, CAPILLARY: Glucose-Capillary: 105 mg/dL — ABNORMAL HIGH (ref 70–99)

## 2020-11-27 MED ORDER — FLUDEOXYGLUCOSE F - 18 (FDG) INJECTION
9.8100 | Freq: Once | INTRAVENOUS | Status: AC
  Administered 2020-11-27: 9.81 via INTRAVENOUS

## 2020-11-27 NOTE — Telephone Encounter (Signed)
IMPRESSION: RIGHT upper lobe nodule with marked hypermetabolic features and moderate hypermetabolic lymph node in the RIGHT hilum suspicious for primary bronchogenic neoplasm with potential involvement of RIGHT hilar lymph nodes.  Focal activity at the GE junction without discernible mass. There is some mild soft tissue fullness in this area. Consider esophagoscopy if not recently performed for further evaluation, to exclude small neoplasm.  Asymmetry of the RIGHT oropharynx, question of ulcerative changes with mildly asymmetric uptake, direct visualization or CT of the neck may be helpful for further assessment.  Benign-appearing bone lesion in the RIGHT proximal humerus potentially aneurysmal bone cyst, present as far back as 2007. Consider dedicated humeral evaluation as warranted, particularly if there are symptoms of pain in this location on follow-up.  There is some mildly asymmetric FDG uptake outside of the bone within musculature about the RIGHT shoulder. Correlate with any pain in this location.  [facility] and spoke with Valarie Merino about pt's PET scan results. Stated to [place] that I was going to send this to Dr. Valeta Harms and he verbalized understanding.  Routing to Dr. Valeta Harms.

## 2020-11-27 NOTE — Telephone Encounter (Signed)
Thanks  Garner Nash, DO [facility] 11/27/2020 6:08 PM

## 2020-11-28 ENCOUNTER — Encounter: Payer: Self-pay | Admitting: Thoracic Surgery (Cardiothoracic Vascular Surgery)

## 2020-11-28 ENCOUNTER — Institutional Professional Consult (permissible substitution) (INDEPENDENT_AMBULATORY_CARE_PROVIDER_SITE_OTHER): Payer: Medicare Other | Admitting: Thoracic Surgery (Cardiothoracic Vascular Surgery)

## 2020-11-28 ENCOUNTER — Encounter: Payer: Self-pay | Admitting: *Deleted

## 2020-11-28 ENCOUNTER — Telehealth: Payer: Self-pay | Admitting: Pulmonary Disease

## 2020-11-28 ENCOUNTER — Other Ambulatory Visit: Payer: Self-pay | Admitting: *Deleted

## 2020-11-28 VITALS — BP 146/90 | HR 82 | Resp 20 | Ht 69.0 in | Wt 200.0 lb

## 2020-11-28 DIAGNOSIS — R911 Solitary pulmonary nodule: Secondary | ICD-10-CM

## 2020-11-28 NOTE — Telephone Encounter (Signed)
PCCM:  Orders placed for combo ENB+RATS for 12/24/2020. Super D orders placed   Garner Nash, DO [facility] 11/28/2020 5:43 PM

## 2020-11-28 NOTE — Telephone Encounter (Signed)
-----   Message from Laury Deep, RN sent at 11/28/2020  3:30 PM EDT ----- I spoke with HL and we picked 4/27 for surgery day so patient is all set and booked for 8:30am in OR 10.  You can book your part at 7:30am that day.  I have his pre-op and COVID test scheduled for 4/25 and surgery orders are placed.  Thanks, Thurmond Butts ----- Message ----- From: Garner Nash, DO Sent: 11/28/2020   1:56 PM EDT To: Darral Dash, RN, Laury Deep, RN, #   Ok thanks  Date options:  April 11-14th  April 25th through 29th  Do any these work ryan?  Brad   ----- Message ----- From: [name], MD Sent: 11/28/2020   1:47 PM EDT To: Garner Nash, DO  Hey, Thanks for sending Mr. Ndiaye.  He is agreeable to proceed with a combo procedure.  H

## 2020-11-28 NOTE — H&P (View-Only) (Signed)
[address]       [place] [address]             [phone]                    Santanna Edward [facility] Medical Record #[id number] Date of Birth: [birth year]  Referring: Garner Nash, DO Primary Care: [name], MD Primary Cardiologist: Larae Grooms, MD  Chief Complaint:    Chief Complaint  Patient presents with  . Lung Lesion    Initial Surgical Consult, CT chest 2/25, PET yesterday, PFT 3/17    History of Present Illness:    [name] 65 y.o. male referred by Dr. Valeta Harms for surgical evaluation of a right upper lobe pulmonary nodule.  This was found incidentally when he was being worked up for chest pain.  He does have a history of coronary artery bypass grafting performed by Dr. Caffie Pinto in 2017.  Following that date he has been tobacco free.  He was infected with Covid late last year and had symptoms for over 2 months.  He continues to have some pleuritic chest pain and mental fog.    Smoking Hx: Quit in 2017   Zubrod Score: At the time of surgery this patient's most appropriate activity status/level should be described as: [x]     0    Normal activity, no symptoms []     1    Restricted in physical strenuous activity but ambulatory, able to do out light work []     2    Ambulatory and capable of self care, unable to do work activities, up and about               >50 % of waking hours                              []     3    Only limited self care, in bed greater than 50% of waking hours []     4    Completely disabled, no self care, confined to bed or chair []     5    Moribund   Past Medical History:  Diagnosis Date  . Anxiety   . Arthritis   . Basal cell carcinoma (BCC) of forehead   . CAD (coronary artery disease)    a. 10/2015 ant STEMI >> LHC with 3 v CAD; oLAD tx with POBA >> emergent CABG. b. Multiple evals since that time, early graft failure of SVG-RCA by cath 03/2016. c. 2/19 PCI/DES x1 to pRCA, normal EF.  .  Carotid artery disease ([place])    a. 40-59% BICA 02/2018.  Marland Kitchen Depression   . Dyspnea   . Ectopic atrial tachycardia ([place])   . Esophageal reflux    eosinophil esophagitis  . Family history of adverse reaction to anesthesia    "sister has PONV" (06/21/2017)  . Former tobacco use   . Gout   . Hepatitis C    "treated and cured" (06/21/2017)  . High cholesterol   . History of kidney stones   . Hypertension   . Ischemic cardiomyopathy    a. EF 25-30% at intraop TEE 4/17  //  b. Limited Echo 5/17 - EF 45-50%, mild ant HK. c. EF 55-65% by cath 09/2017.  . Migraine    "3-4/yr" (06/21/2017)  . Myocardial infarction ([place]) 10/2015  . Palpitations   . Sinus bradycardia  a. HR dropping into 40s in 02/2016 -> BB reduced.  . Stroke ([place]) 10/2016   "small one; sometimes my memory/cognitive issues" (06/21/2017)  . Symptomatic hypotension    a. 02/2016 ER visit -> meds reduced.  . Syncope     Past Surgical History:  Procedure Laterality Date  . ANTERIOR CERVICAL DECOMP/DISCECTOMY FUSION N/A 10/17/2018   Procedure: Anterior Cervical Decompression Fusion - Cervical seven -Thoracic one;  Surgeon: Consuella Lose, MD;  Location: [facility];  Service: Neurosurgery;  Laterality: N/A;  . BASAL CELL CARCINOMA EXCISION     "forehead  . BIOPSY  07/20/2019   Procedure: BIOPSY;  Surgeon: Carol Ada, MD;  Location: [facility];  Service: Endoscopy;;  . CARDIAC CATHETERIZATION N/A 11/28/2015   Procedure: Left Heart Cath and Coronary Angiography;  Surgeon: [name], MD;  Location: [facility];  Service: Cardiovascular;  Laterality: N/A;  . CARDIAC CATHETERIZATION N/A 11/28/2015   Procedure: Coronary Balloon Angioplasty;  Surgeon: [name], MD;  Location: [facility];  Service: Cardiovascular;  Laterality: N/A;  ostial LAD  . CARDIAC CATHETERIZATION N/A 11/28/2015   Procedure: Coronary/Graft Angiography;  Surgeon: [name], MD;  Location: [facility];  Service:  Cardiovascular;  Laterality: N/A;  coronaries only   . CARDIAC CATHETERIZATION N/A 04/21/2016   Procedure: Left Heart Cath and Coronary Angiography;  Surgeon: Wellington Hampshire, MD;  Location: [facility];  Service: Cardiovascular;  Laterality: N/A;  . CARDIAC CATHETERIZATION N/A 06/14/2016   Procedure: Left Heart Cath and Cors/Grafts Angiography;  Surgeon: Lorretta Harp, MD;  Location: [facility];  Service: Cardiovascular;  Laterality: N/A;  . CARDIAC CATHETERIZATION N/A 09/08/2016   Procedure: Left Heart Cath and Cors/Grafts Angiography;  Surgeon: Wellington Hampshire, MD;  Location: [facility];  Service: Cardiovascular;  Laterality: N/A;  . CARDIAC CATHETERIZATION    . CORONARY ARTERY BYPASS GRAFT N/A 11/28/2015   Procedure: CORONARY ARTERY BYPASS GRAFTING (CABG) TIMES FIVE USING LEFT INTERNAL MAMMARY ARTERY AND RIGHT GREATER SAPHENOUS,VIEN HARVEATED BY ENDOVIEN, INTRAOPPRATIVE TEE;  Surgeon: Gaye Pollack, MD;  Location: [facility];  Service: Open Heart Surgery;  Laterality: N/A;  . CORONARY STENT INTERVENTION N/A 10/05/2017   Procedure: CORONARY STENT INTERVENTION;  Surgeon: [name], MD;  Location: [facility];  Service: Cardiovascular;  Laterality: N/A;  . ESOPHAGOGASTRODUODENOSCOPY (EGD) WITH PROPOFOL N/A 07/20/2019   Procedure: ESOPHAGOGASTRODUODENOSCOPY (EGD) WITH PROPOFOL;  Surgeon: Carol Ada, MD;  [facility]: [facility];  Service: Endoscopy;  Laterality: N/A;  . HUMERUS SURGERY Right 1969   "tumor inside bone; filled it w/bone chips"  . LEFT HEART CATH AND CORS/GRAFTS ANGIOGRAPHY N/A 03/11/2017   Procedure: Left Heart Cath and Cors/Grafts Angiography;  Surgeon: Leonie Man, MD;  Location: [facility];  Service: Cardiovascular;  Laterality: N/A;  . LEFT HEART CATH AND CORS/GRAFTS ANGIOGRAPHY N/A 10/05/2017   Procedure: LEFT HEART CATH AND CORS/GRAFTS ANGIOGRAPHY;  Surgeon: [name], MD;  Location: [facility];  Service:  Cardiovascular;  Laterality: N/A;  . LEFT HEART CATH AND CORS/GRAFTS ANGIOGRAPHY N/A 04/11/2019   Procedure: LEFT HEART CATH AND CORS/GRAFTS ANGIOGRAPHY;  Surgeon: [name], MD;  Location: [facility];  Service: Cardiovascular;  Laterality: N/A;  . PERIPHERAL VASCULAR CATHETERIZATION N/A 06/14/2016   Procedure: Lower Extremity Angiography;  Surgeon: Lorretta Harp, MD;  Location: [facility];  Service: Cardiovascular;  Laterality: N/A;    Family History  Problem  Relation Age of Onset  . Lung cancer Mother   . Heart Problems Father   . Heart attack Father 39  . Stroke Father   . Heart failure Father   . Heart attack Maternal Grandmother   . Stroke Maternal Grandmother   . Heart attack Paternal Uncle   . Hypertension Brother   . Autoimmune disease Neg Hx      Social History   Tobacco Use  Smoking Status Former Smoker  . Packs/day: 0.75  . Years: 44.00  . Pack years: 33.00  . Types: Cigarettes  . Quit date: 11/28/2015  . Years since quitting: 5.0  Smokeless Tobacco Never Used    Social History   Substance and Sexual Activity  Alcohol Use Yes   Comment: occ     Allergies  Allergen Reactions  . Prednisone Other (See Comments)    States that this med makes him "crazy"  . Tetanus Toxoids Swelling and Other (See Comments)    Fever, Swelling of the arm   . Wellbutrin [Bupropion] Other (See Comments)    Crazy thoughts, nightmares  . Morphine And Related Hives and Itching    Redness at the injection site  . Scallops [Shellfish Allergy] Nausea Only  . Chantix [Varenicline] Other (See Comments)    Dreams    Current Outpatient Medications  Medication Sig Dispense Refill  . amLODipine (NORVASC) 10 MG tablet TAKE 1 TABLET(10 MG) BY MOUTH DAILY 90 tablet 1  . aspirin 81 MG tablet Take 1 tablet (81 mg total) by mouth daily. 30 tablet   . atorvastatin (LIPITOR) 80 MG tablet TAKE 1 TABLET(80 MG) BY MOUTH DAILY 90 tablet 2  . chlordiazePOXIDE  (LIBRIUM) 10 MG capsule Take 10-20 mg by mouth 3 (three) times daily as needed for anxiety.     . cloNIDine (CATAPRES) 0.1 MG tablet Take 0.1 mg by mouth at bedtime.    . clopidogrel (PLAVIX) 75 MG tablet TAKE 1 TABLET BY MOUTH EVERY DAY 90 tablet 1  . lisinopril (ZESTRIL) 5 MG tablet TAKE 1 TABLET BY MOUTH DAILY. MAY TAKE ADDITIONAL TABLET ONCE DAILY FOR SYSTOLIC BLOOD PRESSURE GREATER THAN 150 ONE HOUR AFTER TAKING DAILY DOSE 135 tablet 3  . metoprolol succinate (TOPROL-XL) 25 MG 24 hr tablet Take 1 tablet (25 mg total) by mouth daily. 90 tablet 3  . Multiple Vitamins-Minerals (ONE-A-DAY MENS 50+ ADVANTAGE PO) Take 1 tablet by mouth daily.     . nitroGLYCERIN (NITROSTAT) 0.4 MG SL tablet PLACE 1 TABLET UNDER THE TONGUE EVERY 5 MINUTES AS NEEDED FOR CHEST PAIN. 3 DOSES MAX 25 tablet 4  . ranolazine (RANEXA) 1000 MG SR tablet Take 1 tablet (1,000 mg total) by mouth 2 (two) times daily. 180 tablet 0  . acetaminophen (TYLENOL) 500 MG tablet Take 1,000 mg by mouth every 6 (six) hours as needed for mild pain or headache.  (Patient not taking: Reported on 11/28/2020)    . oxyCODONE-acetaminophen (PERCOCET/ROXICET) 5-325 MG tablet Take 1 tablet by mouth every 6 (six) hours as needed. (Patient not taking: Reported on 11/28/2020)    . pantoprazole (PROTONIX) 40 MG tablet Take 40 mg by mouth every evening.  (Patient not taking: Reported on 11/28/2020)    . Vilazodone HCl (VIIBRYD) 10 MG TABS 1 tablet with food (Patient not taking: Reported on 11/28/2020)     No current facility-administered medications for this visit.    Review of Systems  Constitutional: Negative for malaise/fatigue and weight loss.  Respiratory: Positive for shortness of breath.  Cardiovascular: Positive for chest pain.  Psychiatric/Behavioral: The patient is nervous/anxious.      PHYSICAL EXAMINATION: BP (!) 146/90 (BP Location: Left Arm, Patient Position: Sitting)   Pulse 82   Resp 20   Ht 5\' 9"  (1.753 m)   Wt 200 lb (90.7 kg)    SpO2 98% Comment: RA  BMI 29.53 kg/m  Physical Exam Constitutional:      General: He is not in acute distress.    Appearance: Normal appearance. He is normal weight. He is not ill-appearing or toxic-appearing.  HENT:     Head: Normocephalic and atraumatic.  Eyes:     Extraocular Movements: Extraocular movements intact.  Cardiovascular:     Rate and Rhythm: Normal rate.  Pulmonary:     Effort: Pulmonary effort is normal. No respiratory distress.  Abdominal:     General: Abdomen is flat. There is no distension.  Musculoskeletal:        General: Normal range of motion.     Cervical back: Normal range of motion.  Skin:    General: Skin is warm and dry.  Neurological:     General: No focal deficit present.     Mental Status: He is alert and oriented to person, place, and time.     Diagnostic Studies & Laboratory data:     Recent Radiology Findings:   CT Abdomen Pelvis W Contrast  Result Date: 11/18/2020 CLINICAL DATA:  65 year old male with hepatitis C. Epigastric and abdominal pain radiating to the back since 0300 hours. Suspicious right upper lobe lung mass/nodule on CTA last month. EXAM: CT ABDOMEN AND PELVIS WITH CONTRAST TECHNIQUE: Multidetector CT imaging of the abdomen and pelvis was performed using the standard protocol following bolus administration of intravenous contrast. CONTRAST:  11mL OMNIPAQUE IOHEXOL 300 MG/ML  SOLN COMPARISON:  Chest CTA 10/24/2020. Noncontrast CT Abdomen and Pelvis 05/27/2020. CT Abdomen and Pelvis with contrast 07/17/2019. FINDINGS: Lower chest: Lung bases are stable and negative. No pericardial or pleural effusion. Hepatobiliary: Stable liver since 2020, with evidence of mild hepatic steatosis and a punctate low-density area at the dome which is likely a tiny benign cyst. Negative gallbladder. No bile duct enlargement. Pancreas: Negative. Spleen: Negative.  No splenomegaly. Adrenals/Urinary Tract: Normal adrenal glands. Kidneys appears stable since  2020 and negative aside from punctate right upper pole nephrolithiasis. Small chronic left renal upper pole cyst. No nephrolithiasis. Symmetric renal contrast excretion. Negative ureters and bladder. Stomach/Bowel: Mild diverticulosis in the sigmoid colon. Otherwise negative large bowel. Normal appendix on series 2, image 57. Negative terminal ileum. No dilated small bowel. Decompressed stomach. No free air, free fluid, mesenteric inflammation. Vascular/Lymphatic: Aortoiliac calcified atherosclerosis. Major arterial structures are patent. Portal venous system is patent. No lymphadenopathy. Reproductive: Negative. Other: No pelvic free fluid. Musculoskeletal: Chronic or congenital left L5 posterior element defect. No acute or suspicious osseous lesion. IMPRESSION: 1. No acute, inflammatory, or metastatic process in the abdomen or pelvis. 2. Mild chronic hepatic steatosis. Punctate right nephrolithiasis. Mild sigmoid diverticulosis. Aortic Atherosclerosis (ICD10-I70.0). Electronically Signed   By: Genevie Ann M.D.   On: 11/18/2020 07:52   NM PET Image Initial (PI) Skull Base To Thigh  Result Date: 11/27/2020 CLINICAL DATA:  Initial treatment strategy for lung nodule. EXAM: NUCLEAR MEDICINE PET SKULL BASE TO THIGH TECHNIQUE: 9.81 mCi F-18 FDG was injected intravenously. Full-ring PET imaging was performed from the skull base to thigh after the radiotracer. CT data was obtained and used for attenuation correction and anatomic localization. Fasting blood glucose: 105 mg/dl  COMPARISON:  October 24, 2020 FINDINGS: Mediastinal blood pool activity: SUV max 2.30 Liver activity: SUV max NA NECK: There is misregistration artifact on the current PET. Native PET images do show mildly asymmetric PET activity in the RIGHT oro pharyngeal tonsillar tissue as compared to the LEFT. CT images show mild asymmetry of soft tissue also on the RIGHT as compared to the LEFT best exhibited on image 25 of series 4. Maximum SUV in this area is  approximately 6.8 as compared to 5.8 on the contralateral side. There is a suggestion of either secretions overlying this area or some ulceration on image 25 of series 4 as well. Incidental CT findings: none CHEST: RIGHT upper lobe nodule (image 27, series 8) approximately 2 x 1.9 cm, measurements difficult in terms of exact a mentions due to respiratory motion. Maximum SUV, markedly hypermetabolic with 70.3 max SUV. Mild increased metabolism about the RIGHT hilum max SUV of 3.3 on image 71 of series 4. Difficult to localize discrete nodal tissue in this location. There was some mildly prominent RIGHT hilar nodal tissue on the previous CT examination. Incidental CT findings: No consolidation. No pleural effusion. Signs of median sternotomy for coronary revascularization. Focal uptake at the GE junction is mild-to-moderate with a maximum SUV of 4.1. ABDOMEN/PELVIS: No abnormal hypermetabolic activity within the liver, pancreas, adrenal glands, or spleen. No hypermetabolic lymph nodes in the abdomen or pelvis. Incidental CT findings: Liver, gallbladder, pancreas, spleen, adrenal glands and kidneys without acute process. Smooth contour of the urinary bladder. No acute gastrointestinal findings. Normal appendix. Colonic diverticulosis. Aortic atherosclerosis without aneurysmal dilation of the abdominal aorta. Scattered small lymph nodes throughout the retroperitoneum none with hypermetabolic features. SKELETON: No focal hypermetabolic activity to suggest skeletal metastasis. Incidental CT findings: Signs of median sternotomy. Signs of anterior cervical discectomy and fusion at the cervicothoracic junction. Spinal degenerative changes throughout the spine. Areas of lucency with a central area of ground-glass attenuation in the RIGHT proximal humerus, present in hindsight as far back as 2020 with narrow zone of transition, also seen on chest x-ray in 2007. Mild asymmetric uptake about the RIGHT shoulder is favored to  represent muscular activity. Correlate with any pain in this location. IMPRESSION: RIGHT upper lobe nodule with marked hypermetabolic features and moderate hypermetabolic lymph node in the RIGHT hilum suspicious for primary bronchogenic neoplasm with potential involvement of RIGHT hilar lymph nodes. Focal activity at the GE junction without discernible mass. There is some mild soft tissue fullness in this area. Consider esophagoscopy if not recently performed for further evaluation, to exclude small neoplasm. Asymmetry of the RIGHT oropharynx, question of ulcerative changes with mildly asymmetric uptake, direct visualization or CT of the neck may be helpful for further assessment. Benign-appearing bone lesion in the RIGHT proximal humerus potentially aneurysmal bone cyst, present as far back as 2007. Consider dedicated humeral evaluation as warranted, particularly if there are symptoms of pain in this location on follow-up. There is some mildly asymmetric FDG uptake outside of the bone within musculature about the RIGHT shoulder. Correlate with any pain in this location. These results will be called to the ordering clinician or representative by the Radiologist Assistant, and communication documented in the PACS or [employer]. Electronically Signed   By: Zetta Bills M.D.   On: 11/27/2020 14:29   DG Chest Portable 1 View  Result Date: 11/18/2020 CLINICAL DATA:  65 year old male with epigastric pain radiating to the back. Suspected right upper lobe lung cancer on chest CTA last month. EXAM: PORTABLE  CHEST 1 VIEW COMPARISON:  Chest CTA 10/24/2020 and earlier. FINDINGS: Portable AP view at 0624 hours. Lordotic positioning today. Right upper lobe lung nodule projects over the posterior 6th rib on this image. Mediastinal contours remain normal. Prior sternotomy. No pneumothorax, pulmonary edema, pleural effusion or new pulmonary opacity. Stable visualized osseous structures. Lower cervical ACDF. Paucity of  bowel gas in the upper abdomen. IMPRESSION: Known right upper lobe lung nodule/mass. No new cardiopulmonary abnormality. Electronically Signed   By: Genevie Ann M.D.   On: 11/18/2020 06:45       I have independently reviewed the above radiology studies  and reviewed the findings with the patient.   Recent Lab Findings: Lab Results  Component Value Date   WBC 7.9 11/18/2020   HGB 12.4 (L) 11/18/2020   HCT 37.3 (L) 11/18/2020   PLT 205 11/18/2020   GLUCOSE 86 11/18/2020   CHOL 160 11/08/2019   TRIG 153 (H) 11/08/2019   HDL 40 (L) 11/08/2019   LDLCALC 89 11/08/2019   ALT 34 11/18/2020   AST 27 11/18/2020   NA 140 11/18/2020   K 3.5 11/18/2020   CL 113 (H) 11/18/2020   CREATININE 0.71 11/18/2020   BUN 17 11/18/2020   CO2 19 (L) 11/18/2020   TSH 1.319 02/07/2018   INR 0.99 10/18/2018   HGBA1C 5.0 11/10/2016     PFTs: - FVC: 69% - FEV1: 57% -DLCO: 89%  Problem List: 2 cm right upper lobe pulmonary nodule with SUV of 12.7 Mild hypermetabolism in the right hilum with SUV of 3.3 Mild hypermetabolism at the GE junction with SUV of 4.1.  Patient does have a history of eosinophilic esophagitis, and was noted to have grade a esophagitis at the GE junction in 2020.  Assessment / Plan:   65 year old male with a 2 cm right upper lobe pulmonary nodule very concerning for primary lung cancer.  I reviewed his current and past images, and cross-sectional imaging from 2021 did not show this nodule.  He additionally has some mild uptake in the right hilum and GE junction.  I think that the GE junction avidity is most likely related to his eosinophilic esophagitis, and this can be evaluated following treatment for this pulmonary nodule.  We discussed multiple options of obtaining a biopsy and we have agreed to proceed with a combination procedure with Dr. Valeta Harms in which she will perform a navigational bronchoscopy.  If this shows non-small cell lung cancer then we will proceed with a robotic  assisted right thoracoscopy and right upper lobectomy.  He is on Plavix and this will need to be held for 5 days prior to surgery.  He is tentatively scheduled for the next few weeks.     I  spent 40 minutes with  the patient face to face in counseling and coordination of care.    [name] 11/28/2020 1:35 PM

## 2020-11-28 NOTE — Progress Notes (Signed)
[address]       [place] [address]             [phone]                    [name] [facility] Medical Record #[id number] Date of Birth: [birth year]  Referring: Garner Nash, DO Primary Care: [name], MD Primary Cardiologist: Larae Grooms, MD  Chief Complaint:    Chief Complaint  Patient presents with  . Lung Lesion    Initial Surgical Consult, CT chest 2/25, PET yesterday, PFT 3/17    History of Present Illness:    [name] 65 y.o. male referred by Dr. Valeta Harms for surgical evaluation of a right upper lobe pulmonary nodule.  This was found incidentally when he was being worked up for chest pain.  He does have a history of coronary artery bypass grafting performed by Dr. Caffie Pinto in 2017.  Following that date he has been tobacco free.  He was infected with Covid late last year and had symptoms for over 2 months.  He continues to have some pleuritic chest pain and mental fog.    Smoking Hx: Quit in 2017   Zubrod Score: At the time of surgery this patient's most appropriate activity status/level should be described as: [x]     0    Normal activity, no symptoms []     1    Restricted in physical strenuous activity but ambulatory, able to do out light work []     2    Ambulatory and capable of self care, unable to do work activities, up and about               >50 % of waking hours                              []     3    Only limited self care, in bed greater than 50% of waking hours []     4    Completely disabled, no self care, confined to bed or chair []     5    Moribund   Past Medical History:  Diagnosis Date  . Anxiety   . Arthritis   . Basal cell carcinoma (BCC) of forehead   . CAD (coronary artery disease)    a. 10/2015 ant STEMI >> LHC with 3 v CAD; oLAD tx with POBA >> emergent CABG. b. Multiple evals since that time, early graft failure of SVG-RCA by cath 03/2016. c. 2/19 PCI/DES x1 to pRCA, normal EF.  .  Carotid artery disease ([place])    a. 40-59% BICA 02/2018.  Marland Kitchen Depression   . Dyspnea   . Ectopic atrial tachycardia ([place])   . Esophageal reflux    eosinophil esophagitis  . Family history of adverse reaction to anesthesia    "sister has PONV" (06/21/2017)  . Former tobacco use   . Gout   . Hepatitis C    "treated and cured" (06/21/2017)  . High cholesterol   . History of kidney stones   . Hypertension   . Ischemic cardiomyopathy    a. EF 25-30% at intraop TEE 4/17  //  b. Limited Echo 5/17 - EF 45-50%, mild ant HK. c. EF 55-65% by cath 09/2017.  . Migraine    "3-4/yr" (06/21/2017)  . Myocardial infarction ([place]) 10/2015  . Palpitations   . Sinus bradycardia  a. HR dropping into 40s in 02/2016 -> BB reduced.  . Stroke ([place]) 10/2016   "small one; sometimes my memory/cognitive issues" (06/21/2017)  . Symptomatic hypotension    a. 02/2016 ER visit -> meds reduced.  . Syncope     Past Surgical History:  Procedure Laterality Date  . ANTERIOR CERVICAL DECOMP/DISCECTOMY FUSION N/A 10/17/2018   Procedure: Anterior Cervical Decompression Fusion - Cervical seven -Thoracic one;  Surgeon: Consuella Lose, MD;  Location: [facility];  Service: Neurosurgery;  Laterality: N/A;  . BASAL CELL CARCINOMA EXCISION     "forehead  . BIOPSY  07/20/2019   Procedure: BIOPSY;  Surgeon: Carol Ada, MD;  Location: [facility];  Service: Endoscopy;;  . CARDIAC CATHETERIZATION N/A 11/28/2015   Procedure: Left Heart Cath and Coronary Angiography;  Surgeon: [name], MD;  Location: [facility];  Service: Cardiovascular;  Laterality: N/A;  . CARDIAC CATHETERIZATION N/A 11/28/2015   Procedure: Coronary Balloon Angioplasty;  Surgeon: [name], MD;  Location: [facility];  Service: Cardiovascular;  Laterality: N/A;  ostial LAD  . CARDIAC CATHETERIZATION N/A 11/28/2015   Procedure: Coronary/Graft Angiography;  Surgeon: [name], MD;  Location: [facility];  Service:  Cardiovascular;  Laterality: N/A;  coronaries only   . CARDIAC CATHETERIZATION N/A 04/21/2016   Procedure: Left Heart Cath and Coronary Angiography;  Surgeon: Wellington Hampshire, MD;  Location: [facility];  Service: Cardiovascular;  Laterality: N/A;  . CARDIAC CATHETERIZATION N/A 06/14/2016   Procedure: Left Heart Cath and Cors/Grafts Angiography;  Surgeon: Lorretta Harp, MD;  Location: [facility];  Service: Cardiovascular;  Laterality: N/A;  . CARDIAC CATHETERIZATION N/A 09/08/2016   Procedure: Left Heart Cath and Cors/Grafts Angiography;  Surgeon: Wellington Hampshire, MD;  Location: [facility];  Service: Cardiovascular;  Laterality: N/A;  . CARDIAC CATHETERIZATION    . CORONARY ARTERY BYPASS GRAFT N/A 11/28/2015   Procedure: CORONARY ARTERY BYPASS GRAFTING (CABG) TIMES FIVE USING LEFT INTERNAL MAMMARY ARTERY AND RIGHT GREATER SAPHENOUS,VIEN HARVEATED BY ENDOVIEN, INTRAOPPRATIVE TEE;  Surgeon: Gaye Pollack, MD;  Location: [facility];  Service: Open Heart Surgery;  Laterality: N/A;  . CORONARY STENT INTERVENTION N/A 10/05/2017   Procedure: CORONARY STENT INTERVENTION;  Surgeon: [name], MD;  Location: [facility];  Service: Cardiovascular;  Laterality: N/A;  . ESOPHAGOGASTRODUODENOSCOPY (EGD) WITH PROPOFOL N/A 07/20/2019   Procedure: ESOPHAGOGASTRODUODENOSCOPY (EGD) WITH PROPOFOL;  Surgeon: Carol Ada, MD;  [facility];  Service: Endoscopy;  Laterality: N/A;  . HUMERUS SURGERY Right 1969   "tumor inside bone; filled it w/bone chips"  . LEFT HEART CATH AND CORS/GRAFTS ANGIOGRAPHY N/A 03/11/2017   Procedure: Left Heart Cath and Cors/Grafts Angiography;  Surgeon: Leonie Man, MD;  Location: [facility];  Service: Cardiovascular;  Laterality: N/A;  . LEFT HEART CATH AND CORS/GRAFTS ANGIOGRAPHY N/A 10/05/2017   Procedure: LEFT HEART CATH AND CORS/GRAFTS ANGIOGRAPHY;  Surgeon: [name], MD;  Location: [facility];  Service:  Cardiovascular;  Laterality: N/A;  . LEFT HEART CATH AND CORS/GRAFTS ANGIOGRAPHY N/A 04/11/2019   Procedure: LEFT HEART CATH AND CORS/GRAFTS ANGIOGRAPHY;  Surgeon: [name], MD;  Location: [facility];  Service: Cardiovascular;  Laterality: N/A;  . PERIPHERAL VASCULAR CATHETERIZATION N/A 06/14/2016   Procedure: Lower Extremity Angiography;  Surgeon: Lorretta Harp, MD;  Location: [facility];  Service: Cardiovascular;  Laterality: N/A;    Family History  Problem  Relation Age of Onset  . Lung cancer Mother   . Heart Problems Father   . Heart attack Father 5  . Stroke Father   . Heart failure Father   . Heart attack Maternal Grandmother   . Stroke Maternal Grandmother   . Heart attack Paternal Uncle   . Hypertension Brother   . Autoimmune disease Neg Hx      Social History   Tobacco Use  Smoking Status Former Smoker  . Packs/day: 0.75  . Years: 44.00  . Pack years: 33.00  . Types: Cigarettes  . Quit date: 11/28/2015  . Years since quitting: 5.0  Smokeless Tobacco Never Used    Social History   Substance and Sexual Activity  Alcohol Use Yes   Comment: occ     Allergies  Allergen Reactions  . Prednisone Other (See Comments)    States that this med makes him "crazy"  . Tetanus Toxoids Swelling and Other (See Comments)    Fever, Swelling of the arm   . Wellbutrin [Bupropion] Other (See Comments)    Crazy thoughts, nightmares  . Morphine And Related Hives and Itching    Redness at the injection site  . Scallops [Shellfish Allergy] Nausea Only  . Chantix [Varenicline] Other (See Comments)    Dreams    Current Outpatient Medications  Medication Sig Dispense Refill  . amLODipine (NORVASC) 10 MG tablet TAKE 1 TABLET(10 MG) BY MOUTH DAILY 90 tablet 1  . aspirin 81 MG tablet Take 1 tablet (81 mg total) by mouth daily. 30 tablet   . atorvastatin (LIPITOR) 80 MG tablet TAKE 1 TABLET(80 MG) BY MOUTH DAILY 90 tablet 2  . chlordiazePOXIDE  (LIBRIUM) 10 MG capsule Take 10-20 mg by mouth 3 (three) times daily as needed for anxiety.     . cloNIDine (CATAPRES) 0.1 MG tablet Take 0.1 mg by mouth at bedtime.    . clopidogrel (PLAVIX) 75 MG tablet TAKE 1 TABLET BY MOUTH EVERY DAY 90 tablet 1  . lisinopril (ZESTRIL) 5 MG tablet TAKE 1 TABLET BY MOUTH DAILY. MAY TAKE ADDITIONAL TABLET ONCE DAILY FOR SYSTOLIC BLOOD PRESSURE GREATER THAN 150 ONE HOUR AFTER TAKING DAILY DOSE 135 tablet 3  . metoprolol succinate (TOPROL-XL) 25 MG 24 hr tablet Take 1 tablet (25 mg total) by mouth daily. 90 tablet 3  . Multiple Vitamins-Minerals (ONE-A-DAY MENS 50+ ADVANTAGE PO) Take 1 tablet by mouth daily.     . nitroGLYCERIN (NITROSTAT) 0.4 MG SL tablet PLACE 1 TABLET UNDER THE TONGUE EVERY 5 MINUTES AS NEEDED FOR CHEST PAIN. 3 DOSES MAX 25 tablet 4  . ranolazine (RANEXA) 1000 MG SR tablet Take 1 tablet (1,000 mg total) by mouth 2 (two) times daily. 180 tablet 0  . acetaminophen (TYLENOL) 500 MG tablet Take 1,000 mg by mouth every 6 (six) hours as needed for mild pain or headache.  (Patient not taking: Reported on 11/28/2020)    . oxyCODONE-acetaminophen (PERCOCET/ROXICET) 5-325 MG tablet Take 1 tablet by mouth every 6 (six) hours as needed. (Patient not taking: Reported on 11/28/2020)    . pantoprazole (PROTONIX) 40 MG tablet Take 40 mg by mouth every evening.  (Patient not taking: Reported on 11/28/2020)    . Vilazodone HCl (VIIBRYD) 10 MG TABS 1 tablet with food (Patient not taking: Reported on 11/28/2020)     No current facility-administered medications for this visit.    Review of Systems  Constitutional: Negative for malaise/fatigue and weight loss.  Respiratory: Positive for shortness of breath.  Cardiovascular: Positive for chest pain.  Psychiatric/Behavioral: The patient is nervous/anxious.      PHYSICAL EXAMINATION: BP (!) 146/90 (BP Location: Left Arm, Patient Position: Sitting)   Pulse 82   Resp 20   Ht 5\' 9"  (1.753 m)   Wt 200 lb (90.7 kg)    SpO2 98% Comment: RA  BMI 29.53 kg/m  Physical Exam Constitutional:      General: He is not in acute distress.    Appearance: Normal appearance. He is normal weight. He is not ill-appearing or toxic-appearing.  HENT:     Head: Normocephalic and atraumatic.  Eyes:     Extraocular Movements: Extraocular movements intact.  Cardiovascular:     Rate and Rhythm: Normal rate.  Pulmonary:     Effort: Pulmonary effort is normal. No respiratory distress.  Abdominal:     General: Abdomen is flat. There is no distension.  Musculoskeletal:        General: Normal range of motion.     Cervical back: Normal range of motion.  Skin:    General: Skin is warm and dry.  Neurological:     General: No focal deficit present.     Mental Status: He is alert and oriented to person, place, and time.     Diagnostic Studies & Laboratory data:     Recent Radiology Findings:   CT Abdomen Pelvis W Contrast  Result Date: 11/18/2020 CLINICAL DATA:  65 year old male with hepatitis C. Epigastric and abdominal pain radiating to the back since 0300 hours. Suspicious right upper lobe lung mass/nodule on CTA last month. EXAM: CT ABDOMEN AND PELVIS WITH CONTRAST TECHNIQUE: Multidetector CT imaging of the abdomen and pelvis was performed using the standard protocol following bolus administration of intravenous contrast. CONTRAST:  130mL OMNIPAQUE IOHEXOL 300 MG/ML  SOLN COMPARISON:  Chest CTA 10/24/2020. Noncontrast CT Abdomen and Pelvis 05/27/2020. CT Abdomen and Pelvis with contrast 07/17/2019. FINDINGS: Lower chest: Lung bases are stable and negative. No pericardial or pleural effusion. Hepatobiliary: Stable liver since 2020, with evidence of mild hepatic steatosis and a punctate low-density area at the dome which is likely a tiny benign cyst. Negative gallbladder. No bile duct enlargement. Pancreas: Negative. Spleen: Negative.  No splenomegaly. Adrenals/Urinary Tract: Normal adrenal glands. Kidneys appears stable since  2020 and negative aside from punctate right upper pole nephrolithiasis. Small chronic left renal upper pole cyst. No nephrolithiasis. Symmetric renal contrast excretion. Negative ureters and bladder. Stomach/Bowel: Mild diverticulosis in the sigmoid colon. Otherwise negative large bowel. Normal appendix on series 2, image 57. Negative terminal ileum. No dilated small bowel. Decompressed stomach. No free air, free fluid, mesenteric inflammation. Vascular/Lymphatic: Aortoiliac calcified atherosclerosis. Major arterial structures are patent. Portal venous system is patent. No lymphadenopathy. Reproductive: Negative. Other: No pelvic free fluid. Musculoskeletal: Chronic or congenital left L5 posterior element defect. No acute or suspicious osseous lesion. IMPRESSION: 1. No acute, inflammatory, or metastatic process in the abdomen or pelvis. 2. Mild chronic hepatic steatosis. Punctate right nephrolithiasis. Mild sigmoid diverticulosis. Aortic Atherosclerosis (ICD10-I70.0). Electronically Signed   By: Genevie Ann M.D.   On: 11/18/2020 07:52   NM PET Image Initial (PI) Skull Base To Thigh  Result Date: 11/27/2020 CLINICAL DATA:  Initial treatment strategy for lung nodule. EXAM: NUCLEAR MEDICINE PET SKULL BASE TO THIGH TECHNIQUE: 9.81 mCi F-18 FDG was injected intravenously. Full-ring PET imaging was performed from the skull base to thigh after the radiotracer. CT data was obtained and used for attenuation correction and anatomic localization. Fasting blood glucose: 105 mg/dl  COMPARISON:  October 24, 2020 FINDINGS: Mediastinal blood pool activity: SUV max 2.30 Liver activity: SUV max NA NECK: There is misregistration artifact on the current PET. Native PET images do show mildly asymmetric PET activity in the RIGHT oro pharyngeal tonsillar tissue as compared to the LEFT. CT images show mild asymmetry of soft tissue also on the RIGHT as compared to the LEFT best exhibited on image 25 of series 4. Maximum SUV in this area is  approximately 6.8 as compared to 5.8 on the contralateral side. There is a suggestion of either secretions overlying this area or some ulceration on image 25 of series 4 as well. Incidental CT findings: none CHEST: RIGHT upper lobe nodule (image 27, series 8) approximately 2 x 1.9 cm, measurements difficult in terms of exact a mentions due to respiratory motion. Maximum SUV, markedly hypermetabolic with 33.2 max SUV. Mild increased metabolism about the RIGHT hilum max SUV of 3.3 on image 71 of series 4. Difficult to localize discrete nodal tissue in this location. There was some mildly prominent RIGHT hilar nodal tissue on the previous CT examination. Incidental CT findings: No consolidation. No pleural effusion. Signs of median sternotomy for coronary revascularization. Focal uptake at the GE junction is mild-to-moderate with a maximum SUV of 4.1. ABDOMEN/PELVIS: No abnormal hypermetabolic activity within the liver, pancreas, adrenal glands, or spleen. No hypermetabolic lymph nodes in the abdomen or pelvis. Incidental CT findings: Liver, gallbladder, pancreas, spleen, adrenal glands and kidneys without acute process. Smooth contour of the urinary bladder. No acute gastrointestinal findings. Normal appendix. Colonic diverticulosis. Aortic atherosclerosis without aneurysmal dilation of the abdominal aorta. Scattered small lymph nodes throughout the retroperitoneum none with hypermetabolic features. SKELETON: No focal hypermetabolic activity to suggest skeletal metastasis. Incidental CT findings: Signs of median sternotomy. Signs of anterior cervical discectomy and fusion at the cervicothoracic junction. Spinal degenerative changes throughout the spine. Areas of lucency with a central area of ground-glass attenuation in the RIGHT proximal humerus, present in hindsight as far back as 2020 with narrow zone of transition, also seen on chest x-ray in 2007. Mild asymmetric uptake about the RIGHT shoulder is favored to  represent muscular activity. Correlate with any pain in this location. IMPRESSION: RIGHT upper lobe nodule with marked hypermetabolic features and moderate hypermetabolic lymph node in the RIGHT hilum suspicious for primary bronchogenic neoplasm with potential involvement of RIGHT hilar lymph nodes. Focal activity at the GE junction without discernible mass. There is some mild soft tissue fullness in this area. Consider esophagoscopy if not recently performed for further evaluation, to exclude small neoplasm. Asymmetry of the RIGHT oropharynx, question of ulcerative changes with mildly asymmetric uptake, direct visualization or CT of the neck may be helpful for further assessment. Benign-appearing bone lesion in the RIGHT proximal humerus potentially aneurysmal bone cyst, present as far back as 2007. Consider dedicated humeral evaluation as warranted, particularly if there are symptoms of pain in this location on follow-up. There is some mildly asymmetric FDG uptake outside of the bone within musculature about the RIGHT shoulder. Correlate with any pain in this location. These results will be called to the ordering clinician or representative by the Radiologist Assistant, and communication documented in the PACS or [employer]. Electronically Signed   By: Zetta Bills M.D.   On: 11/27/2020 14:29   DG Chest Portable 1 View  Result Date: 11/18/2020 CLINICAL DATA:  65 year old male with epigastric pain radiating to the back. Suspected right upper lobe lung cancer on chest CTA last month. EXAM: PORTABLE  CHEST 1 VIEW COMPARISON:  Chest CTA 10/24/2020 and earlier. FINDINGS: Portable AP view at 0624 hours. Lordotic positioning today. Right upper lobe lung nodule projects over the posterior 6th rib on this image. Mediastinal contours remain normal. Prior sternotomy. No pneumothorax, pulmonary edema, pleural effusion or new pulmonary opacity. Stable visualized osseous structures. Lower cervical ACDF. Paucity of  bowel gas in the upper abdomen. IMPRESSION: Known right upper lobe lung nodule/mass. No new cardiopulmonary abnormality. Electronically Signed   By: Genevie Ann M.D.   On: 11/18/2020 06:45       I have independently reviewed the above radiology studies  and reviewed the findings with the patient.   Recent Lab Findings: Lab Results  Component Value Date   WBC 7.9 11/18/2020   HGB 12.4 (L) 11/18/2020   HCT 37.3 (L) 11/18/2020   PLT 205 11/18/2020   GLUCOSE 86 11/18/2020   CHOL 160 11/08/2019   TRIG 153 (H) 11/08/2019   HDL 40 (L) 11/08/2019   LDLCALC 89 11/08/2019   ALT 34 11/18/2020   AST 27 11/18/2020   NA 140 11/18/2020   K 3.5 11/18/2020   CL 113 (H) 11/18/2020   CREATININE 0.71 11/18/2020   BUN 17 11/18/2020   CO2 19 (L) 11/18/2020   TSH 1.319 02/07/2018   INR 0.99 10/18/2018   HGBA1C 5.0 11/10/2016     PFTs: - FVC: 69% - FEV1: 57% -DLCO: 89%  Problem List: 2 cm right upper lobe pulmonary nodule with SUV of 12.7 Mild hypermetabolism in the right hilum with SUV of 3.3 Mild hypermetabolism at the GE junction with SUV of 4.1.  Patient does have a history of eosinophilic esophagitis, and was noted to have grade a esophagitis at the GE junction in 2020.  Assessment / Plan:   65 year old male with a 2 cm right upper lobe pulmonary nodule very concerning for primary lung cancer.  I reviewed his current and past images, and cross-sectional imaging from 2021 did not show this nodule.  He additionally has some mild uptake in the right hilum and GE junction.  I think that the GE junction avidity is most likely related to his eosinophilic esophagitis, and this can be evaluated following treatment for this pulmonary nodule.  We discussed multiple options of obtaining a biopsy and we have agreed to proceed with a combination procedure with Dr. Valeta Harms in which she will perform a navigational bronchoscopy.  If this shows non-small cell lung cancer then we will proceed with a robotic  assisted right thoracoscopy and right upper lobectomy.  He is on Plavix and this will need to be held for 5 days prior to surgery.  He is tentatively scheduled for the next few weeks.     I  spent 40 minutes with  the patient face to face in counseling and coordination of care.    [name] 11/28/2020 1:35 PM

## 2020-12-01 ENCOUNTER — Other Ambulatory Visit: Payer: Self-pay | Admitting: Interventional Cardiology

## 2020-12-01 ENCOUNTER — Telehealth: Payer: Self-pay | Admitting: Pulmonary Disease

## 2020-12-01 NOTE — Telephone Encounter (Signed)
ENB/EBUS was already scheduled for 4/27 at 7:30.  Called pt - he was aware of date but not time.  Covid test was already scheduled for 4/25.  I scheduled Ct for 4/15 at [facility].  Spoke to [place] & she is going to have disk sent to [facility].

## 2020-12-06 IMAGING — DX PORTABLE CHEST - 1 VIEW
2 series · 2 of 2 positions shown · non-contrast
Comparison: Chest x-ray dated 09/28/2018.

CLINICAL DATA: Cough

EXAM:
PORTABLE CHEST 1 VIEW

[chest ap (1 of 2)]
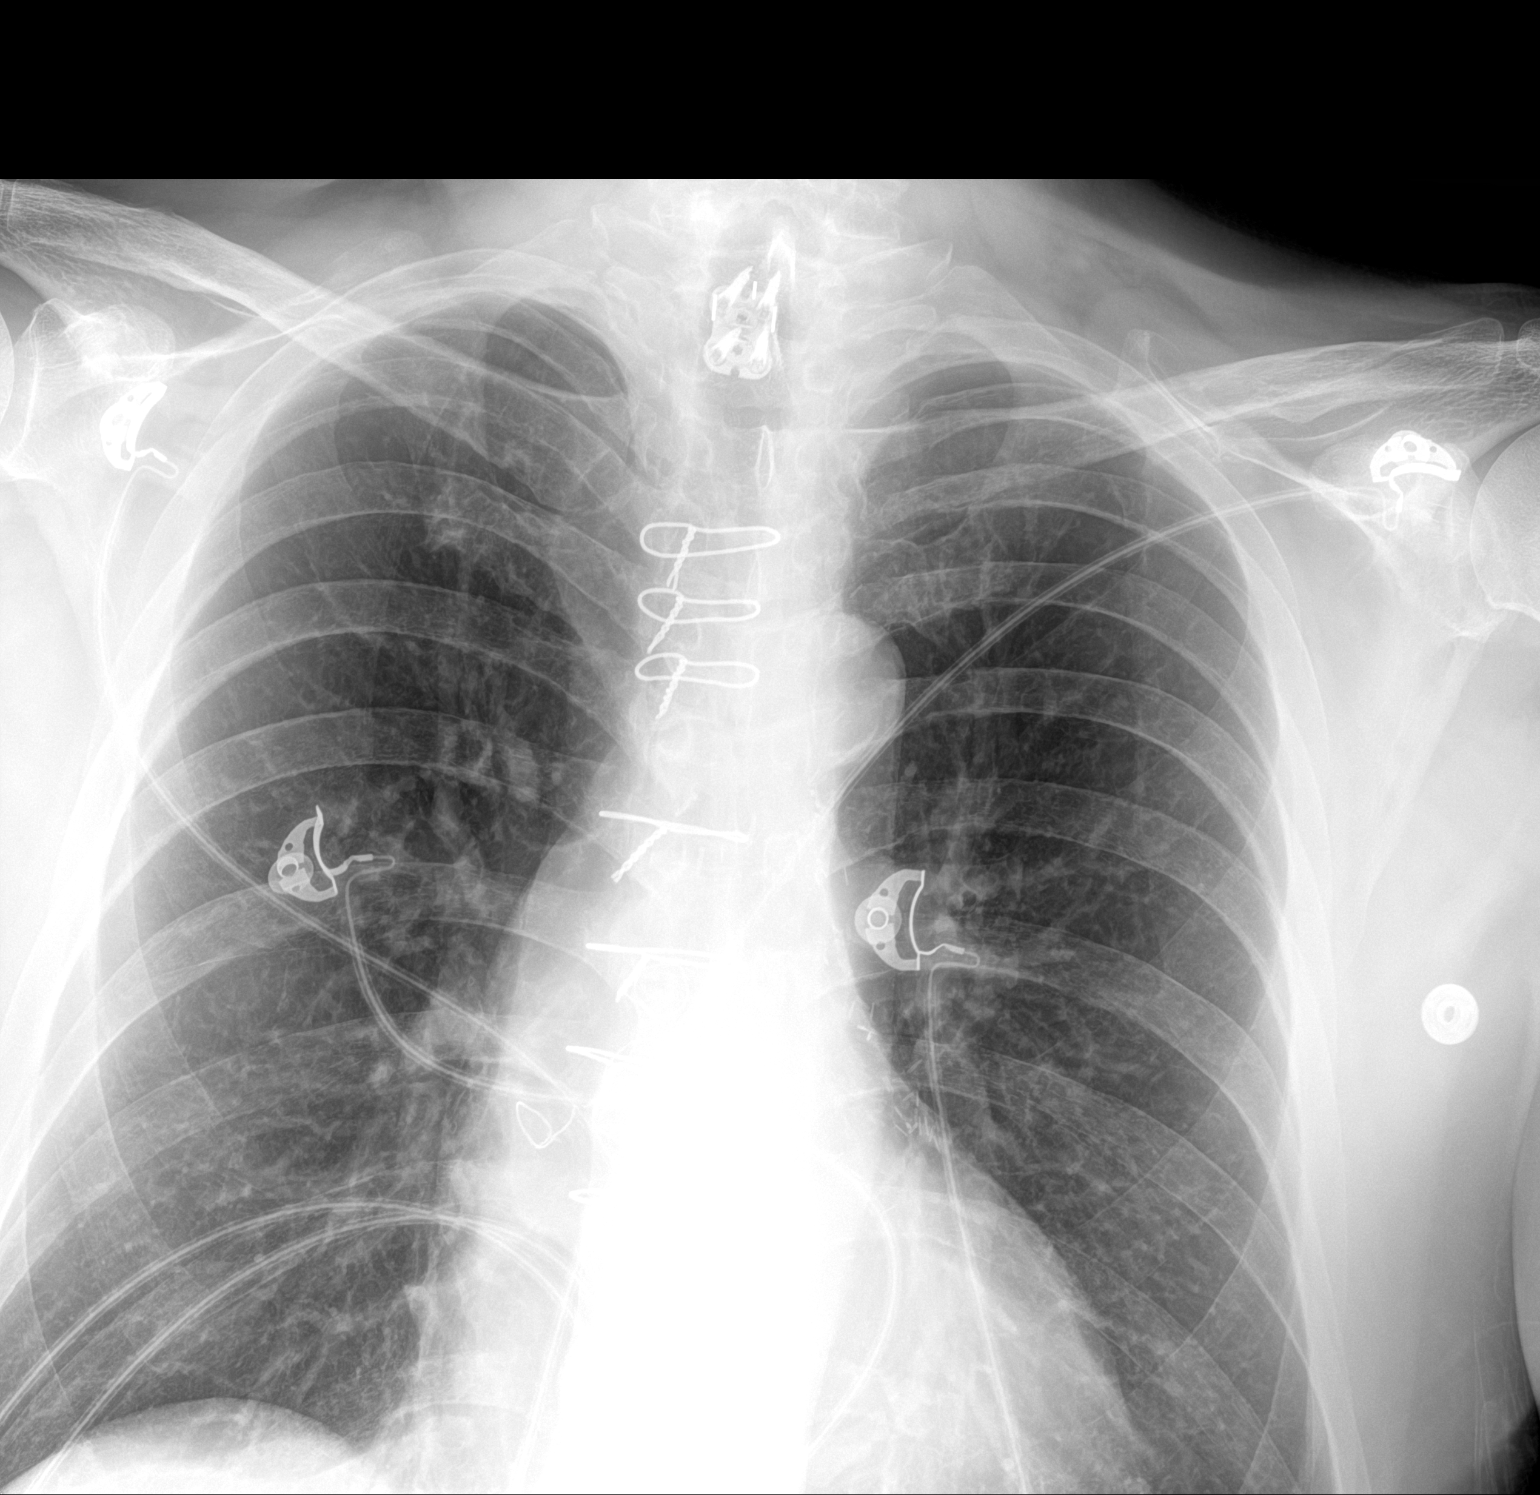

[chest ap (2 of 2)]
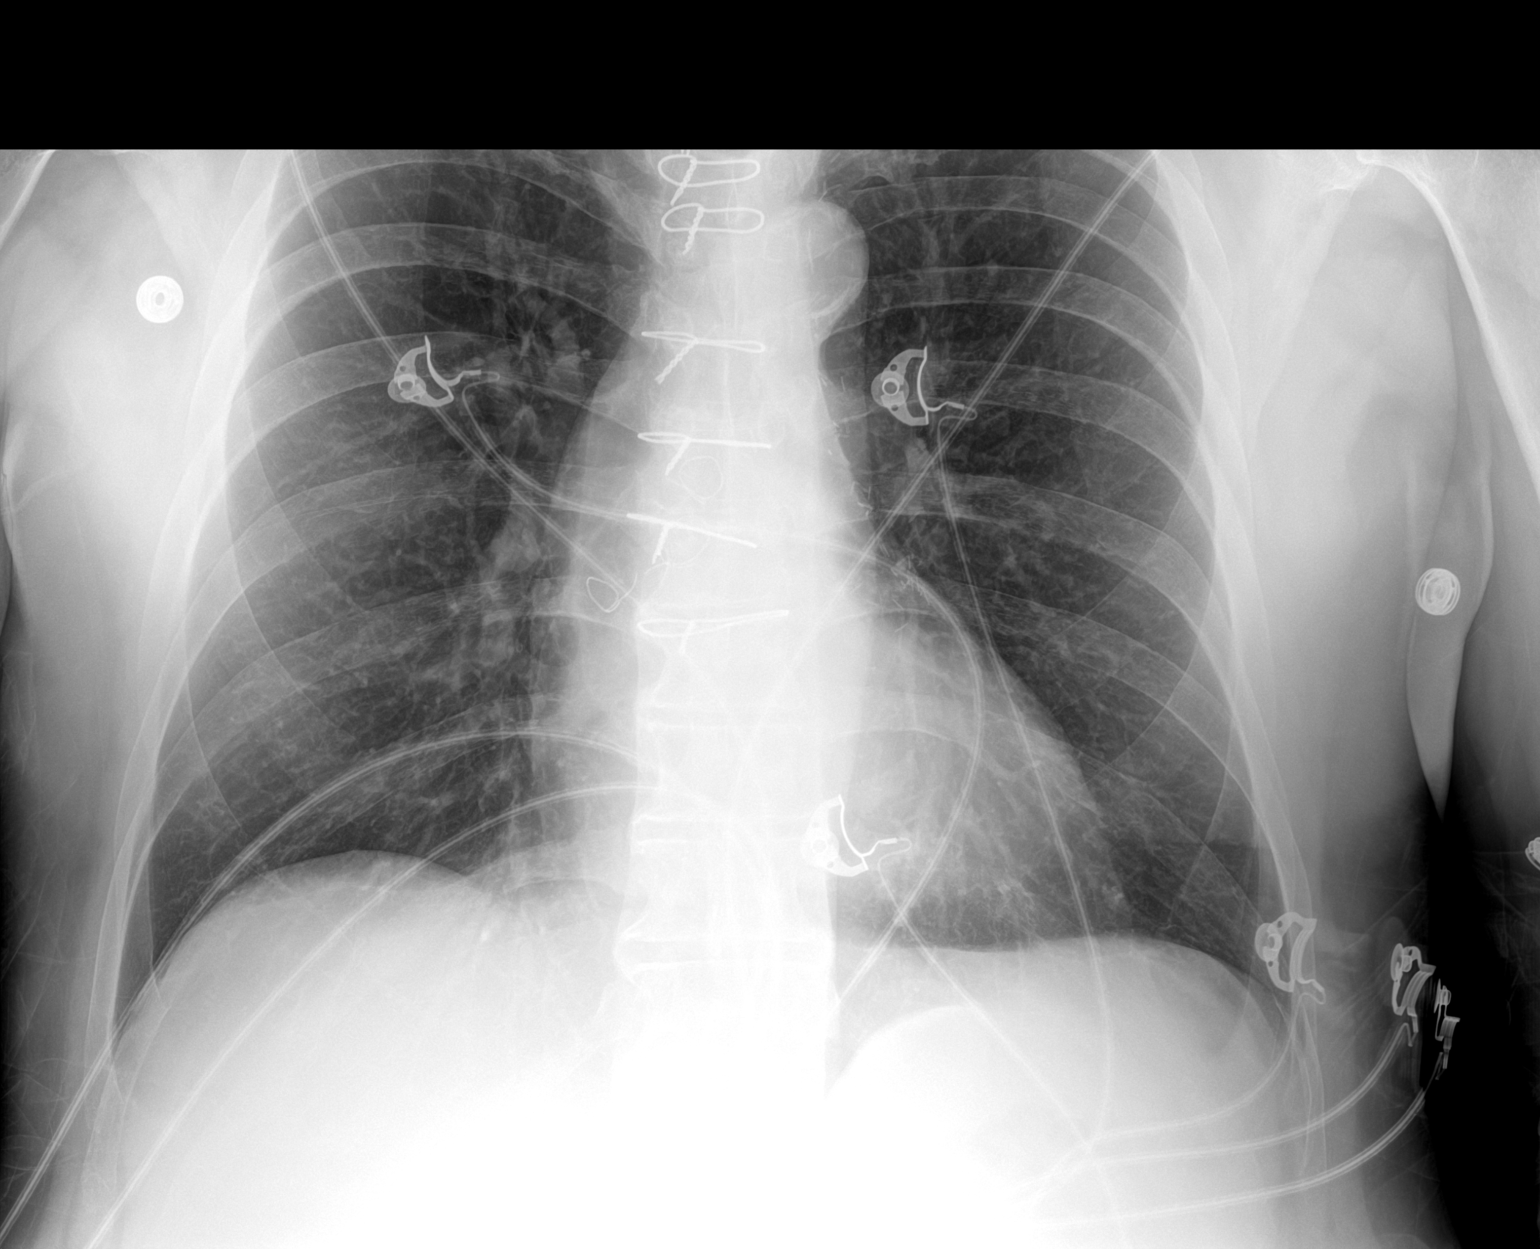

[2 of 2 positions shown; findings below may reference images not displayed]

FINDINGS: The heart size is mildly enlarged. The patient is status post prior
median sternotomy. The patient is status post prior ACDF of the
lower cervical spine. There is no fracture. There is no
pneumothorax. There is no large focal area of consolidation. No
large pleural effusion. There are few stable dense retrocardiac
nodular opacities.
IMPRESSION: No active disease.

## 2020-12-11 ENCOUNTER — Telehealth: Payer: Self-pay | Admitting: Pulmonary Disease

## 2020-12-11 NOTE — Telephone Encounter (Signed)
Called and spoke with patient. Patient states he is having some pain when breathing in on the Right lower side of chest near the sternum. Reports onset was on 2/25 when the lung nodule was found. Reports pain is 4-5, achy, pressure pain. States pain has been ongoing since finding the nodule but today it is more pronounced. States he has a slight headache today, 2 on pain scale. Pain is near the back of head, feels achy. He denies any congestion. States he is feeling very fatigue today. Has not been able to do much at work. States he fell asleep around 730 last night, states he usually is in bed by 11-12 pm. He states he had his vitals taking today. BP: 121/71, Pulse: 80 and O2 99%  TP please advise.  Thanks

## 2020-12-11 NOTE — Telephone Encounter (Signed)
PCCM:  If he gets worse he should be seen in clinic with a cxr and EKG. His vitals are reassuring. Alternatively needs to be seen in the ED or Urgent care after hours if he gets worse. His lung nodule should not be causing pain in his chest.   Garner Nash, DO [facility] 12/11/2020 3:17 PM

## 2020-12-11 NOTE — Telephone Encounter (Signed)
Called and spoke with patient. Advised him of Icard's recs. Patient states he will keep an eye on it and if he gets worse he will go to the ED or Urgent care.   Nothing further needed at this time.

## 2020-12-11 NOTE — Telephone Encounter (Signed)
Per TP, routing to BI.

## 2020-12-12 ENCOUNTER — Ambulatory Visit (HOSPITAL_COMMUNITY)
Admission: RE | Admit: 2020-12-12 | Discharge: 2020-12-12 | Disposition: A | Discharge status: Home / Self Care | Payer: Medicare Other | Source: Ambulatory Visit | Attending: Pulmonary Disease | Admitting: Pulmonary Disease

## 2020-12-12 ENCOUNTER — Other Ambulatory Visit: Payer: Self-pay

## 2020-12-12 DIAGNOSIS — R911 Solitary pulmonary nodule: Secondary | ICD-10-CM

## 2020-12-18 ENCOUNTER — Telehealth: Payer: Self-pay | Admitting: Interventional Cardiology

## 2020-12-18 NOTE — Telephone Encounter (Signed)
Pt calling to report a continued elevation in HR when ambulating in the low 100's and elevation of BP in the 140's/80's. He states he is not symptomatic with this, but he is aware of it. He knows he can take an additional 25mg  of metoprolol succinate if needed. He states he is going to start taking 50mg  qd to see if this helps his HR and BP.   He would also like Dr. Irish Lack to know he is off his plavix due to an upcoming procedure.   I will forward to MD for review.

## 2020-12-18 NOTE — Telephone Encounter (Signed)
Pt c/o BP issue: STAT if pt c/o blurred vision, one-sided weakness or slurred speech  1. What are your last 5 BP readings?  Only has one 140/90 HR 110 to 120  2. Are you having any other symptoms (ex. Dizziness, headache, blurred vision, passed out)? NO   3. What is your BP issue? Pt states that when he is up moving around his BP is elevated along with his HR.PT is stating he is getting a procedure done on 4/27 and has been off his blood thinner medication.PTt wants to know if it is posssible to change his medication ,PLease advise   Patient c/o Palpitations:  High priority if patient c/o lightheadedness, shortness of breath, or chest pain  1) How long have you had palpitations/irregular HR/ Afib? Are you having the symptoms now? Since he has had Covid  2) Are you currently experiencing lightheadedness, SOB or CP? No   3) Do you have a history of afib (atrial fibrillation) or irregular heart rhythm? No   4) Have you checked your BP or HR? (document readings if available): 140/90 HR 110 to 120  5) Are you experiencing any other symptoms? No

## 2020-12-18 NOTE — Telephone Encounter (Signed)
OK to take higher dose of metoprolol.  OK to be off of plavix short term for a procedure.  JV

## 2020-12-19 NOTE — Progress Notes (Addendum)
Surgical Instructions    Your procedure is scheduled on Wednesday April 27,2022  Report to [facility] Main Entrance "A" at 5:30 A.M., then check in with the Admitting office.  Call this number if you have problems the morning of surgery:  [phone]   If you have any questions prior to your surgery date call [phone]: Open Monday-Friday 8am-4pm    Remember:  Do not eat after midnight the night before your surgery     Take these medicines the morning of surgery with A SIP OF WATER :                                             Amlodipine(Norvasc)              Atorvastatin(Lipitor)              Metoprolol succinate (Toprol-XL)              Ranolazine(Ranexa)               You may take these medications if needed:              Acetaminophen(Tylenol)              Chloradiazepoxide(Librium)              Fluticasone(Flonase)              Nitrogylcerin(Nitrostat)   As of today, STOP taking anyAleve, Naproxen, Ibuprofen, Motrin, Advil, Goody's, BC's, all herbal medications, fish oil, and all vitamins.                                                       Last dose of Plavix should have been 12/19/2020 per Dr. Abran Duke instructions. Continue Aspirin until day of surgery. Do NOT take aspirin on day of surgery.                       Do not wear jewelry, make up, or nail polish            Do not wear lotions, powders, perfumes/colognes, or deodorant.            Do not shave 48 hours prior to surgery.  Men may shave face and neck.            Do not bring valuables to the hospital.            [facility] is not responsible for any belongings or valuables.  Do NOT Smoke (Tobacco/Vaping) or drink Alcohol 24 hours prior to your procedure If you use a CPAP at night, you may bring all equipment for your overnight stay.   Contacts, glasses, dentures or bridgework may not be worn into surgery, please bring cases for these belongings   For patients admitted to the hospital, discharge time  will be determined by your treatment team.   Patients discharged the day of surgery will not be allowed to drive home, and someone needs to stay with them for 24 hours.    Special instructions:   - Preparing For Surgery  Before surgery, you can play an important role. Because skin is not sterile, your skin needs to be  as free of germs as possible. You can reduce the number of germs on your skin by washing with CHG (chlorahexidine gluconate) Soap before surgery.  CHG is an antiseptic cleaner which kills germs and bonds with the skin to continue killing germs even after washing.    Oral Hygiene is also important to reduce your risk of infection.  Remember - BRUSH YOUR TEETH THE MORNING OF SURGERY WITH YOUR REGULAR TOOTHPASTE  Please do not use if you have an allergy to CHG or antibacterial soaps. If your skin becomes reddened/irritated stop using the CHG.  Do not shave (including legs and underarms) for at least 48 hours prior to first CHG shower. It is OK to shave your face.  Please follow these instructions carefully.   1. Shower the NIGHT BEFORE SURGERY and the MORNING OF SURGERY  2. If you chose to wash your hair, wash your hair first as usual with your normal shampoo.  3. After you shampoo, rinse your hair and body thoroughly to remove the shampoo.  4. Wash Face and genitals (private parts) with your normal soap.   5.  Shower the NIGHT BEFORE SURGERY and the MORNING OF SURGERY with CHG Soap.   6. Use CHG Soap as you would any other liquid soap. You can apply CHG directly to the skin and wash gently with a scrungie or a clean washcloth.   7. Apply the CHG Soap to your body ONLY FROM THE NECK DOWN.  Do not use on open wounds or open sores. Avoid contact with your eyes, ears, mouth and genitals (private parts). Wash Face and genitals (private parts)  with your normal soap.   8. Wash thoroughly, paying special attention to the area where your surgery will be  performed.  9. Thoroughly rinse your body with warm water from the neck down.  10. DO NOT shower/wash with your normal soap after using and rinsing off the CHG Soap.  11. Pat yourself dry with a CLEAN TOWEL.  12. Wear CLEAN PAJAMAS to bed the night before surgery  13. Place CLEAN SHEETS on your bed the night before your surgery  14. DO NOT SLEEP WITH PETS.   Day of Surgery: Take a shower with CHG soap. Wear Clean/Comfortable clothing the morning of surgery Do not apply any deodorants/lotions.   Remember to brush your teeth WITH YOUR REGULAR TOOTHPASTE.   Please read over the following fact sheets that you were given.

## 2020-12-19 NOTE — Telephone Encounter (Signed)
Pt made aware. Pt will update office within next week/two on how he is doing on increased dose.

## 2020-12-22 ENCOUNTER — Ambulatory Visit (HOSPITAL_COMMUNITY)
Admission: RE | Admit: 2020-12-22 | Discharge: 2020-12-22 | Disposition: A | Discharge status: Home / Self Care | Payer: Medicare Other | Source: Ambulatory Visit | Attending: Thoracic Surgery (Cardiothoracic Vascular Surgery) | Admitting: Thoracic Surgery (Cardiothoracic Vascular Surgery)

## 2020-12-22 ENCOUNTER — Other Ambulatory Visit: Payer: Self-pay

## 2020-12-22 ENCOUNTER — Other Ambulatory Visit (HOSPITAL_COMMUNITY)
Admission: RE | Admit: 2020-12-22 | Discharge: 2020-12-22 | Disposition: A | Discharge status: Home / Self Care | Payer: Medicare Other | Source: Ambulatory Visit | Attending: Pulmonary Disease | Admitting: Pulmonary Disease

## 2020-12-22 ENCOUNTER — Encounter (HOSPITAL_COMMUNITY)
Admission: RE | Admit: 2020-12-22 | Discharge: 2020-12-22 | Disposition: A | Discharge status: Home / Self Care | Payer: Medicare Other | Source: Ambulatory Visit | Attending: Pulmonary Disease | Admitting: Pulmonary Disease

## 2020-12-22 ENCOUNTER — Encounter (HOSPITAL_COMMUNITY): Payer: Self-pay

## 2020-12-22 DIAGNOSIS — Z01818 Encounter for other preprocedural examination: Secondary | ICD-10-CM

## 2020-12-22 DIAGNOSIS — Z20822 Contact with and (suspected) exposure to covid-19: Secondary | ICD-10-CM | POA: Insufficient documentation

## 2020-12-22 DIAGNOSIS — R911 Solitary pulmonary nodule: Secondary | ICD-10-CM | POA: Insufficient documentation

## 2020-12-22 DIAGNOSIS — I700 Atherosclerosis of aorta: Secondary | ICD-10-CM | POA: Insufficient documentation

## 2020-12-22 LAB — PROTIME-INR
INR: 1 (ref 0.8–1.2)
Prothrombin Time: 13.3 seconds (ref 11.4–15.2)

## 2020-12-22 LAB — URINALYSIS, ROUTINE W REFLEX MICROSCOPIC
Bacteria, UA: NONE SEEN
Bilirubin Urine: NEGATIVE
Glucose, UA: NEGATIVE mg/dL
Hgb urine dipstick: NEGATIVE
Ketones, ur: NEGATIVE mg/dL
Nitrite: NEGATIVE
Protein, ur: NEGATIVE mg/dL
Specific Gravity, Urine: 1.027 (ref 1.005–1.030)
pH: 5 (ref 5.0–8.0)

## 2020-12-22 LAB — CBC
HCT: 45.1 % (ref 39.0–52.0)
Hemoglobin: 14.9 g/dL (ref 13.0–17.0)
MCH: 30 pg (ref 26.0–34.0)
MCHC: 33 g/dL (ref 30.0–36.0)
MCV: 90.7 fL (ref 80.0–100.0)
Platelets: 272 10*3/uL (ref 150–400)
RBC: 4.97 MIL/uL (ref 4.22–5.81)
RDW: 12.8 % (ref 11.5–15.5)
WBC: 12.1 10*3/uL — ABNORMAL HIGH (ref 4.0–10.5)
nRBC: 0 % (ref 0.0–0.2)

## 2020-12-22 LAB — COMPREHENSIVE METABOLIC PANEL
ALT: 32 U/L (ref 0–44)
AST: 24 U/L (ref 15–41)
Albumin: 4.5 g/dL (ref 3.5–5.0)
Alkaline Phosphatase: 66 U/L (ref 38–126)
Anion gap: 9 (ref 5–15)
BUN: 21 mg/dL (ref 8–23)
CO2: 21 mmol/L — ABNORMAL LOW (ref 22–32)
Calcium: 9.5 mg/dL (ref 8.9–10.3)
Chloride: 105 mmol/L (ref 98–111)
Creatinine, Ser: 1.13 mg/dL (ref 0.61–1.24)
GFR, Estimated: >60 mL/min (ref >60)
Glucose, Bld: 105 mg/dL — ABNORMAL HIGH (ref 70–99)
Potassium: 4.1 mmol/L (ref 3.5–5.1)
Sodium: 135 mmol/L (ref 135–145)
Total Bilirubin: 1.1 mg/dL (ref 0.3–1.2)
Total Protein: 8 g/dL (ref 6.5–8.1)

## 2020-12-22 LAB — BLOOD GAS, ARTERIAL
Acid-base deficit: 2.7 mmol/L — ABNORMAL HIGH (ref 0.0–2.0)
Bicarbonate: 21.3 mmol/L (ref 20.0–28.0)
FIO2: 21
O2 Saturation: 97 %
Patient temperature: 37
pCO2 arterial: 34.8 mmHg (ref 32.0–48.0)
pH, Arterial: 7.403 (ref 7.350–7.450)
pO2, Arterial: 88.8 mmHg (ref 83.0–108.0)

## 2020-12-22 LAB — TYPE AND SCREEN
ABO/RH(D): O POS
Antibody Screen: NEGATIVE

## 2020-12-22 LAB — SURGICAL PCR SCREEN
MRSA, PCR: NEGATIVE
Staphylococcus aureus: POSITIVE — AB

## 2020-12-22 LAB — APTT: aPTT: 51 seconds — ABNORMAL HIGH (ref 24–36)

## 2020-12-22 NOTE — Progress Notes (Signed)
PCP - Majel Homer Cardiologist - Dr.  Lack  PPM/ICD -denies    Chest x-ray - 12/22/2020 EKG - 12/22/2020 Stress Test - 01/13/17 ECHO - 09/28/2018 Cardiac Cath - 04/11/2019  Sleep Study - denies  Fasting Blood Sugar - n/a Checks Blood Sugar _na____ times a day  Blood Thinner Instructions:Plavix last dose 12/18/20 Aspirin Instructions:continue. Hold day of surgery.  ERAS Protcol -na PRE-SURGERY Ensure or G2-na   COVID TEST-12/22/2020   Anesthesia review:yes for Cardiac history.   Patient denies shortness of breath, fever, cough and chest pain at PAT appointment   All instructions explained to the patient, with a verbal understanding of the material. Patient agrees to go over the instructions while at home for a better understanding. Patient also instructed to self quarantine after being tested for COVID-19. The opportunity to ask questions was provided.

## 2020-12-23 ENCOUNTER — Telehealth: Payer: Self-pay | Admitting: Pulmonary Disease

## 2020-12-23 LAB — SARS CORONAVIRUS 2 (TAT 6-24 HRS): SARS Coronavirus 2: NEGATIVE

## 2020-12-23 NOTE — Anesthesia Preprocedure Evaluation (Addendum)
Anesthesia Evaluation  Patient identified by MRN, date of birth, ID band Patient awake    Reviewed: Allergy & Precautions, NPO status , Patient's Chart, lab work & pertinent test results, reviewed documented beta blocker date and time   Airway Mallampati: II  TM Distance: >3 FB Neck ROM: Full    Dental no notable dental hx.    Pulmonary former smoker,  Lung nodule Former smoker, 33 pack year history, quit 2017   Pulmonary exam normal breath sounds clear to auscultation       Cardiovascular hypertension, Pt. on medications and Pt. on home beta blockers + CAD, + Past MI, + Cardiac Stents and + CABG (2017 CABG x 5)  Normal cardiovascular exam Rhythm:Regular Rate:Normal  Echo 2020: 1. The left ventricle has normal systolic function of 35-45%. The cavity  size is normal. There is normal left ventricular hypertrophy. Echo  evidence of normal diastolic filling patterns.  2. Normal left atrial size.  3. Normal right atrial size.  4. Normal tricuspid valve.  5. Tricuspid regurgitation is mild.  6. No atrial level shunt detected by color flow Doppler.   Cath 2020:  Mid LAD lesion is 55% stenosed.  Ost LAD to Prox LAD lesion is 50% stenosed. LIMA to LAD is patent.  Ost Ramus lesion is 90% stenosed.  Ramus lesion is 75% stenosed.  Ost Cx lesion is 80% stenosed.  Prox Cx to Mid Cx lesion is 50% stenosed. SVG to OM is patent.  Dist RCA lesion is 50% stenosed.  RPDA lesion is 40% stenosed. SVG to PDA is patent with proximal disease that is unchanged.  Previously placed Ost RCA to Prox RCA drug eluting stent is widely patent.  Graft to the diagonal was occluded. There was TIMI 3 flow in the small diagonal with proximal ectasia.  The left ventricular systolic function is normal.  LV end diastolic pressure is normal. LVEDP 11 mm Hg.  The left ventricular ejection fraction is 50-55% by visual estimate.  There is no  aortic valve stenosis.     Stress test 2018: nml   Neuro/Psych  Headaches, PSYCHIATRIC DISORDERS Anxiety Depression Bipolar Disorder CVA    GI/Hepatic GERD  Medicated and Controlled,(+) Hepatitis -, C  Endo/Other  negative endocrine ROS  Renal/GU negative Renal ROS  negative genitourinary   Musculoskeletal  (+) Arthritis , Osteoarthritis,  Fibromyalgia -  Abdominal   Peds  Hematology negative hematology ROS (+)   Anesthesia Other Findings   Reproductive/Obstetrics negative OB ROS                            Anesthesia Physical Anesthesia Plan  ASA: III  Anesthesia Plan: General   Post-op Pain Management:    Induction: Intravenous  PONV Risk Score and Plan: 2 and Ondansetron, Dexamethasone, Midazolam and Treatment may vary due to age or medical condition  Airway Management Planned: Oral ETT  Additional Equipment: None  Intra-op Plan:   Post-operative Plan: Extubation in OR  Informed Consent: I have reviewed the patients History and Physical, chart, labs and discussed the procedure including the risks, benefits and alternatives for the proposed anesthesia with the patient or authorized representative who has indicated his/her understanding and acceptance.     Dental advisory given  Plan Discussed with: CRNA  Anesthesia Plan Comments:        Anesthesia Quick Evaluation

## 2020-12-23 NOTE — Telephone Encounter (Signed)
Pt calling because he started having diarrhea last night(4/25). Pt states he only has stomach cramping and diarrhea, states he tested positive for Staphylococcus aureus(food poisoning). Wants to know if this will affect surgery tomorrow 4/27. Please advise. [phone]

## 2020-12-23 NOTE — Progress Notes (Signed)
Anesthesia Chart Review:  Follows with cardiology for history of CAD s/p anterior STEMI and emergent CABG in 2017.  In February 2019 he had a cath and PCI of the RCA due to narrowing of the graft to the distal RCA.  He has had multiple episodes of chest pain that been evaluated without intervention.  He had a cath in August 2020 for evaluation of chest pain, medical therapy recommended, no intervention.  He was again seen at the ED 10/24/2020 for chest pain.  Negative troponins.  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  He was last seen by Dr. Irish Lack 11/03/2020 and discussed that he would be seeing pulmonology and would likely need biopsy.  Per note, "Seeing pulmonary/preoperative cardiovascular eval- Dr. Valeta Harms. CT concerning for lung CA.  Family h/o lung CA.   OK to hold Plavix 5 days prior to biopsy."   Patient reported last dose Plavix 12/18/2020.  Hx of C7-T1 ACDF.  Preop labs reviewed, unremarkable.  EKG 12/22/20: Normal sinus rhythm. Rate 81. Possible Left atrial enlargement. Nonspecific ST abnormality. since last tracing no significant change  CHEST - 2 VIEW 12/22/2020: COMPARISON:  12/12/2020 CT.  FINDINGS: Prior median sternotomy.  Cervical spine fixation.  Midline trachea. Normal heart size. Atherosclerosis in the transverse aorta. No pleural effusion or pneumothorax. Right upper lobe 2.2 cm pulmonary nodule again identified. No lobar consolidation or other acute superimposed process.  IMPRESSION: Right upper lobe pulmonary nodule, as before.  Aortic Atherosclerosis (ICD10-I70.0).  CT Chest 12/12/20: IMPRESSION: 1. Right upper lobe lung nodule, felt to be minimally enlarged from 10/24/2020. This remains suspicious for primary bronchogenic carcinoma. 2. No thoracic adenopathy. 3. Aortic atherosclerosis (ICD10-I70.0) and emphysema (ICD10-J43.9).  PFT 11/13/2020: FVC-%Pred-Pre Latest Units: % 69  FEV1-%Pred-Pre Latest Units: % 57   FEV1FVC-%Pred-Pre Latest Units: % 82  TLC % pred Latest Units: % 94  RV % pred Latest Units: % 147  DLCO unc % pred Latest Units: % 90    Cath 04/11/19:  Mid LAD lesion is 55% stenosed.  Ost LAD to Prox LAD lesion is 50% stenosed. LIMA to LAD is patent.  Ost Ramus lesion is 90% stenosed.  Ramus lesion is 75% stenosed.  Ost Cx lesion is 80% stenosed.  Prox Cx to Mid Cx lesion is 50% stenosed. SVG to OM is patent.  Dist RCA lesion is 50% stenosed.  RPDA lesion is 40% stenosed. SVG to PDA is patent with proximal disease that is unchanged.  Previously placed Ost RCA to Prox RCA drug eluting stent is widely patent.  Graft to the diagonal was occluded. There was TIMI 3 flow in the small diagonal with proximal ectasia.  The left ventricular systolic function is normal.  LV end diastolic pressure is normal. LVEDP 11 mm Hg.  The left ventricular ejection fraction is 50-55% by visual estimate.  There is no aortic valve stenosis.   Continue medical therapy.    TTE 09/28/18: 1. The left ventricle has normal systolic function of 63-14%. The cavity  size is normal. There is normal left ventricular hypertrophy. Echo  evidence of normal diastolic filling patterns.  2. Normal left atrial size.  3. Normal right atrial size.  4. Normal tricuspid valve.  5. Tricuspid regurgitation is mild.  6. No atrial level shunt detected by color flow Doppler.   Wynonia Musty [facility]/Anesthesiology Phone [phone] 12/23/2020 10:40 AM

## 2020-12-23 NOTE — Telephone Encounter (Signed)
Called spoke with patient. States he started having diarrhea last night about 9pm.  He is drinking a lot of water but is still has a lot of gas.  He looked at this nasal swab and saw staphylococcus aureus was positive and believes he has food poisoning.  He does not want to have to put this surgery tomorrow off.    Routing to Dr. Valeta Harms for recommendations and to clarify if surgery would be canceled due to the diarrhea.

## 2020-12-23 NOTE — Telephone Encounter (Signed)
I called and explained that his MRSA nasal swap was positive and has nothing to do with the short bout of diarrhea he ad last night. Patient is feeling fine this morning. No issues for case tomorrow. Patient will plan to report to hospital in the AM as scheduled.   Garner Nash, DO [facility] 12/23/2020 9:15 AM

## 2020-12-23 NOTE — Anesthesia Preprocedure Evaluation (Addendum)
Anesthesia Evaluation  Patient identified by MRN, date of birth, ID band Patient awake    Reviewed: Allergy & Precautions, NPO status , Patient's Chart, lab work & pertinent test results  Airway Mallampati: II  TM Distance: >3 FB Neck ROM: Full    Dental  (+) Dental Advisory Given   Pulmonary former smoker,    breath sounds clear to auscultation       Cardiovascular hypertension, Pt. on medications and Pt. on home beta blockers + angina + CAD, + Past MI, + Cardiac Stents and + CABG   Rhythm:Regular Rate:Normal     Neuro/Psych  Headaches,  Neuromuscular disease CVA    GI/Hepatic GERD  ,(+) Hepatitis -  Endo/Other  negative endocrine ROS  Renal/GU negative Renal ROS     Musculoskeletal  (+) Arthritis ,   Abdominal   Peds  Hematology negative hematology ROS (+)   Anesthesia Other Findings   Reproductive/Obstetrics                            Lab Results  Component Value Date   WBC 12.1 (H) 12/22/2020   HGB 14.9 12/22/2020   HCT 45.1 12/22/2020   MCV 90.7 12/22/2020   PLT 272 12/22/2020   Lab Results  Component Value Date   CREATININE 1.13 12/22/2020   BUN 21 12/22/2020   NA 135 12/22/2020   K 4.1 12/22/2020   CL 105 12/22/2020   CO2 21 (L) 12/22/2020    Anesthesia Physical Anesthesia Plan  ASA: III  Anesthesia Plan: General   Post-op Pain Management:    Induction: Intravenous  PONV Risk Score and Plan: 2 and Dexamethasone, Ondansetron and Treatment may vary due to age or medical condition  Airway Management Planned: Double Lumen EBT and Oral ETT  Additional Equipment: Arterial line  Intra-op Plan:   Post-operative Plan: Extubation in OR and Possible Post-op intubation/ventilation  Informed Consent: I have reviewed the patients History and Physical, chart, labs and discussed the procedure including the risks, benefits and alternatives for the proposed anesthesia with  the patient or authorized representative who has indicated his/her understanding and acceptance.     Dental advisory given  Plan Discussed with: CRNA  Anesthesia Plan Comments: (PAT note by Karoline Caldwell, PA-C:  Follows with cardiology for history of CAD s/p anterior STEMI and emergent CABG in 2017.  In February 2019 he had a cath and PCI of the RCA due to narrowing of the graft to the distal RCA.  He has had multiple episodes of chest pain that been evaluated without intervention.  He had a cath in August 2020 for evaluation of chest pain, medical therapy recommended, no intervention.  He was again seen at the ED 10/24/2020 for chest pain.  Negative troponins.  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  He was last seen by Dr. Irish Lack 11/03/2020 and discussed that he would be seeing pulmonology and would likely need biopsy.  Per note, "Seeing pulmonary/preoperative cardiovascular eval- Dr. Valeta Harms. CT concerning for lung CA.  Family h/o lung CA.   OK to hold Plavix 5 days prior to biopsy."   Patient reported last dose Plavix 12/18/2020.  Hx of C7-T1 ACDF.  Preop labs reviewed, unremarkable.  EKG 12/22/20: Normal sinus rhythm. Rate 81. Possible Left atrial enlargement. Nonspecific ST abnormality. since last tracing no significant change  CHEST - 2 VIEW 12/22/2020: COMPARISON:  12/12/2020 CT.  FINDINGS: Prior median sternotomy.  Cervical spine fixation.  Midline trachea. Normal heart size. Atherosclerosis in the transverse aorta. No pleural effusion or pneumothorax. Right upper lobe 2.2 cm pulmonary nodule again identified. No lobar consolidation or other acute superimposed process.  IMPRESSION: Right upper lobe pulmonary nodule, as before.  Aortic Atherosclerosis (ICD10-I70.0).  CT Chest 12/12/20: IMPRESSION: 1. Right upper lobe lung nodule, felt to be minimally enlarged from 10/24/2020. This remains suspicious for primary bronchogenic carcinoma. 2. No  thoracic adenopathy. 3. Aortic atherosclerosis (ICD10-I70.0) and emphysema (ICD10-J43.9).  PFT 11/13/2020: FVC-%Pred-Pre Latest Units: % 69 FEV1-%Pred-Pre Latest Units: % 57 FEV1FVC-%Pred-Pre Latest Units: % 82 TLC % pred Latest Units: % 94 RV % pred Latest Units: % 147 DLCO unc % pred Latest Units: % 90   Cath 04/11/19: Mid LAD lesion is 55% stenosed. Ost LAD to Prox LAD lesion is 50% stenosed. LIMA to LAD is patent. Ost Ramus lesion is 90% stenosed. Ramus lesion is 75% stenosed. Ost Cx lesion is 80% stenosed. Prox Cx to Mid Cx lesion is 50% stenosed. SVG to OM is patent. Dist RCA lesion is 50% stenosed. RPDA lesion is 40% stenosed. SVG to PDA is patent with proximal disease that is unchanged. Previously placed Ost RCA to Prox RCA drug eluting stent is widely patent. Graft to the diagonal was occluded. There was TIMI 3 flow in the small diagonal with proximal ectasia. The left ventricular systolic function is normal. LV end diastolic pressure is normal. LVEDP 11 mm Hg. The left ventricular ejection fraction is 50-55% by visual estimate. There is no aortic valve stenosis.   Continue medical therapy.    TTE 09/28/18: 1. The left ventricle has normal systolic function of 93-90%. The cavity  size is normal. There is normal left ventricular hypertrophy. Echo  evidence of normal diastolic filling patterns.  2. Normal left atrial size.  3. Normal right atrial size.  4. Normal tricuspid valve.  5. Tricuspid regurgitation is mild.  6. No atrial level shunt detected by color flow Doppler.  )      Anesthesia Quick Evaluation

## 2020-12-24 ENCOUNTER — Other Ambulatory Visit: Payer: Self-pay

## 2020-12-24 ENCOUNTER — Encounter (HOSPITAL_COMMUNITY)
Admission: RE | Disposition: A | Discharge status: Home / Self Care | Payer: Self-pay | Source: Home / Self Care | Attending: Thoracic Surgery (Cardiothoracic Vascular Surgery)

## 2020-12-24 ENCOUNTER — Inpatient Hospital Stay (HOSPITAL_COMMUNITY)
Admission: RE | Admit: 2020-12-24 | Discharge: 2021-01-07 | DRG: 164 | Disposition: A | Discharge status: Home / Self Care | Payer: Medicare Other | Attending: Thoracic Surgery (Cardiothoracic Vascular Surgery) | Admitting: Thoracic Surgery (Cardiothoracic Vascular Surgery)

## 2020-12-24 ENCOUNTER — Encounter (HOSPITAL_COMMUNITY): Payer: Self-pay | Admitting: Pulmonary Disease

## 2020-12-24 ENCOUNTER — Ambulatory Visit (HOSPITAL_COMMUNITY): Payer: Medicare Other | Admitting: Certified Registered"

## 2020-12-24 ENCOUNTER — Ambulatory Visit (HOSPITAL_COMMUNITY): Payer: Medicare Other

## 2020-12-24 ENCOUNTER — Ambulatory Visit (HOSPITAL_COMMUNITY): Payer: Medicare Other | Admitting: Anesthesiology

## 2020-12-24 ENCOUNTER — Inpatient Hospital Stay (HOSPITAL_COMMUNITY): Payer: Medicare Other

## 2020-12-24 ENCOUNTER — Ambulatory Visit (HOSPITAL_COMMUNITY): Payer: Medicare Other | Admitting: Physician Assistant

## 2020-12-24 DIAGNOSIS — D62 Acute posthemorrhagic anemia: Secondary | ICD-10-CM | POA: Diagnosis not present

## 2020-12-24 DIAGNOSIS — C3411 Malignant neoplasm of upper lobe, right bronchus or lung: Principal | ICD-10-CM | POA: Diagnosis present

## 2020-12-24 DIAGNOSIS — K219 Gastro-esophageal reflux disease without esophagitis: Secondary | ICD-10-CM | POA: Diagnosis present

## 2020-12-24 DIAGNOSIS — F32A Depression, unspecified: Secondary | ICD-10-CM | POA: Diagnosis present

## 2020-12-24 DIAGNOSIS — Z91013 Allergy to seafood: Secondary | ICD-10-CM

## 2020-12-24 DIAGNOSIS — Z87891 Personal history of nicotine dependence: Secondary | ICD-10-CM | POA: Diagnosis not present

## 2020-12-24 DIAGNOSIS — Z951 Presence of aortocoronary bypass graft: Secondary | ICD-10-CM

## 2020-12-24 DIAGNOSIS — K200 Eosinophilic esophagitis: Secondary | ICD-10-CM | POA: Diagnosis present

## 2020-12-24 DIAGNOSIS — Z801 Family history of malignant neoplasm of trachea, bronchus and lung: Secondary | ICD-10-CM

## 2020-12-24 DIAGNOSIS — I2510 Atherosclerotic heart disease of native coronary artery without angina pectoris: Secondary | ICD-10-CM | POA: Diagnosis present

## 2020-12-24 DIAGNOSIS — E785 Hyperlipidemia, unspecified: Secondary | ICD-10-CM | POA: Diagnosis present

## 2020-12-24 DIAGNOSIS — I6523 Occlusion and stenosis of bilateral carotid arteries: Secondary | ICD-10-CM | POA: Diagnosis not present

## 2020-12-24 DIAGNOSIS — Z419 Encounter for procedure for purposes other than remedying health state, unspecified: Secondary | ICD-10-CM

## 2020-12-24 DIAGNOSIS — Z8616 Personal history of COVID-19: Secondary | ICD-10-CM

## 2020-12-24 DIAGNOSIS — I25810 Atherosclerosis of coronary artery bypass graft(s) without angina pectoris: Secondary | ICD-10-CM | POA: Diagnosis not present

## 2020-12-24 DIAGNOSIS — R911 Solitary pulmonary nodule: Secondary | ICD-10-CM | POA: Diagnosis not present

## 2020-12-24 DIAGNOSIS — G43909 Migraine, unspecified, not intractable, without status migrainosus: Secondary | ICD-10-CM | POA: Diagnosis present

## 2020-12-24 DIAGNOSIS — I255 Ischemic cardiomyopathy: Secondary | ICD-10-CM | POA: Diagnosis present

## 2020-12-24 DIAGNOSIS — E7800 Pure hypercholesterolemia, unspecified: Secondary | ICD-10-CM | POA: Diagnosis present

## 2020-12-24 DIAGNOSIS — Z8249 Family history of ischemic heart disease and other diseases of the circulatory system: Secondary | ICD-10-CM

## 2020-12-24 DIAGNOSIS — Z7902 Long term (current) use of antithrombotics/antiplatelets: Secondary | ICD-10-CM

## 2020-12-24 DIAGNOSIS — Y836 Removal of other organ (partial) (total) as the cause of abnormal reaction of the patient, or of later complication, without mention of misadventure at the time of the procedure: Secondary | ICD-10-CM | POA: Diagnosis not present

## 2020-12-24 DIAGNOSIS — Z20822 Contact with and (suspected) exposure to covid-19: Secondary | ICD-10-CM | POA: Diagnosis not present

## 2020-12-24 DIAGNOSIS — I10 Essential (primary) hypertension: Secondary | ICD-10-CM | POA: Diagnosis not present

## 2020-12-24 DIAGNOSIS — Z7982 Long term (current) use of aspirin: Secondary | ICD-10-CM

## 2020-12-24 DIAGNOSIS — I509 Heart failure, unspecified: Secondary | ICD-10-CM | POA: Diagnosis present

## 2020-12-24 DIAGNOSIS — J95812 Postprocedural air leak: Secondary | ICD-10-CM | POA: Diagnosis not present

## 2020-12-24 DIAGNOSIS — Z8673 Personal history of transient ischemic attack (TIA), and cerebral infarction without residual deficits: Secondary | ICD-10-CM

## 2020-12-24 DIAGNOSIS — Z823 Family history of stroke: Secondary | ICD-10-CM

## 2020-12-24 DIAGNOSIS — Z902 Acquired absence of lung [part of]: Secondary | ICD-10-CM | POA: Diagnosis not present

## 2020-12-24 DIAGNOSIS — C771 Secondary and unspecified malignant neoplasm of intrathoracic lymph nodes: Secondary | ICD-10-CM | POA: Diagnosis not present

## 2020-12-24 DIAGNOSIS — D72829 Elevated white blood cell count, unspecified: Secondary | ICD-10-CM | POA: Diagnosis present

## 2020-12-24 DIAGNOSIS — I252 Old myocardial infarction: Secondary | ICD-10-CM

## 2020-12-24 DIAGNOSIS — I110 Hypertensive heart disease with heart failure: Secondary | ICD-10-CM | POA: Diagnosis present

## 2020-12-24 DIAGNOSIS — R002 Palpitations: Secondary | ICD-10-CM

## 2020-12-24 DIAGNOSIS — Z85828 Personal history of other malignant neoplasm of skin: Secondary | ICD-10-CM | POA: Diagnosis not present

## 2020-12-24 DIAGNOSIS — Z4682 Encounter for fitting and adjustment of non-vascular catheter: Secondary | ICD-10-CM

## 2020-12-24 DIAGNOSIS — Z885 Allergy status to narcotic agent status: Secondary | ICD-10-CM

## 2020-12-24 DIAGNOSIS — Z8619 Personal history of other infectious and parasitic diseases: Secondary | ICD-10-CM

## 2020-12-24 DIAGNOSIS — Z87442 Personal history of urinary calculi: Secondary | ICD-10-CM

## 2020-12-24 DIAGNOSIS — I071 Rheumatic tricuspid insufficiency: Secondary | ICD-10-CM | POA: Diagnosis present

## 2020-12-24 DIAGNOSIS — J939 Pneumothorax, unspecified: Secondary | ICD-10-CM

## 2020-12-24 DIAGNOSIS — M109 Gout, unspecified: Secondary | ICD-10-CM | POA: Diagnosis present

## 2020-12-24 DIAGNOSIS — J9382 Other air leak: Secondary | ICD-10-CM

## 2020-12-24 DIAGNOSIS — Z981 Arthrodesis status: Secondary | ICD-10-CM

## 2020-12-24 DIAGNOSIS — I471 Supraventricular tachycardia: Secondary | ICD-10-CM

## 2020-12-24 DIAGNOSIS — I25118 Atherosclerotic heart disease of native coronary artery with other forms of angina pectoris: Secondary | ICD-10-CM | POA: Diagnosis not present

## 2020-12-24 DIAGNOSIS — F419 Anxiety disorder, unspecified: Secondary | ICD-10-CM | POA: Diagnosis present

## 2020-12-24 DIAGNOSIS — Z09 Encounter for follow-up examination after completed treatment for conditions other than malignant neoplasm: Secondary | ICD-10-CM

## 2020-12-24 DIAGNOSIS — Z9689 Presence of other specified functional implants: Secondary | ICD-10-CM

## 2020-12-24 DIAGNOSIS — I480 Paroxysmal atrial fibrillation: Secondary | ICD-10-CM | POA: Diagnosis not present

## 2020-12-24 DIAGNOSIS — I4891 Unspecified atrial fibrillation: Secondary | ICD-10-CM | POA: Diagnosis not present

## 2020-12-24 DIAGNOSIS — Z888 Allergy status to other drugs, medicaments and biological substances status: Secondary | ICD-10-CM

## 2020-12-24 DIAGNOSIS — Z79899 Other long term (current) drug therapy: Secondary | ICD-10-CM

## 2020-12-24 DIAGNOSIS — Z887 Allergy status to serum and vaccine status: Secondary | ICD-10-CM

## 2020-12-24 HISTORY — PX: VIDEO BRONCHOSCOPY WITH ENDOBRONCHIAL NAVIGATION: SHX6175

## 2020-12-24 HISTORY — PX: BRONCHIAL BIOPSY: SHX5109

## 2020-12-24 HISTORY — PX: BRONCHIAL NEEDLE ASPIRATION BIOPSY: SHX5106

## 2020-12-24 HISTORY — PX: FIDUCIAL MARKER PLACEMENT: SHX6858

## 2020-12-24 HISTORY — PX: VIDEO BRONCHOSCOPY WITH ENDOBRONCHIAL ULTRASOUND: SHX6177

## 2020-12-24 HISTORY — PX: NODE DISSECTION: SHX5269

## 2020-12-24 HISTORY — PX: BRONCHIAL BRUSHINGS: SHX5108

## 2020-12-24 HISTORY — PX: INTERCOSTAL NERVE BLOCK: SHX5021

## 2020-12-24 SURGERY — VIDEO BRONCHOSCOPY WITH ENDOBRONCHIAL NAVIGATION
Anesthesia: General | Laterality: Right

## 2020-12-24 SURGERY — LOBECTOMY, LUNG, ROBOT-ASSISTED, USING VATS
Anesthesia: General | Site: Chest | Laterality: Right

## 2020-12-24 MED ORDER — DEXAMETHASONE SODIUM PHOSPHATE 10 MG/ML IJ SOLN
INTRAMUSCULAR | Status: DC | PRN
  Administered 2020-12-24: 5 mg via INTRAVENOUS

## 2020-12-24 MED ORDER — METHYLENE BLUE 1 % INJ SOLN
1.0000 mL | Freq: Once | INTRAMUSCULAR | Status: DC
  Filled 2020-12-24: qty 1

## 2020-12-24 MED ORDER — FENTANYL CITRATE (PF) 250 MCG/5ML IJ SOLN
INTRAMUSCULAR | Status: AC
  Filled 2020-12-24: qty 5

## 2020-12-24 MED ORDER — ONDANSETRON HCL 4 MG/2ML IJ SOLN
INTRAMUSCULAR | Status: DC | PRN
  Administered 2020-12-24: 4 mg via INTRAVENOUS

## 2020-12-24 MED ORDER — OXYCODONE HCL 5 MG PO TABS: 5.0000 mg | ORAL_TABLET | Freq: Once | ORAL | Status: DC | PRN

## 2020-12-24 MED ORDER — CEFAZOLIN SODIUM-DEXTROSE 2-4 GM/100ML-% IV SOLN
2.0000 g | INTRAVENOUS | Status: AC
  Administered 2020-12-24: 2 g via INTRAVENOUS

## 2020-12-24 MED ORDER — HYDROMORPHONE HCL 1 MG/ML IJ SOLN
0.2500 mg | INTRAMUSCULAR | Status: DC | PRN
  Administered 2020-12-24 (×2): 0.5 mg via INTRAVENOUS

## 2020-12-24 MED ORDER — PHENYLEPHRINE HCL (PRESSORS) 10 MG/ML IV SOLN
INTRAVENOUS | Status: DC | PRN
  Administered 2020-12-24 (×2): 80 ug via INTRAVENOUS

## 2020-12-24 MED ORDER — FLUTICASONE PROPIONATE 50 MCG/ACT NA SUSP
1.0000 | Freq: Every day | NASAL | Status: DC | PRN
  Filled 2020-12-24: qty 16

## 2020-12-24 MED ORDER — ONDANSETRON HCL 4 MG/2ML IJ SOLN: 4.0000 mg | Freq: Four times a day (QID) | INTRAMUSCULAR | Status: DC | PRN

## 2020-12-24 MED ORDER — PROPOFOL 10 MG/ML IV BOLUS
INTRAVENOUS | Status: DC | PRN
  Administered 2020-12-24: 20 mg via INTRAVENOUS
  Administered 2020-12-24 (×3): 10 mg via INTRAVENOUS
  Administered 2020-12-24: 200 mg via INTRAVENOUS

## 2020-12-24 MED ORDER — HYDROMORPHONE HCL 1 MG/ML IJ SOLN
0.5000 mg | INTRAMUSCULAR | Status: DC | PRN
Start: 2020-12-24 — End: 2021-01-06
  Administered 2020-12-24 – 2021-01-06 (×80): 0.5 mg via INTRAVENOUS
  Filled 2020-12-24 (×81): qty 0.5

## 2020-12-24 MED ORDER — OXYCODONE HCL 5 MG/5ML PO SOLN
5.0000 mg | Freq: Once | ORAL | Status: DC | PRN
Start: 2020-12-24 — End: 2020-12-24

## 2020-12-24 MED ORDER — CHLORDIAZEPOXIDE HCL 5 MG PO CAPS
10.0000 mg | ORAL_CAPSULE | Freq: Three times a day (TID) | ORAL | Status: DC | PRN
  Administered 2021-01-07: 10 mg via ORAL
  Filled 2020-12-24: qty 2
  Filled 2020-12-24: qty 4

## 2020-12-24 MED ORDER — HEMOSTATIC AGENTS (NO CHARGE) OPTIME
TOPICAL | Status: DC | PRN
  Administered 2020-12-24: 1 via TOPICAL

## 2020-12-24 MED ORDER — HYDROMORPHONE HCL 1 MG/ML IJ SOLN
INTRAMUSCULAR | Status: AC
  Filled 2020-12-24: qty 1

## 2020-12-24 MED ORDER — BUPIVACAINE LIPOSOME 1.3 % IJ SUSP
INTRAMUSCULAR | Status: AC
  Filled 2020-12-24: qty 20

## 2020-12-24 MED ORDER — PROMETHAZINE HCL 25 MG/ML IJ SOLN: 6.2500 mg | INTRAMUSCULAR | Status: DC | PRN

## 2020-12-24 MED ORDER — FENTANYL CITRATE (PF) 100 MCG/2ML IJ SOLN
INTRAMUSCULAR | Status: DC | PRN
  Administered 2020-12-24 (×4): 50 ug via INTRAVENOUS

## 2020-12-24 MED ORDER — EPHEDRINE SULFATE-NACL 50-0.9 MG/10ML-% IV SOSY
PREFILLED_SYRINGE | INTRAVENOUS | Status: DC | PRN
  Administered 2020-12-24: 7.5 mg via INTRAVENOUS

## 2020-12-24 MED ORDER — ATORVASTATIN CALCIUM 80 MG PO TABS
80.0000 mg | ORAL_TABLET | Freq: Every day | ORAL | Status: DC
  Administered 2020-12-25 – 2021-01-07 (×13): 80 mg via ORAL
  Filled 2020-12-24 (×4): qty 1
  Filled 2020-12-24: qty 2
  Filled 2020-12-24 (×8): qty 1

## 2020-12-24 MED ORDER — CEFAZOLIN SODIUM-DEXTROSE 2-4 GM/100ML-% IV SOLN
INTRAVENOUS | Status: AC
  Filled 2020-12-24: qty 100

## 2020-12-24 MED ORDER — FENTANYL CITRATE (PF) 100 MCG/2ML IJ SOLN: 25.0000 ug | INTRAMUSCULAR | Status: DC | PRN

## 2020-12-24 MED ORDER — SODIUM CHLORIDE FLUSH 0.9 % IV SOLN
INTRAVENOUS | Status: DC | PRN
  Administered 2020-12-24: 100 mL

## 2020-12-24 MED ORDER — CHLORHEXIDINE GLUCONATE 0.12 % MT SOLN
OROMUCOSAL | Status: AC
  Administered 2020-12-24: 15 mL via OROMUCOSAL
  Filled 2020-12-24: qty 15

## 2020-12-24 MED ORDER — ONDANSETRON HCL 4 MG/2ML IJ SOLN: 4.0000 mg | Freq: Once | INTRAMUSCULAR | Status: DC | PRN

## 2020-12-24 MED ORDER — ACETAMINOPHEN 160 MG/5ML PO SOLN
1000.0000 mg | Freq: Four times a day (QID) | ORAL | Status: AC
  Filled 2020-12-24: qty 40.6

## 2020-12-24 MED ORDER — MIDAZOLAM HCL 5 MG/5ML IJ SOLN
INTRAMUSCULAR | Status: DC | PRN
  Administered 2020-12-24: 2 mg via INTRAVENOUS

## 2020-12-24 MED ORDER — LACTATED RINGERS IV SOLN: INTRAVENOUS | Status: DC

## 2020-12-24 MED ORDER — METHYLENE BLUE 0.5 % INJ SOLN: 1.0000 mg/kg | Freq: Once | Status: DC

## 2020-12-24 MED ORDER — PROPOFOL 10 MG/ML IV BOLUS
INTRAVENOUS | Status: AC
  Filled 2020-12-24: qty 20

## 2020-12-24 MED ORDER — ROCURONIUM BROMIDE 10 MG/ML (PF) SYRINGE
PREFILLED_SYRINGE | INTRAVENOUS | Status: AC
  Filled 2020-12-24: qty 10

## 2020-12-24 MED ORDER — CEFAZOLIN SODIUM-DEXTROSE 2-4 GM/100ML-% IV SOLN
2.0000 g | Freq: Three times a day (TID) | INTRAVENOUS | Status: AC
  Administered 2020-12-24 (×2): 2 g via INTRAVENOUS
  Filled 2020-12-24 (×2): qty 100

## 2020-12-24 MED ORDER — METHYLENE BLUE 0.5 % INJ SOLN
INTRAVENOUS | Status: DC | PRN
  Administered 2020-12-24: 2 mL

## 2020-12-24 MED ORDER — TRAMADOL HCL 50 MG PO TABS
50.0000 mg | ORAL_TABLET | Freq: Four times a day (QID) | ORAL | Status: DC | PRN
  Administered 2020-12-25: 100 mg via ORAL
  Filled 2020-12-24: qty 2

## 2020-12-24 MED ORDER — ACETAMINOPHEN 500 MG PO TABS
1000.0000 mg | ORAL_TABLET | Freq: Four times a day (QID) | ORAL | Status: AC
  Administered 2020-12-24 – 2020-12-27 (×10): 1000 mg via ORAL
  Administered 2020-12-27: 500 mg via ORAL
  Administered 2020-12-27 – 2020-12-29 (×6): 1000 mg via ORAL
  Filled 2020-12-24 (×18): qty 2

## 2020-12-24 MED ORDER — CLONIDINE HCL 0.1 MG PO TABS
0.1000 mg | ORAL_TABLET | Freq: Every day | ORAL | Status: DC
  Administered 2020-12-24 – 2021-01-06 (×14): 0.1 mg via ORAL
  Filled 2020-12-24 (×14): qty 1

## 2020-12-24 MED ORDER — ONDANSETRON HCL 4 MG/2ML IJ SOLN
INTRAMUSCULAR | Status: AC
  Filled 2020-12-24: qty 2

## 2020-12-24 MED ORDER — LIDOCAINE 2% (20 MG/ML) 5 ML SYRINGE
INTRAMUSCULAR | Status: DC | PRN
  Administered 2020-12-24: 60 mg via INTRAVENOUS

## 2020-12-24 MED ORDER — MIDAZOLAM HCL 2 MG/2ML IJ SOLN
INTRAMUSCULAR | Status: AC
  Filled 2020-12-24: qty 2

## 2020-12-24 MED ORDER — LACTATED RINGERS IV SOLN: INTRAVENOUS | Status: DC | PRN

## 2020-12-24 MED ORDER — AMLODIPINE BESYLATE 10 MG PO TABS
10.0000 mg | ORAL_TABLET | Freq: Every day | ORAL | Status: DC
  Administered 2020-12-25 – 2021-01-07 (×11): 10 mg via ORAL
  Filled 2020-12-24 (×13): qty 1

## 2020-12-24 MED ORDER — LIDOCAINE 2% (20 MG/ML) 5 ML SYRINGE
INTRAMUSCULAR | Status: AC
  Filled 2020-12-24: qty 5

## 2020-12-24 MED ORDER — ORAL CARE MOUTH RINSE: 15.0000 mL | Freq: Once | OROMUCOSAL | Status: AC

## 2020-12-24 MED ORDER — FENTANYL CITRATE (PF) 100 MCG/2ML IJ SOLN
25.0000 ug | INTRAMUSCULAR | Status: DC | PRN
  Administered 2020-12-24: 50 ug via INTRAVENOUS
  Filled 2020-12-24: qty 2

## 2020-12-24 MED ORDER — SENNOSIDES-DOCUSATE SODIUM 8.6-50 MG PO TABS
1.0000 | ORAL_TABLET | Freq: Every day | ORAL | Status: DC
  Administered 2020-12-24 – 2021-01-02 (×7): 1 via ORAL
  Filled 2020-12-24 (×12): qty 1

## 2020-12-24 MED ORDER — ACETAMINOPHEN 500 MG PO TABS
1000.0000 mg | ORAL_TABLET | Freq: Once | ORAL | Status: AC
  Administered 2020-12-24: 1000 mg via ORAL
  Filled 2020-12-24: qty 2

## 2020-12-24 MED ORDER — ASPIRIN EC 81 MG PO TBEC
81.0000 mg | DELAYED_RELEASE_TABLET | Freq: Every day | ORAL | Status: DC
  Administered 2020-12-25 – 2021-01-07 (×13): 81 mg via ORAL
  Filled 2020-12-24 (×14): qty 1

## 2020-12-24 MED ORDER — CHLORHEXIDINE GLUCONATE 0.12 % MT SOLN
15.0000 mL | Freq: Once | OROMUCOSAL | Status: AC
  Filled 2020-12-24: qty 15

## 2020-12-24 MED ORDER — ROCURONIUM BROMIDE 10 MG/ML (PF) SYRINGE
PREFILLED_SYRINGE | INTRAVENOUS | Status: DC | PRN
  Administered 2020-12-24: 100 mg via INTRAVENOUS
  Administered 2020-12-24: 50 mg via INTRAVENOUS

## 2020-12-24 MED ORDER — PHENYLEPHRINE 40 MCG/ML (10ML) SYRINGE FOR IV PUSH (FOR BLOOD PRESSURE SUPPORT)
PREFILLED_SYRINGE | INTRAVENOUS | Status: DC | PRN
  Administered 2020-12-24: 120 ug via INTRAVENOUS
  Administered 2020-12-24: 40 ug via INTRAVENOUS
  Administered 2020-12-24: 120 ug via INTRAVENOUS
  Administered 2020-12-24: 80 ug via INTRAVENOUS
  Administered 2020-12-24: 120 ug via INTRAVENOUS

## 2020-12-24 MED ORDER — KETOROLAC TROMETHAMINE 30 MG/ML IJ SOLN
30.0000 mg | Freq: Four times a day (QID) | INTRAMUSCULAR | Status: AC
  Administered 2020-12-24 – 2020-12-29 (×20): 30 mg via INTRAVENOUS
  Filled 2020-12-24 (×20): qty 1

## 2020-12-24 MED ORDER — PROPOFOL 1000 MG/100ML IV EMUL
INTRAVENOUS | Status: AC
  Filled 2020-12-24: qty 100

## 2020-12-24 MED ORDER — BUPIVACAINE HCL (PF) 0.5 % IJ SOLN
INTRAMUSCULAR | Status: AC
  Filled 2020-12-24: qty 30

## 2020-12-24 MED ORDER — DEXAMETHASONE SODIUM PHOSPHATE 10 MG/ML IJ SOLN
INTRAMUSCULAR | Status: AC
  Filled 2020-12-24: qty 2

## 2020-12-24 MED ORDER — BISACODYL 5 MG PO TBEC
10.0000 mg | DELAYED_RELEASE_TABLET | Freq: Every day | ORAL | Status: DC
  Administered 2020-12-25 – 2021-01-05 (×6): 10 mg via ORAL
  Filled 2020-12-24 (×12): qty 2

## 2020-12-24 MED ORDER — METOPROLOL SUCCINATE ER 25 MG PO TB24
25.0000 mg | ORAL_TABLET | Freq: Once | ORAL | Status: AC
  Administered 2020-12-24: 25 mg via ORAL
  Filled 2020-12-24: qty 1

## 2020-12-24 MED ORDER — SODIUM CHLORIDE 0.9 % IV SOLN: INTRAVENOUS | Status: DC | PRN

## 2020-12-24 MED ORDER — 0.9 % SODIUM CHLORIDE (POUR BTL) OPTIME
TOPICAL | Status: DC | PRN
  Administered 2020-12-24: 2000 mL

## 2020-12-24 MED ORDER — CLOPIDOGREL BISULFATE 75 MG PO TABS
75.0000 mg | ORAL_TABLET | Freq: Every day | ORAL | Status: DC
  Administered 2020-12-25 – 2021-01-07 (×13): 75 mg via ORAL
  Filled 2020-12-24 (×14): qty 1

## 2020-12-24 MED ORDER — KETOROLAC TROMETHAMINE 30 MG/ML IJ SOLN: 30.0000 mg | Freq: Four times a day (QID) | INTRAMUSCULAR | Status: DC | PRN

## 2020-12-24 MED ORDER — METOPROLOL SUCCINATE ER 25 MG PO TB24
25.0000 mg | ORAL_TABLET | Freq: Every day | ORAL | Status: DC
  Administered 2020-12-25 – 2021-01-07 (×14): 25 mg via ORAL
  Filled 2020-12-24 (×15): qty 1

## 2020-12-24 MED ORDER — LISINOPRIL 5 MG PO TABS: 5.0000 mg | ORAL_TABLET | Freq: Every day | ORAL | Status: DC

## 2020-12-24 MED ORDER — INDOCYANINE GREEN 25 MG IV SOLR
25.0000 mg | Freq: Once | INTRAVENOUS | Status: DC
  Filled 2020-12-24: qty 10

## 2020-12-24 MED ORDER — SUGAMMADEX SODIUM 200 MG/2ML IV SOLN
INTRAVENOUS | Status: DC | PRN
  Administered 2020-12-24: 300 mg via INTRAVENOUS

## 2020-12-24 SURGICAL SUPPLY — 50 items
ADAPTER BRONCH F/PENTAX (ADAPTER) ×4 IMPLANT
ADAPTER VALVE BIOPSY EBUS (MISCELLANEOUS) IMPLANT
ADPTR VALVE BIOPSY EBUS (MISCELLANEOUS)
BRUSH CYTOL CELLEBRITY 1.5X140 (MISCELLANEOUS) ×4 IMPLANT
BRUSH SUPERTRAX BIOPSY (INSTRUMENTS) IMPLANT
BRUSH SUPERTRAX NDL-TIP CYTO (INSTRUMENTS) ×4 IMPLANT
CANISTER SUCT 3000ML PPV (MISCELLANEOUS) ×4 IMPLANT
CHANNEL WORK EXTEND EDGE 180 (KITS) IMPLANT
CHANNEL WORK EXTEND EDGE 45 (KITS) IMPLANT
CHANNEL WORK EXTEND EDGE 90 (KITS) IMPLANT
CONT SPEC 4OZ CLIKSEAL STRL BL (MISCELLANEOUS) ×4 IMPLANT
COVER BACK TABLE 60X90IN (DRAPES) ×4 IMPLANT
COVER DOME SNAP 22 D (MISCELLANEOUS) ×4 IMPLANT
FILTER STRAW FLUID ASPIR (MISCELLANEOUS) IMPLANT
FORCEPS BIOP RJ4 1.8 (CUTTING FORCEPS) IMPLANT
FORCEPS BIOP SUPERTRX PREMAR (INSTRUMENTS) ×4 IMPLANT
GAUZE SPONGE 4X4 12PLY STRL (GAUZE/BANDAGES/DRESSINGS) ×4 IMPLANT
GLOVE BIO SURGEON STRL SZ7.5 (GLOVE) ×4 IMPLANT
GLOVE SURG SS PI 7.5 STRL IVOR (GLOVE) ×8 IMPLANT
GOWN STRL REUS W/ TWL LRG LVL3 (GOWN DISPOSABLE) ×6 IMPLANT
GOWN STRL REUS W/TWL LRG LVL3 (GOWN DISPOSABLE) ×8
KIT CLEAN ENDO COMPLIANCE (KITS) ×8 IMPLANT
KIT LOCATABLE GUIDE (CANNULA) IMPLANT
KIT MARKER FIDUCIAL DELIVERY (KITS) IMPLANT
KIT PROCEDURE EDGE 180 (KITS) IMPLANT
KIT PROCEDURE EDGE 45 (KITS) IMPLANT
KIT PROCEDURE EDGE 90 (KITS) IMPLANT
KIT TURNOVER KIT B (KITS) ×4 IMPLANT
MARKER SKIN DUAL TIP RULER LAB (MISCELLANEOUS) ×4 IMPLANT
NEEDLE EBUS SONO TIP PENTAX (NEEDLE) ×4 IMPLANT
NEEDLE SUPERTRX PREMARK BIOPSY (NEEDLE) ×4 IMPLANT
NS IRRIG 1000ML POUR BTL (IV SOLUTION) ×4 IMPLANT
OIL SILICONE PENTAX (PARTS (SERVICE/REPAIRS)) ×4 IMPLANT
PAD ARMBOARD 7.5X6 YLW CONV (MISCELLANEOUS) ×8 IMPLANT
PATCHES PATIENT (LABEL) ×12 IMPLANT
SOL ANTI FOG 6CC (MISCELLANEOUS) ×3 IMPLANT
SOLUTION ANTI FOG 6CC (MISCELLANEOUS) ×1
SYR 20CC LL (SYRINGE) ×8 IMPLANT
SYR 20ML ECCENTRIC (SYRINGE) ×8 IMPLANT
SYR 50ML SLIP (SYRINGE) ×4 IMPLANT
SYR 5ML LUER SLIP (SYRINGE) ×4 IMPLANT
TOWEL OR 17X24 6PK STRL BLUE (TOWEL DISPOSABLE) ×4 IMPLANT
TRAP SPECIMEN MUCOUS 40CC (MISCELLANEOUS) IMPLANT
TUBE CONNECTING 20X1/4 (TUBING) ×8 IMPLANT
UNDERPAD 30X30 (UNDERPADS AND DIAPERS) ×4 IMPLANT
VALVE BIOPSY  SINGLE USE (MISCELLANEOUS) ×4
VALVE BIOPSY SINGLE USE (MISCELLANEOUS) ×3 IMPLANT
VALVE DISPOSABLE (MISCELLANEOUS) ×4 IMPLANT
VALVE SUCTION BRONCHIO DISP (MISCELLANEOUS) ×4 IMPLANT
WATER STERILE IRR 1000ML POUR (IV SOLUTION) ×4 IMPLANT

## 2020-12-24 SURGICAL SUPPLY — 119 items
BLADE CLIPPER SURG (BLADE) IMPLANT
BLADE SURG 11 STRL SS (BLADE) ×2 IMPLANT
BNDG COHESIVE 6X5 TAN STRL LF (GAUZE/BANDAGES/DRESSINGS) ×2 IMPLANT
CANISTER SUCT 3000ML PPV (MISCELLANEOUS) ×4 IMPLANT
CANNULA REDUC XI 12-8 STAPL (CANNULA) ×4
CANNULA REDUCER 12-8 DVNC XI (CANNULA) ×2 IMPLANT
CATH THORACIC 28FR (CATHETERS) ×2 IMPLANT
CATH THORACIC 28FR RT ANG (CATHETERS) IMPLANT
CATH THORACIC 36FR (CATHETERS) IMPLANT
CATH THORACIC 36FR RT ANG (CATHETERS) IMPLANT
CATH TROCAR 20FR (CATHETERS) IMPLANT
CHLORAPREP W/TINT 26 (MISCELLANEOUS) ×2 IMPLANT
CLIP VESOCCLUDE MED 6/CT (CLIP) IMPLANT
CNTNR URN SCR LID CUP LEK RST (MISCELLANEOUS) ×9 IMPLANT
CONN ST 1/4X3/8  BEN (MISCELLANEOUS)
CONN ST 1/4X3/8 BEN (MISCELLANEOUS) IMPLANT
CONN Y 3/8X3/8X3/8  BEN (MISCELLANEOUS)
CONN Y 3/8X3/8X3/8 BEN (MISCELLANEOUS) IMPLANT
CONT SPEC 4OZ STRL OR WHT (MISCELLANEOUS) ×18
COVER SURGICAL LIGHT HANDLE (MISCELLANEOUS) IMPLANT
DEFOGGER SCOPE WARMER CLEARIFY (MISCELLANEOUS) ×2 IMPLANT
DERMABOND ADVANCED (GAUZE/BANDAGES/DRESSINGS) ×1
DERMABOND ADVANCED .7 DNX12 (GAUZE/BANDAGES/DRESSINGS) ×1 IMPLANT
DISSECTOR BLUNT TIP ENDO 5MM (MISCELLANEOUS) IMPLANT
DRAIN CHANNEL 28F RND 3/8 FF (WOUND CARE) IMPLANT
DRAIN CHANNEL 32F RND 10.7 FF (WOUND CARE) IMPLANT
DRAPE ARM DVNC X/XI (DISPOSABLE) ×4 IMPLANT
DRAPE COLUMN DVNC XI (DISPOSABLE) ×1 IMPLANT
DRAPE CV SPLIT W-CLR ANES SCRN (DRAPES) ×2 IMPLANT
DRAPE DA VINCI XI ARM (DISPOSABLE) ×8
DRAPE DA VINCI XI COLUMN (DISPOSABLE) ×2
DRAPE ORTHO SPLIT 77X108 STRL (DRAPES) ×2
DRAPE SURG ORHT 6 SPLT 77X108 (DRAPES) ×1 IMPLANT
ELECT BLADE 6.5 EXT (BLADE) IMPLANT
ELECT REM PT RETURN 9FT ADLT (ELECTROSURGICAL) ×2
ELECTRODE REM PT RTRN 9FT ADLT (ELECTROSURGICAL) ×1 IMPLANT
GAUZE KITTNER 4X10 (MISCELLANEOUS) ×2 IMPLANT
GAUZE KITTNER 4X8 (MISCELLANEOUS) IMPLANT
GAUZE SPONGE 4X4 12PLY STRL (GAUZE/BANDAGES/DRESSINGS) ×2 IMPLANT
GLOVE BIO SURGEON STRL SZ7.5 (GLOVE) ×4 IMPLANT
GOWN STRL REUS W/ TWL LRG LVL3 (GOWN DISPOSABLE) ×2 IMPLANT
GOWN STRL REUS W/ TWL XL LVL3 (GOWN DISPOSABLE) ×2 IMPLANT
GOWN STRL REUS W/TWL 2XL LVL3 (GOWN DISPOSABLE) ×2 IMPLANT
GOWN STRL REUS W/TWL LRG LVL3 (GOWN DISPOSABLE) ×4
GOWN STRL REUS W/TWL XL LVL3 (GOWN DISPOSABLE) ×4
HEMOSTAT SURGICEL 2X14 (HEMOSTASIS) ×4 IMPLANT
IRRIGATION STRYKERFLOW (MISCELLANEOUS) IMPLANT
IRRIGATOR STRYKERFLOW (MISCELLANEOUS)
KIT BASIN OR (CUSTOM PROCEDURE TRAY) ×2 IMPLANT
KIT SUCTION CATH 14FR (SUCTIONS) IMPLANT
KIT TURNOVER KIT B (KITS) ×2 IMPLANT
LOOP VESSEL SUPERMAXI WHITE (MISCELLANEOUS) IMPLANT
NEEDLE 22X1 1/2 (OR ONLY) (NEEDLE) ×2 IMPLANT
NS IRRIG 1000ML POUR BTL (IV SOLUTION) ×6 IMPLANT
PACK CHEST (CUSTOM PROCEDURE TRAY) ×2 IMPLANT
PAD ARMBOARD 7.5X6 YLW CONV (MISCELLANEOUS) ×10 IMPLANT
PORT ACCESS TROCAR AIRSEAL 12 (TROCAR) ×2 IMPLANT
PORT ACCESS TROCAR AIRSEAL 5M (TROCAR) ×2
POUCH ENDO CATCH II 15MM (MISCELLANEOUS) IMPLANT
POUCH SPECIMEN RETRIEVAL 10MM (ENDOMECHANICALS) IMPLANT
RELOAD STAPLER 2.5X45 WHT DVNC (STAPLE) ×3 IMPLANT
RELOAD STAPLER 3.5X45 BLU DVNC (STAPLE) ×5 IMPLANT
RELOAD STAPLER 4.3X45 GRN DVNC (STAPLE) ×1 IMPLANT
RETRACTOR WOUND ALXS 19CM XSML (INSTRUMENTS) IMPLANT
RTRCTR WOUND ALEXIS 19CM XSML (INSTRUMENTS)
SCISSORS LAP 5X35 DISP (ENDOMECHANICALS) IMPLANT
SEAL CANN UNIV 5-8 DVNC XI (MISCELLANEOUS) ×2 IMPLANT
SEAL XI 5MM-8MM UNIVERSAL (MISCELLANEOUS) ×4
SEALANT PROGEL (MISCELLANEOUS) IMPLANT
SEALANT SURG COSEAL 4ML (VASCULAR PRODUCTS) IMPLANT
SEALANT SURG COSEAL 8ML (VASCULAR PRODUCTS) IMPLANT
SEALER LIGASURE MARYLAND 30 (ELECTROSURGICAL) IMPLANT
SET TRI-LUMEN FLTR TB AIRSEAL (TUBING) ×2 IMPLANT
SOLUTION ELECTROLUBE (MISCELLANEOUS) IMPLANT
SPONGE INTESTINAL PEANUT (DISPOSABLE) IMPLANT
SPONGE TONSIL TAPE 1 RFD (DISPOSABLE) IMPLANT
STAPLE RELOAD 45 2.0 GRAY (STAPLE) ×2
STAPLE RELOAD 45 2.0 GRAY DVNC (STAPLE) ×1 IMPLANT
STAPLER 45 SUREFORM CVD (STAPLE) ×2
STAPLER 45 SUREFORM CVD DVNC (STAPLE) ×1 IMPLANT
STAPLER CANNULA SEAL DVNC XI (STAPLE) ×2 IMPLANT
STAPLER CANNULA SEAL XI (STAPLE) ×4
STAPLER RELOAD 2.5X45 WHITE (STAPLE) ×6
STAPLER RELOAD 2.5X45 WHT DVNC (STAPLE) ×3
STAPLER RELOAD 3.5X45 BLU DVNC (STAPLE) ×5
STAPLER RELOAD 3.5X45 BLUE (STAPLE) ×10
STAPLER RELOAD 4.3X45 GREEN (STAPLE) ×2
STAPLER RELOAD 4.3X45 GRN DVNC (STAPLE) ×1
STOPCOCK 4 WAY LG BORE MALE ST (IV SETS) ×2 IMPLANT
SUT MNCRL AB 3-0 PS2 18 (SUTURE) IMPLANT
SUT MON AB 2-0 CT1 36 (SUTURE) IMPLANT
SUT PDS AB 1 CTX 36 (SUTURE) IMPLANT
SUT PROLENE 4 0 RB 1 (SUTURE)
SUT PROLENE 4-0 RB1 .5 CRCL 36 (SUTURE) IMPLANT
SUT SILK  1 MH (SUTURE) ×2
SUT SILK 1 MH (SUTURE) ×1 IMPLANT
SUT SILK 1 TIES 10X30 (SUTURE) IMPLANT
SUT SILK 2 0 SH (SUTURE) IMPLANT
SUT SILK 2 0SH CR/8 30 (SUTURE) IMPLANT
SUT VIC AB 1 CTX 36 (SUTURE)
SUT VIC AB 1 CTX36XBRD ANBCTR (SUTURE) IMPLANT
SUT VIC AB 2-0 CT1 27 (SUTURE) ×2
SUT VIC AB 2-0 CT1 TAPERPNT 27 (SUTURE) ×1 IMPLANT
SUT VIC AB 3-0 SH 27 (SUTURE) ×4
SUT VIC AB 3-0 SH 27X BRD (SUTURE) ×2 IMPLANT
SUT VICRYL 0 TIES 12 18 (SUTURE) ×2 IMPLANT
SUT VICRYL 0 UR6 27IN ABS (SUTURE) ×4 IMPLANT
SUT VICRYL 2 TP 1 (SUTURE) IMPLANT
SYR 10ML LL (SYRINGE) ×2 IMPLANT
SYR 20ML LL LF (SYRINGE) ×2 IMPLANT
SYR 50ML LL SCALE MARK (SYRINGE) ×2 IMPLANT
SYSTEM RETRIEVAL ANCHOR 15 (MISCELLANEOUS) ×2 IMPLANT
SYSTEM SAHARA CHEST DRAIN ATS (WOUND CARE) ×2 IMPLANT
TAPE CLOTH 4X10 WHT NS (GAUZE/BANDAGES/DRESSINGS) ×2 IMPLANT
TAPE CLOTH SURG 4X10 WHT LF (GAUZE/BANDAGES/DRESSINGS) ×2 IMPLANT
TIP APPLICATOR SPRAY EXTEND 16 (VASCULAR PRODUCTS) IMPLANT
TOWEL GREEN STERILE (TOWEL DISPOSABLE) ×2 IMPLANT
TRAY FOLEY MTR SLVR 16FR STAT (SET/KITS/TRAYS/PACK) ×2 IMPLANT
TUBING EXTENTION W/L.L. (IV SETS) ×2 IMPLANT

## 2020-12-24 NOTE — Anesthesia Procedure Notes (Signed)
Procedure Name: Intubation Date/Time: 12/24/2020 7:48 AM Performed by: Georgia Duff, CRNA Pre-anesthesia Checklist: Patient identified, Emergency Drugs available, Suction available and Patient being monitored Patient Re-evaluated:Patient Re-evaluated prior to induction Oxygen Delivery Method: Circle System Utilized Preoxygenation: Pre-oxygenation with 100% oxygen Induction Type: IV induction Ventilation: Mask ventilation without difficulty Laryngoscope Size: 3 and Miller Grade View: Grade I Tube type: Oral Tube size: 7.5 mm Number of attempts: 1 Airway Equipment and Method: Stylet and Oral airway Placement Confirmation: ETT inserted through vocal cords under direct vision,  positive ETCO2 and breath sounds checked- equal and bilateral Secured at: 23 cm Tube secured with: Tape Dental Injury: Teeth and Oropharynx as per pre-operative assessment

## 2020-12-24 NOTE — Addendum Note (Signed)
Addendum  created 12/24/20 1306 by Georgia Duff, CRNA   Charge Capture section accepted

## 2020-12-24 NOTE — Plan of Care (Signed)

## 2020-12-24 NOTE — Transfer of Care (Signed)
Immediate Anesthesia Transfer of Care Note  Patient: [name]  Procedure(s) Performed: XI ROBOTIC ASSISTED THORASCOPY-RIGHT UPPER LOBECTOMY (Right Chest) INTERCOSTAL NERVE BLOCK (Right Chest) NODE DISSECTION (Right Chest)  Patient Location: PACU  Anesthesia Type:General  Level of Consciousness: awake and patient cooperative  Airway & Oxygen Therapy: Patient Spontanous Breathing  Post-op Assessment: Report given to RN and Post -op Vital signs reviewed and stable  Post vital signs: Reviewed and stable  Last Vitals:  Vitals Value Taken Time  BP 128/70 12/24/20 1137  Temp    Pulse 73 12/24/20 1140  Resp 14 12/24/20 1140  SpO2 100 % 12/24/20 1140  Vitals shown include unvalidated device data.  Last Pain:  Vitals:   12/24/20 0654  TempSrc:   PainSc: 2       Patients Stated Pain Goal: 3 (95/28/41 3244)  Complications: No complications documented.

## 2020-12-24 NOTE — Brief Op Note (Signed)
12/24/2020  11:16 AM  PATIENT:  [name]  65 y.o. male  PRE-OPERATIVE DIAGNOSIS:  PULMONARY NODULE  POST-OPERATIVE DIAGNOSIS:  PULMONARY NODULE  PROCEDURE: XI ROBOTIC ASSISTED THORASCOPY-RIGHT UPPER LOBECTOMY (Right) INTERCOSTAL NERVE BLOCK (Right) NODE DISSECTION (Right)  SURGEON:  [name], MD   PHYSICIAN ASSISTANT: Ellianne Gowen  ANESTHESIA:   general  EBL:  <21ml   BLOOD ADMINISTERED:none  DRAINS: Right 17fr straight pleural drain  LOCAL MEDICATIONS USED:  Exparel intercostal and peri-incisional  SPECIMEN:  Source of Specimen:  Right upper lung lobe and multiple mediastinal lymph nodes  DISPOSITION OF SPECIMEN:  PATHOLOGY  COUNTS:  YES  DICTATION: .Dragon Dictation  PLAN OF CARE: Admit to inpatient   PATIENT DISPOSITION:  PACU - hemodynamically stable.   Delay start of Pharmacological VTE agent (>24hrs) due to surgical blood loss or risk of bleeding: yes

## 2020-12-24 NOTE — Anesthesia Postprocedure Evaluation (Signed)
Anesthesia Post Note  Patient: [name]  Procedure(s) Performed: VIDEO BRONCHOSCOPY WITH ENDOBRONCHIAL NAVIGATION (Right ) VIDEO BRONCHOSCOPY WITH ENDOBRONCHIAL ULTRASOUND (N/A ) BRONCHIAL BRUSHINGS BRONCHIAL NEEDLE ASPIRATION BIOPSIES BRONCHIAL BIOPSIES FIDUCIAL DYE MARKER PLACEMENT     Patient location during evaluation: PACU Anesthesia Type: General Level of consciousness: awake and alert Pain management: pain level controlled Vital Signs Assessment: post-procedure vital signs reviewed and stable Respiratory status: spontaneous breathing, nonlabored ventilation, respiratory function stable and patient connected to nasal cannula oxygen Cardiovascular status: blood pressure returned to baseline and stable Postop Assessment: no apparent nausea or vomiting Anesthetic complications: no   No complications documented.  Last Vitals:  Vitals:   12/24/20 1207 12/24/20 1221  BP: 116/76 128/76  Pulse: 68 69  Resp: 15 12  Temp: 36.7 C   SpO2: 97% 98%    Last Pain:  Vitals:   12/24/20 1203  TempSrc:   PainSc: 5                  Tiajuana Amass

## 2020-12-24 NOTE — Op Note (Signed)
[address]       [place] [address]             [phone]        12/24/2020  Patient:  [name] Pre-Op Dx: Right upper lobe pulmonary nodule Post-op Dx: Right upper lobe poorly differentiated carcinoma Procedure: - Robotic assisted right video thoracoscopy -Right upper lobectomy - Mediastinal lymph node sampling - Intercostal nerve block  Surgeon and Role:      * [name], MD - Primary    *M. Roddenberry, PA-C- assisting  Anesthesia  general EBL: Minimal  Blood Administration: None Specimen: Right upper lobe, hilar lymph nodes, level 9 lymph node  Drains: 28 F argyle chest tube in right chest Counts: correct   Indications: 65 year old male with a 2 cm right upper lobe pulmonary nodule very concerning for primary lung cancer.  I reviewed his current and past images, and cross-sectional imaging from 2021 did not show this nodule.  He additionally has some mild uptake in the right hilum and GE junction.  I think that the GE junction avidity is most likely related to his eosinophilic esophagitis, and this can be evaluated following treatment for this pulmonary nodule.  We discussed multiple options of obtaining a biopsy and we have agreed to proceed with a combination procedure with Dr. Valeta Harms in which she will perform a navigational bronchoscopy.  If this shows non-small cell lung cancer then we will proceed with a robotic assisted right thoracoscopy and right upper lobectomy.  He is on Plavix and this will need to be held for 5 days prior to surgery.  Findings: The biopsy from the navigational bronchoscopy revealed poorly differentiated carcinoma.  We were unable to differentiate between small cell and non-small cell but given his preoperative work-up we elected to proceed with the right upper lobectomy.  Hilar lymph nodes are somewhat concerning.  Otherwise normal anatomy.  Operative Technique: After the risks, benefits and  alternatives were thoroughly discussed, the patient was brought to the operative theatre.  Anesthesia was induced, and the patient was then placed in a left lateral decubitus position and was prepped and draped in normal sterile fashion.  An appropriate surgical pause was performed, and pre-operative antibiotics were dosed accordingly.  We began by placing our 4 robotic ports in the the 7th intercostal space targeting the hilum of the lung.  A 12mm assistant port was placed in the 9th intercostal space in the anterior axillary line.  The robot was then docked and all instruments were passed under direct visualization.    The lung was then retracted superiorly, and the inferior pulmonary ligament was divided.  The hilum was mobilized anteriorly and posteriorly.  We identified the upper lobe pulmonary vein, and after careful isolation, it was divided with a vascular stapler.  We next moved to the pulmonary artery to the upper lobe.  The artery was then divided with a vascular load stapler.  The bronchus to the upper lobe was then isolated.  After a test clamp, with good ventilation of the middle and lower lobes, the bronchus was then divided.  The fissure was completed, and the specimen was passed into an endocatch bag.  It was removed from the anterior access site.    Lymph nodes were then sampled at levels level 9 and hilum.  There was no significant lymphadenopathy at level 7 or 4..  The chest was irrigated, and an air leak test was performed.  An  intercostal nerve block was performed under direct visualization.  A 28 F chest with then placed, and we watch the remaining lobes re-expand.  The skin and soft tissue were closed with absorbable suture    The patient tolerated the procedure without any immediate complications, and was transferred to the PACU in stable condition.  [name]

## 2020-12-24 NOTE — Transfer of Care (Signed)
Immediate Anesthesia Transfer of Care Note  Patient: [name]  Procedure(s) Performed: VIDEO BRONCHOSCOPY WITH ENDOBRONCHIAL NAVIGATION (Right ) VIDEO BRONCHOSCOPY WITH ENDOBRONCHIAL ULTRASOUND (N/A ) BRONCHIAL BRUSHINGS BRONCHIAL NEEDLE ASPIRATION BIOPSIES BRONCHIAL BIOPSIES FIDUCIAL DYE MARKER PLACEMENT  Patient Location: PACU  Anesthesia Type:General  Level of Consciousness: awake and patient cooperative  Airway & Oxygen Therapy: Patient Spontanous Breathing  Post-op Assessment: Report given to RN  Post vital signs: Reviewed  Last Vitals:  Vitals Value Taken Time  BP 128/70 12/24/20 1137  Temp    Pulse 69 12/24/20 1141  Resp 16 12/24/20 1141  SpO2 97 % 12/24/20 1141  Vitals shown include unvalidated device data.  Last Pain:  Vitals:   12/24/20 0654  TempSrc:   PainSc: 2       Patients Stated Pain Goal: 3 (14/97/02 6378)  Complications: No complications documented.

## 2020-12-24 NOTE — Interval H&P Note (Signed)
History and Physical Interval Note:  12/24/2020 7:33 AM  [name]  has presented today for surgery, with the diagnosis of PULMONARY NODULE.  The various methods of treatment have been discussed with the patient and family. After consideration of risks, benefits and other options for treatment, the patient has consented to  Procedure(s): XI ROBOTIC ASSISTED THORASCOPY-RIGHT UPPER LOBECTOMY (Right) as a surgical intervention.  The patient's history has been reviewed, patient examined, no change in status, stable for surgery.  I have reviewed the patient's chart and labs.  Questions were answered to the patient's satisfaction.     [name] Bary Leriche

## 2020-12-24 NOTE — Anesthesia Procedure Notes (Addendum)
Procedure Name: Intubation Date/Time: 12/24/2020 8:40 AM Performed by: Georgia Duff, CRNA Pre-anesthesia Checklist: Patient identified, Emergency Drugs available, Suction available and Patient being monitored Patient Re-evaluated:Patient Re-evaluated prior to induction Oxygen Delivery Method: Circle System Utilized Preoxygenation: Pre-oxygenation with 100% oxygen Laryngoscope Size: Glidescope and 4 Grade View: Grade I Tube type: Oral Endobronchial tube: Left and EBT position confirmed by fiberoptic bronchoscope and 39 Fr Tube size: 8.5 mm Number of attempts: 1 Airway Equipment and Method: Stylet,  Oral airway and Bougie stylet Placement Confirmation: ETT inserted through vocal cords under direct vision,  positive ETCO2 and breath sounds checked- equal and bilateral Tube secured with: Tape Dental Injury: Teeth and Oropharynx as per pre-operative assessment  Comments: Exchanged 8.5 ETT foe 39Fr DL tube under video laryngoscopy with an exchange catheter.  Visualized tube exchange through glottis and confirmed placement with bronchoscope.

## 2020-12-25 ENCOUNTER — Encounter (HOSPITAL_COMMUNITY): Payer: Self-pay | Admitting: Thoracic Surgery (Cardiothoracic Vascular Surgery)

## 2020-12-25 ENCOUNTER — Inpatient Hospital Stay (HOSPITAL_COMMUNITY): Payer: Medicare Other

## 2020-12-25 LAB — BASIC METABOLIC PANEL
Anion gap: 8 (ref 5–15)
BUN: 22 mg/dL (ref 8–23)
CO2: 23 mmol/L (ref 22–32)
Calcium: 8.7 mg/dL — ABNORMAL LOW (ref 8.9–10.3)
Chloride: 103 mmol/L (ref 98–111)
Creatinine, Ser: 1.35 mg/dL — ABNORMAL HIGH (ref 0.61–1.24)
GFR, Estimated: 58 mL/min — ABNORMAL LOW (ref >60)
Glucose, Bld: 148 mg/dL — ABNORMAL HIGH (ref 70–99)
Potassium: 4.2 mmol/L (ref 3.5–5.1)
Sodium: 134 mmol/L — ABNORMAL LOW (ref 135–145)

## 2020-12-25 LAB — CBC
HCT: 38 % — ABNORMAL LOW (ref 39.0–52.0)
Hemoglobin: 12.4 g/dL — ABNORMAL LOW (ref 13.0–17.0)
MCH: 30 pg (ref 26.0–34.0)
MCHC: 32.6 g/dL (ref 30.0–36.0)
MCV: 92 fL (ref 80.0–100.0)
Platelets: 190 10*3/uL (ref 150–400)
RBC: 4.13 MIL/uL — ABNORMAL LOW (ref 4.22–5.81)
RDW: 13 % (ref 11.5–15.5)
WBC: 17.2 10*3/uL — ABNORMAL HIGH (ref 4.0–10.5)
nRBC: 0 % (ref 0.0–0.2)

## 2020-12-25 MED ORDER — OXYCODONE-ACETAMINOPHEN 5-325 MG PO TABS
1.0000 | ORAL_TABLET | ORAL | Status: DC | PRN
  Administered 2020-12-25 – 2021-01-06 (×26): 1 via ORAL
  Filled 2020-12-25 (×26): qty 1

## 2020-12-25 MED ORDER — ALUM HYDROXIDE-MAG CARBONATE 95-358 MG/15ML PO SUSP
30.0000 mL | Freq: Three times a day (TID) | ORAL | Status: DC | PRN
  Filled 2020-12-25: qty 30

## 2020-12-25 MED ORDER — ALUM & MAG HYDROXIDE-SIMETH 200-200-20 MG/5ML PO SUSP
30.0000 mL | Freq: Four times a day (QID) | ORAL | Status: DC | PRN
  Administered 2020-12-26 – 2021-01-04 (×8): 30 mL via ORAL
  Filled 2020-12-25 (×9): qty 30

## 2020-12-25 NOTE — Progress Notes (Addendum)
      [address]       [place] [address]             [phone]       1 Day Post-Op Procedure(s) (LRB): XI ROBOTIC ASSISTED THORASCOPY-RIGHT UPPER LOBECTOMY (Right) INTERCOSTAL NERVE BLOCK (Right) NODE DISSECTION (Right)  Subjective: Patient with complaints of incisional pain this am. He denies nausea.  Objective: Vital signs in last 24 hours: Temp:  [97.9 F (36.6 C)-98.6 F (37 C)] 98 F (36.7 C) (04/28 0400) Pulse Rate:  [65-87] 81 (04/28 0400) Cardiac Rhythm: Normal sinus rhythm (04/28 0400) Resp:  [12-20] 17 (04/28 0400) BP: (98-157)/(56-103) 120/72 (04/28 0400) SpO2:  [94 %-100 %] 94 % (04/28 0400)      Intake/Output from previous day: 04/27 0701 - 04/28 0700 In: 1600 [I.V.:1500; IV Piggyback:100] Out: 1575 [Urine:1375; Blood:50; Chest Tube:150]   Physical Exam:  Cardiovascular: RRR Pulmonary: Clear to auscultation on the left and coarse on the right Abdomen: Soft, non tender, bowel sounds present. Extremities: SCDs in place Wounds: Clean and dry.  No erythema or signs of infection. Chest Tube: to water seal, +++ air leak with cough  Lab Results: TWS:FKCLEX Labs    12/22/20 1430 12/25/20 0041  WBC 12.1* 17.2*  HGB 14.9 12.4*  HCT 45.1 38.0*  PLT 272 190   BMET:  Recent Labs    12/22/20 1430 12/25/20 0041  NA 135 134*  K 4.1 4.2  CL 105 103  CO2 21* 23  GLUCOSE 105* 148*  BUN 21 22  CREATININE 1.13 1.35*  CALCIUM 9.5 8.7*    PT/INR:  Recent Labs    12/22/20 1430  LABPROT 13.3  INR 1.0   ABG:  INR: Will add last result for INR, ABG once components are confirmed Will add last 4 CBG results once components are confirmed  Assessment/Plan:  1. CV - SR with HR in the 80's. On Amlodipine 10 mg daily, Catapres 0.1 mg at hs, Plavix 75 mg daily, and Toprol XL 25 mg daily. 2.  Pulmonary - On room air. Chest tube with 150 cc of output since surgery. Chest tube is to water seal and there is a  +++ air leak with cough.   CXR this am shows no pneumothorax. Chest tube to remain for now. Encourage incentive spirometer 3. Anemia-H and H this am 12.4 and 38. 4. Creatinine 1.35. On Toradol and Lisinopril. Stop Lisinopril. Recheck in am 5. Regarding pain control, On scheduled Toradol, Dilaudid PRN, and Ultram. Will not change to Oxy as had previous aggressive behavior with narcotics.  Donielle M ZimmermanPA-C 12/25/2020,7:04 AM [phone]  Agree with above Doing well Small lea on exam Good lung expansion on xray Continue pulm toilet Ambulation today.  Travarius Lange Bary Leriche

## 2020-12-25 NOTE — Discharge Summary (Signed)
Physician Discharge Summary       [address]       ,[place] [address]             [phone]    Patient ID: [name] MRN: [id number] DOB/AGE: 66/07/1956 66 y.o.  Admit date: 12/24/2020 Discharge date: 01/07/2021  Admission Diagnoses: Right upper lobe pulmonary nodule  Discharge Diagnoses:  1.  S/P robotic assisted right video thoracoscopy, right upper lobectomy, mediastinal lymph node sampling, and intercostal nerve block 2. History of the following:  Date  . Anxiety   . Arthritis   . Basal cell carcinoma (BCC) of forehead   . CAD (coronary artery disease)    a. 10/2015 ant STEMI >> LHC with 3 v CAD; oLAD tx with POBA >> emergent CABG. b. Multiple evals since that time, early graft failure of SVG-RCA by cath 03/2016. c. 2/19 PCI/DES x1 to pRCA, normal EF.  . Carotid artery disease ([place])    a. 40-59% BICA 02/2018.  Marland Kitchen Depression   . Dyspnea   . Ectopic atrial tachycardia ([place])   . Esophageal reflux    eosinophil esophagitis  . Family history of adverse reaction to anesthesia    "sister has PONV" (06/21/2017)  . Former tobacco use   . Gout   . Hepatitis C    "treated and cured" (06/21/2017)  . High cholesterol   . History of kidney stones   . Hypertension   . Ischemic cardiomyopathy    a. EF 25-30% at intraop TEE 4/17  //  b. Limited Echo 5/17 - EF 45-50%, mild ant HK. c. EF 55-65% by cath 09/2017.  . Migraine    "3-4/yr" (06/21/2017)  . Myocardial infarction ([place]) 10/2015  . Palpitations   . Sinus bradycardia    a. HR dropping into 40s in 02/2016 -> BB reduced.  . Stroke ([place]) 10/2016   "small one; sometimes my memory/cognitive issues" (06/21/2017)  . Symptomatic hypotension    a. 02/2016 ER visit -> meds reduced.  . Syncope     Consults: Cardiology  Procedure (s):  - Robotic assisted right video thoracoscopy -Right upper lobectomy - Mediastinal lymph node sampling - Intercostal nerve block by Dr.  Kipp Brood on 12/24/2020.  Placement of bronchial valves by Dr. Kipp Brood on 12/31/2020.  Pathology:  FINAL MICROSCOPIC DIAGNOSIS:   A. LYMPH NODE, 9, EXCISION:  - Lymph node negative for metastatic carcinoma (0/1).   B. LYMPH NODE, HILAR, EXCISION:  - Lymph node negative for metastatic carcinoma (0/1).   C. LYMPH NODE, HILAR #2, EXCISION:  - Lymph node positive for metastatic carcinoma (1/1).   D. LYMPH NODE, HILAR #3, EXCISION:  - Lymph node positive for metastatic carcinoma (1/1).   E. LYMPH NODE, HILAR #4, EXCISION:  - Lymph node positive for metastatic carcinoma (1/1).   F. LUNG, RIGHT UPPER LOBE, LOBECTOMY:  - Poorly differentiated adenocarcinoma, 2.4 cm.  - Margins not involved.  - No pleural invasion.  - See oncology table.   ONCOLOGY TABLE:  LUNG: Resection  Total Number of Primary Tumors: One.  Procedure: Right upper lobectomy and lymph node biopsies.  Specimen Laterality: Right.  Tumor Focality: Unifocal.  Tumor Site: Right upper lobe.  Tumor Size: 2.4 x 2 x 1.9 cm.  Histologic Type: Poorly differentiated adenocarcinoma.  Visceral Pleura Invasion: Not identified.  Direct Invasion of Adjacent Structures: No adjacent structures present.  Lymphovascular Invasion: Present.  Margins: All margins negative for carcinoma.  Pathologic Stage Classification (pTNM, AJCC 8th Edition): pT1c, pN1  History of Presenting Illness: [name] 65 y.o. male referred by Dr. Valeta Harms for surgical evaluation of a right upper lobe pulmonary nodule.  This was found incidentally when he was being worked up for chest pain.  He does have a history of coronary artery bypass grafting performed by Dr. Caffie Pinto in 2017.  Following that date he has been tobacco free.  He was infected with Covid late last year and had symptoms for over 2 months.  He continues to have some pleuritic chest pain and mental fog.   Smoking Hx: Quit in 2017  Dr. Kipp Brood discussed the need for a right  thoracoscopy and right upper lobectomy. Potential risks, benefits, and complications of the surgery were discussed with the patient and he agreed to proceed with surgery. He underwent the aforementioned surgery on 12/24/2020.  Brief Hospital Course:  Patient has remained afebrile and hemodynamically stable. His creatinine was slightly elevated post op 1.31 on 04/28. He was continued on scheduled Toradol but Lisinopril was stopped. Follow up creatinine was 0.99. He was restarted on baby ec asa and Plavix as well as Amlodipine and Toprol XL as taken prior to surgery.  He then went into a fib with RVR. He was given IV Amiodarone bolus followed by oral Amiodarone. He converted to sinus rhythm. Cardiology was consulted. It was recommended patient have a Zio patch placed (and this was arranged) as well as because of elevated CHA2DS2-VASc score, he may need to be started on anticoagulation. Chest tube was to water seal post op. He did have a persistent air leak with cough. Daily chest x rays were obtained and remained stable. Patient underwent interbronchial valves for persistent air leak. He was progressed to water seal on 5/9 after being on suction all weekend. His chest tube was removed on 5/10. Follow-up CXR showed: Interval removal of right chest tube with stable small right apical pneumothorax. Patient has been ambulating on room air with good oxygenation. He has been tolerating a diet. His wounds are clean, dry, and healing without signs of infection. He is felt surgically stable for discharge today. He knows he will need to wean himself off the pain medication over the next week or so.    Latest Vital Signs: Blood pressure 109/71, pulse 82, temperature 98.3 F (36.8 C), temperature source Oral, resp. rate 19, height 5\' 9"  (1.753 m), weight 88.5 kg, SpO2 95 %.  Physical Exam:  General appearance: alert, cooperative and no distress Heart: regular rate and rhythm, S1, S2 normal, no murmur, click, rub or  gallop Lungs: wheezes right upper lobe Abdomen: soft, non-tender; bowel sounds normal; no masses,  no organomegaly Extremities: extremities normal, atraumatic, no cyanosis or edema Wound: clean and dry  Discharge Condition: Stable and discharged to home.  Recent laboratory studies:  Lab Results  Component Value Date   WBC 10.0 12/30/2020   HGB 11.6 (L) 12/30/2020   HCT 35.4 (L) 12/30/2020   MCV 91.9 12/30/2020   PLT 236 12/30/2020   Lab Results  Component Value Date   NA 135 12/28/2020   K 4.1 12/28/2020   CL 102 12/28/2020   CO2 26 12/28/2020   CREATININE 1.13 12/28/2020   GLUCOSE 114 (H) 12/28/2020      Diagnostic Studies: DG Chest 1 View  Result Date: 01/06/2021 CLINICAL DATA:  Chest tube clamped EXAM: CHEST  1 VIEW COMPARISON:  Jan 06, 2021 study obtained earlier in the day FINDINGS: Chest tube position unchanged. Pneumothorax on the right may be slightly smaller  than on earlier in the day. Subcutaneous air again noted on the right. There is mild scarring in the right base medially. There is equivocal right pleural effusion. No edema or airspace opacity. Heart size and pulmonary vascularity are normal. Status post coronary artery bypass grafting. Postoperative change in lower cervical spine. No bone lesions. IMPRESSION: Chest tube again noted on the right with right apical pneumothorax slightly smaller compared to earlier in the day. Equivocal right pleural effusion with mild scarring right base. No edema or airspace opacity. Stable cardiac silhouette. Electronically Signed   By: Lowella Grip III M.D.   On: 01/06/2021 14:02   DG Chest 2 View  Result Date: 01/01/2021 CLINICAL DATA:  Chest pain.  Recent right upper lobectomy EXAM: CHEST - 2 VIEW COMPARISON:  Dec 31, 2020 and Dec 30, 2020 FINDINGS: Chest tube is again noted on the right with essentially stable right apical pneumothorax. No tension component. Postoperative changes noted on the right medially, stable. No edema or  airspace opacity. Heart is upper normal in size. The pulmonary vascularity is stable with postoperative change on the right and normal appearance on the left. Evidence of previous coronary artery bypass grafting. No adenopathy. Postoperative change at cervicothoracic junction. There is aortic atherosclerosis. IMPRESSION: Chest tube on the right again noted with stable right apical pneumothorax. No tension component. Postoperative changes on the right. No edema or airspace opacity. Stable cardiac silhouette. Aortic Atherosclerosis (ICD10-I70.0). Electronically Signed   By: Lowella Grip III M.D.   On: 01/01/2021 08:39   DG Chest 2 View  Result Date: 12/22/2020 CLINICAL DATA:  Preop for robot assisted thoracoscopy. Hypertension. EXAM: CHEST - 2 VIEW COMPARISON:  12/12/2020 CT. FINDINGS: Prior median sternotomy.  Cervical spine fixation. Midline trachea. Normal heart size. Atherosclerosis in the transverse aorta. No pleural effusion or pneumothorax. Right upper lobe 2.2 cm pulmonary nodule again identified. No lobar consolidation or other acute superimposed process. IMPRESSION: Right upper lobe pulmonary nodule, as before. Aortic Atherosclerosis (ICD10-I70.0). Electronically Signed   By: Abigail Miyamoto M.D.   On: 12/22/2020 14:29   DG Chest Port 1 View  Result Date: 01/07/2021 CLINICAL DATA:  Chest tube removed EXAM: PORTABLE CHEST 1 VIEW COMPARISON:  01/06/2021 FINDINGS: Interval removal of right chest tube. Stable small right apical pneumothorax. Subcutaneous emphysema in the right chest wall is stable. No confluent opacities or effusions. Heart is normal size. IMPRESSION: Interval removal of right chest tube with stable small right apical pneumothorax. Electronically Signed   By: Rolm Baptise M.D.   On: 01/07/2021 08:46   DG Chest Port 1 View  Result Date: 01/06/2021 CLINICAL DATA:  Chest tube, pneumothorax EXAM: PORTABLE CHEST 1 VIEW COMPARISON:  01/05/2021 FINDINGS: Right chest tube remains in place,  unchanged. Small right apical pneumothorax, stable since prior study. Prior CABG. No confluent opacities or effusions. Heart is normal size. IMPRESSION: Small right apical pneumothorax right chest tube in place. No significant change since prior study. Electronically Signed   By: Rolm Baptise M.D.   On: 01/06/2021 08:25   DG CHEST PORT 1 VIEW  Result Date: 01/05/2021 CLINICAL DATA:  Pneumothorax, chest tube EXAM: PORTABLE CHEST 1 VIEW COMPARISON:  01/04/2021 FINDINGS: Right apical pneumothorax remains present and without substantial change. Chest tube is present. No new findings. Stable heart size. Residual right chest wall emphysema. IMPRESSION: Stable small right apical pneumothorax with chest tube present. Electronically Signed   By: Macy Mis M.D.   On: 01/05/2021 08:14   DG CHEST PORT 1 VIEW  Result Date: 01/04/2021 CLINICAL DATA:  Follow-up pneumothorax and chest tube. EXAM: PORTABLE CHEST 1 VIEW COMPARISON:  01/03/2021 and older exams. FINDINGS: Interval decrease in the size of the right pneumothorax, now small, noted at the right apex. Stable right chest tube. Linear opacities at the medial right lung base are stable consistent with atelectasis. Left lung is hyperexpanded but clear. IMPRESSION: 1. Interval decrease in the size of the right pneumothorax, now small. 2. No other change.  Stable right chest tube. Electronically Signed   By: Lajean Manes M.D.   On: 01/04/2021 08:04   DG CHEST PORT 1 VIEW  Result Date: 01/03/2021 CLINICAL DATA:  Pneumothorax. EXAM: PORTABLE CHEST 1 VIEW COMPARISON:  Chest radiograph dated 01/02/2021. FINDINGS: The heart size is normal. Vascular calcifications are seen in the aortic arch. A moderate right pneumothorax appears similar to prior exam. There is no left pneumothorax and the left lung is clear. There is no pleural effusion on either side. A right-sided pleural pigtail catheter is unchanged in position. Spinal fixation hardware and median sternotomy wires  are redemonstrated. IMPRESSION: Unchanged moderate right pneumothorax with right-sided pigtail pleural catheter in place. Electronically Signed   By: Zerita Boers M.D.   On: 01/03/2021 11:53   DG CHEST PORT 1 VIEW  Result Date: 01/02/2021 CLINICAL DATA:  Chest tube present EXAM: PORTABLE CHEST 1 VIEW COMPARISON:  Jan 01, 2021 FINDINGS: Right-sided chest tube present. Right apical region pneumothorax appears slightly smaller compared to 1 day prior. No tension component. There is subcutaneous air on the right. There is no edema or airspace opacity. The heart size and pulmonary vascularity normal. No adenopathy. There is aortic atherosclerosis. Postoperative changes bilaterally with endobronchial valves noted on the right. Postoperative change noted at cervicothoracic junction region. IMPRESSION: Right-sided pneumothorax slightly smaller compared to 1 day prior with chest tube on the right again noted. No tension component. Subcutaneous air again noted on the right. No edema or airspace opacity. Postoperative changes with endobronchial valves noted on the right. Stable cardiac silhouette. Aortic Atherosclerosis (ICD10-I70.0). Electronically Signed   By: Lowella Grip III M.D.   On: 01/02/2021 08:57   DG CHEST PORT 1 VIEW  Result Date: 01/01/2021 CLINICAL DATA:  Follow-up right pneumothorax EXAM: PORTABLE CHEST 1 VIEW COMPARISON:  Film from earlier in the same day. FINDINGS: Cardiac shadows within normal limits. Postsurgical changes are seen. Endobronchial valves are again noted on the right. Pigtail catheter is seen with a right-sided pneumothorax which has increased in the interval from the prior exam. IMPRESSION: Interval increase in right-sided pneumothorax with approximately 7 cm of excursion from the apex increased from 4 cm on the previous exam. These results will be called to the ordering clinician or representative by the Radiologist Assistant, and communication documented in the PACS or [employer]. Electronically Signed   By: Inez Catalina M.D.   On: 01/01/2021 14:03   DG CHEST PORT 1 VIEW  Result Date: 12/31/2020 CLINICAL DATA:  Status post lobectomy EXAM: PORTABLE CHEST 1 VIEW COMPARISON:  12/31/2020, 12/30/2020, 12/28/2020, CT 12/12/2020 FINDINGS: Post sternotomy changes. New right-sided chest drainage catheter with pigtail at the right apex. Small residual right apical pneumothorax, probably grossly unchanged allowing for non inclusion of the extreme apices. Postsurgical changes at the right hilus with interval radiopaque devices at the right hilus. Stable cardiomediastinal silhouette IMPRESSION: 1. Status post right upper lobectomy with placement of new right upper chest drainage catheter. Small residual right apical pneumothorax, probably not significantly changed. Electronically Signed  By: Donavan Foil M.D.   On: 12/31/2020 18:47   DG CHEST PORT 1 VIEW  Result Date: 12/31/2020 CLINICAL DATA:  Chest tube.  Pneumothorax. EXAM: PORTABLE CHEST 1 VIEW COMPARISON:  12/30/2020. FINDINGS: Right chest tube in stable position. Stable right apical pneumothorax. Prior CABG. Heart size normal. No focal infiltrate. Mild right base subsegmental atelectasis again noted. Prior cervical spine fusion. IMPRESSION: 1. Right chest tube in stable position. Stable right apical pneumothorax. 2.  Prior CABG.  Heart size normal. 3.  Mild right base subsegmental atelectasis again noted. Electronically Signed   By: Marcello Moores  Register   On: 12/31/2020 06:55   DG CHEST PORT 1 VIEW  Result Date: 12/30/2020 CLINICAL DATA:  Chest tube.  Pneumothorax.  Pulmonary nodule. EXAM: PORTABLE CHEST 1 VIEW COMPARISON:  12/28/2020. FINDINGS: Right chest tube in stable position. Stable right apical pneumothorax. Prior CABG. Heart size stable. Mild left base subsegmental atelectasis. No pleural effusion or pneumothorax. Prior cervicothoracic fusion. IMPRESSION: 1. Right chest tube in stable position. Stable right apical  pneumothorax. 2.  Prior CABG.  Heart size stable. 3.  Mild right base subsegmental atelectasis. Electronically Signed   By: Marcello Moores  Register   On: 12/30/2020 07:57   DG CHEST PORT 1 VIEW  Result Date: 12/28/2020 CLINICAL DATA:  Right upper lobectomy. EXAM: PORTABLE CHEST 1 VIEW COMPARISON:  Multiple chest x-rays since December 22, 2020. FINDINGS: The right chest tube is stable. There may be a pleural stripe seen at the right apex, possibly representing a pneumothorax. Lung markings are seen on both sides however suggesting the pneumothorax could either be anteriorly or posteriorly located. Recommend continued attention on follow-up. CT imaging could better delineate if clinically warranted. Air in the right chest wall stable. The heart, hila, and lungs are otherwise unchanged and unremarkable. IMPRESSION: 1. A right chest tube remains in place. 2. There may be and anterior or posterior pneumothorax at the apex with a possible pleural stripe. If this represents a pneumothorax, it measures approximately 4.2 cm at the apex. This may have been present yesterday in retrospect measuring 3 cm. Recommend attention on follow-up. CT imaging could better delineate if clinically warranted. These results will be called to the ordering clinician or representative by the Radiologist Assistant, and communication documented in the PACS or [employer]. Electronically Signed   By: Dorise Bullion III M.D   On: 12/28/2020 08:39   DG CHEST PORT 1 VIEW  Result Date: 12/27/2020 CLINICAL DATA:  Right upper lobectomy EXAM: PORTABLE CHEST 1 VIEW COMPARISON:  12/26/2020 FINDINGS: Right apically directed chest tube. No definite pneumothorax. Stable mild elevation of the right hemidiaphragm secondary to volume loss. No new consolidation or edema. Stable cardiomediastinal contours. Post CABG. IMPRESSION: Right apical chest tube.  No pneumothorax.  Stable lung aeration. Electronically Signed   By: Macy Mis M.D.   On: 12/27/2020  12:37   DG CHEST PORT 1 VIEW  Result Date: 12/26/2020 CLINICAL DATA:  Chest tube in place for pneumothorax EXAM: PORTABLE CHEST 1 VIEW COMPARISON:  December 26, 2020 study obtained earlier in the day. FINDINGS: Chest tube position unchanged. No evident pneumothorax. There is slight left base atelectasis. Lungs elsewhere are clear. Heart size and pulmonary vascularity are normal. Patient is status post internal mammary bypass grafting. There is aortic atherosclerosis. No adenopathy. There is postoperative change in the lower cervical region. IMPRESSION: Chest tube on the right, unchanged. No pneumothorax. Mild left base atelectasis. Lungs otherwise clear. Stable cardiac silhouette. Aortic Atherosclerosis (ICD10-I70.0). Electronically Signed  By: Lowella Grip III M.D.   On: 12/26/2020 13:22   DG CHEST PORT 1 VIEW  Result Date: 12/26/2020 CLINICAL DATA:  Chest tube in place for recent pneumothorax EXAM: PORTABLE CHEST 1 VIEW COMPARISON:  December 25, 2020 FINDINGS: Chest tube on the right unchanged in position. No demonstrable pneumothorax. There is mild bibasilar atelectasis. No edema or airspace opacity. Heart size and pulmonary vascular normal. Status post coronary artery bypass grafting. Postoperative change at cervicothoracic junction. IMPRESSION: Stable chest tube positioning on the right without evident pneumothorax. Bibasilar atelectasis. No edema or airspace opacity. Stable cardiac silhouette. Electronically Signed   By: Lowella Grip III M.D.   On: 12/26/2020 07:59   DG CHEST PORT 1 VIEW  Result Date: 12/25/2020 CLINICAL DATA:  History of pneumothorax. EXAM: PORTABLE CHEST 1 VIEW COMPARISON:  12/24/2020. FINDINGS: Right chest tube again noted in good anatomic position. No pneumothorax. Postsurgical changes right lung again noted. No focal infiltrate. No pleural effusion. Prior CABG. Heart size stable. Prior cervical spine fusion. IMPRESSION: Stable chest from prior exam. Right chest tube in  stable position. No pneumothorax. Postsurgical changes again noted of the right lung. Electronically Signed   By: Marcello Moores  Register   On: 12/25/2020 08:18   DG Chest Port 1 View  Result Date: 12/24/2020 CLINICAL DATA:  Chest tube. EXAM: PORTABLE CHEST 1 VIEW COMPARISON:  12/22/2020. FINDINGS: Right chest tube noted with tip in good anatomic position. No pneumothorax. Postsurgical changes right lung. Previously identified mass in the right lung no longer visualized. Prior CABG. Heart size normal. Prior cervical spine fusion. IMPRESSION: 1. Right chest tube noted with tip in good anatomic position. No pneumothorax. Postsurgical changes right lung. Previously identified right pulmonary mass no longer visualized. 2.  Prior CABG.  Heart size normal. Electronically Signed   By: Marcello Moores  Register   On: 12/24/2020 12:22   ECHOCARDIOGRAM COMPLETE  Result Date: 12/27/2020    ECHOCARDIOGRAM REPORT   Patient Name:   DEAKEN JURGENS [facility] Date of Exam: 12/27/2020 Medical Rec #:  [id number]             Height:       69.0 in Accession #:    [id number]            Weight:       195.0 lb Date of Birth:  June 28, 1956              BSA:          2.044 m Patient Age:    1 years              BP:           110/71 mmHg Patient Gender: M                     HR:           78 bpm. Exam Location:  Inpatient Procedure: 2D Echo, Cardiac Doppler and Color Doppler Indications:    Atrial fibrillation  History:        Patient has prior history of Echocardiogram examinations, most                 recent 09/28/2018. Cardiomyopathy, CAD and Previous Myocardial                 Infarction, Prior CABG, Carotid Disease and Stroke,                 Arrythmias:Atrial Fibrillation; Risk Factors:Former Smoker,  Hypertension and Dyslipidemia. Post op Right upper lobectomy,                 lung mass.  Sonographer:    Dustin Flock Referring Phys: [id number] [place]  1. Left ventricular ejection fraction, by estimation, is 55  to 60%. The left ventricle has normal function. The left ventricle has no regional wall motion abnormalities. Left ventricular diastolic parameters are consistent with Grade I diastolic dysfunction (impaired relaxation).  2. Right ventricular systolic function is normal. The right ventricular size is normal.  3. The mitral valve is normal in structure. No evidence of mitral valve regurgitation. No evidence of mitral stenosis.  4. The aortic valve is normal in structure. Aortic valve regurgitation is not visualized. No aortic stenosis is present.  5. The inferior vena cava is normal in size with greater than 50% respiratory variability, suggesting right atrial pressure of 3 mmHg. Comparison(s): No significant change from prior study. FINDINGS  Left Ventricle: Left ventricular ejection fraction, by estimation, is 55 to 60%. The left ventricle has normal function. The left ventricle has no regional wall motion abnormalities. The left ventricular internal cavity size was normal in size. There is  no left ventricular hypertrophy. Left ventricular diastolic parameters are consistent with Grade I diastolic dysfunction (impaired relaxation). Right Ventricle: The right ventricular size is normal. No increase in right ventricular wall thickness. Right ventricular systolic function is normal. Left Atrium: Left atrial size was normal in size. Right Atrium: Right atrial size was normal in size. Pericardium: There is no evidence of pericardial effusion. Mitral Valve: The mitral valve is normal in structure. No evidence of mitral valve regurgitation. No evidence of mitral valve stenosis. Tricuspid Valve: The tricuspid valve is normal in structure. Tricuspid valve regurgitation is not demonstrated. No evidence of tricuspid stenosis. Aortic Valve: The aortic valve is normal in structure. Aortic valve regurgitation is not visualized. No aortic stenosis is present. Pulmonic Valve: The pulmonic valve was normal in structure. Pulmonic  valve regurgitation is trivial. No evidence of pulmonic stenosis. Aorta: The aortic root is normal in size and structure. Venous: The inferior vena cava is normal in size with greater than 50% respiratory variability, suggesting right atrial pressure of 3 mmHg. IAS/Shunts: No atrial level shunt detected by color flow Doppler.  LEFT VENTRICLE PLAX 2D LVIDd:         4.40 cm  Diastology LVIDs:         3.00 cm  LV e' medial:    8.38 cm/s LV PW:         1.10 cm  LV E/e' medial:  8.0 LV IVS:        1.10 cm  LV e' lateral:   8.81 cm/s LVOT diam:     2.30 cm  LV E/e' lateral: 7.6 LV SV:         49 LV SV Index:   24 LVOT Area:     4.15 cm  RIGHT VENTRICLE RV Basal diam:  2.70 cm RV S prime:     4.57 cm/s TAPSE (M-mode): 2.2 cm LEFT ATRIUM             Index       RIGHT ATRIUM           Index LA diam:        3.10 cm 1.52 cm/m  RA Area:     12.50 cm LA Vol (A2C):   24.7 ml 12.08 ml/m RA Volume:   31.00 ml  15.17 ml/m LA Vol (A4C):   21.5 ml 10.52 ml/m LA Biplane Vol: 23.1 ml 11.30 ml/m  AORTIC VALVE LVOT Vmax:   76.40 cm/s LVOT Vmean:  49.800 cm/s LVOT VTI:    0.119 m  AORTA Ao Root diam: 2.90 cm MITRAL VALVE MV Area (PHT): 4.06 cm    SHUNTS MV Decel Time: 187 msec    Systemic VTI:  0.12 m MV E velocity: 67.30 cm/s  Systemic Diam: 2.30 cm MV A velocity: 77.10 cm/s MV E/A ratio:  0.87 Candee Furbish MD Electronically signed by Candee Furbish MD Signature Date/Time: 12/27/2020/2:47:05 PM    Final    CT Super D Chest Wo Contrast  Result Date: 12/14/2020 CLINICAL DATA:  Preop for solitary lung nodule. EXAM: CT CHEST WITHOUT CONTRAST TECHNIQUE: Multidetector CT imaging of the chest was performed using thin slice collimation for electromagnetic bronchoscopy planning purposes, without intravenous contrast. COMPARISON:  CTs of the chest of 10/24/2020 and 11/07/2019. FINDINGS: Cardiovascular: Aortic atherosclerosis. Normal heart size, without pericardial effusion. Median sternotomy for prior CABG. Mediastinum/Nodes: No mediastinal  or definite hilar adenopathy, given limitations of unenhanced CT. Lungs/Pleura: No pleural fluid. Mild centrilobular emphysema. Right upper lobe pulmonary nodule measures 2.1 x 1.7 cm on 63/7. Felt to of enlarged compared to 1.7 x 1.6 cm on the prior exam. 1.6 cm craniocaudal today versus 1.5 cm on the prior (when remeasured). Upper Abdomen: 3 mm hepatic dome low-density lesion is likely a cyst. normal imaged portions of the stomach, spleen, pancreas, adrenal glands, right kidney. 1.6 cm upper pole left renal low-density lesion is likely a cyst. Abdominal aortic atherosclerosis. Musculoskeletal: Cervicothoracic junction fixation. Mid and lower thoracic spondylosis. IMPRESSION: 1. Right upper lobe lung nodule, felt to be minimally enlarged from 10/24/2020. This remains suspicious for primary bronchogenic carcinoma. 2. No thoracic adenopathy. 3. Aortic atherosclerosis (ICD10-I70.0) and emphysema (ICD10-J43.9). Electronically Signed   By: Abigail Miyamoto M.D.   On: 12/14/2020 17:19   DG C-ARM BRONCHOSCOPY  Result Date: 12/24/2020 C-ARM BRONCHOSCOPY: Fluoroscopy was utilized by the requesting physician.  No radiographic interpretation.    Discharge Medications: Allergies as of 01/07/2021      Reactions   Prednisone Other (See Comments)   States that this med makes him "crazy"   Tetanus Toxoids Swelling, Other (See Comments)   Fever, Swelling of the arm    Wellbutrin [bupropion] Other (See Comments)   Crazy thoughts, nightmares   Morphine And Related Hives, Itching   Redness at the injection site   Scallops [shellfish Allergy] Nausea Only   Chantix [varenicline] Other (See Comments)   Dreams      Medication List    STOP taking these medications   acetaminophen 500 MG tablet Commonly known as: TYLENOL   lisinopril 5 MG tablet Commonly known as: ZESTRIL     TAKE these medications   amiodarone 200 MG tablet Commonly known as: PACERONE Take 1 tablet (200 mg total) by mouth daily.    amLODipine 10 MG tablet Commonly known as: NORVASC TAKE 1 TABLET(10 MG) BY MOUTH DAILY What changed: See the new instructions.   aspirin 81 MG tablet Take 1 tablet (81 mg total) by mouth daily.   atorvastatin 80 MG tablet Commonly known as: LIPITOR TAKE 1 TABLET(80 MG) BY MOUTH DAILY What changed:   how much to take  how to take this  when to take this  additional instructions   chlordiazePOXIDE 10 MG capsule Commonly known as: LIBRIUM Take 10-20 mg by mouth 3 (three) times daily as needed  for anxiety.   cloNIDine 0.1 MG tablet Commonly known as: CATAPRES Take 0.1 mg by mouth at bedtime.   clopidogrel 75 MG tablet Commonly known as: PLAVIX TAKE 1 TABLET BY MOUTH EVERY DAY   fluticasone 50 MCG/ACT nasal spray Commonly known as: FLONASE Place 1 spray into both nostrils daily as needed for allergies.   metoprolol succinate 25 MG 24 hr tablet Commonly known as: TOPROL-XL Take 1 tablet (25 mg total) by mouth daily. What changed: additional instructions   nitroGLYCERIN 0.4 MG SL tablet Commonly known as: NITROSTAT PLACE 1 TABLET UNDER THE TONGUE EVERY 5 MINUTES AS NEEDED FOR CHEST PAIN. 3 DOSES MAX What changed:   how much to take  how to take this  when to take this  reasons to take this  additional instructions   ONE-A-DAY MENS 50+ ADVANTAGE PO Take 1 tablet by mouth daily.   oxyCODONE-acetaminophen 5-325 MG tablet Commonly known as: PERCOCET/ROXICET Take 1 tablet by mouth every 6 (six) hours as needed for moderate pain.   ranolazine 1000 MG SR tablet Commonly known as: RANEXA Take 1 tablet (1,000 mg total) by mouth 2 (two) times daily.       Follow Up Appointments:  Follow-up Information    [name], MD. Go on 01/09/2021.   Specialty: Cardiothoracic Surgery Why: Appointment time is at 9:50 am Contact information: [address] [place]  [phone]        Sherran Needs, NP Follow up.    Specialties: Nurse Practitioner, Cardiology Why: Follow-up in the [facility] scheduled for 01/07/2021 at 9:30am with Roderic Palau, NP.  Contact information: [place] [phone]        [name], MD Follow up.   Specialties: Cardiology, Radiology, Interventional Cardiology Why: Please call and schedule an appointment Contact information: [address] [place] [address] [phone]               Signed: Terance Hart [facility] 01/07/2021, 8:58 AM

## 2020-12-25 NOTE — Op Note (Addendum)
Video Bronchoscopy with Electromagnetic Navigation Procedure Note Video bronchoscopy with fiducial dye marking procedure note  Date of Operation: 12/25/2020  Pre-op Diagnosis: Lung mass  Post-op Diagnosis: Lung mass  Surgeon: Garner Nash, DO  Assistants: None   Anesthesia: General endotracheal anesthesia  Operation: Flexible video fiberoptic bronchoscopy with electromagnetic navigation and biopsies.  Estimated Blood Loss: Minimal  Complications: none   Indications and History: [name] is a 65 y.o. male with right upper lobe pulmonary nodule.  The risks, benefits, complications, treatment options and expected outcomes were discussed with the patient.  The possibilities of pneumothorax, pneumonia, reaction to medication, pulmonary aspiration, perforation of a viscus, bleeding, failure to diagnose a condition and creating a complication requiring transfusion or operation were discussed with the patient who freely signed the consent.    Description of Procedure: The patient was seen in the Preoperative Area, was examined and was deemed appropriate to proceed.  The patient was taken to [facility] endoscopy room 2, identified as [name] and the procedure verified as Flexible Video Fiberoptic Bronchoscopy.  A Time Out was held and the above information confirmed.   Prior to the date of the procedure a high-resolution CT scan of the chest was performed. Utilizing [place] a virtual tracheobronchial tree was generated to allow the creation of distinct navigation pathways to the patient's parenchymal abnormalities. After being taken to the operating room general anesthesia was initiated and the patient  was orally intubated. The video fiberoptic bronchoscope was introduced via the endotracheal tube and a general inspection was performed which showed normal right and left lung anatomy with no evidence of endobronchial lesion. The extendable working channel and  locator guide were introduced into the bronchoscope. The distinct navigation pathways prepared prior to this procedure were then utilized to navigate to within 0.8 cm of patient's lesion(s) identified on CT scan. The extendable working channel was secured into place and the locator guide was withdrawn. Under fluoroscopic guidance transbronchial needle brushings, transbronchial Wang needle biopsies, and transbronchial forceps biopsies were performed to be sent for cytology and pathology.  At the end of tissue sampling the lesion was injected using a transbronchial needle with 2 cc of a 50% / 50% mixture of methylene blue and ICG completed for fiducial dye marking. At the end of the procedure a general airway inspection was performed and there was no evidence of active bleeding. The bronchoscope was removed.  The patient tolerated the procedure well. There was no significant blood loss and there were no obvious complications.  Samples: 1. Transbronchial needle brushings from right upper lobe 2. Transbronchial Wang needle biopsies from right upper lobe 3. Transbronchial forceps biopsies from right upper lobe  Plans:  The patient was transferred from endoscopy room 2 to [facility], OR room 10 under the care of Dr. Kipp Brood to proceed with robotic assisted lobectomy.  Garner Nash, DO [facility] 12/25/2020 1:26 PM

## 2020-12-25 NOTE — Plan of Care (Signed)
  Problem: Education: Goal: Knowledge of General Education information will improve Description: Including pain rating scale, medication(s)/side effects and non-pharmacologic comfort measures Outcome: Progressing   Problem: Health Behavior/Discharge Planning: Goal: Ability to manage health-related needs will improve Outcome: Progressing   Problem: Clinical Measurements: Goal: Respiratory complications will improve Outcome: Progressing   Problem: Activity: Goal: Risk for activity intolerance will decrease Outcome: Progressing   Problem: Nutrition: Goal: Adequate nutrition will be maintained Outcome: Progressing   Problem: Pain Managment: Goal: General experience of comfort will improve Outcome: Progressing   

## 2020-12-26 ENCOUNTER — Encounter (HOSPITAL_COMMUNITY): Payer: Self-pay | Admitting: Pulmonary Disease

## 2020-12-26 ENCOUNTER — Inpatient Hospital Stay (HOSPITAL_COMMUNITY): Payer: Medicare Other

## 2020-12-26 DIAGNOSIS — I4891 Unspecified atrial fibrillation: Secondary | ICD-10-CM

## 2020-12-26 LAB — COMPREHENSIVE METABOLIC PANEL
ALT: 15 U/L (ref 0–44)
AST: 20 U/L (ref 15–41)
Albumin: 3.2 g/dL — ABNORMAL LOW (ref 3.5–5.0)
Alkaline Phosphatase: 48 U/L (ref 38–126)
Anion gap: 6 (ref 5–15)
BUN: 23 mg/dL (ref 8–23)
CO2: 26 mmol/L (ref 22–32)
Calcium: 8.8 mg/dL — ABNORMAL LOW (ref 8.9–10.3)
Chloride: 105 mmol/L (ref 98–111)
Creatinine, Ser: 0.99 mg/dL (ref 0.61–1.24)
GFR, Estimated: >60 mL/min (ref >60)
Glucose, Bld: 109 mg/dL — ABNORMAL HIGH (ref 70–99)
Potassium: 4.1 mmol/L (ref 3.5–5.1)
Sodium: 137 mmol/L (ref 135–145)
Total Bilirubin: 0.9 mg/dL (ref 0.3–1.2)
Total Protein: 5.8 g/dL — ABNORMAL LOW (ref 6.5–8.1)

## 2020-12-26 LAB — CBC
HCT: 36.3 % — ABNORMAL LOW (ref 39.0–52.0)
Hemoglobin: 12.1 g/dL — ABNORMAL LOW (ref 13.0–17.0)
MCH: 30.7 pg (ref 26.0–34.0)
MCHC: 33.3 g/dL (ref 30.0–36.0)
MCV: 92.1 fL (ref 80.0–100.0)
Platelets: 174 10*3/uL (ref 150–400)
RBC: 3.94 MIL/uL — ABNORMAL LOW (ref 4.22–5.81)
RDW: 13.2 % (ref 11.5–15.5)
WBC: 13.5 10*3/uL — ABNORMAL HIGH (ref 4.0–10.5)
nRBC: 0 % (ref 0.0–0.2)

## 2020-12-26 LAB — TSH: TSH: 0.847 u[IU]/mL (ref 0.350–4.500)

## 2020-12-26 LAB — MAGNESIUM: Magnesium: 2 mg/dL (ref 1.7–2.4)

## 2020-12-26 MED ORDER — AMIODARONE LOAD VIA INFUSION
150.0000 mg | Freq: Once | INTRAVENOUS | Status: AC
  Administered 2020-12-26: 150 mg via INTRAVENOUS
  Filled 2020-12-26: qty 83.34

## 2020-12-26 MED ORDER — AMIODARONE HCL IN DEXTROSE 360-4.14 MG/200ML-% IV SOLN
30.0000 mg/h | INTRAVENOUS | Status: DC
  Administered 2020-12-27 – 2020-12-28 (×3): 30 mg/h via INTRAVENOUS
  Filled 2020-12-26 (×3): qty 200

## 2020-12-26 MED ORDER — AMIODARONE HCL 200 MG PO TABS: 400.0000 mg | ORAL_TABLET | Freq: Two times a day (BID) | ORAL | Status: DC

## 2020-12-26 MED ORDER — AMIODARONE IV BOLUS ONLY 150 MG/100ML
150.0000 mg | Freq: Once | INTRAVENOUS | Status: AC
  Administered 2020-12-26: 150 mg via INTRAVENOUS
  Filled 2020-12-26: qty 100

## 2020-12-26 MED ORDER — AMIODARONE HCL IN DEXTROSE 360-4.14 MG/200ML-% IV SOLN
60.0000 mg/h | INTRAVENOUS | Status: AC
  Administered 2020-12-26 (×2): 60 mg/h via INTRAVENOUS
  Filled 2020-12-26 (×2): qty 200

## 2020-12-26 NOTE — Progress Notes (Addendum)
      [address]       [place] [address]             [phone]     Patient developed episode of chest discomfort with increased findings of subcutaneous air in the right pectoral region.  Place chest tube to 10 cm of suction and he received some symptomatic relief.  Repeat chest x-ray shows no pneumothorax but some evidence of increased subcu air.  We will continue at 10 cm of suction for now.  He continues to have an air leak.  Additionally he has gone into atrial fibrillation with rapid ventricular response.  He does have a history of previous CABG but no specific history of atrial fibrillation.  He does note that he has occasional palpitations and they have increased at times since he has had COVID.  We will start him on an IV bolus of amiodarone 150 mg with 400 mg p.o. twice daily dosing.  He only has peripheral IVs.  As he has a significant cardiac history we will also ask cardiology to assist with management.  Clinically he appears quite stable  John Giovanni, PA-C   Addendum: spoke with Dr Kipp Brood , we can d/c suction and place back to H2O seal  John Giovanni, PA-C

## 2020-12-26 NOTE — Progress Notes (Addendum)
      [address]       [place] [address]             [phone]      2 Days Post-Op Procedure(s) (LRB): XI ROBOTIC ASSISTED THORASCOPY-RIGHT UPPER LOBECTOMY (Right) INTERCOSTAL NERVE BLOCK (Right) NODE DISSECTION (Right) Subjective: Feels okay this morning. He is a little worried about taking the pain medication.   Objective: Vital signs in last 24 hours: Temp:  [97.7 F (36.5 C)-98.9 F (37.2 C)] 98.2 F (36.8 C) (04/29 0451) Pulse Rate:  [67-81] 80 (04/29 0451) Cardiac Rhythm: Normal sinus rhythm (04/28 1900) Resp:  [15-20] 16 (04/29 0451) BP: (108-133)/(60-84) 133/84 (04/29 0451) SpO2:  [94 %-98 %] 98 % (04/29 0451)     Intake/Output from previous day: 04/28 0701 - 04/29 0700 In: 720 [P.O.:720] Out: 835 [Urine:760; Chest Tube:75] Intake/Output this shift: No intake/output data recorded.  General appearance: alert, cooperative and no distress Heart: regular rate and rhythm, S1, S2 normal, no murmur, click, rub or gallop Lungs: rhonchi, + chest tube rub Abdomen: soft, non-tender; bowel sounds normal; no masses,  no organomegaly Extremities: extremities normal, atraumatic, no cyanosis or edema Wound: clean and dry   Lab Results: Recent Labs    12/25/20 0041 12/26/20 0015  WBC 17.2* 13.5*  HGB 12.4* 12.1*  HCT 38.0* 36.3*  PLT 190 174   BMET:  Recent Labs    12/25/20 0041 12/26/20 0015  NA 134* 137  K 4.2 4.1  CL 103 105  CO2 23 26  GLUCOSE 148* 109*  BUN 22 23  CREATININE 1.35* 0.99  CALCIUM 8.7* 8.8*    PT/INR: No results for input(s): LABPROT, INR in the last 72 hours. ABG    Component Value Date/Time   PHART 7.403 12/22/2020 1408   HCO3 21.3 12/22/2020 1408   TCO2 29 06/23/2018 1038   ACIDBASEDEF 2.7 (H) 12/22/2020 1408   O2SAT 97.0 12/22/2020 1408   CBG (last 3)  No results for input(s): GLUCAP in the last 72 hours.  Assessment/Plan: S/P Procedure(s) (LRB): XI ROBOTIC ASSISTED THORASCOPY-RIGHT UPPER LOBECTOMY  (Right) INTERCOSTAL NERVE BLOCK (Right) NODE DISSECTION (Right)  1. CV - SR with HR in the 80's. On Amlodipine 10 mg daily, Catapres 0.1 mg at hs, Plavix 75 mg daily, and Toprol XL 25 mg daily. 2.  Pulmonary - On room air with good oxygen saturation. Chest tube with 75 cc of output since surgery. Chest tube is to water seal and there is a  +++ air leak with cough.  CXR this am shows no pneumothorax. Chest tube to remain for now. Encourage incentive spirometer 3. Anemia-H and H this am 12.1/36.3 4. Creatinine 0.99. On Toradol and Lisinopril. Stop Lisinopril. Recheck in am 5. Regarding pain control, On scheduled Toradol, Dilaudid PRN, and Ultram. Will not change to Oxy as had previous aggressive behavior with narcotics  Plan: Chest tube output has dropped, however still with a 4+ air leak. He is requesting IV pain medication when the chest tube is eventually pulled. That will not be today since he still has a leak.    LOS: 2 days    Elgie Collard 12/26/2020   Agree with above Persistent air leak Needs more ambulation Continue pulm toilet  Bernisha Verma O [place] Wiseman

## 2020-12-26 NOTE — Consult Note (Addendum)
Cardiology Consultation:   Patient ID: [name] MRN: [id number]; DOB: 1956-05-27  Admit date: 12/24/2020 Date of Consult: 12/26/2020  PCP:  Anthony Melter, MD   [place]  Cardiologist:  Anthony Grooms, MD  Electrophysiologist:  None   Patient Profile:   [name] is a 65 y.o. male with a history of CAD s/p anterior STEMI and emergent CABG in 2017 with multiple subsequent cardiac catheterizations and PCI to RCA in 09/2017, bilateral carotid artery disease, stroke in 10/2016, ischemic cardiomyopathy with EF of 55-60%, hypertension, hyperlipidemia, former tobacco abuse, hepatitis C, anxiety, and depression who is being evaluated for atrial fibrillation at the request of [name].  History of Present Illness:   [name] is a 65 year old male with the above history who is followed by [name]. Patient had an anterior STEMI and emergent CABG in 2017. Since then, he has had multiple episodes of chest pain and multiple cardiac catheterizations in 2018 not resulting in PCI. He has admitted to having some anxiety as well. In 09/2017, he had a cardiac catheterization with PCI to RCA due to narrowing of the graft to the distal RCA. Patient presented to the ED in 05/2018 with palpitations and heart rate up to 120 but normal when he arrived. Also had atypical chest pain. Anxiety was felt to be a component and he requested narcotics for relief. Event monitor 06/03/2018 normal sinus rhythm with occasional sinus bradycardia and sinus tachycardia. No atrial fibrillation or pathologic arrhythmias. Patient presented back to the ED in 08/2018 for atypical chest pain that was reproducible. Echo showed LVEF of 55-60% with no valvular problems. Chest CTA showed no evidence of aortic dissection and mild prominence of the ascending aorta at 3.9 x 3.9 cm. He was admitted again in 03/2019 with chest pain. He underwent cardiac catheterization which showed stable  and continued medical therapy was recommended. Chest pain was felt to be GI in nature. He was admitted in 10/2019 with chest pain. High-sensitivity troponin remained negative during that admission. Chest pain was again felt to be GI related. No ischemic evaluation was performed but his Ranexa was increased to 1,000mg  twice daily.   He has had multiple ED for chest pain since then. Often felt to be GI related. During ED visit for chest pain in 09/2020, he was found to have a 1.7cm mass concerning for primary bronchogenic carcinoma. He was felt to be stable for discharge with close follow-up in PCP's office for further evaluation of mass. He was last seen by [name] in 10/2020 at which time he was stable from a cardiac standpoint. He was ultimately referred to Pulmonology and CT surgery for evaluation of lung mass.   He was seen by [name] on 11/28/2020 and decision was made decision was made to proceed with combination procedure with [name] in which she would perform a navigational bronchoscopy with plans for robotic assisted right thoracoscopy and right upper lobectomy if it shows non-small cell lung cancer. Patient presented for this procedure on 12/24/2020. Biopsy from the navigational bronchoscopy showed poorly differentiated carcinoma that they were unable to differentiate between small cell and non-small cell . However, decision was made to proceed with right upper lobectomy. Today, patient developed episode of chest discomfort with increased findings of subcutaneous air in the right pectoral region. Chest x-ray negative for pneumothorax. He was also noted to be in atrial fibrillation with RVR. He was given a bolus of IV Amiodarone and then started  on PO Amiodarone. Therefore, Cardiology was consulted for further evaluation.   Patient reports onset of severe sharp chest pain earlier this morning. He states he has never felt anything that sharp. Resolved after chest tube settings were adjusted. He  also has recurrent chest pain that has been felt to be GI related in the past. Recent PET scan suggestive of esophagitis. However, this is stable. Patient denies any real palpitations with the atrial fibrillation. He has never been formally diagnosed with atrial fibrillation but does not he has had multiple episodes of irregular fast heart rhythms noted on home devices. He has worn multiple outpatient monitors in the past (last in 2019) with no evidence of atrial fibrillation. He had some mild lightheadedness when ambulating earlier today but no other reports of lightheadedness, dizziness, or near syncope. No shortness of breath, orthopnea, PND, or edema. No recent fevers or illnesses. He does states he was diagnosed with COVID in January of this year and has had some long haul symptoms since then. No cough until after bronchoscopy. Occasional nasal congestion. No abnormal bleeding on DAPT including hemoptysis, hematochezia, melena, and hematuria.  At the time of this evaluation, patient still in atrial fibrillation with rates in the 80's at but up to 110's with minimal activity such as moving around in bed.    Past Medical History:  Diagnosis Date  . Anxiety   . Arthritis   . Basal cell carcinoma (BCC) of forehead   . CAD (coronary artery disease)    a. 10/2015 ant STEMI >> LHC with 3 v CAD; oLAD tx with POBA >> emergent CABG. b. Multiple evals since that time, early graft failure of SVG-RCA by cath 03/2016. c. 2/19 PCI/DES x1 to pRCA, normal EF.  . Carotid artery disease ([place])    a. 40-59% BICA 02/2018.  Marland Kitchen Depression   . Dyspnea   . Ectopic atrial tachycardia ([place])   . Esophageal reflux    eosinophil esophagitis  . Family history of adverse reaction to anesthesia    "sister has PONV" (06/21/2017)  . Former tobacco use   . Gout   . Hepatitis C    "treated and cured" (06/21/2017)  . High cholesterol   . History of kidney stones   . Hypertension   . Ischemic cardiomyopathy    a. EF 25-30%  at intraop TEE 4/17  //  b. Limited Echo 5/17 - EF 45-50%, mild ant HK. c. EF 55-65% by cath 09/2017.  . Migraine    "3-4/yr" (06/21/2017)  . Myocardial infarction ([place]) 10/2015  . Palpitations   . Sinus bradycardia    a. HR dropping into 40s in 02/2016 -> BB reduced.  . Stroke ([place]) 10/2016   "small one; sometimes my memory/cognitive issues" (06/21/2017)  . Symptomatic hypotension    a. 02/2016 ER visit -> meds reduced.  . Syncope     Past Surgical History:  Procedure Laterality Date  . ANTERIOR CERVICAL DECOMP/DISCECTOMY FUSION N/A 10/17/2018   Procedure: Anterior Cervical Decompression Fusion - Cervical seven -Thoracic one;  Surgeon: Consuella Lose, MD;  Location: [facility];  Service: Neurosurgery;  Laterality: N/A;  . BASAL CELL CARCINOMA EXCISION     "forehead  . BIOPSY  07/20/2019   Procedure: BIOPSY;  Surgeon: Carol Ada, MD;  Location: [facility];  Service: Endoscopy;;  . BRONCHIAL BIOPSY  12/24/2020   Procedure: BRONCHIAL BIOPSIES;  Surgeon: Garner Nash, DO;  Location: [facility];  Service: Pulmonary;;  . BRONCHIAL BRUSHINGS  12/24/2020   Procedure: BRONCHIAL  BRUSHINGS;  Surgeon: Garner Nash, DO;  Location: [facility];  Service: Pulmonary;;  . BRONCHIAL NEEDLE ASPIRATION BIOPSY  12/24/2020   Procedure: BRONCHIAL NEEDLE ASPIRATION BIOPSIES;  Surgeon: Garner Nash, DO;  Location: [facility];  Service: Pulmonary;;  . CARDIAC CATHETERIZATION N/A 11/28/2015   Procedure: Left Heart Cath and Coronary Angiography;  Surgeon: [name], MD;  Location: [facility];  Service: Cardiovascular;  Laterality: N/A;  . CARDIAC CATHETERIZATION N/A 11/28/2015   Procedure: Coronary Balloon Angioplasty;  Surgeon: [name], MD;  Location: [facility];  Service: Cardiovascular;  Laterality: N/A;  ostial LAD  . CARDIAC CATHETERIZATION N/A 11/28/2015   Procedure: Coronary/Graft Angiography;  Surgeon: [name], MD;  Location: [facility];  Service: Cardiovascular;  Laterality: N/A;  coronaries only   . CARDIAC CATHETERIZATION N/A 04/21/2016   Procedure: Left Heart Cath and Coronary Angiography;  Surgeon: Wellington Hampshire, MD;  Location: [place];  Service: Cardiovascular;  Laterality: N/A;  . CARDIAC CATHETERIZATION N/A 06/14/2016   Procedure: Left Heart Cath and Cors/Grafts Angiography;  Surgeon: Lorretta Harp, MD;  Location: [place];  Service: Cardiovascular;  Laterality: N/A;  . CARDIAC CATHETERIZATION N/A 09/08/2016   Procedure: Left Heart Cath and Cors/Grafts Angiography;  Surgeon: Wellington Hampshire, MD;  Location: [place] [facility];  Service: Cardiovascular;  Laterality: N/A;  . CARDIAC CATHETERIZATION    . CORONARY ARTERY BYPASS GRAFT N/A 11/28/2015   Procedure: CORONARY ARTERY BYPASS GRAFTING (CABG) TIMES FIVE USING LEFT INTERNAL MAMMARY ARTERY AND RIGHT GREATER SAPHENOUS,VIEN HARVEATED BY ENDOVIEN, INTRAOPPRATIVE TEE;  Surgeon: Gaye Pollack, MD;  Location: [facility];  Service: Open Heart Surgery;  Laterality: N/A;  . CORONARY STENT INTERVENTION N/A 10/05/2017   Procedure: CORONARY STENT INTERVENTION;  Surgeon: [name], MD;  Location: [facility] [facility];  Service: Cardiovascular;  Laterality: N/A;  . ESOPHAGOGASTRODUODENOSCOPY (EGD) WITH PROPOFOL N/A 07/20/2019   Procedure: ESOPHAGOGASTRODUODENOSCOPY (EGD) WITH PROPOFOL;  Surgeon: Carol Ada, MD;  [facility];  Service: Endoscopy;  Laterality: N/A;  . FIDUCIAL MARKER PLACEMENT  12/24/2020   Procedure: FIDUCIAL DYE MARKER PLACEMENT;  Surgeon: Garner Nash, DO;  Location: [facility];  Service: Pulmonary;;  . HUMERUS SURGERY Right 1969   "tumor inside bone; filled it w/bone chips"  . INTERCOSTAL NERVE BLOCK Right 12/24/2020   Procedure: INTERCOSTAL NERVE BLOCK;  Surgeon: [name], MD;  Location: [facility];  Service: Thoracic;  Laterality: Right;  . LEFT HEART CATH AND CORS/GRAFTS ANGIOGRAPHY N/A 03/11/2017    Procedure: Left Heart Cath and Cors/Grafts Angiography;  Surgeon: Leonie Man, MD;  Location: [facility];  Service: Cardiovascular;  Laterality: N/A;  . LEFT HEART CATH AND CORS/GRAFTS ANGIOGRAPHY N/A 10/05/2017   Procedure: LEFT HEART CATH AND CORS/GRAFTS ANGIOGRAPHY;  Surgeon: [name], MD;  Location: [facility];  Service: Cardiovascular;  Laterality: N/A;  . LEFT HEART CATH AND CORS/GRAFTS ANGIOGRAPHY N/A 04/11/2019   Procedure: LEFT HEART CATH AND CORS/GRAFTS ANGIOGRAPHY;  Surgeon: [name], MD;  Location: [facility];  Service: Cardiovascular;  Laterality: N/A;  . NODE DISSECTION Right 12/24/2020   Procedure: NODE DISSECTION;  Surgeon: [name], MD;  Location: [facility];  Service: Thoracic;  Laterality: Right;  . PERIPHERAL VASCULAR CATHETERIZATION N/A 06/14/2016   Procedure: Lower Extremity Angiography;  Surgeon: Lorretta Harp, MD;  Location: [facility];  Service: Cardiovascular;  Laterality: N/A;  .  VIDEO BRONCHOSCOPY WITH ENDOBRONCHIAL NAVIGATION Right 12/24/2020   Procedure: VIDEO BRONCHOSCOPY WITH ENDOBRONCHIAL NAVIGATION;  Surgeon: Garner Nash, DO;  Location: [facility];  Service: Pulmonary;  Laterality: Right;  Marland Kitchen VIDEO BRONCHOSCOPY WITH ENDOBRONCHIAL ULTRASOUND N/A 12/24/2020   Procedure: VIDEO BRONCHOSCOPY WITH ENDOBRONCHIAL ULTRASOUND;  Surgeon: Garner Nash, DO;  Location: [facility];  Service: Pulmonary;  Laterality: N/A;     Home Medications:  Prior to Admission medications   Medication Sig Start Date End Date Taking? Authorizing Provider  acetaminophen (TYLENOL) 500 MG tablet Take 1,000 mg by mouth every 6 (six) hours as needed for mild pain or headache.   Yes [provider]  amLODipine (NORVASC) 10 MG tablet TAKE 1 TABLET(10 MG) BY MOUTH DAILY Patient taking differently: Take 10 mg by mouth daily. 12/02/20  Yes [name], MD  aspirin 81 MG tablet Take 1 tablet (81 mg total) by  mouth daily. 10/24/18  Yes Costella, Vista Mink, PA-C  atorvastatin (LIPITOR) 80 MG tablet TAKE 1 TABLET(80 MG) BY MOUTH DAILY Patient taking differently: Take 80 mg by mouth daily. 12/10/19  Yes [name], MD  chlordiazePOXIDE (LIBRIUM) 10 MG capsule Take 10-20 mg by mouth 3 (three) times daily as needed for anxiety.    Yes [provider]  cloNIDine (CATAPRES) 0.1 MG tablet Take 0.1 mg by mouth at bedtime. 04/09/20  Yes [provider]  clopidogrel (PLAVIX) 75 MG tablet TAKE 1 TABLET BY MOUTH EVERY DAY Patient taking differently: Take 75 mg by mouth daily. 02/06/20  Yes [name], MD  fluticasone [facility]) 50 MCG/ACT nasal spray Place 1 spray into both nostrils daily as needed for allergies.   Yes [provider]  lisinopril (ZESTRIL) 5 MG tablet TAKE 1 TABLET BY MOUTH DAILY. MAY TAKE ADDITIONAL TABLET ONCE DAILY FOR SYSTOLIC BLOOD PRESSURE GREATER THAN 150 ONE HOUR AFTER TAKING DAILY DOSE Patient taking differently: Take 5 mg by mouth daily. MAY TAKE ADDITIONAL TABLET ONCE DAILY FOR SYSTOLIC BLOOD PRESSURE GREATER THAN 150 ONE HOUR AFTER TAKING DAILY DOSE 05/13/20  Yes [name], MD  metoprolol succinate (TOPROL-XL) 25 MG 24 hr tablet Take 1 tablet (25 mg total) by mouth daily. Patient taking differently: Take 25 mg by mouth daily. Additional if needed for increase pulse rate 08/27/20  Yes [name], MD  Multiple Vitamins-Minerals (ONE-A-DAY MENS 50+ ADVANTAGE PO) Take 1 tablet by mouth daily.    Yes [provider]  ranolazine (RANEXA) 1000 MG SR tablet Take 1 tablet (1,000 mg total) by mouth 2 (two) times daily. 11/08/19  Yes Reino Bellis B, NP  nitroGLYCERIN (NITROSTAT) 0.4 MG SL tablet PLACE 1 TABLET UNDER THE TONGUE EVERY 5 MINUTES AS NEEDED FOR CHEST PAIN. 3 DOSES MAX Patient taking differently: Place 0.4 mg under the tongue every 5 (five) minutes as needed for chest pain. 3 DOSES MAX 04/16/20   [name],  MD    Inpatient Medications: Scheduled Meds: . acetaminophen  1,000 mg Oral Q6H   Or  . acetaminophen (TYLENOL) oral liquid 160 mg/5 mL  1,000 mg Oral Q6H  . amiodarone  400 mg Oral BID  . amLODipine  10 mg Oral Daily  . aspirin EC  81 mg Oral Daily  . atorvastatin  80 mg Oral Daily  . bisacodyl  10 mg Oral Daily  . cloNIDine  0.1 mg Oral QHS  . clopidogrel  75 mg Oral Daily  . ketorolac  30 mg Intravenous Q6H  . metoprolol succinate  25 mg Oral Daily  . senna-docusate  1 tablet Oral QHS   Continuous Infusions: . sodium chloride     PRN Meds: Place/Maintain arterial line **AND** sodium chloride, alum & mag hydroxide-simeth, chlordiazePOXIDE, fluticasone, HYDROmorphone (DILAUDID) injection, ondansetron (ZOFRAN) IV, oxyCODONE-acetaminophen  Allergies:    Allergies  Allergen Reactions  . Prednisone Other (See Comments)    States that this med makes him "crazy"  . Tetanus Toxoids Swelling and Other (See Comments)    Fever, Swelling of the arm   . Wellbutrin [Bupropion] Other (See Comments)    Crazy thoughts, nightmares  . Morphine And Related Hives and Itching    Redness at the injection site  . Scallops [Shellfish Allergy] Nausea Only  . Chantix [Varenicline] Other (See Comments)    Dreams    Social History:   Social History   Socioeconomic History  . Marital status: Married    Spouse name: [name]  . Number of children: 3  . Years of education: College  . Highest education level: Not on file  Occupational History  . Occupation: Scientist, research (physical sciences): SELF-EMPLOYED  Tobacco Use  . Smoking status: Former Smoker    Packs/day: 0.75    Years: 44.00    Pack years: 33.00    Types: Cigarettes    Quit date: 11/28/2015    Years since quitting: 5.0  . Smokeless tobacco: Never Used  Vaping Use  . Vaping Use: Never used  Substance and Sexual Activity  . Alcohol use: Yes    Comment: occ  . Drug use: No    Comment: 06/21/2017 "nothing since the 1980s"  . Sexual  activity: Yes  Other Topics Concern  . Not on file  Social History Narrative   Patient lives at home with his spouse.   Caffeine Use: yes   Social Determinants of Health   Financial Resource Strain: Not on file  Food Insecurity: Not on file  Transportation Needs: Not on file  Physical Activity: Not on file  Stress: Not on file  Social Connections: Not on file  Intimate Partner Violence: Not on file    Family History:    Family History  Problem Relation Age of Onset  . Lung cancer Mother   . Heart Problems Father   . Heart attack Father 23  . Stroke Father   . Heart failure Father   . Heart attack Maternal Grandmother   . Stroke Maternal Grandmother   . Heart attack Paternal Uncle   . Hypertension Brother   . Autoimmune disease Neg Hx      ROS:  Please see the history of present illness.  Review of Systems  Constitutional: Negative for fever and malaise/fatigue.  HENT: Positive for congestion.   Respiratory: Negative for hemoptysis and shortness of breath.   Cardiovascular: Positive for chest pain and orthopnea. Negative for palpitations, leg swelling and PND.  Gastrointestinal: Negative for blood in stool and melena.  Genitourinary: Negative for hematuria.  Musculoskeletal: Negative for myalgias.  Neurological: Negative for loss of consciousness.  Endo/Heme/Allergies: Does not bruise/bleed easily.  Psychiatric/Behavioral: Substance abuse: former tobacco use.    Physical Exam/Data:   Vitals:   12/25/20 1523 12/25/20 2011 12/25/20 2333 12/26/20 0451  BP: 108/67 119/72 108/67 133/84  Pulse: 70 74 67 80  Resp: 19 16 20 16   Temp: 98.9 F (37.2 C) 98 F (36.7 C) 97.7 F (36.5 C) 98.2 F (36.8 C)  TempSrc: Oral Oral Oral Oral  SpO2: 97% 96% 94% 98%  Weight:  Height:        Intake/Output Summary (Last 24 hours) at 12/26/2020 1600 Last data filed at 12/26/2020 1334 Gross per 24 hour  Intake 360 ml  Output 330 ml  Net 30 ml   Last 3 Weights 12/24/2020  12/22/2020 11/28/2020  Weight (lbs) 195 lb 200 lb 1 oz 200 lb  Weight (kg) 88.451 kg 90.748 kg 90.719 kg     Body mass index is 28.8 kg/m.  General: 65 y.o. male resting comfortably in no acute distress.  HEENT: Normocephalic and atraumatic. Sclera clear.  Neck: Supple. No carotid bruits. No JVD. Heart: Irregularly irregular rhythm. Distinct S1 and S2. No significant murmurs, gallops, or rubs. Chest tube in place. Lungs: No increased work of breathing. Some rhonchi present on right. No significant wheezes or rales noted. Abdomen: Soft, non-distended, and non-tender to palpation. Bowel sounds present. Extremities: No lower extremity edema.    Skin: Warm and dry. Neuro: Alert and oriented x3. No focal deficits. Psych: Normal affect. Responds appropriately.  EKG:  The EKG from 12/22/2020 was personally reviewed and demonstrates: Normal sinus rhythm, rate 81 bpm, with minimal ST depression in lateral leads. Normal axis. Normal PR and QRS intervals. QTc 415 ms.  Telemetry:  Telemetry was personally reviewed and demonstrates:  Atrial fibrillation with rates ranging from the 80's to 120's but as high as the 140's to 160's.  Relevant CV Studies:  Event Monitor 06/13/2018:  Sinus rhtyhm predominantly. Occasional sinus bradycardia and sinus tachycardia.  No atrial fibrillation.  No pathologic arrhtyhmias.  No cardiac cuase of syncope identified. _______________  Echocardiogram 09/28/2018: Impressions: 1. The left ventricle has normal systolic function of 48-54%. The cavity  size is normal. There is normal left ventricular hypertrophy. Echo evidence of normal diastolic filling patterns.  2. Normal left atrial size.  3. Normal right atrial size.  4. Normal tricuspid valve.  5. Tricuspid regurgitation is mild.  6. No atrial level shunt detected by color flow Doppler.  _______________  Left Heart Catheterization 04/11/2019:  Mid LAD lesion is 55% stenosed.  Ost LAD to Prox LAD  lesion is 50% stenosed. LIMA to LAD is patent.  Ost Ramus lesion is 90% stenosed.  Ramus lesion is 75% stenosed.  Ost Cx lesion is 80% stenosed.  Prox Cx to Mid Cx lesion is 50% stenosed. SVG to OM is patent.  Dist RCA lesion is 50% stenosed.  RPDA lesion is 40% stenosed. SVG to PDA is patent with proximal disease that is unchanged.  Previously placed Ost RCA to Prox RCA drug eluting stent is widely patent.  Graft to the diagonal was occluded. There was TIMI 3 flow in the small diagonal with proximal ectasia.  The left ventricular systolic function is normal.  LV end diastolic pressure is normal. LVEDP 11 mm Hg.  The left ventricular ejection fraction is 50-55% by visual estimate.  There is no aortic valve stenosis.  Continue medical therapy.   Laboratory Data:  High Sensitivity Troponin:  No results for input(s): TROPONINIHS in the last 720 hours.   Chemistry Recent Labs  Lab 12/22/20 1430 12/25/20 0041 12/26/20 0015  NA 135 134* 137  K 4.1 4.2 4.1  CL 105 103 105  CO2 21* 23 26  GLUCOSE 105* 148* 109*  BUN 21 22 23   CREATININE 1.13 1.35* 0.99  CALCIUM 9.5 8.7* 8.8*  GFRNONAA >60 58* >60  ANIONGAP 9 8 6     Recent Labs  Lab 12/22/20 1430 12/26/20 0015  PROT 8.0 5.8*  ALBUMIN 4.5  3.2*  AST 24 20  ALT 32 15  ALKPHOS 66 48  BILITOT 1.1 0.9   Hematology Recent Labs  Lab 12/22/20 1430 12/25/20 0041 12/26/20 0015  WBC 12.1* 17.2* 13.5*  RBC 4.97 4.13* 3.94*  HGB 14.9 12.4* 12.1*  HCT 45.1 38.0* 36.3*  MCV 90.7 92.0 92.1  MCH 30.0 30.0 30.7  MCHC 33.0 32.6 33.3  RDW 12.8 13.0 13.2  PLT 272 190 174   BNPNo results for input(s): BNP, PROBNP in the last 168 hours.  DDimer No results for input(s): DDIMER in the last 168 hours.   Radiology/Studies:  DG CHEST PORT 1 VIEW  Result Date: 12/26/2020 CLINICAL DATA:  Chest tube in place for pneumothorax EXAM: PORTABLE CHEST 1 VIEW COMPARISON:  December 26, 2020 study obtained earlier in the day. FINDINGS:  Chest tube position unchanged. No evident pneumothorax. There is slight left base atelectasis. Lungs elsewhere are clear. Heart size and pulmonary vascularity are normal. Patient is status post internal mammary bypass grafting. There is aortic atherosclerosis. No adenopathy. There is postoperative change in the lower cervical region. IMPRESSION: Chest tube on the right, unchanged. No pneumothorax. Mild left base atelectasis. Lungs otherwise clear. Stable cardiac silhouette. Aortic Atherosclerosis (ICD10-I70.0). Electronically Signed   By: Lowella Grip III M.D.   On: 12/26/2020 13:22   DG CHEST PORT 1 VIEW  Result Date: 12/26/2020 CLINICAL DATA:  Chest tube in place for recent pneumothorax EXAM: PORTABLE CHEST 1 VIEW COMPARISON:  December 25, 2020 FINDINGS: Chest tube on the right unchanged in position. No demonstrable pneumothorax. There is mild bibasilar atelectasis. No edema or airspace opacity. Heart size and pulmonary vascular normal. Status post coronary artery bypass grafting. Postoperative change at cervicothoracic junction. IMPRESSION: Stable chest tube positioning on the right without evident pneumothorax. Bibasilar atelectasis. No edema or airspace opacity. Stable cardiac silhouette. Electronically Signed   By: Lowella Grip III M.D.   On: 12/26/2020 07:59   DG CHEST PORT 1 VIEW  Result Date: 12/25/2020 CLINICAL DATA:  History of pneumothorax. EXAM: PORTABLE CHEST 1 VIEW COMPARISON:  12/24/2020. FINDINGS: Right chest tube again noted in good anatomic position. No pneumothorax. Postsurgical changes right lung again noted. No focal infiltrate. No pleural effusion. Prior CABG. Heart size stable. Prior cervical spine fusion. IMPRESSION: Stable chest from prior exam. Right chest tube in stable position. No pneumothorax. Postsurgical changes again noted of the right lung. Electronically Signed   By: Marcello Moores  Register   On: 12/25/2020 08:18   DG Chest Port 1 View  Result Date: 12/24/2020 CLINICAL  DATA:  Chest tube. EXAM: PORTABLE CHEST 1 VIEW COMPARISON:  12/22/2020. FINDINGS: Right chest tube noted with tip in good anatomic position. No pneumothorax. Postsurgical changes right lung. Previously identified mass in the right lung no longer visualized. Prior CABG. Heart size normal. Prior cervical spine fusion. IMPRESSION: 1. Right chest tube noted with tip in good anatomic position. No pneumothorax. Postsurgical changes right lung. Previously identified right pulmonary mass no longer visualized. 2.  Prior CABG.  Heart size normal. Electronically Signed   By: Marcello Moores  Register   On: 12/24/2020 12:22   DG C-ARM BRONCHOSCOPY  Result Date: 12/24/2020 C-ARM BRONCHOSCOPY: Fluoroscopy was utilized by the requesting physician.  No radiographic interpretation.     Assessment and Plan:   New Onset Post-Op Atrial Fibrillation - Occurred following right upper lobectomy for resection of lung mass (biopsy showed poorly differentiated carcinoma that they were unable to differentiate between small cell and non-small cell). - Rates currently in the  80's at rest but increases to 110's to 120s minimal activity such as moving around in bed and reportedly as high as the 160's when ambulating in the halls. - Potassium 4.1 today.  - Will check Magnesium and TSH. - Will update Echo. - He received bolus of IV Amiodarone and then was started on PO Amiodarone 400mg  twice daily. Will stop PO Amiodarone and plan to restart IV Amiodarone. Will rebolus with another 150mg  and then start drip. Will plan to continue this for 24 hours. Hopefully, he will convert back to sinus rhythm with this. - Continue Toprol-XL 25mg  daily. - CHA2DS2-VASc score = 6 (CAD, CHF, HTN, stroke x2, age). However, given occurred in post-op setting, will not start anticoagulation right now. If patient does not convert back to sinus rhythm in the next 24-48 hours, consider starting Eliquis. Discussed with Dr. Burt Knack, if Eliquis is needed would stop  Plavix but continue Aspirin given CAD.  Chronic Chest Pain CAD - S/p anterior STEMI and emergent CABG in 2017 with multiple subsequent cardiac catheterizations and PCI to RCA in 09/2017. He has recurrent chest pain and has had multipel ED visit and hospitalization for this. Typically felt to be GI related. Recent PET scan suggestive of possible esophagitis.  - Patient did have severe chest pain this morning due to  increased findings of subcutaneous air in the right pectoral region. Resolved with adjustment of chest tube.  - EKG from 4/25 shows no acute ST/T changes. Will get EKG while he is in atrial fibrillation.  - Continue dual antiplatelel therapy with Aspirin and Plavix. - Continue Amlodipine 10mg  daily, Ranexa 1,000mg  twice daily, Toprol-XL 25mg  daily, and Lipitor 80mg  daily.  Ischemic Cardiomyopathy - LVEF of 55-60% on Echo in 08/2018. - Will update Echo. - Euvolemic on exam. No need for diuretics at this time.  - Continue Toprol-XL 25mg  daily. - Home Lisinopril was stopped yesterday due to mild increase in creatinine. Creatinine has since normalized. Can restart if needed. - Continue to monitor volume status closely.  Bilateral Carotid Artery Disease - Carotid ultrasounds in 02/2018 showed 40-59% stenosis of bilateral ICAs.  - Continue DAPT and high-intensity statin. - Needs repeat dopplers. But this can be done as outpatient.  Hypertension - BP well controlled. - Continue Amlodipine 10mg  daily and Toprol-XL 25mg  daily. - Home Lisinopril stopped yesterday due to mild bump in creatinine. Back to baseline today. Can restart if needed.  Hyperlipidemia - LDL 89 in 10/2019. - Continue Lipitor 80mg  daily. - Will recheck fasting lipid panel in the morning.    Risk Assessment/Risk Scores:     New York Heart Association (NYHA) Functional Class NYHA Class I  CHA2DS2-VASc Score = 6  This indicates a 9.7% annual risk of stroke. The patient's score is based upon: CHF  History: Yes HTN History: Yes Diabetes History: No Stroke History: Yes Vascular Disease History: Yes Age Score: 1 Gender Score: 0   For questions or updates, please contact [place] Please consult [email or web address] for contact info under    Signed, [name], PA-C  12/26/2020 4:00 PM   Patient seen, examined. Available data reviewed. Agree with findings, assessment, and plan as outlined by Sande Rives, PA-C.  The patient is independently interviewed and examined.  His wife is at the bedside.  The patient is alert, oriented, in no distress.  HEENT is normal, JVP is normal, carotid upstrokes are normal with no bruits, heart is irregularly irregular with no murmur or gallop, lungs are clear bilaterally, abdomen is  soft and nontender, there are no masses, extremities have trace pretibial edema, skin is warm and dry with no rash.  Telemetry shows atrial fibrillation with a heart rate in the 90s at rest, increased to 130-1 140s with activity.  This started at about 12:00 noon today.  He was previously in sinus rhythm with occasional PACs.  The patient's complex cardiovascular history is reviewed.  This includes CAD with history of MI and CABG as well as multiple PCI procedures.  The patient underwent right upper lobectomy 12/24/2020 without initial complication, but he developed atrial fibrillation with RVR this afternoon.  The patient was given a single bolus of IV amiodarone and is written for oral amiodarone.  I think he might benefit from a rebolus and IV amiodarone loading.  We will write orders for this and hopefully he will convert over the next 24 hours.  We will hold anticoagulation as he has a chest tube in place.  He will remain on aspirin and clopidogrel.  If he converts to sinus rhythm, I think he can be treated as postoperative atrial fibrillation and transition to oral amiodarone without oral anticoagulation.  However, I do think he should have an outpatient monitor to make  sure that he is not experiencing atrial fibrillation.  It is reasonable to continue him on oral amiodarone for about 4 weeks after surgery to get him over the elevated perioperative risk of atrial fibrillation.  This is all reviewed with the patient and his wife today.  We will arrange follow-up in the atrial fibrillation clinic with plans to set him up for a monitor as an outpatient.  We will follow with you and hopefully he will convert to sinus rhythm and be able to transition from IV to oral amiodarone in the next 24 hours or so.  [name], M.D. 12/26/2020 4:19 PM

## 2020-12-26 NOTE — Plan of Care (Signed)
  Problem: Education: Goal: Knowledge of General Education information will improve Description: Including pain rating scale, medication(s)/side effects and non-pharmacologic comfort measures Outcome: Progressing   Problem: Health Behavior/Discharge Planning: Goal: Ability to manage health-related needs will improve Outcome: Progressing   Problem: Clinical Measurements: Goal: Ability to maintain clinical measurements within normal limits will improve Outcome: Progressing   Problem: Clinical Measurements: Goal: Diagnostic test results will improve Outcome: Progressing   Problem: Clinical Measurements: Goal: Cardiovascular complication will be avoided Outcome: Progressing   Problem: Activity: Goal: Risk for activity intolerance will decrease Outcome: Progressing   Problem: Nutrition: Goal: Adequate nutrition will be maintained Outcome: Progressing   Problem: Coping: Goal: Level of anxiety will decrease Outcome: Progressing   Problem: Pain Managment: Goal: General experience of comfort will improve Outcome: Progressing   Problem: Skin Integrity: Goal: Risk for impaired skin integrity will decrease Outcome: Progressing

## 2020-12-26 NOTE — Progress Notes (Signed)
New subcutaneous air noted on right side of chest. Jadene Pierini paged. Verbal orders to place CT to -10 suction.

## 2020-12-27 ENCOUNTER — Inpatient Hospital Stay (HOSPITAL_COMMUNITY): Payer: Medicare Other

## 2020-12-27 DIAGNOSIS — I480 Paroxysmal atrial fibrillation: Secondary | ICD-10-CM | POA: Diagnosis not present

## 2020-12-27 DIAGNOSIS — I4891 Unspecified atrial fibrillation: Secondary | ICD-10-CM | POA: Diagnosis not present

## 2020-12-27 DIAGNOSIS — I25118 Atherosclerotic heart disease of native coronary artery with other forms of angina pectoris: Secondary | ICD-10-CM | POA: Diagnosis not present

## 2020-12-27 DIAGNOSIS — Z902 Acquired absence of lung [part of]: Secondary | ICD-10-CM | POA: Diagnosis not present

## 2020-12-27 DIAGNOSIS — R911 Solitary pulmonary nodule: Secondary | ICD-10-CM | POA: Diagnosis not present

## 2020-12-27 LAB — ECHOCARDIOGRAM COMPLETE
Area-P 1/2: 4.06 cm2
Height: 69 in
S' Lateral: 3 cm
Weight: 3120 oz

## 2020-12-27 LAB — LIPID PANEL
Cholesterol: 82 mg/dL (ref 0–200)
HDL: 35 mg/dL — ABNORMAL LOW (ref >40)
LDL Cholesterol: 28 mg/dL (ref 0–99)
Total CHOL/HDL Ratio: 2.3 RATIO
Triglycerides: 97 mg/dL (ref <150)
VLDL: 19 mg/dL (ref 0–40)

## 2020-12-27 MED ORDER — TRAMADOL HCL 50 MG PO TABS
50.0000 mg | ORAL_TABLET | Freq: Four times a day (QID) | ORAL | Status: DC | PRN
  Administered 2020-12-27 – 2021-01-04 (×5): 50 mg via ORAL
  Filled 2020-12-27 (×7): qty 1

## 2020-12-27 MED ORDER — MAGNESIUM HYDROXIDE 400 MG/5ML PO SUSP
30.0000 mL | Freq: Every day | ORAL | Status: DC | PRN
  Administered 2020-12-27 – 2021-01-05 (×3): 30 mL via ORAL
  Filled 2020-12-27: qty 30

## 2020-12-27 MED ORDER — RANOLAZINE ER 500 MG PO TB12
1000.0000 mg | ORAL_TABLET | Freq: Two times a day (BID) | ORAL | Status: DC
  Administered 2020-12-27 – 2021-01-07 (×22): 1000 mg via ORAL
  Filled 2020-12-27 (×22): qty 2

## 2020-12-27 NOTE — Progress Notes (Signed)
Progress Note  Patient Name: [name] Date of Encounter: 12/27/2020  [facility] Cardiologist: Larae Grooms, MD   Subjective   Feeling well.  Converted to normal sinus rhythm at about 2300 hrs. last night.  He was not really aware of palpitations while atrial fibrillation was occurring.  However, he reports that at home he will have normal heart rate (60-70) at rest, with markedly increased heart rates to 125-130 bpm with minimal activity.  This has been occurring ever since he had COVID in January.  Inpatient Medications    Scheduled Meds: . acetaminophen  1,000 mg Oral Q6H   Or  . acetaminophen (TYLENOL) oral liquid 160 mg/5 mL  1,000 mg Oral Q6H  . amLODipine  10 mg Oral Daily  . aspirin EC  81 mg Oral Daily  . atorvastatin  80 mg Oral Daily  . bisacodyl  10 mg Oral Daily  . cloNIDine  0.1 mg Oral QHS  . clopidogrel  75 mg Oral Daily  . ketorolac  30 mg Intravenous Q6H  . metoprolol succinate  25 mg Oral Daily  . senna-docusate  1 tablet Oral QHS   Continuous Infusions: . sodium chloride    . amiodarone Stopped (12/27/20 0820)   PRN Meds: Place/Maintain arterial line **AND** sodium chloride, alum & mag hydroxide-simeth, chlordiazePOXIDE, fluticasone, HYDROmorphone (DILAUDID) injection, magnesium hydroxide, ondansetron (ZOFRAN) IV, oxyCODONE-acetaminophen, traMADol   Vital Signs    Vitals:   12/26/20 2127 12/26/20 2311 12/27/20 0314 12/27/20 0810  BP: 114/69 108/68 111/74 119/71  Pulse:  93 70 87  Resp:  15 16 16   Temp:  98.6 F (37 C) 98 F (36.7 C) 98.1 F (36.7 C)  TempSrc:  Oral Oral Oral  SpO2:  96% 97% 97%  Weight:      Height:        Intake/Output Summary (Last 24 hours) at 12/27/2020 1033 Last data filed at 12/27/2020 0820 Gross per 24 hour  Intake 1276.07 ml  Output 110 ml  Net 1166.07 ml   Last 3 Weights 12/24/2020 12/22/2020 11/28/2020  Weight (lbs) 195 lb 200 lb 1 oz 200 lb  Weight (kg) 88.451 kg 90.748 kg 90.719 kg       Telemetry    AFib w RVR about 12 hours yesterday. Converted to STachy/NSR since about 2300h - Personally Reviewed  ECG    AFib w RVR, otw normal - Personally Reviewed  Physical Exam  He was comfortable both lying flat and sitting up in bed GEN: No acute distress.   Neck: No JVD Cardiac: RRR, no murmurs, rubs, or gallops.  Respiratory: Clear to auscultation bilaterally. GI: Soft, nontender, non-distended  MS: No edema; No deformity. Neuro:  Nonfocal  Psych: Normal affect   Labs    High Sensitivity Troponin:  No results for input(s): TROPONINIHS in the last 720 hours.    Chemistry Recent Labs  Lab 12/22/20 1430 12/25/20 0041 12/26/20 0015  NA 135 134* 137  K 4.1 4.2 4.1  CL 105 103 105  CO2 21* 23 26  GLUCOSE 105* 148* 109*  BUN 21 22 23   CREATININE 1.13 1.35* 0.99  CALCIUM 9.5 8.7* 8.8*  PROT 8.0  --  5.8*  ALBUMIN 4.5  --  3.2*  AST 24  --  20  ALT 32  --  15  ALKPHOS 66  --  48  BILITOT 1.1  --  0.9  GFRNONAA >60 58* >60  ANIONGAP 9 8 6      Hematology Recent Labs  Lab 12/22/20 1430 12/25/20 0041 12/26/20 0015  WBC 12.1* 17.2* 13.5*  RBC 4.97 4.13* 3.94*  HGB 14.9 12.4* 12.1*  HCT 45.1 38.0* 36.3*  MCV 90.7 92.0 92.1  MCH 30.0 30.0 30.7  MCHC 33.0 32.6 33.3  RDW 12.8 13.0 13.2  PLT 272 190 174    BNPNo results for input(s): BNP, PROBNP in the last 168 hours.   DDimer No results for input(s): DDIMER in the last 168 hours.   Radiology    DG CHEST PORT 1 VIEW  Result Date: 12/26/2020 CLINICAL DATA:  Chest tube in place for pneumothorax EXAM: PORTABLE CHEST 1 VIEW COMPARISON:  December 26, 2020 study obtained earlier in the day. FINDINGS: Chest tube position unchanged. No evident pneumothorax. There is slight left base atelectasis. Lungs elsewhere are clear. Heart size and pulmonary vascularity are normal. Patient is status post internal mammary bypass grafting. There is aortic atherosclerosis. No adenopathy. There is postoperative change in the  lower cervical region. IMPRESSION: Chest tube on the right, unchanged. No pneumothorax. Mild left base atelectasis. Lungs otherwise clear. Stable cardiac silhouette. Aortic Atherosclerosis (ICD10-I70.0). Electronically Signed   By: Lowella Grip III M.D.   On: 12/26/2020 13:22   DG CHEST PORT 1 VIEW  Result Date: 12/26/2020 CLINICAL DATA:  Chest tube in place for recent pneumothorax EXAM: PORTABLE CHEST 1 VIEW COMPARISON:  December 25, 2020 FINDINGS: Chest tube on the right unchanged in position. No demonstrable pneumothorax. There is mild bibasilar atelectasis. No edema or airspace opacity. Heart size and pulmonary vascular normal. Status post coronary artery bypass grafting. Postoperative change at cervicothoracic junction. IMPRESSION: Stable chest tube positioning on the right without evident pneumothorax. Bibasilar atelectasis. No edema or airspace opacity. Stable cardiac silhouette. Electronically Signed   By: Lowella Grip III M.D.   On: 12/26/2020 07:59    Cardiac Studies   Event Monitor 06/13/2018:  Sinus rhtyhm predominantly. Occasional sinus bradycardia and sinus tachycardia.  No atrial fibrillation.  No pathologic arrhtyhmias.  No cardiac cuase of syncope identified. _______________  Echocardiogram 09/28/2018: Impressions: 1. The left ventricle has normal systolic function of 44-81%. The cavity  size is normal. There is normal left ventricular hypertrophy. Echo evidence of normal diastolic filling patterns.  2. Normal left atrial size.  3. Normal right atrial size.  4. Normal tricuspid valve.  5. Tricuspid regurgitation is mild.  6. No atrial level shunt detected by color flow Doppler. _______________  Left Heart Catheterization 04/11/2019:  Mid LAD lesion is 55% stenosed.  Ost LAD to Prox LAD lesion is 50% stenosed. LIMA to LAD is patent.  Ost Ramus lesion is 90% stenosed.  Ramus lesion is 75% stenosed.  Ost Cx lesion is 80% stenosed.  Prox Cx to Mid  Cx lesion is 50% stenosed. SVG to OM is patent.  Dist RCA lesion is 50% stenosed.  RPDA lesion is 40% stenosed. SVG to PDA is patent with proximal disease that is unchanged.  Previously placed Ost RCA to Prox RCA drug eluting stent is widely patent.  Graft to the diagonal was occluded. There was TIMI 3 flow in the small diagonal with proximal ectasia.  The left ventricular systolic function is normal.  LV end diastolic pressure is normal. LVEDP 11 mm Hg.  The left ventricular ejection fraction is 50-55% by visual estimate.  There is no aortic valve stenosis.   Patient Profile     65 y.o. 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 -Normal electrolytes, most recent echo in December 2020 showed normal left and right atrial sizes and no significant structural cardiac abnormalities. -We will plan amiodarone orally for about a month after surgery. - Continue Toprol-XL 25mg  daily. - CHA2DS2-VASc score = 6 (CAD, CHF, HTN, stroke x2, age). However, given occurred in post-op setting, will not start anticoagulation right now. If patient does not convert back to sinus rhythm in the next 24-48 hours, consider starting Eliquis.    CAD -S/p anterior STEMI and emergent CABG in 2017 with multiple subsequent cardiac catheterizations and PCI to RCA in 09/2017. He has recurrent chest pain and has  had multipel ED visit and hospitalization for this. Typically felt to be GI related. Recent PET scan suggestive of possible esophagitis.  - No evidence of ischemic ST-T changes during tachycardia/atrial fibrillation - Continue dual antiplatelel therapy with Aspirin and Plavix.  If we start chronic anticoagulation, will switch to clopidogrel plus Eliquis. - Continue Amlodipine 10mg  daily, Ranexa 1,000mg  twice daily, Toprol-XL 25mg  daily, and Lipitor 80mg  daily.  Bilateral Carotid Artery Disease - Carotid ultrasounds in 02/2018 showed 40-59% stenosis of bilateral ICAs.  - Continue DAPT and high-intensity statin. - Needs repeat dopplers. But this can be done as outpatient.  Hypertension - BPwell controlled. -Continue Amlodipine 10mg  daily and Toprol-XL 25mg  daily. - Home Lisinopril stopped yesterday due to mild bump in creatinine. Back to baseline today. Can restart if needed.  Hyperlipidemia - Excellent LDL of 28, rather low HDL at 35.  Overall good lipid profile.  Dose atorvastatin.     For questions or updates, please contact [place] Please consult [email or web address] for contact info under        Signed, Sanda Klein, MD  12/27/2020, 10:33 AM

## 2020-12-27 NOTE — Progress Notes (Signed)
Patient questioned the continous amio gtt this afternoon, states that Dr Sallyanne Kuster had conversation in his room this morning stating that amio gtt would stop--I have looked at Dr [employer] note and it looks like he did have that consideration, but order was never placed and PO med was never ordered, I paged Dr Marcelle Smiling to look and see if he could help with this discretion, Dr Marcelle Smiling stated he has reviewed and it is unclear at this time if gtt should be stopped, he will make a note for the team to review in the am, and leave amio gtt going for now.

## 2020-12-27 NOTE — Plan of Care (Signed)
  Problem: Education: Goal: Knowledge of General Education information will improve Description: Including pain rating scale, medication(s)/side effects and non-pharmacologic comfort measures Outcome: Progressing   Problem: Health Behavior/Discharge Planning: Goal: Ability to manage health-related needs will improve Outcome: Progressing   Problem: Clinical Measurements: Goal: Ability to maintain clinical measurements within normal limits will improve Outcome: Progressing   Problem: Clinical Measurements: Goal: Diagnostic test results will improve Outcome: Progressing   Problem: Clinical Measurements: Goal: Cardiovascular complication will be avoided Outcome: Progressing   Problem: Activity: Goal: Risk for activity intolerance will decrease Outcome: Progressing   Problem: Nutrition: Goal: Adequate nutrition will be maintained Outcome: Progressing   Problem: Coping: Goal: Level of anxiety will decrease Outcome: Progressing   Problem: Pain Managment: Goal: General experience of comfort will improve Outcome: Progressing   Problem: Skin Integrity: Goal: Risk for impaired skin integrity will decrease Outcome: Progressing

## 2020-12-27 NOTE — Progress Notes (Signed)
  Echocardiogram 2D Echocardiogram has been performed.  [name] 12/27/2020, 2:26 PM

## 2020-12-27 NOTE — Progress Notes (Addendum)
[address]       [place] [address]             [phone]      3 Days Post-Op Procedure(s) (LRB): XI ROBOTIC ASSISTED THORASCOPY-RIGHT UPPER LOBECTOMY (Right) INTERCOSTAL NERVE BLOCK (Right) NODE DISSECTION (Right) Subjective: Had one episode of sharp pains yesterday but overall feels pretty well  Objective: Vital signs in last 24 hours: Temp:  [97.7 F (36.5 C)-98.6 F (37 C)] 98.1 F (36.7 C) (04/30 0810) Pulse Rate:  [70-95] 87 (04/30 0810) Cardiac Rhythm: Normal sinus rhythm (04/30 0810) Resp:  [15-22] 16 (04/30 0810) BP: (108-121)/(68-90) 119/71 (04/30 0810) SpO2:  [95 %-97 %] 97 % (04/30 0810)  Hemodynamic parameters for last 24 hours:    Intake/Output from previous day: 04/29 0701 - 04/30 0700 In: 1036.1 [P.O.:600; I.V.:436.1] Out: 140 [Chest Tube:140] Intake/Output this shift: Total I/O In: 240 [P.O.:240] Out: -    Gen - appears well Lungs- harsh BS in right lung fields Cardiac- RRR Abdomen- benign Extrems- no edema or calf tenderness Incis healing well   Lab Results: Recent Labs    12/25/20 0041 12/26/20 0015  WBC 17.2* 13.5*  HGB 12.4* 12.1*  HCT 38.0* 36.3*  PLT 190 174   BMET:  Recent Labs    12/25/20 0041 12/26/20 0015  NA 134* 137  K 4.2 4.1  CL 103 105  CO2 23 26  GLUCOSE 148* 109*  BUN 22 23  CREATININE 1.35* 0.99  CALCIUM 8.7* 8.8*    PT/INR: No results for input(s): LABPROT, INR in the last 72 hours. ABG    Component Value Date/Time   PHART 7.403 12/22/2020 1408   HCO3 21.3 12/22/2020 1408   TCO2 29 06/23/2018 1038   ACIDBASEDEF 2.7 (H) 12/22/2020 1408   O2SAT 97.0 12/22/2020 1408   CBG (last 3)  No results for input(s): GLUCAP in the last 72 hours.  Meds Scheduled Meds: . acetaminophen  1,000 mg Oral Q6H   Or  . acetaminophen (TYLENOL) oral liquid 160 mg/5 mL  1,000 mg Oral Q6H  . amLODipine  10 mg Oral Daily  . aspirin EC  81 mg Oral Daily  . atorvastatin  80 mg Oral Daily  .  bisacodyl  10 mg Oral Daily  . cloNIDine  0.1 mg Oral QHS  . clopidogrel  75 mg Oral Daily  . ketorolac  30 mg Intravenous Q6H  . metoprolol succinate  25 mg Oral Daily  . senna-docusate  1 tablet Oral QHS   Continuous Infusions: . sodium chloride    . amiodarone Stopped (12/27/20 0820)   PRN Meds:.Place/Maintain arterial line **AND** sodium chloride, alum & mag hydroxide-simeth, chlordiazePOXIDE, fluticasone, HYDROmorphone (DILAUDID) injection, ondansetron (ZOFRAN) IV, oxyCODONE-acetaminophen  Xrays DG CHEST PORT 1 VIEW  Result Date: 12/26/2020 CLINICAL DATA:  Chest tube in place for pneumothorax EXAM: PORTABLE CHEST 1 VIEW COMPARISON:  December 26, 2020 study obtained earlier in the day. FINDINGS: Chest tube position unchanged. No evident pneumothorax. There is slight left base atelectasis. Lungs elsewhere are clear. Heart size and pulmonary vascularity are normal. Patient is status post internal mammary bypass grafting. There is aortic atherosclerosis. No adenopathy. There is postoperative change in the lower cervical region. IMPRESSION: Chest tube on the right, unchanged. No pneumothorax. Mild left base atelectasis. Lungs otherwise clear. Stable cardiac silhouette. Aortic Atherosclerosis (ICD10-I70.0). Electronically Signed   By: Lowella Grip III M.D.   On: 12/26/2020 13:22   DG CHEST PORT 1 VIEW  Result Date: 12/26/2020 CLINICAL DATA:  Chest tube in place for recent pneumothorax EXAM: PORTABLE CHEST 1 VIEW COMPARISON:  December 25, 2020 FINDINGS: Chest tube on the right unchanged in position. No demonstrable pneumothorax. There is mild bibasilar atelectasis. No edema or airspace opacity. Heart size and pulmonary vascular normal. Status post coronary artery bypass grafting. Postoperative change at cervicothoracic junction. IMPRESSION: Stable chest tube positioning on the right without evident pneumothorax. Bibasilar atelectasis. No edema or airspace opacity. Stable cardiac silhouette.  Electronically Signed   By: Lowella Grip III M.D.   On: 12/26/2020 07:59    Assessment/Plan: S/P Procedure(s) (LRB): XI ROBOTIC ASSISTED THORASCOPY-RIGHT UPPER LOBECTOMY (Right) INTERCOSTAL NERVE BLOCK (Right) NODE DISSECTION (Right)  1 afeb, VSS, back in sinus from afib with RVR- appreciate cardiol assist 2 sats good on RA 3 CXR not ordered- will obtain 4 TSH in normal range 5 cholesterol panel shows low values 6 CT mod size air leak, minor sub Q air right pectoral, check CXR and keep to H2O seal for now 7 some constipation- will add MOM 8 requests ultram, has taken in past without problem   Addendum: CXR appears stable, no pntx or significant  increase in sub q air   LOS: 3 days    John Giovanni PA-C Pager [phone] 12/27/2020   Agree with above. Atrial fibrillation improved. Patient continues to have a small air leak.  Chest x-ray stable.  Continue pulmonary toilet and chest orders  [place]

## 2020-12-28 ENCOUNTER — Inpatient Hospital Stay (HOSPITAL_COMMUNITY): Payer: Medicare Other

## 2020-12-28 DIAGNOSIS — Z902 Acquired absence of lung [part of]: Secondary | ICD-10-CM | POA: Diagnosis not present

## 2020-12-28 DIAGNOSIS — R911 Solitary pulmonary nodule: Secondary | ICD-10-CM | POA: Diagnosis not present

## 2020-12-28 DIAGNOSIS — I480 Paroxysmal atrial fibrillation: Secondary | ICD-10-CM | POA: Diagnosis not present

## 2020-12-28 DIAGNOSIS — I25118 Atherosclerotic heart disease of native coronary artery with other forms of angina pectoris: Secondary | ICD-10-CM | POA: Diagnosis not present

## 2020-12-28 LAB — CBC
HCT: 36 % — ABNORMAL LOW (ref 39.0–52.0)
Hemoglobin: 11.8 g/dL — ABNORMAL LOW (ref 13.0–17.0)
MCH: 30.1 pg (ref 26.0–34.0)
MCHC: 32.8 g/dL (ref 30.0–36.0)
MCV: 91.8 fL (ref 80.0–100.0)
Platelets: 208 10*3/uL (ref 150–400)
RBC: 3.92 MIL/uL — ABNORMAL LOW (ref 4.22–5.81)
RDW: 13.2 % (ref 11.5–15.5)
WBC: 11.7 10*3/uL — ABNORMAL HIGH (ref 4.0–10.5)
nRBC: 0 % (ref 0.0–0.2)

## 2020-12-28 LAB — BASIC METABOLIC PANEL
Anion gap: 7 (ref 5–15)
BUN: 24 mg/dL — ABNORMAL HIGH (ref 8–23)
CO2: 26 mmol/L (ref 22–32)
Calcium: 8.7 mg/dL — ABNORMAL LOW (ref 8.9–10.3)
Chloride: 102 mmol/L (ref 98–111)
Creatinine, Ser: 1.13 mg/dL (ref 0.61–1.24)
GFR, Estimated: >60 mL/min (ref >60)
Glucose, Bld: 114 mg/dL — ABNORMAL HIGH (ref 70–99)
Potassium: 4.1 mmol/L (ref 3.5–5.1)
Sodium: 135 mmol/L (ref 135–145)

## 2020-12-28 MED ORDER — LOPERAMIDE HCL 2 MG PO CAPS
2.0000 mg | ORAL_CAPSULE | ORAL | Status: DC | PRN
  Administered 2020-12-28: 2 mg via ORAL
  Filled 2020-12-28: qty 1

## 2020-12-28 MED ORDER — AMIODARONE HCL 200 MG PO TABS
200.0000 mg | ORAL_TABLET | Freq: Every day | ORAL | Status: DC
  Administered 2020-12-28 – 2021-01-07 (×11): 200 mg via ORAL
  Filled 2020-12-28 (×11): qty 1

## 2020-12-28 NOTE — Progress Notes (Signed)
Progress Note  Patient Name: [name] Date of Encounter: 12/28/2020  [facility] Cardiologist: Larae Grooms, MD   Subjective   Feeling well.  Has maintained sinus rhythm.  Chest tube still draining.  Inpatient Medications    Scheduled Meds: . acetaminophen  1,000 mg Oral Q6H   Or  . acetaminophen (TYLENOL) oral liquid 160 mg/5 mL  1,000 mg Oral Q6H  . amLODipine  10 mg Oral Daily  . aspirin EC  81 mg Oral Daily  . atorvastatin  80 mg Oral Daily  . bisacodyl  10 mg Oral Daily  . cloNIDine  0.1 mg Oral QHS  . clopidogrel  75 mg Oral Daily  . ketorolac  30 mg Intravenous Q6H  . metoprolol succinate  25 mg Oral Daily  . ranolazine  1,000 mg Oral BID  . senna-docusate  1 tablet Oral QHS   Continuous Infusions: . sodium chloride    . amiodarone 30 mg/hr (12/28/20 0233)   PRN Meds: Place/Maintain arterial line **AND** sodium chloride, alum & mag hydroxide-simeth, chlordiazePOXIDE, fluticasone, HYDROmorphone (DILAUDID) injection, magnesium hydroxide, ondansetron (ZOFRAN) IV, oxyCODONE-acetaminophen, traMADol   Vital Signs    Vitals:   12/27/20 2327 12/28/20 0357 12/28/20 0730 12/28/20 0800  BP: 107/60 120/80 98/66 111/65  Pulse: 69 67 68   Resp: 20 17 18 18   Temp: 98.2 F (36.8 C) 98 F (36.7 C) 98.1 F (36.7 C)   TempSrc: Oral Oral Oral   SpO2: 95% 98% 98%   Weight:      Height:        Intake/Output Summary (Last 24 hours) at 12/28/2020 0944 Last data filed at 12/28/2020 0733 Gross per 24 hour  Intake 1740.52 ml  Output 705 ml  Net 1035.52 ml   Last 3 Weights 12/24/2020 12/22/2020 11/28/2020  Weight (lbs) 195 lb 200 lb 1 oz 200 lb  Weight (kg) 88.451 kg 90.748 kg 90.719 kg      Telemetry    Normal sinus rhythm- Personally Reviewed  ECG    No new tracing - Personally Reviewed  Physical Exam   GEN: No acute distress.   Neck: No JVD Cardiac: RRR, no murmurs, rubs, or gallops.  Respiratory: Clear to auscultation bilaterally. GI: Soft,  nontender, non-distended  MS: No edema; No deformity. Neuro:  Nonfocal  Psych: Normal affect   Labs    High Sensitivity Troponin:  No results for input(s): TROPONINIHS in the last 720 hours.    Chemistry Recent Labs  Lab 12/22/20 1430 12/25/20 0041 12/26/20 0015 12/28/20 0026  NA 135 134* 137 135  K 4.1 4.2 4.1 4.1  CL 105 103 105 102  CO2 21* 23 26 26   GLUCOSE 105* 148* 109* 114*  BUN 21 22 23  24*  CREATININE 1.13 1.35* 0.99 1.13  CALCIUM 9.5 8.7* 8.8* 8.7*  PROT 8.0  --  5.8*  --   ALBUMIN 4.5  --  3.2*  --   AST 24  --  20  --   ALT 32  --  15  --   ALKPHOS 66  --  48  --   BILITOT 1.1  --  0.9  --   GFRNONAA >60 58* >60 >60  ANIONGAP 9 8 6 7      Hematology Recent Labs  Lab 12/25/20 0041 12/26/20 0015 12/28/20 0026  WBC 17.2* 13.5* 11.7*  RBC 4.13* 3.94* 3.92*  HGB 12.4* 12.1* 11.8*  HCT 38.0* 36.3* 36.0*  MCV 92.0 92.1 91.8  MCH 30.0 30.7 30.1  MCHC 32.6 33.3 32.8  RDW 13.0 13.2 13.2  PLT 190 174 208    BNPNo results for input(s): BNP, PROBNP in the last 168 hours.   DDimer No results for input(s): DDIMER in the last 168 hours.   Radiology    DG CHEST PORT 1 VIEW  Result Date: 12/28/2020 CLINICAL DATA:  Right upper lobectomy. EXAM: PORTABLE CHEST 1 VIEW COMPARISON:  Multiple chest x-rays since December 22, 2020. FINDINGS: The right chest tube is stable. There may be a pleural stripe seen at the right apex, possibly representing a pneumothorax. Lung markings are seen on both sides however suggesting the pneumothorax could either be anteriorly or posteriorly located. Recommend continued attention on follow-up. CT imaging could better delineate if clinically warranted. Air in the right chest wall stable. The heart, hila, and lungs are otherwise unchanged and unremarkable. IMPRESSION: 1. A right chest tube remains in place. 2. There may be and anterior or posterior pneumothorax at the apex with a possible pleural stripe. If this represents a pneumothorax, it  measures approximately 4.2 cm at the apex. This may have been present yesterday in retrospect measuring 3 cm. Recommend attention on follow-up. CT imaging could better delineate if clinically warranted. These results will be called to the ordering clinician or representative by the Radiologist Assistant, and communication documented in the PACS or [employer]. Electronically Signed   By: Dorise Bullion III M.D   On: 12/28/2020 08:39   DG CHEST PORT 1 VIEW  Result Date: 12/27/2020 CLINICAL DATA:  Right upper lobectomy EXAM: PORTABLE CHEST 1 VIEW COMPARISON:  12/26/2020 FINDINGS: Right apically directed chest tube. No definite pneumothorax. Stable mild elevation of the right hemidiaphragm secondary to volume loss. No new consolidation or edema. Stable cardiomediastinal contours. Post CABG. IMPRESSION: Right apical chest tube.  No pneumothorax.  Stable lung aeration. Electronically Signed   By: Macy Mis M.D.   On: 12/27/2020 12:37   DG CHEST PORT 1 VIEW  Result Date: 12/26/2020 CLINICAL DATA:  Chest tube in place for pneumothorax EXAM: PORTABLE CHEST 1 VIEW COMPARISON:  December 26, 2020 study obtained earlier in the day. FINDINGS: Chest tube position unchanged. No evident pneumothorax. There is slight left base atelectasis. Lungs elsewhere are clear. Heart size and pulmonary vascularity are normal. Patient is status post internal mammary bypass grafting. There is aortic atherosclerosis. No adenopathy. There is postoperative change in the lower cervical region. IMPRESSION: Chest tube on the right, unchanged. No pneumothorax. Mild left base atelectasis. Lungs otherwise clear. Stable cardiac silhouette. Aortic Atherosclerosis (ICD10-I70.0). Electronically Signed   By: Lowella Grip III M.D.   On: 12/26/2020 13:22   ECHOCARDIOGRAM COMPLETE  Result Date: 12/27/2020    ECHOCARDIOGRAM REPORT   Patient Name:   [name] [employer] Date of Exam: 12/27/2020 Medical Rec #:  [id number]             Height:        69.0 in Accession #:    [id number]            Weight:       195.0 lb Date of Birth:  1956-01-07              BSA:          2.044 m Patient Age:    65 years              BP:           110/71 mmHg Patient Gender: M  HR:           78 bpm. Exam Location:  Inpatient Procedure: 2D Echo, Cardiac Doppler and Color Doppler Indications:    Atrial fibrillation  History:        Patient has prior history of Echocardiogram examinations, most                 recent 09/28/2018. Cardiomyopathy, CAD and Previous Myocardial                 Infarction, Prior CABG, Carotid Disease and Stroke,                 Arrythmias:Atrial Fibrillation; Risk Factors:Former Smoker,                 Hypertension and Dyslipidemia. Post op Right upper lobectomy,                 lung mass.  Sonographer:    Dustin Flock Referring Phys: [id number] [place]  1. Left ventricular ejection fraction, by estimation, is 55 to 60%. The left ventricle has normal function. The left ventricle has no regional wall motion abnormalities. Left ventricular diastolic parameters are consistent with Grade I diastolic dysfunction (impaired relaxation).  2. Right ventricular systolic function is normal. The right ventricular size is normal.  3. The mitral valve is normal in structure. No evidence of mitral valve regurgitation. No evidence of mitral stenosis.  4. The aortic valve is normal in structure. Aortic valve regurgitation is not visualized. No aortic stenosis is present.  5. The inferior vena cava is normal in size with greater than 50% respiratory variability, suggesting right atrial pressure of 3 mmHg. Comparison(s): No significant change from prior study. FINDINGS  Left Ventricle: Left ventricular ejection fraction, by estimation, is 55 to 60%. The left ventricle has normal function. The left ventricle has no regional wall motion abnormalities. The left ventricular internal cavity size was normal in size. There is  no left  ventricular hypertrophy. Left ventricular diastolic parameters are consistent with Grade I diastolic dysfunction (impaired relaxation). Right Ventricle: The right ventricular size is normal. No increase in right ventricular wall thickness. Right ventricular systolic function is normal. Left Atrium: Left atrial size was normal in size. Right Atrium: Right atrial size was normal in size. Pericardium: There is no evidence of pericardial effusion. Mitral Valve: The mitral valve is normal in structure. No evidence of mitral valve regurgitation. No evidence of mitral valve stenosis. Tricuspid Valve: The tricuspid valve is normal in structure. Tricuspid valve regurgitation is not demonstrated. No evidence of tricuspid stenosis. Aortic Valve: The aortic valve is normal in structure. Aortic valve regurgitation is not visualized. No aortic stenosis is present. Pulmonic Valve: The pulmonic valve was normal in structure. Pulmonic valve regurgitation is trivial. No evidence of pulmonic stenosis. Aorta: The aortic root is normal in size and structure. Venous: The inferior vena cava is normal in size with greater than 50% respiratory variability, suggesting right atrial pressure of 3 mmHg. IAS/Shunts: No atrial level shunt detected by color flow Doppler.  LEFT VENTRICLE PLAX 2D LVIDd:         4.40 cm  Diastology LVIDs:         3.00 cm  LV e' medial:    8.38 cm/s LV PW:         1.10 cm  LV E/e' medial:  8.0 LV IVS:        1.10 cm  LV e' lateral:   8.81 cm/s LVOT  diam:     2.30 cm  LV E/e' lateral: 7.6 LV SV:         49 LV SV Index:   24 LVOT Area:     4.15 cm  RIGHT VENTRICLE RV Basal diam:  2.70 cm RV S prime:     4.57 cm/s TAPSE (M-mode): 2.2 cm LEFT ATRIUM             Index       RIGHT ATRIUM           Index LA diam:        3.10 cm 1.52 cm/m  RA Area:     12.50 cm LA Vol (A2C):   24.7 ml 12.08 ml/m RA Volume:   31.00 ml  15.17 ml/m LA Vol (A4C):   21.5 ml 10.52 ml/m LA Biplane Vol: 23.1 ml 11.30 ml/m  AORTIC VALVE LVOT  Vmax:   76.40 cm/s LVOT Vmean:  49.800 cm/s LVOT VTI:    0.119 m  AORTA Ao Root diam: 2.90 cm MITRAL VALVE MV Area (PHT): 4.06 cm    SHUNTS MV Decel Time: 187 msec    Systemic VTI:  0.12 m MV E velocity: 67.30 cm/s  Systemic Diam: 2.30 cm MV A velocity: 77.10 cm/s MV E/A ratio:  0.87 Candee Furbish MD Electronically signed by Candee Furbish MD Signature Date/Time: 12/27/2020/2:47:05 PM    Final     Cardiac Studies   Echocardiogram performed yesterday does not show any structural cardiac abnormalities.  Normal-sized left atrium.  Normal left ventricular systolic function.  Diastolic dysfunction described as "grade 1" but parameters actually normal for his age.  No significant valvular abnormalities.  Left Heart Catheterization 04/11/2019:  Mid LAD lesion is 55% stenosed.  Ost LAD to Prox LAD lesion is 50% stenosed. LIMA to LAD is patent.  Ost Ramus lesion is 90% stenosed.  Ramus lesion is 75% stenosed.  Ost Cx lesion is 80% stenosed.  Prox Cx to Mid Cx lesion is 50% stenosed. SVG to OM is patent.  Dist RCA lesion is 50% stenosed.  RPDA lesion is 40% stenosed. SVG to PDA is patent with proximal disease that is unchanged.  Previously placed Ost RCA to Prox RCA drug eluting stent is widely patent.  Graft to the diagonal was occluded. There was TIMI 3 flow in the small diagonal with proximal ectasia.  The left ventricular systolic function is normal.  LV end diastolic pressure is normal. LVEDP 11 mm Hg.  The left ventricular ejection fraction is 50-55% by visual estimate.  There is no aortic valve stenosis.   Patient Profile     65 y.o. male with transient postoperative atrial fibrillation in the setting of right upper lobectomy, on a background history of CAD s/p anterior STEMI and emergent CABG in 2017 with multiple subsequent cardiac catheterizations and PCI to RCA in 09/2017, bilateral carotid artery disease, stroke in 10/2016, ischemic cardiomyopathy with EF of 55-60%, hypertension,  hyperlipidemia, former tobacco abuse, hepatitis C, anxiety, and depression  Assessment & Plan    1.  Postoperative atrial fibrillation: Has resolved on intravenous amiodarone.  Low likelihood of recurrence with normal LVEF, normal left atrial size, no significant valvular abnormalities.  Nevertheless, he has a history of stroke (albeit with carotid disease) and it would be critically important to make sure we are not missing other episodes of atrial fibrillation.  Will send home with an extended arrhythmia monitor.  Plan amiodarone briefly for a few weeks after surgery.  CHA2DS2-VASc score =  6 (CAD, CHF, HTN, stroke x2, age). We will plan to place a Zio patch on at the time of discharge. 2. CAD: No recent angina pectoris.  Sometimes symptoms are hard to distinguish from GERD.  He is on dual antiplatelet therapy.  Plan to continue this for now, but if recurrent atrial fibrillation is shown, would plan long-term Eliquis plus clopidogrel. Continue Amlodipine 10mg  daily, Ranexa 1,000mg  twice daily, Toprol-XL 25mg  daily, and Lipitor 80mg  daily. 3. Bilateral Carotid Artery Disease Carotid ultrasounds in 02/2018 showed 40-59% stenosis of bilateral ICAs. Continue DAPT and high-intensity statin. Needs repeat dopplers, but this can be done as outpatient.   4. Hypertension BP well controlled. Home Lisinopril stopped due to mild bump in creatinine, now back to baseline. Can restart at DC. 5. Hyperlipidemia Excellent LDL of 28, rather low HDL at 35.  Overall good lipid profile.  Hi-dose atorvastatin.      For questions or updates, please contact [place] Please consult [email or web address] for contact info under        Signed, Sanda Klein, MD  12/28/2020, 9:44 AM

## 2020-12-28 NOTE — Progress Notes (Addendum)
[address] ChaseSuite [address]       [place] [address]             [phone]      4 Days Post-Op Procedure(s) (LRB): XI ROBOTIC ASSISTED THORASCOPY-RIGHT UPPER LOBECTOMY (Right) INTERCOSTAL NERVE BLOCK (Right) NODE DISSECTION (Right) Subjective: Feels poorly today Not SOB  Objective: Vital signs in last 24 hours: Temp:  [97.7 F (36.5 C)-98.9 F (37.2 C)] 98.1 F (36.7 C) (05/01 0730) Pulse Rate:  [67-78] 68 (05/01 0730) Cardiac Rhythm: Normal sinus rhythm (05/01 0700) Resp:  [13-20] 18 (05/01 0800) BP: (98-132)/(60-86) 111/65 (05/01 0800) SpO2:  [94 %-98 %] 98 % (05/01 0730)  Hemodynamic parameters for last 24 hours:    Intake/Output from previous day: 04/30 0701 - 05/01 0700 In: 1740.5 [P.O.:1320; I.V.:420.5] Out: 705 [Urine:500; Chest Tube:205] Intake/Output this shift: Total I/O In: 240 [P.O.:240] Out: -   General appearance: alert, cooperative and no distress Heart: regular rate and rhythm Lungs: coarse BS on Right Abdomen: benign Extremities: no edema or calf tenderness Wound: incis healing well  Lab Results: Recent Labs    12/26/20 0015 12/28/20 0026  WBC 13.5* 11.7*  HGB 12.1* 11.8*  HCT 36.3* 36.0*  PLT 174 208   BMET:  Recent Labs    12/26/20 0015 12/28/20 0026  NA 137 135  K 4.1 4.1  CL 105 102  CO2 26 26  GLUCOSE 109* 114*  BUN 23 24*  CREATININE 0.99 1.13  CALCIUM 8.8* 8.7*    PT/INR: No results for input(s): LABPROT, INR in the last 72 hours. ABG    Component Value Date/Time   PHART 7.403 12/22/2020 1408   HCO3 21.3 12/22/2020 1408   TCO2 29 06/23/2018 1038   ACIDBASEDEF 2.7 (H) 12/22/2020 1408   O2SAT 97.0 12/22/2020 1408   CBG (last 3)  No results for input(s): GLUCAP in the last 72 hours.  Meds Scheduled Meds: . acetaminophen  1,000 mg Oral Q6H   Or  . acetaminophen (TYLENOL) oral liquid 160 mg/5 mL  1,000 mg Oral Q6H  . amLODipine  10 mg Oral Daily  . aspirin EC  81 mg Oral Daily  . atorvastatin  80  mg Oral Daily  . bisacodyl  10 mg Oral Daily  . cloNIDine  0.1 mg Oral QHS  . clopidogrel  75 mg Oral Daily  . ketorolac  30 mg Intravenous Q6H  . metoprolol succinate  25 mg Oral Daily  . ranolazine  1,000 mg Oral BID  . senna-docusate  1 tablet Oral QHS   Continuous Infusions: . sodium chloride    . amiodarone 30 mg/hr (12/28/20 0233)   PRN Meds:.Place/Maintain arterial line **AND** sodium chloride, alum & mag hydroxide-simeth, chlordiazePOXIDE, fluticasone, HYDROmorphone (DILAUDID) injection, magnesium hydroxide, ondansetron (ZOFRAN) IV, oxyCODONE-acetaminophen, traMADol  Xrays DG CHEST PORT 1 VIEW  Result Date: 12/28/2020 CLINICAL DATA:  Right upper lobectomy. EXAM: PORTABLE CHEST 1 VIEW COMPARISON:  Multiple chest x-rays since December 22, 2020. FINDINGS: The right chest tube is stable. There may be a pleural stripe seen at the right apex, possibly representing a pneumothorax. Lung markings are seen on both sides however suggesting the pneumothorax could either be anteriorly or posteriorly located. Recommend continued attention on follow-up. CT imaging could better delineate if clinically warranted. Air in the right chest wall stable. The heart, hila, and lungs are otherwise unchanged and unremarkable. IMPRESSION: 1. A right chest tube remains in place. 2. There may be and anterior or posterior pneumothorax at  the apex with a possible pleural stripe. If this represents a pneumothorax, it measures approximately 4.2 cm at the apex. This may have been present yesterday in retrospect measuring 3 cm. Recommend attention on follow-up. CT imaging could better delineate if clinically warranted. These results will be called to the ordering clinician or representative by the Radiologist Assistant, and communication documented in the PACS or [employer]. Electronically Signed   By: Dorise Bullion III M.D   On: 12/28/2020 08:39   DG CHEST PORT 1 VIEW  Result Date: 12/27/2020 CLINICAL DATA:  Right  upper lobectomy EXAM: PORTABLE CHEST 1 VIEW COMPARISON:  12/26/2020 FINDINGS: Right apically directed chest tube. No definite pneumothorax. Stable mild elevation of the right hemidiaphragm secondary to volume loss. No new consolidation or edema. Stable cardiomediastinal contours. Post CABG. IMPRESSION: Right apical chest tube.  No pneumothorax.  Stable lung aeration. Electronically Signed   By: Macy Mis M.D.   On: 12/27/2020 12:37   DG CHEST PORT 1 VIEW  Result Date: 12/26/2020 CLINICAL DATA:  Chest tube in place for pneumothorax EXAM: PORTABLE CHEST 1 VIEW COMPARISON:  December 26, 2020 study obtained earlier in the day. FINDINGS: Chest tube position unchanged. No evident pneumothorax. There is slight left base atelectasis. Lungs elsewhere are clear. Heart size and pulmonary vascularity are normal. Patient is status post internal mammary bypass grafting. There is aortic atherosclerosis. No adenopathy. There is postoperative change in the lower cervical region. IMPRESSION: Chest tube on the right, unchanged. No pneumothorax. Mild left base atelectasis. Lungs otherwise clear. Stable cardiac silhouette. Aortic Atherosclerosis (ICD10-I70.0). Electronically Signed   By: Lowella Grip III M.D.   On: 12/26/2020 13:22   ECHOCARDIOGRAM COMPLETE  Result Date: 12/27/2020    ECHOCARDIOGRAM REPORT   Patient Name:   [name] [facility] Date of Exam: 12/27/2020 Medical Rec #:  [id number]             Height:       69.0 in Accession #:    [id number]            Weight:       195.0 lb Date of Birth:  [birth year]              BSA:          2.044 m Patient Age:    65 years              BP:           110/71 mmHg Patient Gender: M                     HR:           78 bpm. Exam Location:  Inpatient Procedure: 2D Echo, Cardiac Doppler and Color Doppler Indications:    Atrial fibrillation  History:        Patient has prior history of Echocardiogram examinations, most                 recent 09/28/2018. Cardiomyopathy, CAD and  Previous Myocardial                 Infarction, Prior CABG, Carotid Disease and Stroke,                 Arrythmias:Atrial Fibrillation; Risk Factors:Former Smoker,                 Hypertension and Dyslipidemia. Post op Right upper lobectomy,  lung mass.  Sonographer:    Dustin Flock Referring Phys: [id number] [place]  1. Left ventricular ejection fraction, by estimation, is 55 to 60%. The left ventricle has normal function. The left ventricle has no regional wall motion abnormalities. Left ventricular diastolic parameters are consistent with Grade I diastolic dysfunction (impaired relaxation).  2. Right ventricular systolic function is normal. The right ventricular size is normal.  3. The mitral valve is normal in structure. No evidence of mitral valve regurgitation. No evidence of mitral stenosis.  4. The aortic valve is normal in structure. Aortic valve regurgitation is not visualized. No aortic stenosis is present.  5. The inferior vena cava is normal in size with greater than 50% respiratory variability, suggesting right atrial pressure of 3 mmHg. Comparison(s): No significant change from prior study. FINDINGS  Left Ventricle: Left ventricular ejection fraction, by estimation, is 55 to 60%. The left ventricle has normal function. The left ventricle has no regional wall motion abnormalities. The left ventricular internal cavity size was normal in size. There is  no left ventricular hypertrophy. Left ventricular diastolic parameters are consistent with Grade I diastolic dysfunction (impaired relaxation). Right Ventricle: The right ventricular size is normal. No increase in right ventricular wall thickness. Right ventricular systolic function is normal. Left Atrium: Left atrial size was normal in size. Right Atrium: Right atrial size was normal in size. Pericardium: There is no evidence of pericardial effusion. Mitral Valve: The mitral valve is normal in structure. No evidence  of mitral valve regurgitation. No evidence of mitral valve stenosis. Tricuspid Valve: The tricuspid valve is normal in structure. Tricuspid valve regurgitation is not demonstrated. No evidence of tricuspid stenosis. Aortic Valve: The aortic valve is normal in structure. Aortic valve regurgitation is not visualized. No aortic stenosis is present. Pulmonic Valve: The pulmonic valve was normal in structure. Pulmonic valve regurgitation is trivial. No evidence of pulmonic stenosis. Aorta: The aortic root is normal in size and structure. Venous: The inferior vena cava is normal in size with greater than 50% respiratory variability, suggesting right atrial pressure of 3 mmHg. IAS/Shunts: No atrial level shunt detected by color flow Doppler.  LEFT VENTRICLE PLAX 2D LVIDd:         4.40 cm  Diastology LVIDs:         3.00 cm  LV e' medial:    8.38 cm/s LV PW:         1.10 cm  LV E/e' medial:  8.0 LV IVS:        1.10 cm  LV e' lateral:   8.81 cm/s LVOT diam:     2.30 cm  LV E/e' lateral: 7.6 LV SV:         49 LV SV Index:   24 LVOT Area:     4.15 cm  RIGHT VENTRICLE RV Basal diam:  2.70 cm RV S prime:     4.57 cm/s TAPSE (M-mode): 2.2 cm LEFT ATRIUM             Index       RIGHT ATRIUM           Index LA diam:        3.10 cm 1.52 cm/m  RA Area:     12.50 cm LA Vol (A2C):   24.7 ml 12.08 ml/m RA Volume:   31.00 ml  15.17 ml/m LA Vol (A4C):   21.5 ml 10.52 ml/m LA Biplane Vol: 23.1 ml 11.30 ml/m  AORTIC VALVE LVOT Vmax:  76.40 cm/s LVOT Vmean:  49.800 cm/s LVOT VTI:    0.119 m  AORTA Ao Root diam: 2.90 cm MITRAL VALVE MV Area (PHT): 4.06 cm    SHUNTS MV Decel Time: 187 msec    Systemic VTI:  0.12 m MV E velocity: 67.30 cm/s  Systemic Diam: 2.30 cm MV A velocity: 77.10 cm/s MV E/A ratio:  0.87 Candee Furbish MD Electronically signed by Candee Furbish MD Signature Date/Time: 12/27/2020/2:47:05 PM    Final     Assessment/Plan: S/P Procedure(s) (LRB): XI ROBOTIC ASSISTED THORASCOPY-RIGHT UPPER LOBECTOMY (Right) INTERCOSTAL  NERVE BLOCK (Right) NODE DISSECTION (Right)  1 afeb, VSS sinus rhythm- cardiology stop IV amio and place on po- other plans for afib as per cardiology 2 sats good on RA 3 CXR- poss small pntx per radiologist read 4  normal renal fxn 5 leukocytosis trend improved 6 H/H very stable 7 CT - air leak appears larger today than yesterday- may be a mini-express situation at d/c v other interventions- cont conservative management for now- he is more active now.       LOS: 4 days    John Giovanni PA-C Pager [phone] 12/28/2020   Agree with above. Transitioning to p.o. amio. Persistent air leak.  Continue pulmonary toilet for now.  [name]

## 2020-12-28 NOTE — Progress Notes (Addendum)
Patient has had dark tarry looking stools in the last day--slight red streaks of blood, too---I have paged PA Gold x2 to see if he wants to get stool sample--I have now paged Dr Kipp Brood

## 2020-12-28 NOTE — Plan of Care (Signed)
  Problem: Education: Goal: Knowledge of General Education information will improve Description: Including pain rating scale, medication(s)/side effects and non-pharmacologic comfort measures Outcome: Progressing   Problem: Health Behavior/Discharge Planning: Goal: Ability to manage health-related needs will improve Outcome: Progressing   Problem: Clinical Measurements: Goal: Ability to maintain clinical measurements within normal limits will improve Outcome: Progressing   Problem: Clinical Measurements: Goal: Will remain free from infection Outcome: Progressing   Problem: Clinical Measurements: Goal: Diagnostic test results will improve Outcome: Progressing   Problem: Clinical Measurements: Goal: Respiratory complications will improve Outcome: Progressing   Problem: Activity: Goal: Risk for activity intolerance will decrease Outcome: Progressing   Problem: Nutrition: Goal: Adequate nutrition will be maintained Outcome: Progressing   Problem: Coping: Goal: Level of anxiety will decrease Outcome: Progressing   Problem: Pain Managment: Goal: General experience of comfort will improve Outcome: Progressing   Problem: Skin Integrity: Goal: Risk for impaired skin integrity will decrease Outcome: Progressing

## 2020-12-28 NOTE — Hospital Course (Addendum)
  Referring: Garner Nash, DO Primary Care: [name], MD Primary Cardiologist: Larae Grooms, MD   History of Present Illness:    [name] 65 y.o. male referred by Dr. Valeta Harms for surgical evaluation of a right upper lobe pulmonary nodule.  This was found incidentally when he was being worked up for chest pain.  He does have a history of coronary artery bypass grafting performed by Dr. Cyndia Bent in 2017.  Following that date he has been tobacco free.  He was infected with Covid late last year and had symptoms for over 2 months.  He continues to have some pleuritic chest pain and mental fog.  The patient and all relevant studies were evaluated by Dr. Kipp Brood who recommended proceeding with robotic assisted lobectomy.  The patient was admitted to the hospital electively for the procedure.  Hospital course:  The patient was admitted on 12/24/2020 and taken the operating room at which time he underwent a robotic assisted right video thoracoscopy for right upper lobectomy with mediastinal lymph node sampling and intercostal nerve block.  Patient tolerated procedure well was taken to the postanesthesia care unit in stable condition.  Postoperative hospital course:   Patient is overall done well.  He did develop postoperative atrial fibrillation and cardiology was consulted to assist with management.  He was initially placed on intravenous amiodarone and subsequent conversion to p.o.  He is back in normal sinus rhythm.  He has remained hemodynamically stable with the exception of occasional rapid ventricular response.  He has an expected mild acute blood loss anemia which is stable.  Renal function has remained normal.  Oxygen has been weaned and maintains good saturations on room air.  He does have a prolonged air leak which is being monitored clinically.  This did not improve.  He was taken to the operating room on 12/31/2020 for placement of IBV x 2.  He tolerated the procedure without  difficulty.  He continued to have an air leak post procedure.  His chest tube was clamped on 01/01/2021.  Unfortunately he developed a pneumothorax.  His leak persisted.  CXR showed a pneumothorax on the right.  His chest tube was placed back on suction on 01/03/2021.  Follow up CXR showed improvement of right sided pneumothorax.  His air leak also improved.

## 2020-12-29 DIAGNOSIS — R911 Solitary pulmonary nodule: Secondary | ICD-10-CM | POA: Diagnosis not present

## 2020-12-29 DIAGNOSIS — I480 Paroxysmal atrial fibrillation: Secondary | ICD-10-CM | POA: Diagnosis not present

## 2020-12-29 DIAGNOSIS — E785 Hyperlipidemia, unspecified: Secondary | ICD-10-CM

## 2020-12-29 DIAGNOSIS — I25810 Atherosclerosis of coronary artery bypass graft(s) without angina pectoris: Secondary | ICD-10-CM | POA: Diagnosis not present

## 2020-12-29 DIAGNOSIS — I10 Essential (primary) hypertension: Secondary | ICD-10-CM

## 2020-12-29 LAB — CYTOLOGY - NON PAP

## 2020-12-29 MED ORDER — PHENYLEPHRINE HCL-NACL 10-0.9 MG/250ML-% IV SOLN
INTRAVENOUS | Status: AC
  Filled 2020-12-29: qty 250

## 2020-12-29 NOTE — Care Management Important Message (Signed)
Important Message  Patient Details  Name: [name] MRN: [id number] Date of Birth: May 09, 1956   Medicare Important Message Given:  Yes     Orbie Pyo 12/29/2020, 3:32 PM

## 2020-12-29 NOTE — Progress Notes (Signed)
Progress Note  Patient Name: Valmore Arabie Date of Encounter: 12/29/2020  [facility] Cardiologist: Larae Grooms, MD   Subjective   He feels well.  Notes that heart rate is much calmer now when he walks around.  He denies chest pain or dyspnea.  Inpatient Medications    Scheduled Meds: . acetaminophen  1,000 mg Oral Q6H   Or  . acetaminophen (TYLENOL) oral liquid 160 mg/5 mL  1,000 mg Oral Q6H  . amiodarone  200 mg Oral Daily  . amLODipine  10 mg Oral Daily  . aspirin EC  81 mg Oral Daily  . atorvastatin  80 mg Oral Daily  . bisacodyl  10 mg Oral Daily  . cloNIDine  0.1 mg Oral QHS  . clopidogrel  75 mg Oral Daily  . ketorolac  30 mg Intravenous Q6H  . metoprolol succinate  25 mg Oral Daily  . ranolazine  1,000 mg Oral BID  . senna-docusate  1 tablet Oral QHS   Continuous Infusions: . sodium chloride     PRN Meds: Place/Maintain arterial line **AND** sodium chloride, alum & mag hydroxide-simeth, chlordiazePOXIDE, fluticasone, HYDROmorphone (DILAUDID) injection, loperamide, magnesium hydroxide, ondansetron (ZOFRAN) IV, oxyCODONE-acetaminophen, traMADol   Vital Signs    Vitals:   12/28/20 2105 12/28/20 2303 12/29/20 0353 12/29/20 0713  BP: 134/71 (!) 120/59 122/60 135/79  Pulse:  79 77 74  Resp:  16 13 14   Temp:  97.9 F (36.6 C) 97.7 F (36.5 C)   TempSrc:  Oral Oral   SpO2:  99% 99%   Weight:      Height:        Intake/Output Summary (Last 24 hours) at 12/29/2020 0916 Last data filed at 12/29/2020 0713 Gross per 24 hour  Intake 974.74 ml  Output 240 ml  Net 734.74 ml   Last 3 Weights 12/24/2020 12/22/2020 11/28/2020  Weight (lbs) 195 lb 200 lb 1 oz 200 lb  Weight (kg) 88.451 kg 90.748 kg 90.719 kg      Telemetry    Normal sinus rhythm without A. fib in last 24 hours.- Personally Reviewed  ECG    Most recently performed on December 26, 2020 demonstrating atrial fib with controlled rate- Personally Reviewed  Physical Exam  Good skin  color GEN: No acute distress.   Neck: No JVD Cardiac: RRR, no murmurs, rubs, or gallops.  Respiratory: Clear to auscultation bilaterally. GI: Soft, nontender, non-distended  MS: No edema; No deformity. Neuro:  Nonfocal   Labs    High Sensitivity Troponin:  No results for input(s): TROPONINIHS in the last 720 hours.    Chemistry Recent Labs  Lab 12/22/20 1430 12/25/20 0041 12/26/20 0015 12/28/20 0026  NA 135 134* 137 135  K 4.1 4.2 4.1 4.1  CL 105 103 105 102  CO2 21* 23 26 26   GLUCOSE 105* 148* 109* 114*  BUN 21 22 23  24*  CREATININE 1.13 1.35* 0.99 1.13  CALCIUM 9.5 8.7* 8.8* 8.7*  PROT 8.0  --  5.8*  --   ALBUMIN 4.5  --  3.2*  --   AST 24  --  20  --   ALT 32  --  15  --   ALKPHOS 66  --  48  --   BILITOT 1.1  --  0.9  --   GFRNONAA >60 58* >60 >60  ANIONGAP 9 8 6 7      Hematology Recent Labs  Lab 12/25/20 0041 12/26/20 0015 12/28/20 0026  WBC 17.2* 13.5* 11.7*  RBC 4.13* 3.94* 3.92*  HGB 12.4* 12.1* 11.8*  HCT 38.0* 36.3* 36.0*  MCV 92.0 92.1 91.8  MCH 30.0 30.7 30.1  MCHC 32.6 33.3 32.8  RDW 13.0 13.2 13.2  PLT 190 174 208    BNPNo results for input(s): BNP, PROBNP in the last 168 hours.   DDimer No results for input(s): DDIMER in the last 168 hours.   Radiology    DG CHEST PORT 1 VIEW  Result Date: 12/28/2020 CLINICAL DATA:  Right upper lobectomy. EXAM: PORTABLE CHEST 1 VIEW COMPARISON:  Multiple chest x-rays since December 22, 2020. FINDINGS: The right chest tube is stable. There may be a pleural stripe seen at the right apex, possibly representing a pneumothorax. Lung markings are seen on both sides however suggesting the pneumothorax could either be anteriorly or posteriorly located. Recommend continued attention on follow-up. CT imaging could better delineate if clinically warranted. Air in the right chest wall stable. The heart, hila, and lungs are otherwise unchanged and unremarkable. IMPRESSION: 1. A right chest tube remains in place. 2. There  may be and anterior or posterior pneumothorax at the apex with a possible pleural stripe. If this represents a pneumothorax, it measures approximately 4.2 cm at the apex. This may have been present yesterday in retrospect measuring 3 cm. Recommend attention on follow-up. CT imaging could better delineate if clinically warranted. These results will be called to the ordering clinician or representative by the Radiologist Assistant, and communication documented in the PACS or [employer]. Electronically Signed   By: Dorise Bullion III M.D   On: 12/28/2020 08:39   DG CHEST PORT 1 VIEW  Result Date: 12/27/2020 CLINICAL DATA:  Right upper lobectomy EXAM: PORTABLE CHEST 1 VIEW COMPARISON:  12/26/2020 FINDINGS: Right apically directed chest tube. No definite pneumothorax. Stable mild elevation of the right hemidiaphragm secondary to volume loss. No new consolidation or edema. Stable cardiomediastinal contours. Post CABG. IMPRESSION: Right apical chest tube.  No pneumothorax.  Stable lung aeration. Electronically Signed   By: Macy Mis M.D.   On: 12/27/2020 12:37   ECHOCARDIOGRAM COMPLETE  Result Date: 12/27/2020    ECHOCARDIOGRAM REPORT   Patient Name:   [name] [facility] Date of Exam: 12/27/2020 Medical Rec #:  [id number]             Height:       69.0 in Accession #:    [id number]            Weight:       195.0 lb Date of Birth:  [birth year]              BSA:          2.044 m Patient Age:    65 years              BP:           110/71 mmHg Patient Gender: M                     HR:           78 bpm. Exam Location:  Inpatient Procedure: 2D Echo, Cardiac Doppler and Color Doppler Indications:    Atrial fibrillation  History:        Patient has prior history of Echocardiogram examinations, most                 recent 09/28/2018. Cardiomyopathy, CAD and Previous Myocardial  Infarction, Prior CABG, Carotid Disease and Stroke,                 Arrythmias:Atrial Fibrillation; Risk Factors:Former  Smoker,                 Hypertension and Dyslipidemia. Post op Right upper lobectomy,                 lung mass.  Sonographer:    Dustin Flock Referring Phys: [id number] [place]  1. Left ventricular ejection fraction, by estimation, is 55 to 60%. The left ventricle has normal function. The left ventricle has no regional wall motion abnormalities. Left ventricular diastolic parameters are consistent with Grade I diastolic dysfunction (impaired relaxation).  2. Right ventricular systolic function is normal. The right ventricular size is normal.  3. The mitral valve is normal in structure. No evidence of mitral valve regurgitation. No evidence of mitral stenosis.  4. The aortic valve is normal in structure. Aortic valve regurgitation is not visualized. No aortic stenosis is present.  5. The inferior vena cava is normal in size with greater than 50% respiratory variability, suggesting right atrial pressure of 3 mmHg. Comparison(s): No significant change from prior study. FINDINGS  Left Ventricle: Left ventricular ejection fraction, by estimation, is 55 to 60%. The left ventricle has normal function. The left ventricle has no regional wall motion abnormalities. The left ventricular internal cavity size was normal in size. There is  no left ventricular hypertrophy. Left ventricular diastolic parameters are consistent with Grade I diastolic dysfunction (impaired relaxation). Right Ventricle: The right ventricular size is normal. No increase in right ventricular wall thickness. Right ventricular systolic function is normal. Left Atrium: Left atrial size was normal in size. Right Atrium: Right atrial size was normal in size. Pericardium: There is no evidence of pericardial effusion. Mitral Valve: The mitral valve is normal in structure. No evidence of mitral valve regurgitation. No evidence of mitral valve stenosis. Tricuspid Valve: The tricuspid valve is normal in structure. Tricuspid valve  regurgitation is not demonstrated. No evidence of tricuspid stenosis. Aortic Valve: The aortic valve is normal in structure. Aortic valve regurgitation is not visualized. No aortic stenosis is present. Pulmonic Valve: The pulmonic valve was normal in structure. Pulmonic valve regurgitation is trivial. No evidence of pulmonic stenosis. Aorta: The aortic root is normal in size and structure. Venous: The inferior vena cava is normal in size with greater than 50% respiratory variability, suggesting right atrial pressure of 3 mmHg. IAS/Shunts: No atrial level shunt detected by color flow Doppler.  LEFT VENTRICLE PLAX 2D LVIDd:         4.40 cm  Diastology LVIDs:         3.00 cm  LV e' medial:    8.38 cm/s LV PW:         1.10 cm  LV E/e' medial:  8.0 LV IVS:        1.10 cm  LV e' lateral:   8.81 cm/s LVOT diam:     2.30 cm  LV E/e' lateral: 7.6 LV SV:         49 LV SV Index:   24 LVOT Area:     4.15 cm  RIGHT VENTRICLE RV Basal diam:  2.70 cm RV S prime:     4.57 cm/s TAPSE (M-mode): 2.2 cm LEFT ATRIUM             Index       RIGHT ATRIUM  Index LA diam:        3.10 cm 1.52 cm/m  RA Area:     12.50 cm LA Vol (A2C):   24.7 ml 12.08 ml/m RA Volume:   31.00 ml  15.17 ml/m LA Vol (A4C):   21.5 ml 10.52 ml/m LA Biplane Vol: 23.1 ml 11.30 ml/m  AORTIC VALVE LVOT Vmax:   76.40 cm/s LVOT Vmean:  49.800 cm/s LVOT VTI:    0.119 m  AORTA Ao Root diam: 2.90 cm MITRAL VALVE MV Area (PHT): 4.06 cm    SHUNTS MV Decel Time: 187 msec    Systemic VTI:  0.12 m MV E velocity: 67.30 cm/s  Systemic Diam: 2.30 cm MV A velocity: 77.10 cm/s MV E/A ratio:  0.87 Candee Furbish MD Electronically signed by Candee Furbish MD Signature Date/Time: 12/27/2020/2:47:05 PM    Final     Cardiac Studies   No new data  Patient Profile     65 y.o. male with transient postoperative atrial fibrillation in the setting of right upper lobectomy, on a background history of CAD s/p anterior STEMI and emergent CABG in 2017 with multiple subsequent  cardiac catheterizations and PCI to RCA in 09/2017, bilateral carotid artery disease, stroke in 10/2016, ischemic cardiomyopathy with EF of 55-60%, hypertension, hyperlipidemia, former tobacco abuse, hepatitis C, anxiety, and depression.  Feels in retrospect that he has had atrial fibrillation intermittently at home.  Assessment & Plan    1. Paroxysmal atrial fibrillation (CHADS VASC 5): He is stable on amiodarone with good suppression at least over the last 24 hours.  Now on oral amiodarone.  May need to consider ablation at a later date.  With elevated CHA2DS2-VASc, chronic oral anticoagulation therapy is needed unless I am missing some prior bleeding event/high risk of bleeding.  Eliquis would be the choice. 2. Coronary artery disease with prior coronary bypass grafting: Stable without angina.  Secondary prevention continued.  Preventive therapy should include high intensity statin therapy, management of triglycerides if indicated, blood pressure control, and antiplatelet therapy. 3. Carotid artery disease: Preventive therapy as per coronary disease. 4. LDL cholesterol target less than 70.  Already on excellent therapy.  Monitor liver function on amiodarone.      For questions or updates, please contact [place] Please consult [email or web address] for contact info under        Signed, Sinclair Grooms, MD  12/29/2020, 9:17 AM

## 2020-12-29 NOTE — Progress Notes (Addendum)
      [address]       [place] [address]             [phone]       5 Days Post-Op Procedure(s) (LRB): XI ROBOTIC ASSISTED THORASCOPY-RIGHT UPPER LOBECTOMY (Right) INTERCOSTAL NERVE BLOCK (Right) NODE DISSECTION (Right)  Subjective: Patient had dark, tarry stools 1-2 days ago. He also had an episode of very intense pain around 3 am.  Objective: Vital signs in last 24 hours: Temp:  [97.7 F (36.5 C)-98.4 F (36.9 C)] 97.7 F (36.5 C) (05/02 0353) Pulse Rate:  [68-85] 77 (05/02 0353) Cardiac Rhythm: Normal sinus rhythm (05/02 0353) Resp:  [13-19] 13 (05/02 0353) BP: (98-134)/(59-84) 122/60 (05/02 0353) SpO2:  [98 %-99 %] 99 % (05/02 0353)      Intake/Output from previous day: 05/01 0701 - 05/02 0700 In: 1214.7 [P.O.:1080; I.V.:134.7] Out: 170 [Chest Tube:170]   Physical Exam:  Cardiovascular: RRR Pulmonary: Coarse breath sounds on the right and clear on the left Abdomen: Soft, non tender, bowel sounds present. Extremities: No lower extremity edema. Wounds: Clean and dry.  No erythema or signs of infection. Chest Tube: to water seal, air leak that worsens with cough  Lab Results: [id number]:[id number] Labs    12/28/20 0026  WBC 11.7*  HGB 11.8*  HCT 36.0*  PLT 208   BMET:  Recent Labs    12/28/20 0026  NA 135  K 4.1  CL 102  CO2 26  GLUCOSE 114*  BUN 24*  CREATININE 1.13  CALCIUM 8.7*    PT/INR: No results for input(s): LABPROT, INR in the last 72 hours. ABG:  INR: Will add last result for INR, ABG once components are confirmed Will add last 4 CBG results once components are confirmed  Assessment/Plan:  1. CV - Previous a fib. SR with HR in the 70's. On Amiodarone 200 mg daily, Clonidine 0.1 mg at hs, Plavix 75 mg daily, Ranexa 1000 mg bid, and Toprol Xl 25 mg daily. Per cardiology, will send home with an extended arrhythmia monitor.  2.  Pulmonary - On room air. Chest tube with 170 cc last 24 hours. Chest tube is to water seal  and there is an air leak that worsens with cough.  No CXR ordered for today so will order for am. Will discuss management with Dr. Hendricks Limes mini express or valves . Encourage incentive spirometer 3. Expected post op blood loss anemia-Last H and H stable at 11.8 and 36. 4. GI-occult stool. Re check H and H.  Donielle M ZimmermanPA-C 12/29/2020,7:10 AM [phone]  Agree with above Ongoing leak Continue CT management  Kruz Chiu O Lenzy Kerschner

## 2020-12-29 NOTE — Plan of Care (Signed)
Per Dr. Sallyanne Kuster, patient should be placed on ZIO patch before discharge, order has been placed, once discharge date is determined by primary team,  will need to call EKG tech to place the patch on the day of discharge.

## 2020-12-29 NOTE — TOC Progression Note (Signed)
Transition of Care [facility]) - Progression Note    Patient Details  Name: [name] MRN: [id number] Date of Birth: 03-14-1956  Transition of Care [facility]) CM/SW Contact  Zenon Mayo, RN Phone Number: 12/29/2020, 3:40 PM  Clinical Narrative:    NCM spoke with patient at bedside, he has a persistent air leak .  He may need a HHRN at discharge if goes home with a mini express chest tube.  NCM offered choice , he chose [facility]. NCM made referral to [facility] with [facility] for [facility].  Will keep a look out in case he does not go home with mini express, then he will not want [facility] services.         Expected Discharge Plan and Services                                                 Social Determinants of Health (SDOH) Interventions    Readmission Risk Interventions No flowsheet data found.

## 2020-12-30 ENCOUNTER — Inpatient Hospital Stay (HOSPITAL_COMMUNITY): Payer: Medicare Other

## 2020-12-30 LAB — CBC
HCT: 35.4 % — ABNORMAL LOW (ref 39.0–52.0)
Hemoglobin: 11.6 g/dL — ABNORMAL LOW (ref 13.0–17.0)
MCH: 30.1 pg (ref 26.0–34.0)
MCHC: 32.8 g/dL (ref 30.0–36.0)
MCV: 91.9 fL (ref 80.0–100.0)
Platelets: 236 10*3/uL (ref 150–400)
RBC: 3.85 MIL/uL — ABNORMAL LOW (ref 4.22–5.81)
RDW: 13.1 % (ref 11.5–15.5)
WBC: 10 10*3/uL (ref 4.0–10.5)
nRBC: 0 % (ref 0.0–0.2)

## 2020-12-30 NOTE — Progress Notes (Signed)
   No new episodes of atrial fibrillation.  Would need prolonged monitoring as OP.  Probable chronic anticoagulation.

## 2020-12-30 NOTE — Plan of Care (Signed)

## 2020-12-30 NOTE — Progress Notes (Addendum)
      [address]       [place] [address]             [phone]       6 Days Post-Op Procedure(s) (LRB): XI ROBOTIC ASSISTED THORASCOPY-RIGHT UPPER LOBECTOMY (Right) INTERCOSTAL NERVE BLOCK (Right) NODE DISSECTION (Right)  Subjective: Patient feels bloated this am and has some minor pain at right inguinal crease  Objective: Vital signs in last 24 hours: Temp:  [98 F (36.7 C)-98.3 F (36.8 C)] 98.1 F (36.7 C) (05/03 0401) Pulse Rate:  [74-95] 81 (05/03 0401) Cardiac Rhythm: Normal sinus rhythm (05/03 0431) Resp:  [14-20] 20 (05/03 0401) BP: (110-138)/(62-89) 110/62 (05/03 0401) SpO2:  [97 %-99 %] 99 % (05/03 0401)      Intake/Output from previous day: 05/02 0701 - 05/03 0700 In: -  Out: 300 [Chest Tube:300]   Physical Exam:  Cardiovascular: RRR Pulmonary: Slightly diminished apical breath sounds on the right and clear on the left Abdomen: Soft, non tender, bowel sounds present. Extremities: No lower extremity edema. Wounds: Clean and dry.  No erythema or signs of infection. Chest Tube: to water seal, air leak that worsens with cough  Lab Results: CBC: Recent Labs    12/28/20 0026 12/30/20 0120  WBC 11.7* 10.0  HGB 11.8* 11.6*  HCT 36.0* 35.4*  PLT 208 236   BMET:  Recent Labs    12/28/20 0026  NA 135  K 4.1  CL 102  CO2 26  GLUCOSE 114*  BUN 24*  CREATININE 1.13  CALCIUM 8.7*    PT/INR: No results for input(s): LABPROT, INR in the last 72 hours. ABG:  INR: Will add last result for INR, ABG once components are confirmed Will add last 4 CBG results once components are confirmed  Assessment/Plan:  1. CV - Previous a fib. SR with HR in the 80's this am. On Amiodarone 200 mg daily, Clonidine 0.1 mg at hs, Plavix 75 mg daily, Ranexa 1000 mg bid, and Toprol Xl 25 mg daily. Per cardiology, order placed for ZIO patch and patient will need anticoagulation (as has elevated CHA2DS2-VASc...likely Apixaban. 2.  Pulmonary - On  room air. Chest tube with 300 cc last 24 hours. Chest tube is to water seal and there is an air leak that worsens with cough.  CXR this am appears stable (small right apical pneumothorax). 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 I did not discuss with patient;Dr. Kipp Brood to discuss with him. Will discuss management with Dr. Hendricks Limes mini express or valves . Encourage incentive spirometer 3. Expected post op blood loss anemia- H and H this am stable at 11.6 and 35.4   Donielle M ZimmermanPA-C 12/30/2020,7:04 AM [phone]  Agree with below. Patient has persistent leak.  Patient has agreed for endobronchial valve placement would like to consider performing a day.  Tentatively schedule for 5 to for endobronchial valve placement and exchange of chest tube.  Macy Lingenfelter Bary Leriche

## 2020-12-31 ENCOUNTER — Inpatient Hospital Stay (HOSPITAL_COMMUNITY): Payer: Medicare Other | Admitting: Certified Registered Nurse Anesthetist

## 2020-12-31 ENCOUNTER — Inpatient Hospital Stay (HOSPITAL_COMMUNITY): Payer: Medicare Other

## 2020-12-31 ENCOUNTER — Encounter (HOSPITAL_COMMUNITY)
Admission: RE | Disposition: A | Discharge status: Home / Self Care | Payer: Self-pay | Source: Home / Self Care | Attending: Thoracic Surgery (Cardiothoracic Vascular Surgery)

## 2020-12-31 ENCOUNTER — Encounter (HOSPITAL_COMMUNITY): Payer: Self-pay | Admitting: Thoracic Surgery (Cardiothoracic Vascular Surgery)

## 2020-12-31 DIAGNOSIS — J95812 Postprocedural air leak: Secondary | ICD-10-CM

## 2020-12-31 HISTORY — PX: VIDEO BRONCHOSCOPY WITH INSERTION OF INTERBRONCHIAL VALVE (IBV): SHX6178

## 2020-12-31 HISTORY — PX: 25 GAUGE PARS PLANA VITRECTOMY WITH 20 GAUGE MVR PORT: SHX6041

## 2020-12-31 SURGERY — BRONCHOSCOPY, FLEXIBLE, WITH INTRABRONCHIAL VALVE INSERTION
Anesthesia: General

## 2020-12-31 MED ORDER — CHLORHEXIDINE GLUCONATE 0.12 % MT SOLN: 15.0000 mL | Freq: Once | OROMUCOSAL | Status: DC

## 2020-12-31 MED ORDER — DEXAMETHASONE SODIUM PHOSPHATE 10 MG/ML IJ SOLN
INTRAMUSCULAR | Status: DC | PRN
  Administered 2020-12-31: 4 mg via INTRAVENOUS

## 2020-12-31 MED ORDER — PHENYLEPHRINE 40 MCG/ML (10ML) SYRINGE FOR IV PUSH (FOR BLOOD PRESSURE SUPPORT)
PREFILLED_SYRINGE | INTRAVENOUS | Status: DC | PRN
  Administered 2020-12-31 (×2): 80 ug via INTRAVENOUS

## 2020-12-31 MED ORDER — CHLORHEXIDINE GLUCONATE 0.12 % MT SOLN
15.0000 mL | Freq: Once | OROMUCOSAL | Status: AC
  Administered 2020-12-31: 15 mL via OROMUCOSAL
  Filled 2020-12-31: qty 15

## 2020-12-31 MED ORDER — SUGAMMADEX SODIUM 200 MG/2ML IV SOLN
INTRAVENOUS | Status: DC | PRN
  Administered 2020-12-31: 200 mg via INTRAVENOUS

## 2020-12-31 MED ORDER — LACTATED RINGERS IV SOLN: INTRAVENOUS | Status: DC

## 2020-12-31 MED ORDER — ORAL CARE MOUTH RINSE: 15.0000 mL | Freq: Once | OROMUCOSAL | Status: AC

## 2020-12-31 MED ORDER — FENTANYL CITRATE (PF) 100 MCG/2ML IJ SOLN
INTRAMUSCULAR | Status: AC
  Filled 2020-12-31: qty 2

## 2020-12-31 MED ORDER — PHENYLEPHRINE HCL-NACL 10-0.9 MG/250ML-% IV SOLN
INTRAVENOUS | Status: DC | PRN
  Administered 2020-12-31: 50 ug/min via INTRAVENOUS

## 2020-12-31 MED ORDER — PROPOFOL 10 MG/ML IV BOLUS
INTRAVENOUS | Status: AC
  Filled 2020-12-31: qty 40

## 2020-12-31 MED ORDER — LIDOCAINE 2% (20 MG/ML) 5 ML SYRINGE
INTRAMUSCULAR | Status: DC | PRN
  Administered 2020-12-31: 100 mg via INTRAVENOUS

## 2020-12-31 MED ORDER — FENTANYL CITRATE (PF) 100 MCG/2ML IJ SOLN
25.0000 ug | INTRAMUSCULAR | Status: DC | PRN
  Administered 2020-12-31: 50 ug via INTRAVENOUS

## 2020-12-31 MED ORDER — FENTANYL CITRATE (PF) 250 MCG/5ML IJ SOLN
INTRAMUSCULAR | Status: AC
  Filled 2020-12-31: qty 5

## 2020-12-31 MED ORDER — ORAL CARE MOUTH RINSE: 15.0000 mL | Freq: Once | OROMUCOSAL | Status: DC

## 2020-12-31 MED ORDER — OXYCODONE HCL 5 MG PO TABS
5.0000 mg | ORAL_TABLET | Freq: Once | ORAL | Status: DC | PRN
Start: 2020-12-31 — End: 2020-12-31

## 2020-12-31 MED ORDER — OXYCODONE HCL 5 MG/5ML PO SOLN: 5.0000 mg | Freq: Once | ORAL | Status: DC | PRN

## 2020-12-31 MED ORDER — PROMETHAZINE HCL 25 MG/ML IJ SOLN: 6.2500 mg | INTRAMUSCULAR | Status: DC | PRN

## 2020-12-31 MED ORDER — DEXAMETHASONE SODIUM PHOSPHATE 10 MG/ML IJ SOLN
INTRAMUSCULAR | Status: AC
  Filled 2020-12-31: qty 1

## 2020-12-31 MED ORDER — EPINEPHRINE PF 1 MG/ML IJ SOLN
INTRAMUSCULAR | Status: AC
  Filled 2020-12-31: qty 1

## 2020-12-31 MED ORDER — ONDANSETRON HCL 4 MG/2ML IJ SOLN
INTRAMUSCULAR | Status: DC | PRN
  Administered 2020-12-31: 4 mg via INTRAVENOUS

## 2020-12-31 MED ORDER — PROPOFOL 10 MG/ML IV BOLUS
INTRAVENOUS | Status: DC | PRN
  Administered 2020-12-31: 150 mg via INTRAVENOUS

## 2020-12-31 MED ORDER — FENTANYL CITRATE (PF) 100 MCG/2ML IJ SOLN
INTRAMUSCULAR | Status: DC | PRN
  Administered 2020-12-31: 150 ug via INTRAVENOUS

## 2020-12-31 MED ORDER — ACETAMINOPHEN 10 MG/ML IV SOLN: 1000.0000 mg | Freq: Once | INTRAVENOUS | Status: DC | PRN

## 2020-12-31 MED ORDER — MIDAZOLAM HCL 5 MG/5ML IJ SOLN
INTRAMUSCULAR | Status: DC | PRN
  Administered 2020-12-31: 2 mg via INTRAVENOUS

## 2020-12-31 MED ORDER — MIDAZOLAM HCL 2 MG/2ML IJ SOLN
INTRAMUSCULAR | Status: AC
  Filled 2020-12-31: qty 2

## 2020-12-31 MED ORDER — ONDANSETRON HCL 4 MG/2ML IJ SOLN
INTRAMUSCULAR | Status: AC
  Filled 2020-12-31: qty 2

## 2020-12-31 MED ORDER — ROCURONIUM BROMIDE 10 MG/ML (PF) SYRINGE
PREFILLED_SYRINGE | INTRAVENOUS | Status: DC | PRN
  Administered 2020-12-31: 60 mg via INTRAVENOUS

## 2020-12-31 SURGICAL SUPPLY — 40 items
ADAPTER VALVE BIOPSY EBUS (MISCELLANEOUS) IMPLANT
ADPTR VALVE BIOPSY EBUS (MISCELLANEOUS)
BLADE CLIPPER SURG (BLADE) ×3 IMPLANT
CANISTER SUCT 3000ML PPV (MISCELLANEOUS) ×3 IMPLANT
CATH BALLN 4FR (CATHETERS) ×3 IMPLANT
CATH LOADER DEPLOYMENT HUD (CATHETERS) ×3 IMPLANT
CNTNR URN SCR LID CUP LEK RST (MISCELLANEOUS) ×2 IMPLANT
CONT SPEC 4OZ STRL OR WHT (MISCELLANEOUS) ×3
COVER BACK TABLE 60X90IN (DRAPES) ×3 IMPLANT
FILTER STRAW FLUID ASPIR (MISCELLANEOUS) IMPLANT
FORCEPS BIOP RJ4 1.8 (CUTTING FORCEPS) IMPLANT
FORCEPS RADIAL JAW LRG 4 PULM (INSTRUMENTS) ×2 IMPLANT
GAUZE SPONGE 4X4 12PLY STRL (GAUZE/BANDAGES/DRESSINGS) ×3 IMPLANT
GLOVE BIO SURGEON STRL SZ7 (GLOVE) ×3 IMPLANT
GLOVE BIO SURGEON STRL SZ7.5 (GLOVE) ×3 IMPLANT
GOWN STRL REUS W/ TWL XL LVL3 (GOWN DISPOSABLE) ×2 IMPLANT
GOWN STRL REUS W/TWL XL LVL3 (GOWN DISPOSABLE) ×3
KIT AIRWAY SIZING HUD (KITS) ×3 IMPLANT
KIT CLEAN ENDO COMPLIANCE (KITS) ×3 IMPLANT
KIT TURNOVER KIT B (KITS) ×3 IMPLANT
MARKER SKIN DUAL TIP RULER LAB (MISCELLANEOUS) ×3 IMPLANT
NS IRRIG 1000ML POUR BTL (IV SOLUTION) ×3 IMPLANT
OIL SILICONE PENTAX (PARTS (SERVICE/REPAIRS)) IMPLANT
PAD ARMBOARD 7.5X6 YLW CONV (MISCELLANEOUS) ×6 IMPLANT
RADIAL JAW LRG 4 PULMONARY (INSTRUMENTS) ×1
STOPCOCK MORSE 400PSI 3WAY (MISCELLANEOUS) ×3 IMPLANT
SYR 10ML LL (SYRINGE) ×3 IMPLANT
SYR 20ML ECCENTRIC (SYRINGE) ×3 IMPLANT
TOWEL GREEN STERILE (TOWEL DISPOSABLE) ×3 IMPLANT
TOWEL GREEN STERILE FF (TOWEL DISPOSABLE) ×3 IMPLANT
TOWEL NATURAL 4PK STERILE (DISPOSABLE) ×3 IMPLANT
TRAP SPECIMEN MUCUS 40CC (MISCELLANEOUS) ×3 IMPLANT
TRAY WAYNE PNEUMOTHORAX 14X18 (TRAY / TRAY PROCEDURE) ×3 IMPLANT
TUBE CONNECTING 20X1/4 (TUBING) ×3 IMPLANT
UNDERPAD 30X36 HEAVY ABSORB (UNDERPADS AND DIAPERS) ×3 IMPLANT
VALVE BIOPSY  SINGLE USE (MISCELLANEOUS) ×3
VALVE BIOPSY SINGLE USE (MISCELLANEOUS) ×2 IMPLANT
VALVE IN CARTRIDGE 7MM HUD (Valve) ×3 IMPLANT
VALVE SUCTION BRONCHIO DISP (MISCELLANEOUS) ×3 IMPLANT
WATER STERILE IRR 1000ML POUR (IV SOLUTION) ×3 IMPLANT

## 2020-12-31 NOTE — Anesthesia Preprocedure Evaluation (Addendum)
Anesthesia Evaluation  Patient identified by MRN, date of birth, ID band Patient awake    Reviewed: Allergy & Precautions, NPO status , Patient's Chart, lab work & pertinent test results  Airway Mallampati: II  TM Distance: >3 FB Neck ROM: Full    Dental  (+) Upper Dentures, Lower Dentures   Pulmonary former smoker,  Prolonged air leak s/p RUL lobectomy 12/24/20    + decreased breath sounds      Cardiovascular hypertension, Pt. on medications and Pt. on home beta blockers + CAD, + Past MI (2017), + Cardiac Stents and + CABG (2017)   Rhythm:Regular Rate:Normal     Neuro/Psych  Headaches, Anxiety Depression Bipolar Disorder CVA (2018), Residual Symptoms    GI/Hepatic GERD  ,(+) Hepatitis -, C  Endo/Other  negative endocrine ROS  Renal/GU negative Renal ROS  negative genitourinary   Musculoskeletal  (+) Arthritis , Osteoarthritis,    Abdominal (+)  Abdomen: soft. Bowel sounds: normal.  Peds  Hematology negative hematology ROS (+)   Anesthesia Other Findings   Reproductive/Obstetrics                            Anesthesia Physical Anesthesia Plan  ASA: III  Anesthesia Plan: General   Post-op Pain Management:    Induction: Intravenous  PONV Risk Score and Plan: 2 and Ondansetron, Dexamethasone, Midazolam and Treatment may vary due to age or medical condition  Airway Management Planned: Mask and Oral ETT  Additional Equipment: None  Intra-op Plan:   Post-operative Plan: Extubation in OR  Informed Consent: I have reviewed the patients History and Physical, chart, labs and discussed the procedure including the risks, benefits and alternatives for the proposed anesthesia with the patient or authorized representative who has indicated his/her understanding and acceptance.     Dental advisory given  Plan Discussed with: CRNA  Anesthesia Plan Comments: (Lab Results      Component                 Value               Date                      WBC                      10.0                12/30/2020                HGB                      11.6 (L)            12/30/2020                HCT                      35.4 (L)            12/30/2020                MCV                      91.9                12/30/2020                PLT  236                 12/30/2020           Lab Results      Component                Value               Date                      NA                       135                 12/28/2020                K                        4.1                 12/28/2020                CO2                      26                  12/28/2020                GLUCOSE                  114 (H)             12/28/2020                BUN                      24 (H)              12/28/2020                CREATININE               1.13                12/28/2020                CALCIUM                  8.7 (L)             12/28/2020                GFRNONAA                 >60                 12/28/2020                GFRAA                    >60                 05/27/2020           Per pre-op note Karoline Caldwell PA 12/22/20:  Follows with cardiology for history of CAD s/p anterior STEMI and emergent CABG in 2017.  In February 2019 he had a cath and PCI of the RCA due to narrowing of the graft to the distal RCA.  He has  had multiple episodes of chest pain that been evaluated without intervention.  He had a cath in August 2020 for evaluation of chest pain, medical therapy recommended, no intervention.  He was again seen at the ED 10/24/2020 for chest pain.  Negative troponins.  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  He was last seen by Dr. Irish Lack 11/03/2020 and discussed that he would be seeing pulmonology and would likely need biopsy.  Per note, "Seeing pulmonary/preoperative cardiovascular eval- Dr. Valeta Harms. CT concerning for lung CA.   Family h/o lung CA.   OK to hold Plavix 5 days prior to biopsy."   ECHO 12/27/20: 1. Left ventricular ejection fraction, by estimation, is 55 to 60%. The  left ventricle has normal function. The left ventricle has no regional  wall motion abnormalities. Left ventricular diastolic parameters are  consistent with Grade I diastolic  dysfunction (impaired relaxation).  2. Right ventricular systolic function is normal. The right ventricular  size is normal.  3. The mitral valve is normal in structure. No evidence of mitral valve  regurgitation. No evidence of mitral stenosis.  4. The aortic valve is normal in structure. Aortic valve regurgitation is  not visualized. No aortic stenosis is present.  5. The inferior vena cava is normal in size with greater than 50%  respiratory variability, suggesting right atrial pressure of 3 mmHg.   Comparison(s): No significant change from prior study.   Hx of C7-T1 ACDF.  EKG 12/22/20: Normal sinus rhythm. Rate 81. Possible Left atrial enlargement. Nonspecific ST abnormality. since last tracing no significant change  CHEST - 2 VIEW 12/22/2020: COMPARISON:  12/12/2020 CT.  FINDINGS: Prior median sternotomy.  Cervical spine fixation.  Midline trachea. Normal heart size. Atherosclerosis in the transverse aorta. No pleural effusion or pneumothorax. Right upper lobe 2.2 cm pulmonary nodule again identified. No lobar consolidation or other acute superimposed process.  IMPRESSION: Right upper lobe pulmonary nodule, as before.  Aortic Atherosclerosis (ICD10-I70.0).  CT Chest 12/12/20: IMPRESSION: 1. Right upper lobe lung nodule, felt to be minimally enlarged from 10/24/2020. This remains suspicious for primary bronchogenic carcinoma. 2. No thoracic adenopathy. 3. Aortic atherosclerosis (ICD10-I70.0) and emphysema (ICD10-J43.9).  PFT 11/13/2020: FVC-%Pred-Pre          Latest Units: %            69 FEV1-%Pred-Pre        Latest Units: %             57 FEV1FVC-%Pred-Pre Latest Units: %            82 TLC % pred     Latest Units: %            94 RV % pred       Latest Units: %            147 DLCO unc % pred       Latest Units: %            90   Cath 04/11/19: Mid LAD lesion is 55% stenosed. Ost LAD to Prox LAD lesion is 50% stenosed. LIMA to LAD is patent. Ost Ramus lesion is 90% stenosed. Ramus lesion is 75% stenosed. Ost Cx lesion is 80% stenosed. Prox Cx to Mid Cx lesion is 50% stenosed. SVG to OM is patent. Dist RCA lesion is 50% stenosed. RPDA lesion is 40% stenosed. SVG to PDA is patent with proximal disease that is unchanged. Previously placed Ost RCA to Prox RCA drug eluting  stent is widely patent. Graft to the diagonal was occluded. There was TIMI 3 flow in the small diagonal with proximal ectasia. The left ventricular systolic function is normal. LV end diastolic pressure is normal. LVEDP 11 mm Hg. The left ventricular ejection fraction is 50-55% by visual estimate. There is no aortic valve stenosis.   Continue medical therapy.    TTE 09/28/18: 1. The left ventricle has normal systolic function of 25-74%. The cavity  size is normal. There is normal left ventricular hypertrophy. Echo  evidence of normal diastolic filling patterns.  2. Normal left atrial size.  3. Normal right atrial size.  4. Normal tricuspid valve.  5. Tricuspid regurgitation is mild.  6. No atrial level shunt detected by color flow Doppler.  ) )       Anesthesia Quick Evaluation

## 2020-12-31 NOTE — Anesthesia Postprocedure Evaluation (Signed)
Anesthesia Post Note  Patient: [name]  Procedure(s) Performed: VIDEO BRONCHOSCOPY WITH INSERTION OF INTERBRONCHIAL VALVE (IBV).VALVE IN CARTRIDGE 32mm,9mm. CHEST TUBE PLACEMENT. (N/A ) 25 GAUGE PARS PLANA VITRECTOMY WITH 20 GAUGE MVR PORT     Patient location during evaluation: PACU Anesthesia Type: General Level of consciousness: awake and alert Pain management: pain level controlled Vital Signs Assessment: post-procedure vital signs reviewed and stable Respiratory status: spontaneous breathing, nonlabored ventilation, respiratory function stable and patient connected to nasal cannula oxygen Cardiovascular status: blood pressure returned to baseline and stable Postop Assessment: no apparent nausea or vomiting Anesthetic complications: no   No complications documented.  Last Vitals:  Vitals:   12/31/20 1605 12/31/20 1630  BP: 130/75 128/70  Pulse: 78 83  Resp: 16 16  Temp: 36.8 C   SpO2: 100% 95%    Last Pain:  Vitals:   12/31/20 1605  TempSrc:   PainSc: 4                  Jaece Ducharme P Jesselyn Rask

## 2020-12-31 NOTE — Plan of Care (Signed)
  Problem: Coping: Goal: Level of anxiety will decrease 12/31/2020 2304 by Brand Males, RN Outcome: Not Progressing 12/31/2020 2303 by Brand Males, RN Outcome: Not Progressing 12/31/2020 2040 by Brand Males, RN Outcome: Progressing   Problem: Pain Managment: Goal: General experience of comfort will improve 12/31/2020 2304 by Brand Males, RN Outcome: Not Progressing 12/31/2020 2303 by Brand Males, RN Outcome: Not Progressing 12/31/2020 2040 by Brand Males, RN Outcome: Not Progressing

## 2020-12-31 NOTE — Progress Notes (Signed)
   Maintaining NSR.  OP monitor to determine need for anticoagulation.

## 2020-12-31 NOTE — TOC Progression Note (Signed)
Transition of Care [facility]) - Progression Note    Patient Details  Name: Labaron Digirolamo MRN: [id number] Date of Birth: 01/23/1956  Transition of [facility] CM/SW Contact  Zenon Mayo, RN Phone Number: 12/31/2020, 10:01 PM  Clinical Narrative:    Patient is s/p bronchial valves today, canceled the referral with Stacie with [facility] for [facility].         Expected Discharge Plan and Services                                                 Social Determinants of Health (SDOH) Interventions    Readmission Risk Interventions No flowsheet data found.

## 2020-12-31 NOTE — Op Note (Signed)
      [address]       [place] [address]             [phone]        12/31/2020  Patient:  [name] Pre-Op Dx: Prolonged air leak following right upper lobectomy Post-op Dx: Same Procedure: - Bronchoscopy - 1mm valve placement in the superior segment of the lower lobe bronchus - 71mm valve placement in the basilar segment of the lower lobe - Pigtail catheter placement.  Surgeon and Role:      * Francene Mcerlean, Lucile Crater, MD - Primary  Anesthesia  general EBL: 0 ml Blood Administration: None   Indications: 65 year old male is postoperative day 7 following robotic assisted right upper lobectomy has had a prolonged air leak, this we will consider placing  Findings: At the start of the procedure there was no obvious air leak.  Valsalva breaths to elicit the air leak.  There appeared to be a decreased air leak after placement of both valves.  Operative Technique: All invasive lines were placed in pre-op holding.  After the risks, benefits and alternatives were thoroughly discussed, the patient was brought to the operative theatre.  Anesthesia was induced, and the patient was prepped and draped in normal sterile fashion.  An appropriate surgical pause was performed.  The chest tube was then placed on suction.  There was an air leak present with Valsalva breaths..  Flexible fiberoptic bronchoscopy was performed via the endotracheal tube.  It revealed normal endobronchial anatomy following a right upper lobectomy with no endobronchial lesions to the level of the subsegmental bronchi.  A Fogarty balloon catheter then was placed through the working port of the bronchoscope.  Occlusion of the right lower lobe superior segment bronchus resulted in decreased of the air leak.  Occlusion of the individual smaller segmental bronchi in the basilar segments resulted in small appreciable change in the air leak.  The sizing balloon was placed and used to select appropriate  valve sizes.  The bronchoscope then was directed to the superior segment bronchi.  This measured for a 7 mm valve, which was deployed.     9 mm valve was used to occlude the basilar segments.  After deploying the valves and waiting several minutes, the air leak decreased.  Final inspection was made with the bronchoscope and all valves appeared well seated.  There was no significant bleeding.  The bronchoscope was removed.    28 French chest tube was placed using Seldinger technique, and 28 French chest tube was then the patient was extubated in the operating room and taken to the [facility] [facility] in good condition.    The patient tolerated the procedure without any immediate complications, and was transferred to the PACU in good condition.  Hodaya Curto Bary Leriche

## 2020-12-31 NOTE — Progress Notes (Signed)
   12/31/20 1200  Clinical Encounter Type  Visited With Patient  Visit Type Initial  Referral From Nurse  Consult/Referral To Chaplain  The chaplain ensured the patient had a copy of an Advanced Directive. If the patient has additional questions. The chaplain will follow up as needed.

## 2020-12-31 NOTE — Anesthesia Procedure Notes (Signed)
Procedure Name: Intubation Date/Time: 12/31/2020 2:39 PM Performed by: Moshe Salisbury, CRNA Pre-anesthesia Checklist: Patient identified, Emergency Drugs available, Suction available and Patient being monitored Patient Re-evaluated:Patient Re-evaluated prior to induction Oxygen Delivery Method: Circle System Utilized Preoxygenation: Pre-oxygenation with 100% oxygen Induction Type: IV induction Ventilation: Mask ventilation without difficulty Laryngoscope Size: Mac and 4 Grade View: Grade I Tube type: Oral Tube size: 9.0 mm Number of attempts: 1 Airway Equipment and Method: Stylet Placement Confirmation: ETT inserted through vocal cords under direct vision,  positive ETCO2 and breath sounds checked- equal and bilateral Secured at: 20 cm Tube secured with: Tape Dental Injury: Teeth and Oropharynx as per pre-operative assessment

## 2020-12-31 NOTE — Progress Notes (Signed)
     [address]       [place] [address]             [phone]       No events  Vitals:   12/31/20 0451 12/31/20 0715  BP: 110/73 135/89  Pulse: 75 89  Resp: 14 19  Temp: 98.3 F (36.8 C) 98.9 F (37.2 C)  SpO2: 94%    Alert NAD Sinus Persistent leak with cough EWOB  65 yo male s/p RULectomy with persistent air leak OR today for endobronchial valve placement, and exchange of chest tube Remains in sinus.  Will hold off on anticoagulation for now.  Zahir Eisenhour Bary Leriche

## 2020-12-31 NOTE — Progress Notes (Signed)
RN was told in report from off going nurse that per PACU pt was to be on intermittent suction post surgery. Orders from 4/27 for pt to be on water seal. On call CVTS MD Dr. Roxan Hockey paged and made aware. MD stated to follow order and have pt on water seal. Moderate to large air leak still present. Will continue to monitor.

## 2020-12-31 NOTE — Plan of Care (Signed)

## 2020-12-31 NOTE — Transfer of Care (Signed)
Immediate Anesthesia Transfer of Care Note  Patient: [name]  Procedure(s) Performed: VIDEO BRONCHOSCOPY WITH INSERTION OF INTERBRONCHIAL VALVE (IBV).VALVE IN CARTRIDGE 86mm,9mm. CHEST TUBE PLACEMENT. (N/A ) 25 GAUGE PARS PLANA VITRECTOMY WITH 20 GAUGE MVR PORT  Patient Location: PACU  Anesthesia Type:General  Level of Consciousness: awake and patient cooperative  Airway & Oxygen Therapy: Patient Spontanous Breathing and Patient connected to nasal cannula oxygen  Post-op Assessment: Report given to RN and Post -op Vital signs reviewed and stable  Post vital signs: Reviewed and stable  Last Vitals:  Vitals Value Taken Time  BP 134/80 12/31/20 1535  Temp    Pulse 88 12/31/20 1537  Resp 15 12/31/20 1537  SpO2 100 % 12/31/20 1537  Vitals shown include unvalidated device data.  Last Pain:  Vitals:   12/31/20 1224  TempSrc: Oral  PainSc:       Patients Stated Pain Goal: 4 (46/21/94 7125)  Complications: No complications documented.

## 2021-01-01 ENCOUNTER — Inpatient Hospital Stay (HOSPITAL_COMMUNITY): Payer: Medicare Other

## 2021-01-01 ENCOUNTER — Encounter: Payer: Self-pay | Admitting: *Deleted

## 2021-01-01 ENCOUNTER — Encounter (HOSPITAL_COMMUNITY): Payer: Self-pay | Admitting: Thoracic Surgery (Cardiothoracic Vascular Surgery)

## 2021-01-01 ENCOUNTER — Other Ambulatory Visit: Payer: Self-pay | Admitting: *Deleted

## 2021-01-01 MED ORDER — LEVALBUTEROL HCL 0.63 MG/3ML IN NEBU
0.6300 mg | INHALATION_SOLUTION | Freq: Four times a day (QID) | RESPIRATORY_TRACT | Status: DC | PRN
  Administered 2021-01-01 – 2021-01-07 (×4): 0.63 mg via RESPIRATORY_TRACT
  Filled 2021-01-01 (×4): qty 3

## 2021-01-01 NOTE — Progress Notes (Signed)
   Stable this morning.  Had terrible pain after the procedure yesterday.  Does not want to be discharged today until he is sure pain control is sufficient.  Will make plans to have a Zio patch placed to monitor him for atrial fibrillation.  Continue OP Amio for 2 weeks then stop.

## 2021-01-01 NOTE — Progress Notes (Signed)
Chest tube unclamped and on water seal per Jadene Pierini, [place]. Will continue to monitor.

## 2021-01-01 NOTE — Progress Notes (Signed)
The proposed treatment discussed in cancer conference is for discussion purpose only and is not a binding recommendation. The patient was not physically examined nor present for their treatment options. Therefore, final treatment plans cannot be decided.  ?

## 2021-01-01 NOTE — Progress Notes (Addendum)
[address]       [place] [address]             [phone]      1 Day Post-Op Procedure(s) (LRB): VIDEO BRONCHOSCOPY WITH INSERTION OF INTERBRONCHIAL VALVE (IBV).VALVE IN CARTRIDGE 88mm,9mm. CHEST TUBE PLACEMENT. (N/A) 25 GAUGE PARS PLANA VITRECTOMY WITH 20 GAUGE MVR PORT Subjective: Mod pain last night, improved this am  Objective: Vital signs in last 24 hours: Temp:  [97.6 F (36.4 C)-98.3 F (36.8 C)] 98.1 F (36.7 C) (05/05 0719) Pulse Rate:  [75-90] 81 (05/05 0719) Cardiac Rhythm: Normal sinus rhythm (05/05 0719) Resp:  [15-20] 15 (05/05 0719) BP: (97-134)/(65-80) 118/71 (05/05 0719) SpO2:  [92 %-100 %] 97 % (05/05 0719)  Hemodynamic parameters for last 24 hours:    Intake/Output from previous day: 05/04 0701 - 05/05 0700 In: 490 [P.O.:240; I.V.:250] Out: 1234 [Urine:1100; Chest Tube:134] Intake/Output this shift: No intake/output data recorded.  General appearance: alert, cooperative and no distress Heart: regular rate and rhythm Lungs: coarse on right Abdomen: benign Extremities: no edema, PAS in place Wound: incis healing well  Lab Results: Recent Labs    12/30/20 0120  WBC 10.0  HGB 11.6*  HCT 35.4*  PLT 236   BMET: No results for input(s): NA, K, CL, CO2, GLUCOSE, BUN, CREATININE, CALCIUM in the last 72 hours.  PT/INR: No results for input(s): LABPROT, INR in the last 72 hours. ABG    Component Value Date/Time   PHART 7.403 12/22/2020 1408   HCO3 21.3 12/22/2020 1408   TCO2 29 06/23/2018 1038   ACIDBASEDEF 2.7 (H) 12/22/2020 1408   O2SAT 97.0 12/22/2020 1408   CBG (last 3)  No results for input(s): GLUCAP in the last 72 hours.  Meds Scheduled Meds: . amiodarone  200 mg Oral Daily  . amLODipine  10 mg Oral Daily  . aspirin EC  81 mg Oral Daily  . atorvastatin  80 mg Oral Daily  . bisacodyl  10 mg Oral Daily  . cloNIDine  0.1 mg Oral QHS  . clopidogrel  75 mg Oral Daily  . metoprolol succinate  25 mg Oral  Daily  . ranolazine  1,000 mg Oral BID  . senna-docusate  1 tablet Oral QHS   Continuous Infusions: . sodium chloride     PRN Meds:.Place/Maintain arterial line **AND** sodium chloride, alum & mag hydroxide-simeth, chlordiazePOXIDE, fluticasone, HYDROmorphone (DILAUDID) injection, loperamide, magnesium hydroxide, ondansetron (ZOFRAN) IV, oxyCODONE-acetaminophen, traMADol  Xrays DG CHEST PORT 1 VIEW  Result Date: 12/31/2020 CLINICAL DATA:  Status post lobectomy EXAM: PORTABLE CHEST 1 VIEW COMPARISON:  12/31/2020, 12/30/2020, 12/28/2020, CT 12/12/2020 FINDINGS: Post sternotomy changes. New right-sided chest drainage catheter with pigtail at the right apex. Small residual right apical pneumothorax, probably grossly unchanged allowing for non inclusion of the extreme apices. Postsurgical changes at the right hilus with interval radiopaque devices at the right hilus. Stable cardiomediastinal silhouette IMPRESSION: 1. Status post right upper lobectomy with placement of new right upper chest drainage catheter. Small residual right apical pneumothorax, probably not significantly changed. Electronically Signed   By: Donavan Foil M.D.   On: 12/31/2020 18:47   DG CHEST PORT 1 VIEW  Result Date: 12/31/2020 CLINICAL DATA:  Chest tube.  Pneumothorax. EXAM: PORTABLE CHEST 1 VIEW COMPARISON:  12/30/2020. FINDINGS: Right chest tube in stable position. Stable right apical pneumothorax. Prior CABG. Heart size normal. No focal infiltrate. Mild right base subsegmental atelectasis again noted. Prior cervical spine fusion. IMPRESSION: 1. Right chest  tube in stable position. Stable right apical pneumothorax. 2.  Prior CABG.  Heart size normal. 3.  Mild right base subsegmental atelectasis again noted. Electronically Signed   By: Marcello Moores  Register   On: 12/31/2020 06:55    Assessment/Plan: S/P Procedure(s) (LRB): VIDEO BRONCHOSCOPY WITH INSERTION OF INTERBRONCHIAL VALVE (IBV).VALVE IN CARTRIDGE 75mm,9mm. CHEST TUBE  PLACEMENT. (N/A) 25 GAUGE PARS PLANA VITRECTOMY WITH 20 GAUGE MVR PORT   1 afeb, VSS,  Maintaining sinus rhythm 2 sats good on RA 3 no CXR ordered for today- will obtain 4 EBV's placed yesterday, CT with mod air leak, somewhat variable 5 no new labs 5 cont pulm toilet and rehab modalities, pain control     LOS: 8 days    John Giovanni PA-C Pager [phone] 01/01/2021   Agree with above. Clamping trial of his chest. If stable will remove chest tube  Kirsta Probert O Crandall [place]

## 2021-01-01 NOTE — Plan of Care (Signed)
  Problem: Education: Goal: Knowledge of General Education information will improve Description: Including pain rating scale, medication(s)/side effects and non-pharmacologic comfort measures Outcome: Progressing   Problem: Health Behavior/Discharge Planning: Goal: Ability to manage health-related needs will improve Outcome: Progressing   Problem: Clinical Measurements: Goal: Ability to maintain clinical measurements within normal limits will improve Outcome: Progressing Goal: Respiratory complications will improve Outcome: Progressing   Problem: Activity: Goal: Risk for activity intolerance will decrease Outcome: Progressing   Problem: Nutrition: Goal: Adequate nutrition will be maintained Outcome: Progressing   Problem: Pain Managment: Goal: General experience of comfort will improve Outcome: Progressing   Problem: Safety: Goal: Ability to remain free from injury will improve Outcome: Progressing   Problem: Skin Integrity: Goal: Risk for impaired skin integrity will decrease Outcome: Progressing   

## 2021-01-02 ENCOUNTER — Other Ambulatory Visit: Payer: Self-pay

## 2021-01-02 ENCOUNTER — Inpatient Hospital Stay (HOSPITAL_COMMUNITY): Payer: Medicare Other

## 2021-01-02 LAB — SURGICAL PATHOLOGY

## 2021-01-02 NOTE — Plan of Care (Signed)
  Problem: Education: Goal: Knowledge of General Education information will improve Description Including pain rating scale, medication(s)/side effects and non-pharmacologic comfort measures Outcome: Progressing   

## 2021-01-02 NOTE — Discharge Instructions (Signed)
Robot-Assisted Thoracic Surgery, After Care    The following information offers guidance on how to care for yourself after your procedure. Your health care provider may also give you more specific instructions. If you have problems or questions, contact your health care provider. What can I expect after the procedure? After the procedure, it is common to have:  Some pain and aches in the area of your surgical incisions.  Pain when breathing in (inhaling) and coughing.  Tiredness (fatigue).  Trouble sleeping.  Constipation. Follow these instructions at home: Medicines  Take over-the-counter and prescription medicines only as told by your health care provider.  If you were prescribed an antibiotic medicine, take it as told by your health care provider. Do not stop taking the antibiotic even if you start to feel better.  Talk with your health care provider about safe and effective ways to manage pain after your procedure. Pain management should fit your specific health needs.  Take pain medicine before pain becomes severe. Relieving and controlling your pain will make breathing easier for you.  Ask your health care provider if the medicine prescribed to you requires you to avoid driving or using machinery. Eating and drinking Follow instructions from your health care provider about eating or drinking restrictions. These will vary depending on what procedure you had. Your health care provider may recommend:  A liquid diet or soft diet for the first few days.  Meals that are smaller and more frequent.  A diet of fruits, vegetables, whole grains, and low-fat proteins.  Limiting foods that are high in fat and processed sugar, including fried or sweet foods. Incision care  Follow instructions from your health care provider about how to take care of your incisions. Make sure you: ? Wash your hands with soap and water for at least 20 seconds before and after you change your bandage  (dressing). If soap and water are not available, use hand sanitizer. ? Change your dressing as told by your health care provider. ? Leave stitches (sutures), skin glue, or adhesive strips in place. These skin closures may need to stay in place for 2 weeks or longer. If adhesive strip edges start to loosen and curl up, you may trim the loose edges. Do not remove adhesive strips completely unless your health care provider tells you to do that.  Check your incision area every day for signs of infection. Check for: ? Redness, swelling, or more pain. ? Fluid or blood. ? Warmth. ? Pus or a bad smell. Activity  Return to your normal activities as told by your health care provider. Ask your health care provider what activities are safe for you.  Ask your health care provider when it is safe for you to drive.  Do not lift anything that is heavier than 10 lb (4.5 kg), or the limit that you are told, until your health care provider says that it is safe.  Rest as told by your health care provider.  Avoid sitting for a long time without moving. Get up to take short walks every 1-2 hours. This is important to improve blood flow and breathing. Ask for help if you feel weak or unsteady.  Do exercises as told by your health care provider. Pneumonia prevention  Do deep breathing exercises and cough regularly as directed. This helps clear mucus and opens your lungs. Doing this helps prevent lung infection (pneumonia).  If you were given an incentive spirometer, use it as told. An incentive spirometer is a tool  that measures how well you are filling your lungs with each breath.  Coughing may hurt less if you try to support your chest. This is called splinting. Try one of these when you cough: ? Hold a pillow against your chest. ? Place the palms of both hands on top of your incision area.  Do not use any products that contain nicotine or tobacco. These products include cigarettes, chewing tobacco, and  vaping devices, such as e-cigarettes. If you need help quitting, ask your health care provider.  Avoid secondhand smoke.   General instructions  If you have a drainage tube: ? Follow instructions from your health care provider about how to take care of it. ? Do not travel by airplane after your tube is removed until your health care provider tells you it is safe.  You may need to take these actions to prevent or treat constipation: ? Drink enough fluid to keep your urine pale yellow. ? Take over-the-counter or prescription medicines. ? Eat foods that are high in fiber, such as beans, whole grains, and fresh fruits and vegetables. ? Limit foods that are high in fat and processed sugars, such as fried or sweet foods.  Keep all follow-up visits. This is important. Contact a health care provider if:  You have redness, swelling, or more pain around an incision.  You have fluid or blood coming from an incision.  An incision feels warm to the touch.  You have pus or a bad smell coming from an incision.  You have a fever.  You cannot eat or drink without vomiting.  Your pain medicine is not controlling your pain. Get help right away if:  You have chest pain.  Your heart is beating quickly.  You have trouble breathing.  You have trouble speaking.  You are confused.  You feel weak or dizzy, or you faint. These symptoms may represent a serious problem that is an emergency. Do not wait to see if the symptoms will go away. Get medical help right away. Call your local emergency services (911 in the [place] Do not drive yourself to the hospital. Summary  Talk with your health care provider about safe and effective ways to manage pain after your procedure. Pain management should fit your specific health needs.  Return to your normal activities as told by your health care provider. Ask your health care provider what activities are safe for you.  Do deep breathing exercises and cough  regularly as directed. This helps to clear mucus and prevent pneumonia. If it hurts to cough, ease pain by holding a pillow against your chest or by placing the palms of both hands over your incisions. This information is not intended to replace advice given to you by your health care provider. Make sure you discuss any questions you have with your health care provider. Document Revised: 05/09/2020 Document Reviewed: 05/09/2020 Elsevier Patient Education  2021 [employer].

## 2021-01-02 NOTE — Progress Notes (Addendum)
Rhythm is stable.  Needs Zio patch at discharge.  Amiodarone DC'd at some point after discharge while still wearing the Zio patch.  Marland Kitchen

## 2021-01-02 NOTE — Progress Notes (Signed)
      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 CHEST TUBE PLACEMENT. (N/A) 25 GAUGE PARS PLANA VITRECTOMY WITH 20 GAUGE MVR PORT Subjective: Feels okay, frustrated  Objective: Vital signs in last 24 hours: Temp:  [97.9 F (36.6 C)-98.2 F (36.8 C)] 98.2 F (36.8 C) (05/06 0300) Pulse Rate:  [72-88] 72 (05/06 0300) Cardiac Rhythm: Normal sinus rhythm (05/05 1900) Resp:  [13-20] 13 (05/06 0300) BP: (101-118)/(58-77) 112/72 (05/06 0300) SpO2:  [90 %-98 %] 98 % (05/06 0300)     Intake/Output from previous day: 05/05 0701 - 05/06 0700 In: 1200 [P.O.:1200] Out: 750 [Urine:750] Intake/Output this shift: No intake/output data recorded.  General appearance: alert, cooperative and no distress Heart: regular rate and rhythm, S1, S2 normal, no murmur, click, rub or gallop Lungs: clear to auscultation bilaterally Abdomen: soft, non-tender; bowel sounds normal; no masses,  no organomegaly Extremities: extremities normal, atraumatic, no cyanosis or edema Wound: clean and dry  Lab Results: No results for input(s): WBC, HGB, HCT, PLT in the last 72 hours. BMET: No results for input(s): NA, K, CL, CO2, GLUCOSE, BUN, CREATININE, CALCIUM in the last 72 hours.  PT/INR: No results for input(s): LABPROT, INR in the last 72 hours. ABG    Component Value Date/Time   PHART 7.403 12/22/2020 1408   HCO3 21.3 12/22/2020 1408   TCO2 29 06/23/2018 1038   ACIDBASEDEF 2.7 (H) 12/22/2020 1408   O2SAT 97.0 12/22/2020 1408   CBG (last 3)  No results for input(s): GLUCAP in the last 72 hours.  Assessment/Plan: S/P Procedure(s) (LRB): VIDEO BRONCHOSCOPY WITH INSERTION OF INTERBRONCHIAL VALVE (IBV).VALVE IN CARTRIDGE 67mm,9mm. CHEST TUBE PLACEMENT. (N/A) 25 GAUGE PARS PLANA VITRECTOMY WITH 20 GAUGE MVR PORT  1 afeb, VSS,   Maintaining sinus rhythm 2 sats good on RA 3 CXR stable this morning, didn't tolerate clamping chest tube yesterday. 4 EBV's placed 5/4, CT with mod air leak, somewhat variable 5 no new labs 5 cont pulm toilet and rehab modalities, pain control  Plan: Still with huge air leak and failed clamping yesterday. May need a mini express, plan per Dr. Kipp Brood   LOS: 9 days    Elgie Collard 01/02/2021

## 2021-01-03 ENCOUNTER — Inpatient Hospital Stay (HOSPITAL_COMMUNITY): Payer: Medicare Other

## 2021-01-03 NOTE — Progress Notes (Addendum)
      [address]       [place] [address]             [phone]      3 Days Post-Op Procedure(s) (LRB): VIDEO BRONCHOSCOPY WITH INSERTION OF INTERBRONCHIAL VALVE (IBV).VALVE IN CARTRIDGE 52mm,9mm. CHEST TUBE PLACEMENT. (N/A) 25 GAUGE PARS PLANA VITRECTOMY WITH 20 GAUGE MVR PORT   Subjective:  Patient up walking the hallways this morning.  Had an episode of sharp chest pain overnight, that didn't relieve with medications.  I explained this is likely neuropathic in nature.  Multiple questions addressed.  Objective: Vital signs in last 24 hours: Temp:  [97.7 F (36.5 C)-98.3 F (36.8 C)] 97.8 F (36.6 C) (05/07 0802) Pulse Rate:  [73-97] 89 (05/07 0802) Cardiac Rhythm: Normal sinus rhythm (05/07 0802) Resp:  [14-19] 17 (05/07 0802) BP: (103-129)/(66-86) 129/73 (05/07 0802) SpO2:  [90 %-98 %] 96 % (05/07 0802)  Intake/Output from previous day: 05/06 0701 - 05/07 0700 In: 390 [P.O.:390] Out: 322 [Chest Tube:322] Intake/Output this shift: Total I/O In: 240 [P.O.:240] Out: -   General appearance: alert, cooperative and no distress Heart: regular rate and rhythm Lungs: clear to auscultation bilaterally Abdomen: soft, non-tender; bowel sounds normal; no masses,  no organomegaly Extremities: extremities normal, atraumatic, no cyanosis or edema Wound: clean and dry  Lab Results: No results for input(s): WBC, HGB, HCT, PLT in the last 72 hours. BMET: No results for input(s): NA, K, CL, CO2, GLUCOSE, BUN, CREATININE, CALCIUM in the last 72 hours.  PT/INR: No results for input(s): LABPROT, INR in the last 72 hours. ABG    Component Value Date/Time   PHART 7.403 12/22/2020 1408   HCO3 21.3 12/22/2020 1408   TCO2 29 06/23/2018 1038   ACIDBASEDEF 2.7 (H) 12/22/2020 1408   O2SAT 97.0 12/22/2020 1408   CBG (last 3)  No results for input(s): GLUCAP in the last 72 hours.  Assessment/Plan: S/P Procedure(s) (LRB): VIDEO BRONCHOSCOPY WITH INSERTION OF  INTERBRONCHIAL VALVE (IBV).VALVE IN CARTRIDGE 12mm,9mm. CHEST TUBE PLACEMENT. (N/A) 25 GAUGE PARS PLANA VITRECTOMY WITH 20 GAUGE MVR PORT  1. CV- hemodynamically stable in NSR 2. Pulm- large air leak persists, despite IBV placement, unfortunately failed clamping trial yesterday, no CXR ordered this morning, will obtain 3. Dispo- patient stable, ambulating w/o issues, in NSR, large air leak, failed clamping trial yesterday, CXR ordered   LOS: 10 days    Ellwood Handler, PA-C 01/03/2021   I have seen and examined the patient and agree with the assessment and plan as outlined.  CXR w/ slightly increased right PTX.  Keep tube on suction  Rexene Alberts, MD 01/03/2021 10:43 AM

## 2021-01-04 ENCOUNTER — Inpatient Hospital Stay (HOSPITAL_COMMUNITY): Payer: Medicare Other

## 2021-01-04 MED ORDER — KETOROLAC TROMETHAMINE 30 MG/ML IJ SOLN
30.0000 mg | Freq: Once | INTRAMUSCULAR | Status: AC
  Administered 2021-01-04: 30 mg via INTRAVENOUS
  Filled 2021-01-04: qty 1

## 2021-01-04 MED ORDER — IBUPROFEN 200 MG PO TABS: 400.0000 mg | ORAL_TABLET | Freq: Three times a day (TID) | ORAL | Status: DC | PRN

## 2021-01-04 NOTE — Progress Notes (Addendum)
      [address]       [place] [address]             [phone]      4 Days Post-Op Procedure(s) (LRB): VIDEO BRONCHOSCOPY WITH INSERTION OF INTERBRONCHIAL VALVE (IBV).VALVE IN CARTRIDGE 61mm,9mm. CHEST TUBE PLACEMENT. (N/A) 25 GAUGE PARS PLANA VITRECTOMY WITH 20 GAUGE MVR PORT   Subjective:  Patient states he had a rough night.  He states he had pain overnight that awoke him from sleep.  He is trying to not use IV pain medication, but he doesn't think he could get through the night without it.  + ambulation  + IS  Objective: Vital signs in last 24 hours: Temp:  [97.6 F (36.4 C)-98.4 F (36.9 C)] 97.6 F (36.4 C) (05/08 0300) Pulse Rate:  [75-95] 92 (05/08 0758) Cardiac Rhythm: Normal sinus rhythm (05/08 0800) Resp:  [12-24] 14 (05/08 0758) BP: (105-128)/(66-78) 105/68 (05/08 0758) SpO2:  [93 %-98 %] 94 % (05/08 0758)  Intake/Output from previous day: 05/07 0701 - 05/08 0700 In: 1050 [P.O.:1040] Out: 220 [Chest Tube:220] Intake/Output this shift: Total I/O In: 240 [P.O.:240] Out: 30 [Chest Tube:30]  General appearance: alert, cooperative and no distress Heart: regular rate and rhythm Lungs: clear to auscultation bilaterally Abdomen: soft, non-tender; bowel sounds normal; no masses,  no organomegaly Extremities: extremities normal, atraumatic, no cyanosis or edema Wound: clean and dry  Lab Results: No results for input(s): WBC, HGB, HCT, PLT in the last 72 hours. BMET: No results for input(s): NA, K, CL, CO2, GLUCOSE, BUN, CREATININE, CALCIUM in the last 72 hours.  PT/INR: No results for input(s): LABPROT, INR in the last 72 hours. ABG    Component Value Date/Time   PHART 7.403 12/22/2020 1408   HCO3 21.3 12/22/2020 1408   TCO2 29 06/23/2018 1038   ACIDBASEDEF 2.7 (H) 12/22/2020 1408   O2SAT 97.0 12/22/2020 1408   CBG (last 3)  No results for input(s): GLUCAP in the last 72 hours.  Assessment/Plan: S/P Procedure(s) (LRB): VIDEO  BRONCHOSCOPY WITH INSERTION OF INTERBRONCHIAL VALVE (IBV).VALVE IN CARTRIDGE 49mm,9mm. CHEST TUBE PLACEMENT. (N/A) 25 GAUGE PARS PLANA VITRECTOMY WITH 20 GAUGE MVR PORT  1. CV- NSR, BP stable- on Amiodarone, Norvasc, Catapres, Toprol XL, Ranexa 2. Pulm- CT on suction, air leak improving, CXR with improvement of pneumothorax, good use of IS... will keep chest tube to suction today, repeat CXR 3. Dispo- patient stable, continues to have pain at night, I encouraged patient to leave chest tube on suction as much as possible today, he ambulates in the hallway on water seal and notices increased pain when he plugs the box back in, he is hesitant to go home with a chest tube in place due to pain, continue current care, repeat CXR in AM   LOS: 11 days    Ellwood Handler, PA-C  01/04/2021   I have seen and examined the patient and agree with the assessment and plan as outlined.  Rexene Alberts, MD 01/04/2021 10:26 AM

## 2021-01-05 ENCOUNTER — Inpatient Hospital Stay (HOSPITAL_COMMUNITY): Payer: Medicare Other

## 2021-01-05 NOTE — Plan of Care (Signed)

## 2021-01-05 NOTE — Progress Notes (Addendum)
      [address]       [place] [address]             [phone]      5 Days Post-Op Procedure(s) (LRB): VIDEO BRONCHOSCOPY WITH INSERTION OF INTERBRONCHIAL VALVE (IBV).VALVE IN CARTRIDGE 66mm,9mm. CHEST TUBE PLACEMENT. (N/A) 25 GAUGE PARS PLANA VITRECTOMY WITH 20 GAUGE MVR PORT Subjective: Feels okay this morning, pain is worse at night.  Objective: Vital signs in last 24 hours: Temp:  [97.4 F (36.3 C)-98.4 F (36.9 C)] 97.4 F (36.3 C) (05/09 0443) Pulse Rate:  [75-92] 75 (05/09 0443) Cardiac Rhythm: Sinus tachycardia (05/09 0711) Resp:  [12-23] 15 (05/09 0443) BP: (105-119)/(68-84) 119/68 (05/09 0443) SpO2:  [94 %-98 %] 95 % (05/09 0443)     Intake/Output from previous day: 05/08 0701 - 05/09 0700 In: 680 [P.O.:680] Out: 230 [Chest Tube:230] Intake/Output this shift: No intake/output data recorded.  General appearance: alert, cooperative and no distress Heart: regular rate and rhythm, S1, S2 normal, no murmur, click, rub or gallop Lungs: clear to auscultation bilaterally Abdomen: soft, non-tender; bowel sounds normal; no masses,  no organomegaly Extremities: extremities normal, atraumatic, no cyanosis or edema Wound: clean and dry  Lab Results: No results for input(s): WBC, HGB, HCT, PLT in the last 72 hours. BMET: No results for input(s): NA, K, CL, CO2, GLUCOSE, BUN, CREATININE, CALCIUM in the last 72 hours.  PT/INR: No results for input(s): LABPROT, INR in the last 72 hours. ABG    Component Value Date/Time   PHART 7.403 12/22/2020 1408   HCO3 21.3 12/22/2020 1408   TCO2 29 06/23/2018 1038   ACIDBASEDEF 2.7 (H) 12/22/2020 1408   O2SAT 97.0 12/22/2020 1408   CBG (last 3)  No results for input(s): GLUCAP in the last 72 hours.  Assessment/Plan: S/P Procedure(s) (LRB): VIDEO BRONCHOSCOPY WITH INSERTION OF INTERBRONCHIAL VALVE (IBV).VALVE IN CARTRIDGE 43mm,9mm. CHEST TUBE PLACEMENT. (N/A) 25 GAUGE PARS PLANA VITRECTOMY WITH 20 GAUGE MVR  PORT  1. CV- NSR, BP stable- on Amiodarone, Norvasc, Catapres, Toprol XL, Ranexa 2. Pulm- CT on suction, air leak improving, CXR with small pneumo unchanged from the previous day, good use of IS... will plan to possibly switch to water seal and monitor closely.  Plan: CXR in the AM, possible change to [facility] today. Encouraged ambulation in the halls and use of incentive spirometer. Having increased pain at night and still requiring IV pain medication.       LOS: 12 days    Elgie Collard 01/05/2021 Airleak much improved. Will place chest tube to waterseal. Clamping trial tomorrow.  Earnesteen Birnie Bary Leriche

## 2021-01-06 ENCOUNTER — Inpatient Hospital Stay (HOSPITAL_COMMUNITY): Payer: Medicare Other

## 2021-01-06 NOTE — Progress Notes (Signed)
      [address]       [place] [address]             [phone]       Radiology report reviewed:  CLINICAL DATA:  Chest tube clamped  EXAM: CHEST  1 VIEW  COMPARISON:  Jan 06, 2021 study obtained earlier in the day  FINDINGS: Chest tube position unchanged. Pneumothorax on the right may be slightly smaller than on earlier in the day. Subcutaneous air again noted on the right. There is mild scarring in the right base medially. There is equivocal right pleural effusion. No edema or airspace opacity. Heart size and pulmonary vascularity are normal. Status post coronary artery bypass grafting. Postoperative change in lower cervical spine. No bone lesions.  IMPRESSION: Chest tube again noted on the right with right apical pneumothorax slightly smaller compared to earlier in the day. Equivocal right pleural effusion with mild scarring right base. No edema or airspace opacity. Stable cardiac silhouette.   Electronically Signed   By: Lowella Grip III M.D.   On: 01/06/2021 14:02  Results shared with Dr. Kipp Brood. Okay to discontinue chest tube at this time with a follow-up chest xray in the morning.   Nicholes Rough, PA-C

## 2021-01-06 NOTE — Progress Notes (Addendum)
      [address]       [place] [address]             [phone]      6 Days Post-Op Procedure(s) (LRB): VIDEO BRONCHOSCOPY WITH INSERTION OF INTERBRONCHIAL VALVE (IBV).VALVE IN CARTRIDGE 82mm,9mm. CHEST TUBE PLACEMENT. (N/A) 25 GAUGE PARS PLANA VITRECTOMY WITH 20 GAUGE MVR PORT Subjective: Feels okay this morning, no issues, still requiring IV pain medication at times during the day and at night.   Objective: Vital signs in last 24 hours: Temp:  [97.9 F (36.6 C)-98.2 F (36.8 C)] 98.1 F (36.7 C) (05/10 0303) Pulse Rate:  [72-99] 72 (05/10 0303) Cardiac Rhythm: Normal sinus rhythm (05/10 0710) Resp:  [14-20] 16 (05/10 0303) BP: (111-133)/(73-84) 112/73 (05/10 0303) SpO2:  [95 %-97 %] 95 % (05/10 0303)    Intake/Output from previous day: 05/09 0701 - 05/10 0700 In: 240 [P.O.:240] Out: 180 [Chest Tube:180] Intake/Output this shift: No intake/output data recorded.  General appearance: alert, cooperative and no distress Heart: regular rate and rhythm, S1, S2 normal, no murmur, click, rub or gallop Lungs: clear to auscultation bilaterally Abdomen: soft, non-tender; bowel sounds normal; no masses,  no organomegaly Extremities: extremities normal, atraumatic, no cyanosis or edema Wound: clean and dry  Lab Results: No results for input(s): WBC, HGB, HCT, PLT in the last 72 hours. BMET: No results for input(s): NA, K, CL, CO2, GLUCOSE, BUN, CREATININE, CALCIUM in the last 72 hours.  PT/INR: No results for input(s): LABPROT, INR in the last 72 hours. ABG    Component Value Date/Time   PHART 7.403 12/22/2020 1408   HCO3 21.3 12/22/2020 1408   TCO2 29 06/23/2018 1038   ACIDBASEDEF 2.7 (H) 12/22/2020 1408   O2SAT 97.0 12/22/2020 1408   CBG (last 3)  No results for input(s): GLUCAP in the last 72 hours.  Assessment/Plan: S/P Procedure(s) (LRB): VIDEO BRONCHOSCOPY WITH INSERTION OF INTERBRONCHIAL VALVE (IBV).VALVE IN CARTRIDGE 81mm,9mm. CHEST TUBE  PLACEMENT. (N/A) 25 GAUGE PARS PLANA VITRECTOMY WITH 20 GAUGE MVR PORT  1. CV- NSR, BP stable- on Amiodarone, Norvasc, Catapres, Toprol XL, Ranexa 2. Pulm- CT on suction,air leak improving,CXR with small pneumo perhaps slightly larger from the previous day, good use of IS.Marland Kitchenon water seal  Plan: Clamping trial this morning, CXR at noon. The patient understands that if he becomes symptomatic he needs to let us know and we can remove the clamp. Zio patch to be ordered the day of discharge.    LOS: 13 days    [name] 01/06/2021   Agree with above Clamping trial today If stable will remove  [name]

## 2021-01-06 NOTE — TOC Progression Note (Signed)
Transition of Care [facility]) - Progression Note    Patient Details  Name: [name] MRN: [id number] Date of Birth: [birth year]  [facility] CM/SW Contact  Zenon Mayo, RN Phone Number: 01/06/2021, 2:46 PM  Clinical Narrative:    Chest tube to be d/c today per [facility] PA note.  For possible dc tomorrow. TOC team will continue to follow for dc needs.        Expected Discharge Plan and Services                                                 Social Determinants of Health (SDOH) Interventions    Readmission Risk Interventions No flowsheet data found.

## 2021-01-07 ENCOUNTER — Inpatient Hospital Stay (INDEPENDENT_AMBULATORY_CARE_PROVIDER_SITE_OTHER): Payer: Medicare Other

## 2021-01-07 ENCOUNTER — Inpatient Hospital Stay (HOSPITAL_COMMUNITY): Payer: Medicare Other

## 2021-01-07 ENCOUNTER — Other Ambulatory Visit: Payer: Self-pay | Admitting: Physician Assistant

## 2021-01-07 ENCOUNTER — Ambulatory Visit (HOSPITAL_COMMUNITY): Payer: Medicare Other | Admitting: Nurse Practitioner

## 2021-01-07 DIAGNOSIS — R002 Palpitations: Secondary | ICD-10-CM

## 2021-01-07 DIAGNOSIS — I471 Supraventricular tachycardia: Secondary | ICD-10-CM

## 2021-01-07 MED ORDER — AMIODARONE HCL 200 MG PO TABS: 200.0000 mg | ORAL_TABLET | Freq: Every day | ORAL | 1 refills | Status: DC

## 2021-01-07 MED ORDER — OXYCODONE-ACETAMINOPHEN 5-325 MG PO TABS: 1.0000 | ORAL_TABLET | Freq: Four times a day (QID) | ORAL | 0 refills | Status: DC | PRN

## 2021-01-07 NOTE — Progress Notes (Signed)
Patient complained all night about dilaudid being d/c'd without it being tapered down. Patient anxious all night, calmed with librium for sometimes.  Patient had some intermittent wheezes right after walk, resolved with nebulization.

## 2021-01-07 NOTE — Progress Notes (Signed)
      [address]       [place] [address]             [phone]      7 Days Post-Op Procedure(s) (LRB): VIDEO BRONCHOSCOPY WITH INSERTION OF INTERBRONCHIAL VALVE (IBV).VALVE IN CARTRIDGE 42mm,9mm. CHEST TUBE PLACEMENT. (N/A) 25 GAUGE PARS PLANA VITRECTOMY WITH 20 GAUGE MVR PORT   Subjective: Feels like he is going through withdrawals from the narcotics this morning. He didn't necessarily have pain overnight therefore nursing did not give him his oxycodone. Diluadid was discontinued yesterday after the chest tube was pulled. Withdrawal symptoms include anxiety, irritable, upset stomach, threw up mucous which was blood tinged. Patient is torn on what to do from a pain medicine perspective.  Objective: Vital signs in last 24 hours: Temp:  [97.7 F (36.5 C)-98.5 F (36.9 C)] 98 F (36.7 C) (05/11 0313) Pulse Rate:  [73-91] 89 (05/11 0313) Cardiac Rhythm: Sinus tachycardia;Other (Comment) (05/10 2002) Resp:  [17-19] 17 (05/11 0313) BP: (97-140)/(66-92) 114/70 (05/11 0313) SpO2:  [92 %-99 %] 95 % (05/11 0313)  Hemodynamic parameters for last 24 hours:    Intake/Output from previous day: 05/10 0701 - 05/11 0700 In: 520 [P.O.:520] Out: -  Intake/Output this shift: No intake/output data recorded.  General appearance: alert, cooperative and no distress Heart: regular rate and rhythm, S1, S2 normal, no murmur, click, rub or gallop Lungs: wheezes right upper lobe Abdomen: soft, non-tender; bowel sounds normal; no masses,  no organomegaly Extremities: extremities normal, atraumatic, no cyanosis or edema Wound: clean and dry  Lab Results: No results for input(s): WBC, HGB, HCT, PLT in the last 72 hours. BMET: No results for input(s): NA, K, CL, CO2, GLUCOSE, BUN, CREATININE, CALCIUM in the last 72 hours.  PT/INR: No results for input(s): LABPROT, INR in the last 72 hours. ABG    Component Value Date/Time   PHART 7.403 12/22/2020 1408   HCO3 21.3 12/22/2020  1408   TCO2 29 06/23/2018 1038   ACIDBASEDEF 2.7 (H) 12/22/2020 1408   O2SAT 97.0 12/22/2020 1408   CBG (last 3)  No results for input(s): GLUCAP in the last 72 hours.  Assessment/Plan: S/P Procedure(s) (LRB): VIDEO BRONCHOSCOPY WITH INSERTION OF INTERBRONCHIAL VALVE (IBV).VALVE IN CARTRIDGE 57mm,9mm. CHEST TUBE PLACEMENT. (N/A) 25 GAUGE PARS PLANA VITRECTOMY WITH 20 GAUGE MVR PORT  1. Chest tube discontinued yesterday without issue. CXR this morning looks stable but will wait for Radiology read.  2. Withdrawal-patient states he is having withdrawal symptoms from the dilaudid. He did have oxycodone available to take last night for pain but patient states he didn't have pain and therefore the nursing staff didn't give him any medicine. He is struggling with the withdrawal symptoms but also does not want to keep taking the pain medication if he does not need it.  3. NSR in the 90s, BP well controlled. Patient states he had tachycardia last night into the 130s and is concerned. He would like to see Cardiology before discharge. Also will need a Zio patch ordered.   Plan: Will discuss discharge with Dr. Kipp Brood. Since the patient had multiple things going on this morning, it will likely be later in the day.    LOS: 14 days    [name] 01/07/2021

## 2021-01-07 NOTE — Progress Notes (Signed)
Zio patch placed onto patient.  All instructions and information reviewed with patient, they verbalize understanding with no questions. 

## 2021-01-08 ENCOUNTER — Other Ambulatory Visit: Payer: Self-pay | Admitting: *Deleted

## 2021-01-08 ENCOUNTER — Other Ambulatory Visit: Payer: Self-pay | Admitting: Thoracic Surgery (Cardiothoracic Vascular Surgery)

## 2021-01-08 ENCOUNTER — Telehealth: Payer: Self-pay | Admitting: Interventional Cardiology

## 2021-01-08 DIAGNOSIS — Z951 Presence of aortocoronary bypass graft: Secondary | ICD-10-CM

## 2021-01-08 DIAGNOSIS — R911 Solitary pulmonary nodule: Secondary | ICD-10-CM

## 2021-01-08 NOTE — Telephone Encounter (Signed)
Instructed patient to go ahead and mail back monitor that fell off.  Since the monitor fell off within the first 24 hours, it is highly unlikely he would be able to reattach the one patch monitor system to his chest.   He agreed, stating there was no stickiness left on patch. I will contact our [employer] representative to have a replacement monitor expedite shipped to his home and send him instructions by my chart message.

## 2021-01-08 NOTE — Telephone Encounter (Signed)
Patient had a heart monitor placed while he was in the hospital. He states yesterday morning the monitor fell off. He is unsure how to reapply it and he is hoping he can come by the office for assistance with putting it back on.

## 2021-01-08 NOTE — Progress Notes (Signed)
The proposed treatment discussed in cancer conference is for discussion purpose only and is not a binding recommendation. The patient was not physically examined nor present for their treatment options. Therefore, final treatment plans cannot be decided.  ?

## 2021-01-09 ENCOUNTER — Telehealth: Payer: Self-pay | Admitting: *Deleted

## 2021-01-09 ENCOUNTER — Ambulatory Visit (INDEPENDENT_AMBULATORY_CARE_PROVIDER_SITE_OTHER): Payer: Self-pay | Admitting: Thoracic Surgery (Cardiothoracic Vascular Surgery)

## 2021-01-09 ENCOUNTER — Other Ambulatory Visit: Payer: Self-pay

## 2021-01-09 ENCOUNTER — Ambulatory Visit: Payer: Medicare Other | Admitting: Thoracic Surgery (Cardiothoracic Vascular Surgery)

## 2021-01-09 ENCOUNTER — Encounter: Payer: Self-pay | Admitting: Thoracic Surgery (Cardiothoracic Vascular Surgery)

## 2021-01-09 VITALS — BP 119/55 | HR 104 | Resp 20 | Ht 69.0 in | Wt 187.0 lb

## 2021-01-09 DIAGNOSIS — Z902 Acquired absence of lung [part of]: Secondary | ICD-10-CM

## 2021-01-09 DIAGNOSIS — Z09 Encounter for follow-up examination after completed treatment for conditions other than malignant neoplasm: Secondary | ICD-10-CM

## 2021-01-09 NOTE — Progress Notes (Signed)
[address]       [place] [address]             [phone]        Avigdor Edward Vandenheuvel [facility] Medical Record #[id number] Date of Birth: [birth year]  Referring: Garner Nash, DO Primary Care: [name], MD Primary Cardiologist:Jayadeep Irish Lack, MD  Reason for visit:   follow-up  History of Present Illness:     Patient presents for his 1 week follow-up.  He underwent a right upper lobectomy which was complicated by prolonged air leak.  He subsequently underwent endobronchial valve placement x2.  This pain is much improved.  He does admit to some exertional dyspnea.  Physical Exam: BP (!) 119/55   Pulse (!) 104   Resp 20   Ht 5\' 9"  (1.753 m)   Wt 187 lb (84.8 kg)   SpO2 98% Comment: RA  BMI 27.62 kg/m   Alert NAD Incision clean, stitches removed.   Abdomen soft, ND No peripheral edema   Diagnostic Studies & Laboratory data:  Path:  A. LYMPH NODE, 9, EXCISION:  - Lymph node negative for metastatic carcinoma (0/1).   B. LYMPH NODE, HILAR, EXCISION:  - Lymph node negative for metastatic carcinoma (0/1).   C. LYMPH NODE, HILAR #2, EXCISION:  - Lymph node positive for metastatic carcinoma (1/1).   D. LYMPH NODE, HILAR #3, EXCISION:  - Lymph node positive for metastatic carcinoma (1/1).   E. LYMPH NODE, HILAR #4, EXCISION:  - Lymph node positive for metastatic carcinoma (1/1).   F. LUNG, RIGHT UPPER LOBE, LOBECTOMY:  Amended diagnosis:  - Large cell neuroendocrine carcinoma, 2.4 cm.  - Margins not involved.  - No pleural invasion.  - See oncology table and amendment comment.   Previous diagnosis:  - Poorly differentiated adenocarcinoma, 2.4 cm.  - Margins not involved.  - No pleural invasion.  - See oncology table.   ONCOLOGY TABLE:  LUNG: Resection  Total Number of Primary Tumors: One.  Procedure: Right upper lobectomy and lymph node biopsies.  Specimen Laterality: Right.  Tumor Focality: Unifocal.  Tumor Site:  Right upper lobe.  Tumor Size: 2.4 x 2 x 1.9 cm.  Histologic Type: Poorly differentiated adenocarcinoma.  Visceral Pleura Invasion: Not identified.  Direct Invasion of Adjacent Structures: No adjacent structures present.  Lymphovascular Invasion: Present.  Margins: All margins negative for carcinoma.    Closest Margin(s) to Invasive Carcinoma: Bronchial, 0.9 cm.  Treatment Effect: No known presurgical therapy.  Regional Lymph Nodes:    Number of Lymph Nodes Involved: 3               Nodal Sites with Tumor: Hilar.               Number of Lymph Nodes Examined: 5               Nodal Sites Examined: 2  Pathologic Stage Classification (pTNM, AJCC 8th Edition): pT1c, pN1     Assessment / Plan:   65 year old male with a T1CN1 M0 non-small cell lung cancer.  This case was discussed in our tumor board.  Concern is that the navigational bronchoscopy path results showing small cell lung cancer, but on further review it was determined that this is more consistent with large cell lung cancer.  He has an appointment to meet with her medical oncologist for adjuvant therapy.  I will see him back in 1 month with a chest x-ray and scheduling for endobronchial  valve removal   [name] 01/09/2021 3:58 PM

## 2021-01-09 NOTE — Telephone Encounter (Signed)
I received referral on Mr. Filter.  I called and scheduled him to be seen on 5/18 with Cassie and Dr. [name] with labs. He verbalized understanding of appt time and place.

## 2021-01-10 DIAGNOSIS — I471 Supraventricular tachycardia: Secondary | ICD-10-CM

## 2021-01-10 DIAGNOSIS — R002 Palpitations: Secondary | ICD-10-CM | POA: Diagnosis not present

## 2021-01-12 ENCOUNTER — Ambulatory Visit (INDEPENDENT_AMBULATORY_CARE_PROVIDER_SITE_OTHER): Payer: Medicare Other | Admitting: Pulmonary Disease

## 2021-01-12 ENCOUNTER — Encounter: Payer: Self-pay | Admitting: *Deleted

## 2021-01-12 ENCOUNTER — Encounter: Payer: Self-pay | Admitting: Pulmonary Disease

## 2021-01-12 ENCOUNTER — Other Ambulatory Visit: Payer: Self-pay

## 2021-01-12 VITALS — BP 104/60 | HR 90 | Ht 69.0 in | Wt 191.0 lb

## 2021-01-12 DIAGNOSIS — R062 Wheezing: Secondary | ICD-10-CM | POA: Diagnosis not present

## 2021-01-12 DIAGNOSIS — Z902 Acquired absence of lung [part of]: Secondary | ICD-10-CM

## 2021-01-12 DIAGNOSIS — Z87891 Personal history of nicotine dependence: Secondary | ICD-10-CM | POA: Diagnosis not present

## 2021-01-12 DIAGNOSIS — M5440 Lumbago with sciatica, unspecified side: Secondary | ICD-10-CM | POA: Insufficient documentation

## 2021-01-12 DIAGNOSIS — C7A1 Malignant poorly differentiated neuroendocrine tumors: Secondary | ICD-10-CM

## 2021-01-12 DIAGNOSIS — F319 Bipolar disorder, unspecified: Secondary | ICD-10-CM | POA: Insufficient documentation

## 2021-01-12 DIAGNOSIS — J309 Allergic rhinitis, unspecified: Secondary | ICD-10-CM | POA: Insufficient documentation

## 2021-01-12 DIAGNOSIS — R413 Other amnesia: Secondary | ICD-10-CM | POA: Insufficient documentation

## 2021-01-12 DIAGNOSIS — N200 Calculus of kidney: Secondary | ICD-10-CM | POA: Insufficient documentation

## 2021-01-12 DIAGNOSIS — M5430 Sciatica, unspecified side: Secondary | ICD-10-CM | POA: Insufficient documentation

## 2021-01-12 MED ORDER — STIOLTO RESPIMAT 2.5-2.5 MCG/ACT IN AERS: 2.0000 | INHALATION_SPRAY | Freq: Every day | RESPIRATORY_TRACT | 3 refills | Status: DC

## 2021-01-12 MED ORDER — FLUTICASONE PROPIONATE 50 MCG/ACT NA SUSP: 2.0000 | Freq: Every day | NASAL | 2 refills | Status: DC

## 2021-01-12 MED ORDER — STIOLTO RESPIMAT 2.5-2.5 MCG/ACT IN AERS: 2.0000 | INHALATION_SPRAY | Freq: Every day | RESPIRATORY_TRACT | 0 refills | Status: DC

## 2021-01-12 MED ORDER — ALBUTEROL SULFATE HFA 108 (90 BASE) MCG/ACT IN AERS: 2.0000 | INHALATION_SPRAY | Freq: Four times a day (QID) | RESPIRATORY_TRACT | 2 refills | Status: DC | PRN

## 2021-01-12 NOTE — Progress Notes (Signed)
Synopsis: Referred in March 2022 for upper lobe pulmonary nodule by [name], MD  Subjective:   PATIENT ID: [name] GENDER: male DOB: August 30, 1956, MRN: [id number]  Chief Complaint  Patient presents with  . Follow-up    Lung nodule    This is a 65 year old gentleman, history of STEMI 2017, carotid artery disease, prior ischemic cardiomyopathy with improved ejection fraction.  Patient currently on Plavix.  Recently seen by cardiology.  Patient had a CT scan of the chest completed during an emergency department visit on 10/24/2020.  This revealed a new 1.7 cm right upper lobe pulmonary nodule concerning for primary bronchogenic carcinoma.  Patient was referred for evaluation of this abnormality.  From respiratory standpoint he still feels short of breath with significant exertion but he is slowly getting better.  Was diagnosed with COVID in January of this past year.  He went to the ER with chest pains and shortness of breath.  He had a VQ scan which was negative for PE.  CT scan of the chest revealed this new upper lobe lung nodule.  We also compared this to his previous CT imaging in March 2021.  Nodule was not present at this time.  OV 01/12/2021: Patient here today for follow-up after recent surgery.  Patient was referred to Dr. Kipp Brood regarding upper lobe nodule within the right side.  Patient had combined navigational bronchoscopy followed by robotic assisted thoracic surgery with right upper lobectomy with a single anesthetic event.  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  Patient had hilar nodes that were positive.  Diagnosed with a pT1c, PN 1 M0 disease.  Patient has been referred to medical oncology for adjuvant chemotherapy.  Here today in the office for follow-up.  He did have  endobronchial valves placed for persistent leak.  Chest tube has been removed.  He is still struggling with shortness of breath and wheezing.  Nocturnal symptoms seem to be worse.  He was up walking around last night.  Predominantly associated with wheezing within the right side of the chest.   Past Medical History:  Diagnosis Date  . Anxiety   . Arthritis   . Basal cell carcinoma (BCC) of forehead   . CAD (coronary artery disease)    a. 10/2015 ant STEMI >> LHC with 3 v CAD; oLAD tx with POBA >> emergent CABG. b. Multiple evals since that time, early graft failure of SVG-RCA by cath 03/2016. c. 2/19 PCI/DES x1 to pRCA, normal EF.  . Carotid artery disease ([place])    a. 40-59% BICA 02/2018.  Marland Kitchen Depression   . Dyspnea   . Ectopic atrial tachycardia ([place])   . Esophageal reflux    eosinophil esophagitis  . Family history of adverse reaction to anesthesia    "sister has PONV" (06/21/2017)  . Former tobacco use   . Gout   . Hepatitis C    "treated and cured" (06/21/2017)  . High cholesterol   . History of kidney stones   . Hypertension   . Ischemic cardiomyopathy    a. EF 25-30% at intraop TEE 4/17  //  b. Limited Echo 5/17 - EF 45-50%, mild ant HK. c. EF 55-65% by cath 09/2017.  . Migraine    "3-4/yr" (06/21/2017)  . Myocardial infarction ([place]) 10/2015  . Palpitations   . Sinus bradycardia  a. HR dropping into 40s in 02/2016 -> BB reduced.  . Stroke ([place]) 10/2016   "small one; sometimes my memory/cognitive issues" (06/21/2017)  . Symptomatic hypotension    a. 02/2016 ER visit -> meds reduced.  . Syncope      Family History  Problem Relation Age of Onset  . Lung cancer Mother   . Heart Problems Father   . Heart attack Father 23  . Stroke Father   . Heart failure Father   . Heart attack Maternal Grandmother   . Stroke Maternal Grandmother   . Heart attack Paternal Uncle   . Hypertension Brother   . Autoimmune disease Neg Hx      Past Surgical History:  Procedure Laterality  Date  . [place] VITRECTOMY WITH 20 GAUGE MVR PORT  12/31/2020   Procedure: 25 GAUGE PARS PLANA VITRECTOMY WITH 20 GAUGE MVR PORT;  Surgeon: [name], MD;  [facility]: [facility];  Service: Thoracic;;  . ANTERIOR CERVICAL DECOMP/DISCECTOMY FUSION N/A 10/17/2018   Procedure: Anterior Cervical Decompression Fusion - Cervical seven -Thoracic one;  Surgeon: Consuella Lose, MD;  Location: [facility];  Service: Neurosurgery;  Laterality: N/A;  . BASAL CELL CARCINOMA EXCISION     "forehead  . BIOPSY  07/20/2019   Procedure: BIOPSY;  Surgeon: Carol Ada, MD;  [facility];  Service: Endoscopy;;  . BRONCHIAL BIOPSY  12/24/2020   Procedure: BRONCHIAL BIOPSIES;  Surgeon: Garner Nash, DO;  Location: [facility];  Service: Pulmonary;;  . BRONCHIAL BRUSHINGS  12/24/2020   Procedure: BRONCHIAL BRUSHINGS;  Surgeon: Garner Nash, DO;  Location: [facility];  Service: Pulmonary;;  . BRONCHIAL NEEDLE ASPIRATION BIOPSY  12/24/2020   Procedure: BRONCHIAL NEEDLE ASPIRATION BIOPSIES;  Surgeon: Garner Nash, DO;  Location: [facility];  Service: Pulmonary;;  . CARDIAC CATHETERIZATION N/A 11/28/2015   Procedure: Left Heart Cath and Coronary Angiography;  Surgeon: [name], MD;  Location: [facility];  Service: Cardiovascular;  Laterality: N/A;  . CARDIAC CATHETERIZATION N/A 11/28/2015   Procedure: Coronary Balloon Angioplasty;  Surgeon: [name], MD;  Location: [facility];  Service: Cardiovascular;  Laterality: N/A;  ostial LAD  . CARDIAC CATHETERIZATION N/A 11/28/2015   Procedure: Coronary/Graft Angiography;  Surgeon: [name], MD;  Location: [facility];  Service: Cardiovascular;  Laterality: N/A;  coronaries only   . CARDIAC CATHETERIZATION N/A 04/21/2016   Procedure: Left Heart Cath and Coronary Angiography;  Surgeon: Wellington Hampshire, MD;  Location: [facility];  Service: Cardiovascular;  Laterality: N/A;  . CARDIAC  CATHETERIZATION N/A 06/14/2016   Procedure: Left Heart Cath and Cors/Grafts Angiography;  Surgeon: Lorretta Harp, MD;  Location: [facility];  Service: Cardiovascular;  Laterality: N/A;  . CARDIAC CATHETERIZATION N/A 09/08/2016   Procedure: Left Heart Cath and Cors/Grafts Angiography;  Surgeon: Wellington Hampshire, MD;  Location: [facility];  Service: Cardiovascular;  Laterality: N/A;  . CARDIAC CATHETERIZATION    . CORONARY ARTERY BYPASS GRAFT N/A 11/28/2015   Procedure: CORONARY ARTERY BYPASS GRAFTING (CABG) TIMES FIVE USING LEFT INTERNAL MAMMARY ARTERY AND RIGHT GREATER SAPHENOUS,VIEN HARVEATED BY ENDOVIEN, INTRAOPPRATIVE TEE;  Surgeon: Gaye Pollack, MD;  Location: [facility];  Service: Open Heart Surgery;  Laterality: N/A;  . CORONARY STENT INTERVENTION N/A 10/05/2017   Procedure: CORONARY STENT INTERVENTION;  Surgeon: [name], MD;  Location: [facility];  Service: Cardiovascular;  Laterality: N/A;  . ESOPHAGOGASTRODUODENOSCOPY (EGD)  WITH PROPOFOL N/A 07/20/2019   Procedure: ESOPHAGOGASTRODUODENOSCOPY (EGD) WITH PROPOFOL;  Surgeon: Carol Ada, MD;  [facility];  Service: Endoscopy;  Laterality: N/A;  . FIDUCIAL MARKER PLACEMENT  12/24/2020   Procedure: FIDUCIAL DYE MARKER PLACEMENT;  Surgeon: Garner Nash, DO;  Location: [facility];  Service: Pulmonary;;  . HUMERUS SURGERY Right 1969   "tumor inside bone; filled it w/bone chips"  . INTERCOSTAL NERVE BLOCK Right 12/24/2020   Procedure: INTERCOSTAL NERVE BLOCK;  Surgeon: [name], MD;  Location: [facility];  Service: Thoracic;  Laterality: Right;  . LEFT HEART CATH AND CORS/GRAFTS ANGIOGRAPHY N/A 03/11/2017   Procedure: Left Heart Cath and Cors/Grafts Angiography;  Surgeon: Leonie Man, MD;  Location: [facility];  Service: Cardiovascular;  Laterality: N/A;  . LEFT HEART CATH AND CORS/GRAFTS ANGIOGRAPHY N/A 10/05/2017   Procedure: LEFT HEART CATH AND CORS/GRAFTS ANGIOGRAPHY;   Surgeon: [name], MD;  Location: [facility];  Service: Cardiovascular;  Laterality: N/A;  . LEFT HEART CATH AND CORS/GRAFTS ANGIOGRAPHY N/A 04/11/2019   Procedure: LEFT HEART CATH AND CORS/GRAFTS ANGIOGRAPHY;  Surgeon: [name], MD;  Location: [facility];  Service: Cardiovascular;  Laterality: N/A;  . NODE DISSECTION Right 12/24/2020   Procedure: NODE DISSECTION;  Surgeon: [name], MD;  Location: [facility];  Service: Thoracic;  Laterality: Right;  . PERIPHERAL VASCULAR CATHETERIZATION N/A 06/14/2016   Procedure: Lower Extremity Angiography;  Surgeon: Lorretta Harp, MD;  Location: [facility];  Service: Cardiovascular;  Laterality: N/A;  . VIDEO BRONCHOSCOPY WITH ENDOBRONCHIAL NAVIGATION Right 12/24/2020   Procedure: VIDEO BRONCHOSCOPY WITH ENDOBRONCHIAL NAVIGATION;  Surgeon: Garner Nash, DO;  Location: [facility];  Service: Pulmonary;  Laterality: Right;  Mar[place] Kitchen VIDEO BRONCHOSCOPY WITH ENDOBRONCHIAL ULTRASOUND N/A 12/24/2020   Procedure: VIDEO BRONCHOSCOPY WITH ENDOBRONCHIAL ULTRASOUND;  Surgeon: Garner Nash, DO;  Location: [facility];  Service: Pulmonary;  Laterality: N/A;  . VIDEO BRONCHOSCOPY WITH INSERTION OF INTERBRONCHIAL VALVE (IBV) N/A 12/31/2020   Procedure: VIDEO BRONCHOSCOPY WITH INSERTION OF INTERBRONCHIAL VALVE (IBV).VALVE IN CARTRIDGE 71mm,9mm. CHEST TUBE PLACEMENT.;  Surgeon: [name], MD;  [facility];  Service: Thoracic;  Laterality: N/A;    Social History   Socioeconomic History  . Marital status: Married    Spouse name: [name]  . Number of children: 3  . Years of education: College  . Highest education level: Not on file  Occupational History  . Occupation: Scientist, research (physical sciences): SELF-EMPLOYED  Tobacco Use  . Smoking status: Former Smoker    Packs/day: 0.75    Years: 44.00    Pack years: 33.00    Types: Cigarettes    Quit date: 11/28/2015    Years since quitting: 5.1  . Smokeless  tobacco: Never Used  Vaping Use  . Vaping Use: Never used  Substance and Sexual Activity  . Alcohol use: Yes    Comment: occ  . Drug use: No    Comment: 06/21/2017 "nothing since the 1980s"  . Sexual activity: Yes  Other Topics Concern  . Not on file  Social History Narrative   Patient lives at home with his spouse.   Caffeine Use: yes   Social Determinants of Health   Financial Resource Strain: Not on file  Food Insecurity: Not on file  Transportation Needs: Not on file  Physical Activity: Not on file  Stress: Not on file  Social Connections: Not on file  Intimate Partner Violence: Not on file  Allergies  Allergen Reactions  . Prednisone Other (See Comments)    States that this med makes him "crazy"  . Tetanus Toxoids Swelling and Other (See Comments)    Fever, Swelling of the arm   . Wellbutrin [Bupropion] Other (See Comments)    Crazy thoughts, nightmares  . Indomethacin     Other reaction(s): rectal bleeding  . Morphine And Related Hives and Itching    Redness at the injection site  . Other     Other reaction(s): Unknown  . Scallops [Shellfish Allergy] Nausea Only  . Chantix [Varenicline] Other (See Comments)    Dreams     Outpatient Medications Prior to Visit  Medication Sig Dispense Refill  . amiodarone (PACERONE) 200 MG tablet Take 1 tablet (200 mg total) by mouth daily. 30 tablet 1  . amLODipine (NORVASC) 10 MG tablet TAKE 1 TABLET(10 MG) BY MOUTH DAILY (Patient taking differently: Take 10 mg by mouth daily.) 90 tablet 1  . aspirin 81 MG tablet Take 1 tablet (81 mg total) by mouth daily. 30 tablet   . atorvastatin (LIPITOR) 80 MG tablet TAKE 1 TABLET(80 MG) BY MOUTH DAILY (Patient taking differently: Take 80 mg by mouth daily.) 90 tablet 2  . chlordiazePOXIDE (LIBRIUM) 10 MG capsule Take 10-20 mg by mouth 3 (three) times daily as needed for anxiety.     . cloNIDine (CATAPRES) 0.1 MG tablet Take 0.1 mg by mouth at bedtime.    . clopidogrel (PLAVIX) 75 MG  tablet TAKE 1 TABLET BY MOUTH EVERY DAY (Patient taking differently: Take 75 mg by mouth daily.) 90 tablet 1  . fluticasone (FLONASE) 50 MCG/ACT nasal spray Place 1 spray into both nostrils daily as needed for allergies.    . metoprolol succinate (TOPROL-XL) 25 MG 24 hr tablet Take 1 tablet (25 mg total) by mouth daily. (Patient taking differently: Take 25 mg by mouth daily. Additional if needed for increase pulse rate) 90 tablet 3  . Multiple Vitamins-Minerals (ONE-A-DAY MENS 50+ ADVANTAGE PO) Take 1 tablet by mouth daily.     . nitroGLYCERIN (NITROSTAT) 0.4 MG SL tablet PLACE 1 TABLET UNDER THE TONGUE EVERY 5 MINUTES AS NEEDED FOR CHEST PAIN. 3 DOSES MAX (Patient taking differently: Place 0.4 mg under the tongue every 5 (five) minutes as needed for chest pain. 3 DOSES MAX) 25 tablet 4  . oxyCODONE-acetaminophen (PERCOCET/ROXICET) 5-325 MG tablet Take 1 tablet by mouth every 6 (six) hours as needed for moderate pain. 30 tablet 0  . ranolazine (RANEXA) 1000 MG SR tablet Take 1 tablet (1,000 mg total) by mouth 2 (two) times daily. 180 tablet 0   No facility-administered medications prior to visit.    Review of Systems  Constitutional: Negative for chills, fever, malaise/fatigue and weight loss.  HENT: Negative for hearing loss, sore throat and tinnitus.   Eyes: Negative for blurred vision and double vision.  Respiratory: Positive for shortness of breath and wheezing. Negative for cough, hemoptysis, sputum production and stridor.   Cardiovascular: Negative for chest pain, palpitations, orthopnea, leg swelling and PND.  Gastrointestinal: Negative for abdominal pain, constipation, diarrhea, heartburn, nausea and vomiting.  Genitourinary: Negative for dysuria, hematuria and urgency.  Musculoskeletal: Negative for joint pain and myalgias.  Skin: Negative for itching and rash.  Neurological: Negative for dizziness, tingling, weakness and headaches.  Endo/Heme/Allergies: Negative for environmental  allergies. Does not bruise/bleed easily.  Psychiatric/Behavioral: Negative for depression. The patient is not nervous/anxious and does not have insomnia.   All other systems reviewed and are  negative.    Objective:  Physical Exam Vitals reviewed.  Constitutional:      General: He is not in acute distress.    Appearance: He is well-developed.  HENT:     Head: Normocephalic and atraumatic.  Eyes:     General: No scleral icterus.    Conjunctiva/sclera: Conjunctivae normal.     Pupils: Pupils are equal, round, and reactive to light.  Neck:     Vascular: No JVD.     Trachea: No tracheal deviation.  Cardiovascular:     Rate and Rhythm: Normal rate and regular rhythm.     Heart sounds: Normal heart sounds. No murmur heard.   Pulmonary:     Effort: Pulmonary effort is normal. No tachypnea, accessory muscle usage or respiratory distress.     Breath sounds: No stridor. Wheezing present. No rhonchi or rales.     Comments: Wheezing in the right chest Abdominal:     General: Bowel sounds are normal. There is no distension.     Palpations: Abdomen is soft.     Tenderness: There is no abdominal tenderness.  Musculoskeletal:        General: No tenderness.     Cervical back: Neck supple.  Lymphadenopathy:     Cervical: No cervical adenopathy.  Skin:    General: Skin is warm and dry.     Capillary Refill: Capillary refill takes less than 2 seconds.     Findings: No rash.  Neurological:     Mental Status: He is alert and oriented to person, place, and time.  Psychiatric:        Behavior: Behavior normal.      Vitals:   01/12/21 1050  BP: 104/60  Pulse: 90  SpO2: 99%  Weight: 191 lb (86.6 kg)  Height: 5\' 9"  (1.753 m)   99% on RA BMI Readings from Last 3 Encounters:  01/12/21 28.21 kg/m  01/09/21 27.62 kg/m  12/24/20 28.80 kg/m   Wt Readings from Last 3 Encounters:  01/12/21 191 lb (86.6 kg)  01/09/21 187 lb (84.8 kg)  12/24/20 195 lb (88.5 kg)     CBC     Component Value Date/Time   WBC 10.0 12/30/2020 0120   RBC 3.85 (L) 12/30/2020 0120   HGB 11.6 (L) 12/30/2020 0120   HCT 35.4 (L) 12/30/2020 0120   PLT 236 12/30/2020 0120   MCV 91.9 12/30/2020 0120   MCH 30.1 12/30/2020 0120   MCHC 32.8 12/30/2020 0120   RDW 13.1 12/30/2020 0120   LYMPHSABS 2.4 11/18/2020 0628   MONOABS 0.7 11/18/2020 0628   EOSABS 0.3 11/18/2020 0628   BASOSABS 0.1 11/18/2020 8469    Chest Imaging: CT scan of the chest 10/24/2020: CTA revealed a new 1.7 cm right upper lobe pulmonary nodule concerning for a primary bronchogenic carcinoma. No other thoracic adenopathy noted. The patient's images have been independently reviewed by me.   01/07/2021: Chest x-ray: Small right apical pneumothorax. Stable The patient's images have been independently reviewed by me.      Pulmonary Functions Testing Results: PFT Results Latest Ref Rng & Units 11/13/2020  FVC-Pre L 3.08  FVC-Predicted Pre % 69  FVC-Post L 3.45  FVC-Predicted Post % 77  Pre FEV1/FVC % % 61  Post FEV1/FCV % % 65  FEV1-Pre L 1.89  FEV1-Predicted Pre % 57  FEV1-Post L 2.25  DLCO uncorrected ml/min/mmHg 23.39  DLCO UNC% % 90  DLCO corrected ml/min/mmHg 23.20  DLCO COR %Predicted % 89  DLVA Predicted % 101  TLC L 6.43  TLC % Predicted % 94  RV % Predicted % 147    FeNO:   Pathology:   A. LYMPH NODE, 9, EXCISION:  - Lymph node negative for metastatic carcinoma (0/1).   B. LYMPH NODE, HILAR, EXCISION:  - Lymph node negative for metastatic carcinoma (0/1).   C. LYMPH NODE, HILAR #2, EXCISION:  - Lymph node positive for metastatic carcinoma (1/1).   D. LYMPH NODE, HILAR #3, EXCISION:  - Lymph node positive for metastatic carcinoma (1/1).   E. LYMPH NODE, HILAR #4, EXCISION:  - Lymph node positive for metastatic carcinoma (1/1).   F. LUNG, RIGHT UPPER LOBE, LOBECTOMY:  Amended diagnosis:  - Large cell neuroendocrine carcinoma, 2.4 cm.  - Margins not involved.  - No pleural invasion.   - See oncology table and amendment comment.   Previous diagnosis:  - Poorly differentiated adenocarcinoma, 2.4 cm.  - Margins not involved.  - No pleural invasion.  - See oncology table.   ONCOLOGY TABLE:  LUNG: Resection  Total Number of Primary Tumors: One.  Procedure: Right upper lobectomy and lymph node biopsies.  Specimen Laterality: Right.  Tumor Focality: Unifocal.  Tumor Site: Right upper lobe.  Tumor Size: 2.4 x 2 x 1.9 cm.  Histologic Type: Poorly differentiated adenocarcinoma.  Visceral Pleura Invasion: Not identified.  Direct Invasion of Adjacent Structures: No adjacent structures present.  Lymphovascular Invasion: Present.  Margins: All margins negative for carcinoma.    Closest Margin(s) to Invasive Carcinoma: Bronchial, 0.9 cm.  Treatment Effect: No known presurgical therapy.  Regional Lymph Nodes:    Number of Lymph Nodes Involved: 3               Nodal Sites with Tumor: Hilar.               Number of Lymph Nodes Examined: 5               Nodal Sites Examined: 2  Pathologic Stage Classification (pTNM, AJCC 8th Edition): pT1c, pN1   Echocardiogram:   Heart Catheterization:     Assessment & Plan:     ICD-10-CM   1. 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 Wheeze  R06.2   3. Former smoker  Z87.891   4. S/P lobectomy of lung  Z90.2     Discussion:  This is a 65 year old gentleman, former smoker, right upper lobe solitary pulmonary nodule, taken for combined navigation plus robotic assisted lobectomy.  Final pathology with large cell neuroendocrine carcinoma.  Patient has been referred to medical oncology for adjuvant chemotherapy.  Hospital course from surgical complicated by persistent air leak ultimately had valve placement.  Chest tube is removed present.  Persistent small apical not pneumothorax stable.  Plan: Follow-up with medical oncology already scheduled As for his  ongoing wheezing and likely diagnosis of COPD.  We will start [place]. Samples given new prescription today Albuterol inhaler prescription today. Can use this for shortness of breath chest tightness and wheezing. Patient to be instructed on appropriate inhaler technique Follow-up with [name] in 3 months or as needed based on symptoms.    Current Outpatient Medications:  .  amiodarone (PACERONE) 200 MG tablet, Take 1 tablet (200 mg total) by mouth daily., Disp: 30 tablet, Rfl: 1 .  amLODipine (NORVASC) 10 MG tablet, TAKE 1 TABLET(10 MG) BY MOUTH DAILY (Patient taking differently: Take 10 mg by mouth daily.), Disp: 90 tablet, Rfl: 1 .  aspirin 81 MG tablet,  Take 1 tablet (81 mg total) by mouth daily., Disp: 30 tablet, Rfl:  .  atorvastatin (LIPITOR) 80 MG tablet, TAKE 1 TABLET(80 MG) BY MOUTH DAILY (Patient taking differently: Take 80 mg by mouth daily.), Disp: 90 tablet, Rfl: 2 .  chlordiazePOXIDE (LIBRIUM) 10 MG capsule, Take 10-20 mg by mouth 3 (three) times daily as needed for anxiety. , Disp: , Rfl:  .  cloNIDine (CATAPRES) 0.1 MG tablet, Take 0.1 mg by mouth at bedtime., Disp: , Rfl:  .  clopidogrel (PLAVIX) 75 MG tablet, TAKE 1 TABLET BY MOUTH EVERY DAY (Patient taking differently: Take 75 mg by mouth daily.), Disp: 90 tablet, Rfl: 1 .  fluticasone (FLONASE) 50 MCG/ACT nasal spray, Place 1 spray into both nostrils daily as needed for allergies., Disp: , Rfl:  .  metoprolol succinate (TOPROL-XL) 25 MG 24 hr tablet, Take 1 tablet (25 mg total) by mouth daily. (Patient taking differently: Take 25 mg by mouth daily. Additional if needed for increase pulse rate), Disp: 90 tablet, Rfl: 3 .  Multiple Vitamins-Minerals (ONE-A-DAY MENS 50+ ADVANTAGE PO), Take 1 tablet by mouth daily. , Disp: , Rfl:  .  nitroGLYCERIN (NITROSTAT) 0.4 MG SL tablet, PLACE 1 TABLET UNDER THE TONGUE EVERY 5 MINUTES AS NEEDED FOR CHEST PAIN. 3 DOSES MAX (Patient taking differently: Place 0.4 mg under the tongue every 5 (five)  minutes as needed for chest pain. 3 DOSES MAX), Disp: 25 tablet, Rfl: 4 .  oxyCODONE-acetaminophen (PERCOCET/ROXICET) 5-325 MG tablet, Take 1 tablet by mouth every 6 (six) hours as needed for moderate pain., Disp: 30 tablet, Rfl: 0 .  ranolazine (RANEXA) 1000 MG SR tablet, Take 1 tablet (1,000 mg total) by mouth 2 (two) times daily., Disp: 180 tablet, Rfl: 0   Garner Nash, DO [facility] 01/12/2021 11:16 AM

## 2021-01-12 NOTE — Patient Instructions (Addendum)
Thank you for visiting Dr. Valeta Harms at [facility]. Today we recommend the following:  Stiol[place] samples today and new prescription  Albuterol inhaler as needed  Return in about 3 months (around 04/14/2021) for with APP or Dr. Valeta Harms.    Please do your part to reduce the spread of COVID-19.

## 2021-01-12 NOTE — Addendum Note (Signed)
Addended by: Vanessa Barbara on: 01/12/2021 11:52 AM   Modules accepted: Orders

## 2021-01-12 NOTE — Progress Notes (Signed)
Per [facility] patient portal, molecular and PDL 1 testing received and will be resulted out on 01/23/21.

## 2021-01-13 ENCOUNTER — Telehealth: Payer: Self-pay | Admitting: *Deleted

## 2021-01-13 NOTE — Telephone Encounter (Signed)
[name] contacted the office stating he has been feeling well sine discharge, however last night he experienced pain that kept him awake. Patient denies SOB and states his breathing has been much better of late. Patient states he has only had to take his pain medication sparingly. Advised patient to try to take the medication on more of a schedule. Advised patient he can try over the counter sleep aids to aid in sleep if he is having a difficult time sleeping. Patient made aware he can send in medication refill request if need be. All questions answered. Patient verbalized understanding.

## 2021-01-13 NOTE — Progress Notes (Signed)
[place] [phone]   [phone]  CONSULT NOTE  REFERRING PHYSICIAN: Dr. Kipp Brood  REASON FOR CONSULTATION:  Large cell neuroendocrine carcinoma  HPI Walt Geathers is a 65 y.o. male with a past medical history significant for STEMI in 2017, carotid artery disease, prior ischemic cardiomyopathy with improved ejection, tobacco abuse, COPD, status post CABG, possible TIA, eosinophilic esophagitis, GERD, hepatitis C, atrial tachycardia, hypertension, gout, and anxiety is referred to the clinic for evaluation of newly diagnosed large cell neuroendocrine carcinoma.   The patient presented to the emergency room on 10/24/2020 for the chief complaint of shortness of breath and chest pain.  He had a CT angiogram performed which showed a solid lobar 1.7 cm right upper lobe pulmonary nodule which was suspicious for primary bronchogenic carcinoma.  From comparison, this was not present on a March 2021 scan.  The patient had a staging PET scan performed on 11/27/2020 which showed metabolic activity in the right upper lobe nodule and moderate hypermetabolic lymph node in the right hilum suspicious for potential involvement in the right hilar lymph nodes.  There is also some mild asymmetric uptake outside of the bone within the musculature around the right shoulder. He states he has a history of a torn rotator cuff in this region. There is also asymmetry of the right oropharynx.  There was also focal activity in the GE junction without a discernible mass; however, there was some mild soft tissue fullness in the area.  Radiology recommended a esophagoscopy if not already performed recently.  Per chart review, it appears that the patient saw Dr. Benson Norway on 07/20/2019 who had an upper endoscopy which showed esophagitis. There were no significant pathologic findings. He used to see Dr. Earlean Shawl who is no longer a part of that practice. The patient denise odynophagia, dysphagia,  or hematemesis. He notes occasional heartburn.   The patient was then referred to pulmonologist, Dr. Valeta Harms and cardiothoracic surgeon, Dr. Kipp Brood for further evaluation and management.  The patient had a combined navigational bronchoscopy followed by robotic assisted thoracic surgery with a right upper lobectomy which was performed on 12/24/2020.  Pathology showed this was a 2.4 cm lesion which was previously thought to represent poorly differentiated adenocarcinoma; however, further IHC staining felt this was most consistent with large cell neuroendocrine carcinoma ([id number]).  He had hilar lymph nodes that were positive.  He was diagnosed as a pT1c, PN 1, N0 disease otherwise stage IIB.   The patient's procedure was complicated by prolonged air leak which required endobronchial valve placement x2.  He has been having some shortness of breath and wheezing since his procedure which he followed up with pulmonology earlier this week who gave him inhalers.   He had PD-L1 testing performed which was 0%.  Otherwise, the patient denies any recent fever, chills, or recent night sweats. He estimates he lost about 20+ pounds since November 2021 but he attributes part of this to trying to lose weight through intermittent fasting and some from his prolonged hospitalizations. His appetite is improving. He states his breathing is getting better but he has been coughing lately. He notes he sometimes gets worsening coughing at night. He reports some post nasal drainage.  He has some chest discomfort since surgery.  He denies nausea, vomiting, recent diarrhea, or recent constipation. He had some constipation while hospitalized and some mild bright red blood in the stool after a bowel movement. He has no recurrent symptoms. He also had dark stool but  was taking Maalox at that time. Denies headaches or visual changes.  His family history consists of a mother who had lung cancer. His sister had carcinoid tumor of the  lung diagnosed 20 years ago. His father had a STEMI and TIAs. His maternal grandmother had stomach cancer.   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 He is married and has 3 children. He estimates he smoked for 40 years. Averaging ~1 pack per day. He quit 5 years ago. Denies frequent alcohol use and estimates he has had 3 alcoholic beverages this year. Denies drug use.    HPI  Past Medical History:  Diagnosis Date  . Anxiety   . Arthritis   . Basal cell carcinoma (BCC) of forehead   . CAD (coronary artery disease)    a. 10/2015 ant STEMI >> LHC with 3 v CAD; oLAD tx with POBA >> emergent CABG. b. Multiple evals since that time, early graft failure of SVG-RCA by cath 03/2016. c. 2/19 PCI/DES x1 to pRCA, normal EF.  . Carotid artery disease ([place])    a. 40-59% BICA 02/2018.  Marland Kitchen Depression   . Dyspnea   . Ectopic atrial tachycardia ([place])   . Esophageal reflux    eosinophil esophagitis  . Family history of adverse reaction to anesthesia    "sister has PONV" (06/21/2017)  . Former tobacco use   . Gout   . Hepatitis C    "treated and cured" (06/21/2017)  . High cholesterol   . History of kidney stones   . Hypertension   . Ischemic cardiomyopathy    a. EF 25-30% at intraop TEE 4/17  //  b. Limited Echo 5/17 - EF 45-50%, mild ant HK. c. EF 55-65% by cath 09/2017.  . Migraine    "3-4/yr" (06/21/2017)  . Myocardial infarction ([place]) 10/2015  . Palpitations   . Sinus bradycardia    a. HR dropping into 40s in 02/2016 -> BB reduced.  . Stroke ([place]) 10/2016   "small one; sometimes my memory/cognitive issues" (06/21/2017)  . Symptomatic hypotension    a. 02/2016 ER visit -> meds reduced.  . Syncope     Past Surgical History:  Procedure Laterality Date  . [place] VITRECTOMY WITH 20 GAUGE MVR PORT  12/31/2020   Procedure: 25 GAUGE PARS PLANA VITRECTOMY WITH 20 GAUGE MVR PORT;  Surgeon: [name], MD;  Location: [facility];  Service: Thoracic;;  . ANTERIOR CERVICAL DECOMP/DISCECTOMY  FUSION N/A 10/17/2018   Procedure: Anterior Cervical Decompression Fusion - Cervical seven -Thoracic one;  Surgeon: Consuella Lose, MD;  Location: [place];  Service: Neurosurgery;  Laterality: N/A;  . BASAL CELL CARCINOMA EXCISION     "forehead  . BIOPSY  07/20/2019   Procedure: BIOPSY;  Surgeon: Carol Ada, MD;  Location: [facility];  Service: Endoscopy;;  . BRONCHIAL BIOPSY  12/24/2020   Procedure: BRONCHIAL BIOPSIES;  Surgeon: Garner Nash, DO;  Location: [place] [facility];  Service: Pulmonary;;  . BRONCHIAL BRUSHINGS  12/24/2020   Procedure: BRONCHIAL BRUSHINGS;  Surgeon: Garner Nash, DO;  Location: [place] [facility];  Service: Pulmonary;;  . BRONCHIAL NEEDLE ASPIRATION BIOPSY  12/24/2020   Procedure: BRONCHIAL NEEDLE ASPIRATION BIOPSIES;  Surgeon: Garner Nash, DO;  Location: [place];  Service: Pulmonary;;  . CARDIAC CATHETERIZATION N/A 11/28/2015   Procedure: Left Heart Cath and Coronary Angiography;  Surgeon: [name], MD;  Location: [place] [facility];  Service: Cardiovascular;  Laterality: N/A;  . CARDIAC CATHETERIZATION N/A 11/28/2015   Procedure:  Coronary Balloon Angioplasty;  Surgeon: [name], MD;  Location: [place] [facility];  Service: Cardiovascular;  Laterality: N/A;  ostial LAD  . CARDIAC CATHETERIZATION N/A 11/28/2015   Procedure: Coronary/Graft Angiography;  Surgeon: [name], MD;  Location: [place] [facility];  Service: Cardiovascular;  Laterality: N/A;  coronaries only   . CARDIAC CATHETERIZATION N/A 04/21/2016   Procedure: Left Heart Cath and Coronary Angiography;  Surgeon: Wellington Hampshire, MD;  Location: [place] [facility];  Service: Cardiovascular;  Laterality: N/A;  . CARDIAC CATHETERIZATION N/A 06/14/2016   Procedure: Left Heart Cath and Cors/Grafts Angiography;  Surgeon: Lorretta Harp, MD;  Location: [place] [facility];  Service: Cardiovascular;  Laterality: N/A;  . CARDIAC CATHETERIZATION N/A 09/08/2016   Procedure: Left  Heart Cath and Cors/Grafts Angiography;  Surgeon: Wellington Hampshire, MD;  Location: [place] [facility];  Service: Cardiovascular;  Laterality: N/A;  . CARDIAC CATHETERIZATION    . CORONARY ARTERY BYPASS GRAFT N/A 11/28/2015   Procedure: CORONARY ARTERY BYPASS GRAFTING (CABG) TIMES FIVE USING LEFT INTERNAL MAMMARY ARTERY AND RIGHT GREATER SAPHENOUS,VIEN HARVEATED BY ENDOVIEN, INTRAOPPRATIVE TEE;  Surgeon: Gaye Pollack, MD;  Location: [place];  Service: Open Heart Surgery;  Laterality: N/A;  . CORONARY STENT INTERVENTION N/A 10/05/2017   Procedure: CORONARY STENT INTERVENTION;  Surgeon: [name], MD;  Location: [place] [facility];  Service: Cardiovascular;  Laterality: N/A;  . ESOPHAGOGASTRODUODENOSCOPY (EGD) WITH PROPOFOL N/A 07/20/2019   Procedure: ESOPHAGOGASTRODUODENOSCOPY (EGD) WITH PROPOFOL;  Surgeon: Carol Ada, MD;  [facility];  Service: Endoscopy;  Laterality: N/A;  . FIDUCIAL MARKER PLACEMENT  12/24/2020   Procedure: FIDUCIAL DYE MARKER PLACEMENT;  Surgeon: Garner Nash, DO;  Location: [facility];  Service: Pulmonary;;  . HUMERUS SURGERY Right 1969   "tumor inside bone; filled it w/bone chips"  . INTERCOSTAL NERVE BLOCK Right 12/24/2020   Procedure: INTERCOSTAL NERVE BLOCK;  Surgeon: [name], MD;  Location: [place];  Service: Thoracic;  Laterality: Right;  . LEFT HEART CATH AND CORS/GRAFTS ANGIOGRAPHY N/A 03/11/2017   Procedure: Left Heart Cath and Cors/Grafts Angiography;  Surgeon: Leonie Man, MD;  Location: [place] [facility];  Service: Cardiovascular;  Laterality: N/A;  . LEFT HEART CATH AND CORS/GRAFTS ANGIOGRAPHY N/A 10/05/2017   Procedure: LEFT HEART CATH AND CORS/GRAFTS ANGIOGRAPHY;  Surgeon: [name], MD;  Location: [place] [facility];  Service: Cardiovascular;  Laterality: N/A;  . LEFT HEART CATH AND CORS/GRAFTS ANGIOGRAPHY N/A 04/11/2019   Procedure: LEFT HEART CATH AND CORS/GRAFTS ANGIOGRAPHY;  Surgeon: [name],  MD;  Location: [place] [facility];  Service: Cardiovascular;  Laterality: N/A;  . NODE DISSECTION Right 12/24/2020   Procedure: NODE DISSECTION;  Surgeon: [name], MD;  Location: [place];  Service: Thoracic;  Laterality: Right;  . PERIPHERAL VASCULAR CATHETERIZATION N/A 06/14/2016   Procedure: Lower Extremity Angiography;  Surgeon: Lorretta Harp, MD;  Location: [place] [facility];  Service: Cardiovascular;  Laterality: N/A;  . VIDEO BRONCHOSCOPY WITH ENDOBRONCHIAL NAVIGATION Right 12/24/2020   Procedure: VIDEO BRONCHOSCOPY WITH ENDOBRONCHIAL NAVIGATION;  Surgeon: Garner Nash, DO;  Location: [place];  Service: Pulmonary;  Laterality: Right;  Mar[place] Kitchen VIDEO BRONCHOSCOPY WITH ENDOBRONCHIAL ULTRASOUND N/A 12/24/2020   Procedure: VIDEO BRONCHOSCOPY WITH ENDOBRONCHIAL ULTRASOUND;  Surgeon: Garner Nash, DO;  Location: [place];  Service: Pulmonary;  Laterality: N/A;  . VIDEO BRONCHOSCOPY WITH INSERTION OF INTERBRONCHIAL VALVE (IBV) N/A 12/31/2020   Procedure: VIDEO BRONCHOSCOPY WITH INSERTION  OF INTERBRONCHIAL VALVE (IBV).VALVE IN CARTRIDGE 29mm,9mm. CHEST TUBE PLACEMENT.;  Surgeon: [name], MD;  [facility];  Service: Thoracic;  Laterality: N/A;    Family History  Problem Relation Age of Onset  . Lung cancer Mother   . Heart Problems Father   . Heart attack Father 56  . Stroke Father   . Heart failure Father   . Heart attack Maternal Grandmother   . Stroke Maternal Grandmother   . Heart attack Paternal Uncle   . Hypertension Brother   . Autoimmune disease Neg Hx     Social History Social History   Tobacco Use  . Smoking status: Former Smoker    Packs/day: 0.75    Years: 44.00    Pack years: 33.00    Types: Cigarettes    Quit date: 11/28/2015    Years since quitting: 5.1  . Smokeless tobacco: Never Used  Vaping Use  . Vaping Use: Never used  Substance Use Topics  . Alcohol use: Yes    Comment: occ  . Drug use: No    Comment: 06/21/2017  "nothing since the 1980s"    Allergies  Allergen Reactions  . Prednisone Other (See Comments)    States that this med makes him "crazy"  . Tetanus Toxoids Swelling and Other (See Comments)    Fever, Swelling of the arm   . Wellbutrin [Bupropion] Other (See Comments)    Crazy thoughts, nightmares  . Indomethacin     Other reaction(s): rectal bleeding  . Morphine And Related Hives and Itching    Redness at the injection site  . Other     Other reaction(s): Unknown  . Scallops [Shellfish Allergy] Nausea Only  . Chantix [Varenicline] Other (See Comments)    Dreams    Current Outpatient Medications  Medication Sig Dispense Refill  . albuterol (VENTOLIN HFA) 108 (90 Base) MCG/ACT inhaler Inhale 2 puffs into the lungs every 6 (six) hours as needed for wheezing or shortness of breath. 8 g 2  . amiodarone (PACERONE) 200 MG tablet Take 1 tablet (200 mg total) by mouth daily. 30 tablet 1  . amLODipine (NORVASC) 10 MG tablet TAKE 1 TABLET(10 MG) BY MOUTH DAILY (Patient taking differently: Take 10 mg by mouth daily.) 90 tablet 1  . aspirin 81 MG tablet Take 1 tablet (81 mg total) by mouth daily. 30 tablet   . atorvastatin (LIPITOR) 80 MG tablet TAKE 1 TABLET(80 MG) BY MOUTH DAILY (Patient taking differently: Take 80 mg by mouth daily.) 90 tablet 2  . chlordiazePOXIDE (LIBRIUM) 10 MG capsule Take 10-20 mg by mouth 3 (three) times daily as needed for anxiety.     . cloNIDine (CATAPRES) 0.1 MG tablet Take 0.1 mg by mouth at bedtime.    . clopidogrel (PLAVIX) 75 MG tablet TAKE 1 TABLET BY MOUTH EVERY DAY (Patient taking differently: Take 75 mg by mouth daily.) 90 tablet 1  . fluticasone (FLONASE) 50 MCG/ACT nasal spray Place 1 spray into both nostrils daily as needed for allergies.    . fluticasone (FLONASE) 50 MCG/ACT nasal spray Place 2 sprays into both nostrils daily. 16 g 2  . metoprolol succinate (TOPROL-XL) 25 MG 24 hr tablet Take 1 tablet (25 mg total) by mouth daily. (Patient taking  differently: Take 25 mg by mouth daily. Additional if needed for increase pulse rate) 90 tablet 3  . Multiple Vitamins-Minerals (ONE-A-DAY MENS 50+ ADVANTAGE PO) Take 1 tablet by mouth daily.     . nitroGLYCERIN (NITROSTAT) 0.4 MG  SL tablet PLACE 1 TABLET UNDER THE TONGUE EVERY 5 MINUTES AS NEEDED FOR CHEST PAIN. 3 DOSES MAX (Patient taking differently: Place 0.4 mg under the tongue every 5 (five) minutes as needed for chest pain. 3 DOSES MAX) 25 tablet 4  . oxyCODONE-acetaminophen (PERCOCET/ROXICET) 5-325 MG tablet Take 1 tablet by mouth every 6 (six) hours as needed for moderate pain. 30 tablet 0  . prochlorperazine (COMPAZINE) 10 MG tablet Take 1 tablet (10 mg total) by mouth every 6 (six) hours as needed. 30 tablet 2  . ranolazine (RANEXA) 1000 MG SR tablet Take 1 tablet (1,000 mg total) by mouth 2 (two) times daily. 180 tablet 0  . Tiotropium Bromide-Olodaterol (STIOLTO RESPIMAT) 2.5-2.5 MCG/ACT AERS Inhale 2 puffs into the lungs daily. 2 g 0  . Tiotropium Bromide-Olodaterol (STIOLTO RESPIMAT) 2.5-2.5 MCG/ACT AERS Inhale 2 puffs into the lungs daily. 4 g 3   No current facility-administered medications for this visit.    REVIEW OF SYSTEMS:   Review of Systems  Constitutional: Negative for appetite change, chills, fatigue, fever and unexpected weight change.  HENT: Negative for mouth sores, nosebleeds, sore throat and trouble swallowing.   Eyes: Negative for eye problems and icterus.  Respiratory: Positive for cough, wheezing, and shortness of breath. Negative for  Hemoptysis.   Cardiovascular: Occasional chest discomfort from recent surgery. Negative for leg swelling.  Gastrointestinal: Negative for abdominal pain, constipation, diarrhea, nausea and vomiting.  Genitourinary: Negative for bladder incontinence, difficulty urinating, dysuria, frequency and hematuria.   Musculoskeletal: Negative for back pain, gait problem, neck pain and neck stiffness.  Skin: Negative for itching and rash.   Neurological: Negative for dizziness, extremity weakness, gait problem, headaches, light-headedness and seizures.  Hematological: Negative for adenopathy. Does not bruise/bleed easily.  Psychiatric/Behavioral: Negative for confusion, depression and sleep disturbance. The patient is not nervous/anxious.     PHYSICAL EXAMINATION:  Blood pressure 108/83, pulse (!) 105, temperature 98.8 F (37.1 C), temperature source Oral, resp. rate 19, weight 190 lb 8 oz (86.4 kg), SpO2 97 %.  ECOG PERFORMANCE STATUS: 1 - Symptomatic but completely ambulatory  Physical Exam  Constitutional: Oriented to person, place, and time and well-developed, well-nourished, and in no distress.   HENT:  Head: Normocephalic and atraumatic.  Mouth/Throat: Oropharynx is clear and moist. No oropharyngeal exudate.  Eyes: Conjunctivae are normal. Right eye exhibits no discharge. Left eye exhibits no discharge. No scleral icterus.  Neck: Normal range of motion. Neck supple.  Cardiovascular: Normal rate, regular rhythm, normal heart sounds and intact distal pulses.   Pulmonary/Chest: Effort normal and breath sounds normal except for mild wheezing bilaterally. No respiratory distress. No rales.  Abdominal: Soft. Bowel sounds are normal. Exhibits no distension and no mass. There is no tenderness.  Musculoskeletal: Normal range of motion. Exhibits no edema.  Lymphadenopathy:    No cervical adenopathy.  Neurological: Alert and oriented to person, place, and time. Exhibits normal muscle tone. Gait normal. Coordination normal.  Skin: Skin is warm and dry. No rash noted. Not diaphoretic. No erythema. No pallor.  Psychiatric: Mood, memory and judgment normal.  Vitals reviewed.  LABORATORY DATA: Lab Results  Component Value Date   WBC 9.7 01/14/2021   HGB 13.1 01/14/2021   HCT 39.6 01/14/2021   MCV 91.9 01/14/2021   PLT 338 01/14/2021      Chemistry      Component Value Date/Time   NA 142 01/14/2021 1307   K 4.4  01/14/2021 1307   CL 106 01/14/2021 1307   CO2  26 01/14/2021 1307   BUN 17 01/14/2021 1307   CREATININE 1.08 01/14/2021 1307   CREATININE 1.00 02/12/2016 1038      Component Value Date/Time   CALCIUM 9.6 01/14/2021 1307   ALKPHOS 48 12/26/2020 0015   AST 20 12/26/2020 0015   ALT 15 12/26/2020 0015   BILITOT 0.9 12/26/2020 0015   BILITOT <0.2 11/08/2017 6314       RADIOGRAPHIC STUDIES: DG Chest 1 View  Result Date: 01/06/2021 CLINICAL DATA:  Chest tube clamped EXAM: CHEST  1 VIEW COMPARISON:  Jan 06, 2021 study obtained earlier in the day FINDINGS: Chest tube position unchanged. Pneumothorax on the right may be slightly smaller than on earlier in the day. Subcutaneous air again noted on the right. There is mild scarring in the right base medially. There is equivocal right pleural effusion. No edema or airspace opacity. Heart size and pulmonary vascularity are normal. Status post coronary artery bypass grafting. Postoperative change in lower cervical spine. No bone lesions. IMPRESSION: Chest tube again noted on the right with right apical pneumothorax slightly smaller compared to earlier in the day. Equivocal right pleural effusion with mild scarring right base. No edema or airspace opacity. Stable cardiac silhouette. Electronically Signed   By: Lowella Grip III M.D.   On: 01/06/2021 14:02   DG Chest 2 View  Result Date: 01/01/2021 CLINICAL DATA:  Chest pain.  Recent right upper lobectomy EXAM: CHEST - 2 VIEW COMPARISON:  Dec 31, 2020 and Dec 30, 2020 FINDINGS: Chest tube is again noted on the right with essentially stable right apical pneumothorax. No tension component. Postoperative changes noted on the right medially, stable. No edema or airspace opacity. Heart is upper normal in size. The pulmonary vascularity is stable with postoperative change on the right and normal appearance on the left. Evidence of previous coronary artery bypass grafting. No adenopathy. Postoperative change at  cervicothoracic junction. There is aortic atherosclerosis. IMPRESSION: Chest tube on the right again noted with stable right apical pneumothorax. No tension component. Postoperative changes on the right. No edema or airspace opacity. Stable cardiac silhouette. Aortic Atherosclerosis (ICD10-I70.0). Electronically Signed   By: Lowella Grip III M.D.   On: 01/01/2021 08:39   DG Chest 2 View  Result Date: 12/22/2020 CLINICAL DATA:  Preop for robot assisted thoracoscopy. Hypertension. EXAM: CHEST - 2 VIEW COMPARISON:  12/12/2020 CT. FINDINGS: Prior median sternotomy.  Cervical spine fixation. Midline trachea. Normal heart size. Atherosclerosis in the transverse aorta. No pleural effusion or pneumothorax. Right upper lobe 2.2 cm pulmonary nodule again identified. No lobar consolidation or other acute superimposed process. IMPRESSION: Right upper lobe pulmonary nodule, as before. Aortic Atherosclerosis (ICD10-I70.0). Electronically Signed   By: Abigail Miyamoto M.D.   On: 12/22/2020 14:29   DG Chest Port 1 View  Result Date: 01/07/2021 CLINICAL DATA:  Chest tube removed EXAM: PORTABLE CHEST 1 VIEW COMPARISON:  01/06/2021 FINDINGS: Interval removal of right chest tube. Stable small right apical pneumothorax. Subcutaneous emphysema in the right chest wall is stable. No confluent opacities or effusions. Heart is normal size. IMPRESSION: Interval removal of right chest tube with stable small right apical pneumothorax. Electronically Signed   By: Rolm Baptise M.D.   On: 01/07/2021 08:46   DG Chest Port 1 View  Result Date: 01/06/2021 CLINICAL DATA:  Chest tube, pneumothorax EXAM: PORTABLE CHEST 1 VIEW COMPARISON:  01/05/2021 FINDINGS: Right chest tube remains in place, unchanged. Small right apical pneumothorax, stable since prior study. Prior CABG. No confluent opacities or effusions.  Heart is normal size. IMPRESSION: Small right apical pneumothorax right chest tube in place. No significant change since prior  study. Electronically Signed   By: Rolm Baptise M.D.   On: 01/06/2021 08:25   DG CHEST PORT 1 VIEW  Result Date: 01/05/2021 CLINICAL DATA:  Pneumothorax, chest tube EXAM: PORTABLE CHEST 1 VIEW COMPARISON:  01/04/2021 FINDINGS: Right apical pneumothorax remains present and without substantial change. Chest tube is present. No new findings. Stable heart size. Residual right chest wall emphysema. IMPRESSION: Stable small right apical pneumothorax with chest tube present. Electronically Signed   By: Macy Mis M.D.   On: 01/05/2021 08:14   DG CHEST PORT 1 VIEW  Result Date: 01/04/2021 CLINICAL DATA:  Follow-up pneumothorax and chest tube. EXAM: PORTABLE CHEST 1 VIEW COMPARISON:  01/03/2021 and older exams. FINDINGS: Interval decrease in the size of the right pneumothorax, now small, noted at the right apex. Stable right chest tube. Linear opacities at the medial right lung base are stable consistent with atelectasis. Left lung is hyperexpanded but clear. IMPRESSION: 1. Interval decrease in the size of the right pneumothorax, now small. 2. No other change.  Stable right chest tube. Electronically Signed   By: Lajean Manes M.D.   On: 01/04/2021 08:04   DG CHEST PORT 1 VIEW  Result Date: 01/03/2021 CLINICAL DATA:  Pneumothorax. EXAM: PORTABLE CHEST 1 VIEW COMPARISON:  Chest radiograph dated 01/02/2021. FINDINGS: The heart size is normal. Vascular calcifications are seen in the aortic arch. A moderate right pneumothorax appears similar to prior exam. There is no left pneumothorax and the left lung is clear. There is no pleural effusion on either side. A right-sided pleural pigtail catheter is unchanged in position. Spinal fixation hardware and median sternotomy wires are redemonstrated. IMPRESSION: Unchanged moderate right pneumothorax with right-sided pigtail pleural catheter in place. Electronically Signed   By: Zerita Boers M.D.   On: 01/03/2021 11:53   DG CHEST PORT 1 VIEW  Result Date:  01/02/2021 CLINICAL DATA:  Chest tube present EXAM: PORTABLE CHEST 1 VIEW COMPARISON:  Jan 01, 2021 FINDINGS: Right-sided chest tube present. Right apical region pneumothorax appears slightly smaller compared to 1 day prior. No tension component. There is subcutaneous air on the right. There is no edema or airspace opacity. The heart size and pulmonary vascularity normal. No adenopathy. There is aortic atherosclerosis. Postoperative changes bilaterally with endobronchial valves noted on the right. Postoperative change noted at cervicothoracic junction region. IMPRESSION: Right-sided pneumothorax slightly smaller compared to 1 day prior with chest tube on the right again noted. No tension component. Subcutaneous air again noted on the right. No edema or airspace opacity. Postoperative changes with endobronchial valves noted on the right. Stable cardiac silhouette. Aortic Atherosclerosis (ICD10-I70.0). Electronically Signed   By: Lowella Grip III M.D.   On: 01/02/2021 08:57   DG CHEST PORT 1 VIEW  Result Date: 01/01/2021 CLINICAL DATA:  Follow-up right pneumothorax EXAM: PORTABLE CHEST 1 VIEW COMPARISON:  Film from earlier in the same day. FINDINGS: Cardiac shadows within normal limits. Postsurgical changes are seen. Endobronchial valves are again noted on the right. Pigtail catheter is seen with a right-sided pneumothorax which has increased in the interval from the prior exam. IMPRESSION: Interval increase in right-sided pneumothorax with approximately 7 cm of excursion from the apex increased from 4 cm on the previous exam. These results will be called to the ordering clinician or representative by the Radiologist Assistant, and communication documented in the PACS or [employer]. Electronically Signed  By: Inez Catalina M.D.   On: 01/01/2021 14:03   DG CHEST PORT 1 VIEW  Result Date: 12/31/2020 CLINICAL DATA:  Status post lobectomy EXAM: PORTABLE CHEST 1 VIEW COMPARISON:  12/31/2020, 12/30/2020,  12/28/2020, CT 12/12/2020 FINDINGS: Post sternotomy changes. New right-sided chest drainage catheter with pigtail at the right apex. Small residual right apical pneumothorax, probably grossly unchanged allowing for non inclusion of the extreme apices. Postsurgical changes at the right hilus with interval radiopaque devices at the right hilus. Stable cardiomediastinal silhouette IMPRESSION: 1. Status post right upper lobectomy with placement of new right upper chest drainage catheter. Small residual right apical pneumothorax, probably not significantly changed. Electronically Signed   By: Donavan Foil M.D.   On: 12/31/2020 18:47   DG CHEST PORT 1 VIEW  Result Date: 12/31/2020 CLINICAL DATA:  Chest tube.  Pneumothorax. EXAM: PORTABLE CHEST 1 VIEW COMPARISON:  12/30/2020. FINDINGS: Right chest tube in stable position. Stable right apical pneumothorax. Prior CABG. Heart size normal. No focal infiltrate. Mild right base subsegmental atelectasis again noted. Prior cervical spine fusion. IMPRESSION: 1. Right chest tube in stable position. Stable right apical pneumothorax. 2.  Prior CABG.  Heart size normal. 3.  Mild right base subsegmental atelectasis again noted. Electronically Signed   By: Marcello Moores  Register   On: 12/31/2020 06:55   DG CHEST PORT 1 VIEW  Result Date: 12/30/2020 CLINICAL DATA:  Chest tube.  Pneumothorax.  Pulmonary nodule. EXAM: PORTABLE CHEST 1 VIEW COMPARISON:  12/28/2020. FINDINGS: Right chest tube in stable position. Stable right apical pneumothorax. Prior CABG. Heart size stable. Mild left base subsegmental atelectasis. No pleural effusion or pneumothorax. Prior cervicothoracic fusion. IMPRESSION: 1. Right chest tube in stable position. Stable right apical pneumothorax. 2.  Prior CABG.  Heart size stable. 3.  Mild right base subsegmental atelectasis. Electronically Signed   By: Marcello Moores  Register   On: 12/30/2020 07:57   DG CHEST PORT 1 VIEW  Result Date: 12/28/2020 CLINICAL DATA:  Right upper  lobectomy. EXAM: PORTABLE CHEST 1 VIEW COMPARISON:  Multiple chest x-rays since December 22, 2020. FINDINGS: The right chest tube is stable. There may be a pleural stripe seen at the right apex, possibly representing a pneumothorax. Lung markings are seen on both sides however suggesting the pneumothorax could either be anteriorly or posteriorly located. Recommend continued attention on follow-up. CT imaging could better delineate if clinically warranted. Air in the right chest wall stable. The heart, hila, and lungs are otherwise unchanged and unremarkable. IMPRESSION: 1. A right chest tube remains in place. 2. There may be and anterior or posterior pneumothorax at the apex with a possible pleural stripe. If this represents a pneumothorax, it measures approximately 4.2 cm at the apex. This may have been present yesterday in retrospect measuring 3 cm. Recommend attention on follow-up. CT imaging could better delineate if clinically warranted. These results will be called to the ordering clinician or representative by the Radiologist Assistant, and communication documented in the PACS or [employer]. Electronically Signed   By: Dorise Bullion III M.D   On: 12/28/2020 08:39   DG CHEST PORT 1 VIEW  Result Date: 12/27/2020 CLINICAL DATA:  Right upper lobectomy EXAM: PORTABLE CHEST 1 VIEW COMPARISON:  12/26/2020 FINDINGS: Right apically directed chest tube. No definite pneumothorax. Stable mild elevation of the right hemidiaphragm secondary to volume loss. No new consolidation or edema. Stable cardiomediastinal contours. Post CABG. IMPRESSION: Right apical chest tube.  No pneumothorax.  Stable lung aeration. Electronically Signed   By: Malachi Carl  Patel M.D.   On: 12/27/2020 12:37   DG CHEST PORT 1 VIEW  Result Date: 12/26/2020 CLINICAL DATA:  Chest tube in place for pneumothorax EXAM: PORTABLE CHEST 1 VIEW COMPARISON:  December 26, 2020 study obtained earlier in the day. FINDINGS: Chest tube position unchanged. No  evident pneumothorax. There is slight left base atelectasis. Lungs elsewhere are clear. Heart size and pulmonary vascularity are normal. Patient is status post internal mammary bypass grafting. There is aortic atherosclerosis. No adenopathy. There is postoperative change in the lower cervical region. IMPRESSION: Chest tube on the right, unchanged. No pneumothorax. Mild left base atelectasis. Lungs otherwise clear. Stable cardiac silhouette. Aortic Atherosclerosis (ICD10-I70.0). Electronically Signed   By: Lowella Grip III M.D.   On: 12/26/2020 13:22   DG CHEST PORT 1 VIEW  Result Date: 12/26/2020 CLINICAL DATA:  Chest tube in place for recent pneumothorax EXAM: PORTABLE CHEST 1 VIEW COMPARISON:  December 25, 2020 FINDINGS: Chest tube on the right unchanged in position. No demonstrable pneumothorax. There is mild bibasilar atelectasis. No edema or airspace opacity. Heart size and pulmonary vascular normal. Status post coronary artery bypass grafting. Postoperative change at cervicothoracic junction. IMPRESSION: Stable chest tube positioning on the right without evident pneumothorax. Bibasilar atelectasis. No edema or airspace opacity. Stable cardiac silhouette. Electronically Signed   By: Lowella Grip III M.D.   On: 12/26/2020 07:59   DG CHEST PORT 1 VIEW  Result Date: 12/25/2020 CLINICAL DATA:  History of pneumothorax. EXAM: PORTABLE CHEST 1 VIEW COMPARISON:  12/24/2020. FINDINGS: Right chest tube again noted in good anatomic position. No pneumothorax. Postsurgical changes right lung again noted. No focal infiltrate. No pleural effusion. Prior CABG. Heart size stable. Prior cervical spine fusion. IMPRESSION: Stable chest from prior exam. Right chest tube in stable position. No pneumothorax. Postsurgical changes again noted of the right lung. Electronically Signed   By: Marcello Moores  Register   On: 12/25/2020 08:18   DG Chest Port 1 View  Result Date: 12/24/2020 CLINICAL DATA:  Chest tube. EXAM: PORTABLE  CHEST 1 VIEW COMPARISON:  12/22/2020. FINDINGS: Right chest tube noted with tip in good anatomic position. No pneumothorax. Postsurgical changes right lung. Previously identified mass in the right lung no longer visualized. Prior CABG. Heart size normal. Prior cervical spine fusion. IMPRESSION: 1. Right chest tube noted with tip in good anatomic position. No pneumothorax. Postsurgical changes right lung. Previously identified right pulmonary mass no longer visualized. 2.  Prior CABG.  Heart size normal. Electronically Signed   By: Marcello Moores  Register   On: 12/24/2020 12:22   ECHOCARDIOGRAM COMPLETE  Result Date: 12/27/2020    ECHOCARDIOGRAM REPORT   Patient Name:   MALYK GIROUARD [facility] Date of Exam: 12/27/2020 Medical Rec #:  [id number]             Height:       69.0 in Accession #:    [id number]            Weight:       195.0 lb Date of Birth:  02/04/1956              BSA:          2.044 m Patient Age:    35 years              BP:           110/71 mmHg Patient Gender: M  HR:           78 bpm. Exam Location:  Inpatient Procedure: 2D Echo, Cardiac Doppler and Color Doppler Indications:    Atrial fibrillation  History:        Patient has prior history of Echocardiogram examinations, most                 recent 09/28/2018. Cardiomyopathy, CAD and Previous Myocardial                 Infarction, Prior CABG, Carotid Disease and Stroke,                 Arrythmias:Atrial Fibrillation; Risk Factors:Former Smoker,                 Hypertension and Dyslipidemia. Post op Right upper lobectomy,                 lung mass.  Sonographer:    Dustin Flock Referring Phys: [id number] [place]  1. Left ventricular ejection fraction, by estimation, is 55 to 60%. The left ventricle has normal function. The left ventricle has no regional wall motion abnormalities. Left ventricular diastolic parameters are consistent with Grade I diastolic dysfunction (impaired relaxation).  2. Right ventricular  systolic function is normal. The right ventricular size is normal.  3. The mitral valve is normal in structure. No evidence of mitral valve regurgitation. No evidence of mitral stenosis.  4. The aortic valve is normal in structure. Aortic valve regurgitation is not visualized. No aortic stenosis is present.  5. The inferior vena cava is normal in size with greater than 50% respiratory variability, suggesting right atrial pressure of 3 mmHg. Comparison(s): No significant change from prior study. FINDINGS  Left Ventricle: Left ventricular ejection fraction, by estimation, is 55 to 60%. The left ventricle has normal function. The left ventricle has no regional wall motion abnormalities. The left ventricular internal cavity size was normal in size. There is  no left ventricular hypertrophy. Left ventricular diastolic parameters are consistent with Grade I diastolic dysfunction (impaired relaxation). Right Ventricle: The right ventricular size is normal. No increase in right ventricular wall thickness. Right ventricular systolic function is normal. Left Atrium: Left atrial size was normal in size. Right Atrium: Right atrial size was normal in size. Pericardium: There is no evidence of pericardial effusion. Mitral Valve: The mitral valve is normal in structure. No evidence of mitral valve regurgitation. No evidence of mitral valve stenosis. Tricuspid Valve: The tricuspid valve is normal in structure. Tricuspid valve regurgitation is not demonstrated. No evidence of tricuspid stenosis. Aortic Valve: The aortic valve is normal in structure. Aortic valve regurgitation is not visualized. No aortic stenosis is present. Pulmonic Valve: The pulmonic valve was normal in structure. Pulmonic valve regurgitation is trivial. No evidence of pulmonic stenosis. Aorta: The aortic root is normal in size and structure. Venous: The inferior vena cava is normal in size with greater than 50% respiratory variability, suggesting right atrial  pressure of 3 mmHg. IAS/Shunts: No atrial level shunt detected by color flow Doppler.  LEFT VENTRICLE PLAX 2D LVIDd:         4.40 cm  Diastology LVIDs:         3.00 cm  LV e' medial:    8.38 cm/s LV PW:         1.10 cm  LV E/e' medial:  8.0 LV IVS:        1.10 cm  LV e' lateral:   8.81 cm/s  LVOT diam:     2.30 cm  LV E/e' lateral: 7.6 LV SV:         49 LV SV Index:   24 LVOT Area:     4.15 cm  RIGHT VENTRICLE RV Basal diam:  2.70 cm RV S prime:     4.57 cm/s TAPSE (M-mode): 2.2 cm LEFT ATRIUM             Index       RIGHT ATRIUM           Index LA diam:        3.10 cm 1.52 cm/m  RA Area:     12.50 cm LA Vol (A2C):   24.7 ml 12.08 ml/m RA Volume:   31.00 ml  15.17 ml/m LA Vol (A4C):   21.5 ml 10.52 ml/m LA Biplane Vol: 23.1 ml 11.30 ml/m  AORTIC VALVE LVOT Vmax:   76.40 cm/s LVOT Vmean:  49.800 cm/s LVOT VTI:    0.119 m  AORTA Ao Root diam: 2.90 cm MITRAL VALVE MV Area (PHT): 4.06 cm    SHUNTS MV Decel Time: 187 msec    Systemic VTI:  0.12 m MV E velocity: 67.30 cm/s  Systemic Diam: 2.30 cm MV A velocity: 77.10 cm/s MV E/A ratio:  0.87 Candee Furbish MD Electronically signed by Candee Furbish MD Signature Date/Time: 12/27/2020/2:47:05 PM    Final    DG C-ARM BRONCHOSCOPY  Result Date: 12/24/2020 C-ARM BRONCHOSCOPY: Fluoroscopy was utilized by the requesting physician.  No radiographic interpretation.    ASSESSMENT: This is a very pleasant 65 year old Caucasian male diagnosed with stage IIb (pT1c, N1, Mx) large cell neuroendocrine carcinoma.  He presented with a right upper lobe lung nodule and hilar lymphadenopathy.  His PD-L1 expression is 0%.  He was diagnosed in April 2022. His foundation one testing is pending and is expected to result on 01/23/21.  The patient recently underwent combined navigational bronchoscopy followed by robotic assisted thoracic surgery with a right upper lobectomy which was performed on 12/24/2020.    The patient was seen with Dr. [name] today.  Dr. [name] had a lengthy  discussion with the patient but his current condition and recommended treatment options. We will see if the patient has any mutations that we can use targetted therapy for . If negative,  Dr. [name] recommends the patient undergo 4 cycles of adjuvant chemotherapy with cisplatin 80 mg/m2 on day 1, etoposide 100 mg per metered squared on days 1, 2, and 3, IV every 3 weeks. The patient is interested in this option and he is expected to receive his first dose of treatment on ~02/03/21 which would be ~6 weeks after his surgery.   The adverse side effects of treatment were discussed including but not limited to alopecia, myelosuppression, nausea, vomiting, liver, and kidney dysfunction.  I will arrange for the patient to have a chemo education class prior to receiving his first cycle of treatment.  To complete staging work-up, the patient needs a brain MRI.  I have placed the order.  I sent a prescription for Compazine 10 mg p.o. every 6 hours as needed to the patient's pharmacy.  We will see the patient back for follow-up visit in ~2 weeks around 6/1 for evaluation and to finalize the plan as we expect to have his foundation one results by then.   For the activity that was noted near the GE junction, we will re-refer the patient to Dr. Benson Norway for consideration of upper endoscopy.  All questions were answered. The patient knows to call the clinic with any problems, questions or concerns. We can certainly see the patient much sooner if necessary.  Thank you so much for allowing me to participate in the care of [name]. I will continue to follow up the patient with you and assist in his care.   Disclaimer: This note was dictated with voice recognition software. Similar sounding words can inadvertently be transcribed and may not be corrected upon review.   Anita Mcadory L Laydon Martis Jan 14, 2021, 5:02 PM  ADDENDUM: Hematology/Oncology Attending: Had a face-to-face encounter with the  patient today.  I reviewed his record, scan, pathology report and recommended his care plan.  This is a very pleasant 65 years old white male with past medical history significant for STEMI, ischemic cardiomyopathy, carotid artery disease, COPD, GERD, hepatitis C, atrial tachycardia, hypertension, gout and anxiety.  The patient mentions that in February 2022 he presented to the emergency department complaining of shortness of breath and CT angiogram of the chest at that time showed a solid 1.7 cm right upper lobe pulmonary nodule suspicious for primary bronchogenic carcinoma.  He had a PET scan on November 27, 2020 and it showed hypermetabolic activity in the right upper lobe nodule as well as moderate hypermetabolic lymph node in the right hilum suspicious for potential involvement with carcinoma in the right hilar lymph node.  There was also some activity in the GE GE junction without mass.  He had upper endoscopy in the past that showed eosinophilic esophagitis.  He was followed by Dr. Earlean Shawl.  Then Dr. Benson Norway.  The patient was seen by Dr. Valeta Harms as well as Dr. Kipp Brood and he underwent navigation bronchoscopy followed by right VATS with right upper lobectomy and lymph node sampling on 12/24/2020.  The final pathology showed 2.4 cm poorly differentiated carcinoma initially thought to represent adenocarcinoma but with the immunohistochemical stains it was finally confirmed as large cell neuroendocrine carcinoma according to the case number ([id number]) there was also involvement of right hilar lymph node with malignancy. Surgery was complicated with air leak requiring endobronchial valve placement x2 under the care of Dr. Kipp Brood.  His tissue was sent for PD-L1 expression and this was reported to be negative. The patient was referred to me today for evaluation and recommendation regarding treatment of his condition. When seen today he is feeling fine except for fatigue and soreness on the right side of the chest  from the surgical procedure.  He continues to have mild shortness of breath and cough. I had a lengthy discussion with the patient and his wife today about his current disease stage, prognosis and treatment options. The patient has a stage IIb (T1c, N1, M0) non-small cell lung cancer, large cell neuroendocrine carcinoma status post right upper lobectomy with lymph node sampling on December 24, 2020. I had a lengthy discussion with the patient and his wife about the role of adjuvant systemic chemotherapy for patient with a stage IIb. I gave the patient the option of observation versus proceeding with adjuvant systemic chemotherapy which provide a 5-year survival benefit of around 10% on top of the baseline 5 years survival of 50% for patient with stage IIb. I also discussed with the patient the type of adjuvant treatment and because of the large cell neuroendocrine carcinoma histology, I may consider The patient for treatment with a small cell regimen rather than non-small cell regimen for his condition. I recommended for the patient treatment with adjuvant systemic chemotherapy  with cisplatin 60 Mg/M2 on day 1 and etoposide 120 Mg/M2 on days 1, 2 and 3 every 3 weeks. I discussed with the patient the adverse effect of this treatment including but not limited to alopecia, myelosuppression, nausea and vomiting, peripheral neuropathy, liver or renal dysfunction. The patient is interested in treatment and he is expected to start the first cycle of his treatment and the second week of June 2022 to give him more time to recover from the recent surgery and complication. We will also order MRI of the brain to rule out brain metastasis. We will send his tissue block to foundation 1 for molecular studies to identify any actionable mutation. The patient his wife agreed to the current plan. He will have a chemotherapy education class before the first dose of his treatment. He will come back for follow-up visit 1 week  before the start of his treatment for reevaluation. He was advised to call immediately if he has any other concerning symptoms in the interval. The total time spent in the appointment was 90 minutes. Disclaimer: This note was dictated with voice recognition software. Similar sounding words can inadvertently be transcribed and may be missed upon review. Eilleen Kempf, MD 01/14/21

## 2021-01-14 ENCOUNTER — Other Ambulatory Visit: Payer: Self-pay

## 2021-01-14 ENCOUNTER — Inpatient Hospital Stay: Payer: Medicare Other | Attending: Physician Assistant | Admitting: Physician Assistant

## 2021-01-14 ENCOUNTER — Encounter: Payer: Self-pay | Admitting: Physician Assistant

## 2021-01-14 ENCOUNTER — Inpatient Hospital Stay: Payer: Medicare Other

## 2021-01-14 ENCOUNTER — Other Ambulatory Visit: Payer: Self-pay | Admitting: Internal Medicine

## 2021-01-14 VITALS — BP 108/83 | HR 105 | Temp 98.8°F | Resp 19 | Wt 190.5 lb

## 2021-01-14 DIAGNOSIS — B1920 Unspecified viral hepatitis C without hepatic coma: Secondary | ICD-10-CM

## 2021-01-14 DIAGNOSIS — J449 Chronic obstructive pulmonary disease, unspecified: Secondary | ICD-10-CM

## 2021-01-14 DIAGNOSIS — I471 Supraventricular tachycardia: Secondary | ICD-10-CM

## 2021-01-14 DIAGNOSIS — I10 Essential (primary) hypertension: Secondary | ICD-10-CM

## 2021-01-14 DIAGNOSIS — C7A1 Malignant poorly differentiated neuroendocrine tumors: Secondary | ICD-10-CM | POA: Diagnosis present

## 2021-01-14 DIAGNOSIS — C3491 Malignant neoplasm of unspecified part of right bronchus or lung: Secondary | ICD-10-CM

## 2021-01-14 DIAGNOSIS — Z902 Acquired absence of lung [part of]: Secondary | ICD-10-CM

## 2021-01-14 DIAGNOSIS — C3490 Malignant neoplasm of unspecified part of unspecified bronchus or lung: Secondary | ICD-10-CM | POA: Insufficient documentation

## 2021-01-14 DIAGNOSIS — Z7189 Other specified counseling: Secondary | ICD-10-CM

## 2021-01-14 LAB — BASIC METABOLIC PANEL - CANCER CENTER ONLY
Anion gap: 10 (ref 5–15)
BUN: 17 mg/dL (ref 8–23)
CO2: 26 mmol/L (ref 22–32)
Calcium: 9.6 mg/dL (ref 8.9–10.3)
Chloride: 106 mmol/L (ref 98–111)
Creatinine: 1.08 mg/dL (ref 0.61–1.24)
GFR, Estimated: >60 mL/min (ref >60)
Glucose, Bld: 105 mg/dL — ABNORMAL HIGH (ref 70–99)
Potassium: 4.4 mmol/L (ref 3.5–5.1)
Sodium: 142 mmol/L (ref 135–145)

## 2021-01-14 LAB — CBC WITH DIFFERENTIAL (CANCER CENTER ONLY)
Abs Immature Granulocytes: 0.03 10*3/uL (ref 0.00–0.07)
Basophils Absolute: 0 10*3/uL (ref 0.0–0.1)
Basophils Relative: 0 %
Eosinophils Absolute: 0.5 10*3/uL (ref 0.0–0.5)
Eosinophils Relative: 5 %
HCT: 39.6 % (ref 39.0–52.0)
Hemoglobin: 13.1 g/dL (ref 13.0–17.0)
Immature Granulocytes: 0 %
Lymphocytes Relative: 20 %
Lymphs Abs: 2 10*3/uL (ref 0.7–4.0)
MCH: 30.4 pg (ref 26.0–34.0)
MCHC: 33.1 g/dL (ref 30.0–36.0)
MCV: 91.9 fL (ref 80.0–100.0)
Monocytes Absolute: 0.6 10*3/uL (ref 0.1–1.0)
Monocytes Relative: 6 %
Neutro Abs: 6.7 10*3/uL (ref 1.7–7.7)
Neutrophils Relative %: 69 %
Platelet Count: 338 10*3/uL (ref 150–400)
RBC: 4.31 MIL/uL (ref 4.22–5.81)
RDW: 12.9 % (ref 11.5–15.5)
WBC Count: 9.7 10*3/uL (ref 4.0–10.5)
nRBC: 0 % (ref 0.0–0.2)

## 2021-01-14 MED ORDER — PROCHLORPERAZINE MALEATE 10 MG PO TABS: 10.0000 mg | ORAL_TABLET | Freq: Four times a day (QID) | ORAL | 2 refills | Status: DC | PRN

## 2021-01-14 NOTE — Patient Instructions (Addendum)
-  The sample (biopsy) that they took of your tumor was consistent with Large Cell Neuroendocrine Carcinoma -We covered a lot of important information at your appointment today regarding what the treatment plan is moving forward. Here are the the main points that were discussed at your office visit with [name] today:  -The treatment that you will receive consists of Two chemotherapy drugs, called Cisplatin and Etoposide. -We are planning on starting your treatment next week on 02/03/21 but before your start your treatment, I would like you to attend a Chemotherapy Education Class. This involves having you sit down with one of our nurse educators. She will discuss with your one-on-one more details about your treatment as well as general information about resources here at the [facility].  - We will check your labs once a week for the first ~4 treatments just to make sure that important components of your blood are in an acceptable range  Medications:  -I have sent a few important medication prescriptions to your pharmacy.  -Compazine was sent to your pharmacy. This medication is for nausea. You may take this every 6 hours as needed if you feel nausous.    Side Effects:  -The adverse effect of this treatment including but not limited to alopecia (losing your hair), myelosuppression (drops in the blood counts), nausea and vomiting, peripheral neuropathy (numbness and tingling in the hands and feet), liver or renal dysfunction.   Referrals:  -I placed a referral to Dr. Benson Norway. Please let me know if you do not hear from their office in the next few weeks.   Imaging:  -I will place an order for a brain MRI just to make sure that there is nothing that spread to this area  Follow up:  -We will see you back for a follow up visit 1 week before we plan to start to finalize the treatment.

## 2021-01-15 ENCOUNTER — Other Ambulatory Visit: Payer: Self-pay | Admitting: Physician Assistant

## 2021-01-15 ENCOUNTER — Telehealth: Payer: Self-pay

## 2021-01-15 ENCOUNTER — Telehealth: Payer: Self-pay | Admitting: Physician Assistant

## 2021-01-15 MED ORDER — OXYCODONE-ACETAMINOPHEN 5-325 MG PO TABS: 1.0000 | ORAL_TABLET | Freq: Three times a day (TID) | ORAL | 0 refills | Status: DC | PRN

## 2021-01-15 NOTE — Telephone Encounter (Signed)
Release: [id number] Per Outgoing Referral Faxed medical records to [facility] @ fax # [phone]

## 2021-01-15 NOTE — Telephone Encounter (Signed)
Patient contacted the office requesting refill of pain medication, Percocet and asked if he was able to drive. He is s/p RATS/lobectomy with IBV placement with Dr. Kipp Brood 12/24/20. Patient was discharged from the hospital on 01/07/21. Per Ellwood Handler, PA, patient is to get one more refill and will be his last.  Patient has received pain medication from another physician before surgery. She sent in prescription to [facility] on [address] in [place], [place]. Patient aware and acknowledged receipt.

## 2021-01-21 ENCOUNTER — Telehealth: Payer: Self-pay | Admitting: *Deleted

## 2021-01-21 NOTE — Telephone Encounter (Signed)
[name] called the office today. States all of the symptoms he was experiencing the day before have completely resolved. Patient states he has experienced little to no pain today. Patient denies fever. Patient, however, c/o fatigue and tiredness despite rest. Patient states he woke up this morning after his usual 6 hrs of sleep and felt tired. Patient states he has been going to the office to work but is having to come home earlier in the day to nap or lie down. Asked patient if he has taken a covid test in which he states he does not feel like he has covid. Reassured patient that he has undergone major surgery in which he was in the hospital for two weeks. Advised patient to slowly rebuild stamina and energy by taking frequent walks throughout his neighborhood. Patient states he becomes easily winded with activity. Advised patient to continue using his incentive spirometer in which he states he is. Patient denies any SOB at rest. Patient thanked me for listening. Patient told to call back if he has further questions or concerns.

## 2021-01-21 NOTE — Telephone Encounter (Signed)
Patient contacted the office stating he was experiencing lower back and "kidney" pain as well as anterior lower rib pain. Patient reports he took his temperature which showed a low grade fever of 100.2. Per patient, he awoke in the middle of the night in a sweat. Patient reports rechecking his temperature in the morning in which he states it was normal. States his fever "broke" in the middle of the night. Patient reports no drainage, redness or warmth to his incision sites. States all of his incisions are healing well. Denies SOB. Patient reports being around his family over the weekend for his grandsons birthday party. Patient reports experiencing pain in his kidneys. Advised patient to follow up with his PCP regarding this pain. Patient also reports pain along his anterior lower ribs on the right side. Patient states the pain is localized to one section and does not radiate. At times it feels like a stabbing pain. Patient states he has been taking his pain medication but will sometimes take it more than every 8 hours. Advised patient to only take it every 8. Patient reports he has been taking Tylenol in between Oxy doses. Advised patient to rest and take medications as prescribed. Message sent to Dr. Kipp Brood to make him aware. Patient will be notified with further instructions if needed.

## 2021-01-23 ENCOUNTER — Encounter (HOSPITAL_COMMUNITY): Payer: Self-pay

## 2021-01-23 NOTE — Progress Notes (Signed)
[facility] OFFICE PROGRESS NOTE  [name], MD [place] [address]  DIAGNOSIS:  Stage IV (pT1c, N1, M1b) large cell neuroendocrine carcinoma.  He presented with a right upper lobe lung nodule and hilar lymphadenopathy.  He was diagnosed in April 2022. He also had a small metastatic brain lesion.   PDL1: 0%  PRIOR THERAPY: Robotic assisted thoracic surgery with a right upper lobectomy which was performed on 12/24/2020.    CURRENT THERAPY: Adjuvant chemotherapy with cisplatin 80 mg/m2 on day 1, etoposide 100 mg per metered squared on days 1, 2, and 3, IV every 3 weeks. First dose on 02/09/21.   INTERVAL HISTORY: [name] 65 y.o. male returns to the clinic today for a follow up visit. The patient was diagnosed with large cell neuroendocrine carcinoma.  He was last seen in clinic on 01/14/21. At that appointment, were waiting for the results of his molecular studies and to complete the staging work up process.  The patient did not have any actionable mutations and his PD-L1 expression was 0%.  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  A referral was placed to radiation oncology and he has an appointment with them regarding management of this later today.  On his initial staging PET scan, there was some focal activity in the GE junction without discernible mass.  There was some mild soft tissue fullness in the area as well.  The patient is scheduled for an EGD tomorrow under the care of Dr. Benson Norway to further evaluate this area.  Overall, the patient is feeling similar today.  He has a cough that "comes and goes.  He describes his cough as a dry cough.  He follows up with Dr. Kipp Brood in a week and a half or so.  He gained a couple pounds since his last appointment.  He denies any fever or chills.  Has had a few nights of night sweats.  He denies any nausea, vomiting, diarrhea, or constipation.  He denies any  headache or visual changes.  The patient is trying to be active and walking 1 to 1-1/2 miles 2-3 times a day.  The patient is here today for evaluation and for more detailed discussion about his current condition and recommended treatment options.   MEDICAL HISTORY: Past Medical History:  Diagnosis Date  . Anxiety   . Arthritis   . Basal cell carcinoma (BCC) of forehead   . CAD (coronary artery disease)    a. 10/2015 ant STEMI >> LHC with 3 v CAD; oLAD tx with POBA >> emergent CABG. b. Multiple evals since that time, early graft failure of SVG-RCA by cath 03/2016. c. 2/19 PCI/DES x1 to pRCA, normal EF.  . Carotid artery disease ([place])    a. 40-59% BICA 02/2018.  Marland Kitchen Depression   . Dyspnea   . Ectopic atrial tachycardia ([place])   . Esophageal reflux    eosinophil esophagitis  . Family history of adverse reaction to anesthesia    "sister has PONV" (06/21/2017)  . Former tobacco use   . Gout   . Hepatitis C    "treated and cured" (06/21/2017)  . High cholesterol   . History of kidney stones   . Hypertension   . Ischemic cardiomyopathy    a. EF 25-30% at intraop TEE 4/17  //  b. Limited Echo 5/17 - EF 45-50%, mild ant HK. c. EF 55-65% by cath 09/2017.  Marland Kitchen  Migraine    "3-4/yr" (06/21/2017)  . Myocardial infarction ([place]) 10/2015  . Palpitations   . Sinus bradycardia    a. HR dropping into 40s in 02/2016 -> BB reduced.  . Stroke ([place]) 10/2016   "small one; sometimes my memory/cognitive issues" (06/21/2017)  . Symptomatic hypotension    a. 02/2016 ER visit -> meds reduced.  . Syncope     ALLERGIES:  is allergic to prednisone, tetanus toxoids, wellbutrin [bupropion], indomethacin, morphine and related, other, scallops [shellfish allergy], and chantix [varenicline].  MEDICATIONS:  Current Outpatient Medications  Medication Sig Dispense Refill  . albuterol (VENTOLIN HFA) 108 (90 Base) MCG/ACT inhaler Inhale 2 puffs into the lungs every 6 (six) hours as needed for wheezing or shortness of  breath. 8 g 2  . amiodarone (PACERONE) 200 MG tablet Take 1 tablet (200 mg total) by mouth daily. (Patient taking differently: Take 200 mg by mouth in the morning.) 30 tablet 1  . amLODipine (NORVASC) 10 MG tablet TAKE 1 TABLET(10 MG) BY MOUTH DAILY (Patient taking differently: Take 10 mg by mouth in the morning.) 90 tablet 1  . aspirin EC 81 MG tablet Take 81 mg by mouth in the morning. Swallow whole.    Marland Kitchen atorvastatin (LIPITOR) 80 MG tablet TAKE 1 TABLET(80 MG) BY MOUTH DAILY (Patient taking differently: Take 80 mg by mouth in the morning.) 90 tablet 2  . chlordiazePOXIDE (LIBRIUM) 10 MG capsule Take 10-20 mg by mouth 3 (three) times daily as needed for anxiety (sleep).    . cloNIDine (CATAPRES) 0.1 MG tablet Take 0.1 mg by mouth at bedtime.    . clopidogrel (PLAVIX) 75 MG tablet TAKE 1 TABLET BY MOUTH EVERY DAY (Patient taking differently: Take 75 mg by mouth in the morning.) 90 tablet 1  . fluticasone (FLONASE) 50 MCG/ACT nasal spray Place 2 sprays into both nostrils daily. (Patient taking differently: Place 2 sprays into both nostrils daily as needed for allergies.) 16 g 2  . metoprolol succinate (TOPROL-XL) 25 MG 24 hr tablet Take 1 tablet (25 mg total) by mouth daily. (Patient taking differently: Take 25 mg by mouth in the morning. Additional if needed for increase pulse rate) 90 tablet 3  . Multiple Vitamin (MULTIVITAMIN WITH MINERALS) TABS tablet Take 1 tablet by mouth in the morning. Centrum    . nitroGLYCERIN (NITROSTAT) 0.4 MG SL tablet PLACE 1 TABLET UNDER THE TONGUE EVERY 5 MINUTES AS NEEDED FOR CHEST PAIN. 3 DOSES MAX (Patient taking differently: Place 0.4 mg under the tongue every 5 (five) minutes x 3 doses as needed for chest pain. 3 DOSES MAX) 25 tablet 4  . prochlorperazine (COMPAZINE) 10 MG tablet Take 1 tablet (10 mg total) by mouth every 6 (six) hours as needed. (Patient taking differently: Take 10 mg by mouth every 6 (six) hours as needed for nausea.) 30 tablet 2  . ranolazine  (RANEXA) 1000 MG SR tablet Take 1 tablet (1,000 mg total) by mouth 2 (two) times daily. 180 tablet 0  . Tiotropium Bromide-Olodaterol (STIOLTO RESPIMAT) 2.5-2.5 MCG/ACT AERS Inhale 2 puffs into the lungs daily. (Patient taking differently: Inhale 1 puff into the lungs in the morning and at bedtime.) 4 g 3   No current facility-administered medications for this visit.    SURGICAL HISTORY:  Past Surgical History:  Procedure Laterality Date  . [place] VITRECTOMY WITH 20 GAUGE MVR PORT  12/31/2020   Procedure: 25 GAUGE PARS PLANA VITRECTOMY WITH 20 GAUGE MVR PORT;  Surgeon: [name],  MD;  Location[facility];  Service: Thoracic;;  . ANTERIOR CERVICAL DECOMP/DISCECTOMY FUSION N/A 10/17/2018   Procedure: Anterior Cervical Decompression Fusion - Cervical seven -Thoracic one;  Surgeon: Consuella Lose, MD;  Location: [facility];  Service: Neurosurgery;  Laterality: N/A;  . BASAL CELL CARCINOMA EXCISION     "forehead  . BIOPSY  07/20/2019   Procedure: BIOPSY;  Surgeon: Carol Ada, MD;  Location: [facility];  Service: Endoscopy;;  . BRONCHIAL BIOPSY  12/24/2020   Procedure: BRONCHIAL BIOPSIES;  Surgeon: Garner Nash, DO;  Location: [facility];  Service: Pulmonary;;  . BRONCHIAL BRUSHINGS  12/24/2020   Procedure: BRONCHIAL BRUSHINGS;  Surgeon: Garner Nash, DO;  Location: [facility];  Service: Pulmonary;;  . BRONCHIAL NEEDLE ASPIRATION BIOPSY  12/24/2020   Procedure: BRONCHIAL NEEDLE ASPIRATION BIOPSIES;  Surgeon: Garner Nash, DO;  Location: [facility];  Service: Pulmonary;;  . CARDIAC CATHETERIZATION N/A 11/28/2015   Procedure: Left Heart Cath and Coronary Angiography;  Surgeon: [name], MD;  Location: [facility];  Service: Cardiovascular;  Laterality: N/A;  . CARDIAC CATHETERIZATION N/A 11/28/2015   Procedure: Coronary Balloon Angioplasty;  Surgeon: [name], MD;  Location: [facility];  Service: Cardiovascular;  Laterality:  N/A;  ostial LAD  . CARDIAC CATHETERIZATION N/A 11/28/2015   Procedure: Coronary/Graft Angiography;  Surgeon: [name], MD;  Location: [facility];  Service: Cardiovascular;  Laterality: N/A;  coronaries only   . CARDIAC CATHETERIZATION N/A 04/21/2016   Procedure: Left Heart Cath and Coronary Angiography;  Surgeon: Wellington Hampshire, MD;  Location: [facility];  Service: Cardiovascular;  Laterality: N/A;  . CARDIAC CATHETERIZATION N/A 06/14/2016   Procedure: Left Heart Cath and Cors/Grafts Angiography;  Surgeon: Lorretta Harp, MD;  Location: [facility];  Service: Cardiovascular;  Laterality: N/A;  . CARDIAC CATHETERIZATION N/A 09/08/2016   Procedure: Left Heart Cath and Cors/Grafts Angiography;  Surgeon: Wellington Hampshire, MD;  Location: [facility];  Service: Cardiovascular;  Laterality: N/A;  . CARDIAC CATHETERIZATION    . CORONARY ARTERY BYPASS GRAFT N/A 11/28/2015   Procedure: CORONARY ARTERY BYPASS GRAFTING (CABG) TIMES FIVE USING LEFT INTERNAL MAMMARY ARTERY AND RIGHT GREATER SAPHENOUS,VIEN HARVEATED BY ENDOVIEN, INTRAOPPRATIVE TEE;  Surgeon: Gaye Pollack, MD;  Location: [facility];  Service: Open Heart Surgery;  Laterality: N/A;  . CORONARY STENT INTERVENTION N/A 10/05/2017   Procedure: CORONARY STENT INTERVENTION;  Surgeon: [name], MD;  Location: [facility];  Service: Cardiovascular;  Laterality: N/A;  . ESOPHAGOGASTRODUODENOSCOPY (EGD) WITH PROPOFOL N/A 07/20/2019   Procedure: ESOPHAGOGASTRODUODENOSCOPY (EGD) WITH PROPOFOL;  Surgeon: Carol Ada, MD;  [facility];  Service: Endoscopy;  Laterality: N/A;  . FIDUCIAL MARKER PLACEMENT  12/24/2020   Procedure: FIDUCIAL DYE MARKER PLACEMENT;  Surgeon: Garner Nash, DO;  Location: [facility];  Service: Pulmonary;;  . HUMERUS SURGERY Right 1969   "tumor inside bone; filled it w/bone chips"  . INTERCOSTAL NERVE BLOCK Right 12/24/2020   Procedure: INTERCOSTAL NERVE BLOCK;   Surgeon: [name], MD;  Location: [facility];  Service: Thoracic;  Laterality: Right;  . LEFT HEART CATH AND CORS/GRAFTS ANGIOGRAPHY N/A 03/11/2017   Procedure: Left Heart Cath and Cors/Grafts Angiography;  Surgeon: Leonie Man, MD;  Location: [facility];  Service: Cardiovascular;  Laterality: N/A;  . LEFT HEART CATH AND CORS/GRAFTS ANGIOGRAPHY N/A 10/05/2017   Procedure: LEFT HEART CATH AND CORS/GRAFTS ANGIOGRAPHY;  Surgeon: [name],  MD;  Location: [facility];  Service: Cardiovascular;  Laterality: N/A;  . LEFT HEART CATH AND CORS/GRAFTS ANGIOGRAPHY N/A 04/11/2019   Procedure: LEFT HEART CATH AND CORS/GRAFTS ANGIOGRAPHY;  Surgeon: [name], MD;  Location: [facility];  Service: Cardiovascular;  Laterality: N/A;  . NODE DISSECTION Right 12/24/2020   Procedure: NODE DISSECTION;  Surgeon: [name], MD;  Location: [facility];  Service: Thoracic;  Laterality: Right;  . PERIPHERAL VASCULAR CATHETERIZATION N/A 06/14/2016   Procedure: Lower Extremity Angiography;  Surgeon: Lorretta Harp, MD;  Location: [facility];  Service: Cardiovascular;  Laterality: N/A;  . VIDEO BRONCHOSCOPY WITH ENDOBRONCHIAL NAVIGATION Right 12/24/2020   Procedure: VIDEO BRONCHOSCOPY WITH ENDOBRONCHIAL NAVIGATION;  Surgeon: Garner Nash, DO;  Location: [facility];  Service: Pulmonary;  Laterality: Right;  [place] Kitchen VIDEO BRONCHOSCOPY WITH ENDOBRONCHIAL ULTRASOUND N/A 12/24/2020   Procedure: VIDEO BRONCHOSCOPY WITH ENDOBRONCHIAL ULTRASOUND;  Surgeon: Garner Nash, DO;  Location: [facility];  Service: Pulmonary;  Laterality: N/A;  . VIDEO BRONCHOSCOPY WITH INSERTION OF INTERBRONCHIAL VALVE (IBV) N/A 12/31/2020   Procedure: VIDEO BRONCHOSCOPY WITH INSERTION OF INTERBRONCHIAL VALVE (IBV).VALVE IN CARTRIDGE 45m,9mm. CHEST TUBE PLACEMENT.;  Surgeon: LLajuana Matte MD;  [facility];  Service: Thoracic;  Laterality: N/A;    REVIEW OF SYSTEMS:   Review of  Systems  Constitutional: Negative for appetite change, chills, fatigue, fever and unexpected weight change.  HENT: Negative for mouth sores, nosebleeds, sore throat and trouble swallowing.   Eyes: Negative for eye problems and icterus.  Respiratory: Positive for dry cough and occasional shortness of breath. negative for  hemoptysis and wheezing.   Cardiovascular: Negative for chest pain and leg swelling.  Gastrointestinal: Negative for abdominal pain, constipation, diarrhea, nausea and vomiting.  Genitourinary: Negative for bladder incontinence, difficulty urinating, dysuria, frequency and hematuria.   Musculoskeletal: Negative for back pain, gait problem, neck pain and neck stiffness.  Skin: Negative for itching and rash.  Neurological: Negative for dizziness, extremity weakness, gait problem, headaches, light-headedness and seizures.  Hematological: Negative for adenopathy. Does not bruise/bleed easily.  Psychiatric/Behavioral: Negative for confusion, depression and sleep disturbance. The patient is not nervous/anxious.     PHYSICAL EXAMINATION:  Blood pressure 105/70, pulse 74, temperature (!) 97.5 F (36.4 C), temperature source Tympanic, resp. rate 19, height 5' 9"  (1.753 m), weight 197 lb 1.6 oz (89.4 kg), SpO2 98 %.  ECOG PERFORMANCE STATUS: 1 - Symptomatic but completely ambulatory  Physical Exam  Constitutional: Oriented to person, place, and time and well-developed, well-nourished, and in no distress.  HENT:  Head: Normocephalic and atraumatic.  Mouth/Throat: Oropharynx is clear and moist. No oropharyngeal exudate.  Eyes: Conjunctivae are normal. Right eye exhibits no discharge. Left eye exhibits no discharge. No scleral icterus.  Neck: Normal range of motion. Neck supple.  Cardiovascular: Normal rate, regular rhythm, normal heart sounds and intact distal pulses.   Pulmonary/Chest: Effort normal.  Quiet breath sounds in the right lower lung field. no respiratory distress. No  wheezes. No rales.  Abdominal: Soft. Bowel sounds are normal. Exhibits no distension and no mass. There is no tenderness.  Musculoskeletal: Normal range of motion. Exhibits no edema.  Lymphadenopathy:    No cervical adenopathy.  Neurological: Alert and oriented to person, place, and time. Exhibits normal muscle tone. Gait normal. Coordination normal.  Skin: Skin is warm and dry. No rash noted. Not diaphoretic. No erythema. No pallor.  Psychiatric: Mood, memory and judgment normal.  Vitals reviewed.  LABORATORY DATA: Lab  Results  Component Value Date   WBC 8.0 01/29/2021   HGB 12.4 (L) 01/29/2021   HCT 38.8 (L) 01/29/2021   MCV 94.2 01/29/2021   PLT 215 01/29/2021      Chemistry      Component Value Date/Time   NA 143 01/29/2021 0815   K 4.3 01/29/2021 0815   CL 108 01/29/2021 0815   CO2 24 01/29/2021 0815   BUN 20 01/29/2021 0815   CREATININE 1.16 01/29/2021 0815   CREATININE 1.00 02/12/2016 1038      Component Value Date/Time   CALCIUM 9.3 01/29/2021 0815   ALKPHOS 97 01/29/2021 0815   AST 14 (L) 01/29/2021 0815   ALT 19 01/29/2021 0815   BILITOT 0.4 01/29/2021 0815       RADIOGRAPHIC STUDIES:  DG Chest 1 View  Result Date: 01/06/2021 CLINICAL DATA:  Chest tube clamped EXAM: CHEST  1 VIEW COMPARISON:  Jan 06, 2021 study obtained earlier in the day FINDINGS: Chest tube position unchanged. Pneumothorax on the right may be slightly smaller than on earlier in the day. Subcutaneous air again noted on the right. There is mild scarring in the right base medially. There is equivocal right pleural effusion. No edema or airspace opacity. Heart size and pulmonary vascularity are normal. Status post coronary artery bypass grafting. Postoperative change in lower cervical spine. No bone lesions. IMPRESSION: Chest tube again noted on the right with right apical pneumothorax slightly smaller compared to earlier in the day. Equivocal right pleural effusion with mild scarring right base.  No edema or airspace opacity. Stable cardiac silhouette. Electronically Signed   By: Lowella Grip III M.D.   On: 01/06/2021 14:02   DG Chest 2 View  Result Date: 01/01/2021 CLINICAL DATA:  Chest pain.  Recent right upper lobectomy EXAM: CHEST - 2 VIEW COMPARISON:  Dec 31, 2020 and Dec 30, 2020 FINDINGS: Chest tube is again noted on the right with essentially stable right apical pneumothorax. No tension component. Postoperative changes noted on the right medially, stable. No edema or airspace opacity. Heart is upper normal in size. The pulmonary vascularity is stable with postoperative change on the right and normal appearance on the left. Evidence of previous coronary artery bypass grafting. No adenopathy. Postoperative change at cervicothoracic junction. There is aortic atherosclerosis. IMPRESSION: Chest tube on the right again noted with stable right apical pneumothorax. No tension component. Postoperative changes on the right. No edema or airspace opacity. Stable cardiac silhouette. Aortic Atherosclerosis (ICD10-I70.0). Electronically Signed   By: Lowella Grip III M.D.   On: 01/01/2021 08:39   MR Brain W Wo Contrast  Result Date: 01/27/2021 CLINICAL DATA:  High-grade neuroendocrine carcinoma. Assess for metastatic disease. EXAM: MRI HEAD WITHOUT AND WITH CONTRAST TECHNIQUE: Multiplanar, multiecho pulse sequences of the brain and surrounding structures were obtained without and with intravenous contrast. CONTRAST:  49mL GADAVIST GADOBUTROL 1 MMOL/ML IV SOLN COMPARISON:  02/23/2020 FINDINGS: Brain: Diffusion imaging does not show any acute or subacute infarction. Newly seen 6.5 mm ring-enhancing lesion in the left lateral cerebellum consistent with a small metastasis. Minimal adjacent edema. There is some susceptibility artifact associated with this metastasis likely indicating microhemorrhage. No second intracranial metastatic lesion. Minimal small vessel change affects the hemispheric white matter.  No large vessel territory infarction. No hydrocephalus or extra-axial collection. Vascular: Major vessels at the base of the brain show flow. Skull and upper cervical spine: Negative Sinuses/Orbits: Clear/normal Other: None IMPRESSION: 6.5 mm ring-enhancing lesion of the left lateral cerebellum with some hemosiderin/blood  products, consistent with a solitary metastasis. Minimal adjacent edema. No second lesion identified. Electronically Signed   By: Nelson Chimes M.D.   On: 01/27/2021 14:01   DG Chest Port 1 View  Result Date: 01/07/2021 CLINICAL DATA:  Chest tube removed EXAM: PORTABLE CHEST 1 VIEW COMPARISON:  01/06/2021 FINDINGS: Interval removal of right chest tube. Stable small right apical pneumothorax. Subcutaneous emphysema in the right chest wall is stable. No confluent opacities or effusions. Heart is normal size. IMPRESSION: Interval removal of right chest tube with stable small right apical pneumothorax. Electronically Signed   By: Rolm Baptise M.D.   On: 01/07/2021 08:46   DG Chest Port 1 View  Result Date: 01/06/2021 CLINICAL DATA:  Chest tube, pneumothorax EXAM: PORTABLE CHEST 1 VIEW COMPARISON:  01/05/2021 FINDINGS: Right chest tube remains in place, unchanged. Small right apical pneumothorax, stable since prior study. Prior CABG. No confluent opacities or effusions. Heart is normal size. IMPRESSION: Small right apical pneumothorax right chest tube in place. No significant change since prior study. Electronically Signed   By: Rolm Baptise M.D.   On: 01/06/2021 08:25   DG CHEST PORT 1 VIEW  Result Date: 01/05/2021 CLINICAL DATA:  Pneumothorax, chest tube EXAM: PORTABLE CHEST 1 VIEW COMPARISON:  01/04/2021 FINDINGS: Right apical pneumothorax remains present and without substantial change. Chest tube is present. No new findings. Stable heart size. Residual right chest wall emphysema. IMPRESSION: Stable small right apical pneumothorax with chest tube present. Electronically Signed   By: Macy Mis M.D.   On: 01/05/2021 08:14   DG CHEST PORT 1 VIEW  Result Date: 01/04/2021 CLINICAL DATA:  Follow-up pneumothorax and chest tube. EXAM: PORTABLE CHEST 1 VIEW COMPARISON:  01/03/2021 and older exams. FINDINGS: Interval decrease in the size of the right pneumothorax, now small, noted at the right apex. Stable right chest tube. Linear opacities at the medial right lung base are stable consistent with atelectasis. Left lung is hyperexpanded but clear. IMPRESSION: 1. Interval decrease in the size of the right pneumothorax, now small. 2. No other change.  Stable right chest tube. Electronically Signed   By: Lajean Manes M.D.   On: 01/04/2021 08:04   DG CHEST PORT 1 VIEW  Result Date: 01/03/2021 CLINICAL DATA:  Pneumothorax. EXAM: PORTABLE CHEST 1 VIEW COMPARISON:  Chest radiograph dated 01/02/2021. FINDINGS: The heart size is normal. Vascular calcifications are seen in the aortic arch. A moderate right pneumothorax appears similar to prior exam. There is no left pneumothorax and the left lung is clear. There is no pleural effusion on either side. A right-sided pleural pigtail catheter is unchanged in position. Spinal fixation hardware and median sternotomy wires are redemonstrated. IMPRESSION: Unchanged moderate right pneumothorax with right-sided pigtail pleural catheter in place. Electronically Signed   By: Zerita Boers M.D.   On: 01/03/2021 11:53   DG CHEST PORT 1 VIEW  Result Date: 01/02/2021 CLINICAL DATA:  Chest tube present EXAM: PORTABLE CHEST 1 VIEW COMPARISON:  Jan 01, 2021 FINDINGS: Right-sided chest tube present. Right apical region pneumothorax appears slightly smaller compared to 1 day prior. No tension component. There is subcutaneous air on the right. There is no edema or airspace opacity. The heart size and pulmonary vascularity normal. No adenopathy. There is aortic atherosclerosis. Postoperative changes bilaterally with endobronchial valves noted on the right. Postoperative change  noted at cervicothoracic junction region. IMPRESSION: Right-sided pneumothorax slightly smaller compared to 1 day prior with chest tube on the right again noted. No tension component. Subcutaneous air again  noted on the right. No edema or airspace opacity. Postoperative changes with endobronchial valves noted on the right. Stable cardiac silhouette. Aortic Atherosclerosis (ICD10-I70.0). Electronically Signed   By: Lowella Grip III M.D.   On: 01/02/2021 08:57   DG CHEST PORT 1 VIEW  Result Date: 01/01/2021 CLINICAL DATA:  Follow-up right pneumothorax EXAM: PORTABLE CHEST 1 VIEW COMPARISON:  Film from earlier in the same day. FINDINGS: Cardiac shadows within normal limits. Postsurgical changes are seen. Endobronchial valves are again noted on the right. Pigtail catheter is seen with a right-sided pneumothorax which has increased in the interval from the prior exam. IMPRESSION: Interval increase in right-sided pneumothorax with approximately 7 cm of excursion from the apex increased from 4 cm on the previous exam. These results will be called to the ordering clinician or representative by the Radiologist Assistant, and communication documented in the PACS or [employer]. Electronically Signed   By: Inez Catalina M.D.   On: 01/01/2021 14:03   DG CHEST PORT 1 VIEW  Result Date: 12/31/2020 CLINICAL DATA:  Status post lobectomy EXAM: PORTABLE CHEST 1 VIEW COMPARISON:  12/31/2020, 12/30/2020, 12/28/2020, CT 12/12/2020 FINDINGS: Post sternotomy changes. New right-sided chest drainage catheter with pigtail at the right apex. Small residual right apical pneumothorax, probably grossly unchanged allowing for non inclusion of the extreme apices. Postsurgical changes at the right hilus with interval radiopaque devices at the right hilus. Stable cardiomediastinal silhouette IMPRESSION: 1. Status post right upper lobectomy with placement of new right upper chest drainage catheter. Small residual right apical  pneumothorax, probably not significantly changed. Electronically Signed   By: Donavan Foil M.D.   On: 12/31/2020 18:47   DG CHEST PORT 1 VIEW  Result Date: 12/31/2020 CLINICAL DATA:  Chest tube.  Pneumothorax. EXAM: PORTABLE CHEST 1 VIEW COMPARISON:  12/30/2020. FINDINGS: Right chest tube in stable position. Stable right apical pneumothorax. Prior CABG. Heart size normal. No focal infiltrate. Mild right base subsegmental atelectasis again noted. Prior cervical spine fusion. IMPRESSION: 1. Right chest tube in stable position. Stable right apical pneumothorax. 2.  Prior CABG.  Heart size normal. 3.  Mild right base subsegmental atelectasis again noted. Electronically Signed   By: [name]  Register   On: 12/31/2020 06:55     ASSESSMENT/PLAN:  This is a very pleasant 65 year old Caucasian male diagnosed with stage IV (pT1c, N1, M1c) large cell neuroendocrine carcinoma.  He presented with a right upper lobe lung nodule and hilar lymphadenopathy.   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 His PD-L1 expression is 0%.  He was diagnosed in April 2022. His foundation one testing is negative for any actionable mutations.    The patient was seen with Dr. [name] today.  Dr. [name] had a lengthy discussion with the patient on his current condition and recommended treatment options.  Since the patient did not have any actionable mutation, Dr. [name] recommended 4-6 cycles of adjuvant chemotherapy with cisplatin 80 mg per metered squared, on day 1, etoposide 100 mg per metered squared on days 1, 2, and 3 IV every 3 weeks.  The patient is interested in this option and he is expected to receive his first dose of treatment on 02/09/2021.  I will arrange the patient to have a chemo education class prior to receiving his first cycle of treatment.  The patient is scheduled to meet with the chemo education nurse today.  The adverse side effects of treatment were discussed including but not limited  to alopecia, myelosuppression, nausea,  vomiting, liver, and kidney dysfunction.   We will see the patient back for follow-up visit in 3 weeks for evaluation in 1 week follow-up visit to manage any adverse side effects of treatment.  The patient will have his EGD performed tomorrow under the care of Dr. Benson Norway for the focal activity at the GE junction seen on CT and PET scan.  There is some mild soft tissue fullness in this area.   The patient will meet with radiation oncology later today to discuss the small metastatic brain lesion.  They will likely proceed with SRS.   The patient was advised to call immediately if he has any concerning symptoms in the interval. The patient voices understanding of current disease status and treatment options and is in agreement with the current care plan. All questions were answered. The patient knows to call the clinic with any problems, questions or concerns. We can certainly see the patient much sooner if necessary    Orders Placed This Encounter  Procedures  . CBC with Differential (Cancer Center Only)    Standing Status:   Standing    Number of Occurrences:   12    Standing Expiration Date:   01/29/2022  . CMP ([facility] only)    Standing Status:   Standing    Number of Occurrences:   12    Standing Expiration Date:   01/29/2022  . Magnesium    Standing Status:   Standing    Number of Occurrences:   12    Standing Expiration Date:   01/29/2022    Tobe Sos Kyung Muto, PA-C 01/29/21  ADDENDUM: Hematology/Oncology Attending: I had a face-to-face encounter with the patient today.  I reviewed his record, lab and scan and recommended his care plan.  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  He has no actionable mutation and PD-L1 expression was negative.  This was diagnosed in April 2022. The patient is status post right upper lobectomy under the care of Dr. Kipp Brood. Unfortunately staging work-up showed evidence for metastatic disease to the brain with solitary cerebellar lesion. I recommended for the patient to see radiation oncology for consideration of SRS to this lesion. He has no actionable mutation and I recommended for the patient at least 4 cycles of systemic chemotherapy with cisplatin 60 Mg/M2 on day 1 and etoposide 120 Mg/M2 on days 1, 2 and 3 every 3 weeks for at least 4 cycles and then we will reevaluate for any additional treatment. I discussed with the patient the adverse effect of this treatment including but not limited to alopecia, myelosuppression, nausea and vomiting, peripheral neuropathy, liver or renal dysfunction. The patient is in agreement with the current treatment plan and he is expected to start the first cycle of this treatment on 02/09/2021.  He will come back for follow-up visit 1 week after the treatment for evaluation and management of any adverse effect of his treatment. He will have a chemotherapy education class before the first dose of his treatment. The patient was advised to call immediately if he has any concerning symptoms in the interval.  Disclaimer: This note was dictated with voice recognition software. Similar sounding words can inadvertently be transcribed and may be missed upon review.  Eilleen Kempf, MD 01/29/21

## 2021-01-27 ENCOUNTER — Ambulatory Visit (HOSPITAL_COMMUNITY)
Admission: RE | Admit: 2021-01-27 | Discharge: 2021-01-27 | Disposition: A | Discharge status: Home / Self Care | Payer: Medicare Other | Source: Ambulatory Visit | Attending: Physician Assistant | Admitting: Physician Assistant

## 2021-01-27 ENCOUNTER — Other Ambulatory Visit: Payer: Self-pay | Admitting: Physician Assistant

## 2021-01-27 ENCOUNTER — Other Ambulatory Visit: Payer: Self-pay

## 2021-01-27 DIAGNOSIS — C7A1 Malignant poorly differentiated neuroendocrine tumors: Secondary | ICD-10-CM | POA: Diagnosis present

## 2021-01-27 DIAGNOSIS — C3491 Malignant neoplasm of unspecified part of right bronchus or lung: Secondary | ICD-10-CM

## 2021-01-27 MED ORDER — GADOBUTROL 1 MMOL/ML IV SOLN
8.0000 mL | Freq: Once | INTRAVENOUS | Status: AC | PRN
  Administered 2021-01-27: 8 mL via INTRAVENOUS

## 2021-01-27 NOTE — Progress Notes (Signed)
I called the patient regarding his brain MRI results today. He has a small 6.5 mm metastasis in the left lateral cerebellum. I have referred him to radiation oncology. He is aware.

## 2021-01-27 NOTE — Telephone Encounter (Signed)
Dr. Valeta Harms, please see mychart message sent by pt and advise:  To: [facility]    From: Mechel Schutter    Created: 01/27/2021 5:47 AM     *-*-*This message was handled on 01/27/2021 8:44 AM by Mehmet Scally P*-*-*  How long does it normally take to quit coughing, wheezing, and breathlessness after my lobectomy?  I'm doing my exercises and am walking about 1  - 1.25 miles a day.  Still not a lot of energy yet??  Thanks, [name]

## 2021-01-28 ENCOUNTER — Other Ambulatory Visit: Payer: Self-pay | Admitting: Gastroenterology

## 2021-01-28 NOTE — Progress Notes (Signed)
Primary Tumor: Right Upper Lobe Lung  Location/Histology of Brain Tumor: Left lateral cerebellum.  Patient presented for Lung cancer work up scans.   EGD 01/30/2021:  MRI Brain 01/27/2021: 6.5 mm ring-enhancing lesion of the left lateral cerebellum with some hemosiderin/blood products, consistent with a solitary metastasis. Minimal adjacent edema. No second lesion identified.  Pathology: Right upper lobe, lobectomy/lymph nodes 12/24/2020   Past or anticipated interventions, if any, surgery: Dr. Kipp Brood -Right Upper lobectomy 12/24/2020   Past or anticipated interventions, if any, per neurosurgery:   Past or anticipated interventions, if any, per medical oncology:  Dr. Thad Ranger 01/29/2021 -The patient was seen with Dr. [name] today.  Dr. [name] had a lengthy discussion with the patient on his current condition and recommended treatment options.  Since the patient did not have any actionable mutation, Dr. [name] recommended 4-6 cycles of adjuvant chemotherapy with cisplatin 80 mg per metered squared, on day 1, etoposide 100 mg per metered squared on days 1, 2, and 3 IV every 3 weeks.  The patient is interested in this option and he is expected to receive his first dose of treatment on 02/09/2021.   Dose of Decadron, if applicable:   Recent neurologic symptoms, if any:   Seizures: No  Headaches: No  Nausea: No  Dizziness/ataxia/Gait: Has occasional dizziness/lightheaded when he standing   Difficulty with hand coordination: No  Focal numbness/weakness: No  Visual deficits/changes: No  Confusion/Memory deficits: None noted.   SAFETY ISSUES:  Prior radiation? No  Pacemaker/ICD? No  Possible current pregnancy? n/a  Is the patient on methotrexate? No  Additional Complaints / other details:

## 2021-01-29 ENCOUNTER — Encounter: Payer: Self-pay | Admitting: Physician Assistant

## 2021-01-29 ENCOUNTER — Inpatient Hospital Stay: Payer: Medicare Other | Attending: Physician Assistant

## 2021-01-29 ENCOUNTER — Other Ambulatory Visit: Payer: Self-pay | Admitting: Radiation Therapy

## 2021-01-29 ENCOUNTER — Other Ambulatory Visit: Payer: Self-pay

## 2021-01-29 ENCOUNTER — Inpatient Hospital Stay: Payer: Medicare Other

## 2021-01-29 ENCOUNTER — Ambulatory Visit
Admission: RE | Admit: 2021-01-29 | Discharge: 2021-01-29 | Disposition: A | Discharge status: Home / Self Care | Payer: Medicare Other | Source: Ambulatory Visit | Attending: Radiation Oncology | Admitting: Radiation Oncology

## 2021-01-29 ENCOUNTER — Encounter (HOSPITAL_COMMUNITY): Payer: Self-pay | Admitting: Gastroenterology

## 2021-01-29 ENCOUNTER — Inpatient Hospital Stay (HOSPITAL_BASED_OUTPATIENT_CLINIC_OR_DEPARTMENT_OTHER): Payer: Medicare Other | Admitting: Physician Assistant

## 2021-01-29 VITALS — BP 105/70 | HR 74 | Temp 97.5°F | Resp 19 | Ht 69.0 in | Wt 197.1 lb

## 2021-01-29 VITALS — Ht 69.0 in | Wt 197.0 lb

## 2021-01-29 DIAGNOSIS — Z79899 Other long term (current) drug therapy: Secondary | ICD-10-CM

## 2021-01-29 DIAGNOSIS — C7B8 Other secondary neuroendocrine tumors: Secondary | ICD-10-CM

## 2021-01-29 DIAGNOSIS — Z85820 Personal history of malignant melanoma of skin: Secondary | ICD-10-CM | POA: Diagnosis not present

## 2021-01-29 DIAGNOSIS — Z902 Acquired absence of lung [part of]: Secondary | ICD-10-CM | POA: Diagnosis not present

## 2021-01-29 DIAGNOSIS — Z5189 Encounter for other specified aftercare: Secondary | ICD-10-CM | POA: Diagnosis not present

## 2021-01-29 DIAGNOSIS — C7A1 Malignant poorly differentiated neuroendocrine tumors: Secondary | ICD-10-CM | POA: Diagnosis present

## 2021-01-29 DIAGNOSIS — Z5111 Encounter for antineoplastic chemotherapy: Secondary | ICD-10-CM | POA: Insufficient documentation

## 2021-01-29 DIAGNOSIS — C3491 Malignant neoplasm of unspecified part of right bronchus or lung: Secondary | ICD-10-CM

## 2021-01-29 DIAGNOSIS — C7931 Secondary malignant neoplasm of brain: Secondary | ICD-10-CM

## 2021-01-29 DIAGNOSIS — Z7189 Other specified counseling: Secondary | ICD-10-CM

## 2021-01-29 LAB — CMP (CANCER CENTER ONLY)
ALT: 19 U/L (ref 0–44)
AST: 14 U/L — ABNORMAL LOW (ref 15–41)
Albumin: 3.5 g/dL (ref 3.5–5.0)
Alkaline Phosphatase: 97 U/L (ref 38–126)
Anion gap: 11 (ref 5–15)
BUN: 20 mg/dL (ref 8–23)
CO2: 24 mmol/L (ref 22–32)
Calcium: 9.3 mg/dL (ref 8.9–10.3)
Chloride: 108 mmol/L (ref 98–111)
Creatinine: 1.16 mg/dL (ref 0.61–1.24)
GFR, Estimated: >60 mL/min (ref >60)
Glucose, Bld: 109 mg/dL — ABNORMAL HIGH (ref 70–99)
Potassium: 4.3 mmol/L (ref 3.5–5.1)
Sodium: 143 mmol/L (ref 135–145)
Total Bilirubin: 0.4 mg/dL (ref 0.3–1.2)
Total Protein: 6.6 g/dL (ref 6.5–8.1)

## 2021-01-29 LAB — CBC WITH DIFFERENTIAL (CANCER CENTER ONLY)
Abs Immature Granulocytes: 0.05 10*3/uL (ref 0.00–0.07)
Basophils Absolute: 0 10*3/uL (ref 0.0–0.1)
Basophils Relative: 1 %
Eosinophils Absolute: 0.6 10*3/uL — ABNORMAL HIGH (ref 0.0–0.5)
Eosinophils Relative: 7 %
HCT: 38.8 % — ABNORMAL LOW (ref 39.0–52.0)
Hemoglobin: 12.4 g/dL — ABNORMAL LOW (ref 13.0–17.0)
Immature Granulocytes: 1 %
Lymphocytes Relative: 24 %
Lymphs Abs: 1.9 10*3/uL (ref 0.7–4.0)
MCH: 30.1 pg (ref 26.0–34.0)
MCHC: 32 g/dL (ref 30.0–36.0)
MCV: 94.2 fL (ref 80.0–100.0)
Monocytes Absolute: 0.7 10*3/uL (ref 0.1–1.0)
Monocytes Relative: 9 %
Neutro Abs: 4.7 10*3/uL (ref 1.7–7.7)
Neutrophils Relative %: 58 %
Platelet Count: 215 10*3/uL (ref 150–400)
RBC: 4.12 MIL/uL — ABNORMAL LOW (ref 4.22–5.81)
RDW: 12.8 % (ref 11.5–15.5)
WBC Count: 8 10*3/uL (ref 4.0–10.5)
nRBC: 0 % (ref 0.0–0.2)

## 2021-01-29 NOTE — Progress Notes (Signed)
Radiation Oncology         [phone] ________________________________  Initial Outpatient Consultation - Conducted via telephone due to current COVID-19 concerns for limiting patient exposure  I spoke with the patient to conduct this consult visit via telephone to spare the patient unnecessary potential exposure in the healthcare setting during the current COVID-19 pandemic. The patient was notified in advance and was offered a [place] meeting to allow for face to face communication but unfortunately reported that they did not have the appropriate resources/technology to support such a visit and instead preferred to proceed with a telephone consult.     Name: [name]        MRN: [id number]  Date of Service: 01/29/2021 DOB: May 10, 1956  SW:[id number], Annie Main, MD  Curt Bears, MD     REFERRING PHYSICIAN: Curt Bears, MD   DIAGNOSIS: The primary encounter diagnosis was 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[place]). A diagnosis of Secondary malignant neoplasm of brain [facility]) was also pertinent to this visit.   HISTORY OF PRESENT ILLNESS: [name] is a 65 y.o. male seen at the request of Dr. 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 The patient was diagnosed in April 2022 with his lung carcinoma at the time of undergoing right upper lobectomy. Final pathology showed nodal involvement of the hilar lymph node, and a primary tumor measuring 2.4 cm, margins were negative.  Given the findings he was offered adjuvant chemotherapy and is currently receiving cisplatin and etoposide given the neuroendocrine features.  He underwent MRI of the brain on 01/28/2020 which revealed an enhancing 6.5 mm lesion in the left lateral cerebellum consistent with metastasis.  Given these findings, he is seen today to discuss stereotactic radiosurgery.    PREVIOUS RADIATION THERAPY: No   PAST MEDICAL  HISTORY:  Past Medical History:  Diagnosis Date  . Anxiety   . Arthritis   . Basal cell carcinoma (BCC) of forehead   . CAD (coronary artery disease)    a. 10/2015 ant STEMI >> LHC with 3 v CAD; oLAD tx with POBA >> emergent CABG. b. Multiple evals since that time, early graft failure of SVG-RCA by cath 03/2016. c. 2/19 PCI/DES x1 to pRCA, normal EF.  . Carotid artery disease ([place])    a. 40-59% BICA 02/2018.  Marland Kitchen Depression   . Dyspnea   . Ectopic atrial tachycardia ([place])   . Esophageal reflux    eosinophil esophagitis  . Family history of adverse reaction to anesthesia    "sister has PONV" (06/21/2017)  . Former tobacco use   . Gout   . Hepatitis C    "treated and cured" (06/21/2017)  . High cholesterol   . History of kidney stones   . Hypertension   . Ischemic cardiomyopathy    a. EF 25-30% at intraop TEE 4/17  //  b. Limited Echo 5/17 - EF 45-50%, mild ant HK. c. EF 55-65% by cath 09/2017.  . Migraine    "3-4/yr" (06/21/2017)  . Myocardial infarction ([place]) 10/2015  . Palpitations   . Sinus bradycardia    a. HR dropping into 40s in 02/2016 -> BB reduced.  . Stroke ([place]) 10/2016   "small one; sometimes my memory/cognitive issues" (06/21/2017)  . Symptomatic hypotension    a. 02/2016 ER visit -> meds reduced.  . Syncope   . Wears dentures   . Wears glasses        PAST SURGICAL  HISTORY: Past Surgical History:  Procedure Laterality Date  . [place] VITRECTOMY WITH 20 GAUGE MVR PORT  12/31/2020   Procedure: 25 GAUGE PARS PLANA VITRECTOMY WITH 20 GAUGE MVR PORT;  Surgeon: [name], MD;  [facility];  Service: Thoracic;;  . ANTERIOR CERVICAL DECOMP/DISCECTOMY FUSION N/A 10/17/2018   Procedure: Anterior Cervical Decompression Fusion - Cervical seven -Thoracic one;  Surgeon: Consuella Lose, MD;  Location: [place];  Service: Neurosurgery;  Laterality: N/A;  . BASAL CELL CARCINOMA EXCISION     "forehead  . BIOPSY  07/20/2019   Procedure: BIOPSY;  Surgeon:  Carol Ada, MD;  [facility]: [facility];  Service: Endoscopy;;  . BRONCHIAL BIOPSY  12/24/2020   Procedure: BRONCHIAL BIOPSIES;  Surgeon: Garner Nash, DO;  Location: [place] [facility];  Service: Pulmonary;;  . BRONCHIAL BRUSHINGS  12/24/2020   Procedure: BRONCHIAL BRUSHINGS;  Surgeon: Garner Nash, DO;  Location: [place] [facility];  Service: Pulmonary;;  . BRONCHIAL NEEDLE ASPIRATION BIOPSY  12/24/2020   Procedure: BRONCHIAL NEEDLE ASPIRATION BIOPSIES;  Surgeon: Garner Nash, DO;  Location: [place];  Service: Pulmonary;;  . CARDIAC CATHETERIZATION N/A 11/28/2015   Procedure: Left Heart Cath and Coronary Angiography;  Surgeon: [name], MD;  Location: [place] CV [facility];  Service: Cardiovascular;  Laterality: N/A;  . CARDIAC CATHETERIZATION N/A 11/28/2015   Procedure: Coronary Balloon Angioplasty;  Surgeon: [name], MD;  Location: [place] [facility];  Service: Cardiovascular;  Laterality: N/A;  ostial LAD  . CARDIAC CATHETERIZATION N/A 11/28/2015   Procedure: Coronary/Graft Angiography;  Surgeon: [name], MD;  Location: [place] CV LAB;  Service: Cardiovascular;  Laterality: N/A;  coronaries only   . CARDIAC CATHETERIZATION N/A 04/21/2016   Procedure: Left Heart Cath and Coronary Angiography;  Surgeon: Wellington Hampshire, MD;  Location: [place] [facility] LAB;  Service: Cardiovascular;  Laterality: N/A;  . CARDIAC CATHETERIZATION N/A 06/14/2016   Procedure: Left Heart Cath and Cors/Grafts Angiography;  Surgeon: Lorretta Harp, MD;  Location: [place] [facility];  Service: Cardiovascular;  Laterality: N/A;  . CARDIAC CATHETERIZATION N/A 09/08/2016   Procedure: Left Heart Cath and Cors/Grafts Angiography;  Surgeon: Wellington Hampshire, MD;  Location: [place] CV LAB;  Service: Cardiovascular;  Laterality: N/A;  . CARDIAC CATHETERIZATION    . CORONARY ARTERY BYPASS GRAFT N/A 11/28/2015   Procedure: CORONARY ARTERY BYPASS GRAFTING (CABG) TIMES FIVE USING LEFT  INTERNAL MAMMARY ARTERY AND RIGHT GREATER SAPHENOUS,VIEN HARVEATED BY ENDOVIEN, INTRAOPPRATIVE TEE;  Surgeon: Gaye Pollack, MD;  Location: [place];  Service: Open Heart Surgery;  Laterality: N/A;  . CORONARY STENT INTERVENTION N/A 10/05/2017   Procedure: CORONARY STENT INTERVENTION;  Surgeon: [name], MD;  Location: [place] CV LAB;  Service: Cardiovascular;  Laterality: N/A;  . ESOPHAGOGASTRODUODENOSCOPY (EGD) WITH PROPOFOL N/A 07/20/2019   Procedure: ESOPHAGOGASTRODUODENOSCOPY (EGD) WITH PROPOFOL;  Surgeon: Carol Ada, MD;  [facility];  Service: Endoscopy;  Laterality: N/A;  . FIDUCIAL MARKER PLACEMENT  12/24/2020   Procedure: FIDUCIAL DYE MARKER PLACEMENT;  Surgeon: Garner Nash, DO;  Location: [place] [facility];  Service: Pulmonary;;  . HUMERUS SURGERY Right 1969   "tumor inside bone; filled it w/bone chips"  . INTERCOSTAL NERVE BLOCK Right 12/24/2020   Procedure: INTERCOSTAL NERVE BLOCK;  Surgeon: [name], MD;  Location: [place];  Service: Thoracic;  Laterality: Right;  . LEFT HEART CATH AND CORS/GRAFTS ANGIOGRAPHY N/A 03/11/2017   Procedure: Left Heart Cath and Cors/Grafts Angiography;  Surgeon:  Leonie Man, MD;  Location: [place] CV LAB;  Service: Cardiovascular;  Laterality: N/A;  . LEFT HEART CATH AND CORS/GRAFTS ANGIOGRAPHY N/A 10/05/2017   Procedure: LEFT HEART CATH AND CORS/GRAFTS ANGIOGRAPHY;  Surgeon: [name], MD;  Location: [place] [facility];  Service: Cardiovascular;  Laterality: N/A;  . LEFT HEART CATH AND CORS/GRAFTS ANGIOGRAPHY N/A 04/11/2019   Procedure: LEFT HEART CATH AND CORS/GRAFTS ANGIOGRAPHY;  Surgeon: [name], MD;  Location: [place] [facility];  Service: Cardiovascular;  Laterality: N/A;  . NODE DISSECTION Right 12/24/2020   Procedure: NODE DISSECTION;  Surgeon: [name], MD;  Location: [place];  Service: Thoracic;  Laterality: Right;  . PERIPHERAL VASCULAR CATHETERIZATION N/A 06/14/2016    Procedure: Lower Extremity Angiography;  Surgeon: Lorretta Harp, MD;  Location: [place] [facility];  Service: Cardiovascular;  Laterality: N/A;  . VIDEO BRONCHOSCOPY WITH ENDOBRONCHIAL NAVIGATION Right 12/24/2020   Procedure: VIDEO BRONCHOSCOPY WITH ENDOBRONCHIAL NAVIGATION;  Surgeon: Garner Nash, DO;  Location: [place];  Service: Pulmonary;  Laterality: Right;  Marland Kitchen VIDEO BRONCHOSCOPY WITH ENDOBRONCHIAL ULTRASOUND N/A 12/24/2020   Procedure: VIDEO BRONCHOSCOPY WITH ENDOBRONCHIAL ULTRASOUND;  Surgeon: Garner Nash, DO;  Location: [place];  Service: Pulmonary;  Laterality: N/A;  . VIDEO BRONCHOSCOPY WITH INSERTION OF INTERBRONCHIAL VALVE (IBV) N/A 12/31/2020   Procedure: VIDEO BRONCHOSCOPY WITH INSERTION OF INTERBRONCHIAL VALVE (IBV).VALVE IN CARTRIDGE 26mm,9mm. CHEST TUBE PLACEMENT.;  Surgeon: [name], MD;  [facility];  Service: Thoracic;  Laterality: N/A;     FAMILY HISTORY:  Family History  Problem Relation Age of Onset  . Lung cancer Mother   . Heart Problems Father   . Heart attack Father 35  . Stroke Father   . Heart failure Father   . Heart attack Maternal Grandmother   . Stroke Maternal Grandmother   . Heart attack Paternal Uncle   . Hypertension Brother   . Autoimmune disease Neg Hx      SOCIAL HISTORY:  reports that he quit smoking about 5 years ago. His smoking use included cigarettes. He has a 33.00 pack-year smoking history. He has never used smokeless tobacco. He reports current alcohol use. He reports that he does not use drugs.   ALLERGIES: Prednisone, Tetanus toxoids, Wellbutrin [bupropion], Indomethacin, Morphine and related, Other, Scallops [shellfish allergy], and Chantix [varenicline]   MEDICATIONS:  Current Outpatient Medications  Medication Sig Dispense Refill  . albuterol (VENTOLIN HFA) 108 (90 Base) MCG/ACT inhaler Inhale 2 puffs into the lungs every 6 (six) hours as needed for wheezing or shortness of breath. 8 g 2  .  amiodarone (PACERONE) 200 MG tablet Take 1 tablet (200 mg total) by mouth daily. (Patient taking differently: Take 200 mg by mouth in the morning.) 30 tablet 1  . amLODipine (NORVASC) 10 MG tablet TAKE 1 TABLET(10 MG) BY MOUTH DAILY (Patient taking differently: Take 10 mg by mouth in the morning.) 90 tablet 1  . aspirin EC 81 MG tablet Take 81 mg by mouth in the morning. Swallow whole.    Marland Kitchen atorvastatin (LIPITOR) 80 MG tablet TAKE 1 TABLET(80 MG) BY MOUTH DAILY (Patient taking differently: Take 80 mg by mouth in the morning.) 90 tablet 2  . chlordiazePOXIDE (LIBRIUM) 10 MG capsule Take 10-20 mg by mouth 3 (three) times daily as needed for anxiety (sleep).    . cloNIDine (CATAPRES) 0.1 MG tablet Take 0.1 mg by mouth at bedtime.    . clopidogrel (PLAVIX) 75 MG tablet TAKE 1 TABLET  BY MOUTH EVERY DAY (Patient taking differently: Take 75 mg by mouth in the morning.) 90 tablet 1  . fluticasone (FLONASE) 50 MCG/ACT nasal spray Place 2 sprays into both nostrils daily. (Patient taking differently: Place 2 sprays into both nostrils daily as needed for allergies.) 16 g 2  . metoprolol succinate (TOPROL-XL) 25 MG 24 hr tablet Take 1 tablet (25 mg total) by mouth daily. (Patient taking differently: Take 25 mg by mouth in the morning. Additional if needed for increase pulse rate) 90 tablet 3  . Multiple Vitamin (MULTIVITAMIN WITH MINERALS) TABS tablet Take 1 tablet by mouth in the morning. Centrum    . nitroGLYCERIN (NITROSTAT) 0.4 MG SL tablet PLACE 1 TABLET UNDER THE TONGUE EVERY 5 MINUTES AS NEEDED FOR CHEST PAIN. 3 DOSES MAX (Patient taking differently: Place 0.4 mg under the tongue every 5 (five) minutes x 3 doses as needed for chest pain. 3 DOSES MAX) 25 tablet 4  . prochlorperazine (COMPAZINE) 10 MG tablet Take 1 tablet (10 mg total) by mouth every 6 (six) hours as needed. (Patient taking differently: Take 10 mg by mouth every 6 (six) hours as needed for nausea.) 30 tablet 2  . ranolazine (RANEXA) 1000 MG SR  tablet Take 1 tablet (1,000 mg total) by mouth 2 (two) times daily. 180 tablet 0  . Tiotropium Bromide-Olodaterol (STIOLTO RESPIMAT) 2.5-2.5 MCG/ACT AERS Inhale 2 puffs into the lungs daily. (Patient taking differently: Inhale 1 puff into the lungs in the morning and at bedtime.) 4 g 3  . lidocaine-prilocaine (EMLA) cream Apply 1 application topically as needed. 30 g 2   No current facility-administered medications for this encounter.     REVIEW OF SYSTEMS: On review of systems, the patient reports that he is doing well overall.  He denies any headaches, nausea, visual or auditory disturbances.  He is not experiencing any difficulty with seizures or movement disturbances.  No other complaints are verbalized.    PHYSICAL EXAM:  Wt Readings from Last 3 Encounters:  01/29/21 193 lb (87.5 kg)  01/29/21 197 lb (89.4 kg)  01/29/21 197 lb 1.6 oz (89.4 kg)   Pain Assessment Pain Score: 3  Pain Loc: Chest (Soreness, recent lobectomy)/10  Unable to assess given encounter type.   ECOG = 0  0 - Asymptomatic (Fully active, able to carry on all predisease activities without restriction)  1 - Symptomatic but completely ambulatory (Restricted in physically strenuous activity but ambulatory and able to carry out work of a light or sedentary nature. For example, light housework, office work)  2 - Symptomatic, <50% in bed during the day (Ambulatory and capable of all self care but unable to carry out any work activities. Up and about more than 50% of waking hours)  3 - Symptomatic, >50% in bed, but not bedbound (Capable of only limited self-care, confined to bed or chair 50% or more of waking hours)  4 - Bedbound (Completely disabled. Cannot carry on any self-care. Totally confined to bed or chair)  5 - Death   Eustace Pen MM, Creech RH, Tormey DC, et al. [phone]). "Toxicity and response criteria of the [facility]". [place] Oncol. 5 (6): 649-55    LABORATORY DATA:  Lab  Results  Component Value Date   WBC 8.0 01/29/2021   HGB 12.4 (L) 01/29/2021   HCT 38.8 (L) 01/29/2021   MCV 94.2 01/29/2021   PLT 215 01/29/2021   Lab Results  Component Value Date   NA 143 01/29/2021  K 4.3 01/29/2021   CL 108 01/29/2021   CO2 24 01/29/2021   Lab Results  Component Value Date   ALT 19 01/29/2021   AST 14 (L) 01/29/2021   ALKPHOS 97 01/29/2021   BILITOT 0.4 01/29/2021      RADIOGRAPHY: DG Chest 1 View  Result Date: 01/06/2021 CLINICAL DATA:  Chest tube clamped EXAM: CHEST  1 VIEW COMPARISON:  Jan 06, 2021 study obtained earlier in the day FINDINGS: Chest tube position unchanged. Pneumothorax on the right may be slightly smaller than on earlier in the day. Subcutaneous air again noted on the right. There is mild scarring in the right base medially. There is equivocal right pleural effusion. No edema or airspace opacity. Heart size and pulmonary vascularity are normal. Status post coronary artery bypass grafting. Postoperative change in lower cervical spine. No bone lesions. IMPRESSION: Chest tube again noted on the right with right apical pneumothorax slightly smaller compared to earlier in the day. Equivocal right pleural effusion with mild scarring right base. No edema or airspace opacity. Stable cardiac silhouette. Electronically Signed   By: Lowella Grip III M.D.   On: 01/06/2021 14:02   DG Chest 2 View  Result Date: 01/01/2021 CLINICAL DATA:  Chest pain.  Recent right upper lobectomy EXAM: CHEST - 2 VIEW COMPARISON:  Dec 31, 2020 and Dec 30, 2020 FINDINGS: Chest tube is again noted on the right with essentially stable right apical pneumothorax. No tension component. Postoperative changes noted on the right medially, stable. No edema or airspace opacity. Heart is upper normal in size. The pulmonary vascularity is stable with postoperative change on the right and normal appearance on the left. Evidence of previous coronary artery bypass grafting. No adenopathy.  Postoperative change at cervicothoracic junction. There is aortic atherosclerosis. IMPRESSION: Chest tube on the right again noted with stable right apical pneumothorax. No tension component. Postoperative changes on the right. No edema or airspace opacity. Stable cardiac silhouette. Aortic Atherosclerosis (ICD10-I70.0). Electronically Signed   By: Lowella Grip III M.D.   On: 01/01/2021 08:39   MR Brain W Wo Contrast  Result Date: 01/27/2021 CLINICAL DATA:  High-grade neuroendocrine carcinoma. Assess for metastatic disease. EXAM: MRI HEAD WITHOUT AND WITH CONTRAST TECHNIQUE: Multiplanar, multiecho pulse sequences of the brain and surrounding structures were obtained without and with intravenous contrast. CONTRAST:  44mL GADAVIST GADOBUTROL 1 MMOL/ML IV SOLN COMPARISON:  02/23/2020 FINDINGS: Brain: Diffusion imaging does not show any acute or subacute infarction. Newly seen 6.5 mm ring-enhancing lesion in the left lateral cerebellum consistent with a small metastasis. Minimal adjacent edema. There is some susceptibility artifact associated with this metastasis likely indicating microhemorrhage. No second intracranial metastatic lesion. Minimal small vessel change affects the hemispheric white matter. No large vessel territory infarction. No hydrocephalus or extra-axial collection. Vascular: Major vessels at the base of the brain show flow. Skull and upper cervical spine: Negative Sinuses/Orbits: Clear/normal Other: None IMPRESSION: 6.5 mm ring-enhancing lesion of the left lateral cerebellum with some hemosiderin/blood products, consistent with a solitary metastasis. Minimal adjacent edema. No second lesion identified. Electronically Signed   By: Nelson Chimes M.D.   On: 01/27/2021 14:01   DG Chest Port 1 View  Result Date: 01/07/2021 CLINICAL DATA:  Chest tube removed EXAM: PORTABLE CHEST 1 VIEW COMPARISON:  01/06/2021 FINDINGS: Interval removal of right chest tube. Stable small right apical pneumothorax.  Subcutaneous emphysema in the right chest wall is stable. No confluent opacities or effusions. Heart is normal size. IMPRESSION: Interval removal of right chest  tube with stable small right apical pneumothorax. Electronically Signed   By: Rolm Baptise M.D.   On: 01/07/2021 08:46   DG Chest Port 1 View  Result Date: 01/06/2021 CLINICAL DATA:  Chest tube, pneumothorax EXAM: PORTABLE CHEST 1 VIEW COMPARISON:  01/05/2021 FINDINGS: Right chest tube remains in place, unchanged. Small right apical pneumothorax, stable since prior study. Prior CABG. No confluent opacities or effusions. Heart is normal size. IMPRESSION: Small right apical pneumothorax right chest tube in place. No significant change since prior study. Electronically Signed   By: Rolm Baptise M.D.   On: 01/06/2021 08:25   DG CHEST PORT 1 VIEW  Result Date: 01/05/2021 CLINICAL DATA:  Pneumothorax, chest tube EXAM: PORTABLE CHEST 1 VIEW COMPARISON:  01/04/2021 FINDINGS: Right apical pneumothorax remains present and without substantial change. Chest tube is present. No new findings. Stable heart size. Residual right chest wall emphysema. IMPRESSION: Stable small right apical pneumothorax with chest tube present. Electronically Signed   By: Macy Mis M.D.   On: 01/05/2021 08:14   DG CHEST PORT 1 VIEW  Result Date: 01/04/2021 CLINICAL DATA:  Follow-up pneumothorax and chest tube. EXAM: PORTABLE CHEST 1 VIEW COMPARISON:  01/03/2021 and older exams. FINDINGS: Interval decrease in the size of the right pneumothorax, now small, noted at the right apex. Stable right chest tube. Linear opacities at the medial right lung base are stable consistent with atelectasis. Left lung is hyperexpanded but clear. IMPRESSION: 1. Interval decrease in the size of the right pneumothorax, now small. 2. No other change.  Stable right chest tube. Electronically Signed   By: Lajean Manes M.D.   On: 01/04/2021 08:04   DG CHEST PORT 1 VIEW  Result Date: 01/03/2021 CLINICAL  DATA:  Pneumothorax. EXAM: PORTABLE CHEST 1 VIEW COMPARISON:  Chest radiograph dated 01/02/2021. FINDINGS: The heart size is normal. Vascular calcifications are seen in the aortic arch. A moderate right pneumothorax appears similar to prior exam. There is no left pneumothorax and the left lung is clear. There is no pleural effusion on either side. A right-sided pleural pigtail catheter is unchanged in position. Spinal fixation hardware and median sternotomy wires are redemonstrated. IMPRESSION: Unchanged moderate right pneumothorax with right-sided pigtail pleural catheter in place. Electronically Signed   By: Zerita Boers M.D.   On: 01/03/2021 11:53   DG CHEST PORT 1 VIEW  Result Date: 01/02/2021 CLINICAL DATA:  Chest tube present EXAM: PORTABLE CHEST 1 VIEW COMPARISON:  Jan 01, 2021 FINDINGS: Right-sided chest tube present. Right apical region pneumothorax appears slightly smaller compared to 1 day prior. No tension component. There is subcutaneous air on the right. There is no edema or airspace opacity. The heart size and pulmonary vascularity normal. No adenopathy. There is aortic atherosclerosis. Postoperative changes bilaterally with endobronchial valves noted on the right. Postoperative change noted at cervicothoracic junction region. IMPRESSION: Right-sided pneumothorax slightly smaller compared to 1 day prior with chest tube on the right again noted. No tension component. Subcutaneous air again noted on the right. No edema or airspace opacity. Postoperative changes with endobronchial valves noted on the right. Stable cardiac silhouette. Aortic Atherosclerosis (ICD10-I70.0). Electronically Signed   By: Lowella Grip III M.D.   On: 01/02/2021 08:57   DG CHEST PORT 1 VIEW  Result Date: 01/01/2021 CLINICAL DATA:  Follow-up right pneumothorax EXAM: PORTABLE CHEST 1 VIEW COMPARISON:  Film from earlier in the same day. FINDINGS: Cardiac shadows within normal limits. Postsurgical changes are seen.  Endobronchial valves are again noted on the right.  Pigtail catheter is seen with a right-sided pneumothorax which has increased in the interval from the prior exam. IMPRESSION: Interval increase in right-sided pneumothorax with approximately 7 cm of excursion from the apex increased from 4 cm on the previous exam. These results will be called to the ordering clinician or representative by the Radiologist Assistant, and communication documented in the PACS or [employer]. Electronically Signed   By: Inez Catalina M.D.   On: 01/01/2021 14:03       IMPRESSION/PLAN: 1. 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 Dr. Lisbeth Renshaw discusses the pathology findings and reviews the nature of metastatic lung cancer and the findings of brain disease. Dr. Lisbeth Renshaw reviews that his case will be presented in our multidisciplinary brain oncology conference but that he appears to be a good candidate for stereotactic radiosurgery [facility]). We reviewed the need for 3T MRI for extent of disease and treatment planning. We discussed the risks, benefits, short, and long term effects of radiotherapy, as well as the curative intent, and the patient is interested in proceeding. Dr. Lisbeth Renshaw discusses the delivery and logistics of radiotherapy and anticipates a course of single fraction radiosurgery. We discussed the role of neurosurgery and will coordinate evaluation as well. He will be contacted to coordinate simulation as well as treatment by our special procedures navigator.   Given current concerns for patient exposure during the COVID-19 pandemic, this encounter was conducted via telephone.  The patient has provided two factor identification and has given verbal consent for this type of encounter and has been advised to only accept a meeting of this type in a secure network environment. The time spent during this encounter was 60 minutes including preparation, discussion,  and coordination of the patient's care. The attendants for this meeting include Blenda Nicely, RN, Dr. Lisbeth Renshaw, Hayden Pedro  and [name].  During the encounter,  Blenda Nicely, RN, Dr. Lisbeth Renshaw, and Hayden Pedro were located at [facility] Radiation Oncology Department.  [name] was located at home.   The above documentation reflects my direct findings during this shared patient visit. Please see the separate note by Dr. Lisbeth Renshaw on this date for the remainder of the patient's plan of care.    Carola Rhine, [employer]   **Disclaimer: This note was dictated with voice recognition software. Similar sounding words can inadvertently be transcribed and this note may contain transcription errors which may not have been corrected upon publication of note.**

## 2021-01-29 NOTE — Patient Instructions (Addendum)
-  We covered a lot of important information at your appointment today regarding what the treatment plan is moving forward. Here are the the main points that were discussed at your office visit with [name] today:  -The treatment that you will receive consists of Two chemotherapy drugs, called Cisplatin/Carboplatin and Etoposide.  -We are planning on starting your treatment next week on 02/09/21 but before your start your treatment, I would like you to attend a Chemotherapy Education Class. This involves having you sit down with one of our nurse educators. She will discuss with your one-on-one more details about your treatment as well as general information about resources here at the [facility] treatment will be given for three consecutive days every 3 weeks.   We will check your labs once a week just to make sure that important components of your blood are in an acceptable range. We would do these three days in a row every 3 weeks for a total of somewhere between 4-6 times.  -We will get a CT scan after 2 treatments to check on the progress of treatment  Medications:  -I have sent a few important medication prescriptions to your pharmacy.  -Compazine was sent to your pharmacy. This medication is for nausea. You may take this every 6 hours as needed if you feel nausous. .   Side Effects:  -The adverse effect of this treatment including but not limited to alopecia (losing your hair), myelosuppression (drops in the blood counts), nausea and vomiting, peripheral neuropathy (numbness and tingling in the hands and feet), hearing deficit, liver or renal dysfunction.   Referrals:  -Continue to meet with radiation today so they can discuss treatment to the small area in the brain.   Follow up:  -We will see you back for a follow up visit in 2 weeks.

## 2021-01-30 ENCOUNTER — Ambulatory Visit (HOSPITAL_COMMUNITY): Payer: Medicare Other | Admitting: Anesthesiology

## 2021-01-30 ENCOUNTER — Other Ambulatory Visit: Payer: Self-pay | Admitting: Physician Assistant

## 2021-01-30 ENCOUNTER — Ambulatory Visit (HOSPITAL_COMMUNITY)
Admission: RE | Admit: 2021-01-30 | Discharge: 2021-01-30 | Disposition: A | Discharge status: Home / Self Care | Payer: Medicare Other | Attending: Gastroenterology | Admitting: Gastroenterology

## 2021-01-30 ENCOUNTER — Encounter (HOSPITAL_COMMUNITY)
Admission: RE | Disposition: A | Discharge status: Home / Self Care | Payer: Self-pay | Source: Home / Self Care | Attending: Gastroenterology

## 2021-01-30 ENCOUNTER — Encounter (HOSPITAL_COMMUNITY): Payer: Self-pay | Admitting: Gastroenterology

## 2021-01-30 DIAGNOSIS — Z951 Presence of aortocoronary bypass graft: Secondary | ICD-10-CM | POA: Insufficient documentation

## 2021-01-30 DIAGNOSIS — Z887 Allergy status to serum and vaccine status: Secondary | ICD-10-CM | POA: Insufficient documentation

## 2021-01-30 DIAGNOSIS — R0789 Other chest pain: Secondary | ICD-10-CM | POA: Diagnosis not present

## 2021-01-30 DIAGNOSIS — K2100 Gastro-esophageal reflux disease with esophagitis, without bleeding: Secondary | ICD-10-CM | POA: Diagnosis not present

## 2021-01-30 DIAGNOSIS — R933 Abnormal findings on diagnostic imaging of other parts of digestive tract: Secondary | ICD-10-CM | POA: Diagnosis present

## 2021-01-30 DIAGNOSIS — Z801 Family history of malignant neoplasm of trachea, bronchus and lung: Secondary | ICD-10-CM | POA: Diagnosis not present

## 2021-01-30 DIAGNOSIS — C3490 Malignant neoplasm of unspecified part of unspecified bronchus or lung: Secondary | ICD-10-CM | POA: Insufficient documentation

## 2021-01-30 DIAGNOSIS — Z87891 Personal history of nicotine dependence: Secondary | ICD-10-CM | POA: Insufficient documentation

## 2021-01-30 DIAGNOSIS — C7931 Secondary malignant neoplasm of brain: Secondary | ICD-10-CM | POA: Insufficient documentation

## 2021-01-30 DIAGNOSIS — Z85828 Personal history of other malignant neoplasm of skin: Secondary | ICD-10-CM | POA: Diagnosis not present

## 2021-01-30 DIAGNOSIS — Z955 Presence of coronary angioplasty implant and graft: Secondary | ICD-10-CM | POA: Diagnosis not present

## 2021-01-30 DIAGNOSIS — I252 Old myocardial infarction: Secondary | ICD-10-CM | POA: Insufficient documentation

## 2021-01-30 DIAGNOSIS — Z888 Allergy status to other drugs, medicaments and biological substances status: Secondary | ICD-10-CM | POA: Insufficient documentation

## 2021-01-30 DIAGNOSIS — I10 Essential (primary) hypertension: Secondary | ICD-10-CM | POA: Insufficient documentation

## 2021-01-30 DIAGNOSIS — Z885 Allergy status to narcotic agent status: Secondary | ICD-10-CM | POA: Diagnosis not present

## 2021-01-30 DIAGNOSIS — C3491 Malignant neoplasm of unspecified part of right bronchus or lung: Secondary | ICD-10-CM

## 2021-01-30 DIAGNOSIS — Z8673 Personal history of transient ischemic attack (TIA), and cerebral infarction without residual deficits: Secondary | ICD-10-CM | POA: Diagnosis not present

## 2021-01-30 HISTORY — PX: ESOPHAGOGASTRODUODENOSCOPY (EGD) WITH PROPOFOL: SHX5813

## 2021-01-30 HISTORY — DX: Presence of spectacles and contact lenses: Z97.3

## 2021-01-30 HISTORY — DX: Presence of dental prosthetic device (complete) (partial): Z97.2

## 2021-01-30 SURGERY — ESOPHAGOGASTRODUODENOSCOPY (EGD) WITH PROPOFOL
Anesthesia: Monitor Anesthesia Care

## 2021-01-30 MED ORDER — LIDOCAINE-PRILOCAINE 2.5-2.5 % EX CREA: 1.0000 "application " | TOPICAL_CREAM | CUTANEOUS | 2 refills | Status: DC | PRN

## 2021-01-30 MED ORDER — PROPOFOL 10 MG/ML IV BOLUS
INTRAVENOUS | Status: DC | PRN
  Administered 2021-01-30 (×2): 20 mg via INTRAVENOUS

## 2021-01-30 MED ORDER — LIDOCAINE 2% (20 MG/ML) 5 ML SYRINGE
INTRAMUSCULAR | Status: DC | PRN
  Administered 2021-01-30: 60 mg via INTRAVENOUS

## 2021-01-30 MED ORDER — LACTATED RINGERS IV SOLN: INTRAVENOUS | Status: DC | PRN

## 2021-01-30 MED ORDER — PROPOFOL 500 MG/50ML IV EMUL
INTRAVENOUS | Status: DC | PRN
  Administered 2021-01-30: 125 ug/kg/min via INTRAVENOUS

## 2021-01-30 MED ORDER — SODIUM CHLORIDE 0.9 % IV SOLN: INTRAVENOUS | Status: DC

## 2021-01-30 SURGICAL SUPPLY — 14 items

## 2021-01-30 NOTE — Anesthesia Postprocedure Evaluation (Signed)
Anesthesia Post Note  Patient: [name]  Procedure(s) Performed: ESOPHAGOGASTRODUODENOSCOPY (EGD) WITH PROPOFOL (N/A )     Patient location during evaluation: PACU Anesthesia Type: MAC Level of consciousness: awake and alert Pain management: pain level controlled Vital Signs Assessment: post-procedure vital signs reviewed and stable Respiratory status: spontaneous breathing, nonlabored ventilation, respiratory function stable and patient connected to nasal cannula oxygen Cardiovascular status: stable and blood pressure returned to baseline Postop Assessment: no apparent nausea or vomiting Anesthetic complications: no   No complications documented.  Last Vitals:  Vitals:   01/30/21 0950 01/30/21 1000  BP: 126/75 134/69  Pulse: 70 68  Resp: (!) 24 (!) 22  Temp:    SpO2: 100% 100%    Last Pain:  Vitals:   01/30/21 1000  TempSrc:   PainSc: 0-No pain                 Tiajuana Amass

## 2021-01-30 NOTE — Transfer of Care (Signed)
Immediate Anesthesia Transfer of Care Note  Patient: [name]  Procedure(s) Performed: ESOPHAGOGASTRODUODENOSCOPY (EGD) WITH PROPOFOL (N/A )  Patient Location: Endoscopy Unit  Anesthesia Type:MAC  Level of Consciousness: awake, alert  and oriented  Airway & Oxygen Therapy: Patient Spontanous Breathing and Patient connected to face mask oxygen  Post-op Assessment: Report given to RN and Post -op Vital signs reviewed and stable  Post vital signs: Reviewed and stable  Last Vitals:  Vitals Value Taken Time  BP 106/65 01/30/21 0940  Temp    Pulse 72 01/30/21 0940  Resp 21 01/30/21 0940  SpO2 100 % 01/30/21 0940  Vitals shown include unvalidated device data.  Last Pain:  Vitals:   01/30/21 0818  TempSrc: Temporal  PainSc: 0-No pain         Complications: No complications documented.

## 2021-01-30 NOTE — Anesthesia Preprocedure Evaluation (Signed)
Anesthesia Evaluation  Patient identified by MRN, date of birth, ID band Patient awake    Reviewed: Allergy & Precautions, NPO status , Patient's Chart, lab work & pertinent test results  Airway Mallampati: II  TM Distance: >3 FB Neck ROM: Full    Dental  (+) Dental Advisory Given   Pulmonary former smoker,  S/p  lobectomy   breath sounds clear to auscultation       Cardiovascular hypertension, Pt. on medications and Pt. on home beta blockers + angina + CAD, + Past MI, + Cardiac Stents and + CABG   Rhythm:Regular Rate:Normal     Neuro/Psych  Headaches,  Neuromuscular disease CVA    GI/Hepatic GERD  ,(+) Hepatitis -  Endo/Other  negative endocrine ROS  Renal/GU negative Renal ROS     Musculoskeletal  (+) Arthritis ,   Abdominal   Peds  Hematology negative hematology ROS (+)   Anesthesia Other Findings   Reproductive/Obstetrics                             Lab Results  Component Value Date   WBC 8.0 01/29/2021   HGB 12.4 (L) 01/29/2021   HCT 38.8 (L) 01/29/2021   MCV 94.2 01/29/2021   PLT 215 01/29/2021   Lab Results  Component Value Date   CREATININE 1.16 01/29/2021   BUN 20 01/29/2021   NA 143 01/29/2021   K 4.3 01/29/2021   CL 108 01/29/2021   CO2 24 01/29/2021    Anesthesia Physical  Anesthesia Plan  ASA: III  Anesthesia Plan: MAC   Post-op Pain Management:    Induction:   PONV Risk Score and Plan: 2 and Ondansetron, Treatment may vary due to age or medical condition and Propofol infusion  Airway Management Planned: Natural Airway and Nasal Cannula  Additional Equipment:   Intra-op Plan:   Post-operative Plan:   Informed Consent: I have reviewed the patients History and Physical, chart, labs and discussed the procedure including the risks, benefits and alternatives for the proposed anesthesia with the patient or authorized representative who has indicated  his/her understanding and acceptance.     Dental advisory given  Plan Discussed with: CRNA  Anesthesia Plan Comments: (PAT note by Karoline Caldwell, PA-C:  Follows with cardiology for history of CAD s/p anterior STEMI and emergent CABG in 2017.  In February 2019 he had a cath and PCI of the RCA due to narrowing of the graft to the distal RCA.  He has had multiple episodes of chest pain that been evaluated without intervention.  He had a cath in August 2020 for evaluation of chest pain, medical therapy recommended, no intervention.  He was again seen at the ED 10/24/2020 for chest pain.  Negative troponins.  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  He was last seen by Dr. Irish Lack 11/03/2020 and discussed that he would be seeing pulmonology and would likely need biopsy.  Per note, "Seeing pulmonary/preoperative cardiovascular eval- Dr. Valeta Harms. CT concerning for lung CA.  Family h/o lung CA.   OK to hold Plavix 5 days prior to biopsy."   Patient reported last dose Plavix 12/18/2020.  Hx of C7-T1 ACDF.  Preop labs reviewed, unremarkable.  EKG 12/22/20: Normal sinus rhythm. Rate 81. Possible Left atrial enlargement. Nonspecific ST abnormality. since last tracing no significant change  CHEST - 2 VIEW 12/22/2020: COMPARISON:  12/12/2020 CT.  FINDINGS: Prior median sternotomy.  Cervical spine  fixation.  Midline trachea. Normal heart size. Atherosclerosis in the transverse aorta. No pleural effusion or pneumothorax. Right upper lobe 2.2 cm pulmonary nodule again identified. No lobar consolidation or other acute superimposed process.  IMPRESSION: Right upper lobe pulmonary nodule, as before.  Aortic Atherosclerosis (ICD10-I70.0).  CT Chest 12/12/20: IMPRESSION: 1. Right upper lobe lung nodule, felt to be minimally enlarged from 10/24/2020. This remains suspicious for primary bronchogenic carcinoma. 2. No thoracic adenopathy. 3. Aortic atherosclerosis  (ICD10-I70.0) and emphysema (ICD10-J43.9).  PFT 11/13/2020: FVC-%Pred-Pre Latest Units: % 69 FEV1-%Pred-Pre Latest Units: % 57 FEV1FVC-%Pred-Pre Latest Units: % 82 TLC % pred Latest Units: % 94 RV % pred Latest Units: % 147 DLCO unc % pred Latest Units: % 90   Cath 04/11/19: Mid LAD lesion is 55% stenosed. Ost LAD to Prox LAD lesion is 50% stenosed. LIMA to LAD is patent. Ost Ramus lesion is 90% stenosed. Ramus lesion is 75% stenosed. Ost Cx lesion is 80% stenosed. Prox Cx to Mid Cx lesion is 50% stenosed. SVG to OM is patent. Dist RCA lesion is 50% stenosed. RPDA lesion is 40% stenosed. SVG to PDA is patent with proximal disease that is unchanged. Previously placed Ost RCA to Prox RCA drug eluting stent is widely patent. Graft to the diagonal was occluded. There was TIMI 3 flow in the small diagonal with proximal ectasia. The left ventricular systolic function is normal. LV end diastolic pressure is normal. LVEDP 11 mm Hg. The left ventricular ejection fraction is 50-55% by visual estimate. There is no aortic valve stenosis.   Continue medical therapy.    TTE 09/28/18: 1. The left ventricle has normal systolic function of 65-03%. The cavity  size is normal. There is normal left ventricular hypertrophy. Echo  evidence of normal diastolic filling patterns.  2. Normal left atrial size.  3. Normal right atrial size.  4. Normal tricuspid valve.  5. Tricuspid regurgitation is mild.  6. No atrial level shunt detected by color flow Doppler.  )        Anesthesia Quick Evaluation

## 2021-01-30 NOTE — H&P (Signed)
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 A PET scan also showed a highlighted area in his esophagus. 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 His EGD on 07/2019 was performed for complaints of noncardiac chest pain. The esophageal and gastric biopsies were not revealing, but he had an LA Grade A esophagitis.    Past Medical History:  Diagnosis Date  . Anxiety   . Arthritis   . Basal cell carcinoma (BCC) of forehead   . CAD (coronary artery disease)    a. 10/2015 ant STEMI >> LHC with 3 v CAD; oLAD tx with POBA >> emergent CABG. b. Multiple evals since that time, early graft failure of SVG-RCA by cath 03/2016. c. 2/19 PCI/DES x1 to pRCA, normal EF.  . Carotid artery disease ([place])    a. 40-59% BICA 02/2018.  Marland Kitchen Depression   . Dyspnea   . Ectopic atrial tachycardia ([place])   . Esophageal reflux    eosinophil esophagitis  . Family history of adverse reaction to anesthesia    "sister has PONV" (06/21/2017)  . Former tobacco use   . Gout   . Hepatitis C    "treated and cured" (06/21/2017)  . High cholesterol   . History of kidney stones   . Hypertension   . Ischemic cardiomyopathy    a. EF 25-30% at intraop TEE 4/17  //  b. Limited Echo 5/17 - EF 45-50%, mild ant HK. c. EF 55-65% by cath 09/2017.  . Migraine    "3-4/yr" (06/21/2017)  . Myocardial infarction ([place]) 10/2015  . Palpitations   . Sinus bradycardia    a. HR dropping into 40s in 02/2016 -> BB reduced.  . Stroke ([place]) 10/2016   "small one; sometimes my memory/cognitive issues" (06/21/2017)  . Symptomatic hypotension    a. 02/2016 ER visit -> meds reduced.  . Syncope   . Wears dentures   . Wears glasses     Past Surgical History:  Procedure Laterality Date  . [place] VITRECTOMY WITH 20 GAUGE MVR PORT  12/31/2020   Procedure: 25 GAUGE PARS PLANA VITRECTOMY WITH 20 GAUGE MVR PORT;  Surgeon: [name], MD;  Location: [facility];  Service: Thoracic;;  . ANTERIOR CERVICAL DECOMP/DISCECTOMY FUSION N/A 10/17/2018   Procedure: Anterior Cervical Decompression Fusion - Cervical seven -Thoracic one;  Surgeon: Consuella Lose, MD;  Location: [facility];  Service: Neurosurgery;  Laterality: N/A;  . BASAL CELL CARCINOMA EXCISION     "forehead  . BIOPSY  07/20/2019   Procedure: BIOPSY;  Surgeon: Carol Ada, MD;  Location: [facility];  Service: Endoscopy;;  . BRONCHIAL BIOPSY  12/24/2020   Procedure: BRONCHIAL BIOPSIES;  Surgeon: Garner Nash, DO;  Location: [facility];  Service: Pulmonary;;  . BRONCHIAL BRUSHINGS  12/24/2020   Procedure: BRONCHIAL BRUSHINGS;  Surgeon: Garner Nash, DO;  Location: [facility];  Service: Pulmonary;;  . BRONCHIAL NEEDLE ASPIRATION BIOPSY  12/24/2020   Procedure: BRONCHIAL NEEDLE ASPIRATION BIOPSIES;  Surgeon: Garner Nash, DO;  Location: [facility];  Service: Pulmonary;;  . CARDIAC CATHETERIZATION N/A 11/28/2015   Procedure: Left Heart Cath and Coronary Angiography;  Surgeon: [name], MD;  Location: [facility];  Service: Cardiovascular;  Laterality: N/A;  . CARDIAC CATHETERIZATION N/A 11/28/2015   Procedure: Coronary Balloon Angioplasty;  Surgeon: [name], MD;  Location: [facility];  Service: Cardiovascular;  Laterality: N/A;  ostial LAD  . CARDIAC CATHETERIZATION N/A 11/28/2015   Procedure: Coronary/Graft Angiography;  Surgeon: [name], MD;  Location: [facility];  Service: Cardiovascular;  Laterality: N/A;  coronaries only   . CARDIAC CATHETERIZATION N/A 04/21/2016   Procedure: Left Heart Cath and Coronary Angiography;  Surgeon: Wellington Hampshire, MD;  Location: [facility];  Service: Cardiovascular;  Laterality: N/A;  . CARDIAC CATHETERIZATION N/A 06/14/2016   Procedure: Left Heart Cath and Cors/Grafts Angiography;  Surgeon: Lorretta Harp, MD;  Location: [facility];  Service:  Cardiovascular;  Laterality: N/A;  . CARDIAC CATHETERIZATION N/A 09/08/2016   Procedure: Left Heart Cath and Cors/Grafts Angiography;  Surgeon: Wellington Hampshire, MD;  Location: [facility];  Service: Cardiovascular;  Laterality: N/A;  . CARDIAC CATHETERIZATION    . CORONARY ARTERY BYPASS GRAFT N/A 11/28/2015   Procedure: CORONARY ARTERY BYPASS GRAFTING (CABG) TIMES FIVE USING LEFT INTERNAL MAMMARY ARTERY AND RIGHT GREATER SAPHENOUS,VIEN HARVEATED BY ENDOVIEN, INTRAOPPRATIVE TEE;  Surgeon: Gaye Pollack, MD;  Location: [facility];  Service: Open Heart Surgery;  Laterality: N/A;  . CORONARY STENT INTERVENTION N/A 10/05/2017   Procedure: CORONARY STENT INTERVENTION;  Surgeon: [name], MD;  Location: [facility];  Service: Cardiovascular;  Laterality: N/A;  . ESOPHAGOGASTRODUODENOSCOPY (EGD) WITH PROPOFOL N/A 07/20/2019   Procedure: ESOPHAGOGASTRODUODENOSCOPY (EGD) WITH PROPOFOL;  Surgeon: Carol Ada, MD;  [facility];  Service: Endoscopy;  Laterality: N/A;  . FIDUCIAL MARKER PLACEMENT  12/24/2020   Procedure: FIDUCIAL DYE MARKER PLACEMENT;  Surgeon: Garner Nash, DO;  Location: [facility];  Service: Pulmonary;;  . HUMERUS SURGERY Right 1969   "tumor inside bone; filled it w/bone chips"  . INTERCOSTAL NERVE BLOCK Right 12/24/2020   Procedure: INTERCOSTAL NERVE BLOCK;  Surgeon: [name], MD;  Location: [facility];  Service: Thoracic;  Laterality: Right;  . LEFT HEART CATH AND CORS/GRAFTS ANGIOGRAPHY N/A 03/11/2017   Procedure: Left Heart Cath and Cors/Grafts Angiography;  Surgeon: Leonie Man, MD;  Location: [facility];  Service: Cardiovascular;  Laterality: N/A;  . LEFT HEART CATH AND CORS/GRAFTS ANGIOGRAPHY N/A 10/05/2017   Procedure: LEFT HEART CATH AND CORS/GRAFTS ANGIOGRAPHY;  Surgeon: [name], MD;  Location: [facility];  Service: Cardiovascular;  Laterality: N/A;  . LEFT HEART CATH AND CORS/GRAFTS ANGIOGRAPHY N/A  04/11/2019   Procedure: LEFT HEART CATH AND CORS/GRAFTS ANGIOGRAPHY;  Surgeon: [name], MD;  Location: [facility];  Service: Cardiovascular;  Laterality: N/A;  . NODE DISSECTION Right 12/24/2020   Procedure: NODE DISSECTION;  Surgeon: [name], MD;  Location: [facility];  Service: Thoracic;  Laterality: Right;  . PERIPHERAL VASCULAR CATHETERIZATION N/A 06/14/2016   Procedure: Lower Extremity Angiography;  Surgeon: Lorretta Harp, MD;  Location: [facility];  Service: Cardiovascular;  Laterality: N/A;  . VIDEO BRONCHOSCOPY WITH ENDOBRONCHIAL NAVIGATION Right 12/24/2020   Procedure: VIDEO BRONCHOSCOPY WITH ENDOBRONCHIAL NAVIGATION;  Surgeon: Garner Nash, DO;  Location: [facility];  Service: Pulmonary;  Laterality: Right;  Marland Kitchen VIDEO BRONCHOSCOPY WITH ENDOBRONCHIAL ULTRASOUND N/A 12/24/2020   Procedure: VIDEO BRONCHOSCOPY WITH ENDOBRONCHIAL ULTRASOUND;  Surgeon: Garner Nash, DO;  Location: [facility];  Service: Pulmonary;  Laterality: N/A;  . VIDEO BRONCHOSCOPY WITH INSERTION OF INTERBRONCHIAL VALVE (IBV) N/A 12/31/2020   Procedure: VIDEO BRONCHOSCOPY WITH INSERTION OF INTERBRONCHIAL VALVE (IBV).VALVE IN CARTRIDGE 98m,9mm. CHEST TUBE PLACEMENT.;  Surgeon: LLajuana Matte MD;  [facility];  Service: Thoracic;  Laterality: N/A;    Family History  Problem Relation Age of Onset  . Lung cancer Mother   . Heart Problems Father   . Heart attack Father 14  . Stroke Father   . Heart failure Father   . Heart attack Maternal Grandmother   . Stroke Maternal Grandmother   . Heart attack Paternal Uncle   . Hypertension Brother   . Autoimmune disease Neg Hx     Social History:  reports that he quit smoking about 5 years ago. His smoking use included cigarettes. He has a 33.00 pack-year smoking history. He has never used smokeless tobacco. He reports current alcohol use. He reports that he does not use drugs.  Allergies:  Allergies  Allergen Reactions   . Prednisone Other (See Comments)    States that this med makes him "crazy"  . Tetanus Toxoids Swelling and Other (See Comments)    Fever, Swelling of the arm   . Wellbutrin [Bupropion] Other (See Comments)    Crazy thoughts, nightmares  . Indomethacin Other (See Comments)    rectal bleeding  . Morphine And Related Hives and Itching    Redness at the injection site  . Other     Other reaction(s): Unknown  . Scallops [Shellfish Allergy] Nausea Only  . Chantix [Varenicline] Other (See Comments)    Dreams    Medications:  Scheduled:  Continuous: . sodium chloride      No results found for this or any previous visit (from the past 24 hour(s)).   No results found.  ROS:  As stated above in the HPI otherwise negative.  Blood pressure 125/76, pulse 80, temperature (!) 97.5 F (36.4 C), temperature source Temporal, resp. rate 17, height _0  (1.753 m), weight 87.5 kg, SpO2 98 %.    PE: Gen: NAD, Alert and Oriented HEENT:  /AT, EOMI Neck: Supple, no LAD Lungs: CTA Bilaterally CV: RRR without M/G/R ABD: Soft, NTND, +BS Ext: No C/C/E  Assessment/Plan: 1) Abnormal PET scan - EGD.  Kierra Jezewski D 01/30/2021, 8:32 AM

## 2021-01-30 NOTE — Discharge Instructions (Signed)
YOU HAD AN ENDOSCOPIC PROCEDURE TODAY: Refer to the procedure report and other information in the discharge instructions given to you for any specific questions about what was found during the examination. If this information does not answer your questions, please call [facility] at [phone] to clarify.  ° °YOU SHOULD EXPECT: Some feelings of bloating in the abdomen. Passage of more gas than usual. Walking can help get rid of the air that was put into your GI tract during the procedure and reduce the bloating. If you had a lower endoscopy (such as a colonoscopy or flexible sigmoidoscopy) you may notice spotting of blood in your stool or on the toilet paper. Some abdominal soreness may be present for a day or two, also. ° °DIET: Your first meal following the procedure should be a light meal and then it is ok to progress to your normal diet. A half-sandwich or bowl of soup is an example of a good first meal. Heavy or fried foods are harder to digest and may make you feel nauseous or bloated. Drink plenty of fluids but you should avoid alcoholic beverages for 24 hours. If you had an esophageal dilation, please see attached information for diet.  ° °ACTIVITY: Your care partner should take you home directly after the procedure. You should plan to take it easy, moving slowly for the rest of the day. You can resume normal activity the day after the procedure however YOU SHOULD NOT DRIVE, use power tools, machinery or perform tasks that involve climbing or major physical exertion for 24 hours (because of the sedation medicines used during the test).  ° °SYMPTOMS TO REPORT IMMEDIATELY: °A gastroenterologist can be reached at any hour. Please call [phone]  for any of the following symptoms:  °Following upper endoscopy (EGD, EUS, ERCP, esophageal dilation) °Vomiting of blood or coffee ground material  °New, significant abdominal pain  °New, significant chest pain or pain under the shoulder blades  °Painful or  persistently difficult swallowing  °New shortness of breath  °Black, tarry-looking or red, bloody stools ° °FOLLOW UP:  °If any biopsies were taken you will be contacted by phone or by letter within the next 1-3 weeks. Call [phone]  if you have not heard about the biopsies in 3 weeks.  °Please also call with any specific questions about appointments or follow up tests. ° °

## 2021-01-30 NOTE — Op Note (Signed)
[facility] Patient Name: [name] Procedure Date: 01/30/2021 MRN: [id number] Attending MD: Carol Ada , MD Date of Birth: October 16, 1955 CSN: [id number] Age: 65 Admit Type: Outpatient Procedure:                Upper GI endoscopy Indications:              Abnormal PET scan of the GI tract Providers:                Carol Ada, MD, Baird Cancer, RN, Tyrone Apple, Technician Referring MD:              Medicines:                Propofol per Anesthesia Complications:            No immediate complications. Estimated Blood Loss:     Estimated blood loss: none. Procedure:                Pre-Anesthesia Assessment:                           - Prior to the procedure, a History and Physical                            was performed, and patient medications and                            allergies were reviewed. The patient's tolerance of                            previous anesthesia was also reviewed. The risks                            and benefits of the procedure and the sedation                            options and risks were discussed with the patient.                            All questions were answered, and informed consent                            was obtained. Prior Anticoagulants: The patient has                            taken no previous anticoagulant or antiplatelet                            agents. ASA Grade Assessment: III - A patient with                            severe systemic disease. After reviewing the risks  and benefits, the patient was deemed in                            satisfactory condition to undergo the procedure.                           - Sedation was administered by an anesthesia                            professional. Deep sedation was attained.                           After obtaining informed consent, the endoscope was                            passed under direct  vision. Throughout the                            procedure, the patient's blood pressure, pulse, and                            oxygen saturations were monitored continuously. The                            GIF-H190 ([id number]) Olympus gastroscope was                            introduced through the mouth, and advanced to the                            second part of duodenum. The upper GI endoscopy was                            accomplished without difficulty. The patient                            tolerated the procedure well. Scope In: Scope Out: Findings:      LA Grade A (one or more mucosal breaks less than 5 mm, not extending       between tops of 2 mucosal folds) esophagitis with no bleeding was found       at the gastroesophageal junction.      The stomach was normal.      The examined duodenum was normal. Impression:               - LA Grade A reflux esophagitis with no bleeding.                           - Normal stomach.                           - Normal examined duodenum.                           - No specimens collected. Moderate Sedation:      Not Applicable - Patient  had care per Anesthesia. Recommendation:           - Patient has a contact number available for                            emergencies. The signs and symptoms of potential                            delayed complications were discussed with the                            patient. Return to normal activities tomorrow.                            Written discharge instructions were provided to the                            patient.                           - Resume previous diet.                           - Continue present medications.                           - Further treatment per Oncology. Procedure Code(s):        --- Professional ---                           [phone], Esophagogastroduodenoscopy, flexible,                            transoral; diagnostic, including collection of                             specimen(s) by brushing or washing, when performed                            (separate procedure) Diagnosis Code(s):        --- Professional ---                           K21.00, Gastro-esophageal reflux disease with                            esophagitis, without bleeding                           R93.3, Abnormal findings on diagnostic imaging of                            other parts of digestive tract CPT copyright 2019 [employer]. All rights reserved. The codes documented in this report are preliminary and upon coder review may  be revised to meet current compliance requirements. Carol Ada, MD Carol Ada, MD 01/30/2021 9:36:24 AM This report has been signed electronically. Number of Addenda: 0

## 2021-02-02 ENCOUNTER — Encounter (HOSPITAL_COMMUNITY): Payer: Self-pay | Admitting: Gastroenterology

## 2021-02-02 NOTE — Progress Notes (Signed)
Pharmacist Chemotherapy Monitoring - Initial Assessment    Anticipated start date: 02/09/21   Regimen:  . Are orders appropriate based on the patient's diagnosis, regimen, and cycle? Yes . Does the plan date match the patient's scheduled date? Yes . Is the sequencing of drugs appropriate? Yes . Are the premedications appropriate for the patient's regimen? Yes . Prior Authorization for treatment is: Approved o If applicable, is the correct biosimilar selected based on the patient's insurance? not applicable  Organ Function and Labs: Marland Kitchen Are dose adjustments needed based on the patient's renal function, hepatic function, or hematologic function? No . Are appropriate labs ordered prior to the start of patient's treatment? Yes . Other organ system assessment, if indicated: cisplatin: baseline audiogram . The following baseline labs, if indicated, have been ordered: cisplatin: K, Mg  Dose Assessment: . Are the drug doses appropriate? No . Are the following correct: o Drug concentrations Yes o IV fluid compatible with drug Yes o Administration routes Yes o Timing of therapy Yes . If applicable, does the patient have documented access for treatment and/or plans for port-a-cath placement? no . If applicable, have lifetime cumulative doses been properly documented and assessed? not applicable Lifetime Dose Tracking  No doses have been documented on this patient for the following tracked chemicals: Doxorubicin, Epirubicin, Idarubicin, Daunorubicin, Mitoxantrone, Bleomycin, Oxaliplatin, Carboplatin, Liposomal Doxorubicin  o   Toxicity Monitoring/Prevention: . The patient has the following take home antiemetics prescribed: Prochlorperazine . The patient has the following take home medications prescribed: N/A . Medication allergies and previous infusion related reactions, if applicable, have been reviewed and addressed. Yes . The patient's current medication list has been assessed for drug-drug  interactions with their chemotherapy regimen. no significant drug-drug interactions were identified on review.  Order Review: . Are the treatment plan orders signed? Yes . Is the patient scheduled to see a provider prior to their treatment? No  I verify that I have reviewed each item in the above checklist and answered each question accordingly.  Romualdo Bolk Chandler, [place], 02/02/2021  1:12 PM

## 2021-02-04 ENCOUNTER — Other Ambulatory Visit: Payer: Self-pay

## 2021-02-04 ENCOUNTER — Ambulatory Visit
Admission: RE | Admit: 2021-02-04 | Discharge: 2021-02-04 | Disposition: A | Discharge status: Home / Self Care | Payer: Medicare Other | Source: Ambulatory Visit | Attending: Radiation Oncology | Admitting: Radiation Oncology

## 2021-02-04 DIAGNOSIS — C7931 Secondary malignant neoplasm of brain: Secondary | ICD-10-CM

## 2021-02-04 MED ORDER — GADOBENATE DIMEGLUMINE 529 MG/ML IV SOLN
18.0000 mL | Freq: Once | INTRAVENOUS | Status: AC | PRN
  Administered 2021-02-04: 18 mL via INTRAVENOUS

## 2021-02-04 NOTE — Progress Notes (Signed)
Has armband been applied?  Yes  Does patient have an allergy to IV contrast dye?: No   Has patient ever received premedication for IV contrast dye?:  n/a  Does patient take metformin?: No  If patient does take metformin when was the last dose:   Date of lab work: 01/29/2021 BUN: 20 CR: 1.16 eGfr: >60  IV site: RAC  Has IV site been added to flowsheet?  Yes

## 2021-02-05 ENCOUNTER — Ambulatory Visit
Admission: RE | Admit: 2021-02-05 | Discharge: 2021-02-05 | Disposition: A | Discharge status: Home / Self Care | Payer: Medicare Other | Source: Ambulatory Visit | Attending: Radiation Oncology | Admitting: Radiation Oncology

## 2021-02-05 DIAGNOSIS — C7931 Secondary malignant neoplasm of brain: Secondary | ICD-10-CM | POA: Insufficient documentation

## 2021-02-05 DIAGNOSIS — Z510 Encounter for antineoplastic radiation therapy: Secondary | ICD-10-CM | POA: Insufficient documentation

## 2021-02-05 MED ORDER — SODIUM CHLORIDE 0.9% FLUSH
10.0000 mL | Freq: Once | INTRAVENOUS | Status: AC
Start: 2021-02-05 — End: 2021-02-05
  Administered 2021-02-05: 10 mL via INTRAVENOUS

## 2021-02-06 ENCOUNTER — Telehealth (INDEPENDENT_AMBULATORY_CARE_PROVIDER_SITE_OTHER): Payer: Self-pay | Admitting: Thoracic Surgery (Cardiothoracic Vascular Surgery)

## 2021-02-06 ENCOUNTER — Ambulatory Visit (HOSPITAL_COMMUNITY)
Admission: RE | Admit: 2021-02-06 | Discharge: 2021-02-06 | Disposition: A | Discharge status: Home / Self Care | Payer: Medicare Other | Source: Ambulatory Visit | Attending: Physician Assistant | Admitting: Physician Assistant

## 2021-02-06 ENCOUNTER — Other Ambulatory Visit: Payer: Self-pay

## 2021-02-06 DIAGNOSIS — C7931 Secondary malignant neoplasm of brain: Secondary | ICD-10-CM | POA: Diagnosis not present

## 2021-02-06 DIAGNOSIS — Z87891 Personal history of nicotine dependence: Secondary | ICD-10-CM | POA: Insufficient documentation

## 2021-02-06 DIAGNOSIS — Z888 Allergy status to other drugs, medicaments and biological substances status: Secondary | ICD-10-CM | POA: Insufficient documentation

## 2021-02-06 DIAGNOSIS — Z91013 Allergy to seafood: Secondary | ICD-10-CM | POA: Insufficient documentation

## 2021-02-06 DIAGNOSIS — Z79899 Other long term (current) drug therapy: Secondary | ICD-10-CM | POA: Insufficient documentation

## 2021-02-06 DIAGNOSIS — C3491 Malignant neoplasm of unspecified part of right bronchus or lung: Secondary | ICD-10-CM

## 2021-02-06 DIAGNOSIS — Z885 Allergy status to narcotic agent status: Secondary | ICD-10-CM | POA: Insufficient documentation

## 2021-02-06 DIAGNOSIS — Z7982 Long term (current) use of aspirin: Secondary | ICD-10-CM | POA: Insufficient documentation

## 2021-02-06 DIAGNOSIS — Z887 Allergy status to serum and vaccine status: Secondary | ICD-10-CM | POA: Insufficient documentation

## 2021-02-06 DIAGNOSIS — C3490 Malignant neoplasm of unspecified part of unspecified bronchus or lung: Secondary | ICD-10-CM | POA: Insufficient documentation

## 2021-02-06 DIAGNOSIS — Z7902 Long term (current) use of antithrombotics/antiplatelets: Secondary | ICD-10-CM | POA: Diagnosis not present

## 2021-02-06 DIAGNOSIS — Z09 Encounter for follow-up examination after completed treatment for conditions other than malignant neoplasm: Secondary | ICD-10-CM

## 2021-02-06 DIAGNOSIS — C779 Secondary and unspecified malignant neoplasm of lymph node, unspecified: Secondary | ICD-10-CM | POA: Diagnosis not present

## 2021-02-06 HISTORY — PX: IR IMAGING GUIDED PORT INSERTION: IMG5740

## 2021-02-06 MED ORDER — FENTANYL CITRATE (PF) 100 MCG/2ML IJ SOLN
INTRAMUSCULAR | Status: AC | PRN
  Administered 2021-02-06: 25 ug via INTRAVENOUS
  Administered 2021-02-06: 50 ug via INTRAVENOUS
  Administered 2021-02-06: 25 ug via INTRAVENOUS

## 2021-02-06 MED ORDER — MIDAZOLAM HCL 2 MG/2ML IJ SOLN
INTRAMUSCULAR | Status: AC
  Filled 2021-02-06: qty 2

## 2021-02-06 MED ORDER — LIDOCAINE-EPINEPHRINE 1 %-1:100000 IJ SOLN
INTRAMUSCULAR | Status: AC
  Filled 2021-02-06: qty 1

## 2021-02-06 MED ORDER — FENTANYL CITRATE (PF) 100 MCG/2ML IJ SOLN
INTRAMUSCULAR | Status: AC
  Filled 2021-02-06: qty 2

## 2021-02-06 MED ORDER — ACETAMINOPHEN 325 MG PO TABS
ORAL_TABLET | ORAL | Status: AC
  Filled 2021-02-06: qty 2

## 2021-02-06 MED ORDER — ACETAMINOPHEN 325 MG PO TABS
650.0000 mg | ORAL_TABLET | Freq: Once | ORAL | Status: AC
  Administered 2021-02-06: 650 mg via ORAL

## 2021-02-06 MED ORDER — MIDAZOLAM HCL 2 MG/2ML IJ SOLN
INTRAMUSCULAR | Status: AC | PRN
  Administered 2021-02-06 (×2): 1 mg via INTRAVENOUS

## 2021-02-06 MED ORDER — HEPARIN SOD (PORK) LOCK FLUSH 100 UNIT/ML IV SOLN
INTRAVENOUS | Status: AC | PRN
  Administered 2021-02-06: 500 [IU] via INTRAVENOUS

## 2021-02-06 MED ORDER — HEPARIN SOD (PORK) LOCK FLUSH 100 UNIT/ML IV SOLN
INTRAVENOUS | Status: AC
  Filled 2021-02-06: qty 5

## 2021-02-06 NOTE — Progress Notes (Signed)
     [address]       [place] [address]             [phone]       Patient: Home Provider: Office Consent for Telemedicine visit obtained.  Today's visit was completed via a real-time telehealth (see specific modality noted below). The patient/authorized person provided oral consent at the time of the visit to engage in a telemedicine encounter with the present provider at [facility]. The patient/authorized person was informed of the potential benefits, limitations, and risks of telemedicine. The patient/authorized person expressed understanding that the laws that protect confidentiality also apply to telemedicine. The patient/authorized person acknowledged understanding that telemedicine does not provide emergency services and that he or she would need to call 911 or proceed to the nearest hospital for help if such a need arose.   Total time spent in the clinical discussion 10 minutes.  Telehealth Modality: Phone visit (audio only)  I had a telephone visit with [name].  He recently had his port placed and had cerebral mapping due to to metastatic lesions found on MRI.  From a respiratory standpoint he is doing well.  He is walking about a mile and a half every day without significant shortness of breath unless he exerts himself.  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

## 2021-02-06 NOTE — Procedures (Signed)
Interventional Radiology Procedure Note  Procedure: Placement of a right IJ approach single lumen PowerPort.  Tip is positioned at the superior cavoatrial junction and catheter is ready for immediate use.   Complications: No immediate  EBL: None  Recommendations:  - Ok to shower tomorrow - Do not submerge for 7 days - Routine line care    Signed,  Mahum Betten K. Montanna Mcbain, MD   

## 2021-02-06 NOTE — Consult Note (Signed)
Chief Complaint: Patient was seen in consultation today for Port-A-Cath placement  Referring Physician(s): Heilingoetter,Cassandra L  Supervising Physician: Jacqulynn Cadet  Patient Status: [facility] - Out-pt  History of Present Illness: [name] is a 65 y.o. male , ex-smoker, with history of recently diagnosed stage IV non-small cell lung cancer, large cell neuroendocrine carcinoma who presented with a right upper lobe lung nodule in addition to right hilar adenopathy and solitary metastatic brain lesion in the left cerebellum.  He is status post right upper lobectomy on 12/24/20. He presents today for port a cath placement for chemotherapy. Additional med hx as below.   Past Medical History:  Diagnosis Date   Anxiety    Arthritis    Basal cell carcinoma (BCC) of forehead    CAD (coronary artery disease)    a. 10/2015 ant STEMI >> LHC with 3 v CAD; oLAD tx with POBA >> emergent CABG. b. Multiple evals since that time, early graft failure of SVG-RCA by cath 03/2016. c. 2/19 PCI/DES x1 to pRCA, normal EF.   Carotid artery disease ([place])    a. 40-59% BICA 02/2018.   Depression    Dyspnea    Ectopic atrial tachycardia (HCC)    Esophageal reflux    eosinophil esophagitis   Family history of adverse reaction to anesthesia    "sister has PONV" (06/21/2017)   Former tobacco use    Gout    Hepatitis C    "treated and cured" (06/21/2017)   High cholesterol    History of kidney stones    Hypertension    Ischemic cardiomyopathy    a. EF 25-30% at intraop TEE 4/17  //  b. Limited Echo 5/17 - EF 45-50%, mild ant HK. c. EF 55-65% by cath 09/2017.   Migraine    "3-4/yr" (06/21/2017)   Myocardial infarction ([place]) 10/2015   Palpitations    Sinus bradycardia    a. HR dropping into 40s in 02/2016 -> BB reduced.   Stroke [facility]) 10/2016   "small one; sometimes my memory/cognitive issues" (06/21/2017)   Symptomatic hypotension    a. 02/2016 ER visit -> meds reduced.   Syncope    Wears  dentures    Wears glasses     Past Surgical History:  Procedure Laterality Date   25 GAUGE PARS PLANA VITRECTOMY WITH 20 GAUGE MVR PORT  12/31/2020   Procedure: 25 GAUGE PARS PLANA VITRECTOMY WITH 20 GAUGE MVR PORT;  Surgeon: [name], MD;  Location: [place];  Service: Thoracic;;   ANTERIOR CERVICAL DECOMP/DISCECTOMY FUSION N/A 10/17/2018   Procedure: Anterior Cervical Decompression Fusion - Cervical seven -Thoracic one;  Surgeon: Consuella Lose, MD;  Location: [place];  Service: Neurosurgery;  Laterality: N/A;   BASAL CELL CARCINOMA EXCISION     "forehead   BIOPSY  07/20/2019   Procedure: BIOPSY;  Surgeon: Carol Ada, MD;  Location: [facility];  Service: Endoscopy;;   BRONCHIAL BIOPSY  12/24/2020   Procedure: BRONCHIAL BIOPSIES;  Surgeon: Garner Nash, DO;  Location: [place] [facility];  Service: Pulmonary;;   BRONCHIAL BRUSHINGS  12/24/2020   Procedure: BRONCHIAL BRUSHINGS;  Surgeon: Garner Nash, DO;  Location: [place];  Service: Pulmonary;;   BRONCHIAL NEEDLE ASPIRATION BIOPSY  12/24/2020   Procedure: BRONCHIAL NEEDLE ASPIRATION BIOPSIES;  Surgeon: Garner Nash, DO;  Location: [place];  Service: Pulmonary;;   CARDIAC CATHETERIZATION N/A 11/28/2015   Procedure: Left Heart Cath and Coronary Angiography;  Surgeon: [name], MD;  Location: [place] [facility];  Service: Cardiovascular;  Laterality: N/A;   CARDIAC CATHETERIZATION N/A 11/28/2015   Procedure: Coronary Balloon Angioplasty;  Surgeon: [name], MD;  Location: [place] [facility];  Service: Cardiovascular;  Laterality: N/A;  ostial LAD   CARDIAC CATHETERIZATION N/A 11/28/2015   Procedure: Coronary/Graft Angiography;  Surgeon: [name], MD;  Location: [place] [facility];  Service: Cardiovascular;  Laterality: N/A;  coronaries only    CARDIAC CATHETERIZATION N/A 04/21/2016   Procedure: Left Heart Cath and Coronary Angiography;  Surgeon: Wellington Hampshire, MD;  Location: [place] [facility];  Service: Cardiovascular;  Laterality: N/A;   CARDIAC CATHETERIZATION N/A 06/14/2016   Procedure: Left Heart Cath and Cors/Grafts Angiography;  Surgeon: Lorretta Harp, MD;  Location: [place] [facility];  Service: Cardiovascular;  Laterality: N/A;   CARDIAC CATHETERIZATION N/A 09/08/2016   Procedure: Left Heart Cath and Cors/Grafts Angiography;  Surgeon: Wellington Hampshire, MD;  Location: [place] [facility];  Service: Cardiovascular;  Laterality: N/A;   CARDIAC CATHETERIZATION     CORONARY ARTERY BYPASS GRAFT N/A 11/28/2015   Procedure: CORONARY ARTERY BYPASS GRAFTING (CABG) TIMES FIVE USING LEFT INTERNAL MAMMARY ARTERY AND RIGHT GREATER SAPHENOUS,VIEN HARVEATED BY ENDOVIEN, INTRAOPPRATIVE TEE;  Surgeon: Gaye Pollack, MD;  Location: [place];  Service: Open Heart Surgery;  Laterality: N/A;   CORONARY STENT INTERVENTION N/A 10/05/2017   Procedure: CORONARY STENT INTERVENTION;  Surgeon: [name], MD;  Location: [place] [facility];  Service: Cardiovascular;  Laterality: N/A;   ESOPHAGOGASTRODUODENOSCOPY (EGD) WITH PROPOFOL N/A 07/20/2019   Procedure: ESOPHAGOGASTRODUODENOSCOPY (EGD) WITH PROPOFOL;  Surgeon: Carol Ada, MD;  [facility];  Service: Endoscopy;  Laterality: N/A;   ESOPHAGOGASTRODUODENOSCOPY (EGD) WITH PROPOFOL N/A 01/30/2021   Procedure: ESOPHAGOGASTRODUODENOSCOPY (EGD) WITH PROPOFOL;  Surgeon: Carol Ada, MD;  [facility];  Service: Endoscopy;  Laterality: N/A;   FIDUCIAL MARKER PLACEMENT  12/24/2020   Procedure: FIDUCIAL DYE MARKER PLACEMENT;  Surgeon: Garner Nash, DO;  Location: [place] [facility];  Service: Pulmonary;;   HUMERUS SURGERY Right 1969   "tumor inside bone; filled it w/bone chips"   INTERCOSTAL NERVE BLOCK Right 12/24/2020   Procedure: INTERCOSTAL NERVE BLOCK;  Surgeon: [name], MD;  Location: [place];  Service: Thoracic;  Laterality: Right;   LEFT HEART CATH AND CORS/GRAFTS ANGIOGRAPHY N/A 03/11/2017   Procedure:  Left Heart Cath and Cors/Grafts Angiography;  Surgeon: Leonie Man, MD;  Location: [place] [facility];  Service: Cardiovascular;  Laterality: N/A;   LEFT HEART CATH AND CORS/GRAFTS ANGIOGRAPHY N/A 10/05/2017   Procedure: LEFT HEART CATH AND CORS/GRAFTS ANGIOGRAPHY;  Surgeon: [name], MD;  Location: [place] [facility];  Service: Cardiovascular;  Laterality: N/A;   LEFT HEART CATH AND CORS/GRAFTS ANGIOGRAPHY N/A 04/11/2019   Procedure: LEFT HEART CATH AND CORS/GRAFTS ANGIOGRAPHY;  Surgeon: [name], MD;  Location: [place] [facility];  Service: Cardiovascular;  Laterality: N/A;   NODE DISSECTION Right 12/24/2020   Procedure: NODE DISSECTION;  Surgeon: [name], MD;  Location: [place];  Service: Thoracic;  Laterality: Right;   PERIPHERAL VASCULAR CATHETERIZATION N/A 06/14/2016   Procedure: Lower Extremity Angiography;  Surgeon: Lorretta Harp, MD;  Location: [place] [facility];  Service: Cardiovascular;  Laterality: N/A;   VIDEO BRONCHOSCOPY WITH ENDOBRONCHIAL NAVIGATION Right 12/24/2020   Procedure: VIDEO BRONCHOSCOPY WITH ENDOBRONCHIAL NAVIGATION;  Surgeon: Garner Nash, DO;  Location: [place];  Service: Pulmonary;  Laterality: Right;   VIDEO BRONCHOSCOPY WITH ENDOBRONCHIAL  ULTRASOUND N/A 12/24/2020   Procedure: VIDEO BRONCHOSCOPY WITH ENDOBRONCHIAL ULTRASOUND;  Surgeon: Garner Nash, DO;  Location: [place];  Service: Pulmonary;  Laterality: N/A;   VIDEO BRONCHOSCOPY WITH INSERTION OF INTERBRONCHIAL VALVE (IBV) N/A 12/31/2020   Procedure: VIDEO BRONCHOSCOPY WITH INSERTION OF INTERBRONCHIAL VALVE (IBV).VALVE IN CARTRIDGE 59mm,9mm. CHEST TUBE PLACEMENT.;  Surgeon: [name], MD;  Location: [facility];  Service: Thoracic;  Laterality: N/A;    Allergies: Prednisone, Tetanus toxoids, Wellbutrin [bupropion], Indomethacin, Morphine and related, Other, Scallops [shellfish allergy], and Chantix [varenicline]  Medications: Prior to Admission  medications   Medication Sig Start Date End Date Taking? Authorizing Provider  albuterol (VENTOLIN HFA) 108 (90 Base) MCG/ACT inhaler Inhale 2 puffs into the lungs every 6 (six) hours as needed for wheezing or shortness of breath. 01/12/21  Yes Icard, Octavio Graves, DO  amiodarone (PACERONE) 200 MG tablet Take 1 tablet (200 mg total) by mouth daily. Patient taking differently: Take 200 mg by mouth in the morning. 01/07/21  Yes Conte, Tessa N, PA-C  amLODipine (NORVASC) 10 MG tablet TAKE 1 TABLET(10 MG) BY MOUTH DAILY Patient taking differently: Take 10 mg by mouth in the morning. 12/02/20  Yes [name], MD  aspirin EC 81 MG tablet Take 81 mg by mouth in the morning. Swallow whole.   Yes [provider]  atorvastatin (LIPITOR) 80 MG tablet TAKE 1 TABLET(80 MG) BY MOUTH DAILY Patient taking differently: Take 80 mg by mouth in the morning. 12/10/19  Yes [name], MD  chlordiazePOXIDE (LIBRIUM) 10 MG capsule Take 10-20 mg by mouth 3 (three) times daily as needed for anxiety (sleep).   Yes [provider]  cloNIDine (CATAPRES) 0.1 MG tablet Take 0.1 mg by mouth at bedtime. 04/09/20  Yes [provider]  fluticasone (FLONASE) 50 MCG/ACT nasal spray Place 2 sprays into both nostrils daily. Patient taking differently: Place 2 sprays into both nostrils daily as needed for allergies. 01/12/21  Yes Icard, Bradley L, DO  lidocaine-prilocaine (EMLA) cream Apply 1 application topically as needed. 01/30/21  Yes Heilingoetter, Cassandra L, PA-C  metoprolol succinate (TOPROL-XL) 25 MG 24 hr tablet Take 1 tablet (25 mg total) by mouth daily. Patient taking differently: Take 25 mg by mouth in the morning. Additional if needed for increase pulse rate 08/27/20  Yes [name], MD  Multiple Vitamin (MULTIVITAMIN WITH MINERALS) TABS tablet Take 1 tablet by mouth in the morning. Centrum   Yes [provider]  ranolazine (RANEXA) 1000 MG SR tablet Take 1 tablet  (1,000 mg total) by mouth 2 (two) times daily. 11/08/19  Yes [name], NP  Tiotropium Bromide-Olodaterol (STIOLTO RESPIMAT) 2.5-2.5 MCG/ACT AERS Inhale 2 puffs into the lungs daily. Patient taking differently: Inhale 1 puff into the lungs in the morning and at bedtime. 01/12/21  Yes Icard, Octavio Graves, DO  clopidogrel (PLAVIX) 75 MG tablet TAKE 1 TABLET BY MOUTH EVERY DAY Patient taking differently: Take 75 mg by mouth in the morning. 02/06/20   [name], MD  nitroGLYCERIN (NITROSTAT) 0.4 MG SL tablet PLACE 1 TABLET UNDER THE TONGUE EVERY 5 MINUTES AS NEEDED FOR CHEST PAIN. 3 DOSES MAX Patient taking differently: Place 0.4 mg under the tongue every 5 (five) minutes x 3 doses as needed for chest pain. 3 DOSES MAX 04/16/20   [name], MD  prochlorperazine (COMPAZINE) 10 MG tablet Take 1 tablet (10 mg total) by mouth every 6 (six) hours as needed. Patient taking differently: Take 10 mg  by mouth every 6 (six) hours as needed for nausea. 01/14/21   Heilingoetter, Cassandra L, PA-C     Family History  Problem Relation Age of Onset   Lung cancer Mother    Heart Problems Father    Heart attack Father 1   Stroke Father    Heart failure Father    Heart attack Maternal Grandmother    Stroke Maternal Grandmother    Heart attack Paternal Uncle    Hypertension Brother    Autoimmune disease Neg Hx     Social History   Socioeconomic History   Marital status: Married    Spouse name: [name]   Number of children: 3   Years of education: College   Highest education level: Not on file  Occupational History   Occupation: Scientist, research (physical sciences): SELF-EMPLOYED  Tobacco Use   Smoking status: Former    Packs/day: 0.75    Years: 44.00    Pack years: 33.00    Types: Cigarettes    Quit date: 11/28/2015    Years since quitting: 5.1   Smokeless tobacco: Never  Vaping Use   Vaping Use: Never used  Substance and Sexual Activity   Alcohol use: Yes    Comment: occ   Drug  use: No    Comment: 06/21/2017 "nothing since the 1980s"   Sexual activity: Yes  Other Topics Concern   Not on file  Social History Narrative   Patient lives at home with his spouse.   Caffeine Use: yes   Social Determinants of Health   Financial Resource Strain: Not on file  Food Insecurity: Not on file  Transportation Needs: Not on file  Physical Activity: Not on file  Stress: Not on file  Social Connections: Not on file      Review of Systems denies fever,HA, dyspnea, cough, N/V or bleeding; he does have some rt lower chest/RUQ soreness from recent surgery.   Vital Signs: BP 135/80   Pulse 98   Temp 98 F (36.7 C) (Oral)   Ht 5\' 9"  (1.753 m)   Wt 195 lb (88.5 kg)   SpO2 97%   BMI 28.80 kg/m   Physical Exam awake/alert; chest- occ wheeze on right, left clear; heart- RRR; abd- soft,+BS, mildly tender RUQ; no LE edema  Imaging: MR Brain W Wo Contrast  Result Date: 02/05/2021 CLINICAL DATA:  Follow-up CNS neoplasm EXAM: MRI HEAD WITHOUT AND WITH CONTRAST TECHNIQUE: Multiplanar, multiecho pulse sequences of the brain and surrounding structures were obtained without and with intravenous contrast. CONTRAST:  58mL MULTIHANCE GADOBENATE DIMEGLUMINE 529 MG/ML IV SOLN COMPARISON:  01/27/2021 FINDINGS: BRAIN New Lesions: 3 mm nodule at the high left and parasagittal frontal lobe seen on 12:147 Larger lesions: None. Stable or Smaller lesions: 6 mm mm enhancing lesion located in the lateral left cerebellum and seen on 11:40. Other Brain findings: Mild vasogenic edema around the left cerebellar metastasis. No acute infarct, acute hemorrhage, hydrocephalus, or collection. Vascular: Normal flow voids and vascular enhancements Skull and upper cervical spine: Normal marrow signal Sinuses/Orbits: Negative IMPRESSION: 1. 3 mm newly seen metastasis in the high left frontal lobe. 2. 6 mm unchanged left cerebellar metastasis. Electronically Signed   By: Monte Fantasia M.D.   On: 02/05/2021 06:09    MR Brain W Wo Contrast  Result Date: 01/27/2021 CLINICAL DATA:  High-grade neuroendocrine carcinoma. Assess for metastatic disease. EXAM: MRI HEAD WITHOUT AND WITH CONTRAST TECHNIQUE: Multiplanar, multiecho pulse sequences of the brain and surrounding structures were obtained without  and with intravenous contrast. CONTRAST:  61mL GADAVIST GADOBUTROL 1 MMOL/ML IV SOLN COMPARISON:  02/23/2020 FINDINGS: Brain: Diffusion imaging does not show any acute or subacute infarction. Newly seen 6.5 mm ring-enhancing lesion in the left lateral cerebellum consistent with a small metastasis. Minimal adjacent edema. There is some susceptibility artifact associated with this metastasis likely indicating microhemorrhage. No second intracranial metastatic lesion. Minimal small vessel change affects the hemispheric white matter. No large vessel territory infarction. No hydrocephalus or extra-axial collection. Vascular: Major vessels at the base of the brain show flow. Skull and upper cervical spine: Negative Sinuses/Orbits: Clear/normal Other: None IMPRESSION: 6.5 mm ring-enhancing lesion of the left lateral cerebellum with some hemosiderin/blood products, consistent with a solitary metastasis. Minimal adjacent edema. No second lesion identified. Electronically Signed   By: Nelson Chimes M.D.   On: 01/27/2021 14:01    Labs:  CBC: Recent Labs    12/28/20 0026 12/30/20 0120 01/14/21 1307 01/29/21 0815  WBC 11.7* 10.0 9.7 8.0  HGB 11.8* 11.6* 13.1 12.4*  HCT 36.0* 35.4* 39.6 38.8*  PLT 208 236 338 215    COAGS: Recent Labs    12/22/20 1430  INR 1.0  APTT 51*    BMP: Recent Labs    04/04/20 0047 05/15/20 0627 05/27/20 1039 10/24/20 0952 12/26/20 0015 12/28/20 0026 01/14/21 1307 01/29/21 0815  NA 137 136 137   < > 137 135 142 143  K 3.9 4.1 4.4   < > 4.1 4.1 4.4 4.3  CL 104 101 102   < > 105 102 106 108  CO2 20* 24 26   < > 26 26 26 24   GLUCOSE 105* 111* 112*   < > 109* 114* 105* 109*  BUN 25*  28* 16   < > 23 24* 17 20  CALCIUM 9.4 9.5 9.8   < > 8.8* 8.7* 9.6 9.3  CREATININE 1.36* 1.41* 1.01   < > 0.99 1.13 1.08 1.16  GFRNONAA 54* 52* >60   < > >60 >60 >60 >60  GFRAA >60 >60 >60  --   --   --   --   --    < > = values in this interval not displayed.    LIVER FUNCTION TESTS: Recent Labs    11/18/20 0628 12/22/20 1430 12/26/20 0015 01/29/21 0815  BILITOT 0.4 1.1 0.9 0.4  AST 27 24 20  14*  ALT 34 32 15 19  ALKPHOS 42 66 48 97  PROT 5.6* 8.0 5.8* 6.6  ALBUMIN 3.2* 4.5 3.2* 3.5    TUMOR MARKERS: No results for input(s): AFPTM, CEA, CA199, CHROMGRNA in the last 8760 hours.  Assessment and Plan: 65 y.o. male , ex-smoker, with history of recently diagnosed stage IV non-small cell lung cancer, large cell neuroendocrine carcinoma who presented with a right upper lobe lung nodule in addition to right hilar adenopathy and solitary metastatic brain lesion in the left cerebellum.  He is status post right upper lobectomy on 12/24/20. He presents today for port a cath placement for chemotherapy.Risks and benefits of image guided port-a-catheter placement was discussed with the patient including, but not limited to bleeding, infection, pneumothorax, or fibrin sheath development and need for additional procedures.  All of the patient's questions were answered, patient is agreeable to proceed. Consent signed and in chart.    Thank you for this interesting consult.  I greatly enjoyed meeting [name] and look forward to participating in their care.  A copy of this report was sent to  the requesting provider on this date.  Electronically Signed: D. Rowe Robert, PA-C 02/06/2021, 11:09 AM   I spent a total of  25 minutes   in face to face in clinical consultation, greater than 50% of which was counseling/coordinating care for port a cath placement

## 2021-02-07 IMAGING — DX PORTABLE CHEST - 1 VIEW
1 series · 1 of 1 positions shown · non-contrast
Comparison: February 07, 2019

CLINICAL DATA: Epigastric pain since this morning

EXAM:
PORTABLE CHEST 1 VIEW

[chest ap]
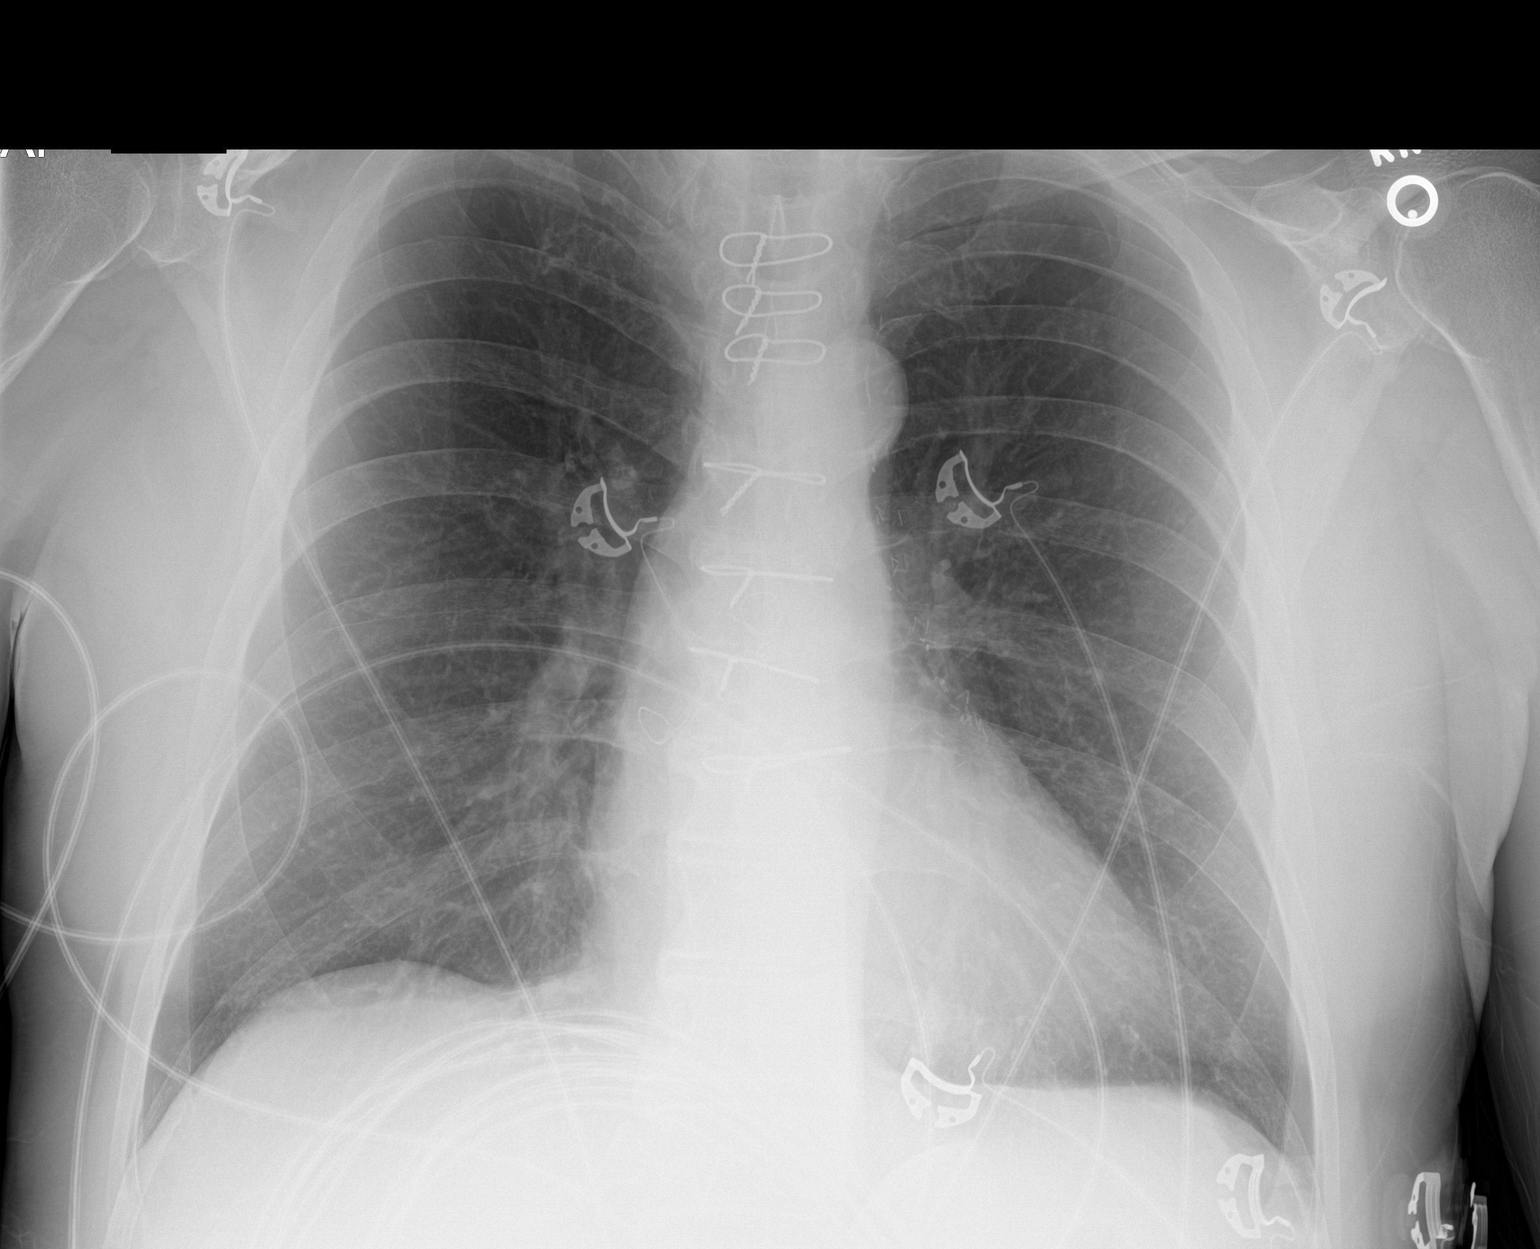

[1 of 1 positions shown; findings below may reference images not displayed]

FINDINGS: The mediastinal contour and cardiac silhouette are stable. Patient
status post prior CABG and median sternotomy. There is no focal
infiltrate, pulmonary edema, or pleural effusion. The osseous
structures are stable.
IMPRESSION: No acute cardiopulmonary disease identified.

## 2021-02-09 ENCOUNTER — Other Ambulatory Visit: Payer: Self-pay

## 2021-02-09 ENCOUNTER — Inpatient Hospital Stay (HOSPITAL_BASED_OUTPATIENT_CLINIC_OR_DEPARTMENT_OTHER): Payer: Medicare Other | Admitting: Internal Medicine

## 2021-02-09 ENCOUNTER — Inpatient Hospital Stay: Payer: Medicare Other

## 2021-02-09 ENCOUNTER — Encounter: Payer: Self-pay | Admitting: Internal Medicine

## 2021-02-09 VITALS — BP 134/88 | HR 100 | Temp 98.8°F | Resp 18

## 2021-02-09 DIAGNOSIS — C7A1 Malignant poorly differentiated neuroendocrine tumors: Secondary | ICD-10-CM

## 2021-02-09 DIAGNOSIS — Z5111 Encounter for antineoplastic chemotherapy: Secondary | ICD-10-CM | POA: Diagnosis not present

## 2021-02-09 DIAGNOSIS — C7931 Secondary malignant neoplasm of brain: Secondary | ICD-10-CM | POA: Diagnosis not present

## 2021-02-09 DIAGNOSIS — C3491 Malignant neoplasm of unspecified part of right bronchus or lung: Secondary | ICD-10-CM | POA: Diagnosis not present

## 2021-02-09 DIAGNOSIS — R109 Unspecified abdominal pain: Secondary | ICD-10-CM | POA: Diagnosis not present

## 2021-02-09 LAB — CMP (CANCER CENTER ONLY)
ALT: 16 U/L (ref 0–44)
AST: 9 U/L — ABNORMAL LOW (ref 15–41)
Albumin: 3.9 g/dL (ref 3.5–5.0)
Alkaline Phosphatase: 104 U/L (ref 38–126)
Anion gap: 13 (ref 5–15)
BUN: 17 mg/dL (ref 8–23)
CO2: 21 mmol/L — ABNORMAL LOW (ref 22–32)
Calcium: 9.8 mg/dL (ref 8.9–10.3)
Chloride: 106 mmol/L (ref 98–111)
Creatinine: 1.28 mg/dL — ABNORMAL HIGH (ref 0.61–1.24)
GFR, Estimated: >60 mL/min (ref >60)
Glucose, Bld: 134 mg/dL — ABNORMAL HIGH (ref 70–99)
Potassium: 4 mmol/L (ref 3.5–5.1)
Sodium: 140 mmol/L (ref 135–145)
Total Bilirubin: 0.6 mg/dL (ref 0.3–1.2)
Total Protein: 7.5 g/dL (ref 6.5–8.1)

## 2021-02-09 LAB — CBC WITH DIFFERENTIAL (CANCER CENTER ONLY)
Abs Immature Granulocytes: 0.06 10*3/uL (ref 0.00–0.07)
Basophils Absolute: 0.1 10*3/uL (ref 0.0–0.1)
Basophils Relative: 1 %
Eosinophils Absolute: 0.4 10*3/uL (ref 0.0–0.5)
Eosinophils Relative: 3 %
HCT: 43.3 % (ref 39.0–52.0)
Hemoglobin: 14.3 g/dL (ref 13.0–17.0)
Immature Granulocytes: 1 %
Lymphocytes Relative: 20 %
Lymphs Abs: 2.5 10*3/uL (ref 0.7–4.0)
MCH: 29.9 pg (ref 26.0–34.0)
MCHC: 33 g/dL (ref 30.0–36.0)
MCV: 90.4 fL (ref 80.0–100.0)
Monocytes Absolute: 1 10*3/uL (ref 0.1–1.0)
Monocytes Relative: 8 %
Neutro Abs: 8.8 10*3/uL — ABNORMAL HIGH (ref 1.7–7.7)
Neutrophils Relative %: 67 %
Platelet Count: 271 10*3/uL (ref 150–400)
RBC: 4.79 MIL/uL (ref 4.22–5.81)
RDW: 12.5 % (ref 11.5–15.5)
WBC Count: 12.9 10*3/uL — ABNORMAL HIGH (ref 4.0–10.5)
nRBC: 0 % (ref 0.0–0.2)

## 2021-02-09 LAB — URINALYSIS, COMPLETE (UACMP) WITH MICROSCOPIC
Bacteria, UA: NONE SEEN
Bilirubin Urine: NEGATIVE
Glucose, UA: NEGATIVE mg/dL
Hgb urine dipstick: NEGATIVE
Ketones, ur: NEGATIVE mg/dL
Leukocytes,Ua: NEGATIVE
Nitrite: NEGATIVE
Protein, ur: NEGATIVE mg/dL
Specific Gravity, Urine: 1.006 (ref 1.005–1.030)
pH: 6 (ref 5.0–8.0)

## 2021-02-09 LAB — MAGNESIUM: Magnesium: 1.9 mg/dL (ref 1.7–2.4)

## 2021-02-09 MED ORDER — SODIUM CHLORIDE 0.9 % IV SOLN
120.0000 mg/m2 | Freq: Once | INTRAVENOUS | Status: AC
  Administered 2021-02-09: 250 mg via INTRAVENOUS
  Filled 2021-02-09: qty 12.5

## 2021-02-09 MED ORDER — PALONOSETRON HCL INJECTION 0.25 MG/5ML
0.2500 mg | Freq: Once | INTRAVENOUS | Status: AC
  Administered 2021-02-09: 0.25 mg via INTRAVENOUS

## 2021-02-09 MED ORDER — SODIUM CHLORIDE 0.9% FLUSH
10.0000 mL | INTRAVENOUS | Status: DC | PRN
  Administered 2021-02-09: 10 mL
  Filled 2021-02-09: qty 10

## 2021-02-09 MED ORDER — MAGNESIUM SULFATE 2 GM/50ML IV SOLN
2.0000 g | Freq: Once | INTRAVENOUS | Status: AC
Start: 2021-02-09 — End: 2021-02-09
  Administered 2021-02-09: 2 g via INTRAVENOUS

## 2021-02-09 MED ORDER — SODIUM CHLORIDE 0.9 % IV SOLN
150.0000 mg | Freq: Once | INTRAVENOUS | Status: AC
  Administered 2021-02-09: 150 mg via INTRAVENOUS
  Filled 2021-02-09: qty 150

## 2021-02-09 MED ORDER — PALONOSETRON HCL INJECTION 0.25 MG/5ML
INTRAVENOUS | Status: AC
  Filled 2021-02-09: qty 5

## 2021-02-09 MED ORDER — SODIUM CHLORIDE 0.9 % IV SOLN: Freq: Once | INTRAVENOUS | Status: DC

## 2021-02-09 MED ORDER — HEPARIN SOD (PORK) LOCK FLUSH 100 UNIT/ML IV SOLN
500.0000 [IU] | Freq: Once | INTRAVENOUS | Status: AC | PRN
  Administered 2021-02-09: 500 [IU]
  Filled 2021-02-09: qty 5

## 2021-02-09 MED ORDER — MAGNESIUM SULFATE 2 GM/50ML IV SOLN
INTRAVENOUS | Status: AC
  Filled 2021-02-09: qty 50

## 2021-02-09 MED ORDER — OXYCODONE-ACETAMINOPHEN 5-325 MG PO TABS: 1.0000 | ORAL_TABLET | Freq: Three times a day (TID) | ORAL | 0 refills | Status: DC | PRN

## 2021-02-09 MED ORDER — SODIUM CHLORIDE 0.9 % IV SOLN
Freq: Once | INTRAVENOUS | Status: AC
  Filled 2021-02-09: qty 250

## 2021-02-09 MED ORDER — MAGNESIUM SULFATE 2 GM/50ML IV SOLN: 2.0000 g | Freq: Once | INTRAVENOUS | Status: DC

## 2021-02-09 MED ORDER — SODIUM CHLORIDE 0.9 % IV SOLN
60.0000 mg/m2 | Freq: Once | INTRAVENOUS | Status: AC
  Administered 2021-02-09: 123 mg via INTRAVENOUS
  Filled 2021-02-09: qty 123

## 2021-02-09 MED ORDER — SODIUM CHLORIDE 0.9 % IV SOLN
10.0000 mg | Freq: Once | INTRAVENOUS | Status: AC
  Administered 2021-02-09: 10 mg via INTRAVENOUS
  Filled 2021-02-09: qty 10

## 2021-02-09 MED ORDER — POTASSIUM CHLORIDE IN NACL 20-0.9 MEQ/L-% IV SOLN
INTRAVENOUS | Status: DC
  Filled 2021-02-09 (×2): qty 1000

## 2021-02-09 NOTE — Progress Notes (Signed)
[place] Telephone:([phone]) [phone]   [phone]  OFFICE PROGRESS NOTE  [name], MD [place] [address]  DIAGNOSIS:  Stage IV (pT1c, N1, M1b) large cell neuroendocrine carcinoma.  He presented with a right upper lobe lung nodule and hilar lymphadenopathy.  He was diagnosed in April 2022. He also had a small metastatic brain lesion.    PDL1: 0%   PRIOR THERAPY: Robotic assisted thoracic surgery with a right upper lobectomy which was performed on 12/24/2020.     CURRENT THERAPY: Adjuvant chemotherapy with cisplatin 80 mg/m2 on day 1, etoposide 100 mg per metered squared on days 1, 2, and 3, IV every 3 weeks. First dose on 02/09/21.   INTERVAL HISTORY: [name] 64 y.o. male was seen in the chemotherapy infusion room today for evaluation after complaining of right flank pain started last night and persisted through the morning.  He has a history of kidney stones in the past.  He denied having any current dysuria or hematuria.  He denied having any increased frequency or hesitation.  He continues to have the right-sided chest pain from the surgical scar with mild shortness of breath with exertion no cough or hemoptysis.  The patient has no recent weight loss or night sweats.  He has no nausea or vomiting.  He has no fever or chills.  He is starting the first cycle of his systemic chemotherapy today.  MEDICAL HISTORY: Past Medical History:  Diagnosis Date   Anxiety    Arthritis    Basal cell carcinoma (BCC) of forehead    CAD (coronary artery disease)    a. 10/2015 ant STEMI >> LHC with 3 v CAD; oLAD tx with POBA >> emergent CABG. b. Multiple evals since that time, early graft failure of SVG-RCA by cath 03/2016. c. 2/19 PCI/DES x1 to pRCA, normal EF.   Carotid artery disease ([place])    a. 40-59% BICA 02/2018.   Depression    Dyspnea    Ectopic atrial tachycardia (HCC)    Esophageal reflux    eosinophil esophagitis   Family  history of adverse reaction to anesthesia    "sister has PONV" (06/21/2017)   Former tobacco use    Gout    Hepatitis C    "treated and cured" (06/21/2017)   High cholesterol    History of kidney stones    Hypertension    Ischemic cardiomyopathy    a. EF 25-30% at intraop TEE 4/17  //  b. Limited Echo 5/17 - EF 45-50%, mild ant HK. c. EF 55-65% by cath 09/2017.   Migraine    "3-4/yr" (06/21/2017)   Myocardial infarction ([place]) 10/2015   Palpitations    Sinus bradycardia    a. HR dropping into 40s in 02/2016 -> BB reduced.   Stroke [facility]) 10/2016   "small one; sometimes my memory/cognitive issues" (06/21/2017)   Symptomatic hypotension    a. 02/2016 ER visit -> meds reduced.   Syncope    Wears dentures    Wears glasses     ALLERGIES:  is allergic to prednisone, tetanus toxoids, wellbutrin [bupropion], indomethacin, morphine and related, other, scallops [shellfish allergy], and chantix [varenicline].  MEDICATIONS:  Current Outpatient Medications  Medication Sig Dispense Refill   albuterol (VENTOLIN HFA) 108 (90 Base) MCG/ACT inhaler Inhale 2 puffs into the lungs every 6 (six) hours as needed for wheezing or shortness of breath. 8 g 2   amiodarone (PACERONE) 200 MG  tablet Take 1 tablet (200 mg total) by mouth daily. (Patient taking differently: Take 200 mg by mouth in the morning.) 30 tablet 1   amLODipine (NORVASC) 10 MG tablet TAKE 1 TABLET(10 MG) BY MOUTH DAILY (Patient taking differently: Take 10 mg by mouth in the morning.) 90 tablet 1   aspirin EC 81 MG tablet Take 81 mg by mouth in the morning. Swallow whole.     atorvastatin (LIPITOR) 80 MG tablet TAKE 1 TABLET(80 MG) BY MOUTH DAILY 90 tablet 2   chlordiazePOXIDE (LIBRIUM) 10 MG capsule Take 10-20 mg by mouth 3 (three) times daily as needed for anxiety (sleep).     cloNIDine (CATAPRES) 0.1 MG tablet Take 0.1 mg by mouth at bedtime.     clopidogrel (PLAVIX) 75 MG tablet TAKE 1 TABLET BY MOUTH EVERY DAY (Patient taking  differently: Take 75 mg by mouth in the morning.) 90 tablet 1   fluticasone (FLONASE) 50 MCG/ACT nasal spray Place 2 sprays into both nostrils daily. (Patient taking differently: Place 2 sprays into both nostrils daily as needed for allergies.) 16 g 2   lidocaine-prilocaine (EMLA) cream Apply 1 application topically as needed. 30 g 2   metoprolol succinate (TOPROL-XL) 25 MG 24 hr tablet Take 1 tablet (25 mg total) by mouth daily. (Patient taking differently: Take 25 mg by mouth in the morning. Additional if needed for increase pulse rate) 90 tablet 3   Multiple Vitamin (MULTIVITAMIN WITH MINERALS) TABS tablet Take 1 tablet by mouth in the morning. Centrum     nitroGLYCERIN (NITROSTAT) 0.4 MG SL tablet PLACE 1 TABLET UNDER THE TONGUE EVERY 5 MINUTES AS NEEDED FOR CHEST PAIN. 3 DOSES MAX (Patient taking differently: Place 0.4 mg under the tongue every 5 (five) minutes x 3 doses as needed for chest pain. 3 DOSES MAX) 25 tablet 4   prochlorperazine (COMPAZINE) 10 MG tablet Take 1 tablet (10 mg total) by mouth every 6 (six) hours as needed. (Patient taking differently: Take 10 mg by mouth every 6 (six) hours as needed for nausea.) 30 tablet 2   ranolazine (RANEXA) 1000 MG SR tablet Take 1 tablet (1,000 mg total) by mouth 2 (two) times daily. 180 tablet 0   Tiotropium Bromide-Olodaterol (STIOLTO RESPIMAT) 2.5-2.5 MCG/ACT AERS Inhale 2 puffs into the lungs daily. (Patient taking differently: Inhale 1 puff into the lungs in the morning and at bedtime.) 4 g 3   No current facility-administered medications for this visit.   Facility-Administered Medications Ordered in Other Visits  Medication Dose Route Frequency Provider Last Rate Last Admin   0.9 % NaCl with KCl 20 mEq/ L  infusion   Intravenous Continuous Si Gaul, MD 250 mL/hr at 02/09/21 0910 New Bag at 02/09/21 0910   CISplatin (PLATINOL) 123 mg in sodium chloride 0.9 % 500 mL chemo infusion  60 mg/m2 (Treatment Plan Recorded) Intravenous Once  Si Gaul, MD       etoposide (VEPESID) 250 mg in sodium chloride 0.9 % 1,000 mL chemo infusion  120 mg/m2 (Treatment Plan Recorded) Intravenous Once Si Gaul, MD       heparin lock flush 100 unit/mL  500 Units Intracatheter Once PRN Si Gaul, MD       sodium chloride flush (NS) 0.9 % injection 10 mL  10 mL Intracatheter PRN Si Gaul, MD        SURGICAL HISTORY:  Past Surgical History:  Procedure Laterality Date   60 GAUGE PARS PLANA VITRECTOMY WITH 20 GAUGE MVR PORT  12/31/2020  Procedure: 25 GAUGE PARS PLANA VITRECTOMY WITH 20 GAUGE MVR PORT;  Surgeon: [name], MD;  Location: [place];  Service: Thoracic;;   ANTERIOR CERVICAL DECOMP/DISCECTOMY FUSION N/A 10/17/2018   Procedure: Anterior Cervical Decompression Fusion - Cervical seven -Thoracic one;  Surgeon: Consuella Lose, MD;  Location: [place];  Service: Neurosurgery;  Laterality: N/A;   BASAL CELL CARCINOMA EXCISION     "forehead   BIOPSY  07/20/2019   Procedure: BIOPSY;  Surgeon: Carol Ada, MD;  [facility]: [facility];  Service: Endoscopy;;   BRONCHIAL BIOPSY  12/24/2020   Procedure: BRONCHIAL BIOPSIES;  Surgeon: Garner Nash, DO;  Location: [place] [facility];  Service: Pulmonary;;   BRONCHIAL BRUSHINGS  12/24/2020   Procedure: BRONCHIAL BRUSHINGS;  Surgeon: Garner Nash, DO;  Location: [place];  Service: Pulmonary;;   BRONCHIAL NEEDLE ASPIRATION BIOPSY  12/24/2020   Procedure: BRONCHIAL NEEDLE ASPIRATION BIOPSIES;  Surgeon: Garner Nash, DO;  Location: [place];  Service: Pulmonary;;   CARDIAC CATHETERIZATION N/A 11/28/2015   Procedure: Left Heart Cath and Coronary Angiography;  Surgeon: [name], MD;  Location: [place] [facility];  Service: Cardiovascular;  Laterality: N/A;   CARDIAC CATHETERIZATION N/A 11/28/2015   Procedure: Coronary Balloon Angioplasty;  Surgeon: [name], MD;  Location: [place] [facility];  Service: Cardiovascular;  Laterality: N/A;   ostial LAD   CARDIAC CATHETERIZATION N/A 11/28/2015   Procedure: Coronary/Graft Angiography;  Surgeon: [name], MD;  Location: [place] [facility];  Service: Cardiovascular;  Laterality: N/A;  coronaries only    CARDIAC CATHETERIZATION N/A 04/21/2016   Procedure: Left Heart Cath and Coronary Angiography;  Surgeon: Wellington Hampshire, MD;  Location: [place] [facility];  Service: Cardiovascular;  Laterality: N/A;   CARDIAC CATHETERIZATION N/A 06/14/2016   Procedure: Left Heart Cath and Cors/Grafts Angiography;  Surgeon: Lorretta Harp, MD;  Location: [place] [facility];  Service: Cardiovascular;  Laterality: N/A;   CARDIAC CATHETERIZATION N/A 09/08/2016   Procedure: Left Heart Cath and Cors/Grafts Angiography;  Surgeon: Wellington Hampshire, MD;  Location: [place] [facility];  Service: Cardiovascular;  Laterality: N/A;   CARDIAC CATHETERIZATION     CORONARY ARTERY BYPASS GRAFT N/A 11/28/2015   Procedure: CORONARY ARTERY BYPASS GRAFTING (CABG) TIMES FIVE USING LEFT INTERNAL MAMMARY ARTERY AND RIGHT GREATER SAPHENOUS,VIEN HARVEATED BY ENDOVIEN, INTRAOPPRATIVE TEE;  Surgeon: Gaye Pollack, MD;  Location: [place];  Service: Open Heart Surgery;  Laterality: N/A;   CORONARY STENT INTERVENTION N/A 10/05/2017   Procedure: CORONARY STENT INTERVENTION;  Surgeon: [name], MD;  Location: [place] [facility];  Service: Cardiovascular;  Laterality: N/A;   ESOPHAGOGASTRODUODENOSCOPY (EGD) WITH PROPOFOL N/A 07/20/2019   Procedure: ESOPHAGOGASTRODUODENOSCOPY (EGD) WITH PROPOFOL;  Surgeon: Carol Ada, MD;  [facility];  Service: Endoscopy;  Laterality: N/A;   ESOPHAGOGASTRODUODENOSCOPY (EGD) WITH PROPOFOL N/A 01/30/2021   Procedure: ESOPHAGOGASTRODUODENOSCOPY (EGD) WITH PROPOFOL;  Surgeon: Carol Ada, MD;  [facility];  Service: Endoscopy;  Laterality: N/A;   FIDUCIAL MARKER PLACEMENT  12/24/2020   Procedure: FIDUCIAL DYE MARKER PLACEMENT;  Surgeon: Garner Nash, DO;   Location: [place] [facility];  Service: Pulmonary;;   HUMERUS SURGERY Right 1969   "tumor inside bone; filled it w/bone chips"   INTERCOSTAL NERVE BLOCK Right 12/24/2020   Procedure: INTERCOSTAL NERVE BLOCK;  Surgeon: [name], MD;  Location: [place];  Service: Thoracic;  Laterality: Right;   IR IMAGING GUIDED PORT INSERTION  02/06/2021   LEFT HEART CATH AND  CORS/GRAFTS ANGIOGRAPHY N/A 03/11/2017   Procedure: Left Heart Cath and Cors/Grafts Angiography;  Surgeon: Leonie Man, MD;  Location: [place] [facility];  Service: Cardiovascular;  Laterality: N/A;   LEFT HEART CATH AND CORS/GRAFTS ANGIOGRAPHY N/A 10/05/2017   Procedure: LEFT HEART CATH AND CORS/GRAFTS ANGIOGRAPHY;  Surgeon: [name], MD;  Location: [place] [facility];  Service: Cardiovascular;  Laterality: N/A;   LEFT HEART CATH AND CORS/GRAFTS ANGIOGRAPHY N/A 04/11/2019   Procedure: LEFT HEART CATH AND CORS/GRAFTS ANGIOGRAPHY;  Surgeon: [name], MD;  Location: [place] [facility];  Service: Cardiovascular;  Laterality: N/A;   NODE DISSECTION Right 12/24/2020   Procedure: NODE DISSECTION;  Surgeon: [name], MD;  Location: [place];  Service: Thoracic;  Laterality: Right;   PERIPHERAL VASCULAR CATHETERIZATION N/A 06/14/2016   Procedure: Lower Extremity Angiography;  Surgeon: Lorretta Harp, MD;  Location: [place] [facility];  Service: Cardiovascular;  Laterality: N/A;   VIDEO BRONCHOSCOPY WITH ENDOBRONCHIAL NAVIGATION Right 12/24/2020   Procedure: VIDEO BRONCHOSCOPY WITH ENDOBRONCHIAL NAVIGATION;  Surgeon: Garner Nash, DO;  Location: [place];  Service: Pulmonary;  Laterality: Right;   VIDEO BRONCHOSCOPY WITH ENDOBRONCHIAL ULTRASOUND N/A 12/24/2020   Procedure: VIDEO BRONCHOSCOPY WITH ENDOBRONCHIAL ULTRASOUND;  Surgeon: Garner Nash, DO;  Location: [place];  Service: Pulmonary;  Laterality: N/A;   VIDEO BRONCHOSCOPY WITH INSERTION OF INTERBRONCHIAL VALVE (IBV) N/A 12/31/2020   Procedure:  VIDEO BRONCHOSCOPY WITH INSERTION OF INTERBRONCHIAL VALVE (IBV).VALVE IN CARTRIDGE 14m,9mm. CHEST TUBE PLACEMENT.;  Surgeon: LLajuana Matte MD;  Location[facility];  Service: Thoracic;  Laterality: N/A;    REVIEW OF SYSTEMS:  Constitutional: positive for fatigue Eyes: negative Ears, nose, mouth, throat, and face: negative Respiratory: positive for dyspnea on exertion and pleurisy/chest pain Cardiovascular: negative Gastrointestinal: negative Genitourinary:positive for right flank pain Integument/breast: negative Hematologic/lymphatic: negative Musculoskeletal:negative Neurological: negative Behavioral/Psych: negative Endocrine: negative Allergic/Immunologic: negative   PHYSICAL EXAMINATION: General appearance: alert, cooperative, fatigued, and no distress Head: Normocephalic, without obvious abnormality, atraumatic Neck: no adenopathy, no JVD, supple, symmetrical, trachea midline, and thyroid not enlarged, symmetric, no tenderness/mass/nodules Lymph nodes: Cervical, supraclavicular, and axillary nodes normal. Resp: clear to auscultation bilaterally Back: Tenderness in the right flank area Cardio: regular rate and rhythm, S1, S2 normal, no murmur, click, rub or gallop GI: soft, non-tender; bowel sounds normal; no masses,  no organomegaly Extremities: extremities normal, atraumatic, no cyanosis or edema Neurologic: Alert and oriented X 3, normal strength and tone. Normal symmetric reflexes. Normal coordination and gait  ECOG PERFORMANCE STATUS: 1 - Symptomatic but completely ambulatory  There were no vitals taken for this visit.  LABORATORY DATA: Lab Results  Component Value Date   WBC 12.9 (H) 02/09/2021   HGB 14.3 02/09/2021   HCT 43.3 02/09/2021   MCV 90.4 02/09/2021   PLT 271 02/09/2021      Chemistry      Component Value Date/Time   NA 140 02/09/2021 0738   K 4.0 02/09/2021 0738   CL 106 02/09/2021 0738   CO2 21 (L) 02/09/2021 0738   BUN 17 02/09/2021 0738    CREATININE 1.28 (H) 02/09/2021 0738   CREATININE 1.00 02/12/2016 1038      Component Value Date/Time   CALCIUM 9.8 02/09/2021 0738   ALKPHOS 104 02/09/2021 0738   AST 9 (L) 02/09/2021 0738   ALT 16 02/09/2021 0738   BILITOT 0.6 02/09/2021 0738       RADIOGRAPHIC STUDIES: MR Brain W Wo Contrast  Result Date: 02/05/2021 CLINICAL  DATA:  Follow-up CNS neoplasm EXAM: MRI HEAD WITHOUT AND WITH CONTRAST TECHNIQUE: Multiplanar, multiecho pulse sequences of the brain and surrounding structures were obtained without and with intravenous contrast. CONTRAST:  67m MULTIHANCE GADOBENATE DIMEGLUMINE 529 MG/ML IV SOLN COMPARISON:  01/27/2021 FINDINGS: BRAIN New Lesions: 3 mm nodule at the high left and parasagittal frontal lobe seen on 12:147 Larger lesions: None. Stable or Smaller lesions: 6 mm mm enhancing lesion located in the lateral left cerebellum and seen on 11:40. Other Brain findings: Mild vasogenic edema around the left cerebellar metastasis. No acute infarct, acute hemorrhage, hydrocephalus, or collection. Vascular: Normal flow voids and vascular enhancements Skull and upper cervical spine: Normal marrow signal Sinuses/Orbits: Negative IMPRESSION: 1. 3 mm newly seen metastasis in the high left frontal lobe. 2. 6 mm unchanged left cerebellar metastasis. Electronically Signed   By: JMonte FantasiaM.D.   On: 02/05/2021 06:09   MR Brain W Wo Contrast  Result Date: 01/27/2021 CLINICAL DATA:  High-grade neuroendocrine carcinoma. Assess for metastatic disease. EXAM: MRI HEAD WITHOUT AND WITH CONTRAST TECHNIQUE: Multiplanar, multiecho pulse sequences of the brain and surrounding structures were obtained without and with intravenous contrast. CONTRAST:  820mGADAVIST GADOBUTROL 1 MMOL/ML IV SOLN COMPARISON:  02/23/2020 FINDINGS: Brain: Diffusion imaging does not show any acute or subacute infarction. Newly seen 6.5 mm ring-enhancing lesion in the left lateral cerebellum consistent with a small metastasis.  Minimal adjacent edema. There is some susceptibility artifact associated with this metastasis likely indicating microhemorrhage. No second intracranial metastatic lesion. Minimal small vessel change affects the hemispheric white matter. No large vessel territory infarction. No hydrocephalus or extra-axial collection. Vascular: Major vessels at the base of the brain show flow. Skull and upper cervical spine: Negative Sinuses/Orbits: Clear/normal Other: None IMPRESSION: 6.5 mm ring-enhancing lesion of the left lateral cerebellum with some hemosiderin/blood products, consistent with a solitary metastasis. Minimal adjacent edema. No second lesion identified. Electronically Signed   By: MaNelson Chimes.D.   On: 01/27/2021 14:01   IR IMAGING GUIDED PORT INSERTION  Result Date: 02/06/2021 INDICATION: [age]ear old male with large cell neuroendocrine tumor of the right lung. He presents for port catheter placement to establish durable venous access. EXAM: IMPLANTED PORT A CATH PLACEMENT WITH ULTRASOUND AND FLUOROSCOPIC GUIDANCE MEDICATIONS: None. ANESTHESIA/SEDATION: Versed 2 mg IV; Fentanyl 100 mcg IV; Moderate Sedation Time:  23 minutes The patient was continuously monitored during the procedure by the interventional radiology nurse under my direct supervision. FLUOROSCOPY TIME:  0 minutes, 18 seconds (6 mGy) COMPLICATIONS: None immediate. PROCEDURE: The right neck and chest was prepped with chlorhexidine, and draped in the usual sterile fashion using maximum barrier technique (cap and mask, sterile gown, sterile gloves, large sterile sheet, hand hygiene and cutaneous antiseptic). Local anesthesia was attained by infiltration with 1% lidocaine with epinephrine. Ultrasound demonstrated patency of the right internal jugular vein, and this was documented with an image. Under real-time ultrasound guidance, this vein was accessed with a 21 gauge micropuncture needle and image documentation was performed. A small dermatotomy  was made at the access site with an 11 scalpel. A 0.018" wire was advanced into the SVC and the access needle exchanged for a 69F micropuncture vascular sheath. The 0.018" wire was then removed and a 0.035" wire advanced into the IVC. An appropriate location for the subcutaneous reservoir was selected below the clavicle and an incision was made through the skin and underlying soft tissues. The subcutaneous tissues were then dissected using a combination of blunt and sharp surgical technique and  a pocket was formed. A single lumen power injectable portacatheter was then tunneled through the subcutaneous tissues from the pocket to the dermatotomy and the port reservoir placed within the subcutaneous pocket. The venous access site was then serially dilated and a peel away vascular sheath placed over the wire. The wire was removed and the port catheter advanced into position under fluoroscopic guidance. The catheter tip is positioned in the superior cavoatrial junction. This was documented with a spot image. The portacatheter was then tested and found to flush and aspirate well. The port was flushed with saline followed by 100 units/mL heparinized saline. The pocket was then closed in two layers using first subdermal inverted interrupted absorbable sutures followed by a running subcuticular suture. The epidermis was then sealed with Dermabond. The dermatotomy at the venous access site was also closed with Dermabond. IMPRESSION: Successful placement of a right IJ approach Power Port with ultrasound and fluoroscopic guidance. The catheter is ready for use. Electronically Signed   By: Jacqulynn Cadet M.D.   On: 02/06/2021 14:33   LONG TERM MONITOR-LIVE TELEMETRY (3-14 DAYS)  Result Date: 02/09/2021  Normal sinus rhythm with rare PACs and PVCs.  Brief, string of PACs not associated with symptoms.  No sustained pathologic arrhythmias.  Patch Wear Time:  14 days and 0 hours (2022-05-14T12:06:36-0400 to  2022-05-28T12:06:36-0400) Patient had a min HR of 45 bpm, max HR of 203 bpm, and avg HR of 82 bpm. Predominant underlying rhythm was Sinus Rhythm. 1 run of Supraventricular Tachycardia occurred lasting 6 beats with a max rate of 203 bpm (avg 199 bpm). Isolated SVEs were rare (<1.0%), SVE Couplets were rare (<1.0%), and SVE Triplets were rare (<1.0%). Isolated VEs were rare (<1.0%), and no VE Couplets or VE Triplets were present.    ASSESSMENT AND PLAN: This is a very pleasant 65 years old white male recently diagnosed with a stage IV (T1c, N1, M1 B) large cell neuroendocrine carcinoma presented with right upper lobe lung nodule in addition to right hilar adenopathy in April 2022 and the patient was found to have solitary brain metastasis in May 2022 He is status post right upper lobectomy with lymph node sampling under the care of Dr. Kipp Brood. The patient is currently undergoing systemic chemotherapy with cisplatin 80 mg/M2 on day 1 and etoposide 100 Mg/M2 on days 1, 2 and 3 every 3 weeks.  He is starting the first cycle of his treatment today. He is also undergoing SRS to the solitary brain metastasis. He presented today complaining of right flank pain of unclear etiology.  It could be referred pain from his surgical scar but also underlying kidney stone or urinary tract infection could not be ruled out completely. I recommended for the patient to have urinalysis in addition to abdominal x-ray to rule out kidney stone. I started the patient empirically on Percocet 5/325 mg p.o. every 8 hours as needed for pain. I encouraged the patient to increase his oral hydration. I also advised the patient to go immediately to the emergency department if he has no improvement of his condition. He will come back for follow-up visit next week for evaluation and management of any adverse effect of his treatment. The patient was advised to call if he has any other concerning symptoms in the interval. The patient  voices understanding of current disease status and treatment options and is in agreement with the current care plan.  All questions were answered. The patient knows to call the clinic with any problems, questions  or concerns. We can certainly see the patient much sooner if necessary.  The total time spent in the appointment was 30 minutes.  Disclaimer: This note was dictated with voice recognition software. Similar sounding words can inadvertently be transcribed and may not be corrected upon review.

## 2021-02-09 NOTE — Progress Notes (Signed)
Received call from patient after receiving my card per my request.  Introduced myself as Arboriculturist and to offer available resources.  Discussed one-time $1000 Radio broadcast assistant to assist with personal expenses while going through treatment. Patient states he has 2 insurances and  is in pretty good shape financially and would rather money go to someone in need.  He has my card for any additional financial questions or concerns.

## 2021-02-09 NOTE — Patient Instructions (Signed)
[facility]  Discharge Instructions: Thank you for choosing [facility] to provide your oncology and hematology care.   If you have a lab appointment with the [facility], please go directly to the [facility] and check in at the registration area.   Wear comfortable clothing and clothing appropriate for easy access to any Portacath or PICC line.   We strive to give you quality time with your provider. You may need to reschedule your appointment if you arrive late (15 or more minutes).  Arriving late affects you and other patients whose appointments are after yours.  Also, if you miss three or more appointments without notifying the office, you may be dismissed from the clinic at the provider's discretion.      For prescription refill requests, have your pharmacy contact our office and allow 72 hours for refills to be completed.    Today you received the following chemotherapy and/or immunotherapy agents Cisplatin and Etoposide      To help prevent nausea and vomiting after your treatment, we encourage you to take your nausea medication as directed.  BELOW ARE SYMPTOMS THAT SHOULD BE REPORTED IMMEDIATELY: *FEVER GREATER THAN 100.4 F (38 C) OR HIGHER *CHILLS OR SWEATING *NAUSEA AND VOMITING THAT IS NOT CONTROLLED WITH YOUR NAUSEA MEDICATION *UNUSUAL SHORTNESS OF BREATH *UNUSUAL BRUISING OR BLEEDING *URINARY PROBLEMS (pain or burning when urinating, or frequent urination) *BOWEL PROBLEMS (unusual diarrhea, constipation, pain near the anus) TENDERNESS IN MOUTH AND THROAT WITH OR WITHOUT PRESENCE OF ULCERS (sore throat, sores in mouth, or a toothache) UNUSUAL RASH, SWELLING OR PAIN  UNUSUAL VAGINAL DISCHARGE OR ITCHING   Items with * indicate a potential emergency and should be followed up as soon as possible or go to the Emergency Department if any problems should occur.  Please show the CHEMOTHERAPY ALERT CARD or IMMUNOTHERAPY ALERT CARD at  check-in to the Emergency Department and triage nurse.  Should you have questions after your visit or need to cancel or reschedule your appointment, please contact [facility]  Dept: [phone]  and follow the prompts.  Office hours are 8:00 a.m. to 4:30 p.m. Monday - Friday. Please note that voicemails left after 4:00 p.m. may not be returned until the following business day.  We are closed weekends and major holidays. You have access to a nurse at all times for urgent questions. Please call the main number to the clinic Dept: [phone] and follow the prompts.   For any non-urgent questions, you may also contact your provider using MyChart. We now offer e-Visits for anyone 21 and older to request care online for non-urgent symptoms. For details visit [email or web address].   Also download the MyChart app! Go to the app store, search "MyChart", open the app, select [facility], and log in with your MyChart username and password.  Due to Covid, a mask is required upon entering the hospital/clinic. If you do not have a mask, one will be given to you upon arrival. For doctor visits, patients may have 1 support person aged 24 or older with them. For treatment visits, patients cannot have anyone with them due to current Covid guidelines and our immunocompromised population.   Etoposide, VP-16 injection What is this medication? ETOPOSIDE, VP-16 (e toe POE side) is a chemotherapy drug. It is used to treattesticular cancer, lung cancer, and other cancers. This medicine may be used for other purposes; ask your health care provider orpharmacist if you have questions. COMMON  BRAND NAME(S): Etopophos, Toposar, VePesid What should I tell my care team before I take this medication? They need to know if you have any of these conditions: infection kidney disease liver disease low blood counts, like low white cell, platelet, or red cell counts an unusual or allergic reaction  to etoposide, other medicines, foods, dyes, or preservatives pregnant or trying to get pregnant breast-feeding How should I use this medication? This medicine is for infusion into a vein. It is administered in a hospital orclinic by a specially trained health care professional. Talk to your pediatrician regarding the use of this medicine in children.Special care may be needed. Overdosage: If you think you have taken too much of this medicine contact apoison control center or emergency room at once. NOTE: This medicine is only for you. Do not share this medicine with others. What if I miss a dose? It is important not to miss your dose. Call your doctor or health careprofessional if you are unable to keep an appointment. What may interact with this medication? This medicine may interact with the following medications: warfarin This list may not describe all possible interactions. Give your health care provider a list of all the medicines, herbs, non-prescription drugs, or dietary supplements you use. Also tell them if you smoke, drink alcohol, or use illegaldrugs. Some items may interact with your medicine. What should I watch for while using this medication? Visit your doctor for checks on your progress. This drug may make you feel generally unwell. This is not uncommon, as chemotherapy can affect healthy cells as well as cancer cells. Report any side effects. Continue your course oftreatment even though you feel ill unless your doctor tells you to stop. In some cases, you may be given additional medicines to help with side effects.Follow all directions for their use. Call your doctor or health care professional for advice if you get a fever, chills or sore throat, or other symptoms of a cold or flu. Do not treat yourself. This drug decreases your body's ability to fight infections. Try toavoid being around people who are sick. This medicine may increase your risk to bruise or bleed. Call your  doctor orhealth care professional if you notice any unusual bleeding. Talk to your doctor about your risk of cancer. You may be more at risk forcertain types of cancers if you take this medicine. Do not become pregnant while taking this medicine or for at least 6 months after stopping it. Women should inform their doctor if they wish to become pregnant or think they might be pregnant. Women of child-bearing potential will need to have a negative pregnancy test before starting this medicine. There is a potential for serious side effects to an unborn child. Talk to your health care professional or pharmacist for more information. Do not breast-feed aninfant while taking this medicine. Men must use a latex condom during sexual contact with a woman while taking this medicine and for at least 4 months after stopping it. A latex condom is needed even if you have had a vasectomy. Contact your doctor right away if your partner becomes pregnant. Do not donate sperm while taking this medicine and for at least 4 months after you stop taking this medicine. Men should inform their doctors if they wish to father a child. This medicine may lower spermcounts. What side effects may I notice from receiving this medication? Side effects that you should report to your doctor or health care professionalas soon as possible: allergic reactions like  skin rash, itching or hives, swelling of the face, lips, or tongue low blood counts - this medicine may decrease the number of white blood cells, red blood cells, and platelets. You may be at increased risk for infections and bleeding nausea, vomiting redness, blistering, peeling or loosening of the skin, including inside the mouth signs and symptoms of infection like fever; chills; cough; sore throat; pain or trouble passing urine signs and symptoms of low red blood cells or anemia such as unusually weak or tired; feeling faint or lightheaded; falls; breathing problems unusual  bruising or bleeding Side effects that usually do not require medical attention (report to yourdoctor or health care professional if they continue or are bothersome): changes in taste diarrhea hair loss loss of appetite mouth sores This list may not describe all possible side effects. Call your doctor for medical advice about side effects. You may report side effects to FDA at1-800-FDA-1088. Where should I keep my medication? This drug is given in a hospital or clinic and will not be stored at home. NOTE: This sheet is a summary. It may not cover all possible information. If you have questions about this medicine, talk to your doctor, pharmacist, orhealth care provider.  2022 Elsevier/Gold Standard (2018-10-11 16:57:15)  Cisplatin injection What is this medication? CISPLATIN (SIS pla tin) is a chemotherapy drug. It targets fast dividing cells, like cancer cells, and causes these cells to die. This medicine is used totreat many types of cancer like bladder, ovarian, and testicular cancers. This medicine may be used for other purposes; ask your health care provider orpharmacist if you have questions. COMMON BRAND NAME(S): Platinol, Platinol -AQ What should I tell my care team before I take this medication? They need to know if you have any of these conditions: eye disease, vision problems hearing problems kidney disease low blood counts, like white cells, platelets, or red blood cells tingling of the fingers or toes, or other nerve disorder an unusual or allergic reaction to cisplatin, carboplatin, oxaliplatin, other medicines, foods, dyes, or preservatives pregnant or trying to get pregnant breast-feeding How should I use this medication? This drug is given as an infusion into a vein. It is administered in a hospitalor clinic by a specially trained health care professional. Talk to your pediatrician regarding the use of this medicine in children.Special care may be needed. Overdosage:  If you think you have taken too much of this medicine contact apoison control center or emergency room at once. NOTE: This medicine is only for you. Do not share this medicine with others. What if I miss a dose? It is important not to miss a dose. Call your doctor or health careprofessional if you are unable to keep an appointment. What may interact with this medication? This medicine may interact with the following medications: foscarnet certain antibiotics like amikacin, gentamicin, neomycin, polymyxin B, streptomycin, tobramycin, vancomycin This list may not describe all possible interactions. Give your health care provider a list of all the medicines, herbs, non-prescription drugs, or dietary supplements you use. Also tell them if you smoke, drink alcohol, or use illegaldrugs. Some items may interact with your medicine. What should I watch for while using this medication? Your condition will be monitored carefully while you are receiving this medicine. You will need important blood work done while you are taking thismedicine. This drug may make you feel generally unwell. This is not uncommon, as chemotherapy can affect healthy cells as well as cancer cells. Report any side effects. Continue your course  of treatment even though you feel ill unless yourdoctor tells you to stop. This medicine may increase your risk of getting an infection. Call your healthcare professional for advice if you get a fever, chills, or sore throat, or other symptoms of a cold or flu. Do not treat yourself. Try to avoid beingaround people who are sick. Avoid taking medicines that contain aspirin, acetaminophen, ibuprofen, naproxen, or ketoprofen unless instructed by your healthcare professional.These medicines may hide a fever. This medicine may increase your risk to bruise or bleed. Call your doctor orhealth care professional if you notice any unusual bleeding. Be careful brushing and flossing your teeth or using a  toothpick because you may get an infection or bleed more easily. If you have any dental work done,tell your dentist you are receiving this medicine. Do not become pregnant while taking this medicine or for 14 months after stopping it. Women should inform their healthcare professional if they wish to become pregnant or think they might be pregnant. Men should not father a child while taking this medicine and for 11 months after stopping it. There is potential for serious side effects to an unborn child. Talk to your healthcareprofessional for more information. Do not breast-feed an infant while taking this medicine. This medicine has caused ovarian failure in some women. This medicine may make it more difficult to get pregnant. Talk to your healthcare professional if [place] concerned about your fertility. This medicine has caused decreased sperm counts in some men. This may make it more difficult to father a child. Talk to your healthcare professional if [place] concerned about your fertility. Drink fluids as directed while you are taking this medicine. This will helpprotect your kidneys. Call your doctor or health care professional if you get diarrhea. Do not treatyourself. What side effects may I notice from receiving this medication? Side effects that you should report to your doctor or health care professionalas soon as possible: allergic reactions like skin rash, itching or hives, swelling of the face, lips, or tongue blurred vision changes in vision decreased hearing or ringing of the ears nausea, vomiting pain, redness, or irritation at site where injected pain, tingling, numbness in the hands or feet signs and symptoms of bleeding such as bloody or black, tarry stools; red or dark brown urine; spitting up blood or brown material that looks like coffee grounds; red spots on the skin; unusual bruising or bleeding from the eyes, gums, or nose signs and symptoms of infection like fever; chills;  cough; sore throat; pain or trouble passing urine signs and symptoms of kidney injury like trouble passing urine or change in the amount of urine signs and symptoms of low red blood cells or anemia such as unusually weak or tired; feeling faint or lightheaded; falls; breathing problems Side effects that usually do not require medical attention (report to yourdoctor or health care professional if they continue or are bothersome): loss of appetite mouth sores muscle cramps This list may not describe all possible side effects. Call your doctor for medical advice about side effects. You may report side effects to FDA at1-800-FDA-1088. Where should I keep my medication? This drug is given in a hospital or clinic and will not be stored at home. NOTE: This sheet is a summary. It may not cover all possible information. If you have questions about this medicine, talk to your doctor, pharmacist, orhealth care provider.  2022 Elsevier/Gold Standard (2018-08-11 15:59:17)

## 2021-02-10 ENCOUNTER — Ambulatory Visit (HOSPITAL_COMMUNITY): Payer: Medicare Other | Admitting: Nurse Practitioner

## 2021-02-10 ENCOUNTER — Emergency Department (HOSPITAL_BASED_OUTPATIENT_CLINIC_OR_DEPARTMENT_OTHER): Payer: Medicare Other

## 2021-02-10 ENCOUNTER — Other Ambulatory Visit: Payer: Self-pay

## 2021-02-10 ENCOUNTER — Inpatient Hospital Stay: Payer: Medicare Other

## 2021-02-10 ENCOUNTER — Encounter (HOSPITAL_BASED_OUTPATIENT_CLINIC_OR_DEPARTMENT_OTHER): Payer: Self-pay | Admitting: *Deleted

## 2021-02-10 ENCOUNTER — Emergency Department (HOSPITAL_BASED_OUTPATIENT_CLINIC_OR_DEPARTMENT_OTHER)
Admission: EM | Admit: 2021-02-10 | Discharge: 2021-02-11 | Disposition: A | Discharge status: Home / Self Care | Payer: Medicare Other | Attending: Emergency Medicine | Admitting: Emergency Medicine

## 2021-02-10 ENCOUNTER — Ambulatory Visit (HOSPITAL_COMMUNITY)
Admission: RE | Admit: 2021-02-10 | Discharge: 2021-02-10 | Disposition: A | Discharge status: Home / Self Care | Payer: Medicare Other | Source: Ambulatory Visit | Attending: Internal Medicine | Admitting: Internal Medicine

## 2021-02-10 VITALS — BP 137/94 | HR 85 | Temp 98.1°F | Resp 17

## 2021-02-10 DIAGNOSIS — C7A1 Malignant poorly differentiated neuroendocrine tumors: Secondary | ICD-10-CM | POA: Insufficient documentation

## 2021-02-10 DIAGNOSIS — Z79899 Other long term (current) drug therapy: Secondary | ICD-10-CM | POA: Insufficient documentation

## 2021-02-10 DIAGNOSIS — I10 Essential (primary) hypertension: Secondary | ICD-10-CM | POA: Insufficient documentation

## 2021-02-10 DIAGNOSIS — N12 Tubulo-interstitial nephritis, not specified as acute or chronic: Secondary | ICD-10-CM | POA: Diagnosis not present

## 2021-02-10 DIAGNOSIS — Z951 Presence of aortocoronary bypass graft: Secondary | ICD-10-CM | POA: Insufficient documentation

## 2021-02-10 DIAGNOSIS — Z7982 Long term (current) use of aspirin: Secondary | ICD-10-CM | POA: Diagnosis not present

## 2021-02-10 DIAGNOSIS — I25111 Atherosclerotic heart disease of native coronary artery with angina pectoris with documented spasm: Secondary | ICD-10-CM | POA: Insufficient documentation

## 2021-02-10 DIAGNOSIS — Z85841 Personal history of malignant neoplasm of brain: Secondary | ICD-10-CM | POA: Insufficient documentation

## 2021-02-10 DIAGNOSIS — Z7902 Long term (current) use of antithrombotics/antiplatelets: Secondary | ICD-10-CM | POA: Insufficient documentation

## 2021-02-10 DIAGNOSIS — R1011 Right upper quadrant pain: Secondary | ICD-10-CM | POA: Diagnosis present

## 2021-02-10 DIAGNOSIS — R109 Unspecified abdominal pain: Secondary | ICD-10-CM

## 2021-02-10 DIAGNOSIS — Z87891 Personal history of nicotine dependence: Secondary | ICD-10-CM | POA: Diagnosis not present

## 2021-02-10 DIAGNOSIS — Z85118 Personal history of other malignant neoplasm of bronchus and lung: Secondary | ICD-10-CM | POA: Insufficient documentation

## 2021-02-10 DIAGNOSIS — Z5111 Encounter for antineoplastic chemotherapy: Secondary | ICD-10-CM | POA: Diagnosis not present

## 2021-02-10 MED ORDER — DEXAMETHASONE SODIUM PHOSPHATE 100 MG/10ML IJ SOLN
10.0000 mg | Freq: Once | INTRAMUSCULAR | Status: AC
  Administered 2021-02-10: 10 mg via INTRAVENOUS
  Filled 2021-02-10: qty 10

## 2021-02-10 MED ORDER — MORPHINE SULFATE (PF) 4 MG/ML IV SOLN
4.0000 mg | Freq: Once | INTRAVENOUS | Status: AC
  Administered 2021-02-10: 4 mg via INTRAVENOUS
  Filled 2021-02-10: qty 1

## 2021-02-10 MED ORDER — SODIUM CHLORIDE 0.9% FLUSH
10.0000 mL | INTRAVENOUS | Status: DC | PRN
Start: 2021-02-10 — End: 2021-02-10
  Administered 2021-02-10: 10 mL
  Filled 2021-02-10: qty 10

## 2021-02-10 MED ORDER — HEPARIN SOD (PORK) LOCK FLUSH 100 UNIT/ML IV SOLN
500.0000 [IU] | Freq: Once | INTRAVENOUS | Status: AC | PRN
  Administered 2021-02-10: 500 [IU]
  Filled 2021-02-10: qty 5

## 2021-02-10 MED ORDER — PROCHLORPERAZINE EDISYLATE 10 MG/2ML IJ SOLN
10.0000 mg | Freq: Once | INTRAMUSCULAR | Status: AC
  Administered 2021-02-10: 10 mg via INTRAVENOUS
  Filled 2021-02-10: qty 2

## 2021-02-10 MED ORDER — SODIUM CHLORIDE 0.9 % IV SOLN
120.0000 mg/m2 | Freq: Once | INTRAVENOUS | Status: AC
  Administered 2021-02-10: 250 mg via INTRAVENOUS
  Filled 2021-02-10: qty 12.5

## 2021-02-10 MED ORDER — SODIUM CHLORIDE 0.9 % IV SOLN
Freq: Once | INTRAVENOUS | Status: AC
  Filled 2021-02-10: qty 250

## 2021-02-10 NOTE — ED Triage Notes (Signed)
States he has cancer and went for his first Chemo medication yesterday. Pain in his right hip during the tx. His MD gave him a Rx for Percocet.

## 2021-02-10 NOTE — ED Notes (Signed)
ED Provider at bedside. 

## 2021-02-10 NOTE — ED Notes (Signed)
Pt states right flank pain that started during first chemo treatment yesterday, states onologist gave him rx for percocet yesterday but not relieving pain, denies any urinary symptoms or issues with bowels moving.  Xray done today at 1 pm at [facility].  Pt states pain is 6/10, last percocet at 1800.  Denies vomiting but states nausea.

## 2021-02-10 NOTE — ED Provider Notes (Signed)
[facility] EMERGENCY DEPARTMENT Provider Note   CSN: [id number] Arrival date & time: 02/10/21  2234     History Chief Complaint  Patient presents with   Flank Pain    [name] is a 65 y.o. male.  The history is provided by the patient and medical records. No language interpreter was used.  Flank Pain This is a new problem. The current episode started 2 days ago. The problem occurs constantly. The problem has not changed since onset.Associated symptoms include abdominal pain. Pertinent negatives include no chest pain, no headaches and no shortness of breath. Nothing aggravates the symptoms. Nothing relieves the symptoms. He has tried nothing for the symptoms. The treatment provided no relief.      Past Medical History:  Diagnosis Date   Anxiety    Arthritis    Basal cell carcinoma (BCC) of forehead    CAD (coronary artery disease)    a. 10/2015 ant STEMI >> LHC with 3 v CAD; oLAD tx with POBA >> emergent CABG. b. Multiple evals since that time, early graft failure of SVG-RCA by cath 03/2016. c. 2/19 PCI/DES x1 to pRCA, normal EF.   Carotid artery disease ([place])    a. 40-59% BICA 02/2018.   Depression    Dyspnea    Ectopic atrial tachycardia (HCC)    Esophageal reflux    eosinophil esophagitis   Family history of adverse reaction to anesthesia    "sister has PONV" (06/21/2017)   Former tobacco use    Gout    Hepatitis C    "treated and cured" (06/21/2017)   High cholesterol    History of kidney stones    Hypertension    Ischemic cardiomyopathy    a. EF 25-30% at intraop TEE 4/17  //  b. Limited Echo 5/17 - EF 45-50%, mild ant HK. c. EF 55-65% by cath 09/2017.   Migraine    "3-4/yr" (06/21/2017)   Myocardial infarction ([place]) 10/2015   Palpitations    Sinus bradycardia    a. HR dropping into 40s in 02/2016 -> BB reduced.   Stroke [facility]) 10/2016   "small one; sometimes my memory/cognitive issues" (06/21/2017)   Symptomatic hypotension    a. 02/2016 ER  visit -> meds reduced.   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   Acute back pain with sciatica 01/12/2021   Allergic rhinitis 01/12/2021   Amnesia 01/12/2021   Kidney stone 01/12/2021   Sciatica 01/12/2021   Bipolar disorder ([place]) 01/12/2021   S/P lobectomy of lung 12/24/2020   Solitary lung nodule 11/28/2020   Chest pain 11/07/2019   Gastroesophageal reflux disease    Cervical radiculopathy 10/17/2018   Preoperative clearance 10/04/2018   Palpitations    Coronary artery disease involving coronary bypass graft of native heart with angina pectoris ([place])    Transient loss of consciousness 03/24/2018   Ectopic atrial tachycardia ([place]) 02/09/2018   Central chest pain 03/10/2017   Family hx-stroke 11/10/2016   Stroke-like episode ([place]) - R brain, s/p tPA 11/09/2016   Unstable angina ([place]) 09/07/2016   Claudication of both lower extremities ([place])    Pure  hypercholesterolemia    Tobacco abuse disorder    CAD of autologous artery bypass graft without angina    Chest pain at rest 06/10/2016   Abnormal nuclear stress test - HIGH RISK 04/20/2016   Old MI (myocardial infarction)    Essential hypertension 02/26/2016   Ischemic cardiomyopathy 12/25/2015   Hyperlipidemia LDL goal <70 12/25/2015   Mild tobacco abuse in early remission 11/28/2015   Coronary artery disease involving native coronary artery of native heart with angina pectoris ([place]) 11/28/2015   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   Acute MI anterior wall first episode care [facility])    Precordial chest pain 03/07/2015   Dysphagia 03/07/2015   Gout attack 03/07/2015    Mixed bipolar I disorder ([place]) 03/07/2015   Fibromyalgia 07/09/2014   Gout 07/09/2014   Anxiety 07/09/2014   Depression 07/09/2014   Hepatitis C 58/04/9832   Eosinophilic esophagitis 82/50/5397    Past Surgical History:  Procedure Laterality Date   25 GAUGE PARS PLANA VITRECTOMY WITH 20 GAUGE MVR PORT  12/31/2020   Procedure: 25 GAUGE PARS PLANA VITRECTOMY WITH 20 GAUGE MVR PORT;  Surgeon: [name], MD;  [facility];  Service: Thoracic;;   ANTERIOR CERVICAL DECOMP/DISCECTOMY FUSION N/A 10/17/2018   Procedure: Anterior Cervical Decompression Fusion - Cervical seven -Thoracic one;  Surgeon: Consuella Lose, MD;  Location: [facility];  Service: Neurosurgery;  Laterality: N/A;   BASAL CELL CARCINOMA EXCISION     "forehead   BIOPSY  07/20/2019   Procedure: BIOPSY;  Surgeon: Carol Ada, MD;  [facility]: [facility];  Service: Endoscopy;;   BRONCHIAL BIOPSY  12/24/2020   Procedure: BRONCHIAL BIOPSIES;  Surgeon: Garner Nash, DO;  Location: [place] ENDOSCOPY;  Service: Pulmonary;;   BRONCHIAL BRUSHINGS  12/24/2020   Procedure: BRONCHIAL BRUSHINGS;  Surgeon: Garner Nash, DO;  Location: [place];  Service: Pulmonary;;   BRONCHIAL NEEDLE ASPIRATION BIOPSY  12/24/2020   Procedure: BRONCHIAL NEEDLE ASPIRATION BIOPSIES;  Surgeon: Garner Nash, DO;  Location: [facility];  Service: Pulmonary;;   CARDIAC CATHETERIZATION N/A 11/28/2015   Procedure: Left Heart Cath and Coronary Angiography;  Surgeon: [name], MD;  Location: [place] CV LAB;  Service: Cardiovascular;  Laterality: N/A;   CARDIAC CATHETERIZATION N/A 11/28/2015   Procedure: Coronary Balloon Angioplasty;  Surgeon: [name], MD;  Location: [place] CV LAB;  Service: Cardiovascular;  Laterality: N/A;  ostial LAD   CARDIAC CATHETERIZATION N/A 11/28/2015   Procedure: Coronary/Graft Angiography;  Surgeon: [name], MD;  Location: [place] CV LAB;  Service: Cardiovascular;   Laterality: N/A;  coronaries only    CARDIAC CATHETERIZATION N/A 04/21/2016   Procedure: Left Heart Cath and Coronary Angiography;  Surgeon: Wellington Hampshire, MD;  Location: [place] CV LAB;  Service: Cardiovascular;  Laterality: N/A;   CARDIAC CATHETERIZATION N/A 06/14/2016   Procedure: Left Heart Cath and Cors/Grafts Angiography;  Surgeon: Lorretta Harp, MD;  Location: [place] CV LAB;  Service: Cardiovascular;  Laterality: N/A;   CARDIAC CATHETERIZATION N/A 09/08/2016   Procedure: Left Heart Cath and Cors/Grafts Angiography;  Surgeon: Wellington Hampshire, MD;  Location: [place] CV LAB;  Service: Cardiovascular;  Laterality: N/A;   CARDIAC CATHETERIZATION     CORONARY ARTERY BYPASS GRAFT N/A 11/28/2015   Procedure: CORONARY ARTERY BYPASS GRAFTING (CABG) TIMES FIVE USING LEFT INTERNAL MAMMARY ARTERY AND RIGHT GREATER SAPHENOUS,VIEN HARVEATED BY ENDOVIEN, INTRAOPPRATIVE TEE;  Surgeon: Gaye Pollack, MD;  Location: [facility];  Service: Open Heart Surgery;  Laterality: N/A;   CORONARY STENT INTERVENTION N/A 10/05/2017   Procedure: CORONARY STENT INTERVENTION;  Surgeon: [name], MD;  Location: [place] CV LAB;  Service: Cardiovascular;  Laterality: N/A;   ESOPHAGOGASTRODUODENOSCOPY (EGD) WITH PROPOFOL N/A 07/20/2019   Procedure: ESOPHAGOGASTRODUODENOSCOPY (EGD) WITH PROPOFOL;  Surgeon: Carol Ada, MD;  [facility];  Service: Endoscopy;  Laterality: N/A;   ESOPHAGOGASTRODUODENOSCOPY (EGD) WITH PROPOFOL N/A 01/30/2021   Procedure: ESOPHAGOGASTRODUODENOSCOPY (EGD) WITH PROPOFOL;  Surgeon: Carol Ada, MD;  [facility];  Service: Endoscopy;  Laterality: N/A;   FIDUCIAL MARKER PLACEMENT  12/24/2020   Procedure: FIDUCIAL DYE MARKER PLACEMENT;  Surgeon: Garner Nash, DO;  Location: [place] ENDOSCOPY;  Service: Pulmonary;;   HUMERUS SURGERY Right 1969   "tumor inside bone; filled it w/bone chips"   INTERCOSTAL NERVE BLOCK Right 12/24/2020   Procedure: INTERCOSTAL  NERVE BLOCK;  Surgeon: [name], MD;  Location: [place];  Service: Thoracic;  Laterality: Right;   IR IMAGING GUIDED PORT INSERTION  02/06/2021   LEFT HEART CATH AND CORS/GRAFTS ANGIOGRAPHY N/A 03/11/2017   Procedure: Left Heart Cath and Cors/Grafts Angiography;  Surgeon: Leonie Man, MD;  Location: [place] CV LAB;  Service: Cardiovascular;  Laterality: N/A;   LEFT HEART CATH AND CORS/GRAFTS ANGIOGRAPHY N/A 10/05/2017   Procedure: LEFT HEART CATH AND CORS/GRAFTS ANGIOGRAPHY;  Surgeon: [name], MD;  Location: [place] CV LAB;  Service: Cardiovascular;  Laterality: N/A;   LEFT HEART CATH AND CORS/GRAFTS ANGIOGRAPHY N/A 04/11/2019   Procedure: LEFT HEART CATH AND CORS/GRAFTS ANGIOGRAPHY;  Surgeon: [name], MD;  Location: [place] CV LAB;  Service: Cardiovascular;  Laterality: N/A;   NODE DISSECTION Right 12/24/2020   Procedure: NODE DISSECTION;  Surgeon: [name], MD;  Location: [facility];  Service: Thoracic;  Laterality: Right;   PERIPHERAL VASCULAR CATHETERIZATION N/A 06/14/2016   Procedure: Lower Extremity Angiography;  Surgeon: Lorretta Harp, MD;  Location: [place] CV LAB;  Service: Cardiovascular;  Laterality: N/A;   VIDEO BRONCHOSCOPY WITH ENDOBRONCHIAL NAVIGATION Right 12/24/2020   Procedure: VIDEO BRONCHOSCOPY WITH ENDOBRONCHIAL NAVIGATION;  Surgeon: Garner Nash, DO;  Location: [place];  Service: Pulmonary;  Laterality: Right;   VIDEO BRONCHOSCOPY WITH ENDOBRONCHIAL ULTRASOUND N/A 12/24/2020   Procedure: VIDEO BRONCHOSCOPY WITH ENDOBRONCHIAL ULTRASOUND;  Surgeon: Garner Nash, DO;  Location: [place];  Service: Pulmonary;  Laterality: N/A;   VIDEO BRONCHOSCOPY WITH INSERTION OF INTERBRONCHIAL VALVE (IBV) N/A 12/31/2020   Procedure: VIDEO BRONCHOSCOPY WITH INSERTION OF INTERBRONCHIAL VALVE (IBV).VALVE IN CARTRIDGE 73mm,9mm. CHEST TUBE PLACEMENT.;  Surgeon: [name], MD;  [facility];  Service: Thoracic;   Laterality: N/A;       Family History  Problem Relation Age of Onset   Lung cancer Mother    Heart Problems Father    Heart attack Father 78   Stroke Father    Heart failure Father    Heart attack Maternal Grandmother    Stroke Maternal Grandmother    Heart attack Paternal Uncle    Hypertension Brother    Autoimmune disease Neg Hx     Social History   Tobacco Use   Smoking status: Former    Packs/day: 0.75    Years: 44.00    Pack years: 33.00    Types: Cigarettes    Quit date: 11/28/2015    Years since quitting: 5.2   Smokeless tobacco: Never  Vaping Use   Vaping Use: Never used  Substance Use Topics   Alcohol use:  Yes    Comment: occ   Drug use: No    Comment: 06/21/2017 "nothing since the 1980s"    Home Medications Prior to Admission medications   Medication Sig Start Date End Date Taking? Authorizing Provider  albuterol (VENTOLIN HFA) 108 (90 Base) MCG/ACT inhaler Inhale 2 puffs into the lungs every 6 (six) hours as needed for wheezing or shortness of breath. 01/12/21   Garner Nash, DO  amiodarone (PACERONE) 200 MG tablet Take 1 tablet (200 mg total) by mouth daily. Patient taking differently: Take 200 mg by mouth in the morning. 01/07/21   Elgie Collard, PA-C  amLODipine (NORVASC) 10 MG tablet TAKE 1 TABLET(10 MG) BY MOUTH DAILY Patient taking differently: Take 10 mg by mouth in the morning. 12/02/20   [name], MD  aspirin EC 81 MG tablet Take 81 mg by mouth in the morning. Swallow whole.    [provider]  atorvastatin (LIPITOR) 80 MG tablet TAKE 1 TABLET(80 MG) BY MOUTH DAILY 12/10/19   [name], MD  chlordiazePOXIDE (LIBRIUM) 10 MG capsule Take 10-20 mg by mouth 3 (three) times daily as needed for anxiety (sleep).    [provider]  cloNIDine (CATAPRES) 0.1 MG tablet Take 0.1 mg by mouth at bedtime. 04/09/20   [provider]  clopidogrel (PLAVIX) 75 MG tablet TAKE 1 TABLET BY MOUTH EVERY DAY Patient  taking differently: Take 75 mg by mouth in the morning. 02/06/20   [name], MD  fluticasone [facility]) 50 MCG/ACT nasal spray Place 2 sprays into both nostrils daily. Patient taking differently: Place 2 sprays into both nostrils daily as needed for allergies. 01/12/21   Garner Nash, DO  lidocaine-prilocaine (EMLA) cream Apply 1 application topically as needed. 01/30/21   Heilingoetter, Cassandra L, PA-C  metoprolol succinate (TOPROL-XL) 25 MG 24 hr tablet Take 1 tablet (25 mg total) by mouth daily. Patient taking differently: Take 25 mg by mouth in the morning. Additional if needed for increase pulse rate 08/27/20   [name], MD  Multiple Vitamin (MULTIVITAMIN WITH MINERALS) TABS tablet Take 1 tablet by mouth in the morning. Centrum    [provider]  nitroGLYCERIN (NITROSTAT) 0.4 MG SL tablet PLACE 1 TABLET UNDER THE TONGUE EVERY 5 MINUTES AS NEEDED FOR CHEST PAIN. 3 DOSES MAX Patient taking differently: Place 0.4 mg under the tongue every 5 (five) minutes x 3 doses as needed for chest pain. 3 DOSES MAX 04/16/20   [name], MD  oxyCODONE-acetaminophen (PERCOCET/ROXICET) 5-325 MG tablet Take 1 tablet by mouth every 8 (eight) hours as needed for severe pain. 02/09/21   Curt Bears, MD  prochlorperazine (COMPAZINE) 10 MG tablet Take 1 tablet (10 mg total) by mouth every 6 (six) hours as needed. Patient taking differently: Take 10 mg by mouth every 6 (six) hours as needed for nausea. 01/14/21   Heilingoetter, Cassandra L, PA-C  ranolazine (RANEXA) 1000 MG SR tablet Take 1 tablet (1,000 mg total) by mouth 2 (two) times daily. 11/08/19   [name], NP  Tiotropium Bromide-Olodaterol (STIOLTO RESPIMAT) 2.5-2.5 MCG/ACT AERS Inhale 2 puffs into the lungs daily. Patient taking differently: Inhale 1 puff into the lungs in the morning and at bedtime. 01/12/21   Garner Nash, DO    Allergies    Prednisone, Tetanus toxoids, Wellbutrin [bupropion],  Indomethacin, Morphine and related, Other, Scallops [shellfish allergy], and Chantix [varenicline]  Review of Systems   Review of Systems  Constitutional:  Negative for  chills, diaphoresis, fatigue and fever.  HENT:  Negative for congestion.   Respiratory:  Negative for cough, chest tightness, shortness of breath, wheezing and stridor.   Cardiovascular:  Negative for chest pain and palpitations.  Gastrointestinal:  Positive for abdominal pain and nausea. Negative for constipation, diarrhea and vomiting.  Genitourinary:  Positive for flank pain. Negative for dysuria and hematuria.  Musculoskeletal:  Positive for back pain. Negative for neck pain and neck stiffness.  Skin:  Positive for wound. Negative for rash.  Neurological:  Negative for light-headedness and headaches.  Psychiatric/Behavioral:  Negative for agitation and confusion.   All other systems reviewed and are negative.  Physical Exam Updated Vital Signs BP (!) 143/83 (BP Location: Left Arm)   Pulse 93   Temp 98.2 F (36.8 C) (Oral)   Resp 20   Ht 5\' 9"  (1.753 m)   Wt 88.5 kg   SpO2 99%   BMI 28.81 kg/m   Physical Exam Vitals and nursing note reviewed.  Constitutional:      General: He is not in acute distress.    Appearance: He is well-developed. He is not ill-appearing, toxic-appearing or diaphoretic.  HENT:     Head: Normocephalic and atraumatic.     Mouth/Throat:     Mouth: Mucous membranes are moist.  Eyes:     Conjunctiva/sclera: Conjunctivae normal.  Cardiovascular:     Rate and Rhythm: Normal rate and regular rhythm.     Heart sounds: No murmur heard. Pulmonary:     Effort: Pulmonary effort is normal. No respiratory distress.     Breath sounds: Normal breath sounds. No wheezing, rhonchi or rales.  Chest:     Chest wall: No tenderness.  Abdominal:     General: Abdomen is flat.     Palpations: Abdomen is soft.     Tenderness: There is abdominal tenderness in the right upper quadrant. There is no right  CVA tenderness, left CVA tenderness, guarding or rebound.    Musculoskeletal:        General: Tenderness present.     Cervical back: Neck supple. No tenderness.       Back:     Right lower leg: No edema.     Left lower leg: No edema.  Skin:    General: Skin is warm and dry.     Capillary Refill: Capillary refill takes less than 2 seconds.     Findings: No erythema.  Neurological:     General: No focal deficit present.     Mental Status: He is alert.  Psychiatric:        Mood and Affect: Mood normal.    ED Results / Procedures / Treatments   Labs (all labs ordered are listed, but only abnormal results are displayed) Labs Reviewed  CBC WITH DIFFERENTIAL/PLATELET - Abnormal; Notable for the following components:      Result Value   WBC 12.4 (*)    RBC 4.11 (*)    Hemoglobin 12.3 (*)    HCT 37.8 (*)    Neutro Abs 11.3 (*)    Abs Immature Granulocytes 0.08 (*)    All other components within normal limits  COMPREHENSIVE METABOLIC PANEL - Abnormal; Notable for the following components:   Glucose, Bld 136 (*)    All other components within normal limits  URINALYSIS, ROUTINE W REFLEX MICROSCOPIC - Abnormal; Notable for the following components:   Color, Urine STRAW (*)    Leukocytes,Ua SMALL (*)    All other components within normal limits  URINALYSIS, MICROSCOPIC (REFLEX) - Abnormal; Notable for the following components:   Bacteria, UA RARE (*)    All other components within normal limits  URINE CULTURE  LIPASE, BLOOD  LACTIC ACID, PLASMA  LACTIC ACID, PLASMA    EKG None  Radiology DG Abd 1 View  Result Date: 02/10/2021 CLINICAL DATA:  Right flank pain EXAM: ABDOMEN - 1 VIEW COMPARISON:  12/20/2018 FINDINGS: The bowel gas pattern is normal. No radio-opaque calculi or other significant radiographic abnormality are seen. Small right pleural effusion. Partially imaged port catheter at the cavoatrial junction. IMPRESSION: No urinary tract calculi identified. Electronically  Signed   By: Macy Mis M.D.   On: 02/10/2021 14:02   CT ABDOMEN PELVIS W CONTRAST  Result Date: 02/11/2021 CLINICAL DATA:  Right flank pain and right upper quadrant tenderness. Kidney stones suspected. Recent surgery for cancer removal. First chemotherapy yesterday. EXAM: CT ABDOMEN AND PELVIS WITH CONTRAST TECHNIQUE: Multidetector CT imaging of the abdomen and pelvis was performed using the standard protocol following bolus administration of intravenous contrast. CONTRAST:  141mL OMNIPAQUE IOHEXOL 300 MG/ML  SOLN COMPARISON:  11/18/2020 FINDINGS: Lower chest: Small right pleural effusion. Hepatobiliary: No focal liver abnormality is seen. No gallstones, gallbladder wall thickening, or biliary dilatation. Pancreas: Unremarkable. No pancreatic ductal dilatation or surrounding inflammatory changes. Spleen: Normal in size without focal abnormality. Adrenals/Urinary Tract: No adrenal gland nodules. Small cyst on the left kidney. Nephrograms are symmetrical. No hydronephrosis or hydroureter. No renal or ureteral stones identified. Bladder is unremarkable. Stomach/Bowel: Stomach, small bowel, and colon are not abnormally distended. No wall thickening or inflammatory changes are appreciated. Appendix is normal. Vascular/Lymphatic: Aortic atherosclerosis. No enlarged abdominal or pelvic lymph nodes. Reproductive: Prostate is unremarkable. Other: No free air or free fluid in the abdomen. Small focal subcutaneous emphysema in the anterior abdominal wall, likely injection site. No infiltration or collection in the subcutaneous fat to suggest postoperative complication. Musculoskeletal: No acute or significant osseous findings. IMPRESSION: 1. No renal or ureteral stone or obstruction. 2. No acute process demonstrated in the abdomen or pelvis. 3. Aortic atherosclerosis. 4. Small right pleural effusion. Electronically Signed   By: Lucienne Capers M.D.   On: 02/11/2021 01:13   US Abdomen Limited RUQ  (LIVER/GB)  Result Date: 02/11/2021 CLINICAL DATA:  Right upper quadrant pain EXAM: ULTRASOUND ABDOMEN LIMITED RIGHT UPPER QUADRANT COMPARISON:  10/24/2020 FINDINGS: Gallbladder: No gallstones or wall thickening visualized. No sonographic Murphy sign noted by sonographer. Common bile duct: Diameter: Normal caliber, 4 mm Liver: No focal lesion identified. Within normal limits in parenchymal echogenicity. Portal vein is patent on color Doppler imaging with normal direction of blood flow towards the liver. Other: None. IMPRESSION: No acute findings. Electronically Signed   By: Rolm Baptise M.D.   On: 02/11/2021 00:37    Procedures Procedures   Medications Ordered in ED Medications  morphine 4 MG/ML injection 4 mg (4 mg Intravenous Given 02/10/21 2355)  prochlorperazine (COMPAZINE) injection 10 mg (10 mg Intravenous Given 02/10/21 2356)  iohexol (OMNIPAQUE) 300 MG/ML solution 100 mL (100 mLs Intravenous Contrast Given 02/11/21 0100)  cephALEXin (KEFLEX) capsule 500 mg (500 mg Oral Given 02/11/21 0207)    ED Course  I have reviewed the triage vital signs and the nursing notes.  Pertinent labs & imaging results that were available during my care of the patient were reviewed by me and considered in my medical decision making (see chart for details).    MDM Rules/Calculators/A&P  [name] is a 65 y.o. male with a complex past medical history including CAD status post CABG, prior kidney stones, carotid disease, prior stroke, hepatitis, hypertension, hyperlipidemia, and neuroendocrine carcinoma with reported brain metastasis status post right upper lobe lung resection who presents with right flank pain.  Patient was seen by his oncologist yesterday as he has had some pain in his right flank for the last few days.  It is near his surgical scars from the lobectomy.  Patient reports that it is a constant aching and sharp pain.  He is unsure if feels like previous kidney stones.   He denies any urinary changes but he did start chemotherapy yesterday.  He describes the pain as moderate to severe at times and he does have some nausea.  Denies any vomiting now.  Denies any fevers, chills, chest pain, shortness of breath, palpitations.  He reports his breathing was actually doing better and he denies new coughing or the back pain worsening with coughing or breathing.  He denies any new constipation or diarrhea or any abdominal pains.  He had an x-ray done to look for kidney stones yesterday and did not show them it did show a small right pleural effusion.  On exam, lungs were clear and chest was nontender.  The patient has several scars in his right flank and back with his tenderness being just inferior to that.  No rash seen.  No purulence.  No erythema.  No crepitance.  Patient did have some tenderness in his right upper quadrant on my exam and normal bowel sounds were appreciated.  Intact exam otherwise.  Had a shared decision-making conversation with patient and I am concerned that his x-ray yesterday may not have fully excluded a kidney stone on the right side.  We also talked about postoperative pains  versus even a gallbladder etiology with the right upper quadrant tenderness.  We will give pain medicine, nausea medicine, and get repeat labs, urine, and a CT of the abdomen pelvis with contrast to look for pyelonephritis, postoperative complication or other abnormalities.  If work-up is reassuring and pain medicine is improving, anticipate he will be stable for discharge and follow-up tomorrow for his continued chemotherapy management by his oncology team.  Urinalysis does show leukocytes and bacteria which were not present yesterday.  No evidence of contamination.  This is suspicious for developing UTI and given his right flank discomfort, pyelonephritis.  His other labs are similar to prior and reassuring aside from the leukocytosis that is still present.  The CT scan did not  show any kidney stone, abscess, or focal abnormality in his right flank or back causing his symptoms but did show some subcutaneous gas in his right abdomen wall.  Patient reports that there were some subcutaneous gas troubles after his previous surgery so suspect that may be related.  He denies injection sites but there is no tenderness overlying this area, doubt gas from infection at this time.  His small pleural effusion is still present but given his lack of pleuritic pain, shortness of breath, or any tenderness over his lung area, low suspicion for that being the cause of his discomfort or infected fluid  Patient reports feeling better after medications.  Patient will be started on antibiotics for the early pyelonephritis especially given his new initiation of chemotherapy and will be given more of his pain medicine which she reports was helping at home when he was able to take it more frequently.  He denied any  other complaints and agrees with follow-up with his team tomorrow.  He no other questions or concerns and was discharged in good condition.    Final Clinical Impression(s) / ED Diagnoses Final diagnoses:  RUQ abdominal pain  Right flank pain  Pyelonephritis    Rx / DC Orders ED Discharge Orders          Ordered    cephALEXin (KEFLEX) 500 MG capsule  2 times daily        02/11/21 0158    oxyCODONE-acetaminophen (PERCOCET/ROXICET) 5-325 MG tablet  Every 4 hours PRN        02/11/21 0207           Clinical Impression: 1. Right flank pain   2. RUQ abdominal pain   3. Pyelonephritis     Disposition: Discharge  Condition: Good  I have discussed the results, Dx and Tx plan with the pt(& family if present). He/she/they expressed understanding and agree(s) with the plan. Discharge instructions discussed at great length. Strict return precautions discussed and pt &/or family have verbalized understanding of the instructions. No further questions at time of discharge.    New  Prescriptions   CEPHALEXIN (KEFLEX) 500 MG CAPSULE    Take 1 capsule (500 mg total) by mouth 2 (two) times daily for 14 days.   OXYCODONE-ACETAMINOPHEN (PERCOCET/ROXICET) 5-325 MG TABLET    Take 1 tablet by mouth every 4 (four) hours as needed for severe pain.    Follow Up: [name], MD [address] [place] [address] [phone]     your oncology team        Chasitee Zenker, Gwenyth Allegra, MD 02/11/21 [phone]

## 2021-02-11 ENCOUNTER — Inpatient Hospital Stay: Payer: Medicare Other

## 2021-02-11 ENCOUNTER — Emergency Department (HOSPITAL_BASED_OUTPATIENT_CLINIC_OR_DEPARTMENT_OTHER): Payer: Medicare Other

## 2021-02-11 VITALS — BP 118/74 | HR 89 | Temp 98.1°F | Resp 18

## 2021-02-11 DIAGNOSIS — Z5111 Encounter for antineoplastic chemotherapy: Secondary | ICD-10-CM | POA: Diagnosis not present

## 2021-02-11 DIAGNOSIS — N12 Tubulo-interstitial nephritis, not specified as acute or chronic: Secondary | ICD-10-CM | POA: Diagnosis not present

## 2021-02-11 DIAGNOSIS — C7A1 Malignant poorly differentiated neuroendocrine tumors: Secondary | ICD-10-CM

## 2021-02-11 LAB — URINALYSIS, ROUTINE W REFLEX MICROSCOPIC
Bilirubin Urine: NEGATIVE
Glucose, UA: NEGATIVE mg/dL
Hgb urine dipstick: NEGATIVE
Ketones, ur: NEGATIVE mg/dL
Nitrite: NEGATIVE
Protein, ur: NEGATIVE mg/dL
Specific Gravity, Urine: 1.01 (ref 1.005–1.030)
pH: 5.5 (ref 5.0–8.0)

## 2021-02-11 LAB — CBC WITH DIFFERENTIAL/PLATELET
Abs Immature Granulocytes: 0.08 10*3/uL — ABNORMAL HIGH (ref 0.00–0.07)
Basophils Absolute: 0 10*3/uL (ref 0.0–0.1)
Basophils Relative: 0 %
Eosinophils Absolute: 0 10*3/uL (ref 0.0–0.5)
Eosinophils Relative: 0 %
HCT: 37.8 % — ABNORMAL LOW (ref 39.0–52.0)
Hemoglobin: 12.3 g/dL — ABNORMAL LOW (ref 13.0–17.0)
Immature Granulocytes: 1 %
Lymphocytes Relative: 6 %
Lymphs Abs: 0.7 10*3/uL (ref 0.7–4.0)
MCH: 29.9 pg (ref 26.0–34.0)
MCHC: 32.5 g/dL (ref 30.0–36.0)
MCV: 92 fL (ref 80.0–100.0)
Monocytes Absolute: 0.3 10*3/uL (ref 0.1–1.0)
Monocytes Relative: 2 %
Neutro Abs: 11.3 10*3/uL — ABNORMAL HIGH (ref 1.7–7.7)
Neutrophils Relative %: 91 %
Platelets: 262 10*3/uL (ref 150–400)
RBC: 4.11 MIL/uL — ABNORMAL LOW (ref 4.22–5.81)
RDW: 12.7 % (ref 11.5–15.5)
WBC: 12.4 10*3/uL — ABNORMAL HIGH (ref 4.0–10.5)
nRBC: 0 % (ref 0.0–0.2)

## 2021-02-11 LAB — COMPREHENSIVE METABOLIC PANEL
ALT: 19 U/L (ref 0–44)
AST: 20 U/L (ref 15–41)
Albumin: 3.9 g/dL (ref 3.5–5.0)
Alkaline Phosphatase: 73 U/L (ref 38–126)
Anion gap: 9 (ref 5–15)
BUN: 21 mg/dL (ref 8–23)
CO2: 22 mmol/L (ref 22–32)
Calcium: 9 mg/dL (ref 8.9–10.3)
Chloride: 104 mmol/L (ref 98–111)
Creatinine, Ser: 0.9 mg/dL (ref 0.61–1.24)
GFR, Estimated: >60 mL/min (ref >60)
Glucose, Bld: 136 mg/dL — ABNORMAL HIGH (ref 70–99)
Potassium: 4.3 mmol/L (ref 3.5–5.1)
Sodium: 135 mmol/L (ref 135–145)
Total Bilirubin: 0.6 mg/dL (ref 0.3–1.2)
Total Protein: 7.3 g/dL (ref 6.5–8.1)

## 2021-02-11 LAB — URINALYSIS, MICROSCOPIC (REFLEX)

## 2021-02-11 LAB — LIPASE, BLOOD: Lipase: 20 U/L (ref 11–51)

## 2021-02-11 LAB — LACTIC ACID, PLASMA: Lactic Acid, Venous: 1.9 mmol/L (ref 0.5–1.9)

## 2021-02-11 MED ORDER — CEPHALEXIN 250 MG PO CAPS
500.0000 mg | ORAL_CAPSULE | Freq: Once | ORAL | Status: AC
  Administered 2021-02-11: 500 mg via ORAL
  Filled 2021-02-11: qty 2

## 2021-02-11 MED ORDER — SODIUM CHLORIDE 0.9% FLUSH
10.0000 mL | INTRAVENOUS | Status: DC | PRN
  Administered 2021-02-11: 10 mL
  Filled 2021-02-11: qty 10

## 2021-02-11 MED ORDER — SODIUM CHLORIDE 0.9 % IV SOLN
Freq: Once | INTRAVENOUS | Status: AC
  Filled 2021-02-11: qty 250

## 2021-02-11 MED ORDER — OXYCODONE-ACETAMINOPHEN 5-325 MG PO TABS: 1.0000 | ORAL_TABLET | ORAL | 0 refills | Status: DC | PRN

## 2021-02-11 MED ORDER — IOHEXOL 300 MG/ML  SOLN
100.0000 mL | Freq: Once | INTRAMUSCULAR | Status: AC | PRN
  Administered 2021-02-11: 100 mL via INTRAVENOUS

## 2021-02-11 MED ORDER — HEPARIN SOD (PORK) LOCK FLUSH 100 UNIT/ML IV SOLN
500.0000 [IU] | Freq: Once | INTRAVENOUS | Status: AC | PRN
  Administered 2021-02-11: 500 [IU]
  Filled 2021-02-11: qty 5

## 2021-02-11 MED ORDER — CEPHALEXIN 500 MG PO CAPS: 500.0000 mg | ORAL_CAPSULE | Freq: Two times a day (BID) | ORAL | 0 refills | Status: DC

## 2021-02-11 MED ORDER — SODIUM CHLORIDE 0.9 % IV SOLN
10.0000 mg | Freq: Once | INTRAVENOUS | Status: AC
  Administered 2021-02-11: 10 mg via INTRAVENOUS
  Filled 2021-02-11: qty 10

## 2021-02-11 MED ORDER — SODIUM CHLORIDE 0.9 % IV SOLN
120.0000 mg/m2 | Freq: Once | INTRAVENOUS | Status: AC
  Administered 2021-02-11: 250 mg via INTRAVENOUS
  Filled 2021-02-11: qty 12.5

## 2021-02-11 NOTE — Discharge Instructions (Addendum)
Your history, exam, work-up today are consistent with an early urine infection going to your kidney on the right side called pyelonephritis causing her discomfort.  The urine looks more infected today than it did yesterday and you still have an elevated white blood cell count.  As you are starting chemotherapy, you are more at risk for infection so we feel compelled to treat with antibiotics and have you continue antibiotics for the next 2 weeks.  Please continue using pain medicine and nausea medicine at home for your symptoms and try to maintain hydration.  The CT scan did not show evidence of kidney stones or other concerning finding but did still show the small pleural effusion as we discussed as well as the subcutaneous air bubble likely residual from your surgery or other injection.  Your exam was otherwise reassuring and your symptoms improved after medications.  We feel you are now safe to go home.  Please follow-up with your oncologist tomorrow and if any symptoms change or worsen acutely, please return to the nearest emergency department.

## 2021-02-11 NOTE — Patient Instructions (Signed)
[facility]  Discharge Instructions: Thank you for choosing [facility] to provide your oncology and hematology care.   If you have a lab appointment with the Cancer Center, please go directly to the Cancer Center and check in at the registration area.   Wear comfortable clothing and clothing appropriate for easy access to any Portacath or PICC line.   We strive to give you quality time with your provider. You may need to reschedule your appointment if you arrive late (15 or more minutes).  Arriving late affects you and other patients whose appointments are after yours.  Also, if you miss three or more appointments without notifying the office, you may be dismissed from the clinic at the provider's discretion.      For prescription refill requests, have your pharmacy contact our office and allow 72 hours for refills to be completed.    Today you received the following chemotherapy and/or immunotherapy agents: etoposide.     To help prevent nausea and vomiting after your treatment, we encourage you to take your nausea medication as directed.  BELOW ARE SYMPTOMS THAT SHOULD BE REPORTED IMMEDIATELY: . *FEVER GREATER THAN 100.4 F (38 C) OR HIGHER . *CHILLS OR SWEATING . *NAUSEA AND VOMITING THAT IS NOT CONTROLLED WITH YOUR NAUSEA MEDICATION . *UNUSUAL SHORTNESS OF BREATH . *UNUSUAL BRUISING OR BLEEDING . *URINARY PROBLEMS (pain or burning when urinating, or frequent urination) . *BOWEL PROBLEMS (unusual diarrhea, constipation, pain near the anus) . TENDERNESS IN MOUTH AND THROAT WITH OR WITHOUT PRESENCE OF ULCERS (sore throat, sores in mouth, or a toothache) . UNUSUAL RASH, SWELLING OR PAIN  . UNUSUAL VAGINAL DISCHARGE OR ITCHING   Items with * indicate a potential emergency and should be followed up as soon as possible or go to the Emergency Department if any problems should occur.  Please show the CHEMOTHERAPY ALERT CARD or IMMUNOTHERAPY ALERT  CARD at check-in to the Emergency Department and triage nurse.  Should you have questions after your visit or need to cancel or reschedule your appointment, please contact [facility]  Dept: [phone]  and follow the prompts.  Office hours are 8:00 a.m. to 4:30 p.m. Monday - Friday. Please note that voicemails left after 4:00 p.m. may not be returned until the following business day.  We are closed weekends and major holidays. You have access to a nurse at all times for urgent questions. Please call the main number to the clinic Dept: [phone] and follow the prompts.   For any non-urgent questions, you may also contact your provider using MyChart. We now offer e-Visits for anyone 18 and older to request care online for non-urgent symptoms. For details visit [email or web address].   Also download the MyChart app! Go to the app store, search "MyChart", open the app, select [facility], and log in with your MyChart username and password.  Due to Covid, a mask is required upon entering the hospital/clinic. If you do not have a mask, one will be given to you upon arrival. For doctor visits, patients may have 1 support person aged 18 or older with them. For treatment visits, patients cannot have anyone with them due to current Covid guidelines and our immunocompromised population.   

## 2021-02-11 NOTE — Progress Notes (Signed)
[facility] OFFICE PROGRESS NOTE  [name], MD [place] [address]  DIAGNOSIS: Stage IV (pT1c, N1, M1b) large cell neuroendocrine carcinoma.  He presented with a right upper lobe lung nodule and hilar lymphadenopathy.  He was diagnosed in April 2022. He also had a small metastatic brain lesion.    PDL1: 0%  PRIOR THERAPY:  1) Robotic assisted thoracic surgery with a right upper lobectomy which was performed on 12/24/2020.  2) SRS to the small metastatic brain lesion completed on 02/13/2021 under the care of Dr. Lisbeth Renshaw.  CURRENT THERAPY: Adjuvant chemotherapy with cisplatin 60 mg/m2 on day 1, etoposide 120 mg per metered squared on days 1, 2, and 3, IV every 3 weeks. First dose on 02/09/21. Status post 1 cycle.   INTERVAL HISTORY: [name] 65 y.o. male returns to the clinic today for a follow-up visit.  The patient was recently diagnosed with large cell neuroendocrine carcinoma.  He is status post robot-assisted thoracic surgery with a right upper lobectomy which was performed on 12/24/2020. He had some post surgical complications and is scheduled to see Dr. Kipp Brood on 02/27/21 to discuss removing the endobronchial valves. Regarding his systemic treatment, the plan is to proceed with adjuvant chemotherapy.  He had his first dose of treatment last week on 02/09/21.  On his first day of treatment, the patient had new onset right flank pain with associated nausea and some abdominal pain.  He had an abdominal x-ray performed which did not show any evidence of kidney stones.  The patient then presented to the emergency room on 02/10/2021 for the same concern.  He had a UA which showed a developing UTI and was treated with antibiotics for early pyelonephritis.  He also had an abdominal ultrasound as well as a CT of the abdomen and pelvis which did not show any other etiology for his pain.   He presented to the ER again on 02/14/21 for the same concern. The  urine culture showed no infection. A CT PE study was ordered. This was negative for PE. The CT returned showing a small right pleural effusion and patchy infiltrates of the right lung consistent with possible early multifocal pneumonia. The CT abdomen was without acute findings. He was given a prescription for doxycycline and he was already on keflex for possible pyelonephritis. It appears today one of the blood cultures obtained in the ER was reportedly positive for Staphylococcus species.   Overall, the pain is improved today compared to prior. He states he can feel the right flank pain somewhat. He takes percocet every 4-6 hours. He denies any triggers that exacerbate his pain such as movements, eating, etc. He characterizes his pain as achy/sharp. He denies fevers or night sweats. He reports feeling cold but attributes that to losing weight. He reports pretty significant fatigue over the weekend. He rested over the weekend. He had SRS to the brain lesion on 02/13/21. Starting Friday 02/13/21, the patient had nausea/vomiting. He has compazine and zofran. The compazine works better for his nausea. His last episode of emesis was this morning. Denies diarrhea or constipation. Reports his shortness of breath is better since his surgery. He continues to have a dry cough which is stable. He is using his incentive spirometer. He denies hemoptysis. He denies headaches or visual changes.   He also underwent EGD under the care of Dr. Benson Norway to evaluate the focal activity in the GE junction.  No concerning findings were found  on this exam for malignancy.    The patient is here today for evaluation and a 1 week follow-up visit to manage any adverse side effects of treatment.   MEDICAL HISTORY: Past Medical History:  Diagnosis Date   Anxiety    Arthritis    Basal cell carcinoma (BCC) of forehead    CAD (coronary artery disease)    a. 10/2015 ant STEMI >> LHC with 3 v CAD; oLAD tx with POBA >> emergent CABG. b.  Multiple evals since that time, early graft failure of SVG-RCA by cath 03/2016. c. 2/19 PCI/DES x1 to pRCA, normal EF.   Carotid artery disease ([place])    a. 40-59% BICA 02/2018.   Depression    Dyspnea    Ectopic atrial tachycardia (HCC)    Esophageal reflux    eosinophil esophagitis   Family history of adverse reaction to anesthesia    "sister has PONV" (06/21/2017)   Former tobacco use    Gout    Hepatitis C    "treated and cured" (06/21/2017)   High cholesterol    History of kidney stones    Hypertension    Ischemic cardiomyopathy    a. EF 25-30% at intraop TEE 4/17  //  b. Limited Echo 5/17 - EF 45-50%, mild ant HK. c. EF 55-65% by cath 09/2017.   Migraine    "3-4/yr" (06/21/2017)   Myocardial infarction ([place]) 10/2015   Palpitations    Sinus bradycardia    a. HR dropping into 40s in 02/2016 -> BB reduced.   Stroke [facility]) 10/2016   "small one; sometimes my memory/cognitive issues" (06/21/2017)   Symptomatic hypotension    a. 02/2016 ER visit -> meds reduced.   Syncope    Wears dentures    Wears glasses     ALLERGIES:  is allergic to prednisone, tetanus toxoids, wellbutrin [bupropion], indomethacin, other, scallops [shellfish allergy], and varenicline.  MEDICATIONS:  Current Outpatient Medications  Medication Sig Dispense Refill   albuterol (VENTOLIN HFA) 108 (90 Base) MCG/ACT inhaler Inhale 2 puffs into the lungs every 6 (six) hours as needed for wheezing or shortness of breath. 8 g 2   amiodarone (PACERONE) 200 MG tablet Take 1 tablet (200 mg total) by mouth daily. (Patient taking differently: Take 200 mg by mouth in the morning.) 30 tablet 1   amLODipine (NORVASC) 10 MG tablet TAKE 1 TABLET(10 MG) BY MOUTH DAILY (Patient taking differently: Take 10 mg by mouth in the morning.) 90 tablet 1   aspirin EC 81 MG tablet Take 81 mg by mouth in the morning. Swallow whole.     atorvastatin (LIPITOR) 80 MG tablet TAKE 1 TABLET(80 MG) BY MOUTH DAILY 90 tablet 2   cephALEXin (KEFLEX)  500 MG capsule Take 1 capsule (500 mg total) by mouth 2 (two) times daily for 14 days. 28 capsule 0   chlordiazePOXIDE (LIBRIUM) 10 MG capsule Take 10-20 mg by mouth 3 (three) times daily as needed for anxiety (sleep).     cloNIDine (CATAPRES) 0.1 MG tablet Take 0.1 mg by mouth at bedtime.     clopidogrel (PLAVIX) 75 MG tablet TAKE 1 TABLET BY MOUTH EVERY DAY (Patient taking differently: Take 75 mg by mouth in the morning.) 90 tablet 1   doxycycline (VIBRAMYCIN) 100 MG capsule Take 1 capsule (100 mg total) by mouth 2 (two) times daily for 10 days. 20 capsule 0   fluticasone (FLONASE) 50 MCG/ACT nasal spray Place 2 sprays into both nostrils daily. (Patient taking differently: Place 2 sprays  into both nostrils daily as needed for allergies.) 16 g 2   lidocaine-prilocaine (EMLA) cream Apply 1 application topically as needed. 30 g 2   metoprolol succinate (TOPROL-XL) 25 MG 24 hr tablet Take 1 tablet (25 mg total) by mouth daily. (Patient taking differently: Take 25 mg by mouth in the morning. Additional if needed for increase pulse rate) 90 tablet 3   Multiple Vitamin (MULTIVITAMIN WITH MINERALS) TABS tablet Take 1 tablet by mouth in the morning. Centrum     nitroGLYCERIN (NITROSTAT) 0.4 MG SL tablet PLACE 1 TABLET UNDER THE TONGUE EVERY 5 MINUTES AS NEEDED FOR CHEST PAIN. 3 DOSES MAX (Patient taking differently: Place 0.4 mg under the tongue every 5 (five) minutes x 3 doses as needed for chest pain. 3 DOSES MAX) 25 tablet 4   ondansetron (ZOFRAN) 8 MG tablet Take 1 tablet (8 mg total) by mouth every 8 (eight) hours as needed for nausea or vomiting. Starting 3 days after chemotherapy 30 tablet 2   prochlorperazine (COMPAZINE) 10 MG tablet Take 1 tablet (10 mg total) by mouth every 6 (six) hours as needed. (Patient taking differently: Take 10 mg by mouth every 6 (six) hours as needed for nausea.) 30 tablet 2   ranolazine (RANEXA) 1000 MG SR tablet Take 1 tablet (1,000 mg total) by mouth 2 (two) times daily.  180 tablet 0   Tiotropium Bromide-Olodaterol (STIOLTO RESPIMAT) 2.5-2.5 MCG/ACT AERS Inhale 2 puffs into the lungs daily. (Patient taking differently: Inhale 1 puff into the lungs in the morning and at bedtime.) 4 g 3   No current facility-administered medications for this visit.    SURGICAL HISTORY:  Past Surgical History:  Procedure Laterality Date   25 GAUGE PARS PLANA VITRECTOMY WITH 20 GAUGE MVR PORT  12/31/2020   Procedure: 25 GAUGE PARS PLANA VITRECTOMY WITH 20 GAUGE MVR PORT;  Surgeon: [name], MD;  [facility]: [facility];  Service: Thoracic;;   ANTERIOR CERVICAL DECOMP/DISCECTOMY FUSION N/A 10/17/2018   Procedure: Anterior Cervical Decompression Fusion - Cervical seven -Thoracic one;  Surgeon: Consuella Lose, MD;  Location: [facility];  Service: Neurosurgery;  Laterality: N/A;   BASAL CELL CARCINOMA EXCISION     "forehead   BIOPSY  07/20/2019   Procedure: BIOPSY;  Surgeon: Carol Ada, MD;  Location: [facility];  Service: Endoscopy;;   BRONCHIAL BIOPSY  12/24/2020   Procedure: BRONCHIAL BIOPSIES;  Surgeon: Garner Nash, DO;  Location: [facility];  Service: Pulmonary;;   BRONCHIAL BRUSHINGS  12/24/2020   Procedure: BRONCHIAL BRUSHINGS;  Surgeon: Garner Nash, DO;  Location: [place];  Service: Pulmonary;;   BRONCHIAL NEEDLE ASPIRATION BIOPSY  12/24/2020   Procedure: BRONCHIAL NEEDLE ASPIRATION BIOPSIES;  Surgeon: Garner Nash, DO;  Location: [place] [facility];  Service: Pulmonary;;   CARDIAC CATHETERIZATION N/A 11/28/2015   Procedure: Left Heart Cath and Coronary Angiography;  Surgeon: [name], MD;  Location: [place] [facility];  Service: Cardiovascular;  Laterality: N/A;   CARDIAC CATHETERIZATION N/A 11/28/2015   Procedure: Coronary Balloon Angioplasty;  Surgeon: [name], MD;  Location: [place];  Service: Cardiovascular;  Laterality: N/A;  ostial LAD   CARDIAC CATHETERIZATION N/A 11/28/2015   Procedure: Coronary/Graft Angiography;   Surgeon: [name], MD;  Location: [place] [facility];  Service: Cardiovascular;  Laterality: N/A;  coronaries only    CARDIAC CATHETERIZATION N/A 04/21/2016   Procedure: Left Heart Cath and Coronary Angiography;  Surgeon: Wellington Hampshire, MD;  Location: [place] [facility];  Service: Cardiovascular;  Laterality: N/A;   CARDIAC CATHETERIZATION N/A 06/14/2016   Procedure: Left Heart Cath and Cors/Grafts Angiography;  Surgeon: Lorretta Harp, MD;  Location: [place] [facility];  Service: Cardiovascular;  Laterality: N/A;   CARDIAC CATHETERIZATION N/A 09/08/2016   Procedure: Left Heart Cath and Cors/Grafts Angiography;  Surgeon: Wellington Hampshire, MD;  Location: [facility];  Service: Cardiovascular;  Laterality: N/A;   CARDIAC CATHETERIZATION     CORONARY ARTERY BYPASS GRAFT N/A 11/28/2015   Procedure: CORONARY ARTERY BYPASS GRAFTING (CABG) TIMES FIVE USING LEFT INTERNAL MAMMARY ARTERY AND RIGHT GREATER SAPHENOUS,VIEN HARVEATED BY ENDOVIEN, INTRAOPPRATIVE TEE;  Surgeon: Gaye Pollack, MD;  Location: [facility];  Service: Open Heart Surgery;  Laterality: N/A;   CORONARY STENT INTERVENTION N/A 10/05/2017   Procedure: CORONARY STENT INTERVENTION;  Surgeon: [name], MD;  Location: [place] [facility];  Service: Cardiovascular;  Laterality: N/A;   ESOPHAGOGASTRODUODENOSCOPY (EGD) WITH PROPOFOL N/A 07/20/2019   Procedure: ESOPHAGOGASTRODUODENOSCOPY (EGD) WITH PROPOFOL;  Surgeon: Carol Ada, MD;  [facility];  Service: Endoscopy;  Laterality: N/A;   ESOPHAGOGASTRODUODENOSCOPY (EGD) WITH PROPOFOL N/A 01/30/2021   Procedure: ESOPHAGOGASTRODUODENOSCOPY (EGD) WITH PROPOFOL;  Surgeon: Carol Ada, MD;  [facility];  Service: Endoscopy;  Laterality: N/A;   FIDUCIAL MARKER PLACEMENT  12/24/2020   Procedure: FIDUCIAL DYE MARKER PLACEMENT;  Surgeon: Garner Nash, DO;  Location: [facility];  Service: Pulmonary;;   HUMERUS SURGERY Right 1969   "tumor inside bone;  filled it w/bone chips"   INTERCOSTAL NERVE BLOCK Right 12/24/2020   Procedure: INTERCOSTAL NERVE BLOCK;  Surgeon: [name], MD;  Location: [facility];  Service: Thoracic;  Laterality: Right;   IR IMAGING GUIDED PORT INSERTION  02/06/2021   LEFT HEART CATH AND CORS/GRAFTS ANGIOGRAPHY N/A 03/11/2017   Procedure: Left Heart Cath and Cors/Grafts Angiography;  Surgeon: Leonie Man, MD;  Location: [place] [facility];  Service: Cardiovascular;  Laterality: N/A;   LEFT HEART CATH AND CORS/GRAFTS ANGIOGRAPHY N/A 10/05/2017   Procedure: LEFT HEART CATH AND CORS/GRAFTS ANGIOGRAPHY;  Surgeon: [name], MD;  Location: [place] [facility];  Service: Cardiovascular;  Laterality: N/A;   LEFT HEART CATH AND CORS/GRAFTS ANGIOGRAPHY N/A 04/11/2019   Procedure: LEFT HEART CATH AND CORS/GRAFTS ANGIOGRAPHY;  Surgeon: [name], MD;  Location: [place] [facility];  Service: Cardiovascular;  Laterality: N/A;   NODE DISSECTION Right 12/24/2020   Procedure: NODE DISSECTION;  Surgeon: [name], MD;  Location: [place];  Service: Thoracic;  Laterality: Right;   PERIPHERAL VASCULAR CATHETERIZATION N/A 06/14/2016   Procedure: Lower Extremity Angiography;  Surgeon: Lorretta Harp, MD;  Location: [place] [facility];  Service: Cardiovascular;  Laterality: N/A;   VIDEO BRONCHOSCOPY WITH ENDOBRONCHIAL NAVIGATION Right 12/24/2020   Procedure: VIDEO BRONCHOSCOPY WITH ENDOBRONCHIAL NAVIGATION;  Surgeon: Garner Nash, DO;  Location: [place];  Service: Pulmonary;  Laterality: Right;   VIDEO BRONCHOSCOPY WITH ENDOBRONCHIAL ULTRASOUND N/A 12/24/2020   Procedure: VIDEO BRONCHOSCOPY WITH ENDOBRONCHIAL ULTRASOUND;  Surgeon: Garner Nash, DO;  Location: [place];  Service: Pulmonary;  Laterality: N/A;   VIDEO BRONCHOSCOPY WITH INSERTION OF INTERBRONCHIAL VALVE (IBV) N/A 12/31/2020   Procedure: VIDEO BRONCHOSCOPY WITH INSERTION OF INTERBRONCHIAL VALVE (IBV).VALVE IN CARTRIDGE 37m,9mm. CHEST  TUBE PLACEMENT.;  Surgeon: LLajuana Matte MD;  [facility];  Service: Thoracic;  Laterality: N/A;    REVIEW OF SYSTEMS:   Review of Systems  Constitutional: Positive for fatigue, decreased appetite, and weight  loss. Positive for feeling cold more frequently. Negative for fever. HENT: Negative for mouth sores, nosebleeds, sore throat and trouble swallowing.   Eyes: Negative for eye problems and icterus.  Respiratory: Positive for stable dry cough.  Negative for hemoptysis, shortness of breath, and wheezing.    Cardiovascular: Negative for chest pain and leg swelling.  Gastrointestinal: Positive for nausea/vomiting. Negative for abdominal pain, constipation, or diarrhea. Genitourinary: Negative for bladder incontinence, difficulty urinating, dysuria, frequency and hematuria.   Musculoskeletal: Positive for right flank pain (intensity varies intermittently). Negative for gait problem, neck pain and neck stiffness.  Skin: Negative for itching and rash.  Neurological: Negative for dizziness, extremity weakness, gait problem, headaches, light-headedness and seizures.  Hematological: Negative for adenopathy. Does not bruise/bleed easily.  Psychiatric/Behavioral: Negative for confusion, depression and sleep disturbance. The patient is not nervous/anxious.     PHYSICAL EXAMINATION:  Blood pressure 121/80, pulse 91, temperature 98.6 F (37 C), temperature source Tympanic, resp. rate 19, height _0  (1.753 m), weight 189 lb 9.6 oz (86 kg), SpO2 100 %.  ECOG PERFORMANCE STATUS: 1  Physical Exam  Constitutional: Oriented to person, place, and time and well-developed, well-nourished, and in no distress.  HENT:  Head: Normocephalic and atraumatic.  Mouth/Throat: Oropharynx is clear and moist. No oropharyngeal exudate.  Eyes: Conjunctivae are normal. Right eye exhibits no discharge. Left eye exhibits no discharge. No scleral icterus.  Neck: Normal range of motion. Neck supple.   Cardiovascular: Normal rate, regular rhythm, normal heart sounds and intact distal pulses.   Pulmonary/Chest: Effort normal and breath sounds normal. No respiratory distress. No wheezes. No rales.  Abdominal: Soft. Bowel sounds are normal. Exhibits no distension and no mass. There is no tenderness.  Musculoskeletal: Positive for mild costovertebral tenderness (right). Normal range of motion. Exhibits no edema.  Lymphadenopathy:    No cervical adenopathy.  Neurological: Alert and oriented to person, place, and time. Exhibits normal muscle tone. Gait normal. Coordination normal.  Skin: Skin is warm and dry. No rash noted. Not diaphoretic. No erythema. No pallor.  Psychiatric: Mood, memory and judgment normal.  Vitals reviewed.  LABORATORY DATA: Lab Results  Component Value Date   WBC 5.5 02/16/2021   HGB 11.2 (L) 02/16/2021   HCT 33.8 (L) 02/16/2021   MCV 89.2 02/16/2021   PLT 221 02/16/2021      Chemistry      Component Value Date/Time   NA 135 02/16/2021 1425   K 4.0 02/16/2021 1425   CL 100 02/16/2021 1425   CO2 27 02/16/2021 1425   BUN 41 (H) 02/16/2021 1425   CREATININE 1.52 (H) 02/16/2021 1425   CREATININE 1.00 02/12/2016 1038      Component Value Date/Time   CALCIUM 9.3 02/16/2021 1425   ALKPHOS 62 02/16/2021 1425   AST 20 02/16/2021 1425   ALT 25 02/16/2021 1425   BILITOT 0.9 02/16/2021 1425       RADIOGRAPHIC STUDIES:  DG Abd 1 View  Result Date: 02/10/2021 CLINICAL DATA:  Right flank pain EXAM: ABDOMEN - 1 VIEW COMPARISON:  12/20/2018 FINDINGS: The bowel gas pattern is normal. No radio-opaque calculi or other significant radiographic abnormality are seen. Small right pleural effusion. Partially imaged port catheter at the cavoatrial junction. IMPRESSION: No urinary tract calculi identified. Electronically Signed   By: Macy Mis M.D.   On: 02/10/2021 14:02   CT Angio Chest PE W and/or Wo Contrast  Result Date: 02/14/2021 CLINICAL DATA:  Recent surgery.  Cancer. Right flank pain. Tachycardia. Pulmonary embolus  suspected with high probability. Recent diagnosis of UTI without improvement. History of lung cancer metastatic to brain. EXAM: CT ANGIOGRAPHY CHEST CT ABDOMEN AND PELVIS WITH CONTRAST TECHNIQUE: Multidetector CT imaging of the chest was performed using the standard protocol during bolus administration of intravenous contrast. Multiplanar CT image reconstructions and MIPs were obtained to evaluate the vascular anatomy. Multidetector CT imaging of the abdomen and pelvis was performed using the standard protocol during bolus administration of intravenous contrast. CONTRAST:  128m OMNIPAQUE IOHEXOL 350 MG/ML SOLN COMPARISON:  CT chest 12/12/2020.  CT abdomen and pelvis 02/11/2021 FINDINGS: CTA CHEST FINDINGS Cardiovascular: There is good opacification of the central and segmental pulmonary arteries. No focal filling defects. No evidence of significant pulmonary embolus. Ascending aortic aneurysm measuring 4 cm in diameter, unchanged. No aortic dissection. Great vessel origins are patent. Coronary artery and aortic calcification. Postoperative changes consistent with coronary bypass. Normal heart size. No pericardial effusions. Mediastinum/Nodes: Esophagus is decompressed. No significant lymphadenopathy in the chest. Scattered lymph nodes are not pathologically enlarged. Lungs/Pleura: Small right pleural effusion. Mild patchy areas of airspace disease in the right lung could represent early multifocal pneumonia. Left lung is clear. Previous right upper lung nodule has been resected in the interval. Musculoskeletal: Degenerative changes in the spine. Postoperative changes in the lower cervical spine. Sternotomy wires. Old rib fractures. Review of the MIP images confirms the above findings. CT ABDOMEN and PELVIS FINDINGS Hepatobiliary: Subcentimeter lesion in the dome of the liver likely representing a cavernous hemangioma. Gallbladder and bile ducts are  unremarkable. Pancreas: Unremarkable. No pancreatic ductal dilatation or surrounding inflammatory changes. Spleen: Normal in size without focal abnormality. Adrenals/Urinary Tract: No adrenal gland nodules. Cyst in the upper pole left kidney. Nephrograms are symmetrical and otherwise homogeneous. No hydronephrosis or hydroureter. Bladder is unremarkable. Stomach/Bowel: Stomach, small bowel, and colon are not abnormally distended. No wall thickening or inflammatory changes are apparent. Scattered diverticula in the sigmoid colon without evidence of diverticulitis. Appendix is normal. Vascular/Lymphatic: Aortic atherosclerosis. No enlarged abdominal or pelvic lymph nodes. Reproductive: Prostate is unremarkable. Other: No free air or free fluid in the abdomen. Abdominal wall musculature appears intact. Musculoskeletal: Degenerative changes in the spine. No destructive bone lesions. Review of the MIP images confirms the above findings. IMPRESSION: 1. No evidence of significant pulmonary embolus. 2. Small right pleural effusion. Patchy infiltrates in the right lung are nonspecific but could indicate early multifocal pneumonia. 3. No acute process demonstrated in the abdomen or pelvis. 4. Aortic atherosclerosis. Electronically Signed   By: WLucienne CapersM.D.   On: 02/14/2021 22:09   MR Brain W Wo Contrast  Result Date: 02/05/2021 CLINICAL DATA:  Follow-up CNS neoplasm EXAM: MRI HEAD WITHOUT AND WITH CONTRAST TECHNIQUE: Multiplanar, multiecho pulse sequences of the brain and surrounding structures were obtained without and with intravenous contrast. CONTRAST:  135mMULTIHANCE GADOBENATE DIMEGLUMINE 529 MG/ML IV SOLN COMPARISON:  01/27/2021 FINDINGS: BRAIN New Lesions: 3 mm nodule at the high left and parasagittal frontal lobe seen on 12:147 Larger lesions: None. Stable or Smaller lesions: 6 mm mm enhancing lesion located in the lateral left cerebellum and seen on 11:40. Other Brain findings: Mild vasogenic edema  around the left cerebellar metastasis. No acute infarct, acute hemorrhage, hydrocephalus, or collection. Vascular: Normal flow voids and vascular enhancements Skull and upper cervical spine: Normal marrow signal Sinuses/Orbits: Negative IMPRESSION: 1. 3 mm newly seen metastasis in the high left frontal lobe. 2. 6 mm unchanged left cerebellar metastasis. Electronically Signed   By: JoNeva Seat.  On: 02/05/2021 06:09   MR Brain W Wo Contrast  Result Date: 01/27/2021 CLINICAL DATA:  High-grade neuroendocrine carcinoma. Assess for metastatic disease. EXAM: MRI HEAD WITHOUT AND WITH CONTRAST TECHNIQUE: Multiplanar, multiecho pulse sequences of the brain and surrounding structures were obtained without and with intravenous contrast. CONTRAST:  75m GADAVIST GADOBUTROL 1 MMOL/ML IV SOLN COMPARISON:  02/23/2020 FINDINGS: Brain: Diffusion imaging does not show any acute or subacute infarction. Newly seen 6.5 mm ring-enhancing lesion in the left lateral cerebellum consistent with a small metastasis. Minimal adjacent edema. There is some susceptibility artifact associated with this metastasis likely indicating microhemorrhage. No second intracranial metastatic lesion. Minimal small vessel change affects the hemispheric white matter. No large vessel territory infarction. No hydrocephalus or extra-axial collection. Vascular: Major vessels at the base of the brain show flow. Skull and upper cervical spine: Negative Sinuses/Orbits: Clear/normal Other: None IMPRESSION: 6.5 mm ring-enhancing lesion of the left lateral cerebellum with some hemosiderin/blood products, consistent with a solitary metastasis. Minimal adjacent edema. No second lesion identified. Electronically Signed   By: MNelson ChimesM.D.   On: 01/27/2021 14:01   CT ABDOMEN PELVIS W CONTRAST  Result Date: 02/14/2021 CLINICAL DATA:  Recent surgery. Cancer. Right flank pain. Tachycardia. Pulmonary embolus suspected with high probability. Recent diagnosis  of UTI without improvement. History of lung cancer metastatic to brain. EXAM: CT ANGIOGRAPHY CHEST CT ABDOMEN AND PELVIS WITH CONTRAST TECHNIQUE: Multidetector CT imaging of the chest was performed using the standard protocol during bolus administration of intravenous contrast. Multiplanar CT image reconstructions and MIPs were obtained to evaluate the vascular anatomy. Multidetector CT imaging of the abdomen and pelvis was performed using the standard protocol during bolus administration of intravenous contrast. CONTRAST:  1066mOMNIPAQUE IOHEXOL 350 MG/ML SOLN COMPARISON:  CT chest 12/12/2020.  CT abdomen and pelvis 02/11/2021 FINDINGS: CTA CHEST FINDINGS Cardiovascular: There is good opacification of the central and segmental pulmonary arteries. No focal filling defects. No evidence of significant pulmonary embolus. Ascending aortic aneurysm measuring 4 cm in diameter, unchanged. No aortic dissection. Great vessel origins are patent. Coronary artery and aortic calcification. Postoperative changes consistent with coronary bypass. Normal heart size. No pericardial effusions. Mediastinum/Nodes: Esophagus is decompressed. No significant lymphadenopathy in the chest. Scattered lymph nodes are not pathologically enlarged. Lungs/Pleura: Small right pleural effusion. Mild patchy areas of airspace disease in the right lung could represent early multifocal pneumonia. Left lung is clear. Previous right upper lung nodule has been resected in the interval. Musculoskeletal: Degenerative changes in the spine. Postoperative changes in the lower cervical spine. Sternotomy wires. Old rib fractures. Review of the MIP images confirms the above findings. CT ABDOMEN and PELVIS FINDINGS Hepatobiliary: Subcentimeter lesion in the dome of the liver likely representing a cavernous hemangioma. Gallbladder and bile ducts are unremarkable. Pancreas: Unremarkable. No pancreatic ductal dilatation or surrounding inflammatory changes. Spleen:  Normal in size without focal abnormality. Adrenals/Urinary Tract: No adrenal gland nodules. Cyst in the upper pole left kidney. Nephrograms are symmetrical and otherwise homogeneous. No hydronephrosis or hydroureter. Bladder is unremarkable. Stomach/Bowel: Stomach, small bowel, and colon are not abnormally distended. No wall thickening or inflammatory changes are apparent. Scattered diverticula in the sigmoid colon without evidence of diverticulitis. Appendix is normal. Vascular/Lymphatic: Aortic atherosclerosis. No enlarged abdominal or pelvic lymph nodes. Reproductive: Prostate is unremarkable. Other: No free air or free fluid in the abdomen. Abdominal wall musculature appears intact. Musculoskeletal: Degenerative changes in the spine. No destructive bone lesions. Review of the MIP images confirms the above findings. IMPRESSION: 1.  No evidence of significant pulmonary embolus. 2. Small right pleural effusion. Patchy infiltrates in the right lung are nonspecific but could indicate early multifocal pneumonia. 3. No acute process demonstrated in the abdomen or pelvis. 4. Aortic atherosclerosis. Electronically Signed   By: Lucienne Capers M.D.   On: 02/14/2021 22:09   CT ABDOMEN PELVIS W CONTRAST  Result Date: 02/11/2021 CLINICAL DATA:  Right flank pain and right upper quadrant tenderness. Kidney stones suspected. Recent surgery for cancer removal. First chemotherapy yesterday. EXAM: CT ABDOMEN AND PELVIS WITH CONTRAST TECHNIQUE: Multidetector CT imaging of the abdomen and pelvis was performed using the standard protocol following bolus administration of intravenous contrast. CONTRAST:  162m OMNIPAQUE IOHEXOL 300 MG/ML  SOLN COMPARISON:  11/18/2020 FINDINGS: Lower chest: Small right pleural effusion. Hepatobiliary: No focal liver abnormality is seen. No gallstones, gallbladder wall thickening, or biliary dilatation. Pancreas: Unremarkable. No pancreatic ductal dilatation or surrounding inflammatory changes.  Spleen: Normal in size without focal abnormality. Adrenals/Urinary Tract: No adrenal gland nodules. Small cyst on the left kidney. Nephrograms are symmetrical. No hydronephrosis or hydroureter. No renal or ureteral stones identified. Bladder is unremarkable. Stomach/Bowel: Stomach, small bowel, and colon are not abnormally distended. No wall thickening or inflammatory changes are appreciated. Appendix is normal. Vascular/Lymphatic: Aortic atherosclerosis. No enlarged abdominal or pelvic lymph nodes. Reproductive: Prostate is unremarkable. Other: No free air or free fluid in the abdomen. Small focal subcutaneous emphysema in the anterior abdominal wall, likely injection site. No infiltration or collection in the subcutaneous fat to suggest postoperative complication. Musculoskeletal: No acute or significant osseous findings. IMPRESSION: 1. No renal or ureteral stone or obstruction. 2. No acute process demonstrated in the abdomen or pelvis. 3. Aortic atherosclerosis. 4. Small right pleural effusion. Electronically Signed   By: WLucienne CapersM.D.   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 He presents for port catheter placement to establish durable venous access. EXAM: IMPLANTED PORT A CATH PLACEMENT WITH ULTRASOUND AND FLUOROSCOPIC GUIDANCE MEDICATIONS: None. ANESTHESIA/SEDATION: Versed 2 mg IV; Fentanyl 100 mcg IV; Moderate Sedation Time:  23 minutes The patient was continuously monitored during the procedure by the interventional radiology nurse under my direct supervision. FLUOROSCOPY TIME:  0 minutes, 18 seconds (6 mGy) COMPLICATIONS: None immediate. PROCEDURE: The right neck and chest was prepped with chlorhexidine, and draped in the usual sterile fashion using maximum barrier technique (cap and mask, sterile gown, sterile gloves, large sterile sheet, hand hygiene and cutaneous  antiseptic). Local anesthesia was attained by infiltration with 1% lidocaine with epinephrine. Ultrasound demonstrated patency of the right internal jugular vein, and this was documented with an image. Under real-time ultrasound guidance, this vein was accessed with a 21 gauge micropuncture needle and image documentation was performed. A small dermatotomy was made at the access site with an 11 scalpel. A 0.018" wire was advanced into the SVC and the access needle exchanged for a 30F micropuncture vascular sheath. The 0.018" wire was then removed and a 0.035" wire advanced into the IVC. An appropriate location for the subcutaneous reservoir was selected below the clavicle and an incision was made through the skin and underlying soft tissues. The subcutaneous tissues were then dissected using a combination of blunt and sharp surgical technique and a pocket was formed. A single lumen power injectable portacatheter was then tunneled through the subcutaneous tissues from the pocket to the dermatotomy and the port reservoir placed within the  subcutaneous pocket. The venous access site was then serially dilated and a peel away vascular sheath placed over the wire. The wire was removed and the port catheter advanced into position under fluoroscopic guidance. The catheter tip is positioned in the superior cavoatrial junction. This was documented with a spot image. The portacatheter was then tested and found to flush and aspirate well. The port was flushed with saline followed by 100 units/mL heparinized saline. The pocket was then closed in two layers using first subdermal inverted interrupted absorbable sutures followed by a running subcuticular suture. The epidermis was then sealed with Dermabond. The dermatotomy at the venous access site was also closed with Dermabond. IMPRESSION: Successful placement of a right IJ approach Power Port with ultrasound and fluoroscopic guidance. The catheter is ready for use. Electronically  Signed   By: Jacqulynn Cadet M.D.   On: 02/06/2021 14:33   LONG TERM MONITOR-LIVE TELEMETRY (3-14 DAYS)  Result Date: 02/09/2021  Normal sinus rhythm with rare PACs and PVCs.  Brief, string of PACs not associated with symptoms.  No sustained pathologic arrhythmias.  Patch Wear Time:  14 days and 0 hours (2022-05-14T12:06:36-0400 to 2022-05-28T12:06:36-0400) Patient had a min HR of 45 bpm, max HR of 203 bpm, and avg HR of 82 bpm. Predominant underlying rhythm was Sinus Rhythm. 1 run of Supraventricular Tachycardia occurred lasting 6 beats with a max rate of 203 bpm (avg 199 bpm). Isolated SVEs were rare (<1.0%), SVE Couplets were rare (<1.0%), and SVE Triplets were rare (<1.0%). Isolated VEs were rare (<1.0%), and no VE Couplets or VE Triplets were present.   US Abdomen Limited RUQ (LIVER/GB)  Result Date: 02/11/2021 CLINICAL DATA:  Right upper quadrant pain EXAM: ULTRASOUND ABDOMEN LIMITED RIGHT UPPER QUADRANT COMPARISON:  10/24/2020 FINDINGS: Gallbladder: No gallstones or wall thickening visualized. No sonographic Murphy sign noted by sonographer. Common bile duct: Diameter: Normal caliber, 4 mm Liver: No focal lesion identified. Within normal limits in parenchymal echogenicity. Portal vein is patent on color Doppler imaging with normal direction of blood flow towards the liver. Other: None. IMPRESSION: No acute findings. Electronically Signed   By: Rolm Baptise M.D.   On: 02/11/2021 00:37     ASSESSMENT/PLAN:  This is a very pleasant 65 year old Caucasian male diagnosed with stage IV (pT1c, N1, M1c) large cell neuroendocrine carcinoma.  He presented with a right upper lobe lung nodule and hilar lymphadenopathy.   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 His PD-L1 expression is 0%.  He was diagnosed in April 2022. His foundation one testing is negative for any actionable mutations.    The patient completed SRS to the small metastatic brain lesion under the care of Dr.  Lisbeth Renshaw on 02/13/2021.  The patient is currently undergoing adjuvant chemotherapy with cisplatin 60 mg per metered squared, on day 1, etoposide 120 mg per metered squared on days 1, 2, and 3 IV every 3 weeks. He received his first dose on 02/09/21. He tolerated it fairly well but is reporting fatigue and some nausea/vomiting.   The patient was seen with Dr. [name] today. His CBC is acceptable. From reviewing his lab work from the [facility] on 01/14/21. It appears he had two blood cultures performed. One of the blood cultures returned positive for Staphylococcus species. I discussed with Dr. [name], given the positive results and endobronchial valves from his cardiothoracic surgery and recent port-a-cath placement, Dr. [name] recommends that he be evaluated in the ER due to the positive blood culture and management  of this finding.   If repeat blood cultures are to be obtained in the ER, we would recommend one of the cultures being from his port-a-cath.   We will see him back for a follow up visit in 2 weeks for evaluation before starting cycle #2.   He has percocet for pain if needed.   He has compazine and zofran if needed for nausea. Reviewed with the patient how to take his anti-emetics.   The patient was advised to call immediately if he has any concerning symptoms in the interval. The patient voices understanding of current disease status and treatment options and is in agreement with the current care plan. All questions were answered. The patient knows to call the clinic with any problems, questions or concerns. We can certainly see the patient much sooner if necessary       No orders of the defined types were placed in this encounter.     Kaseem Vastine L Margia Wiesen, PA-C 02/16/21  ADDENDUM: Hematology/Oncology Attending: I had a face-to-face encounter with the patient today.  I reviewed his records and recommended his care plan.  This is a very pleasant 65 years old white male recently  diagnosed with a stage IV (T1c, N1, M1 C) non-small cell lung cancer,, large cell neuroendocrine carcinoma resented with right upper lobe lung nodule in addition to hilar lymphadenopathy and recently was found to have small solitary metastatic brain lesion in the left cerebellum.  The patient has no actionable mutation and PD-L1 expression was 0. He was diagnosed in April 2022 status post right upper lobectomy followed by [facility] to the solitary brain metastasis. The patient is started adjuvant systemic chemotherapy with cisplatin and etoposide last week.  He tolerated the first week of his treatment well but has been complaining of persistent right lower rib cage as well as right flank pain for several weeks.  The patient was evaluated in the clinic as well as the emergency department with no clear etiology.  During her last evaluation in the emergency department he had blood culture and one of the culture was reported earlier today positive for gram-positive cocci in chains consistent with the Staphylococcus species.  He was treated recently with doxycycline and Keflex The patient is feeling better today but with the new [place] blood culture, I will strongly recommend for the patient to go to the emergency department for evaluation and possibly admission for treatment of his sepsis. Will continue to follow his blood work closely. The patient is expected to start cycle #2 of his chemotherapy in 2 weeks if there is no concerning findings at that time. He was advised to call immediately if he has any other concerning symptoms in the interval.  Disclaimer: This note was dictated with voice recognition software. Similar sounding words can inadvertently be transcribed and may be missed upon review. Eilleen Kempf, MD 02/16/21

## 2021-02-11 NOTE — ED Notes (Signed)
[name] at bedside for [name] of ABD RUQ

## 2021-02-11 NOTE — ED Notes (Signed)
Patient transported to CT 

## 2021-02-12 ENCOUNTER — Telehealth: Payer: Self-pay | Admitting: *Deleted

## 2021-02-12 ENCOUNTER — Ambulatory Visit: Payer: Medicare Other | Admitting: Radiation Oncology

## 2021-02-12 DIAGNOSIS — Z510 Encounter for antineoplastic radiation therapy: Secondary | ICD-10-CM | POA: Diagnosis not present

## 2021-02-12 LAB — URINE CULTURE: Culture: NO GROWTH

## 2021-02-13 ENCOUNTER — Other Ambulatory Visit: Payer: Self-pay | Admitting: Physician Assistant

## 2021-02-13 ENCOUNTER — Ambulatory Visit
Admission: RE | Admit: 2021-02-13 | Discharge: 2021-02-13 | Disposition: A | Discharge status: Home / Self Care | Payer: Medicare Other | Source: Ambulatory Visit | Attending: Radiation Oncology | Admitting: Radiation Oncology

## 2021-02-13 ENCOUNTER — Telehealth: Payer: Self-pay

## 2021-02-13 ENCOUNTER — Other Ambulatory Visit: Payer: Self-pay

## 2021-02-13 ENCOUNTER — Encounter: Payer: Self-pay | Admitting: Radiation Oncology

## 2021-02-13 DIAGNOSIS — R110 Nausea: Secondary | ICD-10-CM

## 2021-02-13 DIAGNOSIS — Z510 Encounter for antineoplastic radiation therapy: Secondary | ICD-10-CM | POA: Diagnosis not present

## 2021-02-13 MED ORDER — ONDANSETRON HCL 8 MG PO TABS: 8.0000 mg | ORAL_TABLET | Freq: Three times a day (TID) | ORAL | 2 refills | Status: DC | PRN

## 2021-02-13 NOTE — Op Note (Signed)
  Name: SZYMON FOILES  MRN: [id number]  Date: 02/13/2021   DOB: 1956-05-25  Stereotactic Radiosurgery Operative Note  PRE-OPERATIVE DIAGNOSIS:  Multiple Brain Metastases - large cell neuroendocrine CA  POST-OPERATIVE DIAGNOSIS:  Same  PROCEDURE:  Stereotactic Radiosurgery  SURGEON:  Jairo Ben, MD  RADIATION ONCOLOGIST: Dr. Kyung Rudd, MD  NARRATIVE: The patient underwent a radiation treatment planning session in the radiation oncology simulation suite under the care of the radiation oncology physician and physicist.  I participated closely in the radiation treatment planning afterwards. The patient underwent planning CT which was fused to 3T high resolution MRI with 1 mm axial slices.  These images were fused on the planning system.  We contoured the gross target volumes and subsequently expanded this to yield the Planning Target Volume. I actively participated in the planning process.  I helped to define and review the target contours and also the contours of the optic pathway, eyes, brainstem and selected nearby organs at risk.  All the dose constraints for critical structures were reviewed and compared to AAPM Task Group 101.  The prescription dose conformity was reviewed.  I approved the plan electronically.    Accordingly, [name] was brought to the TrueBeam stereotactic radiation treatment linac and placed in the custom immobilization mask.  The patient was aligned according to the IR fiducial markers with BrainLab Exactrac, then orthogonal x-rays were used in ExacTrac with the 6DOF robotic table and the shifts were made to align the patient  [name] received stereotactic radiosurgery uneventfully.    Lesions treated:  2  Left cerebellar - 20Gy Paramedian Left frontal - 20Gy  Complex lesions treated:  0 (>3.5 cm, <78mm of optic path, or within the brainstem)   The detailed description of the procedure is recorded in the radiation oncology procedure note.  I  was present for the duration of the procedure.  DISPOSITION:  Following delivery, the patient was transported to nursing in stable condition and monitored for possible acute effects to be discharged to home in stable condition with follow-up in one month.  Jairo Ben, MD 02/13/2021 4:32 PM

## 2021-02-13 NOTE — Progress Notes (Signed)
Mr. Botting rested with [name] for 30 minutes following his SRS treatment.  Patient denies headache, dizziness, nausea, diplopia or ringing in the ears. Denies fatigue. Patient without complaints. Understands to avoid strenuous activity for the next 24 hours and call [phone] with needs.   BP 111/76 (BP Location: Right Arm, Patient Position: Sitting, Cuff Size: Normal)   Pulse 97   Temp 97.8 F (36.6 C)   Resp 20   SpO2 100%    Berton Butrick M. Leonie Green, BSN

## 2021-02-13 NOTE — Telephone Encounter (Signed)
Pt called stating he's been feeling pretty good since his tx but this morning at about 3am he woke up extremely nauseous with vomiting. Pt wants to know if it's normal to feel this way 2-4 days after tx.   Discussed with Cassandra PA-C and pt was advised delayed nausea is something that can happen and to be sure to stay hydrated and take his nausea medication. In addition to Compazine, Cassandra PA-C has sent Zofran and pt advised. Pt expressed understanding of this information.

## 2021-02-14 ENCOUNTER — Other Ambulatory Visit: Payer: Self-pay

## 2021-02-14 ENCOUNTER — Emergency Department (HOSPITAL_BASED_OUTPATIENT_CLINIC_OR_DEPARTMENT_OTHER)
Admission: EM | Admit: 2021-02-14 | Discharge: 2021-02-15 | Disposition: A | Discharge status: Home / Self Care | Payer: Medicare Other | Source: Home / Self Care | Attending: Emergency Medicine | Admitting: Emergency Medicine

## 2021-02-14 ENCOUNTER — Encounter (HOSPITAL_BASED_OUTPATIENT_CLINIC_OR_DEPARTMENT_OTHER): Payer: Self-pay | Admitting: Emergency Medicine

## 2021-02-14 ENCOUNTER — Emergency Department (HOSPITAL_BASED_OUTPATIENT_CLINIC_OR_DEPARTMENT_OTHER): Payer: Medicare Other

## 2021-02-14 DIAGNOSIS — Z20822 Contact with and (suspected) exposure to covid-19: Secondary | ICD-10-CM | POA: Insufficient documentation

## 2021-02-14 DIAGNOSIS — J90 Pleural effusion, not elsewhere classified: Secondary | ICD-10-CM

## 2021-02-14 DIAGNOSIS — Z87891 Personal history of nicotine dependence: Secondary | ICD-10-CM | POA: Insufficient documentation

## 2021-02-14 DIAGNOSIS — I10 Essential (primary) hypertension: Secondary | ICD-10-CM | POA: Insufficient documentation

## 2021-02-14 DIAGNOSIS — R109 Unspecified abdominal pain: Secondary | ICD-10-CM

## 2021-02-14 DIAGNOSIS — Z7902 Long term (current) use of antithrombotics/antiplatelets: Secondary | ICD-10-CM | POA: Insufficient documentation

## 2021-02-14 DIAGNOSIS — J189 Pneumonia, unspecified organism: Secondary | ICD-10-CM

## 2021-02-14 DIAGNOSIS — R1011 Right upper quadrant pain: Secondary | ICD-10-CM | POA: Insufficient documentation

## 2021-02-14 DIAGNOSIS — Z951 Presence of aortocoronary bypass graft: Secondary | ICD-10-CM | POA: Insufficient documentation

## 2021-02-14 DIAGNOSIS — Z85828 Personal history of other malignant neoplasm of skin: Secondary | ICD-10-CM | POA: Insufficient documentation

## 2021-02-14 DIAGNOSIS — Z79899 Other long term (current) drug therapy: Secondary | ICD-10-CM | POA: Insufficient documentation

## 2021-02-14 DIAGNOSIS — Z7982 Long term (current) use of aspirin: Secondary | ICD-10-CM | POA: Insufficient documentation

## 2021-02-14 DIAGNOSIS — M545 Low back pain, unspecified: Secondary | ICD-10-CM | POA: Diagnosis not present

## 2021-02-14 DIAGNOSIS — N179 Acute kidney failure, unspecified: Secondary | ICD-10-CM | POA: Diagnosis not present

## 2021-02-14 DIAGNOSIS — Z85118 Personal history of other malignant neoplasm of bronchus and lung: Secondary | ICD-10-CM | POA: Insufficient documentation

## 2021-02-14 DIAGNOSIS — R000 Tachycardia, unspecified: Secondary | ICD-10-CM | POA: Insufficient documentation

## 2021-02-14 DIAGNOSIS — I25110 Atherosclerotic heart disease of native coronary artery with unstable angina pectoris: Secondary | ICD-10-CM | POA: Insufficient documentation

## 2021-02-14 LAB — HEPATIC FUNCTION PANEL
ALT: 24 U/L (ref 0–44)
AST: 20 U/L (ref 15–41)
Albumin: 3.9 g/dL (ref 3.5–5.0)
Alkaline Phosphatase: 64 U/L (ref 38–126)
Bilirubin, Direct: 0.2 mg/dL (ref 0.0–0.2)
Indirect Bilirubin: 0.7 mg/dL (ref 0.3–0.9)
Total Bilirubin: 0.9 mg/dL (ref 0.3–1.2)
Total Protein: 6.9 g/dL (ref 6.5–8.1)

## 2021-02-14 LAB — LIPASE, BLOOD: Lipase: 21 U/L (ref 11–51)

## 2021-02-14 LAB — CBC
HCT: 40.9 % (ref 39.0–52.0)
Hemoglobin: 13.7 g/dL (ref 13.0–17.0)
MCH: 30 pg (ref 26.0–34.0)
MCHC: 33.5 g/dL (ref 30.0–36.0)
MCV: 89.5 fL (ref 80.0–100.0)
Platelets: 283 10*3/uL (ref 150–400)
RBC: 4.57 MIL/uL (ref 4.22–5.81)
RDW: 12.1 % (ref 11.5–15.5)
WBC: 10.1 10*3/uL (ref 4.0–10.5)
nRBC: 0 % (ref 0.0–0.2)

## 2021-02-14 LAB — BASIC METABOLIC PANEL
Anion gap: 9 (ref 5–15)
BUN: 27 mg/dL — ABNORMAL HIGH (ref 8–23)
CO2: 27 mmol/L (ref 22–32)
Calcium: 9.4 mg/dL (ref 8.9–10.3)
Chloride: 99 mmol/L (ref 98–111)
Creatinine, Ser: 1.09 mg/dL (ref 0.61–1.24)
GFR, Estimated: >60 mL/min (ref >60)
Glucose, Bld: 110 mg/dL — ABNORMAL HIGH (ref 70–99)
Potassium: 3.5 mmol/L (ref 3.5–5.1)
Sodium: 135 mmol/L (ref 135–145)

## 2021-02-14 LAB — URINALYSIS, ROUTINE W REFLEX MICROSCOPIC
Bilirubin Urine: NEGATIVE
Glucose, UA: 100 mg/dL — AB
Hgb urine dipstick: NEGATIVE
Ketones, ur: NEGATIVE mg/dL
Leukocytes,Ua: NEGATIVE
Nitrite: NEGATIVE
Protein, ur: 100 mg/dL — AB
Specific Gravity, Urine: 1.015 (ref 1.005–1.030)
pH: 7 (ref 5.0–8.0)

## 2021-02-14 LAB — URINALYSIS, MICROSCOPIC (REFLEX)

## 2021-02-14 LAB — RESP PANEL BY RT-PCR (FLU A&B, COVID) ARPGX2
Influenza A by PCR: NEGATIVE
Influenza B by PCR: NEGATIVE
SARS Coronavirus 2 by RT PCR: NEGATIVE

## 2021-02-14 LAB — LACTIC ACID, PLASMA
Lactic Acid, Venous: 1.5 mmol/L (ref 0.5–1.9)
Lactic Acid, Venous: 1.1 mmol/L (ref 0.5–1.9)

## 2021-02-14 MED ORDER — DOXYCYCLINE HYCLATE 100 MG PO CAPS: 100.0000 mg | ORAL_CAPSULE | Freq: Two times a day (BID) | ORAL | 0 refills | Status: DC

## 2021-02-14 MED ORDER — ONDANSETRON HCL 4 MG/2ML IJ SOLN
4.0000 mg | Freq: Once | INTRAMUSCULAR | Status: AC
  Administered 2021-02-14: 4 mg via INTRAVENOUS
  Filled 2021-02-14: qty 2

## 2021-02-14 MED ORDER — OXYCODONE HCL 5 MG PO TABS
5.0000 mg | ORAL_TABLET | Freq: Once | ORAL | Status: AC
  Administered 2021-02-14: 5 mg via ORAL
  Filled 2021-02-14: qty 1

## 2021-02-14 MED ORDER — IOHEXOL 350 MG/ML SOLN
100.0000 mL | Freq: Once | INTRAVENOUS | Status: AC | PRN
  Administered 2021-02-14: 100 mL via INTRAVENOUS

## 2021-02-14 MED ORDER — SODIUM CHLORIDE 0.9 % IV BOLUS
1000.0000 mL | Freq: Once | INTRAVENOUS | Status: AC
  Administered 2021-02-14: 1000 mL via INTRAVENOUS

## 2021-02-14 MED ORDER — MORPHINE SULFATE (PF) 4 MG/ML IV SOLN
4.0000 mg | Freq: Once | INTRAVENOUS | Status: AC
  Administered 2021-02-14: 4 mg via INTRAVENOUS
  Filled 2021-02-14: qty 1

## 2021-02-14 NOTE — ED Triage Notes (Signed)
Seen on 14th and diagnosed with UTI.  Reports right flank pain has not gotten better.  Oncologist told him to come back.  Denies any fever.  Does endorse chills and nausea.  Currently going through chemo and radiation.

## 2021-02-14 NOTE — ED Provider Notes (Signed)
[facility] Provider Note   CSN: [id number] Arrival date & time: 02/14/21  1947     History Chief Complaint  Patient presents with   Urinary Tract Infection    [name] is a 65 y.o. male.  HPI      65yo male with history of coronary artery disease, kidney stones, carotid disease, stroke, hepatitis, hypertension, hyperlipidemia, neuroendocrine carcinoma with brain metastases, right upper lobe lung resection, recent evaluation in the emergency department for right flank pain with CT showing no acute findings, urinalysis consistent with possible infection, treated for pyelonephritis, presents with concern for continuing right flank pain and fatigue.  Right flank pain 5-6 days Nausea, fatigue Not eating or drinking as much, dehydrated, no appetite, radiation has been difficult too No fevers, has had chills No shortness of breath, no chest pain Constipation mild No abdominal pain, slight radiation Not worse with deep breaths, not worse with eating or drinking No known sick contacts 6/10 pain now , is severe at other times  Past Medical History:  Diagnosis Date   Anxiety    Arthritis    Basal cell carcinoma (BCC) of forehead    CAD (coronary artery disease)    a. 10/2015 ant STEMI >> LHC with 3 v CAD; oLAD tx with POBA >> emergent CABG. b. Multiple evals since that time, early graft failure of SVG-RCA by cath 03/2016. c. 2/19 PCI/DES x1 to pRCA, normal EF.   Carotid artery disease ([place])    a. 40-59% BICA 02/2018.   Depression    Dyspnea    Ectopic atrial tachycardia (HCC)    Esophageal reflux    eosinophil esophagitis   Family history of adverse reaction to anesthesia    "sister has PONV" (06/21/2017)   Former tobacco use    Gout    Hepatitis C    "treated and cured" (06/21/2017)   High cholesterol    History of kidney stones    Hypertension    Ischemic cardiomyopathy    a. EF 25-30% at intraop TEE 4/17  //  b. Limited Echo 5/17  - EF 45-50%, mild ant HK. c. EF 55-65% by cath 09/2017.   Migraine    "3-4/yr" (06/21/2017)   Myocardial infarction ([place]) 10/2015   Palpitations    Sinus bradycardia    a. HR dropping into 40s in 02/2016 -> BB reduced.   Stroke [facility]) 10/2016   "small one; sometimes my memory/cognitive issues" (06/21/2017)   Symptomatic hypotension    a. 02/2016 ER visit -> meds reduced.   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   Acute back pain with sciatica 01/12/2021   Allergic rhinitis 01/12/2021   Amnesia 01/12/2021   Kidney stone 01/12/2021   Sciatica 01/12/2021   Bipolar disorder ([place]) 01/12/2021   S/P lobectomy of lung 12/24/2020   Solitary lung nodule 11/28/2020   Chest pain 11/07/2019   Gastroesophageal reflux disease    Cervical radiculopathy 10/17/2018   Preoperative clearance 10/04/2018   Palpitations    Coronary artery disease involving coronary bypass graft of native heart with angina pectoris (HCC)    Transient loss  of consciousness 03/24/2018   Ectopic atrial tachycardia ([place]) 02/09/2018   Central chest pain 03/10/2017   Family hx-stroke 11/10/2016   Stroke-like episode ([place]) - R brain, s/p tPA 11/09/2016   Unstable angina ([place]) 09/07/2016   Claudication of both lower extremities ([place])    Pure hypercholesterolemia    Tobacco abuse disorder    CAD of autologous artery bypass graft without angina    Chest pain at rest 06/10/2016   Abnormal nuclear stress test - HIGH RISK 04/20/2016   Old MI (myocardial infarction)    Essential hypertension 02/26/2016   Ischemic cardiomyopathy 12/25/2015    Hyperlipidemia LDL goal <70 12/25/2015   Mild tobacco abuse in early remission 11/28/2015   Coronary artery disease involving native coronary artery of native heart with angina pectoris ([place]) 11/28/2015   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   Acute MI anterior wall first episode care [facility])    Precordial chest pain 03/07/2015   Dysphagia 03/07/2015   Gout attack 03/07/2015   Mixed bipolar I disorder ([place]) 03/07/2015   Fibromyalgia 07/09/2014   Gout 07/09/2014   Anxiety 07/09/2014   Depression 07/09/2014   Hepatitis C 27/10/5007   Eosinophilic esophagitis 38/18/2993    Past Surgical History:  Procedure Laterality Date   25 GAUGE PARS PLANA VITRECTOMY WITH 20 GAUGE MVR PORT  12/31/2020   Procedure: 25 GAUGE PARS PLANA VITRECTOMY WITH 20 GAUGE MVR PORT;  Surgeon: [name], MD;  Location: [facility] OR;  Service: Thoracic;;   ANTERIOR CERVICAL DECOMP/DISCECTOMY FUSION N/A 10/17/2018   Procedure: Anterior Cervical Decompression Fusion - Cervical seven -Thoracic one;  Surgeon: Consuella Lose, MD;  Location: [place];  Service: Neurosurgery;  Laterality: N/A;   BASAL CELL CARCINOMA EXCISION     "forehead   BIOPSY  07/20/2019   Procedure: BIOPSY;  Surgeon: Carol Ada, MD;  Location: [facility];  Service: Endoscopy;;   BRONCHIAL BIOPSY  12/24/2020   Procedure: BRONCHIAL BIOPSIES;  Surgeon: Garner Nash, DO;  Location: [place] [facility];  Service: Pulmonary;;   BRONCHIAL BRUSHINGS  12/24/2020   Procedure: BRONCHIAL BRUSHINGS;  Surgeon: Garner Nash, DO;  Location: [place];  Service: Pulmonary;;   BRONCHIAL NEEDLE ASPIRATION BIOPSY  12/24/2020   Procedure: BRONCHIAL NEEDLE ASPIRATION BIOPSIES;  Surgeon: Garner Nash, DO;  Location: [place];  Service: Pulmonary;;   CARDIAC CATHETERIZATION N/A 11/28/2015   Procedure: Left Heart Cath and Coronary Angiography;  Surgeon: [name], MD;  Location: [place] CV LAB;  Service: Cardiovascular;  Laterality: N/A;   CARDIAC  CATHETERIZATION N/A 11/28/2015   Procedure: Coronary Balloon Angioplasty;  Surgeon: [name], MD;  Location: [place] [facility];  Service: Cardiovascular;  Laterality: N/A;  ostial LAD   CARDIAC CATHETERIZATION N/A 11/28/2015   Procedure: Coronary/Graft Angiography;  Surgeon: [name], MD;  Location: [place] [facility];  Service: Cardiovascular;  Laterality: N/A;  coronaries only    CARDIAC CATHETERIZATION N/A 04/21/2016   Procedure: Left Heart Cath and Coronary Angiography;  Surgeon: Wellington Hampshire, MD;  Location: [place] [facility];  Service: Cardiovascular;  Laterality: N/A;   CARDIAC CATHETERIZATION N/A 06/14/2016   Procedure: Left Heart Cath and Cors/Grafts Angiography;  Surgeon: Lorretta Harp, MD;  Location: [place] [facility];  Service: Cardiovascular;  Laterality: N/A;   CARDIAC CATHETERIZATION N/A 09/08/2016   Procedure: Left Heart Cath and Cors/Grafts Angiography;  Surgeon: Wellington Hampshire, MD;  Location: [place] [facility];  Service: Cardiovascular;  Laterality: N/A;   CARDIAC CATHETERIZATION  CORONARY ARTERY BYPASS GRAFT N/A 11/28/2015   Procedure: CORONARY ARTERY BYPASS GRAFTING (CABG) TIMES FIVE USING LEFT INTERNAL MAMMARY ARTERY AND RIGHT GREATER SAPHENOUS,VIEN HARVEATED BY ENDOVIEN, INTRAOPPRATIVE TEE;  Surgeon: Gaye Pollack, MD;  Location: [place];  Service: Open Heart Surgery;  Laterality: N/A;   CORONARY STENT INTERVENTION N/A 10/05/2017   Procedure: CORONARY STENT INTERVENTION;  Surgeon: [name], MD;  Location: [place] [facility];  Service: Cardiovascular;  Laterality: N/A;   ESOPHAGOGASTRODUODENOSCOPY (EGD) WITH PROPOFOL N/A 07/20/2019   Procedure: ESOPHAGOGASTRODUODENOSCOPY (EGD) WITH PROPOFOL;  Surgeon: Carol Ada, MD;  [facility];  Service: Endoscopy;  Laterality: N/A;   ESOPHAGOGASTRODUODENOSCOPY (EGD) WITH PROPOFOL N/A 01/30/2021   Procedure: ESOPHAGOGASTRODUODENOSCOPY (EGD) WITH PROPOFOL;  Surgeon: Carol Ada, MD;   [facility];  Service: Endoscopy;  Laterality: N/A;   FIDUCIAL MARKER PLACEMENT  12/24/2020   Procedure: FIDUCIAL DYE MARKER PLACEMENT;  Surgeon: Garner Nash, DO;  Location: [place] [facility];  Service: Pulmonary;;   HUMERUS SURGERY Right 1969   "tumor inside bone; filled it w/bone chips"   INTERCOSTAL NERVE BLOCK Right 12/24/2020   Procedure: INTERCOSTAL NERVE BLOCK;  Surgeon: [name], MD;  Location: [place];  Service: Thoracic;  Laterality: Right;   IR IMAGING GUIDED PORT INSERTION  02/06/2021   LEFT HEART CATH AND CORS/GRAFTS ANGIOGRAPHY N/A 03/11/2017   Procedure: Left Heart Cath and Cors/Grafts Angiography;  Surgeon: Leonie Man, MD;  Location: [place] [facility];  Service: Cardiovascular;  Laterality: N/A;   LEFT HEART CATH AND CORS/GRAFTS ANGIOGRAPHY N/A 10/05/2017   Procedure: LEFT HEART CATH AND CORS/GRAFTS ANGIOGRAPHY;  Surgeon: [name], MD;  Location: [place] [facility];  Service: Cardiovascular;  Laterality: N/A;   LEFT HEART CATH AND CORS/GRAFTS ANGIOGRAPHY N/A 04/11/2019   Procedure: LEFT HEART CATH AND CORS/GRAFTS ANGIOGRAPHY;  Surgeon: [name], MD;  Location: [place] [facility];  Service: Cardiovascular;  Laterality: N/A;   NODE DISSECTION Right 12/24/2020   Procedure: NODE DISSECTION;  Surgeon: [name], MD;  Location: [place];  Service: Thoracic;  Laterality: Right;   PERIPHERAL VASCULAR CATHETERIZATION N/A 06/14/2016   Procedure: Lower Extremity Angiography;  Surgeon: Lorretta Harp, MD;  Location: [place] [facility];  Service: Cardiovascular;  Laterality: N/A;   VIDEO BRONCHOSCOPY WITH ENDOBRONCHIAL NAVIGATION Right 12/24/2020   Procedure: VIDEO BRONCHOSCOPY WITH ENDOBRONCHIAL NAVIGATION;  Surgeon: Garner Nash, DO;  Location: [place];  Service: Pulmonary;  Laterality: Right;   VIDEO BRONCHOSCOPY WITH ENDOBRONCHIAL ULTRASOUND N/A 12/24/2020   Procedure: VIDEO BRONCHOSCOPY WITH ENDOBRONCHIAL ULTRASOUND;  Surgeon:  Garner Nash, DO;  Location: [place];  Service: Pulmonary;  Laterality: N/A;   VIDEO BRONCHOSCOPY WITH INSERTION OF INTERBRONCHIAL VALVE (IBV) N/A 12/31/2020   Procedure: VIDEO BRONCHOSCOPY WITH INSERTION OF INTERBRONCHIAL VALVE (IBV).VALVE IN CARTRIDGE 12mm,9mm. CHEST TUBE PLACEMENT.;  Surgeon: [name], MD;  [facility];  Service: Thoracic;  Laterality: N/A;       Family History  Problem Relation Age of Onset   Lung cancer Mother    Heart Problems Father    Heart attack Father 83   Stroke Father    Heart failure Father    Heart attack Maternal Grandmother    Stroke Maternal Grandmother    Heart attack Paternal Uncle    Hypertension Brother    Autoimmune disease Neg Hx     Social History   Tobacco Use   Smoking status: Former    Packs/day: 0.75    Years: 44.00  Pack years: 33.00    Types: Cigarettes    Quit date: 11/28/2015    Years since quitting: 5.2   Smokeless tobacco: Never  Vaping Use   Vaping Use: Never used  Substance Use Topics   Alcohol use: Yes    Comment: occ   Drug use: No    Comment: 06/21/2017 "nothing since the 1980s"    Home Medications Prior to Admission medications   Medication Sig Start Date End Date Taking? Authorizing Provider  doxycycline (VIBRAMYCIN) 100 MG capsule Take 1 capsule (100 mg total) by mouth 2 (two) times daily for 10 days. 02/14/21 02/24/21 Yes Gareth Morgan, MD  albuterol (VENTOLIN HFA) 108 (90 Base) MCG/ACT inhaler Inhale 2 puffs into the lungs every 6 (six) hours as needed for wheezing or shortness of breath. 01/12/21   Garner Nash, DO  amiodarone (PACERONE) 200 MG tablet Take 1 tablet (200 mg total) by mouth daily. Patient taking differently: Take 200 mg by mouth in the morning. 01/07/21   Elgie Collard, PA-C  amLODipine (NORVASC) 10 MG tablet TAKE 1 TABLET(10 MG) BY MOUTH DAILY Patient taking differently: Take 10 mg by mouth in the morning. 12/02/20   [name], MD  aspirin EC 81 MG  tablet Take 81 mg by mouth in the morning. Swallow whole.    [provider]  atorvastatin (LIPITOR) 80 MG tablet TAKE 1 TABLET(80 MG) BY MOUTH DAILY 12/10/19   [name], MD  cephALEXin (KEFLEX) 500 MG capsule Take 1 capsule (500 mg total) by mouth 2 (two) times daily for 14 days. 02/11/21 02/25/21  Tegeler, Gwenyth Allegra, MD  chlordiazePOXIDE (LIBRIUM) 10 MG capsule Take 10-20 mg by mouth 3 (three) times daily as needed for anxiety (sleep).    [provider]  cloNIDine (CATAPRES) 0.1 MG tablet Take 0.1 mg by mouth at bedtime. 04/09/20   [provider]  clopidogrel (PLAVIX) 75 MG tablet TAKE 1 TABLET BY MOUTH EVERY DAY Patient taking differently: Take 75 mg by mouth in the morning. 02/06/20   [name], MD  fluticasone [facility]) 50 MCG/ACT nasal spray Place 2 sprays into both nostrils daily. Patient taking differently: Place 2 sprays into both nostrils daily as needed for allergies. 01/12/21   Garner Nash, DO  lidocaine-prilocaine (EMLA) cream Apply 1 application topically as needed. 01/30/21   Heilingoetter, Cassandra L, PA-C  metoprolol succinate (TOPROL-XL) 25 MG 24 hr tablet Take 1 tablet (25 mg total) by mouth daily. Patient taking differently: Take 25 mg by mouth in the morning. Additional if needed for increase pulse rate 08/27/20   [name], MD  Multiple Vitamin (MULTIVITAMIN WITH MINERALS) TABS tablet Take 1 tablet by mouth in the morning. Centrum    [provider]  nitroGLYCERIN (NITROSTAT) 0.4 MG SL tablet PLACE 1 TABLET UNDER THE TONGUE EVERY 5 MINUTES AS NEEDED FOR CHEST PAIN. 3 DOSES MAX Patient taking differently: Place 0.4 mg under the tongue every 5 (five) minutes x 3 doses as needed for chest pain. 3 DOSES MAX 04/16/20   [name], MD  ondansetron (ZOFRAN) 8 MG tablet Take 1 tablet (8 mg total) by mouth every 8 (eight) hours as needed for nausea or vomiting. Starting 3 days after chemotherapy 02/13/21    Heilingoetter, Cassandra L, PA-C  oxyCODONE-acetaminophen (PERCOCET/ROXICET) 5-325 MG tablet Take 1 tablet by mouth every 8 (eight) hours as needed for severe pain. 02/09/21   Curt Bears, MD  oxyCODONE-acetaminophen (PERCOCET/ROXICET) 5-325 MG tablet Take 1 tablet  by mouth every 4 (four) hours as needed for severe pain. 02/11/21   Tegeler, Gwenyth Allegra, MD  prochlorperazine (COMPAZINE) 10 MG tablet Take 1 tablet (10 mg total) by mouth every 6 (six) hours as needed. Patient taking differently: Take 10 mg by mouth every 6 (six) hours as needed for nausea. 01/14/21   Heilingoetter, Cassandra L, PA-C  ranolazine (RANEXA) 1000 MG SR tablet Take 1 tablet (1,000 mg total) by mouth 2 (two) times daily. 11/08/19   [name], NP  Tiotropium Bromide-Olodaterol (STIOLTO RESPIMAT) 2.5-2.5 MCG/ACT AERS Inhale 2 puffs into the lungs daily. Patient taking differently: Inhale 1 puff into the lungs in the morning and at bedtime. 01/12/21   Garner Nash, DO    Allergies    Prednisone, Tetanus toxoids, Wellbutrin [bupropion], Indomethacin, Other, Scallops [shellfish allergy], and Chantix [varenicline]  Review of Systems   Review of Systems  Constitutional:  Positive for appetite change, chills and fatigue. Negative for fever.  HENT:  Negative for sore throat.   Eyes:  Negative for visual disturbance.  Respiratory:  Negative for shortness of breath.   Cardiovascular:  Negative for chest pain.  Gastrointestinal:  Positive for abdominal pain, nausea and vomiting. Negative for constipation and diarrhea.  Genitourinary:  Positive for flank pain. Negative for difficulty urinating and dysuria.  Musculoskeletal:  Negative for back pain and neck stiffness.  Skin:  Negative for rash.  Neurological:  Negative for syncope and headaches.   Physical Exam Updated Vital Signs BP (!) 150/90 (BP Location: Right Arm)   Pulse (!) 102   Temp 98.6 F (37 C) (Oral)   Resp 18   Ht 5\' 9"  (1.753 m)   Wt 86.2 kg    SpO2 95%   BMI 28.06 kg/m   Physical Exam Vitals and nursing note reviewed.  Constitutional:      General: He is not in acute distress.    Appearance: He is well-developed. He is not diaphoretic.  HENT:     Head: Normocephalic and atraumatic.  Eyes:     Conjunctiva/sclera: Conjunctivae normal.  Cardiovascular:     Rate and Rhythm: Regular rhythm. Tachycardia present.     Heart sounds: Normal heart sounds. No murmur heard.   No friction rub. No gallop.  Pulmonary:     Effort: Pulmonary effort is normal. No respiratory distress.     Breath sounds: Normal breath sounds. No wheezing or rales.  Abdominal:     General: There is no distension.     Palpations: Abdomen is soft.     Tenderness: There is abdominal tenderness (RUQ, +murphy). There is no guarding.  Musculoskeletal:     Cervical back: Normal range of motion.  Skin:    General: Skin is warm and dry.  Neurological:     Mental Status: He is alert and oriented to person, place, and time.    ED Results / Procedures / Treatments   Labs (all labs ordered are listed, but only abnormal results are displayed) Labs Reviewed  URINALYSIS, ROUTINE W REFLEX MICROSCOPIC - Abnormal; Notable for the following components:      Result Value   Glucose, UA 100 (*)    Protein, ur 100 (*)    All other components within normal limits  BASIC METABOLIC PANEL - Abnormal; Notable for the following components:   Glucose, Bld 110 (*)    BUN 27 (*)    All other components within normal limits  URINALYSIS, MICROSCOPIC (REFLEX) - Abnormal; Notable for the following components:  Bacteria, UA FEW (*)    All other components within normal limits  RESP PANEL BY RT-PCR (FLU A&B, COVID) ARPGX2  CULTURE, BLOOD (ROUTINE X 2)  CULTURE, BLOOD (ROUTINE X 2)  CBC  LACTIC ACID, PLASMA  LACTIC ACID, PLASMA  HEPATIC FUNCTION PANEL  LIPASE, BLOOD    EKG None  Radiology CT Angio Chest PE W and/or Wo Contrast  Result Date: 02/14/2021 CLINICAL DATA:   Recent surgery. Cancer. Right flank pain. Tachycardia. Pulmonary embolus suspected with high probability. Recent diagnosis of UTI without improvement. History of lung cancer metastatic to brain. EXAM: CT ANGIOGRAPHY CHEST CT ABDOMEN AND PELVIS WITH CONTRAST TECHNIQUE: Multidetector CT imaging of the chest was performed using the standard protocol during bolus administration of intravenous contrast. Multiplanar CT image reconstructions and MIPs were obtained to evaluate the vascular anatomy. Multidetector CT imaging of the abdomen and pelvis was performed using the standard protocol during bolus administration of intravenous contrast. CONTRAST:  129mL OMNIPAQUE IOHEXOL 350 MG/ML SOLN COMPARISON:  CT chest 12/12/2020.  CT abdomen and pelvis 02/11/2021 FINDINGS: CTA CHEST FINDINGS Cardiovascular: There is good opacification of the central and segmental pulmonary arteries. No focal filling defects. No evidence of significant pulmonary embolus. Ascending aortic aneurysm measuring 4 cm in diameter, unchanged. No aortic dissection. Great vessel origins are patent. Coronary artery and aortic calcification. Postoperative changes consistent with coronary bypass. Normal heart size. No pericardial effusions. Mediastinum/Nodes: Esophagus is decompressed. No significant lymphadenopathy in the chest. Scattered lymph nodes are not pathologically enlarged. Lungs/Pleura: Small right pleural effusion. Mild patchy areas of airspace disease in the right lung could represent early multifocal pneumonia. Left lung is clear. Previous right upper lung nodule has been resected in the interval. Musculoskeletal: Degenerative changes in the spine. Postoperative changes in the lower cervical spine. Sternotomy wires. Old rib fractures. Review of the MIP images confirms the above findings. CT ABDOMEN and PELVIS FINDINGS Hepatobiliary: Subcentimeter lesion in the dome of the liver likely representing a cavernous hemangioma. Gallbladder and bile  ducts are unremarkable. Pancreas: Unremarkable. No pancreatic ductal dilatation or surrounding inflammatory changes. Spleen: Normal in size without focal abnormality. Adrenals/Urinary Tract: No adrenal gland nodules. Cyst in the upper pole left kidney. Nephrograms are symmetrical and otherwise homogeneous. No hydronephrosis or hydroureter. Bladder is unremarkable. Stomach/Bowel: Stomach, small bowel, and colon are not abnormally distended. No wall thickening or inflammatory changes are apparent. Scattered diverticula in the sigmoid colon without evidence of diverticulitis. Appendix is normal. Vascular/Lymphatic: Aortic atherosclerosis. No enlarged abdominal or pelvic lymph nodes. Reproductive: Prostate is unremarkable. Other: No free air or free fluid in the abdomen. Abdominal wall musculature appears intact. Musculoskeletal: Degenerative changes in the spine. No destructive bone lesions. Review of the MIP images confirms the above findings. IMPRESSION: 1. No evidence of significant pulmonary embolus. 2. Small right pleural effusion. Patchy infiltrates in the right lung are nonspecific but could indicate early multifocal pneumonia. 3. No acute process demonstrated in the abdomen or pelvis. 4. Aortic atherosclerosis. Electronically Signed   By: Lucienne Capers M.D.   On: 02/14/2021 22:09   CT ABDOMEN PELVIS W CONTRAST  Result Date: 02/14/2021 CLINICAL DATA:  Recent surgery. Cancer. Right flank pain. Tachycardia. Pulmonary embolus suspected with high probability. Recent diagnosis of UTI without improvement. History of lung cancer metastatic to brain. EXAM: CT ANGIOGRAPHY CHEST CT ABDOMEN AND PELVIS WITH CONTRAST TECHNIQUE: Multidetector CT imaging of the chest was performed using the standard protocol during bolus administration of intravenous contrast. Multiplanar CT image reconstructions and MIPs were obtained to  evaluate the vascular anatomy. Multidetector CT imaging of the abdomen and pelvis was performed  using the standard protocol during bolus administration of intravenous contrast. CONTRAST:  132mL OMNIPAQUE IOHEXOL 350 MG/ML SOLN COMPARISON:  CT chest 12/12/2020.  CT abdomen and pelvis 02/11/2021 FINDINGS: CTA CHEST FINDINGS Cardiovascular: There is good opacification of the central and segmental pulmonary arteries. No focal filling defects. No evidence of significant pulmonary embolus. Ascending aortic aneurysm measuring 4 cm in diameter, unchanged. No aortic dissection. Great vessel origins are patent. Coronary artery and aortic calcification. Postoperative changes consistent with coronary bypass. Normal heart size. No pericardial effusions. Mediastinum/Nodes: Esophagus is decompressed. No significant lymphadenopathy in the chest. Scattered lymph nodes are not pathologically enlarged. Lungs/Pleura: Small right pleural effusion. Mild patchy areas of airspace disease in the right lung could represent early multifocal pneumonia. Left lung is clear. Previous right upper lung nodule has been resected in the interval. Musculoskeletal: Degenerative changes in the spine. Postoperative changes in the lower cervical spine. Sternotomy wires. Old rib fractures. Review of the MIP images confirms the above findings. CT ABDOMEN and PELVIS FINDINGS Hepatobiliary: Subcentimeter lesion in the dome of the liver likely representing a cavernous hemangioma. Gallbladder and bile ducts are unremarkable. Pancreas: Unremarkable. No pancreatic ductal dilatation or surrounding inflammatory changes. Spleen: Normal in size without focal abnormality. Adrenals/Urinary Tract: No adrenal gland nodules. Cyst in the upper pole left kidney. Nephrograms are symmetrical and otherwise homogeneous. No hydronephrosis or hydroureter. Bladder is unremarkable. Stomach/Bowel: Stomach, small bowel, and colon are not abnormally distended. No wall thickening or inflammatory changes are apparent. Scattered diverticula in the sigmoid colon without evidence of  diverticulitis. Appendix is normal. Vascular/Lymphatic: Aortic atherosclerosis. No enlarged abdominal or pelvic lymph nodes. Reproductive: Prostate is unremarkable. Other: No free air or free fluid in the abdomen. Abdominal wall musculature appears intact. Musculoskeletal: Degenerative changes in the spine. No destructive bone lesions. Review of the MIP images confirms the above findings. IMPRESSION: 1. No evidence of significant pulmonary embolus. 2. Small right pleural effusion. Patchy infiltrates in the right lung are nonspecific but could indicate early multifocal pneumonia. 3. No acute process demonstrated in the abdomen or pelvis. 4. Aortic atherosclerosis. Electronically Signed   By: Lucienne Capers M.D.   On: 02/14/2021 22:09    Procedures Procedures   Medications Ordered in ED Medications  sodium chloride 0.9 % bolus 1,000 mL (0 mLs Intravenous Stopped 02/14/21 2334)  ondansetron (ZOFRAN) injection 4 mg (4 mg Intravenous Given 02/14/21 2153)  morphine 4 MG/ML injection 4 mg (4 mg Intravenous Given 02/14/21 2153)  iohexol (OMNIPAQUE) 350 MG/ML injection 100 mL (100 mLs Intravenous Contrast Given 02/14/21 2138)  oxyCODONE (Oxy IR/ROXICODONE) immediate release tablet 5 mg (5 mg Oral Given 02/14/21 2309)  ondansetron (ZOFRAN) injection 4 mg (4 mg Intravenous Given 02/14/21 2309)    ED Course  I have reviewed the triage vital signs and the nursing notes.  Pertinent labs & imaging results that were available during my care of the patient were reviewed by me and considered in my medical decision making (see chart for details).    MDM Rules/Calculators/A&P                            65yo male with history of coronary artery disease, kidney stones, carotid disease, stroke, hepatitis, hypertension, hyperlipidemia, neuroendocrine carcinoma with brain metastases, right upper lobe lung resection, recent evaluation in the emergency department for right flank pain with CT showing no acute findings,  urinalysis consistent with possible infection, treated for pyelonephritis, presents with concern for continuing right flank pain and fatigue.  Urine culture showed no infection from days ago and suspect other etiology of pain.  Given fairly recent history of surgery and hospitalization, cancer history, tachycardia, right flank pain, ordered CT PE study for evaluation of this as etiology, and also repeated CT abdomen pelvis given right upper quadrant tenderness noted on exam.  CT returned showing a right pleural effusion, and patchy infiltrates of the right lung consistent with a possible early multifocal pneumonia.  CT abdomen without acute findings. Possible flank pain secondary to pleural effusion. No signs of sepsis in labwork, reports some chronic tachycardia since COVID and also dehydration.  Given rx for doxycycline, will continue keflex, recommend continued pain control through medications already prescribed and PCP. Patient discharged in stable condition with understanding of reasons to return.   Final Clinical Impression(s) / ED Diagnoses Final diagnoses:  RUQ abdominal pain  Right flank pain  Pleural effusion  Community acquired pneumonia of right lung, unspecified part of lung    Rx / DC Orders ED Discharge Orders          Ordered    doxycycline (VIBRAMYCIN) 100 MG capsule  2 times daily        02/14/21 2337             Gareth Morgan, MD 02/15/21 1153

## 2021-02-14 NOTE — ED Notes (Signed)
Pt taken to CT via stretcher.

## 2021-02-14 NOTE — Discharge Instructions (Addendum)
It was a pleasure taking care of you today!  Your CT shows patchy infiltrates which might are nonspecific but might represent early pneumonia.  Continue the keflex you were prescribed and begin taking the doxycycline I am prescribing today.  You may take the pain medications prescribed by previous providers or discuss continued pain control with your primary care physician.  Follow up with your oncologist and pulmonologist.

## 2021-02-14 NOTE — ED Notes (Signed)
Attempted to get a IV in triage without success.

## 2021-02-16 ENCOUNTER — Inpatient Hospital Stay: Payer: Medicare Other

## 2021-02-16 ENCOUNTER — Other Ambulatory Visit: Payer: Self-pay

## 2021-02-16 ENCOUNTER — Inpatient Hospital Stay (HOSPITAL_BASED_OUTPATIENT_CLINIC_OR_DEPARTMENT_OTHER): Payer: Medicare Other | Admitting: Physician Assistant

## 2021-02-16 ENCOUNTER — Other Ambulatory Visit: Payer: Medicare Other

## 2021-02-16 ENCOUNTER — Encounter: Payer: Self-pay | Admitting: Physician Assistant

## 2021-02-16 ENCOUNTER — Encounter (HOSPITAL_COMMUNITY): Payer: Self-pay

## 2021-02-16 ENCOUNTER — Telehealth: Payer: Self-pay

## 2021-02-16 ENCOUNTER — Inpatient Hospital Stay (HOSPITAL_COMMUNITY)
Admission: EM | Admit: 2021-02-16 | Discharge: 2021-02-19 | DRG: 683 | Disposition: A | Discharge status: Home / Self Care | Payer: Medicare Other | Attending: Family Medicine | Admitting: Family Medicine

## 2021-02-16 VITALS — BP 121/80 | HR 91 | Temp 98.6°F | Resp 19 | Ht 69.0 in | Wt 189.6 lb

## 2021-02-16 DIAGNOSIS — E7800 Pure hypercholesterolemia, unspecified: Secondary | ICD-10-CM | POA: Diagnosis present

## 2021-02-16 DIAGNOSIS — Z91013 Allergy to seafood: Secondary | ICD-10-CM

## 2021-02-16 DIAGNOSIS — T451X5A Adverse effect of antineoplastic and immunosuppressive drugs, initial encounter: Secondary | ICD-10-CM | POA: Diagnosis present

## 2021-02-16 DIAGNOSIS — F32A Depression, unspecified: Secondary | ICD-10-CM | POA: Diagnosis present

## 2021-02-16 DIAGNOSIS — C7931 Secondary malignant neoplasm of brain: Secondary | ICD-10-CM

## 2021-02-16 DIAGNOSIS — Z20822 Contact with and (suspected) exposure to covid-19: Secondary | ICD-10-CM | POA: Diagnosis present

## 2021-02-16 DIAGNOSIS — I493 Ventricular premature depolarization: Secondary | ICD-10-CM | POA: Diagnosis present

## 2021-02-16 DIAGNOSIS — B958 Unspecified staphylococcus as the cause of diseases classified elsewhere: Secondary | ICD-10-CM | POA: Diagnosis present

## 2021-02-16 DIAGNOSIS — Z8249 Family history of ischemic heart disease and other diseases of the circulatory system: Secondary | ICD-10-CM

## 2021-02-16 DIAGNOSIS — I480 Paroxysmal atrial fibrillation: Secondary | ICD-10-CM | POA: Diagnosis present

## 2021-02-16 DIAGNOSIS — Z85118 Personal history of other malignant neoplasm of bronchus and lung: Secondary | ICD-10-CM

## 2021-02-16 DIAGNOSIS — N179 Acute kidney failure, unspecified: Secondary | ICD-10-CM | POA: Diagnosis not present

## 2021-02-16 DIAGNOSIS — Z85828 Personal history of other malignant neoplasm of skin: Secondary | ICD-10-CM

## 2021-02-16 DIAGNOSIS — K219 Gastro-esophageal reflux disease without esophagitis: Secondary | ICD-10-CM | POA: Diagnosis present

## 2021-02-16 DIAGNOSIS — C3411 Malignant neoplasm of upper lobe, right bronchus or lung: Secondary | ICD-10-CM

## 2021-02-16 DIAGNOSIS — R7989 Other specified abnormal findings of blood chemistry: Secondary | ICD-10-CM

## 2021-02-16 DIAGNOSIS — Z7982 Long term (current) use of aspirin: Secondary | ICD-10-CM

## 2021-02-16 DIAGNOSIS — I2510 Atherosclerotic heart disease of native coronary artery without angina pectoris: Secondary | ICD-10-CM | POA: Diagnosis present

## 2021-02-16 DIAGNOSIS — Z801 Family history of malignant neoplasm of trachea, bronchus and lung: Secondary | ICD-10-CM

## 2021-02-16 DIAGNOSIS — C7A8 Other malignant neuroendocrine tumors: Secondary | ICD-10-CM | POA: Diagnosis present

## 2021-02-16 DIAGNOSIS — C3491 Malignant neoplasm of unspecified part of right bronchus or lung: Secondary | ICD-10-CM | POA: Diagnosis not present

## 2021-02-16 DIAGNOSIS — Z79899 Other long term (current) drug therapy: Secondary | ICD-10-CM

## 2021-02-16 DIAGNOSIS — R7881 Bacteremia: Secondary | ICD-10-CM

## 2021-02-16 DIAGNOSIS — Z87442 Personal history of urinary calculi: Secondary | ICD-10-CM

## 2021-02-16 DIAGNOSIS — I10 Essential (primary) hypertension: Secondary | ICD-10-CM

## 2021-02-16 DIAGNOSIS — D84821 Immunodeficiency due to drugs: Secondary | ICD-10-CM | POA: Diagnosis present

## 2021-02-16 DIAGNOSIS — Z981 Arthrodesis status: Secondary | ICD-10-CM

## 2021-02-16 DIAGNOSIS — Z881 Allergy status to other antibiotic agents status: Secondary | ICD-10-CM

## 2021-02-16 DIAGNOSIS — I255 Ischemic cardiomyopathy: Secondary | ICD-10-CM | POA: Diagnosis present

## 2021-02-16 DIAGNOSIS — F419 Anxiety disorder, unspecified: Secondary | ICD-10-CM | POA: Diagnosis present

## 2021-02-16 DIAGNOSIS — I110 Hypertensive heart disease with heart failure: Secondary | ICD-10-CM | POA: Diagnosis present

## 2021-02-16 DIAGNOSIS — Z951 Presence of aortocoronary bypass graft: Secondary | ICD-10-CM

## 2021-02-16 DIAGNOSIS — Z923 Personal history of irradiation: Secondary | ICD-10-CM

## 2021-02-16 DIAGNOSIS — Z823 Family history of stroke: Secondary | ICD-10-CM

## 2021-02-16 DIAGNOSIS — E663 Overweight: Secondary | ICD-10-CM | POA: Diagnosis present

## 2021-02-16 DIAGNOSIS — Z888 Allergy status to other drugs, medicaments and biological substances status: Secondary | ICD-10-CM

## 2021-02-16 DIAGNOSIS — Z87891 Personal history of nicotine dependence: Secondary | ICD-10-CM

## 2021-02-16 DIAGNOSIS — I5042 Chronic combined systolic (congestive) and diastolic (congestive) heart failure: Secondary | ICD-10-CM | POA: Diagnosis present

## 2021-02-16 DIAGNOSIS — Z7902 Long term (current) use of antithrombotics/antiplatelets: Secondary | ICD-10-CM

## 2021-02-16 DIAGNOSIS — I252 Old myocardial infarction: Secondary | ICD-10-CM

## 2021-02-16 DIAGNOSIS — R112 Nausea with vomiting, unspecified: Secondary | ICD-10-CM

## 2021-02-16 DIAGNOSIS — Z95828 Presence of other vascular implants and grafts: Secondary | ICD-10-CM

## 2021-02-16 DIAGNOSIS — M5124 Other intervertebral disc displacement, thoracic region: Secondary | ICD-10-CM | POA: Diagnosis present

## 2021-02-16 DIAGNOSIS — Z8673 Personal history of transient ischemic attack (TIA), and cerebral infarction without residual deficits: Secondary | ICD-10-CM

## 2021-02-16 LAB — CBC WITH DIFFERENTIAL (CANCER CENTER ONLY)
Abs Immature Granulocytes: 0.18 10*3/uL — ABNORMAL HIGH (ref 0.00–0.07)
Basophils Absolute: 0 10*3/uL (ref 0.0–0.1)
Basophils Relative: 0 %
Eosinophils Absolute: 0.1 10*3/uL (ref 0.0–0.5)
Eosinophils Relative: 2 %
HCT: 33.8 % — ABNORMAL LOW (ref 39.0–52.0)
Hemoglobin: 11.2 g/dL — ABNORMAL LOW (ref 13.0–17.0)
Immature Granulocytes: 3 %
Lymphocytes Relative: 33 %
Lymphs Abs: 1.8 10*3/uL (ref 0.7–4.0)
MCH: 29.6 pg (ref 26.0–34.0)
MCHC: 33.1 g/dL (ref 30.0–36.0)
MCV: 89.2 fL (ref 80.0–100.0)
Monocytes Absolute: 0.1 10*3/uL (ref 0.1–1.0)
Monocytes Relative: 2 %
Neutro Abs: 3.3 10*3/uL (ref 1.7–7.7)
Neutrophils Relative %: 60 %
Platelet Count: 221 10*3/uL (ref 150–400)
RBC: 3.79 MIL/uL — ABNORMAL LOW (ref 4.22–5.81)
RDW: 11.9 % (ref 11.5–15.5)
WBC Count: 5.5 10*3/uL (ref 4.0–10.5)
nRBC: 0 % (ref 0.0–0.2)

## 2021-02-16 LAB — MAGNESIUM: Magnesium: 1.9 mg/dL (ref 1.7–2.4)

## 2021-02-16 LAB — CMP (CANCER CENTER ONLY)
ALT: 25 U/L (ref 0–44)
AST: 20 U/L (ref 15–41)
Albumin: 3.8 g/dL (ref 3.5–5.0)
Alkaline Phosphatase: 62 U/L (ref 38–126)
Anion gap: 8 (ref 5–15)
BUN: 41 mg/dL — ABNORMAL HIGH (ref 8–23)
CO2: 27 mmol/L (ref 22–32)
Calcium: 9.3 mg/dL (ref 8.9–10.3)
Chloride: 100 mmol/L (ref 98–111)
Creatinine: 1.52 mg/dL — ABNORMAL HIGH (ref 0.61–1.24)
GFR, Estimated: 51 mL/min — ABNORMAL LOW (ref >60)
Glucose, Bld: 102 mg/dL — ABNORMAL HIGH (ref 70–99)
Potassium: 4 mmol/L (ref 3.5–5.1)
Sodium: 135 mmol/L (ref 135–145)
Total Bilirubin: 0.9 mg/dL (ref 0.3–1.2)
Total Protein: 6.8 g/dL (ref 6.5–8.1)

## 2021-02-16 LAB — LACTIC ACID, PLASMA: Lactic Acid, Venous: 1.1 mmol/L (ref 0.5–1.9)

## 2021-02-16 LAB — BLOOD CULTURE ID PANEL (REFLEXED) - BCID2

## 2021-02-16 LAB — RESP PANEL BY RT-PCR (FLU A&B, COVID) ARPGX2
Influenza A by PCR: NEGATIVE
Influenza B by PCR: NEGATIVE
SARS Coronavirus 2 by RT PCR: NEGATIVE

## 2021-02-16 MED ORDER — ONDANSETRON HCL 4 MG PO TABS
4.0000 mg | ORAL_TABLET | Freq: Four times a day (QID) | ORAL | Status: DC | PRN
  Filled 2021-02-16: qty 1

## 2021-02-16 MED ORDER — ATORVASTATIN CALCIUM 40 MG PO TABS
80.0000 mg | ORAL_TABLET | Freq: Every day | ORAL | Status: DC
  Administered 2021-02-17 – 2021-02-19 (×3): 80 mg via ORAL
  Filled 2021-02-16 (×3): qty 2

## 2021-02-16 MED ORDER — ALBUTEROL SULFATE (2.5 MG/3ML) 0.083% IN NEBU: 2.5000 mg | INHALATION_SOLUTION | Freq: Four times a day (QID) | RESPIRATORY_TRACT | Status: DC | PRN

## 2021-02-16 MED ORDER — CHLORDIAZEPOXIDE HCL 10 MG PO CAPS
10.0000 mg | ORAL_CAPSULE | Freq: Once | ORAL | Status: AC
  Administered 2021-02-16: 10 mg via ORAL
  Filled 2021-02-16: qty 1

## 2021-02-16 MED ORDER — FLUTICASONE PROPIONATE 50 MCG/ACT NA SUSP: 2.0000 | Freq: Every day | NASAL | Status: DC | PRN

## 2021-02-16 MED ORDER — LACTATED RINGERS IV SOLN: INTRAVENOUS | Status: DC

## 2021-02-16 MED ORDER — ACETAMINOPHEN 325 MG PO TABS
650.0000 mg | ORAL_TABLET | Freq: Four times a day (QID) | ORAL | Status: DC | PRN
  Administered 2021-02-17 (×2): 650 mg via ORAL
  Filled 2021-02-16 (×2): qty 2

## 2021-02-16 MED ORDER — HEPARIN SOD (PORK) LOCK FLUSH 100 UNIT/ML IV SOLN
500.0000 [IU] | INTRAVENOUS | Status: AC | PRN
  Administered 2021-02-16: 500 [IU]
  Filled 2021-02-16: qty 5

## 2021-02-16 MED ORDER — BISACODYL 5 MG PO TBEC: 5.0000 mg | DELAYED_RELEASE_TABLET | Freq: Every day | ORAL | Status: DC | PRN

## 2021-02-16 MED ORDER — VANCOMYCIN HCL IN DEXTROSE 1-5 GM/200ML-% IV SOLN
1000.0000 mg | Freq: Once | INTRAVENOUS | Status: AC
  Administered 2021-02-16: 1000 mg via INTRAVENOUS
  Filled 2021-02-16: qty 200

## 2021-02-16 MED ORDER — OXYCODONE HCL 5 MG PO TABS
5.0000 mg | ORAL_TABLET | Freq: Four times a day (QID) | ORAL | Status: DC | PRN
  Administered 2021-02-16 – 2021-02-17 (×3): 5 mg via ORAL
  Filled 2021-02-16 (×3): qty 1

## 2021-02-16 MED ORDER — UMECLIDINIUM-VILANTEROL 62.5-25 MCG/INH IN AEPB
1.0000 | INHALATION_SPRAY | Freq: Every day | RESPIRATORY_TRACT | Status: DC
  Filled 2021-02-16: qty 14

## 2021-02-16 MED ORDER — ONDANSETRON HCL 4 MG/2ML IJ SOLN
4.0000 mg | Freq: Four times a day (QID) | INTRAMUSCULAR | Status: DC | PRN
  Administered 2021-02-17 – 2021-02-19 (×8): 4 mg via INTRAVENOUS
  Filled 2021-02-16 (×8): qty 2

## 2021-02-16 MED ORDER — RANOLAZINE ER 500 MG PO TB12
1000.0000 mg | ORAL_TABLET | Freq: Two times a day (BID) | ORAL | Status: DC
  Administered 2021-02-16 – 2021-02-19 (×6): 1000 mg via ORAL
  Filled 2021-02-16 (×8): qty 2

## 2021-02-16 MED ORDER — ADULT MULTIVITAMIN W/MINERALS CH
1.0000 | ORAL_TABLET | Freq: Every day | ORAL | Status: DC
  Administered 2021-02-17 – 2021-02-19 (×3): 1 via ORAL
  Filled 2021-02-16 (×3): qty 1

## 2021-02-16 MED ORDER — ACETAMINOPHEN 650 MG RE SUPP: 650.0000 mg | Freq: Four times a day (QID) | RECTAL | Status: DC | PRN

## 2021-02-16 MED ORDER — ENOXAPARIN SODIUM 40 MG/0.4ML IJ SOSY
40.0000 mg | PREFILLED_SYRINGE | INTRAMUSCULAR | Status: DC
  Administered 2021-02-16 – 2021-02-18 (×3): 40 mg via SUBCUTANEOUS
  Filled 2021-02-16 (×3): qty 0.4

## 2021-02-16 MED ORDER — PROCHLORPERAZINE MALEATE 10 MG PO TABS
10.0000 mg | ORAL_TABLET | Freq: Four times a day (QID) | ORAL | Status: DC | PRN
  Administered 2021-02-17 – 2021-02-18 (×2): 10 mg via ORAL
  Filled 2021-02-16 (×3): qty 1

## 2021-02-16 MED ORDER — METOPROLOL SUCCINATE ER 25 MG PO TB24
25.0000 mg | ORAL_TABLET | Freq: Every day | ORAL | Status: DC
  Administered 2021-02-17 – 2021-02-19 (×3): 25 mg via ORAL
  Filled 2021-02-16 (×3): qty 1

## 2021-02-16 MED ORDER — ASPIRIN EC 81 MG PO TBEC
81.0000 mg | DELAYED_RELEASE_TABLET | Freq: Every day | ORAL | Status: DC
  Administered 2021-02-17 – 2021-02-19 (×3): 81 mg via ORAL
  Filled 2021-02-16 (×3): qty 1

## 2021-02-16 MED ORDER — SODIUM CHLORIDE 0.9% FLUSH
10.0000 mL | INTRAVENOUS | Status: AC | PRN
Start: 2021-02-16 — End: 2021-02-16
  Administered 2021-02-16: 10 mL
  Filled 2021-02-16: qty 10

## 2021-02-16 MED ORDER — ACETAMINOPHEN 325 MG PO TABS
650.0000 mg | ORAL_TABLET | Freq: Once | ORAL | Status: AC
  Administered 2021-02-16: 650 mg via ORAL
  Filled 2021-02-16: qty 2

## 2021-02-16 MED ORDER — POLYETHYLENE GLYCOL 3350 17 G PO PACK: 17.0000 g | PACK | Freq: Every day | ORAL | Status: DC | PRN

## 2021-02-16 MED ORDER — CLOPIDOGREL BISULFATE 75 MG PO TABS
75.0000 mg | ORAL_TABLET | Freq: Every day | ORAL | Status: DC
  Administered 2021-02-17 – 2021-02-19 (×3): 75 mg via ORAL
  Filled 2021-02-16 (×3): qty 1

## 2021-02-16 MED ORDER — VANCOMYCIN HCL 1500 MG/300ML IV SOLN
1500.0000 mg | INTRAVENOUS | Status: DC
  Administered 2021-02-17 – 2021-02-18 (×2): 1500 mg via INTRAVENOUS
  Filled 2021-02-16 (×2): qty 300

## 2021-02-16 MED ORDER — AMIODARONE HCL 200 MG PO TABS
200.0000 mg | ORAL_TABLET | Freq: Every day | ORAL | Status: DC
  Administered 2021-02-17 – 2021-02-19 (×3): 200 mg via ORAL
  Filled 2021-02-16 (×3): qty 1

## 2021-02-16 NOTE — H&P (Signed)
History and Physical    [name] QVZ:[id number] DOB: [birth year] DOA: 02/16/2021  PCP: [name], MD Patient coming from: Home.  Chief Complaint: Abnormal blood culture  HPI: [name] is a 65 y.o. 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 fib, CVA, anxiety and depression directed to ED by oncology with abnormal blood culture.  Patient presented to ED on 6/18 for persistent right flank pain since she had lobectomy.  He had CT chest/abdomen/pelvis that showed patchy infiltrate in right lung and a small right pleural effusion.  Blood culture was obtained and he was discharged on doxycycline in addition to his Keflex.    Patient was at oncology office for follow-up visit today, and directed to ED due to abnormal blood culture from 6/18 that showed staph species in 1 out of 2 bottles.   Patient reports poor p.o. intake and fatigue after his first radiation to his brain on 6/17.  He says he was wiped out the next day and did not have good appetite.  However, his symptoms improved yesterday and today.  He reports intermittent nausea, emesis and cough.  These symptoms are not new.  He says his cough was "deep and dry".  Now he is bringing up some whitish phlegm.  He denies shortness of breath.  Emesis was nonbloody and happens about once a day.  Last emesis was this morning.  He also reports persistent right flank pain unchanged from his baseline.  He rates his pain 5/10.  He describes the pain as sharp and achy.  No provoking factor.  Alleviated by pain Tylenol and Percocet.  He denies fever, diarrhea, melena, hematochezia or abdominal pain.  He denies UTI symptoms or hematuria.  He denies fever but admits to chills.  He denies any skin color change around his port a cath.  He denies chest pain,  shortness of breath or URI symptoms.  Denies focal neuro symptoms.  He denies NSAID use other than baby aspirin.  Patient lives with his wife.  Lifelong smoker until he had heart attack in 2017.  Admits to occasional alcohol socially.  Denies recreational drug use.  Prefers to be full code.  In ED, normal vital signs. Cr 1.52 from 1.092 days prior.  BUN 41.  Glucose 102.  Hgb 11.2.  Otherwise, CBC and CMP without significant finding.  Lactic acid normal.  Blood cultures ordered.  Started on IV fluid and vancomycin and hospitalist service called for admission for abnormal blood culture.   ROS All review of system negative except for pertinent positives and negatives as history of present illness above.  PMH Past Medical History:  Diagnosis Date   Anxiety    Arthritis    Basal cell carcinoma (BCC) of forehead    CAD (coronary artery disease)    a. 10/2015 ant STEMI >> LHC with 3 v CAD; oLAD tx with POBA >> emergent CABG. b. Multiple evals since that time, early graft failure of SVG-RCA by cath 03/2016. c. 2/19 PCI/DES x1 to pRCA, normal EF.   Carotid artery disease ([place])    a. 40-59% BICA 02/2018.   Depression    Dyspnea    Ectopic atrial tachycardia (HCC)    Esophageal reflux    eosinophil esophagitis   Family history of adverse reaction to anesthesia    "  sister has PONV" (06/21/2017)   Former tobacco use    Gout    Hepatitis C    "treated and cured" (06/21/2017)   High cholesterol    History of kidney stones    Hypertension    Ischemic cardiomyopathy    a. EF 25-30% at intraop TEE 4/17  //  b. Limited Echo 5/17 - EF 45-50%, mild ant HK. c. EF 55-65% by cath 09/2017.   Migraine    "3-4/yr" (06/21/2017)   Myocardial infarction ([place]) 10/2015   Palpitations    Sinus bradycardia    a. HR dropping into 40s in 02/2016 -> BB reduced.   Stroke [facility]) 10/2016   "small one; sometimes my memory/cognitive issues" (06/21/2017)   Symptomatic hypotension    a. 02/2016 ER visit -> meds reduced.    Syncope    Wears dentures    Wears glasses    PSH Past Surgical History:  Procedure Laterality Date   25 GAUGE PARS PLANA VITRECTOMY WITH 20 GAUGE MVR PORT  12/31/2020   Procedure: 25 GAUGE PARS PLANA VITRECTOMY WITH 20 GAUGE MVR PORT;  Surgeon: [name], MD;  [facility]: [facility];  Service: Thoracic;;   ANTERIOR CERVICAL DECOMP/DISCECTOMY FUSION N/A 10/17/2018   Procedure: Anterior Cervical Decompression Fusion - Cervical seven -Thoracic one;  Surgeon: Consuella Lose, MD;  Location: [place];  Service: Neurosurgery;  Laterality: N/A;   BASAL CELL CARCINOMA EXCISION     "forehead   BIOPSY  07/20/2019   Procedure: BIOPSY;  Surgeon: Carol Ada, MD;  [facility]: [facility];  Service: Endoscopy;;   BRONCHIAL BIOPSY  12/24/2020   Procedure: BRONCHIAL BIOPSIES;  Surgeon: Garner Nash, DO;  Location: [place] [facility];  Service: Pulmonary;;   BRONCHIAL BRUSHINGS  12/24/2020   Procedure: BRONCHIAL BRUSHINGS;  Surgeon: Garner Nash, DO;  Location: [place];  Service: Pulmonary;;   BRONCHIAL NEEDLE ASPIRATION BIOPSY  12/24/2020   Procedure: BRONCHIAL NEEDLE ASPIRATION BIOPSIES;  Surgeon: Garner Nash, DO;  Location: [place];  Service: Pulmonary;;   CARDIAC CATHETERIZATION N/A 11/28/2015   Procedure: Left Heart Cath and Coronary Angiography;  Surgeon: [name], MD;  Location: [place] [facility];  Service: Cardiovascular;  Laterality: N/A;   CARDIAC CATHETERIZATION N/A 11/28/2015   Procedure: Coronary Balloon Angioplasty;  Surgeon: [name], MD;  Location: [place] [facility];  Service: Cardiovascular;  Laterality: N/A;  ostial LAD   CARDIAC CATHETERIZATION N/A 11/28/2015   Procedure: Coronary/Graft Angiography;  Surgeon: [name], MD;  Location: [place] [facility];  Service: Cardiovascular;  Laterality: N/A;  coronaries only    CARDIAC CATHETERIZATION N/A 04/21/2016   Procedure: Left Heart Cath and Coronary Angiography;  Surgeon: Wellington Hampshire,  MD;  Location: [place] [facility];  Service: Cardiovascular;  Laterality: N/A;   CARDIAC CATHETERIZATION N/A 06/14/2016   Procedure: Left Heart Cath and Cors/Grafts Angiography;  Surgeon: Lorretta Harp, MD;  Location: [place] [facility];  Service: Cardiovascular;  Laterality: N/A;   CARDIAC CATHETERIZATION N/A 09/08/2016   Procedure: Left Heart Cath and Cors/Grafts Angiography;  Surgeon: Wellington Hampshire, MD;  Location: [place] [facility];  Service: Cardiovascular;  Laterality: N/A;   CARDIAC CATHETERIZATION     CORONARY ARTERY BYPASS GRAFT N/A 11/28/2015   Procedure: CORONARY ARTERY BYPASS GRAFTING (CABG) TIMES FIVE USING LEFT INTERNAL MAMMARY ARTERY AND RIGHT GREATER SAPHENOUS,VIEN HARVEATED BY ENDOVIEN, INTRAOPPRATIVE TEE;  Surgeon: Gaye Pollack, MD;  Location: [place];  Service: Open Heart Surgery;  Laterality: N/A;  CORONARY STENT INTERVENTION N/A 10/05/2017   Procedure: CORONARY STENT INTERVENTION;  Surgeon: [name], MD;  Location: [place] [facility];  Service: Cardiovascular;  Laterality: N/A;   ESOPHAGOGASTRODUODENOSCOPY (EGD) WITH PROPOFOL N/A 07/20/2019   Procedure: ESOPHAGOGASTRODUODENOSCOPY (EGD) WITH PROPOFOL;  Surgeon: Carol Ada, MD;  [facility];  Service: Endoscopy;  Laterality: N/A;   ESOPHAGOGASTRODUODENOSCOPY (EGD) WITH PROPOFOL N/A 01/30/2021   Procedure: ESOPHAGOGASTRODUODENOSCOPY (EGD) WITH PROPOFOL;  Surgeon: Carol Ada, MD;  [facility];  Service: Endoscopy;  Laterality: N/A;   FIDUCIAL MARKER PLACEMENT  12/24/2020   Procedure: FIDUCIAL DYE MARKER PLACEMENT;  Surgeon: Garner Nash, DO;  Location: [place] [facility];  Service: Pulmonary;;   HUMERUS SURGERY Right 1969   "tumor inside bone; filled it w/bone chips"   INTERCOSTAL NERVE BLOCK Right 12/24/2020   Procedure: INTERCOSTAL NERVE BLOCK;  Surgeon: [name], MD;  Location: [place];  Service: Thoracic;  Laterality: Right;   IR IMAGING GUIDED PORT INSERTION  02/06/2021   LEFT  HEART CATH AND CORS/GRAFTS ANGIOGRAPHY N/A 03/11/2017   Procedure: Left Heart Cath and Cors/Grafts Angiography;  Surgeon: Leonie Man, MD;  Location: [place] [facility];  Service: Cardiovascular;  Laterality: N/A;   LEFT HEART CATH AND CORS/GRAFTS ANGIOGRAPHY N/A 10/05/2017   Procedure: LEFT HEART CATH AND CORS/GRAFTS ANGIOGRAPHY;  Surgeon: [name], MD;  Location: [place] [facility];  Service: Cardiovascular;  Laterality: N/A;   LEFT HEART CATH AND CORS/GRAFTS ANGIOGRAPHY N/A 04/11/2019   Procedure: LEFT HEART CATH AND CORS/GRAFTS ANGIOGRAPHY;  Surgeon: [name], MD;  Location: [place] [facility];  Service: Cardiovascular;  Laterality: N/A;   NODE DISSECTION Right 12/24/2020   Procedure: NODE DISSECTION;  Surgeon: [name], MD;  Location: [place];  Service: Thoracic;  Laterality: Right;   PERIPHERAL VASCULAR CATHETERIZATION N/A 06/14/2016   Procedure: Lower Extremity Angiography;  Surgeon: Lorretta Harp, MD;  Location: [place] [facility];  Service: Cardiovascular;  Laterality: N/A;   VIDEO BRONCHOSCOPY WITH ENDOBRONCHIAL NAVIGATION Right 12/24/2020   Procedure: VIDEO BRONCHOSCOPY WITH ENDOBRONCHIAL NAVIGATION;  Surgeon: Garner Nash, DO;  Location: [place];  Service: Pulmonary;  Laterality: Right;   VIDEO BRONCHOSCOPY WITH ENDOBRONCHIAL ULTRASOUND N/A 12/24/2020   Procedure: VIDEO BRONCHOSCOPY WITH ENDOBRONCHIAL ULTRASOUND;  Surgeon: Garner Nash, DO;  Location: [place];  Service: Pulmonary;  Laterality: N/A;   VIDEO BRONCHOSCOPY WITH INSERTION OF INTERBRONCHIAL VALVE (IBV) N/A 12/31/2020   Procedure: VIDEO BRONCHOSCOPY WITH INSERTION OF INTERBRONCHIAL VALVE (IBV).VALVE IN CARTRIDGE 29mm,9mm. CHEST TUBE PLACEMENT.;  Surgeon: [name], MD;  [facility];  Service: Thoracic;  Laterality: N/A;   Fam HX Family History  Problem Relation Age of Onset   Lung cancer Mother    Heart Problems Father    Heart attack Father 62   Stroke  Father    Heart failure Father    Heart attack Maternal Grandmother    Stroke Maternal Grandmother    Heart attack Paternal Uncle    Hypertension Brother    Autoimmune disease Neg Hx     Social Hx  reports that he quit smoking about 5 years ago. His smoking use included cigarettes. He has a 33.00 pack-year smoking history. He has never used smokeless tobacco. He reports current alcohol use. He reports that he does not use drugs.  Allergy Allergies  Allergen Reactions   Prednisone Other (See Comments)    States that this med makes him "crazy"   Tetanus Toxoids Swelling and Other (See  Comments)    Fever, Swelling of the arm    Wellbutrin [Bupropion] Other (See Comments)    Crazy thoughts, nightmares   Indomethacin Other (See Comments)    rectal bleeding   Other     Other reaction(s): Unknown   Scallops [Shellfish Allergy] Nausea Only   Varenicline Other (See Comments)    Dreams Other reaction(s): Unknown   Home Meds Prior to Admission medications   Medication Sig Start Date End Date Taking? Authorizing Provider  albuterol (VENTOLIN HFA) 108 (90 Base) MCG/ACT inhaler Inhale 2 puffs into the lungs every 6 (six) hours as needed for wheezing or shortness of breath. 01/12/21   Garner Nash, DO  amiodarone (PACERONE) 200 MG tablet Take 1 tablet (200 mg total) by mouth daily. Patient taking differently: Take 200 mg by mouth in the morning. 01/07/21   Elgie Collard, PA-C  amLODipine (NORVASC) 10 MG tablet TAKE 1 TABLET(10 MG) BY MOUTH DAILY Patient taking differently: Take 10 mg by mouth in the morning. 12/02/20   [name], MD  aspirin EC 81 MG tablet Take 81 mg by mouth in the morning. Swallow whole.    [provider]  atorvastatin (LIPITOR) 80 MG tablet TAKE 1 TABLET(80 MG) BY MOUTH DAILY 12/10/19   [name], MD  cephALEXin (KEFLEX) 500 MG capsule Take 1 capsule (500 mg total) by mouth 2 (two) times daily for 14 days. 02/11/21 02/25/21  Tegeler,  Gwenyth Allegra, MD  chlordiazePOXIDE (LIBRIUM) 10 MG capsule Take 10-20 mg by mouth 3 (three) times daily as needed for anxiety (sleep).    [provider]  cloNIDine (CATAPRES) 0.1 MG tablet Take 0.1 mg by mouth at bedtime. 04/09/20   [provider]  clopidogrel (PLAVIX) 75 MG tablet TAKE 1 TABLET BY MOUTH EVERY DAY Patient taking differently: Take 75 mg by mouth in the morning. 02/06/20   [name], MD  doxycycline (VIBRAMYCIN) 100 MG capsule Take 1 capsule (100 mg total) by mouth 2 (two) times daily for 10 days. 02/14/21 02/24/21  Gareth Morgan, MD  fluticasone (FLONASE) 50 MCG/ACT nasal spray Place 2 sprays into both nostrils daily. Patient taking differently: Place 2 sprays into both nostrils daily as needed for allergies. 01/12/21   Garner Nash, DO  lidocaine-prilocaine (EMLA) cream Apply 1 application topically as needed. 01/30/21   Heilingoetter, Cassandra L, PA-C  metoprolol succinate (TOPROL-XL) 25 MG 24 hr tablet Take 1 tablet (25 mg total) by mouth daily. Patient taking differently: Take 25 mg by mouth in the morning. Additional if needed for increase pulse rate 08/27/20   [name], MD  Multiple Vitamin (MULTIVITAMIN WITH MINERALS) TABS tablet Take 1 tablet by mouth in the morning. Centrum    [provider]  nitroGLYCERIN (NITROSTAT) 0.4 MG SL tablet PLACE 1 TABLET UNDER THE TONGUE EVERY 5 MINUTES AS NEEDED FOR CHEST PAIN. 3 DOSES MAX Patient taking differently: Place 0.4 mg under the tongue every 5 (five) minutes x 3 doses as needed for chest pain. 3 DOSES MAX 04/16/20   [name], MD  ondansetron (ZOFRAN) 8 MG tablet Take 1 tablet (8 mg total) by mouth every 8 (eight) hours as needed for nausea or vomiting. Starting 3 days after chemotherapy 02/13/21   Heilingoetter, Cassandra L, PA-C  prochlorperazine (COMPAZINE) 10 MG tablet Take 1 tablet (10 mg total) by mouth every 6 (six) hours as needed. Patient taking differently: Take  10 mg by mouth every 6 (six) hours as needed for  nausea. 01/14/21   Heilingoetter, Cassandra L, PA-C  ranolazine (RANEXA) 1000 MG SR tablet Take 1 tablet (1,000 mg total) by mouth 2 (two) times daily. 11/08/19   [name], NP  Tiotropium Bromide-Olodaterol (STIOLTO RESPIMAT) 2.5-2.5 MCG/ACT AERS Inhale 2 puffs into the lungs daily. Patient taking differently: Inhale 1 puff into the lungs in the morning and at bedtime. 01/12/21   Garner Nash, DO    Physical Exam: Vitals:   02/16/21 1715 02/16/21 1730 02/16/21 1745 02/16/21 1800  BP:  114/79  126/85  Pulse: 75 74 74 82  Resp:  16  16  Temp:      TempSrc:      SpO2: 100% 100% 100% 100%    GENERAL: No acute distress.  Appears well.  HEENT: MMM.  Vision and hearing grossly intact.  NECK: Supple.  No apparent JVD.  RESP: On RA.  No IWOB. Good air movement bilaterally. CVS:  RRR . Heart sounds normal.  ABD/GI/GU: Bowel sounds present. Soft.  Mild RUQ tenderness.  Murphy sign negative. MSK/EXT:  Moves extremities. No apparent deformity or edema.  SKIN: no apparent skin lesion or wound.  Small skin breaks below port a cath.  No erythema or swelling.  Healed surgical wound over right flank. NEURO: Awake, alert and oriented appropriately.  No gross deficit.  PSYCH: Calm. Normal affect.   Personally Reviewed Radiological Exams CT Angio Chest PE W and/or Wo Contrast  Result Date: 02/14/2021 CLINICAL DATA:  Recent surgery. Cancer. Right flank pain. Tachycardia. Pulmonary embolus suspected with high probability. Recent diagnosis of UTI without improvement. History of lung cancer metastatic to brain. EXAM: CT ANGIOGRAPHY CHEST CT ABDOMEN AND PELVIS WITH CONTRAST TECHNIQUE: Multidetector CT imaging of the chest was performed using the standard protocol during bolus administration of intravenous contrast. Multiplanar CT image reconstructions and MIPs were obtained to evaluate the vascular anatomy. Multidetector CT imaging of the abdomen and  pelvis was performed using the standard protocol during bolus administration of intravenous contrast. CONTRAST:  197mL OMNIPAQUE IOHEXOL 350 MG/ML SOLN COMPARISON:  CT chest 12/12/2020.  CT abdomen and pelvis 02/11/2021 FINDINGS: CTA CHEST FINDINGS Cardiovascular: There is good opacification of the central and segmental pulmonary arteries. No focal filling defects. No evidence of significant pulmonary embolus. Ascending aortic aneurysm measuring 4 cm in diameter, unchanged. No aortic dissection. Great vessel origins are patent. Coronary artery and aortic calcification. Postoperative changes consistent with coronary bypass. Normal heart size. No pericardial effusions. Mediastinum/Nodes: Esophagus is decompressed. No significant lymphadenopathy in the chest. Scattered lymph nodes are not pathologically enlarged. Lungs/Pleura: Small right pleural effusion. Mild patchy areas of airspace disease in the right lung could represent early multifocal pneumonia. Left lung is clear. Previous right upper lung nodule has been resected in the interval. Musculoskeletal: Degenerative changes in the spine. Postoperative changes in the lower cervical spine. Sternotomy wires. Old rib fractures. Review of the MIP images confirms the above findings. CT ABDOMEN and PELVIS FINDINGS Hepatobiliary: Subcentimeter lesion in the dome of the liver likely representing a cavernous hemangioma. Gallbladder and bile ducts are unremarkable. Pancreas: Unremarkable. No pancreatic ductal dilatation or surrounding inflammatory changes. Spleen: Normal in size without focal abnormality. Adrenals/Urinary Tract: No adrenal gland nodules. Cyst in the upper pole left kidney. Nephrograms are symmetrical and otherwise homogeneous. No hydronephrosis or hydroureter. Bladder is unremarkable. Stomach/Bowel: Stomach, small bowel, and colon are not abnormally distended. No wall thickening or inflammatory changes are apparent. Scattered diverticula in the sigmoid colon  without evidence of diverticulitis. Appendix is  normal. Vascular/Lymphatic: Aortic atherosclerosis. No enlarged abdominal or pelvic lymph nodes. Reproductive: Prostate is unremarkable. Other: No free air or free fluid in the abdomen. Abdominal wall musculature appears intact. Musculoskeletal: Degenerative changes in the spine. No destructive bone lesions. Review of the MIP images confirms the above findings. IMPRESSION: 1. No evidence of significant pulmonary embolus. 2. Small right pleural effusion. Patchy infiltrates in the right lung are nonspecific but could indicate early multifocal pneumonia. 3. No acute process demonstrated in the abdomen or pelvis. 4. Aortic atherosclerosis. Electronically Signed   By: Lucienne Capers M.D.   On: 02/14/2021 22:09   CT ABDOMEN PELVIS W CONTRAST  Result Date: 02/14/2021 CLINICAL DATA:  Recent surgery. Cancer. Right flank pain. Tachycardia. Pulmonary embolus suspected with high probability. Recent diagnosis of UTI without improvement. History of lung cancer metastatic to brain. EXAM: CT ANGIOGRAPHY CHEST CT ABDOMEN AND PELVIS WITH CONTRAST TECHNIQUE: Multidetector CT imaging of the chest was performed using the standard protocol during bolus administration of intravenous contrast. Multiplanar CT image reconstructions and MIPs were obtained to evaluate the vascular anatomy. Multidetector CT imaging of the abdomen and pelvis was performed using the standard protocol during bolus administration of intravenous contrast. CONTRAST:  159mL OMNIPAQUE IOHEXOL 350 MG/ML SOLN COMPARISON:  CT chest 12/12/2020.  CT abdomen and pelvis 02/11/2021 FINDINGS: CTA CHEST FINDINGS Cardiovascular: There is good opacification of the central and segmental pulmonary arteries. No focal filling defects. No evidence of significant pulmonary embolus. Ascending aortic aneurysm measuring 4 cm in diameter, unchanged. No aortic dissection. Great vessel origins are patent. Coronary artery and aortic  calcification. Postoperative changes consistent with coronary bypass. Normal heart size. No pericardial effusions. Mediastinum/Nodes: Esophagus is decompressed. No significant lymphadenopathy in the chest. Scattered lymph nodes are not pathologically enlarged. Lungs/Pleura: Small right pleural effusion. Mild patchy areas of airspace disease in the right lung could represent early multifocal pneumonia. Left lung is clear. Previous right upper lung nodule has been resected in the interval. Musculoskeletal: Degenerative changes in the spine. Postoperative changes in the lower cervical spine. Sternotomy wires. Old rib fractures. Review of the MIP images confirms the above findings. CT ABDOMEN and PELVIS FINDINGS Hepatobiliary: Subcentimeter lesion in the dome of the liver likely representing a cavernous hemangioma. Gallbladder and bile ducts are unremarkable. Pancreas: Unremarkable. No pancreatic ductal dilatation or surrounding inflammatory changes. Spleen: Normal in size without focal abnormality. Adrenals/Urinary Tract: No adrenal gland nodules. Cyst in the upper pole left kidney. Nephrograms are symmetrical and otherwise homogeneous. No hydronephrosis or hydroureter. Bladder is unremarkable. Stomach/Bowel: Stomach, small bowel, and colon are not abnormally distended. No wall thickening or inflammatory changes are apparent. Scattered diverticula in the sigmoid colon without evidence of diverticulitis. Appendix is normal. Vascular/Lymphatic: Aortic atherosclerosis. No enlarged abdominal or pelvic lymph nodes. Reproductive: Prostate is unremarkable. Other: No free air or free fluid in the abdomen. Abdominal wall musculature appears intact. Musculoskeletal: Degenerative changes in the spine. No destructive bone lesions. Review of the MIP images confirms the above findings. IMPRESSION: 1. No evidence of significant pulmonary embolus. 2. Small right pleural effusion. Patchy infiltrates in the right lung are nonspecific  but could indicate early multifocal pneumonia. 3. No acute process demonstrated in the abdomen or pelvis. 4. Aortic atherosclerosis. Electronically Signed   By: Lucienne Capers M.D.   On: 02/14/2021 22:09     Personally Reviewed Labs: CBC: Recent Labs  Lab 02/10/21 2351 02/14/21 2040 02/16/21 1425  WBC 12.4* 10.1 5.5  NEUTROABS 11.3*  --  3.3  HGB 12.3* 13.7  11.2*  HCT 37.8* 40.9 33.8*  MCV 92.0 89.5 89.2  PLT 262 283 417   Basic Metabolic Panel: Recent Labs  Lab 02/10/21 2351 02/14/21 2040 02/16/21 1425  NA 135 135 135  K 4.3 3.5 4.0  CL 104 99 100  CO2 22 27 27   GLUCOSE 136* 110* 102*  BUN 21 27* 41*  CREATININE 0.90 1.09 1.52*  CALCIUM 9.0 9.4 9.3  MG  --   --  1.9   GFR: Estimated Creatinine Clearance: 52.6 mL/min (A) (by C-G formula based on SCr of 1.52 mg/dL (H)). Liver Function Tests: Recent Labs  Lab 02/10/21 2351 02/14/21 2140 02/16/21 1425  AST 20 20 20   ALT 19 24 25   ALKPHOS 73 64 62  BILITOT 0.6 0.9 0.9  PROT 7.3 6.9 6.8  ALBUMIN 3.9 3.9 3.8   Recent Labs  Lab 02/10/21 2351 02/14/21 2140  LIPASE 20 21   No results for input(s): AMMONIA in the last 168 hours. Coagulation Profile: No results for input(s): INR, PROTIME in the last 168 hours. Cardiac Enzymes: No results for input(s): CKTOTAL, CKMB, CKMBINDEX, TROPONINI in the last 168 hours. BNP (last 3 results) No results for input(s): PROBNP in the last 8760 hours. HbA1C: No results for input(s): HGBA1C in the last 72 hours. CBG: No results for input(s): GLUCAP in the last 168 hours. Lipid Profile: No results for input(s): CHOL, HDL, LDLCALC, TRIG, CHOLHDL, LDLDIRECT in the last 72 hours. Thyroid Function Tests: No results for input(s): TSH, T4TOTAL, FREET4, T3FREE, THYROIDAB in the last 72 hours. Anemia Panel: No results for input(s): VITAMINB12, FOLATE, FERRITIN, TIBC, IRON, RETICCTPCT in the last 72 hours. Urine analysis:    Component Value Date/Time   COLORURINE YELLOW  02/14/2021 2027   APPEARANCEUR CLEAR 02/14/2021 2027   LABSPEC 1.015 02/14/2021 2027   PHURINE 7.0 02/14/2021 2027   GLUCOSEU 100 (A) 02/14/2021 2027   HGBUR NEGATIVE 02/14/2021 2027   BILIRUBINUR NEGATIVE 02/14/2021 2027   KETONESUR NEGATIVE 02/14/2021 2027   PROTEINUR 100 (A) 02/14/2021 2027   UROBILINOGEN 0.2 05/13/2014 0658   NITRITE NEGATIVE 02/14/2021 2027   LEUKOCYTESUR NEGATIVE 02/14/2021 2027    Sepsis Labs:  Lactic acid negative.  Personally Reviewed EKG:  No EKG obtained.  Assessment/Plan Abnormal blood culture: patient had blood cultures obtained as a work-up for right flank pain when he presented to ED on 6/18 that shows staph species in 1 out of 2 bottles, but not staph aureus.  He is immunocompromised due to chemotherapy but well-appearing clinically.  He has no fever or leukocytosis to suggest true infection.  I suspect this to be contaminant versus true infection.  However, he has been on doxycycline and Keflex which could mask. -Follow repeat blood culture -Continue IV vancomycin for now -ID consult in the morning   AKI/azotemia: Likely prerenal from poor p.o. intake after radiation and chemotherapy.  Not on nephrotoxic meds. -IV fluid at 75 cc an hour over the next 12 hours. 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  He has mild RUQ tenderness but negative Murphy.  LFT negative.  No leukocytosis or fever either.  Recent CT reassuring. -IV fluid and antiemetics.  History of CAD s/p CABG in 2017 and DES in 2019-stable. -Continue home medications.  History of ICM/systolic CHF with  recovered EF.  Last TTE with LVEF of 55 to 60% and G1 DD. -Decrease IV fluid to 75 cc an hour -Closely monitor fluid and respiratory  status  Paroxysmal A. fib: Rate controlled.  CHA2DS2-VASc score at least 3.  Not on anticoagulation -Continue home amiodarone and metoprolol  CVA without residual deficits: Stable. -Continue statin, Plavix and aspirin  Anxiety and depression: Stable. -Continue home medications  Right flank pain: Likely a sequela of his thoracic surgery during lobectomy.  No UTI symptoms.  No CVA tenderness. -As needed Tylenol and oxycodone  COPD?  Stable. -Inhalers.  Of note, formal medication reconciliation pending at the time of admission.  I have ordered what I believe are essential.  -Please review patient's medication after medication reconciliation.  DVT prophylaxis: Subcu Lovenox  Code Status: Full code Family Communication: Offered but patient to update his wife. Disposition Plan: Admit to MedSurg Consults called: Oncology Admission status: Observation Level of care: Med-Surg   Mercy Riding MD [facility]  If 7PM-7AM, please contact night-coverage [email or web address]  02/16/2021, 7:07 PM

## 2021-02-16 NOTE — ED Notes (Signed)
2

## 2021-02-16 NOTE — ED Provider Notes (Signed)
[facility] DEPT Provider Note   CSN: [id number] Arrival date & time: 02/16/21  1614     History Chief Complaint  Patient presents with   CA Pt   Positive Blood Culture    [name] is a 65 y.o. male.  65 year old male who presents from cancer center after having found to have positive blood cultures for staph species.  Concern for possible Port-A-Cath infection.  Seen recently for right-sided flank pain and that work-up was negative.  He denies any fevers.  No vomiting.  Denies any pain or redness to his Port-A-Cath site.  Lab work today shows no leukocytosis on his CBC.  Were sent here for further evaluation and management      Past Medical History:  Diagnosis Date   Anxiety    Arthritis    Basal cell carcinoma (BCC) of forehead    CAD (coronary artery disease)    a. 10/2015 ant STEMI >> LHC with 3 v CAD; oLAD tx with POBA >> emergent CABG. b. Multiple evals since that time, early graft failure of SVG-RCA by cath 03/2016. c. 2/19 PCI/DES x1 to pRCA, normal EF.   Carotid artery disease ([place])    a. 40-59% BICA 02/2018.   Depression    Dyspnea    Ectopic atrial tachycardia (HCC)    Esophageal reflux    eosinophil esophagitis   Family history of adverse reaction to anesthesia    "sister has PONV" (06/21/2017)   Former tobacco use    Gout    Hepatitis C    "treated and cured" (06/21/2017)   High cholesterol    History of kidney stones    Hypertension    Ischemic cardiomyopathy    a. EF 25-30% at intraop TEE 4/17  //  b. Limited Echo 5/17 - EF 45-50%, mild ant HK. c. EF 55-65% by cath 09/2017.   Migraine    "3-4/yr" (06/21/2017)   Myocardial infarction ([place]) 10/2015   Palpitations    Sinus bradycardia    a. HR dropping into 40s in 02/2016 -> BB reduced.   Stroke [facility]) 10/2016   "small one; sometimes my memory/cognitive issues" (06/21/2017)   Symptomatic hypotension    a. 02/2016 ER visit -> meds reduced.   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   Acute back pain with sciatica 01/12/2021   Allergic rhinitis 01/12/2021   Amnesia 01/12/2021   Kidney stone 01/12/2021   Sciatica 01/12/2021   Bipolar disorder ([place]) 01/12/2021   S/P lobectomy of lung 12/24/2020   Solitary lung nodule 11/28/2020   Chest pain 11/07/2019   Gastroesophageal reflux disease    Cervical radiculopathy 10/17/2018   Preoperative clearance 10/04/2018   Palpitations    Coronary artery disease involving coronary bypass graft of native heart with angina pectoris ([place])    Transient loss of consciousness 03/24/2018   Ectopic atrial tachycardia ([place]) 02/09/2018   Central chest pain 03/10/2017   Family hx-stroke 11/10/2016   Stroke-like episode ([place]) - R brain, s/p tPA 11/09/2016   Unstable angina ([place]) 09/07/2016   Claudication of both lower extremities ([place])    Pure  hypercholesterolemia    Tobacco abuse disorder    CAD of autologous artery bypass graft without angina    Chest pain at rest 06/10/2016   Abnormal nuclear stress test - HIGH RISK 04/20/2016   Old MI (myocardial infarction)    Essential hypertension 02/26/2016   Ischemic cardiomyopathy 12/25/2015   Hyperlipidemia LDL goal <70 12/25/2015   Mild tobacco abuse in early remission 11/28/2015   Coronary artery disease involving native coronary artery of native heart with angina pectoris ([place]) 11/28/2015   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   Acute MI anterior wall first episode care [facility])    Precordial chest pain 03/07/2015   Dysphagia 03/07/2015   Gout attack 03/07/2015   Mixed bipolar I disorder ([place]) 03/07/2015    Fibromyalgia 07/09/2014   Gout 07/09/2014   Anxiety 07/09/2014   Depression 07/09/2014   Hepatitis C 11/18/2246   Eosinophilic esophagitis 25/00/3704    Past Surgical History:  Procedure Laterality Date   25 GAUGE PARS PLANA VITRECTOMY WITH 20 GAUGE MVR PORT  12/31/2020   Procedure: 25 GAUGE PARS PLANA VITRECTOMY WITH 20 GAUGE MVR PORT;  Surgeon: [name], MD;  [facility]: [facility];  Service: Thoracic;;   ANTERIOR CERVICAL DECOMP/DISCECTOMY FUSION N/A 10/17/2018   Procedure: Anterior Cervical Decompression Fusion - Cervical seven -Thoracic one;  Surgeon: Consuella Lose, MD;  Location: [facility];  Service: Neurosurgery;  Laterality: N/A;   BASAL CELL CARCINOMA EXCISION     "forehead   BIOPSY  07/20/2019   Procedure: BIOPSY;  Surgeon: Carol Ada, MD;  Location: [facility];  Service: Endoscopy;;   BRONCHIAL BIOPSY  12/24/2020   Procedure: BRONCHIAL BIOPSIES;  Surgeon: Garner Nash, DO;  Location: [facility];  Service: Pulmonary;;   BRONCHIAL BRUSHINGS  12/24/2020   Procedure: BRONCHIAL BRUSHINGS;  Surgeon: Garner Nash, DO;  Location: [facility];  Service: Pulmonary;;   BRONCHIAL NEEDLE ASPIRATION BIOPSY  12/24/2020   Procedure: BRONCHIAL NEEDLE ASPIRATION BIOPSIES;  Surgeon: Garner Nash, DO;  Location: [facility];  Service: Pulmonary;;   CARDIAC CATHETERIZATION N/A 11/28/2015   Procedure: Left Heart Cath and Coronary Angiography;  Surgeon: [name], MD;  Location: [facility];  Service: Cardiovascular;  Laterality: N/A;   CARDIAC CATHETERIZATION N/A 11/28/2015   Procedure: Coronary Balloon Angioplasty;  Surgeon: [name], MD;  Location: [place] [place] [facility];  Service: Cardiovascular;  Laterality: N/A;  ostial LAD   CARDIAC CATHETERIZATION N/A 11/28/2015   Procedure: Coronary/Graft Angiography;  Surgeon: [name], MD;  Location: [facility];  Service: Cardiovascular;  Laterality: N/A;  coronaries only    CARDIAC  CATHETERIZATION N/A 04/21/2016   Procedure: Left Heart Cath and Coronary Angiography;  Surgeon: Wellington Hampshire, MD;  Location: [facility];  Service: Cardiovascular;  Laterality: N/A;   CARDIAC CATHETERIZATION N/A 06/14/2016   Procedure: Left Heart Cath and Cors/Grafts Angiography;  Surgeon: Lorretta Harp, MD;  Location: [facility];  Service: Cardiovascular;  Laterality: N/A;   CARDIAC CATHETERIZATION N/A 09/08/2016   Procedure: Left Heart Cath and Cors/Grafts Angiography;  Surgeon: Wellington Hampshire, MD;  Location: [facility];  Service: Cardiovascular;  Laterality: N/A;   CARDIAC CATHETERIZATION     CORONARY ARTERY BYPASS GRAFT N/A 11/28/2015   Procedure: CORONARY ARTERY BYPASS GRAFTING (CABG) TIMES FIVE USING LEFT INTERNAL MAMMARY ARTERY AND RIGHT GREATER SAPHENOUS,VIEN HARVEATED BY ENDOVIEN, INTRAOPPRATIVE TEE;  Surgeon: Gaye Pollack, MD;  Location: [place];  Service: Open Heart Surgery;  Laterality: N/A;   CORONARY STENT INTERVENTION N/A 10/05/2017   Procedure: CORONARY STENT INTERVENTION;  Surgeon: [name], MD;  Location: [place] [facility];  Service: Cardiovascular;  Laterality: N/A;   ESOPHAGOGASTRODUODENOSCOPY (EGD) WITH PROPOFOL N/A 07/20/2019   Procedure: ESOPHAGOGASTRODUODENOSCOPY (EGD) WITH PROPOFOL;  Surgeon: Carol Ada, MD;  [facility];  Service: Endoscopy;  Laterality: N/A;   ESOPHAGOGASTRODUODENOSCOPY (EGD) WITH PROPOFOL N/A 01/30/2021   Procedure: ESOPHAGOGASTRODUODENOSCOPY (EGD) WITH PROPOFOL;  Surgeon: Carol Ada, MD;  [facility];  Service: Endoscopy;  Laterality: N/A;   FIDUCIAL MARKER PLACEMENT  12/24/2020   Procedure: FIDUCIAL DYE MARKER PLACEMENT;  Surgeon: Garner Nash, DO;  Location: [facility];  Service: Pulmonary;;   HUMERUS SURGERY Right 1969   "tumor inside bone; filled it w/bone chips"   INTERCOSTAL NERVE BLOCK Right 12/24/2020   Procedure: INTERCOSTAL NERVE BLOCK;  Surgeon: [name], MD;   Location: [facility];  Service: Thoracic;  Laterality: Right;   IR IMAGING GUIDED PORT INSERTION  02/06/2021   LEFT HEART CATH AND CORS/GRAFTS ANGIOGRAPHY N/A 03/11/2017   Procedure: Left Heart Cath and Cors/Grafts Angiography;  Surgeon: Leonie Man, MD;  Location: [facility];  Service: Cardiovascular;  Laterality: N/A;   LEFT HEART CATH AND CORS/GRAFTS ANGIOGRAPHY N/A 10/05/2017   Procedure: LEFT HEART CATH AND CORS/GRAFTS ANGIOGRAPHY;  Surgeon: [name], MD;  Location: [facility];  Service: Cardiovascular;  Laterality: N/A;   LEFT HEART CATH AND CORS/GRAFTS ANGIOGRAPHY N/A 04/11/2019   Procedure: LEFT HEART CATH AND CORS/GRAFTS ANGIOGRAPHY;  Surgeon: [name], MD;  Location: [facility];  Service: Cardiovascular;  Laterality: N/A;   NODE DISSECTION Right 12/24/2020   Procedure: NODE DISSECTION;  Surgeon: [name], MD;  Location: [facility];  Service: Thoracic;  Laterality: Right;   PERIPHERAL VASCULAR CATHETERIZATION N/A 06/14/2016   Procedure: Lower Extremity Angiography;  Surgeon: Lorretta Harp, MD;  Location: [facility];  Service: Cardiovascular;  Laterality: N/A;   VIDEO BRONCHOSCOPY WITH ENDOBRONCHIAL NAVIGATION Right 12/24/2020   Procedure: VIDEO BRONCHOSCOPY WITH ENDOBRONCHIAL NAVIGATION;  Surgeon: Garner Nash, DO;  Location: [facility];  Service: Pulmonary;  Laterality: Right;   VIDEO BRONCHOSCOPY WITH ENDOBRONCHIAL ULTRASOUND N/A 12/24/2020   Procedure: VIDEO BRONCHOSCOPY WITH ENDOBRONCHIAL ULTRASOUND;  Surgeon: Garner Nash, DO;  Location: [facility];  Service: Pulmonary;  Laterality: N/A;   VIDEO BRONCHOSCOPY WITH INSERTION OF INTERBRONCHIAL VALVE (IBV) N/A 12/31/2020   Procedure: VIDEO BRONCHOSCOPY WITH INSERTION OF INTERBRONCHIAL VALVE (IBV).VALVE IN CARTRIDGE 58mm,9mm. CHEST TUBE PLACEMENT.;  Surgeon: [name], MD;  [facility];  Service: Thoracic;  Laterality: N/A;       Family History  Problem  Relation Age of Onset   Lung cancer Mother    Heart Problems Father    Heart attack Father 90   Stroke Father    Heart failure Father    Heart attack Maternal Grandmother    Stroke Maternal Grandmother    Heart attack Paternal Uncle    Hypertension Brother    Autoimmune disease Neg Hx     Social History   Tobacco Use   Smoking status: Former    Packs/day: 0.75    Years: 44.00    Pack years: 33.00    Types: Cigarettes    Quit date: 11/28/2015    Years since quitting: 5.2   Smokeless tobacco: Never  Vaping Use   Vaping Use: Never used  Substance Use Topics   Alcohol use:  Yes    Comment: occ   Drug use: No    Comment: 06/21/2017 "nothing since the 1980s"    Home Medications Prior to Admission medications   Medication Sig Start Date End Date Taking? Authorizing Provider  albuterol (VENTOLIN HFA) 108 (90 Base) MCG/ACT inhaler Inhale 2 puffs into the lungs every 6 (six) hours as needed for wheezing or shortness of breath. 01/12/21   Garner Nash, DO  amiodarone (PACERONE) 200 MG tablet Take 1 tablet (200 mg total) by mouth daily. Patient taking differently: Take 200 mg by mouth in the morning. 01/07/21   Elgie Collard, PA-C  amLODipine (NORVASC) 10 MG tablet TAKE 1 TABLET(10 MG) BY MOUTH DAILY Patient taking differently: Take 10 mg by mouth in the morning. 12/02/20   [name], MD  aspirin EC 81 MG tablet Take 81 mg by mouth in the morning. Swallow whole.    [provider]  atorvastatin (LIPITOR) 80 MG tablet TAKE 1 TABLET(80 MG) BY MOUTH DAILY 12/10/19   [name], MD  cephALEXin (KEFLEX) 500 MG capsule Take 1 capsule (500 mg total) by mouth 2 (two) times daily for 14 days. 02/11/21 02/25/21  Tegeler, Gwenyth Allegra, MD  chlordiazePOXIDE (LIBRIUM) 10 MG capsule Take 10-20 mg by mouth 3 (three) times daily as needed for anxiety (sleep).    [provider]  cloNIDine (CATAPRES) 0.1 MG tablet Take 0.1 mg by mouth at bedtime. 04/09/20    [provider]  clopidogrel (PLAVIX) 75 MG tablet TAKE 1 TABLET BY MOUTH EVERY DAY Patient taking differently: Take 75 mg by mouth in the morning. 02/06/20   [name], MD  doxycycline (VIBRAMYCIN) 100 MG capsule Take 1 capsule (100 mg total) by mouth 2 (two) times daily for 10 days. 02/14/21 02/24/21  Gareth Morgan, MD  fluticasone (FLONASE) 50 MCG/ACT nasal spray Place 2 sprays into both nostrils daily. Patient taking differently: Place 2 sprays into both nostrils daily as needed for allergies. 01/12/21   Garner Nash, DO  lidocaine-prilocaine (EMLA) cream Apply 1 application topically as needed. 01/30/21   Heilingoetter, Cassandra L, PA-C  metoprolol succinate (TOPROL-XL) 25 MG 24 hr tablet Take 1 tablet (25 mg total) by mouth daily. Patient taking differently: Take 25 mg by mouth in the morning. Additional if needed for increase pulse rate 08/27/20   [name], MD  Multiple Vitamin (MULTIVITAMIN WITH MINERALS) TABS tablet Take 1 tablet by mouth in the morning. Centrum    [provider]  nitroGLYCERIN (NITROSTAT) 0.4 MG SL tablet PLACE 1 TABLET UNDER THE TONGUE EVERY 5 MINUTES AS NEEDED FOR CHEST PAIN. 3 DOSES MAX Patient taking differently: Place 0.4 mg under the tongue every 5 (five) minutes x 3 doses as needed for chest pain. 3 DOSES MAX 04/16/20   [name], MD  ondansetron (ZOFRAN) 8 MG tablet Take 1 tablet (8 mg total) by mouth every 8 (eight) hours as needed for nausea or vomiting. Starting 3 days after chemotherapy 02/13/21   Heilingoetter, Cassandra L, PA-C  prochlorperazine (COMPAZINE) 10 MG tablet Take 1 tablet (10 mg total) by mouth every 6 (six) hours as needed. Patient taking differently: Take 10 mg by mouth every 6 (six) hours as needed for nausea. 01/14/21   Heilingoetter, Cassandra L, PA-C  ranolazine (RANEXA) 1000 MG SR tablet Take 1 tablet (1,000 mg total) by mouth 2 (two) times daily. 11/08/19   [name], NP  Tiotropium  Bromide-Olodaterol (STIOLTO RESPIMAT) 2.5-2.5 MCG/ACT AERS Inhale  2 puffs into the lungs daily. Patient taking differently: Inhale 1 puff into the lungs in the morning and at bedtime. 01/12/21   Garner Nash, DO    Allergies    Prednisone, Tetanus toxoids, Wellbutrin [bupropion], Indomethacin, Other, Scallops [shellfish allergy], and Varenicline  Review of Systems   Review of Systems  All other systems reviewed and are negative.  Physical Exam Updated Vital Signs BP 132/83   Pulse 73   Temp 98.1 F (36.7 C) (Oral)   Resp 16   SpO2 100%   Physical Exam Vitals and nursing note reviewed.  Constitutional:      General: He is not in acute distress.    Appearance: Normal appearance. He is well-developed. He is not toxic-appearing.  HENT:     Head: Normocephalic and atraumatic.  Eyes:     General: Lids are normal.     Conjunctiva/sclera: Conjunctivae normal.     Pupils: Pupils are equal, round, and reactive to light.  Neck:     Thyroid: No thyroid mass.     Trachea: No tracheal deviation.  Cardiovascular:     Rate and Rhythm: Normal rate and regular rhythm.     Heart sounds: Normal heart sounds. No murmur heard.   No gallop.  Pulmonary:     Effort: Pulmonary effort is normal. No respiratory distress.     Breath sounds: Normal breath sounds. No stridor. No decreased breath sounds, wheezing, rhonchi or rales.  Abdominal:     General: There is no distension.     Palpations: Abdomen is soft.     Tenderness: There is no abdominal tenderness. There is no rebound.  Musculoskeletal:        General: No tenderness. Normal range of motion.     Cervical back: Normal range of motion and neck supple.  Skin:    General: Skin is warm and dry.     Findings: No abrasion or rash.  Neurological:     Mental Status: He is alert and oriented to person, place, and time. Mental status is at baseline.     GCS: GCS eye subscore is 4. GCS verbal subscore is 5. GCS motor subscore is 6.      Cranial Nerves: Cranial nerves are intact. No cranial nerve deficit.     Sensory: No sensory deficit.     Motor: Motor function is intact.  Psychiatric:        Attention and Perception: Attention normal.        Speech: Speech normal.        Behavior: Behavior normal.    ED Results / Procedures / Treatments   Labs (all labs ordered are listed, but only abnormal results are displayed) Labs Reviewed  CULTURE, BLOOD (ROUTINE X 2)  CULTURE, BLOOD (ROUTINE X 2)  LACTIC ACID, PLASMA    EKG None  Radiology CT Angio Chest PE W and/or Wo Contrast  Result Date: 02/14/2021 CLINICAL DATA:  Recent surgery. Cancer. Right flank pain. Tachycardia. Pulmonary embolus suspected with high probability. Recent diagnosis of UTI without improvement. History of lung cancer metastatic to brain. EXAM: CT ANGIOGRAPHY CHEST CT ABDOMEN AND PELVIS WITH CONTRAST TECHNIQUE: Multidetector CT imaging of the chest was performed using the standard protocol during bolus administration of intravenous contrast. Multiplanar CT image reconstructions and MIPs were obtained to evaluate the vascular anatomy. Multidetector CT imaging of the abdomen and pelvis was performed using the standard protocol during bolus administration of intravenous contrast. CONTRAST:  170mL OMNIPAQUE IOHEXOL 350 MG/ML SOLN COMPARISON:  CT chest 12/12/2020.  CT abdomen and pelvis 02/11/2021 FINDINGS: CTA CHEST FINDINGS Cardiovascular: There is good opacification of the central and segmental pulmonary arteries. No focal filling defects. No evidence of significant pulmonary embolus. Ascending aortic aneurysm measuring 4 cm in diameter, unchanged. No aortic dissection. Great vessel origins are patent. Coronary artery and aortic calcification. Postoperative changes consistent with coronary bypass. Normal heart size. No pericardial effusions. Mediastinum/Nodes: Esophagus is decompressed. No significant lymphadenopathy in the chest. Scattered lymph nodes are not  pathologically enlarged. Lungs/Pleura: Small right pleural effusion. Mild patchy areas of airspace disease in the right lung could represent early multifocal pneumonia. Left lung is clear. Previous right upper lung nodule has been resected in the interval. Musculoskeletal: Degenerative changes in the spine. Postoperative changes in the lower cervical spine. Sternotomy wires. Old rib fractures. Review of the MIP images confirms the above findings. CT ABDOMEN and PELVIS FINDINGS Hepatobiliary: Subcentimeter lesion in the dome of the liver likely representing a cavernous hemangioma. Gallbladder and bile ducts are unremarkable. Pancreas: Unremarkable. No pancreatic ductal dilatation or surrounding inflammatory changes. Spleen: Normal in size without focal abnormality. Adrenals/Urinary Tract: No adrenal gland nodules. Cyst in the upper pole left kidney. Nephrograms are symmetrical and otherwise homogeneous. No hydronephrosis or hydroureter. Bladder is unremarkable. Stomach/Bowel: Stomach, small bowel, and colon are not abnormally distended. No wall thickening or inflammatory changes are apparent. Scattered diverticula in the sigmoid colon without evidence of diverticulitis. Appendix is normal. Vascular/Lymphatic: Aortic atherosclerosis. No enlarged abdominal or pelvic lymph nodes. Reproductive: Prostate is unremarkable. Other: No free air or free fluid in the abdomen. Abdominal wall musculature appears intact. Musculoskeletal: Degenerative changes in the spine. No destructive bone lesions. Review of the MIP images confirms the above findings. IMPRESSION: 1. No evidence of significant pulmonary embolus. 2. Small right pleural effusion. Patchy infiltrates in the right lung are nonspecific but could indicate early multifocal pneumonia. 3. No acute process demonstrated in the abdomen or pelvis. 4. Aortic atherosclerosis. Electronically Signed   By: Lucienne Capers M.D.   On: 02/14/2021 22:09   CT ABDOMEN PELVIS W  CONTRAST  Result Date: 02/14/2021 CLINICAL DATA:  Recent surgery. Cancer. Right flank pain. Tachycardia. Pulmonary embolus suspected with high probability. Recent diagnosis of UTI without improvement. History of lung cancer metastatic to brain. EXAM: CT ANGIOGRAPHY CHEST CT ABDOMEN AND PELVIS WITH CONTRAST TECHNIQUE: Multidetector CT imaging of the chest was performed using the standard protocol during bolus administration of intravenous contrast. Multiplanar CT image reconstructions and MIPs were obtained to evaluate the vascular anatomy. Multidetector CT imaging of the abdomen and pelvis was performed using the standard protocol during bolus administration of intravenous contrast. CONTRAST:  172mL OMNIPAQUE IOHEXOL 350 MG/ML SOLN COMPARISON:  CT chest 12/12/2020.  CT abdomen and pelvis 02/11/2021 FINDINGS: CTA CHEST FINDINGS Cardiovascular: There is good opacification of the central and segmental pulmonary arteries. No focal filling defects. No evidence of significant pulmonary embolus. Ascending aortic aneurysm measuring 4 cm in diameter, unchanged. No aortic dissection. Great vessel origins are patent. Coronary artery and aortic calcification. Postoperative changes consistent with coronary bypass. Normal heart size. No pericardial effusions. Mediastinum/Nodes: Esophagus is decompressed. No significant lymphadenopathy in the chest. Scattered lymph nodes are not pathologically enlarged. Lungs/Pleura: Small right pleural effusion. Mild patchy areas of airspace disease in the right lung could represent early multifocal pneumonia. Left lung is clear. Previous right upper lung nodule has been resected in the interval. Musculoskeletal: Degenerative changes in the spine. Postoperative changes in the lower cervical spine. Sternotomy wires. Old  rib fractures. Review of the MIP images confirms the above findings. CT ABDOMEN and PELVIS FINDINGS Hepatobiliary: Subcentimeter lesion in the dome of the liver likely  representing a cavernous hemangioma. Gallbladder and bile ducts are unremarkable. Pancreas: Unremarkable. No pancreatic ductal dilatation or surrounding inflammatory changes. Spleen: Normal in size without focal abnormality. Adrenals/Urinary Tract: No adrenal gland nodules. Cyst in the upper pole left kidney. Nephrograms are symmetrical and otherwise homogeneous. No hydronephrosis or hydroureter. Bladder is unremarkable. Stomach/Bowel: Stomach, small bowel, and colon are not abnormally distended. No wall thickening or inflammatory changes are apparent. Scattered diverticula in the sigmoid colon without evidence of diverticulitis. Appendix is normal. Vascular/Lymphatic: Aortic atherosclerosis. No enlarged abdominal or pelvic lymph nodes. Reproductive: Prostate is unremarkable. Other: No free air or free fluid in the abdomen. Abdominal wall musculature appears intact. Musculoskeletal: Degenerative changes in the spine. No destructive bone lesions. Review of the MIP images confirms the above findings. IMPRESSION: 1. No evidence of significant pulmonary embolus. 2. Small right pleural effusion. Patchy infiltrates in the right lung are nonspecific but could indicate early multifocal pneumonia. 3. No acute process demonstrated in the abdomen or pelvis. 4. Aortic atherosclerosis. Electronically Signed   By: Lucienne Capers M.D.   On: 02/14/2021 22:09    Procedures Procedures   Medications Ordered in ED Medications  lactated ringers infusion (has no administration in time range)    ED Course  I have reviewed the triage vital signs and the nursing notes.  Pertinent labs & imaging results that were available during my care of the patient were reviewed by me and considered in my medical decision making (see chart for details).    MDM Rules/Calculators/A&P                          Spoke with Dr. Marin Olp who is on-call for oncology who recommends patient be admitted for IV antibiotics.  Will contact  hospitalist team Final Clinical Impression(s) / ED Diagnoses Final diagnoses:  None    Rx / DC Orders ED Discharge Orders     None        Lacretia Leigh, MD 02/16/21 1738

## 2021-02-16 NOTE — Progress Notes (Signed)
Pharmacy Antibiotic Note  [name] is a 65 y.o. male with a h/o BCC with a PAC admitted on 02/16/2021. Patient was sent from the cancer center with blood culture positive for staph speciesPharmacy has been consulted for vancomycin dosing for bacteremia possibly from [facility].   Plan: Vancomycin 2000 mg (1000 + 1000) followed by 1500 mg IV Q 24 hrs. Goal AUC 400-550. Expected AUC: 543  SCr used: 1.52  F/U renal function, culture results, and clinical course Levels if/when indicated     Temp (24hrs), Avg:98.4 F (36.9 C), Min:98.1 F (36.7 C), Max:98.6 F (37 C)  Recent Labs  Lab 02/10/21 2351 02/14/21 2040 02/14/21 2201 02/16/21 1425  WBC 12.4* 10.1  --  5.5  CREATININE 0.90 1.09  --  1.52*  LATICACIDVEN 1.9 1.5 1.1  --     Estimated Creatinine Clearance: 52.6 mL/min (A) (by C-G formula based on SCr of 1.52 mg/dL (H)).    Allergies  Allergen Reactions   Prednisone Other (See Comments)    States that this med makes him "crazy"   Tetanus Toxoids Swelling and Other (See Comments)    Fever, Swelling of the arm    Wellbutrin [Bupropion] Other (See Comments)    Crazy thoughts, nightmares   Indomethacin Other (See Comments)    rectal bleeding   Other     Other reaction(s): Unknown   Scallops [Shellfish Allergy] Nausea Only   Varenicline Other (See Comments)    Dreams Other reaction(s): Unknown    Antimicrobials this admission: 6/20 vancomycin >>  Dose adjustments this admission:  Microbiology results: 6/20 BCx >>  6/18 BCx >> 1/2 sets (2/4 bottles)   Thank you for allowing pharmacy to be a part of this patient's care.  [name] D 02/16/2021 7:25 PM

## 2021-02-16 NOTE — Telephone Encounter (Signed)
error 

## 2021-02-16 NOTE — ED Triage Notes (Addendum)
Pt, from [place], presents following a positive blood culture x 2 days ago.  [facility] RN reports Dr. [name] is concerned about the port-a-cath.  [name] would prefer all blood work be pulled from port.  Pt has no new complaints.  Only c/o R ribcage which is chronic.    Pt reports neither blood culture was drawn from [place] originally.

## 2021-02-17 ENCOUNTER — Encounter: Payer: Self-pay | Admitting: Internal Medicine

## 2021-02-17 ENCOUNTER — Other Ambulatory Visit (HOSPITAL_COMMUNITY): Payer: Medicare Other

## 2021-02-17 ENCOUNTER — Encounter (HOSPITAL_COMMUNITY): Payer: Self-pay | Admitting: Internal Medicine

## 2021-02-17 ENCOUNTER — Ambulatory Visit (HOSPITAL_COMMUNITY): Payer: Medicare Other

## 2021-02-17 DIAGNOSIS — R109 Unspecified abdominal pain: Secondary | ICD-10-CM

## 2021-02-17 DIAGNOSIS — E7800 Pure hypercholesterolemia, unspecified: Secondary | ICD-10-CM | POA: Diagnosis present

## 2021-02-17 DIAGNOSIS — R7881 Bacteremia: Secondary | ICD-10-CM | POA: Diagnosis present

## 2021-02-17 DIAGNOSIS — I110 Hypertensive heart disease with heart failure: Secondary | ICD-10-CM | POA: Diagnosis present

## 2021-02-17 DIAGNOSIS — I252 Old myocardial infarction: Secondary | ICD-10-CM | POA: Diagnosis not present

## 2021-02-17 DIAGNOSIS — F419 Anxiety disorder, unspecified: Secondary | ICD-10-CM | POA: Diagnosis present

## 2021-02-17 DIAGNOSIS — Z923 Personal history of irradiation: Secondary | ICD-10-CM | POA: Diagnosis not present

## 2021-02-17 DIAGNOSIS — M545 Low back pain, unspecified: Secondary | ICD-10-CM | POA: Diagnosis present

## 2021-02-17 DIAGNOSIS — I2510 Atherosclerotic heart disease of native coronary artery without angina pectoris: Secondary | ICD-10-CM | POA: Diagnosis present

## 2021-02-17 DIAGNOSIS — B958 Unspecified staphylococcus as the cause of diseases classified elsewhere: Secondary | ICD-10-CM | POA: Diagnosis present

## 2021-02-17 DIAGNOSIS — Z85828 Personal history of other malignant neoplasm of skin: Secondary | ICD-10-CM | POA: Diagnosis not present

## 2021-02-17 DIAGNOSIS — C7931 Secondary malignant neoplasm of brain: Secondary | ICD-10-CM | POA: Diagnosis present

## 2021-02-17 DIAGNOSIS — G8929 Other chronic pain: Secondary | ICD-10-CM | POA: Diagnosis not present

## 2021-02-17 DIAGNOSIS — F32A Depression, unspecified: Secondary | ICD-10-CM | POA: Diagnosis present

## 2021-02-17 DIAGNOSIS — I480 Paroxysmal atrial fibrillation: Secondary | ICD-10-CM | POA: Diagnosis present

## 2021-02-17 DIAGNOSIS — N179 Acute kidney failure, unspecified: Secondary | ICD-10-CM | POA: Diagnosis present

## 2021-02-17 DIAGNOSIS — I5042 Chronic combined systolic (congestive) and diastolic (congestive) heart failure: Secondary | ICD-10-CM | POA: Diagnosis present

## 2021-02-17 DIAGNOSIS — Z85118 Personal history of other malignant neoplasm of bronchus and lung: Secondary | ICD-10-CM | POA: Diagnosis not present

## 2021-02-17 DIAGNOSIS — C3491 Malignant neoplasm of unspecified part of right bronchus or lung: Secondary | ICD-10-CM | POA: Diagnosis present

## 2021-02-17 DIAGNOSIS — Z87442 Personal history of urinary calculi: Secondary | ICD-10-CM | POA: Diagnosis not present

## 2021-02-17 DIAGNOSIS — Z20822 Contact with and (suspected) exposure to covid-19: Secondary | ICD-10-CM | POA: Diagnosis present

## 2021-02-17 DIAGNOSIS — Z951 Presence of aortocoronary bypass graft: Secondary | ICD-10-CM | POA: Diagnosis not present

## 2021-02-17 DIAGNOSIS — Z87891 Personal history of nicotine dependence: Secondary | ICD-10-CM | POA: Diagnosis not present

## 2021-02-17 DIAGNOSIS — D84821 Immunodeficiency due to drugs: Secondary | ICD-10-CM | POA: Diagnosis present

## 2021-02-17 DIAGNOSIS — M5124 Other intervertebral disc displacement, thoracic region: Secondary | ICD-10-CM | POA: Diagnosis present

## 2021-02-17 DIAGNOSIS — K219 Gastro-esophageal reflux disease without esophagitis: Secondary | ICD-10-CM | POA: Diagnosis present

## 2021-02-17 DIAGNOSIS — Z8673 Personal history of transient ischemic attack (TIA), and cerebral infarction without residual deficits: Secondary | ICD-10-CM | POA: Diagnosis not present

## 2021-02-17 DIAGNOSIS — C7A8 Other malignant neuroendocrine tumors: Secondary | ICD-10-CM | POA: Diagnosis present

## 2021-02-17 LAB — COMPREHENSIVE METABOLIC PANEL
ALT: 22 U/L (ref 0–44)
AST: 19 U/L (ref 15–41)
Albumin: 3.6 g/dL (ref 3.5–5.0)
Alkaline Phosphatase: 65 U/L (ref 38–126)
Anion gap: 8 (ref 5–15)
BUN: 37 mg/dL — ABNORMAL HIGH (ref 8–23)
CO2: 27 mmol/L (ref 22–32)
Calcium: 9.4 mg/dL (ref 8.9–10.3)
Chloride: 102 mmol/L (ref 98–111)
Creatinine, Ser: 1.51 mg/dL — ABNORMAL HIGH (ref 0.61–1.24)
GFR, Estimated: 51 mL/min — ABNORMAL LOW (ref >60)
Glucose, Bld: 108 mg/dL — ABNORMAL HIGH (ref 70–99)
Potassium: 3.9 mmol/L (ref 3.5–5.1)
Sodium: 137 mmol/L (ref 135–145)
Total Bilirubin: 0.5 mg/dL (ref 0.3–1.2)
Total Protein: 6.5 g/dL (ref 6.5–8.1)

## 2021-02-17 LAB — CULTURE, BLOOD (ROUTINE X 2): Special Requests: ADEQUATE

## 2021-02-17 LAB — CBC
HCT: 32.7 % — ABNORMAL LOW (ref 39.0–52.0)
Hemoglobin: 10.8 g/dL — ABNORMAL LOW (ref 13.0–17.0)
MCH: 30.5 pg (ref 26.0–34.0)
MCHC: 33 g/dL (ref 30.0–36.0)
MCV: 92.4 fL (ref 80.0–100.0)
Platelets: 190 10*3/uL (ref 150–400)
RBC: 3.54 MIL/uL — ABNORMAL LOW (ref 4.22–5.81)
RDW: 11.9 % (ref 11.5–15.5)
WBC: 4.5 10*3/uL (ref 4.0–10.5)
nRBC: 0 % (ref 0.0–0.2)

## 2021-02-17 LAB — HIV ANTIBODY (ROUTINE TESTING W REFLEX): HIV Screen 4th Generation wRfx: NONREACTIVE

## 2021-02-17 MED ORDER — CHLORHEXIDINE GLUCONATE CLOTH 2 % EX PADS
6.0000 | MEDICATED_PAD | Freq: Every day | CUTANEOUS | Status: DC
  Administered 2021-02-17 – 2021-02-18 (×2): 6 via TOPICAL

## 2021-02-17 MED ORDER — CHLORDIAZEPOXIDE HCL 10 MG PO CAPS
20.0000 mg | ORAL_CAPSULE | Freq: Once | ORAL | Status: AC
  Administered 2021-02-17: 20 mg via ORAL
  Filled 2021-02-17: qty 2

## 2021-02-17 MED ORDER — SODIUM CHLORIDE 0.9 % IV SOLN: INTRAVENOUS | Status: DC

## 2021-02-17 MED ORDER — ENSURE ENLIVE PO LIQD
237.0000 mL | Freq: Two times a day (BID) | ORAL | Status: DC
  Administered 2021-02-18 (×2): 237 mL via ORAL

## 2021-02-17 MED ORDER — OXYCODONE HCL 5 MG PO TABS
5.0000 mg | ORAL_TABLET | Freq: Four times a day (QID) | ORAL | Status: DC | PRN
  Administered 2021-02-17 – 2021-02-18 (×2): 10 mg via ORAL
  Administered 2021-02-18 – 2021-02-19 (×3): 5 mg via ORAL
  Administered 2021-02-19: 10 mg via ORAL
  Filled 2021-02-17: qty 1
  Filled 2021-02-17: qty 2
  Filled 2021-02-17: qty 1
  Filled 2021-02-17 (×3): qty 2

## 2021-02-17 MED ORDER — MORPHINE SULFATE (PF) 2 MG/ML IV SOLN
2.0000 mg | INTRAVENOUS | Status: DC | PRN
  Administered 2021-02-17 – 2021-02-19 (×9): 2 mg via INTRAVENOUS
  Filled 2021-02-17 (×9): qty 1

## 2021-02-17 NOTE — Progress Notes (Signed)
Nutrition Follow-up  DOCUMENTATION CODES:  Not applicable  INTERVENTION:  -Ensure Enlive po BID, each supplement provides 350 kcal and 20 grams of protein -MVI with minerals daily -Will provide information regarding increasing kcal/protein intake  NUTRITION DIAGNOSIS:  Increased nutrient needs related to cancer and cancer related treatments as evidenced by estimated needs.  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 PMH includes CAD s/p CABG in 2017 and DES in 2019, ICM/CHF, A.fib, CVA, anxiety/depression.  AKI likely prerenal 2/2 poor po intake which pt reports started after his first chemo/radiation treatment on 6/17 -- pt states he was "wiped out" the next day and had a poor appetite. Reports symptoms have since improved. Will provide pt with oral nutrition supplement and information on increasing kcal/protein intake given increased nutrient needs 2/2 cancer and cancer-related treatments.   No significant weight changes noted per available weight history.  PO Intake: 100% x 1 recorded meal  UOP: 774ml x24 hours  Labs and medications reviewed.   Diet Order:   Diet Order             Diet Heart Room service appropriate? Yes; Fluid consistency: Thin  Diet effective now                  EDUCATION NEEDS:  Education needs have been addressed  Skin:  Skin Assessment: Reviewed RN Assessment  Last BM:  6/20  Height:  Ht Readings from Last 1 Encounters:  02/17/21 5\' 9"  (1.753 m)   Weight:  Wt Readings from Last 10 Encounters:  02/16/21 88.1 kg  02/16/21 86 kg  02/14/21 86.2 kg  02/10/21 88.5 kg  02/06/21 88.5 kg   01/29/21 87.5 kg  01/29/21 89.4 kg  01/29/21 89.4 kg  01/14/21 86.4 kg  01/12/21 86.6 kg   BMI:  Body mass index is 28.68 kg/m.  Estimated Nutritional Needs:  Kcal:  2400-2600 Protein:  120-130g Fluid:  >2L/d    Larkin Ina, MS, RD, LDN (she/her/hers) RD pager number and weekend/on-call pager number located in [place].

## 2021-02-17 NOTE — Discharge Instructions (Signed)

## 2021-02-17 NOTE — Progress Notes (Signed)
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  He presented to the ED 02/14/2021 for persistent flank pain since he had a lobectomy.  He had CT of the chest, abdomen and pelvis which showed patchy infiltrate in the right lung and small left pleural effusion.  Blood culture at that time was obtained and he was discharged on doxycycline in addition to his Keflex.  He went to the oncology office for follow-up visit yesterday and he directed to go to the ED due to abnormal blood cultures from 02/14/2021 which showed staph species 1 out of 2 bottles.  Patient reports poor p.o. intake and fatigue after his first radiation to the brain on 02/13/2021.  He states he is wiped out the next day and did not have a good appetite.  He states his symptoms are improved however his main complaint is significant amount of pain.  He did have some emesis which is nonbloody but happens once a day and he states that he also had flank pain which is unchanged from baseline.  He rated it at a 5 out of 10 but now states that is significantly worsened and states it radiates around to his back.  He is taking Tylenol and Percocet but however these were not abating his pain now.  He denies any urinary tract symptoms or chest pain or shortness of breath.  Reviewed blood cultures have been negative however they were taken from the  patient's port.  He was admitted for an abnormal blood culture as well as an AKI/azotemia.  Also has some nausea vomiting likely related to his chemo and cancer and had some right upper quadrant pain that radiated from his flank.  Assessment & Plan:   Active Problems:   Positive blood culture   Positive blood cultures  Abnormal blood culture -Patient had blood cultures obtained as a work-up for right flank pain when he presented to ED on 6/18 that shows staph species in 1 out of 2 bottles, but not staph aureus.   -He is immunocompromised due to chemotherapy but well-appearing clinically and non-toxic.   -He has no fever or leukocytosis to suggest true infection.   -I suspect this to be contaminant versus true infection.  However, he has been on doxycycline and Keflex which could mask. -Follow repeat blood culture (Drawn from [employer]) Will need Peripheral  -Continue IV vancomycin for now -Will need to discuss with ID consult in the morning about further direction to see if formal Consult is needed -Follow Cx. He is Afebrile and has no leukocytosis    AKI/Azotemia:  -Likely prerenal from poor p.o. intake after radiation and chemotherapy.   -Not on nephrotoxic meds. -IV fluid at 75 cc an hour over the next Day. -Patient's BUN/Cr went from 41/1.52 -> 37/1.51 -Avoid further nephrotoxic medications, contrast dyes, hypotension and renally dose medications -Repeat CMP in a.m.   Stage IV large cell neuroendocrine  carcinoma of the right lung with brain mets status post lobectomy currently on chemo and radiation to brain -Medical oncology has been added to the treatment team   Nausea and vomiting Right flank pain -Likely related to chemo and cancer.  He has RUQ tenderness that radiates to his flank but negative Murphy.  -LFT negative.  No leukocytosis or fever either.  Recent CT reassuring. -IV fluid and antiemetics. -Continue with analgesics and have added IV morphine now   History of CAD  s/p CABG in 2017 and DES in 2019-stable. -Continue home medications including ASA, Statin, Clopidogrel, Metoprolol, and Ranolazine   History of ICM/systolic CHF with recovered EF.  -Last TTE with LVEF of 55 to 60% and G1 DD. -Decrease IV fluid to 75 cc an hour and monitor  -Closely monitor fluid and respiratory status   Paroxysmal A. Fib -Rate controlled.  CHA2DS2-VASc score at least 3.  Not on anticoagulation -Continue home amiodarone 200 mg po Daily and metoprolol   CVA without residual deficits -Stable. -Continue Atorvastatin 80 mg po Daily , Clopidogrel and aspirin 81 -Obtain PT/OT    Anxiety and depression: -Stable. -Continue home medications   Right flank pain:  -Likely a sequela of his thoracic surgery during lobectomy.  No UTI symptoms.  No CVA tenderness. -As needed Tylenol and oxycodone; Will add Morphine 2 mg IV q4hprn given Severe Pain    COPD?  Stable. -Inhalers with Albuterol and Anoro Ellipta   DVT prophylaxis: Enoxaparin 40 mg sq q24h Code Status: FULL CODE  Family Communication: No family present at bedside  Disposition Plan: Pending improvement in Pain, evaluation by PT/OT and Discussion with ID about Positive Blood Cx  Status is: Inpatient  Remains inpatient appropriate because:Unsafe d/c plan, IV treatments appropriate due to intensity of illness or inability to take PO, and Inpatient level of care appropriate due to severity of illness  Dispo: The patient is from: Home              Anticipated d/c is to: Home              Patient currently is not medically stable to d/c.   Difficult to place patient No  Consultants:  Medical Oncology added to the Treatment Team  Procedures: None  Antimicrobials:  Anti-infectives (From admission, onward)    Start     Dose/Rate Route Frequency Ordered Stop   02/17/21 0800  vancomycin (VANCOREADY) IVPB 1500 mg/300 mL        1,500 mg 150 mL/hr over 120 Minutes Intravenous Every 24 hours 02/16/21 1925     02/16/21  1930  vancomycin (VANCOCIN) IVPB 1000 mg/200 mL premix        1,000 mg 200 mL/hr over 60 Minutes Intravenous  Once 02/16/21 1925 02/16/21 2044   02/16/21 1745  vancomycin (VANCOCIN) IVPB 1000 mg/200 mL premix        1,000 mg 200 mL/hr over 60 Minutes Intravenous  Once 02/16/21 1738 02/16/21 1945        Subjective: Seen and examined at bedside and he had no respiratory complaints or burning or discomfort in his urine.  His main complaint was right flank pain that he has been having he states is very severe now.  No nausea or vomiting.  Denies any lightheadedness or dizziness but reports this time.  Objective: Vitals:   02/17/21 0558 02/17/21 0752 02/17/21 0952 02/17/21 1355  BP: 121/89  119/76 116/68  Pulse: 79  75 76  Resp: 14  16 17  Temp: 97.7 F (36.5 C)  97.8 F (36.6 C) 97.9 F (36.6 C)  TempSrc: Oral  Oral Oral  SpO2: 100%  99% 99%  Weight:      Height:  5\' 9"  (1.753 m)      Intake/Output Summary (Last 24 hours) at 02/17/2021 2048 Last data filed at 02/17/2021 1400 Gross per 24 hour  Intake 1947.23 ml  Output 775 ml  Net 1172.23 ml   Filed Weights   02/16/21 2151  Weight: 88.1 kg   Examination: Physical Exam:  Constitutional: WN/WD overweight Caucasian male in NAD and appears slightly anxious and uncomfortable Eyes: Lids and conjunctivae normal, sclerae anicteric  ENMT: External Ears, Nose appear normal. Grossly normal hearing.  Neck: Appears normal, supple, no cervical masses, normal ROM, no appreciable thyromegaly; no JVD Respiratory: Diminished to auscultation bilaterally, no wheezing, rales, rhonchi or crackles. Normal respiratory effort and patient is not tachypenic. No accessory muscle use. Unlabored Breathing  Cardiovascular: RRR, no murmurs / rubs / gallops. S1 and S2 auscultated. Abdomen: Soft, non-tender, Distended 2/2 to body habitus. Bowel sounds positive.  GU: Deferred. Musculoskeletal: No clubbing / cyanosis of digits/nails. No joint deformity  upper and lower extremities.  Skin: No rashes, lesions, ulcers on a limited skin evaluation. No induration; Warm and dry.  Neurologic: CN 2-12 grossly intact with no focal deficits. Romberg sign and cerebellar reflexes not assessed.  Psychiatric: Normal judgment and insight. Alert and oriented x 3. Anxious mood and appropriate affect.   Data Reviewed: I have personally reviewed following labs and imaging studies  CBC: Recent Labs  Lab 02/10/21 2351 02/14/21 2040 02/16/21 1425 02/17/21 0419  WBC 12.4* 10.1 5.5 4.5  NEUTROABS 11.3*  --  3.3  --   HGB 12.3* 13.7 11.2* 10.8*  HCT 37.8* 40.9 33.8* 32.7*  MCV 92.0 89.5 89.2 92.4  PLT 262 283 221 376   Basic Metabolic Panel: Recent Labs  Lab 02/10/21 2351 02/14/21 2040 02/16/21 1425 02/17/21 0419  NA 135 135 135 137  K 4.3 3.5 4.0 3.9  CL 104 99 100 102  CO2 22 27 27 27   GLUCOSE 136* 110* 102* 108*  BUN 21 27* 41* 37*  CREATININE 0.90 1.09 1.52* 1.51*  CALCIUM 9.0 9.4 9.3 9.4  MG  --   --  1.9  --    GFR: Estimated Creatinine Clearance: 53.6 mL/min (A) (by C-G formula based on SCr of 1.51 mg/dL (H)). Liver Function Tests: Recent Labs  Lab 02/10/21 2351 02/14/21 2140 02/16/21 1425 02/17/21 0419  AST 20 20 20 19   ALT 19 24 25 22   ALKPHOS 73 64 62 65  BILITOT 0.6 0.9 0.9 0.5  PROT 7.3 6.9 6.8 6.5  ALBUMIN 3.9 3.9 3.8 3.6   Recent Labs  Lab 02/10/21 2351 02/14/21 2140  LIPASE 20 21   No results for input(s): AMMONIA in the last 168 hours. Coagulation Profile: No results for input(s): INR, PROTIME in the last 168 hours. Cardiac Enzymes: No results for input(s): CKTOTAL, CKMB, CKMBINDEX, TROPONINI in the last 168 hours. BNP (last 3 results) No results for input(s): PROBNP in the last 8760 hours. HbA1C: No results for input(s): HGBA1C in the last 72 hours. CBG: No results for input(s): GLUCAP in the last 168 hours. Lipid Profile: No results for input(s): CHOL, HDL, LDLCALC, TRIG, CHOLHDL, LDLDIRECT in the  last 72 hours. Thyroid Function Tests: No results for input(s): TSH, T4TOTAL, FREET4, T3FREE, THYROIDAB in the last 72 hours. Anemia Panel: No results for input(s): VITAMINB12, FOLATE,  FERRITIN, TIBC, IRON, RETICCTPCT in the last 72 hours. Sepsis Labs: Recent Labs  Lab 02/10/21 2351 02/14/21 2040 02/14/21 2201 02/16/21 1733  LATICACIDVEN 1.9 1.5 1.1 1.1    Recent Results (from the past 240 hour(s))  Urine culture     Status: None   Collection Time: 02/10/21 11:51 PM   Specimen: Urine, Random  Result Value Ref Range Status   Specimen Description   Final    URINE, RANDOM Performed at [facility], [address] [place], [place] [address]    Special Requests   Final    NONE Performed at [facility], [address] [place], [place] [address]    Culture   Final    NO GROWTH Performed at [facility], [place] [address] [place] [address], [place], [place] [address]    Report Status 02/12/2021 FINAL  Final  Resp Panel by RT-PCR (Flu A&B, Covid) Nasopharyngeal Swab     Status: None   Collection Time: 02/14/21  9:40 PM   Specimen: Nasopharyngeal Swab; Nasopharyngeal(NP) swabs in vial transport medium  Result Value Ref Range Status   SARS Coronavirus 2 by RT PCR NEGATIVE NEGATIVE Final    Comment: (NOTE) SARS-CoV-2 target nucleic acids are NOT DETECTED.  The SARS-CoV-2 RNA is generally detectable in upper respiratory specimens during the acute phase of infection. The lowest concentration of SARS-CoV-2 viral copies this assay can detect is 138 copies/mL. A negative result does not preclude SARS-Cov-2 infection and should not be used as the sole basis for treatment or other patient management decisions. A negative result may occur with  improper specimen collection/handling, submission of specimen other than nasopharyngeal swab, presence of viral mutation(s) within the areas targeted by this assay, and inadequate number of viral copies(<138 copies/mL). A negative  result must be combined with clinical observations, patient history, and epidemiological information. The expected result is Negative.  Fact Sheet for Patients:  EntrepreneurPulse.com.au  Fact Sheet for Healthcare Providers:  IncredibleEmployment.be  This test is no t yet approved or cleared by the [place] FDA and  has been authorized for detection and/or diagnosis of SARS-CoV-2 by FDA under an Emergency Use Authorization (EUA). This EUA will remain  in effect (meaning this test can be used) for the duration of the COVID-19 declaration under Section 564(b)(1) of the Act, 21 U.S.C.section 360bbb-3(b)(1), unless the authorization is terminated  or revoked sooner.       Influenza A by PCR NEGATIVE NEGATIVE Final   Influenza B by PCR NEGATIVE NEGATIVE Final    Comment: (NOTE) The Xpert Xpress SARS-CoV-2/FLU/RSV plus assay is intended as an aid in the diagnosis of influenza from Nasopharyngeal swab specimens and should not be used as a sole basis for treatment. Nasal washings and aspirates are unacceptable for Xpert Xpress SARS-CoV-2/FLU/RSV testing.  Fact Sheet for Patients: EntrepreneurPulse.com.au  Fact Sheet for Healthcare Providers: IncredibleEmployment.be  This test is not yet approved or cleared by the [place] FDA and has been authorized for detection and/or diagnosis of SARS-CoV-2 by FDA under an Emergency Use Authorization (EUA). This EUA will remain in effect (meaning this test can be used) for the duration of the COVID-19 declaration under Section 564(b)(1) of the Act, 21 U.S.C. section 360bbb-3(b)(1), unless the authorization is terminated or revoked.  Performed at [facility], [address] [place], [place] [address]   Blood culture (routine x 2)     Status: Abnormal   Collection Time: 02/14/21 10:00 PM  Specimen: BLOOD  Result Value Ref Range Status   Specimen  Description   Final    BLOOD LEFT ANTECUBITAL Performed at [facility], [address] [place], [place] [address]    Special Requests   Final    BOTTLES DRAWN AEROBIC AND ANAEROBIC Blood Culture adequate volume Performed at [facility], [address] [place], [place] [address]    Culture  Setup Time   Final    GRAM POSITIVE COCCI IN CLUSTERS IN BOTH AEROBIC AND ANAEROBIC BOTTLES CRITICAL RESULT CALLED TO, READ BACK BY AND VERIFIED WITH: RN SUZANNA LOUDERMILK BY MESSAN H. AT [place] ON 6 20 2022    Culture (A)  Final    STAPHYLOCOCCUS CAPITIS THE SIGNIFICANCE OF ISOLATING THIS ORGANISM FROM A SINGLE SET OF BLOOD CULTURES WHEN MULTIPLE SETS ARE DRAWN IS UNCERTAIN. PLEASE NOTIFY THE MICROBIOLOGY DEPARTMENT WITHIN ONE WEEK IF SPECIATION AND SENSITIVITIES ARE REQUIRED. Performed at [facility], [place] [address] [place], [place] [address]    Report Status 02/17/2021 FINAL  Final  Blood Culture ID Panel (Reflexed)     Status: Abnormal   Collection Time: 02/14/21 10:00 PM  Result Value Ref Range Status   Enterococcus faecalis NOT DETECTED NOT DETECTED Final   Enterococcus Faecium NOT DETECTED NOT DETECTED Final   Listeria monocytogenes NOT DETECTED NOT DETECTED Final   Staphylococcus species DETECTED (A) NOT DETECTED Final    Comment: CRITICAL RESULT CALLED TO, READ BACK BY AND VERIFIED WITH: RN SUZANNA LOUDERMILK BY MESSAN H. AT [id number] ON 6 20 2022    Staphylococcus aureus (BCID) NOT DETECTED NOT DETECTED Final   Staphylococcus epidermidis NOT DETECTED NOT DETECTED Final   Staphylococcus lugdunensis NOT DETECTED NOT DETECTED Final   Streptococcus species NOT DETECTED NOT DETECTED Final   Streptococcus agalactiae NOT DETECTED NOT DETECTED Final   Streptococcus pneumoniae NOT DETECTED NOT DETECTED Final   Streptococcus pyogenes NOT DETECTED NOT DETECTED Final   A.calcoaceticus-baumannii NOT DETECTED NOT DETECTED Final   Bacteroides fragilis NOT DETECTED NOT  DETECTED Final   Enterobacterales NOT DETECTED NOT DETECTED Final   Enterobacter cloacae complex NOT DETECTED NOT DETECTED Final   Escherichia coli NOT DETECTED NOT DETECTED Final   Klebsiella aerogenes NOT DETECTED NOT DETECTED Final   Klebsiella oxytoca NOT DETECTED NOT DETECTED Final   Klebsiella pneumoniae NOT DETECTED NOT DETECTED Final   Proteus species NOT DETECTED NOT DETECTED Final   Salmonella species NOT DETECTED NOT DETECTED Final   Serratia marcescens NOT DETECTED NOT DETECTED Final   Haemophilus influenzae NOT DETECTED NOT DETECTED Final   Neisseria meningitidis NOT DETECTED NOT DETECTED Final   Pseudomonas aeruginosa NOT DETECTED NOT DETECTED Final   Stenotrophomonas maltophilia NOT DETECTED NOT DETECTED Final   Candida albicans NOT DETECTED NOT DETECTED Final   Candida auris NOT DETECTED NOT DETECTED Final   Candida glabrata NOT DETECTED NOT DETECTED Final   Candida krusei NOT DETECTED NOT DETECTED Final   Candida parapsilosis NOT DETECTED NOT DETECTED Final   Candida tropicalis NOT DETECTED NOT DETECTED Final   Cryptococcus neoformans/gattii NOT DETECTED NOT DETECTED Final    Comment: Performed at [facility], [place] [address] [place], [place] [address]  Blood culture (routine x 2)     Status: None (Preliminary result)   Collection Time: 02/14/21 10:10 PM   Specimen: BLOOD  Result Value Ref Range Status   Specimen Description   Final    BLOOD RIGHT ANTECUBITAL Performed at [facility]  [address], [place] [place], [place] [address]    Special Requests   Final    BOTTLES DRAWN AEROBIC AND ANAEROBIC Blood Culture adequate volume Performed at [facility], [address] [place], [place] [address]    Culture   Final    NO GROWTH 2 DAYS Performed at [facility], [place] [address] [place], [place] [address]    Report Status PENDING  Incomplete  Culture, blood (Routine X 2) w Reflex to ID Panel     Status: None (Preliminary result)    Collection Time: 02/16/21  5:36 PM   Specimen: Chest; Blood  Result Value Ref Range Status   Specimen Description   Final    CHEST RIGHT Performed at [place] [address], [place], [place] [address]    Special Requests   Final    BOTTLES DRAWN AEROBIC AND ANAEROBIC Blood Culture adequate volume Performed at [place] [address], [place], [place] [address]    Culture   Final    NO GROWTH < 12 HOURS Performed at [place] [address], [place], [place] [address]    Report Status PENDING  Incomplete  Culture, blood (Routine X 2) w Reflex to ID Panel     Status: None (Preliminary result)   Collection Time: 02/16/21  5:36 PM   Specimen: Chest; Blood  Result Value Ref Range Status   Specimen Description   Final    CHEST RIGHT Performed at [place] [address] [place], [place] [address]    Special Requests   Final    BOTTLES DRAWN AEROBIC AND ANAEROBIC Blood Culture adequate volume Performed at [place] [address] [place], [place] [address]    Culture   Final    NO GROWTH < 12 HOURS Performed at [place] [address] [place], [place] [address]    Report Status PENDING  Incomplete  Resp Panel by RT-PCR (Flu A&B, Covid) Nasopharyngeal Swab     Status: None   Collection Time: 02/16/21  5:59 PM   Specimen: Nasopharyngeal Swab; Nasopharyngeal(NP) swabs in vial transport medium  Result Value Ref Range Status   SARS Coronavirus 2 by RT PCR NEGATIVE NEGATIVE Final    Comment: (NOTE) SARS-CoV-2 target nucleic acids are NOT DETECTED.  The SARS-CoV-2 RNA is generally detectable in upper respiratory specimens during the acute phase of infection. The lowest concentration of SARS-CoV-2 viral copies this assay can detect is 138 copies/mL. A negative result does not preclude SARS-Cov-2 infection and should not be used as the sole basis for treatment or other  patient management decisions. A negative result may occur with  improper specimen collection/handling, submission of specimen other than nasopharyngeal swab, presence of viral mutation(s) within the areas targeted by this assay, and inadequate number of viral copies(<138 copies/mL). A negative result must be combined with clinical observations, patient history, and epidemiological information. The expected result is Negative.  Fact Sheet for Patients:  EntrepreneurPulse.com.au  Fact Sheet for Healthcare Providers:  IncredibleEmployment.be  This test is no t yet approved or cleared by the [place] FDA and  has been authorized for detection and/or diagnosis of SARS-CoV-2 by FDA under an Emergency Use Authorization (EUA). This EUA will remain  in effect (meaning this test can be used) for the duration of the COVID-19 declaration under Section 564(b)(1) of the Act, 21 U.S.C.section 360bbb-3(b)(1), unless the authorization is terminated  or revoked sooner.       Influenza A by PCR NEGATIVE NEGATIVE Final   Influenza B by PCR NEGATIVE NEGATIVE Final    Comment: (NOTE) The Xpert Xpress SARS-CoV-2/FLU/RSV plus assay is intended as an aid in the diagnosis of influenza from Nasopharyngeal swab specimens and should not be used as a sole basis for treatment. Nasal washings and aspirates are unacceptable for Xpert Xpress SARS-CoV-2/FLU/RSV testing.  Fact Sheet for Patients: EntrepreneurPulse.com.au  Fact Sheet for Healthcare Providers: IncredibleEmployment.be  This test is not yet approved or cleared by the [place] FDA and has been authorized for detection and/or diagnosis of SARS-CoV-2 by FDA under an Emergency Use Authorization (EUA). This EUA will remain in effect (meaning this test can be used) for the duration of the COVID-19 declaration under Section 564(b)(1) of the Act, 21 U.S.C. section  360bbb-3(b)(1), unless the authorization is terminated or revoked.  Performed at [facility], [place] [address] [place], [place] [address]     RN Pressure Injury Documentation:     Estimated body mass index is 28.68 kg/m as calculated from the following:   Height as of this encounter: 5\' 9"  (1.753 m).   Weight as of this encounter: 88.1 kg.  Malnutrition Type:  Nutrition Problem: Increased nutrient needs Etiology: cancer and cancer related treatments  Malnutrition Characteristics:  Signs/Symptoms: estimated needs  Nutrition Interventions:  Interventions: Ensure Enlive (each supplement provides 350kcal and 20 grams of protein), MVI, Education   Radiology Studies: No results found.  Scheduled Meds:  amiodarone  200 mg Oral Daily   aspirin EC  81 mg Oral Daily   atorvastatin  80 mg Oral Daily   Chlorhexidine Gluconate Cloth  6 each Topical Daily   clopidogrel  75 mg Oral Daily   enoxaparin (LOVENOX) injection  40 mg Subcutaneous Q24H   [START ON 02/18/2021] feeding supplement  237 mL Oral BID BM   metoprolol succinate  25 mg Oral Daily   multivitamin with minerals  1 tablet Oral Daily   ranolazine  1,000 mg Oral BID   umeclidinium-vilanterol  1 puff Inhalation Daily   Continuous Infusions:  lactated ringers 75 mL/hr at 02/17/21 0400   vancomycin 1,500 mg (02/17/21 0726)    LOS: 0 days   Kerney Elbe, DO [facility] PAGER is on AMION  If 7PM-7AM, please contact night-coverage [email or web address]

## 2021-02-18 ENCOUNTER — Inpatient Hospital Stay (HOSPITAL_COMMUNITY): Payer: Medicare Other

## 2021-02-18 ENCOUNTER — Telehealth: Payer: Self-pay | Admitting: Emergency Medicine

## 2021-02-18 DIAGNOSIS — G8929 Other chronic pain: Secondary | ICD-10-CM

## 2021-02-18 DIAGNOSIS — M545 Low back pain, unspecified: Secondary | ICD-10-CM

## 2021-02-18 LAB — MAGNESIUM: Magnesium: 1.9 mg/dL (ref 1.7–2.4)

## 2021-02-18 LAB — CBC WITH DIFFERENTIAL/PLATELET
Abs Immature Granulocytes: 0.02 10*3/uL (ref 0.00–0.07)
Basophils Absolute: 0 10*3/uL (ref 0.0–0.1)
Basophils Relative: 1 %
Eosinophils Absolute: 0.1 10*3/uL (ref 0.0–0.5)
Eosinophils Relative: 2 %
HCT: 33 % — ABNORMAL LOW (ref 39.0–52.0)
Hemoglobin: 10.7 g/dL — ABNORMAL LOW (ref 13.0–17.0)
Immature Granulocytes: 1 %
Lymphocytes Relative: 51 %
Lymphs Abs: 1.3 10*3/uL (ref 0.7–4.0)
MCH: 29.9 pg (ref 26.0–34.0)
MCHC: 32.4 g/dL (ref 30.0–36.0)
MCV: 92.2 fL (ref 80.0–100.0)
Monocytes Absolute: 0.1 10*3/uL (ref 0.1–1.0)
Monocytes Relative: 4 %
Neutro Abs: 1.1 10*3/uL — ABNORMAL LOW (ref 1.7–7.7)
Neutrophils Relative %: 41 %
Platelets: 186 10*3/uL (ref 150–400)
RBC: 3.58 MIL/uL — ABNORMAL LOW (ref 4.22–5.81)
RDW: 11.9 % (ref 11.5–15.5)
WBC Morphology: >10
WBC: 2.6 10*3/uL — ABNORMAL LOW (ref 4.0–10.5)
nRBC: 0 % (ref 0.0–0.2)

## 2021-02-18 LAB — COMPREHENSIVE METABOLIC PANEL
ALT: 19 U/L (ref 0–44)
AST: 17 U/L (ref 15–41)
Albumin: 3.7 g/dL (ref 3.5–5.0)
Alkaline Phosphatase: 70 U/L (ref 38–126)
Anion gap: 6 (ref 5–15)
BUN: 25 mg/dL — ABNORMAL HIGH (ref 8–23)
CO2: 28 mmol/L (ref 22–32)
Calcium: 9.2 mg/dL (ref 8.9–10.3)
Chloride: 102 mmol/L (ref 98–111)
Creatinine, Ser: 1.69 mg/dL — ABNORMAL HIGH (ref 0.61–1.24)
GFR, Estimated: 44 mL/min — ABNORMAL LOW (ref >60)
Glucose, Bld: 105 mg/dL — ABNORMAL HIGH (ref 70–99)
Potassium: 4.3 mmol/L (ref 3.5–5.1)
Sodium: 136 mmol/L (ref 135–145)
Total Bilirubin: 0.6 mg/dL (ref 0.3–1.2)
Total Protein: 6.3 g/dL — ABNORMAL LOW (ref 6.5–8.1)

## 2021-02-18 LAB — PHOSPHORUS: Phosphorus: 3.1 mg/dL (ref 2.5–4.6)

## 2021-02-18 MED ORDER — ESCITALOPRAM OXALATE 10 MG PO TABS
10.0000 mg | ORAL_TABLET | Freq: Every day | ORAL | Status: DC
  Filled 2021-02-18 (×2): qty 1

## 2021-02-18 NOTE — Progress Notes (Signed)
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  He presented to ED on 02/14/2021 for persistent right-sided flank pain since he had lobectomy.  CT chest abdomen/pelvis showed patchy infiltrates in right lung with small left pleural effusion.  Blood cultures obtained at that time and patient was discharged doxycycline. Blood cultures 1 out of 2 bottles was growing staph S so he was sent to ED for further evaluation.    Subjective   Patient seen and examined, still continues to have right-sided lower lumbar pain.   Assessment/Plan:     Low back pain -Patient has a longstanding history of working in [employer] -Does have history of low back pain -MRI of thoracic spine from 2020 showed paramedian disc protrusion at T8-T9 -We will obtain thoracic and lumbar spine MRI today  Positive blood cultures -1 set of blood cultures growing staph S, rest of blood cultures are negative to date. -Discussed with ID, Dr. West Bali; she recommends no treatment, likely contamination -We will discontinue IV antibiotics  Acute kidney injury -Likely from poor p.o. intake -Started on IV normal 100 ml/h -Follow BUN/creatinine in a.m.  History of CAD s/p CABG in 2017 and DES in 2019 -Stable -Continue aspirin, Plavix, metoprolol, ranolazine  Paroxysmal atrial fibrillation -Rate controlled -Continue amiodarone, metoprolol - CHA2DS2VASc score is 3 -Not on anticoagulation  CVA without residual deficits -Stable -Continue Plavix, aspirin   History of  ischemic cardiomyopathy/systolic CHF -Last TTE showed EF of 55 to 60% with grade 1 diastolic dysfunction -We will closely monitor patient intake and output    Scheduled medications:    amiodarone  200 mg Oral Daily   aspirin EC  81 mg Oral Daily   atorvastatin  80 mg Oral Daily   Chlorhexidine Gluconate Cloth  6 each Topical Daily   clopidogrel  75 mg Oral Daily   enoxaparin (LOVENOX) injection  40 mg Subcutaneous Q24H   escitalopram  10 mg Oral Daily   feeding supplement  237 mL Oral BID BM   metoprolol succinate  25 mg Oral Daily   multivitamin with minerals  1 tablet Oral Daily   ranolazine  1,000 mg Oral BID   umeclidinium-vilanterol  1 puff Inhalation Daily         Data Reviewed:   CBG:  No results for input(s): GLUCAP in the last 168 hours.  SpO2: 99 %    Vitals:   02/17/21 1355 02/17/21 2146 02/18/21 0519 02/18/21 1437  BP: 116/68 130/78 111/64 112/71  Pulse: 76 77 96 74  Resp: 17 16 16 16   Temp: 97.9 F (36.6 C) 97.8 F (36.6 C) (!) 97.5 F (36.4 C) 98 F (36.7 C)  TempSrc: Oral Oral Oral Oral  SpO2: 99% 99% 96% 99%  Weight:      Height:         Intake/Output Summary (Last 24 hours) at 02/18/2021 1822 Last data filed at 02/18/2021 1737 Gross per 24 hour  Intake 470.7 ml  Output 575 ml  Net -104.3 ml    06/20 1901 - 06/22 0700 In: 2617.9 [P.O.:360;  I.V.:1757.9] Out: 775 [Urine:775]  Filed Weights   02/16/21 2151  Weight: 88.1 kg    CBC:  Recent Labs  Lab 02/14/21 2040 02/16/21 1425 02/17/21 0419 02/18/21 1055  WBC 10.1 5.5 4.5 2.6*  HGB 13.7 11.2* 10.8* 10.7*  HCT 40.9 33.8* 32.7* 33.0*  PLT 283 221 190 186  MCV 89.5 89.2 92.4 92.2  MCH 30.0 29.6 30.5 29.9  MCHC 33.5 33.1 33.0 32.4  RDW 12.1 11.9 11.9 11.9  LYMPHSABS  --  1.8  --  1.3  MONOABS  --  0.1  --  0.1  EOSABS  --  0.1  --  0.1  BASOSABS  --  0.0  --  0.0    Complete metabolic panel:  Recent Labs  Lab 02/14/21 2040 02/14/21 2140 02/14/21 2201 02/16/21 1425  02/16/21 1733 02/17/21 0419 02/18/21 1055  NA 135  --   --  135  --  137 136  K 3.5  --   --  4.0  --  3.9 4.3  CL 99  --   --  100  --  102 102  CO2 27  --   --  27  --  27 28  GLUCOSE 110*  --   --  102*  --  108* 105*  BUN 27*  --   --  41*  --  37* 25*  CREATININE 1.09  --   --  1.52*  --  1.51* 1.69*  CALCIUM 9.4  --   --  9.3  --  9.4 9.2  AST  --  20  --  20  --  19 17  ALT  --  24  --  25  --  22 19  ALKPHOS  --  64  --  62  --  65 70  BILITOT  --  0.9  --  0.9  --  0.5 0.6  ALBUMIN  --  3.9  --  3.8  --  3.6 3.7  MG  --   --   --  1.9  --   --  1.9  LATICACIDVEN 1.5  --  1.1  --  1.1  --   --     Recent Labs  Lab 02/14/21 2140  LIPASE 21    Recent Labs  Lab 02/14/21 2140 02/16/21 1759  SARSCOV2NAA NEGATIVE NEGATIVE    ------------------------------------------------------------------------------------------------------------------ No results for input(s): CHOL, HDL, LDLCALC, TRIG, CHOLHDL, LDLDIRECT in the last 72 hours.  Lab Results  Component Value Date   HGBA1C 5.0 11/10/2016   ------------------------------------------------------------------------------------------------------------------ No results for input(s): TSH, T4TOTAL, T3FREE, THYROIDAB in the last 72 hours.  Invalid input(s): FREET3 ------------------------------------------------------------------------------------------------------------------ No results for input(s): VITAMINB12, FOLATE, FERRITIN, TIBC, IRON, RETICCTPCT in the last 72 hours.  Coagulation profile No results for input(s): INR, PROTIME in the last 168 hours. No results for input(s): DDIMER in the last 72 hours.  Cardiac Enzymes No results for input(s): CKTOTAL, CKMB, CKMBINDEX, TROPONINI in the last 168 hours.  ------------------------------------------------------------------------------------------------------------------    Component Value Date/Time   BNP 26.5 11/07/2019 2307     Antibiotics: Anti-infectives (From  admission, onward)    Start     Dose/Rate Route Frequency Ordered Stop   02/17/21 0800  vancomycin (VANCOREADY) IVPB 1500 mg/300 mL        1,500 mg 150 mL/hr over 120 Minutes Intravenous Every 24 hours 02/16/21 1925     02/16/21 1930  vancomycin (VANCOCIN) IVPB 1000 mg/200 mL premix  1,000 mg 200 mL/hr over 60 Minutes Intravenous  Once 02/16/21 1925 02/16/21 2044   02/16/21 1745  vancomycin (VANCOCIN) IVPB 1000 mg/200 mL premix        1,000 mg 200 mL/hr over 60 Minutes Intravenous  Once 02/16/21 1738 02/16/21 1945        Radiology Reports  No results found.    DVT prophylaxis: Lovenox  Code Status: Full code  Family Communication: No family at bedside   Consultants:   Procedures:     Objective    Physical Examination:   General-appears in no acute distress Heart-S1-S2, regular, no murmur auscultated Lungs-clear to auscultation bilaterally, no wheezing or crackles auscultated Abdomen-soft, nontender, no organomegaly.  No CVA tenderness noted patient has tenderness in right lower lumbar region, positive tenderness in paraspinal muscles. Extremities-no edema in the lower extremities Neuro-alert, oriented x3, no focal deficit noted   Status is: Inpatient  Dispo: The patient is from: Home              Anticipated d/c is to: Home              Anticipated d/c date is: 02/21/2021              Patient currently not stable for discharge  Barrier to discharge-ongoing right lower lumbar region pain  COVID-19 Labs  No results for input(s): DDIMER, FERRITIN, LDH, CRP in the last 72 hours.  Lab Results  Component Value Date   [place] NEGATIVE 02/16/2021   [place] NEGATIVE 02/14/2021   [place] NEGATIVE 12/22/2020   [place] NEGATIVE 04/04/2020    Microbiology  Recent Results (from the past 240 hour(s))  Urine culture     Status: None   Collection Time: 02/10/21 11:51 PM   Specimen: Urine, Random  Result Value Ref Range Status    Specimen Description   Final    URINE, RANDOM Performed at [facility], [address] [place], [place] [address]    Special Requests   Final    NONE Performed at [facility], [address] [place], [place] [address]    Culture   Final    NO GROWTH Performed at [facility], [place] [address] [place], [place] [address]    Report Status 02/12/2021 FINAL  Final  Resp Panel by RT-PCR (Flu A&B, Covid) Nasopharyngeal Swab     Status: None   Collection Time: 02/14/21  9:40 PM   Specimen: Nasopharyngeal Swab; Nasopharyngeal(NP) swabs in vial transport medium  Result Value Ref Range Status   SARS Coronavirus 2 by RT PCR NEGATIVE NEGATIVE Final    Comment: (NOTE) SARS-CoV-2 target nucleic acids are NOT DETECTED.  The SARS-CoV-2 RNA is generally detectable in upper respiratory specimens during the acute phase of infection. The lowest concentration of SARS-CoV-2 viral copies this assay can detect is 138 copies/mL. A negative result does not preclude SARS-Cov-2 infection and should not be used as the sole basis for treatment or other patient management decisions. A negative result may occur with  improper specimen collection/handling, submission of specimen other than nasopharyngeal swab, presence of viral mutation(s) within the areas targeted by this assay, and inadequate number of viral copies(<138 copies/mL). A negative result must be combined with clinical observations, patient history, and epidemiological information. The expected result is Negative.  Fact Sheet for Patients:  EntrepreneurPulse.com.au  Fact Sheet for Healthcare Providers:  IncredibleEmployment.be  This test is no t yet approved or cleared by the [place] and  has been authorized for detection and/or diagnosis of SARS-CoV-2 by FDA under an Emergency Use Authorization (EUA). This EUA will remain  in effect (meaning this test can be used) for  the duration of the COVID-19 declaration under Section 564(b)(1) of the Act, 21 U.S.C.section 360bbb-3(b)(1), unless the authorization is terminated  or revoked sooner.       Influenza A by PCR NEGATIVE NEGATIVE Final   Influenza B by PCR NEGATIVE NEGATIVE Final    Comment: (NOTE) The Xpert Xpress SARS-CoV-2/FLU/RSV plus assay is intended as an aid in the diagnosis of influenza from Nasopharyngeal swab specimens and should not be used as a sole basis for treatment. Nasal washings and aspirates are unacceptable for Xpert Xpress SARS-CoV-2/FLU/RSV testing.  Fact Sheet for Patients: EntrepreneurPulse.com.au  Fact Sheet for Healthcare Providers: IncredibleEmployment.be  This test is not yet approved or cleared by the [place] FDA and has been authorized for detection and/or diagnosis of SARS-CoV-2 by FDA under an Emergency Use Authorization (EUA). This EUA will remain in effect (meaning this test can be used) for the duration of the COVID-19 declaration under Section 564(b)(1) of the Act, 21 U.S.C. section 360bbb-3(b)(1), unless the authorization is terminated or revoked.  Performed at [facility], [address] [place], [place] [address]   Blood culture (routine x 2)     Status: Abnormal   Collection Time: 02/14/21 10:00 PM   Specimen: BLOOD  Result Value Ref Range Status   Specimen Description   Final    BLOOD LEFT ANTECUBITAL Performed at [facility], [address] [place], [place] [address]    Special Requests   Final    BOTTLES DRAWN AEROBIC AND ANAEROBIC Blood Culture adequate volume Performed at [facility], [address] [place], [place] [address]    Culture  Setup Time   Final    GRAM POSITIVE COCCI IN CLUSTERS IN BOTH AEROBIC AND ANAEROBIC BOTTLES CRITICAL RESULT CALLED TO, READ BACK BY AND VERIFIED WITH: RN SUZANNA LOUDERMILK BY MESSAN H. AT [place] ON 6 20 2022    Culture (A)   Final    STAPHYLOCOCCUS CAPITIS THE SIGNIFICANCE OF ISOLATING THIS ORGANISM FROM A SINGLE SET OF BLOOD CULTURES WHEN MULTIPLE SETS ARE DRAWN IS UNCERTAIN. PLEASE NOTIFY THE MICROBIOLOGY DEPARTMENT WITHIN ONE WEEK IF SPECIATION AND SENSITIVITIES ARE REQUIRED. Performed at [facility], [place] [address], [place], [place] [address]    Report Status 02/17/2021 FINAL  Final  Blood Culture ID Panel (Reflexed)     Status: Abnormal   Collection Time: 02/14/21 10:00 PM  Result Value Ref Range Status   Enterococcus faecalis NOT DETECTED NOT DETECTED Final   Enterococcus Faecium NOT DETECTED NOT DETECTED Final   Listeria monocytogenes NOT DETECTED NOT DETECTED Final   Staphylococcus species DETECTED (A) NOT DETECTED Final    Comment: CRITICAL RESULT CALLED TO, READ BACK BY AND VERIFIED WITH: RN SUZANNA LOUDERMILK BY MESSAN H. AT [id number] ON 6 20 2022    Staphylococcus aureus (BCID) NOT DETECTED NOT DETECTED Final   Staphylococcus epidermidis NOT DETECTED NOT DETECTED Final   Staphylococcus lugdunensis NOT DETECTED NOT DETECTED Final   Streptococcus species NOT DETECTED NOT DETECTED Final   Streptococcus agalactiae NOT DETECTED NOT DETECTED Final   Streptococcus pneumoniae NOT DETECTED NOT DETECTED Final   Streptococcus pyogenes NOT DETECTED NOT DETECTED Final   A.calcoaceticus-baumannii NOT DETECTED NOT DETECTED Final   Bacteroides fragilis NOT DETECTED NOT DETECTED Final   Enterobacterales NOT  DETECTED NOT DETECTED Final   Enterobacter cloacae complex NOT DETECTED NOT DETECTED Final   Escherichia coli NOT DETECTED NOT DETECTED Final   Klebsiella aerogenes NOT DETECTED NOT DETECTED Final   Klebsiella oxytoca NOT DETECTED NOT DETECTED Final   Klebsiella pneumoniae NOT DETECTED NOT DETECTED Final   Proteus species NOT DETECTED NOT DETECTED Final   Salmonella species NOT DETECTED NOT DETECTED Final   Serratia marcescens NOT DETECTED NOT DETECTED Final   Haemophilus influenzae NOT DETECTED NOT  DETECTED Final   Neisseria meningitidis NOT DETECTED NOT DETECTED Final   Pseudomonas aeruginosa NOT DETECTED NOT DETECTED Final   Stenotrophomonas maltophilia NOT DETECTED NOT DETECTED Final   Candida albicans NOT DETECTED NOT DETECTED Final   Candida auris NOT DETECTED NOT DETECTED Final   Candida glabrata NOT DETECTED NOT DETECTED Final   Candida krusei NOT DETECTED NOT DETECTED Final   Candida parapsilosis NOT DETECTED NOT DETECTED Final   Candida tropicalis NOT DETECTED NOT DETECTED Final   Cryptococcus neoformans/gattii NOT DETECTED NOT DETECTED Final    Comment: Performed at [facility], [place] [address], [place], [place] [address]  Blood culture (routine x 2)     Status: None (Preliminary result)   Collection Time: 02/14/21 10:10 PM   Specimen: BLOOD  Result Value Ref Range Status   Specimen Description   Final    BLOOD RIGHT ANTECUBITAL Performed at [facility], [address] [place], [place] [address]    Special Requests   Final    BOTTLES DRAWN AEROBIC AND ANAEROBIC Blood Culture adequate volume Performed at [facility], [address] [place], [place] [address]    Culture   Final    NO GROWTH 3 DAYS Performed at [facility], [place] [address] [place], [place] [address]    Report Status PENDING  Incomplete  Culture, blood (Routine X 2) w Reflex to ID Panel     Status: None (Preliminary result)   Collection Time: 02/16/21  5:36 PM   Specimen: Chest; Blood  Result Value Ref Range Status   Specimen Description   Final    CHEST RIGHT Performed at [facility] [address] [place], [place] [address]    Special Requests   Final    BOTTLES DRAWN AEROBIC AND ANAEROBIC Blood Culture adequate volume Performed at [place] [address], [place], [place] [address]    Culture   Final    NO GROWTH 2 DAYS Performed at [facility] [address] [place], [place] [address]    Report  Status PENDING  Incomplete  Culture, blood (Routine X 2) w Reflex to ID Panel     Status: None (Preliminary result)   Collection Time: 02/16/21  5:36 PM   Specimen: Chest; Blood  Result Value Ref Range Status   Specimen Description   Final    CHEST RIGHT Performed at [facility] [address], [place], [place] [address]    Special Requests   Final    BOTTLES DRAWN AEROBIC AND ANAEROBIC Blood Culture adequate volume Performed at [facility] [address] [place], [place] [address]    Culture   Final    NO GROWTH 2 DAYS Performed at [facility] [address], [place], [place] [address]    Report Status PENDING  Incomplete  Resp Panel by RT-PCR (Flu A&B, Covid) Nasopharyngeal Swab     Status: None  Collection Time: 02/16/21  5:59 PM   Specimen: Nasopharyngeal Swab; Nasopharyngeal(NP) swabs in vial transport medium  Result Value Ref Range Status   SARS Coronavirus 2 by RT PCR NEGATIVE NEGATIVE Final    Comment: (NOTE) SARS-CoV-2 target nucleic acids are NOT DETECTED.  The SARS-CoV-2 RNA is generally detectable in upper respiratory specimens during the acute phase of infection. The lowest concentration of SARS-CoV-2 viral copies this assay can detect is 138 copies/mL. A negative result does not preclude SARS-Cov-2 infection and should not be used as the sole basis for treatment or other patient management decisions. A negative result may occur with  improper specimen collection/handling, submission of specimen other than nasopharyngeal swab, presence of viral mutation(s) within the areas targeted by this assay, and inadequate number of viral copies(<138 copies/mL). A negative result must be combined with clinical observations, patient history, and epidemiological information. The expected result is Negative.  Fact Sheet for Patients:  EntrepreneurPulse.com.au  Fact Sheet for Healthcare Providers:   IncredibleEmployment.be  This test is no t yet approved or cleared by the [place] FDA and  has been authorized for detection and/or diagnosis of SARS-CoV-2 by FDA under an Emergency Use Authorization (EUA). This EUA will remain  in effect (meaning this test can be used) for the duration of the COVID-19 declaration under Section 564(b)(1) of the Act, 21 U.S.C.section 360bbb-3(b)(1), unless the authorization is terminated  or revoked sooner.       Influenza A by PCR NEGATIVE NEGATIVE Final   Influenza B by PCR NEGATIVE NEGATIVE Final    Comment: (NOTE) The Xpert Xpress SARS-CoV-2/FLU/RSV plus assay is intended as an aid in the diagnosis of influenza from Nasopharyngeal swab specimens and should not be used as a sole basis for treatment. Nasal washings and aspirates are unacceptable for Xpert Xpress SARS-CoV-2/FLU/RSV testing.  Fact Sheet for Patients: EntrepreneurPulse.com.au  Fact Sheet for Healthcare Providers: IncredibleEmployment.be  This test is not yet approved or cleared by the [place] FDA and has been authorized for detection and/or diagnosis of SARS-CoV-2 by FDA under an Emergency Use Authorization (EUA). This EUA will remain in effect (meaning this test can be used) for the duration of the COVID-19 declaration under Section 564(b)(1) of the Act, 21 U.S.C. section 360bbb-3(b)(1), unless the authorization is terminated or revoked.  Performed at [facility], [place] [address] [place], [place] [address]              Oswald Hillock   [facility] If 7PM-7AM, please contact night-coverage at [email or web address], Office  [phone]   02/18/2021, 6:22 PM  LOS: 1 day

## 2021-02-18 NOTE — Telephone Encounter (Signed)
Post ED Visit - Positive Culture Follow-up  Culture report reviewed by antimicrobial stewardship pharmacist: [place] Team []  Elenor Quinones, Pharm.D. []  Heide Guile, Pharm.D., BCPS AQ-ID []  Parks Neptune, Pharm.D., BCPS []  Alycia Rossetti, Pharm.D., BCPS []  [place], [place].D., BCPS, AAHIVP []  Legrand Como, Pharm.D., BCPS, AAHIVP []  Salome Arnt, PharmD, BCPS []  Johnnette Gourd, PharmD, BCPS []  Hughes Better, PharmD, BCPS []  Leeroy Cha, PharmD []  Laqueta Linden, PharmD, BCPS []  Albertina Parr, PharmD  [place] Team []  Leodis Sias, PharmD []  Lindell Spar, PharmD []  Royetta Asal, PharmD []  Graylin Shiver, Rph []  Rema Fendt) Glennon Mac, PharmD []  Arlyn Dunning, PharmD []  Netta Cedars, PharmD []  Dia Sitter, PharmD []  Leone Haven, PharmD []  Gretta Arab, PharmD []  Theodis Shove, PharmD []  Peggyann Juba, PharmD []  Reuel Boom, PharmD   Positive blood culture Treated with doxycycline, currently a inpatient receiving vancomycin, no further patient follow-up is required at this time.  Hazle Nordmann 02/18/2021, 1:08 PM

## 2021-02-19 ENCOUNTER — Telehealth (HOSPITAL_COMMUNITY): Payer: Medicare Other | Admitting: Nurse Practitioner

## 2021-02-19 ENCOUNTER — Encounter (HOSPITAL_COMMUNITY): Payer: Self-pay

## 2021-02-19 LAB — BASIC METABOLIC PANEL
Anion gap: 5 (ref 5–15)
BUN: 21 mg/dL (ref 8–23)
CO2: 27 mmol/L (ref 22–32)
Calcium: 9.1 mg/dL (ref 8.9–10.3)
Chloride: 106 mmol/L (ref 98–111)
Creatinine, Ser: 1.55 mg/dL — ABNORMAL HIGH (ref 0.61–1.24)
GFR, Estimated: 49 mL/min — ABNORMAL LOW (ref >60)
Glucose, Bld: 103 mg/dL — ABNORMAL HIGH (ref 70–99)
Potassium: 4.1 mmol/L (ref 3.5–5.1)
Sodium: 138 mmol/L (ref 135–145)

## 2021-02-19 MED ORDER — OXYCODONE-ACETAMINOPHEN 5-325 MG PO TABS: 1.0000 | ORAL_TABLET | ORAL | 0 refills | Status: DC | PRN

## 2021-02-19 MED ORDER — HEPARIN SOD (PORK) LOCK FLUSH 100 UNIT/ML IV SOLN
500.0000 [IU] | Freq: Once | INTRAVENOUS | Status: AC
  Administered 2021-02-19: 500 [IU] via INTRAVENOUS
  Filled 2021-02-19: qty 5

## 2021-02-19 NOTE — Progress Notes (Signed)
Patient discharged to home, discharge instructions reviewed with patient who verbalized understanding.  

## 2021-02-19 NOTE — Discharge Summary (Addendum)
Physician Discharge Summary  [name] BJS:[id number] DOB: 1956/01/21 DOA: 02/16/2021  PCP: [name], MD  Admit date: 02/16/2021 Discharge date: 02/19/2021  Time spent: 60 minutes  Recommendations for Outpatient Follow-up:  Follow-up neurosurgery as outpatient Get BMP checked in 1 week Follow-up oncology as soon as possible   Discharge Diagnoses:  Active Problems:   Positive blood culture   Positive blood cultures    Low back pain  Discharge Condition: Stable  Diet recommendation: Heart healthy diet  Filed Weights   02/16/21 2151  Weight: 88.1 kg    History of present illness:  65 year old Caucasian male with history of 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  He presented to ED on 02/14/2021 for persistent right-sided flank pain since he had lobectomy.  CT chest abdomen/pelvis showed patchy infiltrates in right lung with small left pleural effusion.  Blood cultures obtained at that time and patient was discharged doxycycline. Blood cultures 1 out of 2 bottles was growing staph S so he was sent to ED for further evaluation.  Hospital Course:   Low back pain -Patient has a longstanding history of working in [employer] -Does have history of low back pain -MRI of thoracic spine from 2020 showed paramedian disc protrusion at T8-T9 -MRI lumbar and thoracic spine obtained showed disc protrusion at L3 and L4, discussed with neurosurgeon [name].  He will see patient as outpatient.   Positive blood cultures -1 set of blood cultures growing staph S, rest of blood cultures are negative to date. -Discussed with ID, Dr. West Bali; she recommends no treatment, likely contamination -IV  antibiotics were discontinued -Repeat blood culture drawn on 02/16/2021 is negative to date.   Acute kidney injury -Likely from poor p.o. intake -Started on IV normal 100 ml/h -Creatinine has improved to 1.55.  Patient can be discharged home.  He is hemodynamically stable.  Recommend to check BMP in 1 week as outpatient.   History of CAD s/p CABG in 2017 and DES in 2019 -Stable -Continue aspirin, Plavix, metoprolol, ranolazine   Paroxysmal atrial fibrillation -Rate controlled -Continue amiodarone, metoprolol - CHA2DS2VASc score is 3 -Not on anticoagulation -Patient to follow-up with cardiology to discuss anticoagulation   CVA without residual deficits -Stable -Continue Plavix, aspirin     History of ischemic cardiomyopathy/systolic CHF -Last TTE showed EF of 55 to 60% with grade 1 diastolic dysfunction -Stable  Neuroendocrine lung cancer with brain metastasis -S/p chemotherapy and radiation to brain -WBC was down to 2.6 yesterday.  Called and left message for Dr. [name].  Patient can follow-up with Dr. [name] in 1 week.      Procedures:   Consultations:   Discharge Exam: Vitals:   02/19/21 0444 02/19/21 1426  BP: (!) 143/96 134/76  Pulse: 74 64  Resp: 18 15  Temp: 97.7 F (36.5 C) 98.4 F (36.9 C)  SpO2: 100% 96%    General: Appears in no acute distress Cardiovascular: S1-S2, regular Respiratory: Clear to auscultation bilaterally  Discharge Instructions   Discharge Instructions     Diet - low sodium heart healthy   Complete by: As directed    Increase activity slowly   Complete by: As directed       Allergies as of 02/19/2021  Reactions   Prednisone Other (See Comments)   States that this med makes him "crazy"   Tetanus Toxoids Swelling, Other (See Comments)   Fever, Swelling of the arm    Wellbutrin [bupropion] Other (See Comments)   Crazy thoughts, nightmares   Indomethacin Other (See Comments)   rectal bleeding   Other    Other  reaction(s): Unknown   Scallops [shellfish Allergy] Nausea Only   Varenicline Other (See Comments)   Dreams Other reaction(s): Unknown        Medication List     STOP taking these medications    cephALEXin 500 MG capsule Commonly known as: KEFLEX   chlordiazePOXIDE 10 MG capsule Commonly known as: LIBRIUM   cloNIDine 0.1 MG tablet Commonly known as: CATAPRES   doxycycline 100 MG capsule Commonly known as: VIBRAMYCIN       TAKE these medications    acetaminophen 500 MG tablet Commonly known as: TYLENOL Take 1,000 mg by mouth every 6 (six) hours as needed for moderate pain.   albuterol 108 (90 Base) MCG/ACT inhaler Commonly known as: VENTOLIN HFA Inhale 2 puffs into the lungs every 6 (six) hours as needed for wheezing or shortness of breath.   amiodarone 200 MG tablet Commonly known as: PACERONE Take 1 tablet (200 mg total) by mouth daily. What changed: when to take this   amLODipine 10 MG tablet Commonly known as: NORVASC TAKE 1 TABLET(10 MG) BY MOUTH DAILY What changed: See the new instructions.   aspirin EC 81 MG tablet Take 81 mg by mouth in the morning. Swallow whole.   atorvastatin 80 MG tablet Commonly known as: LIPITOR TAKE 1 TABLET(80 MG) BY MOUTH DAILY What changed:  how much to take how to take this when to take this additional instructions   clopidogrel 75 MG tablet Commonly known as: PLAVIX TAKE 1 TABLET BY MOUTH EVERY DAY What changed: when to take this   escitalopram 10 MG tablet Commonly known as: LEXAPRO Take 10 mg by mouth daily.   fluticasone 50 MCG/ACT nasal spray Commonly known as: FLONASE Place 2 sprays into both nostrils daily.   lidocaine-prilocaine cream Commonly known as: EMLA Apply 1 application topically as needed. What changed:  when to take this reasons to take this   metoprolol succinate 25 MG 24 hr tablet Commonly known as: TOPROL-XL Take 1 tablet (25 mg total) by mouth daily. What changed: when to take  this   multivitamin with minerals Tabs tablet Take 1 tablet by mouth in the morning. Centrum   nitroGLYCERIN 0.4 MG SL tablet Commonly known as: NITROSTAT PLACE 1 TABLET UNDER THE TONGUE EVERY 5 MINUTES AS NEEDED FOR CHEST PAIN. 3 DOSES MAX What changed:  how much to take how to take this when to take this reasons to take this additional instructions   ondansetron 8 MG tablet Commonly known as: ZOFRAN Take 1 tablet (8 mg total) by mouth every 8 (eight) hours as needed for nausea or vomiting. Starting 3 days after chemotherapy   oxyCODONE-acetaminophen 5-325 MG tablet Commonly known as: PERCOCET/ROXICET Take 1 tablet by mouth every 4 (four) hours as needed for severe pain.   prochlorperazine 10 MG tablet Commonly known as: COMPAZINE Take 1 tablet (10 mg total) by mouth every 6 (six) hours as needed. What changed: reasons to take this   ranolazine 1000 MG SR tablet Commonly known as: RANEXA Take 1 tablet (1,000 mg total) by mouth 2 (two) times daily. What changed: when to take this   Stiolto  Respimat 2.5-2.5 MCG/ACT Aers Generic drug: Tiotropium Bromide-Olodaterol Inhale 2 puffs into the lungs daily. What changed:  how much to take when to take this       Allergies  Allergen Reactions   Prednisone Other (See Comments)    States that this med makes him "crazy"   Tetanus Toxoids Swelling and Other (See Comments)    Fever, Swelling of the arm    Wellbutrin [Bupropion] Other (See Comments)    Crazy thoughts, nightmares   Indomethacin Other (See Comments)    rectal bleeding   Other     Other reaction(s): Unknown   Scallops [Shellfish Allergy] Nausea Only   Varenicline Other (See Comments)    Dreams Other reaction(s): Unknown      The results of significant diagnostics from this hospitalization (including imaging, microbiology, ancillary and laboratory) are listed below for reference.    Significant Diagnostic Studies: DG Abd 1 View  Result Date:  02/10/2021 CLINICAL DATA:  Right flank pain EXAM: ABDOMEN - 1 VIEW COMPARISON:  12/20/2018 FINDINGS: The bowel gas pattern is normal. No radio-opaque calculi or other significant radiographic abnormality are seen. Small right pleural effusion. Partially imaged port catheter at the cavoatrial junction. IMPRESSION: No urinary tract calculi identified. Electronically Signed   By: Macy Mis M.D.   On: 02/10/2021 14:02   CT Angio Chest PE W and/or Wo Contrast  Result Date: 02/14/2021 CLINICAL DATA:  Recent surgery. Cancer. Right flank pain. Tachycardia. Pulmonary embolus suspected with high probability. Recent diagnosis of UTI without improvement. History of lung cancer metastatic to brain. EXAM: CT ANGIOGRAPHY CHEST CT ABDOMEN AND PELVIS WITH CONTRAST TECHNIQUE: Multidetector CT imaging of the chest was performed using the standard protocol during bolus administration of intravenous contrast. Multiplanar CT image reconstructions and MIPs were obtained to evaluate the vascular anatomy. Multidetector CT imaging of the abdomen and pelvis was performed using the standard protocol during bolus administration of intravenous contrast. CONTRAST:  138mL OMNIPAQUE IOHEXOL 350 MG/ML SOLN COMPARISON:  CT chest 12/12/2020.  CT abdomen and pelvis 02/11/2021 FINDINGS: CTA CHEST FINDINGS Cardiovascular: There is good opacification of the central and segmental pulmonary arteries. No focal filling defects. No evidence of significant pulmonary embolus. Ascending aortic aneurysm measuring 4 cm in diameter, unchanged. No aortic dissection. Great vessel origins are patent. Coronary artery and aortic calcification. Postoperative changes consistent with coronary bypass. Normal heart size. No pericardial effusions. Mediastinum/Nodes: Esophagus is decompressed. No significant lymphadenopathy in the chest. Scattered lymph nodes are not pathologically enlarged. Lungs/Pleura: Small right pleural effusion. Mild patchy areas of airspace  disease in the right lung could represent early multifocal pneumonia. Left lung is clear. Previous right upper lung nodule has been resected in the interval. Musculoskeletal: Degenerative changes in the spine. Postoperative changes in the lower cervical spine. Sternotomy wires. Old rib fractures. Review of the MIP images confirms the above findings. CT ABDOMEN and PELVIS FINDINGS Hepatobiliary: Subcentimeter lesion in the dome of the liver likely representing a cavernous hemangioma. Gallbladder and bile ducts are unremarkable. Pancreas: Unremarkable. No pancreatic ductal dilatation or surrounding inflammatory changes. Spleen: Normal in size without focal abnormality. Adrenals/Urinary Tract: No adrenal gland nodules. Cyst in the upper pole left kidney. Nephrograms are symmetrical and otherwise homogeneous. No hydronephrosis or hydroureter. Bladder is unremarkable. Stomach/Bowel: Stomach, small bowel, and colon are not abnormally distended. No wall thickening or inflammatory changes are apparent. Scattered diverticula in the sigmoid colon without evidence of diverticulitis. Appendix is normal. Vascular/Lymphatic: Aortic atherosclerosis. No enlarged abdominal or pelvic lymph nodes. Reproductive:  Prostate is unremarkable. Other: No free air or free fluid in the abdomen. Abdominal wall musculature appears intact. Musculoskeletal: Degenerative changes in the spine. No destructive bone lesions. Review of the MIP images confirms the above findings. IMPRESSION: 1. No evidence of significant pulmonary embolus. 2. Small right pleural effusion. Patchy infiltrates in the right lung are nonspecific but could indicate early multifocal pneumonia. 3. No acute process demonstrated in the abdomen or pelvis. 4. Aortic atherosclerosis. Electronically Signed   By: Lucienne Capers M.D.   On: 02/14/2021 22:09   MR Brain W Wo Contrast  Result Date: 02/05/2021 CLINICAL DATA:  Follow-up CNS neoplasm EXAM: MRI HEAD WITHOUT AND WITH  CONTRAST TECHNIQUE: Multiplanar, multiecho pulse sequences of the brain and surrounding structures were obtained without and with intravenous contrast. CONTRAST:  58mL MULTIHANCE GADOBENATE DIMEGLUMINE 529 MG/ML IV SOLN COMPARISON:  01/27/2021 FINDINGS: BRAIN New Lesions: 3 mm nodule at the high left and parasagittal frontal lobe seen on 12:147 Larger lesions: None. Stable or Smaller lesions: 6 mm mm enhancing lesion located in the lateral left cerebellum and seen on 11:40. Other Brain findings: Mild vasogenic edema around the left cerebellar metastasis. No acute infarct, acute hemorrhage, hydrocephalus, or collection. Vascular: Normal flow voids and vascular enhancements Skull and upper cervical spine: Normal marrow signal Sinuses/Orbits: Negative IMPRESSION: 1. 3 mm newly seen metastasis in the high left frontal lobe. 2. 6 mm unchanged left cerebellar metastasis. Electronically Signed   By: Monte Fantasia M.D.   On: 02/05/2021 06:09   MR Brain W Wo Contrast  Result Date: 01/27/2021 CLINICAL DATA:  High-grade neuroendocrine carcinoma. Assess for metastatic disease. EXAM: MRI HEAD WITHOUT AND WITH CONTRAST TECHNIQUE: Multiplanar, multiecho pulse sequences of the brain and surrounding structures were obtained without and with intravenous contrast. CONTRAST:  38mL GADAVIST GADOBUTROL 1 MMOL/ML IV SOLN COMPARISON:  02/23/2020 FINDINGS: Brain: Diffusion imaging does not show any acute or subacute infarction. Newly seen 6.5 mm ring-enhancing lesion in the left lateral cerebellum consistent with a small metastasis. Minimal adjacent edema. There is some susceptibility artifact associated with this metastasis likely indicating microhemorrhage. No second intracranial metastatic lesion. Minimal small vessel change affects the hemispheric white matter. No large vessel territory infarction. No hydrocephalus or extra-axial collection. Vascular: Major vessels at the base of the brain show flow. Skull and upper cervical spine:  Negative Sinuses/Orbits: Clear/normal Other: None IMPRESSION: 6.5 mm ring-enhancing lesion of the left lateral cerebellum with some hemosiderin/blood products, consistent with a solitary metastasis. Minimal adjacent edema. No second lesion identified. Electronically Signed   By: Nelson Chimes M.D.   On: 01/27/2021 14:01   MR THORACIC SPINE WO CONTRAST  Result Date: 02/19/2021 CLINICAL DATA:  Initial evaluation for intervertebral disc disorder, ongoing back pain. EXAM: MRI THORACIC SPINE WITHOUT CONTRAST TECHNIQUE: Multiplanar, multisequence MR imaging of the thoracic spine was performed. No intravenous contrast was administered. COMPARISON:  Previous MRI from 09/28/2018. FINDINGS: Alignment: Physiologic with preservation of the normal thoracic kyphosis. No listhesis. Vertebrae: Vertebral body height well maintained without acute or chronic fracture. Bone marrow signal intensity within normal limits. No discrete or worrisome osseous lesions. No abnormal marrow edema. Susceptibility artifact from prior ACDF noted at C7-T1. Cord:  Normal signal and morphology. Paraspinal and other soft tissues: Paraspinous soft tissues within normal limits. Small to moderate layering left pleural effusion. Visualized visceral structures otherwise unremarkable. Disc levels: T1-2:  Unremarkable. T2-3: Unremarkable. T3-4:  Negative interspace.  Mild facet hypertrophy.  No stenosis. T4-5: Negative interspace. Mild right-sided facet hypertrophy. No stenosis. T5-6:  Negative interspace.  Mild facet hypertrophy.  No stenosis. T6-7:  Unremarkable. T7-8:  Unremarkable. T8-9: Right paracentral to subarticular disc protrusion indents the right ventral thecal sac (series 21, image 26). Mild flattening of the right ventral cord without cord signal changes or significant spinal stenosis. Superimposed right-sided reactive endplate spurring. Foramina remain patent. T9-10: Unremarkable. T10-11:  Unremarkable. T11-12: Minimal disc bulge. Mild facet  hypertrophy. No canal or foraminal stenosis. T12-L1:  Unremarkable. IMPRESSION: 1. Right paracentral disc protrusion at T8-9 with secondary mild flattening of the right ventral cord, but no cord signal changes or significant stenosis. 2. Mild multilevel facet hypertrophy throughout the thoracic spine as detailed above. Findings could contribute to back pain. 3. Small to moderate layering left pleural effusion. Electronically Signed   By: Jeannine Boga M.D.   On: 02/19/2021 01:54   MR LUMBAR SPINE WO CONTRAST  Result Date: 02/19/2021 CLINICAL DATA:  Initial evaluation for ongoing lower back pain. EXAM: MRI LUMBAR SPINE WITHOUT CONTRAST TECHNIQUE: Multiplanar, multisequence MR imaging of the lumbar spine was performed. No intravenous contrast was administered. COMPARISON:  Prior radiograph from 09/10/2014. FINDINGS: Segmentation: Standard. Lowest well-formed disc space labeled the L5-S1 level. Alignment: Trace retrolisthesis of L2 on L3. Alignment otherwise normal with preservation of the normal lumbar lordosis. Vertebrae: Vertebral body height well maintained without acute or chronic fracture. Bone marrow signal intensity within normal limits. 1.5 cm benign hemangioma noted within the L1 vertebral body. No other discrete or worrisome osseous lesions. No abnormal marrow edema. Conus medullaris and cauda equina: Conus extends to the L1 level. Conus and cauda equina appear normal. Paraspinal and other soft tissues: Paraspinous soft tissues within normal limits. 2.4 cm T2 hyperintense simple cyst noted within the interpolar left kidney. Visualized visceral structures otherwise unremarkable. Disc levels: L1-2: Degenerative intervertebral disc space narrowing with disc desiccation and diffuse disc bulge. Associated mild reactive endplate change. Minimal facet spurring. No significant spinal stenosis. Foramina remain patent. L2-3: Trace retrolisthesis. Degenerative intervertebral disc space narrowing with diffuse  disc bulge and disc desiccation. Mild facet and ligament flavum hypertrophy. No significant spinal stenosis. Foramina remain patent. L3-4: Degenerative intervertebral disc space narrowing with disc desiccation and diffuse disc bulge. Superimposed broad-based left foraminal to extraforaminal disc protrusion contacts the exiting left L4 nerve root as it courses of the left neural foramen (series 8, image 22). Mild bilateral facet hypertrophy. No significant spinal stenosis. Mild left L3 foraminal narrowing. Right neural foramen remains patent. L4-5: Negative interspace. Mild to moderate bilateral facet hypertrophy. Borderline mild narrowing of the lateral recesses bilaterally. Central canal remains patent. Mild bilateral L4 foraminal stenosis. No frank impingement. L5-S1: Disc desiccation with mild disc bulge, slightly eccentric to the right. Associated right paracentral annular fissure at the level of the descending right S1 nerve root sheath. Mild bilateral facet hypertrophy. No canal or lateral recess stenosis. Foramina remain patent. No impingement. IMPRESSION: 1. Broad-based left foraminal to extraforaminal disc protrusion at L3-4, contacting and potentially irritating the exiting left L4 nerve root. 2. Right eccentric disc bulge with annular fissure at L5-S1, closely approximating and potentially irritating the descending right S1 nerve root. 3. Additional mild for age degenerative disc disease and facet hypertrophy elsewhere within the lumbar spine as above. No other significant stenosis or neural impingement. Electronically Signed   By: Jeannine Boga M.D.   On: 02/19/2021 02:02   CT ABDOMEN PELVIS W CONTRAST  Result Date: 02/14/2021 CLINICAL DATA:  Recent surgery. Cancer. Right flank pain. Tachycardia. Pulmonary embolus suspected with high probability. Recent  diagnosis of UTI without improvement. History of lung cancer metastatic to brain. EXAM: CT ANGIOGRAPHY CHEST CT ABDOMEN AND PELVIS WITH  CONTRAST TECHNIQUE: Multidetector CT imaging of the chest was performed using the standard protocol during bolus administration of intravenous contrast. Multiplanar CT image reconstructions and MIPs were obtained to evaluate the vascular anatomy. Multidetector CT imaging of the abdomen and pelvis was performed using the standard protocol during bolus administration of intravenous contrast. CONTRAST:  166mL OMNIPAQUE IOHEXOL 350 MG/ML SOLN COMPARISON:  CT chest 12/12/2020.  CT abdomen and pelvis 02/11/2021 FINDINGS: CTA CHEST FINDINGS Cardiovascular: There is good opacification of the central and segmental pulmonary arteries. No focal filling defects. No evidence of significant pulmonary embolus. Ascending aortic aneurysm measuring 4 cm in diameter, unchanged. No aortic dissection. Great vessel origins are patent. Coronary artery and aortic calcification. Postoperative changes consistent with coronary bypass. Normal heart size. No pericardial effusions. Mediastinum/Nodes: Esophagus is decompressed. No significant lymphadenopathy in the chest. Scattered lymph nodes are not pathologically enlarged. Lungs/Pleura: Small right pleural effusion. Mild patchy areas of airspace disease in the right lung could represent early multifocal pneumonia. Left lung is clear. Previous right upper lung nodule has been resected in the interval. Musculoskeletal: Degenerative changes in the spine. Postoperative changes in the lower cervical spine. Sternotomy wires. Old rib fractures. Review of the MIP images confirms the above findings. CT ABDOMEN and PELVIS FINDINGS Hepatobiliary: Subcentimeter lesion in the dome of the liver likely representing a cavernous hemangioma. Gallbladder and bile ducts are unremarkable. Pancreas: Unremarkable. No pancreatic ductal dilatation or surrounding inflammatory changes. Spleen: Normal in size without focal abnormality. Adrenals/Urinary Tract: No adrenal gland nodules. Cyst in the upper pole left kidney.  Nephrograms are symmetrical and otherwise homogeneous. No hydronephrosis or hydroureter. Bladder is unremarkable. Stomach/Bowel: Stomach, small bowel, and colon are not abnormally distended. No wall thickening or inflammatory changes are apparent. Scattered diverticula in the sigmoid colon without evidence of diverticulitis. Appendix is normal. Vascular/Lymphatic: Aortic atherosclerosis. No enlarged abdominal or pelvic lymph nodes. Reproductive: Prostate is unremarkable. Other: No free air or free fluid in the abdomen. Abdominal wall musculature appears intact. Musculoskeletal: Degenerative changes in the spine. No destructive bone lesions. Review of the MIP images confirms the above findings. IMPRESSION: 1. No evidence of significant pulmonary embolus. 2. Small right pleural effusion. Patchy infiltrates in the right lung are nonspecific but could indicate early multifocal pneumonia. 3. No acute process demonstrated in the abdomen or pelvis. 4. Aortic atherosclerosis. Electronically Signed   By: Lucienne Capers M.D.   On: 02/14/2021 22:09   CT ABDOMEN PELVIS W CONTRAST  Result Date: 02/11/2021 CLINICAL DATA:  Right flank pain and right upper quadrant tenderness. Kidney stones suspected. Recent surgery for cancer removal. First chemotherapy yesterday. EXAM: CT ABDOMEN AND PELVIS WITH CONTRAST TECHNIQUE: Multidetector CT imaging of the abdomen and pelvis was performed using the standard protocol following bolus administration of intravenous contrast. CONTRAST:  17mL OMNIPAQUE IOHEXOL 300 MG/ML  SOLN COMPARISON:  11/18/2020 FINDINGS: Lower chest: Small right pleural effusion. Hepatobiliary: No focal liver abnormality is seen. No gallstones, gallbladder wall thickening, or biliary dilatation. Pancreas: Unremarkable. No pancreatic ductal dilatation or surrounding inflammatory changes. Spleen: Normal in size without focal abnormality. Adrenals/Urinary Tract: No adrenal gland nodules. Small cyst on the left kidney.  Nephrograms are symmetrical. No hydronephrosis or hydroureter. No renal or ureteral stones identified. Bladder is unremarkable. Stomach/Bowel: Stomach, small bowel, and colon are not abnormally distended. No wall thickening or inflammatory changes are appreciated. Appendix is normal. Vascular/Lymphatic: Aortic atherosclerosis.  No enlarged abdominal or pelvic lymph nodes. Reproductive: Prostate is unremarkable. Other: No free air or free fluid in the abdomen. Small focal subcutaneous emphysema in the anterior abdominal wall, likely injection site. No infiltration or collection in the subcutaneous fat to suggest postoperative complication. Musculoskeletal: No acute or significant osseous findings. IMPRESSION: 1. No renal or ureteral stone or obstruction. 2. No acute process demonstrated in the abdomen or pelvis. 3. Aortic atherosclerosis. 4. Small right pleural effusion. Electronically Signed   By: Lucienne Capers M.D.   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 He presents for port catheter placement to establish durable venous access. EXAM: IMPLANTED PORT A CATH PLACEMENT WITH ULTRASOUND AND FLUOROSCOPIC GUIDANCE MEDICATIONS: None. ANESTHESIA/SEDATION: Versed 2 mg IV; Fentanyl 100 mcg IV; Moderate Sedation Time:  23 minutes The patient was continuously monitored during the procedure by the interventional radiology nurse under my direct supervision. FLUOROSCOPY TIME:  0 minutes, 18 seconds (6 mGy) COMPLICATIONS: None immediate. PROCEDURE: The right neck and chest was prepped with chlorhexidine, and draped in the usual sterile fashion using maximum barrier technique (cap and mask, sterile gown, sterile gloves, large sterile sheet, hand hygiene and cutaneous antiseptic). Local anesthesia was attained by infiltration with 1% lidocaine with epinephrine. Ultrasound demonstrated patency of the  right internal jugular vein, and this was documented with an image. Under real-time ultrasound guidance, this vein was accessed with a 21 gauge micropuncture needle and image documentation was performed. A small dermatotomy was made at the access site with an 11 scalpel. A 0.018" wire was advanced into the SVC and the access needle exchanged for a 23F micropuncture vascular sheath. The 0.018" wire was then removed and a 0.035" wire advanced into the IVC. An appropriate location for the subcutaneous reservoir was selected below the clavicle and an incision was made through the skin and underlying soft tissues. The subcutaneous tissues were then dissected using a combination of blunt and sharp surgical technique and a pocket was formed. A single lumen power injectable portacatheter was then tunneled through the subcutaneous tissues from the pocket to the dermatotomy and the port reservoir placed within the subcutaneous pocket. The venous access site was then serially dilated and a peel away vascular sheath placed over the wire. The wire was removed and the port catheter advanced into position under fluoroscopic guidance. The catheter tip is positioned in the superior cavoatrial junction. This was documented with a spot image. The portacatheter was then tested and found to flush and aspirate well. The port was flushed with saline followed by 100 units/mL heparinized saline. The pocket was then closed in two layers using first subdermal inverted interrupted absorbable sutures followed by a running subcuticular suture. The epidermis was then sealed with Dermabond. The dermatotomy at the venous access site was also closed with Dermabond. IMPRESSION: Successful placement of a right IJ approach Power Port with ultrasound and fluoroscopic guidance. The catheter is ready for use. Electronically Signed   By: Jacqulynn Cadet M.D.   On: 02/06/2021 14:33   LONG TERM MONITOR-LIVE TELEMETRY (3-14 DAYS)  Result Date:  02/09/2021  Normal sinus rhythm with rare PACs and PVCs.  Brief, string of PACs not associated with symptoms.  No sustained pathologic arrhythmias.  Patch Wear Time:  14 days and 0 hours (2022-05-14T12:06:36-0400 to 2022-05-28T12:06:36-0400) Patient had a min HR of 45 bpm, max HR of 203 bpm, and avg HR of 82  bpm. Predominant underlying rhythm was Sinus Rhythm. 1 run of Supraventricular Tachycardia occurred lasting 6 beats with a max rate of 203 bpm (avg 199 bpm). Isolated SVEs were rare (<1.0%), SVE Couplets were rare (<1.0%), and SVE Triplets were rare (<1.0%). Isolated VEs were rare (<1.0%), and no VE Couplets or VE Triplets were present.   US Abdomen Limited RUQ (LIVER/GB)  Result Date: 02/11/2021 CLINICAL DATA:  Right upper quadrant pain EXAM: ULTRASOUND ABDOMEN LIMITED RIGHT UPPER QUADRANT COMPARISON:  10/24/2020 FINDINGS: Gallbladder: No gallstones or wall thickening visualized. No sonographic Murphy sign noted by sonographer. Common bile duct: Diameter: Normal caliber, 4 mm Liver: No focal lesion identified. Within normal limits in parenchymal echogenicity. Portal vein is patent on color Doppler imaging with normal direction of blood flow towards the liver. Other: None. IMPRESSION: No acute findings. Electronically Signed   By: Rolm Baptise M.D.   On: 02/11/2021 00:37    Microbiology: Recent Results (from the past 240 hour(s))  Urine culture     Status: None   Collection Time: 02/10/21 11:51 PM   Specimen: Urine, Random  Result Value Ref Range Status   Specimen Description   Final    URINE, RANDOM Performed at [facility], [address] [place], [place] [address]    Special Requests   Final    NONE Performed at [facility], [address] [place], [place] [address]    Culture   Final    NO GROWTH Performed at [facility], [place] [address] [place], [place] [address]    Report Status 02/12/2021 FINAL  Final  Resp Panel by RT-PCR (Flu A&B, Covid)  Nasopharyngeal Swab     Status: None   Collection Time: 02/14/21  9:40 PM   Specimen: Nasopharyngeal Swab; Nasopharyngeal(NP) swabs in vial transport medium  Result Value Ref Range Status   SARS Coronavirus 2 by RT PCR NEGATIVE NEGATIVE Final    Comment: (NOTE) SARS-CoV-2 target nucleic acids are NOT DETECTED.  The SARS-CoV-2 RNA is generally detectable in upper respiratory specimens during the acute phase of infection. The lowest concentration of SARS-CoV-2 viral copies this assay can detect is 138 copies/mL. A negative result does not preclude SARS-Cov-2 infection and should not be used as the sole basis for treatment or other patient management decisions. A negative result may occur with  improper specimen collection/handling, submission of specimen other than nasopharyngeal swab, presence of viral mutation(s) within the areas targeted by this assay, and inadequate number of viral copies(<138 copies/mL). A negative result must be combined with clinical observations, patient history, and epidemiological information. The expected result is Negative.  Fact Sheet for Patients:  EntrepreneurPulse.com.au  Fact Sheet for Healthcare Providers:  IncredibleEmployment.be  This test is no t yet approved or cleared by the [place] FDA and  has been authorized for detection and/or diagnosis of SARS-CoV-2 by FDA under an Emergency Use Authorization (EUA). This EUA will remain  in effect (meaning this test can be used) for the duration of the COVID-19 declaration under Section 564(b)(1) of the Act, 21 U.S.C.section 360bbb-3(b)(1), unless the authorization is terminated  or revoked sooner.       Influenza A by PCR NEGATIVE NEGATIVE Final   Influenza B by PCR NEGATIVE NEGATIVE Final    Comment: (NOTE) The Xpert Xpress SARS-CoV-2/FLU/RSV plus assay is intended as an aid in the diagnosis of influenza from Nasopharyngeal swab specimens and should not be  used as a sole basis for treatment. Nasal  washings and aspirates are unacceptable for Xpert Xpress SARS-CoV-2/FLU/RSV testing.  Fact Sheet for Patients: EntrepreneurPulse.com.au  Fact Sheet for Healthcare Providers: IncredibleEmployment.be  This test is not yet approved or cleared by the [place] FDA and has been authorized for detection and/or diagnosis of SARS-CoV-2 by FDA under an Emergency Use Authorization (EUA). This EUA will remain in effect (meaning this test can be used) for the duration of the COVID-19 declaration under Section 564(b)(1) of the Act, 21 U.S.C. section 360bbb-3(b)(1), unless the authorization is terminated or revoked.  Performed at [facility], [address] [place], [place] [address]   Blood culture (routine x 2)     Status: Abnormal   Collection Time: 02/14/21 10:00 PM   Specimen: BLOOD  Result Value Ref Range Status   Specimen Description   Final    BLOOD LEFT ANTECUBITAL Performed at [facility], [address] [place], [place] [address]    Special Requests   Final    BOTTLES DRAWN AEROBIC AND ANAEROBIC Blood Culture adequate volume Performed at [facility], [address] [place], [place] [address]    Culture  Setup Time   Final    GRAM POSITIVE COCCI IN CLUSTERS IN BOTH AEROBIC AND ANAEROBIC BOTTLES CRITICAL RESULT CALLED TO, READ BACK BY AND VERIFIED WITH: RN SUZANNA LOUDERMILK BY MESSAN H. AT [place] ON 6 20 2022    Culture (A)  Final    STAPHYLOCOCCUS CAPITIS THE SIGNIFICANCE OF ISOLATING THIS ORGANISM FROM A SINGLE SET OF BLOOD CULTURES WHEN MULTIPLE SETS ARE DRAWN IS UNCERTAIN. PLEASE NOTIFY THE MICROBIOLOGY DEPARTMENT WITHIN ONE WEEK IF SPECIATION AND SENSITIVITIES ARE REQUIRED. Performed at [facility], [place] [address] [place], [place] [address]    Report Status 02/17/2021 FINAL  Final  Blood Culture ID Panel (Reflexed)     Status: Abnormal    Collection Time: 02/14/21 10:00 PM  Result Value Ref Range Status   Enterococcus faecalis NOT DETECTED NOT DETECTED Final   Enterococcus Faecium NOT DETECTED NOT DETECTED Final   Listeria monocytogenes NOT DETECTED NOT DETECTED Final   Staphylococcus species DETECTED (A) NOT DETECTED Final    Comment: CRITICAL RESULT CALLED TO, READ BACK BY AND VERIFIED WITH: RN SUZANNA LOUDERMILK BY MESSAN H. AT 6160 ON 6 20 2022    Staphylococcus aureus (BCID) NOT DETECTED NOT DETECTED Final   Staphylococcus epidermidis NOT DETECTED NOT DETECTED Final   Staphylococcus lugdunensis NOT DETECTED NOT DETECTED Final   Streptococcus species NOT DETECTED NOT DETECTED Final   Streptococcus agalactiae NOT DETECTED NOT DETECTED Final   Streptococcus pneumoniae NOT DETECTED NOT DETECTED Final   Streptococcus pyogenes NOT DETECTED NOT DETECTED Final   A.calcoaceticus-baumannii NOT DETECTED NOT DETECTED Final   Bacteroides fragilis NOT DETECTED NOT DETECTED Final   Enterobacterales NOT DETECTED NOT DETECTED Final   Enterobacter cloacae complex NOT DETECTED NOT DETECTED Final   Escherichia coli NOT DETECTED NOT DETECTED Final   Klebsiella aerogenes NOT DETECTED NOT DETECTED Final   Klebsiella oxytoca NOT DETECTED NOT DETECTED Final   Klebsiella pneumoniae NOT DETECTED NOT DETECTED Final   Proteus species NOT DETECTED NOT DETECTED Final   Salmonella species NOT DETECTED NOT DETECTED Final   Serratia marcescens NOT DETECTED NOT DETECTED Final   Haemophilus influenzae NOT DETECTED NOT DETECTED Final   Neisseria meningitidis NOT DETECTED NOT DETECTED Final   Pseudomonas aeruginosa NOT DETECTED NOT DETECTED Final   Stenotrophomonas maltophilia NOT DETECTED NOT DETECTED Final  Candida albicans NOT DETECTED NOT DETECTED Final   Candida auris NOT DETECTED NOT DETECTED Final   Candida glabrata NOT DETECTED NOT DETECTED Final   Candida krusei NOT DETECTED NOT DETECTED Final   Candida parapsilosis NOT DETECTED NOT  DETECTED Final   Candida tropicalis NOT DETECTED NOT DETECTED Final   Cryptococcus neoformans/gattii NOT DETECTED NOT DETECTED Final    Comment: Performed at [facility], [place] [address] [place], [place] [address]  Blood culture (routine x 2)     Status: None (Preliminary result)   Collection Time: 02/14/21 10:10 PM   Specimen: BLOOD  Result Value Ref Range Status   Specimen Description   Final    BLOOD RIGHT ANTECUBITAL Performed at [facility], [address] [place], [place] [address]    Special Requests   Final    BOTTLES DRAWN AEROBIC AND ANAEROBIC Blood Culture adequate volume Performed at [facility], [address] [place], [place] [address]    Culture   Final    NO GROWTH 4 DAYS Performed at [facility], [place] [address] [place], [place] [address]    Report Status PENDING  Incomplete  Culture, blood (Routine X 2) w Reflex to ID Panel     Status: None (Preliminary result)   Collection Time: 02/16/21  5:36 PM   Specimen: Chest; Blood  Result Value Ref Range Status   Specimen Description   Final    CHEST RIGHT Performed at [place] [address], [place], [place] [address]    Special Requests   Final    BOTTLES DRAWN AEROBIC AND ANAEROBIC Blood Culture adequate volume Performed at [place] [address], [place], [place] [address]    Culture   Final    NO GROWTH 3 DAYS Performed at [facility], [place] [address], [place], [place] [address]    Report Status PENDING  Incomplete  Culture, blood (Routine X 2) w Reflex to ID Panel     Status: None (Preliminary result)   Collection Time: 02/16/21  5:36 PM   Specimen: Chest; Blood  Result Value Ref Range Status   Specimen Description   Final    CHEST RIGHT Performed at [place] [address] [place], [place] [address]    Special Requests   Final    BOTTLES DRAWN AEROBIC AND ANAEROBIC Blood Culture  adequate volume Performed at [place] [address], [place], [place] [address]    Culture   Final    NO GROWTH 3 DAYS Performed at [facility], [place] [address], [place], [place] [address]    Report Status PENDING  Incomplete  Resp Panel by RT-PCR (Flu A&B, Covid) Nasopharyngeal Swab     Status: None   Collection Time: 02/16/21  5:59 PM   Specimen: Nasopharyngeal Swab; Nasopharyngeal(NP) swabs in vial transport medium  Result Value Ref Range Status   SARS Coronavirus 2 by RT PCR NEGATIVE NEGATIVE Final    Comment: (NOTE) SARS-CoV-2 target nucleic acids are NOT DETECTED.  The SARS-CoV-2 RNA is generally detectable in upper respiratory specimens during the acute phase of infection. The lowest concentration of SARS-CoV-2 viral copies this assay can detect is 138 copies/mL. A negative result does not preclude SARS-Cov-2 infection and should not be used as the sole basis for treatment or other patient management decisions. A negative result may occur with  improper specimen collection/handling, submission of  specimen other than nasopharyngeal swab, presence of viral mutation(s) within the areas targeted by this assay, and inadequate number of viral copies(<138 copies/mL). A negative result must be combined with clinical observations, patient history, and epidemiological information. The expected result is Negative.  Fact Sheet for Patients:  EntrepreneurPulse.com.au  Fact Sheet for Healthcare Providers:  IncredibleEmployment.be  This test is no t yet approved or cleared by the [place] FDA and  has been authorized for detection and/or diagnosis of SARS-CoV-2 by FDA under an Emergency Use Authorization (EUA). This EUA will remain  in effect (meaning this test can be used) for the duration of the COVID-19 declaration under Section 564(b)(1) of the Act, 21 U.S.C.section 360bbb-3(b)(1), unless the authorization is  terminated  or revoked sooner.       Influenza A by PCR NEGATIVE NEGATIVE Final   Influenza B by PCR NEGATIVE NEGATIVE Final    Comment: (NOTE) The Xpert Xpress SARS-CoV-2/FLU/RSV plus assay is intended as an aid in the diagnosis of influenza from Nasopharyngeal swab specimens and should not be used as a sole basis for treatment. Nasal washings and aspirates are unacceptable for Xpert Xpress SARS-CoV-2/FLU/RSV testing.  Fact Sheet for Patients: EntrepreneurPulse.com.au  Fact Sheet for Healthcare Providers: IncredibleEmployment.be  This test is not yet approved or cleared by the [place] FDA and has been authorized for detection and/or diagnosis of SARS-CoV-2 by FDA under an Emergency Use Authorization (EUA). This EUA will remain in effect (meaning this test can be used) for the duration of the COVID-19 declaration under Section 564(b)(1) of the Act, 21 U.S.C. section 360bbb-3(b)(1), unless the authorization is terminated or revoked.  Performed at [facility], [place] [address], [place], [place] [address]      Labs: Basic Metabolic Panel: Recent Labs  Lab 02/14/21 2040 02/16/21 1425 02/17/21 0419 02/18/21 1055 02/19/21 0457  NA 135 135 137 136 138  K 3.5 4.0 3.9 4.3 4.1  CL 99 100 102 102 106  CO2 27 27 27 28 27   GLUCOSE 110* 102* 108* 105* 103*  BUN 27* 41* 37* 25* 21  CREATININE 1.09 1.52* 1.51* 1.69* 1.55*  CALCIUM 9.4 9.3 9.4 9.2 9.1  MG  --  1.9  --  1.9  --   PHOS  --   --   --  3.1  --    Liver Function Tests: Recent Labs  Lab 02/14/21 2140 02/16/21 1425 02/17/21 0419 02/18/21 1055  AST 20 20 19 17   ALT 24 25 22 19   ALKPHOS 64 62 65 70  BILITOT 0.9 0.9 0.5 0.6  PROT 6.9 6.8 6.5 6.3*  ALBUMIN 3.9 3.8 3.6 3.7   Recent Labs  Lab 02/14/21 2140  LIPASE 21   No results for input(s): AMMONIA in the last 168 hours. CBC: Recent Labs  Lab 02/14/21 2040 02/16/21 1425 02/17/21 0419  02/18/21 1055  WBC 10.1 5.5 4.5 2.6*  NEUTROABS  --  3.3  --  1.1*  HGB 13.7 11.2* 10.8* 10.7*  HCT 40.9 33.8* 32.7* 33.0*  MCV 89.5 89.2 92.4 92.2  PLT 283 221 190 186        Signed:  Oswald Hillock MD.  [facility] 02/19/2021, 2:58 PM

## 2021-02-20 LAB — CULTURE, BLOOD (ROUTINE X 2)
Culture: NO GROWTH
Special Requests: ADEQUATE

## 2021-02-21 LAB — CULTURE, BLOOD (ROUTINE X 2)
Culture: NO GROWTH
Culture: NO GROWTH
Special Requests: ADEQUATE
Special Requests: ADEQUATE

## 2021-02-23 ENCOUNTER — Other Ambulatory Visit: Payer: Medicare Other

## 2021-02-24 ENCOUNTER — Other Ambulatory Visit: Payer: Self-pay

## 2021-02-24 ENCOUNTER — Inpatient Hospital Stay (HOSPITAL_BASED_OUTPATIENT_CLINIC_OR_DEPARTMENT_OTHER)
Admission: EM | Admit: 2021-02-24 | Discharge: 2021-02-27 | DRG: 187 | Disposition: A | Discharge status: Home / Self Care | Payer: Medicare Other | Attending: Internal Medicine | Admitting: Internal Medicine

## 2021-02-24 ENCOUNTER — Emergency Department (HOSPITAL_BASED_OUTPATIENT_CLINIC_OR_DEPARTMENT_OTHER): Payer: Medicare Other

## 2021-02-24 ENCOUNTER — Encounter (HOSPITAL_BASED_OUTPATIENT_CLINIC_OR_DEPARTMENT_OTHER): Payer: Self-pay | Admitting: Emergency Medicine

## 2021-02-24 DIAGNOSIS — I480 Paroxysmal atrial fibrillation: Secondary | ICD-10-CM | POA: Diagnosis not present

## 2021-02-24 DIAGNOSIS — M109 Gout, unspecified: Secondary | ICD-10-CM | POA: Diagnosis present

## 2021-02-24 DIAGNOSIS — Z9889 Other specified postprocedural states: Secondary | ICD-10-CM

## 2021-02-24 DIAGNOSIS — I130 Hypertensive heart and chronic kidney disease with heart failure and stage 1 through stage 4 chronic kidney disease, or unspecified chronic kidney disease: Secondary | ICD-10-CM | POA: Diagnosis not present

## 2021-02-24 DIAGNOSIS — Z7902 Long term (current) use of antithrombotics/antiplatelets: Secondary | ICD-10-CM

## 2021-02-24 DIAGNOSIS — C7A8 Other malignant neuroendocrine tumors: Secondary | ICD-10-CM | POA: Diagnosis present

## 2021-02-24 DIAGNOSIS — R109 Unspecified abdominal pain: Secondary | ICD-10-CM

## 2021-02-24 DIAGNOSIS — Z85828 Personal history of other malignant neoplasm of skin: Secondary | ICD-10-CM | POA: Diagnosis not present

## 2021-02-24 DIAGNOSIS — M5126 Other intervertebral disc displacement, lumbar region: Secondary | ICD-10-CM | POA: Diagnosis present

## 2021-02-24 DIAGNOSIS — I5032 Chronic diastolic (congestive) heart failure: Secondary | ICD-10-CM | POA: Diagnosis present

## 2021-02-24 DIAGNOSIS — C3491 Malignant neoplasm of unspecified part of right bronchus or lung: Secondary | ICD-10-CM

## 2021-02-24 DIAGNOSIS — N1832 Chronic kidney disease, stage 3b: Secondary | ICD-10-CM | POA: Diagnosis present

## 2021-02-24 DIAGNOSIS — E7800 Pure hypercholesterolemia, unspecified: Secondary | ICD-10-CM | POA: Diagnosis not present

## 2021-02-24 DIAGNOSIS — Z951 Presence of aortocoronary bypass graft: Secondary | ICD-10-CM | POA: Diagnosis not present

## 2021-02-24 DIAGNOSIS — Z87891 Personal history of nicotine dependence: Secondary | ICD-10-CM

## 2021-02-24 DIAGNOSIS — Z8673 Personal history of transient ischemic attack (TIA), and cerebral infarction without residual deficits: Secondary | ICD-10-CM

## 2021-02-24 DIAGNOSIS — I2510 Atherosclerotic heart disease of native coronary artery without angina pectoris: Secondary | ICD-10-CM | POA: Diagnosis present

## 2021-02-24 DIAGNOSIS — R0602 Shortness of breath: Secondary | ICD-10-CM

## 2021-02-24 DIAGNOSIS — C7A1 Malignant poorly differentiated neuroendocrine tumors: Secondary | ICD-10-CM | POA: Diagnosis present

## 2021-02-24 DIAGNOSIS — Z765 Malingerer [conscious simulation]: Secondary | ICD-10-CM | POA: Diagnosis not present

## 2021-02-24 DIAGNOSIS — Z20822 Contact with and (suspected) exposure to covid-19: Secondary | ICD-10-CM | POA: Diagnosis present

## 2021-02-24 DIAGNOSIS — Z801 Family history of malignant neoplasm of trachea, bronchus and lung: Secondary | ICD-10-CM | POA: Diagnosis not present

## 2021-02-24 DIAGNOSIS — Z823 Family history of stroke: Secondary | ICD-10-CM

## 2021-02-24 DIAGNOSIS — D709 Neutropenia, unspecified: Secondary | ICD-10-CM | POA: Diagnosis present

## 2021-02-24 DIAGNOSIS — Z8249 Family history of ischemic heart disease and other diseases of the circulatory system: Secondary | ICD-10-CM | POA: Diagnosis not present

## 2021-02-24 DIAGNOSIS — G894 Chronic pain syndrome: Secondary | ICD-10-CM | POA: Diagnosis not present

## 2021-02-24 DIAGNOSIS — R0600 Dyspnea, unspecified: Secondary | ICD-10-CM | POA: Diagnosis not present

## 2021-02-24 DIAGNOSIS — K219 Gastro-esophageal reflux disease without esophagitis: Secondary | ICD-10-CM | POA: Diagnosis present

## 2021-02-24 DIAGNOSIS — D702 Other drug-induced agranulocytosis: Secondary | ICD-10-CM | POA: Diagnosis present

## 2021-02-24 DIAGNOSIS — F32A Depression, unspecified: Secondary | ICD-10-CM | POA: Diagnosis present

## 2021-02-24 DIAGNOSIS — J90 Pleural effusion, not elsewhere classified: Principal | ICD-10-CM | POA: Diagnosis present

## 2021-02-24 DIAGNOSIS — Z91013 Allergy to seafood: Secondary | ICD-10-CM

## 2021-02-24 DIAGNOSIS — I252 Old myocardial infarction: Secondary | ICD-10-CM

## 2021-02-24 DIAGNOSIS — R6889 Other general symptoms and signs: Secondary | ICD-10-CM

## 2021-02-24 DIAGNOSIS — Z923 Personal history of irradiation: Secondary | ICD-10-CM | POA: Diagnosis not present

## 2021-02-24 DIAGNOSIS — Z9221 Personal history of antineoplastic chemotherapy: Secondary | ICD-10-CM | POA: Diagnosis not present

## 2021-02-24 DIAGNOSIS — Z7982 Long term (current) use of aspirin: Secondary | ICD-10-CM

## 2021-02-24 DIAGNOSIS — C7931 Secondary malignant neoplasm of brain: Secondary | ICD-10-CM | POA: Diagnosis not present

## 2021-02-24 DIAGNOSIS — N179 Acute kidney failure, unspecified: Secondary | ICD-10-CM | POA: Diagnosis present

## 2021-02-24 DIAGNOSIS — I255 Ischemic cardiomyopathy: Secondary | ICD-10-CM | POA: Diagnosis present

## 2021-02-24 LAB — COMPREHENSIVE METABOLIC PANEL
ALT: 21 U/L (ref 0–44)
AST: 19 U/L (ref 15–41)
Albumin: 3.8 g/dL (ref 3.5–5.0)
Alkaline Phosphatase: 69 U/L (ref 38–126)
Anion gap: 8 (ref 5–15)
BUN: 20 mg/dL (ref 8–23)
CO2: 26 mmol/L (ref 22–32)
Calcium: 9.3 mg/dL (ref 8.9–10.3)
Chloride: 103 mmol/L (ref 98–111)
Creatinine, Ser: 1.56 mg/dL — ABNORMAL HIGH (ref 0.61–1.24)
GFR, Estimated: 49 mL/min — ABNORMAL LOW (ref >60)
Glucose, Bld: 110 mg/dL — ABNORMAL HIGH (ref 70–99)
Potassium: 4.6 mmol/L (ref 3.5–5.1)
Sodium: 137 mmol/L (ref 135–145)
Total Bilirubin: 0.4 mg/dL (ref 0.3–1.2)
Total Protein: 6.8 g/dL (ref 6.5–8.1)

## 2021-02-24 LAB — CBC WITH DIFFERENTIAL/PLATELET
Abs Immature Granulocytes: 0 10*3/uL (ref 0.00–0.07)
Basophils Absolute: 0 10*3/uL (ref 0.0–0.1)
Basophils Relative: 1 %
Eosinophils Absolute: 0 10*3/uL (ref 0.0–0.5)
Eosinophils Relative: 1 %
HCT: 33.7 % — ABNORMAL LOW (ref 39.0–52.0)
Hemoglobin: 11.1 g/dL — ABNORMAL LOW (ref 13.0–17.0)
Immature Granulocytes: 0 %
Lymphocytes Relative: 70 %
Lymphs Abs: 1.1 10*3/uL (ref 0.7–4.0)
MCH: 29.8 pg (ref 26.0–34.0)
MCHC: 32.9 g/dL (ref 30.0–36.0)
MCV: 90.3 fL (ref 80.0–100.0)
Monocytes Absolute: 0.4 10*3/uL (ref 0.1–1.0)
Monocytes Relative: 23 %
Neutro Abs: 0.1 10*3/uL — CL (ref 1.7–7.7)
Neutrophils Relative %: 5 %
Platelets: 207 10*3/uL (ref 150–400)
RBC: 3.73 MIL/uL — ABNORMAL LOW (ref 4.22–5.81)
RDW: 12.5 % (ref 11.5–15.5)
WBC: 1.5 10*3/uL — ABNORMAL LOW (ref 4.0–10.5)
nRBC: 0 % (ref 0.0–0.2)

## 2021-02-24 LAB — CBC
HCT: 34.8 % — ABNORMAL LOW (ref 39.0–52.0)
Hemoglobin: 11.4 g/dL — ABNORMAL LOW (ref 13.0–17.0)
MCH: 29.6 pg (ref 26.0–34.0)
MCHC: 32.8 g/dL (ref 30.0–36.0)
MCV: 90.4 fL (ref 80.0–100.0)
Platelets: 228 10*3/uL (ref 150–400)
RBC: 3.85 MIL/uL — ABNORMAL LOW (ref 4.22–5.81)
RDW: 12.6 % (ref 11.5–15.5)
WBC: 2.5 10*3/uL — ABNORMAL LOW (ref 4.0–10.5)
nRBC: 0 % (ref 0.0–0.2)

## 2021-02-24 LAB — TROPONIN I (HIGH SENSITIVITY)
Troponin I (High Sensitivity): 3 ng/L (ref <18)
Troponin I (High Sensitivity): 3 ng/L (ref <18)

## 2021-02-24 LAB — CREATININE, SERUM
Creatinine, Ser: 1.54 mg/dL — ABNORMAL HIGH (ref 0.61–1.24)
GFR, Estimated: 50 mL/min — ABNORMAL LOW (ref >60)

## 2021-02-24 LAB — URINALYSIS, ROUTINE W REFLEX MICROSCOPIC
Bilirubin Urine: NEGATIVE
Glucose, UA: NEGATIVE mg/dL
Hgb urine dipstick: NEGATIVE
Ketones, ur: NEGATIVE mg/dL
Leukocytes,Ua: NEGATIVE
Nitrite: NEGATIVE
Protein, ur: NEGATIVE mg/dL
Specific Gravity, Urine: 1.015 (ref 1.005–1.030)
pH: 7.5 (ref 5.0–8.0)

## 2021-02-24 LAB — APTT: aPTT: 48 seconds — ABNORMAL HIGH (ref 24–36)

## 2021-02-24 LAB — LACTIC ACID, PLASMA: Lactic Acid, Venous: 1.4 mmol/L (ref 0.5–1.9)

## 2021-02-24 LAB — BRAIN NATRIURETIC PEPTIDE: B Natriuretic Peptide: 74 pg/mL (ref 0.0–100.0)

## 2021-02-24 LAB — PROTIME-INR
INR: 1.1 (ref 0.8–1.2)
Prothrombin Time: 13.7 seconds (ref 11.4–15.2)

## 2021-02-24 LAB — RESP PANEL BY RT-PCR (FLU A&B, COVID) ARPGX2
Influenza A by PCR: NEGATIVE
Influenza B by PCR: NEGATIVE
SARS Coronavirus 2 by RT PCR: NEGATIVE

## 2021-02-24 MED ORDER — SODIUM CHLORIDE 0.9% FLUSH
3.0000 mL | Freq: Two times a day (BID) | INTRAVENOUS | Status: DC
  Administered 2021-02-25 – 2021-02-27 (×3): 3 mL via INTRAVENOUS

## 2021-02-24 MED ORDER — VANCOMYCIN HCL IN DEXTROSE 1-5 GM/200ML-% IV SOLN
1000.0000 mg | INTRAVENOUS | Status: AC
  Administered 2021-02-24: 1000 mg via INTRAVENOUS
  Filled 2021-02-24 (×2): qty 200

## 2021-02-24 MED ORDER — IOHEXOL 350 MG/ML SOLN
100.0000 mL | Freq: Once | INTRAVENOUS | Status: AC | PRN
  Administered 2021-02-24: 100 mL via INTRAVENOUS

## 2021-02-24 MED ORDER — SODIUM CHLORIDE 0.9 % IV SOLN
250.0000 mL | INTRAVENOUS | Status: DC | PRN
  Administered 2021-02-27: 250 mL via INTRAVENOUS

## 2021-02-24 MED ORDER — PIPERACILLIN-TAZOBACTAM 3.375 G IVPB 30 MIN
3.3750 g | Freq: Once | INTRAVENOUS | Status: AC
  Administered 2021-02-24: 3.375 g via INTRAVENOUS
  Filled 2021-02-24: qty 50

## 2021-02-24 MED ORDER — ASPIRIN EC 81 MG PO TBEC
81.0000 mg | DELAYED_RELEASE_TABLET | Freq: Every morning | ORAL | Status: DC
  Administered 2021-02-25 – 2021-02-27 (×3): 81 mg via ORAL
  Filled 2021-02-24 (×3): qty 1

## 2021-02-24 MED ORDER — ESCITALOPRAM OXALATE 10 MG PO TABS: 10.0000 mg | ORAL_TABLET | Freq: Every day | ORAL | Status: DC

## 2021-02-24 MED ORDER — CLOPIDOGREL BISULFATE 75 MG PO TABS
75.0000 mg | ORAL_TABLET | Freq: Every day | ORAL | Status: DC
  Administered 2021-02-24 – 2021-02-27 (×4): 75 mg via ORAL
  Filled 2021-02-24 (×4): qty 1

## 2021-02-24 MED ORDER — IPRATROPIUM-ALBUTEROL 0.5-2.5 (3) MG/3ML IN SOLN
3.0000 mL | Freq: Three times a day (TID) | RESPIRATORY_TRACT | Status: DC
  Administered 2021-02-25 – 2021-02-27 (×7): 3 mL via RESPIRATORY_TRACT
  Filled 2021-02-24 (×7): qty 3

## 2021-02-24 MED ORDER — AMIODARONE HCL 200 MG PO TABS
200.0000 mg | ORAL_TABLET | Freq: Every day | ORAL | Status: DC
  Administered 2021-02-24 – 2021-02-27 (×4): 200 mg via ORAL
  Filled 2021-02-24 (×4): qty 1

## 2021-02-24 MED ORDER — IPRATROPIUM-ALBUTEROL 0.5-2.5 (3) MG/3ML IN SOLN
3.0000 mL | Freq: Four times a day (QID) | RESPIRATORY_TRACT | Status: DC
  Administered 2021-02-24: 3 mL via RESPIRATORY_TRACT
  Filled 2021-02-24: qty 3

## 2021-02-24 MED ORDER — METOPROLOL SUCCINATE ER 25 MG PO TB24
25.0000 mg | ORAL_TABLET | Freq: Every day | ORAL | Status: DC
  Administered 2021-02-24 – 2021-02-27 (×4): 25 mg via ORAL
  Filled 2021-02-24 (×4): qty 1

## 2021-02-24 MED ORDER — ENOXAPARIN SODIUM 40 MG/0.4ML IJ SOSY
40.0000 mg | PREFILLED_SYRINGE | INTRAMUSCULAR | Status: DC
  Administered 2021-02-24 – 2021-02-26 (×3): 40 mg via SUBCUTANEOUS
  Filled 2021-02-24 (×3): qty 0.4

## 2021-02-24 MED ORDER — ENSURE ENLIVE PO LIQD
237.0000 mL | Freq: Two times a day (BID) | ORAL | Status: DC
  Administered 2021-02-25 – 2021-02-26 (×3): 237 mL via ORAL

## 2021-02-24 MED ORDER — ACETAMINOPHEN 650 MG RE SUPP: 650.0000 mg | Freq: Four times a day (QID) | RECTAL | Status: DC | PRN

## 2021-02-24 MED ORDER — VANCOMYCIN HCL IN DEXTROSE 1-5 GM/200ML-% IV SOLN
1000.0000 mg | Freq: Once | INTRAVENOUS | Status: AC
  Administered 2021-02-24: 1000 mg via INTRAVENOUS
  Filled 2021-02-24: qty 200

## 2021-02-24 MED ORDER — VANCOMYCIN HCL 1250 MG/250ML IV SOLN
1250.0000 mg | INTRAVENOUS | Status: DC
  Administered 2021-02-25 – 2021-02-26 (×2): 1250 mg via INTRAVENOUS
  Filled 2021-02-24 (×3): qty 250

## 2021-02-24 MED ORDER — RANOLAZINE ER 500 MG PO TB12
1000.0000 mg | ORAL_TABLET | Freq: Two times a day (BID) | ORAL | Status: DC
  Administered 2021-02-24 – 2021-02-27 (×5): 1000 mg via ORAL
  Filled 2021-02-24 (×6): qty 2

## 2021-02-24 MED ORDER — ONDANSETRON HCL 4 MG/2ML IJ SOLN
4.0000 mg | Freq: Once | INTRAMUSCULAR | Status: AC
  Administered 2021-02-24: 4 mg via INTRAVENOUS
  Filled 2021-02-24: qty 2

## 2021-02-24 MED ORDER — ONDANSETRON HCL 4 MG/2ML IJ SOLN
4.0000 mg | Freq: Four times a day (QID) | INTRAMUSCULAR | Status: DC | PRN
  Administered 2021-02-25 (×2): 4 mg via INTRAVENOUS
  Filled 2021-02-24 (×2): qty 2

## 2021-02-24 MED ORDER — ALPRAZOLAM 0.5 MG PO TABS
0.5000 mg | ORAL_TABLET | Freq: Every evening | ORAL | Status: DC | PRN
  Administered 2021-02-25 – 2021-02-27 (×3): 0.5 mg via ORAL
  Filled 2021-02-24 (×3): qty 1

## 2021-02-24 MED ORDER — AMLODIPINE BESYLATE 10 MG PO TABS
10.0000 mg | ORAL_TABLET | Freq: Every day | ORAL | Status: DC
  Administered 2021-02-24 – 2021-02-27 (×4): 10 mg via ORAL
  Filled 2021-02-24 (×4): qty 1

## 2021-02-24 MED ORDER — HYDROMORPHONE HCL 1 MG/ML IJ SOLN
1.0000 mg | Freq: Once | INTRAMUSCULAR | Status: AC
Start: 2021-02-24 — End: 2021-02-24
  Administered 2021-02-24: 1 mg via INTRAVENOUS
  Filled 2021-02-24: qty 1

## 2021-02-24 MED ORDER — ACETAMINOPHEN 325 MG PO TABS: 650.0000 mg | ORAL_TABLET | Freq: Four times a day (QID) | ORAL | Status: DC | PRN

## 2021-02-24 MED ORDER — ONDANSETRON HCL 4 MG PO TABS
4.0000 mg | ORAL_TABLET | Freq: Four times a day (QID) | ORAL | Status: DC | PRN
  Administered 2021-02-24: 4 mg via ORAL
  Filled 2021-02-24: qty 1

## 2021-02-24 MED ORDER — IPRATROPIUM-ALBUTEROL 0.5-2.5 (3) MG/3ML IN SOLN
3.0000 mL | Freq: Four times a day (QID) | RESPIRATORY_TRACT | Status: DC | PRN
  Administered 2021-02-27: 3 mL via RESPIRATORY_TRACT
  Filled 2021-02-24: qty 3

## 2021-02-24 MED ORDER — ATORVASTATIN CALCIUM 40 MG PO TABS
80.0000 mg | ORAL_TABLET | Freq: Every day | ORAL | Status: DC
  Administered 2021-02-24 – 2021-02-27 (×4): 80 mg via ORAL
  Filled 2021-02-24 (×4): qty 2

## 2021-02-24 MED ORDER — METHOCARBAMOL 1000 MG/10ML IJ SOLN
500.0000 mg | Freq: Four times a day (QID) | INTRAVENOUS | Status: DC | PRN
  Administered 2021-02-26 – 2021-02-27 (×5): 500 mg via INTRAVENOUS
  Filled 2021-02-24: qty 5
  Filled 2021-02-24 (×3): qty 500
  Filled 2021-02-24: qty 5
  Filled 2021-02-24: qty 500

## 2021-02-24 MED ORDER — SODIUM CHLORIDE 0.9% FLUSH: 3.0000 mL | INTRAVENOUS | Status: DC | PRN

## 2021-02-24 MED ORDER — PIPERACILLIN-TAZOBACTAM 3.375 G IVPB
3.3750 g | Freq: Three times a day (TID) | INTRAVENOUS | Status: DC
  Administered 2021-02-24 – 2021-02-25 (×2): 3.375 g via INTRAVENOUS
  Filled 2021-02-24 (×3): qty 50

## 2021-02-24 MED ORDER — OXYCODONE-ACETAMINOPHEN 5-325 MG PO TABS
1.0000 | ORAL_TABLET | ORAL | Status: DC | PRN
  Administered 2021-02-24 – 2021-02-25 (×3): 1 via ORAL
  Filled 2021-02-24 (×3): qty 1

## 2021-02-24 NOTE — H&P (Signed)
TRH H&P    Patient Demographics:    [name], is a 65 y.o. male  MRN: [id number]  DOB - 1955-11-05  Admit Date - 02/24/2021  Referring MD/NP/PA: Transfer from [place] Primary MD for the patient is Anthony Melter, MD  Patient coming from: [place]  Chief complaint-dyspnea on exertion   HPI:    [name]  is a 66 y.o. 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  During hospitalization patient had low back pain, MRI of lumbar and thoracic spine showed degenerative disc disease and thoracic spine as well as disc protrusion at L3-L4, he saw [name] as outpatient after discharge.  Patient states that he was doing fine, this morning he noted that he was getting dyspneic on exertion when he took trash out this morning.  Patient felt extremely tired.  He went to bed [employer].  Patient had positive blood culture with 1 set of blood culture grew staph capitis.  At that time infectious disease recommended no antibiotics as it was felt to be a contaminant.  Repeat blood cultures drawn on 02/16/2021 were negative.  Patient today complains of shortness of breath, he is not requiring oxygen, not hypoxemic.  Patient is on chemotherapy for neuroendocrine lung cancer, WBC is down to 1.5.  ED provider discussed with oncologist and it was felt that patient can be placed under observation for IV antibiotics and other set of blood cultures were obtained.  Patient did not  meet SIRS criteria for sepsis.  He denies chest pain Denies abdominal pain Denies dysuria    Review of systems:    In addition to the HPI above,    All other systems reviewed and are negative.    Past History of the following :    Past Medical History:  Diagnosis Date   Anxiety    Arthritis    Basal cell carcinoma (BCC) of forehead    CAD (coronary artery disease)    a. 10/2015 ant STEMI >> LHC with 3 v CAD; oLAD tx with POBA >> emergent CABG. b. Multiple evals since that time, early graft failure of SVG-RCA by cath 03/2016. c. 2/19 PCI/DES x1 to pRCA, normal EF.   Carotid artery disease ([place])    a. 40-59% BICA 02/2018.   Depression    Dyspnea    Ectopic atrial tachycardia (HCC)    Esophageal reflux    eosinophil esophagitis   Family history of adverse reaction to anesthesia    "sister has PONV" (06/21/2017)   Former tobacco use    Gout    Hepatitis C    "treated and cured" (06/21/2017)   High  cholesterol    History of kidney stones    Hypertension    Ischemic cardiomyopathy    a. EF 25-30% at intraop TEE 4/17  //  b. Limited Echo 5/17 - EF 45-50%, mild ant HK. c. EF 55-65% by cath 09/2017.   Migraine    "3-4/yr" (06/21/2017)   Myocardial infarction ([place]) 10/2015   Palpitations    Sinus bradycardia    a. HR dropping into 40s in 02/2016 -> BB reduced.   Stroke [facility]) 10/2016   "small one; sometimes my memory/cognitive issues" (06/21/2017)   Symptomatic hypotension    a. 02/2016 ER visit -> meds reduced.   Syncope    Wears dentures    Wears glasses       Past Surgical History:  Procedure Laterality Date   25 GAUGE PARS PLANA VITRECTOMY WITH 20 GAUGE MVR PORT  12/31/2020   Procedure: 25 GAUGE PARS PLANA VITRECTOMY WITH 20 GAUGE MVR PORT;  Surgeon: [name], MD;  Location: [place];  Service: Thoracic;;   ANTERIOR CERVICAL DECOMP/DISCECTOMY FUSION N/A 10/17/2018   Procedure: Anterior Cervical Decompression Fusion - Cervical seven -Thoracic one;  Surgeon:  Consuella Lose, MD;  Location: [place];  Service: Neurosurgery;  Laterality: N/A;   BASAL CELL CARCINOMA EXCISION     "forehead   BIOPSY  07/20/2019   Procedure: BIOPSY;  Surgeon: Carol Ada, MD;  [facility];  Service: Endoscopy;;   BRONCHIAL BIOPSY  12/24/2020   Procedure: BRONCHIAL BIOPSIES;  Surgeon: Garner Nash, DO;  Location: [place] [facility];  Service: Pulmonary;;   BRONCHIAL BRUSHINGS  12/24/2020   Procedure: BRONCHIAL BRUSHINGS;  Surgeon: Garner Nash, DO;  Location: [place];  Service: Pulmonary;;   BRONCHIAL NEEDLE ASPIRATION BIOPSY  12/24/2020   Procedure: BRONCHIAL NEEDLE ASPIRATION BIOPSIES;  Surgeon: Garner Nash, DO;  Location: [place];  Service: Pulmonary;;   CARDIAC CATHETERIZATION N/A 11/28/2015   Procedure: Left Heart Cath and Coronary Angiography;  Surgeon: [name], MD;  Location: [place] [facility];  Service: Cardiovascular;  Laterality: N/A;   CARDIAC CATHETERIZATION N/A 11/28/2015   Procedure: Coronary Balloon Angioplasty;  Surgeon: [name], MD;  Location: [place] [facility];  Service: Cardiovascular;  Laterality: N/A;  ostial LAD   CARDIAC CATHETERIZATION N/A 11/28/2015   Procedure: Coronary/Graft Angiography;  Surgeon: [name], MD;  Location: [place] [facility];  Service: Cardiovascular;  Laterality: N/A;  coronaries only    CARDIAC CATHETERIZATION N/A 04/21/2016   Procedure: Left Heart Cath and Coronary Angiography;  Surgeon: Wellington Hampshire, MD;  Location: [place] [facility];  Service: Cardiovascular;  Laterality: N/A;   CARDIAC CATHETERIZATION N/A 06/14/2016   Procedure: Left Heart Cath and Cors/Grafts Angiography;  Surgeon: Lorretta Harp, MD;  Location: [place] [facility];  Service: Cardiovascular;  Laterality: N/A;   CARDIAC CATHETERIZATION N/A 09/08/2016   Procedure: Left Heart Cath and Cors/Grafts Angiography;  Surgeon: Wellington Hampshire, MD;  Location: [place] [facility];  Service:  Cardiovascular;  Laterality: N/A;   CARDIAC CATHETERIZATION     CORONARY ARTERY BYPASS GRAFT N/A 11/28/2015   Procedure: CORONARY ARTERY BYPASS GRAFTING (CABG) TIMES FIVE USING LEFT INTERNAL MAMMARY ARTERY AND RIGHT GREATER SAPHENOUS,VIEN HARVEATED BY ENDOVIEN, INTRAOPPRATIVE TEE;  Surgeon: Gaye Pollack, MD;  Location: [place];  Service: Open Heart Surgery;  Laterality: N/A;   CORONARY STENT INTERVENTION N/A 10/05/2017   Procedure: CORONARY STENT INTERVENTION;  Surgeon: [name], MD;  Location: [place] [facility];  Service: Cardiovascular;  Laterality: N/A;   ESOPHAGOGASTRODUODENOSCOPY (EGD) WITH PROPOFOL N/A 07/20/2019   Procedure: ESOPHAGOGASTRODUODENOSCOPY (EGD) WITH PROPOFOL;  Surgeon: Carol Ada, MD;  [facility];  Service: Endoscopy;  Laterality: N/A;   ESOPHAGOGASTRODUODENOSCOPY (EGD) WITH PROPOFOL N/A 01/30/2021   Procedure: ESOPHAGOGASTRODUODENOSCOPY (EGD) WITH PROPOFOL;  Surgeon: Carol Ada, MD;  [facility];  Service: Endoscopy;  Laterality: N/A;   FIDUCIAL MARKER PLACEMENT  12/24/2020   Procedure: FIDUCIAL DYE MARKER PLACEMENT;  Surgeon: Garner Nash, DO;  Location: [place] [facility];  Service: Pulmonary;;   HUMERUS SURGERY Right 1969   "tumor inside bone; filled it w/bone chips"   INTERCOSTAL NERVE BLOCK Right 12/24/2020   Procedure: INTERCOSTAL NERVE BLOCK;  Surgeon: [name], MD;  Location: [employer];  Service: Thoracic;  Laterality: Right;   IR IMAGING GUIDED PORT INSERTION  02/06/2021   LEFT HEART CATH AND CORS/GRAFTS ANGIOGRAPHY N/A 03/11/2017   Procedure: Left Heart Cath and Cors/Grafts Angiography;  Surgeon: Leonie Man, MD;  Location: [place] [facility];  Service: Cardiovascular;  Laterality: N/A;   LEFT HEART CATH AND CORS/GRAFTS ANGIOGRAPHY N/A 10/05/2017   Procedure: LEFT HEART CATH AND CORS/GRAFTS ANGIOGRAPHY;  Surgeon: [name], MD;  Location: [place] [facility];  Service: Cardiovascular;  Laterality: N/A;   LEFT  HEART CATH AND CORS/GRAFTS ANGIOGRAPHY N/A 04/11/2019   Procedure: LEFT HEART CATH AND CORS/GRAFTS ANGIOGRAPHY;  Surgeon: [name], MD;  Location: [place] [facility];  Service: Cardiovascular;  Laterality: N/A;   NODE DISSECTION Right 12/24/2020   Procedure: NODE DISSECTION;  Surgeon: [name], MD;  Location: [place];  Service: Thoracic;  Laterality: Right;   PERIPHERAL VASCULAR CATHETERIZATION N/A 06/14/2016   Procedure: Lower Extremity Angiography;  Surgeon: Lorretta Harp, MD;  Location: [place] [facility];  Service: Cardiovascular;  Laterality: N/A;   VIDEO BRONCHOSCOPY WITH ENDOBRONCHIAL NAVIGATION Right 12/24/2020   Procedure: VIDEO BRONCHOSCOPY WITH ENDOBRONCHIAL NAVIGATION;  Surgeon: Garner Nash, DO;  Location: [place];  Service: Pulmonary;  Laterality: Right;   VIDEO BRONCHOSCOPY WITH ENDOBRONCHIAL ULTRASOUND N/A 12/24/2020   Procedure: VIDEO BRONCHOSCOPY WITH ENDOBRONCHIAL ULTRASOUND;  Surgeon: Garner Nash, DO;  Location: [place];  Service: Pulmonary;  Laterality: N/A;   VIDEO BRONCHOSCOPY WITH INSERTION OF INTERBRONCHIAL VALVE (IBV) N/A 12/31/2020   Procedure: VIDEO BRONCHOSCOPY WITH INSERTION OF INTERBRONCHIAL VALVE (IBV).VALVE IN CARTRIDGE 65mm,9mm. CHEST TUBE PLACEMENT.;  Surgeon: [name], MD;  [facility];  Service: Thoracic;  Laterality: N/A;      Social History:      Social History   Tobacco Use   Smoking status: Former    Packs/day: 0.75    Years: 44.00    Pack years: 33.00    Types: Cigarettes    Quit date: 11/28/2015    Years since quitting: 5.2   Smokeless tobacco: Never  Substance Use Topics   Alcohol use: Yes    Comment: occ       Family History :     Family History  Problem Relation Age of Onset   Lung cancer Mother    Heart Problems Father    Heart attack Father 11   Stroke Father    Heart failure Father    Heart attack Maternal Grandmother    Stroke Maternal Grandmother    Heart attack  Paternal Uncle    Hypertension Brother    Autoimmune disease Neg Hx       Home Medications:   Prior to Admission medications  Medication Sig Start Date End Date Taking? Authorizing Provider  albuterol (VENTOLIN HFA) 108 (90 Base) MCG/ACT inhaler Inhale 2 puffs into the lungs every 6 (six) hours as needed for wheezing or shortness of breath. 01/12/21  Yes Icard, Octavio Graves, DO  amiodarone (PACERONE) 200 MG tablet Take 1 tablet (200 mg total) by mouth daily. 01/07/21  Yes Conte, Tessa N, PA-C  amLODipine (NORVASC) 10 MG tablet TAKE 1 TABLET(10 MG) BY MOUTH DAILY Patient taking differently: Take 10 mg by mouth daily. 12/02/20  Yes [name], MD  aspirin EC 81 MG tablet Take 81 mg by mouth daily. Swallow whole.   Yes [provider]  atorvastatin (LIPITOR) 80 MG tablet TAKE 1 TABLET(80 MG) BY MOUTH DAILY Patient taking differently: Take 80 mg by mouth daily. 12/10/19  Yes [name], MD  clopidogrel (PLAVIX) 75 MG tablet TAKE 1 TABLET BY MOUTH EVERY DAY Patient taking differently: Take 75 mg by mouth daily. 02/06/20  Yes [name], MD  fluticasone [facility]) 50 MCG/ACT nasal spray Place 2 sprays into both nostrils daily. Patient taking differently: Place 2 sprays into both nostrils daily as needed for allergies. 01/12/21  Yes Icard, Bradley L, DO  lidocaine-prilocaine (EMLA) cream Apply 1 application topically as needed. Patient taking differently: Apply 1 application topically once as needed (port access). 01/30/21  Yes Heilingoetter, Cassandra L, PA-C  metoprolol succinate (TOPROL-XL) 25 MG 24 hr tablet Take 1 tablet (25 mg total) by mouth daily. 08/27/20  Yes [name], MD  Multiple Vitamin (MULTIVITAMIN WITH MINERALS) TABS tablet Take 1 tablet by mouth in the morning. Centrum   Yes [provider]  nitroGLYCERIN (NITROSTAT) 0.4 MG SL tablet PLACE 1 TABLET UNDER THE TONGUE EVERY 5 MINUTES AS NEEDED FOR CHEST PAIN. 3 DOSES MAX Patient taking  differently: Place 0.4 mg under the tongue every 5 (five) minutes as needed for chest pain. 04/16/20  Yes [name], MD  ondansetron (ZOFRAN) 8 MG tablet Take 1 tablet (8 mg total) by mouth every 8 (eight) hours as needed for nausea or vomiting. Starting 3 days after chemotherapy 02/13/21  Yes Heilingoetter, Cassandra L, PA-C  oxyCODONE-acetaminophen (PERCOCET/ROXICET) 5-325 MG tablet Take 1 tablet by mouth every 4 (four) hours as needed for severe pain. 02/19/21  Yes Oswald Hillock, MD  prochlorperazine (COMPAZINE) 10 MG tablet Take 1 tablet (10 mg total) by mouth every 6 (six) hours as needed. Patient taking differently: Take 10 mg by mouth every 6 (six) hours as needed for nausea or vomiting. 01/14/21  Yes Heilingoetter, Cassandra L, PA-C  ranolazine (RANEXA) 1000 MG SR tablet Take 1 tablet (1,000 mg total) by mouth 2 (two) times daily. 11/08/19  Yes [name], NP  Tiotropium Bromide-Olodaterol (STIOLTO RESPIMAT) 2.5-2.5 MCG/ACT AERS Inhale 2 puffs into the lungs daily. 01/12/21  Yes Icard, Bradley L, DO  escitalopram (LEXAPRO) 10 MG tablet Take 10 mg by mouth daily. Patient not taking: Reported on 02/24/2021    [provider]     Allergies:     Allergies  Allergen Reactions   Prednisone Other (See Comments)    States that this med makes him "crazy"   Tetanus Toxoids Swelling and Other (See Comments)    Fever, Swelling of the arm    Wellbutrin [Bupropion] Other (See Comments)    Crazy thoughts, nightmares   Indomethacin Other (See Comments)    rectal bleeding   Other     Other reaction(s): Unknown   Scallops [Shellfish Allergy]  Nausea Only   Varenicline Other (See Comments)    Dreams Other reaction(s): Unknown Other reaction(s): Unknown     Physical Exam:   Vitals  Blood pressure 123/73, pulse 63, temperature 98.6 F (37 C), temperature source Oral, resp. rate 20, height 5\' 9"  (1.753 m), weight 83.9 kg, SpO2 98 %.  1.  General: Appears in no acute  distress  2. Psychiatric: Alert, oriented x3, intact insight and judgment  3. Neurologic: Cranial nerves II through XII grossly intact, motor strength 5/5 in all extremities, sensations intact bilaterally  4. HEENMT:  Atraumatic normocephalic, extraocular muscle intact  5. Respiratory : Scattered wheezing bilaterally  6. Cardiovascular : S1-S2, regular, no murmur auscultated, trace edema in the lower extremities  7. Gastrointestinal:  Abdominal soft, nontender, no organomegaly  8. Skin:  No rashes       Data Review:    CBC Recent Labs  Lab 02/18/21 1055 02/24/21 0821  WBC 2.6* 1.5*  HGB 10.7* 11.1*  HCT 33.0* 33.7*  PLT 186 207  MCV 92.2 90.3  MCH 29.9 29.8  MCHC 32.4 32.9  RDW 11.9 12.5  LYMPHSABS 1.3 1.1  MONOABS 0.1 0.4  EOSABS 0.1 0.0  BASOSABS 0.0 0.0   ------------------------------------------------------------------------------------------------------------------  Results for orders placed or performed during the hospital encounter of 02/24/21 (from the past 48 hour(s))  Troponin I (High Sensitivity)     Status: None   Collection Time: 02/24/21  8:21 AM  Result Value Ref Range   Troponin I (High Sensitivity) 3 <18 ng/L    Comment: (NOTE) Elevated high sensitivity troponin I (hsTnI) values and significant  changes across serial measurements may suggest ACS but many other  chronic and acute conditions are known to elevate hsTnI results.  Refer to the "Links" section for chest pain algorithms and additional  guidance. Performed at [facility], [address] [place], [place] [address]   Brain natriuretic peptide     Status: None   Collection Time: 02/24/21  8:21 AM  Result Value Ref Range   B Natriuretic Peptide 74.0 0.0 - 100.0 pg/mL    Comment: Performed at [facility], [address] [place], [place] [address]  Lactic acid, plasma     Status: None   Collection Time: 02/24/21  8:21 AM  Result Value Ref Range    Lactic Acid, Venous 1.4 0.5 - 1.9 mmol/L    Comment: Performed at [facility], [address] [place], [place] [address]  Comprehensive metabolic panel     Status: Abnormal   Collection Time: 02/24/21  8:21 AM  Result Value Ref Range   Sodium 137 135 - 145 mmol/L   Potassium 4.6 3.5 - 5.1 mmol/L   Chloride 103 98 - 111 mmol/L   CO2 26 22 - 32 mmol/L   Glucose, Bld 110 (H) 70 - 99 mg/dL    Comment: Glucose reference range applies only to samples taken after fasting for at least 8 hours.   BUN 20 8 - 23 mg/dL   Creatinine, Ser 1.56 (H) 0.61 - 1.24 mg/dL   Calcium 9.3 8.9 - 10.3 mg/dL   Total Protein 6.8 6.5 - 8.1 g/dL   Albumin 3.8 3.5 - 5.0 g/dL   AST 19 15 - 41 U/L   ALT 21 0 - 44 U/L   Alkaline Phosphatase 69 38 - 126 U/L   Total Bilirubin 0.4 0.3 - 1.2 mg/dL   GFR, Estimated 49 (L) >60 mL/min  Comment: (NOTE) Calculated using the CKD-EPI Creatinine Equation (2021)    Anion gap 8 5 - 15    Comment: Performed at [facility], [address] [place], [place] [address]  CBC WITH DIFFERENTIAL     Status: Abnormal   Collection Time: 02/24/21  8:21 AM  Result Value Ref Range   WBC 1.5 (L) 4.0 - 10.5 K/uL    Comment: REPEATED TO VERIFY   RBC 3.73 (L) 4.22 - 5.81 MIL/uL   Hemoglobin 11.1 (L) 13.0 - 17.0 g/dL   HCT 33.7 (L) 39.0 - 52.0 %   MCV 90.3 80.0 - 100.0 fL   MCH 29.8 26.0 - 34.0 pg   MCHC 32.9 30.0 - 36.0 g/dL   RDW 12.5 11.5 - 15.5 %   Platelets 207 150 - 400 K/uL   nRBC 0.0 0.0 - 0.2 %   Neutrophils Relative % 5 %   Neutro Abs 0.1 (LL) 1.7 - 7.7 K/uL    Comment: REPEATED TO VERIFY CRITICAL RESULT CALLED TO, READ BACK BY AND VERIFIED WITH: SAM COBLE,RN AT 0954 BY M OLSON    Lymphocytes Relative 70 %   Lymphs Abs 1.1 0.7 - 4.0 K/uL   Monocytes Relative 23 %   Monocytes Absolute 0.4 0.1 - 1.0 K/uL   Eosinophils Relative 1 %   Eosinophils Absolute 0.0 0.0 - 0.5 K/uL   Basophils Relative 1 %   Basophils Absolute 0.0 0.0 - 0.1 K/uL    Immature Granulocytes 0 %   Abs Immature Granulocytes 0.00 0.00 - 0.07 K/uL   Spherocytes PRESENT    Giant PLTs PRESENT     Comment: Performed at [facility], [address] [place], [place] [address]  Urinalysis, Routine w reflex microscopic     Status: None   Collection Time: 02/24/21  8:21 AM  Result Value Ref Range   Color, Urine YELLOW YELLOW   APPearance CLEAR CLEAR   Specific Gravity, Urine 1.015 1.005 - 1.030   pH 7.5 5.0 - 8.0   Glucose, UA NEGATIVE NEGATIVE mg/dL   Hgb urine dipstick NEGATIVE NEGATIVE   Bilirubin Urine NEGATIVE NEGATIVE   Ketones, ur NEGATIVE NEGATIVE mg/dL   Protein, ur NEGATIVE NEGATIVE mg/dL   Nitrite NEGATIVE NEGATIVE   Leukocytes,Ua NEGATIVE NEGATIVE    Comment: Microscopic not done on urines with negative protein, blood, leukocytes, nitrite, or glucose < 500 mg/dL. Performed at [facility], [address] [place], [place] [address]   Resp Panel by RT-PCR (Flu A&B, Covid) Nasopharyngeal Swab     Status: None   Collection Time: 02/24/21  8:53 AM   Specimen: Nasopharyngeal Swab; Nasopharyngeal(NP) swabs in vial transport medium  Result Value Ref Range   SARS Coronavirus 2 by RT PCR NEGATIVE NEGATIVE    Comment: (NOTE) SARS-CoV-2 target nucleic acids are NOT DETECTED.  The SARS-CoV-2 RNA is generally detectable in upper respiratory specimens during the acute phase of infection. The lowest concentration of SARS-CoV-2 viral copies this assay can detect is 138 copies/mL. A negative result does not preclude SARS-Cov-2 infection and should not be used as the sole basis for treatment or other patient management decisions. A negative result may occur with  improper specimen collection/handling, submission of specimen other than nasopharyngeal swab, presence of viral mutation(s) within the areas targeted by this assay, and inadequate number of viral copies(<138 copies/mL). A negative result must be combined with clinical  observations, patient history, and epidemiological information. The expected result is  Negative.  Fact Sheet for Patients:  EntrepreneurPulse.com.au  Fact Sheet for Healthcare Providers:  IncredibleEmployment.be  This test is no t yet approved or cleared by the [place] FDA and  has been authorized for detection and/or diagnosis of SARS-CoV-2 by FDA under an Emergency Use Authorization (EUA). This EUA will remain  in effect (meaning this test can be used) for the duration of the COVID-19 declaration under Section 564(b)(1) of the Act, 21 U.S.C.section 360bbb-3(b)(1), unless the authorization is terminated  or revoked sooner.       Influenza A by PCR NEGATIVE NEGATIVE   Influenza B by PCR NEGATIVE NEGATIVE    Comment: (NOTE) The Xpert Xpress SARS-CoV-2/FLU/RSV plus assay is intended as an aid in the diagnosis of influenza from Nasopharyngeal swab specimens and should not be used as a sole basis for treatment. Nasal washings and aspirates are unacceptable for Xpert Xpress SARS-CoV-2/FLU/RSV testing.  Fact Sheet for Patients: EntrepreneurPulse.com.au  Fact Sheet for Healthcare Providers: IncredibleEmployment.be  This test is not yet approved or cleared by the [place] FDA and has been authorized for detection and/or diagnosis of SARS-CoV-2 by FDA under an Emergency Use Authorization (EUA). This EUA will remain in effect (meaning this test can be used) for the duration of the COVID-19 declaration under Section 564(b)(1) of the Act, 21 U.S.C. section 360bbb-3(b)(1), unless the authorization is terminated or revoked.  Performed at [facility], [address] [place], [place] [address]   Protime-INR     Status: None   Collection Time: 02/24/21  9:17 AM  Result Value Ref Range   Prothrombin Time 13.7 11.4 - 15.2 seconds   INR 1.1 0.8 - 1.2    Comment: (NOTE) INR goal varies based on  device and disease states. Performed at [facility], [address] [place], [place] [address]   APTT     Status: Abnormal   Collection Time: 02/24/21  9:17 AM  Result Value Ref Range   aPTT 48 (H) 24 - 36 seconds    Comment:        IF BASELINE aPTT IS ELEVATED, SUGGEST PATIENT RISK ASSESSMENT BE USED TO DETERMINE APPROPRIATE ANTICOAGULANT THERAPY. Performed at [facility], [address] [place], [place] [address]   Troponin I (High Sensitivity)     Status: None   Collection Time: 02/24/21 11:19 AM  Result Value Ref Range   Troponin I (High Sensitivity) 3 <18 ng/L    Comment: (NOTE) Elevated high sensitivity troponin I (hsTnI) values and significant  changes across serial measurements may suggest ACS but many other  chronic and acute conditions are known to elevate hsTnI results.  Refer to the "Links" section for chest pain algorithms and additional  guidance. Performed at [facility], [address] [place], [place] [address]     Chemistries  Recent Labs  Lab 02/18/21 1055 02/19/21 0457 02/24/21 0821  NA 136 138 137  K 4.3 4.1 4.6  CL 102 106 103  CO2 28 27 26   GLUCOSE 105* 103* 110*  BUN 25* 21 20  CREATININE 1.69* 1.55* 1.56*  CALCIUM 9.2 9.1 9.3  MG 1.9  --   --   AST 17  --  19  ALT 19  --  21  ALKPHOS 70  --  69  BILITOT 0.6  --  0.4   ------------------------------------------------------------------------------------------------------------------  ------------------------------------------------------------------------------------------------------------------ GFR: Estimated Creatinine Clearance: 47.2 mL/min (A) (by C-G formula based on SCr of 1.56  mg/dL (H)). Liver Function Tests: Recent Labs  Lab 02/18/21 1055 02/24/21 0821  AST 17 19  ALT 19 21  ALKPHOS 70 69  BILITOT 0.6 0.4  PROT 6.3* 6.8  ALBUMIN 3.7 3.8   No results for input(s): LIPASE, AMYLASE in the last 168 hours. No results for input(s):  AMMONIA in the last 168 hours. Coagulation Profile: Recent Labs  Lab 02/24/21 0917  INR 1.1   Cardiac Enzymes: No results for input(s): CKTOTAL, CKMB, CKMBINDEX, TROPONINI in the last 168 hours. BNP (last 3 results) No results for input(s): PROBNP in the last 8760 hours. HbA1C: No results for input(s): HGBA1C in the last 72 hours. CBG: No results for input(s): GLUCAP in the last 168 hours. Lipid Profile: No results for input(s): CHOL, HDL, LDLCALC, TRIG, CHOLHDL, LDLDIRECT in the last 72 hours. Thyroid Function Tests: No results for input(s): TSH, T4TOTAL, FREET4, T3FREE, THYROIDAB in the last 72 hours. Anemia Panel: No results for input(s): VITAMINB12, FOLATE, FERRITIN, TIBC, IRON, RETICCTPCT in the last 72 hours.  --------------------------------------------------------------------------------------------------------------- Urine analysis:    Component Value Date/Time   COLORURINE YELLOW 02/24/2021 0821   APPEARANCEUR CLEAR 02/24/2021 0821   LABSPEC 1.015 02/24/2021 0821   PHURINE 7.5 02/24/2021 0821   GLUCOSEU NEGATIVE 02/24/2021 0821   HGBUR NEGATIVE 02/24/2021 0821   BILIRUBINUR NEGATIVE 02/24/2021 0821   KETONESUR NEGATIVE 02/24/2021 0821   PROTEINUR NEGATIVE 02/24/2021 0821   UROBILINOGEN 0.2 05/13/2014 0658   NITRITE NEGATIVE 02/24/2021 0821   LEUKOCYTESUR NEGATIVE 02/24/2021 2353      Imaging Results:    CT Angio Chest PE W and/or Wo Contrast  Result Date: 02/24/2021 CLINICAL DATA:  Lung cancer, chemotherapy, shortness of breath EXAM: CT ANGIOGRAPHY CHEST WITH CONTRAST TECHNIQUE: Multidetector CT imaging of the chest was performed using the standard protocol during bolus administration of intravenous contrast. Multiplanar CT image reconstructions and MIPs were obtained to evaluate the vascular anatomy. CONTRAST:  183mL OMNIPAQUE IOHEXOL 350 MG/ML SOLN COMPARISON:  02/14/2021 FINDINGS: Cardiovascular: Satisfactory opacification of the pulmonary arteries to the  segmental level. No evidence of pulmonary embolism. Normal heart size. No pericardial effusion. Coronary artery calcification. Post CABG. Thoracic aorta is stable in caliber measuring dilated at 4 cm along the ascending portion. Mediastinum/Nodes: No new enlarged lymph nodes. Lungs/Pleura: Increased, small to moderate right pleural effusion with adjacent atelectasis. Post right upper lobectomy. Upper Abdomen: No acute abnormality. Musculoskeletal: No acute osseous abnormality. Review of the MIP images confirms the above findings. IMPRESSION: No acute pulmonary embolism. Increased, small to moderate right pleural effusion. Electronically Signed   By: Macy Mis M.D.   On: 02/24/2021 10:12   DG Chest Port 1 View  Result Date: 02/24/2021 CLINICAL DATA:  Chest pain and shortness of breath. History of lung cancer. Question sepsis. EXAM: PORTABLE CHEST 1 VIEW COMPARISON:  01/07/2021 FINDINGS: Power port placed from a right internal jugular approach has its tip at the SVC RA junction. Previous median sternotomy and CABG. Bronchial valves present on the right. Previous lobectomy on the right. No sign of active infiltrate or collapse. There may be a small amount of sub pulmonic effusion on the right. IMPRESSION: Bronchial valves on the right. Small amount of sub pulmonic effusion on the right. No pneumonia visible by radiography. Electronically Signed   By: Nelson Chimes M.D.   On: 02/24/2021 08:40    My personal review of EKG: Rhythm NSR, no ST-T changes   Assessment & Plan:   Active problems  Dyspnea on exertion Neutropenia Chronic pain syndrome  Dyspnea on exertion-unclear etiology, chest x-ray shows small amount of subcu pneumonic effusion on the right, which is chronic.  CTA chest was negative for PE.  No pneumonia visible on x-ray or CT chest.  BNP only 74.  Patient is not hypoxemic.  Not requiring oxygen.  He does have scattered wheezing bilaterally.  Uses inhalers at home.  We will start DuoNeb  every 6 hours for possible mild COPD exacerbation.  Neutropenia-WBC 1.5, neutrophil count 5%.  We will start neutropenic precautions.  Patient has been empirically started on vancomycin and Zosyn for Staph capitis bacteremia on 02/14/2021.  At that time it was felt to be contaminant.  Repeat blood cultures from 02/16/2021 have been negative.  Follow repeat blood cultures drawn today.  Chronic right flank pain-patient had lobectomy on right, also MRI thoracic and lumbar spine showed degenerative disc disease.  Patient was seen by neurosurgery [name] after discharge from hospital.  He is supposed to follow-up with neurosurgery as outpatient.  We will continue with oxycodone 5 mg every 4 hours as needed, will avoid giving IV opioids at this time.  Acute kidney injury versus CKD stage IIIa-patient's creatinine is stable at  1.56.  Avoid nephrotoxins.  Paroxysmal atrial fibrillation-EKG shows normal sinus rhythm, continue amiodarone, metoprolol. CHA2DS2VASc score is 3.  Patient is not on anticoagulation.  Follows up with cardiology as outpatient.  History of CVA-no residual deficits, continue Plavix, aspirin.  History of ischemic cardiomyopathy/diastolic CHF-BNP only 74, last echocardiogram showed EF 55 to 60% with grade 1 diastolic dysfunction.  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  WBC was 2.6 on 02/19/2021.  At that time Dr. [name] recommended no treatment.  Today WBC is 1.5.  We will obtain oncology consultation in a.m.    DVT Prophylaxis-   Lovenox   AM Labs Ordered, also please review Full Orders  Family Communication: Admission, patients condition and plan of care including tests being ordered have been discussed with the patient and who indicate understanding and agree with the plan and Code Status.  Code Status: Full code  Admission status: Observation           Time spent in minutes : 60  minutes   Pritika Alvarez S Kambri Dismore M.D

## 2021-02-24 NOTE — ED Provider Notes (Signed)
[name] Provider Note   CSN: [id number] Arrival date & time: 02/24/21  6237     History Chief Complaint  Patient presents with   Shortness of Breath    [name] is a 66 y.o. 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  Patient ports onset of symptoms yesterday.  He reports he felt "cold and chilly all day".  He describes a headache and malaise towards the evening.  He says he felt abnormally tired.  This morning he was try to take out the trash and felt extremely winded and short of breath after doing this.  He called his oncologist office and was told to come to ED for evaluation.  He had a CT scan of the lungs done approximately 10 days ago which showed patchy multifocal infiltrates consistent with possible pneumonia.  He was in the hospital and treated for pneumonia.  He is not currently on antibiotics.  While in the hospital, according to medical records, blood culture on 6/18 was positive for staph, felt to be a contaminant from hospital discharge summary on 02/19/21.  He also has a history of low back pain with an MRI showing protruding disc at L3-L4 and also at T8-T9.  He reports that he does have persistent pain in a bandlike pattern around his epigastrium.`  Oncologist is Dr Rogue Jury  HPI     Past Medical History:  Diagnosis Date   Anxiety    Arthritis    Basal cell carcinoma (BCC) of forehead    CAD (coronary artery disease)    a. 10/2015 ant STEMI >> LHC with 3 v CAD; oLAD tx with POBA >> emergent CABG. b. Multiple evals since that time, early graft failure of SVG-RCA by cath 03/2016. c. 2/19 PCI/DES x1 to pRCA, normal EF.   Carotid artery disease ([place])    a. 40-59% BICA 02/2018.   Depression    Dyspnea    Ectopic atrial  tachycardia (HCC)    Esophageal reflux    eosinophil esophagitis   Family history of adverse reaction to anesthesia    "sister has PONV" (06/21/2017)   Former tobacco use    Gout    Hepatitis C    "treated and cured" (06/21/2017)   High cholesterol    History of kidney stones    Hypertension    Ischemic cardiomyopathy    a. EF 25-30% at intraop TEE 4/17  //  b. Limited Echo 5/17 - EF 45-50%, mild ant HK. c. EF 55-65% by cath 09/2017.   Migraine    "3-4/yr" (06/21/2017)   Myocardial infarction ([place]) 10/2015   Palpitations    Sinus bradycardia    a. HR dropping into 40s in 02/2016 -> BB reduced.   Stroke [facility]) 10/2016   "small one; sometimes my memory/cognitive issues" (06/21/2017)   Symptomatic hypotension    a. 02/2016 ER visit -> meds reduced.   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   Acute back pain with sciatica 01/12/2021   Allergic rhinitis 01/12/2021   Amnesia 01/12/2021   Kidney stone 01/12/2021   Sciatica 01/12/2021   Bipolar disorder ([place]) 01/12/2021   S/P lobectomy of lung 12/24/2020   Solitary lung nodule 11/28/2020   Chest pain 11/07/2019   Gastroesophageal reflux disease    Cervical radiculopathy 10/17/2018   Preoperative clearance 10/04/2018   Palpitations    Coronary artery disease involving coronary bypass graft of native heart with angina pectoris (HCC)    Transient loss of consciousness 03/24/2018   Ectopic atrial tachycardia ([place]) 02/09/2018   Central chest pain  03/10/2017   Family hx-stroke 11/10/2016   Stroke-like episode ([place]) - R brain, s/p tPA 11/09/2016   Unstable angina ([place]) 09/07/2016   Claudication of both lower extremities ([place])    Pure hypercholesterolemia    Tobacco abuse disorder    CAD of autologous artery bypass graft without angina    Chest pain at rest 06/10/2016   Abnormal nuclear stress test - HIGH RISK 04/20/2016   Old MI (myocardial infarction)    Essential hypertension 02/26/2016   Ischemic cardiomyopathy 12/25/2015   Hyperlipidemia LDL goal <70 12/25/2015   Mild tobacco abuse in early remission 11/28/2015   Coronary artery disease involving native coronary artery of native heart with angina pectoris ([place]) 11/28/2015   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   Acute MI anterior wall first episode care [facility])    Precordial chest pain 03/07/2015   Dysphagia 03/07/2015   Gout attack 03/07/2015   Mixed bipolar I disorder ([place]) 03/07/2015   Fibromyalgia 07/09/2014   Gout 07/09/2014   Anxiety 07/09/2014   Depression 07/09/2014   Hepatitis C 41/63/8453   Eosinophilic esophagitis 64/68/0321    Past Surgical History:  Procedure Laterality Date   25 GAUGE PARS PLANA VITRECTOMY WITH 20 GAUGE MVR PORT  12/31/2020   Procedure: 25 GAUGE PARS PLANA VITRECTOMY WITH 20 GAUGE MVR PORT;  Surgeon: [name], MD;  [facility]: [facility];  Service: Thoracic;;   ANTERIOR CERVICAL DECOMP/DISCECTOMY FUSION N/A 10/17/2018   Procedure: Anterior Cervical Decompression Fusion - Cervical seven -Thoracic one;  Surgeon: Consuella Lose, MD;  Location: [place];  Service: Neurosurgery;  Laterality: N/A;   BASAL CELL CARCINOMA EXCISION     "forehead   BIOPSY  07/20/2019   Procedure: BIOPSY;  Surgeon: Carol Ada, MD;  Location: [facility];  Service: Endoscopy;;   BRONCHIAL BIOPSY  12/24/2020   Procedure: BRONCHIAL BIOPSIES;  Surgeon: Garner Nash, DO;  Location: [place] [facility];  Service: Pulmonary;;   BRONCHIAL BRUSHINGS  12/24/2020   Procedure:  BRONCHIAL BRUSHINGS;  Surgeon: Garner Nash, DO;  Location: [place];  Service: Pulmonary;;   BRONCHIAL NEEDLE ASPIRATION BIOPSY  12/24/2020   Procedure: BRONCHIAL NEEDLE ASPIRATION BIOPSIES;  Surgeon: Garner Nash, DO;  Location: [place];  Service: Pulmonary;;   CARDIAC CATHETERIZATION N/A 11/28/2015   Procedure: Left Heart Cath and Coronary Angiography;  Surgeon: [name], MD;  Location: [place] CV [facility];  Service: Cardiovascular;  Laterality: N/A;   CARDIAC CATHETERIZATION N/A 11/28/2015   Procedure: Coronary Balloon Angioplasty;  Surgeon: [name], MD;  Location: [place] [facility];  Service: Cardiovascular;  Laterality: N/A;  ostial LAD   CARDIAC CATHETERIZATION N/A 11/28/2015   Procedure: Coronary/Graft Angiography;  Surgeon: [name], MD;  Location: [place] [facility];  Service: Cardiovascular;  Laterality: N/A;  coronaries only    CARDIAC CATHETERIZATION N/A 04/21/2016   Procedure: Left Heart Cath and Coronary Angiography;  Surgeon: Wellington [place], MD;  Location: [place] [facility];  Service: Cardiovascular;  Laterality: N/A;   CARDIAC CATHETERIZATION N/A 06/14/2016   Procedure: Left Heart Cath and Cors/Grafts Angiography;  Surgeon: Lorretta Harp, MD;  Location: [place] [facility];  Service: Cardiovascular;  Laterality: N/A;   CARDIAC CATHETERIZATION N/A 09/08/2016   Procedure: Left Heart Cath and Cors/Grafts Angiography;  Surgeon: Wellington Hampshire, MD;  Location: [place] [facility];  Service: Cardiovascular;  Laterality: N/A;   CARDIAC CATHETERIZATION     CORONARY ARTERY BYPASS GRAFT N/A 11/28/2015   Procedure: CORONARY ARTERY BYPASS GRAFTING (CABG) TIMES FIVE USING LEFT INTERNAL MAMMARY ARTERY AND RIGHT GREATER SAPHENOUS,VIEN HARVEATED BY ENDOVIEN, INTRAOPPRATIVE TEE;  Surgeon: Gaye Pollack, MD;  Location: [place];  Service: Open Heart Surgery;  Laterality: N/A;   CORONARY STENT INTERVENTION N/A 10/05/2017   Procedure: CORONARY STENT  INTERVENTION;  Surgeon: [name], MD;  Location: [place] [facility];  Service: Cardiovascular;  Laterality: N/A;   ESOPHAGOGASTRODUODENOSCOPY (EGD) WITH PROPOFOL N/A 07/20/2019   Procedure: ESOPHAGOGASTRODUODENOSCOPY (EGD) WITH PROPOFOL;  Surgeon: Carol Ada, MD;  [facility];  Service: Endoscopy;  Laterality: N/A;   ESOPHAGOGASTRODUODENOSCOPY (EGD) WITH PROPOFOL N/A 01/30/2021   Procedure: ESOPHAGOGASTRODUODENOSCOPY (EGD) WITH PROPOFOL;  Surgeon: Carol Ada, MD;  [facility];  Service: Endoscopy;  Laterality: N/A;   FIDUCIAL MARKER PLACEMENT  12/24/2020   Procedure: FIDUCIAL DYE MARKER PLACEMENT;  Surgeon: Garner Nash, DO;  Location: [place] [facility];  Service: Pulmonary;;   HUMERUS SURGERY Right 1969   "tumor inside bone; filled it w/bone chips"   INTERCOSTAL NERVE BLOCK Right 12/24/2020   Procedure: INTERCOSTAL NERVE BLOCK;  Surgeon: [name], MD;  Location: [place];  Service: Thoracic;  Laterality: Right;   IR IMAGING GUIDED PORT INSERTION  02/06/2021   LEFT HEART CATH AND CORS/GRAFTS ANGIOGRAPHY N/A 03/11/2017   Procedure: Left Heart Cath and Cors/Grafts Angiography;  Surgeon: Leonie Man, MD;  Location: [place] [facility];  Service: Cardiovascular;  Laterality: N/A;   LEFT HEART CATH AND CORS/GRAFTS ANGIOGRAPHY N/A 10/05/2017   Procedure: LEFT HEART CATH AND CORS/GRAFTS ANGIOGRAPHY;  Surgeon: [name], MD;  Location: [place] [facility];  Service: Cardiovascular;  Laterality: N/A;   LEFT HEART CATH AND CORS/GRAFTS ANGIOGRAPHY N/A 04/11/2019   Procedure: LEFT HEART CATH AND CORS/GRAFTS ANGIOGRAPHY;  Surgeon: [name], MD;  Location: [place] [facility];  Service: Cardiovascular;  Laterality: N/A;   NODE DISSECTION Right 12/24/2020   Procedure: NODE DISSECTION;  Surgeon: [name], MD;  Location: [place];  Service: Thoracic;  Laterality: Right;   PERIPHERAL VASCULAR CATHETERIZATION N/A 06/14/2016   Procedure: Lower  Extremity Angiography;  Surgeon: Lorretta Harp, MD;  Location: [place] [facility];  Service: Cardiovascular;  Laterality: N/A;   VIDEO BRONCHOSCOPY WITH ENDOBRONCHIAL NAVIGATION Right 12/24/2020   Procedure: VIDEO BRONCHOSCOPY WITH ENDOBRONCHIAL NAVIGATION;  Surgeon: Garner Nash, DO;  Location: [place];  Service: Pulmonary;  Laterality: Right;   VIDEO BRONCHOSCOPY WITH ENDOBRONCHIAL ULTRASOUND N/A 12/24/2020   Procedure: VIDEO BRONCHOSCOPY WITH ENDOBRONCHIAL ULTRASOUND;  Surgeon: Garner Nash, DO;  Location: [place];  Service: Pulmonary;  Laterality: N/A;   VIDEO BRONCHOSCOPY WITH INSERTION OF INTERBRONCHIAL VALVE (IBV) N/A 12/31/2020   Procedure: VIDEO BRONCHOSCOPY WITH INSERTION OF INTERBRONCHIAL VALVE (IBV).VALVE IN CARTRIDGE 23mm,9mm. CHEST TUBE PLACEMENT.;  Surgeon: [name],  MD;  [facility];  Service: Thoracic;  Laterality: N/A;       Family History  Problem Relation Age of Onset   Lung cancer Mother    Heart Problems Father    Heart attack Father 72   Stroke Father    Heart failure Father    Heart attack Maternal Grandmother    Stroke Maternal Grandmother    Heart attack Paternal Uncle    Hypertension Brother    Autoimmune disease Neg Hx     Social History   Tobacco Use   Smoking status: Former    Packs/day: 0.75    Years: 44.00    Pack years: 33.00    Types: Cigarettes    Quit date: 11/28/2015    Years since quitting: 5.2   Smokeless tobacco: Never  Vaping Use   Vaping Use: Never used  Substance Use Topics   Alcohol use: Yes    Comment: occ   Drug use: No    Comment: 06/21/2017 "nothing since the 1980s"    Home Medications Prior to Admission medications   Medication Sig Start Date End Date Taking? Authorizing Provider  albuterol (VENTOLIN HFA) 108 (90 Base) MCG/ACT inhaler Inhale 2 puffs into the lungs every 6 (six) hours as needed for wheezing or shortness of breath. 01/12/21   Garner Nash, DO  amiodarone (PACERONE) 200 MG  tablet Take 1 tablet (200 mg total) by mouth daily. 01/07/21   Elgie Collard, PA-C  amLODipine (NORVASC) 10 MG tablet TAKE 1 TABLET(10 MG) BY MOUTH DAILY 12/02/20   [name], MD  aspirin EC 81 MG tablet Take 81 mg by mouth in the morning. Swallow whole.    [provider]  atorvastatin (LIPITOR) 80 MG tablet TAKE 1 TABLET(80 MG) BY MOUTH DAILY 12/10/19   [name], MD  clopidogrel (PLAVIX) 75 MG tablet TAKE 1 TABLET BY MOUTH EVERY DAY 02/06/20   [name], MD  escitalopram (LEXAPRO) 10 MG tablet Take 10 mg by mouth daily.    [provider]  fluticasone (FLONASE) 50 MCG/ACT nasal spray Place 2 sprays into both nostrils daily. 01/12/21   Garner Nash, DO  lidocaine-prilocaine (EMLA) cream Apply 1 application topically as needed. 01/30/21   Heilingoetter, Cassandra L, PA-C  metoprolol succinate (TOPROL-XL) 25 MG 24 hr tablet Take 1 tablet (25 mg total) by mouth daily. 08/27/20   [name], MD  Multiple Vitamin (MULTIVITAMIN WITH MINERALS) TABS tablet Take 1 tablet by mouth in the morning. Centrum    [provider]  nitroGLYCERIN (NITROSTAT) 0.4 MG SL tablet PLACE 1 TABLET UNDER THE TONGUE EVERY 5 MINUTES AS NEEDED FOR CHEST PAIN. 3 DOSES MAX 04/16/20   [name], MD  ondansetron (ZOFRAN) 8 MG tablet Take 1 tablet (8 mg total) by mouth every 8 (eight) hours as needed for nausea or vomiting. Starting 3 days after chemotherapy 02/13/21   Heilingoetter, Cassandra L, PA-C  oxyCODONE-acetaminophen (PERCOCET/ROXICET) 5-325 MG tablet Take 1 tablet by mouth every 4 (four) hours as needed for severe pain. 02/19/21   Oswald Hillock, MD  prochlorperazine (COMPAZINE) 10 MG tablet Take 1 tablet (10 mg total) by mouth every 6 (six) hours as needed. 01/14/21   Heilingoetter, Cassandra L, PA-C  ranolazine (RANEXA) 1000 MG SR tablet Take 1 tablet (1,000 mg total) by mouth 2 (two) times daily. 11/08/19   [name], NP  Tiotropium  Bromide-Olodaterol (STIOLTO RESPIMAT) 2.5-2.5 MCG/ACT AERS Inhale 2 puffs into the  lungs daily. 01/12/21   Garner Nash, DO    Allergies    Prednisone, Tetanus toxoids, Wellbutrin [bupropion], Indomethacin, Other, Scallops [shellfish allergy], and Varenicline  Review of Systems   Review of Systems  Constitutional:  Positive for chills and fatigue. Negative for fever.  HENT:  Negative for ear pain and sore throat.   Eyes:  Negative for pain and visual disturbance.  Respiratory:  Positive for shortness of breath. Negative for cough.   Cardiovascular:  Negative for chest pain and palpitations.  Gastrointestinal:  Positive for abdominal pain. Negative for vomiting.  Genitourinary:  Negative for dysuria and hematuria.  Musculoskeletal:  Negative for arthralgias and back pain.  Skin:  Negative for color change and rash.  Neurological:  Positive for headaches. Negative for syncope.  All other systems reviewed and are negative.  Physical Exam Updated Vital Signs BP 109/73   Pulse 66   Temp 98.4 F (36.9 C) (Oral)   Resp 16   Ht 5\' 9"  (1.753 m)   Wt 83.9 kg   SpO2 96%   BMI 27.32 kg/m   Physical Exam Constitutional:      General: He is not in acute distress. HENT:     Head: Normocephalic and atraumatic.  Eyes:     Conjunctiva/sclera: Conjunctivae normal.     Pupils: Pupils are equal, round, and reactive to light.  Cardiovascular:     Rate and Rhythm: Normal rate and regular rhythm.  Pulmonary:     Effort: Pulmonary effort is normal. No respiratory distress.     Breath sounds: Normal breath sounds.     Comments: 99% on room air Diminished BS in lung bases Abdominal:     General: There is no distension.     Tenderness: There is no abdominal tenderness.  Musculoskeletal:     Comments: Chest wall port right upper chest  Skin:    General: Skin is warm and dry.  Neurological:     General: No focal deficit present.     Mental Status: He is alert. Mental status is at  baseline.  Psychiatric:        Mood and Affect: Mood normal.        Behavior: Behavior normal.    ED Results / Procedures / Treatments   Labs (all labs ordered are listed, but only abnormal results are displayed) Labs Reviewed  COMPREHENSIVE METABOLIC PANEL - Abnormal; Notable for the following components:      Result Value   Glucose, Bld 110 (*)    Creatinine, Ser 1.56 (*)    GFR, Estimated 49 (*)    All other components within normal limits  CBC WITH DIFFERENTIAL/PLATELET - Abnormal; Notable for the following components:   WBC 1.5 (*)    RBC 3.73 (*)    Hemoglobin 11.1 (*)    HCT 33.7 (*)    Neutro Abs 0.1 (*)    All other components within normal limits  APTT - Abnormal; Notable for the following components:   aPTT 48 (*)    All other components within normal limits  RESP PANEL BY RT-PCR (FLU A&B, COVID) ARPGX2  CULTURE, BLOOD (SINGLE)  URINE CULTURE  CULTURE, BLOOD (SINGLE)  BRAIN NATRIURETIC PEPTIDE  LACTIC ACID, PLASMA  URINALYSIS, ROUTINE W REFLEX MICROSCOPIC  PROTIME-INR  PATHOLOGIST SMEAR REVIEW  TROPONIN I (HIGH SENSITIVITY)  TROPONIN I (HIGH SENSITIVITY)    EKG None  Radiology CT Angio Chest PE W and/or Wo Contrast  Result Date: 02/24/2021 CLINICAL DATA:  Lung cancer, chemotherapy,  shortness of breath EXAM: CT ANGIOGRAPHY CHEST WITH CONTRAST TECHNIQUE: Multidetector CT imaging of the chest was performed using the standard protocol during bolus administration of intravenous contrast. Multiplanar CT image reconstructions and MIPs were obtained to evaluate the vascular anatomy. CONTRAST:  123mL OMNIPAQUE IOHEXOL 350 MG/ML SOLN COMPARISON:  02/14/2021 FINDINGS: Cardiovascular: Satisfactory opacification of the pulmonary arteries to the segmental level. No evidence of pulmonary embolism. Normal heart size. No pericardial effusion. Coronary artery calcification. Post CABG. Thoracic aorta is stable in caliber measuring dilated at 4 cm along the ascending portion.  Mediastinum/Nodes: No new enlarged lymph nodes. Lungs/Pleura: Increased, small to moderate right pleural effusion with adjacent atelectasis. Post right upper lobectomy. Upper Abdomen: No acute abnormality. Musculoskeletal: No acute osseous abnormality. Review of the MIP images confirms the above findings. IMPRESSION: No acute pulmonary embolism. Increased, small to moderate right pleural effusion. Electronically Signed   By: Macy Mis M.D.   On: 02/24/2021 10:12   DG Chest Port 1 View  Result Date: 02/24/2021 CLINICAL DATA:  Chest pain and shortness of breath. History of lung cancer. Question sepsis. EXAM: PORTABLE CHEST 1 VIEW COMPARISON:  01/07/2021 FINDINGS: Power port placed from a right internal jugular approach has its tip at the SVC RA junction. Previous median sternotomy and CABG. Bronchial valves present on the right. Previous lobectomy on the right. No sign of active infiltrate or collapse. There may be a small amount of sub pulmonic effusion on the right. IMPRESSION: Bronchial valves on the right. Small amount of sub pulmonic effusion on the right. No pneumonia visible by radiography. Electronically Signed   By: Nelson Chimes M.D.   On: 02/24/2021 08:40    Procedures Procedures   Medications Ordered in ED Medications  vancomycin (VANCOCIN) IVPB 1000 mg/200 mL premix (1,000 mg Intravenous New Bag/Given 02/24/21 1403)  piperacillin-tazobactam (ZOSYN) IVPB 3.375 g (has no administration in time range)  vancomycin (VANCOREADY) IVPB 1250 mg/250 mL (has no administration in time range)  HYDROmorphone (DILAUDID) injection 1 mg (1 mg Intravenous Given 02/24/21 0947)  iohexol (OMNIPAQUE) 350 MG/ML injection 100 mL (100 mLs Intravenous Contrast Given 02/24/21 0954)  ondansetron (ZOFRAN) injection 4 mg (4 mg Intravenous Given 02/24/21 1237)  HYDROmorphone (DILAUDID) injection 1 mg (1 mg Intravenous Given 02/24/21 1246)  piperacillin-tazobactam (ZOSYN) IVPB 3.375 g (0 g Intravenous Stopped 02/24/21  1358)    ED Course  I have reviewed the triage vital signs and the nursing notes.  Pertinent labs & imaging results that were available during my care of the patient were reviewed by me and considered in my medical decision making (see chart for details).  This patient complains of shortness of breath, fatigue, chills.  This involves an extensive number of treatment options, and is a complaint that carries with it a high risk of complications and morbidity.  The differential diagnosis includes side effect of chemo medications versus infection (including UTI, COVID, PNA, or other) vs atypical ACS vs new onset CHF vs anemia vs other  Overall the patient is well-appearing clinically on exam.  He is not hypoxic.  He does not appear to be in distress.  He is not requiring oxygen.  He is afebrile here.  Clinically there were no overt signs of congestive heart failure at this time, no leg swelling or crackles on exam.  I will correlate with a BNP level.  He does have abdominal pain that wraps around towards his back.  He is known to have a disc protrusion at T8-T9 which would correlate with  this pain.  It may also be referred pleuritic pain from diaphragmatic irritation.  He does have some tenderness in the right upper quadrant, but  no transaminitis to suggest biliary disease.  He says been worked up multiple times for gallstone issues the past with assessment was normal.  CT abdomen pelvis on 02/14/21 showed normal CBD and gallbladder, no gallstones.  RUQ ultrasound on 02/10/21 was unremarkable and normal.  I doubt this is acute biliary disease or hepatitis.    I ordered, reviewed, and interpreted labs, CMP near baseline including Ct level of 1.56, UA without sign of infection, Lactate 1.4, Trop 3 (with > 6 hours symptom onset, and no chest pain, this is unlikely ACS).  BNP 74. I ordered medication IV antibiotics for empiric coverage for bacteremia I ordered imaging studies which included dg chest,  CTPE I independently visualized and interpreted imaging which showed no life-threatening abnormalities, no evident PNA or PTX or PE, moderate right pleural effusion, and the monitor tracing which showed NSR Previous records obtained and reviewed showing last hospital course  Consulted with oncology as noted below, both agreed that safest plan would be to cover the patient for possible bacteremia, particularly in the setting of his neutropenia.  I have ordered IV broad-spectrum antibiotics here.  We will also add on the second blood culture.  At this time with a normal lactate, normal blood pressure, afebrile.  The patient does not meet SIRS criteria for sepsis.  This pleural effusion is fairly small to moderate in size, and I do not think is causing any of his dyspnea.  He has not hypoxic in the room.     Clinical Course as of 02/24/21 1421  Tue Feb 24, 2021  0942 I reassessed the patient.  We will give him some pain medications for his abdominal pain.  I discussed the repeat CT scan as a PE study.  I think is the best way to rule out PE, evaluate for progression or resolution of this patchy infiltrate seen several days ago.  Can also evaluate for other etiologies of shortness of breath such as a new pleural effusion.  He is in agreement with this plan. [MT]  [phone] At this point with a normal lactate, no tachycardia, no fever, have a lower suspicion for sepsis or infection.  He does have some leukopenia which she has had before, and is most likely side effect of his chemotherapy. [MT]  0954 SARS Coronavirus 2 by RT PCR: NEGATIVE [MT]  9381 IMPRESSION: No acute pulmonary embolism.  Increased, small to moderate right pleural effusion [MT]  1239 I spoke to dr Inda Merlin from oncology who shares my concern about the patient's symptoms, which are suggestive of infection, and his neutropenia which is significant.  I feel at this time the safest plan would be to bring the patient in the hospital for sepsis  and bacteremia rule out, and started on broad-spectrum antibiotics.  Dr Inda Merlin agrees.  I also discussed this with the patient who agrees.  We will add on a second blood culture and have ordered vancomycin and Zosyn.  Additional pain medications ordered. [MT]  1332 Admitted to hospitalist [MT]    Clinical Course User Index [MT] Eswin Worrell, Carola Rhine, MD    Final Clinical Impression(s) / ED Diagnoses Final diagnoses:  Feeling unwell  Shortness of breath  Pleural effusion    Rx / DC Orders ED Discharge Orders     None        Giovanne Nickolson, Carola Rhine, MD  02/24/21 1422  

## 2021-02-24 NOTE — Progress Notes (Signed)
Pharmacy Antibiotic Note  [name] is a 65 y.o. male admitted on 02/24/2021 with pneumonia and bacteremia. Pharmacy has been consulted for vancomycin and zosyn dosing. Pt is afebrile but WBC Is low at 1.5. SCr is elevated at 1.56 but appears to be patients baseline. Lactic acid is <2. Pt is currently receiving chemotherapy for lung cancer.   Plan: Vancomycin 2gm IV x 1 then 1250mg  IV Q24H Zosyn 3.375gm IV Q8H (4 hr inf) F/u renal fxn C&S, clinical status and peak/trough at SS  Height: 5\' 9"  (175.3 cm) Weight: 83.9 kg (185 lb) IBW/kg (Calculated) : 70.7  Temp (24hrs), Avg:98.4 F (36.9 C), Min:98.4 F (36.9 C), Max:98.4 F (36.9 C)  Recent Labs  Lab 02/18/21 1055 02/19/21 0457 02/24/21 0821  WBC 2.6*  --  1.5*  CREATININE 1.69* 1.55* 1.56*  LATICACIDVEN  --   --  1.4    Estimated Creatinine Clearance: 47.2 mL/min (A) (by C-G formula based on SCr of 1.56 mg/dL (H)).    Allergies  Allergen Reactions   Prednisone Other (See Comments)    States that this med makes him "crazy"   Tetanus Toxoids Swelling and Other (See Comments)    Fever, Swelling of the arm    Wellbutrin [Bupropion] Other (See Comments)    Crazy thoughts, nightmares   Indomethacin Other (See Comments)    rectal bleeding   Other     Other reaction(s): Unknown   Scallops [Shellfish Allergy] Nausea Only   Varenicline Other (See Comments)    Dreams Other reaction(s): Unknown    Antimicrobials this admission: Vanc 6/28>> Zosyn 6/28>>  Dose adjustments this admission: N/A  Microbiology results: Pending  Thank you for allowing pharmacy to be a part of this patient's care.  Signora Zucco, Rande Lawman 02/24/2021 1:09 PM

## 2021-02-24 NOTE — Progress Notes (Signed)
[facility] Plan of Care Note  Patient: [name]    DLO:[id number]  PCP: [name], MD    DOB: 07/24/1956  DOS: 02/24/2021   Received a phone call from [place], regarding transfer of [name]. Requesting: EDP, Dr. Langston Masker Reason for transfer: Admission for pneumonia/febrile neutropenia History: Past medical history of lung cancer, with brain mets, cardiomyopathy, CAD on Plavix, HTN.  Recently hospitalized for SIRS, discharged on 6/23.  1 out of 4 culture was positive.  Thought to be false positive.  Discharged home.  Comes back to the ER with fatigue, chills, shortness of breath.  CT PE protocol negative for PE.  Labs were relatively stable.  Had leukopenia in the setting of chemotherapy. Work up: CT PE protocol negative for PE.  Persistent AKI, persistent neutropenia.  Negative UA.  Negative COVID and influenza.  Normal lactic acid.  Troponins negative. Treatment received: IV antibiotics  Plan of care: The patient will be accepted for admission to [place] [facility], [facility].  Author: Berle Mull, MD [facility] 02/24/2021  If 7PM-7AM, please contact night-coverage at [email or web address],

## 2021-02-24 NOTE — ED Notes (Signed)
Patient ambulated from lobby to room 9, SpO2 95-99%, HR 80-90, endorses mild DOE once in room.

## 2021-02-24 NOTE — ED Triage Notes (Signed)
Shortness of breath , lung cancer , chemo started 2 weeks ago. Fatigue. Alert and oriented x 4. Ambulatory to room with steady gait. No neuro deficit

## 2021-02-25 ENCOUNTER — Encounter (HOSPITAL_COMMUNITY): Payer: Self-pay | Admitting: Internal Medicine

## 2021-02-25 ENCOUNTER — Observation Stay (HOSPITAL_COMMUNITY): Payer: Medicare Other

## 2021-02-25 ENCOUNTER — Ambulatory Visit: Payer: Medicare Other | Admitting: Physical Therapy

## 2021-02-25 ENCOUNTER — Telehealth: Payer: Self-pay

## 2021-02-25 ENCOUNTER — Encounter: Payer: Self-pay | Admitting: Internal Medicine

## 2021-02-25 DIAGNOSIS — R109 Unspecified abdominal pain: Secondary | ICD-10-CM

## 2021-02-25 DIAGNOSIS — C7931 Secondary malignant neoplasm of brain: Secondary | ICD-10-CM | POA: Diagnosis present

## 2021-02-25 DIAGNOSIS — Z765 Malingerer [conscious simulation]: Secondary | ICD-10-CM | POA: Diagnosis not present

## 2021-02-25 DIAGNOSIS — Z20822 Contact with and (suspected) exposure to covid-19: Secondary | ICD-10-CM | POA: Diagnosis present

## 2021-02-25 DIAGNOSIS — D709 Neutropenia, unspecified: Secondary | ICD-10-CM | POA: Diagnosis present

## 2021-02-25 DIAGNOSIS — E7800 Pure hypercholesterolemia, unspecified: Secondary | ICD-10-CM | POA: Diagnosis present

## 2021-02-25 DIAGNOSIS — G894 Chronic pain syndrome: Secondary | ICD-10-CM | POA: Diagnosis present

## 2021-02-25 DIAGNOSIS — F32A Depression, unspecified: Secondary | ICD-10-CM | POA: Diagnosis present

## 2021-02-25 DIAGNOSIS — N179 Acute kidney failure, unspecified: Secondary | ICD-10-CM | POA: Diagnosis present

## 2021-02-25 DIAGNOSIS — Z951 Presence of aortocoronary bypass graft: Secondary | ICD-10-CM | POA: Diagnosis not present

## 2021-02-25 DIAGNOSIS — R091 Pleurisy: Secondary | ICD-10-CM

## 2021-02-25 DIAGNOSIS — N1831 Chronic kidney disease, stage 3a: Secondary | ICD-10-CM

## 2021-02-25 DIAGNOSIS — I130 Hypertensive heart and chronic kidney disease with heart failure and stage 1 through stage 4 chronic kidney disease, or unspecified chronic kidney disease: Secondary | ICD-10-CM | POA: Diagnosis present

## 2021-02-25 DIAGNOSIS — Z87891 Personal history of nicotine dependence: Secondary | ICD-10-CM | POA: Diagnosis not present

## 2021-02-25 DIAGNOSIS — J90 Pleural effusion, not elsewhere classified: Secondary | ICD-10-CM | POA: Diagnosis present

## 2021-02-25 DIAGNOSIS — Z823 Family history of stroke: Secondary | ICD-10-CM | POA: Diagnosis not present

## 2021-02-25 DIAGNOSIS — C7A1 Malignant poorly differentiated neuroendocrine tumors: Secondary | ICD-10-CM | POA: Diagnosis present

## 2021-02-25 DIAGNOSIS — Z85828 Personal history of other malignant neoplasm of skin: Secondary | ICD-10-CM | POA: Diagnosis not present

## 2021-02-25 DIAGNOSIS — R0602 Shortness of breath: Secondary | ICD-10-CM | POA: Diagnosis present

## 2021-02-25 DIAGNOSIS — Z923 Personal history of irradiation: Secondary | ICD-10-CM | POA: Diagnosis not present

## 2021-02-25 DIAGNOSIS — I5032 Chronic diastolic (congestive) heart failure: Secondary | ICD-10-CM | POA: Diagnosis present

## 2021-02-25 DIAGNOSIS — N1832 Chronic kidney disease, stage 3b: Secondary | ICD-10-CM | POA: Diagnosis present

## 2021-02-25 DIAGNOSIS — Z801 Family history of malignant neoplasm of trachea, bronchus and lung: Secondary | ICD-10-CM | POA: Diagnosis not present

## 2021-02-25 DIAGNOSIS — I480 Paroxysmal atrial fibrillation: Secondary | ICD-10-CM | POA: Diagnosis present

## 2021-02-25 DIAGNOSIS — Z8673 Personal history of transient ischemic attack (TIA), and cerebral infarction without residual deficits: Secondary | ICD-10-CM | POA: Diagnosis not present

## 2021-02-25 DIAGNOSIS — Z9221 Personal history of antineoplastic chemotherapy: Secondary | ICD-10-CM | POA: Diagnosis not present

## 2021-02-25 DIAGNOSIS — Z8249 Family history of ischemic heart disease and other diseases of the circulatory system: Secondary | ICD-10-CM | POA: Diagnosis not present

## 2021-02-25 DIAGNOSIS — C7A8 Other malignant neuroendocrine tumors: Secondary | ICD-10-CM | POA: Diagnosis present

## 2021-02-25 DIAGNOSIS — D702 Other drug-induced agranulocytosis: Secondary | ICD-10-CM | POA: Diagnosis not present

## 2021-02-25 LAB — CBC
HCT: 33.6 % — ABNORMAL LOW (ref 39.0–52.0)
Hemoglobin: 10.4 g/dL — ABNORMAL LOW (ref 13.0–17.0)
MCH: 28.6 pg (ref 26.0–34.0)
MCHC: 31 g/dL (ref 30.0–36.0)
MCV: 92.3 fL (ref 80.0–100.0)
Platelets: 193 10*3/uL (ref 150–400)
RBC: 3.64 MIL/uL — ABNORMAL LOW (ref 4.22–5.81)
RDW: 12.5 % (ref 11.5–15.5)
WBC: 2 10*3/uL — ABNORMAL LOW (ref 4.0–10.5)
nRBC: 0 % (ref 0.0–0.2)

## 2021-02-25 LAB — COMPREHENSIVE METABOLIC PANEL
ALT: 20 U/L (ref 0–44)
AST: 17 U/L (ref 15–41)
Albumin: 3.6 g/dL (ref 3.5–5.0)
Alkaline Phosphatase: 67 U/L (ref 38–126)
Anion gap: 8 (ref 5–15)
BUN: 20 mg/dL (ref 8–23)
CO2: 28 mmol/L (ref 22–32)
Calcium: 9.2 mg/dL (ref 8.9–10.3)
Chloride: 101 mmol/L (ref 98–111)
Creatinine, Ser: 1.93 mg/dL — ABNORMAL HIGH (ref 0.61–1.24)
GFR, Estimated: 38 mL/min — ABNORMAL LOW (ref >60)
Glucose, Bld: 106 mg/dL — ABNORMAL HIGH (ref 70–99)
Potassium: 4 mmol/L (ref 3.5–5.1)
Sodium: 137 mmol/L (ref 135–145)
Total Bilirubin: 0.4 mg/dL (ref 0.3–1.2)
Total Protein: 6.4 g/dL — ABNORMAL LOW (ref 6.5–8.1)

## 2021-02-25 LAB — BODY FLUID CELL COUNT WITH DIFFERENTIAL
Eos, Fluid: 0 %
Lymphs, Fluid: 92 %
Monocyte-Macrophage-Serous Fluid: 8 % — ABNORMAL LOW (ref 50–90)
Neutrophil Count, Fluid: 0 % (ref 0–25)
Total Nucleated Cell Count, Fluid: 1730 cu mm — ABNORMAL HIGH (ref 0–1000)

## 2021-02-25 LAB — PROTEIN, PLEURAL OR PERITONEAL FLUID: Total protein, fluid: 4.2 g/dL

## 2021-02-25 LAB — LACTATE DEHYDROGENASE, PLEURAL OR PERITONEAL FLUID: LD, Fluid: 165 U/L — ABNORMAL HIGH (ref 3–23)

## 2021-02-25 MED ORDER — LIDOCAINE HCL 1 % IJ SOLN
INTRAMUSCULAR | Status: AC
  Filled 2021-02-25: qty 20

## 2021-02-25 MED ORDER — OXYCODONE-ACETAMINOPHEN 5-325 MG PO TABS
2.0000 | ORAL_TABLET | ORAL | Status: DC | PRN
  Administered 2021-02-25 – 2021-02-27 (×11): 2 via ORAL
  Filled 2021-02-25 (×12): qty 2

## 2021-02-25 MED ORDER — SODIUM CHLORIDE 0.9 % IV SOLN
8.0000 mg | Freq: Four times a day (QID) | INTRAVENOUS | Status: DC | PRN
  Administered 2021-02-25: 8 mg via INTRAVENOUS
  Filled 2021-02-25 (×3): qty 4

## 2021-02-25 MED ORDER — ONDANSETRON HCL 4 MG PO TABS
4.0000 mg | ORAL_TABLET | Freq: Four times a day (QID) | ORAL | Status: DC | PRN
  Administered 2021-02-27: 4 mg via ORAL
  Filled 2021-02-25 (×3): qty 1

## 2021-02-25 MED ORDER — SODIUM CHLORIDE 0.9 % IV SOLN: INTRAVENOUS | Status: DC

## 2021-02-25 MED ORDER — SODIUM CHLORIDE 0.9 % IV SOLN
2.0000 g | Freq: Two times a day (BID) | INTRAVENOUS | Status: DC
  Administered 2021-02-26 – 2021-02-27 (×3): 2 g via INTRAVENOUS
  Filled 2021-02-25 (×5): qty 2

## 2021-02-25 MED ORDER — HYDROMORPHONE HCL 1 MG/ML IJ SOLN
1.0000 mg | INTRAMUSCULAR | Status: AC | PRN
  Administered 2021-02-25 (×2): 1 mg via INTRAVENOUS
  Filled 2021-02-25 (×2): qty 1

## 2021-02-25 NOTE — Progress Notes (Signed)
                                                                                                                                                             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   Plan: The patient will receive a call in about one month from the radiation oncology department. He will be followed in the brain oncology program and will continue follow up with Dr. [name] as well.   ________________________________________________    Carola Rhine, [facility]

## 2021-02-25 NOTE — Progress Notes (Signed)
Pharmacy Antibiotic Note  [name] is a 65 y.o. male admitted on 02/24/2021 with pneumonia and bacteremia (Staph capitis likely contaminant). Pharmacy has been consulted for vancomycin and zosyn dosing. Pt is afebrile but WBC Is low at 1.5. SCr is elevated at 1.56 but appears to be patients baseline. Lactic acid is <2. Pt is currently receiving chemotherapy for lung cancer. [facility] called 6/21 with 2/4 bottles (1 set) with Staph capitis - likely contaminant.  6/28 CXray: increased R Pleural effusion  Plan: Vancomycin 2gm IV x 1 then 1250mg  IV Q24H Zosyn > Cefepime 2gm q12 with worsening renal function   Height: 5\' 9"  (175.3 cm) Weight: 83.9 kg (185 lb) IBW/kg (Calculated) : 70.7  Temp (24hrs), Avg:98.2 F (36.8 C), Min:97.5 F (36.4 C), Max:98.6 F (37 C)  Recent Labs  Lab 02/18/21 1055 02/19/21 0457 02/24/21 0821 02/24/21 1759 02/25/21 0514  WBC 2.6*  --  1.5* 2.5* 2.0*  CREATININE 1.69* 1.55* 1.56* 1.54* 1.93*  LATICACIDVEN  --   --  1.4  --   --      Estimated Creatinine Clearance: 38.2 mL/min (A) (by C-G formula based on SCr of 1.93 mg/dL (H)).    Allergies  Allergen Reactions   Prednisone Other (See Comments)    States that this med makes him "crazy"   Tetanus Toxoids Swelling and Other (See Comments)    Fever, Swelling of the arm    Wellbutrin [Bupropion] Other (See Comments)    Crazy thoughts, nightmares   Indomethacin Other (See Comments)    rectal bleeding   Other     Other reaction(s): Unknown   Scallops [Shellfish Allergy] Nausea Only   Varenicline Other (See Comments)    Dreams Other reaction(s): Unknown Other reaction(s): Unknown    Antimicrobials this admission: 6/28 Vanc >> 6/28 Zosyn >> 6/29 6/29 Cefepime >> Dose adjustments this admission: 6/29 Vanc 1250mg  Q24 (AUC 475, SCr 1.56) adj to 1gm q24  Microbiology results: 6/28 BCx: ngtd 6/28 UCx: sent  Prev Cx 6/20 BCx: ng-final 6/18 BCX: BCID 6/21 in 2 bottles Staph capitis - no resis  noted  Thank you for allowing pharmacy to be a part of this patient's care.  Minda Ditto PharmD 02/25/2021 10:40 AM

## 2021-02-25 NOTE — Telephone Encounter (Signed)
Notification received from after hours center that pt called requesting assistance.  I spoke with pt who advised he is currently inpt and is being told he is going to be discharged but is worried and afraid to be sent home with his white count being low.  Discussed with Dr. [name] who advised for pt to express his concerns with his hospital doc and to have the hospital doc to call him with any questions and recommendations as there is concern regarding the pts ANC being 0.1 as of yesterday.  Pt was advised of this and expressed understanding of this information.

## 2021-02-25 NOTE — Progress Notes (Signed)
[facility] PROGRESS NOTE    Progress Note  [name]  BZJ:[id number] DOB: 01/12/1956 DOA: 02/24/2021 PCP: [name], MD     Brief Narrative:   [name] is an 65 y.o. 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   Assessment/Plan:   Dyspnea on exertion/Pleuresy: Likely due to enlarging right pleural effusion CTA was negative for PE, with a moderate size pleural effusion increased in size from previous. Patient is not hypoxic. Will continue inhalers and ambulate and check saturations. Ct chest show a small to mod right pleural effusion.  Neutropenia: Hematology was consulted and recommended to start empiric IV antibiotics. Positive blood cultures 1 out of 2 on 02/14/2021, repeated BC on 6.20.2022 negative. BC on 6.29.2022 negative till date.  Chronic right flank pain ? Due to right pleural effusion: Patient has a follow-up appointment with [name] as an outpatient, continue oral narcotics.    Acute kidney injury on chronic kidney disease stage IIIb: His creatinine is worsening on admission 1.5 now 1.9 we will start him  aggressive IV fluid hydration discontinue Zosyn start on cefepime.  Paroxysmal atrial fibrillation: Patient is in sinus rhythm, continue metoprolol and amiodarone. Not on anticoagulation follow-up with cardiology as an outpatient.  History of CVA No residual deficits continue aspirin and Plavix.  History of ischemic cardiomyopathy/chronic diastolic heart failure: With a last 2D echo showing an EF of 55%,  asymptomatic.  Neuroendocrine lung carcinoma with brain metastases: Postchemotherapy and radiation therapy with neutropenia.  He was discussed with Dr. [name] at that time recommended no further treatment.   DVT prophylaxis: lovenox Family Communication:none Status is: Observation  The patient will require care spanning > 2 midnights and should be moved to inpatient because: Hemodynamically unstable  Dispo: The patient is from: Home              Anticipated d/c is to: Home              Patient currently is not medically stable to d/c.   Difficult to place patient No        Code Status:     Code Status Orders  (From admission, onward)           Start     Ordered   02/24/21 1724  Full code  Continuous        02/24/21 1723           Code Status History     Date Active Date Inactive Code Status Order ID Comments User Context   02/16/2021 1907 02/19/2021 2234 Full Code [id number]  Mercy Riding, MD ED   12/24/2020 1248 01/07/2021 1755 Full Code [id number]  Antony Odea, PA-C Inpatient   11/07/2019 1741 11/08/2019 1754 Full Code [id number]  [name], PA-C Inpatient   04/11/2019 1644 04/12/2019 1531 Full Code [id number]  [name], MD Inpatient   10/17/2018 1539 10/18/2018 1725 Full Code [id number]  [name] Inpatient   03/24/2018 2247 03/26/2018 1818 Full Code [id number]  [name], MD Inpatient   03/24/2018 1553 03/24/2018 2247 Full Code [id number]  Rodena Piety,  Noland Fordyce, MD ED   02/07/2018 1202 02/09/2018 2055 Full Code [id number]  [name], [place] ED   11/13/2017 0727 11/13/2017 1608 Full Code [id number]  [name], PA-C ED   10/04/2017 1707 10/06/2017 1624 Full Code [id number]  [name], DO ED   06/21/2017 1455 06/22/2017 2228 Full Code [id number]  [name], NP ED   03/10/2017 1219 03/13/2017 1440 Full Code [id number]  [name], NP ED   01/04/2017 1955 01/05/2017 2032 Full Code [id number]  [name], MD Inpatient   11/09/2016 1045 11/10/2016 2007 Full Code [id number]  [name], MD ED   09/07/2016 1822 09/09/2016 1835 Full Code [id number]  [name], NP Inpatient   06/10/2016 2358 06/15/2016 1737 Full Code [id number]  [name], MD Inpatient   04/19/2016 1854 04/21/2016 2044 Full Code [id number]  [name], NP Inpatient   03/07/2015 1755 03/08/2015 1441 Full Code [id number]  [name], MD Inpatient   11/19/2012 0037 11/20/2012 2131 Full Code [id number]  [name]., DO Inpatient         IV Access:   Peripheral IV   Procedures and diagnostic studies:   CT Angio Chest PE W and/or Wo Contrast  Result Date: 02/24/2021 CLINICAL DATA:  Lung cancer, chemotherapy, shortness of breath EXAM: CT ANGIOGRAPHY CHEST WITH CONTRAST TECHNIQUE: Multidetector CT imaging of the chest was performed using the standard protocol during bolus administration of intravenous contrast. Multiplanar CT image reconstructions and MIPs were obtained to evaluate the vascular anatomy. CONTRAST:  144mL OMNIPAQUE IOHEXOL 350 MG/ML SOLN COMPARISON:  02/14/2021 FINDINGS: Cardiovascular: Satisfactory opacification of the pulmonary arteries to the segmental level. No evidence of pulmonary embolism. Normal heart size. No pericardial effusion. Coronary artery calcification. Post CABG. Thoracic aorta is stable in caliber measuring dilated at 4 cm along the ascending portion. Mediastinum/Nodes: No new enlarged lymph nodes. Lungs/Pleura: Increased, small to moderate right pleural effusion with adjacent atelectasis. Post right upper lobectomy. Upper Abdomen: No acute abnormality. Musculoskeletal: No acute osseous abnormality. Review of the MIP images confirms the above findings. IMPRESSION: No acute pulmonary embolism. Increased, small to moderate right pleural effusion. Electronically Signed   By: Macy Mis M.D.   On: 02/24/2021 10:12   DG Chest Port 1 View  Result Date: 02/24/2021 CLINICAL DATA:   Chest pain and shortness of breath. History of lung cancer. Question sepsis. EXAM: PORTABLE CHEST 1 VIEW COMPARISON:  01/07/2021 FINDINGS: Power port placed from a right internal jugular approach has its tip at the SVC RA junction. Previous median sternotomy and CABG. Bronchial valves present on the right. Previous lobectomy on the right. No sign of active infiltrate or collapse. There may be a small amount of sub pulmonic effusion on the right. IMPRESSION: Bronchial valves on the right. Small amount of sub pulmonic effusion on the right. No pneumonia visible by radiography. Electronically Signed   By: Nelson Chimes M.D.   On: 02/24/2021 08:40     Medical Consultants:   None.   Subjective:    [name] cont to have right flank pain  Objective:    Vitals:   02/24/21 2129 02/25/21 0042 02/25/21 0437 02/25/21 0802  BP:  134/85 97/61   Pulse:  72 66   Resp:  18 18   Temp:  98.2 F (36.8 C) (!) 97.5 F (36.4 C)   TempSrc:   Oral   SpO2: 98% 99% 100% 96%  Weight:      Height:  SpO2: 96 %   Intake/Output Summary (Last 24 hours) at 02/25/2021 1021 Last data filed at 02/24/2021 1930 Gross per 24 hour  Intake 688.34 ml  Output 300 ml  Net 388.34 ml   Filed Weights   02/24/21 0741  Weight: 83.9 kg    Exam: General exam: In no acute distress. Respiratory system: Good air movement and clear to auscultation. Cardiovascular system: S1 & S2 heard, RRR. No JVD. Gastrointestinal system: Abdomen is nondistended, soft and nontender.  Extremities: No pedal edema. Skin: No rashes, lesions or ulcers Data Reviewed:    Labs: Basic Metabolic Panel: Recent Labs  Lab 02/18/21 1055 02/19/21 0457 02/24/21 0821 02/24/21 1759 02/25/21 0514  NA 136 138 137  --  137  K 4.3 4.1 4.6  --  4.0  CL 102 106 103  --  101  CO2 28 27 26   --  28  GLUCOSE 105* 103* 110*  --  106*  BUN 25* 21 20  --  20  CREATININE 1.69* 1.55* 1.56* 1.54* 1.93*  CALCIUM 9.2 9.1 9.3  --  9.2  MG 1.9   --   --   --   --   PHOS 3.1  --   --   --   --    GFR Estimated Creatinine Clearance: 38.2 mL/min (A) (by C-G formula based on SCr of 1.93 mg/dL (H)). Liver Function Tests: Recent Labs  Lab 02/18/21 1055 02/24/21 0821 02/25/21 0514  AST 17 19 17   ALT 19 21 20   ALKPHOS 70 69 67  BILITOT 0.6 0.4 0.4  PROT 6.3* 6.8 6.4*  ALBUMIN 3.7 3.8 3.6   No results for input(s): LIPASE, AMYLASE in the last 168 hours. No results for input(s): AMMONIA in the last 168 hours. Coagulation profile Recent Labs  Lab 02/24/21 0917  INR 1.1   COVID-19 Labs  No results for input(s): DDIMER, FERRITIN, LDH, CRP in the last 72 hours.  Lab Results  Component Value Date   SARSCOV2NAA NEGATIVE 02/24/2021   [place] NEGATIVE 02/16/2021   [place] NEGATIVE 02/14/2021   [place] NEGATIVE 12/22/2020    CBC: Recent Labs  Lab 02/18/21 1055 02/24/21 0821 02/24/21 1759 02/25/21 0514  WBC 2.6* 1.5* 2.5* 2.0*  NEUTROABS 1.1* 0.1*  --   --   HGB 10.7* 11.1* 11.4* 10.4*  HCT 33.0* 33.7* 34.8* 33.6*  MCV 92.2 90.3 90.4 92.3  PLT 186 207 228 193   Cardiac Enzymes: No results for input(s): CKTOTAL, CKMB, CKMBINDEX, TROPONINI in the last 168 hours. BNP (last 3 results) No results for input(s): PROBNP in the last 8760 hours. CBG: No results for input(s): GLUCAP in the last 168 hours. D-Dimer: No results for input(s): DDIMER in the last 72 hours. Hgb A1c: No results for input(s): HGBA1C in the last 72 hours. Lipid Profile: No results for input(s): CHOL, HDL, LDLCALC, TRIG, CHOLHDL, LDLDIRECT in the last 72 hours. Thyroid function studies: No results for input(s): TSH, T4TOTAL, T3FREE, THYROIDAB in the last 72 hours.  Invalid input(s): FREET3 Anemia work up: No results for input(s): VITAMINB12, FOLATE, FERRITIN, TIBC, IRON, RETICCTPCT in the last 72 hours. Sepsis Labs: Recent Labs  Lab 02/18/21 1055 02/24/21 0821 02/24/21 1759 02/25/21 0514  WBC 2.6* 1.5* 2.5* 2.0*  LATICACIDVEN   --  1.4  --   --    Microbiology Recent Results (from the past 240 hour(s))  Culture, blood (Routine X 2) w Reflex to ID Panel     Status: None   Collection Time: 02/16/21  5:36 PM   Specimen: Chest; Blood  Result Value Ref Range Status   Specimen Description   Final    CHEST RIGHT Performed at [facility] [address] [place], [place] [address]    Special Requests   Final    BOTTLES DRAWN AEROBIC AND ANAEROBIC Blood Culture adequate volume Performed at [facility] [address] [place], [place] [address]    Culture   Final    NO GROWTH 5 DAYS Performed at [facility], [place] [address], [place], [place] [address]    Report Status 02/21/2021 FINAL  Final  Culture, blood (Routine X 2) w Reflex to ID Panel     Status: None   Collection Time: 02/16/21  5:36 PM   Specimen: Chest; Blood  Result Value Ref Range Status   Specimen Description   Final    CHEST RIGHT Performed at [facility] [address] [place], [place] [address]    Special Requests   Final    BOTTLES DRAWN AEROBIC AND ANAEROBIC Blood Culture adequate volume Performed at [facility] [address] [place], [place] [address]    Culture   Final    NO GROWTH 5 DAYS Performed at [facility], [place] [address], [place], [place] [address]    Report Status 02/21/2021 FINAL  Final  Resp Panel by RT-PCR (Flu A&B, Covid) Nasopharyngeal Swab     Status: None   Collection Time: 02/16/21  5:59 PM   Specimen: Nasopharyngeal Swab; Nasopharyngeal(NP) swabs in vial transport medium  Result Value Ref Range Status   SARS Coronavirus 2 by RT PCR NEGATIVE NEGATIVE Final    Comment: (NOTE) SARS-CoV-2 target nucleic acids are NOT DETECTED.  The SARS-CoV-2 RNA is generally detectable in upper respiratory specimens during the acute phase of infection. The lowest concentration of SARS-CoV-2 viral copies this assay can detect is 138  copies/mL. A negative result does not preclude SARS-Cov-2 infection and should not be used as the sole basis for treatment or other patient management decisions. A negative result may occur with  improper specimen collection/handling, submission of specimen other than nasopharyngeal swab, presence of viral mutation(s) within the areas targeted by this assay, and inadequate number of viral copies(<138 copies/mL). A negative result must be combined with clinical observations, patient history, and epidemiological information. The expected result is Negative.  Fact Sheet for Patients:  EntrepreneurPulse.com.au  Fact Sheet for Healthcare Providers:  IncredibleEmployment.be  This test is no t yet approved or cleared by the [place] FDA and  has been authorized for detection and/or diagnosis of SARS-CoV-2 by FDA under an Emergency Use Authorization (EUA). This EUA will remain  in effect (meaning this test can be used) for the duration of the COVID-19 declaration under Section 564(b)(1) of the Act, 21 U.S.C.section 360bbb-3(b)(1), unless the authorization is terminated  or revoked sooner.       Influenza A by PCR NEGATIVE NEGATIVE Final   Influenza B by PCR NEGATIVE NEGATIVE Final    Comment: (NOTE) The Xpert Xpress SARS-CoV-2/FLU/RSV plus assay is intended as an aid in the diagnosis of influenza from Nasopharyngeal swab specimens and should not be used as a sole basis for treatment. Nasal washings and aspirates are unacceptable for Xpert Xpress SARS-CoV-2/FLU/RSV testing.  Fact Sheet for Patients: EntrepreneurPulse.com.au  Fact Sheet for Healthcare Providers: IncredibleEmployment.be  This test is not yet approved or cleared by the [place] FDA and has been authorized for detection  and/or diagnosis of SARS-CoV-2 by FDA under an Emergency Use Authorization (EUA). This EUA will remain in effect (meaning  this test can be used) for the duration of the COVID-19 declaration under Section 564(b)(1) of the Act, 21 U.S.C. section 360bbb-3(b)(1), unless the authorization is terminated or revoked.  Performed at [facility], [place] [address] [place], [place] [address]   Blood culture (routine single)     Status: None (Preliminary result)   Collection Time: 02/24/21  8:35 AM   Specimen: BLOOD RIGHT ARM  Result Value Ref Range Status   Specimen Description   Final    BLOOD RIGHT ARM Performed at [facility], [address] [place], [place] [address]    Special Requests   Final    BOTTLES DRAWN AEROBIC AND ANAEROBIC Blood Culture adequate volume Performed at [facility], [address] [place], [place] [address]    Culture   Final    NO GROWTH < 24 HOURS Performed at [facility], [place] [address], [place], [place] [address]    Report Status PENDING  Incomplete  Resp Panel by RT-PCR (Flu A&B, Covid) Nasopharyngeal Swab     Status: None   Collection Time: 02/24/21  8:53 AM   Specimen: Nasopharyngeal Swab; Nasopharyngeal(NP) swabs in vial transport medium  Result Value Ref Range Status   SARS Coronavirus 2 by RT PCR NEGATIVE NEGATIVE Final    Comment: (NOTE) SARS-CoV-2 target nucleic acids are NOT DETECTED.  The SARS-CoV-2 RNA is generally detectable in upper respiratory specimens during the acute phase of infection. The lowest concentration of SARS-CoV-2 viral copies this assay can detect is 138 copies/mL. A negative result does not preclude SARS-Cov-2 infection and should not be used as the sole basis for treatment or other patient management decisions. A negative result may occur with  improper specimen collection/handling, submission of specimen other than nasopharyngeal swab, presence of viral mutation(s) within the areas targeted by this assay, and inadequate number of viral copies(<138 copies/mL). A negative result must be combined  with clinical observations, patient history, and epidemiological information. The expected result is Negative.  Fact Sheet for Patients:  EntrepreneurPulse.com.au  Fact Sheet for Healthcare Providers:  IncredibleEmployment.be  This test is no t yet approved or cleared by the [place] FDA and  has been authorized for detection and/or diagnosis of SARS-CoV-2 by FDA under an Emergency Use Authorization (EUA). This EUA will remain  in effect (meaning this test can be used) for the duration of the COVID-19 declaration under Section 564(b)(1) of the Act, 21 U.S.C.section 360bbb-3(b)(1), unless the authorization is terminated  or revoked sooner.       Influenza A by PCR NEGATIVE NEGATIVE Final   Influenza B by PCR NEGATIVE NEGATIVE Final    Comment: (NOTE) The Xpert Xpress SARS-CoV-2/FLU/RSV plus assay is intended as an aid in the diagnosis of influenza from Nasopharyngeal swab specimens and should not be used as a sole basis for treatment. Nasal washings and aspirates are unacceptable for Xpert Xpress SARS-CoV-2/FLU/RSV testing.  Fact Sheet for Patients: EntrepreneurPulse.com.au  Fact Sheet for Healthcare Providers: IncredibleEmployment.be  This test is not yet approved or cleared by the [place] FDA and has been authorized for detection and/or diagnosis of SARS-CoV-2 by FDA under an Emergency Use Authorization (EUA). This EUA will remain in effect (meaning this test can be used) for the duration of the COVID-19 declaration under Section 564(b)(1) of the Act, 21 U.S.C. section 360bbb-3(b)(1), unless the  authorization is terminated or revoked.  Performed at [facility], [address] [place], [place] [address]   Culture, blood (single)     Status: None (Preliminary result)   Collection Time: 02/24/21  1:17 PM   Specimen: Left Antecubital; Blood  Result Value Ref Range Status    Specimen Description   Final    LEFT ANTECUBITAL Performed at [facility], [address] [place], [place] [address]    Special Requests   Final    BOTTLES DRAWN AEROBIC AND ANAEROBIC Blood Culture adequate volume Performed at [facility], [address] [place], [place] [address]    Culture   Final    NO GROWTH < 12 HOURS Performed at [facility], [place] [address] [place], [place] [address]    Report Status PENDING  Incomplete     Medications:    amiodarone  200 mg Oral Daily   amLODipine  10 mg Oral Daily   aspirin EC  81 mg Oral q AM   atorvastatin  80 mg Oral Daily   clopidogrel  75 mg Oral Daily   enoxaparin (LOVENOX) injection  40 mg Subcutaneous Q24H   feeding supplement  237 mL Oral BID BM   ipratropium-albuterol  3 mL Nebulization TID   metoprolol succinate  25 mg Oral Daily   ranolazine  1,000 mg Oral BID   sodium chloride flush  3 mL Intravenous Q12H   Continuous Infusions:  sodium chloride     methocarbamol (ROBAXIN) IV     piperacillin-tazobactam (ZOSYN)  IV 3.375 g (02/25/21 0406)   vancomycin        LOS: 0 days   [name]  [facility]  02/25/2021, 10:21 AM

## 2021-02-25 NOTE — Procedures (Signed)
Ultrasound-guided diagnostic and therapeutic right thoracentesis performed yielding 220 cc of hazy, amber fluid. No immediate complications. Follow-up chest x-ray pending. The fluid was sent to the lab for preordered studies. EBL none.

## 2021-02-26 DIAGNOSIS — C7A1 Malignant poorly differentiated neuroendocrine tumors: Secondary | ICD-10-CM

## 2021-02-26 DIAGNOSIS — D702 Other drug-induced agranulocytosis: Secondary | ICD-10-CM

## 2021-02-26 DIAGNOSIS — J90 Pleural effusion, not elsewhere classified: Principal | ICD-10-CM

## 2021-02-26 LAB — CBC WITH DIFFERENTIAL/PLATELET
Abs Immature Granulocytes: 0.05 10*3/uL (ref 0.00–0.07)
Basophils Absolute: 0 10*3/uL (ref 0.0–0.1)
Basophils Relative: 1 %
Eosinophils Absolute: 0 10*3/uL (ref 0.0–0.5)
Eosinophils Relative: 1 %
HCT: 37 % — ABNORMAL LOW (ref 39.0–52.0)
Hemoglobin: 11.8 g/dL — ABNORMAL LOW (ref 13.0–17.0)
Immature Granulocytes: 2 %
Lymphocytes Relative: 63 %
Lymphs Abs: 1.3 10*3/uL (ref 0.7–4.0)
MCH: 29.7 pg (ref 26.0–34.0)
MCHC: 31.9 g/dL (ref 30.0–36.0)
MCV: 93.2 fL (ref 80.0–100.0)
Monocytes Absolute: 0.5 10*3/uL (ref 0.1–1.0)
Monocytes Relative: 23 %
Neutro Abs: 0.2 10*3/uL — CL (ref 1.7–7.7)
Neutrophils Relative %: 10 %
Platelets: 277 10*3/uL (ref 150–400)
RBC: 3.97 MIL/uL — ABNORMAL LOW (ref 4.22–5.81)
RDW: 12.6 % (ref 11.5–15.5)
WBC: 2.2 10*3/uL — ABNORMAL LOW (ref 4.0–10.5)
nRBC: 0 % (ref 0.0–0.2)

## 2021-02-26 LAB — URINE CULTURE: Culture: NO GROWTH

## 2021-02-26 LAB — GRAM STAIN

## 2021-02-26 LAB — BASIC METABOLIC PANEL
Anion gap: 7 (ref 5–15)
BUN: 20 mg/dL (ref 8–23)
CO2: 27 mmol/L (ref 22–32)
Calcium: 8.7 mg/dL — ABNORMAL LOW (ref 8.9–10.3)
Chloride: 102 mmol/L (ref 98–111)
Creatinine, Ser: 1.6 mg/dL — ABNORMAL HIGH (ref 0.61–1.24)
GFR, Estimated: 48 mL/min — ABNORMAL LOW (ref >60)
Glucose, Bld: 111 mg/dL — ABNORMAL HIGH (ref 70–99)
Potassium: 4 mmol/L (ref 3.5–5.1)
Sodium: 136 mmol/L (ref 135–145)

## 2021-02-26 LAB — CYTOLOGY - NON PAP

## 2021-02-26 LAB — LACTATE DEHYDROGENASE: LDH: 147 U/L (ref 98–192)

## 2021-02-26 LAB — PATHOLOGIST SMEAR REVIEW

## 2021-02-26 MED ORDER — TBO-FILGRASTIM 480 MCG/0.8ML ~~LOC~~ SOSY
480.0000 ug | PREFILLED_SYRINGE | Freq: Every day | SUBCUTANEOUS | Status: DC
  Administered 2021-02-26: 480 ug via SUBCUTANEOUS
  Filled 2021-02-26 (×2): qty 0.8

## 2021-02-26 MED ORDER — SODIUM CHLORIDE 0.9 % IV SOLN: INTRAVENOUS | Status: AC

## 2021-02-26 MED ORDER — POLYETHYLENE GLYCOL 3350 17 G PO PACK
17.0000 g | PACK | Freq: Every day | ORAL | Status: DC
  Administered 2021-02-26: 17 g via ORAL
  Filled 2021-02-26 (×2): qty 1

## 2021-02-26 NOTE — Progress Notes (Addendum)
Mobility Specialist - Progress Note   02/26/21 1505  Mobility  Activity Ambulated in hall  Level of Assistance Independent  Assistive Device None  Distance Ambulated (ft) 1150 ft  Mobility Ambulated independently in hallway  Mobility Response Tolerated well  Mobility performed by Mobility specialist  $Mobility charge 1 Mobility    Pt ambulated 1117ft in hallway. Pt c/o of SOB and stated "I can get wobbly" during second lap of ambulation. Attempted to get SpO2 during ambulation with pulse ox, but pulse ox could not receive a stable reading. Pt left in bed at end of session with family in room and call bell at side.   [place] Specialist Acute Rehabilitation Services Office: [phone] 02/26/21, 3:11 PM

## 2021-02-26 NOTE — Progress Notes (Addendum)
HEMATOLOGY-ONCOLOGY PROGRESS NOTE  SUBJECTIVE: [name] is followed by our office for stage IV large cell neuroendocrine carcinoma.  He is currently receiving adjuvant chemotherapy with cisplatin 60 mg per metered squared on day 1 with etoposide 120 mg per metered squared days 1, 2, and 3 every 3 weeks.  He has received only 1 cycle to date. Now admitted for dyspnea and neutropenia.  Status post thoracentesis 6/29 with 200 cc of fluid removed.  Cytology pending.  He is receiving broad-spectrum antibiotics and cultures drawn 6/29 are negative to date.  CBC obtained this morning showed a WBC of 2.2, hemoglobin 11.8, platelets 277,000, ANC is 0.2.  The patient remains afebrile.  Breathing is stable.  He offers no other specific complaints today.  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[place])    01/29/2021 Cancer Staging   Staging form: Lung, AJCC 8th Edition - Clinical: Stage IVA (cT1c, cN1, cM1b) - Signed by Curt Bears, MD on 01/29/2021       REVIEW OF SYSTEMS:   Constitutional: Denies fevers, chills  Eyes: Denies blurriness of vision Ears, nose, mouth, throat, and face: Denies mucositis or sore throat Respiratory: Denies cough, dyspnea or wheezes Cardiovascular: Denies palpitation, chest discomfort Gastrointestinal:  Denies nausea, heartburn or change in bowel habits Skin: Denies abnormal skin rashes Lymphatics: Denies new lymphadenopathy or easy bruising Neurological:Denies numbness, tingling or new weaknesses Behavioral/Psych: Mood is stable, no new changes  Extremities: No lower extremity edema All other  systems were reviewed with the patient and are negative.  I have reviewed the past medical history, past surgical history, social history and family history with the patient and they are unchanged from previous note.   PHYSICAL EXAMINATION: ECOG PERFORMANCE STATUS: 1 - Symptomatic but completely ambulatory  Vitals:   02/26/21 0328 02/26/21 0812  BP: 116/61   Pulse: 70 76  Resp: 15 20  Temp: 97.9 F (36.6 C)   SpO2: 99% 95%   Filed Weights   02/24/21 0741  Weight: 83.9 kg    Intake/Output from previous day: 06/29 0701 - 06/30 0700 In: 2102.3 [P.O.:355; I.V.:1543.3; IV Piggyback:204] Out: 975 [Urine:975]  GENERAL:alert, no distress and comfortable SKIN: skin color, texture, turgor are normal, no rashes or significant lesions EYES: normal, Conjunctiva are pink and non-injected, sclera clear LUNGS: Clear to auscultation bilaterally. HEART: regular rate & rhythm and no murmurs and no lower extremity edema ABDOMEN:abdomen soft, non-tender and normal bowel sounds NEURO: alert & oriented x 3 with fluent speech, no focal motor/sensory deficits  LABORATORY DATA:  I have reviewed the data as listed CMP Latest Ref Rng & Units 02/26/2021 02/25/2021 02/24/2021  Glucose 70 - 99 mg/dL 111(H) 106(H) -  BUN 8 - 23 mg/dL 20 20 -  Creatinine 0.61 - 1.24 mg/dL 1.60(H) 1.93(H) 1.54(H)  Sodium 135 - 145 mmol/L 136 137 -  Potassium 3.5 - 5.1 mmol/L 4.0 4.0 -  Chloride 98 - 111 mmol/L 102 101 -  CO2 22 - 32 mmol/L 27 28 -  Calcium 8.9 - 10.3 mg/dL 8.7(L) 9.2 -  Total Protein 6.5 - 8.1 g/dL - 6.4(L) -  Total Bilirubin 0.3 -  1.2 mg/dL - 0.4 -  Alkaline Phos 38 - 126 U/L - 67 -  AST 15 - 41 U/L - 17 -  ALT 0 - 44 U/L - 20 -    Lab Results  Component Value Date   WBC 2.0 (L) 02/25/2021   HGB 10.4 (L) 02/25/2021   HCT 33.6 (L) 02/25/2021   MCV 92.3 02/25/2021   PLT 193 02/25/2021   NEUTROABS 0.1 (LL) 02/24/2021    DG Chest 1 View  Result Date: 02/25/2021 CLINICAL DATA:  Post  thoracentesis EXAM: CHEST  1 VIEW COMPARISON:  02/24/2021 radiograph and chest CT FINDINGS: Post sternotomy changes. Right-sided central venous catheter tip over the proximal right atrium. Trace right-sided pleural effusion. No visible pneumothorax. Stable cardiomediastinal silhouette with aortic atherosclerosis. Surgical hardware at the thoracic inlet region. IMPRESSION: 1. Trace right pleural effusion without visible pneumothorax post thoracentesis. 2. No significant changes. Electronically Signed   By: Donavan Foil M.D.   On: 02/25/2021 15:27   DG Abd 1 View  Result Date: 02/10/2021 CLINICAL DATA:  Right flank pain EXAM: ABDOMEN - 1 VIEW COMPARISON:  12/20/2018 FINDINGS: The bowel gas pattern is normal. No radio-opaque calculi or other significant radiographic abnormality are seen. Small right pleural effusion. Partially imaged port catheter at the cavoatrial junction. IMPRESSION: No urinary tract calculi identified. Electronically Signed   By: Macy Mis M.D.   On: 02/10/2021 14:02   CT Angio Chest PE W and/or Wo Contrast  Result Date: 02/24/2021 CLINICAL DATA:  Lung cancer, chemotherapy, shortness of breath EXAM: CT ANGIOGRAPHY CHEST WITH CONTRAST TECHNIQUE: Multidetector CT imaging of the chest was performed using the standard protocol during bolus administration of intravenous contrast. Multiplanar CT image reconstructions and MIPs were obtained to evaluate the vascular anatomy. CONTRAST:  175mL OMNIPAQUE IOHEXOL 350 MG/ML SOLN COMPARISON:  02/14/2021 FINDINGS: Cardiovascular: Satisfactory opacification of the pulmonary arteries to the segmental level. No evidence of pulmonary embolism. Normal heart size. No pericardial effusion. Coronary artery calcification. Post CABG. Thoracic aorta is stable in caliber measuring dilated at 4 cm along the ascending portion. Mediastinum/Nodes: No new enlarged lymph nodes. Lungs/Pleura: Increased, small to moderate right pleural effusion with adjacent  atelectasis. Post right upper lobectomy. Upper Abdomen: No acute abnormality. Musculoskeletal: No acute osseous abnormality. Review of the MIP images confirms the above findings. IMPRESSION: No acute pulmonary embolism. Increased, small to moderate right pleural effusion. Electronically Signed   By: Macy Mis M.D.   On: 02/24/2021 10:12   CT Angio Chest PE W and/or Wo Contrast  Result Date: 02/14/2021 CLINICAL DATA:  Recent surgery. Cancer. Right flank pain. Tachycardia. Pulmonary embolus suspected with high probability. Recent diagnosis of UTI without improvement. History of lung cancer metastatic to brain. EXAM: CT ANGIOGRAPHY CHEST CT ABDOMEN AND PELVIS WITH CONTRAST TECHNIQUE: Multidetector CT imaging of the chest was performed using the standard protocol during bolus administration of intravenous contrast. Multiplanar CT image reconstructions and MIPs were obtained to evaluate the vascular anatomy. Multidetector CT imaging of the abdomen and pelvis was performed using the standard protocol during bolus administration of intravenous contrast. CONTRAST:  140mL OMNIPAQUE IOHEXOL 350 MG/ML SOLN COMPARISON:  CT chest 12/12/2020.  CT abdomen and pelvis 02/11/2021 FINDINGS: CTA CHEST FINDINGS Cardiovascular: There is good opacification of the central and segmental pulmonary arteries. No focal filling defects. No evidence of significant pulmonary embolus. Ascending aortic aneurysm measuring 4 cm in diameter, unchanged. No aortic dissection. Great vessel origins are patent. Coronary artery and aortic calcification. Postoperative changes consistent with coronary  bypass. Normal heart size. No pericardial effusions. Mediastinum/Nodes: Esophagus is decompressed. No significant lymphadenopathy in the chest. Scattered lymph nodes are not pathologically enlarged. Lungs/Pleura: Small right pleural effusion. Mild patchy areas of airspace disease in the right lung could represent early multifocal pneumonia. Left lung is  clear. Previous right upper lung nodule has been resected in the interval. Musculoskeletal: Degenerative changes in the spine. Postoperative changes in the lower cervical spine. Sternotomy wires. Old rib fractures. Review of the MIP images confirms the above findings. CT ABDOMEN and PELVIS FINDINGS Hepatobiliary: Subcentimeter lesion in the dome of the liver likely representing a cavernous hemangioma. Gallbladder and bile ducts are unremarkable. Pancreas: Unremarkable. No pancreatic ductal dilatation or surrounding inflammatory changes. Spleen: Normal in size without focal abnormality. Adrenals/Urinary Tract: No adrenal gland nodules. Cyst in the upper pole left kidney. Nephrograms are symmetrical and otherwise homogeneous. No hydronephrosis or hydroureter. Bladder is unremarkable. Stomach/Bowel: Stomach, small bowel, and colon are not abnormally distended. No wall thickening or inflammatory changes are apparent. Scattered diverticula in the sigmoid colon without evidence of diverticulitis. Appendix is normal. Vascular/Lymphatic: Aortic atherosclerosis. No enlarged abdominal or pelvic lymph nodes. Reproductive: Prostate is unremarkable. Other: No free air or free fluid in the abdomen. Abdominal wall musculature appears intact. Musculoskeletal: Degenerative changes in the spine. No destructive bone lesions. Review of the MIP images confirms the above findings. IMPRESSION: 1. No evidence of significant pulmonary embolus. 2. Small right pleural effusion. Patchy infiltrates in the right lung are nonspecific but could indicate early multifocal pneumonia. 3. No acute process demonstrated in the abdomen or pelvis. 4. Aortic atherosclerosis. Electronically Signed   By: Lucienne Capers M.D.   On: 02/14/2021 22:09   MR Brain W Wo Contrast  Result Date: 02/05/2021 CLINICAL DATA:  Follow-up CNS neoplasm EXAM: MRI HEAD WITHOUT AND WITH CONTRAST TECHNIQUE: Multiplanar, multiecho pulse sequences of the brain and surrounding  structures were obtained without and with intravenous contrast. CONTRAST:  72m MULTIHANCE GADOBENATE DIMEGLUMINE 529 MG/ML IV SOLN COMPARISON:  01/27/2021 FINDINGS: BRAIN New Lesions: 3 mm nodule at the high left and parasagittal frontal lobe seen on 12:147 Larger lesions: None. Stable or Smaller lesions: 6 mm mm enhancing lesion located in the lateral left cerebellum and seen on 11:40. Other Brain findings: Mild vasogenic edema around the left cerebellar metastasis. No acute infarct, acute hemorrhage, hydrocephalus, or collection. Vascular: Normal flow voids and vascular enhancements Skull and upper cervical spine: Normal marrow signal Sinuses/Orbits: Negative IMPRESSION: 1. 3 mm newly seen metastasis in the high left frontal lobe. 2. 6 mm unchanged left cerebellar metastasis. Electronically Signed   By: JMonte FantasiaM.D.   On: 02/05/2021 06:09   MR Brain W Wo Contrast  Result Date: 01/27/2021 CLINICAL DATA:  High-grade neuroendocrine carcinoma. Assess for metastatic disease. EXAM: MRI HEAD WITHOUT AND WITH CONTRAST TECHNIQUE: Multiplanar, multiecho pulse sequences of the brain and surrounding structures were obtained without and with intravenous contrast. CONTRAST:  826mGADAVIST GADOBUTROL 1 MMOL/ML IV SOLN COMPARISON:  02/23/2020 FINDINGS: Brain: Diffusion imaging does not show any acute or subacute infarction. Newly seen 6.5 mm ring-enhancing lesion in the left lateral cerebellum consistent with a small metastasis. Minimal adjacent edema. There is some susceptibility artifact associated with this metastasis likely indicating microhemorrhage. No second intracranial metastatic lesion. Minimal small vessel change affects the hemispheric white matter. No large vessel territory infarction. No hydrocephalus or extra-axial collection. Vascular: Major vessels at the base of the brain show flow. Skull and upper cervical spine: Negative Sinuses/Orbits: Clear/normal Other: None  IMPRESSION: 6.5 mm ring-enhancing  lesion of the left lateral cerebellum with some hemosiderin/blood products, consistent with a solitary metastasis. Minimal adjacent edema. No second lesion identified. Electronically Signed   By: Nelson Chimes M.D.   On: 01/27/2021 14:01   MR THORACIC SPINE WO CONTRAST  Result Date: 02/19/2021 CLINICAL DATA:  Initial evaluation for intervertebral disc disorder, ongoing back pain. EXAM: MRI THORACIC SPINE WITHOUT CONTRAST TECHNIQUE: Multiplanar, multisequence MR imaging of the thoracic spine was performed. No intravenous contrast was administered. COMPARISON:  Previous MRI from 09/28/2018. FINDINGS: Alignment: Physiologic with preservation of the normal thoracic kyphosis. No listhesis. Vertebrae: Vertebral body height well maintained without acute or chronic fracture. Bone marrow signal intensity within normal limits. No discrete or worrisome osseous lesions. No abnormal marrow edema. Susceptibility artifact from prior ACDF noted at C7-T1. Cord:  Normal signal and morphology. Paraspinal and other soft tissues: Paraspinous soft tissues within normal limits. Small to moderate layering left pleural effusion. Visualized visceral structures otherwise unremarkable. Disc levels: T1-2:  Unremarkable. T2-3: Unremarkable. T3-4:  Negative interspace.  Mild facet hypertrophy.  No stenosis. T4-5: Negative interspace. Mild right-sided facet hypertrophy. No stenosis. T5-6:  Negative interspace.  Mild facet hypertrophy.  No stenosis. T6-7:  Unremarkable. T7-8:  Unremarkable. T8-9: Right paracentral to subarticular disc protrusion indents the right ventral thecal sac (series 21, image 26). Mild flattening of the right ventral cord without cord signal changes or significant spinal stenosis. Superimposed right-sided reactive endplate spurring. Foramina remain patent. T9-10: Unremarkable. T10-11:  Unremarkable. T11-12: Minimal disc bulge. Mild facet hypertrophy. No canal or foraminal stenosis. T12-L1:  Unremarkable. IMPRESSION: 1.  Right paracentral disc protrusion at T8-9 with secondary mild flattening of the right ventral cord, but no cord signal changes or significant stenosis. 2. Mild multilevel facet hypertrophy throughout the thoracic spine as detailed above. Findings could contribute to back pain. 3. Small to moderate layering left pleural effusion. Electronically Signed   By: Jeannine Boga M.D.   On: 02/19/2021 01:54   MR LUMBAR SPINE WO CONTRAST  Result Date: 02/19/2021 CLINICAL DATA:  Initial evaluation for ongoing lower back pain. EXAM: MRI LUMBAR SPINE WITHOUT CONTRAST TECHNIQUE: Multiplanar, multisequence MR imaging of the lumbar spine was performed. No intravenous contrast was administered. COMPARISON:  Prior radiograph from 09/10/2014. FINDINGS: Segmentation: Standard. Lowest well-formed disc space labeled the L5-S1 level. Alignment: Trace retrolisthesis of L2 on L3. Alignment otherwise normal with preservation of the normal lumbar lordosis. Vertebrae: Vertebral body height well maintained without acute or chronic fracture. Bone marrow signal intensity within normal limits. 1.5 cm benign hemangioma noted within the L1 vertebral body. No other discrete or worrisome osseous lesions. No abnormal marrow edema. Conus medullaris and cauda equina: Conus extends to the L1 level. Conus and cauda equina appear normal. Paraspinal and other soft tissues: Paraspinous soft tissues within normal limits. 2.4 cm T2 hyperintense simple cyst noted within the interpolar left kidney. Visualized visceral structures otherwise unremarkable. Disc levels: L1-2: Degenerative intervertebral disc space narrowing with disc desiccation and diffuse disc bulge. Associated mild reactive endplate change. Minimal facet spurring. No significant spinal stenosis. Foramina remain patent. L2-3: Trace retrolisthesis. Degenerative intervertebral disc space narrowing with diffuse disc bulge and disc desiccation. Mild facet and ligament flavum hypertrophy. No  significant spinal stenosis. Foramina remain patent. L3-4: Degenerative intervertebral disc space narrowing with disc desiccation and diffuse disc bulge. Superimposed broad-based left foraminal to extraforaminal disc protrusion contacts the exiting left L4 nerve root as it courses of the left neural foramen (series 8, image 22). Mild  bilateral facet hypertrophy. No significant spinal stenosis. Mild left L3 foraminal narrowing. Right neural foramen remains patent. L4-5: Negative interspace. Mild to moderate bilateral facet hypertrophy. Borderline mild narrowing of the lateral recesses bilaterally. Central canal remains patent. Mild bilateral L4 foraminal stenosis. No frank impingement. L5-S1: Disc desiccation with mild disc bulge, slightly eccentric to the right. Associated right paracentral annular fissure at the level of the descending right S1 nerve root sheath. Mild bilateral facet hypertrophy. No canal or lateral recess stenosis. Foramina remain patent. No impingement. IMPRESSION: 1. Broad-based left foraminal to extraforaminal disc protrusion at L3-4, contacting and potentially irritating the exiting left L4 nerve root. 2. Right eccentric disc bulge with annular fissure at L5-S1, closely approximating and potentially irritating the descending right S1 nerve root. 3. Additional mild for age degenerative disc disease and facet hypertrophy elsewhere within the lumbar spine as above. No other significant stenosis or neural impingement. Electronically Signed   By: Jeannine Boga M.D.   On: 02/19/2021 02:02   CT ABDOMEN PELVIS W CONTRAST  Result Date: 02/14/2021 CLINICAL DATA:  Recent surgery. Cancer. Right flank pain. Tachycardia. Pulmonary embolus suspected with high probability. Recent diagnosis of UTI without improvement. History of lung cancer metastatic to brain. EXAM: CT ANGIOGRAPHY CHEST CT ABDOMEN AND PELVIS WITH CONTRAST TECHNIQUE: Multidetector CT imaging of the chest was performed using the  standard protocol during bolus administration of intravenous contrast. Multiplanar CT image reconstructions and MIPs were obtained to evaluate the vascular anatomy. Multidetector CT imaging of the abdomen and pelvis was performed using the standard protocol during bolus administration of intravenous contrast. CONTRAST:  122mL OMNIPAQUE IOHEXOL 350 MG/ML SOLN COMPARISON:  CT chest 12/12/2020.  CT abdomen and pelvis 02/11/2021 FINDINGS: CTA CHEST FINDINGS Cardiovascular: There is good opacification of the central and segmental pulmonary arteries. No focal filling defects. No evidence of significant pulmonary embolus. Ascending aortic aneurysm measuring 4 cm in diameter, unchanged. No aortic dissection. Great vessel origins are patent. Coronary artery and aortic calcification. Postoperative changes consistent with coronary bypass. Normal heart size. No pericardial effusions. Mediastinum/Nodes: Esophagus is decompressed. No significant lymphadenopathy in the chest. Scattered lymph nodes are not pathologically enlarged. Lungs/Pleura: Small right pleural effusion. Mild patchy areas of airspace disease in the right lung could represent early multifocal pneumonia. Left lung is clear. Previous right upper lung nodule has been resected in the interval. Musculoskeletal: Degenerative changes in the spine. Postoperative changes in the lower cervical spine. Sternotomy wires. Old rib fractures. Review of the MIP images confirms the above findings. CT ABDOMEN and PELVIS FINDINGS Hepatobiliary: Subcentimeter lesion in the dome of the liver likely representing a cavernous hemangioma. Gallbladder and bile ducts are unremarkable. Pancreas: Unremarkable. No pancreatic ductal dilatation or surrounding inflammatory changes. Spleen: Normal in size without focal abnormality. Adrenals/Urinary Tract: No adrenal gland nodules. Cyst in the upper pole left kidney. Nephrograms are symmetrical and otherwise homogeneous. No hydronephrosis or  hydroureter. Bladder is unremarkable. Stomach/Bowel: Stomach, small bowel, and colon are not abnormally distended. No wall thickening or inflammatory changes are apparent. Scattered diverticula in the sigmoid colon without evidence of diverticulitis. Appendix is normal. Vascular/Lymphatic: Aortic atherosclerosis. No enlarged abdominal or pelvic lymph nodes. Reproductive: Prostate is unremarkable. Other: No free air or free fluid in the abdomen. Abdominal wall musculature appears intact. Musculoskeletal: Degenerative changes in the spine. No destructive bone lesions. Review of the MIP images confirms the above findings. IMPRESSION: 1. No evidence of significant pulmonary embolus. 2. Small right pleural effusion. Patchy infiltrates in the right lung are nonspecific  but could indicate early multifocal pneumonia. 3. No acute process demonstrated in the abdomen or pelvis. 4. Aortic atherosclerosis. Electronically Signed   By: Lucienne Capers M.D.   On: 02/14/2021 22:09   CT ABDOMEN PELVIS W CONTRAST  Result Date: 02/11/2021 CLINICAL DATA:  Right flank pain and right upper quadrant tenderness. Kidney stones suspected. Recent surgery for cancer removal. First chemotherapy yesterday. EXAM: CT ABDOMEN AND PELVIS WITH CONTRAST TECHNIQUE: Multidetector CT imaging of the abdomen and pelvis was performed using the standard protocol following bolus administration of intravenous contrast. CONTRAST:  174mL OMNIPAQUE IOHEXOL 300 MG/ML  SOLN COMPARISON:  11/18/2020 FINDINGS: Lower chest: Small right pleural effusion. Hepatobiliary: No focal liver abnormality is seen. No gallstones, gallbladder wall thickening, or biliary dilatation. Pancreas: Unremarkable. No pancreatic ductal dilatation or surrounding inflammatory changes. Spleen: Normal in size without focal abnormality. Adrenals/Urinary Tract: No adrenal gland nodules. Small cyst on the left kidney. Nephrograms are symmetrical. No hydronephrosis or hydroureter. No renal or  ureteral stones identified. Bladder is unremarkable. Stomach/Bowel: Stomach, small bowel, and colon are not abnormally distended. No wall thickening or inflammatory changes are appreciated. Appendix is normal. Vascular/Lymphatic: Aortic atherosclerosis. No enlarged abdominal or pelvic lymph nodes. Reproductive: Prostate is unremarkable. Other: No free air or free fluid in the abdomen. Small focal subcutaneous emphysema in the anterior abdominal wall, likely injection site. No infiltration or collection in the subcutaneous fat to suggest postoperative complication. Musculoskeletal: No acute or significant osseous findings. IMPRESSION: 1. No renal or ureteral stone or obstruction. 2. No acute process demonstrated in the abdomen or pelvis. 3. Aortic atherosclerosis. 4. Small right pleural effusion. Electronically Signed   By: Lucienne Capers M.D.   On: 02/11/2021 01:13   DG Chest Port 1 View  Result Date: 02/24/2021 CLINICAL DATA:  Chest pain and shortness of breath. History of lung cancer. Question sepsis. EXAM: PORTABLE CHEST 1 VIEW COMPARISON:  01/07/2021 FINDINGS: Power port placed from a right internal jugular approach has its tip at the SVC RA junction. Previous median sternotomy and CABG. Bronchial valves present on the right. Previous lobectomy on the right. No sign of active infiltrate or collapse. There may be a small amount of sub pulmonic effusion on the right. IMPRESSION: Bronchial valves on the right. Small amount of sub pulmonic effusion on the right. No pneumonia visible by radiography. Electronically Signed   By: Nelson Chimes M.D.   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 He presents for port catheter placement to establish durable venous access. EXAM: IMPLANTED PORT A CATH PLACEMENT WITH ULTRASOUND AND FLUOROSCOPIC GUIDANCE MEDICATIONS: None. ANESTHESIA/SEDATION:  Versed 2 mg IV; Fentanyl 100 mcg IV; Moderate Sedation Time:  23 minutes The patient was continuously monitored during the procedure by the interventional radiology nurse under my direct supervision. FLUOROSCOPY TIME:  0 minutes, 18 seconds (6 mGy) COMPLICATIONS: None immediate. PROCEDURE: The right neck and chest was prepped with chlorhexidine, and draped in the usual sterile fashion using maximum barrier technique (cap and mask, sterile gown, sterile gloves, large sterile sheet, hand hygiene and cutaneous antiseptic). Local anesthesia was attained by infiltration with 1% lidocaine with epinephrine. Ultrasound demonstrated patency of the right internal jugular vein, and this was documented with an image. Under real-time ultrasound guidance, this vein was accessed with a 21 gauge micropuncture needle and image documentation was performed. A small dermatotomy was made at the access site with an 11 scalpel.  A 0.018" wire was advanced into the SVC and the access needle exchanged for a 87F micropuncture vascular sheath. The 0.018" wire was then removed and a 0.035" wire advanced into the IVC. An appropriate location for the subcutaneous reservoir was selected below the clavicle and an incision was made through the skin and underlying soft tissues. The subcutaneous tissues were then dissected using a combination of blunt and sharp surgical technique and a pocket was formed. A single lumen power injectable portacatheter was then tunneled through the subcutaneous tissues from the pocket to the dermatotomy and the port reservoir placed within the subcutaneous pocket. The venous access site was then serially dilated and a peel away vascular sheath placed over the wire. The wire was removed and the port catheter advanced into position under fluoroscopic guidance. The catheter tip is positioned in the superior cavoatrial junction. This was documented with a spot image. The portacatheter was then tested and found to flush and  aspirate well. The port was flushed with saline followed by 100 units/mL heparinized saline. The pocket was then closed in two layers using first subdermal inverted interrupted absorbable sutures followed by a running subcuticular suture. The epidermis was then sealed with Dermabond. The dermatotomy at the venous access site was also closed with Dermabond. IMPRESSION: Successful placement of a right IJ approach Power Port with ultrasound and fluoroscopic guidance. The catheter is ready for use. Electronically Signed   By: Jacqulynn Cadet M.D.   On: 02/06/2021 14:33   LONG TERM MONITOR-LIVE TELEMETRY (3-14 DAYS)  Result Date: 02/09/2021  Normal sinus rhythm with rare PACs and PVCs.  Brief, string of PACs not associated with symptoms.  No sustained pathologic arrhythmias.  Patch Wear Time:  14 days and 0 hours (2022-05-14T12:06:36-0400 to 2022-05-28T12:06:36-0400) Patient had a min HR of 45 bpm, max HR of 203 bpm, and avg HR of 82 bpm. Predominant underlying rhythm was Sinus Rhythm. 1 run of Supraventricular Tachycardia occurred lasting 6 beats with a max rate of 203 bpm (avg 199 bpm). Isolated SVEs were rare (<1.0%), SVE Couplets were rare (<1.0%), and SVE Triplets were rare (<1.0%). Isolated VEs were rare (<1.0%), and no VE Couplets or VE Triplets were present.   US Abdomen Limited RUQ (LIVER/GB)  Result Date: 02/11/2021 CLINICAL DATA:  Right upper quadrant pain EXAM: ULTRASOUND ABDOMEN LIMITED RIGHT UPPER QUADRANT COMPARISON:  10/24/2020 FINDINGS: Gallbladder: No gallstones or wall thickening visualized. No sonographic Murphy sign noted by sonographer. Common bile duct: Diameter: Normal caliber, 4 mm Liver: No focal lesion identified. Within normal limits in parenchymal echogenicity. Portal vein is patent on color Doppler imaging with normal direction of blood flow towards the liver. Other: None. IMPRESSION: No acute findings. Electronically Signed   By: Rolm Baptise M.D.   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 Request received for diagnostic and therapeutic right thoracentesis. EXAM: ULTRASOUND GUIDED DIAGNOSTIC AND THERAPEUTIC RIGHT THORACENTESIS MEDICATIONS: 1% lidocaine to skin and subcutaneous tissue COMPLICATIONS: None immediate. PROCEDURE: An ultrasound guided thoracentesis was thoroughly discussed with the patient and questions answered. The benefits, risks, alternatives and complications were also discussed. The patient understands and wishes to proceed with the procedure. Written consent was obtained. Ultrasound was performed to localize and mark an adequate  pocket of fluid in the right chest. The area was then prepped and draped in the normal sterile fashion. 1% Lidocaine was used for local anesthesia. Under ultrasound guidance a 6 Fr Safe-T-Centesis catheter was introduced. Thoracentesis was performed. The catheter was removed and a dressing applied. FINDINGS: A total of approximately 220 cc of hazy, amber fluid was removed. Samples were sent to the laboratory as requested by the clinical team. IMPRESSION: Successful ultrasound guided diagnostic and therapeutic right thoracentesis yielding 220 cc of pleural fluid. Read by: Rowe Robert, PA-C Electronically Signed   By: Aletta Edouard M.D.   On: 02/25/2021 14:51    ASSESSMENT AND PLAN: This is a very pleasant 65 year old Caucasian male diagnosed with stage IV (pT1c, N1, M1c) large cell neuroendocrine carcinoma.  He presented with a right upper lobe lung nodule and hilar lymphadenopathy.   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 His PD-L1 expression is 0%.  He was diagnosed in April 2022. His foundation one testing is negative  for any actionable mutations.     The patient completed SRS to the small metastatic brain lesion under the care of Dr. Lisbeth Renshaw on 02/13/2021.   The patient is currently undergoing adjuvant chemotherapy with cisplatin 60 mg per metered squared, on day 1, etoposide 120 mg per metered squared on days 1, 2, and 3 IV every 3 weeks. He received his first dose on 02/09/21. He tolerated it fairly well with the exception of fatigue and some nausea/vomiting.  Following his first cycle of chemotherapy, he was seen in the emergency department on 5/18 and blood cultures drawn on that date showed staph species in 1 set.  He was admitted to the hospital for further evaluation of his positive blood culture.  He was discharged home.  Now admitted due to dyspnea and neutropenia.  The patient is status postthoracentesis 6/29.  His breathing remains stable at this time.  Cytology is currently pending and we will follow-up on these results.  CBC from today has been reviewed.  His WBC is 2.2 but ANC is only 0.2.  He is afebrile.  Cultures are negative to date.  Recommend starting Granix 480 mcg subcu daily.  I have ordered a dose for today and again for 7/1.  We discussed the potential for increased arthralgias related to Granix.  Recommend rechecking CBC with differential in the a.m. would like to get ANC to 1.5 or higher.  The total time spent in the visit was 30 minutes. Greater than 50%  of this time was spent counseling and coordinating care related to the above assessment and plan.     LOS: 1 day   Mikey Bussing, DNP, AGPCNP-BC, AOCNP 02/26/21

## 2021-02-26 NOTE — Progress Notes (Signed)
Initial Nutrition Assessment  INTERVENTION:   -Ensure Enlive po BID, each supplement provides 350 kcal and 20 grams of protein  NUTRITION DIAGNOSIS:   Increased nutrient needs related to cancer and cancer related treatments as evidenced by estimated needs.  GOAL:   Patient will meet greater than or equal to 90% of their needs  MONITOR:   PO intake, Supplement acceptance, Labs, Weight trends, I & O's  REASON FOR ASSESSMENT:   Malnutrition Screening Tool    ASSESSMENT:   65 y.o. male, with history of stage IV large cell neuroendocrine carcinoma of right lung with brain metastasis, s/p lobectomy, currently on chemotherapy and radiation to brain, CAD s/p CABG in 2017 and DES in 7282, diastolic CHF, last echo from April 2022 showed grade 1 diastolic dysfunction, history of CVA, paroxysmal atrial fibrillation not on anticoagulation was just discharged from the hospital on 02/19/2021 after he was admitted for positive blood culture.  6/29: s/p thoracentesis  Patient currently consuming 100% of meals. Drinking Ensure supplements. Reports a good appetite at this time.   Per weight records, pt has lost 8 lbs since 6/20 (4% wt loss x 10 days, significant for time frame).   I/Os: +1L since admit UOP: 1725 ml x 24 hrs  Medications: Miralax  Labs reviewed.  Diet Order:   Diet Order             Diet Heart Room service appropriate? Yes; Fluid consistency: Thin  Diet effective now                   EDUCATION NEEDS:   No education needs have been identified at this time  Skin:  Skin Assessment: Reviewed RN Assessment  Last BM:  6/28  Height:   Ht Readings from Last 1 Encounters:  02/24/21 5\' 9"  (1.753 m)    Weight:   Wt Readings from Last 1 Encounters:  02/24/21 83.9 kg    BMI:  Body mass index is 27.32 kg/m.  Estimated Nutritional Needs:   Kcal:  2500-2700  Protein:  120-130g  Fluid:  2L/day   Clayton Bibles, MS, RD, LDN Inpatient Clinical  Dietitian Contact information available via Amion

## 2021-02-26 NOTE — Progress Notes (Signed)
[facility] PROGRESS NOTE    Progress Note  [name]  TFT:[id number] DOB: 1955/10/12 DOA: 02/24/2021 PCP: [name], MD     Brief Narrative:   [name] is an 66 y.o. 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   Assessment/Plan:   Dyspnea on exertion/Pleuresy: Status post thoracocentesis. He relates his flank pain is improved continues to saturate greater than 90% on room air. Gram stain and cytology of pleural fluid are pending.  Neutropenia: Continue IV cefepime and vancomycin. Positive blood cultures 1 out of 2 on 02/14/2021, repeated BC on 6.20.2022 negative. BC on 6.29.2022 negative till date. Cram stain of pleural effusion has remained negative.  Chronic right flank pain herniated disc at L3-L4: Patient has a follow-up appointment with [name] as an outpatient, continue oral narcotics.    Acute kidney injury on chronic kidney disease stage IIIb: Likely prerenal azotemia you started on IV fluids and his creatinine is improving.  Continue IV fluids for an additional 24 hours.  Paroxysmal atrial fibrillation: Patient is in sinus rhythm, continue metoprolol and amiodarone. Not on anticoagulation follow-up with cardiology as an outpatient.  History of CVA No residual deficits continue aspirin and Plavix.  History of ischemic cardiomyopathy/chronic diastolic heart failure: With a last 2D echo showing an EF of 55%, asymptomatic.  Neuroendocrine lung carcinoma with brain metastases: Postchemotherapy and radiation therapy  with neutropenia.  He was discussed with Dr. [name] at that time recommended no further treatment.  Narcotic seeking behavior: Avoid narcotics and benzodiazepines.   DVT prophylaxis: lovenox Family Communication:none Status is: Observation  The patient will require care spanning > 2 midnights and should be moved to inpatient because: Hemodynamically unstable  Dispo: The patient is from: Home              Anticipated d/c is to: Home              Patient currently is not medically stable to d/c.   Difficult to place patient No        Code Status:     Code Status Orders  (From admission, onward)           Start     Ordered   02/24/21 1724  Full code  Continuous        02/24/21 1723           Code Status History     Date Active Date Inactive Code Status Order ID Comments User Context   02/16/2021 1907 02/19/2021 2234 Full Code [id number]  Mercy Riding, MD ED   12/24/2020 1248 01/07/2021 1755 Full Code [id number]  Antony Odea, PA-C Inpatient   11/07/2019 1741 11/08/2019 1754 Full Code [id number]  [name], PA-C Inpatient   04/11/2019 1644 04/12/2019 1531 Full Code [id number]  [name], MD Inpatient   10/17/2018 1539 10/18/2018 1725 Full Code [id number]  [name] Inpatient   03/24/2018 2247 03/26/2018 1818 Full Code [id number]  [name], MD Inpatient   03/24/2018 1553 03/24/2018 2247 Full Code [id number]  [name], MD ED   02/07/2018 1202 02/09/2018  2055 Full Code [id number]  [name], [place] [facility]   11/13/2017 0727 11/13/2017 1608 Full Code [id number]  [name], PA-C ED   10/04/2017 1707 10/06/2017 1624 Full Code [id number]  [name], DO ED   06/21/2017 1455 06/22/2017 2228 Full Code [id number]  [name], NP ED   03/10/2017 1219 03/13/2017 1440 Full Code [id number]  [name], NP ED   01/04/2017 1955 01/05/2017 2032 Full Code [id number]  [name], MD Inpatient   11/09/2016 1045  11/10/2016 2007 Full Code [id number]  [name], MD ED   09/07/2016 1822 09/09/2016 1835 Full Code [id number]  [name], NP Inpatient   06/10/2016 2358 06/15/2016 1737 Full Code [id number]  [name], MD Inpatient   04/19/2016 1854 04/21/2016 2044 Full Code [id number]  [name], NP Inpatient   03/07/2015 1755 03/08/2015 1441 Full Code [id number]  [name], MD Inpatient   11/19/2012 0037 11/20/2012 2131 Full Code [id number]  [name]., DO Inpatient         IV Access:   Peripheral IV   Procedures and diagnostic studies:   DG Chest 1 View  Result Date: 02/25/2021 CLINICAL DATA:  Post thoracentesis EXAM: CHEST  1 VIEW COMPARISON:  02/24/2021 radiograph and chest CT FINDINGS: Post sternotomy changes. Right-sided central venous catheter tip over the proximal right atrium. Trace right-sided pleural effusion. No visible pneumothorax. Stable cardiomediastinal silhouette with aortic atherosclerosis. Surgical hardware at the thoracic inlet region. IMPRESSION: 1. Trace right pleural effusion without visible pneumothorax post thoracentesis. 2. No significant changes. Electronically Signed   By: Donavan Foil M.D.   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 Request received for diagnostic and therapeutic right thoracentesis. EXAM: ULTRASOUND GUIDED DIAGNOSTIC AND THERAPEUTIC RIGHT THORACENTESIS MEDICATIONS: 1% lidocaine to skin and subcutaneous tissue COMPLICATIONS: None immediate. PROCEDURE: An ultrasound guided thoracentesis was thoroughly discussed with the patient and questions answered. The benefits, risks, alternatives and complications were also discussed. The patient understands and  wishes to proceed with the procedure. Written consent was obtained. Ultrasound was performed to localize and mark an adequate pocket of fluid in the right chest. The area was then prepped and draped in the normal sterile fashion. 1% Lidocaine was used for local anesthesia. Under ultrasound guidance a 6 Fr Safe-T-Centesis catheter was introduced. Thoracentesis was performed. The catheter was removed and a dressing applied. FINDINGS: A total of approximately 220 cc of hazy, amber fluid was removed. Samples were sent to the laboratory as requested by the clinical team. IMPRESSION: Successful ultrasound guided diagnostic and therapeutic right thoracentesis yielding 220 cc of pleural fluid. Read by: Rowe Robert, PA-C Electronically Signed   By: Aletta Edouard M.D.   On: 02/25/2021 14:51     Medical Consultants:   None.   Subjective:    MICAH BARNIER relates his flank pain is better.  Objective:    Vitals:   02/25/21 1505 02/25/21 2105 02/26/21 0328 02/26/21 0812  BP: 109/68 104/70 116/61   Pulse: 83 82 70 76  Resp: 20 15 15 20   Temp: 98.2 F (36.8 C) 98.1 F (36.7 C) 97.9 F (36.6 C)   TempSrc: Oral Oral Oral   SpO2: 100% 98% 99% 95%  Weight:      Height:       SpO2: 95 %   Intake/Output Summary (Last 24 hours) at 02/26/2021 1044 Last data filed at 02/26/2021 0951 Gross per 24 hour  Intake 2342.33 ml  Output 1275 ml  Net 1067.33 ml    Filed Weights   02/24/21 0741  Weight: 83.9 kg    Exam: General exam: In no acute distress. Respiratory system: Good air movement and clear to auscultation. Cardiovascular system: S1 & S2 heard, RRR. No JVD. Gastrointestinal system: Abdomen is nondistended, soft and nontender.  Extremities: No pedal edema. Skin: No rashes, lesions or ulcers  Data Reviewed:    Labs: Basic Metabolic Panel: Recent Labs  Lab 02/24/21 0821 02/24/21 1759 02/25/21 0514 02/26/21 0519  NA 137  --  137 136  K 4.6  --  4.0 4.0  CL 103  --  101 102   CO2 26  --  28 27  GLUCOSE 110*  --  106* 111*  BUN 20  --  20 20  CREATININE 1.56* 1.54* 1.93* 1.60*  CALCIUM 9.3  --  9.2 8.7*    GFR Estimated Creatinine Clearance: 46 mL/min (A) (by C-G formula based on SCr of 1.6 mg/dL (H)). Liver Function Tests: Recent Labs  Lab 02/24/21 0821 02/25/21 0514  AST 19 17  ALT 21 20  ALKPHOS 69 67  BILITOT 0.4 0.4  PROT 6.8 6.4*  ALBUMIN 3.8 3.6    No results for input(s): LIPASE, AMYLASE in the last 168 hours. No results for input(s): AMMONIA in the last 168 hours. Coagulation profile Recent Labs  Lab 02/24/21 0917  INR 1.1    COVID-19 Labs  No results for input(s): DDIMER, FERRITIN, LDH, CRP in the last 72 hours.  Lab Results  Component Value Date   SARSCOV2NAA NEGATIVE 02/24/2021   SARSCOV2NAA NEGATIVE 02/16/2021   [place] NEGATIVE 02/14/2021   [place] NEGATIVE 12/22/2020    CBC: Recent Labs  Lab 02/24/21 0821 02/24/21 1759 02/25/21 0514  WBC 1.5* 2.5* 2.0*  NEUTROABS 0.1*  --   --   HGB 11.1* 11.4* 10.4*  HCT 33.7* 34.8* 33.6*  MCV 90.3 90.4 92.3  PLT 207 228 193    Cardiac Enzymes: No results for input(s): CKTOTAL, CKMB, CKMBINDEX, TROPONINI in the last 168 hours. BNP (last 3 results) No results for input(s): PROBNP in the last 8760 hours. CBG: No results for input(s): GLUCAP in the last 168 hours. D-Dimer: No results for input(s): DDIMER in the last 72 hours. Hgb A1c: No results for input(s): HGBA1C in the last 72 hours. Lipid Profile: No results for input(s): CHOL, HDL, LDLCALC, TRIG, CHOLHDL, LDLDIRECT in the last 72 hours. Thyroid function studies: No results for input(s): TSH, T4TOTAL, T3FREE, THYROIDAB in the last 72 hours.  Invalid input(s): FREET3 Anemia work up: No results for input(s): VITAMINB12, FOLATE, FERRITIN, TIBC, IRON, RETICCTPCT in the last 72 hours. Sepsis Labs: Recent Labs  Lab 02/24/21 0821 02/24/21 1759 02/25/21 0514  WBC 1.5* 2.5* 2.0*  LATICACIDVEN 1.4  --   --      Microbiology Recent Results (from the past 240 hour(s))  Culture, blood (Routine X 2) w Reflex to ID Panel     Status: None   Collection Time: 02/16/21  5:36 PM   Specimen: Chest; Blood  Result Value Ref Range Status   Specimen Description   Final    CHEST RIGHT Performed at [facility] [address] [place], [place] [address]  Special Requests   Final    BOTTLES DRAWN AEROBIC AND ANAEROBIC Blood Culture adequate volume Performed at [facility] [address], [place], [place] [address]    Culture   Final    NO GROWTH 5 DAYS Performed at [facility], [place] [address], [place], [place] [address]    Report Status 02/21/2021 FINAL  Final  Culture, blood (Routine X 2) w Reflex to ID Panel     Status: None   Collection Time: 02/16/21  5:36 PM   Specimen: Chest; Blood  Result Value Ref Range Status   Specimen Description   Final    CHEST RIGHT Performed at [facility] [address] [place], [place] [address]    Special Requests   Final    BOTTLES DRAWN AEROBIC AND ANAEROBIC Blood Culture adequate volume Performed at [facility] [address] [place], [place] [address]    Culture   Final    NO GROWTH 5 DAYS Performed at [facility], [place] [address], [place], [place] [address]    Report Status 02/21/2021 FINAL  Final  Resp Panel by RT-PCR (Flu A&B, Covid) Nasopharyngeal Swab     Status: None   Collection Time: 02/16/21  5:59 PM   Specimen: Nasopharyngeal Swab; Nasopharyngeal(NP) swabs in vial transport medium  Result Value Ref Range Status   SARS Coronavirus 2 by RT PCR NEGATIVE NEGATIVE Final    Comment: (NOTE) SARS-CoV-2 target nucleic acids are NOT DETECTED.  The SARS-CoV-2 RNA is generally detectable in upper respiratory specimens during the acute phase of infection. The lowest concentration of SARS-CoV-2 viral copies this assay can detect is 138 copies/mL. A  negative result does not preclude SARS-Cov-2 infection and should not be used as the sole basis for treatment or other patient management decisions. A negative result may occur with  improper specimen collection/handling, submission of specimen other than nasopharyngeal swab, presence of viral mutation(s) within the areas targeted by this assay, and inadequate number of viral copies(<138 copies/mL). A negative result must be combined with clinical observations, patient history, and epidemiological information. The expected result is Negative.  Fact Sheet for Patients:  EntrepreneurPulse.com.au  Fact Sheet for Healthcare Providers:  IncredibleEmployment.be  This test is no t yet approved or cleared by the [place] FDA and  has been authorized for detection and/or diagnosis of SARS-CoV-2 by FDA under an Emergency Use Authorization (EUA). This EUA will remain  in effect (meaning this test can be used) for the duration of the COVID-19 declaration under Section 564(b)(1) of the Act, 21 U.S.C.section 360bbb-3(b)(1), unless the authorization is terminated  or revoked sooner.       Influenza A by PCR NEGATIVE NEGATIVE Final   Influenza B by PCR NEGATIVE NEGATIVE Final    Comment: (NOTE) The Xpert Xpress SARS-CoV-2/FLU/RSV plus assay is intended as an aid in the diagnosis of influenza from Nasopharyngeal swab specimens and should not be used as a sole basis for treatment. Nasal washings and aspirates are unacceptable for Xpert Xpress SARS-CoV-2/FLU/RSV testing.  Fact Sheet for Patients: EntrepreneurPulse.com.au  Fact Sheet for Healthcare Providers: IncredibleEmployment.be  This test is not yet approved or cleared by the [place] FDA and has been authorized for detection and/or diagnosis of SARS-CoV-2 by FDA under an Emergency Use Authorization (EUA). This EUA will remain in effect (meaning this test can  be used) for the duration of the COVID-19 declaration under Section 564(b)(1) of the Act, 21 U.S.C. section  360bbb-3(b)(1), unless the authorization is terminated or revoked.  Performed at [facility], [place] [address] [place], [place] [address]   Urine culture     Status: None   Collection Time: 02/24/21  8:21 AM   Specimen: In/Out Cath Urine  Result Value Ref Range Status   Specimen Description   Final    IN/OUT CATH URINE Performed at [facility], [address] [place], [place] [address]    Special Requests   Final    NONE Performed at [facility], [address] [place], [place] [address]    Culture   Final    NO GROWTH Performed at [facility], [place] [address] [place], [place] [address]    Report Status 02/26/2021 FINAL  Final  Blood culture (routine single)     Status: None (Preliminary result)   Collection Time: 02/24/21  8:35 AM   Specimen: BLOOD RIGHT ARM  Result Value Ref Range Status   Specimen Description   Final    BLOOD RIGHT ARM Performed at [facility], [address] [place], [place] [address]    Special Requests   Final    BOTTLES DRAWN AEROBIC AND ANAEROBIC Blood Culture adequate volume Performed at [facility], [address] [place], [place] [address]    Culture   Final    NO GROWTH 2 DAYS Performed at [facility], [place] [address], [place], [place] [address]    Report Status PENDING  Incomplete  Resp Panel by RT-PCR (Flu A&B, Covid) Nasopharyngeal Swab     Status: None   Collection Time: 02/24/21  8:53 AM   Specimen: Nasopharyngeal Swab; Nasopharyngeal(NP) swabs in vial transport medium  Result Value Ref Range Status   SARS Coronavirus 2 by RT PCR NEGATIVE NEGATIVE Final    Comment: (NOTE) SARS-CoV-2 target nucleic acids are NOT DETECTED.  The SARS-CoV-2 RNA is generally detectable in upper respiratory specimens during the acute phase of infection. The  lowest concentration of SARS-CoV-2 viral copies this assay can detect is 138 copies/mL. A negative result does not preclude SARS-Cov-2 infection and should not be used as the sole basis for treatment or other patient management decisions. A negative result may occur with  improper specimen collection/handling, submission of specimen other than nasopharyngeal swab, presence of viral mutation(s) within the areas targeted by this assay, and inadequate number of viral copies(<138 copies/mL). A negative result must be combined with clinical observations, patient history, and epidemiological information. The expected result is Negative.  Fact Sheet for Patients:  EntrepreneurPulse.com.au  Fact Sheet for Healthcare Providers:  IncredibleEmployment.be  This test is no t yet approved or cleared by the [place] FDA and  has been authorized for detection and/or diagnosis of SARS-CoV-2 by FDA under an Emergency Use Authorization (EUA). This EUA will remain  in effect (meaning this test can be used) for the duration of the COVID-19 declaration under Section 564(b)(1) of the Act, 21 U.S.C.section 360bbb-3(b)(1), unless the authorization is terminated  or revoked sooner.       Influenza A by PCR NEGATIVE NEGATIVE Final   Influenza B by PCR NEGATIVE NEGATIVE Final    Comment: (NOTE) The Xpert Xpress SARS-CoV-2/FLU/RSV plus assay is intended as an aid in the diagnosis of influenza from Nasopharyngeal swab specimens and should not be used as a sole basis for treatment. Nasal washings and aspirates are unacceptable for Xpert Xpress SARS-CoV-2/FLU/RSV testing.  Fact  Sheet for Patients: EntrepreneurPulse.com.au  Fact Sheet for Healthcare Providers: IncredibleEmployment.be  This test is not yet approved or cleared by the [place] FDA and has been authorized for detection and/or diagnosis of SARS-CoV-2 by FDA under  an Emergency Use Authorization (EUA). This EUA will remain in effect (meaning this test can be used) for the duration of the COVID-19 declaration under Section 564(b)(1) of the Act, 21 U.S.C. section 360bbb-3(b)(1), unless the authorization is terminated or revoked.  Performed at [facility], [address] [place], [place] [address]   Culture, blood (single)     Status: None (Preliminary result)   Collection Time: 02/24/21  1:17 PM   Specimen: Left Antecubital; Blood  Result Value Ref Range Status   Specimen Description   Final    LEFT ANTECUBITAL Performed at [facility], [address] [place], [place] [address]    Special Requests   Final    BOTTLES DRAWN AEROBIC AND ANAEROBIC Blood Culture adequate volume Performed at [facility], [address] [place], [place] [address]    Culture   Final    NO GROWTH 2 DAYS Performed at [facility], [place] [address] [place], [place] [address]    Report Status PENDING  Incomplete  Gram stain     Status: None   Collection Time: 02/25/21  3:07 PM   Specimen: Lung, Right; Pleural Fluid  Result Value Ref Range Status   Specimen Description   Final    PLEURAL RIGHT Performed at [facility] [address] [place], [place] [address]    Special Requests   Final    NONE Performed at [facility], [place] [address], [place], [place] [address]    Gram Stain   Final    RARE WBC PRESENT, PREDOMINANTLY MONONUCLEAR NO ORGANISMS SEEN Performed at [facility], [place] [address], [place], [place] [address]    Report Status 02/26/2021 FINAL  Final     Medications:    amiodarone  200 mg Oral Daily   amLODipine  10 mg Oral Daily   aspirin EC  81 mg Oral q AM   atorvastatin  80 mg Oral Daily   clopidogrel  75 mg Oral Daily   enoxaparin (LOVENOX) injection  40 mg Subcutaneous Q24H   feeding supplement  237 mL Oral BID BM   ipratropium-albuterol  3 mL  Nebulization TID   metoprolol succinate  25 mg Oral Daily   ranolazine  1,000 mg Oral BID   sodium chloride flush  3 mL Intravenous Q12H   Continuous Infusions:  sodium chloride     sodium chloride 100 mL/hr at 02/26/21 0230   ceFEPime (MAXIPIME) IV 2 g (02/26/21 0055)   methocarbamol (ROBAXIN) IV 500 mg (02/26/21 0851)   ondansetron (ZOFRAN) IV 8 mg (02/25/21 2102)   vancomycin Stopped (02/25/21 1700)      LOS: 1 day   Charlynne Cousins  [facility]  02/26/2021, 10:44 AM

## 2021-02-27 ENCOUNTER — Ambulatory Visit: Payer: Medicare Other | Admitting: Thoracic Surgery (Cardiothoracic Vascular Surgery)

## 2021-02-27 ENCOUNTER — Ambulatory Visit: Payer: Medicare Other

## 2021-02-27 DIAGNOSIS — I480 Paroxysmal atrial fibrillation: Secondary | ICD-10-CM

## 2021-02-27 LAB — CBC WITH DIFFERENTIAL/PLATELET
Abs Immature Granulocytes: 0.26 10*3/uL — ABNORMAL HIGH (ref 0.00–0.07)
Basophils Absolute: 0 10*3/uL (ref 0.0–0.1)
Basophils Relative: 1 %
Eosinophils Absolute: 0.1 10*3/uL (ref 0.0–0.5)
Eosinophils Relative: 1 %
HCT: 33.2 % — ABNORMAL LOW (ref 39.0–52.0)
Hemoglobin: 10.5 g/dL — ABNORMAL LOW (ref 13.0–17.0)
Immature Granulocytes: 5 %
Lymphocytes Relative: 25 %
Lymphs Abs: 1.5 10*3/uL (ref 0.7–4.0)
MCH: 29.7 pg (ref 26.0–34.0)
MCHC: 31.6 g/dL (ref 30.0–36.0)
MCV: 93.8 fL (ref 80.0–100.0)
Monocytes Absolute: 0.9 10*3/uL (ref 0.1–1.0)
Monocytes Relative: 16 %
Neutro Abs: 3.1 10*3/uL (ref 1.7–7.7)
Neutrophils Relative %: 52 %
Platelets: 240 10*3/uL (ref 150–400)
RBC: 3.54 MIL/uL — ABNORMAL LOW (ref 4.22–5.81)
RDW: 12.7 % (ref 11.5–15.5)
WBC: 5.8 10*3/uL (ref 4.0–10.5)
nRBC: 0 % (ref 0.0–0.2)

## 2021-02-27 LAB — BASIC METABOLIC PANEL
Anion gap: 7 (ref 5–15)
BUN: 16 mg/dL (ref 8–23)
CO2: 25 mmol/L (ref 22–32)
Calcium: 8.8 mg/dL — ABNORMAL LOW (ref 8.9–10.3)
Chloride: 105 mmol/L (ref 98–111)
Creatinine, Ser: 1.5 mg/dL — ABNORMAL HIGH (ref 0.61–1.24)
GFR, Estimated: 51 mL/min — ABNORMAL LOW (ref >60)
Glucose, Bld: 113 mg/dL — ABNORMAL HIGH (ref 70–99)
Potassium: 4.3 mmol/L (ref 3.5–5.1)
Sodium: 137 mmol/L (ref 135–145)

## 2021-02-27 MED ORDER — FLUTICASONE PROPIONATE 50 MCG/ACT NA SUSP: 2.0000 | Freq: Every day | NASAL | Status: DC | PRN

## 2021-02-27 MED ORDER — POLYETHYLENE GLYCOL 3350 17 G PO PACK: 17.0000 g | PACK | Freq: Every day | ORAL | 0 refills | Status: DC | PRN

## 2021-02-27 MED ORDER — LIDOCAINE-PRILOCAINE 2.5-2.5 % EX CREA
1.0000 | TOPICAL_CREAM | Freq: Once | CUTANEOUS | Status: DC | PRN
Start: 2021-02-27 — End: 2021-08-30

## 2021-02-27 MED FILL — Dexamethasone Sodium Phosphate Inj 100 MG/10ML: INTRAMUSCULAR | Qty: 1 | Status: CN

## 2021-02-27 MED FILL — Dexamethasone Sodium Phosphate Inj 100 MG/10ML: INTRAMUSCULAR | Qty: 1 | Status: AC

## 2021-02-27 MED FILL — Fosaprepitant Dimeglumine For IV Infusion 150 MG (Base Eq): INTRAVENOUS | Qty: 5 | Status: AC

## 2021-02-27 NOTE — Progress Notes (Signed)
Pt discharged to home with wife. Discharge instructions and medication education provided to pt.

## 2021-02-27 NOTE — Progress Notes (Signed)
Brief oncology note:  CBC from this morning has been reviewed.  WBC and ANC have now normalized after receiving 1 dose of Granix.  I have discontinued Granix scheduled for today.  He remains afebrile and cultures remain negative to date.  He is stable for discharge from our standpoint if otherwise medically stable - will defer to hospitalist. He already has outpatient follow-up scheduled at the cancer center on 7/5 and he should keep this appointment.  CBC    Component Value Date/Time   WBC 5.8 02/27/2021 0448   RBC 3.54 (L) 02/27/2021 0448   HGB 10.5 (L) 02/27/2021 0448   HGB 11.2 (L) 02/16/2021 1425   HCT 33.2 (L) 02/27/2021 0448   PLT 240 02/27/2021 0448   PLT 221 02/16/2021 1425   MCV 93.8 02/27/2021 0448   MCH 29.7 02/27/2021 0448   MCHC 31.6 02/27/2021 0448   RDW 12.7 02/27/2021 0448   LYMPHSABS 1.5 02/27/2021 0448   MONOABS 0.9 02/27/2021 0448   EOSABS 0.1 02/27/2021 0448   BASOSABS 0.0 02/27/2021 0448   Mikey Bussing, DNP, AGPCNP-BC, AOCNP

## 2021-02-27 NOTE — Discharge Summary (Signed)
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 fib not on anticoagulation, recently discharged from the hospital for acute kidney injury and low back pain and was found to have L3-L4 disc protrusion for which neurosurgery recommended outpatient follow-up presented with dyspnea on exertion.  On presentation, he was found to be neutropenic and was started on broad-spectrum antibiotics.  During the hospitalization, he underwent right-sided thoracentesis and removal of 220 cc of pleural fluid.  Cultures have been negative so far. He received Granix per oncology for neutropenia.  Subsequently, his WBC has normalized.  Oncology has cleared the patient for discharge.  He will be discharged home today with no need for further antibiotics.  Outpatient follow-up with oncology next week.  Discharge Diagnoses:   Neutropenia -Patient was empirically treated with broad-spectrum antibiotics.  Received Granix as per oncology during the hospitalization.  Today, neutropenia has resolved.  Oncology has cleared the patient for discharge  Dyspnea on exertion/pleural effusion right-sided -CTA was negative for PE but showed moderate right-sided pleural  effusion.  Not hypoxic.  Status post thoracentesis and removal of 220 cc of pleural fluid.  Cultures negative so far. -Respiratory status is improved.  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.  Pain management as per PCP  Acute kidney injury on chronic any disease stage IIIb -Creatinine improved and stable.  Outpatient follow-up  Paroxysmal A. Fib -Currently rate controlled.  Continue metoprolol and amiodarone.  Not on anticoagulation; outpatient follow-up with cardiology  History of unspecified CVA -Continue aspirin and Plavix  Chronic diastolic heart failure -Currently stable.  Last 2D echo showed EF of 55%.  Outpatient follow-up with cardiology.  Continue metoprolol  Neuroendocrine lung carcinoma with brain metastases -Completed radiation therapy to the brain lesion.  Currently undergoing adjuvant chemotherapy.  Outpatient follow-up with oncology.  Discharge Instructions  Discharge Instructions     Diet - low sodium heart healthy   Complete by: As directed    Increase activity slowly   Complete by: As directed       Allergies as of 02/27/2021       Reactions   Prednisone Other (See Comments)   States that this med makes him "crazy"   Tetanus Toxoids Swelling, Other (See Comments)   Fever, Swelling of the arm    Wellbutrin [bupropion] Other (See Comments)   Crazy thoughts, nightmares   Indomethacin Other (See Comments)   rectal bleeding   Other    Other reaction(s): Unknown   Scallops [shellfish Allergy] Nausea Only   Varenicline Other (See Comments)   Dreams Other reaction(s): Unknown Other reaction(s): Unknown        Medication List     STOP taking these medications    escitalopram 10 MG tablet Commonly known as: LEXAPRO       TAKE these  medications    acetaminophen 500 MG tablet Commonly known as: TYLENOL Take 1,000 mg by mouth every 6 (six) hours as needed for moderate pain.    albuterol 108 (90 Base) MCG/ACT inhaler Commonly known as: VENTOLIN HFA Inhale 2 puffs into the lungs every 6 (six) hours as needed for wheezing or shortness of breath.   amiodarone 200 MG tablet Commonly known as: PACERONE Take 1 tablet (200 mg total) by mouth daily.   amLODipine 10 MG tablet Commonly known as: NORVASC TAKE 1 TABLET(10 MG) BY MOUTH DAILY What changed: See the new instructions.   aspirin EC 81 MG tablet Take 81 mg by mouth daily. Swallow whole.   atorvastatin 80 MG tablet Commonly known as: LIPITOR TAKE 1 TABLET(80 MG) BY MOUTH DAILY What changed:  how much to take how to take this when to take this additional instructions   clopidogrel 75 MG tablet Commonly known as: PLAVIX TAKE 1 TABLET BY MOUTH EVERY DAY   fluticasone 50 MCG/ACT nasal spray Commonly known as: FLONASE Place 2 sprays into both nostrils daily as needed for allergies.   lidocaine-prilocaine cream Commonly known as: EMLA Apply 1 application topically once as needed (port access).   Magnesium 100 MG Tabs Take 100 mg by mouth daily.   metoprolol succinate 25 MG 24 hr tablet Commonly known as: TOPROL-XL Take 1 tablet (25 mg total) by mouth daily.   multivitamin with minerals Tabs tablet Take 1 tablet by mouth in the morning. Centrum   nitroGLYCERIN 0.4 MG SL tablet Commonly known as: NITROSTAT PLACE 1 TABLET UNDER THE TONGUE EVERY 5 MINUTES AS NEEDED FOR CHEST PAIN. 3 DOSES MAX What changed:  how much to take how to take this when to take this reasons to take this additional instructions   ondansetron 8 MG tablet Commonly known as: ZOFRAN Take 1 tablet (8 mg total) by mouth every 8 (eight) hours as needed for nausea or vomiting. Starting 3 days after chemotherapy   oxyCODONE-acetaminophen 5-325 MG tablet Commonly known as: PERCOCET/ROXICET Take 1 tablet by mouth every 4 (four) hours as needed for severe pain.   polyethylene glycol 17 g packet Commonly known as: MIRALAX /  GLYCOLAX Take 17 g by mouth daily as needed.   prochlorperazine 10 MG tablet Commonly known as: COMPAZINE Take 1 tablet (10 mg total) by mouth every 6 (six) hours as needed. What changed: reasons to take this   ranolazine 1000 MG SR tablet Commonly known as: RANEXA Take 1 tablet (1,000 mg total) by mouth 2 (two) times daily.   Stiolto Respimat 2.5-2.5 MCG/ACT Aers Generic drug: Tiotropium Bromide-Olodaterol Inhale 2 puffs into the lungs daily.        Follow-up Information     [name], MD. Schedule an appointment as soon as possible for a visit in 1 week(s).   Specialty: Family Medicine Why: with cbc/cmp Contact information: [address] [place] [address] [phone]         [name], MD .   Specialties: Cardiology, Radiology, Interventional Cardiology Contact information: [address] [place] [address] [phone]         Curt Bears, MD Follow up.   Specialty: Oncology Why: next week Contact information: [place] [address] [phone]                Allergies  Allergen Reactions   Prednisone Other (See Comments)    States that this med makes him "crazy"  Tetanus Toxoids Swelling and Other (See Comments)    Fever, Swelling of the arm    Wellbutrin [Bupropion] Other (See Comments)    Crazy thoughts, nightmares   Indomethacin Other (See Comments)    rectal bleeding   Other     Other reaction(s): Unknown   Scallops [Shellfish Allergy] Nausea Only   Varenicline Other (See Comments)    Dreams Other reaction(s): Unknown Other reaction(s): Unknown    Consultations: Oncology/IR   Procedures/Studies: DG Chest 1 View  Result Date: 02/25/2021 CLINICAL DATA:  Post thoracentesis EXAM: CHEST  1 VIEW COMPARISON:  02/24/2021 radiograph and chest CT FINDINGS: Post sternotomy changes. Right-sided central venous catheter tip over the proximal right atrium.  Trace right-sided pleural effusion. No visible pneumothorax. Stable cardiomediastinal silhouette with aortic atherosclerosis. Surgical hardware at the thoracic inlet region. IMPRESSION: 1. Trace right pleural effusion without visible pneumothorax post thoracentesis. 2. No significant changes. Electronically Signed   By: Donavan Foil M.D.   On: 02/25/2021 15:27   DG Abd 1 View  Result Date: 02/10/2021 CLINICAL DATA:  Right flank pain EXAM: ABDOMEN - 1 VIEW COMPARISON:  12/20/2018 FINDINGS: The bowel gas pattern is normal. No radio-opaque calculi or other significant radiographic abnormality are seen. Small right pleural effusion. Partially imaged port catheter at the cavoatrial junction. IMPRESSION: No urinary tract calculi identified. Electronically Signed   By: Macy Mis M.D.   On: 02/10/2021 14:02   CT Angio Chest PE W and/or Wo Contrast  Result Date: 02/24/2021 CLINICAL DATA:  Lung cancer, chemotherapy, shortness of breath EXAM: CT ANGIOGRAPHY CHEST WITH CONTRAST TECHNIQUE: Multidetector CT imaging of the chest was performed using the standard protocol during bolus administration of intravenous contrast. Multiplanar CT image reconstructions and MIPs were obtained to evaluate the vascular anatomy. CONTRAST:  142mL OMNIPAQUE IOHEXOL 350 MG/ML SOLN COMPARISON:  02/14/2021 FINDINGS: Cardiovascular: Satisfactory opacification of the pulmonary arteries to the segmental level. No evidence of pulmonary embolism. Normal heart size. No pericardial effusion. Coronary artery calcification. Post CABG. Thoracic aorta is stable in caliber measuring dilated at 4 cm along the ascending portion. Mediastinum/Nodes: No new enlarged lymph nodes. Lungs/Pleura: Increased, small to moderate right pleural effusion with adjacent atelectasis. Post right upper lobectomy. Upper Abdomen: No acute abnormality. Musculoskeletal: No acute osseous abnormality. Review of the MIP images confirms the above findings. IMPRESSION: No acute  pulmonary embolism. Increased, small to moderate right pleural effusion. Electronically Signed   By: Macy Mis M.D.   On: 02/24/2021 10:12   CT Angio Chest PE W and/or Wo Contrast  Result Date: 02/14/2021 CLINICAL DATA:  Recent surgery. Cancer. Right flank pain. Tachycardia. Pulmonary embolus suspected with high probability. Recent diagnosis of UTI without improvement. History of lung cancer metastatic to brain. EXAM: CT ANGIOGRAPHY CHEST CT ABDOMEN AND PELVIS WITH CONTRAST TECHNIQUE: Multidetector CT imaging of the chest was performed using the standard protocol during bolus administration of intravenous contrast. Multiplanar CT image reconstructions and MIPs were obtained to evaluate the vascular anatomy. Multidetector CT imaging of the abdomen and pelvis was performed using the standard protocol during bolus administration of intravenous contrast. CONTRAST:  116mL OMNIPAQUE IOHEXOL 350 MG/ML SOLN COMPARISON:  CT chest 12/12/2020.  CT abdomen and pelvis 02/11/2021 FINDINGS: CTA CHEST FINDINGS Cardiovascular: There is good opacification of the central and segmental pulmonary arteries. No focal filling defects. No evidence of significant pulmonary embolus. Ascending aortic aneurysm measuring 4 cm in diameter, unchanged. No aortic dissection. Great vessel origins are patent. Coronary artery and aortic calcification. Postoperative changes consistent  with coronary bypass. Normal heart size. No pericardial effusions. Mediastinum/Nodes: Esophagus is decompressed. No significant lymphadenopathy in the chest. Scattered lymph nodes are not pathologically enlarged. Lungs/Pleura: Small right pleural effusion. Mild patchy areas of airspace disease in the right lung could represent early multifocal pneumonia. Left lung is clear. Previous right upper lung nodule has been resected in the interval. Musculoskeletal: Degenerative changes in the spine. Postoperative changes in the lower cervical spine. Sternotomy wires. Old  rib fractures. Review of the MIP images confirms the above findings. CT ABDOMEN and PELVIS FINDINGS Hepatobiliary: Subcentimeter lesion in the dome of the liver likely representing a cavernous hemangioma. Gallbladder and bile ducts are unremarkable. Pancreas: Unremarkable. No pancreatic ductal dilatation or surrounding inflammatory changes. Spleen: Normal in size without focal abnormality. Adrenals/Urinary Tract: No adrenal gland nodules. Cyst in the upper pole left kidney. Nephrograms are symmetrical and otherwise homogeneous. No hydronephrosis or hydroureter. Bladder is unremarkable. Stomach/Bowel: Stomach, small bowel, and colon are not abnormally distended. No wall thickening or inflammatory changes are apparent. Scattered diverticula in the sigmoid colon without evidence of diverticulitis. Appendix is normal. Vascular/Lymphatic: Aortic atherosclerosis. No enlarged abdominal or pelvic lymph nodes. Reproductive: Prostate is unremarkable. Other: No free air or free fluid in the abdomen. Abdominal wall musculature appears intact. Musculoskeletal: Degenerative changes in the spine. No destructive bone lesions. Review of the MIP images confirms the above findings. IMPRESSION: 1. No evidence of significant pulmonary embolus. 2. Small right pleural effusion. Patchy infiltrates in the right lung are nonspecific but could indicate early multifocal pneumonia. 3. No acute process demonstrated in the abdomen or pelvis. 4. Aortic atherosclerosis. Electronically Signed   By: Lucienne Capers M.D.   On: 02/14/2021 22:09   MR Brain W Wo Contrast  Result Date: 02/05/2021 CLINICAL DATA:  Follow-up CNS neoplasm EXAM: MRI HEAD WITHOUT AND WITH CONTRAST TECHNIQUE: Multiplanar, multiecho pulse sequences of the brain and surrounding structures were obtained without and with intravenous contrast. CONTRAST:  35mL MULTIHANCE GADOBENATE DIMEGLUMINE 529 MG/ML IV SOLN COMPARISON:  01/27/2021 FINDINGS: BRAIN New Lesions: 3 mm nodule at  the high left and parasagittal frontal lobe seen on 12:147 Larger lesions: None. Stable or Smaller lesions: 6 mm mm enhancing lesion located in the lateral left cerebellum and seen on 11:40. Other Brain findings: Mild vasogenic edema around the left cerebellar metastasis. No acute infarct, acute hemorrhage, hydrocephalus, or collection. Vascular: Normal flow voids and vascular enhancements Skull and upper cervical spine: Normal marrow signal Sinuses/Orbits: Negative IMPRESSION: 1. 3 mm newly seen metastasis in the high left frontal lobe. 2. 6 mm unchanged left cerebellar metastasis. Electronically Signed   By: Monte Fantasia M.D.   On: 02/05/2021 06:09   MR THORACIC SPINE WO CONTRAST  Result Date: 02/19/2021 CLINICAL DATA:  Initial evaluation for intervertebral disc disorder, ongoing back pain. EXAM: MRI THORACIC SPINE WITHOUT CONTRAST TECHNIQUE: Multiplanar, multisequence MR imaging of the thoracic spine was performed. No intravenous contrast was administered. COMPARISON:  Previous MRI from 09/28/2018. FINDINGS: Alignment: Physiologic with preservation of the normal thoracic kyphosis. No listhesis. Vertebrae: Vertebral body height well maintained without acute or chronic fracture. Bone marrow signal intensity within normal limits. No discrete or worrisome osseous lesions. No abnormal marrow edema. Susceptibility artifact from prior ACDF noted at C7-T1. Cord:  Normal signal and morphology. Paraspinal and other soft tissues: Paraspinous soft tissues within normal limits. Small to moderate layering left pleural effusion. Visualized visceral structures otherwise unremarkable. Disc levels: T1-2:  Unremarkable. T2-3: Unremarkable. T3-4:  Negative interspace.  Mild facet hypertrophy.  No stenosis. T4-5: Negative interspace. Mild right-sided facet hypertrophy. No stenosis. T5-6:  Negative interspace.  Mild facet hypertrophy.  No stenosis. T6-7:  Unremarkable. T7-8:  Unremarkable. T8-9: Right paracentral to  subarticular disc protrusion indents the right ventral thecal sac (series 21, image 26). Mild flattening of the right ventral cord without cord signal changes or significant spinal stenosis. Superimposed right-sided reactive endplate spurring. Foramina remain patent. T9-10: Unremarkable. T10-11:  Unremarkable. T11-12: Minimal disc bulge. Mild facet hypertrophy. No canal or foraminal stenosis. T12-L1:  Unremarkable. IMPRESSION: 1. Right paracentral disc protrusion at T8-9 with secondary mild flattening of the right ventral cord, but no cord signal changes or significant stenosis. 2. Mild multilevel facet hypertrophy throughout the thoracic spine as detailed above. Findings could contribute to back pain. 3. Small to moderate layering left pleural effusion. Electronically Signed   By: Jeannine Boga M.D.   On: 02/19/2021 01:54   MR LUMBAR SPINE WO CONTRAST  Result Date: 02/19/2021 CLINICAL DATA:  Initial evaluation for ongoing lower back pain. EXAM: MRI LUMBAR SPINE WITHOUT CONTRAST TECHNIQUE: Multiplanar, multisequence MR imaging of the lumbar spine was performed. No intravenous contrast was administered. COMPARISON:  Prior radiograph from 09/10/2014. FINDINGS: Segmentation: Standard. Lowest well-formed disc space labeled the L5-S1 level. Alignment: Trace retrolisthesis of L2 on L3. Alignment otherwise normal with preservation of the normal lumbar lordosis. Vertebrae: Vertebral body height well maintained without acute or chronic fracture. Bone marrow signal intensity within normal limits. 1.5 cm benign hemangioma noted within the L1 vertebral body. No other discrete or worrisome osseous lesions. No abnormal marrow edema. Conus medullaris and cauda equina: Conus extends to the L1 level. Conus and cauda equina appear normal. Paraspinal and other soft tissues: Paraspinous soft tissues within normal limits. 2.4 cm T2 hyperintense simple cyst noted within the interpolar left kidney. Visualized visceral  structures otherwise unremarkable. Disc levels: L1-2: Degenerative intervertebral disc space narrowing with disc desiccation and diffuse disc bulge. Associated mild reactive endplate change. Minimal facet spurring. No significant spinal stenosis. Foramina remain patent. L2-3: Trace retrolisthesis. Degenerative intervertebral disc space narrowing with diffuse disc bulge and disc desiccation. Mild facet and ligament flavum hypertrophy. No significant spinal stenosis. Foramina remain patent. L3-4: Degenerative intervertebral disc space narrowing with disc desiccation and diffuse disc bulge. Superimposed broad-based left foraminal to extraforaminal disc protrusion contacts the exiting left L4 nerve root as it courses of the left neural foramen (series 8, image 22). Mild bilateral facet hypertrophy. No significant spinal stenosis. Mild left L3 foraminal narrowing. Right neural foramen remains patent. L4-5: Negative interspace. Mild to moderate bilateral facet hypertrophy. Borderline mild narrowing of the lateral recesses bilaterally. Central canal remains patent. Mild bilateral L4 foraminal stenosis. No frank impingement. L5-S1: Disc desiccation with mild disc bulge, slightly eccentric to the right. Associated right paracentral annular fissure at the level of the descending right S1 nerve root sheath. Mild bilateral facet hypertrophy. No canal or lateral recess stenosis. Foramina remain patent. No impingement. IMPRESSION: 1. Broad-based left foraminal to extraforaminal disc protrusion at L3-4, contacting and potentially irritating the exiting left L4 nerve root. 2. Right eccentric disc bulge with annular fissure at L5-S1, closely approximating and potentially irritating the descending right S1 nerve root. 3. Additional mild for age degenerative disc disease and facet hypertrophy elsewhere within the lumbar spine as above. No other significant stenosis or neural impingement. Electronically Signed   By: Jeannine Boga M.D.   On: 02/19/2021 02:02   CT ABDOMEN PELVIS W CONTRAST  Result Date: 02/14/2021 CLINICAL DATA:  Recent surgery. Cancer. Right flank pain. Tachycardia. Pulmonary embolus suspected with high probability. Recent diagnosis of UTI without improvement. History of lung cancer metastatic to brain. EXAM: CT ANGIOGRAPHY CHEST CT ABDOMEN AND PELVIS WITH CONTRAST TECHNIQUE: Multidetector CT imaging of the chest was performed using the standard protocol during bolus administration of intravenous contrast. Multiplanar CT image reconstructions and MIPs were obtained to evaluate the vascular anatomy. Multidetector CT imaging of the abdomen and pelvis was performed using the standard protocol during bolus administration of intravenous contrast. CONTRAST:  174mL OMNIPAQUE IOHEXOL 350 MG/ML SOLN COMPARISON:  CT chest 12/12/2020.  CT abdomen and pelvis 02/11/2021 FINDINGS: CTA CHEST FINDINGS Cardiovascular: There is good opacification of the central and segmental pulmonary arteries. No focal filling defects. No evidence of significant pulmonary embolus. Ascending aortic aneurysm measuring 4 cm in diameter, unchanged. No aortic dissection. Great vessel origins are patent. Coronary artery and aortic calcification. Postoperative changes consistent with coronary bypass. Normal heart size. No pericardial effusions. Mediastinum/Nodes: Esophagus is decompressed. No significant lymphadenopathy in the chest. Scattered lymph nodes are not pathologically enlarged. Lungs/Pleura: Small right pleural effusion. Mild patchy areas of airspace disease in the right lung could represent early multifocal pneumonia. Left lung is clear. Previous right upper lung nodule has been resected in the interval. Musculoskeletal: Degenerative changes in the spine. Postoperative changes in the lower cervical spine. Sternotomy wires. Old rib fractures. Review of the MIP images confirms the above findings. CT ABDOMEN and PELVIS FINDINGS Hepatobiliary:  Subcentimeter lesion in the dome of the liver likely representing a cavernous hemangioma. Gallbladder and bile ducts are unremarkable. Pancreas: Unremarkable. No pancreatic ductal dilatation or surrounding inflammatory changes. Spleen: Normal in size without focal abnormality. Adrenals/Urinary Tract: No adrenal gland nodules. Cyst in the upper pole left kidney. Nephrograms are symmetrical and otherwise homogeneous. No hydronephrosis or hydroureter. Bladder is unremarkable. Stomach/Bowel: Stomach, small bowel, and colon are not abnormally distended. No wall thickening or inflammatory changes are apparent. Scattered diverticula in the sigmoid colon without evidence of diverticulitis. Appendix is normal. Vascular/Lymphatic: Aortic atherosclerosis. No enlarged abdominal or pelvic lymph nodes. Reproductive: Prostate is unremarkable. Other: No free air or free fluid in the abdomen. Abdominal wall musculature appears intact. Musculoskeletal: Degenerative changes in the spine. No destructive bone lesions. Review of the MIP images confirms the above findings. IMPRESSION: 1. No evidence of significant pulmonary embolus. 2. Small right pleural effusion. Patchy infiltrates in the right lung are nonspecific but could indicate early multifocal pneumonia. 3. No acute process demonstrated in the abdomen or pelvis. 4. Aortic atherosclerosis. Electronically Signed   By: Lucienne Capers M.D.   On: 02/14/2021 22:09   CT ABDOMEN PELVIS W CONTRAST  Result Date: 02/11/2021 CLINICAL DATA:  Right flank pain and right upper quadrant tenderness. Kidney stones suspected. Recent surgery for cancer removal. First chemotherapy yesterday. EXAM: CT ABDOMEN AND PELVIS WITH CONTRAST TECHNIQUE: Multidetector CT imaging of the abdomen and pelvis was performed using the standard protocol following bolus administration of intravenous contrast. CONTRAST:  116mL OMNIPAQUE IOHEXOL 300 MG/ML  SOLN COMPARISON:  11/18/2020 FINDINGS: Lower chest: Small  right pleural effusion. Hepatobiliary: No focal liver abnormality is seen. No gallstones, gallbladder wall thickening, or biliary dilatation. Pancreas: Unremarkable. No pancreatic ductal dilatation or surrounding inflammatory changes. Spleen: Normal in size without focal abnormality. Adrenals/Urinary Tract: No adrenal gland nodules. Small cyst on the left kidney. Nephrograms are symmetrical. No hydronephrosis or hydroureter. No renal or ureteral stones identified. Bladder is unremarkable. Stomach/Bowel: Stomach, small bowel, and colon are not abnormally distended. No  wall thickening or inflammatory changes are appreciated. Appendix is normal. Vascular/Lymphatic: Aortic atherosclerosis. No enlarged abdominal or pelvic lymph nodes. Reproductive: Prostate is unremarkable. Other: No free air or free fluid in the abdomen. Small focal subcutaneous emphysema in the anterior abdominal wall, likely injection site. No infiltration or collection in the subcutaneous fat to suggest postoperative complication. Musculoskeletal: No acute or significant osseous findings. IMPRESSION: 1. No renal or ureteral stone or obstruction. 2. No acute process demonstrated in the abdomen or pelvis. 3. Aortic atherosclerosis. 4. Small right pleural effusion. Electronically Signed   By: Lucienne Capers M.D.   On: 02/11/2021 01:13   DG Chest Port 1 View  Result Date: 02/24/2021 CLINICAL DATA:  Chest pain and shortness of breath. History of lung cancer. Question sepsis. EXAM: PORTABLE CHEST 1 VIEW COMPARISON:  01/07/2021 FINDINGS: Power port placed from a right internal jugular approach has its tip at the SVC RA junction. Previous median sternotomy and CABG. Bronchial valves present on the right. Previous lobectomy on the right. No sign of active infiltrate or collapse. There may be a small amount of sub pulmonic effusion on the right. IMPRESSION: Bronchial valves on the right. Small amount of sub pulmonic effusion on the right. No pneumonia  visible by radiography. Electronically Signed   By: Nelson Chimes M.D.   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 He presents for port catheter placement to establish durable venous access. EXAM: IMPLANTED PORT A CATH PLACEMENT WITH ULTRASOUND AND FLUOROSCOPIC GUIDANCE MEDICATIONS: None. ANESTHESIA/SEDATION: Versed 2 mg IV; Fentanyl 100 mcg IV; Moderate Sedation Time:  23 minutes The patient was continuously monitored during the procedure by the interventional radiology nurse under my direct supervision. FLUOROSCOPY TIME:  0 minutes, 18 seconds (6 mGy) COMPLICATIONS: None immediate. PROCEDURE: The right neck and chest was prepped with chlorhexidine, and draped in the usual sterile fashion using maximum barrier technique (cap and mask, sterile gown, sterile gloves, large sterile sheet, hand hygiene and cutaneous antiseptic). Local anesthesia was attained by infiltration with 1% lidocaine with epinephrine. Ultrasound demonstrated patency of the right internal jugular vein, and this was documented with an image. Under real-time ultrasound guidance, this vein was accessed with a 21 gauge micropuncture needle and image documentation was performed. A small dermatotomy was made at the access site with an 11 scalpel. A 0.018" wire was advanced into the SVC and the access needle exchanged for a 20F micropuncture vascular sheath. The 0.018" wire was then removed and a 0.035" wire advanced into the IVC. An appropriate location for the subcutaneous reservoir was selected below the clavicle and an incision was made through the skin and underlying soft tissues. The subcutaneous tissues were then dissected using a combination of blunt and sharp surgical technique and a pocket was formed. A single lumen power injectable portacatheter was then tunneled through the subcutaneous tissues from the pocket to the  dermatotomy and the port reservoir placed within the subcutaneous pocket. The venous access site was then serially dilated and a peel away vascular sheath placed over the wire. The wire was removed and the port catheter advanced into position under fluoroscopic guidance. The catheter tip is positioned in the superior cavoatrial junction. This was documented with a spot image. The portacatheter was then tested and found to flush and aspirate well. The port was flushed with saline followed by 100 units/mL heparinized saline. The pocket was then closed in  two layers using first subdermal inverted interrupted absorbable sutures followed by a running subcuticular suture. The epidermis was then sealed with Dermabond. The dermatotomy at the venous access site was also closed with Dermabond. IMPRESSION: Successful placement of a right IJ approach Power Port with ultrasound and fluoroscopic guidance. The catheter is ready for use. Electronically Signed   By: Jacqulynn Cadet M.D.   On: 02/06/2021 14:33   LONG TERM MONITOR-LIVE TELEMETRY (3-14 DAYS)  Result Date: 02/09/2021  Normal sinus rhythm with rare PACs and PVCs.  Brief, string of PACs not associated with symptoms.  No sustained pathologic arrhythmias.  Patch Wear Time:  14 days and 0 hours (2022-05-14T12:06:36-0400 to 2022-05-28T12:06:36-0400) Patient had a min HR of 45 bpm, max HR of 203 bpm, and avg HR of 82 bpm. Predominant underlying rhythm was Sinus Rhythm. 1 run of Supraventricular Tachycardia occurred lasting 6 beats with a max rate of 203 bpm (avg 199 bpm). Isolated SVEs were rare (<1.0%), SVE Couplets were rare (<1.0%), and SVE Triplets were rare (<1.0%). Isolated VEs were rare (<1.0%), and no VE Couplets or VE Triplets were present.   US Abdomen Limited RUQ (LIVER/GB)  Result Date: 02/11/2021 CLINICAL DATA:  Right upper quadrant pain EXAM: ULTRASOUND ABDOMEN LIMITED RIGHT UPPER QUADRANT COMPARISON:  10/24/2020 FINDINGS: Gallbladder: No gallstones  or wall thickening visualized. No sonographic Murphy sign noted by sonographer. Common bile duct: Diameter: Normal caliber, 4 mm Liver: No focal lesion identified. Within normal limits in parenchymal echogenicity. Portal vein is patent on color Doppler imaging with normal direction of blood flow towards the liver. Other: None. IMPRESSION: No acute findings. Electronically Signed   By: Rolm Baptise M.D.   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 Request received for diagnostic and therapeutic right thoracentesis. EXAM: ULTRASOUND GUIDED DIAGNOSTIC AND THERAPEUTIC RIGHT THORACENTESIS MEDICATIONS: 1% lidocaine to skin and subcutaneous tissue COMPLICATIONS: None immediate. PROCEDURE: An ultrasound guided thoracentesis was thoroughly discussed with the patient and questions answered. The benefits, risks, alternatives and complications were also discussed. The patient understands and wishes to proceed with the procedure. Written consent was obtained. Ultrasound was performed to localize and mark an adequate pocket of fluid in the right chest. The area was then prepped and draped in the normal sterile fashion. 1% Lidocaine was used for local anesthesia. Under ultrasound guidance a 6 Fr Safe-T-Centesis catheter was introduced. Thoracentesis was performed. The catheter was removed and a dressing applied. FINDINGS: A total of approximately 220 cc of hazy, amber fluid was removed. Samples were sent to the laboratory as requested by the clinical team. IMPRESSION: Successful ultrasound guided diagnostic and therapeutic right thoracentesis yielding 220 cc of pleural fluid. Read by: Rowe Robert, PA-C Electronically Signed   By: Aletta Edouard M.D.   On: 02/25/2021 14:51      Subjective: Patient seen and examined at bedside.  Denies overnight fever, vomiting, worsening shortness of breath.  Discharge Exam: Vitals:   02/27/21 0851 02/27/21 1010  BP:  124/74  Pulse:  (!) 101  Resp:  20  Temp:  98.4 F (36.9 C)  SpO2: 92% 99%    General: Pt is alert, awake, not in acute distress.  Currently on room air. Cardiovascular: Mild intermittent tachycardia present, S1/S2 +  Respiratory: bilateral decreased breath sounds at bases Abdominal: Soft, NT, ND, bowel sounds + Extremities: no edema, no cyanosis    The results of significant diagnostics from this hospitalization (including imaging, microbiology, ancillary and laboratory) are listed below for reference.     Microbiology: Recent Results (from the past 240 hour(s))  Urine culture     Status: None   Collection Time: 02/24/21  8:21 AM   Specimen: In/Out Cath Urine  Result Value Ref Range Status   Specimen Description   Final    IN/OUT CATH URINE Performed at [facility], [address] [place], [place] [address]    Special Requests   Final    NONE Performed at [facility], [address] [place], [place] [address]    Culture   Final    NO GROWTH Performed at [facility], [place] [address], [place], [place] [address]    Report Status 02/26/2021 FINAL  Final  Blood culture (routine single)     Status: None (Preliminary result)   Collection Time: 02/24/21  8:35 AM   Specimen: BLOOD RIGHT ARM  Result Value Ref Range Status   Specimen Description   Final    BLOOD RIGHT ARM Performed at [facility], [address] [place], [place] [address]    Special Requests   Final    BOTTLES DRAWN AEROBIC AND ANAEROBIC Blood Culture adequate volume Performed at [facility], [address] [place], [place] [address]    Culture   Final    NO GROWTH 3 DAYS Performed at [facility], [place] [address],  [place], [place] [address]    Report Status PENDING  Incomplete  Resp Panel by RT-PCR (Flu A&B, Covid) Nasopharyngeal Swab     Status: None   Collection Time: 02/24/21  8:53 AM   Specimen: Nasopharyngeal Swab; Nasopharyngeal(NP) swabs in vial transport medium  Result Value Ref Range Status   SARS Coronavirus 2 by RT PCR NEGATIVE NEGATIVE Final    Comment: (NOTE) SARS-CoV-2 target nucleic acids are NOT DETECTED.  The SARS-CoV-2 RNA is generally detectable in upper respiratory specimens during the acute phase of infection. The lowest concentration of SARS-CoV-2 viral copies this assay can detect is 138 copies/mL. A negative result does not preclude SARS-Cov-2 infection and should not be used as the sole basis for treatment or other patient management decisions. A negative result may occur with  improper specimen collection/handling, submission of specimen other than nasopharyngeal swab, presence of viral mutation(s) within the areas targeted by this assay, and inadequate number of viral copies(<138 copies/mL). A negative result must be combined with clinical observations, patient history, and epidemiological information. The expected result is Negative.  Fact Sheet for Patients:  EntrepreneurPulse.com.au  Fact Sheet for Healthcare Providers:  IncredibleEmployment.be  This test is no t yet approved or cleared by the [place] FDA and  has been authorized for detection and/or diagnosis of SARS-CoV-2 by FDA under an Emergency Use Authorization (EUA). This EUA will remain  in effect (meaning this test can be used) for the duration of the COVID-19 declaration under Section 564(b)(1) of the Act, 21 U.S.C.section 360bbb-3(b)(1), unless the authorization is terminated  or revoked sooner.       Influenza A by PCR NEGATIVE NEGATIVE Final   Influenza B by PCR NEGATIVE NEGATIVE Final    Comment: (NOTE) The Xpert Xpress SARS-CoV-2/FLU/RSV plus assay is  intended as an aid in the  diagnosis of influenza from Nasopharyngeal swab specimens and should not be used as a sole basis for treatment. Nasal washings and aspirates are unacceptable for Xpert Xpress SARS-CoV-2/FLU/RSV testing.  Fact Sheet for Patients: EntrepreneurPulse.com.au  Fact Sheet for Healthcare Providers: IncredibleEmployment.be  This test is not yet approved or cleared by the [place] FDA and has been authorized for detection and/or diagnosis of SARS-CoV-2 by FDA under an Emergency Use Authorization (EUA). This EUA will remain in effect (meaning this test can be used) for the duration of the COVID-19 declaration under Section 564(b)(1) of the Act, 21 U.S.C. section 360bbb-3(b)(1), unless the authorization is terminated or revoked.  Performed at [facility], [address] [place], [place] [address]   Culture, blood (single)     Status: None (Preliminary result)   Collection Time: 02/24/21  1:17 PM   Specimen: Left Antecubital; Blood  Result Value Ref Range Status   Specimen Description   Final    LEFT ANTECUBITAL Performed at [facility], [address] [place], [place] [address]    Special Requests   Final    BOTTLES DRAWN AEROBIC AND ANAEROBIC Blood Culture adequate volume Performed at [facility], [address] [place], [place] [address]    Culture   Final    NO GROWTH 3 DAYS Performed at [facility], [place] [address], [place], [place] [address]    Report Status PENDING  Incomplete  Gram stain     Status: None   Collection Time: 02/25/21  3:07 PM   Specimen: Lung, Right; Pleural Fluid  Result Value Ref Range Status   Specimen Description   Final    PLEURAL RIGHT Performed at [place] [address], [place], [place] [address]    Special Requests   Final    NONE Performed at [facility], [place] [address], [place], [place]  [address]    Gram Stain   Final    RARE WBC PRESENT, PREDOMINANTLY MONONUCLEAR NO ORGANISMS SEEN Performed at [facility], [place] [address] [place],  [address]    Report Status 02/26/2021 FINAL  Final     Labs: BNP (last 3 results) Recent Labs    02/24/21 0821  BNP 56.3   Basic Metabolic Panel: Recent Labs  Lab 02/24/21 0821 02/24/21 1759 02/25/21 0514 02/26/21 0519 02/27/21 0448  NA 137  --  137 136 137  K 4.6  --  4.0 4.0 4.3  CL 103  --  101 102 105  CO2 26  --  28 27 25   GLUCOSE 110*  --  106* 111* 113*  BUN 20  --  20 20 16   CREATININE 1.56* 1.54* 1.93* 1.60* 1.50*  CALCIUM 9.3  --  9.2 8.7* 8.8*   Liver Function Tests: Recent Labs  Lab 02/24/21 0821 02/25/21 0514  AST 19 17  ALT 21 20  ALKPHOS 69 67  BILITOT 0.4 0.4  PROT 6.8 6.4*  ALBUMIN 3.8 3.6   No results for input(s): LIPASE, AMYLASE in the last 168 hours. No results for input(s): AMMONIA in the last 168 hours. CBC: Recent Labs  Lab 02/24/21 0821 02/24/21 1759 02/25/21 0514 02/26/21 1041 02/27/21 0448  WBC 1.5* 2.5* 2.0* 2.2* 5.8  NEUTROABS 0.1*  --   --  0.2* 3.1  HGB 11.1* 11.4* 10.4* 11.8* 10.5*  HCT 33.7* 34.8* 33.6* 37.0* 33.2*  MCV 90.3 90.4 92.3 93.2 93.8  PLT 207  228 193 277 240   Cardiac Enzymes: No results for input(s): CKTOTAL, CKMB, CKMBINDEX, TROPONINI in the last 168 hours. BNP: Invalid input(s): POCBNP CBG: No results for input(s): GLUCAP in the last 168 hours. D-Dimer No results for input(s): DDIMER in the last 72 hours. Hgb A1c No results for input(s): HGBA1C in the last 72 hours. Lipid Profile No results for input(s): CHOL, HDL, LDLCALC, TRIG, CHOLHDL, LDLDIRECT in the last 72 hours. Thyroid function studies No results for input(s): TSH, T4TOTAL, T3FREE, THYROIDAB in the last 72 hours.  Invalid input(s): FREET3 Anemia work up No results for input(s): VITAMINB12, FOLATE, FERRITIN, TIBC, IRON, RETICCTPCT in the last 72 hours. Urinalysis     Component Value Date/Time   COLORURINE YELLOW 02/24/2021 0821   APPEARANCEUR CLEAR 02/24/2021 0821   LABSPEC 1.015 02/24/2021 0821   PHURINE 7.5 02/24/2021 0821   GLUCOSEU NEGATIVE 02/24/2021 0821   HGBUR NEGATIVE 02/24/2021 0821   BILIRUBINUR NEGATIVE 02/24/2021 0821   KETONESUR NEGATIVE 02/24/2021 0821   PROTEINUR NEGATIVE 02/24/2021 0821   UROBILINOGEN 0.2 05/13/2014 0658   NITRITE NEGATIVE 02/24/2021 0821   LEUKOCYTESUR NEGATIVE 02/24/2021 2353   Sepsis Labs Invalid input(s): PROCALCITONIN,  WBC,  LACTICIDVEN Microbiology Recent Results (from the past 240 hour(s))  Urine culture     Status: None   Collection Time: 02/24/21  8:21 AM   Specimen: In/Out Cath Urine  Result Value Ref Range Status   Specimen Description   Final    IN/OUT CATH URINE Performed at [facility], [address] [place], [place] [address]    Special Requests   Final    NONE Performed at [facility], [address] [place], [place] [address]    Culture   Final    NO GROWTH Performed at [facility], [place] [address] [place], [place] [address]    Report Status 02/26/2021 FINAL  Final  Blood culture (routine single)     Status: None (Preliminary result)   Collection Time: 02/24/21  8:35 AM   Specimen: BLOOD RIGHT ARM  Result Value Ref Range Status   Specimen Description   Final    BLOOD RIGHT ARM Performed at [facility], [address] [place], [place] [address]    Special Requests   Final    BOTTLES DRAWN AEROBIC AND ANAEROBIC Blood Culture adequate volume Performed at [facility], [address] [place], [place] [address]    Culture   Final    NO GROWTH 3 DAYS Performed at [facility], [place] [address], [place], [place] [address]    Report Status PENDING  Incomplete  Resp Panel by RT-PCR (Flu A&B, Covid) Nasopharyngeal Swab     Status: None   Collection Time: 02/24/21  8:53 AM   Specimen: Nasopharyngeal Swab;  Nasopharyngeal(NP) swabs in vial transport medium  Result Value Ref Range Status   SARS Coronavirus 2 by RT PCR NEGATIVE NEGATIVE Final    Comment: (NOTE) SARS-CoV-2 target nucleic acids are NOT DETECTED.  The SARS-CoV-2 RNA is generally detectable in upper respiratory specimens during the acute phase of infection. The lowest concentration of SARS-CoV-2 viral copies this assay can detect is 138 copies/mL. A negative result does not preclude SARS-Cov-2 infection and should not be used as the sole basis for treatment or other patient management decisions. A negative result may occur with  improper specimen collection/handling, submission of specimen other than nasopharyngeal swab, presence of viral mutation(s) within  the areas targeted by this assay, and inadequate number of viral copies(<138 copies/mL). A negative result must be combined with clinical observations, patient history, and epidemiological information. The expected result is Negative.  Fact Sheet for Patients:  EntrepreneurPulse.com.au  Fact Sheet for Healthcare Providers:  IncredibleEmployment.be  This test is no t yet approved or cleared by the [place] FDA and  has been authorized for detection and/or diagnosis of SARS-CoV-2 by FDA under an Emergency Use Authorization (EUA). This EUA will remain  in effect (meaning this test can be used) for the duration of the COVID-19 declaration under Section 564(b)(1) of the Act, 21 U.S.C.section 360bbb-3(b)(1), unless the authorization is terminated  or revoked sooner.       Influenza A by PCR NEGATIVE NEGATIVE Final   Influenza B by PCR NEGATIVE NEGATIVE Final    Comment: (NOTE) The Xpert Xpress SARS-CoV-2/FLU/RSV plus assay is intended as an aid in the diagnosis of influenza from Nasopharyngeal swab specimens and should not be used as a sole basis for treatment. Nasal washings and aspirates are unacceptable for Xpert Xpress  SARS-CoV-2/FLU/RSV testing.  Fact Sheet for Patients: EntrepreneurPulse.com.au  Fact Sheet for Healthcare Providers: IncredibleEmployment.be  This test is not yet approved or cleared by the [place] FDA and has been authorized for detection and/or diagnosis of SARS-CoV-2 by FDA under an Emergency Use Authorization (EUA). This EUA will remain in effect (meaning this test can be used) for the duration of the COVID-19 declaration under Section 564(b)(1) of the Act, 21 U.S.C. section 360bbb-3(b)(1), unless the authorization is terminated or revoked.  Performed at [facility], [address] [place], [place] [address]   Culture, blood (single)     Status: None (Preliminary result)   Collection Time: 02/24/21  1:17 PM   Specimen: Left Antecubital; Blood  Result Value Ref Range Status   Specimen Description   Final    LEFT ANTECUBITAL Performed at [facility], [address] [place], [place] [address]    Special Requests   Final    BOTTLES DRAWN AEROBIC AND ANAEROBIC Blood Culture adequate volume Performed at [facility], [address] [place], [place] [address]    Culture   Final    NO GROWTH 3 DAYS Performed at [facility], [place] [address], [place], [place] [address]    Report Status PENDING  Incomplete  Gram stain     Status: None   Collection Time: 02/25/21  3:07 PM   Specimen: Lung, Right; Pleural Fluid  Result Value Ref Range Status   Specimen Description   Final    PLEURAL RIGHT Performed at [place] [address] [place], [place] [address]    Special Requests   Final    NONE Performed at [facility], [place] [address], [place], [place] [address]    Gram Stain   Final    RARE WBC PRESENT, PREDOMINANTLY MONONUCLEAR NO ORGANISMS SEEN Performed at [facility], [place] [address] [place], [place] [address]    Report Status 02/26/2021  FINAL  Final     Time coordinating discharge: 35 minutes  SIGNED:   Aline August, MD  [facility] 02/27/2021, 10:41 AM

## 2021-02-28 ENCOUNTER — Encounter (HOSPITAL_COMMUNITY): Payer: Self-pay

## 2021-02-28 ENCOUNTER — Inpatient Hospital Stay (HOSPITAL_COMMUNITY)
Admission: EM | Admit: 2021-02-28 | Discharge: 2021-03-02 | DRG: 206 | Disposition: A | Discharge status: Home / Self Care | Payer: Medicare Other | Attending: Internal Medicine | Admitting: Internal Medicine

## 2021-02-28 ENCOUNTER — Other Ambulatory Visit: Payer: Self-pay

## 2021-02-28 ENCOUNTER — Emergency Department (HOSPITAL_COMMUNITY): Payer: Medicare Other

## 2021-02-28 DIAGNOSIS — I255 Ischemic cardiomyopathy: Secondary | ICD-10-CM | POA: Diagnosis present

## 2021-02-28 DIAGNOSIS — N1831 Chronic kidney disease, stage 3a: Secondary | ICD-10-CM | POA: Diagnosis present

## 2021-02-28 DIAGNOSIS — C3491 Malignant neoplasm of unspecified part of right bronchus or lung: Secondary | ICD-10-CM | POA: Diagnosis present

## 2021-02-28 DIAGNOSIS — R0902 Hypoxemia: Secondary | ICD-10-CM | POA: Diagnosis present

## 2021-02-28 DIAGNOSIS — I10 Essential (primary) hypertension: Secondary | ICD-10-CM | POA: Diagnosis not present

## 2021-02-28 DIAGNOSIS — Z87891 Personal history of nicotine dependence: Secondary | ICD-10-CM

## 2021-02-28 DIAGNOSIS — M5126 Other intervertebral disc displacement, lumbar region: Secondary | ICD-10-CM | POA: Diagnosis present

## 2021-02-28 DIAGNOSIS — I130 Hypertensive heart and chronic kidney disease with heart failure and stage 1 through stage 4 chronic kidney disease, or unspecified chronic kidney disease: Secondary | ICD-10-CM | POA: Diagnosis present

## 2021-02-28 DIAGNOSIS — Z7982 Long term (current) use of aspirin: Secondary | ICD-10-CM

## 2021-02-28 DIAGNOSIS — Z85828 Personal history of other malignant neoplasm of skin: Secondary | ICD-10-CM | POA: Diagnosis not present

## 2021-02-28 DIAGNOSIS — Z20822 Contact with and (suspected) exposure to covid-19: Secondary | ICD-10-CM | POA: Diagnosis present

## 2021-02-28 DIAGNOSIS — Z902 Acquired absence of lung [part of]: Secondary | ICD-10-CM

## 2021-02-28 DIAGNOSIS — I5032 Chronic diastolic (congestive) heart failure: Secondary | ICD-10-CM | POA: Diagnosis present

## 2021-02-28 DIAGNOSIS — C7951 Secondary malignant neoplasm of bone: Secondary | ICD-10-CM | POA: Diagnosis present

## 2021-02-28 DIAGNOSIS — Z923 Personal history of irradiation: Secondary | ICD-10-CM | POA: Diagnosis not present

## 2021-02-28 DIAGNOSIS — Z7902 Long term (current) use of antithrombotics/antiplatelets: Secondary | ICD-10-CM

## 2021-02-28 DIAGNOSIS — F32A Depression, unspecified: Secondary | ICD-10-CM | POA: Diagnosis present

## 2021-02-28 DIAGNOSIS — I25810 Atherosclerosis of coronary artery bypass graft(s) without angina pectoris: Secondary | ICD-10-CM | POA: Diagnosis not present

## 2021-02-28 DIAGNOSIS — K219 Gastro-esophageal reflux disease without esophagitis: Secondary | ICD-10-CM | POA: Diagnosis present

## 2021-02-28 DIAGNOSIS — Z951 Presence of aortocoronary bypass graft: Secondary | ICD-10-CM | POA: Diagnosis not present

## 2021-02-28 DIAGNOSIS — I252 Old myocardial infarction: Secondary | ICD-10-CM | POA: Diagnosis not present

## 2021-02-28 DIAGNOSIS — C7931 Secondary malignant neoplasm of brain: Secondary | ICD-10-CM | POA: Diagnosis present

## 2021-02-28 DIAGNOSIS — Z888 Allergy status to other drugs, medicaments and biological substances status: Secondary | ICD-10-CM | POA: Diagnosis not present

## 2021-02-28 DIAGNOSIS — R0600 Dyspnea, unspecified: Secondary | ICD-10-CM

## 2021-02-28 DIAGNOSIS — Z981 Arthrodesis status: Secondary | ICD-10-CM

## 2021-02-28 DIAGNOSIS — I2510 Atherosclerotic heart disease of native coronary artery without angina pectoris: Secondary | ICD-10-CM | POA: Diagnosis present

## 2021-02-28 DIAGNOSIS — Z9221 Personal history of antineoplastic chemotherapy: Secondary | ICD-10-CM

## 2021-02-28 DIAGNOSIS — I480 Paroxysmal atrial fibrillation: Secondary | ICD-10-CM | POA: Diagnosis present

## 2021-02-28 DIAGNOSIS — Z8249 Family history of ischemic heart disease and other diseases of the circulatory system: Secondary | ICD-10-CM

## 2021-02-28 DIAGNOSIS — E7800 Pure hypercholesterolemia, unspecified: Secondary | ICD-10-CM | POA: Diagnosis present

## 2021-02-28 DIAGNOSIS — M109 Gout, unspecified: Secondary | ICD-10-CM | POA: Diagnosis present

## 2021-02-28 DIAGNOSIS — Z8673 Personal history of transient ischemic attack (TIA), and cerebral infarction without residual deficits: Secondary | ICD-10-CM | POA: Diagnosis not present

## 2021-02-28 DIAGNOSIS — Z91013 Allergy to seafood: Secondary | ICD-10-CM

## 2021-02-28 DIAGNOSIS — Z955 Presence of coronary angioplasty implant and graft: Secondary | ICD-10-CM

## 2021-02-28 DIAGNOSIS — Z823 Family history of stroke: Secondary | ICD-10-CM

## 2021-02-28 DIAGNOSIS — Z79899 Other long term (current) drug therapy: Secondary | ICD-10-CM

## 2021-02-28 DIAGNOSIS — Z801 Family history of malignant neoplasm of trachea, bronchus and lung: Secondary | ICD-10-CM

## 2021-02-28 DIAGNOSIS — C3490 Malignant neoplasm of unspecified part of unspecified bronchus or lung: Secondary | ICD-10-CM | POA: Diagnosis present

## 2021-02-28 LAB — CBC WITH DIFFERENTIAL/PLATELET
Abs Immature Granulocytes: 1.34 10*3/uL — ABNORMAL HIGH (ref 0.00–0.07)
Basophils Absolute: 0.1 10*3/uL (ref 0.0–0.1)
Basophils Relative: 0 %
Eosinophils Absolute: 0.1 10*3/uL (ref 0.0–0.5)
Eosinophils Relative: 1 %
HCT: 35 % — ABNORMAL LOW (ref 39.0–52.0)
Hemoglobin: 11.3 g/dL — ABNORMAL LOW (ref 13.0–17.0)
Immature Granulocytes: 9 %
Lymphocytes Relative: 13 %
Lymphs Abs: 2.1 10*3/uL (ref 0.7–4.0)
MCH: 29.7 pg (ref 26.0–34.0)
MCHC: 32.3 g/dL (ref 30.0–36.0)
MCV: 91.9 fL (ref 80.0–100.0)
Monocytes Absolute: 1.3 10*3/uL — ABNORMAL HIGH (ref 0.1–1.0)
Monocytes Relative: 8 %
Neutro Abs: 10.8 10*3/uL — ABNORMAL HIGH (ref 1.7–7.7)
Neutrophils Relative %: 69 %
Platelets: 319 10*3/uL (ref 150–400)
RBC: 3.81 MIL/uL — ABNORMAL LOW (ref 4.22–5.81)
RDW: 13.2 % (ref 11.5–15.5)
WBC: 15.6 10*3/uL — ABNORMAL HIGH (ref 4.0–10.5)
nRBC: 0 % (ref 0.0–0.2)

## 2021-02-28 LAB — COMPREHENSIVE METABOLIC PANEL
ALT: 19 U/L (ref 0–44)
AST: 21 U/L (ref 15–41)
Albumin: 3.9 g/dL (ref 3.5–5.0)
Alkaline Phosphatase: 79 U/L (ref 38–126)
Anion gap: 10 (ref 5–15)
BUN: 17 mg/dL (ref 8–23)
CO2: 24 mmol/L (ref 22–32)
Calcium: 9.4 mg/dL (ref 8.9–10.3)
Chloride: 103 mmol/L (ref 98–111)
Creatinine, Ser: 1.6 mg/dL — ABNORMAL HIGH (ref 0.61–1.24)
GFR, Estimated: 48 mL/min — ABNORMAL LOW (ref >60)
Glucose, Bld: 106 mg/dL — ABNORMAL HIGH (ref 70–99)
Potassium: 3.9 mmol/L (ref 3.5–5.1)
Sodium: 137 mmol/L (ref 135–145)
Total Bilirubin: 0.5 mg/dL (ref 0.3–1.2)
Total Protein: 7.2 g/dL (ref 6.5–8.1)

## 2021-02-28 LAB — PROTIME-INR
INR: 1 (ref 0.8–1.2)
Prothrombin Time: 13 seconds (ref 11.4–15.2)

## 2021-02-28 LAB — TROPONIN I (HIGH SENSITIVITY)
Troponin I (High Sensitivity): 4 ng/L (ref <18)
Troponin I (High Sensitivity): 5 ng/L (ref <18)

## 2021-02-28 MED ORDER — AMLODIPINE BESYLATE 10 MG PO TABS
10.0000 mg | ORAL_TABLET | Freq: Every day | ORAL | Status: DC
  Administered 2021-03-01 – 2021-03-02 (×2): 10 mg via ORAL
  Filled 2021-02-28 (×2): qty 1

## 2021-02-28 MED ORDER — SODIUM CHLORIDE (PF) 0.9 % IJ SOLN
INTRAMUSCULAR | Status: AC
  Filled 2021-02-28: qty 50

## 2021-02-28 MED ORDER — ONDANSETRON HCL 4 MG PO TABS: 4.0000 mg | ORAL_TABLET | Freq: Four times a day (QID) | ORAL | Status: DC | PRN

## 2021-02-28 MED ORDER — ONDANSETRON HCL 4 MG/2ML IJ SOLN
4.0000 mg | Freq: Four times a day (QID) | INTRAMUSCULAR | Status: DC | PRN
  Administered 2021-03-01 (×4): 4 mg via INTRAVENOUS
  Filled 2021-02-28 (×4): qty 2

## 2021-02-28 MED ORDER — AMIODARONE HCL 200 MG PO TABS
200.0000 mg | ORAL_TABLET | Freq: Every evening | ORAL | Status: DC
  Administered 2021-03-01: 200 mg via ORAL
  Filled 2021-02-28: qty 1

## 2021-02-28 MED ORDER — OXYCODONE HCL 5 MG PO TABS
5.0000 mg | ORAL_TABLET | Freq: Once | ORAL | Status: AC
  Administered 2021-02-28: 5 mg via ORAL
  Filled 2021-02-28: qty 1

## 2021-02-28 MED ORDER — ACETAMINOPHEN 650 MG RE SUPP: 650.0000 mg | Freq: Four times a day (QID) | RECTAL | Status: DC | PRN

## 2021-02-28 MED ORDER — IOHEXOL 350 MG/ML SOLN
80.0000 mL | Freq: Once | INTRAVENOUS | Status: AC | PRN
  Administered 2021-02-28: 80 mL via INTRAVENOUS

## 2021-02-28 MED ORDER — UMECLIDINIUM BROMIDE 62.5 MCG/INH IN AEPB
1.0000 | INHALATION_SPRAY | Freq: Every day | RESPIRATORY_TRACT | Status: DC
  Administered 2021-03-01: 1 via RESPIRATORY_TRACT
  Filled 2021-02-28: qty 7

## 2021-02-28 MED ORDER — ASPIRIN EC 81 MG PO TBEC
81.0000 mg | DELAYED_RELEASE_TABLET | Freq: Every day | ORAL | Status: DC
  Administered 2021-03-01 – 2021-03-02 (×2): 81 mg via ORAL
  Filled 2021-02-28 (×2): qty 1

## 2021-02-28 MED ORDER — MORPHINE SULFATE (PF) 4 MG/ML IV SOLN
4.0000 mg | Freq: Once | INTRAVENOUS | Status: AC
  Administered 2021-02-28: 4 mg via INTRAVENOUS
  Filled 2021-02-28: qty 1

## 2021-02-28 MED ORDER — ARFORMOTEROL TARTRATE 15 MCG/2ML IN NEBU
15.0000 ug | INHALATION_SOLUTION | Freq: Two times a day (BID) | RESPIRATORY_TRACT | Status: DC
  Administered 2021-03-01: 15 ug via RESPIRATORY_TRACT
  Filled 2021-02-28 (×3): qty 2

## 2021-02-28 MED ORDER — ACETAMINOPHEN 325 MG PO TABS: 650.0000 mg | ORAL_TABLET | Freq: Four times a day (QID) | ORAL | Status: DC | PRN

## 2021-02-28 MED ORDER — ENOXAPARIN SODIUM 40 MG/0.4ML IJ SOSY
40.0000 mg | PREFILLED_SYRINGE | INTRAMUSCULAR | Status: DC
  Administered 2021-03-01 – 2021-03-02 (×2): 40 mg via SUBCUTANEOUS
  Filled 2021-02-28 (×2): qty 0.4

## 2021-02-28 MED ORDER — HYDROMORPHONE HCL 1 MG/ML IJ SOLN
1.0000 mg | INTRAMUSCULAR | Status: DC | PRN
  Administered 2021-03-01 (×3): 1 mg via INTRAVENOUS
  Filled 2021-02-28 (×3): qty 1

## 2021-02-28 MED ORDER — LACTATED RINGERS IV SOLN: INTRAVENOUS | Status: DC

## 2021-02-28 MED ORDER — ONDANSETRON HCL 4 MG/2ML IJ SOLN
4.0000 mg | Freq: Once | INTRAMUSCULAR | Status: AC
  Administered 2021-02-28: 4 mg via INTRAVENOUS
  Filled 2021-02-28: qty 2

## 2021-02-28 MED ORDER — POLYETHYLENE GLYCOL 3350 17 G PO PACK: 17.0000 g | PACK | Freq: Every day | ORAL | Status: DC | PRN

## 2021-02-28 MED ORDER — ALBUTEROL SULFATE HFA 108 (90 BASE) MCG/ACT IN AERS
2.0000 | INHALATION_SPRAY | Freq: Four times a day (QID) | RESPIRATORY_TRACT | Status: DC | PRN
  Filled 2021-02-28: qty 6.7

## 2021-02-28 MED ORDER — CLOPIDOGREL BISULFATE 75 MG PO TABS
75.0000 mg | ORAL_TABLET | Freq: Every day | ORAL | Status: DC
  Administered 2021-03-01 – 2021-03-02 (×2): 75 mg via ORAL
  Filled 2021-02-28 (×2): qty 1

## 2021-02-28 MED ORDER — OXYCODONE-ACETAMINOPHEN 5-325 MG PO TABS
1.0000 | ORAL_TABLET | ORAL | Status: DC | PRN
  Administered 2021-03-01 – 2021-03-02 (×6): 1 via ORAL
  Filled 2021-02-28 (×6): qty 1

## 2021-02-28 NOTE — ED Notes (Signed)
ED TO INPATIENT HANDOFF REPORT  Name/Age/Gender [name] 65 y.o. male  Code Status Code Status History    Date Active Date Inactive Code Status Order ID Comments User Context   02/24/2021 1723 02/27/2021 1705 Full Code [id number]  Oswald Hillock, MD Inpatient   02/16/2021 1907 02/19/2021 2234 Full Code [id number]  Mercy Riding, MD ED   12/24/2020 1248 01/07/2021 1755 Full Code [id number]  Antony Odea, PA-C Inpatient   11/07/2019 1741 11/08/2019 1754 Full Code [id number]  [name], PA-C Inpatient   04/11/2019 1644 04/12/2019 1531 Full Code [id number]  [name], MD Inpatient   10/17/2018 1539 10/18/2018 1725 Full Code [id number]  Traci Sermon, PA-C Inpatient   03/24/2018 2247 03/26/2018 1818 Full Code [id number]  [name], MD Inpatient   03/24/2018 1553 03/24/2018 2247 Full Code [id number]  [name], MD ED   02/07/2018 1202 02/09/2018 2055 Full Code [id number]  [name], [place] [facility]   11/13/2017 0727 11/13/2017 1608 Full Code [id number]  [name], PA-C ED   10/04/2017 1707 10/06/2017 1624 Full Code [id number]  [name], DO ED   06/21/2017 1455 06/22/2017 2228 Full Code [id number]  [name], NP ED   03/10/2017 1219 03/13/2017 1440 Full Code [id number]  [name], NP ED   01/04/2017 1955 01/05/2017 2032 Full Code [id number]  [name], MD Inpatient   11/09/2016 1045 11/10/2016 2007 Full Code [id number]  [name], MD ED   09/07/2016 1822 09/09/2016 1835 Full Code [id number]  [name], NP Inpatient   06/10/2016 2358 06/15/2016 1737 Full Code [id number]  [name], MD Inpatient   04/19/2016 1854 04/21/2016 2044 Full Code [id number]  [name], NP Inpatient   03/07/2015 1755 03/08/2015 1441 Full Code [id number]  [name], MD Inpatient   11/19/2012 0037 11/20/2012 2131 Full Code [id number]  [name]., DO Inpatient    Questions for Most Recent Historical Code Status (Order [id number])        Home/SNF/Other Home  Chief Complaint Hypoxia [R09.02]  Level of Care/Admitting Diagnosis ED Disposition    ED Disposition  Admit   Condition  --   Comment  Hospital Area: [facility] [[id number]]  Level of Care: Telemetry [5]  Admit to tele based on following criteria: Other see comments  Comments: hypoxia  May admit patient to [facility] or [facility] if equivalent level of care is available:: Yes  Covid Evaluation: Confirmed COVID Negative  Diagnosis: Hypoxia [758832]  Admitting Physician: Eben Burow [[id number]]  Attending Physician: Eben Burow [[id number]]  Estimated length of stay: past midnight tomorrow  Certification:: I certify this patient will need inpatient services for at least 2 midnights         Medical History Past Medical History:  Diagnosis Date  . Anxiety   . Arthritis   . Basal cell carcinoma (BCC) of forehead   . CAD (coronary artery disease)    a. 10/2015 ant STEMI >> LHC with 3 v CAD; oLAD tx with POBA >> emergent CABG. b. Multiple evals since that time, early graft failure of SVG-RCA by cath 03/2016. c. 2/19 PCI/DES x1 to pRCA, normal EF.  . Carotid artery disease ([place])    a. 40-59% BICA 02/2018.  Marland Kitchen Depression   . Dyspnea   . Ectopic atrial tachycardia ([place])   . Esophageal reflux    eosinophil esophagitis  . Family history of adverse reaction to anesthesia    "  sister has PONV" (06/21/2017)  . Former tobacco use   . Gout   . Hepatitis C    "treated and cured" (06/21/2017)  . High cholesterol   . History of kidney stones   . Hypertension   . Ischemic cardiomyopathy    a. EF 25-30% at intraop TEE 4/17  //  b. Limited Echo 5/17 - EF 45-50%, mild ant HK. c. EF 55-65% by cath 09/2017.  . Migraine    "3-4/yr" (06/21/2017)  . Myocardial infarction ([place]) 10/2015  . Palpitations   . Sinus bradycardia    a. HR dropping into 40s in 02/2016 -> BB reduced.  . Stroke ([place]) 10/2016   "small one; sometimes my  memory/cognitive issues" (06/21/2017)  . Symptomatic hypotension    a. 02/2016 ER visit -> meds reduced.  . Syncope   . Wears dentures   . Wears glasses     Allergies Allergies  Allergen Reactions  . Prednisone Other (See Comments)    States that this med makes him "crazy"  . Tetanus Toxoids Swelling and Other (See Comments)    Fever, Swelling of the arm   . Wellbutrin [Bupropion] Other (See Comments)    Crazy thoughts, nightmares  . Indomethacin Other (See Comments)    rectal bleeding  . Other     Other reaction(s): Unknown  . Scallops [Shellfish Allergy] Nausea Only  . Varenicline Other (See Comments)    Dreams Other reaction(s): Unknown Other reaction(s): Unknown    IV Location/Drains/Wounds Patient Lines/Drains/Airways Status    Active Line/Drains/Airways    Name Placement date Placement time Site Days   Implanted Port 02/06/21 Right 02/06/21  --  --  22   Peripheral IV 02/24/21 20 G 1.16" Right Antecubital 02/24/21  0840  Antecubital  4   Incision (Closed) 12/24/20 Chest Right 12/24/20  0743  -- 66          Labs/Imaging Results for orders placed or performed during the hospital encounter of 02/28/21 (from the past 48 hour(s))  Comprehensive metabolic panel     Status: Abnormal   Collection Time: 02/28/21  6:40 PM  Result Value Ref Range   Sodium 137 135 - 145 mmol/L   Potassium 3.9 3.5 - 5.1 mmol/L   Chloride 103 98 - 111 mmol/L   CO2 24 22 - 32 mmol/L   Glucose, Bld 106 (H) 70 - 99 mg/dL    Comment: Glucose reference range applies only to samples taken after fasting for at least 8 hours.   BUN 17 8 - 23 mg/dL   Creatinine, Ser 1.60 (H) 0.61 - 1.24 mg/dL   Calcium 9.4 8.9 - 10.3 mg/dL   Total Protein 7.2 6.5 - 8.1 g/dL   Albumin 3.9 3.5 - 5.0 g/dL   AST 21 15 - 41 U/L   ALT 19 0 - 44 U/L   Alkaline Phosphatase 79 38 - 126 U/L   Total Bilirubin 0.5 0.3 - 1.2 mg/dL   GFR, Estimated 48 (L) >60 mL/min    Comment: (NOTE) Calculated using the CKD-EPI  Creatinine Equation (2021)    Anion gap 10 5 - 15    Comment: Performed at [facility], [place] [address] [place], [place] [address]  Protime-INR     Status: None   Collection Time: 02/28/21  6:40 PM  Result Value Ref Range   Prothrombin Time 13.0 11.4 - 15.2 seconds   INR 1.0 0.8 - 1.2    Comment: (NOTE) INR goal varies based on device  and disease states. Performed at [facility], [place] [address] [place], [place] [address]   CBC with Differential     Status: Abnormal   Collection Time: 02/28/21  6:40 PM  Result Value Ref Range   WBC 15.6 (H) 4.0 - 10.5 K/uL   RBC 3.81 (L) 4.22 - 5.81 MIL/uL   Hemoglobin 11.3 (L) 13.0 - 17.0 g/dL   HCT 35.0 (L) 39.0 - 52.0 %   MCV 91.9 80.0 - 100.0 fL   MCH 29.7 26.0 - 34.0 pg   MCHC 32.3 30.0 - 36.0 g/dL   RDW 13.2 11.5 - 15.5 %   Platelets 319 150 - 400 K/uL   nRBC 0.0 0.0 - 0.2 %   Neutrophils Relative % 69 %   Neutro Abs 10.8 (H) 1.7 - 7.7 K/uL   Lymphocytes Relative 13 %   Lymphs Abs 2.1 0.7 - 4.0 K/uL   Monocytes Relative 8 %   Monocytes Absolute 1.3 (H) 0.1 - 1.0 K/uL   Eosinophils Relative 1 %   Eosinophils Absolute 0.1 0.0 - 0.5 K/uL   Basophils Relative 0 %   Basophils Absolute 0.1 0.0 - 0.1 K/uL   WBC Morphology      MODERATE LEFT SHIFT (>5% METAS AND MYELOS,OCC PRO NOTED)   Immature Granulocytes 9 %   Abs Immature Granulocytes 1.34 (H) 0.00 - 0.07 K/uL   Reactive, Benign Lymphocytes PRESENT     Comment: Performed at [facility], [place] [address] [place], [place] [address]  Troponin I (High Sensitivity)     Status: None   Collection Time: 02/28/21  6:40 PM  Result Value Ref Range   Troponin I (High Sensitivity) 4 <18 ng/L    Comment: (NOTE) Elevated high sensitivity troponin I (hsTnI) values and significant  changes across serial measurements may suggest ACS but many other  chronic and acute conditions are known to elevate hsTnI results.  Refer to the "Links"  section for chest pain algorithms and additional  guidance. Performed at [facility], [place] [address] [place], [place] [address]   Troponin I (High Sensitivity)     Status: None   Collection Time: 02/28/21  8:00 PM  Result Value Ref Range   Troponin I (High Sensitivity) 5 <18 ng/L    Comment: (NOTE) Elevated high sensitivity troponin I (hsTnI) values and significant  changes across serial measurements may suggest ACS but many other  chronic and acute conditions are known to elevate hsTnI results.  Refer to the "Links" section for chest pain algorithms and additional  guidance. 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 Pt was discharged yesterday. EXAM: CT ANGIOGRAPHY CHEST WITH CONTRAST TECHNIQUE: Multidetector CT imaging of the chest was performed using the standard protocol during bolus administration of intravenous contrast. Multiplanar CT image reconstructions and MIPs were obtained to evaluate the vascular anatomy. CONTRAST:  51mL OMNIPAQUE IOHEXOL 350 MG/ML SOLN COMPARISON:  CT angiography chest 02/24/2021, 02/14/2021 FINDINGS: Cardiovascular: Fairly satisfactory opacification of the pulmonary arteries to the segmental level. Limited evaluation of the subsegmental level due to motion artifact. No evidence of pulmonary embolism. The main pulmonary artery is normal in caliber. Normal heart size. No significant pericardial effusion. The thoracic aorta is  stable in caliber measuring up to 3.8 cm on axial imaging (5:135). At least mild atherosclerotic plaque of the thoracic aorta. Coronary artery calcifications status post coronary artery bypass graft. Mediastinum/Nodes: No enlarged mediastinal,  hilar, or axillary lymph nodes. Thyroid gland, trachea, and esophagus demonstrate no significant findings. Lungs/Pleura: Status post right upper lobectomy. Persistent mucous plugging noted within the right lower lobe. Interval decrease in size of a trace right pleural effusion. Passive atelectasis of the right lower lobe. No left pleural effusion. Linear atelectasis of the right middle lobe. No focal consolidation. No pulmonary nodule, no pulmonary mass. Upper Abdomen: No acute abnormality. A couple of splenules are again noted and partially visualized (282, 5:318) Musculoskeletal: No chest wall abnormality. No suspicious lytic or blastic osseous lesions. No acute displaced fracture. Multilevel degenerative changes of the spine. Anterior surgical hardware of the C7-T1 level. Review of the MIP images confirms the above findings. IMPRESSION: 1. No pulmonary embolus. 2. Interval decrease in trace right pleural effusion in a patient status post right upper lobectomy. 3. Persistent right lower lobe bronchial debris/mucous plugging. No associated parenchymal or interstitial infection. 4.  Aortic Atherosclerosis (ICD10-I70.0). Electronically Signed   By: Iven Finn M.D.   On: 02/28/2021 23:12   DG Chest Port 1 View  Result Date: 02/28/2021 CLINICAL DATA:  Pt c/o SOB w/ exertion and chronic upper abdominal pain. H/o CAD, Stroke, Myocardial Infarction. Former smoker. dyspnea EXAM: PORTABLE CHEST 1 VIEW COMPARISON:  02/25/2021 FINDINGS: Port in the anterior chest wall with tip in distal SVC. Sternotomy wires overlie normal cardiac silhouette. Normal pulmonary vasculature. Small chronic RIGHT effusion. No infiltrate or pneumothorax. No acute osseous abnormality. Anterior cervical fusion IMPRESSION: 1. Small chronic RIGHT effusion. 2. No pneumonia or edema. Electronically Signed   By: Suzy Bouchard M.D.   On: 02/28/2021 19:19    Pending Labs Unresulted Labs (From admission, onward)    Start     Ordered    02/28/21 2302  Resp Panel by RT-PCR (Flu A&B, Covid) Nasopharyngeal Swab  (Tier 2 - Symptomatic/asymptomatic with Precautions )  Once,   STAT       Question Answer Comment  Is this test for diagnosis or screening Screening   Symptomatic for COVID-19 as defined by CDC No   Hospitalized for COVID-19 No   Admitted to ICU for COVID-19 No   Previously tested for COVID-19 Yes   Resident in a congregate (group) care setting No   Employed in healthcare setting No   Has patient completed COVID vaccination(s) (2 doses of Pfizer/Moderna 1 dose of [employer]) Unknown      02/28/21 2302   02/28/21 1800  Culture, blood (routine x 2)  BLOOD CULTURE X 2,   STAT      02/28/21 1800   Signed and Held  CBC  (enoxaparin (LOVENOX)    CrCl >/= 30 ml/min)  Once,   R       Comments: Baseline for enoxaparin therapy IF NOT ALREADY DRAWN.  Notify MD if PLT < 100 K.    Signed and Held   Signed and Held  Creatinine, serum  (enoxaparin (LOVENOX)    CrCl >/= 30 ml/min)  Once,   R       Comments: Baseline for enoxaparin therapy IF NOT ALREADY DRAWN.    Signed and Held   Signed and Held  Creatinine, serum  (enoxaparin (LOVENOX)    CrCl >/= 30 ml/min)  Weekly,   R     Comments: while on enoxaparin  therapy    Signed and Held   Signed and Held  Basic metabolic panel  Tomorrow morning,   R        Signed and Held   Signed and Held  CBC  Tomorrow morning,   R        Signed and Held          Vitals/Pain Today's Vitals   02/28/21 2130 02/28/21 2230 02/28/21 2307 02/28/21 2310  BP: 114/76 110/84  125/75  Pulse: 96 97  92  Resp: 18 17  16   Temp:      TempSrc:      SpO2: 94% 96%  97%  PainSc:   7      Isolation Precautions Airborne and Contact precautions  Medications Medications  ondansetron (ZOFRAN) injection 4 mg (4 mg Intravenous Given 02/28/21 2012)  oxyCODONE (Oxy IR/ROXICODONE) immediate release tablet 5 mg (5 mg Oral Given 02/28/21 2012)  morphine 4 MG/ML injection 4 mg (4 mg Intravenous Given  02/28/21 2308)  sodium chloride (PF) 0.9 % injection (  Given by Other 02/28/21 2307)  iohexol (OMNIPAQUE) 350 MG/ML injection 80 mL (80 mLs Intravenous Contrast Given 02/28/21 2235)    Mobility walks

## 2021-02-28 NOTE — ED Notes (Signed)
The pt has been ambulated with little to no assistance. The pt's SPO2 stayed between 85-92% on room air while walking. Pt appeared SOB when ambulating. After resting in bed, SPO2 was 98%.

## 2021-02-28 NOTE — ED Triage Notes (Signed)
Per EMS, Pt, from home, c/o SOB w/ exertion and chronic upper abdominal pain.  Pt was discharged yesterday.  NAD noted.  Pt easily speaking full sentences.  EMS reports Pt's O2 dropped when walking up step at home.

## 2021-02-28 NOTE — ED Provider Notes (Signed)
[facility] DEPT Provider Note   CSN: [id number] Arrival date & time: 02/28/21  1740     History Chief Complaint  Patient presents with   Shortness of Breath   Anthony Ctr    [name] is a 65 y.o. male.  65 year old male with prior medical history as detailed below presents for evaluation.  Patient reports that he was discharged yesterday evening from this facility.  He went home.  He reports that his dyspnea that he experienced in the hospital has not significantly improved.  He reports continued discomfort with exertion.  He denies dyspnea at rest.  He denies associated fever or chest pain.    The history is provided by the patient and medical records.  Shortness of Breath Severity:  Mild Onset quality:  Gradual Timing:  Constant Progression:  Waxing and waning Chronicity:  Recurrent Context: activity   Relieved by:  Rest Worsened by:  Exertion     Past Medical History:  Diagnosis Date   Anxiety    Arthritis    Basal cell carcinoma (BCC) of forehead    CAD (coronary artery disease)    a. 10/2015 ant STEMI >> LHC with 3 v CAD; oLAD tx with POBA >> emergent CABG. b. Multiple evals since that time, early graft failure of SVG-RCA by cath 03/2016. c. 2/19 PCI/DES x1 to pRCA, normal EF.   Carotid artery disease ([place])    a. 40-59% BICA 02/2018.   Depression    Dyspnea    Ectopic atrial tachycardia (HCC)    Esophageal reflux    eosinophil esophagitis   Family history of adverse reaction to anesthesia    "sister has PONV" (06/21/2017)   Former tobacco use    Gout    Hepatitis C    "treated and cured" (06/21/2017)   High cholesterol    History of kidney stones    Hypertension    Ischemic cardiomyopathy    a. EF 25-30% at intraop TEE 4/17  //  b. Limited Echo 5/17 - EF 45-50%, mild ant HK. c. EF 55-65% by cath 09/2017.   Migraine    "3-4/yr" (06/21/2017)   Myocardial infarction ([place]) 10/2015   Palpitations    Sinus bradycardia    a.  HR dropping into 40s in 02/2016 -> BB reduced.   Stroke [facility]) 10/2016   "small one; sometimes my memory/cognitive issues" (06/21/2017)   Symptomatic hypotension    a. 02/2016 ER visit -> meds reduced.   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   Acute back pain with sciatica 01/12/2021   Allergic rhinitis 01/12/2021   Amnesia 01/12/2021   Kidney stone 01/12/2021   Sciatica 01/12/2021   Bipolar disorder ([place]) 01/12/2021   S/P lobectomy of lung 12/24/2020   Solitary lung nodule 11/28/2020   Chest pain 11/07/2019   Gastroesophageal reflux disease    Cervical radiculopathy 10/17/2018   Preoperative  clearance 10/04/2018   Palpitations    Coronary artery disease involving coronary bypass graft of native heart with angina pectoris ([place])    Transient loss of consciousness 03/24/2018   Ectopic atrial tachycardia ([place]) 02/09/2018   Central chest pain 03/10/2017   Family hx-stroke 11/10/2016   Stroke-like episode ([place]) - R brain, s/p tPA 11/09/2016   Unstable angina ([place]) 09/07/2016   Claudication of both lower extremities ([place])    Pure hypercholesterolemia    Tobacco abuse disorder    CAD of autologous artery bypass graft without angina    Chest pain at rest 06/10/2016   Abnormal nuclear stress test - HIGH RISK 04/20/2016   Old MI (myocardial infarction)    Essential  hypertension 02/26/2016   Ischemic cardiomyopathy 12/25/2015   Hyperlipidemia LDL goal <70 12/25/2015   Mild tobacco abuse in early remission 11/28/2015   Coronary artery disease involving native coronary artery of native heart with angina pectoris ([place]) 11/28/2015   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   Acute MI anterior wall first episode care [facility])    Precordial chest pain 03/07/2015   Gout attack 03/07/2015   Mixed bipolar I disorder ([place]) 03/07/2015   Fibromyalgia 07/09/2014   Gout 07/09/2014   Anxiety 07/09/2014   Depression 07/09/2014   Hepatitis C 28/31/5176   Eosinophilic esophagitis 16/02/3709    Past Surgical History:  Procedure Laterality Date   25 GAUGE PARS PLANA VITRECTOMY WITH 20 GAUGE MVR PORT  12/31/2020   Procedure: 25 GAUGE PARS PLANA VITRECTOMY WITH 20 GAUGE MVR PORT;  Surgeon: [name], MD;  Location: [facility] OR;  Service: Thoracic;;   ANTERIOR CERVICAL DECOMP/DISCECTOMY FUSION N/A 10/17/2018   Procedure: Anterior Cervical Decompression Fusion - Cervical seven -Thoracic one;  Surgeon: Consuella Lose, MD;  Location: [place];  Service: Neurosurgery;  Laterality: N/A;   BASAL CELL CARCINOMA EXCISION     "forehead   BIOPSY  07/20/2019   Procedure: BIOPSY;  Surgeon: Carol Ada, MD;  Location: [facility];  Service: Endoscopy;;   BRONCHIAL BIOPSY  12/24/2020   Procedure: BRONCHIAL BIOPSIES;  Surgeon: Garner Nash, DO;  Location: [place] [facility]COPY;  Service: Pulmonary;;   BRONCHIAL BRUSHINGS  12/24/2020   Procedure: BRONCHIAL BRUSHINGS;  Surgeon: Garner Nash, DO;  Location: [place];  Service: Pulmonary;;   BRONCHIAL NEEDLE ASPIRATION BIOPSY  12/24/2020   Procedure: BRONCHIAL NEEDLE ASPIRATION BIOPSIES;  Surgeon: Garner Nash, DO;  Location: [place];  Service: Pulmonary;;   CARDIAC CATHETERIZATION N/A 11/28/2015   Procedure: Left Heart Cath and Coronary Angiography;  Surgeon: [name], MD;  Location: [place] CV LAB;  Service: Cardiovascular;   Laterality: N/A;   CARDIAC CATHETERIZATION N/A 11/28/2015   Procedure: Coronary Balloon Angioplasty;  Surgeon: [name], MD;  Location: [place] CV LAB;  Service: Cardiovascular;  Laterality: N/A;  ostial LAD   CARDIAC CATHETERIZATION N/A 11/28/2015   Procedure: Coronary/Graft Angiography;  Surgeon: [name], MD;  Location: [place] CV LAB;  Service: Cardiovascular;  Laterality: N/A;  coronaries only    CARDIAC CATHETERIZATION N/A 04/21/2016   Procedure: Left Heart Cath and Coronary Angiography;  Surgeon: Wellington Hampshire, MD;  Location: [place] CV LAB;  Service: Cardiovascular;  Laterality: N/A;   CARDIAC CATHETERIZATION N/A 06/14/2016   Procedure: Left Heart Cath and Cors/Grafts Angiography;  Surgeon: Lorretta Harp, MD;  Location: [place] [facility] LAB;  Service: Cardiovascular;  Laterality: N/A;   CARDIAC CATHETERIZATION N/A 09/08/2016   Procedure: Left Heart Cath and Cors/Grafts  Angiography;  Surgeon: Wellington Hampshire, MD;  Location: [place] CV LAB;  Service: Cardiovascular;  Laterality: N/A;   CARDIAC CATHETERIZATION     CORONARY ARTERY BYPASS GRAFT N/A 11/28/2015   Procedure: CORONARY ARTERY BYPASS GRAFTING (CABG) TIMES FIVE USING LEFT INTERNAL MAMMARY ARTERY AND RIGHT GREATER SAPHENOUS,VIEN HARVEATED BY ENDOVIEN, INTRAOPPRATIVE TEE;  Surgeon: Gaye Pollack, MD;  Location: [place];  Service: Open Heart Surgery;  Laterality: N/A;   CORONARY STENT INTERVENTION N/A 10/05/2017   Procedure: CORONARY STENT INTERVENTION;  Surgeon: [name], MD;  Location: [place] CV LAB;  Service: Cardiovascular;  Laterality: N/A;   ESOPHAGOGASTRODUODENOSCOPY (EGD) WITH PROPOFOL N/A 07/20/2019   Procedure: ESOPHAGOGASTRODUODENOSCOPY (EGD) WITH PROPOFOL;  Surgeon: Carol Ada, MD;  [facility];  Service: Endoscopy;  Laterality: N/A;   ESOPHAGOGASTRODUODENOSCOPY (EGD) WITH PROPOFOL N/A 01/30/2021   Procedure: ESOPHAGOGASTRODUODENOSCOPY (EGD) WITH PROPOFOL;   Surgeon: Carol Ada, MD;  [facility];  Service: Endoscopy;  Laterality: N/A;   FIDUCIAL MARKER PLACEMENT  12/24/2020   Procedure: FIDUCIAL DYE MARKER PLACEMENT;  Surgeon: Garner Nash, DO;  Location: [place] ENDOSCOPY;  Service: Pulmonary;;   HUMERUS SURGERY Right 1969   "tumor inside bone; filled it w/bone chips"   INTERCOSTAL NERVE BLOCK Right 12/24/2020   Procedure: INTERCOSTAL NERVE BLOCK;  Surgeon: [name], MD;  Location: [place];  Service: Thoracic;  Laterality: Right;   IR IMAGING GUIDED PORT INSERTION  02/06/2021   LEFT HEART CATH AND CORS/GRAFTS ANGIOGRAPHY N/A 03/11/2017   Procedure: Left Heart Cath and Cors/Grafts Angiography;  Surgeon: Leonie Man, MD;  Location: [place] CV LAB;  Service: Cardiovascular;  Laterality: N/A;   LEFT HEART CATH AND CORS/GRAFTS ANGIOGRAPHY N/A 10/05/2017   Procedure: LEFT HEART CATH AND CORS/GRAFTS ANGIOGRAPHY;  Surgeon: [name], MD;  Location: [place] CV LAB;  Service: Cardiovascular;  Laterality: N/A;   LEFT HEART CATH AND CORS/GRAFTS ANGIOGRAPHY N/A 04/11/2019   Procedure: LEFT HEART CATH AND CORS/GRAFTS ANGIOGRAPHY;  Surgeon: [name], MD;  Location: [place] [facility] LAB;  Service: Cardiovascular;  Laterality: N/A;   NODE DISSECTION Right 12/24/2020   Procedure: NODE DISSECTION;  Surgeon: [name], MD;  Location: [place];  Service: Thoracic;  Laterality: Right;   PERIPHERAL VASCULAR CATHETERIZATION N/A 06/14/2016   Procedure: Lower Extremity Angiography;  Surgeon: Lorretta Harp, MD;  Location: [place] CV LAB;  Service: Cardiovascular;  Laterality: N/A;   VIDEO BRONCHOSCOPY WITH ENDOBRONCHIAL NAVIGATION Right 12/24/2020   Procedure: VIDEO BRONCHOSCOPY WITH ENDOBRONCHIAL NAVIGATION;  Surgeon: Garner Nash, DO;  Location: [place];  Service: Pulmonary;  Laterality: Right;   VIDEO BRONCHOSCOPY WITH ENDOBRONCHIAL ULTRASOUND N/A 12/24/2020   Procedure: VIDEO BRONCHOSCOPY WITH  ENDOBRONCHIAL ULTRASOUND;  Surgeon: Garner Nash, DO;  Location: [place];  Service: Pulmonary;  Laterality: N/A;   VIDEO BRONCHOSCOPY WITH INSERTION OF INTERBRONCHIAL VALVE (IBV) N/A 12/31/2020   Procedure: VIDEO BRONCHOSCOPY WITH INSERTION OF INTERBRONCHIAL VALVE (IBV).VALVE IN CARTRIDGE 83mm,9mm. CHEST TUBE PLACEMENT.;  Surgeon: [name], MD;  [facility];  Service: Thoracic;  Laterality: N/A;       Family History  Problem Relation Age of Onset   Lung cancer Mother    Heart Problems Father    Heart attack Father 68   Stroke Father    Heart failure Father    Heart attack Maternal Grandmother    Stroke Maternal Grandmother    Heart attack Paternal Uncle    Hypertension Brother    Autoimmune disease  Neg Hx     Social History   Tobacco Use   Smoking status: Former    Packs/day: 0.75    Years: 44.00    Pack years: 33.00    Types: Cigarettes    Quit date: 11/28/2015    Years since quitting: 5.2   Smokeless tobacco: Never  Vaping Use   Vaping Use: Never used  Substance Use Topics   Alcohol use: Yes    Comment: occ   Drug use: No    Comment: 06/21/2017 "nothing since the 1980s"    Home Medications Prior to Admission medications   Medication Sig Start Date End Date Taking? Authorizing Provider  acetaminophen (TYLENOL) 500 MG tablet Take 1,000 mg by mouth every 6 (six) hours as needed for moderate pain.    [provider]  albuterol (VENTOLIN HFA) 108 (90 Base) MCG/ACT inhaler Inhale 2 puffs into the lungs every 6 (six) hours as needed for wheezing or shortness of breath. 01/12/21   Garner Nash, DO  amiodarone (PACERONE) 200 MG tablet Take 1 tablet (200 mg total) by mouth daily. 01/07/21   Elgie Collard, PA-C  amLODipine (NORVASC) 10 MG tablet TAKE 1 TABLET(10 MG) BY MOUTH DAILY 12/02/20   [name], MD  aspirin EC 81 MG tablet Take 81 mg by mouth daily. Swallow whole.    [provider]  atorvastatin (LIPITOR) 80 MG tablet  TAKE 1 TABLET(80 MG) BY MOUTH DAILY 12/10/19   [name], MD  clopidogrel (PLAVIX) 75 MG tablet TAKE 1 TABLET BY MOUTH EVERY DAY 02/06/20   [name], MD  fluticasone [facility]) 50 MCG/ACT nasal spray Place 2 sprays into both nostrils daily as needed for allergies. 02/27/21   Aline August, MD  lidocaine-prilocaine (EMLA) cream Apply 1 application topically once as needed (port access). 02/27/21   Aline August, MD  Magnesium 100 MG TABS Take 100 mg by mouth daily.    [provider]  metoprolol succinate (TOPROL-XL) 25 MG 24 hr tablet Take 1 tablet (25 mg total) by mouth daily. 08/27/20   [name], MD  Multiple Vitamin (MULTIVITAMIN WITH MINERALS) TABS tablet Take 1 tablet by mouth in the morning. Centrum    [provider]  nitroGLYCERIN (NITROSTAT) 0.4 MG SL tablet PLACE 1 TABLET UNDER THE TONGUE EVERY 5 MINUTES AS NEEDED FOR CHEST PAIN. 3 DOSES MAX 04/16/20   [name], MD  ondansetron (ZOFRAN) 8 MG tablet Take 1 tablet (8 mg total) by mouth every 8 (eight) hours as needed for nausea or vomiting. Starting 3 days after chemotherapy 02/13/21   Heilingoetter, Cassandra L, PA-C  oxyCODONE-acetaminophen (PERCOCET/ROXICET) 5-325 MG tablet Take 1 tablet by mouth every 4 (four) hours as needed for severe pain. 02/19/21   Oswald Hillock, MD  polyethylene glycol (MIRALAX / GLYCOLAX) 17 g packet Take 17 g by mouth daily as needed. 02/27/21   Aline August, MD  prochlorperazine (COMPAZINE) 10 MG tablet Take 1 tablet (10 mg total) by mouth every 6 (six) hours as needed. 01/14/21   Heilingoetter, Cassandra L, PA-C  ranolazine (RANEXA) 1000 MG SR tablet Take 1 tablet (1,000 mg total) by mouth 2 (two) times daily. 11/08/19   [name], NP  Tiotropium Bromide-Olodaterol (STIOLTO RESPIMAT) 2.5-2.5 MCG/ACT AERS Inhale 2 puffs into the lungs daily. 01/12/21   Garner Nash, DO    Allergies    Prednisone, Tetanus toxoids, Wellbutrin [bupropion],  Indomethacin, Other, Scallops [shellfish allergy], and Varenicline  Review of Systems  Review of Systems  Respiratory:  Positive for shortness of breath.   All other systems reviewed and are negative.  Physical Exam Updated Vital Signs BP 108/75 (BP Location: Right Arm)   Temp 99.4 F (37.4 C) (Oral)   Resp 14   SpO2 96%   Physical Exam Vitals and nursing note reviewed.  Constitutional:      General: He is not in acute distress.    Appearance: Normal appearance. He is well-developed.  HENT:     Head: Normocephalic and atraumatic.  Eyes:     Conjunctiva/sclera: Conjunctivae normal.     Pupils: Pupils are equal, round, and reactive to light.  Cardiovascular:     Rate and Rhythm: Normal rate and regular rhythm.     Heart sounds: Normal heart sounds.  Pulmonary:     Effort: Pulmonary effort is normal. No respiratory distress.     Breath sounds: Normal breath sounds.  Abdominal:     General: There is no distension.     Palpations: Abdomen is soft.     Tenderness: There is no abdominal tenderness.  Musculoskeletal:        General: No deformity. Normal range of motion.     Cervical back: Normal range of motion and neck supple.  Skin:    General: Skin is warm and dry.  Neurological:     General: No focal deficit present.     Mental Status: He is alert and oriented to person, place, and time.    ED Results / Procedures / Treatments   Labs (all labs ordered are listed, but only abnormal results are displayed) Labs Reviewed  COMPREHENSIVE METABOLIC PANEL - Abnormal; Notable for the following components:      Result Value   Glucose, Bld 106 (*)    Creatinine, Ser 1.60 (*)    GFR, Estimated 48 (*)    All other components within normal limits  CBC WITH DIFFERENTIAL/PLATELET - Abnormal; Notable for the following components:   WBC 15.6 (*)    RBC 3.81 (*)    Hemoglobin 11.3 (*)    HCT 35.0 (*)    Neutro Abs 10.8 (*)    Monocytes Absolute 1.3 (*)    Abs Immature  Granulocytes 1.34 (*)    All other components within normal limits  CULTURE, BLOOD (ROUTINE X 2)  CULTURE, BLOOD (ROUTINE X 2)  PROTIME-INR  TROPONIN I (HIGH SENSITIVITY)  TROPONIN I (HIGH SENSITIVITY)    EKG EKG Interpretation  Date/Time:  Saturday February 28 2021 18:33:25 EDT Ventricular Rate:  94 PR Interval:  156 QRS Duration: 88 QT Interval:  366 QTC Calculation: 457 R Axis:   71 Text Interpretation: Normal sinus rhythm Possible Left atrial enlargement Low voltage QRS Borderline ECG Confirmed by Dene Gentry [phone]) on 02/28/2021 6:36:17 PM  Radiology DG Chest Port 1 View  Result Date: 02/28/2021 CLINICAL DATA:  Pt c/o SOB w/ exertion and chronic upper abdominal pain. H/o CAD, Stroke, Myocardial Infarction. Former smoker. dyspnea EXAM: PORTABLE CHEST 1 VIEW COMPARISON:  02/25/2021 FINDINGS: Port in the anterior chest wall with tip in distal SVC. Sternotomy wires overlie normal cardiac silhouette. Normal pulmonary vasculature. Small chronic RIGHT effusion. No infiltrate or pneumothorax. No acute osseous abnormality. Anterior cervical fusion IMPRESSION: 1. Small chronic RIGHT effusion. 2. No pneumonia or edema. Electronically Signed   By: Suzy Bouchard M.D.   On: 02/28/2021 19:19    Procedures Procedures   Medications Ordered in ED Medications - No data to display  ED Course  I  have reviewed the triage vital signs and the nursing notes.  Pertinent labs & imaging results that were available during my care of the patient were reviewed by me and considered in my medical decision making (see chart for details).    MDM Rules/Calculators/A&P                          MDM  MSE complete  [name] was evaluated in Emergency Department on 02/28/2021 for the symptoms described in the history of present illness. He was evaluated in the context of the global COVID-19 pandemic, which necessitated consideration that the patient might be at risk for infection with the SARS-CoV-2  virus that causes COVID-19. Institutional protocols and algorithms that pertain to the evaluation of patients at risk for COVID-19 are in a state of rapid change based on information released by regulatory bodies including the CDC and federal and state organizations. These policies and algorithms were followed during the patient's care in the ED.  Patient presents with complaint of dyspnea with exertion.  Patient was just discharged yesterday after work-up for same.  Patient with recent pleural effusion and thoracentesis (removal of 220 cc).  Patient's imaging (6/28) from last admission did not show evidence of PE or lung pathology other than right pleural effusion.  Patient reports that he went home last night from the hospital.  He complains of continued dyspnea with exertion.  Ambulatory room air sats dropped into the mid 80s with minimal exertion here in the ED  Case discussed with the hospitalist service and they will evaluate for re-admission.   Final Clinical Impression(s) / ED Diagnoses Final diagnoses:  Dyspnea, unspecified type    Rx / DC Orders ED Discharge Orders     None        Valarie Merino, MD 02/28/21 2258

## 2021-02-28 NOTE — H&P (Signed)
History and Physical    [name] YKD:[id number] DOB: 1955-11-01 DOA: 02/28/2021  PCP: [name], MD   Patient coming from:  Home  Chief Complaint: SOB  HPI: [name] is a 65 y.o. 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 fib not on anticoagulation, CKD 3 recently discharged from the hospital for acute kidney injury and low back pain and was found to have L3-L4 disc protrusion for which neurosurgery recommended outpatient follow-up presented with dyspnea on exertion. During the hospitalization, he underwent right-sided thoracentesis and removal of 220 cc of pleural fluid.  Cultures have been negative so far. He received Granix per oncology for neutropenia and WBC improved.  He reports since being discharged on 02/27/21 he has had fatigue and SOB with exertion. He has an oximeter at home and states that he would check his oxygen saturation and when he was ambulating and exerting himself it would drop into the mid 80s.  States he did not have any fever or chills.  He denies having any nausea, vomiting, diarrhea.  He denies any chest pain or palpitations.  He states that he expected to be fatigued but he did not expect to have such significant dyspnea with exertion and he feels that something is not right.  He reports if he walks up a few steps he would be gasping for air which is not normal for him.  With his O2 sats being low and the significant dyspnea he came back to the emergency room for evaluation.  ED Course: In the emergency room patient's been hemodynamically stable although he does have low oxygen saturation when ambulated on room air.  Oxygen saturation were dropping to the mid 80's per the ER physician.  CBC showed an increase in his WBC which  was suspected to be secondary to the Granix he received during hospitalization a few days ago.  Otherwise the remainder of the CBC and his electrolytes, renal function and LFTs are unremarkable.  Troponin is 4.  No EKG changes noted.  Chest x-ray shows a small right pleural effusion but no pneumonia, infiltrates or consolidations noted.  CTA of the chest has been ordered and is pending.  Hospitalist service is asked to admit for further management  Review of Systems:  General: Denies fever, chills, weight loss, night sweats.  Denies dizziness. Denies change in appetite HENT: Denies head trauma, headache, denies change in hearing, tinnitus. Denies nasal congestion.  Denies sore throat.  Denies difficulty swallowing Eyes: Denies blurry vision, pain in eye, drainage.  Denies discoloration of eyes. Neck: Denies pain.  Denies swelling.  Denies pain with movement. Cardiovascular: Denies chest pain, palpitations. Denies edema.  Denies orthopnea Respiratory: Reports shortness of breath.  Denies wheezing.  Denies sputum production Gastrointestinal: Denies abdominal pain, swelling.  Denies nausea, vomiting, diarrhea.  Denies melena.  Denies hematemesis. Musculoskeletal: Denies limitation of movement.  Denies deformity or swelling.  Denies pain.  Denies arthralgias or myalgias. Genitourinary: Denies pelvic pain.  Denies urinary frequency or hesitancy.  Denies dysuria.  Skin: Denies rash.  Denies petechiae, purpura, ecchymosis. Neurological: Denies syncope. Denies seizure activity. Denies paresthesia. Denies slurred speech, drooping face. Denies visual change. Psychiatric: Denies depression, anxiety. Denies hallucinations.  Past Medical History:  Diagnosis Date   Anxiety    Arthritis  Basal cell carcinoma (BCC) of forehead    CAD (coronary artery disease)    a. 10/2015 ant STEMI >> LHC with 3 v CAD; oLAD tx with POBA >> emergent CABG. b. Multiple evals since that time, early graft failure of SVG-RCA by  cath 03/2016. c. 2/19 PCI/DES x1 to pRCA, normal EF.   Carotid artery disease ([place])    a. 40-59% BICA 02/2018.   Depression    Dyspnea    Ectopic atrial tachycardia (HCC)    Esophageal reflux    eosinophil esophagitis   Family history of adverse reaction to anesthesia    "sister has PONV" (06/21/2017)   Former tobacco use    Gout    Hepatitis C    "treated and cured" (06/21/2017)   High cholesterol    History of kidney stones    Hypertension    Ischemic cardiomyopathy    a. EF 25-30% at intraop TEE 4/17  //  b. Limited Echo 5/17 - EF 45-50%, mild ant HK. c. EF 55-65% by cath 09/2017.   Migraine    "3-4/yr" (06/21/2017)   Myocardial infarction ([place]) 10/2015   Palpitations    Sinus bradycardia    a. HR dropping into 40s in 02/2016 -> BB reduced.   Stroke [facility]) 10/2016   "small one; sometimes my memory/cognitive issues" (06/21/2017)   Symptomatic hypotension    a. 02/2016 ER visit -> meds reduced.   Syncope    Wears dentures    Wears glasses     Past Surgical History:  Procedure Laterality Date   25 GAUGE PARS PLANA VITRECTOMY WITH 20 GAUGE MVR PORT  12/31/2020   Procedure: 25 GAUGE PARS PLANA VITRECTOMY WITH 20 GAUGE MVR PORT;  Surgeon: [name], MD;  Location: [place];  Service: Thoracic;;   ANTERIOR CERVICAL DECOMP/DISCECTOMY FUSION N/A 10/17/2018   Procedure: Anterior Cervical Decompression Fusion - Cervical seven -Thoracic one;  Surgeon: Consuella Lose, MD;  Location: [place];  Service: Neurosurgery;  Laterality: N/A;   BASAL CELL CARCINOMA EXCISION     "forehead   BIOPSY  07/20/2019   Procedure: BIOPSY;  Surgeon: Carol Ada, MD;  [facility];  Service: Endoscopy;;   BRONCHIAL BIOPSY  12/24/2020   Procedure: BRONCHIAL BIOPSIES;  Surgeon: Garner Nash, DO;  Location: [place] [facility];  Service: Pulmonary;;   BRONCHIAL BRUSHINGS  12/24/2020   Procedure: BRONCHIAL BRUSHINGS;  Surgeon: Garner Nash, DO;  Location: [place];  Service: Pulmonary;;    BRONCHIAL NEEDLE ASPIRATION BIOPSY  12/24/2020   Procedure: BRONCHIAL NEEDLE ASPIRATION BIOPSIES;  Surgeon: Garner Nash, DO;  Location: [place];  Service: Pulmonary;;   CARDIAC CATHETERIZATION N/A 11/28/2015   Procedure: Left Heart Cath and Coronary Angiography;  Surgeon: [name], MD;  Location: [place] [facility];  Service: Cardiovascular;  Laterality: N/A;   CARDIAC CATHETERIZATION N/A 11/28/2015   Procedure: Coronary Balloon Angioplasty;  Surgeon: [name], MD;  Location: [place] [facility];  Service: Cardiovascular;  Laterality: N/A;  ostial LAD   CARDIAC CATHETERIZATION N/A 11/28/2015   Procedure: Coronary/Graft Angiography;  Surgeon: [name], MD;  Location: [place] [facility];  Service: Cardiovascular;  Laterality: N/A;  coronaries only    CARDIAC CATHETERIZATION N/A 04/21/2016   Procedure: Left Heart Cath and Coronary Angiography;  Surgeon: Wellington Hampshire, MD;  Location: [place] [facility];  Service: Cardiovascular;  Laterality: N/A;   CARDIAC CATHETERIZATION N/A 06/14/2016   Procedure: Left Heart Cath and Cors/Grafts Angiography;  Surgeon: Roderic Palau  Adora Fridge, MD;  Location: [place] [facility];  Service: Cardiovascular;  Laterality: N/A;   CARDIAC CATHETERIZATION N/A 09/08/2016   Procedure: Left Heart Cath and Cors/Grafts Angiography;  Surgeon: Wellington Hampshire, MD;  Location: [place] [facility];  Service: Cardiovascular;  Laterality: N/A;   CARDIAC CATHETERIZATION     CORONARY ARTERY BYPASS GRAFT N/A 11/28/2015   Procedure: CORONARY ARTERY BYPASS GRAFTING (CABG) TIMES FIVE USING LEFT INTERNAL MAMMARY ARTERY AND RIGHT GREATER SAPHENOUS,VIEN HARVEATED BY ENDOVIEN, INTRAOPPRATIVE TEE;  Surgeon: Gaye Pollack, MD;  Location: [place];  Service: Open Heart Surgery;  Laterality: N/A;   CORONARY STENT INTERVENTION N/A 10/05/2017   Procedure: CORONARY STENT INTERVENTION;  Surgeon: [name], MD;  Location: [place] [facility];  Service: Cardiovascular;   Laterality: N/A;   ESOPHAGOGASTRODUODENOSCOPY (EGD) WITH PROPOFOL N/A 07/20/2019   Procedure: ESOPHAGOGASTRODUODENOSCOPY (EGD) WITH PROPOFOL;  Surgeon: Carol Ada, MD;  [facility];  Service: Endoscopy;  Laterality: N/A;   ESOPHAGOGASTRODUODENOSCOPY (EGD) WITH PROPOFOL N/A 01/30/2021   Procedure: ESOPHAGOGASTRODUODENOSCOPY (EGD) WITH PROPOFOL;  Surgeon: Carol Ada, MD;  [facility];  Service: Endoscopy;  Laterality: N/A;   FIDUCIAL MARKER PLACEMENT  12/24/2020   Procedure: FIDUCIAL DYE MARKER PLACEMENT;  Surgeon: Garner Nash, DO;  Location: [place] [facility];  Service: Pulmonary;;   HUMERUS SURGERY Right 1969   "tumor inside bone; filled it w/bone chips"   INTERCOSTAL NERVE BLOCK Right 12/24/2020   Procedure: INTERCOSTAL NERVE BLOCK;  Surgeon: [name], MD;  Location: [place];  Service: Thoracic;  Laterality: Right;   IR IMAGING GUIDED PORT INSERTION  02/06/2021   LEFT HEART CATH AND CORS/GRAFTS ANGIOGRAPHY N/A 03/11/2017   Procedure: Left Heart Cath and Cors/Grafts Angiography;  Surgeon: Leonie Man, MD;  Location: [place] [facility];  Service: Cardiovascular;  Laterality: N/A;   LEFT HEART CATH AND CORS/GRAFTS ANGIOGRAPHY N/A 10/05/2017   Procedure: LEFT HEART CATH AND CORS/GRAFTS ANGIOGRAPHY;  Surgeon: [name], MD;  Location: [place] [facility];  Service: Cardiovascular;  Laterality: N/A;   LEFT HEART CATH AND CORS/GRAFTS ANGIOGRAPHY N/A 04/11/2019   Procedure: LEFT HEART CATH AND CORS/GRAFTS ANGIOGRAPHY;  Surgeon: [name], MD;  Location: [place] [facility];  Service: Cardiovascular;  Laterality: N/A;   NODE DISSECTION Right 12/24/2020   Procedure: NODE DISSECTION;  Surgeon: [name], MD;  Location: [place];  Service: Thoracic;  Laterality: Right;   PERIPHERAL VASCULAR CATHETERIZATION N/A 06/14/2016   Procedure: Lower Extremity Angiography;  Surgeon: Lorretta Harp, MD;  Location: [place] [facility];  Service:  Cardiovascular;  Laterality: N/A;   VIDEO BRONCHOSCOPY WITH ENDOBRONCHIAL NAVIGATION Right 12/24/2020   Procedure: VIDEO BRONCHOSCOPY WITH ENDOBRONCHIAL NAVIGATION;  Surgeon: Garner Nash, DO;  Location: [place];  Service: Pulmonary;  Laterality: Right;   VIDEO BRONCHOSCOPY WITH ENDOBRONCHIAL ULTRASOUND N/A 12/24/2020   Procedure: VIDEO BRONCHOSCOPY WITH ENDOBRONCHIAL ULTRASOUND;  Surgeon: Garner Nash, DO;  Location: [place];  Service: Pulmonary;  Laterality: N/A;   VIDEO BRONCHOSCOPY WITH INSERTION OF INTERBRONCHIAL VALVE (IBV) N/A 12/31/2020   Procedure: VIDEO BRONCHOSCOPY WITH INSERTION OF INTERBRONCHIAL VALVE (IBV).VALVE IN CARTRIDGE 9mm,9mm. CHEST TUBE PLACEMENT.;  Surgeon: [name], MD;  [facility];  Service: Thoracic;  Laterality: N/A;    Social History  reports that he quit smoking about 5 years ago. His smoking use included cigarettes. He has a 33.00 pack-year smoking history. He has never used smokeless tobacco. He reports current alcohol use. He reports that he does  not use drugs.  Allergies  Allergen Reactions   Prednisone Other (See Comments)    States that this med makes him "crazy"   Tetanus Toxoids Swelling and Other (See Comments)    Fever, Swelling of the arm    Wellbutrin [Bupropion] Other (See Comments)    Crazy thoughts, nightmares   Indomethacin Other (See Comments)    rectal bleeding   Other     Other reaction(s): Unknown   Scallops [Shellfish Allergy] Nausea Only   Varenicline Other (See Comments)    Dreams Other reaction(s): Unknown Other reaction(s): Unknown    Family History  Problem Relation Age of Onset   Lung cancer Mother    Heart Problems Father    Heart attack Father 63   Stroke Father    Heart failure Father    Heart attack Maternal Grandmother    Stroke Maternal Grandmother    Heart attack Paternal Uncle    Hypertension Brother    Autoimmune disease Neg Hx      Prior to Admission medications   Medication  Sig Start Date End Date Taking? Authorizing Provider  acetaminophen (TYLENOL) 500 MG tablet Take 1,000 mg by mouth every 6 (six) hours as needed for moderate pain.   Yes [provider]  albuterol (VENTOLIN HFA) 108 (90 Base) MCG/ACT inhaler Inhale 2 puffs into the lungs every 6 (six) hours as needed for wheezing or shortness of breath. 01/12/21  Yes Icard, Octavio Graves, DO  amiodarone (PACERONE) 200 MG tablet Take 1 tablet (200 mg total) by mouth daily. Patient taking differently: Take 200 mg by mouth every evening. 01/07/21  Yes Conte, Tessa N, PA-C  amLODipine (NORVASC) 10 MG tablet TAKE 1 TABLET(10 MG) BY MOUTH DAILY 12/02/20  Yes [name], MD  aspirin EC 81 MG tablet Take 81 mg by mouth daily. Swallow whole.   Yes [provider]  atorvastatin (LIPITOR) 80 MG tablet TAKE 1 TABLET(80 MG) BY MOUTH DAILY 12/10/19  Yes [name], MD  clopidogrel (PLAVIX) 75 MG tablet TAKE 1 TABLET BY MOUTH EVERY DAY 02/06/20  Yes [name], MD  fluticasone [facility]) 50 MCG/ACT nasal spray Place 2 sprays into both nostrils daily as needed for allergies. 02/27/21  Yes Aline August, MD  lidocaine-prilocaine (EMLA) cream Apply 1 application topically once as needed (port access). 02/27/21  Yes Aline August, MD  Magnesium 100 MG TABS Take 100 mg by mouth daily.    [provider]  metoprolol succinate (TOPROL-XL) 25 MG 24 hr tablet Take 1 tablet (25 mg total) by mouth daily. 08/27/20   [name], MD  Multiple Vitamin (MULTIVITAMIN WITH MINERALS) TABS tablet Take 1 tablet by mouth in the morning. Centrum    [provider]  nitroGLYCERIN (NITROSTAT) 0.4 MG SL tablet PLACE 1 TABLET UNDER THE TONGUE EVERY 5 MINUTES AS NEEDED FOR CHEST PAIN. 3 DOSES MAX 04/16/20   [name], MD  ondansetron (ZOFRAN) 8 MG tablet Take 1 tablet (8 mg total) by mouth every 8 (eight) hours as needed for nausea or vomiting. Starting 3 days after chemotherapy 02/13/21    Heilingoetter, Cassandra L, PA-C  oxyCODONE-acetaminophen (PERCOCET/ROXICET) 5-325 MG tablet Take 1 tablet by mouth every 4 (four) hours as needed for severe pain. 02/19/21   Oswald Hillock, MD  polyethylene glycol (MIRALAX / GLYCOLAX) 17 g packet Take 17 g by mouth daily as needed. 02/27/21   Aline August, MD  prochlorperazine (COMPAZINE) 10 MG tablet Take 1 tablet (10 mg  total) by mouth every 6 (six) hours as needed. 01/14/21   Heilingoetter, Cassandra L, PA-C  ranolazine (RANEXA) 1000 MG SR tablet Take 1 tablet (1,000 mg total) by mouth 2 (two) times daily. 11/08/19   [name], NP  Tiotropium Bromide-Olodaterol (STIOLTO RESPIMAT) 2.5-2.5 MCG/ACT AERS Inhale 2 puffs into the lungs daily. 01/12/21   Garner Nash, DO    Physical Exam: Vitals:   02/28/21 2000 02/28/21 2030 02/28/21 2100 02/28/21 2130  BP: 105/71 109/65 106/72 114/76  Pulse: 95 93 95 96  Resp: 15 16 17 18   Temp:      TempSrc:      SpO2: 96% 94% 93% 94%    Constitutional: NAD, calm, comfortable Vitals:   02/28/21 2000 02/28/21 2030 02/28/21 2100 02/28/21 2130  BP: 105/71 109/65 106/72 114/76  Pulse: 95 93 95 96  Resp: 15 16 17 18   Temp:      TempSrc:      SpO2: 96% 94% 93% 94%   General: WDWN, Alert and oriented x3.  Eyes: EOMI, PERRL, lids and conjunctivae normal.  Sclera nonicteric HENT:  Liberal/AT, external ears normal.  Nares patent without epistasis.  Mucous membranes are moist. Neck: Soft, normal range of motion, supple, no masses, no thyromegaly.  Trachea midline Respiratory: Diminished breath sounds with diffuse scattered Rales and expiratory wheezing, no crackles. Normal respiratory effort. No accessory muscle use.  Cardiovascular: Regular rate and rhythm, no murmurs / rubs / gallops. No extremity edema. 2+ pedal pulses. Abdomen: Soft, no tenderness, nondistended, no rebound or guarding.  No masses palpated. No hepatosplenomegaly. Bowel sounds normoactive Musculoskeletal: FROM. no cyanosis. Normal  muscle tone.  Skin: Warm, dry, intact no rashes, lesions, ulcers. No induration Neurologic: CN 2-12 grossly intact.  Normal speech.  Sensation intact. Strength 5/5 in all extremities.   Psychiatric: Normal judgment and insight.  Normal mood.    Labs on Admission: I have personally reviewed following labs and imaging studies  CBC: Recent Labs  Lab 02/24/21 0821 02/24/21 1759 02/25/21 0514 02/26/21 1041 02/27/21 0448 02/28/21 1840  WBC 1.5* 2.5* 2.0* 2.2* 5.8 15.6*  NEUTROABS 0.1*  --   --  0.2* 3.1 10.8*  HGB 11.1* 11.4* 10.4* 11.8* 10.5* 11.3*  HCT 33.7* 34.8* 33.6* 37.0* 33.2* 35.0*  MCV 90.3 90.4 92.3 93.2 93.8 91.9  PLT 207 228 193 277 240 132    Basic Metabolic Panel: Recent Labs  Lab 02/24/21 0821 02/24/21 1759 02/25/21 0514 02/26/21 0519 02/27/21 0448 02/28/21 1840  NA 137  --  137 136 137 137  K 4.6  --  4.0 4.0 4.3 3.9  CL 103  --  101 102 105 103  CO2 26  --  28 27 25 24   GLUCOSE 110*  --  106* 111* 113* 106*  BUN 20  --  20 20 16 17   CREATININE 1.56* 1.54* 1.93* 1.60* 1.50* 1.60*  CALCIUM 9.3  --  9.2 8.7* 8.8* 9.4    GFR: Estimated Creatinine Clearance: 46 mL/min (A) (by C-G formula based on SCr of 1.6 mg/dL (H)).  Liver Function Tests: Recent Labs  Lab 02/24/21 0821 02/25/21 0514 02/28/21 1840  AST 19 17 21   ALT 21 20 19   ALKPHOS 69 67 79  BILITOT 0.4 0.4 0.5  PROT 6.8 6.4* 7.2  ALBUMIN 3.8 3.6 3.9    Urine analysis:    Component Value Date/Time   COLORURINE YELLOW 02/24/2021 [place] 02/24/2021 0821   LABSPEC 1.015 02/24/2021 4401  PHURINE 7.5 02/24/2021 0821   GLUCOSEU NEGATIVE 02/24/2021 0821   HGBUR NEGATIVE 02/24/2021 0821   BILIRUBINUR NEGATIVE 02/24/2021 0821   KETONESUR NEGATIVE 02/24/2021 0821   PROTEINUR NEGATIVE 02/24/2021 0821   UROBILINOGEN 0.2 05/13/2014 0658   NITRITE NEGATIVE 02/24/2021 0821   LEUKOCYTESUR NEGATIVE 02/24/2021 0821    Radiological Exams on Admission: DG Chest Port 1  View  Result Date: 02/28/2021 CLINICAL DATA:  Pt c/o SOB w/ exertion and chronic upper abdominal pain. H/o CAD, Stroke, Myocardial Infarction. Former smoker. dyspnea EXAM: PORTABLE CHEST 1 VIEW COMPARISON:  02/25/2021 FINDINGS: Port in the anterior chest wall with tip in distal SVC. Sternotomy wires overlie normal cardiac silhouette. Normal pulmonary vasculature. Small chronic RIGHT effusion. No infiltrate or pneumothorax. No acute osseous abnormality. Anterior cervical fusion IMPRESSION: 1. Small chronic RIGHT effusion. 2. No pneumonia or edema. Electronically Signed   By: Suzy Bouchard M.D.   On: 02/28/2021 19:19    EKG: Independently reviewed.  EKG shows normal sinus rhythm with no acute ST elevation or depression.  QTc 457  Assessment/Plan Principal Problem:   Hypoxia Mr. Chill was recently discharged and came back with continued dyspnea with exertion associated with hypoxia. O2 sats drop into mid 80's with exertion.  Obtain CTA chest to rule out PE as etiology. CTA chest ordered and pending.   Active Problems:   Dyspnea Pt with lung cancer and undergoing chemotherapy.  Albuterol MDI as needed. Borvana neb treatments as needed.  Flutter valve ordered.     Essential hypertension Continue norvasc, metoprolol.  Monitor BP.      Paroxysmal atrial fibrillation On amiodarone which will be continued. Monitor on telemetry.     Chronic diastolic CHF Stable. Continue home meds     CKD 3A Stable.     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HCC)   CAD of autologous artery bypass graft without angina   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[place])   S/P lobectomy of lung Chronic.     DVT prophylaxis: Lovenox for DVT prophylaxis.  Will convert to therapeutic anticoagulation if PE is identified Code Status:   Full code Family Communication:  Diagnosis and plan discussed with patient.  Patient verbalized understanding agrees with plan.  Further recommendations to follow as clinically  indicated Disposition Plan:   Patient is from:  Home  Anticipated DC to:  Home  Anticipated DC date:  Anticipate 2 midnight or more stay  Anticipated DC barriers: No barriers to discharge identified at this time  Admission status:  Inpatient   Yevonne Aline Graylyn Bunney MD [facility]  How to contact the [facility] Attending or Consulting provider [place] or covering provider during after hours [place], for this patient?   Check the care team in [facility] and look for a) attending/consulting [facility] provider listed and b) the [facility] team listed Log into [email or web address] and use [place]'s universal password to access. If you do not have the password, please contact the hospital operator. Locate the [facility] provider you are looking for under [facility] and page to a number that you can be directly reached. If you still have difficulty reaching the provider, please page the [facility] (Director on Call) for the Hospitalists listed on amion for assistance.  02/28/2021, 10:52 PM

## 2021-03-01 DIAGNOSIS — I25810 Atherosclerosis of coronary artery bypass graft(s) without angina pectoris: Secondary | ICD-10-CM

## 2021-03-01 DIAGNOSIS — R0902 Hypoxemia: Principal | ICD-10-CM

## 2021-03-01 DIAGNOSIS — I10 Essential (primary) hypertension: Secondary | ICD-10-CM

## 2021-03-01 LAB — CULTURE, BLOOD (SINGLE)
Culture: NO GROWTH
Culture: NO GROWTH
Special Requests: ADEQUATE
Special Requests: ADEQUATE

## 2021-03-01 LAB — RESP PANEL BY RT-PCR (FLU A&B, COVID) ARPGX2
Influenza A by PCR: NEGATIVE
Influenza B by PCR: NEGATIVE
SARS Coronavirus 2 by RT PCR: NEGATIVE

## 2021-03-01 LAB — CBC
HCT: 31.4 % — ABNORMAL LOW (ref 39.0–52.0)
Hemoglobin: 9.9 g/dL — ABNORMAL LOW (ref 13.0–17.0)
MCH: 29.1 pg (ref 26.0–34.0)
MCHC: 31.5 g/dL (ref 30.0–36.0)
MCV: 92.4 fL (ref 80.0–100.0)
Platelets: 286 10*3/uL (ref 150–400)
RBC: 3.4 MIL/uL — ABNORMAL LOW (ref 4.22–5.81)
RDW: 13.1 % (ref 11.5–15.5)
WBC: 14.7 10*3/uL — ABNORMAL HIGH (ref 4.0–10.5)
nRBC: 0 % (ref 0.0–0.2)

## 2021-03-01 MED ORDER — METHOCARBAMOL 500 MG PO TABS: 500.0000 mg | ORAL_TABLET | Freq: Four times a day (QID) | ORAL | Status: DC | PRN

## 2021-03-01 MED ORDER — MOMETASONE FURO-FORMOTEROL FUM 100-5 MCG/ACT IN AERO
2.0000 | INHALATION_SPRAY | Freq: Two times a day (BID) | RESPIRATORY_TRACT | Status: DC
  Administered 2021-03-02: 2 via RESPIRATORY_TRACT
  Filled 2021-03-01: qty 8.8

## 2021-03-01 MED ORDER — CHLORHEXIDINE GLUCONATE CLOTH 2 % EX PADS
6.0000 | MEDICATED_PAD | Freq: Every day | CUTANEOUS | Status: DC
  Administered 2021-03-01 – 2021-03-02 (×2): 6 via TOPICAL

## 2021-03-01 MED ORDER — IPRATROPIUM-ALBUTEROL 0.5-2.5 (3) MG/3ML IN SOLN
3.0000 mL | Freq: Four times a day (QID) | RESPIRATORY_TRACT | Status: DC
  Administered 2021-03-01 – 2021-03-02 (×4): 3 mL via RESPIRATORY_TRACT
  Filled 2021-03-01 (×4): qty 3

## 2021-03-01 MED ORDER — SENNOSIDES-DOCUSATE SODIUM 8.6-50 MG PO TABS
1.0000 | ORAL_TABLET | Freq: Two times a day (BID) | ORAL | Status: DC
  Administered 2021-03-02: 1 via ORAL
  Filled 2021-03-01 (×2): qty 1

## 2021-03-01 MED ORDER — METOPROLOL SUCCINATE ER 25 MG PO TB24
25.0000 mg | ORAL_TABLET | Freq: Every day | ORAL | Status: DC
  Administered 2021-03-02: 25 mg via ORAL
  Filled 2021-03-01: qty 1

## 2021-03-01 MED ORDER — FUROSEMIDE 10 MG/ML IJ SOLN
20.0000 mg | Freq: Once | INTRAMUSCULAR | Status: AC
  Administered 2021-03-01: 20 mg via INTRAVENOUS
  Filled 2021-03-01: qty 2

## 2021-03-01 MED ORDER — BISACODYL 10 MG RE SUPP: 10.0000 mg | Freq: Every day | RECTAL | Status: DC | PRN

## 2021-03-01 MED ORDER — METHYLPREDNISOLONE SODIUM SUCC 40 MG IJ SOLR
40.0000 mg | Freq: Three times a day (TID) | INTRAMUSCULAR | Status: DC
  Administered 2021-03-01 – 2021-03-02 (×3): 40 mg via INTRAVENOUS
  Filled 2021-03-01 (×3): qty 1

## 2021-03-01 NOTE — Progress Notes (Signed)
Patient concerned about his oxygen saturation. Patient was 95% on room air with pulse of 118. We walked down full hallway and back and his oxygen saturation was between 90%-88% with pulse in the mid 130's. Afterwards his oxygen status recovered well with no need for supplemental oxygen.

## 2021-03-01 NOTE — Progress Notes (Signed)
Patient ID: [name], male   DOB: 03/22/1956, 65 y.o.   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 fib not on anticoagulation, low back pain secondary to L3-L4 disc protrusion, recent hospitalization from 02/24/2021-02/27/2021 for dyspnea on exertion/neutropenia treated with broad-spectrum antibiotics and right-sided thoracentesis with removal of 220 cc fluid along with Granix for neutropenia with subsequent improvement of neutropenia and discharged home on 02/27/2021 off antibiotics presented on 02/28/2021 with worsening shortness of breath.  His oxygen saturation was in the 80s on room air at home.  On presentation, oxygen saturation will drop into the mid 80s on ambulation as per the ED provider.  He had leukocytosis, possibly secondary to recent Granix.  Chest x-ray showed a small right pleural effusion.  Assessment & Plan:   Hypoxia -Patient's oxygen saturation was dropping to the 80s on room air on ambulation on presentation. -Currently on room air at rest.  Will request PT eval to ambulate the patient and see if he still is hypoxic with ambulation - Chest x-ray showed a small right pleural effusion.  CTA of the rest was negative for PE with interval decrease in trace right pleural effusion with persistent right lower lobe bronchial debris/mucus plugging with no associated parenchymal or interstitial infection. -Chest physiotherapy.  DC IV fluids. -Unclear if the patient has a component of COPD as well.  We will add Dulera and steroids for today.  Continue current nebulizer and inhaler regimen -May need outpatient pulmonary  evaluation  Right-sided pleural effusion -Patient had recent thoracentesis and removal of 220 cc of pleural fluid during last hospitalization with negative cultures.  Chronically disease stage IIIb -Creatinine stable.  Paroxysmal A. Fib -Currently rate controlled.  Continue amiodarone.  Resume metoprolol.  Not on anticoagulation; outpatient follow-up with cardiology   History of unspecified CVA -Continue aspirin and Plavix  Chronic diastolic heart failure -Currently stable.  Last 2D echo showed EF of 55%.  Outpatient follow-up with cardiology.  Resume metoprolol  Hypertension -Blood pressure stable.  Resume metoprolol.  Continue amlodipine.   Neuroendocrine lung carcinoma with brain metastases -Completed radiation therapy to the brain lesion.  Currently undergoing adjuvant chemotherapy.  Outpatient follow-up with oncology.   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.    DVT prophylaxis: Lovenox Code Status: Full Family Communication: None at bedside Disposition Plan: Status is: Inpatient  Remains inpatient appropriate because:Inpatient level of care appropriate due to severity of illness  Dispo: The patient is from: Home              Anticipated d/c is to: Home              Patient currently is not medically stable to d/c.   Difficult to place patient No  Consultants: None  Procedures: None  Antimicrobials: None   Subjective: Patient seen and examined at bedside.  Feels slightly better but short of breath with exertion.  No overnight fever, vomiting or worsening chest pain reported.  Objective: Vitals:   03/01/21 0411 03/01/21 0726 03/01/21 0759 03/01/21 1000  BP: 109/78  118/68   Pulse: 86  78   Resp: 14  16   Temp: 98.5 F (36.9 C)  98.3 F (36.8 C)   TempSrc: Oral  Oral   SpO2: 95%  94% 94%  Weight:  82.4 kg    Height:  5\' 9"  (1.753 m)      Intake/Output Summary (Last 24 hours) at  03/01/2021 1014 Last data filed at 03/01/2021 0305 Gross per 24 hour  Intake 259.53 ml  Output --  Net 259.53 ml   Filed Weights   03/01/21 0726  Weight: 82.4 kg    Examination:  General exam: Appears calm and comfortable.  Currently on room air. Respiratory system: Bilateral decreased breath sounds at bases with some scattered crackles Cardiovascular system: S1 & S2 heard, Rate controlled Gastrointestinal system: Abdomen is nondistended, soft and nontender. Normal bowel sounds heard. Extremities: No cyanosis, clubbing, edema  Central nervous system: Alert and oriented. No focal neurological deficits. Moving extremities Skin: No rashes, lesions or ulcers Psychiatry: Judgement and insight appear normal. Mood & affect appropriate.     Data Reviewed: I have personally reviewed following labs and imaging studies  CBC: Recent Labs  Lab 02/24/21 0821 02/24/21 1759 02/25/21 0514 02/26/21 1041 02/27/21 0448 02/28/21 1840 03/01/21 0521  WBC 1.5*   < > 2.0* 2.2* 5.8 15.6* 14.7*  NEUTROABS 0.1*  --   --  0.2* 3.1 10.8*  --   HGB 11.1*   < > 10.4* 11.8* 10.5* 11.3* 9.9*  HCT 33.7*   < > 33.6* 37.0* 33.2* 35.0* 31.4*  MCV 90.3   < > 92.3 93.2 93.8 91.9 92.4  PLT 207   < > 193 277 240 319 286   < > = values in this interval not displayed.   Basic Metabolic Panel: Recent Labs  Lab 02/24/21 0821 02/24/21 1759 02/25/21 0514 02/26/21 0519 02/27/21 0448 02/28/21 1840  NA 137  --  137 136 137 137  K 4.6  --  4.0 4.0 4.3 3.9  CL 103  --  101 102 105 103  CO2 26  --  28 27 25 24   GLUCOSE 110*  --  106* 111* 113* 106*  BUN 20  --  20 20 16 17   CREATININE 1.56* 1.54* 1.93* 1.60* 1.50* 1.60*  CALCIUM 9.3  --  9.2 8.7* 8.8* 9.4   GFR: Estimated Creatinine Clearance: 46 mL/min (A) (by C-G formula based on SCr of 1.6 mg/dL (H)). Liver Function Tests: Recent Labs  Lab 02/24/21 0821 02/25/21 0514 02/28/21 1840  AST 19 17 21   ALT 21 20 19   ALKPHOS 69 67 79  BILITOT 0.4 0.4 0.5   PROT 6.8 6.4* 7.2  ALBUMIN 3.8 3.6 3.9   No results for input(s): LIPASE, AMYLASE in the last 168 hours. No results for input(s): AMMONIA in the last 168 hours. Coagulation Profile: Recent Labs  Lab 02/24/21 0917 02/28/21 1840  INR 1.1 1.0   Cardiac Enzymes: No results for input(s): CKTOTAL, CKMB, CKMBINDEX, TROPONINI in the last 168 hours. BNP (last 3 results) No results for input(s): PROBNP in the last 8760 hours. HbA1C: No results for input(s): HGBA1C in the last 72 hours. CBG: No results for input(s): GLUCAP in the last 168 hours. Lipid Profile: No results for input(s): CHOL, HDL, LDLCALC, TRIG, CHOLHDL, LDLDIRECT in the last 72 hours. Thyroid Function Tests: No results for input(s): TSH, T4TOTAL, FREET4, T3FREE, THYROIDAB in the last 72 hours. Anemia Panel: No results for input(s): VITAMINB12, FOLATE, FERRITIN, TIBC, IRON, RETICCTPCT in the last 72 hours. Sepsis Labs: Recent Labs  Lab 02/24/21  0821  LATICACIDVEN 1.4    Recent Results (from the past 240 hour(s))  Urine culture     Status: None   Collection Time: 02/24/21  8:21 AM   Specimen: In/Out Cath Urine  Result Value Ref Range Status   Specimen Description   Final    IN/OUT CATH URINE Performed at [facility], [address] [place], [place] [address]    Special Requests   Final    NONE Performed at [facility], [address] [place], [place] [address]    Culture   Final    NO GROWTH Performed at [facility], [place] [address], [place], [place] [address]    Report Status 02/26/2021 FINAL  Final  Blood culture (routine single)     Status: None   Collection Time: 02/24/21  8:35 AM   Specimen: BLOOD RIGHT ARM  Result Value Ref Range Status   Specimen Description   Final    BLOOD RIGHT ARM Performed at [facility], [address] [place], [place] [address]    Special Requests   Final    BOTTLES DRAWN AEROBIC AND ANAEROBIC Blood Culture adequate  volume Performed at [facility], [address] [place], [place] [address]    Culture   Final    NO GROWTH 5 DAYS Performed at [facility], [place] [address], [place], [place] [address]    Report Status 03/01/2021 FINAL  Final  Resp Panel by RT-PCR (Flu A&B, Covid) Nasopharyngeal Swab     Status: None   Collection Time: 02/24/21  8:53 AM   Specimen: Nasopharyngeal Swab; Nasopharyngeal(NP) swabs in vial transport medium  Result Value Ref Range Status   SARS Coronavirus 2 by RT PCR NEGATIVE NEGATIVE Final    Comment: (NOTE) SARS-CoV-2 target nucleic acids are NOT DETECTED.  The SARS-CoV-2 RNA is generally detectable in upper respiratory specimens during the acute phase of infection. The lowest concentration of SARS-CoV-2 viral copies this assay can detect is 138 copies/mL. A negative result does not preclude SARS-Cov-2 infection and should not be used as the sole basis for treatment or other patient management decisions. A negative result may occur with  improper specimen collection/handling, submission of specimen other than nasopharyngeal swab, presence of viral mutation(s) within the areas targeted by this assay, and inadequate number of viral copies(<138 copies/mL). A negative result must be combined with clinical observations, patient history, and epidemiological information. The expected result is Negative.  Fact Sheet for Patients:  EntrepreneurPulse.com.au  Fact Sheet for Healthcare Providers:  IncredibleEmployment.be  This test is no t yet approved or cleared by the [place] FDA and  has been authorized for detection and/or diagnosis of SARS-CoV-2 by FDA under an Emergency Use Authorization (EUA). This EUA will remain  in effect (meaning this test can be used) for the duration of the COVID-19 declaration under Section 564(b)(1) of the Act, 21 U.S.C.section 360bbb-3(b)(1), unless the authorization is terminated   or revoked sooner.       Influenza A by PCR NEGATIVE NEGATIVE Final   Influenza B by PCR NEGATIVE NEGATIVE Final    Comment: (NOTE) The Xpert Xpress SARS-CoV-2/FLU/RSV plus assay is intended as an aid in the diagnosis of influenza from Nasopharyngeal swab specimens and should not be used as a sole basis for treatment. Nasal washings and aspirates are unacceptable for Xpert Xpress SARS-CoV-2/FLU/RSV testing.  Fact Sheet for Patients: EntrepreneurPulse.com.au  Fact Sheet for Healthcare  Providers: IncredibleEmployment.be  This test is not yet approved or cleared by the [place] and has been authorized for detection and/or diagnosis of SARS-CoV-2 by FDA under an Emergency Use Authorization (EUA). This EUA will remain in effect (meaning this test can be used) for the duration of the COVID-19 declaration under Section 564(b)(1) of the Act, 21 U.S.C. section 360bbb-3(b)(1), unless the authorization is terminated or revoked.  Performed at [facility], [address] [place], [place] [address]   Culture, blood (single)     Status: None   Collection Time: 02/24/21  1:17 PM   Specimen: Left Antecubital; Blood  Result Value Ref Range Status   Specimen Description   Final    LEFT ANTECUBITAL Performed at [facility], [address] [place], [place] [address]    Special Requests   Final    BOTTLES DRAWN AEROBIC AND ANAEROBIC Blood Culture adequate volume Performed at [facility], [address] [place], [place] [address]    Culture   Final    NO GROWTH 5 DAYS Performed at [facility], [place] [address] [place], [place] [address]    Report Status 03/01/2021 FINAL  Final  Gram stain     Status: None   Collection Time: 02/25/21  3:07 PM   Specimen: Lung, Right; Pleural Fluid  Result Value Ref Range Status   Specimen Description   Final    PLEURAL RIGHT Performed at [place] [address], [place], [place] [address]    Special Requests   Final    NONE Performed at [facility], [place] [address] [place], [place] [address]    Gram Stain   Final    RARE WBC PRESENT, PREDOMINANTLY MONONUCLEAR NO ORGANISMS SEEN Performed at [facility], [place] [address] [place], [place] [address]    Report Status 02/26/2021 FINAL  Final  Culture, blood (routine x 2)     Status: None (Preliminary result)   Collection Time: 02/28/21  6:40 PM   Specimen: BLOOD  Result Value Ref Range Status   Specimen Description   Final    BLOOD RIGHT ANTECUBITAL Performed at [place] [address], [place], [place] [address]    Special Requests   Final    BOTTLES DRAWN AEROBIC AND ANAEROBIC Blood Culture adequate volume Performed at [place] [address] [place], [place] [address]    Culture   Final    NO GROWTH < 12 HOURS Performed at [place] [address], [place], [place] [address]    Report Status PENDING  Incomplete  Resp Panel by RT-PCR (Flu A&B, Covid) Nasopharyngeal Swab     Status: None   Collection Time: 02/28/21 11:02 PM   Specimen: Nasopharyngeal Swab; Nasopharyngeal(NP) swabs in vial transport medium  Result Value Ref Range Status   SARS Coronavirus 2 by RT PCR NEGATIVE NEGATIVE Final    Comment: (NOTE) SARS-CoV-2 target nucleic acids are NOT DETECTED.  The SARS-CoV-2 RNA is generally detectable in upper respiratory specimens during the acute phase of infection. The lowest concentration of SARS-CoV-2 viral copies this assay can detect is 138 copies/mL. A negative result does not preclude SARS-Cov-2 infection and should not be used as the sole basis for treatment or other patient management decisions. A negative result may occur with  improper specimen collection/handling, submission of specimen other than nasopharyngeal swab, presence of viral mutation(s)  within  the areas targeted by this assay, and inadequate number of viral copies(<138 copies/mL). A negative result must be combined with clinical observations, patient history, and epidemiological information. The expected result is Negative.  Fact Sheet for Patients:  EntrepreneurPulse.com.au  Fact Sheet for Healthcare Providers:  IncredibleEmployment.be  This test is no t yet approved or cleared by the [place] FDA and  has been authorized for detection and/or diagnosis of SARS-CoV-2 by FDA under an Emergency Use Authorization (EUA). This EUA will remain  in effect (meaning this test can be used) for the duration of the COVID-19 declaration under Section 564(b)(1) of the Act, 21 U.S.C.section 360bbb-3(b)(1), unless the authorization is terminated  or revoked sooner.       Influenza A by PCR NEGATIVE NEGATIVE Final   Influenza B by PCR NEGATIVE NEGATIVE Final    Comment: (NOTE) The Xpert Xpress SARS-CoV-2/FLU/RSV plus assay is intended as an aid in the diagnosis of influenza from Nasopharyngeal swab specimens and should not be used as a sole basis for treatment. Nasal washings and aspirates are unacceptable for Xpert Xpress SARS-CoV-2/FLU/RSV testing.  Fact Sheet for Patients: EntrepreneurPulse.com.au  Fact Sheet for Healthcare Providers: IncredibleEmployment.be  This test is not yet approved or cleared by the [place] FDA and has been authorized for detection and/or diagnosis of SARS-CoV-2 by FDA under an Emergency Use Authorization (EUA). This EUA will remain in effect (meaning this test can be used) for the duration of the COVID-19 declaration under Section 564(b)(1) of the Act, 21 U.S.C. section 360bbb-3(b)(1), unless the authorization is terminated or revoked.  Performed at [facility], [place] [address], [place], [place] [address]          Radiology Studies: CT Angio  Chest PE W and/or Wo Contrast  Result Date: 02/28/2021 CLINICAL DATA:  Neuroendocrine lung carcinoma with brain metastases, currently on chemo Per EMS, Pt, from home, c/o SOB w/ exertion and chronic upper abdominal pain. Pt was discharged yesterday. EXAM: CT ANGIOGRAPHY CHEST WITH CONTRAST TECHNIQUE: Multidetector CT imaging of the chest was performed using the standard protocol during bolus administration of intravenous contrast. Multiplanar CT image reconstructions and MIPs were obtained to evaluate the vascular anatomy. CONTRAST:  27mL OMNIPAQUE IOHEXOL 350 MG/ML SOLN COMPARISON:  CT angiography chest 02/24/2021, 02/14/2021 FINDINGS: Cardiovascular: Fairly satisfactory opacification of the pulmonary arteries to the segmental level. Limited evaluation of the subsegmental level due to motion artifact. No evidence of pulmonary embolism. The main pulmonary artery is normal in caliber. Normal heart size. No significant pericardial effusion. The thoracic aorta is stable in caliber measuring up to 3.8 cm on axial imaging (5:135). At least mild atherosclerotic plaque of the thoracic aorta. Coronary artery calcifications status post coronary artery bypass graft. Mediastinum/Nodes: No enlarged mediastinal, hilar, or axillary lymph nodes. Thyroid gland, trachea, and esophagus demonstrate no significant findings. Lungs/Pleura: Status post right upper lobectomy. Persistent mucous plugging noted within the right lower lobe. Interval decrease in size of a trace right pleural effusion. Passive atelectasis of the right lower lobe. No left pleural effusion. Linear atelectasis of the right middle lobe. No focal consolidation. No pulmonary nodule, no pulmonary mass. Upper Abdomen: No acute abnormality. A couple of splenules are again noted and partially visualized (282, 5:318) Musculoskeletal: No chest wall abnormality. No suspicious lytic or blastic osseous lesions. No acute displaced fracture. Multilevel degenerative changes of  the spine. Anterior surgical hardware of the C7-T1 level. Review of the MIP images confirms the above findings. IMPRESSION: 1. No pulmonary embolus.  2. Interval decrease in trace right pleural effusion in a patient status post right upper lobectomy. 3. Persistent right lower lobe bronchial debris/mucous plugging. No associated parenchymal or interstitial infection. 4.  Aortic Atherosclerosis (ICD10-I70.0). Electronically Signed   By: Iven Finn M.D.   On: 02/28/2021 23:12   DG Chest Port 1 View  Result Date: 02/28/2021 CLINICAL DATA:  Pt c/o SOB w/ exertion and chronic upper abdominal pain. H/o CAD, Stroke, Myocardial Infarction. Former smoker. dyspnea EXAM: PORTABLE CHEST 1 VIEW COMPARISON:  02/25/2021 FINDINGS: Port in the anterior chest wall with tip in distal SVC. Sternotomy wires overlie normal cardiac silhouette. Normal pulmonary vasculature. Small chronic RIGHT effusion. No infiltrate or pneumothorax. No acute osseous abnormality. Anterior cervical fusion IMPRESSION: 1. Small chronic RIGHT effusion. 2. No pneumonia or edema. Electronically Signed   By: Suzy Bouchard M.D.   On: 02/28/2021 19:19        Scheduled Meds:  amiodarone  200 mg Oral QPM   amLODipine  10 mg Oral Daily   arformoterol  15 mcg Nebulization BID   And   umeclidinium bromide  1 puff Inhalation Daily   aspirin EC  81 mg Oral Daily   Chlorhexidine Gluconate Cloth  6 each Topical Daily   clopidogrel  75 mg Oral Daily   enoxaparin (LOVENOX) injection  40 mg Subcutaneous Q24H   Continuous Infusions:  lactated ringers 100 mL/hr at 03/01/21 0305          Aline August, MD [facility] 03/01/2021, 10:14 AM

## 2021-03-01 NOTE — Progress Notes (Signed)
Chest PT ordered for Patient. Pt shows good technique for using flutter valve. He has cough with flutter. I have concerns about using chest vest for this patient due to the fact that he has a port that is accessed in his upper right chest.

## 2021-03-02 DIAGNOSIS — C7931 Secondary malignant neoplasm of brain: Secondary | ICD-10-CM

## 2021-03-02 LAB — CBC WITH DIFFERENTIAL/PLATELET
Abs Immature Granulocytes: 1.42 10*3/uL — ABNORMAL HIGH (ref 0.00–0.07)
Basophils Absolute: 0.1 10*3/uL (ref 0.0–0.1)
Basophils Relative: 1 %
Eosinophils Absolute: 0 10*3/uL (ref 0.0–0.5)
Eosinophils Relative: 0 %
HCT: 30.6 % — ABNORMAL LOW (ref 39.0–52.0)
Hemoglobin: 10 g/dL — ABNORMAL LOW (ref 13.0–17.0)
Immature Granulocytes: 9 %
Lymphocytes Relative: 11 %
Lymphs Abs: 1.8 10*3/uL (ref 0.7–4.0)
MCH: 29.7 pg (ref 26.0–34.0)
MCHC: 32.7 g/dL (ref 30.0–36.0)
MCV: 90.8 fL (ref 80.0–100.0)
Monocytes Absolute: 0.5 10*3/uL (ref 0.1–1.0)
Monocytes Relative: 3 %
Neutro Abs: 11.8 10*3/uL — ABNORMAL HIGH (ref 1.7–7.7)
Neutrophils Relative %: 76 %
Platelets: 299 10*3/uL (ref 150–400)
RBC: 3.37 MIL/uL — ABNORMAL LOW (ref 4.22–5.81)
RDW: 12.6 % (ref 11.5–15.5)
WBC: 15.6 10*3/uL — ABNORMAL HIGH (ref 4.0–10.5)
nRBC: 0.1 % (ref 0.0–0.2)

## 2021-03-02 LAB — MAGNESIUM: Magnesium: 1.8 mg/dL (ref 1.7–2.4)

## 2021-03-02 LAB — COMPREHENSIVE METABOLIC PANEL
ALT: 22 U/L (ref 0–44)
AST: 19 U/L (ref 15–41)
Albumin: 3.5 g/dL (ref 3.5–5.0)
Alkaline Phosphatase: 73 U/L (ref 38–126)
Anion gap: 9 (ref 5–15)
BUN: 19 mg/dL (ref 8–23)
CO2: 26 mmol/L (ref 22–32)
Calcium: 8.9 mg/dL (ref 8.9–10.3)
Chloride: 104 mmol/L (ref 98–111)
Creatinine, Ser: 1.38 mg/dL — ABNORMAL HIGH (ref 0.61–1.24)
GFR, Estimated: 57 mL/min — ABNORMAL LOW (ref >60)
Glucose, Bld: 153 mg/dL — ABNORMAL HIGH (ref 70–99)
Potassium: 4 mmol/L (ref 3.5–5.1)
Sodium: 139 mmol/L (ref 135–145)
Total Bilirubin: 0.3 mg/dL (ref 0.3–1.2)
Total Protein: 6.6 g/dL (ref 6.5–8.1)

## 2021-03-02 MED ORDER — ADULT MULTIVITAMIN W/MINERALS CH: 1.0000 | ORAL_TABLET | Freq: Every day | ORAL | Status: DC

## 2021-03-02 MED ORDER — HYDROXYZINE HCL 25 MG PO TABS
50.0000 mg | ORAL_TABLET | Freq: Once | ORAL | Status: AC
  Administered 2021-03-02: 50 mg via ORAL
  Filled 2021-03-02: qty 2

## 2021-03-02 MED ORDER — HEPARIN SOD (PORK) LOCK FLUSH 100 UNIT/ML IV SOLN
500.0000 [IU] | Freq: Once | INTRAVENOUS | Status: AC
  Administered 2021-03-02: 500 [IU]
  Filled 2021-03-02: qty 5

## 2021-03-02 MED ORDER — ENSURE ENLIVE PO LIQD: 237.0000 mL | Freq: Two times a day (BID) | ORAL | Status: DC

## 2021-03-02 MED ORDER — METHOCARBAMOL 500 MG PO TABS: 500.0000 mg | ORAL_TABLET | Freq: Four times a day (QID) | ORAL | 0 refills | Status: DC | PRN

## 2021-03-02 NOTE — Progress Notes (Signed)
Patient complaining of pain despite pain medicine and stating he feel anxious and unable to sleep. Reached out to on call provider. Orders placed and provided to patient.

## 2021-03-02 NOTE — Progress Notes (Addendum)
Initial Nutrition Assessment  DOCUMENTATION CODES:  Not applicable  INTERVENTION:  Add Ensure Enlive po BID, each supplement provides 350 kcal and 20 grams of protein.  Add MVI with minerals daily.  NUTRITION DIAGNOSIS:  Increased nutrient needs related to cancer and cancer related treatments as evidenced by estimated needs.  GOAL:  Patient will meet greater than or equal to 90% of their needs  MONITOR:  PO intake, Supplement acceptance, Labs, Weight trends, I & O's  REASON FOR ASSESSMENT:  Malnutrition Screening Tool    ASSESSMENT:  65 y.o. 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 Presents with hypoxia and dyspnea.  RD working remotely.  Per Epic, pt eating 100% of breakfast and lunch yesterday. Pt with history of good appetite and drinking Ensure per last RD note from previous admission 4 days ago.  Per Epic, pt has lost 15 lbs (7.6%) in the last month, which is significant and severe for the time frame. Pt continuing to lose weight.  Recommend adding Ensure Enlive BID and MVI with minerals.  Medications: reviewed; Solu-Medrol TID, Senokot BID, Percocet PRN (given twice today)  Labs: reviewed; Glucose 153 (H)  NUTRITION - FOCUSED PHYSICAL EXAM: Unable to perform  Diet Order:   Diet Order             Diet - low sodium heart healthy           Diet Heart Room service appropriate? Yes; Fluid consistency: Thin; Fluid restriction: 1500 mL Fluid  Diet effective now                   EDUCATION NEEDS:  No education needs have been identified at this time  Skin:  Skin Assessment: Reviewed RN Assessment  Last BM:  02/28/21  Height:  Ht Readings from Last 1 Encounters:  03/01/21 5\' 9"  (1.753 m)   Weight:  Wt Readings from Last 1 Encounters:  03/01/21 82.4 kg   Ideal Body Weight:  72.7 kg  BMI:  Body mass index is 26.83 kg/m.  Estimated Nutritional Needs:  Kcal:  2500-2700 Protein:  120-135 grams Fluid:  >2.5 L  Derrel Nip, RD, LDN (she/her/hers) Registered Dietitian I After-Hours/Weekend Pager # in [place]

## 2021-03-02 NOTE — Discharge Summary (Signed)
Physician Discharge Summary  OLLIN HOCHMUTH ZHY:[id number] DOB: [birth year] DOA: 02/28/2021  PCP: [name], MD  Admit date: 02/28/2021 Discharge date: 03/02/2021  Admitted From: Home Disposition: Home  Recommendations for Outpatient Follow-up:  Follow up with PCP in 1 week with repeat CBC/BMP Outpatient follow-up with oncology as scheduled tomorrow Recommend outpatient evaluation and follow-up by pulmonary. 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 fib not on anticoagulation, low back pain secondary to L3-L4 disc protrusion, recent hospitalization from 02/24/2021-02/27/2021 for dyspnea on exertion/neutropenia treated with broad-spectrum antibiotics and right-sided thoracentesis with removal of 220 cc fluid along with Granix for neutropenia with subsequent improvement of neutropenia and discharged home on 02/27/2021 off antibiotics presented on 02/28/2021 with worsening shortness of breath.  His oxygen saturation was in the 80s on room air at home.  On presentation, oxygen saturation will drop into the mid 80s on ambulation as per the ED provider.  He had leukocytosis, possibly secondary to recent Granix.  Chest x-ray showed a small right pleural effusion.  Due to hospitalizing, his respiratory status has remained stable and he is currently on room air.  He will be discharged home today with outpatient follow-up with oncology.  Would benefit from outpatient pulmonary evaluation.    Discharge Diagnoses:   Hypoxia -Patient's oxygen saturation was dropping to the 80s on room air  on ambulation on presentation. -Currently on room air at rest.  Will request PT eval to ambulate the patient and see if he still is hypoxic with ambulation - Chest x-ray showed a small right pleural effusion.  CTA of the rest was negative for PE with interval decrease in trace right pleural effusion with persistent right lower lobe bronchial debris/mucus plugging with no associated parenchymal or interstitial infection. -Unclear if the patient has a component of COPD as well.  He was treated with few doses of IV Solu-Medrol.  He does not want to go home on prednisone as he states that he becomes "crazy" with prednisone. -May need outpatient pulmonary evaluation -Currently on room air.  Discharge patient home today.   Right-sided pleural effusion -Patient had recent thoracentesis and removal of 220 cc of pleural fluid during last hospitalization with negative cultures.   Chronically disease stage IIIb -Creatinine stable.   Paroxysmal A. Fib -Currently rate controlled.  Continue amiodarone and metoprolol.  Not on anticoagulation; outpatient follow-up with cardiology   History of unspecified CVA -Continue aspirin and Plavix  Chronic diastolic heart failure -Currently stable.  Last 2D echo showed EF of 55%.  Outpatient follow-up with cardiology.  Resume metoprolol   Hypertension -Blood pressure stable.  Continue metoprolol.  Continue amlodipine.   Neuroendocrine lung carcinoma with brain metastases -Completed radiation therapy to the brain lesion.  Currently undergoing adjuvant chemotherapy.  Outpatient follow-up with oncology.   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.     Discharge Instructions  Allergies as of 03/02/2021       Reactions   Prednisone Other (See Comments)   States that this med makes him "crazy"   Tetanus Toxoids Swelling, Other (See Comments)   Fever, Swelling  of the arm    Wellbutrin [bupropion]  Other (See Comments)   Crazy thoughts, nightmares   Indomethacin Other (See Comments)   rectal bleeding   Other    Other reaction(s): Unknown   Scallops [shellfish Allergy] Nausea Only   Varenicline Other (See Comments)   Dreams Other reaction(s): Unknown Other reaction(s): Unknown        Medication List     TAKE these medications    acetaminophen 500 MG tablet Commonly known as: TYLENOL Take 1,000 mg by mouth every 6 (six) hours as needed for moderate pain.   albuterol 108 (90 Base) MCG/ACT inhaler Commonly known as: VENTOLIN HFA Inhale 2 puffs into the lungs every 6 (six) hours as needed for wheezing or shortness of breath.   amiodarone 200 MG tablet Commonly known as: PACERONE Take 1 tablet (200 mg total) by mouth daily. What changed: when to take this   amLODipine 10 MG tablet Commonly known as: NORVASC TAKE 1 TABLET(10 MG) BY MOUTH DAILY   aspirin EC 81 MG tablet Take 81 mg by mouth daily. Swallow whole.   atorvastatin 80 MG tablet Commonly known as: LIPITOR TAKE 1 TABLET(80 MG) BY MOUTH DAILY   clopidogrel 75 MG tablet Commonly known as: PLAVIX TAKE 1 TABLET BY MOUTH EVERY DAY   fluticasone 50 MCG/ACT nasal spray Commonly known as: FLONASE Place 2 sprays into both nostrils daily as needed for allergies.   lidocaine-prilocaine cream Commonly known as: EMLA Apply 1 application topically once as needed (port access).   Magnesium 100 MG Tabs Take 100 mg by mouth daily.   methocarbamol 500 MG tablet Commonly known as: ROBAXIN Take 1 tablet (500 mg total) by mouth every 6 (six) hours as needed for muscle spasms.   metoprolol succinate 25 MG 24 hr tablet Commonly known as: TOPROL-XL Take 1 tablet (25 mg total) by mouth daily.   multivitamin with minerals Tabs tablet Take 1 tablet by mouth daily. Centrum   nitroGLYCERIN 0.4 MG SL tablet Commonly known as: NITROSTAT PLACE 1 TABLET UNDER THE TONGUE EVERY 5 MINUTES AS NEEDED FOR CHEST PAIN. 3 DOSES  MAX   ondansetron 8 MG tablet Commonly known as: ZOFRAN Take 1 tablet (8 mg total) by mouth every 8 (eight) hours as needed for nausea or vomiting. Starting 3 days after chemotherapy   oxyCODONE-acetaminophen 5-325 MG tablet Commonly known as: PERCOCET/ROXICET Take 1 tablet by mouth every 4 (four) hours as needed for severe pain.   polyethylene glycol 17 g packet Commonly known as: MIRALAX / GLYCOLAX Take 17 g by mouth daily as needed.   prochlorperazine 10 MG tablet Commonly known as: COMPAZINE Take 1 tablet (10 mg total) by mouth every 6 (six) hours as needed.   ranolazine 1000 MG SR tablet Commonly known as: RANEXA Take 1 tablet (1,000 mg total) by mouth 2 (two) times daily. What changed: when to take this   Stiolto Respimat 2.5-2.5 MCG/ACT Aers Generic drug: Tiotropium Bromide-Olodaterol Inhale 2 puffs into the lungs daily.          Follow-up Information     [name], MD. Schedule an appointment as soon as possible for a visit in 1 week(s).   Specialty: Family Medicine Contact information: [address] [place] [address] [phone]         [name], MD .   Specialties: Cardiology, Radiology, Interventional Cardiology Contact information: [address] [place] [address] [phone]  oncology Follow up.   Why: at earliest convenience               Allergies  Allergen Reactions   Prednisone Other (See Comments)    States that this med makes him "crazy"   Tetanus Toxoids Swelling and Other (See Comments)    Fever, Swelling of the arm    Wellbutrin [Bupropion] Other (See Comments)    Crazy thoughts, nightmares   Indomethacin Other (See Comments)    rectal bleeding   Other     Other reaction(s): Unknown   Scallops [Shellfish Allergy] Nausea Only   Varenicline Other (See Comments)    Dreams Other reaction(s): Unknown Other reaction(s): Unknown     Consultations: None   Procedures/Studies: DG Chest 1 View  Result Date: 02/25/2021 CLINICAL DATA:  Post thoracentesis EXAM: CHEST  1 VIEW COMPARISON:  02/24/2021 radiograph and chest CT FINDINGS: Post sternotomy changes. Right-sided central venous catheter tip over the proximal right atrium. Trace right-sided pleural effusion. No visible pneumothorax. Stable cardiomediastinal silhouette with aortic atherosclerosis. Surgical hardware at the thoracic inlet region. IMPRESSION: 1. Trace right pleural effusion without visible pneumothorax post thoracentesis. 2. No significant changes. Electronically Signed   By: Donavan Foil M.D.   On: 02/25/2021 15:27   DG Abd 1 View  Result Date: 02/10/2021 CLINICAL DATA:  Right flank pain EXAM: ABDOMEN - 1 VIEW COMPARISON:  12/20/2018 FINDINGS: The bowel gas pattern is normal. No radio-opaque calculi or other significant radiographic abnormality are seen. Small right pleural effusion. Partially imaged port catheter at the cavoatrial junction. IMPRESSION: No urinary tract calculi identified. Electronically Signed   By: Macy Mis M.D.   On: 02/10/2021 14:02   CT Angio Chest PE W and/or Wo Contrast  Result Date: 02/28/2021 CLINICAL DATA:  Neuroendocrine lung carcinoma with brain metastases, currently on chemo Per EMS, Pt, from home, c/o SOB w/ exertion and chronic upper abdominal pain. Pt was discharged yesterday. EXAM: CT ANGIOGRAPHY CHEST WITH CONTRAST TECHNIQUE: Multidetector CT imaging of the chest was performed using the standard protocol during bolus administration of intravenous contrast. Multiplanar CT image reconstructions and MIPs were obtained to evaluate the vascular anatomy. CONTRAST:  10mL OMNIPAQUE IOHEXOL 350 MG/ML SOLN COMPARISON:  CT angiography chest 02/24/2021, 02/14/2021 FINDINGS: Cardiovascular: Fairly satisfactory opacification of the pulmonary arteries to the segmental level. Limited evaluation of the subsegmental level due to motion  artifact. No evidence of pulmonary embolism. The main pulmonary artery is normal in caliber. Normal heart size. No significant pericardial effusion. The thoracic aorta is stable in caliber measuring up to 3.8 cm on axial imaging (5:135). At least mild atherosclerotic plaque of the thoracic aorta. Coronary artery calcifications status post coronary artery bypass graft. Mediastinum/Nodes: No enlarged mediastinal, hilar, or axillary lymph nodes. Thyroid gland, trachea, and esophagus demonstrate no significant findings. Lungs/Pleura: Status post right upper lobectomy. Persistent mucous plugging noted within the right lower lobe. Interval decrease in size of a trace right pleural effusion. Passive atelectasis of the right lower lobe. No left pleural effusion. Linear atelectasis of the right middle lobe. No focal consolidation. No pulmonary nodule, no pulmonary mass. Upper Abdomen: No acute abnormality. A couple of splenules are again noted and partially visualized (282, 5:318) Musculoskeletal: No chest wall abnormality. No suspicious lytic or blastic osseous lesions. No acute displaced fracture. Multilevel degenerative changes of the spine. Anterior surgical hardware of the C7-T1 level. Review of the MIP images confirms the above findings. IMPRESSION: 1. No pulmonary embolus. 2. Interval decrease in trace right pleural  effusion in a patient status post right upper lobectomy. 3. Persistent right lower lobe bronchial debris/mucous plugging. No associated parenchymal or interstitial infection. 4.  Aortic Atherosclerosis (ICD10-I70.0). Electronically Signed   By: Iven Finn M.D.   On: 02/28/2021 23:12   CT Angio Chest PE W and/or Wo Contrast  Result Date: 02/24/2021 CLINICAL DATA:  Lung cancer, chemotherapy, shortness of breath EXAM: CT ANGIOGRAPHY CHEST WITH CONTRAST TECHNIQUE: Multidetector CT imaging of the chest was performed using the standard protocol during bolus administration of intravenous contrast.  Multiplanar CT image reconstructions and MIPs were obtained to evaluate the vascular anatomy. CONTRAST:  193mL OMNIPAQUE IOHEXOL 350 MG/ML SOLN COMPARISON:  02/14/2021 FINDINGS: Cardiovascular: Satisfactory opacification of the pulmonary arteries to the segmental level. No evidence of pulmonary embolism. Normal heart size. No pericardial effusion. Coronary artery calcification. Post CABG. Thoracic aorta is stable in caliber measuring dilated at 4 cm along the ascending portion. Mediastinum/Nodes: No new enlarged lymph nodes. Lungs/Pleura: Increased, small to moderate right pleural effusion with adjacent atelectasis. Post right upper lobectomy. Upper Abdomen: No acute abnormality. Musculoskeletal: No acute osseous abnormality. Review of the MIP images confirms the above findings. IMPRESSION: No acute pulmonary embolism. Increased, small to moderate right pleural effusion. Electronically Signed   By: Macy Mis M.D.   On: 02/24/2021 10:12   CT Angio Chest PE W and/or Wo Contrast  Result Date: 02/14/2021 CLINICAL DATA:  Recent surgery. Cancer. Right flank pain. Tachycardia. Pulmonary embolus suspected with high probability. Recent diagnosis of UTI without improvement. History of lung cancer metastatic to brain. EXAM: CT ANGIOGRAPHY CHEST CT ABDOMEN AND PELVIS WITH CONTRAST TECHNIQUE: Multidetector CT imaging of the chest was performed using the standard protocol during bolus administration of intravenous contrast. Multiplanar CT image reconstructions and MIPs were obtained to evaluate the vascular anatomy. Multidetector CT imaging of the abdomen and pelvis was performed using the standard protocol during bolus administration of intravenous contrast. CONTRAST:  122mL OMNIPAQUE IOHEXOL 350 MG/ML SOLN COMPARISON:  CT chest 12/12/2020.  CT abdomen and pelvis 02/11/2021 FINDINGS: CTA CHEST FINDINGS Cardiovascular: There is good opacification of the central and segmental pulmonary arteries. No focal filling defects.  No evidence of significant pulmonary embolus. Ascending aortic aneurysm measuring 4 cm in diameter, unchanged. No aortic dissection. Great vessel origins are patent. Coronary artery and aortic calcification. Postoperative changes consistent with coronary bypass. Normal heart size. No pericardial effusions. Mediastinum/Nodes: Esophagus is decompressed. No significant lymphadenopathy in the chest. Scattered lymph nodes are not pathologically enlarged. Lungs/Pleura: Small right pleural effusion. Mild patchy areas of airspace disease in the right lung could represent early multifocal pneumonia. Left lung is clear. Previous right upper lung nodule has been resected in the interval. Musculoskeletal: Degenerative changes in the spine. Postoperative changes in the lower cervical spine. Sternotomy wires. Old rib fractures. Review of the MIP images confirms the above findings. CT ABDOMEN and PELVIS FINDINGS Hepatobiliary: Subcentimeter lesion in the dome of the liver likely representing a cavernous hemangioma. Gallbladder and bile ducts are unremarkable. Pancreas: Unremarkable. No pancreatic ductal dilatation or surrounding inflammatory changes. Spleen: Normal in size without focal abnormality. Adrenals/Urinary Tract: No adrenal gland nodules. Cyst in the upper pole left kidney. Nephrograms are symmetrical and otherwise homogeneous. No hydronephrosis or hydroureter. Bladder is unremarkable. Stomach/Bowel: Stomach, small bowel, and colon are not abnormally distended. No wall thickening or inflammatory changes are apparent. Scattered diverticula in the sigmoid colon without evidence of diverticulitis. Appendix is normal. Vascular/Lymphatic: Aortic atherosclerosis. No enlarged abdominal or pelvic lymph nodes. Reproductive: Prostate is  unremarkable. Other: No free air or free fluid in the abdomen. Abdominal wall musculature appears intact. Musculoskeletal: Degenerative changes in the spine. No destructive bone lesions. Review of  the MIP images confirms the above findings. IMPRESSION: 1. No evidence of significant pulmonary embolus. 2. Small right pleural effusion. Patchy infiltrates in the right lung are nonspecific but could indicate early multifocal pneumonia. 3. No acute process demonstrated in the abdomen or pelvis. 4. Aortic atherosclerosis. Electronically Signed   By: Lucienne Capers M.D.   On: 02/14/2021 22:09   MR Brain W Wo Contrast  Result Date: 02/05/2021 CLINICAL DATA:  Follow-up CNS neoplasm EXAM: MRI HEAD WITHOUT AND WITH CONTRAST TECHNIQUE: Multiplanar, multiecho pulse sequences of the brain and surrounding structures were obtained without and with intravenous contrast. CONTRAST:  65mL MULTIHANCE GADOBENATE DIMEGLUMINE 529 MG/ML IV SOLN COMPARISON:  01/27/2021 FINDINGS: BRAIN New Lesions: 3 mm nodule at the high left and parasagittal frontal lobe seen on 12:147 Larger lesions: None. Stable or Smaller lesions: 6 mm mm enhancing lesion located in the lateral left cerebellum and seen on 11:40. Other Brain findings: Mild vasogenic edema around the left cerebellar metastasis. No acute infarct, acute hemorrhage, hydrocephalus, or collection. Vascular: Normal flow voids and vascular enhancements Skull and upper cervical spine: Normal marrow signal Sinuses/Orbits: Negative IMPRESSION: 1. 3 mm newly seen metastasis in the high left frontal lobe. 2. 6 mm unchanged left cerebellar metastasis. Electronically Signed   By: Monte Fantasia M.D.   On: 02/05/2021 06:09   MR THORACIC SPINE WO CONTRAST  Result Date: 02/19/2021 CLINICAL DATA:  Initial evaluation for intervertebral disc disorder, ongoing back pain. EXAM: MRI THORACIC SPINE WITHOUT CONTRAST TECHNIQUE: Multiplanar, multisequence MR imaging of the thoracic spine was performed. No intravenous contrast was administered. COMPARISON:  Previous MRI from 09/28/2018. FINDINGS: Alignment: Physiologic with preservation of the normal thoracic kyphosis. No listhesis. Vertebrae:  Vertebral body height well maintained without acute or chronic fracture. Bone marrow signal intensity within normal limits. No discrete or worrisome osseous lesions. No abnormal marrow edema. Susceptibility artifact from prior ACDF noted at C7-T1. Cord:  Normal signal and morphology. Paraspinal and other soft tissues: Paraspinous soft tissues within normal limits. Small to moderate layering left pleural effusion. Visualized visceral structures otherwise unremarkable. Disc levels: T1-2:  Unremarkable. T2-3: Unremarkable. T3-4:  Negative interspace.  Mild facet hypertrophy.  No stenosis. T4-5: Negative interspace. Mild right-sided facet hypertrophy. No stenosis. T5-6:  Negative interspace.  Mild facet hypertrophy.  No stenosis. T6-7:  Unremarkable. T7-8:  Unremarkable. T8-9: Right paracentral to subarticular disc protrusion indents the right ventral thecal sac (series 21, image 26). Mild flattening of the right ventral cord without cord signal changes or significant spinal stenosis. Superimposed right-sided reactive endplate spurring. Foramina remain patent. T9-10: Unremarkable. T10-11:  Unremarkable. T11-12: Minimal disc bulge. Mild facet hypertrophy. No canal or foraminal stenosis. T12-L1:  Unremarkable. IMPRESSION: 1. Right paracentral disc protrusion at T8-9 with secondary mild flattening of the right ventral cord, but no cord signal changes or significant stenosis. 2. Mild multilevel facet hypertrophy throughout the thoracic spine as detailed above. Findings could contribute to back pain. 3. Small to moderate layering left pleural effusion. Electronically Signed   By: Jeannine Boga M.D.   On: 02/19/2021 01:54   MR LUMBAR SPINE WO CONTRAST  Result Date: 02/19/2021 CLINICAL DATA:  Initial evaluation for ongoing lower back pain. EXAM: MRI LUMBAR SPINE WITHOUT CONTRAST TECHNIQUE: Multiplanar, multisequence MR imaging of the lumbar spine was performed. No intravenous contrast was administered. COMPARISON:   Prior  radiograph from 09/10/2014. FINDINGS: Segmentation: Standard. Lowest well-formed disc space labeled the L5-S1 level. Alignment: Trace retrolisthesis of L2 on L3. Alignment otherwise normal with preservation of the normal lumbar lordosis. Vertebrae: Vertebral body height well maintained without acute or chronic fracture. Bone marrow signal intensity within normal limits. 1.5 cm benign hemangioma noted within the L1 vertebral body. No other discrete or worrisome osseous lesions. No abnormal marrow edema. Conus medullaris and cauda equina: Conus extends to the L1 level. Conus and cauda equina appear normal. Paraspinal and other soft tissues: Paraspinous soft tissues within normal limits. 2.4 cm T2 hyperintense simple cyst noted within the interpolar left kidney. Visualized visceral structures otherwise unremarkable. Disc levels: L1-2: Degenerative intervertebral disc space narrowing with disc desiccation and diffuse disc bulge. Associated mild reactive endplate change. Minimal facet spurring. No significant spinal stenosis. Foramina remain patent. L2-3: Trace retrolisthesis. Degenerative intervertebral disc space narrowing with diffuse disc bulge and disc desiccation. Mild facet and ligament flavum hypertrophy. No significant spinal stenosis. Foramina remain patent. L3-4: Degenerative intervertebral disc space narrowing with disc desiccation and diffuse disc bulge. Superimposed broad-based left foraminal to extraforaminal disc protrusion contacts the exiting left L4 nerve root as it courses of the left neural foramen (series 8, image 22). Mild bilateral facet hypertrophy. No significant spinal stenosis. Mild left L3 foraminal narrowing. Right neural foramen remains patent. L4-5: Negative interspace. Mild to moderate bilateral facet hypertrophy. Borderline mild narrowing of the lateral recesses bilaterally. Central canal remains patent. Mild bilateral L4 foraminal stenosis. No frank impingement. L5-S1: Disc  desiccation with mild disc bulge, slightly eccentric to the right. Associated right paracentral annular fissure at the level of the descending right S1 nerve root sheath. Mild bilateral facet hypertrophy. No canal or lateral recess stenosis. Foramina remain patent. No impingement. IMPRESSION: 1. Broad-based left foraminal to extraforaminal disc protrusion at L3-4, contacting and potentially irritating the exiting left L4 nerve root. 2. Right eccentric disc bulge with annular fissure at L5-S1, closely approximating and potentially irritating the descending right S1 nerve root. 3. Additional mild for age degenerative disc disease and facet hypertrophy elsewhere within the lumbar spine as above. No other significant stenosis or neural impingement. Electronically Signed   By: Jeannine Boga M.D.   On: 02/19/2021 02:02   CT ABDOMEN PELVIS W CONTRAST  Result Date: 02/14/2021 CLINICAL DATA:  Recent surgery. Cancer. Right flank pain. Tachycardia. Pulmonary embolus suspected with high probability. Recent diagnosis of UTI without improvement. History of lung cancer metastatic to brain. EXAM: CT ANGIOGRAPHY CHEST CT ABDOMEN AND PELVIS WITH CONTRAST TECHNIQUE: Multidetector CT imaging of the chest was performed using the standard protocol during bolus administration of intravenous contrast. Multiplanar CT image reconstructions and MIPs were obtained to evaluate the vascular anatomy. Multidetector CT imaging of the abdomen and pelvis was performed using the standard protocol during bolus administration of intravenous contrast. CONTRAST:  116mL OMNIPAQUE IOHEXOL 350 MG/ML SOLN COMPARISON:  CT chest 12/12/2020.  CT abdomen and pelvis 02/11/2021 FINDINGS: CTA CHEST FINDINGS Cardiovascular: There is good opacification of the central and segmental pulmonary arteries. No focal filling defects. No evidence of significant pulmonary embolus. Ascending aortic aneurysm measuring 4 cm in diameter, unchanged. No aortic dissection.  Great vessel origins are patent. Coronary artery and aortic calcification. Postoperative changes consistent with coronary bypass. Normal heart size. No pericardial effusions. Mediastinum/Nodes: Esophagus is decompressed. No significant lymphadenopathy in the chest. Scattered lymph nodes are not pathologically enlarged. Lungs/Pleura: Small right pleural effusion. Mild patchy areas of airspace disease in the right lung could  represent early multifocal pneumonia. Left lung is clear. Previous right upper lung nodule has been resected in the interval. Musculoskeletal: Degenerative changes in the spine. Postoperative changes in the lower cervical spine. Sternotomy wires. Old rib fractures. Review of the MIP images confirms the above findings. CT ABDOMEN and PELVIS FINDINGS Hepatobiliary: Subcentimeter lesion in the dome of the liver likely representing a cavernous hemangioma. Gallbladder and bile ducts are unremarkable. Pancreas: Unremarkable. No pancreatic ductal dilatation or surrounding inflammatory changes. Spleen: Normal in size without focal abnormality. Adrenals/Urinary Tract: No adrenal gland nodules. Cyst in the upper pole left kidney. Nephrograms are symmetrical and otherwise homogeneous. No hydronephrosis or hydroureter. Bladder is unremarkable. Stomach/Bowel: Stomach, small bowel, and colon are not abnormally distended. No wall thickening or inflammatory changes are apparent. Scattered diverticula in the sigmoid colon without evidence of diverticulitis. Appendix is normal. Vascular/Lymphatic: Aortic atherosclerosis. No enlarged abdominal or pelvic lymph nodes. Reproductive: Prostate is unremarkable. Other: No free air or free fluid in the abdomen. Abdominal wall musculature appears intact. Musculoskeletal: Degenerative changes in the spine. No destructive bone lesions. Review of the MIP images confirms the above findings. IMPRESSION: 1. No evidence of significant pulmonary embolus. 2. Small right pleural  effusion. Patchy infiltrates in the right lung are nonspecific but could indicate early multifocal pneumonia. 3. No acute process demonstrated in the abdomen or pelvis. 4. Aortic atherosclerosis. Electronically Signed   By: Lucienne Capers M.D.   On: 02/14/2021 22:09   CT ABDOMEN PELVIS W CONTRAST  Result Date: 02/11/2021 CLINICAL DATA:  Right flank pain and right upper quadrant tenderness. Kidney stones suspected. Recent surgery for cancer removal. First chemotherapy yesterday. EXAM: CT ABDOMEN AND PELVIS WITH CONTRAST TECHNIQUE: Multidetector CT imaging of the abdomen and pelvis was performed using the standard protocol following bolus administration of intravenous contrast. CONTRAST:  132mL OMNIPAQUE IOHEXOL 300 MG/ML  SOLN COMPARISON:  11/18/2020 FINDINGS: Lower chest: Small right pleural effusion. Hepatobiliary: No focal liver abnormality is seen. No gallstones, gallbladder wall thickening, or biliary dilatation. Pancreas: Unremarkable. No pancreatic ductal dilatation or surrounding inflammatory changes. Spleen: Normal in size without focal abnormality. Adrenals/Urinary Tract: No adrenal gland nodules. Small cyst on the left kidney. Nephrograms are symmetrical. No hydronephrosis or hydroureter. No renal or ureteral stones identified. Bladder is unremarkable. Stomach/Bowel: Stomach, small bowel, and colon are not abnormally distended. No wall thickening or inflammatory changes are appreciated. Appendix is normal. Vascular/Lymphatic: Aortic atherosclerosis. No enlarged abdominal or pelvic lymph nodes. Reproductive: Prostate is unremarkable. Other: No free air or free fluid in the abdomen. Small focal subcutaneous emphysema in the anterior abdominal wall, likely injection site. No infiltration or collection in the subcutaneous fat to suggest postoperative complication. Musculoskeletal: No acute or significant osseous findings. IMPRESSION: 1. No renal or ureteral stone or obstruction. 2. No acute process  demonstrated in the abdomen or pelvis. 3. Aortic atherosclerosis. 4. Small right pleural effusion. Electronically Signed   By: Lucienne Capers M.D.   On: 02/11/2021 01:13   DG Chest Port 1 View  Result Date: 02/28/2021 CLINICAL DATA:  Pt c/o SOB w/ exertion and chronic upper abdominal pain. H/o CAD, Stroke, Myocardial Infarction. Former smoker. dyspnea EXAM: PORTABLE CHEST 1 VIEW COMPARISON:  02/25/2021 FINDINGS: Port in the anterior chest wall with tip in distal SVC. Sternotomy wires overlie normal cardiac silhouette. Normal pulmonary vasculature. Small chronic RIGHT effusion. No infiltrate or pneumothorax. No acute osseous abnormality. Anterior cervical fusion IMPRESSION: 1. Small chronic RIGHT effusion. 2. No pneumonia or edema. Electronically Signed   By: Nicole Kindred  Leonia Reeves M.D.   On: 02/28/2021 19:19   DG Chest Port 1 View  Result Date: 02/24/2021 CLINICAL DATA:  Chest pain and shortness of breath. History of lung cancer. Question sepsis. EXAM: PORTABLE CHEST 1 VIEW COMPARISON:  01/07/2021 FINDINGS: Power port placed from a right internal jugular approach has its tip at the SVC RA junction. Previous median sternotomy and CABG. Bronchial valves present on the right. Previous lobectomy on the right. No sign of active infiltrate or collapse. There may be a small amount of sub pulmonic effusion on the right. IMPRESSION: Bronchial valves on the right. Small amount of sub pulmonic effusion on the right. No pneumonia visible by radiography. Electronically Signed   By: Nelson Chimes M.D.   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 He presents for port catheter placement to establish durable venous access. EXAM: IMPLANTED PORT A CATH PLACEMENT WITH ULTRASOUND AND FLUOROSCOPIC GUIDANCE MEDICATIONS: None. ANESTHESIA/SEDATION: Versed 2 mg IV; Fentanyl 100 mcg IV; Moderate Sedation Time:  23  minutes The patient was continuously monitored during the procedure by the interventional radiology nurse under my direct supervision. FLUOROSCOPY TIME:  0 minutes, 18 seconds (6 mGy) COMPLICATIONS: None immediate. PROCEDURE: The right neck and chest was prepped with chlorhexidine, and draped in the usual sterile fashion using maximum barrier technique (cap and mask, sterile gown, sterile gloves, large sterile sheet, hand hygiene and cutaneous antiseptic). Local anesthesia was attained by infiltration with 1% lidocaine with epinephrine. Ultrasound demonstrated patency of the right internal jugular vein, and this was documented with an image. Under real-time ultrasound guidance, this vein was accessed with a 21 gauge micropuncture needle and image documentation was performed. A small dermatotomy was made at the access site with an 11 scalpel. A 0.018" wire was advanced into the SVC and the access needle exchanged for a 72F micropuncture vascular sheath. The 0.018" wire was then removed and a 0.035" wire advanced into the IVC. An appropriate location for the subcutaneous reservoir was selected below the clavicle and an incision was made through the skin and underlying soft tissues. The subcutaneous tissues were then dissected using a combination of blunt and sharp surgical technique and a pocket was formed. A single lumen power injectable portacatheter was then tunneled through the subcutaneous tissues from the pocket to the dermatotomy and the port reservoir placed within the subcutaneous pocket. The venous access site was then serially dilated and a peel away vascular sheath placed over the wire. The wire was removed and the port catheter advanced into position under fluoroscopic guidance. The catheter tip is positioned in the superior cavoatrial junction. This was documented with a spot image. The portacatheter was then tested and found to flush and aspirate well. The port was flushed with saline followed by 100  units/mL heparinized saline. The pocket was then closed in two layers using first subdermal inverted interrupted absorbable sutures followed by a running subcuticular suture. The epidermis was then sealed with Dermabond. The dermatotomy at the venous access site was also closed with Dermabond. IMPRESSION: Successful placement of a right IJ approach Power Port with ultrasound and fluoroscopic guidance. The catheter is ready for use. Electronically Signed   By: Jacqulynn Cadet M.D.   On: 02/06/2021 14:33   LONG TERM MONITOR-LIVE TELEMETRY (3-14 DAYS)  Result Date: 02/09/2021  Normal sinus rhythm with rare PACs and PVCs.  Brief, string of PACs not associated with symptoms.  No sustained pathologic arrhythmias.  Patch Wear Time:  14 days and 0 hours (2022-05-14T12:06:36-0400 to 2022-05-28T12:06:36-0400) Patient had a min HR of 45 bpm, max HR of 203 bpm, and avg HR of 82 bpm. Predominant underlying rhythm was Sinus Rhythm. 1 run of Supraventricular Tachycardia occurred lasting 6 beats with a max rate of 203 bpm (avg 199 bpm). Isolated SVEs were rare (<1.0%), SVE Couplets were rare (<1.0%), and SVE Triplets were rare (<1.0%). Isolated VEs were rare (<1.0%), and no VE Couplets or VE Triplets were present.   US Abdomen Limited RUQ (LIVER/GB)  Result Date: 02/11/2021 CLINICAL DATA:  Right upper quadrant pain EXAM: ULTRASOUND ABDOMEN LIMITED RIGHT UPPER QUADRANT COMPARISON:  10/24/2020 FINDINGS: Gallbladder: No gallstones or wall thickening visualized. No sonographic Murphy sign noted by sonographer. Common bile duct: Diameter: Normal caliber, 4 mm Liver: No focal lesion identified. Within normal limits in parenchymal echogenicity. Portal vein is patent on color Doppler imaging with normal direction of blood flow towards the liver. Other: None. IMPRESSION: No acute findings. Electronically Signed   By: Rolm Baptise M.D.   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 Request received for diagnostic and therapeutic right thoracentesis. EXAM: ULTRASOUND GUIDED DIAGNOSTIC AND THERAPEUTIC RIGHT THORACENTESIS MEDICATIONS: 1% lidocaine to skin and subcutaneous tissue COMPLICATIONS: None immediate. PROCEDURE: An ultrasound guided thoracentesis was thoroughly discussed with the patient and questions answered. The benefits, risks, alternatives and complications were also discussed. The patient understands and wishes to proceed with the procedure. Written consent was obtained. Ultrasound was performed to localize and mark an adequate pocket of fluid in the right chest. The area was then prepped and draped in the normal sterile fashion. 1% Lidocaine was used for local anesthesia. Under ultrasound guidance a 6 Fr Safe-T-Centesis catheter was introduced. Thoracentesis was performed. The catheter was removed and a dressing applied. FINDINGS: A total of approximately 220 cc of hazy, amber fluid was removed. Samples were sent to the laboratory as requested by the clinical team. IMPRESSION: Successful ultrasound guided diagnostic and therapeutic right thoracentesis yielding 220 cc of pleural fluid. Read by: Rowe Robert, PA-C Electronically Signed   By: Aletta Edouard M.D.   On: 02/25/2021 14:51      Subjective: Patient seen and examined at bedside.  Denies any worsening shortness of breath.  No overnight fever, vomiting or chest pain reported.  Discharge Exam: Vitals:   03/02/21 0833 03/02/21 0836  BP:    Pulse:    Resp:    Temp:    SpO2: 94% 94%    General: Pt is alert, awake, not in acute distress.  Intermittently anxious.  Currently on room air. Cardiovascular: rate controlled, S1/S2 + Respiratory: bilateral decreased breath  sounds at bases with some scattered crackles Abdominal: Soft, NT, ND, bowel sounds + Extremities: no edema, no cyanosis    The results of significant diagnostics from this hospitalization (including imaging, microbiology, ancillary and laboratory) are listed below for reference.     Microbiology: Recent Results (from the past 240 hour(s))  Urine culture     Status: None   Collection Time: 02/24/21  8:21 AM   Specimen: In/Out Cath Urine  Result Value Ref Range Status   Specimen Description   Final  IN/OUT CATH URINE Performed at [facility], [address], [place], [place] [address]    Special Requests   Final    NONE Performed at [facility], [address] [place], [place] [address]    Culture   Final    NO GROWTH Performed at [facility], [place] [address] [place], [place] [address]    Report Status 02/26/2021 FINAL  Final  Blood culture (routine single)     Status: None   Collection Time: 02/24/21  8:35 AM   Specimen: BLOOD RIGHT ARM  Result Value Ref Range Status   Specimen Description   Final    BLOOD RIGHT ARM Performed at [facility], [address] [place], [place] [address]    Special Requests   Final    BOTTLES DRAWN AEROBIC AND ANAEROBIC Blood Culture adequate volume Performed at [facility], [address] [place], [place] [address]    Culture   Final    NO GROWTH 5 DAYS Performed at [facility], [place] [address], [place], [place] [address]    Report Status 03/01/2021 FINAL  Final  Resp Panel by RT-PCR (Flu A&B, Covid) Nasopharyngeal Swab     Status: None   Collection Time: 02/24/21  8:53 AM   Specimen: Nasopharyngeal Swab; Nasopharyngeal(NP) swabs in vial transport medium  Result Value Ref Range Status   SARS Coronavirus 2 by RT PCR NEGATIVE NEGATIVE Final    Comment: (NOTE) SARS-CoV-2 target nucleic acids are NOT DETECTED.  The SARS-CoV-2 RNA is generally detectable in upper  respiratory specimens during the acute phase of infection. The lowest concentration of SARS-CoV-2 viral copies this assay can detect is 138 copies/mL. A negative result does not preclude SARS-Cov-2 infection and should not be used as the sole basis for treatment or other patient management decisions. A negative result may occur with  improper specimen collection/handling, submission of specimen other than nasopharyngeal swab, presence of viral mutation(s) within the areas targeted by this assay, and inadequate number of viral copies(<138 copies/mL). A negative result must be combined with clinical observations, patient history, and epidemiological information. The expected result is Negative.  Fact Sheet for Patients:  EntrepreneurPulse.com.au  Fact Sheet for Healthcare Providers:  IncredibleEmployment.be  This test is no t yet approved or cleared by the [place] FDA and  has been authorized for detection and/or diagnosis of SARS-CoV-2 by FDA under an Emergency Use Authorization (EUA). This EUA will remain  in effect (meaning this test can be used) for the duration of the COVID-19 declaration under Section 564(b)(1) of the Act, 21 U.S.C.section 360bbb-3(b)(1), unless the authorization is terminated  or revoked sooner.       Influenza A by PCR NEGATIVE NEGATIVE Final   Influenza B by PCR NEGATIVE NEGATIVE Final    Comment: (NOTE) The Xpert Xpress SARS-CoV-2/FLU/RSV plus assay is intended as an aid in the diagnosis of influenza from Nasopharyngeal swab specimens and should not be used as a sole basis for treatment. Nasal washings and aspirates are unacceptable for Xpert Xpress SARS-CoV-2/FLU/RSV testing.  Fact Sheet for Patients: EntrepreneurPulse.com.au  Fact Sheet for Healthcare Providers: IncredibleEmployment.be  This test is not yet approved or cleared by the [place] FDA and has been  authorized for detection and/or diagnosis of SARS-CoV-2 by FDA under an Emergency Use Authorization (EUA). This EUA will remain in effect (meaning this test can be used) for the duration of the COVID-19 declaration  under Section 564(b)(1) of the Act, 21 U.S.C. section 360bbb-3(b)(1), unless the authorization is terminated or revoked.  Performed at [facility], [address] [place], [place] [address]   Culture, blood (single)     Status: None   Collection Time: 02/24/21  1:17 PM   Specimen: Left Antecubital; Blood  Result Value Ref Range Status   Specimen Description   Final    LEFT ANTECUBITAL Performed at [facility], [address] [place], [place] [address]    Special Requests   Final    BOTTLES DRAWN AEROBIC AND ANAEROBIC Blood Culture adequate volume Performed at [facility], [address] [place], [place] [address]    Culture   Final    NO GROWTH 5 DAYS Performed at [facility], [place] [address], [place], [place] [address]    Report Status 03/01/2021 FINAL  Final  Gram stain     Status: None   Collection Time: 02/25/21  3:07 PM   Specimen: Lung, Right; Pleural Fluid  Result Value Ref Range Status   Specimen Description   Final    PLEURAL RIGHT Performed at [place] [address], [place], [place] [address]    Special Requests   Final    NONE Performed at [facility], [place] [address], [place], [place] [address]    Gram Stain   Final    RARE WBC PRESENT, PREDOMINANTLY MONONUCLEAR NO ORGANISMS SEEN Performed at [facility], [place] [address] [place], [place] [address]    Report Status 02/26/2021 FINAL  Final  Culture, blood (routine x 2)     Status: None (Preliminary result)   Collection Time: 02/28/21  6:40 PM   Specimen: BLOOD  Result Value Ref Range Status   Specimen Description   Final    BLOOD RIGHT ANTECUBITAL Performed at [place]  [address] [place], [place] [address]    Special Requests   Final    BOTTLES DRAWN AEROBIC AND ANAEROBIC Blood Culture adequate volume Performed at [place] [address] [place], [place] [address]    Culture   Final    NO GROWTH 1 DAY Performed at [facility], [place] [address] [place], [place] [address]    Report Status PENDING  Incomplete  Culture, blood (routine x 2)     Status: None (Preliminary result)   Collection Time: 02/28/21  6:57 PM   Specimen: BLOOD  Result Value Ref Range Status   Specimen Description   Final    BLOOD CHEST RIGHT Performed at [place] [address], [place], [place] [address]    Special Requests   Final    BOTTLES DRAWN AEROBIC AND ANAEROBIC Blood Culture adequate volume Performed at [place] [address], [place], [place] [address]    Culture   Final    NO GROWTH 1 DAY Performed at [facility], [place] [address], [place], [place] [address]    Report Status PENDING  Incomplete  Resp Panel by RT-PCR (Flu A&B, Covid) Nasopharyngeal Swab     Status: None   Collection Time: 02/28/21 11:02 PM   Specimen: Nasopharyngeal Swab; Nasopharyngeal(NP) swabs in vial transport medium  Result Value Ref Range Status   SARS Coronavirus 2 by RT PCR NEGATIVE NEGATIVE Final    Comment: (NOTE) SARS-CoV-2 target nucleic acids are NOT DETECTED.  The SARS-CoV-2 RNA  is generally detectable in upper respiratory specimens during the acute phase of infection. The lowest concentration of SARS-CoV-2 viral copies this assay can detect is 138 copies/mL. A negative result does not preclude SARS-Cov-2 infection and should not be used as the sole basis for treatment or other patient management decisions. A negative result may occur with  improper specimen collection/handling, submission of specimen other than nasopharyngeal swab, presence of viral mutation(s) within the areas targeted by this  assay, and inadequate number of viral copies(<138 copies/mL). A negative result must be combined with clinical observations, patient history, and epidemiological information. The expected result is Negative.  Fact Sheet for Patients:  EntrepreneurPulse.com.au  Fact Sheet for Healthcare Providers:  IncredibleEmployment.be  This test is no t yet approved or cleared by the [place] FDA and  has been authorized for detection and/or diagnosis of SARS-CoV-2 by FDA under an Emergency Use Authorization (EUA). This EUA will remain  in effect (meaning this test can be used) for the duration of the COVID-19 declaration under Section 564(b)(1) of the Act, 21 U.S.C.section 360bbb-3(b)(1), unless the authorization is terminated  or revoked sooner.       Influenza A by PCR NEGATIVE NEGATIVE Final   Influenza B by PCR NEGATIVE NEGATIVE Final    Comment: (NOTE) The Xpert Xpress SARS-CoV-2/FLU/RSV plus assay is intended as an aid in the diagnosis of influenza from Nasopharyngeal swab specimens and should not be used as a sole basis for treatment. Nasal washings and aspirates are unacceptable for Xpert Xpress SARS-CoV-2/FLU/RSV testing.  Fact Sheet for Patients: EntrepreneurPulse.com.au  Fact Sheet for Healthcare Providers: IncredibleEmployment.be  This test is not yet approved or cleared by the [place] FDA and has been authorized for detection and/or diagnosis of SARS-CoV-2 by FDA under an Emergency Use Authorization (EUA). This EUA will remain in effect (meaning this test can be used) for the duration of the COVID-19 declaration under Section 564(b)(1) of the Act, 21 U.S.C. section 360bbb-3(b)(1), unless the authorization is terminated or revoked.  Performed at [facility], [place] [address], [place],  [address]      Labs: BNP (last 3 results) Recent Labs    02/24/21 0821  BNP  78.6   Basic Metabolic Panel: Recent Labs  Lab 02/25/21 0514 02/26/21 0519 02/27/21 0448 02/28/21 1840 03/02/21 0515  NA 137 136 137 137 139  K 4.0 4.0 4.3 3.9 4.0  CL 101 102 105 103 104  CO2 28 27 25 24 26   GLUCOSE 106* 111* 113* 106* 153*  BUN 20 20 16 17 19   CREATININE 1.93* 1.60* 1.50* 1.60* 1.38*  CALCIUM 9.2 8.7* 8.8* 9.4 8.9  MG  --   --   --   --  1.8   Liver Function Tests: Recent Labs  Lab 02/24/21 0821 02/25/21 0514 02/28/21 1840 03/02/21 0515  AST 19 17 21 19   ALT 21 20 19 22   ALKPHOS 69 67 79 73  BILITOT 0.4 0.4 0.5 0.3  PROT 6.8 6.4* 7.2 6.6  ALBUMIN 3.8 3.6 3.9 3.5   No results for input(s): LIPASE, AMYLASE in the last 168 hours. No results for input(s): AMMONIA in the last 168 hours. CBC: Recent Labs  Lab 02/24/21 0821 02/24/21 1759 02/26/21 1041 02/27/21 0448 02/28/21 1840 03/01/21 0521 03/02/21 0515  WBC 1.5*   < > 2.2* 5.8 15.6* 14.7* 15.6*  NEUTROABS 0.1*  --  0.2* 3.1 10.8*  --  11.8*  HGB 11.1*   < > 11.8* 10.5* 11.3* 9.9* 10.0*  HCT 33.7*   < > 37.0* 33.2* 35.0* 31.4* 30.6*  MCV 90.3   < > 93.2 93.8 91.9 92.4 90.8  PLT 207   < > 277 240 319 286 299   < > = values in this interval not displayed.   Cardiac Enzymes: No results for input(s): CKTOTAL, CKMB, CKMBINDEX, TROPONINI in the last 168 hours. BNP: Invalid input(s): POCBNP CBG: No results for input(s): GLUCAP in the last 168 hours. D-Dimer No results for input(s): DDIMER in the last 72 hours. Hgb A1c No results for input(s): HGBA1C in the last 72 hours. Lipid Profile No results for input(s): CHOL, HDL, LDLCALC, TRIG, CHOLHDL, LDLDIRECT in the last 72 hours. Thyroid function studies No results for input(s): TSH, T4TOTAL, T3FREE, THYROIDAB in the last 72 hours.  Invalid input(s): FREET3 Anemia work up No results for input(s): VITAMINB12, FOLATE, FERRITIN, TIBC, IRON, RETICCTPCT in the last 72 hours. Urinalysis    Component Value Date/Time   COLORURINE YELLOW 02/24/2021  0821   APPEARANCEUR CLEAR 02/24/2021 0821   LABSPEC 1.015 02/24/2021 0821   PHURINE 7.5 02/24/2021 0821   GLUCOSEU NEGATIVE 02/24/2021 0821   HGBUR NEGATIVE 02/24/2021 0821   BILIRUBINUR NEGATIVE 02/24/2021 0821   KETONESUR NEGATIVE 02/24/2021 0821   PROTEINUR NEGATIVE 02/24/2021 0821   UROBILINOGEN 0.2 05/13/2014 0658   NITRITE NEGATIVE 02/24/2021 0821   LEUKOCYTESUR NEGATIVE 02/24/2021 9924   Sepsis Labs Invalid input(s): PROCALCITONIN,  WBC,  LACTICIDVEN Microbiology Recent Results (from the past 240 hour(s))  Urine culture     Status: None   Collection Time: 02/24/21  8:21 AM   Specimen: In/Out Cath Urine  Result Value Ref Range Status   Specimen Description   Final    IN/OUT CATH URINE Performed at [facility], [address] [place], [place] [address]    Special Requests   Final    NONE Performed at [facility], [address] [place], [place] [address]    Culture   Final    NO GROWTH Performed at [facility], [place] [address] [place], [place] [address]    Report Status 02/26/2021 FINAL  Final  Blood culture (routine single)     Status: None   Collection Time: 02/24/21  8:35 AM   Specimen: BLOOD RIGHT ARM  Result Value Ref Range Status   Specimen Description   Final    BLOOD RIGHT ARM Performed at [facility], [address] [place], [place] [address]    Special Requests   Final    BOTTLES DRAWN AEROBIC AND ANAEROBIC Blood Culture adequate volume Performed at [facility], [address] [place], [place] [address]    Culture   Final    NO GROWTH 5 DAYS Performed at [facility], [place] [address] [place], [place] [address]    Report Status 03/01/2021 FINAL  Final  Resp Panel by RT-PCR (Flu A&B, Covid) Nasopharyngeal Swab     Status: None   Collection Time: 02/24/21  8:53 AM   Specimen: Nasopharyngeal Swab; Nasopharyngeal(NP) swabs in vial transport medium  Result Value Ref Range Status   SARS  Coronavirus 2 by RT PCR NEGATIVE NEGATIVE Final    Comment: (NOTE) SARS-CoV-2 target nucleic acids are NOT DETECTED.  The SARS-CoV-2 RNA is generally detectable in upper respiratory specimens during the acute phase of infection. The lowest concentration of SARS-CoV-2 viral copies this assay can detect is 138 copies/mL. A negative result does not  preclude SARS-Cov-2 infection and should not be used as the sole basis for treatment or other patient management decisions. A negative result may occur with  improper specimen collection/handling, submission of specimen other than nasopharyngeal swab, presence of viral mutation(s) within the areas targeted by this assay, and inadequate number of viral copies(<138 copies/mL). A negative result must be combined with clinical observations, patient history, and epidemiological information. The expected result is Negative.  Fact Sheet for Patients:  EntrepreneurPulse.com.au  Fact Sheet for Healthcare Providers:  IncredibleEmployment.be  This test is no t yet approved or cleared by the [place] FDA and  has been authorized for detection and/or diagnosis of SARS-CoV-2 by FDA under an Emergency Use Authorization (EUA). This EUA will remain  in effect (meaning this test can be used) for the duration of the COVID-19 declaration under Section 564(b)(1) of the Act, 21 U.S.C.section 360bbb-3(b)(1), unless the authorization is terminated  or revoked sooner.       Influenza A by PCR NEGATIVE NEGATIVE Final   Influenza B by PCR NEGATIVE NEGATIVE Final    Comment: (NOTE) The Xpert Xpress SARS-CoV-2/FLU/RSV plus assay is intended as an aid in the diagnosis of influenza from Nasopharyngeal swab specimens and should not be used as a sole basis for treatment. Nasal washings and aspirates are unacceptable for Xpert Xpress SARS-CoV-2/FLU/RSV testing.  Fact Sheet for  Patients: EntrepreneurPulse.com.au  Fact Sheet for Healthcare Providers: IncredibleEmployment.be  This test is not yet approved or cleared by the [place] FDA and has been authorized for detection and/or diagnosis of SARS-CoV-2 by FDA under an Emergency Use Authorization (EUA). This EUA will remain in effect (meaning this test can be used) for the duration of the COVID-19 declaration under Section 564(b)(1) of the Act, 21 U.S.C. section 360bbb-3(b)(1), unless the authorization is terminated or revoked.  Performed at [facility], [address] [place], [place] [address]   Culture, blood (single)     Status: None   Collection Time: 02/24/21  1:17 PM   Specimen: Left Antecubital; Blood  Result Value Ref Range Status   Specimen Description   Final    LEFT ANTECUBITAL Performed at [facility], [address] [place], [place] [address]    Special Requests   Final    BOTTLES DRAWN AEROBIC AND ANAEROBIC Blood Culture adequate volume Performed at [facility], [address] [place], [place] [address]    Culture   Final    NO GROWTH 5 DAYS Performed at [facility], [place] [address] [place], [place] [address]    Report Status 03/01/2021 FINAL  Final  Gram stain     Status: None   Collection Time: 02/25/21  3:07 PM   Specimen: Lung, Right; Pleural Fluid  Result Value Ref Range Status   Specimen Description   Final    PLEURAL RIGHT Performed at [place] [address] [place], [place] [address]    Special Requests   Final    NONE Performed at [facility], [place] [address] [place], [place] [address]    Gram Stain   Final    RARE WBC PRESENT, PREDOMINANTLY MONONUCLEAR NO ORGANISMS SEEN Performed at [facility], [place] [address], [place], [place] [address]    Report Status 02/26/2021 FINAL  Final  Culture, blood (routine x 2)     Status: None  (Preliminary result)   Collection Time: 02/28/21  6:40 PM   Specimen:  BLOOD  Result Value Ref Range Status   Specimen Description   Final    BLOOD RIGHT ANTECUBITAL Performed at [place] [address], [place], [place] [address]    Special Requests   Final    BOTTLES DRAWN AEROBIC AND ANAEROBIC Blood Culture adequate volume Performed at [place] [address], [place], [place] [address]    Culture   Final    NO GROWTH 1 DAY Performed at [facility], [place] [address], [place], [place] [address]    Report Status PENDING  Incomplete  Culture, blood (routine x 2)     Status: None (Preliminary result)   Collection Time: 02/28/21  6:57 PM   Specimen: BLOOD  Result Value Ref Range Status   Specimen Description   Final    BLOOD CHEST RIGHT Performed at [place] [address], [place], [place] [address]    Special Requests   Final    BOTTLES DRAWN AEROBIC AND ANAEROBIC Blood Culture adequate volume Performed at [place] [address], [place], [place] [address]    Culture   Final    NO GROWTH 1 DAY Performed at [facility], [place] [address] [place], [place] [address]    Report Status PENDING  Incomplete  Resp Panel by RT-PCR (Flu A&B, Covid) Nasopharyngeal Swab     Status: None   Collection Time: 02/28/21 11:02 PM   Specimen: Nasopharyngeal Swab; Nasopharyngeal(NP) swabs in vial transport medium  Result Value Ref Range Status   SARS Coronavirus 2 by RT PCR NEGATIVE NEGATIVE Final    Comment: (NOTE) SARS-CoV-2 target nucleic acids are NOT DETECTED.  The SARS-CoV-2 RNA is generally detectable in upper respiratory specimens during the acute phase of infection. The lowest concentration of SARS-CoV-2 viral copies this assay can detect is 138 copies/mL. A negative result does not preclude SARS-Cov-2 infection and should not be used as the sole basis for treatment or other  patient management decisions. A negative result may occur with  improper specimen collection/handling, submission of specimen other than nasopharyngeal swab, presence of viral mutation(s) within the areas targeted by this assay, and inadequate number of viral copies(<138 copies/mL). A negative result must be combined with clinical observations, patient history, and epidemiological information. The expected result is Negative.  Fact Sheet for Patients:  EntrepreneurPulse.com.au  Fact Sheet for Healthcare Providers:  IncredibleEmployment.be  This test is no t yet approved or cleared by the [place] FDA and  has been authorized for detection and/or diagnosis of SARS-CoV-2 by FDA under an Emergency Use Authorization (EUA). This EUA will remain  in effect (meaning this test can be used) for the duration of the COVID-19 declaration under Section 564(b)(1) of the Act, 21 U.S.C.section 360bbb-3(b)(1), unless the authorization is terminated  or revoked sooner.       Influenza A by PCR NEGATIVE NEGATIVE Final   Influenza B by PCR NEGATIVE NEGATIVE Final    Comment: (NOTE) The Xpert Xpress SARS-CoV-2/FLU/RSV plus assay is intended as an aid in the diagnosis of influenza from Nasopharyngeal swab specimens and should not be used as a sole basis for treatment. Nasal washings and aspirates are unacceptable for Xpert Xpress SARS-CoV-2/FLU/RSV testing.  Fact Sheet for Patients: EntrepreneurPulse.com.au  Fact Sheet for Healthcare Providers: IncredibleEmployment.be  This test is not yet approved or cleared by the [place] FDA and has been authorized for detection and/or diagnosis of SARS-CoV-2 by FDA under an Emergency Use Authorization (  EUA). This EUA will remain in effect (meaning this test can be used) for the duration of the COVID-19 declaration under Section 564(b)(1) of the Act, 21 U.S.C. section  360bbb-3(b)(1), unless the authorization is terminated or revoked.  Performed at [facility], [place] [address], [place], [place] [address]      Time coordinating discharge: 35 minutes  SIGNED:   Aline August, MD  [facility] 03/02/2021, 11:02 AM

## 2021-03-03 ENCOUNTER — Other Ambulatory Visit: Payer: Medicare Other

## 2021-03-03 ENCOUNTER — Telehealth: Payer: Self-pay | Admitting: Medical Oncology

## 2021-03-03 ENCOUNTER — Inpatient Hospital Stay: Payer: Medicare Other | Attending: Physician Assistant | Admitting: Internal Medicine

## 2021-03-03 ENCOUNTER — Inpatient Hospital Stay: Payer: Medicare Other

## 2021-03-03 ENCOUNTER — Encounter: Payer: Self-pay | Admitting: Internal Medicine

## 2021-03-03 ENCOUNTER — Other Ambulatory Visit: Payer: Self-pay | Admitting: Physician Assistant

## 2021-03-03 ENCOUNTER — Other Ambulatory Visit: Payer: Self-pay

## 2021-03-03 VITALS — BP 120/75 | HR 85 | Temp 97.2°F | Resp 19 | Ht 69.0 in | Wt 190.3 lb

## 2021-03-03 DIAGNOSIS — C7A8 Other malignant neuroendocrine tumors: Secondary | ICD-10-CM | POA: Insufficient documentation

## 2021-03-03 DIAGNOSIS — C3491 Malignant neoplasm of unspecified part of right bronchus or lung: Secondary | ICD-10-CM

## 2021-03-03 DIAGNOSIS — I10 Essential (primary) hypertension: Secondary | ICD-10-CM

## 2021-03-03 DIAGNOSIS — C7B8 Other secondary neuroendocrine tumors: Secondary | ICD-10-CM | POA: Diagnosis not present

## 2021-03-03 DIAGNOSIS — D702 Other drug-induced agranulocytosis: Secondary | ICD-10-CM | POA: Diagnosis not present

## 2021-03-03 DIAGNOSIS — R000 Tachycardia, unspecified: Secondary | ICD-10-CM | POA: Insufficient documentation

## 2021-03-03 DIAGNOSIS — Z95828 Presence of other vascular implants and grafts: Secondary | ICD-10-CM

## 2021-03-03 DIAGNOSIS — Z5189 Encounter for other specified aftercare: Secondary | ICD-10-CM | POA: Insufficient documentation

## 2021-03-03 DIAGNOSIS — D701 Agranulocytosis secondary to cancer chemotherapy: Secondary | ICD-10-CM | POA: Insufficient documentation

## 2021-03-03 DIAGNOSIS — R5383 Other fatigue: Secondary | ICD-10-CM | POA: Insufficient documentation

## 2021-03-03 DIAGNOSIS — E860 Dehydration: Secondary | ICD-10-CM | POA: Insufficient documentation

## 2021-03-03 DIAGNOSIS — C7931 Secondary malignant neoplasm of brain: Secondary | ICD-10-CM | POA: Diagnosis not present

## 2021-03-03 DIAGNOSIS — Z5111 Encounter for antineoplastic chemotherapy: Secondary | ICD-10-CM | POA: Diagnosis present

## 2021-03-03 LAB — CBC WITH DIFFERENTIAL (CANCER CENTER ONLY)
Abs Immature Granulocytes: 2.23 10*3/uL — ABNORMAL HIGH (ref 0.00–0.07)
Basophils Absolute: 0.2 10*3/uL — ABNORMAL HIGH (ref 0.0–0.1)
Basophils Relative: 1 %
Eosinophils Absolute: 0 10*3/uL (ref 0.0–0.5)
Eosinophils Relative: 0 %
HCT: 29.6 % — ABNORMAL LOW (ref 39.0–52.0)
Hemoglobin: 10 g/dL — ABNORMAL LOW (ref 13.0–17.0)
Immature Granulocytes: 9 %
Lymphocytes Relative: 15 %
Lymphs Abs: 3.7 10*3/uL (ref 0.7–4.0)
MCH: 29.7 pg (ref 26.0–34.0)
MCHC: 33.8 g/dL (ref 30.0–36.0)
MCV: 87.8 fL (ref 80.0–100.0)
Monocytes Absolute: 2 10*3/uL — ABNORMAL HIGH (ref 0.1–1.0)
Monocytes Relative: 8 %
Neutro Abs: 16.4 10*3/uL — ABNORMAL HIGH (ref 1.7–7.7)
Neutrophils Relative %: 67 %
Platelet Count: 347 10*3/uL (ref 150–400)
RBC: 3.37 MIL/uL — ABNORMAL LOW (ref 4.22–5.81)
RDW: 12.8 % (ref 11.5–15.5)
WBC Count: 24.6 10*3/uL — ABNORMAL HIGH (ref 4.0–10.5)
nRBC: 0 % (ref 0.0–0.2)

## 2021-03-03 LAB — CMP (CANCER CENTER ONLY)
ALT: 23 U/L (ref 0–44)
AST: 17 U/L (ref 15–41)
Albumin: 3.5 g/dL (ref 3.5–5.0)
Alkaline Phosphatase: 83 U/L (ref 38–126)
Anion gap: 11 (ref 5–15)
BUN: 35 mg/dL — ABNORMAL HIGH (ref 8–23)
CO2: 26 mmol/L (ref 22–32)
Calcium: 9.2 mg/dL (ref 8.9–10.3)
Chloride: 106 mmol/L (ref 98–111)
Creatinine: 1.37 mg/dL — ABNORMAL HIGH (ref 0.61–1.24)
GFR, Estimated: 57 mL/min — ABNORMAL LOW (ref >60)
Glucose, Bld: 95 mg/dL (ref 70–99)
Potassium: 3.6 mmol/L (ref 3.5–5.1)
Sodium: 143 mmol/L (ref 135–145)
Total Bilirubin: <0.2 mg/dL — ABNORMAL LOW (ref 0.3–1.2)
Total Protein: 6.9 g/dL (ref 6.5–8.1)

## 2021-03-03 LAB — MAGNESIUM: Magnesium: 1.5 mg/dL — ABNORMAL LOW (ref 1.7–2.4)

## 2021-03-03 MED ORDER — SODIUM CHLORIDE 0.9% FLUSH
10.0000 mL | INTRAVENOUS | Status: AC | PRN
Start: 2021-03-03 — End: 2021-03-03
  Administered 2021-03-03: 10 mL
  Filled 2021-03-03: qty 10

## 2021-03-03 MED ORDER — OXYCODONE-ACETAMINOPHEN 5-325 MG PO TABS: 1.0000 | ORAL_TABLET | ORAL | 0 refills | Status: DC | PRN

## 2021-03-03 MED ORDER — HEPARIN SOD (PORK) LOCK FLUSH 100 UNIT/ML IV SOLN
500.0000 [IU] | Freq: Once | INTRAVENOUS | Status: AC
  Administered 2021-03-03: 500 [IU] via INTRAVENOUS
  Filled 2021-03-03: qty 5

## 2021-03-03 MED ORDER — MAGNESIUM OXIDE -MG SUPPLEMENT 400 (240 MG) MG PO TABS: 400.0000 mg | ORAL_TABLET | Freq: Every day | ORAL | 1 refills | Status: DC

## 2021-03-03 MED ORDER — SODIUM CHLORIDE 0.9% FLUSH
10.0000 mL | Freq: Once | INTRAVENOUS | Status: AC
  Administered 2021-03-03: 10 mL via INTRAVENOUS
  Filled 2021-03-03: qty 10

## 2021-03-03 NOTE — Addendum Note (Signed)
Addended by: Ardeen Garland on: 03/03/2021 09:41 AM   Modules accepted: Orders

## 2021-03-03 NOTE — Progress Notes (Signed)
[place] [phone]   [phone]  OFFICE PROGRESS NOTE  [name], MD [place] [address]  DIAGNOSIS:  Stage IV (pT1c, N1, M1b) large cell neuroendocrine carcinoma.  He presented with a right upper lobe lung nodule and hilar lymphadenopathy.  He was diagnosed in April 2022. He also had a small metastatic brain lesion.    PDL1: 0%   PRIOR THERAPY: Robotic assisted thoracic surgery with a right upper lobectomy which was performed on 12/24/2020.     CURRENT THERAPY: Adjuvant chemotherapy with cisplatin 80 mg/m2 on day 1, etoposide 100 mg per metered squared on days 1, 2, and 3, IV every 3 weeks. First dose on 02/09/21.   INTERVAL HISTORY: [name] 65 y.o. male returns to the clinic today for follow-up visit.  The patient was discharged from the hospital yesterday after being admitted with neutropenic fever.  He was also admitted to the hospital the week before with questionable bacteremia which was felt to be a contaminant.  He continues to have back pain and he was followed by neurosurgery in the past and received steroid injection to that area.  He denied having any current chest pain but has shortness of breath with exertion with no cough or hemoptysis.  He lost few pounds since his last visit.  He denied having any nausea, vomiting, diarrhea or constipation.  He has no headache or visual changes.  He was supposed to start cycle #2 of his adjuvant treatment today. MEDICAL HISTORY: Past Medical History:  Diagnosis Date   Anxiety    Arthritis    Basal cell carcinoma (BCC) of forehead    CAD (coronary artery disease)    a. 10/2015 ant STEMI >> LHC with 3 v CAD; oLAD tx with POBA >> emergent CABG. b. Multiple evals since that time, early graft failure of SVG-RCA by cath 03/2016. c. 2/19 PCI/DES x1 to pRCA, normal EF.   Carotid artery disease ([facility])    a. 40-59% BICA 02/2018.   Depression    Dyspnea    Ectopic  atrial tachycardia (HCC)    Esophageal reflux    eosinophil esophagitis   Family history of adverse reaction to anesthesia    "sister has PONV" (06/21/2017)   Former tobacco use    Gout    Hepatitis C    "treated and cured" (06/21/2017)   High cholesterol    History of kidney stones    Hypertension    Ischemic cardiomyopathy    a. EF 25-30% at intraop TEE 4/17  //  b. Limited Echo 5/17 - EF 45-50%, mild ant HK. c. EF 55-65% by cath 09/2017.   Migraine    "3-4/yr" (06/21/2017)   Myocardial infarction ([facility]) 10/2015   Palpitations    Sinus bradycardia    a. HR dropping into 40s in 02/2016 -> BB reduced.   Stroke [facility]) 10/2016   "small one; sometimes my memory/cognitive issues" (06/21/2017)   Symptomatic hypotension    a. 02/2016 ER visit -> meds reduced.   Syncope    Wears dentures    Wears glasses     ALLERGIES:  is allergic to prednisone, tetanus toxoids, wellbutrin [bupropion], indomethacin, other, scallops [shellfish allergy], and varenicline.  MEDICATIONS:  Current Outpatient Medications  Medication Sig Dispense Refill   acetaminophen (TYLENOL) 500 MG tablet Take 1,000 mg by mouth every 6 (six) hours as needed for moderate pain.     albuterol (VENTOLIN HFA) 108 (  90 Base) MCG/ACT inhaler Inhale 2 puffs into the lungs every 6 (six) hours as needed for wheezing or shortness of breath. 8 g 2   amiodarone (PACERONE) 200 MG tablet Take 1 tablet (200 mg total) by mouth daily. 30 tablet 1   amLODipine (NORVASC) 10 MG tablet TAKE 1 TABLET(10 MG) BY MOUTH DAILY 90 tablet 1   aspirin EC 81 MG tablet Take 81 mg by mouth daily. Swallow whole.     atorvastatin (LIPITOR) 80 MG tablet TAKE 1 TABLET(80 MG) BY MOUTH DAILY 90 tablet 2   clopidogrel (PLAVIX) 75 MG tablet TAKE 1 TABLET BY MOUTH EVERY DAY 90 tablet 1   fluticasone (FLONASE) 50 MCG/ACT nasal spray Place 2 sprays into both nostrils daily as needed for allergies.     lidocaine-prilocaine (EMLA) cream Apply 1 application topically  once as needed (port access).     Magnesium 100 MG TABS Take 100 mg by mouth daily.     methocarbamol (ROBAXIN) 500 MG tablet Take 1 tablet (500 mg total) by mouth every 6 (six) hours as needed for muscle spasms. 30 tablet 0   metoprolol succinate (TOPROL-XL) 25 MG 24 hr tablet Take 1 tablet (25 mg total) by mouth daily. 90 tablet 3   Multiple Vitamin (MULTIVITAMIN WITH MINERALS) TABS tablet Take 1 tablet by mouth daily. Centrum     nitroGLYCERIN (NITROSTAT) 0.4 MG SL tablet PLACE 1 TABLET UNDER THE TONGUE EVERY 5 MINUTES AS NEEDED FOR CHEST PAIN. 3 DOSES MAX 25 tablet 4   ondansetron (ZOFRAN) 8 MG tablet Take 1 tablet (8 mg total) by mouth every 8 (eight) hours as needed for nausea or vomiting. Starting 3 days after chemotherapy 30 tablet 2   oxyCODONE-acetaminophen (PERCOCET/ROXICET) 5-325 MG tablet Take 1 tablet by mouth every 4 (four) hours as needed for severe pain. 15 tablet 0   polyethylene glycol (MIRALAX / GLYCOLAX) 17 g packet Take 17 g by mouth daily as needed. 14 each 0   prochlorperazine (COMPAZINE) 10 MG tablet Take 1 tablet (10 mg total) by mouth every 6 (six) hours as needed. 30 tablet 2   ranolazine (RANEXA) 1000 MG SR tablet Take 1 tablet (1,000 mg total) by mouth 2 (two) times daily. 180 tablet 0   Tiotropium Bromide-Olodaterol (STIOLTO RESPIMAT) 2.5-2.5 MCG/ACT AERS Inhale 2 puffs into the lungs daily. 4 g 3   No current facility-administered medications for this visit.    SURGICAL HISTORY:  Past Surgical History:  Procedure Laterality Date   25 GAUGE PARS PLANA VITRECTOMY WITH 20 GAUGE MVR PORT  12/31/2020   Procedure: 25 GAUGE PARS PLANA VITRECTOMY WITH 20 GAUGE MVR PORT;  Surgeon: [name], MD;  [facility];  Service: Thoracic;;   ANTERIOR CERVICAL DECOMP/DISCECTOMY FUSION N/A 10/17/2018   Procedure: Anterior Cervical Decompression Fusion - Cervical seven -Thoracic one;  Surgeon: Consuella Lose, MD;  Location: [facility];  Service: Neurosurgery;  Laterality:  N/A;   BASAL CELL CARCINOMA EXCISION     "forehead   BIOPSY  07/20/2019   Procedure: BIOPSY;  Surgeon: Carol Ada, MD;  Location: [facility];  Service: Endoscopy;;   BRONCHIAL BIOPSY  12/24/2020   Procedure: BRONCHIAL BIOPSIES;  Surgeon: Garner Nash, DO;  Location: [facility];  Service: Pulmonary;;   BRONCHIAL BRUSHINGS  12/24/2020   Procedure: BRONCHIAL BRUSHINGS;  Surgeon: Garner Nash, DO;  Location: [facility];  Service: Pulmonary;;   BRONCHIAL NEEDLE ASPIRATION BIOPSY  12/24/2020   Procedure: BRONCHIAL NEEDLE ASPIRATION BIOPSIES;  Surgeon: June Leap  L, DO;  Location: [place] [facility];  Service: Pulmonary;;   CARDIAC CATHETERIZATION N/A 11/28/2015   Procedure: Left Heart Cath and Coronary Angiography;  Surgeon: [name], MD;  Location: [place] [facility];  Service: Cardiovascular;  Laterality: N/A;   CARDIAC CATHETERIZATION N/A 11/28/2015   Procedure: Coronary Balloon Angioplasty;  Surgeon: [name], MD;  Location: [place] [facility];  Service: Cardiovascular;  Laterality: N/A;  ostial LAD   CARDIAC CATHETERIZATION N/A 11/28/2015   Procedure: Coronary/Graft Angiography;  Surgeon: [name], MD;  Location: [place] [facility];  Service: Cardiovascular;  Laterality: N/A;  coronaries only    CARDIAC CATHETERIZATION N/A 04/21/2016   Procedure: Left Heart Cath and Coronary Angiography;  Surgeon: Wellington Hampshire, MD;  Location: [place] [facility];  Service: Cardiovascular;  Laterality: N/A;   CARDIAC CATHETERIZATION N/A 06/14/2016   Procedure: Left Heart Cath and Cors/Grafts Angiography;  Surgeon: Lorretta Harp, MD;  Location: [place] [facility] LAB;  Service: Cardiovascular;  Laterality: N/A;   CARDIAC CATHETERIZATION N/A 09/08/2016   Procedure: Left Heart Cath and Cors/Grafts Angiography;  Surgeon: Wellington Hampshire, MD;  Location: [place] CV LAB;  Service: Cardiovascular;  Laterality: N/A;   CARDIAC CATHETERIZATION     CORONARY ARTERY BYPASS  GRAFT N/A 11/28/2015   Procedure: CORONARY ARTERY BYPASS GRAFTING (CABG) TIMES FIVE USING LEFT INTERNAL MAMMARY ARTERY AND RIGHT GREATER SAPHENOUS,VIEN HARVEATED BY ENDOVIEN, INTRAOPPRATIVE TEE;  Surgeon: Gaye Pollack, MD;  Location: [place];  Service: Open Heart Surgery;  Laterality: N/A;   CORONARY STENT INTERVENTION N/A 10/05/2017   Procedure: CORONARY STENT INTERVENTION;  Surgeon: [name], MD;  Location: [place] CV [facility];  Service: Cardiovascular;  Laterality: N/A;   ESOPHAGOGASTRODUODENOSCOPY (EGD) WITH PROPOFOL N/A 07/20/2019   Procedure: ESOPHAGOGASTRODUODENOSCOPY (EGD) WITH PROPOFOL;  Surgeon: Carol Ada, MD;  [facility];  Service: Endoscopy;  Laterality: N/A;   ESOPHAGOGASTRODUODENOSCOPY (EGD) WITH PROPOFOL N/A 01/30/2021   Procedure: ESOPHAGOGASTRODUODENOSCOPY (EGD) WITH PROPOFOL;  Surgeon: Carol Ada, MD;  [facility];  Service: Endoscopy;  Laterality: N/A;   FIDUCIAL MARKER PLACEMENT  12/24/2020   Procedure: FIDUCIAL DYE MARKER PLACEMENT;  Surgeon: Garner Nash, DO;  Location: [facility];  Service: Pulmonary;;   HUMERUS SURGERY Right 1969   "tumor inside bone; filled it w/bone chips"   INTERCOSTAL NERVE BLOCK Right 12/24/2020   Procedure: INTERCOSTAL NERVE BLOCK;  Surgeon: [name], MD;  Location: [facility];  Service: Thoracic;  Laterality: Right;   IR IMAGING GUIDED PORT INSERTION  02/06/2021   LEFT HEART CATH AND CORS/GRAFTS ANGIOGRAPHY N/A 03/11/2017   Procedure: Left Heart Cath and Cors/Grafts Angiography;  Surgeon: Leonie Man, MD;  Location: [place] CV LAB;  Service: Cardiovascular;  Laterality: N/A;   LEFT HEART CATH AND CORS/GRAFTS ANGIOGRAPHY N/A 10/05/2017   Procedure: LEFT HEART CATH AND CORS/GRAFTS ANGIOGRAPHY;  Surgeon: [name], MD;  Location: [place] [facility];  Service: Cardiovascular;  Laterality: N/A;   LEFT HEART CATH AND CORS/GRAFTS ANGIOGRAPHY N/A 04/11/2019   Procedure: LEFT HEART CATH AND  CORS/GRAFTS ANGIOGRAPHY;  Surgeon: [name], MD;  Location: [place] CV LAB;  Service: Cardiovascular;  Laterality: N/A;   NODE DISSECTION Right 12/24/2020   Procedure: NODE DISSECTION;  Surgeon: [name], MD;  Location: [place];  Service: Thoracic;  Laterality: Right;   PERIPHERAL VASCULAR CATHETERIZATION N/A 06/14/2016   Procedure: Lower Extremity Angiography;  Surgeon: Lorretta Harp, MD;  Location: [place] [facility];  Service: Cardiovascular;  Laterality: N/A;   VIDEO BRONCHOSCOPY WITH ENDOBRONCHIAL NAVIGATION Right 12/24/2020   Procedure: VIDEO BRONCHOSCOPY WITH ENDOBRONCHIAL NAVIGATION;  Surgeon: Garner Nash, DO;  Location: [place];  Service: Pulmonary;  Laterality: Right;   VIDEO BRONCHOSCOPY WITH ENDOBRONCHIAL ULTRASOUND N/A 12/24/2020   Procedure: VIDEO BRONCHOSCOPY WITH ENDOBRONCHIAL ULTRASOUND;  Surgeon: Garner Nash, DO;  Location: [place];  Service: Pulmonary;  Laterality: N/A;   VIDEO BRONCHOSCOPY WITH INSERTION OF INTERBRONCHIAL VALVE (IBV) N/A 12/31/2020   Procedure: VIDEO BRONCHOSCOPY WITH INSERTION OF INTERBRONCHIAL VALVE (IBV).VALVE IN CARTRIDGE 52mm,9mm. CHEST TUBE PLACEMENT.;  Surgeon: [name], MD;  [facility];  Service: Thoracic;  Laterality: N/A;    REVIEW OF SYSTEMS:  Constitutional: positive for fatigue and weight loss Eyes: negative Ears, nose, mouth, throat, and face: negative Respiratory: positive for dyspnea on exertion Cardiovascular: negative Gastrointestinal: negative Genitourinary:negative Integument/breast: negative Hematologic/lymphatic: negative Musculoskeletal:positive for back pain Neurological: negative Behavioral/Psych: negative Endocrine: negative Allergic/Immunologic: negative   PHYSICAL EXAMINATION: General appearance: alert, cooperative, fatigued, and no distress Head: Normocephalic, without obvious abnormality, atraumatic Neck: no adenopathy, no JVD, supple, symmetrical, trachea  midline, and thyroid not enlarged, symmetric, no tenderness/mass/nodules Lymph nodes: Cervical, supraclavicular, and axillary nodes normal. Resp: clear to auscultation bilaterally Back: negative Cardio: regular rate and rhythm, S1, S2 normal, no murmur, click, rub or gallop GI: soft, non-tender; bowel sounds normal; no masses,  no organomegaly Extremities: extremities normal, atraumatic, no cyanosis or edema Neurologic: Alert and oriented X 3, normal strength and tone. Normal symmetric reflexes. Normal coordination and gait  ECOG PERFORMANCE STATUS: 1 - Symptomatic but completely ambulatory  Blood pressure 120/75, pulse 85, temperature (!) 97.2 F (36.2 C), temperature source Tympanic, resp. rate 19, height $RemoveBe'5\' 9"'[id number]$  (1.753 m), weight 190 lb 4.8 oz (86.3 kg), SpO2 97 %.  LABORATORY DATA: Lab Results  Component Value Date   WBC 15.6 (H) 03/02/2021   HGB 10.0 (L) 03/02/2021   HCT 30.6 (L) 03/02/2021   MCV 90.8 03/02/2021   PLT 299 03/02/2021      Chemistry      Component Value Date/Time   NA 139 03/02/2021 0515   K 4.0 03/02/2021 0515   CL 104 03/02/2021 0515   CO2 26 03/02/2021 0515   BUN 19 03/02/2021 0515   CREATININE 1.38 (H) 03/02/2021 0515   CREATININE 1.52 (H) 02/16/2021 1425   CREATININE 1.00 02/12/2016 1038      Component Value Date/Time   CALCIUM 8.9 03/02/2021 0515   ALKPHOS 73 03/02/2021 0515   AST 19 03/02/2021 0515   AST 20 02/16/2021 1425   ALT 22 03/02/2021 0515   ALT 25 02/16/2021 1425   BILITOT 0.3 03/02/2021 0515   BILITOT 0.9 02/16/2021 1425       RADIOGRAPHIC STUDIES: DG Chest 1 View  Result Date: 02/25/2021 CLINICAL DATA:  Post thoracentesis EXAM: CHEST  1 VIEW COMPARISON:  02/24/2021 radiograph and chest CT FINDINGS: Post sternotomy changes. Right-sided central venous catheter tip over the proximal right atrium. Trace right-sided pleural effusion. No visible pneumothorax. Stable cardiomediastinal silhouette with aortic atherosclerosis. Surgical  hardware at the thoracic inlet region. IMPRESSION: 1. Trace right pleural effusion without visible pneumothorax post thoracentesis. 2. No significant changes. Electronically Signed   By: Donavan Foil M.D.   On: 02/25/2021 15:27   DG Abd 1 View  Result Date: 02/10/2021 CLINICAL DATA:  Right flank pain EXAM: ABDOMEN - 1 VIEW COMPARISON:  12/20/2018 FINDINGS: The bowel gas pattern is normal. No radio-opaque calculi or other significant  radiographic abnormality are seen. Small right pleural effusion. Partially imaged port catheter at the cavoatrial junction. IMPRESSION: No urinary tract calculi identified. Electronically Signed   By: Macy Mis M.D.   On: 02/10/2021 14:02   CT Angio Chest PE W and/or Wo Contrast  Result Date: 02/28/2021 CLINICAL DATA:  Neuroendocrine lung carcinoma with brain metastases, currently on chemo Per EMS, Pt, from home, c/o SOB w/ exertion and chronic upper abdominal pain. Pt was discharged yesterday. EXAM: CT ANGIOGRAPHY CHEST WITH CONTRAST TECHNIQUE: Multidetector CT imaging of the chest was performed using the standard protocol during bolus administration of intravenous contrast. Multiplanar CT image reconstructions and MIPs were obtained to evaluate the vascular anatomy. CONTRAST:  94m OMNIPAQUE IOHEXOL 350 MG/ML SOLN COMPARISON:  CT angiography chest 02/24/2021, 02/14/2021 FINDINGS: Cardiovascular: Fairly satisfactory opacification of the pulmonary arteries to the segmental level. Limited evaluation of the subsegmental level due to motion artifact. No evidence of pulmonary embolism. The main pulmonary artery is normal in caliber. Normal heart size. No significant pericardial effusion. The thoracic aorta is stable in caliber measuring up to 3.8 cm on axial imaging (5:135). At least mild atherosclerotic plaque of the thoracic aorta. Coronary artery calcifications status post coronary artery bypass graft. Mediastinum/Nodes: No enlarged mediastinal, hilar, or axillary lymph  nodes. Thyroid gland, trachea, and esophagus demonstrate no significant findings. Lungs/Pleura: Status post right upper lobectomy. Persistent mucous plugging noted within the right lower lobe. Interval decrease in size of a trace right pleural effusion. Passive atelectasis of the right lower lobe. No left pleural effusion. Linear atelectasis of the right middle lobe. No focal consolidation. No pulmonary nodule, no pulmonary mass. Upper Abdomen: No acute abnormality. A couple of splenules are again noted and partially visualized (282, 5:318) Musculoskeletal: No chest wall abnormality. No suspicious lytic or blastic osseous lesions. No acute displaced fracture. Multilevel degenerative changes of the spine. Anterior surgical hardware of the C7-T1 level. Review of the MIP images confirms the above findings. IMPRESSION: 1. No pulmonary embolus. 2. Interval decrease in trace right pleural effusion in a patient status post right upper lobectomy. 3. Persistent right lower lobe bronchial debris/mucous plugging. No associated parenchymal or interstitial infection. 4.  Aortic Atherosclerosis (ICD10-I70.0). Electronically Signed   By: MIven FinnM.D.   On: 02/28/2021 23:12   CT Angio Chest PE W and/or Wo Contrast  Result Date: 02/24/2021 CLINICAL DATA:  Lung cancer, chemotherapy, shortness of breath EXAM: CT ANGIOGRAPHY CHEST WITH CONTRAST TECHNIQUE: Multidetector CT imaging of the chest was performed using the standard protocol during bolus administration of intravenous contrast. Multiplanar CT image reconstructions and MIPs were obtained to evaluate the vascular anatomy. CONTRAST:  1010mOMNIPAQUE IOHEXOL 350 MG/ML SOLN COMPARISON:  02/14/2021 FINDINGS: Cardiovascular: Satisfactory opacification of the pulmonary arteries to the segmental level. No evidence of pulmonary embolism. Normal heart size. No pericardial effusion. Coronary artery calcification. Post CABG. Thoracic aorta is stable in caliber measuring dilated  at 4 cm along the ascending portion. Mediastinum/Nodes: No new enlarged lymph nodes. Lungs/Pleura: Increased, small to moderate right pleural effusion with adjacent atelectasis. Post right upper lobectomy. Upper Abdomen: No acute abnormality. Musculoskeletal: No acute osseous abnormality. Review of the MIP images confirms the above findings. IMPRESSION: No acute pulmonary embolism. Increased, small to moderate right pleural effusion. Electronically Signed   By: PrMacy Mis.D.   On: 02/24/2021 10:12   CT Angio Chest PE W and/or Wo Contrast  Result Date: 02/14/2021 CLINICAL DATA:  Recent surgery. Cancer. Right flank pain. Tachycardia. Pulmonary embolus suspected with  high probability. Recent diagnosis of UTI without improvement. History of lung cancer metastatic to brain. EXAM: CT ANGIOGRAPHY CHEST CT ABDOMEN AND PELVIS WITH CONTRAST TECHNIQUE: Multidetector CT imaging of the chest was performed using the standard protocol during bolus administration of intravenous contrast. Multiplanar CT image reconstructions and MIPs were obtained to evaluate the vascular anatomy. Multidetector CT imaging of the abdomen and pelvis was performed using the standard protocol during bolus administration of intravenous contrast. CONTRAST:  126m OMNIPAQUE IOHEXOL 350 MG/ML SOLN COMPARISON:  CT chest 12/12/2020.  CT abdomen and pelvis 02/11/2021 FINDINGS: CTA CHEST FINDINGS Cardiovascular: There is good opacification of the central and segmental pulmonary arteries. No focal filling defects. No evidence of significant pulmonary embolus. Ascending aortic aneurysm measuring 4 cm in diameter, unchanged. No aortic dissection. Great vessel origins are patent. Coronary artery and aortic calcification. Postoperative changes consistent with coronary bypass. Normal heart size. No pericardial effusions. Mediastinum/Nodes: Esophagus is decompressed. No significant lymphadenopathy in the chest. Scattered lymph nodes are not pathologically  enlarged. Lungs/Pleura: Small right pleural effusion. Mild patchy areas of airspace disease in the right lung could represent early multifocal pneumonia. Left lung is clear. Previous right upper lung nodule has been resected in the interval. Musculoskeletal: Degenerative changes in the spine. Postoperative changes in the lower cervical spine. Sternotomy wires. Old rib fractures. Review of the MIP images confirms the above findings. CT ABDOMEN and PELVIS FINDINGS Hepatobiliary: Subcentimeter lesion in the dome of the liver likely representing a cavernous hemangioma. Gallbladder and bile ducts are unremarkable. Pancreas: Unremarkable. No pancreatic ductal dilatation or surrounding inflammatory changes. Spleen: Normal in size without focal abnormality. Adrenals/Urinary Tract: No adrenal gland nodules. Cyst in the upper pole left kidney. Nephrograms are symmetrical and otherwise homogeneous. No hydronephrosis or hydroureter. Bladder is unremarkable. Stomach/Bowel: Stomach, small bowel, and colon are not abnormally distended. No wall thickening or inflammatory changes are apparent. Scattered diverticula in the sigmoid colon without evidence of diverticulitis. Appendix is normal. Vascular/Lymphatic: Aortic atherosclerosis. No enlarged abdominal or pelvic lymph nodes. Reproductive: Prostate is unremarkable. Other: No free air or free fluid in the abdomen. Abdominal wall musculature appears intact. Musculoskeletal: Degenerative changes in the spine. No destructive bone lesions. Review of the MIP images confirms the above findings. IMPRESSION: 1. No evidence of significant pulmonary embolus. 2. Small right pleural effusion. Patchy infiltrates in the right lung are nonspecific but could indicate early multifocal pneumonia. 3. No acute process demonstrated in the abdomen or pelvis. 4. Aortic atherosclerosis. Electronically Signed   By: WLucienne CapersM.D.   On: 02/14/2021 22:09   MR Brain W Wo Contrast  Result Date:  02/05/2021 CLINICAL DATA:  Follow-up CNS neoplasm EXAM: MRI HEAD WITHOUT AND WITH CONTRAST TECHNIQUE: Multiplanar, multiecho pulse sequences of the brain and surrounding structures were obtained without and with intravenous contrast. CONTRAST:  190mMULTIHANCE GADOBENATE DIMEGLUMINE 529 MG/ML IV SOLN COMPARISON:  01/27/2021 FINDINGS: BRAIN New Lesions: 3 mm nodule at the high left and parasagittal frontal lobe seen on 12:147 Larger lesions: None. Stable or Smaller lesions: 6 mm mm enhancing lesion located in the lateral left cerebellum and seen on 11:40. Other Brain findings: Mild vasogenic edema around the left cerebellar metastasis. No acute infarct, acute hemorrhage, hydrocephalus, or collection. Vascular: Normal flow voids and vascular enhancements Skull and upper cervical spine: Normal marrow signal Sinuses/Orbits: Negative IMPRESSION: 1. 3 mm newly seen metastasis in the high left frontal lobe. 2. 6 mm unchanged left cerebellar metastasis. Electronically Signed   By: JoNeva Seat.  On: 02/05/2021 06:09   MR THORACIC SPINE WO CONTRAST  Result Date: 02/19/2021 CLINICAL DATA:  Initial evaluation for intervertebral disc disorder, ongoing back pain. EXAM: MRI THORACIC SPINE WITHOUT CONTRAST TECHNIQUE: Multiplanar, multisequence MR imaging of the thoracic spine was performed. No intravenous contrast was administered. COMPARISON:  Previous MRI from 09/28/2018. FINDINGS: Alignment: Physiologic with preservation of the normal thoracic kyphosis. No listhesis. Vertebrae: Vertebral body height well maintained without acute or chronic fracture. Bone marrow signal intensity within normal limits. No discrete or worrisome osseous lesions. No abnormal marrow edema. Susceptibility artifact from prior ACDF noted at C7-T1. Cord:  Normal signal and morphology. Paraspinal and other soft tissues: Paraspinous soft tissues within normal limits. Small to moderate layering left pleural effusion. Visualized visceral structures  otherwise unremarkable. Disc levels: T1-2:  Unremarkable. T2-3: Unremarkable. T3-4:  Negative interspace.  Mild facet hypertrophy.  No stenosis. T4-5: Negative interspace. Mild right-sided facet hypertrophy. No stenosis. T5-6:  Negative interspace.  Mild facet hypertrophy.  No stenosis. T6-7:  Unremarkable. T7-8:  Unremarkable. T8-9: Right paracentral to subarticular disc protrusion indents the right ventral thecal sac (series 21, image 26). Mild flattening of the right ventral cord without cord signal changes or significant spinal stenosis. Superimposed right-sided reactive endplate spurring. Foramina remain patent. T9-10: Unremarkable. T10-11:  Unremarkable. T11-12: Minimal disc bulge. Mild facet hypertrophy. No canal or foraminal stenosis. T12-L1:  Unremarkable. IMPRESSION: 1. Right paracentral disc protrusion at T8-9 with secondary mild flattening of the right ventral cord, but no cord signal changes or significant stenosis. 2. Mild multilevel facet hypertrophy throughout the thoracic spine as detailed above. Findings could contribute to back pain. 3. Small to moderate layering left pleural effusion. Electronically Signed   By: Jeannine Boga M.D.   On: 02/19/2021 01:54   MR LUMBAR SPINE WO CONTRAST  Result Date: 02/19/2021 CLINICAL DATA:  Initial evaluation for ongoing lower back pain. EXAM: MRI LUMBAR SPINE WITHOUT CONTRAST TECHNIQUE: Multiplanar, multisequence MR imaging of the lumbar spine was performed. No intravenous contrast was administered. COMPARISON:  Prior radiograph from 09/10/2014. FINDINGS: Segmentation: Standard. Lowest well-formed disc space labeled the L5-S1 level. Alignment: Trace retrolisthesis of L2 on L3. Alignment otherwise normal with preservation of the normal lumbar lordosis. Vertebrae: Vertebral body height well maintained without acute or chronic fracture. Bone marrow signal intensity within normal limits. 1.5 cm benign hemangioma noted within the L1 vertebral body. No other  discrete or worrisome osseous lesions. No abnormal marrow edema. Conus medullaris and cauda equina: Conus extends to the L1 level. Conus and cauda equina appear normal. Paraspinal and other soft tissues: Paraspinous soft tissues within normal limits. 2.4 cm T2 hyperintense simple cyst noted within the interpolar left kidney. Visualized visceral structures otherwise unremarkable. Disc levels: L1-2: Degenerative intervertebral disc space narrowing with disc desiccation and diffuse disc bulge. Associated mild reactive endplate change. Minimal facet spurring. No significant spinal stenosis. Foramina remain patent. L2-3: Trace retrolisthesis. Degenerative intervertebral disc space narrowing with diffuse disc bulge and disc desiccation. Mild facet and ligament flavum hypertrophy. No significant spinal stenosis. Foramina remain patent. L3-4: Degenerative intervertebral disc space narrowing with disc desiccation and diffuse disc bulge. Superimposed broad-based left foraminal to extraforaminal disc protrusion contacts the exiting left L4 nerve root as it courses of the left neural foramen (series 8, image 22). Mild bilateral facet hypertrophy. No significant spinal stenosis. Mild left L3 foraminal narrowing. Right neural foramen remains patent. L4-5: Negative interspace. Mild to moderate bilateral facet hypertrophy. Borderline mild narrowing of the lateral recesses bilaterally. Central canal remains patent.  Mild bilateral L4 foraminal stenosis. No frank impingement. L5-S1: Disc desiccation with mild disc bulge, slightly eccentric to the right. Associated right paracentral annular fissure at the level of the descending right S1 nerve root sheath. Mild bilateral facet hypertrophy. No canal or lateral recess stenosis. Foramina remain patent. No impingement. IMPRESSION: 1. Broad-based left foraminal to extraforaminal disc protrusion at L3-4, contacting and potentially irritating the exiting left L4 nerve root. 2. Right eccentric  disc bulge with annular fissure at L5-S1, closely approximating and potentially irritating the descending right S1 nerve root. 3. Additional mild for age degenerative disc disease and facet hypertrophy elsewhere within the lumbar spine as above. No other significant stenosis or neural impingement. Electronically Signed   By: Jeannine Boga M.D.   On: 02/19/2021 02:02   CT ABDOMEN PELVIS W CONTRAST  Result Date: 02/14/2021 CLINICAL DATA:  Recent surgery. Cancer. Right flank pain. Tachycardia. Pulmonary embolus suspected with high probability. Recent diagnosis of UTI without improvement. History of lung cancer metastatic to brain. EXAM: CT ANGIOGRAPHY CHEST CT ABDOMEN AND PELVIS WITH CONTRAST TECHNIQUE: Multidetector CT imaging of the chest was performed using the standard protocol during bolus administration of intravenous contrast. Multiplanar CT image reconstructions and MIPs were obtained to evaluate the vascular anatomy. Multidetector CT imaging of the abdomen and pelvis was performed using the standard protocol during bolus administration of intravenous contrast. CONTRAST:  148m OMNIPAQUE IOHEXOL 350 MG/ML SOLN COMPARISON:  CT chest 12/12/2020.  CT abdomen and pelvis 02/11/2021 FINDINGS: CTA CHEST FINDINGS Cardiovascular: There is good opacification of the central and segmental pulmonary arteries. No focal filling defects. No evidence of significant pulmonary embolus. Ascending aortic aneurysm measuring 4 cm in diameter, unchanged. No aortic dissection. Great vessel origins are patent. Coronary artery and aortic calcification. Postoperative changes consistent with coronary bypass. Normal heart size. No pericardial effusions. Mediastinum/Nodes: Esophagus is decompressed. No significant lymphadenopathy in the chest. Scattered lymph nodes are not pathologically enlarged. Lungs/Pleura: Small right pleural effusion. Mild patchy areas of airspace disease in the right lung could represent early multifocal  pneumonia. Left lung is clear. Previous right upper lung nodule has been resected in the interval. Musculoskeletal: Degenerative changes in the spine. Postoperative changes in the lower cervical spine. Sternotomy wires. Old rib fractures. Review of the MIP images confirms the above findings. CT ABDOMEN and PELVIS FINDINGS Hepatobiliary: Subcentimeter lesion in the dome of the liver likely representing a cavernous hemangioma. Gallbladder and bile ducts are unremarkable. Pancreas: Unremarkable. No pancreatic ductal dilatation or surrounding inflammatory changes. Spleen: Normal in size without focal abnormality. Adrenals/Urinary Tract: No adrenal gland nodules. Cyst in the upper pole left kidney. Nephrograms are symmetrical and otherwise homogeneous. No hydronephrosis or hydroureter. Bladder is unremarkable. Stomach/Bowel: Stomach, small bowel, and colon are not abnormally distended. No wall thickening or inflammatory changes are apparent. Scattered diverticula in the sigmoid colon without evidence of diverticulitis. Appendix is normal. Vascular/Lymphatic: Aortic atherosclerosis. No enlarged abdominal or pelvic lymph nodes. Reproductive: Prostate is unremarkable. Other: No free air or free fluid in the abdomen. Abdominal wall musculature appears intact. Musculoskeletal: Degenerative changes in the spine. No destructive bone lesions. Review of the MIP images confirms the above findings. IMPRESSION: 1. No evidence of significant pulmonary embolus. 2. Small right pleural effusion. Patchy infiltrates in the right lung are nonspecific but could indicate early multifocal pneumonia. 3. No acute process demonstrated in the abdomen or pelvis. 4. Aortic atherosclerosis. Electronically Signed   By: WLucienne CapersM.D.   On: 02/14/2021 22:09   CT ABDOMEN PELVIS  W CONTRAST  Result Date: 02/11/2021 CLINICAL DATA:  Right flank pain and right upper quadrant tenderness. Kidney stones suspected. Recent surgery for cancer removal.  First chemotherapy yesterday. EXAM: CT ABDOMEN AND PELVIS WITH CONTRAST TECHNIQUE: Multidetector CT imaging of the abdomen and pelvis was performed using the standard protocol following bolus administration of intravenous contrast. CONTRAST:  144mL OMNIPAQUE IOHEXOL 300 MG/ML  SOLN COMPARISON:  11/18/2020 FINDINGS: Lower chest: Small right pleural effusion. Hepatobiliary: No focal liver abnormality is seen. No gallstones, gallbladder wall thickening, or biliary dilatation. Pancreas: Unremarkable. No pancreatic ductal dilatation or surrounding inflammatory changes. Spleen: Normal in size without focal abnormality. Adrenals/Urinary Tract: No adrenal gland nodules. Small cyst on the left kidney. Nephrograms are symmetrical. No hydronephrosis or hydroureter. No renal or ureteral stones identified. Bladder is unremarkable. Stomach/Bowel: Stomach, small bowel, and colon are not abnormally distended. No wall thickening or inflammatory changes are appreciated. Appendix is normal. Vascular/Lymphatic: Aortic atherosclerosis. No enlarged abdominal or pelvic lymph nodes. Reproductive: Prostate is unremarkable. Other: No free air or free fluid in the abdomen. Small focal subcutaneous emphysema in the anterior abdominal wall, likely injection site. No infiltration or collection in the subcutaneous fat to suggest postoperative complication. Musculoskeletal: No acute or significant osseous findings. IMPRESSION: 1. No renal or ureteral stone or obstruction. 2. No acute process demonstrated in the abdomen or pelvis. 3. Aortic atherosclerosis. 4. Small right pleural effusion. Electronically Signed   By: Lucienne Capers M.D.   On: 02/11/2021 01:13   DG Chest Port 1 View  Result Date: 02/28/2021 CLINICAL DATA:  Pt c/o SOB w/ exertion and chronic upper abdominal pain. H/o CAD, Stroke, Myocardial Infarction. Former smoker. dyspnea EXAM: PORTABLE CHEST 1 VIEW COMPARISON:  02/25/2021 FINDINGS: Port in the anterior chest wall with tip in  distal SVC. Sternotomy wires overlie normal cardiac silhouette. Normal pulmonary vasculature. Small chronic RIGHT effusion. No infiltrate or pneumothorax. No acute osseous abnormality. Anterior cervical fusion IMPRESSION: 1. Small chronic RIGHT effusion. 2. No pneumonia or edema. Electronically Signed   By: Suzy Bouchard M.D.   On: 02/28/2021 19:19   DG Chest Port 1 View  Result Date: 02/24/2021 CLINICAL DATA:  Chest pain and shortness of breath. History of lung cancer. Question sepsis. EXAM: PORTABLE CHEST 1 VIEW COMPARISON:  01/07/2021 FINDINGS: Power port placed from a right internal jugular approach has its tip at the SVC RA junction. Previous median sternotomy and CABG. Bronchial valves present on the right. Previous lobectomy on the right. No sign of active infiltrate or collapse. There may be a small amount of sub pulmonic effusion on the right. IMPRESSION: Bronchial valves on the right. Small amount of sub pulmonic effusion on the right. No pneumonia visible by radiography. Electronically Signed   By: Nelson Chimes M.D.   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 He presents for port catheter placement to establish durable venous access. EXAM: IMPLANTED PORT A CATH PLACEMENT WITH ULTRASOUND AND FLUOROSCOPIC GUIDANCE MEDICATIONS: None. ANESTHESIA/SEDATION: Versed 2 mg IV; Fentanyl 100 mcg IV; Moderate Sedation Time:  23 minutes The patient was continuously monitored during the procedure by the interventional radiology nurse under my direct supervision. FLUOROSCOPY TIME:  0 minutes, 18 seconds (6 mGy) COMPLICATIONS: None immediate. PROCEDURE: The right neck and chest was prepped with chlorhexidine, and draped in the usual sterile fashion using maximum barrier technique (cap and mask, sterile gown, sterile gloves, large sterile sheet, hand  hygiene and cutaneous antiseptic). Local  anesthesia was attained by infiltration with 1% lidocaine with epinephrine. Ultrasound demonstrated patency of the right internal jugular vein, and this was documented with an image. Under real-time ultrasound guidance, this vein was accessed with a 21 gauge micropuncture needle and image documentation was performed. A small dermatotomy was made at the access site with an 11 scalpel. A 0.018" wire was advanced into the SVC and the access needle exchanged for a 10F micropuncture vascular sheath. The 0.018" wire was then removed and a 0.035" wire advanced into the IVC. An appropriate location for the subcutaneous reservoir was selected below the clavicle and an incision was made through the skin and underlying soft tissues. The subcutaneous tissues were then dissected using a combination of blunt and sharp surgical technique and a pocket was formed. A single lumen power injectable portacatheter was then tunneled through the subcutaneous tissues from the pocket to the dermatotomy and the port reservoir placed within the subcutaneous pocket. The venous access site was then serially dilated and a peel away vascular sheath placed over the wire. The wire was removed and the port catheter advanced into position under fluoroscopic guidance. The catheter tip is positioned in the superior cavoatrial junction. This was documented with a spot image. The portacatheter was then tested and found to flush and aspirate well. The port was flushed with saline followed by 100 units/mL heparinized saline. The pocket was then closed in two layers using first subdermal inverted interrupted absorbable sutures followed by a running subcuticular suture. The epidermis was then sealed with Dermabond. The dermatotomy at the venous access site was also closed with Dermabond. IMPRESSION: Successful placement of a right IJ approach Power Port with ultrasound and fluoroscopic guidance. The catheter is ready for use. Electronically Signed   By: Jacqulynn Cadet M.D.   On: 02/06/2021 14:33   LONG TERM MONITOR-LIVE TELEMETRY (3-14 DAYS)  Result Date: 02/09/2021  Normal sinus rhythm with rare PACs and PVCs.  Brief, string of PACs not associated with symptoms.  No sustained pathologic arrhythmias.  Patch Wear Time:  14 days and 0 hours (2022-05-14T12:06:36-0400 to 2022-05-28T12:06:36-0400) Patient had a min HR of 45 bpm, max HR of 203 bpm, and avg HR of 82 bpm. Predominant underlying rhythm was Sinus Rhythm. 1 run of Supraventricular Tachycardia occurred lasting 6 beats with a max rate of 203 bpm (avg 199 bpm). Isolated SVEs were rare (<1.0%), SVE Couplets were rare (<1.0%), and SVE Triplets were rare (<1.0%). Isolated VEs were rare (<1.0%), and no VE Couplets or VE Triplets were present.   US Abdomen Limited RUQ (LIVER/GB)  Result Date: 02/11/2021 CLINICAL DATA:  Right upper quadrant pain EXAM: ULTRASOUND ABDOMEN LIMITED RIGHT UPPER QUADRANT COMPARISON:  10/24/2020 FINDINGS: Gallbladder: No gallstones or wall thickening visualized. No sonographic Murphy sign noted by sonographer. Common bile duct: Diameter: Normal caliber, 4 mm Liver: No focal lesion identified. Within normal limits in parenchymal echogenicity. Portal vein is patent on color Doppler imaging with normal direction of blood flow towards the liver. Other: None. IMPRESSION: No acute findings. Electronically Signed   By: Rolm Baptise M.D.   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 Request received for diagnostic and therapeutic right thoracentesis. EXAM: ULTRASOUND  GUIDED DIAGNOSTIC AND THERAPEUTIC RIGHT THORACENTESIS MEDICATIONS: 1% lidocaine to skin and subcutaneous tissue COMPLICATIONS: None  immediate. PROCEDURE: An ultrasound guided thoracentesis was thoroughly discussed with the patient and questions answered. The benefits, risks, alternatives and complications were also discussed. The patient understands and wishes to proceed with the procedure. Written consent was obtained. Ultrasound was performed to localize and mark an adequate pocket of fluid in the right chest. The area was then prepped and draped in the normal sterile fashion. 1% Lidocaine was used for local anesthesia. Under ultrasound guidance a 6 Fr Safe-T-Centesis catheter was introduced. Thoracentesis was performed. The catheter was removed and a dressing applied. FINDINGS: A total of approximately 220 cc of hazy, amber fluid was removed. Samples were sent to the laboratory as requested by the clinical team. IMPRESSION: Successful ultrasound guided diagnostic and therapeutic right thoracentesis yielding 220 cc of pleural fluid. Read by: Rowe Robert, PA-C Electronically Signed   By: Aletta Edouard M.D.   On: 02/25/2021 14:51    ASSESSMENT AND PLAN: This is a very pleasant 65 years old white male recently diagnosed with a stage IV (T1c, N1, M1 B) large cell neuroendocrine carcinoma presented with right upper lobe lung nodule in addition to right hilar adenopathy in April 2022 and the patient was found to have solitary brain metastasis in May 2022 He is status post right upper lobectomy with lymph node sampling under the care of Dr. Kipp Brood. The patient is currently undergoing systemic chemotherapy with cisplatin 80 mg/M2 on day 1 and etoposide 100 Mg/M2 on days 1, 2 and 3 every 3 weeks.  He tolerated the first cycle of his treatment well with no concerning adverse effect except for few admission to the hospital with neutropenia and neutropenic fever as well as bacteremia.  He is feeling much better today but just discharged from the hospital yesterday. For the neutropenic fever, I will arrange for the patient to receive Neulasta  injection after his chemotherapy starting from cycle #2. He is also undergoing SRS to the solitary brain metastasis. I recommended for the patient to delay the start of cycle number 2 x 1 week until improvement of his condition. For the persistent back pain, the patient was advised to see his neurosurgeon for evaluation.  In the meantime I will give him prescription for Percocet to be used on as-needed basis until he sees his neurosurgeon. The patient will come back for follow-up visit next week before resuming his treatment. He was advised to call immediately if he has any concerning symptoms in the interval. The patient voices understanding of current disease status and treatment options and is in agreement with the current care plan.  All questions were answered. The patient knows to call the clinic with any problems, questions or concerns. We can certainly see the patient much sooner if necessary.  The total time spent in the appointment was 40 minutes.  Disclaimer: This note was dictated with voice recognition software. Similar sounding words can inadvertently be transcribed and may not be corrected upon review.

## 2021-03-03 NOTE — Telephone Encounter (Signed)
err

## 2021-03-04 ENCOUNTER — Ambulatory Visit: Payer: Medicare Other

## 2021-03-05 ENCOUNTER — Ambulatory Visit: Payer: Medicare Other

## 2021-03-05 ENCOUNTER — Telehealth: Payer: Self-pay | Admitting: Interventional Cardiology

## 2021-03-05 NOTE — Telephone Encounter (Signed)
I spoke with patient. He reports heart rate is fine now and when he is resting. Goes up with exertion.  Has been hospitalized twice recently. He is asking if metoprolol needs to be increased.  Patient was to follow up in afib clinic but had to cancel appointment..  I advised patient it would be best to follow up in afib clinic for heart rate/afib issues.  Patient agreeable with this plan.  He asks to be called tomorrow with appointment information.

## 2021-03-05 NOTE — Telephone Encounter (Signed)
STAT if HR is under 50 or over 120 (normal HR is 60-100 beats per minute)  What is your heart rate? It was at 130, one time it was higher it was higher than that earlier, but did not get the reading  Do you have a log of your heart rate readings (document readings)? no  Do you have any other symptoms? Heart rate seems to be getting worse at time- shortness of breath sometime

## 2021-03-06 ENCOUNTER — Telehealth: Payer: Self-pay | Admitting: Internal Medicine

## 2021-03-06 ENCOUNTER — Telehealth: Payer: Self-pay | Admitting: Medical Oncology

## 2021-03-06 LAB — CULTURE, BLOOD (ROUTINE X 2)
Culture: NO GROWTH
Culture: NO GROWTH
Special Requests: ADEQUATE
Special Requests: ADEQUATE

## 2021-03-06 NOTE — Telephone Encounter (Signed)
  Labs - Pt concerned about  high WBC.  Reviewed elevated WBC/neutro results and correlation with Gran[facility].   He understands scheduling is trying to schedule his chemo for next week and  he is fine with going to another facility.

## 2021-03-06 NOTE — Telephone Encounter (Signed)
Spoke with patient earlier to inform of some appts that were scheduled for next month. Confirmed with patient. Called and left msg about appts next week that were scheduled at [place].

## 2021-03-06 NOTE — Telephone Encounter (Signed)
Called and spoke with patient.  He states he is waiting for Oncology to call and set up an appointment, hopefully, for some time next week.  Patient will call back once he has his Oncology appt so we can schedule an appt in [facility] for him.

## 2021-03-09 ENCOUNTER — Inpatient Hospital Stay (HOSPITAL_BASED_OUTPATIENT_CLINIC_OR_DEPARTMENT_OTHER): Payer: Medicare Other | Admitting: Internal Medicine

## 2021-03-09 ENCOUNTER — Other Ambulatory Visit: Payer: Self-pay

## 2021-03-09 ENCOUNTER — Inpatient Hospital Stay: Payer: Medicare Other

## 2021-03-09 ENCOUNTER — Ambulatory Visit: Payer: Medicare Other | Admitting: Internal Medicine

## 2021-03-09 ENCOUNTER — Other Ambulatory Visit: Payer: Medicare Other

## 2021-03-09 VITALS — BP 123/75 | HR 82 | Temp 98.0°F | Resp 18 | Ht 69.0 in | Wt 187.8 lb

## 2021-03-09 DIAGNOSIS — C7A1 Malignant poorly differentiated neuroendocrine tumors: Secondary | ICD-10-CM | POA: Diagnosis not present

## 2021-03-09 DIAGNOSIS — C3491 Malignant neoplasm of unspecified part of right bronchus or lung: Secondary | ICD-10-CM

## 2021-03-09 DIAGNOSIS — Z95828 Presence of other vascular implants and grafts: Secondary | ICD-10-CM

## 2021-03-09 DIAGNOSIS — C7931 Secondary malignant neoplasm of brain: Secondary | ICD-10-CM

## 2021-03-09 DIAGNOSIS — Z5111 Encounter for antineoplastic chemotherapy: Secondary | ICD-10-CM

## 2021-03-09 LAB — CMP (CANCER CENTER ONLY)
ALT: 15 U/L (ref 0–44)
AST: 13 U/L — ABNORMAL LOW (ref 15–41)
Albumin: 3.6 g/dL (ref 3.5–5.0)
Alkaline Phosphatase: 83 U/L (ref 38–126)
Anion gap: 9 (ref 5–15)
BUN: 25 mg/dL — ABNORMAL HIGH (ref 8–23)
CO2: 25 mmol/L (ref 22–32)
Calcium: 9.8 mg/dL (ref 8.9–10.3)
Chloride: 107 mmol/L (ref 98–111)
Creatinine: 1.21 mg/dL (ref 0.61–1.24)
GFR, Estimated: >60 mL/min (ref >60)
Glucose, Bld: 105 mg/dL — ABNORMAL HIGH (ref 70–99)
Potassium: 4.2 mmol/L (ref 3.5–5.1)
Sodium: 141 mmol/L (ref 135–145)
Total Bilirubin: 0.4 mg/dL (ref 0.3–1.2)
Total Protein: 7.3 g/dL (ref 6.5–8.1)

## 2021-03-09 LAB — CBC WITH DIFFERENTIAL (CANCER CENTER ONLY)
Abs Immature Granulocytes: 0.14 10*3/uL — ABNORMAL HIGH (ref 0.00–0.07)
Basophils Absolute: 0.1 10*3/uL (ref 0.0–0.1)
Basophils Relative: 1 %
Eosinophils Absolute: 0.3 10*3/uL (ref 0.0–0.5)
Eosinophils Relative: 2 %
HCT: 34 % — ABNORMAL LOW (ref 39.0–52.0)
Hemoglobin: 11.4 g/dL — ABNORMAL LOW (ref 13.0–17.0)
Immature Granulocytes: 1 %
Lymphocytes Relative: 25 %
Lymphs Abs: 2.6 10*3/uL (ref 0.7–4.0)
MCH: 29.8 pg (ref 26.0–34.0)
MCHC: 33.5 g/dL (ref 30.0–36.0)
MCV: 89 fL (ref 80.0–100.0)
Monocytes Absolute: 0.7 10*3/uL (ref 0.1–1.0)
Monocytes Relative: 7 %
Neutro Abs: 6.9 10*3/uL (ref 1.7–7.7)
Neutrophils Relative %: 64 %
Platelet Count: 290 10*3/uL (ref 150–400)
RBC: 3.82 MIL/uL — ABNORMAL LOW (ref 4.22–5.81)
RDW: 13.2 % (ref 11.5–15.5)
WBC Count: 10.7 10*3/uL — ABNORMAL HIGH (ref 4.0–10.5)
nRBC: 0 % (ref 0.0–0.2)

## 2021-03-09 LAB — MAGNESIUM: Magnesium: 2.1 mg/dL (ref 1.7–2.4)

## 2021-03-09 MED ORDER — SODIUM CHLORIDE 0.9% FLUSH
10.0000 mL | INTRAVENOUS | Status: AC | PRN
  Administered 2021-03-09: 10 mL
  Filled 2021-03-09: qty 10

## 2021-03-09 MED ORDER — HEPARIN SOD (PORK) LOCK FLUSH 100 UNIT/ML IV SOLN
500.0000 [IU] | INTRAVENOUS | Status: AC | PRN
  Administered 2021-03-09: 500 [IU]
  Filled 2021-03-09: qty 5

## 2021-03-09 NOTE — Progress Notes (Signed)
[place] [phone]   [phone]  OFFICE PROGRESS NOTE  Anthony Melter, MD [name] [id number]  DIAGNOSIS:  Stage IV (pT1c, N1, M1b) large cell neuroendocrine carcinoma.  [name] presented with a right upper lobe lung nodule and hilar lymphadenopathy.  [name] was diagnosed in April 2022. [name] also had a small metastatic brain lesion.    PDL1: 0%   PRIOR THERAPY: Robotic assisted thoracic surgery with a right upper lobectomy which was performed on 12/24/2020.     CURRENT THERAPY: Adjuvant chemotherapy with cisplatin 80 mg/m2 on day 1, etoposide 100 mg per metered squared on days 1, 2, and 3, IV every 3 weeks. First dose on 02/09/21.  Status post 1 cycle.  INTERVAL HISTORY: [name] 65 y.o. male returns to Anthony clinic today for follow-up visit.  [name] is feeling much better today except for Anthony persistent fatigue and weakness.  [name] also continues to have back pain and is scheduled to work with physical therapy and rehabilitation soon after referral from his neurosurgeon.  [name] denied having any current shortness of breath except with exertion with no cough or hemoptysis.  [name] denied having any fever or chills.  [name] has no nausea, vomiting, diarrhea or constipation.  [name] has no headache or visual changes.  His treatment was delayed from last week because of Anthony recent hospitalization with questionable pneumonia and neutropenic fever.  MEDICAL HISTORY: Past Medical History:  Diagnosis Date   Anxiety    Arthritis    Basal cell carcinoma (BCC) of forehead    CAD (coronary artery disease)    a. 10/2015 ant STEMI >> LHC with 3 v CAD; oLAD tx with POBA >> emergent CABG. b. Multiple evals since that time, early graft failure of SVG-RCA by cath 03/2016. c. 2/19 PCI/DES x1 to pRCA, normal EF.   Carotid artery disease ([place])    a. 40-59% BICA 02/2018.   Depression    Dyspnea    Ectopic atrial tachycardia (HCC)    Esophageal  reflux    eosinophil esophagitis   Family history of adverse reaction to anesthesia    "sister has PONV" (06/21/2017)   Former tobacco use    Gout    Hepatitis C    "treated and cured" (06/21/2017)   High cholesterol    History of kidney stones    Hypertension    Ischemic cardiomyopathy    a. EF 25-30% at intraop TEE 4/17  //  b. Limited Echo 5/17 - EF 45-50%, mild ant HK. c. EF 55-65% by cath 09/2017.   Migraine    "3-4/yr" (06/21/2017)   Myocardial infarction ([place]) 10/2015   Palpitations    Sinus bradycardia    a. HR dropping into 40s in 02/2016 -> BB reduced.   Stroke [facility]) 10/2016   "small one; sometimes my memory/cognitive issues" (06/21/2017)   Symptomatic hypotension    a. 02/2016 ER visit -> meds reduced.   Syncope    Wears dentures    Wears glasses     ALLERGIES:  is allergic to prednisone, tetanus toxoids, wellbutrin [bupropion], indomethacin, other, scallops [shellfish allergy], and varenicline.  MEDICATIONS:  Current Outpatient Medications  Medication Sig Dispense Refill   acetaminophen (TYLENOL) 500 MG tablet Take 1,000 mg by mouth every 6 (six) hours as needed for moderate pain.     albuterol (VENTOLIN HFA) 108 (90 Base) MCG/ACT inhaler Inhale 2 puffs into Anthony lungs every 6 (six)  hours as needed for wheezing or shortness of breath. 8 g 2   amiodarone (PACERONE) 200 MG tablet Take 1 tablet (200 mg total) by mouth daily. 30 tablet 1   amLODipine (NORVASC) 10 MG tablet TAKE 1 TABLET(10 MG) BY MOUTH DAILY 90 tablet 1   aspirin EC 81 MG tablet Take 81 mg by mouth daily. Swallow whole.     atorvastatin (LIPITOR) 80 MG tablet TAKE 1 TABLET(80 MG) BY MOUTH DAILY 90 tablet 2   clopidogrel (PLAVIX) 75 MG tablet TAKE 1 TABLET BY MOUTH EVERY DAY 90 tablet 1   fluticasone (FLONASE) 50 MCG/ACT nasal spray Place 2 sprays into both nostrils daily as needed for allergies.     lidocaine-prilocaine (EMLA) cream Apply 1 application topically once as needed (port access). (Villa not  taking: Reported on 03/03/2021)     magnesium oxide (MAG-OX) 400 (240 Mg) MG tablet Take 1 tablet (400 mg total) by mouth daily. 30 tablet 1   methocarbamol (ROBAXIN) 500 MG tablet Take 1 tablet (500 mg total) by mouth every 6 (six) hours as needed for muscle spasms. (Villa not taking: Reported on 03/03/2021) 30 tablet 0   metoprolol succinate (TOPROL-XL) 25 MG 24 hr tablet Take 1 tablet (25 mg total) by mouth daily. 90 tablet 3   Multiple Vitamin (MULTIVITAMIN WITH MINERALS) TABS tablet Take 1 tablet by mouth daily. Centrum     nitroGLYCERIN (NITROSTAT) 0.4 MG SL tablet PLACE 1 TABLET UNDER Anthony TONGUE EVERY 5 MINUTES AS NEEDED FOR CHEST PAIN. 3 DOSES MAX (Villa not taking: Reported on 03/03/2021) 25 tablet 4   ondansetron (ZOFRAN) 8 MG tablet Take 1 tablet (8 mg total) by mouth every 8 (eight) hours as needed for nausea or vomiting. Starting 3 days after chemotherapy (Villa not taking: Reported on 03/03/2021) 30 tablet 2   oxyCODONE-acetaminophen (PERCOCET/ROXICET) 5-325 MG tablet Take 1 tablet by mouth every 4 (four) hours as needed for severe pain. 30 tablet 0   polyethylene glycol (MIRALAX / GLYCOLAX) 17 g packet Take 17 g by mouth daily as needed. (Villa not taking: Reported on 03/03/2021) 14 each 0   prochlorperazine (COMPAZINE) 10 MG tablet Take 1 tablet (10 mg total) by mouth every 6 (six) hours as needed. 30 tablet 2   ranolazine (RANEXA) 1000 MG SR tablet Take 1 tablet (1,000 mg total) by mouth 2 (two) times daily. 180 tablet 0   Tiotropium Bromide-Olodaterol (STIOLTO RESPIMAT) 2.5-2.5 MCG/ACT AERS Inhale 2 puffs into Anthony lungs daily. 4 g 3   No current facility-administered medications for this visit.    SURGICAL HISTORY:  Past Surgical History:  Procedure Laterality Date   25 GAUGE PARS PLANA VITRECTOMY WITH 20 GAUGE MVR PORT  12/31/2020   Procedure: 25 GAUGE PARS PLANA VITRECTOMY WITH 20 GAUGE MVR PORT;  Surgeon: [name], MD;  [facility];  Service: Thoracic;;    ANTERIOR CERVICAL DECOMP/DISCECTOMY FUSION N/A 10/17/2018   Procedure: Anterior Cervical Decompression Fusion - Cervical seven -Thoracic one;  Surgeon: Consuella Lose, MD;  Location: [facility] [place];  Service: Neurosurgery;  Laterality: N/A;   BASAL CELL CARCINOMA EXCISION     "forehead   BIOPSY  07/20/2019   Procedure: BIOPSY;  Surgeon: Carol Ada, MD;  Location: [facility];  Service: Endoscopy;;   BRONCHIAL BIOPSY  12/24/2020   Procedure: BRONCHIAL BIOPSIES;  Surgeon: Garner Nash, DO;  Location: [facility];  Service: Pulmonary;;   BRONCHIAL BRUSHINGS  12/24/2020   Procedure: BRONCHIAL BRUSHINGS;  Surgeon: Garner Nash, DO;  Location: [facility];  Service: Pulmonary;;   BRONCHIAL NEEDLE ASPIRATION BIOPSY  12/24/2020   Procedure: BRONCHIAL NEEDLE ASPIRATION BIOPSIES;  Surgeon: Garner Nash, DO;  Location: [facility];  Service: Pulmonary;;   CARDIAC CATHETERIZATION N/A 11/28/2015   Procedure: Left Heart Cath and Coronary Angiography;  Surgeon: [name], MD;  Location: [place] [facility];  Service: Cardiovascular;  Laterality: N/A;   CARDIAC CATHETERIZATION N/A 11/28/2015   Procedure: Coronary Balloon Angioplasty;  Surgeon: [name], MD;  Location: [place] [facility];  Service: Cardiovascular;  Laterality: N/A;  ostial LAD   CARDIAC CATHETERIZATION N/A 11/28/2015   Procedure: Coronary/Graft Angiography;  Surgeon: [name], MD;  Location: [facility];  Service: Cardiovascular;  Laterality: N/A;  coronaries only    CARDIAC CATHETERIZATION N/A 04/21/2016   Procedure: Left Heart Cath and Coronary Angiography;  Surgeon: Wellington Hampshire, MD;  Location: [place] [facility];  Service: Cardiovascular;  Laterality: N/A;   CARDIAC CATHETERIZATION N/A 06/14/2016   Procedure: Left Heart Cath and Cors/Grafts Angiography;  Surgeon: Lorretta Harp, MD;  Location: [place] [facility];  Service: Cardiovascular;  Laterality: N/A;   CARDIAC CATHETERIZATION N/A  09/08/2016   Procedure: Left Heart Cath and Cors/Grafts Angiography;  Surgeon: Wellington Hampshire, MD;  Location: [facility] [facility];  Service: Cardiovascular;  Laterality: N/A;   CARDIAC CATHETERIZATION     CORONARY ARTERY BYPASS GRAFT N/A 11/28/2015   Procedure: CORONARY ARTERY BYPASS GRAFTING (CABG) TIMES FIVE USING LEFT INTERNAL MAMMARY ARTERY AND RIGHT GREATER SAPHENOUS,VIEN HARVEATED BY ENDOVIEN, INTRAOPPRATIVE TEE;  Surgeon: Gaye Pollack, MD;  Location: [facility];  Service: Open Heart Surgery;  Laterality: N/A;   CORONARY STENT INTERVENTION N/A 10/05/2017   Procedure: CORONARY STENT INTERVENTION;  Surgeon: [name], MD;  Location: [place] [facility];  Service: Cardiovascular;  Laterality: N/A;   ESOPHAGOGASTRODUODENOSCOPY (EGD) WITH PROPOFOL N/A 07/20/2019   Procedure: ESOPHAGOGASTRODUODENOSCOPY (EGD) WITH PROPOFOL;  Surgeon: Carol Ada, MD;  [facility];  Service: Endoscopy;  Laterality: N/A;   ESOPHAGOGASTRODUODENOSCOPY (EGD) WITH PROPOFOL N/A 01/30/2021   Procedure: ESOPHAGOGASTRODUODENOSCOPY (EGD) WITH PROPOFOL;  Surgeon: Carol Ada, MD;  [facility];  Service: Endoscopy;  Laterality: N/A;   FIDUCIAL MARKER PLACEMENT  12/24/2020   Procedure: FIDUCIAL DYE MARKER PLACEMENT;  Surgeon: Garner Nash, DO;  Location: [facility] [place] [facility];  Service: Pulmonary;;   HUMERUS SURGERY Right 1969   "tumor inside bone; filled it w/bone chips"   INTERCOSTAL NERVE BLOCK Right 12/24/2020   Procedure: INTERCOSTAL NERVE BLOCK;  Surgeon: [name], MD;  Location: [facility];  Service: Thoracic;  Laterality: Right;   IR IMAGING GUIDED PORT INSERTION  02/06/2021   LEFT HEART CATH AND CORS/GRAFTS ANGIOGRAPHY N/A 03/11/2017   Procedure: Left Heart Cath and Cors/Grafts Angiography;  Surgeon: Leonie Man, MD;  Location: [place];  Service: Cardiovascular;  Laterality: N/A;   LEFT HEART CATH AND CORS/GRAFTS ANGIOGRAPHY N/A 10/05/2017   Procedure: LEFT HEART CATH AND  CORS/GRAFTS ANGIOGRAPHY;  Surgeon: [name], MD;  Location: [place] [facility];  Service: Cardiovascular;  Laterality: N/A;   LEFT HEART CATH AND CORS/GRAFTS ANGIOGRAPHY N/A 04/11/2019   Procedure: LEFT HEART CATH AND CORS/GRAFTS ANGIOGRAPHY;  Surgeon: [name], MD;  Location: [place] [facility];  Service: Cardiovascular;  Laterality: N/A;   NODE DISSECTION Right 12/24/2020   Procedure: NODE DISSECTION;  Surgeon: [name], MD;  Location: [facility];  Service: Thoracic;  Laterality: Right;  PERIPHERAL VASCULAR CATHETERIZATION N/A 06/14/2016   Procedure: Lower Extremity Angiography;  Surgeon: Lorretta Harp, MD;  Location: [place] [facility];  Service: Cardiovascular;  Laterality: N/A;   VIDEO BRONCHOSCOPY WITH ENDOBRONCHIAL NAVIGATION Right 12/24/2020   Procedure: VIDEO BRONCHOSCOPY WITH ENDOBRONCHIAL NAVIGATION;  Surgeon: Garner Nash, DO;  Location: [place];  Service: Pulmonary;  Laterality: Right;   VIDEO BRONCHOSCOPY WITH ENDOBRONCHIAL ULTRASOUND N/A 12/24/2020   Procedure: VIDEO BRONCHOSCOPY WITH ENDOBRONCHIAL ULTRASOUND;  Surgeon: Garner Nash, DO;  Location: [place];  Service: Pulmonary;  Laterality: N/A;   VIDEO BRONCHOSCOPY WITH INSERTION OF INTERBRONCHIAL VALVE (IBV) N/A 12/31/2020   Procedure: VIDEO BRONCHOSCOPY WITH INSERTION OF INTERBRONCHIAL VALVE (IBV).VALVE IN CARTRIDGE 39m,9mm. CHEST TUBE PLACEMENT.;  Surgeon: LLajuana Matte MD;  [facility];  Service: Thoracic;  Laterality: N/A;    REVIEW OF SYSTEMS:  Constitutional: positive for anorexia and fatigue Eyes: negative Ears, nose, mouth, throat, and face: negative Respiratory: positive for dyspnea on exertion Cardiovascular: negative Gastrointestinal: negative Genitourinary:negative Integument/breast: negative Hematologic/lymphatic: negative Musculoskeletal:positive for back pain Neurological: negative Behavioral/Psych: negative Endocrine: negative Allergic/Immunologic:  negative   PHYSICAL EXAMINATION: General appearance: alert, cooperative, fatigued, and no distress Head: Normocephalic, without obvious abnormality, atraumatic Neck: no adenopathy, no JVD, supple, symmetrical, trachea midline, and thyroid not enlarged, symmetric, no tenderness/mass/nodules Lymph nodes: Cervical, supraclavicular, and axillary nodes normal. Resp: clear to auscultation bilaterally Back: negative Cardio: regular rate and rhythm, S1, S2 normal, no murmur, click, rub or gallop GI: soft, non-tender; bowel sounds normal; no masses,  no organomegaly Extremities: extremities normal, atraumatic, no cyanosis or edema Neurologic: Alert and oriented X 3, normal strength and tone. Normal symmetric reflexes. Normal coordination and gait  ECOG PERFORMANCE STATUS: 1 - Symptomatic but completely ambulatory  Blood pressure 123/75, pulse 82, temperature 98 F (36.7 C), temperature source Oral, resp. rate 18, height 5' 9"  (1.753 m), weight 187 lb 12.8 oz (85.2 kg), SpO2 100 %.  LABORATORY DATA: Lab Results  Component Value Date   WBC 10.7 (H) 03/09/2021   HGB 11.4 (L) 03/09/2021   HCT 34.0 (L) 03/09/2021   MCV 89.0 03/09/2021   PLT 290 03/09/2021      Chemistry      Component Value Date/Time   NA 143 03/03/2021 0811   K 3.6 03/03/2021 0811   CL 106 03/03/2021 0811   CO2 26 03/03/2021 0811   BUN 35 (H) 03/03/2021 0811   CREATININE 1.37 (H) 03/03/2021 0811   CREATININE 1.00 02/12/2016 1038      Component Value Date/Time   CALCIUM 9.2 03/03/2021 0811   ALKPHOS 83 03/03/2021 0811   AST 17 03/03/2021 0811   ALT 23 03/03/2021 0811   BILITOT <0.2 (L) 03/03/2021 0811       RADIOGRAPHIC STUDIES: DG Chest 1 View  Result Date: 02/25/2021 CLINICAL DATA:  Post thoracentesis EXAM: CHEST  1 VIEW COMPARISON:  02/24/2021 radiograph and chest CT FINDINGS: Post sternotomy changes. Right-sided central venous catheter tip over Anthony proximal right atrium. Trace right-sided pleural effusion.  No visible pneumothorax. Stable cardiomediastinal silhouette with aortic atherosclerosis. Surgical hardware at Anthony thoracic inlet region. IMPRESSION: 1. Trace right pleural effusion without visible pneumothorax post thoracentesis. 2. No significant changes. Electronically Signed   By: KDonavan FoilM.D.   On: 02/25/2021 15:27   DG Abd 1 View  Result Date: 02/10/2021 CLINICAL DATA:  Right flank pain EXAM: ABDOMEN - 1 VIEW COMPARISON:  12/20/2018 FINDINGS: Anthony bowel gas pattern is normal. No radio-opaque calculi or other significant radiographic  abnormality are seen. Small right pleural effusion. Partially imaged port catheter at Anthony cavoatrial junction. IMPRESSION: No urinary tract calculi identified. Electronically Signed   By: Macy Mis M.D.   On: 02/10/2021 14:02   CT Angio Chest PE W and/or Wo Contrast  Result Date: 02/28/2021 CLINICAL DATA:  Neuroendocrine lung carcinoma with brain metastases, currently on chemo Per EMS, Pt, from home, c/o SOB w/ exertion and chronic upper abdominal pain. Pt was discharged yesterday. EXAM: CT ANGIOGRAPHY CHEST WITH CONTRAST TECHNIQUE: Multidetector CT imaging of Anthony chest was performed using Anthony standard protocol during bolus administration of intravenous contrast. Multiplanar CT image reconstructions and MIPs were obtained to evaluate Anthony vascular anatomy. CONTRAST:  19m OMNIPAQUE IOHEXOL 350 MG/ML SOLN COMPARISON:  CT angiography chest 02/24/2021, 02/14/2021 FINDINGS: Cardiovascular: Fairly satisfactory opacification of Anthony pulmonary arteries to Anthony segmental level. Limited evaluation of Anthony subsegmental level due to motion artifact. No evidence of pulmonary embolism. Anthony main pulmonary artery is normal in caliber. Normal heart size. No significant pericardial effusion. Anthony thoracic aorta is stable in caliber measuring up to 3.8 cm on axial imaging (5:135). At least mild atherosclerotic plaque of Anthony thoracic aorta. Coronary artery calcifications status post  coronary artery bypass graft. Mediastinum/Nodes: No enlarged mediastinal, hilar, or axillary lymph nodes. Thyroid gland, trachea, and esophagus demonstrate no significant findings. Lungs/Pleura: Status post right upper lobectomy. Persistent mucous plugging noted within Anthony right lower lobe. Interval decrease in size of a trace right pleural effusion. Passive atelectasis of Anthony right lower lobe. No left pleural effusion. Linear atelectasis of Anthony right middle lobe. No focal consolidation. No pulmonary nodule, no pulmonary mass. Upper Abdomen: No acute abnormality. A couple of splenules are again noted and partially visualized (282, 5:318) Musculoskeletal: No chest wall abnormality. No suspicious lytic or blastic osseous lesions. No acute displaced fracture. Multilevel degenerative changes of Anthony spine. Anterior surgical hardware of Anthony C7-T1 level. Review of Anthony MIP images confirms Anthony above findings. IMPRESSION: 1. No pulmonary embolus. 2. Interval decrease in trace right pleural effusion in a Villa status post right upper lobectomy. 3. Persistent right lower lobe bronchial debris/mucous plugging. No associated parenchymal or interstitial infection. 4.  Aortic Atherosclerosis (ICD10-I70.0). Electronically Signed   By: MIven FinnM.D.   On: 02/28/2021 23:12   CT Angio Chest PE W and/or Wo Contrast  Result Date: 02/24/2021 CLINICAL DATA:  Lung cancer, chemotherapy, shortness of breath EXAM: CT ANGIOGRAPHY CHEST WITH CONTRAST TECHNIQUE: Multidetector CT imaging of Anthony chest was performed using Anthony standard protocol during bolus administration of intravenous contrast. Multiplanar CT image reconstructions and MIPs were obtained to evaluate Anthony vascular anatomy. CONTRAST:  1032mOMNIPAQUE IOHEXOL 350 MG/ML SOLN COMPARISON:  02/14/2021 FINDINGS: Cardiovascular: Satisfactory opacification of Anthony pulmonary arteries to Anthony segmental level. No evidence of pulmonary embolism. Normal heart size. No pericardial  effusion. Coronary artery calcification. Post CABG. Thoracic aorta is stable in caliber measuring dilated at 4 cm along Anthony ascending portion. Mediastinum/Nodes: No new enlarged lymph nodes. Lungs/Pleura: Increased, small to moderate right pleural effusion with adjacent atelectasis. Post right upper lobectomy. Upper Abdomen: No acute abnormality. Musculoskeletal: No acute osseous abnormality. Review of Anthony MIP images confirms Anthony above findings. IMPRESSION: No acute pulmonary embolism. Increased, small to moderate right pleural effusion. Electronically Signed   By: PrMacy Mis.D.   On: 02/24/2021 10:12   CT Angio Chest PE W and/or Wo Contrast  Result Date: 02/14/2021 CLINICAL DATA:  Recent surgery. Cancer. Right flank pain. Tachycardia. Pulmonary embolus suspected with high  probability. Recent diagnosis of UTI without improvement. History of lung cancer metastatic to brain. EXAM: CT ANGIOGRAPHY CHEST CT ABDOMEN AND PELVIS WITH CONTRAST TECHNIQUE: Multidetector CT imaging of Anthony chest was performed using Anthony standard protocol during bolus administration of intravenous contrast. Multiplanar CT image reconstructions and MIPs were obtained to evaluate Anthony vascular anatomy. Multidetector CT imaging of Anthony abdomen and pelvis was performed using Anthony standard protocol during bolus administration of intravenous contrast. CONTRAST:  16mL OMNIPAQUE IOHEXOL 350 MG/ML SOLN COMPARISON:  CT chest 12/12/2020.  CT abdomen and pelvis 02/11/2021 FINDINGS: CTA CHEST FINDINGS Cardiovascular: There is good opacification of Anthony central and segmental pulmonary arteries. No focal filling defects. No evidence of significant pulmonary embolus. Ascending aortic aneurysm measuring 4 cm in diameter, unchanged. No aortic dissection. Great vessel origins are patent. Coronary artery and aortic calcification. Postoperative changes consistent with coronary bypass. Normal heart size. No pericardial effusions. Mediastinum/Nodes: Esophagus is  decompressed. No significant lymphadenopathy in Anthony chest. Scattered lymph nodes are not pathologically enlarged. Lungs/Pleura: Small right pleural effusion. Mild patchy areas of airspace disease in Anthony right lung could represent early multifocal pneumonia. Left lung is clear. Previous right upper lung nodule has been resected in Anthony interval. Musculoskeletal: Degenerative changes in Anthony spine. Postoperative changes in Anthony lower cervical spine. Sternotomy wires. Old rib fractures. Review of Anthony MIP images confirms Anthony above findings. CT ABDOMEN and PELVIS FINDINGS Hepatobiliary: Subcentimeter lesion in Anthony dome of Anthony liver likely representing a cavernous hemangioma. Gallbladder and bile ducts are unremarkable. Pancreas: Unremarkable. No pancreatic ductal dilatation or surrounding inflammatory changes. Spleen: Normal in size without focal abnormality. Adrenals/Urinary Tract: No adrenal gland nodules. Cyst in Anthony upper pole left kidney. Nephrograms are symmetrical and otherwise homogeneous. No hydronephrosis or hydroureter. Bladder is unremarkable. Stomach/Bowel: Stomach, small bowel, and colon are not abnormally distended. No wall thickening or inflammatory changes are apparent. Scattered diverticula in Anthony sigmoid colon without evidence of diverticulitis. Appendix is normal. Vascular/Lymphatic: Aortic atherosclerosis. No enlarged abdominal or pelvic lymph nodes. Reproductive: Prostate is unremarkable. Other: No free air or free fluid in Anthony abdomen. Abdominal wall musculature appears intact. Musculoskeletal: Degenerative changes in Anthony spine. No destructive bone lesions. Review of Anthony MIP images confirms Anthony above findings. IMPRESSION: 1. No evidence of significant pulmonary embolus. 2. Small right pleural effusion. Patchy infiltrates in Anthony right lung are nonspecific but could indicate early multifocal pneumonia. 3. No acute process demonstrated in Anthony abdomen or pelvis. 4. Aortic atherosclerosis. Electronically  Signed   By: Lucienne Capers M.D.   On: 02/14/2021 22:09   MR THORACIC SPINE WO CONTRAST  Result Date: 02/19/2021 CLINICAL DATA:  Initial evaluation for intervertebral disc disorder, ongoing back pain. EXAM: MRI THORACIC SPINE WITHOUT CONTRAST TECHNIQUE: Multiplanar, multisequence MR imaging of Anthony thoracic spine was performed. No intravenous contrast was administered. COMPARISON:  Previous MRI from 09/28/2018. FINDINGS: Alignment: Physiologic with preservation of Anthony normal thoracic kyphosis. No listhesis. Vertebrae: Vertebral body height well maintained without acute or chronic fracture. Bone marrow signal intensity within normal limits. No discrete or worrisome osseous lesions. No abnormal marrow edema. Susceptibility artifact from prior ACDF noted at C7-T1. Cord:  Normal signal and morphology. Paraspinal and other soft tissues: Paraspinous soft tissues within normal limits. Small to moderate layering left pleural effusion. Visualized visceral structures otherwise unremarkable. Disc levels: T1-2:  Unremarkable. T2-3: Unremarkable. T3-4:  Negative interspace.  Mild facet hypertrophy.  No stenosis. T4-5: Negative interspace. Mild right-sided facet hypertrophy. No stenosis. T5-6:  Negative interspace.  Mild facet hypertrophy.  No stenosis. T6-7:  Unremarkable. T7-8:  Unremarkable. T8-9: Right paracentral to subarticular disc protrusion indents Anthony right ventral thecal sac (series 21, image 26). Mild flattening of Anthony right ventral cord without cord signal changes or significant spinal stenosis. Superimposed right-sided reactive endplate spurring. Foramina remain patent. T9-10: Unremarkable. T10-11:  Unremarkable. T11-12: Minimal disc bulge. Mild facet hypertrophy. No canal or foraminal stenosis. T12-L1:  Unremarkable. IMPRESSION: 1. Right paracentral disc protrusion at T8-9 with secondary mild flattening of Anthony right ventral cord, but no cord signal changes or significant stenosis. 2. Mild multilevel facet  hypertrophy throughout Anthony thoracic spine as detailed above. Findings could contribute to back pain. 3. Small to moderate layering left pleural effusion. Electronically Signed   By: Jeannine Boga M.D.   On: 02/19/2021 01:54   MR LUMBAR SPINE WO CONTRAST  Result Date: 02/19/2021 CLINICAL DATA:  Initial evaluation for ongoing lower back pain. EXAM: MRI LUMBAR SPINE WITHOUT CONTRAST TECHNIQUE: Multiplanar, multisequence MR imaging of Anthony lumbar spine was performed. No intravenous contrast was administered. COMPARISON:  Prior radiograph from 09/10/2014. FINDINGS: Segmentation: Standard. Lowest well-formed disc space labeled Anthony L5-S1 level. Alignment: Trace retrolisthesis of L2 on L3. Alignment otherwise normal with preservation of Anthony normal lumbar lordosis. Vertebrae: Vertebral body height well maintained without acute or chronic fracture. Bone marrow signal intensity within normal limits. 1.5 cm benign hemangioma noted within Anthony L1 vertebral body. No other discrete or worrisome osseous lesions. No abnormal marrow edema. Conus medullaris and cauda equina: Conus extends to Anthony L1 level. Conus and cauda equina appear normal. Paraspinal and other soft tissues: Paraspinous soft tissues within normal limits. 2.4 cm T2 hyperintense simple cyst noted within Anthony interpolar left kidney. Visualized visceral structures otherwise unremarkable. Disc levels: L1-2: Degenerative intervertebral disc space narrowing with disc desiccation and diffuse disc bulge. Associated mild reactive endplate change. Minimal facet spurring. No significant spinal stenosis. Foramina remain patent. L2-3: Trace retrolisthesis. Degenerative intervertebral disc space narrowing with diffuse disc bulge and disc desiccation. Mild facet and ligament flavum hypertrophy. No significant spinal stenosis. Foramina remain patent. L3-4: Degenerative intervertebral disc space narrowing with disc desiccation and diffuse disc bulge. Superimposed broad-based  left foraminal to extraforaminal disc protrusion contacts Anthony exiting left L4 nerve root as it courses of Anthony left neural foramen (series 8, image 22). Mild bilateral facet hypertrophy. No significant spinal stenosis. Mild left L3 foraminal narrowing. Right neural foramen remains patent. L4-5: Negative interspace. Mild to moderate bilateral facet hypertrophy. Borderline mild narrowing of Anthony lateral recesses bilaterally. Central canal remains patent. Mild bilateral L4 foraminal stenosis. No frank impingement. L5-S1: Disc desiccation with mild disc bulge, slightly eccentric to Anthony right. Associated right paracentral annular fissure at Anthony level of Anthony descending right S1 nerve root sheath. Mild bilateral facet hypertrophy. No canal or lateral recess stenosis. Foramina remain patent. No impingement. IMPRESSION: 1. Broad-based left foraminal to extraforaminal disc protrusion at L3-4, contacting and potentially irritating Anthony exiting left L4 nerve root. 2. Right eccentric disc bulge with annular fissure at L5-S1, closely approximating and potentially irritating Anthony descending right S1 nerve root. 3. Additional mild for age degenerative disc disease and facet hypertrophy elsewhere within Anthony lumbar spine as above. No other significant stenosis or neural impingement. Electronically Signed   By: Jeannine Boga M.D.   On: 02/19/2021 02:02   CT ABDOMEN PELVIS W CONTRAST  Result Date: 02/14/2021 CLINICAL DATA:  Recent surgery. Cancer. Right flank pain. Tachycardia. Pulmonary embolus suspected with high probability. Recent diagnosis of UTI without improvement. History of  lung cancer metastatic to brain. EXAM: CT ANGIOGRAPHY CHEST CT ABDOMEN AND PELVIS WITH CONTRAST TECHNIQUE: Multidetector CT imaging of Anthony chest was performed using Anthony standard protocol during bolus administration of intravenous contrast. Multiplanar CT image reconstructions and MIPs were obtained to evaluate Anthony vascular anatomy. Multidetector CT  imaging of Anthony abdomen and pelvis was performed using Anthony standard protocol during bolus administration of intravenous contrast. CONTRAST:  12m OMNIPAQUE IOHEXOL 350 MG/ML SOLN COMPARISON:  CT chest 12/12/2020.  CT abdomen and pelvis 02/11/2021 FINDINGS: CTA CHEST FINDINGS Cardiovascular: There is good opacification of Anthony central and segmental pulmonary arteries. No focal filling defects. No evidence of significant pulmonary embolus. Ascending aortic aneurysm measuring 4 cm in diameter, unchanged. No aortic dissection. Great vessel origins are patent. Coronary artery and aortic calcification. Postoperative changes consistent with coronary bypass. Normal heart size. No pericardial effusions. Mediastinum/Nodes: Esophagus is decompressed. No significant lymphadenopathy in Anthony chest. Scattered lymph nodes are not pathologically enlarged. Lungs/Pleura: Small right pleural effusion. Mild patchy areas of airspace disease in Anthony right lung could represent early multifocal pneumonia. Left lung is clear. Previous right upper lung nodule has been resected in Anthony interval. Musculoskeletal: Degenerative changes in Anthony spine. Postoperative changes in Anthony lower cervical spine. Sternotomy wires. Old rib fractures. Review of Anthony MIP images confirms Anthony above findings. CT ABDOMEN and PELVIS FINDINGS Hepatobiliary: Subcentimeter lesion in Anthony dome of Anthony liver likely representing a cavernous hemangioma. Gallbladder and bile ducts are unremarkable. Pancreas: Unremarkable. No pancreatic ductal dilatation or surrounding inflammatory changes. Spleen: Normal in size without focal abnormality. Adrenals/Urinary Tract: No adrenal gland nodules. Cyst in Anthony upper pole left kidney. Nephrograms are symmetrical and otherwise homogeneous. No hydronephrosis or hydroureter. Bladder is unremarkable. Stomach/Bowel: Stomach, small bowel, and colon are not abnormally distended. No wall thickening or inflammatory changes are apparent. Scattered  diverticula in Anthony sigmoid colon without evidence of diverticulitis. Appendix is normal. Vascular/Lymphatic: Aortic atherosclerosis. No enlarged abdominal or pelvic lymph nodes. Reproductive: Prostate is unremarkable. Other: No free air or free fluid in Anthony abdomen. Abdominal wall musculature appears intact. Musculoskeletal: Degenerative changes in Anthony spine. No destructive bone lesions. Review of Anthony MIP images confirms Anthony above findings. IMPRESSION: 1. No evidence of significant pulmonary embolus. 2. Small right pleural effusion. Patchy infiltrates in Anthony right lung are nonspecific but could indicate early multifocal pneumonia. 3. No acute process demonstrated in Anthony abdomen or pelvis. 4. Aortic atherosclerosis. Electronically Signed   By: WLucienne CapersM.D.   On: 02/14/2021 22:09   CT ABDOMEN PELVIS W CONTRAST  Result Date: 02/11/2021 CLINICAL DATA:  Right flank pain and right upper quadrant tenderness. Kidney stones suspected. Recent surgery for cancer removal. First chemotherapy yesterday. EXAM: CT ABDOMEN AND PELVIS WITH CONTRAST TECHNIQUE: Multidetector CT imaging of Anthony abdomen and pelvis was performed using Anthony standard protocol following bolus administration of intravenous contrast. CONTRAST:  1067mOMNIPAQUE IOHEXOL 300 MG/ML  SOLN COMPARISON:  11/18/2020 FINDINGS: Lower chest: Small right pleural effusion. Hepatobiliary: No focal liver abnormality is seen. No gallstones, gallbladder wall thickening, or biliary dilatation. Pancreas: Unremarkable. No pancreatic ductal dilatation or surrounding inflammatory changes. Spleen: Normal in size without focal abnormality. Adrenals/Urinary Tract: No adrenal gland nodules. Small cyst on Anthony left kidney. Nephrograms are symmetrical. No hydronephrosis or hydroureter. No renal or ureteral stones identified. Bladder is unremarkable. Stomach/Bowel: Stomach, small bowel, and colon are not abnormally distended. No wall thickening or inflammatory changes are  appreciated. Appendix is normal. Vascular/Lymphatic: Aortic atherosclerosis. No enlarged abdominal or pelvic lymph nodes.  Reproductive: Prostate is unremarkable. Other: No free air or free fluid in Anthony abdomen. Small focal subcutaneous emphysema in Anthony anterior abdominal wall, likely injection site. No infiltration or collection in Anthony subcutaneous fat to suggest postoperative complication. Musculoskeletal: No acute or significant osseous findings. IMPRESSION: 1. No renal or ureteral stone or obstruction. 2. No acute process demonstrated in Anthony abdomen or pelvis. 3. Aortic atherosclerosis. 4. Small right pleural effusion. Electronically Signed   By: Lucienne Capers M.D.   On: 02/11/2021 01:13   DG Chest Port 1 View  Result Date: 02/28/2021 CLINICAL DATA:  Pt c/o SOB w/ exertion and chronic upper abdominal pain. H/o CAD, Stroke, Myocardial Infarction. Former smoker. dyspnea EXAM: PORTABLE CHEST 1 VIEW COMPARISON:  02/25/2021 FINDINGS: Port in Anthony anterior chest wall with tip in distal SVC. Sternotomy wires overlie normal cardiac silhouette. Normal pulmonary vasculature. Small chronic RIGHT effusion. No infiltrate or pneumothorax. No acute osseous abnormality. Anterior cervical fusion IMPRESSION: 1. Small chronic RIGHT effusion. 2. No pneumonia or edema. Electronically Signed   By: Suzy Bouchard M.D.   On: 02/28/2021 19:19   DG Chest Port 1 View  Result Date: 02/24/2021 CLINICAL DATA:  Chest pain and shortness of breath. History of lung cancer. Question sepsis. EXAM: PORTABLE CHEST 1 VIEW COMPARISON:  01/07/2021 FINDINGS: Power port placed from a right internal jugular approach has its tip at Anthony SVC RA junction. Previous median sternotomy and CABG. Bronchial valves present on Anthony right. Previous lobectomy on Anthony right. No sign of active infiltrate or collapse. There may be a small amount of sub pulmonic effusion on Anthony right. IMPRESSION: Bronchial valves on Anthony right. Small amount of sub pulmonic effusion  on Anthony right. No pneumonia visible by radiography. Electronically Signed   By: Nelson Chimes M.D.   On: 02/24/2021 08:40   US Abdomen Limited RUQ (LIVER/GB)  Result Date: 02/11/2021 CLINICAL DATA:  Right upper quadrant pain EXAM: ULTRASOUND ABDOMEN LIMITED RIGHT UPPER QUADRANT COMPARISON:  10/24/2020 FINDINGS: Gallbladder: No gallstones or wall thickening visualized. No sonographic Murphy sign noted by sonographer. Common bile duct: Diameter: Normal caliber, 4 mm Liver: No focal lesion identified. Within normal limits in parenchymal echogenicity. Portal vein is patent on color Doppler imaging with normal direction of blood flow towards Anthony liver. Other: None. IMPRESSION: No acute findings. Electronically Signed   By: Rolm Baptise M.D.   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 Request received for diagnostic and therapeutic right thoracentesis. EXAM: ULTRASOUND GUIDED DIAGNOSTIC AND THERAPEUTIC RIGHT THORACENTESIS MEDICATIONS: 1% lidocaine to skin and subcutaneous tissue COMPLICATIONS: None immediate. PROCEDURE: An ultrasound guided thoracentesis was thoroughly discussed with [name] and questions answered. Anthony benefits, risks, alternatives and complications were also discussed. [name] understands and wishes to proceed with Anthony procedure. Written consent was obtained. Ultrasound was performed to localize and mark an adequate pocket of fluid in Anthony right chest. Anthony area was then prepped and draped in Anthony normal sterile fashion. 1% Lidocaine was used for local anesthesia. Under ultrasound guidance a 6 Fr Safe-T-Centesis catheter was introduced. Thoracentesis was performed. Anthony catheter was removed and a dressing  applied. FINDINGS: A total of approximately 220 cc of hazy, amber fluid was removed. Samples were sent to Anthony laboratory  as requested by Anthony clinical team. IMPRESSION: Successful ultrasound guided diagnostic and therapeutic right thoracentesis yielding 220 cc of pleural fluid. Read by: Rowe Robert, PA-C Electronically Signed   By: Aletta Edouard M.D.   On: 02/25/2021 14:51    ASSESSMENT AND PLAN: This is a very pleasant 65 years old white male recently diagnosed with a stage IV (T1c, N1, M1 B) large cell neuroendocrine carcinoma presented with right upper lobe lung nodule in addition to right hilar adenopathy in April 2022 and [name] was found to have solitary brain metastasis in May 2022 [name] is status post right upper lobectomy with lymph node sampling under Anthony care of Dr. Kipp Brood. [name] is currently undergoing systemic chemotherapy with cisplatin 80 mg/M2 on day 1 and etoposide 100 Mg/M2 on days 1, 2 and 3 every 3 weeks.  [name] tolerated Anthony first cycle of his treatment well with no concerning adverse effect except for few admission to Anthony hospital with neutropenia and neutropenic fever as well as bacteremia.   [name] is also undergoing SRS to Anthony solitary brain metastasis. [name] is feeling much better today. I recommended for him to proceed with cycle #2 of his treatment today as planned.  [name] had a lot of questions today about his condition as well as Anthony prognosis and previous imaging studies. I answered his question to his satisfaction I recommended for [name] to proceed with Anthony treatment as planned today. [name] will come back for follow-up visit in 3 weeks for evaluation before starting cycle #3. [name] was advised to call immediately if [name] has any concerning symptoms in Anthony interval. [name] voices understanding of current disease status and treatment options and is in agreement with Anthony current care plan.  All questions were answered. [name] knows to call  Anthony clinic with any problems, questions or concerns. We can certainly see [name] much sooner if necessary.  Anthony total time spent in Anthony appointment was 45 minutes.  Disclaimer: This note was dictated with voice recognition software. Similar sounding words can inadvertently be transcribed and may not be corrected upon review.

## 2021-03-10 ENCOUNTER — Ambulatory Visit: Payer: Medicare Other | Admitting: Internal Medicine

## 2021-03-11 ENCOUNTER — Other Ambulatory Visit: Payer: Self-pay

## 2021-03-11 ENCOUNTER — Ambulatory Visit: Payer: Medicare Other | Admitting: Thoracic Surgery (Cardiothoracic Vascular Surgery)

## 2021-03-11 ENCOUNTER — Inpatient Hospital Stay: Payer: Medicare Other

## 2021-03-11 VITALS — BP 127/75 | HR 95 | Temp 98.7°F | Resp 18

## 2021-03-11 DIAGNOSIS — Z5111 Encounter for antineoplastic chemotherapy: Secondary | ICD-10-CM | POA: Diagnosis not present

## 2021-03-11 DIAGNOSIS — C7A1 Malignant poorly differentiated neuroendocrine tumors: Secondary | ICD-10-CM

## 2021-03-11 MED ORDER — PALONOSETRON HCL INJECTION 0.25 MG/5ML
0.2500 mg | Freq: Once | INTRAVENOUS | Status: AC
  Administered 2021-03-11: 0.25 mg via INTRAVENOUS
  Filled 2021-03-11: qty 5

## 2021-03-11 MED ORDER — SODIUM CHLORIDE 0.9% FLUSH
10.0000 mL | INTRAVENOUS | Status: DC | PRN
  Administered 2021-03-11: 10 mL
  Filled 2021-03-11: qty 10

## 2021-03-11 MED ORDER — POTASSIUM CHLORIDE IN NACL 20-0.9 MEQ/L-% IV SOLN
INTRAVENOUS | Status: DC
  Filled 2021-03-11 (×2): qty 1000

## 2021-03-11 MED ORDER — SODIUM CHLORIDE 0.9 % IV SOLN
120.0000 mg/m2 | Freq: Once | INTRAVENOUS | Status: AC
  Administered 2021-03-11: 250 mg via INTRAVENOUS
  Filled 2021-03-11: qty 12.5

## 2021-03-11 MED ORDER — SODIUM CHLORIDE 0.9 % IV SOLN
60.0000 mg/m2 | Freq: Once | INTRAVENOUS | Status: AC
  Administered 2021-03-11: 123 mg via INTRAVENOUS
  Filled 2021-03-11: qty 123

## 2021-03-11 MED ORDER — MAGNESIUM SULFATE 2 GM/50ML IV SOLN
2.0000 g | Freq: Once | INTRAVENOUS | Status: AC
Start: 2021-03-11 — End: 2021-03-11
  Administered 2021-03-11: 2 g via INTRAVENOUS
  Filled 2021-03-11: qty 50

## 2021-03-11 MED ORDER — SODIUM CHLORIDE 0.9 % IV SOLN
Freq: Once | INTRAVENOUS | Status: AC
Start: 2021-03-11 — End: 2021-03-11
  Filled 2021-03-11: qty 250

## 2021-03-11 MED ORDER — HEPARIN SOD (PORK) LOCK FLUSH 100 UNIT/ML IV SOLN
500.0000 [IU] | Freq: Once | INTRAVENOUS | Status: AC | PRN
  Administered 2021-03-11: 500 [IU]
  Filled 2021-03-11: qty 5

## 2021-03-11 MED ORDER — SODIUM CHLORIDE 0.9 % IV SOLN
10.0000 mg | Freq: Once | INTRAVENOUS | Status: AC
  Administered 2021-03-11: 10 mg via INTRAVENOUS
  Filled 2021-03-11: qty 1

## 2021-03-11 MED ORDER — SODIUM CHLORIDE 0.9 % IV SOLN
150.0000 mg | Freq: Once | INTRAVENOUS | Status: AC
  Administered 2021-03-11: 150 mg via INTRAVENOUS
  Filled 2021-03-11: qty 5

## 2021-03-11 NOTE — Telephone Encounter (Signed)
Patient called and scheduled appt for 03/19/21 with Roderic Palau, NP.

## 2021-03-11 NOTE — Patient Instructions (Signed)
[facility]  Discharge Instructions: Thank you for choosing [facility] to provide your oncology and hematology care.   If you have a lab appointment with the [facility], please go directly to the [facility] and check in at the registration area.   Wear comfortable clothing and clothing appropriate for easy access to any Portacath or PICC line.   We strive to give you quality time with your provider. You may need to reschedule your appointment if you arrive late (15 or more minutes).  Arriving late affects you and other patients whose appointments are after yours.  Also, if you miss three or more appointments without notifying the office, you may be dismissed from the clinic at the provider's discretion.      For prescription refill requests, have your pharmacy contact our office and allow 72 hours for refills to be completed.    Today you received the following chemotherapy and/or immunotherapy agents: cisplatin, etoposide.    To help prevent nausea and vomiting after your treatment, we encourage you to take your nausea medication as directed.  BELOW ARE SYMPTOMS THAT SHOULD BE REPORTED IMMEDIATELY: *FEVER GREATER THAN 100.4 F (38 C) OR HIGHER *CHILLS OR SWEATING *NAUSEA AND VOMITING THAT IS NOT CONTROLLED WITH YOUR NAUSEA MEDICATION *UNUSUAL SHORTNESS OF BREATH *UNUSUAL BRUISING OR BLEEDING *URINARY PROBLEMS (pain or burning when urinating, or frequent urination) *BOWEL PROBLEMS (unusual diarrhea, constipation, pain near the anus) TENDERNESS IN MOUTH AND THROAT WITH OR WITHOUT PRESENCE OF ULCERS (sore throat, sores in mouth, or a toothache) UNUSUAL RASH, SWELLING OR PAIN  UNUSUAL VAGINAL DISCHARGE OR ITCHING   Items with * indicate a potential emergency and should be followed up as soon as possible or go to the Emergency Department if any problems should occur.  Please show the CHEMOTHERAPY ALERT CARD or IMMUNOTHERAPY ALERT CARD at  check-in to the Emergency Department and triage nurse.  Should you have questions after your visit or need to cancel or reschedule your appointment, please contact [facility]  Dept: [phone]  and follow the prompts.  Office hours are 8:00 a.m. to 4:30 p.m. Monday - Friday. Please note that voicemails left after 4:00 p.m. may not be returned until the following business day.  We are closed weekends and major holidays. You have access to a nurse at all times for urgent questions. Please call the main number to the clinic Dept: [phone] and follow the prompts.   For any non-urgent questions, you may also contact your provider using MyChart. We now offer e-Visits for anyone 57 and older to request care online for non-urgent symptoms. For details visit [email or web address].   Also download the MyChart app! Go to the app store, search "MyChart", open the app, select [facility], and log in with your MyChart username and password.  Due to Covid, a mask is required upon entering the hospital/clinic. If you do not have a mask, one will be given to you upon arrival. For doctor visits, patients may have 1 support person aged 78 or older with them. For treatment visits, patients cannot have anyone with them due to current Covid guidelines and our immunocompromised population.   Cisplatin injection What is this medication? CISPLATIN (SIS pla tin) is a chemotherapy drug. It targets fast dividing cells, like cancer cells, and causes these cells to die. This medicine is used totreat many types of cancer like bladder, ovarian, and testicular cancers. This medicine may be used for other  purposes; ask your health care provider orpharmacist if you have questions. COMMON BRAND NAME(S): Platinol, Platinol -AQ What should I tell my care team before I take this medication? They need to know if you have any of these conditions: eye disease, vision problems hearing problems kidney  disease low blood counts, like white cells, platelets, or red blood cells tingling of the fingers or toes, or other nerve disorder an unusual or allergic reaction to cisplatin, carboplatin, oxaliplatin, other medicines, foods, dyes, or preservatives pregnant or trying to get pregnant breast-feeding How should I use this medication? This drug is given as an infusion into a vein. It is administered in a hospitalor clinic by a specially trained health care professional. Talk to your pediatrician regarding the use of this medicine in children.Special care may be needed. Overdosage: If you think you have taken too much of this medicine contact apoison control center or emergency room at once. NOTE: This medicine is only for you. Do not share this medicine with others. What if I miss a dose? It is important not to miss a dose. Call your doctor or health careprofessional if you are unable to keep an appointment. What may interact with this medication? This medicine may interact with the following medications: foscarnet certain antibiotics like amikacin, gentamicin, neomycin, polymyxin B, streptomycin, tobramycin, vancomycin This list may not describe all possible interactions. Give your health care provider a list of all the medicines, herbs, non-prescription drugs, or dietary supplements you use. Also tell them if you smoke, drink alcohol, or use illegaldrugs. Some items may interact with your medicine. What should I watch for while using this medication? Your condition will be monitored carefully while you are receiving this medicine. You will need important blood work done while you are taking thismedicine. This drug may make you feel generally unwell. This is not uncommon, as chemotherapy can affect healthy cells as well as cancer cells. Report any side effects. Continue your course of treatment even though you feel ill unless yourdoctor tells you to stop. This medicine may increase your risk of  getting an infection. Call your healthcare professional for advice if you get a fever, chills, or sore throat, or other symptoms of a cold or flu. Do not treat yourself. Try to avoid beingaround people who are sick. Avoid taking medicines that contain aspirin, acetaminophen, ibuprofen, naproxen, or ketoprofen unless instructed by your healthcare professional.These medicines may hide a fever. This medicine may increase your risk to bruise or bleed. Call your doctor orhealth care professional if you notice any unusual bleeding. Be careful brushing and flossing your teeth or using a toothpick because you may get an infection or bleed more easily. If you have any dental work done,tell your dentist you are receiving this medicine. Do not become pregnant while taking this medicine or for 14 months after stopping it. Women should inform their healthcare professional if they wish to become pregnant or think they might be pregnant. Men should not father a child while taking this medicine and for 11 months after stopping it. There is potential for serious side effects to an unborn child. Talk to your healthcareprofessional for more information. Do not breast-feed an infant while taking this medicine. This medicine has caused ovarian failure in some women. This medicine may make it more difficult to get pregnant. Talk to your healthcare professional if [place] concerned about your fertility. This medicine has caused decreased sperm counts in some men. This may make it more difficult to father a child.  Talk to your healthcare professional if [place] concerned about your fertility. Drink fluids as directed while you are taking this medicine. This will helpprotect your kidneys. Call your doctor or health care professional if you get diarrhea. Do not treatyourself. What side effects may I notice from receiving this medication? Side effects that you should report to your doctor or health care professionalas soon as  possible: allergic reactions like skin rash, itching or hives, swelling of the face, lips, or tongue blurred vision changes in vision decreased hearing or ringing of the ears nausea, vomiting pain, redness, or irritation at site where injected pain, tingling, numbness in the hands or feet signs and symptoms of bleeding such as bloody or black, tarry stools; red or dark brown urine; spitting up blood or brown material that looks like coffee grounds; red spots on the skin; unusual bruising or bleeding from the eyes, gums, or nose signs and symptoms of infection like fever; chills; cough; sore throat; pain or trouble passing urine signs and symptoms of kidney injury like trouble passing urine or change in the amount of urine signs and symptoms of low red blood cells or anemia such as unusually weak or tired; feeling faint or lightheaded; falls; breathing problems Side effects that usually do not require medical attention (report to yourdoctor or health care professional if they continue or are bothersome): loss of appetite mouth sores muscle cramps This list may not describe all possible side effects. Call your doctor for medical advice about side effects. You may report side effects to FDA at1-800-FDA-1088. Where should I keep my medication? This drug is given in a hospital or clinic and will not be stored at home. NOTE: This sheet is a summary. It may not cover all possible information. If you have questions about this medicine, talk to your doctor, pharmacist, orhealth care provider.  2022 Elsevier/Gold Standard (2018-08-11 15:59:17)  Etoposide, VP-16 capsules What is this medication? ETOPOSIDE, VP-16 (e toe POE side) is a chemotherapy drug. It is used to treatsmall cell lung cancer and other cancers. This medicine may be used for other purposes; ask your health care provider orpharmacist if you have questions. COMMON BRAND NAME(S): VePesid What should I tell my care team before I take  this medication? They need to know if you have any of these conditions: infection kidney disease liver disease low blood counts, like low white cell, platelet, or red cell counts an unusual or allergic reaction to etoposide, other medicines, foods, dyes, or preservatives pregnant or trying to get pregnant breast-feeding How should I use this medication? Take this medicine by mouth with a glass of water. Follow the directions on the prescription label. Do not open, crush, or chew the capsules. It is advisable to wear gloves when handling this medicine. Take your medicine at regular intervals. Do not take it more often than directed. Do not stop taking excepton your doctor's advice. Talk to your pediatrician regarding the use of this medicine in children.Special care may be needed. Overdosage: If you think you have taken too much of this medicine contact apoison control center or emergency room at once. NOTE: This medicine is only for you. Do not share this medicine with others. What if I miss a dose? If you miss a dose, take it as soon as you can. If it is almost time for yournext dose, take only that dose. Do not take double or extra doses. What may interact with this medication? This medicine may interact with the following medications: cyclosporine  warfarin This list may not describe all possible interactions. Give your health care provider a list of all the medicines, herbs, non-prescription drugs, or dietary supplements you use. Also tell them if you smoke, drink alcohol, or use illegaldrugs. Some items may interact with your medicine. What should I watch for while using this medication? Visit your doctor for checks on your progress. This drug may make you feel generally unwell. This is not uncommon, as chemotherapy can affect healthy cells as well as cancer cells. Report any side effects. Continue your course oftreatment even though you feel ill unless your doctor tells you to stop. In  some cases, you may be given additional medicines to help with side effects.Follow all directions for their use. Call your doctor or health care professional for advice if you get a fever, chills or sore throat, or other symptoms of a cold or flu. Do not treat yourself. This drug decreases your body's ability to fight infections. Try toavoid being around people who are sick. This medicine may increase your risk to bruise or bleed. Call your doctor orhealth care professional if you notice any unusual bleeding. Talk to your doctor about your risk of cancer. You may be more at risk forcertain types of cancers if you take this medicine. Do not become pregnant while taking this medicine or for at least 6 months after stopping it. Women should inform their doctor if they wish to become pregnant or think they might be pregnant. Women of child-bearing potential will need to have a negative pregnancy test before starting this medicine. There is a potential for serious side effects to an unborn child. Talk to your health care professional or pharmacist for more information. Do not breast-feed aninfant while taking this medicine. Men must use a latex condom during sexual contact with a woman while taking this medicine and for at least 4 months after stopping it. A latex condom is needed even if you have had a vasectomy. Contact your doctor right away if your partner becomes pregnant. Do not donate sperm while taking this medicine and for 4 months after you stop taking this medicine. Men should inform theirdoctors if they wish to father a child. This medicine may lower sperm counts. What side effects may I notice from receiving this medication? Side effects that you should report to your doctor or health care professionalas soon as possible: allergic reactions like skin rash, itching or hives, swelling of the face, lips, or tongue low blood counts - this medicine may decrease the number of white blood cells, red blood  cells, and platelets. You may be at increased risk for infections and bleeding nausea, vomiting redness, blistering, peeling or loosening of the skin, including inside the mouth signs and symptoms of infection like fever; chills; cough; sore throat; pain or trouble passing urine signs and symptoms of low red blood cells or anemia such as unusually weak or tired; feeling faint or lightheaded; falls; breathing problems unusual bruising or bleeding Side effects that usually do not require medical attention (report to yourdoctor or health care professional if they continue or are bothersome): changes in taste diarrhea hair loss loss of appetite mouth sores This list may not describe all possible side effects. Call your doctor for medical advice about side effects. You may report side effects to FDA at1-800-FDA-1088. Where should I keep my medication? Keep out of the reach of children. Store in a refrigerator between 2 and 8 degrees C (36 and 46 degrees F). Do notfreeze. Throw  away any unused medicine after the expiration date. NOTE: This sheet is a summary. It may not cover all possible information. If you have questions about this medicine, talk to your doctor, pharmacist, orhealth care provider.  2022 Elsevier/Gold Standard (2018-10-11 16:44:55)

## 2021-03-12 ENCOUNTER — Inpatient Hospital Stay: Payer: Medicare Other

## 2021-03-12 VITALS — BP 135/74 | HR 93 | Temp 98.0°F | Resp 18

## 2021-03-12 DIAGNOSIS — Z5111 Encounter for antineoplastic chemotherapy: Secondary | ICD-10-CM | POA: Diagnosis not present

## 2021-03-12 DIAGNOSIS — C7A1 Malignant poorly differentiated neuroendocrine tumors: Secondary | ICD-10-CM

## 2021-03-12 MED ORDER — SODIUM CHLORIDE 0.9% FLUSH
10.0000 mL | INTRAVENOUS | Status: DC | PRN
  Administered 2021-03-12: 10 mL
  Filled 2021-03-12: qty 10

## 2021-03-12 MED ORDER — HEPARIN SOD (PORK) LOCK FLUSH 100 UNIT/ML IV SOLN
500.0000 [IU] | Freq: Once | INTRAVENOUS | Status: AC | PRN
  Administered 2021-03-12: 500 [IU]
  Filled 2021-03-12: qty 5

## 2021-03-12 MED ORDER — SODIUM CHLORIDE 0.9 % IV SOLN
10.0000 mg | Freq: Once | INTRAVENOUS | Status: AC
Start: 2021-03-12 — End: 2021-03-12
  Administered 2021-03-12: 10 mg via INTRAVENOUS
  Filled 2021-03-12: qty 1

## 2021-03-12 MED ORDER — SODIUM CHLORIDE 0.9 % IV SOLN
Freq: Once | INTRAVENOUS | Status: AC
  Filled 2021-03-12: qty 250

## 2021-03-12 MED ORDER — SODIUM CHLORIDE 0.9 % IV SOLN
120.0000 mg/m2 | Freq: Once | INTRAVENOUS | Status: AC
  Administered 2021-03-12: 250 mg via INTRAVENOUS
  Filled 2021-03-12: qty 12.5

## 2021-03-12 NOTE — Patient Instructions (Signed)
[facility]  Discharge Instructions: Thank you for choosing [facility] to provide your oncology and hematology care.   If you have a lab appointment with the [facility], please go directly to the [facility] and check in at the registration area.   Wear comfortable clothing and clothing appropriate for easy access to any Portacath or PICC line.   We strive to give you quality time with your provider. You may need to reschedule your appointment if you arrive late (15 or more minutes).  Arriving late affects you and other patients whose appointments are after yours.  Also, if you miss three or more appointments without notifying the office, you may be dismissed from the clinic at the provider's discretion.      For prescription refill requests, have your pharmacy contact our office and allow 72 hours for refills to be completed.    Today you received the following chemotherapy and/or immunotherapy agents: etoposide.    To help prevent nausea and vomiting after your treatment, we encourage you to take your nausea medication as directed.  BELOW ARE SYMPTOMS THAT SHOULD BE REPORTED IMMEDIATELY: *FEVER GREATER THAN 100.4 F (38 C) OR HIGHER *CHILLS OR SWEATING *NAUSEA AND VOMITING THAT IS NOT CONTROLLED WITH YOUR NAUSEA MEDICATION *UNUSUAL SHORTNESS OF BREATH *UNUSUAL BRUISING OR BLEEDING *URINARY PROBLEMS (pain or burning when urinating, or frequent urination) *BOWEL PROBLEMS (unusual diarrhea, constipation, pain near the anus) TENDERNESS IN MOUTH AND THROAT WITH OR WITHOUT PRESENCE OF ULCERS (sore throat, sores in mouth, or a toothache) UNUSUAL RASH, SWELLING OR PAIN  UNUSUAL VAGINAL DISCHARGE OR ITCHING   Items with * indicate a potential emergency and should be followed up as soon as possible or go to the Emergency Department if any problems should occur.  Please show the CHEMOTHERAPY ALERT CARD or IMMUNOTHERAPY ALERT CARD at check-in to the  Emergency Department and triage nurse.  Should you have questions after your visit or need to cancel or reschedule your appointment, please contact [facility]  Dept: [phone]  and follow the prompts.  Office hours are 8:00 a.m. to 4:30 p.m. Monday - Friday. Please note that voicemails left after 4:00 p.m. may not be returned until the following business day.  We are closed weekends and major holidays. You have access to a nurse at all times for urgent questions. Please call the main number to the clinic Dept: [phone] and follow the prompts.   For any non-urgent questions, you may also contact your provider using MyChart. We now offer e-Visits for anyone 21 and older to request care online for non-urgent symptoms. For details visit [email or web address].   Also download the MyChart app! Go to the app store, search "MyChart", open the app, select [facility], and log in with your MyChart username and password.  Due to Covid, a mask is required upon entering the hospital/clinic. If you do not have a mask, one will be given to you upon arrival. For doctor visits, patients may have 1 support person aged 3 or older with them. For treatment visits, patients cannot have anyone with them due to current Covid guidelines and our immunocompromised population.   Etoposide, VP-16 injection What is this medication? ETOPOSIDE, VP-16 (e toe POE side) is a chemotherapy drug. It is used to treattesticular cancer, lung cancer, and other cancers. This medicine may be used for other purposes; ask your health care provider orpharmacist if you have questions. COMMON BRAND NAME(S): Etopophos, Toposar,  VePesid What should I tell my care team before I take this medication? They need to know if you have any of these conditions: infection kidney disease liver disease low blood counts, like low white cell, platelet, or red cell counts an unusual or allergic reaction to etoposide,  other medicines, foods, dyes, or preservatives pregnant or trying to get pregnant breast-feeding How should I use this medication? This medicine is for infusion into a vein. It is administered in a hospital orclinic by a specially trained health care professional. Talk to your pediatrician regarding the use of this medicine in children.Special care may be needed. Overdosage: If you think you have taken too much of this medicine contact apoison control center or emergency room at once. NOTE: This medicine is only for you. Do not share this medicine with others. What if I miss a dose? It is important not to miss your dose. Call your doctor or health careprofessional if you are unable to keep an appointment. What may interact with this medication? This medicine may interact with the following medications: warfarin This list may not describe all possible interactions. Give your health care provider a list of all the medicines, herbs, non-prescription drugs, or dietary supplements you use. Also tell them if you smoke, drink alcohol, or use illegaldrugs. Some items may interact with your medicine. What should I watch for while using this medication? Visit your doctor for checks on your progress. This drug may make you feel generally unwell. This is not uncommon, as chemotherapy can affect healthy cells as well as cancer cells. Report any side effects. Continue your course oftreatment even though you feel ill unless your doctor tells you to stop. In some cases, you may be given additional medicines to help with side effects.Follow all directions for their use. Call your doctor or health care professional for advice if you get a fever, chills or sore throat, or other symptoms of a cold or flu. Do not treat yourself. This drug decreases your body's ability to fight infections. Try toavoid being around people who are sick. This medicine may increase your risk to bruise or bleed. Call your doctor orhealth  care professional if you notice any unusual bleeding. Talk to your doctor about your risk of cancer. You may be more at risk forcertain types of cancers if you take this medicine. Do not become pregnant while taking this medicine or for at least 6 months after stopping it. Women should inform their doctor if they wish to become pregnant or think they might be pregnant. Women of child-bearing potential will need to have a negative pregnancy test before starting this medicine. There is a potential for serious side effects to an unborn child. Talk to your health care professional or pharmacist for more information. Do not breast-feed aninfant while taking this medicine. Men must use a latex condom during sexual contact with a woman while taking this medicine and for at least 4 months after stopping it. A latex condom is needed even if you have had a vasectomy. Contact your doctor right away if your partner becomes pregnant. Do not donate sperm while taking this medicine and for at least 4 months after you stop taking this medicine. Men should inform their doctors if they wish to father a child. This medicine may lower spermcounts. What side effects may I notice from receiving this medication? Side effects that you should report to your doctor or health care professionalas soon as possible: allergic reactions like skin rash, itching or  hives, swelling of the face, lips, or tongue low blood counts - this medicine may decrease the number of white blood cells, red blood cells, and platelets. You may be at increased risk for infections and bleeding nausea, vomiting redness, blistering, peeling or loosening of the skin, including inside the mouth signs and symptoms of infection like fever; chills; cough; sore throat; pain or trouble passing urine signs and symptoms of low red blood cells or anemia such as unusually weak or tired; feeling faint or lightheaded; falls; breathing problems unusual bruising or  bleeding Side effects that usually do not require medical attention (report to yourdoctor or health care professional if they continue or are bothersome): changes in taste diarrhea hair loss loss of appetite mouth sores This list may not describe all possible side effects. Call your doctor for medical advice about side effects. You may report side effects to FDA at1-800-FDA-1088. Where should I keep my medication? This drug is given in a hospital or clinic and will not be stored at home. NOTE: This sheet is a summary. It may not cover all possible information. If you have questions about this medicine, talk to your doctor, pharmacist, orhealth care provider.  2022 Elsevier/Gold Standard (2018-10-11 16:57:15)

## 2021-03-13 ENCOUNTER — Ambulatory Visit (INDEPENDENT_AMBULATORY_CARE_PROVIDER_SITE_OTHER): Payer: Self-pay | Admitting: Thoracic Surgery (Cardiothoracic Vascular Surgery)

## 2021-03-13 ENCOUNTER — Ambulatory Visit: Payer: Medicare Other | Admitting: Thoracic Surgery (Cardiothoracic Vascular Surgery)

## 2021-03-13 ENCOUNTER — Other Ambulatory Visit: Payer: Self-pay

## 2021-03-13 ENCOUNTER — Inpatient Hospital Stay: Payer: Medicare Other

## 2021-03-13 VITALS — BP 145/77 | HR 95 | Temp 98.7°F | Resp 18

## 2021-03-13 VITALS — BP 137/79 | HR 105 | Resp 20 | Ht 69.0 in | Wt 191.0 lb

## 2021-03-13 DIAGNOSIS — Z5111 Encounter for antineoplastic chemotherapy: Secondary | ICD-10-CM | POA: Diagnosis not present

## 2021-03-13 DIAGNOSIS — C7A1 Malignant poorly differentiated neuroendocrine tumors: Secondary | ICD-10-CM

## 2021-03-13 DIAGNOSIS — Z09 Encounter for follow-up examination after completed treatment for conditions other than malignant neoplasm: Secondary | ICD-10-CM

## 2021-03-13 MED ORDER — SODIUM CHLORIDE 0.9 % IV SOLN
10.0000 mg | Freq: Once | INTRAVENOUS | Status: AC
  Administered 2021-03-13: 10 mg via INTRAVENOUS
  Filled 2021-03-13: qty 1

## 2021-03-13 MED ORDER — SODIUM CHLORIDE 0.9 % IV SOLN
Freq: Once | INTRAVENOUS | Status: AC
Start: 2021-03-13 — End: 2021-03-13
  Filled 2021-03-13: qty 250

## 2021-03-13 MED ORDER — SODIUM CHLORIDE 0.9% FLUSH
10.0000 mL | INTRAVENOUS | Status: DC | PRN
  Administered 2021-03-13: 10 mL
  Filled 2021-03-13: qty 10

## 2021-03-13 MED ORDER — SODIUM CHLORIDE 0.9 % IV SOLN
120.0000 mg/m2 | Freq: Once | INTRAVENOUS | Status: AC
  Administered 2021-03-13: 250 mg via INTRAVENOUS
  Filled 2021-03-13: qty 12.5

## 2021-03-13 MED ORDER — HEPARIN SOD (PORK) LOCK FLUSH 100 UNIT/ML IV SOLN
500.0000 [IU] | Freq: Once | INTRAVENOUS | Status: AC | PRN
  Administered 2021-03-13: 500 [IU]
  Filled 2021-03-13: qty 5

## 2021-03-13 NOTE — Patient Instructions (Addendum)
On Monday, July 18th, 2022, you will receive a dose of Udenyca.  This helps to stimulate you bone marrow to produce white blood cells.  One side effect of the medication is bone pain.  Please take Claritin (brand name or generic on the day of the injection and for 3 days after injection to help with bone pain.  You can purchase this over the counter at any pharmacy.    [facility]  Discharge Instructions: Thank you for choosing [facility] to provide your oncology and hematology care.   If you have a lab appointment with the [facility], please go directly to the [facility] and check in at the registration area.   Wear comfortable clothing and clothing appropriate for easy access to any Portacath or PICC line.   We strive to give you quality time with your provider. You may need to reschedule your appointment if you arrive late (15 or more minutes).  Arriving late affects you and other patients whose appointments are after yours.  Also, if you miss three or more appointments without notifying the office, you may be dismissed from the clinic at the provider's discretion.      For prescription refill requests, have your pharmacy contact our office and allow 72 hours for refills to be completed.    Today you received the following chemotherapy and/or immunotherapy agents: etoposide.    To help prevent nausea and vomiting after your treatment, we encourage you to take your nausea medication as directed.  BELOW ARE SYMPTOMS THAT SHOULD BE REPORTED IMMEDIATELY: *FEVER GREATER THAN 100.4 F (38 C) OR HIGHER *CHILLS OR SWEATING *NAUSEA AND VOMITING THAT IS NOT CONTROLLED WITH YOUR NAUSEA MEDICATION *UNUSUAL SHORTNESS OF BREATH *UNUSUAL BRUISING OR BLEEDING *URINARY PROBLEMS (pain or burning when urinating, or frequent urination) *BOWEL PROBLEMS (unusual diarrhea, constipation, pain near the anus) TENDERNESS IN MOUTH AND THROAT WITH OR WITHOUT PRESENCE  OF ULCERS (sore throat, sores in mouth, or a toothache) UNUSUAL RASH, SWELLING OR PAIN  UNUSUAL VAGINAL DISCHARGE OR ITCHING   Items with * indicate a potential emergency and should be followed up as soon as possible or go to the Emergency Department if any problems should occur.  Please show the CHEMOTHERAPY ALERT CARD or IMMUNOTHERAPY ALERT CARD at check-in to the Emergency Department and triage nurse.  Should you have questions after your visit or need to cancel or reschedule your appointment, please contact [facility]  Dept: [phone]  and follow the prompts.  Office hours are 8:00 a.m. to 4:30 p.m. Monday - Friday. Please note that voicemails left after 4:00 p.m. may not be returned until the following business day.  We are closed weekends and major holidays. You have access to a nurse at all times for urgent questions. Please call the main number to the clinic Dept: [phone] and follow the prompts.   For any non-urgent questions, you may also contact your provider using MyChart. We now offer e-Visits for anyone 65 and older to request care online for non-urgent symptoms. For details visit [email or web address].   Also download the MyChart app! Go to the app store, search "MyChart", open the app, select [facility], and log in with your MyChart username and password.  Due to Covid, a mask is required upon entering the hospital/clinic. If you do not have a mask, one will be given to you upon arrival. For doctor visits, patients may have 1 support person aged 73 or  older with them. For treatment visits, patients cannot have anyone with them due to current Covid guidelines and our immunocompromised population.   Etoposide, VP-16 injection What is this medication? ETOPOSIDE, VP-16 (e toe POE side) is a chemotherapy drug. It is used to treattesticular cancer, lung cancer, and other cancers. This medicine may be used for other purposes; ask your health care  provider orpharmacist if you have questions. COMMON BRAND NAME(S): Etopophos, Toposar, VePesid What should I tell my care team before I take this medication? They need to know if you have any of these conditions: infection kidney disease liver disease low blood counts, like low white cell, platelet, or red cell counts an unusual or allergic reaction to etoposide, other medicines, foods, dyes, or preservatives pregnant or trying to get pregnant breast-feeding How should I use this medication? This medicine is for infusion into a vein. It is administered in a hospital orclinic by a specially trained health care professional. Talk to your pediatrician regarding the use of this medicine in children.Special care may be needed. Overdosage: If you think you have taken too much of this medicine contact apoison control center or emergency room at once. NOTE: This medicine is only for you. Do not share this medicine with others. What if I miss a dose? It is important not to miss your dose. Call your doctor or health careprofessional if you are unable to keep an appointment. What may interact with this medication? This medicine may interact with the following medications: warfarin This list may not describe all possible interactions. Give your health care provider a list of all the medicines, herbs, non-prescription drugs, or dietary supplements you use. Also tell them if you smoke, drink alcohol, or use illegaldrugs. Some items may interact with your medicine. What should I watch for while using this medication? Visit your doctor for checks on your progress. This drug may make you feel generally unwell. This is not uncommon, as chemotherapy can affect healthy cells as well as cancer cells. Report any side effects. Continue your course oftreatment even though you feel ill unless your doctor tells you to stop. In some cases, you may be given additional medicines to help with side effects.Follow all  directions for their use. Call your doctor or health care professional for advice if you get a fever, chills or sore throat, or other symptoms of a cold or flu. Do not treat yourself. This drug decreases your body's ability to fight infections. Try toavoid being around people who are sick. This medicine may increase your risk to bruise or bleed. Call your doctor orhealth care professional if you notice any unusual bleeding. Talk to your doctor about your risk of cancer. You may be more at risk forcertain types of cancers if you take this medicine. Do not become pregnant while taking this medicine or for at least 6 months after stopping it. Women should inform their doctor if they wish to become pregnant or think they might be pregnant. Women of child-bearing potential will need to have a negative pregnancy test before starting this medicine. There is a potential for serious side effects to an unborn child. Talk to your health care professional or pharmacist for more information. Do not breast-feed aninfant while taking this medicine. Men must use a latex condom during sexual contact with a woman while taking this medicine and for at least 4 months after stopping it. A latex condom is needed even if you have had a vasectomy. Contact your doctor right away if  your partner becomes pregnant. Do not donate sperm while taking this medicine and for at least 4 months after you stop taking this medicine. Men should inform their doctors if they wish to father a child. This medicine may lower spermcounts. What side effects may I notice from receiving this medication? Side effects that you should report to your doctor or health care professionalas soon as possible: allergic reactions like skin rash, itching or hives, swelling of the face, lips, or tongue low blood counts - this medicine may decrease the number of white blood cells, red blood cells, and platelets. You may be at increased risk for infections and  bleeding nausea, vomiting redness, blistering, peeling or loosening of the skin, including inside the mouth signs and symptoms of infection like fever; chills; cough; sore throat; pain or trouble passing urine signs and symptoms of low red blood cells or anemia such as unusually weak or tired; feeling faint or lightheaded; falls; breathing problems unusual bruising or bleeding Side effects that usually do not require medical attention (report to yourdoctor or health care professional if they continue or are bothersome): changes in taste diarrhea hair loss loss of appetite mouth sores This list may not describe all possible side effects. Call your doctor for medical advice about side effects. You may report side effects to FDA at1-800-FDA-1088. Where should I keep my medication? This drug is given in a hospital or clinic and will not be stored at home. NOTE: This sheet is a summary. It may not cover all possible information. If you have questions about this medicine, talk to your doctor, pharmacist, orhealth care provider.  2022 Elsevier/Gold Standard (2018-10-11 16:57:15)  Hiccups  A hiccup is the result of a sudden irritation of a muscle that is used for breathing (diaphragm). The diaphragm is located under your lungs and above your stomach. When the diaphragm gets irritated, it may quickly tighten without your control (have a spasm). The spasm causes you to quickly suck in air, and that causes your vocal cords to close together quickly. These reactions cause the hiccup sound. Hiccups usually last only a short amount of time (less than 48 hours). In unusual cases, they can last for days or months and require you to see yourhealth care provider. Common causes of hiccups include: Eating too fast or eating too much food. Drinking alcohol or bubbly (carbonated) drinks. Eating or drinking hot or spicy foods and drinks. Swallowing extra air when sucking on candy or a straw or when chewing on  gum. Feeling nervous, stressed, or excited. Having certain conditions that irritate the diaphragm nerves. Having metabolic or nervous system disorders. Follow these instructions at home: To prevent hiccups or lessen discomfort from hiccups: Eat and chew your food slowly. Eat small meals, and avoid overeating. If you drink alcohol: Limit how much you have to: 0-1 drink a day for women who are not pregnant. 0-2 drinks a day for men. Know how much alcohol is in a drink. In the [place] one drink equals one 12 oz bottle of beer (355 mL), one 5 oz glass of wine (148 mL), or one 1 oz glass of hard liquor (44 mL). Limit your drinking of carbonated or fizzy drinks, such as soda. Avoid eating or drinking hot or spicy foods and drinks. General instructions Watch for any changes in your hiccups. Take over-the-counter and prescription medicines only as told by your health care provider. Contact a health care provider if: Your hiccups last for more than 48 hours. Your hiccups  do not improve with treatment. You cannot sleep or eat because of your hiccups. You have unexpected weight loss because of your hiccups. You have numbness, tingling, or weakness. Get help right away if: You have trouble breathing or swallowing. You have severe pain in your abdomen. These symptoms may represent a serious problem that is an emergency. Do not wait to see if the symptoms will go away. Get medical help right away. Call your local emergency services (911 in the [place] Do not drive yourself to the hospital. Summary A hiccup is the result of a sudden irritation of a muscle that is used for breathing (diaphragm). Hiccups can be caused by many things, including eating too fast. Call your health care provider if your hiccups last for more than 48 hours. This information is not intended to replace advice given to you by your health care provider. Make sure you discuss any questions you have with your healthcare  provider. Document Revised: 04/18/2020 Document Reviewed: 04/18/2020 Elsevier Patient Education  [place].

## 2021-03-14 ENCOUNTER — Encounter: Payer: Self-pay | Admitting: Thoracic Surgery (Cardiothoracic Vascular Surgery)

## 2021-03-14 NOTE — Progress Notes (Signed)
      [address]       [place] [address]             [phone]        [name] [place] Medical Record #[id number] Date of Birth: 02-03-1956  Referring: Garner Nash, DO Primary Care: [name], MD Primary Cardiologist:Anthony Irish Lack, MD  Reason for visit:   follow-up  History of Present Illness:     Overall [name] is doing well.  He has started his chemotherapy once every 3 weeks.  He is unclear on a number of cycles possibly 4-6.  He did have a very bad leukopenic reaction with the first dose of chemotherapy so the second cycle has been delayed.  From a respiratory standpoint he continues to do well.  He only has shortness of breath after receiving chemotherapy.  Physical Exam: BP 137/79   Pulse (!) 105   Resp 20   Ht 5\' 9"  (1.753 m)   Wt 191 lb (86.6 kg)   SpO2 93% Comment: RA  BMI 28.21 kg/m   Alert NAD Mildly tachycardic. Easy work of breathing.     Assessment / Plan:   65 year old male with stage IV non-small cell lung cancer, of neuroendocrine origin.  He underwent a lobectomy which was complicated by prolonged air leak requiring endobronchial valves.  He has at the point where we can consider removing the valves but I do not want to interrupt his chemotherapy.  I will follow-up with him in 1 month to assess where he is with his therapy sessions.   [name] 03/14/2021 10:33 AM

## 2021-03-16 ENCOUNTER — Ambulatory Visit
Admission: RE | Admit: 2021-03-16 | Discharge: 2021-03-16 | Disposition: A | Discharge status: Home / Self Care | Payer: Medicare Other | Source: Ambulatory Visit | Attending: Radiation Oncology | Admitting: Radiation Oncology

## 2021-03-16 ENCOUNTER — Other Ambulatory Visit: Payer: Self-pay | Admitting: Medical Oncology

## 2021-03-16 ENCOUNTER — Encounter: Payer: Self-pay | Admitting: Internal Medicine

## 2021-03-16 ENCOUNTER — Other Ambulatory Visit: Payer: Self-pay

## 2021-03-16 ENCOUNTER — Inpatient Hospital Stay: Payer: Medicare Other

## 2021-03-16 ENCOUNTER — Inpatient Hospital Stay (HOSPITAL_BASED_OUTPATIENT_CLINIC_OR_DEPARTMENT_OTHER): Payer: Medicare Other | Admitting: Internal Medicine

## 2021-03-16 VITALS — HR 88

## 2021-03-16 VITALS — BP 129/66 | HR 120 | Temp 98.1°F | Resp 20 | Ht 69.0 in | Wt 180.6 lb

## 2021-03-16 DIAGNOSIS — C3491 Malignant neoplasm of unspecified part of right bronchus or lung: Secondary | ICD-10-CM

## 2021-03-16 DIAGNOSIS — C7A1 Malignant poorly differentiated neuroendocrine tumors: Secondary | ICD-10-CM

## 2021-03-16 DIAGNOSIS — Z95828 Presence of other vascular implants and grafts: Secondary | ICD-10-CM

## 2021-03-16 DIAGNOSIS — Z5111 Encounter for antineoplastic chemotherapy: Secondary | ICD-10-CM

## 2021-03-16 LAB — CBC WITH DIFFERENTIAL (CANCER CENTER ONLY)
Abs Immature Granulocytes: 0.05 10*3/uL (ref 0.00–0.07)
Basophils Absolute: 0 10*3/uL (ref 0.0–0.1)
Basophils Relative: 0 %
Eosinophils Absolute: 0.2 10*3/uL (ref 0.0–0.5)
Eosinophils Relative: 1 %
HCT: 33.8 % — ABNORMAL LOW (ref 39.0–52.0)
Hemoglobin: 11.6 g/dL — ABNORMAL LOW (ref 13.0–17.0)
Immature Granulocytes: 0 %
Lymphocytes Relative: 11 %
Lymphs Abs: 1.2 10*3/uL (ref 0.7–4.0)
MCH: 29.9 pg (ref 26.0–34.0)
MCHC: 34.3 g/dL (ref 30.0–36.0)
MCV: 87.1 fL (ref 80.0–100.0)
Monocytes Absolute: 0.1 10*3/uL (ref 0.1–1.0)
Monocytes Relative: 1 %
Neutro Abs: 10.2 10*3/uL — ABNORMAL HIGH (ref 1.7–7.7)
Neutrophils Relative %: 87 %
Platelet Count: 333 10*3/uL (ref 150–400)
RBC: 3.88 MIL/uL — ABNORMAL LOW (ref 4.22–5.81)
RDW: 12.8 % (ref 11.5–15.5)
WBC Count: 11.8 10*3/uL — ABNORMAL HIGH (ref 4.0–10.5)
nRBC: 0 % (ref 0.0–0.2)

## 2021-03-16 LAB — CMP (CANCER CENTER ONLY)
ALT: 16 U/L (ref 0–44)
AST: 14 U/L — ABNORMAL LOW (ref 15–41)
Albumin: 3.7 g/dL (ref 3.5–5.0)
Alkaline Phosphatase: 80 U/L (ref 38–126)
Anion gap: 11 (ref 5–15)
BUN: 31 mg/dL — ABNORMAL HIGH (ref 8–23)
CO2: 23 mmol/L (ref 22–32)
Calcium: 9.7 mg/dL (ref 8.9–10.3)
Chloride: 102 mmol/L (ref 98–111)
Creatinine: 1.19 mg/dL (ref 0.61–1.24)
GFR, Estimated: >60 mL/min (ref >60)
Glucose, Bld: 110 mg/dL — ABNORMAL HIGH (ref 70–99)
Potassium: 4.3 mmol/L (ref 3.5–5.1)
Sodium: 136 mmol/L (ref 135–145)
Total Bilirubin: 1.1 mg/dL (ref 0.3–1.2)
Total Protein: 7.2 g/dL (ref 6.5–8.1)

## 2021-03-16 LAB — MAGNESIUM: Magnesium: 1.8 mg/dL (ref 1.7–2.4)

## 2021-03-16 MED ORDER — ONDANSETRON HCL 4 MG/2ML IJ SOLN
INTRAMUSCULAR | Status: AC
  Filled 2021-03-16: qty 4

## 2021-03-16 MED ORDER — HEPARIN SOD (PORK) LOCK FLUSH 100 UNIT/ML IV SOLN
500.0000 [IU] | Freq: Once | INTRAVENOUS | Status: AC
  Administered 2021-03-16: 500 [IU] via INTRAVENOUS
  Filled 2021-03-16: qty 5

## 2021-03-16 MED ORDER — SODIUM CHLORIDE 0.9% FLUSH
10.0000 mL | Freq: Once | INTRAVENOUS | Status: AC
  Administered 2021-03-16: 10 mL via INTRAVENOUS
  Filled 2021-03-16: qty 10

## 2021-03-16 MED ORDER — PEGFILGRASTIM-CBQV 6 MG/0.6ML ~~LOC~~ SOSY
6.0000 mg | PREFILLED_SYRINGE | Freq: Once | SUBCUTANEOUS | Status: AC
  Administered 2021-03-16: 6 mg via SUBCUTANEOUS

## 2021-03-16 MED ORDER — SODIUM CHLORIDE 0.9 % IV SOLN: 8.0000 mg | Freq: Once | INTRAVENOUS | Status: DC

## 2021-03-16 MED ORDER — SODIUM CHLORIDE 0.9 % IV SOLN
INTRAVENOUS | Status: DC
  Filled 2021-03-16 (×2): qty 250

## 2021-03-16 MED ORDER — HEPARIN SOD (PORK) LOCK FLUSH 100 UNIT/ML IV SOLN
500.0000 [IU] | Freq: Once | INTRAVENOUS | Status: AC
Start: 2021-03-16 — End: 2021-03-16
  Administered 2021-03-16: 500 [IU] via INTRAVENOUS
  Filled 2021-03-16: qty 5

## 2021-03-16 MED ORDER — ONDANSETRON HCL 4 MG/2ML IJ SOLN
8.0000 mg | Freq: Once | INTRAMUSCULAR | Status: AC
  Administered 2021-03-16: 8 mg via INTRAVENOUS

## 2021-03-16 MED ORDER — PEGFILGRASTIM-CBQV 6 MG/0.6ML ~~LOC~~ SOSY
PREFILLED_SYRINGE | SUBCUTANEOUS | Status: AC
  Filled 2021-03-16: qty 0.6

## 2021-03-16 MED ORDER — SODIUM CHLORIDE 0.9% FLUSH
10.0000 mL | INTRAVENOUS | Status: DC | PRN
  Administered 2021-03-16: 10 mL via INTRAVENOUS
  Filled 2021-03-16: qty 10

## 2021-03-16 NOTE — Patient Instructions (Addendum)
Rehydration, Adult Rehydration is the replacement of body fluids, salts, and minerals (electrolytes) that are lost during dehydration. Dehydration is when there is not enough water or other fluids in the body. This happens when you lose more fluids than you take in. Common causes of dehydration include: Not drinking enough fluids. This can occur when you are ill or doing activities that require a lot of energy, especially in hot weather. Conditions that cause loss of water or other fluids, such as diarrhea, vomiting, sweating, or urinating a lot. Other illnesses, such as fever or infection. Certain medicines, such as those that remove excess fluid from the body (diuretics). Symptoms of mild or moderate dehydration may include thirst, dry lips and mouth, and dizziness. Symptoms of severe dehydration may include increasedheart rate, confusion, fainting, and not urinating. For severe dehydration, you may need to get fluids through an IV at the hospital. For mild or moderate dehydration, you can usually rehydrate at homeby drinking certain fluids as told by your health care provider. What are the risks? Generally, rehydration is safe. However, taking in too much fluid (overhydration) can be a problem. This is rare. Overhydration can cause an electrolyte imbalance, kidney failure, or a decrease in salt (sodium) levels in the body. Supplies needed You will need an oral rehydration solution (ORS) if your health care provider tells you to use one. This is a drink to treat dehydration. It can be found inpharmacies and retail stores. How to rehydrate Fluids Follow instructions from your health care provider for rehydration. The kind of fluid and the amount you should drink depend on your condition. In general, you should choose drinks that you prefer. If told by your health care provider, drink an ORS. Make an ORS by following instructions on the package. Start by drinking small amounts, about  cup (120 mL)  every 5-10 minutes. Slowly increase how much you drink until you have taken the amount recommended by your health care provider. Drink enough clear fluids to keep your urine pale yellow. If you were told to drink an ORS, finish it first, then start slowly drinking other clear fluids. Drink fluids such as: Water. This includes sparkling water and flavored water. Drinking only water can lead to having too little sodium in your body (hyponatremia). Follow the advice of your health care provider. Water from ice chips you suck on. Fruit juice with water you add to it (diluted). Sports drinks. Hot or cold herbal teas. Broth-based soups. Milk or milk products. Food Follow instructions from your health care provider about what to eat while you rehydrate. Your health care provider may recommend that you slowly begin eating regular foods in small amounts. Eat foods that contain a healthy balance of electrolytes, such as bananas, oranges, potatoes, tomatoes, and spinach. Avoid foods that are greasy or contain a lot of sugar. In some cases, you may get nutrition through a feeding tube that is passed through your nose and into your stomach (nasogastric tube, or NG tube). This may be done if you have uncontrolled vomiting or diarrhea. Beverages to avoid  Certain beverages may make dehydration worse. While you rehydrate, avoiddrinking alcohol. How to tell if you are recovering from dehydration You may be recovering from dehydration if: You are urinating more often than before you started rehydrating. Your urine is pale yellow. Your energy level improves. You vomit less frequently. You have diarrhea less frequently. Your appetite improves or returns to normal. You feel less dizzy or less light-headed. Your skin tone and  color start to look more normal. Follow these instructions at home: Take over-the-counter and prescription medicines only as told by your health care provider. Do not take sodium  tablets. Doing this can lead to having too much sodium in your body (hypernatremia). Contact a health care provider if: You continue to have symptoms of mild or moderate dehydration, such as: Thirst. Dry lips. Slightly dry mouth. Dizziness. Dark urine or less urine than normal. Muscle cramps. You continue to vomit or have diarrhea. Get help right away if you: Have symptoms of dehydration that get worse. Have a fever. Have a severe headache. Have been vomiting and the following happens: Your vomiting gets worse or does not go away. Your vomit includes blood or green matter (bile). You cannot eat or drink without vomiting. Have problems with urination or bowel movements, such as: Diarrhea that gets worse or does not go away. Blood in your stool (feces). This may cause stool to look black and tarry. Not urinating, or urinating only a small amount of very dark urine, within 6-8 hours. Have trouble breathing. Have symptoms that get worse with treatment. These symptoms may represent a serious problem that is an emergency. Do not wait to see if the symptoms will go away. Get medical help right away. Call your local emergency services (911 in the [place] Do not drive yourself to the hospital. Summary Rehydration is the replacement of body fluids and minerals (electrolytes) that are lost during dehydration. Follow instructions from your health care provider for rehydration. The kind of fluid and amount you should drink depend on your condition. Slowly increase how much you drink until you have taken the amount recommended by your health care provider. Contact your health care provider if you continue to show signs of mild or moderate dehydration. This information is not intended to replace advice given to you by your health care provider. Make sure you discuss any questions you have with your healthcare provider. Document Revised: 10/17/2019 Document Reviewed: 08/27/2019 Elsevier Patient  Education  [employer].  Pegfilgrastim injection What is this medication? PEGFILGRASTIM (PEG fil gra stim) is a long-acting granulocyte colony-stimulating factor that stimulates the growth of neutrophils, a type of white blood cell important in the body's fight against infection. It is used to reduce the incidence of fever and infection in patients with certain types of cancer who are receiving chemotherapy that affects the bone marrow, and toincrease survival after being exposed to high doses of radiation. This medicine may be used for other purposes; ask your health care provider orpharmacist if you have questions. COMMON BRAND NAME(S): Rexene Edison, Ziextenzo What should I tell my care team before I take this medication? They need to know if you have any of these conditions: kidney disease latex allergy ongoing radiation therapy sickle cell disease skin reactions to acrylic adhesives (On-Body Injector only) an unusual or allergic reaction to pegfilgrastim, filgrastim, other medicines, foods, dyes, or preservatives pregnant or trying to get pregnant breast-feeding How should I use this medication? This medicine is for injection under the skin. If you get this medicine at home, you will be taught how to prepare and give the pre-filled syringe or how to use the On-body Injector. Refer to the patient Instructions for Use for detailed instructions. Use exactly as directed. Tell your healthcare provider immediately if you suspect that the On-body Injector may not have performed as intended or if you suspect the use of the On-body Injector resulted in a missedor partial dose.  It is important that you put your used needles and syringes in a special sharps container. Do not put them in a trash can. If you do not have a sharpscontainer, call your pharmacist or healthcare provider to get one. Talk to your pediatrician regarding the use of this medicine in children.  Whilethis drug may be prescribed for selected conditions, precautions do apply. Overdosage: If you think you have taken too much of this medicine contact apoison control center or emergency room at once. NOTE: This medicine is only for you. Do not share this medicine with others. What if I miss a dose? It is important not to miss your dose. Call your doctor or health care professional if you miss your dose. If you miss a dose due to an On-body Injector failure or leakage, a new dose should be administered as soon aspossible using a single prefilled syringe for manual use. What may interact with this medication? Interactions have not been studied. This list may not describe all possible interactions. Give your health care provider a list of all the medicines, herbs, non-prescription drugs, or dietary supplements you use. Also tell them if you smoke, drink alcohol, or use illegaldrugs. Some items may interact with your medicine. What should I watch for while using this medication? Your condition will be monitored carefully while you are receiving thismedicine. You may need blood work done while you are taking this medicine. Talk to your health care provider about your risk of cancer. You may be more atrisk for certain types of cancer if you take this medicine. If you are going to need a MRI, CT scan, or other procedure, tell your doctorthat you are using this medicine (On-Body Injector only). What side effects may I notice from receiving this medication? Side effects that you should report to your doctor or health care professionalas soon as possible: allergic reactions (skin rash, itching or hives, swelling of the face, lips, or tongue) back pain dizziness fever pain, redness, or irritation at site where injected pinpoint red spots on the skin red or dark-brown urine shortness of breath or breathing problems stomach or side pain, or pain at the shoulder swelling tiredness trouble passing  urine or change in the amount of urine unusual bruising or bleeding Side effects that usually do not require medical attention (report to yourdoctor or health care professional if they continue or are bothersome): bone pain muscle pain This list may not describe all possible side effects. Call your doctor for medical advice about side effects. You may report side effects to FDA at1-800-FDA-1088. Where should I keep my medication? Keep out of the reach of children. If you are using this medicine at home, you will be instructed on how to storeit. Throw away any unused medicine after the expiration date on the label. NOTE: This sheet is a summary. It may not cover all possible information. If you have questions about this medicine, talk to your doctor, pharmacist, orhealth care provider.  2022 Elsevier/[employer] Standard (2020-09-12 11:54:14)  Ondansetron Injection What is this medication? ONDANSETRON (on DAN se tron) prevents nausea and vomiting from chemotherapy, radiation, or surgery. It works by blocking substances in the body that may cause nausea or vomiting. It belongs to a groupd of medications calledantiemetics. This medicine may be used for other purposes; ask your health care provider orpharmacist if you have questions. COMMON BRAND NAME(S): Zofran What should I tell my care team before I take this medication? They need to know if you have any of  these conditions: Heart disease History of irregular heartbeat Liver disease Low levels of magnesium or potassium in the blood An unusual or allergic reaction to ondansetron, granisetron, other medications, foods, dyes, or preservatives Pregnant or trying to get pregnant Breast-feeding How should I use this medication? This medication is for infusion into a vein. It is given in a hospital orclinic. Talk to your care team regarding the use of this medication in children.Special care may be needed. Overdosage: If you think you have taken too  much of this medicine contact apoison control center or emergency room at once. NOTE: This medicine is only for you. Do not share this medicine with others. What if I miss a dose? This does not apply. What may interact with this medication? Do not take this medication with any of the following: Apomorphine Certain medications for fungal infections like fluconazole, itraconazole, ketoconazole, posaconazole, voriconazole Cisapride Dronedarone Pimozide Thioridazine This medication may also interact with the following: Carbamazepine Certain medications for depression, anxiety, or psychotic disturbances Fentanyl Linezolid MAOIs like Carbex, Eldepryl, Marplan, Nardil, and Parnate Methylene blue (injected into a vein) Other medications that prolong the QT interval (cause an abnormal heart rhythm) like dofetilide, ziprasidone Phenytoin Rifampicin Tramadol This list may not describe all possible interactions. Give your health care provider a list of all the medicines, herbs, non-prescription drugs, or dietary supplements you use. Also tell them if you smoke, drink alcohol, or use illegaldrugs. Some items may interact with your medicine. What should I watch for while using this medication? Your condition will be monitored carefully while you are receiving thismedication. What side effects may I notice from receiving this medication? Side effects that you should report to your care team as soon as possible: Allergic reactions-skin rash, itching, hives, swelling of the face, lips, tongue, or throat Bowel blockage-stomach cramping, unable to have a bowel movement or pass gas, loss of appetite, vomiting Chest pain (angina)-pain, pressure, or tightness in the chest, neck, back, or arms Heart rhythm changes-fast or irregular heartbeat, dizziness, feeling faint or lightheaded, chest pain, trouble breathing Irritability, confusion, fast or irregular heartbeat, muscle stiffness, twitching muscles,  sweating, high fever, seizure, chills, vomiting, diarrhea, which may be signs of serotonin syndrome Side effects that usually do not require medical attention (report to your careteam if they continue or are bothersome): Constipation Diarrhea General discomfort and fatigue Headache This list may not describe all possible side effects. Call your doctor for medical advice about side effects. You may report side effects to FDA at1-800-FDA-1088. Where should I keep my medication? This medication is given in a hospital or clinic and will not be stored at home. NOTE: This sheet is a summary. It may not cover all possible information. If you have questions about this medicine, talk to your doctor, pharmacist, orhealth care provider.  2022 Elsevier/Gold Standard (2020-09-05 14:19:15)

## 2021-03-16 NOTE — Progress Notes (Signed)
Went to get patient for flush and lab draw from port. Pt stood up to walk back to flush room and looked very pale and weak. Went and got pt a wheelchair and asked him about the symptoms he was feeling. Pt stated that he has been feeling very weak and lightheaded over the last few weeks on and off.  Patient has not had much of an appetite either. Proceeded to access patients port and draw his labs. Dr [name] made aware of patients symptoms and made aware that patient would like to speak with him regarding concerns of how he's been feeling as well as his treatment plan. Pt currently waiting in blue waiting room in wheelchair for Dr [name] to come assess him further. Pt also left accessed just incase he needs fluids or further treatment today. Dr [name] also made aware that patient is still accessed.

## 2021-03-16 NOTE — Progress Notes (Signed)
[facility] [phone]   [phone]  OFFICE PROGRESS NOTE  Anthony Melter, MD [name] [address]  DIAGNOSIS:  Stage IV (pT1c, N1, M1b) large cell neuroendocrine carcinoma.  He presented with a right upper lobe lung nodule and hilar lymphadenopathy.  He was diagnosed in April 2022. He also had a small metastatic brain lesion.    PDL1: 0%   PRIOR THERAPY: Robotic assisted thoracic surgery with a right upper lobectomy which was performed on 12/24/2020.     CURRENT THERAPY: Adjuvant chemotherapy with cisplatin 80 mg/m2 on day 1, etoposide 100 mg per metered squared on days 1, 2, and 3, IV every 3 weeks. First dose on 02/09/21.  Status post 2 cycles.  INTERVAL HISTORY: [name] 65 y.o. male was seen in the clinic today based on his request.  The [name] continues to complain of fatigue and weakness after his chemotherapy.  He is also tachycardic secondary to lack of oral intake and dehydration.  He continues to have mild pain on the right side of the chest with shortness of breath with exertion and mild cough with no hemoptysis.  He denied having any fever or chills.  He has occasional nausea and he needs a refill of Zofran.  He denied having any abdominal pain, diarrhea or constipation.  He is here today for Port-A-Cath flush as well as Neulasta injection.  MEDICAL HISTORY: Past Medical History:  Diagnosis Date   Anxiety    Arthritis    Basal cell carcinoma (BCC) of forehead    CAD (coronary artery disease)    a. 10/2015 ant STEMI >> LHC with 3 v CAD; oLAD tx with POBA >> emergent CABG. b. Multiple evals since that time, early graft failure of SVG-RCA by cath 03/2016. c. 2/19 PCI/DES x1 to pRCA, normal EF.   Carotid artery disease ([place])    a. 40-59% BICA 02/2018.   Depression    Dyspnea    Ectopic atrial tachycardia (HCC)    Esophageal reflux    eosinophil esophagitis   Family history of adverse reaction to  anesthesia    "sister has PONV" (06/21/2017)   Former tobacco use    Gout    Hepatitis C    "treated and cured" (06/21/2017)   High cholesterol    History of kidney stones    Hypertension    Ischemic cardiomyopathy    a. EF 25-30% at intraop TEE 4/17  //  b. Limited Echo 5/17 - EF 45-50%, mild ant HK. c. EF 55-65% by cath 09/2017.   Migraine    "3-4/yr" (06/21/2017)   Myocardial infarction ([place]) 10/2015   Palpitations    Sinus bradycardia    a. HR dropping into 40s in 02/2016 -> BB reduced.   Stroke [facility]) 10/2016   "small one; sometimes my memory/cognitive issues" (06/21/2017)   Symptomatic hypotension    a. 02/2016 ER visit -> meds reduced.   Syncope    Wears dentures    Wears glasses     ALLERGIES:  is allergic to prednisone, tetanus toxoids, wellbutrin [bupropion], indomethacin, other, scallops [shellfish allergy], and varenicline.  MEDICATIONS:  Current Outpatient Medications  Medication Sig Dispense Refill   acetaminophen (TYLENOL) 500 MG tablet Take 1,000 mg by mouth every 6 (six) hours as needed for moderate pain.     albuterol (VENTOLIN HFA) 108 (90 Base) MCG/ACT inhaler Inhale 2 puffs into the lungs every 6 (six) hours as needed for  wheezing or shortness of breath. 8 g 2   amiodarone (PACERONE) 200 MG tablet Take 1 tablet (200 mg total) by mouth daily. 30 tablet 1   amLODipine (NORVASC) 10 MG tablet TAKE 1 TABLET(10 MG) BY MOUTH DAILY 90 tablet 1   aspirin EC 81 MG tablet Take 81 mg by mouth daily. Swallow whole.     atorvastatin (LIPITOR) 80 MG tablet TAKE 1 TABLET(80 MG) BY MOUTH DAILY 90 tablet 2   clopidogrel (PLAVIX) 75 MG tablet TAKE 1 TABLET BY MOUTH EVERY DAY ([name] not taking: Reported on 03/13/2021) 90 tablet 1   fluticasone (FLONASE) 50 MCG/ACT nasal spray Place 2 sprays into both nostrils daily as needed for allergies.     lidocaine-prilocaine (EMLA) cream Apply 1 application topically once as needed (port access).     magnesium oxide (MAG-OX) 400 (240 Mg)  MG tablet Take 1 tablet (400 mg total) by mouth daily. 30 tablet 1   methocarbamol (ROBAXIN) 500 MG tablet Take 1 tablet (500 mg total) by mouth every 6 (six) hours as needed for muscle spasms. 30 tablet 0   metoprolol succinate (TOPROL-XL) 25 MG 24 hr tablet Take 1 tablet (25 mg total) by mouth daily. 90 tablet 3   Multiple Vitamin (MULTIVITAMIN WITH MINERALS) TABS tablet Take 1 tablet by mouth daily. Centrum     nitroGLYCERIN (NITROSTAT) 0.4 MG SL tablet PLACE 1 TABLET UNDER THE TONGUE EVERY 5 MINUTES AS NEEDED FOR CHEST PAIN. 3 DOSES MAX 25 tablet 4   ondansetron (ZOFRAN) 8 MG tablet Take 1 tablet (8 mg total) by mouth every 8 (eight) hours as needed for nausea or vomiting. Starting 3 days after chemotherapy 30 tablet 2   oxyCODONE-acetaminophen (PERCOCET/ROXICET) 5-325 MG tablet Take 1 tablet by mouth every 4 (four) hours as needed for severe pain. 30 tablet 0   polyethylene glycol (MIRALAX / GLYCOLAX) 17 g packet Take 17 g by mouth daily as needed. ([name] not taking: Reported on 03/03/2021) 14 each 0   prochlorperazine (COMPAZINE) 10 MG tablet Take 1 tablet (10 mg total) by mouth every 6 (six) hours as needed. 30 tablet 2   ranolazine (RANEXA) 1000 MG SR tablet Take 1 tablet (1,000 mg total) by mouth 2 (two) times daily. 180 tablet 0   Tiotropium Bromide-Olodaterol (STIOLTO RESPIMAT) 2.5-2.5 MCG/ACT AERS Inhale 2 puffs into the lungs daily. 4 g 3   No current facility-administered medications for this visit.    SURGICAL HISTORY:  Past Surgical History:  Procedure Laterality Date   25 GAUGE PARS PLANA VITRECTOMY WITH 20 GAUGE MVR PORT  12/31/2020   Procedure: 25 GAUGE PARS PLANA VITRECTOMY WITH 20 GAUGE MVR PORT;  Surgeon: [name], MD;  [facility]: [facility];  Service: Thoracic;;   ANTERIOR CERVICAL DECOMP/DISCECTOMY FUSION N/A 10/17/2018   Procedure: Anterior Cervical Decompression Fusion - Cervical seven -Thoracic one;  Surgeon: Consuella Lose, MD;  Location: [place];  Service:  Neurosurgery;  Laterality: N/A;   BASAL CELL CARCINOMA EXCISION     "forehead   BIOPSY  07/20/2019   Procedure: BIOPSY;  Surgeon: Carol Ada, MD;  Location: [facility];  Service: Endoscopy;;   BRONCHIAL BIOPSY  12/24/2020   Procedure: BRONCHIAL BIOPSIES;  Surgeon: Garner Nash, DO;  Location: [place] [facility];  Service: Pulmonary;;   BRONCHIAL BRUSHINGS  12/24/2020   Procedure: BRONCHIAL BRUSHINGS;  Surgeon: Garner Nash, DO;  Location: [place];  Service: Pulmonary;;   BRONCHIAL NEEDLE ASPIRATION BIOPSY  12/24/2020   Procedure: BRONCHIAL NEEDLE ASPIRATION BIOPSIES;  Surgeon: Garner Nash, DO;  Location: [facility];  Service: Pulmonary;;   CARDIAC CATHETERIZATION N/A 11/28/2015   Procedure: Left Heart Cath and Coronary Angiography;  Surgeon: [name], MD;  Location: [place] [facility];  Service: Cardiovascular;  Laterality: N/A;   CARDIAC CATHETERIZATION N/A 11/28/2015   Procedure: Coronary Balloon Angioplasty;  Surgeon: [name], MD;  Location: [place] [facility];  Service: Cardiovascular;  Laterality: N/A;  ostial LAD   CARDIAC CATHETERIZATION N/A 11/28/2015   Procedure: Coronary/Graft Angiography;  Surgeon: [name], MD;  Location: [place] [facility];  Service: Cardiovascular;  Laterality: N/A;  coronaries only    CARDIAC CATHETERIZATION N/A 04/21/2016   Procedure: Left Heart Cath and Coronary Angiography;  Surgeon: Wellington Hampshire, MD;  Location: [place] [facility];  Service: Cardiovascular;  Laterality: N/A;   CARDIAC CATHETERIZATION N/A 06/14/2016   Procedure: Left Heart Cath and Cors/Grafts Angiography;  Surgeon: Lorretta Harp, MD;  Location: [place] [facility];  Service: Cardiovascular;  Laterality: N/A;   CARDIAC CATHETERIZATION N/A 09/08/2016   Procedure: Left Heart Cath and Cors/Grafts Angiography;  Surgeon: Wellington Hampshire, MD;  Location: [place] [facility];  Service: Cardiovascular;  Laterality: N/A;   CARDIAC CATHETERIZATION      CORONARY ARTERY BYPASS GRAFT N/A 11/28/2015   Procedure: CORONARY ARTERY BYPASS GRAFTING (CABG) TIMES FIVE USING LEFT INTERNAL MAMMARY ARTERY AND RIGHT GREATER SAPHENOUS,VIEN HARVEATED BY ENDOVIEN, INTRAOPPRATIVE TEE;  Surgeon: Gaye Pollack, MD;  Location: [place];  Service: Open Heart Surgery;  Laterality: N/A;   CORONARY STENT INTERVENTION N/A 10/05/2017   Procedure: CORONARY STENT INTERVENTION;  Surgeon: [name], MD;  Location: [place] [facility];  Service: Cardiovascular;  Laterality: N/A;   ESOPHAGOGASTRODUODENOSCOPY (EGD) WITH PROPOFOL N/A 07/20/2019   Procedure: ESOPHAGOGASTRODUODENOSCOPY (EGD) WITH PROPOFOL;  Surgeon: Carol Ada, MD;  [facility];  Service: Endoscopy;  Laterality: N/A;   ESOPHAGOGASTRODUODENOSCOPY (EGD) WITH PROPOFOL N/A 01/30/2021   Procedure: ESOPHAGOGASTRODUODENOSCOPY (EGD) WITH PROPOFOL;  Surgeon: Carol Ada, MD;  [facility];  Service: Endoscopy;  Laterality: N/A;   FIDUCIAL MARKER PLACEMENT  12/24/2020   Procedure: FIDUCIAL DYE MARKER PLACEMENT;  Surgeon: Garner Nash, DO;  Location: [facility];  Service: Pulmonary;;   HUMERUS SURGERY Right 1969   "tumor inside bone; filled it w/bone chips"   INTERCOSTAL NERVE BLOCK Right 12/24/2020   Procedure: INTERCOSTAL NERVE BLOCK;  Surgeon: [name], MD;  Location: [place];  Service: Thoracic;  Laterality: Right;   IR IMAGING GUIDED PORT INSERTION  02/06/2021   LEFT HEART CATH AND CORS/GRAFTS ANGIOGRAPHY N/A 03/11/2017   Procedure: Left Heart Cath and Cors/Grafts Angiography;  Surgeon: Leonie Man, MD;  Location: [place] [facility];  Service: Cardiovascular;  Laterality: N/A;   LEFT HEART CATH AND CORS/GRAFTS ANGIOGRAPHY N/A 10/05/2017   Procedure: LEFT HEART CATH AND CORS/GRAFTS ANGIOGRAPHY;  Surgeon: [name], MD;  Location: [place] [facility];  Service: Cardiovascular;  Laterality: N/A;   LEFT HEART CATH AND CORS/GRAFTS ANGIOGRAPHY N/A 04/11/2019   Procedure:  LEFT HEART CATH AND CORS/GRAFTS ANGIOGRAPHY;  Surgeon: [name], MD;  Location: [place] [facility];  Service: Cardiovascular;  Laterality: N/A;   NODE DISSECTION Right 12/24/2020   Procedure: NODE DISSECTION;  Surgeon: [name], MD;  Location: [place];  Service: Thoracic;  Laterality: Right;   PERIPHERAL VASCULAR CATHETERIZATION N/A 06/14/2016   Procedure: Lower Extremity Angiography;  Surgeon: Lorretta Harp, MD;  Location: [place]  [facility];  Service: Cardiovascular;  Laterality: N/A;   VIDEO BRONCHOSCOPY WITH ENDOBRONCHIAL NAVIGATION Right 12/24/2020   Procedure: VIDEO BRONCHOSCOPY WITH ENDOBRONCHIAL NAVIGATION;  Surgeon: Garner Nash, DO;  Location: [place];  Service: Pulmonary;  Laterality: Right;   VIDEO BRONCHOSCOPY WITH ENDOBRONCHIAL ULTRASOUND N/A 12/24/2020   Procedure: VIDEO BRONCHOSCOPY WITH ENDOBRONCHIAL ULTRASOUND;  Surgeon: Garner Nash, DO;  Location: [place];  Service: Pulmonary;  Laterality: N/A;   VIDEO BRONCHOSCOPY WITH INSERTION OF INTERBRONCHIAL VALVE (IBV) N/A 12/31/2020   Procedure: VIDEO BRONCHOSCOPY WITH INSERTION OF INTERBRONCHIAL VALVE (IBV).VALVE IN CARTRIDGE 76mm,9mm. CHEST TUBE PLACEMENT.;  Surgeon: [name], MD;  [facility];  Service: Thoracic;  Laterality: N/A;    REVIEW OF SYSTEMS:  A comprehensive review of systems was negative except for: Constitutional: positive for anorexia, fatigue, and weight loss Respiratory: positive for dyspnea on exertion and pleurisy/chest pain Cardiovascular: positive for tachycardia    PHYSICAL EXAMINATION: General appearance: alert, cooperative, fatigued, and no distress Head: Normocephalic, without obvious abnormality, atraumatic Neck: no adenopathy, no JVD, supple, symmetrical, trachea midline, and thyroid not enlarged, symmetric, no tenderness/mass/nodules Lymph nodes: Cervical, supraclavicular, and axillary nodes normal. Resp: clear to auscultation bilaterally Back:  negative Cardio: regular rate and rhythm, S1, S2 normal, no murmur, click, rub or gallop GI: soft, non-tender; bowel sounds normal; no masses,  no organomegaly Extremities: extremities normal, atraumatic, no cyanosis or edema  ECOG PERFORMANCE STATUS: 1 - Symptomatic but completely ambulatory  Blood pressure 129/66, pulse (!) 120, temperature 98.1 F (36.7 C), temperature source Tympanic, resp. rate 20, height $RemoveBe'5\' 9"'TjCKEDLWG$  (1.753 m), weight 180 lb 9.6 oz (81.9 kg), SpO2 100 %.  LABORATORY DATA: Lab Results  Component Value Date   WBC 11.8 (H) 03/16/2021   HGB 11.6 (L) 03/16/2021   HCT 33.8 (L) 03/16/2021   MCV 87.1 03/16/2021   PLT 333 03/16/2021      Chemistry      Component Value Date/Time   NA 136 03/16/2021 0843   K 4.3 03/16/2021 0843   CL 102 03/16/2021 0843   CO2 23 03/16/2021 0843   BUN 31 (H) 03/16/2021 0843   CREATININE 1.19 03/16/2021 0843   CREATININE 1.00 02/12/2016 1038      Component Value Date/Time   CALCIUM 9.7 03/16/2021 0843   ALKPHOS 80 03/16/2021 0843   AST 14 (L) 03/16/2021 0843   ALT 16 03/16/2021 0843   BILITOT 1.1 03/16/2021 0843       RADIOGRAPHIC STUDIES: DG Chest 1 View  Result Date: 02/25/2021 CLINICAL DATA:  Post thoracentesis EXAM: CHEST  1 VIEW COMPARISON:  02/24/2021 radiograph and chest CT FINDINGS: Post sternotomy changes. Right-sided central venous catheter tip over the proximal right atrium. Trace right-sided pleural effusion. No visible pneumothorax. Stable cardiomediastinal silhouette with aortic atherosclerosis. Surgical hardware at the thoracic inlet region. IMPRESSION: 1. Trace right pleural effusion without visible pneumothorax post thoracentesis. 2. No significant changes. Electronically Signed   By: Donavan Foil M.D.   On: 02/25/2021 15:27   CT Angio Chest PE W and/or Wo Contrast  Result Date: 02/28/2021 CLINICAL DATA:  Neuroendocrine lung carcinoma with brain metastases, currently on chemo Per EMS, [name], from home, c/o SOB w/  exertion and chronic upper abdominal pain. [name] was discharged yesterday. EXAM: CT ANGIOGRAPHY CHEST WITH CONTRAST TECHNIQUE: Multidetector CT imaging of the chest was performed using the standard protocol during bolus administration of intravenous contrast. Multiplanar CT image reconstructions and MIPs were obtained to evaluate the vascular anatomy. CONTRAST:  2mL OMNIPAQUE IOHEXOL  350 MG/ML SOLN COMPARISON:  CT angiography chest 02/24/2021, 02/14/2021 FINDINGS: Cardiovascular: Fairly satisfactory opacification of the pulmonary arteries to the segmental level. Limited evaluation of the subsegmental level due to motion artifact. No evidence of pulmonary embolism. The main pulmonary artery is normal in caliber. Normal heart size. No significant pericardial effusion. The thoracic aorta is stable in caliber measuring up to 3.8 cm on axial imaging (5:135). At least mild atherosclerotic plaque of the thoracic aorta. Coronary artery calcifications status post coronary artery bypass graft. Mediastinum/Nodes: No enlarged mediastinal, hilar, or axillary lymph nodes. Thyroid gland, trachea, and esophagus demonstrate no significant findings. Lungs/Pleura: Status post right upper lobectomy. Persistent mucous plugging noted within the right lower lobe. Interval decrease in size of a trace right pleural effusion. Passive atelectasis of the right lower lobe. No left pleural effusion. Linear atelectasis of the right middle lobe. No focal consolidation. No pulmonary nodule, no pulmonary mass. Upper Abdomen: No acute abnormality. A couple of splenules are again noted and partially visualized (282, 5:318) Musculoskeletal: No chest wall abnormality. No suspicious lytic or blastic osseous lesions. No acute displaced fracture. Multilevel degenerative changes of the spine. Anterior surgical hardware of the C7-T1 level. Review of the MIP images confirms the above findings. IMPRESSION: 1. No pulmonary embolus. 2. Interval decrease in trace  right pleural effusion in a [name] status post right upper lobectomy. 3. Persistent right lower lobe bronchial debris/mucous plugging. No associated parenchymal or interstitial infection. 4.  Aortic Atherosclerosis (ICD10-I70.0). Electronically Signed   By: Iven Finn M.D.   On: 02/28/2021 23:12   CT Angio Chest PE W and/or Wo Contrast  Result Date: 02/24/2021 CLINICAL DATA:  Lung cancer, chemotherapy, shortness of breath EXAM: CT ANGIOGRAPHY CHEST WITH CONTRAST TECHNIQUE: Multidetector CT imaging of the chest was performed using the standard protocol during bolus administration of intravenous contrast. Multiplanar CT image reconstructions and MIPs were obtained to evaluate the vascular anatomy. CONTRAST:  11mL OMNIPAQUE IOHEXOL 350 MG/ML SOLN COMPARISON:  02/14/2021 FINDINGS: Cardiovascular: Satisfactory opacification of the pulmonary arteries to the segmental level. No evidence of pulmonary embolism. Normal heart size. No pericardial effusion. Coronary artery calcification. Post CABG. Thoracic aorta is stable in caliber measuring dilated at 4 cm along the ascending portion. Mediastinum/Nodes: No new enlarged lymph nodes. Lungs/Pleura: Increased, small to moderate right pleural effusion with adjacent atelectasis. Post right upper lobectomy. Upper Abdomen: No acute abnormality. Musculoskeletal: No acute osseous abnormality. Review of the MIP images confirms the above findings. IMPRESSION: No acute pulmonary embolism. Increased, small to moderate right pleural effusion. Electronically Signed   By: Macy Mis M.D.   On: 02/24/2021 10:12   CT Angio Chest PE W and/or Wo Contrast  Result Date: 02/14/2021 CLINICAL DATA:  Recent surgery. Cancer. Right flank pain. Tachycardia. Pulmonary embolus suspected with high probability. Recent diagnosis of UTI without improvement. History of lung cancer metastatic to brain. EXAM: CT ANGIOGRAPHY CHEST CT ABDOMEN AND PELVIS WITH CONTRAST TECHNIQUE: Multidetector CT  imaging of the chest was performed using the standard protocol during bolus administration of intravenous contrast. Multiplanar CT image reconstructions and MIPs were obtained to evaluate the vascular anatomy. Multidetector CT imaging of the abdomen and pelvis was performed using the standard protocol during bolus administration of intravenous contrast. CONTRAST:  139mL OMNIPAQUE IOHEXOL 350 MG/ML SOLN COMPARISON:  CT chest 12/12/2020.  CT abdomen and pelvis 02/11/2021 FINDINGS: CTA CHEST FINDINGS Cardiovascular: There is good opacification of the central and segmental pulmonary arteries. No focal filling defects. No evidence of significant pulmonary embolus. Ascending  aortic aneurysm measuring 4 cm in diameter, unchanged. No aortic dissection. Great vessel origins are patent. Coronary artery and aortic calcification. Postoperative changes consistent with coronary bypass. Normal heart size. No pericardial effusions. Mediastinum/Nodes: Esophagus is decompressed. No significant lymphadenopathy in the chest. Scattered lymph nodes are not pathologically enlarged. Lungs/Pleura: Small right pleural effusion. Mild patchy areas of airspace disease in the right lung could represent early multifocal pneumonia. Left lung is clear. Previous right upper lung nodule has been resected in the interval. Musculoskeletal: Degenerative changes in the spine. Postoperative changes in the lower cervical spine. Sternotomy wires. Old rib fractures. Review of the MIP images confirms the above findings. CT ABDOMEN and PELVIS FINDINGS Hepatobiliary: Subcentimeter lesion in the dome of the liver likely representing a cavernous hemangioma. Gallbladder and bile ducts are unremarkable. Pancreas: Unremarkable. No pancreatic ductal dilatation or surrounding inflammatory changes. Spleen: Normal in size without focal abnormality. Adrenals/Urinary Tract: No adrenal gland nodules. Cyst in the upper pole left kidney. Nephrograms are symmetrical and  otherwise homogeneous. No hydronephrosis or hydroureter. Bladder is unremarkable. Stomach/Bowel: Stomach, small bowel, and colon are not abnormally distended. No wall thickening or inflammatory changes are apparent. Scattered diverticula in the sigmoid colon without evidence of diverticulitis. Appendix is normal. Vascular/Lymphatic: Aortic atherosclerosis. No enlarged abdominal or pelvic lymph nodes. Reproductive: Prostate is unremarkable. Other: No free air or free fluid in the abdomen. Abdominal wall musculature appears intact. Musculoskeletal: Degenerative changes in the spine. No destructive bone lesions. Review of the MIP images confirms the above findings. IMPRESSION: 1. No evidence of significant pulmonary embolus. 2. Small right pleural effusion. Patchy infiltrates in the right lung are nonspecific but could indicate early multifocal pneumonia. 3. No acute process demonstrated in the abdomen or pelvis. 4. Aortic atherosclerosis. Electronically Signed   By: Lucienne Capers M.D.   On: 02/14/2021 22:09   MR THORACIC SPINE WO CONTRAST  Result Date: 02/19/2021 CLINICAL DATA:  Initial evaluation for intervertebral disc disorder, ongoing back pain. EXAM: MRI THORACIC SPINE WITHOUT CONTRAST TECHNIQUE: Multiplanar, multisequence MR imaging of the thoracic spine was performed. No intravenous contrast was administered. COMPARISON:  Previous MRI from 09/28/2018. FINDINGS: Alignment: Physiologic with preservation of the normal thoracic kyphosis. No listhesis. Vertebrae: Vertebral body height well maintained without acute or chronic fracture. Bone marrow signal intensity within normal limits. No discrete or worrisome osseous lesions. No abnormal marrow edema. Susceptibility artifact from prior ACDF noted at C7-T1. Cord:  Normal signal and morphology. Paraspinal and other soft tissues: Paraspinous soft tissues within normal limits. Small to moderate layering left pleural effusion. Visualized visceral structures  otherwise unremarkable. Disc levels: T1-2:  Unremarkable. T2-3: Unremarkable. T3-4:  Negative interspace.  Mild facet hypertrophy.  No stenosis. T4-5: Negative interspace. Mild right-sided facet hypertrophy. No stenosis. T5-6:  Negative interspace.  Mild facet hypertrophy.  No stenosis. T6-7:  Unremarkable. T7-8:  Unremarkable. T8-9: Right paracentral to subarticular disc protrusion indents the right ventral thecal sac (series 21, image 26). Mild flattening of the right ventral cord without cord signal changes or significant spinal stenosis. Superimposed right-sided reactive endplate spurring. Foramina remain patent. T9-10: Unremarkable. T10-11:  Unremarkable. T11-12: Minimal disc bulge. Mild facet hypertrophy. No canal or foraminal stenosis. T12-L1:  Unremarkable. IMPRESSION: 1. Right paracentral disc protrusion at T8-9 with secondary mild flattening of the right ventral cord, but no cord signal changes or significant stenosis. 2. Mild multilevel facet hypertrophy throughout the thoracic spine as detailed above. Findings could contribute to back pain. 3. Small to moderate layering left pleural effusion. Electronically Signed  By: Jeannine Boga M.D.   On: 02/19/2021 01:54   MR LUMBAR SPINE WO CONTRAST  Result Date: 02/19/2021 CLINICAL DATA:  Initial evaluation for ongoing lower back pain. EXAM: MRI LUMBAR SPINE WITHOUT CONTRAST TECHNIQUE: Multiplanar, multisequence MR imaging of the lumbar spine was performed. No intravenous contrast was administered. COMPARISON:  Prior radiograph from 09/10/2014. FINDINGS: Segmentation: Standard. Lowest well-formed disc space labeled the L5-S1 level. Alignment: Trace retrolisthesis of L2 on L3. Alignment otherwise normal with preservation of the normal lumbar lordosis. Vertebrae: Vertebral body height well maintained without acute or chronic fracture. Bone marrow signal intensity within normal limits. 1.5 cm benign hemangioma noted within the L1 vertebral body. No other  discrete or worrisome osseous lesions. No abnormal marrow edema. Conus medullaris and cauda equina: Conus extends to the L1 level. Conus and cauda equina appear normal. Paraspinal and other soft tissues: Paraspinous soft tissues within normal limits. 2.4 cm T2 hyperintense simple cyst noted within the interpolar left kidney. Visualized visceral structures otherwise unremarkable. Disc levels: L1-2: Degenerative intervertebral disc space narrowing with disc desiccation and diffuse disc bulge. Associated mild reactive endplate change. Minimal facet spurring. No significant spinal stenosis. Foramina remain patent. L2-3: Trace retrolisthesis. Degenerative intervertebral disc space narrowing with diffuse disc bulge and disc desiccation. Mild facet and ligament flavum hypertrophy. No significant spinal stenosis. Foramina remain patent. L3-4: Degenerative intervertebral disc space narrowing with disc desiccation and diffuse disc bulge. Superimposed broad-based left foraminal to extraforaminal disc protrusion contacts the exiting left L4 nerve root as it courses of the left neural foramen (series 8, image 22). Mild bilateral facet hypertrophy. No significant spinal stenosis. Mild left L3 foraminal narrowing. Right neural foramen remains patent. L4-5: Negative interspace. Mild to moderate bilateral facet hypertrophy. Borderline mild narrowing of the lateral recesses bilaterally. Central canal remains patent. Mild bilateral L4 foraminal stenosis. No frank impingement. L5-S1: Disc desiccation with mild disc bulge, slightly eccentric to the right. Associated right paracentral annular fissure at the level of the descending right S1 nerve root sheath. Mild bilateral facet hypertrophy. No canal or lateral recess stenosis. Foramina remain patent. No impingement. IMPRESSION: 1. Broad-based left foraminal to extraforaminal disc protrusion at L3-4, contacting and potentially irritating the exiting left L4 nerve root. 2. Right eccentric  disc bulge with annular fissure at L5-S1, closely approximating and potentially irritating the descending right S1 nerve root. 3. Additional mild for age degenerative disc disease and facet hypertrophy elsewhere within the lumbar spine as above. No other significant stenosis or neural impingement. Electronically Signed   By: Jeannine Boga M.D.   On: 02/19/2021 02:02   CT ABDOMEN PELVIS W CONTRAST  Result Date: 02/14/2021 CLINICAL DATA:  Recent surgery. Cancer. Right flank pain. Tachycardia. Pulmonary embolus suspected with high probability. Recent diagnosis of UTI without improvement. History of lung cancer metastatic to brain. EXAM: CT ANGIOGRAPHY CHEST CT ABDOMEN AND PELVIS WITH CONTRAST TECHNIQUE: Multidetector CT imaging of the chest was performed using the standard protocol during bolus administration of intravenous contrast. Multiplanar CT image reconstructions and MIPs were obtained to evaluate the vascular anatomy. Multidetector CT imaging of the abdomen and pelvis was performed using the standard protocol during bolus administration of intravenous contrast. CONTRAST:  121m OMNIPAQUE IOHEXOL 350 MG/ML SOLN COMPARISON:  CT chest 12/12/2020.  CT abdomen and pelvis 02/11/2021 FINDINGS: CTA CHEST FINDINGS Cardiovascular: There is good opacification of the central and segmental pulmonary arteries. No focal filling defects. No evidence of significant pulmonary embolus. Ascending aortic aneurysm measuring 4 cm in diameter, unchanged. No aortic  dissection. Great vessel origins are patent. Coronary artery and aortic calcification. Postoperative changes consistent with coronary bypass. Normal heart size. No pericardial effusions. Mediastinum/Nodes: Esophagus is decompressed. No significant lymphadenopathy in the chest. Scattered lymph nodes are not pathologically enlarged. Lungs/Pleura: Small right pleural effusion. Mild patchy areas of airspace disease in the right lung could represent early multifocal  pneumonia. Left lung is clear. Previous right upper lung nodule has been resected in the interval. Musculoskeletal: Degenerative changes in the spine. Postoperative changes in the lower cervical spine. Sternotomy wires. Old rib fractures. Review of the MIP images confirms the above findings. CT ABDOMEN and PELVIS FINDINGS Hepatobiliary: Subcentimeter lesion in the dome of the liver likely representing a cavernous hemangioma. Gallbladder and bile ducts are unremarkable. Pancreas: Unremarkable. No pancreatic ductal dilatation or surrounding inflammatory changes. Spleen: Normal in size without focal abnormality. Adrenals/Urinary Tract: No adrenal gland nodules. Cyst in the upper pole left kidney. Nephrograms are symmetrical and otherwise homogeneous. No hydronephrosis or hydroureter. Bladder is unremarkable. Stomach/Bowel: Stomach, small bowel, and colon are not abnormally distended. No wall thickening or inflammatory changes are apparent. Scattered diverticula in the sigmoid colon without evidence of diverticulitis. Appendix is normal. Vascular/Lymphatic: Aortic atherosclerosis. No enlarged abdominal or pelvic lymph nodes. Reproductive: Prostate is unremarkable. Other: No free air or free fluid in the abdomen. Abdominal wall musculature appears intact. Musculoskeletal: Degenerative changes in the spine. No destructive bone lesions. Review of the MIP images confirms the above findings. IMPRESSION: 1. No evidence of significant pulmonary embolus. 2. Small right pleural effusion. Patchy infiltrates in the right lung are nonspecific but could indicate early multifocal pneumonia. 3. No acute process demonstrated in the abdomen or pelvis. 4. Aortic atherosclerosis. Electronically Signed   By: Lucienne Capers M.D.   On: 02/14/2021 22:09   DG Chest Port 1 View  Result Date: 02/28/2021 CLINICAL DATA:  [name] c/o SOB w/ exertion and chronic upper abdominal pain. H/o CAD, Stroke, Myocardial Infarction. Former smoker. dyspnea  EXAM: PORTABLE CHEST 1 VIEW COMPARISON:  02/25/2021 FINDINGS: Port in the anterior chest wall with tip in distal SVC. Sternotomy wires overlie normal cardiac silhouette. Normal pulmonary vasculature. Small chronic RIGHT effusion. No infiltrate or pneumothorax. No acute osseous abnormality. Anterior cervical fusion IMPRESSION: 1. Small chronic RIGHT effusion. 2. No pneumonia or edema. Electronically Signed   By: Suzy Bouchard M.D.   On: 02/28/2021 19:19   DG Chest Port 1 View  Result Date: 02/24/2021 CLINICAL DATA:  Chest pain and shortness of breath. History of lung cancer. Question sepsis. EXAM: PORTABLE CHEST 1 VIEW COMPARISON:  01/07/2021 FINDINGS: Power port placed from a right internal jugular approach has its tip at the SVC RA junction. Previous median sternotomy and CABG. Bronchial valves present on the right. Previous lobectomy on the right. No sign of active infiltrate or collapse. There may be a small amount of sub pulmonic effusion on the right. IMPRESSION: Bronchial valves on the right. Small amount of sub pulmonic effusion on the right. No pneumonia visible by radiography. Electronically Signed   By: Nelson Chimes M.D.   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 Request received for diagnostic and therapeutic right thoracentesis. EXAM: ULTRASOUND GUIDED DIAGNOSTIC AND THERAPEUTIC RIGHT THORACENTESIS MEDICATIONS: 1% lidocaine to skin  and subcutaneous tissue COMPLICATIONS: None immediate. PROCEDURE: An ultrasound guided thoracentesis was thoroughly discussed with the [name] and questions answered. The benefits, risks, alternatives and complications were also discussed. The [name] understands and wishes to  proceed with the procedure. Written consent was obtained. Ultrasound was performed to localize and mark an adequate pocket of fluid in the right chest. The area was then prepped and draped in the normal sterile fashion. 1% Lidocaine was used for local anesthesia. Under ultrasound guidance a 6 Fr Safe-T-Centesis catheter was introduced. Thoracentesis was performed. The catheter was removed and a dressing applied. FINDINGS: A total of approximately 220 cc of hazy, amber fluid was removed. Samples were sent to the laboratory as requested by the clinical team. IMPRESSION: Successful ultrasound guided diagnostic and therapeutic right thoracentesis yielding 220 cc of pleural fluid. Read by: Rowe Robert, PA-C Electronically Signed   By: Aletta Edouard M.D.   On: 02/25/2021 14:51    ASSESSMENT AND PLAN: This is a very pleasant 65 years old white male recently diagnosed with a stage IV (T1c, N1, M1 B) large cell neuroendocrine carcinoma presented with right upper lobe lung nodule in addition to right hilar adenopathy in April 2022 and the [name] was found to have solitary brain metastasis in May 2022 He is status post right upper lobectomy with lymph node sampling under the care of Dr. Kipp Brood. The [name] is currently undergoing systemic chemotherapy with cisplatin 80 mg/M2 on day 1 and etoposide 100 Mg/M2 on days 1, 2 and 3 every 3 weeks.  He is status post 2 cycles.  He tolerated the first cycle of his treatment well with no concerning adverse effect except for few admission to the hospital with neutropenia and neutropenic fever as well as bacteremia.   He is also undergoing SRS to the solitary brain metastasis. The [name] is feeling fine today except for the fatigue and lack of appetite. I will arrange for him to receive 1 L of normal saline today for the tachycardia and dehydration. He is also scheduled to receive Neulasta injection today. I will see him back for follow-up visit in 2 weeks for evaluation  before starting cycle #3. He was advised to call immediately if he has any other concerning symptoms in the interval. The [name] voices understanding of current disease status and treatment options and is in agreement with the current care plan. All questions were answered. The [name] knows to call the clinic with any problems, questions or concerns. We can certainly see the [name] much sooner if necessary.   Disclaimer: This note was dictated with voice recognition software. Similar sounding words can inadvertently be transcribed and may not be corrected upon review.

## 2021-03-16 NOTE — Progress Notes (Signed)
  Radiation Oncology         [phone] ________________________________  Name: [name] MRN: [id number]  Date of Service: 03/16/2021  DOB: 03/15/1956  Post Treatment Telephone Note  Diagnosis:   Stage IV, pT1cN1M1b, large cell neuroendocrine carcinoma of the RUL with solitary left cerebellar metastasis.   Interval Since Last Radiation:  5 weeks   02/13/2021 through 02/13/2021 SRS Treatment: Site Technique Total Dose (Gy) Dose per Fx (Gy) Completed Fx Beam Energies  Brain: [id number] 3D 20/20 20 1/1 6XFFF  Brain: PTV_2 Lt Frontal 38mm 3D 20/20 20 1/1 6XFFF    Narrative:  The patient was contacted today for routine follow-up. During treatment he did very well with radiotherapy and did not have significant desquamation. He reports he is doing pretty well. He has had a tough time with side effects related to chemotherapy, but denies headaches or fatigue that he feels were directly related to his radiation.  Impression/Plan: 1. 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 The patient has been doing well since completion of radiotherapy. We will plan to proceed with repeat MRI in September 2022, but he will also continue to follow up with Dr. [name] in medical oncology.      Carola Rhine, PAC

## 2021-03-17 ENCOUNTER — Ambulatory Visit: Payer: Medicare Other | Admitting: Rehabilitative and Restorative Service Providers"

## 2021-03-19 ENCOUNTER — Encounter (HOSPITAL_COMMUNITY): Payer: Self-pay | Admitting: Nurse Practitioner

## 2021-03-19 ENCOUNTER — Other Ambulatory Visit: Payer: Self-pay

## 2021-03-19 ENCOUNTER — Ambulatory Visit (HOSPITAL_COMMUNITY)
Admission: RE | Admit: 2021-03-19 | Discharge: 2021-03-19 | Disposition: A | Discharge status: Home / Self Care | Payer: Medicare Other | Source: Ambulatory Visit | Attending: Nurse Practitioner | Admitting: Nurse Practitioner

## 2021-03-19 VITALS — BP 118/76 | HR 113 | Ht 69.0 in | Wt 181.4 lb

## 2021-03-19 DIAGNOSIS — Z79899 Other long term (current) drug therapy: Secondary | ICD-10-CM | POA: Insufficient documentation

## 2021-03-19 DIAGNOSIS — I10 Essential (primary) hypertension: Secondary | ICD-10-CM | POA: Insufficient documentation

## 2021-03-19 DIAGNOSIS — Z9221 Personal history of antineoplastic chemotherapy: Secondary | ICD-10-CM | POA: Diagnosis not present

## 2021-03-19 DIAGNOSIS — Z7982 Long term (current) use of aspirin: Secondary | ICD-10-CM | POA: Insufficient documentation

## 2021-03-19 DIAGNOSIS — I252 Old myocardial infarction: Secondary | ICD-10-CM | POA: Diagnosis not present

## 2021-03-19 DIAGNOSIS — C7931 Secondary malignant neoplasm of brain: Secondary | ICD-10-CM | POA: Diagnosis not present

## 2021-03-19 DIAGNOSIS — I2510 Atherosclerotic heart disease of native coronary artery without angina pectoris: Secondary | ICD-10-CM | POA: Insufficient documentation

## 2021-03-19 DIAGNOSIS — Z888 Allergy status to other drugs, medicaments and biological substances status: Secondary | ICD-10-CM | POA: Diagnosis not present

## 2021-03-19 DIAGNOSIS — C3490 Malignant neoplasm of unspecified part of unspecified bronchus or lung: Secondary | ICD-10-CM | POA: Insufficient documentation

## 2021-03-19 DIAGNOSIS — Z8249 Family history of ischemic heart disease and other diseases of the circulatory system: Secondary | ICD-10-CM | POA: Insufficient documentation

## 2021-03-19 DIAGNOSIS — Z87891 Personal history of nicotine dependence: Secondary | ICD-10-CM | POA: Insufficient documentation

## 2021-03-19 DIAGNOSIS — Z7901 Long term (current) use of anticoagulants: Secondary | ICD-10-CM | POA: Insufficient documentation

## 2021-03-19 DIAGNOSIS — Z951 Presence of aortocoronary bypass graft: Secondary | ICD-10-CM | POA: Diagnosis not present

## 2021-03-19 DIAGNOSIS — I480 Paroxysmal atrial fibrillation: Secondary | ICD-10-CM

## 2021-03-19 DIAGNOSIS — Z7902 Long term (current) use of antithrombotics/antiplatelets: Secondary | ICD-10-CM | POA: Insufficient documentation

## 2021-03-19 DIAGNOSIS — Z955 Presence of coronary angioplasty implant and graft: Secondary | ICD-10-CM | POA: Insufficient documentation

## 2021-03-19 DIAGNOSIS — R000 Tachycardia, unspecified: Secondary | ICD-10-CM

## 2021-03-19 DIAGNOSIS — I4891 Unspecified atrial fibrillation: Secondary | ICD-10-CM | POA: Diagnosis not present

## 2021-03-19 NOTE — Progress Notes (Signed)
Primary Care Physician: [name], MD Referring Physician: [facility] f/u Cardiologist: Dr. Irish Lack Oncology: Dr. Erma Heritage is a 65 y.o. 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 fib not on anticoagulation, low back pain secondary to L3-L4 disc protrusion, recent hospitalization from 02/24/2021-02/27/2021 for dyspnea on exertion/neutropenia treated with broad-spectrum antibiotics and right-sided thoracentesis with removal of 220 cc fluid along with Granix for neutropenia with subsequent improvement of neutropenia and discharged home on 02/27/2021 off antibiotics presented on 02/28/2021 with worsening shortness of breath.  His oxygen saturation was in the 80s on room air at home.  On presentation, oxygen saturation will drop into the mid 80s on ambulation as per the ED provider.  He had leukocytosis, possibly secondary to recent Granix.  Chest x-ray showed a small right pleural effusion.  Due to hospitalizing, his respiratory status remained stable and he currently on room air.   He is now in the afib clinic for an appointment that was scheduled right after his hospitalization 4/27 to 5/11 at which time he had rt upper lobectomy for lung CA and developed afib with RVR. He was started on amiodarone IV and then switched to po amiodarone at d/c. He wore a monitor at d/c that did not show any afib. He was seen by many cardiologists with this hospitalization and decision to start anticoagulation  was deferred due to surgery and resuming SR. This afib clinic visit was rescheduled several times due to repeated hospitalizations. He is suppose to be on plavix and asa for his CAD. States that he stopped plavix, someone told him to, but last d/c instructions listed both drugs.   Most  recent hospitalization showed ekg's with NSR, but PAF was noted on problem list, was mentioned, rate controlled? I reviewed cardiac monitoring strips as well that showed SR with some PVC's.   He is bothered with increased heart rate going up stairs at home. He has checked pulse ox and states he is not hypoxic with walking. EKG today shows sinus tach at 113 bpm, at rest later in visit HR at 103 bpm. I walked down the hall, HR increased to 112 bpm, PO 98%. Last oncology visit 7/18, it was mentioned that sinus tach was from poor oral intake and dehydration and he was given IV fluids. He remains on amiodarone 200 mg daily. CHA2DS2VASc score is at least  3.   Today, he denies symptoms of   chest pain, shortness of breath, orthopnea, PND, lower extremity edema,  presyncope, syncope, or neurologic sequela.+ for palpitations, fatigue, shortness of breath.The patient is tolerating medications without difficulties and is otherwise without complaint today.   Past Medical History:  Diagnosis Date   Anxiety    Arthritis    Basal cell carcinoma (BCC) of forehead    CAD (coronary artery disease)    a. 10/2015 ant STEMI >> LHC with 3 v CAD; oLAD tx with POBA >> emergent CABG. b. Multiple evals since that time, early graft failure of SVG-RCA by cath 03/2016. c. 2/19 PCI/DES x1 to pRCA, normal EF.   Carotid artery disease ([place])    a. 40-59% BICA 02/2018.   Depression    Dyspnea    Ectopic atrial tachycardia (HCC)    Esophageal reflux    eosinophil esophagitis   Family  history of adverse reaction to anesthesia    "sister has PONV" (06/21/2017)   Former tobacco use    Gout    Hepatitis C    "treated and cured" (06/21/2017)   High cholesterol    History of kidney stones    Hypertension    Ischemic cardiomyopathy    a. EF 25-30% at intraop TEE 4/17  //  b. Limited Echo 5/17 - EF 45-50%, mild ant HK. c. EF 55-65% by cath 09/2017.   Migraine    "3-4/yr" (06/21/2017)   Myocardial infarction ([place]) 10/2015    Palpitations    Sinus bradycardia    a. HR dropping into 40s in 02/2016 -> BB reduced.   Stroke [facility]) 10/2016   "small one; sometimes my memory/cognitive issues" (06/21/2017)   Symptomatic hypotension    a. 02/2016 ER visit -> meds reduced.   Syncope    Wears dentures    Wears glasses    Past Surgical History:  Procedure Laterality Date   25 GAUGE PARS PLANA VITRECTOMY WITH 20 GAUGE MVR PORT  12/31/2020   Procedure: 25 GAUGE PARS PLANA VITRECTOMY WITH 20 GAUGE MVR PORT;  Surgeon: [name], MD;  Location: [place];  Service: Thoracic;;   ANTERIOR CERVICAL DECOMP/DISCECTOMY FUSION N/A 10/17/2018   Procedure: Anterior Cervical Decompression Fusion - Cervical seven -Thoracic one;  Surgeon: Consuella Lose, MD;  Location: [place];  Service: Neurosurgery;  Laterality: N/A;   BASAL CELL CARCINOMA EXCISION     "forehead   BIOPSY  07/20/2019   Procedure: BIOPSY;  Surgeon: Carol Ada, MD;  Location: [facility];  Service: Endoscopy;;   BRONCHIAL BIOPSY  12/24/2020   Procedure: BRONCHIAL BIOPSIES;  Surgeon: Garner Nash, DO;  Location: [place] [facility];  Service: Pulmonary;;   BRONCHIAL BRUSHINGS  12/24/2020   Procedure: BRONCHIAL BRUSHINGS;  Surgeon: Garner Nash, DO;  Location: [place];  Service: Pulmonary;;   BRONCHIAL NEEDLE ASPIRATION BIOPSY  12/24/2020   Procedure: BRONCHIAL NEEDLE ASPIRATION BIOPSIES;  Surgeon: Garner Nash, DO;  Location: [place];  Service: Pulmonary;;   CARDIAC CATHETERIZATION N/A 11/28/2015   Procedure: Left Heart Cath and Coronary Angiography;  Surgeon: [name], MD;  Location: [place] [facility];  Service: Cardiovascular;  Laterality: N/A;   CARDIAC CATHETERIZATION N/A 11/28/2015   Procedure: Coronary Balloon Angioplasty;  Surgeon: [name], MD;  Location: [place] [facility];  Service: Cardiovascular;  Laterality: N/A;  ostial LAD   CARDIAC CATHETERIZATION N/A 11/28/2015   Procedure: Coronary/Graft Angiography;  Surgeon:  [name], MD;  Location: [place] [facility];  Service: Cardiovascular;  Laterality: N/A;  coronaries only    CARDIAC CATHETERIZATION N/A 04/21/2016   Procedure: Left Heart Cath and Coronary Angiography;  Surgeon: Wellington Hampshire, MD;  Location: [place] [facility];  Service: Cardiovascular;  Laterality: N/A;   CARDIAC CATHETERIZATION N/A 06/14/2016   Procedure: Left Heart Cath and Cors/Grafts Angiography;  Surgeon: Lorretta Harp, MD;  Location: [place] [facility];  Service: Cardiovascular;  Laterality: N/A;   CARDIAC CATHETERIZATION N/A 09/08/2016   Procedure: Left Heart Cath and Cors/Grafts Angiography;  Surgeon: Wellington Hampshire, MD;  Location: [place] [facility];  Service: Cardiovascular;  Laterality: N/A;   CARDIAC CATHETERIZATION     CORONARY ARTERY BYPASS GRAFT N/A 11/28/2015   Procedure: CORONARY ARTERY BYPASS GRAFTING (CABG) TIMES FIVE USING LEFT INTERNAL MAMMARY ARTERY AND RIGHT GREATER SAPHENOUS,VIEN HARVEATED BY ENDOVIEN, INTRAOPPRATIVE TEE;  Surgeon: Gaye Pollack, MD;  Location: [place];  Service: Open Heart Surgery;  Laterality: N/A;   CORONARY STENT INTERVENTION N/A 10/05/2017   Procedure: CORONARY STENT INTERVENTION;  Surgeon: [name], MD;  Location: [place] [facility];  Service: Cardiovascular;  Laterality: N/A;   ESOPHAGOGASTRODUODENOSCOPY (EGD) WITH PROPOFOL N/A 07/20/2019   Procedure: ESOPHAGOGASTRODUODENOSCOPY (EGD) WITH PROPOFOL;  Surgeon: Carol Ada, MD;  [facility];  Service: Endoscopy;  Laterality: N/A;   ESOPHAGOGASTRODUODENOSCOPY (EGD) WITH PROPOFOL N/A 01/30/2021   Procedure: ESOPHAGOGASTRODUODENOSCOPY (EGD) WITH PROPOFOL;  Surgeon: Carol Ada, MD;  [facility];  Service: Endoscopy;  Laterality: N/A;   FIDUCIAL MARKER PLACEMENT  12/24/2020   Procedure: FIDUCIAL DYE MARKER PLACEMENT;  Surgeon: Garner Nash, DO;  Location: [place] [facility];  Service: Pulmonary;;   HUMERUS SURGERY Right 1969   "tumor inside bone; filled it  w/bone chips"   INTERCOSTAL NERVE BLOCK Right 12/24/2020   Procedure: INTERCOSTAL NERVE BLOCK;  Surgeon: [name], MD;  Location: [place];  Service: Thoracic;  Laterality: Right;   IR IMAGING GUIDED PORT INSERTION  02/06/2021   LEFT HEART CATH AND CORS/GRAFTS ANGIOGRAPHY N/A 03/11/2017   Procedure: Left Heart Cath and Cors/Grafts Angiography;  Surgeon: Leonie Man, MD;  Location: [place] [facility];  Service: Cardiovascular;  Laterality: N/A;   LEFT HEART CATH AND CORS/GRAFTS ANGIOGRAPHY N/A 10/05/2017   Procedure: LEFT HEART CATH AND CORS/GRAFTS ANGIOGRAPHY;  Surgeon: [name], MD;  Location: [place] [facility];  Service: Cardiovascular;  Laterality: N/A;   LEFT HEART CATH AND CORS/GRAFTS ANGIOGRAPHY N/A 04/11/2019   Procedure: LEFT HEART CATH AND CORS/GRAFTS ANGIOGRAPHY;  Surgeon: [name], MD;  Location: [place] [facility];  Service: Cardiovascular;  Laterality: N/A;   NODE DISSECTION Right 12/24/2020   Procedure: NODE DISSECTION;  Surgeon: [name], MD;  Location: [place];  Service: Thoracic;  Laterality: Right;   PERIPHERAL VASCULAR CATHETERIZATION N/A 06/14/2016   Procedure: Lower Extremity Angiography;  Surgeon: Lorretta Harp, MD;  Location: [place] [facility];  Service: Cardiovascular;  Laterality: N/A;   VIDEO BRONCHOSCOPY WITH ENDOBRONCHIAL NAVIGATION Right 12/24/2020   Procedure: VIDEO BRONCHOSCOPY WITH ENDOBRONCHIAL NAVIGATION;  Surgeon: Garner Nash, DO;  Location: [place];  Service: Pulmonary;  Laterality: Right;   VIDEO BRONCHOSCOPY WITH ENDOBRONCHIAL ULTRASOUND N/A 12/24/2020   Procedure: VIDEO BRONCHOSCOPY WITH ENDOBRONCHIAL ULTRASOUND;  Surgeon: Garner Nash, DO;  Location: [place];  Service: Pulmonary;  Laterality: N/A;   VIDEO BRONCHOSCOPY WITH INSERTION OF INTERBRONCHIAL VALVE (IBV) N/A 12/31/2020   Procedure: VIDEO BRONCHOSCOPY WITH INSERTION OF INTERBRONCHIAL VALVE (IBV).VALVE IN CARTRIDGE 88mm,9mm. CHEST TUBE  PLACEMENT.;  Surgeon: [name], MD;  Location: [facility];  Service: Thoracic;  Laterality: N/A;    Current Outpatient Medications  Medication Sig Dispense Refill   acetaminophen (TYLENOL) 500 MG tablet Take 1,000 mg by mouth every 6 (six) hours as needed for moderate pain.     albuterol (VENTOLIN HFA) 108 (90 Base) MCG/ACT inhaler Inhale 2 puffs into the lungs every 6 (six) hours as needed for wheezing or shortness of breath. 8 g 2   amiodarone (PACERONE) 200 MG tablet Take 1 tablet (200 mg total) by mouth daily. 30 tablet 1   amLODipine (NORVASC) 10 MG tablet TAKE 1 TABLET(10 MG) BY MOUTH DAILY 90 tablet 1   aspirin EC 81 MG tablet Take 81 mg by mouth daily. Swallow whole.     atorvastatin (LIPITOR) 80 MG tablet TAKE 1 TABLET(80 MG) BY MOUTH DAILY 90 tablet 2   cloNIDine (CATAPRES) 0.1  MG tablet Take 0.1 mg by mouth at bedtime.     clopidogrel (PLAVIX) 75 MG tablet TAKE 1 TABLET BY MOUTH EVERY DAY 90 tablet 1   lidocaine-prilocaine (EMLA) cream Apply 1 application topically once as needed (port access).     magnesium oxide (MAG-OX) 400 (240 Mg) MG tablet Take 1 tablet (400 mg total) by mouth daily. 30 tablet 1   metoprolol succinate (TOPROL-XL) 25 MG 24 hr tablet Take 1 tablet (25 mg total) by mouth daily. 90 tablet 3   nitroGLYCERIN (NITROSTAT) 0.4 MG SL tablet PLACE 1 TABLET UNDER THE TONGUE EVERY 5 MINUTES AS NEEDED FOR CHEST PAIN. 3 DOSES MAX 25 tablet 4   ondansetron (ZOFRAN) 8 MG tablet Take 1 tablet (8 mg total) by mouth every 8 (eight) hours as needed for nausea or vomiting. Starting 3 days after chemotherapy 30 tablet 2   oxyCODONE-acetaminophen (PERCOCET/ROXICET) 5-325 MG tablet Take 1 tablet by mouth every 4 (four) hours as needed for severe pain. 30 tablet 0   prochlorperazine (COMPAZINE) 10 MG tablet Take 1 tablet (10 mg total) by mouth every 6 (six) hours as needed. 30 tablet 2   ranolazine (RANEXA) 1000 MG SR tablet Take 1 tablet (1,000 mg total) by mouth 2 (two) times  daily. 180 tablet 0   Tiotropium Bromide-Olodaterol (STIOLTO RESPIMAT) 2.5-2.5 MCG/ACT AERS Inhale 2 puffs into the lungs daily. 4 g 3   No current facility-administered medications for this encounter.    Allergies  Allergen Reactions   Prednisone Other (See Comments)    States that this med makes him "crazy"   Tetanus Toxoids Swelling and Other (See Comments)    Fever, Swelling of the arm    Wellbutrin [Bupropion] Other (See Comments)    Crazy thoughts, nightmares   Indomethacin Other (See Comments)    rectal bleeding   Other     Other reaction(s): Unknown   Varenicline Other (See Comments)    Dreams Other reaction(s): Unknown Other reaction(s): Unknown    Social History   Socioeconomic History   Marital status: Married    Spouse name: [name]   Number of children: 3   Years of education: College   Highest education level: Not on file  Occupational History   Occupation: Scientist, research (physical sciences): SELF-EMPLOYED  Tobacco Use   Smoking status: Former    Packs/day: 0.75    Years: 44.00    Pack years: 33.00    Types: Cigarettes    Quit date: 11/28/2015    Years since quitting: 5.3   Smokeless tobacco: Never  Vaping Use   Vaping Use: Never used  Substance and Sexual Activity   Alcohol use: Yes    Comment: occ   Drug use: No    Comment: 06/21/2017 "nothing since the 1980s"   Sexual activity: Yes  Other Topics Concern   Not on file  Social History Narrative   Patient lives at home with his spouse.   Caffeine Use: yes   Social Determinants of Health   Financial Resource Strain: Not on file  Food Insecurity: Not on file  Transportation Needs: Not on file  Physical Activity: Not on file  Stress: Not on file  Social Connections: Not on file  Intimate Partner Violence: Not on file    Family History  Problem Relation Age of Onset   Lung cancer Mother    Heart Problems Father    Heart attack Father 5   Stroke Father    Heart failure Father  Heart attack  Maternal Grandmother    Stroke Maternal Grandmother    Heart attack Paternal Uncle    Hypertension Brother    Autoimmune disease Neg Hx     ROS- All systems are reviewed and negative except as per the HPI above  Physical Exam: There were no vitals filed for this visit. Wt Readings from Last 3 Encounters:  03/16/21 81.9 kg  03/13/21 86.6 kg  03/09/21 85.2 kg    Labs: Lab Results  Component Value Date   NA 136 03/16/2021   K 4.3 03/16/2021   CL 102 03/16/2021   CO2 23 03/16/2021   GLUCOSE 110 (H) 03/16/2021   BUN 31 (H) 03/16/2021   CREATININE 1.19 03/16/2021   CALCIUM 9.7 03/16/2021   PHOS 3.1 02/18/2021   MG 1.8 03/16/2021   Lab Results  Component Value Date   INR 1.0 02/28/2021   Lab Results  Component Value Date   CHOL 82 12/27/2020   HDL 35 (L) 12/27/2020   LDLCALC 28 12/27/2020   TRIG 97 12/27/2020     GEN- The patient is well appearing, alert and oriented x 3 today.   Head- normocephalic, atraumatic Eyes-  Sclera clear, conjunctiva pink Ears- hearing intact Oropharynx- clear Neck- supple, no JVP Lymph- no cervical lymphadenopathy Lungs- Clear to ausculation bilaterally, normal work of breathing Heart- Regular rate and rhythm, no murmurs, rubs or gallops, PMI not laterally displaced GI- soft, NT, ND, + BS Extremities- no clubbing, cyanosis, or edema MS- no significant deformity or atrophy Skin- no rash or lesion Psych- euthymic mood, full affect Neuro- strength and sensation are intact  EKG-Sinus tach at 113 bpm   1. Left ventricular ejection fraction, by estimation, is 55 to 60%. The  left ventricle has normal function. The left ventricle has no regional  wall motion abnormalities. Left ventricular diastolic parameters are  consistent with Grade I diastolic  dysfunction (impaired relaxation).   2. Right ventricular systolic function is normal. The right ventricular  size is normal.   3. The mitral valve is normal in structure. No evidence of  mitral valve  regurgitation. No evidence of mitral stenosis.   4. The aortic valve is normal in structure. Aortic valve regurgitation is  not visualized. No aortic stenosis is present.   5. The inferior vena cava is normal in size with greater than 50%  respiratory variability, suggesting right atrial pressure of 3 mmHg.  Echo-1. Left ventricular ejection fraction, by estimation, is 55 to 60%. The  left ventricle has normal function. The left ventricle has no regional  wall motion abnormalities. Left ventricular diastolic parameters are  consistent with Grade I diastolic  dysfunction (impaired relaxation).   2. Right ventricular systolic function is normal. The right ventricular  size is normal.   3. The mitral valve is normal in structure. No evidence of mitral valve  regurgitation. No evidence of mitral stenosis.   4. The aortic valve is normal in structure. Aortic valve regurgitation is  not visualized. No aortic stenosis is present.   5. The inferior vena cava is normal in size with greater than 50%  respiratory variability, suggesting right atrial pressure of 3 mmHg.   Comparison(s): No significant change from prior study.  Assessment and Plan:  1. Afib In the setting of lobectomy for lung CA in April/MAY 2022 On amiodarone 200 mg daily Appears to be staying in SR  Sinus tach is thought to be from poor oral intake, dehydration, deconditioning from CA treatment Continue metoprolol 25  mg daily  2. CHA2DS2VASc  score of at least 3 His need for anticoagulation has been deferred being seen by many providers  with his many hospitalizations  with previous surgery and ongoing CA treatment  I do not see any return of afib with review of EKG/s, hospital monitor strips and  zio monitor I will refer back to Dr. Irish Lack to further evaluate for need to start anticoagulation with ongoing chemo for lung CA and radiation for brain mets   3.CAD No current anginal symptoms He was told to stop  plavix, continue asa  Recent d/c summary shows both drugs  Dr. Irish Lack to review   4. Htn Stable   Will request as asap  f/u with  Dr. Sherril Croon C. Bryar Dahms, [facility] [address] [place], [place] [address] [phone]

## 2021-03-20 ENCOUNTER — Encounter (HOSPITAL_COMMUNITY): Payer: Self-pay | Admitting: Nurse Practitioner

## 2021-03-22 ENCOUNTER — Inpatient Hospital Stay (HOSPITAL_BASED_OUTPATIENT_CLINIC_OR_DEPARTMENT_OTHER)
Admission: EM | Admit: 2021-03-22 | Discharge: 2021-03-26 | DRG: 871 | Disposition: A | Discharge status: Home / Self Care | Payer: Medicare Other | Attending: Internal Medicine | Admitting: Internal Medicine

## 2021-03-22 ENCOUNTER — Other Ambulatory Visit: Payer: Self-pay

## 2021-03-22 ENCOUNTER — Emergency Department (HOSPITAL_BASED_OUTPATIENT_CLINIC_OR_DEPARTMENT_OTHER): Payer: Medicare Other

## 2021-03-22 DIAGNOSIS — M79672 Pain in left foot: Secondary | ICD-10-CM

## 2021-03-22 DIAGNOSIS — G893 Neoplasm related pain (acute) (chronic): Secondary | ICD-10-CM | POA: Diagnosis not present

## 2021-03-22 DIAGNOSIS — I252 Old myocardial infarction: Secondary | ICD-10-CM

## 2021-03-22 DIAGNOSIS — F32A Depression, unspecified: Secondary | ICD-10-CM | POA: Diagnosis not present

## 2021-03-22 DIAGNOSIS — C3491 Malignant neoplasm of unspecified part of right bronchus or lung: Secondary | ICD-10-CM | POA: Diagnosis not present

## 2021-03-22 DIAGNOSIS — Z79899 Other long term (current) drug therapy: Secondary | ICD-10-CM

## 2021-03-22 DIAGNOSIS — Z888 Allergy status to other drugs, medicaments and biological substances status: Secondary | ICD-10-CM

## 2021-03-22 DIAGNOSIS — E440 Moderate protein-calorie malnutrition: Secondary | ICD-10-CM | POA: Diagnosis present

## 2021-03-22 DIAGNOSIS — I480 Paroxysmal atrial fibrillation: Secondary | ICD-10-CM | POA: Diagnosis present

## 2021-03-22 DIAGNOSIS — Z8673 Personal history of transient ischemic attack (TIA), and cerebral infarction without residual deficits: Secondary | ICD-10-CM

## 2021-03-22 DIAGNOSIS — I129 Hypertensive chronic kidney disease with stage 1 through stage 4 chronic kidney disease, or unspecified chronic kidney disease: Secondary | ICD-10-CM | POA: Diagnosis not present

## 2021-03-22 DIAGNOSIS — N1831 Chronic kidney disease, stage 3a: Secondary | ICD-10-CM

## 2021-03-22 DIAGNOSIS — F419 Anxiety disorder, unspecified: Secondary | ICD-10-CM | POA: Diagnosis present

## 2021-03-22 DIAGNOSIS — C7931 Secondary malignant neoplasm of brain: Secondary | ICD-10-CM | POA: Diagnosis not present

## 2021-03-22 DIAGNOSIS — C3490 Malignant neoplasm of unspecified part of unspecified bronchus or lung: Secondary | ICD-10-CM | POA: Diagnosis present

## 2021-03-22 DIAGNOSIS — D630 Anemia in neoplastic disease: Secondary | ICD-10-CM | POA: Diagnosis not present

## 2021-03-22 DIAGNOSIS — Z801 Family history of malignant neoplasm of trachea, bronchus and lung: Secondary | ICD-10-CM

## 2021-03-22 DIAGNOSIS — N183 Chronic kidney disease, stage 3 unspecified: Secondary | ICD-10-CM

## 2021-03-22 DIAGNOSIS — E7800 Pure hypercholesterolemia, unspecified: Secondary | ICD-10-CM | POA: Diagnosis not present

## 2021-03-22 DIAGNOSIS — I2510 Atherosclerotic heart disease of native coronary artery without angina pectoris: Secondary | ICD-10-CM | POA: Diagnosis not present

## 2021-03-22 DIAGNOSIS — Z8249 Family history of ischemic heart disease and other diseases of the circulatory system: Secondary | ICD-10-CM

## 2021-03-22 DIAGNOSIS — Z902 Acquired absence of lung [part of]: Secondary | ICD-10-CM

## 2021-03-22 DIAGNOSIS — I959 Hypotension, unspecified: Secondary | ICD-10-CM | POA: Diagnosis present

## 2021-03-22 DIAGNOSIS — Z7982 Long term (current) use of aspirin: Secondary | ICD-10-CM

## 2021-03-22 DIAGNOSIS — Z87442 Personal history of urinary calculi: Secondary | ICD-10-CM

## 2021-03-22 DIAGNOSIS — J189 Pneumonia, unspecified organism: Secondary | ICD-10-CM | POA: Diagnosis not present

## 2021-03-22 DIAGNOSIS — A419 Sepsis, unspecified organism: Principal | ICD-10-CM | POA: Diagnosis present

## 2021-03-22 DIAGNOSIS — Z20822 Contact with and (suspected) exposure to covid-19: Secondary | ICD-10-CM | POA: Diagnosis present

## 2021-03-22 DIAGNOSIS — Z87891 Personal history of nicotine dependence: Secondary | ICD-10-CM

## 2021-03-22 DIAGNOSIS — E43 Unspecified severe protein-calorie malnutrition: Secondary | ICD-10-CM | POA: Diagnosis not present

## 2021-03-22 DIAGNOSIS — K219 Gastro-esophageal reflux disease without esophagitis: Secondary | ICD-10-CM | POA: Diagnosis present

## 2021-03-22 DIAGNOSIS — Z85828 Personal history of other malignant neoplasm of skin: Secondary | ICD-10-CM | POA: Diagnosis not present

## 2021-03-22 DIAGNOSIS — B370 Candidal stomatitis: Secondary | ICD-10-CM | POA: Diagnosis not present

## 2021-03-22 DIAGNOSIS — Z951 Presence of aortocoronary bypass graft: Secondary | ICD-10-CM | POA: Diagnosis not present

## 2021-03-22 DIAGNOSIS — M109 Gout, unspecified: Secondary | ICD-10-CM | POA: Diagnosis present

## 2021-03-22 DIAGNOSIS — J309 Allergic rhinitis, unspecified: Secondary | ICD-10-CM | POA: Diagnosis not present

## 2021-03-22 DIAGNOSIS — Z955 Presence of coronary angioplasty implant and graft: Secondary | ICD-10-CM

## 2021-03-22 DIAGNOSIS — Z7902 Long term (current) use of antithrombotics/antiplatelets: Secondary | ICD-10-CM

## 2021-03-22 DIAGNOSIS — Z887 Allergy status to serum and vaccine status: Secondary | ICD-10-CM

## 2021-03-22 DIAGNOSIS — Z6826 Body mass index (BMI) 26.0-26.9, adult: Secondary | ICD-10-CM

## 2021-03-22 DIAGNOSIS — Z823 Family history of stroke: Secondary | ICD-10-CM

## 2021-03-22 DIAGNOSIS — Z981 Arthrodesis status: Secondary | ICD-10-CM

## 2021-03-22 LAB — URINALYSIS, ROUTINE W REFLEX MICROSCOPIC
Bilirubin Urine: NEGATIVE
Glucose, UA: NEGATIVE mg/dL
Hgb urine dipstick: NEGATIVE
Ketones, ur: NEGATIVE mg/dL
Leukocytes,Ua: NEGATIVE
Nitrite: NEGATIVE
Protein, ur: NEGATIVE mg/dL
Specific Gravity, Urine: <1.005 — ABNORMAL LOW (ref 1.005–1.030)
pH: 6 (ref 5.0–8.0)

## 2021-03-22 LAB — COMPREHENSIVE METABOLIC PANEL
ALT: 12 U/L (ref 0–44)
AST: 16 U/L (ref 15–41)
Albumin: 3.2 g/dL — ABNORMAL LOW (ref 3.5–5.0)
Alkaline Phosphatase: 67 U/L (ref 38–126)
Anion gap: 10 (ref 5–15)
BUN: 20 mg/dL (ref 8–23)
CO2: 25 mmol/L (ref 22–32)
Calcium: 9 mg/dL (ref 8.9–10.3)
Chloride: 98 mmol/L (ref 98–111)
Creatinine, Ser: 1.21 mg/dL (ref 0.61–1.24)
GFR, Estimated: >60 mL/min (ref >60)
Glucose, Bld: 120 mg/dL — ABNORMAL HIGH (ref 70–99)
Potassium: 3.9 mmol/L (ref 3.5–5.1)
Sodium: 133 mmol/L — ABNORMAL LOW (ref 135–145)
Total Bilirubin: 0.3 mg/dL (ref 0.3–1.2)
Total Protein: 7.1 g/dL (ref 6.5–8.1)

## 2021-03-22 LAB — CBC WITH DIFFERENTIAL/PLATELET
Abs Immature Granulocytes: 0.67 10*3/uL — ABNORMAL HIGH (ref 0.00–0.07)
Basophils Absolute: 0 10*3/uL (ref 0.0–0.1)
Basophils Relative: 1 %
Eosinophils Absolute: 0.1 10*3/uL (ref 0.0–0.5)
Eosinophils Relative: 1 %
HCT: 27.9 % — ABNORMAL LOW (ref 39.0–52.0)
Hemoglobin: 9.4 g/dL — ABNORMAL LOW (ref 13.0–17.0)
Immature Granulocytes: 9 %
Lymphocytes Relative: 17 %
Lymphs Abs: 1.3 10*3/uL (ref 0.7–4.0)
MCH: 29.3 pg (ref 26.0–34.0)
MCHC: 33.7 g/dL (ref 30.0–36.0)
MCV: 86.9 fL (ref 80.0–100.0)
Monocytes Absolute: 1 10*3/uL (ref 0.1–1.0)
Monocytes Relative: 14 %
Neutro Abs: 4.2 10*3/uL (ref 1.7–7.7)
Neutrophils Relative %: 58 %
Platelets: 142 10*3/uL — ABNORMAL LOW (ref 150–400)
RBC: 3.21 MIL/uL — ABNORMAL LOW (ref 4.22–5.81)
RDW: 13.3 % (ref 11.5–15.5)
Smear Review: NORMAL
WBC: 7.3 10*3/uL (ref 4.0–10.5)
nRBC: 0.3 % — ABNORMAL HIGH (ref 0.0–0.2)

## 2021-03-22 LAB — RESP PANEL BY RT-PCR (FLU A&B, COVID) ARPGX2
Influenza A by PCR: NEGATIVE
Influenza B by PCR: NEGATIVE
SARS Coronavirus 2 by RT PCR: NEGATIVE

## 2021-03-22 LAB — PROTIME-INR
INR: 1.2 (ref 0.8–1.2)
Prothrombin Time: 15.3 seconds — ABNORMAL HIGH (ref 11.4–15.2)

## 2021-03-22 LAB — LIPASE, BLOOD: Lipase: 20 U/L (ref 11–51)

## 2021-03-22 LAB — APTT: aPTT: 42 seconds — ABNORMAL HIGH (ref 24–36)

## 2021-03-22 LAB — LACTIC ACID, PLASMA: Lactic Acid, Venous: 1.4 mmol/L (ref 0.5–1.9)

## 2021-03-22 MED ORDER — MORPHINE SULFATE (PF) 4 MG/ML IV SOLN
4.0000 mg | Freq: Once | INTRAVENOUS | Status: AC
Start: 2021-03-22 — End: 2021-03-22
  Administered 2021-03-22: 4 mg via INTRAVENOUS
  Filled 2021-03-22: qty 1

## 2021-03-22 MED ORDER — ACETAMINOPHEN 325 MG PO TABS
650.0000 mg | ORAL_TABLET | Freq: Four times a day (QID) | ORAL | Status: DC | PRN
  Administered 2021-03-23 – 2021-03-26 (×3): 650 mg via ORAL
  Filled 2021-03-22 (×3): qty 2

## 2021-03-22 MED ORDER — ONDANSETRON HCL 4 MG/2ML IJ SOLN
4.0000 mg | Freq: Once | INTRAMUSCULAR | Status: AC
  Administered 2021-03-22: 4 mg via INTRAVENOUS
  Filled 2021-03-22: qty 2

## 2021-03-22 MED ORDER — MORPHINE SULFATE (PF) 2 MG/ML IV SOLN
2.0000 mg | INTRAVENOUS | Status: DC | PRN
Start: 2021-03-22 — End: 2021-03-23
  Administered 2021-03-22 – 2021-03-23 (×3): 2 mg via INTRAVENOUS
  Filled 2021-03-22 (×3): qty 1

## 2021-03-22 MED ORDER — ALBUTEROL SULFATE (2.5 MG/3ML) 0.083% IN NEBU: 2.5000 mg | INHALATION_SOLUTION | RESPIRATORY_TRACT | Status: DC | PRN

## 2021-03-22 MED ORDER — IBUPROFEN 400 MG PO TABS
400.0000 mg | ORAL_TABLET | Freq: Once | ORAL | Status: AC
  Administered 2021-03-22: 400 mg via ORAL
  Filled 2021-03-22: qty 1

## 2021-03-22 MED ORDER — SODIUM CHLORIDE 0.9 % IV SOLN
2.0000 g | Freq: Three times a day (TID) | INTRAVENOUS | Status: DC
  Administered 2021-03-23 – 2021-03-26 (×11): 2 g via INTRAVENOUS
  Filled 2021-03-22 (×12): qty 2

## 2021-03-22 MED ORDER — ONDANSETRON HCL 4 MG/2ML IJ SOLN
4.0000 mg | Freq: Four times a day (QID) | INTRAMUSCULAR | Status: DC | PRN
  Administered 2021-03-22 – 2021-03-26 (×12): 4 mg via INTRAVENOUS
  Filled 2021-03-22 (×12): qty 2

## 2021-03-22 MED ORDER — BENZONATATE 100 MG PO CAPS
100.0000 mg | ORAL_CAPSULE | Freq: Three times a day (TID) | ORAL | Status: DC
  Administered 2021-03-23 – 2021-03-26 (×11): 100 mg via ORAL
  Filled 2021-03-22 (×11): qty 1

## 2021-03-22 MED ORDER — SODIUM CHLORIDE 0.9 % IV SOLN
2.0000 g | Freq: Once | INTRAVENOUS | Status: AC
  Administered 2021-03-22: 2 g via INTRAVENOUS
  Filled 2021-03-22: qty 2

## 2021-03-22 MED ORDER — ENOXAPARIN SODIUM 40 MG/0.4ML IJ SOSY
40.0000 mg | PREFILLED_SYRINGE | Freq: Every day | INTRAMUSCULAR | Status: DC
  Administered 2021-03-23 – 2021-03-25 (×4): 40 mg via SUBCUTANEOUS
  Filled 2021-03-22 (×4): qty 0.4

## 2021-03-22 MED ORDER — LACTATED RINGERS IV BOLUS (SEPSIS)
1000.0000 mL | Freq: Once | INTRAVENOUS | Status: AC
Start: 2021-03-22 — End: 2021-03-22
  Administered 2021-03-22: 1000 mL via INTRAVENOUS

## 2021-03-22 MED ORDER — MORPHINE SULFATE (PF) 4 MG/ML IV SOLN
4.0000 mg | Freq: Once | INTRAVENOUS | Status: AC
  Administered 2021-03-22: 4 mg via INTRAVENOUS
  Filled 2021-03-22: qty 1

## 2021-03-22 MED ORDER — LACTATED RINGERS IV BOLUS
1000.0000 mL | Freq: Once | INTRAVENOUS | Status: AC
  Administered 2021-03-22: 1000 mL via INTRAVENOUS

## 2021-03-22 NOTE — H&P (Signed)
History and Physical    [name] TKP:[id number] DOB: 02-19-1956 DOA: 03/22/2021  PCP: [name], MD  Patient coming from: [place] ED  I have personally briefly reviewed patient's old medical records in [place]  Chief Complaint: Fever, dyspnea  HPI: [name] is a 65 y.o. 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 fib not on anticoagulation, CKD 3, treated hepatitis C who presents from outside ED for RLL pneumonia.   Pt reports fever of up to 102F at home with chills last night. Had fever again this morning and decided to present to ED. Also notes worsening cough with sputum production.  He chronically has dyspnea but states this has also been worse with exertion.  He also notes significant right lower chest pain worse with palpation and coughing.  Denies any current use of tobacco.  No history of recurrent pneumonia.  ED Course: At outside ED, he was noted to be febrile up to 101.32F and was intermittently hypotensive at 1 point down to systolic of 51Z with improvement following 2 L of LR IV fluids.  He was also tachycardic up to the 110s.  Stable on room air.  Normal WBC.  Hemoglobin of 9.4 with a baseline of 10-11.  Platelet of 142.  Sodium of 133.  Creatinine stable at 1.21.  BG of 120. COVID and flu PCR negative.   Chest x-ray showing likely right lower lobe.  He was started on cefepime and hospitalist was consulted for admission and transfer to [facility].  Review of Systems: Constitutional: No Weight Change, + Fever ENT/Mouth: No sore throat, No Rhinorrhea Eyes: No Eye Pain, No Vision Changes Cardiovascular: No Chest Pain, no SOB, No PND, + Dyspnea on Exertion, No Orthopnea, , No Edema, No Palpitations Respiratory: + Cough, +Sputum, + Wheezing, + Dyspnea  Gastrointestinal: No  Nausea, No Vomiting, No Diarrhea, No Constipation, No Pain Genitourinary: no Urinary Incontinence, No Urgency, No Flank Pain Musculoskeletal: No Arthralgias, No Myalgias Skin: No Skin Lesions, No Pruritus, Neuro: no Weakness, No Numbness Psych: No Anxiety/Panic, No Depression, + decrease appetite Heme/Lymph: No Bruising, No Bleeding  Past Medical History:  Diagnosis Date   Anxiety    Arthritis    Basal cell carcinoma (BCC) of forehead    CAD (coronary artery disease)    a. 10/2015 ant STEMI >> LHC with 3 v CAD; oLAD tx with POBA >> emergent CABG. b. Multiple evals since that time, early graft failure of SVG-RCA by cath 03/2016. c. 2/19 PCI/DES x1 to pRCA, normal EF.   Carotid artery disease ([place])    a. 40-59% BICA 02/2018.   Depression    Dyspnea    Ectopic atrial tachycardia (HCC)    Esophageal reflux    eosinophil esophagitis   Family history of adverse reaction to anesthesia    "sister has PONV" (06/21/2017)   Former tobacco use    Gout    Hepatitis C    "treated and cured" (06/21/2017)   High cholesterol    History of kidney stones    Hypertension    Ischemic cardiomyopathy    a. EF 25-30% at intraop TEE 4/17  //  b. Limited Echo 5/17 - EF 45-50%, mild ant HK. c. EF 55-65% by cath 09/2017.   Migraine    "3-4/yr" (06/21/2017)   Myocardial infarction ([place]) 10/2015   Palpitations  Sinus bradycardia    a. HR dropping into 40s in 02/2016 -> BB reduced.   Stroke [facility]) 10/2016   "small one; sometimes my memory/cognitive issues" (06/21/2017)   Symptomatic hypotension    a. 02/2016 ER visit -> meds reduced.   Syncope    Wears dentures    Wears glasses     Past Surgical History:  Procedure Laterality Date   25 GAUGE PARS PLANA VITRECTOMY WITH 20 GAUGE MVR PORT  12/31/2020   Procedure: 25 GAUGE PARS PLANA VITRECTOMY WITH 20 GAUGE MVR PORT;  Surgeon: [name], MD;  Location: [place];  Service: Thoracic;;   ANTERIOR CERVICAL DECOMP/DISCECTOMY FUSION N/A 10/17/2018    Procedure: Anterior Cervical Decompression Fusion - Cervical seven -Thoracic one;  Surgeon: Consuella Lose, MD;  Location: [place];  Service: Neurosurgery;  Laterality: N/A;   BASAL CELL CARCINOMA EXCISION     "forehead   BIOPSY  07/20/2019   Procedure: BIOPSY;  Surgeon: Carol Ada, MD;  Location: [facility];  Service: Endoscopy;;   BRONCHIAL BIOPSY  12/24/2020   Procedure: BRONCHIAL BIOPSIES;  Surgeon: Garner Nash, DO;  Location: [place] [facility];  Service: Pulmonary;;   BRONCHIAL BRUSHINGS  12/24/2020   Procedure: BRONCHIAL BRUSHINGS;  Surgeon: Garner Nash, DO;  Location: [place];  Service: Pulmonary;;   BRONCHIAL NEEDLE ASPIRATION BIOPSY  12/24/2020   Procedure: BRONCHIAL NEEDLE ASPIRATION BIOPSIES;  Surgeon: Garner Nash, DO;  Location: [place];  Service: Pulmonary;;   CARDIAC CATHETERIZATION N/A 11/28/2015   Procedure: Left Heart Cath and Coronary Angiography;  Surgeon: [name], MD;  Location: [place] [facility];  Service: Cardiovascular;  Laterality: N/A;   CARDIAC CATHETERIZATION N/A 11/28/2015   Procedure: Coronary Balloon Angioplasty;  Surgeon: [name], MD;  Location: [place] [facility];  Service: Cardiovascular;  Laterality: N/A;  ostial LAD   CARDIAC CATHETERIZATION N/A 11/28/2015   Procedure: Coronary/Graft Angiography;  Surgeon: [name], MD;  Location: [place] [facility];  Service: Cardiovascular;  Laterality: N/A;  coronaries only    CARDIAC CATHETERIZATION N/A 04/21/2016   Procedure: Left Heart Cath and Coronary Angiography;  Surgeon: Wellington Hampshire, MD;  Location: [place] [facility];  Service: Cardiovascular;  Laterality: N/A;   CARDIAC CATHETERIZATION N/A 06/14/2016   Procedure: Left Heart Cath and Cors/Grafts Angiography;  Surgeon: Lorretta Harp, MD;  Location: [place] [facility];  Service: Cardiovascular;  Laterality: N/A;   CARDIAC CATHETERIZATION N/A 09/08/2016   Procedure: Left Heart Cath and Cors/Grafts  Angiography;  Surgeon: Wellington Hampshire, MD;  Location: [place] [facility];  Service: Cardiovascular;  Laterality: N/A;   CARDIAC CATHETERIZATION     CORONARY ARTERY BYPASS GRAFT N/A 11/28/2015   Procedure: CORONARY ARTERY BYPASS GRAFTING (CABG) TIMES FIVE USING LEFT INTERNAL MAMMARY ARTERY AND RIGHT GREATER SAPHENOUS,VIEN HARVEATED BY ENDOVIEN, INTRAOPPRATIVE TEE;  Surgeon: Gaye Pollack, MD;  Location: [place];  Service: Open Heart Surgery;  Laterality: N/A;   CORONARY STENT INTERVENTION N/A 10/05/2017   Procedure: CORONARY STENT INTERVENTION;  Surgeon: [name], MD;  Location: [place] [facility];  Service: Cardiovascular;  Laterality: N/A;   ESOPHAGOGASTRODUODENOSCOPY (EGD) WITH PROPOFOL N/A 07/20/2019   Procedure: ESOPHAGOGASTRODUODENOSCOPY (EGD) WITH PROPOFOL;  Surgeon: Carol Ada, MD;  [facility];  Service: Endoscopy;  Laterality: N/A;   ESOPHAGOGASTRODUODENOSCOPY (EGD) WITH PROPOFOL N/A 01/30/2021   Procedure: ESOPHAGOGASTRODUODENOSCOPY (EGD) WITH PROPOFOL;  Surgeon: Carol Ada, MD;  [facility];  Service: Endoscopy;  Laterality: N/A;   FIDUCIAL  MARKER PLACEMENT  12/24/2020   Procedure: FIDUCIAL DYE MARKER PLACEMENT;  Surgeon: Garner Nash, DO;  Location: [place];  Service: Pulmonary;;   HUMERUS SURGERY Right 1969   "tumor inside bone; filled it w/bone chips"   INTERCOSTAL NERVE BLOCK Right 12/24/2020   Procedure: INTERCOSTAL NERVE BLOCK;  Surgeon: [name], MD;  Location: [place];  Service: Thoracic;  Laterality: Right;   IR IMAGING GUIDED PORT INSERTION  02/06/2021   LEFT HEART CATH AND CORS/GRAFTS ANGIOGRAPHY N/A 03/11/2017   Procedure: Left Heart Cath and Cors/Grafts Angiography;  Surgeon: Leonie Man, MD;  Location: [place] [facility];  Service: Cardiovascular;  Laterality: N/A;   LEFT HEART CATH AND CORS/GRAFTS ANGIOGRAPHY N/A 10/05/2017   Procedure: LEFT HEART CATH AND CORS/GRAFTS ANGIOGRAPHY;  Surgeon: [name], MD;   Location: [place] [facility];  Service: Cardiovascular;  Laterality: N/A;   LEFT HEART CATH AND CORS/GRAFTS ANGIOGRAPHY N/A 04/11/2019   Procedure: LEFT HEART CATH AND CORS/GRAFTS ANGIOGRAPHY;  Surgeon: [name], MD;  Location: [place] [facility];  Service: Cardiovascular;  Laterality: N/A;   NODE DISSECTION Right 12/24/2020   Procedure: NODE DISSECTION;  Surgeon: [name], MD;  Location: [place];  Service: Thoracic;  Laterality: Right;   PERIPHERAL VASCULAR CATHETERIZATION N/A 06/14/2016   Procedure: Lower Extremity Angiography;  Surgeon: Lorretta Harp, MD;  Location: [place] [facility];  Service: Cardiovascular;  Laterality: N/A;   VIDEO BRONCHOSCOPY WITH ENDOBRONCHIAL NAVIGATION Right 12/24/2020   Procedure: VIDEO BRONCHOSCOPY WITH ENDOBRONCHIAL NAVIGATION;  Surgeon: Garner Nash, DO;  Location: [place];  Service: Pulmonary;  Laterality: Right;   VIDEO BRONCHOSCOPY WITH ENDOBRONCHIAL ULTRASOUND N/A 12/24/2020   Procedure: VIDEO BRONCHOSCOPY WITH ENDOBRONCHIAL ULTRASOUND;  Surgeon: Garner Nash, DO;  Location: [place];  Service: Pulmonary;  Laterality: N/A;   VIDEO BRONCHOSCOPY WITH INSERTION OF INTERBRONCHIAL VALVE (IBV) N/A 12/31/2020   Procedure: VIDEO BRONCHOSCOPY WITH INSERTION OF INTERBRONCHIAL VALVE (IBV).VALVE IN CARTRIDGE 93mm,9mm. CHEST TUBE PLACEMENT.;  Surgeon: [name], MD;  Location[facility];  Service: Thoracic;  Laterality: N/A;     reports that he quit smoking about 5 years ago. His smoking use included cigarettes. He has a 33.00 pack-year smoking history. He has never used smokeless tobacco. He reports current alcohol use. He reports that he does not use drugs. Social History  Allergies  Allergen Reactions   Prednisone Other (See Comments)    States that this med makes him "crazy"   Tetanus Toxoids Swelling and Other (See Comments)    Fever, Swelling of the arm    Wellbutrin [Bupropion] Other (See Comments)    Crazy thoughts,  nightmares   Indomethacin Other (See Comments)    rectal bleeding   Other     Other reaction(s): Unknown   Varenicline Other (See Comments)    Dreams Other reaction(s): Unknown Other reaction(s): Unknown    Family History  Problem Relation Age of Onset   Lung cancer Mother    Heart Problems Father    Heart attack Father 64   Stroke Father    Heart failure Father    Heart attack Maternal Grandmother    Stroke Maternal Grandmother    Heart attack Paternal Uncle    Hypertension Brother    Autoimmune disease Neg Hx      Prior to Admission medications   Medication Sig Start Date End Date Taking? Authorizing Provider  acetaminophen (TYLENOL) 500 MG tablet Take 1,000 mg by mouth every 6 (six) hours  as needed for moderate pain.    [provider]  albuterol (VENTOLIN HFA) 108 (90 Base) MCG/ACT inhaler Inhale 2 puffs into the lungs every 6 (six) hours as needed for wheezing or shortness of breath. 01/12/21   Garner Nash, DO  amiodarone (PACERONE) 200 MG tablet Take 1 tablet (200 mg total) by mouth daily. 01/07/21   Elgie Collard, PA-C  amLODipine (NORVASC) 10 MG tablet TAKE 1 TABLET(10 MG) BY MOUTH DAILY 12/02/20   [name], MD  aspirin EC 81 MG tablet Take 81 mg by mouth daily. Swallow whole.    [provider]  atorvastatin (LIPITOR) 80 MG tablet TAKE 1 TABLET(80 MG) BY MOUTH DAILY 12/10/19   [name], MD  cloNIDine (CATAPRES) 0.1 MG tablet Take 0.1 mg by mouth at bedtime. 03/12/21   [provider]  clopidogrel (PLAVIX) 75 MG tablet TAKE 1 TABLET BY MOUTH EVERY DAY 02/06/20   [name], MD  Diphenhyd-Hydrocort-Nystatin (FIRST-DUKES MOUTHWASH MT) SWISH AND SPIT OR SWALLOW 5MLS BEFORE MEALS AND AT BEDTIME 03/15/21   [provider]  lidocaine-prilocaine (EMLA) cream Apply 1 application topically once as needed (port access). 02/27/21   Aline August, MD  magnesium oxide (MAG-OX) 400 (240 Mg) MG tablet Take 1 tablet (400  mg total) by mouth daily. 03/03/21   Heilingoetter, Cassandra L, PA-C  metoprolol succinate (TOPROL-XL) 25 MG 24 hr tablet Take 1 tablet (25 mg total) by mouth daily. 08/27/20   [name], MD  nitroGLYCERIN (NITROSTAT) 0.4 MG SL tablet PLACE 1 TABLET UNDER THE TONGUE EVERY 5 MINUTES AS NEEDED FOR CHEST PAIN. 3 DOSES MAX 04/16/20   [name], MD  ondansetron (ZOFRAN) 8 MG tablet Take 1 tablet (8 mg total) by mouth every 8 (eight) hours as needed for nausea or vomiting. Starting 3 days after chemotherapy 02/13/21   Heilingoetter, Cassandra L, PA-C  oxyCODONE-acetaminophen (PERCOCET/ROXICET) 5-325 MG tablet Take 1 tablet by mouth every 4 (four) hours as needed for severe pain. 03/03/21   Curt Bears, MD  prochlorperazine (COMPAZINE) 10 MG tablet Take 1 tablet (10 mg total) by mouth every 6 (six) hours as needed. 01/14/21   Heilingoetter, Cassandra L, PA-C  ranolazine (RANEXA) 1000 MG SR tablet Take 1 tablet (1,000 mg total) by mouth 2 (two) times daily. 11/08/19   [name], NP  Tiotropium Bromide-Olodaterol (STIOLTO RESPIMAT) 2.5-2.5 MCG/ACT AERS Inhale 2 puffs into the lungs daily. 01/12/21   Garner Nash, DO    Physical Exam: Vitals:   03/22/21 2000 03/22/21 2105 03/22/21 2130 03/22/21 2251  BP: 108/65  121/75 125/65  Pulse: 91  96 (!) 110  Resp: (!) 24  17 16   Temp: 98.4 F (36.9 C) 100 F (37.8 C)  (!) 100.8 F (38.2 C)  TempSrc: Oral Oral  Oral  SpO2: 100%  97% 99%  Weight:      Height:        Constitutional: NAD, calm, comfortable, nontoxic appearing elderly male appearing younger than stated age laying flat in bed Vitals:   03/22/21 2000 03/22/21 2105 03/22/21 2130 03/22/21 2251  BP: 108/65  121/75 125/65  Pulse: 91  96 (!) 110  Resp: (!) 24  17 16   Temp: 98.4 F (36.9 C) 100 F (37.8 C)  (!) 100.8 F (38.2 C)  TempSrc: Oral Oral  Oral  SpO2: 100%  97% 99%  Weight:      Height:       Eyes: PERRL, lids and conjunctivae  normal ENMT: Mucous  membranes are moist.  Neck: normal, supple Respiratory: Decreased right lower lobe breath sounds, no wheezing, no crackles. Normal respiratory effort. No accessory muscle use.  Cardiovascular: Regular rate and rhythm, no murmurs / rubs / gallops. No extremity edema. 2+ pedal pulses. No carotid bruits.  Abdomen: Pain and guarding with palpation of the right lower rib cage, no masses palpated.. Bowel sounds positive.  Musculoskeletal: no clubbing / cyanosis. No joint deformity upper and lower extremities. Good ROM, no contractures. Normal muscle tone.  Skin: no rashes, lesions, ulcers. No induration Neurologic: CN 2-12 grossly intact. Sensation intact,  Strength 5/5 in all 4.  Psychiatric: Normal judgment and insight. Alert and oriented x 3. Normal mood.     Labs on Admission: I have personally reviewed following labs and imaging studies  CBC: Recent Labs  Lab 03/16/21 0843 03/22/21 1224  WBC 11.8* 7.3  NEUTROABS 10.2* 4.2  HGB 11.6* 9.4*  HCT 33.8* 27.9*  MCV 87.1 86.9  PLT 333 258*   Basic Metabolic Panel: Recent Labs  Lab 03/16/21 0843 03/22/21 1224  NA 136 133*  K 4.3 3.9  CL 102 98  CO2 23 25  GLUCOSE 110* 120*  BUN 31* 20  CREATININE 1.19 1.21  CALCIUM 9.7 9.0  MG 1.8  --    GFR: Estimated Creatinine Clearance: 60.9 mL/min (by C-G formula based on SCr of 1.21 mg/dL). Liver Function Tests: Recent Labs  Lab 03/16/21 0843 03/22/21 1224  AST 14* 16  ALT 16 12  ALKPHOS 80 67  BILITOT 1.1 0.3  PROT 7.2 7.1  ALBUMIN 3.7 3.2*   Recent Labs  Lab 03/22/21 1224  LIPASE 20   No results for input(s): AMMONIA in the last 168 hours. Coagulation Profile: Recent Labs  Lab 03/22/21 1224  INR 1.2   Cardiac Enzymes: No results for input(s): CKTOTAL, CKMB, CKMBINDEX, TROPONINI in the last 168 hours. BNP (last 3 results) No results for input(s): PROBNP in the last 8760 hours. HbA1C: No results for input(s): HGBA1C in the last 72 hours. CBG: No results for  input(s): GLUCAP in the last 168 hours. Lipid Profile: No results for input(s): CHOL, HDL, LDLCALC, TRIG, CHOLHDL, LDLDIRECT in the last 72 hours. Thyroid Function Tests: No results for input(s): TSH, T4TOTAL, FREET4, T3FREE, THYROIDAB in the last 72 hours. Anemia Panel: No results for input(s): VITAMINB12, FOLATE, FERRITIN, TIBC, IRON, RETICCTPCT in the last 72 hours. Urine analysis:    Component Value Date/Time   COLORURINE YELLOW 03/22/2021 1836   APPEARANCEUR CLEAR 03/22/2021 1836   LABSPEC <1.005 (L) 03/22/2021 1836   PHURINE 6.0 03/22/2021 1836   GLUCOSEU NEGATIVE 03/22/2021 1836   HGBUR NEGATIVE 03/22/2021 1836   BILIRUBINUR NEGATIVE 03/22/2021 1836   KETONESUR NEGATIVE 03/22/2021 1836   PROTEINUR NEGATIVE 03/22/2021 1836   UROBILINOGEN 0.2 05/13/2014 0658   NITRITE NEGATIVE 03/22/2021 1836   LEUKOCYTESUR NEGATIVE 03/22/2021 1836    Radiological Exams on Admission: DG Chest Port 1 View  Result Date: 03/22/2021 CLINICAL DATA:  Fever and chills starting last night. Ongoing chemotherapy for lung cancer. EXAM: PORTABLE CHEST 1 VIEW COMPARISON:  02/28/2021 FINDINGS: Fall injectable right Port-A-Cath tip: Right atrium. Prior CABG. Prior right upper lobectomy with expected volume loss in the right hemithorax. Lower cervical plate and screw fixator. Borderline enlargement of the cardiopericardial silhouette, without edema. There is some mild blunting of the right lateral costophrenic angle along with some progressive density at the right lung base compared to 02/28/2021; some of this may be from  the patient's known pleural effusion but a component of early pneumonia is difficult to exclude given the patient's symptoms. IMPRESSION: 1. Increased density at the right lung base. A component of this may be due to the patient's small right pleural effusion, but early pneumonia at the right lung base is difficult to exclude given the change from 02/28/2021. 2. Prior right upper lobectomy. 3.  Borderline enlargement of the cardiopericardial silhouette, without edema. 4. Electronically Signed   By: Van Clines M.D.   On: 03/22/2021 12:17      Assessment/Plan  Community acquired pneumonia w/hx of neuroendocrine carcinoma of the right lung s/p RUL lobectomy  -continue cefepime -Legionella and strep pneumo urine antigen pending  Sepsis secondary to community-acquired pneumonia -Presented with fever, tachycardia -Continue IV antibiotic treatment as above  Hypotension - Resolved with 2 L of IV LR fluid resuscitation - Hold home antihypertensives overnight and monitor  CAD status post CABG -stable. Chest pain is pleuric in nature from pneumonia.  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 [name] of oncology and Dr. Kipp Brood with CT surg   DVT prophylaxis:.Lovenox Code Status: Full Family Communication: Plan discussed with patient at bedside  disposition Plan: Home with observation Consults called:  Admission status: Observation  Level of care: Med-Surg  Status is: Observation  The patient remains OBS appropriate and will d/c before 2 midnights.  Dispo: The patient is from: Home              Anticipated d/c is to: Home              Patient currently is not medically stable to d/c.   Difficult to place patient No         Orene Desanctis DO [facility]   If 7PM-7AM, please contact night-coverage [email or web address]   03/22/2021, 11:03 PM

## 2021-03-22 NOTE — ED Triage Notes (Signed)
PT reports fever and chills since last pm; currently receiving chemo for lung cancer; had tylenol at 2 am

## 2021-03-22 NOTE — ED Notes (Signed)
Crackers & ginger ale given to pt

## 2021-03-22 NOTE — ED Provider Notes (Signed)
[facility] Provider Note   CSN: [id number] Arrival date & time: 03/22/21  1126     History Chief Complaint  Patient presents with   Fever    Anthony QUEBEDEAUX is a 65 y.o. male currently undergoing chemotherapy for lung cancer (currently 9 days out from second round of chemo) who presents today with fever with T-max of 101.6 F at home as well as right upper abdominal pain and chills.  States that he started feeling poorly 48 hours ago but developed worsening fever and chills last night and promptly returned to the emergency department today.  I personally reviewed the patient's medical records.  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   HPI     Past Medical History:  Diagnosis Date   Anxiety    Arthritis    Basal cell carcinoma (BCC) of forehead    CAD (coronary artery disease)    a. 10/2015 ant STEMI >> LHC with 3 v CAD; oLAD tx with POBA >> emergent CABG. b. Multiple evals since that time, early graft failure of SVG-RCA by cath 03/2016. c. 2/19 PCI/DES x1 to pRCA, normal EF.   Carotid artery disease ([place])    a. 40-59% BICA 02/2018.   Depression    Dyspnea    Ectopic atrial tachycardia (HCC)    Esophageal reflux    eosinophil esophagitis   Family history of adverse reaction to anesthesia    "sister has PONV" (06/21/2017)   Former tobacco use    Gout    Hepatitis C    "treated and cured" (06/21/2017)   High cholesterol    History of kidney stones    Hypertension    Ischemic cardiomyopathy    a. EF 25-30% at intraop TEE 4/17  //  b. Limited Echo 5/17 - EF 45-50%, mild ant HK. c. EF 55-65% by cath 09/2017.   Migraine    "3-4/yr" (06/21/2017)   Myocardial infarction ([place]) 10/2015   Palpitations    Sinus bradycardia    a. HR dropping into 40s in 02/2016 -> BB reduced.    Stroke [facility]) 10/2016   "small one; sometimes my memory/cognitive issues" (06/21/2017)   Symptomatic hypotension    a. 02/2016 ER visit -> meds reduced.   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[place]) 01/30/2021   Encounter for antineoplastic chemotherapy 01/29/2021   Goals of care, counseling/discussion 01/14/2021   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[place]) 01/14/2021   Acute back pain with sciatica 01/12/2021   Allergic rhinitis 01/12/2021   Amnesia 01/12/2021   Kidney stone 01/12/2021   Sciatica 01/12/2021   Bipolar disorder ([place]) 01/12/2021   S/P lobectomy of lung 12/24/2020   Solitary lung nodule 11/28/2020   Chest pain 11/07/2019   Gastroesophageal reflux disease    Cervical radiculopathy 10/17/2018  Preoperative clearance 10/04/2018   Palpitations    Coronary artery disease involving coronary bypass graft of native heart with angina pectoris ([place])    Transient loss of consciousness 03/24/2018   Ectopic atrial tachycardia ([place]) 02/09/2018   Central chest pain 03/10/2017   Family hx-stroke 11/10/2016   Stroke-like episode ([place]) - R brain, s/p tPA 11/09/2016   Unstable angina ([place]) 09/07/2016   Claudication of both lower extremities ([place])    Pure hypercholesterolemia    Tobacco abuse disorder    CAD of autologous artery bypass graft without angina    Chest pain at rest 06/10/2016   Abnormal nuclear stress test - HIGH RISK 04/20/2016   Old MI (myocardial  infarction)    Essential hypertension 02/26/2016   Ischemic cardiomyopathy 12/25/2015   Hyperlipidemia LDL goal <70 12/25/2015   Mild tobacco abuse in early remission 11/28/2015   Coronary artery disease involving native coronary artery of native heart with angina pectoris ([place]) 11/28/2015   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   Acute MI anterior wall first episode care [facility])    Precordial chest pain 03/07/2015   Gout attack 03/07/2015   Mixed bipolar I disorder ([place]) 03/07/2015   Fibromyalgia 07/09/2014   Gout 07/09/2014   Anxiety 07/09/2014   Depression 07/09/2014   Hepatitis C 24/40/1027   Eosinophilic esophagitis 25/36/6440    Past Surgical History:  Procedure Laterality Date   25 GAUGE PARS PLANA VITRECTOMY WITH 20 GAUGE MVR PORT  12/31/2020   Procedure: 25 GAUGE PARS PLANA VITRECTOMY WITH 20 GAUGE MVR PORT;  Surgeon: [name], MD;  [facility]: [facility];  Service: Thoracic;;   ANTERIOR CERVICAL DECOMP/DISCECTOMY FUSION N/A 10/17/2018   Procedure: Anterior Cervical Decompression Fusion - Cervical seven -Thoracic one;  Surgeon: Consuella Lose, MD;  Location: [place];  Service: Neurosurgery;  Laterality: N/A;   BASAL CELL CARCINOMA EXCISION     "forehead   BIOPSY  07/20/2019   Procedure: BIOPSY;  Surgeon: Carol Ada, MD;  [facility];  Service: Endoscopy;;   BRONCHIAL BIOPSY  12/24/2020   Procedure: BRONCHIAL BIOPSIES;  Surgeon: Garner Nash, DO;  Location: [place] [facility];  Service: Pulmonary;;   BRONCHIAL BRUSHINGS  12/24/2020   Procedure: BRONCHIAL BRUSHINGS;  Surgeon: Garner Nash, DO;  Location: [place];  Service: Pulmonary;;   BRONCHIAL NEEDLE ASPIRATION BIOPSY  12/24/2020   Procedure: BRONCHIAL NEEDLE ASPIRATION BIOPSIES;  Surgeon: Garner Nash, DO;  Location: [place];  Service: Pulmonary;;   CARDIAC CATHETERIZATION N/A 11/28/2015   Procedure: Left Heart Cath and Coronary Angiography;  Surgeon: [name], MD;  Location: [place] CV LAB;   Service: Cardiovascular;  Laterality: N/A;   CARDIAC CATHETERIZATION N/A 11/28/2015   Procedure: Coronary Balloon Angioplasty;  Surgeon: [name], MD;  Location: [place] CV LAB;  Service: Cardiovascular;  Laterality: N/A;  ostial LAD   CARDIAC CATHETERIZATION N/A 11/28/2015   Procedure: Coronary/Graft Angiography;  Surgeon: [name], MD;  Location: [place] [facility] LAB;  Service: Cardiovascular;  Laterality: N/A;  coronaries only    CARDIAC CATHETERIZATION N/A 04/21/2016   Procedure: Left Heart Cath and Coronary Angiography;  Surgeon: Wellington Hampshire, MD;  Location: [place] [facility];  Service: Cardiovascular;  Laterality: N/A;   CARDIAC CATHETERIZATION N/A 06/14/2016   Procedure: Left Heart Cath and Cors/Grafts Angiography;  Surgeon: Lorretta Harp, MD;  Location: [place] [facility];  Service: Cardiovascular;  Laterality: N/A;   CARDIAC CATHETERIZATION N/A 09/08/2016   Procedure: Left Heart Cath and  Cors/Grafts Angiography;  Surgeon: Wellington Hampshire, MD;  Location: [place] [facility];  Service: Cardiovascular;  Laterality: N/A;   CARDIAC CATHETERIZATION     CORONARY ARTERY BYPASS GRAFT N/A 11/28/2015   Procedure: CORONARY ARTERY BYPASS GRAFTING (CABG) TIMES FIVE USING LEFT INTERNAL MAMMARY ARTERY AND RIGHT GREATER SAPHENOUS,VIEN HARVEATED BY ENDOVIEN, INTRAOPPRATIVE TEE;  Surgeon: Gaye Pollack, MD;  Location: [place];  Service: Open Heart Surgery;  Laterality: N/A;   CORONARY STENT INTERVENTION N/A 10/05/2017   Procedure: CORONARY STENT INTERVENTION;  Surgeon: [name], MD;  Location: [place] [facility];  Service: Cardiovascular;  Laterality: N/A;   ESOPHAGOGASTRODUODENOSCOPY (EGD) WITH PROPOFOL N/A 07/20/2019   Procedure: ESOPHAGOGASTRODUODENOSCOPY (EGD) WITH PROPOFOL;  Surgeon: Carol Ada, MD;  [facility];  Service: Endoscopy;  Laterality: N/A;   ESOPHAGOGASTRODUODENOSCOPY (EGD) WITH PROPOFOL N/A 01/30/2021   Procedure: ESOPHAGOGASTRODUODENOSCOPY  (EGD) WITH PROPOFOL;  Surgeon: Carol Ada, MD;  [facility];  Service: Endoscopy;  Laterality: N/A;   FIDUCIAL MARKER PLACEMENT  12/24/2020   Procedure: FIDUCIAL DYE MARKER PLACEMENT;  Surgeon: Garner Nash, DO;  Location: [place] [facility];  Service: Pulmonary;;   HUMERUS SURGERY Right 1969   "tumor inside bone; filled it w/bone chips"   INTERCOSTAL NERVE BLOCK Right 12/24/2020   Procedure: INTERCOSTAL NERVE BLOCK;  Surgeon: [name], MD;  Location: [place];  Service: Thoracic;  Laterality: Right;   IR IMAGING GUIDED PORT INSERTION  02/06/2021   LEFT HEART CATH AND CORS/GRAFTS ANGIOGRAPHY N/A 03/11/2017   Procedure: Left Heart Cath and Cors/Grafts Angiography;  Surgeon: Leonie Man, MD;  Location: [place] [facility];  Service: Cardiovascular;  Laterality: N/A;   LEFT HEART CATH AND CORS/GRAFTS ANGIOGRAPHY N/A 10/05/2017   Procedure: LEFT HEART CATH AND CORS/GRAFTS ANGIOGRAPHY;  Surgeon: [name], MD;  Location: [place] [facility];  Service: Cardiovascular;  Laterality: N/A;   LEFT HEART CATH AND CORS/GRAFTS ANGIOGRAPHY N/A 04/11/2019   Procedure: LEFT HEART CATH AND CORS/GRAFTS ANGIOGRAPHY;  Surgeon: [name], MD;  Location: [place] [facility];  Service: Cardiovascular;  Laterality: N/A;   NODE DISSECTION Right 12/24/2020   Procedure: NODE DISSECTION;  Surgeon: [name], MD;  Location: [place];  Service: Thoracic;  Laterality: Right;   PERIPHERAL VASCULAR CATHETERIZATION N/A 06/14/2016   Procedure: Lower Extremity Angiography;  Surgeon: Lorretta Harp, MD;  Location: [place] [facility];  Service: Cardiovascular;  Laterality: N/A;   VIDEO BRONCHOSCOPY WITH ENDOBRONCHIAL NAVIGATION Right 12/24/2020   Procedure: VIDEO BRONCHOSCOPY WITH ENDOBRONCHIAL NAVIGATION;  Surgeon: Garner Nash, DO;  Location: [place];  Service: Pulmonary;  Laterality: Right;   VIDEO BRONCHOSCOPY WITH ENDOBRONCHIAL ULTRASOUND N/A 12/24/2020   Procedure: VIDEO  BRONCHOSCOPY WITH ENDOBRONCHIAL ULTRASOUND;  Surgeon: Garner Nash, DO;  Location: [place];  Service: Pulmonary;  Laterality: N/A;   VIDEO BRONCHOSCOPY WITH INSERTION OF INTERBRONCHIAL VALVE (IBV) N/A 12/31/2020   Procedure: VIDEO BRONCHOSCOPY WITH INSERTION OF INTERBRONCHIAL VALVE (IBV).VALVE IN CARTRIDGE 82mm,9mm. CHEST TUBE PLACEMENT.;  Surgeon: [name], MD;  [facility];  Service: Thoracic;  Laterality: N/A;       Family History  Problem Relation Age of Onset   Lung cancer Mother    Heart Problems Father    Heart attack Father 81   Stroke Father    Heart failure Father    Heart attack Maternal Grandmother    Stroke Maternal Grandmother    Heart attack Paternal Uncle    Hypertension Brother    Autoimmune  disease Neg Hx     Social History   Tobacco Use   Smoking status: Former    Packs/day: 0.75    Years: 44.00    Pack years: 33.00    Types: Cigarettes    Quit date: 11/28/2015    Years since quitting: 5.3   Smokeless tobacco: Never  Vaping Use   Vaping Use: Never used  Substance Use Topics   Alcohol use: Yes    Comment: occ   Drug use: No    Comment: 06/21/2017 "nothing since the 1980s"    Home Medications Prior to Admission medications   Medication Sig Start Date End Date Taking? Authorizing Provider  acetaminophen (TYLENOL) 500 MG tablet Take 1,000 mg by mouth every 6 (six) hours as needed for moderate pain.    [provider]  albuterol (VENTOLIN HFA) 108 (90 Base) MCG/ACT inhaler Inhale 2 puffs into the lungs every 6 (six) hours as needed for wheezing or shortness of breath. 01/12/21   Garner Nash, DO  amiodarone (PACERONE) 200 MG tablet Take 1 tablet (200 mg total) by mouth daily. 01/07/21   Elgie Collard, PA-C  amLODipine (NORVASC) 10 MG tablet TAKE 1 TABLET(10 MG) BY MOUTH DAILY 12/02/20   [name], MD  aspirin EC 81 MG tablet Take 81 mg by mouth daily. Swallow whole.    [provider]  atorvastatin  (LIPITOR) 80 MG tablet TAKE 1 TABLET(80 MG) BY MOUTH DAILY 12/10/19   [name], MD  cloNIDine (CATAPRES) 0.1 MG tablet Take 0.1 mg by mouth at bedtime. 03/12/21   [provider]  clopidogrel (PLAVIX) 75 MG tablet TAKE 1 TABLET BY MOUTH EVERY DAY 02/06/20   [name], MD  Diphenhyd-Hydrocort-Nystatin (FIRST-DUKES MOUTHWASH MT) SWISH AND SPIT OR SWALLOW 5MLS BEFORE MEALS AND AT BEDTIME 03/15/21   [provider]  lidocaine-prilocaine (EMLA) cream Apply 1 application topically once as needed (port access). 02/27/21   Aline August, MD  magnesium oxide (MAG-OX) 400 (240 Mg) MG tablet Take 1 tablet (400 mg total) by mouth daily. 03/03/21   Heilingoetter, Cassandra L, PA-C  metoprolol succinate (TOPROL-XL) 25 MG 24 hr tablet Take 1 tablet (25 mg total) by mouth daily. 08/27/20   [name], MD  nitroGLYCERIN (NITROSTAT) 0.4 MG SL tablet PLACE 1 TABLET UNDER THE TONGUE EVERY 5 MINUTES AS NEEDED FOR CHEST PAIN. 3 DOSES MAX 04/16/20   [name], MD  ondansetron (ZOFRAN) 8 MG tablet Take 1 tablet (8 mg total) by mouth every 8 (eight) hours as needed for nausea or vomiting. Starting 3 days after chemotherapy 02/13/21   Heilingoetter, Cassandra L, PA-C  oxyCODONE-acetaminophen (PERCOCET/ROXICET) 5-325 MG tablet Take 1 tablet by mouth every 4 (four) hours as needed for severe pain. 03/03/21   Curt Bears, MD  prochlorperazine (COMPAZINE) 10 MG tablet Take 1 tablet (10 mg total) by mouth every 6 (six) hours as needed. 01/14/21   Heilingoetter, Cassandra L, PA-C  ranolazine (RANEXA) 1000 MG SR tablet Take 1 tablet (1,000 mg total) by mouth 2 (two) times daily. 11/08/19   [name], NP  Tiotropium Bromide-Olodaterol (STIOLTO RESPIMAT) 2.5-2.5 MCG/ACT AERS Inhale 2 puffs into the lungs daily. 01/12/21   Garner Nash, DO    Allergies    Prednisone, Tetanus toxoids, Wellbutrin [bupropion], Indomethacin, Other, and Varenicline  Review of Systems    Review of Systems  Constitutional:  Positive for activity change, appetite change, chills, diaphoresis, fatigue and fever.  HENT: Negative.  Eyes: Negative.   Respiratory:  Positive for cough, chest tightness and shortness of breath.   Cardiovascular:  Positive for palpitations. Negative for chest pain and leg swelling.  Gastrointestinal:  Positive for abdominal pain and nausea. Negative for blood in stool, constipation, diarrhea and vomiting.  Genitourinary: Negative.   Musculoskeletal: Negative.   Skin:  Positive for pallor.  Allergic/Immunologic: Positive for immunocompromised state.  Neurological:  Positive for weakness. Negative for dizziness, tremors, syncope, light-headedness, numbness and headaches.   Physical Exam Updated Vital Signs BP (!) 101/54 (BP Location: Right Arm)   Pulse 92   Temp 99.5 F (37.5 C) (Oral)   Resp (!) 24   Ht 5\' 9"  (1.753 m)   Wt 81.6 kg   SpO2 98%   BMI 26.58 kg/m   Physical Exam Vitals and nursing note reviewed.  Constitutional:      Appearance: He is overweight. He is ill-appearing. He is not toxic-appearing.  HENT:     Head: Normocephalic and atraumatic.     Nose: Nose normal.     Mouth/Throat:     Mouth: Mucous membranes are dry.     Pharynx: Oropharynx is clear. Uvula midline. No oropharyngeal exudate or posterior oropharyngeal erythema.     Tonsils: No tonsillar exudate.  Eyes:     General: Lids are normal. Vision grossly intact.        Right eye: No discharge.        Left eye: No discharge.     Extraocular Movements: Extraocular movements intact.     Conjunctiva/sclera: Conjunctivae normal.     Pupils: Pupils are equal, round, and reactive to light.  Neck:     Trachea: Trachea and phonation normal.  Cardiovascular:     Rate and Rhythm: Normal rate and regular rhythm.     Pulses: Normal pulses.     Heart sounds: Normal heart sounds. No murmur heard. Pulmonary:     Effort: Pulmonary effort is normal. Tachypnea present. No  bradypnea, accessory muscle usage, prolonged expiration or respiratory distress.     Breath sounds: Examination of the right-middle field reveals rhonchi. Examination of the right-lower field reveals decreased breath sounds. Decreased breath sounds and rhonchi present. No wheezing or rales.  Chest:     Chest wall: No mass, lacerations, deformity, swelling, tenderness, crepitus or edema.    Abdominal:     General: Bowel sounds are normal. There is no distension.     Palpations: Abdomen is soft.     Tenderness: There is no right CVA tenderness, left CVA tenderness, guarding or rebound.  Musculoskeletal:        General: No deformity.     Cervical back: Normal range of motion and neck supple. No edema, rigidity or crepitus. No pain with movement, spinous process tenderness or muscular tenderness.     Right lower leg: No edema.     Left lower leg: No edema.  Lymphadenopathy:     Cervical: No cervical adenopathy.  Skin:    General: Skin is warm and dry.     Capillary Refill: Capillary refill takes less than 2 seconds.     Findings: No rash.  Neurological:     Mental Status: He is alert. Mental status is at baseline.  Psychiatric:        Mood and Affect: Mood normal.    ED Results / Procedures / Treatments   Labs (all labs ordered are listed, but only abnormal results are displayed) Labs Reviewed  COMPREHENSIVE METABOLIC PANEL - Abnormal; Notable  for the following components:      Result Value   Sodium 133 (*)    Glucose, Bld 120 (*)    Albumin 3.2 (*)    All other components within normal limits  CBC WITH DIFFERENTIAL/PLATELET - Abnormal; Notable for the following components:   RBC 3.21 (*)    Hemoglobin 9.4 (*)    HCT 27.9 (*)    Platelets 142 (*)    nRBC 0.3 (*)    All other components within normal limits  PROTIME-INR - Abnormal; Notable for the following components:   Prothrombin Time 15.3 (*)    All other components within normal limits  APTT - Abnormal; Notable for the  following components:   aPTT 42 (*)    All other components within normal limits  URINE CULTURE  CULTURE, BLOOD (ROUTINE X 2)  CULTURE, BLOOD (ROUTINE X 2)  RESP PANEL BY RT-PCR (FLU A&B, COVID) ARPGX2  LACTIC ACID, PLASMA  LIPASE, BLOOD  URINALYSIS, ROUTINE W REFLEX MICROSCOPIC    EKG EKG: sinus tachycardia, no STEMI.    Radiology DG Chest Port 1 View  Result Date: 03/22/2021 CLINICAL DATA:  Fever and chills starting last night. Ongoing chemotherapy for lung cancer. EXAM: PORTABLE CHEST 1 VIEW COMPARISON:  02/28/2021 FINDINGS: Fall injectable right Port-A-Cath tip: Right atrium. Prior CABG. Prior right upper lobectomy with expected volume loss in the right hemithorax. Lower cervical plate and screw fixator. Borderline enlargement of the cardiopericardial silhouette, without edema. There is some mild blunting of the right lateral costophrenic angle along with some progressive density at the right lung base compared to 02/28/2021; some of this may be from the patient's known pleural effusion but a component of early pneumonia is difficult to exclude given the patient's symptoms. IMPRESSION: 1. Increased density at the right lung base. A component of this may be due to the patient's small right pleural effusion, but early pneumonia at the right lung base is difficult to exclude given the change from 02/28/2021. 2. Prior right upper lobectomy. 3. Borderline enlargement of the cardiopericardial silhouette, without edema. 4. Electronically Signed   By: Van Clines M.D.   On: 03/22/2021 12:17    Procedures Procedures   Medications Ordered in ED Medications  morphine 4 MG/ML injection 4 mg (has no administration in time range)  ondansetron (ZOFRAN) injection 4 mg (has no administration in time range)  lactated ringers bolus 1,000 mL (0 mLs Intravenous Stopped 03/22/21 1351)  ceFEPIme (MAXIPIME) 2 g in sodium chloride 0.9 % 100 mL IVPB (0 g Intravenous Stopped 03/22/21 1318)  morphine 4  MG/ML injection 4 mg (4 mg Intravenous Given 03/22/21 1234)  ondansetron (ZOFRAN) injection 4 mg (4 mg Intravenous Given 03/22/21 1235)  ibuprofen (ADVIL) tablet 400 mg (400 mg Oral Given 03/22/21 1236)    ED Course  I have reviewed the triage vital signs and the nursing notes.  Pertinent labs & imaging results that were available during my care of the patient were reviewed by me and considered in my medical decision making (see chart for details).  Clinical Course as of 03/22/21 1425  Sun Mar 22, 2432  [id number] 65 year old male history of lung cancer on active chemotherapy here with fever body aches.  Lab work showing normal white count low hemoglobin likely related to chemo.  Getting antibiotics chest x-ray.  Likely need admission to the hospital for further work-up. [MB]  1007 Consult to hospitalist, Dr. Marylyn Ishihara, who is agreeable to admitting this patient to his service. I appreciate his  collaboration in the care of this patient.  [RS]    Clinical Course User Index [MB] Hayden Rasmussen, MD [RS] Domitila Stetler, Sharlene Dory   MDM Rules/Calculators/A&P                         65 year old male who is currently undergoing chemotherapy for lung cancer presents with fever and right upper abdominal pain.  Concern for febrile neutropenia versus sepsis.  Tachycardic and tachypneic on intake, febrile to 101.1 F.  Cardiac exam with persistent tachycardia, pulmonary exam with tachypnea and diminished breath sounds in the right lung base as well as rhonchi in the right mid lung.  Abdominal exam significant for epigastric and right upper quadrant tenderness palpation without guarding or rebound.  There is no lower extremity edema.  We will proceed with broad work-up for sepsis as well as cefepime for febrile neutropenia.  Patient will likely require admission to the hospital.  Antiemetic and analgesia offered as well, ibuprofen for fever (patient had nearly 5000 mg of tylenol yesterday).  On Granix. CBC  with WBC of 7, no neutropenic at this time.  Anemic with hemoglobin of 9.4 likely secondary to chemotherapy.  CMP with mild hyponatremia of 133, INR is normal and lipase is normal.  Chest x-ray with possible new right lower lobe pneumonia.  EKG reassuring though he is tachycardic.  Patient reevaluated, improvement tachycardia following fluid bolus.  Currently receiving IV cefepime as ordered with concern for febrile neutropenia, however not neutropenic at this time.  Do feel patient would benefit from mission to the hospital given fever in context of new compromised state.  Simona Huh voiced understanding with medical evaluation and treatment plan.  Patient's questions was answered to his expressed satisfaction.  He is amenable to plan for admission at this time.  Consult to hospitalist as above.  Patient will be transferred to [facility] for admission.  This chart was dictated using voice recognition software, Dragon. Despite the best efforts of this provider to proofread and correct errors, errors may still occur which can change documentation meaning.  Final Clinical Impression(s) / ED Diagnoses Final diagnoses:  None    Rx / DC Orders ED Discharge Orders     None        Aura Dials 03/22/21 1425    Hayden Rasmussen, MD 03/23/21 1149

## 2021-03-22 NOTE — ED Notes (Signed)
Blood Cultures x 2 obtained prior to Abx administration

## 2021-03-22 NOTE — ED Notes (Addendum)
Pt continues to c/o cough, meds ordered Pt unable to void at this time

## 2021-03-22 NOTE — Progress Notes (Signed)
Notified by EDP of need for admission d/t [facility] PNA. [facility] accepts patient to med-surg at [facility]. EDP is to remain responsible for orders/medical decisions while patient is holding at [facility]. Upon arrival to [facility], [facility] will assume care. Nursing staff will call patient placement to notify them of patient's arrival so that the proper [facility] member may receive the patient. Thank you.

## 2021-03-22 NOTE — Progress Notes (Signed)
Pharmacy Antibiotic Note  [name] is a 65 y.o. male admitted on 03/22/2021 with pneumonia.  Pharmacy has been consulted for Cefepime dosing.  Plan: Cefepime 2gm IV q8h No dose adjustments anticipated- pharmacy will sign off and monitor peripherally via electronic surveillance software for renal changes and new cx data. Please re-consult if needed.   Height: 5\' 9"  (175.3 cm) Weight: 81.6 kg (180 lb) IBW/kg (Calculated) : 70.7  Temp (24hrs), Avg:99.7 F (37.6 C), Min:98.4 F (36.9 C), Max:101.1 F (38.4 C)  Recent Labs  Lab 03/16/21 0843 03/22/21 1223 03/22/21 1224  WBC 11.8*  --  7.3  CREATININE 1.19  --  1.21  LATICACIDVEN  --  1.4  --     Estimated Creatinine Clearance: 60.9 mL/min (by C-G formula based on SCr of 1.21 mg/dL).    Allergies  Allergen Reactions   Prednisone Other (See Comments)    States that this med makes him "crazy"   Tetanus Toxoids Swelling and Other (See Comments)    Fever, Swelling of the arm    Wellbutrin [Bupropion] Other (See Comments)    Crazy thoughts, nightmares   Indomethacin Other (See Comments)    rectal bleeding   Other     Other reaction(s): Unknown   Varenicline Other (See Comments)    Dreams Other reaction(s): Unknown Other reaction(s): Unknown    Thank you for allowing pharmacy to be a part of this patient's care.  Netta Cedars PharmD 03/22/2021 10:59 PM

## 2021-03-22 NOTE — ED Notes (Signed)
Lab notified to add urine culture

## 2021-03-23 ENCOUNTER — Inpatient Hospital Stay: Payer: Medicare Other

## 2021-03-23 ENCOUNTER — Encounter (HOSPITAL_COMMUNITY): Payer: Self-pay | Admitting: Internal Medicine

## 2021-03-23 DIAGNOSIS — B370 Candidal stomatitis: Secondary | ICD-10-CM | POA: Diagnosis not present

## 2021-03-23 DIAGNOSIS — I252 Old myocardial infarction: Secondary | ICD-10-CM | POA: Diagnosis not present

## 2021-03-23 DIAGNOSIS — Z20822 Contact with and (suspected) exposure to covid-19: Secondary | ICD-10-CM | POA: Diagnosis present

## 2021-03-23 DIAGNOSIS — D649 Anemia, unspecified: Secondary | ICD-10-CM | POA: Diagnosis not present

## 2021-03-23 DIAGNOSIS — G893 Neoplasm related pain (acute) (chronic): Secondary | ICD-10-CM | POA: Diagnosis present

## 2021-03-23 DIAGNOSIS — F32A Depression, unspecified: Secondary | ICD-10-CM | POA: Diagnosis present

## 2021-03-23 DIAGNOSIS — Z85828 Personal history of other malignant neoplasm of skin: Secondary | ICD-10-CM | POA: Diagnosis not present

## 2021-03-23 DIAGNOSIS — E43 Unspecified severe protein-calorie malnutrition: Secondary | ICD-10-CM | POA: Diagnosis present

## 2021-03-23 DIAGNOSIS — A419 Sepsis, unspecified organism: Secondary | ICD-10-CM | POA: Diagnosis not present

## 2021-03-23 DIAGNOSIS — I480 Paroxysmal atrial fibrillation: Secondary | ICD-10-CM | POA: Diagnosis not present

## 2021-03-23 DIAGNOSIS — I129 Hypertensive chronic kidney disease with stage 1 through stage 4 chronic kidney disease, or unspecified chronic kidney disease: Secondary | ICD-10-CM | POA: Diagnosis present

## 2021-03-23 DIAGNOSIS — M109 Gout, unspecified: Secondary | ICD-10-CM | POA: Diagnosis present

## 2021-03-23 DIAGNOSIS — J189 Pneumonia, unspecified organism: Secondary | ICD-10-CM | POA: Diagnosis present

## 2021-03-23 DIAGNOSIS — C3491 Malignant neoplasm of unspecified part of right bronchus or lung: Secondary | ICD-10-CM | POA: Diagnosis not present

## 2021-03-23 DIAGNOSIS — Z951 Presence of aortocoronary bypass graft: Secondary | ICD-10-CM | POA: Diagnosis not present

## 2021-03-23 DIAGNOSIS — J309 Allergic rhinitis, unspecified: Secondary | ICD-10-CM | POA: Diagnosis present

## 2021-03-23 DIAGNOSIS — Z87891 Personal history of nicotine dependence: Secondary | ICD-10-CM | POA: Diagnosis not present

## 2021-03-23 DIAGNOSIS — N1831 Chronic kidney disease, stage 3a: Secondary | ICD-10-CM | POA: Diagnosis not present

## 2021-03-23 DIAGNOSIS — I959 Hypotension, unspecified: Secondary | ICD-10-CM | POA: Diagnosis not present

## 2021-03-23 DIAGNOSIS — I2510 Atherosclerotic heart disease of native coronary artery without angina pectoris: Secondary | ICD-10-CM | POA: Diagnosis present

## 2021-03-23 DIAGNOSIS — K219 Gastro-esophageal reflux disease without esophagitis: Secondary | ICD-10-CM | POA: Diagnosis present

## 2021-03-23 DIAGNOSIS — E7800 Pure hypercholesterolemia, unspecified: Secondary | ICD-10-CM | POA: Diagnosis present

## 2021-03-23 DIAGNOSIS — C7931 Secondary malignant neoplasm of brain: Secondary | ICD-10-CM | POA: Diagnosis present

## 2021-03-23 DIAGNOSIS — D630 Anemia in neoplastic disease: Secondary | ICD-10-CM | POA: Diagnosis present

## 2021-03-23 DIAGNOSIS — F419 Anxiety disorder, unspecified: Secondary | ICD-10-CM | POA: Diagnosis present

## 2021-03-23 LAB — CBC
HCT: 23.9 % — ABNORMAL LOW (ref 39.0–52.0)
Hemoglobin: 7.9 g/dL — ABNORMAL LOW (ref 13.0–17.0)
MCH: 29.7 pg (ref 26.0–34.0)
MCHC: 33.1 g/dL (ref 30.0–36.0)
MCV: 89.8 fL (ref 80.0–100.0)
Platelets: 127 10*3/uL — ABNORMAL LOW (ref 150–400)
RBC: 2.66 MIL/uL — ABNORMAL LOW (ref 4.22–5.81)
RDW: 13.7 % (ref 11.5–15.5)
WBC: 9.9 10*3/uL (ref 4.0–10.5)
nRBC: 0.3 % — ABNORMAL HIGH (ref 0.0–0.2)

## 2021-03-23 LAB — URINALYSIS, ROUTINE W REFLEX MICROSCOPIC
Bilirubin Urine: NEGATIVE
Glucose, UA: NEGATIVE mg/dL
Hgb urine dipstick: NEGATIVE
Ketones, ur: 20 mg/dL — AB
Leukocytes,Ua: NEGATIVE
Nitrite: NEGATIVE
Protein, ur: NEGATIVE mg/dL
Specific Gravity, Urine: 1.014 (ref 1.005–1.030)
pH: 6 (ref 5.0–8.0)

## 2021-03-23 LAB — STREP PNEUMONIAE URINARY ANTIGEN: Strep Pneumo Urinary Antigen: NEGATIVE

## 2021-03-23 MED ORDER — ENSURE ENLIVE PO LIQD
237.0000 mL | Freq: Two times a day (BID) | ORAL | Status: DC
  Administered 2021-03-24 (×2): 237 mL via ORAL

## 2021-03-23 MED ORDER — NYSTATIN 100000 UNIT/ML MT SUSP
5.0000 mL | Freq: Four times a day (QID) | OROMUCOSAL | Status: DC
  Administered 2021-03-23 – 2021-03-25 (×9): 500000 [IU] via ORAL
  Filled 2021-03-23 (×10): qty 5

## 2021-03-23 MED ORDER — CHLORHEXIDINE GLUCONATE CLOTH 2 % EX PADS
6.0000 | MEDICATED_PAD | Freq: Every day | CUTANEOUS | Status: DC
  Administered 2021-03-23 – 2021-03-26 (×4): 6 via TOPICAL

## 2021-03-23 MED ORDER — RANOLAZINE ER 500 MG PO TB12
1000.0000 mg | ORAL_TABLET | Freq: Two times a day (BID) | ORAL | Status: DC
  Administered 2021-03-23 – 2021-03-26 (×7): 1000 mg via ORAL
  Filled 2021-03-23 (×7): qty 2

## 2021-03-23 MED ORDER — ASPIRIN EC 81 MG PO TBEC
81.0000 mg | DELAYED_RELEASE_TABLET | Freq: Every day | ORAL | Status: DC
  Administered 2021-03-23 – 2021-03-26 (×4): 81 mg via ORAL
  Filled 2021-03-23 (×4): qty 1

## 2021-03-23 MED ORDER — AMIODARONE HCL 200 MG PO TABS
200.0000 mg | ORAL_TABLET | Freq: Every day | ORAL | Status: DC
  Administered 2021-03-23 – 2021-03-26 (×4): 200 mg via ORAL
  Filled 2021-03-23 (×4): qty 1

## 2021-03-23 MED ORDER — CLOPIDOGREL BISULFATE 75 MG PO TABS
75.0000 mg | ORAL_TABLET | Freq: Every day | ORAL | Status: DC
  Administered 2021-03-23 – 2021-03-26 (×4): 75 mg via ORAL
  Filled 2021-03-23 (×4): qty 1

## 2021-03-23 MED ORDER — MORPHINE SULFATE (PF) 2 MG/ML IV SOLN
2.0000 mg | INTRAVENOUS | Status: DC | PRN
  Administered 2021-03-23 – 2021-03-26 (×16): 2 mg via INTRAVENOUS
  Filled 2021-03-23 (×16): qty 1

## 2021-03-23 MED ORDER — GUAIFENESIN-DM 100-10 MG/5ML PO SYRP
5.0000 mL | ORAL_SOLUTION | ORAL | Status: DC | PRN
  Administered 2021-03-23: 5 mL via ORAL
  Filled 2021-03-23: qty 10

## 2021-03-23 MED ORDER — AZITHROMYCIN 250 MG PO TABS
500.0000 mg | ORAL_TABLET | Freq: Every day | ORAL | Status: DC
  Administered 2021-03-23 – 2021-03-26 (×4): 500 mg via ORAL
  Filled 2021-03-23 (×4): qty 2

## 2021-03-23 MED ORDER — OXYCODONE HCL 5 MG PO TABS
5.0000 mg | ORAL_TABLET | ORAL | Status: DC | PRN
  Administered 2021-03-23 – 2021-03-25 (×8): 5 mg via ORAL
  Filled 2021-03-23 (×8): qty 1

## 2021-03-23 NOTE — Progress Notes (Signed)
   03/23/21 0042  Assess: MEWS Score  Temp (!) 101.9 F (38.8 C)  BP 112/60  Pulse Rate (!) 102  Resp 18  SpO2 98 %  O2 Device Room Air  Assess: MEWS Score  MEWS Temp 2  MEWS Systolic 0  MEWS Pulse 1  MEWS RR 0  MEWS LOC 0  MEWS Score 3  MEWS Score Color Yellow  Assess: SIRS CRITERIA  SIRS Temperature  1  SIRS Pulse 1  SIRS Respirations  0  SIRS WBC 0  SIRS Score Sum  2  Patient in yellow mews due to elevated temp. Charge nurse has been notified. Provider was in room and aware. Tylenol given. Will recheck temperature.

## 2021-03-23 NOTE — Progress Notes (Signed)
[facility] PROGRESS NOTE   [name] ZHG:[id number] DOB: May 26, 1956 DOA: 03/22/2021  PCP: [name], MD  Brief History/Interval Summary: 65 y.o. 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 fib not on anticoagulation, CKD 3, treated hepatitis C who presented with fever cough.  Found to have pneumonia.  Hospitalize for further management.    Consultants: Medical oncology will be notified of patient's hospitalization  Procedures: None  Antibiotics: Anti-infectives (From admission, onward)    Start     Dose/Rate Route Frequency Ordered Stop   03/23/21 0000  ceFEPIme (MAXIPIME) 2 g in sodium chloride 0.9 % 100 mL IVPB        2 g 200 mL/hr over 30 Minutes Intravenous Every 8 hours 03/22/21 2256     03/22/21 1215  ceFEPIme (MAXIPIME) 2 g in sodium chloride 0.9 % 100 mL IVPB        2 g 200 mL/hr over 30 Minutes Intravenous  Once 03/22/21 1202 03/22/21 1318       Subjective/Interval History: Patient mentions that he continues to have a cough with clear expectoration.  Also has pain in the right lower chest and upper abdomen.  Denies any nausea or vomiting.     Assessment/Plan:  Community-acquired pneumonia/sepsis present on admission This is in the setting of lung cancer status post lobectomy and on chemotherapy.  Not noted to be neutropenic.   Does not have any oxygen requirements currently.  However he was febrile up to 101.3F overnight.  Continue cefepime.  Add azithromycin to cover atypicals.   Follow-up on cultures.  Lactic acid level was normal.  Right lower chest pain/upper abdominal pain LFTs were normal as of yesterday.  Symptoms most likely due to pneumonia.  Continue to monitor.  Recheck LFTs tomorrow.  Stage IV neuroendocrine carcinoma of right lung with brain  metastases Followed by Dr. [name].  Last chemotherapy was about 8 to 10 days ago.  He is status post right lobectomy.  He also received radiation treatment for his small brain metastases.  History of coronary artery disease status post CABG Cardiac status seems to be stable.  Normocytic anemia Drop in hemoglobin noted.  No evidence for overt blood loss.  Could be dilutional drop.  Continue to monitor.  Paroxysmal atrial fibrillation Noted to be on amiodarone.  Not on anticoagulation.  Noted to be on aspirin.  Oral thrush Nystatin.   DVT Prophylaxis: Lovenox Code Status: Full code Family Communication: Discussed with the patient Disposition Plan: Hopefully return home when improved  Status is: Observation  The patient will require care spanning > 2 midnights and should be moved to inpatient because: IV treatments appropriate due to intensity of illness or inability to take PO and Inpatient level of care appropriate due to severity of illness  Dispo: The patient is from: Home              Anticipated d/c is to: Home              Patient currently is not medically stable to d/c.   Difficult to place patient No        Medications: Scheduled:  amiodarone  200 mg Oral Daily   aspirin EC  81 mg Oral Daily   benzonatate  100 mg Oral TID   enoxaparin (LOVENOX) injection  40 mg Subcutaneous QHS   nystatin  5 mL Oral QID   Continuous:  ceFEPime (MAXIPIME) IV 2 g (03/23/21 0818)   BMW:[id number], albuterol, guaiFENesin-dextromethorphan, morphine injection, ondansetron (ZOFRAN) IV, oxyCODONE   Objective:  Vital Signs  Vitals:   03/23/21 0336 03/23/21 0445 03/23/21 0628 03/23/21 0829  BP:  (!) 96/52  123/63  Pulse:  90  (!) 106  Resp:  18  15  Temp: 98.9 F (37.2 C) 98.7 F (37.1 C) 98.4 F (36.9 C) 99.5 F (37.5 C)  TempSrc: Oral Oral Oral Oral  SpO2:  100%  99%  Weight:      Height:        Intake/Output Summary (Last 24 hours) at 03/23/2021 1111 Last data  filed at 03/23/2021 1036 Gross per 24 hour  Intake 2120 ml  Output 600 ml  Net 1520 ml   Filed Weights   03/22/21 1140 03/22/21 2300  Weight: 81.6 kg 81.7 kg    General appearance: Awake alert.  In no distress Resp: Normal effort at rest.  Diminished air entry with crackles of the right base.  No wheezing or rhonchi. Cardio: S1-S2 is normal regular.  No S3-S4.  No rubs murmurs or bruit GI: Abdomen is soft.  Mildly tender in the right upper quadrant without any rebound rigidity or guarding.  No masses organomegaly.   Extremities: No edema.  Full range of motion of lower extremities. Neurologic: Alert and oriented x3.  No focal neurological deficits.    Lab Results:  Data Reviewed: I have personally reviewed following labs and imaging studies  CBC: Recent Labs  Lab 03/22/21 1224 03/23/21 0529  WBC 7.3 9.9  NEUTROABS 4.2  --   HGB 9.4* 7.9*  HCT 27.9* 23.9*  MCV 86.9 89.8  PLT 142* 127*    Basic Metabolic Panel: Recent Labs  Lab 03/22/21 1224  NA 133*  K 3.9  CL 98  CO2 25  GLUCOSE 120*  BUN 20  CREATININE 1.21  CALCIUM 9.0    GFR: Estimated Creatinine Clearance: 60.9 mL/min (by C-G formula based on SCr of 1.21 mg/dL).  Liver Function Tests: Recent Labs  Lab 03/22/21 1224  AST 16  ALT 12  ALKPHOS 67  BILITOT 0.3  PROT 7.1  ALBUMIN 3.2*    Recent Labs  Lab 03/22/21 1224  LIPASE 20     Coagulation Profile: Recent Labs  Lab 03/22/21 1224  INR 1.2      Recent Results (from the past 240 hour(s))  Blood Culture (routine x 2)     Status: None (Preliminary result)   Collection Time: 03/22/21 12:24 PM   Specimen: Right Antecubital; Blood  Result Value Ref Range Status   Specimen Description   Final    RIGHT ANTECUBITAL BLOOD Performed at [facility], [address] [place], [place] [address]    Special Requests   Final    BOTTLES DRAWN AEROBIC AND ANAEROBIC Blood Culture results may not be optimal due to an inadequate volume  of blood received in culture bottles Performed at [facility], [address] [place], [place] [address]    Culture   Final    NO GROWTH < 24 HOURS Performed at [facility], [place] [address], [place], [place] [address]    Report Status PENDING  Incomplete  Blood Culture (routine x 2)     Status: None (Preliminary result)   Collection Time: 03/22/21 12:24 PM   Specimen: Left Antecubital; Blood  Result Value Ref Range Status  Specimen Description   Final    LEFT ANTECUBITAL BLOOD Performed at [facility], [address] [place], [place] [address]    Special Requests   Final    BOTTLES DRAWN AEROBIC AND ANAEROBIC Blood Culture results may not be optimal due to an inadequate volume of blood received in culture bottles Performed at [facility], [address] [place], [place] [address]    Culture   Final    NO GROWTH < 24 HOURS Performed at [facility], [place] [address] [place], [place] [address]    Report Status PENDING  Incomplete  Resp Panel by RT-PCR (Flu A&B, Covid) Nasopharyngeal Swab     Status: None   Collection Time: 03/22/21  2:13 PM   Specimen: Nasopharyngeal Swab; Nasopharyngeal(NP) swabs in vial transport medium  Result Value Ref Range Status   SARS Coronavirus 2 by RT PCR NEGATIVE NEGATIVE Final    Comment: (NOTE) SARS-CoV-2 target nucleic acids are NOT DETECTED.  The SARS-CoV-2 RNA is generally detectable in upper respiratory specimens during the acute phase of infection. The lowest concentration of SARS-CoV-2 viral copies this assay can detect is 138 copies/mL. A negative result does not preclude SARS-Cov-2 infection and should not be used as the sole basis for treatment or other patient management decisions. A negative result may occur with  improper specimen collection/handling, submission of specimen other than nasopharyngeal swab, presence of viral mutation(s) within the areas targeted by this assay, and  inadequate number of viral copies(<138 copies/mL). A negative result must be combined with clinical observations, patient history, and epidemiological information. The expected result is Negative.  Fact Sheet for Patients:  EntrepreneurPulse.com.au  Fact Sheet for Healthcare Providers:  IncredibleEmployment.be  This test is no t yet approved or cleared by the [place] FDA and  has been authorized for detection and/or diagnosis of SARS-CoV-2 by FDA under an Emergency Use Authorization (EUA). This EUA will remain  in effect (meaning this test can be used) for the duration of the COVID-19 declaration under Section 564(b)(1) of the Act, 21 U.S.C.section 360bbb-3(b)(1), unless the authorization is terminated  or revoked sooner.       Influenza A by PCR NEGATIVE NEGATIVE Final   Influenza B by PCR NEGATIVE NEGATIVE Final    Comment: (NOTE) The Xpert Xpress SARS-CoV-2/FLU/RSV plus assay is intended as an aid in the diagnosis of influenza from Nasopharyngeal swab specimens and should not be used as a sole basis for treatment. Nasal washings and aspirates are unacceptable for Xpert Xpress SARS-CoV-2/FLU/RSV testing.  Fact Sheet for Patients: EntrepreneurPulse.com.au  Fact Sheet for Healthcare Providers: IncredibleEmployment.be  This test is not yet approved or cleared by the [place] FDA and has been authorized for detection and/or diagnosis of SARS-CoV-2 by FDA under an Emergency Use Authorization (EUA). This EUA will remain in effect (meaning this test can be used) for the duration of the COVID-19 declaration under Section 564(b)(1) of the Act, 21 U.S.C. section 360bbb-3(b)(1), unless the authorization is terminated or revoked.  Performed at [facility], [address], [place], [place] [address]       Radiology Studies: DG Chest [place] 1 View  Result Date: 03/22/2021 CLINICAL DATA:   Fever and chills starting last night. Ongoing chemotherapy for lung cancer. EXAM: PORTABLE CHEST 1 VIEW COMPARISON:  02/28/2021 FINDINGS: Fall injectable right Port-A-Cath tip: Right atrium. Prior CABG. Prior right upper lobectomy with expected volume loss in the right hemithorax. Lower cervical plate  and screw fixator. Borderline enlargement of the cardiopericardial silhouette, without edema. There is some mild blunting of the right lateral costophrenic angle along with some progressive density at the right lung base compared to 02/28/2021; some of this may be from the patient's known pleural effusion but a component of early pneumonia is difficult to exclude given the patient's symptoms. IMPRESSION: 1. Increased density at the right lung base. A component of this may be due to the patient's small right pleural effusion, but early pneumonia at the right lung base is difficult to exclude given the change from 02/28/2021. 2. Prior right upper lobectomy. 3. Borderline enlargement of the cardiopericardial silhouette, without edema. 4. Electronically Signed   By: Van Clines M.D.   On: 03/22/2021 12:17       LOS: 0 days   [facility] Pager on [email or web address]  03/23/2021, 11:11 AM

## 2021-03-23 NOTE — Progress Notes (Signed)
   03/22/21 2311  Escalate  MEWS: Escalate Yellow: discuss with charge nurse/RN and consider discussing with provider and RRT  Notify: Charge Nurse/RN  Name of Charge Nurse/RN Notified Rocco Serene, RN  Date Charge Nurse/RN Notified 03/22/21  Time Charge Nurse/RN Notified 2311  Notify: Provider  Date Provider Notified 03/22/21 (provider was in room when vitals taken)  Time Provider Notified 2311  Notification Type Face-to-face  Notification Reason Change in status  Provider response See new orders  Date of Provider Response 03/22/21  Time of Provider Response 2312  Document  Patient Outcome Other (Comment)  Progress note created (see row info) Yes

## 2021-03-24 DIAGNOSIS — I480 Paroxysmal atrial fibrillation: Secondary | ICD-10-CM | POA: Diagnosis not present

## 2021-03-24 DIAGNOSIS — D649 Anemia, unspecified: Secondary | ICD-10-CM | POA: Diagnosis not present

## 2021-03-24 DIAGNOSIS — J189 Pneumonia, unspecified organism: Secondary | ICD-10-CM | POA: Diagnosis not present

## 2021-03-24 DIAGNOSIS — C3491 Malignant neoplasm of unspecified part of right bronchus or lung: Secondary | ICD-10-CM | POA: Diagnosis not present

## 2021-03-24 LAB — LEGIONELLA PNEUMOPHILA SEROGP 1 UR AG: L. pneumophila Serogp 1 Ur Ag: NEGATIVE

## 2021-03-24 LAB — COMPREHENSIVE METABOLIC PANEL
ALT: 26 U/L (ref 0–44)
AST: 26 U/L (ref 15–41)
Albumin: 2.6 g/dL — ABNORMAL LOW (ref 3.5–5.0)
Alkaline Phosphatase: 71 U/L (ref 38–126)
Anion gap: 7 (ref 5–15)
BUN: 15 mg/dL (ref 8–23)
CO2: 24 mmol/L (ref 22–32)
Calcium: 8.6 mg/dL — ABNORMAL LOW (ref 8.9–10.3)
Chloride: 105 mmol/L (ref 98–111)
Creatinine, Ser: 1.12 mg/dL (ref 0.61–1.24)
GFR, Estimated: >60 mL/min (ref >60)
Glucose, Bld: 108 mg/dL — ABNORMAL HIGH (ref 70–99)
Potassium: 3.8 mmol/L (ref 3.5–5.1)
Sodium: 136 mmol/L (ref 135–145)
Total Bilirubin: 0.4 mg/dL (ref 0.3–1.2)
Total Protein: 5.8 g/dL — ABNORMAL LOW (ref 6.5–8.1)

## 2021-03-24 LAB — CBC WITH DIFFERENTIAL/PLATELET
Abs Immature Granulocytes: 1.58 10*3/uL — ABNORMAL HIGH (ref 0.00–0.07)
Basophils Absolute: 0.1 10*3/uL (ref 0.0–0.1)
Basophils Relative: 0 %
Eosinophils Absolute: 0.2 10*3/uL (ref 0.0–0.5)
Eosinophils Relative: 1 %
HCT: 23.5 % — ABNORMAL LOW (ref 39.0–52.0)
Hemoglobin: 7.9 g/dL — ABNORMAL LOW (ref 13.0–17.0)
Immature Granulocytes: 9 %
Lymphocytes Relative: 12 %
Lymphs Abs: 2 10*3/uL (ref 0.7–4.0)
MCH: 30 pg (ref 26.0–34.0)
MCHC: 33.6 g/dL (ref 30.0–36.0)
MCV: 89.4 fL (ref 80.0–100.0)
Monocytes Absolute: 1.6 10*3/uL — ABNORMAL HIGH (ref 0.1–1.0)
Monocytes Relative: 9 %
Neutro Abs: 12.1 10*3/uL — ABNORMAL HIGH (ref 1.7–7.7)
Neutrophils Relative %: 69 %
Platelets: 141 10*3/uL — ABNORMAL LOW (ref 150–400)
RBC: 2.63 MIL/uL — ABNORMAL LOW (ref 4.22–5.81)
RDW: 14 % (ref 11.5–15.5)
WBC: 17.6 10*3/uL — ABNORMAL HIGH (ref 4.0–10.5)
nRBC: 0.2 % (ref 0.0–0.2)

## 2021-03-24 LAB — URINE CULTURE: Culture: NO GROWTH

## 2021-03-24 LAB — MAGNESIUM: Magnesium: 1.8 mg/dL (ref 1.7–2.4)

## 2021-03-24 MED ORDER — ADULT MULTIVITAMIN W/MINERALS CH
1.0000 | ORAL_TABLET | Freq: Every day | ORAL | Status: DC
  Administered 2021-03-24 – 2021-03-26 (×3): 1 via ORAL
  Filled 2021-03-24 (×3): qty 1

## 2021-03-24 MED ORDER — ENSURE ENLIVE PO LIQD
237.0000 mL | Freq: Three times a day (TID) | ORAL | Status: DC
  Administered 2021-03-24 – 2021-03-26 (×5): 237 mL via ORAL

## 2021-03-24 MED ORDER — METOPROLOL SUCCINATE ER 25 MG PO TB24
25.0000 mg | ORAL_TABLET | Freq: Every day | ORAL | Status: DC
  Administered 2021-03-25 – 2021-03-26 (×2): 25 mg via ORAL
  Filled 2021-03-24 (×2): qty 1

## 2021-03-24 MED ORDER — ATORVASTATIN CALCIUM 40 MG PO TABS
80.0000 mg | ORAL_TABLET | Freq: Every evening | ORAL | Status: DC
  Administered 2021-03-24 – 2021-03-25 (×2): 80 mg via ORAL
  Filled 2021-03-24 (×2): qty 2

## 2021-03-24 MED ORDER — PROSOURCE PLUS PO LIQD
30.0000 mL | Freq: Two times a day (BID) | ORAL | Status: DC
  Administered 2021-03-24 – 2021-03-26 (×3): 30 mL via ORAL
  Filled 2021-03-24 (×3): qty 30

## 2021-03-24 NOTE — Progress Notes (Signed)
Initial Nutrition Assessment  DOCUMENTATION CODES:  Severe malnutrition in context of chronic illness  INTERVENTION:  Increase Ensure Enlive from BID to TID.  Add Magic cup TID with meals, each supplement provides 290 kcal and 9 grams of protein.  Add 30 ml ProSource Plus po BID, each supplement provides 100 kcal and 15 grams of protein.  Add MVI with minerals daily.    NUTRITION DIAGNOSIS:  Severe Malnutrition related to chronic illness, cancer and cancer related treatments, catabolic illness as evidenced by percent weight loss, moderate muscle depletion, mild fat depletion.  GOAL:  Patient will meet greater than or equal to 90% of their needs  MONITOR:  PO intake, Supplement acceptance, Labs, Weight trends, I & O's  REASON FOR ASSESSMENT:  Malnutrition Screening Tool    ASSESSMENT:  65 y.o. male with medical history significant for stage 4 neuroendocrine carcinoma of the right lung with brain metastases s/p right upper lobectomy currently on chemotherapy and radiation therapy, CAD s/p CABG, paroxysmal A. fib, CKD 3, treated hepatitis C who presented with fever cough. Found to have pneumonia.  Pt ate 100% of breakfast and lunch yesterday. Pt continues to have good PO intake. Pt reports poor appetite, but he makes himself eat and drink Ensure at home so he does not "wither away." He reports foods taste off to him from the chemotherapy, and sometimes they taste fine. "It depends on the day."  Pt has since gained 4 kg since last admission, but lost it again and is down to 81.7 kg. This is a ~9 lb (5%) wt loss in 3 weeks, which is significant and severe for the time frame. Pt continues to lose weight.  On exam, pt has lost significant muscle mass.  Recommend adding Ensure Enlive TID and Magic Cup TID, as well as ProSource Plus BID to promote intake and repletion.  Medications: reviewed; azithromycin, EE BID, cefepime in NaCl TID via IV, morphine PRN (given twice today), Zofran PRN  (given once today)  Labs: reviewed; Glucose 108 (H)  NUTRITION - FOCUSED PHYSICAL EXAM: Flowsheet Row Most Recent Value  Orbital Region Mild depletion  Upper Arm Region Moderate depletion  Thoracic and Lumbar Region No depletion  Buccal Region Mild depletion  Temple Region Moderate depletion  Clavicle Bone Region Mild depletion  Clavicle and Acromion Bone Region Mild depletion  Scapular Bone Region Mild depletion  Dorsal Hand Moderate depletion  Patellar Region Moderate depletion  Anterior Thigh Region Mild depletion  Posterior Calf Region Mild depletion  Edema (RD Assessment) None  Hair Reviewed  Eyes Reviewed  Mouth Reviewed  Skin Reviewed  Nails Reviewed   Diet Order:   Diet Order             Diet Heart Room service appropriate? Yes; Fluid consistency: Thin  Diet effective now                  EDUCATION NEEDS:  Education needs have been addressed  Skin:  Skin Assessment: Reviewed RN Assessment  Last BM:  03/24/21  Height:  Ht Readings from Last 1 Encounters:  03/22/21 5\' 9"  (1.753 m)   Weight:  Wt Readings from Last 1 Encounters:  03/22/21 81.7 kg   BMI:  Body mass index is 26.6 kg/m.  Estimated Nutritional Needs:  Kcal:  2600-2800 Protein:  120-135 grams Fluid:  >2.5 L  Derrel Nip, RD, LDN (she/her/hers) Registered Dietitian I After-Hours/Weekend Pager # in [place]

## 2021-03-24 NOTE — Progress Notes (Addendum)
[facility] PROGRESS NOTE   [name] JJO:[id number] DOB: 01-30-1956 DOA: 03/22/2021  PCP: [name], MD  Brief History/Interval Summary: 65 y.o. 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 fib not on anticoagulation, CKD 3, treated hepatitis C who presented with fever cough.  Found to have pneumonia.  Hospitalize for further management.    Consultants: Medical oncology notified of patient's hospitalization  Procedures: None  Antibiotics: Anti-infectives (From admission, onward)    Start     Dose/Rate Route Frequency Ordered Stop   03/23/21 1215  azithromycin (ZITHROMAX) tablet 500 mg        500 mg Oral Daily 03/23/21 1120 03/28/21 0959   03/23/21 0000  ceFEPIme (MAXIPIME) 2 g in sodium chloride 0.9 % 100 mL IVPB        2 g 200 mL/hr over 30 Minutes Intravenous Every 8 hours 03/22/21 2256     03/22/21 1215  ceFEPIme (MAXIPIME) 2 g in sodium chloride 0.9 % 100 mL IVPB        2 g 200 mL/hr over 30 Minutes Intravenous  Once 03/22/21 1202 03/22/21 1318       Subjective/Interval History: Patient mentions that he is feeling slightly better today.  Has noticed less cough and less discomfort in the right lower chest.  No nausea or vomiting.     Assessment/Plan:  Community-acquired pneumonia/sepsis present on admission This is in the setting of lung cancer status post lobectomy and on chemotherapy.  Not noted to be neutropenic.   Does not have any oxygen requirements currently.  He did have a fever of 101.9 F on 7/25.  Had low-grade fever last night, 100.6.   Remains on cefepime and azithromycin.  Follow-up on cultures.  Lactic acid level  was normal. Symptomatically patient does feel a little better this morning.  Start mobilizing.  Significant increase in WBC most likely due to  pegfilgrastim given on 7/18.  Right lower chest pain/upper abdominal pain This is most likely due to pneumonia.  LFTs are normal.  Patient feels better this morning.  Continue to monitor.    Stage IV neuroendocrine carcinoma of right lung with brain metastases Followed by Dr. [name].  Last chemotherapy was on July 15 or so.   He is status post right lobectomy.  He also received radiation treatment for his small brain metastases.  History of coronary artery disease status post CABG Cardiac status seems to be stable.  Noted to be on aspirin and Plavix.  Metoprolol from tomorrow.  Noted to be on Ranexa.  Resume statin.  Normocytic anemia Drop in hemoglobin from 9.4-7.9 is likely dilutional.  Noted to be stable this morning.  No evidence of overt blood loss.    Paroxysmal atrial fibrillation Noted to be on amiodarone.  Not on anticoagulation.  Noted to be on aspirin.  Oral thrush Nystatin.   DVT Prophylaxis: Lovenox Code Status: Full code Family Communication: Discussed with the patient Disposition Plan: Hopefully return home when improved  Status is: Inpatient  Remains inpatient appropriate because:IV treatments appropriate due to intensity of illness or inability to take PO and Inpatient level of care appropriate due to severity of illness  Dispo:  Patient From: Home  Planned Disposition: Home  Medically stable for discharge: No           Medications: Scheduled:  amiodarone  200 mg  Oral Daily   aspirin EC  81 mg Oral Daily   azithromycin  500 mg Oral Daily   benzonatate  100 mg Oral TID   Chlorhexidine Gluconate Cloth  6 each Topical Daily   clopidogrel  75 mg Oral Daily   enoxaparin (LOVENOX) injection  40 mg Subcutaneous QHS   feeding supplement  237 mL Oral BID BM   nystatin  5 mL Oral QID   ranolazine  1,000 mg Oral BID   Continuous:  ceFEPime (MAXIPIME) IV 2 g (03/24/21 0806)   OVZ:CHYIFOYDXAJOI, albuterol, guaiFENesin-dextromethorphan, morphine  injection, ondansetron (ZOFRAN) IV, oxyCODONE   Objective:  Vital Signs  Vitals:   03/23/21 1408 03/23/21 1435 03/23/21 2148 03/24/21 0610  BP:  111/63 119/64 111/60  Pulse:  (!) 110 94 96  Resp:  16 17 17   Temp: 100 F (37.8 C) 99.8 F (37.7 C) (!) 100.6 F (38.1 C) 98.9 F (37.2 C)  TempSrc:  Oral Oral Oral  SpO2:  98% 97% 96%  Weight:      Height:        Intake/Output Summary (Last 24 hours) at 03/24/2021 1123 Last data filed at 03/24/2021 0948 Gross per 24 hour  Intake 732.34 ml  Output 200 ml  Net 532.34 ml    Filed Weights   03/22/21 1140 03/22/21 2300  Weight: 81.6 kg 81.7 kg    General appearance: Awake alert.  In no distress Resp: Improved air entry bilaterally.  Normal effort at rest.  Crackles right base.  No wheezing or rhonchi.   Cardio: S1-S2 is normal regular.  No S3-S4.  No rubs murmurs or bruit GI: Abdomen is soft.  Nontender nondistended.  Bowel sounds are present normal.  No masses organomegaly Extremities: No edema.  Full range of motion of lower extremities. Neurologic: Alert and oriented x3.  No focal neurological deficits.     Lab Results:  Data Reviewed: I have personally reviewed following labs and imaging studies  CBC: Recent Labs  Lab 03/22/21 1224 03/23/21 0529 03/24/21 0553  WBC 7.3 9.9 17.6*  NEUTROABS 4.2  --  12.1*  HGB 9.4* 7.9* 7.9*  HCT 27.9* 23.9* 23.5*  MCV 86.9 89.8 89.4  PLT 142* 127* 141*     Basic Metabolic Panel: Recent Labs  Lab 03/22/21 1224 03/24/21 0553  NA 133* 136  K 3.9 3.8  CL 98 105  CO2 25 24  GLUCOSE 120* 108*  BUN 20 15  CREATININE 1.21 1.12  CALCIUM 9.0 8.6*  MG  --  1.8     GFR: Estimated Creatinine Clearance: 65.8 mL/min (by C-G formula based on SCr of 1.12 mg/dL).  Liver Function Tests: Recent Labs  Lab 03/22/21 1224 03/24/21 0553  AST 16 26  ALT 12 26  ALKPHOS 67 71  BILITOT 0.3 0.4  PROT 7.1 5.8*  ALBUMIN 3.2* 2.6*     Recent Labs  Lab 03/22/21 1224  LIPASE 20       Coagulation Profile: Recent Labs  Lab 03/22/21 1224  INR 1.2       Recent Results (from the past 240 hour(s))  Blood Culture (routine x 2)     Status: None (Preliminary result)   Collection Time: 03/22/21 12:24 PM   Specimen: Right Antecubital; Blood  Result Value Ref Range Status   Specimen Description   Final    RIGHT ANTECUBITAL BLOOD Performed at [facility], [address], [place], [place] [address]    Special Requests   Final  BOTTLES DRAWN AEROBIC AND ANAEROBIC Blood Culture results may not be optimal due to an inadequate volume of blood received in culture bottles Performed at [facility], [address] [place], [place] [address]    Culture   Final    NO GROWTH 2 DAYS Performed at [facility], [place] [address] [place], [place] [address]    Report Status PENDING  Incomplete  Blood Culture (routine x 2)     Status: None (Preliminary result)   Collection Time: 03/22/21 12:24 PM   Specimen: Left Antecubital; Blood  Result Value Ref Range Status   Specimen Description   Final    LEFT ANTECUBITAL BLOOD Performed at [facility], [address] [place], [place] [address]    Special Requests   Final    BOTTLES DRAWN AEROBIC AND ANAEROBIC Blood Culture results may not be optimal due to an inadequate volume of blood received in culture bottles Performed at [facility], [address] [place], [place] [address]    Culture   Final    NO GROWTH 2 DAYS Performed at [facility], [place] [address] [place], [place] [address]    Report Status PENDING  Incomplete  Resp Panel by RT-PCR (Flu A&B, Covid) Nasopharyngeal Swab     Status: None   Collection Time: 03/22/21  2:13 PM   Specimen: Nasopharyngeal Swab; Nasopharyngeal(NP) swabs in vial transport medium  Result Value Ref Range Status   SARS Coronavirus 2 by RT PCR NEGATIVE NEGATIVE Final    Comment: (NOTE) SARS-CoV-2 target nucleic acids are NOT  DETECTED.  The SARS-CoV-2 RNA is generally detectable in upper respiratory specimens during the acute phase of infection. The lowest concentration of SARS-CoV-2 viral copies this assay can detect is 138 copies/mL. A negative result does not preclude SARS-Cov-2 infection and should not be used as the sole basis for treatment or other patient management decisions. A negative result may occur with  improper specimen collection/handling, submission of specimen other than nasopharyngeal swab, presence of viral mutation(s) within the areas targeted by this assay, and inadequate number of viral copies(<138 copies/mL). A negative result must be combined with clinical observations, patient history, and epidemiological information. The expected result is Negative.  Fact Sheet for Patients:  EntrepreneurPulse.com.au  Fact Sheet for Healthcare Providers:  IncredibleEmployment.be  This test is no t yet approved or cleared by the [place] FDA and  has been authorized for detection and/or diagnosis of SARS-CoV-2 by FDA under an Emergency Use Authorization (EUA). This EUA will remain  in effect (meaning this test can be used) for the duration of the COVID-19 declaration under Section 564(b)(1) of the Act, 21 U.S.C.section 360bbb-3(b)(1), unless the authorization is terminated  or revoked sooner.       Influenza A by PCR NEGATIVE NEGATIVE Final   Influenza B by PCR NEGATIVE NEGATIVE Final    Comment: (NOTE) The Xpert Xpress SARS-CoV-2/FLU/RSV plus assay is intended as an aid in the diagnosis of influenza from Nasopharyngeal swab specimens and should not be used as a sole basis for treatment. Nasal washings and aspirates are unacceptable for Xpert Xpress SARS-CoV-2/FLU/RSV testing.  Fact Sheet for Patients: EntrepreneurPulse.com.au  Fact Sheet for Healthcare Providers: IncredibleEmployment.be  This test is not yet  approved or cleared by the [place] FDA and has been authorized for detection and/or diagnosis of SARS-CoV-2 by FDA under an Emergency Use Authorization (EUA). This EUA will remain in effect (meaning this test  can be used) for the duration of the COVID-19 declaration under Section 564(b)(1) of the Act, 21 U.S.C. section 360bbb-3(b)(1), unless the authorization is terminated or revoked.  Performed at [facility], [address] [place], [place] [address]   Urine Culture     Status: None   Collection Time: 03/22/21  6:36 PM   Specimen: Urine, Random  Result Value Ref Range Status   Specimen Description   Final    URINE, RANDOM Performed at [facility], [address] [place], [place] [address]    Special Requests   Final    NONE Performed at [facility], [address] [place], [place] [address]    Culture   Final    NO GROWTH Performed at [facility], [place] [address], [place], [place] [address]    Report Status 03/24/2021 FINAL  Final       Radiology Studies: DG Chest Port 1 View  Result Date: 03/22/2021 CLINICAL DATA:  Fever and chills starting last night. Ongoing chemotherapy for lung cancer. EXAM: PORTABLE CHEST 1 VIEW COMPARISON:  02/28/2021 FINDINGS: Fall injectable right Port-A-Cath tip: Right atrium. Prior CABG. Prior right upper lobectomy with expected volume loss in the right hemithorax. Lower cervical plate and screw fixator. Borderline enlargement of the cardiopericardial silhouette, without edema. There is some mild blunting of the right lateral costophrenic angle along with some progressive density at the right lung base compared to 02/28/2021; some of this may be from the patient's known pleural effusion but a component of early pneumonia is difficult to exclude given the patient's symptoms. IMPRESSION: 1. Increased density at the right lung base. A component of this may be due to the patient's small right pleural  effusion, but early pneumonia at the right lung base is difficult to exclude given the change from 02/28/2021. 2. Prior right upper lobectomy. 3. Borderline enlargement of the cardiopericardial silhouette, without edema. 4. Electronically Signed   By: Van Clines M.D.   On: 03/22/2021 12:17       LOS: 1 day   [facility] Pager on [email or web address]  03/24/2021, 11:23 AM

## 2021-03-25 ENCOUNTER — Telehealth: Payer: Self-pay

## 2021-03-25 DIAGNOSIS — J189 Pneumonia, unspecified organism: Secondary | ICD-10-CM | POA: Diagnosis not present

## 2021-03-25 DIAGNOSIS — I959 Hypotension, unspecified: Secondary | ICD-10-CM | POA: Diagnosis not present

## 2021-03-25 DIAGNOSIS — I480 Paroxysmal atrial fibrillation: Secondary | ICD-10-CM | POA: Diagnosis not present

## 2021-03-25 DIAGNOSIS — E43 Unspecified severe protein-calorie malnutrition: Secondary | ICD-10-CM

## 2021-03-25 DIAGNOSIS — N1831 Chronic kidney disease, stage 3a: Secondary | ICD-10-CM | POA: Diagnosis not present

## 2021-03-25 DIAGNOSIS — E440 Moderate protein-calorie malnutrition: Secondary | ICD-10-CM | POA: Diagnosis present

## 2021-03-25 LAB — CBC WITH DIFFERENTIAL/PLATELET
Abs Immature Granulocytes: 1.59 10*3/uL — ABNORMAL HIGH (ref 0.00–0.07)
Basophils Absolute: 0.1 10*3/uL (ref 0.0–0.1)
Basophils Relative: 0 %
Eosinophils Absolute: 0.2 10*3/uL (ref 0.0–0.5)
Eosinophils Relative: 1 %
HCT: 23.9 % — ABNORMAL LOW (ref 39.0–52.0)
Hemoglobin: 7.7 g/dL — ABNORMAL LOW (ref 13.0–17.0)
Immature Granulocytes: 9 %
Lymphocytes Relative: 14 %
Lymphs Abs: 2.4 10*3/uL (ref 0.7–4.0)
MCH: 29.3 pg (ref 26.0–34.0)
MCHC: 32.2 g/dL (ref 30.0–36.0)
MCV: 90.9 fL (ref 80.0–100.0)
Monocytes Absolute: 1.4 10*3/uL — ABNORMAL HIGH (ref 0.1–1.0)
Monocytes Relative: 8 %
Neutro Abs: 12.1 10*3/uL — ABNORMAL HIGH (ref 1.7–7.7)
Neutrophils Relative %: 68 %
Platelets: 177 10*3/uL (ref 150–400)
RBC: 2.63 MIL/uL — ABNORMAL LOW (ref 4.22–5.81)
RDW: 14.1 % (ref 11.5–15.5)
WBC: 17.6 10*3/uL — ABNORMAL HIGH (ref 4.0–10.5)
nRBC: 0.1 % (ref 0.0–0.2)

## 2021-03-25 LAB — COMPREHENSIVE METABOLIC PANEL
ALT: 60 U/L — ABNORMAL HIGH (ref 0–44)
AST: 54 U/L — ABNORMAL HIGH (ref 15–41)
Albumin: 2.4 g/dL — ABNORMAL LOW (ref 3.5–5.0)
Alkaline Phosphatase: 76 U/L (ref 38–126)
Anion gap: 9 (ref 5–15)
BUN: 15 mg/dL (ref 8–23)
CO2: 27 mmol/L (ref 22–32)
Calcium: 8.8 mg/dL — ABNORMAL LOW (ref 8.9–10.3)
Chloride: 102 mmol/L (ref 98–111)
Creatinine, Ser: 1.02 mg/dL (ref 0.61–1.24)
GFR, Estimated: >60 mL/min (ref >60)
Glucose, Bld: 104 mg/dL — ABNORMAL HIGH (ref 70–99)
Potassium: 3.6 mmol/L (ref 3.5–5.1)
Sodium: 138 mmol/L (ref 135–145)
Total Bilirubin: 0.3 mg/dL (ref 0.3–1.2)
Total Protein: 5.9 g/dL — ABNORMAL LOW (ref 6.5–8.1)

## 2021-03-25 MED ORDER — HYDROCODONE BIT-HOMATROP MBR 5-1.5 MG/5ML PO SOLN
5.0000 mL | Freq: Four times a day (QID) | ORAL | Status: DC | PRN
  Administered 2021-03-25 – 2021-03-26 (×4): 5 mL via ORAL
  Filled 2021-03-25 (×4): qty 5

## 2021-03-25 NOTE — Progress Notes (Signed)
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   Assessment & Plan:   Principal Problem:   Community acquired pneumonia Active Problems:   S/P CABG x 5   S/P lobectomy of lung   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HCC)   AF (paroxysmal atrial fibrillation) (HCC)   PNA (pneumonia)   Sepsis (HCC)   Hypotension   CKD (chronic kidney disease), stage III (HCC)   CAP (community acquired pneumonia)   Protein-calorie malnutrition, severe   Community-acquired pneumonia/sepsis present on admission Patient presenting to the ED with shortness of breath, cough and fever 101.9 F.  Complicated by setting of lung cancer s/p lobectomy on current chemotherapy.  Was not neutropenic.  Lactic acid within normal limits.  Chest x-ray with increased density right lung base consistent with pneumonia. --Azithromycin 500 mg p.o. daily x5 days --Cefepime --Blood cultures x2: No growth x2 days --Continue monitor fever curve, T-max past 24 hours 100.8 F --Tylenol as needed, Hycodan prn  Right lower chest pain/upper abdominal pain This is most likely due to pneumonia.  LFTs are normal.  --Oxycodone 5 mg p.o. q4h prn moderate pain -- Hycodan cough syrup every 6 hours as needed cough --Morphine 2 mg IV every 3 hours as needed severe pain   Stage IV neuroendocrine carcinoma of right lung with brain metastases Followed by Dr. [name].  Last chemotherapy was on July 15. He is status post right lobectomy.   He also received radiation treatment for his small brain metastases.  Outpatient follow-up with medical oncology, scheduled for Monday.   History of coronary artery disease status post CABG --Metoprolol succinate 25 mg p.o. daily --Aspirin 81 mg p.o. daily --Atorvastatin 80 mg p.o. daily --Plavix 75 mg p.o. daily -- Ranolazine 1000 mg p.o. twice daily  Normocytic anemia Drop in hemoglobin from 9.4-7.9 is likely dilutional.  Noted to be stable this morning.  No evidence of overt blood loss.    Paroxysmal atrial fibrillation Amiodarone 200 mg p.o. daily not on anticoagulation.  Noted to be on aspirin.   Oral thrush Nystatin.  Severe protein calorie malnutrition, POA Nutrition Status: Nutrition Problem: Severe Malnutrition Etiology: chronic illness, cancer and cancer related treatments, catabolic illness Signs/Symptoms: percent weight loss, moderate muscle depletion, mild fat depletion Percent weight loss: 5 % Interventions: Ensure Enlive (each supplement provides 350kcal and 20 grams of protein), Magic cup, Prostat, MVI --Dietitian following, appreciate assistance.  Continue to encourage increased oral intake with supplements    DVT prophylaxis: enoxaparin (LOVENOX) injection 40 mg Start: 03/22/21 2300   Code Status: Full Code Family Communication: No family present at bedside this morning  Disposition Plan:  Level of care: Telemetry Status is: Inpatient  Not inpatient appropriate, will call UM team and downgrade to OBS.   Dispo:  Patient From: Home  Planned Disposition: Home  Medically stable for discharge: No     Consultants:  Medical oncology notified of admission  Procedures:  None  Antimicrobials:  Azithromycin 7/25>> Cefepime 7/24>>    Subjective: Patient seen examined bedside, resting  comfortably.  Continues with mild right-sided pain.  Has been utilizing morphine and oxycodone.  Discussed with patient need to titrate down IV pain medication and will  start using hydrocodone as many of his symptoms related to his cough.  Otherwise patient feeling well.  T-max past 24 hours 100.8.  Discussed with patient if he remains afebrile next 24 hours anticipate discharge home.  No other questions or concerns at this time.  Objective: Vitals:   03/24/21 2053 03/25/21 0522 03/25/21 1307 03/25/21 1620  BP: 119/64 118/63 120/64   Pulse: (!) 101 90 94   Resp: 20 18    Temp: 99.8 F (37.7 C) 98.5 F (36.9 C) 99.4 F (37.4 C) 99.5 F (37.5 C)  TempSrc: Oral Oral Oral Oral  SpO2: 95% 97% 99%   Weight:      Height:        Intake/Output Summary (Last 24 hours) at 03/25/2021 1730 Last data filed at 03/25/2021 1621 Gross per 24 hour  Intake 1037.73 ml  Output --  Net 1037.73 ml   Filed Weights   03/22/21 1140 03/22/21 2300  Weight: 81.6 kg 81.7 kg    Examination:  General exam: Appears calm and comfortable  Respiratory system: Clear to auscultation. Respiratory effort normal.  On room air Cardiovascular system: S1 & S2 heard, RRR. No JVD, murmurs, rubs, gallops or clicks. No pedal edema. Gastrointestinal system: Abdomen is nondistended, soft and nontender. No organomegaly or masses felt. Normal bowel sounds heard. Central nervous system: Alert and oriented. No focal neurological deficits. Extremities: Symmetric 5 x 5 power. Skin: No rashes, lesions or ulcers Psychiatry: Judgement and insight appear normal. Mood & affect appropriate.     Data Reviewed: I have personally reviewed following labs and imaging studies  CBC: Recent Labs  Lab 03/22/21 1224 03/23/21 0529 03/24/21 0553 03/25/21 0500  WBC 7.3 9.9 17.6* 17.6*  NEUTROABS 4.2  --  12.1* 12.1*  HGB 9.4* 7.9* 7.9* 7.7*  HCT 27.9* 23.9* 23.5* 23.9*  MCV 86.9 89.8 89.4 90.9  PLT 142* 127* 141* 175   Basic Metabolic Panel: Recent Labs  Lab 03/22/21 1224 03/24/21 0553 03/25/21 0500  NA 133* 136 138  K 3.9 3.8 3.6  CL 98 105 102  CO2 25 24 27   GLUCOSE 120* 108* 104*  BUN  20 15 15   CREATININE 1.21 1.12 1.02  CALCIUM 9.0 8.6* 8.8*  MG  --  1.8  --    GFR: Estimated Creatinine Clearance: 72.2 mL/min (by C-G formula based on SCr of 1.02 mg/dL). Liver Function Tests: Recent Labs  Lab 03/22/21 1224 03/24/21 0553 03/25/21 0500  AST 16 26 54*  ALT 12 26 60*  ALKPHOS 67 71 76  BILITOT 0.3 0.4 0.3  PROT 7.1 5.8* 5.9*  ALBUMIN 3.2* 2.6* 2.4*   Recent Labs  Lab 03/22/21 1224  LIPASE 20   No results for input(s): AMMONIA in the last 168 hours. Coagulation Profile: Recent Labs  Lab 03/22/21 1224  INR 1.2   Cardiac Enzymes: No results for input(s): CKTOTAL, CKMB, CKMBINDEX, TROPONINI in the last 168 hours. BNP (last 3 results) No results for input(s): PROBNP in the last 8760 hours. HbA1C: No results for input(s): HGBA1C in the last 72 hours. CBG: No results for input(s): GLUCAP in the last 168 hours. Lipid Profile: No results for input(s): CHOL, HDL, LDLCALC, TRIG, CHOLHDL, LDLDIRECT in the last 72 hours. Thyroid Function Tests: No results for input(s): TSH, T4TOTAL, FREET4, T3FREE, THYROIDAB in the last 72 hours. Anemia  Panel: No results for input(s): VITAMINB12, FOLATE, FERRITIN, TIBC, IRON, RETICCTPCT in the last 72 hours. Sepsis Labs: Recent Labs  Lab 03/22/21 1223  LATICACIDVEN 1.4    Recent Results (from the past 240 hour(s))  Blood Culture (routine x 2)     Status: None (Preliminary result)   Collection Time: 03/22/21 12:24 PM   Specimen: Right Antecubital; Blood  Result Value Ref Range Status   Specimen Description   Final    RIGHT ANTECUBITAL BLOOD Performed at [facility], [address] [place], [place] [address]    Special Requests   Final    BOTTLES DRAWN AEROBIC AND ANAEROBIC Blood Culture results may not be optimal due to an inadequate volume of blood received in culture bottles Performed at [facility], [address] [place], [place] [address]    Culture   Final    NO GROWTH 3  DAYS Performed at [facility], [place] [address], [place], [place] [address]    Report Status PENDING  Incomplete  Blood Culture (routine x 2)     Status: None (Preliminary result)   Collection Time: 03/22/21 12:24 PM   Specimen: Left Antecubital; Blood  Result Value Ref Range Status   Specimen Description   Final    LEFT ANTECUBITAL BLOOD Performed at [facility], [address] [place], [place] [address]    Special Requests   Final    BOTTLES DRAWN AEROBIC AND ANAEROBIC Blood Culture results may not be optimal due to an inadequate volume of blood received in culture bottles Performed at [facility], [address] [place], [place] [address]    Culture   Final    NO GROWTH 3 DAYS Performed at [facility], [place] [address], [place], [place] [address]    Report Status PENDING  Incomplete  Resp Panel by RT-PCR (Flu A&B, Covid) Nasopharyngeal Swab     Status: None   Collection Time: 03/22/21  2:13 PM   Specimen: Nasopharyngeal Swab; Nasopharyngeal(NP) swabs in vial transport medium  Result Value Ref Range Status   SARS Coronavirus 2 by RT PCR NEGATIVE NEGATIVE Final    Comment: (NOTE) SARS-CoV-2 target nucleic acids are NOT DETECTED.  The SARS-CoV-2 RNA is generally detectable in upper respiratory specimens during the acute phase of infection. The lowest concentration of SARS-CoV-2 viral copies this assay can detect is 138 copies/mL. A negative result does not preclude SARS-Cov-2 infection and should not be used as the sole basis for treatment or other patient management decisions. A negative result may occur with  improper specimen collection/handling, submission of specimen other than nasopharyngeal swab, presence of viral mutation(s) within the areas targeted by this assay, and inadequate number of viral copies(<138 copies/mL). A negative result must be combined with clinical observations, patient history, and epidemiological information. The  expected result is Negative.  Fact Sheet for Patients:  EntrepreneurPulse.com.au  Fact Sheet for Healthcare Providers:  IncredibleEmployment.be  This test is no t yet approved or cleared by the [place] FDA and  has been authorized for detection and/or diagnosis of SARS-CoV-2 by FDA under an Emergency Use Authorization (EUA). This EUA will remain  in effect (meaning this test can be used) for the duration of the COVID-19 declaration under Section 564(b)(1) of the Act, 21 U.S.C.section 360bbb-3(b)(1), unless the authorization is terminated  or revoked sooner.       Influenza A by PCR NEGATIVE NEGATIVE Final  Influenza B by PCR NEGATIVE NEGATIVE Final    Comment: (NOTE) The Xpert Xpress SARS-CoV-2/FLU/RSV plus assay is intended as an aid in the diagnosis of influenza from Nasopharyngeal swab specimens and should not be used as a sole basis for treatment. Nasal washings and aspirates are unacceptable for Xpert Xpress SARS-CoV-2/FLU/RSV testing.  Fact Sheet for Patients: EntrepreneurPulse.com.au  Fact Sheet for Healthcare Providers: IncredibleEmployment.be  This test is not yet approved or cleared by the [place] FDA and has been authorized for detection and/or diagnosis of SARS-CoV-2 by FDA under an Emergency Use Authorization (EUA). This EUA will remain in effect (meaning this test can be used) for the duration of the COVID-19 declaration under Section 564(b)(1) of the Act, 21 U.S.C. section 360bbb-3(b)(1), unless the authorization is terminated or revoked.  Performed at [facility], [address], [place], [place] [address]   Urine Culture     Status: None   Collection Time: 03/22/21  6:36 PM   Specimen: Urine, Random  Result Value Ref Range Status   Specimen Description   Final    URINE, RANDOM Performed at [facility], [address] [place], [place]  [address]    Special Requests   Final    NONE Performed at [facility], [address] [place], [place] [address]    Culture   Final    NO GROWTH Performed at [facility], [place] [address], [place], [place] [address]    Report Status 03/24/2021 FINAL  Final         Radiology Studies: No results found.      Scheduled Meds:  (feeding supplement) PROSource Plus  30 mL Oral BID BM   amiodarone  200 mg Oral Daily   aspirin EC  81 mg Oral Daily   atorvastatin  80 mg Oral QPM   azithromycin  500 mg Oral Daily   benzonatate  100 mg Oral TID   Chlorhexidine Gluconate Cloth  6 each Topical Daily   clopidogrel  75 mg Oral Daily   enoxaparin (LOVENOX) injection  40 mg Subcutaneous QHS   feeding supplement  237 mL Oral TID BM   metoprolol succinate  25 mg Oral Daily   multivitamin with minerals  1 tablet Oral Daily   nystatin  5 mL Oral QID   ranolazine  1,000 mg Oral BID   Continuous Infusions:  ceFEPime (MAXIPIME) IV 2 g (03/25/21 1621)     LOS: 2 days    Time spent: 39 minutes spent on chart review, discussion with nursing staff, consultants, updating family and interview/physical exam; more than 50% of that time was spent in counseling and/or coordination of care.    Dayshawn Irizarry J [place] DO [facility] Available via Epic secure chat 7am-7pm After these hours, please refer to coverage provider listed on [email or web address] 03/25/2021, 5:30 PM

## 2021-03-25 NOTE — Telephone Encounter (Signed)
Pt called and asks if he will receive treatment Monday when he comes in as he needs to "psych himself up for it". I explained to pt we would have to wait to find out if he will be d/c from inpt, and come in for assessment then Dr [name] will decide. Pt verbalized understanding.

## 2021-03-26 ENCOUNTER — Ambulatory Visit: Payer: Medicare Other | Admitting: Interventional Cardiology

## 2021-03-26 ENCOUNTER — Inpatient Hospital Stay (HOSPITAL_COMMUNITY): Payer: Medicare Other

## 2021-03-26 DIAGNOSIS — I10 Essential (primary) hypertension: Secondary | ICD-10-CM

## 2021-03-26 DIAGNOSIS — I480 Paroxysmal atrial fibrillation: Secondary | ICD-10-CM

## 2021-03-26 DIAGNOSIS — J189 Pneumonia, unspecified organism: Secondary | ICD-10-CM | POA: Diagnosis not present

## 2021-03-26 DIAGNOSIS — I25119 Atherosclerotic heart disease of native coronary artery with unspecified angina pectoris: Secondary | ICD-10-CM

## 2021-03-26 DIAGNOSIS — I959 Hypotension, unspecified: Secondary | ICD-10-CM | POA: Diagnosis not present

## 2021-03-26 DIAGNOSIS — N1831 Chronic kidney disease, stage 3a: Secondary | ICD-10-CM | POA: Diagnosis not present

## 2021-03-26 DIAGNOSIS — E785 Hyperlipidemia, unspecified: Secondary | ICD-10-CM

## 2021-03-26 DIAGNOSIS — I252 Old myocardial infarction: Secondary | ICD-10-CM

## 2021-03-26 LAB — URIC ACID: Uric Acid, Serum: 3.9 mg/dL (ref 3.7–8.6)

## 2021-03-26 MED ORDER — AZITHROMYCIN 500 MG PO TABS: 500.0000 mg | ORAL_TABLET | Freq: Every day | ORAL | 0 refills | Status: AC

## 2021-03-26 MED ORDER — HYDROCODONE BIT-HOMATROP MBR 5-1.5 MG/5ML PO SOLN: 5.0000 mL | Freq: Four times a day (QID) | ORAL | 0 refills | Status: DC | PRN

## 2021-03-26 MED ORDER — HEPARIN SOD (PORK) LOCK FLUSH 100 UNIT/ML IV SOLN
500.0000 [IU] | INTRAVENOUS | Status: DC | PRN
  Filled 2021-03-26: qty 5

## 2021-03-26 MED ORDER — OXYCODONE HCL 5 MG PO TABS: 5.0000 mg | ORAL_TABLET | ORAL | 0 refills | Status: DC | PRN

## 2021-03-26 MED ORDER — CEFDINIR 300 MG PO CAPS: 300.0000 mg | ORAL_CAPSULE | Freq: Two times a day (BID) | ORAL | 0 refills | Status: AC

## 2021-03-26 NOTE — Discharge Summary (Signed)
Physician Discharge Summary  [name] LKT:[id number] DOB: 12-31-1955 DOA: 03/22/2021  PCP: [name], MD  Admit date: 03/22/2021 Discharge date: 03/26/2021  Admitted From: Home Disposition: Home  Recommendations for Outpatient Follow-up:  Follow up with PCP in 1-2 weeks Follow-up with medical oncology, Dr. 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  Hospital course:  Community-acquired pneumonia/sepsis present on admission Patient presenting to the ED with shortness of breath, cough and fever 101.9 F.  Complicated by setting of lung cancer s/p lobectomy on current chemotherapy.  Was not neutropenic.  Lactic acid within normal limits.  Chest x-ray with increased density right lung base consistent with pneumonia.  Was started on azithromycin and cefepime during hospitalization.  Continue azithromycin to complete 5-day course and cefdinir 3 mg p.o. twice daily to complete 10-day course.  Now afebrile past 24 hours with blood cultures no growth during hospitalization.  Hycodan cough syrup as needed.  Right lower chest pain/upper abdominal pain This is most likely due to pneumonia.  LFTs are normal.  --Oxycodone 5 mg p.o. q4h prn moderate pain -- Hycodan cough syrup every 6  hours as needed cough --Morphine 2 mg IV every 3 hours as needed severe pain   Stage IV neuroendocrine carcinoma of right lung with brain metastases Pain of malignancy. Followed by Dr. [name].  Last chemotherapy was on July 15. He is status post right lobectomy.  He also received radiation treatment for his small brain metastases.  Oxycodone 5 mg as needed for pain.  Outpatient follow-up with medical oncology, scheduled for Monday.   History of coronary artery disease status post CABG Metoprolol succinate 25 mg p.o. daily, aspirin 81 mg p.o. daily, atorvastatin 80 mg p.o. daily, Plavix 75 mg p.o. daily, Ranolazine 1000 mg p.o. twice daily  Normocytic anemia Drop in hemoglobin from 9.4-7.9 is likely dilutional.  Noted to be stable this morning.  No evidence of overt blood loss.    Paroxysmal atrial fibrillation Amiodarone 200 mg p.o. daily not on anticoagulation.  Noted to be on aspirin.    Severe protein calorie malnutrition, POA Nutrition Status: Nutrition Problem: Severe Malnutrition Etiology: chronic illness, cancer and cancer related treatments, catabolic illness Signs/Symptoms: percent weight loss, moderate muscle depletion, mild fat depletion Percent weight loss: 5 % Interventions: Ensure Enlive (each supplement provides 350kcal and 20 grams of protein), Magic cup, Prostat, MVI Dietitian follow during hospital course.  Continue to encourage increased oral intake with supplements  Discharge Diagnoses:  Principal Problem:   Community acquired pneumonia Active Problems:   S/P CABG x 5   S/P lobectomy of lung   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HCC)   AF (paroxysmal atrial fibrillation) (HCC)   PNA (pneumonia)   CKD (chronic kidney disease), stage III (HCC)   CAP (community acquired pneumonia)   Protein-calorie malnutrition, severe    Discharge Instructions  Discharge Instructions  Call MD for:  difficulty breathing, headache or visual disturbances   Complete by: As  directed    Call MD for:  extreme fatigue   Complete by: As directed    Call MD for:  persistant dizziness or light-headedness   Complete by: As directed    Call MD for:  persistant nausea and vomiting   Complete by: As directed    Call MD for:  severe uncontrolled pain   Complete by: As directed    Call MD for:  temperature >100.4   Complete by: As directed    Diet - low sodium heart healthy   Complete by: As directed    Increase activity slowly   Complete by: As directed       Allergies as of 03/26/2021       Reactions   Prednisone Other (See Comments)   States that this med makes him "crazy"   Tetanus Toxoids Swelling, Other (See Comments)   Fever, Swelling of the arm    Wellbutrin [bupropion] Other (See Comments)   Crazy thoughts, nightmares   Indomethacin Other (See Comments)   rectal bleeding   Other    Other reaction(s): Unknown   Varenicline Other (See Comments)   Dreams Other reaction(s): Unknown Other reaction(s): Unknown        Medication List     STOP taking these medications    oxyCODONE-acetaminophen 5-325 MG tablet Commonly known as: PERCOCET/ROXICET       TAKE these medications    acetaminophen 500 MG tablet Commonly known as: TYLENOL Take 1,000 mg by mouth every 6 (six) hours as needed for moderate pain.   albuterol 108 (90 Base) MCG/ACT inhaler Commonly known as: VENTOLIN HFA Inhale 2 puffs into the lungs every 6 (six) hours as needed for wheezing or shortness of breath.   amiodarone 200 MG tablet Commonly known as: PACERONE Take 1 tablet (200 mg total) by mouth daily.   aspirin EC 81 MG tablet Take 81 mg by mouth daily. Swallow whole.   azithromycin 500 MG tablet Commonly known as: ZITHROMAX Take 1 tablet (500 mg total) by mouth daily for 1 day. Start taking on: March 27, 2021   cefdinir 300 MG capsule Commonly known as: OMNICEF Take 1 capsule (300 mg total) by mouth 2 (two) times daily for 6 days.   cloNIDine 0.1 MG  tablet Commonly known as: CATAPRES Take 0.1 mg by mouth daily as needed (sleep).   FIRST-DUKES MOUTHWASH MT Use as directed 5 mLs in the mouth or throat 4 (four) times daily -  before meals and at bedtime.   HYDROcodone bit-homatropine 5-1.5 MG/5ML syrup Commonly known as: HYCODAN Take 5 mLs by mouth every 6 (six) hours as needed for cough.   lidocaine-prilocaine cream Commonly known as: EMLA Apply 1 application topically once as needed (port access).   magnesium oxide 400 (240 Mg) MG tablet Commonly known as: MAG-OX Take 1 tablet (400 mg total) by mouth daily.   metoprolol succinate 25 MG 24 hr tablet Commonly known as: TOPROL-XL Take 1 tablet (25 mg total) by mouth daily.   nitroGLYCERIN 0.4 MG SL tablet Commonly known as: NITROSTAT PLACE 1 TABLET UNDER THE TONGUE EVERY 5 MINUTES AS NEEDED FOR CHEST PAIN. 3 DOSES MAX   ondansetron 8 MG tablet Commonly known as: ZOFRAN Take 1 tablet (8 mg total) by mouth every 8 (eight) hours as needed for nausea or vomiting. Starting 3 days after chemotherapy   oxyCODONE 5 MG immediate release tablet Commonly known  as: Oxy IR/ROXICODONE Take 1 tablet (5 mg total) by mouth every 4 (four) hours as needed for moderate pain.   prochlorperazine 10 MG tablet Commonly known as: COMPAZINE Take 1 tablet (10 mg total) by mouth every 6 (six) hours as needed.   Stiolto Respimat 2.5-2.5 MCG/ACT Aers Generic drug: Tiotropium Bromide-Olodaterol Inhale 2 puffs into the lungs daily.       ASK your doctor about these medications    amLODipine 10 MG tablet Commonly known as: NORVASC TAKE 1 TABLET(10 MG) BY MOUTH DAILY   atorvastatin 80 MG tablet Commonly known as: LIPITOR TAKE 1 TABLET(80 MG) BY MOUTH DAILY   clopidogrel 75 MG tablet Commonly known as: PLAVIX TAKE 1 TABLET BY MOUTH EVERY DAY   ranolazine 1000 MG SR tablet Commonly known as: RANEXA Take 1 tablet (1,000 mg total) by mouth 2 (two) times daily.        Follow-up  Information     [name], MD. Schedule an appointment as soon as possible for a visit in 1 week(s).   Specialty: Family Medicine Contact information: [address] [place] [address] [phone]         Curt Bears, MD. Go on 03/30/2021.   Specialty: Oncology Contact information: [address] [place] [address] [phone]                Allergies  Allergen Reactions   Prednisone Other (See Comments)    States that this med makes him "crazy"   Tetanus Toxoids Swelling and Other (See Comments)    Fever, Swelling of the arm    Wellbutrin [Bupropion] Other (See Comments)    Crazy thoughts, nightmares   Indomethacin Other (See Comments)    rectal bleeding   Other     Other reaction(s): Unknown   Varenicline Other (See Comments)    Dreams Other reaction(s): Unknown Other reaction(s): Unknown    Consultations: None   Procedures/Studies: DG Chest 1 View  Result Date: 02/25/2021 CLINICAL DATA:  Post thoracentesis EXAM: CHEST  1 VIEW COMPARISON:  02/24/2021 radiograph and chest CT FINDINGS: Post sternotomy changes. Right-sided central venous catheter tip over the proximal right atrium. Trace right-sided pleural effusion. No visible pneumothorax. Stable cardiomediastinal silhouette with aortic atherosclerosis. Surgical hardware at the thoracic inlet region. IMPRESSION: 1. Trace right pleural effusion without visible pneumothorax post thoracentesis. 2. No significant changes. Electronically Signed   By: Donavan Foil M.D.   On: 02/25/2021 15:27   CT Angio Chest PE W and/or Wo Contrast  Result Date: 02/28/2021 CLINICAL DATA:  Neuroendocrine lung carcinoma with brain metastases, currently on chemo Per EMS, Pt, from home, c/o SOB w/ exertion and chronic upper abdominal pain. Pt was discharged yesterday. EXAM: CT ANGIOGRAPHY CHEST WITH CONTRAST TECHNIQUE: Multidetector CT imaging of the chest was performed using the standard protocol  during bolus administration of intravenous contrast. Multiplanar CT image reconstructions and MIPs were obtained to evaluate the vascular anatomy. CONTRAST:  57mL OMNIPAQUE IOHEXOL 350 MG/ML SOLN COMPARISON:  CT angiography chest 02/24/2021, 02/14/2021 FINDINGS: Cardiovascular: Fairly satisfactory opacification of the pulmonary arteries to the segmental level. Limited evaluation of the subsegmental level due to motion artifact. No evidence of pulmonary embolism. The main pulmonary artery is normal in caliber. Normal heart size. No significant pericardial effusion. The thoracic aorta is stable in caliber measuring up to 3.8 cm on axial imaging (5:135). At least mild atherosclerotic plaque of the thoracic aorta. Coronary artery calcifications status post coronary artery bypass graft. Mediastinum/Nodes: No  enlarged mediastinal, hilar, or axillary lymph nodes. Thyroid gland, trachea, and esophagus demonstrate no significant findings. Lungs/Pleura: Status post right upper lobectomy. Persistent mucous plugging noted within the right lower lobe. Interval decrease in size of a trace right pleural effusion. Passive atelectasis of the right lower lobe. No left pleural effusion. Linear atelectasis of the right middle lobe. No focal consolidation. No pulmonary nodule, no pulmonary mass. Upper Abdomen: No acute abnormality. A couple of splenules are again noted and partially visualized (282, 5:318) Musculoskeletal: No chest wall abnormality. No suspicious lytic or blastic osseous lesions. No acute displaced fracture. Multilevel degenerative changes of the spine. Anterior surgical hardware of the C7-T1 level. Review of the MIP images confirms the above findings. IMPRESSION: 1. No pulmonary embolus. 2. Interval decrease in trace right pleural effusion in a patient status post right upper lobectomy. 3. Persistent right lower lobe bronchial debris/mucous plugging. No associated parenchymal or interstitial infection. 4.  Aortic  Atherosclerosis (ICD10-I70.0). Electronically Signed   By: Iven Finn M.D.   On: 02/28/2021 23:12   DG Chest Port 1 View  Result Date: 03/22/2021 CLINICAL DATA:  Fever and chills starting last night. Ongoing chemotherapy for lung cancer. EXAM: PORTABLE CHEST 1 VIEW COMPARISON:  02/28/2021 FINDINGS: Fall injectable right Port-A-Cath tip: Right atrium. Prior CABG. Prior right upper lobectomy with expected volume loss in the right hemithorax. Lower cervical plate and screw fixator. Borderline enlargement of the cardiopericardial silhouette, without edema. There is some mild blunting of the right lateral costophrenic angle along with some progressive density at the right lung base compared to 02/28/2021; some of this may be from the patient's known pleural effusion but a component of early pneumonia is difficult to exclude given the patient's symptoms. IMPRESSION: 1. Increased density at the right lung base. A component of this may be due to the patient's small right pleural effusion, but early pneumonia at the right lung base is difficult to exclude given the change from 02/28/2021. 2. Prior right upper lobectomy. 3. Borderline enlargement of the cardiopericardial silhouette, without edema. 4. Electronically Signed   By: Van Clines M.D.   On: 03/22/2021 12:17   DG Chest Port 1 View  Result Date: 02/28/2021 CLINICAL DATA:  Pt c/o SOB w/ exertion and chronic upper abdominal pain. H/o CAD, Stroke, Myocardial Infarction. Former smoker. dyspnea EXAM: PORTABLE CHEST 1 VIEW COMPARISON:  02/25/2021 FINDINGS: Port in the anterior chest wall with tip in distal SVC. Sternotomy wires overlie normal cardiac silhouette. Normal pulmonary vasculature. Small chronic RIGHT effusion. No infiltrate or pneumothorax. No acute osseous abnormality. Anterior cervical fusion IMPRESSION: 1. Small chronic RIGHT effusion. 2. No pneumonia or edema. Electronically Signed   By: Suzy Bouchard M.D.   On: 02/28/2021 19:19   DG  Foot Complete Left  Result Date: 03/26/2021 CLINICAL DATA:  Left foot pain. EXAM: LEFT FOOT - COMPLETE 3+ VIEW COMPARISON:  08/28/2008. FINDINGS: No acute bony or joint abnormality identified. No evidence of fracture dislocation. No radiopaque foreign body. IMPRESSION: No acute abnormality. Electronically Signed   By: Marcello Moores  Register   On: 03/26/2021 10:45   US THORACENTESIS ASP PLEURAL SPACE W/IMG GUIDE  Result Date: 02/25/2021 INDICATION: Patient with history of stage IV large cell neuroendocrine carcinoma of the right lung with brain mets and prior lobectomy, coronary artery disease with prior CABG in 2017 and DES in 2019, CHF, dyspnea, right pleural effusion. Request received for diagnostic and therapeutic right thoracentesis. EXAM: ULTRASOUND GUIDED DIAGNOSTIC AND THERAPEUTIC RIGHT THORACENTESIS MEDICATIONS: 1% lidocaine to skin and  subcutaneous tissue COMPLICATIONS: None immediate. PROCEDURE: An ultrasound guided thoracentesis was thoroughly discussed with the patient and questions answered. The benefits, risks, alternatives and complications were also discussed. The patient understands and wishes to proceed with the procedure. Written consent was obtained. Ultrasound was performed to localize and mark an adequate pocket of fluid in the right chest. The area was then prepped and draped in the normal sterile fashion. 1% Lidocaine was used for local anesthesia. Under ultrasound guidance a 6 Fr Safe-T-Centesis catheter was introduced. Thoracentesis was performed. The catheter was removed and a dressing applied. FINDINGS: A total of approximately 220 cc of hazy, amber fluid was removed. Samples were sent to the laboratory as requested by the clinical team. IMPRESSION: Successful ultrasound guided diagnostic and therapeutic right thoracentesis yielding 220 cc of pleural fluid. Read by: Rowe Robert, PA-C Electronically Signed   By: Aletta Edouard M.D.   On: 02/25/2021 14:51     Subjective: Patient seen  and examined at bedside, resting comfortably.  Complained of some left foot pain after going to the bathroom yesterday.  Concerned about possible gout versus bony injury.  Uric acid within normal limits, left foot x-ray negative for acute process.  Ready for discharge home.  Has been afebrile past 24 hours.  No other concerns or concerns plaints at this time.  Denies headache, no fever/chills/night sweats, no nausea/vomiting/diarrhea, no chest pain, no palpitations, no shortness of breath, no abdominal pain, no weakness, no fatigue, no paresthesias.  No acute events overnight per nursing.  Discharge Exam: Vitals:   03/26/21 0533 03/26/21 1304  BP: 112/73 117/63  Pulse: 96 92  Resp: 20 16  Temp: 99.8 F (37.7 C) 100 F (37.8 C)  SpO2: 96% 96%   Vitals:   03/25/21 1620 03/25/21 2047 03/26/21 0533 03/26/21 1304  BP:  116/65 112/73 117/63  Pulse:  90 96 92  Resp:  18 20 16   Temp: 99.5 F (37.5 C) 97.7 F (36.5 C) 99.8 F (37.7 C) 100 F (37.8 C)  TempSrc: Oral Oral Oral Oral  SpO2:  98% 96% 96%  Weight:      Height:        General: Pt is alert, awake, not in acute distress Cardiovascular: RRR, S1/S2 +, no rubs, no gallops Respiratory: CTA bilaterally, no wheezing, no rhonchi Abdominal: Soft, NT, ND, bowel sounds + Extremities: no edema, no cyanosis    The results of significant diagnostics from this hospitalization (including imaging, microbiology, ancillary and laboratory) are listed below for reference.     Microbiology: Recent Results (from the past 240 hour(s))  Blood Culture (routine x 2)     Status: None (Preliminary result)   Collection Time: 03/22/21 12:24 PM   Specimen: Right Antecubital; Blood  Result Value Ref Range Status   Specimen Description   Final    RIGHT ANTECUBITAL BLOOD Performed at [facility], [address] [place], [place] [address]    Special Requests   Final    BOTTLES DRAWN AEROBIC AND ANAEROBIC Blood Culture results may not be  optimal due to an inadequate volume of blood received in culture bottles Performed at [facility], [address] [place], [place] [address]    Culture   Final    NO GROWTH 4 DAYS Performed at [facility], [place] [address] [place], [place] [address]    Report Status PENDING  Incomplete  Blood Culture (routine x 2)     Status: None (Preliminary result)  Collection Time: 03/22/21 12:24 PM   Specimen: Left Antecubital; Blood  Result Value Ref Range Status   Specimen Description   Final    LEFT ANTECUBITAL BLOOD Performed at [facility], [address] [place], [place] [address]    Special Requests   Final    BOTTLES DRAWN AEROBIC AND ANAEROBIC Blood Culture results may not be optimal due to an inadequate volume of blood received in culture bottles Performed at [facility], [address] [place], [place] [address]    Culture   Final    NO GROWTH 4 DAYS Performed at [facility], [place] [address], [place], [place] [address]    Report Status PENDING  Incomplete  Resp Panel by RT-PCR (Flu A&B, Covid) Nasopharyngeal Swab     Status: None   Collection Time: 03/22/21  2:13 PM   Specimen: Nasopharyngeal Swab; Nasopharyngeal(NP) swabs in vial transport medium  Result Value Ref Range Status   SARS Coronavirus 2 by RT PCR NEGATIVE NEGATIVE Final    Comment: (NOTE) SARS-CoV-2 target nucleic acids are NOT DETECTED.  The SARS-CoV-2 RNA is generally detectable in upper respiratory specimens during the acute phase of infection. The lowest concentration of SARS-CoV-2 viral copies this assay can detect is 138 copies/mL. A negative result does not preclude SARS-Cov-2 infection and should not be used as the sole basis for treatment or other patient management decisions. A negative result may occur with  improper specimen collection/handling, submission of specimen other than nasopharyngeal swab, presence of viral mutation(s) within the areas  targeted by this assay, and inadequate number of viral copies(<138 copies/mL). A negative result must be combined with clinical observations, patient history, and epidemiological information. The expected result is Negative.  Fact Sheet for Patients:  EntrepreneurPulse.com.au  Fact Sheet for Healthcare Providers:  IncredibleEmployment.be  This test is no t yet approved or cleared by the [place] FDA and  has been authorized for detection and/or diagnosis of SARS-CoV-2 by FDA under an Emergency Use Authorization (EUA). This EUA will remain  in effect (meaning this test can be used) for the duration of the COVID-19 declaration under Section 564(b)(1) of the Act, 21 U.S.C.section 360bbb-3(b)(1), unless the authorization is terminated  or revoked sooner.       Influenza A by PCR NEGATIVE NEGATIVE Final   Influenza B by PCR NEGATIVE NEGATIVE Final    Comment: (NOTE) The Xpert Xpress SARS-CoV-2/FLU/RSV plus assay is intended as an aid in the diagnosis of influenza from Nasopharyngeal swab specimens and should not be used as a sole basis for treatment. Nasal washings and aspirates are unacceptable for Xpert Xpress SARS-CoV-2/FLU/RSV testing.  Fact Sheet for Patients: EntrepreneurPulse.com.au  Fact Sheet for Healthcare Providers: IncredibleEmployment.be  This test is not yet approved or cleared by the [place] FDA and has been authorized for detection and/or diagnosis of SARS-CoV-2 by FDA under an Emergency Use Authorization (EUA). This EUA will remain in effect (meaning this test can be used) for the duration of the COVID-19 declaration under Section 564(b)(1) of the Act, 21 U.S.C. section 360bbb-3(b)(1), unless the authorization is terminated or revoked.  Performed at [facility], [address] [place], [place] [address]   Urine Culture     Status: None   Collection Time: 03/22/21   6:36 PM   Specimen: Urine, Random  Result Value Ref Range Status   Specimen Description   Final    URINE, RANDOM Performed at [facility]  [address], [address], [place], [place] [address]    Special Requests   Final    NONE Performed at [facility], [address], [place], [place] [address]    Culture   Final    NO GROWTH Performed at [facility], [place] [address] [place], [place] [address]    Report Status 03/24/2021 FINAL  Final     Labs: BNP (last 3 results) Recent Labs    02/24/21 0821  BNP 30.1   Basic Metabolic Panel: Recent Labs  Lab 03/22/21 1224 03/24/21 0553 03/25/21 0500  NA 133* 136 138  K 3.9 3.8 3.6  CL 98 105 102  CO2 25 24 27   GLUCOSE 120* 108* 104*  BUN 20 15 15   CREATININE 1.21 1.12 1.02  CALCIUM 9.0 8.6* 8.8*  MG  --  1.8  --    Liver Function Tests: Recent Labs  Lab 03/22/21 1224 03/24/21 0553 03/25/21 0500  AST 16 26 54*  ALT 12 26 60*  ALKPHOS 67 71 76  BILITOT 0.3 0.4 0.3  PROT 7.1 5.8* 5.9*  ALBUMIN 3.2* 2.6* 2.4*   Recent Labs  Lab 03/22/21 1224  LIPASE 20   No results for input(s): AMMONIA in the last 168 hours. CBC: Recent Labs  Lab 03/22/21 1224 03/23/21 0529 03/24/21 0553 03/25/21 0500  WBC 7.3 9.9 17.6* 17.6*  NEUTROABS 4.2  --  12.1* 12.1*  HGB 9.4* 7.9* 7.9* 7.7*  HCT 27.9* 23.9* 23.5* 23.9*  MCV 86.9 89.8 89.4 90.9  PLT 142* 127* 141* 177   Cardiac Enzymes: No results for input(s): CKTOTAL, CKMB, CKMBINDEX, TROPONINI in the last 168 hours. BNP: Invalid input(s): POCBNP CBG: No results for input(s): GLUCAP in the last 168 hours. D-Dimer No results for input(s): DDIMER in the last 72 hours. Hgb A1c No results for input(s): HGBA1C in the last 72 hours. Lipid Profile No results for input(s): CHOL, HDL, LDLCALC, TRIG, CHOLHDL, LDLDIRECT in the last 72 hours. Thyroid function studies No results for input(s): TSH, T4TOTAL, T3FREE, THYROIDAB in the last 72 hours.  Invalid  input(s): FREET3 Anemia work up No results for input(s): VITAMINB12, FOLATE, FERRITIN, TIBC, IRON, RETICCTPCT in the last 72 hours. Urinalysis    Component Value Date/Time   COLORURINE YELLOW 03/23/2021 [place] 03/23/2021 0450   LABSPEC 1.014 03/23/2021 0450   PHURINE 6.0 03/23/2021 0450   GLUCOSEU NEGATIVE 03/23/2021 0450   HGBUR NEGATIVE 03/23/2021 0450   BILIRUBINUR NEGATIVE 03/23/2021 0450   KETONESUR 20 (A) 03/23/2021 0450   PROTEINUR NEGATIVE 03/23/2021 0450   UROBILINOGEN 0.2 05/13/2014 0658   NITRITE NEGATIVE 03/23/2021 0450   LEUKOCYTESUR NEGATIVE 03/23/2021 0450   Sepsis Labs Invalid input(s): PROCALCITONIN,  WBC,  LACTICIDVEN Microbiology Recent Results (from the past 240 hour(s))  Blood Culture (routine x 2)     Status: None (Preliminary result)   Collection Time: 03/22/21 12:24 PM   Specimen: Right Antecubital; Blood  Result Value Ref Range Status   Specimen Description   Final    RIGHT ANTECUBITAL BLOOD Performed at [facility], [address] [place], [place] [address]    Special Requests   Final    BOTTLES DRAWN AEROBIC AND ANAEROBIC Blood Culture results may not be optimal due to an inadequate volume of blood received in culture bottles Performed at [facility], [address] [place], [place] [address]    Culture   Final    NO  GROWTH 4 DAYS Performed at [facility], [place] [address], [place], [place] [address]    Report Status PENDING  Incomplete  Blood Culture (routine x 2)     Status: None (Preliminary result)   Collection Time: 03/22/21 12:24 PM   Specimen: Left Antecubital; Blood  Result Value Ref Range Status   Specimen Description   Final    LEFT ANTECUBITAL BLOOD Performed at [facility], [address] [place], [place] [address]    Special Requests   Final    BOTTLES DRAWN AEROBIC AND ANAEROBIC Blood Culture results may not be optimal due to an inadequate volume of blood received  in culture bottles Performed at [facility], [address] [place], [place] [address]    Culture   Final    NO GROWTH 4 DAYS Performed at [facility], [place] [address] [place], [place] [address]    Report Status PENDING  Incomplete  Resp Panel by RT-PCR (Flu A&B, Covid) Nasopharyngeal Swab     Status: None   Collection Time: 03/22/21  2:13 PM   Specimen: Nasopharyngeal Swab; Nasopharyngeal(NP) swabs in vial transport medium  Result Value Ref Range Status   SARS Coronavirus 2 by RT PCR NEGATIVE NEGATIVE Final    Comment: (NOTE) SARS-CoV-2 target nucleic acids are NOT DETECTED.  The SARS-CoV-2 RNA is generally detectable in upper respiratory specimens during the acute phase of infection. The lowest concentration of SARS-CoV-2 viral copies this assay can detect is 138 copies/mL. A negative result does not preclude SARS-Cov-2 infection and should not be used as the sole basis for treatment or other patient management decisions. A negative result may occur with  improper specimen collection/handling, submission of specimen other than nasopharyngeal swab, presence of viral mutation(s) within the areas targeted by this assay, and inadequate number of viral copies(<138 copies/mL). A negative result must be combined with clinical observations, patient history, and epidemiological information. The expected result is Negative.  Fact Sheet for Patients:  EntrepreneurPulse.com.au  Fact Sheet for Healthcare Providers:  IncredibleEmployment.be  This test is no t yet approved or cleared by the [place] FDA and  has been authorized for detection and/or diagnosis of SARS-CoV-2 by FDA under an Emergency Use Authorization (EUA). This EUA will remain  in effect (meaning this test can be used) for the duration of the COVID-19 declaration under Section 564(b)(1) of the Act, 21 U.S.C.section 360bbb-3(b)(1), unless the authorization is  terminated  or revoked sooner.       Influenza A by PCR NEGATIVE NEGATIVE Final   Influenza B by PCR NEGATIVE NEGATIVE Final    Comment: (NOTE) The Xpert Xpress SARS-CoV-2/FLU/RSV plus assay is intended as an aid in the diagnosis of influenza from Nasopharyngeal swab specimens and should not be used as a sole basis for treatment. Nasal washings and aspirates are unacceptable for Xpert Xpress SARS-CoV-2/FLU/RSV testing.  Fact Sheet for Patients: EntrepreneurPulse.com.au  Fact Sheet for Healthcare Providers: IncredibleEmployment.be  This test is not yet approved or cleared by the [place] FDA and has been authorized for detection and/or diagnosis of SARS-CoV-2 by FDA under an Emergency Use Authorization (EUA). This EUA will remain in effect (meaning this test can be used) for the duration of the COVID-19 declaration under Section 564(b)(1) of the Act, 21 U.S.C. section 360bbb-3(b)(1), unless the authorization is terminated or revoked.  Performed at [facility], [address], [place], [place] [address]   Urine Culture  Status: None   Collection Time: 03/22/21  6:36 PM   Specimen: Urine, Random  Result Value Ref Range Status   Specimen Description   Final    URINE, RANDOM Performed at [facility], [address] [place], [place] [address]    Special Requests   Final    NONE Performed at [facility], [address] [place], [place] [address]    Culture   Final    NO GROWTH Performed at [facility], [place] [address] [place], [place] [address]    Report Status 03/24/2021 FINAL  Final     Time coordinating discharge: Over 30 minutes  SIGNED:   Donnamarie Poag [place] DO  [facility] 03/26/2021, 1:05 PM

## 2021-03-26 NOTE — Progress Notes (Signed)
PIV d/c'd. Port deaccessed by Agricultural consultant. Tele removed. D/c instructions reviewed. Pt concerned about temperature at home. Recheck temp 100.0 oral. Advised to continue taking tylenol as prescribed for fever >100.4 per d/c instructions. Awaiting daughter to arrive to take pt home.

## 2021-03-26 NOTE — Plan of Care (Signed)
Patient reported that he twisted his left ankle when he got up to use the bathroom. Reported pain to the ball of his right foot. Denies any pain on the left ankle. Prn IV morphine , IV zofran, and cough medicine administered per patient request. Problem: Education: Goal: Knowledge of General Education information will improve Description: Including pain rating scale, medication(s)/side effects and non-pharmacologic comfort measures Outcome: Progressing   Problem: Health Behavior/Discharge Planning: Goal: Ability to manage health-related needs will improve Outcome: Progressing   Problem: Clinical Measurements: Goal: Ability to maintain clinical measurements within normal limits will improve Outcome: Progressing Goal: Will remain free from infection Outcome: Progressing Goal: Diagnostic test results will improve Outcome: Progressing Goal: Respiratory complications will improve Outcome: Progressing Goal: Cardiovascular complication will be avoided Outcome: Progressing   Problem: Activity: Goal: Risk for activity intolerance will decrease Outcome: Progressing   Problem: Nutrition: Goal: Adequate nutrition will be maintained Outcome: Progressing   Problem: Coping: Goal: Level of anxiety will decrease Outcome: Progressing   Problem: Elimination: Goal: Will not experience complications related to bowel motility Outcome: Progressing Goal: Will not experience complications related to urinary retention Outcome: Progressing   Problem: Pain Managment: Goal: General experience of comfort will improve Outcome: Progressing   Problem: Safety: Goal: Ability to remain free from injury will improve Outcome: Progressing   Problem: Skin Integrity: Goal: Risk for impaired skin integrity will decrease Outcome: Progressing

## 2021-03-28 LAB — CULTURE, BLOOD (ROUTINE X 2)
Culture: NO GROWTH
Culture: NO GROWTH

## 2021-03-30 ENCOUNTER — Telehealth: Payer: Self-pay | Admitting: Internal Medicine

## 2021-03-30 ENCOUNTER — Inpatient Hospital Stay: Payer: Medicare Other | Attending: Physician Assistant | Admitting: Internal Medicine

## 2021-03-30 ENCOUNTER — Inpatient Hospital Stay: Payer: Medicare Other

## 2021-03-30 ENCOUNTER — Other Ambulatory Visit: Payer: Self-pay

## 2021-03-30 ENCOUNTER — Other Ambulatory Visit: Payer: Self-pay | Admitting: Internal Medicine

## 2021-03-30 ENCOUNTER — Other Ambulatory Visit: Payer: Self-pay | Admitting: Medical Oncology

## 2021-03-30 VITALS — BP 122/73 | HR 122 | Temp 98.2°F | Resp 19 | Ht 69.0 in | Wt 179.9 lb

## 2021-03-30 DIAGNOSIS — C7A8 Other malignant neuroendocrine tumors: Secondary | ICD-10-CM | POA: Diagnosis not present

## 2021-03-30 DIAGNOSIS — R000 Tachycardia, unspecified: Secondary | ICD-10-CM | POA: Insufficient documentation

## 2021-03-30 DIAGNOSIS — Z95828 Presence of other vascular implants and grafts: Secondary | ICD-10-CM

## 2021-03-30 DIAGNOSIS — D649 Anemia, unspecified: Secondary | ICD-10-CM

## 2021-03-30 DIAGNOSIS — E860 Dehydration: Secondary | ICD-10-CM | POA: Diagnosis not present

## 2021-03-30 DIAGNOSIS — C7B8 Other secondary neuroendocrine tumors: Secondary | ICD-10-CM | POA: Diagnosis not present

## 2021-03-30 DIAGNOSIS — Z5111 Encounter for antineoplastic chemotherapy: Secondary | ICD-10-CM | POA: Diagnosis present

## 2021-03-30 DIAGNOSIS — Z5189 Encounter for other specified aftercare: Secondary | ICD-10-CM | POA: Insufficient documentation

## 2021-03-30 DIAGNOSIS — C7A1 Malignant poorly differentiated neuroendocrine tumors: Secondary | ICD-10-CM

## 2021-03-30 DIAGNOSIS — C3491 Malignant neoplasm of unspecified part of right bronchus or lung: Secondary | ICD-10-CM

## 2021-03-30 LAB — CMP (CANCER CENTER ONLY)
ALT: 35 U/L (ref 0–44)
AST: 27 U/L (ref 15–41)
Albumin: 2.5 g/dL — ABNORMAL LOW (ref 3.5–5.0)
Alkaline Phosphatase: 85 U/L (ref 38–126)
Anion gap: 10 (ref 5–15)
BUN: 15 mg/dL (ref 8–23)
CO2: 25 mmol/L (ref 22–32)
Calcium: 9.5 mg/dL (ref 8.9–10.3)
Chloride: 104 mmol/L (ref 98–111)
Creatinine: 1 mg/dL (ref 0.61–1.24)
GFR, Estimated: >60 mL/min (ref >60)
Glucose, Bld: 110 mg/dL — ABNORMAL HIGH (ref 70–99)
Potassium: 4.3 mmol/L (ref 3.5–5.1)
Sodium: 139 mmol/L (ref 135–145)
Total Bilirubin: 0.3 mg/dL (ref 0.3–1.2)
Total Protein: 7.2 g/dL (ref 6.5–8.1)

## 2021-03-30 LAB — CBC WITH DIFFERENTIAL (CANCER CENTER ONLY)
Abs Immature Granulocytes: 0.59 10*3/uL — ABNORMAL HIGH (ref 0.00–0.07)
Basophils Absolute: 0.1 10*3/uL (ref 0.0–0.1)
Basophils Relative: 0 %
Eosinophils Absolute: 0 10*3/uL (ref 0.0–0.5)
Eosinophils Relative: 0 %
HCT: 24.4 % — ABNORMAL LOW (ref 39.0–52.0)
Hemoglobin: 8 g/dL — ABNORMAL LOW (ref 13.0–17.0)
Immature Granulocytes: 3 %
Lymphocytes Relative: 8 %
Lymphs Abs: 1.6 10*3/uL (ref 0.7–4.0)
MCH: 28.7 pg (ref 26.0–34.0)
MCHC: 32.8 g/dL (ref 30.0–36.0)
MCV: 87.5 fL (ref 80.0–100.0)
Monocytes Absolute: 1.3 10*3/uL — ABNORMAL HIGH (ref 0.1–1.0)
Monocytes Relative: 6 %
Neutro Abs: 16.9 10*3/uL — ABNORMAL HIGH (ref 1.7–7.7)
Neutrophils Relative %: 83 %
Platelet Count: 694 10*3/uL — ABNORMAL HIGH (ref 150–400)
RBC: 2.79 MIL/uL — ABNORMAL LOW (ref 4.22–5.81)
RDW: 14.4 % (ref 11.5–15.5)
WBC Count: 20.5 10*3/uL — ABNORMAL HIGH (ref 4.0–10.5)
nRBC: 0 % (ref 0.0–0.2)

## 2021-03-30 LAB — MAGNESIUM: Magnesium: 1.7 mg/dL (ref 1.7–2.4)

## 2021-03-30 MED ORDER — MAGNESIUM SULFATE 2 GM/50ML IV SOLN
INTRAVENOUS | Status: AC
  Filled 2021-03-30: qty 50

## 2021-03-30 MED ORDER — SODIUM CHLORIDE 0.9 % IV SOLN
10.0000 mg | Freq: Once | INTRAVENOUS | Status: AC
  Administered 2021-03-30: 10 mg via INTRAVENOUS
  Filled 2021-03-30: qty 10

## 2021-03-30 MED ORDER — SODIUM CHLORIDE 0.9% FLUSH
10.0000 mL | INTRAVENOUS | Status: AC | PRN
  Administered 2021-03-30: 10 mL
  Filled 2021-03-30: qty 10

## 2021-03-30 MED ORDER — SODIUM CHLORIDE 0.9 % IV SOLN
120.0000 mg/m2 | Freq: Once | INTRAVENOUS | Status: AC
  Administered 2021-03-30: 250 mg via INTRAVENOUS
  Filled 2021-03-30: qty 12.5

## 2021-03-30 MED ORDER — SODIUM CHLORIDE 0.9 % IV SOLN
Freq: Once | INTRAVENOUS | Status: AC
  Filled 2021-03-30: qty 250

## 2021-03-30 MED ORDER — MAGNESIUM SULFATE 2 GM/50ML IV SOLN
2.0000 g | Freq: Once | INTRAVENOUS | Status: AC
  Administered 2021-03-30: 2 g via INTRAVENOUS

## 2021-03-30 MED ORDER — PALONOSETRON HCL INJECTION 0.25 MG/5ML
0.2500 mg | Freq: Once | INTRAVENOUS | Status: AC
  Administered 2021-03-30: 0.25 mg via INTRAVENOUS

## 2021-03-30 MED ORDER — SODIUM CHLORIDE 0.9 % IV SOLN
60.0000 mg/m2 | Freq: Once | INTRAVENOUS | Status: AC
  Administered 2021-03-30: 123 mg via INTRAVENOUS
  Filled 2021-03-30: qty 123

## 2021-03-30 MED ORDER — SODIUM CHLORIDE 0.9% FLUSH
10.0000 mL | INTRAVENOUS | Status: DC | PRN
  Administered 2021-03-30: 10 mL
  Filled 2021-03-30: qty 10

## 2021-03-30 MED ORDER — SODIUM CHLORIDE 0.9 % IV SOLN
150.0000 mg | Freq: Once | INTRAVENOUS | Status: AC
  Administered 2021-03-30: 150 mg via INTRAVENOUS
  Filled 2021-03-30: qty 150

## 2021-03-30 MED ORDER — PALONOSETRON HCL INJECTION 0.25 MG/5ML
INTRAVENOUS | Status: AC
  Filled 2021-03-30: qty 5

## 2021-03-30 MED ORDER — HYDROCODONE BIT-HOMATROP MBR 5-1.5 MG/5ML PO SOLN: 5.0000 mL | Freq: Four times a day (QID) | ORAL | 0 refills | Status: DC | PRN

## 2021-03-30 MED ORDER — SODIUM CHLORIDE 0.9 % IV SOLN
INTRAVENOUS | Status: DC
  Filled 2021-03-30 (×2): qty 250

## 2021-03-30 MED ORDER — POTASSIUM CHLORIDE IN NACL 20-0.9 MEQ/L-% IV SOLN
INTRAVENOUS | Status: DC
  Filled 2021-03-30 (×2): qty 1000

## 2021-03-30 MED ORDER — HEPARIN SOD (PORK) LOCK FLUSH 100 UNIT/ML IV SOLN
500.0000 [IU] | Freq: Once | INTRAVENOUS | Status: AC | PRN
Start: 2021-03-30 — End: 2021-03-30
  Administered 2021-03-30: 500 [IU]
  Filled 2021-03-30: qty 5

## 2021-03-30 NOTE — Progress Notes (Unsigned)
Per Dr. Worthy Flank note from today.Anthony KitchenMarland KitchenOk to treat. "We will proceed with cycle #3 today as planned. For the chemotherapy-induced anemia, I will arrange for the patient to receive 2 units of PRBCs transfusion this week. For the tachycardia and dehydration, will arrange for the patient to receive 1 L of normal saline in the clinic during treatment today."

## 2021-03-30 NOTE — Telephone Encounter (Signed)
Scheduled appts per 8/1 sch msg. Pt is in infusion today. I spoke to [employer] who said she will print an updated calendar for pt before he leaves today.

## 2021-03-30 NOTE — Patient Instructions (Signed)
[facility]  Discharge Instructions: Thank you for choosing [facility] to provide your oncology and hematology care.   If you have a lab appointment with the [facility], please go directly to the [facility] and check in at the registration area.   Wear comfortable clothing and clothing appropriate for easy access to any Portacath or PICC line.   We strive to give you quality time with your provider. You may need to reschedule your appointment if you arrive late (15 or more minutes).  Arriving late affects you and other patients whose appointments are after yours.  Also, if you miss three or more appointments without notifying the office, you may be dismissed from the clinic at the provider's discretion.      For prescription refill requests, have your pharmacy contact our office and allow 72 hours for refills to be completed.    Today you received the following chemotherapy and/or immunotherapy agents: cisplatin, etoposide.    To help prevent nausea and vomiting after your treatment, we encourage you to take your nausea medication as directed.  BELOW ARE SYMPTOMS THAT SHOULD BE REPORTED IMMEDIATELY: *FEVER GREATER THAN 100.4 F (38 C) OR HIGHER *CHILLS OR SWEATING *NAUSEA AND VOMITING THAT IS NOT CONTROLLED WITH YOUR NAUSEA MEDICATION *UNUSUAL SHORTNESS OF BREATH *UNUSUAL BRUISING OR BLEEDING *URINARY PROBLEMS (pain or burning when urinating, or frequent urination) *BOWEL PROBLEMS (unusual diarrhea, constipation, pain near the anus) TENDERNESS IN MOUTH AND THROAT WITH OR WITHOUT PRESENCE OF ULCERS (sore throat, sores in mouth, or a toothache) UNUSUAL RASH, SWELLING OR PAIN  UNUSUAL VAGINAL DISCHARGE OR ITCHING   Items with * indicate a potential emergency and should be followed up as soon as possible or go to the Emergency Department if any problems should occur.  Please show the CHEMOTHERAPY ALERT CARD or IMMUNOTHERAPY ALERT CARD at  check-in to the Emergency Department and triage nurse.  Should you have questions after your visit or need to cancel or reschedule your appointment, please contact [facility]  Dept: [phone]  and follow the prompts.  Office hours are 8:00 a.m. to 4:30 p.m. Monday - Friday. Please note that voicemails left after 4:00 p.m. may not be returned until the following business day.  We are closed weekends and major holidays. You have access to a nurse at all times for urgent questions. Please call the main number to the clinic Dept: [phone] and follow the prompts.   For any non-urgent questions, you may also contact your provider using MyChart. We now offer e-Visits for anyone 96 and older to request care online for non-urgent symptoms. For details visit [email or web address].   Also download the MyChart app! Go to the app store, search "MyChart", open the app, select [facility], and log in with your MyChart username and password.  Due to Covid, a mask is required upon entering the hospital/clinic. If you do not have a mask, one will be given to you upon arrival. For doctor visits, patients may have 1 support person aged 50 or older with them. For treatment visits, patients cannot have anyone with them due to current Covid guidelines and our immunocompromised population.

## 2021-03-30 NOTE — Progress Notes (Signed)
BB orders entered for 08/030 and transfusion on Thursday aug 4 at [facility].

## 2021-03-30 NOTE — Progress Notes (Signed)
[name] [phone]   [phone]  OFFICE PROGRESS NOTE  [name], MD [place] [address]  DIAGNOSIS:  Stage IV (pT1c, N1, M1b) large cell neuroendocrine carcinoma.  He presented with a right upper lobe lung nodule and hilar lymphadenopathy.  He was diagnosed in April 2022. He also had a small metastatic brain lesion.    PDL1: 0%   PRIOR THERAPY: Robotic assisted thoracic surgery with a right upper lobectomy which was performed on 12/24/2020.     CURRENT THERAPY: Adjuvant chemotherapy with cisplatin 80 mg/m2 on day 1, etoposide 100 mg per metered squared on days 1, 2, and 3, IV every 3 weeks. First dose on 02/09/21.  Status post 2 cycles.  INTERVAL HISTORY: [name] 65 y.o. male returns to the clinic today for follow-up visit.  The patient continues to complain of increasing fatigue and weakness as well as tachycardia secondary to dehydration and anemia.  He denied having any current chest pain, shortness of breath, or hemoptysis but has cough and requesting refill of his cough medication.  He denied having any current fever or chills.  He has no nausea, vomiting, diarrhea or constipation.  He was recently admitted to the hospital with suspicious community-acquired pneumonia and sepsis treated with cefepime and discharge home with azithromycin and cefdinir.  The patient feels a little bit better today is here for evaluation before starting cycle #3.  MEDICAL HISTORY: Past Medical History:  Diagnosis Date   Anxiety    Arthritis    Basal cell carcinoma (BCC) of forehead    CAD (coronary artery disease)    a. 10/2015 ant STEMI >> LHC with 3 v CAD; oLAD tx with POBA >> emergent CABG. b. Multiple evals since that time, early graft failure of SVG-RCA by cath 03/2016. c. 2/19 PCI/DES x1 to pRCA, normal EF.   Carotid artery disease ([place])    a. 40-59% BICA 02/2018.   Depression    Dyspnea    Ectopic atrial  tachycardia (HCC)    Esophageal reflux    eosinophil esophagitis   Family history of adverse reaction to anesthesia    "sister has PONV" (06/21/2017)   Former tobacco use    Gout    Hepatitis C    "treated and cured" (06/21/2017)   High cholesterol    History of kidney stones    Hypertension    Ischemic cardiomyopathy    a. EF 25-30% at intraop TEE 4/17  //  b. Limited Echo 5/17 - EF 45-50%, mild ant HK. c. EF 55-65% by cath 09/2017.   Migraine    "3-4/yr" (06/21/2017)   Myocardial infarction ([place]) 10/2015   Palpitations    Sinus bradycardia    a. HR dropping into 40s in 02/2016 -> BB reduced.   Stroke [facility]) 10/2016   "small one; sometimes my memory/cognitive issues" (06/21/2017)   Symptomatic hypotension    a. 02/2016 ER visit -> meds reduced.   Syncope    Wears dentures    Wears glasses     ALLERGIES:  is allergic to prednisone, tetanus toxoids, wellbutrin [bupropion], indomethacin, other, and varenicline.  MEDICATIONS:  Current Outpatient Medications  Medication Sig Dispense Refill   acetaminophen (TYLENOL) 500 MG tablet Take 1,000 mg by mouth every 6 (six) hours as needed for moderate pain.     albuterol (VENTOLIN HFA) 108 (90 Base) MCG/ACT inhaler Inhale 2 puffs into the lungs every 6 (six) hours as  needed for wheezing or shortness of breath. 8 g 2   amiodarone (PACERONE) 200 MG tablet Take 1 tablet (200 mg total) by mouth daily. 30 tablet 1   amLODipine (NORVASC) 10 MG tablet TAKE 1 TABLET(10 MG) BY MOUTH DAILY (Patient taking differently: Take 10 mg by mouth daily.) 90 tablet 1   aspirin EC 81 MG tablet Take 81 mg by mouth daily. Swallow whole.     atorvastatin (LIPITOR) 80 MG tablet TAKE 1 TABLET(80 MG) BY MOUTH DAILY (Patient taking differently: Take 80 mg by mouth daily.) 90 tablet 2   cefdinir (OMNICEF) 300 MG capsule Take 1 capsule (300 mg total) by mouth 2 (two) times daily for 6 days. 12 capsule 0   cloNIDine (CATAPRES) 0.1 MG tablet Take 0.1 mg by mouth daily as  needed (sleep).     clopidogrel (PLAVIX) 75 MG tablet TAKE 1 TABLET BY MOUTH EVERY DAY (Patient taking differently: Take 75 mg by mouth daily.) 90 tablet 1   Diphenhyd-Hydrocort-Nystatin (FIRST-DUKES MOUTHWASH MT) Use as directed 5 mLs in the mouth or throat 4 (four) times daily -  before meals and at bedtime.     HYDROcodone bit-homatropine (HYCODAN) 5-1.5 MG/5ML syrup Take 5 mLs by mouth every 6 (six) hours as needed for cough. 120 mL 0   lidocaine-prilocaine (EMLA) cream Apply 1 application topically once as needed (port access).     magnesium oxide (MAG-OX) 400 (240 Mg) MG tablet Take 1 tablet (400 mg total) by mouth daily. 30 tablet 1   metoprolol succinate (TOPROL-XL) 25 MG 24 hr tablet Take 1 tablet (25 mg total) by mouth daily. 90 tablet 3   nitroGLYCERIN (NITROSTAT) 0.4 MG SL tablet PLACE 1 TABLET UNDER THE TONGUE EVERY 5 MINUTES AS NEEDED FOR CHEST PAIN. 3 DOSES MAX 25 tablet 4   ondansetron (ZOFRAN) 8 MG tablet Take 1 tablet (8 mg total) by mouth every 8 (eight) hours as needed for nausea or vomiting. Starting 3 days after chemotherapy 30 tablet 2   oxyCODONE (OXY IR/ROXICODONE) 5 MG immediate release tablet Take 1 tablet (5 mg total) by mouth every 4 (four) hours as needed for moderate pain. 30 tablet 0   prochlorperazine (COMPAZINE) 10 MG tablet Take 1 tablet (10 mg total) by mouth every 6 (six) hours as needed. 30 tablet 2   ranolazine (RANEXA) 1000 MG SR tablet Take 1 tablet (1,000 mg total) by mouth 2 (two) times daily. (Patient taking differently: Take 1,000 mg by mouth daily.) 180 tablet 0   Tiotropium Bromide-Olodaterol (STIOLTO RESPIMAT) 2.5-2.5 MCG/ACT AERS Inhale 2 puffs into the lungs daily. 4 g 3   No current facility-administered medications for this visit.    SURGICAL HISTORY:  Past Surgical History:  Procedure Laterality Date   25 GAUGE PARS PLANA VITRECTOMY WITH 20 GAUGE MVR PORT  12/31/2020   Procedure: 25 GAUGE PARS PLANA VITRECTOMY WITH 20 GAUGE MVR PORT;  Surgeon:  [name], MD;  Location: [facility];  Service: Thoracic;;   ANTERIOR CERVICAL DECOMP/DISCECTOMY FUSION N/A 10/17/2018   Procedure: Anterior Cervical Decompression Fusion - Cervical seven -Thoracic one;  Surgeon: Consuella Lose, MD;  Location: [facility];  Service: Neurosurgery;  Laterality: N/A;   BASAL CELL CARCINOMA EXCISION     "forehead   BIOPSY  07/20/2019   Procedure: BIOPSY;  Surgeon: Carol Ada, MD;  Location: [facility];  Service: Endoscopy;;   BRONCHIAL BIOPSY  12/24/2020   Procedure: BRONCHIAL BIOPSIES;  Surgeon: Garner Nash, DO;  Location: [place];  Service:  Pulmonary;;   BRONCHIAL BRUSHINGS  12/24/2020   Procedure: BRONCHIAL BRUSHINGS;  Surgeon: Garner Nash, DO;  Location: [facility];  Service: Pulmonary;;   BRONCHIAL NEEDLE ASPIRATION BIOPSY  12/24/2020   Procedure: BRONCHIAL NEEDLE ASPIRATION BIOPSIES;  Surgeon: Garner Nash, DO;  Location: [place];  Service: Pulmonary;;   CARDIAC CATHETERIZATION N/A 11/28/2015   Procedure: Left Heart Cath and Coronary Angiography;  Surgeon: [name], MD;  Location: [place] [facility];  Service: Cardiovascular;  Laterality: N/A;   CARDIAC CATHETERIZATION N/A 11/28/2015   Procedure: Coronary Balloon Angioplasty;  Surgeon: [name], MD;  Location: [place] [facility];  Service: Cardiovascular;  Laterality: N/A;  ostial LAD   CARDIAC CATHETERIZATION N/A 11/28/2015   Procedure: Coronary/Graft Angiography;  Surgeon: [name], MD;  Location: [place] [facility];  Service: Cardiovascular;  Laterality: N/A;  coronaries only    CARDIAC CATHETERIZATION N/A 04/21/2016   Procedure: Left Heart Cath and Coronary Angiography;  Surgeon: Wellington Hampshire, MD;  Location: [place] [facility];  Service: Cardiovascular;  Laterality: N/A;   CARDIAC CATHETERIZATION N/A 06/14/2016   Procedure: Left Heart Cath and Cors/Grafts Angiography;  Surgeon: Lorretta Harp, MD;  Location: [place] [facility];  Service:  Cardiovascular;  Laterality: N/A;   CARDIAC CATHETERIZATION N/A 09/08/2016   Procedure: Left Heart Cath and Cors/Grafts Angiography;  Surgeon: Wellington Hampshire, MD;  Location: [place] [facility];  Service: Cardiovascular;  Laterality: N/A;   CARDIAC CATHETERIZATION     CORONARY ARTERY BYPASS GRAFT N/A 11/28/2015   Procedure: CORONARY ARTERY BYPASS GRAFTING (CABG) TIMES FIVE USING LEFT INTERNAL MAMMARY ARTERY AND RIGHT GREATER SAPHENOUS,VIEN HARVEATED BY ENDOVIEN, INTRAOPPRATIVE TEE;  Surgeon: Gaye Pollack, MD;  Location: [facility];  Service: Open Heart Surgery;  Laterality: N/A;   CORONARY STENT INTERVENTION N/A 10/05/2017   Procedure: CORONARY STENT INTERVENTION;  Surgeon: [name], MD;  Location: [place] [facility];  Service: Cardiovascular;  Laterality: N/A;   ESOPHAGOGASTRODUODENOSCOPY (EGD) WITH PROPOFOL N/A 07/20/2019   Procedure: ESOPHAGOGASTRODUODENOSCOPY (EGD) WITH PROPOFOL;  Surgeon: Carol Ada, MD;  [facility];  Service: Endoscopy;  Laterality: N/A;   ESOPHAGOGASTRODUODENOSCOPY (EGD) WITH PROPOFOL N/A 01/30/2021   Procedure: ESOPHAGOGASTRODUODENOSCOPY (EGD) WITH PROPOFOL;  Surgeon: Carol Ada, MD;  [facility];  Service: Endoscopy;  Laterality: N/A;   FIDUCIAL MARKER PLACEMENT  12/24/2020   Procedure: FIDUCIAL DYE MARKER PLACEMENT;  Surgeon: Garner Nash, DO;  Location: [facility] [facility];  Service: Pulmonary;;   HUMERUS SURGERY Right 1969   "tumor inside bone; filled it w/bone chips"   INTERCOSTAL NERVE BLOCK Right 12/24/2020   Procedure: INTERCOSTAL NERVE BLOCK;  Surgeon: [name], MD;  Location: [facility];  Service: Thoracic;  Laterality: Right;   IR IMAGING GUIDED PORT INSERTION  02/06/2021   LEFT HEART CATH AND CORS/GRAFTS ANGIOGRAPHY N/A 03/11/2017   Procedure: Left Heart Cath and Cors/Grafts Angiography;  Surgeon: Leonie Man, MD;  Location: [place] [facility];  Service: Cardiovascular;  Laterality: N/A;   LEFT HEART CATH AND CORS/GRAFTS  ANGIOGRAPHY N/A 10/05/2017   Procedure: LEFT HEART CATH AND CORS/GRAFTS ANGIOGRAPHY;  Surgeon: [name], MD;  Location: [place] [facility];  Service: Cardiovascular;  Laterality: N/A;   LEFT HEART CATH AND CORS/GRAFTS ANGIOGRAPHY N/A 04/11/2019   Procedure: LEFT HEART CATH AND CORS/GRAFTS ANGIOGRAPHY;  Surgeon: [name], MD;  Location: [place] [facility];  Service: Cardiovascular;  Laterality: N/A;   NODE DISSECTION Right 12/24/2020   Procedure:  NODE DISSECTION;  Surgeon: [name], MD;  Location: [place];  Service: Thoracic;  Laterality: Right;   PERIPHERAL VASCULAR CATHETERIZATION N/A 06/14/2016   Procedure: Lower Extremity Angiography;  Surgeon: Lorretta Harp, MD;  Location: [place] [facility];  Service: Cardiovascular;  Laterality: N/A;   VIDEO BRONCHOSCOPY WITH ENDOBRONCHIAL NAVIGATION Right 12/24/2020   Procedure: VIDEO BRONCHOSCOPY WITH ENDOBRONCHIAL NAVIGATION;  Surgeon: Garner Nash, DO;  Location: [place];  Service: Pulmonary;  Laterality: Right;   VIDEO BRONCHOSCOPY WITH ENDOBRONCHIAL ULTRASOUND N/A 12/24/2020   Procedure: VIDEO BRONCHOSCOPY WITH ENDOBRONCHIAL ULTRASOUND;  Surgeon: Garner Nash, DO;  Location: [place];  Service: Pulmonary;  Laterality: N/A;   VIDEO BRONCHOSCOPY WITH INSERTION OF INTERBRONCHIAL VALVE (IBV) N/A 12/31/2020   Procedure: VIDEO BRONCHOSCOPY WITH INSERTION OF INTERBRONCHIAL VALVE (IBV).VALVE IN CARTRIDGE 67mm,9mm. CHEST TUBE PLACEMENT.;  Surgeon: [name], MD;  [facility];  Service: Thoracic;  Laterality: N/A;    REVIEW OF SYSTEMS:  Constitutional: positive for fatigue Eyes: negative Ears, nose, mouth, throat, and face: negative Respiratory: positive for cough and dyspnea on exertion Cardiovascular: negative Gastrointestinal: negative Genitourinary:negative Integument/breast: negative Hematologic/lymphatic: negative Musculoskeletal:negative Neurological: negative Behavioral/Psych:  negative Endocrine: negative Allergic/Immunologic: negative   PHYSICAL EXAMINATION: General appearance: alert, cooperative, fatigued, and no distress Head: Normocephalic, without obvious abnormality, atraumatic Neck: no adenopathy, no JVD, supple, symmetrical, trachea midline, and thyroid not enlarged, symmetric, no tenderness/mass/nodules Lymph nodes: Cervical, supraclavicular, and axillary nodes normal. Resp: clear to auscultation bilaterally Back: negative Cardio: Tachycardic GI: soft, non-tender; bowel sounds normal; no masses,  no organomegaly Extremities: extremities normal, atraumatic, no cyanosis or edema Neurologic: Alert and oriented X 3, normal strength and tone. Normal symmetric reflexes. Normal coordination and gait  ECOG PERFORMANCE STATUS: 1 - Symptomatic but completely ambulatory  Blood pressure 122/73, pulse (!) 122, temperature 98.2 F (36.8 C), temperature source Tympanic, resp. rate 19, height $RemoveBe'5\' 9"'XJnPjrswF$  (1.753 m), weight 179 lb 14.4 oz (81.6 kg), SpO2 98 %.  LABORATORY DATA: Lab Results  Component Value Date   WBC 20.5 (H) 03/30/2021   HGB 8.0 (L) 03/30/2021   HCT 24.4 (L) 03/30/2021   MCV 87.5 03/30/2021   PLT 694 (H) 03/30/2021      Chemistry      Component Value Date/Time   NA 138 03/25/2021 0500   K 3.6 03/25/2021 0500   CL 102 03/25/2021 0500   CO2 27 03/25/2021 0500   BUN 15 03/25/2021 0500   CREATININE 1.02 03/25/2021 0500   CREATININE 1.19 03/16/2021 0843   CREATININE 1.00 02/12/2016 1038      Component Value Date/Time   CALCIUM 8.8 (L) 03/25/2021 0500   ALKPHOS 76 03/25/2021 0500   AST 54 (H) 03/25/2021 0500   AST 14 (L) 03/16/2021 0843   ALT 60 (H) 03/25/2021 0500   ALT 16 03/16/2021 0843   BILITOT 0.3 03/25/2021 0500   BILITOT 1.1 03/16/2021 0843       RADIOGRAPHIC STUDIES: CT Angio Chest PE W and/or Wo Contrast  Result Date: 02/28/2021 CLINICAL DATA:  Neuroendocrine lung carcinoma with brain metastases, currently on chemo Per EMS,  Pt, from home, c/o SOB w/ exertion and chronic upper abdominal pain. Pt was discharged yesterday. EXAM: CT ANGIOGRAPHY CHEST WITH CONTRAST TECHNIQUE: Multidetector CT imaging of the chest was performed using the standard protocol during bolus administration of intravenous contrast. Multiplanar CT image reconstructions and MIPs were obtained to evaluate the vascular anatomy. CONTRAST:  67mL OMNIPAQUE IOHEXOL 350 MG/ML SOLN COMPARISON:  CT angiography chest 02/24/2021,  02/14/2021 FINDINGS: Cardiovascular: Fairly satisfactory opacification of the pulmonary arteries to the segmental level. Limited evaluation of the subsegmental level due to motion artifact. No evidence of pulmonary embolism. The main pulmonary artery is normal in caliber. Normal heart size. No significant pericardial effusion. The thoracic aorta is stable in caliber measuring up to 3.8 cm on axial imaging (5:135). At least mild atherosclerotic plaque of the thoracic aorta. Coronary artery calcifications status post coronary artery bypass graft. Mediastinum/Nodes: No enlarged mediastinal, hilar, or axillary lymph nodes. Thyroid gland, trachea, and esophagus demonstrate no significant findings. Lungs/Pleura: Status post right upper lobectomy. Persistent mucous plugging noted within the right lower lobe. Interval decrease in size of a trace right pleural effusion. Passive atelectasis of the right lower lobe. No left pleural effusion. Linear atelectasis of the right middle lobe. No focal consolidation. No pulmonary nodule, no pulmonary mass. Upper Abdomen: No acute abnormality. A couple of splenules are again noted and partially visualized (282, 5:318) Musculoskeletal: No chest wall abnormality. No suspicious lytic or blastic osseous lesions. No acute displaced fracture. Multilevel degenerative changes of the spine. Anterior surgical hardware of the C7-T1 level. Review of the MIP images confirms the above findings. IMPRESSION: 1. No pulmonary embolus. 2.  Interval decrease in trace right pleural effusion in a patient status post right upper lobectomy. 3. Persistent right lower lobe bronchial debris/mucous plugging. No associated parenchymal or interstitial infection. 4.  Aortic Atherosclerosis (ICD10-I70.0). Electronically Signed   By: Iven Finn M.D.   On: 02/28/2021 23:12   DG Chest Port 1 View  Result Date: 03/22/2021 CLINICAL DATA:  Fever and chills starting last night. Ongoing chemotherapy for lung cancer. EXAM: PORTABLE CHEST 1 VIEW COMPARISON:  02/28/2021 FINDINGS: Fall injectable right Port-A-Cath tip: Right atrium. Prior CABG. Prior right upper lobectomy with expected volume loss in the right hemithorax. Lower cervical plate and screw fixator. Borderline enlargement of the cardiopericardial silhouette, without edema. There is some mild blunting of the right lateral costophrenic angle along with some progressive density at the right lung base compared to 02/28/2021; some of this may be from the patient's known pleural effusion but a component of early pneumonia is difficult to exclude given the patient's symptoms. IMPRESSION: 1. Increased density at the right lung base. A component of this may be due to the patient's small right pleural effusion, but early pneumonia at the right lung base is difficult to exclude given the change from 02/28/2021. 2. Prior right upper lobectomy. 3. Borderline enlargement of the cardiopericardial silhouette, without edema. 4. Electronically Signed   By: Van Clines M.D.   On: 03/22/2021 12:17   DG Chest Port 1 View  Result Date: 02/28/2021 CLINICAL DATA:  Pt c/o SOB w/ exertion and chronic upper abdominal pain. H/o CAD, Stroke, Myocardial Infarction. Former smoker. dyspnea EXAM: PORTABLE CHEST 1 VIEW COMPARISON:  02/25/2021 FINDINGS: Port in the anterior chest wall with tip in distal SVC. Sternotomy wires overlie normal cardiac silhouette. Normal pulmonary vasculature. Small chronic RIGHT effusion. No  infiltrate or pneumothorax. No acute osseous abnormality. Anterior cervical fusion IMPRESSION: 1. Small chronic RIGHT effusion. 2. No pneumonia or edema. Electronically Signed   By: Suzy Bouchard M.D.   On: 02/28/2021 19:19   DG Foot Complete Left  Result Date: 03/26/2021 CLINICAL DATA:  Left foot pain. EXAM: LEFT FOOT - COMPLETE 3+ VIEW COMPARISON:  08/28/2008. FINDINGS: No acute bony or joint abnormality identified. No evidence of fracture dislocation. No radiopaque foreign body. IMPRESSION: No acute abnormality. Electronically Signed   By: Marcello Moores  Register   On: 03/26/2021 10:45    ASSESSMENT AND PLAN: This is a very pleasant 65 years old white male recently diagnosed with a stage IV (T1c, N1, M1 B) large cell neuroendocrine carcinoma presented with right upper lobe lung nodule in addition to right hilar adenopathy in April 2022 and the patient was found to have solitary brain metastasis in May 2022 He is status post right upper lobectomy with lymph node sampling under the care of Dr. Kipp Brood. The patient is currently undergoing systemic chemotherapy with cisplatin 80 mg/M2 on day 1 and etoposide 100 Mg/M2 on days 1, 2 and 3 every 3 weeks.  He is status post 2 cycles.   The patient has a rough time tolerating this treatment with frequent hospitalization and suspicious recurrent pneumonia on the right side of his lung likely remnant from the previous surgery and scarring. I had a lengthy discussion with the patient today about his condition and treatment options.  I gave the patient the option of discontinuing his adjuvant systemic chemotherapy at this point because of the rough time he has with his treatment but he would like to continue with the treatment and finishing the next 2 cycles. We will proceed with cycle #3 today as planned. For the chemotherapy-induced anemia, I will arrange for the patient to receive 2 units of PRBCs transfusion this week. For the tachycardia and dehydration, will  arrange for the patient to receive 1 L of normal saline in the clinic during treatment today. The patient will come back for follow-up visit in 3 weeks for evaluation before starting cycle #4. For pain management I will give him refill of oxycodone. For the dry cough I will give him refill of Hycodan. The patient was advised to call immediately if he has any other concerning symptoms in the interval. The patient voices understanding of current disease status and treatment options and is in agreement with the current care plan. All questions were answered. The patient knows to call the clinic with any problems, questions or concerns. We can certainly see the patient much sooner if necessary.   Disclaimer: This note was dictated with voice recognition software. Similar sounding words can inadvertently be transcribed and may not be corrected upon review.

## 2021-03-31 ENCOUNTER — Other Ambulatory Visit: Payer: Self-pay | Admitting: Internal Medicine

## 2021-03-31 ENCOUNTER — Inpatient Hospital Stay: Payer: Medicare Other

## 2021-03-31 VITALS — BP 125/68 | HR 99 | Temp 98.6°F | Resp 20

## 2021-03-31 DIAGNOSIS — Z5111 Encounter for antineoplastic chemotherapy: Secondary | ICD-10-CM | POA: Diagnosis not present

## 2021-03-31 DIAGNOSIS — C7A1 Malignant poorly differentiated neuroendocrine tumors: Secondary | ICD-10-CM

## 2021-03-31 MED ORDER — SODIUM CHLORIDE 0.9 % IV SOLN
10.0000 mg | Freq: Once | INTRAVENOUS | Status: AC
  Administered 2021-03-31: 10 mg via INTRAVENOUS
  Filled 2021-03-31: qty 10

## 2021-03-31 MED ORDER — SODIUM CHLORIDE 0.9 % IV SOLN
Freq: Once | INTRAVENOUS | Status: AC
  Filled 2021-03-31: qty 250

## 2021-03-31 MED ORDER — SODIUM CHLORIDE 0.9% FLUSH
10.0000 mL | INTRAVENOUS | Status: DC | PRN
  Administered 2021-03-31: 10 mL
  Filled 2021-03-31: qty 10

## 2021-03-31 MED ORDER — HEPARIN SOD (PORK) LOCK FLUSH 100 UNIT/ML IV SOLN
500.0000 [IU] | Freq: Once | INTRAVENOUS | Status: AC | PRN
  Administered 2021-03-31: 500 [IU]
  Filled 2021-03-31: qty 5

## 2021-03-31 MED ORDER — SODIUM CHLORIDE 0.9 % IV SOLN
120.0000 mg/m2 | Freq: Once | INTRAVENOUS | Status: AC
  Administered 2021-03-31: 250 mg via INTRAVENOUS
  Filled 2021-03-31: qty 12.5

## 2021-03-31 MED ORDER — OXYCODONE HCL 5 MG PO TABS: 5.0000 mg | ORAL_TABLET | Freq: Four times a day (QID) | ORAL | 0 refills | Status: DC | PRN

## 2021-03-31 NOTE — Patient Instructions (Signed)
[facility]  Discharge Instructions: Thank you for choosing [facility] to provide your oncology and hematology care.   If you have a lab appointment with the [facility], please go directly to the [facility] and check in at the registration area.   Wear comfortable clothing and clothing appropriate for easy access to any Portacath or PICC line.   We strive to give you quality time with your provider. You may need to reschedule your appointment if you arrive late (15 or more minutes).  Arriving late affects you and other patients whose appointments are after yours.  Also, if you miss three or more appointments without notifying the office, you may be dismissed from the clinic at the provider's discretion.      For prescription refill requests, have your pharmacy contact our office and allow 72 hours for refills to be completed.    Today you received the following chemotherapy and/or immunotherapy agent: Etoposide   To help prevent nausea and vomiting after your treatment, we encourage you to take your nausea medication as directed.  BELOW ARE SYMPTOMS THAT SHOULD BE REPORTED IMMEDIATELY: *FEVER GREATER THAN 100.4 F (38 C) OR HIGHER *CHILLS OR SWEATING *NAUSEA AND VOMITING THAT IS NOT CONTROLLED WITH YOUR NAUSEA MEDICATION *UNUSUAL SHORTNESS OF BREATH *UNUSUAL BRUISING OR BLEEDING *URINARY PROBLEMS (pain or burning when urinating, or frequent urination) *BOWEL PROBLEMS (unusual diarrhea, constipation, pain near the anus) TENDERNESS IN MOUTH AND THROAT WITH OR WITHOUT PRESENCE OF ULCERS (sore throat, sores in mouth, or a toothache) UNUSUAL RASH, SWELLING OR PAIN  UNUSUAL VAGINAL DISCHARGE OR ITCHING   Items with * indicate a potential emergency and should be followed up as soon as possible or go to the Emergency Department if any problems should occur.  Please show the CHEMOTHERAPY ALERT CARD or IMMUNOTHERAPY ALERT CARD at check-in to the  Emergency Department and triage nurse.  Should you have questions after your visit or need to cancel or reschedule your appointment, please contact [facility]  Dept: [phone]  and follow the prompts.  Office hours are 8:00 a.m. to 4:30 p.m. Monday - Friday. Please note that voicemails left after 4:00 p.m. may not be returned until the following business day.  We are closed weekends and major holidays. You have access to a nurse at all times for urgent questions. Please call the main number to the clinic Dept: [phone] and follow the prompts.   For any non-urgent questions, you may also contact your provider using MyChart. We now offer e-Visits for anyone 79 and older to request care online for non-urgent symptoms. For details visit [email or web address].   Also download the MyChart app! Go to the app store, search "MyChart", open the app, select [facility], and log in with your MyChart username and password.  Due to Covid, a mask is required upon entering the hospital/clinic. If you do not have a mask, one will be given to you upon arrival. For doctor visits, patients may have 1 support person aged 58 or older with them. For treatment visits, patients cannot have anyone with them due to current Covid guidelines and our immunocompromised population.

## 2021-04-01 ENCOUNTER — Inpatient Hospital Stay: Payer: Medicare Other

## 2021-04-01 ENCOUNTER — Other Ambulatory Visit: Payer: Self-pay | Admitting: Interventional Cardiology

## 2021-04-01 ENCOUNTER — Other Ambulatory Visit: Payer: Self-pay | Admitting: Physician Assistant

## 2021-04-01 ENCOUNTER — Telehealth: Payer: Self-pay | Admitting: Medical Oncology

## 2021-04-01 ENCOUNTER — Other Ambulatory Visit: Payer: Self-pay

## 2021-04-01 VITALS — BP 129/69 | HR 93 | Temp 98.7°F | Resp 18

## 2021-04-01 DIAGNOSIS — D649 Anemia, unspecified: Secondary | ICD-10-CM

## 2021-04-01 DIAGNOSIS — Z5111 Encounter for antineoplastic chemotherapy: Secondary | ICD-10-CM | POA: Diagnosis not present

## 2021-04-01 DIAGNOSIS — R110 Nausea: Secondary | ICD-10-CM

## 2021-04-01 DIAGNOSIS — C3491 Malignant neoplasm of unspecified part of right bronchus or lung: Secondary | ICD-10-CM

## 2021-04-01 DIAGNOSIS — C7A1 Malignant poorly differentiated neuroendocrine tumors: Secondary | ICD-10-CM

## 2021-04-01 LAB — CBC WITH DIFFERENTIAL (CANCER CENTER ONLY)
Abs Immature Granulocytes: 0.19 10*3/uL — ABNORMAL HIGH (ref 0.00–0.07)
Basophils Absolute: 0 10*3/uL (ref 0.0–0.1)
Basophils Relative: 0 %
Eosinophils Absolute: 0 10*3/uL (ref 0.0–0.5)
Eosinophils Relative: 0 %
HCT: 21.7 % — ABNORMAL LOW (ref 39.0–52.0)
Hemoglobin: 7.2 g/dL — ABNORMAL LOW (ref 13.0–17.0)
Immature Granulocytes: 1 %
Lymphocytes Relative: 3 %
Lymphs Abs: 0.6 10*3/uL — ABNORMAL LOW (ref 0.7–4.0)
MCH: 29.1 pg (ref 26.0–34.0)
MCHC: 33.2 g/dL (ref 30.0–36.0)
MCV: 87.9 fL (ref 80.0–100.0)
Monocytes Absolute: 0.7 10*3/uL (ref 0.1–1.0)
Monocytes Relative: 3 %
Neutro Abs: 19.4 10*3/uL — ABNORMAL HIGH (ref 1.7–7.7)
Neutrophils Relative %: 93 %
Platelet Count: 752 10*3/uL — ABNORMAL HIGH (ref 150–400)
RBC: 2.47 MIL/uL — ABNORMAL LOW (ref 4.22–5.81)
RDW: 14.6 % (ref 11.5–15.5)
WBC Count: 20.9 10*3/uL — ABNORMAL HIGH (ref 4.0–10.5)
nRBC: 0 % (ref 0.0–0.2)

## 2021-04-01 LAB — CMP (CANCER CENTER ONLY)
ALT: 27 U/L (ref 0–44)
AST: 22 U/L (ref 15–41)
Albumin: 2.6 g/dL — ABNORMAL LOW (ref 3.5–5.0)
Alkaline Phosphatase: 71 U/L (ref 38–126)
Anion gap: 11 (ref 5–15)
BUN: 22 mg/dL (ref 8–23)
CO2: 22 mmol/L (ref 22–32)
Calcium: 9.3 mg/dL (ref 8.9–10.3)
Chloride: 105 mmol/L (ref 98–111)
Creatinine: 0.92 mg/dL (ref 0.61–1.24)
GFR, Estimated: >60 mL/min (ref >60)
Glucose, Bld: 114 mg/dL — ABNORMAL HIGH (ref 70–99)
Potassium: 4.5 mmol/L (ref 3.5–5.1)
Sodium: 138 mmol/L (ref 135–145)
Total Bilirubin: 0.4 mg/dL (ref 0.3–1.2)
Total Protein: 7 g/dL (ref 6.5–8.1)

## 2021-04-01 LAB — PREPARE RBC (CROSSMATCH)

## 2021-04-01 LAB — MAGNESIUM: Magnesium: 1.8 mg/dL (ref 1.7–2.4)

## 2021-04-01 MED ORDER — SODIUM CHLORIDE 0.9 % IV SOLN: 8.0000 mg | Freq: Once | INTRAVENOUS | Status: DC

## 2021-04-01 MED ORDER — ONDANSETRON HCL 8 MG PO TABS: 8.0000 mg | ORAL_TABLET | Freq: Three times a day (TID) | ORAL | 2 refills | Status: DC | PRN

## 2021-04-01 MED ORDER — SODIUM CHLORIDE 0.9 % IV SOLN
Freq: Once | INTRAVENOUS | Status: AC
  Filled 2021-04-01: qty 250

## 2021-04-01 MED ORDER — SODIUM CHLORIDE 0.9% FLUSH
10.0000 mL | INTRAVENOUS | Status: DC | PRN
  Administered 2021-04-01: 10 mL
  Filled 2021-04-01: qty 10

## 2021-04-01 MED ORDER — ONDANSETRON HCL 4 MG/2ML IJ SOLN
INTRAMUSCULAR | Status: AC
  Filled 2021-04-01: qty 4

## 2021-04-01 MED ORDER — SODIUM CHLORIDE 0.9 % IV SOLN
10.0000 mg | Freq: Once | INTRAVENOUS | Status: AC
  Administered 2021-04-01: 10 mg via INTRAVENOUS
  Filled 2021-04-01: qty 10

## 2021-04-01 MED ORDER — ONDANSETRON HCL 4 MG/2ML IJ SOLN
8.0000 mg | Freq: Once | INTRAMUSCULAR | Status: AC
  Administered 2021-04-01: 8 mg via INTRAVENOUS

## 2021-04-01 MED ORDER — HEPARIN SOD (PORK) LOCK FLUSH 100 UNIT/ML IV SOLN
500.0000 [IU] | Freq: Once | INTRAVENOUS | Status: AC | PRN
  Administered 2021-04-01: 500 [IU]
  Filled 2021-04-01: qty 5

## 2021-04-01 MED ORDER — SODIUM CHLORIDE 0.9 % IV SOLN
120.0000 mg/m2 | Freq: Once | INTRAVENOUS | Status: AC
  Administered 2021-04-01: 250 mg via INTRAVENOUS
  Filled 2021-04-01: qty 12.5

## 2021-04-01 NOTE — Telephone Encounter (Signed)
Faxed blood pick up slips to [facility].Receipt obtained. Transfusion scheduled for tomorrow.

## 2021-04-01 NOTE — Patient Instructions (Signed)
[facility]  Discharge Instructions: Thank you for choosing [facility] to provide your oncology and hematology care.   If you have a lab appointment with the Cancer Center, please go directly to the Cancer Center and check in at the registration area.   Wear comfortable clothing and clothing appropriate for easy access to any Portacath or PICC line.   We strive to give you quality time with your provider. You may need to reschedule your appointment if you arrive late (15 or more minutes).  Arriving late affects you and other patients whose appointments are after yours.  Also, if you miss three or more appointments without notifying the office, you may be dismissed from the clinic at the provider's discretion.      For prescription refill requests, have your pharmacy contact our office and allow 72 hours for refills to be completed.    Today you received the following chemotherapy and/or immunotherapy agents: etoposide.     To help prevent nausea and vomiting after your treatment, we encourage you to take your nausea medication as directed.  BELOW ARE SYMPTOMS THAT SHOULD BE REPORTED IMMEDIATELY: . *FEVER GREATER THAN 100.4 F (38 C) OR HIGHER . *CHILLS OR SWEATING . *NAUSEA AND VOMITING THAT IS NOT CONTROLLED WITH YOUR NAUSEA MEDICATION . *UNUSUAL SHORTNESS OF BREATH . *UNUSUAL BRUISING OR BLEEDING . *URINARY PROBLEMS (pain or burning when urinating, or frequent urination) . *BOWEL PROBLEMS (unusual diarrhea, constipation, pain near the anus) . TENDERNESS IN MOUTH AND THROAT WITH OR WITHOUT PRESENCE OF ULCERS (sore throat, sores in mouth, or a toothache) . UNUSUAL RASH, SWELLING OR PAIN  . UNUSUAL VAGINAL DISCHARGE OR ITCHING   Items with * indicate a potential emergency and should be followed up as soon as possible or go to the Emergency Department if any problems should occur.  Please show the CHEMOTHERAPY ALERT CARD or IMMUNOTHERAPY ALERT  CARD at check-in to the Emergency Department and triage nurse.  Should you have questions after your visit or need to cancel or reschedule your appointment, please contact [facility]  Dept: [phone]  and follow the prompts.  Office hours are 8:00 a.m. to 4:30 p.m. Monday - Friday. Please note that voicemails left after 4:00 p.m. may not be returned until the following business day.  We are closed weekends and major holidays. You have access to a nurse at all times for urgent questions. Please call the main number to the clinic Dept: [phone] and follow the prompts.   For any non-urgent questions, you may also contact your provider using MyChart. We now offer e-Visits for anyone 18 and older to request care online for non-urgent symptoms. For details visit [email or web address].   Also download the MyChart app! Go to the app store, search "MyChart", open the app, select [facility], and log in with your MyChart username and password.  Due to Covid, a mask is required upon entering the hospital/clinic. If you do not have a mask, one will be given to you upon arrival. For doctor visits, patients may have 1 support person aged 18 or older with them. For treatment visits, patients cannot have anyone with them due to current Covid guidelines and our immunocompromised population.   

## 2021-04-02 ENCOUNTER — Inpatient Hospital Stay: Payer: Medicare Other

## 2021-04-02 VITALS — BP 121/76 | HR 86 | Temp 98.5°F | Resp 18

## 2021-04-02 DIAGNOSIS — D649 Anemia, unspecified: Secondary | ICD-10-CM

## 2021-04-02 DIAGNOSIS — Z5111 Encounter for antineoplastic chemotherapy: Secondary | ICD-10-CM | POA: Diagnosis not present

## 2021-04-02 DIAGNOSIS — C7A1 Malignant poorly differentiated neuroendocrine tumors: Secondary | ICD-10-CM

## 2021-04-02 MED ORDER — SODIUM CHLORIDE 0.9% FLUSH
10.0000 mL | INTRAVENOUS | Status: AC | PRN
  Administered 2021-04-02: 10 mL
  Filled 2021-04-02: qty 10

## 2021-04-02 MED ORDER — ACETAMINOPHEN 325 MG PO TABS
650.0000 mg | ORAL_TABLET | Freq: Once | ORAL | Status: AC
Start: 2021-04-02 — End: 2021-04-02
  Administered 2021-04-02: 650 mg via ORAL

## 2021-04-02 MED ORDER — ACETAMINOPHEN 325 MG PO TABS
ORAL_TABLET | ORAL | Status: AC
  Filled 2021-04-02: qty 2

## 2021-04-02 MED ORDER — DIPHENHYDRAMINE HCL 25 MG PO CAPS
25.0000 mg | ORAL_CAPSULE | Freq: Once | ORAL | Status: AC
  Administered 2021-04-02: 25 mg via ORAL

## 2021-04-02 MED ORDER — HEPARIN SOD (PORK) LOCK FLUSH 100 UNIT/ML IV SOLN
500.0000 [IU] | Freq: Every day | INTRAVENOUS | Status: AC | PRN
  Administered 2021-04-02: 500 [IU]
  Filled 2021-04-02: qty 5

## 2021-04-02 MED ORDER — PEGFILGRASTIM-CBQV 6 MG/0.6ML ~~LOC~~ SOSY
PREFILLED_SYRINGE | SUBCUTANEOUS | Status: AC
  Filled 2021-04-02: qty 0.6

## 2021-04-02 MED ORDER — SODIUM CHLORIDE 0.9% IV SOLUTION
250.0000 mL | Freq: Once | INTRAVENOUS | Status: AC
Start: 2021-04-02 — End: 2021-04-02
  Administered 2021-04-02: 250 mL via INTRAVENOUS
  Filled 2021-04-02: qty 250

## 2021-04-02 MED ORDER — PEGFILGRASTIM-CBQV 6 MG/0.6ML ~~LOC~~ SOSY
6.0000 mg | PREFILLED_SYRINGE | Freq: Once | SUBCUTANEOUS | Status: AC
  Administered 2021-04-02: 6 mg via SUBCUTANEOUS

## 2021-04-02 MED ORDER — DIPHENHYDRAMINE HCL 25 MG PO CAPS
ORAL_CAPSULE | ORAL | Status: AC
  Filled 2021-04-02: qty 1

## 2021-04-02 NOTE — Patient Instructions (Signed)
Blood Transfusion, Adult °A blood transfusion is a procedure in which you receive blood through an IV tube. You may need this procedure because of: °A bleeding disorder. °An illness. °An injury. °A surgery. °The blood may come from someone else (a donor). You may also be able to donate blood for yourself. The blood given in a transfusion is made up of different types of cells. You may get: °Red blood cells. These carry oxygen to the cells in the body. °White blood cells. These help you fight infections. °Platelets. These help your blood to clot. °Plasma. This is the liquid part of your blood. It carries proteins and other substances through the body. °If you have a clotting disorder, you may also get other types of blood products. °Tell your doctor about: °Any blood disorders you have. °Any reactions you have had during a blood transfusion in the past. °Any allergies you have. °All medicines you are taking, including vitamins, herbs, eye drops, creams, and over-the-counter medicines. °Any surgeries you have had. °Any medical conditions you have. This includes any recent fever or cold symptoms. °Whether you are pregnant or may be pregnant. °What are the risks? °Generally, this is a safe procedure. However, problems may occur. °The most common problems include: °A mild allergic reaction. This includes red, swollen areas of skin (hives) and itching. °Fever or chills. This may be the body's response to new blood cells received. This may happen during or up to 4 hours after the transfusion. °More serious problems may include: °Too much fluid in the lungs. This may cause breathing problems. °A serious allergic reaction. This includes breathing trouble or swelling around the face and lips. °Lung injury. This causes breathing trouble and low oxygen in the blood. This can happen within hours of the transfusion or days later. °Too much iron. This can happen after getting many blood transfusions over a period of time. °An  infection or virus passed through the blood. This is rare. Donated blood is carefully tested before it is given. °Your body's defense system (immune system) trying to attack the new blood cells. This is rare. Symptoms may include fever, chills, nausea, low blood pressure, and low back or chest pain. °Donated cells attacking healthy tissues. This is rare. °What happens before the procedure? °Medicines °Ask your doctor about: °Changing or stopping your normal medicines. This is important. °Taking aspirin and ibuprofen. Do not take these medicines unless your doctor tells you to take them. °Taking over-the-counter medicines, vitamins, herbs, and supplements. °General instructions °Follow instructions from your doctor about what you cannot eat or drink. °You will have a blood test to find out your blood type. The test also finds out what type of blood your body will accept and matches it to the donor type. °If you are going to have a planned surgery, you may be able to donate your own blood. This may be done in case you need a transfusion. °You will have your temperature, blood pressure, and pulse checked. °You may receive medicine to help prevent an allergic reaction. This may be done if you have had a reaction to a transfusion before. This medicine may be given to you by mouth or through an IV tube. °This procedure lasts about 1-4 hours. Plan for the time you need. °What happens during the procedure? ° °An IV tube will be put into one of your veins. °The bag of donated blood will be attached to your IV tube. Then, the blood will enter through your vein. °Your temperature,   blood pressure, and pulse will be checked often. This is done to find early signs of a transfusion reaction. °Tell your nurse right away if you have any of these symptoms: °Shortness of breath or trouble breathing. °Chest or back pain. °Fever or chills. °Red, swollen areas of skin or itching. °If you have any signs or symptoms of a reaction, your  transfusion will be stopped. You may also be given medicine. °When the transfusion is finished, your IV tube will be taken out. °Pressure may be put on the IV site for a few minutes. °A bandage (dressing) will be put on the IV site. °The procedure may vary among doctors and hospitals. °What happens after the procedure? °You will be monitored until you leave the hospital or clinic. This includes checking your temperature, blood pressure, pulse, breathing rate, and blood oxygen level. °Your blood may be tested to see how you are responding to the transfusion. °You may be warmed with fluids or blankets. This is done to keep the temperature of your body normal. °If you have your procedure in an outpatient setting, you will be told whom to contact to report any reactions. °Where to find more information °To learn more, visit the [employer]: redcross.org °Summary °A blood transfusion is a procedure in which you are given blood through an IV tube. °The blood may come from someone else (a donor). You may also be able to donate blood for yourself. °The blood you are given is made up of different blood cells. You may receive red blood cells, platelets, plasma, or white blood cells. °Your temperature, blood pressure, and pulse will be checked often. °After the procedure, your blood may be tested to see how you are responding. °This information is not intended to replace advice given to you by your health care provider. Make sure you discuss any questions you have with your health care provider. °Document Revised: 02/08/2019 Document Reviewed: 02/08/2019 °Elsevier Patient Education © 2022 [employer] ° °

## 2021-04-03 ENCOUNTER — Ambulatory Visit: Payer: Medicare Other

## 2021-04-03 LAB — TYPE AND SCREEN
ABO/RH(D): O POS
Antibody Screen: NEGATIVE
Unit division: 0
Unit division: 0

## 2021-04-03 LAB — BPAM RBC
Blood Product Expiration Date: 202209062359
Blood Product Expiration Date: 202209062359
ISSUE DATE / TIME: 202208040923
ISSUE DATE / TIME: 202208040923
Unit Type and Rh: 5100
Unit Type and Rh: 5100

## 2021-04-06 ENCOUNTER — Telehealth: Payer: Self-pay | Admitting: Internal Medicine

## 2021-04-06 ENCOUNTER — Other Ambulatory Visit: Payer: Medicare Other

## 2021-04-06 ENCOUNTER — Other Ambulatory Visit: Payer: Self-pay | Admitting: Medical Oncology

## 2021-04-06 ENCOUNTER — Telehealth: Payer: Self-pay | Admitting: Medical Oncology

## 2021-04-06 DIAGNOSIS — R112 Nausea with vomiting, unspecified: Secondary | ICD-10-CM

## 2021-04-06 MED ORDER — SODIUM CHLORIDE 0.9 % IV SOLN
INTRAVENOUS | Status: DC
  Filled 2021-04-06: qty 250

## 2021-04-06 MED ORDER — SODIUM CHLORIDE 0.9 % IV SOLN: 8.0000 mg | Freq: Once | INTRAVENOUS | Status: DC | PRN

## 2021-04-06 NOTE — Addendum Note (Signed)
Addended by: Carolynne Edouard B on: 04/06/2021 01:12 PM   Modules accepted: Orders

## 2021-04-06 NOTE — Telephone Encounter (Signed)
Scheduled appts per 8/8 sch msg. Pt aware.

## 2021-04-06 NOTE — Telephone Encounter (Signed)
Nausea/Vomiting all weekend. Alt Zofran , compazine..-q 6 hrs   Emesis - "5x /day, bile ". He is able to eat and drink fluids.    Missed flush/lab appt. today due to same.   He is requesting R/S port flush with labs and fluids/antiemetics tomorrow.   He wants to rest today .  Schedule message sent.

## 2021-04-07 ENCOUNTER — Other Ambulatory Visit: Payer: Self-pay | Admitting: Physician Assistant

## 2021-04-07 ENCOUNTER — Inpatient Hospital Stay: Payer: Medicare Other

## 2021-04-07 ENCOUNTER — Ambulatory Visit (HOSPITAL_BASED_OUTPATIENT_CLINIC_OR_DEPARTMENT_OTHER): Payer: Medicare Other | Admitting: Physician Assistant

## 2021-04-07 ENCOUNTER — Other Ambulatory Visit: Payer: Self-pay

## 2021-04-07 DIAGNOSIS — M109 Gout, unspecified: Secondary | ICD-10-CM

## 2021-04-07 DIAGNOSIS — Z95828 Presence of other vascular implants and grafts: Secondary | ICD-10-CM

## 2021-04-07 DIAGNOSIS — C3491 Malignant neoplasm of unspecified part of right bronchus or lung: Secondary | ICD-10-CM

## 2021-04-07 DIAGNOSIS — Z5111 Encounter for antineoplastic chemotherapy: Secondary | ICD-10-CM | POA: Diagnosis not present

## 2021-04-07 DIAGNOSIS — R112 Nausea with vomiting, unspecified: Secondary | ICD-10-CM

## 2021-04-07 LAB — CMP (CANCER CENTER ONLY)
ALT: 27 U/L (ref 0–44)
AST: 13 U/L — ABNORMAL LOW (ref 15–41)
Albumin: 3 g/dL — ABNORMAL LOW (ref 3.5–5.0)
Alkaline Phosphatase: 122 U/L (ref 38–126)
Anion gap: 11 (ref 5–15)
BUN: 21 mg/dL (ref 8–23)
CO2: 23 mmol/L (ref 22–32)
Calcium: 9.3 mg/dL (ref 8.9–10.3)
Chloride: 104 mmol/L (ref 98–111)
Creatinine: 0.92 mg/dL (ref 0.61–1.24)
GFR, Estimated: >60 mL/min
Glucose, Bld: 96 mg/dL (ref 70–99)
Potassium: 3.8 mmol/L (ref 3.5–5.1)
Sodium: 138 mmol/L (ref 135–145)
Total Bilirubin: 0.7 mg/dL (ref 0.3–1.2)
Total Protein: 6.9 g/dL (ref 6.5–8.1)

## 2021-04-07 LAB — CBC WITH DIFFERENTIAL (CANCER CENTER ONLY)
Abs Immature Granulocytes: 0.02 10*3/uL (ref 0.00–0.07)
Basophils Absolute: 0 10*3/uL (ref 0.0–0.1)
Basophils Relative: 1 %
Eosinophils Absolute: 0 10*3/uL (ref 0.0–0.5)
Eosinophils Relative: 1 %
HCT: 30.2 % — ABNORMAL LOW (ref 39.0–52.0)
Hemoglobin: 10.1 g/dL — ABNORMAL LOW (ref 13.0–17.0)
Immature Granulocytes: 1 %
Lymphocytes Relative: 45 %
Lymphs Abs: 1.5 10*3/uL (ref 0.7–4.0)
MCH: 29.8 pg (ref 26.0–34.0)
MCHC: 33.4 g/dL (ref 30.0–36.0)
MCV: 89.1 fL (ref 80.0–100.0)
Monocytes Absolute: 0.2 10*3/uL (ref 0.1–1.0)
Monocytes Relative: 6 %
Neutro Abs: 1.6 10*3/uL — ABNORMAL LOW (ref 1.7–7.7)
Neutrophils Relative %: 46 %
Platelet Count: 304 10*3/uL (ref 150–400)
RBC: 3.39 MIL/uL — ABNORMAL LOW (ref 4.22–5.81)
RDW: 13.8 % (ref 11.5–15.5)
WBC Count: 3.4 10*3/uL — ABNORMAL LOW (ref 4.0–10.5)
nRBC: 0 % (ref 0.0–0.2)

## 2021-04-07 LAB — MAGNESIUM: Magnesium: 1.5 mg/dL — ABNORMAL LOW (ref 1.7–2.4)

## 2021-04-07 MED ORDER — ONDANSETRON HCL 4 MG/2ML IJ SOLN: 8.0000 mg | Freq: Once | INTRAMUSCULAR | Status: DC | PRN

## 2021-04-07 MED ORDER — NAPROXEN 500 MG PO TABS: ORAL_TABLET | ORAL | 0 refills | Status: DC

## 2021-04-07 MED ORDER — SODIUM CHLORIDE 0.9% FLUSH
10.0000 mL | Freq: Once | INTRAVENOUS | Status: AC
  Administered 2021-04-07: 10 mL
  Filled 2021-04-07: qty 10

## 2021-04-07 MED ORDER — MAGNESIUM SULFATE 2 GM/50ML IV SOLN
2.0000 g | Freq: Once | INTRAVENOUS | Status: AC
  Administered 2021-04-07: 2 g via INTRAVENOUS

## 2021-04-07 MED ORDER — MAGNESIUM OXIDE -MG SUPPLEMENT 400 (240 MG) MG PO TABS: 400.0000 mg | ORAL_TABLET | Freq: Every day | ORAL | 1 refills | Status: DC

## 2021-04-07 MED ORDER — HEPARIN SOD (PORK) LOCK FLUSH 100 UNIT/ML IV SOLN
500.0000 [IU] | Freq: Once | INTRAVENOUS | Status: AC
  Administered 2021-04-07: 500 [IU]
  Filled 2021-04-07: qty 5

## 2021-04-07 MED ORDER — SODIUM CHLORIDE 0.9 % IV SOLN
Freq: Once | INTRAVENOUS | Status: AC
  Filled 2021-04-07: qty 1000

## 2021-04-07 MED ORDER — ONDANSETRON HCL 4 MG/2ML IJ SOLN
INTRAMUSCULAR | Status: AC
  Filled 2021-04-07: qty 4

## 2021-04-07 MED ORDER — MAGNESIUM SULFATE 2 GM/50ML IV SOLN
INTRAVENOUS | Status: AC
  Filled 2021-04-07: qty 50

## 2021-04-07 MED ORDER — ONDANSETRON HCL 4 MG/2ML IJ SOLN
8.0000 mg | Freq: Once | INTRAMUSCULAR | Status: AC
  Administered 2021-04-07: 8 mg via INTRAVENOUS

## 2021-04-07 NOTE — Patient Instructions (Signed)
[facility]  Discharge Instructions: Thank you for choosing [facility] to provide your oncology and hematology care.   If you have a lab appointment with the [facility], please go directly to the [facility] and check in at the registration area.   Wear comfortable clothing and clothing appropriate for easy access to any Portacath or PICC line.   We strive to give you quality time with your provider. You may need to reschedule your appointment if you arrive late (15 or more minutes).  Arriving late affects you and other patients whose appointments are after yours.  Also, if you miss three or more appointments without notifying the office, you may be dismissed from the clinic at the provider's discretion.      For prescription refill requests, have your pharmacy contact our office and allow 72 hours for refills to be completed.    Today you received the following chemotherapy and/or immunotherapy agents IV hydration, Zofran and Magnesium      To help prevent nausea and vomiting after your treatment, we encourage you to take your nausea medication as directed.  BELOW ARE SYMPTOMS THAT SHOULD BE REPORTED IMMEDIATELY: *FEVER GREATER THAN 100.4 F (38 C) OR HIGHER *CHILLS OR SWEATING *NAUSEA AND VOMITING THAT IS NOT CONTROLLED WITH YOUR NAUSEA MEDICATION *UNUSUAL SHORTNESS OF BREATH *UNUSUAL BRUISING OR BLEEDING *URINARY PROBLEMS (pain or burning when urinating, or frequent urination) *BOWEL PROBLEMS (unusual diarrhea, constipation, pain near the anus) TENDERNESS IN MOUTH AND THROAT WITH OR WITHOUT PRESENCE OF ULCERS (sore throat, sores in mouth, or a toothache) UNUSUAL RASH, SWELLING OR PAIN  UNUSUAL VAGINAL DISCHARGE OR ITCHING   Items with * indicate a potential emergency and should be followed up as soon as possible or go to the Emergency Department if any problems should occur.  Please show the CHEMOTHERAPY ALERT CARD or IMMUNOTHERAPY  ALERT CARD at check-in to the Emergency Department and triage nurse.  Should you have questions after your visit or need to cancel or reschedule your appointment, please contact [facility]  Dept: [phone]  and follow the prompts.  Office hours are 8:00 a.m. to 4:30 p.m. Monday - Friday. Please note that voicemails left after 4:00 p.m. may not be returned until the following business day.  We are closed weekends and major holidays. You have access to a nurse at all times for urgent questions. Please call the main number to the clinic Dept: [phone] and follow the prompts.   For any non-urgent questions, you may also contact your provider using MyChart. We now offer e-Visits for anyone 40 and older to request care online for non-urgent symptoms. For details visit [email or web address].   Also download the MyChart app! Go to the app store, search "MyChart", open the app, select [facility], and log in with your MyChart username and password.  Due to Covid, a mask is required upon entering the hospital/clinic. If you do not have a mask, one will be given to you upon arrival. For doctor visits, patients may have 1 support person aged 21 or older with them. For treatment visits, patients cannot have anyone with them due to current Covid guidelines and our immunocompromised population.

## 2021-04-08 ENCOUNTER — Encounter: Payer: Self-pay | Admitting: Internal Medicine

## 2021-04-08 ENCOUNTER — Encounter: Payer: Self-pay | Admitting: Physician Assistant

## 2021-04-08 NOTE — Progress Notes (Signed)
[facility] Symptom management Note  [name], MD [place] [address]  DIAGNOSIS: Stage IV (pT1c, N1, M1b) large cell neuroendocrine carcinoma.  He presented with a right upper lobe lung nodule and hilar lymphadenopathy.  He was diagnosed in April 2022. He also had a small metastatic brain lesion.    PDL1: 0%  PRIOR THERAPY: Robotic assisted thoracic surgery with a right upper lobectomy which was performed on 12/24/2020.    CURRENT THERAPY:Adjuvant chemotherapy with cisplatin 80 mg/m2 on day 1, etoposide 100 mg per metered squared on days 1, 2, and 3, IV every 3 weeks. First dose on 02/09/21.  Status post 3 cycles.  INTERVAL HISTORY: [name] 65 y.o. male returns to the clinic today for an acute visit.  The patient underwent cycle #3 of chemotherapy last week on 03/30/21.  In the interval since his last appointment, the patient developed some tachycardia, dehydration, and nausea/vomiting.  Therefore, he was brought to the clinic today for repeat blood work and antiemetics and IV fluids.  He reportedly had emesis approximately 5 times a day and over the weekend.  He has Compazine and Zofran at home for which he is prescribed to take this every 6 and 8 hours.  The patient has been having new onset left joint pain and swelling in the left great toe.  He states that it was so painful last night that even the covers touching his toe caused significant pain.  He does have a history of gout but states that this is more swollen than prior.  He had similar symptoms while in the hospital from 7/24 to 03/26/2021 in which his uric acid levels were checked which were normal.  He also had an x-ray which did not show any abnormalities.  However, the pain got more significant last night.  There is no open wound, no drainage, no warmth.  He denies any recent trauma or injury.  He denies any systemic symptoms such as fevers, chills, or night sweats.  The patient was  given indomethacin in the past which is listed on his allergy list due to "rectal bleeding".  The patient does not recall this.  The patient also was given colchicine in the past but this presently has significant drug drug interactions with his amiodarone and statin.  The patient denies any calf pain or swelling.     MEDICAL HISTORY: Past Medical History:  Diagnosis Date   Anxiety    Arthritis    Basal cell carcinoma (BCC) of forehead    CAD (coronary artery disease)    a. 10/2015 ant STEMI >> LHC with 3 v CAD; oLAD tx with POBA >> emergent CABG. b. Multiple evals since that time, early graft failure of SVG-RCA by cath 03/2016. c. 2/19 PCI/DES x1 to pRCA, normal EF.   Carotid artery disease ([place])    a. 40-59% BICA 02/2018.   Depression    Dyspnea    Ectopic atrial tachycardia (HCC)    Esophageal reflux    eosinophil esophagitis   Family history of adverse reaction to anesthesia    "sister has PONV" (06/21/2017)   Former tobacco use    Gout    Hepatitis C    "treated and cured" (06/21/2017)   High cholesterol    History of kidney stones    Hypertension    Ischemic cardiomyopathy    a. EF 25-30% at intraop TEE 4/17  //  b. Limited Echo 5/17 - EF 45-50%,  mild ant HK. c. EF 55-65% by cath 09/2017.   Migraine    "3-4/yr" (06/21/2017)   Myocardial infarction ([place]) 10/2015   Palpitations    Sinus bradycardia    a. HR dropping into 40s in 02/2016 -> BB reduced.   Stroke [facility]) 10/2016   "small one; sometimes my memory/cognitive issues" (06/21/2017)   Symptomatic hypotension    a. 02/2016 ER visit -> meds reduced.   Syncope    Wears dentures    Wears glasses     ALLERGIES:  is allergic to prednisone, tetanus toxoids, wellbutrin [bupropion], indomethacin, other, and varenicline.  MEDICATIONS:  Current Outpatient Medications  Medication Sig Dispense Refill   acetaminophen (TYLENOL) 500 MG tablet Take 1,000 mg by mouth every 6 (six) hours as needed for moderate pain.     albuterol  (VENTOLIN HFA) 108 (90 Base) MCG/ACT inhaler Inhale 2 puffs into the lungs every 6 (six) hours as needed for wheezing or shortness of breath. 8 g 2   amiodarone (PACERONE) 200 MG tablet Take 1 tablet (200 mg total) by mouth daily. 30 tablet 1   amLODipine (NORVASC) 10 MG tablet TAKE 1 TABLET(10 MG) BY MOUTH DAILY (Patient taking differently: Take 10 mg by mouth daily.) 90 tablet 1   aspirin EC 81 MG tablet Take 81 mg by mouth daily. Swallow whole.     atorvastatin (LIPITOR) 80 MG tablet TAKE 1 TABLET(80 MG) BY MOUTH DAILY 90 tablet 2   cloNIDine (CATAPRES) 0.1 MG tablet Take 0.1 mg by mouth daily as needed (sleep).     clopidogrel (PLAVIX) 75 MG tablet TAKE 1 TABLET BY MOUTH EVERY DAY (Patient taking differently: Take 75 mg by mouth daily.) 90 tablet 1   Diphenhyd-Hydrocort-Nystatin (FIRST-DUKES MOUTHWASH MT) Use as directed 5 mLs in the mouth or throat 4 (four) times daily -  before meals and at bedtime.     HYDROcodone bit-homatropine (HYCODAN) 5-1.5 MG/5ML syrup Take 5 mLs by mouth every 6 (six) hours as needed for cough. 120 mL 0   lidocaine-prilocaine (EMLA) cream Apply 1 application topically once as needed (port access).     magnesium oxide (MAG-OX) 400 (240 Mg) MG tablet Take 1 tablet (400 mg total) by mouth daily. 30 tablet 1   metoprolol succinate (TOPROL-XL) 25 MG 24 hr tablet Take 1 tablet (25 mg total) by mouth daily. 90 tablet 3   naproxen (NAPROSYN) 500 MG tablet Take 1 tablet twice a day, followed by 1 tablet daily as symptoms improve. Discontinue with resolution of symptoms 20 tablet 0   nitroGLYCERIN (NITROSTAT) 0.4 MG SL tablet PLACE 1 TABLET UNDER THE TONGUE EVERY 5 MINUTES AS NEEDED FOR CHEST PAIN. 3 DOSES MAX 25 tablet 4   ondansetron (ZOFRAN) 8 MG tablet Take 1 tablet (8 mg total) by mouth every 8 (eight) hours as needed for nausea or vomiting. Starting 3 days after chemotherapy 30 tablet 2   oxyCODONE (OXY IR/ROXICODONE) 5 MG immediate release tablet Take 1 tablet (5 mg total)  by mouth every 6 (six) hours as needed for moderate pain. 30 tablet 0   prochlorperazine (COMPAZINE) 10 MG tablet Take 1 tablet (10 mg total) by mouth every 6 (six) hours as needed. 30 tablet 2   ranolazine (RANEXA) 1000 MG SR tablet Take 1 tablet (1,000 mg total) by mouth 2 (two) times daily. (Patient taking differently: Take 1,000 mg by mouth daily.) 180 tablet 0   Tiotropium Bromide-Olodaterol (STIOLTO RESPIMAT) 2.5-2.5 MCG/ACT AERS Inhale 2 puffs into the lungs daily. 4 g  3   No current facility-administered medications for this visit.   Facility-Administered Medications Ordered in Other Visits  Medication Dose Route Frequency Provider Last Rate Last Admin   0.9 %  sodium chloride infusion   Intravenous Continuous Curt Bears, MD   Stopped at 03/30/21 1145   0.9 % NaCl with KCl 20 mEq/ L  infusion   Intravenous Continuous Curt Bears, MD   Stopped at 03/30/21 1722   sodium chloride flush (NS) 0.9 % injection 10 mL  10 mL Intracatheter PRN Curt Bears, MD   10 mL at 03/30/21 1722    SURGICAL HISTORY:  Past Surgical History:  Procedure Laterality Date   25 GAUGE PARS PLANA VITRECTOMY WITH 20 GAUGE MVR PORT  12/31/2020   Procedure: 25 GAUGE PARS PLANA VITRECTOMY WITH 20 GAUGE MVR PORT;  Surgeon: [name], MD;  Location: [place];  Service: Thoracic;;   ANTERIOR CERVICAL DECOMP/DISCECTOMY FUSION N/A 10/17/2018   Procedure: Anterior Cervical Decompression Fusion - Cervical seven -Thoracic one;  Surgeon: Consuella Lose, MD;  Location: [place];  Service: Neurosurgery;  Laterality: N/A;   BASAL CELL CARCINOMA EXCISION     "forehead   BIOPSY  07/20/2019   Procedure: BIOPSY;  Surgeon: Carol Ada, MD;  Location: [facility];  Service: Endoscopy;;   BRONCHIAL BIOPSY  12/24/2020   Procedure: BRONCHIAL BIOPSIES;  Surgeon: Garner Nash, DO;  Location: [facility];  Service: Pulmonary;;   BRONCHIAL BRUSHINGS  12/24/2020   Procedure: BRONCHIAL BRUSHINGS;  Surgeon: Garner Nash, DO;  Location: [facility];  Service: Pulmonary;;   BRONCHIAL NEEDLE ASPIRATION BIOPSY  12/24/2020   Procedure: BRONCHIAL NEEDLE ASPIRATION BIOPSIES;  Surgeon: Garner Nash, DO;  Location: [facility];  Service: Pulmonary;;   CARDIAC CATHETERIZATION N/A 11/28/2015   Procedure: Left Heart Cath and Coronary Angiography;  Surgeon: [name], MD;  Location: [facility];  Service: Cardiovascular;  Laterality: N/A;   CARDIAC CATHETERIZATION N/A 11/28/2015   Procedure: Coronary Balloon Angioplasty;  Surgeon: [name], MD;  Location: [facility];  Service: Cardiovascular;  Laterality: N/A;  ostial LAD   CARDIAC CATHETERIZATION N/A 11/28/2015   Procedure: Coronary/Graft Angiography;  Surgeon: [name], MD;  Location: [place] [facility];  Service: Cardiovascular;  Laterality: N/A;  coronaries only    CARDIAC CATHETERIZATION N/A 04/21/2016   Procedure: Left Heart Cath and Coronary Angiography;  Surgeon: Wellington Hampshire, MD;  Location: [facility] [facility];  Service: Cardiovascular;  Laterality: N/A;   CARDIAC CATHETERIZATION N/A 06/14/2016   Procedure: Left Heart Cath and Cors/Grafts Angiography;  Surgeon: Lorretta Harp, MD;  Location: [facility];  Service: Cardiovascular;  Laterality: N/A;   CARDIAC CATHETERIZATION N/A 09/08/2016   Procedure: Left Heart Cath and Cors/Grafts Angiography;  Surgeon: Wellington Hampshire, MD;  Location: [facility];  Service: Cardiovascular;  Laterality: N/A;   CARDIAC CATHETERIZATION     CORONARY ARTERY BYPASS GRAFT N/A 11/28/2015   Procedure: CORONARY ARTERY BYPASS GRAFTING (CABG) TIMES FIVE USING LEFT INTERNAL MAMMARY ARTERY AND RIGHT GREATER SAPHENOUS,VIEN HARVEATED BY ENDOVIEN, INTRAOPPRATIVE TEE;  Surgeon: Gaye Pollack, MD;  Location: [place];  Service: Open Heart Surgery;  Laterality: N/A;   CORONARY STENT INTERVENTION N/A 10/05/2017   Procedure: CORONARY STENT INTERVENTION;  Surgeon: [name], MD;  Location: [place] [facility];  Service: Cardiovascular;  Laterality: N/A;   ESOPHAGOGASTRODUODENOSCOPY (EGD) WITH PROPOFOL N/A 07/20/2019   Procedure: ESOPHAGOGASTRODUODENOSCOPY (EGD) WITH PROPOFOL;  Surgeon:  Carol Ada, MD;  Location: [facility];  Service: Endoscopy;  Laterality: N/A;   ESOPHAGOGASTRODUODENOSCOPY (EGD) WITH PROPOFOL N/A 01/30/2021   Procedure: ESOPHAGOGASTRODUODENOSCOPY (EGD) WITH PROPOFOL;  Surgeon: Carol Ada, MD;  [facility]: [facility];  Service: Endoscopy;  Laterality: N/A;   FIDUCIAL MARKER PLACEMENT  12/24/2020   Procedure: FIDUCIAL DYE MARKER PLACEMENT;  Surgeon: Garner Nash, DO;  Location: [facility];  Service: Pulmonary;;   HUMERUS SURGERY Right 1969   "tumor inside bone; filled it w/bone chips"   INTERCOSTAL NERVE BLOCK Right 12/24/2020   Procedure: INTERCOSTAL NERVE BLOCK;  Surgeon: [name], MD;  Location: [facility];  Service: Thoracic;  Laterality: Right;   IR IMAGING GUIDED PORT INSERTION  02/06/2021   LEFT HEART CATH AND CORS/GRAFTS ANGIOGRAPHY N/A 03/11/2017   Procedure: Left Heart Cath and Cors/Grafts Angiography;  Surgeon: Leonie Man, MD;  Location: [place] [facility];  Service: Cardiovascular;  Laterality: N/A;   LEFT HEART CATH AND CORS/GRAFTS ANGIOGRAPHY N/A 10/05/2017   Procedure: LEFT HEART CATH AND CORS/GRAFTS ANGIOGRAPHY;  Surgeon: [name], MD;  Location: [place] [facility];  Service: Cardiovascular;  Laterality: N/A;   LEFT HEART CATH AND CORS/GRAFTS ANGIOGRAPHY N/A 04/11/2019   Procedure: LEFT HEART CATH AND CORS/GRAFTS ANGIOGRAPHY;  Surgeon: [name], MD;  Location: [facility];  Service: Cardiovascular;  Laterality: N/A;   NODE DISSECTION Right 12/24/2020   Procedure: NODE DISSECTION;  Surgeon: [name], MD;  Location: [place];  Service: Thoracic;  Laterality: Right;   PERIPHERAL VASCULAR CATHETERIZATION N/A 06/14/2016   Procedure: Lower Extremity Angiography;  Surgeon: Lorretta Harp, MD;  Location: [facility];  Service: Cardiovascular;  Laterality: N/A;   VIDEO BRONCHOSCOPY WITH ENDOBRONCHIAL NAVIGATION Right 12/24/2020   Procedure: VIDEO BRONCHOSCOPY WITH ENDOBRONCHIAL NAVIGATION;  Surgeon: Garner Nash, DO;  Location: [facility];  Service: Pulmonary;  Laterality: Right;   VIDEO BRONCHOSCOPY WITH ENDOBRONCHIAL ULTRASOUND N/A 12/24/2020   Procedure: VIDEO BRONCHOSCOPY WITH ENDOBRONCHIAL ULTRASOUND;  Surgeon: Garner Nash, DO;  Location: [facility];  Service: Pulmonary;  Laterality: N/A;   VIDEO BRONCHOSCOPY WITH INSERTION OF INTERBRONCHIAL VALVE (IBV) N/A 12/31/2020   Procedure: VIDEO BRONCHOSCOPY WITH INSERTION OF INTERBRONCHIAL VALVE (IBV).VALVE IN CARTRIDGE 66m,9mm. CHEST TUBE PLACEMENT.;  Surgeon: LLajuana Matte MD;  Location: [facility];  Service: Thoracic;  Laterality: N/A;    REVIEW OF SYSTEMS:   Review of Systems  Constitutional: Negative for appetite change, chills, fatigue, fever and unexpected weight change.  HENT:   Negative for mouth sores, nosebleeds, sore throat and trouble swallowing.   Eyes: Negative for eye problems and icterus.  Respiratory: Negative for cough, hemoptysis, shortness of breath and wheezing.   Cardiovascular: Negative for chest pain and leg swelling.  Gastrointestinal: Negative for abdominal pain, constipation, diarrhea, nausea and vomiting.  Genitourinary: Negative for bladder incontinence, difficulty urinating, dysuria, frequency and hematuria.   Musculoskeletal: Negative for back pain, gait problem, neck pain and neck stiffness.  Skin: Negative for itching and rash.  Neurological: Negative for dizziness, extremity weakness, gait problem, headaches, light-headedness and seizures.  Hematological: Negative for adenopathy. Does not bruise/bleed easily.  Psychiatric/Behavioral: Negative for confusion, depression and sleep disturbance. The patient is not nervous/anxious.     PHYSICAL EXAMINATION:  There were no  vitals taken for this visit.  ECOG PERFORMANCE STATUS: 1  Physical Exam  Constitutional: Oriented to person, place, and time and well-developed, well-nourished, and in no distress. HENT:  Head: Normocephalic and atraumatic.  Eyes: Conjunctivae  are normal. Right eye exhibits no discharge. Left eye exhibits no discharge. No scleral icterus.  Neck: Normal range of motion. Neck supple.  Musculoskeletal: Significant tenderness to palpation, erythema, and swelling of the left great toe.  No warmth.  No signs of injury.  No other areas of tenderness.  No ankle or calf pain/swelling.  No significant tenderness to palpation or overlying skin changes on the right foot.  Normal range of motion. Exhibits no edema.  Lymphadenopathy:    No cervical adenopathy.  Neurological: Alert and oriented to person, place, and time. Exhibits normal muscle tone. Gait normal. Coordination normal.  Skin: Skin is warm and dry. No rash noted. Not diaphoretic. No pallor.  Psychiatric: Mood, memory and judgment normal.  Vitals reviewed.  LABORATORY DATA: Lab Results  Component Value Date   WBC 3.4 (L) 04/07/2021   HGB 10.1 (L) 04/07/2021   HCT 30.2 (L) 04/07/2021   MCV 89.1 04/07/2021   PLT 304 04/07/2021      Chemistry      Component Value Date/Time   NA 138 04/07/2021 1409   K 3.8 04/07/2021 1409   CL 104 04/07/2021 1409   CO2 23 04/07/2021 1409   BUN 21 04/07/2021 1409   CREATININE 0.92 04/07/2021 1409   CREATININE 1.00 02/12/2016 1038      Component Value Date/Time   CALCIUM 9.3 04/07/2021 1409   ALKPHOS 122 04/07/2021 1409   AST 13 (L) 04/07/2021 1409   ALT 27 04/07/2021 1409   BILITOT 0.7 04/07/2021 1409       RADIOGRAPHIC STUDIES:  DG Chest Port 1 View  Result Date: 03/22/2021 CLINICAL DATA:  Fever and chills starting last night. Ongoing chemotherapy for lung cancer. EXAM: PORTABLE CHEST 1 VIEW COMPARISON:  02/28/2021 FINDINGS: Fall injectable right Port-A-Cath tip: Right atrium. Prior  CABG. Prior right upper lobectomy with expected volume loss in the right hemithorax. Lower cervical plate and screw fixator. Borderline enlargement of the cardiopericardial silhouette, without edema. There is some mild blunting of the right lateral costophrenic angle along with some progressive density at the right lung base compared to 02/28/2021; some of this may be from the patient's known pleural effusion but a component of early pneumonia is difficult to exclude given the patient's symptoms. IMPRESSION: 1. Increased density at the right lung base. A component of this may be due to the patient's small right pleural effusion, but early pneumonia at the right lung base is difficult to exclude given the change from 02/28/2021. 2. Prior right upper lobectomy. 3. Borderline enlargement of the cardiopericardial silhouette, without edema. 4. Electronically Signed   By: Van Clines M.D.   On: 03/22/2021 12:17   DG Foot Complete Left  Result Date: 03/26/2021 CLINICAL DATA:  Left foot pain. EXAM: LEFT FOOT - COMPLETE 3+ VIEW COMPARISON:  08/28/2008. FINDINGS: No acute bony or joint abnormality identified. No evidence of fracture dislocation. No radiopaque foreign body. IMPRESSION: No acute abnormality. Electronically Signed   By: Marcello Moores  Register   On: 03/26/2021 10:45     ASSESSMENT/PLAN:  This is a very pleasant 65 years old white male recently diagnosed with a stage IV (T1c, N1, M1 B) large cell neuroendocrine carcinoma presented with right upper lobe lung nodule in addition to right hilar adenopathy in April 2022 and the patient was found to have solitary brain metastasis in May 2022 He is status post right upper lobectomy with lymph node sampling under the care of Dr. Kipp Brood. The patient is currently undergoing systemic chemotherapy with  cisplatin 80 mg/M2 on day 1 and etoposide 100 Mg/M2 on days 1, 2 and 3 every 3 weeks.  He is status post 3 cycles.    Has been having some nausea and vomiting.   He will receive 1 L of fluid while in the clinic today.  He also has hypomagnesemia with a magnesium level of 1.5.  He states that he has not been taking his magnesium supplement over-the-counter and requires a refill.  I have refilled this.  Additionally, we will add 2 g of magnesium sulfate to his IV fluids today.  The patient's physical exam findings are most consistent with gout given the localized area of exquisite tenderness to palpation, swelling, and erythema over the left great toe joint.  Patient does not have any other systemic symptoms.  Unable to give the patient colchicine due to the significant drug drug interactions with his amiodarone and statin.  The patient's kidney function is within normal limits today.  His allergy list said that he has had rectal bleeding in the past indomethacin.  The patient does not recall this.  I had a lengthy discussion with the patient today and discussed that I will prescribe naproxen 500 mg to take twice a day for the first day and then to reduce to 1 tablet a day until resolution of his symptoms. He was advised to call me if he develops any abnormal bleeding but this will be a short-term prescription.  He was encouraged to drink plenty of water.   He was also encouraged to call me if he develops any new or worsening symptoms such as systemic symptoms such as fever, chills, or night sweats.  He also was advised to call me if he develops any lower extremity swelling, worsening erythema, warmth, or skin wounds.  We will see him back for follow-up visit in 2 weeks before starting cycle #4.  The patient was advised to call immediately if he has any concerning symptoms in the interval. The patient voices understanding of current disease status and treatment options and is in agreement with the current care plan. All questions were answered. The patient knows to call the clinic with any problems, questions or concerns. We can certainly see the patient much  sooner if necessary      No orders of the defined types were placed in this encounter.    The total time spent in the appointment was 20-29 minutes.   Pavlos Yon L Bhavika Schnider, PA-C 04/08/21

## 2021-04-09 ENCOUNTER — Other Ambulatory Visit: Payer: Self-pay | Admitting: Radiation Therapy

## 2021-04-09 ENCOUNTER — Telehealth: Payer: Self-pay | Admitting: Medical Oncology

## 2021-04-09 DIAGNOSIS — C7931 Secondary malignant neoplasm of brain: Secondary | ICD-10-CM

## 2021-04-09 NOTE — Progress Notes (Signed)
Orders placed for port access the day of brain MRI at [facility].   Mont Dutton R.T.(R)(T) Radiation Special Procedures Navigator

## 2021-04-09 NOTE — Telephone Encounter (Signed)
Pt called asking  "When is my next MRI?" Message sent to [facility].

## 2021-04-09 NOTE — Telephone Encounter (Signed)
[name]- can you look at his appts

## 2021-04-13 ENCOUNTER — Inpatient Hospital Stay: Payer: Medicare Other

## 2021-04-13 ENCOUNTER — Other Ambulatory Visit: Payer: Medicare Other

## 2021-04-13 ENCOUNTER — Other Ambulatory Visit: Payer: Self-pay

## 2021-04-13 DIAGNOSIS — C3491 Malignant neoplasm of unspecified part of right bronchus or lung: Secondary | ICD-10-CM

## 2021-04-13 DIAGNOSIS — Z5111 Encounter for antineoplastic chemotherapy: Secondary | ICD-10-CM | POA: Diagnosis not present

## 2021-04-13 LAB — CBC WITH DIFFERENTIAL (CANCER CENTER ONLY)
Abs Immature Granulocytes: 2.12 10*3/uL — ABNORMAL HIGH (ref 0.00–0.07)
Basophils Absolute: 0 10*3/uL (ref 0.0–0.1)
Basophils Relative: 0 %
Eosinophils Absolute: 0.1 10*3/uL (ref 0.0–0.5)
Eosinophils Relative: 1 %
HCT: 31.2 % — ABNORMAL LOW (ref 39.0–52.0)
Hemoglobin: 10.4 g/dL — ABNORMAL LOW (ref 13.0–17.0)
Immature Granulocytes: 12 %
Lymphocytes Relative: 16 %
Lymphs Abs: 2.7 10*3/uL (ref 0.7–4.0)
MCH: 29.5 pg (ref 26.0–34.0)
MCHC: 33.3 g/dL (ref 30.0–36.0)
MCV: 88.6 fL (ref 80.0–100.0)
Monocytes Absolute: 1.5 10*3/uL — ABNORMAL HIGH (ref 0.1–1.0)
Monocytes Relative: 9 %
Neutro Abs: 10.7 10*3/uL — ABNORMAL HIGH (ref 1.7–7.7)
Neutrophils Relative %: 62 %
Platelet Count: 164 10*3/uL (ref 150–400)
RBC: 3.52 MIL/uL — ABNORMAL LOW (ref 4.22–5.81)
RDW: 14.8 % (ref 11.5–15.5)
WBC Count: 17.1 10*3/uL — ABNORMAL HIGH (ref 4.0–10.5)
nRBC: 0.2 % (ref 0.0–0.2)

## 2021-04-13 LAB — CMP (CANCER CENTER ONLY)
ALT: 18 U/L (ref 0–44)
AST: 16 U/L (ref 15–41)
Albumin: 3.3 g/dL — ABNORMAL LOW (ref 3.5–5.0)
Alkaline Phosphatase: 132 U/L — ABNORMAL HIGH (ref 38–126)
Anion gap: 12 (ref 5–15)
BUN: 17 mg/dL (ref 8–23)
CO2: 24 mmol/L (ref 22–32)
Calcium: 9.5 mg/dL (ref 8.9–10.3)
Chloride: 104 mmol/L (ref 98–111)
Creatinine: 0.97 mg/dL (ref 0.61–1.24)
GFR, Estimated: >60 mL/min (ref >60)
Glucose, Bld: 96 mg/dL (ref 70–99)
Potassium: 4.5 mmol/L (ref 3.5–5.1)
Sodium: 140 mmol/L (ref 135–145)
Total Bilirubin: 0.2 mg/dL — ABNORMAL LOW (ref 0.3–1.2)
Total Protein: 7.2 g/dL (ref 6.5–8.1)

## 2021-04-13 LAB — MAGNESIUM: Magnesium: 1.7 mg/dL (ref 1.7–2.4)

## 2021-04-13 NOTE — Patient Instructions (Signed)
Implanted Port Home Guide An implanted port is a device that is placed under the skin. It is usually placed in the chest. The device can be used to give IV medicine, to take blood, or for dialysis. You may have an implanted port if: You need IV medicine that would be irritating to the small veins in your hands or arms. You need IV medicines, such as antibiotics, for a long period of time. You need IV nutrition for a long period of time. You need dialysis. When you have a port, your health care provider can choose to use the port instead of veins in your arms for these procedures. You may have fewer limitations when using a port than you would if you used other types of long-term IVs, and you will likely be able to return to normal activities afteryour incision heals. An implanted port has two main parts: Reservoir. The reservoir is the part where a needle is inserted to give medicines or draw blood. The reservoir is round. After it is placed, it appears as a small, raised area under your skin. Catheter. The catheter is a thin, flexible tube that connects the reservoir to a vein. Medicine that is inserted into the reservoir goes into the catheter and then into the vein. How is my port accessed? To access your port: A numbing cream may be placed on the skin over the port site. Your health care provider will put on a mask and sterile gloves. The skin over your port will be cleaned carefully with a germ-killing soap and allowed to dry. Your health care provider will gently pinch the port and insert a needle into it. Your health care provider will check for a blood return to make sure the port is in the vein and is not clogged. If your port needs to remain accessed to get medicine continuously (constant infusion), your health care provider will place a clear bandage (dressing) over the needle site. The dressing and needle will need to be changed every week, or as told by your health care provider. What  is flushing? Flushing helps keep the port from getting clogged. Follow instructions from your health care provider about how and when to flush the port. Ports are usually flushed with saline solution or a medicine called heparin. The need for flushing will depend on how the port is used: If the port is only used from time to time to give medicines or draw blood, the port may need to be flushed: Before and after medicines have been given. Before and after blood has been drawn. As part of routine maintenance. Flushing may be recommended every 4-6 weeks. If a constant infusion is running, the port may not need to be flushed. Throw away any syringes in a disposal container that is meant for sharp items (sharps container). You can buy a sharps container from a pharmacy, or you can make one by using an empty hard plastic bottle with a cover. How long will my port stay implanted? The port can stay in for as long as your health care provider thinks it is needed. When it is time for the port to come out, a surgery will be done to remove it. The surgery will be similar to the procedure that was done to putthe port in. Follow these instructions at home:  Flush your port as told by your health care provider. If you need an infusion over several days, follow instructions from your health care provider about how to take   care of your port site. Make sure you: Wash your hands with soap and water before you change your dressing. If soap and water are not available, use alcohol-based hand sanitizer. Change your dressing as told by your health care provider. Place any used dressings or infusion bags into a plastic bag. Throw that bag in the trash. Keep the dressing that covers the needle clean and dry. Do not get it wet. Do not use scissors or sharp objects near the tube. Keep the tube clamped, unless it is being used. Check your port site every day for signs of infection. Check for: Redness, swelling, or  pain. Fluid or blood. Pus or a bad smell. Protect the skin around the port site. Avoid wearing bra straps that rub or irritate the site. Protect the skin around your port from seat belts. Place a soft pad over your chest if needed. Bathe or shower as told by your health care provider. The site may get wet as long as you are not actively receiving an infusion. Return to your normal activities as told by your health care provider. Ask your health care provider what activities are safe for you. Carry a medical alert card or wear a medical alert bracelet at all times. This will let health care providers know that you have an implanted port in case of an emergency. Get help right away if: You have redness, swelling, or pain at the port site. You have fluid or blood coming from your port site. You have pus or a bad smell coming from the port site. You have a fever. Summary Implanted ports are usually placed in the chest for long-term IV access. Follow instructions from your health care provider about flushing the port and changing bandages (dressings). Take care of the area around your port by avoiding clothing that puts pressure on the area, and by watching for signs of infection. Protect the skin around your port from seat belts. Place a soft pad over your chest if needed. Get help right away if you have a fever or you have redness, swelling, pain, drainage, or a bad smell at the port site. This information is not intended to replace advice given to you by your health care provider. Make sure you discuss any questions you have with your healthcare provider. Document Revised: 12/31/2019 Document Reviewed: 12/31/2019 Elsevier Patient Education  2022 [employer]  

## 2021-04-14 ENCOUNTER — Ambulatory Visit: Payer: Medicare Other | Admitting: Primary Care

## 2021-04-14 ENCOUNTER — Telehealth: Payer: Self-pay | Admitting: Hematology

## 2021-04-14 ENCOUNTER — Other Ambulatory Visit: Payer: Self-pay | Admitting: Hematology

## 2021-04-14 MED ORDER — OXYCODONE HCL 5 MG PO TABS: 5.0000 mg | ORAL_TABLET | Freq: Four times a day (QID) | ORAL | 0 refills | Status: DC | PRN

## 2021-04-14 NOTE — Telephone Encounter (Signed)
Pt called after hours to complain of severe b/l leg pain since 3 days ago, no significant leg edema, he is able to walk, no other new symptoms. He is not able to sleep well at night due to his pain. He has called Dr. Worthy Flank office multiple times yesterday and last night but has not received a call back yet. I called in oxycodone for #15 tab, and will inform Dr. [name] to see if he needs to be seen in office tomorrow. All questions were answered, pt agrees with the plan.  [name]  04/14/2021

## 2021-04-17 ENCOUNTER — Telehealth (INDEPENDENT_AMBULATORY_CARE_PROVIDER_SITE_OTHER): Payer: Medicare Other | Admitting: Thoracic Surgery (Cardiothoracic Vascular Surgery)

## 2021-04-17 ENCOUNTER — Other Ambulatory Visit: Payer: Self-pay

## 2021-04-17 ENCOUNTER — Telehealth: Payer: Self-pay

## 2021-04-17 DIAGNOSIS — Z902 Acquired absence of lung [part of]: Secondary | ICD-10-CM

## 2021-04-17 NOTE — Telephone Encounter (Signed)
Pt [name] stating he has an appt on Monday but wanted to "give a heads up" before the appt that he has left hand pain/discomfort. He also states he has ongoing nausea and weight loss.  I have called the pt back and as we discussed his nausea, we determined he is not taking his nausea medicine well. Pt has been advised, given his current feeling, he needs to take the Zofran q8h whether is is nausea in the moment or not. Pt expressed understanding of this information. Pt is okay with discussing his concerns in greater detail with Dr. [name] on Monday 04/20/21.

## 2021-04-17 NOTE — Progress Notes (Signed)
     [address]       [place] [address]             [phone]       Patient: Home Provider: Office Consent for Telemedicine visit obtained.  Today's visit was completed via a real-time telehealth (see specific modality noted below). The patient/authorized person provided oral consent at the time of the visit to engage in a telemedicine encounter with the present provider at [facility]. The patient/authorized person was informed of the potential benefits, limitations, and risks of telemedicine. The patient/authorized person expressed understanding that the laws that protect confidentiality also apply to telemedicine. The patient/authorized person acknowledged understanding that telemedicine does not provide emergency services and that he or she would need to call 911 or proceed to the nearest hospital for help if such a need arose.   Total time spent in the clinical discussion 10 minutes.  Telehealth Modality: Phone visit (audio only)  I had a telephone visit with [name].  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  His hospitalization was complicated by prolonged air leak which required endobronchial valves.  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  He thinks he only has 1 more dose of either chemotherapy.  I will touch base with him again virtually in 47month to see where he is and is adjuvant therapy and hopefully we should be able to schedule endobronchial valve removal.  [name]

## 2021-04-20 ENCOUNTER — Other Ambulatory Visit: Payer: Self-pay | Admitting: Internal Medicine

## 2021-04-20 ENCOUNTER — Inpatient Hospital Stay (HOSPITAL_BASED_OUTPATIENT_CLINIC_OR_DEPARTMENT_OTHER): Payer: Medicare Other | Admitting: Internal Medicine

## 2021-04-20 ENCOUNTER — Inpatient Hospital Stay: Payer: Medicare Other

## 2021-04-20 ENCOUNTER — Other Ambulatory Visit: Payer: Self-pay

## 2021-04-20 VITALS — BP 102/71 | HR 87 | Temp 97.2°F | Resp 20 | Ht 69.0 in | Wt 172.5 lb

## 2021-04-20 DIAGNOSIS — C3491 Malignant neoplasm of unspecified part of right bronchus or lung: Secondary | ICD-10-CM

## 2021-04-20 DIAGNOSIS — Z5111 Encounter for antineoplastic chemotherapy: Secondary | ICD-10-CM | POA: Diagnosis not present

## 2021-04-20 DIAGNOSIS — C7A1 Malignant poorly differentiated neuroendocrine tumors: Secondary | ICD-10-CM

## 2021-04-20 DIAGNOSIS — C3490 Malignant neoplasm of unspecified part of unspecified bronchus or lung: Secondary | ICD-10-CM

## 2021-04-20 DIAGNOSIS — Z95828 Presence of other vascular implants and grafts: Secondary | ICD-10-CM

## 2021-04-20 LAB — MAGNESIUM: Magnesium: 1.9 mg/dL (ref 1.7–2.4)

## 2021-04-20 LAB — CBC WITH DIFFERENTIAL (CANCER CENTER ONLY)
Abs Immature Granulocytes: 0.27 10*3/uL — ABNORMAL HIGH (ref 0.00–0.07)
Basophils Absolute: 0.1 10*3/uL (ref 0.0–0.1)
Basophils Relative: 1 %
Eosinophils Absolute: 0 10*3/uL (ref 0.0–0.5)
Eosinophils Relative: 0 %
HCT: 30.3 % — ABNORMAL LOW (ref 39.0–52.0)
Hemoglobin: 10 g/dL — ABNORMAL LOW (ref 13.0–17.0)
Immature Granulocytes: 2 %
Lymphocytes Relative: 15 %
Lymphs Abs: 2.2 10*3/uL (ref 0.7–4.0)
MCH: 29.6 pg (ref 26.0–34.0)
MCHC: 33 g/dL (ref 30.0–36.0)
MCV: 89.6 fL (ref 80.0–100.0)
Monocytes Absolute: 1.1 10*3/uL — ABNORMAL HIGH (ref 0.1–1.0)
Monocytes Relative: 8 %
Neutro Abs: 11.3 10*3/uL — ABNORMAL HIGH (ref 1.7–7.7)
Neutrophils Relative %: 74 %
Platelet Count: 347 10*3/uL (ref 150–400)
RBC: 3.38 MIL/uL — ABNORMAL LOW (ref 4.22–5.81)
RDW: 15 % (ref 11.5–15.5)
WBC Count: 15 10*3/uL — ABNORMAL HIGH (ref 4.0–10.5)
nRBC: 0 % (ref 0.0–0.2)

## 2021-04-20 LAB — CMP (CANCER CENTER ONLY)
ALT: 13 U/L (ref 0–44)
AST: 13 U/L — ABNORMAL LOW (ref 15–41)
Albumin: 3.3 g/dL — ABNORMAL LOW (ref 3.5–5.0)
Alkaline Phosphatase: 107 U/L (ref 38–126)
Anion gap: 11 (ref 5–15)
BUN: 27 mg/dL — ABNORMAL HIGH (ref 8–23)
CO2: 22 mmol/L (ref 22–32)
Calcium: 9.5 mg/dL (ref 8.9–10.3)
Chloride: 106 mmol/L (ref 98–111)
Creatinine: 0.96 mg/dL (ref 0.61–1.24)
GFR, Estimated: >60 mL/min (ref >60)
Glucose, Bld: 99 mg/dL (ref 70–99)
Potassium: 4.6 mmol/L (ref 3.5–5.1)
Sodium: 139 mmol/L (ref 135–145)
Total Bilirubin: <0.2 mg/dL — ABNORMAL LOW (ref 0.3–1.2)
Total Protein: 7.1 g/dL (ref 6.5–8.1)

## 2021-04-20 MED ORDER — SODIUM CHLORIDE 0.9 % IV SOLN
120.0000 mg/m2 | Freq: Once | INTRAVENOUS | Status: AC
  Administered 2021-04-20: 250 mg via INTRAVENOUS
  Filled 2021-04-20: qty 12.5

## 2021-04-20 MED ORDER — SODIUM CHLORIDE 0.9 % IV SOLN: Freq: Once | INTRAVENOUS | Status: AC

## 2021-04-20 MED ORDER — SODIUM CHLORIDE 0.9 % IV SOLN
60.0000 mg/m2 | Freq: Once | INTRAVENOUS | Status: AC
  Administered 2021-04-20: 123 mg via INTRAVENOUS
  Filled 2021-04-20: qty 123

## 2021-04-20 MED ORDER — SODIUM CHLORIDE 0.9 % IV SOLN
10.0000 mg | Freq: Once | INTRAVENOUS | Status: AC
  Administered 2021-04-20: 10 mg via INTRAVENOUS
  Filled 2021-04-20: qty 10

## 2021-04-20 MED ORDER — HEPARIN SOD (PORK) LOCK FLUSH 100 UNIT/ML IV SOLN
500.0000 [IU] | Freq: Once | INTRAVENOUS | Status: AC | PRN
  Administered 2021-04-20: 500 [IU]

## 2021-04-20 MED ORDER — OXYCODONE HCL 5 MG PO TABS: 5.0000 mg | ORAL_TABLET | Freq: Four times a day (QID) | ORAL | 0 refills | Status: DC | PRN

## 2021-04-20 MED ORDER — SODIUM CHLORIDE 0.9% FLUSH
10.0000 mL | INTRAVENOUS | Status: DC | PRN
  Administered 2021-04-20: 10 mL

## 2021-04-20 MED ORDER — POTASSIUM CHLORIDE IN NACL 20-0.9 MEQ/L-% IV SOLN
INTRAVENOUS | Status: DC
  Filled 2021-04-20 (×2): qty 1000

## 2021-04-20 MED ORDER — PALONOSETRON HCL INJECTION 0.25 MG/5ML
0.2500 mg | Freq: Once | INTRAVENOUS | Status: AC
Start: 2021-04-20 — End: 2021-04-20
  Administered 2021-04-20: 0.25 mg via INTRAVENOUS
  Filled 2021-04-20: qty 5

## 2021-04-20 MED ORDER — SODIUM CHLORIDE 0.9 % IV SOLN
150.0000 mg | Freq: Once | INTRAVENOUS | Status: AC
  Administered 2021-04-20: 150 mg via INTRAVENOUS
  Filled 2021-04-20: qty 150

## 2021-04-20 MED ORDER — SODIUM CHLORIDE 0.9% FLUSH
10.0000 mL | Freq: Once | INTRAVENOUS | Status: AC
  Administered 2021-04-20: 10 mL

## 2021-04-20 MED ORDER — MAGNESIUM SULFATE 2 GM/50ML IV SOLN
2.0000 g | Freq: Once | INTRAVENOUS | Status: AC
  Administered 2021-04-20: 2 g via INTRAVENOUS
  Filled 2021-04-20: qty 50

## 2021-04-20 NOTE — Patient Instructions (Signed)
[facility]  Discharge Instructions: Thank you for choosing [facility] to provide your oncology and hematology care.   If you have a lab appointment with the [facility], please go directly to the [facility] and check in at the registration area.   Wear comfortable clothing and clothing appropriate for easy access to any Portacath or PICC line.   We strive to give you quality time with your provider. You may need to reschedule your appointment if you arrive late (15 or more minutes).  Arriving late affects you and other patients whose appointments are after yours.  Also, if you miss three or more appointments without notifying the office, you may be dismissed from the clinic at the provider's discretion.      For prescription refill requests, have your pharmacy contact our office and allow 72 hours for refills to be completed.    Today you received the following chemotherapy and/or immunotherapy agents:  Cisplatin/Etoposide.      To help prevent nausea and vomiting after your treatment, we encourage you to take your nausea medication as directed.  BELOW ARE SYMPTOMS THAT SHOULD BE REPORTED IMMEDIATELY: *FEVER GREATER THAN 100.4 F (38 C) OR HIGHER *CHILLS OR SWEATING *NAUSEA AND VOMITING THAT IS NOT CONTROLLED WITH YOUR NAUSEA MEDICATION *UNUSUAL SHORTNESS OF BREATH *UNUSUAL BRUISING OR BLEEDING *URINARY PROBLEMS (pain or burning when urinating, or frequent urination) *BOWEL PROBLEMS (unusual diarrhea, constipation, pain near the anus) TENDERNESS IN MOUTH AND THROAT WITH OR WITHOUT PRESENCE OF ULCERS (sore throat, sores in mouth, or a toothache) UNUSUAL RASH, SWELLING OR PAIN  UNUSUAL VAGINAL DISCHARGE OR ITCHING   Items with * indicate a potential emergency and should be followed up as soon as possible or go to the Emergency Department if any problems should occur.  Please show the CHEMOTHERAPY ALERT CARD or IMMUNOTHERAPY ALERT CARD at  check-in to the Emergency Department and triage nurse.  Should you have questions after your visit or need to cancel or reschedule your appointment, please contact [facility]  Dept: [phone]  and follow the prompts.  Office hours are 8:00 a.m. to 4:30 p.m. Monday - Friday. Please note that voicemails left after 4:00 p.m. may not be returned until the following business day.  We are closed weekends and major holidays. You have access to a nurse at all times for urgent questions. Please call the main number to the clinic Dept: [phone] and follow the prompts.   For any non-urgent questions, you may also contact your provider using MyChart. We now offer e-Visits for anyone 55 and older to request care online for non-urgent symptoms. For details visit [email or web address].   Also download the MyChart app! Go to the app store, search "MyChart", open the app, select [facility], and log in with your MyChart username and password.  Due to Covid, a mask is required upon entering the hospital/clinic. If you do not have a mask, one will be given to you upon arrival. For doctor visits, patients may have 1 support person aged 35 or older with them. For treatment visits, patients cannot have anyone with them due to current Covid guidelines and our immunocompromised population.

## 2021-04-20 NOTE — Progress Notes (Signed)
[facility] Telephone:(336) [phone]   Fax:(336) [phone]  OFFICE PROGRESS NOTE  [name], MD [place] [address]  DIAGNOSIS:  Stage IV (pT1c, N1, M1b) large cell neuroendocrine carcinoma.  He presented with a right upper lobe lung nodule and hilar lymphadenopathy.  He was diagnosed in April 2022. He also had a small metastatic brain lesion.    PDL1: 0%   PRIOR THERAPY: Robotic assisted thoracic surgery with a right upper lobectomy which was performed on 12/24/2020.     CURRENT THERAPY: Adjuvant chemotherapy with cisplatin 80 mg/m2 on day 1, etoposide 100 mg per metered squared on days 1, 2, and 3, IV every 3 weeks. First dose on 02/09/21.  Status post 3 cycles.  INTERVAL HISTORY: TALBERT TREMBATH 65 y.o. male returns to the clinic today for follow-up visit.  The patient is feeling fine today with no concerning complaints.  He lost few pounds since his last visit.  He was complaining of pain in the lower extremity that happened 2 nights in a row last week improved with his pain medication.  He denied having any current chest pain, shortness of breath, cough or hemoptysis.  He denied having any fever or chills.  He has no nausea, vomiting, diarrhea or constipation.  He has no headache or visual changes.  He tolerated the last cycle of his treatment fairly well except for the fatigue and aching pain from the Neulasta injection.  He is here today for evaluation before starting cycle #4.  MEDICAL HISTORY: Past Medical History:  Diagnosis Date   Anxiety    Arthritis    Basal cell carcinoma (BCC) of forehead    CAD (coronary artery disease)    a. 10/2015 ant STEMI >> LHC with 3 v CAD; oLAD tx with POBA >> emergent CABG. b. Multiple evals since that time, early graft failure of SVG-RCA by cath 03/2016. c. 2/19 PCI/DES x1 to pRCA, normal EF.   Carotid artery disease ([place])    a. 40-59% BICA 02/2018.   Depression    Dyspnea    Ectopic atrial  tachycardia (HCC)    Esophageal reflux    eosinophil esophagitis   Family history of adverse reaction to anesthesia    "sister has PONV" (06/21/2017)   Former tobacco use    Gout    Hepatitis C    "treated and cured" (06/21/2017)   High cholesterol    History of kidney stones    Hypertension    Ischemic cardiomyopathy    a. EF 25-30% at intraop TEE 4/17  //  b. Limited Echo 5/17 - EF 45-50%, mild ant HK. c. EF 55-65% by cath 09/2017.   Migraine    "3-4/yr" (06/21/2017)   Myocardial infarction ([place]) 10/2015   Palpitations    Sinus bradycardia    a. HR dropping into 40s in 02/2016 -> BB reduced.   Stroke [facility]) 10/2016   "small one; sometimes my memory/cognitive issues" (06/21/2017)   Symptomatic hypotension    a. 02/2016 ER visit -> meds reduced.   Syncope    Wears dentures    Wears glasses     ALLERGIES:  is allergic to prednisone, tetanus toxoids, wellbutrin [bupropion], indomethacin, other, and varenicline.  MEDICATIONS:  Current Outpatient Medications  Medication Sig Dispense Refill   acetaminophen (TYLENOL) 500 MG tablet Take 1,000 mg by mouth every 6 (six) hours as needed for moderate pain.     albuterol (VENTOLIN HFA) 108 (90  Base) MCG/ACT inhaler Inhale 2 puffs into the lungs every 6 (six) hours as needed for wheezing or shortness of breath. 8 g 2   amiodarone (PACERONE) 200 MG tablet Take 1 tablet (200 mg total) by mouth daily. 30 tablet 1   amLODipine (NORVASC) 10 MG tablet TAKE 1 TABLET(10 MG) BY MOUTH DAILY (Patient taking differently: Take 10 mg by mouth daily.) 90 tablet 1   aspirin EC 81 MG tablet Take 81 mg by mouth daily. Swallow whole.     atorvastatin (LIPITOR) 80 MG tablet TAKE 1 TABLET(80 MG) BY MOUTH DAILY 90 tablet 2   cloNIDine (CATAPRES) 0.1 MG tablet Take 0.1 mg by mouth daily as needed (sleep).     clopidogrel (PLAVIX) 75 MG tablet TAKE 1 TABLET BY MOUTH EVERY DAY (Patient taking differently: Take 75 mg by mouth daily.) 90 tablet 1    Diphenhyd-Hydrocort-Nystatin (FIRST-DUKES MOUTHWASH MT) Use as directed 5 mLs in the mouth or throat 4 (four) times daily -  before meals and at bedtime.     HYDROcodone bit-homatropine (HYCODAN) 5-1.5 MG/5ML syrup Take 5 mLs by mouth every 6 (six) hours as needed for cough. 120 mL 0   lidocaine-prilocaine (EMLA) cream Apply 1 application topically once as needed (port access).     magnesium oxide (MAG-OX) 400 (240 Mg) MG tablet Take 1 tablet (400 mg total) by mouth daily. 30 tablet 1   metoprolol succinate (TOPROL-XL) 25 MG 24 hr tablet Take 1 tablet (25 mg total) by mouth daily. 90 tablet 3   naproxen (NAPROSYN) 500 MG tablet Take 1 tablet twice a day, followed by 1 tablet daily as symptoms improve. Discontinue with resolution of symptoms 20 tablet 0   nitroGLYCERIN (NITROSTAT) 0.4 MG SL tablet PLACE 1 TABLET UNDER THE TONGUE EVERY 5 MINUTES AS NEEDED FOR CHEST PAIN. 3 DOSES MAX 25 tablet 4   ondansetron (ZOFRAN) 8 MG tablet Take 1 tablet (8 mg total) by mouth every 8 (eight) hours as needed for nausea or vomiting. Starting 3 days after chemotherapy 30 tablet 2   oxyCODONE (OXY IR/ROXICODONE) 5 MG immediate release tablet Take 1 tablet (5 mg total) by mouth every 6 (six) hours as needed for moderate pain. 15 tablet 0   prochlorperazine (COMPAZINE) 10 MG tablet Take 1 tablet (10 mg total) by mouth every 6 (six) hours as needed. 30 tablet 2   ranolazine (RANEXA) 1000 MG SR tablet Take 1 tablet (1,000 mg total) by mouth 2 (two) times daily. (Patient taking differently: Take 1,000 mg by mouth daily.) 180 tablet 0   Tiotropium Bromide-Olodaterol (STIOLTO RESPIMAT) 2.5-2.5 MCG/ACT AERS Inhale 2 puffs into the lungs daily. 4 g 3   No current facility-administered medications for this visit.   Facility-Administered Medications Ordered in Other Visits  Medication Dose Route Frequency Provider Last Rate Last Admin   0.9 %  sodium chloride infusion   Intravenous Continuous Curt Bears, MD   Stopped at  03/30/21 1145   0.9 % NaCl with KCl 20 mEq/ L  infusion   Intravenous Continuous Curt Bears, MD   Stopped at 03/30/21 1722   sodium chloride flush (NS) 0.9 % injection 10 mL  10 mL Intracatheter PRN Curt Bears, MD   10 mL at 03/30/21 1722    SURGICAL HISTORY:  Past Surgical History:  Procedure Laterality Date   25 GAUGE PARS PLANA VITRECTOMY WITH 20 GAUGE MVR PORT  12/31/2020   Procedure: 25 GAUGE PARS PLANA VITRECTOMY WITH 20 GAUGE MVR PORT;  Surgeon: Melodie Bouillon  O, MD;  Location: [facility];  Service: Thoracic;;   ANTERIOR CERVICAL DECOMP/DISCECTOMY FUSION N/A 10/17/2018   Procedure: Anterior Cervical Decompression Fusion - Cervical seven -Thoracic one;  Surgeon: Consuella Lose, MD;  Location: [facility];  Service: Neurosurgery;  Laterality: N/A;   BASAL CELL CARCINOMA EXCISION     "forehead   BIOPSY  07/20/2019   Procedure: BIOPSY;  Surgeon: Carol Ada, MD;  Location: [facility];  Service: Endoscopy;;   BRONCHIAL BIOPSY  12/24/2020   Procedure: BRONCHIAL BIOPSIES;  Surgeon: Garner Nash, DO;  Location: [facility];  Service: Pulmonary;;   BRONCHIAL BRUSHINGS  12/24/2020   Procedure: BRONCHIAL BRUSHINGS;  Surgeon: Garner Nash, DO;  Location: [facility];  Service: Pulmonary;;   BRONCHIAL NEEDLE ASPIRATION BIOPSY  12/24/2020   Procedure: BRONCHIAL NEEDLE ASPIRATION BIOPSIES;  Surgeon: Garner Nash, DO;  Location: [facility];  Service: Pulmonary;;   CARDIAC CATHETERIZATION N/A 11/28/2015   Procedure: Left Heart Cath and Coronary Angiography;  Surgeon: [name], MD;  Location: [facility];  Service: Cardiovascular;  Laterality: N/A;   CARDIAC CATHETERIZATION N/A 11/28/2015   Procedure: Coronary Balloon Angioplasty;  Surgeon: [name], MD;  Location: [facility];  Service: Cardiovascular;  Laterality: N/A;  ostial LAD   CARDIAC CATHETERIZATION N/A 11/28/2015   Procedure: Coronary/Graft Angiography;  Surgeon: [name],  MD;  Location: [facility];  Service: Cardiovascular;  Laterality: N/A;  coronaries only    CARDIAC CATHETERIZATION N/A 04/21/2016   Procedure: Left Heart Cath and Coronary Angiography;  Surgeon: Wellington Hampshire, MD;  Location: [facility];  Service: Cardiovascular;  Laterality: N/A;   CARDIAC CATHETERIZATION N/A 06/14/2016   Procedure: Left Heart Cath and Cors/Grafts Angiography;  Surgeon: Lorretta Harp, MD;  Location: [facility];  Service: Cardiovascular;  Laterality: N/A;   CARDIAC CATHETERIZATION N/A 09/08/2016   Procedure: Left Heart Cath and Cors/Grafts Angiography;  Surgeon: Wellington Hampshire, MD;  Location: [facility];  Service: Cardiovascular;  Laterality: N/A;   CARDIAC CATHETERIZATION     CORONARY ARTERY BYPASS GRAFT N/A 11/28/2015   Procedure: CORONARY ARTERY BYPASS GRAFTING (CABG) TIMES FIVE USING LEFT INTERNAL MAMMARY ARTERY AND RIGHT GREATER SAPHENOUS,VIEN HARVEATED BY ENDOVIEN, INTRAOPPRATIVE TEE;  Surgeon: Gaye Pollack, MD;  Location: [facility];  Service: Open Heart Surgery;  Laterality: N/A;   CORONARY STENT INTERVENTION N/A 10/05/2017   Procedure: CORONARY STENT INTERVENTION;  Surgeon: [name], MD;  Location: [facility];  Service: Cardiovascular;  Laterality: N/A;   ESOPHAGOGASTRODUODENOSCOPY (EGD) WITH PROPOFOL N/A 07/20/2019   Procedure: ESOPHAGOGASTRODUODENOSCOPY (EGD) WITH PROPOFOL;  Surgeon: Carol Ada, MD;  [facility];  Service: Endoscopy;  Laterality: N/A;   ESOPHAGOGASTRODUODENOSCOPY (EGD) WITH PROPOFOL N/A 01/30/2021   Procedure: ESOPHAGOGASTRODUODENOSCOPY (EGD) WITH PROPOFOL;  Surgeon: Carol Ada, MD;  [facility];  Service: Endoscopy;  Laterality: N/A;   FIDUCIAL MARKER PLACEMENT  12/24/2020   Procedure: FIDUCIAL DYE MARKER PLACEMENT;  Surgeon: Garner Nash, DO;  Location: [facility];  Service: Pulmonary;;   HUMERUS SURGERY Right 1969   "tumor inside bone; filled it w/bone chips"    INTERCOSTAL NERVE BLOCK Right 12/24/2020   Procedure: INTERCOSTAL NERVE BLOCK;  Surgeon: [name], MD;  Location: [facility];  Service: Thoracic;  Laterality: Right;   IR IMAGING GUIDED PORT INSERTION  02/06/2021   LEFT HEART CATH AND CORS/GRAFTS ANGIOGRAPHY N/A 03/11/2017   Procedure: Left Heart Cath and Cors/Grafts Angiography;  Surgeon:  Leonie Man, MD;  Location: [facility];  Service: Cardiovascular;  Laterality: N/A;   LEFT HEART CATH AND CORS/GRAFTS ANGIOGRAPHY N/A 10/05/2017   Procedure: LEFT HEART CATH AND CORS/GRAFTS ANGIOGRAPHY;  Surgeon: [name], MD;  Location: [facility];  Service: Cardiovascular;  Laterality: N/A;   LEFT HEART CATH AND CORS/GRAFTS ANGIOGRAPHY N/A 04/11/2019   Procedure: LEFT HEART CATH AND CORS/GRAFTS ANGIOGRAPHY;  Surgeon: [name], MD;  Location: [facility];  Service: Cardiovascular;  Laterality: N/A;   NODE DISSECTION Right 12/24/2020   Procedure: NODE DISSECTION;  Surgeon: [name], MD;  Location: [facility];  Service: Thoracic;  Laterality: Right;   PERIPHERAL VASCULAR CATHETERIZATION N/A 06/14/2016   Procedure: Lower Extremity Angiography;  Surgeon: Lorretta Harp, MD;  Location: [facility];  Service: Cardiovascular;  Laterality: N/A;   VIDEO BRONCHOSCOPY WITH ENDOBRONCHIAL NAVIGATION Right 12/24/2020   Procedure: VIDEO BRONCHOSCOPY WITH ENDOBRONCHIAL NAVIGATION;  Surgeon: Garner Nash, DO;  Location: [facility];  Service: Pulmonary;  Laterality: Right;   VIDEO BRONCHOSCOPY WITH ENDOBRONCHIAL ULTRASOUND N/A 12/24/2020   Procedure: VIDEO BRONCHOSCOPY WITH ENDOBRONCHIAL ULTRASOUND;  Surgeon: Garner Nash, DO;  Location: [facility];  Service: Pulmonary;  Laterality: N/A;   VIDEO BRONCHOSCOPY WITH INSERTION OF INTERBRONCHIAL VALVE (IBV) N/A 12/31/2020   Procedure: VIDEO BRONCHOSCOPY WITH INSERTION OF INTERBRONCHIAL VALVE (IBV).VALVE IN CARTRIDGE 35m,9mm. CHEST TUBE PLACEMENT.;  Surgeon:  LLajuana Matte MD;  [facility];  Service: Thoracic;  Laterality: N/A;    REVIEW OF SYSTEMS:  A comprehensive review of systems was negative except for: Constitutional: positive for fatigue Musculoskeletal: positive for arthralgias   PHYSICAL EXAMINATION: General appearance: alert, cooperative, fatigued, and no distress Head: Normocephalic, without obvious abnormality, atraumatic Neck: no adenopathy, no JVD, supple, symmetrical, trachea midline, and thyroid not enlarged, symmetric, no tenderness/mass/nodules Lymph nodes: Cervical, supraclavicular, and axillary nodes normal. Resp: clear to auscultation bilaterally Back: negative Cardio: Tachycardic GI: soft, non-tender; bowel sounds normal; no masses,  no organomegaly Extremities: extremities normal, atraumatic, no cyanosis or edema  ECOG PERFORMANCE STATUS: 1 - Symptomatic but completely ambulatory  Blood pressure 102/71, pulse 87, temperature (!) 97.2 F (36.2 C), temperature source Tympanic, resp. rate 20, height _0  (1.753 m), weight 172 lb 8 oz (78.2 kg), SpO2 99 %.  LABORATORY DATA: Lab Results  Component Value Date   WBC 17.1 (H) 04/13/2021   HGB 10.4 (L) 04/13/2021   HCT 31.2 (L) 04/13/2021   MCV 88.6 04/13/2021   PLT 164 04/13/2021      Chemistry      Component Value Date/Time   NA 140 04/13/2021 0840   K 4.5 04/13/2021 0840   CL 104 04/13/2021 0840   CO2 24 04/13/2021 0840   BUN 17 04/13/2021 0840   CREATININE 0.97 04/13/2021 0840   CREATININE 1.00 02/12/2016 1038      Component Value Date/Time   CALCIUM 9.5 04/13/2021 0840   ALKPHOS 132 (H) 04/13/2021 0840   AST 16 04/13/2021 0840   ALT 18 04/13/2021 0840   BILITOT 0.2 (L) 04/13/2021 0840       RADIOGRAPHIC STUDIES: DG Chest Port 1 View  Result Date: 03/22/2021 CLINICAL DATA:  Fever and chills starting last night. Ongoing chemotherapy for lung cancer. EXAM: PORTABLE CHEST 1 VIEW COMPARISON:  02/28/2021 FINDINGS: Fall injectable right  Port-A-Cath tip: Right atrium. Prior CABG. Prior right upper lobectomy with expected volume loss in the right hemithorax. Lower cervical plate and screw fixator. Borderline enlargement  of the cardiopericardial silhouette, without edema. There is some mild blunting of the right lateral costophrenic angle along with some progressive density at the right lung base compared to 02/28/2021; some of this may be from the patient's known pleural effusion but a component of early pneumonia is difficult to exclude given the patient's symptoms. IMPRESSION: 1. Increased density at the right lung base. A component of this may be due to the patient's small right pleural effusion, but early pneumonia at the right lung base is difficult to exclude given the change from 02/28/2021. 2. Prior right upper lobectomy. 3. Borderline enlargement of the cardiopericardial silhouette, without edema. 4. Electronically Signed   By: Van Clines M.D.   On: 03/22/2021 12:17   DG Foot Complete Left  Result Date: 03/26/2021 CLINICAL DATA:  Left foot pain. EXAM: LEFT FOOT - COMPLETE 3+ VIEW COMPARISON:  08/28/2008. FINDINGS: No acute bony or joint abnormality identified. No evidence of fracture dislocation. No radiopaque foreign body. IMPRESSION: No acute abnormality. Electronically Signed   By: Marcello Moores  Register   On: 03/26/2021 10:45    ASSESSMENT AND PLAN: This is a very pleasant 65 years old white male recently diagnosed with a stage IV (T1c, N1, M1 B) large cell neuroendocrine carcinoma presented with right upper lobe lung nodule in addition to right hilar adenopathy in April 2022 and the patient was found to have solitary brain metastasis in May 2022 He is status post right upper lobectomy with lymph node sampling under the care of Dr. Kipp Brood. The patient is currently undergoing systemic chemotherapy with cisplatin 80 mg/M2 on day 1 and etoposide 100 Mg/M2 on days 1, 2 and 3 every 3 weeks.  He is status post 3 cycles.   The  patient has a rough time tolerating this treatment with frequent hospitalization and suspicious recurrent pneumonia on the right side of his lung likely remnant from the previous surgery and scarring. I recommended for the patient to proceed with cycle #4 today. I will see him back for follow-up visit in 3 weeks for evaluation with repeat CT scan of the chest, abdomen and pelvis for restaging of his disease. For the pain management, I will give him refill of oxycodone. The patient was advised to call immediately if he has any concerning symptoms in the interval. The patient voices understanding of current disease status and treatment options and is in agreement with the current care plan. All questions were answered. The patient knows to call the clinic with any problems, questions or concerns. We can certainly see the patient much sooner if necessary.   Disclaimer: This note was dictated with voice recognition software. Similar sounding words can inadvertently be transcribed and may not be corrected upon review.

## 2021-04-21 ENCOUNTER — Inpatient Hospital Stay: Payer: Medicare Other

## 2021-04-21 VITALS — BP 147/78 | HR 96 | Temp 98.7°F | Resp 19 | Wt 175.0 lb

## 2021-04-21 DIAGNOSIS — Z5111 Encounter for antineoplastic chemotherapy: Secondary | ICD-10-CM | POA: Diagnosis not present

## 2021-04-21 DIAGNOSIS — C7A1 Malignant poorly differentiated neuroendocrine tumors: Secondary | ICD-10-CM

## 2021-04-21 MED ORDER — DEXAMETHASONE SODIUM PHOSPHATE 100 MG/10ML IJ SOLN
10.0000 mg | Freq: Once | INTRAMUSCULAR | Status: AC
  Administered 2021-04-21: 10 mg via INTRAVENOUS
  Filled 2021-04-21: qty 10

## 2021-04-21 MED ORDER — HEPARIN SOD (PORK) LOCK FLUSH 100 UNIT/ML IV SOLN
500.0000 [IU] | Freq: Once | INTRAVENOUS | Status: AC | PRN
  Administered 2021-04-21: 500 [IU]

## 2021-04-21 MED ORDER — SODIUM CHLORIDE 0.9% FLUSH
10.0000 mL | INTRAVENOUS | Status: DC | PRN
  Administered 2021-04-21: 10 mL

## 2021-04-21 MED ORDER — SODIUM CHLORIDE 0.9 % IV SOLN: Freq: Once | INTRAVENOUS | Status: AC

## 2021-04-21 MED ORDER — SODIUM CHLORIDE 0.9 % IV SOLN
120.0000 mg/m2 | Freq: Once | INTRAVENOUS | Status: AC
  Administered 2021-04-21: 250 mg via INTRAVENOUS
  Filled 2021-04-21: qty 12.5

## 2021-04-21 NOTE — Patient Instructions (Signed)
[facility]COL[facility]  Discharge Instructions: Thank you for choosing [facility] to provide your oncology and hematology care.   If you have a lab appointment with the [facility], please go directly to the [facility] and check in at the registration area.   Wear comfortable clothing and clothing appropriate for easy access to any Portacath or PICC line.   We strive to give you quality time with your provider. You may need to reschedule your appointment if you arrive late (15 or more minutes).  Arriving late affects you and other patients whose appointments are after yours.  Also, if you miss three or more appointments without notifying the office, you may be dismissed from the clinic at the provider's discretion.      For prescription refill requests, have your pharmacy contact our office and allow 72 hours for refills to be completed.    Today you received the following chemotherapy and/or immunotherapy agents Etoposide      To help prevent nausea and vomiting after your treatment, we encourage you to take your nausea medication as directed.  BELOW ARE SYMPTOMS THAT SHOULD BE REPORTED IMMEDIATELY: *FEVER GREATER THAN 100.4 F (38 C) OR HIGHER *CHILLS OR SWEATING *NAUSEA AND VOMITING THAT IS NOT CONTROLLED WITH YOUR NAUSEA MEDICATION *UNUSUAL SHORTNESS OF BREATH *UNUSUAL BRUISING OR BLEEDING *URINARY PROBLEMS (pain or burning when urinating, or frequent urination) *BOWEL PROBLEMS (unusual diarrhea, constipation, pain near the anus) TENDERNESS IN MOUTH AND THROAT WITH OR WITHOUT PRESENCE OF ULCERS (sore throat, sores in mouth, or a toothache) UNUSUAL RASH, SWELLING OR PAIN  UNUSUAL VAGINAL DISCHARGE OR ITCHING   Items with * indicate a potential emergency and should be followed up as soon as possible or go to the Emergency Department if any problems should occur.  Please show the CHEMOTHERAPY ALERT CARD or IMMUNOTHERAPY ALERT CARD at check-in to  the Emergency Department and triage nurse.  Should you have questions after your visit or need to cancel or reschedule your appointment, please contact [facility]  Dept: [phone]  and follow the prompts.  Office hours are 8:00 a.m. to 4:30 p.m. Monday - Friday. Please note that voicemails left after 4:00 p.m. may not be returned until the following business day.  We are closed weekends and major holidays. You have access to a nurse at all times for urgent questions. Please call the main number to the clinic Dept: [phone] and follow the prompts.   For any non-urgent questions, you may also contact your provider using MyChart. We now offer e-Visits for anyone 110 and older to request care online for non-urgent symptoms. For details visit [email or web address].   Also download the MyChart app! Go to the app store, search "MyChart", open the app, select [facility], and log in with your MyChart username and password.  Due to Covid, a mask is required upon entering the hospital/clinic. If you do not have a mask, one will be given to you upon arrival. For doctor visits, patients may have 1 support person aged 80 or older with them. For treatment visits, patients cannot have anyone with them due to current Covid guidelines and our immunocompromised population.

## 2021-04-22 ENCOUNTER — Other Ambulatory Visit: Payer: Self-pay

## 2021-04-22 ENCOUNTER — Inpatient Hospital Stay: Payer: Medicare Other

## 2021-04-22 VITALS — BP 124/73 | HR 85 | Temp 98.4°F | Resp 16

## 2021-04-22 DIAGNOSIS — C7A1 Malignant poorly differentiated neuroendocrine tumors: Secondary | ICD-10-CM

## 2021-04-22 DIAGNOSIS — Z5111 Encounter for antineoplastic chemotherapy: Secondary | ICD-10-CM | POA: Diagnosis not present

## 2021-04-22 MED ORDER — PALONOSETRON HCL INJECTION 0.25 MG/5ML
0.2500 mg | Freq: Once | INTRAVENOUS | Status: AC
  Administered 2021-04-22: 0.25 mg via INTRAVENOUS
  Filled 2021-04-22: qty 5

## 2021-04-22 MED ORDER — SODIUM CHLORIDE 0.9 % IV SOLN: 8.0000 mg | Freq: Once | INTRAVENOUS | Status: DC

## 2021-04-22 MED ORDER — HEPARIN SOD (PORK) LOCK FLUSH 100 UNIT/ML IV SOLN
500.0000 [IU] | Freq: Once | INTRAVENOUS | Status: AC | PRN
Start: 2021-04-22 — End: 2021-04-22
  Administered 2021-04-22: 500 [IU]

## 2021-04-22 MED ORDER — SODIUM CHLORIDE 0.9% FLUSH
10.0000 mL | INTRAVENOUS | Status: DC | PRN
  Administered 2021-04-22: 10 mL

## 2021-04-22 MED ORDER — SODIUM CHLORIDE 0.9 % IV SOLN
120.0000 mg/m2 | Freq: Once | INTRAVENOUS | Status: AC
  Administered 2021-04-22: 250 mg via INTRAVENOUS
  Filled 2021-04-22: qty 12.5

## 2021-04-22 MED ORDER — SODIUM CHLORIDE 0.9 % IV SOLN: Freq: Once | INTRAVENOUS | Status: AC

## 2021-04-22 MED ORDER — SODIUM CHLORIDE 0.9 % IV SOLN
10.0000 mg | Freq: Once | INTRAVENOUS | Status: AC
  Administered 2021-04-22: 10 mg via INTRAVENOUS
  Filled 2021-04-22: qty 10

## 2021-04-22 MED ORDER — ONDANSETRON HCL 4 MG/2ML IJ SOLN
8.0000 mg | Freq: Once | INTRAMUSCULAR | Status: DC
  Filled 2021-04-22: qty 4

## 2021-04-22 NOTE — Progress Notes (Signed)
Aromatherapy principles discussed with patient, and patient agrees to use aromatherapy to assist with persistent nausea. Provided patient with orange ginger and peppermint inhalers and instructed on proper use. Patient demonstrated proper technique and verbalized understanding of instructions. Patient also received written instructions on how to use inhalers at home. After using inhalers and receiving palonosetron, patient verbalized complete resolution of nausea.

## 2021-04-22 NOTE — Patient Instructions (Signed)
[facility]  Discharge Instructions: Thank you for choosing [facility] to provide your oncology and hematology care.   If you have a lab appointment with the Cancer Center, please go directly to the Cancer Center and check in at the registration area.   Wear comfortable clothing and clothing appropriate for easy access to any Portacath or PICC line.   We strive to give you quality time with your provider. You may need to reschedule your appointment if you arrive late (15 or more minutes).  Arriving late affects you and other patients whose appointments are after yours.  Also, if you miss three or more appointments without notifying the office, you may be dismissed from the clinic at the provider's discretion.      For prescription refill requests, have your pharmacy contact our office and allow 72 hours for refills to be completed.    Today you received the following chemotherapy and/or immunotherapy agents: etoposide.     To help prevent nausea and vomiting after your treatment, we encourage you to take your nausea medication as directed.  BELOW ARE SYMPTOMS THAT SHOULD BE REPORTED IMMEDIATELY: . *FEVER GREATER THAN 100.4 F (38 C) OR HIGHER . *CHILLS OR SWEATING . *NAUSEA AND VOMITING THAT IS NOT CONTROLLED WITH YOUR NAUSEA MEDICATION . *UNUSUAL SHORTNESS OF BREATH . *UNUSUAL BRUISING OR BLEEDING . *URINARY PROBLEMS (pain or burning when urinating, or frequent urination) . *BOWEL PROBLEMS (unusual diarrhea, constipation, pain near the anus) . TENDERNESS IN MOUTH AND THROAT WITH OR WITHOUT PRESENCE OF ULCERS (sore throat, sores in mouth, or a toothache) . UNUSUAL RASH, SWELLING OR PAIN  . UNUSUAL VAGINAL DISCHARGE OR ITCHING   Items with * indicate a potential emergency and should be followed up as soon as possible or go to the Emergency Department if any problems should occur.  Please show the CHEMOTHERAPY ALERT CARD or IMMUNOTHERAPY ALERT  CARD at check-in to the Emergency Department and triage nurse.  Should you have questions after your visit or need to cancel or reschedule your appointment, please contact [facility]  Dept: [phone]  and follow the prompts.  Office hours are 8:00 a.m. to 4:30 p.m. Monday - Friday. Please note that voicemails left after 4:00 p.m. may not be returned until the following business day.  We are closed weekends and major holidays. You have access to a nurse at all times for urgent questions. Please call the main number to the clinic Dept: [phone] and follow the prompts.   For any non-urgent questions, you may also contact your provider using MyChart. We now offer e-Visits for anyone 18 and older to request care online for non-urgent symptoms. For details visit [email or web address].   Also download the MyChart app! Go to the app store, search "MyChart", open the app, select [facility], and log in with your MyChart username and password.  Due to Covid, a mask is required upon entering the hospital/clinic. If you do not have a mask, one will be given to you upon arrival. For doctor visits, patients may have 1 support person aged 18 or older with them. For treatment visits, patients cannot have anyone with them due to current Covid guidelines and our immunocompromised population.   

## 2021-04-22 NOTE — Progress Notes (Signed)
Patient c/o persistent nausea. States he is taking prochlorperazine and ondansetron as prescribed. Took both prior to arrival to infusion this morning. Dr. [name] aware of concern. Additional orders received. Dicie Beam, RPh aware of patient's continued issue with nausea. Teldrin will discuss with Dr. [name].

## 2021-04-24 ENCOUNTER — Inpatient Hospital Stay: Payer: Medicare Other

## 2021-04-24 ENCOUNTER — Other Ambulatory Visit: Payer: Self-pay

## 2021-04-24 DIAGNOSIS — Z5111 Encounter for antineoplastic chemotherapy: Secondary | ICD-10-CM | POA: Diagnosis not present

## 2021-04-24 DIAGNOSIS — C7A1 Malignant poorly differentiated neuroendocrine tumors: Secondary | ICD-10-CM

## 2021-04-24 MED ORDER — PEGFILGRASTIM-CBQV 6 MG/0.6ML ~~LOC~~ SOSY
6.0000 mg | PREFILLED_SYRINGE | Freq: Once | SUBCUTANEOUS | Status: AC
  Administered 2021-04-24: 6 mg via SUBCUTANEOUS
  Filled 2021-04-24: qty 0.6

## 2021-04-25 ENCOUNTER — Encounter: Payer: Self-pay | Admitting: Internal Medicine

## 2021-04-25 ENCOUNTER — Encounter: Payer: Self-pay | Admitting: Physician Assistant

## 2021-04-26 ENCOUNTER — Emergency Department (HOSPITAL_BASED_OUTPATIENT_CLINIC_OR_DEPARTMENT_OTHER): Payer: Medicare Other

## 2021-04-26 ENCOUNTER — Emergency Department (HOSPITAL_BASED_OUTPATIENT_CLINIC_OR_DEPARTMENT_OTHER)
Admission: EM | Admit: 2021-04-26 | Discharge: 2021-04-27 | Disposition: A | Discharge status: Home / Self Care | Payer: Medicare Other | Source: Home / Self Care | Attending: Emergency Medicine | Admitting: Emergency Medicine

## 2021-04-26 ENCOUNTER — Other Ambulatory Visit: Payer: Self-pay

## 2021-04-26 ENCOUNTER — Encounter (HOSPITAL_BASED_OUTPATIENT_CLINIC_OR_DEPARTMENT_OTHER): Payer: Self-pay

## 2021-04-26 DIAGNOSIS — Z85118 Personal history of other malignant neoplasm of bronchus and lung: Secondary | ICD-10-CM | POA: Insufficient documentation

## 2021-04-26 DIAGNOSIS — R000 Tachycardia, unspecified: Secondary | ICD-10-CM | POA: Insufficient documentation

## 2021-04-26 DIAGNOSIS — Z79899 Other long term (current) drug therapy: Secondary | ICD-10-CM | POA: Insufficient documentation

## 2021-04-26 DIAGNOSIS — Z86011 Personal history of benign neoplasm of the brain: Secondary | ICD-10-CM | POA: Insufficient documentation

## 2021-04-26 DIAGNOSIS — I25119 Atherosclerotic heart disease of native coronary artery with unspecified angina pectoris: Secondary | ICD-10-CM | POA: Insufficient documentation

## 2021-04-26 DIAGNOSIS — I129 Hypertensive chronic kidney disease with stage 1 through stage 4 chronic kidney disease, or unspecified chronic kidney disease: Secondary | ICD-10-CM | POA: Insufficient documentation

## 2021-04-26 DIAGNOSIS — D61810 Antineoplastic chemotherapy induced pancytopenia: Secondary | ICD-10-CM | POA: Diagnosis not present

## 2021-04-26 DIAGNOSIS — R112 Nausea with vomiting, unspecified: Secondary | ICD-10-CM | POA: Insufficient documentation

## 2021-04-26 DIAGNOSIS — Z87891 Personal history of nicotine dependence: Secondary | ICD-10-CM | POA: Insufficient documentation

## 2021-04-26 DIAGNOSIS — I2694 Multiple subsegmental pulmonary emboli without acute cor pulmonale: Secondary | ICD-10-CM

## 2021-04-26 DIAGNOSIS — Z85828 Personal history of other malignant neoplasm of skin: Secondary | ICD-10-CM | POA: Insufficient documentation

## 2021-04-26 DIAGNOSIS — N183 Chronic kidney disease, stage 3 unspecified: Secondary | ICD-10-CM | POA: Insufficient documentation

## 2021-04-26 DIAGNOSIS — I959 Hypotension, unspecified: Secondary | ICD-10-CM | POA: Diagnosis not present

## 2021-04-26 DIAGNOSIS — Z955 Presence of coronary angioplasty implant and graft: Secondary | ICD-10-CM | POA: Insufficient documentation

## 2021-04-26 DIAGNOSIS — R0602 Shortness of breath: Secondary | ICD-10-CM | POA: Insufficient documentation

## 2021-04-26 DIAGNOSIS — Z7982 Long term (current) use of aspirin: Secondary | ICD-10-CM | POA: Insufficient documentation

## 2021-04-26 LAB — URINALYSIS, ROUTINE W REFLEX MICROSCOPIC
Bilirubin Urine: NEGATIVE
Glucose, UA: NEGATIVE mg/dL
Hgb urine dipstick: NEGATIVE
Ketones, ur: NEGATIVE mg/dL
Leukocytes,Ua: NEGATIVE
Nitrite: NEGATIVE
Protein, ur: NEGATIVE mg/dL
Specific Gravity, Urine: 1.015 (ref 1.005–1.030)
pH: 7 (ref 5.0–8.0)

## 2021-04-26 LAB — CBC WITH DIFFERENTIAL/PLATELET
Abs Immature Granulocytes: 0 10*3/uL (ref 0.00–0.07)
Basophils Absolute: 0 10*3/uL (ref 0.0–0.1)
Basophils Relative: 0 %
Eosinophils Absolute: 0 10*3/uL (ref 0.0–0.5)
Eosinophils Relative: 0 %
HCT: 29.5 % — ABNORMAL LOW (ref 39.0–52.0)
Hemoglobin: 9.9 g/dL — ABNORMAL LOW (ref 13.0–17.0)
Lymphocytes Relative: 6 %
Lymphs Abs: 2.1 10*3/uL (ref 0.7–4.0)
MCH: 30.2 pg (ref 26.0–34.0)
MCHC: 33.6 g/dL (ref 30.0–36.0)
MCV: 89.9 fL (ref 80.0–100.0)
Monocytes Absolute: 0 10*3/uL — ABNORMAL LOW (ref 0.1–1.0)
Monocytes Relative: 0 %
Neutro Abs: 33.3 10*3/uL — ABNORMAL HIGH (ref 1.7–7.7)
Neutrophils Relative %: 94 %
Platelets: 235 10*3/uL (ref 150–400)
RBC: 3.28 MIL/uL — ABNORMAL LOW (ref 4.22–5.81)
RDW: 15.9 % — ABNORMAL HIGH (ref 11.5–15.5)
Smear Review: UNDETERMINED
WBC: 35.4 10*3/uL — ABNORMAL HIGH (ref 4.0–10.5)
nRBC: 0 % (ref 0.0–0.2)

## 2021-04-26 LAB — COMPREHENSIVE METABOLIC PANEL
ALT: 14 U/L (ref 0–44)
AST: 18 U/L (ref 15–41)
Albumin: 3.4 g/dL — ABNORMAL LOW (ref 3.5–5.0)
Alkaline Phosphatase: 106 U/L (ref 38–126)
Anion gap: 9 (ref 5–15)
BUN: 26 mg/dL — ABNORMAL HIGH (ref 8–23)
CO2: 27 mmol/L (ref 22–32)
Calcium: 8.9 mg/dL (ref 8.9–10.3)
Chloride: 100 mmol/L (ref 98–111)
Creatinine, Ser: 1.03 mg/dL (ref 0.61–1.24)
GFR, Estimated: >60 mL/min (ref >60)
Glucose, Bld: 88 mg/dL (ref 70–99)
Potassium: 4 mmol/L (ref 3.5–5.1)
Sodium: 136 mmol/L (ref 135–145)
Total Bilirubin: 0.3 mg/dL (ref 0.3–1.2)
Total Protein: 6.5 g/dL (ref 6.5–8.1)

## 2021-04-26 LAB — PROTIME-INR
INR: 1 (ref 0.8–1.2)
Prothrombin Time: 12.8 seconds (ref 11.4–15.2)

## 2021-04-26 LAB — LACTIC ACID, PLASMA: Lactic Acid, Venous: 1.7 mmol/L (ref 0.5–1.9)

## 2021-04-26 LAB — TROPONIN I (HIGH SENSITIVITY): Troponin I (High Sensitivity): 4 ng/L (ref <18)

## 2021-04-26 LAB — APTT: aPTT: 33 seconds (ref 24–36)

## 2021-04-26 MED ORDER — LACTATED RINGERS IV BOLUS
1000.0000 mL | Freq: Once | INTRAVENOUS | Status: AC
  Administered 2021-04-26: 1000 mL via INTRAVENOUS

## 2021-04-26 MED ORDER — LIDOCAINE VISCOUS HCL 2 % MT SOLN
15.0000 mL | Freq: Once | OROMUCOSAL | Status: AC
  Administered 2021-04-26: 15 mL via ORAL
  Filled 2021-04-26: qty 15

## 2021-04-26 MED ORDER — MORPHINE SULFATE (PF) 4 MG/ML IV SOLN
4.0000 mg | Freq: Once | INTRAVENOUS | Status: AC
Start: 2021-04-26 — End: 2021-04-26
  Administered 2021-04-26: 4 mg via INTRAVENOUS
  Filled 2021-04-26: qty 1

## 2021-04-26 MED ORDER — MORPHINE SULFATE (PF) 2 MG/ML IV SOLN
2.0000 mg | Freq: Once | INTRAVENOUS | Status: AC
  Administered 2021-04-26: 2 mg via INTRAVENOUS
  Filled 2021-04-26: qty 1

## 2021-04-26 MED ORDER — ONDANSETRON HCL 4 MG/2ML IJ SOLN
4.0000 mg | Freq: Once | INTRAMUSCULAR | Status: AC
  Administered 2021-04-26: 4 mg via INTRAVENOUS
  Filled 2021-04-26: qty 2

## 2021-04-26 MED ORDER — PANTOPRAZOLE SODIUM 40 MG IV SOLR
40.0000 mg | Freq: Once | INTRAVENOUS | Status: AC
  Administered 2021-04-26: 40 mg via INTRAVENOUS
  Filled 2021-04-26: qty 40

## 2021-04-26 MED ORDER — ALUM & MAG HYDROXIDE-SIMETH 200-200-20 MG/5ML PO SUSP
30.0000 mL | Freq: Once | ORAL | Status: AC
  Administered 2021-04-26: 30 mL via ORAL
  Filled 2021-04-26: qty 30

## 2021-04-26 NOTE — ED Provider Notes (Signed)
[facility] Provider Note   CSN: [id number] Arrival date & time: 04/26/21  1621     History Chief Complaint  Patient presents with   Shortness of Breath    [name] is a 65 y.o. 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  States that he recently had a first dose of his fourth round of chemotherapy on Monday 8/22, and began to feel poorly on Wednesday 8/24.  Endorses severe nausea with NBNB emesis, states he is vomitting mostly mucous. States that he has interim episodes of his heart racing and during his episodes he feels short of breath.  At rest he does not feel short of breath and when his heart rate is not elevated he does not feel short of breath.  Denies any fevers or chills, cough, or chest pain.  He does have some pain at the scar sites for his prior lobectomy, which he states occurs each time after his chemotherapy.  I personally read this patient's medical records.  He has history of coronary artery disease with ischemic cardiomyopathy, bradycardia, lung cancer, CVA.  Anticoagulated on aspirin and Plavix.  HPI     Past Medical History:  Diagnosis Date   Anxiety    Arthritis    Basal cell carcinoma (BCC) of forehead    CAD (coronary artery disease)    a. 10/2015 ant STEMI >> LHC with 3 v CAD; oLAD tx with POBA >> emergent CABG. b. Multiple evals since that time, early graft failure of SVG-RCA by cath 03/2016. c. 2/19 PCI/DES x1 to pRCA, normal EF.   Carotid artery disease ([place])    a. 40-59% BICA 02/2018.   Depression    Dyspnea    Ectopic atrial tachycardia (HCC)    Esophageal reflux    eosinophil esophagitis   Family history of adverse reaction to anesthesia    "sister has PONV" (06/21/2017)   Former tobacco use    Gout    Hepatitis C    "treated and cured" (06/21/2017)   High cholesterol    History of kidney stones     Hypertension    Ischemic cardiomyopathy    a. EF 25-30% at intraop TEE 4/17  //  b. Limited Echo 5/17 - EF 45-50%, mild ant HK. c. EF 55-65% by cath 09/2017.   Migraine    "3-4/yr" (06/21/2017)   Myocardial infarction ([place]) 10/2015   Palpitations    Sinus bradycardia    a. HR dropping into 40s in 02/2016 -> BB reduced.   Stroke [facility]) 10/2016   "small one; sometimes my memory/cognitive issues" (06/21/2017)   Symptomatic hypotension    a. 02/2016 ER visit -> meds reduced.   Syncope    Wears dentures    Wears glasses     Patient Active Problem List   Diagnosis Date Noted   Hypomagnesemia 04/08/2021   Port-A-Cath in place 04/07/2021   Protein-calorie malnutrition, severe 03/25/2021   CAP (community acquired pneumonia) 03/23/2021   PNA (pneumonia) 03/22/2021   Community acquired pneumonia 03/22/2021   CKD (chronic kidney disease), stage III ([place]) 03/22/2021   Hypoxia 02/28/2021   Dyspnea 02/28/2021   AKI (acute kidney injury) ([place]) 02/25/2021   AF (paroxysmal atrial fibrillation) ([place]) 02/25/2021   Pleural effusion 02/25/2021   Neutropenia, drug-induced ([place]) 02/24/2021   Positive blood cultures 02/17/2021   Positive blood culture 02/16/2021   Right flank pain 02/09/2021  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[place]) 01/30/2021   Encounter for antineoplastic chemotherapy 01/29/2021   Goals of care, counseling/discussion 01/14/2021   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[place]) 01/14/2021   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[place]) 01/14/2021   Acute back pain with sciatica 01/12/2021   Allergic rhinitis 01/12/2021   Amnesia 01/12/2021   Kidney stone 01/12/2021   Sciatica 01/12/2021   Bipolar disorder ([place]) 01/12/2021   S/P lobectomy of lung 12/24/2020   Solitary lung nodule 11/28/2020   Chest pain 11/07/2019   Gastroesophageal reflux disease    Cervical radiculopathy 10/17/2018   Preoperative clearance 10/04/2018   Palpitations    Coronary artery disease  involving coronary bypass graft of native heart with angina pectoris ([place])    Transient loss of consciousness 03/24/2018   Ectopic atrial tachycardia ([place]) 02/09/2018   Central chest pain 03/10/2017   Family hx-stroke 11/10/2016   Stroke-like episode ([place]) - R brain, s/p tPA 11/09/2016   Unstable angina ([place]) 09/07/2016   Claudication of both lower extremities ([place])    Pure hypercholesterolemia    Tobacco abuse disorder    CAD of autologous artery bypass graft without angina    Chest pain at rest 06/10/2016   Abnormal nuclear stress test - HIGH RISK 04/20/2016   Old MI (myocardial infarction)    Essential hypertension 02/26/2016   Ischemic cardiomyopathy 12/25/2015   Hyperlipidemia LDL goal <70 12/25/2015   Mild tobacco abuse in early remission 11/28/2015   Coronary artery disease involving native coronary artery of native heart with angina pectoris ([place]) 11/28/2015   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   Acute MI anterior wall first episode care [facility])    Precordial chest pain 03/07/2015   Gout attack 03/07/2015   Mixed bipolar I disorder ([place]) 03/07/2015   Fibromyalgia 07/09/2014   Gout 07/09/2014   Anxiety 07/09/2014   Depression 07/09/2014   Hepatitis C 40/97/3532   Eosinophilic esophagitis 99/24/2683    Past Surgical History:  Procedure Laterality Date   25 GAUGE PARS PLANA VITRECTOMY WITH 20 GAUGE MVR PORT  12/31/2020   Procedure: 25 GAUGE PARS PLANA VITRECTOMY WITH 20 GAUGE MVR PORT;  Surgeon: [name], MD;  Location: [facility] OR;  Service: Thoracic;;   ANTERIOR CERVICAL DECOMP/DISCECTOMY FUSION N/A 10/17/2018   Procedure: Anterior Cervical Decompression Fusion - Cervical seven -Thoracic one;  Surgeon: Consuella Lose, MD;  Location: [facility];  Service: Neurosurgery;  Laterality: N/A;   BASAL CELL CARCINOMA EXCISION     "forehead   BIOPSY  07/20/2019   Procedure: BIOPSY;  Surgeon: Carol Ada, MD;  Location: [facility];  Service: Endoscopy;;   BRONCHIAL BIOPSY  12/24/2020    Procedure: BRONCHIAL BIOPSIES;  Surgeon: Garner Nash, DO;  Location: [facility];  Service: Pulmonary;;   BRONCHIAL BRUSHINGS  12/24/2020   Procedure: BRONCHIAL BRUSHINGS;  Surgeon: Garner Nash, DO;  Location: [place];  Service: Pulmonary;;   BRONCHIAL NEEDLE ASPIRATION BIOPSY  12/24/2020   Procedure: BRONCHIAL NEEDLE ASPIRATION BIOPSIES;  Surgeon: Garner Nash, DO;  Location: [place];  Service: Pulmonary;;   CARDIAC CATHETERIZATION N/A 11/28/2015   Procedure: Left Heart Cath and Coronary Angiography;  Surgeon: [name], MD;  Location: [place];  Service: Cardiovascular;  Laterality: N/A;   CARDIAC CATHETERIZATION N/A 11/28/2015   Procedure: Coronary Balloon Angioplasty;  Surgeon: [name], MD;  Location: [place] [facility];  Service: Cardiovascular;  Laterality: N/A;  ostial LAD   CARDIAC CATHETERIZATION N/A 11/28/2015   Procedure: Coronary/Graft  Angiography;  Surgeon: [name], MD;  Location: [place] [facility];  Service: Cardiovascular;  Laterality: N/A;  coronaries only    CARDIAC CATHETERIZATION N/A 04/21/2016   Procedure: Left Heart Cath and Coronary Angiography;  Surgeon: Wellington Hampshire, MD;  Location: [place] [facility];  Service: Cardiovascular;  Laterality: N/A;   CARDIAC CATHETERIZATION N/A 06/14/2016   Procedure: Left Heart Cath and Cors/Grafts Angiography;  Surgeon: Lorretta Harp, MD;  Location: [place] [facility];  Service: Cardiovascular;  Laterality: N/A;   CARDIAC CATHETERIZATION N/A 09/08/2016   Procedure: Left Heart Cath and Cors/Grafts Angiography;  Surgeon: Wellington Hampshire, MD;  Location: [place] [facility];  Service: Cardiovascular;  Laterality: N/A;   CARDIAC CATHETERIZATION     CORONARY ARTERY BYPASS GRAFT N/A 11/28/2015   Procedure: CORONARY ARTERY BYPASS GRAFTING (CABG) TIMES FIVE USING LEFT INTERNAL MAMMARY ARTERY AND RIGHT GREATER SAPHENOUS,VIEN HARVEATED BY ENDOVIEN, INTRAOPPRATIVE TEE;  Surgeon: Gaye Pollack, MD;  Location: [facility];  Service: Open Heart Surgery;  Laterality: N/A;   CORONARY STENT INTERVENTION N/A 10/05/2017   Procedure: CORONARY STENT INTERVENTION;  Surgeon: [name], MD;  Location: [place] [facility];  Service: Cardiovascular;  Laterality: N/A;   ESOPHAGOGASTRODUODENOSCOPY (EGD) WITH PROPOFOL N/A 07/20/2019   Procedure: ESOPHAGOGASTRODUODENOSCOPY (EGD) WITH PROPOFOL;  Surgeon: Carol Ada, MD;  [facility];  Service: Endoscopy;  Laterality: N/A;   ESOPHAGOGASTRODUODENOSCOPY (EGD) WITH PROPOFOL N/A 01/30/2021   Procedure: ESOPHAGOGASTRODUODENOSCOPY (EGD) WITH PROPOFOL;  Surgeon: Carol Ada, MD;  [facility];  Service: Endoscopy;  Laterality: N/A;   FIDUCIAL MARKER PLACEMENT  12/24/2020   Procedure: FIDUCIAL DYE MARKER PLACEMENT;  Surgeon: Garner Nash, DO;  Location: [facility];  Service: Pulmonary;;   HUMERUS SURGERY Right 1969   "tumor inside bone; filled it w/bone chips"   INTERCOSTAL NERVE BLOCK Right 12/24/2020   Procedure: INTERCOSTAL NERVE BLOCK;  Surgeon: [name], MD;  Location: [facility];  Service: Thoracic;  Laterality: Right;   IR IMAGING GUIDED PORT INSERTION  02/06/2021   LEFT HEART CATH AND CORS/GRAFTS ANGIOGRAPHY N/A 03/11/2017   Procedure: Left Heart Cath and Cors/Grafts Angiography;  Surgeon: Leonie Man, MD;  Location: [facility];  Service: Cardiovascular;  Laterality: N/A;   LEFT HEART CATH AND CORS/GRAFTS ANGIOGRAPHY N/A 10/05/2017   Procedure: LEFT HEART CATH AND CORS/GRAFTS ANGIOGRAPHY;  Surgeon: [name], MD;  Location: [place] [facility];  Service: Cardiovascular;  Laterality: N/A;   LEFT HEART CATH AND CORS/GRAFTS ANGIOGRAPHY N/A 04/11/2019   Procedure: LEFT HEART CATH AND CORS/GRAFTS ANGIOGRAPHY;  Surgeon: [name], MD;  Location: [place] [facility];  Service: Cardiovascular;  Laterality: N/A;   NODE DISSECTION Right 12/24/2020   Procedure: NODE DISSECTION;  Surgeon:  [name], MD;  Location: [place];  Service: Thoracic;  Laterality: Right;   PERIPHERAL VASCULAR CATHETERIZATION N/A 06/14/2016   Procedure: Lower Extremity Angiography;  Surgeon: Lorretta Harp, MD;  Location: [place] [facility];  Service: Cardiovascular;  Laterality: N/A;   VIDEO BRONCHOSCOPY WITH ENDOBRONCHIAL NAVIGATION Right 12/24/2020   Procedure: VIDEO BRONCHOSCOPY WITH ENDOBRONCHIAL NAVIGATION;  Surgeon: Garner Nash, DO;  Location: [facility];  Service: Pulmonary;  Laterality: Right;   VIDEO BRONCHOSCOPY WITH ENDOBRONCHIAL ULTRASOUND N/A 12/24/2020   Procedure: VIDEO BRONCHOSCOPY WITH ENDOBRONCHIAL ULTRASOUND;  Surgeon: Garner Nash, DO;  Location: [place] [facility];  Service: Pulmonary;  Laterality: N/A;   VIDEO BRONCHOSCOPY WITH INSERTION OF INTERBRONCHIAL VALVE (IBV) N/A 12/31/2020  Procedure: VIDEO BRONCHOSCOPY WITH INSERTION OF INTERBRONCHIAL VALVE (IBV).VALVE IN CARTRIDGE 48mm,9mm. CHEST TUBE PLACEMENT.;  Surgeon: [name], MD;  [facility];  Service: Thoracic;  Laterality: N/A;       Family History  Problem Relation Age of [place]   Lung cancer Mother    Heart Problems Father    Heart attack Father 52   Stroke Father    Heart failure Father    Heart attack Maternal Grandmother    Stroke Maternal Grandmother    Heart attack Paternal Uncle    Hypertension Brother    Autoimmune disease Neg Hx     Social History   Tobacco Use   Smoking status: Former    Packs/day: 0.75    Years: 44.00    Pack years: 33.00    Types: Cigarettes    Quit date: 11/28/2015    Years since quitting: 5.4   Smokeless tobacco: Never  Vaping Use   Vaping Use: Never used  Substance Use Topics   Alcohol use: Yes    Comment: occ   Drug use: No    Comment: 06/21/2017 "nothing since the 1980s"    Home Medications Prior to Admission medications   Medication Sig Start Date End Date Taking? Authorizing Provider  acetaminophen (TYLENOL) 500 MG tablet Take 1,000 mg by  mouth every 6 (six) hours as needed for moderate pain.    [provider]  albuterol (VENTOLIN HFA) 108 (90 Base) MCG/ACT inhaler Inhale 2 puffs into the lungs every 6 (six) hours as needed for wheezing or shortness of breath. 01/12/21   Garner Nash, DO  amiodarone (PACERONE) 200 MG tablet Take 1 tablet (200 mg total) by mouth daily. 01/07/21   Elgie Collard, PA-C  amLODipine (NORVASC) 10 MG tablet TAKE 1 TABLET(10 MG) BY MOUTH DAILY Patient taking differently: Take 10 mg by mouth daily. 12/02/20   [name], MD  aspirin EC 81 MG tablet Take 81 mg by mouth daily. Swallow whole.    [provider]  atorvastatin (LIPITOR) 80 MG tablet TAKE 1 TABLET(80 MG) BY MOUTH DAILY 04/01/21   [name], MD  cloNIDine (CATAPRES) 0.1 MG tablet Take 0.1 mg by mouth daily as needed (sleep). 03/12/21   [provider]  clopidogrel (PLAVIX) 75 MG tablet TAKE 1 TABLET BY MOUTH EVERY DAY Patient taking differently: Take 75 mg by mouth daily. 02/06/20   [name], MD  Diphenhyd-Hydrocort-Nystatin (FIRST-DUKES MOUTHWASH MT) Use as directed 5 mLs in the mouth or throat 4 (four) times daily -  before meals and at bedtime. 03/15/21   [provider]  HYDROcodone bit-homatropine (HYCODAN) 5-1.5 MG/5ML syrup Take 5 mLs by mouth every 6 (six) hours as needed for cough. 03/30/21   Curt Bears, MD  lidocaine-prilocaine (EMLA) cream Apply 1 application topically once as needed (port access). 02/27/21   Aline August, MD  magnesium oxide (MAG-OX) 400 (240 Mg) MG tablet Take 1 tablet (400 mg total) by mouth daily. 04/07/21   Heilingoetter, Cassandra L, PA-C  metoprolol succinate (TOPROL-XL) 25 MG 24 hr tablet Take 1 tablet (25 mg total) by mouth daily. 08/27/20   [name], MD  naproxen (NAPROSYN) 500 MG tablet Take 1 tablet twice a day, followed by 1 tablet daily as symptoms improve. Discontinue with resolution of symptoms 04/07/21   Heilingoetter, Cassandra L,  PA-C  nitroGLYCERIN (NITROSTAT) 0.4 MG SL tablet PLACE 1 TABLET UNDER THE TONGUE EVERY 5 MINUTES AS NEEDED FOR CHEST PAIN. 3  DOSES MAX 04/16/20   [name], MD  ondansetron (ZOFRAN) 8 MG tablet Take 1 tablet (8 mg total) by mouth every 8 (eight) hours as needed for nausea or vomiting. Starting 3 days after chemotherapy 04/01/21   Heilingoetter, Cassandra L, PA-C  oxyCODONE (OXY IR/ROXICODONE) 5 MG immediate release tablet Take 1 tablet (5 mg total) by mouth every 6 (six) hours as needed for moderate pain. 04/20/21   Curt Bears, MD  prochlorperazine (COMPAZINE) 10 MG tablet Take 1 tablet (10 mg total) by mouth every 6 (six) hours as needed. 01/14/21   Heilingoetter, Cassandra L, PA-C  ranolazine (RANEXA) 1000 MG SR tablet Take 1 tablet (1,000 mg total) by mouth 2 (two) times daily. Patient taking differently: Take 1,000 mg by mouth daily. 11/08/19   [name], NP  Tiotropium Bromide-Olodaterol (STIOLTO RESPIMAT) 2.5-2.5 MCG/ACT AERS Inhale 2 puffs into the lungs daily. 01/12/21   Garner Nash, DO    Allergies    Prednisone, Tetanus toxoids, Wellbutrin [bupropion], Indomethacin, Other, and Varenicline  Review of Systems   Review of Systems  Constitutional:  Positive for activity change, appetite change and fatigue. Negative for chills and fever.  HENT: Negative.    Respiratory:  Positive for shortness of breath.   Cardiovascular:  Positive for palpitations. Negative for chest pain and leg swelling.  Gastrointestinal:  Positive for nausea and vomiting. Negative for abdominal distention, abdominal pain, anal bleeding, constipation and diarrhea.  Genitourinary: Negative.   Musculoskeletal: Negative.   Neurological:  Positive for weakness. Negative for dizziness, light-headedness and headaches.   Physical Exam Updated Vital Signs BP (!) 117/92   Pulse 91   Temp 98.9 F (37.2 C) (Oral)   Resp 20   Ht 5\' 9"  (1.753 m)   Wt 79.4 kg   SpO2 97%   BMI 25.84 kg/m    Physical Exam Vitals and nursing note reviewed.  Constitutional:      Appearance: He is overweight. He is not toxic-appearing.  HENT:     Head: Normocephalic and atraumatic.     Nose: Nose normal.     Mouth/Throat:     Mouth: Mucous membranes are moist.     Pharynx: Oropharynx is clear. Uvula midline. No oropharyngeal exudate or posterior oropharyngeal erythema.     Tonsils: No tonsillar exudate.  Eyes:     General: Lids are normal. Vision grossly intact.        Right eye: No discharge.        Left eye: No discharge.     Extraocular Movements: Extraocular movements intact.     Conjunctiva/sclera: Conjunctivae normal.     Pupils: Pupils are equal, round, and reactive to light.  Neck:     Trachea: Trachea and phonation normal.  Cardiovascular:     Rate and Rhythm: Regular rhythm. Tachycardia present.     Pulses: Normal pulses.     Heart sounds: Normal heart sounds. No murmur heard. Pulmonary:     Effort: Pulmonary effort is normal. No tachypnea, bradypnea, accessory muscle usage, prolonged expiration or respiratory distress.     Breath sounds: Normal breath sounds. No decreased breath sounds, wheezing or rales.  Chest:     Chest wall: No mass, deformity, swelling, tenderness, crepitus or edema.  Abdominal:     General: Bowel sounds are normal. There is no distension.     Palpations: Abdomen is soft.     Tenderness: There is no abdominal tenderness. There is no guarding or rebound.  Musculoskeletal:  General: No deformity. Normal range of motion.     Cervical back: Normal range of motion and neck supple. No edema, rigidity or crepitus. No pain with movement, spinous process tenderness or muscular tenderness.     Right lower leg: No tenderness. No edema.     Left lower leg: No tenderness. No edema.  Lymphadenopathy:     Cervical: No cervical adenopathy.  Skin:    General: Skin is warm and dry.     Capillary Refill: Capillary refill takes less than 2 seconds.      Findings: No rash.  Neurological:     General: No focal deficit present.     Mental Status: He is alert and oriented to person, place, and time. Mental status is at baseline.     GCS: GCS eye subscore is 4. GCS verbal subscore is 5. GCS motor subscore is 6.  Psychiatric:        Mood and Affect: Mood normal.    ED Results / Procedures / Treatments   Labs (all labs ordered are listed, but only abnormal results are displayed) Labs Reviewed  COMPREHENSIVE METABOLIC PANEL - Abnormal; Notable for the following components:      Result Value   BUN 26 (*)    Albumin 3.4 (*)    All other components within normal limits  CBC WITH DIFFERENTIAL/PLATELET - Abnormal; Notable for the following components:   WBC 35.4 (*)    RBC 3.28 (*)    Hemoglobin 9.9 (*)    HCT 29.5 (*)    RDW 15.9 (*)    Neutro Abs 33.3 (*)    Monocytes Absolute 0.0 (*)    All other components within normal limits  LACTIC ACID, PLASMA  PROTIME-INR  APTT  URINALYSIS, ROUTINE W REFLEX MICROSCOPIC  TROPONIN I (HIGH SENSITIVITY)    EKG EKG Interpretation  Date/Time:  Sunday April 26 2021 19:18:55 EDT Ventricular Rate:  100 PR Interval:  151 QRS Duration: 98 QT Interval:  345 QTC Calculation: 445 R Axis:   77 Text Interpretation: Sinus tachycardia Probable left atrial enlargement Low voltage, precordial leads When compared to prior, similar appearance. No STEMI Confirmed by Antony Blackbird [phone]) on 04/26/2021 10:48:39 PM  Radiology DG Chest Port 1 View  Result Date: 04/26/2021 CLINICAL DATA:  Status post chemo last week for lung cancer. Presents with shortness of breath and rapid heart rate along with right-sided chest pain and dizziness. EXAM: PORTABLE CHEST 1 VIEW COMPARISON:  March 22, 2021 FINDINGS: There is stable right-sided venous Port-A-Cath positioning. Multiple sternal wires and vascular clips are noted. Mild atelectasis and/or scarring is seen within the right lung base. There is a small right pleural  effusion. No pneumothorax is identified. The heart size and mediastinal contours are within normal limits. A radiopaque fusion plate and screws are seen overlying the lower cervical spine. The visualized skeletal structures are otherwise unremarkable. IMPRESSION: Mild right basilar atelectasis and/or scarring with a small right pleural effusion. Electronically Signed   By: Virgina Norfolk M.D.   On: 04/26/2021 18:21    Procedures Procedures   Medications Ordered in ED Medications  ondansetron [facility]) injection 4 mg (4 mg Intravenous Given 04/26/21 1824)  morphine 2 MG/ML injection 2 mg (2 mg Intravenous Given 04/26/21 1826)  lactated ringers bolus 1,000 mL ( Intravenous Stopped 04/26/21 1934)  alum & mag hydroxide-simeth (MAALOX/MYLANTA) 200-200-20 MG/5ML suspension 30 mL (30 mLs Oral Given 04/26/21 2022)    And  lidocaine (XYLOCAINE) 2 % viscous mouth solution 15 mL (  15 mLs Oral Given 04/26/21 2022)  morphine 4 MG/ML injection 4 mg (4 mg Intravenous Given 04/26/21 2032)  lactated ringers bolus 1,000 mL (0 mLs Intravenous Stopped 04/26/21 2132)  pantoprazole (PROTONIX) injection 40 mg (40 mg Intravenous Given 04/26/21 2027)  morphine 4 MG/ML injection 4 mg (4 mg Intravenous Given 04/26/21 2313)  iohexol (OMNIPAQUE) 350 MG/ML injection 100 mL (100 mLs Intravenous Contrast Given 04/27/21 0102)    ED Course  I have reviewed the triage vital signs and the nursing notes.  Pertinent labs & imaging results that were available during my care of the patient were reviewed by me and considered in my medical decision making (see chart for details).    MDM Rules/Calculators/A&P                         65 year old male currently undergoing chemotherapy for neuroendocrine carcinoma of the lung who presents with concern for nausea and rapid heart rate at home.  Differential diagnose includes but limited to post chemotherapeutic nausea with dehydration, electrolyte derangement, pleural effusion, PE, pneumonia,  sepsis, symptomatic anemia.  Tachycardic to the 130s and hypertensive on intake, vital signs otherwise normal.  Cardiopulmonary exam is significant for tachycardia with regular rhythm, mildly diminished breath sounds in the right lung base without rales.  Abdominal exam is benign.  HEENT exam is benign, mucous membranes are moist.  There is no lower extremity edema.  Patient is neurologically intact without focal deficit on exam.  We will proceed with broad work-up and fluid bolus as well as nausea and pain medication.  CBC with severe leukocytosis of 35,000, her patient did receive a dose of Granix 48 hours ago, this is expected at this juncture.  Hemoglobin of 9.9 near patient's baseline.  CMP unremarkable, troponin is normal, lactic acid is normal, coags are normal.  Chest x-ray with mild right basilar atelectasis versus scarring with very small pleural effusion does not seem to have progressed since July.  EKG with sinus tachycardia without overt dysrhythmia.  Patient reevaluated after single fluid bolus of 1 L with significant improvement in his heart rate down to the mid to high 90s.  Remains mildly tachypneic, however is status post right upper lobectomy and states that he feels his breathing is at his baseline.  Patient was reevaluated after administration of nausea and pain medication, continues to have pain and is endorsing heartburn.  Will offer GI cocktail, Protonix, and increased pain medication.  Additionally would like to give patient a second liter bolus and evaluate orthostatics/ambulate him afterwards.  If able to ambulate independently without significant tachycardia, may be discharged home.  Orthostatic on assessment of vital signs. Given 2L bolus, improvement in SOB, tachycardia. No longer tachycardic at rest, however becomes tachycardic to the 130s with ambulation in the room, though he is no longer feeling short of breath even with ambulation.  Extensive discussion with patient  regarding role for CT angiogram to rule out PE given persistent tachycardia with exertion.  Patient voiced understanding of his work-up options and expressed wishes to proceed with CT angiogram.  Do feel patient would benefit from admission to the hospital, however if reassuring CTA and no longer tachycardic with ambulation may be eligible for discharge home.  Patient pending CT angiogram at time of shift change.  Care of this patient signed out to oncoming ED provider Dr. Florina Ou.  All pertinent HPI, physical exam, and laboratory findings were discussed with him prior to my departure.  Please  see his associated note for further insight in the patient's ED disposition.  This chart was dictated using voice recognition software, Dragon. Despite the best efforts of this provider to proofread and correct errors, errors may still occur which can change documentation meaning.  Final Clinical Impression(s) / ED Diagnoses Final diagnoses:  None    Rx / DC Orders ED Discharge Orders     None        Aura Dials 04/27/21 0107    Tegeler, Gwenyth Allegra, MD 04/27/21 1055

## 2021-04-26 NOTE — ED Notes (Signed)
Pt ambulated in room; HR 125, O2 93-97%, no resp distress noted.

## 2021-04-26 NOTE — ED Triage Notes (Signed)
Had Chemo last Wednesday for Lung cancer Today began feeling worse with shortness of breath and rapid heart rate.    Pain across right side of chest around to back.  Mild dizziness A&O x4; pale, skin W&D HR 130

## 2021-04-27 ENCOUNTER — Other Ambulatory Visit (HOSPITAL_COMMUNITY): Payer: Self-pay

## 2021-04-27 ENCOUNTER — Telehealth: Payer: Self-pay | Admitting: Interventional Cardiology

## 2021-04-27 ENCOUNTER — Telehealth: Payer: Self-pay

## 2021-04-27 ENCOUNTER — Emergency Department (HOSPITAL_BASED_OUTPATIENT_CLINIC_OR_DEPARTMENT_OTHER): Payer: Medicare Other

## 2021-04-27 ENCOUNTER — Encounter: Payer: Self-pay | Admitting: Physician Assistant

## 2021-04-27 ENCOUNTER — Encounter: Payer: Self-pay | Admitting: Internal Medicine

## 2021-04-27 ENCOUNTER — Inpatient Hospital Stay: Payer: Medicare Other

## 2021-04-27 MED ORDER — OXYCODONE HCL 5 MG PO TABS: 5.0000 mg | ORAL_TABLET | ORAL | 0 refills | Status: DC | PRN

## 2021-04-27 MED ORDER — OXYCODONE HCL 5 MG PO TABS
10.0000 mg | ORAL_TABLET | Freq: Once | ORAL | Status: AC
  Administered 2021-04-27: 10 mg via ORAL
  Filled 2021-04-27: qty 2

## 2021-04-27 MED ORDER — RIVAROXABAN (XARELTO) VTE STARTER PACK (15 & 20 MG): ORAL_TABLET | ORAL | 0 refills | Status: DC

## 2021-04-27 MED ORDER — IOHEXOL 350 MG/ML SOLN
100.0000 mL | Freq: Once | INTRAVENOUS | Status: AC | PRN
  Administered 2021-04-27: 100 mL via INTRAVENOUS

## 2021-04-27 MED ORDER — RIVAROXABAN (XARELTO) EDUCATION KIT FOR DVT/PE PATIENTS: PACK | Freq: Once | Status: AC

## 2021-04-27 MED ORDER — RIVAROXABAN 15 MG PO TABS
15.0000 mg | ORAL_TABLET | Freq: Once | ORAL | Status: AC
  Administered 2021-04-27: 15 mg via ORAL
  Filled 2021-04-27: qty 1

## 2021-04-27 MED ORDER — RIVAROXABAN (XARELTO) VTE STARTER PACK (15 & 20 MG)
15.0000 mg | ORAL_TABLET | Freq: Two times a day (BID) | ORAL | 0 refills | Status: DC
  Filled 2021-04-27: qty 51, 30d supply, fill #0

## 2021-04-27 NOTE — Telephone Encounter (Signed)
Will have pharmacy team review as Dr Irish Lack is out of office today.

## 2021-04-27 NOTE — ED Provider Notes (Addendum)
I provided a substantive portion of the care of this patient.  I personally performed the entirety of the medical decision making for this encounter.  EKG Interpretation  Date/Time:  Sunday April 26 2021 19:18:55 EDT Ventricular Rate:  100 PR Interval:  151 QRS Duration: 98 QT Interval:  345 QTC Calculation: 445 R Axis:   77 Text Interpretation: Sinus tachycardia Probable left atrial enlargement Low voltage, precordial leads When compared to prior, similar appearance. No STEMI Confirmed by Antony Blackbird [phone]) on 04/26/2021 10:48:39 PM    Nursing notes and vitals signs, including pulse oximetry, reviewed.  Summary of this visit's results, reviewed by myself:  EKG:  EKG Interpretation  Date/Time:  Sunday April 26 2021 19:18:55 EDT Ventricular Rate:  100 PR Interval:  151 QRS Duration: 98 QT Interval:  345 QTC Calculation: 445 R Axis:   77 Text Interpretation: Sinus tachycardia Probable left atrial enlargement Low voltage, precordial leads When compared to prior, similar appearance. No STEMI Confirmed by Antony Blackbird [phone]) on 04/26/2021 10:48:39 PM        Labs:  Results for orders placed or performed during the hospital encounter of 04/26/21 (from the past 24 hour(s))  Lactic acid, plasma     Status: None   Collection Time: 04/26/21  6:29 PM  Result Value Ref Range   Lactic Acid, Venous 1.7 0.5 - 1.9 mmol/L  Comprehensive metabolic panel     Status: Abnormal   Collection Time: 04/26/21  6:29 PM  Result Value Ref Range   Sodium 136 135 - 145 mmol/L   Potassium 4.0 3.5 - 5.1 mmol/L   Chloride 100 98 - 111 mmol/L   CO2 27 22 - 32 mmol/L   Glucose, Bld 88 70 - 99 mg/dL   BUN 26 (H) 8 - 23 mg/dL   Creatinine, Ser 1.03 0.61 - 1.24 mg/dL   Calcium 8.9 8.9 - 10.3 mg/dL   Total Protein 6.5 6.5 - 8.1 g/dL   Albumin 3.4 (L) 3.5 - 5.0 g/dL   AST 18 15 - 41 U/L   ALT 14 0 - 44 U/L   Alkaline Phosphatase 106 38 - 126 U/L   Total Bilirubin 0.3 0.3 - 1.2 mg/dL   GFR,  Estimated >60 >60 mL/min   Anion gap 9 5 - 15  CBC WITH DIFFERENTIAL     Status: Abnormal   Collection Time: 04/26/21  6:29 PM  Result Value Ref Range   WBC 35.4 (H) 4.0 - 10.5 K/uL   RBC 3.28 (L) 4.22 - 5.81 MIL/uL   Hemoglobin 9.9 (L) 13.0 - 17.0 g/dL   HCT 29.5 (L) 39.0 - 52.0 %   MCV 89.9 80.0 - 100.0 fL   MCH 30.2 26.0 - 34.0 pg   MCHC 33.6 30.0 - 36.0 g/dL   RDW 15.9 (H) 11.5 - 15.5 %   Platelets 235 150 - 400 K/uL   nRBC 0.0 0.0 - 0.2 %   Neutrophils Relative % 94 %   Neutro Abs 33.3 (H) 1.7 - 7.7 K/uL   Lymphocytes Relative 6 %   Lymphs Abs 2.1 0.7 - 4.0 K/uL   Monocytes Relative 0 %   Monocytes Absolute 0.0 (L) 0.1 - 1.0 K/uL   Eosinophils Relative 0 %   Eosinophils Absolute 0.0 0.0 - 0.5 K/uL   Basophils Relative 0 %   Basophils Absolute 0.0 0.0 - 0.1 K/uL   WBC Morphology MORPHOLOGY UNREMARKABLE    RBC Morphology MORPHOLOGY UNREMARKABLE    Smear Review PLATELET CLUMPS NOTED  ON SMEAR, UNABLE TO ESTIMATE    Abs Immature Granulocytes 0.00 0.00 - 0.07 K/uL  Protime-INR     Status: None   Collection Time: 04/26/21  6:29 PM  Result Value Ref Range   Prothrombin Time 12.8 11.4 - 15.2 seconds   INR 1.0 0.8 - 1.2  APTT     Status: None   Collection Time: 04/26/21  6:29 PM  Result Value Ref Range   aPTT 33 24 - 36 seconds  Troponin I (High Sensitivity)     Status: None   Collection Time: 04/26/21  6:29 PM  Result Value Ref Range   Troponin I (High Sensitivity) 4 <18 ng/L  Urinalysis, Routine w reflex microscopic Urine, Clean Catch     Status: None   Collection Time: 04/26/21  8:53 PM  Result Value Ref Range   Color, Urine YELLOW YELLOW   APPearance CLEAR CLEAR   Specific Gravity, Urine 1.015 1.005 - 1.030   pH 7.0 5.0 - 8.0   Glucose, UA NEGATIVE NEGATIVE mg/dL   Hgb urine dipstick NEGATIVE NEGATIVE   Bilirubin Urine NEGATIVE NEGATIVE   Ketones, ur NEGATIVE NEGATIVE mg/dL   Protein, ur NEGATIVE NEGATIVE mg/dL   Nitrite NEGATIVE NEGATIVE   Leukocytes,Ua NEGATIVE  NEGATIVE    Imaging Studies: CT Angio Chest PE W/Cm &/Or Wo Cm  Result Date: 04/27/2021 CLINICAL DATA:  Concern for pulmonary embolism. EXAM: CT ANGIOGRAPHY CHEST WITH CONTRAST TECHNIQUE: Multidetector CT imaging of the chest was performed using the standard protocol during bolus administration of intravenous contrast. Multiplanar CT image reconstructions and MIPs were obtained to evaluate the vascular anatomy. CONTRAST:  110mL OMNIPAQUE IOHEXOL 350 MG/ML SOLN COMPARISON:  Chest CT dated 02/28/2021. FINDINGS: Cardiovascular: There is no cardiomegaly or pericardial effusion. 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 There is mild atherosclerotic calcification of the thoracic aorta. No aneurysmal dilatation or dissection. The origins of the great vessels of the aortic arch appear patent as visualized. Evaluation of the pulmonary arteries is limited due to respiratory motion artifact. There is linear nonocclusive thrombus within the lobar branches of the left lower lobe and lingula extending into the segmental pulmonary artery branches, age indeterminate. Correlation with history of recent PE recommended. Nonocclusive thrombus is also noted in the segmental branch of the right lower lobe (121/7). These findings however are new since the prior CT of 02/28/2021. Mediastinum/Nodes: No hilar or mediastinal adenopathy. The esophagus is grossly unremarkable. No mediastinal fluid collection. Lungs/Pleura: Similar appearance of small right pleural effusion with partial compressive atelectasis of the right lower lobe. Pneumonia is not excluded clinical correlation is recommended. Mild thickening of the bronchial wall of the right lower lobe. No focal consolidation or pneumothorax. The central airways remain patent. Postsurgical changes of the right lung as seen previously. Upper Abdomen: No acute abnormality. Musculoskeletal: Multilevel degenerative changes with anterior osteophyte/spurring. C7-T1  ACDF. Median sternotomy wires. No acute osseous pathology. Old healed right lateral rib fracture. Review of the MIP images confirms the above findings. IMPRESSION: 1. Nonocclusive pulmonary emboli within the lobar branches of the left lower lobe and lingula extending into the segmental pulmonary artery branches, age indeterminate, but new since the prior CT. Nonocclusive thrombus is also noted in the segmental branch of the right lower lobe. No CT evidence of right heart straining. 2. Similar appearance of small right pleural effusion with partial compressive atelectasis of the right lower lobe. Pneumonia is not excluded clinical correlation is recommended. 3. Aortic Atherosclerosis (ICD10-I70.0). These results were called by telephone  at the time of interpretation on 04/27/2021 at 1:52 am to Dr. Florina Ou, who verbally acknowledged these results. Electronically Signed   By: Anner Crete M.D.   On: 04/27/2021 02:02   DG Chest Port 1 View  Result Date: 04/26/2021 CLINICAL DATA:  Status post chemo last week for lung cancer. Presents with shortness of breath and rapid heart rate along with right-sided chest pain and dizziness. EXAM: PORTABLE CHEST 1 VIEW COMPARISON:  March 22, 2021 FINDINGS: There is stable right-sided venous Port-A-Cath positioning. Multiple sternal wires and vascular clips are noted. Mild atelectasis and/or scarring is seen within the right lung base. There is a small right pleural effusion. No pneumothorax is identified. The heart size and mediastinal contours are within normal limits. A radiopaque fusion plate and screws are seen overlying the lower cervical spine. The visualized skeletal structures are otherwise unremarkable. IMPRESSION: Mild right basilar atelectasis and/or scarring with a small right pleural effusion. Electronically Signed   By: Virgina Norfolk M.D.   On: 04/26/2021 18:21    2:07 AM The patient's vital signs are within normal limits.  He is no longer tachycardic and his  oxygen saturation is 97% on room air.  He is still having pain at his surgical site.  His CT scan does show some small, nonocclusive pulmonary emboli which are not causing right heart strain.  I believe he is stable to be discharged home on Xarelto and will contact Dr. [name] regarding follow-up.     Ahmet Schank, Jenny Reichmann, MD 04/27/21 6010    Shanon Rosser, MD 04/27/21 [phone]

## 2021-04-27 NOTE — ED Notes (Signed)
Rad tech communicated with RN Eulogio Ditch via [employer]. RN deemed patient able to travel to [facility] without RN accompanying [facility] patient.

## 2021-04-27 NOTE — Discharge Instructions (Addendum)
Stop taking your Plavix (clopidogrel) until you have discussed this with Dr. [name].  I have sent Dr. [name] a note advising he would likely hear from you today.

## 2021-04-27 NOTE — Telephone Encounter (Signed)
Pt c/o medication issue:  1. Name of Medication: clopidogrel (PLAVIX) 75 MG tablet  2. How are you currently taking this medication (dosage and times per day)? 1 tablet by mouth every day  3. Are you having a reaction (difficulty breathing--STAT)? no  4. What is your medication issue? Patient was put on RIVAROXABAN (XARELTO) VTE STARTER PACK (15 & 20 MG) last night by the ER dr. The dr wanted the patient to call in to see if Dr. Irish Lack would want him on both medication. Please advise

## 2021-04-27 NOTE — Telephone Encounter (Signed)
Pt called stating he was in the ER untilk 3am and was found to have some small blood clots in his lungs. Pt states he was prescribed Eliquis and wants to know if he should d/c Plavix. Pt wants to know if he should clarify this with Oncology or his Cardiologist.  I have called the pt back and advised the pt to please contact his Cardiologist to determine if he should d/c Plavix.

## 2021-04-28 ENCOUNTER — Encounter (HOSPITAL_BASED_OUTPATIENT_CLINIC_OR_DEPARTMENT_OTHER): Payer: Self-pay | Admitting: *Deleted

## 2021-04-28 ENCOUNTER — Emergency Department (HOSPITAL_BASED_OUTPATIENT_CLINIC_OR_DEPARTMENT_OTHER): Payer: Medicare Other

## 2021-04-28 ENCOUNTER — Telehealth: Payer: Self-pay | Admitting: Interventional Cardiology

## 2021-04-28 ENCOUNTER — Inpatient Hospital Stay (HOSPITAL_BASED_OUTPATIENT_CLINIC_OR_DEPARTMENT_OTHER)
Admission: EM | Admit: 2021-04-28 | Discharge: 2021-04-30 | DRG: 809 | Disposition: A | Discharge status: Home / Self Care | Payer: Medicare Other | Attending: Student | Admitting: Student

## 2021-04-28 ENCOUNTER — Other Ambulatory Visit: Payer: Self-pay

## 2021-04-28 DIAGNOSIS — D649 Anemia, unspecified: Secondary | ICD-10-CM

## 2021-04-28 DIAGNOSIS — I959 Hypotension, unspecified: Secondary | ICD-10-CM | POA: Diagnosis present

## 2021-04-28 DIAGNOSIS — E860 Dehydration: Secondary | ICD-10-CM | POA: Diagnosis not present

## 2021-04-28 DIAGNOSIS — R6510 Systemic inflammatory response syndrome (SIRS) of non-infectious origin without acute organ dysfunction: Secondary | ICD-10-CM | POA: Diagnosis present

## 2021-04-28 DIAGNOSIS — Z7982 Long term (current) use of aspirin: Secondary | ICD-10-CM

## 2021-04-28 DIAGNOSIS — Z7901 Long term (current) use of anticoagulants: Secondary | ICD-10-CM | POA: Diagnosis not present

## 2021-04-28 DIAGNOSIS — Z85828 Personal history of other malignant neoplasm of skin: Secondary | ICD-10-CM

## 2021-04-28 DIAGNOSIS — Z888 Allergy status to other drugs, medicaments and biological substances status: Secondary | ICD-10-CM

## 2021-04-28 DIAGNOSIS — Z981 Arthrodesis status: Secondary | ICD-10-CM

## 2021-04-28 DIAGNOSIS — Z902 Acquired absence of lung [part of]: Secondary | ICD-10-CM | POA: Diagnosis not present

## 2021-04-28 DIAGNOSIS — Z951 Presence of aortocoronary bypass graft: Secondary | ICD-10-CM | POA: Diagnosis not present

## 2021-04-28 DIAGNOSIS — Z87891 Personal history of nicotine dependence: Secondary | ICD-10-CM | POA: Diagnosis not present

## 2021-04-28 DIAGNOSIS — Z823 Family history of stroke: Secondary | ICD-10-CM

## 2021-04-28 DIAGNOSIS — Z7902 Long term (current) use of antithrombotics/antiplatelets: Secondary | ICD-10-CM

## 2021-04-28 DIAGNOSIS — N183 Chronic kidney disease, stage 3 unspecified: Secondary | ICD-10-CM | POA: Diagnosis present

## 2021-04-28 DIAGNOSIS — N179 Acute kidney failure, unspecified: Secondary | ICD-10-CM | POA: Diagnosis present

## 2021-04-28 DIAGNOSIS — I480 Paroxysmal atrial fibrillation: Secondary | ICD-10-CM | POA: Diagnosis not present

## 2021-04-28 DIAGNOSIS — Z801 Family history of malignant neoplasm of trachea, bronchus and lung: Secondary | ICD-10-CM

## 2021-04-28 DIAGNOSIS — R112 Nausea with vomiting, unspecified: Secondary | ICD-10-CM | POA: Diagnosis present

## 2021-04-28 DIAGNOSIS — C3411 Malignant neoplasm of upper lobe, right bronchus or lung: Secondary | ICD-10-CM | POA: Diagnosis present

## 2021-04-28 DIAGNOSIS — R55 Syncope and collapse: Secondary | ICD-10-CM | POA: Diagnosis present

## 2021-04-28 DIAGNOSIS — Z8673 Personal history of transient ischemic attack (TIA), and cerebral infarction without residual deficits: Secondary | ICD-10-CM | POA: Diagnosis not present

## 2021-04-28 DIAGNOSIS — Z8249 Family history of ischemic heart disease and other diseases of the circulatory system: Secondary | ICD-10-CM

## 2021-04-28 DIAGNOSIS — C7931 Secondary malignant neoplasm of brain: Secondary | ICD-10-CM | POA: Diagnosis present

## 2021-04-28 DIAGNOSIS — I2510 Atherosclerotic heart disease of native coronary artery without angina pectoris: Secondary | ICD-10-CM | POA: Diagnosis present

## 2021-04-28 DIAGNOSIS — Z20822 Contact with and (suspected) exposure to covid-19: Secondary | ICD-10-CM | POA: Diagnosis not present

## 2021-04-28 DIAGNOSIS — D61810 Antineoplastic chemotherapy induced pancytopenia: Secondary | ICD-10-CM | POA: Diagnosis not present

## 2021-04-28 DIAGNOSIS — I129 Hypertensive chronic kidney disease with stage 1 through stage 4 chronic kidney disease, or unspecified chronic kidney disease: Secondary | ICD-10-CM | POA: Diagnosis present

## 2021-04-28 DIAGNOSIS — Z79899 Other long term (current) drug therapy: Secondary | ICD-10-CM

## 2021-04-28 DIAGNOSIS — I9589 Other hypotension: Secondary | ICD-10-CM | POA: Diagnosis present

## 2021-04-28 DIAGNOSIS — Z86711 Personal history of pulmonary embolism: Secondary | ICD-10-CM

## 2021-04-28 DIAGNOSIS — I252 Old myocardial infarction: Secondary | ICD-10-CM | POA: Diagnosis not present

## 2021-04-28 DIAGNOSIS — I255 Ischemic cardiomyopathy: Secondary | ICD-10-CM | POA: Diagnosis present

## 2021-04-28 DIAGNOSIS — Z955 Presence of coronary angioplasty implant and graft: Secondary | ICD-10-CM

## 2021-04-28 DIAGNOSIS — A419 Sepsis, unspecified organism: Secondary | ICD-10-CM

## 2021-04-28 DIAGNOSIS — T451X5A Adverse effect of antineoplastic and immunosuppressive drugs, initial encounter: Secondary | ICD-10-CM | POA: Diagnosis not present

## 2021-04-28 DIAGNOSIS — R000 Tachycardia, unspecified: Secondary | ICD-10-CM

## 2021-04-28 DIAGNOSIS — Z887 Allergy status to serum and vaccine status: Secondary | ICD-10-CM

## 2021-04-28 DIAGNOSIS — E861 Hypovolemia: Secondary | ICD-10-CM | POA: Diagnosis present

## 2021-04-28 DIAGNOSIS — N1831 Chronic kidney disease, stage 3a: Secondary | ICD-10-CM | POA: Diagnosis present

## 2021-04-28 DIAGNOSIS — C3490 Malignant neoplasm of unspecified part of unspecified bronchus or lung: Secondary | ICD-10-CM

## 2021-04-28 LAB — CBC WITH DIFFERENTIAL/PLATELET
Abs Immature Granulocytes: 0.08 10*3/uL — ABNORMAL HIGH (ref 0.00–0.07)
Basophils Absolute: 0.1 10*3/uL (ref 0.0–0.1)
Basophils Relative: 1 %
Eosinophils Absolute: 0.1 10*3/uL (ref 0.0–0.5)
Eosinophils Relative: 2 %
HCT: 29.8 % — ABNORMAL LOW (ref 39.0–52.0)
Hemoglobin: 9.6 g/dL — ABNORMAL LOW (ref 13.0–17.0)
Immature Granulocytes: 2 %
Lymphocytes Relative: 35 %
Lymphs Abs: 1.8 10*3/uL (ref 0.7–4.0)
MCH: 29.4 pg (ref 26.0–34.0)
MCHC: 32.2 g/dL (ref 30.0–36.0)
MCV: 91.4 fL (ref 80.0–100.0)
Monocytes Absolute: 0.4 10*3/uL (ref 0.1–1.0)
Monocytes Relative: 7 %
Neutro Abs: 2.8 10*3/uL (ref 1.7–7.7)
Neutrophils Relative %: 53 %
Platelets: 174 10*3/uL (ref 150–400)
RBC: 3.26 MIL/uL — ABNORMAL LOW (ref 4.22–5.81)
RDW: 15.6 % — ABNORMAL HIGH (ref 11.5–15.5)
WBC: 5.2 10*3/uL (ref 4.0–10.5)
nRBC: 0 % (ref 0.0–0.2)

## 2021-04-28 LAB — COMPREHENSIVE METABOLIC PANEL
ALT: 14 U/L (ref 0–44)
AST: 17 U/L (ref 15–41)
Albumin: 3.9 g/dL (ref 3.5–5.0)
Alkaline Phosphatase: 125 U/L (ref 38–126)
Anion gap: 12 (ref 5–15)
BUN: 23 mg/dL (ref 8–23)
CO2: 24 mmol/L (ref 22–32)
Calcium: 9.4 mg/dL (ref 8.9–10.3)
Chloride: 101 mmol/L (ref 98–111)
Creatinine, Ser: 1.26 mg/dL — ABNORMAL HIGH (ref 0.61–1.24)
GFR, Estimated: >60 mL/min (ref >60)
Glucose, Bld: 111 mg/dL — ABNORMAL HIGH (ref 70–99)
Potassium: 4 mmol/L (ref 3.5–5.1)
Sodium: 137 mmol/L (ref 135–145)
Total Bilirubin: 0.7 mg/dL (ref 0.3–1.2)
Total Protein: 7.4 g/dL (ref 6.5–8.1)

## 2021-04-28 LAB — RESP PANEL BY RT-PCR (FLU A&B, COVID) ARPGX2
Influenza A by PCR: NEGATIVE
Influenza B by PCR: NEGATIVE
SARS Coronavirus 2 by RT PCR: NEGATIVE

## 2021-04-28 LAB — TROPONIN I (HIGH SENSITIVITY)
Troponin I (High Sensitivity): 4 ng/L (ref <18)
Troponin I (High Sensitivity): 4 ng/L (ref <18)

## 2021-04-28 LAB — LIPASE, BLOOD: Lipase: 19 U/L (ref 11–51)

## 2021-04-28 LAB — LACTIC ACID, PLASMA
Lactic Acid, Venous: 3.6 mmol/L (ref 0.5–1.9)
Lactic Acid, Venous: 2.5 mmol/L (ref 0.5–1.9)

## 2021-04-28 LAB — PROTIME-INR
INR: 2.1 — ABNORMAL HIGH (ref 0.8–1.2)
Prothrombin Time: 23.8 seconds — ABNORMAL HIGH (ref 11.4–15.2)

## 2021-04-28 LAB — BRAIN NATRIURETIC PEPTIDE: B Natriuretic Peptide: 95.2 pg/mL (ref 0.0–100.0)

## 2021-04-28 MED ORDER — SODIUM CHLORIDE 0.9 % IV SOLN
2.0000 g | Freq: Once | INTRAVENOUS | Status: AC
  Administered 2021-04-28: 2 g via INTRAVENOUS
  Filled 2021-04-28: qty 2

## 2021-04-28 MED ORDER — OXYCODONE HCL 5 MG PO TABS
10.0000 mg | ORAL_TABLET | ORAL | Status: DC | PRN
  Administered 2021-04-28 – 2021-04-29 (×2): 10 mg via ORAL
  Filled 2021-04-28 (×2): qty 2

## 2021-04-28 MED ORDER — VANCOMYCIN HCL IN DEXTROSE 1-5 GM/200ML-% IV SOLN
1000.0000 mg | Freq: Once | INTRAVENOUS | Status: AC
  Administered 2021-04-28: 1000 mg via INTRAVENOUS
  Filled 2021-04-28: qty 200

## 2021-04-28 MED ORDER — CHLORHEXIDINE GLUCONATE CLOTH 2 % EX PADS
6.0000 | MEDICATED_PAD | Freq: Every day | CUTANEOUS | Status: DC
  Administered 2021-04-28: 6 via TOPICAL

## 2021-04-28 MED ORDER — FENTANYL CITRATE (PF) 100 MCG/2ML IJ SOLN
50.0000 ug | Freq: Once | INTRAMUSCULAR | Status: AC
  Administered 2021-04-28: 50 ug via INTRAVENOUS
  Filled 2021-04-28: qty 2

## 2021-04-28 MED ORDER — SODIUM CHLORIDE 0.9 % IV SOLN
2.0000 g | Freq: Three times a day (TID) | INTRAVENOUS | Status: DC
  Administered 2021-04-29 (×2): 2 g via INTRAVENOUS
  Filled 2021-04-28 (×2): qty 2

## 2021-04-28 MED ORDER — VANCOMYCIN HCL 1500 MG/300ML IV SOLN
1500.0000 mg | INTRAVENOUS | Status: DC
  Filled 2021-04-28: qty 300

## 2021-04-28 MED ORDER — SODIUM CHLORIDE 0.9 % IV BOLUS (SEPSIS)
1400.0000 mL | Freq: Once | INTRAVENOUS | Status: AC
  Administered 2021-04-28: 1400 mL via INTRAVENOUS

## 2021-04-28 MED ORDER — METRONIDAZOLE 500 MG/100ML IV SOLN
500.0000 mg | Freq: Once | INTRAVENOUS | Status: AC
  Administered 2021-04-28: 500 mg via INTRAVENOUS
  Filled 2021-04-28: qty 100

## 2021-04-28 MED ORDER — SODIUM CHLORIDE 0.9 % IV BOLUS
1000.0000 mL | Freq: Once | INTRAVENOUS | Status: AC
  Administered 2021-04-28: 1000 mL via INTRAVENOUS

## 2021-04-28 MED ORDER — ONDANSETRON HCL 4 MG/2ML IJ SOLN
4.0000 mg | Freq: Once | INTRAMUSCULAR | Status: AC
  Administered 2021-04-28: 4 mg via INTRAVENOUS
  Filled 2021-04-28: qty 2

## 2021-04-28 NOTE — Telephone Encounter (Signed)
I returned call to patient and he is currently being evaluated in ED

## 2021-04-28 NOTE — Progress Notes (Addendum)
Pharmacy Antibiotic Note  [name] is a 65 y.o. male admitted on 04/28/2021 with  unknown infection .  Pharmacy has been consulted for vancomycin and cefepime dosing.  Plan: Vancomycin 1000mg  x1 then 1500mg  IV q24h (eAUC 502, Cr 1.26mg /dL) Cefepime 2g IV q8h -Monitor renal function, clinical status, and antibiotic plan -Order vanc levels as necessary  Height: 5\' 9"  (175.3 cm) Weight: 79.4 kg (175 lb) IBW/kg (Calculated) : 70.7  Temp (24hrs), Avg:98.8 F (37.1 C), Min:98.8 F (37.1 C), Max:98.8 F (37.1 C)  Recent Labs  Lab 04/26/21 1829 04/28/21 1751  WBC 35.4* 5.2  CREATININE 1.03 1.26*  LATICACIDVEN 1.7 3.6*    Estimated Creatinine Clearance: 57.7 mL/min (A) (by C-G formula based on SCr of 1.26 mg/dL (H)).    Allergies  Allergen Reactions   Prednisone Other (See Comments)    States that this med makes him "crazy"   Tetanus Toxoids Swelling and Other (See Comments)    Fever, Swelling of the arm    Wellbutrin [Bupropion] Other (See Comments)    Crazy thoughts, nightmares   Indomethacin Other (See Comments)    rectal bleeding   Other     Other reaction(s): Unknown   Varenicline Other (See Comments)    Dreams Other reaction(s): Unknown Other reaction(s): Unknown    Antimicrobials this admission: Vanc 8/30 >>  Cefepime 8/30 >>   Dose adjustments this admission: N/A  Microbiology results: 8/30 BCx:   Thank you for allowing pharmacy to be a part of this patient's care.  Joetta Manners, PharmD, [facility] Emergency Medicine Clinical Pharmacist ED RPh Phone: [place]: [phone]

## 2021-04-28 NOTE — ED Provider Notes (Signed)
[facility] EMERGENCY DEPARTMENT Provider Note   CSN: [id number] Arrival date & time: 04/28/21  1734     History Chief Complaint  Patient presents with   Palpitations    [name] is a 65 y.o. male.  He has a history of coronary disease, lung cancer status postresection, active chemotherapy and was here 2 days ago and diagnosed with PE.  Now on blood thinners.  He said he is felt very weak, not sure if it is the chemo.  Has not had much of an appetite.  Today was doing mild exertional activity cleaning up after his dog and felt weak and short of breath and checked his pulse rate and it was 150.  Has not noticed any blood in his bowel movements or melena or hematuria.  No fevers or chills.  Minimal cough productive of some sputum.  Pain across his lower chest that is been there for the last few days.  The history is provided by the patient.  Shortness of Breath Severity:  Moderate Onset quality:  Gradual Duration:  4 days Timing:  Intermittent Progression:  Worsening Chronicity:  New Context: activity   Relieved by:  Nothing Worsened by:  Activity Ineffective treatments:  Rest Associated symptoms: chest pain and cough   Associated symptoms: no abdominal pain, no fever, no headaches, no hemoptysis, no neck pain, no rash, no sore throat, no sputum production, no vomiting and no wheezing   Risk factors: hx of cancer and hx of PE/DVT       Past Medical History:  Diagnosis Date   Anxiety    Arthritis    Basal cell carcinoma (BCC) of forehead    CAD (coronary artery disease)    a. 10/2015 ant STEMI >> LHC with 3 v CAD; oLAD tx with POBA >> emergent CABG. b. Multiple evals since that time, early graft failure of SVG-RCA by cath 03/2016. c. 2/19 PCI/DES x1 to pRCA, normal EF.   Carotid artery disease ([place])    a. 40-59% BICA 02/2018.   Depression    Dyspnea    Ectopic atrial tachycardia (HCC)    Esophageal reflux    eosinophil esophagitis   Family history of  adverse reaction to anesthesia    "sister has PONV" (06/21/2017)   Former tobacco use    Gout    Hepatitis C    "treated and cured" (06/21/2017)   High cholesterol    History of kidney stones    Hypertension    Ischemic cardiomyopathy    a. EF 25-30% at intraop TEE 4/17  //  b. Limited Echo 5/17 - EF 45-50%, mild ant HK. c. EF 55-65% by cath 09/2017.   Migraine    "3-4/yr" (06/21/2017)   Myocardial infarction ([place]) 10/2015   Palpitations    Sinus bradycardia    a. HR dropping into 40s in 02/2016 -> BB reduced.   Stroke [facility]) 10/2016   "small one; sometimes my memory/cognitive issues" (06/21/2017)   Symptomatic hypotension    a. 02/2016 ER visit -> meds reduced.   Syncope    Wears dentures    Wears glasses     Patient Active Problem List   Diagnosis Date Noted   Hypomagnesemia 04/08/2021   Port-A-Cath in place 04/07/2021   Protein-calorie malnutrition, severe 03/25/2021   CAP (community acquired pneumonia) 03/23/2021   PNA (pneumonia) 03/22/2021   Community acquired pneumonia 03/22/2021   CKD (chronic kidney disease), stage III ([place]) 03/22/2021   Hypoxia 02/28/2021   Dyspnea 02/28/2021  AKI (acute kidney injury) ([place]) 02/25/2021   AF (paroxysmal atrial fibrillation) ([place]) 02/25/2021   Pleural effusion 02/25/2021   Neutropenia, drug-induced ([place]) 02/24/2021   Positive blood cultures 02/17/2021   Positive blood culture 02/16/2021   Right flank pain 02/09/2021   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[place]) 01/30/2021   Encounter for antineoplastic chemotherapy 01/29/2021   Goals of care, counseling/discussion 01/14/2021   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   Acute back pain with sciatica 01/12/2021   Allergic rhinitis 01/12/2021   Amnesia 01/12/2021   Kidney stone 01/12/2021   Sciatica 01/12/2021   Bipolar disorder ([place]) 01/12/2021   S/P lobectomy of lung 12/24/2020   Solitary lung  nodule 11/28/2020   Chest pain 11/07/2019   Gastroesophageal reflux disease    Cervical radiculopathy 10/17/2018   Preoperative clearance 10/04/2018   Palpitations    Coronary artery disease involving coronary bypass graft of native heart with angina pectoris ([place])    Transient loss of consciousness 03/24/2018   Ectopic atrial tachycardia ([place]) 02/09/2018   Central chest pain 03/10/2017   Family hx-stroke 11/10/2016   Stroke-like episode ([place]) - R brain, s/p tPA 11/09/2016   Unstable angina ([place]) 09/07/2016   Claudication of both lower extremities ([place])    Pure hypercholesterolemia    Tobacco abuse disorder    CAD of autologous artery bypass graft without angina    Chest pain at rest 06/10/2016   Abnormal nuclear stress test - HIGH RISK 04/20/2016   Old MI (myocardial infarction)    Essential hypertension 02/26/2016   Ischemic cardiomyopathy 12/25/2015   Hyperlipidemia LDL goal <70 12/25/2015   Mild tobacco abuse in early remission 11/28/2015   Coronary artery disease involving native coronary artery of native heart with angina pectoris ([place]) 11/28/2015   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   Acute MI anterior wall first episode care [facility])    Precordial chest pain 03/07/2015   Gout attack 03/07/2015   Mixed bipolar I disorder ([place]) 03/07/2015   Fibromyalgia 07/09/2014   Gout 07/09/2014   Anxiety 07/09/2014   Depression 07/09/2014   Hepatitis C 07/68/0881   Eosinophilic esophagitis 06/29/5944    Past Surgical History:  Procedure Laterality Date   25 GAUGE PARS PLANA VITRECTOMY WITH 20 GAUGE MVR PORT  12/31/2020   Procedure: 25 GAUGE PARS PLANA VITRECTOMY WITH 20 GAUGE MVR PORT;  Surgeon: [name], MD;  [facility]: [facility] OR;  Service: Thoracic;;   ANTERIOR CERVICAL DECOMP/DISCECTOMY FUSION N/A 10/17/2018   Procedure: Anterior Cervical Decompression Fusion - Cervical seven -Thoracic one;  Surgeon: Consuella Lose, MD;  Location: [facility];  Service: Neurosurgery;  Laterality: N/A;    BASAL CELL CARCINOMA EXCISION     "forehead   BIOPSY  07/20/2019   Procedure: BIOPSY;  Surgeon: Carol Ada, MD;  Location: [facility];  Service: Endoscopy;;   BRONCHIAL BIOPSY  12/24/2020   Procedure: BRONCHIAL BIOPSIES;  Surgeon: Garner Nash, DO;  Location: [place] ENDOSCOPY;  Service: Pulmonary;;   BRONCHIAL BRUSHINGS  12/24/2020   Procedure: BRONCHIAL BRUSHINGS;  Surgeon: Garner Nash, DO;  Location: [place];  Service: Pulmonary;;   BRONCHIAL NEEDLE ASPIRATION BIOPSY  12/24/2020   Procedure: BRONCHIAL NEEDLE ASPIRATION BIOPSIES;  Surgeon: Garner Nash, DO;  Location: [place];  Service: Pulmonary;;   CARDIAC CATHETERIZATION N/A 11/28/2015   Procedure: Left Heart Cath and Coronary Angiography;  Surgeon: [name], MD;  Location: [place] CV LAB;  Service: Cardiovascular;  Laterality: N/A;   CARDIAC CATHETERIZATION N/A 11/28/2015   Procedure: Coronary Balloon Angioplasty;  Surgeon: [name], MD;  Location: [place] CV LAB;  Service: Cardiovascular;  Laterality: N/A;  ostial LAD   CARDIAC CATHETERIZATION N/A 11/28/2015   Procedure: Coronary/Graft Angiography;  Surgeon: [name], MD;  Location: [place] CV LAB;  Service: Cardiovascular;  Laterality: N/A;  coronaries only    CARDIAC CATHETERIZATION N/A 04/21/2016   Procedure: Left Heart Cath and Coronary Angiography;  Surgeon: Wellington Hampshire, MD;  Location: [place] CV LAB;  Service: Cardiovascular;  Laterality: N/A;   CARDIAC CATHETERIZATION N/A 06/14/2016   Procedure: Left Heart Cath and Cors/Grafts Angiography;  Surgeon: Lorretta Harp, MD;  Location: [place] CV LAB;  Service: Cardiovascular;  Laterality: N/A;   CARDIAC CATHETERIZATION N/A 09/08/2016   Procedure: Left Heart Cath and Cors/Grafts Angiography;  Surgeon: Wellington Hampshire, MD;  Location: [place] CV LAB;  Service: Cardiovascular;  Laterality: N/A;   CARDIAC CATHETERIZATION     CORONARY ARTERY BYPASS GRAFT N/A  11/28/2015   Procedure: CORONARY ARTERY BYPASS GRAFTING (CABG) TIMES FIVE USING LEFT INTERNAL MAMMARY ARTERY AND RIGHT GREATER SAPHENOUS,VIEN HARVEATED BY ENDOVIEN, INTRAOPPRATIVE TEE;  Surgeon: Gaye Pollack, MD;  Location: [facility];  Service: Open Heart Surgery;  Laterality: N/A;   CORONARY STENT INTERVENTION N/A 10/05/2017   Procedure: CORONARY STENT INTERVENTION;  Surgeon: [name], MD;  Location: [place] CV LAB;  Service: Cardiovascular;  Laterality: N/A;   ESOPHAGOGASTRODUODENOSCOPY (EGD) WITH PROPOFOL N/A 07/20/2019   Procedure: ESOPHAGOGASTRODUODENOSCOPY (EGD) WITH PROPOFOL;  Surgeon: Carol Ada, MD;  [facility];  Service: Endoscopy;  Laterality: N/A;   ESOPHAGOGASTRODUODENOSCOPY (EGD) WITH PROPOFOL N/A 01/30/2021   Procedure: ESOPHAGOGASTRODUODENOSCOPY (EGD) WITH PROPOFOL;  Surgeon: Carol Ada, MD;  [facility];  Service: Endoscopy;  Laterality: N/A;   FIDUCIAL MARKER PLACEMENT  12/24/2020   Procedure: FIDUCIAL DYE MARKER PLACEMENT;  Surgeon: Garner Nash, DO;  Location: [place] ENDOSCOPY;  Service: Pulmonary;;   HUMERUS SURGERY Right 1969   "tumor inside bone; filled it w/bone chips"   INTERCOSTAL NERVE BLOCK Right 12/24/2020   Procedure: INTERCOSTAL NERVE BLOCK;  Surgeon: [name], MD;  Location: [facility];  Service: Thoracic;  Laterality: Right;   IR IMAGING GUIDED PORT INSERTION  02/06/2021   LEFT HEART CATH AND CORS/GRAFTS ANGIOGRAPHY N/A 03/11/2017   Procedure: Left Heart Cath and Cors/Grafts Angiography;  Surgeon: Leonie Man, MD;  Location: [place] CV LAB;  Service: Cardiovascular;  Laterality: N/A;   LEFT HEART CATH AND CORS/GRAFTS ANGIOGRAPHY N/A 10/05/2017   Procedure: LEFT HEART CATH AND CORS/GRAFTS ANGIOGRAPHY;  Surgeon: [name], MD;  Location: [place] CV LAB;  Service: Cardiovascular;  Laterality: N/A;   LEFT HEART CATH AND CORS/GRAFTS ANGIOGRAPHY N/A 04/11/2019   Procedure: LEFT HEART CATH AND CORS/GRAFTS  ANGIOGRAPHY;  Surgeon: [name], MD;  Location: [place] [facility] CV LAB;  Service: Cardiovascular;  Laterality: N/A;   NODE DISSECTION Right 12/24/2020   Procedure: NODE DISSECTION;  Surgeon: [name], MD;  Location: [place];  Service: Thoracic;  Laterality: Right;   PERIPHERAL VASCULAR CATHETERIZATION N/A 06/14/2016   Procedure: Lower Extremity Angiography;  Surgeon: Lorretta Harp, MD;  Location: [place] CV LAB;  Service: Cardiovascular;  Laterality: N/A;   VIDEO BRONCHOSCOPY WITH ENDOBRONCHIAL NAVIGATION Right 12/24/2020   Procedure: VIDEO BRONCHOSCOPY WITH ENDOBRONCHIAL NAVIGATION;  Surgeon: Garner Nash, DO;  Location: [facility];  Service: Pulmonary;  Laterality: Right;  VIDEO BRONCHOSCOPY WITH ENDOBRONCHIAL ULTRASOUND N/A 12/24/2020   Procedure: VIDEO BRONCHOSCOPY WITH ENDOBRONCHIAL ULTRASOUND;  Surgeon: Garner Nash, DO;  Location: [facility];  Service: Pulmonary;  Laterality: N/A;   VIDEO BRONCHOSCOPY WITH INSERTION OF INTERBRONCHIAL VALVE (IBV) N/A 12/31/2020   Procedure: VIDEO BRONCHOSCOPY WITH INSERTION OF INTERBRONCHIAL VALVE (IBV).VALVE IN CARTRIDGE 34mm,9mm. CHEST TUBE PLACEMENT.;  Surgeon: [name], MD;  [facility];  Service: Thoracic;  Laterality: N/A;       Family History  Problem Relation Age of Onset   Lung cancer Mother    Heart Problems Father    Heart attack Father 1   Stroke Father    Heart failure Father    Heart attack Maternal Grandmother    Stroke Maternal Grandmother    Heart attack Paternal Uncle    Hypertension Brother    Autoimmune disease Neg Hx     Social History   Tobacco Use   Smoking status: Former    Packs/day: 0.75    Years: 44.00    Pack years: 33.00    Types: Cigarettes    Quit date: 11/28/2015    Years since quitting: 5.4   Smokeless tobacco: Never  Vaping Use   Vaping Use: Never used  Substance Use Topics   Alcohol use: Yes    Comment: occ   Drug use: No    Comment: 06/21/2017  "nothing since the 1980s"    Home Medications Prior to Admission medications   Medication Sig Start Date End Date Taking? Authorizing Provider  acetaminophen (TYLENOL) 500 MG tablet Take 1,000 mg by mouth every 6 (six) hours as needed for moderate pain.    [provider]  albuterol (VENTOLIN HFA) 108 (90 Base) MCG/ACT inhaler Inhale 2 puffs into the lungs every 6 (six) hours as needed for wheezing or shortness of breath. 01/12/21   Garner Nash, DO  amiodarone (PACERONE) 200 MG tablet Take 1 tablet (200 mg total) by mouth daily. 01/07/21   Elgie Collard, PA-C  amLODipine (NORVASC) 10 MG tablet TAKE 1 TABLET(10 MG) BY MOUTH DAILY Patient taking differently: Take 10 mg by mouth daily. 12/02/20   [name], MD  aspirin EC 81 MG tablet Take 81 mg by mouth daily. Swallow whole.    [provider]  atorvastatin (LIPITOR) 80 MG tablet TAKE 1 TABLET(80 MG) BY MOUTH DAILY 04/01/21   [name], MD  cloNIDine (CATAPRES) 0.1 MG tablet Take 0.1 mg by mouth daily as needed (sleep). 03/12/21   [provider]  clopidogrel (PLAVIX) 75 MG tablet TAKE 1 TABLET BY MOUTH EVERY DAY Patient taking differently: Take 75 mg by mouth daily. 02/06/20   [name], MD  Diphenhyd-Hydrocort-Nystatin (FIRST-DUKES MOUTHWASH MT) Use as directed 5 mLs in the mouth or throat 4 (four) times daily -  before meals and at bedtime. 03/15/21   [provider]  lidocaine-prilocaine (EMLA) cream Apply 1 application topically once as needed (port access). 02/27/21   Aline August, MD  magnesium oxide (MAG-OX) 400 (240 Mg) MG tablet Take 1 tablet (400 mg total) by mouth daily. 04/07/21   Heilingoetter, Cassandra L, PA-C  metoprolol succinate (TOPROL-XL) 25 MG 24 hr tablet Take 1 tablet (25 mg total) by mouth daily. 08/27/20   [name], MD  naproxen (NAPROSYN) 500 MG tablet Take 1 tablet twice a day, followed by 1 tablet daily as symptoms improve. Discontinue with  resolution of symptoms 04/07/21   Heilingoetter, Cassandra L, PA-C  nitroGLYCERIN (NITROSTAT)  0.4 MG SL tablet PLACE 1 TABLET UNDER THE TONGUE EVERY 5 MINUTES AS NEEDED FOR CHEST PAIN. 3 DOSES MAX 04/16/20   [name], MD  ondansetron (ZOFRAN) 8 MG tablet Take 1 tablet (8 mg total) by mouth every 8 (eight) hours as needed for nausea or vomiting. Starting 3 days after chemotherapy 04/01/21   Heilingoetter, Cassandra L, PA-C  oxyCODONE (OXY IR/ROXICODONE) 5 MG immediate release tablet Take 1 tablet (5 mg total) by mouth every 4 (four) hours as needed for moderate pain. 04/27/21   Molpus, John, MD  prochlorperazine (COMPAZINE) 10 MG tablet Take 1 tablet (10 mg total) by mouth every 6 (six) hours as needed. 01/14/21   Heilingoetter, Cassandra L, PA-C  ranolazine (RANEXA) 1000 MG SR tablet Take 1 tablet (1,000 mg total) by mouth 2 (two) times daily. Patient taking differently: Take 1,000 mg by mouth daily. 11/08/19   [name], NP  RIVAROXABAN [employer]) VTE STARTER PACK (15 & 20 MG) Follow package directions: Take one 15mg  tablet by mouth twice a day. On day 22, switch to one 20mg  tablet once a day. Take with food. 04/27/21   Molpus, Jenny Reichmann, MD  RIVAROXABAN [employer]) VTE STARTER PACK (15 & 20 MG) Take one 15 mg tablet by mouth 2 (two) times daily. On day 22, switch to one 20 mg tablet once a day. Take with Food. 04/27/21     Tiotropium Bromide-Olodaterol (STIOLTO RESPIMAT) 2.5-2.5 MCG/ACT AERS Inhale 2 puffs into the lungs daily. 01/12/21   Garner Nash, DO    Allergies    Prednisone, Tetanus toxoids, Wellbutrin [bupropion], Indomethacin, Other, and Varenicline  Review of Systems   Review of Systems  Constitutional:  Positive for appetite change. Negative for fever.  HENT:  Negative for sore throat.   Eyes:  Negative for visual disturbance.  Respiratory:  Positive for cough and shortness of breath. Negative for hemoptysis, sputum production and wheezing.   Cardiovascular:  Positive for  chest pain.  Gastrointestinal:  Negative for abdominal pain and vomiting.  Genitourinary:  Negative for dysuria.  Musculoskeletal:  Negative for neck pain.  Skin:  Negative for rash.  Neurological:  Positive for light-headedness. Negative for headaches.   Physical Exam Updated Vital Signs BP (!) 85/63   Pulse (!) 128   Temp 98.8 F (37.1 C) (Oral)   Resp 20   Ht 5\' 9"  (1.753 m)   Wt 79.4 kg   SpO2 100%   BMI 25.84 kg/m   Physical Exam Vitals and nursing note reviewed.  Constitutional:      Appearance: Normal appearance. He is well-developed.  HENT:     Head: Normocephalic and atraumatic.  Eyes:     Conjunctiva/sclera: Conjunctivae normal.  Cardiovascular:     Rate and Rhythm: Regular rhythm. Tachycardia present.     Heart sounds: No murmur heard. Pulmonary:     Effort: Pulmonary effort is normal. No respiratory distress.     Breath sounds: Normal breath sounds.  Abdominal:     Palpations: Abdomen is soft.     Tenderness: There is no abdominal tenderness. There is no guarding or rebound.  Musculoskeletal:        General: Normal range of motion.     Cervical back: Neck supple.     Right lower leg: No edema.     Left lower leg: No edema.  Skin:    General: Skin is warm and dry.  Neurological:     General: No focal deficit present.  Mental Status: He is alert. Mental status is at baseline.    ED Results / Procedures / Treatments   Labs (all labs ordered are listed, but only abnormal results are displayed) Labs Reviewed  CULTURE, BLOOD (ROUTINE X 2)  CULTURE, BLOOD (ROUTINE X 2)  COMPREHENSIVE METABOLIC PANEL  LACTIC ACID, PLASMA  LACTIC ACID, PLASMA  CBC WITH DIFFERENTIAL/PLATELET  LIPASE, BLOOD  BRAIN NATRIURETIC PEPTIDE  URINALYSIS, ROUTINE W REFLEX MICROSCOPIC  PROTIME-INR  TROPONIN I (HIGH SENSITIVITY)    EKG None  Radiology CT Angio Chest PE W/Cm &/Or Wo Cm  Result Date: 04/27/2021 CLINICAL DATA:  Concern for pulmonary embolism. EXAM: CT  ANGIOGRAPHY CHEST WITH CONTRAST TECHNIQUE: Multidetector CT imaging of the chest was performed using the standard protocol during bolus administration of intravenous contrast. Multiplanar CT image reconstructions and MIPs were obtained to evaluate the vascular anatomy. CONTRAST:  123mL OMNIPAQUE IOHEXOL 350 MG/ML SOLN COMPARISON:  Chest CT dated 02/28/2021. FINDINGS: Cardiovascular: There is no cardiomegaly or pericardial effusion. 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 There is mild atherosclerotic calcification of the thoracic aorta. No aneurysmal dilatation or dissection. The origins of the great vessels of the aortic arch appear patent as visualized. Evaluation of the pulmonary arteries is limited due to respiratory motion artifact. There is linear nonocclusive thrombus within the lobar branches of the left lower lobe and lingula extending into the segmental pulmonary artery branches, age indeterminate. Correlation with history of recent PE recommended. Nonocclusive thrombus is also noted in the segmental branch of the right lower lobe (121/7). These findings however are new since the prior CT of 02/28/2021. Mediastinum/Nodes: No hilar or mediastinal adenopathy. The esophagus is grossly unremarkable. No mediastinal fluid collection. Lungs/Pleura: Similar appearance of small right pleural effusion with partial compressive atelectasis of the right lower lobe. Pneumonia is not excluded clinical correlation is recommended. Mild thickening of the bronchial wall of the right lower lobe. No focal consolidation or pneumothorax. The central airways remain patent. Postsurgical changes of the right lung as seen previously. Upper Abdomen: No acute abnormality. Musculoskeletal: Multilevel degenerative changes with anterior osteophyte/spurring. C7-T1 ACDF. Median sternotomy wires. No acute osseous pathology. Old healed right lateral rib fracture. Review of the MIP images confirms the above findings.  IMPRESSION: 1. Nonocclusive pulmonary emboli within the lobar branches of the left lower lobe and lingula extending into the segmental pulmonary artery branches, age indeterminate, but new since the prior CT. Nonocclusive thrombus is also noted in the segmental branch of the right lower lobe. No CT evidence of right heart straining. 2. Similar appearance of small right pleural effusion with partial compressive atelectasis of the right lower lobe. Pneumonia is not excluded clinical correlation is recommended. 3. Aortic Atherosclerosis (ICD10-I70.0). These results were called by telephone at the time of interpretation on 04/27/2021 at 1:52 am to Dr. Florina Ou, who verbally acknowledged these results. Electronically Signed   By: Anner Crete M.D.   On: 04/27/2021 02:02    Procedures .Critical Care  Date/Time: 04/29/2021 11:01 AM Performed by: Hayden Rasmussen, MD Authorized by: Hayden Rasmussen, MD   Critical care provider statement:    Critical care time (minutes):  45   Critical care time was exclusive of:  Separately billable procedures and treating other patients   Critical care was necessary to treat or prevent imminent or life-threatening deterioration of the following conditions:  Circulatory failure and sepsis   Critical care was time spent personally by me on the following activities:  Discussions with consultants, evaluation of  patient's response to treatment, examination of patient, ordering and performing treatments and interventions, ordering and review of laboratory studies, ordering and review of radiographic studies, pulse oximetry, re-evaluation of patient's condition, obtaining history from patient or surrogate, review of old charts and development of treatment plan with patient or surrogate   Medications Ordered in ED Medications  ceFEPIme (MAXIPIME) 2 g in sodium chloride 0.9 % 100 mL IVPB (0 g Intravenous Stopped 04/29/21 0257)  Chlorhexidine Gluconate Cloth 2 % PADS 6 each (6  each Topical Given by Other 04/28/21 2300)  morphine 4 MG/ML injection 4 mg (4 mg Intravenous Given 04/29/21 0802)  oxyCODONE (Oxy IR/ROXICODONE) immediate release tablet 10 mg (10 mg Oral Given 04/29/21 0509)  metroNIDAZOLE (FLAGYL) IVPB 500 mg (0 mg Intravenous Stopped 04/29/21 0901)  atorvastatin (LIPITOR) tablet 80 mg (80 mg Oral Given 04/29/21 0935)  magnesium oxide (MAG-OX) tablet 400 mg (400 mg Oral Given 04/29/21 0935)  Rivaroxaban (XARELTO) tablet 15 mg (15 mg Oral Given 04/29/21 0759)    Followed by  rivaroxaban (XARELTO) tablet 20 mg (has no administration in time range)  sodium chloride flush (NS) 0.9 % injection 10-40 mL (has no administration in time range)  sodium chloride flush (NS) 0.9 % injection 10-40 mL (has no administration in time range)  0.9 %  sodium chloride infusion (Manually program via Guardrails IV Fluids) (has no administration in time range)  sodium chloride 0.9 % bolus 1,000 mL ( Intravenous Stopped 04/28/21 1922)  fentaNYL (SUBLIMAZE) injection 50 mcg (50 mcg Intravenous Given 04/28/21 1845)  ondansetron (ZOFRAN) injection 4 mg (4 mg Intravenous Given 04/28/21 1844)  sodium chloride 0.9 % bolus 1,400 mL (0 mLs Intravenous Stopped 04/28/21 2002)  ceFEPIme (MAXIPIME) 2 g in sodium chloride 0.9 % 100 mL IVPB (0 g Intravenous Stopped 04/28/21 1936)  metroNIDAZOLE (FLAGYL) IVPB 500 mg (0 mg Intravenous Stopped 04/28/21 2048)  vancomycin (VANCOCIN) IVPB 1000 mg/200 mL premix (0 mg Intravenous Stopped 04/28/21 2204)  ondansetron (ZOFRAN) injection 4 mg (4 mg Intravenous Given 04/29/21 4332)    ED Course  I have reviewed the triage vital signs and the nursing notes.  Pertinent labs & imaging results that were available during my care of the patient were reviewed by me and considered in my medical decision making (see chart for details).  Clinical Course as of 04/29/21 1100  Tue Apr 28, 2021  1811 Chest x-ray interpreted by me as elevated right hemidiaphragm no acute  infiltrates. [MB]  1823 EKG not crossing in epic, sinus tachycardia rate of 110 normal intervals no acute ST-T changes. [MB]  1839 Lactate elevated at 3.6.  He has no white count and no fever. [MB]  2035 Discussed with [facility] hospitalist Dr. Myna Hidalgo who accepts the patient for transfer. [MB]    Clinical Course User Index [MB] Hayden Rasmussen, MD   MDM Rules/Calculators/A&P                           GRANVILLE WHITEFIELD was evaluated in Emergency Department on 04/29/2021 for the symptoms described in the history of present illness. He was evaluated in the context of the global COVID-19 pandemic, which necessitated consideration that the patient might be at risk for infection with the SARS-CoV-2 virus that causes COVID-19. Institutional protocols and algorithms that pertain to the evaluation of patients at risk for COVID-19 are in a state of rapid change based on information released by regulatory bodies including the CDC and federal and state  organizations. These policies and algorithms were followed during the patient's care in the ED. This patient complains of tachycardia weakness shortness of breath lower chest upper abdominal pain; this involves an extensive number of treatment Options and is a complaint that carries with it a high risk of complications and Morbidity. The differential includes sepsis, Sirs, dehydration, COVID, pneumonia, pneumothorax, ACS, hypovolemia  I ordered, reviewed and interpreted labs, which included CBC with normal white count, hemoglobin slightly lower than priors, chemistries with mildly elevated creatinine from priors, normal LFTs, troponin and BMP unremarkable, COVID and flu testing negative, lactate significantly elevated and will need to be trended I ordered medication IV fluids IV antibiotics, IV and oral pain medication I ordered imaging studies which included chest x-ray and I independently    visualized and interpreted imaging which showed no acute  infiltrates Additional history obtained from patient's wife Previous records obtained and reviewed in epic including prior ED visit few days ago I consulted Dr. Myna [facility] and discussed lab and imaging findings  Critical Interventions: Work-up and management of patient's hypotension and tachycardia with goal-directed IV fluids and antibiotics  After the interventions stated above, I reevaluated the patient and found patient's blood pressure and heart rate to be improved.  He will he be admitted to the hospital for further evaluation of the symptoms.  Patient agreeable to plan  Final Clinical Impression(s) / ED Diagnoses Final diagnoses:  Sinus tachycardia  Hypotension, unspecified hypotension type  Malignant neoplasm of lung, unspecified laterality, unspecified part of lung [facility])    Rx / DC Orders ED Discharge Orders     None        Hayden Rasmussen, MD 04/29/21 1105

## 2021-04-28 NOTE — Telephone Encounter (Signed)
I will have to defer to Dr. Irish Lack on this one. Med list shows ASA, plavix and now Xarelto for PE diagnosed yesterday.

## 2021-04-28 NOTE — Telephone Encounter (Signed)
STAT if HR is under 50 or over 120 (normal HR is 60-100 beats per minute)  What is your heart rate?  120 currently  150 4:15 PM   Do you have a log of your heart rate readings (document readings)?  100-150 today   Do you have any other symptoms? SOB upon exertion (but has blood clots in lungs), tired, headache

## 2021-04-28 NOTE — Sepsis Progress Note (Signed)
Code sepsis protocol being monitored by eLink. 

## 2021-04-28 NOTE — ED Triage Notes (Signed)
C/o palpitations and Sob increased since seen here sun and dx PE

## 2021-04-28 NOTE — ED Notes (Signed)
Notified by lab; lactate is 2.5.

## 2021-04-28 NOTE — Telephone Encounter (Signed)
Pt is stating that he had an EKG done at the [facility] in [place]... Pt saw results posted in my chart and would like results interpreted.. please advise

## 2021-04-28 NOTE — Telephone Encounter (Signed)
OK to stop aspirin.  COntinue Xarelto and Plavix for now.    JV

## 2021-04-28 NOTE — Telephone Encounter (Signed)
Received Stat call into [facility] T[facility]- per operator after 3 attempts at calling [facility] triage.  Spoke with patient who states that this past Saturday he went to the ER and was diagnosed with Pulmonary Emboli. Patient states that he was started on Xarelto on 8/28, and states that he called yesterday to office to check and see if it were okay for him to take Xarelto with his Plavix. Patient states he was waiting on call back today. Patient states that today his heart rate has been up in the 150's, lightheaded, nauseous, and short of breath. Patient states that is was more of a sudden onset today with symptoms while he was cleaning up after his dog. Patient states that he gets very short of breath, dizzy, and feels like he may pass out after changing positions. Had patient check his BP while on the phone and per patient his BP is 104/71 and HR 115 while sitting. Patient has a history of Atrial fib, but states he is unsure if his heart is irregular at present. Patient states that he has not been taking Amiodarone as he has been out of it and is not sure how long it has been. Patient is currently taking all medications as prescribed besides the Amiodarone. Was recently started on Xarelto on 8/28 ER visit. Patient states that he is still currently very dizzy/lightheaded with position changes. Advised patient that due to symptoms he should go to ER to be evaluated. Patient states that he will do this. Patient would like to know if he should restart the Amio as well, advised I would forward to Dr. Irish Lack for review. Patient verbalized understanding.

## 2021-04-28 NOTE — Telephone Encounter (Signed)
WOuld have him stay well hydrated and avoid strenuous activity.  Higher HR is likely from the PE.  Can take Xarelto with Plavix.  Stop aspirin.

## 2021-04-29 ENCOUNTER — Encounter (HOSPITAL_COMMUNITY): Payer: Self-pay | Admitting: Family Medicine

## 2021-04-29 DIAGNOSIS — I129 Hypertensive chronic kidney disease with stage 1 through stage 4 chronic kidney disease, or unspecified chronic kidney disease: Secondary | ICD-10-CM | POA: Diagnosis present

## 2021-04-29 DIAGNOSIS — D61818 Other pancytopenia: Secondary | ICD-10-CM

## 2021-04-29 DIAGNOSIS — I959 Hypotension, unspecified: Secondary | ICD-10-CM | POA: Diagnosis present

## 2021-04-29 DIAGNOSIS — I9589 Other hypotension: Secondary | ICD-10-CM

## 2021-04-29 DIAGNOSIS — N183 Chronic kidney disease, stage 3 unspecified: Secondary | ICD-10-CM | POA: Diagnosis present

## 2021-04-29 DIAGNOSIS — Z951 Presence of aortocoronary bypass graft: Secondary | ICD-10-CM

## 2021-04-29 DIAGNOSIS — I480 Paroxysmal atrial fibrillation: Secondary | ICD-10-CM | POA: Diagnosis present

## 2021-04-29 DIAGNOSIS — E861 Hypovolemia: Secondary | ICD-10-CM

## 2021-04-29 DIAGNOSIS — N179 Acute kidney failure, unspecified: Secondary | ICD-10-CM | POA: Diagnosis present

## 2021-04-29 DIAGNOSIS — Z20822 Contact with and (suspected) exposure to covid-19: Secondary | ICD-10-CM | POA: Diagnosis present

## 2021-04-29 DIAGNOSIS — C3490 Malignant neoplasm of unspecified part of unspecified bronchus or lung: Secondary | ICD-10-CM

## 2021-04-29 DIAGNOSIS — R000 Tachycardia, unspecified: Secondary | ICD-10-CM

## 2021-04-29 DIAGNOSIS — R6510 Systemic inflammatory response syndrome (SIRS) of non-infectious origin without acute organ dysfunction: Secondary | ICD-10-CM | POA: Diagnosis present

## 2021-04-29 DIAGNOSIS — A419 Sepsis, unspecified organism: Secondary | ICD-10-CM | POA: Diagnosis not present

## 2021-04-29 DIAGNOSIS — I252 Old myocardial infarction: Secondary | ICD-10-CM | POA: Diagnosis not present

## 2021-04-29 DIAGNOSIS — Z8673 Personal history of transient ischemic attack (TIA), and cerebral infarction without residual deficits: Secondary | ICD-10-CM | POA: Diagnosis not present

## 2021-04-29 DIAGNOSIS — D649 Anemia, unspecified: Secondary | ICD-10-CM

## 2021-04-29 DIAGNOSIS — Z86711 Personal history of pulmonary embolism: Secondary | ICD-10-CM | POA: Diagnosis not present

## 2021-04-29 DIAGNOSIS — D61810 Antineoplastic chemotherapy induced pancytopenia: Secondary | ICD-10-CM | POA: Diagnosis present

## 2021-04-29 DIAGNOSIS — Z85828 Personal history of other malignant neoplasm of skin: Secondary | ICD-10-CM | POA: Diagnosis not present

## 2021-04-29 DIAGNOSIS — I2510 Atherosclerotic heart disease of native coronary artery without angina pectoris: Secondary | ICD-10-CM | POA: Diagnosis present

## 2021-04-29 DIAGNOSIS — T451X5A Adverse effect of antineoplastic and immunosuppressive drugs, initial encounter: Secondary | ICD-10-CM | POA: Diagnosis present

## 2021-04-29 DIAGNOSIS — C3411 Malignant neoplasm of upper lobe, right bronchus or lung: Secondary | ICD-10-CM | POA: Diagnosis present

## 2021-04-29 DIAGNOSIS — Z902 Acquired absence of lung [part of]: Secondary | ICD-10-CM | POA: Diagnosis not present

## 2021-04-29 DIAGNOSIS — Z87891 Personal history of nicotine dependence: Secondary | ICD-10-CM | POA: Diagnosis not present

## 2021-04-29 DIAGNOSIS — C3491 Malignant neoplasm of unspecified part of right bronchus or lung: Secondary | ICD-10-CM

## 2021-04-29 DIAGNOSIS — C7931 Secondary malignant neoplasm of brain: Secondary | ICD-10-CM | POA: Diagnosis present

## 2021-04-29 DIAGNOSIS — I255 Ischemic cardiomyopathy: Secondary | ICD-10-CM | POA: Diagnosis present

## 2021-04-29 DIAGNOSIS — Z7902 Long term (current) use of antithrombotics/antiplatelets: Secondary | ICD-10-CM | POA: Diagnosis not present

## 2021-04-29 DIAGNOSIS — Z7901 Long term (current) use of anticoagulants: Secondary | ICD-10-CM | POA: Diagnosis not present

## 2021-04-29 DIAGNOSIS — E860 Dehydration: Secondary | ICD-10-CM | POA: Diagnosis present

## 2021-04-29 DIAGNOSIS — Z79899 Other long term (current) drug therapy: Secondary | ICD-10-CM | POA: Diagnosis not present

## 2021-04-29 LAB — CBC
HCT: 22.3 % — ABNORMAL LOW (ref 39.0–52.0)
Hemoglobin: 7.2 g/dL — ABNORMAL LOW (ref 13.0–17.0)
MCH: 29.5 pg (ref 26.0–34.0)
MCHC: 32.3 g/dL (ref 30.0–36.0)
MCV: 91.4 fL (ref 80.0–100.0)
Platelets: 112 10*3/uL — ABNORMAL LOW (ref 150–400)
RBC: 2.44 MIL/uL — ABNORMAL LOW (ref 4.22–5.81)
RDW: 15.5 % (ref 11.5–15.5)
WBC: 3.3 10*3/uL — ABNORMAL LOW (ref 4.0–10.5)
nRBC: 0 % (ref 0.0–0.2)

## 2021-04-29 LAB — FERRITIN: Ferritin: 892 ng/mL — ABNORMAL HIGH (ref 24–336)

## 2021-04-29 LAB — BASIC METABOLIC PANEL
Anion gap: 7 (ref 5–15)
BUN: 18 mg/dL (ref 8–23)
CO2: 24 mmol/L (ref 22–32)
Calcium: 8.5 mg/dL — ABNORMAL LOW (ref 8.9–10.3)
Chloride: 109 mmol/L (ref 98–111)
Creatinine, Ser: 0.98 mg/dL (ref 0.61–1.24)
GFR, Estimated: >60 mL/min (ref >60)
Glucose, Bld: 93 mg/dL (ref 70–99)
Potassium: 3.8 mmol/L (ref 3.5–5.1)
Sodium: 140 mmol/L (ref 135–145)

## 2021-04-29 LAB — RETICULOCYTES
Immature Retic Fract: 14.1 % (ref 2.3–15.9)
RBC.: 2.75 MIL/uL — ABNORMAL LOW (ref 4.22–5.81)
Retic Ct Pct: <0.4 % — ABNORMAL LOW (ref 0.4–3.1)

## 2021-04-29 LAB — IRON AND TIBC
Iron: 42 ug/dL — ABNORMAL LOW (ref 45–182)
Saturation Ratios: 16 % — ABNORMAL LOW (ref 17.9–39.5)
TIBC: 259 ug/dL (ref 250–450)
UIBC: 217 ug/dL

## 2021-04-29 LAB — VITAMIN B12: Vitamin B-12: 1133 pg/mL — ABNORMAL HIGH (ref 180–914)

## 2021-04-29 LAB — FOLATE: Folate: 6.8 ng/mL (ref >5.9)

## 2021-04-29 LAB — LACTIC ACID, PLASMA
Lactic Acid, Venous: 1 mmol/L (ref 0.5–1.9)
Lactic Acid, Venous: 0.9 mmol/L (ref 0.5–1.9)

## 2021-04-29 LAB — PREPARE RBC (CROSSMATCH)

## 2021-04-29 LAB — HEMOGLOBIN AND HEMATOCRIT, BLOOD
HCT: 26.7 % — ABNORMAL LOW (ref 39.0–52.0)
Hemoglobin: 8.9 g/dL — ABNORMAL LOW (ref 13.0–17.0)

## 2021-04-29 LAB — MRSA NEXT GEN BY PCR, NASAL: MRSA by PCR Next Gen: NOT DETECTED

## 2021-04-29 MED ORDER — CLOPIDOGREL BISULFATE 75 MG PO TABS
75.0000 mg | ORAL_TABLET | Freq: Every day | ORAL | Status: DC
  Filled 2021-04-29: qty 1

## 2021-04-29 MED ORDER — RIVAROXABAN (XARELTO) VTE STARTER PACK (15 & 20 MG): 15.0000 mg | ORAL_TABLET | Freq: Two times a day (BID) | ORAL | Status: DC

## 2021-04-29 MED ORDER — ONDANSETRON HCL 4 MG/2ML IJ SOLN
4.0000 mg | Freq: Once | INTRAMUSCULAR | Status: AC
  Administered 2021-04-29: 4 mg via INTRAVENOUS
  Filled 2021-04-29: qty 2

## 2021-04-29 MED ORDER — ATORVASTATIN CALCIUM 40 MG PO TABS
80.0000 mg | ORAL_TABLET | Freq: Every day | ORAL | Status: DC
  Administered 2021-04-29 – 2021-04-30 (×2): 80 mg via ORAL
  Filled 2021-04-29 (×2): qty 2

## 2021-04-29 MED ORDER — RIVAROXABAN 20 MG PO TABS: 20.0000 mg | ORAL_TABLET | Freq: Every day | ORAL | Status: DC

## 2021-04-29 MED ORDER — MORPHINE SULFATE (PF) 2 MG/ML IV SOLN
2.0000 mg | INTRAVENOUS | Status: DC | PRN
  Administered 2021-04-29 – 2021-04-30 (×6): 2 mg via INTRAVENOUS
  Filled 2021-04-29 (×6): qty 1

## 2021-04-29 MED ORDER — LACTATED RINGERS IV SOLN: INTRAVENOUS | Status: DC

## 2021-04-29 MED ORDER — MORPHINE SULFATE (PF) 4 MG/ML IV SOLN
4.0000 mg | INTRAVENOUS | Status: DC | PRN
Start: 2021-04-29 — End: 2021-04-29
  Administered 2021-04-29 (×2): 4 mg via INTRAVENOUS
  Filled 2021-04-29 (×3): qty 1

## 2021-04-29 MED ORDER — METRONIDAZOLE 500 MG/100ML IV SOLN
500.0000 mg | Freq: Two times a day (BID) | INTRAVENOUS | Status: DC
  Administered 2021-04-29: 500 mg via INTRAVENOUS
  Filled 2021-04-29: qty 100

## 2021-04-29 MED ORDER — MAGNESIUM OXIDE -MG SUPPLEMENT 400 (240 MG) MG PO TABS
400.0000 mg | ORAL_TABLET | Freq: Every day | ORAL | Status: DC
  Administered 2021-04-29: 400 mg via ORAL
  Filled 2021-04-29: qty 1

## 2021-04-29 MED ORDER — OXYCODONE HCL 5 MG PO TABS
10.0000 mg | ORAL_TABLET | ORAL | Status: DC | PRN
Start: 2021-04-29 — End: 2021-04-30
  Administered 2021-04-29: 10 mg via ORAL
  Filled 2021-04-29: qty 2

## 2021-04-29 MED ORDER — RIVAROXABAN 15 MG PO TABS
15.0000 mg | ORAL_TABLET | Freq: Two times a day (BID) | ORAL | Status: DC
  Administered 2021-04-29 – 2021-04-30 (×3): 15 mg via ORAL
  Filled 2021-04-29 (×3): qty 1

## 2021-04-29 MED ORDER — SODIUM CHLORIDE 0.9% FLUSH
10.0000 mL | Freq: Two times a day (BID) | INTRAVENOUS | Status: DC
  Administered 2021-04-29 – 2021-04-30 (×2): 10 mL

## 2021-04-29 MED ORDER — SODIUM CHLORIDE 0.9% IV SOLUTION: Freq: Once | INTRAVENOUS | Status: AC

## 2021-04-29 MED ORDER — ONDANSETRON HCL 4 MG/2ML IJ SOLN
4.0000 mg | Freq: Four times a day (QID) | INTRAMUSCULAR | Status: DC | PRN
  Administered 2021-04-29 (×2): 4 mg via INTRAVENOUS
  Filled 2021-04-29 (×2): qty 2

## 2021-04-29 MED ORDER — SODIUM CHLORIDE 0.9% FLUSH
10.0000 mL | INTRAVENOUS | Status: DC | PRN
  Administered 2021-04-30: 10 mL

## 2021-04-29 NOTE — H&P (Addendum)
History and Physical    [name] KZS:[id number] DOB: 04/10/1956 DOA: 04/28/2021  PCP: [name], MD  Patient coming from: [facility] ED  I have personally briefly reviewed patient's old medical records in [place]  Chief Complaint: tachycardia and lightheadedness  HPI: SHANDON BURLINGAME is a 65 y.o. 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 fib, CKD 3, treated hepatitis C, recent PE on Xarelto  who presents from outside ED with concerns of sepsis from unknown source.   Patient was seen in ED 8/28 for tachycardia and found to have bilateral subsegmental PE and started on Xarelto.  Today he was at home and was cleaning up a mess than his sick dog made when he suddenly felt very lightheaded.  He then had a pulse ox and saw his heart rate was in the 150s.  He then spoke with his cardiologist who recommended he be seen in the ED.  He reports some shortness of breath with dry cough.  Had nausea but denies any vomiting.  Had an episode of diarrhea today.  Denies any recent sick contacts or fever.  ED Course: He was afebrile but hypotensive down to 80s over 63 with a lactate of 3.6 but no leukocytosis.  Creatinine of 1.26 from a prior of 1.03.  Blood pressure was responsive to IV fluids. Troponin of 4.  Negative COVID and flu PCR test.  Chest x-ray shows mild right pleural effusion but no other acute findings.  UA negative.  He was started on IV vancomycin, Flagyl and cefepime for sepsis of unknown source.  Review of Systems: Constitutional: No Weight Change, No Fever ENT/Mouth: No sore throat, No Rhinorrhea Eyes: No Eye Pain, No Vision Changes Cardiovascular: No Chest Pain, + SOB Respiratory: + Cough, No Sputum, No Wheezing, no Dyspnea  Gastrointestinal: + Nausea, No Vomiting, + Diarrhea, No  Constipation, No Pain Genitourinary: no Urinary Incontinence, No Urgency, No Flank Pain Musculoskeletal: No Arthralgias, No Myalgias Skin: No Skin Lesions, No Pruritus, Neuro: no Weakness, No Numbness Psych: No Anxiety/Panic, No Depression, no decrease appetite Heme/Lymph: No Bruising, No Bleeding  Past Medical History:  Diagnosis Date   Anxiety    Arthritis    Basal cell carcinoma (BCC) of forehead    CAD (coronary artery disease)    a. 10/2015 ant STEMI >> LHC with 3 v CAD; oLAD tx with POBA >> emergent CABG. b. Multiple evals since that time, early graft failure of SVG-RCA by cath 03/2016. c. 2/19 PCI/DES x1 to pRCA, normal EF.   Carotid artery disease ([place])    a. 40-59% BICA 02/2018.   Depression    Dyspnea    Ectopic atrial tachycardia (HCC)    Esophageal reflux    eosinophil esophagitis   Family history of adverse reaction to anesthesia    "sister has PONV" (06/21/2017)   Former tobacco use    Gout    Hepatitis C    "treated and cured" (06/21/2017)   High cholesterol    History of kidney stones    Hypertension    Ischemic cardiomyopathy    a. EF 25-30% at intraop TEE 4/17  //  b. Limited Echo 5/17 - EF 45-50%, mild ant HK. c. EF 55-65% by cath 09/2017.   Migraine    "3-4/yr" (06/21/2017)   Myocardial infarction ([place]) 10/2015   Palpitations  Sinus bradycardia    a. HR dropping into 40s in 02/2016 -> BB reduced.   Stroke [facility] 10/2016   "small one; sometimes my memory/cognitive issues" (06/21/2017)   Symptomatic hypotension    a. 02/2016 ER visit -> meds reduced.   Syncope    Wears dentures    Wears glasses     Past Surgical History:  Procedure Laterality Date   25 GAUGE PARS PLANA VITRECTOMY WITH 20 GAUGE MVR PORT  12/31/2020   Procedure: 25 GAUGE PARS PLANA VITRECTOMY WITH 20 GAUGE MVR PORT;  Surgeon: [name], MD;  Location: [place];  Service: Thoracic;;   ANTERIOR CERVICAL DECOMP/DISCECTOMY FUSION N/A 10/17/2018   Procedure: Anterior Cervical  Decompression Fusion - Cervical seven -Thoracic one;  Surgeon: Consuella Lose, MD;  Location: [place];  Service: Neurosurgery;  Laterality: N/A;   BASAL CELL CARCINOMA EXCISION     "forehead   BIOPSY  07/20/2019   Procedure: BIOPSY;  Surgeon: Carol Ada, MD;  Location: [facility];  Service: Endoscopy;;   BRONCHIAL BIOPSY  12/24/2020   Procedure: BRONCHIAL BIOPSIES;  Surgeon: Garner Nash, DO;  Location: [facility];  Service: Pulmonary;;   BRONCHIAL BRUSHINGS  12/24/2020   Procedure: BRONCHIAL BRUSHINGS;  Surgeon: Garner Nash, DO;  Location: [place];  Service: Pulmonary;;   BRONCHIAL NEEDLE ASPIRATION BIOPSY  12/24/2020   Procedure: BRONCHIAL NEEDLE ASPIRATION BIOPSIES;  Surgeon: Garner Nash, DO;  Location: [place];  Service: Pulmonary;;   CARDIAC CATHETERIZATION N/A 11/28/2015   Procedure: Left Heart Cath and Coronary Angiography;  Surgeon: [name], MD;  Location: [place] [facility];  Service: Cardiovascular;  Laterality: N/A;   CARDIAC CATHETERIZATION N/A 11/28/2015   Procedure: Coronary Balloon Angioplasty;  Surgeon: [name], MD;  Location: [place] [facility];  Service: Cardiovascular;  Laterality: N/A;  ostial LAD   CARDIAC CATHETERIZATION N/A 11/28/2015   Procedure: Coronary/Graft Angiography;  Surgeon: [name], MD;  Location: [place] [facility];  Service: Cardiovascular;  Laterality: N/A;  coronaries only    CARDIAC CATHETERIZATION N/A 04/21/2016   Procedure: Left Heart Cath and Coronary Angiography;  Surgeon: Wellington Hampshire, MD;  Location: [place] [facility];  Service: Cardiovascular;  Laterality: N/A;   CARDIAC CATHETERIZATION N/A 06/14/2016   Procedure: Left Heart Cath and Cors/Grafts Angiography;  Surgeon: Lorretta Harp, MD;  Location: [place] [facility];  Service: Cardiovascular;  Laterality: N/A;   CARDIAC CATHETERIZATION N/A 09/08/2016   Procedure: Left Heart Cath and Cors/Grafts Angiography;  Surgeon: Wellington Hampshire, MD;  Location: [place] [facility];  Service: Cardiovascular;  Laterality: N/A;   CARDIAC CATHETERIZATION     CORONARY ARTERY BYPASS GRAFT N/A 11/28/2015   Procedure: CORONARY ARTERY BYPASS GRAFTING (CABG) TIMES FIVE USING LEFT INTERNAL MAMMARY ARTERY AND RIGHT GREATER SAPHENOUS,VIEN HARVEATED BY ENDOVIEN, INTRAOPPRATIVE TEE;  Surgeon: Gaye Pollack, MD;  Location: [place];  Service: Open Heart Surgery;  Laterality: N/A;   CORONARY STENT INTERVENTION N/A 10/05/2017   Procedure: CORONARY STENT INTERVENTION;  Surgeon: [name], MD;  Location: [place] [facility];  Service: Cardiovascular;  Laterality: N/A;   ESOPHAGOGASTRODUODENOSCOPY (EGD) WITH PROPOFOL N/A 07/20/2019   Procedure: ESOPHAGOGASTRODUODENOSCOPY (EGD) WITH PROPOFOL;  Surgeon: Carol Ada, MD;  [facility];  Service: Endoscopy;  Laterality: N/A;   ESOPHAGOGASTRODUODENOSCOPY (EGD) WITH PROPOFOL N/A 01/30/2021   Procedure: ESOPHAGOGASTRODUODENOSCOPY (EGD) WITH PROPOFOL;  Surgeon: Carol Ada, MD;  [facility];  Service: Endoscopy;  Laterality: N/A;   FIDUCIAL  MARKER PLACEMENT  12/24/2020   Procedure: FIDUCIAL DYE MARKER PLACEMENT;  Surgeon: Garner Nash, DO;  Location: [place];  Service: Pulmonary;;   HUMERUS SURGERY Right 1969   "tumor inside bone; filled it w/bone chips"   INTERCOSTAL NERVE BLOCK Right 12/24/2020   Procedure: INTERCOSTAL NERVE BLOCK;  Surgeon: [name], MD;  Location: [place];  Service: Thoracic;  Laterality: Right;   IR IMAGING GUIDED PORT INSERTION  02/06/2021   LEFT HEART CATH AND CORS/GRAFTS ANGIOGRAPHY N/A 03/11/2017   Procedure: Left Heart Cath and Cors/Grafts Angiography;  Surgeon: Leonie Man, MD;  Location: [place] [facility];  Service: Cardiovascular;  Laterality: N/A;   LEFT HEART CATH AND CORS/GRAFTS ANGIOGRAPHY N/A 10/05/2017   Procedure: LEFT HEART CATH AND CORS/GRAFTS ANGIOGRAPHY;  Surgeon: [name], MD;  Location: [place] [facility];   Service: Cardiovascular;  Laterality: N/A;   LEFT HEART CATH AND CORS/GRAFTS ANGIOGRAPHY N/A 04/11/2019   Procedure: LEFT HEART CATH AND CORS/GRAFTS ANGIOGRAPHY;  Surgeon: [name], MD;  Location: [place] [facility];  Service: Cardiovascular;  Laterality: N/A;   NODE DISSECTION Right 12/24/2020   Procedure: NODE DISSECTION;  Surgeon: [name], MD;  Location: [place];  Service: Thoracic;  Laterality: Right;   PERIPHERAL VASCULAR CATHETERIZATION N/A 06/14/2016   Procedure: Lower Extremity Angiography;  Surgeon: Lorretta Harp, MD;  Location: [place] [facility];  Service: Cardiovascular;  Laterality: N/A;   VIDEO BRONCHOSCOPY WITH ENDOBRONCHIAL NAVIGATION Right 12/24/2020   Procedure: VIDEO BRONCHOSCOPY WITH ENDOBRONCHIAL NAVIGATION;  Surgeon: Garner Nash, DO;  Location: [place];  Service: Pulmonary;  Laterality: Right;   VIDEO BRONCHOSCOPY WITH ENDOBRONCHIAL ULTRASOUND N/A 12/24/2020   Procedure: VIDEO BRONCHOSCOPY WITH ENDOBRONCHIAL ULTRASOUND;  Surgeon: Garner Nash, DO;  Location: [place];  Service: Pulmonary;  Laterality: N/A;   VIDEO BRONCHOSCOPY WITH INSERTION OF INTERBRONCHIAL VALVE (IBV) N/A 12/31/2020   Procedure: VIDEO BRONCHOSCOPY WITH INSERTION OF INTERBRONCHIAL VALVE (IBV).VALVE IN CARTRIDGE 1mm,9mm. CHEST TUBE PLACEMENT.;  Surgeon: [name], MD;  [facility];  Service: Thoracic;  Laterality: N/A;     reports that he quit smoking about 5 years ago. His smoking use included cigarettes. He has a 33.00 pack-year smoking history. He has never used smokeless tobacco. He reports current alcohol use. He reports that he does not use drugs. Social History  Allergies  Allergen Reactions   Prednisone Other (See Comments)    States that this med makes him "crazy"   Tetanus Toxoids Swelling and Other (See Comments)    Fever, Swelling of the arm    Wellbutrin [Bupropion] Other (See Comments)    Crazy thoughts, nightmares   Indomethacin Other  (See Comments)    rectal bleeding   Other     Other reaction(s): Unknown   Varenicline Other (See Comments)    Dreams Other reaction(s): Unknown Other reaction(s): Unknown    Family History  Problem Relation Age of Onset   Lung cancer Mother    Heart Problems Father    Heart attack Father 40   Stroke Father    Heart failure Father    Heart attack Maternal Grandmother    Stroke Maternal Grandmother    Heart attack Paternal Uncle    Hypertension Brother    Autoimmune disease Neg Hx      Prior to Admission medications   Medication Sig Start Date End Date Taking? Authorizing Provider  acetaminophen (TYLENOL) 500 MG tablet Take 1,000 mg by mouth every 6 (six) hours  as needed for moderate pain.   Yes [provider]  albuterol (VENTOLIN HFA) 108 (90 Base) MCG/ACT inhaler Inhale 2 puffs into the lungs every 6 (six) hours as needed for wheezing or shortness of breath. 01/12/21  Yes Icard, Octavio Graves, DO  aspirin EC 81 MG tablet Take 81 mg by mouth daily. Swallow whole.   Yes [provider]  atorvastatin (LIPITOR) 80 MG tablet TAKE 1 TABLET(80 MG) BY MOUTH DAILY 04/01/21  Yes [name], MD  cloNIDine (CATAPRES) 0.1 MG tablet Take 0.1 mg by mouth daily as needed (sleep). 03/12/21  Yes [provider]  lidocaine-prilocaine (EMLA) cream Apply 1 application topically once as needed (port access). 02/27/21  Yes Aline August, MD  magnesium oxide (MAG-OX) 400 (240 Mg) MG tablet Take 1 tablet (400 mg total) by mouth daily. 04/07/21  Yes Heilingoetter, Cassandra L, PA-C  metoprolol succinate (TOPROL-XL) 25 MG 24 hr tablet Take 1 tablet (25 mg total) by mouth daily. 08/27/20  Yes [name], MD  naproxen (NAPROSYN) 500 MG tablet Take 1 tablet twice a day, followed by 1 tablet daily as symptoms improve. Discontinue with resolution of symptoms 04/07/21  Yes Heilingoetter, Cassandra L, PA-C  nitroGLYCERIN (NITROSTAT) 0.4 MG SL tablet PLACE 1 TABLET UNDER THE  TONGUE EVERY 5 MINUTES AS NEEDED FOR CHEST PAIN. 3 DOSES MAX 04/16/20  Yes [name], MD  ondansetron (ZOFRAN) 8 MG tablet Take 1 tablet (8 mg total) by mouth every 8 (eight) hours as needed for nausea or vomiting. Starting 3 days after chemotherapy 04/01/21  Yes Heilingoetter, Cassandra L, PA-C  oxyCODONE (OXY IR/ROXICODONE) 5 MG immediate release tablet Take 1 tablet (5 mg total) by mouth every 4 (four) hours as needed for moderate pain. 04/27/21  Yes Molpus, John, MD  prochlorperazine (COMPAZINE) 10 MG tablet Take 1 tablet (10 mg total) by mouth every 6 (six) hours as needed. 01/14/21  Yes Heilingoetter, Cassandra L, PA-C  RIVAROXABAN (XARELTO) VTE STARTER PACK (15 & 20 MG) Take one 15 mg tablet by mouth 2 (two) times daily. On day 22, switch to one 20 mg tablet once a day. Take with Food. 04/27/21  Yes   amiodarone (PACERONE) 200 MG tablet Take 1 tablet (200 mg total) by mouth daily. Patient not taking: Reported on 04/28/2021 01/07/21   Elgie Collard, PA-C  amLODipine (NORVASC) 10 MG tablet TAKE 1 TABLET(10 MG) BY MOUTH DAILY Patient not taking: Reported on 04/28/2021 12/02/20   [name], MD  clopidogrel (PLAVIX) 75 MG tablet TAKE 1 TABLET BY MOUTH EVERY DAY Patient taking differently: Take 75 mg by mouth daily. 02/06/20   [name], MD  Diphenhyd-Hydrocort-Nystatin (FIRST-DUKES MOUTHWASH MT) Use as directed 5 mLs in the mouth or throat 4 (four) times daily -  before meals and at bedtime. Patient not taking: No sig reported 03/15/21   [provider]  ranolazine (RANEXA) 1000 MG SR tablet Take 1 tablet (1,000 mg total) by mouth 2 (two) times daily. Patient not taking: Reported on 04/28/2021 11/08/19   [name], NP  RIVAROXABAN Alveda Reasons) VTE STARTER PACK (15 & 20 MG) Follow package directions: Take one 15mg  tablet by mouth twice a day. On day 22, switch to one 20mg  tablet once a day. Take with food. 04/27/21   Molpus, John, MD  Tiotropium Bromide-Olodaterol  (STIOLTO RESPIMAT) 2.5-2.5 MCG/ACT AERS Inhale 2 puffs into the lungs daily. 01/12/21   Garner Nash, DO    Physical Exam: Vitals:  04/28/21 2255 04/28/21 2300 04/29/21 0000 04/29/21 0112  BP:  (!) 103/55 101/68   Pulse:  91 89   Resp:  (!) 24 14   Temp:  98.9 F (37.2 C)  98.5 F (36.9 C)  TempSrc:  Oral  Oral  SpO2:  100% 100%   Weight: 77.7 kg     Height: 5\' 9"  (1.753 m)       Constitutional: NAD, calm, comfortable, well appearing male approximately 20 degree incline in bed Vitals:   04/28/21 2255 04/28/21 2300 04/29/21 0000 04/29/21 0112  BP:  (!) 103/55 101/68   Pulse:  91 89   Resp:  (!) 24 14   Temp:  98.9 F (37.2 C)  98.5 F (36.9 C)  TempSrc:  Oral  Oral  SpO2:  100% 100%   Weight: 77.7 kg     Height: 5\' 9"  (1.753 m)      Eyes: PERRL, lids and conjunctivae normal ENMT: Mucous membranes are moist.  Neck: normal, supple Respiratory: clear to auscultation bilaterally, no wheezing, no crackles. Normal respiratory effort. No accessory muscle use.  Cardiovascular: Regular rate and rhythm, no murmurs / rubs / gallops. No extremity edema.  Abdomen: epigastric tenderness, no masses palpated.  Bowel sounds positive.  Musculoskeletal: no clubbing / cyanosis. No joint deformity upper and lower extremities. Good ROM, no contractures. Normal muscle tone.  Skin: no rashes, lesions, ulcers. No induration Neurologic: CN 2-12 grossly intact. Sensation intact,  Strength 5/5 in all 4.  Psychiatric: Normal judgment and insight. Alert and oriented x 3. Normal mood.    Labs on Admission: I have personally reviewed following labs and imaging studies  CBC: Recent Labs  Lab 04/26/21 1829 04/28/21 1751  WBC 35.4* 5.2  NEUTROABS 33.3* 2.8  HGB 9.9* 9.6*  HCT 29.5* 29.8*  MCV 89.9 91.4  PLT 235 664   Basic Metabolic Panel: Recent Labs  Lab 04/26/21 1829 04/28/21 1751  NA 136 137  K 4.0 4.0  CL 100 101  CO2 27 24  GLUCOSE 88 111*  BUN 26* 23  CREATININE 1.03 1.26*   CALCIUM 8.9 9.4   GFR: Estimated Creatinine Clearance: 57.7 mL/min (A) (by C-G formula based on SCr of 1.26 mg/dL (H)). Liver Function Tests: Recent Labs  Lab 04/26/21 1829 04/28/21 1751  AST 18 17  ALT 14 14  ALKPHOS 106 125  BILITOT 0.3 0.7  PROT 6.5 7.4  ALBUMIN 3.4* 3.9   Recent Labs  Lab 04/28/21 1751  LIPASE 19   No results for input(s): AMMONIA in the last 168 hours. Coagulation Profile: Recent Labs  Lab 04/26/21 1829 04/28/21 1751  INR 1.0 2.1*   Cardiac Enzymes: No results for input(s): CKTOTAL, CKMB, CKMBINDEX, TROPONINI in the last 168 hours. BNP (last 3 results) No results for input(s): PROBNP in the last 8760 hours. HbA1C: No results for input(s): HGBA1C in the last 72 hours. CBG: No results for input(s): GLUCAP in the last 168 hours. Lipid Profile: No results for input(s): CHOL, HDL, LDLCALC, TRIG, CHOLHDL, LDLDIRECT in the last 72 hours. Thyroid Function Tests: No results for input(s): TSH, T4TOTAL, FREET4, T3FREE, THYROIDAB in the last 72 hours. Anemia Panel: No results for input(s): VITAMINB12, FOLATE, FERRITIN, TIBC, IRON, RETICCTPCT in the last 72 hours. Urine analysis:    Component Value Date/Time   COLORURINE YELLOW 04/26/2021 2053   APPEARANCEUR CLEAR 04/26/2021 2053   LABSPEC 1.015 04/26/2021 2053   PHURINE 7.0 04/26/2021 2053   GLUCOSEU NEGATIVE 04/26/2021 2053   HGBUR NEGATIVE  04/26/2021 2053   BILIRUBINUR NEGATIVE 04/26/2021 2053   KETONESUR NEGATIVE 04/26/2021 2053   PROTEINUR NEGATIVE 04/26/2021 2053   UROBILINOGEN 0.2 05/13/2014 0658   NITRITE NEGATIVE 04/26/2021 2053   LEUKOCYTESUR NEGATIVE 04/26/2021 2053    Radiological Exams on Admission: DG Chest Port 1 View  Result Date: 04/28/2021 CLINICAL DATA:  Shortness of breath. EXAM: PORTABLE CHEST 1 VIEW COMPARISON:  April 26, 2021. FINDINGS: The heart size and mediastinal contours are within normal limits. Status post coronary artery bypass graft. Right internal jugular  Port-A-Cath is unchanged in position left lung is clear. No pneumothorax is noted. Stable right pleural effusion is noted with associated atelectasis. The visualized skeletal structures are unremarkable. IMPRESSION: Stable mild right pleural effusion with associated right basilar atelectasis or scarring. Electronically Signed   By: Marijo Conception M.D.   On: 04/28/2021 18:33      Assessment/Plan  Sepsis unknown source  -pt presented with tachycardia, hypotension and elevated lactate -no clear source of infection but reports nausea and diarrhea today. Will check GI and C.diff panel  -continue IV vancomycin, Flagyl and cefepime pending blood cultures  Hypotension -likely secondary to sepsis -improved with IV fluids -hold all antihypertensives  AKI -creatinine of 1.26 from prior of 1.03 -monitor with repeat labs following IV fluid  Recent bilateral subsegmental PE - Continue Xarelto  Stage IV neuroendocrine carcinoma of right lung with brain metastases Followed by Dr. [name].  Last chemotherapy was on 8/22 -Continue home  Oxycodone 5 mg as needed for pain.  Also complaining of epigastric pain today, will add IV morphine 4mg  PRN for severe pain   History of coronary artery disease status post CABG Continue plavix and statin   Normocytic anemia -stable at baseline  Paroxysmal atrial fibrillation Amiodarone 200 mg p.o. daily   Level of care: Stepdown  Status is: Observation  The patient remains OBS appropriate and will d/c before 2 midnights.  Dispo: The patient is from: Home              Anticipated d/c is to: Home              Patient currently is not medically stable to d/c.   Difficult to place patient No         Orene Desanctis DO [facility]   If 7PM-7AM, please contact night-coverage [email or web address]   04/29/2021, 1:31 AM

## 2021-04-29 NOTE — Progress Notes (Signed)
PROGRESS NOTE  [name] QQI:[id number] DOB: 08/31/1955   PCP: [name], MD  Patient is from: Home.  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 fib, recent diagnosis of PE on starter pack Xarelto, and treated hep C presenting with lightheadedness, tachycardia to 150s and shortness of breath, and admitted for hypotension with BP in 80s/60s, AKI and SIRS.  Cultures obtained.  Resuscitated with IV fluid and started on broad-spectrum antibiotics.   Subjective: Seen and examined earlier this morning.  No major events overnight of this morning.  Still with significant tachycardia with minimal exertion with heart rate jumping to 120s and 130s.  Feels some right-sided chest pain that he attributes to his recent blood clot.  Lightheadedness has improved.  Denies fever, chills, GI or UTI symptoms.  He denies melena or hematochezia.  Had 1 episode of loose bowel movement yesterday but not more.  Objective: Vitals:   04/29/21 1100 04/29/21 1200 04/29/21 1334 04/29/21 1405  BP: (!) 104/34 108/82 117/62 121/63  Pulse: (!) 102 (!) 101 97 (!) 102  Resp: (!) 21 13 16 16   Temp:   98.6 F (37 C) 98.4 F (36.9 C)  TempSrc:   Oral Oral  SpO2: 100% 100% 100% 100%  Weight:      Height:        Intake/Output Summary (Last 24 hours) at 04/29/2021 1421 Last data filed at 04/29/2021 1100 Gross per 24 hour  Intake 3389.88 ml  Output 500 ml  Net 2889.88 ml   Filed Weights   04/28/21 1740 04/28/21 2255  Weight: 79.4 kg 77.7 kg    Examination:  GENERAL: No apparent distress.  Nontoxic. HEENT: MMM.  Vision and hearing grossly intact.  NECK: Supple.  No apparent JVD.  RESP: On RA.  No IWOB.  Fair aeration bilaterally. CVS:  Tachycardic to 110s.  Heart sounds normal.  ABD/GI/GU: BS+. Abd soft, NTND.  MSK/EXT:  Moves extremities. No apparent deformity. No edema.  SKIN: no apparent skin lesion or wound NEURO: Awake, alert and oriented appropriately.  No apparent focal neuro deficit. PSYCH: Calm. Normal affect.   Procedures:  None  Microbiology summarized: HERDE-08 and influenza PCR nonreactive. Blood cultures NGTD.   Assessment & Plan: SIRS-likely from dehydration and anemia versus infectious process.  COVID-19 PCR and blood cultures NGTD.  MRSA PCR screen negative.  Resolving. -Monitor off IV antibiotics  Near syncope/hypotension-likely from dehydration.  Improved. -Check orthostatic vitals  Symptomatic anemia/pancytopenia-recently started on Xarelto in addition to DAPT but denies melena or hematochezia.  Dilutional?  Anemia panel without significant finding. Recent Labs    03/24/21 0553 03/25/21 0500 03/30/21 0803 04/01/21 0809 04/07/21 1409 04/13/21 0840 04/20/21 0746 04/26/21 1829 04/28/21 1751 04/29/21 0237  HGB 7.9* 7.7* 8.0* 7.2* 10.1* 10.4* 10.0* 9.9* 9.6* 7.2*  -Transfuse 1 unit-verbally consulted. -Goal Hgb > 8.0 given CAD and symptoms -Recheck CBC  Recent bilateral subsegmental PE -Continue starter pack Xarelto.  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 Earlie Server.  Last chemo on 8/22. -Outpatient follow-up   History of CAD/CABG in 2017 with early graft failure and DES to pRCA in 2019-denies chest pain.  Troponin negative. -Would continue Plavix and hold aspirin if H&H stable after  transfusion.  AKI: Resolved. Recent Labs    03/24/21 0553 03/25/21 0500 03/30/21 0803 04/01/21 0809 04/07/21 1409 04/13/21 0840 04/20/21 0746 04/26/21 1829 04/28/21 1751 04/29/21 0237  BUN 15 15 15 22 21 17  27* 26* 23 18  CREATININE 1.12 1.02 1.00 0.92 0.92 0.97 0.96 1.03 1.26* 0.98  -Discontinue IV fluid  Paroxysmal A. fib: In  sinus rhythm but tachycardic. -Continue home amiodarone -On Xarelto for anticoagulation.  Thrombocytopenia: Unclear etiology of this.  Due to chemotherapy? Recent Labs  Lab 04/26/21 1829 04/28/21 1751 04/29/21 0237  PLT 235 174 112*  -Recheck in the morning   Body mass index is 25.3 kg/m.         DVT prophylaxis:   Rivaroxaban (XARELTO) tablet 15 mg  rivaroxaban (XARELTO) tablet 20 mg  Code Status: Full code Family Communication: Patient and/or RN. Available if any question.  Level of care: Telemetry Status is: Observation  The patient will require care spanning > 2 midnights and should be moved to inpatient because: Hemodynamically unstable, IV treatments appropriate due to intensity of illness or inability to take PO, and Inpatient level of care appropriate due to severity of illness  Dispo: The patient is from: Home              Anticipated d/c is to: Home              Patient currently is not medically stable to d/c.   Difficult to place patient No       Consultants:  None   Sch Meds:  Scheduled Meds:  atorvastatin  80 mg Oral Daily   Chlorhexidine Gluconate Cloth  6 each Topical Daily   magnesium oxide  400 mg Oral Daily   rivaroxaban  15 mg Oral BID   Followed by   Derrill Memo ON 05/19/2021] rivaroxaban  20 mg Oral QPC supper   sodium chloride flush  10-40 mL Intracatheter Q12H   Continuous Infusions:   PRN Meds:.morphine injection, ondansetron (ZOFRAN) IV, oxyCODONE, sodium chloride flush  Antimicrobials: Anti-infectives (From admission, onward)    Start     Dose/Rate Route Frequency Ordered Stop   04/29/21 2100  vancomycin (VANCOREADY) IVPB 1500 mg/300 mL  Status:  Discontinued        1,500 mg 150 mL/hr over 120 Minutes Intravenous Every 24 hours 04/28/21 2208 04/29/21 0733   04/29/21 0800  metroNIDAZOLE (FLAGYL) IVPB 500 mg  Status:  Discontinued        500 mg 100 mL/hr over 60 Minutes Intravenous Every 12 hours 04/29/21 0146 04/29/21 1148    04/29/21 0300  ceFEPIme (MAXIPIME) 2 g in sodium chloride 0.9 % 100 mL IVPB  Status:  Discontinued        2 g 200 mL/hr over 30 Minutes Intravenous Every 8 hours 04/28/21 2208 04/29/21 1420   04/28/21 1845  ceFEPIme (MAXIPIME) 2 g in sodium chloride 0.9 % 100 mL IVPB        2 g 200 mL/hr over 30 Minutes Intravenous  Once 04/28/21 1842 04/28/21 1936   04/28/21 1845  metroNIDAZOLE (FLAGYL) IVPB 500 mg        500 mg 100 mL/hr over 60 Minutes Intravenous  Once 04/28/21 1842 04/28/21 2048   04/28/21 1845  vancomycin (VANCOCIN) IVPB 1000 mg/200 mL premix        1,000 mg 200 mL/hr over 60 Minutes Intravenous  Once 04/28/21 1842 04/28/21 2204        I have personally reviewed the following labs and images: CBC:  Recent Labs  Lab 04/26/21 1829 04/28/21 1751 04/29/21 0237  WBC 35.4* 5.2 3.3*  NEUTROABS 33.3* 2.8  --   HGB 9.9* 9.6* 7.2*  HCT 29.5* 29.8* 22.3*  MCV 89.9 91.4 91.4  PLT 235 174 112*   BMP &GFR Recent Labs  Lab 04/26/21 1829 04/28/21 1751 04/29/21 0237  NA 136 137 140  K 4.0 4.0 3.8  CL 100 101 109  CO2 27 24 24   GLUCOSE 88 111* 93  BUN 26* 23 18  CREATININE 1.03 1.26* 0.98  CALCIUM 8.9 9.4 8.5*   Estimated Creatinine Clearance: 74.1 mL/min (by C-G formula based on SCr of 0.98 mg/dL). Liver & Pancreas: Recent Labs  Lab 04/26/21 1829 04/28/21 1751  AST 18 17  ALT 14 14  ALKPHOS 106 125  BILITOT 0.3 0.7  PROT 6.5 7.4  ALBUMIN 3.4* 3.9   Recent Labs  Lab 04/28/21 1751  LIPASE 19   No results for input(s): AMMONIA in the last 168 hours. Diabetic: No results for input(s): HGBA1C in the last 72 hours. No results for input(s): GLUCAP in the last 168 hours. Cardiac Enzymes: No results for input(s): CKTOTAL, CKMB, CKMBINDEX, TROPONINI in the last 168 hours. No results for input(s): PROBNP in the last 8760 hours. Coagulation Profile: Recent Labs  Lab 04/26/21 1829 04/28/21 1751  INR 1.0 2.1*   Thyroid Function Tests: No results for input(s): TSH,  T4TOTAL, FREET4, T3FREE, THYROIDAB in the last 72 hours. Lipid Profile: No results for input(s): CHOL, HDL, LDLCALC, TRIG, CHOLHDL, LDLDIRECT in the last 72 hours. Anemia Panel: Recent Labs    04/29/21 0806  VITAMINB12 1,133*  FOLATE 6.8  FERRITIN 892*  TIBC 259  IRON 42*  RETICCTPCT <0.4*   Urine analysis:    Component Value Date/Time   COLORURINE YELLOW 04/26/2021 2053   APPEARANCEUR CLEAR 04/26/2021 2053   LABSPEC 1.015 04/26/2021 2053   PHURINE 7.0 04/26/2021 2053   GLUCOSEU NEGATIVE 04/26/2021 2053   HGBUR NEGATIVE 04/26/2021 2053   BILIRUBINUR NEGATIVE 04/26/2021 2053   KETONESUR NEGATIVE 04/26/2021 2053   PROTEINUR NEGATIVE 04/26/2021 2053   UROBILINOGEN 0.2 05/13/2014 0658   NITRITE NEGATIVE 04/26/2021 2053   LEUKOCYTESUR NEGATIVE 04/26/2021 2053   Sepsis Labs: Invalid input(s): PROCALCITONIN, [place]  Microbiology: Recent Results (from the past 240 hour(s))  Culture, blood (routine x 2)     Status: None (Preliminary result)   Collection Time: 04/28/21  6:02 PM   Specimen: BLOOD  Result Value Ref Range Status   Specimen Description   Final    BLOOD RIGHT ANTECUBITAL Performed at [facility], [address] [place], [place] [address]    Special Requests   Final    BOTTLES DRAWN AEROBIC AND ANAEROBIC Blood Culture adequate volume Performed at [facility], [address] [place], [place] [address]    Culture   Final    NO GROWTH < 12 HOURS Performed at [facility], [place] [address], [place], [place] [address]    Report Status PENDING  Incomplete  Culture, blood (routine x 2)     Status: None (Preliminary result)   Collection Time: 04/28/21  6:18 PM   Specimen: BLOOD  Result Value Ref Range Status   Specimen Description   Final    BLOOD BLOOD LEFT HAND Performed at [facility], [address] [place], [place] [address]    Special Requests   Final    BOTTLES DRAWN AEROBIC AND ANAEROBIC Blood Culture  adequate volume Performed at [facility], [address] [place], [place] [address]    Culture   Final    NO GROWTH < 12 HOURS Performed at [facility], [place] [address], [place], [place] [address]    Report Status PENDING  Incomplete  Resp Panel by RT-PCR (Flu A&B, Covid) Nasopharyngeal Swab     Status: None   Collection Time: 04/28/21  7:00 PM   Specimen: Nasopharyngeal Swab; Nasopharyngeal(NP) swabs in vial transport medium  Result Value Ref Range Status   SARS Coronavirus 2 by RT PCR NEGATIVE NEGATIVE Final    Comment: (NOTE) SARS-CoV-2 target nucleic acids are NOT DETECTED.  The SARS-CoV-2 RNA is generally detectable in upper respiratory specimens during the acute phase of infection. The lowest concentration of SARS-CoV-2 viral copies this assay can detect is 138 copies/mL. A negative result does not preclude SARS-Cov-2 infection and should not be used as the sole basis for treatment or other patient management decisions. A negative result may occur with  improper specimen collection/handling, submission of specimen other than nasopharyngeal swab, presence of viral mutation(s) within the areas targeted by this assay, and inadequate number of viral copies(<138 copies/mL). A negative result must be combined with clinical observations, patient history, and epidemiological information. The expected result is Negative.  Fact Sheet for Patients:  EntrepreneurPulse.com.au  Fact Sheet for Healthcare Providers:  IncredibleEmployment.be  This test is no t yet approved or cleared by the [place] FDA and  has been authorized for detection and/or diagnosis of SARS-CoV-2 by FDA under an Emergency Use Authorization (EUA). This EUA will remain  in effect (meaning this test can be used) for the duration of the COVID-19 declaration under Section 564(b)(1) of the Act, 21 U.S.C.section 360bbb-3(b)(1), unless the authorization is  terminated  or revoked sooner.       Influenza A by PCR NEGATIVE NEGATIVE Final   Influenza B by PCR NEGATIVE NEGATIVE Final    Comment: (NOTE) The Xpert Xpress SARS-CoV-2/FLU/RSV plus assay is intended as an aid in the diagnosis of influenza from Nasopharyngeal swab specimens and should not be used as a sole basis for treatment. Nasal washings and aspirates are unacceptable for Xpert Xpress SARS-CoV-2/FLU/RSV testing.  Fact Sheet for Patients: EntrepreneurPulse.com.au  Fact Sheet for Healthcare Providers: IncredibleEmployment.be  This test is not yet approved or cleared by the [place] FDA and has been authorized for detection and/or diagnosis of SARS-CoV-2 by FDA under an Emergency Use Authorization (EUA). This EUA will remain in effect (meaning this test can be used) for the duration of the COVID-19 declaration under Section 564(b)(1) of the Act, 21 U.S.C. section 360bbb-3(b)(1), unless the authorization is terminated or revoked.  Performed at [facility], [address] [place], [place] [address]   MRSA Next Gen by PCR, Nasal     Status: None   Collection Time: 04/28/21 11:43 PM   Specimen: Nasal Mucosa; Nasal Swab  Result Value Ref Range Status   MRSA by PCR Next Gen NOT DETECTED NOT DETECTED Final    Comment: (NOTE) The GeneXpert MRSA Assay (FDA approved for NASAL specimens only), is one component of a comprehensive MRSA colonization surveillance program. It is not intended to diagnose MRSA infection nor to guide or monitor treatment for MRSA infections. Test performance is not FDA approved in patients less than 40 years old. Performed at [facility], [place] [address] [place], [place] [address]     Radiology Studies: DG Chest  Port 1 View  Result Date: 04/28/2021 CLINICAL DATA:  Shortness of breath. EXAM: PORTABLE CHEST 1 VIEW COMPARISON:  April 26, 2021. FINDINGS: The heart size and  mediastinal contours are within normal limits. Status post coronary artery bypass graft. Right internal jugular Port-A-Cath is unchanged in position left lung is clear. No pneumothorax is noted. Stable right pleural effusion is noted with associated atelectasis. The visualized skeletal structures are unremarkable. IMPRESSION: Stable mild right pleural effusion with associated right basilar atelectasis or scarring. Electronically Signed   By: Marijo Conception M.D.   On: 04/28/2021 18:33       Chinonso Linker T. Lilburn  If 7PM-7AM, please contact night-coverage [email or web address] 04/29/2021, 2:21 PM

## 2021-04-29 NOTE — TOC Initial Note (Addendum)
Transition of Care [facility]) - Initial/Assessment Note    Patient Details  Name: [name] MRN: [id number] Date of Birth: 05-26-1956  Transition of Care [facility]) CM/SW Contact:    Leeroy Cha, RN Phone Number: 04/29/2021, 7:17 AM  Clinical Narrative:                  65 y.o. 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 fib, CKD 3, treated hepatitis C, recent PE on Xarelto  who presents from outside ED with concerns of sepsis from unknown source.    Patient was seen in ED 8/28 for tachycardia and found to have bilateral subsegmental PE and started on Xarelto.  Today he was at home and was cleaning up a mess than his sick dog made when he suddenly felt very lightheaded.  He then had a pulse ox and saw his heart rate was in the 150s.  He then spoke with his cardiologist who recommended he be seen in the ED.  He reports some shortness of breath with dry cough.  Had nausea but denies any vomiting.  Had an episode of diarrhea today.  Denies any recent sick contacts or fever.   ED Course: He was afebrile but hypotensive down to 80s over 63 with a lactate of 3.6 but no leukocytosis.  Creatinine of 1.26 from a prior of 1.03.  Blood pressure was responsive to IV fluids. Troponin of 4.  Negative COVID and flu PCR test. TOC PLAN OF CARE: following for possible home with hospice, hhc and dme. Progression: wcb 3.3, platelets-112, hgb-7.2/iv abx x3. Iv flds at 75cc/hr., Expected Discharge Plan: Home/Self Care Barriers to Discharge: Continued Medical Work up   Patient Goals and CMS Choice        Expected Discharge Plan and Services Expected Discharge Plan: Home/Self Care   Discharge Planning Services: CM Consult   Living arrangements for the past 2 [place]                                       Prior Living Arrangements/Services Living arrangements for the past 2 months: [place] Lives with:: Spouse Patient language and need for interpreter reviewed:: Yes Do you feel safe going back to the place where you live?: Yes            Criminal Activity/Legal Involvement Pertinent to Current Situation/Hospitalization: No - Comment as needed  Activities of Daily Living      Permission Sought/Granted                  Emotional Assessment Appearance:: Appears stated age     Orientation: : Oriented to Self, Oriented to Place, Oriented to  Time, Oriented to Situation Alcohol / Substance Use: Not Applicable Psych Involvement: No (comment)  Admission diagnosis:  Hypotension due to hypovolemia [I95.89, E86.1] Patient Active Problem List   Diagnosis Date Noted   History of pulmonary embolus (PE) 04/29/2021   Anemia 04/29/2021   Hypotension due to hypovolemia 04/28/2021   Hypomagnesemia 04/08/2021   Port-A-Cath in place 04/07/2021   Protein-calorie malnutrition, severe 03/25/2021   CAP (community acquired pneumonia) 03/23/2021   PNA (pneumonia) 03/22/2021   Community acquired pneumonia 03/22/2021   Sepsis ([place]) 03/22/2021  CKD (chronic kidney disease), stage III ([place]) 03/22/2021   Hypoxia 02/28/2021   Dyspnea 02/28/2021   AKI (acute kidney injury) ([place]) 02/25/2021   AF (paroxysmal atrial fibrillation) ([place]) 02/25/2021   Pleural effusion 02/25/2021   Neutropenia, drug-induced ([place]) 02/24/2021   Positive blood cultures 02/17/2021   Positive blood culture 02/16/2021   Right flank pain 02/09/2021   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[place]) 01/30/2021   Encounter for antineoplastic chemotherapy 01/29/2021   Goals of care, counseling/discussion 01/14/2021   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[place]) 01/14/2021   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   Acute back pain with  sciatica 01/12/2021   Allergic rhinitis 01/12/2021   Amnesia 01/12/2021   Kidney stone 01/12/2021   Sciatica 01/12/2021   Bipolar disorder ([place]) 01/12/2021   S/P lobectomy of lung 12/24/2020   Solitary lung nodule 11/28/2020   Chest pain 11/07/2019   Gastroesophageal reflux disease    Cervical radiculopathy 10/17/2018   Preoperative clearance 10/04/2018   Palpitations    Coronary artery disease involving coronary bypass graft of native heart with angina pectoris (HCC)    Transient loss of consciousness 03/24/2018   Ectopic atrial tachycardia ([place]) 02/09/2018   Central chest pain 03/10/2017   Family hx-stroke 11/10/2016   Stroke-like episode ([place]) - R brain, s/p tPA 11/09/2016   Unstable angina (HCC) 09/07/2016   Claudication of both lower extremities ([place])    Pure hypercholesterolemia    Tobacco abuse disorder    CAD of autologous artery bypass graft without angina    Chest pain at rest 06/10/2016   Abnormal nuclear stress test - HIGH RISK 04/20/2016   Old MI (myocardial infarction)    Essential hypertension 02/26/2016   Ischemic cardiomyopathy 12/25/2015   Hyperlipidemia LDL goal <70 12/25/2015   Mild tobacco abuse in early remission 11/28/2015   Coronary artery disease involving native coronary artery of native heart with angina pectoris ([place]) 11/28/2015   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   Acute MI anterior wall first episode care [facility])    Precordial chest pain 03/07/2015   Gout attack 03/07/2015   Mixed bipolar I disorder ([place]) 03/07/2015   Fibromyalgia 07/09/2014   Gout 07/09/2014   Anxiety 07/09/2014   Depression 07/09/2014   Hepatitis C 01/77/9390   Eosinophilic esophagitis 30/04/2329   PCP:  [name], MD Pharmacy:   [id number] - [place], [address] AT [facility] OF [address] [place] [address] Phone: [phone] Fax: [phone]     Social Determinants of Health (SDOH) Interventions     Readmission Risk Interventions Readmission Risk Prevention Plan 03/25/2021  Transportation Screening Complete  Medication Review ([place]) Complete  PCP or Specialist appointment within 3-5 days of discharge Complete  HRI or [place] Complete  SW Recovery Care/Counseling Consult Complete  Palliative Care Screening Not [place] Not Applicable  Some recent data might be hidden

## 2021-04-30 DIAGNOSIS — C7A8 Other malignant neuroendocrine tumors: Secondary | ICD-10-CM

## 2021-04-30 DIAGNOSIS — R6510 Systemic inflammatory response syndrome (SIRS) of non-infectious origin without acute organ dysfunction: Secondary | ICD-10-CM

## 2021-04-30 LAB — CBC
HCT: 28.6 % — ABNORMAL LOW (ref 39.0–52.0)
Hemoglobin: 9.3 g/dL — ABNORMAL LOW (ref 13.0–17.0)
MCH: 29.3 pg (ref 26.0–34.0)
MCHC: 32.5 g/dL (ref 30.0–36.0)
MCV: 90.2 fL (ref 80.0–100.0)
Platelets: 110 10*3/uL — ABNORMAL LOW (ref 150–400)
RBC: 3.17 MIL/uL — ABNORMAL LOW (ref 4.22–5.81)
RDW: 14.9 % (ref 11.5–15.5)
WBC: 2.8 10*3/uL — ABNORMAL LOW (ref 4.0–10.5)
nRBC: 0 % (ref 0.0–0.2)

## 2021-04-30 LAB — RENAL FUNCTION PANEL
Albumin: 3.3 g/dL — ABNORMAL LOW (ref 3.5–5.0)
Anion gap: 7 (ref 5–15)
BUN: 13 mg/dL (ref 8–23)
CO2: 26 mmol/L (ref 22–32)
Calcium: 9.3 mg/dL (ref 8.9–10.3)
Chloride: 108 mmol/L (ref 98–111)
Creatinine, Ser: 0.8 mg/dL (ref 0.61–1.24)
GFR, Estimated: >60 mL/min (ref >60)
Glucose, Bld: 94 mg/dL (ref 70–99)
Phosphorus: 2.8 mg/dL (ref 2.5–4.6)
Potassium: 3.9 mmol/L (ref 3.5–5.1)
Sodium: 141 mmol/L (ref 135–145)

## 2021-04-30 LAB — TYPE AND SCREEN
ABO/RH(D): O POS
Antibody Screen: NEGATIVE
Unit division: 0

## 2021-04-30 LAB — BPAM RBC
Blood Product Expiration Date: 202210012359
ISSUE DATE / TIME: 202208311342
Unit Type and Rh: 5100

## 2021-04-30 LAB — MAGNESIUM: Magnesium: 1.4 mg/dL — ABNORMAL LOW (ref 1.7–2.4)

## 2021-04-30 MED ORDER — METOPROLOL TARTRATE 25 MG PO TABS
25.0000 mg | ORAL_TABLET | Freq: Two times a day (BID) | ORAL | Status: DC
  Administered 2021-04-30: 25 mg via ORAL
  Filled 2021-04-30: qty 1

## 2021-04-30 MED ORDER — MAGNESIUM SULFATE 4 GM/100ML IV SOLN
4.0000 g | Freq: Once | INTRAVENOUS | Status: AC
  Administered 2021-04-30: 4 g via INTRAVENOUS
  Filled 2021-04-30: qty 100

## 2021-04-30 MED ORDER — CLOPIDOGREL BISULFATE 75 MG PO TABS
75.0000 mg | ORAL_TABLET | Freq: Every day | ORAL | Status: DC
  Administered 2021-04-30: 75 mg via ORAL
  Filled 2021-04-30: qty 1

## 2021-04-30 MED ORDER — SODIUM CHLORIDE 0.9 % IV SOLN: INTRAVENOUS | Status: DC | PRN

## 2021-04-30 MED ORDER — PROCHLORPERAZINE EDISYLATE 10 MG/2ML IJ SOLN
5.0000 mg | Freq: Once | INTRAMUSCULAR | Status: AC
  Administered 2021-04-30: 5 mg via INTRAVENOUS
  Filled 2021-04-30: qty 2

## 2021-04-30 NOTE — Plan of Care (Signed)
  Problem: Fluid Volume: Goal: Hemodynamic stability will improve Outcome: Completed/Met   Problem: Clinical Measurements: Goal: Diagnostic test results will improve Outcome: Completed/Met Goal: Signs and symptoms of infection will decrease Outcome: Completed/Met   Problem: Respiratory: Goal: Ability to maintain adequate ventilation will improve Outcome: Completed/Met   Problem: Education: Goal: Knowledge of General Education information will improve Description: Including pain rating scale, medication(s)/side effects and non-pharmacologic comfort measures Outcome: Completed/Met   Problem: Health Behavior/Discharge Planning: Goal: Ability to manage health-related needs will improve Outcome: Completed/Met   Problem: Clinical Measurements: Goal: Ability to maintain clinical measurements within normal limits will improve Outcome: Completed/Met Goal: Will remain free from infection Outcome: Completed/Met Goal: Diagnostic test results will improve Outcome: Completed/Met Goal: Respiratory complications will improve Outcome: Completed/Met Goal: Cardiovascular complication will be avoided Outcome: Completed/Met   Problem: Activity: Goal: Risk for activity intolerance will decrease Outcome: Completed/Met   Problem: Nutrition: Goal: Adequate nutrition will be maintained Outcome: Completed/Met   Problem: Coping: Goal: Level of anxiety will decrease Outcome: Completed/Met   Problem: Elimination: Goal: Will not experience complications related to bowel motility Outcome: Completed/Met Goal: Will not experience complications related to urinary retention Outcome: Completed/Met   Problem: Pain Managment: Goal: General experience of comfort will improve Outcome: Completed/Met   Problem: Safety: Goal: Ability to remain free from injury will improve Outcome: Completed/Met   Problem: Skin Integrity: Goal: Risk for impaired skin integrity will decrease Outcome:  Completed/Met

## 2021-04-30 NOTE — Progress Notes (Signed)
OT Cancellation Note  Patient Details Name: [name] MRN: [id number] DOB: Aug 16, 1956   Cancelled Treatment:    Reason Eval/Treat Not Completed: OT screened, no needs identified, will sign off. PT reports patient near baseline and has no therapy needs.   Ola Fawver L Makiah Foye 04/30/2021, 10:19 AM

## 2021-04-30 NOTE — Discharge Summary (Signed)
Physician Discharge Summary  [name] LSL:[id number] DOB: 1956/06/04 DOA: 04/28/2021  PCP: [name], MD  Admit date: 04/28/2021 Discharge date: 04/30/2021  Admitted From: Home Disposition: Home  Recommendations for Outpatient Follow-up:  Follow ups as below. Please obtain CBC/BMP/Mag at follow up Please follow up on the following pending results: None  Home Health: Not indicated Equipment/Devices: Not indicated  Discharge Condition: Stable CODE STATUS: Full code   Follow-up Information     [name], MD. Schedule an appointment as soon as possible for a visit in 1 week(s).   Specialty: Family Medicine Contact information: [address] [place] [address] [phone]         [name], MD. Schedule an appointment as soon as possible for a visit in 2 week(s).   Specialties: Cardiology, Radiology, Interventional Cardiology Contact information: 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 fib, recent diagnosis of PE on starter pack Xarelto, and treated hep C presenting with lightheadedness, tachycardia to 150s and shortness of breath, and admitted for hypotension with BP in 80s/60s, AKI and SIRS.  He was also anemic but has no evidence of GI bleed.  Cultures obtained.  Resuscitated with IV fluid and started on broad-spectrum antibiotics.  Cultures remain negative.  Infectious etiology felt to be less likely.  Antibiotics discontinued.  He was transfused 1 unit with appropriate response.    On the day of discharge, patient felt well.  Hemodynamically stable.  H&H remained stable.  Evaluated by therapy and no needles identified.  He was discharged to follow-up with PCP, cardiology and oncology.  See  individual from list below for more on hospital course.  Discharge Diagnoses:  SIRS-likely from dehydration and anemia versus infectious process.  COVID-19 PCR and blood cultures NGTD.  MRSA PCR screen negative.  SIRS resolved except for mild leukopenia.  Sepsis ruled out.  Near syncope/hypotension-likely from dehydration and anemia.  Orthostatic vitals negative.  Transfused 1 unit with appropriate response.  Resolved.     Symptomatic anemia/pancytopenia-likely from chemotherapy.  He is on Xarelto and DAPT but denies melena or hematochezia.  Anemia panel without significant finding.  Transfused 1 unit with appropriate response.  H&H remained stable. Recent Labs    03/30/21 0803 04/01/21 0809 04/07/21 1409 04/13/21 0840 04/20/21 0746 04/26/21 1829 04/28/21 1751 04/29/21 0237 04/29/21 1734 04/30/21 0252  HGB 8.0* 7.2* 10.1* 10.4* 10.0* 9.9* 9.6* 7.2* 8.9* 9.3*  -Recheck CBC at follow-up -Discontinued aspirin given risk for bleeding while on Xarelto and Plavix -Advised to avoid NSAIDs including Aleve.   Recent bilateral subsegmental PE -Continue starter pack Xarelto.   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 Earlie Server.  Last chemo on 8/22. 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  Troponin negative. -Discontinued aspirin given risk for GI bleed. -Continue Toprol XL, Ranexa and Plavix   AKI: Resolved. Recent Labs    03/25/21 0500 03/30/21 0803 04/01/21 0809 04/07/21 1409 04/13/21 0840 04/20/21 0746 04/26/21 1829 04/28/21 1751 04/29/21 0237 04/30/21 0252  BUN 15 15 22 21 17  27* 26* 23 18 13   CREATININE 1.02 1.00  0.92 0.92 0.97 0.96 1.03 1.26* 0.98 0.80  -Recheck renal function in 1 to 2 weeks   Paroxysmal A. fib: In sinus rhythm but tachycardic. -Continue home amiodarone, Toprol-XL and Xarelto   Thrombocytopenia: Likely from  chemotherapy.  Relatively stable. Recent Labs  Lab 04/26/21 1829 04/28/21 1751 04/29/21 0237 04/30/21 0252  PLT 235 174 112* 110*  -Recheck at follow-up  Hypomagnesemia: Mg 1.4. -Received IV magnesium sulfate 4 g prior to discharge -Continue home p.o. magnesium  Body mass index is 25.3 kg/m.            Discharge Exam: Vitals:   04/30/21 0812 04/30/21 0813  BP:    Pulse:    Resp: 20 20  Temp:  98.2 F (36.8 C)  SpO2: 97% 97%    GENERAL: No apparent distress.  Nontoxic. HEENT: MMM.  Vision and hearing grossly intact.  NECK: Supple.  No apparent JVD.  RESP:  No IWOB.  Fair aeration bilaterally. CVS:  RRR. Heart sounds normal.  ABD/GI/GU: Bowel sounds present. Soft. Non tender.  MSK/EXT:  Moves extremities. No apparent deformity. No edema.  SKIN: no apparent skin lesion or wound NEURO: Awake, alert and oriented appropriately.  No apparent focal neuro deficit. PSYCH: Calm. Normal affect.   Discharge Instructions  Discharge Instructions     Call MD for:  difficulty breathing, headache or visual disturbances   Complete by: As directed    Call MD for:  extreme fatigue   Complete by: As directed    Call MD for:  persistant dizziness or light-headedness   Complete by: As directed    Call MD for:  severe uncontrolled pain   Complete by: As directed    Call MD for:  temperature >100.4   Complete by: As directed    Diet - low sodium heart healthy   Complete by: As directed    Discharge instructions   Complete by: As directed    It has been a pleasure taking care of you!  You were hospitalized you were hospitalized with lightheadedness, high heart rate and low blood pressure likely from dehydration and anemia.  The test is we have done did not reveal infection.  Your symptoms improved with hydration and blood transfusion to the point we think it is safe to let you go home and follow-up with your primary care doctor and oncologist.  Please review your new medication  list and the directions on your medications before you take them.  Note that we have stopped your aspirin and Aleve due to high risk for bleeding with Xarelto and Plavix.   Take care,   No wound care   Complete by: As directed       Allergies as of 04/30/2021       Reactions   Prednisone Other (See Comments)   States that this med makes him "crazy"   Tetanus Toxoids Swelling, Other (See Comments)   Fever, Swelling of the arm    Wellbutrin [bupropion] Other (See Comments)   Crazy thoughts, nightmares   Indomethacin Other (See Comments)   rectal bleeding   Other    Other reaction(s): Unknown   Varenicline Other (See Comments)   Dreams Other reaction(s): Unknown Other reaction(s): Unknown        Medication List     STOP taking these medications    aspirin EC 81 MG tablet   naproxen 500 MG tablet Commonly known as: NAPROSYN       TAKE these medications    acetaminophen 500  MG tablet Commonly known as: TYLENOL Take 1,000 mg by mouth every 6 (six) hours as needed for moderate pain.   albuterol 108 (90 Base) MCG/ACT inhaler Commonly known as: VENTOLIN HFA Inhale 2 puffs into the lungs every 6 (six) hours as needed for wheezing or shortness of breath.   amiodarone 200 MG tablet Commonly known as: PACERONE Take 1 tablet (200 mg total) by mouth daily.   amLODipine 10 MG tablet Commonly known as: NORVASC TAKE 1 TABLET(10 MG) BY MOUTH DAILY   atorvastatin 80 MG tablet Commonly known as: LIPITOR TAKE 1 TABLET(80 MG) BY MOUTH DAILY   cloNIDine 0.1 MG tablet Commonly known as: CATAPRES Take 0.1 mg by mouth daily as needed (sleep).   clopidogrel 75 MG tablet Commonly known as: PLAVIX TAKE 1 TABLET BY MOUTH EVERY DAY   FIRST-DUKES MOUTHWASH MT Use as directed 5 mLs in the mouth or throat 4 (four) times daily -  before meals and at bedtime.   lidocaine-prilocaine cream Commonly known as: EMLA Apply 1 application topically once as needed (port access).    magnesium oxide 400 (240 Mg) MG tablet Commonly known as: MAG-OX Take 1 tablet (400 mg total) by mouth daily.   metoprolol succinate 25 MG 24 hr tablet Commonly known as: TOPROL-XL Take 1 tablet (25 mg total) by mouth daily.   nitroGLYCERIN 0.4 MG SL tablet Commonly known as: NITROSTAT PLACE 1 TABLET UNDER THE TONGUE EVERY 5 MINUTES AS NEEDED FOR CHEST PAIN. 3 DOSES MAX   ondansetron 8 MG tablet Commonly known as: ZOFRAN Take 1 tablet (8 mg total) by mouth every 8 (eight) hours as needed for nausea or vomiting. Starting 3 days after chemotherapy   oxyCODONE 5 MG immediate release tablet Commonly known as: Oxy IR/ROXICODONE Take 1 tablet (5 mg total) by mouth every 4 (four) hours as needed for moderate pain.   prochlorperazine 10 MG tablet Commonly known as: COMPAZINE Take 1 tablet (10 mg total) by mouth every 6 (six) hours as needed.   ranolazine 1000 MG SR tablet Commonly known as: RANEXA Take 1 tablet (1,000 mg total) by mouth 2 (two) times daily.   Stiolto Respimat 2.5-2.5 MCG/ACT Aers Generic drug: Tiotropium Bromide-Olodaterol Inhale 2 puffs into the lungs daily.   Xarelto Starter Pack Generic drug: Rivaroxaban Stater Pack (15 mg and 20 mg) Take one 15 mg tablet by mouth 2 (two) times daily. On day 22, switch to one 20 mg tablet once a day. Take with Food.        Consultations: None  Procedures/Studies:   CT Angio Chest PE W/Cm &/Or Wo Cm  Result Date: 04/27/2021 CLINICAL DATA:  Concern for pulmonary embolism. EXAM: CT ANGIOGRAPHY CHEST WITH CONTRAST TECHNIQUE: Multidetector CT imaging of the chest was performed using the standard protocol during bolus administration of intravenous contrast. Multiplanar CT image reconstructions and MIPs were obtained to evaluate the vascular anatomy. CONTRAST:  170mL OMNIPAQUE IOHEXOL 350 MG/ML SOLN COMPARISON:  Chest CT dated 02/28/2021. FINDINGS: Cardiovascular: There is no cardiomegaly or pericardial effusion. Coronary  vascular calcification and postsurgical changes of CABG. There is mild atherosclerotic calcification of the thoracic aorta. No aneurysmal dilatation or dissection. The origins of the great vessels of the aortic arch appear patent as visualized. Evaluation of the pulmonary arteries is limited due to respiratory motion artifact. There is linear nonocclusive thrombus within the lobar branches of the left lower lobe and lingula extending into the segmental pulmonary artery branches, age indeterminate. Correlation with history of recent PE recommended. Nonocclusive  thrombus is also noted in the segmental branch of the right lower lobe (121/7). These findings however are new since the prior CT of 02/28/2021. Mediastinum/Nodes: No hilar or mediastinal adenopathy. The esophagus is grossly unremarkable. No mediastinal fluid collection. Lungs/Pleura: Similar appearance of small right pleural effusion with partial compressive atelectasis of the right lower lobe. Pneumonia is not excluded clinical correlation is recommended. Mild thickening of the bronchial wall of the right lower lobe. No focal consolidation or pneumothorax. The central airways remain patent. Postsurgical changes of the right lung as seen previously. Upper Abdomen: No acute abnormality. Musculoskeletal: Multilevel degenerative changes with anterior osteophyte/spurring. C7-T1 ACDF. Median sternotomy wires. No acute osseous pathology. Old healed right lateral rib fracture. Review of the MIP images confirms the above findings. IMPRESSION: 1. Nonocclusive pulmonary emboli within the lobar branches of the left lower lobe and lingula extending into the segmental pulmonary artery branches, age indeterminate, but new since the prior CT. Nonocclusive thrombus is also noted in the segmental branch of the right lower lobe. No CT evidence of right heart straining. 2. Similar appearance of small right pleural effusion with partial compressive atelectasis of the right lower  lobe. Pneumonia is not excluded clinical correlation is recommended. 3. Aortic Atherosclerosis (ICD10-I70.0). These results were called by telephone at the time of interpretation on 04/27/2021 at 1:52 am to Dr. Florina Ou, who verbally acknowledged these results. Electronically Signed   By: Anner Crete M.D.   On: 04/27/2021 02:02   DG Chest Port 1 View  Result Date: 04/28/2021 CLINICAL DATA:  Shortness of breath. EXAM: PORTABLE CHEST 1 VIEW COMPARISON:  April 26, 2021. FINDINGS: The heart size and mediastinal contours are within normal limits. Status post coronary artery bypass graft. Right internal jugular Port-A-Cath is unchanged in position left lung is clear. No pneumothorax is noted. Stable right pleural effusion is noted with associated atelectasis. The visualized skeletal structures are unremarkable. IMPRESSION: Stable mild right pleural effusion with associated right basilar atelectasis or scarring. Electronically Signed   By: Marijo Conception M.D.   On: 04/28/2021 18:33   DG Chest Port 1 View  Result Date: 04/26/2021 CLINICAL DATA:  Status post chemo last week for lung cancer. Presents with shortness of breath and rapid heart rate along with right-sided chest pain and dizziness. EXAM: PORTABLE CHEST 1 VIEW COMPARISON:  March 22, 2021 FINDINGS: There is stable right-sided venous Port-A-Cath positioning. Multiple sternal wires and vascular clips are noted. Mild atelectasis and/or scarring is seen within the right lung base. There is a small right pleural effusion. No pneumothorax is identified. The heart size and mediastinal contours are within normal limits. A radiopaque fusion plate and screws are seen overlying the lower cervical spine. The visualized skeletal structures are otherwise unremarkable. IMPRESSION: Mild right basilar atelectasis and/or scarring with a small right pleural effusion. Electronically Signed   By: Virgina Norfolk M.D.   On: 04/26/2021 18:21       The results of  significant diagnostics from this hospitalization (including imaging, microbiology, ancillary and laboratory) are listed below for reference.     Microbiology: Recent Results (from the past 240 hour(s))  Culture, blood (routine x 2)     Status: None (Preliminary result)   Collection Time: 04/28/21  6:02 PM   Specimen: BLOOD  Result Value Ref Range Status   Specimen Description   Final    BLOOD RIGHT ANTECUBITAL Performed at [facility], [address], [place], [place] [address]    Special Requests  Final    BOTTLES DRAWN AEROBIC AND ANAEROBIC Blood Culture adequate volume Performed at [facility], [address] [place], [place] [address]    Culture   Final    NO GROWTH 2 DAYS Performed at [facility], [place] [address] [place], [place] [address]    Report Status PENDING  Incomplete  Culture, blood (routine x 2)     Status: None (Preliminary result)   Collection Time: 04/28/21  6:18 PM   Specimen: BLOOD  Result Value Ref Range Status   Specimen Description   Final    BLOOD BLOOD LEFT HAND Performed at [facility], [address] [place], [place] [address]    Special Requests   Final    BOTTLES DRAWN AEROBIC AND ANAEROBIC Blood Culture adequate volume Performed at [facility], [address] [place], [place] [address]    Culture   Final    NO GROWTH 2 DAYS Performed at [facility], [place] [address], [place], [place] [address]    Report Status PENDING  Incomplete  Resp Panel by RT-PCR (Flu A&B, Covid) Nasopharyngeal Swab     Status: None   Collection Time: 04/28/21  7:00 PM   Specimen: Nasopharyngeal Swab; Nasopharyngeal(NP) swabs in vial transport medium  Result Value Ref Range Status   SARS Coronavirus 2 by RT PCR NEGATIVE NEGATIVE Final    Comment: (NOTE) SARS-CoV-2 target nucleic acids are NOT DETECTED.  The SARS-CoV-2 RNA is generally detectable in upper respiratory specimens during the acute phase of  infection. The lowest concentration of SARS-CoV-2 viral copies this assay can detect is 138 copies/mL. A negative result does not preclude SARS-Cov-2 infection and should not be used as the sole basis for treatment or other patient management decisions. A negative result may occur with  improper specimen collection/handling, submission of specimen other than nasopharyngeal swab, presence of viral mutation(s) within the areas targeted by this assay, and inadequate number of viral copies(<138 copies/mL). A negative result must be combined with clinical observations, patient history, and epidemiological information. The expected result is Negative.  Fact Sheet for Patients:  EntrepreneurPulse.com.au  Fact Sheet for Healthcare Providers:  IncredibleEmployment.be  This test is no t yet approved or cleared by the [place] FDA and  has been authorized for detection and/or diagnosis of SARS-CoV-2 by FDA under an Emergency Use Authorization (EUA). This EUA will remain  in effect (meaning this test can be used) for the duration of the COVID-19 declaration under Section 564(b)(1) of the Act, 21 U.S.C.section 360bbb-3(b)(1), unless the authorization is terminated  or revoked sooner.       Influenza A by PCR NEGATIVE NEGATIVE Final   Influenza B by PCR NEGATIVE NEGATIVE Final    Comment: (NOTE) The Xpert Xpress SARS-CoV-2/FLU/RSV plus assay is intended as an aid in the diagnosis of influenza from Nasopharyngeal swab specimens and should not be used as a sole basis for treatment. Nasal washings and aspirates are unacceptable for Xpert Xpress SARS-CoV-2/FLU/RSV testing.  Fact Sheet for Patients: EntrepreneurPulse.com.au  Fact Sheet for Healthcare Providers: IncredibleEmployment.be  This test is not yet approved or cleared by the [place] FDA and has been authorized for detection and/or diagnosis of SARS-CoV-2  by FDA under an Emergency Use Authorization (EUA). This EUA will remain in effect (meaning this test can be used) for the duration of the COVID-19 declaration under Section 564(b)(1) of the Act, 21 U.S.C. section 360bbb-3(b)(1), unless the authorization is  terminated or revoked.  Performed at [facility], [address] [place], [place] [address]   MRSA Next Gen by PCR, Nasal     Status: None   Collection Time: 04/28/21 11:43 PM   Specimen: Nasal Mucosa; Nasal Swab  Result Value Ref Range Status   MRSA by PCR Next Gen NOT DETECTED NOT DETECTED Final    Comment: (NOTE) The GeneXpert MRSA Assay (FDA approved for NASAL specimens only), is one component of a comprehensive MRSA colonization surveillance program. It is not intended to diagnose MRSA infection nor to guide or monitor treatment for MRSA infections. Test performance is not FDA approved in patients less than 65 years old. Performed at [facility], [place] [address], [place], [place] [address]      Labs:  CBC: Recent Labs  Lab 04/26/21 1829 04/28/21 1751 04/29/21 0237 04/29/21 1734 04/30/21 0252  WBC 35.4* 5.2 3.3*  --  2.8*  NEUTROABS 33.3* 2.8  --   --   --   HGB 9.9* 9.6* 7.2* 8.9* 9.3*  HCT 29.5* 29.8* 22.3* 26.7* 28.6*  MCV 89.9 91.4 91.4  --  90.2  PLT 235 174 112*  --  110*   BMP &GFR Recent Labs  Lab 04/26/21 1829 04/28/21 1751 04/29/21 0237 04/30/21 0252  NA 136 137 140 141  K 4.0 4.0 3.8 3.9  CL 100 101 109 108  CO2 27 24 24 26   GLUCOSE 88 111* 93 94  BUN 26* 23 18 13   CREATININE 1.03 1.26* 0.98 0.80  CALCIUM 8.9 9.4 8.5* 9.3  MG  --   --   --  1.4*  PHOS  --   --   --  2.8   Estimated Creatinine Clearance: 90.8 mL/min (by C-G formula based on SCr of 0.8 mg/dL). Liver & Pancreas: Recent Labs  Lab 04/26/21 1829 04/28/21 1751 04/30/21 0252  AST 18 17  --   ALT 14 14  --   ALKPHOS 106 125  --   BILITOT 0.3 0.7  --   PROT 6.5 7.4  --   ALBUMIN 3.4* 3.9  3.3*   Recent Labs  Lab 04/28/21 1751  LIPASE 19   No results for input(s): AMMONIA in the last 168 hours. Diabetic: No results for input(s): HGBA1C in the last 72 hours. No results for input(s): GLUCAP in the last 168 hours. Cardiac Enzymes: No results for input(s): CKTOTAL, CKMB, CKMBINDEX, TROPONINI in the last 168 hours. No results for input(s): PROBNP in the last 8760 hours. Coagulation Profile: Recent Labs  Lab 04/26/21 1829 04/28/21 1751  INR 1.0 2.1*   Thyroid Function Tests: No results for input(s): TSH, T4TOTAL, FREET4, T3FREE, THYROIDAB in the last 72 hours. Lipid Profile: No results for input(s): CHOL, HDL, LDLCALC, TRIG, CHOLHDL, LDLDIRECT in the last 72 hours. Anemia Panel: Recent Labs    04/29/21 0806  VITAMINB12 1,133*  FOLATE 6.8  FERRITIN 892*  TIBC 259  IRON 42*  RETICCTPCT <0.4*   Urine analysis:    Component Value Date/Time   COLORURINE YELLOW 04/26/2021 2053   APPEARANCEUR CLEAR 04/26/2021 2053   LABSPEC 1.015 04/26/2021 2053   PHURINE 7.0 04/26/2021 2053   GLUCOSEU NEGATIVE 04/26/2021 2053   HGBUR NEGATIVE 04/26/2021 2053   BILIRUBINUR NEGATIVE 04/26/2021 2053   KETONESUR NEGATIVE 04/26/2021 2053   PROTEINUR NEGATIVE 04/26/2021 2053   UROBILINOGEN 0.2 05/13/2014 0658   NITRITE NEGATIVE 04/26/2021 2053   LEUKOCYTESUR NEGATIVE 04/26/2021 2053   Sepsis Labs: Invalid input(s):  PROCALCITONIN, LACTICIDVEN   Time coordinating discharge: 45 minutes  SIGNED:  Mercy Riding, MD  [facility] 04/30/2021, 3:39 PM  If 7PM-7AM, please contact night-coverage [email or web address]

## 2021-04-30 NOTE — Evaluation (Signed)
Physical Therapy Evaluation Patient Details Name: [name] MRN: [id number] DOB: [birth year] Today's Date: 04/30/2021   History of Present Illness  [name] is a 65 y.o. 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 fib, CKD 3, treated hepatitis C, recent PE on Xarelto  who presents from outside ED with concerns of sepsis from unknown source.  Patient was seen in ED 8/28 for tachycardia and found to have bilateral subsegmental PE and started on Xarelto.  Found to be tachycardic and hypotensive.  Clinical Impression  Patient presents with mobility not far from baseline.  Reports generally weak from chemo and hopes to get back into something even walking up the street a little.  Encouraged to check with MD first and offerred to ask MD about outpatient cancer rehab if cleared and through with chemo.  Patient stable for home with intermittent family support.  No further skilled PT needs at this time.  Note HR max 105 with ambulation and stairs and SpO2 100%.    Follow Up Recommendations No PT follow up    Equipment Recommendations  None recommended by PT    Recommendations for Other Services       Precautions / Restrictions Precautions Precautions: Fall      Mobility  Bed Mobility Overal bed mobility: Modified Independent                  Transfers Overall transfer level: Modified independent                  Ambulation/Gait Ambulation/Gait assistance: Supervision;Min guard Gait Distance (Feet): 400 Feet Assistive device: None Gait Pattern/deviations: Step-through pattern;Decreased stride length;Staggering right     General Gait Details: Initially ambulating without assist, then LOB to R when looking L and staggered into nurses desk minguard for safety, but pt stopped and  reset and begain again with better attention  Stairs Stairs: Yes Stairs assistance: Supervision;Min guard Stair Management: Forwards;One rail Left;Alternating pattern Number of Stairs: 6 General stair comments: limited by IV and telemetry,  performed 3 steps x 2  Wheelchair Mobility    Modified Rankin (Stroke Patients Only)       Balance Overall balance assessment: Mild deficits observed, not formally tested                                           Pertinent Vitals/Pain Pain Assessment: No/denies pain    Home Living Family/patient expects to be discharged to:: Private residence Living Arrangements: Spouse/significant other Available Help at Discharge: Family Type of Home: House Home Access: Stairs to enter Entrance Stairs-Rails: Psychiatric nurse of Steps: 6 Home Layout: Two level;Bed/bath upstairs Home Equipment: None      Prior Function Level of Independence: Independent         Comments: some decreased endurance since chemo     Hand Dominance        Extremity/Trunk Assessment        Lower Extremity Assessment Lower Extremity Assessment: Overall WFL for tasks assessed    Cervical / Trunk Assessment Cervical / Trunk Assessment: Kyphotic  Communication   Communication: No difficulties  Cognition Arousal/Alertness: Awake/alert Behavior During Therapy: WFL for tasks assessed/performed Overall Cognitive Status: Within Functional Limits for tasks assessed  General Comments General comments (skin integrity, edema, etc.): HR max 105, SpO2 100%, c/o SOB near end of ambulation after stair negotiation.  Educated on fall prevention as reports some mild numbness in feet.  Reviewed footwear, nonslip surfaces in and out of shower, seat if needed in shower, lighting, clear pathways and use urinal if feeling unsteady for nightime toileting.  Also educated in sit to stand for  LE strength, using steps for strength forward and side stepping as well as standing balance at counter SLS.    Exercises     Assessment/Plan    PT Assessment Patent does not need any further PT services  PT Problem List         PT Treatment Interventions      PT Goals (Current goals can be found in the Care Plan section)  Acute Rehab PT Goals PT Goal Formulation: All assessment and education complete, DC therapy    Frequency     Barriers to discharge        Co-evaluation               AM-PAC PT "6 Clicks" Mobility  Outcome Measure Help needed turning from your back to your side while in a flat bed without using bedrails?: None Help needed moving from lying on your back to sitting on the side of a flat bed without using bedrails?: None Help needed moving to and from a bed to a chair (including a wheelchair)?: None Help needed standing up from a chair using your arms (e.g., wheelchair or bedside chair)?: None Help needed to walk in hospital room?: A Little Help needed climbing 3-5 steps with a railing? : A Little 6 Click Score: 22    End of Session   Activity Tolerance: Patient tolerated treatment well Patient left: in bed;with call bell/phone within reach Nurse Communication: Mobility status PT Visit Diagnosis: Muscle weakness (generalized) (M62.81)    Time: 1655-3748 PT Time Calculation (min) (ACUTE ONLY): 21 min   Charges:   PT Evaluation $PT Eval Moderate Complexity: 1 Mod          Cyndi Jessa Stinson, PT Acute Rehabilitation Services Pager:[phone] Office:[phone] 04/30/2021   Reginia Naas 04/30/2021, 10:02 AM

## 2021-04-30 NOTE — Telephone Encounter (Signed)
Patient in hospital.

## 2021-05-01 ENCOUNTER — Telehealth: Payer: Self-pay | Admitting: Interventional Cardiology

## 2021-05-01 NOTE — Telephone Encounter (Signed)
Amiodarone and Xarelto can increase bleeding risk, however he needs to stay on Xarleto due to recent PE.  Recommend continuing current mediations until patient can be seen by cardiology or A Fib clinic

## 2021-05-01 NOTE — Telephone Encounter (Signed)
Pt c/o medication issue:  1. Name of Medication:  RIVAROXABAN (XARELTO) VTE STARTER PACK (15 & 20 MG) amiodarone (PACERONE) 200 MG tablet amLODipine (NORVASC) 10 MG tablet  2. How are you currently taking this medication (dosage and times per day)?   3. Are you having a reaction (difficulty breathing--STAT)?  No   4. What is your medication issue?   Patient states he is aware he needs to take Xarelto, but is unsure whether or not he needs to be taking amiodarone and amlodipine as well. He is also inquiring whether or not he should be taking Lisinopril--he states he has a bottle, but he doesn't see it on his medication list. Please advise.

## 2021-05-01 NOTE — Telephone Encounter (Signed)
Reviewed pt's chart and appears Lisinopril fell off med list sometime between March  and July but pt has been taking all along and the last 4 weeks B/P has been running in the 120 range /80-85  In addition pt was discharged from hospital  on Amlodipine ,Amiodarone and Xarelto and was instructed to call and make sure it was okay to take this combination of  medicines Wil forward to Dr Irish Lack and PharmD to review .Adonis Housekeeper

## 2021-05-03 LAB — CULTURE, BLOOD (ROUTINE X 2)
Culture: NO GROWTH
Culture: NO GROWTH
Special Requests: ADEQUATE
Special Requests: ADEQUATE

## 2021-05-04 ENCOUNTER — Other Ambulatory Visit: Payer: Self-pay

## 2021-05-04 ENCOUNTER — Emergency Department (HOSPITAL_BASED_OUTPATIENT_CLINIC_OR_DEPARTMENT_OTHER): Payer: Medicare Other

## 2021-05-04 ENCOUNTER — Emergency Department (HOSPITAL_BASED_OUTPATIENT_CLINIC_OR_DEPARTMENT_OTHER)
Admission: EM | Admit: 2021-05-04 | Discharge: 2021-05-05 | Disposition: A | Discharge status: Home / Self Care | Payer: Medicare Other | Attending: Emergency Medicine | Admitting: Emergency Medicine

## 2021-05-04 DIAGNOSIS — I25119 Atherosclerotic heart disease of native coronary artery with unspecified angina pectoris: Secondary | ICD-10-CM | POA: Diagnosis not present

## 2021-05-04 DIAGNOSIS — I129 Hypertensive chronic kidney disease with stage 1 through stage 4 chronic kidney disease, or unspecified chronic kidney disease: Secondary | ICD-10-CM | POA: Insufficient documentation

## 2021-05-04 DIAGNOSIS — Z8589 Personal history of malignant neoplasm of other organs and systems: Secondary | ICD-10-CM | POA: Diagnosis not present

## 2021-05-04 DIAGNOSIS — R5383 Other fatigue: Secondary | ICD-10-CM | POA: Insufficient documentation

## 2021-05-04 DIAGNOSIS — Z86011 Personal history of benign neoplasm of the brain: Secondary | ICD-10-CM | POA: Diagnosis not present

## 2021-05-04 DIAGNOSIS — Z87891 Personal history of nicotine dependence: Secondary | ICD-10-CM | POA: Diagnosis not present

## 2021-05-04 DIAGNOSIS — R059 Cough, unspecified: Secondary | ICD-10-CM | POA: Insufficient documentation

## 2021-05-04 DIAGNOSIS — Z7901 Long term (current) use of anticoagulants: Secondary | ICD-10-CM | POA: Diagnosis not present

## 2021-05-04 DIAGNOSIS — Z79899 Other long term (current) drug therapy: Secondary | ICD-10-CM | POA: Insufficient documentation

## 2021-05-04 DIAGNOSIS — Z85118 Personal history of other malignant neoplasm of bronchus and lung: Secondary | ICD-10-CM | POA: Insufficient documentation

## 2021-05-04 DIAGNOSIS — Z955 Presence of coronary angioplasty implant and graft: Secondary | ICD-10-CM | POA: Diagnosis not present

## 2021-05-04 DIAGNOSIS — Z85828 Personal history of other malignant neoplasm of skin: Secondary | ICD-10-CM | POA: Diagnosis not present

## 2021-05-04 DIAGNOSIS — N183 Chronic kidney disease, stage 3 unspecified: Secondary | ICD-10-CM | POA: Insufficient documentation

## 2021-05-04 DIAGNOSIS — R1033 Periumbilical pain: Secondary | ICD-10-CM | POA: Diagnosis not present

## 2021-05-04 DIAGNOSIS — R040 Epistaxis: Secondary | ICD-10-CM | POA: Insufficient documentation

## 2021-05-04 DIAGNOSIS — R071 Chest pain on breathing: Secondary | ICD-10-CM | POA: Diagnosis not present

## 2021-05-04 DIAGNOSIS — R5381 Other malaise: Secondary | ICD-10-CM | POA: Insufficient documentation

## 2021-05-04 DIAGNOSIS — R110 Nausea: Secondary | ICD-10-CM | POA: Diagnosis not present

## 2021-05-04 MED ORDER — SODIUM CHLORIDE 0.9 % IV BOLUS
500.0000 mL | Freq: Once | INTRAVENOUS | Status: AC
  Administered 2021-05-05: 500 mL via INTRAVENOUS

## 2021-05-04 MED ORDER — ONDANSETRON HCL 4 MG/2ML IJ SOLN
4.0000 mg | Freq: Once | INTRAMUSCULAR | Status: AC
  Administered 2021-05-05: 4 mg via INTRAVENOUS
  Filled 2021-05-04: qty 2

## 2021-05-04 MED ORDER — FENTANYL CITRATE PF 50 MCG/ML IJ SOSY
50.0000 ug | PREFILLED_SYRINGE | Freq: Once | INTRAMUSCULAR | Status: AC
  Administered 2021-05-05: 50 ug via INTRAVENOUS
  Filled 2021-05-04: qty 1

## 2021-05-04 MED ORDER — OXYMETAZOLINE HCL 0.05 % NA SOLN
1.0000 | Freq: Once | NASAL | Status: AC
  Administered 2021-05-05: 1 via NASAL
  Filled 2021-05-04: qty 30

## 2021-05-04 NOTE — ED Triage Notes (Signed)
Pt. Reports the nose bleed started 2 days ago and he is feeling nauseated for 2 hours now.  Pt. Reports he vomited up something earlier that looked like congealed blood.  Pt. Also reports earlier when he went poop it was a blackish green color.  Pt. Reports to RN he takes no iron.  Pt. Is a lung CA Pt. And is being treated for this cancer.

## 2021-05-04 NOTE — ED Provider Notes (Signed)
[facility] EMERGENCY DEPARTMENT Provider Note   CSN: [id number] Arrival date & time: 05/04/21  2254     History Chief Complaint  Patient presents with   Abdominal Pain    [name] is a 65 y.o. male.  The history is provided by the patient and medical records.  Abdominal Pain [name] is a 65 y.o. male who presents to the Emergency Department complaining of malaise. 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 Last session was two weeks ago. He also was recently admitted to the hospital for PE and started on Xarelto. Over the last two days he has been feeling unwell with malaise, fatigue. He has associated nausea. Today he vomited twice with gross blood in his emesis (described as a clot about the size of a quarter/half dollar). He has associated central abdominal pain for one week. He also reports two days of right-sided epistaxis that is intermittent in nature. It is coming at the right nare. This morning he woke up with blood coming out of the right nare. Yesterday he blew his nose and blood came out. His epistaxis episodes last between five minutes in an hour. No prior history of epistaxis. His abdominal pain is described as central and mild initially. His abdominal pain became worse after vomiting earlier today. He also reports associated pain in his lungs, which is been present for several days.  Has cough that started today.  No known sick contacts.   No fever, difficulty breathing, diarrhea. He did have dark green stool today.   Takes xarelto.  Takes plavix.  No hx/o bleeding ulcers.  Dr hung with GI - endoscopy on June 3 - esophagitis.    Past Medical History:  Diagnosis Date   Anxiety    Arthritis    Basal cell carcinoma (BCC) of forehead    CAD (coronary artery disease)    a. 10/2015 ant STEMI >> LHC with 3 v CAD; oLAD tx with POBA >> emergent CABG. b. Multiple evals since that  time, early graft failure of SVG-RCA by cath 03/2016. c. 2/19 PCI/DES x1 to pRCA, normal EF.   Carotid artery disease ([place])    a. 40-59% BICA 02/2018.   Depression    Dyspnea    Ectopic atrial tachycardia (HCC)    Esophageal reflux    eosinophil esophagitis   Family history of adverse reaction to anesthesia    "sister has PONV" (06/21/2017)   Former tobacco use    Gout    Hepatitis C    "treated and cured" (06/21/2017)   High cholesterol    History of kidney stones    Hypertension    Ischemic cardiomyopathy    a. EF 25-30% at intraop TEE 4/17  //  b. Limited Echo 5/17 - EF 45-50%, mild ant HK. c. EF 55-65% by cath 09/2017.   Migraine    "3-4/yr" (06/21/2017)   Myocardial infarction ([place]) 10/2015   Palpitations    Sinus bradycardia    a. HR dropping into 40s in 02/2016 -> BB reduced.   Stroke [facility]) 10/2016   "small one; sometimes my memory/cognitive issues" (06/21/2017)   Symptomatic hypotension    a. 02/2016 ER visit -> meds reduced.   Syncope    Wears dentures    Wears glasses     Patient Active Problem List   Diagnosis Date Noted   History of pulmonary embolus (PE) 04/29/2021   Anemia  04/29/2021   Hypotension due to hypovolemia 04/28/2021   Hypomagnesemia 04/08/2021   Port-A-Cath in place 04/07/2021   Protein-calorie malnutrition, severe 03/25/2021   CAP (community acquired pneumonia) 03/23/2021   PNA (pneumonia) 03/22/2021   Community acquired pneumonia 03/22/2021   Sepsis ([place]) 03/22/2021   CKD (chronic kidney disease), stage III ([place]) 03/22/2021   Hypoxia 02/28/2021   Dyspnea 02/28/2021   AKI (acute kidney injury) ([place]) 02/25/2021   AF (paroxysmal atrial fibrillation) ([place]) 02/25/2021   Pleural effusion 02/25/2021   Neutropenia, drug-induced ([place]) 02/24/2021   Positive blood cultures 02/17/2021   Positive blood culture 02/16/2021   Right flank pain 02/09/2021   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[place]) 01/30/2021   Encounter for antineoplastic chemotherapy  01/29/2021   Goals of care, counseling/discussion 01/14/2021   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[place]) 01/14/2021   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   Acute back pain with sciatica 01/12/2021   Allergic rhinitis 01/12/2021   Amnesia 01/12/2021   Kidney stone 01/12/2021   Sciatica 01/12/2021   Bipolar disorder ([place]) 01/12/2021   S/P lobectomy of lung 12/24/2020   Solitary lung nodule 11/28/2020   Chest pain 11/07/2019   Gastroesophageal reflux disease    Cervical radiculopathy 10/17/2018   Preoperative clearance 10/04/2018   Palpitations    Coronary artery disease involving coronary bypass graft of native heart with angina pectoris ([place])    Transient loss of consciousness 03/24/2018   Ectopic atrial tachycardia ([place]) 02/09/2018   Central chest pain 03/10/2017   Family hx-stroke 11/10/2016   Stroke-like episode ([place]) - R brain, s/p tPA 11/09/2016   Unstable angina ([place]) 09/07/2016   Claudication of both lower extremities ([place])    Pure hypercholesterolemia    Tobacco abuse disorder    CAD of autologous artery bypass graft without angina    Chest pain at rest 06/10/2016   Abnormal nuclear stress test - HIGH RISK 04/20/2016   Old MI (myocardial infarction)    Essential hypertension 02/26/2016   Ischemic cardiomyopathy 12/25/2015   Hyperlipidemia LDL goal <70 12/25/2015   Mild tobacco abuse in early remission 11/28/2015   Coronary artery disease involving native coronary artery of native heart with angina pectoris ([place]) 11/28/2015   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   Acute MI anterior wall first episode care [facility])    Precordial chest pain 03/07/2015   Gout attack 03/07/2015   Mixed bipolar I disorder ([place]) 03/07/2015   Fibromyalgia 07/09/2014   Gout 07/09/2014   Anxiety 07/09/2014   Depression 07/09/2014   Hepatitis C 70/26/3785   Eosinophilic esophagitis 88/50/2774    Past Surgical History:  Procedure Laterality Date   25 GAUGE PARS PLANA  VITRECTOMY WITH 20 GAUGE MVR PORT  12/31/2020   Procedure: 25 GAUGE PARS PLANA VITRECTOMY WITH 20 GAUGE MVR PORT;  Surgeon: [name], MD;  Location: [facility];  Service: Thoracic;;   ANTERIOR CERVICAL DECOMP/DISCECTOMY FUSION N/A 10/17/2018   Procedure: Anterior Cervical Decompression Fusion - Cervical seven -Thoracic one;  Surgeon: Consuella Lose, MD;  Location: [facility];  Service: Neurosurgery;  Laterality: N/A;   BASAL CELL CARCINOMA EXCISION     "forehead   BIOPSY  07/20/2019   Procedure: BIOPSY;  Surgeon: Carol Ada, MD;  Location: [facility];  Service: Endoscopy;;   BRONCHIAL BIOPSY  12/24/2020   Procedure: BRONCHIAL BIOPSIES;  Surgeon: Garner Nash, DO;  Location: [facility] ENDOSCOPY;  Service: Pulmonary;;   BRONCHIAL BRUSHINGS  12/24/2020   Procedure: BRONCHIAL BRUSHINGS;  Surgeon: Garner Nash,  DO;  Location: [facility] ENDOSCOPY;  Service: Pulmonary;;   BRONCHIAL NEEDLE ASPIRATION BIOPSY  12/24/2020   Procedure: BRONCHIAL NEEDLE ASPIRATION BIOPSIES;  Surgeon: Garner Nash, DO;  Location: [facility];  Service: Pulmonary;;   CARDIAC CATHETERIZATION N/A 11/28/2015   Procedure: Left Heart Cath and Coronary Angiography;  Surgeon: [name], MD;  Location: [facility] CV LAB;  Service: Cardiovascular;  Laterality: N/A;   CARDIAC CATHETERIZATION N/A 11/28/2015   Procedure: Coronary Balloon Angioplasty;  Surgeon: [name], MD;  Location: [facility] CV LAB;  Service: Cardiovascular;  Laterality: N/A;  ostial LAD   CARDIAC CATHETERIZATION N/A 11/28/2015   Procedure: Coronary/Graft Angiography;  Surgeon: [name], MD;  Location: [facility] CV LAB;  Service: Cardiovascular;  Laterality: N/A;  coronaries only    CARDIAC CATHETERIZATION N/A 04/21/2016   Procedure: Left Heart Cath and Coronary Angiography;  Surgeon: Wellington Hampshire, MD;  Location: [facility] CV LAB;  Service: Cardiovascular;  Laterality: N/A;   CARDIAC CATHETERIZATION N/A 06/14/2016    Procedure: Left Heart Cath and Cors/Grafts Angiography;  Surgeon: Lorretta Harp, MD;  Location: [facility] CV LAB;  Service: Cardiovascular;  Laterality: N/A;   CARDIAC CATHETERIZATION N/A 09/08/2016   Procedure: Left Heart Cath and Cors/Grafts Angiography;  Surgeon: Wellington Hampshire, MD;  Location: [facility] CV LAB;  Service: Cardiovascular;  Laterality: N/A;   CARDIAC CATHETERIZATION     CORONARY ARTERY BYPASS GRAFT N/A 11/28/2015   Procedure: CORONARY ARTERY BYPASS GRAFTING (CABG) TIMES FIVE USING LEFT INTERNAL MAMMARY ARTERY AND RIGHT GREATER SAPHENOUS,VIEN HARVEATED BY ENDOVIEN, INTRAOPPRATIVE TEE;  Surgeon: Gaye Pollack, MD;  Location: [facility];  Service: Open Heart Surgery;  Laterality: N/A;   CORONARY STENT INTERVENTION N/A 10/05/2017   Procedure: CORONARY STENT INTERVENTION;  Surgeon: [name], MD;  Location: [place] CV LAB;  Service: Cardiovascular;  Laterality: N/A;   ESOPHAGOGASTRODUODENOSCOPY (EGD) WITH PROPOFOL N/A 07/20/2019   Procedure: ESOPHAGOGASTRODUODENOSCOPY (EGD) WITH PROPOFOL;  Surgeon: Carol Ada, MD;  [facility];  Service: Endoscopy;  Laterality: N/A;   ESOPHAGOGASTRODUODENOSCOPY (EGD) WITH PROPOFOL N/A 01/30/2021   Procedure: ESOPHAGOGASTRODUODENOSCOPY (EGD) WITH PROPOFOL;  Surgeon: Carol Ada, MD;  [facility];  Service: Endoscopy;  Laterality: N/A;   FIDUCIAL MARKER PLACEMENT  12/24/2020   Procedure: FIDUCIAL DYE MARKER PLACEMENT;  Surgeon: Garner Nash, DO;  Location: [facility] ENDOSCOPY;  Service: Pulmonary;;   HUMERUS SURGERY Right 1969   "tumor inside bone; filled it w/bone chips"   INTERCOSTAL NERVE BLOCK Right 12/24/2020   Procedure: INTERCOSTAL NERVE BLOCK;  Surgeon: [name], MD;  Location: [facility];  Service: Thoracic;  Laterality: Right;   IR IMAGING GUIDED PORT INSERTION  02/06/2021   LEFT HEART CATH AND CORS/GRAFTS ANGIOGRAPHY N/A 03/11/2017   Procedure: Left Heart Cath and Cors/Grafts Angiography;  Surgeon:  Leonie Man, MD;  Location: [facility] CV LAB;  Service: Cardiovascular;  Laterality: N/A;   LEFT HEART CATH AND CORS/GRAFTS ANGIOGRAPHY N/A 10/05/2017   Procedure: LEFT HEART CATH AND CORS/GRAFTS ANGIOGRAPHY;  Surgeon: [name], MD;  Location: [facility] CV LAB;  Service: Cardiovascular;  Laterality: N/A;   LEFT HEART CATH AND CORS/GRAFTS ANGIOGRAPHY N/A 04/11/2019   Procedure: LEFT HEART CATH AND CORS/GRAFTS ANGIOGRAPHY;  Surgeon: [name], MD;  Location: [facility] LAB;  Service: Cardiovascular;  Laterality: N/A;   NODE DISSECTION Right 12/24/2020   Procedure: NODE DISSECTION;  Surgeon: [name], MD;  Location: [facility];  Service: Thoracic;  Laterality:  Right;   PERIPHERAL VASCULAR CATHETERIZATION N/A 06/14/2016   Procedure: Lower Extremity Angiography;  Surgeon: Lorretta Harp, MD;  Location: [facility] CV LAB;  Service: Cardiovascular;  Laterality: N/A;   VIDEO BRONCHOSCOPY WITH ENDOBRONCHIAL NAVIGATION Right 12/24/2020   Procedure: VIDEO BRONCHOSCOPY WITH ENDOBRONCHIAL NAVIGATION;  Surgeon: Garner Nash, DO;  Location: [facility];  Service: Pulmonary;  Laterality: Right;   VIDEO BRONCHOSCOPY WITH ENDOBRONCHIAL ULTRASOUND N/A 12/24/2020   Procedure: VIDEO BRONCHOSCOPY WITH ENDOBRONCHIAL ULTRASOUND;  Surgeon: Garner Nash, DO;  Location: [facility];  Service: Pulmonary;  Laterality: N/A;   VIDEO BRONCHOSCOPY WITH INSERTION OF INTERBRONCHIAL VALVE (IBV) N/A 12/31/2020   Procedure: VIDEO BRONCHOSCOPY WITH INSERTION OF INTERBRONCHIAL VALVE (IBV).VALVE IN CARTRIDGE 34mm,9mm. CHEST TUBE PLACEMENT.;  Surgeon: [name], MD;  [facility];  Service: Thoracic;  Laterality: N/A;       Family History  Problem Relation Age of Onset   Lung cancer Mother    Heart Problems Father    Heart attack Father 11   Stroke Father    Heart failure Father    Heart attack Maternal Grandmother    Stroke Maternal Grandmother    Heart attack Paternal  Uncle    Hypertension Brother    Autoimmune disease Neg Hx     Social History   Tobacco Use   Smoking status: Former    Packs/day: 0.75    Years: 44.00    Pack years: 33.00    Types: Cigarettes    Quit date: 11/28/2015    Years since quitting: 5.4   Smokeless tobacco: Never  Vaping Use   Vaping Use: Never used  Substance Use Topics   Alcohol use: Yes    Comment: occ   Drug use: No    Comment: 06/21/2017 "nothing since the 1980s"    Home Medications Prior to Admission medications   Medication Sig Start Date End Date Taking? Authorizing Provider  pantoprazole (PROTONIX) 40 MG tablet Take 1 tablet (40 mg total) by mouth daily. 05/05/21  Yes Quintella Reichert, MD  acetaminophen (TYLENOL) 500 MG tablet Take 1,000 mg by mouth every 6 (six) hours as needed for moderate pain.    [provider]  albuterol (VENTOLIN HFA) 108 (90 Base) MCG/ACT inhaler Inhale 2 puffs into the lungs every 6 (six) hours as needed for wheezing or shortness of breath. 01/12/21   Garner Nash, DO  amiodarone (PACERONE) 200 MG tablet Take 1 tablet (200 mg total) by mouth daily. Patient not taking: Reported on 04/28/2021 01/07/21   Elgie Collard, PA-C  amLODipine (NORVASC) 10 MG tablet TAKE 1 TABLET(10 MG) BY MOUTH DAILY Patient not taking: Reported on 04/28/2021 12/02/20   [name], MD  atorvastatin (LIPITOR) 80 MG tablet TAKE 1 TABLET(80 MG) BY MOUTH DAILY 04/01/21   [name], MD  cloNIDine (CATAPRES) 0.1 MG tablet Take 0.1 mg by mouth daily as needed (sleep). 03/12/21   [provider]  clopidogrel (PLAVIX) 75 MG tablet TAKE 1 TABLET BY MOUTH EVERY DAY Patient taking differently: Take 75 mg by mouth daily. 02/06/20   [name], MD  Diphenhyd-Hydrocort-Nystatin (FIRST-DUKES MOUTHWASH MT) Use as directed 5 mLs in the mouth or throat 4 (four) times daily -  before meals and at bedtime. Patient not taking: No sig reported 03/15/21   [provider]   lidocaine-prilocaine (EMLA) cream Apply 1 application topically once as needed (port access). 02/27/21   Aline August, MD  magnesium oxide (MAG-OX) 400 (240 Mg) MG  tablet Take 1 tablet (400 mg total) by mouth daily. 04/07/21   Heilingoetter, Cassandra L, PA-C  metoprolol succinate (TOPROL-XL) 25 MG 24 hr tablet Take 1 tablet (25 mg total) by mouth daily. 08/27/20   [name], MD  nitroGLYCERIN (NITROSTAT) 0.4 MG SL tablet PLACE 1 TABLET UNDER THE TONGUE EVERY 5 MINUTES AS NEEDED FOR CHEST PAIN. 3 DOSES MAX 04/16/20   [name], MD  ondansetron (ZOFRAN) 8 MG tablet Take 1 tablet (8 mg total) by mouth every 8 (eight) hours as needed for nausea or vomiting. Starting 3 days after chemotherapy 04/01/21   Heilingoetter, Cassandra L, PA-C  oxyCODONE (OXY IR/ROXICODONE) 5 MG immediate release tablet Take 1 tablet (5 mg total) by mouth every 4 (four) hours as needed for moderate pain. 04/27/21   Molpus, John, MD  prochlorperazine (COMPAZINE) 10 MG tablet Take 1 tablet (10 mg total) by mouth every 6 (six) hours as needed. 01/14/21   Heilingoetter, Cassandra L, PA-C  ranolazine (RANEXA) 1000 MG SR tablet Take 1 tablet (1,000 mg total) by mouth 2 (two) times daily. Patient not taking: Reported on 04/28/2021 11/08/19   [name], NP  RIVAROXABAN Alveda Reasons) VTE STARTER PACK (15 & 20 MG) Take one 15 mg tablet by mouth 2 (two) times daily. On day 22, switch to one 20 mg tablet once a day. Take with Food. 04/27/21     Tiotropium Bromide-Olodaterol (STIOLTO RESPIMAT) 2.5-2.5 MCG/ACT AERS Inhale 2 puffs into the lungs daily. 01/12/21   Garner Nash, DO    Allergies    Prednisone, Tetanus toxoids, Wellbutrin [bupropion], Indomethacin, Other, and Varenicline  Review of Systems   Review of Systems  Gastrointestinal:  Positive for abdominal pain.  All other systems reviewed and are negative.  Physical Exam Updated Vital Signs BP 137/87   Pulse 87   Temp 98.5 F (36.9 C) (Oral)   Resp (!)  23   Ht 5\' 9"  (1.753 m)   Wt 79.4 kg   SpO2 95%   BMI 25.84 kg/m   Physical Exam Vitals and nursing note reviewed.  Constitutional:      Appearance: He is well-developed.  HENT:     Head: Normocephalic and atraumatic.     Comments: No blood in the posterior oropharynx. There is an area of mucosal friability in the right anterior nare at the site of kesselbachs plexus. There is no overt bleeding in bilateral nares. Cardiovascular:     Rate and Rhythm: Normal rate and regular rhythm.     Heart sounds: No murmur heard. Pulmonary:     Effort: Pulmonary effort is normal. No respiratory distress.     Breath sounds: Normal breath sounds.  Abdominal:     Palpations: Abdomen is soft.     Tenderness: There is no guarding or rebound.     Comments: Mild generalized abdominal tenderness  Musculoskeletal:        General: No swelling or tenderness.  Skin:    General: Skin is warm and dry.  Neurological:     Mental Status: He is alert and oriented to person, place, and time.  Psychiatric:        Behavior: Behavior normal.    ED Results / Procedures / Treatments   Labs (all labs ordered are listed, but only abnormal results are displayed) Labs Reviewed  COMPREHENSIVE METABOLIC PANEL - Abnormal; Notable for the following components:      Result Value   Glucose, Bld 103 (*)    AST 12 (*)  All other components within normal limits  CBC WITH DIFFERENTIAL/PLATELET - Abnormal; Notable for the following components:   WBC 18.3 (*)    RBC 3.61 (*)    Hemoglobin 10.7 (*)    HCT 33.2 (*)    RDW 16.2 (*)    Neutro Abs 13.2 (*)    Monocytes Absolute 1.2 (*)    Abs Immature Granulocytes 1.23 (*)    All other components within normal limits  PROTIME-INR - Abnormal; Notable for the following components:   Prothrombin Time 28.4 (*)    INR 2.7 (*)    All other components within normal limits  MAGNESIUM - Abnormal; Notable for the following components:   Magnesium 1.5 (*)    All other  components within normal limits  URINALYSIS, ROUTINE W REFLEX MICROSCOPIC    EKG None  Radiology DG Chest 2 View  Result Date: 05/05/2021 CLINICAL DATA:  Dyspnea, nausea, vomiting EXAM: CHEST - 2 VIEW COMPARISON:  04/28/2021 FINDINGS: Small right pleural effusion is stable. Right-sided volume loss is unchanged left lung is clear. No pneumothorax. No pleural effusion on the left. Filter like foreign bodies are seen within the right hilum, unchanged from multiple prior examinations, possibly representing bronchial plugs. Coronary artery bypass grafting has been performed. Cardiac size within normal limits. Right internal jugular chest port is seen with its tip within the superior right atrium. No acute bone abnormality. IMPRESSION: Stable small right pleural effusion. Unchanged right-sided volume loss secondary to right upper lobectomy. Electronically Signed   By: Fidela Salisbury M.D.   On: 05/05/2021 00:23   CT Abdomen Pelvis W Contrast  Result Date: 05/05/2021 CLINICAL DATA:  65 year old male with abdominal pain. Nausea. Epistaxis. History of lung cancer. EXAM: CT ABDOMEN AND PELVIS WITH CONTRAST TECHNIQUE: Multidetector CT imaging of the abdomen and pelvis was performed using the standard protocol following bolus administration of intravenous contrast. CONTRAST:  57mL OMNIPAQUE IOHEXOL 350 MG/ML SOLN COMPARISON:  CT Chest, Abdomen, and Pelvis 02/14/2021, and earlier FINDINGS: Lower chest: Small right lung base pleural effusion with thickened and enhancing pleura is mildly larger since June. No associated adjacent right lung base airspace disease. Left lung base is stable and negative. No pericardial effusion. Hepatobiliary: A tiny low-density area at the liver dome on series 2, image 12 is stable since at least 2020 and appears benign. Liver enhancement elsewhere remains normal. Negative gallbladder. No bile duct enlargement. Pancreas: Negative. Spleen: Negative, small splenules (normal variant).  Adrenals/Urinary Tract: Normal adrenal glands. Nonobstructed kidneys enhance symmetrically. And there is symmetric renal contrast excretion on the delayed images. Chronic and benign left renal midpole cyst. Punctate right upper pole nephrolithiasis. Both ureters are decompressed and normal. Unremarkable bladder. Incidental pelvic phleboliths. Stomach/Bowel: Mild diverticulosis in the sigmoid colon. Otherwise negative large bowel. No active inflammation. Normal appendix on series 2, image 61. Oral contrast has not yet reached the terminal ileum. But there is no dilated small bowel. Opacified small bowel loops appear unremarkable. There is a small volume of residual oral contrast in the stomach and duodenum which appear normal. No free air or free fluid. Vascular/Lymphatic: Aortoiliac calcified atherosclerosis. Major arterial structures are patent. Portal venous system is patent. Small abdominal lymph nodes are stable. No lymphadenopathy. Reproductive: Negative. Other: No pelvic free fluid. Musculoskeletal: Prior sternotomy. Chronic right lateral 8th rib fracture is stable. Chronic left L5 pars fracture. No significant spondylolisthesis. No acute or suspicious osseous lesion identified. IMPRESSION: 1. No acute or inflammatory process identified in the abdomen or pelvis. Normal appendix. 2.  A small right lung base complex pleural effusion with thickened pleura is mildly larger since June. 3. Aortic Atherosclerosis (ICD10-I70.0). Electronically Signed   By: Genevie Ann M.D.   On: 05/05/2021 04:08    Procedures Procedures   Medications Ordered in ED Medications  fentaNYL (SUBLIMAZE) injection 50 mcg (50 mcg Intravenous Given 05/05/21 0023)  sodium chloride 0.9 % bolus 500 mL (0 mLs Intravenous Stopped 05/05/21 0126)  ondansetron (ZOFRAN) injection 4 mg (4 mg Intravenous Given 05/05/21 0022)  oxymetazoline (AFRIN) 0.05 % nasal spray 1 spray (1 spray Each Nare Given 05/05/21 0021)  morphine 4 MG/ML injection 4 mg (4 mg  Intravenous Given 05/05/21 0137)  iohexol (OMNIPAQUE) 350 MG/ML injection 100 mL (85 mLs Intravenous Contrast Given 05/05/21 0344)  morphine 4 MG/ML injection 4 mg (4 mg Intravenous Given 05/05/21 0410)  magnesium sulfate IVPB 2 g 50 mL (2 g Intravenous New Bag/Given 05/05/21 0416)    ED Course  I have reviewed the triage vital signs and the nursing notes.  Pertinent labs & imaging results that were available during my care of the patient were reviewed by me and considered in my medical decision making (see chart for details).    MDM Rules/Calculators/A&P                          patient with history of lung cancer, recent PE on Xarelto here for evaluation of epistaxis, abdominal discomfort and two episodes of hematemesis. On evaluation he is non-toxic appearing. He has normal blood pressures in the emergency department. On examination he does have stigmata of recent epistaxis but no active bleeding at this time. Hemoglobin is improved compared to recent hospital discharge. He does have leukocytosis. Abdominal examination is benign. CT abdomen pelvis was obtained, which is negative for acute process. Patient does report hematemesis, in setting of his recent epistaxis feel that this is likely related to ingested blood. He did have endoscopy a few months ago with no active ulcers. Will start on Protonix empirically given his anticoagulation and recent esophagitis. Discussed home care for epistaxis. Plan to have him follow-up closely with PCP as well as oncology. Return precautions discussed.  Final Clinical Impression(s) / ED Diagnoses Final diagnoses:  Epistaxis  Periumbilical abdominal pain    Rx / DC Orders ED Discharge Orders          Ordered    pantoprazole (PROTONIX) 40 MG tablet  Daily        05/05/21 0452             Quintella Reichert, MD 05/05/21 0600

## 2021-05-05 ENCOUNTER — Telehealth: Payer: Self-pay

## 2021-05-05 ENCOUNTER — Other Ambulatory Visit: Payer: Medicare Other

## 2021-05-05 ENCOUNTER — Other Ambulatory Visit: Payer: Self-pay | Admitting: Physician Assistant

## 2021-05-05 ENCOUNTER — Emergency Department (HOSPITAL_BASED_OUTPATIENT_CLINIC_OR_DEPARTMENT_OTHER): Payer: Medicare Other

## 2021-05-05 DIAGNOSIS — R040 Epistaxis: Secondary | ICD-10-CM | POA: Diagnosis not present

## 2021-05-05 LAB — COMPREHENSIVE METABOLIC PANEL
ALT: 11 U/L (ref 0–44)
AST: 12 U/L — ABNORMAL LOW (ref 15–41)
Albumin: 3.6 g/dL (ref 3.5–5.0)
Alkaline Phosphatase: 114 U/L (ref 38–126)
Anion gap: 8 (ref 5–15)
BUN: 17 mg/dL (ref 8–23)
CO2: 26 mmol/L (ref 22–32)
Calcium: 8.9 mg/dL (ref 8.9–10.3)
Chloride: 104 mmol/L (ref 98–111)
Creatinine, Ser: 1.07 mg/dL (ref 0.61–1.24)
GFR, Estimated: >60 mL/min (ref >60)
Glucose, Bld: 103 mg/dL — ABNORMAL HIGH (ref 70–99)
Potassium: 3.5 mmol/L (ref 3.5–5.1)
Sodium: 138 mmol/L (ref 135–145)
Total Bilirubin: 0.4 mg/dL (ref 0.3–1.2)
Total Protein: 6.7 g/dL (ref 6.5–8.1)

## 2021-05-05 LAB — CBC WITH DIFFERENTIAL/PLATELET
Abs Immature Granulocytes: 1.23 10*3/uL — ABNORMAL HIGH (ref 0.00–0.07)
Basophils Absolute: 0.1 10*3/uL (ref 0.0–0.1)
Basophils Relative: 1 %
Eosinophils Absolute: 0.1 10*3/uL (ref 0.0–0.5)
Eosinophils Relative: 1 %
HCT: 33.2 % — ABNORMAL LOW (ref 39.0–52.0)
Hemoglobin: 10.7 g/dL — ABNORMAL LOW (ref 13.0–17.0)
Immature Granulocytes: 7 %
Lymphocytes Relative: 13 %
Lymphs Abs: 2.4 10*3/uL (ref 0.7–4.0)
MCH: 29.6 pg (ref 26.0–34.0)
MCHC: 32.2 g/dL (ref 30.0–36.0)
MCV: 92 fL (ref 80.0–100.0)
Monocytes Absolute: 1.2 10*3/uL — ABNORMAL HIGH (ref 0.1–1.0)
Monocytes Relative: 7 %
Neutro Abs: 13.2 10*3/uL — ABNORMAL HIGH (ref 1.7–7.7)
Neutrophils Relative %: 71 %
Platelets: 176 10*3/uL (ref 150–400)
RBC: 3.61 MIL/uL — ABNORMAL LOW (ref 4.22–5.81)
RDW: 16.2 % — ABNORMAL HIGH (ref 11.5–15.5)
Smear Review: NORMAL
WBC: 18.3 10*3/uL — ABNORMAL HIGH (ref 4.0–10.5)
nRBC: 0.1 % (ref 0.0–0.2)

## 2021-05-05 LAB — URINALYSIS, ROUTINE W REFLEX MICROSCOPIC
Bilirubin Urine: NEGATIVE
Glucose, UA: NEGATIVE mg/dL
Hgb urine dipstick: NEGATIVE
Ketones, ur: NEGATIVE mg/dL
Leukocytes,Ua: NEGATIVE
Nitrite: NEGATIVE
Protein, ur: NEGATIVE mg/dL
Specific Gravity, Urine: 1.01 (ref 1.005–1.030)
pH: 6 (ref 5.0–8.0)

## 2021-05-05 LAB — PROTIME-INR
INR: 2.7 — ABNORMAL HIGH (ref 0.8–1.2)
Prothrombin Time: 28.4 seconds — ABNORMAL HIGH (ref 11.4–15.2)

## 2021-05-05 LAB — MAGNESIUM: Magnesium: 1.5 mg/dL — ABNORMAL LOW (ref 1.7–2.4)

## 2021-05-05 MED ORDER — MAGNESIUM SULFATE 2 GM/50ML IV SOLN
2.0000 g | Freq: Once | INTRAVENOUS | Status: AC
  Administered 2021-05-05: 2 g via INTRAVENOUS
  Filled 2021-05-05: qty 50

## 2021-05-05 MED ORDER — MAGNESIUM SULFATE 50 % IJ SOLN
2.0000 g | Freq: Once | INTRAMUSCULAR | Status: DC
  Filled 2021-05-05: qty 4

## 2021-05-05 MED ORDER — MORPHINE SULFATE (PF) 4 MG/ML IV SOLN
4.0000 mg | Freq: Once | INTRAVENOUS | Status: AC
  Administered 2021-05-05: 4 mg via INTRAVENOUS
  Filled 2021-05-05: qty 1

## 2021-05-05 MED ORDER — PANTOPRAZOLE SODIUM 40 MG PO TBEC: 40.0000 mg | DELAYED_RELEASE_TABLET | Freq: Every day | ORAL | 0 refills | Status: DC

## 2021-05-05 MED ORDER — IOHEXOL 350 MG/ML SOLN
100.0000 mL | Freq: Once | INTRAVENOUS | Status: AC | PRN
  Administered 2021-05-05: 85 mL via INTRAVENOUS

## 2021-05-05 NOTE — ED Notes (Signed)
Pt continues to drink his oral contrast

## 2021-05-05 NOTE — ED Notes (Signed)
Patient transported to X-ray 

## 2021-05-05 NOTE — ED Notes (Signed)
Pt A&OX4 ambulatory at d/c with independent steady gait. Pt reports his wife will be driving him home.

## 2021-05-05 NOTE — Telephone Encounter (Signed)
Called patient back. Informed him of [name], advisement. Made him an appointment with [name] next week to follow up with hospital visit and all his questions.

## 2021-05-05 NOTE — Telephone Encounter (Signed)
   Pt is calling back to f/u, pt also said that he was in the hospital this weekend and they found blood clots, he wants to see Dr. Irish Lack, he said to call him back tomorrow, he is a little busy today. He also requested if a different nurse calls him

## 2021-05-05 NOTE — Telephone Encounter (Signed)
Pt LM wanting to know who he should follow-up with regarding his rx of Xarelto since being seen in the ED again.  Discussed with Dr. [name] who advised pt is taking Plavix as well and should follow-up up with Cardio to determine his next steps.  I have called the pt back and advised as indicated. He shared that he has a call into them pending as well.

## 2021-05-05 NOTE — ED Notes (Signed)
Patient transported to CT 

## 2021-05-07 NOTE — Progress Notes (Signed)
[facility] OFFICE PROGRESS NOTE  [name], MD [place] [address]  DIAGNOSIS: Stage IV (pT1c, N1, M1b) large cell neuroendocrine carcinoma.  He presented with a right upper lobe lung nodule and hilar lymphadenopathy.  He was diagnosed in April 2022. He also had a small metastatic brain lesion.    PDL1: 0%  PRIOR THERAPY:  1) Robotic assisted thoracic surgery with a right upper lobectomy which was performed on 12/24/2020.   2) SRS to the small metastatic brain lesion completed on 02/13/2021 under the care of Dr. Lisbeth Renshaw. 3) Adjuvant chemotherapy with cisplatin 80 mg/m2 on day 1, etoposide 100 mg per metered squared on days 1, 2, and 3, IV every 3 weeks.  Status post 4 cycles. Last dose on 04/20/21.    CURRENT THERAPY: Observation  INTERVAL HISTORY: [name] 65 y.o. male returns to the clinic today for a follow-up visit.  The patient was diagnosed with large cell neuroendocrine carcinoma in April 2022.  He is status post a robotic assisted thoracic surgery with a right upper lobectomy.  This was then followed by 4 cycles of adjuvant chemotherapy.  The patient had a poor tolerance to chemotherapy with numerous hospitalizations.  His last treatment was on 04/20/2021.  In the interval since his last appointment, the patient was seen in the emergency room 3 times.  The first encounter was on 04/26/2021 in which the patient presented with a chief complaint of shortness of breath.  He was found to have a small nonocclusive coronary embolism.  He was started on Xarelto.  He also was on aspirin and Plavix.  Per cardiology, they recommended that he hold the aspirin and continue with Xarelto and Plavix. He is seeing cardiology later this week.   He was then seen on 04/28/2021 in the emergency room for a chief complaint of hypotension, tachycardia, and weakness.  He got blood cultures performed which were negative.  He also got 1 unit of blood and IV  fluids.  He then presented to the emergency room again on 05/04/2021 for the chief complaint of malaise, fatigue, and nausea/vomiting.  He also was reporting centralized abdominal pain and he also had had 2 episodes of epistaxis.  Today, the patient is feeling fine today without any concerning complaints except he is anxious about then next steps in his care.  He denies any recent fever, chills, or night sweats.  His weight is stable.  He has some intermittent chest discomfort since his prior lung surgery that moves across the lower part of his chest..  He states his breathing is pretty good and has some mild dyspnea on exertion.  He denies any cough or hemoptysis. He is scheduled for a brain MRI on 05/15/21 for a follow up for his history of metastatic disease to the brain. He notes some trouble reading first thin in the morning and it takes some time to adjust his eyes. He also feels like he is having a few more headaches in the temporal region bilaterally. He will tachy 1-2 tylenol and his symptoms completely resolve. He denies any rashes or skin changes.  He denies recent nausea/vomiting. He denies any diarrhea or constipation.  He is here today for evaluation and to review his recent scan results and for a more detailed discussion about his current condition and recommended treatment options.    MEDICAL HISTORY: Past Medical History:  Diagnosis Date   Anxiety    Arthritis    Basal  cell carcinoma (BCC) of forehead    CAD (coronary artery disease)    a. 10/2015 ant STEMI >> LHC with 3 v CAD; oLAD tx with POBA >> emergent CABG. b. Multiple evals since that time, early graft failure of SVG-RCA by cath 03/2016. c. 2/19 PCI/DES x1 to pRCA, normal EF.   Carotid artery disease (DISH)    a. 40-59% BICA 02/2018.   Depression    Dyspnea    Ectopic atrial tachycardia (HCC)    Esophageal reflux    eosinophil esophagitis   Family history of adverse reaction to anesthesia    "sister has PONV" (06/21/2017)    Former tobacco use    Gout    Hepatitis C    "treated and cured" (06/21/2017)   High cholesterol    History of kidney stones    Hypertension    Ischemic cardiomyopathy    a. EF 25-30% at intraop TEE 4/17  //  b. Limited Echo 5/17 - EF 45-50%, mild ant HK. c. EF 55-65% by cath 09/2017.   Migraine    "3-4/yr" (06/21/2017)   Myocardial infarction ([place]) 10/2015   Palpitations    Sinus bradycardia    a. HR dropping into 40s in 02/2016 -> BB reduced.   Stroke [facility]) 10/2016   "small one; sometimes my memory/cognitive issues" (06/21/2017)   Symptomatic hypotension    a. 02/2016 ER visit -> meds reduced.   Syncope    Wears dentures    Wears glasses     ALLERGIES:  is allergic to prednisone, tetanus toxoids, wellbutrin [bupropion], indomethacin, other, and varenicline.  MEDICATIONS:  Current Outpatient Medications  Medication Sig Dispense Refill   acetaminophen (TYLENOL) 500 MG tablet Take 1,000 mg by mouth every 6 (six) hours as needed for moderate pain.     albuterol (VENTOLIN HFA) 108 (90 Base) MCG/ACT inhaler Inhale 2 puffs into the lungs every 6 (six) hours as needed for wheezing or shortness of breath. 8 g 2   amiodarone (PACERONE) 200 MG tablet Take 1 tablet (200 mg total) by mouth daily. (Patient not taking: Reported on 04/28/2021) 30 tablet 1   amLODipine (NORVASC) 10 MG tablet TAKE 1 TABLET(10 MG) BY MOUTH DAILY (Patient not taking: Reported on 04/28/2021) 90 tablet 1   atorvastatin (LIPITOR) 80 MG tablet TAKE 1 TABLET(80 MG) BY MOUTH DAILY 90 tablet 2   cloNIDine (CATAPRES) 0.1 MG tablet Take 0.1 mg by mouth daily as needed (sleep).     clopidogrel (PLAVIX) 75 MG tablet TAKE 1 TABLET BY MOUTH EVERY DAY (Patient taking differently: Take 75 mg by mouth daily.) 90 tablet 1   Diphenhyd-Hydrocort-Nystatin (FIRST-DUKES MOUTHWASH MT) Use as directed 5 mLs in the mouth or throat 4 (four) times daily -  before meals and at bedtime. (Patient not taking: No sig reported)      lidocaine-prilocaine (EMLA) cream Apply 1 application topically once as needed (port access).     magnesium oxide (MAG-OX) 400 (240 Mg) MG tablet Take 1 tablet (400 mg total) by mouth daily. 30 tablet 1   metoprolol succinate (TOPROL-XL) 25 MG 24 hr tablet Take 1 tablet (25 mg total) by mouth daily. 90 tablet 3   nitroGLYCERIN (NITROSTAT) 0.4 MG SL tablet PLACE 1 TABLET UNDER THE TONGUE EVERY 5 MINUTES AS NEEDED FOR CHEST PAIN. 3 DOSES MAX 25 tablet 4   ondansetron (ZOFRAN) 8 MG tablet Take 1 tablet (8 mg total) by mouth every 8 (eight) hours as needed for nausea or vomiting. Starting 3 days after chemotherapy  30 tablet 2   oxyCODONE (OXY IR/ROXICODONE) 5 MG immediate release tablet Take 1 tablet (5 mg total) by mouth every 4 (four) hours as needed for moderate pain. 30 tablet 0   pantoprazole (PROTONIX) 40 MG tablet Take 1 tablet (40 mg total) by mouth daily. 14 tablet 0   prochlorperazine (COMPAZINE) 10 MG tablet Take 1 tablet (10 mg total) by mouth every 6 (six) hours as needed. 30 tablet 2   ranolazine (RANEXA) 1000 MG SR tablet Take 1 tablet (1,000 mg total) by mouth 2 (two) times daily. (Patient not taking: Reported on 04/28/2021) 180 tablet 0   RIVAROXABAN (XARELTO) VTE STARTER PACK (15 & 20 MG) Take one 15 mg tablet by mouth 2 (two) times daily. On day 22, switch to one 20 mg tablet once a day. Take with Food. 51 each 0   Tiotropium Bromide-Olodaterol (STIOLTO RESPIMAT) 2.5-2.5 MCG/ACT AERS Inhale 2 puffs into the lungs daily. 4 g 3   No current facility-administered medications for this visit.    SURGICAL HISTORY:  Past Surgical History:  Procedure Laterality Date   25 GAUGE PARS PLANA VITRECTOMY WITH 20 GAUGE MVR PORT  12/31/2020   Procedure: 25 GAUGE PARS PLANA VITRECTOMY WITH 20 GAUGE MVR PORT;  Surgeon: [name], MD;  Location: [facility];  Service: Thoracic;;   ANTERIOR CERVICAL DECOMP/DISCECTOMY FUSION N/A 10/17/2018   Procedure: Anterior Cervical Decompression Fusion -  Cervical seven -Thoracic one;  Surgeon: Consuella Lose, MD;  Location: [place];  Service: Neurosurgery;  Laterality: N/A;   BASAL CELL CARCINOMA EXCISION     "forehead   BIOPSY  07/20/2019   Procedure: BIOPSY;  Surgeon: Carol Ada, MD;  Location: [facility];  Service: Endoscopy;;   BRONCHIAL BIOPSY  12/24/2020   Procedure: BRONCHIAL BIOPSIES;  Surgeon: Garner Nash, DO;  Location: [facility];  Service: Pulmonary;;   BRONCHIAL BRUSHINGS  12/24/2020   Procedure: BRONCHIAL BRUSHINGS;  Surgeon: Garner Nash, DO;  Location: [place];  Service: Pulmonary;;   BRONCHIAL NEEDLE ASPIRATION BIOPSY  12/24/2020   Procedure: BRONCHIAL NEEDLE ASPIRATION BIOPSIES;  Surgeon: Garner Nash, DO;  Location: [place];  Service: Pulmonary;;   CARDIAC CATHETERIZATION N/A 11/28/2015   Procedure: Left Heart Cath and Coronary Angiography;  Surgeon: [name], MD;  Location: [facility];  Service: Cardiovascular;  Laterality: N/A;   CARDIAC CATHETERIZATION N/A 11/28/2015   Procedure: Coronary Balloon Angioplasty;  Surgeon: [name], MD;  Location: [facility];  Service: Cardiovascular;  Laterality: N/A;  ostial LAD   CARDIAC CATHETERIZATION N/A 11/28/2015   Procedure: Coronary/Graft Angiography;  Surgeon: [name], MD;  Location: [facility];  Service: Cardiovascular;  Laterality: N/A;  coronaries only    CARDIAC CATHETERIZATION N/A 04/21/2016   Procedure: Left Heart Cath and Coronary Angiography;  Surgeon: Wellington Hampshire, MD;  Location: [place];  Service: Cardiovascular;  Laterality: N/A;   CARDIAC CATHETERIZATION N/A 06/14/2016   Procedure: Left Heart Cath and Cors/Grafts Angiography;  Surgeon: Lorretta Harp, MD;  Location: [place] [facility];  Service: Cardiovascular;  Laterality: N/A;   CARDIAC CATHETERIZATION N/A 09/08/2016   Procedure: Left Heart Cath and Cors/Grafts Angiography;  Surgeon: Wellington Hampshire, MD;  Location: [place] [facility];  Service: Cardiovascular;  Laterality: N/A;   CARDIAC CATHETERIZATION     CORONARY ARTERY BYPASS GRAFT N/A 11/28/2015   Procedure: CORONARY ARTERY BYPASS GRAFTING (CABG) TIMES FIVE USING LEFT INTERNAL MAMMARY ARTERY AND RIGHT  GREATER SAPHENOUS,VIEN HARVEATED BY ENDOVIEN, INTRAOPPRATIVE TEE;  Surgeon: Gaye Pollack, MD;  Location: [facility];  Service: Open Heart Surgery;  Laterality: N/A;   CORONARY STENT INTERVENTION N/A 10/05/2017   Procedure: CORONARY STENT INTERVENTION;  Surgeon: [name], MD;  Location: [place] [facility];  Service: Cardiovascular;  Laterality: N/A;   ESOPHAGOGASTRODUODENOSCOPY (EGD) WITH PROPOFOL N/A 07/20/2019   Procedure: ESOPHAGOGASTRODUODENOSCOPY (EGD) WITH PROPOFOL;  Surgeon: Carol Ada, MD;  [facility];  Service: Endoscopy;  Laterality: N/A;   ESOPHAGOGASTRODUODENOSCOPY (EGD) WITH PROPOFOL N/A 01/30/2021   Procedure: ESOPHAGOGASTRODUODENOSCOPY (EGD) WITH PROPOFOL;  Surgeon: Carol Ada, MD;  [facility];  Service: Endoscopy;  Laterality: N/A;   FIDUCIAL MARKER PLACEMENT  12/24/2020   Procedure: FIDUCIAL DYE MARKER PLACEMENT;  Surgeon: Garner Nash, DO;  Location: [facility];  Service: Pulmonary;;   HUMERUS SURGERY Right 1969   "tumor inside bone; filled it w/bone chips"   INTERCOSTAL NERVE BLOCK Right 12/24/2020   Procedure: INTERCOSTAL NERVE BLOCK;  Surgeon: [name], MD;  Location: [facility];  Service: Thoracic;  Laterality: Right;   IR IMAGING GUIDED PORT INSERTION  02/06/2021   LEFT HEART CATH AND CORS/GRAFTS ANGIOGRAPHY N/A 03/11/2017   Procedure: Left Heart Cath and Cors/Grafts Angiography;  Surgeon: Leonie Man, MD;  Location: [place] [facility];  Service: Cardiovascular;  Laterality: N/A;   LEFT HEART CATH AND CORS/GRAFTS ANGIOGRAPHY N/A 10/05/2017   Procedure: LEFT HEART CATH AND CORS/GRAFTS ANGIOGRAPHY;  Surgeon: [name], MD;  Location: [place] [facility];  Service: Cardiovascular;   Laterality: N/A;   LEFT HEART CATH AND CORS/GRAFTS ANGIOGRAPHY N/A 04/11/2019   Procedure: LEFT HEART CATH AND CORS/GRAFTS ANGIOGRAPHY;  Surgeon: [name], MD;  Location: [place] [facility];  Service: Cardiovascular;  Laterality: N/A;   NODE DISSECTION Right 12/24/2020   Procedure: NODE DISSECTION;  Surgeon: [name], MD;  Location: [place];  Service: Thoracic;  Laterality: Right;   PERIPHERAL VASCULAR CATHETERIZATION N/A 06/14/2016   Procedure: Lower Extremity Angiography;  Surgeon: Lorretta Harp, MD;  Location: [place] [facility];  Service: Cardiovascular;  Laterality: N/A;   VIDEO BRONCHOSCOPY WITH ENDOBRONCHIAL NAVIGATION Right 12/24/2020   Procedure: VIDEO BRONCHOSCOPY WITH ENDOBRONCHIAL NAVIGATION;  Surgeon: Garner Nash, DO;  Location: [place];  Service: Pulmonary;  Laterality: Right;   VIDEO BRONCHOSCOPY WITH ENDOBRONCHIAL ULTRASOUND N/A 12/24/2020   Procedure: VIDEO BRONCHOSCOPY WITH ENDOBRONCHIAL ULTRASOUND;  Surgeon: Garner Nash, DO;  Location: [place];  Service: Pulmonary;  Laterality: N/A;   VIDEO BRONCHOSCOPY WITH INSERTION OF INTERBRONCHIAL VALVE (IBV) N/A 12/31/2020   Procedure: VIDEO BRONCHOSCOPY WITH INSERTION OF INTERBRONCHIAL VALVE (IBV).VALVE IN CARTRIDGE 83m,9mm. CHEST TUBE PLACEMENT.;  Surgeon: LLajuana Matte MD;  [facility];  Service: Thoracic;  Laterality: N/A;    REVIEW OF SYSTEMS:   Review of Systems  Constitutional: Positive for fatigue and decreased appetite. Negative for  chills, fever and unexpected weight change.  HENT: Negative for mouth sores, nosebleeds, sore throat and trouble swallowing.   Eyes: Negative for eye problems and icterus.  Respiratory: Positive for mild dyspnea on exertion. Negative for cough, hemoptysis, and wheezing.   Cardiovascular: Positive for intermittent chest discomfort. Negative for leg swelling.  Gastrointestinal: Negative for abdominal pain, constipation, diarrhea, nausea and vomiting.   Genitourinary: Negative for bladder incontinence, difficulty urinating, dysuria, frequency and hematuria.   Musculoskeletal: Negative for back pain, gait problem, neck pain and neck stiffness.  Skin: Negative for itching and rash.  Neurological: Negative  for dizziness, extremity weakness, gait problem, headaches, light-headedness and seizures.  Hematological: Negative for adenopathy. Does not bruise/bleed easily.  Psychiatric/Behavioral: Positive for being anxious. Negative for confusion, depression and sleep disturbance.    PHYSICAL EXAMINATION:  Blood pressure 122/73, pulse (!) 115, temperature 98.9 F (37.2 C), temperature source Tympanic, resp. rate 19, height $RemoveBe'5\' 9"'rDgZfaELQ$  (1.753 m), weight 173 lb (78.5 kg), SpO2 100 %.  ECOG PERFORMANCE STATUS: 1  Physical Exam  Constitutional: Oriented to person, place, and time and well-developed, well-nourished, and in no distress. HENT:  Head: Normocephalic and atraumatic.  Mouth/Throat: Oropharynx is clear and moist. No oropharyngeal exudate.  Eyes: Conjunctivae are normal. Right eye exhibits no discharge. Left eye exhibits no discharge. No scleral icterus.  Neck: Normal range of motion. Neck supple.  Cardiovascular: Positive for tachycardia, regular rhythm, normal heart sounds and intact distal pulses.   Pulmonary/Chest: Effort normal and breath sounds normal. No respiratory distress. No wheezes. No rales.  Abdominal: Soft. Bowel sounds are normal. Exhibits no distension and no mass. There is no tenderness.  Musculoskeletal: Normal range of motion. Exhibits no edema.  Lymphadenopathy:    No cervical adenopathy.  Neurological: Alert and oriented to person, place, and time. Exhibits normal muscle tone. Gait normal. Coordination normal.  Skin: Skin is warm and dry. No rash noted. Not diaphoretic. No erythema. No pallor.  Psychiatric: Mood, memory and judgment normal.  Vitals reviewed.  LABORATORY DATA: Lab Results  Component Value Date   WBC  17.8 (H) 05/11/2021   HGB 10.2 (L) 05/11/2021   HCT 31.4 (L) 05/11/2021   MCV 92.1 05/11/2021   PLT 365 05/11/2021      Chemistry      Component Value Date/Time   NA 138 05/11/2021 0750   K 4.3 05/11/2021 0750   CL 102 05/11/2021 0750   CO2 25 05/11/2021 0750   BUN 14 05/11/2021 0750   CREATININE 1.19 05/11/2021 0750   CREATININE 1.00 02/12/2016 1038      Component Value Date/Time   CALCIUM 9.7 05/11/2021 0750   ALKPHOS 123 05/11/2021 0750   AST 9 (L) 05/11/2021 0750   ALT 8 05/11/2021 0750   BILITOT 0.5 05/11/2021 0750       RADIOGRAPHIC STUDIES:  DG Chest 2 View  Result Date: 05/05/2021 CLINICAL DATA:  Dyspnea, nausea, vomiting EXAM: CHEST - 2 VIEW COMPARISON:  04/28/2021 FINDINGS: Small right pleural effusion is stable. Right-sided volume loss is unchanged left lung is clear. No pneumothorax. No pleural effusion on the left. Filter like foreign bodies are seen within the right hilum, unchanged from multiple prior examinations, possibly representing bronchial plugs. Coronary artery bypass grafting has been performed. Cardiac size within normal limits. Right internal jugular chest port is seen with its tip within the superior right atrium. No acute bone abnormality. IMPRESSION: Stable small right pleural effusion. Unchanged right-sided volume loss secondary to right upper lobectomy. Electronically Signed   By: Fidela Salisbury M.D.   On: 05/05/2021 00:23   CT Angio Chest PE W/Cm &/Or Wo Cm  Result Date: 04/27/2021 CLINICAL DATA:  Concern for pulmonary embolism. EXAM: CT ANGIOGRAPHY CHEST WITH CONTRAST TECHNIQUE: Multidetector CT imaging of the chest was performed using the standard protocol during bolus administration of intravenous contrast. Multiplanar CT image reconstructions and MIPs were obtained to evaluate the vascular anatomy. CONTRAST:  113mL OMNIPAQUE IOHEXOL 350 MG/ML SOLN COMPARISON:  Chest CT dated 02/28/2021. FINDINGS: Cardiovascular: There is no cardiomegaly or  pericardial effusion. 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 There  is mild atherosclerotic calcification of the thoracic aorta. No aneurysmal dilatation or dissection. The origins of the great vessels of the aortic arch appear patent as visualized. Evaluation of the pulmonary arteries is limited due to respiratory motion artifact. There is linear nonocclusive thrombus within the lobar branches of the left lower lobe and lingula extending into the segmental pulmonary artery branches, age indeterminate. Correlation with history of recent PE recommended. Nonocclusive thrombus is also noted in the segmental branch of the right lower lobe (121/7). These findings however are new since the prior CT of 02/28/2021. Mediastinum/Nodes: No hilar or mediastinal adenopathy. The esophagus is grossly unremarkable. No mediastinal fluid collection. Lungs/Pleura: Similar appearance of small right pleural effusion with partial compressive atelectasis of the right lower lobe. Pneumonia is not excluded clinical correlation is recommended. Mild thickening of the bronchial wall of the right lower lobe. No focal consolidation or pneumothorax. The central airways remain patent. Postsurgical changes of the right lung as seen previously. Upper Abdomen: No acute abnormality. Musculoskeletal: Multilevel degenerative changes with anterior osteophyte/spurring. C7-T1 ACDF. Median sternotomy wires. No acute osseous pathology. Old healed right lateral rib fracture. Review of the MIP images confirms the above findings. IMPRESSION: 1. Nonocclusive pulmonary emboli within the lobar branches of the left lower lobe and lingula extending into the segmental pulmonary artery branches, age indeterminate, but new since the prior CT. Nonocclusive thrombus is also noted in the segmental branch of the right lower lobe. No CT evidence of right heart straining. 2. Similar appearance of small right pleural effusion with partial compressive  atelectasis of the right lower lobe. Pneumonia is not excluded clinical correlation is recommended. 3. Aortic Atherosclerosis (ICD10-I70.0). These results were called by telephone at the time of interpretation on 04/27/2021 at 1:52 am to Dr. Florina Ou, who verbally acknowledged these results. Electronically Signed   By: Anner Crete M.D.   On: 04/27/2021 02:02   CT Abdomen Pelvis W Contrast  Result Date: 05/05/2021 CLINICAL DATA:  65 year old male with abdominal pain. Nausea. Epistaxis. History of lung cancer. EXAM: CT ABDOMEN AND PELVIS WITH CONTRAST TECHNIQUE: Multidetector CT imaging of the abdomen and pelvis was performed using the standard protocol following bolus administration of intravenous contrast. CONTRAST:  32m OMNIPAQUE IOHEXOL 350 MG/ML SOLN COMPARISON:  CT Chest, Abdomen, and Pelvis 02/14/2021, and earlier FINDINGS: Lower chest: Small right lung base pleural effusion with thickened and enhancing pleura is mildly larger since June. No associated adjacent right lung base airspace disease. Left lung base is stable and negative. No pericardial effusion. Hepatobiliary: A tiny low-density area at the liver dome on series 2, image 12 is stable since at least 2020 and appears benign. Liver enhancement elsewhere remains normal. Negative gallbladder. No bile duct enlargement. Pancreas: Negative. Spleen: Negative, small splenules (normal variant). Adrenals/Urinary Tract: Normal adrenal glands. Nonobstructed kidneys enhance symmetrically. And there is symmetric renal contrast excretion on the delayed images. Chronic and benign left renal midpole cyst. Punctate right upper pole nephrolithiasis. Both ureters are decompressed and normal. Unremarkable bladder. Incidental pelvic phleboliths. Stomach/Bowel: Mild diverticulosis in the sigmoid colon. Otherwise negative large bowel. No active inflammation. Normal appendix on series 2, image 61. Oral contrast has not yet reached the terminal ileum. But there is no  dilated small bowel. Opacified small bowel loops appear unremarkable. There is a small volume of residual oral contrast in the stomach and duodenum which appear normal. No free air or free fluid. Vascular/Lymphatic: Aortoiliac calcified atherosclerosis. Major arterial structures are patent. Portal venous system is patent. Small abdominal lymph  nodes are stable. No lymphadenopathy. Reproductive: Negative. Other: No pelvic free fluid. Musculoskeletal: Prior sternotomy. Chronic right lateral 8th rib fracture is stable. Chronic left L5 pars fracture. No significant spondylolisthesis. No acute or suspicious osseous lesion identified. IMPRESSION: 1. No acute or inflammatory process identified in the abdomen or pelvis. Normal appendix. 2. A small right lung base complex pleural effusion with thickened pleura is mildly larger since June. 3. Aortic Atherosclerosis (ICD10-I70.0). Electronically Signed   By: Genevie Ann M.D.   On: 05/05/2021 04:08   DG Chest Port 1 View  Result Date: 04/28/2021 CLINICAL DATA:  Shortness of breath. EXAM: PORTABLE CHEST 1 VIEW COMPARISON:  April 26, 2021. FINDINGS: The heart size and mediastinal contours are within normal limits. Status post coronary artery bypass graft. Right internal jugular Port-A-Cath is unchanged in position left lung is clear. No pneumothorax is noted. Stable right pleural effusion is noted with associated atelectasis. The visualized skeletal structures are unremarkable. IMPRESSION: Stable mild right pleural effusion with associated right basilar atelectasis or scarring. Electronically Signed   By: Marijo Conception M.D.   On: 04/28/2021 18:33   DG Chest Port 1 View  Result Date: 04/26/2021 CLINICAL DATA:  Status post chemo last week for lung cancer. Presents with shortness of breath and rapid heart rate along with right-sided chest pain and dizziness. EXAM: PORTABLE CHEST 1 VIEW COMPARISON:  March 22, 2021 FINDINGS: There is stable right-sided venous Port-A-Cath  positioning. Multiple sternal wires and vascular clips are noted. Mild atelectasis and/or scarring is seen within the right lung base. There is a small right pleural effusion. No pneumothorax is identified. The heart size and mediastinal contours are within normal limits. A radiopaque fusion plate and screws are seen overlying the lower cervical spine. The visualized skeletal structures are otherwise unremarkable. IMPRESSION: Mild right basilar atelectasis and/or scarring with a small right pleural effusion. Electronically Signed   By: Virgina Norfolk M.D.   On: 04/26/2021 18:21     ASSESSMENT/PLAN:  This is a very pleasant 65 year old Caucasian male diagnosed with stage IV (pT1c, N1, M1c) large cell neuroendocrine carcinoma.  He presented with a right upper lobe lung nodule and hilar lymphadenopathy.   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 His PD-L1 expression is 0%.  He was diagnosed in April 2022. His foundation one testing is negative for any actionable mutations.     The patient completed SRS to the small metastatic brain lesion under the care of Dr. Lisbeth Renshaw on 02/13/2021.  The patient is currently undergoing adjuvant chemotherapy with cisplatin 60 mg per metered squared, on day 1, etoposide 120 mg per metered squared on days 1, 2, and 3 IV every 3 weeks. He received his first dose on 02/09/21.  His last dose was given on 04/20/2021.  The patient had a poor tolerance to chemotherapy with numerous hospitalizations and emergency room visits.  The patient recently had a CT angiogram performed as well as a CT of the abdomen and pelvis.  The patient was seen with Dr. [name].  Dr. [name] personally and independently reviewed the scan and discussed results with the patient today.  The scan showed no evidence for disease progression. Dr. [name] recommends that the patient continue on observation with a restaging CT scan of the chest, abdomen, and pelvis in 3 months.   I will  arrange for the patient to have a scan performed at that time.  We will see him back for follow-up visit a few days later  for evaluation to review the scan results.I will arrange for him to have his port flushed every 6-8 weeks. We will check his magnesium, CBC, and CMP with his next lab appointment as his magnesium is still low. He is currently taking magnesium oxide 1x per day. His magnesium is low at 1.4 today. He was advised to take this BID.   The patient will continue working with radiation oncology for his history of metastatic disease to the brain.  He has a follow-up brain MRI later this week. He is having mild self limiting headaches.   Will continue taking Xarelto for his recent PE.  He will also continue taking Plavix per cardiology.  He is seeing cardiology later this week for tachycardia.   The patient was advised to call immediately if he has any concerning symptoms in the interval. The patient voices understanding of current disease status and treatment options and is in agreement with the current care plan. All questions were answered. The patient knows to call the clinic with any problems, questions or concerns. We can certainly see the patient much sooner if necessary    Orders Placed This Encounter  Procedures   CT Chest W Contrast    Standing Status:   Future    Standing Expiration Date:   05/11/2022    Order Specific Question:   If indicated for the ordered procedure, I authorize the administration of contrast media per Radiology protocol    Answer:   Yes    Order Specific Question:   Preferred imaging location?    Answer:   [facility]   CT Abdomen Pelvis W Contrast    Standing Status:   Future    Standing Expiration Date:   05/11/2022    Order Specific Question:   If indicated for the ordered procedure, I authorize the administration of contrast media per Radiology protocol    Answer:   Yes    Order Specific Question:   Preferred imaging location?    Answer:    [facility]    Order Specific Question:   Is Oral Contrast requested for this exam?    Answer:   Yes, Per Radiology protocol   CBC with Differential ([place] Only)    Standing Status:   Future    Standing Expiration Date:   05/11/2022   CMP ([place] only)    Standing Status:   Future    Standing Expiration Date:   05/11/2022   Magnesium    Standing Status:   Future    Standing Expiration Date:   05/11/2022      Patricie Geeslin L Harjot Zavadil, PA-C 05/11/21  ADDENDUM: Hematology/Oncology Attending: I had a face-to-face encounter with the patient today.  I reviewed his records, labs and scan and recommended his care plan.  This is a very pleasant 65 years old white male with a stage IV (T1c, N1, M1 B) non-small cell lung cancer, large cell neuroendocrine carcinoma presented with right upper lobe lung nodule and hilar lymphadenopathy as well as solitary brain metastasis.  This was diagnosed in April 2022.  The patient status post right upper lobectomy with lymph node dissection followed by SRS to the solitary brain metastasis that was diagnosed after his surgery.  He also underwent 4 cycles of adjuvant systemic chemotherapy with cisplatin and etoposide.  He has a rough time with this treatment with frequent hospitalization and admission for pneumonia and questionable sepsis.  He was also diagnosed with pulmonary embolism and currently on anticoagulation.  The patient had repeat CT scan of the chest, abdomen pelvis performed recently.  I personally and independently reviewed the scans and discussed the results with the patient today. His scan showed no concerning findings for disease recurrence or metastasis. I recommended for the patient to continue on observation from now on with repeat CT scan of the chest, abdomen pelvis and 3 months for restaging of his disease. Regarding the solitary brain metastasis, he underwent SRS and he is followed by the brain tumor team. The patient was  advised to call immediately if he has any other concerning symptoms in the interval. The total time spent in the appointment was 30 minutes. Disclaimer: This note was dictated with voice recognition software. Similar sounding words can inadvertently be transcribed and may be missed upon review. Eilleen Kempf, MD 05/11/21

## 2021-05-08 ENCOUNTER — Ambulatory Visit (HOSPITAL_COMMUNITY): Payer: Medicare Other

## 2021-05-08 ENCOUNTER — Encounter (HOSPITAL_COMMUNITY): Payer: Self-pay

## 2021-05-11 ENCOUNTER — Other Ambulatory Visit: Payer: Self-pay

## 2021-05-11 ENCOUNTER — Inpatient Hospital Stay: Payer: Medicare Other

## 2021-05-11 ENCOUNTER — Inpatient Hospital Stay: Payer: Medicare Other | Attending: Physician Assistant | Admitting: Physician Assistant

## 2021-05-11 ENCOUNTER — Ambulatory Visit: Payer: Medicare Other

## 2021-05-11 VITALS — BP 122/73 | HR 115 | Temp 98.9°F | Resp 19 | Ht 69.0 in | Wt 173.0 lb

## 2021-05-11 DIAGNOSIS — C7B8 Other secondary neuroendocrine tumors: Secondary | ICD-10-CM | POA: Diagnosis not present

## 2021-05-11 DIAGNOSIS — C7A8 Other malignant neuroendocrine tumors: Secondary | ICD-10-CM | POA: Insufficient documentation

## 2021-05-11 DIAGNOSIS — Z7901 Long term (current) use of anticoagulants: Secondary | ICD-10-CM | POA: Diagnosis not present

## 2021-05-11 DIAGNOSIS — C3491 Malignant neoplasm of unspecified part of right bronchus or lung: Secondary | ICD-10-CM

## 2021-05-11 DIAGNOSIS — I2699 Other pulmonary embolism without acute cor pulmonale: Secondary | ICD-10-CM | POA: Diagnosis not present

## 2021-05-11 DIAGNOSIS — Z95828 Presence of other vascular implants and grafts: Secondary | ICD-10-CM

## 2021-05-11 LAB — CBC WITH DIFFERENTIAL (CANCER CENTER ONLY)
Abs Immature Granulocytes: 0.19 10*3/uL — ABNORMAL HIGH (ref 0.00–0.07)
Basophils Absolute: 0.1 10*3/uL (ref 0.0–0.1)
Basophils Relative: 0 %
Eosinophils Absolute: 0.1 10*3/uL (ref 0.0–0.5)
Eosinophils Relative: 1 %
HCT: 31.4 % — ABNORMAL LOW (ref 39.0–52.0)
Hemoglobin: 10.2 g/dL — ABNORMAL LOW (ref 13.0–17.0)
Immature Granulocytes: 1 %
Lymphocytes Relative: 15 %
Lymphs Abs: 2.6 10*3/uL (ref 0.7–4.0)
MCH: 29.9 pg (ref 26.0–34.0)
MCHC: 32.5 g/dL (ref 30.0–36.0)
MCV: 92.1 fL (ref 80.0–100.0)
Monocytes Absolute: 1.7 10*3/uL — ABNORMAL HIGH (ref 0.1–1.0)
Monocytes Relative: 10 %
Neutro Abs: 13.1 10*3/uL — ABNORMAL HIGH (ref 1.7–7.7)
Neutrophils Relative %: 73 %
Platelet Count: 365 10*3/uL (ref 150–400)
RBC: 3.41 MIL/uL — ABNORMAL LOW (ref 4.22–5.81)
RDW: 16.6 % — ABNORMAL HIGH (ref 11.5–15.5)
WBC Count: 17.8 10*3/uL — ABNORMAL HIGH (ref 4.0–10.5)
nRBC: 0 % (ref 0.0–0.2)

## 2021-05-11 LAB — CMP (CANCER CENTER ONLY)
ALT: 8 U/L (ref 0–44)
AST: 9 U/L — ABNORMAL LOW (ref 15–41)
Albumin: 3.5 g/dL (ref 3.5–5.0)
Alkaline Phosphatase: 123 U/L (ref 38–126)
Anion gap: 11 (ref 5–15)
BUN: 14 mg/dL (ref 8–23)
CO2: 25 mmol/L (ref 22–32)
Calcium: 9.7 mg/dL (ref 8.9–10.3)
Chloride: 102 mmol/L (ref 98–111)
Creatinine: 1.19 mg/dL (ref 0.61–1.24)
GFR, Estimated: >60 mL/min (ref >60)
Glucose, Bld: 111 mg/dL — ABNORMAL HIGH (ref 70–99)
Potassium: 4.3 mmol/L (ref 3.5–5.1)
Sodium: 138 mmol/L (ref 135–145)
Total Bilirubin: 0.5 mg/dL (ref 0.3–1.2)
Total Protein: 7 g/dL (ref 6.5–8.1)

## 2021-05-11 LAB — MAGNESIUM: Magnesium: 1.4 mg/dL — ABNORMAL LOW (ref 1.7–2.4)

## 2021-05-11 MED ORDER — SODIUM CHLORIDE 0.9% FLUSH
10.0000 mL | Freq: Once | INTRAVENOUS | Status: AC
  Administered 2021-05-11: 10 mL

## 2021-05-11 MED ORDER — HEPARIN SOD (PORK) LOCK FLUSH 100 UNIT/ML IV SOLN
500.0000 [IU] | Freq: Once | INTRAVENOUS | Status: AC
  Administered 2021-05-11: 500 [IU]

## 2021-05-12 ENCOUNTER — Telehealth: Payer: Self-pay | Admitting: Physician Assistant

## 2021-05-12 ENCOUNTER — Ambulatory Visit: Payer: Medicare Other

## 2021-05-12 NOTE — Progress Notes (Signed)
Cardiology Office Note   Date:  05/13/2021   ID:  [name], DOB 04/16/1956, MRN [id number]  PCP:  [name], MD    No chief complaint on file.  CAD  Wt Readings from Last 3 Encounters:  05/13/21 174 lb 12.8 oz (79.3 kg)  05/11/21 173 lb (78.5 kg)  05/04/21 175 lb (79.4 kg)       History of Present Illness: [name] is a 65 y.o. male  with CAD, s/p anterior STEMI and emergent CABG in 2017.     He has had multiple episodes of chest pain since then and multiple caths in 2018 and a stress test not resulting in PCI.  He admits to having had some anxiety as well.  Medications have been difficult to tolerate for his anxiety at times.    In 2/19, he had a cath and PCI of the RCA due to narrowing of the graft to the distal RCA.    Seen 02/2018 for presyncope not felt to be cardiac in origin.    Patient was in the ER 06/09/2018 with palpitations and heart rate up to 120 but normal when he arrived.  Also had atypical chest pain anxiety was felt to be a component and he requested narcotics for relief. Event monitor 06/13/2018 normal sinus rhythm with occasional sinus bradycardia and sinus tachycardia.  No atrial fibrillation or pathologic arrhythmias.   Patient back in the ER 09/28/2018 for atypical chest pain that was reproducible.  2D echo normal LVEF 55 to 60% with no valvular problems.  CTA no evidence of aortic dissection, mild prominence of the ascending aorta at 3.9 x 3.9 cm.   In 09/2018, Patient had C7-T1 anterior cervical fusion done by [name] with [facility] neurosurgery and spine.     Since the RCA stent, he felt much better.  He developed more chest pain in 03/2019 which led to a cath: Mid LAD lesion is 55% stenosed. Ost LAD to Prox LAD lesion is 50% stenosed. LIMA to LAD is patent. Ost Ramus lesion is 90% stenosed. Ramus lesion is 75% stenosed. Ost Cx lesion is 80% stenosed. Prox Cx to Mid Cx lesion is 50% stenosed. SVG to OM is patent. Dist RCA  lesion is 50% stenosed. RPDA lesion is 40% stenosed. SVG to PDA is patent with proximal disease that is unchanged. Previously placed Ost RCA to Prox RCA drug eluting stent is widely patent. Graft to the diagonal was occluded. There was TIMI 3 flow in the small diagonal with proximal ectasia. The left ventricular systolic function is normal. LV end diastolic pressure is normal. LVEDP 11 mm Hg. The left ventricular ejection fraction is 50-55% by visual estimate. There is no aortic valve stenosis.   Continue medical therapy.     No new PCI was done.   He had COVID in 09/2020. He was not hospitalized.     Went to hospital on 10/24/20 for chest pain.  Negative troponins.  CT showed: "CT scan does not show evidence of pulmonary embolism but does show this large, 1.7 cm mass that is likely a primary bronchogenic carcinoma. "  Hospitalized in 03/2021: "SIRS-likely from dehydration and anemia versus infectious process.  COVID-19 PCR and blood cultures NGTD.  MRSA PCR screen negative.  SIRS resolved except for mild leukopenia.  Sepsis ruled out.   Near syncope/hypotension-likely from dehydration and anemia.  Orthostatic vitals negative.  Transfused 1 unit with appropriate response.  Resolved.     Symptomatic anemia/pancytopenia-likely from chemotherapy.  He is on Xarelto and DAPT but denies melena or hematochezia.  Anemia panel without significant finding.  Transfused 1 unit with appropriate response.  H&H remained stable.  Has some brain mets, small spots.  MRI pending.   Recent Labs (within last 365 days)              Recent Labs    03/30/21 0803 04/01/21 0809 04/07/21 1409 04/13/21 0840 04/20/21 0746 04/26/21 1829 04/28/21 1751 04/29/21 0237 04/29/21 1734 04/30/21 0252  HGB 8.0* 7.2* 10.1* 10.4* 10.0* 9.9* 9.6* 7.2* 8.9* 9.3*    -Recheck CBC at follow-up -Discontinued aspirin given risk for bleeding while on Xarelto and Plavix -Advised to avoid NSAIDs including Aleve.   Recent  bilateral subsegmental PE -Continue starter pack Xarelto.   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 Earlie Server.  Last chemo on 8/22. 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  Troponin negative. -Discontinued aspirin given risk for GI bleed. -Continue Toprol XL, Ranexa and Plavix   AKI: Resolved. Recent Labs (within last 365 days)              Recent Labs    03/25/21 0500 03/30/21 0803 04/01/21 0809 04/07/21 1409 04/13/21 0840 04/20/21 0746 04/26/21 1829 04/28/21 1751 04/29/21 0237 04/30/21 0252  BUN 15 15 22 21 17  27* 26* 23 18 13   CREATININE 1.02 1.00 0.92 0.92 0.97 0.96 1.03 1.26* 0.98 0.80    -Recheck renal function in 1 to 2 weeks   Paroxysmal A. fib: In sinus rhythm but tachycardic. -Continue home amiodarone, Toprol-XL and Xarelto   Thrombocytopenia: Likely from chemotherapy.  Relatively stable."  Fatigued.  No angina.  Appetite improving.    Hoping to gain some weight.  Denies : Dizziness. Leg edema. Nitroglycerin use. Orthopnea. Palpitations. Paroxysmal nocturnal dyspnea.  Syncope.    Past Medical History:  Diagnosis Date   Anxiety    Arthritis    Basal cell carcinoma (BCC) of forehead    CAD (coronary artery disease)    a. 10/2015 ant STEMI >> LHC with 3 v CAD; oLAD tx with POBA >> emergent CABG. b. Multiple evals since that time, early graft failure of SVG-RCA by cath 03/2016. c. 2/19 PCI/DES x1 to pRCA, normal EF.   Carotid artery disease ([place])    a. 40-59% BICA 02/2018.   Depression    Dyspnea    Ectopic atrial tachycardia (HCC)    Esophageal reflux    eosinophil esophagitis   Family history of adverse reaction to anesthesia    "sister has PONV" (06/21/2017)   Former tobacco use    Gout    Hepatitis C    "treated and cured" (06/21/2017)   High cholesterol    History of kidney stones    Hypertension     Ischemic cardiomyopathy    a. EF 25-30% at intraop TEE 4/17  //  b. Limited Echo 5/17 - EF 45-50%, mild ant HK. c. EF 55-65% by cath 09/2017.   Migraine    "3-4/yr" (06/21/2017)   Myocardial infarction ([place]) 10/2015   Palpitations    Sinus bradycardia    a. HR dropping into 40s in 02/2016 -> BB reduced.   Stroke [facility]) 10/2016   "small one; sometimes my memory/cognitive issues" (06/21/2017)   Symptomatic hypotension    a. 02/2016 ER visit -> meds reduced.   Syncope    Wears dentures  Wears glasses     Past Surgical History:  Procedure Laterality Date   25 GAUGE PARS PLANA VITRECTOMY WITH 20 GAUGE MVR PORT  12/31/2020   Procedure: 25 GAUGE PARS PLANA VITRECTOMY WITH 20 GAUGE MVR PORT;  Surgeon: [name], MD;  Location: [place];  Service: Thoracic;;   ANTERIOR CERVICAL DECOMP/DISCECTOMY FUSION N/A 10/17/2018   Procedure: Anterior Cervical Decompression Fusion - Cervical seven -Thoracic one;  Surgeon: Consuella Lose, MD;  Location: [place];  Service: Neurosurgery;  Laterality: N/A;   BASAL CELL CARCINOMA EXCISION     "forehead   BIOPSY  07/20/2019   Procedure: BIOPSY;  Surgeon: Carol Ada, MD;  Location: [facility];  Service: Endoscopy;;   BRONCHIAL BIOPSY  12/24/2020   Procedure: BRONCHIAL BIOPSIES;  Surgeon: Garner Nash, DO;  Location: [place] [facility];  Service: Pulmonary;;   BRONCHIAL BRUSHINGS  12/24/2020   Procedure: BRONCHIAL BRUSHINGS;  Surgeon: Garner Nash, DO;  Location: [place];  Service: Pulmonary;;   BRONCHIAL NEEDLE ASPIRATION BIOPSY  12/24/2020   Procedure: BRONCHIAL NEEDLE ASPIRATION BIOPSIES;  Surgeon: Garner Nash, DO;  Location: [place];  Service: Pulmonary;;   CARDIAC CATHETERIZATION N/A 11/28/2015   Procedure: Left Heart Cath and Coronary Angiography;  Surgeon: [name], MD;  Location: [place] [facility];  Service: Cardiovascular;  Laterality: N/A;   CARDIAC CATHETERIZATION N/A 11/28/2015   Procedure: Coronary Balloon  Angioplasty;  Surgeon: [name], MD;  Location: [place] [facility];  Service: Cardiovascular;  Laterality: N/A;  ostial LAD   CARDIAC CATHETERIZATION N/A 11/28/2015   Procedure: Coronary/Graft Angiography;  Surgeon: [name], MD;  Location: [place] [facility];  Service: Cardiovascular;  Laterality: N/A;  coronaries only    CARDIAC CATHETERIZATION N/A 04/21/2016   Procedure: Left Heart Cath and Coronary Angiography;  Surgeon: Wellington Hampshire, MD;  Location: [place] [facility];  Service: Cardiovascular;  Laterality: N/A;   CARDIAC CATHETERIZATION N/A 06/14/2016   Procedure: Left Heart Cath and Cors/Grafts Angiography;  Surgeon: Lorretta Harp, MD;  Location: [place] [facility];  Service: Cardiovascular;  Laterality: N/A;   CARDIAC CATHETERIZATION N/A 09/08/2016   Procedure: Left Heart Cath and Cors/Grafts Angiography;  Surgeon: Wellington Hampshire, MD;  Location: [place] [facility];  Service: Cardiovascular;  Laterality: N/A;   CARDIAC CATHETERIZATION     CORONARY ARTERY BYPASS GRAFT N/A 11/28/2015   Procedure: CORONARY ARTERY BYPASS GRAFTING (CABG) TIMES FIVE USING LEFT INTERNAL MAMMARY ARTERY AND RIGHT GREATER SAPHENOUS,VIEN HARVEATED BY ENDOVIEN, INTRAOPPRATIVE TEE;  Surgeon: Gaye Pollack, MD;  Location: [place];  Service: Open Heart Surgery;  Laterality: N/A;   CORONARY STENT INTERVENTION N/A 10/05/2017   Procedure: CORONARY STENT INTERVENTION;  Surgeon: [name], MD;  Location: [place] [facility];  Service: Cardiovascular;  Laterality: N/A;   ESOPHAGOGASTRODUODENOSCOPY (EGD) WITH PROPOFOL N/A 07/20/2019   Procedure: ESOPHAGOGASTRODUODENOSCOPY (EGD) WITH PROPOFOL;  Surgeon: Carol Ada, MD;  [facility];  Service: Endoscopy;  Laterality: N/A;   ESOPHAGOGASTRODUODENOSCOPY (EGD) WITH PROPOFOL N/A 01/30/2021   Procedure: ESOPHAGOGASTRODUODENOSCOPY (EGD) WITH PROPOFOL;  Surgeon: Carol Ada, MD;  Location: [facility];  Service: Endoscopy;  Laterality: N/A;    FIDUCIAL MARKER PLACEMENT  12/24/2020   Procedure: FIDUCIAL DYE MARKER PLACEMENT;  Surgeon: Garner Nash, DO;  Location: [place] [facility];  Service: Pulmonary;;   HUMERUS SURGERY Right 1969   "tumor inside bone; filled it w/bone chips"   INTERCOSTAL NERVE BLOCK Right 12/24/2020   Procedure: INTERCOSTAL NERVE BLOCK;  Surgeon:  [name], MD;  Location: [facility];  Service: Thoracic;  Laterality: Right;   IR IMAGING GUIDED PORT INSERTION  02/06/2021   LEFT HEART CATH AND CORS/GRAFTS ANGIOGRAPHY N/A 03/11/2017   Procedure: Left Heart Cath and Cors/Grafts Angiography;  Surgeon: Leonie Man, MD;  Location: [place] [facility];  Service: Cardiovascular;  Laterality: N/A;   LEFT HEART CATH AND CORS/GRAFTS ANGIOGRAPHY N/A 10/05/2017   Procedure: LEFT HEART CATH AND CORS/GRAFTS ANGIOGRAPHY;  Surgeon: [name], MD;  Location: [place] [facility];  Service: Cardiovascular;  Laterality: N/A;   LEFT HEART CATH AND CORS/GRAFTS ANGIOGRAPHY N/A 04/11/2019   Procedure: LEFT HEART CATH AND CORS/GRAFTS ANGIOGRAPHY;  Surgeon: [name], MD;  Location: [place] [facility];  Service: Cardiovascular;  Laterality: N/A;   NODE DISSECTION Right 12/24/2020   Procedure: NODE DISSECTION;  Surgeon: [name], MD;  Location: [place];  Service: Thoracic;  Laterality: Right;   PERIPHERAL VASCULAR CATHETERIZATION N/A 06/14/2016   Procedure: Lower Extremity Angiography;  Surgeon: Lorretta Harp, MD;  Location: [place] [facility];  Service: Cardiovascular;  Laterality: N/A;   VIDEO BRONCHOSCOPY WITH ENDOBRONCHIAL NAVIGATION Right 12/24/2020   Procedure: VIDEO BRONCHOSCOPY WITH ENDOBRONCHIAL NAVIGATION;  Surgeon: Garner Nash, DO;  Location: [place];  Service: Pulmonary;  Laterality: Right;   VIDEO BRONCHOSCOPY WITH ENDOBRONCHIAL ULTRASOUND N/A 12/24/2020   Procedure: VIDEO BRONCHOSCOPY WITH ENDOBRONCHIAL ULTRASOUND;  Surgeon: Garner Nash, DO;  Location: [place];  Service:  Pulmonary;  Laterality: N/A;   VIDEO BRONCHOSCOPY WITH INSERTION OF INTERBRONCHIAL VALVE (IBV) N/A 12/31/2020   Procedure: VIDEO BRONCHOSCOPY WITH INSERTION OF INTERBRONCHIAL VALVE (IBV).VALVE IN CARTRIDGE 56mm,9mm. CHEST TUBE PLACEMENT.;  Surgeon: [name], MD;  [facility];  Service: Thoracic;  Laterality: N/A;     Current Outpatient Medications  Medication Sig Dispense Refill   acetaminophen (TYLENOL) 500 MG tablet Take 1,000 mg by mouth every 6 (six) hours as needed for moderate pain.     amiodarone (PACERONE) 200 MG tablet Take 1 tablet (200 mg total) by mouth daily. 30 tablet 1   amLODipine (NORVASC) 10 MG tablet TAKE 1 TABLET(10 MG) BY MOUTH DAILY 90 tablet 1   atorvastatin (LIPITOR) 80 MG tablet TAKE 1 TABLET(80 MG) BY MOUTH DAILY 90 tablet 2   cloNIDine (CATAPRES) 0.1 MG tablet Take 0.1 mg by mouth daily as needed (sleep).     clopidogrel (PLAVIX) 75 MG tablet TAKE 1 TABLET BY MOUTH EVERY DAY (Patient taking differently: Take 75 mg by mouth daily.) 90 tablet 1   lidocaine-prilocaine (EMLA) cream Apply 1 application topically once as needed (port access).     lisinopril (ZESTRIL) 5 MG tablet Take 5 mg by mouth daily.     magnesium oxide (MAG-OX) 400 (240 Mg) MG tablet Take 1 tablet (400 mg total) by mouth daily. (Patient taking differently: Take 400 mg by mouth 2 (two) times daily.) 30 tablet 1   metoprolol succinate (TOPROL-XL) 25 MG 24 hr tablet Take 1 tablet (25 mg total) by mouth daily. 90 tablet 3   nitroGLYCERIN (NITROSTAT) 0.4 MG SL tablet PLACE 1 TABLET UNDER THE TONGUE EVERY 5 MINUTES AS NEEDED FOR CHEST PAIN. 3 DOSES MAX 25 tablet 4   ondansetron (ZOFRAN) 8 MG tablet Take 1 tablet (8 mg total) by mouth every 8 (eight) hours as needed for nausea or vomiting. Starting 3 days after chemotherapy 30 tablet 2   oxyCODONE (OXY IR/ROXICODONE) 5 MG immediate release tablet Take 1 tablet (5 mg total)  by mouth every 4 (four) hours as needed for moderate pain. 30 tablet 0    pantoprazole (PROTONIX) 40 MG tablet Take 1 tablet (40 mg total) by mouth daily. 14 tablet 0   prochlorperazine (COMPAZINE) 10 MG tablet Take 1 tablet (10 mg total) by mouth every 6 (six) hours as needed. 30 tablet 2   ranolazine (RANEXA) 1000 MG SR tablet Take 1 tablet (1,000 mg total) by mouth 2 (two) times daily. 180 tablet 0   RIVAROXABAN (XARELTO) VTE STARTER PACK (15 & 20 MG) Take one 15 mg tablet by mouth 2 (two) times daily. On day 22, switch to one 20 mg tablet once a day. Take with Food. 51 each 0   Tiotropium Bromide-Olodaterol (STIOLTO RESPIMAT) 2.5-2.5 MCG/ACT AERS Inhale 2 puffs into the lungs daily. 4 g 3   No current facility-administered medications for this visit.    Allergies:   Prednisone, Tetanus toxoids, Wellbutrin [bupropion], Indomethacin, Other, and Varenicline    Social History:  The patient  reports that he quit smoking about 5 years ago. His smoking use included cigarettes. He has a 33.00 pack-year smoking history. He has never used smokeless tobacco. He reports current alcohol use. He reports that he does not use drugs.   Family History:  The patient's family history includes Heart Problems in his father; Heart attack in his maternal grandmother and paternal uncle; Heart attack (age of onset: 53) in his father; Heart failure in his father; Hypertension in his brother; Lung cancer in his mother; Stroke in his father and maternal grandmother.    ROS:  Please see the history of present illness.   Otherwise, review of systems are positive for fatigue with cancer therapy, chest pain after PE.   All other systems are reviewed and negative.    PHYSICAL EXAM: VS:  BP 126/76   Pulse (!) 104   Ht 5\' 9"  (1.753 m)   Wt 174 lb 12.8 oz (79.3 kg)   SpO2 97%   BMI 25.81 kg/m  , BMI Body mass index is 25.81 kg/m. GEN: Well nourished, well developed, in no acute distress HEENT: normal Neck: no JVD, carotid bruits, or masses Cardiac: RRR; no murmurs, rubs, or gallops,no  edema  Respiratory:  clear to auscultation bilaterally, normal work of breathing GI: soft, nontender, nondistended, + BS MS: no deformity or atrophy Skin: warm and dry, no rash Neuro:  Strength and sensation are intact Psych: euthymic mood, full affect   EKG:   The ekg ordered today demonstrates sinus tachy, no ST changes   Recent Labs: 12/26/2020: TSH 0.847 04/28/2021: B Natriuretic Peptide 95.2 05/11/2021: ALT 8; BUN 14; Creatinine 1.19; Hemoglobin 10.2; Magnesium 1.4; Platelet Count 365; Potassium 4.3; Sodium 138   Lipid Panel    Component Value Date/Time   CHOL 82 12/27/2020 0125   CHOL 114 11/08/2017 0833   TRIG 97 12/27/2020 0125   HDL 35 (L) 12/27/2020 0125   HDL 52 11/08/2017 0833   CHOLHDL 2.3 12/27/2020 0125   VLDL 19 12/27/2020 0125   LDLCALC 28 12/27/2020 0125   LDLCALC 43 11/08/2017 0833     Other studies Reviewed: Additional studies/ records that were reviewed today with results demonstrating: hospital records.   ASSESSMENT AND PLAN:  CAD/Old MI: No angina on medical therapy. No aspirin.  Tolerating Plavix.  No NTG use recently.  Anemia: Could stop Plavix if bleeding became an issue.  Nuisance bleeding. Iron studies done on 04/29/21. PAF: In NSR.  PE: continue Xarelto. Watch for any  internal bleeding. Sinus tachycardia.  HTN: Trying to verify lisinopril use.  Take meds for BP as he is taking which he says includes lisinopril and amlodipine.  Can change if BP gets too low.    Current medicines are reviewed at length with the patient today.  The patient concerns regarding his medicines were addressed.  The following changes have been made:  No change  Labs/ tests ordered today include:  No orders of the defined types were placed in this encounter.   Recommend 150 minutes/week of aerobic exercise Low fat, low carb, high fiber diet recommended  Disposition:   FU in 6 months   Signed, Larae Grooms, MD  05/13/2021 3:05 PM    [facility] [address], [place], [place]  [address] Phone: [phone]; Fax: [phone]

## 2021-05-12 NOTE — Telephone Encounter (Signed)
Scheduled appointment per 09/12 los. Patient is aware.

## 2021-05-13 ENCOUNTER — Ambulatory Visit: Payer: Medicare Other | Admitting: Interventional Cardiology

## 2021-05-13 ENCOUNTER — Ambulatory Visit: Payer: Medicare Other

## 2021-05-13 ENCOUNTER — Other Ambulatory Visit: Payer: Self-pay

## 2021-05-13 ENCOUNTER — Encounter: Payer: Self-pay | Admitting: Interventional Cardiology

## 2021-05-13 ENCOUNTER — Ambulatory Visit (INDEPENDENT_AMBULATORY_CARE_PROVIDER_SITE_OTHER): Payer: Medicare Other | Admitting: Interventional Cardiology

## 2021-05-13 VITALS — BP 126/76 | HR 104 | Ht 69.0 in | Wt 174.8 lb

## 2021-05-13 DIAGNOSIS — E785 Hyperlipidemia, unspecified: Secondary | ICD-10-CM

## 2021-05-13 DIAGNOSIS — I10 Essential (primary) hypertension: Secondary | ICD-10-CM | POA: Diagnosis not present

## 2021-05-13 DIAGNOSIS — I252 Old myocardial infarction: Secondary | ICD-10-CM

## 2021-05-13 DIAGNOSIS — I480 Paroxysmal atrial fibrillation: Secondary | ICD-10-CM | POA: Diagnosis not present

## 2021-05-13 DIAGNOSIS — I25119 Atherosclerotic heart disease of native coronary artery with unspecified angina pectoris: Secondary | ICD-10-CM | POA: Diagnosis not present

## 2021-05-13 NOTE — Patient Instructions (Signed)
Medication Instructions:  Your physician recommends that you continue on your current medications as directed. Please refer to the Current Medication list given to you today.  *If you need a refill on your cardiac medications before your next appointment, please call your pharmacy*   Lab Work: none If you have labs (blood work) drawn today and your tests are completely normal, you will receive your results only by: [place] (if you have MyChart) OR A paper copy in the mail If you have any lab test that is abnormal or we need to change your treatment, we will call you to review the results.   Testing/Procedures: none   Follow-Up: At [facility], you and your health needs are our priority.  As part of our continuing mission to provide you with exceptional heart care, we have created designated Provider Care Teams.  These Care Teams include your primary Cardiologist (physician) and Advanced Practice Providers (APPs -  Physician Assistants and Nurse Practitioners) who all work together to provide you with the care you need, when you need it.  We recommend signing up for the patient portal called "MyChart".  Sign up information is provided on this After Visit Summary.  MyChart is used to connect with patients for Virtual Visits (Telemedicine).  Patients are able to view lab/test results, encounter notes, upcoming appointments, etc.  Non-urgent messages can be sent to your provider as well.   To learn more about what you can do with MyChart, go to [email or web address].    Your next appointment:   March 16,2023 at 8:00  The format for your next appointment:   In Person  Provider:   Casandra Doffing, MD   Other Instructions Please check your lisinopril bottle when you get home and call office back with the dose you have been taking

## 2021-05-15 ENCOUNTER — Ambulatory Visit
Admission: RE | Admit: 2021-05-15 | Discharge: 2021-05-15 | Disposition: A | Discharge status: Home / Self Care | Payer: Medicare Other | Source: Ambulatory Visit | Attending: Radiation Oncology | Admitting: Radiation Oncology

## 2021-05-15 ENCOUNTER — Ambulatory Visit: Payer: Medicare Other

## 2021-05-15 DIAGNOSIS — C7931 Secondary malignant neoplasm of brain: Secondary | ICD-10-CM

## 2021-05-15 IMAGING — DX DG CHEST 1V PORT
1 series · 1 of 1 positions shown · non-contrast
Comparison: April 11, 2019

CLINICAL DATA: Cough

EXAM:
PORTABLE CHEST 1 VIEW

[chest ap]
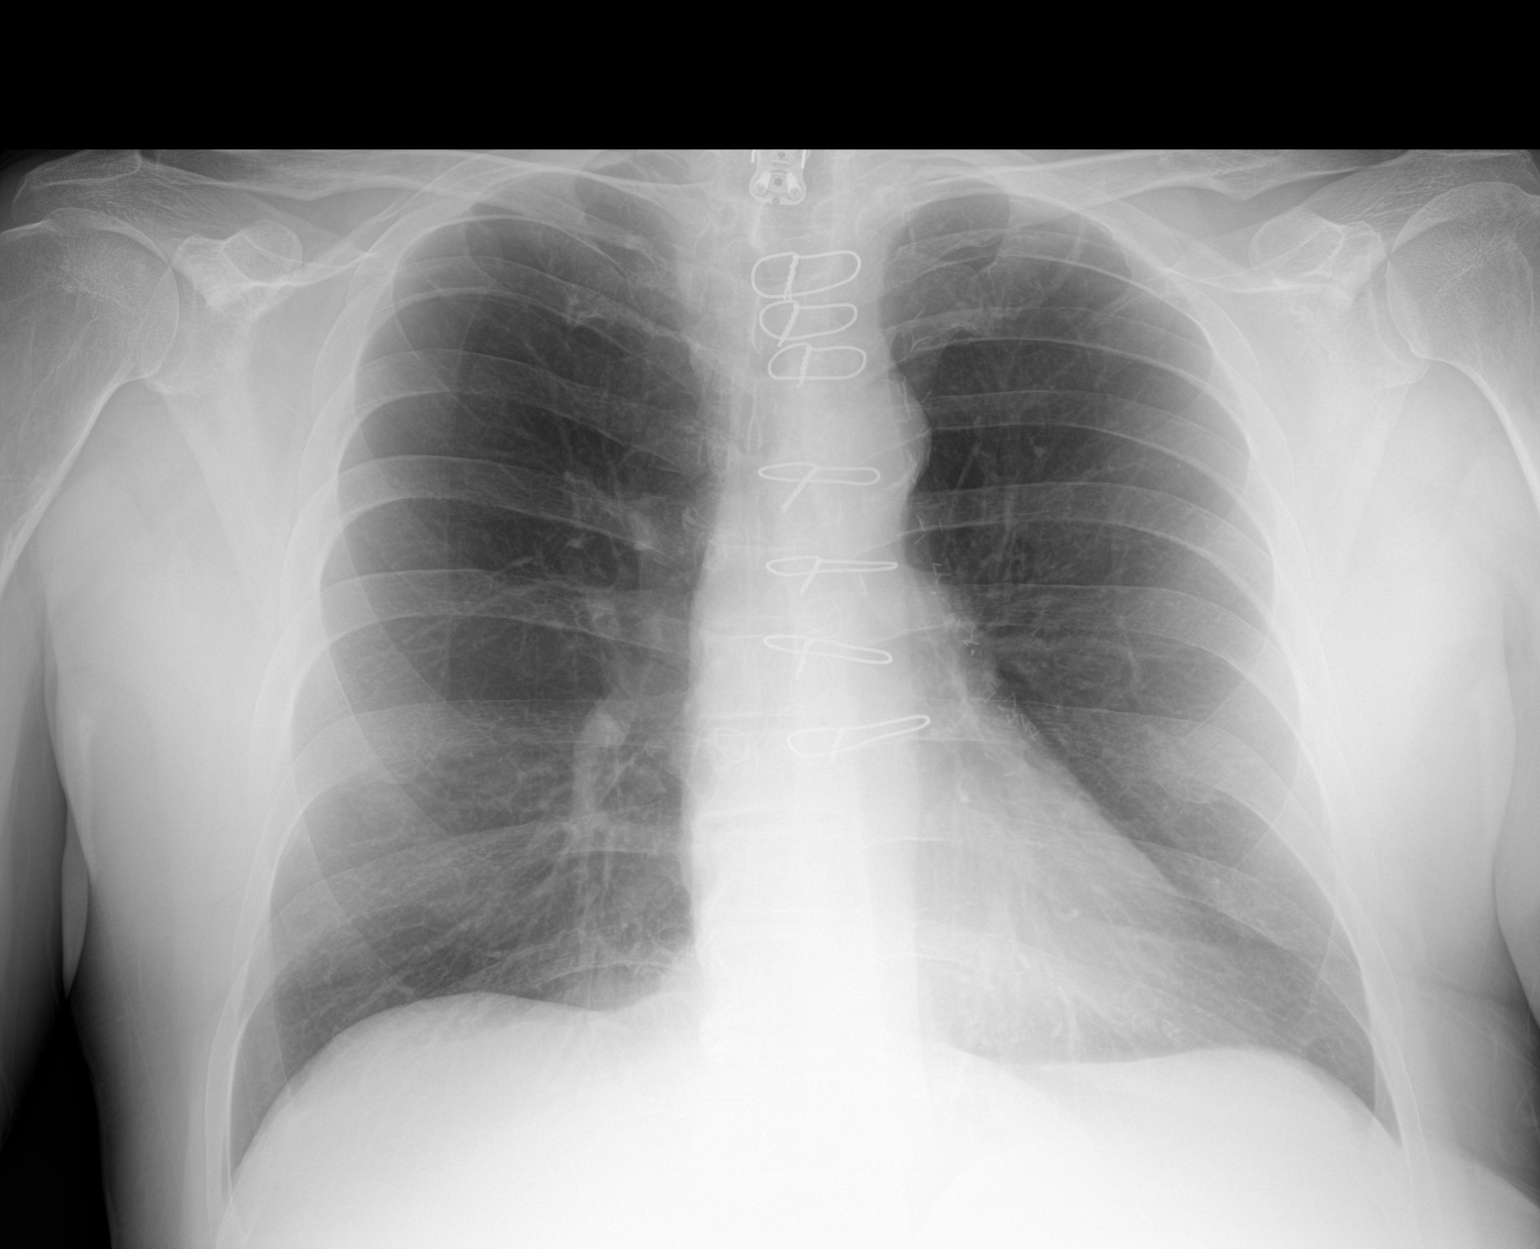

[1 of 1 positions shown; findings below may reference images not displayed]

FINDINGS: Lungs are clear. Heart size and pulmonary vascularity are normal. No
adenopathy. Patient is status post coronary artery bypass grafting.
There is aortic atherosclerosis. Postoperative changes noted in the
lower cervical region.
IMPRESSION: Postoperative changes. No edema or consolidation. Heart size normal.
Aortic Atherosclerosis ([id number].M).

## 2021-05-15 MED ORDER — GADOBENATE DIMEGLUMINE 529 MG/ML IV SOLN
15.0000 mL | Freq: Once | INTRAVENOUS | Status: AC | PRN
  Administered 2021-05-15: 15 mL via INTRAVENOUS

## 2021-05-15 MED ORDER — HEPARIN SOD (PORK) LOCK FLUSH 100 UNIT/ML IV SOLN
500.0000 [IU] | Freq: Once | INTRAVENOUS | Status: AC
  Administered 2021-05-15: 500 [IU] via INTRAVENOUS

## 2021-05-15 MED ORDER — SODIUM CHLORIDE 0.9% FLUSH
10.0000 mL | INTRAVENOUS | Status: DC | PRN
  Administered 2021-05-15: 10 mL via INTRAVENOUS

## 2021-05-18 ENCOUNTER — Encounter: Payer: Self-pay | Admitting: Radiation Oncology

## 2021-05-18 ENCOUNTER — Ambulatory Visit
Admission: RE | Admit: 2021-05-18 | Discharge: 2021-05-18 | Disposition: A | Discharge status: Home / Self Care | Payer: Medicare Other | Source: Ambulatory Visit | Attending: Radiation Oncology | Admitting: Radiation Oncology

## 2021-05-18 ENCOUNTER — Telehealth: Payer: Self-pay | Admitting: Interventional Cardiology

## 2021-05-18 DIAGNOSIS — C7A1 Malignant poorly differentiated neuroendocrine tumors: Secondary | ICD-10-CM

## 2021-05-18 DIAGNOSIS — C3491 Malignant neoplasm of unspecified part of right bronchus or lung: Secondary | ICD-10-CM

## 2021-05-18 DIAGNOSIS — C7931 Secondary malignant neoplasm of brain: Secondary | ICD-10-CM

## 2021-05-18 NOTE — Telephone Encounter (Signed)
Left the pt a message to call the office back.  He can speak with any available triage nurse.

## 2021-05-18 NOTE — Progress Notes (Signed)
Patient reports headaches, nausea/ vomiting, vision disturbance (in AM), occasional shortness of breath, and some episodes of  constipation.  Meaningful use complete.  Patient notified of 2:30-9/19/22 telephone appointment and understands.

## 2021-05-18 NOTE — Telephone Encounter (Signed)
Pt c/o medication issue:  1. Name of Medication: lisinopril (ZESTRIL) 5 MG tablet  2. How are you currently taking this medication (dosage and times per day)? As directed  3. Are you having a reaction (difficulty breathing--STAT)?   4. What is your medication issue? Patient is calling to report dosage of medication he was  taking. It was not on his med list at his last appt 05/13/21

## 2021-05-18 NOTE — Progress Notes (Signed)
Radiation Oncology         ([phone] ________________________________  Outpatient Follow Up - Conducted via telephone due to current COVID-19 concerns for limiting patient exposure  I spoke with the patient to conduct this consult visit via telephone to spare the patient unnecessary potential exposure in the healthcare setting during the current COVID-19 pandemic. The patient was notified in advance and was offered a [place] meeting to allow for face to face communication but unfortunately reported that they did not have the appropriate resources/technology to support such a visit and instead preferred to proceed with a telephone visit.    Name: [name]        MRN: [id number]  Date of Service: 05/18/2021 DOB: 09-26-1955  BO:[id number], Annie Main, MD  [name], MD     REFERRING PHYSICIAN: [name], MD   DIAGNOSIS: The primary encounter diagnosis was 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[place]). Diagnoses of Large cell carcinoma of right lung ([place]) and Secondary malignant neoplasm of brain [facility]) were also pertinent to this visit.   HISTORY OF PRESENT ILLNESS: [name] is a 65 y.o. 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 The patient was diagnosed in April 2022 with his lung carcinoma at the time of undergoing right upper lobectomy. Final pathology showed nodal involvement of the hilar lymph node, and a primary tumor measuring 2.4 cm, margins were negative.  He started cisplatin and etoposide given the neuroendocrine features.  He underwent MRI of the brain on 01/28/2020 which revealed an enhancing 6.5 mm lesion in the left lateral cerebellum consistent with metastasis.  Given these findings, he was worked up for and proceeded with stereotactic radiosurgery [facility]) which he completed in June 2022.   Since his last visit, the patient has continued with systemic platinum and etoposide  based chemotherapy which has been poorly tolerated.  Fortunately his most recent CT staging showed no evidence of progression and he will now be followed in observation.  He had an MRI scan of the brain on 05/15/2021 which showed interval decrease in the left cerebellar metastasis and decreased surrounding edema the previously noted site in the left frontal lobe was no longer seen and no new evidence of disease was identified.  He is contacted today to review these results.    PREVIOUS RADIATION THERAPY:   02/13/2021 through 02/13/2021 SRS Treatment: Site Technique Total Dose (Gy) Dose per Fx (Gy) Completed Fx Beam Energies  Brain: PTV_1CerebLt49mm 3D 20/20 20 1/1 6XFFF  Brain: PTV_2 Lt Frontal 74mm 3D 20/20 20 1/1 6XFFF     PAST MEDICAL HISTORY:  Past Medical History:  Diagnosis Date   Anxiety    Arthritis    Basal cell carcinoma (BCC) of forehead    CAD (coronary artery disease)    a. 10/2015 ant STEMI >> LHC with 3 v CAD; oLAD tx with POBA >> emergent CABG. b. Multiple evals since that time, early graft failure of SVG-RCA by cath 03/2016. c. 2/19 PCI/DES x1 to pRCA, normal EF.   Carotid artery disease ([place])    a. 40-59% BICA 02/2018.   Depression    Dyspnea    Ectopic atrial tachycardia (HCC)    Esophageal reflux    eosinophil esophagitis   Family history of adverse reaction to anesthesia    "sister has PONV" (06/21/2017)   Former tobacco use    Gout    Hepatitis C    "treated and cured" (06/21/2017)  High cholesterol    History of kidney stones    Hypertension    Ischemic cardiomyopathy    a. EF 25-30% at intraop TEE 4/17  //  b. Limited Echo 5/17 - EF 45-50%, mild ant HK. c. EF 55-65% by cath 09/2017.   Migraine    "3-4/yr" (06/21/2017)   Myocardial infarction ([place]) 10/2015   Palpitations    Sinus bradycardia    a. HR dropping into 40s in 02/2016 -> BB reduced.   Stroke [facility]) 10/2016   "small one; sometimes my memory/cognitive issues" (06/21/2017)   Symptomatic  hypotension    a. 02/2016 ER visit -> meds reduced.   Syncope    Wears dentures    Wears glasses        PAST SURGICAL HISTORY: Past Surgical History:  Procedure Laterality Date   25 GAUGE PARS PLANA VITRECTOMY WITH 20 GAUGE MVR PORT  12/31/2020   Procedure: 25 GAUGE PARS PLANA VITRECTOMY WITH 20 GAUGE MVR PORT;  Surgeon: [name], MD;  Location: [place];  Service: Thoracic;;   ANTERIOR CERVICAL DECOMP/DISCECTOMY FUSION N/A 10/17/2018   Procedure: Anterior Cervical Decompression Fusion - Cervical seven -Thoracic one;  Surgeon: Consuella Lose, MD;  Location: [place];  Service: Neurosurgery;  Laterality: N/A;   BASAL CELL CARCINOMA EXCISION     "forehead   BIOPSY  07/20/2019   Procedure: BIOPSY;  Surgeon: Carol Ada, MD;  [facility]: [facility];  Service: Endoscopy;;   BRONCHIAL BIOPSY  12/24/2020   Procedure: BRONCHIAL BIOPSIES;  Surgeon: Garner Nash, DO;  Location: [place] [facility];  Service: Pulmonary;;   BRONCHIAL BRUSHINGS  12/24/2020   Procedure: BRONCHIAL BRUSHINGS;  Surgeon: Garner Nash, DO;  Location: [place];  Service: Pulmonary;;   BRONCHIAL NEEDLE ASPIRATION BIOPSY  12/24/2020   Procedure: BRONCHIAL NEEDLE ASPIRATION BIOPSIES;  Surgeon: Garner Nash, DO;  Location: [place];  Service: Pulmonary;;   CARDIAC CATHETERIZATION N/A 11/28/2015   Procedure: Left Heart Cath and Coronary Angiography;  Surgeon: [name], MD;  Location: [place] CV [facility];  Service: Cardiovascular;  Laterality: N/A;   CARDIAC CATHETERIZATION N/A 11/28/2015   Procedure: Coronary Balloon Angioplasty;  Surgeon: [name], MD;  Location: [place] [facility];  Service: Cardiovascular;  Laterality: N/A;  ostial LAD   CARDIAC CATHETERIZATION N/A 11/28/2015   Procedure: Coronary/Graft Angiography;  Surgeon: [name], MD;  Location: [place] [facility];  Service: Cardiovascular;  Laterality: N/A;  coronaries only    CARDIAC CATHETERIZATION N/A 04/21/2016    Procedure: Left Heart Cath and Coronary Angiography;  Surgeon: Wellington Hampshire, MD;  Location: [place] [facility];  Service: Cardiovascular;  Laterality: N/A;   CARDIAC CATHETERIZATION N/A 06/14/2016   Procedure: Left Heart Cath and Cors/Grafts Angiography;  Surgeon: Lorretta Harp, MD;  Location: [place] [facility];  Service: Cardiovascular;  Laterality: N/A;   CARDIAC CATHETERIZATION N/A 09/08/2016   Procedure: Left Heart Cath and Cors/Grafts Angiography;  Surgeon: Wellington Hampshire, MD;  Location: [place] [facility];  Service: Cardiovascular;  Laterality: N/A;   CARDIAC CATHETERIZATION     CORONARY ARTERY BYPASS GRAFT N/A 11/28/2015   Procedure: CORONARY ARTERY BYPASS GRAFTING (CABG) TIMES FIVE USING LEFT INTERNAL MAMMARY ARTERY AND RIGHT GREATER SAPHENOUS,VIEN HARVEATED BY ENDOVIEN, INTRAOPPRATIVE TEE;  Surgeon: Gaye Pollack, MD;  Location: [place];  Service: Open Heart Surgery;  Laterality: N/A;   CORONARY STENT INTERVENTION N/A 10/05/2017   Procedure: CORONARY STENT INTERVENTION;  Surgeon: [name], MD;  Location:  [place] IN[facility]IVE [facility];  Service: Cardiovascular;  Laterality: N/A;   ESOPHAGOGASTRODUODENOSCOPY (EGD) WITH PROPOFOL N/A 07/20/2019   Procedure: ESOPHAGOGASTRODUODENOSCOPY (EGD) WITH PROPOFOL;  Surgeon: Carol Ada, MD;  [facility];  Service: Endoscopy;  Laterality: N/A;   ESOPHAGOGASTRODUODENOSCOPY (EGD) WITH PROPOFOL N/A 01/30/2021   Procedure: ESOPHAGOGASTRODUODENOSCOPY (EGD) WITH PROPOFOL;  Surgeon: Carol Ada, MD;  [facility];  Service: Endoscopy;  Laterality: N/A;   FIDUCIAL MARKER PLACEMENT  12/24/2020   Procedure: FIDUCIAL DYE MARKER PLACEMENT;  Surgeon: Garner Nash, DO;  Location: [place] [facility];  Service: Pulmonary;;   HUMERUS SURGERY Right 1969   "tumor inside bone; filled it w/bone chips"   INTERCOSTAL NERVE BLOCK Right 12/24/2020   Procedure: INTERCOSTAL NERVE BLOCK;  Surgeon: [name], MD;  Location: [place];  Service:  Thoracic;  Laterality: Right;   IR IMAGING GUIDED PORT INSERTION  02/06/2021   LEFT HEART CATH AND CORS/GRAFTS ANGIOGRAPHY N/A 03/11/2017   Procedure: Left Heart Cath and Cors/Grafts Angiography;  Surgeon: Leonie Man, MD;  Location: [place] [facility];  Service: Cardiovascular;  Laterality: N/A;   LEFT HEART CATH AND CORS/GRAFTS ANGIOGRAPHY N/A 10/05/2017   Procedure: LEFT HEART CATH AND CORS/GRAFTS ANGIOGRAPHY;  Surgeon: [name], MD;  Location: [place] [facility];  Service: Cardiovascular;  Laterality: N/A;   LEFT HEART CATH AND CORS/GRAFTS ANGIOGRAPHY N/A 04/11/2019   Procedure: LEFT HEART CATH AND CORS/GRAFTS ANGIOGRAPHY;  Surgeon: [name], MD;  Location: [place] [facility];  Service: Cardiovascular;  Laterality: N/A;   NODE DISSECTION Right 12/24/2020   Procedure: NODE DISSECTION;  Surgeon: [name], MD;  Location: [place];  Service: Thoracic;  Laterality: Right;   PERIPHERAL VASCULAR CATHETERIZATION N/A 06/14/2016   Procedure: Lower Extremity Angiography;  Surgeon: Lorretta Harp, MD;  Location: [place] [facility];  Service: Cardiovascular;  Laterality: N/A;   VIDEO BRONCHOSCOPY WITH ENDOBRONCHIAL NAVIGATION Right 12/24/2020   Procedure: VIDEO BRONCHOSCOPY WITH ENDOBRONCHIAL NAVIGATION;  Surgeon: Garner Nash, DO;  Location: [place];  Service: Pulmonary;  Laterality: Right;   VIDEO BRONCHOSCOPY WITH ENDOBRONCHIAL ULTRASOUND N/A 12/24/2020   Procedure: VIDEO BRONCHOSCOPY WITH ENDOBRONCHIAL ULTRASOUND;  Surgeon: Garner Nash, DO;  Location: [place];  Service: Pulmonary;  Laterality: N/A;   VIDEO BRONCHOSCOPY WITH INSERTION OF INTERBRONCHIAL VALVE (IBV) N/A 12/31/2020   Procedure: VIDEO BRONCHOSCOPY WITH INSERTION OF INTERBRONCHIAL VALVE (IBV).VALVE IN CARTRIDGE 38mm,9mm. CHEST TUBE PLACEMENT.;  Surgeon: [name], MD;  [facility];  Service: Thoracic;  Laterality: N/A;     FAMILY HISTORY:  Family History  Problem Relation Age  of Onset   Lung cancer Mother    Heart Problems Father    Heart attack Father 73   Stroke Father    Heart failure Father    Heart attack Maternal Grandmother    Stroke Maternal Grandmother    Heart attack Paternal Uncle    Hypertension Brother    Autoimmune disease Neg Hx      SOCIAL HISTORY:  reports that he quit smoking about 5 years ago. His smoking use included cigarettes. He has a 33.00 pack-year smoking history. He has never used smokeless tobacco. He reports current alcohol use. He reports that he does not use drugs.  The patient is married and lives in [place].  He owns a [employer].   ALLERGIES: Prednisone, Tetanus toxoids, Wellbutrin [bupropion], Indomethacin, Other, and Varenicline   MEDICATIONS:  Current Outpatient Medications  Medication Sig Dispense Refill   acetaminophen (TYLENOL) 500 MG  tablet Take 1,000 mg by mouth every 6 (six) hours as needed for moderate pain.     amiodarone (PACERONE) 200 MG tablet Take 1 tablet (200 mg total) by mouth daily. 30 tablet 1   amLODipine (NORVASC) 10 MG tablet TAKE 1 TABLET(10 MG) BY MOUTH DAILY 90 tablet 1   atorvastatin (LIPITOR) 80 MG tablet TAKE 1 TABLET(80 MG) BY MOUTH DAILY 90 tablet 2   cloNIDine (CATAPRES) 0.1 MG tablet Take 0.1 mg by mouth daily as needed (sleep).     clopidogrel (PLAVIX) 75 MG tablet TAKE 1 TABLET BY MOUTH EVERY DAY (Patient taking differently: Take 75 mg by mouth daily.) 90 tablet 1   lidocaine-prilocaine (EMLA) cream Apply 1 application topically once as needed (port access).     lisinopril (ZESTRIL) 5 MG tablet Take 5 mg by mouth daily.     magnesium oxide (MAG-OX) 400 (240 Mg) MG tablet Take 1 tablet (400 mg total) by mouth daily. (Patient taking differently: Take 400 mg by mouth 2 (two) times daily.) 30 tablet 1   metoprolol succinate (TOPROL-XL) 25 MG 24 hr tablet Take 1 tablet (25 mg total) by mouth daily. 90 tablet 3   nitroGLYCERIN (NITROSTAT) 0.4 MG SL tablet PLACE 1 TABLET UNDER THE  TONGUE EVERY 5 MINUTES AS NEEDED FOR CHEST PAIN. 3 DOSES MAX 25 tablet 4   ondansetron (ZOFRAN) 8 MG tablet Take 1 tablet (8 mg total) by mouth every 8 (eight) hours as needed for nausea or vomiting. Starting 3 days after chemotherapy 30 tablet 2   oxyCODONE (OXY IR/ROXICODONE) 5 MG immediate release tablet Take 1 tablet (5 mg total) by mouth every 4 (four) hours as needed for moderate pain. 30 tablet 0   pantoprazole (PROTONIX) 40 MG tablet Take 1 tablet (40 mg total) by mouth daily. 14 tablet 0   prochlorperazine (COMPAZINE) 10 MG tablet Take 1 tablet (10 mg total) by mouth every 6 (six) hours as needed. 30 tablet 2   ranolazine (RANEXA) 1000 MG SR tablet Take 1 tablet (1,000 mg total) by mouth 2 (two) times daily. 180 tablet 0   RIVAROXABAN (XARELTO) VTE STARTER PACK (15 & 20 MG) Take one 15 mg tablet by mouth 2 (two) times daily. On day 22, switch to one 20 mg tablet once a day. Take with Food. 51 each 0   Tiotropium Bromide-Olodaterol (STIOLTO RESPIMAT) 2.5-2.5 MCG/ACT AERS Inhale 2 puffs into the lungs daily. 4 g 3   No current facility-administered medications for this encounter.     REVIEW OF SYSTEMS: On review of systems, the patient reports that he is doing pretty well.  He does verbalize to nursing some headaches, occasionally is nauseous. Some mornings he has changes in his vision that resolves in the day. He has also some shortness of breath at times with exertion and constipation. No other complaints are verbalized.     PHYSICAL EXAM:  Unable to assess given encounter type.   ECOG = 1  0 - Asymptomatic (Fully active, able to carry on all predisease activities without restriction)  1 - Symptomatic but completely ambulatory (Restricted in physically strenuous activity but ambulatory and able to carry out work of a light or sedentary nature. For example, light housework, office work)  2 - Symptomatic, <50% in bed during the day (Ambulatory and capable of all self care but unable  to carry out any work activities. Up and about more than 50% of waking hours)  3 - Symptomatic, >50% in bed, but not bedbound (Capable  of only limited self-care, confined to bed or chair 50% or more of waking hours)  4 - Bedbound (Completely disabled. Cannot carry on any self-care. Totally confined to bed or chair)  5 - Death   Eustace Pen MM, Creech RH, Tormey DC, et al. [phone]). "Toxicity and response criteria of the [place] [employer]". [place] Oncol. 5 (6): 649-55    LABORATORY DATA:  Lab Results  Component Value Date   WBC 17.8 (H) 05/11/2021   HGB 10.2 (L) 05/11/2021   HCT 31.4 (L) 05/11/2021   MCV 92.1 05/11/2021   PLT 365 05/11/2021   Lab Results  Component Value Date   NA 138 05/11/2021   K 4.3 05/11/2021   CL 102 05/11/2021   CO2 25 05/11/2021   Lab Results  Component Value Date   ALT 8 05/11/2021   AST 9 (L) 05/11/2021   ALKPHOS 123 05/11/2021   BILITOT 0.5 05/11/2021      RADIOGRAPHY: DG Chest 2 View  Result Date: 05/05/2021 CLINICAL DATA:  Dyspnea, nausea, vomiting EXAM: CHEST - 2 VIEW COMPARISON:  04/28/2021 FINDINGS: Small right pleural effusion is stable. Right-sided volume loss is unchanged left lung is clear. No pneumothorax. No pleural effusion on the left. Filter like foreign bodies are seen within the right hilum, unchanged from multiple prior examinations, possibly representing bronchial plugs. Coronary artery bypass grafting has been performed. Cardiac size within normal limits. Right internal jugular chest port is seen with its tip within the superior right atrium. No acute bone abnormality. IMPRESSION: Stable small right pleural effusion. Unchanged right-sided volume loss secondary to right upper lobectomy. Electronically Signed   By: Fidela Salisbury M.D.   On: 05/05/2021 00:23   CT Angio Chest PE W/Cm &/Or Wo Cm  Result Date: 04/27/2021 CLINICAL DATA:  Concern for pulmonary embolism. EXAM: CT ANGIOGRAPHY CHEST WITH CONTRAST TECHNIQUE:  Multidetector CT imaging of the chest was performed using the standard protocol during bolus administration of intravenous contrast. Multiplanar CT image reconstructions and MIPs were obtained to evaluate the vascular anatomy. CONTRAST:  119mL OMNIPAQUE IOHEXOL 350 MG/ML SOLN COMPARISON:  Chest CT dated 02/28/2021. FINDINGS: Cardiovascular: There is no cardiomegaly or pericardial effusion. 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 There is mild atherosclerotic calcification of the thoracic aorta. No aneurysmal dilatation or dissection. The origins of the great vessels of the aortic arch appear patent as visualized. Evaluation of the pulmonary arteries is limited due to respiratory motion artifact. There is linear nonocclusive thrombus within the lobar branches of the left lower lobe and lingula extending into the segmental pulmonary artery branches, age indeterminate. Correlation with history of recent PE recommended. Nonocclusive thrombus is also noted in the segmental branch of the right lower lobe (121/7). These findings however are new since the prior CT of 02/28/2021. Mediastinum/Nodes: No hilar or mediastinal adenopathy. The esophagus is grossly unremarkable. No mediastinal fluid collection. Lungs/Pleura: Similar appearance of small right pleural effusion with partial compressive atelectasis of the right lower lobe. Pneumonia is not excluded clinical correlation is recommended. Mild thickening of the bronchial wall of the right lower lobe. No focal consolidation or pneumothorax. The central airways remain patent. Postsurgical changes of the right lung as seen previously. Upper Abdomen: No acute abnormality. Musculoskeletal: Multilevel degenerative changes with anterior osteophyte/spurring. C7-T1 ACDF. Median sternotomy wires. No acute osseous pathology. Old healed right lateral rib fracture. Review of the MIP images confirms the above findings. IMPRESSION: 1. Nonocclusive pulmonary  emboli within the lobar branches of the left  lower lobe and lingula extending into the segmental pulmonary artery branches, age indeterminate, but new since the prior CT. Nonocclusive thrombus is also noted in the segmental branch of the right lower lobe. No CT evidence of right heart straining. 2. Similar appearance of small right pleural effusion with partial compressive atelectasis of the right lower lobe. Pneumonia is not excluded clinical correlation is recommended. 3. Aortic Atherosclerosis (ICD10-I70.0). These results were called by telephone at the time of interpretation on 04/27/2021 at 1:52 am to Dr. Florina Ou, who verbally acknowledged these results. Electronically Signed   By: Anner Crete M.D.   On: 04/27/2021 02:02   MR Brain W Wo Contrast  Result Date: 05/15/2021 CLINICAL DATA:  Metastatic disease follow-up EXAM: MRI HEAD WITHOUT AND WITH CONTRAST TECHNIQUE: Multiplanar, multiecho pulse sequences of the brain and surrounding structures were obtained without and with intravenous contrast. CONTRAST:  16mL MULTIHANCE GADOBENATE DIMEGLUMINE 529 MG/ML IV SOLN COMPARISON:  02/04/2021. FINDINGS: Brain: Previously noted enhancing lesion in the posterior left frontal lobe is no longer seen (series 13, image 140). Significant interval decrease in the size of the left cerebellar lesion, now measuring approximately 2 mm (series 13, image 60), previously 7 mm. Decreased vasogenic edema around the left cerebellar lesion. No new enhancing lesions. No acute infarct, cerebral edema, hemorrhage, mass effect, or midline shift. No hydrocephalus. Vascular: Normal flow voids. Skull and upper cervical spine: Normal marrow signal. Sinuses/Orbits: Negative. Other: None. IMPRESSION: 1. 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 2. Previously noted enhancing lesion in the left frontal lobe is no longer seen. 3. No new enhancing lesions to suggest additional metastatic  disease. Electronically Signed   By: Merilyn Baba M.D.   On: 05/15/2021 23:23   CT Abdomen Pelvis W Contrast  Result Date: 05/05/2021 CLINICAL DATA:  65 year old male with abdominal pain. Nausea. Epistaxis. History of lung cancer. EXAM: CT ABDOMEN AND PELVIS WITH CONTRAST TECHNIQUE: Multidetector CT imaging of the abdomen and pelvis was performed using the standard protocol following bolus administration of intravenous contrast. CONTRAST:  54mL OMNIPAQUE IOHEXOL 350 MG/ML SOLN COMPARISON:  CT Chest, Abdomen, and Pelvis 02/14/2021, and earlier FINDINGS: Lower chest: Small right lung base pleural effusion with thickened and enhancing pleura is mildly larger since June. No associated adjacent right lung base airspace disease. Left lung base is stable and negative. No pericardial effusion. Hepatobiliary: A tiny low-density area at the liver dome on series 2, image 12 is stable since at least 2020 and appears benign. Liver enhancement elsewhere remains normal. Negative gallbladder. No bile duct enlargement. Pancreas: Negative. Spleen: Negative, small splenules (normal variant). Adrenals/Urinary Tract: Normal adrenal glands. Nonobstructed kidneys enhance symmetrically. And there is symmetric renal contrast excretion on the delayed images. Chronic and benign left renal midpole cyst. Punctate right upper pole nephrolithiasis. Both ureters are decompressed and normal. Unremarkable bladder. Incidental pelvic phleboliths. Stomach/Bowel: Mild diverticulosis in the sigmoid colon. Otherwise negative large bowel. No active inflammation. Normal appendix on series 2, image 61. Oral contrast has not yet reached the terminal ileum. But there is no dilated small bowel. Opacified small bowel loops appear unremarkable. There is a small volume of residual oral contrast in the stomach and duodenum which appear normal. No free air or free fluid. Vascular/Lymphatic: Aortoiliac calcified atherosclerosis. Major arterial structures are  patent. Portal venous system is patent. Small abdominal lymph nodes are stable. No lymphadenopathy. Reproductive: Negative. Other: No pelvic free fluid. Musculoskeletal: Prior sternotomy. Chronic right lateral 8th rib fracture is  stable. Chronic left L5 pars fracture. No significant spondylolisthesis. No acute or suspicious osseous lesion identified. IMPRESSION: 1. No acute or inflammatory process identified in the abdomen or pelvis. Normal appendix. 2. A small right lung base complex pleural effusion with thickened pleura is mildly larger since June. 3. Aortic Atherosclerosis (ICD10-I70.0). Electronically Signed   By: Genevie Ann M.D.   On: 05/05/2021 04:08   DG Chest Port 1 View  Result Date: 04/28/2021 CLINICAL DATA:  Shortness of breath. EXAM: PORTABLE CHEST 1 VIEW COMPARISON:  April 26, 2021. FINDINGS: The heart size and mediastinal contours are within normal limits. Status post coronary artery bypass graft. Right internal jugular Port-A-Cath is unchanged in position left lung is clear. No pneumothorax is noted. Stable right pleural effusion is noted with associated atelectasis. The visualized skeletal structures are unremarkable. IMPRESSION: Stable mild right pleural effusion with associated right basilar atelectasis or scarring. Electronically Signed   By: Marijo Conception M.D.   On: 04/28/2021 18:33   DG Chest Port 1 View  Result Date: 04/26/2021 CLINICAL DATA:  Status post chemo last week for lung cancer. Presents with shortness of breath and rapid heart rate along with right-sided chest pain and dizziness. EXAM: PORTABLE CHEST 1 VIEW COMPARISON:  March 22, 2021 FINDINGS: There is stable right-sided venous Port-A-Cath positioning. Multiple sternal wires and vascular clips are noted. Mild atelectasis and/or scarring is seen within the right lung base. There is a small right pleural effusion. No pneumothorax is identified. The heart size and mediastinal contours are within normal limits. A radiopaque  fusion plate and screws are seen overlying the lower cervical spine. The visualized skeletal structures are otherwise unremarkable. IMPRESSION: Mild right basilar atelectasis and/or scarring with a small right pleural effusion. Electronically Signed   By: Virgina Norfolk M.D.   On: 04/26/2021 18:21        IMPRESSION/PLAN: 1. 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  The patient is doing very well with radiographic improvement in his primary disease as well as by MRI with his previously treated brain disease.  He has struggled significantly in the midst of chemotherapy with multiple hospitalizations and ER visits due to his symptoms.  He is glad to be completed with the systemic component of his treatment he will now be followed in surveillance with Dr. [name] and we will continue surveillance with the brain oncology program.  We plan for repeat MRI in 3 months time.  He is in agreement and will keep [name] informed of questions or concerns that arise prior to his next visit   Given current concerns for patient exposure during the COVID-19 pandemic, this encounter was conducted via telephone.  The patient has provided two factor identification and has given verbal consent for this type of encounter and has been advised to only accept a meeting of this type in a secure network environment. The time spent during this encounter was 45 minutes including preparation, discussion, and coordination of the patient's care. The attendants for this meeting include  Hayden Pedro  and [name].  During the encounter,   Hayden Pedro was located at [facility].  [name] was located at home.        Carola Rhine, [facility]   **Disclaimer: This note was dictated with voice recognition software. Similar sounding words can inadvertently be transcribed and this note may contain transcription  errors which may not have been  corrected upon publication of note.**

## 2021-05-20 ENCOUNTER — Other Ambulatory Visit: Payer: Self-pay | Admitting: Interventional Cardiology

## 2021-05-20 DIAGNOSIS — I2510 Atherosclerotic heart disease of native coronary artery without angina pectoris: Secondary | ICD-10-CM

## 2021-05-20 DIAGNOSIS — I255 Ischemic cardiomyopathy: Secondary | ICD-10-CM

## 2021-05-20 NOTE — Addendum Note (Signed)
Addended by: Glean Salen on: 05/20/2021 07:51 AM   Modules accepted: Orders

## 2021-05-21 ENCOUNTER — Other Ambulatory Visit: Payer: Self-pay

## 2021-05-21 ENCOUNTER — Telehealth: Payer: Self-pay | Admitting: Interventional Cardiology

## 2021-05-21 ENCOUNTER — Telehealth: Payer: Medicare Other | Admitting: Thoracic Surgery (Cardiothoracic Vascular Surgery)

## 2021-05-21 NOTE — Telephone Encounter (Signed)
Pt c/o medication issue:  1. Name of Medication: clopidogrel (PLAVIX) 75 MG tablet  lisinopril (ZESTRIL) 5 MG tablet  2. How are you currently taking this medication (dosage and times per day)? 1 table by mouth  3. Are you having a reaction (difficulty breathing--STAT)? no  4. What is your medication issue? Patient called in to say that they pharmacy denied his request for both medication. And he is unsure to why. Please advise

## 2021-05-21 NOTE — Telephone Encounter (Signed)
I spoke with [facility] and they have medications ready for patient to pick up.  Patient notified.

## 2021-05-21 NOTE — Telephone Encounter (Signed)
I spoke with patient who confirms he is taking lisinopril 5 mg daily

## 2021-05-22 ENCOUNTER — Telehealth: Payer: Medicare Other | Admitting: Thoracic Surgery (Cardiothoracic Vascular Surgery)

## 2021-05-24 ENCOUNTER — Other Ambulatory Visit: Payer: Self-pay | Admitting: Physician Assistant

## 2021-05-25 ENCOUNTER — Other Ambulatory Visit: Payer: Self-pay

## 2021-05-25 ENCOUNTER — Telehealth: Payer: Self-pay | Admitting: Medical Oncology

## 2021-05-25 MED ORDER — AMIODARONE HCL 200 MG PO TABS: 200.0000 mg | ORAL_TABLET | Freq: Every day | ORAL | 3 refills | Status: DC

## 2021-05-25 MED ORDER — RANOLAZINE ER 1000 MG PO TB12: 1000.0000 mg | ORAL_TABLET | Freq: Two times a day (BID) | ORAL | 3 refills | Status: DC

## 2021-05-25 NOTE — Telephone Encounter (Signed)
Refill sent to pharmacy.   

## 2021-05-25 NOTE — Telephone Encounter (Addendum)
headaches and nausea. Extra strength Tylenol helps headaches.  Vomits occasionally 1/2 to 1 cup. Takes Zofran periodically.  Pt does not feel good. Tired. His voice is strong.  Last tx 08/24.  1." Are these lasting side effects from the chemotherapy"?  2.Does this type of cancer come back?

## 2021-05-25 NOTE — Telephone Encounter (Signed)
Pt requesting refill on these medications. Please address.

## 2021-05-27 NOTE — Telephone Encounter (Signed)
I have spoken with [name] and advised as indicated. He expressed understanding of this information.

## 2021-05-29 ENCOUNTER — Other Ambulatory Visit: Payer: Self-pay

## 2021-05-29 ENCOUNTER — Ambulatory Visit (INDEPENDENT_AMBULATORY_CARE_PROVIDER_SITE_OTHER): Payer: Medicare Other | Admitting: Thoracic Surgery (Cardiothoracic Vascular Surgery)

## 2021-05-29 ENCOUNTER — Encounter: Payer: Self-pay | Admitting: Thoracic Surgery (Cardiothoracic Vascular Surgery)

## 2021-05-29 ENCOUNTER — Other Ambulatory Visit: Payer: Self-pay | Admitting: Radiation Therapy

## 2021-05-29 VITALS — BP 104/71 | HR 99 | Resp 20 | Ht 69.0 in | Wt 170.0 lb

## 2021-05-29 DIAGNOSIS — C7931 Secondary malignant neoplasm of brain: Secondary | ICD-10-CM

## 2021-05-29 DIAGNOSIS — Z09 Encounter for follow-up examination after completed treatment for conditions other than malignant neoplasm: Secondary | ICD-10-CM | POA: Diagnosis not present

## 2021-05-29 NOTE — Progress Notes (Signed)
     [address]       [place] [address]             [phone]        [name] comes in to discuss removal endobronchial valves that were placed during his hospitalization after his lobectomy.  This was Dec 31, 2020.  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  He has had a difficult time with chemotherapy, and recently was diagnosed with a pulmonary embolism.  He is currently on Eliquis and is scheduled to be on this for another 5 months.  Regards to his respiratory status he occasionally gets short of breath but for the most part this does not bother him.  He is hesitant to proceed with endobronchial valve removal while he is on Eliquis.  He would like to wait the entire 82months prior to scheduling this next procedure.  I will see him back in 56months to discuss plans for removal.

## 2021-06-04 ENCOUNTER — Encounter (HOSPITAL_BASED_OUTPATIENT_CLINIC_OR_DEPARTMENT_OTHER): Payer: Self-pay | Admitting: Emergency Medicine

## 2021-06-04 ENCOUNTER — Emergency Department (HOSPITAL_BASED_OUTPATIENT_CLINIC_OR_DEPARTMENT_OTHER): Payer: Medicare Other

## 2021-06-04 ENCOUNTER — Emergency Department (HOSPITAL_BASED_OUTPATIENT_CLINIC_OR_DEPARTMENT_OTHER)
Admission: EM | Admit: 2021-06-04 | Discharge: 2021-06-04 | Disposition: A | Discharge status: Home / Self Care | Payer: Medicare Other | Source: Home / Self Care | Attending: Emergency Medicine | Admitting: Emergency Medicine

## 2021-06-04 ENCOUNTER — Other Ambulatory Visit (HOSPITAL_BASED_OUTPATIENT_CLINIC_OR_DEPARTMENT_OTHER): Payer: Self-pay

## 2021-06-04 ENCOUNTER — Encounter: Payer: Self-pay | Admitting: Physician Assistant

## 2021-06-04 ENCOUNTER — Encounter: Payer: Self-pay | Admitting: Internal Medicine

## 2021-06-04 ENCOUNTER — Other Ambulatory Visit: Payer: Self-pay

## 2021-06-04 DIAGNOSIS — Z85828 Personal history of other malignant neoplasm of skin: Secondary | ICD-10-CM | POA: Insufficient documentation

## 2021-06-04 DIAGNOSIS — Z7901 Long term (current) use of anticoagulants: Secondary | ICD-10-CM | POA: Insufficient documentation

## 2021-06-04 DIAGNOSIS — I25118 Atherosclerotic heart disease of native coronary artery with other forms of angina pectoris: Secondary | ICD-10-CM | POA: Insufficient documentation

## 2021-06-04 DIAGNOSIS — Z79899 Other long term (current) drug therapy: Secondary | ICD-10-CM | POA: Insufficient documentation

## 2021-06-04 DIAGNOSIS — R059 Cough, unspecified: Secondary | ICD-10-CM | POA: Insufficient documentation

## 2021-06-04 DIAGNOSIS — Z7902 Long term (current) use of antithrombotics/antiplatelets: Secondary | ICD-10-CM | POA: Insufficient documentation

## 2021-06-04 DIAGNOSIS — Z85841 Personal history of malignant neoplasm of brain: Secondary | ICD-10-CM | POA: Insufficient documentation

## 2021-06-04 DIAGNOSIS — J90 Pleural effusion, not elsewhere classified: Secondary | ICD-10-CM

## 2021-06-04 DIAGNOSIS — I480 Paroxysmal atrial fibrillation: Secondary | ICD-10-CM | POA: Insufficient documentation

## 2021-06-04 DIAGNOSIS — N183 Chronic kidney disease, stage 3 unspecified: Secondary | ICD-10-CM | POA: Insufficient documentation

## 2021-06-04 DIAGNOSIS — Z85118 Personal history of other malignant neoplasm of bronchus and lung: Secondary | ICD-10-CM | POA: Insufficient documentation

## 2021-06-04 DIAGNOSIS — R042 Hemoptysis: Secondary | ICD-10-CM | POA: Diagnosis not present

## 2021-06-04 DIAGNOSIS — Z87891 Personal history of nicotine dependence: Secondary | ICD-10-CM | POA: Insufficient documentation

## 2021-06-04 DIAGNOSIS — J918 Pleural effusion in other conditions classified elsewhere: Secondary | ICD-10-CM | POA: Insufficient documentation

## 2021-06-04 DIAGNOSIS — Z951 Presence of aortocoronary bypass graft: Secondary | ICD-10-CM | POA: Insufficient documentation

## 2021-06-04 DIAGNOSIS — R079 Chest pain, unspecified: Secondary | ICD-10-CM

## 2021-06-04 DIAGNOSIS — J9811 Atelectasis: Secondary | ICD-10-CM | POA: Insufficient documentation

## 2021-06-04 DIAGNOSIS — R051 Acute cough: Secondary | ICD-10-CM

## 2021-06-04 DIAGNOSIS — I129 Hypertensive chronic kidney disease with stage 1 through stage 4 chronic kidney disease, or unspecified chronic kidney disease: Secondary | ICD-10-CM | POA: Insufficient documentation

## 2021-06-04 LAB — COMPREHENSIVE METABOLIC PANEL
ALT: 18 U/L (ref 0–44)
AST: 18 U/L (ref 15–41)
Albumin: 3.8 g/dL (ref 3.5–5.0)
Alkaline Phosphatase: 92 U/L (ref 38–126)
Anion gap: 9 (ref 5–15)
BUN: 20 mg/dL (ref 8–23)
CO2: 21 mmol/L — ABNORMAL LOW (ref 22–32)
Calcium: 9.2 mg/dL (ref 8.9–10.3)
Chloride: 107 mmol/L (ref 98–111)
Creatinine, Ser: 1.04 mg/dL (ref 0.61–1.24)
GFR, Estimated: >60 mL/min (ref >60)
Glucose, Bld: 96 mg/dL (ref 70–99)
Potassium: 3.6 mmol/L (ref 3.5–5.1)
Sodium: 137 mmol/L (ref 135–145)
Total Bilirubin: 0.4 mg/dL (ref 0.3–1.2)
Total Protein: 6.9 g/dL (ref 6.5–8.1)

## 2021-06-04 LAB — TROPONIN I (HIGH SENSITIVITY)
Troponin I (High Sensitivity): 3 ng/L (ref <18)
Troponin I (High Sensitivity): 5 ng/L (ref <18)

## 2021-06-04 LAB — CBC WITH DIFFERENTIAL/PLATELET
Abs Immature Granulocytes: 0.02 10*3/uL (ref 0.00–0.07)
Basophils Absolute: 0 10*3/uL (ref 0.0–0.1)
Basophils Relative: 1 %
Eosinophils Absolute: 0.2 10*3/uL (ref 0.0–0.5)
Eosinophils Relative: 2 %
HCT: 33.9 % — ABNORMAL LOW (ref 39.0–52.0)
Hemoglobin: 11.1 g/dL — ABNORMAL LOW (ref 13.0–17.0)
Immature Granulocytes: 0 %
Lymphocytes Relative: 14 %
Lymphs Abs: 1.2 10*3/uL (ref 0.7–4.0)
MCH: 31.1 pg (ref 26.0–34.0)
MCHC: 32.7 g/dL (ref 30.0–36.0)
MCV: 95 fL (ref 80.0–100.0)
Monocytes Absolute: 0.5 10*3/uL (ref 0.1–1.0)
Monocytes Relative: 6 %
Neutro Abs: 6.5 10*3/uL (ref 1.7–7.7)
Neutrophils Relative %: 77 %
Platelets: 188 10*3/uL (ref 150–400)
RBC: 3.57 MIL/uL — ABNORMAL LOW (ref 4.22–5.81)
RDW: 15.4 % (ref 11.5–15.5)
WBC: 8.5 10*3/uL (ref 4.0–10.5)
nRBC: 0 % (ref 0.0–0.2)

## 2021-06-04 LAB — PROTIME-INR
INR: 1.3 — ABNORMAL HIGH (ref 0.8–1.2)
Prothrombin Time: 16.5 seconds — ABNORMAL HIGH (ref 11.4–15.2)

## 2021-06-04 LAB — BRAIN NATRIURETIC PEPTIDE: B Natriuretic Peptide: 53 pg/mL (ref 0.0–100.0)

## 2021-06-04 MED ORDER — DM-GUAIFENESIN ER 30-600 MG PO TB12
1.0000 | ORAL_TABLET | Freq: Two times a day (BID) | ORAL | 0 refills | Status: DC
  Filled 2021-06-04: qty 20, 10d supply, fill #0

## 2021-06-04 MED ORDER — HYDROMORPHONE HCL 1 MG/ML IJ SOLN
1.0000 mg | Freq: Once | INTRAMUSCULAR | Status: AC
  Administered 2021-06-04: 1 mg via INTRAVENOUS
  Filled 2021-06-04: qty 1

## 2021-06-04 MED ORDER — SODIUM CHLORIDE 0.9 % IV BOLUS
500.0000 mL | Freq: Once | INTRAVENOUS | Status: AC
  Administered 2021-06-04: 500 mL via INTRAVENOUS

## 2021-06-04 MED ORDER — IOHEXOL 350 MG/ML SOLN
100.0000 mL | Freq: Once | INTRAVENOUS | Status: AC | PRN
  Administered 2021-06-04: 100 mL via INTRAVENOUS

## 2021-06-04 MED ORDER — HEPARIN SOD (PORK) LOCK FLUSH 100 UNIT/ML IV SOLN
500.0000 [IU] | Freq: Once | INTRAVENOUS | Status: AC
  Administered 2021-06-04: 500 [IU]
  Filled 2021-06-04: qty 5

## 2021-06-04 MED ORDER — ONDANSETRON HCL 4 MG/2ML IJ SOLN
4.0000 mg | Freq: Once | INTRAMUSCULAR | Status: AC
  Administered 2021-06-04: 4 mg via INTRAVENOUS
  Filled 2021-06-04: qty 2

## 2021-06-04 MED ORDER — OXYCODONE HCL 5 MG PO TABS
5.0000 mg | ORAL_TABLET | Freq: Four times a day (QID) | ORAL | 0 refills | Status: DC | PRN
  Filled 2021-06-04: qty 20, 5d supply, fill #0

## 2021-06-04 MED ORDER — MORPHINE SULFATE (PF) 4 MG/ML IV SOLN
4.0000 mg | Freq: Once | INTRAVENOUS | Status: AC
  Administered 2021-06-04: 4 mg via INTRAVENOUS
  Filled 2021-06-04: qty 1

## 2021-06-04 NOTE — ED Notes (Signed)
Patient transported to CT 

## 2021-06-04 NOTE — Discharge Instructions (Signed)
You have been seen and discharged from the emergency department.  You were found to have a pleural effusion on the right which has been present before.  This is most likely the cause of your chest pain and cough. Your blood work and cardiac workup was otherwise normal. Take cough medicine and pain medicine as prescribed.  Do not mix this medication with alcohol or other sedating medications. Do not drive or do heavy physical activity and to know how this medication affects you.  It may cause drowsiness.  Follow-up with your primary provider for reevaluation and further care. Take home medications as prescribed. If you have any worsening symptoms or further concerns for your health please return to an emergency department for further evaluation.

## 2021-06-04 NOTE — ED Notes (Signed)
Message to [place] that patient is requesting cough medication

## 2021-06-04 NOTE — ED Triage Notes (Signed)
Pt arrives pov with c/o coughing up "speckles of blood". Pt endorses cough x 1 week. Pt endorses rib pain as well. Pt reports diarrhea x 3 days. Pt denies fever, cancer patient.

## 2021-06-04 NOTE — ED Provider Notes (Signed)
[place] EMERGENCY DEPARTMENT Provider Note   CSN: [id number] Arrival date & time: 06/04/21  6144     History Chief Complaint  Patient presents with   Shortness of Breath   Hemoptysis    [name] is a 65 y.o. male.  HPI  65 year old male with past medical history of lung cancer with metastasis undergoing chemotherapy, chronic bilateral PEs anticoagulated on Xarelto, CAD status post CABG, HTN, HLD presents the emergency department with ongoing cough and reported hemoptysis.  Patient states he has had a cough for the past week.  He has now developed lower rib pain secondary to the cough.  Earlier this morning he coughed so hard that it caused him to throw up mucus and there was blood speckles in it.  He called his doctor who referred him to the ER for further evaluation.  Besides the rib pain he denies any chest pain or back pain.  He does feel short of breath and weak.  Also reports intermittent diarrhea for the past 3 days, nonbloody.  No fever.  No leg swelling.  Has been compliant with his Xarelto and other medications.  Past Medical History:  Diagnosis Date   Anxiety    Arthritis    Basal cell carcinoma (BCC) of forehead    CAD (coronary artery disease)    a. 10/2015 ant STEMI >> LHC with 3 v CAD; oLAD tx with POBA >> emergent CABG. b. Multiple evals since that time, early graft failure of SVG-RCA by cath 03/2016. c. 2/19 PCI/DES x1 to pRCA, normal EF.   Carotid artery disease ([place])    a. 40-59% BICA 02/2018.   Depression    Dyspnea    Ectopic atrial tachycardia (HCC)    Esophageal reflux    eosinophil esophagitis   Family history of adverse reaction to anesthesia    "sister has PONV" (06/21/2017)   Former tobacco use    Gout    Hepatitis C    "treated and cured" (06/21/2017)   High cholesterol    History of kidney stones    Hypertension    Ischemic cardiomyopathy    a. EF 25-30% at intraop TEE 4/17  //  b. Limited Echo 5/17 - EF 45-50%, mild ant HK.  c. EF 55-65% by cath 09/2017.   Migraine    "3-4/yr" (06/21/2017)   Myocardial infarction ([place]) 10/2015   Palpitations    Sinus bradycardia    a. HR dropping into 40s in 02/2016 -> BB reduced.   Stroke [facility]) 10/2016   "small one; sometimes my memory/cognitive issues" (06/21/2017)   Symptomatic hypotension    a. 02/2016 ER visit -> meds reduced.   Syncope    Wears dentures    Wears glasses     Patient Active Problem List   Diagnosis Date Noted   History of pulmonary embolus (PE) 04/29/2021   Anemia 04/29/2021   Hypotension due to hypovolemia 04/28/2021   Hypomagnesemia 04/08/2021   Port-A-Cath in place 04/07/2021   Protein-calorie malnutrition, severe 03/25/2021   CAP (community acquired pneumonia) 03/23/2021   PNA (pneumonia) 03/22/2021   Community acquired pneumonia 03/22/2021   Sepsis ([place]) 03/22/2021   CKD (chronic kidney disease), stage III ([place]) 03/22/2021   Hypoxia 02/28/2021   Dyspnea 02/28/2021   AKI (acute kidney injury) ([place]) 02/25/2021   AF (paroxysmal atrial fibrillation) ([place]) 02/25/2021   Pleural effusion 02/25/2021   Neutropenia, drug-induced ([place]) 02/24/2021   Positive blood cultures 02/17/2021   Positive blood culture 02/16/2021   Right  flank pain 02/09/2021   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[place]) 01/30/2021   Encounter for antineoplastic chemotherapy 01/29/2021   Goals of care, counseling/discussion 01/14/2021   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[place]) 01/14/2021   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   Acute back pain with sciatica 01/12/2021   Allergic rhinitis 01/12/2021   Amnesia 01/12/2021   Kidney stone 01/12/2021   Sciatica 01/12/2021   Bipolar disorder ([place]) 01/12/2021   S/P lobectomy of lung 12/24/2020   Solitary lung nodule 11/28/2020   Chest pain 11/07/2019   Gastroesophageal reflux disease    Cervical radiculopathy 10/17/2018   Preoperative clearance 10/04/2018   Palpitations    Coronary artery  disease involving coronary bypass graft of native heart with angina pectoris ([place])    Transient loss of consciousness 03/24/2018   Ectopic atrial tachycardia ([place]) 02/09/2018   Central chest pain 03/10/2017   Family hx-stroke 11/10/2016   Stroke-like episode ([place]) - R brain, s/p tPA 11/09/2016   Unstable angina ([place]) 09/07/2016   Claudication of both lower extremities ([place])    Pure hypercholesterolemia    Tobacco abuse disorder    CAD of autologous artery bypass graft without angina    Chest pain at rest 06/10/2016   Abnormal nuclear stress test - HIGH RISK 04/20/2016   Old MI (myocardial infarction)    Essential hypertension 02/26/2016   Ischemic cardiomyopathy 12/25/2015   Hyperlipidemia LDL goal <70 12/25/2015   Mild tobacco abuse in early remission 11/28/2015   Coronary artery disease involving native coronary artery of native heart with angina pectoris ([place]) 11/28/2015   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   Acute MI anterior wall first episode care [facility])    Precordial chest pain 03/07/2015   Gout attack 03/07/2015   Mixed bipolar I disorder ([place]) 03/07/2015   Fibromyalgia 07/09/2014   Gout 07/09/2014   Anxiety 07/09/2014   Depression 07/09/2014   Hepatitis C 82/95/6213   Eosinophilic esophagitis 08/65/7846    Past Surgical History:  Procedure Laterality Date   25 GAUGE PARS PLANA VITRECTOMY WITH 20 GAUGE MVR PORT  12/31/2020   Procedure: 25 GAUGE PARS PLANA VITRECTOMY WITH 20 GAUGE MVR PORT;  Surgeon: [name], MD;  Location: [facility] OR;  Service: Thoracic;;   ANTERIOR CERVICAL DECOMP/DISCECTOMY FUSION N/A 10/17/2018   Procedure: Anterior Cervical Decompression Fusion - Cervical seven -Thoracic one;  Surgeon: Consuella Lose, MD;  Location: [place];  Service: Neurosurgery;  Laterality: N/A;   BASAL CELL CARCINOMA EXCISION     "forehead   BIOPSY  07/20/2019   Procedure: BIOPSY;  Surgeon: Carol Ada, MD;  Location: [facility];  Service: Endoscopy;;   BRONCHIAL BIOPSY   12/24/2020   Procedure: BRONCHIAL BIOPSIES;  Surgeon: Garner Nash, DO;  Location: [place] ENDOSCOPY;  Service: Pulmonary;;   BRONCHIAL BRUSHINGS  12/24/2020   Procedure: BRONCHIAL BRUSHINGS;  Surgeon: Garner Nash, DO;  Location: [place];  Service: Pulmonary;;   BRONCHIAL NEEDLE ASPIRATION BIOPSY  12/24/2020   Procedure: BRONCHIAL NEEDLE ASPIRATION BIOPSIES;  Surgeon: Garner Nash, DO;  Location: [place];  Service: Pulmonary;;   CARDIAC CATHETERIZATION N/A 11/28/2015   Procedure: Left Heart Cath and Coronary Angiography;  Surgeon: [name], MD;  Location: [place] CV LAB;  Service: Cardiovascular;  Laterality: N/A;   CARDIAC CATHETERIZATION N/A 11/28/2015   Procedure: Coronary Balloon Angioplasty;  Surgeon: [name], MD;  Location: [place] CV LAB;  Service: Cardiovascular;  Laterality: N/A;  ostial LAD   CARDIAC CATHETERIZATION N/A  11/28/2015   Procedure: Coronary/Graft Angiography;  Surgeon: [name], MD;  Location: [place] CV LAB;  Service: Cardiovascular;  Laterality: N/A;  coronaries only    CARDIAC CATHETERIZATION N/A 04/21/2016   Procedure: Left Heart Cath and Coronary Angiography;  Surgeon: Wellington [place], MD;  Location: [place] CV LAB;  Service: Cardiovascular;  Laterality: N/A;   CARDIAC CATHETERIZATION N/A 06/14/2016   Procedure: Left Heart Cath and Cors/Grafts Angiography;  Surgeon: Lorretta Harp, MD;  Location: [place] CV LAB;  Service: Cardiovascular;  Laterality: N/A;   CARDIAC CATHETERIZATION N/A 09/08/2016   Procedure: Left Heart Cath and Cors/Grafts Angiography;  Surgeon: Wellington Hampshire, MD;  Location: [place] CV LAB;  Service: Cardiovascular;  Laterality: N/A;   CARDIAC CATHETERIZATION     CORONARY ARTERY BYPASS GRAFT N/A 11/28/2015   Procedure: CORONARY ARTERY BYPASS GRAFTING (CABG) TIMES FIVE USING LEFT INTERNAL MAMMARY ARTERY AND RIGHT GREATER SAPHENOUS,VIEN HARVEATED BY ENDOVIEN, INTRAOPPRATIVE TEE;   Surgeon: Gaye Pollack, MD;  Location: [place];  Service: Open Heart Surgery;  Laterality: N/A;   CORONARY STENT INTERVENTION N/A 10/05/2017   Procedure: CORONARY STENT INTERVENTION;  Surgeon: [name], MD;  Location: [place] CV LAB;  Service: Cardiovascular;  Laterality: N/A;   ESOPHAGOGASTRODUODENOSCOPY (EGD) WITH PROPOFOL N/A 07/20/2019   Procedure: ESOPHAGOGASTRODUODENOSCOPY (EGD) WITH PROPOFOL;  Surgeon: Carol Ada, MD;  [facility];  Service: Endoscopy;  Laterality: N/A;   ESOPHAGOGASTRODUODENOSCOPY (EGD) WITH PROPOFOL N/A 01/30/2021   Procedure: ESOPHAGOGASTRODUODENOSCOPY (EGD) WITH PROPOFOL;  Surgeon: Carol Ada, MD;  [facility];  Service: Endoscopy;  Laterality: N/A;   FIDUCIAL MARKER PLACEMENT  12/24/2020   Procedure: FIDUCIAL DYE MARKER PLACEMENT;  Surgeon: Garner Nash, DO;  Location: [place] [facility]COPY;  Service: Pulmonary;;   HUMERUS SURGERY Right 1969   "tumor inside bone; filled it w/bone chips"   INTERCOSTAL NERVE BLOCK Right 12/24/2020   Procedure: INTERCOSTAL NERVE BLOCK;  Surgeon: [name], MD;  Location: [place];  Service: Thoracic;  Laterality: Right;   IR IMAGING GUIDED PORT INSERTION  02/06/2021   LEFT HEART CATH AND CORS/GRAFTS ANGIOGRAPHY N/A 03/11/2017   Procedure: Left Heart Cath and Cors/Grafts Angiography;  Surgeon: Leonie Man, MD;  Location: [place] CV LAB;  Service: Cardiovascular;  Laterality: N/A;   LEFT HEART CATH AND CORS/GRAFTS ANGIOGRAPHY N/A 10/05/2017   Procedure: LEFT HEART CATH AND CORS/GRAFTS ANGIOGRAPHY;  Surgeon: [name], MD;  Location: [place] CV LAB;  Service: Cardiovascular;  Laterality: N/A;   LEFT HEART CATH AND CORS/GRAFTS ANGIOGRAPHY N/A 04/11/2019   Procedure: LEFT HEART CATH AND CORS/GRAFTS ANGIOGRAPHY;  Surgeon: [name], MD;  Location: [place] CV LAB;  Service: Cardiovascular;  Laterality: N/A;   NODE DISSECTION Right 12/24/2020   Procedure: NODE DISSECTION;   Surgeon: [name], MD;  Location: [place];  Service: Thoracic;  Laterality: Right;   PERIPHERAL VASCULAR CATHETERIZATION N/A 06/14/2016   Procedure: Lower Extremity Angiography;  Surgeon: Lorretta Harp, MD;  Location: [place] CV LAB;  Service: Cardiovascular;  Laterality: N/A;   VIDEO BRONCHOSCOPY WITH ENDOBRONCHIAL NAVIGATION Right 12/24/2020   Procedure: VIDEO BRONCHOSCOPY WITH ENDOBRONCHIAL NAVIGATION;  Surgeon: Garner Nash, DO;  Location: [place];  Service: Pulmonary;  Laterality: Right;   VIDEO BRONCHOSCOPY WITH ENDOBRONCHIAL ULTRASOUND N/A 12/24/2020   Procedure: VIDEO BRONCHOSCOPY WITH ENDOBRONCHIAL ULTRASOUND;  Surgeon: Garner Nash, DO;  Location: [place];  Service: Pulmonary;  Laterality: N/A;   VIDEO BRONCHOSCOPY WITH INSERTION OF INTERBRONCHIAL VALVE (  IBV) N/A 12/31/2020   Procedure: VIDEO BRONCHOSCOPY WITH INSERTION OF INTERBRONCHIAL VALVE (IBV).VALVE IN CARTRIDGE 21mm,9mm. CHEST TUBE PLACEMENT.;  Surgeon: [name], MD;  [facility];  Service: Thoracic;  Laterality: N/A;       Family History  Problem Relation Age of Onset   Lung cancer Mother    Heart Problems Father    Heart attack Father 65   Stroke Father    Heart failure Father    Heart attack Maternal Grandmother    Stroke Maternal Grandmother    Heart attack Paternal Uncle    Hypertension Brother    Autoimmune disease Neg Hx     Social History   Tobacco Use   Smoking status: Former    Packs/day: 0.75    Years: 44.00    Pack years: 33.00    Types: Cigarettes    Quit date: 11/28/2015    Years since quitting: 5.5   Smokeless tobacco: Never  Vaping Use   Vaping Use: Never used  Substance Use Topics   Alcohol use: Yes    Comment: occ   Drug use: No    Comment: 06/21/2017 "nothing since the 1980s"    Home Medications Prior to Admission medications   Medication Sig Start Date End Date Taking? Authorizing Provider  acetaminophen (TYLENOL) 500 MG tablet Take  1,000 mg by mouth every 6 (six) hours as needed for moderate pain.    [provider]  amiodarone (PACERONE) 200 MG tablet Take 1 tablet (200 mg total) by mouth daily. 05/25/21   [name], MD  amLODipine (NORVASC) 10 MG tablet TAKE 1 TABLET(10 MG) BY MOUTH DAILY 12/02/20   [name], MD  atorvastatin (LIPITOR) 80 MG tablet TAKE 1 TABLET(80 MG) BY MOUTH DAILY 04/01/21   [name], MD  cloNIDine (CATAPRES) 0.1 MG tablet Take 0.1 mg by mouth daily as needed (sleep). 03/12/21   [provider]  clopidogrel (PLAVIX) 75 MG tablet TAKE 1 TABLET BY MOUTH EVERY DAY 05/21/21   [name], MD  lidocaine-prilocaine (EMLA) cream Apply 1 application topically once as needed (port access). 02/27/21   Aline August, MD  lisinopril (ZESTRIL) 5 MG tablet Take 1 tablet (5 mg total) by mouth daily. 05/21/21   [name], MD  magnesium oxide (MAG-OX) 400 (240 Mg) MG tablet Take 1 tablet (400 mg total) by mouth daily. 04/07/21   Heilingoetter, Cassandra L, PA-C  metoprolol succinate (TOPROL-XL) 25 MG 24 hr tablet Take 1 tablet (25 mg total) by mouth daily. 08/27/20   [name], MD  nitroGLYCERIN (NITROSTAT) 0.4 MG SL tablet PLACE 1 TABLET UNDER THE TONGUE EVERY 5 MINUTES AS NEEDED FOR CHEST PAIN. 3 DOSES MAX 04/16/20   [name], MD  ondansetron (ZOFRAN) 8 MG tablet Take 1 tablet (8 mg total) by mouth every 8 (eight) hours as needed for nausea or vomiting. Starting 3 days after chemotherapy 04/01/21   Heilingoetter, Cassandra L, PA-C  oxyCODONE (OXY IR/ROXICODONE) 5 MG immediate release tablet Take 1 tablet (5 mg total) by mouth every 4 (four) hours as needed for moderate pain. 04/27/21   Molpus, John, MD  pantoprazole (PROTONIX) 40 MG tablet Take 1 tablet (40 mg total) by mouth daily. 05/05/21   Quintella Reichert, MD  prochlorperazine (COMPAZINE) 10 MG tablet Take 1 tablet (10 mg total) by mouth every 6 (six) hours as needed. 01/14/21    Heilingoetter, Cassandra L, PA-C  ranolazine (RANEXA) 1000 MG SR tablet Take 1 tablet (  1,000 mg total) by mouth 2 (two) times daily. 05/25/21   [name], MD  RIVAROXABAN Alveda Reasons) VTE STARTER PACK (15 & 20 MG) Take one 15 mg tablet by mouth 2 (two) times daily. On day 22, switch to one 20 mg tablet once a day. Take with Food. 04/27/21     Tiotropium Bromide-Olodaterol (STIOLTO RESPIMAT) 2.5-2.5 MCG/ACT AERS Inhale 2 puffs into the lungs daily. 01/12/21   Garner Nash, DO    Allergies    Prednisone, Tetanus toxoids, Wellbutrin [bupropion], Indomethacin, Other, and Varenicline  Review of Systems   Review of Systems  Constitutional:  Positive for fatigue. Negative for chills and fever.  HENT:  Negative for congestion.   Eyes:  Negative for visual disturbance.  Respiratory:  Positive for cough. Negative for shortness of breath.   Cardiovascular:  Negative for chest pain, palpitations and leg swelling.  Gastrointestinal:  Positive for nausea. Negative for abdominal pain, diarrhea and vomiting.  Genitourinary:  Negative for dysuria.  Skin:  Negative for rash.  Neurological:  Negative for headaches.   Physical Exam Updated Vital Signs BP 125/84   Pulse 91   Temp 98.6 F (37 C) (Oral)   Resp (!) 25   Ht 5\' 9"  (1.753 m)   Wt 75.8 kg   SpO2 98%   BMI 24.66 kg/m   Physical Exam Vitals and nursing note reviewed.  Constitutional:      Appearance: Normal appearance. He is not diaphoretic.  HENT:     Head: Normocephalic.     Mouth/Throat:     Mouth: Mucous membranes are moist.  Cardiovascular:     Rate and Rhythm: Normal rate.  Pulmonary:     Effort: Pulmonary effort is normal. No respiratory distress.     Breath sounds: Examination of the right-lower field reveals decreased breath sounds. Examination of the left-lower field reveals decreased breath sounds. Decreased breath sounds present. No wheezing.  Abdominal:     Palpations: Abdomen is soft.     Tenderness: There  is no abdominal tenderness.  Musculoskeletal:     Right lower leg: No edema.     Left lower leg: No edema.  Skin:    General: Skin is warm.  Neurological:     Mental Status: He is alert and oriented to person, place, and time. Mental status is at baseline.  Psychiatric:        Mood and Affect: Mood normal.    ED Results / Procedures / Treatments   Labs (all labs ordered are listed, but only abnormal results are displayed) Labs Reviewed  CBC WITH DIFFERENTIAL/PLATELET  COMPREHENSIVE METABOLIC PANEL  PROTIME-INR  BRAIN NATRIURETIC PEPTIDE  TROPONIN I (HIGH SENSITIVITY)    EKG None  Radiology No results found.  Procedures Procedures   Medications Ordered in ED Medications  sodium chloride 0.9 % bolus 500 mL (has no administration in time range)  ondansetron (ZOFRAN) injection 4 mg (has no administration in time range)  morphine 4 MG/ML injection 4 mg (has no administration in time range)    ED Course  I have reviewed the triage vital signs and the nursing notes.  Pertinent labs & imaging results that were available during my care of the patient were reviewed by me and considered in my medical decision making (see chart for details).    MDM Rules/Calculators/A&P                           65 year old male with  past medical history of lung cancer in remission presents emergency department with ongoing chest pain and cough.  Today he had blood specks in his cough which is what prompted him to call his oncologist who referred him here for evaluation.  Vitals are stable on arrival.  No further hemoptysis.  Chest x-ray shows stable pleural effusion with atelectasis.  With a history of previous PEs and lung malignancy CT was done.  CT shows no pulmonary embolus.  Identifies the stable pleural effusion with mild atelectasis.  No other acute finding.  Cardiac work-up is negative, troponins are negative, BNP is normal.  No findings of heart failure.  After medication here patient  feels improved.  Symptoms could be coming from pleural effusion.  Plan to treat him symptomatically and have him follow-up as an outpatient.  Patient at this time appears safe and stable for discharge and will be treated as an outpatient.  Discharge plan and strict return to ED precautions discussed, patient verbalizes understanding and agreement.  Final Clinical Impression(s) / ED Diagnoses Final diagnoses:  None    Rx / DC Orders ED Discharge Orders     None        Lorelle Gibbs, DO 06/04/21 1409

## 2021-06-04 NOTE — ED Notes (Signed)
[place] unchanged, [place] updated

## 2021-06-04 NOTE — ED Notes (Signed)
Pt returned from CT °

## 2021-06-04 NOTE — ED Notes (Signed)
D/c paperwork reviewed with pt, including prescriptions and f/u care. Pt verbalized understanding. All questions answered prior to d/c. Pt ambulatory to ED exit to meet ride.

## 2021-06-05 ENCOUNTER — Telehealth: Payer: Self-pay

## 2021-06-05 NOTE — Telephone Encounter (Signed)
Returned patient's call regarding flush and covid booster appointment on Monday, 10/10. Patient requesting to cancel appointment as he had recent ED visit in which his port was accessed and flushed. In addition, he states he will receive his covid booster at his local pharmacy.  Scheduling request sent to cancel Monday's appointment per patient request. Also, confirmed patient for a follow-up visit with Cassandra Heilingoetter, PA-C on 10/24 at 10 am. Patient and Heilingoetter agreeable to this time. Scheduling message sent accordingly.

## 2021-06-06 ENCOUNTER — Encounter (HOSPITAL_BASED_OUTPATIENT_CLINIC_OR_DEPARTMENT_OTHER): Payer: Self-pay | Admitting: Urology

## 2021-06-06 ENCOUNTER — Inpatient Hospital Stay (HOSPITAL_BASED_OUTPATIENT_CLINIC_OR_DEPARTMENT_OTHER)
Admission: EM | Admit: 2021-06-06 | Discharge: 2021-06-11 | DRG: 204 | Disposition: A | Discharge status: Home / Self Care | Payer: Medicare Other | Attending: Family Medicine | Admitting: Family Medicine

## 2021-06-06 ENCOUNTER — Other Ambulatory Visit: Payer: Self-pay

## 2021-06-06 ENCOUNTER — Emergency Department (HOSPITAL_BASED_OUTPATIENT_CLINIC_OR_DEPARTMENT_OTHER): Payer: Medicare Other

## 2021-06-06 DIAGNOSIS — C7931 Secondary malignant neoplasm of brain: Secondary | ICD-10-CM | POA: Diagnosis present

## 2021-06-06 DIAGNOSIS — Z86711 Personal history of pulmonary embolism: Secondary | ICD-10-CM | POA: Diagnosis present

## 2021-06-06 DIAGNOSIS — J90 Pleural effusion, not elsewhere classified: Secondary | ICD-10-CM | POA: Diagnosis present

## 2021-06-06 DIAGNOSIS — K219 Gastro-esophageal reflux disease without esophagitis: Secondary | ICD-10-CM | POA: Diagnosis present

## 2021-06-06 DIAGNOSIS — Z7901 Long term (current) use of anticoagulants: Secondary | ICD-10-CM

## 2021-06-06 DIAGNOSIS — I252 Old myocardial infarction: Secondary | ICD-10-CM | POA: Diagnosis not present

## 2021-06-06 DIAGNOSIS — Z20822 Contact with and (suspected) exposure to covid-19: Secondary | ICD-10-CM | POA: Diagnosis present

## 2021-06-06 DIAGNOSIS — Z981 Arthrodesis status: Secondary | ICD-10-CM | POA: Diagnosis not present

## 2021-06-06 DIAGNOSIS — I951 Orthostatic hypotension: Secondary | ICD-10-CM | POA: Diagnosis present

## 2021-06-06 DIAGNOSIS — G8929 Other chronic pain: Secondary | ICD-10-CM | POA: Diagnosis present

## 2021-06-06 DIAGNOSIS — Z79899 Other long term (current) drug therapy: Secondary | ICD-10-CM

## 2021-06-06 DIAGNOSIS — F319 Bipolar disorder, unspecified: Secondary | ICD-10-CM | POA: Diagnosis present

## 2021-06-06 DIAGNOSIS — Z7902 Long term (current) use of antithrombotics/antiplatelets: Secondary | ICD-10-CM

## 2021-06-06 DIAGNOSIS — Z8249 Family history of ischemic heart disease and other diseases of the circulatory system: Secondary | ICD-10-CM

## 2021-06-06 DIAGNOSIS — I255 Ischemic cardiomyopathy: Secondary | ICD-10-CM | POA: Diagnosis present

## 2021-06-06 DIAGNOSIS — Z8673 Personal history of transient ischemic attack (TIA), and cerebral infarction without residual deficits: Secondary | ICD-10-CM

## 2021-06-06 DIAGNOSIS — M797 Fibromyalgia: Secondary | ICD-10-CM | POA: Diagnosis present

## 2021-06-06 DIAGNOSIS — Z85828 Personal history of other malignant neoplasm of skin: Secondary | ICD-10-CM

## 2021-06-06 DIAGNOSIS — M1990 Unspecified osteoarthritis, unspecified site: Secondary | ICD-10-CM | POA: Diagnosis present

## 2021-06-06 DIAGNOSIS — C7A1 Malignant poorly differentiated neuroendocrine tumors: Secondary | ICD-10-CM | POA: Diagnosis present

## 2021-06-06 DIAGNOSIS — J309 Allergic rhinitis, unspecified: Secondary | ICD-10-CM | POA: Diagnosis present

## 2021-06-06 DIAGNOSIS — I959 Hypotension, unspecified: Secondary | ICD-10-CM | POA: Diagnosis not present

## 2021-06-06 DIAGNOSIS — M109 Gout, unspecified: Secondary | ICD-10-CM | POA: Diagnosis present

## 2021-06-06 DIAGNOSIS — D631 Anemia in chronic kidney disease: Secondary | ICD-10-CM | POA: Diagnosis present

## 2021-06-06 DIAGNOSIS — Z881 Allergy status to other antibiotic agents status: Secondary | ICD-10-CM

## 2021-06-06 DIAGNOSIS — Z801 Family history of malignant neoplasm of trachea, bronchus and lung: Secondary | ICD-10-CM

## 2021-06-06 DIAGNOSIS — I129 Hypertensive chronic kidney disease with stage 1 through stage 4 chronic kidney disease, or unspecified chronic kidney disease: Secondary | ICD-10-CM | POA: Diagnosis present

## 2021-06-06 DIAGNOSIS — Z9221 Personal history of antineoplastic chemotherapy: Secondary | ICD-10-CM

## 2021-06-06 DIAGNOSIS — Z888 Allergy status to other drugs, medicaments and biological substances status: Secondary | ICD-10-CM

## 2021-06-06 DIAGNOSIS — Z87891 Personal history of nicotine dependence: Secondary | ICD-10-CM

## 2021-06-06 DIAGNOSIS — Z85118 Personal history of other malignant neoplasm of bronchus and lung: Secondary | ICD-10-CM

## 2021-06-06 DIAGNOSIS — N183 Chronic kidney disease, stage 3 unspecified: Secondary | ICD-10-CM | POA: Diagnosis present

## 2021-06-06 DIAGNOSIS — Z951 Presence of aortocoronary bypass graft: Secondary | ICD-10-CM

## 2021-06-06 DIAGNOSIS — Z955 Presence of coronary angioplasty implant and graft: Secondary | ICD-10-CM

## 2021-06-06 DIAGNOSIS — F419 Anxiety disorder, unspecified: Secondary | ICD-10-CM | POA: Diagnosis present

## 2021-06-06 DIAGNOSIS — Z902 Acquired absence of lung [part of]: Secondary | ICD-10-CM

## 2021-06-06 DIAGNOSIS — I480 Paroxysmal atrial fibrillation: Secondary | ICD-10-CM | POA: Diagnosis present

## 2021-06-06 DIAGNOSIS — I25119 Atherosclerotic heart disease of native coronary artery with unspecified angina pectoris: Secondary | ICD-10-CM | POA: Diagnosis present

## 2021-06-06 DIAGNOSIS — D6832 Hemorrhagic disorder due to extrinsic circulating anticoagulants: Secondary | ICD-10-CM | POA: Diagnosis present

## 2021-06-06 DIAGNOSIS — R042 Hemoptysis: Secondary | ICD-10-CM | POA: Diagnosis present

## 2021-06-06 DIAGNOSIS — E7800 Pure hypercholesterolemia, unspecified: Secondary | ICD-10-CM | POA: Diagnosis present

## 2021-06-06 DIAGNOSIS — Z923 Personal history of irradiation: Secondary | ICD-10-CM

## 2021-06-06 LAB — CBC WITH DIFFERENTIAL/PLATELET
Abs Immature Granulocytes: 0.03 10*3/uL (ref 0.00–0.07)
Basophils Absolute: 0 10*3/uL (ref 0.0–0.1)
Basophils Relative: 1 %
Eosinophils Absolute: 0.1 10*3/uL (ref 0.0–0.5)
Eosinophils Relative: 2 %
HCT: 33.3 % — ABNORMAL LOW (ref 39.0–52.0)
Hemoglobin: 10.8 g/dL — ABNORMAL LOW (ref 13.0–17.0)
Immature Granulocytes: 0 %
Lymphocytes Relative: 14 %
Lymphs Abs: 1.2 10*3/uL (ref 0.7–4.0)
MCH: 31.2 pg (ref 26.0–34.0)
MCHC: 32.4 g/dL (ref 30.0–36.0)
MCV: 96.2 fL (ref 80.0–100.0)
Monocytes Absolute: 0.5 10*3/uL (ref 0.1–1.0)
Monocytes Relative: 5 %
Neutro Abs: 6.8 10*3/uL (ref 1.7–7.7)
Neutrophils Relative %: 78 %
Platelets: 187 10*3/uL (ref 150–400)
RBC: 3.46 MIL/uL — ABNORMAL LOW (ref 4.22–5.81)
RDW: 15.3 % (ref 11.5–15.5)
WBC: 8.7 10*3/uL (ref 4.0–10.5)
nRBC: 0 % (ref 0.0–0.2)

## 2021-06-06 LAB — RESP PANEL BY RT-PCR (FLU A&B, COVID) ARPGX2
Influenza A by PCR: NEGATIVE
Influenza B by PCR: NEGATIVE
SARS Coronavirus 2 by RT PCR: NEGATIVE

## 2021-06-06 LAB — TROPONIN I (HIGH SENSITIVITY): Troponin I (High Sensitivity): 3 ng/L (ref <18)

## 2021-06-06 LAB — COMPREHENSIVE METABOLIC PANEL
ALT: 16 U/L (ref 0–44)
AST: 19 U/L (ref 15–41)
Albumin: 4.2 g/dL (ref 3.5–5.0)
Alkaline Phosphatase: 88 U/L (ref 38–126)
Anion gap: 8 (ref 5–15)
BUN: 18 mg/dL (ref 8–23)
CO2: 24 mmol/L (ref 22–32)
Calcium: 9.4 mg/dL (ref 8.9–10.3)
Chloride: 104 mmol/L (ref 98–111)
Creatinine, Ser: 1.13 mg/dL (ref 0.61–1.24)
GFR, Estimated: >60 mL/min (ref >60)
Glucose, Bld: 98 mg/dL (ref 70–99)
Potassium: 3.9 mmol/L (ref 3.5–5.1)
Sodium: 136 mmol/L (ref 135–145)
Total Bilirubin: 0.5 mg/dL (ref 0.3–1.2)
Total Protein: 7.2 g/dL (ref 6.5–8.1)

## 2021-06-06 LAB — BRAIN NATRIURETIC PEPTIDE: B Natriuretic Peptide: 83.3 pg/mL (ref 0.0–100.0)

## 2021-06-06 MED ORDER — ONDANSETRON HCL 4 MG PO TABS
4.0000 mg | ORAL_TABLET | Freq: Four times a day (QID) | ORAL | Status: DC | PRN
  Administered 2021-06-07: 4 mg via ORAL
  Filled 2021-06-06: qty 1

## 2021-06-06 MED ORDER — HYDROMORPHONE HCL 1 MG/ML IJ SOLN
1.0000 mg | INTRAMUSCULAR | Status: AC | PRN
  Administered 2021-06-06 – 2021-06-07 (×2): 1 mg via INTRAVENOUS
  Filled 2021-06-06 (×2): qty 1

## 2021-06-06 MED ORDER — SODIUM CHLORIDE 0.9 % IV BOLUS
1000.0000 mL | Freq: Once | INTRAVENOUS | Status: AC
  Administered 2021-06-06: 1000 mL via INTRAVENOUS

## 2021-06-06 MED ORDER — ONDANSETRON HCL 4 MG/2ML IJ SOLN
4.0000 mg | Freq: Four times a day (QID) | INTRAMUSCULAR | Status: DC | PRN
  Administered 2021-06-07: 4 mg via INTRAVENOUS
  Filled 2021-06-06: qty 2

## 2021-06-06 MED ORDER — ACETAMINOPHEN 325 MG PO TABS
650.0000 mg | ORAL_TABLET | Freq: Four times a day (QID) | ORAL | Status: DC | PRN
  Administered 2021-06-08: 650 mg via ORAL
  Filled 2021-06-06: qty 2

## 2021-06-06 MED ORDER — OXYCODONE HCL 5 MG PO TABS
5.0000 mg | ORAL_TABLET | ORAL | Status: DC | PRN
  Administered 2021-06-07 (×3): 5 mg via ORAL
  Filled 2021-06-06 (×3): qty 1

## 2021-06-06 MED ORDER — ACETAMINOPHEN 650 MG RE SUPP: 650.0000 mg | Freq: Four times a day (QID) | RECTAL | Status: DC | PRN

## 2021-06-06 MED ORDER — HYDROMORPHONE HCL 1 MG/ML IJ SOLN
1.0000 mg | Freq: Once | INTRAMUSCULAR | Status: AC
  Administered 2021-06-06: 1 mg via INTRAVENOUS
  Filled 2021-06-06: qty 1

## 2021-06-06 MED ORDER — SENNOSIDES-DOCUSATE SODIUM 8.6-50 MG PO TABS: 1.0000 | ORAL_TABLET | Freq: Every evening | ORAL | Status: DC | PRN

## 2021-06-06 NOTE — ED Notes (Signed)
Attempted report x1. This nurse will call back in 10 min and attempt report

## 2021-06-06 NOTE — ED Notes (Signed)
[facility] called for report, ETA 15 min

## 2021-06-06 NOTE — H&P (Signed)
History and Physical    ARTHUR SPEAGLE WGN:[id number] DOB: 1955/11/11 DOA: 06/06/2021  PCP: [name], MD   Patient coming from: Home   Chief Complaint: Coughing up blood  HPI: [name] is a 65 y.o. 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  Patient reports that he has had intermittent pain in the right lower anterolateral chest since his lobectomy in April, was experiencing a bout of that pain on 06/04/2021, had specks of blood in his sputum at that time, and sought evaluation in the emergency department where he had a CTA chest with minimal right pleural effusion and no PE.  There was no pain or hemoptysis the following day, but he then had recurrent pain and hemoptysis today, coughing up 10-15 red globs the size of a pencil eraser.  Denies any new dyspnea, denies fevers or chills, and denies any leg swelling or orthopnea.  He took his Plavix and Xarelto today at approximately 1 PM.  [facility] ED Course: Upon arrival to the ED, patient is found to be afebrile, saturating 9200% on room air, tachycardic to 110s, and with stable blood pressure.  EKG features sinus tachycardia and radiographs of the right ribs and chest are negative for rib fracture and notable for persistent and perhaps slightly increased trace volume right pleural effusion.  Chemistry panel unremarkable and CBC with stable normocytic anemia, hemoglobin 10.8.  Troponin and BNP were normal.  COVID and influenza PCR negative.  Patient was given multiple doses of IV Dilaudid, 1 L of saline, and transferred to [facility] long hospital for ongoing evaluation and management.  Review of Systems:  All other  systems reviewed and apart from HPI, are negative.  Past Medical History:  Diagnosis Date   Anxiety    Arthritis    Basal cell carcinoma (BCC) of forehead    CAD (coronary artery disease)    a. 10/2015 ant STEMI >> LHC with 3 v CAD; oLAD tx with POBA >> emergent CABG. b. Multiple evals since that time, early graft failure of SVG-RCA by cath 03/2016. c. 2/19 PCI/DES x1 to pRCA, normal EF.   Carotid artery disease ([place])    a. 40-59% BICA 02/2018.   Depression    Dyspnea    Ectopic atrial tachycardia (HCC)    Esophageal reflux    eosinophil esophagitis   Family history of adverse reaction to anesthesia    "sister has PONV" (06/21/2017)   Former tobacco use    Gout    Hepatitis C    "treated and cured" (06/21/2017)   High cholesterol    History of kidney stones    Hypertension    Ischemic cardiomyopathy    a. EF 25-30% at intraop TEE 4/17  //  b. Limited Echo 5/17 - EF 45-50%, mild ant HK. c. EF 55-65% by cath 09/2017.   Migraine    "3-4/yr" (06/21/2017)   Myocardial infarction ([place]) 10/2015   Palpitations    Sinus bradycardia    a. HR dropping into 40s in 02/2016 -> BB reduced.   Stroke [facility]) 10/2016   "small one; sometimes my memory/cognitive issues" (06/21/2017)   Symptomatic hypotension    a. 02/2016 ER visit -> meds reduced.  Syncope    Wears dentures    Wears glasses     Past Surgical History:  Procedure Laterality Date   25 GAUGE PARS PLANA VITRECTOMY WITH 20 GAUGE MVR PORT  12/31/2020   Procedure: 25 GAUGE PARS PLANA VITRECTOMY WITH 20 GAUGE MVR PORT;  Surgeon: [name], MD;  Location: [place];  Service: Thoracic;;   ANTERIOR CERVICAL DECOMP/DISCECTOMY FUSION N/A 10/17/2018   Procedure: Anterior Cervical Decompression Fusion - Cervical seven -Thoracic one;  Surgeon: Consuella Lose, MD;  Location: [place];  Service: Neurosurgery;  Laterality: N/A;   BASAL CELL CARCINOMA EXCISION     "forehead   BIOPSY  07/20/2019   Procedure: BIOPSY;  Surgeon: Carol Ada,  MD;  Location: [facility];  Service: Endoscopy;;   BRONCHIAL BIOPSY  12/24/2020   Procedure: BRONCHIAL BIOPSIES;  Surgeon: Garner Nash, DO;  Location: [place] [facility];  Service: Pulmonary;;   BRONCHIAL BRUSHINGS  12/24/2020   Procedure: BRONCHIAL BRUSHINGS;  Surgeon: Garner Nash, DO;  Location: [place];  Service: Pulmonary;;   BRONCHIAL NEEDLE ASPIRATION BIOPSY  12/24/2020   Procedure: BRONCHIAL NEEDLE ASPIRATION BIOPSIES;  Surgeon: Garner Nash, DO;  Location: [place];  Service: Pulmonary;;   CARDIAC CATHETERIZATION N/A 11/28/2015   Procedure: Left Heart Cath and Coronary Angiography;  Surgeon: [name], MD;  Location: [employer] [facility];  Service: Cardiovascular;  Laterality: N/A;   CARDIAC CATHETERIZATION N/A 11/28/2015   Procedure: Coronary Balloon Angioplasty;  Surgeon: [name], MD;  Location: [place] CV LAB;  Service: Cardiovascular;  Laterality: N/A;  ostial LAD   CARDIAC CATHETERIZATION N/A 11/28/2015   Procedure: Coronary/Graft Angiography;  Surgeon: [name], MD;  Location: [place] [facility];  Service: Cardiovascular;  Laterality: N/A;  coronaries only    CARDIAC CATHETERIZATION N/A 04/21/2016   Procedure: Left Heart Cath and Coronary Angiography;  Surgeon: Wellington Hampshire, MD;  Location: [place] CV LAB;  Service: Cardiovascular;  Laterality: N/A;   CARDIAC CATHETERIZATION N/A 06/14/2016   Procedure: Left Heart Cath and Cors/Grafts Angiography;  Surgeon: Lorretta Harp, MD;  Location: [place] CV [facility];  Service: Cardiovascular;  Laterality: N/A;   CARDIAC CATHETERIZATION N/A 09/08/2016   Procedure: Left Heart Cath and Cors/Grafts Angiography;  Surgeon: Wellington Hampshire, MD;  Location: [place] [facility];  Service: Cardiovascular;  Laterality: N/A;   CARDIAC CATHETERIZATION     CORONARY ARTERY BYPASS GRAFT N/A 11/28/2015   Procedure: CORONARY ARTERY BYPASS GRAFTING (CABG) TIMES FIVE USING LEFT INTERNAL MAMMARY ARTERY AND  RIGHT GREATER SAPHENOUS,VIEN HARVEATED BY ENDOVIEN, INTRAOPPRATIVE TEE;  Surgeon: Gaye Pollack, MD;  Location: [place];  Service: Open Heart Surgery;  Laterality: N/A;   CORONARY STENT INTERVENTION N/A 10/05/2017   Procedure: CORONARY STENT INTERVENTION;  Surgeon: [name], MD;  Location: [place] [facility];  Service: Cardiovascular;  Laterality: N/A;   ESOPHAGOGASTRODUODENOSCOPY (EGD) WITH PROPOFOL N/A 07/20/2019   Procedure: ESOPHAGOGASTRODUODENOSCOPY (EGD) WITH PROPOFOL;  Surgeon: Carol Ada, MD;  [facility];  Service: Endoscopy;  Laterality: N/A;   ESOPHAGOGASTRODUODENOSCOPY (EGD) WITH PROPOFOL N/A 01/30/2021   Procedure: ESOPHAGOGASTRODUODENOSCOPY (EGD) WITH PROPOFOL;  Surgeon: Carol Ada, MD;  [facility];  Service: Endoscopy;  Laterality: N/A;   FIDUCIAL MARKER PLACEMENT  12/24/2020   Procedure: FIDUCIAL DYE MARKER PLACEMENT;  Surgeon: Garner Nash, DO;  Location: [place] [facility];  Service: Pulmonary;;   HUMERUS SURGERY Right 1969   "tumor inside bone; filled it w/bone chips"   INTERCOSTAL NERVE BLOCK Right  12/24/2020   Procedure: INTERCOSTAL NERVE BLOCK;  Surgeon: [name], MD;  Location: [place];  Service: Thoracic;  Laterality: Right;   IR IMAGING GUIDED PORT INSERTION  02/06/2021   LEFT HEART CATH AND CORS/GRAFTS ANGIOGRAPHY N/A 03/11/2017   Procedure: Left Heart Cath and Cors/Grafts Angiography;  Surgeon: Leonie Man, MD;  Location: [place] [facility];  Service: Cardiovascular;  Laterality: N/A;   LEFT HEART CATH AND CORS/GRAFTS ANGIOGRAPHY N/A 10/05/2017   Procedure: LEFT HEART CATH AND CORS/GRAFTS ANGIOGRAPHY;  Surgeon: [name], MD;  Location: [place] [facility];  Service: Cardiovascular;  Laterality: N/A;   LEFT HEART CATH AND CORS/GRAFTS ANGIOGRAPHY N/A 04/11/2019   Procedure: LEFT HEART CATH AND CORS/GRAFTS ANGIOGRAPHY;  Surgeon: [name], MD;  Location: [place] [facility];  Service: Cardiovascular;   Laterality: N/A;   NODE DISSECTION Right 12/24/2020   Procedure: NODE DISSECTION;  Surgeon: [name], MD;  Location: [place];  Service: Thoracic;  Laterality: Right;   PERIPHERAL VASCULAR CATHETERIZATION N/A 06/14/2016   Procedure: Lower Extremity Angiography;  Surgeon: Lorretta Harp, MD;  Location: [place] [facility];  Service: Cardiovascular;  Laterality: N/A;   VIDEO BRONCHOSCOPY WITH ENDOBRONCHIAL NAVIGATION Right 12/24/2020   Procedure: VIDEO BRONCHOSCOPY WITH ENDOBRONCHIAL NAVIGATION;  Surgeon: Garner Nash, DO;  Location: [place];  Service: Pulmonary;  Laterality: Right;   VIDEO BRONCHOSCOPY WITH ENDOBRONCHIAL ULTRASOUND N/A 12/24/2020   Procedure: VIDEO BRONCHOSCOPY WITH ENDOBRONCHIAL ULTRASOUND;  Surgeon: Garner Nash, DO;  Location: [place];  Service: Pulmonary;  Laterality: N/A;   VIDEO BRONCHOSCOPY WITH INSERTION OF INTERBRONCHIAL VALVE (IBV) N/A 12/31/2020   Procedure: VIDEO BRONCHOSCOPY WITH INSERTION OF INTERBRONCHIAL VALVE (IBV).VALVE IN CARTRIDGE 57m,9mm. CHEST TUBE PLACEMENT.;  Surgeon: LLajuana Matte MD;  Location[facility];  Service: Thoracic;  Laterality: N/A;    Social History:   reports that he quit smoking about 5 years ago. His smoking use included cigarettes. He has a 33.00 pack-year smoking history. He has never used smokeless tobacco. He reports current alcohol use. He reports that he does not use drugs.  Allergies  Allergen Reactions   Prednisone Other (See Comments)    States that this med makes him "crazy"   Tetanus Toxoids Swelling and Other (See Comments)    Fever, Swelling of the arm    Wellbutrin [Bupropion] Other (See Comments)    Crazy thoughts, nightmares   Indomethacin Other (See Comments)    rectal bleeding   Other     Other reaction(s): Unknown   Varenicline Other (See Comments)    Dreams Other reaction(s): Unknown Other reaction(s): Unknown    Family History  Problem Relation Age of Onset   Lung cancer Mother     Heart Problems Father    Heart attack Father 721  Stroke Father    Heart failure Father    Heart attack Maternal Grandmother    Stroke Maternal Grandmother    Heart attack Paternal Uncle    Hypertension Brother    Autoimmune disease Neg Hx      Prior to Admission medications   Medication Sig Start Date End Date Taking? Authorizing Provider  acetaminophen (TYLENOL) 500 MG tablet Take 1,000 mg by mouth every 6 (six) hours as needed for moderate pain.   Yes [provider]  amLODipine (NORVASC) 10 MG tablet TAKE 1 TABLET(10 MG) BY MOUTH DAILY 12/02/20  Yes VJettie Booze MD  atorvastatin (LIPITOR) 80 MG tablet TAKE 1 TABLET(80 MG) BY MOUTH DAILY  04/01/21  Yes [name], MD  clopidogrel (PLAVIX) 75 MG tablet TAKE 1 TABLET BY MOUTH EVERY DAY 05/21/21  Yes [name], MD  lidocaine-prilocaine (EMLA) cream Apply 1 application topically once as needed (port access). 02/27/21  Yes Aline August, MD  lisinopril (ZESTRIL) 5 MG tablet Take 1 tablet (5 mg total) by mouth daily. 05/21/21  Yes [name], MD  magnesium oxide (MAG-OX) 400 (240 Mg) MG tablet Take 1 tablet (400 mg total) by mouth daily. 04/07/21  Yes Heilingoetter, Cassandra L, PA-C  metoprolol succinate (TOPROL-XL) 25 MG 24 hr tablet Take 1 tablet (25 mg total) by mouth daily. 08/27/20  Yes [name], MD  nitroGLYCERIN (NITROSTAT) 0.4 MG SL tablet PLACE 1 TABLET UNDER THE TONGUE EVERY 5 MINUTES AS NEEDED FOR CHEST PAIN. 3 DOSES MAX 04/16/20  Yes [name], MD  oxyCODONE (ROXICODONE) 5 MG immediate release tablet Take 1 tablet (5 mg total) by mouth every 6 (six) hours as needed for up to 5 days for severe pain. 06/04/21 06/09/21 Yes Horton, Alvin Critchley, DO  amiodarone (PACERONE) 200 MG tablet Take 1 tablet (200 mg total) by mouth daily. Patient not taking: Reported on 06/07/2021 05/25/21   [name], MD  cloNIDine (CATAPRES) 0.1 MG tablet Take 0.1 mg by mouth daily as needed  (sleep). Patient not taking: Reported on 06/07/2021 03/12/21   [provider]  dextromethorphan-guaiFENesin (MUCINEX DM) 30-600 MG 12hr tablet Take 1 tablet by mouth 2 (two) times daily. 06/04/21   Horton, Alvin Critchley, DO  ondansetron (ZOFRAN) 8 MG tablet Take 1 tablet (8 mg total) by mouth every 8 (eight) hours as needed for nausea or vomiting. Starting 3 days after chemotherapy 04/01/21   Heilingoetter, Cassandra L, PA-C  pantoprazole (PROTONIX) 40 MG tablet Take 1 tablet (40 mg total) by mouth daily. 05/05/21   Quintella Reichert, MD  prochlorperazine (COMPAZINE) 10 MG tablet Take 1 tablet (10 mg total) by mouth every 6 (six) hours as needed. 01/14/21   Heilingoetter, Cassandra L, PA-C  ranolazine (RANEXA) 1000 MG SR tablet Take 1 tablet (1,000 mg total) by mouth 2 (two) times daily. 05/25/21   [name], MD  RIVAROXABAN Alveda Reasons) VTE STARTER PACK (15 & 20 MG) Take one 15 mg tablet by mouth 2 (two) times daily. On day 22, switch to one 20 mg tablet once a day. Take with Food. 04/27/21     Tiotropium Bromide-Olodaterol (STIOLTO RESPIMAT) 2.5-2.5 MCG/ACT AERS Inhale 2 puffs into the lungs daily. 01/12/21   Garner Nash, DO    Physical Exam: Vitals:   06/06/21 1815 06/06/21 1900 06/06/21 2030 06/06/21 2257  BP: (!) 141/82 125/81 120/84 128/76  Pulse: 94 85 80 65  Resp: 19 18 16 16   Temp:    99 F (37.2 C)  TempSrc:    Oral  SpO2: 100% 100% 100% 100%  Weight:      Height:        Constitutional: NAD, calm  Eyes: PERTLA, lids and conjunctivae normal ENMT: Mucous membranes are moist. Posterior pharynx clear of any exudate or lesions.   Neck:  supple, no masses  Respiratory:  no wheezing, no crackles. No accessory muscle use.  Cardiovascular: S1 & S2 heard, regular rate and rhythm. No extremity edema.   Abdomen: No distension, no tenderness, soft. Bowel sounds active.  Musculoskeletal: no clubbing / cyanosis. No joint deformity upper and lower extremities.   Skin: no significant  rashes, lesions, ulcers. Warm, dry, well-perfused. Neurologic:  CN 2-12 grossly intact. Moving all extremities. Alert and oriented.  Psychiatric: Very pleasant. Cooperative.    Labs and Imaging on Admission: I have personally reviewed following labs and imaging studies  CBC: Recent Labs  Lab 06/04/21 0835 06/06/21 1736  WBC 8.5 8.7  NEUTROABS 6.5 6.8  HGB 11.1* 10.8*  HCT 33.9* 33.3*  MCV 95.0 96.2  PLT 188 517   Basic Metabolic Panel: Recent Labs  Lab 06/04/21 0835 06/06/21 1736  NA 137 136  K 3.6 3.9  CL 107 104  CO2 21* 24  GLUCOSE 96 98  BUN 20 18  CREATININE 1.04 1.13  CALCIUM 9.2 9.4   GFR: Estimated Creatinine Clearance: 64.3 mL/min (by C-G formula based on SCr of 1.13 mg/dL). Liver Function Tests: Recent Labs  Lab 06/04/21 0835 06/06/21 1736  AST 18 19  ALT 18 16  ALKPHOS 92 88  BILITOT 0.4 0.5  PROT 6.9 7.2  ALBUMIN 3.8 4.2   No results for input(s): LIPASE, AMYLASE in the last 168 hours. No results for input(s): AMMONIA in the last 168 hours. Coagulation Profile: Recent Labs  Lab 06/04/21 0835  INR 1.3*   Cardiac Enzymes: No results for input(s): CKTOTAL, CKMB, CKMBINDEX, TROPONINI in the last 168 hours. BNP (last 3 results) No results for input(s): PROBNP in the last 8760 hours. HbA1C: No results for input(s): HGBA1C in the last 72 hours. CBG: No results for input(s): GLUCAP in the last 168 hours. Lipid Profile: No results for input(s): CHOL, HDL, LDLCALC, TRIG, CHOLHDL, LDLDIRECT in the last 72 hours. Thyroid Function Tests: No results for input(s): TSH, T4TOTAL, FREET4, T3FREE, THYROIDAB in the last 72 hours. Anemia Panel: No results for input(s): VITAMINB12, FOLATE, FERRITIN, TIBC, IRON, RETICCTPCT in the last 72 hours. Urine analysis:    Component Value Date/Time   COLORURINE YELLOW 05/05/2021 0508   APPEARANCEUR CLEAR 05/05/2021 0508   LABSPEC 1.010 05/05/2021 0508   PHURINE 6.0 05/05/2021 0508   GLUCOSEU NEGATIVE 05/05/2021  0508   HGBUR NEGATIVE 05/05/2021 0508   BILIRUBINUR NEGATIVE 05/05/2021 0508   KETONESUR NEGATIVE 05/05/2021 0508   PROTEINUR NEGATIVE 05/05/2021 0508   UROBILINOGEN 0.2 05/13/2014 0658   NITRITE NEGATIVE 05/05/2021 0508   LEUKOCYTESUR NEGATIVE 05/05/2021 0508   Sepsis Labs: @LABRCNTIP (procalcitonin:4,lacticidven:4) ) Recent Results (from the past 240 hour(s))  Resp Panel by RT-PCR (Flu A&B, Covid) Nasopharyngeal Swab     Status: None   Collection Time: 06/06/21  6:26 PM   Specimen: Nasopharyngeal Swab; Nasopharyngeal(NP) swabs in vial transport medium  Result Value Ref Range Status   SARS Coronavirus 2 by RT PCR NEGATIVE NEGATIVE Final    Comment: (NOTE) SARS-CoV-2 target nucleic acids are NOT DETECTED.  The SARS-CoV-2 RNA is generally detectable in upper respiratory specimens during the acute phase of infection. The lowest concentration of SARS-CoV-2 viral copies this assay can detect is 138 copies/mL. A negative result does not preclude SARS-Cov-2 infection and should not be used as the sole basis for treatment or other patient management decisions. A negative result may occur with  improper specimen collection/handling, submission of specimen other than nasopharyngeal swab, presence of viral mutation(s) within the areas targeted by this assay, and inadequate number of viral copies(<138 copies/mL). A negative result must be combined with clinical observations, patient history, and epidemiological information. The expected result is Negative.  Fact Sheet for Patients:  EntrepreneurPulse.com.au  Fact Sheet for Healthcare Providers:  IncredibleEmployment.be  This test is no t yet approved or cleared by the [place] and  has been authorized for detection and/or diagnosis of SARS-CoV-2 by FDA under an Emergency Use Authorization (EUA). This EUA will remain  in effect (meaning this test can be used) for the duration of the COVID-19  declaration under Section 564(b)(1) of the Act, 21 U.S.C.section 360bbb-3(b)(1), unless the authorization is terminated  or revoked sooner.       Influenza A by PCR NEGATIVE NEGATIVE Final   Influenza B by PCR NEGATIVE NEGATIVE Final    Comment: (NOTE) The Xpert Xpress SARS-CoV-2/FLU/RSV plus assay is intended as an aid in the diagnosis of influenza from Nasopharyngeal swab specimens and should not be used as a sole basis for treatment. Nasal washings and aspirates are unacceptable for Xpert Xpress SARS-CoV-2/FLU/RSV testing.  Fact Sheet for Patients: EntrepreneurPulse.com.au  Fact Sheet for Healthcare Providers: IncredibleEmployment.be  This test is not yet approved or cleared by the [place] FDA and has been authorized for detection and/or diagnosis of SARS-CoV-2 by FDA under an Emergency Use Authorization (EUA). This EUA will remain in effect (meaning this test can be used) for the duration of the COVID-19 declaration under Section 564(b)(1) of the Act, 21 U.S.C. section 360bbb-3(b)(1), unless the authorization is terminated or revoked.  Performed at [facility], [address] [place], [place] [address]      Radiological Exams on Admission: DG Ribs Unilateral W/Chest Right  Result Date: 06/06/2021 CLINICAL DATA:  right sided rib pain EXAM: RIGHT RIBS AND CHEST - 3+ VIEW COMPARISON:  Chest x-ray 06/04/2021, CT chest 06/04/2021 FINDINGS: Right chest wall Port-A-Cath with tip overlying right atrium just distal to the superior cavoatrial junction. The heart and mediastinal contours are unchanged. Aortic calcification. Surgical hardware overlies the mediastinum. No focal consolidation. No pulmonary edema. Persistent, possibly slightly increased in size, trace volume right pleural effusion. No pneumothorax. Punctate round metallic BB marker noted overlying the right eleventh rib. No acute displaced right fracture or other bone  lesions are seen involving the ribs. No acute osseous abnormality. Multilevel degenerative changes of the spine. IMPRESSION: 1. No acute displaced right rib fracture. Please note, nondisplaced rib fractures may be occult on radiograph. 2. Persistent, possibly slightly increased in size, trace volume right pleural effusion. Electronically Signed   By: Iven Finn M.D.   On: 06/06/2021 16:52    EKG: Independently reviewed. Sinus tachycardia, rate 116.   Assessment/Plan   1. Hemoptysis  - Pt with hx of RUL neuroendocrine tumor and PE in August on Xarelto and Plavix p/w intermittent hemoptysis since 06/04/21  - No massive hemoptysis, respiratory distress, hypoxia, or hypotension   - CTA chest 10/6 with no PE and minimal right pleural effusion  - Stable plain radiographs today, Hgb stable, BNP normal, no infectious s/s   - Hold Plavix and Xarelto (last dose ~1pm today), monitor clinically, may need bronchoscopy    2. 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 Hx of PE  - No PE on CTA from 06/04/21  - Hold Xarelto for now as above    4. PAF  - In sinus rhythm on admission  - Hold Xarelto for now as above, continue metoprolol  - Pt not sure if amiodarone was stopped but has not been taking, advised to clarify with his cardiologist    5. Right pleural effusion  - Right  pleural effusion noted on imaging since June 2022, appeared minimal on CTA from 06/04/21 and stable or slightly increased on plain radiographs today    6. CAD  - S/p CABG in 2017 and DES to RCA in 2019  - No anginal complaints  - Continue Toprol, Lipitor, and Ranexa; hold Plavix initially    DVT prophylaxis: SCDs, Xarelto pta  Code Status: Full Level of Care: Level of care: Med-Surg Family Communication: none present  Disposition  Plan:  Patient is from: Home  Anticipated d/c is to: Home  Anticipated d/c date is: 06/09/21  Patient currently: Pending repeat H&H, resolution or stability of hemoptysis, may need bronchoscopy  Consults called: none  Admission status: Inpatient     [name], MD [facility]  06/07/2021, 12:33 AM

## 2021-06-06 NOTE — ED Triage Notes (Addendum)
Hematemesis/ bloody sputum with cough this am, states worsening chest right rib pain since this morning, but had same symptoms x 2 days ago.  Was seen here 2 days ago, states pleural effusion.  H/o lung CA but in remission.  Last scan negative.  Pt on xarelto x 5 weeks

## 2021-06-06 NOTE — ED Provider Notes (Signed)
[facility] Provider Note   CSN: [id number] Arrival date & time: 06/06/21  1613     History Chief Complaint  Patient presents with   Chest Pain   Hematemesis    [name] is a 65 y.o. male history of lung cancer with mets to brain, recent PE on Xarelto, CAD status post CABG here is presenting with hemoptysis.  Patient had an episode of hemoptysis 2 days ago.  Patient came to the ED and a CTA that showed a pleural effusion but no recurrent cancer no obvious source of bleeding.  He states that he was doing well yesterday.  He states that today he had 2 further episodes of hemoptysis.  He states that he was coughing and had an episode of hemoptysis that looks like jellyfish.  He states that there is some small amount of clots and it is the size of a teaspoon.  Patient had another episode afterwards.  He went to his office and had right-sided chest pain.  Patient continues to take his Xarelto.  The history is provided by the patient.      Past Medical History:  Diagnosis Date   Anxiety    Arthritis    Basal cell carcinoma (BCC) of forehead    CAD (coronary artery disease)    a. 10/2015 ant STEMI >> LHC with 3 v CAD; oLAD tx with POBA >> emergent CABG. b. Multiple evals since that time, early graft failure of SVG-RCA by cath 03/2016. c. 2/19 PCI/DES x1 to pRCA, normal EF.   Carotid artery disease ([place])    a. 40-59% BICA 02/2018.   Depression    Dyspnea    Ectopic atrial tachycardia (HCC)    Esophageal reflux    eosinophil esophagitis   Family history of adverse reaction to anesthesia    "sister has PONV" (06/21/2017)   Former tobacco use    Gout    Hepatitis C    "treated and cured" (06/21/2017)   High cholesterol    History of kidney stones    Hypertension    Ischemic cardiomyopathy    a. EF 25-30% at intraop TEE 4/17  //  b. Limited Echo 5/17 - EF 45-50%, mild ant HK. c. EF 55-65% by cath 09/2017.   Migraine    "3-4/yr" (06/21/2017)    Myocardial infarction ([place]) 10/2015   Palpitations    Sinus bradycardia    a. HR dropping into 40s in 02/2016 -> BB reduced.   Stroke [facility]) 10/2016   "small one; sometimes my memory/cognitive issues" (06/21/2017)   Symptomatic hypotension    a. 02/2016 ER visit -> meds reduced.   Syncope    Wears dentures    Wears glasses     Patient Active Problem List   Diagnosis Date Noted   History of pulmonary embolus (PE) 04/29/2021   Anemia 04/29/2021   Hypotension due to hypovolemia 04/28/2021   Hypomagnesemia 04/08/2021   Port-A-Cath in place 04/07/2021   Protein-calorie malnutrition, severe 03/25/2021   CAP (community acquired pneumonia) 03/23/2021   PNA (pneumonia) 03/22/2021   Community acquired pneumonia 03/22/2021   Sepsis ([place]) 03/22/2021   CKD (chronic kidney disease), stage III ([place]) 03/22/2021   Hypoxia 02/28/2021   Dyspnea 02/28/2021   AKI (acute kidney injury) ([place]) 02/25/2021   AF (paroxysmal atrial fibrillation) ([place]) 02/25/2021   Pleural effusion 02/25/2021   Neutropenia, drug-induced ([place]) 02/24/2021   Positive blood cultures 02/17/2021   Positive blood culture 02/16/2021   Right flank pain 02/09/2021  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[place]) 01/30/2021   Encounter for antineoplastic chemotherapy 01/29/2021   Goals of care, counseling/discussion 01/14/2021   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   Acute back pain with sciatica 01/12/2021   Allergic rhinitis 01/12/2021   Amnesia 01/12/2021   Kidney stone 01/12/2021   Sciatica 01/12/2021   Bipolar disorder ([place]) 01/12/2021   S/P lobectomy of lung 12/24/2020   Solitary lung nodule 11/28/2020   Chest pain 11/07/2019   Gastroesophageal reflux disease    Cervical radiculopathy 10/17/2018   Preoperative clearance 10/04/2018   Palpitations    Coronary artery disease involving coronary bypass graft of native heart with angina  pectoris ([place])    Transient loss of consciousness 03/24/2018   Ectopic atrial tachycardia ([place]) 02/09/2018   Central chest pain 03/10/2017   Family hx-stroke 11/10/2016   Stroke-like episode ([place]) - R brain, s/p tPA 11/09/2016   Unstable angina ([place]) 09/07/2016   Claudication of both lower extremities ([place])    Pure hypercholesterolemia    Tobacco abuse disorder    CAD of autologous artery bypass graft without angina    Chest pain at rest 06/10/2016   Abnormal nuclear stress test - HIGH RISK 04/20/2016   Old MI (myocardial infarction)    Essential hypertension 02/26/2016   Ischemic cardiomyopathy 12/25/2015   Hyperlipidemia LDL goal <70 12/25/2015   Mild tobacco abuse in early remission 11/28/2015   Coronary artery disease involving native coronary artery of native heart with angina pectoris ([place]) 11/28/2015   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   Acute MI anterior wall first episode care [facility])    Precordial chest pain 03/07/2015   Gout attack 03/07/2015   Mixed bipolar I disorder ([place]) 03/07/2015   Fibromyalgia 07/09/2014   Gout 07/09/2014   Anxiety 07/09/2014   Depression 07/09/2014   Hepatitis C 97/67/3419   Eosinophilic esophagitis 37/90/2409    Past Surgical History:  Procedure Laterality Date   25 GAUGE PARS PLANA VITRECTOMY WITH 20 GAUGE MVR PORT  12/31/2020   Procedure: 25 GAUGE PARS PLANA VITRECTOMY WITH 20 GAUGE MVR PORT;  Surgeon: [name], MD;  [facility];  Service: Thoracic;;   ANTERIOR CERVICAL DECOMP/DISCECTOMY FUSION N/A 10/17/2018   Procedure: Anterior Cervical Decompression Fusion - Cervical seven -Thoracic one;  Surgeon: Consuella Lose, MD;  Location: [place];  Service: Neurosurgery;  Laterality: N/A;   BASAL CELL CARCINOMA EXCISION     "forehead   BIOPSY  07/20/2019   Procedure: BIOPSY;  Surgeon: Carol Ada, MD;  [facility]: [facility];  Service: Endoscopy;;   BRONCHIAL BIOPSY  12/24/2020   Procedure: BRONCHIAL BIOPSIES;  Surgeon: Garner Nash,  DO;  Location: [place] [facility];  Service: Pulmonary;;   BRONCHIAL BRUSHINGS  12/24/2020   Procedure: BRONCHIAL BRUSHINGS;  Surgeon: Garner Nash, DO;  Location: [place];  Service: Pulmonary;;   BRONCHIAL NEEDLE ASPIRATION BIOPSY  12/24/2020   Procedure: BRONCHIAL NEEDLE ASPIRATION BIOPSIES;  Surgeon: Garner Nash, DO;  Location: [place];  Service: Pulmonary;;   CARDIAC CATHETERIZATION N/A 11/28/2015   Procedure: Left Heart Cath and Coronary Angiography;  Surgeon: [name], MD;  Location: [place] [facility];  Service: Cardiovascular;  Laterality: N/A;   CARDIAC CATHETERIZATION N/A 11/28/2015   Procedure: Coronary Balloon Angioplasty;  Surgeon: [name], MD;  Location: [place] [facility];  Service: Cardiovascular;  Laterality: N/A;  ostial LAD   CARDIAC CATHETERIZATION N/A 11/28/2015   Procedure: Coronary/Graft  Angiography;  Surgeon: [name], MD;  Location: [place] [facility];  Service: Cardiovascular;  Laterality: N/A;  coronaries only    CARDIAC CATHETERIZATION N/A 04/21/2016   Procedure: Left Heart Cath and Coronary Angiography;  Surgeon: Wellington Hampshire, MD;  Location: [place] [facility];  Service: Cardiovascular;  Laterality: N/A;   CARDIAC CATHETERIZATION N/A 06/14/2016   Procedure: Left Heart Cath and Cors/Grafts Angiography;  Surgeon: Lorretta Harp, MD;  Location: [place] [facility];  Service: Cardiovascular;  Laterality: N/A;   CARDIAC CATHETERIZATION N/A 09/08/2016   Procedure: Left Heart Cath and Cors/Grafts Angiography;  Surgeon: Wellington Hampshire, MD;  Location: [place] [facility];  Service: Cardiovascular;  Laterality: N/A;   CARDIAC CATHETERIZATION     CORONARY ARTERY BYPASS GRAFT N/A 11/28/2015   Procedure: CORONARY ARTERY BYPASS GRAFTING (CABG) TIMES FIVE USING LEFT INTERNAL MAMMARY ARTERY AND RIGHT GREATER SAPHENOUS,VIEN HARVEATED BY ENDOVIEN, INTRAOPPRATIVE TEE;  Surgeon: Gaye Pollack, MD;  Location: [place];  Service: Open Heart  Surgery;  Laterality: N/A;   CORONARY STENT INTERVENTION N/A 10/05/2017   Procedure: CORONARY STENT INTERVENTION;  Surgeon: [name], MD;  Location: [place] [facility];  Service: Cardiovascular;  Laterality: N/A;   ESOPHAGOGASTRODUODENOSCOPY (EGD) WITH PROPOFOL N/A 07/20/2019   Procedure: ESOPHAGOGASTRODUODENOSCOPY (EGD) WITH PROPOFOL;  Surgeon: Carol Ada, MD;  [facility];  Service: Endoscopy;  Laterality: N/A;   ESOPHAGOGASTRODUODENOSCOPY (EGD) WITH PROPOFOL N/A 01/30/2021   Procedure: ESOPHAGOGASTRODUODENOSCOPY (EGD) WITH PROPOFOL;  Surgeon: Carol Ada, MD;  [facility];  Service: Endoscopy;  Laterality: N/A;   FIDUCIAL MARKER PLACEMENT  12/24/2020   Procedure: FIDUCIAL DYE MARKER PLACEMENT;  Surgeon: Garner Nash, DO;  Location: [place] [facility];  Service: Pulmonary;;   HUMERUS SURGERY Right 1969   "tumor inside bone; filled it w/bone chips"   INTERCOSTAL NERVE BLOCK Right 12/24/2020   Procedure: INTERCOSTAL NERVE BLOCK;  Surgeon: [name], MD;  Location: [place];  Service: Thoracic;  Laterality: Right;   IR IMAGING GUIDED PORT INSERTION  02/06/2021   LEFT HEART CATH AND CORS/GRAFTS ANGIOGRAPHY N/A 03/11/2017   Procedure: Left Heart Cath and Cors/Grafts Angiography;  Surgeon: Leonie Man, MD;  Location: [place] [facility];  Service: Cardiovascular;  Laterality: N/A;   LEFT HEART CATH AND CORS/GRAFTS ANGIOGRAPHY N/A 10/05/2017   Procedure: LEFT HEART CATH AND CORS/GRAFTS ANGIOGRAPHY;  Surgeon: [name], MD;  Location: [place] [facility];  Service: Cardiovascular;  Laterality: N/A;   LEFT HEART CATH AND CORS/GRAFTS ANGIOGRAPHY N/A 04/11/2019   Procedure: LEFT HEART CATH AND CORS/GRAFTS ANGIOGRAPHY;  Surgeon: [name], MD;  Location: [place] [facility];  Service: Cardiovascular;  Laterality: N/A;   NODE DISSECTION Right 12/24/2020   Procedure: NODE DISSECTION;  Surgeon: [name], MD;  Location: [place];  Service:  Thoracic;  Laterality: Right;   PERIPHERAL VASCULAR CATHETERIZATION N/A 06/14/2016   Procedure: Lower Extremity Angiography;  Surgeon: Lorretta Harp, MD;  Location: [place] [facility];  Service: Cardiovascular;  Laterality: N/A;   VIDEO BRONCHOSCOPY WITH ENDOBRONCHIAL NAVIGATION Right 12/24/2020   Procedure: VIDEO BRONCHOSCOPY WITH ENDOBRONCHIAL NAVIGATION;  Surgeon: Garner Nash, DO;  Location: [place];  Service: Pulmonary;  Laterality: Right;   VIDEO BRONCHOSCOPY WITH ENDOBRONCHIAL ULTRASOUND N/A 12/24/2020   Procedure: VIDEO BRONCHOSCOPY WITH ENDOBRONCHIAL ULTRASOUND;  Surgeon: Garner Nash, DO;  Location: [place];  Service: Pulmonary;  Laterality: N/A;   VIDEO BRONCHOSCOPY WITH INSERTION OF INTERBRONCHIAL VALVE (IBV) N/A 12/31/2020  Procedure: VIDEO BRONCHOSCOPY WITH INSERTION OF INTERBRONCHIAL VALVE (IBV).VALVE IN CARTRIDGE 79mm,9mm. CHEST TUBE PLACEMENT.;  Surgeon: [name], MD;  [facility];  Service: Thoracic;  Laterality: N/A;       Family History  Problem Relation Age of Onset   Lung cancer Mother    Heart Problems Father    Heart attack Father 37   Stroke Father    Heart failure Father    Heart attack Maternal Grandmother    Stroke Maternal Grandmother    Heart attack Paternal Uncle    Hypertension Brother    Autoimmune disease Neg Hx     Social History   Tobacco Use   Smoking status: Former    Packs/day: 0.75    Years: 44.00    Pack years: 33.00    Types: Cigarettes    Quit date: 11/28/2015    Years since quitting: 5.5   Smokeless tobacco: Never  Vaping Use   Vaping Use: Never used  Substance Use Topics   Alcohol use: Yes    Comment: occ   Drug use: No    Comment: 06/21/2017 "nothing since the 1980s"    Home Medications Prior to Admission medications   Medication Sig Start Date End Date Taking? Authorizing Provider  acetaminophen (TYLENOL) 500 MG tablet Take 1,000 mg by mouth every 6 (six) hours as needed for moderate pain.     [provider]  amiodarone (PACERONE) 200 MG tablet Take 1 tablet (200 mg total) by mouth daily. 05/25/21   [name], MD  amLODipine (NORVASC) 10 MG tablet TAKE 1 TABLET(10 MG) BY MOUTH DAILY 12/02/20   [name], MD  atorvastatin (LIPITOR) 80 MG tablet TAKE 1 TABLET(80 MG) BY MOUTH DAILY 04/01/21   [name], MD  cloNIDine (CATAPRES) 0.1 MG tablet Take 0.1 mg by mouth daily as needed (sleep). 03/12/21   [provider]  clopidogrel (PLAVIX) 75 MG tablet TAKE 1 TABLET BY MOUTH EVERY DAY 05/21/21   [name], MD  dextromethorphan-guaiFENesin [facility] DM) 30-600 MG 12hr tablet Take 1 tablet by mouth 2 (two) times daily. 06/04/21   Horton, Alvin Critchley, DO  lidocaine-prilocaine (EMLA) cream Apply 1 application topically once as needed (port access). 02/27/21   Aline August, MD  lisinopril (ZESTRIL) 5 MG tablet Take 1 tablet (5 mg total) by mouth daily. 05/21/21   [name], MD  magnesium oxide (MAG-OX) 400 (240 Mg) MG tablet Take 1 tablet (400 mg total) by mouth daily. 04/07/21   Heilingoetter, Cassandra L, PA-C  metoprolol succinate (TOPROL-XL) 25 MG 24 hr tablet Take 1 tablet (25 mg total) by mouth daily. 08/27/20   [name], MD  nitroGLYCERIN (NITROSTAT) 0.4 MG SL tablet PLACE 1 TABLET UNDER THE TONGUE EVERY 5 MINUTES AS NEEDED FOR CHEST PAIN. 3 DOSES MAX 04/16/20   [name], MD  ondansetron (ZOFRAN) 8 MG tablet Take 1 tablet (8 mg total) by mouth every 8 (eight) hours as needed for nausea or vomiting. Starting 3 days after chemotherapy 04/01/21   Heilingoetter, Cassandra L, PA-C  oxyCODONE (ROXICODONE) 5 MG immediate release tablet Take 1 tablet (5 mg total) by mouth every 6 (six) hours as needed for up to 5 days for severe pain. 06/04/21 06/09/21  Horton, Alvin Critchley, DO  pantoprazole (PROTONIX) 40 MG tablet Take 1 tablet (40 mg total) by mouth daily. 05/05/21   Quintella Reichert, MD  prochlorperazine (COMPAZINE) 10 MG  tablet Take 1 tablet (10 mg total) by  mouth every 6 (six) hours as needed. 01/14/21   Heilingoetter, Cassandra L, PA-C  ranolazine (RANEXA) 1000 MG SR tablet Take 1 tablet (1,000 mg total) by mouth 2 (two) times daily. 05/25/21   [name], MD  RIVAROXABAN [employer]) VTE STARTER PACK (15 & 20 MG) Take one 15 mg tablet by mouth 2 (two) times daily. On day 22, switch to one 20 mg tablet once a day. Take with Food. 04/27/21     Tiotropium Bromide-Olodaterol (STIOLTO RESPIMAT) 2.5-2.5 MCG/ACT AERS Inhale 2 puffs into the lungs daily. 01/12/21   Garner Nash, DO    Allergies    Prednisone, Tetanus toxoids, Wellbutrin [bupropion], Indomethacin, Other, and Varenicline  Review of Systems   Review of Systems  Respiratory:  Positive for cough.   Cardiovascular:  Positive for chest pain.  All other systems reviewed and are negative.  Physical Exam Updated Vital Signs BP (!) 141/82   Pulse 94   Temp 98.7 F (37.1 C) (Oral)   Resp 19   Ht 5\' 9"  (1.753 m)   Wt 75.8 kg   SpO2 100%   BMI 24.66 kg/m   Physical Exam Vitals and nursing note reviewed.  Constitutional:      Comments: Chronically ill, uncomfortable  HENT:     Head: Normocephalic.  Eyes:     Extraocular Movements: Extraocular movements intact.     Pupils: Pupils are equal, round, and reactive to light.  Cardiovascular:     Rate and Rhythm: Normal rate and regular rhythm.     Heart sounds: Normal heart sounds.  Pulmonary:     Comments: Diminished R base  Abdominal:     General: Bowel sounds are normal.     Palpations: Abdomen is soft.  Musculoskeletal:        General: Normal range of motion.     Cervical back: Normal range of motion and neck supple.  Skin:    General: Skin is warm.  Neurological:     General: No focal deficit present.     Mental Status: He is alert and oriented to person, place, and time.  Psychiatric:        Mood and Affect: Mood normal.        Behavior: Behavior normal.    ED Results /  Procedures / Treatments   Labs (all labs ordered are listed, but only abnormal results are displayed) Labs Reviewed  CBC WITH DIFFERENTIAL/PLATELET - Abnormal; Notable for the following components:      Result Value   RBC 3.46 (*)    Hemoglobin 10.8 (*)    HCT 33.3 (*)    All other components within normal limits  RESP PANEL BY RT-PCR (FLU A&B, COVID) ARPGX2  COMPREHENSIVE METABOLIC PANEL  BRAIN NATRIURETIC PEPTIDE  TROPONIN I (HIGH SENSITIVITY)    EKG EKG Interpretation  Date/Time:  Saturday June 06 2021 16:18:38 EDT Ventricular Rate:  116 PR Interval:  146 QRS Duration: 78 QT Interval:  322 QTC Calculation: 447 R Axis:   78 Text Interpretation: Sinus tachycardia Low voltage QRS ST & T wave abnormality, consider inferior ischemia Abnormal ECG Since last tracing rate faster Confirmed by Wandra Arthurs [phone]) on 06/06/2021 5:40:47 PM  Radiology DG Ribs Unilateral W/Chest Right  Result Date: 06/06/2021 CLINICAL DATA:  right sided rib pain EXAM: RIGHT RIBS AND CHEST - 3+ VIEW COMPARISON:  Chest x-ray 06/04/2021, CT chest 06/04/2021 FINDINGS: Right chest wall Port-A-Cath with tip overlying right atrium just distal to the superior cavoatrial junction. The  heart and mediastinal contours are unchanged. Aortic calcification. Surgical hardware overlies the mediastinum. No focal consolidation. No pulmonary edema. Persistent, possibly slightly increased in size, trace volume right pleural effusion. No pneumothorax. Punctate round metallic BB marker noted overlying the right eleventh rib. No acute displaced right fracture or other bone lesions are seen involving the ribs. No acute osseous abnormality. Multilevel degenerative changes of the spine. IMPRESSION: 1. No acute displaced right rib fracture. Please note, nondisplaced rib fractures may be occult on radiograph. 2. Persistent, possibly slightly increased in size, trace volume right pleural effusion. Electronically Signed   By: Iven Finn M.D.   On: 06/06/2021 16:52    Procedures Procedures   Medications Ordered in ED Medications  HYDROmorphone (DILAUDID) injection 1 mg (has no administration in time range)  sodium chloride 0.9 % bolus 1,000 mL (has no administration in time range)    ED Course  I have reviewed the triage vital signs and the nursing notes.  Pertinent labs & imaging results that were available during my care of the patient were reviewed by me and considered in my medical decision making (see chart for details).    MDM Rules/Calculators/A&P                           [name] is a 65 y.o. male here presenting with hemoptysis.  This is a recurrent problem.  Patient is on Xarelto because of recent PE.  Patient's hemoglobin is now 10.8 from 11.8 2 days ago.  Chest x-ray showed worsening pleural effusion.  I wonder if he has recurrent cancer versus small amount of bleeding from his bronchioles.  At this point, will admit and will hold his Xarelto.  He may need a thoracentesis or bronchoscopy if he continues to have hemoptysis   Final Clinical Impression(s) / ED Diagnoses Final diagnoses:  None    Rx / DC Orders ED Discharge Orders     None        Drenda Freeze, MD 06/06/21 [place]

## 2021-06-07 LAB — BASIC METABOLIC PANEL
Anion gap: 6 (ref 5–15)
BUN: 15 mg/dL (ref 8–23)
CO2: 25 mmol/L (ref 22–32)
Calcium: 9.1 mg/dL (ref 8.9–10.3)
Chloride: 107 mmol/L (ref 98–111)
Creatinine, Ser: 0.95 mg/dL (ref 0.61–1.24)
GFR, Estimated: >60 mL/min (ref >60)
Glucose, Bld: 95 mg/dL (ref 70–99)
Potassium: 3.7 mmol/L (ref 3.5–5.1)
Sodium: 138 mmol/L (ref 135–145)

## 2021-06-07 LAB — CBC WITH DIFFERENTIAL/PLATELET
Abs Immature Granulocytes: 0.02 10*3/uL (ref 0.00–0.07)
Basophils Absolute: 0 10*3/uL (ref 0.0–0.1)
Basophils Relative: 0 %
Eosinophils Absolute: 0.5 10*3/uL (ref 0.0–0.5)
Eosinophils Relative: 7 %
HCT: 30.6 % — ABNORMAL LOW (ref 39.0–52.0)
Hemoglobin: 9.9 g/dL — ABNORMAL LOW (ref 13.0–17.0)
Immature Granulocytes: 0 %
Lymphocytes Relative: 26 %
Lymphs Abs: 1.8 10*3/uL (ref 0.7–4.0)
MCH: 31.6 pg (ref 26.0–34.0)
MCHC: 32.4 g/dL (ref 30.0–36.0)
MCV: 97.8 fL (ref 80.0–100.0)
Monocytes Absolute: 0.6 10*3/uL (ref 0.1–1.0)
Monocytes Relative: 8 %
Neutro Abs: 4.1 10*3/uL (ref 1.7–7.7)
Neutrophils Relative %: 59 %
Platelets: 184 10*3/uL (ref 150–400)
RBC: 3.13 MIL/uL — ABNORMAL LOW (ref 4.22–5.81)
RDW: 15.1 % (ref 11.5–15.5)
WBC: 7 10*3/uL (ref 4.0–10.5)
nRBC: 0 % (ref 0.0–0.2)

## 2021-06-07 LAB — TYPE AND SCREEN
ABO/RH(D): O POS
Antibody Screen: NEGATIVE

## 2021-06-07 LAB — HEMOGLOBIN
Hemoglobin: 9.5 g/dL — ABNORMAL LOW (ref 13.0–17.0)
Hemoglobin: 9.9 g/dL — ABNORMAL LOW (ref 13.0–17.0)

## 2021-06-07 LAB — HEMATOCRIT
HCT: 29.5 % — ABNORMAL LOW (ref 39.0–52.0)
HCT: 30.9 % — ABNORMAL LOW (ref 39.0–52.0)

## 2021-06-07 MED ORDER — PANTOPRAZOLE SODIUM 40 MG PO TBEC
40.0000 mg | DELAYED_RELEASE_TABLET | Freq: Every day | ORAL | Status: DC
  Administered 2021-06-07 – 2021-06-11 (×5): 40 mg via ORAL
  Filled 2021-06-07 (×5): qty 1

## 2021-06-07 MED ORDER — SODIUM CHLORIDE 0.9% FLUSH
10.0000 mL | Freq: Two times a day (BID) | INTRAVENOUS | Status: DC
  Administered 2021-06-07 – 2021-06-08 (×2): 10 mL
  Administered 2021-06-08: 20 mL
  Administered 2021-06-09 – 2021-06-11 (×4): 10 mL

## 2021-06-07 MED ORDER — CHLORHEXIDINE GLUCONATE CLOTH 2 % EX PADS
6.0000 | MEDICATED_PAD | Freq: Every day | CUTANEOUS | Status: DC
  Administered 2021-06-07 – 2021-06-11 (×5): 6 via TOPICAL

## 2021-06-07 MED ORDER — METOPROLOL SUCCINATE ER 25 MG PO TB24
25.0000 mg | ORAL_TABLET | Freq: Every day | ORAL | Status: DC
  Administered 2021-06-07: 25 mg via ORAL
  Filled 2021-06-07: qty 1

## 2021-06-07 MED ORDER — HYDROCOD POLST-CPM POLST ER 10-8 MG/5ML PO SUER
5.0000 mL | Freq: Two times a day (BID) | ORAL | Status: DC
  Administered 2021-06-07 – 2021-06-08 (×2): 5 mL via ORAL
  Filled 2021-06-07 (×4): qty 5

## 2021-06-07 MED ORDER — DM-GUAIFENESIN ER 30-600 MG PO TB12
1.0000 | ORAL_TABLET | Freq: Two times a day (BID) | ORAL | Status: DC | PRN
  Administered 2021-06-07: 1 via ORAL
  Filled 2021-06-07: qty 1

## 2021-06-07 MED ORDER — RANOLAZINE ER 500 MG PO TB12
1000.0000 mg | ORAL_TABLET | Freq: Every day | ORAL | Status: DC
  Administered 2021-06-07 – 2021-06-11 (×5): 1000 mg via ORAL
  Filled 2021-06-07 (×5): qty 2

## 2021-06-07 MED ORDER — SODIUM CHLORIDE 0.9% FLUSH: 10.0000 mL | INTRAVENOUS | Status: DC | PRN

## 2021-06-07 MED ORDER — MORPHINE SULFATE (PF) 2 MG/ML IV SOLN
1.0000 mg | INTRAVENOUS | Status: DC | PRN
  Administered 2021-06-08: 1 mg via INTRAVENOUS
  Filled 2021-06-07: qty 1

## 2021-06-07 MED ORDER — AMLODIPINE BESYLATE 10 MG PO TABS
10.0000 mg | ORAL_TABLET | Freq: Every day | ORAL | Status: DC
  Administered 2021-06-07: 10 mg via ORAL
  Filled 2021-06-07: qty 1

## 2021-06-07 MED ORDER — LISINOPRIL 5 MG PO TABS
5.0000 mg | ORAL_TABLET | Freq: Every day | ORAL | Status: DC
  Administered 2021-06-07: 5 mg via ORAL
  Filled 2021-06-07: qty 1

## 2021-06-07 MED ORDER — ATORVASTATIN CALCIUM 40 MG PO TABS
80.0000 mg | ORAL_TABLET | Freq: Every day | ORAL | Status: DC
  Administered 2021-06-07 – 2021-06-11 (×5): 80 mg via ORAL
  Filled 2021-06-07 (×5): qty 2

## 2021-06-07 NOTE — Plan of Care (Signed)
  Problem: Education: Goal: Knowledge of General Education information will improve Description Including pain rating scale, medication(s)/side effects and non-pharmacologic comfort measures Outcome: Progressing   Problem: Health Behavior/Discharge Planning: Goal: Ability to manage health-related needs will improve Outcome: Progressing   Problem: Coping: Goal: Level of anxiety will decrease Outcome: Progressing   Problem: Pain Managment: Goal: General experience of comfort will improve Outcome: Progressing   Problem: Safety: Goal: Ability to remain free from injury will improve Outcome: Progressing   Problem: Skin Integrity: Goal: Risk for impaired skin integrity will decrease Outcome: Progressing   

## 2021-06-07 NOTE — Progress Notes (Signed)
PROGRESS NOTE    [name]  BWI:[id number] DOB: December 06, 1955 DOA: 06/06/2021 PCP: [name], MD    Brief Narrative:  [name] was admitted to the hospital with the working diagnosis of hemoptysis in the setting of chronic anticoagulation related to pulmonary embolism.    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  He is s/p right upper lobectomy, brain radiation and chemotherapy.  Last treatment 03/2021. He had chronic pain s/p lobectomy.  On 10/6 patient had hemoptysis, specks of blood in his sputum associate with chest pain.  In the ED he was evaluated with CT angiography which was negative for pulm embolism and was discharged home. 48 hours later he had recurrent hemoptysis and chest pain, coughing up to 10-15 red clots the size of a pencil eraser.  No dyspnea.  On his initial physical examination he was tachycardic, 110 bpm, blood pressure 141/82, respiratory rate 18, oxygen saturation 100%, his lungs had no wheezing or rales, heart S1-S2, present, rhythmic, abdomen soft, no lower extremity edema.  Sodium 136, potassium 3.9, chloride 104, bicarb 24, glucose 98, BUN 18, creatinine 1.13, white count 8.7, hemoglobin 10.8, hematocrit 33.3, platelets 187. SARS COVID-19 negative.  CT chest and chest radiograph on 10/6 with small right pleural effusion, no infiltrates.  EKG 116 bpm, normal axis, normal intervals, sinus rhythm, no significant ST segment or T wave changes.  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  Follow up CT chest with no PE, has chronic small right pleural effusion.  No clinical signs of bronchitis, his Hgb has been stable at 9,8 from 10,8 (no frank acute blood loss anemia).  Plan to continue holding on anticoagulation for now, add antitussive agents and analgesics (morphine and oxycodone) Follow cell count in am.   2. Stage IV large cell neuroendocrine carcinoma,metastatic brain lesion.  Robotic assisted thoracotomy with right upper lobectomy 11/2020.  Chemotherapy 03/2021.   3. Paroxysmal atrial fibrillation. Continue rate control with , holding anticoagulation due to hemoptysis.   3. CAD sp stenting. Patient had CABG in 2017. DES to RCA in 2019. Continue holding clopidogrel. Continue with metoprolol, statin therapy and ranexa.   4. HTN/ dyslipidemia. Continue blood pressure control with amlodipine and lisinopril.  Continue with atorvastatin.   Patient continue to be at high risk for worsening hemoptysis   Status is: Inpatient  Remains inpatient appropriate because:Inpatient level of care appropriate due to severity of illness  Dispo: The patient is from: Home              Anticipated d/c is to: Home              Patient currently is not medically stable to d/c.   Difficult to place patient No  DVT prophylaxis: Scd   Code Status:    full  Family Communication:  I spoke with patient's wife at the bedside, we talked in detail about patient's condition, plan of care and prognosis and all questions were addressed.  Subjective: Patient with no nausea or vomiting, no dyspnea, positive chest pain pleuritic on the right side, moderate to severe in intensity, improved with analgesics, radiated to the back   Objective: Vitals:   06/06/21 2257 06/07/21 0239 06/07/21 0651 06/07/21 1113  BP: 128/76 112/77 112/72 109/71  Pulse: 65 65 62 71  Resp: 16 16 16 20   Temp: 99  F (37.2 C) 98 F (36.7 C) 98 F (36.7 C) 98.1 F (36.7 C)  TempSrc: Oral Oral Oral Oral  SpO2: 100% 100% 100% 97%  Weight:      Height:        Intake/Output Summary (Last 24 hours) at 06/07/2021 1200 Last data filed at 06/06/2021 2030 Gross per 24 hour  Intake 1000 ml  Output --  Net 1000 ml   Filed Weights   06/06/21 1618  Weight: 75.8 kg    Examination:   General: Not in pain or dyspnea. Deconditioned  Neurology: Awake and alert, non focal  E ENT: no pallor, no icterus, oral mucosa moist Cardiovascular: No JVD. S1-S2 present, rhythmic, no gallops, rubs, or murmurs. No lower extremity edema. Pulmonary: positive breath sounds bilaterally, with no wheezing, rhonchi or rales. Gastrointestinal. Abdomen  soft and non tender Skin. No rashes Musculoskeletal: no joint deformities     Data Reviewed: I have personally reviewed following labs and imaging studies  CBC: Recent Labs  Lab 06/04/21 0835 06/06/21 1736 06/07/21 0116 06/07/21 1114  WBC 8.5 8.7  --   --   NEUTROABS 6.5 6.8  --   --   HGB 11.1* 10.8* 9.5* 9.9*  HCT 33.9* 33.3* 29.5* 30.9*  MCV 95.0 96.2  --   --   PLT 188 187  --   --    Basic Metabolic Panel: Recent Labs  Lab 06/04/21 0835 06/06/21 1736 06/07/21 0116  NA 137 136 138  K 3.6 3.9 3.7  CL 107 104 107  CO2 21* 24 25  GLUCOSE 96 98 95  BUN 20 18 15   CREATININE 1.04 1.13 0.95  CALCIUM 9.2 9.4 9.1   GFR: Estimated Creatinine Clearance: 76.5 mL/min (by C-G formula based on SCr of 0.95 mg/dL). Liver Function Tests: Recent Labs  Lab 06/04/21 0835 06/06/21 1736  AST 18 19  ALT 18 16  ALKPHOS 92 88  BILITOT 0.4 0.5  PROT 6.9 7.2  ALBUMIN 3.8 4.2   No results for input(s): LIPASE, AMYLASE in the last 168 hours. No results for input(s): AMMONIA in the last 168 hours. Coagulation Profile: Recent Labs  Lab 06/04/21 0835  INR 1.3*   Cardiac Enzymes: No results for input(s): CKTOTAL, CKMB, CKMBINDEX, TROPONINI in the last 168  hours. BNP (last 3 results) No results for input(s): PROBNP in the last 8760 hours. HbA1C: No results for input(s): HGBA1C in the last 72 hours. CBG: No results for input(s): GLUCAP in the last 168 hours. Lipid Profile: No results for input(s): CHOL, HDL, LDLCALC, TRIG, CHOLHDL, LDLDIRECT in the last 72 hours. Thyroid Function Tests: No results for input(s): TSH, T4TOTAL, FREET4, T3FREE, THYROIDAB in the last 72 hours. Anemia Panel: No results for input(s): VITAMINB12, FOLATE, FERRITIN, TIBC, IRON, RETICCTPCT in the last 72 hours.    Radiology Studies: I have reviewed all of the imaging during this hospital visit personally     Scheduled Meds:  amLODipine  10 mg Oral Daily   atorvastatin  80 mg Oral Daily   Chlorhexidine Gluconate Cloth  6 each Topical Daily   lisinopril  5 mg Oral  Daily   metoprolol succinate  25 mg Oral Daily   pantoprazole  40 mg Oral Daily   ranolazine  1,000 mg Oral Q1200   sodium chloride flush  10-40 mL Intracatheter Q12H   Continuous Infusions:   LOS: 1 day        Akhila Mahnken Gerome Apley, MD

## 2021-06-07 NOTE — Progress Notes (Signed)
   06/07/21 2043  Vitals  BP (!) 86/54  MAP (mmHg) (!) 61  BP Method Automatic  Patient Position (if appropriate) Standing  Pulse Rate (!) 112  Pulse Rate Source Monitor  MEWS COLOR  MEWS Score Color Yellow  Oxygen Therapy  SpO2 100 %  MEWS Score  MEWS Temp 0  MEWS Systolic 1  MEWS Pulse 2  MEWS RR 0  MEWS LOC 0  MEWS Score 3  YELLOW MEWS initiated as pt dropped his B/P with orthostatic changes. TRH on call sent AMION page.

## 2021-06-08 ENCOUNTER — Ambulatory Visit: Payer: Medicare Other

## 2021-06-08 LAB — CBC
HCT: 31 % — ABNORMAL LOW (ref 39.0–52.0)
Hemoglobin: 9.9 g/dL — ABNORMAL LOW (ref 13.0–17.0)
MCH: 31.2 pg (ref 26.0–34.0)
MCHC: 31.9 g/dL (ref 30.0–36.0)
MCV: 97.8 fL (ref 80.0–100.0)
Platelets: 162 10*3/uL (ref 150–400)
RBC: 3.17 MIL/uL — ABNORMAL LOW (ref 4.22–5.81)
RDW: 15 % (ref 11.5–15.5)
WBC: 7.1 10*3/uL (ref 4.0–10.5)
nRBC: 0 % (ref 0.0–0.2)

## 2021-06-08 MED ORDER — OXYCODONE HCL 5 MG PO TABS
5.0000 mg | ORAL_TABLET | ORAL | Status: DC | PRN
  Administered 2021-06-08 – 2021-06-11 (×13): 5 mg via ORAL
  Filled 2021-06-08 (×13): qty 1

## 2021-06-08 MED ORDER — DICLOFENAC SODIUM 1 % EX GEL
2.0000 g | Freq: Four times a day (QID) | CUTANEOUS | Status: DC
  Administered 2021-06-08 (×3): 2 g via TOPICAL
  Filled 2021-06-08: qty 100

## 2021-06-08 MED ORDER — RIVAROXABAN 20 MG PO TABS
20.0000 mg | ORAL_TABLET | Freq: Every day | ORAL | Status: DC
  Administered 2021-06-08 – 2021-06-10 (×3): 20 mg via ORAL
  Filled 2021-06-08 (×3): qty 1

## 2021-06-08 MED ORDER — HYDROMORPHONE HCL 1 MG/ML IJ SOLN
1.0000 mg | INTRAMUSCULAR | Status: DC | PRN
  Administered 2021-06-08 – 2021-06-09 (×3): 1 mg via INTRAVENOUS
  Filled 2021-06-08 (×3): qty 1

## 2021-06-08 NOTE — Progress Notes (Signed)
Chaplain engaged in an initial visit with [name].  Chaplain provided education on the Advanced Directive, Healthcare POA document.  [name] wishes to assign his wife as his healthcare agent.  Chaplain did note to him that because they are married, she already has some decision making power if he was unable to speak for himself.  Chaplain noted that Part B of the AD document would be good to go over with his wife to begin the conversation of his wants and desires around life-prolonging measures.    [name] is looking forward to seeing some friends visiting him from out of state today that he hasn't seen in about a year or more.    Chaplain offered listening, presence, and support.  Chaplain will follow-up as needed.     06/08/21 1000  Clinical Encounter Type  Visited With Patient  Visit Type Initial;Social support

## 2021-06-08 NOTE — Progress Notes (Addendum)
PROGRESS NOTE    [name]  RUE:[id number] DOB: [birth year] DOA: 06/06/2021 PCP: [name], MD    Brief Narrative:  [name] was admitted to the hospital with the working diagnosis of hemoptysis in the setting of chronic anticoagulation related to pulmonary embolism.     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  He is s/p right upper lobectomy, brain radiation and chemotherapy.  Last treatment 03/2021. He had chronic pain s/p lobectomy.  On 10/6 patient had hemoptysis, specks of blood in his sputum associate with chest pain.  In the ED he was evaluated with CT angiography which was negative for pulmonary embolism and was discharged home. 48 hours later he had recurrent hemoptysis and chest pain, coughing up to 10-15 red clots the size of a pencil eraser.  No dyspnea.  On his initial physical examination he was tachycardic, 110 bpm, blood pressure 141/82, respiratory rate 18, oxygen saturation 100%, his lungs had no wheezing or rales, heart S1-S2, present, rhythmic, abdomen soft, no lower extremity edema.   Sodium 136, potassium 3.9, chloride 104, bicarb 24, glucose 98, BUN 18, creatinine 1.13, white count 8.7, hemoglobin 10.8, hematocrit 33.3, platelets 187. SARS COVID-19 negative.   CT chest and chest radiograph on 10/6 with small right pleural effusion, no infiltrates.   EKG 116 bpm, normal axis, normal intervals, sinus rhythm, no significant ST segment or T wave changes.  Patient has been placed on antitussive agents and anticoagulation was held. Hemoptysis has resolved but he continue to have right rib cage pain.   Patient with no clinical signs of bronchitis, his Hgb has been stable with no frank acute blood loss anemia.  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  Follow up CT chest with no PE, has chronic small right pleural effusion.  Dedicated rib films with no fracture.   Continue with antitussive agents and analgesics (hydromorphone and oxycodone). Topical diclofenac to right rib cage tender point.  His hgb continue to be stable at 9.9.  Patient has a high risk for recurrent thromboembolism, considering his history of long cancer with metastasis.  Will resume rivaroxaban, and monitor for signs of bleeding. Will contact cardiology to stop on clopidogrel, his stent has been placed more than 12 mo ago.    2. Stage IV large cell neuroendocrine carcinoma,metastatic brain lesion.  Robotic assisted thoracotomy with right upper lobectomy 11/2020.  Chemotherapy 03/2021.   Follow with Dr Earlie Server.  (added to the care team)  3. Paroxysmal atrial fibrillation.  Atrial fibrillation occurred during CT surgery. Patient has been sinus rhythm with rate control.  Continue metoprolol and will resume anticoagulation with rivaroxaban.    3. CAD sp stenting. Patient had CABG in 2017. DES to RCA in 2019. On metoprolol, statin therapy and ranexa.  Will hold on clopidogrel. Old records personally reviewed cardiology office visit on 05/21/21 Ok to stop clopidogrel if patient  having bleeding complications.  "Could stop Plavix if bleeding became an issue.  Nuisance bleeding. Iron studies done on 04/29/21." Dr Irish Lack   4. HTN/ dyslipidemia.  On amlodipine, metoprolol and lisinopril  for blood pressure control/.   On atorvastatin.    Status is: Inpatient  Remains inpatient appropriate because:Inpatient level of care appropriate due to severity of illness  Dispo: The patient is from: Home              Anticipated d/c is to: Home possible dc home tomorrow if no recurrent bleeding.               Patient currently is not medically stable to d/c.   Difficult to place patient No    DVT prophylaxis: Rivaroxaban   Code Status:    full  Family Communication:   No family at the bedside     Subjective: Patient with no nausea or vomiting, continue to have pain at the right lower rib cage are, worse to touch and movement "similar to a prior rib fracture". No cough of further hemoptysis   Objective: Vitals:   06/08/21 0056 06/08/21 0503 06/08/21 0836 06/08/21 1244  BP: 99/66 (!) 102/58 108/64 118/72  Pulse: 67 (!) 59 72 67  Resp: 16 18 20 19   Temp: 98.1 F (36.7 C) 98.2 F (36.8 C) 98.2 F (36.8 C) 98 F (36.7 C)  TempSrc: Oral Oral Oral Oral  SpO2: 98% 100% 100% 100%  Weight:      Height:       No intake or output data in the 24 hours ending 06/08/21 1250 Filed Weights   06/06/21 1618  Weight: 75.8 kg    Examination:   General: Not in pain or dyspnea.  Neurology: Awake and alert, non focal  E ENT: no pallor, no icterus, oral mucosa moist Cardiovascular: No JVD. S1-S2 present, rhythmic, no gallops, rubs, or murmurs. No lower extremity edema. Pulmonary: positive breath sounds bilaterally, adequate air movement, no wheezing, rhonchi or rales. Gastrointestinal. Abdomen soft and non tender Skin. No rashes Musculoskeletal: reproducible pain to palpation at the right lower rib cage are.      Data Reviewed: I have personally reviewed following labs and imaging studies  CBC: Recent Labs  Lab 06/04/21 0835 06/06/21 1736 06/07/21 0116 06/07/21 1114 06/07/21 2154 06/08/21 0421  WBC 8.5 8.7  --   --  7.0 7.1  NEUTROABS 6.5 6.8  --   --  4.1  --   HGB  11.1* 10.8* 9.5* 9.9* 9.9* 9.9*  HCT 33.9* 33.3* 29.5* 30.9* 30.6* 31.0*  MCV 95.0 96.2  --   --  97.8 97.8  PLT 188 187  --   --  184 161   Basic Metabolic Panel: Recent Labs  Lab 06/04/21 0835 06/06/21 1736 06/07/21 0116  NA 137 136 138  K 3.6 3.9 3.7  CL 107 104 107  CO2 21* 24 25  GLUCOSE 96 98 95  BUN 20 18 15   CREATININE 1.04 1.13 0.95  CALCIUM 9.2 9.4 9.1   GFR: Estimated Creatinine Clearance: 76.5 mL/min (by C-G formula based on SCr of 0.95 mg/dL). Liver Function Tests: Recent Labs  Lab 06/04/21 0835 06/06/21 1736  AST 18 19  ALT 18 16  ALKPHOS 92 88  BILITOT 0.4 0.5  PROT 6.9 7.2  ALBUMIN 3.8 4.2   No results for input(s): LIPASE, AMYLASE in the last 168 hours. No results for input(s): AMMONIA in the last 168 hours. Coagulation Profile: Recent Labs  Lab 06/04/21 0835  INR 1.3*   Cardiac Enzymes: No results for input(s): CKTOTAL, CKMB, CKMBINDEX, TROPONINI in the last 168 hours. BNP (last 3 results) No results for input(s): PROBNP in the last 8760 hours. HbA1C: No results for input(s): HGBA1C in the last 72 hours. CBG: No results for input(s): GLUCAP in the last 168 hours. Lipid Profile: No results for input(s): CHOL, HDL, LDLCALC, TRIG, CHOLHDL, LDLDIRECT in the last 72 hours. Thyroid Function Tests: No results for input(s): TSH, T4TOTAL, FREET4, T3FREE, THYROIDAB in the last 72 hours. Anemia Panel: No results for input(s): VITAMINB12, FOLATE, FERRITIN, TIBC, IRON, RETICCTPCT in the last 72 hours.    Radiology Studies: I have reviewed all of the imaging during this hospital visit personally     Scheduled Meds:  atorvastatin  80 mg Oral Daily   Chlorhexidine Gluconate Cloth  6 each Topical Daily   chlorpheniramine-HYDROcodone  5 mL Oral Q12H   diclofenac Sodium  2 g Topical QID   pantoprazole  40 mg Oral Daily   ranolazine  1,000 mg Oral Q1200   rivaroxaban  20 mg Oral Q supper   sodium chloride flush  10-40 mL Intracatheter Q12H    Continuous Infusions:   LOS: 2 days        Creola Krotz Gerome Apley, MD

## 2021-06-09 DIAGNOSIS — I959 Hypotension, unspecified: Secondary | ICD-10-CM

## 2021-06-09 LAB — HEMOGLOBIN AND HEMATOCRIT, BLOOD
HCT: 31.2 % — ABNORMAL LOW (ref 39.0–52.0)
Hemoglobin: 10 g/dL — ABNORMAL LOW (ref 13.0–17.0)

## 2021-06-09 MED ORDER — METOPROLOL SUCCINATE ER 25 MG PO TB24
25.0000 mg | ORAL_TABLET | Freq: Every day | ORAL | Status: DC
  Administered 2021-06-10 – 2021-06-11 (×2): 25 mg via ORAL
  Filled 2021-06-09 (×2): qty 1

## 2021-06-09 MED ORDER — SODIUM CHLORIDE 0.9 % IV BOLUS
500.0000 mL | Freq: Once | INTRAVENOUS | Status: AC
  Administered 2021-06-09: 500 mL via INTRAVENOUS

## 2021-06-09 MED ORDER — MIDODRINE HCL 2.5 MG PO TABS
2.5000 mg | ORAL_TABLET | Freq: Two times a day (BID) | ORAL | Status: DC
  Administered 2021-06-09: 2.5 mg via ORAL
  Filled 2021-06-09 (×2): qty 1

## 2021-06-09 NOTE — Progress Notes (Signed)
Pt mews score turned yellow after completing ordered orthostatic vitals. Provider J. Olena Heckle notified. See orders. Per J. Olena Heckle check vitals Q4 after following MEWS 2.0 guidelines and repeat orthostatics in the morning. Pt asymptomatic upon standing states that he feels well and has no dizziness. Will continue to monitor. Call bell within reach.   06/08/21 2110  Take Vital Signs  Increase Vital Sign Frequency  Yellow: Q 2hr X 2 then Q 4hr X 2, if remains yellow, continue Q 4hrs  Escalate  MEWS: Escalate Yellow: discuss with charge nurse/RN and consider discussing with provider and RRT  Notify: Charge Nurse/RN  Name of Charge Nurse/RN Notified Tom RN  Date Charge Nurse/RN Notified 06/08/21  Time Charge Nurse/RN Notified 2155  Notify: Provider  Provider Name/Title J. Olena Heckle  Date Provider Notified 06/08/21  Time Provider Notified 2254  Notification Type Page  Notification Reason Other (Comment) (Yellow mews /see vitals)  Provider response Other (Comment) (awaiting response)  Date of Provider Response 06/08/21  Time of Provider Response [id number]

## 2021-06-09 NOTE — Care Management Important Message (Signed)
Important Message  Patient Details IM Letter given to the Patient. Name: [name] MRN: [id number] Date of Birth: 1956/01/30   Medicare Important Message Given:  Yes     Kerin Salen 06/09/2021, 1:09 PM

## 2021-06-09 NOTE — Progress Notes (Signed)
PROGRESS NOTE    [name]  GQQ:[id number] DOB: [birth year] DOA: 06/06/2021 PCP: Anthony Melter, MD    Brief Narrative:  [name] was admitted to the hospital with the working diagnosis of hemoptysis in the setting of chronic anticoagulation related to pulmonary embolism.     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  He is s/p right upper lobectomy, brain radiation and chemotherapy.  Last treatment 03/2021. He had chronic pain s/p lobectomy.  On 10/6 patient had hemoptysis, specks of blood in his sputum associate with chest pain.  In the ED he was evaluated with CT angiography which was negative for pulmonary embolism and was discharged home. 48 hours later he had recurrent hemoptysis and chest pain, coughing up to 10-15 red clots the size of a pencil eraser.  No dyspnea.  On his initial physical examination he was tachycardic, 110 bpm, blood pressure 141/82, respiratory rate 18, oxygen saturation 100%, his lungs had no wheezing or rales, heart S1-S2, present, rhythmic, abdomen soft, no lower extremity edema.   Sodium 136, potassium 3.9, chloride 104, bicarb 24, glucose 98, BUN 18, creatinine 1.13, white count 8.7, hemoglobin 10.8, hematocrit 33.3, platelets 187. SARS COVID-19 negative.   CT chest and chest radiograph on 10/6 with small right pleural effusion, no infiltrates.   EKG 116 bpm, normal axis, normal intervals, sinus rhythm, no significant ST segment or T wave changes.  Patient has been placed on antitussive agents and anticoagulation was held. Hemoptysis has resolved but he continue to have right rib cage pain.   Patient with no clinical signs of bronchitis, his Hgb has been stable with no frank acute blood loss anemia.  Tolerating anticoagulation without any further  hemoptysis episodes; assessing patient response to midodrine and checking work-up for orthostatic hypotension prior to discharge.   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  -Follow up CT chest with no PE, has chronic small right pleural effusion.  -Dedicated rib films with no fracture.  -Continue with antitussive agents and analgesics (hydromorphone and oxycodone). -Topical diclofenac to right rib cage tender point.  His hgb continue to be stable at 10.0 -Patient has a high risk for recurrent thromboembolism, considering his history of long cancer with metastasis.  -Continue the use of Xarelto and minimize the use of NSAIDs and also discontinue Plavix.   2. Stage IV large cell neuroendocrine carcinoma,metastatic brain lesion.  -Robotic assisted thoracotomy with right upper lobectomy 11/2020.  -Chemotherapy 03/2021.  -Continue outpatient follow-up with [name].  3. Paroxysmal atrial fibrillation.  -Atrial fibrillation occurred during CT surgery. -Patient has been sinus rhythm with rate control.  -Continue metoprolol and continue the use of Xarelto.   3. CAD sp stenting. Patient had CABG in 2017. DES to RCA in 2019. -On metoprolol, statin therapy and ranexa.  -Will hold on clopidogrel. Old records personally reviewed cardiology office visit on 05/21/21 Ok to stop clopidogrel if patient having bleeding complications.   4. HTN/ dyslipidemia.  -On amlodipine, metoprolol and lisinopril  for blood pressure control as an outpatient; in the setting of orthostatic hypotension physicians lisinopril and amlodipine has  been discontinued. -Continue metoprolol -Continue Lipitor  5.  Orthostatic hypotension -Will check cortisol level -Starting patient on midodrine.   Status is: Inpatient  Remains inpatient appropriate because:Inpatient level of care appropriate due to severity of illness  Dispo: The patient is from: Home              Anticipated d/c is to: Home possible dc home tomorrow if no recurrent bleeding.               Patient currently is not medically stable to d/c.   Difficult to place patient No    DVT prophylaxis: Rivaroxaban   Code Status:    full  Family Communication:   No family at the bedside     Subjective: No nausea, no vomiting, no shortness of breath.  Good saturation on room air.  Continue to progress intermittent pleuritic chest discomfort and also pain in right lower rib cage.  Patient found to be orthostatic hypotensive.  Objective: Vitals:   06/09/21 0930 06/09/21 1442 06/09/21 1443 06/09/21 1445  BP: 133/72 (!) 88/71 (!) 143/85 136/73  Pulse: 84 (!) 115 97 73  Resp: 20 20  18   Temp: 98.1 F (36.7 C) 98.1 F (36.7 C)  98.1 F (36.7 C)  TempSrc: Oral Oral  Oral  SpO2:  99% 100% 99%  Weight:      Height:       No intake or output data in the 24 hours ending 06/09/21 1721 Filed Weights   06/06/21 1618  Weight: 75.8 kg    Examination:  General exam: Alert, awake, oriented x 3; complaining of intermittent pleuritic chest pain and also right lower rib cage area discomfort. No nausea, no vomiting, no further hemoptysis. Respiratory system: Clear to auscultation. Respiratory effort normal.  Good saturation on room air. Cardiovascular system:RRR. No murmurs, rubs, gallops. Gastrointestinal system: Abdomen is nondistended, soft and nontender. No organomegaly or masses felt. Normal bowel sounds heard. Central nervous system: Alert and oriented. No focal neurological deficits. Extremities: No cyanosis or clubbing.  Skin: No rashes, lesions or ulcers Psychiatry:  Judgement and insight appear normal. Mood & affect appropriate.    Data Reviewed: I have personally reviewed following labs and imaging studies  CBC: Recent Labs  Lab 06/04/21 0835 06/06/21 1736 06/07/21 0116 06/07/21 1114 06/07/21 2154 06/08/21 0421 06/09/21 0412  WBC 8.5 8.7  --   --  7.0 7.1  --   NEUTROABS 6.5 6.8  --   --  4.1  --   --   HGB 11.1* 10.8* 9.5* 9.9* 9.9* 9.9* 10.0*  HCT 33.9* 33.3* 29.5* 30.9* 30.6* 31.0* 31.2*  MCV 95.0 96.2  --   --  97.8 97.8  --   PLT 188 187  --   --  184 162  --    Basic Metabolic Panel: Recent Labs  Lab 06/04/21 0835 06/06/21 1736 06/07/21 0116  NA 137 136 138  K 3.6 3.9 3.7  CL 107 104 107  CO2 21* 24 25  GLUCOSE 96 98 95  BUN 20 18 15   CREATININE 1.04 1.13 0.95  CALCIUM 9.2 9.4 9.1   GFR: Estimated Creatinine Clearance: 76.5 mL/min (by C-G formula based on SCr of 0.95 mg/dL).  Liver Function Tests: Recent Labs  Lab 06/04/21 0835 06/06/21 1736  AST 18 19  ALT 18 16  ALKPHOS 92 88  BILITOT 0.4 0.5  PROT 6.9 7.2  ALBUMIN 3.8 4.2   Coagulation Profile: Recent Labs  Lab 06/04/21 0835  INR 1.3*    Radiology Studies: I have reviewed all of the imaging during this hospital visit personally   Scheduled Meds:  atorvastatin  80 mg Oral Daily   Chlorhexidine Gluconate Cloth  6 each Topical Daily   chlorpheniramine-HYDROcodone  5 mL Oral Q12H   diclofenac Sodium  2 g Topical QID   metoprolol succinate  25 mg Oral Daily   midodrine  2.5 mg Oral BID WC   pantoprazole  40 mg Oral Daily   ranolazine  1,000 mg Oral Q1200   rivaroxaban  20 mg Oral Q supper   sodium chloride flush  10-40 mL Intracatheter Q12H   Continuous Infusions:   LOS: 3 days    Barton Dubois, MD [phone]

## 2021-06-09 NOTE — Plan of Care (Signed)

## 2021-06-10 LAB — GLUCOSE, CAPILLARY: Glucose-Capillary: 90 mg/dL (ref 70–99)

## 2021-06-10 LAB — CORTISOL: Cortisol, Plasma: 4 ug/dL

## 2021-06-10 MED ORDER — METHOCARBAMOL 500 MG PO TABS
750.0000 mg | ORAL_TABLET | Freq: Four times a day (QID) | ORAL | Status: DC | PRN
  Administered 2021-06-10 – 2021-06-11 (×2): 750 mg via ORAL
  Filled 2021-06-10 (×2): qty 2

## 2021-06-10 MED ORDER — MIDODRINE HCL 2.5 MG PO TABS
2.5000 mg | ORAL_TABLET | Freq: Three times a day (TID) | ORAL | Status: DC
  Administered 2021-06-10 – 2021-06-11 (×4): 2.5 mg via ORAL
  Filled 2021-06-10 (×5): qty 1

## 2021-06-10 MED ORDER — FERROUS GLUCONATE 324 (38 FE) MG PO TABS
324.0000 mg | ORAL_TABLET | Freq: Two times a day (BID) | ORAL | Status: DC
  Administered 2021-06-10 – 2021-06-11 (×2): 324 mg via ORAL
  Filled 2021-06-10 (×3): qty 1

## 2021-06-10 MED ORDER — SODIUM CHLORIDE 0.9 % IV BOLUS
500.0000 mL | Freq: Once | INTRAVENOUS | Status: AC
  Administered 2021-06-10: 500 mL via INTRAVENOUS

## 2021-06-10 NOTE — Progress Notes (Signed)
PROGRESS NOTE    [name]  WUJ:[id number] DOB: 28-Mar-1956 DOA: 06/06/2021 PCP: [name], MD   Brief Narrative: [name] is a 65 y.o. 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 Patient presented secondary to hemoptysis and chest pain. CTA chest on prior to admission was negative for acute PE but patient had recurrent symptoms prompting admission for observation. Hemoglobin remained stable, but patient developed evidence of orthostatic hypotension.   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 No associated acute anemia. In setting Xarelto use for history of PE and Plavix use for history of CAD s/p stent. Plavix stopped after review of cardiology outpatient office notes indicating recommendation to consider discontinuing in setting of bleeding complications. Hemoptysis resolved.  Right-sided chest pain Appears to be rib pain. Likely related to recent coughing. CT chest on admission without evidence of fracture. Follow-up chest x-ray without evidence of obvious fracture. Reproducible on palpation. No tachycardia at rest and no dyspnea. -Continue analgesics prn  Orthostatic hypotension No prior formal diagnosis, but patient describes a history for the past 9-10 months of having symptoms similar to current presentation. Random cortisol level was normal. -Increase to midodrine 2.5 mg TID -Give 500 mL NS IV bolus -PT/OT consult -Daily orthostatic vital signs -AM cortisol  level  Primary hypertension Patient is on lisinopril, amlodipine and metoprolol succinate as an outpatient. Antihypertensives held at this time with orthostatic vitals.  Paroxysmal atrial fibrillation Patient is on metoprolol succinate and amiodarone (patient listed as not taking) as an outpatient. Metoprolol held this admission secondary to orthostatic vitals.  CAD History of CABG in 2017 and PCI with stent placement in 2019. On Plavix and Ranexa as an outpatient. -Continue Ranexa  Stage IV neuroendocrine tumor of right lung Brain metastasis Patient follows with Dr. [name] as an outpatient. S/p right upper lobectomy, brain radiation and chemotherapy.   DVT prophylaxis: Xarelto Code Status:   Code Status: Full Code Family Communication: None at bedside Disposition Plan: Discharge possibly tomorrow if orthostatic vitals are negative   Consultants:  None  Procedures:  None  Antimicrobials: None    Subjective: Patient reports some right sided chest pain. He reports having intermittent chest pain in the same area but that pain has worsened.   Objective: Vitals:   06/10/21 1052 06/10/21 1053 06/10/21 1121 06/10/21 1222  BP: 115/79 (!) 77/59 109/80 116/75  Pulse: 99 (!) 139 79 68  Resp:    18  Temp:    98.2 F (36.8 C)  TempSrc:    Oral  SpO2: 100%   100%  Weight:      Height:        Intake/Output Summary (Last 24 hours) at 06/10/2021 1512 Last data filed at 06/10/2021 1153 Gross per 24 hour  Intake 250 ml  Output 120 ml  Net 130 ml   Filed Weights   06/06/21 1618  Weight: 75.8 kg    Examination:  General exam: Appears calm and comfortable Respiratory system: Clear to auscultation. Respiratory effort normal. Cardiovascular system: S1 & S2 heard, RRR. No murmurs,  rubs, gallops or clicks. Gastrointestinal system: Abdomen is nondistended, soft and nontender. No organomegaly or masses felt. Normal bowel sounds heard. Central nervous system: Alert and  oriented. No focal neurological deficits. Musculoskeletal: No edema. No calf tenderness. Reproducible right sided rib tenderness Skin: No cyanosis. No rashes Psychiatry: Judgement and insight appear normal. Mood & affect appropriate.     Data Reviewed: I have personally reviewed following labs and imaging studies  CBC Lab Results  Component Value Date   WBC 7.1 06/08/2021   RBC 3.17 (L) 06/08/2021   HGB 10.0 (L) 06/09/2021   HCT 31.2 (L) 06/09/2021   MCV 97.8 06/08/2021   MCH 31.2 06/08/2021   PLT 162 06/08/2021   MCHC 31.9 06/08/2021   RDW 15.0 06/08/2021   LYMPHSABS 1.8 06/07/2021   MONOABS 0.6 06/07/2021   EOSABS 0.5 06/07/2021   BASOSABS 0.0 63/87/5643     Last metabolic panel Lab Results  Component Value Date   NA 138 06/07/2021   K 3.7 06/07/2021   CL 107 06/07/2021   CO2 25 06/07/2021   BUN 15 06/07/2021   CREATININE 0.95 06/07/2021   GLUCOSE 95 06/07/2021   GFRNONAA >60 06/07/2021   GFRAA >60 05/27/2020   CALCIUM 9.1 06/07/2021   PHOS 2.8 04/30/2021   PROT 7.2 06/06/2021   ALBUMIN 4.2 06/06/2021   BILITOT 0.5 06/06/2021   ALKPHOS 88 06/06/2021   AST 19 06/06/2021   ALT 16 06/06/2021   ANIONGAP 6 06/07/2021    CBG (last 3)  Recent Labs    06/10/21 0810  GLUCAP 90     GFR: Estimated Creatinine Clearance: 76.5 mL/min (by C-G formula based on SCr of 0.95 mg/dL).  Coagulation Profile: Recent Labs  Lab 06/04/21 0835  INR 1.3*    Recent Results (from the past 240 hour(s))  Resp Panel by RT-PCR (Flu A&B, Covid) Nasopharyngeal Swab     Status: None   Collection Time: 06/06/21  6:26 PM   Specimen: Nasopharyngeal Swab; Nasopharyngeal(NP) swabs in vial transport medium  Result Value Ref Range Status   SARS Coronavirus 2 by RT PCR NEGATIVE NEGATIVE Final    Comment: (NOTE) SARS-CoV-2 target nucleic acids are NOT DETECTED.  The SARS-CoV-2 RNA is generally detectable in upper respiratory specimens during the acute phase of infection. The  lowest concentration of SARS-CoV-2 viral copies this assay can detect is 138 copies/mL. A negative result does not preclude SARS-Cov-2 infection and should not be used as the sole basis for treatment or other patient management decisions. A negative result may occur with  improper specimen collection/handling, submission of specimen other than nasopharyngeal swab, presence of viral mutation(s) within the areas targeted by this assay, and inadequate number of viral copies(<138 copies/mL). A negative result must be combined with clinical observations, patient history, and epidemiological information. The expected result is Negative.  Fact Sheet for Patients:  EntrepreneurPulse.com.au  Fact Sheet for Healthcare Providers:  IncredibleEmployment.be  This test is no t yet approved or cleared by the [place] FDA and  has been authorized for detection and/or diagnosis of SARS-CoV-2 by FDA under an Emergency Use Authorization (EUA). This EUA will remain  in effect (meaning this test can be used) for the duration of the COVID-19 declaration under Section 564(b)(1) of the Act, 21 U.S.C.section 360bbb-3(b)(1), unless the authorization is terminated  or revoked sooner.       Influenza A by PCR NEGATIVE NEGATIVE Final   Influenza B by PCR NEGATIVE NEGATIVE Final    Comment: (NOTE) The Xpert  Xpress SARS-CoV-2/FLU/RSV plus assay is intended as an aid in the diagnosis of influenza from Nasopharyngeal swab specimens and should not be used as a sole basis for treatment. Nasal washings and aspirates are unacceptable for Xpert Xpress SARS-CoV-2/FLU/RSV testing.  Fact Sheet for Patients: EntrepreneurPulse.com.au  Fact Sheet for Healthcare Providers: IncredibleEmployment.be  This test is not yet approved or cleared by the [place] FDA and has been authorized for detection and/or diagnosis of SARS-CoV-2 by FDA under  an Emergency Use Authorization (EUA). This EUA will remain in effect (meaning this test can be used) for the duration of the COVID-19 declaration under Section 564(b)(1) of the Act, 21 U.S.C. section 360bbb-3(b)(1), unless the authorization is terminated or revoked.  Performed at [facility], [address], [place], [place] [address]         Radiology Studies: No results found.      Scheduled Meds:  atorvastatin  80 mg Oral Daily   Chlorhexidine Gluconate Cloth  6 each Topical Daily   chlorpheniramine-HYDROcodone  5 mL Oral Q12H   diclofenac Sodium  2 g Topical QID   ferrous gluconate  324 mg Oral BID WC   metoprolol succinate  25 mg Oral Daily   midodrine  2.5 mg Oral TID WC   pantoprazole  40 mg Oral Daily   ranolazine  1,000 mg Oral Q1200   rivaroxaban  20 mg Oral Q supper   sodium chloride flush  10-40 mL Intracatheter Q12H   Continuous Infusions:   LOS: 4 days     Cordelia Poche, MD [facility] 06/10/2021, 3:12 PM  If 7PM-7AM, please contact night-coverage [email or web address]

## 2021-06-11 LAB — BASIC METABOLIC PANEL
Anion gap: 7 (ref 5–15)
BUN: 22 mg/dL (ref 8–23)
CO2: 26 mmol/L (ref 22–32)
Calcium: 9.9 mg/dL (ref 8.9–10.3)
Chloride: 109 mmol/L (ref 98–111)
Creatinine, Ser: 1.06 mg/dL (ref 0.61–1.24)
GFR, Estimated: >60 mL/min (ref >60)
Glucose, Bld: 99 mg/dL (ref 70–99)
Potassium: 4.3 mmol/L (ref 3.5–5.1)
Sodium: 142 mmol/L (ref 135–145)

## 2021-06-11 LAB — CBC
HCT: 33 % — ABNORMAL LOW (ref 39.0–52.0)
Hemoglobin: 10.7 g/dL — ABNORMAL LOW (ref 13.0–17.0)
MCH: 31.7 pg (ref 26.0–34.0)
MCHC: 32.4 g/dL (ref 30.0–36.0)
MCV: 97.6 fL (ref 80.0–100.0)
Platelets: 201 10*3/uL (ref 150–400)
RBC: 3.38 MIL/uL — ABNORMAL LOW (ref 4.22–5.81)
RDW: 14.3 % (ref 11.5–15.5)
WBC: 7 10*3/uL (ref 4.0–10.5)
nRBC: 0 % (ref 0.0–0.2)

## 2021-06-11 LAB — CORTISOL-AM, BLOOD: Cortisol - AM: 8.9 ug/dL (ref 6.7–22.6)

## 2021-06-11 LAB — MAGNESIUM: Magnesium: 2 mg/dL (ref 1.7–2.4)

## 2021-06-11 MED ORDER — HEPARIN SOD (PORK) LOCK FLUSH 100 UNIT/ML IV SOLN
500.0000 [IU] | INTRAVENOUS | Status: AC | PRN
  Administered 2021-06-11: 500 [IU]
  Filled 2021-06-11: qty 5

## 2021-06-11 MED ORDER — MIDODRINE HCL 2.5 MG PO TABS: 2.5000 mg | ORAL_TABLET | Freq: Three times a day (TID) | ORAL | 0 refills | Status: DC

## 2021-06-11 MED ORDER — TIZANIDINE HCL 2 MG PO TABS: 2.0000 mg | ORAL_TABLET | Freq: Three times a day (TID) | ORAL | 0 refills | Status: DC | PRN

## 2021-06-11 MED ORDER — FERROUS GLUCONATE 324 (38 FE) MG PO TABS: 324.0000 mg | ORAL_TABLET | Freq: Every day | ORAL | 2 refills | Status: DC

## 2021-06-11 NOTE — Progress Notes (Signed)
OT Cancellation Note  Patient Details Name: [name] MRN: [id number] DOB: [birth year]   Cancelled Treatment:    Reason Eval/Treat Not Completed: OT screened, no needs identified, will sign off. Per PT patient independent with ADLs and ambulation. OT evaluation not indicated.  Dalten Ambrosino L Samuele Storey 06/11/2021, 2:55 PM

## 2021-06-11 NOTE — Discharge Instructions (Addendum)
[name],  You were in the hospital because of coughing up blood. Thankfully this was not much blood (your blood counts remained stable). This was likely worsened by your Plavix and Xarelto combination. Your Plavix has been discontinued. While you were here, you were also found to have low blood pressure while standing up (orthostatic hypotension). This has been improved with midodrine; this medication helps your blood pressure stay up when you stand up. I would like for you to follow-up with your cardiologist about your orthostatic hypotension as I have had to discontinue most of your blood pressure medications.

## 2021-06-11 NOTE — Evaluation (Signed)
Physical Therapy Evaluation Patient Details Name: [name] MRN: [id number] DOB: 01-May-1956 Today's Date: 06/11/2021  History of Present Illness  65 y.o. male  presented secondary to hemoptysis and chest pain.  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  Clinical Impression  Pt ambulated 220' without an assistive device, no loss of balance, HR 120-127 while walking, SaO2 99% on room air. Pt reports feeling deconditioned from months of cancer treatments. Encouraged him to walk and perform gentle strengthening exercises with rest periods between bouts of activity. Instructed pt in seated BLE strengthening exercises.        Recommendations for follow up therapy are one component of a multi-disciplinary discharge planning process, led by the attending physician.  Recommendations may be updated based on patient status, additional functional criteria and insurance authorization.  Follow Up Recommendations No PT follow up    Equipment Recommendations  None recommended by PT    Recommendations for Other Services       Precautions / Restrictions Precautions Precautions: None Restrictions Weight Bearing Restrictions: No      Mobility  Bed Mobility Overal bed mobility: Independent                  Transfers Overall transfer level: Independent                  Ambulation/Gait Ambulation/Gait assistance: Independent Gait Distance (Feet): 220 Feet Assistive device: None Gait Pattern/deviations: WFL(Within Functional Limits) Gait velocity: WNL   General Gait Details: HR 127 max walking, 120 when walking at slower speed, SaO2 99% on room air  Stairs            Wheelchair Mobility    Modified Rankin (Stroke Patients Only)       Balance Overall balance  assessment: Independent                                           Pertinent Vitals/Pain Pain Assessment: No/denies pain    Home Living Family/patient expects to be discharged to:: Private residence Living Arrangements: Spouse/significant other Available Help at Discharge: Family;Available PRN/intermittently Type of Home: House Home Access: Stairs to enter Entrance Stairs-Rails: Psychiatric nurse of Steps: 6 Home Layout: Two level;Bed/bath upstairs Home Equipment: None      Prior Function Level of Independence: Independent               Hand Dominance   Dominant Hand: Left    Extremity/Trunk Assessment   Upper Extremity Assessment Upper Extremity Assessment: Overall WFL for tasks assessed    Lower Extremity Assessment Lower Extremity Assessment: Overall WFL for tasks assessed (reports mild tingling in feet at baseline)    Cervical / Trunk Assessment Cervical / Trunk Assessment: Normal  Communication   Communication: No difficulties  Cognition Arousal/Alertness: Awake/alert Behavior During Therapy: WFL for tasks assessed/performed Overall Cognitive Status: Within Functional Limits for tasks assessed                                        General Comments      Exercises     Assessment/Plan    PT Assessment Patent does not  need any further PT services  PT Problem List         PT Treatment Interventions      PT Goals (Current goals can be found in the Care Plan section)  Acute Rehab PT Goals Patient Stated Goal: to get stronger PT Goal Formulation: All assessment and education complete, DC therapy    Frequency     Barriers to discharge        Co-evaluation               AM-PAC PT "6 Clicks" Mobility  Outcome Measure Help needed turning from your back to your side while in a flat bed without using bedrails?: None Help needed moving from lying on your back to sitting on the side of a  flat bed without using bedrails?: None Help needed moving to and from a bed to a chair (including a wheelchair)?: None Help needed standing up from a chair using your arms (e.g., wheelchair or bedside chair)?: None Help needed to walk in hospital room?: None Help needed climbing 3-5 steps with a railing? : None 6 Click Score: 24    End of Session   Activity Tolerance: Patient tolerated treatment well Patient left: in bed Nurse Communication: Mobility status      Time: 8502-7741 PT Time Calculation (min) (ACUTE ONLY): 25 min   Charges:   PT Evaluation $PT Eval Low Complexity: 1 Low PT Treatments $Gait Training: 8-22 mins       Blondell Reveal Kistler PT 06/11/2021  Acute Rehabilitation Services Pager [phone] Office [phone]

## 2021-06-11 NOTE — Discharge Summary (Signed)
Physician Discharge Summary  [name] ZOX:[id number] DOB: [birth year] DOA: 06/06/2021  PCP: [name], MD  Admit date: 06/06/2021 Discharge date: 06/11/2021  Admitted From: Home Disposition: Home  Recommendations for Outpatient Follow-up:  Follow up with PCP in 1 week Follow up with cardiology in 2-4 weeks for management of orthostatic hypotension Please obtain BMP/CBC in one week Please follow up on the following pending results: None  Home Health: None Equipment/Devices: None  Discharge Condition: Stable CODE STATUS: Full code Diet recommendation: Heart healthy   Brief/Interim Summary:  Admission HPI written by Mitzi Hansen, MD   HPI: ZARIN KNUPP is a 65 y.o. 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  Patient reports that he has had intermittent pain in the right lower anterolateral chest since his lobectomy in April, was experiencing a bout of that pain on 06/04/2021, had specks of blood in his sputum at that time, and sought evaluation in the emergency department where he had a CTA chest with minimal right pleural effusion and no PE.  There was no pain or hemoptysis the following day, but he then had recurrent pain and hemoptysis today, coughing up 10-15 red globs the size of a pencil eraser.  Denies any new dyspnea, denies fevers or chills, and denies any leg swelling or orthopnea.  He took his Plavix and Xarelto today at approximately 1 PM.    Hospital course:  Hemoptysis Low volume. No associated acute anemia. In setting Xarelto use for history of PE and Plavix use for history of CAD s/p stent. Plavix stopped after review of  cardiology outpatient office notes indicating recommendation to consider discontinuing in setting of bleeding complications. Hemoptysis resolved.   Right-sided chest pain Appears to be rib pain. Likely related to recent coughing. CT chest on admission without evidence of fracture. Follow-up chest x-ray without evidence of obvious fracture. Reproducible on palpation. No tachycardia at rest and no dyspnea.    Orthostatic hypotension No prior formal diagnosis, but patient describes a history for the past 9-10 months of having symptoms similar to current presentation. Random and morning cortisol levels were normal. Midodrine 2.5 mg three times daily initiated with improvement of orthostatic vitals. Metoprolol restarted to control excessive tachycardia while standing with improvement. Discharge on midodrine and recommend cardiology follow-up.   History of PE Continue Xarelto   Primary hypertension Patient is on lisinopril, amlodipine and metoprolol succinate as an outpatient. Antihypertensives held secondary to orthostatic vitals. Lisinopril and amlodipine discontinued on discharge.   Paroxysmal atrial fibrillation Patient is on metoprolol succinate and amiodarone (patient listed as not taking) as an outpatient. Metoprolol held this admission secondary to orthostatic vitals but eventually restarted to help with standing tachycardia. Continue on discharge.   CAD History of CABG in 2017 and PCI with stent placement in 2019. On Plavix and Ranexa as an outpatient. Continue Ranexa. Plavix discontinued.   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 [name] as an outpatient. S/p right upper lobectomy, brain radiation and chemotherapy.  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)   AF (paroxysmal atrial fibrillation) (HCC)   Pleural effusion   History of pulmonary embolus (PE)    Discharge Instructions   Allergies as of 06/11/2021       Reactions   Prednisone Other (See Comments)   States that this med makes him "crazy"   Tetanus Toxoids Swelling, Other (See Comments)   Fever, Swelling of the arm    Wellbutrin [bupropion] Other (See Comments)   Crazy thoughts, nightmares   Indomethacin Other (See Comments)   rectal bleeding   Other    Other reaction(s): Unknown   Varenicline Other (See Comments)   Dreams Other reaction(s): Unknown Other reaction(s): Unknown        Medication List     STOP taking these medications    amiodarone 200 MG tablet Commonly known as: PACERONE   amLODipine 10 MG tablet Commonly known as: NORVASC   cloNIDine 0.1 MG tablet Commonly known as: CATAPRES   clopidogrel 75 MG tablet Commonly known as: PLAVIX   lisinopril 5 MG tablet Commonly known as: ZESTRIL   oxyCODONE 5 MG immediate release tablet Commonly known as: Roxicodone       TAKE these medications    acetaminophen 500 MG tablet Commonly known as: TYLENOL Take 1,000 mg by mouth every 6 (six) hours as needed for moderate pain.   atorvastatin 80 MG tablet Commonly known as: LIPITOR TAKE 1 TABLET(80 MG) BY MOUTH DAILY   ferrous gluconate 324 MG tablet Commonly known as: FERGON Take 1 tablet (324 mg total) by mouth daily with breakfast.   lidocaine-prilocaine cream Commonly known as: EMLA Apply 1 application topically once as needed (port access).   magnesium oxide 400 (240 Mg) MG tablet Commonly known as: MAG-OX Take 1 tablet (400 mg total) by mouth daily.   metoprolol succinate 25 MG 24 hr tablet Commonly known as: TOPROL-XL Take 1 tablet (25 mg total) by mouth daily.   midodrine 2.5 MG tablet Commonly known as: PROAMATINE Take 1 tablet (2.5 mg total) by mouth 3 (three) times daily with meals.   Mucus Relief DM  30-600 MG Tb12 Take 1 tablet by mouth 2 (two) times daily.   nitroGLYCERIN 0.4 MG SL tablet Commonly known as: NITROSTAT PLACE 1 TABLET UNDER THE TONGUE EVERY 5 MINUTES AS NEEDED FOR CHEST PAIN. 3 DOSES MAX   ondansetron 8 MG tablet Commonly known as: ZOFRAN Take 1 tablet (8 mg total) by mouth every 8 (eight) hours as needed for nausea or vomiting. Starting 3 days after chemotherapy   pantoprazole 40 MG tablet Commonly known as: Protonix Take 1 tablet (40 mg total) by mouth daily.   prochlorperazine 10 MG tablet Commonly known as: COMPAZINE Take 1 tablet (10 mg total) by mouth every 6 (six) hours as needed.   ranolazine 1000 MG SR tablet Commonly known as: RANEXA Take 1 tablet (1,000 mg total) by mouth 2 (two) times daily. What changed: when to take this   Stiolto Respimat 2.5-2.5 MCG/ACT Aers Generic drug: Tiotropium Bromide-Olodaterol Inhale 2 puffs into the lungs daily.   tiZANidine 2 MG tablet Commonly known as: ZANAFLEX Take 1 tablet (2 mg total) by mouth every 8 (eight) hours as needed for muscle spasms.   Xarelto 20 MG Tabs tablet Generic drug: rivaroxaban Take 20 mg by mouth daily. What changed: Another medication with the same name was removed. Continue taking this medication, and follow the directions you see here.        Follow-up Information  [name], MD. Schedule an appointment as soon as possible for a visit in 1 week(s).   Specialty: Family Medicine Why: For hospital follow-up Contact information: [address] [place] [address] [phone]         [name], MD Follow up in 2 week(s).   Specialties: Cardiology, Radiology, Interventional Cardiology Why: Orthostatic hypotension. Contact information: [address] [place] [address] [phone]                Allergies  Allergen Reactions   Prednisone Other (See Comments)    States that this med makes him "crazy"   Tetanus  Toxoids Swelling and Other (See Comments)    Fever, Swelling of the arm    Wellbutrin [Bupropion] Other (See Comments)    Crazy thoughts, nightmares   Indomethacin Other (See Comments)    rectal bleeding   Other     Other reaction(s): Unknown   Varenicline Other (See Comments)    Dreams Other reaction(s): Unknown Other reaction(s): Unknown    Consultations: None   Procedures/Studies: DG Ribs Unilateral W/Chest Right  Result Date: 06/06/2021 CLINICAL DATA:  right sided rib pain EXAM: RIGHT RIBS AND CHEST - 3+ VIEW COMPARISON:  Chest x-ray 06/04/2021, CT chest 06/04/2021 FINDINGS: Right chest wall Port-A-Cath with tip overlying right atrium just distal to the superior cavoatrial junction. The heart and mediastinal contours are unchanged. Aortic calcification. Surgical hardware overlies the mediastinum. No focal consolidation. No pulmonary edema. Persistent, possibly slightly increased in size, trace volume right pleural effusion. No pneumothorax. Punctate round metallic BB marker noted overlying the right eleventh rib. No acute displaced right fracture or other bone lesions are seen involving the ribs. No acute osseous abnormality. Multilevel degenerative changes of the spine. IMPRESSION: 1. No acute displaced right rib fracture. Please note, nondisplaced rib fractures may be occult on radiograph. 2. Persistent, possibly slightly increased in size, trace volume right pleural effusion. Electronically Signed   By: Iven Finn M.D.   On: 06/06/2021 16:52   CT Angio Chest PE W/Cm &/Or Wo Cm  Result Date: 06/04/2021 CLINICAL DATA:  Cough. EXAM: CT ANGIOGRAPHY CHEST WITH CONTRAST TECHNIQUE: Multidetector CT imaging of the chest was performed using the standard protocol during bolus administration of intravenous contrast. Multiplanar CT image reconstructions and MIPs were obtained to evaluate the vascular anatomy. CONTRAST:  143mL OMNIPAQUE IOHEXOL 350 MG/ML SOLN COMPARISON:  April 27, 2021.  FINDINGS: Cardiovascular: Satisfactory opacification of the pulmonary arteries to the segmental level. No evidence of pulmonary embolism. Normal heart size. No pericardial effusion. Atherosclerosis of thoracic aorta is noted without aneurysm formation. Status post coronary bypass graft. Mediastinum/Nodes: No enlarged mediastinal, hilar, or axillary lymph nodes. Thyroid gland, trachea, and esophagus demonstrate no significant findings. Lungs/Pleura: No pneumothorax is noted. Left lung is clear. Stable right upper lobe scarring is noted. Minimal right pleural effusion is noted with adjacent subsegmental atelectasis. Upper Abdomen: No acute abnormality. Musculoskeletal: No chest wall abnormality. No acute or significant osseous findings. Review of the MIP images confirms the above findings. IMPRESSION: No definite evidence of pulmonary embolus. Minimal right pleural effusion is noted with minimal adjacent subsegmental atelectasis. Aortic Atherosclerosis (ICD10-I70.0). Electronically Signed   By: Marijo Conception M.D.   On: 06/04/2021 10:01   MR Brain W Wo Contrast  Result Date: 05/15/2021 CLINICAL DATA:  Metastatic disease follow-up EXAM: MRI HEAD WITHOUT AND WITH CONTRAST TECHNIQUE: Multiplanar, multiecho pulse sequences of the brain and surrounding structures were obtained without and  with intravenous contrast. CONTRAST:  28mL MULTIHANCE GADOBENATE DIMEGLUMINE 529 MG/ML IV SOLN COMPARISON:  02/04/2021. FINDINGS: Brain: Previously noted enhancing lesion in the posterior left frontal lobe is no longer seen (series 13, image 140). Significant interval decrease in the size of the left cerebellar lesion, now measuring approximately 2 mm (series 13, image 60), previously 7 mm. Decreased vasogenic edema around the left cerebellar lesion. No new enhancing lesions. No acute infarct, cerebral edema, hemorrhage, mass effect, or midline shift. No hydrocephalus. Vascular: Normal flow voids. Skull and upper cervical spine:  Normal marrow signal. Sinuses/Orbits: Negative. Other: None. IMPRESSION: 1. 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 2. Previously noted enhancing lesion in the left frontal lobe is no longer seen. 3. No new enhancing lesions to suggest additional metastatic disease. Electronically Signed   By: Merilyn Baba M.D.   On: 05/15/2021 23:23   DG Chest Port 1 View  Result Date: 06/04/2021 CLINICAL DATA:  65 year old male with lung cancer. Coughing up blood for 1 week. Rib pain. Diarrhea. Denies fever. EXAM: PORTABLE CHEST 1 VIEW COMPARISON:  CT Abdomen and Pelvis 05/05/2021 and earlier. FINDINGS: Portable AP upright view at 0751 hours. Chronic right pleural effusion with pleural thickening as seen by CT last month is stable. Stable right chest power port. Stable lung volumes and mediastinal contours. No pneumothorax, pulmonary edema or new pulmonary opacity. Prior CABG. Prior ACDF. Stable visualized osseous structures. IMPRESSION: Stable chronic right pleural effusion. No new cardiopulmonary abnormality. Electronically Signed   By: Genevie Ann M.D.   On: 06/04/2021 08:38      Subjective: No issues overnight. No symptoms with orthostatic vitals.  Discharge Exam: Vitals:   06/11/21 1311 06/11/21 1313  BP: 110/86 (!) 132/92  Pulse: (!) 114 82  Resp:    Temp:    SpO2: 100% 100%   Vitals:   06/11/21 1306 06/11/21 1307 06/11/21 1311 06/11/21 1313  BP: 120/76 96/73 110/86 (!) 132/92  Pulse: 81 (!) 116 (!) 114 82  Resp:      Temp:      TempSrc:      SpO2: 100% 100% 100% 100%  Weight:      Height:        General: Pt is alert, awake, not in acute distress Cardiovascular: RRR, S1/S2 +, no rubs, no gallops Respiratory: CTA bilaterally, no wheezing, no rhonchi Abdominal: Soft, NT, ND, bowel sounds + Extremities: no edema, no cyanosis    The results of significant diagnostics from this hospitalization (including imaging, microbiology, ancillary  and laboratory) are listed below for reference.     Microbiology: Recent Results (from the past 240 hour(s))  Resp Panel by RT-PCR (Flu A&B, Covid) Nasopharyngeal Swab     Status: None   Collection Time: 06/06/21  6:26 PM   Specimen: Nasopharyngeal Swab; Nasopharyngeal(NP) swabs in vial transport medium  Result Value Ref Range Status   SARS Coronavirus 2 by RT PCR NEGATIVE NEGATIVE Final    Comment: (NOTE) SARS-CoV-2 target nucleic acids are NOT DETECTED.  The SARS-CoV-2 RNA is generally detectable in upper respiratory specimens during the acute phase of infection. The lowest concentration of SARS-CoV-2 viral copies this assay can detect is 138 copies/mL. A negative result does not preclude SARS-Cov-2 infection and should not be used as the sole basis for treatment or other patient management decisions. A negative result may occur with  improper specimen collection/handling, submission of specimen other than nasopharyngeal swab, presence of viral mutation(s) within the areas  targeted by this assay, and inadequate number of viral copies(<138 copies/mL). A negative result must be combined with clinical observations, patient history, and epidemiological information. The expected result is Negative.  Fact Sheet for Patients:  EntrepreneurPulse.com.au  Fact Sheet for Healthcare Providers:  IncredibleEmployment.be  This test is no t yet approved or cleared by the [place] FDA and  has been authorized for detection and/or diagnosis of SARS-CoV-2 by FDA under an Emergency Use Authorization (EUA). This EUA will remain  in effect (meaning this test can be used) for the duration of the COVID-19 declaration under Section 564(b)(1) of the Act, 21 U.S.C.section 360bbb-3(b)(1), unless the authorization is terminated  or revoked sooner.       Influenza A by PCR NEGATIVE NEGATIVE Final   Influenza B by PCR NEGATIVE NEGATIVE Final    Comment:  (NOTE) The Xpert Xpress SARS-CoV-2/FLU/RSV plus assay is intended as an aid in the diagnosis of influenza from Nasopharyngeal swab specimens and should not be used as a sole basis for treatment. Nasal washings and aspirates are unacceptable for Xpert Xpress SARS-CoV-2/FLU/RSV testing.  Fact Sheet for Patients: EntrepreneurPulse.com.au  Fact Sheet for Healthcare Providers: IncredibleEmployment.be  This test is not yet approved or cleared by the [place] FDA and has been authorized for detection and/or diagnosis of SARS-CoV-2 by FDA under an Emergency Use Authorization (EUA). This EUA will remain in effect (meaning this test can be used) for the duration of the COVID-19 declaration under Section 564(b)(1) of the Act, 21 U.S.C. section 360bbb-3(b)(1), unless the authorization is terminated or revoked.  Performed at [facility], [address] [place], [place] [address]      Labs: BNP (last 3 results) Recent Labs    04/28/21 1751 06/04/21 0835 06/06/21 1736  BNP 95.2 53.0 41.7   Basic Metabolic Panel: Recent Labs  Lab 06/06/21 1736 06/07/21 0116 06/11/21 0605  NA 136 138 142  K 3.9 3.7 4.3  CL 104 107 109  CO2 24 25 26   GLUCOSE 98 95 99  BUN 18 15 22   CREATININE 1.13 0.95 1.06  CALCIUM 9.4 9.1 9.9  MG  --   --  2.0   Liver Function Tests: Recent Labs  Lab 06/06/21 1736  AST 19  ALT 16  ALKPHOS 88  BILITOT 0.5  PROT 7.2  ALBUMIN 4.2   No results for input(s): LIPASE, AMYLASE in the last 168 hours. No results for input(s): AMMONIA in the last 168 hours. CBC: Recent Labs  Lab 06/06/21 1736 06/07/21 0116 06/07/21 1114 06/07/21 2154 06/08/21 0421 06/09/21 0412 06/11/21 0605  WBC 8.7  --   --  7.0 7.1  --  7.0  NEUTROABS 6.8  --   --  4.1  --   --   --   HGB 10.8*   < > 9.9* 9.9* 9.9* 10.0* 10.7*  HCT 33.3*   < > 30.9* 30.6* 31.0* 31.2* 33.0*  MCV 96.2  --   --  97.8 97.8  --  97.6  PLT 187  --    --  184 162  --  201   < > = values in this interval not displayed.   Cardiac Enzymes: No results for input(s): CKTOTAL, CKMB, CKMBINDEX, TROPONINI in the last 168 hours. BNP: Invalid input(s): POCBNP CBG: Recent Labs  Lab 06/10/21 0810  GLUCAP 90   D-Dimer No results for input(s): DDIMER in the last 72 hours. Hgb A1c No results for input(s): HGBA1C in the last 72 hours.  Lipid Profile No results for input(s): CHOL, HDL, LDLCALC, TRIG, CHOLHDL, LDLDIRECT in the last 72 hours. Thyroid function studies No results for input(s): TSH, T4TOTAL, T3FREE, THYROIDAB in the last 72 hours.  Invalid input(s): FREET3 Anemia work up No results for input(s): VITAMINB12, FOLATE, FERRITIN, TIBC, IRON, RETICCTPCT in the last 72 hours. Urinalysis    Component Value Date/Time   COLORURINE YELLOW 05/05/2021 0508   APPEARANCEUR CLEAR 05/05/2021 0508   LABSPEC 1.010 05/05/2021 0508   PHURINE 6.0 05/05/2021 0508   GLUCOSEU NEGATIVE 05/05/2021 0508   HGBUR NEGATIVE 05/05/2021 0508   BILIRUBINUR NEGATIVE 05/05/2021 0508   KETONESUR NEGATIVE 05/05/2021 0508   PROTEINUR NEGATIVE 05/05/2021 0508   UROBILINOGEN 0.2 05/13/2014 0658   NITRITE NEGATIVE 05/05/2021 0508   LEUKOCYTESUR NEGATIVE 05/05/2021 0508   Sepsis Labs Invalid input(s): PROCALCITONIN,  WBC,  LACTICIDVEN Microbiology Recent Results (from the past 240 hour(s))  Resp Panel by RT-PCR (Flu A&B, Covid) Nasopharyngeal Swab     Status: None   Collection Time: 06/06/21  6:26 PM   Specimen: Nasopharyngeal Swab; Nasopharyngeal(NP) swabs in vial transport medium  Result Value Ref Range Status   SARS Coronavirus 2 by RT PCR NEGATIVE NEGATIVE Final    Comment: (NOTE) SARS-CoV-2 target nucleic acids are NOT DETECTED.  The SARS-CoV-2 RNA is generally detectable in upper respiratory specimens during the acute phase of infection. The lowest concentration of SARS-CoV-2 viral copies this assay can detect is 138 copies/mL. A negative result does  not preclude SARS-Cov-2 infection and should not be used as the sole basis for treatment or other patient management decisions. A negative result may occur with  improper specimen collection/handling, submission of specimen other than nasopharyngeal swab, presence of viral mutation(s) within the areas targeted by this assay, and inadequate number of viral copies(<138 copies/mL). A negative result must be combined with clinical observations, patient history, and epidemiological information. The expected result is Negative.  Fact Sheet for Patients:  EntrepreneurPulse.com.au  Fact Sheet for Healthcare Providers:  IncredibleEmployment.be  This test is no t yet approved or cleared by the [place] FDA and  has been authorized for detection and/or diagnosis of SARS-CoV-2 by FDA under an Emergency Use Authorization (EUA). This EUA will remain  in effect (meaning this test can be used) for the duration of the COVID-19 declaration under Section 564(b)(1) of the Act, 21 U.S.C.section 360bbb-3(b)(1), unless the authorization is terminated  or revoked sooner.       Influenza A by PCR NEGATIVE NEGATIVE Final   Influenza B by PCR NEGATIVE NEGATIVE Final    Comment: (NOTE) The Xpert Xpress SARS-CoV-2/FLU/RSV plus assay is intended as an aid in the diagnosis of influenza from Nasopharyngeal swab specimens and should not be used as a sole basis for treatment. Nasal washings and aspirates are unacceptable for Xpert Xpress SARS-CoV-2/FLU/RSV testing.  Fact Sheet for Patients: EntrepreneurPulse.com.au  Fact Sheet for Healthcare Providers: IncredibleEmployment.be  This test is not yet approved or cleared by the [place] FDA and has been authorized for detection and/or diagnosis of SARS-CoV-2 by FDA under an Emergency Use Authorization (EUA). This EUA will remain in effect (meaning this test can be used) for the  duration of the COVID-19 declaration under Section 564(b)(1) of the Act, 21 U.S.C. section 360bbb-3(b)(1), unless the authorization is terminated or revoked.  Performed at [facility], [address], [place], [place] [address]      Time coordinating discharge: 35 minutes  SIGNED:   Cordelia Poche, MD [facility] 06/11/2021,  1:46 PM

## 2021-06-12 ENCOUNTER — Telehealth: Payer: Self-pay | Admitting: Interventional Cardiology

## 2021-06-12 ENCOUNTER — Telehealth: Payer: Self-pay | Admitting: Medical Oncology

## 2021-06-12 NOTE — Telephone Encounter (Signed)
Left message for patient to call back  

## 2021-06-12 NOTE — Telephone Encounter (Signed)
Spoke with patient.  He was dc'd from hospital yesterday afternoon.  Started midodrine TID for orthostatic hypotension and has concerns now w HR resting 54.    He had been having HRs 140s while standing.  This has improved.  I reviewed w PharmD who adv slow HR can be a side effect but that 54 is not significant.   He has a follow up with APP on 06/15/21 but wanted to make Dr. Irish Lack aware that lisinopril, Plavix and amlodipine were dc'd.   Ferrous gluconate was added.   He has h/o afib, CABG, PE and is on Xarelto.    Reviewed with Dr. Irish Lack who recommends no changes for now.  Pt pleased to hear this.  Will monitor for any symptoms over the weekend and report to APP at appointment Monday.  Reassurance provided for reting HR of 54.  Pt appreciative for information provided.

## 2021-06-12 NOTE — Telephone Encounter (Signed)
Asking if orthostatic hypotension is common in cancer pt?   Discussed with pt  the various reasons for hypotension and to take his new rx midodrine as instructed. He asked lots of questions. He was in no acute distress.  Symptoms he is experiencing. Lightheaded when he gets up. Breathlessness, mild headaches.   chest discomfort around ribs - CXR did not show any displaced fractures .  Zanaflex was prescribed for muscle spasms- he takes it daily ,but also said " it does not help".  Pt instructions -  hold Zanaflex and see if lightheadedness gets better. - take Tylenol for chest discomfort. - keep appt with his cardiologist on Monday. -If symptoms worse he needs to go to ED.

## 2021-06-12 NOTE — Telephone Encounter (Signed)
Pt c/o medication issue:  1. Name of Medication: midodrine (PROAMATINE) 2.5 MG tablet  2. How are you currently taking this medication (dosage and times per day)? Take 1 tablet (2.5 mg total) by mouth 3 (three) times daily with meals.  3. Are you having a reaction (difficulty breathing--STAT)? NO  4. What is your medication issue? PT FEELS LIKE THIS MEDICINE IS DROPPING HIS PULSE WHEN HE IS AT REST TO 54  pT WAS ADVISED BY [facility] STAFF TO STOP TAKING: AMLODOPINE, PLAVIX AND LICINIPRIL  THE FOLLOWING MEDS WERE ADDED: ferrous gluconate (FERGON) 324 MG tablet  PT HAS AN UPCOMING APPT WITH CAITLIN WALKER 06/15/21

## 2021-06-15 ENCOUNTER — Ambulatory Visit (INDEPENDENT_AMBULATORY_CARE_PROVIDER_SITE_OTHER): Payer: Medicare Other

## 2021-06-15 ENCOUNTER — Ambulatory Visit (INDEPENDENT_AMBULATORY_CARE_PROVIDER_SITE_OTHER): Payer: Medicare Other | Admitting: Family

## 2021-06-15 ENCOUNTER — Encounter (HOSPITAL_BASED_OUTPATIENT_CLINIC_OR_DEPARTMENT_OTHER): Payer: Self-pay | Admitting: Family

## 2021-06-15 ENCOUNTER — Telehealth: Payer: Self-pay | Admitting: Family

## 2021-06-15 ENCOUNTER — Other Ambulatory Visit: Payer: Self-pay

## 2021-06-15 VITALS — BP 124/78 | HR 74 | Ht 69.0 in | Wt 171.7 lb

## 2021-06-15 DIAGNOSIS — R000 Tachycardia, unspecified: Secondary | ICD-10-CM

## 2021-06-15 DIAGNOSIS — I951 Orthostatic hypotension: Secondary | ICD-10-CM

## 2021-06-15 DIAGNOSIS — G453 Amaurosis fugax: Secondary | ICD-10-CM

## 2021-06-15 DIAGNOSIS — R42 Dizziness and giddiness: Secondary | ICD-10-CM

## 2021-06-15 DIAGNOSIS — I6523 Occlusion and stenosis of bilateral carotid arteries: Secondary | ICD-10-CM | POA: Diagnosis not present

## 2021-06-15 MED ORDER — MIDODRINE HCL 2.5 MG PO TABS: 2.5000 mg | ORAL_TABLET | Freq: Three times a day (TID) | ORAL | 5 refills | Status: DC

## 2021-06-15 NOTE — Telephone Encounter (Signed)
Patient called and said he found the book in a different pair of pants

## 2021-06-15 NOTE — Telephone Encounter (Signed)
[name] place a monitor on him today. Patient misplaced the booklet to write down his symptoms and sticker to seal box.  He wants to know if he can come by to pick up another one.

## 2021-06-15 NOTE — Progress Notes (Signed)
Office Visit    Patient Name: [name] Date of Encounter: 06/15/2021  PCP:  [name], MD   [place]  Cardiologist:  Larae Grooms, MD  Advanced Practice Provider:  No care team member to display Electrophysiologist:  None      Chief Complaint    [name] is a 65 y.o. 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 10/2015 ant STEMI >> LHC with 3 v CAD; oLAD tx with POBA >> emergent CABG. b. Multiple evals since that time, early graft failure of SVG-RCA by cath 03/2016. c. 2/19 PCI/DES x1 to pRCA, normal EF.   Carotid artery disease ([place])    a. 40-59% BICA 02/2018.   Depression    Dyspnea    Ectopic atrial tachycardia (HCC)    Esophageal reflux    eosinophil esophagitis   Family history of adverse reaction to anesthesia    "sister has PONV" (06/21/2017)   Former tobacco use    Gout    Hepatitis C    "treated and cured" (06/21/2017)   High cholesterol    History of kidney stones    Hypertension    Ischemic cardiomyopathy    a. EF 25-30% at intraop TEE 4/17  //  b. Limited Echo 5/17 - EF 45-50%, mild ant HK. c. EF 55-65% by cath 09/2017.   Migraine    "3-4/yr" (06/21/2017)   Myocardial infarction ([place]) 10/2015   Palpitations    Sinus bradycardia    a. HR dropping into 40s in 02/2016 -> BB reduced.   Stroke [facility]) 10/2016   "small one; sometimes my memory/cognitive issues" (06/21/2017)   Symptomatic hypotension    a. 02/2016 ER visit -> meds reduced.   Syncope    Wears  dentures    Wears glasses    Past Surgical History:  Procedure Laterality Date   25 GAUGE PARS PLANA VITRECTOMY WITH 20 GAUGE MVR PORT  12/31/2020   Procedure: 25 GAUGE PARS PLANA VITRECTOMY WITH 20 GAUGE MVR PORT;  Surgeon: [name], MD;  Location: [place];  Service: Thoracic;;   ANTERIOR CERVICAL DECOMP/DISCECTOMY FUSION N/A 10/17/2018   Procedure: Anterior Cervical Decompression Fusion - Cervical seven -Thoracic one;  Surgeon: Consuella Lose, MD;  Location: [place];  Service: Neurosurgery;  Laterality: N/A;   BASAL CELL CARCINOMA EXCISION     "forehead   BIOPSY  07/20/2019   Procedure: BIOPSY;  Surgeon: Carol Ada, MD;  Location: [facility];  Service: Endoscopy;;   BRONCHIAL BIOPSY  12/24/2020   Procedure: BRONCHIAL BIOPSIES;  Surgeon: Garner Nash, DO;  Location: [place] [facility];  Service: Pulmonary;;   BRONCHIAL BRUSHINGS  12/24/2020   Procedure: BRONCHIAL BRUSHINGS;  Surgeon: Garner Nash, DO;  Location: [place];  Service: Pulmonary;;   BRONCHIAL NEEDLE ASPIRATION BIOPSY  12/24/2020   Procedure: BRONCHIAL NEEDLE ASPIRATION BIOPSIES;  Surgeon: Garner Nash, DO;  Location: [place];  Service: Pulmonary;;   CARDIAC CATHETERIZATION N/A 11/28/2015   Procedure: Left Heart Cath and Coronary  Angiography;  Surgeon: [name], MD;  Location: [place] [facility];  Service: Cardiovascular;  Laterality: N/A;   CARDIAC CATHETERIZATION N/A 11/28/2015   Procedure: Coronary Balloon Angioplasty;  Surgeon: [name], MD;  Location: [place] [facility];  Service: Cardiovascular;  Laterality: N/A;  ostial LAD   CARDIAC CATHETERIZATION N/A 11/28/2015   Procedure: Coronary/Graft Angiography;  Surgeon: [name], MD;  Location: [place] [facility];  Service: Cardiovascular;  Laterality: N/A;  coronaries only    CARDIAC CATHETERIZATION N/A 04/21/2016   Procedure: Left Heart Cath and Coronary Angiography;  Surgeon: Wellington Hampshire, MD;  Location: [place] [facility];  Service: Cardiovascular;  Laterality: N/A;   CARDIAC CATHETERIZATION N/A 06/14/2016   Procedure: Left Heart Cath and Cors/Grafts Angiography;  Surgeon: Lorretta Harp, MD;  Location: [place] [facility];  Service: Cardiovascular;  Laterality: N/A;   CARDIAC CATHETERIZATION N/A 09/08/2016   Procedure: Left Heart Cath and Cors/Grafts Angiography;  Surgeon: Wellington Hampshire, MD;  Location: [place] [facility];  Service: Cardiovascular;  Laterality: N/A;   CARDIAC CATHETERIZATION     CORONARY ARTERY BYPASS GRAFT N/A 11/28/2015   Procedure: CORONARY ARTERY BYPASS GRAFTING (CABG) TIMES FIVE USING LEFT INTERNAL MAMMARY ARTERY AND RIGHT GREATER SAPHENOUS,VIEN HARVEATED BY ENDOVIEN, INTRAOPPRATIVE TEE;  Surgeon: Gaye Pollack, MD;  Location: [place];  Service: Open Heart Surgery;  Laterality: N/A;   CORONARY STENT INTERVENTION N/A 10/05/2017   Procedure: CORONARY STENT INTERVENTION;  Surgeon: [name], MD;  Location: [place] [facility];  Service: Cardiovascular;  Laterality: N/A;   ESOPHAGOGASTRODUODENOSCOPY (EGD) WITH PROPOFOL N/A 07/20/2019   Procedure: ESOPHAGOGASTRODUODENOSCOPY (EGD) WITH PROPOFOL;  Surgeon: Carol Ada, MD;  [facility]: [facility];  Service: Endoscopy;  Laterality: N/A;   ESOPHAGOGASTRODUODENOSCOPY (EGD) WITH PROPOFOL N/A 01/30/2021   Procedure: ESOPHAGOGASTRODUODENOSCOPY (EGD) WITH PROPOFOL;  Surgeon: Carol Ada, MD;  [facility]: [facility];  Service: Endoscopy;  Laterality: N/A;   FIDUCIAL MARKER PLACEMENT  12/24/2020   Procedure: FIDUCIAL DYE MARKER PLACEMENT;  Surgeon: Garner Nash, DO;  Location: [place] [facility];  Service: Pulmonary;;   HUMERUS SURGERY Right 1969   "tumor inside bone; filled it w/bone chips"   INTERCOSTAL NERVE BLOCK Right 12/24/2020   Procedure: INTERCOSTAL NERVE BLOCK;  Surgeon: [name], MD;  Location: [place];  Service: Thoracic;  Laterality: Right;   IR IMAGING GUIDED PORT INSERTION  02/06/2021   LEFT HEART CATH AND  CORS/GRAFTS ANGIOGRAPHY N/A 03/11/2017   Procedure: Left Heart Cath and Cors/Grafts Angiography;  Surgeon: Leonie Man, MD;  Location: [place] [facility];  Service: Cardiovascular;  Laterality: N/A;   LEFT HEART CATH AND CORS/GRAFTS ANGIOGRAPHY N/A 10/05/2017   Procedure: LEFT HEART CATH AND CORS/GRAFTS ANGIOGRAPHY;  Surgeon: [name], MD;  Location: [place] [facility];  Service: Cardiovascular;  Laterality: N/A;   LEFT HEART CATH AND CORS/GRAFTS ANGIOGRAPHY N/A 04/11/2019   Procedure: LEFT HEART CATH AND CORS/GRAFTS ANGIOGRAPHY;  Surgeon: [name], MD;  Location: [place] [facility];  Service: Cardiovascular;  Laterality: N/A;   NODE DISSECTION Right 12/24/2020   Procedure: NODE DISSECTION;  Surgeon: [name], MD;  Location: [place];  Service: Thoracic;  Laterality: Right;   PERIPHERAL VASCULAR CATHETERIZATION N/A 06/14/2016   Procedure: Lower Extremity Angiography;  Surgeon: Lorretta Harp, MD;  Location: [place] [facility];  Service: Cardiovascular;  Laterality: N/A;   VIDEO BRONCHOSCOPY WITH ENDOBRONCHIAL NAVIGATION Right 12/24/2020   Procedure: VIDEO BRONCHOSCOPY WITH ENDOBRONCHIAL NAVIGATION;  Surgeon: Garner Nash, DO;  Location: [place] [facility];  Service: Pulmonary;  Laterality: Right;   VIDEO BRONCHOSCOPY WITH ENDOBRONCHIAL ULTRASOUND N/A 12/24/2020   Procedure: VIDEO BRONCHOSCOPY WITH ENDOBRONCHIAL ULTRASOUND;  Surgeon: Garner Nash, DO;  Location: [place];  Service: Pulmonary;  Laterality: N/A;   VIDEO BRONCHOSCOPY WITH INSERTION OF INTERBRONCHIAL VALVE (IBV) N/A 12/31/2020   Procedure: VIDEO BRONCHOSCOPY WITH INSERTION OF INTERBRONCHIAL VALVE (IBV).VALVE IN CARTRIDGE 58mm,9mm. CHEST TUBE PLACEMENT.;  Surgeon: [name], MD;  Location: [facility];  Service: Thoracic;  Laterality: N/A;    Allergies  Allergies  Allergen Reactions   Prednisone Other (See Comments)    States that this med makes him "crazy"   Tetanus Toxoids Swelling and  Other (See Comments)    Fever, Swelling of the arm    Wellbutrin [Bupropion] Other (See Comments)    Crazy thoughts, nightmares   Indomethacin Other (See Comments)    rectal bleeding   Other     Other reaction(s): Unknown   Varenicline Other (See Comments)    Dreams Other reaction(s): Unknown Other reaction(s): Unknown    History of Present Illness    [name] is a 65 y.o. male with a hx of CAD (anterior STEMI and emergent CABG 2017, 09/2017 PCI of RCA), COVID-19, anemia, PE, atrial fibrillation, stage IV neuroendocrine carcinoma with brain metastasis s/p right lobectomy and chemotherapy last seen 05/13/21 by Dr. Irish Lack.  His coronary artery disease dates back to 2017 with NSTEMI and emergent CABG. Multiple episodes of chest pain since that time often attributed to anxiety. Mulitple caths 2018 and stress test with no PCI. 09/2017 cath and PCI of RCA due to narrowing of the graft of the distal RCA. Echo 08/2018 with LVEF 55-60%, no valvular abnormalities. CTA with mild prominence of ascending aorta 3.9x3.9cm. Cardiac cath 03/2019 due to chest pain with patent LIMA-LAD/SVG-OM, SVG-PDA patent with proximal disease which was unchanged, patent stent to RCA, graft to diagonal occluded, normal LVEF. Recommended for medical therapy.   He had COVID 09/2020 but was not hospitalized. ED visit 09/2020 for chest pain with CT concerning for primary bronchogenic carcinoma. 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 He underwent stereotactic radiosurgery, right lobectomy, and chemotherapy. Chemotherapy was difficult to tolerate. Admitted 11/2020 with right upper lobectomy and developed atrial fibrillation with RVR. Monitor at discharge with no recurrent atrial fibrillation. As he was maintaining NSR, anticoagulation was deferred on discharge. ED visit 04/26/21 with diagnosis of PE started on Xarelto. Hospitalized 03/2021 with anemia requiring  1u PRBC. Last seen in clinic 05/13/21, no changes were made at that time.   Admitted 06/06/21-06/11/21 with hemoptysis without anemia, right sided chest pain presumed due to coughing, orthostatic hypotension. Antihypertensives were held and Midodrine 2.5mg  TID initiated.   He presents today for follow up with his daughter. Shares with me that he has had difficulty tolerating chemotherapy and is glad to be done. Lacks appetite and drinks approximately 48 oz of fluid per day. He notes he feels well sitting or laying down but when up moving feel tachycardic, lightheaded, unsteady. Heart rate as high as 140s or 150s just with walking.  No near syncope nor syncope. No chest pain. His dyspnea on exertion is stable at baseline and he attributes it to inactivity.   EKGs/Labs/Other Studies Reviewed:   The following studies were reviewed today:  Echo 12/27/20 1. Left ventricular ejection fraction, by estimation, is 55 to 60%. The  left ventricle has normal  function. The left ventricle has no regional  wall motion abnormalities. Left ventricular diastolic parameters are  consistent with Grade I diastolic  dysfunction (impaired relaxation).   2. Right ventricular systolic function is normal. The right ventricular  size is normal.   3. The mitral valve is normal in structure. No evidence of mitral valve  regurgitation. No evidence of mitral stenosis.   4. The aortic valve is normal in structure. Aortic valve regurgitation is  not visualized. No aortic stenosis is present.   5. The inferior vena cava is normal in size with greater than 50%  respiratory variability, suggesting right atrial pressure of 3 mmHg.   Monitor 02/06/21 Normal sinus rhythm with rare PACs and PVCs. Brief, string of PACs not associated with symptoms. No sustained pathologic arrhythmias.     Patch Wear Time:  14 days and 0 hours (2022-05-14T12:06:36-0400 to 2022-05-28T12:06:36-0400)   Patient had a min HR of 45 bpm, max HR of 203  bpm, and avg HR of 82 bpm. Predominant underlying rhythm was Sinus Rhythm. 1 run of Supraventricular Tachycardia occurred lasting 6 beats with a max rate of 203 bpm (avg 199 bpm). Isolated SVEs were rare  (<1.0%), SVE Couplets were rare (<1.0%), and SVE Triplets were rare (<1.0%). Isolated VEs were rare (<1.0%), and no VE Couplets or VE Triplets were present.    EKG:  No EKG today  Recent Labs: 12/26/2020: TSH 0.847 06/06/2021: ALT 16; B Natriuretic Peptide 83.3 06/11/2021: BUN 22; Creatinine, Ser 1.06; Hemoglobin 10.7; Magnesium 2.0; Platelets 201; Potassium 4.3; Sodium 142  Recent Lipid Panel    Component Value Date/Time   CHOL 82 12/27/2020 0125   CHOL 114 11/08/2017 0833   TRIG 97 12/27/2020 0125   HDL 35 (L) 12/27/2020 0125   HDL 52 11/08/2017 0833   CHOLHDL 2.3 12/27/2020 0125   VLDL 19 12/27/2020 0125   LDLCALC 28 12/27/2020 0125   LDLCALC 43 11/08/2017 0833    Home Medications   Current Meds  Medication Sig   acetaminophen (TYLENOL) 500 MG tablet Take 1,000 mg by mouth every 6 (six) hours as needed for moderate pain.   atorvastatin (LIPITOR) 80 MG tablet TAKE 1 TABLET(80 MG) BY MOUTH DAILY   ferrous gluconate (FERGON) 324 MG tablet Take 1 tablet (324 mg total) by mouth daily with breakfast.   lidocaine-prilocaine (EMLA) cream Apply 1 application topically once as needed (port access).   magnesium oxide (MAG-OX) 400 (240 Mg) MG tablet Take 1 tablet (400 mg total) by mouth daily.   metoprolol succinate (TOPROL-XL) 25 MG 24 hr tablet Take 1 tablet (25 mg total) by mouth daily.   midodrine (PROAMATINE) 2.5 MG tablet Take 1 tablet (2.5 mg total) by mouth 3 (three) times daily with meals.   nitroGLYCERIN (NITROSTAT) 0.4 MG SL tablet PLACE 1 TABLET UNDER THE TONGUE EVERY 5 MINUTES AS NEEDED FOR CHEST PAIN. 3 DOSES MAX   ranolazine (RANEXA) 1000 MG SR tablet Take 1 tablet (1,000 mg total) by mouth 2 (two) times daily.   tiZANidine (ZANAFLEX) 2 MG tablet Take 1 tablet (2 mg total) by  mouth every 8 (eight) hours as needed for muscle spasms.   XARELTO 20 MG TABS tablet Take 20 mg by mouth daily.     Review of Systems      All other systems reviewed and are otherwise negative except as noted above.  Physical Exam    VS:  BP 124/78   Pulse 74   Ht 5\' 9"  (1.753 m)  Wt 171 lb 11.2 oz (77.9 kg)   SpO2 99%   BMI 25.36 kg/m  , BMI Body mass index is 25.36 kg/m.  Wt Readings from Last 3 Encounters:  06/15/21 171 lb 11.2 oz (77.9 kg)  06/06/21 167 lb (75.8 kg)  06/04/21 167 lb (75.8 kg)    GEN: Thin appearing, well developed, in no acute distress. HEENT: normal. Neck: Supple, no JVD, carotid bruits, or masses. Cardiac: RRR, no murmurs, rubs, or gallops. No clubbing, cyanosis, edema.  Radials/PT 2+ and equal bilaterally.  Respiratory:  Respirations regular and unlabored, clear to auscultation bilaterally. GI: Soft, nontender, nondistended. MS: No deformity or atrophy. Skin: Warm and dry, no rash. Neuro:  Strength and sensation are intact. Psych: Normal affect.  Assessment & Plan    CAD s/p CABG and DES to RCA - Stable with no anginal symptoms. No indication for ischemic evaluation.  GDMT includes Atorvastatin 80mg  QD, Metoprolol succinate 25mg  QD, PRN nitroglycerin. No aspirin due to chronic anticoagulation. Heart healthy diet and regular cardiovascular exercise encouraged.    Anemia - Continue to follow with hematology. Reports no melena nor hematuria.   PAF - Maintaining NSR by auscultation today. Isolated episode in setting of postop lobectomy 11/2020. Has not been maintained on anticoagulation for atrial fib. Continue current dose Metoprolol. Given reports of recent tachycardia up to 150bpm with ambulation will place 14 day ZIO to rule out recurrent atrial fibrillation.   HLD, LDL goal <70 - Continue Atorvastatin 80mg  QD. Denies myalgias.  PE - Diagnosed 03/2021. Appropriately anticoagulated. Follows with oncology.   HTN / Orthostatic hypotension- Amlodipine  and Lisinopril held during recent admission. Now with orthostatic hypotension. Echo 11/2020 with normal LVEF and no significant valvular abnormalities. Increased hydration, slow position changes, increased hydration encouraged. Continue Midodrine 2.5mg  TID. Refill provided. Home monitoring of BP encouraged. Hopeful to be able to eventually wean of Midodrine as hydration, appetite, blood pressure improve. Plan for carotid duplex to rule out significant carotid stenosis as contributory to lightheadedness.   Disposition: Follow up as scheduled with Dr. Irish Lack.  Signed, Loel Dubonnet, NP 06/15/2021, 9:49 AM [place]

## 2021-06-15 NOTE — Patient Instructions (Addendum)
Medication Instructions:  Continue your current medications.   *If you need a refill on your cardiac medications before your next appointment, please call your pharmacy*  Lab Work: None ordered today.   Testing/Procedures: Your physician has requested that you have a carotid duplex. This test is an ultrasound of the carotid arteries in your neck. It looks at blood flow through these arteries that supply the brain with blood. Allow one hour for this exam. There are no restrictions or special instructions.  Your physician has recommended that you wear a Zio monitor for 14 days.   This monitor is a medical device that records the heart's electrical activity. Doctors most often use these monitors to diagnose arrhythmias. Arrhythmias are problems with the speed or rhythm of the heartbeat. The monitor is a small device applied to your chest. You can wear one while you do your normal daily activities. While wearing this monitor if you have any symptoms to push the button and record what you felt. Once you have worn this monitor for the period of time provider prescribed (Usually 14 days), you will return the monitor device in the postage paid box. Once it is returned they will download the data collected and provide [name] with a report which the provider will then review and we will call you with those results. Important tips:  Avoid showering during the first 24 hours of wearing the monitor. Avoid excessive sweating to help maximize wear time. Do not submerge the device, no hot tubs, and no swimming pools. Keep any lotions or oils away from the patch. After 24 hours you may shower with the patch on. Take brief showers with your back facing the shower head.  Do not remove patch once it has been placed because that will interrupt data and decrease adhesive wear time. Push the button when you have any symptoms and write down what you were feeling. Once you have completed wearing your monitor, remove and  place into box which has postage paid and place in your outgoing mailbox.  If for some reason you have misplaced your box then call our office and we can provide another box and/or mail it off for you.  Follow-Up: At [facility], you and your health needs are our priority.  As part of our continuing mission to provide you with exceptional heart care, we have created designated Provider Care Teams.  These Care Teams include your primary Cardiologist (physician) and Advanced Practice Providers (APPs -  Physician Assistants and Nurse Practitioners) who all work together to provide you with the care you need, when you need it.  We recommend signing up for the patient portal called "MyChart".  Sign up information is provided on this After Visit Summary.  MyChart is used to connect with patients for Virtual Visits (Telemedicine).  Patients are able to view lab/test results, encounter notes, upcoming appointments, etc.  Non-urgent messages can be sent to your provider as well.   To learn more about what you can do with MyChart, go to [email or web address].    Your next appointment:   As scheduled with Dr. Irish Lack   Other Instructions  Orthostatic Hypotension Blood pressure is a measurement of how strongly, or weakly, your circulating blood is pressing against the walls of your arteries. Orthostatic hypotension is a drop in blood pressure that can happen when you change positions, such as when you go from lying down to standing. Arteries are blood vessels that carry blood from your heart throughout your body. When blood  pressure is too low, you may not get enough blood to your brain or to the rest of your organs. Orthostatic hypotension can cause light-headedness, sweating, rapid heartbeat, blurred vision, and fainting. These symptoms require further investigation into the cause. What are the causes? Orthostatic hypotension can be caused by many things, including: Sudden changes in posture, such as  standing up quickly after you have been sitting or lying down. Loss of blood (anemia) or loss of body fluids (dehydration). Heart problems, neurologic problems, or hormone problems. Pregnancy. Aging. The risk for this condition increases as you get older. Severe infection (sepsis). Certain medicines, such as medicines for high blood pressure or medicines that make the body lose excess fluids (diuretics). What are the signs or symptoms? Symptoms of this condition may include: Weakness, light-headedness, or dizziness. Sweating. Blurred vision. Tiredness (fatigue). Rapid heartbeat. Fainting, in severe cases. How is this diagnosed? This condition is diagnosed based on: Your symptoms and medical history. Your blood pressure measurements. Your health care provider will check your blood pressure when you are: Lying down. Sitting. Standing. A blood pressure reading is recorded as two numbers, such as "120 over 80" (or 120/80). The first ("top") number is called the systolic pressure. It is a measure of the pressure in your arteries as your heart beats. The second ("bottom") number is called the diastolic pressure. It is a measure of the pressure in your arteries when your heart relaxes between beats. Blood pressure is measured in a unit called mmHg. Healthy blood pressure for most adults is 120/80 mmHg. Orthostatic hypotension is defined as a 20 mmHg drop in systolic pressure or a 10 mmHg drop in diastolic pressure within 3 minutes of standing. Other information or tests that may be used to diagnose orthostatic hypotension include: Your other vital signs, such as your heart rate and temperature. Blood tests. An electrocardiogram (ECG) or echocardiogram. A Holter monitor. This is a device you wear that records your heart rhythm continuously, usually for 24-48 hours. Tilt table test. For this test, you will be safely secured to a table that moves you from a lying position to an upright position.  Your heart rhythm and blood pressure will be monitored during the test. How is this treated? This condition may be treated by: Changing your diet. This may involve eating more salt (sodium) or drinking more water. Changing the dosage of certain medicines you are taking that might be lowering your blood pressure. Correcting the underlying reason for the orthostatic hypotension. Wearing compression stockings. Taking medicines to raise your blood pressure. Avoiding actions that trigger symptoms. Follow these instructions at home: Medicines Take over-the-counter and prescription medicines only as told by your health care provider. Follow instructions from your health care provider about changing the dosage of your current medicines, if this applies. Do not stop or adjust any of your medicines on your own. Eating and drinking  Drink enough fluid to keep your urine pale yellow. Eat extra salt only as directed. Do not add extra salt to your diet unless advised by your health care provider. Eat frequent, small meals. Avoid standing up suddenly after eating. General instructions  Get up slowly from lying down or sitting positions. This gives your blood pressure a chance to adjust. Avoid hot showers and excessive heat as directed by your health care provider. Engage in regular physical activity as directed by your health care provider. If you have compression stockings, wear them as told. Keep all follow-up visits. This is important. Contact a health  care provider if: You have a fever for more than 2-3 days. You feel more thirsty than usual. You feel dizzy or weak. Get help right away if: You have chest pain. You have a fast or irregular heartbeat. You become sweaty or feel light-headed. You feel short of breath. You faint. You have any symptoms of a stroke. "BE FAST" is an easy way to remember the main warning signs of a stroke: B - Balance. Signs are dizziness, sudden trouble walking,  or loss of balance. E - Eyes. Signs are trouble seeing or a sudden change in vision. F - Face. Signs are sudden weakness or numbness of the face, or the face or eyelid drooping on one side. A - Arms. Signs are weakness or numbness in an arm. This happens suddenly and usually on one side of the body. S - Speech. Signs are sudden trouble speaking, slurred speech, or trouble understanding what people say. T - Time. Time to call emergency services. Write down what time symptoms started. You have other signs of a stroke, such as: A sudden, severe headache with no known cause. Nausea or vomiting. Seizure. These symptoms may represent a serious problem that is an emergency. Do not wait to see if the symptoms will go away. Get medical help right away. Call your local emergency services (911 in the [place] Do not drive yourself to the hospital. Summary Orthostatic hypotension is a sudden drop in blood pressure. It can cause light-headedness, sweating, rapid heartbeat, blurred vision, and fainting. Orthostatic hypotension can be diagnosed by having your blood pressure taken while lying down, sitting, and then standing. Treatment may involve changing your diet, wearing compression stockings, sitting up slowly, adjusting your medicines, or correcting the underlying reason for the orthostatic hypotension. Get help right away if you have chest pain, a fast or irregular heartbeat, or symptoms of a stroke. This information is not intended to replace advice given to you by your health care provider. Make sure you discuss any questions you have with your health care provider. Document Revised: 10/30/2020 Document Reviewed: 10/30/2020 [employer] Patient Education  [employer].

## 2021-06-16 ENCOUNTER — Telehealth: Payer: Self-pay

## 2021-06-16 NOTE — Telephone Encounter (Signed)
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 He stated that it started a month after surgery and has not stopped prompting a call to the office. He states that it feels like "broken ribs". He also states that he has had a scan done to confirm no broken ribs present. Patient states that he has "no broken ribs and no clots in my lungs". States that the site is tender to touch and shoots from his back to his chest and comes and goes. 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 Kipp Brood. He stated that he wanted to wait and think about it and he would give the office a call back if he wished to follow-up with Dr. Kipp Brood before the 6 month follow-up appointment that is to be scheduled.

## 2021-06-16 NOTE — Progress Notes (Signed)
[facility] OFFICE PROGRESS NOTE  [name], MD [place] [address]  DIAGNOSIS: Stage IV (pT1c, N1, M1b) large cell neuroendocrine carcinoma.  He presented with a right upper lobe lung nodule and hilar lymphadenopathy.  He was diagnosed in April 2022. He also had a small metastatic brain lesion.    PDL1: 0%  PRIOR THERAPY: 1) Robotic assisted thoracic surgery with a right upper lobectomy which was performed on 12/24/2020.   2) SRS to the small metastatic brain lesion completed on 02/13/2021 under the care of Dr. Lisbeth Renshaw. 3) Adjuvant chemotherapy with cisplatin 80 mg/m2 on day 1, etoposide 100 mg per metered squared on days 1, 2, and 3, IV every 3 weeks.  Status post 4 cycles. Last dose on 04/20/21.    CURRENT THERAPY:  Observation  INTERVAL HISTORY: [name] 65 y.o. male returns to the clinic today for a follow-up visit.  The patient was last seen in clinic on 05/11/2021.  The patient was diagnosed with large cell neuroendocrine carcinoma in April 2022 and underwent a robotic assisted thoracic surgery with a right upper lobectomy.  He then followed this with 4 cycles of adjuvant chemotherapy which the patient had very poor tolerance to.  He also received SRS to the metastatic brain lesions in June 2022 under the care of Dr. Lisbeth Renshaw.   He was last seen on 05/11/2021. The patient had a restaging scan at that time which not show any evidence of disease recurrence.  It was recommended that the patient continue on observation with a restaging CT scan in 3 months.  In the interval since his last appointment, the patient presented to the emergency room on 06/06/2021 for chief complaint of hemoptysis without anemia and right-sided chest pain which is presumed to be secondary to coughing.  The patient had a CTA which was negative for a pulmonary embolus.  There is no evidence of disease progression from an oncology standpoint.  Of note, the patient was on  Plavix and aspirin due to his history of CAD (anterior STEMI and emergent CABG 2017). He also is on Eliquis for history of PE in August 2022.  While admitted to the hospital for the hemoptysis, the patient Plavix was discontinued by cardiology.  He has followed up with them outpatient for orthostatic hypotension.  He is wondering how long he needs to be on Eliquis form.  The patient continues to have endobronchial valves and under the care of Dr. Kipp Brood.  The patient states that Dr. Kipp Brood was considering taking this out once he finishes anticoagulation.  Patient continues to have right-sided rib pain radiating from around his back to his right rib cage as well as his upper abdomen near the surgical site.  The patient states his pain feels similar to when he has a cracked rib in the past.  No rib fractures were identified while admitted to the hospital a few weeks ago.  Patient is very concerned if his pain could be related to cancer recurrence.  He saw his primary care provider on Friday.  The patient's been taking Tylenol for his pain.  He discussed that he did not want to take any narcotics for his rib pain.  Otherwise he is reporting fatigue and shortness of breath with exertion.  He also feels like he is having persistent "mental fog" and feeling of being "spacey".  He is here today for evaluation and hospital follow-up.    MEDICAL HISTORY: Past Medical History:  Diagnosis Date   Anxiety    Arthritis    Basal cell carcinoma (BCC) of forehead    CAD (coronary artery disease)    a. 10/2015 ant STEMI >> LHC with 3 v CAD; oLAD tx with POBA >> emergent CABG. b. Multiple evals since that time, early graft failure of SVG-RCA by cath 03/2016. c. 2/19 PCI/DES x1 to pRCA, normal EF.   Carotid artery disease ([facility])    a. 40-59% BICA 02/2018.   Depression    Dyspnea    Ectopic atrial tachycardia (HCC)    Esophageal reflux    eosinophil esophagitis   Family history of adverse reaction to anesthesia     "sister has PONV" (06/21/2017)   Former tobacco use    Gout    Hepatitis C    "treated and cured" (06/21/2017)   High cholesterol    History of kidney stones    Hypertension    Ischemic cardiomyopathy    a. EF 25-30% at intraop TEE 4/17  //  b. Limited Echo 5/17 - EF 45-50%, mild ant HK. c. EF 55-65% by cath 09/2017.   Migraine    "3-4/yr" (06/21/2017)   Myocardial infarction ([facility]) 10/2015   Palpitations    Sinus bradycardia    a. HR dropping into 40s in 02/2016 -> BB reduced.   Stroke [facility]) 10/2016   "small one; sometimes my memory/cognitive issues" (06/21/2017)   Symptomatic hypotension    a. 02/2016 ER visit -> meds reduced.   Syncope    Wears dentures    Wears glasses     ALLERGIES:  is allergic to prednisone, tetanus toxoids, wellbutrin [bupropion], indomethacin, other, and varenicline.  MEDICATIONS:  Current Outpatient Medications  Medication Sig Dispense Refill   acetaminophen (TYLENOL) 500 MG tablet Take 1,000 mg by mouth every 6 (six) hours as needed for moderate pain.     atorvastatin (LIPITOR) 80 MG tablet TAKE 1 TABLET(80 MG) BY MOUTH DAILY 90 tablet 2   ferrous gluconate (FERGON) 324 MG tablet Take 1 tablet (324 mg total) by mouth daily with breakfast. 30 tablet 2   lidocaine-prilocaine (EMLA) cream Apply 1 application topically once as needed (port access).     magnesium oxide (MAG-OX) 400 (240 Mg) MG tablet Take 1 tablet (400 mg total) by mouth daily. 30 tablet 1   metoprolol succinate (TOPROL-XL) 25 MG 24 hr tablet Take 1 tablet (25 mg total) by mouth daily. 90 tablet 3   midodrine (PROAMATINE) 2.5 MG tablet Take 1 tablet (2.5 mg total) by mouth 3 (three) times daily with meals. 90 tablet 5   nitroGLYCERIN (NITROSTAT) 0.4 MG SL tablet PLACE 1 TABLET UNDER THE TONGUE EVERY 5 MINUTES AS NEEDED FOR CHEST PAIN. 3 DOSES MAX 25 tablet 4   ondansetron (ZOFRAN) 8 MG tablet Take 1 tablet (8 mg total) by mouth every 8 (eight) hours as needed for nausea or vomiting.  Starting 3 days after chemotherapy (Patient not taking: Reported on 06/15/2021) 30 tablet 2   pantoprazole (PROTONIX) 40 MG tablet Take 1 tablet (40 mg total) by mouth daily. (Patient not taking: Reported on 06/15/2021) 14 tablet 0   prochlorperazine (COMPAZINE) 10 MG tablet Take 1 tablet (10 mg total) by mouth every 6 (six) hours as needed. (Patient not taking: Reported on 06/15/2021) 30 tablet 2   ranolazine (RANEXA) 1000 MG SR tablet Take 1 tablet (1,000 mg total) by mouth 2 (two) times daily. 180 tablet 3   Tiotropium Bromide-Olodaterol (STIOLTO RESPIMAT) 2.5-2.5 MCG/ACT AERS Inhale 2 puffs  into the lungs daily. (Patient not taking: Reported on 06/15/2021) 4 g 3   tiZANidine (ZANAFLEX) 2 MG tablet Take 1 tablet (2 mg total) by mouth every 8 (eight) hours as needed for muscle spasms. 30 tablet 0   XARELTO 20 MG TABS tablet Take 20 mg by mouth daily.     No current facility-administered medications for this visit.    SURGICAL HISTORY:  Past Surgical History:  Procedure Laterality Date   25 GAUGE PARS PLANA VITRECTOMY WITH 20 GAUGE MVR PORT  12/31/2020   Procedure: 25 GAUGE PARS PLANA VITRECTOMY WITH 20 GAUGE MVR PORT;  Surgeon: [name], MD;  Location: [facility];  Service: Thoracic;;   ANTERIOR CERVICAL DECOMP/DISCECTOMY FUSION N/A 10/17/2018   Procedure: Anterior Cervical Decompression Fusion - Cervical seven -Thoracic one;  Surgeon: Consuella Lose, MD;  Location: [facility];  Service: Neurosurgery;  Laterality: N/A;   BASAL CELL CARCINOMA EXCISION     "forehead   BIOPSY  07/20/2019   Procedure: BIOPSY;  Surgeon: Carol Ada, MD;  Location: [facility];  Service: Endoscopy;;   BRONCHIAL BIOPSY  12/24/2020   Procedure: BRONCHIAL BIOPSIES;  Surgeon: Garner Nash, DO;  Location: [facility];  Service: Pulmonary;;   BRONCHIAL BRUSHINGS  12/24/2020   Procedure: BRONCHIAL BRUSHINGS;  Surgeon: Garner Nash, DO;  Location: [facility];  Service: Pulmonary;;   BRONCHIAL NEEDLE  ASPIRATION BIOPSY  12/24/2020   Procedure: BRONCHIAL NEEDLE ASPIRATION BIOPSIES;  Surgeon: Garner Nash, DO;  Location: [facility];  Service: Pulmonary;;   CARDIAC CATHETERIZATION N/A 11/28/2015   Procedure: Left Heart Cath and Coronary Angiography;  Surgeon: [name], MD;  Location: [facility];  Service: Cardiovascular;  Laterality: N/A;   CARDIAC CATHETERIZATION N/A 11/28/2015   Procedure: Coronary Balloon Angioplasty;  Surgeon: [name], MD;  Location: [facility];  Service: Cardiovascular;  Laterality: N/A;  ostial LAD   CARDIAC CATHETERIZATION N/A 11/28/2015   Procedure: Coronary/Graft Angiography;  Surgeon: [name], MD;  Location: [facility];  Service: Cardiovascular;  Laterality: N/A;  coronaries only    CARDIAC CATHETERIZATION N/A 04/21/2016   Procedure: Left Heart Cath and Coronary Angiography;  Surgeon: Wellington Hampshire, MD;  Location: [facility];  Service: Cardiovascular;  Laterality: N/A;   CARDIAC CATHETERIZATION N/A 06/14/2016   Procedure: Left Heart Cath and Cors/Grafts Angiography;  Surgeon: Lorretta Harp, MD;  Location: [facility];  Service: Cardiovascular;  Laterality: N/A;   CARDIAC CATHETERIZATION N/A 09/08/2016   Procedure: Left Heart Cath and Cors/Grafts Angiography;  Surgeon: Wellington Hampshire, MD;  Location: [facility];  Service: Cardiovascular;  Laterality: N/A;   CARDIAC CATHETERIZATION     CORONARY ARTERY BYPASS GRAFT N/A 11/28/2015   Procedure: CORONARY ARTERY BYPASS GRAFTING (CABG) TIMES FIVE USING LEFT INTERNAL MAMMARY ARTERY AND RIGHT GREATER SAPHENOUS,VIEN HARVEATED BY ENDOVIEN, INTRAOPPRATIVE TEE;  Surgeon: Gaye Pollack, MD;  Location: [facility];  Service: Open Heart Surgery;  Laterality: N/A;   CORONARY STENT INTERVENTION N/A 10/05/2017   Procedure: CORONARY STENT INTERVENTION;  Surgeon: [name], MD;  Location: [facility] [facility];  Service: Cardiovascular;  Laterality: N/A;    ESOPHAGOGASTRODUODENOSCOPY (EGD) WITH PROPOFOL N/A 07/20/2019   Procedure: ESOPHAGOGASTRODUODENOSCOPY (EGD) WITH PROPOFOL;  Surgeon: Carol Ada, MD;  [facility];  Service: Endoscopy;  Laterality: N/A;   ESOPHAGOGASTRODUODENOSCOPY (EGD) WITH PROPOFOL N/A 01/30/2021   Procedure: ESOPHAGOGASTRODUODENOSCOPY (EGD) WITH PROPOFOL;  Surgeon: Carol Ada, MD;  Location: [facility];  Service: Endoscopy;  Laterality: N/A;   FIDUCIAL MARKER PLACEMENT  12/24/2020   Procedure: FIDUCIAL DYE MARKER PLACEMENT;  Surgeon: Garner Nash, DO;  Location: [facility];  Service: Pulmonary;;   HUMERUS SURGERY Right 1969   "tumor inside bone; filled it w/bone chips"   INTERCOSTAL NERVE BLOCK Right 12/24/2020   Procedure: INTERCOSTAL NERVE BLOCK;  Surgeon: [name], MD;  Location: [facility];  Service: Thoracic;  Laterality: Right;   IR IMAGING GUIDED PORT INSERTION  02/06/2021   LEFT HEART CATH AND CORS/GRAFTS ANGIOGRAPHY N/A 03/11/2017   Procedure: Left Heart Cath and Cors/Grafts Angiography;  Surgeon: Leonie Man, MD;  Location: [facility];  Service: Cardiovascular;  Laterality: N/A;   LEFT HEART CATH AND CORS/GRAFTS ANGIOGRAPHY N/A 10/05/2017   Procedure: LEFT HEART CATH AND CORS/GRAFTS ANGIOGRAPHY;  Surgeon: [name], MD;  Location: [facility];  Service: Cardiovascular;  Laterality: N/A;   LEFT HEART CATH AND CORS/GRAFTS ANGIOGRAPHY N/A 04/11/2019   Procedure: LEFT HEART CATH AND CORS/GRAFTS ANGIOGRAPHY;  Surgeon: [name], MD;  Location: [facility];  Service: Cardiovascular;  Laterality: N/A;   NODE DISSECTION Right 12/24/2020   Procedure: NODE DISSECTION;  Surgeon: [name], MD;  Location: [facility];  Service: Thoracic;  Laterality: Right;   PERIPHERAL VASCULAR CATHETERIZATION N/A 06/14/2016   Procedure: Lower Extremity Angiography;  Surgeon: Lorretta Harp, MD;  Location: [place] [facility];  Service: Cardiovascular;  Laterality:  N/A;   VIDEO BRONCHOSCOPY WITH ENDOBRONCHIAL NAVIGATION Right 12/24/2020   Procedure: VIDEO BRONCHOSCOPY WITH ENDOBRONCHIAL NAVIGATION;  Surgeon: Garner Nash, DO;  Location: [place] [facility];  Service: Pulmonary;  Laterality: Right;   VIDEO BRONCHOSCOPY WITH ENDOBRONCHIAL ULTRASOUND N/A 12/24/2020   Procedure: VIDEO BRONCHOSCOPY WITH ENDOBRONCHIAL ULTRASOUND;  Surgeon: Garner Nash, DO;  Location: [place];  Service: Pulmonary;  Laterality: N/A;   VIDEO BRONCHOSCOPY WITH INSERTION OF INTERBRONCHIAL VALVE (IBV) N/A 12/31/2020   Procedure: VIDEO BRONCHOSCOPY WITH INSERTION OF INTERBRONCHIAL VALVE (IBV).VALVE IN CARTRIDGE 6m,9mm. CHEST TUBE PLACEMENT.;  Surgeon: LLajuana Matte MD;  [facility];  Service: Thoracic;  Laterality: N/A;    REVIEW OF SYSTEMS:   Review of Systems  Constitutional: Positive for fatigue and decreased appetite. Negative for  chills, fever and unexpected weight change.  HENT: Negative for mouth sores, nosebleeds, sore throat and trouble swallowing.   Eyes: Negative for eye problems and icterus.  Respiratory: Positive for shortness of breath with exertion.  Negative for cough, hemoptysis, and wheezing.   Cardiovascular: Positive for right-sided rib pain. Negative for leg swelling.  Gastrointestinal: Negative for abdominal pain, constipation, diarrhea, nausea and vomiting.  Genitourinary: Negative for bladder incontinence, difficulty urinating, dysuria, frequency and hematuria.   Musculoskeletal: Positive for right posterior thoracic. Negative for gait problem, neck pain and neck stiffness.  Skin: Negative for itching and rash.  Neurological: Negative for dizziness, extremity weakness, gait problem, headaches, light-headedness and seizures.  Hematological: Negative for adenopathy. Does not bruise/bleed easily.  Psychiatric/Behavioral: Negative for confusion, depression and sleep disturbance. The patient is not nervous/anxious.     PHYSICAL EXAMINATION:   Blood pressure (!) 141/88, pulse 78, temperature 97.6 F (36.4 C), temperature source Tympanic, resp. rate 16, weight 170 lb 4 oz (77.2 kg), SpO2 100 %.  ECOG PERFORMANCE STATUS: 1  Physical Exam  Constitutional: Oriented to person, place, and time and thin appearing male, and in no distress.  HENT:  Head: Normocephalic and atraumatic.  Mouth/Throat: Oropharynx is clear and  moist. No oropharyngeal exudate.  Eyes: Conjunctivae are normal. Right eye exhibits no discharge. Left eye exhibits no discharge. No scleral icterus.  Neck: Normal range of motion. Neck supple.  Cardiovascular: Normal rate, regular rhythm, normal heart sounds and intact distal pulses.  Tenderness to palpation over prior surgical site in the posterior rib cage.  No overlying skin changes. Pulmonary/Chest: Effort normal and breath sounds normal. No respiratory distress. No wheezes. No rales.  Abdominal: Soft. Bowel sounds are normal. Exhibits no distension and no mass. There is no tenderness.  Musculoskeletal: Normal range of motion. Exhibits no edema.  Lymphadenopathy:    No cervical adenopathy.  Neurological: Alert and oriented to person, place, and time. Exhibits normal muscle tone. Gait normal. Coordination normal.  Skin: Skin is warm and dry. No rash noted. Not diaphoretic. No erythema. No pallor.  Psychiatric: Mood, memory and judgment normal.  Vitals reviewed.  LABORATORY DATA: Lab Results  Component Value Date   WBC 7.0 06/11/2021   HGB 10.7 (L) 06/11/2021   HCT 33.0 (L) 06/11/2021   MCV 97.6 06/11/2021   PLT 201 06/11/2021      Chemistry      Component Value Date/Time   NA 142 06/11/2021 0605   K 4.3 06/11/2021 0605   CL 109 06/11/2021 0605   CO2 26 06/11/2021 0605   BUN 22 06/11/2021 0605   CREATININE 1.06 06/11/2021 0605   CREATININE 1.19 05/11/2021 0750   CREATININE 1.00 02/12/2016 1038      Component Value Date/Time   CALCIUM 9.9 06/11/2021 0605   ALKPHOS 88 06/06/2021 1736   AST 19  06/06/2021 1736   AST 9 (L) 05/11/2021 0750   ALT 16 06/06/2021 1736   ALT 8 05/11/2021 0750   BILITOT 0.5 06/06/2021 1736   BILITOT 0.5 05/11/2021 0750       RADIOGRAPHIC STUDIES:  DG Ribs Unilateral W/Chest Right  Result Date: 06/06/2021 CLINICAL DATA:  right sided rib pain EXAM: RIGHT RIBS AND CHEST - 3+ VIEW COMPARISON:  Chest x-ray 06/04/2021, CT chest 06/04/2021 FINDINGS: Right chest wall Port-A-Cath with tip overlying right atrium just distal to the superior cavoatrial junction. The heart and mediastinal contours are unchanged. Aortic calcification. Surgical hardware overlies the mediastinum. No focal consolidation. No pulmonary edema. Persistent, possibly slightly increased in size, trace volume right pleural effusion. No pneumothorax. Punctate round metallic BB marker noted overlying the right eleventh rib. No acute displaced right fracture or other bone lesions are seen involving the ribs. No acute osseous abnormality. Multilevel degenerative changes of the spine. IMPRESSION: 1. No acute displaced right rib fracture. Please note, nondisplaced rib fractures may be occult on radiograph. 2. Persistent, possibly slightly increased in size, trace volume right pleural effusion. Electronically Signed   By: Iven Finn M.D.   On: 06/06/2021 16:52   CT Angio Chest PE W/Cm &/Or Wo Cm  Result Date: 06/04/2021 CLINICAL DATA:  Cough. EXAM: CT ANGIOGRAPHY CHEST WITH CONTRAST TECHNIQUE: Multidetector CT imaging of the chest was performed using the standard protocol during bolus administration of intravenous contrast. Multiplanar CT image reconstructions and MIPs were obtained to evaluate the vascular anatomy. CONTRAST:  178mL OMNIPAQUE IOHEXOL 350 MG/ML SOLN COMPARISON:  April 27, 2021. FINDINGS: Cardiovascular: Satisfactory opacification of the pulmonary arteries to the segmental level. No evidence of pulmonary embolism. Normal heart size. No pericardial effusion. Atherosclerosis of thoracic aorta  is noted without aneurysm formation. Status post coronary bypass graft. Mediastinum/Nodes: No enlarged mediastinal, hilar, or axillary lymph nodes. Thyroid gland, trachea, and esophagus  demonstrate no significant findings. Lungs/Pleura: No pneumothorax is noted. Left lung is clear. Stable right upper lobe scarring is noted. Minimal right pleural effusion is noted with adjacent subsegmental atelectasis. Upper Abdomen: No acute abnormality. Musculoskeletal: No chest wall abnormality. No acute or significant osseous findings. Review of the MIP images confirms the above findings. IMPRESSION: No definite evidence of pulmonary embolus. Minimal right pleural effusion is noted with minimal adjacent subsegmental atelectasis. Aortic Atherosclerosis (ICD10-I70.0). Electronically Signed   By: Marijo Conception M.D.   On: 06/04/2021 10:01   DG Chest Port 1 View  Result Date: 06/04/2021 CLINICAL DATA:  65 year old male with lung cancer. Coughing up blood for 1 week. Rib pain. Diarrhea. Denies fever. EXAM: PORTABLE CHEST 1 VIEW COMPARISON:  CT Abdomen and Pelvis 05/05/2021 and earlier. FINDINGS: Portable AP upright view at 0751 hours. Chronic right pleural effusion with pleural thickening as seen by CT last month is stable. Stable right chest power port. Stable lung volumes and mediastinal contours. No pneumothorax, pulmonary edema or new pulmonary opacity. Prior CABG. Prior ACDF. Stable visualized osseous structures. IMPRESSION: Stable chronic right pleural effusion. No new cardiopulmonary abnormality. Electronically Signed   By: Genevie Ann M.D.   On: 06/04/2021 08:38     ASSESSMENT/PLAN:  This is a very pleasant 65 year old Caucasian male diagnosed with stage IV (pT1c, N1, M1c) large cell neuroendocrine carcinoma.  He presented with a right upper lobe lung nodule and hilar lymphadenopathy.   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 His PD-L1 expression is 0%.  He was diagnosed in April 2022. His  foundation one testing is negative for any actionable mutations.    The patient completed SRS to the small metastatic brain lesion under the care of Dr. Lisbeth Renshaw on 02/13/2021.   The patient is currently undergoing adjuvant chemotherapy with cisplatin 60 mg per metered squared, on day 1, etoposide 120 mg per metered squared on days 1, 2, and 3 IV every 3 weeks. He received his first dose on 02/09/21.  His last dose was given on 04/20/2021.  The patient had a poor tolerance to chemotherapy with numerous hospitalizations and emergency room visits.  The patient recently had a CT angiogram performed while in the emergency room on 06/06/2021 which not show any evidence of disease progression.  The patient was seen with Dr. [name] today.  Dr. [name] a lengthy discussion the patient today about his current condition.  The patient is very concerned about cancer recurrence.  Dr. [name] discussed that we could try and arrange for a PET scan to rule out cancer recurrence given his significant rib pain.  The patient is interested in this.  I placed an order.  If no findings for cancer recurrence, his pain is likely secondary to postsurgical pain.     The patient will continue taking Eliquis for his his history of PE.  Dr. [name] discuss anticoagulation in the setting of lung cancer typically is indefinite; however, we may consider discontinuing this after 1 year.  If the patient needs to hold his blood thinner prior to having his endobronchial valves removed, that is alright.    For pain, the patient does not want to take any narcotics reportedly.  Advised to use Tylenol, heating pads, and Salonpas patches if needed.  His fatigue/shortness of breath with exertion is likely multifactorial secondary to deconditioning, anemia, being status post lobectomy.  Advised to exercise to increase his stamina.  Advised to continue taking the iron supplement as well as a  multivitamin for his anemia.  The patient was advised to  call immediately if he has any concerning symptoms in the interval. The patient voices understanding of current disease status and treatment options and is in agreement with the current care plan. All questions were answered. The patient knows to call the clinic with any problems, questions or concerns. We can certainly see the patient much sooner if necessary    Orders Placed This Encounter  Procedures   NM PET Image Restag (PS) Skull Base To Thigh    Standing Status:   Future    Standing Expiration Date:   06/22/2022    Order Specific Question:   If indicated for the ordered procedure, I authorize the administration of a radiopharmaceutical per Radiology protocol    Answer:   Yes    Order Specific Question:   Preferred imaging location?    Answer:   [place], PA-C 06/22/21  ADDENDUM: Hematology/Oncology Attending: I had a face-to-face encounter with the patient today.  I reviewed his record, lab, scan and recommended his care plan.  This is a 65 years old white male with history of stage IV (T1c, N1, M1 C) large cell neuroendocrine carcinoma presented with right upper lobe lung nodule in addition to hilar lymphadenopathy and small solitary brain metastasis diagnosed in April 2022 he has no actionable mutation and PD-L1 expression was 0.  The patient underwent SRS to the small metastatic brain lesion.  He also underwent right upper lobectomy with lymph node dissection on December 24, 2020 under the care of Dr. Kipp Brood.  The patient was also treated with adjuvant systemic chemotherapy with cisplatin and Doutova site last dose was given on 04/20/2021 with no evidence for disease recurrence or metastasis.  The patient has a rough time with his adjuvant therapy with frequent hospitalization.  After completion of 4 cycles of the chemotherapy he is currently on observation and doing fine.  He was seen recently at the emergency department complaining of complaining of  hemoptysis without anemia and right-sided chest pain.  He has imaging studies that showed no concerning findings for disease recurrence or metastasis.  There was no evidence for pulmonary embolism and the patient is currently on Eliquis.  He presented for evaluation of his condition.  He continues to complain of pain on the right side of the chest at the area of the surgical scar.  He is still concerned about disease recurrence in that area and would like to have a PET scan performed for further evaluation of his condition even in the absence of any findings on the CT scan of the chest. I explained to the patient that I would be happy to consider him for the PET scan but it may not be approved by his insurance because of no evidence for disease recurrence on the recent imaging studies. We will see the patient back for follow-up visit in 3 months for evaluation and repeat CT scan of the chest if the PET scan is not approved. He was advised to call immediately if he has any concerning symptoms in the interval. The total time spent in the appointment was 30 minutes. Disclaimer: This note was dictated with voice recognition software. Similar sounding words can inadvertently be transcribed and may be missed upon review. Eilleen Kempf, MD 06/22/21

## 2021-06-22 ENCOUNTER — Inpatient Hospital Stay (HOSPITAL_BASED_OUTPATIENT_CLINIC_OR_DEPARTMENT_OTHER): Payer: Medicare Other | Admitting: Physician Assistant

## 2021-06-22 ENCOUNTER — Other Ambulatory Visit: Payer: Self-pay

## 2021-06-22 ENCOUNTER — Inpatient Hospital Stay: Payer: Medicare Other

## 2021-06-22 ENCOUNTER — Inpatient Hospital Stay: Payer: Medicare Other | Attending: Physician Assistant

## 2021-06-22 VITALS — BP 141/88 | HR 78 | Temp 97.6°F | Resp 16 | Wt 170.2 lb

## 2021-06-22 DIAGNOSIS — C7B8 Other secondary neuroendocrine tumors: Secondary | ICD-10-CM | POA: Diagnosis not present

## 2021-06-22 DIAGNOSIS — R197 Diarrhea, unspecified: Secondary | ICD-10-CM | POA: Diagnosis not present

## 2021-06-22 DIAGNOSIS — D649 Anemia, unspecified: Secondary | ICD-10-CM | POA: Diagnosis not present

## 2021-06-22 DIAGNOSIS — C3491 Malignant neoplasm of unspecified part of right bronchus or lung: Secondary | ICD-10-CM

## 2021-06-22 DIAGNOSIS — C7A8 Other malignant neuroendocrine tumors: Secondary | ICD-10-CM | POA: Insufficient documentation

## 2021-06-22 DIAGNOSIS — Z7901 Long term (current) use of anticoagulants: Secondary | ICD-10-CM | POA: Diagnosis not present

## 2021-06-22 DIAGNOSIS — Z95828 Presence of other vascular implants and grafts: Secondary | ICD-10-CM

## 2021-06-22 MED ORDER — HEPARIN SOD (PORK) LOCK FLUSH 100 UNIT/ML IV SOLN
500.0000 [IU] | Freq: Once | INTRAVENOUS | Status: AC
  Administered 2021-06-22: 500 [IU]

## 2021-06-22 MED ORDER — SODIUM CHLORIDE 0.9% FLUSH
10.0000 mL | Freq: Once | INTRAVENOUS | Status: AC
  Administered 2021-06-22: 10 mL

## 2021-06-23 ENCOUNTER — Other Ambulatory Visit: Payer: Self-pay | Admitting: Radiation Therapy

## 2021-06-23 ENCOUNTER — Ambulatory Visit (INDEPENDENT_AMBULATORY_CARE_PROVIDER_SITE_OTHER): Payer: Medicare Other

## 2021-06-23 DIAGNOSIS — I6523 Occlusion and stenosis of bilateral carotid arteries: Secondary | ICD-10-CM | POA: Diagnosis not present

## 2021-06-23 DIAGNOSIS — C7931 Secondary malignant neoplasm of brain: Secondary | ICD-10-CM

## 2021-06-23 NOTE — Progress Notes (Signed)
Port order entered

## 2021-06-24 ENCOUNTER — Telehealth: Payer: Self-pay

## 2021-06-24 ENCOUNTER — Encounter (HOSPITAL_BASED_OUTPATIENT_CLINIC_OR_DEPARTMENT_OTHER): Payer: Self-pay

## 2021-06-24 ENCOUNTER — Encounter: Payer: Self-pay | Admitting: Internal Medicine

## 2021-06-24 ENCOUNTER — Ambulatory Visit (HOSPITAL_BASED_OUTPATIENT_CLINIC_OR_DEPARTMENT_OTHER): Payer: Medicare Other | Admitting: Family

## 2021-06-24 NOTE — Progress Notes (Deleted)
Office Visit    Patient Name: [name] Date of Encounter: 06/24/2021  PCP:  [name], MD   [place]  Cardiologist:  Larae Grooms, MD  Advanced Practice Provider:  No care team member to display Electrophysiologist:  None      Chief Complaint    [name] is a 65 y.o. 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 10/2015 ant STEMI >> LHC with 3 v CAD; oLAD tx with POBA >> emergent CABG. b. Multiple evals since that time, early graft failure of SVG-RCA by cath 03/2016. c. 2/19 PCI/DES x1 to pRCA, normal EF.   Carotid artery disease ([place])    a. 40-59% BICA 02/2018.   Depression    Dyspnea    Ectopic atrial tachycardia (HCC)    Esophageal reflux    eosinophil esophagitis   Family history of adverse reaction to anesthesia    "sister has PONV" (06/21/2017)   Former tobacco use    Gout    Hepatitis C    "treated and cured" (06/21/2017)   High cholesterol    History of kidney stones    Hypertension    Ischemic cardiomyopathy    a. EF 25-30% at intraop TEE 4/17  //  b. Limited Echo 5/17 - EF 45-50%, mild ant HK. c. EF 55-65% by cath 09/2017.   Migraine    "3-4/yr" (06/21/2017)   Myocardial infarction ([place]) 10/2015   Palpitations    Sinus bradycardia    a. HR dropping into 40s in 02/2016 -> BB reduced.   Stroke [facility]) 10/2016   "small one; sometimes my memory/cognitive issues" (06/21/2017)   Symptomatic hypotension    a. 02/2016 ER visit -> meds reduced.   Syncope    Wears dentures    Wears glasses    Past  Surgical History:  Procedure Laterality Date   25 GAUGE PARS PLANA VITRECTOMY WITH 20 GAUGE MVR PORT  12/31/2020   Procedure: 25 GAUGE PARS PLANA VITRECTOMY WITH 20 GAUGE MVR PORT;  Surgeon: [name], MD;  Location: [place];  Service: Thoracic;;   ANTERIOR CERVICAL DECOMP/DISCECTOMY FUSION N/A 10/17/2018   Procedure: Anterior Cervical Decompression Fusion - Cervical seven -Thoracic one;  Surgeon: Consuella Lose, MD;  Location: [place];  Service: Neurosurgery;  Laterality: N/A;   BASAL CELL CARCINOMA EXCISION     "forehead   BIOPSY  07/20/2019   Procedure: BIOPSY;  Surgeon: Carol Ada, MD;  Location: [facility];  Service: Endoscopy;;   BRONCHIAL BIOPSY  12/24/2020   Procedure: BRONCHIAL BIOPSIES;  Surgeon: Garner Nash, DO;  Location: [place] [facility];  Service: Pulmonary;;   BRONCHIAL BRUSHINGS  12/24/2020   Procedure: BRONCHIAL BRUSHINGS;  Surgeon: Garner Nash, DO;  Location: [place];  Service: Pulmonary;;   BRONCHIAL NEEDLE ASPIRATION BIOPSY  12/24/2020   Procedure: BRONCHIAL NEEDLE ASPIRATION BIOPSIES;  Surgeon: Garner Nash, DO;  Location: [place] [facility];  Service: Pulmonary;;   CARDIAC CATHETERIZATION N/A 11/28/2015   Procedure: Left Heart Cath and Coronary Angiography;  Surgeon: Charlann Lange  Irish Lack, MD;  Location: [place] [facility];  Service: Cardiovascular;  Laterality: N/A;   CARDIAC CATHETERIZATION N/A 11/28/2015   Procedure: Coronary Balloon Angioplasty;  Surgeon: [name], MD;  Location: [place] [facility];  Service: Cardiovascular;  Laterality: N/A;  ostial LAD   CARDIAC CATHETERIZATION N/A 11/28/2015   Procedure: Coronary/Graft Angiography;  Surgeon: [name], MD;  Location: [place] [facility];  Service: Cardiovascular;  Laterality: N/A;  coronaries only    CARDIAC CATHETERIZATION N/A 04/21/2016   Procedure: Left Heart Cath and Coronary Angiography;  Surgeon: Wellington Hampshire, MD;  Location: [place] [facility];  Service: Cardiovascular;   Laterality: N/A;   CARDIAC CATHETERIZATION N/A 06/14/2016   Procedure: Left Heart Cath and Cors/Grafts Angiography;  Surgeon: Lorretta Harp, MD;  Location: [place] [facility];  Service: Cardiovascular;  Laterality: N/A;   CARDIAC CATHETERIZATION N/A 09/08/2016   Procedure: Left Heart Cath and Cors/Grafts Angiography;  Surgeon: Wellington Hampshire, MD;  Location: [place] [facility];  Service: Cardiovascular;  Laterality: N/A;   CARDIAC CATHETERIZATION     CORONARY ARTERY BYPASS GRAFT N/A 11/28/2015   Procedure: CORONARY ARTERY BYPASS GRAFTING (CABG) TIMES FIVE USING LEFT INTERNAL MAMMARY ARTERY AND RIGHT GREATER SAPHENOUS,VIEN HARVEATED BY ENDOVIEN, INTRAOPPRATIVE TEE;  Surgeon: Gaye Pollack, MD;  Location: [place];  Service: Open Heart Surgery;  Laterality: N/A;   CORONARY STENT INTERVENTION N/A 10/05/2017   Procedure: CORONARY STENT INTERVENTION;  Surgeon: [name], MD;  Location: [place] [facility];  Service: Cardiovascular;  Laterality: N/A;   ESOPHAGOGASTRODUODENOSCOPY (EGD) WITH PROPOFOL N/A 07/20/2019   Procedure: ESOPHAGOGASTRODUODENOSCOPY (EGD) WITH PROPOFOL;  Surgeon: Carol Ada, MD;  [facility];  Service: Endoscopy;  Laterality: N/A;   ESOPHAGOGASTRODUODENOSCOPY (EGD) WITH PROPOFOL N/A 01/30/2021   Procedure: ESOPHAGOGASTRODUODENOSCOPY (EGD) WITH PROPOFOL;  Surgeon: Carol Ada, MD;  [facility];  Service: Endoscopy;  Laterality: N/A;   FIDUCIAL MARKER PLACEMENT  12/24/2020   Procedure: FIDUCIAL DYE MARKER PLACEMENT;  Surgeon: Garner Nash, DO;  Location: [place] [facility];  Service: Pulmonary;;   HUMERUS SURGERY Right 1969   "tumor inside bone; filled it w/bone chips"   INTERCOSTAL NERVE BLOCK Right 12/24/2020   Procedure: INTERCOSTAL NERVE BLOCK;  Surgeon: [name], MD;  Location: [place];  Service: Thoracic;  Laterality: Right;   IR IMAGING GUIDED PORT INSERTION  02/06/2021   LEFT HEART CATH AND CORS/GRAFTS ANGIOGRAPHY N/A 03/11/2017    Procedure: Left Heart Cath and Cors/Grafts Angiography;  Surgeon: Leonie Man, MD;  Location: [place] [facility];  Service: Cardiovascular;  Laterality: N/A;   LEFT HEART CATH AND CORS/GRAFTS ANGIOGRAPHY N/A 10/05/2017   Procedure: LEFT HEART CATH AND CORS/GRAFTS ANGIOGRAPHY;  Surgeon: [name], MD;  Location: [place] [facility];  Service: Cardiovascular;  Laterality: N/A;   LEFT HEART CATH AND CORS/GRAFTS ANGIOGRAPHY N/A 04/11/2019   Procedure: LEFT HEART CATH AND CORS/GRAFTS ANGIOGRAPHY;  Surgeon: [name], MD;  Location: [place] [facility];  Service: Cardiovascular;  Laterality: N/A;   NODE DISSECTION Right 12/24/2020   Procedure: NODE DISSECTION;  Surgeon: [name], MD;  Location: [place];  Service: Thoracic;  Laterality: Right;   PERIPHERAL VASCULAR CATHETERIZATION N/A 06/14/2016   Procedure: Lower Extremity Angiography;  Surgeon: Lorretta Harp, MD;  Location: [place] [facility];  Service: Cardiovascular;  Laterality: N/A;   VIDEO BRONCHOSCOPY WITH ENDOBRONCHIAL NAVIGATION Right 12/24/2020   Procedure: VIDEO BRONCHOSCOPY WITH ENDOBRONCHIAL NAVIGATION;  Surgeon: Garner Nash, DO;  Location: [facility];  Service: Pulmonary;  Laterality: Right;   VIDEO BRONCHOSCOPY WITH ENDOBRONCHIAL ULTRASOUND N/A 12/24/2020   Procedure: VIDEO BRONCHOSCOPY WITH ENDOBRONCHIAL ULTRASOUND;  Surgeon: Garner Nash, DO;  Location: [place];  Service: Pulmonary;  Laterality: N/A;   VIDEO BRONCHOSCOPY WITH INSERTION OF INTERBRONCHIAL VALVE (IBV) N/A 12/31/2020   Procedure: VIDEO BRONCHOSCOPY WITH INSERTION OF INTERBRONCHIAL VALVE (IBV).VALVE IN CARTRIDGE 65mm,9mm. CHEST TUBE PLACEMENT.;  Surgeon: [name], MD;  Location: [facility];  Service: Thoracic;  Laterality: N/A;    Allergies  Allergies  Allergen Reactions   Prednisone Other (See Comments)    States that this med makes him "crazy"   Tetanus Toxoids Swelling and Other (See Comments)    Fever, Swelling of  the arm    Wellbutrin [Bupropion] Other (See Comments)    Crazy thoughts, nightmares   Indomethacin Other (See Comments)    rectal bleeding   Other     Other reaction(s): Unknown   Varenicline Other (See Comments)    Dreams Other reaction(s): Unknown Other reaction(s): Unknown    History of Present Illness    [name] is a 65 y.o. 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 Multiple episodes of chest pain since that time often attributed to anxiety. Mulitple caths 2018 and stress test with no PCI. 09/2017 cath and PCI of RCA due to narrowing of the graft of the distal RCA. Echo 08/2018 with LVEF 55-60%, no valvular abnormalities. CTA with mild prominence of ascending aorta 3.9x3.9cm. Cardiac cath 03/2019 due to chest pain with patent LIMA-LAD/SVG-OM, SVG-PDA patent with proximal disease which was unchanged, patent stent to RCA, graft to diagonal occluded, normal LVEF. Recommended for medical therapy.   He had COVID 09/2020 but was not hospitalized. ED visit 09/2020 for chest pain with CT concerning for primary bronchogenic carcinoma. 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 He underwent stereotactic radiosurgery, right lobectomy, and chemotherapy. Chemotherapy was difficult to tolerate. Admitted 11/2020 with right upper lobectomy and developed atrial fibrillation with RVR. Monitor at discharge with no recurrent atrial fibrillation. As he was maintaining NSR, anticoagulation was deferred on discharge. ED visit 04/26/21 with diagnosis of PE started on Xarelto. Hospitalized 03/2021 with anemia requiring 1u PRBC. Last seen in clinic 05/13/21, no changes were made  at that time.   Admitted 06/06/21-06/11/21 with hemoptysis without anemia, right sided chest pain presumed due to coughing, orthostatic hypotension. Antihypertensives were held and Midodrine 2.5mg  TID initiated.   He was seen 06/15/2021 with his daughter.  He was grateful to be done with chemotherapy.  He noted lightheadedness and symptoms consistent with orthostasis.  Carotid duplex as well as a monitor were ordered.  He presents today for follow-up. ***   EKGs/Labs/Other Studies Reviewed:   The following studies were reviewed today:  Echo 12/27/20 1. Left ventricular ejection fraction, by estimation, is 55 to 60%. The  left ventricle has normal function. The left ventricle has no regional  wall motion abnormalities. Left ventricular diastolic parameters are  consistent with Grade I diastolic  dysfunction (impaired relaxation).   2. Right ventricular systolic function is normal. The right ventricular  size is normal.   3. The mitral valve is normal in structure. No evidence  of mitral valve  regurgitation. No evidence of mitral stenosis.   4. The aortic valve is normal in structure. Aortic valve regurgitation is  not visualized. No aortic stenosis is present.   5. The inferior vena cava is normal in size with greater than 50%  respiratory variability, suggesting right atrial pressure of 3 mmHg.   Monitor 02/06/21 Normal sinus rhythm with rare PACs and PVCs. Brief, string of PACs not associated with symptoms. No sustained pathologic arrhythmias.     Patch Wear Time:  14 days and 0 hours (2022-05-14T12:06:36-0400 to 2022-05-28T12:06:36-0400)   Patient had a min HR of 45 bpm, max HR of 203 bpm, and avg HR of 82 bpm. Predominant underlying rhythm was Sinus Rhythm. 1 run of Supraventricular Tachycardia occurred lasting 6 beats with a max rate of 203 bpm (avg 199 bpm). Isolated SVEs were rare  (<1.0%), SVE Couplets were rare (<1.0%), and SVE Triplets were rare (<1.0%). Isolated VEs were  rare (<1.0%), and no VE Couplets or VE Triplets were present.    CT 06/04/21 FINDINGS: Cardiovascular: Satisfactory opacification of the pulmonary arteries to the segmental level. No evidence of pulmonary embolism. Normal heart size. No pericardial effusion. Atherosclerosis of thoracic aorta is noted without aneurysm formation. Status post coronary bypass graft.   Mediastinum/Nodes: No enlarged mediastinal, hilar, or axillary lymph nodes. Thyroid gland, trachea, and esophagus demonstrate no significant findings.   Lungs/Pleura: No pneumothorax is noted. Left lung is clear. Stable right upper lobe scarring is noted. Minimal right pleural effusion is noted with adjacent subsegmental atelectasis.   Upper Abdomen: No acute abnormality.   Musculoskeletal: No chest wall abnormality. No acute or significant osseous findings.   Review of the MIP images confirms the above findings.   IMPRESSION: No definite evidence of pulmonary embolus.   Minimal right pleural effusion is noted with minimal adjacent subsegmental atelectasis.   Aortic Atherosclerosis (ICD10-I70.0).  EKG:  No EKG today  Recent Labs: 12/26/2020: TSH 0.847 06/06/2021: ALT 16; B Natriuretic Peptide 83.3 06/11/2021: BUN 22; Creatinine, Ser 1.06; Hemoglobin 10.7; Magnesium 2.0; Platelets 201; Potassium 4.3; Sodium 142  Recent Lipid Panel    Component Value Date/Time   CHOL 82 12/27/2020 0125   CHOL 114 11/08/2017 0833   TRIG 97 12/27/2020 0125   HDL 35 (L) 12/27/2020 0125   HDL 52 11/08/2017 0833   CHOLHDL 2.3 12/27/2020 0125   VLDL 19 12/27/2020 0125   LDLCALC 28 12/27/2020 0125   LDLCALC 43 11/08/2017 0833    Home Medications   No outpatient medications have been marked as taking for the 06/24/21 encounter (Appointment) with Loel Dubonnet, NP.     Review of Systems      All other systems reviewed and are otherwise negative except as noted above.  Physical Exam    VS:  There were no vitals taken for  this visit. , BMI There is no height or weight on file to calculate BMI.  Wt Readings from Last 3 Encounters:  06/22/21 170 lb 4 oz (77.2 kg)  06/15/21 171 lb 11.2 oz (77.9 kg)  06/06/21 167 lb (75.8 kg)    GEN: Thin appearing, well developed, in no acute distress. HEENT: normal. Neck: Supple, no JVD, carotid bruits, or masses. Cardiac: RRR, no murmurs, rubs, or gallops. No clubbing, cyanosis, edema.  Radials/PT 2+ and equal bilaterally.  Respiratory:  Respirations regular and unlabored, clear to auscultation bilaterally. GI: Soft, nontender, nondistended. MS: No deformity or atrophy. Skin: Warm and dry, no rash. Neuro:  Strength and  sensation are intact. Psych: Normal affect.  Assessment & Plan    CAD s/p CABG and DES to RCA - Stable with no anginal symptoms. No indication for ischemic evaluation.  GDMT includes Atorvastatin 80mg  QD, Metoprolol succinate 25mg  QD, PRN nitroglycerin. No aspirin due to chronic anticoagulation. Heart healthy diet and regular cardiovascular exercise encouraged.  ***  Anemia - Continue to follow with hematology. Reports no melena nor hematuria. ***  PAF - Maintaining NSR by auscultation today. Isolated episode in setting of postop lobectomy 11/2020. Has not been maintained on anticoagulation for atrial fib. Continue current dose Metoprolol. Given reports of recent tachycardia up to 150bpm with ambulation will place 14 day ZIO to rule out recurrent atrial fibrillation. ***  HLD, LDL goal <70 - Continue Atorvastatin 80mg  QD. Denies myalgias.  PE - Diagnosed 03/2021. Appropriately anticoagulated. Follows with oncology. ***  HTN / Orthostatic hypotension- Echo 11/2020 with normal LVEF and no significant valvular abnormalities. Increased hydration, slow position changes, increased hydration encouraged. Continue Midodrine 2.5mg  TID. Refill provided. Home monitoring of BP encouraged. Hopeful to be able to eventually wean of Midodrine as hydration, appetite, blood  pressure improve. Plan for carotid duplex to rule out significant carotid stenosis as contributory to lightheadedness. ***  Disposition: Follow up as scheduled with Dr. Irish Lack.  Signed, Loel Dubonnet, NP 06/24/2021, 7:46 AM [place]

## 2021-06-25 ENCOUNTER — Ambulatory Visit: Payer: Medicare Other | Admitting: Physician Assistant

## 2021-06-25 ENCOUNTER — Encounter (HOSPITAL_BASED_OUTPATIENT_CLINIC_OR_DEPARTMENT_OTHER): Payer: Self-pay | Admitting: Urology

## 2021-06-25 ENCOUNTER — Other Ambulatory Visit: Payer: Self-pay

## 2021-06-25 ENCOUNTER — Emergency Department (HOSPITAL_BASED_OUTPATIENT_CLINIC_OR_DEPARTMENT_OTHER): Payer: Medicare Other

## 2021-06-25 DIAGNOSIS — I2510 Atherosclerotic heart disease of native coronary artery without angina pectoris: Secondary | ICD-10-CM | POA: Insufficient documentation

## 2021-06-25 DIAGNOSIS — N183 Chronic kidney disease, stage 3 unspecified: Secondary | ICD-10-CM | POA: Insufficient documentation

## 2021-06-25 DIAGNOSIS — R112 Nausea with vomiting, unspecified: Secondary | ICD-10-CM | POA: Insufficient documentation

## 2021-06-25 DIAGNOSIS — Z7901 Long term (current) use of anticoagulants: Secondary | ICD-10-CM | POA: Insufficient documentation

## 2021-06-25 DIAGNOSIS — Z20822 Contact with and (suspected) exposure to covid-19: Secondary | ICD-10-CM | POA: Insufficient documentation

## 2021-06-25 DIAGNOSIS — Z79899 Other long term (current) drug therapy: Secondary | ICD-10-CM | POA: Diagnosis not present

## 2021-06-25 DIAGNOSIS — R072 Precordial pain: Secondary | ICD-10-CM | POA: Diagnosis not present

## 2021-06-25 DIAGNOSIS — Z955 Presence of coronary angioplasty implant and graft: Secondary | ICD-10-CM | POA: Diagnosis not present

## 2021-06-25 DIAGNOSIS — R0781 Pleurodynia: Secondary | ICD-10-CM | POA: Insufficient documentation

## 2021-06-25 DIAGNOSIS — I480 Paroxysmal atrial fibrillation: Secondary | ICD-10-CM | POA: Insufficient documentation

## 2021-06-25 DIAGNOSIS — Z85828 Personal history of other malignant neoplasm of skin: Secondary | ICD-10-CM | POA: Insufficient documentation

## 2021-06-25 DIAGNOSIS — R0789 Other chest pain: Secondary | ICD-10-CM | POA: Diagnosis present

## 2021-06-25 DIAGNOSIS — Z87891 Personal history of nicotine dependence: Secondary | ICD-10-CM | POA: Diagnosis not present

## 2021-06-25 DIAGNOSIS — R1011 Right upper quadrant pain: Secondary | ICD-10-CM | POA: Insufficient documentation

## 2021-06-25 DIAGNOSIS — I1310 Hypertensive heart and chronic kidney disease without heart failure, with stage 1 through stage 4 chronic kidney disease, or unspecified chronic kidney disease: Secondary | ICD-10-CM | POA: Insufficient documentation

## 2021-06-25 DIAGNOSIS — I129 Hypertensive chronic kidney disease with stage 1 through stage 4 chronic kidney disease, or unspecified chronic kidney disease: Secondary | ICD-10-CM | POA: Insufficient documentation

## 2021-06-25 NOTE — ED Triage Notes (Signed)
Right sided rib pain that radiates from front to back x 1 week, h/o lung CA.  Reports SOB with exertion.  States increased HR.  HR 132 at this time.

## 2021-06-26 ENCOUNTER — Emergency Department (HOSPITAL_BASED_OUTPATIENT_CLINIC_OR_DEPARTMENT_OTHER): Payer: Medicare Other

## 2021-06-26 ENCOUNTER — Emergency Department (HOSPITAL_BASED_OUTPATIENT_CLINIC_OR_DEPARTMENT_OTHER): Payer: Medicare Other | Admitting: Radiology

## 2021-06-26 ENCOUNTER — Emergency Department (HOSPITAL_BASED_OUTPATIENT_CLINIC_OR_DEPARTMENT_OTHER)
Admission: EM | Admit: 2021-06-26 | Discharge: 2021-06-26 | Disposition: A | Discharge status: Home / Self Care | Payer: Medicare Other | Attending: Emergency Medicine | Admitting: Emergency Medicine

## 2021-06-26 ENCOUNTER — Encounter (HOSPITAL_BASED_OUTPATIENT_CLINIC_OR_DEPARTMENT_OTHER): Payer: Self-pay | Admitting: Emergency Medicine

## 2021-06-26 ENCOUNTER — Other Ambulatory Visit: Payer: Self-pay

## 2021-06-26 ENCOUNTER — Emergency Department (HOSPITAL_BASED_OUTPATIENT_CLINIC_OR_DEPARTMENT_OTHER)
Admission: EM | Admit: 2021-06-26 | Discharge: 2021-06-26 | Disposition: A | Discharge status: Home / Self Care | Payer: Medicare Other | Source: Home / Self Care | Attending: Emergency Medicine | Admitting: Emergency Medicine

## 2021-06-26 ENCOUNTER — Telehealth: Payer: Self-pay | Admitting: Interventional Cardiology

## 2021-06-26 DIAGNOSIS — Z87891 Personal history of nicotine dependence: Secondary | ICD-10-CM | POA: Insufficient documentation

## 2021-06-26 DIAGNOSIS — I2510 Atherosclerotic heart disease of native coronary artery without angina pectoris: Secondary | ICD-10-CM | POA: Insufficient documentation

## 2021-06-26 DIAGNOSIS — R0789 Other chest pain: Secondary | ICD-10-CM

## 2021-06-26 DIAGNOSIS — Z7901 Long term (current) use of anticoagulants: Secondary | ICD-10-CM | POA: Insufficient documentation

## 2021-06-26 DIAGNOSIS — R072 Precordial pain: Secondary | ICD-10-CM | POA: Insufficient documentation

## 2021-06-26 DIAGNOSIS — Z79899 Other long term (current) drug therapy: Secondary | ICD-10-CM | POA: Insufficient documentation

## 2021-06-26 DIAGNOSIS — R0781 Pleurodynia: Secondary | ICD-10-CM | POA: Diagnosis not present

## 2021-06-26 DIAGNOSIS — I480 Paroxysmal atrial fibrillation: Secondary | ICD-10-CM | POA: Insufficient documentation

## 2021-06-26 DIAGNOSIS — N183 Chronic kidney disease, stage 3 unspecified: Secondary | ICD-10-CM | POA: Insufficient documentation

## 2021-06-26 DIAGNOSIS — Z951 Presence of aortocoronary bypass graft: Secondary | ICD-10-CM | POA: Insufficient documentation

## 2021-06-26 DIAGNOSIS — I1310 Hypertensive heart and chronic kidney disease without heart failure, with stage 1 through stage 4 chronic kidney disease, or unspecified chronic kidney disease: Secondary | ICD-10-CM | POA: Insufficient documentation

## 2021-06-26 DIAGNOSIS — Z85828 Personal history of other malignant neoplasm of skin: Secondary | ICD-10-CM | POA: Insufficient documentation

## 2021-06-26 DIAGNOSIS — I10 Essential (primary) hypertension: Secondary | ICD-10-CM

## 2021-06-26 DIAGNOSIS — R000 Tachycardia, unspecified: Secondary | ICD-10-CM | POA: Insufficient documentation

## 2021-06-26 LAB — RESP PANEL BY RT-PCR (FLU A&B, COVID) ARPGX2
Influenza A by PCR: NEGATIVE
Influenza B by PCR: NEGATIVE
SARS Coronavirus 2 by RT PCR: NEGATIVE

## 2021-06-26 LAB — CBC WITH DIFFERENTIAL/PLATELET
Abs Immature Granulocytes: 0.05 10*3/uL (ref 0.00–0.07)
Abs Immature Granulocytes: 0.06 10*3/uL (ref 0.00–0.07)
Basophils Absolute: 0 10*3/uL (ref 0.0–0.1)
Basophils Absolute: 0 10*3/uL (ref 0.0–0.1)
Basophils Relative: 0 %
Basophils Relative: 0 %
Eosinophils Absolute: 0.1 10*3/uL (ref 0.0–0.5)
Eosinophils Absolute: 0.1 10*3/uL (ref 0.0–0.5)
Eosinophils Relative: 1 %
Eosinophils Relative: 1 %
HCT: 37.8 % — ABNORMAL LOW (ref 39.0–52.0)
HCT: 35.2 % — ABNORMAL LOW (ref 39.0–52.0)
Hemoglobin: 12.1 g/dL — ABNORMAL LOW (ref 13.0–17.0)
Hemoglobin: 11.5 g/dL — ABNORMAL LOW (ref 13.0–17.0)
Immature Granulocytes: 0 %
Immature Granulocytes: 1 %
Lymphocytes Relative: 16 %
Lymphocytes Relative: 16 %
Lymphs Abs: 1.9 10*3/uL (ref 0.7–4.0)
Lymphs Abs: 1.7 10*3/uL (ref 0.7–4.0)
MCH: 31.3 pg (ref 26.0–34.0)
MCH: 31.8 pg (ref 26.0–34.0)
MCHC: 32 g/dL (ref 30.0–36.0)
MCHC: 32.7 g/dL (ref 30.0–36.0)
MCV: 97.7 fL (ref 80.0–100.0)
MCV: 97.2 fL (ref 80.0–100.0)
Monocytes Absolute: 0.7 10*3/uL (ref 0.1–1.0)
Monocytes Absolute: 0.9 10*3/uL (ref 0.1–1.0)
Monocytes Relative: 6 %
Monocytes Relative: 8 %
Neutro Abs: 8.7 10*3/uL — ABNORMAL HIGH (ref 1.7–7.7)
Neutro Abs: 8 10*3/uL — ABNORMAL HIGH (ref 1.7–7.7)
Neutrophils Relative %: 77 %
Neutrophils Relative %: 74 %
Platelets: 191 10*3/uL (ref 150–400)
Platelets: 171 10*3/uL (ref 150–400)
RBC: 3.87 MIL/uL — ABNORMAL LOW (ref 4.22–5.81)
RBC: 3.62 MIL/uL — ABNORMAL LOW (ref 4.22–5.81)
RDW: 13.2 % (ref 11.5–15.5)
RDW: 13.2 % (ref 11.5–15.5)
WBC: 11.5 10*3/uL — ABNORMAL HIGH (ref 4.0–10.5)
WBC: 10.7 10*3/uL — ABNORMAL HIGH (ref 4.0–10.5)
nRBC: 0 % (ref 0.0–0.2)
nRBC: 0 % (ref 0.0–0.2)

## 2021-06-26 LAB — TROPONIN I (HIGH SENSITIVITY)
Troponin I (High Sensitivity): 4 ng/L (ref <18)
Troponin I (High Sensitivity): 5 ng/L (ref <18)

## 2021-06-26 LAB — COMPREHENSIVE METABOLIC PANEL
ALT: 22 U/L (ref 0–44)
ALT: 15 U/L (ref 0–44)
AST: 19 U/L (ref 15–41)
AST: 14 U/L — ABNORMAL LOW (ref 15–41)
Albumin: 4.3 g/dL (ref 3.5–5.0)
Albumin: 4.3 g/dL (ref 3.5–5.0)
Alkaline Phosphatase: 94 U/L (ref 38–126)
Alkaline Phosphatase: 79 U/L (ref 38–126)
Anion gap: 8 (ref 5–15)
Anion gap: 9 (ref 5–15)
BUN: 27 mg/dL — ABNORMAL HIGH (ref 8–23)
BUN: 21 mg/dL (ref 8–23)
CO2: 25 mmol/L (ref 22–32)
CO2: 27 mmol/L (ref 22–32)
Calcium: 9.7 mg/dL (ref 8.9–10.3)
Calcium: 9.7 mg/dL (ref 8.9–10.3)
Chloride: 106 mmol/L (ref 98–111)
Chloride: 104 mmol/L (ref 98–111)
Creatinine, Ser: 1.07 mg/dL (ref 0.61–1.24)
Creatinine, Ser: 0.99 mg/dL (ref 0.61–1.24)
GFR, Estimated: >60 mL/min (ref >60)
GFR, Estimated: >60 mL/min (ref >60)
Glucose, Bld: 103 mg/dL — ABNORMAL HIGH (ref 70–99)
Glucose, Bld: 90 mg/dL (ref 70–99)
Potassium: 4.4 mmol/L (ref 3.5–5.1)
Potassium: 4.4 mmol/L (ref 3.5–5.1)
Sodium: 139 mmol/L (ref 135–145)
Sodium: 140 mmol/L (ref 135–145)
Total Bilirubin: 0.5 mg/dL (ref 0.3–1.2)
Total Bilirubin: 0.7 mg/dL (ref 0.3–1.2)
Total Protein: 7.8 g/dL (ref 6.5–8.1)
Total Protein: 7.1 g/dL (ref 6.5–8.1)

## 2021-06-26 LAB — GROUP A STREP BY PCR: Group A Strep by PCR: NOT DETECTED

## 2021-06-26 MED ORDER — HYDROMORPHONE HCL 1 MG/ML IJ SOLN
1.0000 mg | Freq: Once | INTRAMUSCULAR | Status: AC
Start: 2021-06-26 — End: 2021-06-26
  Administered 2021-06-26: 1 mg via INTRAVENOUS
  Filled 2021-06-26: qty 1

## 2021-06-26 MED ORDER — LIDOCAINE 5 % EX PTCH
1.0000 | MEDICATED_PATCH | CUTANEOUS | Status: DC
  Administered 2021-06-26: 1 via TRANSDERMAL
  Filled 2021-06-26: qty 1

## 2021-06-26 MED ORDER — OXYCODONE HCL 5 MG PO TABS: 5.0000 mg | ORAL_TABLET | Freq: Four times a day (QID) | ORAL | 0 refills | Status: DC | PRN

## 2021-06-26 MED ORDER — ACETAMINOPHEN 325 MG PO TABS
650.0000 mg | ORAL_TABLET | Freq: Once | ORAL | Status: AC
  Administered 2021-06-26: 650 mg via ORAL

## 2021-06-26 MED ORDER — ACETAMINOPHEN 325 MG PO TABS
ORAL_TABLET | ORAL | Status: AC
  Filled 2021-06-26: qty 2

## 2021-06-26 MED ORDER — LIDOCAINE 5 % EX PTCH: 1.0000 | MEDICATED_PATCH | CUTANEOUS | 0 refills | Status: DC

## 2021-06-26 MED ORDER — OXYCODONE HCL 5 MG PO TABS
5.0000 mg | ORAL_TABLET | Freq: Once | ORAL | Status: AC
  Administered 2021-06-26: 5 mg via ORAL
  Filled 2021-06-26: qty 1

## 2021-06-26 MED ORDER — OXYCODONE-ACETAMINOPHEN 5-325 MG PO TABS
1.0000 | ORAL_TABLET | Freq: Once | ORAL | Status: DC
  Filled 2021-06-26: qty 1

## 2021-06-26 MED ORDER — IOHEXOL 350 MG/ML SOLN
80.0000 mL | Freq: Once | INTRAVENOUS | Status: AC | PRN
  Administered 2021-06-26: 80 mL via INTRAVENOUS

## 2021-06-26 MED ORDER — MORPHINE SULFATE (PF) 2 MG/ML IV SOLN
2.0000 mg | Freq: Once | INTRAVENOUS | Status: AC
  Administered 2021-06-26: 2 mg via INTRAVENOUS
  Filled 2021-06-26: qty 1

## 2021-06-26 MED ORDER — OXYCODONE-ACETAMINOPHEN 5-325 MG PO TABS
1.0000 | ORAL_TABLET | Freq: Once | ORAL | Status: AC
  Administered 2021-06-26: 1 via ORAL
  Filled 2021-06-26: qty 1

## 2021-06-26 MED ORDER — METOPROLOL TARTRATE 25 MG PO TABS
25.0000 mg | ORAL_TABLET | Freq: Once | ORAL | Status: AC
  Administered 2021-06-26: 25 mg via ORAL
  Filled 2021-06-26: qty 1

## 2021-06-26 NOTE — Telephone Encounter (Signed)
Pt c/o BP issue: STAT if pt c/o blurred vision, one-sided weakness or slurred speech  1. What are your last 5 BP readings?  170/113 PULSE 153 NOW 165/113 PULSE 170 174/110 PULSE 153     2. Are you having any other symptoms (ex. Dizziness, headache, blurred vision, passed out)? SOB LIGHTHEADED  3. What is your BP issue? PT WAS JUST RELEASED FROM  [facility] THIS MORNING AT 6 AM. PT'S BP IS STILL HIGH. PT WANTS TO KNOW IF HE SHOULD RETURN TO THE ED. PT IS ALSO STILL EXPERIENCING RIB PAIN.

## 2021-06-26 NOTE — Telephone Encounter (Signed)
Pt advised to go to the [facility] for evaluation per Dr. Harrington Challenger. Discussed the importance of needing to know if he is currently in afib vs  some other form of tachycardia. Patient verbalized understanding and agreeable to plan.

## 2021-06-26 NOTE — Telephone Encounter (Signed)
Pt calls in reporting elevated BPs/HRs for several hours now. Taken while on phone with me and currently 160/111, HR 110 Pt does have a hx of cancer, last chemo tx was end of August.  Does have some pain around ribs, right side "sort of where incisions were" and does have ongoing back pain issues. Pain level is currently 6, was 9 last night when he went to ED.   Midodrine was started beginning of the month when he was experiencing HYPOtension in the hospital. Pt has not taken his Midodrine d/t BP numbers. Usually takes his Toprol at dinnertime. Aware I will discuss this with DOD and let him know advisement. Patient verbalized understanding and agreeable to plan.

## 2021-06-26 NOTE — ED Triage Notes (Addendum)
Concerned for high BP readings and tachycardia last night. C/o ongoing rib pain.

## 2021-06-26 NOTE — ED Notes (Signed)
Pt ambulatory to room, reports shob with ambulation. Pt able to speak in complete sentences.

## 2021-06-26 NOTE — ED Provider Notes (Signed)
[place] EMERGENCY DEPT Provider Note   CSN: [id number] Arrival date & time: 06/26/21  1018     History Chief Complaint  Patient presents with   Hypertension   rib pain    [name] is a 65 y.o. 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  Patient presents emergency department with a chief complaint of right chest wall pain, hypertension, and tachycardia.  Patient reports that he has had constant pain to right lower chest wall over the last 10 days.  Patient describes pain as shooting.  Pain is worse with deep inhalation and touch.  Patient reports minimal improvement with pain medication.  Patient rates pain 10/10 on the pain scale.  Patient reports that he went to emergency department yesterday and after being discharged home noticed he was having tachycardia and shortness of breath with exertion.  Patient also noticed his blood pressure was increased.  Patient denies any associated palpitations, chest pain, lightheadedness, dizziness, or syncope.  Reports that systolic blood pressure was in the 170s and heart rate was in the 150s.   Hypertension Associated symptoms include chest pain and shortness of breath. Pertinent negatives include no abdominal pain and no headaches.      Past Medical History:  Diagnosis Date   Anxiety    Arthritis    Basal cell carcinoma (BCC) of forehead    CAD (coronary artery disease)    a. 10/2015 ant STEMI >> LHC with 3 v CAD; oLAD tx with POBA >> emergent CABG. b. Multiple evals since that time, early graft failure of SVG-RCA by cath 03/2016. c. 2/19 PCI/DES x1 to pRCA, normal EF.   Carotid artery disease ([place])    a. 40-59% BICA 02/2018.   Depression    Dyspnea    Ectopic atrial tachycardia (HCC)    Esophageal reflux    eosinophil esophagitis   Family history of adverse reaction to  anesthesia    "sister has PONV" (06/21/2017)   Former tobacco use    Gout    Hepatitis C    "treated and cured" (06/21/2017)   High cholesterol    History of kidney stones    Hypertension    Ischemic cardiomyopathy    a. EF 25-30% at intraop TEE 4/17  //  b. Limited Echo 5/17 - EF 45-50%, mild ant HK. c. EF 55-65% by cath 09/2017.   Migraine    "3-4/yr" (06/21/2017)   Myocardial infarction ([place]) 10/2015   Palpitations    Sinus bradycardia    a. HR dropping into 40s in 02/2016 -> BB reduced.   Stroke [facility]) 10/2016   "small one; sometimes my memory/cognitive issues" (06/21/2017)   Symptomatic hypotension    a. 02/2016 ER visit -> meds reduced.   Syncope    Wears dentures    Wears glasses     Patient Active Problem List   Diagnosis Date Noted   Hemoptysis 06/06/2021   History of pulmonary embolus (PE) 04/29/2021   Anemia 04/29/2021   Hypotension due to hypovolemia 04/28/2021   Hypomagnesemia 04/08/2021   Port-A-Cath in place 04/07/2021   Protein-calorie malnutrition, severe 03/25/2021   PNA (pneumonia) 03/22/2021   Community acquired pneumonia 03/22/2021   Sepsis ([place]) 03/22/2021   CKD (chronic kidney disease), stage III ([place]) 03/22/2021   Hypoxia 02/28/2021   Dyspnea 02/28/2021   AKI (acute kidney injury) ([place]) 02/25/2021   AF (paroxysmal atrial fibrillation) (  [place]) 02/25/2021   Pleural effusion 02/25/2021   Neutropenia, drug-induced ([place]) 02/24/2021   Positive blood cultures 02/17/2021   Positive blood culture 02/16/2021   Right flank pain 02/09/2021   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[place]) 01/30/2021   Encounter for antineoplastic chemotherapy 01/29/2021   Goals of care, counseling/discussion 01/14/2021   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[place]) 01/14/2021   Acute back pain with sciatica 01/12/2021   Allergic rhinitis 01/12/2021   Amnesia 01/12/2021   Kidney stone 01/12/2021   Sciatica  01/12/2021   Bipolar disorder ([place]) 01/12/2021   S/P lobectomy of lung 12/24/2020   Solitary lung nodule 11/28/2020   Chest pain 11/07/2019   Gastroesophageal reflux disease    Cervical radiculopathy 10/17/2018   Preoperative clearance 10/04/2018   Palpitations    Coronary artery disease involving coronary bypass graft of native heart with angina pectoris ([place])    Transient loss of consciousness 03/24/2018   Ectopic atrial tachycardia ([place]) 02/09/2018   Central chest pain 03/10/2017   Family hx-stroke 11/10/2016   Stroke-like episode ([place]) - R brain, s/p tPA 11/09/2016   Unstable angina ([place]) 09/07/2016   Claudication of both lower extremities ([place])    Pure hypercholesterolemia    Tobacco abuse disorder    CAD of autologous artery bypass graft without angina    Chest pain at rest 06/10/2016   Abnormal nuclear stress test - HIGH RISK 04/20/2016   Old MI (myocardial infarction)    Essential hypertension 02/26/2016   Ischemic cardiomyopathy 12/25/2015   Hyperlipidemia LDL goal <70 12/25/2015   Mild tobacco abuse in early remission 11/28/2015   Coronary artery disease involving native coronary artery of native heart with angina pectoris ([place]) 11/28/2015   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   Acute MI anterior wall first episode care [facility])    Precordial chest pain 03/07/2015   Gout attack 03/07/2015   Mixed bipolar I disorder ([place]) 03/07/2015   Fibromyalgia 07/09/2014   Gout 07/09/2014   Anxiety 07/09/2014   Depression 07/09/2014   Hepatitis C 33/83/2919   Eosinophilic esophagitis 16/60/6004    Past Surgical History:  Procedure Laterality Date   25 GAUGE PARS PLANA VITRECTOMY WITH 20 GAUGE MVR PORT  12/31/2020   Procedure: 25 GAUGE PARS PLANA VITRECTOMY WITH 20 GAUGE MVR PORT;  Surgeon: [name], MD;  [facility];  Service: Thoracic;;   ANTERIOR CERVICAL DECOMP/DISCECTOMY FUSION N/A 10/17/2018   Procedure: Anterior Cervical Decompression Fusion - Cervical seven -Thoracic  one;  Surgeon: Consuella Lose, MD;  Location: [place];  Service: Neurosurgery;  Laterality: N/A;   BASAL CELL CARCINOMA EXCISION     "forehead   BIOPSY  07/20/2019   Procedure: BIOPSY;  Surgeon: Carol Ada, MD;  [facility];  Service: Endoscopy;;   BRONCHIAL BIOPSY  12/24/2020   Procedure: BRONCHIAL BIOPSIES;  Surgeon: Garner Nash, DO;  Location: [place] ENDOSCOPY;  Service: Pulmonary;;   BRONCHIAL BRUSHINGS  12/24/2020   Procedure: BRONCHIAL BRUSHINGS;  Surgeon: Garner Nash, DO;  Location: [place];  Service: Pulmonary;;   BRONCHIAL NEEDLE ASPIRATION BIOPSY  12/24/2020   Procedure: BRONCHIAL NEEDLE ASPIRATION BIOPSIES;  Surgeon: Garner Nash, DO;  Location: [place];  Service: Pulmonary;;   CARDIAC CATHETERIZATION N/A 11/28/2015   Procedure: Left Heart Cath and Coronary Angiography;  Surgeon: [name], MD;  Location: [place] CV LAB;  Service: Cardiovascular;  Laterality: N/A;   CARDIAC CATHETERIZATION N/A 11/28/2015   Procedure: Coronary  Balloon Angioplasty;  Surgeon: [name], MD;  Location: [place] CV LAB;  Service: Cardiovascular;  Laterality: N/A;  ostial LAD   CARDIAC CATHETERIZATION N/A 11/28/2015   Procedure: Coronary/Graft Angiography;  Surgeon: [name], MD;  Location: [place] CV LAB;  Service: Cardiovascular;  Laterality: N/A;  coronaries only    CARDIAC CATHETERIZATION N/A 04/21/2016   Procedure: Left Heart Cath and Coronary Angiography;  Surgeon: Wellington Hampshire, MD;  Location: [place] CV LAB;  Service: Cardiovascular;  Laterality: N/A;   CARDIAC CATHETERIZATION N/A 06/14/2016   Procedure: Left Heart Cath and Cors/Grafts Angiography;  Surgeon: Lorretta Harp, MD;  Location: [place] CV LAB;  Service: Cardiovascular;  Laterality: N/A;   CARDIAC CATHETERIZATION N/A 09/08/2016   Procedure: Left Heart Cath and Cors/Grafts Angiography;  Surgeon: Wellington Hampshire, MD;  Location: [place] CV LAB;  Service:  Cardiovascular;  Laterality: N/A;   CARDIAC CATHETERIZATION     CORONARY ARTERY BYPASS GRAFT N/A 11/28/2015   Procedure: CORONARY ARTERY BYPASS GRAFTING (CABG) TIMES FIVE USING LEFT INTERNAL MAMMARY ARTERY AND RIGHT GREATER SAPHENOUS,VIEN HARVEATED BY ENDOVIEN, INTRAOPPRATIVE TEE;  Surgeon: Gaye Pollack, MD;  Location: [place];  Service: Open Heart Surgery;  Laterality: N/A;   CORONARY STENT INTERVENTION N/A 10/05/2017   Procedure: CORONARY STENT INTERVENTION;  Surgeon: [name], MD;  Location: [place] CV LAB;  Service: Cardiovascular;  Laterality: N/A;   ESOPHAGOGASTRODUODENOSCOPY (EGD) WITH PROPOFOL N/A 07/20/2019   Procedure: ESOPHAGOGASTRODUODENOSCOPY (EGD) WITH PROPOFOL;  Surgeon: Carol Ada, MD;  [facility];  Service: Endoscopy;  Laterality: N/A;   ESOPHAGOGASTRODUODENOSCOPY (EGD) WITH PROPOFOL N/A 01/30/2021   Procedure: ESOPHAGOGASTRODUODENOSCOPY (EGD) WITH PROPOFOL;  Surgeon: Carol Ada, MD;  [facility];  Service: Endoscopy;  Laterality: N/A;   FIDUCIAL MARKER PLACEMENT  12/24/2020   Procedure: FIDUCIAL DYE MARKER PLACEMENT;  Surgeon: Garner Nash, DO;  Location: [place] [facility]COPY;  Service: Pulmonary;;   HUMERUS SURGERY Right 1969   "tumor inside bone; filled it w/bone chips"   INTERCOSTAL NERVE BLOCK Right 12/24/2020   Procedure: INTERCOSTAL NERVE BLOCK;  Surgeon: [name], MD;  Location: [place];  Service: Thoracic;  Laterality: Right;   IR IMAGING GUIDED PORT INSERTION  02/06/2021   LEFT HEART CATH AND CORS/GRAFTS ANGIOGRAPHY N/A 03/11/2017   Procedure: Left Heart Cath and Cors/Grafts Angiography;  Surgeon: Leonie Man, MD;  Location: [place] CV LAB;  Service: Cardiovascular;  Laterality: N/A;   LEFT HEART CATH AND CORS/GRAFTS ANGIOGRAPHY N/A 10/05/2017   Procedure: LEFT HEART CATH AND CORS/GRAFTS ANGIOGRAPHY;  Surgeon: [name], MD;  Location: [place] CV LAB;  Service: Cardiovascular;  Laterality: N/A;   LEFT  HEART CATH AND CORS/GRAFTS ANGIOGRAPHY N/A 04/11/2019   Procedure: LEFT HEART CATH AND CORS/GRAFTS ANGIOGRAPHY;  Surgeon: [name], MD;  Location: [place] CV LAB;  Service: Cardiovascular;  Laterality: N/A;   NODE DISSECTION Right 12/24/2020   Procedure: NODE DISSECTION;  Surgeon: [name], MD;  Location: [place];  Service: Thoracic;  Laterality: Right;   PERIPHERAL VASCULAR CATHETERIZATION N/A 06/14/2016   Procedure: Lower Extremity Angiography;  Surgeon: Lorretta Harp, MD;  Location: [place] [facility] LAB;  Service: Cardiovascular;  Laterality: N/A;   VIDEO BRONCHOSCOPY WITH ENDOBRONCHIAL NAVIGATION Right 12/24/2020   Procedure: VIDEO BRONCHOSCOPY WITH ENDOBRONCHIAL NAVIGATION;  Surgeon: Garner Nash, DO;  Location: [place];  Service: Pulmonary;  Laterality: Right;   VIDEO BRONCHOSCOPY WITH ENDOBRONCHIAL ULTRASOUND N/A 12/24/2020   Procedure: VIDEO BRONCHOSCOPY  WITH ENDOBRONCHIAL ULTRASOUND;  Surgeon: Garner Nash, DO;  Location: [facility];  Service: Pulmonary;  Laterality: N/A;   VIDEO BRONCHOSCOPY WITH INSERTION OF INTERBRONCHIAL VALVE (IBV) N/A 12/31/2020   Procedure: VIDEO BRONCHOSCOPY WITH INSERTION OF INTERBRONCHIAL VALVE (IBV).VALVE IN CARTRIDGE 35mm,9mm. CHEST TUBE PLACEMENT.;  Surgeon: [name], MD;  [facility];  Service: Thoracic;  Laterality: N/A;       Family History  Problem Relation Age of Onset   Lung cancer Mother    Heart Problems Father    Heart attack Father 54   Stroke Father    Heart failure Father    Heart attack Maternal Grandmother    Stroke Maternal Grandmother    Heart attack Paternal Uncle    Hypertension Brother    Autoimmune disease Neg Hx     Social History   Tobacco Use   Smoking status: Former    Packs/day: 0.75    Years: 44.00    Pack years: 33.00    Types: Cigarettes    Quit date: 11/28/2015    Years since quitting: 5.5   Smokeless tobacco: Never  Vaping Use   Vaping Use: Never used   Substance Use Topics   Alcohol use: Yes    Comment: occ   Drug use: No    Comment: 06/21/2017 "nothing since the 1980s"    Home Medications Prior to Admission medications   Medication Sig Start Date End Date Taking? Authorizing Provider  acetaminophen (TYLENOL) 500 MG tablet Take 1,000 mg by mouth every 6 (six) hours as needed for moderate pain.    [provider]  atorvastatin (LIPITOR) 80 MG tablet TAKE 1 TABLET(80 MG) BY MOUTH DAILY 04/01/21   [name], MD  ferrous gluconate (FERGON) 324 MG tablet Take 1 tablet (324 mg total) by mouth daily with breakfast. 06/11/21 09/09/21  Mariel Aloe, MD  lidocaine (LIDODERM) 5 % Place 1 patch onto the skin daily. Remove & Discard patch within 12 hours or as directed by MD 06/26/21   Quintella Reichert, MD  lidocaine-prilocaine (EMLA) cream Apply 1 application topically once as needed (port access). 02/27/21   Aline August, MD  magnesium oxide (MAG-OX) 400 (240 Mg) MG tablet Take 1 tablet (400 mg total) by mouth daily. 04/07/21   Heilingoetter, Cassandra L, PA-C  metoprolol succinate (TOPROL-XL) 25 MG 24 hr tablet Take 1 tablet (25 mg total) by mouth daily. 08/27/20   [name], MD  midodrine (PROAMATINE) 2.5 MG tablet Take 1 tablet (2.5 mg total) by mouth 3 (three) times daily with meals. 06/15/21 12/12/21  Loel Dubonnet, NP  nitroGLYCERIN (NITROSTAT) 0.4 MG SL tablet PLACE 1 TABLET UNDER THE TONGUE EVERY 5 MINUTES AS NEEDED FOR CHEST PAIN. 3 DOSES MAX 04/16/20   [name], MD  ondansetron (ZOFRAN) 8 MG tablet Take 1 tablet (8 mg total) by mouth every 8 (eight) hours as needed for nausea or vomiting. Starting 3 days after chemotherapy Patient not taking: Reported on 06/15/2021 04/01/21   Heilingoetter, Cassandra L, PA-C  oxyCODONE (ROXICODONE) 5 MG immediate release tablet Take 1 tablet (5 mg total) by mouth every 6 (six) hours as needed for severe pain. 06/26/21   Quintella Reichert, MD  pantoprazole (PROTONIX) 40  MG tablet Take 1 tablet (40 mg total) by mouth daily. Patient not taking: Reported on 06/15/2021 05/05/21   Quintella Reichert, MD  prochlorperazine (COMPAZINE) 10 MG tablet Take 1 tablet (10 mg total) by mouth every 6 (six) hours as needed. Patient  not taking: Reported on 06/15/2021 01/14/21   Heilingoetter, Cassandra L, PA-C  ranolazine (RANEXA) 1000 MG SR tablet Take 1 tablet (1,000 mg total) by mouth 2 (two) times daily. 05/25/21   [name], MD  Tiotropium Bromide-Olodaterol (STIOLTO RESPIMAT) 2.5-2.5 MCG/ACT AERS Inhale 2 puffs into the lungs daily. Patient not taking: Reported on 06/15/2021 01/12/21   Icard, Octavio Graves, DO  tiZANidine (ZANAFLEX) 2 MG tablet Take 1 tablet (2 mg total) by mouth every 8 (eight) hours as needed for muscle spasms. 06/11/21   Mariel Aloe, MD  XARELTO 20 MG TABS tablet Take 20 mg by mouth daily. 06/01/21   [provider]    Allergies    Prednisone, Tetanus toxoids, Wellbutrin [bupropion], Indomethacin, Other, and Varenicline  Review of Systems   Review of Systems  Constitutional:  Negative for chills and fever.  Eyes:  Negative for visual disturbance.  Respiratory:  Positive for shortness of breath. Negative for cough.   Cardiovascular:  Positive for chest pain. Negative for palpitations and leg swelling.  Gastrointestinal:  Negative for abdominal pain, diarrhea, nausea and vomiting.  Genitourinary:  Negative for difficulty urinating.  Musculoskeletal:  Negative for back pain and neck pain.  Skin:  Negative for color change, pallor, rash and wound.  Neurological:  Negative for dizziness, tremors, seizures, syncope, facial asymmetry, speech difficulty, weakness, light-headedness, numbness and headaches.  Psychiatric/Behavioral:  Negative for confusion.    Physical Exam Updated Vital Signs BP (!) 147/86   Pulse 83   Temp 98.7 F (37.1 C) (Oral)   Resp 19   SpO2 100%   Physical Exam Vitals and nursing note reviewed.  Constitutional:       General: He is not in acute distress.    Appearance: He is not ill-appearing, toxic-appearing or diaphoretic.  HENT:     Head: Normocephalic.  Eyes:     General: No scleral icterus.       Right eye: No discharge.        Left eye: No discharge.  Cardiovascular:     Rate and Rhythm: Normal rate.  Pulmonary:     Effort: Pulmonary effort is normal. No tachypnea, bradypnea or respiratory distress.     Breath sounds: Normal breath sounds. No stridor.  Chest:     Chest wall: Tenderness present. No mass, lacerations, deformity, swelling, crepitus or edema.     Comments: Tenderness to anterior and posterior right chest wall. Abdominal:     Palpations: Abdomen is soft. There is no mass or pulsatile mass.     Tenderness: There is no abdominal tenderness. There is no guarding or rebound.     Hernia: There is no hernia in the umbilical area or ventral area.  Skin:    General: Skin is warm and dry.  Neurological:     General: No focal deficit present.     Mental Status: He is alert.     GCS: GCS eye subscore is 4. GCS verbal subscore is 5. GCS motor subscore is 6.     Cranial Nerves: Cranial nerves 2-12 are intact. No cranial nerve deficit, dysarthria or facial asymmetry.     Comments: Patient able to move all limbs equally without any difficulty.  Psychiatric:        Behavior: Behavior is cooperative.    ED Results / Procedures / Treatments   Labs (all labs ordered are listed, but only abnormal results are displayed) Labs Reviewed  COMPREHENSIVE METABOLIC PANEL  CBC WITH DIFFERENTIAL/PLATELET  TROPONIN I (HIGH  SENSITIVITY)  TROPONIN I (HIGH SENSITIVITY)    EKG None  Radiology DG Chest 2 View  Result Date: 06/26/2021 CLINICAL DATA:  Tachycardia and shortness of breath. EXAM: CHEST - 2 VIEW COMPARISON:  Chest x-ray 06/25/2021 FINDINGS: The right-sided power port is stable. The cardiac silhouette, mediastinal and hilar contours are within normal limits. Stable surgical changes  coronary artery bypass surgery. Two small right-sided endobronchial valves are again noted. Small residual pleural fluid or pleural thickening on the right side. No acute pulmonary process. No pleural effusion or pneumothorax. IMPRESSION: No acute cardiopulmonary findings. Electronically Signed   By: Marijo Sanes M.D.   On: 06/26/2021 16:22   DG Ribs Unilateral W/Chest Right  Result Date: 06/25/2021 CLINICAL DATA:  Right side rib pain. History of lung cancer. Shortness of breath EXAM: RIGHT RIBS AND CHEST - 3+ VIEW COMPARISON:  06/06/2021 FINDINGS: Right Port-A-Cath remains in place, unchanged. Prior CABG. Small right pleural effusion again noted, unchanged. No confluent opacities. Heart is normal size. IMPRESSION: No visible displaced rib fracture. Stable small right pleural effusion. Electronically Signed   By: Rolm Baptise M.D.   On: 06/25/2021 23:31   CT Angio Chest PE W and/or Wo Contrast  Result Date: 06/26/2021 CLINICAL DATA:  Shortness of breath tachycardia history of right lung cancer EXAM: CT ANGIOGRAPHY CHEST WITH CONTRAST TECHNIQUE: Multidetector CT imaging of the chest was performed using the standard protocol during bolus administration of intravenous contrast. Multiplanar CT image reconstructions and MIPs were obtained to evaluate the vascular anatomy. CONTRAST:  73mL OMNIPAQUE IOHEXOL 350 MG/ML SOLN COMPARISON:  Chest x-ray 06/26/2021, CT chest 06/04/2021, 12/12/2020, 04/27/2021, PET CT 11/27/2020 FINDINGS: Cardiovascular: Satisfactory opacification of the pulmonary arteries to the segmental level. No evidence of pulmonary embolism. Status post CABG. Ectatic ascending aorta up to 3.9 cm. Moderate aortic atherosclerosis. Coronary vascular calcification. No pericardial effusion. Mediastinum/Nodes: Midline trachea. No thyroid mass. Endobronchial valves on the right. Esophagus within normal limits. Lungs/Pleura: Right upper lobectomy changes. Small right-sided pleural effusion. Enlarging 6 mm  left upper lobe pulmonary nodule abutting the fissure, series 8, image 48. Upper Abdomen: No acute abnormality. Musculoskeletal: Post sternotomy changes. No acute osseous abnormality. Review of the MIP images confirms the above findings. IMPRESSION: 1. Negative for acute pulmonary embolus. 2. Postsurgical changes on the right with small right-sided pleural effusion. 3. 6 mm left upper lobe enlarging pulmonary nodule, raising concern for metastatic nodule or potential small carcinoma. Pulmonary follow-up recommended. Aortic Atherosclerosis (ICD10-I70.0). Electronically Signed   By: Donavan Foil M.D.   On: 06/26/2021 19:58    Procedures Procedures   Medications Ordered in ED Medications  lidocaine (LIDODERM) 5 % 1 patch (1 patch Transdermal Patch Applied 06/26/21 1647)  oxyCODONE-acetaminophen (PERCOCET/ROXICET) 5-325 MG per tablet 1 tablet (1 tablet Oral Given 06/26/21 1542)  metoprolol tartrate (LOPRESSOR) tablet 25 mg (25 mg Oral Given 06/26/21 1542)  iohexol (OMNIPAQUE) 350 MG/ML injection 80 mL (80 mLs Intravenous Contrast Given 06/26/21 1923)  morphine 2 MG/ML injection 2 mg (2 mg Intravenous Given 06/26/21 2020)    ED Course  I have reviewed the triage vital signs and the nursing notes.  Pertinent labs & imaging results that were available during my care of the patient were reviewed by me and considered in my medical decision making (see chart for details).    MDM Rules/Calculators/A&P                           Alert 65 year old male in no  acute distress, nontoxic-appearing.  Presents with chief complaint of shortness of breath and tachycardia on exertion as well as hypertension.  Per chart review patient was seen yesterday at [facility] for pain to chest wall.  ACS work-up was reassuring.  Rib x-ray showed no acute fractures or dislocations.  Patient was given lidocaine patch and discharged to follow-up with PCP and oncology.  Due to patient's report of shortness of  breath and noted tachycardia in emergency department concern for possible PE due to patient's history and recent lung cancer.  Will obtain CTA chest to evaluate for possible PE.  Patient reports that he did not take his metoprolol medication for hypertension today.  We will give patient home dose of metoprolol.  CBC shows mildly elevated white count and anemia.  Anemia appears baseline for patient. Troponin 5 low suspicion for ACS as patient's pain has been constant over the last 10 days  CTA shows no signs of PE, small right-sided pleural effusion, 6 mm left upper lobe enlarging pulmonary nodule, raising concern for metastatic nodule or potential small carcinoma.  Patient was informed of findings of CTA.  Patient advised to follow-up with his oncologist for further evaluation/management of pulmonary nodule.  Due to patient's hypertension we will have him follow-up with primary care provider for repeat assessment.  Patient previously has documented hypotension and therefore we will defer any hypertension management at this time.  Discussed results, findings, treatment and follow up. Patient advised of return precautions. Patient verbalized understanding and agreed with plan.   Final Clinical Impression(s) / ED Diagnoses Final diagnoses:  Chest wall pain  Hypertension, unspecified type    Rx / DC Orders ED Discharge Orders     None        Dyann Ruddle 06/26/21 2342    Fredia Sorrow, MD 07/01/21 [phone]

## 2021-06-26 NOTE — ED Provider Notes (Signed)
[facility] EMERGENCY DEPARTMENT Provider Note   CSN: [id number] Arrival date & time: 06/25/21  2251     History Chief Complaint  Patient presents with   Rib pain    [name] is a 65 y.o. male.  The history is provided by the patient and medical records.  [name] is a 65 y.o. male who presents to the Emergency Department complaining of chest pain.  He presents to the ED complaining of RUQ/right lower chest pain that started 1.5 weeks ago.  Pain is constant in nature, burning and shooting in nature.  At times worsens.  Has pain with deep breathing.  Has nausea and vomiting today.  Also reports elevated heart rate starting today.  No fever.  No leg edema.    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  Scheduled for PET scan a week from now.  Takes Xarelto for hx/o PE during chemo.  Does not feel like prior PE.  Today's pain feels like it is in the ribs.      Past Medical History:  Diagnosis Date   Anxiety    Arthritis    Basal cell carcinoma (BCC) of forehead    CAD (coronary artery disease)    a. 10/2015 ant STEMI >> LHC with 3 v CAD; oLAD tx with POBA >> emergent CABG. b. Multiple evals since that time, early graft failure of SVG-RCA by cath 03/2016. c. 2/19 PCI/DES x1 to pRCA, normal EF.   Carotid artery disease ([place])    a. 40-59% BICA 02/2018.   Depression    Dyspnea    Ectopic atrial tachycardia (HCC)    Esophageal reflux    eosinophil esophagitis   Family history of adverse reaction to anesthesia    "sister has PONV" (06/21/2017)   Former tobacco use    Gout    Hepatitis C    "treated and cured" (06/21/2017)   High cholesterol    History of kidney stones    Hypertension    Ischemic cardiomyopathy    a. EF 25-30% at intraop TEE 4/17  //  b. Limited Echo 5/17 - EF 45-50%, mild ant HK. c. EF 55-65% by cath 09/2017.   Migraine    "3-4/yr" (06/21/2017)   Myocardial infarction ([place]) 10/2015    Palpitations    Sinus bradycardia    a. HR dropping into 40s in 02/2016 -> BB reduced.   Stroke [facility]) 10/2016   "small one; sometimes my memory/cognitive issues" (06/21/2017)   Symptomatic hypotension    a. 02/2016 ER visit -> meds reduced.   Syncope    Wears dentures    Wears glasses     Patient Active Problem List   Diagnosis Date Noted   Hemoptysis 06/06/2021   History of pulmonary embolus (PE) 04/29/2021   Anemia 04/29/2021   Hypotension due to hypovolemia 04/28/2021   Hypomagnesemia 04/08/2021   Port-A-Cath in place 04/07/2021   Protein-calorie malnutrition, severe 03/25/2021   PNA (pneumonia) 03/22/2021   Community acquired pneumonia 03/22/2021   Sepsis ([place]) 03/22/2021   CKD (chronic kidney disease), stage III ([place]) 03/22/2021   Hypoxia 02/28/2021   Dyspnea 02/28/2021   AKI (acute kidney injury) ([place]) 02/25/2021   AF (paroxysmal atrial fibrillation) ([place]) 02/25/2021   Pleural effusion 02/25/2021   Neutropenia, drug-induced ([place]) 02/24/2021   Positive blood cultures 02/17/2021   Positive blood culture 02/16/2021   Right flank pain 02/09/2021   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[place])  01/30/2021   Encounter for antineoplastic chemotherapy 01/29/2021   Goals of care, counseling/discussion 01/14/2021   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[place]) 01/14/2021   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   Acute back pain with sciatica 01/12/2021   Allergic rhinitis 01/12/2021   Amnesia 01/12/2021   Kidney stone 01/12/2021   Sciatica 01/12/2021   Bipolar disorder ([place]) 01/12/2021   S/P lobectomy of lung 12/24/2020   Solitary lung nodule 11/28/2020   Chest pain 11/07/2019   Gastroesophageal reflux disease    Cervical radiculopathy 10/17/2018   Preoperative clearance 10/04/2018   Palpitations    Coronary artery disease involving coronary bypass graft of native heart with angina pectoris (HCC)    Transient loss of consciousness 03/24/2018    Ectopic atrial tachycardia ([place]) 02/09/2018   Central chest pain 03/10/2017   Family hx-stroke 11/10/2016   Stroke-like episode ([place]) - R brain, s/p tPA 11/09/2016   Unstable angina ([place]) 09/07/2016   Claudication of both lower extremities ([place])    Pure hypercholesterolemia    Tobacco abuse disorder    CAD of autologous artery bypass graft without angina    Chest pain at rest 06/10/2016   Abnormal nuclear stress test - HIGH RISK 04/20/2016   Old MI (myocardial infarction)    Essential hypertension 02/26/2016   Ischemic cardiomyopathy 12/25/2015   Hyperlipidemia LDL goal <70 12/25/2015   Mild tobacco abuse in early remission 11/28/2015   Coronary artery disease involving native coronary artery of native heart with angina pectoris ([place]) 11/28/2015   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   Acute MI anterior wall first episode care [facility])    Precordial chest pain 03/07/2015   Gout attack 03/07/2015   Mixed bipolar I disorder ([place]) 03/07/2015   Fibromyalgia 07/09/2014   Gout 07/09/2014   Anxiety 07/09/2014   Depression 07/09/2014   Hepatitis C 56/38/7564   Eosinophilic esophagitis 33/29/5188    Past Surgical History:  Procedure Laterality Date   25 GAUGE PARS PLANA VITRECTOMY WITH 20 GAUGE MVR PORT  12/31/2020   Procedure: 25 GAUGE PARS PLANA VITRECTOMY WITH 20 GAUGE MVR PORT;  Surgeon: [name], MD;  [facility]: [facility] OR;  Service: Thoracic;;   ANTERIOR CERVICAL DECOMP/DISCECTOMY FUSION N/A 10/17/2018   Procedure: Anterior Cervical Decompression Fusion - Cervical seven -Thoracic one;  Surgeon: Consuella Lose, MD;  Location: [facility];  Service: Neurosurgery;  Laterality: N/A;   BASAL CELL CARCINOMA EXCISION     "forehead   BIOPSY  07/20/2019   Procedure: BIOPSY;  Surgeon: Carol Ada, MD;  Location: [facility];  Service: Endoscopy;;   BRONCHIAL BIOPSY  12/24/2020   Procedure: BRONCHIAL BIOPSIES;  Surgeon: Garner Nash, DO;  Location: [facility] ENDOSCOPY;  Service: Pulmonary;;   BRONCHIAL  BRUSHINGS  12/24/2020   Procedure: BRONCHIAL BRUSHINGS;  Surgeon: Garner Nash, DO;  Location: [facility];  Service: Pulmonary;;   BRONCHIAL NEEDLE ASPIRATION BIOPSY  12/24/2020   Procedure: BRONCHIAL NEEDLE ASPIRATION BIOPSIES;  Surgeon: Garner Nash, DO;  Location: [facility];  Service: Pulmonary;;   CARDIAC CATHETERIZATION N/A 11/28/2015   Procedure: Left Heart Cath and Coronary Angiography;  Surgeon: [name], MD;  Location: [facility] CV LAB;  Service: Cardiovascular;  Laterality: N/A;   CARDIAC CATHETERIZATION N/A 11/28/2015   Procedure: Coronary Balloon Angioplasty;  Surgeon: [name], MD;  Location: [facility] CV LAB;  Service: Cardiovascular;  Laterality: N/A;  ostial LAD   CARDIAC CATHETERIZATION N/A 11/28/2015   Procedure: Coronary/Graft Angiography;  Surgeon: [name],  MD;  Location: [facility] CV LAB;  Service: Cardiovascular;  Laterality: N/A;  coronaries only    CARDIAC CATHETERIZATION N/A 04/21/2016   Procedure: Left Heart Cath and Coronary Angiography;  Surgeon: Wellington Hampshire, MD;  Location: [facility] LAB;  Service: Cardiovascular;  Laterality: N/A;   CARDIAC CATHETERIZATION N/A 06/14/2016   Procedure: Left Heart Cath and Cors/Grafts Angiography;  Surgeon: Lorretta Harp, MD;  Location: [facility] LAB;  Service: Cardiovascular;  Laterality: N/A;   CARDIAC CATHETERIZATION N/A 09/08/2016   Procedure: Left Heart Cath and Cors/Grafts Angiography;  Surgeon: Wellington Hampshire, MD;  Location: [facility] CV LAB;  Service: Cardiovascular;  Laterality: N/A;   CARDIAC CATHETERIZATION     CORONARY ARTERY BYPASS GRAFT N/A 11/28/2015   Procedure: CORONARY ARTERY BYPASS GRAFTING (CABG) TIMES FIVE USING LEFT INTERNAL MAMMARY ARTERY AND RIGHT GREATER SAPHENOUS,VIEN HARVEATED BY ENDOVIEN, INTRAOPPRATIVE TEE;  Surgeon: Gaye Pollack, MD;  Location: [facility];  Service: Open Heart Surgery;  Laterality: N/A;   CORONARY STENT INTERVENTION N/A 10/05/2017    Procedure: CORONARY STENT INTERVENTION;  Surgeon: [name], MD;  Location: [place] [facility] LAB;  Service: Cardiovascular;  Laterality: N/A;   ESOPHAGOGASTRODUODENOSCOPY (EGD) WITH PROPOFOL N/A 07/20/2019   Procedure: ESOPHAGOGASTRODUODENOSCOPY (EGD) WITH PROPOFOL;  Surgeon: Carol Ada, MD;  [facility];  Service: Endoscopy;  Laterality: N/A;   ESOPHAGOGASTRODUODENOSCOPY (EGD) WITH PROPOFOL N/A 01/30/2021   Procedure: ESOPHAGOGASTRODUODENOSCOPY (EGD) WITH PROPOFOL;  Surgeon: Carol Ada, MD;  [facility];  Service: Endoscopy;  Laterality: N/A;   FIDUCIAL MARKER PLACEMENT  12/24/2020   Procedure: FIDUCIAL DYE MARKER PLACEMENT;  Surgeon: Garner Nash, DO;  Location: [facility] ENDOSCOPY;  Service: Pulmonary;;   HUMERUS SURGERY Right 1969   "tumor inside bone; filled it w/bone chips"   INTERCOSTAL NERVE BLOCK Right 12/24/2020   Procedure: INTERCOSTAL NERVE BLOCK;  Surgeon: [name], MD;  Location: [facility];  Service: Thoracic;  Laterality: Right;   IR IMAGING GUIDED PORT INSERTION  02/06/2021   LEFT HEART CATH AND CORS/GRAFTS ANGIOGRAPHY N/A 03/11/2017   Procedure: Left Heart Cath and Cors/Grafts Angiography;  Surgeon: Leonie Man, MD;  Location: [facility];  Service: Cardiovascular;  Laterality: N/A;   LEFT HEART CATH AND CORS/GRAFTS ANGIOGRAPHY N/A 10/05/2017   Procedure: LEFT HEART CATH AND CORS/GRAFTS ANGIOGRAPHY;  Surgeon: [name], MD;  Location: [facility];  Service: Cardiovascular;  Laterality: N/A;   LEFT HEART CATH AND CORS/GRAFTS ANGIOGRAPHY N/A 04/11/2019   Procedure: LEFT HEART CATH AND CORS/GRAFTS ANGIOGRAPHY;  Surgeon: [name], MD;  Location: [place] CV LAB;  Service: Cardiovascular;  Laterality: N/A;   NODE DISSECTION Right 12/24/2020   Procedure: NODE DISSECTION;  Surgeon: [name], MD;  Location: [facility];  Service: Thoracic;  Laterality: Right;   PERIPHERAL VASCULAR CATHETERIZATION N/A  06/14/2016   Procedure: Lower Extremity Angiography;  Surgeon: Lorretta Harp, MD;  Location: [facility];  Service: Cardiovascular;  Laterality: N/A;   VIDEO BRONCHOSCOPY WITH ENDOBRONCHIAL NAVIGATION Right 12/24/2020   Procedure: VIDEO BRONCHOSCOPY WITH ENDOBRONCHIAL NAVIGATION;  Surgeon: Garner Nash, DO;  Location: [facility];  Service: Pulmonary;  Laterality: Right;   VIDEO BRONCHOSCOPY WITH ENDOBRONCHIAL ULTRASOUND N/A 12/24/2020   Procedure: VIDEO BRONCHOSCOPY WITH ENDOBRONCHIAL ULTRASOUND;  Surgeon: Garner Nash, DO;  Location: [facility];  Service: Pulmonary;  Laterality: N/A;   VIDEO BRONCHOSCOPY WITH INSERTION OF INTERBRONCHIAL VALVE (IBV) N/A 12/31/2020   Procedure: VIDEO BRONCHOSCOPY WITH INSERTION OF  INTERBRONCHIAL VALVE (IBV).VALVE IN CARTRIDGE 44mm,9mm. CHEST TUBE PLACEMENT.;  Surgeon: [name], MD;  [facility];  Service: Thoracic;  Laterality: N/A;       Family History  Problem Relation Age of Onset   Lung cancer Mother    Heart Problems Father    Heart attack Father 25   Stroke Father    Heart failure Father    Heart attack Maternal Grandmother    Stroke Maternal Grandmother    Heart attack Paternal Uncle    Hypertension Brother    Autoimmune disease Neg Hx     Social History   Tobacco Use   Smoking status: Former    Packs/day: 0.75    Years: 44.00    Pack years: 33.00    Types: Cigarettes    Quit date: 11/28/2015    Years since quitting: 5.5   Smokeless tobacco: Never  Vaping Use   Vaping Use: Never used  Substance Use Topics   Alcohol use: Yes    Comment: occ   Drug use: No    Comment: 06/21/2017 "nothing since the 1980s"    Home Medications Prior to Admission medications   Medication Sig Start Date End Date Taking? Authorizing Provider  lidocaine (LIDODERM) 5 % Place 1 patch onto the skin daily. Remove & Discard patch within 12 hours or as directed by MD 06/26/21  Yes Quintella Reichert, MD  oxyCODONE (ROXICODONE) 5  MG immediate release tablet Take 1 tablet (5 mg total) by mouth every 6 (six) hours as needed for severe pain. 06/26/21  Yes Quintella Reichert, MD  acetaminophen (TYLENOL) 500 MG tablet Take 1,000 mg by mouth every 6 (six) hours as needed for moderate pain.    [provider]  atorvastatin (LIPITOR) 80 MG tablet TAKE 1 TABLET(80 MG) BY MOUTH DAILY 04/01/21   [name], MD  ferrous gluconate (FERGON) 324 MG tablet Take 1 tablet (324 mg total) by mouth daily with breakfast. 06/11/21 09/09/21  Mariel Aloe, MD  lidocaine-prilocaine (EMLA) cream Apply 1 application topically once as needed (port access). 02/27/21   Aline August, MD  magnesium oxide (MAG-OX) 400 (240 Mg) MG tablet Take 1 tablet (400 mg total) by mouth daily. 04/07/21   Heilingoetter, Cassandra L, PA-C  metoprolol succinate (TOPROL-XL) 25 MG 24 hr tablet Take 1 tablet (25 mg total) by mouth daily. 08/27/20   [name], MD  midodrine (PROAMATINE) 2.5 MG tablet Take 1 tablet (2.5 mg total) by mouth 3 (three) times daily with meals. 06/15/21 12/12/21  Loel Dubonnet, NP  nitroGLYCERIN (NITROSTAT) 0.4 MG SL tablet PLACE 1 TABLET UNDER THE TONGUE EVERY 5 MINUTES AS NEEDED FOR CHEST PAIN. 3 DOSES MAX 04/16/20   [name], MD  ondansetron (ZOFRAN) 8 MG tablet Take 1 tablet (8 mg total) by mouth every 8 (eight) hours as needed for nausea or vomiting. Starting 3 days after chemotherapy Patient not taking: Reported on 06/15/2021 04/01/21   Heilingoetter, Cassandra L, PA-C  pantoprazole (PROTONIX) 40 MG tablet Take 1 tablet (40 mg total) by mouth daily. Patient not taking: Reported on 06/15/2021 05/05/21   Quintella Reichert, MD  prochlorperazine (COMPAZINE) 10 MG tablet Take 1 tablet (10 mg total) by mouth every 6 (six) hours as needed. Patient not taking: Reported on 06/15/2021 01/14/21   Heilingoetter, Cassandra L, PA-C  ranolazine (RANEXA) 1000 MG SR tablet Take 1 tablet (1,000 mg total) by mouth 2 (two) times daily.  05/25/21   [name], MD  Tiotropium Bromide-Olodaterol (STIOLTO RESPIMAT) 2.5-2.5 MCG/ACT AERS Inhale 2 puffs into the lungs daily. Patient not taking: Reported on 06/15/2021 01/12/21   Icard, Octavio Graves, DO  tiZANidine (ZANAFLEX) 2 MG tablet Take 1 tablet (2 mg total) by mouth every 8 (eight) hours as needed for muscle spasms. 06/11/21   Mariel Aloe, MD  XARELTO 20 MG TABS tablet Take 20 mg by mouth daily. 06/01/21   [provider]    Allergies    Prednisone, Tetanus toxoids, Wellbutrin [bupropion], Indomethacin, Other, and Varenicline  Review of Systems   Review of Systems  All other systems reviewed and are negative.  Physical Exam Updated Vital Signs BP (!) 143/93 (BP Location: Left Arm)   Pulse 80   Temp 98.3 F (36.8 C) (Oral)   Resp 16   Ht 5\' 9"  (1.753 m)   Wt 75.8 kg   SpO2 100%   BMI 24.66 kg/m   Physical Exam Vitals and nursing note reviewed.  Constitutional:      Appearance: He is well-developed.  HENT:     Head: Normocephalic and atraumatic.  Cardiovascular:     Rate and Rhythm: Normal rate and regular rhythm.     Heart sounds: No murmur heard. Pulmonary:     Effort: Pulmonary effort is normal. No respiratory distress.     Breath sounds: Normal breath sounds.     Comments: TTP over lower right costal margin anteriorly and posteriorly Abdominal:     Palpations: Abdomen is soft.     Tenderness: There is no abdominal tenderness. There is no guarding or rebound.  Musculoskeletal:        General: No swelling or tenderness.  Skin:    General: Skin is warm and dry.  Neurological:     Mental Status: He is alert and oriented to person, place, and time.  Psychiatric:        Behavior: Behavior normal.    ED Results / Procedures / Treatments   Labs (all labs ordered are listed, but only abnormal results are displayed) Labs Reviewed  COMPREHENSIVE METABOLIC PANEL - Abnormal; Notable for the following components:      Result Value    Glucose, Bld 103 (*)    BUN 27 (*)    All other components within normal limits  CBC WITH DIFFERENTIAL/PLATELET - Abnormal; Notable for the following components:   WBC 11.5 (*)    RBC 3.87 (*)    Hemoglobin 12.1 (*)    HCT 37.8 (*)    Neutro Abs 8.7 (*)    All other components within normal limits  RESP PANEL BY RT-PCR (FLU A&B, COVID) ARPGX2  GROUP A STREP BY PCR  TROPONIN I (HIGH SENSITIVITY)  TROPONIN I (HIGH SENSITIVITY)    EKG None  Radiology DG Ribs Unilateral W/Chest Right  Result Date: 06/25/2021 CLINICAL DATA:  Right side rib pain. History of lung cancer. Shortness of breath EXAM: RIGHT RIBS AND CHEST - 3+ VIEW COMPARISON:  06/06/2021 FINDINGS: Right Port-A-Cath remains in place, unchanged. Prior CABG. Small right pleural effusion again noted, unchanged. No confluent opacities. Heart is normal size. IMPRESSION: No visible displaced rib fracture. Stable small right pleural effusion. Electronically Signed   By: Rolm Baptise M.D.   On: 06/25/2021 23:31    Procedures Procedures   Medications Ordered in ED Medications  lidocaine (LIDODERM) 5 % 1 patch (1 patch Transdermal Patch Applied 06/26/21 0318)  acetaminophen (TYLENOL) tablet 650 mg (650 mg Oral Given 06/26/21 0104)  oxyCODONE (Oxy IR/ROXICODONE) immediate release  tablet 5 mg (5 mg Oral Given 06/26/21 0315)  HYDROmorphone (DILAUDID) injection 1 mg (1 mg Intravenous Given 06/26/21 1610)    ED Course  I have reviewed the triage vital signs and the nursing notes.  Pertinent labs & imaging results that were available during my care of the patient were reviewed by me and considered in my medical decision making (see chart for details).    MDM Rules/Calculators/A&P                          patient with history of PE, coronary artery disease, lung cancer here for evaluation of right-sided chest pain/rib pain for the last week and a half. He is non-toxic appearing on evaluation with no respiratory distress. He is  reproducible tenderness to palpation over the lower costal margin. This area of tenderness is near his prior surgical site. There is no overlying skin changes concerning for infection or shingles. EKG is without acute ischemic changes in his troponin is negative. He is appropriately anticoagulated for PE, doubt Rick breakthrough PE. No evidence of pneumonia. Presentation is not consistent with ACS, dissection, pneumonia, acute bacterial infection. Discussed with patient home care for chest wall pain. Discussed PCP/oncology follow-up and return precautions.  Final Clinical Impression(s) / ED Diagnoses Final diagnoses:  Chest wall pain    Rx / DC Orders ED Discharge Orders          Ordered    lidocaine (LIDODERM) 5 %  Every 24 hours        06/26/21 0524    oxyCODONE (ROXICODONE) 5 MG immediate release tablet  Every 6 hours PRN        06/26/21 0526             Quintella Reichert, MD 06/26/21 [phone]

## 2021-06-26 NOTE — Discharge Instructions (Addendum)
You came to the emergency department today to be assessed for your chest wall pain, hypertension, and tachycardia.  Your physical exam was reassuring.  Your lab work showed no abnormalities.  The CT scan of your chest showed no signs of any blood clots.  The CT scan of your chest showed a spot concerning for cancer.  You will need to follow-up with your oncologist.  Get help right away if you: Develop a severe headache or confusion. Have unusual weakness or numbness. Feel faint. Have severe pain in your chest or abdomen. Vomit repeatedly. Have trouble breathing.  Your blood pressure improved after taking your metoprolol.  Please follow-up with your primary care provider for further assessment of your blood pressure.

## 2021-06-26 NOTE — ED Notes (Signed)
ED Provider at bedside. 

## 2021-06-29 NOTE — Telephone Encounter (Signed)
Patient is following up. Requested to speak with Sherri, RN again, but I informed him she is not in triage and not available right this second. However, assured him another triage nurse will contact him shortly. He wanted to follow up regarding symptoms discussed on Friday, 10/28. States he hasn't been recording his BP readings, but it is still high-fluctuating. States yesterday his BP was 190/110. States he did go to the hospital as advised, but they weren't able to manage HTN. BP is alright when laying down, but rises drastically with minimal exertion. States he had an episode Saturday, 10/29, where he almost fainted--very dizzy and nauseous. He states he recently had a bad head cold and assumes that may have played a role in nausea/dizziness.   Pt c/o BP issue: STAT if pt c/o blurred vision, one-sided weakness or slurred speech  1. What are your last 5 BP readings?  10/31: 190/110  2. Are you having any other symptoms (ex. Dizziness, headache, blurred vision, passed out)?  Nausea/dizziness   3. What is your BP issue?   BP is still fluctuating-high

## 2021-06-29 NOTE — Telephone Encounter (Signed)
Patient completing Orthostactic VS with nurse guidance:  120/63 52 Laying   118/78 95 Sitting   91/55 130  Standing (patient light headed)   90/60 129 Standing after 3 mins   Advised at this time he is orthostactic positive and needs the midodrine that he has been holding related to hypertension. Verbalized understanding. Will take a dose now and if he is lightheaded or orthostactic by VS he will take the midodrine up to 3 times a day until further advisement from Dr. Irish Lack. He has currently not taken his Metoprolol succinate 25 mg. He will take the Midodrine first and see how his BP and HR does, to see if he needs to take his Metoprolol succinate. Gave guidance for resting BP > 150/90 or HR >100 to take metoprolol succinate while waiting for advisement from Dr. Irish Lack on how to proceed with the two medications.    Patient stated his BP and HR run very high after very limited activity such as going up the stairs. By the time he gets to the top he is short of breath and gets concerned and takes his BP and HR, from there it is high. Discussed and educated about him being deconditioned post cancer treatments and small activities taking more out of him than before his treatment. Verbalized understanding and not taking BP and HR when he is SOB and/or right after finishing an activity, but instead taking it after ~5 mins of rest.

## 2021-06-30 ENCOUNTER — Telehealth: Payer: Self-pay | Admitting: Medical Oncology

## 2021-06-30 NOTE — Telephone Encounter (Signed)
Returned pt call and confirmed PET scan appt.

## 2021-06-30 NOTE — Telephone Encounter (Signed)
I agree with starting Midodrine and he needs to try to stay well hydrated.

## 2021-06-30 NOTE — Telephone Encounter (Signed)
Checked on patient this morning and he has orthostactic hypotension. He has taken his midodrine and reminded to hydrate well. Verbalized understanding

## 2021-07-03 ENCOUNTER — Other Ambulatory Visit: Payer: Self-pay

## 2021-07-03 ENCOUNTER — Ambulatory Visit (HOSPITAL_COMMUNITY)
Admission: RE | Admit: 2021-07-03 | Discharge: 2021-07-03 | Disposition: A | Discharge status: Home / Self Care | Payer: Medicare Other | Source: Ambulatory Visit | Attending: Physician Assistant | Admitting: Physician Assistant

## 2021-07-03 DIAGNOSIS — C3491 Malignant neoplasm of unspecified part of right bronchus or lung: Secondary | ICD-10-CM | POA: Diagnosis not present

## 2021-07-03 LAB — GLUCOSE, CAPILLARY: Glucose-Capillary: 125 mg/dL — ABNORMAL HIGH (ref 70–99)

## 2021-07-03 MED ORDER — FLUDEOXYGLUCOSE F - 18 (FDG) INJECTION: 8.2000 | Freq: Once | INTRAVENOUS | Status: DC

## 2021-07-06 ENCOUNTER — Other Ambulatory Visit: Payer: Self-pay | Admitting: Physician Assistant

## 2021-07-06 DIAGNOSIS — J189 Pneumonia, unspecified organism: Secondary | ICD-10-CM

## 2021-07-06 MED ORDER — DOXYCYCLINE HYCLATE 100 MG PO TABS: 100.0000 mg | ORAL_TABLET | Freq: Two times a day (BID) | ORAL | 0 refills | Status: DC

## 2021-07-07 ENCOUNTER — Telehealth: Payer: Self-pay

## 2021-07-07 ENCOUNTER — Telehealth: Payer: Self-pay | Admitting: Physician Assistant

## 2021-07-07 NOTE — Telephone Encounter (Signed)
[name] from [facility] has returned the call wanting to advise they were considering referring pt to Pain Management and wanted to know if the [facility] uses a specific Pain Management specialist. I have advised that we refer wherever pts insurance is accepted and typically to a [facility] provider.

## 2021-07-07 NOTE — Telephone Encounter (Signed)
Patient contacted the office concerned about increase pain he describes as "ungodly". He states that this started about a month ago. He is s/p RATS RULobectomy with IBV valve placement later with Dr. Kipp Brood. He states that he has a "lump" right behind his incision that the pain starts from, then shoots along his back and to his chest. He does have an appointment to see Dr. Kipp Brood this Friday which he is aware of. Advised patient that it sound like neuropathic pain and that he can discuss options for relief with Dr. Kipp Brood at his appointment. He acknowledged receipt. He also states that he is going to discuss having IBV removed at his upcoming appointment.

## 2021-07-07 NOTE — Telephone Encounter (Signed)
Pts PCP office called wanting to discuss a referral to pain management. I have returned her call and LM.

## 2021-07-07 NOTE — Telephone Encounter (Signed)
Scheduled per sch msg. Called and spoke with patient. Confirmed appt  

## 2021-07-08 ENCOUNTER — Encounter (HOSPITAL_BASED_OUTPATIENT_CLINIC_OR_DEPARTMENT_OTHER): Payer: Self-pay

## 2021-07-10 ENCOUNTER — Ambulatory Visit: Payer: Medicare Other | Admitting: Thoracic Surgery (Cardiothoracic Vascular Surgery)

## 2021-07-14 ENCOUNTER — Encounter (HOSPITAL_BASED_OUTPATIENT_CLINIC_OR_DEPARTMENT_OTHER): Payer: Self-pay

## 2021-07-14 ENCOUNTER — Encounter: Payer: Self-pay | Admitting: Internal Medicine

## 2021-07-14 ENCOUNTER — Encounter: Payer: Self-pay | Admitting: Physician Assistant

## 2021-07-14 NOTE — Telephone Encounter (Signed)
ERROR

## 2021-07-16 ENCOUNTER — Telehealth: Payer: Self-pay | Admitting: *Deleted

## 2021-07-16 NOTE — Telephone Encounter (Signed)
Spoke with the patient regarding a voicemail he left.  He reports increased headaches, weight loss, decreased appetite, difficulty with sleep and generally not feeling well.  He states he is feeling a little better today.  He is seeing his surgeon tomorrow due to increased nerve pain.  He has been referred to pain management clinic but is unable to get in until after Christmas.  He reports increased anxiety, taking medication on occasion.  I discussed his concerns with the PA and she does not believe his symptoms are in relation to his previous brain disease.  His last MRI brain in September this year looked good.  She advised that we continue with out current plans for repeat MRI brain 07/2021, and for him to give [name] a call back should he experience confusion, worsening headaches that don't resolve with medication, and nausea/vomiting.  He verbalized understanding.  Gloriajean Dell. Leonie Green, BSN

## 2021-07-17 ENCOUNTER — Other Ambulatory Visit: Payer: Self-pay

## 2021-07-17 ENCOUNTER — Ambulatory Visit (INDEPENDENT_AMBULATORY_CARE_PROVIDER_SITE_OTHER): Payer: Medicare Other | Admitting: Thoracic Surgery (Cardiothoracic Vascular Surgery)

## 2021-07-17 VITALS — BP 127/80 | HR 105 | Resp 20 | Ht 69.0 in | Wt 170.0 lb

## 2021-07-17 DIAGNOSIS — Z09 Encounter for follow-up examination after completed treatment for conditions other than malignant neoplasm: Secondary | ICD-10-CM

## 2021-07-17 DIAGNOSIS — C3411 Malignant neoplasm of upper lobe, right bronchus or lung: Secondary | ICD-10-CM | POA: Diagnosis not present

## 2021-07-17 NOTE — Progress Notes (Signed)
      [address]       [place] [address]             [phone]        Hillard E Ammons [facility] Medical Record #[id number] Date of Birth: September 22, 1955  Referring: Garner Nash, DO Primary Care: [name], MD Primary Cardiologist:Jayadeep Irish Lack, MD  Reason for visit:   follow-up  History of Present Illness:     Mr. Olden comes in in follow-up.  He complains of incisional pain that started several weeks ago.  This is new for him.  He recently underwent a PET/CT which showed some fluid in the right chest as well as some nodularity along the chest wall.  Both of these areas had avidity.  Physical Exam: BP 127/80   Pulse (!) 105   Resp 20   Ht 5\' 9"  (1.753 m)   Wt 170 lb (77.1 kg)   SpO2 95% Comment: RA  BMI 25.10 kg/m   Alert NAD Incision well-healed.  The assistant port site had a palpable nodule.  All of the port sites were somewhat tender to palpation.      Diagnostic Studies & Laboratory data: I personally reviewed the PET/CT.  I am concerned that he may have some port sites.  Additionally there is a new fluid collection on the right side.    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  His new port site incisional pain is somewhat mild.  I am concerned about the nodularity as well as avidity.  It is possible that he may have local recurrence along his chest wall which would be very odd.  I do not think that these are postsurgical changes given that his original surgery was in April of this year.  I will discuss this with his medical oncologist.  Biopsy to confirm this.  This could potentially be done at the same time as his endobronchial valve removal.   Lucile Crater Barbara Keng 07/17/2021 3:22 PM

## 2021-07-20 ENCOUNTER — Ambulatory Visit: Payer: Medicare Other | Admitting: Pulmonary Disease

## 2021-07-20 ENCOUNTER — Other Ambulatory Visit: Payer: Self-pay | Admitting: *Deleted

## 2021-07-20 ENCOUNTER — Other Ambulatory Visit: Payer: Self-pay

## 2021-07-20 ENCOUNTER — Encounter: Payer: Self-pay | Admitting: *Deleted

## 2021-07-20 ENCOUNTER — Ambulatory Visit (INDEPENDENT_AMBULATORY_CARE_PROVIDER_SITE_OTHER): Payer: Medicare Other | Admitting: Thoracic Surgery (Cardiothoracic Vascular Surgery)

## 2021-07-20 DIAGNOSIS — Z09 Encounter for follow-up examination after completed treatment for conditions other than malignant neoplasm: Secondary | ICD-10-CM

## 2021-07-20 DIAGNOSIS — Z5181 Encounter for therapeutic drug level monitoring: Secondary | ICD-10-CM

## 2021-07-20 DIAGNOSIS — R222 Localized swelling, mass and lump, trunk: Secondary | ICD-10-CM

## 2021-07-20 NOTE — H&P (View-Only) (Signed)
     [address]       [place] [address]             [phone]       Patient: Home Provider: Office Consent for Telemedicine visit obtained.  Today's visit was completed via a real-time telehealth (see specific modality noted below). The patient/authorized person provided oral consent at the time of the visit to engage in a telemedicine encounter with the present provider at [facility]. The patient/authorized person was informed of the potential benefits, limitations, and risks of telemedicine. The patient/authorized person expressed understanding that the laws that protect confidentiality also apply to telemedicine. The patient/authorized person acknowledged understanding that telemedicine does not provide emergency services and that he or she would need to call 911 or proceed to the nearest hospital for help if such a need arose.   Total time spent in the clinical discussion 10 minutes.  Telehealth Modality: Phone visit (audio only)  I had a telephone visit with a spoke with [name] today.  We will plan to perform bronchoscopy with endobronchial valve removal.  At the same time we will perform a biopsy of his right chest wall lesion.  He will be off his Xarelto for 3 days prior to surgery.  He is tentatively scheduled for November 30.

## 2021-07-20 NOTE — Progress Notes (Signed)
     [address]       [place] [address]             [phone]       Patient: Home Provider: Office Consent for Telemedicine visit obtained.  Today's visit was completed via a real-time telehealth (see specific modality noted below). The patient/authorized person provided oral consent at the time of the visit to engage in a telemedicine encounter with the present provider at [facility]. The patient/authorized person was informed of the potential benefits, limitations, and risks of telemedicine. The patient/authorized person expressed understanding that the laws that protect confidentiality also apply to telemedicine. The patient/authorized person acknowledged understanding that telemedicine does not provide emergency services and that he or she would need to call 911 or proceed to the nearest hospital for help if such a need arose.   Total time spent in the clinical discussion 10 minutes.  Telehealth Modality: Phone visit (audio only)  I had a telephone visit with a spoke with [name] today.  We will plan to perform bronchoscopy with endobronchial valve removal.  At the same time we will perform a biopsy of his right chest wall lesion.  He will be off his Xarelto for 3 days prior to surgery.  He is tentatively scheduled for November 30.

## 2021-07-27 ENCOUNTER — Encounter (HOSPITAL_COMMUNITY): Payer: Self-pay

## 2021-07-27 NOTE — Progress Notes (Signed)
Surgical Instructions   Your procedure is scheduled on Wednesday 07/29/2021.  Report to [facility] Main Entrance "A" at 1:40 P.M., then check in with the Admitting office.  Call [phone] if you have problems or questions between now and the morning of surgery:   Remember: Do not eat after midnight the night before your surgery  You may drink clear liquids until 12:40 P.M. the afternoon of your surgery.   Clear liquids allowed are: Water, Non-Citrus Juices (without pulp), Carbonated Beverages, Clear Tea, Black Coffee Only (NO MILK, CREAM, or POWDERED CREAMER of any kind), and Gatorade   Take these medicines the morning of surgery with A SIP OF WATER:  Atrovastatin (Lipitor) Midodrine (Proamatine) Ranolazine (Ranexa)   If needed you may take these medications the morning of surgery: Nitroglycerin (Nitrostat) Ondansetron (Zofran) Oxycodone (Roxicodone) Prochlorperazine (Compazine)   As of today, STOP taking any Aspirin-containing products; NSAIDS - Aleve, Naproxen, Ibuprofen, Motrin, Advil, Goody's, BC's, all herbal medications, fish oil, and all vitamins.  Follow your surgeon's instructions on when to stop Aspirin and Xarelto.  If no instructions were given by your surgeon then you will need to call the office to get those instructions.     3 days leading up to your surgery or after your pre-procedure COVID test You are not required to quarantine however you are required to wear a well-fitting mask when you are out and around people not in your household.  If your mask becomes wet or soiled, replace with a new one.  Wash your hands often with soap and water for 20 seconds or clean your hands with an alcohol-based hand sanitizer that contains at least 60% alcohol.  Do not share personal items.  Notify your provider: if you are in close contact with someone who has COVID  or if you develop a fever of 100.4 or greater, sneezing, cough, sore throat, shortness of breath or body  aches.          Do not wear jewelry or makeup  Do not wear lotions, powders, perfumes/colognes, or deodorant.  Do not shave 48 hours prior to surgery.  Men may shave face and neck.  Do not wear nail polish, gel polish, artificial nails, or any other type of covering on natural nails including fingernails and toenails. If patients have artificial nails, gel coating, etc. that need to be removed by a nail salon please have this removed prior to surgery or surgery may need to be canceled/delayed if the surgeon/ anesthesia feels like the patient is unable to be adequately monitored.  Do not bring valuables to the hospital - [facility] is not responsible for any belongings or valuables.  Do NOT Smoke (Tobacco/Vaping) or drink Alcohol 24 hours prior to your procedure  If you use a CPAP at night, please bring your mask for your overnight stay.   Contacts, glasses, hearing aids, dentures or partials may not be worn into surgery, please bring cases for these belongings   For patients admitted to the hospital, discharge time will be determined by your treatment team.   Patients discharged the day of surgery will not be allowed to drive home, and someone needs to stay with them for 24 hours.  NO VISITORS WILL BE ALLOWED IN PRE-OP WHERE PATIENTS ARE PREPPED FOR SURGERY.  ONLY 1 SUPPORT PERSON MAY BE PRESENT IN THE WAITING ROOM WHILE YOU ARE IN SURGERY.  IF YOU ARE TO BE ADMITTED, ONCE YOU ARE IN YOUR ROOM YOU WILL BE ALLOWED TWO (2) VISITORS.  1 (ONE) VISITOR MAY STAY OVERNIGHT BUT MUST ARRIVE TO THE ROOM BY 8pm.  Minor children may have two parents present. Special consideration for safety and communication needs will be reviewed on a case by case basis.  Special instructions:    Oral Hygiene is also important to reduce your risk of infection.  Remember - BRUSH YOUR TEETH THE MORNING OF SURGERY WITH YOUR REGULAR TOOTHPASTE   [place]- Preparing For Surgery  Before surgery, you can play an  important role. Because skin is not sterile, your skin needs to be as free of germs as possible. You can reduce the number of germs on your skin by washing with CHG (chlorahexidine gluconate) Soap before surgery.  CHG is an antiseptic cleaner which kills germs and bonds with the skin to continue killing germs even after washing.     Please do not use if you have an allergy to CHG or antibacterial soaps. If your skin becomes reddened/irritated stop using the CHG.  Do not shave (including legs and underarms) for at least 48 hours prior to first CHG shower. It is OK to shave your face.  Please follow these instructions carefully.     Shower the NIGHT BEFORE SURGERY and the MORNING OF SURGERY with CHG Soap.   If you chose to wash your hair, wash your hair first as usual with your normal shampoo. After you shampoo, rinse your hair and body thoroughly to remove the shampoo.    Then [employer] and genitals (private parts) with your normal soap and rinse thoroughly to remove soap.  Next use the CHG Soap as you would any other liquid soap. You can apply CHG directly to the skin and wash gently with a clean washcloth.   Apply the CHG Soap to your body ONLY FROM THE NECK DOWN.  Do not use on open wounds or open sores. Avoid contact with your eyes, ears, mouth and genitals (private parts). Wash Face and genitals (private parts)  with your normal soap.   Wash thoroughly, paying special attention to the area where your surgery will be performed.  Thoroughly rinse your body with warm water from the neck down.  DO NOT shower/wash with your normal soap after using and rinsing off the CHG Soap.  Pat yourself dry with a CLEAN TOWEL.  Wear CLEAN PAJAMAS to bed the night before surgery  Place CLEAN SHEETS on your bed the night before your surgery  DO NOT SLEEP WITH PETS.   Day of Surgery:  Take a shower with CHG soap. Wear Clean/Comfortable clothing the morning of surgery Do not apply any  deodorants/lotions.   Remember to brush your teeth WITH YOUR REGULAR TOOTHPASTE.   Please read over the fact sheets that you were given.

## 2021-07-28 ENCOUNTER — Telehealth: Payer: Self-pay | Admitting: *Deleted

## 2021-07-28 ENCOUNTER — Encounter (HOSPITAL_COMMUNITY): Payer: Self-pay | Admitting: Thoracic Surgery (Cardiothoracic Vascular Surgery)

## 2021-07-28 ENCOUNTER — Other Ambulatory Visit: Payer: Self-pay | Admitting: Thoracic Surgery (Cardiothoracic Vascular Surgery)

## 2021-07-28 ENCOUNTER — Other Ambulatory Visit: Payer: Self-pay

## 2021-07-28 ENCOUNTER — Inpatient Hospital Stay (HOSPITAL_COMMUNITY)
Admission: RE | Admit: 2021-07-28 | Discharge: 2021-07-28 | Disposition: A | Discharge status: Home / Self Care | Payer: Medicare Other | Source: Ambulatory Visit

## 2021-07-28 NOTE — Progress Notes (Addendum)
[name] denies chest pain or shortness of breath at this moment.  Patient denies having any s/s of Covid in his household.  Patient denies any known exposure to Covid.   [name] is complaining of continued pain and n/v. Patient has spoken to 2 nurses and a MD at  Dr. Abran Duke office over the course of 2 days,  this am [name] was instructed to go to the ED, patient checked wqait times and said that there is a long wait in the ED. [name] is asking to be admitted today for pain control. I spoke to Levonne Spiller, RN who spoke with Dr. Kipp Brood who said no, patient cannot be admitted for pain control.   Cardiologist is Dr Casandra Doffing.  I instructed [name] to shower with antibiotic soap, if it is available.  Dry off with a clean towel. Do not put lotion, powder, cologne or deodorant or makeup.No jewelry or piercings. Men may shave their face and neck. Woman should not shave. No nail polish, artificial or acrylic nails. Wear clean clothes, brush your teeth. Glasses, contact lens,dentures or partials may not be worn in the OR. If you need to wear them, please bring a case for glasses, do not wear contacts or bring a case, the hospital does not have contact cases, dentures or partials will have to be removed , make sure they are clean, we will provide a denture cup to put them in. You will need some one to drive you home and a responsible person over the age of 61 to stay with you for the first 24 hours after surgery.   I asked Levonne Spiller, RN if patient may have clear liquids up to 3 hours prior to procedure per anesthesia Pre- OP Orders; Thurmond Butts got back to me, she said patient may have clear liquids up until 1000.  I notified [name] that he may drink until 1000, patient said he will drink water, maybe apple juice.

## 2021-07-28 NOTE — Telephone Encounter (Signed)
Returned phone call to [name]. Per patient, he is not feeling well c/o severe pain and nausea throughout the night. Patient is scheduled for pre admission testing today but states he will be unable to make it. Patient is scheduled to have IBV removal and chest wall mass bx tomorrow by Dr. Kipp Brood. Patient states he is taking compazine for nausea and oxycodone for pain. Per patient, he is taking more medication than prescribed r/t pain. Advised patient that if pain medication isn't helping his pain he can receive care by ED. Advised patient to take all medications as prescribed. Patient aware he will need pre admission testing tomorrow prior to surgery and will report to the hospital tomorrow at 12:30. Patient verbalizes understanding.

## 2021-07-29 ENCOUNTER — Encounter (HOSPITAL_COMMUNITY)
Admission: RE | Disposition: A | Discharge status: Home / Self Care | Payer: Self-pay | Source: Ambulatory Visit | Attending: Thoracic Surgery (Cardiothoracic Vascular Surgery)

## 2021-07-29 ENCOUNTER — Encounter (HOSPITAL_COMMUNITY): Payer: Self-pay | Admitting: Thoracic Surgery (Cardiothoracic Vascular Surgery)

## 2021-07-29 ENCOUNTER — Ambulatory Visit (HOSPITAL_COMMUNITY): Payer: Medicare Other | Admitting: Anesthesiology

## 2021-07-29 ENCOUNTER — Ambulatory Visit (HOSPITAL_COMMUNITY): Payer: Medicare Other

## 2021-07-29 ENCOUNTER — Inpatient Hospital Stay (HOSPITAL_COMMUNITY)
Admission: RE | Admit: 2021-07-29 | Discharge: 2021-08-01 | DRG: 845 | Disposition: A | Discharge status: Home / Self Care | Payer: Medicare Other | Source: Ambulatory Visit | Attending: Thoracic Surgery (Cardiothoracic Vascular Surgery) | Admitting: Thoracic Surgery (Cardiothoracic Vascular Surgery)

## 2021-07-29 DIAGNOSIS — I252 Old myocardial infarction: Secondary | ICD-10-CM

## 2021-07-29 DIAGNOSIS — Z888 Allergy status to other drugs, medicaments and biological substances status: Secondary | ICD-10-CM

## 2021-07-29 DIAGNOSIS — G893 Neoplasm related pain (acute) (chronic): Secondary | ICD-10-CM | POA: Diagnosis present

## 2021-07-29 DIAGNOSIS — R222 Localized swelling, mass and lump, trunk: Secondary | ICD-10-CM | POA: Diagnosis not present

## 2021-07-29 DIAGNOSIS — R911 Solitary pulmonary nodule: Secondary | ICD-10-CM

## 2021-07-29 DIAGNOSIS — Z981 Arthrodesis status: Secondary | ICD-10-CM

## 2021-07-29 DIAGNOSIS — Z5181 Encounter for therapeutic drug level monitoring: Secondary | ICD-10-CM

## 2021-07-29 DIAGNOSIS — C7B8 Other secondary neuroendocrine tumors: Principal | ICD-10-CM | POA: Diagnosis present

## 2021-07-29 DIAGNOSIS — F319 Bipolar disorder, unspecified: Secondary | ICD-10-CM | POA: Diagnosis present

## 2021-07-29 DIAGNOSIS — Z811 Family history of alcohol abuse and dependence: Secondary | ICD-10-CM

## 2021-07-29 DIAGNOSIS — Z20822 Contact with and (suspected) exposure to covid-19: Secondary | ICD-10-CM | POA: Diagnosis present

## 2021-07-29 DIAGNOSIS — Z955 Presence of coronary angioplasty implant and graft: Secondary | ICD-10-CM

## 2021-07-29 DIAGNOSIS — C3490 Malignant neoplasm of unspecified part of unspecified bronchus or lung: Secondary | ICD-10-CM | POA: Diagnosis present

## 2021-07-29 DIAGNOSIS — Z01818 Encounter for other preprocedural examination: Secondary | ICD-10-CM

## 2021-07-29 DIAGNOSIS — Z923 Personal history of irradiation: Secondary | ICD-10-CM

## 2021-07-29 DIAGNOSIS — Z85841 Personal history of malignant neoplasm of brain: Secondary | ICD-10-CM

## 2021-07-29 DIAGNOSIS — Z79891 Long term (current) use of opiate analgesic: Secondary | ICD-10-CM

## 2021-07-29 DIAGNOSIS — Z86711 Personal history of pulmonary embolism: Secondary | ICD-10-CM

## 2021-07-29 DIAGNOSIS — Z85118 Personal history of other malignant neoplasm of bronchus and lung: Secondary | ICD-10-CM

## 2021-07-29 DIAGNOSIS — J95812 Postprocedural air leak: Secondary | ICD-10-CM

## 2021-07-29 DIAGNOSIS — I255 Ischemic cardiomyopathy: Secondary | ICD-10-CM | POA: Diagnosis present

## 2021-07-29 DIAGNOSIS — J309 Allergic rhinitis, unspecified: Secondary | ICD-10-CM | POA: Diagnosis present

## 2021-07-29 DIAGNOSIS — M1990 Unspecified osteoarthritis, unspecified site: Secondary | ICD-10-CM | POA: Diagnosis present

## 2021-07-29 DIAGNOSIS — Z9221 Personal history of antineoplastic chemotherapy: Secondary | ICD-10-CM

## 2021-07-29 DIAGNOSIS — Z6372 Alcoholism and drug addiction in family: Secondary | ICD-10-CM

## 2021-07-29 DIAGNOSIS — Z902 Acquired absence of lung [part of]: Secondary | ICD-10-CM

## 2021-07-29 DIAGNOSIS — F419 Anxiety disorder, unspecified: Secondary | ICD-10-CM | POA: Diagnosis present

## 2021-07-29 DIAGNOSIS — N183 Chronic kidney disease, stage 3 unspecified: Secondary | ICD-10-CM | POA: Diagnosis present

## 2021-07-29 DIAGNOSIS — Z66 Do not resuscitate: Secondary | ICD-10-CM | POA: Diagnosis present

## 2021-07-29 DIAGNOSIS — I2510 Atherosclerotic heart disease of native coronary artery without angina pectoris: Secondary | ICD-10-CM | POA: Diagnosis present

## 2021-07-29 DIAGNOSIS — Z801 Family history of malignant neoplasm of trachea, bronchus and lung: Secondary | ICD-10-CM

## 2021-07-29 DIAGNOSIS — Z951 Presence of aortocoronary bypass graft: Secondary | ICD-10-CM

## 2021-07-29 DIAGNOSIS — M109 Gout, unspecified: Secondary | ICD-10-CM | POA: Diagnosis present

## 2021-07-29 DIAGNOSIS — I129 Hypertensive chronic kidney disease with stage 1 through stage 4 chronic kidney disease, or unspecified chronic kidney disease: Secondary | ICD-10-CM | POA: Diagnosis present

## 2021-07-29 DIAGNOSIS — Z8673 Personal history of transient ischemic attack (TIA), and cerebral infarction without residual deficits: Secondary | ICD-10-CM

## 2021-07-29 DIAGNOSIS — K219 Gastro-esophageal reflux disease without esophagitis: Secondary | ICD-10-CM | POA: Diagnosis present

## 2021-07-29 DIAGNOSIS — Z8249 Family history of ischemic heart disease and other diseases of the circulatory system: Secondary | ICD-10-CM

## 2021-07-29 DIAGNOSIS — Z85828 Personal history of other malignant neoplasm of skin: Secondary | ICD-10-CM

## 2021-07-29 DIAGNOSIS — E7800 Pure hypercholesterolemia, unspecified: Secondary | ICD-10-CM | POA: Diagnosis present

## 2021-07-29 DIAGNOSIS — Z87891 Personal history of nicotine dependence: Secondary | ICD-10-CM

## 2021-07-29 HISTORY — PX: VIDEO BRONCHOSCOPY WITH INSERTION OF INTERBRONCHIAL VALVE (IBV): SHX6178

## 2021-07-29 HISTORY — DX: Personal history of other medical treatment: Z92.89

## 2021-07-29 HISTORY — DX: Anemia, unspecified: D64.9

## 2021-07-29 HISTORY — PX: BIOPSY OF MEDIASTINAL MASS: SHX6389

## 2021-07-29 HISTORY — DX: Pneumonia, unspecified organism: J18.9

## 2021-07-29 LAB — COMPREHENSIVE METABOLIC PANEL
ALT: 26 U/L (ref 0–44)
AST: 26 U/L (ref 15–41)
Albumin: 4 g/dL (ref 3.5–5.0)
Alkaline Phosphatase: 89 U/L (ref 38–126)
Anion gap: 11 (ref 5–15)
BUN: 19 mg/dL (ref 8–23)
CO2: 24 mmol/L (ref 22–32)
Calcium: 10.2 mg/dL (ref 8.9–10.3)
Chloride: 102 mmol/L (ref 98–111)
Creatinine, Ser: 1.3 mg/dL — ABNORMAL HIGH (ref 0.61–1.24)
GFR, Estimated: >60 mL/min (ref >60)
Glucose, Bld: 119 mg/dL — ABNORMAL HIGH (ref 70–99)
Potassium: 4 mmol/L (ref 3.5–5.1)
Sodium: 137 mmol/L (ref 135–145)
Total Bilirubin: 0.5 mg/dL (ref 0.3–1.2)
Total Protein: 7.4 g/dL (ref 6.5–8.1)

## 2021-07-29 LAB — CBC
HCT: 41.9 % (ref 39.0–52.0)
Hemoglobin: 13.8 g/dL (ref 13.0–17.0)
MCH: 30.7 pg (ref 26.0–34.0)
MCHC: 32.9 g/dL (ref 30.0–36.0)
MCV: 93.3 fL (ref 80.0–100.0)
Platelets: 233 10*3/uL (ref 150–400)
RBC: 4.49 MIL/uL (ref 4.22–5.81)
RDW: 12.5 % (ref 11.5–15.5)
WBC: 7.7 10*3/uL (ref 4.0–10.5)
nRBC: 0 % (ref 0.0–0.2)

## 2021-07-29 LAB — PROTIME-INR
INR: 1 (ref 0.8–1.2)
Prothrombin Time: 12.9 seconds (ref 11.4–15.2)

## 2021-07-29 LAB — SARS CORONAVIRUS 2 BY RT PCR (HOSPITAL ORDER, PERFORMED IN ~~LOC~~ HOSPITAL LAB): SARS Coronavirus 2: NEGATIVE

## 2021-07-29 LAB — APTT: aPTT: 45 seconds — ABNORMAL HIGH (ref 24–36)

## 2021-07-29 SURGERY — BRONCHOSCOPY, FLEXIBLE, WITH INTRABRONCHIAL VALVE INSERTION
Anesthesia: General | Laterality: Right

## 2021-07-29 MED ORDER — CEFAZOLIN SODIUM 1 G IJ SOLR
INTRAMUSCULAR | Status: AC
  Filled 2021-07-29: qty 20

## 2021-07-29 MED ORDER — ASPIRIN EC 81 MG PO TBEC
81.0000 mg | DELAYED_RELEASE_TABLET | Freq: Every day | ORAL | Status: DC
  Administered 2021-07-29 – 2021-08-01 (×4): 81 mg via ORAL
  Filled 2021-07-29 (×4): qty 1

## 2021-07-29 MED ORDER — PROCHLORPERAZINE MALEATE 10 MG PO TABS
10.0000 mg | ORAL_TABLET | Freq: Four times a day (QID) | ORAL | Status: DC | PRN
  Filled 2021-07-29: qty 1

## 2021-07-29 MED ORDER — EPHEDRINE 5 MG/ML INJ
INTRAVENOUS | Status: AC
  Filled 2021-07-29: qty 5

## 2021-07-29 MED ORDER — MIDODRINE HCL 5 MG PO TABS: 2.5000 mg | ORAL_TABLET | Freq: Three times a day (TID) | ORAL | Status: DC | PRN

## 2021-07-29 MED ORDER — OXYCODONE HCL 5 MG PO TABS
10.0000 mg | ORAL_TABLET | ORAL | Status: DC | PRN
  Administered 2021-07-29 – 2021-07-31 (×3): 10 mg via ORAL
  Filled 2021-07-29 (×5): qty 2

## 2021-07-29 MED ORDER — CHLORHEXIDINE GLUCONATE 0.12 % MT SOLN
OROMUCOSAL | Status: AC
  Administered 2021-07-29: 15 mL
  Filled 2021-07-29: qty 15

## 2021-07-29 MED ORDER — FERROUS GLUCONATE 324 (38 FE) MG PO TABS
324.0000 mg | ORAL_TABLET | Freq: Every day | ORAL | Status: DC
  Administered 2021-07-30 – 2021-08-01 (×3): 324 mg via ORAL
  Filled 2021-07-29 (×3): qty 1

## 2021-07-29 MED ORDER — PHENYLEPHRINE HCL-NACL 20-0.9 MG/250ML-% IV SOLN
INTRAVENOUS | Status: DC | PRN
  Administered 2021-07-29: 50 ug/min via INTRAVENOUS

## 2021-07-29 MED ORDER — EPHEDRINE SULFATE-NACL 50-0.9 MG/10ML-% IV SOSY
PREFILLED_SYRINGE | INTRAVENOUS | Status: DC | PRN
  Administered 2021-07-29: 5 mg via INTRAVENOUS
  Administered 2021-07-29: 10 mg via INTRAVENOUS

## 2021-07-29 MED ORDER — CEFAZOLIN SODIUM-DEXTROSE 2-3 GM-%(50ML) IV SOLR
INTRAVENOUS | Status: DC | PRN
  Administered 2021-07-29: 2 g via INTRAVENOUS

## 2021-07-29 MED ORDER — MIDAZOLAM HCL 2 MG/2ML IJ SOLN
INTRAMUSCULAR | Status: AC
  Filled 2021-07-29: qty 2

## 2021-07-29 MED ORDER — FENTANYL CITRATE (PF) 100 MCG/2ML IJ SOLN
25.0000 ug | INTRAMUSCULAR | Status: DC | PRN
  Administered 2021-07-29 (×2): 50 ug via INTRAVENOUS

## 2021-07-29 MED ORDER — FENTANYL CITRATE (PF) 250 MCG/5ML IJ SOLN
INTRAMUSCULAR | Status: AC
  Filled 2021-07-29: qty 5

## 2021-07-29 MED ORDER — ONDANSETRON HCL 4 MG/2ML IJ SOLN
INTRAMUSCULAR | Status: DC | PRN
  Administered 2021-07-29: 4 mg via INTRAVENOUS

## 2021-07-29 MED ORDER — PHENYLEPHRINE 40 MCG/ML (10ML) SYRINGE FOR IV PUSH (FOR BLOOD PRESSURE SUPPORT)
PREFILLED_SYRINGE | INTRAVENOUS | Status: AC
  Filled 2021-07-29: qty 10

## 2021-07-29 MED ORDER — ATORVASTATIN CALCIUM 80 MG PO TABS
80.0000 mg | ORAL_TABLET | Freq: Every day | ORAL | Status: DC
  Administered 2021-07-30 – 2021-08-01 (×3): 80 mg via ORAL
  Filled 2021-07-29 (×3): qty 1

## 2021-07-29 MED ORDER — RANOLAZINE ER 500 MG PO TB12
1000.0000 mg | ORAL_TABLET | Freq: Two times a day (BID) | ORAL | Status: DC
  Administered 2021-07-29 – 2021-08-01 (×6): 1000 mg via ORAL
  Filled 2021-07-29 (×6): qty 2

## 2021-07-29 MED ORDER — VASOPRESSIN 20 UNIT/ML IV SOLN
INTRAVENOUS | Status: DC | PRN
  Administered 2021-07-29: 1 [IU] via INTRAVENOUS

## 2021-07-29 MED ORDER — DOCUSATE SODIUM 100 MG PO CAPS: 100.0000 mg | ORAL_CAPSULE | Freq: Every day | ORAL | Status: DC

## 2021-07-29 MED ORDER — BUPIVACAINE LIPOSOME 1.3 % IJ SUSP
INTRAMUSCULAR | Status: AC
  Filled 2021-07-29: qty 20

## 2021-07-29 MED ORDER — ROCURONIUM BROMIDE 10 MG/ML (PF) SYRINGE
PREFILLED_SYRINGE | INTRAVENOUS | Status: DC | PRN
  Administered 2021-07-29: 60 mg via INTRAVENOUS

## 2021-07-29 MED ORDER — MIDAZOLAM HCL 2 MG/2ML IJ SOLN
INTRAMUSCULAR | Status: DC | PRN
  Administered 2021-07-29: 2 mg via INTRAVENOUS

## 2021-07-29 MED ORDER — HYDROMORPHONE HCL 1 MG/ML IJ SOLN
1.0000 mg | INTRAMUSCULAR | Status: DC | PRN
  Administered 2021-07-29 – 2021-07-31 (×11): 1 mg via INTRAVENOUS
  Filled 2021-07-29 (×11): qty 1

## 2021-07-29 MED ORDER — PROPOFOL 10 MG/ML IV BOLUS
INTRAVENOUS | Status: AC
  Filled 2021-07-29: qty 20

## 2021-07-29 MED ORDER — LIDOCAINE 2% (20 MG/ML) 5 ML SYRINGE
INTRAMUSCULAR | Status: DC | PRN
  Administered 2021-07-29: 40 mg via INTRAVENOUS

## 2021-07-29 MED ORDER — 0.9 % SODIUM CHLORIDE (POUR BTL) OPTIME
TOPICAL | Status: DC | PRN
  Administered 2021-07-29 (×2): 1000 mL

## 2021-07-29 MED ORDER — SUGAMMADEX SODIUM 200 MG/2ML IV SOLN
INTRAVENOUS | Status: DC | PRN
  Administered 2021-07-29: 200 mg via INTRAVENOUS

## 2021-07-29 MED ORDER — BUPIVACAINE LIPOSOME 1.3 % IJ SUSP
INTRAMUSCULAR | Status: DC | PRN
  Administered 2021-07-29: 50 mL

## 2021-07-29 MED ORDER — DEXAMETHASONE SODIUM PHOSPHATE 10 MG/ML IJ SOLN
INTRAMUSCULAR | Status: DC | PRN
  Administered 2021-07-29: 5 mg via INTRAVENOUS

## 2021-07-29 MED ORDER — LIDOCAINE 2% (20 MG/ML) 5 ML SYRINGE
INTRAMUSCULAR | Status: AC
  Filled 2021-07-29: qty 5

## 2021-07-29 MED ORDER — ROCURONIUM BROMIDE 10 MG/ML (PF) SYRINGE
PREFILLED_SYRINGE | INTRAVENOUS | Status: AC
  Filled 2021-07-29: qty 10

## 2021-07-29 MED ORDER — CHLORHEXIDINE GLUCONATE 0.12 % MT SOLN: 15.0000 mL | Freq: Once | OROMUCOSAL | Status: DC

## 2021-07-29 MED ORDER — PHENYLEPHRINE 40 MCG/ML (10ML) SYRINGE FOR IV PUSH (FOR BLOOD PRESSURE SUPPORT)
PREFILLED_SYRINGE | INTRAVENOUS | Status: DC | PRN
  Administered 2021-07-29: 160 ug via INTRAVENOUS
  Administered 2021-07-29 (×2): 120 ug via INTRAVENOUS

## 2021-07-29 MED ORDER — RIVAROXABAN 20 MG PO TABS
20.0000 mg | ORAL_TABLET | Freq: Every evening | ORAL | Status: DC
  Administered 2021-07-30 – 2021-07-31 (×2): 20 mg via ORAL
  Filled 2021-07-29 (×3): qty 1

## 2021-07-29 MED ORDER — ONDANSETRON HCL 4 MG PO TABS
8.0000 mg | ORAL_TABLET | Freq: Three times a day (TID) | ORAL | Status: DC | PRN
  Administered 2021-07-31: 8 mg via ORAL
  Filled 2021-07-29: qty 2

## 2021-07-29 MED ORDER — DULOXETINE HCL 30 MG PO CPEP
30.0000 mg | ORAL_CAPSULE | Freq: Every evening | ORAL | Status: DC
  Administered 2021-07-29 – 2021-07-30 (×2): 30 mg via ORAL
  Filled 2021-07-29 (×2): qty 1

## 2021-07-29 MED ORDER — METOPROLOL SUCCINATE ER 25 MG PO TB24
25.0000 mg | ORAL_TABLET | Freq: Once | ORAL | Status: AC
  Administered 2021-07-29: 25 mg via ORAL

## 2021-07-29 MED ORDER — BUPIVACAINE HCL (PF) 0.5 % IJ SOLN
INTRAMUSCULAR | Status: AC
  Filled 2021-07-29: qty 30

## 2021-07-29 MED ORDER — ONDANSETRON HCL 4 MG/2ML IJ SOLN
INTRAMUSCULAR | Status: AC
  Filled 2021-07-29: qty 2

## 2021-07-29 MED ORDER — LACTATED RINGERS IV SOLN: INTRAVENOUS | Status: DC

## 2021-07-29 MED ORDER — NITROGLYCERIN 0.4 MG SL SUBL: 0.4000 mg | SUBLINGUAL_TABLET | SUBLINGUAL | Status: DC | PRN

## 2021-07-29 MED ORDER — DEXAMETHASONE SODIUM PHOSPHATE 10 MG/ML IJ SOLN
INTRAMUSCULAR | Status: AC
  Filled 2021-07-29: qty 1

## 2021-07-29 MED ORDER — FENTANYL CITRATE (PF) 250 MCG/5ML IJ SOLN
INTRAMUSCULAR | Status: DC | PRN
  Administered 2021-07-29: 50 ug via INTRAVENOUS
  Administered 2021-07-29 (×2): 75 ug via INTRAVENOUS
  Administered 2021-07-29: 50 ug via INTRAVENOUS

## 2021-07-29 MED ORDER — METOPROLOL SUCCINATE ER 25 MG PO TB24
ORAL_TABLET | ORAL | Status: AC
  Filled 2021-07-29: qty 1

## 2021-07-29 MED ORDER — LACTATED RINGERS IV SOLN: INTRAVENOUS | Status: DC | PRN

## 2021-07-29 MED ORDER — VASOPRESSIN 20 UNIT/ML IV SOLN
INTRAVENOUS | Status: AC
  Filled 2021-07-29: qty 1

## 2021-07-29 MED ORDER — FENTANYL CITRATE (PF) 100 MCG/2ML IJ SOLN
INTRAMUSCULAR | Status: AC
  Filled 2021-07-29: qty 2

## 2021-07-29 MED ORDER — ALBUMIN HUMAN 5 % IV SOLN: INTRAVENOUS | Status: DC | PRN

## 2021-07-29 MED ORDER — PROPOFOL 10 MG/ML IV BOLUS
INTRAVENOUS | Status: DC | PRN
  Administered 2021-07-29: 170 mg via INTRAVENOUS

## 2021-07-29 SURGICAL SUPPLY — 55 items
ADAPTER VALVE BIOPSY EBUS (MISCELLANEOUS) IMPLANT
ADH SKN CLS APL DERMABOND .7 (GAUZE/BANDAGES/DRESSINGS) ×2
ADPTR VALVE BIOPSY EBUS (MISCELLANEOUS)
BLADE CLIPPER SURG (BLADE) IMPLANT
BLADE SURG 11 STRL SS (BLADE) ×3 IMPLANT
BLADE SURG 15 STRL LF DISP TIS (BLADE) ×2 IMPLANT
BLADE SURG 15 STRL SS (BLADE) ×3
CANISTER SUCT 3000ML PPV (MISCELLANEOUS) ×3 IMPLANT
CNTNR URN SCR LID CUP LEK RST (MISCELLANEOUS) ×2 IMPLANT
CONT SPEC 4OZ STRL OR WHT (MISCELLANEOUS) ×3
COVER BACK TABLE 60X90IN (DRAPES) ×3 IMPLANT
COVER SURGICAL LIGHT HANDLE (MISCELLANEOUS) ×3 IMPLANT
DERMABOND ADVANCED (GAUZE/BANDAGES/DRESSINGS) ×1
DERMABOND ADVANCED .7 DNX12 (GAUZE/BANDAGES/DRESSINGS) ×2 IMPLANT
FILTER STRAW FLUID ASPIR (MISCELLANEOUS) IMPLANT
FORCEPS BIOP RJ4 1.8 (CUTTING FORCEPS) IMPLANT
FORCEPS RADIAL JAW LRG 4 PULM (INSTRUMENTS) ×2 IMPLANT
GAUZE SPONGE 4X4 12PLY STRL (GAUZE/BANDAGES/DRESSINGS) ×3 IMPLANT
GLOVE SURG ENC MOIS LTX SZ7 (GLOVE) ×3 IMPLANT
GLOVE SURG ENC MOIS LTX SZ7.5 (GLOVE) ×3 IMPLANT
GLOVE SURG PR MICRO ENCORE 7.5 (GLOVE) ×6 IMPLANT
GOWN STRL REUS W/ TWL XL LVL3 (GOWN DISPOSABLE) ×4 IMPLANT
GOWN STRL REUS W/TWL LRG LVL4 (GOWN DISPOSABLE) ×6 IMPLANT
GOWN STRL REUS W/TWL XL LVL3 (GOWN DISPOSABLE) ×6
KIT CLEAN ENDO COMPLIANCE (KITS) ×3 IMPLANT
KIT TURNOVER KIT B (KITS) ×3 IMPLANT
MARKER SKIN DUAL TIP RULER LAB (MISCELLANEOUS) ×3 IMPLANT
NDL SAFETY ECLIPSE 18X1.5 (NEEDLE) ×2 IMPLANT
NEEDLE BEVEL SYR 25X1 (NEEDLE) ×3 IMPLANT
NEEDLE HYPO 18GX1.5 SHARP (NEEDLE) ×3
NS IRRIG 1000ML POUR BTL (IV SOLUTION) ×3 IMPLANT
OIL SILICONE PENTAX (PARTS (SERVICE/REPAIRS)) ×3 IMPLANT
PAD ARMBOARD 7.5X6 YLW CONV (MISCELLANEOUS) ×6 IMPLANT
PENCIL BUTTON HOLSTER BLD 10FT (ELECTRODE) ×3 IMPLANT
RADIAL JAW LRG 4 PULMONARY (INSTRUMENTS) ×1
STOPCOCK MORSE 400PSI 3WAY (MISCELLANEOUS) ×3 IMPLANT
SUT SILK  1 MH (SUTURE) ×3
SUT SILK 1 MH (SUTURE) ×2 IMPLANT
SUT VIC AB 3-0 SH 27 (SUTURE) ×3
SUT VIC AB 3-0 SH 27X BRD (SUTURE) ×2 IMPLANT
SUT VICRYL 0 UR6 27IN ABS (SUTURE) ×3 IMPLANT
SYR 10ML LL (SYRINGE) IMPLANT
SYR 20ML ECCENTRIC (SYRINGE) ×3 IMPLANT
SYR 30ML LL (SYRINGE) ×3 IMPLANT
SYR BULB IRRIG 60ML STRL (SYRINGE) ×3 IMPLANT
TOWEL GREEN STERILE (TOWEL DISPOSABLE) ×3 IMPLANT
TOWEL GREEN STERILE FF (TOWEL DISPOSABLE) ×3 IMPLANT
TOWEL NATURAL 4PK STERILE (DISPOSABLE) IMPLANT
TRAP SPECIMEN MUCUS 40CC (MISCELLANEOUS) ×3 IMPLANT
TUBE CONNECTING 20X1/4 (TUBING) ×3 IMPLANT
UNDERPAD 30X36 HEAVY ABSORB (UNDERPADS AND DIAPERS) ×3 IMPLANT
VALVE BIOPSY  SINGLE USE (MISCELLANEOUS) ×3
VALVE BIOPSY SINGLE USE (MISCELLANEOUS) ×2 IMPLANT
VALVE SUCTION BRONCHIO DISP (MISCELLANEOUS) ×3 IMPLANT
WATER STERILE IRR 1000ML POUR (IV SOLUTION) IMPLANT

## 2021-07-29 NOTE — Anesthesia Postprocedure Evaluation (Signed)
Anesthesia Post Note  Patient: [name]  Procedure(s) Performed: VIDEO BRONCHOSCOPY WITH REMOVAL OF INTERBRONCHIAL VALVE (IBV) RIGHT CHEST WALL MASS BIOPSY (Right)     Patient location during evaluation: PACU Anesthesia Type: General Level of consciousness: awake Pain management: pain level controlled Vital Signs Assessment: post-procedure vital signs reviewed and stable Respiratory status: spontaneous breathing Cardiovascular status: stable Postop Assessment: no apparent nausea or vomiting Anesthetic complications: no   No notable events documented.  Last Vitals:  Vitals:   07/29/21 1618 07/29/21 1650  BP: 125/71 130/74  Pulse: 76 80  Resp: 12 16  Temp: 36.5 C   SpO2: 100% 100%    Last Pain:  Vitals:   07/29/21 1548  TempSrc:   PainSc: 6                  Shawnte Demarest

## 2021-07-29 NOTE — Discharge Instructions (Addendum)
Discharge Instructions:  1. You may shower, please wash incisions daily with soap and water and keep dry.  If you wish to cover wounds with dressing you may do so but please keep clean and change daily.  No tub baths or swimming until incisions have completely healed.  If your incisions become red or develop any drainage please call our office at [phone]  4. Fever of 101.5 for at least 24 hours with no source, please contact our office at [phone]  5. Activity- up as tolerated, please walk at least 3 times per day.  Avoid strenuous activity, no lifting, pushing, or pulling with your arms over 8-10 lbs for one week  6. If any questions or concerns arise, please do not hesitate to contact our office at [phone]  Medicare Outpatient Observation Notice   Patient name:  [name] Patient number:  [id number]                                                                                                                                                                       [place] a hospital outpatient receiving observation services. You are not an inpatient because:    Lung Cancer   You require hospital care for evaluation and/or treatment.  It is expected you will need hospital care for less than a total of two days.                                                                                                                                                                         Being an outpatient may affect what you pay in a hospital:   When you're a hospital outpatient, your observation stay is covered under Medicare Part B.   For Part B services, you generally pay:   A copayment for each outpatient hospital service you get. Part B copayments may vary by type of service.   20% of the Medicare-approved amount for most doctor services, after the Part  B deductible.   Observation services may affect coverage and payment of your care after you leave the hospital:      If you need skilled nursing facility (SNF) care after you leave the hospital, Medicare Part A will only cover SNF care if you've had a 3-day minimum, medically necessary, inpatient hospital stay for a related illness or injury. An inpatient hospital stay begins the day the hospital admits you as an inpatient based on a doctor's order and doesn't include the day you're discharged.   If you have Medicaid, a Medicare Advantage plan or other health plan, Medicaid or the plan may have different rules for SNF coverage after you leave the hospital. Check with Medicaid or your plan.   NOTE: Medicare Part A generally doesn't cover outpatient hospital services, like an observation stay. However, Part A will generally cover medically necessary inpatient services if the hospital admits you as an inpatient based on a doctor's order. In most cases, you'll pay a one-time deductible for all of your inpatient hospital services for the first 60 days you're in a hospital.                                                                                                                                                                      If you have any questions about your observation services, ask the hospital staff member giving you this notice or the doctor providing your hospital care. You can also ask to speak with someone from the hospital's utilization or discharge planning department.   You can also call 1-800-MEDICARE ([phone] 488-8153).  TT[employer] users should call [phone].   Form CMS 12878-[id number]   Expiration 08/29/2021 [employer] APPROVAL [id number]          Your costs for medications:     Generally, prescription and over-the-counter drugs, including "self-administered drugs," you get in a hospital outpatient setting (like an emergency department) aren't covered by Part B. "Self- administered drugs" are drugs you'd normally take on your own. For safety reasons, many hospitals don't allow you to take medications  brought from home. If you have a Medicare prescription drug plan (Part D), your plan may help you pay for these drugs. You'll likely need to pay out-of- pocket for these drugs and submit a claim to your drug plan for a refund. Contact your drug plan for more information.  If you're enrolled in a Medicare Advantage plan (like an HMO or PPO) or other Medicare health plan (Part C), your costs and coverage may be different. Check with your plan to find out about coverage for outpatient observation services.    If you're a Qualified Medicare Beneficiary through your state Medicaid program, you can't be billed for Part A or Part B deductibles, coinsurance, and copayments.                                                                                                                                                                      Additional Information (Optional):                                                                                                                                                                                Please sign below to show you received and understand this notice.                                         Date: 07/30/21 / Time:9:20 AM   CMS does not discriminate in its programs and activities. To request this publication in alternative format, please call: 1-800-MEDICARE or email:[email or web address].   Form CMS 56213-YQMV   Expiration 08/29/2021 [employer] APPROVAL [id number]      Patient   Add No image attached Trace Slow Corrupt Edit Data Change Template Print On

## 2021-07-29 NOTE — Transfer of Care (Signed)
Immediate Anesthesia Transfer of Care Note  Patient: [name]  Procedure(s) Performed: VIDEO BRONCHOSCOPY WITH REMOVAL OF INTERBRONCHIAL VALVE (IBV) RIGHT CHEST WALL MASS BIOPSY (Right)  Patient Location: PACU  Anesthesia Type:General  Level of Consciousness: awake, alert  and oriented  Airway & Oxygen Therapy: Patient Spontanous Breathing  Post-op Assessment: Report given to RN and Post -op Vital signs reviewed and stable  Post vital signs: Reviewed and stable  Last Vitals:  Vitals Value Taken Time  BP 121/71 07/29/21 1533  Temp    Pulse 77 07/29/21 1535  Resp 12 07/29/21 1535  SpO2 100 % 07/29/21 1535  Vitals shown include unvalidated device data.  Last Pain:  Vitals:   07/29/21 1306  TempSrc:   PainSc: 8       Patients Stated Pain Goal: 3 (03/88/82 8003)  Complications: No notable events documented.

## 2021-07-29 NOTE — Op Note (Signed)
     [address]       [place] [address]             [id number]        07/29/2021  Patient: KAYDEN HUTMACHER Pre-operative Diagnosis: Status post right upper lobectomy complicated by prolonged air leak. History of endobronchial valve placement x2 Chest wall nodule Post-operative Diagnosis: Same   Procedure(s): removal of 9 mm endobronchial valve Excision of chest wall mass  Surgeon(s) and Role:    * Maston Wight, Lucile Crater, MD - Primary  Anesthesia  General EBL: minimal  Blood Administered: none Specimen: Chest wall nodule  Indication: Mr. 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  This hospitalization was complicated by prolonged air leak which required placement of endobronchial valves.  He has completed his course of adjuvant therapy but has developed new onset chest wall pain close to the previous incisions and had a palpable nodule underneath the assistant port.  He comes to the operating room today for removal of the endobronchial valves, and excision of this chest wall nodule.  Findings: During bronchoscopy the 9 mm valve was epithelialized, but we were able to remove it.  7 mm valve was in a very difficult location and after multiple attempts to remove it this was unsuccessful.  There was some slight mucosal bleeding which obstructing our view.  For the purposes of safety I elected to abort removal of the 7 mm valve.  In regards to the chest wall excision the nodule was easily palpable.  A 3 cm elliptical incision was made around it does not extend excise all the way down to the level of the muscle.  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  Operative Technique: Patient was brought to the operating theater placed in supine position.  Anesthesia was induced.  Pre-operative antibiotics were dose, and the patient was prepped and draped in normal sterile  fashion.  An appropriate surgical pause was performed.      Flexible fiberoptic bronchoscopy was performed via the endotracheal tube.  It revealed normal endobronchial anatomy with no endobronchial lesions to the level of the subsegmental bronchi.  The 2 endobronchial valves were evident but had significant ingrowth of mucosal tissue.  I was able to grab the 9 mm endobronchial valve and removed without any complications.  7 mm endobronchial valve was in a difficult location or not able to make an angle to grasp it safely.  We did encounter some bleeding that was controlled with cold irrigation.  At this point I elected to abort removal of this valve given the poor visualization.  Bronchoscopic exam was performed for completion evaluation of evidence of any ongoing bleeding.  The patient was then placed and a left lateral decubitus position.  The area was then prepped and draped in normal sterile fashion.  A 3 cm elliptical incision was made around the nodule, and it was excised.  A combination of Bovie cautery and blunt dissection was used and we dissected all the way down to the level of the muscle.  This was passed off the specimen.  This incision was then closed between several layers of absorbable suture.   The patient was extubated in the operating room and taken to the PACU in good condition.

## 2021-07-29 NOTE — Interval H&P Note (Signed)
History and Physical Interval Note:  07/29/2021 1:41 PM  [name]  has presented today for surgery, with the diagnosis of RIGHT CHEST WALL LESION.  The various methods of treatment have been discussed with the patient and family. After consideration of risks, benefits and other options for treatment, the patient has consented to  Procedure(s): VIDEO BRONCHOSCOPY WITH REMOVAL OF INTERBRONCHIAL VALVE (IBV) (N/A) RIGHT CHEST WALL MASS BIOPSY (Right) as a surgical intervention.  The patient's history has been reviewed, patient examined, no change in status, stable for surgery.  I have reviewed the patient's chart and labs.  Questions were answered to the patient's satisfaction.     [name]

## 2021-07-29 NOTE — Anesthesia Procedure Notes (Signed)
Procedure Name: Intubation Date/Time: 07/29/2021 2:10 PM Performed by: Carolan Clines, CRNA Pre-anesthesia Checklist: Patient identified, Emergency Drugs available, Suction available and Patient being monitored Patient Re-evaluated:Patient Re-evaluated prior to induction Oxygen Delivery Method: Circle System Utilized Preoxygenation: Pre-oxygenation with 100% oxygen Induction Type: IV induction Ventilation: Mask ventilation without difficulty and Oral airway inserted - appropriate to patient size Laryngoscope Size: Mac and 4 Grade View: Grade I Tube type: Oral Tube size: 8.5 mm Number of attempts: 1 Airway Equipment and Method: Stylet and Oral airway Placement Confirmation: ETT inserted through vocal cords under direct vision, positive ETCO2 and breath sounds checked- equal and bilateral Secured at: 22 cm Tube secured with: Tape Dental Injury: Teeth and Oropharynx as per pre-operative assessment

## 2021-07-29 NOTE — Anesthesia Preprocedure Evaluation (Signed)
Anesthesia Evaluation  Patient identified by MRN, date of birth, ID band Patient awake    Reviewed: Allergy & Precautions, NPO status , Patient's Chart, lab work & pertinent test results  History of Anesthesia Complications (+) Family history of anesthesia reaction  Airway Mallampati: II       Dental   Pulmonary shortness of breath, pneumonia, former smoker,    breath sounds clear to auscultation       Cardiovascular hypertension, + angina + CAD and + Past MI   Rhythm:Regular Rate:Normal     Neuro/Psych  Headaches,  Neuromuscular disease CVA    GI/Hepatic GERD  ,(+) Hepatitis -  Endo/Other    Renal/GU Renal disease     Musculoskeletal  (+) Arthritis , Fibromyalgia -  Abdominal   Peds  Hematology   Anesthesia Other Findings   Reproductive/Obstetrics                             Anesthesia Physical Anesthesia Plan  ASA: 3  Anesthesia Plan: General   Post-op Pain Management:    Induction:   PONV Risk Score and Plan: 2 and Ondansetron, Dexamethasone and Midazolam  Airway Management Planned: Oral ETT  Additional Equipment:   Intra-op Plan:   Post-operative Plan: Possible Post-op intubation/ventilation  Informed Consent: I have reviewed the patients History and Physical, chart, labs and discussed the procedure including the risks, benefits and alternatives for the proposed anesthesia with the patient or authorized representative who has indicated his/her understanding and acceptance.       Plan Discussed with: CRNA and Anesthesiologist  Anesthesia Plan Comments:         Anesthesia Quick Evaluation

## 2021-07-30 ENCOUNTER — Encounter (HOSPITAL_COMMUNITY): Payer: Self-pay | Admitting: Thoracic Surgery (Cardiothoracic Vascular Surgery)

## 2021-07-30 DIAGNOSIS — C3490 Malignant neoplasm of unspecified part of unspecified bronchus or lung: Secondary | ICD-10-CM | POA: Diagnosis present

## 2021-07-30 DIAGNOSIS — F419 Anxiety disorder, unspecified: Secondary | ICD-10-CM | POA: Diagnosis present

## 2021-07-30 DIAGNOSIS — Z801 Family history of malignant neoplasm of trachea, bronchus and lung: Secondary | ICD-10-CM | POA: Diagnosis not present

## 2021-07-30 DIAGNOSIS — I252 Old myocardial infarction: Secondary | ICD-10-CM | POA: Diagnosis not present

## 2021-07-30 DIAGNOSIS — Z85118 Personal history of other malignant neoplasm of bronchus and lung: Secondary | ICD-10-CM | POA: Diagnosis not present

## 2021-07-30 DIAGNOSIS — E7800 Pure hypercholesterolemia, unspecified: Secondary | ICD-10-CM | POA: Diagnosis present

## 2021-07-30 DIAGNOSIS — Z515 Encounter for palliative care: Secondary | ICD-10-CM | POA: Diagnosis not present

## 2021-07-30 DIAGNOSIS — I2510 Atherosclerotic heart disease of native coronary artery without angina pectoris: Secondary | ICD-10-CM | POA: Diagnosis present

## 2021-07-30 DIAGNOSIS — M1990 Unspecified osteoarthritis, unspecified site: Secondary | ICD-10-CM | POA: Diagnosis present

## 2021-07-30 DIAGNOSIS — C3411 Malignant neoplasm of upper lobe, right bronchus or lung: Secondary | ICD-10-CM | POA: Diagnosis not present

## 2021-07-30 DIAGNOSIS — Z6372 Alcoholism and drug addiction in family: Secondary | ICD-10-CM | POA: Diagnosis not present

## 2021-07-30 DIAGNOSIS — K219 Gastro-esophageal reflux disease without esophagitis: Secondary | ICD-10-CM | POA: Diagnosis present

## 2021-07-30 DIAGNOSIS — N183 Chronic kidney disease, stage 3 unspecified: Secondary | ICD-10-CM | POA: Diagnosis present

## 2021-07-30 DIAGNOSIS — M109 Gout, unspecified: Secondary | ICD-10-CM | POA: Diagnosis present

## 2021-07-30 DIAGNOSIS — C7B8 Other secondary neuroendocrine tumors: Secondary | ICD-10-CM | POA: Diagnosis present

## 2021-07-30 DIAGNOSIS — Z85828 Personal history of other malignant neoplasm of skin: Secondary | ICD-10-CM | POA: Diagnosis not present

## 2021-07-30 DIAGNOSIS — Z79891 Long term (current) use of opiate analgesic: Secondary | ICD-10-CM | POA: Diagnosis not present

## 2021-07-30 DIAGNOSIS — I129 Hypertensive chronic kidney disease with stage 1 through stage 4 chronic kidney disease, or unspecified chronic kidney disease: Secondary | ICD-10-CM | POA: Diagnosis present

## 2021-07-30 DIAGNOSIS — G893 Neoplasm related pain (acute) (chronic): Secondary | ICD-10-CM | POA: Diagnosis present

## 2021-07-30 DIAGNOSIS — F319 Bipolar disorder, unspecified: Secondary | ICD-10-CM | POA: Diagnosis present

## 2021-07-30 DIAGNOSIS — Z20822 Contact with and (suspected) exposure to covid-19: Secondary | ICD-10-CM | POA: Diagnosis present

## 2021-07-30 DIAGNOSIS — R222 Localized swelling, mass and lump, trunk: Secondary | ICD-10-CM | POA: Diagnosis not present

## 2021-07-30 DIAGNOSIS — Z811 Family history of alcohol abuse and dependence: Secondary | ICD-10-CM | POA: Diagnosis not present

## 2021-07-30 DIAGNOSIS — Z8249 Family history of ischemic heart disease and other diseases of the circulatory system: Secondary | ICD-10-CM | POA: Diagnosis not present

## 2021-07-30 DIAGNOSIS — Z66 Do not resuscitate: Secondary | ICD-10-CM | POA: Diagnosis present

## 2021-07-30 DIAGNOSIS — J309 Allergic rhinitis, unspecified: Secondary | ICD-10-CM | POA: Diagnosis present

## 2021-07-30 DIAGNOSIS — I255 Ischemic cardiomyopathy: Secondary | ICD-10-CM | POA: Diagnosis present

## 2021-07-30 DIAGNOSIS — Z8673 Personal history of transient ischemic attack (TIA), and cerebral infarction without residual deficits: Secondary | ICD-10-CM | POA: Diagnosis not present

## 2021-07-30 MED ORDER — DOCUSATE SODIUM 100 MG PO CAPS
100.0000 mg | ORAL_CAPSULE | Freq: Every day | ORAL | Status: DC
  Administered 2021-07-30 – 2021-08-01 (×3): 100 mg via ORAL
  Filled 2021-07-30 (×3): qty 1

## 2021-07-30 MED ORDER — POLYETHYLENE GLYCOL 3350 17 G PO PACK: 17.0000 g | PACK | Freq: Every day | ORAL | Status: DC | PRN

## 2021-07-30 NOTE — Progress Notes (Signed)
   07/30/21 0415  Assess: MEWS Score  Temp 98.2 F (36.8 C)  BP 98/62  Pulse Rate 70  ECG Heart Rate 70  Resp 13  SpO2 96 %  O2 Device Room Air  Assess: MEWS Score  MEWS Temp 0  MEWS Systolic 1  MEWS Pulse 0  MEWS RR 1  MEWS LOC 0  MEWS Score 2  MEWS Score Color Yellow  Assess: if the MEWS score is Yellow or Red  Were vital signs taken at a resting state? Yes  Focused Assessment Change from prior assessment (see assessment flowsheet)  Early Detection of Sepsis Score *See Row Information* Low  MEWS guidelines implemented *See Row Information* No, vital signs rechecked  Notify: Charge Nurse/RN  Name of Charge Nurse/RN Notified Shiwangi, RN  Date Charge Nurse/RN Notified 07/30/21  Time Charge Nurse/RN Notified 0500

## 2021-07-30 NOTE — Progress Notes (Signed)
Mobility Specialist Progress Note    07/30/21 1542  Mobility  Activity Ambulated in hall  Level of Assistance Independent  Assistive Device None  Distance Ambulated (ft) 1200 ft  Mobility Ambulated independently in hallway  Mobility Response Tolerated well  Mobility performed by Mobility specialist  $Mobility charge 1 Mobility   Pt received in bed and agreeable. No complaints on walk. Returned to bed with call bell in reach.   [facility] Mobility Specialist  M.S. Primary Phone: [phone] M.S. Secondary Phone: [phone]

## 2021-07-30 NOTE — Plan of Care (Signed)

## 2021-07-30 NOTE — H&P (Signed)
[address]       [place] [address]             [phone]                    [name] [facility] Medical Record #[id number] Date of Birth: Oct 27, 1955  Referring: No ref. provider found Primary Care: [name], MD Primary Cardiologist: Larae Grooms, MD  Chief Complaint:   No chief complaint on file.   History of Present Illness:    [name] 65 y.o. male presents today for endobronchial valve removal and biopsy of a left chest wall nodule.  He previously underwent a robotic assisted lobectomy for neuroendocrine tumor which was complicated by prolonged air leak.  Over the past several weeks he has developed new onset chest wall pain.  PET/CT was performed showed avidity in his chest wall which was concerning for metastasis.    Past Medical History:  Diagnosis Date   Anemia    Anxiety    Arthritis    Basal cell carcinoma (BCC) of forehead    CAD (coronary artery disease)    a. 10/2015 ant STEMI >> LHC with 3 v CAD; oLAD tx with POBA >> emergent CABG. b. Multiple evals since that time, early graft failure of SVG-RCA by cath 03/2016. c. 2/19 PCI/DES x1 to pRCA, normal EF.   Carotid artery disease ([place])    a. 40-59% BICA 02/2018.   Depression    Dyspnea    Ectopic atrial tachycardia (HCC)    Esophageal reflux    eosinophil esophagitis   Family history of adverse reaction to anesthesia    "sister has PONV" (06/21/2017)   Former tobacco use    Gout    Hepatitis C    "treated and cured" (06/21/2017)   High cholesterol    History of blood transfusion    History of kidney stones    Hypertension    Ischemic cardiomyopathy    a. EF 25-30% at intraop TEE 4/17  //  b. Limited Echo 5/17 - EF 45-50%, mild ant HK. c. EF 55-65% by cath 09/2017.   Migraine    "3-4/yr" (06/21/2017)   Myocardial infarction ([place]) 10/2015   Palpitations    Pneumonia    Sinus bradycardia    a. HR dropping into 40s in 02/2016 -> BB reduced.   Stroke [facility])  10/2016   "small one; sometimes my memory/cognitive issues" (06/21/2017)   Symptomatic hypotension    a. 02/2016 ER visit -> meds reduced.   Syncope    Wears dentures    Wears glasses     Past Surgical History:  Procedure Laterality Date   25 GAUGE PARS PLANA VITRECTOMY WITH 20 GAUGE MVR PORT  12/31/2020   Procedure: 25 GAUGE PARS PLANA VITRECTOMY WITH 20 GAUGE MVR PORT;  Surgeon: [name], MD;  Location: [place];  Service: Thoracic;;   ANTERIOR CERVICAL DECOMP/DISCECTOMY FUSION N/A 10/17/2018   Procedure: Anterior Cervical Decompression Fusion - Cervical seven -Thoracic one;  Surgeon: Consuella Lose, MD;  Location: [place];  Service: Neurosurgery;  Laterality: N/A;   BASAL CELL CARCINOMA EXCISION     "forehead   BIOPSY  07/20/2019   Procedure: BIOPSY;  Surgeon: Carol Ada, MD;  [facility]: [facility];  Service: Endoscopy;;   BRONCHIAL BIOPSY  12/24/2020   Procedure: BRONCHIAL BIOPSIES;  Surgeon: Garner Nash, DO;  Location: [place];  Service: Pulmonary;;   BRONCHIAL BRUSHINGS  12/24/2020  Procedure: BRONCHIAL BRUSHINGS;  Surgeon: Garner Nash, DO;  Location: [place] [facility];  Service: Pulmonary;;   BRONCHIAL NEEDLE ASPIRATION BIOPSY  12/24/2020   Procedure: BRONCHIAL NEEDLE ASPIRATION BIOPSIES;  Surgeon: Garner Nash, DO;  Location: [place];  Service: Pulmonary;;   CARDIAC CATHETERIZATION N/A 11/28/2015   Procedure: Left Heart Cath and Coronary Angiography;  Surgeon: [name], MD;  Location: [place] [facility];  Service: Cardiovascular;  Laterality: N/A;   CARDIAC CATHETERIZATION N/A 11/28/2015   Procedure: Coronary Balloon Angioplasty;  Surgeon: [name], MD;  Location: [place] [facility];  Service: Cardiovascular;  Laterality: N/A;  ostial LAD   CARDIAC CATHETERIZATION N/A 11/28/2015   Procedure: Coronary/Graft Angiography;  Surgeon: [name], MD;  Location: [place] [facility];  Service: Cardiovascular;  Laterality: N/A;   coronaries only    CARDIAC CATHETERIZATION N/A 04/21/2016   Procedure: Left Heart Cath and Coronary Angiography;  Surgeon: Wellington Hampshire, MD;  Location: [place] [facility];  Service: Cardiovascular;  Laterality: N/A;   CARDIAC CATHETERIZATION N/A 06/14/2016   Procedure: Left Heart Cath and Cors/Grafts Angiography;  Surgeon: Lorretta Harp, MD;  Location: [place] [place] [facility];  Service: Cardiovascular;  Laterality: N/A;   CARDIAC CATHETERIZATION N/A 09/08/2016   Procedure: Left Heart Cath and Cors/Grafts Angiography;  Surgeon: Wellington Hampshire, MD;  Location: [place] [facility];  Service: Cardiovascular;  Laterality: N/A;   CARDIAC CATHETERIZATION     CORONARY ARTERY BYPASS GRAFT N/A 11/28/2015   Procedure: CORONARY ARTERY BYPASS GRAFTING (CABG) TIMES FIVE USING LEFT INTERNAL MAMMARY ARTERY AND RIGHT GREATER SAPHENOUS,VIEN HARVEATED BY ENDOVIEN, INTRAOPPRATIVE TEE;  Surgeon: Gaye Pollack, MD;  Location: [place];  Service: Open Heart Surgery;  Laterality: N/A;   CORONARY STENT INTERVENTION N/A 10/05/2017   Procedure: CORONARY STENT INTERVENTION;  Surgeon: [name], MD;  Location: [place] [facility];  Service: Cardiovascular;  Laterality: N/A;   ESOPHAGOGASTRODUODENOSCOPY (EGD) WITH PROPOFOL N/A 07/20/2019   Procedure: ESOPHAGOGASTRODUODENOSCOPY (EGD) WITH PROPOFOL;  Surgeon: Carol Ada, MD;  [facility];  Service: Endoscopy;  Laterality: N/A;   ESOPHAGOGASTRODUODENOSCOPY (EGD) WITH PROPOFOL N/A 01/30/2021   Procedure: ESOPHAGOGASTRODUODENOSCOPY (EGD) WITH PROPOFOL;  Surgeon: Carol Ada, MD;  [facility];  Service: Endoscopy;  Laterality: N/A;   FIDUCIAL MARKER PLACEMENT  12/24/2020   Procedure: FIDUCIAL DYE MARKER PLACEMENT;  Surgeon: Garner Nash, DO;  Location: [place] [facility];  Service: Pulmonary;;   HUMERUS SURGERY Right 1969   "tumor inside bone; filled it w/bone chips"   INTERCOSTAL NERVE BLOCK Right 12/24/2020   Procedure: INTERCOSTAL NERVE BLOCK;   Surgeon: [name], MD;  Location: [place];  Service: Thoracic;  Laterality: Right;   IR IMAGING GUIDED PORT INSERTION  02/06/2021   LEFT HEART CATH AND CORS/GRAFTS ANGIOGRAPHY N/A 03/11/2017   Procedure: Left Heart Cath and Cors/Grafts Angiography;  Surgeon: Leonie Man, MD;  Location: [place] [facility];  Service: Cardiovascular;  Laterality: N/A;   LEFT HEART CATH AND CORS/GRAFTS ANGIOGRAPHY N/A 10/05/2017   Procedure: LEFT HEART CATH AND CORS/GRAFTS ANGIOGRAPHY;  Surgeon: [name], MD;  Location: [facility] [facility];  Service: Cardiovascular;  Laterality: N/A;   LEFT HEART CATH AND CORS/GRAFTS ANGIOGRAPHY N/A 04/11/2019   Procedure: LEFT HEART CATH AND CORS/GRAFTS ANGIOGRAPHY;  Surgeon: [name], MD;  Location: [place] [facility];  Service: Cardiovascular;  Laterality: N/A;   NODE DISSECTION Right 12/24/2020   Procedure: NODE DISSECTION;  Surgeon: [name], MD;  [facility];  Service: Thoracic;  Laterality: Right;   PERIPHERAL VASCULAR CATHETERIZATION N/A 06/14/2016   Procedure: Lower Extremity Angiography;  Surgeon: Lorretta Harp, MD;  Location: [place] [facility];  Service: Cardiovascular;  Laterality: N/A;   VIDEO BRONCHOSCOPY WITH ENDOBRONCHIAL NAVIGATION Right 12/24/2020   Procedure: VIDEO BRONCHOSCOPY WITH ENDOBRONCHIAL NAVIGATION;  Surgeon: Garner Nash, DO;  Location: [facility];  Service: Pulmonary;  Laterality: Right;   VIDEO BRONCHOSCOPY WITH ENDOBRONCHIAL ULTRASOUND N/A 12/24/2020   Procedure: VIDEO BRONCHOSCOPY WITH ENDOBRONCHIAL ULTRASOUND;  Surgeon: Garner Nash, DO;  Location: [facility];  Service: Pulmonary;  Laterality: N/A;   VIDEO BRONCHOSCOPY WITH INSERTION OF INTERBRONCHIAL VALVE (IBV) N/A 12/31/2020   Procedure: VIDEO BRONCHOSCOPY WITH INSERTION OF INTERBRONCHIAL VALVE (IBV).VALVE IN CARTRIDGE 74mm,9mm. CHEST TUBE PLACEMENT.;  Surgeon: [name], MD;  [facility];  Service: Thoracic;  Laterality: N/A;     Family History  Problem Relation Age of Onset   Lung cancer Mother    Heart Problems Father    Heart attack Father 39   Stroke Father    Heart failure Father    Heart attack Maternal Grandmother    Stroke Maternal Grandmother    Heart attack Paternal Uncle    Hypertension Brother    Autoimmune disease Neg Hx      Social History   Tobacco Use  Smoking Status Former   Packs/day: 0.75   Years: 44.00   Pack years: 33.00   Types: Cigarettes   Quit date: 11/28/2015   Years since quitting: 5.6  Smokeless Tobacco Never    Social History   Substance and Sexual Activity  Alcohol Use Not Currently   Comment: occ     Allergies  Allergen Reactions   Prednisone Other (See Comments)    States that this med makes him "crazy"   Tetanus Toxoids Swelling and Other (See Comments)    Fever, Swelling of the arm    Wellbutrin [Bupropion] Other (See Comments)    Crazy thoughts, nightmares   Indomethacin Other (See Comments)    rectal bleeding   Varenicline Other (See Comments)    Dreams     Current Facility-Administered Medications  Medication Dose Route Frequency Provider Last Rate Last Admin   aspirin EC tablet 81 mg  81 mg Oral Daily Gold, [name], PA-C   81 mg at 07/29/21 1848   atorvastatin (LIPITOR) tablet 80 mg  80 mg Oral Daily Gold, [name], PA-C       DULoxetine (CYMBALTA) DR capsule 30 mg  30 mg Oral QPM Gold, [name], PA-C   30 mg at 07/29/21 1848   ferrous gluconate (FERGON) tablet 324 mg  324 mg Oral Q breakfast Gold, [name], PA-C       HYDROmorphone (DILAUDID) injection 1 mg  1 mg Intravenous Q2H PRN [name], MD   1 mg at 07/30/21 0326   midodrine (PROAMATINE) tablet 2.5 mg  2.5 mg Oral TID PRN Jadene Pierini E, PA-C       nitroGLYCERIN (NITROSTAT) SL tablet 0.4 mg  0.4 mg Sublingual Q5 min PRN Gold, [name], PA-C       ondansetron (ZOFRAN) tablet 8 mg  8 mg Oral Q8H PRN Gold, [name], PA-C       oxyCODONE (Oxy IR/ROXICODONE) immediate release tablet  10 mg  10 mg Oral Q4H PRN Gold, [name], PA-C   10 mg at 07/29/21 2244   prochlorperazine (COMPAZINE) tablet 10 mg  10 mg Oral  Q6H PRN Jadene Pierini E, PA-C       ranolazine (RANEXA) 12 hr tablet 1,000 mg  1,000 mg Oral BID Gold, [name], PA-C   1,000 mg at 07/29/21 2102   rivaroxaban (XARELTO) tablet 20 mg  20 mg Oral QPM Gold, [name], PA-C        Review of Systems  Constitutional:  Positive for malaise/fatigue.  Respiratory:  Negative for shortness of breath.   Musculoskeletal:  Positive for back pain and myalgias.  Neurological: Negative.    PHYSICAL EXAMINATION: BP 102/61   Pulse 73   Temp 97.7 F (36.5 C) (Oral)   Resp 16   Ht 5\' 9"  (1.753 m)   Wt 73.9 kg   SpO2 96%   BMI 24.07 kg/m   Physical Exam Constitutional:      Appearance: Normal appearance.  HENT:     Head: Normocephalic and atraumatic.  Cardiovascular:     Rate and Rhythm: Normal rate.  Pulmonary:     Effort: Pulmonary effort is normal. No respiratory distress.  Abdominal:     General: There is no distension.  Musculoskeletal:        General: Normal range of motion.     Cervical back: Normal range of motion.  Neurological:     General: No focal deficit present.     Mental Status: He is alert and oriented to person, place, and time.     Diagnostic Studies & Laboratory data:     Recent Radiology Findings:   DG Chest 2 View  Result Date: 07/29/2021 CLINICAL DATA:  Preop for bronchoscopy with valve removal later today. Ex-smoker. Cardiomyopathy. EXAM: CHEST - 2 VIEW COMPARISON:  PET of 10 scratch a PET 07/03/2021. Chest radiograph 06/26/2021 FINDINGS: Cervical spine fixation.  Median sternotomy for CABG. Midline trachea. Right Port-A-Cath tip at superior caval/atrial junction. Right-sided endobronchial valves again identified. Mild right hemidiaphragm elevation with mild right pleural thickening. No pneumothorax. Volume loss at the right lung base. No lobar consolidation. IMPRESSION: No acute process or  interval change compared to 06/26/2021. Electronically Signed   By: Abigail Miyamoto M.D.   On: 07/29/2021 13:09   NM PET Image Restag (PS) Skull Base To Thigh  Result Date: 07/06/2021 CLINICAL DATA:  Subsequent treatment strategy for non-small cell lung cancer (large neuroendocrine cancer). Previous right upper lobectomy and chemotherapy. Increasing chest wall pain. EXAM: NUCLEAR MEDICINE PET SKULL BASE TO THIGH TECHNIQUE: 8.2 mCi F-18 FDG was injected intravenously. Full-ring PET imaging was performed from the skull base to thigh after the radiotracer. CT data was obtained and used for attenuation correction and anatomic localization. Fasting blood glucose: 125 mg/dl COMPARISON:  CT chest 06/26/2021 and 06/04/2021, abdominopelvic CT 02/14/2021 and PET-CT 11/27/2020 FINDINGS: Mediastinal blood pool activity: SUV max 1.6 NECK: No hypermetabolic cervical lymph nodes are identified.There are no lesions of the pharyngeal mucosal space. Incidental CT findings: Bilateral carotid atherosclerosis. CHEST: There are no hypermetabolic mediastinal, hilar or axillary lymph nodes. There is mildly prominent activity in the right hilum (SUV max 3.3), without corresponding enlarged lymph node. There are new patchy tree-in-bud airspace opacities within the superior segment of the right lower lobe with associated low level hypermetabolic activity (SUV max 2.5), most consistent with bronchopneumonia. Additional components are present more inferiorly in the right lower lobe (image 57/8), not present on chest CT from last week, and also likely inflammatory. 6 mm left upper lobe nodule on image 25/8 does not demonstrate significant hypermetabolic activity (SUV max 1.0), although this nodule has  slowly enlarged from CTs dating back to 02/14/2021. There is patchy hypermetabolic activity within the soft tissues of the right chest wall posteriorly, most notably within the 9th intercostal space where there is some extrapleural nodularity with  an SUV max of 4.6. Stable small right pleural effusion without significant hypermetabolic activity. Incidental CT findings: Right IJ Port-A-Cath extends to the superior cavoatrial junction. There is atherosclerosis of the aorta, great vessels and coronary arteries status post median sternotomy and CABG. ABDOMEN/PELVIS: There is no hypermetabolic activity within the liver, adrenal glands, spleen or pancreas. There is no hypermetabolic nodal activity. Incidental CT findings: Left renal cyst and aortic and branch vessel atherosclerosis are noted. SKELETON: There is no hypermetabolic activity to suggest osseous metastatic disease. There is patchy muscular activity which is within physiologic limits. As above, there are asymmetric components within the soft tissues of the right posterior chest wall at the site of the thoracotomy. This includes extrapleural nodular components in the posterior right intercostal space which are mildly hypermetabolic (SUV max 4.6). Incidental CT findings: none IMPRESSION: 1. Asymmetric nodular hypermetabolic activity within the posterior right chest wall near the thoracotomy site. This finding is nonspecific, and grossly stable from recent postoperative studies. Early chest wall recurrence cannot be excluded given the patient's pain. Attention on CT follow-up recommended. No bone destruction identified. 2. The enlarging 6 mm nodule in the left upper lobe does not demonstrate hypermetabolic activity, although is too small to optimally evaluate by PET-CT. Recommend chest CT follow-in 3-6 months. 3. New patchy tree in bud airspace opacities with right lower lobe with associated hypermetabolic activity (new from chest CT of 06/26/2021), consistent with bronchopneumonia. 4. No other evidence of metastatic disease. 5. These results will be called to the ordering clinician or representative by the Radiologist Assistant, and communication documented in the PACS or [employer]. Electronically  Signed   By: Richardean Sale M.D.   On: 07/06/2021 09:05   LONG TERM MONITOR (3-14 DAYS)  Result Date: 07/14/2021  Normal sinus rhythm and sinus tachycardia with rare PACs and PVCs.  No AFib.  Symptoms were associated with sinus tachycardia.  Patch Wear Time:  13 days and 22 hours (2022-10-17T10:38:10-398 to 2022-10-31T09:00:44-0400) Patient had a min HR of 46 bpm, max HR of 167 bpm, and avg HR of 79 bpm. Predominant underlying rhythm was Sinus Rhythm. Isolated SVEs were rare (<1.0%), and no SVE Couplets or SVE Triplets were present. Isolated VEs were rare (<1.0%), VE Couplets were rare (<1.0%), and no VE Triplets were present.       I have independently reviewed the above radiology studies  and reviewed the findings with the patient.   Recent Lab Findings: Lab Results  Component Value Date   WBC 7.7 07/29/2021   HGB 13.8 07/29/2021   HCT 41.9 07/29/2021   PLT 233 07/29/2021   GLUCOSE 119 (H) 07/29/2021   CHOL 82 12/27/2020   TRIG 97 12/27/2020   HDL 35 (L) 12/27/2020   LDLCALC 28 12/27/2020   ALT 26 07/29/2021   AST 26 07/29/2021   NA 137 07/29/2021   K 4.0 07/29/2021   CL 102 07/29/2021   CREATININE 1.30 (H) 07/29/2021   BUN 19 07/29/2021   CO2 24 07/29/2021   TSH 0.847 12/26/2020   INR 1.0 07/29/2021   HGBA1C 5.0 11/10/2016        Assessment / Plan:   65 year old male admitted following a bronchoscopy and endobronchial valve removal, and chest wall excisional biopsy.  He is admitted for pain  control.      [name] 07/30/2021 7:05 AM

## 2021-07-30 NOTE — Care Management Obs Status (Signed)
[facility] NOTIFICATION   Patient Details  Name: OLLIVANDER SEE MRN: [id number] Date of Birth: [birth year]   Medicare Observation Status Notification Given:  Yes  Permission given to sign for patient as notice was verbal over phone.Copy placed in [facility] instructions  Verdell Carmine, RN 07/30/2021, 9:24 AM

## 2021-07-30 NOTE — Progress Notes (Addendum)
      [address]       [place] [address]             [phone]      1 Day Post-Op Procedure(s) (LRB): VIDEO BRONCHOSCOPY WITH REMOVAL OF INTERBRONCHIAL VALVE (IBV) (N/A) RIGHT CHEST WALL MASS BIOPSY (Right) Subjective: Primary c/o is poor control of chronic pain  Objective: Vital signs in last 24 hours: Temp:  [97.7 F (36.5 C)-98.2 F (36.8 C)] 97.7 F (36.5 C) (12/01 0509) Pulse Rate:  [70-107] 73 (12/01 0509) Cardiac Rhythm: Normal sinus rhythm (11/30 1900) Resp:  [11-18] 16 (12/01 0509) BP: (98-131)/(61-83) 102/61 (12/01 0509) SpO2:  [90 %-100 %] 96 % (12/01 0509) Weight:  [73.9 kg] 73.9 kg (11/30 1313)  Hemodynamic parameters for last 24 hours:    Intake/Output from previous day: 11/30 0701 - 12/01 0700 In: 2350 [I.V.:2100; IV Piggyback:250] Out: 755 [Urine:750; Blood:5] Intake/Output this shift: No intake/output data recorded.  General appearance: alert, cooperative, and no distress Heart: regular rate and rhythm Lungs: clear to auscultation bilaterally Abdomen: benign  Lab Results: Recent Labs    07/29/21 1318  WBC 7.7  HGB 13.8  HCT 41.9  PLT 233   BMET:  Recent Labs    07/29/21 1318  NA 137  K 4.0  CL 102  CO2 24  GLUCOSE 119*  BUN 19  CREATININE 1.30*  CALCIUM 10.2    PT/INR:  Recent Labs    07/29/21 1318  LABPROT 12.9  INR 1.0   ABG    Component Value Date/Time   PHART 7.403 12/22/2020 1408   HCO3 21.3 12/22/2020 1408   TCO2 29 06/23/2018 1038   ACIDBASEDEF 2.7 (H) 12/22/2020 1408   O2SAT 97.0 12/22/2020 1408   CBG (last 3)  No results for input(s): GLUCAP in the last 72 hours.  Meds Scheduled Meds:  aspirin EC  81 mg Oral Daily   atorvastatin  80 mg Oral Daily   DULoxetine  30 mg Oral QPM   ferrous gluconate  324 mg Oral Q breakfast   ranolazine  1,000 mg Oral BID   rivaroxaban  20 mg Oral QPM   Continuous Infusions: PRN Meds:.HYDROmorphone (DILAUDID) injection, midodrine, nitroGLYCERIN,  ondansetron, oxyCODONE, prochlorperazine  Xrays DG Chest 2 View  Result Date: 07/29/2021 CLINICAL DATA:  Preop for bronchoscopy with valve removal later today. Ex-smoker. Cardiomyopathy. EXAM: CHEST - 2 VIEW COMPARISON:  PET of 10 scratch a PET 07/03/2021. Chest radiograph 06/26/2021 FINDINGS: Cervical spine fixation.  Median sternotomy for CABG. Midline trachea. Right Port-A-Cath tip at superior caval/atrial junction. Right-sided endobronchial valves again identified. Mild right hemidiaphragm elevation with mild right pleural thickening. No pneumothorax. Volume loss at the right lung base. No lobar consolidation. IMPRESSION: No acute process or interval change compared to 06/26/2021. Electronically Signed   By: Abigail Miyamoto M.D.   On: 07/29/2021 13:09    Assessment/Plan: S/P Procedure(s) (LRB): VIDEO BRONCHOSCOPY WITH REMOVAL OF INTERBRONCHIAL VALVE (IBV) (N/A) RIGHT CHEST WALL MASS BIOPSY (Right)  1 afeb, VSS 2 sats good on RA 3 good UOP 4 no new labs 5 will d/w MD- has appt for main clinic but not till January  Addendum- we are placing a palliative care consult to assist with pain issues and potectially facilitate f/u at cancer center pain management clinic NP  LOS: 0 days    John Giovanni PA-C Pager [phone] 07/30/2021

## 2021-07-30 NOTE — Progress Notes (Signed)
Vital signs rechecked, will continue to monitor.  07/30/21 0415 07/30/21 0509  Assess: MEWS Score  Temp 98.2 F (36.8 C) 97.7 F (36.5 C)  BP 98/62 102/61  Pulse Rate 70 73  ECG Heart Rate 70 72  Resp 13 16  Level of Consciousness  --  Alert  SpO2  --  96 %  O2 Device  --  Room Air  Assess: MEWS Score  MEWS Temp 0 0  MEWS Systolic 1 0  MEWS Pulse 0 0  MEWS RR 1 0  MEWS LOC 0 0  MEWS Score 2 0  MEWS Score Color Yellow Green

## 2021-07-30 NOTE — Hospital Course (Addendum)
HPI:  [name] is a 65 year old gentleman who had previously undergone a robotic assisted right upper lobectomy for a neuroendocrine tumor.  This hospitalization was complicated by prolonged air leak which required placement of endobronchial valves.  He has completed his course of adjuvant therapy but has developed new onset chest wall pain close to the previous incisions and had a palpable nodule underneath the assistant port.  He comes to the operating room today for removal of the endobronchial valves, and excision of this chest wall nodule.  Hospital course:  The patient was brought to the hospital electively and on 07/29/2021 taken the OR where he underwent removal of a 9 mm endobronchial valve excision of a chest wall mass by Dr. Kipp Brood.  Postoperative hospital course:   Patient continues to have issues with pain.  We have asked palliative care to assist with recommendations for further control as it is extremely lifestyle limiting to the patient.  He is on chronic opiates since surgery and relief is very limited at this time on current regimen.  He is otherwise remained quite stable. Pathology is pending.

## 2021-07-31 DIAGNOSIS — C3411 Malignant neoplasm of upper lobe, right bronchus or lung: Secondary | ICD-10-CM

## 2021-07-31 DIAGNOSIS — Z515 Encounter for palliative care: Secondary | ICD-10-CM

## 2021-07-31 DIAGNOSIS — R222 Localized swelling, mass and lump, trunk: Secondary | ICD-10-CM

## 2021-07-31 MED ORDER — HYDROMORPHONE HCL 2 MG PO TABS
4.0000 mg | ORAL_TABLET | ORAL | Status: DC | PRN
  Administered 2021-07-31 – 2021-08-01 (×5): 4 mg via ORAL
  Filled 2021-07-31 (×5): qty 2

## 2021-07-31 MED ORDER — GABAPENTIN 100 MG PO CAPS
100.0000 mg | ORAL_CAPSULE | Freq: Every day | ORAL | Status: DC
  Administered 2021-07-31: 100 mg via ORAL
  Filled 2021-07-31: qty 1

## 2021-07-31 MED ORDER — BISACODYL 5 MG PO TBEC
5.0000 mg | DELAYED_RELEASE_TABLET | Freq: Every day | ORAL | Status: DC | PRN
  Administered 2021-07-31: 5 mg via ORAL
  Filled 2021-07-31: qty 1

## 2021-07-31 MED ORDER — OXYCODONE HCL ER 10 MG PO T12A
10.0000 mg | EXTENDED_RELEASE_TABLET | Freq: Two times a day (BID) | ORAL | Status: DC
  Administered 2021-07-31 – 2021-08-01 (×3): 10 mg via ORAL
  Filled 2021-07-31 (×3): qty 1

## 2021-07-31 MED ORDER — SENNA 8.6 MG PO TABS
2.0000 | ORAL_TABLET | Freq: Every day | ORAL | Status: DC
  Administered 2021-07-31: 17.2 mg via ORAL
  Filled 2021-07-31: qty 2

## 2021-07-31 MED ORDER — DULOXETINE HCL 20 MG PO CPEP
40.0000 mg | ORAL_CAPSULE | Freq: Every evening | ORAL | Status: DC
  Administered 2021-07-31: 40 mg via ORAL
  Filled 2021-07-31 (×2): qty 2

## 2021-07-31 MED ORDER — LIDOCAINE 5 % EX PTCH
2.0000 | MEDICATED_PATCH | CUTANEOUS | Status: DC
  Administered 2021-07-31 – 2021-08-01 (×2): 2 via TRANSDERMAL
  Filled 2021-07-31 (×2): qty 2

## 2021-07-31 MED ORDER — ACETAMINOPHEN 325 MG PO TABS
650.0000 mg | ORAL_TABLET | Freq: Three times a day (TID) | ORAL | Status: DC
  Administered 2021-07-31 – 2021-08-01 (×4): 650 mg via ORAL
  Filled 2021-07-31 (×4): qty 2

## 2021-07-31 NOTE — Progress Notes (Addendum)
Consultation Note Date: 07/31/2021   Patient Name: [name]  DOB: 05-30-1956  MRN: [id number]  Age / Sex: 65 y.o., male  PCP: [name], MD Referring Physician: [name], MD  Reason for Consultation: symptom management  HPI/Patient Profile: 65 y.o. 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 Palliative consulted for pain management.    Primary Decision Maker PATIENT  Discussion: Met with patient at bedside. He reports pain in his R lower back where tumor was biopsied and well as pain in R chest where there is another palpable tumor. Pain radiates around the side of his chest to the front and sometimes down to his hip. The pain has interfered with his ability to function- limiting his ability to work, impaired his ability to enjoy spending time with his family, and has decreased his appetite, it also wakes him from sleep. The pain has worsened over time, he was taking oxycodone 23m with some relief, but pain would reoccur before next dose was due and he was taking it more than prescribed because of this.  Pain currently is better managed with IV hydromorphone.  He reports some mood changes related to chronic pain and illness. He notes his temper tends to flare more easily.  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  He has utilized approximately 1788moral  morphine equivalents in the last 24 hours.   SUMMARY OF RECOMMENDATIONS -For now he will benefit from long acting pain medication with breakthrough dosing until tumors are either excised or treatment is begun -Recommend multimodal interventions as follows:  -Acetaminophen 650 TID -Increase cymbalta to 4077mHS -Start gabapentin 100m90mS-  he reports trying this in the past and it made him feel a little woozy, but he is willing to try it again- taking it at night may also help, if it makes him feel this way again, he will stop it -Oxycontin 10mg26m -Hydromorphone 4mg q56m prn for breakthrough pain -Above doses are approx 130mg d75m oral morphine equivalent (dose reduced for opioid rotation)  -Heat therapy -Daily meditation- patient reports he utilizes prayer -Will make referral to outpatient Palliative provider at cancer center for followup -We discussed the necessity of honest communication with providers regarding his medication use -Has history of constipation- he prefers dulcolax- I have ordered this prn and also added Senna 2 po QHS   -He inquired about discharge- will defer this to primary team- may benefit from one more night to ensure that pain will be controlled  Code Status/Advance  Care Planning: DNR   Prognosis:   Unable to determine  Discharge Planning: Home with Palliative Services  Primary Diagnoses: Present on Admission:  Lung cancer ([place])   Review of Systems  Constitutional:  Positive for activity change and appetite change.   Physical Exam Vitals and nursing note reviewed.  Constitutional:      Appearance: Normal appearance.  Skin:    General: Skin is warm and dry.     Comments: Palpable nodule approx 3cm on R lower chest wall, healing incision R lower back  Neurological:     General: No focal deficit present.  Psychiatric:        Mood and Affect: Mood normal.        Behavior: Behavior normal.        Thought Content: Thought content normal.         Judgment: Judgment normal.    Vital Signs: BP 124/72 (BP Location: Left Arm)   Pulse 80   Temp 98.1 F (36.7 C)   Resp 13   Ht 5' 9"  (1.753 m)   Wt 73.9 kg   SpO2 99%   BMI 24.07 kg/m  Pain Scale: 0-10 POSS *See Group Information*: S-Acceptable,Sleep, easy to arouse Pain Score: 6    SpO2: SpO2: 99 % O2 Device:SpO2: 99 % O2 Flow Rate: .   IO: Intake/output summary:  Intake/Output Summary (Last 24 hours) at 07/31/2021 1131 Last data filed at 07/31/2021 5868 Gross per 24 hour  Intake 600 ml  Output 1800 ml  Net -1200 ml    LBM:   Baseline Weight: Weight: 73.9 kg Most recent weight: Weight: 73.9 kg       Thank you for this consult. Palliative medicine will continue to follow and assist as needed.   Time In:  0945 Time Out: 1055 Time Total: 70 mins Greater than 50%  of this time was spent counseling and coordinating care related to the above assessment and plan.  Signed by: Mariana Kaufman, AGNP-C Palliative Medicine    Please contact Palliative Medicine Team phone at [phone] for questions and concerns.  For individual provider: See Shea Evans

## 2021-07-31 NOTE — Progress Notes (Signed)
Mobility Specialist Progress Note:   07/31/21 1122  Mobility  Activity Ambulated in hall  Level of Assistance Modified independent, requires aide device or extra time  Assistive Device None  Distance Ambulated (ft) 850 ft  Mobility Ambulated with assistance in hallway  Mobility Response Tolerated well  Mobility performed by Mobility specialist  $Mobility charge 1 Mobility   BP sitting: 93/77 BP standing: 83/62 BP right after ambulation: 148/78  Pt received willing to participate in mobility. Pt stated he felt dizzy but still wanted to ambulate. Complaints of 7/10 pain "where the cancer was". Pt returned to bed with call bell in reach and all needs met.   [facility] Public librarian Phone [phone] Secondary Phone [phone]

## 2021-07-31 NOTE — Progress Notes (Addendum)
[address]       [place] [address]             [phone]      2 Days Post-Op Procedure(s) (LRB): VIDEO BRONCHOSCOPY WITH REMOVAL OF INTERBRONCHIAL VALVE (IBV) (N/A) RIGHT CHEST WALL MASS BIOPSY (Right) Subjective: Has not been seen yet by palliative medicine- spoke with them and they will see today. Got some sleep . Pain control pretty good currently  Objective: Vital signs in last 24 hours: Temp:  [97.6 F (36.4 C)-97.9 F (36.6 C)] 97.7 F (36.5 C) (12/02 0338) Pulse Rate:  [72-88] 80 (12/02 0338) Cardiac Rhythm: Normal sinus rhythm (12/01 0747) Resp:  [14-24] 14 (12/02 0338) BP: (104-125)/(63-77) 125/77 (12/02 0338) SpO2:  [96 %-100 %] 99 % (12/02 0338)  Hemodynamic parameters for last 24 hours:    Intake/Output from previous day: 12/01 0701 - 12/02 0700 In: 1080 [P.O.:1080] Out: 2200 [Urine:2200] Intake/Output this shift: Total I/O In: 120 [P.O.:120] Out: 1400 [Urine:1400]  General appearance: alert, cooperative, and no distress Heart: regular rate and rhythm Lungs: clear to auscultation bilaterally Abdomen: benign  Lab Results: Recent Labs    07/29/21 1318  WBC 7.7  HGB 13.8  HCT 41.9  PLT 233   BMET:  Recent Labs    07/29/21 1318  NA 137  K 4.0  CL 102  CO2 24  GLUCOSE 119*  BUN 19  CREATININE 1.30*  CALCIUM 10.2    PT/INR:  Recent Labs    07/29/21 1318  LABPROT 12.9  INR 1.0   ABG    Component Value Date/Time   PHART 7.403 12/22/2020 1408   HCO3 21.3 12/22/2020 1408   TCO2 29 06/23/2018 1038   ACIDBASEDEF 2.7 (H) 12/22/2020 1408   O2SAT 97.0 12/22/2020 1408   CBG (last 3)  No results for input(s): GLUCAP in the last 72 hours.  Meds Scheduled Meds:  aspirin EC  81 mg Oral Daily   atorvastatin  80 mg Oral Daily   docusate sodium  100 mg Oral Daily   DULoxetine  30 mg Oral QPM   ferrous gluconate  324 mg Oral Q breakfast   ranolazine  1,000 mg Oral BID   rivaroxaban  20 mg Oral QPM    Continuous Infusions: PRN Meds:.HYDROmorphone (DILAUDID) injection, midodrine, nitroGLYCERIN, ondansetron, oxyCODONE, polyethylene glycol, prochlorperazine  Xrays DG Chest 2 View  Result Date: 07/29/2021 CLINICAL DATA:  Preop for bronchoscopy with valve removal later today. Ex-smoker. Cardiomyopathy. EXAM: CHEST - 2 VIEW COMPARISON:  PET of 10 scratch a PET 07/03/2021. Chest radiograph 06/26/2021 FINDINGS: Cervical spine fixation.  Median sternotomy for CABG. Midline trachea. Right Port-A-Cath tip at superior caval/atrial junction. Right-sided endobronchial valves again identified. Mild right hemidiaphragm elevation with mild right pleural thickening. No pneumothorax. Volume loss at the right lung base. No lobar consolidation. IMPRESSION: No acute process or interval change compared to 06/26/2021. Electronically Signed   By: Abigail Miyamoto M.D.   On: 07/29/2021 13:09    Assessment/Plan: S/P Procedure(s) (LRB): VIDEO BRONCHOSCOPY WITH REMOVAL OF INTERBRONCHIAL VALVE (IBV) (N/A) RIGHT CHEST WALL MASS BIOPSY (Right)   1 afeb, VSS. One episode of sinus tachy into 140's 2 sats good on RA 3 good uop 4 he is on xerelto so won't add lovenox for DVT ppx  5 on prn oxy and dilaudid- will see what palliative has to say  re: chronic cancer pain issues 6 hopefully home later today 7 final path pending    LOS: 1  day    John Giovanni PA-C Pager [phone] 07/31/2021    Agree with above. Appreciate palliative care recommendations. Dispo planning.  Latravis Grine Bary Leriche

## 2021-08-01 MED ORDER — OXYCODONE HCL ER 10 MG PO T12A: 10.0000 mg | EXTENDED_RELEASE_TABLET | Freq: Two times a day (BID) | ORAL | 0 refills | Status: DC

## 2021-08-01 MED ORDER — DULOXETINE HCL 40 MG PO CPEP: 40.0000 mg | ORAL_CAPSULE | Freq: Every evening | ORAL | 1 refills | Status: DC

## 2021-08-01 MED ORDER — ACETAMINOPHEN 325 MG PO TABS
650.0000 mg | ORAL_TABLET | Freq: Three times a day (TID) | ORAL | Status: DC
Start: 2021-08-01 — End: 2021-09-03

## 2021-08-01 MED ORDER — HYDROMORPHONE HCL 4 MG PO TABS: 4.0000 mg | ORAL_TABLET | ORAL | 0 refills | Status: DC | PRN

## 2021-08-01 MED ORDER — GABAPENTIN 100 MG PO CAPS: 100.0000 mg | ORAL_CAPSULE | Freq: Every day | ORAL | 1 refills | Status: DC

## 2021-08-01 NOTE — Plan of Care (Signed)

## 2021-08-01 NOTE — Plan of Care (Signed)

## 2021-08-01 NOTE — Progress Notes (Signed)
Pt got discharged to home, discharge instructions provided and patient showed understanding to it, IV taken out,Telemonitor DC,pt left unit in wheelchair with all of the belongings accompanied with a family member (wife)  Julyanna Scholle,RN 

## 2021-08-01 NOTE — TOC Initial Note (Signed)
Transition of Care [facility]) - Initial/Assessment Note    Patient Details  Name: [name] MRN: [id number] Date of Birth: [birth year]  [facility]) CM/SW Contact:    Zenon Mayo, RN Phone Number: 08/01/2021, 8:43 AM  Clinical Narrative:                 Patient is for dc today , he will have palliative services with oncology following up with him per the Palliative note, this is scheduled on AVS.  He states his wife will transport him home today. He has no other needs.  Expected Discharge Plan: Home/Self Care Barriers to Discharge: No Barriers Identified   Patient Goals and CMS Choice Patient states their goals for this hospitalization and ongoing recovery are:: return home      Expected Discharge Plan and Services Expected Discharge Plan: Home/Self Care In-house Referral: Hospice / Palliative Care Discharge Planning Services: CM Consult Post Acute Care Choice: NA Living arrangements for the past 2 months: Single Family Home Expected Discharge Date: 08/01/21                 DME Agency: NA       HH Arranged: NA          Prior Living Arrangements/Services Living arrangements for the past 2 months: Single Family Home Lives with:: Spouse   Do you feel safe going back to the place where you live?: Yes               Activities of Daily Living Home Assistive Devices/Equipment: Dentures (specify type), Blood pressure cuff, Scales ADL Screening (condition at time of admission) Patient's cognitive ability adequate to safely complete daily activities?: Yes Is the patient deaf or have difficulty hearing?: No Does the patient have difficulty seeing, even when wearing glasses/contacts?: No Does the patient have difficulty concentrating, remembering, or making decisions?: No Patient able to express need for assistance with ADLs?: Yes Does the patient have difficulty dressing or bathing?: No Independently performs ADLs?: Yes (appropriate for  developmental age) Does the patient have difficulty walking or climbing stairs?: No Weakness of Legs: None Weakness of Arms/Hands: None  Permission Sought/Granted                  Emotional Assessment   Attitude/Demeanor/Rapport: Engaged Affect (typically observed): Appropriate Orientation: : Oriented to Self, Oriented to Place, Oriented to  Time, Oriented to Situation Alcohol / Substance Use: Not Applicable Psych Involvement: No (comment)  Admission diagnosis:  Lung cancer ([place]) [C34.90] Patient Active Problem List   Diagnosis Date Noted   Lung cancer ([place]) 07/29/2021   Hemoptysis 06/06/2021   History of pulmonary embolus (PE) 04/29/2021   Anemia 04/29/2021   Hypotension due to hypovolemia 04/28/2021   Hypomagnesemia 04/08/2021   Port-A-Cath in place 04/07/2021   Protein-calorie malnutrition, severe 03/25/2021   PNA (pneumonia) 03/22/2021   Community acquired pneumonia 03/22/2021   Sepsis ([place]) 03/22/2021   CKD (chronic kidney disease), stage III ([place]) 03/22/2021   Hypoxia 02/28/2021   Dyspnea 02/28/2021   AKI (acute kidney injury) ([place]) 02/25/2021   AF (paroxysmal atrial fibrillation) ([place]) 02/25/2021   Pleural effusion 02/25/2021   Neutropenia, drug-induced ([place]) 02/24/2021   Positive blood cultures 02/17/2021   Positive blood culture 02/16/2021   Right flank pain 02/09/2021   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[place]) 01/30/2021   Encounter for antineoplastic chemotherapy 01/29/2021   Goals of care, counseling/discussion 01/14/2021   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[place]) 01/14/2021   Large  cell carcinoma of lung ([place]) 01/14/2021   Acute back pain with sciatica 01/12/2021   Allergic rhinitis 01/12/2021   Amnesia 01/12/2021   Kidney stone 01/12/2021   Sciatica 01/12/2021   Bipolar disorder ([place]) 01/12/2021   S/P lobectomy of lung 12/24/2020   Solitary lung nodule 11/28/2020   Chest pain 11/07/2019   Gastroesophageal reflux disease     Cervical radiculopathy 10/17/2018   Preoperative clearance 10/04/2018   Palpitations    Coronary artery disease involving coronary bypass graft of native heart with angina pectoris ([place])    Transient loss of consciousness 03/24/2018   Ectopic atrial tachycardia ([place]) 02/09/2018   Central chest pain 03/10/2017   Family hx-stroke 11/10/2016   Stroke-like episode ([place]) - R brain, s/p tPA 11/09/2016   Unstable angina ([place]) 09/07/2016   Claudication of both lower extremities ([place])    Pure hypercholesterolemia    Tobacco abuse disorder    CAD of autologous artery bypass graft without angina    Chest pain at rest 06/10/2016   Abnormal nuclear stress test - HIGH RISK 04/20/2016   Old MI (myocardial infarction)    Essential hypertension 02/26/2016   Ischemic cardiomyopathy 12/25/2015   Hyperlipidemia LDL goal <70 12/25/2015   Mild tobacco abuse in early remission 11/28/2015   Coronary artery disease involving native coronary artery of native heart with angina pectoris ([place]) 11/28/2015   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   Acute MI anterior wall first episode care [facility])    Precordial chest pain 03/07/2015   Gout attack 03/07/2015   Mixed bipolar I disorder ([place]) 03/07/2015   Fibromyalgia 07/09/2014   Gout 07/09/2014   Anxiety 07/09/2014   Depression 07/09/2014   Hepatitis C 73/41/9379   Eosinophilic esophagitis 02/40/9735   PCP:  [name], MD Pharmacy:   [id number] - [place], [place] [address] AT [facility] [address] [place] [address] Phone: [phone] Fax: [phone]     Social Determinants of Health (SDOH) Interventions    Readmission Risk Interventions Readmission Risk Prevention Plan 08/01/2021 03/25/2021  Transportation Screening Complete Complete  Medication Review Press photographer) Complete Complete  PCP or Specialist appointment within 3-5 days of discharge Complete Complete  HRI or Home Care Consult Complete  Complete  SW Recovery Care/Counseling Consult Complete Complete  Palliative Care Screening Complete Not [place] Not Applicable Not Applicable  Some recent data might be hidden

## 2021-08-01 NOTE — TOC Transition Note (Signed)
Transition of Care [facility]) - CM/SW Discharge Note   Patient Details  Name: AUSTIN HERD MRN: [id number] Date of Birth: 1956-02-14  Transition of Care [facility]) CM/SW Contact:  Zenon Mayo, RN Phone Number: 08/01/2021, 8:45 AM   Clinical Narrative:    Patient is for dc today , he will have palliative services with oncology following up with him per the Palliative note, this is scheduled on AVS.  He states his wife will transport him home today. He has no other needs.   Final next level of care: Home/Self Care Barriers to Discharge: No Barriers Identified   Patient Goals and CMS Choice Patient states their goals for this hospitalization and ongoing recovery are:: return home      Discharge Placement                       Discharge Plan and Services In-house Referral: Hospice / Palliative Care Discharge Planning Services: CM Consult Post Acute Care Choice: NA            DME Agency: NA       HH Arranged: NA          Social Determinants of Health (SDOH) Interventions     Readmission Risk Interventions Readmission Risk Prevention Plan 08/01/2021 03/25/2021  Transportation Screening Complete Complete  Medication Review Press photographer) Complete Complete  PCP or Specialist appointment within 3-5 days of discharge Complete Complete  HRI or [place] Complete Complete  SW Recovery Care/Counseling Consult Complete Complete  Palliative Care Screening Complete Not [place] Not Applicable Not Applicable  Some recent data might be hidden

## 2021-08-01 NOTE — Progress Notes (Signed)
      [address]       [place] [address]             [phone]      3 Days Post-Op Procedure(s) (LRB): VIDEO BRONCHOSCOPY WITH REMOVAL OF INTERBRONCHIAL VALVE (IBV) (N/A) RIGHT CHEST WALL MASS BIOPSY (Right) Subjective: Feels better, pain control improved  Objective: Vital signs in last 24 hours: Temp:  [97.6 F (36.4 C)-98.1 F (36.7 C)] 97.6 F (36.4 C) (12/03 0253) Pulse Rate:  [66-82] 66 (12/03 0253) Cardiac Rhythm: Normal sinus rhythm (12/02 1902) Resp:  [13-15] 15 (12/03 0253) BP: (111-137)/(69-80) 137/80 (12/03 0253) SpO2:  [98 %-100 %] 100 % (12/03 0253)  Hemodynamic parameters for last 24 hours:    Intake/Output from previous day: 12/02 0701 - 12/03 0700 In: 50 [P.O.:50] Out: 475 [Urine:475] Intake/Output this shift: No intake/output data recorded.  General appearance: alert, cooperative, and no distress Heart: regular rate and rhythm Lungs: clear to auscultation bilaterally  Lab Results: Recent Labs    07/29/21 1318  WBC 7.7  HGB 13.8  HCT 41.9  PLT 233   BMET:  Recent Labs    07/29/21 1318  NA 137  K 4.0  CL 102  CO2 24  GLUCOSE 119*  BUN 19  CREATININE 1.30*  CALCIUM 10.2    PT/INR:  Recent Labs    07/29/21 1318  LABPROT 12.9  INR 1.0   ABG    Component Value Date/Time   PHART 7.403 12/22/2020 1408   HCO3 21.3 12/22/2020 1408   TCO2 29 06/23/2018 1038   ACIDBASEDEF 2.7 (H) 12/22/2020 1408   O2SAT 97.0 12/22/2020 1408   CBG (last 3)  No results for input(s): GLUCAP in the last 72 hours.  Meds Scheduled Meds:  acetaminophen  650 mg Oral TID   aspirin EC  81 mg Oral Daily   atorvastatin  80 mg Oral Daily   docusate sodium  100 mg Oral Daily   DULoxetine  40 mg Oral QPM   ferrous gluconate  324 mg Oral Q breakfast   gabapentin  100 mg Oral QHS   lidocaine  2 patch Transdermal Q24H   oxyCODONE  10 mg Oral Q12H   ranolazine  1,000 mg Oral BID   rivaroxaban  20 mg Oral QPM   senna  2 tablet Oral  QHS   Continuous Infusions: PRN Meds:.bisacodyl, HYDROmorphone, midodrine, nitroGLYCERIN, ondansetron, polyethylene glycol, prochlorperazine  Xrays No results found.  Assessment/Plan: S/P Procedure(s) (LRB): VIDEO BRONCHOSCOPY WITH REMOVAL OF INTERBRONCHIAL VALVE (IBV) (N/A) RIGHT CHEST WALL MASS BIOPSY (Right)  1 afeb, VSS sinus rhythm 2 sats normal on RA 3 fill d/c on recs per palliative medicine RE: acute on chronic pain   LOS: 2 days    John Giovanni 08/01/2021

## 2021-08-03 ENCOUNTER — Telehealth: Payer: Self-pay

## 2021-08-03 ENCOUNTER — Other Ambulatory Visit: Payer: Self-pay | Admitting: Nurse Practitioner

## 2021-08-03 ENCOUNTER — Encounter (HOSPITAL_BASED_OUTPATIENT_CLINIC_OR_DEPARTMENT_OTHER): Payer: Self-pay

## 2021-08-03 ENCOUNTER — Encounter: Payer: Self-pay | Admitting: Physician Assistant

## 2021-08-03 ENCOUNTER — Other Ambulatory Visit: Payer: Medicare Other

## 2021-08-03 ENCOUNTER — Other Ambulatory Visit (HOSPITAL_BASED_OUTPATIENT_CLINIC_OR_DEPARTMENT_OTHER): Payer: Self-pay

## 2021-08-03 ENCOUNTER — Encounter: Payer: Self-pay | Admitting: Internal Medicine

## 2021-08-03 ENCOUNTER — Telehealth: Payer: Self-pay | Admitting: Internal Medicine

## 2021-08-03 LAB — SURGICAL PATHOLOGY

## 2021-08-03 MED ORDER — XTAMPZA ER 13.5 MG PO C12A: 13.5000 mg | EXTENDED_RELEASE_CAPSULE | Freq: Two times a day (BID) | ORAL | 0 refills | Status: DC

## 2021-08-03 MED ORDER — OXYCODONE HCL ER 10 MG PO T12A
10.0000 mg | EXTENDED_RELEASE_TABLET | Freq: Two times a day (BID) | ORAL | 0 refills | Status: DC
  Filled 2021-08-03: qty 60, 30d supply, fill #0

## 2021-08-03 NOTE — Telephone Encounter (Signed)
[name], NP attempted to complete prior authorization for Oxycontin but wasn't able to due to insurance wanting pt to receive Xtampza instead. Therefore, she sent this prescription to Mr. Kitner original [facility]. I relayed this information to Mr. Lepak and told him we would follow up with his pain management when he comes in on 12/7. I told him to call back with any questions/concerns. Understanding and gratitude verbalized. All questions answered.

## 2021-08-03 NOTE — Telephone Encounter (Signed)
Scheduled per sch msg. Called and spoke with patient. Confirmed appt  

## 2021-08-03 NOTE — Telephone Encounter (Signed)
[name] called this morning, reporting pain that was difficult to control since discharging from the hospital. He stated that his Oxycontin prescription was unable to be filled at his pharmacy. I called [name] at [facility] and they said they do have the medication in stock. I relayed this information to [place], NP, who sent in a new prescription. I informed [name] of where to pick up his medication. I also went over his new appointment with [name] scheduled after his visit with [name] on 12/7. Understanding and gratitude verbalized. All questions answered. Advised to call back with any questions/concerns.

## 2021-08-04 ENCOUNTER — Other Ambulatory Visit (HOSPITAL_BASED_OUTPATIENT_CLINIC_OR_DEPARTMENT_OTHER): Payer: Self-pay

## 2021-08-04 NOTE — Progress Notes (Signed)
      [address]       [place], [address]             [phone]       Patient: Home Provider: Office Consent for Telemedicine visit obtained.  Today's visit was completed via a real-time telehealth (see specific modality noted below). The patient/authorized person provided oral consent at the time of the visit to engage in a telemedicine encounter with the present provider at [facility]. The patient/authorized person was informed of the potential benefits, limitations, and risks of telemedicine. The patient/authorized person expressed understanding that the laws that protect confidentiality also apply to telemedicine. The patient/authorized person acknowledged understanding that telemedicine does not provide emergency services and that he or she would need to call 911 or proceed to the nearest hospital for help if such a need arose.   Total time spent in the clinical discussion 10 minutes.  Telehealth Modality: Phone visit (audio only)  I had a telephone visit with [name].  Overall he is doing well.  He does have a sinus infection thus elected to switch his appointment to a virtual visit.  He is scheduled to meet with radiation oncology in the upcoming week.  Path: FINAL MICROSCOPIC DIAGNOSIS:   A. 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  - Cauterized tumor is present at specimen margin.     Assessment / Plan:   65 year old male status post endobronchial valve removal and soft tissue mass resection which shows metastatic large cell neuroendocrine tumor.  He is scheduled to meet with our radiation oncology for further therapy of this.  He will also continue to follow-up with medical oncology.  Pain management per oncology service.  Follow-up as needed with me.   [name] 08/04/2021 7:54 AM

## 2021-08-05 ENCOUNTER — Other Ambulatory Visit: Payer: Self-pay

## 2021-08-05 ENCOUNTER — Inpatient Hospital Stay (HOSPITAL_BASED_OUTPATIENT_CLINIC_OR_DEPARTMENT_OTHER): Payer: Medicare Other | Admitting: Internal Medicine

## 2021-08-05 ENCOUNTER — Inpatient Hospital Stay (HOSPITAL_BASED_OUTPATIENT_CLINIC_OR_DEPARTMENT_OTHER): Payer: Medicare Other | Admitting: Nurse Practitioner

## 2021-08-05 VITALS — BP 110/88 | HR 124 | Temp 98.5°F | Resp 20 | Ht 69.0 in | Wt 167.2 lb

## 2021-08-05 DIAGNOSIS — Z515 Encounter for palliative care: Secondary | ICD-10-CM

## 2021-08-05 DIAGNOSIS — C7A1 Malignant poorly differentiated neuroendocrine tumors: Secondary | ICD-10-CM

## 2021-08-05 DIAGNOSIS — Z87891 Personal history of nicotine dependence: Secondary | ICD-10-CM | POA: Insufficient documentation

## 2021-08-05 DIAGNOSIS — C3411 Malignant neoplasm of upper lobe, right bronchus or lung: Secondary | ICD-10-CM | POA: Diagnosis not present

## 2021-08-05 DIAGNOSIS — Z79899 Other long term (current) drug therapy: Secondary | ICD-10-CM | POA: Insufficient documentation

## 2021-08-05 DIAGNOSIS — C3491 Malignant neoplasm of unspecified part of right bronchus or lung: Secondary | ICD-10-CM | POA: Diagnosis not present

## 2021-08-05 DIAGNOSIS — C7931 Secondary malignant neoplasm of brain: Secondary | ICD-10-CM

## 2021-08-05 DIAGNOSIS — Z7189 Other specified counseling: Secondary | ICD-10-CM

## 2021-08-05 DIAGNOSIS — K5900 Constipation, unspecified: Secondary | ICD-10-CM | POA: Diagnosis not present

## 2021-08-05 DIAGNOSIS — F419 Anxiety disorder, unspecified: Secondary | ICD-10-CM

## 2021-08-05 DIAGNOSIS — C7A8 Other malignant neuroendocrine tumors: Secondary | ICD-10-CM | POA: Insufficient documentation

## 2021-08-05 DIAGNOSIS — C7B8 Other secondary neuroendocrine tumors: Secondary | ICD-10-CM | POA: Insufficient documentation

## 2021-08-05 DIAGNOSIS — Z510 Encounter for antineoplastic radiation therapy: Secondary | ICD-10-CM | POA: Insufficient documentation

## 2021-08-05 DIAGNOSIS — R5383 Other fatigue: Secondary | ICD-10-CM

## 2021-08-05 DIAGNOSIS — R000 Tachycardia, unspecified: Secondary | ICD-10-CM | POA: Insufficient documentation

## 2021-08-05 DIAGNOSIS — G893 Neoplasm related pain (acute) (chronic): Secondary | ICD-10-CM

## 2021-08-05 MED ORDER — HYDROMORPHONE HCL 4 MG PO TABS: 4.0000 mg | ORAL_TABLET | ORAL | 0 refills | Status: DC | PRN

## 2021-08-05 NOTE — Discharge Summary (Signed)
Physician Discharge Summary  Patient ID: AYDDEN CUMPIAN MRN: [id number] DOB/AGE: 1956/07/12 65 y.o.  Admit date: 07/29/2021 Discharge date: 08/05/2021  Admission Diagnoses:lung cancer  Discharge Diagnoses:  Principal Problem:   Lung cancer [facility])  Patient Active Problem List   Diagnosis Date Noted   Lung cancer ([place]) 07/29/2021   Hemoptysis 06/06/2021   History of pulmonary embolus (PE) 04/29/2021   Anemia 04/29/2021   Hypotension due to hypovolemia 04/28/2021   Hypomagnesemia 04/08/2021   Port-A-Cath in place 04/07/2021   Protein-calorie malnutrition, severe 03/25/2021   PNA (pneumonia) 03/22/2021   Community acquired pneumonia 03/22/2021   Sepsis ([place]) 03/22/2021   CKD (chronic kidney disease), stage III ([place]) 03/22/2021   Hypoxia 02/28/2021   Dyspnea 02/28/2021   AKI (acute kidney injury) ([place]) 02/25/2021   AF (paroxysmal atrial fibrillation) ([place]) 02/25/2021   Pleural effusion 02/25/2021   Neutropenia, drug-induced ([place]) 02/24/2021   Positive blood cultures 02/17/2021   Positive blood culture 02/16/2021   Right flank pain 02/09/2021   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[place]) 01/30/2021   Encounter for antineoplastic chemotherapy 01/29/2021   Goals of care, counseling/discussion 01/14/2021   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[place]) 01/14/2021   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[place]) 01/14/2021   Acute back pain with sciatica 01/12/2021   Allergic rhinitis 01/12/2021   Amnesia 01/12/2021   Kidney stone 01/12/2021   Sciatica 01/12/2021   Bipolar disorder ([place]) 01/12/2021   S/P lobectomy of lung 12/24/2020   Solitary lung nodule 11/28/2020   Chest pain 11/07/2019   Gastroesophageal reflux disease    Cervical radiculopathy 10/17/2018   Preoperative clearance 10/04/2018   Palpitations    Coronary artery disease involving coronary bypass graft of native heart with angina pectoris ([place])    Transient loss of consciousness 03/24/2018   Ectopic  atrial tachycardia ([place]) 02/09/2018   Central chest pain 03/10/2017   Family hx-stroke 11/10/2016   Stroke-like episode ([place]) - R brain, s/p tPA 11/09/2016   Unstable angina ([place]) 09/07/2016   Claudication of both lower extremities ([place])    Pure hypercholesterolemia    Tobacco abuse disorder    CAD of autologous artery bypass graft without angina    Chest pain at rest 06/10/2016   Abnormal nuclear stress test - HIGH RISK 04/20/2016   Old MI (myocardial infarction)    Essential hypertension 02/26/2016   Ischemic cardiomyopathy 12/25/2015   Hyperlipidemia LDL goal <70 12/25/2015   Mild tobacco abuse in early remission 11/28/2015   Coronary artery disease involving native coronary artery of native heart with angina pectoris ([place]) 11/28/2015   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   Acute MI anterior wall first episode care [facility])    Precordial chest pain 03/07/2015   Gout attack 03/07/2015   Mixed bipolar I disorder ([place]) 03/07/2015   Fibromyalgia 07/09/2014   Gout 07/09/2014   Anxiety 07/09/2014   Depression 07/09/2014   Hepatitis C 85/63/1497   Eosinophilic esophagitis 02/63/7858    Discharged Condition: good HPI:  [name] is a 65 year old gentleman who had previously undergone a robotic assisted right upper lobectomy for a neuroendocrine tumor.  This hospitalization was complicated by prolonged air leak which required placement of endobronchial valves.  He has completed his course of adjuvant therapy but has developed new onset chest wall pain close to the previous incisions and had a palpable nodule underneath the assistant port.  He comes to the operating room today for removal of the endobronchial valves, and excision of this chest wall nodule.  Hospital  course:  The patient was brought to the hospital electively and on 07/29/2021 taken the OR where he underwent removal of a 9 mm endobronchial valve and  excision of a chest wall mass by Dr. Kipp Brood.  Postoperative hospital course:    Patient continues to have issues with pain.  We have asked palliative care to assist with recommendations for further control as it is extremely lifestyle limiting to the patient.  He is on chronic opiates since surgery and relief is very limited at this time on current regimen.  They saw the patient and gave recommendations which we will continue as outpatient.  He is otherwise remained quite stable.   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  - Cauterized tumor is present at specimen margin.   COMMENT:   The malignant cells are positive for CD56 and TTF-1 and have the same  morphologic features as the patient's right upper lung large cell  neuroendocrine carcinoma (slides on [id number] part F were reviewed).  Ancillary molecular testing may be performed on blocks A2 and A3.    INTRAOPERATIVE DIAGNOSIS:  A.  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 Melina Copa at 3:25 PM on  07/29/2021.   GROSS DESCRIPTION:   Received fresh for rapid intraoperative consult is a 3.2 x 2.5 x 1.6 cm  portion of soft tissue with an attached 2 x 0.8 cm portion of skin.  There is a suture attached identifying a nodule.  The margins are inked  prior to sectioning.  At the attached suture there is a 2 x 1.5 x 0.8 cm  indurated tan-white nodule.  A section of the nodule submitted for  frozen section.  Sections are submitted in 3 cassettes.  1 = tissue submitted for frozen section  2, 3 = additional sections (GRP  07/30/2021)    Final Diagnosis performed by Quay Burow, MD.   Electronically signed  08/03/2021  Technical and / or Professional components performed at [facility]. [facility], [place] [address] [place], [place] [address].   Immunohistochemistry Technical component (if applicable) was performed  at [facility]. [address], [place],  [place],  [address].   IMMUNOHISTOCHEMISTRY DISCLAIMER (if applicable):  Some of these immunohistochemical stains may have been developed and the  performance characteristics determine by [facility]. Some  may not have been cleared or approved by the U.S. Food and Drug  Administration. The FDA has determined that such clearance or approval  is not necessary. This test is used for clinical purposes. It should not  be regarded as investigational or for research. This laboratory is  certified under the [place]  (CLIA-88) as qualified to perform high complexity clinical laboratory  testing.  The controls stained appropriately Consults:  palliative care medicine  Significant Diagnostic Studies:  DG Chest 2 View  Result Date: 07/29/2021 CLINICAL DATA:  Preop for bronchoscopy with valve removal later today. Ex-smoker. Cardiomyopathy. EXAM: CHEST - 2 VIEW COMPARISON:  PET of 10 scratch a  PET 07/03/2021. Chest radiograph 06/26/2021 FINDINGS: Cervical spine fixation.  Median sternotomy for CABG. Midline trachea. Right Port-A-Cath tip at superior caval/atrial junction. Right-sided endobronchial valves again identified. Mild right hemidiaphragm elevation with mild right pleural thickening. No pneumothorax. Volume loss at the right lung base. No lobar consolidation. IMPRESSION: No acute process or interval change compared to 06/26/2021. Electronically Signed   By: Abigail Miyamoto M.D.   On: 07/29/2021 13:09   LONG TERM MONITOR (3-14 DAYS)  Result Date: 07/14/2021  Normal sinus  rhythm and sinus tachycardia with rare PACs and PVCs.  No AFib.  Symptoms were associated with sinus tachycardia.  Patch Wear Time:  13 days and 22 hours (2022-10-17T10:38:10-398 to 2022-10-31T09:00:44-0400) Patient had a min HR of 46 bpm, max HR of 167 bpm, and avg HR of 79 bpm. Predominant underlying rhythm was Sinus Rhythm. Isolated SVEs were rare (<1.0%), and no SVE Couplets or SVE Triplets were present. Isolated VEs were rare (<1.0%), VE Couplets were rare (<1.0%), and no VE Triplets were present.     Treatments: surgery 07/29/2021   Patient: [name] Pre-operative Diagnosis: Status post right upper lobectomy complicated by prolonged air leak. History of endobronchial valve placement x2 Chest wall nodule Post-operative Diagnosis: Same    Procedure(s): removal of 9 mm endobronchial valve Excision of chest wall mass   Surgeon(s) and Role:    * Lightfoot, Lucile Crater, MD - Primary   Discharge Exam: Blood pressure 110/73, pulse 72, temperature 97.8 F (36.6 C), temperature source Oral, resp. rate 15, height 5\' 9"  (1.753 m), weight 73.9 kg, SpO2 93 %.  General appearance: alert, cooperative, and no distress Heart: regular rate and rhythm Lungs: clear to auscultation bilaterally Disposition: Discharge disposition: 01-Home or Self Care       Discharge Instructions     Amb Referral to Palliative Care   Complete by: As directed    Discharge patient   Complete by: As directed    Discharge disposition: 01-Home or Self Care   Discharge patient date: 08/01/2021      Allergies as of 08/01/2021       Reactions   Prednisone Other (See Comments)   States that this med makes him "crazy"   Tetanus Toxoids Swelling, Other (See Comments)   Fever, Swelling of the arm    Wellbutrin [bupropion] Other (See Comments)   Crazy thoughts, nightmares   Indomethacin Other (See Comments)   rectal bleeding   Varenicline Other (See Comments)   Dreams        Medication List      STOP taking these medications    magnesium oxide 400 (240 Mg) MG tablet Commonly known as: MAG-OX   oxyCODONE 5 MG immediate release tablet Commonly known as: Roxicodone       TAKE these medications    acetaminophen 325 MG tablet Commonly known as: TYLENOL Take 2 tablets (650 mg total) by mouth 3 (three) times daily.   aspirin EC 81 MG tablet Take 81 mg by mouth daily. Swallow whole.   atorvastatin 80 MG tablet Commonly known as: LIPITOR TAKE 1 TABLET(80 MG) BY MOUTH DAILY   docusate sodium 100 MG capsule Commonly known as: COLACE Take 100 mg by mouth daily. 1-2   DULoxetine HCl 40 MG Cpep Take 40 mg by mouth every evening. What changed:  medication strength how much to take   ferrous gluconate 324 MG tablet Commonly known as: FERGON Take 1 tablet (324 mg total) by mouth daily with breakfast.   gabapentin 100  MG capsule Commonly known as: NEURONTIN Take 1 capsule (100 mg total) by mouth at bedtime.   HYDROmorphone 4 MG tablet Commonly known as: DILAUDID Take 1 tablet (4 mg total) by mouth every 4 (four) hours as needed for moderate pain.   lidocaine 5 % Commonly known as: Lidoderm Place 1 patch onto the skin daily. Remove & Discard patch within 12 hours or as directed by MD   lidocaine-prilocaine cream Commonly known as: EMLA Apply 1 application topically once as needed (port access).   metoprolol succinate 25 MG 24 hr tablet Commonly known as: TOPROL-XL Take 1 tablet (25 mg total) by mouth daily. What changed: when to take this   midodrine 2.5 MG tablet Commonly known as: PROAMATINE Take 1 tablet (2.5 mg total) by mouth 3 (three) times daily with meals. What changed:  when to take this reasons to take this   nitroGLYCERIN 0.4 MG SL tablet Commonly known as: NITROSTAT PLACE 1 TABLET UNDER THE TONGUE EVERY 5 MINUTES AS NEEDED FOR CHEST PAIN. 3 DOSES MAX What changed:  how much to take when to take this reasons to take this   ondansetron 8  MG tablet Commonly known as: ZOFRAN Take 1 tablet (8 mg total) by mouth every 8 (eight) hours as needed for nausea or vomiting. Starting 3 days after chemotherapy   prochlorperazine 10 MG tablet Commonly known as: COMPAZINE Take 1 tablet (10 mg total) by mouth every 6 (six) hours as needed.   ranolazine 1000 MG SR tablet Commonly known as: RANEXA Take 1 tablet (1,000 mg total) by mouth 2 (two) times daily.   Xarelto 20 MG Tabs tablet Generic drug: rivaroxaban Take 20 mg by mouth every evening.        Follow-up Information     Lightfoot, Lucile Crater, MD Follow up.   Specialty: Cardiothoracic Surgery Why: see discharge paperwork for f/u appt with MD Contact information: [place] [address] [phone]                 Signed: John Giovanni PA-C 08/05/2021, 9:26 AM

## 2021-08-05 NOTE — Progress Notes (Signed)
[place]  [phone] [phone]   Name: [name] Date: 08/05/2021 MRN: [id number]  DOB: 09-Jul-1956  Patient Care Team: [name], MD as PCP - General (Family Medicine) [name], MD as PCP - Cardiology (Cardiology) Icard, Octavio Graves, DO as Consulting Physician (Pulmonary Disease) Valrie Hart, RN as Oncology Nurse Navigator (Oncology) Maryanna Shape, NP as Nurse Practitioner (Oncology) Curt Bears, MD as Consulting Physician (Oncology)    REASON FOR CONSULTATION: [name] is a 65 y.o. 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 Palliative following after initial visit during recent hospitalization for ongoing symptom management support.      SOCIAL HISTORY:     reports that he quit smoking about 5 years ago. His smoking use included cigarettes. He has a 33.00 pack-year smoking history. He has never used smokeless tobacco. He reports that he does not currently use alcohol. He reports that he does not use drugs.  ADVANCE DIRECTIVES:  None on file   CODE STATUS:   PAST MEDICAL HISTORY: Past Medical History:  Diagnosis Date   Anemia    Anxiety    Arthritis    Basal cell carcinoma (BCC) of forehead    CAD (coronary artery disease)    a. 10/2015 ant STEMI >> LHC with 3 v CAD; oLAD tx with POBA >> emergent CABG. b. Multiple evals since that time, early graft failure of SVG-RCA by cath 03/2016. c. 2/19 PCI/DES x1 to pRCA, normal EF.   Carotid artery disease ([place])    a. 40-59% BICA 02/2018.   Depression    Dyspnea    Ectopic atrial tachycardia (HCC)    Esophageal reflux    eosinophil esophagitis   Family history of adverse reaction to  anesthesia    "sister has PONV" (06/21/2017)   Former tobacco use    Gout    Hepatitis C    "treated and cured" (06/21/2017)   High cholesterol    History of blood transfusion    History of kidney stones    Hypertension    Ischemic cardiomyopathy    a. EF 25-30% at intraop TEE 4/17  //  b. Limited Echo 5/17 - EF 45-50%, mild ant HK. c. EF 55-65% by cath 09/2017.   Migraine    "3-4/yr" (06/21/2017)   Myocardial infarction ([place]) 10/2015   Palpitations    Pneumonia    Sinus bradycardia    a. HR dropping into 40s in 02/2016 -> BB reduced.   Stroke [facility]) 10/2016   "small one; sometimes my memory/cognitive issues" (06/21/2017)   Symptomatic hypotension    a. 02/2016 ER visit -> meds reduced.   Syncope    Wears dentures    Wears glasses     PAST SURGICAL HISTORY:  Past Surgical History:  Procedure Laterality Date   25 GAUGE PARS PLANA VITRECTOMY WITH 20 GAUGE MVR PORT  12/31/2020   Procedure: 25 GAUGE PARS PLANA VITRECTOMY WITH 20 GAUGE MVR PORT;  Surgeon: [name], MD;  Location: [place];  Service: Thoracic;;   ANTERIOR CERVICAL DECOMP/DISCECTOMY FUSION N/A 10/17/2018   Procedure: Anterior Cervical Decompression Fusion - Cervical seven -Thoracic one;  Surgeon: Consuella Lose, MD;  Location: [place];  Service: Neurosurgery;  Laterality: N/A;   BASAL CELL CARCINOMA EXCISION     "  forehead   BIOPSY  07/20/2019   Procedure: BIOPSY;  Surgeon: Carol Ada, MD;  Location: [facility];  Service: Endoscopy;;   BIOPSY OF MEDIASTINAL MASS Right 07/29/2021   Procedure: RIGHT CHEST WALL MASS BIOPSY;  Surgeon: [name], MD;  Location: [place];  Service: Thoracic;  Laterality: Right;   BRONCHIAL BIOPSY  12/24/2020   Procedure: BRONCHIAL BIOPSIES;  Surgeon: Garner Nash, DO;  Location: [place] [facility];  Service: Pulmonary;;   BRONCHIAL BRUSHINGS  12/24/2020   Procedure: BRONCHIAL BRUSHINGS;  Surgeon: Garner Nash, DO;  Location: [place] [facility];  Service: Pulmonary;;    BRONCHIAL NEEDLE ASPIRATION BIOPSY  12/24/2020   Procedure: BRONCHIAL NEEDLE ASPIRATION BIOPSIES;  Surgeon: Garner Nash, DO;  Location: [place];  Service: Pulmonary;;   CARDIAC CATHETERIZATION N/A 11/28/2015   Procedure: Left Heart Cath and Coronary Angiography;  Surgeon: [name], MD;  Location: [place] [facility];  Service: Cardiovascular;  Laterality: N/A;   CARDIAC CATHETERIZATION N/A 11/28/2015   Procedure: Coronary Balloon Angioplasty;  Surgeon: [name], MD;  Location: [place] [facility];  Service: Cardiovascular;  Laterality: N/A;  ostial LAD   CARDIAC CATHETERIZATION N/A 11/28/2015   Procedure: Coronary/Graft Angiography;  Surgeon: [name], MD;  Location: [place] [facility];  Service: Cardiovascular;  Laterality: N/A;  coronaries only    CARDIAC CATHETERIZATION N/A 04/21/2016   Procedure: Left Heart Cath and Coronary Angiography;  Surgeon: Wellington Hampshire, MD;  Location: [place] [facility];  Service: Cardiovascular;  Laterality: N/A;   CARDIAC CATHETERIZATION N/A 06/14/2016   Procedure: Left Heart Cath and Cors/Grafts Angiography;  Surgeon: Lorretta Harp, MD;  Location: [place] [facility];  Service: Cardiovascular;  Laterality: N/A;   CARDIAC CATHETERIZATION N/A 09/08/2016   Procedure: Left Heart Cath and Cors/Grafts Angiography;  Surgeon: Wellington Hampshire, MD;  Location: [place] [facility];  Service: Cardiovascular;  Laterality: N/A;   CARDIAC CATHETERIZATION     CORONARY ARTERY BYPASS GRAFT N/A 11/28/2015   Procedure: CORONARY ARTERY BYPASS GRAFTING (CABG) TIMES FIVE USING LEFT INTERNAL MAMMARY ARTERY AND RIGHT GREATER SAPHENOUS,VIEN HARVEATED BY ENDOVIEN, INTRAOPPRATIVE TEE;  Surgeon: Gaye Pollack, MD;  Location: [place];  Service: Open Heart Surgery;  Laterality: N/A;   CORONARY STENT INTERVENTION N/A 10/05/2017   Procedure: CORONARY STENT INTERVENTION;  Surgeon: [name], MD;  Location: [place] [facility];  Service: Cardiovascular;   Laterality: N/A;   ESOPHAGOGASTRODUODENOSCOPY (EGD) WITH PROPOFOL N/A 07/20/2019   Procedure: ESOPHAGOGASTRODUODENOSCOPY (EGD) WITH PROPOFOL;  Surgeon: Carol Ada, MD;  [facility];  Service: Endoscopy;  Laterality: N/A;   ESOPHAGOGASTRODUODENOSCOPY (EGD) WITH PROPOFOL N/A 01/30/2021   Procedure: ESOPHAGOGASTRODUODENOSCOPY (EGD) WITH PROPOFOL;  Surgeon: Carol Ada, MD;  [facility];  Service: Endoscopy;  Laterality: N/A;   FIDUCIAL MARKER PLACEMENT  12/24/2020   Procedure: FIDUCIAL DYE MARKER PLACEMENT;  Surgeon: Garner Nash, DO;  Location: [place] [facility];  Service: Pulmonary;;   HUMERUS SURGERY Right 1969   "tumor inside bone; filled it w/bone chips"   INTERCOSTAL NERVE BLOCK Right 12/24/2020   Procedure: INTERCOSTAL NERVE BLOCK;  Surgeon: [name], MD;  Location: [place];  Service: Thoracic;  Laterality: Right;   IR IMAGING GUIDED PORT INSERTION  02/06/2021   LEFT HEART CATH AND CORS/GRAFTS ANGIOGRAPHY N/A 03/11/2017   Procedure: Left Heart Cath and Cors/Grafts Angiography;  Surgeon: Leonie Man, MD;  Location: [place] [facility];  Service: Cardiovascular;  Laterality: N/A;   LEFT HEART CATH AND  CORS/GRAFTS ANGIOGRAPHY N/A 10/05/2017   Procedure: LEFT HEART CATH AND CORS/GRAFTS ANGIOGRAPHY;  Surgeon: [name], MD;  Location: [place] [facility];  Service: Cardiovascular;  Laterality: N/A;   LEFT HEART CATH AND CORS/GRAFTS ANGIOGRAPHY N/A 04/11/2019   Procedure: LEFT HEART CATH AND CORS/GRAFTS ANGIOGRAPHY;  Surgeon: [name], MD;  Location: [place] [facility];  Service: Cardiovascular;  Laterality: N/A;   NODE DISSECTION Right 12/24/2020   Procedure: NODE DISSECTION;  Surgeon: [name], MD;  Location: [place];  Service: Thoracic;  Laterality: Right;   PERIPHERAL VASCULAR CATHETERIZATION N/A 06/14/2016   Procedure: Lower Extremity Angiography;  Surgeon: Lorretta Harp, MD;  Location: [place] [facility];  Service:  Cardiovascular;  Laterality: N/A;   VIDEO BRONCHOSCOPY WITH ENDOBRONCHIAL NAVIGATION Right 12/24/2020   Procedure: VIDEO BRONCHOSCOPY WITH ENDOBRONCHIAL NAVIGATION;  Surgeon: Garner Nash, DO;  Location: [place];  Service: Pulmonary;  Laterality: Right;   VIDEO BRONCHOSCOPY WITH ENDOBRONCHIAL ULTRASOUND N/A 12/24/2020   Procedure: VIDEO BRONCHOSCOPY WITH ENDOBRONCHIAL ULTRASOUND;  Surgeon: Garner Nash, DO;  Location: [place];  Service: Pulmonary;  Laterality: N/A;   VIDEO BRONCHOSCOPY WITH INSERTION OF INTERBRONCHIAL VALVE (IBV) N/A 12/31/2020   Procedure: VIDEO BRONCHOSCOPY WITH INSERTION OF INTERBRONCHIAL VALVE (IBV).VALVE IN CARTRIDGE 51mm,9mm. CHEST TUBE PLACEMENT.;  Surgeon: [name], MD;  [facility];  Service: Thoracic;  Laterality: N/A;   VIDEO BRONCHOSCOPY WITH INSERTION OF INTERBRONCHIAL VALVE (IBV) N/A 07/29/2021   Procedure: VIDEO BRONCHOSCOPY WITH REMOVAL OF INTERBRONCHIAL VALVE (IBV);  Surgeon: [name], MD;  [facility];  Service: Thoracic;  Laterality: N/A;    HEMATOLOGY/ONCOLOGY HISTORY:  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[place])  01/14/2021 Initial Diagnosis   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[place])   02/09/2021 -  Chemotherapy    Patient is on Treatment Plan: LUNG SMALL CELL - LIMITED STAGE CISPLATIN D1 + ETOPOSIDE D1-3 Q21D       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  MEDICATIONS:  Current Outpatient Medications  Medication Sig Dispense Refill   acetaminophen (TYLENOL) 325 MG tablet Take 2 tablets (650 mg total) by  mouth 3 (three) times daily.     aspirin EC 81 MG tablet Take 81 mg by mouth daily. Swallow whole.     atorvastatin (LIPITOR) 80 MG tablet TAKE 1 TABLET(80 MG) BY MOUTH DAILY 90 tablet 2   docusate sodium (COLACE) 100 MG capsule Take 100 mg by mouth daily. 1-2     DULoxetine 40 MG CPEP Take 40 mg by mouth every evening. 30 capsule 1   ferrous gluconate (FERGON) 324 MG tablet Take 1 tablet (324 mg total) by mouth daily with breakfast. 30 tablet 2   gabapentin (NEURONTIN) 100 MG capsule Take 1 capsule (100 mg total) by mouth at bedtime. 30 capsule 1   HYDROmorphone (DILAUDID) 4 MG tablet Take 1 tablet (4 mg total) by mouth every 4 (four) hours as needed for moderate pain. 30 tablet 0   lidocaine (LIDODERM) 5 % Place 1 patch onto the skin daily. Remove & Discard patch within 12 hours or as directed by MD 30 patch 0  lidocaine-prilocaine (EMLA) cream Apply 1 application topically once as needed (port access).     metoprolol succinate (TOPROL-XL) 25 MG 24 hr tablet Take 1 tablet (25 mg total) by mouth daily. (Patient taking differently: Take 25 mg by mouth every evening.) 90 tablet 3   midodrine (PROAMATINE) 2.5 MG tablet Take 1 tablet (2.5 mg total) by mouth 3 (three) times daily with meals. (Patient taking differently: Take 2.5 mg by mouth 3 (three) times daily as needed (Hypotension).) 90 tablet 5   nitroGLYCERIN (NITROSTAT) 0.4 MG SL tablet PLACE 1 TABLET UNDER THE TONGUE EVERY 5 MINUTES AS NEEDED FOR CHEST PAIN. 3 DOSES MAX (Patient not taking: Reported on 08/05/2021) 25 tablet 4   ondansetron (ZOFRAN) 8 MG tablet Take 1 tablet (8 mg total) by mouth every 8 (eight) hours as needed for nausea or vomiting. Starting 3 days after chemotherapy 30 tablet 2   oxyCODONE ER (XTAMPZA ER) 13.5 MG C12A Take 13.5 mg by mouth every 12 (twelve) hours. 60 capsule 0   prochlorperazine (COMPAZINE) 10 MG tablet Take 1 tablet (10 mg total) by mouth every 6 (six) hours as needed. 30 tablet 2   ranolazine (RANEXA) 1000 MG  SR tablet Take 1 tablet (1,000 mg total) by mouth 2 (two) times daily. 180 tablet 3   XARELTO 20 MG TABS tablet Take 20 mg by mouth every evening.     No current facility-administered medications for this visit.    VITAL SIGNS: There were no vitals taken for this visit. There were no vitals filed for this visit.  Estimated body mass index is 24.69 kg/m as calculated from the following:   Height as of an earlier encounter on 08/05/21: 5\' 9"  (1.753 m).   Weight as of an earlier encounter on 08/05/21: 167 lb 3.2 oz (75.8 kg).  LABS: CBC:    Component Value Date/Time   WBC 7.7 07/29/2021 1318   HGB 13.8 07/29/2021 1318   HGB 10.2 (L) 05/11/2021 0750   HCT 41.9 07/29/2021 1318   PLT 233 07/29/2021 1318   PLT 365 05/11/2021 0750   MCV 93.3 07/29/2021 1318   NEUTROABS 8.0 (H) 06/26/2021 1654   LYMPHSABS 1.7 06/26/2021 1654   MONOABS 0.9 06/26/2021 1654   EOSABS 0.1 06/26/2021 1654   BASOSABS 0.0 06/26/2021 1654   Comprehensive Metabolic Panel:    Component Value Date/Time   NA 137 07/29/2021 1318   K 4.0 07/29/2021 1318   CL 102 07/29/2021 1318   CO2 24 07/29/2021 1318   BUN 19 07/29/2021 1318   CREATININE 1.30 (H) 07/29/2021 1318   CREATININE 1.19 05/11/2021 0750   CREATININE 1.00 02/12/2016 1038   GLUCOSE 119 (H) 07/29/2021 1318   CALCIUM 10.2 07/29/2021 1318   AST 26 07/29/2021 1318   AST 9 (L) 05/11/2021 0750   ALT 26 07/29/2021 1318   ALT 8 05/11/2021 0750   ALKPHOS 89 07/29/2021 1318   BILITOT 0.5 07/29/2021 1318   BILITOT 0.5 05/11/2021 0750   PROT 7.4 07/29/2021 1318   PROT 6.7 11/08/2017 0833   ALBUMIN 4.0 07/29/2021 1318   ALBUMIN 4.4 11/08/2017 0833    RADIOGRAPHIC STUDIES: DG Chest 2 View  Result Date: 07/29/2021 CLINICAL DATA:  Preop for bronchoscopy with valve removal later today. Ex-smoker. Cardiomyopathy. EXAM: CHEST - 2 VIEW COMPARISON:  PET of 10 scratch a PET 07/03/2021. Chest radiograph 06/26/2021 FINDINGS: Cervical spine fixation.  Median  sternotomy for CABG. Midline trachea. Right Port-A-Cath tip at superior caval/atrial junction. Right-sided endobronchial valves again identified.  Mild right hemidiaphragm elevation with mild right pleural thickening. No pneumothorax. Volume loss at the right lung base. No lobar consolidation. IMPRESSION: No acute process or interval change compared to 06/26/2021. Electronically Signed   By: Abigail Miyamoto M.D.   On: 07/29/2021 13:09   LONG TERM MONITOR (3-14 DAYS)  Result Date: 07/14/2021  Normal sinus rhythm and sinus tachycardia with rare PACs and PVCs.  No AFib.  Symptoms were associated with sinus tachycardia.  Patch Wear Time:  13 days and 22 hours (2022-10-17T10:38:10-398 to 2022-10-31T09:00:44-0400) Patient had a min HR of 46 bpm, max HR of 167 bpm, and avg HR of 79 bpm. Predominant underlying rhythm was Sinus Rhythm. Isolated SVEs were rare (<1.0%), and no SVE Couplets or SVE Triplets were present. Isolated VEs were rare (<1.0%), VE Couplets were rare (<1.0%), and no VE Triplets were present.    PERFORMANCE STATUS (ECOG) : 1 - Symptomatic but completely ambulatory  Review of Systems  Constitutional:  Positive for fatigue.  Musculoskeletal:  Positive for arthralgias.  Neurological:        Anxiety   Unless otherwise noted, a complete review of systems is negative.  Physical Exam General: NAD, well developed  Cardiovascular: regular rate and rhythm Pulmonary: clear ant fields Abdomen: soft, nontender, + bowel sounds Extremities: no edema, no joint deformities Skin: no rashes Neurological: AAO x3, mood appropriate   IMPRESSION:  Mr. Stefan comes into the clinic today for hospital follow-up and ongoing symptom management.  He was seen by my palliative colleagues during his recent hospitalization.  I introduced myself and Hulan Fess in addition to a review of palliative's role in collaboration with his oncology team.  Patient verbalized understanding and appreciation of continued  support.  Mr. Isa lives at home with his wife.  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  We discussed in detail patient's current pain.  Johannes shares most of his pain is in his right lower back and right chest area due to tumor burden.  He is observed grimacing during our visit expressing he did not take any medication today with awareness he had to drive to his appointments.  During his hospitalization patient was started on gabapentin, oxycodone, hydromorphone as needed, with recommendations for Tylenol.  Since discharge he has been taking all medications however due to his insurance he is taking Xtampza ER versus OxyContin.  States long-acting is effective with his pain management and he can tell a difference with less use of hydromorphone.  However he has concerns that he later developed a headache and sometimes intermittent nausea.  This generally happens after he lies down and take a nap and is there when he wakes back up.  He has not tried any interventions to assist with symptoms.  Discussed continuing Xtampza ER to see if symptoms will resolve in addition to utilizing Tylenol and Zofran to assist with headaches and nausea.  Wallice states he had similar responses to the OxyContin when hospitalized.  He is agreeable to continue however will stop taking if this does not improve.  He is currently taking the hydromorphone every 4-6 hours as needed for breakthrough pain.  He shares that he is experiencing some anxiety regarding his overall health status.  He has been on multiple benzodiazepines in the past.  He is leery of restarting due to the feelings of withdrawal once deciding to discontinue use.  He relies on daily prayer and meditation  to cope with his anxiety.  We discussed continuing to monitor and if anxiety continues to interfere with his daily life we can further discuss consideration of appropriate  medications.  Stanely is currently taking Dulcolax and senna as needed for constipation.  We discussed the use of MiraLAX however he prefers taking the Dulcolax and Senna. Feels this has been effective. Discussed the importance of daily use of senna in the setting of opioid use.   I discussed the importance of continued conversation with family and their medical providers regarding overall plan of care and treatment options, ensuring decisions are within the context of the patients values and GOCs.  PLAN:  Will continue to try [facility] ER for pain.  Tylenol TID for headaches and multimodal interventions Continue hydromorphone as needed for breakthrough pain Senna daily for constipation Will continue to focus on deep breathing, daily meditation, and prayer to assist with anxiety coping.  I will plan to see him back in 1-2 weeks in collaboration with his other oncology appointments.    Patient expressed understanding and was in agreement with this plan. He also understands that He can call the clinic at any time with any questions, concerns, or complaints.     Time Total: 55 min.   Visit consisted of counseling and education dealing with the complex and emotionally intense issues of symptom management and palliative care in the setting of serious and potentially life-threatening illness.Greater than 50%  of this time was spent counseling and coordinating care related to the above assessment and plan.  Signed by: Alda Lea, AGPCNP-BC Palliative Medicine Team

## 2021-08-05 NOTE — Progress Notes (Signed)
  Outpatient Palliative Care  [phone] ________________________________ Nursing Assessment:  Name: [name]        MRN: [id number]  Date of Service: 08/05/2021 DOB: March 29, 1956 Visit Type: New patient-follow up from hospital   Support at Visit: Unaccompanied  Review of Systems: General: [name] reports no fatigue/weakness. Stable weight. Reports getting some sleep-is woken up by pain at times. Neuro: History of dizziness when standing up at times.  Reports blurred vision at times-has had this since receiving treatment and reports an improvement.  Cardiac: None Pulmonary: None  GI: Reports an appetite at 75% normal. Describes as "up and down".  Reports N/V. Stated he vomited this morning before appt. Reports Compazine does help.  Reports constipation-having a BM every couple days and has to force them.  GU: Reports difficulty starting to urinate but no problems after this.  Integumentary: None Psychological: [name] reports a "lingering" anxiety due to his diagnosis. He reports no depression. Expresses frustration at not being able to do as much as he used to due to pain. Emotional listening and support provided.    Medication Changes/Additions: None    Pain Review: Scale of 0-10: 8 Location: Lower back/spine wrapping around to front of abdomen-only on R side Description: Sharp, cramping Frequency: Fairly constant-reports being in pain 75%-80% of day Relief: pain medications help some, position changes-standing, lying down   Social Review: Living Situation: lives with wife Support: wife, has 3 children-2 live in [place], 1 lives in [place] in the last 3 months? None  Assistive Device Use? None  Functional Status: Reports completing ADLs independently. 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   ACP Form Review: I asked [name] if he had an Forensic scientist. He said that he didn't but that he was  going to his lawyer soon to go over his Living Will and would complete one there. I did tell him that we have a clinic here if he needs to complete it here. Understanding verbalized.   Family/Patient Concerns: [name] was concerned with his pain management/control and the way it is totally affecting his life and anxiety.   Provider notified of assessment and patient/family concerns. Patient instructed to call with any questions or concerns.

## 2021-08-05 NOTE — Progress Notes (Signed)
[place] [phone]   [phone]  OFFICE PROGRESS NOTE  [name], MD [place] [address]  DIAGNOSIS:  Stage IV (pT1c, N1, M1b) large cell neuroendocrine carcinoma.  He presented with a right upper lobe lung nodule and hilar lymphadenopathy.  He was diagnosed in April 2022. He also had a small metastatic brain lesion.  The patient has local recurrence of his disease and the right chest wall diagnosed in November 2022.   PDL1: 0%   PRIOR THERAPY:  1) Robotic assisted thoracic surgery with a right upper lobectomy which was performed on 12/24/2020.  2) SRS to a small metastatic brain lesion completed on February 13, 2021 under the care of Dr. Lisbeth Renshaw.   3) Adjuvant chemotherapy with cisplatin 80 mg/m2 on day 1, etoposide 100 mg per metered squared on days 1, 2, and 3, IV every 3 weeks. First dose on 02/09/21.  Status post 4 cycles.   CURRENT THERAPY: The patient is expected to start palliative radiotherapy to the local disease recurrence on the right chest wall under the care of Dr. Lisbeth Renshaw soon   INTERVAL HISTORY: [name] 65 y.o. male returns back to the clinic today for follow-up visit.  He has been complaining of pain on the right side of the chest and PET scan on June 30, 2021 showed asymmetric nodular hypermetabolic activity within the posterior right chest wall near the thoracotomy site concerning for early chest wall recurrence.  He was referred to Dr. Kipp Brood and underwent excision of chest wall mass as well as removal of the endobronchial valve on July 29, 2021.  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  The patient is referred back to [name] today for evaluation and recommendation regarding his condition.  He is currently on Dilaudid 4 mg every 4 hours as needed for pain.  He also has oxycodone ER every 12  hours.  He denied having any shortness of breath, cough or hemoptysis.  He denied having any nausea, vomiting, diarrhea or constipation.  He has no headache or visual changes.   MEDICAL HISTORY: Past Medical History:  Diagnosis Date   Anemia    Anxiety    Arthritis    Basal cell carcinoma (BCC) of forehead    CAD (coronary artery disease)    a. 10/2015 ant STEMI >> LHC with 3 v CAD; oLAD tx with POBA >> emergent CABG. b. Multiple evals since that time, early graft failure of SVG-RCA by cath 03/2016. c. 2/19 PCI/DES x1 to pRCA, normal EF.   Carotid artery disease ([place])    a. 40-59% BICA 02/2018.   Depression    Dyspnea    Ectopic atrial tachycardia (HCC)    Esophageal reflux    eosinophil esophagitis   Family history of adverse reaction to anesthesia    "sister has PONV" (06/21/2017)   Former tobacco use    Gout    Hepatitis C    "treated and cured" (06/21/2017)   High cholesterol    History of blood transfusion    History of kidney stones    Hypertension    Ischemic cardiomyopathy    a. EF 25-30% at intraop TEE 4/17  //  b. Limited Echo 5/17 - EF 45-50%, mild ant HK. c. EF 55-65% by cath 09/2017.   Migraine    "3-4/yr" (06/21/2017)   Myocardial infarction ([place]) 10/2015  Palpitations    Pneumonia    Sinus bradycardia    a. HR dropping into 40s in 02/2016 -> BB reduced.   Stroke [facility]) 10/2016   "small one; sometimes my memory/cognitive issues" (06/21/2017)   Symptomatic hypotension    a. 02/2016 ER visit -> meds reduced.   Syncope    Wears dentures    Wears glasses     ALLERGIES:  is allergic to prednisone, tetanus toxoids, wellbutrin [bupropion], indomethacin, and varenicline.  MEDICATIONS:  Current Outpatient Medications  Medication Sig Dispense Refill   acetaminophen (TYLENOL) 325 MG tablet Take 2 tablets (650 mg total) by mouth 3 (three) times daily.     aspirin EC 81 MG tablet Take 81 mg by mouth daily. Swallow whole.     atorvastatin (LIPITOR) 80 MG tablet TAKE 1  TABLET(80 MG) BY MOUTH DAILY 90 tablet 2   docusate sodium (COLACE) 100 MG capsule Take 100 mg by mouth daily. 1-2     DULoxetine 40 MG CPEP Take 40 mg by mouth every evening. 30 capsule 1   ferrous gluconate (FERGON) 324 MG tablet Take 1 tablet (324 mg total) by mouth daily with breakfast. 30 tablet 2   gabapentin (NEURONTIN) 100 MG capsule Take 1 capsule (100 mg total) by mouth at bedtime. 30 capsule 1   HYDROmorphone (DILAUDID) 4 MG tablet Take 1 tablet (4 mg total) by mouth every 4 (four) hours as needed for moderate pain. 30 tablet 0   lidocaine (LIDODERM) 5 % Place 1 patch onto the skin daily. Remove & Discard patch within 12 hours or as directed by MD (Patient not taking: Reported on 07/21/2021) 30 patch 0   lidocaine-prilocaine (EMLA) cream Apply 1 application topically once as needed (port access). (Patient not taking: Reported on 07/21/2021)     metoprolol succinate (TOPROL-XL) 25 MG 24 hr tablet Take 1 tablet (25 mg total) by mouth daily. (Patient taking differently: Take 25 mg by mouth every evening.) 90 tablet 3   midodrine (PROAMATINE) 2.5 MG tablet Take 1 tablet (2.5 mg total) by mouth 3 (three) times daily with meals. (Patient taking differently: Take 2.5 mg by mouth 3 (three) times daily as needed (Hypotension).) 90 tablet 5   nitroGLYCERIN (NITROSTAT) 0.4 MG SL tablet PLACE 1 TABLET UNDER THE TONGUE EVERY 5 MINUTES AS NEEDED FOR CHEST PAIN. 3 DOSES MAX (Patient taking differently: 0.4 mg every 5 (five) minutes as needed for chest pain. PLACE 1 TABLET UNDER THE TONGUE EVERY 5 MINUTES AS NEEDED FOR CHEST PAIN. 3 DOSES MAX) 25 tablet 4   ondansetron (ZOFRAN) 8 MG tablet Take 1 tablet (8 mg total) by mouth every 8 (eight) hours as needed for nausea or vomiting. Starting 3 days after chemotherapy 30 tablet 2   oxyCODONE ER (XTAMPZA ER) 13.5 MG C12A Take 13.5 mg by mouth every 12 (twelve) hours. 60 capsule 0   prochlorperazine (COMPAZINE) 10 MG tablet Take 1 tablet (10 mg total) by mouth  every 6 (six) hours as needed. 30 tablet 2   ranolazine (RANEXA) 1000 MG SR tablet Take 1 tablet (1,000 mg total) by mouth 2 (two) times daily. 180 tablet 3   XARELTO 20 MG TABS tablet Take 20 mg by mouth every evening.     No current facility-administered medications for this visit.    SURGICAL HISTORY:  Past Surgical History:  Procedure Laterality Date   25 GAUGE PARS PLANA VITRECTOMY WITH 20 GAUGE MVR PORT  12/31/2020   Procedure: 25 GAUGE PARS PLANA VITRECTOMY WITH 20  GAUGE MVR PORT;  Surgeon: [name], MD;  Location: [place];  Service: Thoracic;;   ANTERIOR CERVICAL DECOMP/DISCECTOMY FUSION N/A 10/17/2018   Procedure: Anterior Cervical Decompression Fusion - Cervical seven -Thoracic one;  Surgeon: Consuella Lose, MD;  Location: [place];  Service: Neurosurgery;  Laterality: N/A;   BASAL CELL CARCINOMA EXCISION     "forehead   BIOPSY  07/20/2019   Procedure: BIOPSY;  Surgeon: Carol Ada, MD;  Location: [facility];  Service: Endoscopy;;   BIOPSY OF MEDIASTINAL MASS Right 07/29/2021   Procedure: RIGHT CHEST WALL MASS BIOPSY;  Surgeon: [name], MD;  Location: [place];  Service: Thoracic;  Laterality: Right;   BRONCHIAL BIOPSY  12/24/2020   Procedure: BRONCHIAL BIOPSIES;  Surgeon: Garner Nash, DO;  Location: [place] [facility];  Service: Pulmonary;;   BRONCHIAL BRUSHINGS  12/24/2020   Procedure: BRONCHIAL BRUSHINGS;  Surgeon: Garner Nash, DO;  Location: [place];  Service: Pulmonary;;   BRONCHIAL NEEDLE ASPIRATION BIOPSY  12/24/2020   Procedure: BRONCHIAL NEEDLE ASPIRATION BIOPSIES;  Surgeon: Garner Nash, DO;  Location: [place];  Service: Pulmonary;;   CARDIAC CATHETERIZATION N/A 11/28/2015   Procedure: Left Heart Cath and Coronary Angiography;  Surgeon: [name], MD;  Location: [place] [facility];  Service: Cardiovascular;  Laterality: N/A;   CARDIAC CATHETERIZATION N/A 11/28/2015   Procedure: Coronary Balloon Angioplasty;  Surgeon:  [name], MD;  Location: [place] [facility];  Service: Cardiovascular;  Laterality: N/A;  ostial LAD   CARDIAC CATHETERIZATION N/A 11/28/2015   Procedure: Coronary/Graft Angiography;  Surgeon: [name], MD;  Location: [place] [facility];  Service: Cardiovascular;  Laterality: N/A;  coronaries only    CARDIAC CATHETERIZATION N/A 04/21/2016   Procedure: Left Heart Cath and Coronary Angiography;  Surgeon: Wellington Hampshire, MD;  Location: [place] [facility];  Service: Cardiovascular;  Laterality: N/A;   CARDIAC CATHETERIZATION N/A 06/14/2016   Procedure: Left Heart Cath and Cors/Grafts Angiography;  Surgeon: Lorretta Harp, MD;  Location: [place] [facility];  Service: Cardiovascular;  Laterality: N/A;   CARDIAC CATHETERIZATION N/A 09/08/2016   Procedure: Left Heart Cath and Cors/Grafts Angiography;  Surgeon: Wellington Hampshire, MD;  Location: [place] [facility];  Service: Cardiovascular;  Laterality: N/A;   CARDIAC CATHETERIZATION     CORONARY ARTERY BYPASS GRAFT N/A 11/28/2015   Procedure: CORONARY ARTERY BYPASS GRAFTING (CABG) TIMES FIVE USING LEFT INTERNAL MAMMARY ARTERY AND RIGHT GREATER SAPHENOUS,VIEN HARVEATED BY ENDOVIEN, INTRAOPPRATIVE TEE;  Surgeon: Gaye Pollack, MD;  Location: [place];  Service: Open Heart Surgery;  Laterality: N/A;   CORONARY STENT INTERVENTION N/A 10/05/2017   Procedure: CORONARY STENT INTERVENTION;  Surgeon: [name], MD;  Location: [place] [facility];  Service: Cardiovascular;  Laterality: N/A;   ESOPHAGOGASTRODUODENOSCOPY (EGD) WITH PROPOFOL N/A 07/20/2019   Procedure: ESOPHAGOGASTRODUODENOSCOPY (EGD) WITH PROPOFOL;  Surgeon: Carol Ada, MD;  [facility];  Service: Endoscopy;  Laterality: N/A;   ESOPHAGOGASTRODUODENOSCOPY (EGD) WITH PROPOFOL N/A 01/30/2021   Procedure: ESOPHAGOGASTRODUODENOSCOPY (EGD) WITH PROPOFOL;  Surgeon: Carol Ada, MD;  [facility];  Service: Endoscopy;  Laterality: N/A;   FIDUCIAL MARKER  PLACEMENT  12/24/2020   Procedure: FIDUCIAL DYE MARKER PLACEMENT;  Surgeon: Garner Nash, DO;  Location: [place] [facility];  Service: Pulmonary;;   HUMERUS SURGERY Right 1969   "tumor inside bone; filled it w/bone chips"   INTERCOSTAL NERVE BLOCK Right 12/24/2020   Procedure: INTERCOSTAL NERVE BLOCK;  Surgeon: [name], MD;  [facility];  Service: Thoracic;  Laterality: Right;   IR IMAGING GUIDED PORT INSERTION  02/06/2021   LEFT HEART CATH AND CORS/GRAFTS ANGIOGRAPHY N/A 03/11/2017   Procedure: Left Heart Cath and Cors/Grafts Angiography;  Surgeon: Leonie Man, MD;  Location: [place] [facility];  Service: Cardiovascular;  Laterality: N/A;   LEFT HEART CATH AND CORS/GRAFTS ANGIOGRAPHY N/A 10/05/2017   Procedure: LEFT HEART CATH AND CORS/GRAFTS ANGIOGRAPHY;  Surgeon: [name], MD;  Location: [place] [facility];  Service: Cardiovascular;  Laterality: N/A;   LEFT HEART CATH AND CORS/GRAFTS ANGIOGRAPHY N/A 04/11/2019   Procedure: LEFT HEART CATH AND CORS/GRAFTS ANGIOGRAPHY;  Surgeon: [name], MD;  Location: [place] [facility];  Service: Cardiovascular;  Laterality: N/A;   NODE DISSECTION Right 12/24/2020   Procedure: NODE DISSECTION;  Surgeon: [name], MD;  Location: [place];  Service: Thoracic;  Laterality: Right;   PERIPHERAL VASCULAR CATHETERIZATION N/A 06/14/2016   Procedure: Lower Extremity Angiography;  Surgeon: Lorretta Harp, MD;  Location: [place] [facility];  Service: Cardiovascular;  Laterality: N/A;   VIDEO BRONCHOSCOPY WITH ENDOBRONCHIAL NAVIGATION Right 12/24/2020   Procedure: VIDEO BRONCHOSCOPY WITH ENDOBRONCHIAL NAVIGATION;  Surgeon: Garner Nash, DO;  Location: [place];  Service: Pulmonary;  Laterality: Right;   VIDEO BRONCHOSCOPY WITH ENDOBRONCHIAL ULTRASOUND N/A 12/24/2020   Procedure: VIDEO BRONCHOSCOPY WITH ENDOBRONCHIAL ULTRASOUND;  Surgeon: Garner Nash, DO;  Location: [place];  Service: Pulmonary;  Laterality:  N/A;   VIDEO BRONCHOSCOPY WITH INSERTION OF INTERBRONCHIAL VALVE (IBV) N/A 12/31/2020   Procedure: VIDEO BRONCHOSCOPY WITH INSERTION OF INTERBRONCHIAL VALVE (IBV).VALVE IN CARTRIDGE 7m,9mm. CHEST TUBE PLACEMENT.;  Surgeon: LLajuana Matte MD;  [facility];  Service: Thoracic;  Laterality: N/A;   VIDEO BRONCHOSCOPY WITH INSERTION OF INTERBRONCHIAL VALVE (IBV) N/A 07/29/2021   Procedure: VIDEO BRONCHOSCOPY WITH REMOVAL OF INTERBRONCHIAL VALVE (IBV);  Surgeon: LLajuana Matte MD;  [facility];  Service: Thoracic;  Laterality: N/A;    REVIEW OF SYSTEMS:  Constitutional: positive for fatigue Eyes: negative Ears, nose, mouth, throat, and face: negative Respiratory: positive for pleurisy/chest pain Cardiovascular: negative Gastrointestinal: negative Genitourinary:negative Integument/breast: negative Hematologic/lymphatic: negative Musculoskeletal:negative Neurological: negative Behavioral/Psych: negative Endocrine: negative Allergic/Immunologic: negative   PHYSICAL EXAMINATION: General appearance: alert, cooperative, fatigued, and no distress Head: Normocephalic, without obvious abnormality, atraumatic Neck: no adenopathy, no JVD, supple, symmetrical, trachea midline, and thyroid not enlarged, symmetric, no tenderness/mass/nodules Lymph nodes: Cervical, supraclavicular, and axillary nodes normal. Resp: clear to auscultation bilaterally Back: negative Cardio: Tachycardic GI: soft, non-tender; bowel sounds normal; no masses,  no organomegaly Extremities: extremities normal, atraumatic, no cyanosis or edema Neurologic: Alert and oriented X 3, normal strength and tone. Normal symmetric reflexes. Normal coordination and gait  ECOG PERFORMANCE STATUS: 1 - Symptomatic but completely ambulatory  Blood pressure 110/88, pulse (!) 124, temperature 98.5 F (36.9 C), temperature source Tympanic, resp. rate 20, height 5' 9"  (1.753 m), weight 167 lb 3.2 oz (75.8 kg), SpO2 97  %.  LABORATORY DATA: Lab Results  Component Value Date   WBC 7.7 07/29/2021   HGB 13.8 07/29/2021   HCT 41.9 07/29/2021   MCV 93.3 07/29/2021   PLT 233 07/29/2021      Chemistry      Component Value Date/Time   NA 137 07/29/2021 1318   K 4.0 07/29/2021 1318   CL 102 07/29/2021 1318   CO2 24 07/29/2021 1318   BUN 19 07/29/2021 1318   CREATININE 1.30 (H) 07/29/2021 1318   CREATININE  1.19 05/11/2021 0750   CREATININE 1.00 02/12/2016 1038      Component Value Date/Time   CALCIUM 10.2 07/29/2021 1318   ALKPHOS 89 07/29/2021 1318   AST 26 07/29/2021 1318   AST 9 (L) 05/11/2021 0750   ALT 26 07/29/2021 1318   ALT 8 05/11/2021 0750   BILITOT 0.5 07/29/2021 1318   BILITOT 0.5 05/11/2021 0750       RADIOGRAPHIC STUDIES: DG Chest 2 View  Result Date: 07/29/2021 CLINICAL DATA:  Preop for bronchoscopy with valve removal later today. Ex-smoker. Cardiomyopathy. EXAM: CHEST - 2 VIEW COMPARISON:  PET of 10 scratch a PET 07/03/2021. Chest radiograph 06/26/2021 FINDINGS: Cervical spine fixation.  Median sternotomy for CABG. Midline trachea. Right Port-A-Cath tip at superior caval/atrial junction. Right-sided endobronchial valves again identified. Mild right hemidiaphragm elevation with mild right pleural thickening. No pneumothorax. Volume loss at the right lung base. No lobar consolidation. IMPRESSION: No acute process or interval change compared to 06/26/2021. Electronically Signed   By: Abigail Miyamoto M.D.   On: 07/29/2021 13:09   LONG TERM MONITOR (3-14 DAYS)  Result Date: 07/14/2021  Normal sinus rhythm and sinus tachycardia with rare PACs and PVCs.  No AFib.  Symptoms were associated with sinus tachycardia.  Patch Wear Time:  13 days and 22 hours (2022-10-17T10:38:10-398 to 2022-10-31T09:00:44-0400) Patient had a min HR of 46 bpm, max HR of 167 bpm, and avg HR of 79 bpm. Predominant underlying rhythm was Sinus Rhythm. Isolated SVEs were rare (<1.0%), and no SVE Couplets or SVE  Triplets were present. Isolated VEs were rare (<1.0%), VE Couplets were rare (<1.0%), and no VE Triplets were present.    ASSESSMENT AND PLAN: This is a very pleasant 64 years old white male recently diagnosed with a stage IV (T1c, N1, M1 B) large cell neuroendocrine carcinoma presented with right upper lobe lung nodule in addition to right hilar adenopathy in April 2022 and the patient was found to have solitary brain metastasis in May 2022 He is status post right upper lobectomy with lymph node sampling under the care of Dr. Kipp Brood. The patient underwent systemic chemotherapy with cisplatin 80 mg/M2 on day 1 and etoposide 100 Mg/M2 on days 1, 2 and 3 every 3 weeks.  He is status post 4 cycles.  He has a rough time tolerating his adjuvant systemic chemotherapy and it was discontinued after cycle #4. He is currently on observation but continues to have pain in the right side of the chest.  He was seen by Dr. Kipp Brood and underwent resection of the chest wall nodule and the final pathology was consistent with local recurrence of his disease and the right chest wall at the site of one of the chest wall tube. I recommended for the patient to see radiation oncology for consideration of palliative radiotherapy to this area. I do not think the patient will need any systemic chemotherapy at this point but will be happy to consider that in the future if there is any other concerning signs of metastasis or recurrence that is not amenable to local treatment. He will come back for follow-up visit in around 2 months for evaluation. For the right sided chest pain, is followed by the palliative care clinic for management of his issue. For the tachycardia the secondary to his pain and also lack of hydration.  We advised the patient to increase his hydration.  He was seen by cardiology in the past. He was advised to call immediately if he has any other concerning  symptoms in the interval. The patient voices  understanding of current disease status and treatment options and is in agreement with the current care plan. All questions were answered. The patient knows to call the clinic with any problems, questions or concerns. We can certainly see the patient much sooner if necessary.   Disclaimer: This note was dictated with voice recognition software. Similar sounding words can inadvertently be transcribed and may not be corrected upon review.

## 2021-08-06 ENCOUNTER — Ambulatory Visit: Payer: Medicare Other | Admitting: Physician Assistant

## 2021-08-06 ENCOUNTER — Encounter: Payer: Self-pay | Admitting: Physician Assistant

## 2021-08-06 ENCOUNTER — Encounter: Payer: Self-pay | Admitting: Internal Medicine

## 2021-08-07 ENCOUNTER — Other Ambulatory Visit: Payer: Self-pay

## 2021-08-07 ENCOUNTER — Ambulatory Visit (INDEPENDENT_AMBULATORY_CARE_PROVIDER_SITE_OTHER): Payer: Medicare Other | Admitting: Thoracic Surgery (Cardiothoracic Vascular Surgery)

## 2021-08-07 DIAGNOSIS — C3411 Malignant neoplasm of upper lobe, right bronchus or lung: Secondary | ICD-10-CM

## 2021-08-10 ENCOUNTER — Other Ambulatory Visit: Payer: Self-pay | Admitting: Nurse Practitioner

## 2021-08-10 ENCOUNTER — Other Ambulatory Visit: Payer: Medicare Other

## 2021-08-10 ENCOUNTER — Telehealth: Payer: Self-pay

## 2021-08-10 MED ORDER — MORPHINE SULFATE ER 15 MG PO TBCR: 15.0000 mg | EXTENDED_RELEASE_TABLET | Freq: Two times a day (BID) | ORAL | 0 refills | Status: DC

## 2021-08-10 NOTE — Telephone Encounter (Signed)
Mr. Pickel called this morning explaining that his long acting pain medicine was still causing him to feel "funny" and that he didn't feel it was controlling his pain well. I notified [name], NP who changed his medication to [facility] Contin. I informed Mr. Wion of this and advised him to take his original pain medication back to the pharmacy to give to them for disposal. Understanding and gratitude verbalized. All questions/concerns addressed. Advised to call back with any question/concerns.

## 2021-08-12 ENCOUNTER — Ambulatory Visit: Payer: Medicare Other | Admitting: Physician Assistant

## 2021-08-12 NOTE — Progress Notes (Signed)
Thoracic Location of Tumor / Histology: Lung cancer with mets to chest wall.  Patient presented with complaints of right side chest pain.    MRI Brain 08/14/2021:  PET 06/30/2021: asymmetric nodular hypermetabolic activity within the posterior right chest wall near the thoracotomy site concerning for early chest wall recurrence.  Biopsies of Posterior Right Chest Wall 07/29/2021    Tobacco/Marijuana/Snuff/ETOH use: Former Smoker, quit 2017  Past/Anticipated interventions by cardiothoracic surgery, if any:  Dr. Kipp Brood 08/07/2021 -65 year old male status post endobronchial valve removal and soft tissue mass resection which shows metastatic large cell neuroendocrine tumor.   -He is scheduled to meet with our radiation oncology for further therapy of this.  He will also continue to follow-up with medical oncology.  Pain management per oncology service.  Follow-up as needed with me. - excision of chest wall mass as well as removal of the endobronchial valve 07/29/2021   Past/Anticipated interventions by medical oncology, if any:  Dr. [name] 08/05/2021 -He is status post 4 cycles.  He has a rough time tolerating his adjuvant systemic chemotherapy and it was discontinued after cycle #4. He is currently on observation but continues to have pain in the right side of the chest. -He was seen by Dr. Kipp Brood and underwent resection of the chest wall nodule and the final pathology was consistent with local recurrence of his disease and the right chest wall at the site of one of the chest wall tube. -I recommended for the patient to see radiation oncology for consideration of palliative radiotherapy to this area. -I do not think the patient will need any systemic chemotherapy at this point but will be happy to consider that in the future if there is any other concerning signs of metastasis or recurrence that is not amenable to local treatment.  Signs/Symptoms Weight changes, if any: Stable.  Notes  his appetite is finally starting to come back and he is able to eat the foods he likes. Respiratory complaints, if any: Denies SOB Hemoptysis, if any: Occasional non-productive cough. Pain issues, if any:  Headaches and neck pain.  Notes right side chest/rib pain that radiates around to his back. Other:  He reports feeling "weird", noticing more generalized mild headaches/pressure a few days a week.  He also reports some muscle twitching.  He has some lightheadedness/dizziness when he first stands.  Recovers quickly.  SAFETY ISSUES: Prior radiation? SRS to a small metastatic brain lesion completed on February 13, 2021 under the care of Dr. Lisbeth Renshaw Pacemaker/ICD?  No Possible current pregnancy? No Is the patient on methotrexate? No  Current Complaints / other details:   -[facility]

## 2021-08-13 ENCOUNTER — Ambulatory Visit
Admission: RE | Admit: 2021-08-13 | Discharge: 2021-08-13 | Disposition: A | Discharge status: Home / Self Care | Payer: Medicare Other | Source: Ambulatory Visit | Attending: Radiation Oncology | Admitting: Radiation Oncology

## 2021-08-13 ENCOUNTER — Encounter: Payer: Self-pay | Admitting: Radiation Oncology

## 2021-08-13 ENCOUNTER — Other Ambulatory Visit: Payer: Self-pay

## 2021-08-13 ENCOUNTER — Inpatient Hospital Stay: Payer: Medicare Other

## 2021-08-13 VITALS — BP 122/71 | HR 104 | Temp 96.3°F | Resp 18 | Ht 69.0 in | Wt 169.5 lb

## 2021-08-13 DIAGNOSIS — C7A1 Malignant poorly differentiated neuroendocrine tumors: Secondary | ICD-10-CM

## 2021-08-13 DIAGNOSIS — Z510 Encounter for antineoplastic radiation therapy: Secondary | ICD-10-CM | POA: Diagnosis not present

## 2021-08-13 DIAGNOSIS — C3491 Malignant neoplasm of unspecified part of right bronchus or lung: Secondary | ICD-10-CM

## 2021-08-13 DIAGNOSIS — Z923 Personal history of irradiation: Secondary | ICD-10-CM | POA: Diagnosis not present

## 2021-08-13 DIAGNOSIS — C7B8 Other secondary neuroendocrine tumors: Secondary | ICD-10-CM | POA: Diagnosis not present

## 2021-08-13 DIAGNOSIS — Z95828 Presence of other vascular implants and grafts: Secondary | ICD-10-CM

## 2021-08-13 LAB — CMP (CANCER CENTER ONLY)
ALT: 18 U/L (ref 0–44)
AST: 21 U/L (ref 15–41)
Albumin: 4 g/dL (ref 3.5–5.0)
Alkaline Phosphatase: 85 U/L (ref 38–126)
Anion gap: 9 (ref 5–15)
BUN: 17 mg/dL (ref 8–23)
CO2: 24 mmol/L (ref 22–32)
Calcium: 9.7 mg/dL (ref 8.9–10.3)
Chloride: 104 mmol/L (ref 98–111)
Creatinine: 1.17 mg/dL (ref 0.61–1.24)
GFR, Estimated: >60 mL/min (ref >60)
Glucose, Bld: 100 mg/dL — ABNORMAL HIGH (ref 70–99)
Potassium: 4.7 mmol/L (ref 3.5–5.1)
Sodium: 137 mmol/L (ref 135–145)
Total Bilirubin: 0.6 mg/dL (ref 0.3–1.2)
Total Protein: 7.3 g/dL (ref 6.5–8.1)

## 2021-08-13 LAB — CBC WITH DIFFERENTIAL (CANCER CENTER ONLY)
Abs Immature Granulocytes: 0.04 10*3/uL (ref 0.00–0.07)
Basophils Absolute: 0 10*3/uL (ref 0.0–0.1)
Basophils Relative: 0 %
Eosinophils Absolute: 0.2 10*3/uL (ref 0.0–0.5)
Eosinophils Relative: 2 %
HCT: 37.3 % — ABNORMAL LOW (ref 39.0–52.0)
Hemoglobin: 12.2 g/dL — ABNORMAL LOW (ref 13.0–17.0)
Immature Granulocytes: 0 %
Lymphocytes Relative: 12 %
Lymphs Abs: 1.3 10*3/uL (ref 0.7–4.0)
MCH: 30 pg (ref 26.0–34.0)
MCHC: 32.7 g/dL (ref 30.0–36.0)
MCV: 91.9 fL (ref 80.0–100.0)
Monocytes Absolute: 0.8 10*3/uL (ref 0.1–1.0)
Monocytes Relative: 7 %
Neutro Abs: 8.5 10*3/uL — ABNORMAL HIGH (ref 1.7–7.7)
Neutrophils Relative %: 79 %
Platelet Count: 212 10*3/uL (ref 150–400)
RBC: 4.06 MIL/uL — ABNORMAL LOW (ref 4.22–5.81)
RDW: 12.6 % (ref 11.5–15.5)
WBC Count: 10.9 10*3/uL — ABNORMAL HIGH (ref 4.0–10.5)
nRBC: 0 % (ref 0.0–0.2)

## 2021-08-13 LAB — MAGNESIUM: Magnesium: 2.1 mg/dL (ref 1.7–2.4)

## 2021-08-13 MED ORDER — SODIUM CHLORIDE 0.9% FLUSH
10.0000 mL | Freq: Once | INTRAVENOUS | Status: AC
  Administered 2021-08-13: 10 mL

## 2021-08-13 MED ORDER — HEPARIN SOD (PORK) LOCK FLUSH 100 UNIT/ML IV SOLN
500.0000 [IU] | Freq: Once | INTRAVENOUS | Status: AC
  Administered 2021-08-13: 500 [IU]

## 2021-08-14 ENCOUNTER — Encounter: Payer: Self-pay | Admitting: Radiation Oncology

## 2021-08-14 ENCOUNTER — Ambulatory Visit
Admission: RE | Admit: 2021-08-14 | Discharge: 2021-08-14 | Disposition: A | Discharge status: Home / Self Care | Payer: Medicare Other | Source: Ambulatory Visit | Attending: Radiation Oncology | Admitting: Radiation Oncology

## 2021-08-14 DIAGNOSIS — C7931 Secondary malignant neoplasm of brain: Secondary | ICD-10-CM

## 2021-08-14 MED ORDER — SODIUM CHLORIDE 0.9% FLUSH
10.0000 mL | INTRAVENOUS | Status: DC | PRN
  Administered 2021-08-14: 10 mL via INTRAVENOUS

## 2021-08-14 MED ORDER — HEPARIN SOD (PORK) LOCK FLUSH 100 UNIT/ML IV SOLN
500.0000 [IU] | Freq: Once | INTRAVENOUS | Status: AC
  Administered 2021-08-14: 500 [IU] via INTRAVENOUS

## 2021-08-14 MED ORDER — GADOBENATE DIMEGLUMINE 529 MG/ML IV SOLN
15.0000 mL | Freq: Once | INTRAVENOUS | Status: AC | PRN
  Administered 2021-08-14: 15 mL via INTRAVENOUS

## 2021-08-14 NOTE — Progress Notes (Signed)
Radiation Oncology         [phone] ________________________________  Name: [name] MRN: [id number]  Date: 08/13/2021  DOB: Jan 18, 1956  Follow-Up Visit Note  CC: [name], MD  Curt Bears, MD  Diagnosis:   Stage IV (pT1c, N1, M1b) large cell neuroendocrine carcinoma  Interval since radiation treatment :  6 months (radiosurgery to 2 subcentimeter lesions in June 2022)  Narrative:  The patient returns today for routine follow-up.  The patient is seen today for his diagnosis of stage IV large cell neuroendocrine tumor.  He is status post radiosurgery and has an upcoming MRI scan of the brain within the next several days and his case will be reviewed in conference next week.  The patient developed some chest wall pain on the right where there was some hypermetabolic activity on PET scan concerning for recurrence.  He proceeded with resection of this area with cardiothoracic surgery and pathology did return positive for metastatic large cell neuroendocrine tumor.  The margins were positive at the cauterized edge.  I have been asked to see the patient for consideration of radiation treatment to this region.  The patient overall is doing well but is having some expected discomfort in the region of his recent surgery.                              ALLERGIES:  is allergic to indomethacin, prednisone, tetanus toxoids, wellbutrin [bupropion], gabapentin, and varenicline.  Meds: Current Outpatient Medications  Medication Sig Dispense Refill   acetaminophen (TYLENOL) 325 MG tablet Take 2 tablets (650 mg total) by mouth 3 (three) times daily.     aspirin EC 81 MG tablet Take 81 mg by mouth daily. Swallow whole.     atorvastatin (LIPITOR) 80 MG tablet TAKE 1 TABLET(80 MG) BY MOUTH DAILY 90 tablet 2   docusate sodium (COLACE) 100 MG capsule Take 100 mg by mouth daily. 1-2     DULoxetine 40 MG CPEP Take 40 mg by mouth every evening. 30 capsule 1   ferrous gluconate (FERGON) 324 MG  tablet Take 1 tablet (324 mg total) by mouth daily with breakfast. 30 tablet 2   gabapentin (NEURONTIN) 100 MG capsule Take 1 capsule (100 mg total) by mouth at bedtime. 30 capsule 1   HYDROmorphone (DILAUDID) 4 MG tablet Take 1 tablet (4 mg total) by mouth every 4 (four) hours as needed for moderate pain. 90 tablet 0   lidocaine (LIDODERM) 5 % Place 1 patch onto the skin daily. Remove & Discard patch within 12 hours or as directed by MD 30 patch 0   lidocaine-prilocaine (EMLA) cream Apply 1 application topically once as needed (port access).     metoprolol succinate (TOPROL-XL) 25 MG 24 hr tablet Take 1 tablet (25 mg total) by mouth daily. (Patient taking differently: Take 25 mg by mouth every evening.) 90 tablet 3   midodrine (PROAMATINE) 2.5 MG tablet Take 1 tablet (2.5 mg total) by mouth 3 (three) times daily with meals. (Patient taking differently: Take 2.5 mg by mouth 3 (three) times daily as needed (Hypotension).) 90 tablet 5   morphine (MS CONTIN) 15 MG 12 hr tablet Take 1 tablet (15 mg total) by mouth every 12 (twelve) hours. 60 tablet 0   ondansetron (ZOFRAN) 8 MG tablet Take 1 tablet (8 mg total) by mouth every 8 (eight) hours as needed for nausea or vomiting. Starting 3 days after chemotherapy 30 tablet 2  prochlorperazine (COMPAZINE) 10 MG tablet Take 1 tablet (10 mg total) by mouth every 6 (six) hours as needed. 30 tablet 2   ranolazine (RANEXA) 1000 MG SR tablet Take 1 tablet (1,000 mg total) by mouth 2 (two) times daily. 180 tablet 3   XARELTO 20 MG TABS tablet Take 20 mg by mouth every evening.     nitroGLYCERIN (NITROSTAT) 0.4 MG SL tablet PLACE 1 TABLET UNDER THE TONGUE EVERY 5 MINUTES AS NEEDED FOR CHEST PAIN. 3 DOSES MAX (Patient not taking: Reported on 08/05/2021) 25 tablet 4   No current facility-administered medications for this encounter.   Facility-Administered Medications Ordered in Other Encounters  Medication Dose Route Frequency Provider Last Rate Last Admin   sodium  chloride flush (NS) 0.9 % injection 10 mL  10 mL Intravenous PRN Kyung Rudd, MD   10 mL at 08/14/21 1010    Physical Findings: The patient is in no acute distress. Patient is alert and oriented.  height is 5\' 9"  (1.753 m) and weight is 169 lb 8 oz (76.9 kg). His temporal temperature is 96.3 F (35.7 C) (abnormal). His blood pressure is 122/71 and his pulse is 104 (abnormal). His respiration is 18 and oxygen saturation is 96%. .     Lab Findings: Lab Results  Component Value Date   WBC 10.9 (H) 08/13/2021   HGB 12.2 (L) 08/13/2021   HCT 37.3 (L) 08/13/2021   MCV 91.9 08/13/2021   PLT 212 08/13/2021     Radiographic Findings: DG Chest 2 View  Result Date: 07/29/2021 CLINICAL DATA:  Preop for bronchoscopy with valve removal later today. Ex-smoker. Cardiomyopathy. EXAM: CHEST - 2 VIEW COMPARISON:  PET of 10 scratch a PET 07/03/2021. Chest radiograph 06/26/2021 FINDINGS: Cervical spine fixation.  Median sternotomy for CABG. Midline trachea. Right Port-A-Cath tip at superior caval/atrial junction. Right-sided endobronchial valves again identified. Mild right hemidiaphragm elevation with mild right pleural thickening. No pneumothorax. Volume loss at the right lung base. No lobar consolidation. IMPRESSION: No acute process or interval change compared to 06/26/2021. Electronically Signed   By: Abigail Miyamoto M.D.   On: 07/29/2021 13:09    Impression:    The patient is seen today for his diagnosis of stage IV large cell neuroendocrine tumor.  He overall is doing well and is status post excision of a recurrence within the right chest wall.  Margins were positive.  I did discuss a recommendation to proceed with adjuvant radiation treatment to this region to reduce the chance of local recurrence.  We discussed the rationale of this treatment as well as possible side effects and risks.  All of his questions were answered and he does wish to proceed with such a treatment.  Plan: I discussed proceeding  with a 4-week course of treatment with some degree of hypofractionation, 50 Gray in 20 fractions which should reduce the chance of local recurrence.   The patient was seen in person today in clinic.  The total time spent on the patient's visit today was 40 minutes, including chart review, direct discussion/evaluation with the patient, and coordination of care.    Jodelle Gross, M.D., Ph.D.

## 2021-08-17 ENCOUNTER — Other Ambulatory Visit: Payer: Self-pay | Admitting: Radiation Therapy

## 2021-08-17 ENCOUNTER — Telehealth: Payer: Self-pay

## 2021-08-17 ENCOUNTER — Inpatient Hospital Stay (HOSPITAL_BASED_OUTPATIENT_CLINIC_OR_DEPARTMENT_OTHER): Payer: Medicare Other | Admitting: Nurse Practitioner

## 2021-08-17 ENCOUNTER — Encounter: Payer: Self-pay | Admitting: Radiation Oncology

## 2021-08-17 ENCOUNTER — Inpatient Hospital Stay: Payer: Medicare Other

## 2021-08-17 ENCOUNTER — Telehealth: Payer: Self-pay | Admitting: Internal Medicine

## 2021-08-17 ENCOUNTER — Ambulatory Visit
Admission: RE | Admit: 2021-08-17 | Discharge: 2021-08-17 | Disposition: A | Discharge status: Home / Self Care | Payer: Medicare Other | Source: Ambulatory Visit | Attending: Radiation Oncology | Admitting: Radiation Oncology

## 2021-08-17 DIAGNOSIS — R530 Neoplastic (malignant) related fatigue: Secondary | ICD-10-CM

## 2021-08-17 DIAGNOSIS — Z7189 Other specified counseling: Secondary | ICD-10-CM

## 2021-08-17 DIAGNOSIS — C7A1 Malignant poorly differentiated neuroendocrine tumors: Secondary | ICD-10-CM

## 2021-08-17 DIAGNOSIS — N179 Acute kidney failure, unspecified: Secondary | ICD-10-CM | POA: Diagnosis not present

## 2021-08-17 DIAGNOSIS — Z515 Encounter for palliative care: Secondary | ICD-10-CM

## 2021-08-17 DIAGNOSIS — C7931 Secondary malignant neoplasm of brain: Secondary | ICD-10-CM

## 2021-08-17 DIAGNOSIS — R000 Tachycardia, unspecified: Secondary | ICD-10-CM | POA: Diagnosis not present

## 2021-08-17 DIAGNOSIS — K5900 Constipation, unspecified: Secondary | ICD-10-CM

## 2021-08-17 DIAGNOSIS — G893 Neoplasm related pain (acute) (chronic): Secondary | ICD-10-CM

## 2021-08-17 NOTE — Progress Notes (Signed)
Radiation Oncology         [phone] ________________________________  Outpatient Follow Up - Conducted via telephone due to current COVID-19 concerns for limiting patient exposure  I spoke with the patient to conduct this consult visit via telephone to spare the patient unnecessary potential exposure in the healthcare setting during the current COVID-19 pandemic. The patient was notified in advance and was offered a [facility] meeting to allow for face to face communication but unfortunately reported that they did not have the appropriate resources/technology to support such a visit and instead preferred to proceed with a telephone visit.    Name: [name]        MRN: [id number]  Date of Service: 08/17/2021 DOB: 1956/01/03  XB:[id number], Annie Main, MD  [name], MD     REFERRING PHYSICIAN: [name], MD   DIAGNOSIS: The encounter diagnosis was Secondary malignant neoplasm of brain [facility]).   HISTORY OF PRESENT ILLNESS: [name] is a 65 y.o. 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 The patient was diagnosed in April 2022 with his lung carcinoma at the time of undergoing right upper lobectomy. Final pathology showed nodal involvement of the hilar lymph node, and a primary tumor measuring 2.4 cm, margins were negative.  He started cisplatin and etoposide given the neuroendocrine features.  He underwent MRI of the brain on 01/28/2020 which revealed an enhancing 6.5 mm lesion in the left lateral cerebellum consistent with metastasis.  Given these findings, he was worked up for and proceeded with stereotactic radiosurgery [facility]) which he completed in June 2022.   He met with Dr. Lisbeth Renshaw last week for evaluation of postoperative radiotherapy to the right chest wall after undergoing surgical resection on 07/29/21. The resection was consistent with large cell neuroendocrine carcinoma with cauterized tumor at the  resection margin. He did have an MRI of the brain on 08/14/21 that showed 4 new lesions, a 7 mm left frontal vertex, 5 mm left parietal lobe, 6 mm right posterior frontal vertex, and a 3-4 mm lesion in the deep right insula. He's contacted today to discuss next steps. He is going to simulate on Wednesday this week.     PREVIOUS RADIATION THERAPY:   02/13/2021 through 02/13/2021 SRS Treatment: Site Technique Total Dose (Gy) Dose per Fx (Gy) Completed Fx Beam Energies  Brain: PTV_1CerebLt13mm 3D 20/20 20 1/1 6XFFF  Brain: PTV_2 Lt Frontal 1mm 3D 20/20 20 1/1 6XFFF     PAST MEDICAL HISTORY:  Past Medical History:  Diagnosis Date   Anemia    Anxiety    Arthritis    Basal cell carcinoma (BCC) of forehead    CAD (coronary artery disease)    a. 10/2015 ant STEMI >> LHC with 3 v CAD; oLAD tx with POBA >> emergent CABG. b. Multiple evals since that time, early graft failure of SVG-RCA by cath 03/2016. c. 2/19 PCI/DES x1 to pRCA, normal EF.   Carotid artery disease ([place])    a. 40-59% BICA 02/2018.   Depression    Dyspnea    Ectopic atrial tachycardia (HCC)    Esophageal reflux    eosinophil esophagitis   Family history of adverse reaction to anesthesia    "sister has PONV" (06/21/2017)   Former tobacco use    Gout    Hepatitis C    "treated and cured" (06/21/2017)   High cholesterol    History of blood transfusion    History of  kidney stones    Hypertension    Ischemic cardiomyopathy    a. EF 25-30% at intraop TEE 4/17  //  b. Limited Echo 5/17 - EF 45-50%, mild ant HK. c. EF 55-65% by cath 09/2017.   Migraine    "3-4/yr" (06/21/2017)   Myocardial infarction ([place]) 10/2015   Palpitations    Pneumonia    Sinus bradycardia    a. HR dropping into 40s in 02/2016 -> BB reduced.   Stroke [facility]) 10/2016   "small one; sometimes my memory/cognitive issues" (06/21/2017)   Symptomatic hypotension    a. 02/2016 ER visit -> meds reduced.   Syncope    Wears dentures    Wears glasses         PAST SURGICAL HISTORY: Past Surgical History:  Procedure Laterality Date   25 GAUGE PARS PLANA VITRECTOMY WITH 20 GAUGE MVR PORT  12/31/2020   Procedure: 25 GAUGE PARS PLANA VITRECTOMY WITH 20 GAUGE MVR PORT;  Surgeon: [name], MD;  Location: [place];  Service: Thoracic;;   ANTERIOR CERVICAL DECOMP/DISCECTOMY FUSION N/A 10/17/2018   Procedure: Anterior Cervical Decompression Fusion - Cervical seven -Thoracic one;  Surgeon: Consuella Lose, MD;  Location: [place];  Service: Neurosurgery;  Laterality: N/A;   BASAL CELL CARCINOMA EXCISION     "forehead   BIOPSY  07/20/2019   Procedure: BIOPSY;  Surgeon: Carol Ada, MD;  [facility]: [facility];  Service: Endoscopy;;   BIOPSY OF MEDIASTINAL MASS Right 07/29/2021   Procedure: RIGHT CHEST WALL MASS BIOPSY;  Surgeon: [name], MD;  Location: [place];  Service: Thoracic;  Laterality: Right;   BRONCHIAL BIOPSY  12/24/2020   Procedure: BRONCHIAL BIOPSIES;  Surgeon: Garner Nash, DO;  Location: [place] [facility];  Service: Pulmonary;;   BRONCHIAL BRUSHINGS  12/24/2020   Procedure: BRONCHIAL BRUSHINGS;  Surgeon: Garner Nash, DO;  Location: [place];  Service: Pulmonary;;   BRONCHIAL NEEDLE ASPIRATION BIOPSY  12/24/2020   Procedure: BRONCHIAL NEEDLE ASPIRATION BIOPSIES;  Surgeon: Garner Nash, DO;  Location: [place];  Service: Pulmonary;;   CARDIAC CATHETERIZATION N/A 11/28/2015   Procedure: Left Heart Cath and Coronary Angiography;  Surgeon: [name], MD;  Location: [place] [facility];  Service: Cardiovascular;  Laterality: N/A;   CARDIAC CATHETERIZATION N/A 11/28/2015   Procedure: Coronary Balloon Angioplasty;  Surgeon: [name], MD;  Location: [place] [facility];  Service: Cardiovascular;  Laterality: N/A;  ostial LAD   CARDIAC CATHETERIZATION N/A 11/28/2015   Procedure: Coronary/Graft Angiography;  Surgeon: [name], MD;  Location: [place] [facility];  Service: Cardiovascular;   Laterality: N/A;  coronaries only    CARDIAC CATHETERIZATION N/A 04/21/2016   Procedure: Left Heart Cath and Coronary Angiography;  Surgeon: Wellington Hampshire, MD;  Location: [place] [facility];  Service: Cardiovascular;  Laterality: N/A;   CARDIAC CATHETERIZATION N/A 06/14/2016   Procedure: Left Heart Cath and Cors/Grafts Angiography;  Surgeon: Lorretta Harp, MD;  Location: [place] [facility];  Service: Cardiovascular;  Laterality: N/A;   CARDIAC CATHETERIZATION N/A 09/08/2016   Procedure: Left Heart Cath and Cors/Grafts Angiography;  Surgeon: Wellington Hampshire, MD;  Location: [place] [facility];  Service: Cardiovascular;  Laterality: N/A;   CARDIAC CATHETERIZATION     CORONARY ARTERY BYPASS GRAFT N/A 11/28/2015   Procedure: CORONARY ARTERY BYPASS GRAFTING (CABG) TIMES FIVE USING LEFT INTERNAL MAMMARY ARTERY AND RIGHT GREATER SAPHENOUS,VIEN HARVEATED BY ENDOVIEN, INTRAOPPRATIVE TEE;  Surgeon: Gaye Pollack, MD;  Location: [place];  Service: Open Heart Surgery;  Laterality: N/A;   CORONARY STENT INTERVENTION N/A 10/05/2017   Procedure: CORONARY STENT INTERVENTION;  Surgeon: [name], MD;  Location: [place] [facility];  Service: Cardiovascular;  Laterality: N/A;   ESOPHAGOGASTRODUODENOSCOPY (EGD) WITH PROPOFOL N/A 07/20/2019   Procedure: ESOPHAGOGASTRODUODENOSCOPY (EGD) WITH PROPOFOL;  Surgeon: Carol Ada, MD;  [facility];  Service: Endoscopy;  Laterality: N/A;   ESOPHAGOGASTRODUODENOSCOPY (EGD) WITH PROPOFOL N/A 01/30/2021   Procedure: ESOPHAGOGASTRODUODENOSCOPY (EGD) WITH PROPOFOL;  Surgeon: Carol Ada, MD;  [facility];  Service: Endoscopy;  Laterality: N/A;   FIDUCIAL MARKER PLACEMENT  12/24/2020   Procedure: FIDUCIAL DYE MARKER PLACEMENT;  Surgeon: Garner Nash, DO;  Location: [place] [facility];  Service: Pulmonary;;   HUMERUS SURGERY Right 1969   "tumor inside bone; filled it w/bone chips"   INTERCOSTAL NERVE BLOCK Right 12/24/2020   Procedure: INTERCOSTAL  NERVE BLOCK;  Surgeon: [name], MD;  Location: [place];  Service: Thoracic;  Laterality: Right;   IR IMAGING GUIDED PORT INSERTION  02/06/2021   LEFT HEART CATH AND CORS/GRAFTS ANGIOGRAPHY N/A 03/11/2017   Procedure: Left Heart Cath and Cors/Grafts Angiography;  Surgeon: Leonie Man, MD;  Location: [place] [facility];  Service: Cardiovascular;  Laterality: N/A;   LEFT HEART CATH AND CORS/GRAFTS ANGIOGRAPHY N/A 10/05/2017   Procedure: LEFT HEART CATH AND CORS/GRAFTS ANGIOGRAPHY;  Surgeon: [name], MD;  Location: [place] [facility];  Service: Cardiovascular;  Laterality: N/A;   LEFT HEART CATH AND CORS/GRAFTS ANGIOGRAPHY N/A 04/11/2019   Procedure: LEFT HEART CATH AND CORS/GRAFTS ANGIOGRAPHY;  Surgeon: [name], MD;  Location: [place] [facility];  Service: Cardiovascular;  Laterality: N/A;   NODE DISSECTION Right 12/24/2020   Procedure: NODE DISSECTION;  Surgeon: [name], MD;  Location: [place];  Service: Thoracic;  Laterality: Right;   PERIPHERAL VASCULAR CATHETERIZATION N/A 06/14/2016   Procedure: Lower Extremity Angiography;  Surgeon: Lorretta Harp, MD;  Location: [place] [facility];  Service: Cardiovascular;  Laterality: N/A;   VIDEO BRONCHOSCOPY WITH ENDOBRONCHIAL NAVIGATION Right 12/24/2020   Procedure: VIDEO BRONCHOSCOPY WITH ENDOBRONCHIAL NAVIGATION;  Surgeon: Garner Nash, DO;  Location: [place];  Service: Pulmonary;  Laterality: Right;   VIDEO BRONCHOSCOPY WITH ENDOBRONCHIAL ULTRASOUND N/A 12/24/2020   Procedure: VIDEO BRONCHOSCOPY WITH ENDOBRONCHIAL ULTRASOUND;  Surgeon: Garner Nash, DO;  Location: [place];  Service: Pulmonary;  Laterality: N/A;   VIDEO BRONCHOSCOPY WITH INSERTION OF INTERBRONCHIAL VALVE (IBV) N/A 12/31/2020   Procedure: VIDEO BRONCHOSCOPY WITH INSERTION OF INTERBRONCHIAL VALVE (IBV).VALVE IN CARTRIDGE 77m,9mm. CHEST TUBE PLACEMENT.;  Surgeon: LLajuana Matte MD;  [facility];  Service: Thoracic;   Laterality: N/A;   VIDEO BRONCHOSCOPY WITH INSERTION OF INTERBRONCHIAL VALVE (IBV) N/A 07/29/2021   Procedure: VIDEO BRONCHOSCOPY WITH REMOVAL OF INTERBRONCHIAL VALVE (IBV);  Surgeon: LLajuana Matte MD;  [facility];  Service: Thoracic;  Laterality: N/A;     FAMILY HISTORY:  Family History  Problem Relation Age of Onset   Lung cancer Mother    Heart Problems Father    Heart attack Father 746  Stroke Father    Heart failure Father    Heart attack Maternal Grandmother    Stroke Maternal Grandmother    Heart attack Paternal Uncle    Hypertension Brother    Autoimmune disease Neg Hx      SOCIAL HISTORY:  reports that he quit smoking about 5 years ago. His smoking use included cigarettes. He has a 33.00  pack-year smoking history. He has never used smokeless tobacco. He reports that he does not currently use alcohol. He reports that he does not use drugs.  The patient is married and lives in [place].  He owns a [employer].   ALLERGIES: Indomethacin, Prednisone, Tetanus toxoids, Wellbutrin [bupropion], Gabapentin, and Varenicline   MEDICATIONS:  Current Outpatient Medications  Medication Sig Dispense Refill   acetaminophen (TYLENOL) 325 MG tablet Take 2 tablets (650 mg total) by mouth 3 (three) times daily.     aspirin EC 81 MG tablet Take 81 mg by mouth daily. Swallow whole.     atorvastatin (LIPITOR) 80 MG tablet TAKE 1 TABLET(80 MG) BY MOUTH DAILY 90 tablet 2   docusate sodium (COLACE) 100 MG capsule Take 100 mg by mouth daily. 1-2     DULoxetine 40 MG CPEP Take 40 mg by mouth every evening. 30 capsule 1   ferrous gluconate (FERGON) 324 MG tablet Take 1 tablet (324 mg total) by mouth daily with breakfast. 30 tablet 2   gabapentin (NEURONTIN) 100 MG capsule Take 1 capsule (100 mg total) by mouth at bedtime. 30 capsule 1   HYDROmorphone (DILAUDID) 4 MG tablet Take 1 tablet (4 mg total) by mouth every 4 (four) hours as needed for moderate pain. 90 tablet 0    lidocaine (LIDODERM) 5 % Place 1 patch onto the skin daily. Remove & Discard patch within 12 hours or as directed by MD 30 patch 0   lidocaine-prilocaine (EMLA) cream Apply 1 application topically once as needed (port access).     metoprolol succinate (TOPROL-XL) 25 MG 24 hr tablet Take 1 tablet (25 mg total) by mouth daily. (Patient taking differently: Take 25 mg by mouth every evening.) 90 tablet 3   midodrine (PROAMATINE) 2.5 MG tablet Take 1 tablet (2.5 mg total) by mouth 3 (three) times daily with meals. (Patient taking differently: Take 2.5 mg by mouth 3 (three) times daily as needed (Hypotension).) 90 tablet 5   morphine (MS CONTIN) 15 MG 12 hr tablet Take 1 tablet (15 mg total) by mouth every 12 (twelve) hours. 60 tablet 0   nitroGLYCERIN (NITROSTAT) 0.4 MG SL tablet PLACE 1 TABLET UNDER THE TONGUE EVERY 5 MINUTES AS NEEDED FOR CHEST PAIN. 3 DOSES MAX (Patient not taking: Reported on 08/05/2021) 25 tablet 4   ondansetron (ZOFRAN) 8 MG tablet Take 1 tablet (8 mg total) by mouth every 8 (eight) hours as needed for nausea or vomiting. Starting 3 days after chemotherapy 30 tablet 2   prochlorperazine (COMPAZINE) 10 MG tablet Take 1 tablet (10 mg total) by mouth every 6 (six) hours as needed. 30 tablet 2   ranolazine (RANEXA) 1000 MG SR tablet Take 1 tablet (1,000 mg total) by mouth 2 (two) times daily. 180 tablet 3   XARELTO 20 MG TABS tablet Take 20 mg by mouth every evening.     No current facility-administered medications for this encounter.     REVIEW OF SYSTEMS: On review of systems, the patient reports that he is doing okay. He has some multifocal headaches in the middle of the night or early morning. Sometimes it's in the morning when he has this. Tylenol takes the edge off, but his headaches predate surgery and prior to taking morphine for his postop chest wall pain. He reports these do not appear to be related to nausea or light sensitivity. He reports numbness from his right elbow to the  middle of his right pinky finger. This is constant, and he  has also noticed frequent episodes of jerking of his right lower extremity at night. No loss of consciousness has occurred but he's worried if this is posibbly a seizure. He has also recently had respiratory complaints of cough, and nausea and vomiting related to having clinical diagnosis of flu. His wife tested positive for flu  and while he did not test for this, he has had the same symptoms. No other complaints are verbalized.   PHYSICAL EXAM:  Unable to assess given encounter type.   ECOG = 1  0 - Asymptomatic (Fully active, able to carry on all predisease activities without restriction)  1 - Symptomatic but completely ambulatory (Restricted in physically strenuous activity but ambulatory and able to carry out work of a light or sedentary nature. For example, light housework, office work)  2 - Symptomatic, <50% in bed during the day (Ambulatory and capable of all self care but unable to carry out any work activities. Up and about more than 50% of waking hours)  3 - Symptomatic, >50% in bed, but not bedbound (Capable of only limited self-care, confined to bed or chair 50% or more of waking hours)  4 - Bedbound (Completely disabled. Cannot carry on any self-care. Totally confined to bed or chair)  5 - Death   Eustace Pen MM, Creech RH, Tormey DC, et al. [phone]). "Toxicity and response criteria of the [facility]". [place] Oncol. 5 (6): 649-55    LABORATORY DATA:  Lab Results  Component Value Date   WBC 10.9 (H) 08/13/2021   HGB 12.2 (L) 08/13/2021   HCT 37.3 (L) 08/13/2021   MCV 91.9 08/13/2021   PLT 212 08/13/2021   Lab Results  Component Value Date   NA 137 08/13/2021   K 4.7 08/13/2021   CL 104 08/13/2021   CO2 24 08/13/2021   Lab Results  Component Value Date   ALT 18 08/13/2021   AST 21 08/13/2021   ALKPHOS 85 08/13/2021   BILITOT 0.6 08/13/2021      RADIOGRAPHY: DG Chest 2  View  Result Date: 07/29/2021 CLINICAL DATA:  Preop for bronchoscopy with valve removal later today. Ex-smoker. Cardiomyopathy. EXAM: CHEST - 2 VIEW COMPARISON:  PET of 10 scratch a PET 07/03/2021. Chest radiograph 06/26/2021 FINDINGS: Cervical spine fixation.  Median sternotomy for CABG. Midline trachea. Right Port-A-Cath tip at superior caval/atrial junction. Right-sided endobronchial valves again identified. Mild right hemidiaphragm elevation with mild right pleural thickening. No pneumothorax. Volume loss at the right lung base. No lobar consolidation. IMPRESSION: No acute process or interval change compared to 06/26/2021. Electronically Signed   By: Abigail Miyamoto M.D.   On: 07/29/2021 13:09   MR Brain W Wo Contrast  Result Date: 08/15/2021 CLINICAL DATA:  Follow-up S RS treated brain mass. Metastatic lung cancer. EXAM: MRI HEAD WITHOUT AND WITH CONTRAST TECHNIQUE: Multiplanar, multiecho pulse sequences of the brain and surrounding structures were obtained without and with intravenous contrast. CONTRAST:  79m MULTIHANCE GADOBENATE DIMEGLUMINE 529 MG/ML IV SOLN COMPARISON:  05/15/2021.  02/04/2021. FINDINGS: Brain: Diffusion imaging does not show any acute or subacute infarction or other cause of restricted diffusion. No abnormality affects the brainstem. Focus of hemosiderin deposition with punctate enhancement in the left cerebellum is unchanged since the prior study, consistent with an appearance suggesting successful treatment of the metastasis seen in June of this year. Punctate metastasis previously seen at the left frontoparietal vertex is no longer visible, as on the immediate prior exam. 7 mm in diameter new enhancing  metastasis at left frontal vertex, axial image 132. 5 mm in diameter new enhancing metastasis in the left parietal lobe axial image 120. 6 mm in diameter new enhancing metastasis at the right posterior frontal vertex axial image 133. 3-4 mm in diameter new enhancing metastasis along  the surface of the brain in the deep insula on the right axial image 72. No hydrocephalus or extra-axial collection. Vascular: Major vessels at the base of the brain show flow. Skull and upper cervical spine: Negative Sinuses/Orbits: Small chronic cyst at the superolateral corner of the left maxillary sinus as seen previously orbits negative. Other: None IMPRESSION: 4 new metastases identified as enumerated above, in the left frontal vertex, left parietal lobe, right posterior frontal vertex and right deep insula. Continued good appearance of the previously treated left cerebellar lesion. No visible residua with respect to a punctate infarction previously seen at the left frontoparietal vertex. Electronically Signed   By: Nelson Chimes M.D.   On: 08/15/2021 16:03        IMPRESSION/PLAN: 1. 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 We reviewed the findings from his MRI and discussion from brain oncology conference. These 4 lesions should be treated with SRS. We discussed the risks, benefits, short, and long term effects of radiotherapy, as well as the curative intent, and the patient is interested in proceeding. We will reach out to Dr. Garald Braver office to coordinate treatment times and we will work on then coordinating simulation times. He will otherwise be followed by Dr. [name] in surveillance but systemic therapy is felt to be an option if further metastatic disease presented.  2. Neurologic symptoms of headaches, numbness, and uncontrolled RLE jerking movements. We offered referral to Dr. [name] for evaluation. He is interested and would like to be seen as soon as he can to rule out other concerns for neurologic compromise.     Given current concerns for patient exposure during the COVID-19 pandemic, this encounter was conducted via telephone.  The patient has provided two factor identification and has given verbal consent for this type of encounter  and has been advised to only accept a meeting of this type in a secure network environment. The time spent during this encounter was 45 minutes including preparation, discussion, and coordination of the patient's care. The attendants for this meeting include  Hayden Pedro  and [name] and [name].  During the encounter,   Hayden Pedro was located at [facility] Radiation Oncology Department.  [name] was located at home with [name].        Carola Rhine, [facility]   **Disclaimer: This note was dictated with voice recognition software. Similar sounding words can inadvertently be transcribed and this note may contain transcription errors which may not have been corrected upon publication of note.**

## 2021-08-17 NOTE — Telephone Encounter (Signed)
Scheduled appt per 12/19 referral. Pt is aware of appt date and time.

## 2021-08-17 NOTE — Telephone Encounter (Signed)
Mr. Bollier called to inform our office that he currently has the flu. We changed his visit to a phone visit instead. He also stated that his pain medication was still not providing relief. I explained that we could discuss this with [name], NP during the telephone visit and would also plan to see him face to face in the near future. Understanding and agreeance verbalized. All questions answered.

## 2021-08-17 NOTE — Progress Notes (Addendum)
Patient reports headaches 3/10 and nausea/ vomiting (improving). No other symptoms reported at this time. Meaningful use complete.  Patient notified of 2:30pm-08/17/21 TELEPHONE only appointment w/ Shona Simpson PA-C and verbalized understanding.  Patient preferred contact # [phone].

## 2021-08-18 ENCOUNTER — Encounter: Payer: Self-pay | Admitting: Physician Assistant

## 2021-08-18 ENCOUNTER — Emergency Department (HOSPITAL_COMMUNITY): Payer: Medicare Other

## 2021-08-18 ENCOUNTER — Encounter: Payer: Self-pay | Admitting: Internal Medicine

## 2021-08-18 ENCOUNTER — Inpatient Hospital Stay (HOSPITAL_COMMUNITY)
Admission: EM | Admit: 2021-08-18 | Discharge: 2021-08-23 | DRG: 683 | Disposition: A | Discharge status: Home / Self Care | Payer: Medicare Other | Attending: Family Medicine | Admitting: Family Medicine

## 2021-08-18 ENCOUNTER — Other Ambulatory Visit: Payer: Self-pay

## 2021-08-18 DIAGNOSIS — M5412 Radiculopathy, cervical region: Secondary | ICD-10-CM | POA: Diagnosis present

## 2021-08-18 DIAGNOSIS — Z8619 Personal history of other infectious and parasitic diseases: Secondary | ICD-10-CM

## 2021-08-18 DIAGNOSIS — I471 Supraventricular tachycardia: Secondary | ICD-10-CM | POA: Diagnosis present

## 2021-08-18 DIAGNOSIS — I480 Paroxysmal atrial fibrillation: Secondary | ICD-10-CM | POA: Diagnosis present

## 2021-08-18 DIAGNOSIS — M109 Gout, unspecified: Secondary | ICD-10-CM | POA: Diagnosis present

## 2021-08-18 DIAGNOSIS — Z7982 Long term (current) use of aspirin: Secondary | ICD-10-CM

## 2021-08-18 DIAGNOSIS — I255 Ischemic cardiomyopathy: Secondary | ICD-10-CM | POA: Diagnosis present

## 2021-08-18 DIAGNOSIS — Z902 Acquired absence of lung [part of]: Secondary | ICD-10-CM

## 2021-08-18 DIAGNOSIS — Z86711 Personal history of pulmonary embolism: Secondary | ICD-10-CM

## 2021-08-18 DIAGNOSIS — M1990 Unspecified osteoarthritis, unspecified site: Secondary | ICD-10-CM | POA: Diagnosis present

## 2021-08-18 DIAGNOSIS — N179 Acute kidney failure, unspecified: Secondary | ICD-10-CM | POA: Diagnosis not present

## 2021-08-18 DIAGNOSIS — Z801 Family history of malignant neoplasm of trachea, bronchus and lung: Secondary | ICD-10-CM

## 2021-08-18 DIAGNOSIS — E440 Moderate protein-calorie malnutrition: Secondary | ICD-10-CM | POA: Diagnosis present

## 2021-08-18 DIAGNOSIS — E7800 Pure hypercholesterolemia, unspecified: Secondary | ICD-10-CM | POA: Diagnosis present

## 2021-08-18 DIAGNOSIS — G8929 Other chronic pain: Secondary | ICD-10-CM

## 2021-08-18 DIAGNOSIS — Z85828 Personal history of other malignant neoplasm of skin: Secondary | ICD-10-CM

## 2021-08-18 DIAGNOSIS — Z87891 Personal history of nicotine dependence: Secondary | ICD-10-CM

## 2021-08-18 DIAGNOSIS — Z79899 Other long term (current) drug therapy: Secondary | ICD-10-CM

## 2021-08-18 DIAGNOSIS — I252 Old myocardial infarction: Secondary | ICD-10-CM

## 2021-08-18 DIAGNOSIS — C7931 Secondary malignant neoplasm of brain: Secondary | ICD-10-CM | POA: Diagnosis present

## 2021-08-18 DIAGNOSIS — C7951 Secondary malignant neoplasm of bone: Secondary | ICD-10-CM | POA: Diagnosis present

## 2021-08-18 DIAGNOSIS — F32A Depression, unspecified: Secondary | ICD-10-CM

## 2021-08-18 DIAGNOSIS — C3491 Malignant neoplasm of unspecified part of right bronchus or lung: Secondary | ICD-10-CM

## 2021-08-18 DIAGNOSIS — G894 Chronic pain syndrome: Secondary | ICD-10-CM | POA: Diagnosis present

## 2021-08-18 DIAGNOSIS — F419 Anxiety disorder, unspecified: Secondary | ICD-10-CM | POA: Diagnosis present

## 2021-08-18 DIAGNOSIS — I2510 Atherosclerotic heart disease of native coronary artery without angina pectoris: Secondary | ICD-10-CM | POA: Diagnosis present

## 2021-08-18 DIAGNOSIS — F3160 Bipolar disorder, current episode mixed, unspecified: Secondary | ICD-10-CM | POA: Diagnosis present

## 2021-08-18 DIAGNOSIS — Z972 Presence of dental prosthetic device (complete) (partial): Secondary | ICD-10-CM

## 2021-08-18 DIAGNOSIS — I25810 Atherosclerosis of coronary artery bypass graft(s) without angina pectoris: Secondary | ICD-10-CM | POA: Diagnosis present

## 2021-08-18 DIAGNOSIS — Z7901 Long term (current) use of anticoagulants: Secondary | ICD-10-CM

## 2021-08-18 DIAGNOSIS — C3490 Malignant neoplasm of unspecified part of unspecified bronchus or lung: Secondary | ICD-10-CM | POA: Diagnosis present

## 2021-08-18 DIAGNOSIS — I129 Hypertensive chronic kidney disease with stage 1 through stage 4 chronic kidney disease, or unspecified chronic kidney disease: Secondary | ICD-10-CM | POA: Diagnosis present

## 2021-08-18 DIAGNOSIS — C7A8 Other malignant neuroendocrine tumors: Secondary | ICD-10-CM | POA: Diagnosis present

## 2021-08-18 DIAGNOSIS — R000 Tachycardia, unspecified: Secondary | ICD-10-CM

## 2021-08-18 DIAGNOSIS — Z951 Presence of aortocoronary bypass graft: Secondary | ICD-10-CM

## 2021-08-18 DIAGNOSIS — Z8701 Personal history of pneumonia (recurrent): Secondary | ICD-10-CM

## 2021-08-18 DIAGNOSIS — Z955 Presence of coronary angioplasty implant and graft: Secondary | ICD-10-CM

## 2021-08-18 DIAGNOSIS — Z8249 Family history of ischemic heart disease and other diseases of the circulatory system: Secondary | ICD-10-CM

## 2021-08-18 DIAGNOSIS — M797 Fibromyalgia: Secondary | ICD-10-CM | POA: Diagnosis present

## 2021-08-18 DIAGNOSIS — F319 Bipolar disorder, unspecified: Secondary | ICD-10-CM | POA: Diagnosis present

## 2021-08-18 DIAGNOSIS — E860 Dehydration: Secondary | ICD-10-CM | POA: Diagnosis present

## 2021-08-18 DIAGNOSIS — Z8673 Personal history of transient ischemic attack (TIA), and cerebral infarction without residual deficits: Secondary | ICD-10-CM

## 2021-08-18 DIAGNOSIS — Z20822 Contact with and (suspected) exposure to covid-19: Secondary | ICD-10-CM | POA: Diagnosis present

## 2021-08-18 DIAGNOSIS — Z87442 Personal history of urinary calculi: Secondary | ICD-10-CM

## 2021-08-18 DIAGNOSIS — N1831 Chronic kidney disease, stage 3a: Secondary | ICD-10-CM | POA: Diagnosis present

## 2021-08-18 DIAGNOSIS — Z823 Family history of stroke: Secondary | ICD-10-CM

## 2021-08-18 DIAGNOSIS — R079 Chest pain, unspecified: Secondary | ICD-10-CM

## 2021-08-18 LAB — COMPREHENSIVE METABOLIC PANEL
ALT: 26 U/L (ref 0–44)
AST: 21 U/L (ref 15–41)
Albumin: 4.3 g/dL (ref 3.5–5.0)
Alkaline Phosphatase: 79 U/L (ref 38–126)
Anion gap: 10 (ref 5–15)
BUN: 29 mg/dL — ABNORMAL HIGH (ref 8–23)
CO2: 25 mmol/L (ref 22–32)
Calcium: 10.3 mg/dL (ref 8.9–10.3)
Chloride: 100 mmol/L (ref 98–111)
Creatinine, Ser: 1.36 mg/dL — ABNORMAL HIGH (ref 0.61–1.24)
GFR, Estimated: 57 mL/min — ABNORMAL LOW (ref >60)
Glucose, Bld: 118 mg/dL — ABNORMAL HIGH (ref 70–99)
Potassium: 4 mmol/L (ref 3.5–5.1)
Sodium: 135 mmol/L (ref 135–145)
Total Bilirubin: 0.7 mg/dL (ref 0.3–1.2)
Total Protein: 8 g/dL (ref 6.5–8.1)

## 2021-08-18 LAB — RESP PANEL BY RT-PCR (FLU A&B, COVID) ARPGX2
Influenza A by PCR: NEGATIVE
Influenza B by PCR: NEGATIVE
SARS Coronavirus 2 by RT PCR: NEGATIVE

## 2021-08-18 LAB — CBC WITH DIFFERENTIAL/PLATELET
Abs Immature Granulocytes: 0.03 10*3/uL (ref 0.00–0.07)
Basophils Absolute: 0 10*3/uL (ref 0.0–0.1)
Basophils Relative: 0 %
Eosinophils Absolute: 0 10*3/uL (ref 0.0–0.5)
Eosinophils Relative: 0 %
HCT: 42.4 % (ref 39.0–52.0)
Hemoglobin: 13.8 g/dL (ref 13.0–17.0)
Immature Granulocytes: 0 %
Lymphocytes Relative: 18 %
Lymphs Abs: 1.6 10*3/uL (ref 0.7–4.0)
MCH: 29.9 pg (ref 26.0–34.0)
MCHC: 32.5 g/dL (ref 30.0–36.0)
MCV: 91.8 fL (ref 80.0–100.0)
Monocytes Absolute: 0.9 10*3/uL (ref 0.1–1.0)
Monocytes Relative: 10 %
Neutro Abs: 6.3 10*3/uL (ref 1.7–7.7)
Neutrophils Relative %: 72 %
Platelets: 214 10*3/uL (ref 150–400)
RBC: 4.62 MIL/uL (ref 4.22–5.81)
RDW: 12.6 % (ref 11.5–15.5)
WBC: 8.9 10*3/uL (ref 4.0–10.5)
nRBC: 0 % (ref 0.0–0.2)

## 2021-08-18 LAB — D-DIMER, QUANTITATIVE: D-Dimer, Quant: 0.65 ug/mL-FEU — ABNORMAL HIGH (ref 0.00–0.50)

## 2021-08-18 LAB — TROPONIN I (HIGH SENSITIVITY): Troponin I (High Sensitivity): 6 ng/L (ref <18)

## 2021-08-18 LAB — MAGNESIUM: Magnesium: 2.3 mg/dL (ref 1.7–2.4)

## 2021-08-18 LAB — APTT: aPTT: 39 seconds — ABNORMAL HIGH (ref 24–36)

## 2021-08-18 LAB — LACTIC ACID, PLASMA
Lactic Acid, Venous: 2.5 mmol/L (ref 0.5–1.9)
Lactic Acid, Venous: 2.1 mmol/L (ref 0.5–1.9)

## 2021-08-18 LAB — PROTIME-INR
INR: 1 (ref 0.8–1.2)
Prothrombin Time: 12.7 seconds (ref 11.4–15.2)

## 2021-08-18 MED ORDER — ONDANSETRON HCL 4 MG/2ML IJ SOLN
4.0000 mg | Freq: Once | INTRAMUSCULAR | Status: AC
  Administered 2021-08-18: 15:00:00 4 mg via INTRAVENOUS
  Filled 2021-08-18: qty 2

## 2021-08-18 MED ORDER — ASPIRIN EC 81 MG PO TBEC
81.0000 mg | DELAYED_RELEASE_TABLET | Freq: Every day | ORAL | Status: DC
  Administered 2021-08-19 – 2021-08-23 (×5): 81 mg via ORAL
  Filled 2021-08-18 (×5): qty 1

## 2021-08-18 MED ORDER — SODIUM CHLORIDE 0.9 % IV BOLUS
1000.0000 mL | Freq: Once | INTRAVENOUS | Status: AC
  Administered 2021-08-18: 14:00:00 1000 mL via INTRAVENOUS

## 2021-08-18 MED ORDER — GABAPENTIN 100 MG PO CAPS
100.0000 mg | ORAL_CAPSULE | Freq: Every day | ORAL | Status: DC
  Administered 2021-08-18 – 2021-08-22 (×5): 100 mg via ORAL
  Filled 2021-08-18 (×5): qty 1

## 2021-08-18 MED ORDER — METOPROLOL SUCCINATE ER 25 MG PO TB24
25.0000 mg | ORAL_TABLET | Freq: Every evening | ORAL | Status: DC
  Administered 2021-08-18 – 2021-08-22 (×5): 25 mg via ORAL
  Filled 2021-08-18 (×5): qty 1

## 2021-08-18 MED ORDER — IOHEXOL 350 MG/ML SOLN
80.0000 mL | Freq: Once | INTRAVENOUS | Status: AC | PRN
  Administered 2021-08-18: 19:00:00 80 mL via INTRAVENOUS

## 2021-08-18 MED ORDER — HYDROMORPHONE HCL 2 MG PO TABS
4.0000 mg | ORAL_TABLET | Freq: Once | ORAL | Status: DC
  Filled 2021-08-18: qty 2

## 2021-08-18 MED ORDER — LIDOCAINE 5 % EX PTCH: 1.0000 | MEDICATED_PATCH | CUTANEOUS | Status: DC

## 2021-08-18 MED ORDER — ACETAMINOPHEN 325 MG PO TABS
650.0000 mg | ORAL_TABLET | Freq: Once | ORAL | Status: AC
  Administered 2021-08-18: 13:00:00 650 mg via ORAL
  Filled 2021-08-18: qty 2

## 2021-08-18 MED ORDER — HYDROMORPHONE HCL 1 MG/ML IJ SOLN: 0.5000 mg | INTRAMUSCULAR | Status: DC | PRN

## 2021-08-18 MED ORDER — ACETAMINOPHEN 325 MG PO TABS
650.0000 mg | ORAL_TABLET | Freq: Four times a day (QID) | ORAL | Status: DC | PRN
  Administered 2021-08-20 – 2021-08-22 (×4): 650 mg via ORAL
  Filled 2021-08-18 (×4): qty 2

## 2021-08-18 MED ORDER — HYDROMORPHONE HCL 1 MG/ML IJ SOLN
1.0000 mg | Freq: Once | INTRAMUSCULAR | Status: AC
  Administered 2021-08-18: 14:00:00 1 mg via INTRAVENOUS
  Filled 2021-08-18: qty 1

## 2021-08-18 MED ORDER — RANOLAZINE ER 500 MG PO TB12: 1000.0000 mg | ORAL_TABLET | Freq: Two times a day (BID) | ORAL | Status: DC

## 2021-08-18 MED ORDER — MIDODRINE HCL 5 MG PO TABS
2.5000 mg | ORAL_TABLET | Freq: Three times a day (TID) | ORAL | Status: DC | PRN
  Administered 2021-08-21 – 2021-08-23 (×5): 2.5 mg via ORAL
  Filled 2021-08-18 (×8): qty 1

## 2021-08-18 MED ORDER — RIVAROXABAN 20 MG PO TABS
20.0000 mg | ORAL_TABLET | Freq: Every day | ORAL | Status: DC
  Administered 2021-08-18 – 2021-08-22 (×5): 20 mg via ORAL
  Filled 2021-08-18 (×5): qty 1

## 2021-08-18 MED ORDER — HYDROMORPHONE HCL 1 MG/ML IJ SOLN
0.5000 mg | INTRAMUSCULAR | Status: AC | PRN
  Administered 2021-08-18 – 2021-08-19 (×3): 1 mg via INTRAVENOUS
  Filled 2021-08-18 (×3): qty 1

## 2021-08-18 MED ORDER — ACETAMINOPHEN 650 MG RE SUPP: 650.0000 mg | Freq: Four times a day (QID) | RECTAL | Status: DC | PRN

## 2021-08-18 MED ORDER — LIDOCAINE 5 % EX PTCH
1.0000 | MEDICATED_PATCH | CUTANEOUS | Status: DC
  Administered 2021-08-18 – 2021-08-22 (×4): 1 via TRANSDERMAL
  Filled 2021-08-18 (×5): qty 1

## 2021-08-18 MED ORDER — SODIUM CHLORIDE 0.9 % IV SOLN: INTRAVENOUS | Status: AC

## 2021-08-18 MED ORDER — DULOXETINE HCL 40 MG PO CPEP: 40.0000 mg | ORAL_CAPSULE | Freq: Every evening | ORAL | Status: DC

## 2021-08-18 MED ORDER — ATORVASTATIN CALCIUM 40 MG PO TABS
80.0000 mg | ORAL_TABLET | Freq: Every day | ORAL | Status: DC
  Administered 2021-08-19 – 2021-08-23 (×5): 80 mg via ORAL
  Filled 2021-08-18 (×5): qty 2

## 2021-08-18 MED ORDER — HYDROMORPHONE HCL 1 MG/ML IJ SOLN
1.0000 mg | Freq: Once | INTRAMUSCULAR | Status: AC
  Administered 2021-08-18: 13:00:00 1 mg via INTRAVENOUS
  Filled 2021-08-18: qty 1

## 2021-08-18 MED ORDER — HYDROMORPHONE HCL 2 MG PO TABS
4.0000 mg | ORAL_TABLET | ORAL | Status: DC | PRN
  Administered 2021-08-19 – 2021-08-22 (×13): 4 mg via ORAL
  Filled 2021-08-18 (×14): qty 2

## 2021-08-18 MED ORDER — MORPHINE SULFATE ER 15 MG PO TBCR
15.0000 mg | EXTENDED_RELEASE_TABLET | Freq: Two times a day (BID) | ORAL | Status: DC
  Administered 2021-08-18 – 2021-08-23 (×10): 15 mg via ORAL
  Filled 2021-08-18 (×10): qty 1

## 2021-08-18 MED ORDER — SODIUM CHLORIDE 0.9 % IV BOLUS
1000.0000 mL | Freq: Once | INTRAVENOUS | Status: AC
  Administered 2021-08-18: 13:00:00 1000 mL via INTRAVENOUS

## 2021-08-18 MED ORDER — DULOXETINE HCL 20 MG PO CPEP
40.0000 mg | ORAL_CAPSULE | Freq: Every day | ORAL | Status: DC
  Administered 2021-08-19 – 2021-08-22 (×4): 40 mg via ORAL
  Filled 2021-08-18 (×5): qty 2

## 2021-08-18 MED ORDER — SODIUM CHLORIDE 0.9% FLUSH
3.0000 mL | Freq: Two times a day (BID) | INTRAVENOUS | Status: DC
  Administered 2021-08-18 – 2021-08-23 (×9): 3 mL via INTRAVENOUS

## 2021-08-18 MED ORDER — RANOLAZINE ER 500 MG PO TB12
1000.0000 mg | ORAL_TABLET | Freq: Two times a day (BID) | ORAL | Status: DC
  Administered 2021-08-18 – 2021-08-23 (×10): 1000 mg via ORAL
  Filled 2021-08-18 (×10): qty 2

## 2021-08-18 MED ORDER — POLYETHYLENE GLYCOL 3350 17 G PO PACK: 17.0000 g | PACK | Freq: Every day | ORAL | Status: DC | PRN

## 2021-08-18 MED ORDER — MIDODRINE HCL 2.5 MG PO TABS
2.5000 mg | ORAL_TABLET | Freq: Three times a day (TID) | ORAL | Status: DC
  Filled 2021-08-18: qty 1

## 2021-08-18 MED ORDER — ONDANSETRON HCL 4 MG/2ML IJ SOLN
4.0000 mg | Freq: Once | INTRAMUSCULAR | Status: AC | PRN
  Administered 2021-08-18: 13:00:00 4 mg via INTRAVENOUS
  Filled 2021-08-18: qty 2

## 2021-08-18 MED ORDER — HYDROMORPHONE HCL 1 MG/ML IJ SOLN
1.0000 mg | Freq: Once | INTRAMUSCULAR | Status: AC
  Administered 2021-08-18: 15:00:00 1 mg via INTRAVENOUS
  Filled 2021-08-18: qty 1

## 2021-08-18 MED ORDER — HYDROMORPHONE HCL 1 MG/ML IJ SOLN
1.0000 mg | INTRAMUSCULAR | Status: DC | PRN
  Administered 2021-08-18: 19:00:00 1 mg via INTRAVENOUS
  Filled 2021-08-18: qty 1

## 2021-08-18 MED ORDER — ONDANSETRON HCL 4 MG PO TABS
8.0000 mg | ORAL_TABLET | Freq: Three times a day (TID) | ORAL | Status: DC | PRN
  Administered 2021-08-19 – 2021-08-23 (×4): 8 mg via ORAL
  Filled 2021-08-18 (×4): qty 2

## 2021-08-18 NOTE — ED Provider Notes (Signed)
Care of the patient assumed at the change of shift. History of lung cancer, on Xarelto, here with vomiting and elevated HR. Family has had influenza but he tested neg. He was tachycardic, but not hypoxic with walking. Getting IVF, pain meds and recheck ability to ambulate.  Physical Exam  BP 115/72    Pulse (!) 140    Temp 99.7 F (37.6 C) (Oral)    Resp 15    Ht 5\' 9"  (1.753 m)    Wt 70.3 kg    SpO2 93%    BMI 22.89 kg/m   Physical Exam  ED Course/Procedures   Clinical Course as of 08/18/21 1912  Tue Aug 18, 2021  1543 Lactic acid is improving. Resting HR is better.  [CS]  1610 On re-attempt at ambulation, patient's SpO2 down to 93% but HR jumps to 140s, still having significant pain. Will send for CTA given his continued symptoms and plan admission for further evaluation.  [CS]  1911 Spoke with Dr. Trilby Drummer, Hospitalist, who will evaluate the patient for admission.  [CS]    Clinical Course User Index [CS] Truddie Hidden, MD    Procedures  MDM       Truddie Hidden, MD 08/18/21 [phone]

## 2021-08-18 NOTE — Progress Notes (Signed)
[facility]  [phone] [phone]   Name: [name] Date: 08/18/2021 MRN: [id number]  DOB: 1955/09/19  Patient Care Team: [name], MD as PCP - General (Family Medicine) [name], MD as PCP - Cardiology (Cardiology) Garner Nash, DO as Consulting Physician (Pulmonary Disease) Valrie Hart, RN as Oncology Nurse Navigator (Oncology) Maryanna Shape, NP as Nurse Practitioner (Oncology) Curt Bears, MD as Consulting Physician (Oncology) Pickenpack-Cousar, Carlena Sax, NP as Nurse Practitioner (Nurse Practitioner)   I connected with [name] on 08/18/21 at  3:00 PM EST by phone and verified that I am speaking with the correct person using two identifiers.   I discussed the limitations, risks, security and privacy concerns of performing an evaluation and management service by telemedicine and the availability of in-person appointments. I also discussed with the patient that there may be a patient responsible charge related to this service. The patient expressed understanding and agreed to proceed. Patient declined My Chart video visit due to limited resources. Phone meeting held in the setting patient's wife recovering from the flu and patient with similar symptoms.   Other persons participating in the visit and their role in the encounter: Jarrett Soho, RN   Patients location: Home  Providers location: [facility]   INTERVAL HISTORY:  Anthony MAZZUCA is a 65 y.o. 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 Palliative following after initial visit during recent hospitalization for ongoing symptom management support.       SOCIAL  HISTORY:     reports that he quit smoking about 5 years ago. His smoking use included cigarettes. He has a 33.00 pack-year smoking history. He has never used smokeless tobacco. He reports that he does not currently use alcohol. He reports that he does not use drugs.  ADVANCE DIRECTIVES:  None on file   CODE STATUS:   PAST MEDICAL HISTORY: Past Medical History:  Diagnosis Date   Anemia    Anxiety    Arthritis    Basal cell carcinoma (BCC) of forehead    CAD (coronary artery disease)    a. 10/2015 ant STEMI >> LHC with 3 v CAD; oLAD tx with POBA >> emergent CABG. b. Multiple evals since that time, early graft failure of SVG-RCA by cath 03/2016. c. 2/19 PCI/DES x1 to pRCA, normal EF.   Carotid artery disease ([place])    a. 40-59% BICA 02/2018.   Depression    Dyspnea    Ectopic atrial tachycardia (HCC)    Esophageal reflux    eosinophil esophagitis   Family history of adverse reaction to anesthesia    "sister has PONV" (06/21/2017)   Former tobacco use    Gout    Hepatitis C    "treated and cured" (06/21/2017)   High cholesterol    History of blood transfusion    History of kidney stones    Hypertension    Ischemic cardiomyopathy    a. EF 25-30% at intraop TEE 4/17  //  b. Limited Echo 5/17 - EF 45-50%, mild ant HK. c. EF 55-65% by cath 09/2017.   Migraine    "3-4/yr" (06/21/2017)   Myocardial infarction ([place]) 10/2015   Palpitations    Pneumonia    Sinus bradycardia    a.  HR dropping into 40s in 02/2016 -> BB reduced.   Stroke [facility]) 10/2016   "small one; sometimes my memory/cognitive issues" (06/21/2017)   Symptomatic hypotension    a. 02/2016 ER visit -> meds reduced.   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  MEDICATIONS:  Current Outpatient Medications  Medication Sig Dispense Refill   acetaminophen (TYLENOL) 325 MG tablet Take 2 tablets (650 mg total) by mouth 3 (three) times daily.     aspirin EC 81 MG tablet Take 81 mg by mouth daily. Swallow whole.     atorvastatin (LIPITOR) 80 MG tablet TAKE 1 TABLET(80 MG) BY MOUTH DAILY 90 tablet 2   docusate sodium (COLACE) 100 MG capsule Take 100 mg by mouth daily. 1-2     DULoxetine 40 MG CPEP Take 40 mg by mouth every evening. 30 capsule 1   ferrous gluconate (FERGON) 324 MG tablet Take 1 tablet (324 mg total) by mouth daily with breakfast. 30 tablet 2   gabapentin (NEURONTIN) 100 MG capsule Take 1 capsule (100 mg total) by mouth at bedtime. 30 capsule 1   HYDROmorphone (DILAUDID) 4 MG tablet Take 1 tablet (4 mg total) by mouth every 4 (four) hours as needed for moderate pain. 90 tablet 0   lidocaine (LIDODERM) 5 % Place 1 patch onto the skin daily. Remove & Discard patch within 12 hours or as directed by MD 30 patch 0   lidocaine-prilocaine (EMLA) cream Apply 1 application topically once as needed (port access).     metoprolol succinate (TOPROL-XL) 25 MG 24 hr tablet Take 1 tablet (25 mg total) by mouth daily. (Patient taking differently: Take 25 mg by mouth every evening.) 90 tablet 3   midodrine (PROAMATINE) 2.5 MG tablet Take 1 tablet (2.5 mg total) by mouth 3 (three) times daily with meals. (Patient taking differently: Take 2.5 mg by mouth 3 (three) times daily as needed  (Hypotension).) 90 tablet 5   morphine (MS CONTIN) 15 MG 12 hr tablet Take 1 tablet (15 mg total) by mouth every 12 (twelve) hours. 60 tablet 0   nitroGLYCERIN (NITROSTAT) 0.4 MG SL tablet PLACE 1 TABLET UNDER THE TONGUE EVERY 5 MINUTES AS NEEDED FOR CHEST PAIN. 3 DOSES MAX (Patient not taking: Reported on 08/05/2021) 25 tablet 4   ondansetron (ZOFRAN) 8 MG tablet Take 1 tablet (8 mg total) by mouth every 8 (eight) hours as needed for nausea or vomiting. Starting 3 days after chemotherapy 30 tablet 2   prochlorperazine (COMPAZINE) 10 MG tablet Take 1 tablet (10 mg total) by mouth every 6 (six) hours as needed. 30 tablet 2   ranolazine (RANEXA) 1000 MG SR tablet Take 1 tablet (1,000 mg total) by mouth 2 (two) times daily. 180 tablet 3  XARELTO 20 MG TABS tablet Take 20 mg by mouth every evening.     No current facility-administered medications for this visit.    VITAL SIGNS: There were no vitals taken for this visit. There were no vitals filed for this visit.  Estimated body mass index is 22.89 kg/m as calculated from the following:   Height as of 08/18/21: 5\' 9"  (1.753 m).   Weight as of 08/18/21: 155 lb (70.3 kg).  LABS: CBC:    Component Value Date/Time   WBC 8.9 08/18/2021 1211   HGB 13.8 08/18/2021 1211   HGB 12.2 (L) 08/13/2021 0942   HCT 42.4 08/18/2021 1211   PLT 214 08/18/2021 1211   PLT 212 08/13/2021 0942   MCV 91.8 08/18/2021 1211   NEUTROABS 6.3 08/18/2021 1211   LYMPHSABS 1.6 08/18/2021 1211   MONOABS 0.9 08/18/2021 1211   EOSABS 0.0 08/18/2021 1211   BASOSABS 0.0 08/18/2021 1211   Comprehensive Metabolic Panel:    Component Value Date/Time   NA 135 08/18/2021 1211   K 4.0 08/18/2021 1211   CL 100 08/18/2021 1211   CO2 25 08/18/2021 1211   BUN 29 (H) 08/18/2021 1211   CREATININE 1.36 (H) 08/18/2021 1211   CREATININE 1.17 08/13/2021 0942   CREATININE 1.00 02/12/2016 1038   GLUCOSE 118 (H) 08/18/2021 1211   CALCIUM 10.3 08/18/2021 1211   AST 21 08/18/2021  1211   AST 21 08/13/2021 0942   ALT 26 08/18/2021 1211   ALT 18 08/13/2021 0942   ALKPHOS 79 08/18/2021 1211   BILITOT 0.7 08/18/2021 1211   BILITOT 0.6 08/13/2021 0942   PROT 8.0 08/18/2021 1211   PROT 6.7 11/08/2017 0833   ALBUMIN 4.3 08/18/2021 1211   ALBUMIN 4.4 11/08/2017 0833     PERFORMANCE STATUS (ECOG) : 1 - Symptomatic but completely ambulatory   Physical Exam General: NAD Pulm: normal breathing, occasional cough/congestion  Neurological: AAO x3  IMPRESSION:  Spoke with Simona Huh via phone for follow-up.  His wife is currently at home recovering from the flu and he has similar symptoms.  Denies fever.  Does endorse cough and congestion and is currently taking Tamiflu.  He is taking MiraLAX daily which is preventing constipation.  Recommended Mucinex for ongoing cough and congestion.  Reports his appetite has been good with no concerns.  Is able to function around his home.  We discussed at length patient's ongoing pain.  He did not tolerate Xtampza ER with complaints of headache, feelings of fogginess, and some dizziness.  Started on MS Contin which he is tolerating well.  Continues to endorse ongoing pain despite use of Dilaudid for breakthrough.  Education provided on continued regimen with no recommended changes at this time as he has only been on the MS Contin for several days.  Advised [place] will continue over the next week to see how his pain does with close monitoring and make adjustments as needed.  He verbalized understanding and appreciation.  He is also taking Neurontin 100 mg at bedtime.  We could consider increasing Neurontin dosage to offer additional support.  We will continue to evaluate.  Overall Mr. Levora Dredge has no additional complaints.  We spent time discussing his recent MRI results showing 4 new.  He has spoken with radiation oncology and confirms plans to proceed with radiation with scheduled simulation on this coming Wednesday.  He is remaining  hopeful for some stability and to target areas.  He understands the severity of his cancer however is remaining hopeful.  Emotional support provided.  I discussed the importance of continued conversation with family and their medical providers regarding overall plan of care and treatment options, ensuring decisions are within the context of the patients values and GOCs.  PLAN: Continue 4 mg of Dilaudid for breakthrough pain. Continue 15 mg MS Contin twice daily MiraLAX daily for constipation Will continue to closely monitor pain and make adjustments as needed.  Education provided that we will not make adjustments on today given patient has only been on MS Contin for approximately 3 days. MRI results showing for new lesions.  Travon confirms plans to proceed with radiation therapy as recommended. I will plan to see her back in 2 weeks in collaboration with his other appointments.   Patient expressed understanding and was in agreement with this plan. He also understands that He can call the clinic at any time with any questions, concerns, or complaints.    Time Total: 40 min   Visit consisted of counseling and education dealing with the complex and emotionally intense issues of symptom management and palliative care in the setting of serious and potentially life-threatening illness.Greater than 50%  of this time was spent counseling and coordinating care related to the above assessment and plan.  Signed by: Alda Lea, AGPCNP-BC Palliative Medicine Team

## 2021-08-18 NOTE — ED Notes (Signed)
Pt states he has also been vomiting today and believes it is his sinuses causing mucus in his stomach.

## 2021-08-18 NOTE — ED Triage Notes (Signed)
Pt SOB and elevated heart rate started this morning. 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 Dx with flu over the weekend.  O2 sats 88 when ems arrived.

## 2021-08-18 NOTE — ED Notes (Signed)
Pt sitting in bed talking on phone. Call bell in reach. Bed in lowest locked position.

## 2021-08-18 NOTE — ED Notes (Signed)
Patient transported to CT 

## 2021-08-18 NOTE — H&P (Signed)
History and Physical   [name] THY:[id number] DOB: Oct 11, 1955 DOA: 08/18/2021  PCP: [name], MD   Patient coming from: Home  Chief Complaint: Flulike symptoms, shortness of breath, tachycardia  HPI: [name] is a 65 y.o. 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  Patient has had ongoing flulike symptoms for the past 4 days.  His wife tested positive for the flu and then he began to have symptoms as well but had not been tested yet.  He reports cough and some vomiting.  He did complete a course of Tamiflu outpatient after telephone visit with this provider.  Today he had new symptoms of shortness of breath whenever he was going up and down the stairs.  Has a pulse ox at home and it read a heart rate of 170 and O2 level in the 80s.  He has chronic chest pain which she reports is unchanged.  He denies chills, abdominal pain, constipation, diarrhea.   ED Course: Vital signs in the ED significant for heart rate in the 90s to 140s.  Lab work-up shows CMP with BUN 29 and creatinine 1.36 with glucose of 118.  CBC within normal limits.  PT and INR normal.  PTT mildly elevated and stable at 39.  Lactic acid initially elevated to 2.5 and improved to 2.1 with fluids.  D-dimer age-adjusted normal at 0.65.  Respiratory panel for flu and COVID was negative in the ED.  Chest x-ray showed no acute abnormality.  CTA PE study has been ordered and is in process.  Patient had improved but on attempted repeat ambulation saturations dropped to around 93% and became tachycardic into  the 140s.  Review of Systems: As per HPI otherwise all other systems reviewed and are negative.  Past Medical History:  Diagnosis Date   Anemia    Anxiety    Arthritis    Basal cell carcinoma (BCC) of forehead    CAD (coronary artery disease)    a. 10/2015 ant STEMI >> LHC with 3 v CAD; oLAD tx with POBA >> emergent CABG. b. Multiple evals since that time, early graft failure of SVG-RCA by cath 03/2016. c. 2/19 PCI/DES x1 to pRCA, normal EF.   Carotid artery disease ([place])    a. 40-59% BICA 02/2018.   Depression    Dyspnea    Ectopic atrial tachycardia (HCC)    Esophageal reflux    eosinophil esophagitis   Family history of adverse reaction to anesthesia    "sister has PONV" (06/21/2017)   Former tobacco use    Gout    Hepatitis C    "treated and cured" (06/21/2017)   High cholesterol    History of blood transfusion    History of kidney stones    Hypertension    Ischemic cardiomyopathy    a. EF 25-30% at intraop TEE 4/17  //  b. Limited Echo 5/17 - EF 45-50%, mild ant HK. c. EF 55-65% by cath 09/2017.   Migraine    "3-4/yr" (06/21/2017)   Myocardial infarction ([place]) 10/2015   Palpitations    Pneumonia    Sinus bradycardia    a. HR dropping into 40s in 02/2016 ->  BB reduced.   Stroke [facility]) 10/2016   "small one; sometimes my memory/cognitive issues" (06/21/2017)   Symptomatic hypotension    a. 02/2016 ER visit -> meds reduced.   Syncope    Wears dentures    Wears glasses     Past Surgical History:  Procedure Laterality Date   25 GAUGE PARS PLANA VITRECTOMY WITH 20 GAUGE MVR PORT  12/31/2020   Procedure: 25 GAUGE PARS PLANA VITRECTOMY WITH 20 GAUGE MVR PORT;  Surgeon: [name], MD;  Location: [place];  Service: Thoracic;;   ANTERIOR CERVICAL DECOMP/DISCECTOMY FUSION N/A 10/17/2018   Procedure: Anterior Cervical Decompression Fusion - Cervical seven -Thoracic one;  Surgeon: Consuella Lose, MD;  Location: [place];  Service: Neurosurgery;  Laterality: N/A;   BASAL  CELL CARCINOMA EXCISION     "forehead   BIOPSY  07/20/2019   Procedure: BIOPSY;  Surgeon: Carol Ada, MD;  Location: [facility];  Service: Endoscopy;;   BIOPSY OF MEDIASTINAL MASS Right 07/29/2021   Procedure: RIGHT CHEST WALL MASS BIOPSY;  Surgeon: [name], MD;  Location: [place];  Service: Thoracic;  Laterality: Right;   BRONCHIAL BIOPSY  12/24/2020   Procedure: BRONCHIAL BIOPSIES;  Surgeon: Garner Nash, DO;  Location: [facility];  Service: Pulmonary;;   BRONCHIAL BRUSHINGS  12/24/2020   Procedure: BRONCHIAL BRUSHINGS;  Surgeon: Garner Nash, DO;  Location: [place];  Service: Pulmonary;;   BRONCHIAL NEEDLE ASPIRATION BIOPSY  12/24/2020   Procedure: BRONCHIAL NEEDLE ASPIRATION BIOPSIES;  Surgeon: Garner Nash, DO;  Location: [place];  Service: Pulmonary;;   CARDIAC CATHETERIZATION N/A 11/28/2015   Procedure: Left Heart Cath and Coronary Angiography;  Surgeon: [name], MD;  Location: [place] [facility];  Service: Cardiovascular;  Laterality: N/A;   CARDIAC CATHETERIZATION N/A 11/28/2015   Procedure: Coronary Balloon Angioplasty;  Surgeon: [name], MD;  Location: [place] [facility];  Service: Cardiovascular;  Laterality: N/A;  ostial LAD   CARDIAC CATHETERIZATION N/A 11/28/2015   Procedure: Coronary/Graft Angiography;  Surgeon: [name], MD;  Location: [place] [facility];  Service: Cardiovascular;  Laterality: N/A;  coronaries only    CARDIAC CATHETERIZATION N/A 04/21/2016   Procedure: Left Heart Cath and Coronary Angiography;  Surgeon: [place], MD;  Location: [place] [facility];  Service: Cardiovascular;  Laterality: N/A;   CARDIAC CATHETERIZATION N/A 06/14/2016   Procedure: Left Heart Cath and Cors/Grafts Angiography;  Surgeon: Lorretta Harp, MD;  Location: [place] [facility];  Service: Cardiovascular;  Laterality: N/A;   CARDIAC CATHETERIZATION N/A 09/08/2016   Procedure: Left Heart Cath and Cors/Grafts  Angiography;  Surgeon: Wellington [place], MD;  Location: [place] [facility];  Service: Cardiovascular;  Laterality: N/A;   CARDIAC CATHETERIZATION     CORONARY ARTERY BYPASS GRAFT N/A 11/28/2015   Procedure: CORONARY ARTERY BYPASS GRAFTING (CABG) TIMES FIVE USING LEFT INTERNAL MAMMARY ARTERY AND RIGHT GREATER SAPHENOUS,VIEN HARVEATED BY ENDOVIEN, INTRAOPPRATIVE TEE;  Surgeon: Gaye Pollack, MD;  Location: [place];  Service: Open Heart Surgery;  Laterality: N/A;   CORONARY STENT INTERVENTION N/A 10/05/2017   Procedure: CORONARY STENT INTERVENTION;  Surgeon: [name], MD;  Location: [place] [facility];  Service: Cardiovascular;  Laterality: N/A;   ESOPHAGOGASTRODUODENOSCOPY (EGD) WITH PROPOFOL N/A 07/20/2019   Procedure: ESOPHAGOGASTRODUODENOSCOPY (EGD) WITH PROPOFOL;  Surgeon: Carol Ada, MD;  [facility];  Service: Endoscopy;  Laterality: N/A;   ESOPHAGOGASTRODUODENOSCOPY (EGD) WITH PROPOFOL N/A 01/30/2021   Procedure: ESOPHAGOGASTRODUODENOSCOPY (EGD) WITH  PROPOFOL;  Surgeon: Carol Ada, MD;  Location: [facility];  Service: Endoscopy;  Laterality: N/A;   FIDUCIAL MARKER PLACEMENT  12/24/2020   Procedure: FIDUCIAL DYE MARKER PLACEMENT;  Surgeon: Garner Nash, DO;  Location: [place] [facility];  Service: Pulmonary;;   HUMERUS SURGERY Right 1969   "tumor inside bone; filled it w/bone chips"   INTERCOSTAL NERVE BLOCK Right 12/24/2020   Procedure: INTERCOSTAL NERVE BLOCK;  Surgeon: [name], MD;  Location: [place];  Service: Thoracic;  Laterality: Right;   IR IMAGING GUIDED PORT INSERTION  02/06/2021   LEFT HEART CATH AND CORS/GRAFTS ANGIOGRAPHY N/A 03/11/2017   Procedure: Left Heart Cath and Cors/Grafts Angiography;  Surgeon: Leonie Man, MD;  Location: [place] [facility];  Service: Cardiovascular;  Laterality: N/A;   LEFT HEART CATH AND CORS/GRAFTS ANGIOGRAPHY N/A 10/05/2017   Procedure: LEFT HEART CATH AND CORS/GRAFTS ANGIOGRAPHY;  Surgeon: [name], MD;   Location: [place] [facility];  Service: Cardiovascular;  Laterality: N/A;   LEFT HEART CATH AND CORS/GRAFTS ANGIOGRAPHY N/A 04/11/2019   Procedure: LEFT HEART CATH AND CORS/GRAFTS ANGIOGRAPHY;  Surgeon: [name], MD;  Location: [place] [facility];  Service: Cardiovascular;  Laterality: N/A;   NODE DISSECTION Right 12/24/2020   Procedure: NODE DISSECTION;  Surgeon: [name], MD;  Location: [place];  Service: Thoracic;  Laterality: Right;   PERIPHERAL VASCULAR CATHETERIZATION N/A 06/14/2016   Procedure: Lower Extremity Angiography;  Surgeon: Lorretta Harp, MD;  Location: [place] [facility];  Service: Cardiovascular;  Laterality: N/A;   VIDEO BRONCHOSCOPY WITH ENDOBRONCHIAL NAVIGATION Right 12/24/2020   Procedure: VIDEO BRONCHOSCOPY WITH ENDOBRONCHIAL NAVIGATION;  Surgeon: Garner Nash, DO;  Location: [place];  Service: Pulmonary;  Laterality: Right;   VIDEO BRONCHOSCOPY WITH ENDOBRONCHIAL ULTRASOUND N/A 12/24/2020   Procedure: VIDEO BRONCHOSCOPY WITH ENDOBRONCHIAL ULTRASOUND;  Surgeon: Garner Nash, DO;  Location: [place];  Service: Pulmonary;  Laterality: N/A;   VIDEO BRONCHOSCOPY WITH INSERTION OF INTERBRONCHIAL VALVE (IBV) N/A 12/31/2020   Procedure: VIDEO BRONCHOSCOPY WITH INSERTION OF INTERBRONCHIAL VALVE (IBV).VALVE IN CARTRIDGE 59mm,9mm. CHEST TUBE PLACEMENT.;  Surgeon: [name], MD;  Location: [facility];  Service: Thoracic;  Laterality: N/A;   VIDEO BRONCHOSCOPY WITH INSERTION OF INTERBRONCHIAL VALVE (IBV) N/A 07/29/2021   Procedure: VIDEO BRONCHOSCOPY WITH REMOVAL OF INTERBRONCHIAL VALVE (IBV);  Surgeon: [name], MD;  Location: [facility];  Service: Thoracic;  Laterality: N/A;    Social History  reports that he quit smoking about 5 years ago. His smoking use included cigarettes. He has a 33.00 pack-year smoking history. He has never used smokeless tobacco. He reports that he does not currently use alcohol. He reports that he does not use  drugs.  Allergies  Allergen Reactions   Indomethacin Other (See Comments)    rectal bleeding   Prednisone Other (See Comments)    States that this med makes him "crazy"   Tetanus Toxoids Swelling and Other (See Comments)    Fever, Swelling of the arm    Wellbutrin [Bupropion] Other (See Comments)    Crazy thoughts, nightmares   Gabapentin     Other reaction(s): Unknown   Varenicline Other (See Comments)    Dreams  Other reaction(s): Unknown    Family History  Problem Relation Age of Onset   Lung cancer Mother    Heart Problems Father    Heart attack Father 63   Stroke Father    Heart failure Father    Heart attack Maternal Grandmother  Stroke Maternal Grandmother    Heart attack Paternal Uncle    Hypertension Brother    Autoimmune disease Neg Hx   Reviewed on admission  Prior to Admission medications   Medication Sig Start Date End Date Taking? Authorizing Provider  acetaminophen (TYLENOL) 325 MG tablet Take 2 tablets (650 mg total) by mouth 3 (three) times daily. 08/01/21   John Giovanni, PA-C  aspirin EC 81 MG tablet Take 81 mg by mouth daily. Swallow whole.    [provider]  atorvastatin (LIPITOR) 80 MG tablet TAKE 1 TABLET(80 MG) BY MOUTH DAILY 04/01/21   [name], MD  docusate sodium (COLACE) 100 MG capsule Take 100 mg by mouth daily. 1-2    [provider]  DULoxetine 40 MG CPEP Take 40 mg by mouth every evening. 08/01/21   John Giovanni, PA-C  ferrous gluconate (FERGON) 324 MG tablet Take 1 tablet (324 mg total) by mouth daily with breakfast. 06/11/21 09/09/21  Mariel Aloe, MD  gabapentin (NEURONTIN) 100 MG capsule Take 1 capsule (100 mg total) by mouth at bedtime. 08/01/21   Gold, Wilder Glade, PA-C  HYDROmorphone (DILAUDID) 4 MG tablet Take 1 tablet (4 mg total) by mouth every 4 (four) hours as needed for moderate pain. 08/05/21   Pickenpack-Cousar, Carlena Sax, NP  lidocaine (LIDODERM) 5 % Place 1 patch onto the skin daily. Remove & Discard  patch within 12 hours or as directed by MD 06/26/21   Quintella Reichert, MD  lidocaine-prilocaine (EMLA) cream Apply 1 application topically once as needed (port access). 02/27/21   Aline August, MD  metoprolol succinate (TOPROL-XL) 25 MG 24 hr tablet Take 1 tablet (25 mg total) by mouth daily. Patient taking differently: Take 25 mg by mouth every evening. 08/27/20   [name], MD  midodrine (PROAMATINE) 2.5 MG tablet Take 1 tablet (2.5 mg total) by mouth 3 (three) times daily with meals. Patient taking differently: Take 2.5 mg by mouth 3 (three) times daily as needed (Hypotension). 06/15/21 12/12/21  Loel Dubonnet, NP  morphine (MS CONTIN) 15 MG 12 hr tablet Take 1 tablet (15 mg total) by mouth every 12 (twelve) hours. 08/10/21   Pickenpack-Cousar, Carlena Sax, NP  nitroGLYCERIN (NITROSTAT) 0.4 MG SL tablet PLACE 1 TABLET UNDER THE TONGUE EVERY 5 MINUTES AS NEEDED FOR CHEST PAIN. 3 DOSES MAX Patient not taking: Reported on 08/05/2021 04/16/20   [name], MD  ondansetron (ZOFRAN) 8 MG tablet Take 1 tablet (8 mg total) by mouth every 8 (eight) hours as needed for nausea or vomiting. Starting 3 days after chemotherapy 04/01/21   Heilingoetter, Cassandra L, PA-C  prochlorperazine (COMPAZINE) 10 MG tablet Take 1 tablet (10 mg total) by mouth every 6 (six) hours as needed. 01/14/21   Heilingoetter, Cassandra L, PA-C  ranolazine (RANEXA) 1000 MG SR tablet Take 1 tablet (1,000 mg total) by mouth 2 (two) times daily. 05/25/21   [name], MD  XARELTO 20 MG TABS tablet Take 20 mg by mouth every evening. 06/01/21   [provider]    Physical Exam: Vitals:   08/18/21 1730 08/18/21 1800 08/18/21 1832 08/18/21 1845  BP: 129/87 115/72  (!) 140/100  Pulse: 81 91 (!) 140 91  Resp: 17 15  (!) 21  Temp:      TempSrc:      SpO2: 95% 94% 93% 99%  Weight:      Height:       Physical Exam Constitutional:  General: He is not in acute distress.    Appearance: Normal  appearance.  HENT:     Head: Normocephalic and atraumatic.     Mouth/Throat:     Mouth: Mucous membranes are moist.     Pharynx: Oropharynx is clear.  Eyes:     Extraocular Movements: Extraocular movements intact.     Pupils: Pupils are equal, round, and reactive to light.  Cardiovascular:     Rate and Rhythm: Normal rate and regular rhythm.     Pulses: Normal pulses.     Heart sounds: Normal heart sounds.  Pulmonary:     Effort: Pulmonary effort is normal. No respiratory distress.     Breath sounds: Normal breath sounds.  Abdominal:     General: Bowel sounds are normal. There is no distension.     Palpations: Abdomen is soft.     Tenderness: There is no abdominal tenderness.  Musculoskeletal:        General: No swelling or deformity.  Skin:    General: Skin is warm and dry.  Neurological:     General: No focal deficit present.     Mental Status: Mental status is at baseline.   Labs on Admission: I have personally reviewed following labs and imaging studies  CBC: Recent Labs  Lab 08/13/21 0942 08/18/21 1211  WBC 10.9* 8.9  NEUTROABS 8.5* 6.3  HGB 12.2* 13.8  HCT 37.3* 42.4  MCV 91.9 91.8  PLT 212 409    Basic Metabolic Panel: Recent Labs  Lab 08/13/21 0942 08/18/21 1211  NA 137 135  K 4.7 4.0  CL 104 100  CO2 24 25  GLUCOSE 100* 118*  BUN 17 29*  CREATININE 1.17 1.36*  CALCIUM 9.7 10.3  MG 2.1  --     GFR: Estimated Creatinine Clearance: 53.1 mL/min (A) (by C-G formula based on SCr of 1.36 mg/dL (H)).  Liver Function Tests: Recent Labs  Lab 08/13/21 0942 08/18/21 1211  AST 21 21  ALT 18 26  ALKPHOS 85 79  BILITOT 0.6 0.7  PROT 7.3 8.0  ALBUMIN 4.0 4.3    Urine analysis:    Component Value Date/Time   COLORURINE YELLOW 05/05/2021 0508   APPEARANCEUR CLEAR 05/05/2021 0508   LABSPEC 1.010 05/05/2021 0508   PHURINE 6.0 05/05/2021 0508   GLUCOSEU NEGATIVE 05/05/2021 0508   HGBUR NEGATIVE 05/05/2021 0508   BILIRUBINUR NEGATIVE 05/05/2021  0508   KETONESUR NEGATIVE 05/05/2021 0508   PROTEINUR NEGATIVE 05/05/2021 0508   UROBILINOGEN 0.2 05/13/2014 0658   NITRITE NEGATIVE 05/05/2021 0508   LEUKOCYTESUR NEGATIVE 05/05/2021 0508    Radiological Exams on Admission: DG Chest Port 1 View  Result Date: 08/18/2021 CLINICAL DATA:  Sepsis EXAM: PORTABLE CHEST 1 VIEW COMPARISON:  Chest x-ray 07/29/2021 FINDINGS: Heart size is normal. Mediastinum appears stable. Calcified plaques in the aortic arch. Cardiac surgical changes and median sternotomy wires. Right-sided central venous port with the tip near the cavoatrial junction. Elevated right hemidiaphragm with chronic stable blunting of the right costophrenic angle. No new consolidation identified. No pleural effusion or pneumothorax visualized. IMPRESSION: Stable chronic changes with no acute process identified. Electronically Signed   By: Ofilia Neas M.D.   On: 08/18/2021 13:13    EKG: Independently reviewed.  Sinus tachycardia 116 bpm.  Assessment/Plan Principal Problem:   Atrial tachycardia (HCC) Active Problems:   Fibromyalgia   Anxiety   Depression   Mixed bipolar I disorder (HCC)   S/P CABG x 5   CAD of autologous artery  bypass graft without angina   Central chest pain   Ectopic atrial tachycardia (HCC)   Cervical radiculopathy   S/P lobectomy of lung   Bipolar disorder (HCC)   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HCC)   Acute renal failure superimposed on stage 3a chronic kidney disease (HCC)   AF (paroxysmal atrial fibrillation) (HCC)   History of pulmonary embolus (PE)  Tachycardia > Patient presenting with flulike symptoms ongoing with episode of significant tachycardia in the 170s on home pulse ox with oxygen level in the 80s. > In the ED initial concern was for dehydration received fluids with some improvement but on repeat ambulation oxygen level dropped to the low 90s and heart rate went back up to the 140s. > Does have history of atrial fibrillation and ectopic  atrial tachycardia.  EKG here appears to be sinus atrial tachycardia and is triggered on minimal exertion. > Unclear etiology at this time.  Patient is status post lobectomy for lung cancer as below.  CTA PE study has been ordered. > Calcium and potassium are normal in the ED. > Last echo was earlier this year with EF 55-60% and grade 1 diastolic dysfunction.  Does have significant history of CAD and is status post CABG x5 with an graft CAD. 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  Has undergone right upper lobectomy, chemotherapy, palliative radiation secondary to chest wall lesion. Recently new brain lesions noted. - Recommend consult with oncology as he has a [place] appointment with been scheduled tomorrow.  Will need to see if there is anything they would like done while he is here. - Supportive care (pain control, etc.) - Follow-up CT chest as above  AKI on CKD 3 > Creatinine elevated to 1.36 her baseline around 1.  Concern for dehydration. > Has received 2 L IV fluids in the ED. - We will continue with IV fluids overnight - Avoid nephrotoxic agents - Trend renal function and electrolytes  Atrial fibrillation History of pulmonary embolism > Sinus tachycardia in the ED as above.  Follow-up CTA PE study. - Continue home Xarelto - Continue home metoprolol  Anxiety Depression Bipolar - Continue duloxetine  Chronic pain Fibromyalgia Cancer pain - Continue home duloxetine - Continue home gabapentin - Continue home MS Contin and as needed Dilaudid   DVT  prophylaxis: Xarelto Code Status:   Full  Family Communication:  None on admission.  Patient states he has been in contact with his wife and she knows he is here and that he is being admitted. Disposition Plan:   Patient is from:  Home  Anticipated DC to:  Home  Anticipated DC date:  1 to 4 days  Anticipated DC barriers: None  Consults called:  None  Admission status:  Observation, telemetry   Severity of Illness: The appropriate patient status for this patient is OBSERVATION. Observation status is judged to be reasonable and necessary in order to provide the required intensity of service to ensure the patient's safety. The patient's presenting symptoms, physical exam findings, and initial radiographic and laboratory data in the context of their medical condition is felt to place them at  decreased risk for further clinical deterioration. Furthermore, it is anticipated that the patient will be medically stable for discharge from the hospital within 2 midnights of admission.    Marcelyn Bruins MD [facility]  How to contact the [facility] Attending or Consulting provider [place] or covering provider during after hours [place], for this patient?   Check the care team in [facility] and look for a) attending/consulting [facility] provider listed and b) the [facility] team listed Log into [email or web address] and use [name]'s universal password to access. If you do not have the password, please contact the hospital operator. Locate the [facility] provider you are looking for under [facility] and page to a number that you can be directly reached. If you still have difficulty reaching the provider, please page the [facility] (Director on Call) for the Hospitalists listed on amion for assistance.  08/18/2021, 7:36 PM

## 2021-08-18 NOTE — ED Provider Notes (Signed)
[facility] DEPT Provider Note   CSN: [id number] Arrival date & time: 08/18/21  1120     History Chief Complaint  Patient presents with   Shortness of Breath    [name] is a 65 y.o. male.  HPI 65 year old male presents with dyspnea.  For about 4 days he has been dealing with influenza.  His wife was diagnosed via testing and then now he is sick.  He has been having a little bit of a cough that he thinks is mostly from drainage.  He has been having fever and vomiting.  However today while going up and down stairs in his house he developed dyspnea each time.  This is new.  He has a pulse ox at home and he checked and his heart rate was in the 170s and his oxygen was in the 80s.  Due to persistent dyspnea with walking he called EMS.  He endorses some chronic right-sided chest pain from prior surgery but no new pain.  Past Medical History:  Diagnosis Date   Anemia    Anxiety    Arthritis    Basal cell carcinoma (BCC) of forehead    CAD (coronary artery disease)    a. 10/2015 ant STEMI >> LHC with 3 v CAD; oLAD tx with POBA >> emergent CABG. b. Multiple evals since that time, early graft failure of SVG-RCA by cath 03/2016. c. 2/19 PCI/DES x1 to pRCA, normal EF.   Carotid artery disease ([place])    a. 40-59% BICA 02/2018.   Depression    Dyspnea    Ectopic atrial tachycardia (HCC)    Esophageal reflux    eosinophil esophagitis   Family history of adverse reaction to anesthesia    "sister has PONV" (06/21/2017)   Former tobacco use    Gout    Hepatitis C    "treated and cured" (06/21/2017)   High cholesterol    History of blood transfusion    History of kidney stones    Hypertension    Ischemic cardiomyopathy    a. EF 25-30% at intraop TEE 4/17  //  b. Limited Echo 5/17 - EF 45-50%, mild ant HK. c. EF 55-65% by cath 09/2017.   Migraine    "3-4/yr" (06/21/2017)   Myocardial infarction ([place]) 10/2015   Palpitations    Pneumonia    Sinus  bradycardia    a. HR dropping into 40s in 02/2016 -> BB reduced.   Stroke [facility]) 10/2016   "small one; sometimes my memory/cognitive issues" (06/21/2017)   Symptomatic hypotension    a. 02/2016 ER visit -> meds reduced.   Syncope    Wears dentures    Wears glasses     Patient Active Problem List   Diagnosis Date Noted   Lung cancer ([place]) 07/29/2021   Hemoptysis 06/06/2021   History of pulmonary embolus (PE) 04/29/2021   Anemia 04/29/2021   Hypotension due to hypovolemia 04/28/2021   Hypomagnesemia 04/08/2021   Port-A-Cath in place 04/07/2021   Protein-calorie malnutrition, severe 03/25/2021   PNA (pneumonia) 03/22/2021   Community acquired pneumonia 03/22/2021   Sepsis ([place]) 03/22/2021   CKD (chronic kidney disease), stage III ([place]) 03/22/2021   Hypoxia 02/28/2021   Dyspnea 02/28/2021   AKI (acute kidney injury) ([place]) 02/25/2021   AF (paroxysmal atrial fibrillation) ([place]) 02/25/2021   Pleural effusion 02/25/2021   Neutropenia, drug-induced ([place]) 02/24/2021   Positive blood cultures 02/17/2021   Positive blood culture 02/16/2021   Right flank pain 02/09/2021  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[place]) 01/30/2021   Encounter for antineoplastic chemotherapy 01/29/2021   Goals of care, counseling/discussion 01/14/2021   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   Acute back pain with sciatica 01/12/2021   Allergic rhinitis 01/12/2021   Amnesia 01/12/2021   Kidney stone 01/12/2021   Sciatica 01/12/2021   Bipolar disorder ([place]) 01/12/2021   S/P lobectomy of lung 12/24/2020   Solitary lung nodule 11/28/2020   Chest pain 11/07/2019   Gastroesophageal reflux disease    Cervical radiculopathy 10/17/2018   Preoperative clearance 10/04/2018   Palpitations    Coronary artery disease involving coronary bypass graft of native heart with angina pectoris ([place])    Transient loss of consciousness  03/24/2018   Ectopic atrial tachycardia ([place]) 02/09/2018   Central chest pain 03/10/2017   Family hx-stroke 11/10/2016   Stroke-like episode ([place]) - R brain, s/p tPA 11/09/2016   Unstable angina ([place]) 09/07/2016   Claudication of both lower extremities ([place])    Pure hypercholesterolemia    Tobacco abuse disorder    CAD of autologous artery bypass graft without angina    Chest pain at rest 06/10/2016   Abnormal nuclear stress test - HIGH RISK 04/20/2016   Old MI (myocardial infarction)    Essential hypertension 02/26/2016   Ischemic cardiomyopathy 12/25/2015   Hyperlipidemia LDL goal <70 12/25/2015   Mild tobacco abuse in early remission 11/28/2015   Coronary artery disease involving native coronary artery of native heart with angina pectoris ([place]) 11/28/2015   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   Acute MI anterior wall first episode care [facility])    Precordial chest pain 03/07/2015   Gout attack 03/07/2015   Mixed bipolar I disorder ([place]) 03/07/2015   Fibromyalgia 07/09/2014   Gout 07/09/2014   Anxiety 07/09/2014   Depression 07/09/2014   Hepatitis C 58/52/7782   Eosinophilic esophagitis 42/35/3614    Past Surgical History:  Procedure Laterality Date   25 GAUGE PARS PLANA VITRECTOMY WITH 20 GAUGE MVR PORT  12/31/2020   Procedure: 25 GAUGE PARS PLANA VITRECTOMY WITH 20 GAUGE MVR PORT;  Surgeon: [name], MD;  Location: [facility] OR;  Service: Thoracic;;   ANTERIOR CERVICAL DECOMP/DISCECTOMY FUSION N/A 10/17/2018   Procedure: Anterior Cervical Decompression Fusion - Cervical seven -Thoracic one;  Surgeon: Consuella Lose, MD;  Location: [facility];  Service: Neurosurgery;  Laterality: N/A;   BASAL CELL CARCINOMA EXCISION     "forehead   BIOPSY  07/20/2019   Procedure: BIOPSY;  Surgeon: Carol Ada, MD;  Location: [facility];  Service: Endoscopy;;   BIOPSY OF MEDIASTINAL MASS Right 07/29/2021   Procedure: RIGHT CHEST WALL MASS BIOPSY;  Surgeon: [name], MD;  Location: [facility];   Service: Thoracic;  Laterality: Right;   BRONCHIAL BIOPSY  12/24/2020   Procedure: BRONCHIAL BIOPSIES;  Surgeon: Garner Nash, DO;  Location: [facility];  Service: Pulmonary;;   BRONCHIAL BRUSHINGS  12/24/2020   Procedure: BRONCHIAL BRUSHINGS;  Surgeon: Garner Nash, DO;  Location: [facility];  Service: Pulmonary;;   BRONCHIAL NEEDLE ASPIRATION BIOPSY  12/24/2020   Procedure: BRONCHIAL NEEDLE ASPIRATION BIOPSIES;  Surgeon: Garner Nash, DO;  Location: [facility];  Service: Pulmonary;;   CARDIAC CATHETERIZATION N/A 11/28/2015   Procedure: Left Heart Cath and Coronary Angiography;  Surgeon: [name], MD;  Location: [facility] CV LAB;  Service: Cardiovascular;  Laterality: N/A;   CARDIAC CATHETERIZATION N/A 11/28/2015   Procedure: Coronary Balloon  Angioplasty;  Surgeon: [name], MD;  Location: [facility];  Service: Cardiovascular;  Laterality: N/A;  ostial LAD   CARDIAC CATHETERIZATION N/A 11/28/2015   Procedure: Coronary/Graft Angiography;  Surgeon: [name], MD;  Location: [place] CV LAB;  Service: Cardiovascular;  Laterality: N/A;  coronaries only    CARDIAC CATHETERIZATION N/A 04/21/2016   Procedure: Left Heart Cath and Coronary Angiography;  Surgeon: Wellington Hampshire, MD;  Location: [facility] [facility] LAB;  Service: Cardiovascular;  Laterality: N/A;   CARDIAC CATHETERIZATION N/A 06/14/2016   Procedure: Left Heart Cath and Cors/Grafts Angiography;  Surgeon: Lorretta Harp, MD;  Location: [facility] CV LAB;  Service: Cardiovascular;  Laterality: N/A;   CARDIAC CATHETERIZATION N/A 09/08/2016   Procedure: Left Heart Cath and Cors/Grafts Angiography;  Surgeon: Wellington Hampshire, MD;  Location: [place] CV LAB;  Service: Cardiovascular;  Laterality: N/A;   CARDIAC CATHETERIZATION     CORONARY ARTERY BYPASS GRAFT N/A 11/28/2015   Procedure: CORONARY ARTERY BYPASS GRAFTING (CABG) TIMES FIVE USING LEFT INTERNAL MAMMARY ARTERY AND RIGHT GREATER  SAPHENOUS,VIEN HARVEATED BY ENDOVIEN, INTRAOPPRATIVE TEE;  Surgeon: Gaye Pollack, MD;  Location: [facility];  Service: Open Heart Surgery;  Laterality: N/A;   CORONARY STENT INTERVENTION N/A 10/05/2017   Procedure: CORONARY STENT INTERVENTION;  Surgeon: [name], MD;  Location: [place] [facility];  Service: Cardiovascular;  Laterality: N/A;   ESOPHAGOGASTRODUODENOSCOPY (EGD) WITH PROPOFOL N/A 07/20/2019   Procedure: ESOPHAGOGASTRODUODENOSCOPY (EGD) WITH PROPOFOL;  Surgeon: Carol Ada, MD;  [facility];  Service: Endoscopy;  Laterality: N/A;   ESOPHAGOGASTRODUODENOSCOPY (EGD) WITH PROPOFOL N/A 01/30/2021   Procedure: ESOPHAGOGASTRODUODENOSCOPY (EGD) WITH PROPOFOL;  Surgeon: Carol Ada, MD;  [facility];  Service: Endoscopy;  Laterality: N/A;   FIDUCIAL MARKER PLACEMENT  12/24/2020   Procedure: FIDUCIAL DYE MARKER PLACEMENT;  Surgeon: Garner Nash, DO;  Location: [facility];  Service: Pulmonary;;   HUMERUS SURGERY Right 1969   "tumor inside bone; filled it w/bone chips"   INTERCOSTAL NERVE BLOCK Right 12/24/2020   Procedure: INTERCOSTAL NERVE BLOCK;  Surgeon: [name], MD;  Location: [facility];  Service: Thoracic;  Laterality: Right;   IR IMAGING GUIDED PORT INSERTION  02/06/2021   LEFT HEART CATH AND CORS/GRAFTS ANGIOGRAPHY N/A 03/11/2017   Procedure: Left Heart Cath and Cors/Grafts Angiography;  Surgeon: Leonie Man, MD;  Location: [place] [facility] LAB;  Service: Cardiovascular;  Laterality: N/A;   LEFT HEART CATH AND CORS/GRAFTS ANGIOGRAPHY N/A 10/05/2017   Procedure: LEFT HEART CATH AND CORS/GRAFTS ANGIOGRAPHY;  Surgeon: [name], MD;  Location: [place] [facility];  Service: Cardiovascular;  Laterality: N/A;   LEFT HEART CATH AND CORS/GRAFTS ANGIOGRAPHY N/A 04/11/2019   Procedure: LEFT HEART CATH AND CORS/GRAFTS ANGIOGRAPHY;  Surgeon: [name], MD;  Location: [place] [facility];  Service: Cardiovascular;  Laterality: N/A;   NODE  DISSECTION Right 12/24/2020   Procedure: NODE DISSECTION;  Surgeon: [name], MD;  Location: [facility];  Service: Thoracic;  Laterality: Right;   PERIPHERAL VASCULAR CATHETERIZATION N/A 06/14/2016   Procedure: Lower Extremity Angiography;  Surgeon: Lorretta Harp, MD;  Location: [facility];  Service: Cardiovascular;  Laterality: N/A;   VIDEO BRONCHOSCOPY WITH ENDOBRONCHIAL NAVIGATION Right 12/24/2020   Procedure: VIDEO BRONCHOSCOPY WITH ENDOBRONCHIAL NAVIGATION;  Surgeon: Garner Nash, DO;  Location: [facility];  Service: Pulmonary;  Laterality: Right;   VIDEO BRONCHOSCOPY WITH ENDOBRONCHIAL ULTRASOUND N/A 12/24/2020   Procedure: VIDEO BRONCHOSCOPY WITH  ENDOBRONCHIAL ULTRASOUND;  Surgeon: Garner Nash, DO;  Location: [facility];  Service: Pulmonary;  Laterality: N/A;   VIDEO BRONCHOSCOPY WITH INSERTION OF INTERBRONCHIAL VALVE (IBV) N/A 12/31/2020   Procedure: VIDEO BRONCHOSCOPY WITH INSERTION OF INTERBRONCHIAL VALVE (IBV).VALVE IN CARTRIDGE 12mm,9mm. CHEST TUBE PLACEMENT.;  Surgeon: [name], MD;  [facility];  Service: Thoracic;  Laterality: N/A;   VIDEO BRONCHOSCOPY WITH INSERTION OF INTERBRONCHIAL VALVE (IBV) N/A 07/29/2021   Procedure: VIDEO BRONCHOSCOPY WITH REMOVAL OF INTERBRONCHIAL VALVE (IBV);  Surgeon: [name], MD;  Location: [facility] OR;  Service: Thoracic;  Laterality: N/A;       Family History  Problem Relation Age of Onset   Lung cancer Mother    Heart Problems Father    Heart attack Father 55   Stroke Father    Heart failure Father    Heart attack Maternal Grandmother    Stroke Maternal Grandmother    Heart attack Paternal Uncle    Hypertension Brother    Autoimmune disease Neg Hx     Social History   Tobacco Use   Smoking status: Former    Packs/day: 0.75    Years: 44.00    Pack years: 33.00    Types: Cigarettes    Quit date: 11/28/2015    Years since quitting: 5.7   Smokeless tobacco: Never  Vaping Use   Vaping  Use: Never used  Substance Use Topics   Alcohol use: Not Currently    Comment: occ   Drug use: No    Comment: 06/21/2017 "nothing since the 1980s"    Home Medications Prior to Admission medications   Medication Sig Start Date End Date Taking? Authorizing Provider  acetaminophen (TYLENOL) 325 MG tablet Take 2 tablets (650 mg total) by mouth 3 (three) times daily. 08/01/21   John Giovanni, PA-C  aspirin EC 81 MG tablet Take 81 mg by mouth daily. Swallow whole.    [provider]  atorvastatin (LIPITOR) 80 MG tablet TAKE 1 TABLET(80 MG) BY MOUTH DAILY 04/01/21   [name], MD  docusate sodium (COLACE) 100 MG capsule Take 100 mg by mouth daily. 1-2    [provider]  DULoxetine 40 MG CPEP Take 40 mg by mouth every evening. 08/01/21   John Giovanni, PA-C  ferrous gluconate (FERGON) 324 MG tablet Take 1 tablet (324 mg total) by mouth daily with breakfast. 06/11/21 09/09/21  Mariel Aloe, MD  gabapentin (NEURONTIN) 100 MG capsule Take 1 capsule (100 mg total) by mouth at bedtime. 08/01/21   Gold, Wilder Glade, PA-C  HYDROmorphone (DILAUDID) 4 MG tablet Take 1 tablet (4 mg total) by mouth every 4 (four) hours as needed for moderate pain. 08/05/21   Pickenpack-Cousar, Carlena Sax, NP  lidocaine (LIDODERM) 5 % Place 1 patch onto the skin daily. Remove & Discard patch within 12 hours or as directed by MD 06/26/21   Quintella Reichert, MD  lidocaine-prilocaine (EMLA) cream Apply 1 application topically once as needed (port access). 02/27/21   Aline August, MD  metoprolol succinate (TOPROL-XL) 25 MG 24 hr tablet Take 1 tablet (25 mg total) by mouth daily. Patient taking differently: Take 25 mg by mouth every evening. 08/27/20   [name], MD  midodrine (PROAMATINE) 2.5 MG tablet Take 1 tablet (2.5 mg total) by mouth 3 (three) times daily with meals. Patient taking differently: Take 2.5 mg by mouth 3 (three) times daily as needed (Hypotension). 06/15/21 12/12/21  Loel Dubonnet,  NP  morphine (  MS CONTIN) 15 MG 12 hr tablet Take 1 tablet (15 mg total) by mouth every 12 (twelve) hours. 08/10/21   Pickenpack-Cousar, Carlena Sax, NP  nitroGLYCERIN (NITROSTAT) 0.4 MG SL tablet PLACE 1 TABLET UNDER THE TONGUE EVERY 5 MINUTES AS NEEDED FOR CHEST PAIN. 3 DOSES MAX Patient not taking: Reported on 08/05/2021 04/16/20   [name], MD  ondansetron (ZOFRAN) 8 MG tablet Take 1 tablet (8 mg total) by mouth every 8 (eight) hours as needed for nausea or vomiting. Starting 3 days after chemotherapy 04/01/21   Heilingoetter, Cassandra L, PA-C  prochlorperazine (COMPAZINE) 10 MG tablet Take 1 tablet (10 mg total) by mouth every 6 (six) hours as needed. 01/14/21   Heilingoetter, Cassandra L, PA-C  ranolazine (RANEXA) 1000 MG SR tablet Take 1 tablet (1,000 mg total) by mouth 2 (two) times daily. 05/25/21   [name], MD  XARELTO 20 MG TABS tablet Take 20 mg by mouth every evening. 06/01/21   [provider]    Allergies    Indomethacin, Prednisone, Tetanus toxoids, Wellbutrin [bupropion], Gabapentin, and Varenicline  Review of Systems   Review of Systems  Constitutional:  Positive for fever.  HENT:  Positive for congestion.   Respiratory:  Positive for cough and shortness of breath.   Cardiovascular:  Positive for chest pain (chronic).  Gastrointestinal:  Positive for vomiting. Negative for abdominal pain and diarrhea.  All other systems reviewed and are negative.  Physical Exam Updated Vital Signs BP 119/89    Pulse 95    Temp 99.7 F (37.6 C) (Oral)    Resp 15    Ht 5\' 9"  (1.753 m)    Wt 70.3 kg    SpO2 95%    BMI 22.89 kg/m   Physical Exam Vitals and nursing note reviewed.  Constitutional:      Appearance: He is well-developed.  HENT:     Head: Normocephalic and atraumatic.     Right Ear: External ear normal.     Left Ear: External ear normal.     Nose: Nose normal.  Eyes:     General:        Right eye: No discharge.        Left eye: No discharge.   Cardiovascular:     Rate and Rhythm: Normal rate and regular rhythm.     Heart sounds: Normal heart sounds.  Pulmonary:     Effort: Pulmonary effort is normal. Tachypnea present.     Breath sounds: Normal breath sounds.  Abdominal:     Palpations: Abdomen is soft.     Tenderness: There is abdominal tenderness.     Comments: Mild upper abdominal pain  Musculoskeletal:     Cervical back: Neck supple.  Skin:    General: Skin is warm and dry.  Neurological:     Mental Status: He is alert.  Psychiatric:        Mood and Affect: Mood is not anxious.    ED Results / Procedures / Treatments   Labs (all labs ordered are listed, but only abnormal results are displayed) Labs Reviewed  LACTIC ACID, PLASMA - Abnormal; Notable for the following components:      Result Value   Lactic Acid, Venous 2.5 (*)    All other components within normal limits  COMPREHENSIVE METABOLIC PANEL - Abnormal; Notable for the following components:   Glucose, Bld 118 (*)    BUN 29 (*)    Creatinine, Ser 1.36 (*)    GFR, Estimated  57 (*)    All other components within normal limits  APTT - Abnormal; Notable for the following components:   aPTT 39 (*)    All other components within normal limits  D-DIMER, QUANTITATIVE - Abnormal; Notable for the following components:   D-Dimer, Quant 0.65 (*)    All other components within normal limits  RESP PANEL BY RT-PCR (FLU A&B, COVID) ARPGX2  CULTURE, BLOOD (ROUTINE X 2)  CULTURE, BLOOD (ROUTINE X 2)  CBC WITH DIFFERENTIAL/PLATELET  PROTIME-INR  LACTIC ACID, PLASMA    EKG EKG Interpretation  Date/Time:  Tuesday August 18 2021 11:34:14 EST Ventricular Rate:  116 PR Interval:  148 QRS Duration: 91 QT Interval:  325 QTC Calculation: 452 R Axis:   61 Text Interpretation: Sinus tachycardia Left atrial enlargement Confirmed by Sherwood Gambler [phone]) on 08/18/2021 2:37:06 PM  Radiology DG Chest Port 1 View  Result Date: 08/18/2021 CLINICAL DATA:  Sepsis  EXAM: PORTABLE CHEST 1 VIEW COMPARISON:  Chest x-ray 07/29/2021 FINDINGS: Heart size is normal. Mediastinum appears stable. Calcified plaques in the aortic arch. Cardiac surgical changes and median sternotomy wires. Right-sided central venous port with the tip near the cavoatrial junction. Elevated right hemidiaphragm with chronic stable blunting of the right costophrenic angle. No new consolidation identified. No pleural effusion or pneumothorax visualized. IMPRESSION: Stable chronic changes with no acute process identified. Electronically Signed   By: Ofilia Neas M.D.   On: 08/18/2021 13:13    Procedures Procedures   Medications Ordered in ED Medications  ondansetron (ZOFRAN) injection 4 mg (has no administration in time range)  HYDROmorphone (DILAUDID) tablet 4 mg (has no administration in time range)  sodium chloride 0.9 % bolus 1,000 mL (1,000 mLs Intravenous New Bag/Given 08/18/21 1240)  HYDROmorphone (DILAUDID) injection 1 mg (1 mg Intravenous Given 08/18/21 1240)  ondansetron (ZOFRAN) injection 4 mg (4 mg Intravenous Given 08/18/21 1241)  acetaminophen (TYLENOL) tablet 650 mg (650 mg Oral Given 08/18/21 1240)  sodium chloride 0.9 % bolus 1,000 mL (1,000 mLs Intravenous New Bag/Given 08/18/21 1422)  HYDROmorphone (DILAUDID) injection 1 mg (1 mg Intravenous Given 08/18/21 1423)    ED Course  I have reviewed the triage vital signs and the nursing notes.  Pertinent labs & imaging results that were available during my care of the patient were reviewed by me and considered in my medical decision making (see chart for details).    MDM Rules/Calculators/A&P                         Patient is not hypoxic in the emergency department.  I suspect his shortness of breath is more prominent tachycardia and dehydration.  He endorses he was vomiting numerous times over the weekend. He is doing somewhat better with IV fluids and pain control (for chronic pain).  Chest x-ray does not show any  obvious pneumonia. Flu test is negative, though this could be a false negative.  He was ambulated and he became tachycardic but did not drop his sats.  We will give a second bolus of fluids, pain meds, and reambulate.  We will recheck lactate, which given no obvious bacterial source of infection is most likely from dehydration.  He is on Xarelto and has been taking it compliantly.  I think PE is less likely though in the differential.  Due to this, I did a D-dimer which is negative when age-adjusted.  I think PE is now unlikely. Care to Dr. Karle Starch.     Final  Clinical Impression(s) / ED Diagnoses Final diagnoses:  None    Rx / DC Orders ED Discharge Orders     None        Sherwood Gambler, MD 08/18/21 [phone]

## 2021-08-19 ENCOUNTER — Ambulatory Visit: Payer: Medicare Other | Admitting: Radiation Oncology

## 2021-08-19 ENCOUNTER — Ambulatory Visit: Payer: Medicare Other

## 2021-08-19 ENCOUNTER — Encounter (HOSPITAL_COMMUNITY): Payer: Self-pay | Admitting: Family Medicine

## 2021-08-19 DIAGNOSIS — I471 Supraventricular tachycardia: Secondary | ICD-10-CM | POA: Diagnosis present

## 2021-08-19 DIAGNOSIS — E860 Dehydration: Secondary | ICD-10-CM | POA: Diagnosis present

## 2021-08-19 DIAGNOSIS — I25810 Atherosclerosis of coronary artery bypass graft(s) without angina pectoris: Secondary | ICD-10-CM | POA: Diagnosis present

## 2021-08-19 DIAGNOSIS — R000 Tachycardia, unspecified: Secondary | ICD-10-CM | POA: Diagnosis present

## 2021-08-19 DIAGNOSIS — F3160 Bipolar disorder, current episode mixed, unspecified: Secondary | ICD-10-CM | POA: Diagnosis present

## 2021-08-19 DIAGNOSIS — Z7982 Long term (current) use of aspirin: Secondary | ICD-10-CM | POA: Diagnosis not present

## 2021-08-19 DIAGNOSIS — I129 Hypertensive chronic kidney disease with stage 1 through stage 4 chronic kidney disease, or unspecified chronic kidney disease: Secondary | ICD-10-CM | POA: Diagnosis present

## 2021-08-19 DIAGNOSIS — C7951 Secondary malignant neoplasm of bone: Secondary | ICD-10-CM | POA: Diagnosis present

## 2021-08-19 DIAGNOSIS — M1990 Unspecified osteoarthritis, unspecified site: Secondary | ICD-10-CM | POA: Diagnosis present

## 2021-08-19 DIAGNOSIS — I255 Ischemic cardiomyopathy: Secondary | ICD-10-CM | POA: Diagnosis present

## 2021-08-19 DIAGNOSIS — E7800 Pure hypercholesterolemia, unspecified: Secondary | ICD-10-CM | POA: Diagnosis present

## 2021-08-19 DIAGNOSIS — F419 Anxiety disorder, unspecified: Secondary | ICD-10-CM | POA: Diagnosis present

## 2021-08-19 DIAGNOSIS — I2510 Atherosclerotic heart disease of native coronary artery without angina pectoris: Secondary | ICD-10-CM | POA: Diagnosis present

## 2021-08-19 DIAGNOSIS — C7A8 Other malignant neuroendocrine tumors: Secondary | ICD-10-CM | POA: Diagnosis present

## 2021-08-19 DIAGNOSIS — N1831 Chronic kidney disease, stage 3a: Secondary | ICD-10-CM | POA: Diagnosis present

## 2021-08-19 DIAGNOSIS — M797 Fibromyalgia: Secondary | ICD-10-CM | POA: Diagnosis present

## 2021-08-19 DIAGNOSIS — E440 Moderate protein-calorie malnutrition: Secondary | ICD-10-CM | POA: Diagnosis present

## 2021-08-19 DIAGNOSIS — I480 Paroxysmal atrial fibrillation: Secondary | ICD-10-CM | POA: Diagnosis present

## 2021-08-19 DIAGNOSIS — G894 Chronic pain syndrome: Secondary | ICD-10-CM | POA: Diagnosis present

## 2021-08-19 DIAGNOSIS — C7931 Secondary malignant neoplasm of brain: Secondary | ICD-10-CM | POA: Diagnosis present

## 2021-08-19 DIAGNOSIS — I252 Old myocardial infarction: Secondary | ICD-10-CM | POA: Diagnosis not present

## 2021-08-19 DIAGNOSIS — M109 Gout, unspecified: Secondary | ICD-10-CM | POA: Diagnosis present

## 2021-08-19 DIAGNOSIS — N179 Acute kidney failure, unspecified: Secondary | ICD-10-CM | POA: Diagnosis present

## 2021-08-19 DIAGNOSIS — M5412 Radiculopathy, cervical region: Secondary | ICD-10-CM | POA: Diagnosis present

## 2021-08-19 DIAGNOSIS — Z20822 Contact with and (suspected) exposure to covid-19: Secondary | ICD-10-CM | POA: Diagnosis present

## 2021-08-19 LAB — BLOOD CULTURE ID PANEL (REFLEXED) - BCID2

## 2021-08-19 LAB — CBC
HCT: 37 % — ABNORMAL LOW (ref 39.0–52.0)
Hemoglobin: 12 g/dL — ABNORMAL LOW (ref 13.0–17.0)
MCH: 30.4 pg (ref 26.0–34.0)
MCHC: 32.4 g/dL (ref 30.0–36.0)
MCV: 93.7 fL (ref 80.0–100.0)
Platelets: 179 10*3/uL (ref 150–400)
RBC: 3.95 MIL/uL — ABNORMAL LOW (ref 4.22–5.81)
RDW: 12.5 % (ref 11.5–15.5)
WBC: 7.5 10*3/uL (ref 4.0–10.5)
nRBC: 0 % (ref 0.0–0.2)

## 2021-08-19 LAB — COMPREHENSIVE METABOLIC PANEL
ALT: 18 U/L (ref 0–44)
AST: 17 U/L (ref 15–41)
Albumin: 3.7 g/dL (ref 3.5–5.0)
Alkaline Phosphatase: 67 U/L (ref 38–126)
Anion gap: 6 (ref 5–15)
BUN: 21 mg/dL (ref 8–23)
CO2: 26 mmol/L (ref 22–32)
Calcium: 9.3 mg/dL (ref 8.9–10.3)
Chloride: 104 mmol/L (ref 98–111)
Creatinine, Ser: 1.04 mg/dL (ref 0.61–1.24)
GFR, Estimated: >60 mL/min (ref >60)
Glucose, Bld: 91 mg/dL (ref 70–99)
Potassium: 4.1 mmol/L (ref 3.5–5.1)
Sodium: 136 mmol/L (ref 135–145)
Total Bilirubin: 0.7 mg/dL (ref 0.3–1.2)
Total Protein: 6.8 g/dL (ref 6.5–8.1)

## 2021-08-19 MED ORDER — HYDROMORPHONE HCL 1 MG/ML IJ SOLN
0.5000 mg | INTRAMUSCULAR | Status: AC | PRN
  Administered 2021-08-19 (×3): 1 mg via INTRAVENOUS
  Filled 2021-08-19 (×3): qty 1

## 2021-08-19 MED ORDER — CHLORHEXIDINE GLUCONATE CLOTH 2 % EX PADS
6.0000 | MEDICATED_PAD | Freq: Every day | CUTANEOUS | Status: DC
  Administered 2021-08-20 – 2021-08-23 (×4): 6 via TOPICAL

## 2021-08-19 NOTE — ED Notes (Signed)
Pt in bed, pt had increased heart rate and low O2 sat when he sat up to eat lunch, pt placed on 2L O2 via .

## 2021-08-19 NOTE — ED Notes (Signed)
Pt in bed, pt reports a slight decrease in pain, states that his pain is a 7/10, pt requests pain med.  Pain med given.  Pt awake, resps even and unlabored, pt remains on O2 sat monitor, meal tray give.

## 2021-08-19 NOTE — Progress Notes (Incomplete)
Has armband been applied?    Does patient have an allergy to IV contrast dye?: No   Has patient ever received premedication for IV contrast dye?: n/a   Does patient take metformin?: No  If patient does take metformin when was the last dose: /a  Date of lab work: 08/19/2021 BUN: 21 CR: 1.04 eGFR: >60   IV site:   Has IV site been added to flowsheet?    There were no vitals taken for this visit.

## 2021-08-19 NOTE — Progress Notes (Signed)
PHARMACY - PHYSICIAN COMMUNICATION CRITICAL VALUE ALERT - BLOOD CULTURE IDENTIFICATION (BCID)  [name] is an 65 y.o. male who presented to [facility] on 08/18/2021 with a chief complaint of flu-like symptoms and tachycardia  Assessment:  BCx growing staph species in 1 bottle from each set (2/4 bottles). No obvious source; seems most consistent with contamination, although contamination of both sets is less common. However, pt without fevers or leukocytosis, and does not appear septic per MD notes.  Name of physician (or Provider) Contacted: Dwyane Dee  Current antibiotics: none  Changes to prescribed antibiotics recommended: hold off on antibiotics while patient remains stable Recommendations accepted by provider  Results for orders placed or performed during the hospital encounter of 08/18/21  Blood Culture ID Panel (Reflexed) (Collected: 08/18/2021 12:20 PM)  Result Value Ref Range   Enterococcus faecalis NOT DETECTED NOT DETECTED   Enterococcus Faecium NOT DETECTED NOT DETECTED   Listeria monocytogenes NOT DETECTED NOT DETECTED   Staphylococcus species DETECTED (A) NOT DETECTED   Staphylococcus aureus (BCID) NOT DETECTED NOT DETECTED   Staphylococcus epidermidis NOT DETECTED NOT DETECTED   Staphylococcus lugdunensis NOT DETECTED NOT DETECTED   Streptococcus species NOT DETECTED NOT DETECTED   Streptococcus agalactiae NOT DETECTED NOT DETECTED   Streptococcus pneumoniae NOT DETECTED NOT DETECTED   Streptococcus pyogenes NOT DETECTED NOT DETECTED   A.calcoaceticus-baumannii NOT DETECTED NOT DETECTED   Bacteroides fragilis NOT DETECTED NOT DETECTED   Enterobacterales NOT DETECTED NOT DETECTED   Enterobacter cloacae complex NOT DETECTED NOT DETECTED   Escherichia coli NOT DETECTED NOT DETECTED   Klebsiella aerogenes NOT DETECTED NOT DETECTED   Klebsiella oxytoca NOT DETECTED NOT DETECTED   Klebsiella pneumoniae NOT DETECTED NOT DETECTED   Proteus species NOT DETECTED NOT  DETECTED   Salmonella species NOT DETECTED NOT DETECTED   Serratia marcescens NOT DETECTED NOT DETECTED   Haemophilus influenzae NOT DETECTED NOT DETECTED   Neisseria meningitidis NOT DETECTED NOT DETECTED   Pseudomonas aeruginosa NOT DETECTED NOT DETECTED   Stenotrophomonas maltophilia NOT DETECTED NOT DETECTED   Candida albicans NOT DETECTED NOT DETECTED   Candida auris NOT DETECTED NOT DETECTED   Candida glabrata NOT DETECTED NOT DETECTED   Candida krusei NOT DETECTED NOT DETECTED   Candida parapsilosis NOT DETECTED NOT DETECTED   Candida tropicalis NOT DETECTED NOT DETECTED   Cryptococcus neoformans/gattii NOT DETECTED NOT DETECTED    [name] 08/19/2021  2:12 PM

## 2021-08-19 NOTE — Progress Notes (Signed)
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  Work-up in the ED shows lactic acid elevated 2.5 which improved to 2.1 with IV hydration.  CTA chest ruled out PE.  Patient is admitted for further evaluation and management.  Assessment & Plan:   Principal Problem:   Atrial tachycardia (HCC) Active Problems:   Fibromyalgia   Anxiety   Depression   Mixed bipolar I disorder (HCC)   S/P CABG x 5   CAD of autologous artery bypass graft without angina   Central chest pain   Ectopic atrial tachycardia (HCC)   Cervical radiculopathy   S/P lobectomy of lung   Bipolar disorder (HCC)   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HCC)   Acute renal failure superimposed on stage 3a chronic kidney disease (HCC)   AF (paroxysmal atrial fibrillation) (HCC)   History of pulmonary embolus (PE)  Flu like symptoms: Patient presented with flulike symptoms, tachycardia, hypoxia. Initial impression was dehydration so patient was giving IV hydration. CTA chest shows finding consistent with bone metastasis. Patient does have brain mets and getting started on radiation treatment. Influenza negative, COVID-negative, continue supportive care. 1 bottle blood culture grew staph, sensitivity pending.  Large cell neuroendocrine carcinoma: Patient was diagnosed in 2022 underwent right upper lobectomy,  chemotherapy,  palliative radiation secondary to the chest wall lesion. Recently have a new  brain lesion noted. Oncologist Dr. [name] was notified. Patient is going to start radiation treatment.  CAD : S/p CABG x 5. Continue aspirin, Lipitor ,metoprolol, Ranexa. Troponin negative.  AKI on CKD stage III: Resolved with IV hydration.  Atrial fibrillation: Heart rate now well controlled Continue metoprolol, continue Xarelto.  History of PE: CTA chest ruled out PE but shows finding consistent with mets Continue Xarelto  Anxiety/depression/bipolar: Continue Cymbalta  Chronic pain, fibromyalgia, cancer related pain: Continue gabapentin, since duloxetine, as needed Dilaudid   DVT prophylaxis: Xarelto Code Status: Full code. Family Communication: No family at bed side Disposition Plan:   Status is: Observation  The patient remains OBS appropriate and will d/c before 2 midnights.   Consultants:   Oncology  Procedures:  CTA chest Antimicrobials:   Anti-infectives (From admission, onward)    None        Subjective: Patient was seen and examined at bedside.  Overnight events noted.   Patient remains anxious and restless,  reports being in a lot of pain asking for pain medications.  Objective: Vitals:   08/19/21 1315 08/19/21 1330 08/19/21 1400 08/19/21 1430  BP: 100/63 108/76 106/65 105/79  Pulse: 93 100 88 81  Resp: 18   18  Temp: 98.2 F (36.8 C)     TempSrc: Oral     SpO2: 99% 98% 98% 98%  Weight:      Height:        Intake/Output Summary (Last 24 hours) at 08/19/2021 1500 Last data filed at 08/19/2021 9629 Gross per 24 hour  Intake 3327.08 ml  Output --  Net 3327.08 ml   Filed Weights   08/18/21 1133 08/19/21 0700  Weight: 70.3 kg 70.3 kg    Examination:  General exam: Appears anxious, restless, reports in a lot of pain, Respiratory system: Clear to auscultation bilaterally, no wheezing, no crackles. Cardiovascular system: S1 & S2 heard, no murmur, Gastrointestinal system: Abdomen is soft, nontender, nondistended, BS+ Central  nervous system: Alert and oriented x3. No focal neurological deficits. Extremities: No edema, no cyanosis, no clubbing. Skin: No rashes, lesions or ulcers Psychiatry: Judgement and insight appear normal. Mood & affect appropriate.     Data Reviewed: I have personally reviewed following labs and imaging studies  CBC: Recent Labs  Lab 08/13/21 0942 08/18/21 1211 08/19/21 0522  WBC 10.9* 8.9 7.5  NEUTROABS 8.5* 6.3  --   HGB 12.2* 13.8 12.0*  HCT 37.3* 42.4 37.0*  MCV 91.9 91.8 93.7  PLT 212 214 740   Basic Metabolic Panel: Recent Labs  Lab 08/13/21 0942 08/18/21 1211 08/18/21 2125 08/19/21 0522  NA 137 135  --  136  K 4.7 4.0  --  4.1  CL 104 100  --  104  CO2 24 25  --  26  GLUCOSE 100* 118*  --  91  BUN 17 29*  --  21  CREATININE 1.17 1.36*  --  1.04  CALCIUM 9.7 10.3  --  9.3  MG 2.1  --  2.3  --    GFR: Estimated Creatinine Clearance: 69.5 mL/min (by C-G formula based on SCr of 1.04 mg/dL). Liver Function Tests: Recent Labs  Lab 08/13/21 0942 08/18/21 1211 08/19/21 0522  AST 21 21 17   ALT 18 26 18   ALKPHOS 85 79 67  BILITOT 0.6 0.7 0.7  PROT 7.3 8.0 6.8  ALBUMIN 4.0 4.3 3.7   No results for input(s): LIPASE, AMYLASE in the last 168 hours. No results for input(s): AMMONIA in the last 168 hours. Coagulation Profile: Recent Labs  Lab 08/18/21 1211  INR 1.0   Cardiac Enzymes: No results for input(s): CKTOTAL, CKMB, CKMBINDEX, TROPONINI in the last 168 hours. BNP (last 3 results) No results for input(s): PROBNP in the last 8760 hours. HbA1C: No results for input(s): HGBA1C in the last 72 hours. CBG: No results for input(s): GLUCAP in the last 168 hours. Lipid Profile: No results for input(s): CHOL, HDL, LDLCALC, TRIG, CHOLHDL, LDLDIRECT in the last 72 hours. Thyroid Function Tests: No results for input(s): TSH, T4TOTAL, FREET4, T3FREE, THYROIDAB in the last 72 hours. Anemia Panel: No results for input(s): VITAMINB12, FOLATE, FERRITIN, TIBC,  IRON, RETICCTPCT in the last 72 hours. Sepsis Labs: Recent Labs  Lab 08/18/21 1230 08/18/21 1455  LATICACIDVEN 2.5* 2.1*    Recent Results (from the past 240 hour(s))  Resp Panel by RT-PCR (Flu A&B, Covid)     Status: None   Collection Time: 08/18/21 12:00 PM   Specimen: Nasopharyngeal(NP) swabs in vial transport medium  Result Value Ref Range Status   SARS Coronavirus 2 by RT PCR NEGATIVE NEGATIVE Final    Comment: (NOTE) SARS-CoV-2 target nucleic acids are NOT DETECTED.  The SARS-CoV-2 RNA is generally detectable in upper respiratory specimens during the acute phase of infection. The lowest concentration of SARS-CoV-2 viral copies this assay can detect is 138 copies/mL. A negative result does not preclude SARS-Cov-2 infection and should not be used as the sole basis for treatment or other patient management decisions. A negative result may occur with  improper specimen collection/handling, submission of specimen other than nasopharyngeal swab, presence of viral mutation(s) within the areas targeted by this assay, and inadequate number  of viral copies(<138 copies/mL). A negative result must be combined with clinical observations, patient history, and epidemiological information. The expected result is Negative.  Fact Sheet for Patients:  EntrepreneurPulse.com.au  Fact Sheet for Healthcare Providers:  IncredibleEmployment.be  This test is no t yet approved or cleared by the [place] FDA and  has been authorized for detection and/or diagnosis of SARS-CoV-2 by FDA under an Emergency Use Authorization (EUA). This EUA will remain  in effect (meaning this test can be used) for the duration of the COVID-19 declaration under Section 564(b)(1) of the Act, 21 U.S.C.section 360bbb-3(b)(1), unless the authorization is terminated  or revoked sooner.       Influenza A by PCR NEGATIVE NEGATIVE Final   Influenza B by PCR NEGATIVE NEGATIVE Final     Comment: (NOTE) The Xpert Xpress SARS-CoV-2/FLU/RSV plus assay is intended as an aid in the diagnosis of influenza from Nasopharyngeal swab specimens and should not be used as a sole basis for treatment. Nasal washings and aspirates are unacceptable for Xpert Xpress SARS-CoV-2/FLU/RSV testing.  Fact Sheet for Patients: EntrepreneurPulse.com.au  Fact Sheet for Healthcare Providers: IncredibleEmployment.be  This test is not yet approved or cleared by the [place] FDA and has been authorized for detection and/or diagnosis of SARS-CoV-2 by FDA under an Emergency Use Authorization (EUA). This EUA will remain in effect (meaning this test can be used) for the duration of the COVID-19 declaration under Section 564(b)(1) of the Act, 21 U.S.C. section 360bbb-3(b)(1), unless the authorization is terminated or revoked.  Performed at [facility], [place] [address], [place], [place] [address]   Blood Culture (routine x 2)     Status: None (Preliminary result)   Collection Time: 08/18/21 12:20 PM   Specimen: BLOOD  Result Value Ref Range Status   Specimen Description   Final    BLOOD LEFT ANTECUBITAL Performed at [facility] [address] [place], [place] [address]    Special Requests   Final    BOTTLES DRAWN AEROBIC AND ANAEROBIC Blood Culture results may not be optimal due to an inadequate volume of blood received in culture bottles Performed at [facility] [address], [place], [place] [address]    Culture  Setup Time   Final    GRAM POSITIVE COCCI ANAEROBIC BOTTLE ONLY CRITICAL VALUE NOTED.  VALUE IS CONSISTENT WITH PREVIOUSLY REPORTED AND CALLED VALUE. Performed at [facility], [place] [address], [place], [place] [address]    Culture GRAM POSITIVE COCCI  Final   Report Status PENDING  Incomplete  Blood Culture (routine x 2)     Status: None (Preliminary result)    Collection Time: 08/18/21 12:20 PM   Specimen: BLOOD  Result Value Ref Range Status   Specimen Description   Final    BLOOD RIGHT ANTECUBITAL Performed at [facility] [address], [place], [place] [address]    Special Requests   Final    BOTTLES DRAWN AEROBIC AND ANAEROBIC Blood Culture adequate volume Performed at [facility] [address], [place], [place] [address]    Culture  Setup Time   Final    GRAM POSITIVE COCCI IN CLUSTERS AEROBIC BOTTLE ONLY Organism ID to follow CRITICAL RESULT CALLED TO, READ BACK BY AND VERIFIED WITH: PHARMD A PHAM [id number] MLM Performed at [facility], [address], [place],  [address]    Culture GRAM POSITIVE COCCI  Final   Report Status PENDING  Incomplete  Blood Culture ID Panel (Reflexed)     Status: Abnormal   Collection Time: 08/18/21 12:20 PM  Result Value Ref Range Status   Enterococcus faecalis NOT DETECTED NOT DETECTED Final   Enterococcus Faecium NOT DETECTED NOT DETECTED Final   Listeria monocytogenes NOT DETECTED NOT DETECTED Final   Staphylococcus species DETECTED (A) NOT DETECTED Final    Comment: CRITICAL RESULT CALLED TO, READ BACK BY AND VERIFIED WITH: PJHARMD A PHAM [phone] MLM    Staphylococcus aureus (BCID) NOT DETECTED NOT DETECTED Final   Staphylococcus epidermidis NOT DETECTED NOT DETECTED Final   Staphylococcus lugdunensis NOT DETECTED NOT DETECTED Final   Streptococcus species NOT DETECTED NOT DETECTED Final   Streptococcus agalactiae NOT DETECTED NOT DETECTED Final   Streptococcus pneumoniae NOT DETECTED NOT DETECTED Final   Streptococcus pyogenes NOT DETECTED NOT DETECTED Final   A.calcoaceticus-baumannii NOT DETECTED NOT DETECTED Final   Bacteroides fragilis NOT DETECTED NOT DETECTED Final   Enterobacterales NOT DETECTED NOT DETECTED Final   Enterobacter cloacae complex NOT DETECTED NOT DETECTED Final   Escherichia coli NOT DETECTED NOT DETECTED  Final   Klebsiella aerogenes NOT DETECTED NOT DETECTED Final   Klebsiella oxytoca NOT DETECTED NOT DETECTED Final   Klebsiella pneumoniae NOT DETECTED NOT DETECTED Final   Proteus species NOT DETECTED NOT DETECTED Final   Salmonella species NOT DETECTED NOT DETECTED Final   Serratia marcescens NOT DETECTED NOT DETECTED Final   Haemophilus influenzae NOT DETECTED NOT DETECTED Final   Neisseria meningitidis NOT DETECTED NOT DETECTED Final   Pseudomonas aeruginosa NOT DETECTED NOT DETECTED Final   Stenotrophomonas maltophilia NOT DETECTED NOT DETECTED Final   Candida albicans NOT DETECTED NOT DETECTED Final   Candida auris NOT DETECTED NOT DETECTED Final   Candida glabrata NOT DETECTED NOT DETECTED Final   Candida krusei NOT DETECTED NOT DETECTED Final   Candida parapsilosis NOT DETECTED NOT DETECTED Final   Candida tropicalis NOT DETECTED NOT DETECTED Final   Cryptococcus neoformans/gattii NOT DETECTED NOT DETECTED Final    Comment: Performed at [facility], [address], [place], [place] [address]    Radiology Studies: CT Angio Chest PE W/Cm &/Or Wo Cm  Result Date: 08/18/2021 CLINICAL DATA:  Pulmonary embolism (PE) suspected, positive D-dimer. Today while going up and down stairs in his house he developed dyspnea each time. This is new. He has a pulse ox at home and he checked and his heart rate was in the 170s and his oxygen was in the 80s. EXAM: CT ANGIOGRAPHY CHEST WITH CONTRAST TECHNIQUE: Multidetector CT imaging of the chest was performed using the standard protocol during bolus administration of intravenous contrast. Multiplanar CT image reconstructions and MIPs were obtained to evaluate the vascular anatomy. CONTRAST:  39mL OMNIPAQUE IOHEXOL 350 MG/ML SOLN COMPARISON:  None. FINDINGS: Cardiovascular: Satisfactory opacification of the pulmonary arteries to the segmental level. Limited evaluation of the subsegmental level due to motion artifact. No evidence of pulmonary  embolism. The main pulmonary artery is normal in caliber. Normal heart size. No significant pericardial effusion. The thoracic aorta is normal in caliber. At least mild atherosclerotic plaque of the thoracic aorta. Four-vessel coronary artery calcifications status post coronary artery bypass graft. Mediastinum/Nodes: No enlarged mediastinal, hilar, or axillary lymph nodes. Thyroid gland, trachea, and esophagus demonstrate no significant findings. Lungs/Pleura: Interval development of peribronchovascular ground-glass airspace opacities within the left lower lobe. Interval decrease in persistent right lower lobe peribronchovascular ground-glass airspace opacities. Interval increase in site of a left upper lobe  1.1 x 0.8 cm pulmonary nodule (from 0.6 cm). No pulmonary mass. Trace right pleural effusion. No left pleural effusion. No pneumothorax. Upper Abdomen: Splenule noted. Partially visualized fluid density lesion within the left kidney. No acute abnormality. Musculoskeletal: Interval development of soft tissue densities within the right chest wall measuring 1 x 0.8 cm and 1.6 x 1.7 cm (5: 246-252). Interval increase in size of lytic lesions along the posterior cortex of the right eleventh rib (4:034-742) with a nondisplaced pathologic fracture through the rib not excluded (5:255). Interval development of a couple of lytic lesion within the right ninth rib: Along the anterior cortex (5:191) in the posterior cortex through the anterior cortex (5:225). Old healed right eighth rib fracture anterolaterally. Multilevel degenerative changes of the spine. Anterior and interbody C7-T1 fusion with surgical hardware. Review of the MIP images confirms the above findings. IMPRESSION: 1. Interval increase in size and interval development of a new right rib lytic lesions concerning for osseous metastases. A pathologic nondisplaced fracture of the right eleventh rib is not excluded. 2. Interval development of soft tissue densities  within the right chest wall measuring 1 x 0.8 cm and 1.6 x 1.7 cm. These are concerning for metastases given right chest wall hypermetabolic activity on PET CT 07/03/2021. 3. Interval increase in site of a left upper lobe 1.1 x 0.8 cm pulmonary nodule (from 0.6 cm). 4. Interval development of peribronchovascular ground-glass airspace opacities within the left lower lobe. Interval decrease in persistent right lower lobe peribronchovascular ground-glass airspace opacities. Findings suggestive of infection/inflammation. Underlying malignancy not excluded. 5. Trace right pleural effusion. Electronically Signed   By: Iven Finn M.D.   On: 08/18/2021 20:02   DG Chest Port 1 View  Result Date: 08/18/2021 CLINICAL DATA:  Sepsis EXAM: PORTABLE CHEST 1 VIEW COMPARISON:  Chest x-ray 07/29/2021 FINDINGS: Heart size is normal. Mediastinum appears stable. Calcified plaques in the aortic arch. Cardiac surgical changes and median sternotomy wires. Right-sided central venous port with the tip near the cavoatrial junction. Elevated right hemidiaphragm with chronic stable blunting of the right costophrenic angle. No new consolidation identified. No pleural effusion or pneumothorax visualized. IMPRESSION: Stable chronic changes with no acute process identified. Electronically Signed   By: Ofilia Neas M.D.   On: 08/18/2021 13:13    Scheduled Meds:  aspirin EC  81 mg Oral Daily   atorvastatin  80 mg Oral Daily   DULoxetine  40 mg Oral q1800   gabapentin  100 mg Oral QHS   lidocaine  1 patch Transdermal Q24H   metoprolol succinate  25 mg Oral QPM   morphine  15 mg Oral Q12H   ranolazine  1,000 mg Oral BID   rivaroxaban  20 mg Oral Q supper   sodium chloride flush  3 mL Intravenous Q12H   Continuous Infusions:   LOS: 0 days    Time spent: 35 min    Neri Vieyra, MD [facility]   If 7PM-7AM, please contact night-coverage

## 2021-08-19 NOTE — ED Notes (Signed)
ED TO INPATIENT HANDOFF REPORT  ED Nurse Name and Phone #: Salvatore Decent Name/Age/Gender [name] 65 y.o. male Room/Bed: WA14/WA14  Code Status   Code Status: Full Code  Home/SNF/Other Home Patient oriented to: self, place, time, and situation Is this baseline? Yes   Triage Complete: Triage complete  Chief Complaint Atrial tachycardia ([place]) [I47.1]  Triage Note Pt SOB and elevated heart rate started this morning. 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 Dx with flu over the weekend.  O2 sats 88 when ems arrived.    Allergies Allergies  Allergen Reactions   Indomethacin Other (See Comments)    rectal bleeding   Prednisone Other (See Comments)    States that this med makes him "crazy"   Tetanus Toxoids Swelling and Other (See Comments)    Fever, Swelling of the arm    Wellbutrin [Bupropion] Other (See Comments)    Crazy thoughts, nightmares   Gabapentin     Other reaction(s): Unknown   Varenicline Other (See Comments)    Dreams  Other reaction(s): Unknown    Level of Care/Admitting Diagnosis ED Disposition     ED Disposition  Admit   Condition  --   [place]: [facility] [[id number]]  Level of Care: Telemetry [5]  Admit to tele based on following criteria: Complex arrhythmia (Bradycardia/Tachycardia)  May place patient in observation at [facility] or [facility] if equivalent level of care is available:: Yes  Covid Evaluation: Confirmed COVID Negative  Diagnosis: Atrial tachycardia [facility]) [[id number]]  Admitting Physician: Marcelyn Bruins [[id number]]  Attending Physician: Marcelyn Bruins [[id number]]          B Medical/Surgery History Past Medical History:  Diagnosis Date   Anemia    Anxiety    Arthritis    Basal cell carcinoma (BCC) of forehead    CAD (coronary artery disease)    a. 10/2015 ant STEMI >> LHC with 3 v CAD; oLAD tx with POBA >> emergent CABG. b. Multiple evals since that time, early graft  failure of SVG-RCA by cath 03/2016. c. 2/19 PCI/DES x1 to pRCA, normal EF.   Carotid artery disease ([place])    a. 40-59% BICA 02/2018.   Depression    Dyspnea    Ectopic atrial tachycardia (HCC)    Esophageal reflux    eosinophil esophagitis   Family history of adverse reaction to anesthesia    "sister has PONV" (06/21/2017)   Former tobacco use    Gout    Hepatitis C    "treated and cured" (06/21/2017)   High cholesterol    History of blood transfusion    History of kidney stones    Hypertension    Ischemic cardiomyopathy    a. EF 25-30% at intraop TEE 4/17  //  b. Limited Echo 5/17 - EF 45-50%, mild ant HK. c. EF 55-65% by cath 09/2017.   Migraine    "3-4/yr" (06/21/2017)   Myocardial infarction ([place]) 10/2015   Palpitations    Pneumonia    Sinus bradycardia    a. HR dropping into 40s in 02/2016 -> BB reduced.   Stroke [facility]) 10/2016   "small one; sometimes my memory/cognitive issues" (06/21/2017)   Symptomatic hypotension    a. 02/2016 ER visit -> meds reduced.   Syncope    Wears dentures    Wears glasses    Past Surgical History:  Procedure Laterality Date   25 GAUGE PARS PLANA VITRECTOMY WITH 20 GAUGE  MVR PORT  12/31/2020   Procedure: 25 GAUGE PARS PLANA VITRECTOMY WITH 20 GAUGE MVR PORT;  Surgeon: [name], MD;  [facility];  Service: Thoracic;;   ANTERIOR CERVICAL DECOMP/DISCECTOMY FUSION N/A 10/17/2018   Procedure: Anterior Cervical Decompression Fusion - Cervical seven -Thoracic one;  Surgeon: Consuella Lose, MD;  Location: [place];  Service: Neurosurgery;  Laterality: N/A;   BASAL CELL CARCINOMA EXCISION     "forehead   BIOPSY  07/20/2019   Procedure: BIOPSY;  Surgeon: Carol Ada, MD;  [facility];  Service: Endoscopy;;   BIOPSY OF MEDIASTINAL MASS Right 07/29/2021   Procedure: RIGHT CHEST WALL MASS BIOPSY;  Surgeon: [name], MD;  Location: [place];  Service: Thoracic;  Laterality: Right;   BRONCHIAL BIOPSY  12/24/2020    Procedure: BRONCHIAL BIOPSIES;  Surgeon: Garner Nash, DO;  Location: [place] [facility];  Service: Pulmonary;;   BRONCHIAL BRUSHINGS  12/24/2020   Procedure: BRONCHIAL BRUSHINGS;  Surgeon: Garner Nash, DO;  Location: [place];  Service: Pulmonary;;   BRONCHIAL NEEDLE ASPIRATION BIOPSY  12/24/2020   Procedure: BRONCHIAL NEEDLE ASPIRATION BIOPSIES;  Surgeon: Garner Nash, DO;  Location: [place];  Service: Pulmonary;;   CARDIAC CATHETERIZATION N/A 11/28/2015   Procedure: Left Heart Cath and Coronary Angiography;  Surgeon: [name], MD;  Location: [place] [facility];  Service: Cardiovascular;  Laterality: N/A;   CARDIAC CATHETERIZATION N/A 11/28/2015   Procedure: Coronary Balloon Angioplasty;  Surgeon: [name], MD;  Location: [place] [facility];  Service: Cardiovascular;  Laterality: N/A;  ostial LAD   CARDIAC CATHETERIZATION N/A 11/28/2015   Procedure: Coronary/Graft Angiography;  Surgeon: [name], MD;  Location: [place] [facility];  Service: Cardiovascular;  Laterality: N/A;  coronaries only    CARDIAC CATHETERIZATION N/A 04/21/2016   Procedure: Left Heart Cath and Coronary Angiography;  Surgeon: Wellington [place], MD;  Location: [place] [facility];  Service: Cardiovascular;  Laterality: N/A;   CARDIAC CATHETERIZATION N/A 06/14/2016   Procedure: Left Heart Cath and Cors/Grafts Angiography;  Surgeon: Lorretta Harp, MD;  Location: [place] [facility];  Service: Cardiovascular;  Laterality: N/A;   CARDIAC CATHETERIZATION N/A 09/08/2016   Procedure: Left Heart Cath and Cors/Grafts Angiography;  Surgeon: Wellington [place], MD;  Location: [place] [facility];  Service: Cardiovascular;  Laterality: N/A;   CARDIAC CATHETERIZATION     CORONARY ARTERY BYPASS GRAFT N/A 11/28/2015   Procedure: CORONARY ARTERY BYPASS GRAFTING (CABG) TIMES FIVE USING LEFT INTERNAL MAMMARY ARTERY AND RIGHT GREATER SAPHENOUS,VIEN HARVEATED BY ENDOVIEN, INTRAOPPRATIVE TEE;  Surgeon: Gaye Pollack, MD;  Location: [place];  Service: Open Heart Surgery;  Laterality: N/A;   CORONARY STENT INTERVENTION N/A 10/05/2017   Procedure: CORONARY STENT INTERVENTION;  Surgeon: [name], MD;  Location: [place] [facility];  Service: Cardiovascular;  Laterality: N/A;   ESOPHAGOGASTRODUODENOSCOPY (EGD) WITH PROPOFOL N/A 07/20/2019   Procedure: ESOPHAGOGASTRODUODENOSCOPY (EGD) WITH PROPOFOL;  Surgeon: Carol Ada, MD;  [facility];  Service: Endoscopy;  Laterality: N/A;   ESOPHAGOGASTRODUODENOSCOPY (EGD) WITH PROPOFOL N/A 01/30/2021   Procedure: ESOPHAGOGASTRODUODENOSCOPY (EGD) WITH PROPOFOL;  Surgeon: Carol Ada, MD;  [facility];  Service: Endoscopy;  Laterality: N/A;   FIDUCIAL MARKER PLACEMENT  12/24/2020   Procedure: FIDUCIAL DYE MARKER PLACEMENT;  Surgeon: Garner Nash, DO;  Location: [place] [facility];  Service: Pulmonary;;   HUMERUS SURGERY Right 1969   "tumor inside bone; filled it w/bone chips"   INTERCOSTAL NERVE BLOCK Right  12/24/2020   Procedure: INTERCOSTAL NERVE BLOCK;  Surgeon: [name], MD;  Location: [place];  Service: Thoracic;  Laterality: Right;   IR IMAGING GUIDED PORT INSERTION  02/06/2021   LEFT HEART CATH AND CORS/GRAFTS ANGIOGRAPHY N/A 03/11/2017   Procedure: Left Heart Cath and Cors/Grafts Angiography;  Surgeon: Leonie Man, MD;  Location: [place] [facility];  Service: Cardiovascular;  Laterality: N/A;   LEFT HEART CATH AND CORS/GRAFTS ANGIOGRAPHY N/A 10/05/2017   Procedure: LEFT HEART CATH AND CORS/GRAFTS ANGIOGRAPHY;  Surgeon: [name], MD;  Location: [place] [facility];  Service: Cardiovascular;  Laterality: N/A;   LEFT HEART CATH AND CORS/GRAFTS ANGIOGRAPHY N/A 04/11/2019   Procedure: LEFT HEART CATH AND CORS/GRAFTS ANGIOGRAPHY;  Surgeon: [name], MD;  Location: [place] [facility];  Service: Cardiovascular;  Laterality: N/A;   NODE DISSECTION Right 12/24/2020   Procedure: NODE DISSECTION;  Surgeon:  [name], MD;  Location: [place];  Service: Thoracic;  Laterality: Right;   PERIPHERAL VASCULAR CATHETERIZATION N/A 06/14/2016   Procedure: Lower Extremity Angiography;  Surgeon: Lorretta Harp, MD;  Location: [place] [facility];  Service: Cardiovascular;  Laterality: N/A;   VIDEO BRONCHOSCOPY WITH ENDOBRONCHIAL NAVIGATION Right 12/24/2020   Procedure: VIDEO BRONCHOSCOPY WITH ENDOBRONCHIAL NAVIGATION;  Surgeon: Garner Nash, DO;  Location: [place];  Service: Pulmonary;  Laterality: Right;   VIDEO BRONCHOSCOPY WITH ENDOBRONCHIAL ULTRASOUND N/A 12/24/2020   Procedure: VIDEO BRONCHOSCOPY WITH ENDOBRONCHIAL ULTRASOUND;  Surgeon: Garner Nash, DO;  Location: [place];  Service: Pulmonary;  Laterality: N/A;   VIDEO BRONCHOSCOPY WITH INSERTION OF INTERBRONCHIAL VALVE (IBV) N/A 12/31/2020   Procedure: VIDEO BRONCHOSCOPY WITH INSERTION OF INTERBRONCHIAL VALVE (IBV).VALVE IN CARTRIDGE 22mm,9mm. CHEST TUBE PLACEMENT.;  Surgeon: [name], MD;  [facility];  Service: Thoracic;  Laterality: N/A;   VIDEO BRONCHOSCOPY WITH INSERTION OF INTERBRONCHIAL VALVE (IBV) N/A 07/29/2021   Procedure: VIDEO BRONCHOSCOPY WITH REMOVAL OF INTERBRONCHIAL VALVE (IBV);  Surgeon: [name], MD;  Location: [place];  Service: Thoracic;  Laterality: N/A;     A IV Location/Drains/Wounds Patient Lines/Drains/Airways Status     Active Line/Drains/Airways     Name Placement date Placement time Site Days   Implanted Port 12/24/20 Right 12/24/20  --  --  238   Peripheral IV 08/18/21 20 G 1.25" Left Antecubital 08/18/21  1128  Antecubital  1   Peripheral IV 08/19/21 20 G Anterior;Right;Upper Arm 08/19/21  0142  Arm  less than 1   Incision (Closed) 12/24/20 Chest Right 12/24/20  0743  -- 238   Incision (Closed) 07/29/21 Chest Right 07/29/21  1509  -- 21            Intake/Output Last 24 hours  Intake/Output Summary (Last 24 hours) at 08/19/2021 1328 Last data filed at 08/19/2021  2355 Gross per 24 hour  Intake 3327.08 ml  Output --  Net 3327.08 ml    Labs/Imaging Results for orders placed or performed during the hospital encounter of 08/18/21 (from the past 48 hour(s))  Resp Panel by RT-PCR (Flu A&B, Covid)     Status: None   Collection Time: 08/18/21 12:00 PM   Specimen: Nasopharyngeal(NP) swabs in vial transport medium  Result Value Ref Range   SARS Coronavirus 2 by RT PCR NEGATIVE NEGATIVE    Comment: (NOTE) SARS-CoV-2 target nucleic acids are NOT DETECTED.  The SARS-CoV-2 RNA is generally detectable in upper respiratory specimens during the acute phase of infection. The lowest concentration of SARS-CoV-2 viral  copies this assay can detect is 138 copies/mL. A negative result does not preclude SARS-Cov-2 infection and should not be used as the sole basis for treatment or other patient management decisions. A negative result may occur with  improper specimen collection/handling, submission of specimen other than nasopharyngeal swab, presence of viral mutation(s) within the areas targeted by this assay, and inadequate number of viral copies(<138 copies/mL). A negative result must be combined with clinical observations, patient history, and epidemiological information. The expected result is Negative.  Fact Sheet for Patients:  EntrepreneurPulse.com.au  Fact Sheet for Healthcare Providers:  IncredibleEmployment.be  This test is no t yet approved or cleared by the [place] FDA and  has been authorized for detection and/or diagnosis of SARS-CoV-2 by FDA under an Emergency Use Authorization (EUA). This EUA will remain  in effect (meaning this test can be used) for the duration of the COVID-19 declaration under Section 564(b)(1) of the Act, 21 U.S.C.section 360bbb-3(b)(1), unless the authorization is terminated  or revoked sooner.       Influenza A by PCR NEGATIVE NEGATIVE   Influenza B by PCR NEGATIVE NEGATIVE     Comment: (NOTE) The Xpert Xpress SARS-CoV-2/FLU/RSV plus assay is intended as an aid in the diagnosis of influenza from Nasopharyngeal swab specimens and should not be used as a sole basis for treatment. Nasal washings and aspirates are unacceptable for Xpert Xpress SARS-CoV-2/FLU/RSV testing.  Fact Sheet for Patients: EntrepreneurPulse.com.au  Fact Sheet for Healthcare Providers: IncredibleEmployment.be  This test is not yet approved or cleared by the [place] FDA and has been authorized for detection and/or diagnosis of SARS-CoV-2 by FDA under an Emergency Use Authorization (EUA). This EUA will remain in effect (meaning this test can be used) for the duration of the COVID-19 declaration under Section 564(b)(1) of the Act, 21 U.S.C. section 360bbb-3(b)(1), unless the authorization is terminated or revoked.  Performed at [facility], [place] [address], [place], [place] [address]   Comprehensive metabolic panel     Status: Abnormal   Collection Time: 08/18/21 12:11 PM  Result Value Ref Range   Sodium 135 135 - 145 mmol/L   Potassium 4.0 3.5 - 5.1 mmol/L   Chloride 100 98 - 111 mmol/L   CO2 25 22 - 32 mmol/L   Glucose, Bld 118 (H) 70 - 99 mg/dL    Comment: Glucose reference range applies only to samples taken after fasting for at least 8 hours.   BUN 29 (H) 8 - 23 mg/dL   Creatinine, Ser 1.36 (H) 0.61 - 1.24 mg/dL   Calcium 10.3 8.9 - 10.3 mg/dL   Total Protein 8.0 6.5 - 8.1 g/dL   Albumin 4.3 3.5 - 5.0 g/dL   AST 21 15 - 41 U/L   ALT 26 0 - 44 U/L   Alkaline Phosphatase 79 38 - 126 U/L   Total Bilirubin 0.7 0.3 - 1.2 mg/dL   GFR, Estimated 57 (L) >60 mL/min    Comment: (NOTE) Calculated using the CKD-EPI Creatinine Equation (2021)    Anion gap 10 5 - 15    Comment: Performed at [facility], [place] [address], [place], [place] [address]  CBC WITH DIFFERENTIAL     Status: None   Collection  Time: 08/18/21 12:11 PM  Result Value Ref Range   WBC 8.9 4.0 - 10.5 K/uL   RBC 4.62 4.22 - 5.81 MIL/uL   Hemoglobin 13.8 13.0 - 17.0 g/dL   HCT 42.4 39.0 - 52.0 %  MCV 91.8 80.0 - 100.0 fL   MCH 29.9 26.0 - 34.0 pg   MCHC 32.5 30.0 - 36.0 g/dL   RDW 12.6 11.5 - 15.5 %   Platelets 214 150 - 400 K/uL   nRBC 0.0 0.0 - 0.2 %   Neutrophils Relative % 72 %   Neutro Abs 6.3 1.7 - 7.7 K/uL   Lymphocytes Relative 18 %   Lymphs Abs 1.6 0.7 - 4.0 K/uL   Monocytes Relative 10 %   Monocytes Absolute 0.9 0.1 - 1.0 K/uL   Eosinophils Relative 0 %   Eosinophils Absolute 0.0 0.0 - 0.5 K/uL   Basophils Relative 0 %   Basophils Absolute 0.0 0.0 - 0.1 K/uL   Immature Granulocytes 0 %   Abs Immature Granulocytes 0.03 0.00 - 0.07 K/uL    Comment: Performed at [facility], [place] [address] [place], [place] [address]  Protime-INR     Status: None   Collection Time: 08/18/21 12:11 PM  Result Value Ref Range   Prothrombin Time 12.7 11.4 - 15.2 seconds   INR 1.0 0.8 - 1.2    Comment: (NOTE) INR goal varies based on device and disease states. Performed at [facility], [place] [address] [place], [place] [address]   APTT     Status: Abnormal   Collection Time: 08/18/21 12:11 PM  Result Value Ref Range   aPTT 39 (H) 24 - 36 seconds    Comment:        IF BASELINE aPTT IS ELEVATED, SUGGEST PATIENT RISK ASSESSMENT BE USED TO DETERMINE APPROPRIATE ANTICOAGULANT THERAPY. Performed at [facility], [place] [address], [place], [place] [address]   Blood Culture (routine x 2)     Status: None (Preliminary result)   Collection Time: 08/18/21 12:20 PM   Specimen: BLOOD  Result Value Ref Range   Specimen Description      BLOOD LEFT ANTECUBITAL Performed at [facility] [address] [place], [place] [address]    Special Requests      BOTTLES DRAWN AEROBIC AND ANAEROBIC Blood Culture results may not be optimal due to an  inadequate volume of blood received in culture bottles Performed at [facility], [place] [address], [place], [place] [address]    Culture  Setup Time      GRAM POSITIVE COCCI ANAEROBIC BOTTLE ONLY Performed at [facility], [place] [address] [place], [place] [address]    Culture GRAM POSITIVE COCCI    Report Status PENDING   Blood Culture (routine x 2)     Status: None (Preliminary result)   Collection Time: 08/18/21 12:20 PM   Specimen: BLOOD  Result Value Ref Range   Specimen Description      BLOOD RIGHT ANTECUBITAL Performed at [facility], [place] [address] [place], [place] [address]    Special Requests      BOTTLES DRAWN AEROBIC AND ANAEROBIC Blood Culture adequate volume Performed at [place] [address] [place], [place] [address]    Culture  Setup Time      GRAM POSITIVE COCCI IN CLUSTERS AEROBIC BOTTLE ONLY Organism ID to follow Performed at [facility], [place] [address] [place], [place] [address]    Culture GRAM POSITIVE COCCI    Report Status PENDING   Lactic acid, plasma     Status: Abnormal   Collection Time: 08/18/21 12:30 PM  Result Value Ref Range   Lactic Acid,  Venous 2.5 (HH) 0.5 - 1.9 mmol/L    Comment: CRITICAL RESULT CALLED TO, READ BACK BY AND VERIFIED WITH: WEST,S. RN AT 1311 08/18/21 MULLINS,T Performed at [facility], [place] [address], [place], [place] [address]   D-dimer, quantitative     Status: Abnormal   Collection Time: 08/18/21  2:13 PM  Result Value Ref Range   D-Dimer, Quant 0.65 (H) 0.00 - 0.50 ug/mL-FEU    Comment: (NOTE) At the manufacturer cut-off value of 0.5 g/mL FEU, this assay has a negative predictive value of 95-100%.This assay is intended for use in conjunction with a clinical pretest probability (PTP) assessment model to exclude pulmonary embolism (PE) and deep venous thrombosis (DVT) in outpatients suspected of PE or DVT. Results  should be correlated with clinical presentation. Performed at [facility], [place] [address], [place], [place] [address]   Lactic acid, plasma     Status: Abnormal   Collection Time: 08/18/21  2:55 PM  Result Value Ref Range   Lactic Acid, Venous 2.1 (HH) 0.5 - 1.9 mmol/L    Comment: CRITICAL VALUE NOTED.  VALUE IS CONSISTENT WITH PREVIOUSLY REPORTED AND CALLED VALUE. Performed at [facility], [place] [address] [place], [place] [address]   Magnesium     Status: None   Collection Time: 08/18/21  9:25 PM  Result Value Ref Range   Magnesium 2.3 1.7 - 2.4 mg/dL    Comment: Performed at [facility], [place] [address], [place], [place] [address]  Troponin I (High Sensitivity)     Status: None   Collection Time: 08/18/21  9:25 PM  Result Value Ref Range   Troponin I (High Sensitivity) 6 <18 ng/L    Comment: (NOTE) Elevated high sensitivity troponin I (hsTnI) values and significant  changes across serial measurements may suggest ACS but many other  chronic and acute conditions are known to elevate hsTnI results.  Refer to the "Links" section for chest pain algorithms and additional  guidance. Performed at [facility], [place] [address] [place], [place] [address]   Comprehensive metabolic panel     Status: None   Collection Time: 08/19/21  5:22 AM  Result Value Ref Range   Sodium 136 135 - 145 mmol/L   Potassium 4.1 3.5 - 5.1 mmol/L   Chloride 104 98 - 111 mmol/L   CO2 26 22 - 32 mmol/L   Glucose, Bld 91 70 - 99 mg/dL    Comment: Glucose reference range applies only to samples taken after fasting for at least 8 hours.   BUN 21 8 - 23 mg/dL   Creatinine, Ser 1.04 0.61 - 1.24 mg/dL   Calcium 9.3 8.9 - 10.3 mg/dL   Total Protein 6.8 6.5 - 8.1 g/dL   Albumin 3.7 3.5 - 5.0 g/dL   AST 17 15 - 41 U/L   ALT 18 0 - 44 U/L   Alkaline Phosphatase 67 38 - 126 U/L   Total Bilirubin 0.7 0.3 - 1.2 mg/dL   GFR, Estimated >60  >60 mL/min    Comment: (NOTE) Calculated using the CKD-EPI Creatinine Equation (2021)    Anion gap 6 5 - 15    Comment: Performed at [facility], [place] [address] [place], [place] [address]  CBC     Status: Abnormal   Collection Time: 08/19/21  5:22 AM  Result Value Ref Range   WBC 7.5 4.0 - 10.5 K/uL   RBC 3.95 (L) 4.22 - 5.81  MIL/uL   Hemoglobin 12.0 (L) 13.0 - 17.0 g/dL   HCT 37.0 (L) 39.0 - 52.0 %   MCV 93.7 80.0 - 100.0 fL   MCH 30.4 26.0 - 34.0 pg   MCHC 32.4 30.0 - 36.0 g/dL   RDW 12.5 11.5 - 15.5 %   Platelets 179 150 - 400 K/uL   nRBC 0.0 0.0 - 0.2 %    Comment: Performed at [facility], [place] [address], [place], [place] [address]   CT Angio Chest PE W/Cm &/Or Wo Cm  Result Date: 08/18/2021 CLINICAL DATA:  Pulmonary embolism (PE) suspected, positive D-dimer. Today while going up and down stairs in his house he developed dyspnea each time. This is new. He has a pulse ox at home and he checked and his heart rate was in the 170s and his oxygen was in the 80s. EXAM: CT ANGIOGRAPHY CHEST WITH CONTRAST TECHNIQUE: Multidetector CT imaging of the chest was performed using the standard protocol during bolus administration of intravenous contrast. Multiplanar CT image reconstructions and MIPs were obtained to evaluate the vascular anatomy. CONTRAST:  21mL OMNIPAQUE IOHEXOL 350 MG/ML SOLN COMPARISON:  None. FINDINGS: Cardiovascular: Satisfactory opacification of the pulmonary arteries to the segmental level. Limited evaluation of the subsegmental level due to motion artifact. No evidence of pulmonary embolism. The main pulmonary artery is normal in caliber. Normal heart size. No significant pericardial effusion. The thoracic aorta is normal in caliber. At least mild atherosclerotic plaque of the thoracic aorta. Four-vessel coronary artery calcifications status post coronary artery bypass graft. Mediastinum/Nodes: No enlarged mediastinal, hilar, or axillary  lymph nodes. Thyroid gland, trachea, and esophagus demonstrate no significant findings. Lungs/Pleura: Interval development of peribronchovascular ground-glass airspace opacities within the left lower lobe. Interval decrease in persistent right lower lobe peribronchovascular ground-glass airspace opacities. Interval increase in site of a left upper lobe 1.1 x 0.8 cm pulmonary nodule (from 0.6 cm). No pulmonary mass. Trace right pleural effusion. No left pleural effusion. No pneumothorax. Upper Abdomen: Splenule noted. Partially visualized fluid density lesion within the left kidney. No acute abnormality. Musculoskeletal: Interval development of soft tissue densities within the right chest wall measuring 1 x 0.8 cm and 1.6 x 1.7 cm (5: 246-252). Interval increase in size of lytic lesions along the posterior cortex of the right eleventh rib (3:557-322) with a nondisplaced pathologic fracture through the rib not excluded (5:255). Interval development of a couple of lytic lesion within the right ninth rib: Along the anterior cortex (5:191) in the posterior cortex through the anterior cortex (5:225). Old healed right eighth rib fracture anterolaterally. Multilevel degenerative changes of the spine. Anterior and interbody C7-T1 fusion with surgical hardware. Review of the MIP images confirms the above findings. IMPRESSION: 1. Interval increase in size and interval development of a new right rib lytic lesions concerning for osseous metastases. A pathologic nondisplaced fracture of the right eleventh rib is not excluded. 2. Interval development of soft tissue densities within the right chest wall measuring 1 x 0.8 cm and 1.6 x 1.7 cm. These are concerning for metastases given right chest wall hypermetabolic activity on PET CT 07/03/2021. 3. Interval increase in site of a left upper lobe 1.1 x 0.8 cm pulmonary nodule (from 0.6 cm). 4. Interval development of peribronchovascular ground-glass airspace opacities within the left  lower lobe. Interval decrease in persistent right lower lobe peribronchovascular ground-glass airspace opacities. Findings suggestive of infection/inflammation. Underlying malignancy not excluded. 5. Trace right pleural effusion. Electronically Signed   By: Iven Finn  M.D.   On: 08/18/2021 20:02   DG Chest Port 1 View  Result Date: 08/18/2021 CLINICAL DATA:  Sepsis EXAM: PORTABLE CHEST 1 VIEW COMPARISON:  Chest x-ray 07/29/2021 FINDINGS: Heart size is normal. Mediastinum appears stable. Calcified plaques in the aortic arch. Cardiac surgical changes and median sternotomy wires. Right-sided central venous port with the tip near the cavoatrial junction. Elevated right hemidiaphragm with chronic stable blunting of the right costophrenic angle. No new consolidation identified. No pleural effusion or pneumothorax visualized. IMPRESSION: Stable chronic changes with no acute process identified. Electronically Signed   By: Ofilia Neas M.D.   On: 08/18/2021 13:13    Pending Labs Unresulted Labs (From admission, onward)     Start     Ordered   08/18/21 2009  Respiratory (~20 pathogens) panel by PCR  (Respiratory panel by PCR (~20 pathogens, ~24 hr TAT)  w precautions)  Add-on,   AD        08/18/21 2008   08/18/21 1220  Blood Culture ID Panel (Reflexed)  Once,   STAT        08/18/21 1220            Vitals/Pain Today's Vitals   08/19/21 1233 08/19/21 1236 08/19/21 1315 08/19/21 1323  BP:   100/63   Pulse: (!) 121 (!) 119 93   Resp: (!) 23 18 18    Temp:   98.2 F (36.8 C)   TempSrc:   Oral   SpO2: (!) 87% 100% 99%   Weight:      Height:      PainSc:   7  7     Isolation Precautions Droplet precaution  Medications Medications  sodium chloride flush (NS) 0.9 % injection 3 mL (3 mLs Intravenous Given 08/19/21 1011)  0.9 %  sodium chloride infusion (0 mLs Intravenous Stopped 08/19/21 0832)  acetaminophen (TYLENOL) tablet 650 mg (has no administration in time range)    Or   acetaminophen (TYLENOL) suppository 650 mg (has no administration in time range)  polyethylene glycol (MIRALAX / GLYCOLAX) packet 17 g (has no administration in time range)  morphine (MS CONTIN) 12 hr tablet 15 mg (15 mg Oral Given 08/19/21 1009)  HYDROmorphone (DILAUDID) tablet 4 mg (4 mg Oral Given 08/19/21 1008)  aspirin EC tablet 81 mg (81 mg Oral Given 08/19/21 1003)  atorvastatin (LIPITOR) tablet 80 mg (80 mg Oral Given 08/19/21 1005)  metoprolol succinate (TOPROL-XL) 24 hr tablet 25 mg (25 mg Oral Given 08/18/21 2156)  ondansetron (ZOFRAN) tablet 8 mg (8 mg Oral Given 08/19/21 0024)  rivaroxaban (XARELTO) tablet 20 mg (20 mg Oral Given 08/18/21 2119)  gabapentin (NEURONTIN) capsule 100 mg (100 mg Oral Given 08/18/21 2118)  ranolazine (RANEXA) 12 hr tablet 1,000 mg (1,000 mg Oral Given 08/19/21 1009)  DULoxetine (CYMBALTA) DR capsule 40 mg (has no administration in time range)  lidocaine (LIDODERM) 5 % 1 patch (1 patch Transdermal Patch Removed 08/19/21 1003)  midodrine (PROAMATINE) tablet 2.5 mg (has no administration in time range)  HYDROmorphone (DILAUDID) injection 0.5-1 mg (1 mg Intravenous Given 08/19/21 1317)  sodium chloride 0.9 % bolus 1,000 mL (0 mLs Intravenous Stopped 08/18/21 1524)  HYDROmorphone (DILAUDID) injection 1 mg (1 mg Intravenous Given 08/18/21 1240)  ondansetron (ZOFRAN) injection 4 mg (4 mg Intravenous Given 08/18/21 1241)  acetaminophen (TYLENOL) tablet 650 mg (650 mg Oral Given 08/18/21 1240)  sodium chloride 0.9 % bolus 1,000 mL (0 mLs Intravenous Stopped 08/18/21 1850)  HYDROmorphone (DILAUDID) injection 1 mg (1 mg Intravenous  Given 08/18/21 1423)  ondansetron (ZOFRAN) injection 4 mg (4 mg Intravenous Given 08/18/21 1518)  HYDROmorphone (DILAUDID) injection 1 mg (1 mg Intravenous Given 08/18/21 1519)  iohexol (OMNIPAQUE) 350 MG/ML injection 80 mL (80 mLs Intravenous Contrast Given 08/18/21 1853)  HYDROmorphone (DILAUDID) injection 0.5-1 mg (1 mg  Intravenous Given 08/19/21 0834)    Mobility walks Low fall risk     R Recommendations: See Admitting Provider Note  Report given to:   Additional Notes:

## 2021-08-19 NOTE — ED Notes (Signed)
Pt in bed, pt has no requests at this time. Reports decreased pain

## 2021-08-20 ENCOUNTER — Ambulatory Visit: Payer: Medicare Other | Admitting: Radiation Oncology

## 2021-08-20 ENCOUNTER — Telehealth: Payer: Self-pay | Admitting: Medical Oncology

## 2021-08-20 DIAGNOSIS — E440 Moderate protein-calorie malnutrition: Secondary | ICD-10-CM | POA: Insufficient documentation

## 2021-08-20 MED ORDER — HYDROMORPHONE HCL 1 MG/ML IJ SOLN
0.5000 mg | INTRAMUSCULAR | Status: AC | PRN
  Administered 2021-08-21 (×2): 0.5 mg via INTRAVENOUS
  Filled 2021-08-20 (×2): qty 0.5

## 2021-08-20 MED ORDER — PROCHLORPERAZINE EDISYLATE 10 MG/2ML IJ SOLN
5.0000 mg | Freq: Once | INTRAMUSCULAR | Status: AC
  Administered 2021-08-20: 01:00:00 5 mg via INTRAVENOUS
  Filled 2021-08-20: qty 2

## 2021-08-20 MED ORDER — HYDROMORPHONE HCL 1 MG/ML IJ SOLN
0.5000 mg | Freq: Once | INTRAMUSCULAR | Status: AC | PRN
  Administered 2021-08-20: 07:00:00 0.5 mg via INTRAVENOUS
  Filled 2021-08-20: qty 0.5

## 2021-08-20 MED ORDER — ENSURE ENLIVE PO LIQD: 237.0000 mL | Freq: Three times a day (TID) | ORAL | Status: DC | PRN

## 2021-08-20 MED ORDER — DOCUSATE SODIUM 100 MG PO CAPS
100.0000 mg | ORAL_CAPSULE | Freq: Every day | ORAL | Status: DC
  Administered 2021-08-20 – 2021-08-23 (×4): 100 mg via ORAL
  Filled 2021-08-20 (×4): qty 1

## 2021-08-20 MED ORDER — ADULT MULTIVITAMIN W/MINERALS CH
1.0000 | ORAL_TABLET | Freq: Every day | ORAL | Status: DC
  Administered 2021-08-20 – 2021-08-23 (×4): 1 via ORAL
  Filled 2021-08-20 (×4): qty 1

## 2021-08-20 NOTE — Progress Notes (Signed)
Initial Nutrition Assessment  DOCUMENTATION CODES:   Non-severe (moderate) malnutrition in context of chronic illness  INTERVENTION:   -Ensure Enlive po PRN TID, each supplement provides 350 kcal and 20 grams of protein  -Multivitamin with minerals daily  NUTRITION DIAGNOSIS:   Moderate Malnutrition related to chronic illness, cancer and cancer related treatments as evidenced by percent weight loss, mild fat depletion, moderate muscle depletion.  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  Patient was recently diagnosed with flu by his family doctor per his report. Pt reports his wife had the flu and COVID the week prior to him getting sick. Prior to getting sick he was eating normally. States his appetite slowly improved after the completion of his chemotherapy in August 2022. Pt was not eating well during his treatments. Pt was diagnosed with severe malnutrition at that time.  Starting on 12/16 pt states he was not eating well and eventually couldn't tolerate water.  His appetite has improved now. Ate cereal, applesauce, fruit cup and chicken sausage for breakfast this morning. States he feels hungry for lunch. Would like Ensure ordered as PRN.   Per weight records, pt has lost 35 lbs since 6/20 (18% wt loss x 6 months,significant for time  frame).  Medications: Colace  Labs reviewed.  NUTRITION - FOCUSED PHYSICAL EXAM:  Flowsheet Row Most Recent Value  Orbital Region Mild depletion  Upper Arm Region Moderate depletion  Thoracic and Lumbar Region No depletion  Buccal Region Mild depletion  Temple Region Moderate depletion  Clavicle Bone Region Mild depletion  Clavicle and Acromion Bone Region Mild depletion  Scapular Bone Region Mild depletion  Dorsal Hand Moderate depletion  Patellar Region Mild depletion  Anterior Thigh Region Mild depletion  Posterior Calf Region Mild depletion  Edema (RD Assessment) None  Hair Unable to assess  [no hair]  Eyes Reviewed  Mouth Reviewed  Skin Reviewed       Diet Order:   Diet Order             Diet Heart Room service appropriate? Yes; Fluid consistency: Thin  Diet effective now                   EDUCATION NEEDS:   No education needs have been identified at this time  Skin:  Skin Assessment: Reviewed RN Assessment  Last BM:  PTA  Height:   Ht Readings from Last 1 Encounters:  08/19/21 5\' 9"  (1.753 m)    Weight:   Wt Readings from Last 1 Encounters:  08/20/21 71.7 kg    BMI:  Body mass index is 23.34 kg/m.  Estimated Nutritional Needs:   Kcal:  2150-2350  Protein:  100-115g  Fluid:  2.1L/day   Clayton Bibles, MS, RD, LDN Inpatient Clinical Dietitian Contact information available via Amion

## 2021-08-20 NOTE — Progress Notes (Signed)
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  Work-up in the ED shows lactic acid elevated 2.5 which improved to 2.1 with IV hydration.  CTA chest ruled out PE.  Patient is admitted for further evaluation and management.  Assessment & Plan:   Principal Problem:   Atrial tachycardia (HCC) Active Problems:   Fibromyalgia   Anxiety   Depression   Mixed bipolar I disorder (HCC)   S/P CABG x 5   CAD of autologous artery bypass graft without angina   Central chest pain   Ectopic atrial tachycardia (HCC)   Cervical radiculopathy   S/P lobectomy of lung   Bipolar disorder (HCC)   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HCC)   Acute renal failure superimposed on stage 3a chronic kidney disease (HCC)   AF (paroxysmal atrial fibrillation) (HCC)   History of pulmonary embolus (PE)   Malnutrition of moderate degree  Flu like symptoms/ Bacteremia?? Patient presented with flulike symptoms, tachycardia, hypoxia. Initial impression was dehydration so patient was given IV hydration. CTA chest shows finding consistent with bone metastasis. Patient does have brain mets and getting started on radiation treatment. Influenza negative, COVID-negative, continue supportive care. 1 bottle blood culture grew staph, sensitivity pending.  Large cell neuroendocrine carcinoma: Patient was diagnosed in 2022, He underwent right upper lobectomy,  chemotherapy,  palliative radiation  secondary to the chest wall lesion. Recently have a new brain lesion noted. Oncologist Dr. [name] was notified. Patient is going to start radiation treatment.  CAD : S/p CABG x 5. Continue aspirin, Lipitor, metoprolol, Ranexa. Troponin negative.  AKI on CKD stage III: Resolved with IV hydration.  Atrial fibrillation: Heart rate now well controlled Continue metoprolol, continue Xarelto.  History of PE: CTA chest ruled out PE but shows finding consistent with mets Continue Xarelto  Anxiety/depression/bipolar: Continue Cymbalta  Chronic pain, fibromyalgia, cancer related pain: Continue gabapentin, since duloxetine, as needed Dilaudid   DVT prophylaxis: Xarelto Code Status: Full code. Family Communication: No family at bed side Disposition Plan:   Status is: Inpatient  Remains inpatient appropriate because: Admitted with new brain lesion possible mets from primary lung cancer. Patient is going to start radiation treatment.  Consultants:   Oncology  Procedures:  CTA chest Antimicrobials:   Anti-infectives (From admission, onward)    None        Subjective: Patient was seen and examined at bedside.  Overnight events noted.   Patient reports being anxious and remains in a lot of pain,  asking for IV pain medications.  Objective: Vitals:   08/20/21 0334 08/20/21 0438 08/20/21 0600 08/20/21 1426  BP: 97/68  99/68 100/70  Pulse: 85   78  Resp: 18   16  Temp: 98.1 F (36.7 C)   97.9 F (36.6 C)  TempSrc: Axillary   Oral  SpO2: 97%   96%  Weight:  71.7 kg    Height:        Intake/Output Summary (Last 24 hours) at 08/20/2021 1431 Last data filed at 08/20/2021 0900 Gross per 24 hour  Intake 240 ml  Output 1025 ml  Net -785 ml   Filed Weights   08/19/21 0700 08/19/21 2049 08/20/21 0438  Weight: 70.3 kg 74.1 kg 71.7 kg    Examination:  General exam: Appears anxious, restless, lying comfortably, not in any acute distress Respiratory system: Clear to  auscultation bilaterally, no wheezing, no crackles. Cardiovascular system: S1 & S2 heard, regular rate and rhythm,  no murmur, Gastrointestinal system: Abdomen is soft, nontender, nondistended, BS+ Central nervous system: Alert and oriented x3. No focal neurological deficits. Extremities: No edema, no cyanosis, no clubbing. Skin: No rashes, lesions or ulcers Psychiatry: Judgement and insight appear normal. Mood & affect appropriate.     Data Reviewed: I have personally reviewed following labs and imaging studies  CBC: Recent Labs  Lab 08/18/21 1211 08/19/21 0522  WBC 8.9 7.5  NEUTROABS 6.3  --   HGB 13.8 12.0*  HCT 42.4 37.0*  MCV 91.8 93.7  PLT 214 096   Basic Metabolic Panel: Recent Labs  Lab 08/18/21 1211 08/18/21 2125 08/19/21 0522  NA 135  --  136  K 4.0  --  4.1  CL 100  --  104  CO2 25  --  26  GLUCOSE 118*  --  91  BUN 29*  --  21  CREATININE 1.36*  --  1.04  CALCIUM 10.3  --  9.3  MG  --  2.3  --    GFR: Estimated Creatinine Clearance: 69.9 mL/min (by C-G formula based on SCr of 1.04 mg/dL). Liver Function Tests: Recent Labs  Lab 08/18/21 1211 08/19/21 0522  AST 21 17  ALT 26 18  ALKPHOS 79 67  BILITOT 0.7 0.7  PROT 8.0 6.8  ALBUMIN 4.3 3.7   No results for input(s): LIPASE, AMYLASE in the last 168 hours. No results for input(s): AMMONIA in the last 168 hours. Coagulation Profile: Recent Labs  Lab 08/18/21 1211  INR 1.0   Cardiac Enzymes: No results for input(s): CKTOTAL, CKMB, CKMBINDEX, TROPONINI in the last 168 hours. BNP (last 3 results) No results for input(s): PROBNP in the last 8760 hours. HbA1C: No results for input(s): HGBA1C in the last 72 hours. CBG: No results for input(s): GLUCAP in the last 168 hours. Lipid Profile: No results for input(s): CHOL, HDL, LDLCALC, TRIG, CHOLHDL, LDLDIRECT in the last 72 hours. Thyroid Function Tests: No results for input(s): TSH, T4TOTAL, FREET4, T3FREE, THYROIDAB in the last 72  hours. Anemia Panel: No results for input(s): VITAMINB12, FOLATE, FERRITIN, TIBC, IRON, RETICCTPCT in the last 72 hours. Sepsis Labs: Recent Labs  Lab 08/18/21 1230 08/18/21 1455  LATICACIDVEN 2.5* 2.1*    Recent Results (from the past 240 hour(s))  Resp Panel by RT-PCR (Flu A&B, Covid)     Status: None   Collection Time: 08/18/21 12:00 PM   Specimen: Nasopharyngeal(NP) swabs in vial transport medium  Result Value Ref Range Status   SARS Coronavirus 2 by RT PCR NEGATIVE NEGATIVE Final    Comment: (NOTE) SARS-CoV-2 target nucleic acids are NOT DETECTED.  The SARS-CoV-2 RNA is generally detectable in upper respiratory specimens during the acute phase of infection. The lowest concentration of SARS-CoV-2 viral copies this assay can detect is 138 copies/mL. A negative result does not preclude SARS-Cov-2 infection and should not be used as the sole basis for treatment or other patient management decisions. A negative result may occur with  improper specimen collection/handling, submission of specimen other than nasopharyngeal swab, presence of viral mutation(s) within the areas targeted by  this assay, and inadequate number of viral copies(<138 copies/mL). A negative result must be combined with clinical observations, patient history, and epidemiological information. The expected result is Negative.  Fact Sheet for Patients:  EntrepreneurPulse.com.au  Fact Sheet for Healthcare Providers:  IncredibleEmployment.be  This test is no t yet approved or cleared by the [place] FDA and  has been authorized for detection and/or diagnosis of SARS-CoV-2 by FDA under an Emergency Use Authorization (EUA). This EUA will remain  in effect (meaning this test can be used) for the duration of the COVID-19 declaration under Section 564(b)(1) of the Act, 21 U.S.C.section 360bbb-3(b)(1), unless the authorization is terminated  or revoked sooner.        Influenza A by PCR NEGATIVE NEGATIVE Final   Influenza B by PCR NEGATIVE NEGATIVE Final    Comment: (NOTE) The Xpert Xpress SARS-CoV-2/FLU/RSV plus assay is intended as an aid in the diagnosis of influenza from Nasopharyngeal swab specimens and should not be used as a sole basis for treatment. Nasal washings and aspirates are unacceptable for Xpert Xpress SARS-CoV-2/FLU/RSV testing.  Fact Sheet for Patients: EntrepreneurPulse.com.au  Fact Sheet for Healthcare Providers: IncredibleEmployment.be  This test is not yet approved or cleared by the [place] FDA and has been authorized for detection and/or diagnosis of SARS-CoV-2 by FDA under an Emergency Use Authorization (EUA). This EUA will remain in effect (meaning this test can be used) for the duration of the COVID-19 declaration under Section 564(b)(1) of the Act, 21 U.S.C. section 360bbb-3(b)(1), unless the authorization is terminated or revoked.  Performed at [facility], [place] [address], [place], [place] [address]   Blood Culture (routine x 2)     Status: None (Preliminary result)   Collection Time: 08/18/21 12:20 PM   Specimen: BLOOD  Result Value Ref Range Status   Specimen Description   Final    BLOOD LEFT ANTECUBITAL Performed at [facility] [address] [place], [place] [address]    Special Requests   Final    BOTTLES DRAWN AEROBIC AND ANAEROBIC Blood Culture results may not be optimal due to an inadequate volume of blood received in culture bottles Performed at [facility] [address], [place], [place] [address]    Culture  Setup Time   Final    GRAM POSITIVE COCCI ANAEROBIC BOTTLE ONLY CRITICAL VALUE NOTED.  VALUE IS CONSISTENT WITH PREVIOUSLY REPORTED AND CALLED VALUE. Performed at [facility], [place] [address], [place], [place] [address]    Culture GRAM POSITIVE COCCI  Final   Report Status PENDING   Incomplete  Blood Culture (routine x 2)     Status: Abnormal (Preliminary result)   Collection Time: 08/18/21 12:20 PM   Specimen: BLOOD  Result Value Ref Range Status   Specimen Description   Final    BLOOD RIGHT ANTECUBITAL Performed at [facility] [address] [place], [place] [address]    Special Requests   Final    BOTTLES DRAWN AEROBIC AND ANAEROBIC Blood Culture adequate volume Performed at [facility] [address] [place], [place] [address]    Culture  Setup Time   Final    GRAM POSITIVE COCCI IN CLUSTERS AEROBIC BOTTLE ONLY Organism ID to follow CRITICAL RESULT CALLED TO, READ BACK BY AND VERIFIED WITH: PHARMD A PHAM [id number] MLM    Culture (A)  Final    STAPHYLOCOCCUS HOMINIS THE SIGNIFICANCE OF ISOLATING THIS ORGANISM FROM A SINGLE SET  OF BLOOD CULTURES WHEN MULTIPLE SETS ARE DRAWN IS UNCERTAIN. PLEASE NOTIFY THE MICROBIOLOGY DEPARTMENT WITHIN ONE WEEK IF SPECIATION AND SENSITIVITIES ARE REQUIRED. Performed at [facility], [place] [address] [place], [place] [address]    Report Status PENDING  Incomplete  Blood Culture ID Panel (Reflexed)     Status: Abnormal   Collection Time: 08/18/21 12:20 PM  Result Value Ref Range Status   Enterococcus faecalis NOT DETECTED NOT DETECTED Final   Enterococcus Faecium NOT DETECTED NOT DETECTED Final   Listeria monocytogenes NOT DETECTED NOT DETECTED Final   Staphylococcus species DETECTED (A) NOT DETECTED Final    Comment: CRITICAL RESULT CALLED TO, READ BACK BY AND VERIFIED WITH: PJHARMD A PHAM [id number] MLM    Staphylococcus aureus (BCID) NOT DETECTED NOT DETECTED Final   Staphylococcus epidermidis NOT DETECTED NOT DETECTED Final   Staphylococcus lugdunensis NOT DETECTED NOT DETECTED Final   Streptococcus species NOT DETECTED NOT DETECTED Final   Streptococcus agalactiae NOT DETECTED NOT DETECTED Final   Streptococcus pneumoniae NOT DETECTED NOT DETECTED Final    Streptococcus pyogenes NOT DETECTED NOT DETECTED Final   A.calcoaceticus-baumannii NOT DETECTED NOT DETECTED Final   Bacteroides fragilis NOT DETECTED NOT DETECTED Final   Enterobacterales NOT DETECTED NOT DETECTED Final   Enterobacter cloacae complex NOT DETECTED NOT DETECTED Final   Escherichia coli NOT DETECTED NOT DETECTED Final   Klebsiella aerogenes NOT DETECTED NOT DETECTED Final   Klebsiella oxytoca NOT DETECTED NOT DETECTED Final   Klebsiella pneumoniae NOT DETECTED NOT DETECTED Final   Proteus species NOT DETECTED NOT DETECTED Final   Salmonella species NOT DETECTED NOT DETECTED Final   Serratia marcescens NOT DETECTED NOT DETECTED Final   Haemophilus influenzae NOT DETECTED NOT DETECTED Final   Neisseria meningitidis NOT DETECTED NOT DETECTED Final   Pseudomonas aeruginosa NOT DETECTED NOT DETECTED Final   Stenotrophomonas maltophilia NOT DETECTED NOT DETECTED Final   Candida albicans NOT DETECTED NOT DETECTED Final   Candida auris NOT DETECTED NOT DETECTED Final   Candida glabrata NOT DETECTED NOT DETECTED Final   Candida krusei NOT DETECTED NOT DETECTED Final   Candida parapsilosis NOT DETECTED NOT DETECTED Final   Candida tropicalis NOT DETECTED NOT DETECTED Final   Cryptococcus neoformans/gattii NOT DETECTED NOT DETECTED Final    Comment: Performed at [facility], [address] [place],  [address]    Radiology Studies: CT Angio Chest PE W/Cm &/Or Wo Cm  Result Date: 08/18/2021 CLINICAL DATA:  Pulmonary embolism (PE) suspected, positive D-dimer. Today while going up and down stairs in his house he developed dyspnea each time. This is new. He has a pulse ox at home and he checked and his heart rate was in the 170s and his oxygen was in the 80s. EXAM: CT ANGIOGRAPHY CHEST WITH CONTRAST TECHNIQUE: Multidetector CT imaging of the chest was performed using the standard protocol during bolus administration of intravenous contrast. Multiplanar CT image  reconstructions and MIPs were obtained to evaluate the vascular anatomy. CONTRAST:  38mL OMNIPAQUE IOHEXOL 350 MG/ML SOLN COMPARISON:  None. FINDINGS: Cardiovascular: Satisfactory opacification of the pulmonary arteries to the segmental level. Limited evaluation of the subsegmental level due to motion artifact. No evidence of pulmonary embolism. The main pulmonary artery is normal in caliber. Normal heart size. No significant pericardial effusion. The thoracic aorta is normal in caliber. At least mild atherosclerotic plaque of the thoracic aorta. Four-vessel coronary artery calcifications status post coronary artery bypass graft. Mediastinum/Nodes: No enlarged mediastinal, hilar, or axillary  lymph nodes. Thyroid gland, trachea, and esophagus demonstrate no significant findings. Lungs/Pleura: Interval development of peribronchovascular ground-glass airspace opacities within the left lower lobe. Interval decrease in persistent right lower lobe peribronchovascular ground-glass airspace opacities. Interval increase in site of a left upper lobe 1.1 x 0.8 cm pulmonary nodule (from 0.6 cm). No pulmonary mass. Trace right pleural effusion. No left pleural effusion. No pneumothorax. Upper Abdomen: Splenule noted. Partially visualized fluid density lesion within the left kidney. No acute abnormality. Musculoskeletal: Interval development of soft tissue densities within the right chest wall measuring 1 x 0.8 cm and 1.6 x 1.7 cm (5: 246-252). Interval increase in size of lytic lesions along the posterior cortex of the right eleventh rib (6:387-564) with a nondisplaced pathologic fracture through the rib not excluded (5:255). Interval development of a couple of lytic lesion within the right ninth rib: Along the anterior cortex (5:191) in the posterior cortex through the anterior cortex (5:225). Old healed right eighth rib fracture anterolaterally. Multilevel degenerative changes of the spine. Anterior and interbody C7-T1 fusion  with surgical hardware. Review of the MIP images confirms the above findings. IMPRESSION: 1. Interval increase in size and interval development of a new right rib lytic lesions concerning for osseous metastases. A pathologic nondisplaced fracture of the right eleventh rib is not excluded. 2. Interval development of soft tissue densities within the right chest wall measuring 1 x 0.8 cm and 1.6 x 1.7 cm. These are concerning for metastases given right chest wall hypermetabolic activity on PET CT 07/03/2021. 3. Interval increase in site of a left upper lobe 1.1 x 0.8 cm pulmonary nodule (from 0.6 cm). 4. Interval development of peribronchovascular ground-glass airspace opacities within the left lower lobe. Interval decrease in persistent right lower lobe peribronchovascular ground-glass airspace opacities. Findings suggestive of infection/inflammation. Underlying malignancy not excluded. 5. Trace right pleural effusion. Electronically Signed   By: Iven Finn M.D.   On: 08/18/2021 20:02    Scheduled Meds:  aspirin EC  81 mg Oral Daily   atorvastatin  80 mg Oral Daily   Chlorhexidine Gluconate Cloth  6 each Topical Q0600   docusate sodium  100 mg Oral Daily   DULoxetine  40 mg Oral q1800   gabapentin  100 mg Oral QHS   lidocaine  1 patch Transdermal Q24H   metoprolol succinate  25 mg Oral QPM   morphine  15 mg Oral Q12H   multivitamin with minerals  1 tablet Oral Daily   ranolazine  1,000 mg Oral BID   rivaroxaban  20 mg Oral Q supper   sodium chloride flush  3 mL Intravenous Q12H   Continuous Infusions:   LOS: 1 day    Time spent: 25 min    Diamonte Stavely, MD [facility]   If 7PM-7AM, please contact night-coverage

## 2021-08-20 NOTE — Telephone Encounter (Signed)
Per Dr. [name] , I told pt he will get xrt first ,then Dr. [name] may consider chemotherapy.

## 2021-08-21 ENCOUNTER — Ambulatory Visit
Admission: RE | Admit: 2021-08-21 | Discharge: 2021-08-21 | Disposition: A | Discharge status: Home / Self Care | Payer: Medicare Other | Source: Ambulatory Visit | Attending: Radiation Oncology | Admitting: Radiation Oncology

## 2021-08-21 ENCOUNTER — Ambulatory Visit
Admit: 2021-08-21 | Discharge: 2021-08-21 | Disposition: A | Discharge status: Home / Self Care | Payer: Medicare Other | Attending: Radiation Oncology | Admitting: Radiation Oncology

## 2021-08-21 LAB — BASIC METABOLIC PANEL
Anion gap: 6 (ref 5–15)
BUN: 22 mg/dL (ref 8–23)
CO2: 26 mmol/L (ref 22–32)
Calcium: 9.4 mg/dL (ref 8.9–10.3)
Chloride: 102 mmol/L (ref 98–111)
Creatinine, Ser: 1.21 mg/dL (ref 0.61–1.24)
GFR, Estimated: >60 mL/min (ref >60)
Glucose, Bld: 93 mg/dL (ref 70–99)
Potassium: 4.3 mmol/L (ref 3.5–5.1)
Sodium: 134 mmol/L — ABNORMAL LOW (ref 135–145)

## 2021-08-21 LAB — CBC
HCT: 34.7 % — ABNORMAL LOW (ref 39.0–52.0)
Hemoglobin: 11.5 g/dL — ABNORMAL LOW (ref 13.0–17.0)
MCH: 30.5 pg (ref 26.0–34.0)
MCHC: 33.1 g/dL (ref 30.0–36.0)
MCV: 92 fL (ref 80.0–100.0)
Platelets: 176 10*3/uL (ref 150–400)
RBC: 3.77 MIL/uL — ABNORMAL LOW (ref 4.22–5.81)
RDW: 12.2 % (ref 11.5–15.5)
WBC: 7 10*3/uL (ref 4.0–10.5)
nRBC: 0 % (ref 0.0–0.2)

## 2021-08-21 LAB — PHOSPHORUS: Phosphorus: 3.8 mg/dL (ref 2.5–4.6)

## 2021-08-21 LAB — MAGNESIUM: Magnesium: 2 mg/dL (ref 1.7–2.4)

## 2021-08-21 MED ORDER — SODIUM CHLORIDE 0.9% FLUSH
10.0000 mL | INTRAVENOUS | Status: DC | PRN
  Administered 2021-08-21 – 2021-08-22 (×2): 10 mL

## 2021-08-21 MED ORDER — HYDROMORPHONE HCL 1 MG/ML IJ SOLN
0.5000 mg | Freq: Once | INTRAMUSCULAR | Status: AC
  Administered 2021-08-21: 0.5 mg via INTRAVENOUS
  Filled 2021-08-21: qty 0.5

## 2021-08-21 MED ORDER — ONDANSETRON HCL 4 MG/2ML IJ SOLN
4.0000 mg | Freq: Four times a day (QID) | INTRAMUSCULAR | Status: DC | PRN
  Administered 2021-08-21 – 2021-08-22 (×2): 4 mg via INTRAVENOUS
  Filled 2021-08-21 (×2): qty 2

## 2021-08-21 NOTE — Plan of Care (Signed)
Pt appears to be orthstatic hypotensive. Light headed when changing positions x3. MD paged. Midodrine given x1. Pt educated on orthostatic hypotension to change positions slowly and to call for assistance OOB. Pt dissatisfied with pain regimen. Pt had first radiation today.   Problem: Education: Goal: Knowledge of General Education information will improve Description: Including pain rating scale, medication(s)/side effects and non-pharmacologic comfort measures Outcome: Progressing   Problem: Health Behavior/Discharge Planning: Goal: Ability to manage health-related needs will improve Outcome: Progressing   Problem: Clinical Measurements: Goal: Ability to maintain clinical measurements within normal limits will improve Outcome: Progressing Goal: Will remain free from infection Outcome: Progressing Goal: Diagnostic test results will improve Outcome: Progressing Goal: Respiratory complications will improve Outcome: Progressing Goal: Cardiovascular complication will be avoided Outcome: Progressing   Problem: Activity: Goal: Risk for activity intolerance will decrease Outcome: Progressing   Problem: Nutrition: Goal: Adequate nutrition will be maintained Outcome: Progressing   Problem: Coping: Goal: Level of anxiety will decrease Outcome: Progressing   Problem: Elimination: Goal: Will not experience complications related to bowel motility Outcome: Progressing Goal: Will not experience complications related to urinary retention Outcome: Progressing   Problem: Pain Managment: Goal: General experience of comfort will improve Outcome: Progressing   Problem: Safety: Goal: Ability to remain free from injury will improve Outcome: Progressing   Problem: Skin Integrity: Goal: Risk for impaired skin integrity will decrease Outcome: Progressing

## 2021-08-21 NOTE — Progress Notes (Signed)
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  Work-up in the ED shows lactic acid elevated 2.5 which improved to 2.1 with IV hydration.  CTA chest ruled out PE.  Patient is admitted for further evaluation and management.  Assessment & Plan:   Principal Problem:   Atrial tachycardia (HCC) Active Problems:   Fibromyalgia   Anxiety   Depression   Mixed bipolar I disorder (HCC)   S/P CABG x 5   CAD of autologous artery bypass graft without angina   Central chest pain   Ectopic atrial tachycardia (HCC)   Cervical radiculopathy   S/P lobectomy of lung   Bipolar disorder (HCC)   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HCC)   Acute renal failure superimposed on stage 3a chronic kidney disease (HCC)   AF (paroxysmal atrial fibrillation) (HCC)   History of pulmonary embolus (PE)   Malnutrition of moderate degree  Flu like symptoms/ Bacteremia?? Patient presented with flulike symptoms, tachycardia, hypoxia. Initial impression was dehydration so patient was given IV hydration. CTA chest shows finding consistent with bone metastasis. Patient does have brain mets and getting started on radiation treatment. Influenza negative, COVID-negative, continue supportive care. blood culture grew staph Hominis, sensitivity pending.  Seems contaminated. Repeat blood cultures.  Patient remains afebrile.  Hold on antibiotics for now.  Large cell neuroendocrine carcinoma: Patient  was diagnosed in 2022, He underwent right upper lobectomy,  chemotherapy,  palliative radiation secondary to the chest wall lesion. Recently have a new brain lesion noted. Oncologist Dr. [name] was notified. Patient is going to start radiation treatment.  CAD : S/p CABG x 5. Continue aspirin, Lipitor, metoprolol, Ranexa. Troponin negative.  AKI on CKD stage III: Resolved with IV hydration.  Atrial fibrillation: Heart rate now well controlled. Continue metoprolol, continue Xarelto.  History of PE: CTA chest ruled out PE but shows finding consistent with mets Continue Xarelto  Anxiety/depression/bipolar: Continue Cymbalta  Chronic pain, fibromyalgia, cancer related pain: Continue gabapentin, since duloxetine, as needed Dilaudid   DVT prophylaxis: Xarelto Code Status: Full code. Family Communication: No family at bed side Disposition Plan:   Status is: Inpatient  Remains inpatient appropriate because: Admitted with new brain lesion possible mets from primary lung cancer. Patient is going to start radiation treatment.  Blood cultures grew Staph hominis.  Repeat blood cultures  Consultants:   Oncology  Procedures:  CTA chest Antimicrobials:   Anti-infectives (From admission, onward)    None        Subjective: Patient was seen and examined at bedside.  Overnight events noted.   Patient reports feeling better, states the pain is not controlled, asking continuously for IV Dilaudid.  Objective: Vitals:   08/21/21 0410 08/21/21 1042 08/21/21 1235 08/21/21 1237  BP:  96/67 (!) 87/62 101/71  Pulse:   89 91  Resp:   16   Temp:   98.2 F (36.8 C)   TempSrc:   Oral   SpO2:   95% 96%  Weight: 73.7 kg     Height:  Intake/Output Summary (Last 24 hours) at 08/21/2021 1422 Last data filed at 08/21/2021 0900 Gross per 24 hour  Intake 480 ml  Output 700 ml  Net -220 ml   Filed Weights   08/19/21 2049 08/20/21 0438 08/21/21 0410  Weight: 74.1 kg 71.7 kg 73.7  kg    Examination:  General exam: Appears comfortable, not in any acute distress, anxious Respiratory system: Clear to auscultation bilaterally, no wheezing, no crackles. Cardiovascular system: S1 & S2 heard, regular rate and rhythm,  no murmur, Gastrointestinal system: Abdomen is soft, non tender, non distended, BS+ Central nervous system: Alert and oriented x3. No focal neurological deficits. Extremities: No edema, no cyanosis, no clubbing. Skin: No rashes, lesions or ulcers Psychiatry: Judgement and insight appear normal. Mood & affect appropriate.     Data Reviewed: I have personally reviewed following labs and imaging studies  CBC: Recent Labs  Lab 08/18/21 1211 08/19/21 0522 08/21/21 0628  WBC 8.9 7.5 7.0  NEUTROABS 6.3  --   --   HGB 13.8 12.0* 11.5*  HCT 42.4 37.0* 34.7*  MCV 91.8 93.7 92.0  PLT 214 179 194   Basic Metabolic Panel: Recent Labs  Lab 08/18/21 1211 08/18/21 2125 08/19/21 0522 08/21/21 0628  NA 135  --  136 134*  K 4.0  --  4.1 4.3  CL 100  --  104 102  CO2 25  --  26 26  GLUCOSE 118*  --  91 93  BUN 29*  --  21 22  CREATININE 1.36*  --  1.04 1.21  CALCIUM 10.3  --  9.3 9.4  MG  --  2.3  --  2.0  PHOS  --   --   --  3.8   GFR: Estimated Creatinine Clearance: 60.1 mL/min (by C-G formula based on SCr of 1.21 mg/dL). Liver Function Tests: Recent Labs  Lab 08/18/21 1211 08/19/21 0522  AST 21 17  ALT 26 18  ALKPHOS 79 67  BILITOT 0.7 0.7  PROT 8.0 6.8  ALBUMIN 4.3 3.7   No results for input(s): LIPASE, AMYLASE in the last 168 hours. No results for input(s): AMMONIA in the last 168 hours. Coagulation Profile: Recent Labs  Lab 08/18/21 1211  INR 1.0   Cardiac Enzymes: No results for input(s): CKTOTAL, CKMB, CKMBINDEX, TROPONINI in the last 168 hours. BNP (last 3 results) No results for input(s): PROBNP in the last 8760 hours. HbA1C: No results for input(s): HGBA1C in the last 72 hours. CBG: No results for input(s): GLUCAP in  the last 168 hours. Lipid Profile: No results for input(s): CHOL, HDL, LDLCALC, TRIG, CHOLHDL, LDLDIRECT in the last 72 hours. Thyroid Function Tests: No results for input(s): TSH, T4TOTAL, FREET4, T3FREE, THYROIDAB in the last 72 hours. Anemia Panel: No results for input(s): VITAMINB12, FOLATE, FERRITIN, TIBC, IRON, RETICCTPCT in the last 72 hours. Sepsis Labs: Recent Labs  Lab 08/18/21 1230 08/18/21 1455  LATICACIDVEN 2.5* 2.1*    Recent Results (from the past 240 hour(s))  Resp Panel by RT-PCR (Flu A&B, Covid)     Status: None   Collection Time: 08/18/21 12:00 PM   Specimen: Nasopharyngeal(NP) swabs in vial transport medium  Result Value Ref Range Status   SARS Coronavirus 2 by RT PCR NEGATIVE NEGATIVE Final    Comment: (NOTE) SARS-CoV-2 target nucleic acids are NOT DETECTED.  The SARS-CoV-2 RNA is generally detectable in upper respiratory specimens during the acute phase of infection. The lowest concentration of SARS-CoV-2 viral copies this assay can detect is  138 copies/mL. A negative result does not preclude SARS-Cov-2 infection and should not be used as the sole basis for treatment or other patient management decisions. A negative result may occur with  improper specimen collection/handling, submission of specimen other than nasopharyngeal swab, presence of viral mutation(s) within the areas targeted by this assay, and inadequate number of viral copies(<138 copies/mL). A negative result must be combined with clinical observations, patient history, and epidemiological information. The expected result is Negative.  Fact Sheet for Patients:  EntrepreneurPulse.com.au  Fact Sheet for Healthcare Providers:  IncredibleEmployment.be  This test is no t yet approved or cleared by the [place] FDA and  has been authorized for detection and/or diagnosis of SARS-CoV-2 by FDA under an Emergency Use Authorization (EUA). This EUA will remain   in effect (meaning this test can be used) for the duration of the COVID-19 declaration under Section 564(b)(1) of the Act, 21 U.S.C.section 360bbb-3(b)(1), unless the authorization is terminated  or revoked sooner.       Influenza A by PCR NEGATIVE NEGATIVE Final   Influenza B by PCR NEGATIVE NEGATIVE Final    Comment: (NOTE) The Xpert Xpress SARS-CoV-2/FLU/RSV plus assay is intended as an aid in the diagnosis of influenza from Nasopharyngeal swab specimens and should not be used as a sole basis for treatment. Nasal washings and aspirates are unacceptable for Xpert Xpress SARS-CoV-2/FLU/RSV testing.  Fact Sheet for Patients: EntrepreneurPulse.com.au  Fact Sheet for Healthcare Providers: IncredibleEmployment.be  This test is not yet approved or cleared by the [place] FDA and has been authorized for detection and/or diagnosis of SARS-CoV-2 by FDA under an Emergency Use Authorization (EUA). This EUA will remain in effect (meaning this test can be used) for the duration of the COVID-19 declaration under Section 564(b)(1) of the Act, 21 U.S.C. section 360bbb-3(b)(1), unless the authorization is terminated or revoked.  Performed at [facility], [place] [address], [place], [place] [address]   Blood Culture (routine x 2)     Status: Abnormal (Preliminary result)   Collection Time: 08/18/21 12:20 PM   Specimen: BLOOD  Result Value Ref Range Status   Specimen Description   Final    BLOOD LEFT ANTECUBITAL Performed at [place] [address], [place], [place] [address]    Special Requests   Final    BOTTLES DRAWN AEROBIC AND ANAEROBIC Blood Culture results may not be optimal due to an inadequate volume of blood received in culture bottles Performed at [place] [address] [place], [place] [address]    Culture  Setup Time   Final    GRAM POSITIVE COCCI ANAEROBIC BOTTLE  ONLY CRITICAL VALUE NOTED.  VALUE IS CONSISTENT WITH PREVIOUSLY REPORTED AND CALLED VALUE.    Culture (A)  Final    STAPHYLOCOCCUS HOMINIS SUSCEPTIBILITIES TO FOLLOW Performed at [facility], [place] [address] [place], [place] [address]    Report Status PENDING  Incomplete  Blood Culture (routine x 2)     Status: Abnormal (Preliminary result)   Collection Time: 08/18/21 12:20 PM   Specimen: BLOOD  Result Value Ref Range Status   Specimen Description   Final    BLOOD RIGHT ANTECUBITAL Performed at [place] [address], [place], [place] [address]    Special Requests   Final    BOTTLES DRAWN AEROBIC AND ANAEROBIC Blood Culture adequate volume Performed at [place] [address] [place],  [address]    Culture  Setup Time   Final    GRAM POSITIVE COCCI IN CLUSTERS AEROBIC BOTTLE ONLY Organism ID to follow CRITICAL RESULT CALLED TO, READ BACK BY AND VERIFIED WITH: PHARMD A PHAM [phone] MLM    Culture (A)  Final    STAPHYLOCOCCUS HOMINIS CULTURE REINCUBATED FOR BETTER GROWTH Performed at [facility], [place] [address], [place], [place] [address]    Report Status PENDING  Incomplete  Blood Culture ID Panel (Reflexed)     Status: Abnormal   Collection Time: 08/18/21 12:20 PM  Result Value Ref Range Status   Enterococcus faecalis NOT DETECTED NOT DETECTED Final   Enterococcus Faecium NOT DETECTED NOT DETECTED Final   Listeria monocytogenes NOT DETECTED NOT DETECTED Final   Staphylococcus species DETECTED (A) NOT DETECTED Final    Comment: CRITICAL RESULT CALLED TO, READ BACK BY AND VERIFIED WITH: PJHARMD A PHAM [phone] MLM    Staphylococcus aureus (BCID) NOT DETECTED NOT DETECTED Final   Staphylococcus epidermidis NOT DETECTED NOT DETECTED Final   Staphylococcus lugdunensis NOT DETECTED NOT DETECTED Final   Streptococcus species NOT DETECTED NOT DETECTED Final   Streptococcus agalactiae NOT DETECTED NOT  DETECTED Final   Streptococcus pneumoniae NOT DETECTED NOT DETECTED Final   Streptococcus pyogenes NOT DETECTED NOT DETECTED Final   A.calcoaceticus-baumannii NOT DETECTED NOT DETECTED Final   Bacteroides fragilis NOT DETECTED NOT DETECTED Final   Enterobacterales NOT DETECTED NOT DETECTED Final   Enterobacter cloacae complex NOT DETECTED NOT DETECTED Final   Escherichia coli NOT DETECTED NOT DETECTED Final   Klebsiella aerogenes NOT DETECTED NOT DETECTED Final   Klebsiella oxytoca NOT DETECTED NOT DETECTED Final   Klebsiella pneumoniae NOT DETECTED NOT DETECTED Final   Proteus species NOT DETECTED NOT DETECTED Final   Salmonella species NOT DETECTED NOT DETECTED Final   Serratia marcescens NOT DETECTED NOT DETECTED Final   Haemophilus influenzae NOT DETECTED NOT DETECTED Final   Neisseria meningitidis NOT DETECTED NOT DETECTED Final   Pseudomonas aeruginosa NOT DETECTED NOT DETECTED Final   Stenotrophomonas maltophilia NOT DETECTED NOT DETECTED Final   Candida albicans NOT DETECTED NOT DETECTED Final   Candida auris NOT DETECTED NOT DETECTED Final   Candida glabrata NOT DETECTED NOT DETECTED Final   Candida krusei NOT DETECTED NOT DETECTED Final   Candida parapsilosis NOT DETECTED NOT DETECTED Final   Candida tropicalis NOT DETECTED NOT DETECTED Final   Cryptococcus neoformans/gattii NOT DETECTED NOT DETECTED Final    Comment: Performed at [facility], [address], [place], [place] [address]    Radiology Studies: No results found.  Scheduled Meds:  aspirin EC  81 mg Oral Daily   atorvastatin  80 mg Oral Daily   Chlorhexidine Gluconate Cloth  6 each Topical Q0600   docusate sodium  100 mg Oral Daily   DULoxetine  40 mg Oral q1800   gabapentin  100 mg Oral QHS   lidocaine  1 patch Transdermal Q24H   metoprolol succinate  25 mg Oral QPM   morphine  15 mg Oral Q12H   multivitamin with minerals  1 tablet Oral Daily   ranolazine  1,000 mg Oral BID   rivaroxaban  20 mg  Oral Q supper   sodium chloride flush  3 mL Intravenous Q12H   Continuous Infusions:   LOS: 2 days    Time spent: 25 min    Jaizon Deroos, MD [facility]   If 7PM-7AM, please contact night-coverage

## 2021-08-21 NOTE — Care Management Important Message (Signed)
Important Message  Patient Details IM Letter given to the Patient. Name: [name] MRN: [id number] Date of Birth: 10/19/1955   Medicare Important Message Given:  Yes     Kerin Salen 08/21/2021, 1:31 PM

## 2021-08-22 LAB — CULTURE, BLOOD (ROUTINE X 2): Special Requests: ADEQUATE

## 2021-08-22 MED ORDER — HYDROMORPHONE HCL 2 MG PO TABS
4.0000 mg | ORAL_TABLET | ORAL | Status: DC | PRN
  Administered 2021-08-22 – 2021-08-23 (×5): 4 mg via ORAL
  Filled 2021-08-22 (×4): qty 2

## 2021-08-22 MED ORDER — PROMETHAZINE HCL 25 MG PO TABS
12.5000 mg | ORAL_TABLET | Freq: Four times a day (QID) | ORAL | Status: DC | PRN
  Administered 2021-08-22 – 2021-08-23 (×3): 12.5 mg via ORAL
  Filled 2021-08-22 (×3): qty 1

## 2021-08-22 NOTE — Progress Notes (Signed)
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  Work-up in the ED shows lactic acid elevated 2.5 which improved to 2.1 with IV hydration.  CTA chest ruled out PE.  Patient is admitted for further evaluation and management.  Assessment & Plan:   Principal Problem:   Atrial tachycardia (HCC) Active Problems:   Fibromyalgia   Anxiety   Depression   Mixed bipolar I disorder (HCC)   S/P CABG x 5   CAD of autologous artery bypass graft without angina   Central chest pain   Ectopic atrial tachycardia (HCC)   Cervical radiculopathy   S/P lobectomy of lung   Bipolar disorder (HCC)   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HCC)   Acute renal failure superimposed on stage 3a chronic kidney disease (HCC)   AF (paroxysmal atrial fibrillation) (HCC)   History of pulmonary embolus (PE)   Malnutrition of moderate degree  Flu like symptoms/ Bacteremia?? Patient presented with flu like symptoms, tachycardia, hypoxia. Initial impression was dehydration so patient was given IV hydration. CTA chest showed finding consistent with bone metastasis. Patient does have brain mets and getting started on radiation treatment. Influenza negative, COVID-negative, Continue supportive care. blood culture grew staph Hominis, sensitivity pending.  Seems contaminated. Repeat blood cultures.  Patient remains afebrile.  Hold on antibiotics for now.  Large cell neuroendocrine  carcinoma: Patient was diagnosed in 2022, He underwent right upper lobectomy,  chemotherapy,  palliative radiation secondary to the chest wall lesion. Recently have a new brain lesion noted. Oncologist Dr. [name] was notified. Patient is going to start radiation treatment as outpatient.  CAD : S/p CABG x 5. Continue aspirin, Lipitor, metoprolol, Ranexa. Troponin negative.  AKI on CKD stage III: Resolved with IV hydration.  Atrial fibrillation: Heart rate now well controlled. Continue metoprolol, Continue Xarelto.  History of PE: CTA chest ruled out PE but shows finding consistent with mets Continue Xarelto  Anxiety/depression/bipolar: Continue Cymbalta  Chronic pain, fibromyalgia, cancer related pain: Continue gabapentin, since duloxetine, as needed Dilaudid   DVT prophylaxis: Xarelto Code Status: Full code. Family Communication: No family at bed side Disposition Plan:   Status is: Inpatient  Remains inpatient appropriate because: Admitted with new brain lesion possible mets from primary lung cancer. Patient is going to start radiation treatment.  Blood cultures grew Staph hominis.  Repeat blood cultures  Consultants:   Oncology  Procedures:  CTA chest Antimicrobials:   Anti-infectives (From admission, onward)    None        Subjective: Patient was seen and examined at bedside.  Overnight events noted.   Patient states the pain is not controlled, asking continuously for IV Dilaudid. Patient remains afebrile, states pain is manageable but asks to increase the pain medication.  Objective: Vitals:   08/21/21 1954 08/22/21 0423 08/22/21 0424 08/22/21 1107  BP: 97/61  105/67 108/71  Pulse: 72  72 68  Resp: 20  20 18   Temp: 98 F (36.7 C)  97.8 F (36.6 C) 98.2 F (36.8 C)  TempSrc: Oral  Oral Oral  SpO2: 95%  99% 95%  Weight:  74.4 kg    Height:        Intake/Output Summary (Last 24 hours) at 08/22/2021 1402 Last data filed at 08/22/2021 0800 Gross  per 24 hour  Intake 360 ml  Output 150 ml  Net 210 ml   Filed Weights   08/20/21 0438 08/21/21 0410 08/22/21 0423  Weight: 71.7 kg 73.7 kg 74.4 kg    Examination:  General exam: Appears comfortable, anxious, not in any acute distress. Respiratory system: Clear to auscultation bilaterally, no wheezing, no crackles. Cardiovascular system: S1 & S2 heard, regular rate and rhythm,  no murmur, Gastrointestinal system: Abdomen is soft, non tender, non distended, BS+ Central nervous system: Alert and oriented x 3. No focal neurological deficits. Extremities: No edema, no cyanosis, no clubbing. Skin: No rashes, lesions or ulcers Psychiatry: Judgement and insight appear normal. Mood & affect appropriate.     Data Reviewed: I have personally reviewed following labs and imaging studies  CBC: Recent Labs  Lab 08/18/21 1211 08/19/21 0522 08/21/21 0628  WBC 8.9 7.5 7.0  NEUTROABS 6.3  --   --   HGB 13.8 12.0* 11.5*  HCT 42.4 37.0* 34.7*  MCV 91.8 93.7 92.0  PLT 214 179 790   Basic Metabolic Panel: Recent Labs  Lab 08/18/21 1211 08/18/21 2125 08/19/21 0522 08/21/21 0628  NA 135  --  136 134*  K 4.0  --  4.1 4.3  CL 100  --  104 102  CO2 25  --  26 26  GLUCOSE 118*  --  91 93  BUN 29*  --  21 22  CREATININE 1.36*  --  1.04 1.21  CALCIUM 10.3  --  9.3 9.4  MG  --  2.3  --  2.0  PHOS  --   --   --  3.8   GFR: Estimated Creatinine Clearance: 60.1 mL/min (by C-G formula based on SCr of 1.21 mg/dL). Liver Function Tests: Recent Labs  Lab 08/18/21 1211 08/19/21 0522  AST 21 17  ALT 26 18  ALKPHOS 79 67  BILITOT 0.7 0.7  PROT 8.0 6.8  ALBUMIN 4.3 3.7   No results for input(s): LIPASE, AMYLASE in the last 168 hours. No results for input(s): AMMONIA in the last 168 hours. Coagulation Profile: Recent Labs  Lab 08/18/21 1211  INR 1.0   Cardiac Enzymes: No results for input(s): CKTOTAL, CKMB, CKMBINDEX, TROPONINI in the last 168 hours. BNP (last 3 results) No  results for input(s): PROBNP in the last 8760 hours. HbA1C: No results for input(s): HGBA1C in the last 72 hours. CBG: No results for input(s): GLUCAP in the last 168 hours. Lipid Profile: No results for input(s): CHOL, HDL, LDLCALC, TRIG, CHOLHDL, LDLDIRECT in the last 72 hours. Thyroid Function Tests: No results for input(s): TSH, T4TOTAL, FREET4, T3FREE, THYROIDAB in the last 72 hours. Anemia Panel: No results for input(s): VITAMINB12, FOLATE, FERRITIN, TIBC, IRON, RETICCTPCT in the last 72 hours. Sepsis Labs: Recent Labs  Lab 08/18/21 1230 08/18/21 1455  LATICACIDVEN 2.5* 2.1*    Recent Results (from the past 240 hour(s))  Resp Panel by RT-PCR (Flu A&B, Covid)     Status: None   Collection Time: 08/18/21 12:00 PM   Specimen: Nasopharyngeal(NP) swabs in vial transport medium  Result Value Ref Range Status   SARS Coronavirus 2 by RT PCR NEGATIVE NEGATIVE Final    Comment: (NOTE) SARS-CoV-2 target nucleic acids are NOT DETECTED.  The SARS-CoV-2 RNA is generally detectable  in upper respiratory specimens during the acute phase of infection. The lowest concentration of SARS-CoV-2 viral copies this assay can detect is 138 copies/mL. A negative result does not preclude SARS-Cov-2 infection and should not be used as the sole basis for treatment or other patient management decisions. A negative result may occur with  improper specimen collection/handling, submission of specimen other than nasopharyngeal swab, presence of viral mutation(s) within the areas targeted by this assay, and inadequate number of viral copies(<138 copies/mL). A negative result must be combined with clinical observations, patient history, and epidemiological information. The expected result is Negative.  Fact Sheet for Patients:  EntrepreneurPulse.com.au  Fact Sheet for Healthcare Providers:  IncredibleEmployment.be  This test is no t yet approved or cleared by the  [place] FDA and  has been authorized for detection and/or diagnosis of SARS-CoV-2 by FDA under an Emergency Use Authorization (EUA). This EUA will remain  in effect (meaning this test can be used) for the duration of the COVID-19 declaration under Section 564(b)(1) of the Act, 21 U.S.C.section 360bbb-3(b)(1), unless the authorization is terminated  or revoked sooner.       Influenza A by PCR NEGATIVE NEGATIVE Final   Influenza B by PCR NEGATIVE NEGATIVE Final    Comment: (NOTE) The Xpert Xpress SARS-CoV-2/FLU/RSV plus assay is intended as an aid in the diagnosis of influenza from Nasopharyngeal swab specimens and should not be used as a sole basis for treatment. Nasal washings and aspirates are unacceptable for Xpert Xpress SARS-CoV-2/FLU/RSV testing.  Fact Sheet for Patients: EntrepreneurPulse.com.au  Fact Sheet for Healthcare Providers: IncredibleEmployment.be  This test is not yet approved or cleared by the [place] FDA and has been authorized for detection and/or diagnosis of SARS-CoV-2 by FDA under an Emergency Use Authorization (EUA). This EUA will remain in effect (meaning this test can be used) for the duration of the COVID-19 declaration under Section 564(b)(1) of the Act, 21 U.S.C. section 360bbb-3(b)(1), unless the authorization is terminated or revoked.  Performed at [facility], [place] [address] [place], [place] [address]   Blood Culture (routine x 2)     Status: Abnormal (Preliminary result)   Collection Time: 08/18/21 12:20 PM   Specimen: BLOOD  Result Value Ref Range Status   Specimen Description   Final    BLOOD LEFT ANTECUBITAL Performed at [facility] [address], [place], [place] [address]    Special Requests   Final    BOTTLES DRAWN AEROBIC AND ANAEROBIC Blood Culture results may not be optimal due to an inadequate volume of blood received in culture  bottles Performed at [facility] [address] [place], [place] [address]    Culture  Setup Time   Final    GRAM POSITIVE COCCI ANAEROBIC BOTTLE ONLY CRITICAL VALUE NOTED.  VALUE IS CONSISTENT WITH PREVIOUSLY REPORTED AND CALLED VALUE.    Culture (A)  Final    STAPHYLOCOCCUS HOMINIS SUSCEPTIBILITIES TO FOLLOW Performed at [facility], [place] [address] [place], [place] [address]    Report Status PENDING  Incomplete  Blood Culture (routine x 2)     Status: Abnormal (Preliminary result)   Collection Time: 08/18/21 12:20 PM   Specimen: BLOOD  Result Value Ref Range Status   Specimen Description   Final    BLOOD RIGHT ANTECUBITAL Performed at [facility] [address] [place], [place] [address]    Special Requests   Final    BOTTLES DRAWN AEROBIC AND  ANAEROBIC Blood Culture adequate volume Performed at [facility] [address], [place], [place] [address]    Culture  Setup Time   Final    GRAM POSITIVE COCCI IN CLUSTERS AEROBIC BOTTLE ONLY Organism ID to follow CRITICAL RESULT CALLED TO, READ BACK BY AND VERIFIED WITH: PHARMD A PHAM [id number] MLM    Culture (A)  Final    STAPHYLOCOCCUS HOMINIS CULTURE REINCUBATED FOR BETTER GROWTH Performed at [facility], [place] [address], [place], [place] [address]    Report Status PENDING  Incomplete  Blood Culture ID Panel (Reflexed)     Status: Abnormal   Collection Time: 08/18/21 12:20 PM  Result Value Ref Range Status   Enterococcus faecalis NOT DETECTED NOT DETECTED Final   Enterococcus Faecium NOT DETECTED NOT DETECTED Final   Listeria monocytogenes NOT DETECTED NOT DETECTED Final   Staphylococcus species DETECTED (A) NOT DETECTED Final    Comment: CRITICAL RESULT CALLED TO, READ BACK BY AND VERIFIED WITH: PJHARMD A PHAM [id number] MLM    Staphylococcus aureus (BCID) NOT DETECTED NOT DETECTED Final   Staphylococcus epidermidis NOT DETECTED NOT  DETECTED Final   Staphylococcus lugdunensis NOT DETECTED NOT DETECTED Final   Streptococcus species NOT DETECTED NOT DETECTED Final   Streptococcus agalactiae NOT DETECTED NOT DETECTED Final   Streptococcus pneumoniae NOT DETECTED NOT DETECTED Final   Streptococcus pyogenes NOT DETECTED NOT DETECTED Final   A.calcoaceticus-baumannii NOT DETECTED NOT DETECTED Final   Bacteroides fragilis NOT DETECTED NOT DETECTED Final   Enterobacterales NOT DETECTED NOT DETECTED Final   Enterobacter cloacae complex NOT DETECTED NOT DETECTED Final   Escherichia coli NOT DETECTED NOT DETECTED Final   Klebsiella aerogenes NOT DETECTED NOT DETECTED Final   Klebsiella oxytoca NOT DETECTED NOT DETECTED Final   Klebsiella pneumoniae NOT DETECTED NOT DETECTED Final   Proteus species NOT DETECTED NOT DETECTED Final   Salmonella species NOT DETECTED NOT DETECTED Final   Serratia marcescens NOT DETECTED NOT DETECTED Final   Haemophilus influenzae NOT DETECTED NOT DETECTED Final   Neisseria meningitidis NOT DETECTED NOT DETECTED Final   Pseudomonas aeruginosa NOT DETECTED NOT DETECTED Final   Stenotrophomonas maltophilia NOT DETECTED NOT DETECTED Final   Candida albicans NOT DETECTED NOT DETECTED Final   Candida auris NOT DETECTED NOT DETECTED Final   Candida glabrata NOT DETECTED NOT DETECTED Final   Candida krusei NOT DETECTED NOT DETECTED Final   Candida parapsilosis NOT DETECTED NOT DETECTED Final   Candida tropicalis NOT DETECTED NOT DETECTED Final   Cryptococcus neoformans/gattii NOT DETECTED NOT DETECTED Final    Comment: Performed at [facility], [address] [place], [place] [address]  Culture, blood (Routine X 2) w Reflex to ID Panel     Status: None (Preliminary result)   Collection Time: 08/21/21  4:39 PM   Specimen: BLOOD  Result Value Ref Range Status   Specimen Description   Final    BLOOD RIGHT ANTECUBITAL Performed at [facility], [place] [address] [place], [place] [address]     Special Requests   Final    BOTTLES DRAWN AEROBIC ONLY Blood Culture adequate volume Performed at [facility] [address] [place], [place] [address]    Culture   Final    NO GROWTH < 24 HOURS Performed at [facility] [address], [place], [place] [address]    Report Status PENDING  Incomplete  Culture, blood (Routine X 2) w Reflex to ID  Panel     Status: None (Preliminary result)   Collection Time: 08/21/21  4:39 PM   Specimen: BLOOD RIGHT HAND  Result Value Ref Range Status   Specimen Description   Final    BLOOD RIGHT HAND Performed at [facility] [address], [place], [place] [address]    Special Requests   Final    BOTTLES DRAWN AEROBIC ONLY Blood Culture adequate volume Performed at [facility] [address] [place], [place] [address]    Culture   Final    NO GROWTH < 24 HOURS Performed at [facility] [address] [place], [place] [address]    Report Status PENDING  Incomplete    Radiology Studies: No results found.  Scheduled Meds:  aspirin EC  81 mg Oral Daily   atorvastatin  80 mg Oral Daily   Chlorhexidine Gluconate Cloth  6 each Topical Q0600   docusate sodium  100 mg Oral Daily   DULoxetine  40 mg Oral q1800   gabapentin  100 mg Oral QHS   lidocaine  1 patch Transdermal Q24H   metoprolol succinate  25 mg Oral QPM   morphine  15 mg Oral Q12H   multivitamin with minerals  1 tablet Oral Daily   ranolazine  1,000 mg Oral BID   rivaroxaban  20 mg Oral Q supper   sodium chloride flush  3 mL Intravenous Q12H   Continuous Infusions:   LOS: 3 days    Time spent: 25 min    Jade Burkard, MD [facility]   If 7PM-7AM, please contact night-coverage

## 2021-08-23 MED ORDER — HEPARIN SOD (PORK) LOCK FLUSH 100 UNIT/ML IV SOLN: 500.0000 [IU] | INTRAVENOUS | Status: DC | PRN

## 2021-08-23 NOTE — Discharge Summary (Addendum)
Physician Discharge Summary  AHREN PETTINGER NID:[id number] DOB: [birth year] DOA: 08/18/2021  PCP: [name], MD  Admit date: 08/18/2021  Discharge date: 08/23/2021  Admitted From: Home.  Disposition:  Home.  Recommendations for Outpatient Follow-up:  Follow up with PCP in 1-2 weeks. Please obtain BMP/CBC in one week.. Advised to follow-up with radiation oncology as per schedule for radiation treatment. Advised to follow-up with oncologist as per schedule.  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  Work-up in the ED shows lactic acid elevated 2.5 which improved to 2.1 with IV hydration. CTA chest ruled out PE.  Patient was admitted for further evaluation and management. Patient's blood cultures growing Staph hominis which could be considered contaminant.  Repeat blood cultures which showed no growth to date.  Oncologist was consulted.  Patient's radiation treatment was set up but will be started outpatient.  Patient was continued on pain medications.  He reports feeling better.  Patient want to be discharged.  Patient is being discharged home   He was managed for below problem   Discharge Diagnoses:  Principal Problem:   Atrial tachycardia ([place]) Active Problems:   Fibromyalgia   Anxiety   Depression   Mixed bipolar I disorder (HCC)   S/P CABG x 5   CAD of autologous artery bypass graft without  angina   Central chest pain   Ectopic atrial tachycardia (HCC)   Cervical radiculopathy   S/P lobectomy of lung   Bipolar disorder (HCC)   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HCC)   Acute renal failure superimposed on stage 3a chronic kidney disease (HCC)   AF (paroxysmal atrial fibrillation) (HCC)   History of pulmonary embolus (PE)   Malnutrition of moderate degree    Flu like symptoms/ Bacteremia?? Patient presented with flulike symptoms, tachycardia, hypoxia. Initial impression was dehydration so patient was given IV hydration. CTA chest shows finding consistent with bone metastasis. Patient does have brain mets and getting started on radiation treatment. Influenza negative, COVID-negative, continue supportive care. blood culture grew staph Hominis, sensitivity pending.  Seems contaminated. Repeat blood cultures NGTD x 48 hrs.  Patient remains afebrile.  Hold on antibiotics for now.   Large cell neuroendocrine carcinoma: Patient was diagnosed in 2022, He underwent right upper lobectomy,  chemotherapy,  palliative radiation secondary to the chest wall lesion. Recently have a new brain lesion noted. Oncologist Dr. [name] was notified. Patient is going to start radiation treatment as an outpatient.   CAD : S/p CABG x 5. Continue aspirin, Lipitor, metoprolol, Ranexa. Troponin negative.   AKI on CKD stage III: Resolved with IV hydration.   Atrial fibrillation: Heart rate now well controlled. Continue metoprolol, continue Xarelto.   History of PE: CTA chest ruled out PE but shows finding consistent with mets Continue Xarelto   Anxiety/depression/bipolar: Continue Cymbalta   Chronic pain, fibromyalgia, cancer related pain: Continue gabapentin, since duloxetine, as needed Dilaudid    Discharge Instructions  Discharge Instructions     Call MD for:  difficulty breathing, headache or visual disturbances   Complete  by: As directed    Call MD for:  persistant dizziness or  light-headedness   Complete by: As directed    Call MD for:  persistant nausea and vomiting   Complete by: As directed    Diet - low sodium heart healthy   Complete by: As directed    Diet Carb Modified   Complete by: As directed    Discharge instructions   Complete by: As directed    Advised to follow-up with primary care physician in 1 week. Advised to follow-up with radiation oncology as per schedule for radiation treatment Advised to follow-up with oncologist as per schedule.   Increase activity slowly   Complete by: As directed       Allergies as of 08/23/2021       Reactions   Indomethacin Other (See Comments)   rectal bleeding   Prednisone Other (See Comments)   States that this med makes him "crazy"   Tetanus Toxoids Swelling, Other (See Comments)   Fever, Swelling of the arm    Wellbutrin [bupropion] Other (See Comments)   Crazy thoughts, nightmares   Gabapentin    Other reaction(s): Unknown   Varenicline Other (See Comments)   Dreams Other reaction(s): Unknown        Medication List     STOP taking these medications    oseltamivir 75 MG capsule Commonly known as: TAMIFLU       TAKE these medications    acetaminophen 325 MG tablet Commonly known as: TYLENOL Take 2 tablets (650 mg total) by mouth 3 (three) times daily.   aspirin EC 81 MG tablet Take 81 mg by mouth daily. Swallow whole.   atorvastatin 80 MG tablet Commonly known as: LIPITOR TAKE 1 TABLET(80 MG) BY MOUTH DAILY What changed: See the new instructions.   docusate sodium 100 MG capsule Commonly known as: COLACE Take 100 mg by mouth daily as needed for mild constipation. 1-2   DULoxetine HCl 40 MG Cpep Take 40 mg by mouth every evening.   ferrous gluconate 324 MG tablet Commonly known as: FERGON Take 1 tablet (324 mg total) by mouth daily with breakfast.   gabapentin 100 MG capsule Commonly known as: NEURONTIN Take 1 capsule (100 mg total) by mouth at bedtime.    HYDROmorphone 4 MG tablet Commonly known as: DILAUDID Take 1 tablet (4 mg total) by mouth every 4 (four) hours as needed for moderate pain.   lidocaine 5 % Commonly known as: Lidoderm Place 1 patch onto the skin daily. Remove & Discard patch within 12 hours or as directed by MD What changed:  when to take this reasons to take this   lidocaine-prilocaine cream Commonly known as: EMLA Apply 1 application topically once as needed (port access). What changed: when to take this   metoprolol succinate 25 MG 24 hr tablet Commonly known as: TOPROL-XL Take 1 tablet (25 mg total) by mouth daily. What changed: when to take this   midodrine 2.5 MG tablet Commonly known as: PROAMATINE Take 1 tablet (2.5 mg total) by mouth 3 (three) times daily with meals. What changed:  when to take this reasons to take this   morphine 15 MG 12 hr tablet Commonly known as: MS CONTIN Take 1 tablet (15 mg total) by mouth every 12 (twelve) hours.   multivitamin with minerals Tabs tablet Take 1 tablet by mouth daily.   nitroGLYCERIN 0.4 MG SL tablet Commonly known as: NITROSTAT PLACE 1 TABLET UNDER THE TONGUE EVERY 5 MINUTES AS  NEEDED FOR CHEST PAIN. 3 DOSES MAX What changed:  how much to take when to take this reasons to take this additional instructions   ondansetron 8 MG tablet Commonly known as: ZOFRAN Take 1 tablet (8 mg total) by mouth every 8 (eight) hours as needed for nausea or vomiting. Starting 3 days after chemotherapy   prochlorperazine 10 MG tablet Commonly known as: COMPAZINE Take 1 tablet (10 mg total) by mouth every 6 (six) hours as needed. What changed: reasons to take this   ranolazine 1000 MG SR tablet Commonly known as: RANEXA Take 1 tablet (1,000 mg total) by mouth 2 (two) times daily.   Xarelto 20 MG Tabs tablet Generic drug: rivaroxaban Take 20 mg by mouth every evening.         Follow-up Information     [name], MD Follow up in 1 week(s).    Specialty: Family Medicine Contact information: [address] [place] [address] [phone]         [name], MD .   Specialties: Cardiology, Radiology, Interventional Cardiology Contact information: [address] [address] [phone]         Curt Bears, MD Follow up in 1 week(s).   Specialty: Oncology Contact information: [place] [address] [phone]                Allergies  Allergen Reactions   Indomethacin Other (See Comments)    rectal bleeding   Prednisone Other (See Comments)    States that this med makes him "crazy"   Tetanus Toxoids Swelling and Other (See Comments)    Fever, Swelling of the arm    Wellbutrin [Bupropion] Other (See Comments)    Crazy thoughts, nightmares   Gabapentin     Other reaction(s): Unknown   Varenicline Other (See Comments)    Dreams  Other reaction(s): Unknown    Consultations: Oncology   Procedures/Studies: DG Chest 2 View  Result Date: 07/29/2021 CLINICAL DATA:  Preop for bronchoscopy with valve removal later today. Ex-smoker. Cardiomyopathy. EXAM: CHEST - 2 VIEW COMPARISON:  PET of 10 scratch a PET 07/03/2021. Chest radiograph 06/26/2021 FINDINGS: Cervical spine fixation.  Median sternotomy for CABG. Midline trachea. Right Port-A-Cath tip at superior caval/atrial junction. Right-sided endobronchial valves again identified. Mild right hemidiaphragm elevation with mild right pleural thickening. No pneumothorax. Volume loss at the right lung base. No lobar consolidation. IMPRESSION: No acute process or interval change compared to 06/26/2021. Electronically Signed   By: Abigail Miyamoto M.D.   On: 07/29/2021 13:09   CT Angio Chest PE W/Cm &/Or Wo Cm  Result Date: 08/18/2021 CLINICAL DATA:  Pulmonary embolism (PE) suspected, positive D-dimer. Today while going up and down stairs in his house he developed dyspnea each time. This  is new. He has a pulse ox at home and he checked and his heart rate was in the 170s and his oxygen was in the 80s. EXAM: CT ANGIOGRAPHY CHEST WITH CONTRAST TECHNIQUE: Multidetector CT imaging of the chest was performed using the standard protocol during bolus administration of intravenous contrast. Multiplanar CT image reconstructions and MIPs were obtained to evaluate the vascular anatomy. CONTRAST:  4mL OMNIPAQUE IOHEXOL 350 MG/ML SOLN COMPARISON:  None. FINDINGS: Cardiovascular: Satisfactory opacification of the pulmonary arteries to the segmental level. Limited evaluation of the subsegmental level due to motion artifact. No evidence of pulmonary embolism. The main pulmonary artery is normal in caliber. Normal heart size. No significant  pericardial effusion. The thoracic aorta is normal in caliber. At least mild atherosclerotic plaque of the thoracic aorta. Four-vessel coronary artery calcifications status post coronary artery bypass graft. Mediastinum/Nodes: No enlarged mediastinal, hilar, or axillary lymph nodes. Thyroid gland, trachea, and esophagus demonstrate no significant findings. Lungs/Pleura: Interval development of peribronchovascular ground-glass airspace opacities within the left lower lobe. Interval decrease in persistent right lower lobe peribronchovascular ground-glass airspace opacities. Interval increase in site of a left upper lobe 1.1 x 0.8 cm pulmonary nodule (from 0.6 cm). No pulmonary mass. Trace right pleural effusion. No left pleural effusion. No pneumothorax. Upper Abdomen: Splenule noted. Partially visualized fluid density lesion within the left kidney. No acute abnormality. Musculoskeletal: Interval development of soft tissue densities within the right chest wall measuring 1 x 0.8 cm and 1.6 x 1.7 cm (5: 246-252). Interval increase in size of lytic lesions along the posterior cortex of the right eleventh rib (5:364-680) with a nondisplaced pathologic fracture through the rib not  excluded (5:255). Interval development of a couple of lytic lesion within the right ninth rib: Along the anterior cortex (5:191) in the posterior cortex through the anterior cortex (5:225). Old healed right eighth rib fracture anterolaterally. Multilevel degenerative changes of the spine. Anterior and interbody C7-T1 fusion with surgical hardware. Review of the MIP images confirms the above findings. IMPRESSION: 1. Interval increase in size and interval development of a new right rib lytic lesions concerning for osseous metastases. A pathologic nondisplaced fracture of the right eleventh rib is not excluded. 2. Interval development of soft tissue densities within the right chest wall measuring 1 x 0.8 cm and 1.6 x 1.7 cm. These are concerning for metastases given right chest wall hypermetabolic activity on PET CT 07/03/2021. 3. Interval increase in site of a left upper lobe 1.1 x 0.8 cm pulmonary nodule (from 0.6 cm). 4. Interval development of peribronchovascular ground-glass airspace opacities within the left lower lobe. Interval decrease in persistent right lower lobe peribronchovascular ground-glass airspace opacities. Findings suggestive of infection/inflammation. Underlying malignancy not excluded. 5. Trace right pleural effusion. Electronically Signed   By: Iven Finn M.D.   On: 08/18/2021 20:02   MR Brain W Wo Contrast  Result Date: 08/15/2021 CLINICAL DATA:  Follow-up S RS treated brain mass. Metastatic lung cancer. EXAM: MRI HEAD WITHOUT AND WITH CONTRAST TECHNIQUE: Multiplanar, multiecho pulse sequences of the brain and surrounding structures were obtained without and with intravenous contrast. CONTRAST:  27mL MULTIHANCE GADOBENATE DIMEGLUMINE 529 MG/ML IV SOLN COMPARISON:  05/15/2021.  02/04/2021. FINDINGS: Brain: Diffusion imaging does not show any acute or subacute infarction or other cause of restricted diffusion. No abnormality affects the brainstem. Focus of hemosiderin deposition with  punctate enhancement in the left cerebellum is unchanged since the prior study, consistent with an appearance suggesting successful treatment of the metastasis seen in June of this year. Punctate metastasis previously seen at the left frontoparietal vertex is no longer visible, as on the immediate prior exam. 7 mm in diameter new enhancing metastasis at left frontal vertex, axial image 132. 5 mm in diameter new enhancing metastasis in the left parietal lobe axial image 120. 6 mm in diameter new enhancing metastasis at the right posterior frontal vertex axial image 133. 3-4 mm in diameter new enhancing metastasis along the surface of the brain in the deep insula on the right axial image 72. No hydrocephalus or extra-axial collection. Vascular: Major vessels at the base of the brain show flow. Skull and upper cervical spine: Negative Sinuses/Orbits: Small chronic cyst at  the superolateral corner of the left maxillary sinus as seen previously orbits negative. Other: None IMPRESSION: 4 new metastases identified as enumerated above, in the left frontal vertex, left parietal lobe, right posterior frontal vertex and right deep insula. Continued good appearance of the previously treated left cerebellar lesion. No visible residua with respect to a punctate infarction previously seen at the left frontoparietal vertex. Electronically Signed   By: Nelson Chimes M.D.   On: 08/15/2021 16:03   DG Chest Port 1 View  Result Date: 08/18/2021 CLINICAL DATA:  Sepsis EXAM: PORTABLE CHEST 1 VIEW COMPARISON:  Chest x-ray 07/29/2021 FINDINGS: Heart size is normal. Mediastinum appears stable. Calcified plaques in the aortic arch. Cardiac surgical changes and median sternotomy wires. Right-sided central venous port with the tip near the cavoatrial junction. Elevated right hemidiaphragm with chronic stable blunting of the right costophrenic angle. No new consolidation identified. No pleural effusion or pneumothorax visualized. IMPRESSION:  Stable chronic changes with no acute process identified. Electronically Signed   By: Ofilia Neas M.D.   On: 08/18/2021 13:13     Subjective: Patient was seen and examined at bedside.  Overnight events noted.   Patient reports feeling much improved.  Patient wants to be discharged.  Patient is being discharged home  Discharge Exam: Vitals:   08/22/21 1730 08/22/21 2214  BP: 108/70 96/62  Pulse: 69 83  Resp:  20  Temp:  98.1 F (36.7 C)  SpO2:  95%   Vitals:   08/22/21 1107 08/22/21 1730 08/22/21 2214 08/23/21 0500  BP: 108/71 108/70 96/62   Pulse: 68 69 83   Resp: 18  20   Temp: 98.2 F (36.8 C)  98.1 F (36.7 C)   TempSrc: Oral  Oral   SpO2: 95%  95%   Weight:    75.2 kg  Height:        General: Pt is alert, awake, not in acute distress Cardiovascular: RRR, S1/S2 +, no rubs, no gallops Respiratory: CTA bilaterally, no wheezing, no rhonchi Abdominal: Soft, NT, ND, bowel sounds + Extremities: no edema, no cyanosis    The results of significant diagnostics from this hospitalization (including imaging, microbiology, ancillary and laboratory) are listed below for reference.     Microbiology: Recent Results (from the past 240 hour(s))  Resp Panel by RT-PCR (Flu A&B, Covid)     Status: None   Collection Time: 08/18/21 12:00 PM   Specimen: Nasopharyngeal(NP) swabs in vial transport medium  Result Value Ref Range Status   SARS Coronavirus 2 by RT PCR NEGATIVE NEGATIVE Final    Comment: (NOTE) SARS-CoV-2 target nucleic acids are NOT DETECTED.  The SARS-CoV-2 RNA is generally detectable in upper respiratory specimens during the acute phase of infection. The lowest concentration of SARS-CoV-2 viral copies this assay can detect is 138 copies/mL. A negative result does not preclude SARS-Cov-2 infection and should not be used as the sole basis for treatment or other patient management decisions. A negative result may occur with  improper specimen collection/handling,  submission of specimen other than nasopharyngeal swab, presence of viral mutation(s) within the areas targeted by this assay, and inadequate number of viral copies(<138 copies/mL). A negative result must be combined with clinical observations, patient history, and epidemiological information. The expected result is Negative.  Fact Sheet for Patients:  EntrepreneurPulse.com.au  Fact Sheet for Healthcare Providers:  IncredibleEmployment.be  This test is no t yet approved or cleared by the [place] FDA and  has been authorized for detection and/or diagnosis of  SARS-CoV-2 by FDA under an Emergency Use Authorization (EUA). This EUA will remain  in effect (meaning this test can be used) for the duration of the COVID-19 declaration under Section 564(b)(1) of the Act, 21 U.S.C.section 360bbb-3(b)(1), unless the authorization is terminated  or revoked sooner.       Influenza A by PCR NEGATIVE NEGATIVE Final   Influenza B by PCR NEGATIVE NEGATIVE Final    Comment: (NOTE) The Xpert Xpress SARS-CoV-2/FLU/RSV plus assay is intended as an aid in the diagnosis of influenza from Nasopharyngeal swab specimens and should not be used as a sole basis for treatment. Nasal washings and aspirates are unacceptable for Xpert Xpress SARS-CoV-2/FLU/RSV testing.  Fact Sheet for Patients: EntrepreneurPulse.com.au  Fact Sheet for Healthcare Providers: IncredibleEmployment.be  This test is not yet approved or cleared by the [place] FDA and has been authorized for detection and/or diagnosis of SARS-CoV-2 by FDA under an Emergency Use Authorization (EUA). This EUA will remain in effect (meaning this test can be used) for the duration of the COVID-19 declaration under Section 564(b)(1) of the Act, 21 U.S.C. section 360bbb-3(b)(1), unless the authorization is terminated or revoked.  Performed at [facility], [place] [address], [place], [place] [address]   Blood Culture (routine x 2)     Status: Abnormal   Collection Time: 08/18/21 12:20 PM   Specimen: BLOOD  Result Value Ref Range Status   Specimen Description   Final    BLOOD LEFT ANTECUBITAL Performed at [facility] [address] [place], [place] [address]    Special Requests   Final    BOTTLES DRAWN AEROBIC AND ANAEROBIC Blood Culture results may not be optimal due to an inadequate volume of blood received in culture bottles Performed at [facility] [address] [place], [place] [address]    Culture  Setup Time   Final    GRAM POSITIVE COCCI ANAEROBIC BOTTLE ONLY CRITICAL VALUE NOTED.  VALUE IS CONSISTENT WITH PREVIOUSLY REPORTED AND CALLED VALUE.    Culture (A)  Final    STAPHYLOCOCCUS HOMINIS STAPHYLOCOCCUS EPIDERMIDIS THE SIGNIFICANCE OF ISOLATING THIS ORGANISM FROM A SINGLE SET OF BLOOD CULTURES WHEN MULTIPLE SETS ARE DRAWN IS UNCERTAIN. PLEASE NOTIFY THE MICROBIOLOGY DEPARTMENT WITHIN ONE WEEK IF SPECIATION AND SENSITIVITIES ARE REQUIRED. Performed at [facility], [place] [address], [place], [place] [address]    Report Status 08/22/2021 FINAL  Final   Organism ID, Bacteria STAPHYLOCOCCUS HOMINIS  Final      Susceptibility   Staphylococcus hominis - MIC*    CIPROFLOXACIN <=0.5 SENSITIVE Sensitive     ERYTHROMYCIN >=8 RESISTANT Resistant     GENTAMICIN <=0.5 SENSITIVE Sensitive     OXACILLIN <=0.25 SENSITIVE Sensitive     TETRACYCLINE <=1 SENSITIVE Sensitive     VANCOMYCIN <=0.5 SENSITIVE Sensitive     TRIMETH/SULFA <=10 SENSITIVE Sensitive     CLINDAMYCIN <=0.25 SENSITIVE Sensitive     RIFAMPIN <=0.5 SENSITIVE Sensitive     Inducible Clindamycin NEGATIVE Sensitive     * STAPHYLOCOCCUS HOMINIS  Blood Culture (routine x 2)     Status: Abnormal   Collection Time: 08/18/21 12:20 PM   Specimen: BLOOD  Result Value Ref Range Status   Specimen Description   Final     BLOOD RIGHT ANTECUBITAL Performed at [facility] [address], [place],  [address]    Special Requests   Final    BOTTLES DRAWN AEROBIC AND ANAEROBIC Blood Culture adequate volume  Performed at [facility], [place] [address] [place], [place] [address]    Culture  Setup Time   Final    GRAM POSITIVE COCCI IN CLUSTERS AEROBIC BOTTLE ONLY CRITICAL RESULT CALLED TO, READ BACK BY AND VERIFIED WITH: PHARMD A PHAM [phone] MLM    Culture (A)  Final    STAPHYLOCOCCUS HOMINIS SUSCEPTIBILITIES PERFORMED ON PREVIOUS CULTURE WITHIN THE LAST 5 DAYS. Performed at [facility], [place] [address], [place], [place] [address]    Report Status 08/22/2021 FINAL  Final  Blood Culture ID Panel (Reflexed)     Status: Abnormal   Collection Time: 08/18/21 12:20 PM  Result Value Ref Range Status   Enterococcus faecalis NOT DETECTED NOT DETECTED Final   Enterococcus Faecium NOT DETECTED NOT DETECTED Final   Listeria monocytogenes NOT DETECTED NOT DETECTED Final   Staphylococcus species DETECTED (A) NOT DETECTED Final    Comment: CRITICAL RESULT CALLED TO, READ BACK BY AND VERIFIED WITH: PJHARMD A PHAM [phone] MLM    Staphylococcus aureus (BCID) NOT DETECTED NOT DETECTED Final   Staphylococcus epidermidis NOT DETECTED NOT DETECTED Final   Staphylococcus lugdunensis NOT DETECTED NOT DETECTED Final   Streptococcus species NOT DETECTED NOT DETECTED Final   Streptococcus agalactiae NOT DETECTED NOT DETECTED Final   Streptococcus pneumoniae NOT DETECTED NOT DETECTED Final   Streptococcus pyogenes NOT DETECTED NOT DETECTED Final   A.calcoaceticus-baumannii NOT DETECTED NOT DETECTED Final   Bacteroides fragilis NOT DETECTED NOT DETECTED Final   Enterobacterales NOT DETECTED NOT DETECTED Final   Enterobacter cloacae complex NOT DETECTED NOT DETECTED Final   Escherichia coli NOT DETECTED NOT DETECTED Final   Klebsiella aerogenes NOT DETECTED NOT DETECTED  Final   Klebsiella oxytoca NOT DETECTED NOT DETECTED Final   Klebsiella pneumoniae NOT DETECTED NOT DETECTED Final   Proteus species NOT DETECTED NOT DETECTED Final   Salmonella species NOT DETECTED NOT DETECTED Final   Serratia marcescens NOT DETECTED NOT DETECTED Final   Haemophilus influenzae NOT DETECTED NOT DETECTED Final   Neisseria meningitidis NOT DETECTED NOT DETECTED Final   Pseudomonas aeruginosa NOT DETECTED NOT DETECTED Final   Stenotrophomonas maltophilia NOT DETECTED NOT DETECTED Final   Candida albicans NOT DETECTED NOT DETECTED Final   Candida auris NOT DETECTED NOT DETECTED Final   Candida glabrata NOT DETECTED NOT DETECTED Final   Candida krusei NOT DETECTED NOT DETECTED Final   Candida parapsilosis NOT DETECTED NOT DETECTED Final   Candida tropicalis NOT DETECTED NOT DETECTED Final   Cryptococcus neoformans/gattii NOT DETECTED NOT DETECTED Final    Comment: Performed at [facility], [address] [place], [place] [address]  Culture, blood (Routine X 2) w Reflex to ID Panel     Status: None (Preliminary result)   Collection Time: 08/21/21  4:39 PM   Specimen: BLOOD  Result Value Ref Range Status   Specimen Description   Final    BLOOD RIGHT ANTECUBITAL Performed at [facility], [place] [address] [address], [place], [place] [address]    Special Requests   Final    BOTTLES DRAWN AEROBIC ONLY Blood Culture adequate volume Performed at [facility] [address], [place], [place] [address]    Culture   Final    NO GROWTH < 24 HOURS Performed at [facility] [address] [place], [place] [address]    Report Status PENDING  Incomplete  Culture, blood (Routine X 2) w Reflex to ID Panel  Status: None (Preliminary result)   Collection Time: 08/21/21  4:39 PM   Specimen: BLOOD RIGHT HAND  Result Value Ref Range Status   Specimen Description   Final    BLOOD RIGHT HAND Performed at [facility] [address] [place], [place] [address]    Special Requests   Final    BOTTLES DRAWN AEROBIC ONLY Blood Culture adequate volume Performed at [facility] [address], [place], [place] [address]    Culture   Final    NO GROWTH < 24 HOURS Performed at [facility] [address], [place], [place] [address]    Report Status PENDING  Incomplete     Labs: BNP (last 3 results) Recent Labs    04/28/21 1751 06/04/21 0835 06/06/21 1736  BNP 95.2 53.0 50.5   Basic Metabolic Panel: Recent Labs  Lab 08/18/21 1211 08/18/21 2125 08/19/21 0522 08/21/21 0628  NA 135  --  136 134*  K 4.0  --  4.1 4.3  CL 100  --  104 102  CO2 25  --  26 26  GLUCOSE 118*  --  91 93  BUN 29*  --  21 22  CREATININE 1.36*  --  1.04 1.21  CALCIUM 10.3  --  9.3 9.4  MG  --  2.3  --  2.0  PHOS  --   --   --  3.8   Liver Function Tests: Recent Labs  Lab 08/18/21 1211 08/19/21 0522  AST 21 17  ALT 26 18  ALKPHOS 79 67  BILITOT 0.7 0.7  PROT 8.0 6.8  ALBUMIN 4.3 3.7   No results for input(s): LIPASE, AMYLASE in the last 168 hours. No results for input(s): AMMONIA in the last 168 hours. CBC: Recent Labs  Lab 08/18/21 1211 08/19/21 0522 08/21/21 0628  WBC 8.9 7.5 7.0  NEUTROABS 6.3  --   --   HGB 13.8 12.0* 11.5*  HCT 42.4 37.0* 34.7*  MCV 91.8 93.7 92.0  PLT 214 179 176   Cardiac Enzymes: No results for input(s): CKTOTAL, CKMB, CKMBINDEX, TROPONINI in the last 168 hours. BNP: Invalid input(s): POCBNP CBG: No results for input(s): GLUCAP in the last 168 hours. D-Dimer No results for input(s): DDIMER in the last 72 hours. Hgb A1c No results for input(s): HGBA1C in the last 72 hours. Lipid Profile No results for input(s): CHOL, HDL, LDLCALC, TRIG, CHOLHDL, LDLDIRECT in the last 72 hours. Thyroid function studies No results for input(s): TSH, T4TOTAL, T3FREE, THYROIDAB in the last 72 hours.  Invalid input(s): FREET3 Anemia work up No results for input(s):  VITAMINB12, FOLATE, FERRITIN, TIBC, IRON, RETICCTPCT in the last 72 hours. Urinalysis    Component Value Date/Time   COLORURINE YELLOW 05/05/2021 0508   APPEARANCEUR CLEAR 05/05/2021 0508   LABSPEC 1.010 05/05/2021 0508   PHURINE 6.0 05/05/2021 0508   GLUCOSEU NEGATIVE 05/05/2021 0508   HGBUR NEGATIVE 05/05/2021 0508   BILIRUBINUR NEGATIVE 05/05/2021 0508   KETONESUR NEGATIVE 05/05/2021 0508   PROTEINUR NEGATIVE 05/05/2021 0508   UROBILINOGEN 0.2 05/13/2014 0658   NITRITE NEGATIVE 05/05/2021 0508   LEUKOCYTESUR NEGATIVE 05/05/2021 0508   Sepsis Labs Invalid input(s): PROCALCITONIN,  WBC,  LACTICIDVEN Microbiology Recent Results (from the past 240 hour(s))  Resp Panel by RT-PCR (Flu A&B, Covid)     Status: None   Collection Time: 08/18/21 12:00 PM   Specimen: Nasopharyngeal(NP) swabs in vial transport medium  Result Value Ref Range Status   SARS  Coronavirus 2 by RT PCR NEGATIVE NEGATIVE Final    Comment: (NOTE) SARS-CoV-2 target nucleic acids are NOT DETECTED.  The SARS-CoV-2 RNA is generally detectable in upper respiratory specimens during the acute phase of infection. The lowest concentration of SARS-CoV-2 viral copies this assay can detect is 138 copies/mL. A negative result does not preclude SARS-Cov-2 infection and should not be used as the sole basis for treatment or other patient management decisions. A negative result may occur with  improper specimen collection/handling, submission of specimen other than nasopharyngeal swab, presence of viral mutation(s) within the areas targeted by this assay, and inadequate number of viral copies(<138 copies/mL). A negative result must be combined with clinical observations, patient history, and epidemiological information. The expected result is Negative.  Fact Sheet for Patients:  EntrepreneurPulse.com.au  Fact Sheet for Healthcare Providers:  IncredibleEmployment.be  This test is no t yet  approved or cleared by the [place] FDA and  has been authorized for detection and/or diagnosis of SARS-CoV-2 by FDA under an Emergency Use Authorization (EUA). This EUA will remain  in effect (meaning this test can be used) for the duration of the COVID-19 declaration under Section 564(b)(1) of the Act, 21 U.S.C.section 360bbb-3(b)(1), unless the authorization is terminated  or revoked sooner.       Influenza A by PCR NEGATIVE NEGATIVE Final   Influenza B by PCR NEGATIVE NEGATIVE Final    Comment: (NOTE) The Xpert Xpress SARS-CoV-2/FLU/RSV plus assay is intended as an aid in the diagnosis of influenza from Nasopharyngeal swab specimens and should not be used as a sole basis for treatment. Nasal washings and aspirates are unacceptable for Xpert Xpress SARS-CoV-2/FLU/RSV testing.  Fact Sheet for Patients: EntrepreneurPulse.com.au  Fact Sheet for Healthcare Providers: IncredibleEmployment.be  This test is not yet approved or cleared by the [place] FDA and has been authorized for detection and/or diagnosis of SARS-CoV-2 by FDA under an Emergency Use Authorization (EUA). This EUA will remain in effect (meaning this test can be used) for the duration of the COVID-19 declaration under Section 564(b)(1) of the Act, 21 U.S.C. section 360bbb-3(b)(1), unless the authorization is terminated or revoked.  Performed at [facility], [place] [address] [place], [place] [address]   Blood Culture (routine x 2)     Status: Abnormal   Collection Time: 08/18/21 12:20 PM   Specimen: BLOOD  Result Value Ref Range Status   Specimen Description   Final    BLOOD LEFT ANTECUBITAL Performed at [facility] [address], [place], [place] [address]    Special Requests   Final    BOTTLES DRAWN AEROBIC AND ANAEROBIC Blood Culture results may not be optimal due to an inadequate volume of blood received in culture  bottles Performed at [facility] [address], [place], [place] [address]    Culture  Setup Time   Final    GRAM POSITIVE COCCI ANAEROBIC BOTTLE ONLY CRITICAL VALUE NOTED.  VALUE IS CONSISTENT WITH PREVIOUSLY REPORTED AND CALLED VALUE.    Culture (A)  Final    STAPHYLOCOCCUS HOMINIS STAPHYLOCOCCUS EPIDERMIDIS THE SIGNIFICANCE OF ISOLATING THIS ORGANISM FROM A SINGLE SET OF BLOOD CULTURES WHEN MULTIPLE SETS ARE DRAWN IS UNCERTAIN. PLEASE NOTIFY THE MICROBIOLOGY DEPARTMENT WITHIN ONE WEEK IF SPECIATION AND SENSITIVITIES ARE REQUIRED. Performed at [facility], [place] [address] [place], [place] [address]    Report Status 08/22/2021 FINAL  Final   Organism ID, Bacteria STAPHYLOCOCCUS HOMINIS  Final  Susceptibility   Staphylococcus hominis - MIC*    CIPROFLOXACIN <=0.5 SENSITIVE Sensitive     ERYTHROMYCIN >=8 RESISTANT Resistant     GENTAMICIN <=0.5 SENSITIVE Sensitive     OXACILLIN <=0.25 SENSITIVE Sensitive     TETRACYCLINE <=1 SENSITIVE Sensitive     VANCOMYCIN <=0.5 SENSITIVE Sensitive     TRIMETH/SULFA <=10 SENSITIVE Sensitive     CLINDAMYCIN <=0.25 SENSITIVE Sensitive     RIFAMPIN <=0.5 SENSITIVE Sensitive     Inducible Clindamycin NEGATIVE Sensitive     * STAPHYLOCOCCUS HOMINIS  Blood Culture (routine x 2)     Status: Abnormal   Collection Time: 08/18/21 12:20 PM   Specimen: BLOOD  Result Value Ref Range Status   Specimen Description   Final    BLOOD RIGHT ANTECUBITAL Performed at [facility] [address], [place], [place] [address]    Special Requests   Final    BOTTLES DRAWN AEROBIC AND ANAEROBIC Blood Culture adequate volume Performed at [facility] [address], [place], [place] [address]    Culture  Setup Time   Final    GRAM POSITIVE COCCI IN CLUSTERS AEROBIC BOTTLE ONLY CRITICAL RESULT CALLED TO, READ BACK BY AND VERIFIED WITH: PHARMD A PHAM [phone] MLM    Culture (A)  Final     STAPHYLOCOCCUS HOMINIS SUSCEPTIBILITIES PERFORMED ON PREVIOUS CULTURE WITHIN THE LAST 5 DAYS. Performed at [facility], [place] [address] [place], [place] [address]    Report Status 08/22/2021 FINAL  Final  Blood Culture ID Panel (Reflexed)     Status: Abnormal   Collection Time: 08/18/21 12:20 PM  Result Value Ref Range Status   Enterococcus faecalis NOT DETECTED NOT DETECTED Final   Enterococcus Faecium NOT DETECTED NOT DETECTED Final   Listeria monocytogenes NOT DETECTED NOT DETECTED Final   Staphylococcus species DETECTED (A) NOT DETECTED Final    Comment: CRITICAL RESULT CALLED TO, READ BACK BY AND VERIFIED WITH: PJHARMD A PHAM [phone] MLM    Staphylococcus aureus (BCID) NOT DETECTED NOT DETECTED Final   Staphylococcus epidermidis NOT DETECTED NOT DETECTED Final   Staphylococcus lugdunensis NOT DETECTED NOT DETECTED Final   Streptococcus species NOT DETECTED NOT DETECTED Final   Streptococcus agalactiae NOT DETECTED NOT DETECTED Final   Streptococcus pneumoniae NOT DETECTED NOT DETECTED Final   Streptococcus pyogenes NOT DETECTED NOT DETECTED Final   A.calcoaceticus-baumannii NOT DETECTED NOT DETECTED Final   Bacteroides fragilis NOT DETECTED NOT DETECTED Final   Enterobacterales NOT DETECTED NOT DETECTED Final   Enterobacter cloacae complex NOT DETECTED NOT DETECTED Final   Escherichia coli NOT DETECTED NOT DETECTED Final   Klebsiella aerogenes NOT DETECTED NOT DETECTED Final   Klebsiella oxytoca NOT DETECTED NOT DETECTED Final   Klebsiella pneumoniae NOT DETECTED NOT DETECTED Final   Proteus species NOT DETECTED NOT DETECTED Final   Salmonella species NOT DETECTED NOT DETECTED Final   Serratia marcescens NOT DETECTED NOT DETECTED Final   Haemophilus influenzae NOT DETECTED NOT DETECTED Final   Neisseria meningitidis NOT DETECTED NOT DETECTED Final   Pseudomonas aeruginosa NOT DETECTED NOT DETECTED Final   Stenotrophomonas maltophilia NOT DETECTED NOT DETECTED Final    Candida albicans NOT DETECTED NOT DETECTED Final   Candida auris NOT DETECTED NOT DETECTED Final   Candida glabrata NOT DETECTED NOT DETECTED Final   Candida krusei NOT DETECTED NOT DETECTED Final   Candida parapsilosis NOT DETECTED NOT DETECTED Final   Candida tropicalis NOT DETECTED NOT DETECTED Final  Cryptococcus neoformans/gattii NOT DETECTED NOT DETECTED Final    Comment: Performed at [facility], [place] [address], [place], [place] [address]  Culture, blood (Routine X 2) w Reflex to ID Panel     Status: None (Preliminary result)   Collection Time: 08/21/21  4:39 PM   Specimen: BLOOD  Result Value Ref Range Status   Specimen Description   Final    BLOOD RIGHT ANTECUBITAL Performed at [facility], [place] [address], [place], [place] [address]    Special Requests   Final    BOTTLES DRAWN AEROBIC ONLY Blood Culture adequate volume Performed at [facility] [address] [place], [place] [address]    Culture   Final    NO GROWTH < 24 HOURS Performed at [facility] [address], [place], [place] [address]    Report Status PENDING  Incomplete  Culture, blood (Routine X 2) w Reflex to ID Panel     Status: None (Preliminary result)   Collection Time: 08/21/21  4:39 PM   Specimen: BLOOD RIGHT HAND  Result Value Ref Range Status   Specimen Description   Final    BLOOD RIGHT HAND Performed at [facility] [address] [place], [place] [address]    Special Requests   Final    BOTTLES DRAWN AEROBIC ONLY Blood Culture adequate volume Performed at [facility] [address], [place], [place] [address]    Culture   Final    NO GROWTH < 24 HOURS Performed at [facility] [address], [place], [place] [address]    Report Status PENDING  Incomplete     Time coordinating discharge: Over 30 minutes  SIGNED:   Shawna Clamp, MD  [facility] 08/23/2021, 2:44 PM Pager   If  7PM-7AM, please contact night-coverage

## 2021-08-23 NOTE — TOC CM/SW Note (Signed)
°  Transition of Care [facility]) Screening Note   Patient Details  Name: [name] Date of Birth: May 21, 1956   [facility]) CM/SW Contact:    Ross Ludwig, LCSW Phone Number: 08/23/2021, 11:11 AM    Transition of Care Department [facility]) has reviewed patient and no TOC needs have been identified at this time. We will continue to monitor patient advancement through interdisciplinary progression rounds. If new patient transition needs arise, please place a TOC consult.

## 2021-08-23 NOTE — Discharge Instructions (Signed)
Advised to follow-up with primary care physician in 1 week. Advised to follow-up with radiation oncology as per schedule for radiation treatment Advised to follow-up with oncologist as per schedule.

## 2021-08-23 NOTE — Progress Notes (Signed)
Provided and discussed discharge instructions. Addressed all questions and concerns. Jerene Pitch

## 2021-08-23 NOTE — Plan of Care (Signed)
°  Problem: Health Behavior/Discharge Planning: Goal: Ability to manage health-related needs will improve Outcome: Progressing   Problem: Clinical Measurements: Goal: Diagnostic test results will improve Outcome: Progressing Goal: Cardiovascular complication will be avoided Outcome: Progressing   Problem: Activity: Goal: Risk for activity intolerance will decrease Outcome: Progressing

## 2021-08-25 ENCOUNTER — Telehealth: Payer: Self-pay

## 2021-08-25 ENCOUNTER — Encounter: Payer: Medicare Other | Admitting: Nurse Practitioner

## 2021-08-25 DIAGNOSIS — C7931 Secondary malignant neoplasm of brain: Secondary | ICD-10-CM | POA: Diagnosis present

## 2021-08-25 DIAGNOSIS — C7A1 Malignant poorly differentiated neuroendocrine tumors: Secondary | ICD-10-CM | POA: Diagnosis not present

## 2021-08-25 DIAGNOSIS — Z510 Encounter for antineoplastic radiation therapy: Secondary | ICD-10-CM | POA: Diagnosis not present

## 2021-08-25 NOTE — Telephone Encounter (Signed)
Mr. Caesar called asking about details of his appt today. He stated that he was still in pain, but able to take his pain medication and sleep a little. I asked him if he would want to switch his appt to tomorrow because he also has a radiation appt and we could better adjust his medications after understanding his treatment plan goals. He stated that this would work better. Appt with our team changed to tomorrow at 12:45 after radiation appt. All questions answered. Understanding verbalized.

## 2021-08-26 ENCOUNTER — Telehealth: Payer: Self-pay

## 2021-08-26 ENCOUNTER — Other Ambulatory Visit: Payer: Self-pay

## 2021-08-26 ENCOUNTER — Inpatient Hospital Stay (HOSPITAL_BASED_OUTPATIENT_CLINIC_OR_DEPARTMENT_OTHER): Payer: Medicare Other | Admitting: Nurse Practitioner

## 2021-08-26 ENCOUNTER — Ambulatory Visit
Admission: RE | Admit: 2021-08-26 | Discharge: 2021-08-26 | Disposition: A | Discharge status: Home / Self Care | Payer: Medicare Other | Source: Ambulatory Visit | Attending: Radiation Oncology | Admitting: Radiation Oncology

## 2021-08-26 ENCOUNTER — Encounter: Payer: Self-pay | Admitting: Radiation Oncology

## 2021-08-26 VITALS — BP 122/84 | HR 131 | Temp 96.1°F | Resp 18

## 2021-08-26 DIAGNOSIS — C7931 Secondary malignant neoplasm of brain: Secondary | ICD-10-CM | POA: Diagnosis present

## 2021-08-26 DIAGNOSIS — C7A1 Malignant poorly differentiated neuroendocrine tumors: Secondary | ICD-10-CM

## 2021-08-26 DIAGNOSIS — R531 Weakness: Secondary | ICD-10-CM | POA: Diagnosis not present

## 2021-08-26 DIAGNOSIS — G893 Neoplasm related pain (acute) (chronic): Secondary | ICD-10-CM

## 2021-08-26 DIAGNOSIS — Z7189 Other specified counseling: Secondary | ICD-10-CM

## 2021-08-26 DIAGNOSIS — C3491 Malignant neoplasm of unspecified part of right bronchus or lung: Secondary | ICD-10-CM

## 2021-08-26 DIAGNOSIS — Z515 Encounter for palliative care: Secondary | ICD-10-CM | POA: Diagnosis not present

## 2021-08-26 DIAGNOSIS — R110 Nausea: Secondary | ICD-10-CM

## 2021-08-26 DIAGNOSIS — Z510 Encounter for antineoplastic radiation therapy: Secondary | ICD-10-CM | POA: Diagnosis present

## 2021-08-26 LAB — CULTURE, BLOOD (ROUTINE X 2)
Culture: NO GROWTH
Culture: NO GROWTH
Special Requests: ADEQUATE
Special Requests: ADEQUATE

## 2021-08-26 MED ORDER — GABAPENTIN 100 MG PO CAPS: 100.0000 mg | ORAL_CAPSULE | Freq: Every day | ORAL | 1 refills | Status: DC

## 2021-08-26 MED ORDER — HYDROMORPHONE HCL 4 MG PO TABS: 4.0000 mg | ORAL_TABLET | ORAL | 0 refills | Status: DC | PRN

## 2021-08-26 NOTE — Progress Notes (Signed)
[place]  [phone] [phone]   Name: [name] Date: 08/26/2021 MRN: [id number]  DOB: 1956/01/04  Patient Care Team: [name], MD as PCP - General (Family Medicine) [name], MD as PCP - Cardiology (Cardiology) Icard, Octavio Graves, DO as Consulting Physician (Pulmonary Disease) Valrie Hart, RN as Oncology Nurse Navigator (Oncology) Maryanna Shape, NP as Nurse Practitioner (Oncology) Curt Bears, MD as Consulting Physician (Oncology) Pickenpack-Cousar, Carlena Sax, NP as Nurse Practitioner (Nurse Practitioner)    INTERVAL HISTORY: [name] is a 65 y.o. 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 Palliative following after initial visit during recent hospitalization for ongoing symptom management support.     SOCIAL HISTORY:     reports that he quit smoking about 5 years ago. His smoking use included cigarettes. He has a 33.00 pack-year smoking history. He has never used smokeless tobacco. He reports that he does not currently use alcohol. He reports that he does not use drugs.  ADVANCE DIRECTIVES:    CODE STATUS:   PAST MEDICAL HISTORY: Past Medical History:  Diagnosis Date   Anemia    Anxiety    Arthritis    Basal cell carcinoma (BCC) of forehead    CAD (coronary artery disease)    a. 10/2015 ant STEMI >> LHC with 3 v CAD; oLAD tx with POBA >> emergent CABG. b. Multiple evals since that time, early graft failure of SVG-RCA by cath 03/2016. c. 2/19 PCI/DES x1 to pRCA, normal EF.   Carotid artery disease ([place])    a. 40-59% BICA 02/2018.   Depression    Dyspnea    Ectopic atrial tachycardia (HCC)    Esophageal reflux    eosinophil esophagitis    Family history of adverse reaction to anesthesia    "sister has PONV" (06/21/2017)   Former tobacco use    Gout    Hepatitis C    "treated and cured" (06/21/2017)   High cholesterol    History of blood transfusion    History of kidney stones    Hypertension    Ischemic cardiomyopathy    a. EF 25-30% at intraop TEE 4/17  //  b. Limited Echo 5/17 - EF 45-50%, mild ant HK. c. EF 55-65% by cath 09/2017.   Migraine    "3-4/yr" (06/21/2017)   Myocardial infarction ([place]) 10/2015   Palpitations    Pneumonia    Sinus bradycardia    a. HR dropping into 40s in 02/2016 -> BB reduced.   Stroke [facility]) 10/2016   "small one; sometimes my memory/cognitive issues" (06/21/2017)   Symptomatic hypotension    a. 02/2016 ER visit -> meds reduced.   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  MEDICATIONS:  Current Outpatient Medications  Medication Sig Dispense Refill   acetaminophen (TYLENOL) 325 MG tablet Take 2 tablets (650 mg total) by mouth 3 (three) times daily.     aspirin EC 81 MG tablet Take 81 mg by mouth daily. Swallow whole.     atorvastatin (LIPITOR) 80 MG tablet TAKE 1 TABLET(80 MG) BY MOUTH DAILY (Patient taking differently: Take 80 mg by mouth daily.) 90 tablet  2   docusate sodium (COLACE) 100 MG capsule Take 100 mg by mouth daily as needed for mild constipation. 1-2     DULoxetine 40 MG CPEP Take 40 mg by mouth every evening. 30 capsule 1   ferrous gluconate (FERGON) 324 MG tablet Take 1 tablet (324 mg total) by mouth daily with breakfast. 30 tablet 2   gabapentin (NEURONTIN) 100 MG capsule Take 1 capsule (100 mg total) by mouth at bedtime. 30 capsule 1   HYDROmorphone (DILAUDID) 4 MG tablet Take 1 tablet (4 mg total) by mouth every 4 (four) hours as needed for moderate pain. 90 tablet 0   lidocaine (LIDODERM) 5 % Place 1 patch onto the skin daily. Remove & Discard patch within 12 hours or as directed by MD (Patient taking differently: Place 1 patch onto the skin daily as needed (pain). Remove & Discard patch within 12 hours or as directed by MD) 30 patch 0   lidocaine-prilocaine (EMLA) cream Apply 1 application topically once as needed (port access). (Patient taking differently: Apply 1 application topically daily as needed (port access).)     metoprolol succinate (TOPROL-XL) 25 MG 24 hr tablet Take 1 tablet (25 mg total) by mouth daily. (Patient taking differently: Take 25 mg by mouth every evening.) 90 tablet 3   midodrine (PROAMATINE) 2.5 MG tablet Take 1 tablet (2.5 mg total) by mouth 3 (three) times daily with meals. (Patient taking differently: Take 2.5 mg by mouth 3 (three) times daily as needed (Hypotension).) 90 tablet 5   morphine (MS CONTIN) 15 MG 12 hr tablet Take 1 tablet (15 mg total) by mouth every 12 (twelve) hours. 60 tablet 0   Multiple Vitamin (MULTIVITAMIN WITH MINERALS) TABS tablet Take 1 tablet by mouth daily.     nitroGLYCERIN (NITROSTAT) 0.4 MG SL tablet PLACE 1 TABLET UNDER THE TONGUE EVERY 5 MINUTES AS NEEDED FOR CHEST PAIN. 3 DOSES MAX (Patient taking differently: 0.4 mg every 5 (five) minutes as needed for chest pain.) 25 tablet 4   ondansetron (ZOFRAN) 8 MG tablet Take 1 tablet (8 mg total) by mouth every 8 (eight) hours as  needed for nausea or vomiting. Starting 3 days after chemotherapy 30 tablet 2   prochlorperazine (COMPAZINE) 10 MG tablet Take 1 tablet (10 mg total) by mouth every 6 (six) hours as needed. (Patient taking differently: Take 10 mg by mouth every 6 (six) hours as needed for nausea or vomiting.) 30 tablet 2   ranolazine (RANEXA) 1000 MG SR tablet Take 1 tablet (1,000 mg total) by mouth 2 (two) times daily. 180 tablet 3   XARELTO 20 MG TABS tablet Take 20 mg by mouth every evening.     No current facility-administered medications for this visit.    VITAL SIGNS: There were no vitals taken for this visit. There were no vitals filed for this visit.  Estimated body mass index is 24.48 kg/m as calculated from the following:   Height as of 08/19/21: 5\' 9"  (1.753 m).   Weight as of 08/23/21: 165 lb 12.6 oz (75.2 kg).  LABS: CBC:    Component Value Date/Time   WBC 7.0 08/21/2021 0628   HGB 11.5 (L) 08/21/2021 0628   HGB 12.2 (L) 08/13/2021 0942   HCT 34.7 (L) 08/21/2021 0628   PLT 176 08/21/2021 0628   PLT 212 08/13/2021 0942   MCV 92.0 08/21/2021 0628   NEUTROABS 6.3 08/18/2021 1211   LYMPHSABS 1.6 08/18/2021 1211   MONOABS 0.9 08/18/2021 1211   EOSABS 0.0 08/18/2021 1211   BASOSABS 0.0 08/18/2021 1211   Comprehensive Metabolic Panel:    Component Value Date/Time   NA 134 (L) 08/21/2021 0628   K 4.3 08/21/2021 0628   CL 102 08/21/2021 0628   CO2 26 08/21/2021 0628   BUN 22 08/21/2021 0628   CREATININE 1.21 08/21/2021 0628   CREATININE 1.17 08/13/2021 0942   CREATININE 1.00 02/12/2016 1038   GLUCOSE 93 08/21/2021 0628   CALCIUM 9.4 08/21/2021 0628   AST 17 08/19/2021 0522   AST 21 08/13/2021 0942   ALT 18 08/19/2021 0522   ALT 18 08/13/2021 0942   ALKPHOS 67 08/19/2021 0522   BILITOT 0.7 08/19/2021 0522   BILITOT 0.6 08/13/2021 0942   PROT 6.8 08/19/2021 0522   PROT 6.7 11/08/2017 0833   ALBUMIN 3.7 08/19/2021 0522   ALBUMIN 4.4 11/08/2017 0833     PERFORMANCE STATUS  (ECOG) : 1 - Symptomatic but completely ambulatory   Physical Exam General: NAD Cardiovascular: Tachycardic Pulmonary: clear ant fields Abdomen: soft, nontender, + bowel sounds Extremities: no edema, no joint deformities Skin: no rashes Neurological: AAO x3, mood appropriate   IMPRESSION: Mr. Haskin presents today with support of his wife. He was recently hospitalized and discharged on Christmas day after receiving treatment for flu-like symptoms and positive blood cultures with no growth on repeat.   Reports he has been doing fairly well since discharge. Continues to complain of ongoing pain with some relief on current regimen. Shares he felt better when hospitalized and received IV medications. Education provided on the use of IV medications due to acute needs but not sustainable or an option outpatient at this time. Confirmed goal is to hopeful achieve a better comfort level and some improvement in his quality of life although may not be realistic to completely eliminate all pain. Education provided on cautiousness of increasing medications given brain mets and plans for radiation treatment.   He has been taking gabapentin 100mg  nightly. Discussed increasing dosing to hopefully gain increased pain relief vs increasing MS Contin or Dilaudid. He acknowledges over 10 years ago he was taking gabapentin and this was discontinued due to dizziness. Explained to continue with increased dose nightly and we will continue to closely monitory and make adjustments as needed to medications or consider other options.   Patient is inquiring about pain control with hospice support. Education provided on symptom management in the home versus residential hospice. He confirms he is not interested in hospice however, gaining awareness due to the uncertainty of his condition. Support provided.   I discussed the importance of continued conversation with family and their medical providers regarding overall plan of  care and treatment options, ensuring decisions are within the context of the patients values and GOCs.  PLAN: Continue with current pain regimen MS Contin 15 mg and Dilaudid for breakthrough. No adjustments Patient to  increased gabapentin to 200mg  QHS and 100 mg twice a day in addition to this dose.  Patient should follow-up with his cardiology due to tachycardia and extensive cardiac history (Dr. Beau Fanny)  I will plan to see patient back next week in collaboration with his other scheduled appointments.    Patient expressed understanding and was in agreement with this plan. He also understands that He can call the clinic at any time with any questions, concerns, or complaints.   Time Total: 35 min.   Visit consisted of counseling and education dealing with the complex and emotionally intense issues of symptom management and palliative care in the setting of serious and potentially life-threatening illness.Greater than 50%  of this time was spent counseling and coordinating care related to the above assessment and plan.  Signed by: Alda Lea, AGPCNP-BC Palliative Medicine Team

## 2021-08-26 NOTE — Telephone Encounter (Signed)
I called [name] to clarify the dosage for his Gabepentin. I instructed him to take one tablet in the morning, one table midday, and two tablets at bedtime. Understanding verbalized. All questions answered. Advised to call our office with any questions/concerns.

## 2021-08-26 NOTE — Op Note (Signed)
Name: [name]    MRN: [id number]   Date: 08/26/2021    DOB: [birth year]   STEREOTACTIC RADIOSURGERY OPERATIVE NOTE  PRE-OPERATIVE DIAGNOSIS:  metastatic brain disease  POST-OPERATIVE DIAGNOSIS:  metastatic brain disease  PROCEDURE:   Stereotactic Radiosurgery using TrueBeam [place] to metastatic brain tumor Stereotactic Radiosurgery using TrueBeam [place] to metastatic brain tumor, add'l lesion Stereotactic Radiosurgery using TrueBeam [place] to metastatic brain tumor, add'l lesion Stereotactic Radiosurgery using TrueBeam [place] to metastatic brain tumor, add'l lesion  SURGEON:  Duffy Rhody, MD  RADIATION ONCOLOGIST: Kyung Rudd, MD  TECHNIQUE:  The patient underwent a radiation treatment planning session in the radiation oncology simulation suite under the care of the radiation oncology physician and physicist.  I participated closely in the radiation treatment planning afterwards. The patient underwent planning CT which was fused to 3T high resolution MRI with 1 mm axial slices.  These images were fused on the planning system.  We contoured the gross target volumes for the left frontal, left parietal, right posterior frontal and right posterior insular lesions, and subsequently expanded these by 1 mm to yield the Planning Target Volumes. I actively participated in the planning process.  I helped to define and review the four target contours and also the contours of the optic pathway, eyes, brainstem and selected nearby organs at risk.  All the dose constraints for critical structures were reviewed and compared to AAPM Task Group 101.  The prescription dose conformity was reviewed.  I approved the plan electronically.    Accordingly, [name]  was brought to the TrueBeam stereotactic radiation treatment linac and placed in the custom immobilization mask.  The patient was aligned according to the IR fiducial markers with BrainLab Exactrac, then orthogonal x-rays were used in ExacTrac  with the 6DOF robotic table and the shifts were made to align the patient  [name] received stereotactic radiosurgery to a prescription dose of 20 Gy uneventfully in a single fraction to each of the lesions.  The detailed description of the procedure is recorded in the radiation oncology procedure note.  I was present for the duration of the procedure.  DISPOSITION:   Following delivery, the patient was transported to nursing in stable condition and monitored for possible acute effects to be discharged to home in stable condition with follow-up in one month.  Duffy Rhody, MD [facility]

## 2021-08-26 NOTE — Progress Notes (Signed)
°  Radiation Oncology         [phone] ________________________________  Stereotactic Treatment Procedure Note  Name: YANCARLOS BERTHOLD MRN: [id number]  Date: 08/26/2021  DOB: 1956-06-21  SPECIAL TREATMENT PROCEDURE  No diagnosis found.  3D TREATMENT PLANNING AND DOSIMETRY:  The patient's radiation plan was reviewed and approved by neurosurgery and radiation oncology prior to treatment.  It showed 3-dimensional radiation distributions overlaid onto the planning CT/MRI image set.  The [facility] for the target structures as well as the organs at risk were reviewed. The documentation of the 3D plan and dosimetry are filed in the radiation oncology EMR.  NARRATIVE:  KAIVEN VESTER was brought to the TrueBeam stereotactic radiation treatment machine and placed supine on the CT couch. The head frame was applied, and the patient was set up for stereotactic radiosurgery.  Neurosurgery was present for the set-up and delivery  SIMULATION VERIFICATION:  In the couch zero-angle position, the patient underwent Exactrac imaging using the Brainlab system with orthogonal KV images.  These were carefully aligned and repeated to confirm treatment position for each of the isocenters.  The Exactrac snap film verification was repeated at each couch angle.  PROCEDURE: [name] received stereotactic radiosurgery to the following targets:    These four targets were treated with single isocenter using 4 Rapid Arc VMAT Beams to a prescription dose of 20 Gy.  ExacTrac registration was performed for each couch angle.  The 100% isodose line was prescribed.  6 MV X-rays were delivered in the flattening filter free beam mode.  STEREOTACTIC TREATMENT MANAGEMENT:  Following delivery, the patient was transported to nursing in stable condition and monitored for possible acute effects.  Vital signs were recorded BP (!) 125/91 (BP Location: Left Arm, Patient Position: Sitting)    Pulse (!) 130    Temp (!) 96.1 F (35.6  C) (Temporal)    Resp 18    SpO2 100% . The patient tolerated treatment without significant acute effects, and was discharged to home in stable condition.    PLAN: Follow-up in one month.  ________________________________  Sheral Apley. Tammi Klippel, M.D.

## 2021-08-26 NOTE — Telephone Encounter (Signed)
Spoke with patient to let him know that Cassie PA-C would be calling him tomorrow to try and address some of his questions/concerns. Also informed him that I had sent an in-basket to his whole oncology team indicating that he would like a more in-depth conversation about his prognosis and goals of treatment when Dr. Lisbeth Renshaw and Dr. [name] are back in clinic next week. Patient verbalized understanding and appreciation of call. No other needs identified at this time

## 2021-08-26 NOTE — Progress Notes (Signed)
Nurse monitoring complete status post 1 of 1 SRS treatments. Patient without complaints. Patient denies new or worsening neurologic symptoms. Vitals stable (remains tachycardic, but patient reports this is not a new symptom for him). Instructed patient to avoid strenuous activity for the next 24 hours. . Instructed patient to call [phone] with needs related to treatment after hours or over the weekend. Patient and wife verbalized understanding. Patient escorted out of clinic via wheelchair to wife's vehicle without incident  Vitals:   08/26/21 1235 08/26/21 1310  BP: (!) 125/91 122/84  Pulse: (!) 130 (!) 131  Resp: 18   Temp: (!) 96.1 F (35.6 C)   SpO2: 100% 99%

## 2021-08-27 ENCOUNTER — Telehealth: Payer: Self-pay | Admitting: Physician Assistant

## 2021-08-27 ENCOUNTER — Telehealth: Payer: Self-pay

## 2021-08-27 ENCOUNTER — Ambulatory Visit: Payer: Medicare Other | Admitting: Radiation Oncology

## 2021-08-27 ENCOUNTER — Telehealth: Payer: Self-pay | Admitting: *Deleted

## 2021-08-27 ENCOUNTER — Other Ambulatory Visit: Payer: Self-pay | Admitting: Nurse Practitioner

## 2021-08-27 DIAGNOSIS — C7A1 Malignant poorly differentiated neuroendocrine tumors: Secondary | ICD-10-CM

## 2021-08-27 DIAGNOSIS — Z510 Encounter for antineoplastic radiation therapy: Secondary | ICD-10-CM | POA: Diagnosis present

## 2021-08-27 DIAGNOSIS — R079 Chest pain, unspecified: Secondary | ICD-10-CM | POA: Diagnosis not present

## 2021-08-27 DIAGNOSIS — C7931 Secondary malignant neoplasm of brain: Secondary | ICD-10-CM | POA: Diagnosis present

## 2021-08-27 DIAGNOSIS — G893 Neoplasm related pain (acute) (chronic): Secondary | ICD-10-CM | POA: Diagnosis not present

## 2021-08-27 DIAGNOSIS — R110 Nausea: Secondary | ICD-10-CM

## 2021-08-27 MED ORDER — ONDANSETRON HCL 8 MG PO TABS: 8.0000 mg | ORAL_TABLET | Freq: Three times a day (TID) | ORAL | 2 refills | Status: DC | PRN

## 2021-08-27 MED ORDER — PROCHLORPERAZINE MALEATE 10 MG PO TABS: 10.0000 mg | ORAL_TABLET | Freq: Four times a day (QID) | ORAL | 2 refills | Status: DC | PRN

## 2021-08-27 NOTE — Telephone Encounter (Signed)
[name] called today with concerns about dizziness and nausea/vomiting that he has been previously experiencing. He stated that he took his Gabepentin on an empty stomach last night. I educated him on taking his medication with food. We also went over his antiemetics and he stated that he has a few Zofran tablets left and does not have any Compazine, which he felt worked better. Refills sent in per [facility], NP. Advised [name] to call with any questions/concerns and if this regimen doesn't seem to be helping. Understanding verbalized and all questions were answered.

## 2021-08-27 NOTE — Telephone Encounter (Signed)
I received a message that this patient had several questions. I called him to see if there is anything that I can answer from medical oncology standpoint. I answered his questions to the best of my ability; however, most of his questions were related to changes in his radiation course. From what I can tell, Dr. Lisbeth Renshaw is out of the office today and is planning to review his rationale with the patient when he sees the patient next week. Discussed that plans change and there are a lot of moving parts and as we gather more information based on scan/test results, plans may change. The patient had a CT angio performed on 08/18/21. He is wondering if Dr. [name] has reviewed this. I explained that Dr. [name] is out of the office today but I will review the scan with Dr. [name] to see if any additional systemic treatment would be recommended at this time. I did tell the patient that any treatment he is receiving now would be palliative in nature to prolong his life. He stated he was going to fight until the end. He also is reporting dizziness. Unclear if s/e of his SRS yesterday or changes in medication. I will relay to radiation oncology RN to see if this symptom is expected or if there is any additional recommendations needed. I will also keep palliative care in the loop. He has a history of afib with RVR. He had questions about when he should be concerned about his pulse. Discussed it is physiologic under certain situations for your heart rate to elevate based on demand during exertion; however, if he has a persistently elevated heart rate and/or with symptoms such as chest pain, syncope, lightheadedness, and shortness of breath, that that would warrant emergency room evaluation and be concerning for arrhythmia.

## 2021-08-27 NOTE — Telephone Encounter (Signed)
Returned call to the patient regarding a voicemail he left.  He reports he is not feeling well this morning and would like to cancel his radiation treatment today.  He reports he was seen by the palliative care team yesterday and some changes were made to his Gabapentin dose.  He reports this morning feeling dizzy, fatigued, and his pain has increased.  He was advised to give palliative a call regarding his medication changes.  His treatment for today was cancelled and he was advised to give me a call in the morning if he was unable to make tomorrows treatment.  He verbalized understanding of all instructions.  Gloriajean Dell. Leonie Green, BSN

## 2021-08-28 ENCOUNTER — Ambulatory Visit: Payer: Medicare Other

## 2021-08-28 ENCOUNTER — Ambulatory Visit: Payer: Medicare Other | Admitting: Radiation Oncology

## 2021-08-29 ENCOUNTER — Emergency Department (HOSPITAL_COMMUNITY): Payer: Medicare Other

## 2021-08-29 ENCOUNTER — Other Ambulatory Visit: Payer: Self-pay

## 2021-08-29 ENCOUNTER — Inpatient Hospital Stay (HOSPITAL_COMMUNITY)
Admission: EM | Admit: 2021-08-29 | Discharge: 2021-09-03 | DRG: 948 | Disposition: A | Discharge status: Home Health | Payer: Medicare Other | Attending: Family Medicine | Admitting: Family Medicine

## 2021-08-29 ENCOUNTER — Encounter (HOSPITAL_COMMUNITY): Payer: Self-pay

## 2021-08-29 DIAGNOSIS — C7951 Secondary malignant neoplasm of bone: Secondary | ICD-10-CM | POA: Diagnosis present

## 2021-08-29 DIAGNOSIS — I2510 Atherosclerotic heart disease of native coronary artery without angina pectoris: Secondary | ICD-10-CM | POA: Diagnosis present

## 2021-08-29 DIAGNOSIS — C7931 Secondary malignant neoplasm of brain: Secondary | ICD-10-CM | POA: Diagnosis present

## 2021-08-29 DIAGNOSIS — G43909 Migraine, unspecified, not intractable, without status migrainosus: Secondary | ICD-10-CM | POA: Diagnosis present

## 2021-08-29 DIAGNOSIS — F319 Bipolar disorder, unspecified: Secondary | ICD-10-CM | POA: Diagnosis present

## 2021-08-29 DIAGNOSIS — G893 Neoplasm related pain (acute) (chronic): Secondary | ICD-10-CM | POA: Diagnosis not present

## 2021-08-29 DIAGNOSIS — Z981 Arthrodesis status: Secondary | ICD-10-CM

## 2021-08-29 DIAGNOSIS — C7989 Secondary malignant neoplasm of other specified sites: Secondary | ICD-10-CM

## 2021-08-29 DIAGNOSIS — I482 Chronic atrial fibrillation, unspecified: Secondary | ICD-10-CM | POA: Diagnosis not present

## 2021-08-29 DIAGNOSIS — C3490 Malignant neoplasm of unspecified part of unspecified bronchus or lung: Secondary | ICD-10-CM | POA: Diagnosis present

## 2021-08-29 DIAGNOSIS — N1831 Chronic kidney disease, stage 3a: Secondary | ICD-10-CM | POA: Diagnosis present

## 2021-08-29 DIAGNOSIS — Z9221 Personal history of antineoplastic chemotherapy: Secondary | ICD-10-CM

## 2021-08-29 DIAGNOSIS — N179 Acute kidney failure, unspecified: Secondary | ICD-10-CM | POA: Diagnosis present

## 2021-08-29 DIAGNOSIS — R0789 Other chest pain: Secondary | ICD-10-CM | POA: Diagnosis not present

## 2021-08-29 DIAGNOSIS — Z888 Allergy status to other drugs, medicaments and biological substances status: Secondary | ICD-10-CM

## 2021-08-29 DIAGNOSIS — M1990 Unspecified osteoarthritis, unspecified site: Secondary | ICD-10-CM | POA: Diagnosis present

## 2021-08-29 DIAGNOSIS — M797 Fibromyalgia: Secondary | ICD-10-CM | POA: Diagnosis present

## 2021-08-29 DIAGNOSIS — Z923 Personal history of irradiation: Secondary | ICD-10-CM

## 2021-08-29 DIAGNOSIS — Z887 Allergy status to serum and vaccine status: Secondary | ICD-10-CM

## 2021-08-29 DIAGNOSIS — G894 Chronic pain syndrome: Secondary | ICD-10-CM | POA: Diagnosis present

## 2021-08-29 DIAGNOSIS — K219 Gastro-esophageal reflux disease without esophagitis: Secondary | ICD-10-CM | POA: Diagnosis present

## 2021-08-29 DIAGNOSIS — Z85828 Personal history of other malignant neoplasm of skin: Secondary | ICD-10-CM

## 2021-08-29 DIAGNOSIS — I480 Paroxysmal atrial fibrillation: Secondary | ICD-10-CM | POA: Diagnosis present

## 2021-08-29 DIAGNOSIS — Z85118 Personal history of other malignant neoplasm of bronchus and lung: Secondary | ICD-10-CM

## 2021-08-29 DIAGNOSIS — Z87891 Personal history of nicotine dependence: Secondary | ICD-10-CM

## 2021-08-29 DIAGNOSIS — Z801 Family history of malignant neoplasm of trachea, bronchus and lung: Secondary | ICD-10-CM

## 2021-08-29 DIAGNOSIS — I9589 Other hypotension: Secondary | ICD-10-CM | POA: Diagnosis present

## 2021-08-29 DIAGNOSIS — F419 Anxiety disorder, unspecified: Secondary | ICD-10-CM | POA: Diagnosis present

## 2021-08-29 DIAGNOSIS — M109 Gout, unspecified: Secondary | ICD-10-CM | POA: Diagnosis present

## 2021-08-29 DIAGNOSIS — Z7982 Long term (current) use of aspirin: Secondary | ICD-10-CM

## 2021-08-29 DIAGNOSIS — Z86711 Personal history of pulmonary embolism: Secondary | ICD-10-CM

## 2021-08-29 DIAGNOSIS — I129 Hypertensive chronic kidney disease with stage 1 through stage 4 chronic kidney disease, or unspecified chronic kidney disease: Secondary | ICD-10-CM | POA: Diagnosis present

## 2021-08-29 DIAGNOSIS — Z8673 Personal history of transient ischemic attack (TIA), and cerebral infarction without residual deficits: Secondary | ICD-10-CM

## 2021-08-29 DIAGNOSIS — Z823 Family history of stroke: Secondary | ICD-10-CM

## 2021-08-29 DIAGNOSIS — I252 Old myocardial infarction: Secondary | ICD-10-CM

## 2021-08-29 DIAGNOSIS — Z79899 Other long term (current) drug therapy: Secondary | ICD-10-CM

## 2021-08-29 DIAGNOSIS — I255 Ischemic cardiomyopathy: Secondary | ICD-10-CM | POA: Diagnosis present

## 2021-08-29 DIAGNOSIS — B1920 Unspecified viral hepatitis C without hepatic coma: Secondary | ICD-10-CM | POA: Diagnosis present

## 2021-08-29 DIAGNOSIS — Z20822 Contact with and (suspected) exposure to covid-19: Secondary | ICD-10-CM | POA: Diagnosis present

## 2021-08-29 DIAGNOSIS — E7800 Pure hypercholesterolemia, unspecified: Secondary | ICD-10-CM | POA: Diagnosis present

## 2021-08-29 DIAGNOSIS — Z7901 Long term (current) use of anticoagulants: Secondary | ICD-10-CM

## 2021-08-29 DIAGNOSIS — R079 Chest pain, unspecified: Secondary | ICD-10-CM | POA: Diagnosis not present

## 2021-08-29 DIAGNOSIS — Z8249 Family history of ischemic heart disease and other diseases of the circulatory system: Secondary | ICD-10-CM

## 2021-08-29 DIAGNOSIS — Z951 Presence of aortocoronary bypass graft: Secondary | ICD-10-CM

## 2021-08-29 LAB — CBC WITH DIFFERENTIAL/PLATELET
Abs Immature Granulocytes: 0.04 10*3/uL (ref 0.00–0.07)
Basophils Absolute: 0 10*3/uL (ref 0.0–0.1)
Basophils Relative: 0 %
Eosinophils Absolute: 0.3 10*3/uL (ref 0.0–0.5)
Eosinophils Relative: 3 %
HCT: 38.3 % — ABNORMAL LOW (ref 39.0–52.0)
Hemoglobin: 12.4 g/dL — ABNORMAL LOW (ref 13.0–17.0)
Immature Granulocytes: 0 %
Lymphocytes Relative: 22 %
Lymphs Abs: 2.1 10*3/uL (ref 0.7–4.0)
MCH: 30 pg (ref 26.0–34.0)
MCHC: 32.4 g/dL (ref 30.0–36.0)
MCV: 92.7 fL (ref 80.0–100.0)
Monocytes Absolute: 0.8 10*3/uL (ref 0.1–1.0)
Monocytes Relative: 8 %
Neutro Abs: 6.2 10*3/uL (ref 1.7–7.7)
Neutrophils Relative %: 67 %
Platelets: 267 10*3/uL (ref 150–400)
RBC: 4.13 MIL/uL — ABNORMAL LOW (ref 4.22–5.81)
RDW: 12.9 % (ref 11.5–15.5)
WBC: 9.4 10*3/uL (ref 4.0–10.5)
nRBC: 0 % (ref 0.0–0.2)

## 2021-08-29 LAB — BASIC METABOLIC PANEL
Anion gap: 6 (ref 5–15)
BUN: 23 mg/dL (ref 8–23)
CO2: 26 mmol/L (ref 22–32)
Calcium: 10 mg/dL (ref 8.9–10.3)
Chloride: 105 mmol/L (ref 98–111)
Creatinine, Ser: 1.27 mg/dL — ABNORMAL HIGH (ref 0.61–1.24)
GFR, Estimated: >60 mL/min (ref >60)
Glucose, Bld: 98 mg/dL (ref 70–99)
Potassium: 4.2 mmol/L (ref 3.5–5.1)
Sodium: 137 mmol/L (ref 135–145)

## 2021-08-29 LAB — RESP PANEL BY RT-PCR (FLU A&B, COVID) ARPGX2
Influenza A by PCR: NEGATIVE
Influenza B by PCR: NEGATIVE
SARS Coronavirus 2 by RT PCR: NEGATIVE

## 2021-08-29 MED ORDER — GABAPENTIN 100 MG PO CAPS
200.0000 mg | ORAL_CAPSULE | Freq: Every day | ORAL | Status: DC
  Administered 2021-08-30 – 2021-08-31 (×3): 200 mg via ORAL
  Filled 2021-08-29 (×3): qty 2

## 2021-08-29 MED ORDER — ACETAMINOPHEN 325 MG PO TABS
650.0000 mg | ORAL_TABLET | Freq: Three times a day (TID) | ORAL | Status: DC
  Administered 2021-08-30 – 2021-09-03 (×14): 650 mg via ORAL
  Filled 2021-08-29 (×14): qty 2

## 2021-08-29 MED ORDER — GABAPENTIN 100 MG PO CAPS
100.0000 mg | ORAL_CAPSULE | Freq: Two times a day (BID) | ORAL | Status: DC
  Administered 2021-08-30 – 2021-09-03 (×9): 100 mg via ORAL
  Filled 2021-08-29 (×9): qty 1

## 2021-08-29 MED ORDER — ONDANSETRON HCL 4 MG/2ML IJ SOLN
4.0000 mg | Freq: Once | INTRAMUSCULAR | Status: AC
  Administered 2021-08-29: 4 mg via INTRAVENOUS
  Filled 2021-08-29: qty 2

## 2021-08-29 MED ORDER — METHOCARBAMOL 1000 MG/10ML IJ SOLN
500.0000 mg | Freq: Three times a day (TID) | INTRAVENOUS | Status: DC | PRN
  Administered 2021-08-30 (×2): 500 mg via INTRAVENOUS
  Filled 2021-08-29 (×2): qty 500
  Filled 2021-08-29 (×2): qty 5

## 2021-08-29 MED ORDER — LIDOCAINE 5 % EX PTCH
1.0000 | MEDICATED_PATCH | CUTANEOUS | Status: DC
  Administered 2021-08-30 – 2021-09-02 (×5): 1 via TRANSDERMAL
  Filled 2021-08-29 (×5): qty 1

## 2021-08-29 MED ORDER — HYDROMORPHONE HCL 1 MG/ML IJ SOLN
1.0000 mg | Freq: Once | INTRAMUSCULAR | Status: AC
  Administered 2021-08-29: 1 mg via INTRAVENOUS
  Filled 2021-08-29: qty 1

## 2021-08-29 MED ORDER — ADULT MULTIVITAMIN W/MINERALS CH
1.0000 | ORAL_TABLET | Freq: Every day | ORAL | Status: DC
  Administered 2021-08-30 – 2021-09-03 (×5): 1 via ORAL
  Filled 2021-08-29 (×5): qty 1

## 2021-08-29 MED ORDER — PROCHLORPERAZINE EDISYLATE 10 MG/2ML IJ SOLN
10.0000 mg | Freq: Four times a day (QID) | INTRAMUSCULAR | Status: DC | PRN
  Administered 2021-08-31 – 2021-09-03 (×5): 10 mg via INTRAVENOUS
  Filled 2021-08-29 (×5): qty 2

## 2021-08-29 MED ORDER — RIVAROXABAN 10 MG PO TABS
20.0000 mg | ORAL_TABLET | Freq: Every evening | ORAL | Status: DC
  Administered 2021-08-30 – 2021-09-01 (×3): 20 mg via ORAL
  Filled 2021-08-29 (×3): qty 2

## 2021-08-29 MED ORDER — ASPIRIN EC 81 MG PO TBEC
81.0000 mg | DELAYED_RELEASE_TABLET | Freq: Every day | ORAL | Status: DC
  Administered 2021-08-30 – 2021-09-03 (×5): 81 mg via ORAL
  Filled 2021-08-29 (×5): qty 1

## 2021-08-29 MED ORDER — DOCUSATE SODIUM 100 MG PO CAPS: 100.0000 mg | ORAL_CAPSULE | Freq: Every day | ORAL | Status: DC | PRN

## 2021-08-29 MED ORDER — MIDODRINE HCL 2.5 MG PO TABS
2.5000 mg | ORAL_TABLET | Freq: Three times a day (TID) | ORAL | Status: DC
  Administered 2021-08-30 – 2021-09-03 (×12): 2.5 mg via ORAL
  Filled 2021-08-29 (×17): qty 1

## 2021-08-29 MED ORDER — METOPROLOL SUCCINATE ER 25 MG PO TB24
25.0000 mg | ORAL_TABLET | Freq: Every day | ORAL | Status: DC
  Administered 2021-08-30 – 2021-08-31 (×2): 25 mg via ORAL
  Filled 2021-08-29 (×3): qty 1

## 2021-08-29 MED ORDER — ATORVASTATIN CALCIUM 40 MG PO TABS
80.0000 mg | ORAL_TABLET | Freq: Every day | ORAL | Status: DC
  Administered 2021-08-30 – 2021-09-03 (×5): 80 mg via ORAL
  Filled 2021-08-29 (×5): qty 2

## 2021-08-29 MED ORDER — DULOXETINE HCL 20 MG PO CPEP
40.0000 mg | ORAL_CAPSULE | Freq: Every evening | ORAL | Status: DC
  Administered 2021-08-30 – 2021-09-02 (×4): 40 mg via ORAL
  Filled 2021-08-29 (×5): qty 2

## 2021-08-29 MED ORDER — RANOLAZINE ER 500 MG PO TB12
1000.0000 mg | ORAL_TABLET | Freq: Two times a day (BID) | ORAL | Status: DC
  Administered 2021-08-30 – 2021-09-03 (×10): 1000 mg via ORAL
  Filled 2021-08-29 (×10): qty 2

## 2021-08-29 MED ORDER — SODIUM CHLORIDE 0.9 % IV SOLN: INTRAVENOUS | Status: DC

## 2021-08-29 MED ORDER — HYDROMORPHONE HCL 1 MG/ML IJ SOLN
1.0000 mg | INTRAMUSCULAR | Status: DC | PRN
  Administered 2021-08-30 (×2): 1 mg via INTRAVENOUS
  Filled 2021-08-29 (×2): qty 1

## 2021-08-29 MED ORDER — MORPHINE SULFATE ER 15 MG PO TBCR
15.0000 mg | EXTENDED_RELEASE_TABLET | Freq: Three times a day (TID) | ORAL | Status: DC
  Administered 2021-08-30 – 2021-09-01 (×8): 15 mg via ORAL
  Filled 2021-08-29 (×8): qty 1

## 2021-08-29 NOTE — ED Triage Notes (Signed)
Pt. BIB [facility] C/O chest wall pain. He pt. Is a cancer pt. 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 Given 100 mcg of Fentanyl by EMS. HR increased to 120. Given 312mL of NS.   EMS VS:  Hr: 100 BP:138/78 O2: 99% RA

## 2021-08-29 NOTE — ED Provider Notes (Signed)
[name]   CSN: [id number] Arrival date & time: 08/29/21  2055     History Chief Complaint  Patient presents with   Chest Pain    [name] is a 65 y.o. male.  65 year old male with prior medical history as detailed below presents for evaluation. 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  He presents complaining of uncontrolled chronic pain to the right lateral chest wall.  Patient reports known metastatic disease to the ribs on his right lateral chest.  Patient reports that he has been using Morphine 15 mg  every 12 hours in extended release format.  He reports using 4 mg of Dilaudid every 4 hours for breakthrough pain.  He reports that over the last 2 days his pain has been poorly controlled - despite use of his regular narcotic medications.  He denies associated symptoms including fever, shortness of breath, new pain, or other new complaint.   Patient status post recent admission with discharge on August 23, 2021.  The history is provided by the patient.  Chest Pain Pain location:  R lateral chest Pain quality: aching   Pain radiates to:  Does not radiate Pain severity:  Moderate Onset quality:  Gradual Duration:  2 weeks Timing:  Constant     Past Medical History:  Diagnosis Date   Anemia    Anxiety    Arthritis    Basal cell carcinoma (BCC) of forehead    CAD (coronary artery disease)    a. 10/2015 ant STEMI >> LHC with 3 v CAD; oLAD tx with POBA >> emergent CABG. b. Multiple evals since that time, early graft failure of SVG-RCA by cath 03/2016. c. 2/19 PCI/DES x1 to pRCA, normal EF.   Carotid artery disease ([place])    a.  40-59% BICA 02/2018.   Depression    Dyspnea    Ectopic atrial tachycardia (HCC)    Esophageal reflux    eosinophil esophagitis   Family history of adverse reaction to anesthesia    "sister has PONV" (06/21/2017)   Former tobacco use    Gout    Hepatitis C    "treated and cured" (06/21/2017)   High cholesterol    History of blood transfusion    History of kidney stones    Hypertension    Ischemic cardiomyopathy    a. EF 25-30% at intraop TEE 4/17  //  b. Limited Echo 5/17 - EF 45-50%, mild ant HK. c. EF 55-65% by cath 09/2017.   Migraine    "3-4/yr" (06/21/2017)   Myocardial infarction ([place]) 10/2015   Palpitations    Pneumonia    Sinus bradycardia    a. HR dropping into 40s in 02/2016 -> BB reduced.   Stroke [facility]) 10/2016   "small one; sometimes my memory/cognitive issues" (06/21/2017)   Symptomatic hypotension    a. 02/2016 ER visit -> meds reduced.   Syncope    Wears dentures    Wears glasses     Patient Active Problem List   Diagnosis Date Noted   Malnutrition of moderate degree 08/20/2021   Atrial tachycardia ([place]) 08/18/2021   Lung cancer ([place]) 07/29/2021   Hemoptysis 06/06/2021   History of pulmonary embolus (PE) 04/29/2021   Anemia 04/29/2021   Hypotension due to hypovolemia  04/28/2021   Hypomagnesemia 04/08/2021   Port-A-Cath in place 04/07/2021   Protein-calorie malnutrition, severe 03/25/2021   PNA (pneumonia) 03/22/2021   Community acquired pneumonia 03/22/2021   Sepsis ([place]) 03/22/2021   CKD (chronic kidney disease), stage III ([place]) 03/22/2021   Hypoxia 02/28/2021   Dyspnea 02/28/2021   Acute renal failure superimposed on stage 3a chronic kidney disease ([place]) 02/25/2021   AF (paroxysmal atrial fibrillation) ([place]) 02/25/2021   Pleural effusion 02/25/2021   Neutropenia, drug-induced ([place]) 02/24/2021   Positive blood cultures 02/17/2021   Positive blood culture 02/16/2021   Right flank pain 02/09/2021   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[place])  01/30/2021   Encounter for antineoplastic chemotherapy 01/29/2021   Goals of care, counseling/discussion 01/14/2021   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[place]) 01/14/2021   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[place]) 01/14/2021   Acute back pain with sciatica 01/12/2021   Allergic rhinitis 01/12/2021   Amnesia 01/12/2021   Kidney stone 01/12/2021   Sciatica 01/12/2021   Bipolar disorder ([place]) 01/12/2021   S/P lobectomy of lung 12/24/2020   Solitary lung nodule 11/28/2020   Chest pain 11/07/2019   Gastroesophageal reflux disease    Cervical radiculopathy 10/17/2018   Preoperative clearance 10/04/2018   Palpitations    Coronary artery disease involving coronary bypass graft of native heart with angina pectoris ([place])    Transient loss of consciousness 03/24/2018   Ectopic atrial tachycardia ([place]) 02/09/2018   Central chest pain 03/10/2017   Family hx-stroke 11/10/2016   Stroke-like episode ([place]) - R brain, s/p tPA 11/09/2016   Unstable angina ([place]) 09/07/2016   Claudication of both lower extremities ([place])    Pure hypercholesterolemia    Tobacco abuse disorder    CAD of autologous artery bypass graft without angina    Chest pain at rest 06/10/2016   Abnormal nuclear stress test - HIGH RISK 04/20/2016   Old MI (myocardial infarction)    Essential hypertension 02/26/2016   Ischemic cardiomyopathy 12/25/2015   Hyperlipidemia LDL goal <70 12/25/2015   Mild tobacco abuse in early remission 11/28/2015   Coronary artery disease involving native coronary artery of native heart with angina pectoris ([place]) 11/28/2015   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   Acute MI anterior wall first episode care [facility])    Precordial chest pain 03/07/2015   Gout attack 03/07/2015   Mixed bipolar I disorder ([place]) 03/07/2015   Fibromyalgia 07/09/2014   Gout 07/09/2014   Anxiety 07/09/2014   Depression 07/09/2014   Hepatitis C 14/48/1856   Eosinophilic esophagitis 31/49/7026    Past Surgical History:   Procedure Laterality Date   25 GAUGE PARS PLANA VITRECTOMY WITH 20 GAUGE MVR PORT  12/31/2020   Procedure: 25 GAUGE PARS PLANA VITRECTOMY WITH 20 GAUGE MVR PORT;  Surgeon: [name], MD;  [facility];  Service: Thoracic;;   ANTERIOR CERVICAL DECOMP/DISCECTOMY FUSION N/A 10/17/2018   Procedure: Anterior Cervical Decompression Fusion - Cervical seven -Thoracic one;  Surgeon: Consuella Lose, MD;  Location: [place];  Service: Neurosurgery;  Laterality: N/A;   BASAL CELL CARCINOMA EXCISION     "forehead   BIOPSY  07/20/2019   Procedure: BIOPSY;  Surgeon: Carol Ada, MD;  Location: [facility];  Service: Endoscopy;;   BIOPSY OF MEDIASTINAL MASS Right 07/29/2021   Procedure: RIGHT CHEST WALL MASS BIOPSY;  Surgeon: [name], MD;  Location: [place];  Service: Thoracic;  Laterality: Right;   BRONCHIAL BIOPSY  12/24/2020   Procedure: BRONCHIAL BIOPSIES;  Surgeon: Garner Nash,  DO;  Location: [place] ENDOSCOPY;  Service: Pulmonary;;   BRONCHIAL BRUSHINGS  12/24/2020   Procedure: BRONCHIAL BRUSHINGS;  Surgeon: Garner Nash, DO;  Location: [place] ENDOSCOPY;  Service: Pulmonary;;   BRONCHIAL NEEDLE ASPIRATION BIOPSY  12/24/2020   Procedure: BRONCHIAL NEEDLE ASPIRATION BIOPSIES;  Surgeon: Garner Nash, DO;  Location: [place];  Service: Pulmonary;;   CARDIAC CATHETERIZATION N/A 11/28/2015   Procedure: Left Heart Cath and Coronary Angiography;  Surgeon: [name], MD;  Location: [place] CV LAB;  Service: Cardiovascular;  Laterality: N/A;   CARDIAC CATHETERIZATION N/A 11/28/2015   Procedure: Coronary Balloon Angioplasty;  Surgeon: [name], MD;  Location: [place] CV LAB;  Service: Cardiovascular;  Laterality: N/A;  ostial LAD   CARDIAC CATHETERIZATION N/A 11/28/2015   Procedure: Coronary/Graft Angiography;  Surgeon: [name], MD;  Location: [place] CV LAB;  Service: Cardiovascular;  Laterality: N/A;  coronaries only    CARDIAC  CATHETERIZATION N/A 04/21/2016   Procedure: Left Heart Cath and Coronary Angiography;  Surgeon: Wellington Hampshire, MD;  Location: [place] CV LAB;  Service: Cardiovascular;  Laterality: N/A;   CARDIAC CATHETERIZATION N/A 06/14/2016   Procedure: Left Heart Cath and Cors/Grafts Angiography;  Surgeon: Lorretta Harp, MD;  Location: [place] CV LAB;  Service: Cardiovascular;  Laterality: N/A;   CARDIAC CATHETERIZATION N/A 09/08/2016   Procedure: Left Heart Cath and Cors/Grafts Angiography;  Surgeon: Wellington Hampshire, MD;  Location: [place] CV LAB;  Service: Cardiovascular;  Laterality: N/A;   CARDIAC CATHETERIZATION     CORONARY ARTERY BYPASS GRAFT N/A 11/28/2015   Procedure: CORONARY ARTERY BYPASS GRAFTING (CABG) TIMES FIVE USING LEFT INTERNAL MAMMARY ARTERY AND RIGHT GREATER SAPHENOUS,VIEN HARVEATED BY ENDOVIEN, INTRAOPPRATIVE TEE;  Surgeon: Gaye Pollack, MD;  Location: [place];  Service: Open Heart Surgery;  Laterality: N/A;   CORONARY STENT INTERVENTION N/A 10/05/2017   Procedure: CORONARY STENT INTERVENTION;  Surgeon: [name], MD;  Location: [place] CV LAB;  Service: Cardiovascular;  Laterality: N/A;   ESOPHAGOGASTRODUODENOSCOPY (EGD) WITH PROPOFOL N/A 07/20/2019   Procedure: ESOPHAGOGASTRODUODENOSCOPY (EGD) WITH PROPOFOL;  Surgeon: Carol Ada, MD;  [facility];  Service: Endoscopy;  Laterality: N/A;   ESOPHAGOGASTRODUODENOSCOPY (EGD) WITH PROPOFOL N/A 01/30/2021   Procedure: ESOPHAGOGASTRODUODENOSCOPY (EGD) WITH PROPOFOL;  Surgeon: Carol Ada, MD;  [facility];  Service: Endoscopy;  Laterality: N/A;   FIDUCIAL MARKER PLACEMENT  12/24/2020   Procedure: FIDUCIAL DYE MARKER PLACEMENT;  Surgeon: Garner Nash, DO;  Location: [place] END[facility]COPY;  Service: Pulmonary;;   HUMERUS SURGERY Right 1969   "tumor inside bone; filled it w/bone chips"   INTERCOSTAL NERVE BLOCK Right 12/24/2020   Procedure: INTERCOSTAL NERVE BLOCK;  Surgeon: [name], MD;   Location: [place];  Service: Thoracic;  Laterality: Right;   IR IMAGING GUIDED PORT INSERTION  02/06/2021   LEFT HEART CATH AND CORS/GRAFTS ANGIOGRAPHY N/A 03/11/2017   Procedure: Left Heart Cath and Cors/Grafts Angiography;  Surgeon: Leonie Man, MD;  Location: [place] CV LAB;  Service: Cardiovascular;  Laterality: N/A;   LEFT HEART CATH AND CORS/GRAFTS ANGIOGRAPHY N/A 10/05/2017   Procedure: LEFT HEART CATH AND CORS/GRAFTS ANGIOGRAPHY;  Surgeon: [name], MD;  Location: [place] CV LAB;  Service: Cardiovascular;  Laterality: N/A;   LEFT HEART CATH AND CORS/GRAFTS ANGIOGRAPHY N/A 04/11/2019   Procedure: LEFT HEART CATH AND CORS/GRAFTS ANGIOGRAPHY;  Surgeon: [name], MD;  Location: [place] CV LAB;  Service: Cardiovascular;  Laterality: N/A;  NODE DISSECTION Right 12/24/2020   Procedure: NODE DISSECTION;  Surgeon: [name], MD;  Location: [place];  Service: Thoracic;  Laterality: Right;   PERIPHERAL VASCULAR CATHETERIZATION N/A 06/14/2016   Procedure: Lower Extremity Angiography;  Surgeon: Lorretta Harp, MD;  Location: [place] [facility] LAB;  Service: Cardiovascular;  Laterality: N/A;   VIDEO BRONCHOSCOPY WITH ENDOBRONCHIAL NAVIGATION Right 12/24/2020   Procedure: VIDEO BRONCHOSCOPY WITH ENDOBRONCHIAL NAVIGATION;  Surgeon: Garner Nash, DO;  Location: [place];  Service: Pulmonary;  Laterality: Right;   VIDEO BRONCHOSCOPY WITH ENDOBRONCHIAL ULTRASOUND N/A 12/24/2020   Procedure: VIDEO BRONCHOSCOPY WITH ENDOBRONCHIAL ULTRASOUND;  Surgeon: Garner Nash, DO;  Location: [place];  Service: Pulmonary;  Laterality: N/A;   VIDEO BRONCHOSCOPY WITH INSERTION OF INTERBRONCHIAL VALVE (IBV) N/A 12/31/2020   Procedure: VIDEO BRONCHOSCOPY WITH INSERTION OF INTERBRONCHIAL VALVE (IBV).VALVE IN CARTRIDGE 52mm,9mm. CHEST TUBE PLACEMENT.;  Surgeon: [name], MD;  [facility];  Service: Thoracic;  Laterality: N/A;   VIDEO BRONCHOSCOPY WITH INSERTION  OF INTERBRONCHIAL VALVE (IBV) N/A 07/29/2021   Procedure: VIDEO BRONCHOSCOPY WITH REMOVAL OF INTERBRONCHIAL VALVE (IBV);  Surgeon: [name], MD;  [facility];  Service: Thoracic;  Laterality: N/A;       Family History  Problem Relation Age of Onset   Lung cancer Mother    Heart Problems Father    Heart attack Father 34   Stroke Father    Heart failure Father    Heart attack Maternal Grandmother    Stroke Maternal Grandmother    Heart attack Paternal Uncle    Hypertension Brother    Autoimmune disease Neg Hx     Social History   Tobacco Use   Smoking status: Former    Packs/day: 0.75    Years: 44.00    Pack years: 33.00    Types: Cigarettes    Quit date: 11/28/2015    Years since quitting: 5.7   Smokeless tobacco: Never  Vaping Use   Vaping Use: Never used  Substance Use Topics   Alcohol use: Not Currently    Comment: occ   Drug use: No    Comment: 06/21/2017 "nothing since the 1980s"    Home Medications Prior to Admission medications   Medication Sig Start Date End Date Taking? Authorizing Provider  acetaminophen (TYLENOL) 325 MG tablet Take 2 tablets (650 mg total) by mouth 3 (three) times daily. 08/01/21   John Giovanni, PA-C  aspirin EC 81 MG tablet Take 81 mg by mouth daily. Swallow whole.    [provider]  atorvastatin (LIPITOR) 80 MG tablet TAKE 1 TABLET(80 MG) BY MOUTH DAILY Patient taking differently: Take 80 mg by mouth daily. 04/01/21   [name], MD  docusate sodium (COLACE) 100 MG capsule Take 100 mg by mouth daily as needed for mild constipation. 1-2    [provider]  DULoxetine 40 MG CPEP Take 40 mg by mouth every evening. 08/01/21   John Giovanni, PA-C  ferrous gluconate (FERGON) 324 MG tablet Take 1 tablet (324 mg total) by mouth daily with breakfast. 06/11/21 09/09/21  Mariel Aloe, MD  gabapentin (NEURONTIN) 100 MG capsule Take 1 capsule (100 mg total) by mouth at bedtime. Take 2 capsules (200 mg total)  by mouth at bedtime. Take 1 capsule (100 mg) by mouth twice a day in addition to night dose. 08/26/21   Pickenpack-Cousar, Carlena Sax, NP  HYDROmorphone (DILAUDID) 4 MG tablet Take 1 tablet (4 mg total) by mouth every  4 (four) hours as needed for moderate pain. 08/26/21   Pickenpack-Cousar, Carlena Sax, NP  lidocaine (LIDODERM) 5 % Place 1 patch onto the skin daily. Remove & Discard patch within 12 hours or as directed by MD Patient taking differently: Place 1 patch onto the skin daily as needed (pain). Remove & Discard patch within 12 hours or as directed by MD 06/26/21   Quintella Reichert, MD  lidocaine-prilocaine (EMLA) cream Apply 1 application topically once as needed (port access). Patient taking differently: Apply 1 application topically daily as needed (port access). 02/27/21   Aline August, MD  metoprolol succinate (TOPROL-XL) 25 MG 24 hr tablet Take 1 tablet (25 mg total) by mouth daily. Patient taking differently: Take 25 mg by mouth every evening. 08/27/20   [name], MD  midodrine (PROAMATINE) 2.5 MG tablet Take 1 tablet (2.5 mg total) by mouth 3 (three) times daily with meals. Patient taking differently: Take 2.5 mg by mouth 3 (three) times daily as needed (Hypotension). 06/15/21 12/12/21  Loel Dubonnet, NP  morphine (MS CONTIN) 15 MG 12 hr tablet Take 1 tablet (15 mg total) by mouth every 12 (twelve) hours. 08/10/21   Pickenpack-Cousar, Carlena Sax, NP  Multiple Vitamin (MULTIVITAMIN WITH MINERALS) TABS tablet Take 1 tablet by mouth daily.    [provider]  nitroGLYCERIN (NITROSTAT) 0.4 MG SL tablet PLACE 1 TABLET UNDER THE TONGUE EVERY 5 MINUTES AS NEEDED FOR CHEST PAIN. 3 DOSES MAX Patient taking differently: 0.4 mg every 5 (five) minutes as needed for chest pain. 04/16/20   [name], MD  ondansetron (ZOFRAN) 8 MG tablet Take 1 tablet (8 mg total) by mouth every 8 (eight) hours as needed for nausea or vomiting. Starting 3 days after chemotherapy 08/27/21    Pickenpack-Cousar, Carlena Sax, NP  prochlorperazine (COMPAZINE) 10 MG tablet Take 1 tablet (10 mg total) by mouth every 6 (six) hours as needed for nausea or vomiting. 08/27/21   Pickenpack-Cousar, Carlena Sax, NP  ranolazine (RANEXA) 1000 MG SR tablet Take 1 tablet (1,000 mg total) by mouth 2 (two) times daily. 05/25/21   [name], MD  XARELTO 20 MG TABS tablet Take 20 mg by mouth every evening. 06/01/21   [provider]    Allergies    Indomethacin, Prednisone, Tetanus toxoids, Wellbutrin [bupropion], Gabapentin, and Varenicline  Review of Systems   Review of Systems  Cardiovascular:  Positive for chest pain.  All other systems reviewed and are negative.  Physical Exam Updated Vital Signs BP (!) 119/29 (BP Location: Right Arm)    Pulse 93    Temp 98.5 F (36.9 C) (Oral)    Resp (!) 24    Ht 5\' 9"  (1.753 m)    Wt 75.2 kg    SpO2 98%    BMI 24.48 kg/m   Physical Exam Vitals and nursing Villa reviewed.  Constitutional:      General: He is not in acute distress.    Appearance: Normal appearance. He is well-developed.  HENT:     Head: Normocephalic and atraumatic.  Eyes:     Conjunctiva/sclera: Conjunctivae normal.     Pupils: Pupils are equal, round, and reactive to light.  Cardiovascular:     Rate and Rhythm: Normal rate and regular rhythm.     Heart sounds: Normal heart sounds.  Pulmonary:     Effort: Pulmonary effort is normal. No respiratory distress.     Breath sounds: Normal breath sounds.  Abdominal:     General: There  is no distension.     Palpations: Abdomen is soft.     Tenderness: There is no abdominal tenderness.  Musculoskeletal:        General: No deformity. Normal range of motion.     Cervical back: Normal range of motion and neck supple.  Skin:    General: Skin is warm and dry.  Neurological:     General: No focal deficit present.     Mental Status: He is alert and oriented to person, place, and time.    ED Results / Procedures /  Treatments   Labs (all labs ordered are listed, but only abnormal results are displayed) Labs Reviewed  RESP PANEL BY RT-PCR (FLU A&B, COVID) [id number]  BASIC METABOLIC PANEL  CBC WITH DIFFERENTIAL/PLATELET    EKG None  Radiology No results found.  Procedures Procedures   Medications Ordered in ED Medications  HYDROmorphone (DILAUDID) injection 1 mg (1 mg Intravenous Given 08/29/21 2135)  ondansetron (ZOFRAN) injection 4 mg (4 mg Intravenous Given 08/29/21 2129)    ED Course  I have reviewed the triage vital signs and the nursing notes.  Pertinent labs & imaging results that were available during my care of the patient were reviewed by me and considered in my medical decision making (see chart for details).    MDM Rules/Calculators/A&P                         This patient presents to the ED for concern of right lateral chest wall pain, this involves an extensive number of treatment options, and is a complaint that carries with it a high risk of complications and morbidity.  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   Additional history obtained:   External records from outside source obtained and reviewed including recent inpatient admission.   Lab Tests:  I Ordered, and personally interpreted labs.  The pertinent results include:  CBC, BMP, CXR, EKG.   Imaging Studies ordered:  I ordered imaging studies including CXR.  I independently visualized and interpreted imaging which showed No Acute Pathology. I agree with the radiologist interpretation.   Cardiac  Monitoring:  The patient was maintained on a cardiac monitor.  I personally viewed and interpreted the cardiac monitored which showed an underlying rhythm of: NSR.   Medicines ordered and prescription drug management:  I ordered medication including dilaudid  for right lateral chest pain.  Reevaluation of the patient after these medicines showed that the patient improved I have reviewed the patients home medicines and have made adjustments as needed      Consultations Obtained:  I requested consultation with the Hospitalist service,  and discussed lab and imaging findings as well as pertinent plan.   Problem List / ED Course:  Uncontrolled right lateral chest wall pain secondary to metastatic disease.   Reevaluation:  After the interventions noted above, I reevaluated the patient and found that they have :improved   Dispostion:  After consideration of the diagnostic results and the patients response to treatment, I feel that the patent would benefit from admission.   Patient is presenting with uncontrolled pain secondary to metastatic cancer.  Patient is requesting  overnight observation for additional pain control.  Hospitalist service is aware of case and will evaluate for same.     Final Clinical Impression(s) / ED Diagnoses Final diagnoses:  Chest wall pain    Rx / DC Orders ED Discharge Orders     None        Valarie Merino, MD 08/29/21 [phone]

## 2021-08-29 NOTE — H&P (Signed)
History and Physical    LAKE CINQUEMANI KDT:[id number] DOB: Apr 01, 1956 DOA: 08/29/2021  PCP: [name], MD   Patient coming from: Home  I have personally briefly reviewed patient's old medical records in [place]  Chief Complaint: Intractable chest wall pain.  HPI: [name] is a 65 y.o. male with medical history significant of chronic pain syndrome, fibromyalgia, ectopic atrial tachycardia, anxiety/depression, hx of CAD s/p CABG/  H hyperlipidemia, hypertension, history of pulmonary embolism.  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  Patient expressed known hx of right-sided chest wall metastatic lesion and ongoing pain. Home meds at home not alleviating his symptoms.  Patient has reported pending radiotherapy initiation.  Patient denies fever, nausea, vomiting, dysuria, focal weakness or any other complaints.  Vaccinated against COVID but no boosted; COVID PCR in the ED negative.  ED Course: Afebrile and with a stable vital signs, blood work essentially unremarkable for him; slight improvement to IV medication.  Your weight has been called to place in the hospital for further evaluation and management.  Review of systems: As per HPI otherwise all other systems reviewed and are negative.   Past Medical History:  Diagnosis Date   Anemia    Anxiety    Arthritis    Basal cell carcinoma (BCC) of forehead    CAD (coronary artery disease)    a. 10/2015 ant STEMI >> LHC with 3 v CAD; oLAD tx with POBA >> emergent CABG. b. Multiple evals since that time, early graft failure of SVG-RCA by cath 03/2016. c. 2/19 PCI/DES x1 to pRCA, normal EF.   Carotid artery disease ([place])    a. 40-59% BICA 02/2018.   Depression    Dyspnea    Ectopic atrial tachycardia (HCC)    Esophageal reflux    eosinophil esophagitis   Family history of  adverse reaction to anesthesia    "sister has PONV" (06/21/2017)   Former tobacco use    Gout    Hepatitis C    "treated and cured" (06/21/2017)   High cholesterol    History of blood transfusion    History of kidney stones    Hypertension    Ischemic cardiomyopathy    a. EF 25-30% at intraop TEE 4/17  //  b. Limited Echo 5/17 - EF 45-50%, mild ant HK. c. EF 55-65% by cath 09/2017.   Migraine    "3-4/yr" (06/21/2017)   Myocardial infarction ([place]) 10/2015   Palpitations    Pneumonia    Sinus bradycardia    a. HR dropping into 40s in 02/2016 -> BB reduced.   Stroke [facility]) 10/2016   "small one; sometimes my memory/cognitive issues" (06/21/2017)   Symptomatic hypotension    a. 02/2016 ER visit -> meds reduced.   Syncope    Wears dentures    Wears glasses     Past Surgical History:  Procedure Laterality Date   25 GAUGE PARS PLANA VITRECTOMY WITH 20 GAUGE MVR PORT  12/31/2020   Procedure: 25 GAUGE PARS PLANA VITRECTOMY WITH 20 GAUGE MVR PORT;  Surgeon: [name], MD;  Location: [place];  Service: Thoracic;;   ANTERIOR CERVICAL DECOMP/DISCECTOMY FUSION N/A 10/17/2018   Procedure: Anterior Cervical Decompression Fusion - Cervical seven -Thoracic one;  Surgeon: Consuella Lose, MD;  Location: [place];  Service: Neurosurgery;  Laterality: N/A;   BASAL CELL CARCINOMA EXCISION     "  forehead   BIOPSY  07/20/2019   Procedure: BIOPSY;  Surgeon: Carol Ada, MD;  Location: [facility];  Service: Endoscopy;;   BIOPSY OF MEDIASTINAL MASS Right 07/29/2021   Procedure: RIGHT CHEST WALL MASS BIOPSY;  Surgeon: [name], MD;  Location: [place];  Service: Thoracic;  Laterality: Right;   BRONCHIAL BIOPSY  12/24/2020   Procedure: BRONCHIAL BIOPSIES;  Surgeon: Garner Nash, DO;  Location: [place] [facility];  Service: Pulmonary;;   BRONCHIAL BRUSHINGS  12/24/2020   Procedure: BRONCHIAL BRUSHINGS;  Surgeon: Garner Nash, DO;  Location: [place] [facility];  Service: Pulmonary;;   BRONCHIAL  NEEDLE ASPIRATION BIOPSY  12/24/2020   Procedure: BRONCHIAL NEEDLE ASPIRATION BIOPSIES;  Surgeon: Garner Nash, DO;  Location: [place];  Service: Pulmonary;;   CARDIAC CATHETERIZATION N/A 11/28/2015   Procedure: Left Heart Cath and Coronary Angiography;  Surgeon: [name], MD;  Location: [place] [facility];  Service: Cardiovascular;  Laterality: N/A;   CARDIAC CATHETERIZATION N/A 11/28/2015   Procedure: Coronary Balloon Angioplasty;  Surgeon: [name], MD;  Location: [place] [facility];  Service: Cardiovascular;  Laterality: N/A;  ostial LAD   CARDIAC CATHETERIZATION N/A 11/28/2015   Procedure: Coronary/Graft Angiography;  Surgeon: [name], MD;  Location: [place] [facility];  Service: Cardiovascular;  Laterality: N/A;  coronaries only    CARDIAC CATHETERIZATION N/A 04/21/2016   Procedure: Left Heart Cath and Coronary Angiography;  Surgeon: Wellington Hampshire, MD;  Location: [place] [facility];  Service: Cardiovascular;  Laterality: N/A;   CARDIAC CATHETERIZATION N/A 06/14/2016   Procedure: Left Heart Cath and Cors/Grafts Angiography;  Surgeon: Lorretta Harp, MD;  Location: [place] [facility];  Service: Cardiovascular;  Laterality: N/A;   CARDIAC CATHETERIZATION N/A 09/08/2016   Procedure: Left Heart Cath and Cors/Grafts Angiography;  Surgeon: Wellington Hampshire, MD;  Location: [place] [facility];  Service: Cardiovascular;  Laterality: N/A;   CARDIAC CATHETERIZATION     CORONARY ARTERY BYPASS GRAFT N/A 11/28/2015   Procedure: CORONARY ARTERY BYPASS GRAFTING (CABG) TIMES FIVE USING LEFT INTERNAL MAMMARY ARTERY AND RIGHT GREATER SAPHENOUS,VIEN HARVEATED BY ENDOVIEN, INTRAOPPRATIVE TEE;  Surgeon: Gaye Pollack, MD;  Location: [place];  Service: Open Heart Surgery;  Laterality: N/A;   CORONARY STENT INTERVENTION N/A 10/05/2017   Procedure: CORONARY STENT INTERVENTION;  Surgeon: [name], MD;  Location: [place] [facility];  Service: Cardiovascular;   Laterality: N/A;   ESOPHAGOGASTRODUODENOSCOPY (EGD) WITH PROPOFOL N/A 07/20/2019   Procedure: ESOPHAGOGASTRODUODENOSCOPY (EGD) WITH PROPOFOL;  Surgeon: Carol Ada, MD;  [facility];  Service: Endoscopy;  Laterality: N/A;   ESOPHAGOGASTRODUODENOSCOPY (EGD) WITH PROPOFOL N/A 01/30/2021   Procedure: ESOPHAGOGASTRODUODENOSCOPY (EGD) WITH PROPOFOL;  Surgeon: Carol Ada, MD;  [facility];  Service: Endoscopy;  Laterality: N/A;   FIDUCIAL MARKER PLACEMENT  12/24/2020   Procedure: FIDUCIAL DYE MARKER PLACEMENT;  Surgeon: Garner Nash, DO;  Location: [place] [facility];  Service: Pulmonary;;   HUMERUS SURGERY Right 1969   "tumor inside bone; filled it w/bone chips"   INTERCOSTAL NERVE BLOCK Right 12/24/2020   Procedure: INTERCOSTAL NERVE BLOCK;  Surgeon: [name], MD;  Location: [place];  Service: Thoracic;  Laterality: Right;   IR IMAGING GUIDED PORT INSERTION  02/06/2021   LEFT HEART CATH AND CORS/GRAFTS ANGIOGRAPHY N/A 03/11/2017   Procedure: Left Heart Cath and Cors/Grafts Angiography;  Surgeon: Leonie Man, MD;  Location: [place] [facility];  Service: Cardiovascular;  Laterality: N/A;   LEFT HEART CATH AND  CORS/GRAFTS ANGIOGRAPHY N/A 10/05/2017   Procedure: LEFT HEART CATH AND CORS/GRAFTS ANGIOGRAPHY;  Surgeon: [name], MD;  Location: [place] [facility];  Service: Cardiovascular;  Laterality: N/A;   LEFT HEART CATH AND CORS/GRAFTS ANGIOGRAPHY N/A 04/11/2019   Procedure: LEFT HEART CATH AND CORS/GRAFTS ANGIOGRAPHY;  Surgeon: [name], MD;  Location: [place] [facility];  Service: Cardiovascular;  Laterality: N/A;   NODE DISSECTION Right 12/24/2020   Procedure: NODE DISSECTION;  Surgeon: [name], MD;  Location: [place];  Service: Thoracic;  Laterality: Right;   PERIPHERAL VASCULAR CATHETERIZATION N/A 06/14/2016   Procedure: Lower Extremity Angiography;  Surgeon: Lorretta Harp, MD;  Location: [place] [facility];  Service:  Cardiovascular;  Laterality: N/A;   VIDEO BRONCHOSCOPY WITH ENDOBRONCHIAL NAVIGATION Right 12/24/2020   Procedure: VIDEO BRONCHOSCOPY WITH ENDOBRONCHIAL NAVIGATION;  Surgeon: Garner Nash, DO;  Location: [place];  Service: Pulmonary;  Laterality: Right;   VIDEO BRONCHOSCOPY WITH ENDOBRONCHIAL ULTRASOUND N/A 12/24/2020   Procedure: VIDEO BRONCHOSCOPY WITH ENDOBRONCHIAL ULTRASOUND;  Surgeon: Garner Nash, DO;  Location: [place];  Service: Pulmonary;  Laterality: N/A;   VIDEO BRONCHOSCOPY WITH INSERTION OF INTERBRONCHIAL VALVE (IBV) N/A 12/31/2020   Procedure: VIDEO BRONCHOSCOPY WITH INSERTION OF INTERBRONCHIAL VALVE (IBV).VALVE IN CARTRIDGE 95mm,9mm. CHEST TUBE PLACEMENT.;  Surgeon: [name], MD;  Location[facility];  Service: Thoracic;  Laterality: N/A;   VIDEO BRONCHOSCOPY WITH INSERTION OF INTERBRONCHIAL VALVE (IBV) N/A 07/29/2021   Procedure: VIDEO BRONCHOSCOPY WITH REMOVAL OF INTERBRONCHIAL VALVE (IBV);  Surgeon: [name], MD;  [facility];  Service: Thoracic;  Laterality: N/A;    Social History  reports that he quit smoking about 5 years ago. His smoking use included cigarettes. He has a 33.00 pack-year smoking history. He has never used smokeless tobacco. He reports that he does not currently use alcohol. He reports that he does not use drugs.  Allergies  Allergen Reactions   Indomethacin Other (See Comments)    rectal bleeding   Prednisone Other (See Comments)    States that this med makes him "crazy"   Tetanus Toxoids Swelling and Other (See Comments)    Fever, Swelling of the arm    Wellbutrin [Bupropion] Other (See Comments)    Crazy thoughts, nightmares   Gabapentin     Other reaction(s): Unknown   Varenicline Other (See Comments)    Dreams  Other reaction(s): Unknown    Family History  Problem Relation Age of Onset   Lung cancer Mother    Heart Problems Father    Heart attack Father 82   Stroke Father    Heart failure Father     Heart attack Maternal Grandmother    Stroke Maternal Grandmother    Heart attack Paternal Uncle    Hypertension Brother    Autoimmune disease Neg Hx     Prior to Admission medications   Medication Sig Start Date End Date Taking? Authorizing Provider  acetaminophen (TYLENOL) 325 MG tablet Take 2 tablets (650 mg total) by mouth 3 (three) times daily. 08/01/21   John Giovanni, PA-C  aspirin EC 81 MG tablet Take 81 mg by mouth daily. Swallow whole.    [provider]  atorvastatin (LIPITOR) 80 MG tablet TAKE 1 TABLET(80 MG) BY MOUTH DAILY Patient taking differently: Take 80 mg by mouth daily. 04/01/21   [name], MD  docusate sodium (COLACE) 100 MG capsule Take 100 mg by mouth daily as needed for mild constipation. 1-2  [provider]  DULoxetine 40 MG CPEP Take 40 mg by mouth every evening. 08/01/21   John Giovanni, PA-C  ferrous gluconate (FERGON) 324 MG tablet Take 1 tablet (324 mg total) by mouth daily with breakfast. 06/11/21 09/09/21  Mariel Aloe, MD  gabapentin (NEURONTIN) 100 MG capsule Take 1 capsule (100 mg total) by mouth at bedtime. Take 2 capsules (200 mg total) by mouth at bedtime. Take 1 capsule (100 mg) by mouth twice a day in addition to night dose. 08/26/21   Pickenpack-Cousar, Carlena Sax, NP  HYDROmorphone (DILAUDID) 4 MG tablet Take 1 tablet (4 mg total) by mouth every 4 (four) hours as needed for moderate pain. 08/26/21   Pickenpack-Cousar, Carlena Sax, NP  lidocaine (LIDODERM) 5 % Place 1 patch onto the skin daily. Remove & Discard patch within 12 hours or as directed by MD Patient taking differently: Place 1 patch onto the skin daily as needed (pain). Remove & Discard patch within 12 hours or as directed by MD 06/26/21   Quintella Reichert, MD  lidocaine-prilocaine (EMLA) cream Apply 1 application topically once as needed (port access). Patient taking differently: Apply 1 application topically daily as needed (port access). 02/27/21   Aline August, MD   metoprolol succinate (TOPROL-XL) 25 MG 24 hr tablet Take 1 tablet (25 mg total) by mouth daily. Patient taking differently: Take 25 mg by mouth every evening. 08/27/20   [name], MD  midodrine (PROAMATINE) 2.5 MG tablet Take 1 tablet (2.5 mg total) by mouth 3 (three) times daily with meals. Patient taking differently: Take 2.5 mg by mouth 3 (three) times daily as needed (Hypotension). 06/15/21 12/12/21  Loel Dubonnet, NP  morphine (MS CONTIN) 15 MG 12 hr tablet Take 1 tablet (15 mg total) by mouth every 12 (twelve) hours. 08/10/21   Pickenpack-Cousar, Carlena Sax, NP  Multiple Vitamin (MULTIVITAMIN WITH MINERALS) TABS tablet Take 1 tablet by mouth daily.    [provider]  nitroGLYCERIN (NITROSTAT) 0.4 MG SL tablet PLACE 1 TABLET UNDER THE TONGUE EVERY 5 MINUTES AS NEEDED FOR CHEST PAIN. 3 DOSES MAX Patient taking differently: 0.4 mg every 5 (five) minutes as needed for chest pain. 04/16/20   [name], MD  ondansetron (ZOFRAN) 8 MG tablet Take 1 tablet (8 mg total) by mouth every 8 (eight) hours as needed for nausea or vomiting. Starting 3 days after chemotherapy 08/27/21   Pickenpack-Cousar, Carlena Sax, NP  prochlorperazine (COMPAZINE) 10 MG tablet Take 1 tablet (10 mg total) by mouth every 6 (six) hours as needed for nausea or vomiting. 08/27/21   Pickenpack-Cousar, Carlena Sax, NP  ranolazine (RANEXA) 1000 MG SR tablet Take 1 tablet (1,000 mg total) by mouth 2 (two) times daily. 05/25/21   [name], MD  XARELTO 20 MG TABS tablet Take 20 mg by mouth every evening. 06/01/21   [provider]    Physical Exam: Vitals:   08/29/21 2110 08/29/21 2121 08/29/21 2225  BP:  (!) 119/29 113/74  Pulse:  93 92  Resp:  (!) 24 18  Temp:  98.5 F (36.9 C)   TempSrc:  Oral   SpO2:  98% 98%  Weight: 75.2 kg    Height: 5\' 9"  (1.753 m)      Constitutional: NAD, calm, comfortable; no requiring oxygen supplementation.  Reports no nausea or vomiting. Vitals:    08/29/21 2110 08/29/21 2121 08/29/21 2225  BP:  (!) 119/29 113/74  Pulse:  93 92  Resp:  (!) 24  18  Temp:  98.5 F (36.9 C)   TempSrc:  Oral   SpO2:  98% 98%  Weight: 75.2 kg    Height: 5\' 9"  (1.753 m)     Eyes: PERRL, lids and conjunctivae normal ENMT: Mucous membranes are moist. Posterior pharynx clear of any exudate or lesions. Neck: normal, supple, no masses, no thyromegaly, no JVD. Respiratory: clear to auscultation bilaterally, no wheezing, no crackles. Normal respiratory effort.  No using accessory muscles.  There is hypertension. Cardiovascular: S1 and S2, no rubs, no gallops, no JVD. Abdomen: no tenderness, no masses palpated. No hepatosplenomegaly. Bowel sounds positive.  Musculoskeletal: no clubbing / cyanosis. No joint deformity upper and lower extremities. Good ROM, no contractures.  Skin: no rashes, lesions, ulcers. No induration Neurologic: CN 2-12 grossly intact. Sensation intact, DTR normal. Strength 5/5 in all 4.  Psychiatric: Normal judgment and insight. Alert and oriented x 3. Normal mood.   Labs on Admission: I have personally reviewed following labs and imaging studies  CBC: Recent Labs  Lab 08/29/21 2135  WBC 9.4  NEUTROABS 6.2  HGB 12.4*  HCT 38.3*  MCV 92.7  PLT 193    Basic Metabolic Panel: Recent Labs  Lab 08/29/21 2135  NA 137  K 4.2  CL 105  CO2 26  GLUCOSE 98  BUN 23  CREATININE 1.27*  CALCIUM 10.0    GFR: Estimated Creatinine Clearance: 57.2 mL/min (A) (by C-G formula based on SCr of 1.27 mg/dL (H)).  Urine analysis:    Component Value Date/Time   COLORURINE YELLOW 05/05/2021 0508   APPEARANCEUR CLEAR 05/05/2021 0508   LABSPEC 1.010 05/05/2021 0508   PHURINE 6.0 05/05/2021 0508   GLUCOSEU NEGATIVE 05/05/2021 0508   HGBUR NEGATIVE 05/05/2021 0508   BILIRUBINUR NEGATIVE 05/05/2021 0508   KETONESUR NEGATIVE 05/05/2021 0508   PROTEINUR NEGATIVE 05/05/2021 0508   UROBILINOGEN 0.2 05/13/2014 0658   NITRITE NEGATIVE  05/05/2021 0508   LEUKOCYTESUR NEGATIVE 05/05/2021 0508    Radiological Exams on Admission: DG Chest Port 1 View  Result Date: 08/29/2021 CLINICAL DATA:  Chest pain. EXAM: PORTABLE CHEST 1 VIEW COMPARISON:  Chest x-ray 08/18/2021. FINDINGS: Cardiomediastinal silhouette is within normal limits. Patient is status post cardiac surgery. Right chest port catheter tip projects over the distal SVC. There is no focal lung infiltrate, pleural effusion or pneumothorax. Cervical spinal fusion plate is present. No acute fractures are seen. IMPRESSION: No acute cardiopulmonary process. Electronically Signed   By: Ronney Asters M.D.   On: 08/29/2021 22:06    EKG: Independently reviewed.  Sinus rhythm, no acute ischemic changes.  Assessment/Plan 1-right-sided chest wall pain -Secondary to metastatic disease -Palliative care team has been consulted thoraces with goals of care discussion, advance care planning medication management -Continue the use of IV hydromorphone for breakthrough and to follow adjusted dose of extended release morphine for better control of her symptoms. -Continue Neurontin.  2-mild acute kidney injury on chronic kidney disease a stage IIIa -Provide fluid resuscitation and hold nephrotoxic agents -Follow-up renal function trend. -Creatinine at baseline around 1.0; currently on admission creatinine 1.27.  3-atrial fibrillation s/p.  Pulmonary embolism -Continue the use of Xarelto -Continue metoprolol  4-history of coronary artery disease -Continue the use of a statin, aspirin, metoprolol -EKG without acute ischemic changes. -Patient with history of CABG x5  5-hyperlipidemia -Continue statins.  6-anxiety/depression/bipolar disorder -Mood stable currently -Continue Cymbalta.  DVT prophylaxis: Chronically on Xarelto Code Status:   Full code Family Communication:  No family at bedside Disposition Plan:  Patient is from:  Home  Anticipated DC to:  Home  Anticipated DC  date:  08/30/21  Anticipated DC barriers: Pain control  Consults called:  Palliative care. Admission status:  Observation, MedSurg, length of stay less than 2 midnights. Severity of Illness: The appropriate patient status for this patient is OBSERVATION. Observation status is judged to be reasonable and necessary in order to provide the required intensity of service to ensure the patient's safety. The patient's presenting symptoms, physical exam findings, and initial radiographic and laboratory data in the context of their medical condition is felt to place them at decreased risk for further clinical deterioration. Furthermore, it is anticipated that the patient will be medically stable for discharge from the hospital within 2 midnights of admission.     Barton Dubois MD [facility]  How to contact the [facility] Attending or Consulting provider [place] or covering provider during after hours [place], for this patient?   Check the care team in [facility] and look for a) attending/consulting [facility] provider listed and b) the [facility] team listed Log into [email or web address] and use Center's universal password to access. If you do not have the password, please contact the hospital operator. Locate the [facility] provider you are looking for under [facility] and page to a number that you can be directly reached. If you still have difficulty reaching the provider, please page the [facility] (Director on Call) for the Hospitalists listed on amion for assistance.  08/29/2021, 11:26 PM

## 2021-08-30 DIAGNOSIS — B1920 Unspecified viral hepatitis C without hepatic coma: Secondary | ICD-10-CM | POA: Diagnosis present

## 2021-08-30 DIAGNOSIS — E7800 Pure hypercholesterolemia, unspecified: Secondary | ICD-10-CM | POA: Diagnosis present

## 2021-08-30 DIAGNOSIS — N1831 Chronic kidney disease, stage 3a: Secondary | ICD-10-CM | POA: Diagnosis present

## 2021-08-30 DIAGNOSIS — F319 Bipolar disorder, unspecified: Secondary | ICD-10-CM | POA: Diagnosis present

## 2021-08-30 DIAGNOSIS — Z8249 Family history of ischemic heart disease and other diseases of the circulatory system: Secondary | ICD-10-CM | POA: Diagnosis not present

## 2021-08-30 DIAGNOSIS — I9589 Other hypotension: Secondary | ICD-10-CM | POA: Diagnosis present

## 2021-08-30 DIAGNOSIS — Z7189 Other specified counseling: Secondary | ICD-10-CM

## 2021-08-30 DIAGNOSIS — I959 Hypotension, unspecified: Secondary | ICD-10-CM | POA: Diagnosis not present

## 2021-08-30 DIAGNOSIS — I480 Paroxysmal atrial fibrillation: Secondary | ICD-10-CM | POA: Diagnosis present

## 2021-08-30 DIAGNOSIS — F419 Anxiety disorder, unspecified: Secondary | ICD-10-CM | POA: Diagnosis present

## 2021-08-30 DIAGNOSIS — G893 Neoplasm related pain (acute) (chronic): Secondary | ICD-10-CM | POA: Diagnosis present

## 2021-08-30 DIAGNOSIS — G894 Chronic pain syndrome: Secondary | ICD-10-CM | POA: Diagnosis present

## 2021-08-30 DIAGNOSIS — I252 Old myocardial infarction: Secondary | ICD-10-CM | POA: Diagnosis not present

## 2021-08-30 DIAGNOSIS — C7931 Secondary malignant neoplasm of brain: Secondary | ICD-10-CM | POA: Diagnosis present

## 2021-08-30 DIAGNOSIS — R079 Chest pain, unspecified: Secondary | ICD-10-CM | POA: Diagnosis present

## 2021-08-30 DIAGNOSIS — I255 Ischemic cardiomyopathy: Secondary | ICD-10-CM | POA: Diagnosis present

## 2021-08-30 DIAGNOSIS — C3491 Malignant neoplasm of unspecified part of right bronchus or lung: Secondary | ICD-10-CM | POA: Diagnosis not present

## 2021-08-30 DIAGNOSIS — Z79899 Other long term (current) drug therapy: Secondary | ICD-10-CM | POA: Diagnosis not present

## 2021-08-30 DIAGNOSIS — Z515 Encounter for palliative care: Secondary | ICD-10-CM

## 2021-08-30 DIAGNOSIS — Z85828 Personal history of other malignant neoplasm of skin: Secondary | ICD-10-CM | POA: Diagnosis not present

## 2021-08-30 DIAGNOSIS — M797 Fibromyalgia: Secondary | ICD-10-CM | POA: Diagnosis present

## 2021-08-30 DIAGNOSIS — C7951 Secondary malignant neoplasm of bone: Secondary | ICD-10-CM | POA: Diagnosis present

## 2021-08-30 DIAGNOSIS — Z20822 Contact with and (suspected) exposure to covid-19: Secondary | ICD-10-CM | POA: Diagnosis present

## 2021-08-30 DIAGNOSIS — Z801 Family history of malignant neoplasm of trachea, bronchus and lung: Secondary | ICD-10-CM | POA: Diagnosis not present

## 2021-08-30 DIAGNOSIS — R0789 Other chest pain: Secondary | ICD-10-CM | POA: Diagnosis not present

## 2021-08-30 DIAGNOSIS — I4891 Unspecified atrial fibrillation: Secondary | ICD-10-CM | POA: Diagnosis not present

## 2021-08-30 DIAGNOSIS — N179 Acute kidney failure, unspecified: Secondary | ICD-10-CM | POA: Diagnosis present

## 2021-08-30 DIAGNOSIS — I129 Hypertensive chronic kidney disease with stage 1 through stage 4 chronic kidney disease, or unspecified chronic kidney disease: Secondary | ICD-10-CM | POA: Diagnosis present

## 2021-08-30 DIAGNOSIS — Z823 Family history of stroke: Secondary | ICD-10-CM | POA: Diagnosis not present

## 2021-08-30 DIAGNOSIS — I2510 Atherosclerotic heart disease of native coronary artery without angina pectoris: Secondary | ICD-10-CM | POA: Diagnosis present

## 2021-08-30 DIAGNOSIS — Z7982 Long term (current) use of aspirin: Secondary | ICD-10-CM | POA: Diagnosis not present

## 2021-08-30 DIAGNOSIS — C3411 Malignant neoplasm of upper lobe, right bronchus or lung: Secondary | ICD-10-CM | POA: Diagnosis not present

## 2021-08-30 LAB — BASIC METABOLIC PANEL
Anion gap: 5 (ref 5–15)
BUN: 23 mg/dL (ref 8–23)
CO2: 26 mmol/L (ref 22–32)
Calcium: 10.3 mg/dL (ref 8.9–10.3)
Chloride: 106 mmol/L (ref 98–111)
Creatinine, Ser: 1.12 mg/dL (ref 0.61–1.24)
GFR, Estimated: >60 mL/min (ref >60)
Glucose, Bld: 98 mg/dL (ref 70–99)
Potassium: 3.9 mmol/L (ref 3.5–5.1)
Sodium: 137 mmol/L (ref 135–145)

## 2021-08-30 LAB — CBC
HCT: 35.5 % — ABNORMAL LOW (ref 39.0–52.0)
Hemoglobin: 11.5 g/dL — ABNORMAL LOW (ref 13.0–17.0)
MCH: 30 pg (ref 26.0–34.0)
MCHC: 32.4 g/dL (ref 30.0–36.0)
MCV: 92.7 fL (ref 80.0–100.0)
Platelets: 253 10*3/uL (ref 150–400)
RBC: 3.83 MIL/uL — ABNORMAL LOW (ref 4.22–5.81)
RDW: 12.8 % (ref 11.5–15.5)
WBC: 8.3 10*3/uL (ref 4.0–10.5)
nRBC: 0 % (ref 0.0–0.2)

## 2021-08-30 MED ORDER — HYDROMORPHONE HCL 1 MG/ML IJ SOLN
1.0000 mg | INTRAMUSCULAR | Status: DC | PRN
  Administered 2021-08-30 – 2021-08-31 (×7): 2 mg via INTRAVENOUS
  Filled 2021-08-30 (×7): qty 2

## 2021-08-30 MED ORDER — MORPHINE SULFATE 15 MG PO TABS
15.0000 mg | ORAL_TABLET | ORAL | Status: DC | PRN
  Administered 2021-08-30 – 2021-08-31 (×3): 15 mg via ORAL
  Filled 2021-08-30 (×3): qty 1

## 2021-08-30 NOTE — Progress Notes (Signed)
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  He is reportedly scheduled to begin XRT to this area.   Consultants:  Palliative Care  Code Status: FULL CODE  Antimicrobials:  None  DVT prophylaxis: Xarelto  Interim Hx: Afebrile.  Vital signs stable.  Saturation 99% on room air.  Renal function improved back to his baseline.  Hemoglobin stable.  No acute process noted on admission CXR.  At the time of my exam the patient is resting comfortably in bed watching football.  He tells me the adjustments in his pain medications have been very helpful and that his pain is now well controlled at rest.  He denies shortness of breath nausea vomiting or abdominal pain.  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 Initially diagnosed April 2022 w/ small metastatic brain lesion - underwent RUL lobectomy in April, w/ chemo in June, and SRS to initial brain lesion in June - local recurrence at R chest wall noted November 2022 - S/p stereotactic radiosurgery to 4 brain lesions 08/26/2021 -pain medication regimen adjusted this morning and thus far proving to be quite helpful  Acute kidney injury on CKD stage IIIa Baseline creatinine 1.0 - creatinine 1.27 at presentation -much improved with volume support  Recent Labs  Lab 08/29/21 2135 08/30/21 0430  CREATININE 1.27* 1.12     Atrial fibrillation Rate controlled -remains on Xarelto  History of PE Continue Xarelto  History of CAD status post CABG x5 Continue statin, aspirin, metoprolol  HLD Continue statin  Bipolar disorder/anxiety/depression Continue usual home medications   Family Communication: No family present at time of exam   Objective: Blood pressure 123/80, pulse 87, temperature 98.5 F (36.9 C), temperature source Oral, resp. rate 17, height 5\' 9"  (1.753 m), weight 75.2 kg, SpO2 98 %.  Intake/Output Summary (Last 24 hours) at 08/30/2021 0848 Last data filed at 08/30/2021 0423 Gross per 24 hour  Intake 50 ml  Output --  Net 50 ml   Filed Weights   08/29/21 2110  Weight: 75.2 kg    Examination: General: No acute respiratory distress Lungs: Clear to auscultation bilaterally without wheezes or crackles Cardiovascular: Regular rate and rhythm without murmur gallop or rub normal S1 and S2 Abdomen: Nontender, nondistended, soft, bowel sounds positive, no rebound, no ascites, no appreciable mass Extremities: No significant cyanosis, clubbing, or edema bilateral lower extremities  CBC: Recent Labs  Lab 08/29/21 2135 08/30/21 0430  WBC 9.4 8.3  NEUTROABS 6.2  --   HGB 12.4* 11.5*  HCT 38.3* 35.5*  MCV 92.7 92.7  PLT 267 086   Basic Metabolic Panel: Recent Labs  Lab 08/29/21 2135 08/30/21 0430  NA 137 137  K 4.2 3.9  CL 105 106  CO2 26 26  GLUCOSE 98 98  BUN 23 23  CREATININE 1.27* 1.12  CALCIUM 10.0 10.3   GFR: Estimated Creatinine Clearance: 64.9 mL/min (by C-G formula based on SCr of 1.12 mg/dL).  HbA1C: Hgb A1c MFr Bld  Date/Time Value Ref Range Status  11/10/2016 02:07 AM 5.0 4.8 - 5.6 % Final    Comment:    (NOTE)         Pre-diabetes: 5.7 - 6.4         Diabetes: >6.4         Glycemic control for adults with diabetes: <7.0   09/07/2016 07:42 PM 5.1 4.8 - 5.6 % Final    Comment:    (NOTE)         Pre-diabetes: 5.7 - 6.4         Diabetes: >6.4          Glycemic control for adults with diabetes: <7.0     Recent Results (from the past 240 hour(s))  Culture, blood (Routine X 2) w Reflex to ID Panel     Status: None   Collection Time: 08/21/21  4:39 PM   Specimen: BLOOD  Result Value Ref Range Status   Specimen Description   Final    BLOOD RIGHT ANTECUBITAL Performed at [facility], [place] [address] [place], [place] [address]    Special Requests   Final    BOTTLES DRAWN AEROBIC ONLY Blood Culture adequate volume Performed at [facility] [address], [place], [place] [address]    Culture   Final    NO GROWTH 5 DAYS Performed at [facility], [place] [address], [place], [place] [address]    Report Status 08/26/2021 FINAL  Final  Culture, blood (Routine X 2) w Reflex to ID Panel     Status: None   Collection Time: 08/21/21  4:39 PM   Specimen: BLOOD RIGHT HAND  Result Value Ref Range Status   Specimen Description   Final    BLOOD RIGHT HAND Performed at [facility] [address], [place], [place] [address]    Special Requests   Final    BOTTLES DRAWN AEROBIC ONLY Blood Culture adequate volume Performed at [facility] [address], [place], [place] [address]    Culture   Final    NO GROWTH 5 DAYS Performed at [facility], [place] [address], [place], [place] [address]    Report Status 08/26/2021 FINAL  Final  Resp Panel by RT-PCR (Flu A&B, Covid) Nasopharyngeal Swab     Status: None   Collection Time: 08/29/21  9:12 PM   Specimen: Nasopharyngeal Swab; Nasopharyngeal(NP) swabs in vial transport medium  Result Value Ref Range Status   SARS Coronavirus 2 by RT PCR NEGATIVE NEGATIVE Final    Comment: (NOTE) SARS-CoV-2 target nucleic acids are NOT DETECTED.  The SARS-CoV-2 RNA is generally detectable in upper respiratory specimens during the acute phase of infection. The lowest concentration of SARS-CoV-2 viral copies this assay can detect  is 138 copies/mL. A negative result does not preclude SARS-Cov-2 infection and should not be used as the sole basis for treatment or other patient management decisions. A negative result may occur with  improper specimen collection/handling, submission of specimen other than nasopharyngeal swab, presence of viral mutation(s) within the areas targeted by this assay, and inadequate number of viral copies(<138 copies/mL). A negative result must be combined with clinical observations, patient history, and epidemiological information. The expected result is Negative.  Fact Sheet for Patients:  EntrepreneurPulse.com.au  Fact Sheet for Healthcare Providers:  IncredibleEmployment.be  This test is no t yet approved or cleared by the [place] and  has been authorized for detection and/or diagnosis of SARS-CoV-2 by FDA under an Emergency Use Authorization (EUA). This EUA will remain  in effect (meaning this test can be used) for the duration of the COVID-19 declaration under Section 564(b)(1) of the Act, 21 U.S.C.section 360bbb-3(b)(1), unless the authorization is terminated  or revoked sooner.       Influenza A by PCR NEGATIVE NEGATIVE Final   Influenza B by PCR NEGATIVE NEGATIVE Final    Comment: (NOTE) The Xpert Xpress SARS-CoV-2/FLU/RSV plus assay is intended as an aid in the diagnosis of influenza from Nasopharyngeal swab specimens and should not be used as a sole basis for treatment. Nasal washings and aspirates are unacceptable for Xpert Xpress SARS-CoV-2/FLU/RSV testing.  Fact Sheet for Patients: EntrepreneurPulse.com.au  Fact Sheet for Healthcare Providers: IncredibleEmployment.be  This test is not yet approved or cleared by the [place] FDA and has been authorized for detection and/or diagnosis of SARS-CoV-2 by FDA under an Emergency Use Authorization (EUA). This EUA will remain in effect  (meaning this test can be used) for the duration of the COVID-19 declaration under Section 564(b)(1) of the Act, 21 U.S.C. section 360bbb-3(b)(1), unless the authorization is terminated or revoked.  Performed at [facility], [place] [address] [place], [place] [address]      Scheduled Meds:  acetaminophen  650 mg Oral TID   aspirin EC  81 mg Oral Daily   atorvastatin  80 mg Oral Daily   DULoxetine  40 mg Oral QPM   gabapentin  100 mg Oral BID AC   gabapentin  200 mg Oral QHS   lidocaine  1 patch Transdermal Q24H   metoprolol succinate  25 mg Oral Daily   midodrine  2.5 mg Oral TID WC   morphine  15 mg Oral Q8H   multivitamin with minerals  1 tablet Oral Daily   ranolazine  1,000 mg Oral BID   rivaroxaban  20 mg Oral QPM   Continuous Infusions:  sodium chloride 75 mL/hr at 08/30/21 0354   methocarbamol (ROBAXIN) IV Stopped (08/30/21 0423)     LOS: 0 days   Cherene Altes, MD [facility] Office  [phone] Pager - Text Page per Shea Evans  If 7PM-7AM, please contact night-coverage per Amion 08/30/2021, 8:48 AM

## 2021-08-30 NOTE — Consult Note (Signed)
Palliative Care Consult Note                                  Date: 08/30/2021   Patient Name: [name]  DOB: [birth year]  MRN: [id number]  Age / Sex: 66 y.o., male  PCP: [name], MD Referring Physician: Cherene Altes, MD  Reason for Consultation: Establishing goals of care, Non pain symptom management, and Pain control  HPI/Patient Profile: Palliative Care consult requested for goals of care discussion in this 66 y.o. male  with past medical history of CAD, stage IV large cell neuroendocrine carcinoma (11/2020) with metastatic brain lesion, recurrent right chest wall disease (06/2021) s/p lobectomy and chemotherapy, anxiety, history of basal cell carcinoma of forehead, depression, GERD, STEMI s/p CABG, and hypertension. He was admitted on 08/29/2021 from home with uncontrolled intractable chest wall pain. Patient recently underwent simulation with plans to initiate further radiation. He was unable to make radiation appointment on 12/29 due to nausea and not feeling well. I am also actively following at [facility] for ongoing symptom management with most recent adjustments to gabapentin. No changes were made to MS Contin or Dilaudid with plans to re-evaluate on 09/01/2021.   Past Medical History:  Diagnosis Date   Anemia    Anxiety    Arthritis    Basal cell carcinoma (BCC) of forehead    CAD (coronary artery disease)    a. 10/2015 ant STEMI >> LHC with 3 v CAD; oLAD tx with POBA >> emergent CABG. b. Multiple evals since that time, early graft failure of SVG-RCA by cath 03/2016. c. 2/19 PCI/DES x1 to pRCA, normal EF.   Carotid artery disease ([facility])    a. 40-59% BICA 02/2018.   Depression    Dyspnea    Ectopic atrial tachycardia (HCC)    Esophageal reflux    eosinophil esophagitis   Family history of adverse reaction to anesthesia    "sister has PONV" (06/21/2017)   Former tobacco use    Gout    Hepatitis C    "treated  and cured" (06/21/2017)   High cholesterol    History of blood transfusion    History of kidney stones    Hypertension    Ischemic cardiomyopathy    a. EF 25-30% at intraop TEE 4/17  //  b. Limited Echo 5/17 - EF 45-50%, mild ant HK. c. EF 55-65% by cath 09/2017.   Migraine    "3-4/yr" (06/21/2017)   Myocardial infarction ([facility]) 10/2015   Palpitations    Pneumonia    Sinus bradycardia    a. HR dropping into 40s in 02/2016 -> BB reduced.   Stroke [facility]) 10/2016   "small one; sometimes my memory/cognitive issues" (06/21/2017)   Symptomatic hypotension    a. 02/2016 ER visit -> meds reduced.   Syncope    Wears dentures    Wears glasses      Subjective:   This NP Osborne Oman reviewed medical records, received report from team, assessed the patient and then met at the patient's bedside with [name]  to discuss diagnosis,  symptom management and wishes, disposition and options.  He is awake, alert watching television and scrolling on his phone. Weak appearing. No acute distress noted. Is receiving IV dilaudid which does ease pain. He states he has been following regimen in the home with little to no relief. We have worked closely with Mr. Vandehei outpatient  to adjust medications to provide some pain relief. Many of the medications that were initially started he expressed he did not tolerate due to dizziness or no relief. Regimen has been changed to what initially seemed appropriate. He was recently discharged on 08/23/21 and at that time he was sent on home on continued regimen with reports of feeling ok.   During recent clinic visit he shared ongoing pain with some relief from MS Contin and Dilaudid. Gabapentin adjusted at that time: increased to 226m qhs, and 100 mg BID.    DBabakis remaining hopeful but understands the severity of his condition. His biggest focus is on pain control as nothing seems to be working, however feels that IV medications worked the best. He understands this is  not an option outpatient unless he declines and admitted to a residential hospice facility which he is not ready for. He understands his pain may not completely be resolved however goal is to manage and allow him to function and remain out of the hospital, specifically if his goals are to continue with ongoing treatment.   Reports appreciation of discussions with attending and gaining better understanding of his illness and expectations. Recent adjustments to his medication have been made (MS Contin every 8, MSIR every 3 hours, with Dilaudid available for breakthrough pain not relieved by oral medications, robaxin, and scheduled tylenol). He understands we will not make any further adjustments at this time and will continue to closely monitory for effectiveness.   I discussed the importance of continued conversation with family and their medical providers regarding overall plan of care and treatment options, ensuring decisions are within the context of the patients values and GOCs.  Questions and concerns were addressed. Patient was encouraged to call with questions or concerns.  PMT will continue to support holistically as needed.  Objective:   Primary Diagnoses: Present on Admission:  Right-sided chest wall pain   Scheduled Meds:  acetaminophen  650 mg Oral TID   aspirin EC  81 mg Oral Daily   atorvastatin  80 mg Oral Daily   DULoxetine  40 mg Oral QPM   gabapentin  100 mg Oral BID AC   gabapentin  200 mg Oral QHS   lidocaine  1 patch Transdermal Q24H   metoprolol succinate  25 mg Oral Daily   midodrine  2.5 mg Oral TID WC   morphine  15 mg Oral Q8H   multivitamin with minerals  1 tablet Oral Daily   ranolazine  1,000 mg Oral BID   rivaroxaban  20 mg Oral QPM    Continuous Infusions:  methocarbamol (ROBAXIN) IV Stopped (08/30/21 0423)    PRN Meds: docusate sodium, HYDROmorphone (DILAUDID) injection, methocarbamol (ROBAXIN) IV, morphine, prochlorperazine  Allergies  Allergen  Reactions   Indomethacin Other (See Comments)    rectal bleeding   Prednisone Other (See Comments)    States that this med makes him "crazy"   Tetanus Toxoids Swelling and Other (See Comments)    Fever, Swelling of the arm    Wellbutrin [Bupropion] Other (See Comments)    Crazy thoughts, nightmares   Gabapentin     Other reaction(s): Unknown   Varenicline Other (See Comments)    Dreams  Other reaction(s): Unknown    Review of Systems  Musculoskeletal:  Positive for arthralgias.  Neurological:  Positive for weakness.  Unless otherwise noted, a complete review of systems is negative.  Physical Exam General: NAD, weak appearing  Cardiovascular: Tachycardic Pulmonary: normal breathing pattern  Extremities:  no edema, no joint deformities Skin: no rashes, warm and dry Neurological: AAOx3  Vital Signs:  BP 106/77 (BP Location: Right Arm)    Pulse 94    Temp 98 F (36.7 C) (Oral)    Resp 16    Ht 5' 9"  (1.753 m)    Wt 75.2 kg    SpO2 100%    BMI 24.48 kg/m  Pain Scale: 0-10   Pain Score: 5   SpO2: SpO2: 100 % O2 Device:SpO2: 100 % O2 Flow Rate: .   IO: Intake/output summary:  Intake/Output Summary (Last 24 hours) at 08/30/2021 1442 Last data filed at 08/30/2021 0423 Gross per 24 hour  Intake 50 ml  Output --  Net 50 ml    LBM:   Baseline Weight: Weight: 75.2 kg Most recent weight: Weight: 75.2 kg      Palliative Assessment/Data: PPS 30-40%   Advanced Care Planning:   Primary Decision Maker: PATIENT  Code Status/Advance Care Planning: Full code   Assessment & Plan:   SUMMARY OF RECOMMENDATIONS   Continue with current plan of care per attending PMT will continue to support and follow for ongoing goals of care discussions and symptom management. Discussed also with Palliative colleague Dr. Domingo Cocking. Please call team line with urgent needs.  Symptom Management:  Nausea Zofran as needed  Neoplasm related pain MS Contin 15 mg increased to every 8 hrs MSIR 15  mg every 3 hours as needed Dilaudid IV every 3 hours as needed for breakthrough pain not relieved by oral medications  Robaxin every 8 hours as needed for muscle spasms Scheduled Tylenol  Neurontin 290m QHS and 1095mBID May consider dexamethasone 16m48maily and/or Celebrex 200 mg daily. Will continue to evaluate and make adjustments as needed.   Palliative Prophylaxis:  Frequent Pain Assessment  Additional Recommendations (Limitations, Scope, Preferences): Treat the treatable   Psycho-social/Spiritual:  Desire for further Chaplaincy support: no Additional Recommendations:  ongoing goals of discussion  Prognosis:  Guarded-Poor  Discharge Planning:  To Be Determined with ongoing palliative support   Patient expressed understanding and was in agreement with this plan.   Time Total: 40 min.   Visit consisted of counseling and education dealing with the complex and emotionally intense issues of symptom management and palliative care in the setting of serious and potentially life-threatening illness.Greater than 50%  of this time was spent counseling and coordinating care related to the above assessment and plan.  Signed by:  NikAlda LeaGPCNP-BC Palliative Medicine Team  Phone: [phone]: [phone]: N. Bjorn PippinThank you for allowing the Palliative Medicine Team to assist in the care of this patient. Please utilize secure chat with additional questions, if there is no response within 30 minutes please call the above phone number. Palliative Medicine Team providers are available by phone from 7am to 5pm daily and can be reached through the team cell phone.  Should this patient require assistance outside of these hours, please call the patient's attending physician.

## 2021-08-31 DIAGNOSIS — R0789 Other chest pain: Secondary | ICD-10-CM | POA: Diagnosis not present

## 2021-08-31 MED ORDER — MORPHINE SULFATE 15 MG PO TABS
15.0000 mg | ORAL_TABLET | ORAL | Status: DC | PRN
  Administered 2021-08-31: 30 mg via ORAL
  Administered 2021-09-01 (×2): 15 mg via ORAL
  Administered 2021-09-02 (×2): 30 mg via ORAL
  Administered 2021-09-02: 15 mg via ORAL
  Administered 2021-09-02 – 2021-09-03 (×4): 30 mg via ORAL
  Filled 2021-08-31 (×2): qty 2
  Filled 2021-08-31: qty 1
  Filled 2021-08-31 (×2): qty 2
  Filled 2021-08-31: qty 1
  Filled 2021-08-31: qty 2
  Filled 2021-08-31: qty 1
  Filled 2021-08-31 (×3): qty 2

## 2021-08-31 MED ORDER — POLYETHYLENE GLYCOL 3350 17 G PO PACK
17.0000 g | PACK | Freq: Every day | ORAL | Status: DC
  Administered 2021-08-31 – 2021-09-03 (×4): 17 g via ORAL
  Filled 2021-08-31 (×4): qty 1

## 2021-08-31 MED ORDER — SODIUM CHLORIDE 0.9 % IV BOLUS
500.0000 mL | Freq: Once | INTRAVENOUS | Status: AC
  Administered 2021-08-31: 500 mL via INTRAVENOUS

## 2021-08-31 MED ORDER — SENNOSIDES-DOCUSATE SODIUM 8.6-50 MG PO TABS
1.0000 | ORAL_TABLET | Freq: Two times a day (BID) | ORAL | Status: DC
  Administered 2021-08-31 – 2021-09-03 (×7): 1 via ORAL
  Filled 2021-08-31 (×7): qty 1

## 2021-08-31 MED ORDER — HYDROMORPHONE HCL 1 MG/ML IJ SOLN
1.0000 mg | INTRAMUSCULAR | Status: DC | PRN
  Administered 2021-08-31 – 2021-09-01 (×5): 1 mg via INTRAVENOUS
  Filled 2021-08-31 (×6): qty 1

## 2021-08-31 NOTE — Progress Notes (Signed)
°  Transition of Care [facility]) Screening Note   Patient Details  Name: [name] Date of Birth: 22-Apr-1956   Transition of Care [facility]) CM/SW Contact:    Lennart Pall, LCSW Phone Number: 08/31/2021, 3:34 PM    Transition of Care Department [facility]) has reviewed patient and no TOC needs have been identified at this time. We will continue to monitor patient advancement through interdisciplinary progression rounds. If new patient transition needs arise, please place a TOC consult.

## 2021-08-31 NOTE — Progress Notes (Signed)
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  He is reportedly scheduled to begin XRT to this area 09/01/21.  Consultants:  Palliative Care  Code Status: FULL CODE  Antimicrobials:  None  DVT prophylaxis: Xarelto  Interim Hx: Afebrile.  Vital signs stable.  Saturation 97% on room air.  Renal function has normalized.  Unfortunately the patient has experienced worsening of pain overnight.  He has required multiple doses of Dilaudid in addition to his MS Contin and MS IR.  The patient and I had a lengthy discussion concerning our goals and titrating his medications.  I explained to him my desire to adjust his MS IR as a guide that will ultimately lead to [name] adjusting his MS Contin in hopes of completely liberating him from the IV narcotic and making it such that he only needs the occasional dose of MS IR.  He has been encouraged to avoid asking for the Dilaudid unless he is having truly unbearable pain and otherwise to attempt to utilize MS IR for acute exacerbations.  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 Initially diagnosed April 2022 w/ small metastatic brain lesion - underwent RUL lobectomy in April w/ chemo in June, and [facility] to initial brain lesion in June - local recurrence at R chest wall noted November 2022 - s/p stereotactic radiosurgery to 4 brain lesions  08/26/2021 -pain medication regimen being actively titrated now in hopes of finding a regimen that will allow him to go home under good control  Acute kidney injury on CKD stage IIIa Baseline creatinine 1.0 - creatinine 1.27 at presentation -renal function has normalized with simple volume resuscitation  Atrial fibrillation Rate controlled - remains on Xarelto  History of PE Continue Xarelto  History of CAD status post CABG x5 Continue statin, aspirin, metoprolol  HLD Continue statin  Bipolar disorder/anxiety/depression Continue usual home medications   Family Communication: No family present at time of exam Disposition: Discharge home once pain well controlled on oral regimen  Objective: Blood pressure 93/63, pulse 79, temperature 98 F (36.7 C), temperature source Oral, resp. rate 17, height 5\' 9"  (1.753 m), weight 75.2 kg, SpO2 97 %.  Intake/Output Summary (Last 24 hours) at 08/31/2021 0839 Last data filed at 08/30/2021 1700 Gross per 24 hour  Intake 170 ml  Output --  Net 170 ml    Filed Weights   08/29/21 2110  Weight: 75.2 kg    Examination: General: No acute respiratory distress Lungs: Clear to auscultation bilaterally -no wheeze Cardiovascular: Regular rate and rhythm without murmur  Abdomen: Nontender, nondistended, soft, bowel sounds positive, no rebound, no ascites Extremities: No significant edema bilateral lower extremities  CBC: Recent Labs  Lab 08/29/21 2135 08/30/21 0430  WBC 9.4 8.3  NEUTROABS 6.2  --   HGB 12.4* 11.5*  HCT 38.3* 35.5*  MCV 92.7 92.7  PLT 267 381    Basic Metabolic Panel: Recent Labs  Lab 08/29/21 2135  08/30/21 0430  NA 137 137  K 4.2 3.9  CL 105 106  CO2 26 26  GLUCOSE 98 98  BUN 23 23  CREATININE 1.27* 1.12  CALCIUM 10.0 10.3    GFR: Estimated Creatinine Clearance: 64.9 mL/min (by C-G formula based on SCr of 1.12 mg/dL).   HbA1C: Hgb A1c MFr Bld  Date/Time Value Ref Range Status  11/10/2016 02:07 AM 5.0  4.8 - 5.6 % Final    Comment:    (NOTE)         Pre-diabetes: 5.7 - 6.4         Diabetes: >6.4         Glycemic control for adults with diabetes: <7.0   09/07/2016 07:42 PM 5.1 4.8 - 5.6 % Final    Comment:    (NOTE)         Pre-diabetes: 5.7 - 6.4         Diabetes: >6.4         Glycemic control for adults with diabetes: <7.0     Recent Results (from the past 240 hour(s))  Culture, blood (Routine X 2) w Reflex to ID Panel     Status: None   Collection Time: 08/21/21  4:39 PM   Specimen: BLOOD  Result Value Ref Range Status   Specimen Description   Final    BLOOD RIGHT ANTECUBITAL Performed at [facility], [place] [address], [place], [place] [address]    Special Requests   Final    BOTTLES DRAWN AEROBIC ONLY Blood Culture adequate volume Performed at [facility] [address], [place], [place] [address]    Culture   Final    NO GROWTH 5 DAYS Performed at [facility], [place] [address], [place], [place] [address]    Report Status 08/26/2021 FINAL  Final  Culture, blood (Routine X 2) w Reflex to ID Panel     Status: None   Collection Time: 08/21/21  4:39 PM   Specimen: BLOOD RIGHT HAND  Result Value Ref Range Status   Specimen Description   Final    BLOOD RIGHT HAND Performed at [facility] [address] [place], [place] [address]    Special Requests   Final    BOTTLES DRAWN AEROBIC ONLY Blood Culture adequate volume Performed at [facility] [address] [place], [place] [address]    Culture   Final    NO GROWTH 5 DAYS Performed at [facility], [place] [address], [place], [place] [address]    Report Status 08/26/2021 FINAL  Final  Resp Panel by RT-PCR (Flu A&B, Covid) Nasopharyngeal Swab     Status: None   Collection Time: 08/29/21  9:12 PM   Specimen: Nasopharyngeal Swab; Nasopharyngeal(NP) swabs in vial transport medium  Result Value Ref Range Status   SARS Coronavirus 2 by RT PCR  NEGATIVE NEGATIVE Final    Comment: (NOTE) SARS-CoV-2 target nucleic acids are NOT DETECTED.  The SARS-CoV-2 RNA is generally detectable in upper respiratory specimens during the acute phase of infection. The lowest concentration of SARS-CoV-2 viral copies this assay can detect is 138 copies/mL. A negative result does not preclude SARS-Cov-2 infection and should not be used as the sole basis for treatment or other patient management decisions. A negative result may occur with  improper specimen collection/handling, submission of specimen other than nasopharyngeal swab, presence of viral mutation(s) within the areas targeted by this assay, and inadequate number of  viral copies(<138 copies/mL). A negative result must be combined with clinical observations, patient history, and epidemiological information. The expected result is Negative.  Fact Sheet for Patients:  EntrepreneurPulse.com.au  Fact Sheet for Healthcare Providers:  IncredibleEmployment.be  This test is no t yet approved or cleared by the [place] FDA and  has been authorized for detection and/or diagnosis of SARS-CoV-2 by FDA under an Emergency Use Authorization (EUA). This EUA will remain  in effect (meaning this test can be used) for the duration of the COVID-19 declaration under Section 564(b)(1) of the Act, 21 U.S.C.section 360bbb-3(b)(1), unless the authorization is terminated  or revoked sooner.       Influenza A by PCR NEGATIVE NEGATIVE Final   Influenza B by PCR NEGATIVE NEGATIVE Final    Comment: (NOTE) The Xpert Xpress SARS-CoV-2/FLU/RSV plus assay is intended as an aid in the diagnosis of influenza from Nasopharyngeal swab specimens and should not be used as a sole basis for treatment. Nasal washings and aspirates are unacceptable for Xpert Xpress SARS-CoV-2/FLU/RSV testing.  Fact Sheet for Patients: EntrepreneurPulse.com.au  Fact Sheet for  Healthcare Providers: IncredibleEmployment.be  This test is not yet approved or cleared by the [place] FDA and has been authorized for detection and/or diagnosis of SARS-CoV-2 by FDA under an Emergency Use Authorization (EUA). This EUA will remain in effect (meaning this test can be used) for the duration of the COVID-19 declaration under Section 564(b)(1) of the Act, 21 U.S.C. section 360bbb-3(b)(1), unless the authorization is terminated or revoked.  Performed at [facility], [place] [address], [place], [place] [address]      Scheduled Meds:  acetaminophen  650 mg Oral TID   aspirin EC  81 mg Oral Daily   atorvastatin  80 mg Oral Daily   DULoxetine  40 mg Oral QPM   gabapentin  100 mg Oral BID AC   gabapentin  200 mg Oral QHS   lidocaine  1 patch Transdermal Q24H   metoprolol succinate  25 mg Oral Daily   midodrine  2.5 mg Oral TID WC   morphine  15 mg Oral Q8H   multivitamin with minerals  1 tablet Oral Daily   ranolazine  1,000 mg Oral BID   rivaroxaban  20 mg Oral QPM   Continuous Infusions:  methocarbamol (ROBAXIN) IV 500 mg (08/30/21 1446)     LOS: 1 day   Cherene Altes, MD [facility] Office  [phone] Pager - Text Page per Shea Evans  If 7PM-7AM, please contact night-coverage per Amion 08/31/2021, 8:39 AM

## 2021-08-31 NOTE — Plan of Care (Signed)
Plan of care reviewed and discussed with the patient. 

## 2021-09-01 ENCOUNTER — Ambulatory Visit: Payer: Medicare Other | Admitting: Internal Medicine

## 2021-09-01 ENCOUNTER — Ambulatory Visit
Admission: RE | Admit: 2021-09-01 | Discharge: 2021-09-01 | Disposition: A | Discharge status: Home / Self Care | Payer: Medicare Other | Source: Ambulatory Visit | Attending: Radiation Oncology | Admitting: Radiation Oncology

## 2021-09-01 ENCOUNTER — Encounter: Payer: Medicare Other | Admitting: Nurse Practitioner

## 2021-09-01 DIAGNOSIS — Z7189 Other specified counseling: Secondary | ICD-10-CM | POA: Diagnosis not present

## 2021-09-01 DIAGNOSIS — R0789 Other chest pain: Secondary | ICD-10-CM | POA: Diagnosis not present

## 2021-09-01 DIAGNOSIS — Z515 Encounter for palliative care: Secondary | ICD-10-CM | POA: Diagnosis not present

## 2021-09-01 DIAGNOSIS — C3411 Malignant neoplasm of upper lobe, right bronchus or lung: Secondary | ICD-10-CM | POA: Diagnosis not present

## 2021-09-01 DIAGNOSIS — C3491 Malignant neoplasm of unspecified part of right bronchus or lung: Secondary | ICD-10-CM | POA: Insufficient documentation

## 2021-09-01 DIAGNOSIS — R911 Solitary pulmonary nodule: Secondary | ICD-10-CM | POA: Insufficient documentation

## 2021-09-01 LAB — GLUCOSE, CAPILLARY: Glucose-Capillary: 105 mg/dL — ABNORMAL HIGH (ref 70–99)

## 2021-09-01 MED ORDER — MORPHINE SULFATE ER 30 MG PO TBCR
30.0000 mg | EXTENDED_RELEASE_TABLET | Freq: Three times a day (TID) | ORAL | Status: DC
  Administered 2021-09-01 – 2021-09-03 (×6): 30 mg via ORAL
  Filled 2021-09-01 (×6): qty 1

## 2021-09-01 MED ORDER — HYDROMORPHONE HCL 1 MG/ML IJ SOLN
1.0000 mg | INTRAMUSCULAR | Status: DC | PRN
  Administered 2021-09-01 (×2): 1 mg via INTRAVENOUS
  Filled 2021-09-01 (×3): qty 1

## 2021-09-01 MED ORDER — GABAPENTIN 300 MG PO CAPS
300.0000 mg | ORAL_CAPSULE | Freq: Every day | ORAL | Status: DC
  Administered 2021-09-01 – 2021-09-02 (×2): 300 mg via ORAL
  Filled 2021-09-01 (×2): qty 1

## 2021-09-01 NOTE — Progress Notes (Signed)
Palliative Medicine Inpatient Follow Up Note     Chart Reviewed. Patient assessed at the bedside.   [name] is sitting up in bed. His wife is at the bedside. Continues to complain of pain but does endorse some relief with IV dilaudid.   I discussed at length with patient and his wife the goal of obtaining an effective pain regimen allowing for discharge.  I discussed at length the importance of only using IV Dilaudid as previously discussed with the patient by attending for severe pain not controlled by oral breakthrough medications.  Siddharth shares on yesterday he took the MS IR and became nauseated and drowsy.  He has been taking MS IR since admission without complications however during this event on yesterday he took 30 mg instead of 15 mg.  Education provided on taking medication with food in addition to if patient continues to have drowsiness decreasing dose back to 15 mg as 30 mg may be too much.  He is inquiring about his home Dilaudid dose.  Education provided on doses conversions which is sufficient and actually an increase from his home regimen.  He and wife verbalized understanding.  Patient has received a total of 11 mg hydromorphone over the past 24 hours.  Based on conversions allow for effective management dosing of MS Contin will be increased to 30 mg every 8 hours.  Patient understands this will take effect at his next dose which is scheduled for 2 PM.  We will continue MS IR 15-30 mg as needed for breakthrough pain.  Again encouraged patient on use of IV Dilaudid to allow a true understanding of his current pain control in preparation for discharge home.  He understands his IV Dilaudid will most likely be discontinued or extended out with strict parameters over the next 24 hours to achieve home regimen goal.  Nausea has been controlled with current regimen.  Patient is scheduled for radiation treatment on today.  During my visit they arrived to take him over to the cancer  center for his scheduled treatment.  Wife is at the bedside and accommodating patient to his appointment.  Discussed the importance of continued conversation with family and their  medical providers regarding overall plan of care and treatment options, ensuring decisions are within the context of the patients values and GOCs.   Questions addressed and support provided.  Wife and patient expressed no further questions at this time.  Objective Assessment: Vital Signs Vitals:   08/31/21 2035 09/01/21 0455  BP: (!) 91/58 (!) 93/54  Pulse: 80 72  Resp: 15 18  Temp: 97.9 F (36.6 C) 98 F (36.7 C)  SpO2: 96% 96%    Intake/Output Summary (Last 24 hours) at 09/01/2021 7824 Last data filed at 09/01/2021 0600 Gross per 24 hour  Intake 600 ml  Output --  Net 600 ml   Last Weight  Most recent update: 08/29/2021  9:10 PM    Weight  75.2 kg (165 lb 12.6 oz)            Gen:  NAD HEENT: moist mucous membranes CV: Regular rate and rhythm PULM: Normal breathing pattern EXT: No edema Neuro: Alert and oriented x3  SUMMARY OF RECOMMENDATIONS   Extensive discussion with patient and wife expressing goal of obtaining effective pain regimen in preparation of discharging home and close follow-up at the cancer center.  He understands purpose of IV Dilaudid and that this will most likely be discontinued/extended timeframe over the next 24 hours to allow  for oral medication tolerance.  Education provided that we will need to make sure current oral regimen is effective without IV medications as we do not want to send him home and he have to return due to pain given false perception of pain control with IV meds. Increase MS Contin to 30 mg every 8 hours (patient has received 11 mg IV Dilaudid over past 24 hours.  MS Contin conversion completed warranting dose increase) Gabapentin 300 mg nightly, gabapentin 100 mg twice daily Compazine 10 mg as needed for nausea I will plan to closely follow patient at  [facility] s/p discharge for symptom management/goals of care. PMT will continue to support and follow on as needed basis. Please secure chat for urgent needs.   Time Total: 40 min.   Visit consisted of counseling and education dealing with the complex and emotionally intense issues of symptom management and palliative care in the setting of serious and potentially life-threatening illness.Greater than 50%  of this time was spent counseling and coordinating care related to the above assessment and plan.  Alda Lea, AGPCNP-BC  Palliative Medicine Team [phone]  Palliative Medicine Team providers are available by phone from 7am to 7pm daily and can be reached through the team cell phone. Should this patient require assistance outside of these hours, please call the patient's attending physician.

## 2021-09-01 NOTE — Progress Notes (Signed)
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  He is reportedly scheduled to begin XRT to this area 09/01/21.  Consultants:  Palliative Care  Code Status: FULL CODE  Antimicrobials:  None  DVT prophylaxis: Xarelto  Interim Hx: Afebrile.  Saturation 98% on room air.  Vital signs stable with blood pressure at his baseline systolic of mid 61P.  No new labs today.  Reports that his pain is still poorly controlled with his current regimen.  Has been experiencing some episodes of lightheadedness/dizziness shortly after doses of MS IR.  Reports pain of 7 out of 10 at times for which he asked for IV pain medications.  The patient and I spoke again at length about his narcotic regimen.  I explained the difference between long-acting oral medications, short acting oral medications, and IV medications.  I again explained our intention to titrate his oral medications to the point that he can control his pain well enough to go home.  I have encouraged him to attempt to use only oral medications while he is here to simulate home experience to see if his current dose is appropriate.  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 Initially diagnosed April 2022 w/ small metastatic brain lesion - underwent RUL lobectomy in April w/ chemo in June, and [facility] to initial brain lesion in June -  local recurrence at R chest wall noted November 2022 - s/p stereotactic radiosurgery to 4 brain lesions 08/26/2021 -pain medication regimen being actively titrated now in hopes of finding a regimen that will allow him to go home under good control - based on his total IV Dilaudid use over the last 24 hours his MS Contin dose has been adjusted today  Acute kidney injury on CKD stage IIIa Baseline creatinine 1.0 - creatinine 1.27 at presentation - renal function has normalized with simple volume resuscitation  Atrial fibrillation Rate controlled - remains on Xarelto  History of PE Continue Xarelto  History of CAD status post CABG x5 Continue statin, aspirin, metoprolol  HLD Continue statin  Bipolar disorder/anxiety/depression Continue usual home medications   Family Communication: Spoke with wife at bedside Disposition: Discharge home once pain well controlled on oral regimen  Objective: Blood pressure 95/61, pulse 75, temperature 98 F (36.7 C), temperature source Oral, resp. rate 16, height 5\' 9"  (1.753 m), weight 75.2 kg, SpO2 98 %.  Intake/Output Summary (Last 24 hours) at 09/01/2021 1305 Last data filed at 09/01/2021 1100 Gross per 24 hour  Intake 820 ml  Output --  Net 820 ml    Filed Weights   08/29/21 2110  Weight: 75.2 kg    Examination: General: No acute respiratory distress Cardiovascular: Regular rate  Abdomen: Benign Extremities: No significant edema B LE  CBC: Recent Labs  Lab 08/29/21 2135 08/30/21 0430  WBC 9.4 8.3  NEUTROABS 6.2  --   HGB 12.4* 11.5*  HCT 38.3* 35.5*  MCV 92.7 92.7  PLT 267 250    Basic Metabolic Panel: Recent Labs  Lab 08/29/21 2135 08/30/21 0430  NA 137 137  K 4.2 3.9  CL 105 106  CO2 26 26  GLUCOSE 98 98  BUN 23 23  CREATININE 1.27* 1.12  CALCIUM 10.0 10.3    GFR: Estimated Creatinine Clearance: 64.9 mL/min (by C-G formula based on SCr of 1.12 mg/dL).   HbA1C: Hgb A1c MFr Bld  Date/Time Value Ref Range  Status  11/10/2016 02:07 AM 5.0 4.8 - 5.6 % Final    Comment:    (NOTE)         Pre-diabetes: 5.7 - 6.4         Diabetes: >6.4         Glycemic control for adults with diabetes: <7.0   09/07/2016 07:42 PM 5.1 4.8 - 5.6 % Final    Comment:    (NOTE)         Pre-diabetes: 5.7 - 6.4         Diabetes: >6.4         Glycemic control for adults with diabetes: <7.0     Scheduled Meds:  acetaminophen  650 mg Oral TID   aspirin EC  81 mg Oral Daily   atorvastatin  80 mg Oral Daily   DULoxetine  40 mg Oral QPM   gabapentin  100 mg Oral BID AC   gabapentin  300 mg Oral QHS   lidocaine  1 patch Transdermal Q24H   midodrine  2.5 mg Oral TID WC   morphine  30 mg Oral Q8H   multivitamin with minerals  1 tablet Oral Daily   polyethylene glycol  17 g Oral Daily   ranolazine  1,000 mg Oral BID   rivaroxaban  20 mg Oral QPM   senna-docusate  1 tablet Oral BID   Continuous Infusions:  methocarbamol (ROBAXIN) IV 500 mg (08/30/21 1446)     LOS: 2 days   Cherene Altes, MD [facility] Office  [phone] Pager - Text Page per Amion  If 7PM-7AM, please contact night-coverage per Amion 09/01/2021, 1:05 PM

## 2021-09-02 ENCOUNTER — Other Ambulatory Visit: Payer: Self-pay | Admitting: Nurse Practitioner

## 2021-09-02 ENCOUNTER — Other Ambulatory Visit (HOSPITAL_COMMUNITY): Payer: Self-pay

## 2021-09-02 ENCOUNTER — Ambulatory Visit
Admit: 2021-09-02 | Discharge: 2021-09-02 | Disposition: A | Discharge status: Home / Self Care | Payer: Medicare Other | Attending: Radiation Oncology | Admitting: Radiation Oncology

## 2021-09-02 DIAGNOSIS — I4891 Unspecified atrial fibrillation: Secondary | ICD-10-CM

## 2021-09-02 DIAGNOSIS — Z7189 Other specified counseling: Secondary | ICD-10-CM | POA: Diagnosis not present

## 2021-09-02 DIAGNOSIS — R0789 Other chest pain: Secondary | ICD-10-CM | POA: Diagnosis not present

## 2021-09-02 DIAGNOSIS — C3491 Malignant neoplasm of unspecified part of right bronchus or lung: Secondary | ICD-10-CM | POA: Diagnosis not present

## 2021-09-02 DIAGNOSIS — I959 Hypotension, unspecified: Secondary | ICD-10-CM | POA: Diagnosis not present

## 2021-09-02 DIAGNOSIS — Z515 Encounter for palliative care: Secondary | ICD-10-CM | POA: Diagnosis not present

## 2021-09-02 DIAGNOSIS — N179 Acute kidney failure, unspecified: Secondary | ICD-10-CM

## 2021-09-02 MED ORDER — MORPHINE SULFATE 15 MG PO TABS: ORAL_TABLET | ORAL | 0 refills | Status: DC

## 2021-09-02 MED ORDER — MORPHINE SULFATE 15 MG PO TABS
ORAL_TABLET | ORAL | 0 refills | Status: DC
Start: 2021-09-02 — End: 2021-09-09
  Filled 2021-09-02: qty 90, 7d supply, fill #0

## 2021-09-02 MED ORDER — MORPHINE SULFATE ER 30 MG PO TBCR: 30.0000 mg | EXTENDED_RELEASE_TABLET | Freq: Three times a day (TID) | ORAL | 0 refills | Status: DC

## 2021-09-02 MED ORDER — HYDROMORPHONE HCL 1 MG/ML IJ SOLN
0.5000 mg | INTRAMUSCULAR | Status: DC | PRN
Start: 2021-09-02 — End: 2021-09-03
  Administered 2021-09-02: 0.5 mg via INTRAVENOUS
  Filled 2021-09-02: qty 0.5

## 2021-09-02 MED ORDER — RIVAROXABAN 10 MG PO TABS
20.0000 mg | ORAL_TABLET | Freq: Every day | ORAL | Status: DC
  Administered 2021-09-02: 20 mg via ORAL
  Filled 2021-09-02: qty 2

## 2021-09-02 MED ORDER — MORPHINE SULFATE ER 30 MG PO TBCR
30.0000 mg | EXTENDED_RELEASE_TABLET | Freq: Three times a day (TID) | ORAL | 0 refills | Status: DC
  Filled 2021-09-02: qty 90, 30d supply, fill #0

## 2021-09-02 NOTE — Progress Notes (Signed)
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  He is reportedly scheduled to begin XRT to this area 09/01/21.   Assessment & Plan:   Principal Problem:   Right-sided chest wall pain Active Problems:   Lung cancer (HCC)   Intractable right sided chest wall pain In setting of known metastatic cancer. Palliative care consulted and analgesic regimen adjusted. Patient has achieved adequate pain control using MS Contin 30 mg BID in addition to MSIR 15-30 mg prn. Discussed with palliative care and awaiting authorization so patient can discharge with access to pain medications.  AKI on CKD stage IIIa Previously diagnosed CKD. AKI was mild on admission and resolved with IV fluids.  Atrial fibrillation Rate controlled. Continue Xarelto  History of PE Continue Xarelto  CAD History of CABG Continue Lipitor, aspirin  Hypotension Chronic. Continue midodrine   DVT prophylaxis: Xarelto Code Status:   Code Status: Full Code Family Communication: None at bedside Disposition Plan: Discharge home in AM   Consultants:  Palliative care medicine  Procedures:  None  Antimicrobials: None    Subjective: Pain is better controlled.  Objective: Vitals:   09/01/21 1348 09/01/21 2050 09/02/21 0606 09/02/21 1352  BP: 116/65 100/60 107/64 (!) 90/59  Pulse: 86 75 74 71  Resp: 18 15 14 17   Temp: 98.9 F (37.2 C) 97.7 F (36.5 C) 97.8 F (36.6 C) 98 F (36.7 C)   TempSrc: Oral Oral Oral Oral  SpO2: 97% 96% 98% 95%  Weight:      Height:        Intake/Output Summary (Last 24 hours) at 09/02/2021 1849 Last data filed at 09/02/2021 1300 Gross per 24 hour  Intake 900 ml  Output --  Net 900 ml   Filed Weights   08/29/21 2110  Weight: 75.2 kg    Examination:  General exam: Appears calm and comfortable  Respiratory system: Clear to auscultation. Respiratory effort normal. Cardiovascular system: S1 & S2 heard, normal rate Gastrointestinal system: Abdomen is nondistended, soft and nontender. No organomegaly or masses felt. Normal bowel sounds heard. Central nervous system: Alert and oriented. No focal neurological deficits. Musculoskeletal: No edema. No calf tenderness Skin: No cyanosis. No rashes Psychiatry: Judgement and insight appear normal. Mood & affect appropriate.     Data Reviewed: I have personally reviewed following labs and imaging studies  CBC Lab Results  Component Value Date   WBC 8.3 08/30/2021   RBC 3.83 (L) 08/30/2021   HGB 11.5 (L) 08/30/2021   HCT 35.5 (L) 08/30/2021   MCV 92.7 08/30/2021   MCH 30.0 08/30/2021   PLT 253 08/30/2021   MCHC 32.4 08/30/2021   RDW 12.8 08/30/2021   LYMPHSABS 2.1 08/29/2021   MONOABS 0.8 08/29/2021   EOSABS 0.3 08/29/2021   BASOSABS 0.0 85/27/7824     Last metabolic panel Lab Results  Component Value Date   NA 137 08/30/2021   K 3.9 08/30/2021   CL 106 08/30/2021   CO2 26 08/30/2021   BUN 23 08/30/2021  CREATININE 1.12 08/30/2021   GLUCOSE 98 08/30/2021   GFRNONAA >60 08/30/2021   GFRAA >60 05/27/2020   CALCIUM 10.3 08/30/2021   PHOS 3.8 08/21/2021   PROT 6.8 08/19/2021   ALBUMIN 3.7 08/19/2021   BILITOT 0.7 08/19/2021   ALKPHOS 67 08/19/2021   AST 17 08/19/2021   ALT 18 08/19/2021   ANIONGAP 5 08/30/2021    CBG (last 3)  Recent Labs    08/31/21 1339  GLUCAP 105*     GFR: Estimated Creatinine Clearance: 64.9 mL/min (by C-G formula based on SCr of 1.12  mg/dL).  Coagulation Profile: No results for input(s): INR, PROTIME in the last 168 hours.  Recent Results (from the past 240 hour(s))  Resp Panel by RT-PCR (Flu A&B, Covid) Nasopharyngeal Swab     Status: None   Collection Time: 08/29/21  9:12 PM   Specimen: Nasopharyngeal Swab; Nasopharyngeal(NP) swabs in vial transport medium  Result Value Ref Range Status   SARS Coronavirus 2 by RT PCR NEGATIVE NEGATIVE Final    Comment: (NOTE) SARS-CoV-2 target nucleic acids are NOT DETECTED.  The SARS-CoV-2 RNA is generally detectable in upper respiratory specimens during the acute phase of infection. The lowest concentration of SARS-CoV-2 viral copies this assay can detect is 138 copies/mL. A negative result does not preclude SARS-Cov-2 infection and should not be used as the sole basis for treatment or other patient management decisions. A negative result may occur with  improper specimen collection/handling, submission of specimen other than nasopharyngeal swab, presence of viral mutation(s) within the areas targeted by this assay, and inadequate number of viral copies(<138 copies/mL). A negative result must be combined with clinical observations, patient history, and epidemiological information. The expected result is Negative.  Fact Sheet for Patients:  EntrepreneurPulse.com.au  Fact Sheet for Healthcare Providers:  IncredibleEmployment.be  This test is no t yet approved or cleared by the [place] FDA and  has been authorized for detection and/or diagnosis of SARS-CoV-2 by FDA under an Emergency Use Authorization (EUA). This EUA will remain  in effect (meaning this test can be used) for the duration of the COVID-19 declaration under Section 564(b)(1) of the Act, 21 U.S.C.section 360bbb-3(b)(1), unless the authorization is terminated  or revoked sooner.       Influenza A by PCR NEGATIVE NEGATIVE Final   Influenza B by PCR NEGATIVE NEGATIVE  Final    Comment: (NOTE) The Xpert Xpress SARS-CoV-2/FLU/RSV plus assay is intended as an aid in the diagnosis of influenza from Nasopharyngeal swab specimens and should not be used as a sole basis for treatment. Nasal washings and aspirates are unacceptable for Xpert Xpress SARS-CoV-2/FLU/RSV testing.  Fact Sheet for Patients: EntrepreneurPulse.com.au  Fact Sheet for Healthcare Providers: IncredibleEmployment.be  This test is not yet approved or cleared by the [place] FDA and has been authorized for detection and/or diagnosis of SARS-CoV-2 by FDA under an Emergency Use Authorization (EUA). This EUA will remain in effect (meaning this test can be used) for the duration of the COVID-19 declaration under Section 564(b)(1) of the Act, 21 U.S.C. section 360bbb-3(b)(1), unless the authorization is terminated or revoked.  Performed at [facility], [place] [address] [place], [place] [address]         Radiology Studies: No results found.      Scheduled Meds:  acetaminophen  650 mg Oral TID   aspirin EC  81 mg Oral Daily   atorvastatin  80 mg Oral Daily   DULoxetine  40 mg Oral QPM  gabapentin  100 mg Oral BID AC   gabapentin  300 mg Oral QHS   lidocaine  1 patch Transdermal Q24H   midodrine  2.5 mg Oral TID WC   morphine  30 mg Oral Q8H   multivitamin with minerals  1 tablet Oral Daily   polyethylene glycol  17 g Oral Daily   ranolazine  1,000 mg Oral BID   rivaroxaban  20 mg Oral Q supper   senna-docusate  1 tablet Oral BID   Continuous Infusions:  methocarbamol (ROBAXIN) IV 500 mg (08/30/21 1446)     LOS: 3 days     Cordelia Poche, MD [facility] 09/02/2021, 6:49 PM  If 7PM-7AM, please contact night-coverage [email or web address]

## 2021-09-02 NOTE — Progress Notes (Signed)
° ° °  Chart reviewed. Patient assessed at bedside. No acute distress noted. No family at bedside.   [name] is awake and alert. Appears comfortable. Shares he is tolerating current regimen of MSIR and MS Contin ER. Feels this is managing his pain and hopeful this will continue. He has only used IV Dilaudid 3 times over past 24 hours. This is encouraging as we move closer to discharge.   We discussed continuing current plan at discharge. Education provided on regimen with expressed understanding he will discontinue oral Dilaudid when he returns home and will continue on MSIR. No adjustments needed at this time as pain seems well controlled. Will continue to monitor for any acute changes.   [name] is aware we will continue to closely monitor and evaluate his pain control outpatient at [facility]. We will plan to see him the following week after discharge. Appointment will be scheduled in collaboration to his other upcoming appointments. He has been receiving previously scheduled radiation treatments during hospitalization.   All questions answered and support provided.   Assessment -NAD -Normal breathing pattern -AAO x3  Plan  -Continue current regimen at discharge.   -MS Contin ER 30 mg every 8 hours -MS IR 15-30 mg every 4 hours as needed for breakthrough pain  -Compazine 10 mg every 6 hours as needed for nausea -Miralax daily in the setting of opioid use  -Senna twice daily for bowel regimen -Neurontin 300 mg QHS and 100mg  BID -I have sent prescriptions to [facility] for pick-up at discharge. Prescription readiness has been confirmed and available 1/5 morning. His local pharmacy in [place] did not have accurate supply and to prevent delays in treatment with readmission this was sent to our pharmacy to ensure availability. Dr. Lonny Prude aware.  -PMT will continue to support and follow as needed. Please secure chat with urgent needs. -I will plan to follow-up for symptom management  at [facility] on 09/10/2021 @ 11am.    Time Total: 40 min.   Visit consisted of counseling and education dealing with the complex and emotionally intense issues of symptom management and palliative care in the setting of serious and potentially life-threatening illness.Greater than 50%  of this time was spent counseling and coordinating care related to the above assessment and plan.  Alda Lea, AGPCNP-BC  [place]

## 2021-09-03 ENCOUNTER — Ambulatory Visit
Admit: 2021-09-03 | Discharge: 2021-09-03 | Disposition: A | Discharge status: Home / Self Care | Payer: Medicare Other | Attending: Radiation Oncology | Admitting: Radiation Oncology

## 2021-09-03 ENCOUNTER — Other Ambulatory Visit (HOSPITAL_COMMUNITY): Payer: Self-pay

## 2021-09-03 DIAGNOSIS — R0789 Other chest pain: Secondary | ICD-10-CM | POA: Diagnosis not present

## 2021-09-03 MED ORDER — POLYETHYLENE GLYCOL 3350 17 G PO PACK: 17.0000 g | PACK | Freq: Every day | ORAL | 0 refills | Status: DC

## 2021-09-03 NOTE — Plan of Care (Signed)
  Problem: Coping: Goal: Level of anxiety will decrease Outcome: Progressing   Problem: Pain Managment: Goal: General experience of comfort will improve Outcome: Progressing   

## 2021-09-03 NOTE — Plan of Care (Signed)
  Problem: Pain Managment: Goal: General experience of comfort will improve Outcome: Progressing   Problem: Coping: Goal: Level of anxiety will decrease Outcome: Progressing   

## 2021-09-03 NOTE — Discharge Summary (Signed)
Physician Discharge Summary  [name] IFO:[id number] DOB: 06-20-1956 DOA: 08/29/2021  PCP: [name], MD  Admit date: 08/29/2021 Discharge date: 09/03/2021  Admitted From: Home Disposition: Home  Recommendations for Outpatient Follow-up:  Follow up with PCP in 1 week Please follow up on the following pending results: None  Home Health: None Equipment/Devices: None  Discharge Condition: Stable CODE STATUS: Full code Diet recommendation: Heart healthy   Brief/Interim Summary:  Admission HPI written by Barton Dubois, MD   HPI: [name] is a 66 y.o. male with medical history significant of chronic pain syndrome, fibromyalgia, ectopic atrial tachycardia, anxiety/depression, hx of CAD s/p CABG/  H hyperlipidemia, hypertension, history of pulmonary embolism.  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  Patient expressed known hx of right-sided chest wall metastatic lesion and ongoing pain. Home meds at home not alleviating his symptoms.  Patient has reported pending radiotherapy initiation.  Patient denies fever, nausea, vomiting, dysuria, focal weakness or any other complaints.   Hospital course:  Intractable right sided chest wall pain In setting of known metastatic cancer. Palliative care consulted and analgesic regimen adjusted. Patient has achieved adequate pain control using MS Contin 30 mg BID in addition to MSIR 15-30 mg prn. Palliative care provided outpatient narcotic prescription.   AKI on CKD stage IIIa Previously diagnosed CKD. AKI was mild on admission and resolved with IV fluids.   Atrial fibrillation Rate controlled. Continue Xarelto   History of PE Continue Xarelto   CAD History of CABG Continue Lipitor, aspirin   Hypotension Chronic. Continue midodrine    Discharge  Instructions   Allergies as of 09/03/2021       Reactions   Indomethacin Other (See Comments)   rectal bleeding   Prednisone Other (See Comments)   States that this med makes him "crazy"   Tetanus Toxoids Swelling, Other (See Comments)   Fever, Swelling of the arm    Wellbutrin [bupropion] Other (See Comments)   Crazy thoughts, nightmares   Gabapentin    Other reaction(s): Unknown   Varenicline Other (See Comments)   Dreams Other reaction(s): Unknown        Medication List     TAKE these medications    acetaminophen 500 MG tablet Commonly known as: TYLENOL Take 1,000 mg by mouth every 6 (six) hours as needed for mild pain, fever or headache. What changed: Another medication with the same name was removed. Continue taking this medication, and follow the directions you see here.   aspirin EC 81 MG tablet Take 81 mg by mouth daily. Swallow whole.   atorvastatin 80 MG tablet Commonly known as: LIPITOR TAKE 1 TABLET(80 MG) BY MOUTH DAILY What changed: See the new instructions.   docusate sodium 100 MG capsule Commonly known as: COLACE Take 100-200 mg by mouth daily.   DULoxetine HCl 40 MG Cpep Take 40 mg by mouth every evening.   gabapentin 100 MG capsule Commonly known as: NEURONTIN Take 1 capsule (100 mg total) by mouth at bedtime. Take 2 capsules (200 mg total) by mouth at bedtime. Take 1 capsule (100 mg) by mouth twice a day in addition to night dose. What changed:  how much to take when to take this additional instructions   lidocaine 5 % Commonly known as: Lidoderm Place 1 patch onto the skin daily. Remove & Discard patch within 12 hours or  as directed by MD What changed:  when to take this reasons to take this   metoprolol succinate 25 MG 24 hr tablet Commonly known as: TOPROL-XL Take 1 tablet (25 mg total) by mouth daily. What changed: when to take this   midodrine 2.5 MG tablet Commonly known as: PROAMATINE Take 1 tablet (2.5 mg total) by mouth  3 (three) times daily with meals. What changed:  when to take this reasons to take this   morphine 15 MG tablet Commonly known as: MSIR Take 1-2 tablets every 4 hours as needed for moderate to severe pain with food.   morphine 30 MG 12 hr tablet Commonly known as: MS CONTIN Take 1 tablet by mouth every 8 hours.   multivitamin with minerals Tabs tablet Take 1 tablet by mouth daily.   ondansetron 8 MG tablet Commonly known as: ZOFRAN Take 1 tablet (8 mg total) by mouth every 8 (eight) hours as needed for nausea or vomiting. Starting 3 days after chemotherapy   polyethylene glycol 17 g packet Commonly known as: MIRALAX / GLYCOLAX Take 17 g by mouth daily. Start taking on: September 04, 2021   ranolazine 1000 MG SR tablet Commonly known as: RANEXA Take 1 tablet (1,000 mg total) by mouth 2 (two) times daily.   Xarelto 20 MG Tabs tablet Generic drug: rivaroxaban Take 20 mg by mouth every evening.        Follow-up Information     [name], MD. Schedule an appointment as soon as possible for a visit in 1 week(s).   Specialty: Family Medicine Why: For hospital follow-up Contact information: [place] [address] [phone]                Allergies  Allergen Reactions   Indomethacin Other (See Comments)    rectal bleeding   Prednisone Other (See Comments)    States that this med makes him "crazy"   Tetanus Toxoids Swelling and Other (See Comments)    Fever, Swelling of the arm    Wellbutrin [Bupropion] Other (See Comments)    Crazy thoughts, nightmares   Gabapentin     Other reaction(s): Unknown   Varenicline Other (See Comments)    Dreams  Other reaction(s): Unknown    Consultations: Palliative care medicine   Procedures/Studies: CT Angio Chest PE W/Cm &/Or Wo Cm  Result Date: 08/18/2021 CLINICAL DATA:  Pulmonary embolism (PE) suspected, positive D-dimer. Today while going up and down stairs in his house he developed  dyspnea each time. This is new. He has a pulse ox at home and he checked and his heart rate was in the 170s and his oxygen was in the 80s. EXAM: CT ANGIOGRAPHY CHEST WITH CONTRAST TECHNIQUE: Multidetector CT imaging of the chest was performed using the standard protocol during bolus administration of intravenous contrast. Multiplanar CT image reconstructions and MIPs were obtained to evaluate the vascular anatomy. CONTRAST:  14mL OMNIPAQUE IOHEXOL 350 MG/ML SOLN COMPARISON:  None. FINDINGS: Cardiovascular: Satisfactory opacification of the pulmonary arteries to the segmental level. Limited evaluation of the subsegmental level due to motion artifact. No evidence of pulmonary embolism. The main pulmonary artery is normal in caliber. Normal heart size. No significant pericardial effusion. The thoracic aorta is normal in caliber. At least mild atherosclerotic plaque of the thoracic aorta. Four-vessel coronary artery calcifications status post coronary artery bypass graft. Mediastinum/Nodes: No enlarged mediastinal, hilar, or axillary lymph nodes. Thyroid gland, trachea, and esophagus demonstrate no significant findings. Lungs/Pleura: Interval development  of peribronchovascular ground-glass airspace opacities within the left lower lobe. Interval decrease in persistent right lower lobe peribronchovascular ground-glass airspace opacities. Interval increase in site of a left upper lobe 1.1 x 0.8 cm pulmonary nodule (from 0.6 cm). No pulmonary mass. Trace right pleural effusion. No left pleural effusion. No pneumothorax. Upper Abdomen: Splenule noted. Partially visualized fluid density lesion within the left kidney. No acute abnormality. Musculoskeletal: Interval development of soft tissue densities within the right chest wall measuring 1 x 0.8 cm and 1.6 x 1.7 cm (5: 246-252). Interval increase in size of lytic lesions along the posterior cortex of the right eleventh rib (3:875-643) with a nondisplaced pathologic fracture  through the rib not excluded (5:255). Interval development of a couple of lytic lesion within the right ninth rib: Along the anterior cortex (5:191) in the posterior cortex through the anterior cortex (5:225). Old healed right eighth rib fracture anterolaterally. Multilevel degenerative changes of the spine. Anterior and interbody C7-T1 fusion with surgical hardware. Review of the MIP images confirms the above findings. IMPRESSION: 1. Interval increase in size and interval development of a new right rib lytic lesions concerning for osseous metastases. A pathologic nondisplaced fracture of the right eleventh rib is not excluded. 2. Interval development of soft tissue densities within the right chest wall measuring 1 x 0.8 cm and 1.6 x 1.7 cm. These are concerning for metastases given right chest wall hypermetabolic activity on PET CT 07/03/2021. 3. Interval increase in site of a left upper lobe 1.1 x 0.8 cm pulmonary nodule (from 0.6 cm). 4. Interval development of peribronchovascular ground-glass airspace opacities within the left lower lobe. Interval decrease in persistent right lower lobe peribronchovascular ground-glass airspace opacities. Findings suggestive of infection/inflammation. Underlying malignancy not excluded. 5. Trace right pleural effusion. Electronically Signed   By: Iven Finn M.D.   On: 08/18/2021 20:02   MR Brain W Wo Contrast  Result Date: 08/15/2021 CLINICAL DATA:  Follow-up S RS treated brain mass. Metastatic lung cancer. EXAM: MRI HEAD WITHOUT AND WITH CONTRAST TECHNIQUE: Multiplanar, multiecho pulse sequences of the brain and surrounding structures were obtained without and with intravenous contrast. CONTRAST:  14mL MULTIHANCE GADOBENATE DIMEGLUMINE 529 MG/ML IV SOLN COMPARISON:  05/15/2021.  02/04/2021. FINDINGS: Brain: Diffusion imaging does not show any acute or subacute infarction or other cause of restricted diffusion. No abnormality affects the brainstem. Focus of hemosiderin  deposition with punctate enhancement in the left cerebellum is unchanged since the prior study, consistent with an appearance suggesting successful treatment of the metastasis seen in June of this year. Punctate metastasis previously seen at the left frontoparietal vertex is no longer visible, as on the immediate prior exam. 7 mm in diameter new enhancing metastasis at left frontal vertex, axial image 132. 5 mm in diameter new enhancing metastasis in the left parietal lobe axial image 120. 6 mm in diameter new enhancing metastasis at the right posterior frontal vertex axial image 133. 3-4 mm in diameter new enhancing metastasis along the surface of the brain in the deep insula on the right axial image 72. No hydrocephalus or extra-axial collection. Vascular: Major vessels at the base of the brain show flow. Skull and upper cervical spine: Negative Sinuses/Orbits: Small chronic cyst at the superolateral corner of the left maxillary sinus as seen previously orbits negative. Other: None IMPRESSION: 4 new metastases identified as enumerated above, in the left frontal vertex, left parietal lobe, right posterior frontal vertex and right deep insula. Continued good appearance of the previously treated left cerebellar lesion.  No visible residua with respect to a punctate infarction previously seen at the left frontoparietal vertex. Electronically Signed   By: Nelson Chimes M.D.   On: 08/15/2021 16:03   DG Chest Port 1 View  Result Date: 08/29/2021 CLINICAL DATA:  Chest pain. EXAM: PORTABLE CHEST 1 VIEW COMPARISON:  Chest x-ray 08/18/2021. FINDINGS: Cardiomediastinal silhouette is within normal limits. Patient is status post cardiac surgery. Right chest port catheter tip projects over the distal SVC. There is no focal lung infiltrate, pleural effusion or pneumothorax. Cervical spinal fusion plate is present. No acute fractures are seen. IMPRESSION: No acute cardiopulmonary process. Electronically Signed   By: Ronney Asters M.D.   On: 08/29/2021 22:06   DG Chest Port 1 View  Result Date: 08/18/2021 CLINICAL DATA:  Sepsis EXAM: PORTABLE CHEST 1 VIEW COMPARISON:  Chest x-ray 07/29/2021 FINDINGS: Heart size is normal. Mediastinum appears stable. Calcified plaques in the aortic arch. Cardiac surgical changes and median sternotomy wires. Right-sided central venous port with the tip near the cavoatrial junction. Elevated right hemidiaphragm with chronic stable blunting of the right costophrenic angle. No new consolidation identified. No pleural effusion or pneumothorax visualized. IMPRESSION: Stable chronic changes with no acute process identified. Electronically Signed   By: Ofilia Neas M.D.   On: 08/18/2021 13:13     Subjective: No issues overnight.  Discharge Exam: Vitals:   09/02/21 1352 09/02/21 2206  BP: (!) 90/59 100/66  Pulse: 71 75  Resp: 17 18  Temp: 98 F (36.7 C) 98.4 F (36.9 C)  SpO2: 95% 97%   Vitals:   09/01/21 2050 09/02/21 0606 09/02/21 1352 09/02/21 2206  BP: 100/60 107/64 (!) 90/59 100/66  Pulse: 75 74 71 75  Resp: 15 14 17 18   Temp: 97.7 F (36.5 C) 97.8 F (36.6 C) 98 F (36.7 C) 98.4 F (36.9 C)  TempSrc: Oral Oral Oral   SpO2: 96% 98% 95% 97%  Weight:      Height:        General: Pt is alert, awake, not in acute distress Cardiovascular: RRR, S1/S2 +, no rubs, no gallops Respiratory: CTA bilaterally, no wheezing, no rhonchi Abdominal: Soft, NT, ND, bowel sounds + Extremities: no edema, no cyanosis    The results of significant diagnostics from this hospitalization (including imaging, microbiology, ancillary and laboratory) are listed below for reference.     Microbiology: Recent Results (from the past 240 hour(s))  Resp Panel by RT-PCR (Flu A&B, Covid) Nasopharyngeal Swab     Status: None   Collection Time: 08/29/21  9:12 PM   Specimen: Nasopharyngeal Swab; Nasopharyngeal(NP) swabs in vial transport medium  Result Value Ref Range Status   SARS  Coronavirus 2 by RT PCR NEGATIVE NEGATIVE Final    Comment: (NOTE) SARS-CoV-2 target nucleic acids are NOT DETECTED.  The SARS-CoV-2 RNA is generally detectable in upper respiratory specimens during the acute phase of infection. The lowest concentration of SARS-CoV-2 viral copies this assay can detect is 138 copies/mL. A negative result does not preclude SARS-Cov-2 infection and should not be used as the sole basis for treatment or other patient management decisions. A negative result may occur with  improper specimen collection/handling, submission of specimen other than nasopharyngeal swab, presence of viral mutation(s) within the areas targeted by this assay, and inadequate number of viral copies(<138 copies/mL). A negative result must be combined with clinical observations, patient history, and epidemiological information. The expected result is Negative.  Fact Sheet for Patients:  EntrepreneurPulse.com.au  Fact Sheet for Healthcare  Providers:  IncredibleEmployment.be  This test is no t yet approved or cleared by the [place] and  has been authorized for detection and/or diagnosis of SARS-CoV-2 by FDA under an Emergency Use Authorization (EUA). This EUA will remain  in effect (meaning this test can be used) for the duration of the COVID-19 declaration under Section 564(b)(1) of the Act, 21 U.S.C.section 360bbb-3(b)(1), unless the authorization is terminated  or revoked sooner.       Influenza A by PCR NEGATIVE NEGATIVE Final   Influenza B by PCR NEGATIVE NEGATIVE Final    Comment: (NOTE) The Xpert Xpress SARS-CoV-2/FLU/RSV plus assay is intended as an aid in the diagnosis of influenza from Nasopharyngeal swab specimens and should not be used as a sole basis for treatment. Nasal washings and aspirates are unacceptable for Xpert Xpress SARS-CoV-2/FLU/RSV testing.  Fact Sheet for  Patients: EntrepreneurPulse.com.au  Fact Sheet for Healthcare Providers: IncredibleEmployment.be  This test is not yet approved or cleared by the [place] FDA and has been authorized for detection and/or diagnosis of SARS-CoV-2 by FDA under an Emergency Use Authorization (EUA). This EUA will remain in effect (meaning this test can be used) for the duration of the COVID-19 declaration under Section 564(b)(1) of the Act, 21 U.S.C. section 360bbb-3(b)(1), unless the authorization is terminated or revoked.  Performed at [facility], [place] [address], [place], [place] [address]      Labs: BNP (last 3 results) Recent Labs    04/28/21 1751 06/04/21 0835 06/06/21 1736  BNP 95.2 53.0 82.9   Basic Metabolic Panel: Recent Labs  Lab 08/29/21 2135 08/30/21 0430  NA 137 137  K 4.2 3.9  CL 105 106  CO2 26 26  GLUCOSE 98 98  BUN 23 23  CREATININE 1.27* 1.12  CALCIUM 10.0 10.3   Liver Function Tests: No results for input(s): AST, ALT, ALKPHOS, BILITOT, PROT, ALBUMIN in the last 168 hours. No results for input(s): LIPASE, AMYLASE in the last 168 hours. No results for input(s): AMMONIA in the last 168 hours. CBC: Recent Labs  Lab 08/29/21 2135 08/30/21 0430  WBC 9.4 8.3  NEUTROABS 6.2  --   HGB 12.4* 11.5*  HCT 38.3* 35.5*  MCV 92.7 92.7  PLT 267 253   Cardiac Enzymes: No results for input(s): CKTOTAL, CKMB, CKMBINDEX, TROPONINI in the last 168 hours. BNP: Invalid input(s): POCBNP CBG: Recent Labs  Lab 08/31/21 1339  GLUCAP 105*   D-Dimer No results for input(s): DDIMER in the last 72 hours. Hgb A1c No results for input(s): HGBA1C in the last 72 hours. Lipid Profile No results for input(s): CHOL, HDL, LDLCALC, TRIG, CHOLHDL, LDLDIRECT in the last 72 hours. Thyroid function studies No results for input(s): TSH, T4TOTAL, T3FREE, THYROIDAB in the last 72 hours.  Invalid input(s): FREET3 Anemia work  up No results for input(s): VITAMINB12, FOLATE, FERRITIN, TIBC, IRON, RETICCTPCT in the last 72 hours. Urinalysis    Component Value Date/Time   COLORURINE YELLOW 05/05/2021 0508   APPEARANCEUR CLEAR 05/05/2021 0508   LABSPEC 1.010 05/05/2021 0508   PHURINE 6.0 05/05/2021 0508   GLUCOSEU NEGATIVE 05/05/2021 0508   HGBUR NEGATIVE 05/05/2021 0508   BILIRUBINUR NEGATIVE 05/05/2021 0508   KETONESUR NEGATIVE 05/05/2021 0508   PROTEINUR NEGATIVE 05/05/2021 0508   UROBILINOGEN 0.2 05/13/2014 0658   NITRITE NEGATIVE 05/05/2021 0508   LEUKOCYTESUR NEGATIVE 05/05/2021 0508   Sepsis Labs Invalid input(s): PROCALCITONIN,  WBC,  LACTICIDVEN Microbiology Recent Results (from the past 240 hour(s))  Resp Panel  by RT-PCR (Flu A&B, Covid) Nasopharyngeal Swab     Status: None   Collection Time: 08/29/21  9:12 PM   Specimen: Nasopharyngeal Swab; Nasopharyngeal(NP) swabs in vial transport medium  Result Value Ref Range Status   SARS Coronavirus 2 by RT PCR NEGATIVE NEGATIVE Final    Comment: (NOTE) SARS-CoV-2 target nucleic acids are NOT DETECTED.  The SARS-CoV-2 RNA is generally detectable in upper respiratory specimens during the acute phase of infection. The lowest concentration of SARS-CoV-2 viral copies this assay can detect is 138 copies/mL. A negative result does not preclude SARS-Cov-2 infection and should not be used as the sole basis for treatment or other patient management decisions. A negative result may occur with  improper specimen collection/handling, submission of specimen other than nasopharyngeal swab, presence of viral mutation(s) within the areas targeted by this assay, and inadequate number of viral copies(<138 copies/mL). A negative result must be combined with clinical observations, patient history, and epidemiological information. The expected result is Negative.  Fact Sheet for Patients:  EntrepreneurPulse.com.au  Fact Sheet for Healthcare  Providers:  IncredibleEmployment.be  This test is no t yet approved or cleared by the [place] FDA and  has been authorized for detection and/or diagnosis of SARS-CoV-2 by FDA under an Emergency Use Authorization (EUA). This EUA will remain  in effect (meaning this test can be used) for the duration of the COVID-19 declaration under Section 564(b)(1) of the Act, 21 U.S.C.section 360bbb-3(b)(1), unless the authorization is terminated  or revoked sooner.       Influenza A by PCR NEGATIVE NEGATIVE Final   Influenza B by PCR NEGATIVE NEGATIVE Final    Comment: (NOTE) The Xpert Xpress SARS-CoV-2/FLU/RSV plus assay is intended as an aid in the diagnosis of influenza from Nasopharyngeal swab specimens and should not be used as a sole basis for treatment. Nasal washings and aspirates are unacceptable for Xpert Xpress SARS-CoV-2/FLU/RSV testing.  Fact Sheet for Patients: EntrepreneurPulse.com.au  Fact Sheet for Healthcare Providers: IncredibleEmployment.be  This test is not yet approved or cleared by the [place] FDA and has been authorized for detection and/or diagnosis of SARS-CoV-2 by FDA under an Emergency Use Authorization (EUA). This EUA will remain in effect (meaning this test can be used) for the duration of the COVID-19 declaration under Section 564(b)(1) of the Act, 21 U.S.C. section 360bbb-3(b)(1), unless the authorization is terminated or revoked.  Performed at [facility], [place] [address], [place], [place] [address]      Time coordinating discharge: 35 minutes  SIGNED:   Cordelia Poche, MD [facility] 09/03/2021, 10:57 AM

## 2021-09-03 NOTE — Plan of Care (Signed)
Discharge instructions given to the patient.

## 2021-09-04 ENCOUNTER — Ambulatory Visit
Admission: RE | Admit: 2021-09-04 | Discharge: 2021-09-04 | Disposition: A | Discharge status: Home / Self Care | Payer: Medicare Other | Source: Ambulatory Visit | Attending: Radiation Oncology | Admitting: Radiation Oncology

## 2021-09-04 ENCOUNTER — Telehealth: Payer: Self-pay | Admitting: Internal Medicine

## 2021-09-04 ENCOUNTER — Other Ambulatory Visit: Payer: Self-pay

## 2021-09-04 DIAGNOSIS — R911 Solitary pulmonary nodule: Secondary | ICD-10-CM

## 2021-09-04 DIAGNOSIS — C3491 Malignant neoplasm of unspecified part of right bronchus or lung: Secondary | ICD-10-CM | POA: Diagnosis present

## 2021-09-04 MED ORDER — SONAFINE EX EMUL
1.0000 "application " | Freq: Once | CUTANEOUS | Status: AC
  Administered 2021-09-04: 1 via TOPICAL

## 2021-09-04 NOTE — Telephone Encounter (Signed)
R/s pt's missed appt with Dr. [name] due to hospitalization. Called pt, no answer. Left msg with new appt date and time. Requested for pt to call me back to confirm appt.

## 2021-09-05 IMAGING — CT CT ANGIO CHEST
2 of 8 series · 19 of 36 positions shown · IV contrast (Omnipaque)
Comparison: 04/11/2019

CLINICAL DATA: Shortness of breath

EXAM:
CT ANGIOGRAPHY CHEST WITH CONTRAST
TECHNIQUE: Multidetector CT imaging of the chest was performed using the
standard protocol during bolus administration of intravenous
contrast. Multiplanar CT image reconstructions and MIPs were
obtained to evaluate the vascular anatomy.
CONTRAST:  100mL OMNIPAQUE IOHEXOL 350 MG/ML SOLN

[Series 6: pe coronal mpr · coronal · 0.66mm/px · 1 of 137 slices shown]
[im 69/137  mediastinal]
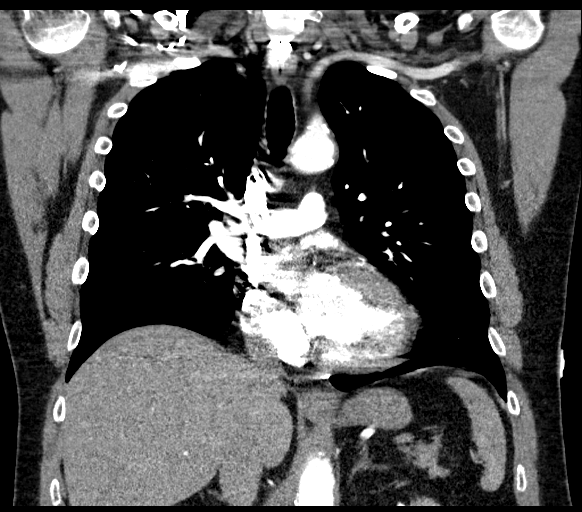

[Series 10: pe thins · axial · 0.74mm/px · z∈[-346,-57]mm · 18 of 324 slices shown]
[im 18/324  lung]
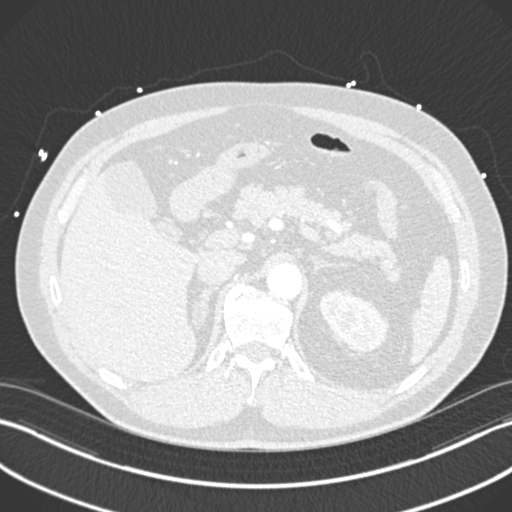
[im 35/324  mediastinal]
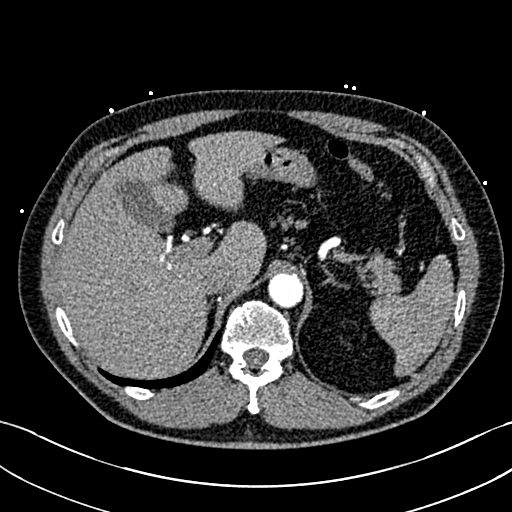
[im 52/324  lung]
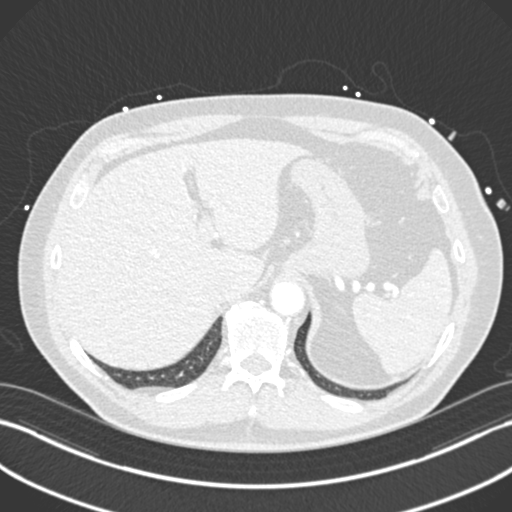
[im 69/324  mediastinal]
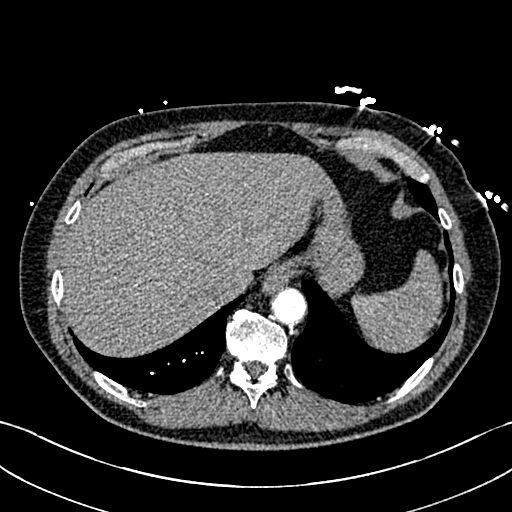
[im 86/324  lung]
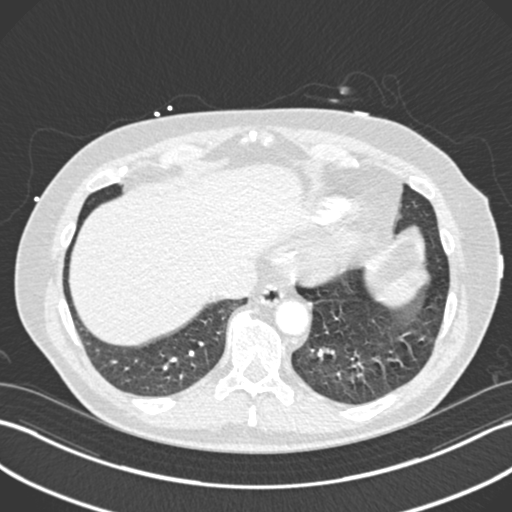
[im 103/324  mediastinal]
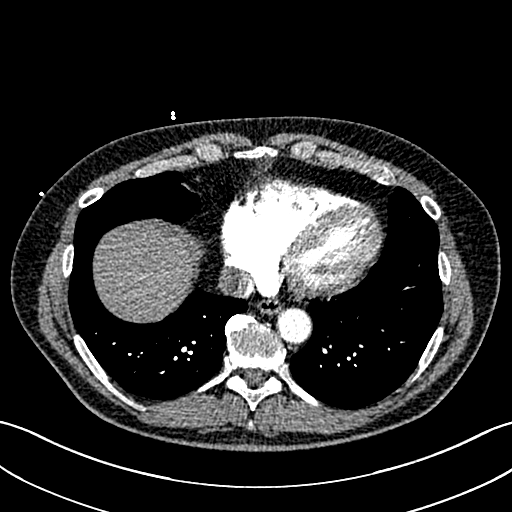
[im 120/324  lung]
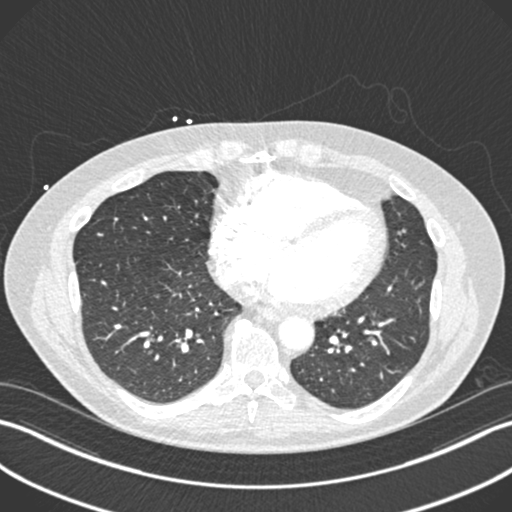
[im 137/324  mediastinal]
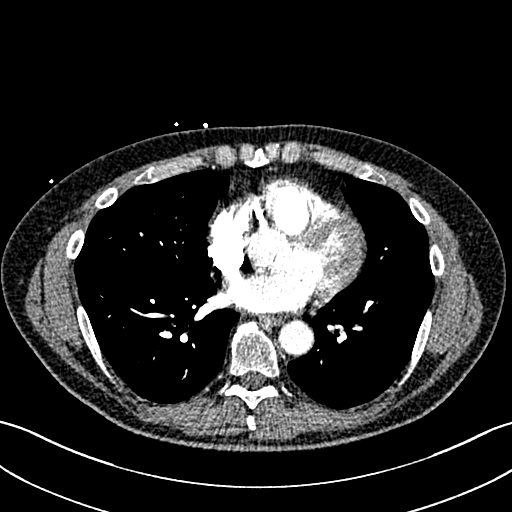
[im 154/324  lung]
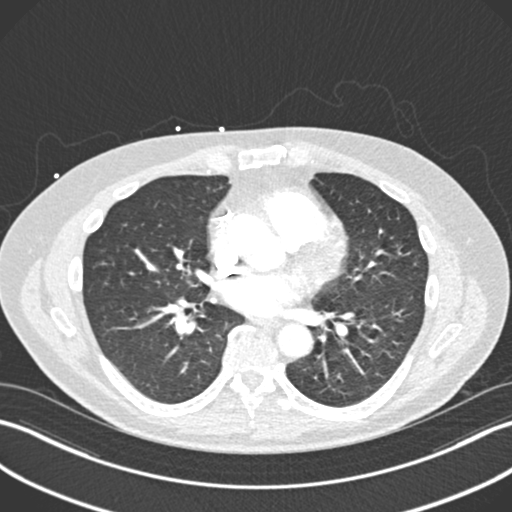
[im 171/324  mediastinal]
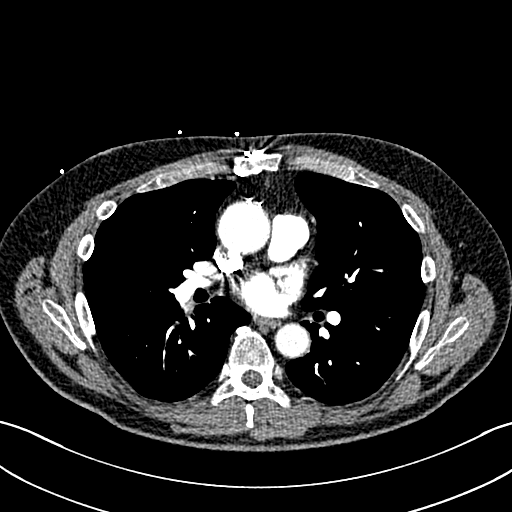
[im 188/324  lung]
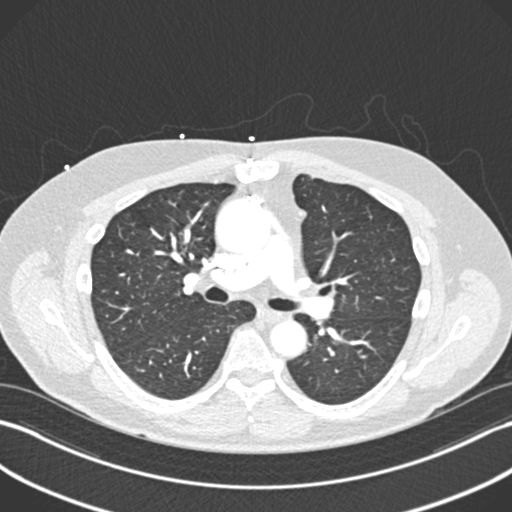
[im 205/324  mediastinal]
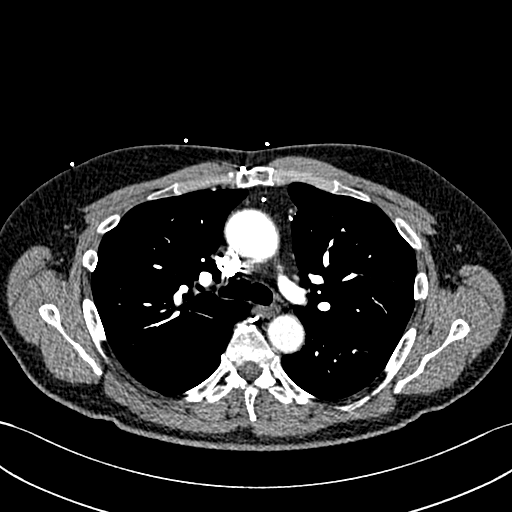
[im 222/324  lung]
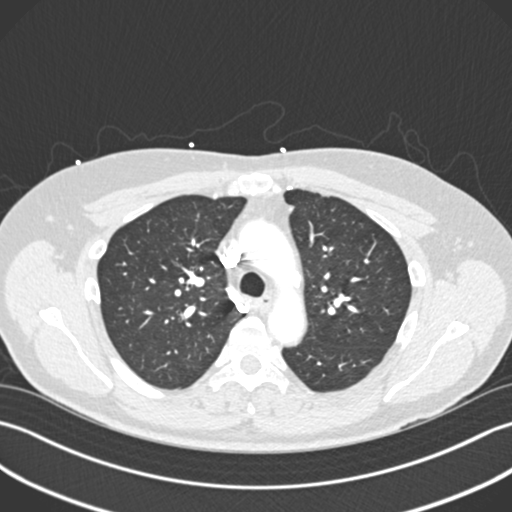
[im 239/324  mediastinal]
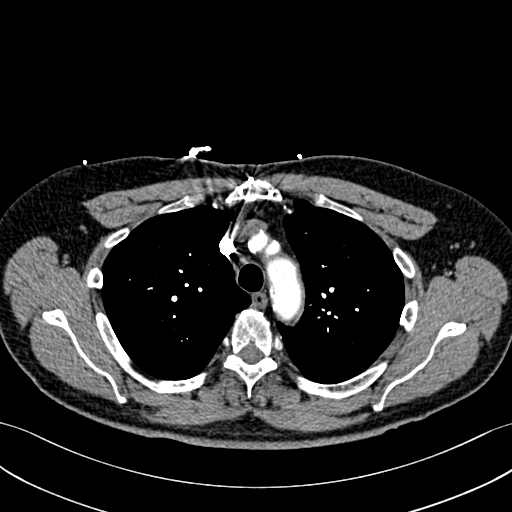
[im 256/324  lung]
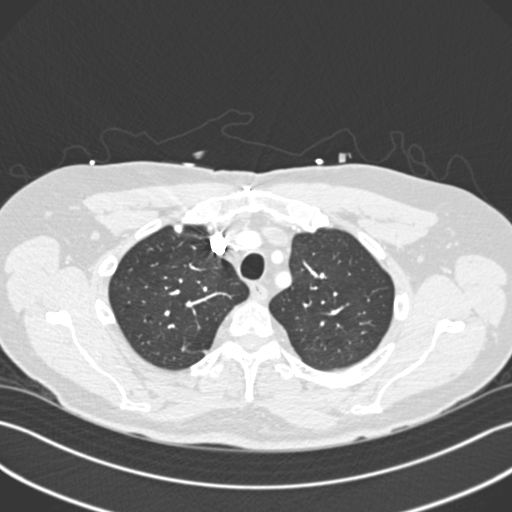
[im 273/324  mediastinal]
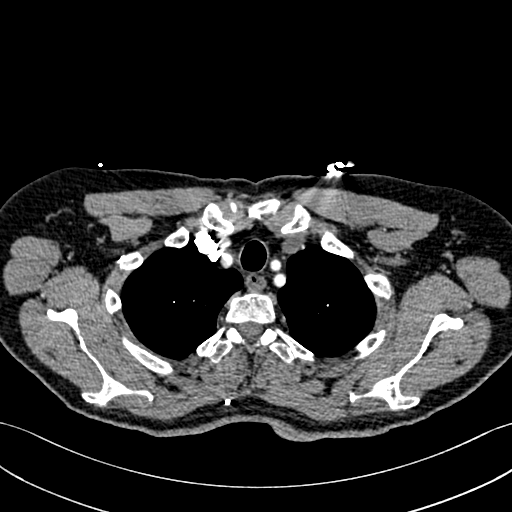
[im 290/324  lung]
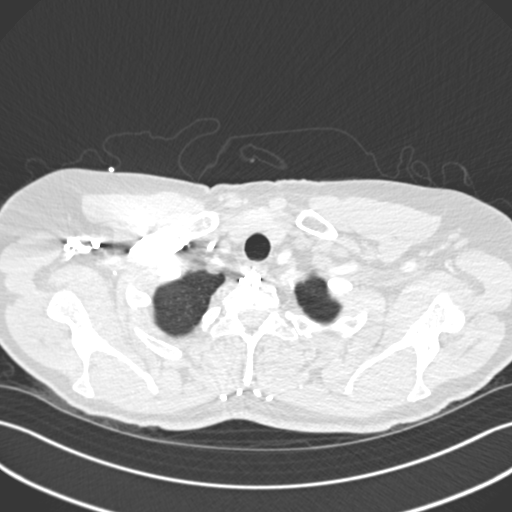
[im 307/324  mediastinal]
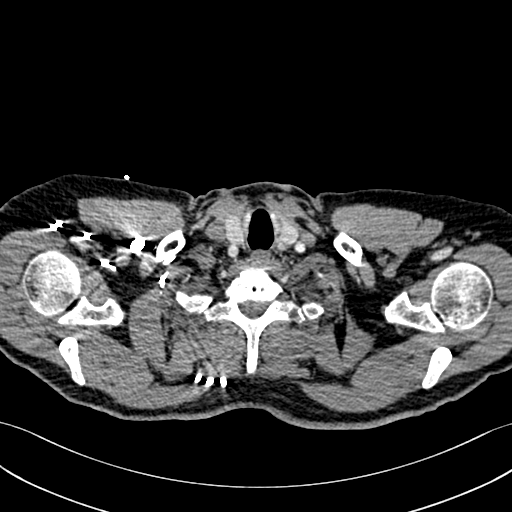

[19 of 36 positions shown; findings below may reference images not displayed]

FINDINGS: Cardiovascular: Satisfactory opacification of the pulmonary arteries
to the segmental level. No evidence of pulmonary embolism. Normal
heart size. No pericardial effusion. Evidence of prior CABG.
Calcified plaque is present the thoracic aorta.

Mediastinum/Nodes: No mediastinal, hilar, or axillary adenopathy.
Thyroid and esophagus are unremarkable.

Lungs/Pleura: No pleural effusion or pneumothorax. No consolidation
or mass.

Upper Abdomen: No acute abnormality.

Musculoskeletal: Anterior fusion at C7-T1. No acute osseous
abnormality.

Review of the MIP images confirms the above findings.
IMPRESSION: No evidence of acute pulmonary embolism. No additional acute
finding.

Aortic Atherosclerosis ([id number]).

## 2021-09-05 IMAGING — CR DG CHEST 2V
2 series · 2 of 2 positions shown · non-contrast
Comparison: 04/11/2019

CLINICAL DATA: Chest pain

EXAM:
CHEST - 2 VIEW

[w chest pa]
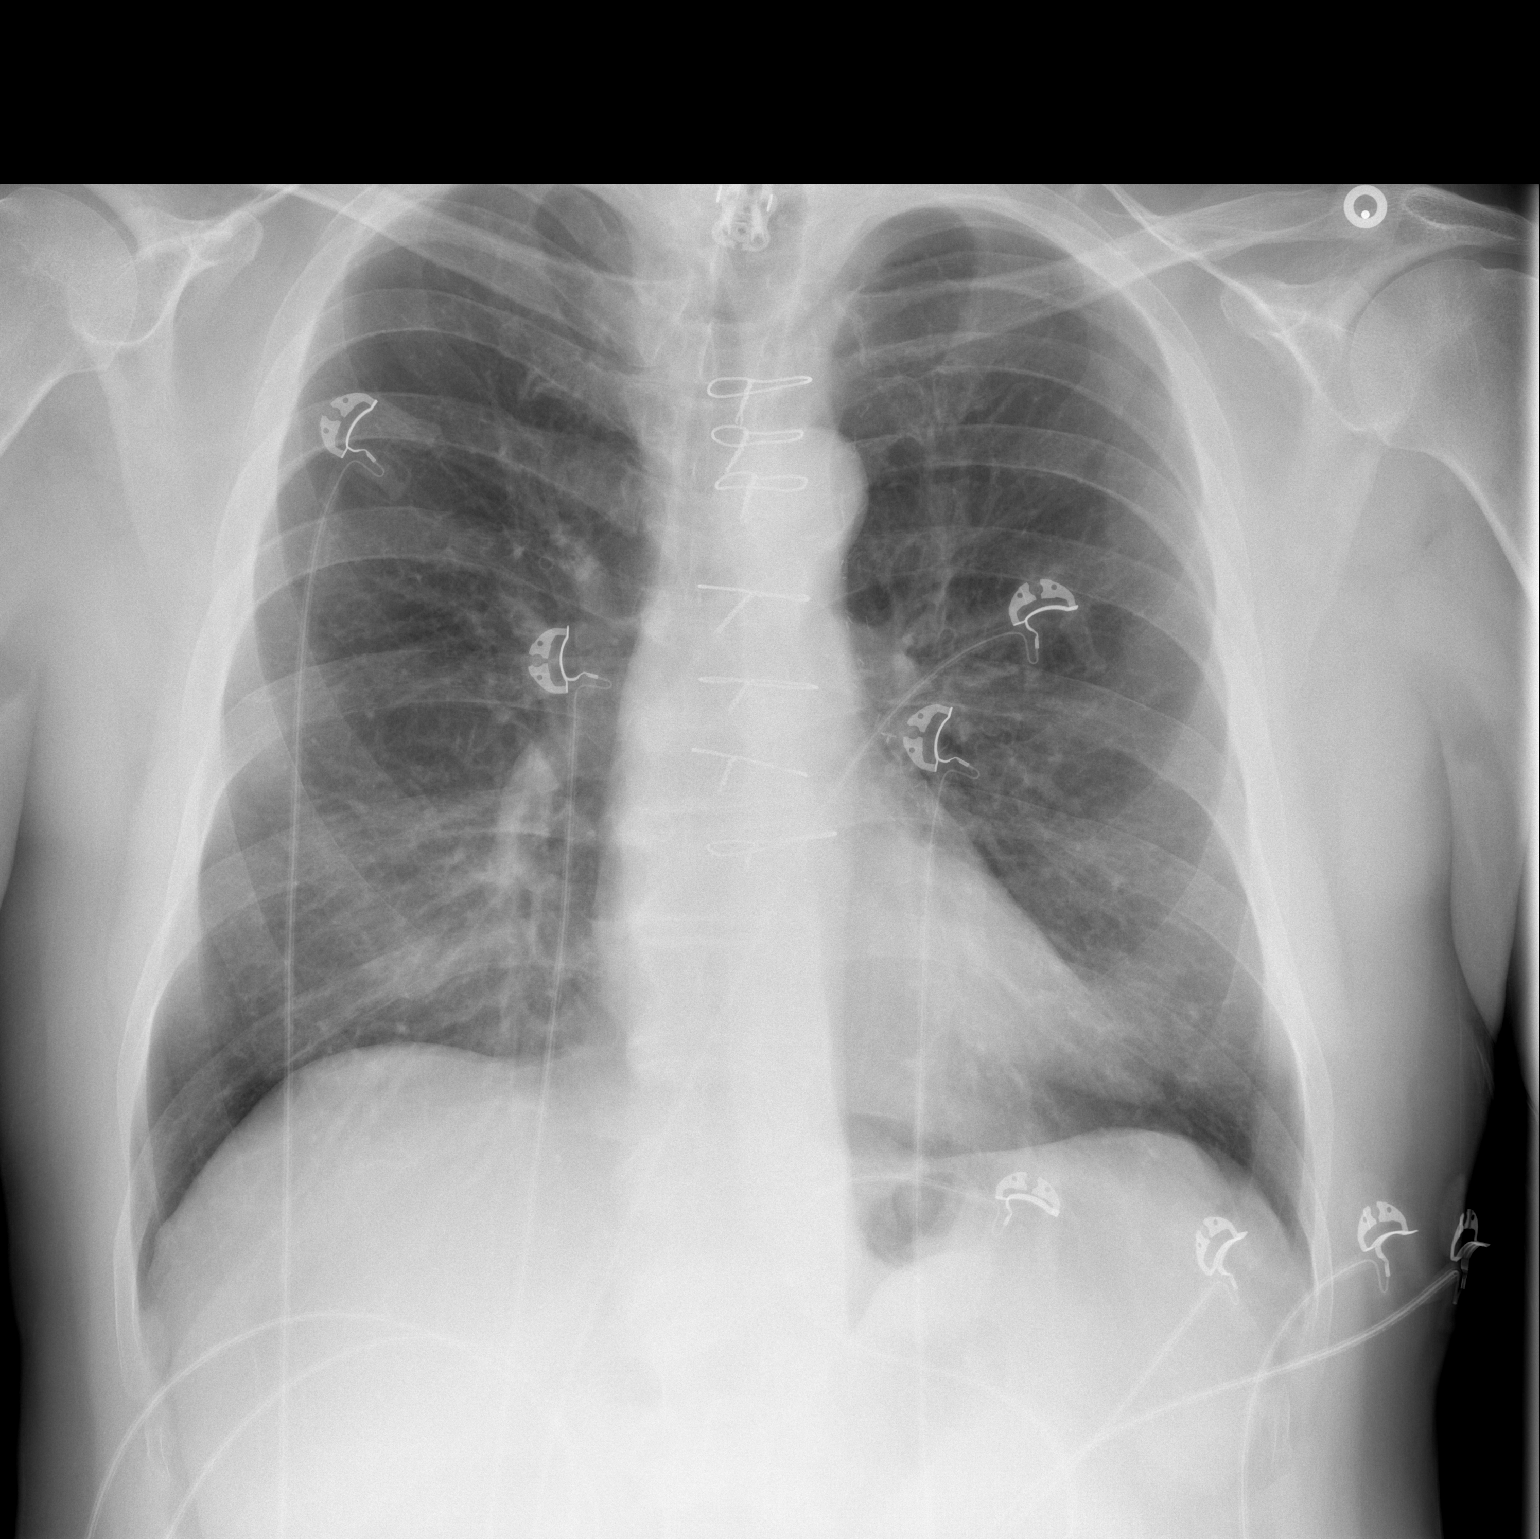

[w chest lat]
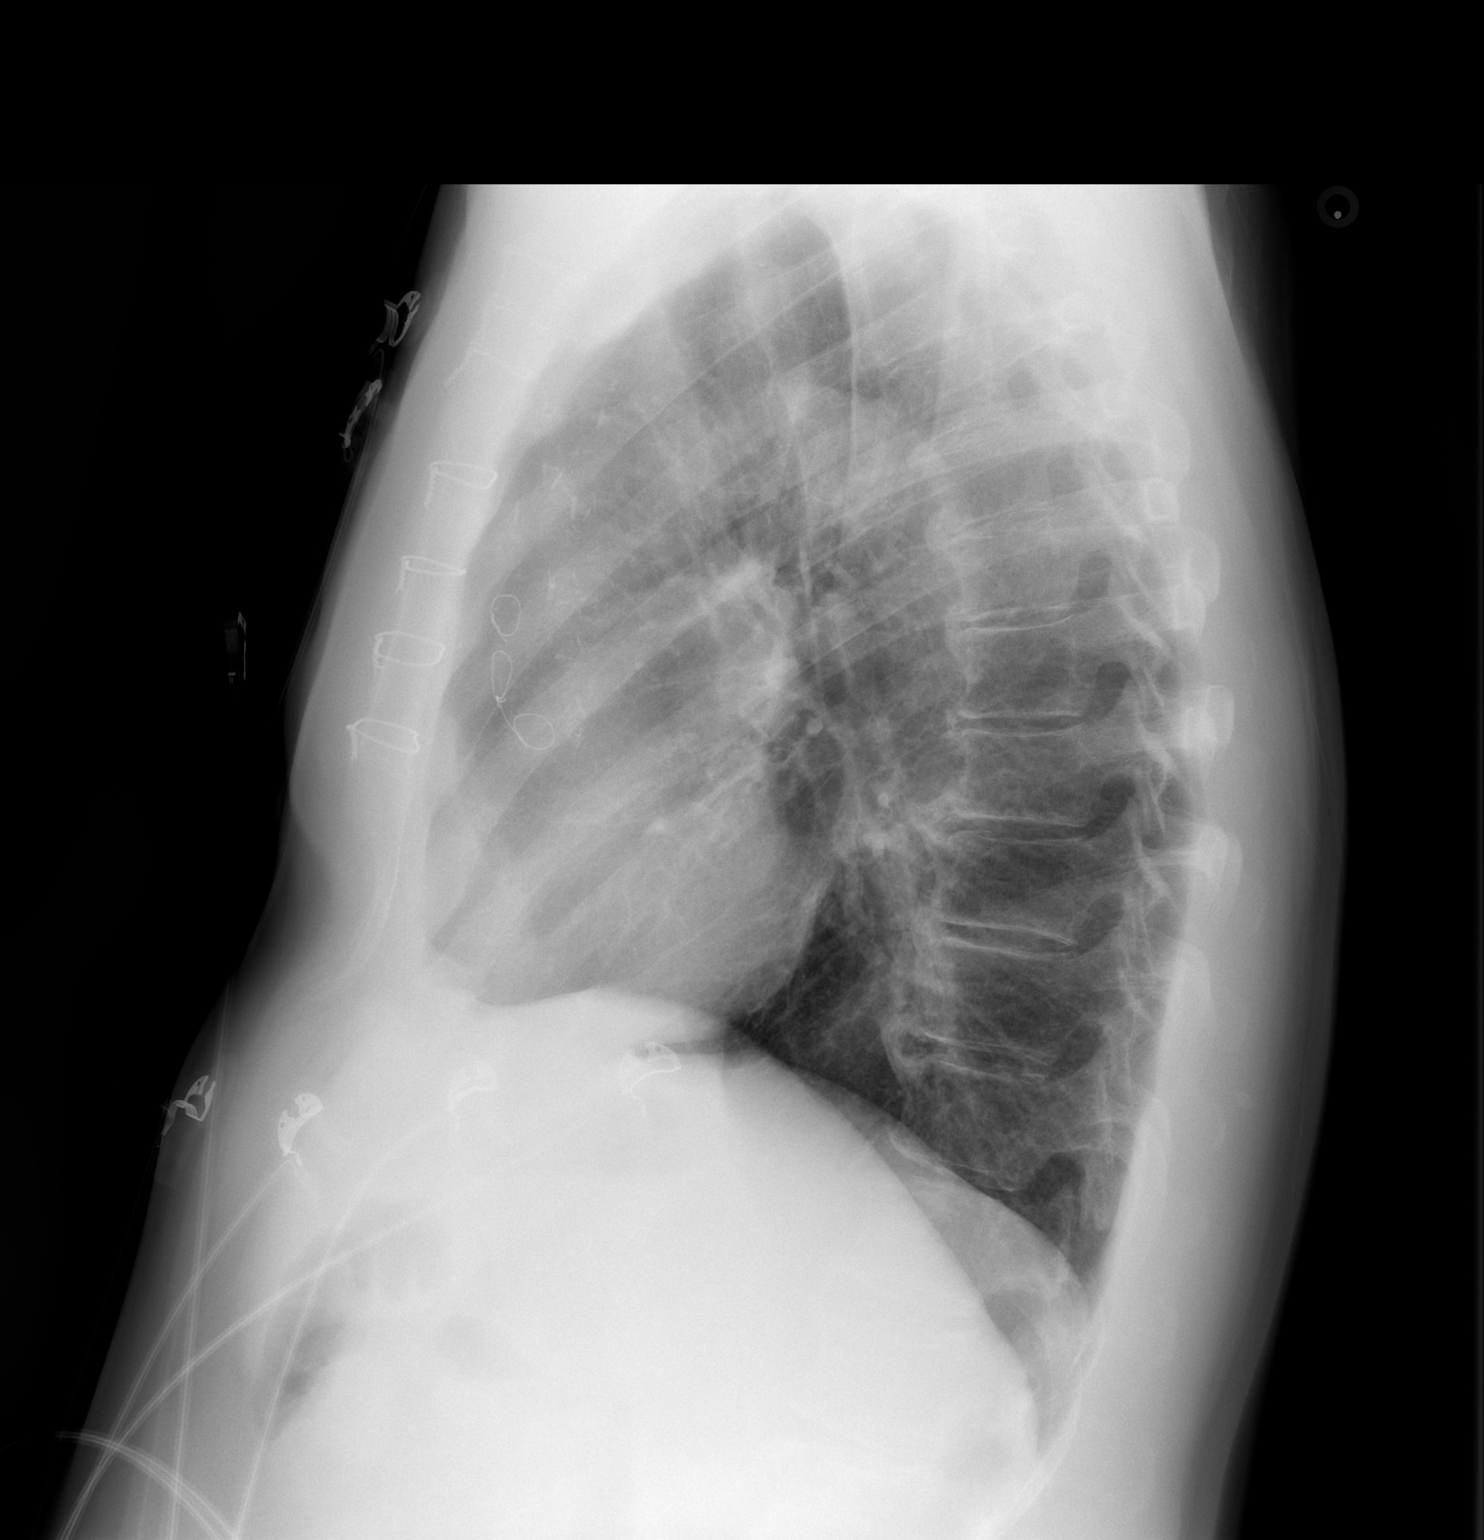

[2 of 2 positions shown; findings below may reference images not displayed]

FINDINGS: Patchy density at the lung bases. No pleural effusion or
pneumothorax. Cardiomediastinal contours are within normal limits.
No acute osseous abnormality.
IMPRESSION: Patchy atelectasis/consolidation at the lung bases.

## 2021-09-07 ENCOUNTER — Ambulatory Visit
Admission: RE | Admit: 2021-09-07 | Discharge: 2021-09-07 | Disposition: A | Discharge status: Home / Self Care | Payer: Medicare Other | Source: Ambulatory Visit | Attending: Radiation Oncology | Admitting: Radiation Oncology

## 2021-09-07 ENCOUNTER — Other Ambulatory Visit: Payer: Self-pay

## 2021-09-07 ENCOUNTER — Encounter: Payer: Self-pay | Admitting: *Deleted

## 2021-09-07 DIAGNOSIS — R911 Solitary pulmonary nodule: Secondary | ICD-10-CM | POA: Diagnosis not present

## 2021-09-07 NOTE — Progress Notes (Signed)
Per Dr. [name], I notified scheduling to call patient for him to be seen on 1/11.

## 2021-09-08 ENCOUNTER — Encounter: Payer: Self-pay | Admitting: Internal Medicine

## 2021-09-08 ENCOUNTER — Ambulatory Visit
Admission: RE | Admit: 2021-09-08 | Discharge: 2021-09-08 | Disposition: A | Discharge status: Home / Self Care | Payer: Medicare Other | Source: Ambulatory Visit | Attending: Radiation Oncology | Admitting: Radiation Oncology

## 2021-09-08 ENCOUNTER — Encounter: Payer: Self-pay | Admitting: Physician Assistant

## 2021-09-08 DIAGNOSIS — R911 Solitary pulmonary nodule: Secondary | ICD-10-CM | POA: Diagnosis not present

## 2021-09-08 NOTE — Progress Notes (Addendum)
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 He continues with plans for palliative radiotherapy to the right chest wall.   Plan: The patient will receive a call in about one month from the radiation oncology department. He will continue follow up with Dr. [name] and  as well.   ________________________________________________    Carola Rhine, [facility]

## 2021-09-09 ENCOUNTER — Telehealth: Payer: Self-pay | Admitting: Medical Oncology

## 2021-09-09 ENCOUNTER — Encounter: Payer: Self-pay | Admitting: *Deleted

## 2021-09-09 ENCOUNTER — Encounter: Payer: Self-pay | Admitting: Nurse Practitioner

## 2021-09-09 ENCOUNTER — Telehealth: Payer: Self-pay

## 2021-09-09 ENCOUNTER — Inpatient Hospital Stay: Payer: Medicare Other | Attending: Physician Assistant | Admitting: Nurse Practitioner

## 2021-09-09 ENCOUNTER — Other Ambulatory Visit: Payer: Self-pay

## 2021-09-09 ENCOUNTER — Inpatient Hospital Stay (HOSPITAL_BASED_OUTPATIENT_CLINIC_OR_DEPARTMENT_OTHER): Payer: Medicare Other | Admitting: Internal Medicine

## 2021-09-09 ENCOUNTER — Other Ambulatory Visit (HOSPITAL_COMMUNITY): Payer: Self-pay

## 2021-09-09 ENCOUNTER — Ambulatory Visit
Admission: RE | Admit: 2021-09-09 | Discharge: 2021-09-09 | Disposition: A | Discharge status: Home / Self Care | Payer: Medicare Other | Source: Ambulatory Visit | Attending: Radiation Oncology | Admitting: Radiation Oncology

## 2021-09-09 VITALS — BP 112/63 | HR 121 | Temp 99.4°F | Resp 18 | Ht 69.0 in | Wt 171.2 lb

## 2021-09-09 DIAGNOSIS — C3491 Malignant neoplasm of unspecified part of right bronchus or lung: Secondary | ICD-10-CM | POA: Diagnosis not present

## 2021-09-09 DIAGNOSIS — Z7189 Other specified counseling: Secondary | ICD-10-CM

## 2021-09-09 DIAGNOSIS — G893 Neoplasm related pain (acute) (chronic): Secondary | ICD-10-CM

## 2021-09-09 DIAGNOSIS — C7A1 Malignant poorly differentiated neuroendocrine tumors: Secondary | ICD-10-CM

## 2021-09-09 DIAGNOSIS — R911 Solitary pulmonary nodule: Secondary | ICD-10-CM | POA: Diagnosis not present

## 2021-09-09 DIAGNOSIS — R531 Weakness: Secondary | ICD-10-CM | POA: Diagnosis not present

## 2021-09-09 DIAGNOSIS — C7A8 Other malignant neuroendocrine tumors: Secondary | ICD-10-CM | POA: Insufficient documentation

## 2021-09-09 DIAGNOSIS — Z5111 Encounter for antineoplastic chemotherapy: Secondary | ICD-10-CM | POA: Insufficient documentation

## 2021-09-09 DIAGNOSIS — Z5112 Encounter for antineoplastic immunotherapy: Secondary | ICD-10-CM | POA: Insufficient documentation

## 2021-09-09 DIAGNOSIS — Z79899 Other long term (current) drug therapy: Secondary | ICD-10-CM | POA: Insufficient documentation

## 2021-09-09 DIAGNOSIS — C7B8 Other secondary neuroendocrine tumors: Secondary | ICD-10-CM | POA: Insufficient documentation

## 2021-09-09 DIAGNOSIS — Z7901 Long term (current) use of anticoagulants: Secondary | ICD-10-CM | POA: Insufficient documentation

## 2021-09-09 DIAGNOSIS — Z515 Encounter for palliative care: Secondary | ICD-10-CM

## 2021-09-09 MED ORDER — GABAPENTIN 100 MG PO CAPS
ORAL_CAPSULE | ORAL | 0 refills | Status: DC
  Filled 2021-09-09: qty 150, 30d supply, fill #0

## 2021-09-09 MED ORDER — TIZANIDINE HCL 2 MG PO TABS
2.0000 mg | ORAL_TABLET | Freq: Every evening | ORAL | 0 refills | Status: DC | PRN
  Filled 2021-09-09: qty 30, 30d supply, fill #0

## 2021-09-09 MED ORDER — MORPHINE SULFATE 15 MG PO TABS
ORAL_TABLET | ORAL | 0 refills | Status: DC
  Filled 2021-09-10: qty 180, 15d supply, fill #0

## 2021-09-09 NOTE — Telephone Encounter (Signed)
Referral to [facility]- for 2nd opinion . I faxed demographic,insurance ,progress notes , genomics to new pt coordinator.

## 2021-09-09 NOTE — Progress Notes (Signed)
[facility]  [phone] [phone]   Name: [name] Date: 09/09/2021 MRN: [id number]  DOB: 04/08/1956  Patient Care Team: [name], MD as PCP - General (Family Medicine) [name], MD as PCP - Cardiology (Cardiology) Icard, Octavio Graves, DO as Consulting Physician (Pulmonary Disease) Valrie Hart, RN as Oncology Nurse Navigator (Oncology) Maryanna Shape, NP as Nurse Practitioner (Oncology) Curt Bears, MD as Consulting Physician (Oncology) Pickenpack-Cousar, Carlena Sax, NP as Nurse Practitioner (Nurse Practitioner)    INTERVAL HISTORY: [name] is a 66 y.o. 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 [name] was admitted on 08/29/2021 from home with uncontrolled intractable chest wall pain. Patient is currently undergoing radiation treatments.   SOCIAL HISTORY:     reports that [name] quit smoking about 5 years ago. His smoking use included cigarettes. [name] has a 33.00 pack-year smoking history. [name] has never used smokeless tobacco. [name] reports that [name] does not currently use alcohol. [name] reports that [name] does not use drugs.  ADVANCE DIRECTIVES:  None on file   CODE STATUS:   PAST MEDICAL HISTORY: Past Medical History:  Diagnosis Date   Anemia    Anxiety    Arthritis    Basal cell carcinoma (BCC) of forehead    CAD (coronary artery disease)    a. 10/2015 ant STEMI >> LHC with 3 v CAD; oLAD tx with POBA >> emergent CABG. b. Multiple evals since that time, early graft failure of SVG-RCA by cath 03/2016. c. 2/19 PCI/DES x1 to pRCA, normal EF.   Carotid artery disease ([place])    a. 40-59% BICA 02/2018.   Depression    Dyspnea    Ectopic atrial tachycardia  (HCC)    Esophageal reflux    eosinophil esophagitis   Family history of adverse reaction to anesthesia    "sister has PONV" (06/21/2017)   Former tobacco use    Gout    Hepatitis C    "treated and cured" (06/21/2017)   High cholesterol    History of blood transfusion    History of kidney stones    Hypertension    Ischemic cardiomyopathy    a. EF 25-30% at intraop TEE 4/17  //  b. Limited Echo 5/17 - EF 45-50%, mild ant HK. c. EF 55-65% by cath 09/2017.   Migraine    "3-4/yr" (06/21/2017)   Myocardial infarction ([place]) 10/2015   Palpitations    Pneumonia    Sinus bradycardia    a. HR dropping into 40s in 02/2016 -> BB reduced.   Stroke [facility]) 10/2016   "small one; sometimes my memory/cognitive issues" (06/21/2017)   Symptomatic hypotension    a. 02/2016 ER visit -> meds reduced.   Syncope    Wears dentures    Wears glasses     ALLERGIES:  is allergic to indomethacin, prednisone, tetanus toxoids, wellbutrin [bupropion], gabapentin, and varenicline.  MEDICATIONS:  Current Outpatient Medications  Medication Sig Dispense Refill   acetaminophen (TYLENOL) 500 MG tablet Take 1,000 mg by mouth every 6 (six) hours as needed for mild pain, fever or headache.     aspirin EC 81 MG tablet Take 81 mg by mouth daily. Swallow whole.     atorvastatin (LIPITOR) 80 MG tablet TAKE 1 TABLET(80 MG)  BY MOUTH DAILY (Patient taking differently: Take 80 mg by mouth daily.) 90 tablet 2   docusate sodium (COLACE) 100 MG capsule Take 100-200 mg by mouth daily.     DULoxetine 40 MG CPEP Take 40 mg by mouth every evening. 30 capsule 1   gabapentin (NEURONTIN) 100 MG capsule Take 1 capsule (100 mg total) by mouth at bedtime. Take 2 capsules (200 mg total) by mouth at bedtime. Take 1 capsule (100 mg) by mouth twice a day in addition to night dose. (Patient taking differently: Take 100-200 mg by mouth See admin instructions. 100mg  oral twice daily and 200mg  at bedtime) 30 capsule 1   lidocaine (LIDODERM) 5 %  Place 1 patch onto the skin daily. Remove & Discard patch within 12 hours or as directed by MD (Patient taking differently: Place 1 patch onto the skin daily as needed (pain). Remove & Discard patch within 12 hours or as directed by MD) 30 patch 0   metoprolol succinate (TOPROL-XL) 25 MG 24 hr tablet Take 1 tablet (25 mg total) by mouth daily. (Patient taking differently: Take 25 mg by mouth every evening.) 90 tablet 3   midodrine (PROAMATINE) 2.5 MG tablet Take 1 tablet (2.5 mg total) by mouth 3 (three) times daily with meals. (Patient taking differently: Take 2.5 mg by mouth 3 (three) times daily as needed (Hypotension).) 90 tablet 5   morphine (MS CONTIN) 30 MG 12 hr tablet Take 1 tablet by mouth every 8 hours. 90 tablet 0   morphine (MSIR) 15 MG tablet Take 1-2 tablets every 4 hours as needed for moderate to severe pain with food. 90 tablet 0   Multiple Vitamin (MULTIVITAMIN WITH MINERALS) TABS tablet Take 1 tablet by mouth daily.     ondansetron (ZOFRAN) 8 MG tablet Take 1 tablet (8 mg total) by mouth every 8 (eight) hours as needed for nausea or vomiting. Starting 3 days after chemotherapy 90 tablet 2   polyethylene glycol (MIRALAX / GLYCOLAX) 17 g packet Take 17 g by mouth daily. 14 each 0   ranolazine (RANEXA) 1000 MG SR tablet Take 1 tablet (1,000 mg total) by mouth 2 (two) times daily. 180 tablet 3   XARELTO 20 MG TABS tablet Take 20 mg by mouth every evening.     No current facility-administered medications for this visit.    VITAL SIGNS: There were no vitals taken for this visit. There were no vitals filed for this visit.  Estimated body mass index is 25.28 kg/m as calculated from the following:   Height as of an earlier encounter on 09/09/21: 5\' 9"  (1.753 m).   Weight as of an earlier encounter on 09/09/21: 171 lb 3.2 oz (77.7 kg).   PERFORMANCE STATUS (ECOG) : 1 - Symptomatic but completely ambulatory   Physical Exam General: NAD, appears fatigued Cardiovascular: regular rate  and rhythm Pulmonary: normal breathing pattern Neurological: Weakness, AAOx4  IMPRESSION:  [name] is here for follow-up visit s/p recent hospitalization due to uncontrolled pain. During this time I was directly involved in his care and assisted with ongoing symptom management needs. [name] shares most of his pain is in his back and chest wall area. Denies nausea, vomiting. Occasional headaches. [name] saw Dr. [name] today and has an upcoming appointment with Dr. [name]. [name] is currently undergoing radiation treatments and seems to be tolerating well.   Neoplasm related pain [name] was started on MS IR while hospitalized. After ongoing titration [name] has gained improved pain relief with MS IR  15-30 mg as needed in addition to MS Contin ER every 8 hours. [name] reports pain being fairly controlled since discharge with occasional episodes of increased pain. [name] shares that [name] had to call EMS last night because [name] woke up with a headache and the right side of his body felt "numb". [name] ultimately chose not to go to ED for evaluation as [name] began feeling better. Complains of back pain that radiates but due to severity unable to pinpoint main areas stating when pain hits it feels all over and severe. [name] tolerated robaxin while hospitalized for muscle spasms. [name] is currently not on this outpatient. We will consider including a muscle relaxant to his regimen to take at bedtime as needed. [name] is also taking gabapentin for additional support. [name] was able to work in his office for little over 5 hours which has not been the case in sometime.   Constipation No concerns at this time. Will continue on current bowel regimen of colace and miralax daily to prevent constipation in the setting of opioid use.   Goals of care [name] is aware that his cancer is not curative and all treatments are with palliative intent. [name] states [name] has accepted this but is remaining hopeful for some stability and additional time if possible. [name]  wishes to continue with all recommended and available treatments that can allow him some time. I approached discussions about how [name] and family are handling his diagnosis and change in quality of life in addition to poor prognosis. [name] is tearful expressing his wife is having a difficult time. [name] shares his distress in watching his family (wife and kids) see him decline expressing emotionally "they don't deserve this!" Emotional support provided. Encouraged ongoing discussions with family and for them to talk about their feelings/emotions which can include professional counseling. Outpatient hospice and palliative also offers family support. At this time [name] is not interested in hospice as [name] emphasizes his wishes to continue with treatment however with awareness at some point hospice support will be most appropriate.   I discussed the importance of continued conversation with family and their medical providers regarding overall plan of care and treatment options, ensuring decisions are within the context of the patients values and GOCs.  PLAN: MS Contin ER 30 mg every 8 hours (refills due on 10/02/2021) MS IR 15-30 mg as needed every 4 hours (refilled for 15 day supply on 1/12-next refill will be due on 09/23/2021) Gabapenting 300 mg QHS and 100 mg BID (refill sent today. Next due 10/09/2021) His insurance prefers tizanidine 2mg  QHS for muscle spasms (sent today for 30 day supply) Miralax and Colace daily for bowel regimen  I will plan to see him back in 2-3 weeks in collaboration with his other oncology appointments.    Patient expressed understanding and was in agreement with this plan. [name] also understands that [name] can call the clinic at any time with any questions, concerns, or complaints.     Time Total: 35   Visit consisted of counseling and education dealing with the complex and emotionally intense issues of symptom management and palliative care in the setting of serious and potentially  life-threatening illness.Greater than 50%  of this time was spent counseling and coordinating care related to the above assessment and plan.  Signed by: Alda Lea, AGPCNP-BC [place]

## 2021-09-09 NOTE — Telephone Encounter (Signed)
[name] called our office to ask about the pain he is feeling in his R lower abdomen. [name], NP prescribed [name] Zanaflex to take at night at his office visit today and I reminded [name] of this. Per [name], NP, we won't be changing his pain medication regimen at this time. I explained this to [name] and understanding was verbalized. Advised to call back with any questions/concerns.

## 2021-09-09 NOTE — Progress Notes (Signed)
Oncology Nurse Navigator Documentation  Oncology Nurse Navigator Flowsheets 09/09/2021  Navigator Location [facility]  Navigator Encounter Type Clinic/MDC  Patient Visit Type MedOnc  Treatment Phase Treatment/I was able to meet with [name] at Dr. Worthy Flank appt.  He is going through a lot with treatment and disease progression.  Dr. [name] explained his plan of care to finish radiation then at his follow up to see Cassie, discuss chemotherapy.  He verbalized understanding.  Patient was then seen with Palliative care.  Patient requested handicap stick for car, Dr. [name] and Diane RN completed form. Patient is being referred to [facility]. I reached out to [place] there with an update on referral.    Barriers/Navigation Needs Coordination of Care  Interventions Coordination of Care  Acuity Level 3-Moderate Needs (3-4 Barriers Identified)  Coordination of Care Other  Time Spent with Patient 45

## 2021-09-09 NOTE — Progress Notes (Signed)
[place] [phone]   [phone]  OFFICE PROGRESS NOTE  Anthony Melter, MD [name] [place] [address]  DIAGNOSIS:  Stage IV (pT1c, N1, M1b) large cell neuroendocrine carcinoma.  He presented with a right upper lobe lung nodule and hilar lymphadenopathy.  He was diagnosed in April 2022. He also had a small metastatic brain lesion.  The patient has local recurrence of his disease and the right chest wall diagnosed in November 2022. Molecular studies by foundation 1 showed no actionable mutations  PDL1: 0%   PRIOR THERAPY:  1) Robotic assisted thoracic surgery with a right upper lobectomy which was performed on 12/24/2020.  2) SRS to a small metastatic brain lesion completed on February 13, 2021 under the care of Dr. Lisbeth Renshaw.   3) Adjuvant chemotherapy with cisplatin 80 mg/m2 on day 1, etoposide 100 mg per metered squared on days 1, 2, and 3, IV every 3 weeks. First dose on 02/09/21.  Status post 4 cycles. 4) SRS to 4 new brain metastasis under the care of Dr. Lisbeth Renshaw.   CURRENT THERAPY: Palliative radiotherapy to the local disease recurrence on the right chest wall under the care of Dr. Lisbeth Renshaw.   INTERVAL HISTORY: [name] 66 y.o. male returns to the clinic today for follow-up visit accompanied by his son [name].  The patient continues to have pain all over his body especially on the right side of the chest and back.  He denied having any current shortness of breath, cough or hemoptysis.  He denied having any current nausea, vomiting, diarrhea or constipation.  He has no headache or visual changes.  He has no weight loss or night sweats.  He is currently undergoing palliative radiotherapy to the local disease recurrence on the right side of the chest wall under the care of Dr. Lisbeth Renshaw.  He also completed SRS to 4 new brain metastasis.  The patient is still have a lot of questions about his care and treatment options.  He is followed by the  palliative care clinic for his pain management.  He is currently on several medications including MS Contin, MS IR in addition to gabapentin.  He is here today for evaluation and discussion of his future treatment options.   MEDICAL HISTORY: Past Medical History:  Diagnosis Date   Anemia    Anxiety    Arthritis    Basal cell carcinoma (BCC) of forehead    CAD (coronary artery disease)    a. 10/2015 ant STEMI >> LHC with 3 v CAD; oLAD tx with POBA >> emergent CABG. b. Multiple evals since that time, early graft failure of SVG-RCA by cath 03/2016. c. 2/19 PCI/DES x1 to pRCA, normal EF.   Carotid artery disease ([place])    a. 40-59% BICA 02/2018.   Depression    Dyspnea    Ectopic atrial tachycardia (HCC)    Esophageal reflux    eosinophil esophagitis   Family history of adverse reaction to anesthesia    "sister has PONV" (06/21/2017)   Former tobacco use    Gout    Hepatitis C    "treated and cured" (06/21/2017)   High cholesterol    History of blood transfusion    History of kidney stones    Hypertension    Ischemic cardiomyopathy    a. EF 25-30% at intraop TEE 4/17  //  b. Limited Echo 5/17 - EF 45-50%, mild ant HK. c. EF 55-65% by  cath 09/2017.   Migraine    "3-4/yr" (06/21/2017)   Myocardial infarction ([place]) 10/2015   Palpitations    Pneumonia    Sinus bradycardia    a. HR dropping into 40s in 02/2016 -> BB reduced.   Stroke [facility]) 10/2016   "small one; sometimes my memory/cognitive issues" (06/21/2017)   Symptomatic hypotension    a. 02/2016 ER visit -> meds reduced.   Syncope    Wears dentures    Wears glasses     ALLERGIES:  is allergic to indomethacin, prednisone, tetanus toxoids, wellbutrin [bupropion], gabapentin, and varenicline.  MEDICATIONS:  Current Outpatient Medications  Medication Sig Dispense Refill   acetaminophen (TYLENOL) 500 MG tablet Take 1,000 mg by mouth every 6 (six) hours as needed for mild pain, fever or headache.     aspirin EC 81 MG tablet Take  81 mg by mouth daily. Swallow whole.     atorvastatin (LIPITOR) 80 MG tablet TAKE 1 TABLET(80 MG) BY MOUTH DAILY (Patient taking differently: Take 80 mg by mouth daily.) 90 tablet 2   docusate sodium (COLACE) 100 MG capsule Take 100-200 mg by mouth daily.     DULoxetine 40 MG CPEP Take 40 mg by mouth every evening. 30 capsule 1   gabapentin (NEURONTIN) 100 MG capsule Take 1 capsule (100 mg total) by mouth at bedtime. Take 2 capsules (200 mg total) by mouth at bedtime. Take 1 capsule (100 mg) by mouth twice a day in addition to night dose. (Patient taking differently: Take 100-200 mg by mouth See admin instructions. 111m oral twice daily and 2038mat bedtime) 30 capsule 1   lidocaine (LIDODERM) 5 % Place 1 patch onto the skin daily. Remove & Discard patch within 12 hours or as directed by MD (Patient taking differently: Place 1 patch onto the skin daily as needed (pain). Remove & Discard patch within 12 hours or as directed by MD) 30 patch 0   metoprolol succinate (TOPROL-XL) 25 MG 24 hr tablet Take 1 tablet (25 mg total) by mouth daily. (Patient taking differently: Take 25 mg by mouth every evening.) 90 tablet 3   midodrine (PROAMATINE) 2.5 MG tablet Take 1 tablet (2.5 mg total) by mouth 3 (three) times daily with meals. (Patient taking differently: Take 2.5 mg by mouth 3 (three) times daily as needed (Hypotension).) 90 tablet 5   morphine (MS CONTIN) 30 MG 12 hr tablet Take 1 tablet by mouth every 8 hours. 90 tablet 0   morphine (MSIR) 15 MG tablet Take 1-2 tablets every 4 hours as needed for moderate to severe pain with food. 90 tablet 0   Multiple Vitamin (MULTIVITAMIN WITH MINERALS) TABS tablet Take 1 tablet by mouth daily.     ondansetron (ZOFRAN) 8 MG tablet Take 1 tablet (8 mg total) by mouth every 8 (eight) hours as needed for nausea or vomiting. Starting 3 days after chemotherapy 90 tablet 2   polyethylene glycol (MIRALAX / GLYCOLAX) 17 g packet Take 17 g by mouth daily. 14 each 0   ranolazine  (RANEXA) 1000 MG SR tablet Take 1 tablet (1,000 mg total) by mouth 2 (two) times daily. 180 tablet 3   XARELTO 20 MG TABS tablet Take 20 mg by mouth every evening.     No current facility-administered medications for this visit.    SURGICAL HISTORY:  Past Surgical History:  Procedure Laterality Date   25 GAUGE PARS PLANA VITRECTOMY WITH 20 GAUGE MVR PORT  12/31/2020   Procedure: 25 GAUGE PARS PLANA VITRECTOMY  WITH 20 GAUGE MVR PORT;  Surgeon: [name], MD;  Location: [place];  Service: Thoracic;;   ANTERIOR CERVICAL DECOMP/DISCECTOMY FUSION N/A 10/17/2018   Procedure: Anterior Cervical Decompression Fusion - Cervical seven -Thoracic one;  Surgeon: Consuella Lose, MD;  Location: [place];  Service: Neurosurgery;  Laterality: N/A;   BASAL CELL CARCINOMA EXCISION     "forehead   BIOPSY  07/20/2019   Procedure: BIOPSY;  Surgeon: Carol Ada, MD;  Location: [facility];  Service: Endoscopy;;   BIOPSY OF MEDIASTINAL MASS Right 07/29/2021   Procedure: RIGHT CHEST WALL MASS BIOPSY;  Surgeon: [name], MD;  Location: [place];  Service: Thoracic;  Laterality: Right;   BRONCHIAL BIOPSY  12/24/2020   Procedure: BRONCHIAL BIOPSIES;  Surgeon: Garner Nash, DO;  Location: [place] [facility];  Service: Pulmonary;;   BRONCHIAL BRUSHINGS  12/24/2020   Procedure: BRONCHIAL BRUSHINGS;  Surgeon: Garner Nash, DO;  Location: [place];  Service: Pulmonary;;   BRONCHIAL NEEDLE ASPIRATION BIOPSY  12/24/2020   Procedure: BRONCHIAL NEEDLE ASPIRATION BIOPSIES;  Surgeon: Garner Nash, DO;  Location: [place];  Service: Pulmonary;;   CARDIAC CATHETERIZATION N/A 11/28/2015   Procedure: Left Heart Cath and Coronary Angiography;  Surgeon: [name], MD;  Location: [place] [facility];  Service: Cardiovascular;  Laterality: N/A;   CARDIAC CATHETERIZATION N/A 11/28/2015   Procedure: Coronary Balloon Angioplasty;  Surgeon: [name], MD;  Location: [place] [facility];   Service: Cardiovascular;  Laterality: N/A;  ostial LAD   CARDIAC CATHETERIZATION N/A 11/28/2015   Procedure: Coronary/Graft Angiography;  Surgeon: [name], MD;  Location: [place] [facility];  Service: Cardiovascular;  Laterality: N/A;  coronaries only    CARDIAC CATHETERIZATION N/A 04/21/2016   Procedure: Left Heart Cath and Coronary Angiography;  Surgeon: Wellington Hampshire, MD;  Location: [place] [facility];  Service: Cardiovascular;  Laterality: N/A;   CARDIAC CATHETERIZATION N/A 06/14/2016   Procedure: Left Heart Cath and Cors/Grafts Angiography;  Surgeon: Lorretta Harp, MD;  Location: [place] [facility];  Service: Cardiovascular;  Laterality: N/A;   CARDIAC CATHETERIZATION N/A 09/08/2016   Procedure: Left Heart Cath and Cors/Grafts Angiography;  Surgeon: Wellington Hampshire, MD;  Location: [place] [facility];  Service: Cardiovascular;  Laterality: N/A;   CARDIAC CATHETERIZATION     CORONARY ARTERY BYPASS GRAFT N/A 11/28/2015   Procedure: CORONARY ARTERY BYPASS GRAFTING (CABG) TIMES FIVE USING LEFT INTERNAL MAMMARY ARTERY AND RIGHT GREATER SAPHENOUS,VIEN HARVEATED BY ENDOVIEN, INTRAOPPRATIVE TEE;  Surgeon: Gaye Pollack, MD;  Location: [place];  Service: Open Heart Surgery;  Laterality: N/A;   CORONARY STENT INTERVENTION N/A 10/05/2017   Procedure: CORONARY STENT INTERVENTION;  Surgeon: [name], MD;  Location: [place] [facility];  Service: Cardiovascular;  Laterality: N/A;   ESOPHAGOGASTRODUODENOSCOPY (EGD) WITH PROPOFOL N/A 07/20/2019   Procedure: ESOPHAGOGASTRODUODENOSCOPY (EGD) WITH PROPOFOL;  Surgeon: Carol Ada, MD;  [facility];  Service: Endoscopy;  Laterality: N/A;   ESOPHAGOGASTRODUODENOSCOPY (EGD) WITH PROPOFOL N/A 01/30/2021   Procedure: ESOPHAGOGASTRODUODENOSCOPY (EGD) WITH PROPOFOL;  Surgeon: Carol Ada, MD;  [facility];  Service: Endoscopy;  Laterality: N/A;   FIDUCIAL MARKER PLACEMENT  12/24/2020   Procedure: FIDUCIAL DYE MARKER  PLACEMENT;  Surgeon: Garner Nash, DO;  Location: [place] [facility];  Service: Pulmonary;;   HUMERUS SURGERY Right 1969   "tumor inside bone; filled it w/bone chips"   INTERCOSTAL NERVE BLOCK Right 12/24/2020   Procedure: INTERCOSTAL NERVE BLOCK;  Surgeon: [name],  MD;  [facility];  Service: Thoracic;  Laterality: Right;   IR IMAGING GUIDED PORT INSERTION  02/06/2021   LEFT HEART CATH AND CORS/GRAFTS ANGIOGRAPHY N/A 03/11/2017   Procedure: Left Heart Cath and Cors/Grafts Angiography;  Surgeon: Leonie Man, MD;  Location: [place] [facility];  Service: Cardiovascular;  Laterality: N/A;   LEFT HEART CATH AND CORS/GRAFTS ANGIOGRAPHY N/A 10/05/2017   Procedure: LEFT HEART CATH AND CORS/GRAFTS ANGIOGRAPHY;  Surgeon: [name], MD;  Location: [place] [facility];  Service: Cardiovascular;  Laterality: N/A;   LEFT HEART CATH AND CORS/GRAFTS ANGIOGRAPHY N/A 04/11/2019   Procedure: LEFT HEART CATH AND CORS/GRAFTS ANGIOGRAPHY;  Surgeon: [name], MD;  Location: [place] [facility];  Service: Cardiovascular;  Laterality: N/A;   NODE DISSECTION Right 12/24/2020   Procedure: NODE DISSECTION;  Surgeon: [name], MD;  Location: [place];  Service: Thoracic;  Laterality: Right;   PERIPHERAL VASCULAR CATHETERIZATION N/A 06/14/2016   Procedure: Lower Extremity Angiography;  Surgeon: Lorretta Harp, MD;  Location: [place] [facility];  Service: Cardiovascular;  Laterality: N/A;   VIDEO BRONCHOSCOPY WITH ENDOBRONCHIAL NAVIGATION Right 12/24/2020   Procedure: VIDEO BRONCHOSCOPY WITH ENDOBRONCHIAL NAVIGATION;  Surgeon: Garner Nash, DO;  Location: [place];  Service: Pulmonary;  Laterality: Right;   VIDEO BRONCHOSCOPY WITH ENDOBRONCHIAL ULTRASOUND N/A 12/24/2020   Procedure: VIDEO BRONCHOSCOPY WITH ENDOBRONCHIAL ULTRASOUND;  Surgeon: Garner Nash, DO;  Location: [place];  Service: Pulmonary;  Laterality: N/A;   VIDEO BRONCHOSCOPY WITH INSERTION OF  INTERBRONCHIAL VALVE (IBV) N/A 12/31/2020   Procedure: VIDEO BRONCHOSCOPY WITH INSERTION OF INTERBRONCHIAL VALVE (IBV).VALVE IN CARTRIDGE 73m,9mm. CHEST TUBE PLACEMENT.;  Surgeon: LLajuana Matte MD;  [facility];  Service: Thoracic;  Laterality: N/A;   VIDEO BRONCHOSCOPY WITH INSERTION OF INTERBRONCHIAL VALVE (IBV) N/A 07/29/2021   Procedure: VIDEO BRONCHOSCOPY WITH REMOVAL OF INTERBRONCHIAL VALVE (IBV);  Surgeon: LLajuana Matte MD;  [facility];  Service: Thoracic;  Laterality: N/A;    REVIEW OF SYSTEMS:  Constitutional: positive for fatigue Eyes: negative Ears, nose, mouth, throat, and face: negative Respiratory: positive for pleurisy/chest pain Cardiovascular: negative Gastrointestinal: negative Genitourinary:negative Integument/breast: negative Hematologic/lymphatic: negative Musculoskeletal:positive for back pain Neurological: negative Behavioral/Psych: negative Endocrine: negative Allergic/Immunologic: negative   PHYSICAL EXAMINATION: General appearance: alert, cooperative, fatigued, and no distress Head: Normocephalic, without obvious abnormality, atraumatic Neck: no adenopathy, no JVD, supple, symmetrical, trachea midline, and thyroid not enlarged, symmetric, no tenderness/mass/nodules Lymph nodes: Cervical, supraclavicular, and axillary nodes normal. Resp: clear to auscultation bilaterally Back: negative Cardio: Tachycardic GI: soft, non-tender; bowel sounds normal; no masses,  no organomegaly Extremities: extremities normal, atraumatic, no cyanosis or edema Neurologic: Alert and oriented X 3, normal strength and tone. Normal symmetric reflexes. Normal coordination and gait  ECOG PERFORMANCE STATUS: 1 - Symptomatic but completely ambulatory  Blood pressure 112/63, pulse (!) 121, temperature 99.4 F (37.4 C), temperature source Tympanic, resp. rate 18, height _0  (1.753 m), weight 171 lb 3.2 oz (77.7 kg), SpO2 97 %.  LABORATORY DATA: Lab Results   Component Value Date   WBC 8.3 08/30/2021   HGB 11.5 (L) 08/30/2021   HCT 35.5 (L) 08/30/2021   MCV 92.7 08/30/2021   PLT 253 08/30/2021      Chemistry      Component Value Date/Time   NA 137 08/30/2021 0430   K 3.9 08/30/2021 0430   CL 106 08/30/2021 0430   CO2 26 08/30/2021 0430   BUN 23 08/30/2021 0430   CREATININE  1.12 08/30/2021 0430   CREATININE 1.17 08/13/2021 0942   CREATININE 1.00 02/12/2016 1038      Component Value Date/Time   CALCIUM 10.3 08/30/2021 0430   ALKPHOS 67 08/19/2021 0522   AST 17 08/19/2021 0522   AST 21 08/13/2021 0942   ALT 18 08/19/2021 0522   ALT 18 08/13/2021 0942   BILITOT 0.7 08/19/2021 0522   BILITOT 0.6 08/13/2021 0942       RADIOGRAPHIC STUDIES: CT Angio Chest PE W/Cm &/Or Wo Cm  Result Date: 08/18/2021 CLINICAL DATA:  Pulmonary embolism (PE) suspected, positive D-dimer. Today while going up and down stairs in his house he developed dyspnea each time. This is new. He has a pulse ox at home and he checked and his heart rate was in the 170s and his oxygen was in the 80s. EXAM: CT ANGIOGRAPHY CHEST WITH CONTRAST TECHNIQUE: Multidetector CT imaging of the chest was performed using the standard protocol during bolus administration of intravenous contrast. Multiplanar CT image reconstructions and MIPs were obtained to evaluate the vascular anatomy. CONTRAST:  78m OMNIPAQUE IOHEXOL 350 MG/ML SOLN COMPARISON:  None. FINDINGS: Cardiovascular: Satisfactory opacification of the pulmonary arteries to the segmental level. Limited evaluation of the subsegmental level due to motion artifact. No evidence of pulmonary embolism. The main pulmonary artery is normal in caliber. Normal heart size. No significant pericardial effusion. The thoracic aorta is normal in caliber. At least mild atherosclerotic plaque of the thoracic aorta. Four-vessel coronary artery calcifications status post coronary artery bypass graft. Mediastinum/Nodes: No enlarged mediastinal,  hilar, or axillary lymph nodes. Thyroid gland, trachea, and esophagus demonstrate no significant findings. Lungs/Pleura: Interval development of peribronchovascular ground-glass airspace opacities within the left lower lobe. Interval decrease in persistent right lower lobe peribronchovascular ground-glass airspace opacities. Interval increase in site of a left upper lobe 1.1 x 0.8 cm pulmonary nodule (from 0.6 cm). No pulmonary mass. Trace right pleural effusion. No left pleural effusion. No pneumothorax. Upper Abdomen: Splenule noted. Partially visualized fluid density lesion within the left kidney. No acute abnormality. Musculoskeletal: Interval development of soft tissue densities within the right chest wall measuring 1 x 0.8 cm and 1.6 x 1.7 cm (5: 246-252). Interval increase in size of lytic lesions along the posterior cortex of the right eleventh rib ((8:182-993 with a nondisplaced pathologic fracture through the rib not excluded (5:255). Interval development of a couple of lytic lesion within the right ninth rib: Along the anterior cortex (5:191) in the posterior cortex through the anterior cortex (5:225). Old healed right eighth rib fracture anterolaterally. Multilevel degenerative changes of the spine. Anterior and interbody C7-T1 fusion with surgical hardware. Review of the MIP images confirms the above findings. IMPRESSION: 1. Interval increase in size and interval development of a new right rib lytic lesions concerning for osseous metastases. A pathologic nondisplaced fracture of the right eleventh rib is not excluded. 2. Interval development of soft tissue densities within the right chest wall measuring 1 x 0.8 cm and 1.6 x 1.7 cm. These are concerning for metastases given right chest wall hypermetabolic activity on PET CT 07/03/2021. 3. Interval increase in site of a left upper lobe 1.1 x 0.8 cm pulmonary nodule (from 0.6 cm). 4. Interval development of peribronchovascular ground-glass airspace  opacities within the left lower lobe. Interval decrease in persistent right lower lobe peribronchovascular ground-glass airspace opacities. Findings suggestive of infection/inflammation. Underlying malignancy not excluded. 5. Trace right pleural effusion. Electronically Signed   By: MIven FinnM.D.   On: 08/18/2021 20:02  MR Brain W Wo Contrast  Result Date: 08/15/2021 CLINICAL DATA:  Follow-up S RS treated brain mass. Metastatic lung cancer. EXAM: MRI HEAD WITHOUT AND WITH CONTRAST TECHNIQUE: Multiplanar, multiecho pulse sequences of the brain and surrounding structures were obtained without and with intravenous contrast. CONTRAST:  54m MULTIHANCE GADOBENATE DIMEGLUMINE 529 MG/ML IV SOLN COMPARISON:  05/15/2021.  02/04/2021. FINDINGS: Brain: Diffusion imaging does not show any acute or subacute infarction or other cause of restricted diffusion. No abnormality affects the brainstem. Focus of hemosiderin deposition with punctate enhancement in the left cerebellum is unchanged since the prior study, consistent with an appearance suggesting successful treatment of the metastasis seen in June of this year. Punctate metastasis previously seen at the left frontoparietal vertex is no longer visible, as on the immediate prior exam. 7 mm in diameter new enhancing metastasis at left frontal vertex, axial image 132. 5 mm in diameter new enhancing metastasis in the left parietal lobe axial image 120. 6 mm in diameter new enhancing metastasis at the right posterior frontal vertex axial image 133. 3-4 mm in diameter new enhancing metastasis along the surface of the brain in the deep insula on the right axial image 72. No hydrocephalus or extra-axial collection. Vascular: Major vessels at the base of the brain show flow. Skull and upper cervical spine: Negative Sinuses/Orbits: Small chronic cyst at the superolateral corner of the left maxillary sinus as seen previously orbits negative. Other: None IMPRESSION: 4 new  metastases identified as enumerated above, in the left frontal vertex, left parietal lobe, right posterior frontal vertex and right deep insula. Continued good appearance of the previously treated left cerebellar lesion. No visible residua with respect to a punctate infarction previously seen at the left frontoparietal vertex. Electronically Signed   By: MNelson ChimesM.D.   On: 08/15/2021 16:03   DG Chest Port 1 View  Result Date: 08/29/2021 CLINICAL DATA:  Chest pain. EXAM: PORTABLE CHEST 1 VIEW COMPARISON:  Chest x-ray 08/18/2021. FINDINGS: Cardiomediastinal silhouette is within normal limits. Patient is status post cardiac surgery. Right chest port catheter tip projects over the distal SVC. There is no focal lung infiltrate, pleural effusion or pneumothorax. Cervical spinal fusion plate is present. No acute fractures are seen. IMPRESSION: No acute cardiopulmonary process. Electronically Signed   By: ARonney AstersM.D.   On: 08/29/2021 22:06   DG Chest Port 1 View  Result Date: 08/18/2021 CLINICAL DATA:  Sepsis EXAM: PORTABLE CHEST 1 VIEW COMPARISON:  Chest x-ray 07/29/2021 FINDINGS: Heart size is normal. Mediastinum appears stable. Calcified plaques in the aortic arch. Cardiac surgical changes and median sternotomy wires. Right-sided central venous port with the tip near the cavoatrial junction. Elevated right hemidiaphragm with chronic stable blunting of the right costophrenic angle. No new consolidation identified. No pleural effusion or pneumothorax visualized. IMPRESSION: Stable chronic changes with no acute process identified. Electronically Signed   By: DOfilia NeasM.D.   On: 08/18/2021 13:13    ASSESSMENT AND PLAN: This is a very pleasant 66years old white male recently diagnosed with a stage IV (T1c, N1, M1 B) large cell neuroendocrine carcinoma presented with right upper lobe lung nodule in addition to right hilar adenopathy in April 2022 and the patient was found to have solitary  brain metastasis in May 2022 He is status post right upper lobectomy with lymph node sampling under the care of Dr. LKipp Brood The patient underwent systemic chemotherapy with cisplatin 80 mg/M2 on day 1 and etoposide 100 Mg/M2 on days 1, 2 and  3 every 3 weeks.  He is status post 4 cycles.  He has a rough time tolerating his adjuvant systemic chemotherapy and it was discontinued after cycle #4. He is currently on observation but continues to have pain in the right side of the chest.  He was seen by Dr. Kipp Brood and underwent resection of the chest wall nodule and the final pathology was consistent with local recurrence of his disease and the right chest wall at the site of one of the chest wall tube. The patient was also found on recent MRI of the brain to have 4 new small brain metastasis that were treated with SRS. He is currently undergoing palliative radiotherapy to the local disease recurrence in the right side of the chest wall likely from implants after his surgical resection. His most recent CT scan showed also new left pulmonary nodule suspicious for metastatic disease to the left lung. I had a lengthy discussion with the patient and his son today about his prognosis with and without treatment as well as the possible treatment options. I recommend for the patient to continue with the palliative radiotherapy for now. He will have an appointment with me in early February 2023 for discussion of additional systemic chemotherapy. I would likely consider The patient for treatment with carboplatin, Alimta and Keytruda as initial systemic chemotherapy followed by maintenance Alimta and Keytruda.  The patient has a rough time with his previous treatment with the small cell regimen in the form of cisplatin and etoposide with frequent hospitalization and pancytopenia. Unfortunately has no actionable mutation and he is not a candidate for any targeted therapy. Other treatment options would be second line  therapy with docetaxel and Cyramza. I also discussed with the patient the option of palliative care and hospice but he is not interested in this option at this point. He also asked the question about his second opinion at [facility] and I will arrange for him to see Dr. Durenda Hurt for evaluation and recommendation regarding his condition and to see if there is any available clinical trial for his condition. For the pain management, the patient will continue his current treatment with MS Contin, MS IR and gabapentin.  He is followed by the palliative care clinic. The patient was advised to call immediately if he has any other concerning symptoms in the interval.  The patient voices understanding of current disease status and treatment options and is in agreement with the current care plan. All questions were answered. The patient knows to call the clinic with any problems, questions or concerns. We can certainly see the patient much sooner if necessary. The total time spent in the appointment was 55 minutes.   Disclaimer: This note was dictated with voice recognition software. Similar sounding words can inadvertently be transcribed and may not be corrected upon review.

## 2021-09-10 ENCOUNTER — Ambulatory Visit
Admission: RE | Admit: 2021-09-10 | Discharge: 2021-09-10 | Disposition: A | Discharge status: Home / Self Care | Payer: Medicare Other | Source: Ambulatory Visit | Attending: Radiation Oncology | Admitting: Radiation Oncology

## 2021-09-10 ENCOUNTER — Other Ambulatory Visit (HOSPITAL_COMMUNITY): Payer: Self-pay

## 2021-09-10 ENCOUNTER — Encounter: Payer: Medicare Other | Admitting: Nurse Practitioner

## 2021-09-10 DIAGNOSIS — R911 Solitary pulmonary nodule: Secondary | ICD-10-CM | POA: Diagnosis not present

## 2021-09-11 ENCOUNTER — Other Ambulatory Visit: Payer: Self-pay

## 2021-09-11 ENCOUNTER — Ambulatory Visit
Admission: RE | Admit: 2021-09-11 | Discharge: 2021-09-11 | Disposition: A | Discharge status: Home / Self Care | Payer: Medicare Other | Source: Ambulatory Visit | Attending: Radiation Oncology | Admitting: Radiation Oncology

## 2021-09-11 DIAGNOSIS — R911 Solitary pulmonary nodule: Secondary | ICD-10-CM | POA: Diagnosis not present

## 2021-09-14 ENCOUNTER — Ambulatory Visit
Admission: RE | Admit: 2021-09-14 | Discharge: 2021-09-14 | Disposition: A | Discharge status: Home / Self Care | Payer: Medicare Other | Source: Ambulatory Visit | Attending: Radiation Oncology | Admitting: Radiation Oncology

## 2021-09-14 ENCOUNTER — Encounter: Payer: Self-pay | Admitting: Internal Medicine

## 2021-09-14 ENCOUNTER — Inpatient Hospital Stay (HOSPITAL_BASED_OUTPATIENT_CLINIC_OR_DEPARTMENT_OTHER)
Admission: EM | Admit: 2021-09-14 | Discharge: 2021-09-20 | DRG: 948 | Disposition: A | Discharge status: Home / Self Care | Payer: Medicare Other | Attending: Internal Medicine | Admitting: Internal Medicine

## 2021-09-14 ENCOUNTER — Emergency Department (HOSPITAL_BASED_OUTPATIENT_CLINIC_OR_DEPARTMENT_OTHER): Payer: Medicare Other

## 2021-09-14 ENCOUNTER — Encounter (HOSPITAL_BASED_OUTPATIENT_CLINIC_OR_DEPARTMENT_OTHER): Payer: Self-pay

## 2021-09-14 ENCOUNTER — Other Ambulatory Visit: Payer: Self-pay

## 2021-09-14 DIAGNOSIS — G893 Neoplasm related pain (acute) (chronic): Principal | ICD-10-CM | POA: Diagnosis present

## 2021-09-14 DIAGNOSIS — Z87891 Personal history of nicotine dependence: Secondary | ICD-10-CM

## 2021-09-14 DIAGNOSIS — C799 Secondary malignant neoplasm of unspecified site: Secondary | ICD-10-CM

## 2021-09-14 DIAGNOSIS — I2510 Atherosclerotic heart disease of native coronary artery without angina pectoris: Secondary | ICD-10-CM | POA: Diagnosis present

## 2021-09-14 DIAGNOSIS — M8448XA Pathological fracture, other site, initial encounter for fracture: Secondary | ICD-10-CM | POA: Diagnosis present

## 2021-09-14 DIAGNOSIS — Z85828 Personal history of other malignant neoplasm of skin: Secondary | ICD-10-CM

## 2021-09-14 DIAGNOSIS — I252 Old myocardial infarction: Secondary | ICD-10-CM

## 2021-09-14 DIAGNOSIS — Z8673 Personal history of transient ischemic attack (TIA), and cerebral infarction without residual deficits: Secondary | ICD-10-CM

## 2021-09-14 DIAGNOSIS — C801 Malignant (primary) neoplasm, unspecified: Secondary | ICD-10-CM | POA: Diagnosis not present

## 2021-09-14 DIAGNOSIS — Z951 Presence of aortocoronary bypass graft: Secondary | ICD-10-CM

## 2021-09-14 DIAGNOSIS — Z8249 Family history of ischemic heart disease and other diseases of the circulatory system: Secondary | ICD-10-CM

## 2021-09-14 DIAGNOSIS — R079 Chest pain, unspecified: Secondary | ICD-10-CM

## 2021-09-14 DIAGNOSIS — Z902 Acquired absence of lung [part of]: Secondary | ICD-10-CM

## 2021-09-14 DIAGNOSIS — I10 Essential (primary) hypertension: Secondary | ICD-10-CM | POA: Diagnosis present

## 2021-09-14 DIAGNOSIS — Z79899 Other long term (current) drug therapy: Secondary | ICD-10-CM

## 2021-09-14 DIAGNOSIS — Z7982 Long term (current) use of aspirin: Secondary | ICD-10-CM

## 2021-09-14 DIAGNOSIS — S2239XA Fracture of one rib, unspecified side, initial encounter for closed fracture: Secondary | ICD-10-CM

## 2021-09-14 DIAGNOSIS — Z95828 Presence of other vascular implants and grafts: Secondary | ICD-10-CM

## 2021-09-14 DIAGNOSIS — C3491 Malignant neoplasm of unspecified part of right bronchus or lung: Secondary | ICD-10-CM

## 2021-09-14 DIAGNOSIS — C3490 Malignant neoplasm of unspecified part of unspecified bronchus or lung: Secondary | ICD-10-CM | POA: Diagnosis present

## 2021-09-14 DIAGNOSIS — F32A Depression, unspecified: Secondary | ICD-10-CM | POA: Diagnosis present

## 2021-09-14 DIAGNOSIS — Z823 Family history of stroke: Secondary | ICD-10-CM

## 2021-09-14 DIAGNOSIS — C7951 Secondary malignant neoplasm of bone: Secondary | ICD-10-CM | POA: Diagnosis present

## 2021-09-14 DIAGNOSIS — Z981 Arthrodesis status: Secondary | ICD-10-CM

## 2021-09-14 DIAGNOSIS — C7A1 Malignant poorly differentiated neuroendocrine tumors: Secondary | ICD-10-CM | POA: Diagnosis present

## 2021-09-14 DIAGNOSIS — Z86711 Personal history of pulmonary embolism: Secondary | ICD-10-CM

## 2021-09-14 DIAGNOSIS — Z801 Family history of malignant neoplasm of trachea, bronchus and lung: Secondary | ICD-10-CM

## 2021-09-14 DIAGNOSIS — Z7189 Other specified counseling: Secondary | ICD-10-CM

## 2021-09-14 DIAGNOSIS — Z87442 Personal history of urinary calculi: Secondary | ICD-10-CM

## 2021-09-14 DIAGNOSIS — Z6825 Body mass index (BMI) 25.0-25.9, adult: Secondary | ICD-10-CM

## 2021-09-14 DIAGNOSIS — C7931 Secondary malignant neoplasm of brain: Secondary | ICD-10-CM | POA: Diagnosis present

## 2021-09-14 DIAGNOSIS — Z9221 Personal history of antineoplastic chemotherapy: Secondary | ICD-10-CM

## 2021-09-14 DIAGNOSIS — Z7901 Long term (current) use of anticoagulants: Secondary | ICD-10-CM

## 2021-09-14 DIAGNOSIS — Z20822 Contact with and (suspected) exposure to covid-19: Secondary | ICD-10-CM | POA: Diagnosis present

## 2021-09-14 DIAGNOSIS — Z923 Personal history of irradiation: Secondary | ICD-10-CM

## 2021-09-14 DIAGNOSIS — E7800 Pure hypercholesterolemia, unspecified: Secondary | ICD-10-CM | POA: Diagnosis present

## 2021-09-14 DIAGNOSIS — Z79891 Long term (current) use of opiate analgesic: Secondary | ICD-10-CM

## 2021-09-14 DIAGNOSIS — K5900 Constipation, unspecified: Secondary | ICD-10-CM | POA: Diagnosis present

## 2021-09-14 DIAGNOSIS — M797 Fibromyalgia: Secondary | ICD-10-CM | POA: Diagnosis present

## 2021-09-14 DIAGNOSIS — R52 Pain, unspecified: Secondary | ICD-10-CM

## 2021-09-14 DIAGNOSIS — I255 Ischemic cardiomyopathy: Secondary | ICD-10-CM | POA: Diagnosis present

## 2021-09-14 DIAGNOSIS — E785 Hyperlipidemia, unspecified: Secondary | ICD-10-CM | POA: Diagnosis present

## 2021-09-14 DIAGNOSIS — E440 Moderate protein-calorie malnutrition: Secondary | ICD-10-CM | POA: Diagnosis present

## 2021-09-14 LAB — CBC WITH DIFFERENTIAL/PLATELET
Abs Immature Granulocytes: 0.03 10*3/uL (ref 0.00–0.07)
Basophils Absolute: 0 10*3/uL (ref 0.0–0.1)
Basophils Relative: 0 %
Eosinophils Absolute: 0.2 10*3/uL (ref 0.0–0.5)
Eosinophils Relative: 4 %
HCT: 35.6 % — ABNORMAL LOW (ref 39.0–52.0)
Hemoglobin: 11.5 g/dL — ABNORMAL LOW (ref 13.0–17.0)
Immature Granulocytes: 1 %
Lymphocytes Relative: 12 %
Lymphs Abs: 0.8 10*3/uL (ref 0.7–4.0)
MCH: 29.8 pg (ref 26.0–34.0)
MCHC: 32.3 g/dL (ref 30.0–36.0)
MCV: 92.2 fL (ref 80.0–100.0)
Monocytes Absolute: 0.8 10*3/uL (ref 0.1–1.0)
Monocytes Relative: 12 %
Neutro Abs: 4.5 10*3/uL (ref 1.7–7.7)
Neutrophils Relative %: 71 %
Platelets: 227 10*3/uL (ref 150–400)
RBC: 3.86 MIL/uL — ABNORMAL LOW (ref 4.22–5.81)
RDW: 13.9 % (ref 11.5–15.5)
WBC: 6.3 10*3/uL (ref 4.0–10.5)
nRBC: 0 % (ref 0.0–0.2)

## 2021-09-14 MED ORDER — MORPHINE SULFATE (PF) 4 MG/ML IV SOLN
8.0000 mg | Freq: Once | INTRAVENOUS | Status: AC
Start: 2021-09-14 — End: 2021-09-14
  Administered 2021-09-14: 8 mg via INTRAVENOUS
  Filled 2021-09-14: qty 2

## 2021-09-14 MED ORDER — HYDROMORPHONE HCL 1 MG/ML IJ SOLN
0.5000 mg | Freq: Once | INTRAMUSCULAR | Status: AC
  Administered 2021-09-15: 0.5 mg via INTRAVENOUS
  Filled 2021-09-14: qty 1

## 2021-09-14 MED ORDER — ACETAMINOPHEN 325 MG PO TABS
650.0000 mg | ORAL_TABLET | Freq: Once | ORAL | Status: AC
Start: 2021-09-14 — End: 2021-09-14
  Administered 2021-09-14: 650 mg via ORAL
  Filled 2021-09-14: qty 2

## 2021-09-14 NOTE — ED Provider Notes (Signed)
[facility] Provider Note   CSN: [id number] Arrival date & time: 09/14/21  2209     History  Chief Complaint  Patient presents with   Abdominal Pain    [name] is a 66 y.o. male.   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  He states he is taking all of his medications as prescribed he states that he has had gradually worsening right side posterior rib cage and right sided abdominal pain that has progressively worsened over the course of the day.  He states that he normally has some degree of rib and right-sided flank pain but states it is never this bad.  He denies any hemoptysis states that he is taking all of his medications as prescribed.  He states the pain is 9/10 constant severe sharp and achy.       Home Medications Prior to Admission medications   Medication Sig Start Date End Date Taking? Authorizing Provider  acetaminophen (TYLENOL) 500 MG tablet Take 1,000 mg by mouth every 6 (six) hours as needed for mild pain, fever or headache.    [provider]  aspirin EC 81 MG tablet Take 81 mg by mouth daily. Swallow whole.    [provider]  atorvastatin (LIPITOR) 80 MG tablet TAKE 1 TABLET(80 MG) BY MOUTH DAILY Patient taking differently: Take 80 mg by mouth daily. 04/01/21   [name], MD  docusate sodium (COLACE) 100 MG capsule Take 100-200 mg by mouth daily.    [provider]  DULoxetine 40 MG CPEP Take 40 mg by mouth every evening. 08/01/21   Gold, Patrick Jupiter E, PA-C  gabapentin (NEURONTIN) 100 MG capsule Take 1 capsule by mouth twice a  day and 3 capsules at bedtime. 09/09/21   Pickenpack-Cousar, Carlena Sax, NP  lidocaine (LIDODERM) 5 % Place 1 patch onto the skin daily. Remove & Discard patch within 12 hours or as directed by MD Patient taking differently: Place 1 patch onto the skin daily as needed (pain). Remove & Discard patch within 12 hours or as directed by MD 06/26/21   Quintella Reichert, MD  metoprolol succinate (TOPROL-XL) 25 MG 24 hr tablet Take 1 tablet (25 mg total) by mouth daily. Patient taking differently: Take 25 mg by mouth every evening. 08/27/20   [name], MD  midodrine (PROAMATINE) 2.5 MG tablet Take 1 tablet (2.5 mg total) by mouth 3 (three) times daily with meals. Patient taking differently: Take 2.5 mg by mouth 3 (three) times daily as needed (Hypotension). 06/15/21 12/12/21  Loel Dubonnet, NP  morphine (MS CONTIN) 30 MG 12 hr tablet Take 1 tablet by mouth every 8 hours. 09/02/21   Pickenpack-Cousar, Carlena Sax, NP  morphine (MSIR) 15 MG tablet Take 1-2 tablets by mouth with food every 4 hours as needed for moderate to severe pain. 09/10/21   Pickenpack-Cousar, Carlena Sax, NP  Multiple Vitamin (MULTIVITAMIN WITH MINERALS) TABS tablet Take 1 tablet by mouth daily.    [provider]  ondansetron (ZOFRAN) 8 MG tablet Take 1 tablet (8 mg total) by mouth every 8 (eight) hours as  needed for nausea or vomiting. Starting 3 days after chemotherapy 08/27/21   Pickenpack-Cousar, Carlena Sax, NP  polyethylene glycol (MIRALAX / GLYCOLAX) 17 g packet Take 17 g by mouth daily. 09/04/21   Mariel Aloe, MD  ranolazine (RANEXA) 1000 MG SR tablet Take 1 tablet (1,000 mg total) by mouth 2 (two) times daily. 05/25/21   [name], MD  tiZANidine (ZANAFLEX) 2 MG tablet Take 1 tablet (2 mg total) by mouth at bedtime as needed for muscle spasms. 09/09/21   Pickenpack-Cousar, Carlena Sax, NP  XARELTO 20 MG TABS tablet Take 20 mg by mouth every evening. 06/01/21   [provider]      Allergies     Indomethacin, Prednisone, Tetanus toxoids, Wellbutrin [bupropion], Gabapentin, and Varenicline    Review of Systems   Review of Systems  Constitutional:  Negative for chills and fever.  HENT:  Negative for congestion.   Eyes:  Negative for pain.  Respiratory:  Negative for cough and shortness of breath.   Cardiovascular:  Negative for chest pain and leg swelling.       Right rib and right flank pain  Gastrointestinal:  Positive for abdominal pain. Negative for vomiting.  Genitourinary:  Negative for dysuria.  Musculoskeletal:  Negative for myalgias.  Skin:  Negative for rash.  Neurological:  Negative for dizziness and headaches.   Physical Exam Updated Vital Signs BP 122/75 (BP Location: Left Arm)    Pulse 98    Temp 98.7 F (37.1 C) (Oral)    Resp 15    Ht 5\' 9"  (1.753 m)    Wt 78.5 kg    SpO2 100%    BMI 25.55 kg/m  Physical Exam Vitals and nursing note reviewed.  Constitutional:      General: He is not in acute distress.    Comments: Chronically ill-appearing 66 year old gentleman does not appear acutely toxic but does appear uncomfortable.  Pleasant, able to answer questions appropriately follow commands  HENT:     Head: Normocephalic and atraumatic.     Nose: Nose normal.  Eyes:     General: No scleral icterus. Cardiovascular:     Rate and Rhythm: Regular rhythm. Tachycardia present.     Pulses: Normal pulses.     Heart sounds: Normal heart sounds.  Pulmonary:     Effort: Pulmonary effort is normal. No respiratory distress.     Breath sounds: No wheezing.     Comments: There is symptoms palpation of the right rib cage posteriorly and laterally Abdominal:     Palpations: Abdomen is soft.     Tenderness: There is no abdominal tenderness.     Comments: Abdomen is soft not particularly tender to palpation.  Musculoskeletal:     Cervical back: Normal range of motion.     Right lower leg: No edema.     Left lower leg: No edema.  Skin:    General: Skin is warm and dry.      Capillary Refill: Capillary refill takes less than 2 seconds.  Neurological:     Mental Status: He is alert. Mental status is at baseline.  Psychiatric:        Mood and Affect: Mood normal.        Behavior: Behavior normal.    ED Results / Procedures / Treatments   Labs (all labs ordered are listed, but only abnormal results are displayed) Labs Reviewed  CBC WITH DIFFERENTIAL/PLATELET - Abnormal; Notable for the following components:      Result  Value   RBC 3.86 (*)    Hemoglobin 11.5 (*)    HCT 35.6 (*)    All other components within normal limits  COMPREHENSIVE METABOLIC PANEL - Abnormal; Notable for the following components:   Glucose, Bld 100 (*)    All other components within normal limits  LIPASE, BLOOD  TROPONIN I (HIGH SENSITIVITY)    EKG None  Radiology DG Chest 2 View  Result Date: 09/14/2021 CLINICAL DATA:  Right lower abdominal pain with cough and nausea. EXAM: CHEST - 2 VIEW COMPARISON:  August 29, 2021 FINDINGS: There is stable right-sided venous Port-A-Cath positioning. Multiple sternal wires and vascular clips are seen. Mild atelectasis is noted within the right lung base. A small right pleural effusion is present. No pneumothorax is identified. The heart size and mediastinal contours are within normal limits. A radiopaque fusion plate and screws are seen overlying the lower cervical spine. The visualized skeletal structures are otherwise unremarkable. IMPRESSION: 1. Stable postoperative changes with mild right basilar atelectasis. 2. Small right pleural effusion. Electronically Signed   By: Virgina Norfolk M.D.   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  He states he is taking all of his medications as prescribed he states that he has had gradually worsening right side posterior rib cage and right sided abdominal pain that has progressively worsened over the course of the day.  He states that he normally has some degree of rib and right-sided flank pain but states it is never this bad.  He denies any hemoptysis states that he is taking all of his medications as prescribed.  He states the pain is 9/10 constant severe sharp and achy.  Patient has no palpable tenderness of the abdomen on examination although he does have some tenderness of the posterior thorax over the rib cage.  He is currently a palliative radiation patient.  Seems that he is mostly experiencing worsening of chronic pain that he has related to his cancer.  He is currently on treatment for PE in the form of Xarelto however we will go ahead and obtain PE study and CT abdomen pelvis with contrast.  Given morphine 8 mg for pain and reassess 30 minutes later without significant improvement in his pain.  We will provide patient with 0.5 Dilaudid.  He does take a significant amount of morphine at home p.o.  CBC without clinically significant anemia or leukocytosis.  CMP unremarkable.  Lipase within the limits.  Patient care handed off to Dr. Ezequiel Essex to follow-up on CT imaging disposition appropriately.  Final Clinical Impression(s) / ED Diagnoses Final diagnoses:  Cancer ([place])  Chest pain, unspecified type  Rx / DC Orders ED Discharge Orders      None         Tedd Sias, [place] 09/15/21 0005    Ezequiel Essex, MD 09/15/21 [phone]

## 2021-09-14 NOTE — ED Triage Notes (Signed)
Pt c/o right side abd/flank, groin pain x today-states he is a cancer pt-had radiation treatment today-no relief with baseline pain regimen-NAD-steady gait

## 2021-09-15 ENCOUNTER — Emergency Department (HOSPITAL_BASED_OUTPATIENT_CLINIC_OR_DEPARTMENT_OTHER): Payer: Medicare Other

## 2021-09-15 ENCOUNTER — Other Ambulatory Visit: Payer: Self-pay

## 2021-09-15 ENCOUNTER — Ambulatory Visit: Payer: Medicare Other

## 2021-09-15 DIAGNOSIS — G893 Neoplasm related pain (acute) (chronic): Secondary | ICD-10-CM | POA: Diagnosis not present

## 2021-09-15 DIAGNOSIS — C7A1 Malignant poorly differentiated neuroendocrine tumors: Secondary | ICD-10-CM | POA: Diagnosis not present

## 2021-09-15 DIAGNOSIS — Z87442 Personal history of urinary calculi: Secondary | ICD-10-CM | POA: Diagnosis not present

## 2021-09-15 DIAGNOSIS — I252 Old myocardial infarction: Secondary | ICD-10-CM | POA: Diagnosis not present

## 2021-09-15 DIAGNOSIS — Z87891 Personal history of nicotine dependence: Secondary | ICD-10-CM | POA: Diagnosis not present

## 2021-09-15 DIAGNOSIS — K5900 Constipation, unspecified: Secondary | ICD-10-CM | POA: Diagnosis present

## 2021-09-15 DIAGNOSIS — I10 Essential (primary) hypertension: Secondary | ICD-10-CM | POA: Diagnosis not present

## 2021-09-15 DIAGNOSIS — Z923 Personal history of irradiation: Secondary | ICD-10-CM | POA: Diagnosis not present

## 2021-09-15 DIAGNOSIS — Z85828 Personal history of other malignant neoplasm of skin: Secondary | ICD-10-CM | POA: Diagnosis not present

## 2021-09-15 DIAGNOSIS — I2510 Atherosclerotic heart disease of native coronary artery without angina pectoris: Secondary | ICD-10-CM | POA: Diagnosis not present

## 2021-09-15 DIAGNOSIS — Z8673 Personal history of transient ischemic attack (TIA), and cerebral infarction without residual deficits: Secondary | ICD-10-CM | POA: Diagnosis not present

## 2021-09-15 DIAGNOSIS — Z9221 Personal history of antineoplastic chemotherapy: Secondary | ICD-10-CM | POA: Diagnosis not present

## 2021-09-15 DIAGNOSIS — Z981 Arthrodesis status: Secondary | ICD-10-CM | POA: Diagnosis not present

## 2021-09-15 DIAGNOSIS — I255 Ischemic cardiomyopathy: Secondary | ICD-10-CM | POA: Diagnosis not present

## 2021-09-15 DIAGNOSIS — Z902 Acquired absence of lung [part of]: Secondary | ICD-10-CM | POA: Diagnosis not present

## 2021-09-15 DIAGNOSIS — Z86711 Personal history of pulmonary embolism: Secondary | ICD-10-CM | POA: Diagnosis not present

## 2021-09-15 DIAGNOSIS — M8448XA Pathological fracture, other site, initial encounter for fracture: Secondary | ICD-10-CM | POA: Diagnosis not present

## 2021-09-15 DIAGNOSIS — Z951 Presence of aortocoronary bypass graft: Secondary | ICD-10-CM | POA: Diagnosis not present

## 2021-09-15 DIAGNOSIS — C801 Malignant (primary) neoplasm, unspecified: Secondary | ICD-10-CM | POA: Diagnosis present

## 2021-09-15 DIAGNOSIS — F32A Depression, unspecified: Secondary | ICD-10-CM | POA: Diagnosis not present

## 2021-09-15 DIAGNOSIS — M797 Fibromyalgia: Secondary | ICD-10-CM | POA: Diagnosis not present

## 2021-09-15 DIAGNOSIS — C7951 Secondary malignant neoplasm of bone: Secondary | ICD-10-CM | POA: Diagnosis not present

## 2021-09-15 DIAGNOSIS — Z20822 Contact with and (suspected) exposure to covid-19: Secondary | ICD-10-CM | POA: Diagnosis not present

## 2021-09-15 DIAGNOSIS — E440 Moderate protein-calorie malnutrition: Secondary | ICD-10-CM | POA: Diagnosis not present

## 2021-09-15 DIAGNOSIS — C7931 Secondary malignant neoplasm of brain: Secondary | ICD-10-CM | POA: Diagnosis not present

## 2021-09-15 LAB — URINALYSIS, ROUTINE W REFLEX MICROSCOPIC
Bilirubin Urine: NEGATIVE
Glucose, UA: NEGATIVE mg/dL
Hgb urine dipstick: NEGATIVE
Ketones, ur: NEGATIVE mg/dL
Leukocytes,Ua: NEGATIVE
Nitrite: NEGATIVE
Protein, ur: NEGATIVE mg/dL
Specific Gravity, Urine: 1.015 (ref 1.005–1.030)
pH: 7.5 (ref 5.0–8.0)

## 2021-09-15 LAB — RESP PANEL BY RT-PCR (FLU A&B, COVID) ARPGX2
Influenza A by PCR: NEGATIVE
Influenza B by PCR: NEGATIVE
SARS Coronavirus 2 by RT PCR: NEGATIVE

## 2021-09-15 LAB — TROPONIN I (HIGH SENSITIVITY)
Troponin I (High Sensitivity): 3 ng/L (ref <18)
Troponin I (High Sensitivity): 4 ng/L (ref <18)

## 2021-09-15 LAB — COMPREHENSIVE METABOLIC PANEL
ALT: 10 U/L (ref 0–44)
AST: 18 U/L (ref 15–41)
Albumin: 3.6 g/dL (ref 3.5–5.0)
Alkaline Phosphatase: 76 U/L (ref 38–126)
Anion gap: 7 (ref 5–15)
BUN: 12 mg/dL (ref 8–23)
CO2: 29 mmol/L (ref 22–32)
Calcium: 10.3 mg/dL (ref 8.9–10.3)
Chloride: 103 mmol/L (ref 98–111)
Creatinine, Ser: 0.88 mg/dL (ref 0.61–1.24)
GFR, Estimated: >60 mL/min (ref >60)
Glucose, Bld: 100 mg/dL — ABNORMAL HIGH (ref 70–99)
Potassium: 4.5 mmol/L (ref 3.5–5.1)
Sodium: 139 mmol/L (ref 135–145)
Total Bilirubin: 0.5 mg/dL (ref 0.3–1.2)
Total Protein: 7.1 g/dL (ref 6.5–8.1)

## 2021-09-15 LAB — LIPASE, BLOOD: Lipase: 23 U/L (ref 11–51)

## 2021-09-15 MED ORDER — ATORVASTATIN CALCIUM 40 MG PO TABS
80.0000 mg | ORAL_TABLET | Freq: Every day | ORAL | Status: DC
  Administered 2021-09-15 – 2021-09-16 (×2): 80 mg via ORAL
  Filled 2021-09-15 (×2): qty 2

## 2021-09-15 MED ORDER — CHLORHEXIDINE GLUCONATE CLOTH 2 % EX PADS
6.0000 | MEDICATED_PAD | Freq: Every day | CUTANEOUS | Status: DC
  Administered 2021-09-16 – 2021-09-20 (×5): 6 via TOPICAL

## 2021-09-15 MED ORDER — LIDOCAINE 5 % EX PTCH
1.0000 | MEDICATED_PATCH | CUTANEOUS | Status: DC
  Administered 2021-09-15 – 2021-09-18 (×5): 1 via TRANSDERMAL
  Filled 2021-09-15 (×8): qty 1

## 2021-09-15 MED ORDER — MORPHINE SULFATE ER 30 MG PO TBCR
45.0000 mg | EXTENDED_RELEASE_TABLET | Freq: Three times a day (TID) | ORAL | Status: DC
  Administered 2021-09-15 – 2021-09-17 (×6): 45 mg via ORAL
  Filled 2021-09-15 (×6): qty 1

## 2021-09-15 MED ORDER — HYDROMORPHONE HCL 1 MG/ML IJ SOLN
1.0000 mg | Freq: Once | INTRAMUSCULAR | Status: AC
  Administered 2021-09-15: 1 mg via INTRAVENOUS
  Filled 2021-09-15: qty 1

## 2021-09-15 MED ORDER — DOCUSATE SODIUM 100 MG PO CAPS
100.0000 mg | ORAL_CAPSULE | Freq: Every day | ORAL | Status: DC
  Administered 2021-09-15 – 2021-09-16 (×2): 100 mg via ORAL
  Administered 2021-09-17: 200 mg via ORAL
  Administered 2021-09-18 – 2021-09-20 (×3): 100 mg via ORAL
  Filled 2021-09-15 (×2): qty 1
  Filled 2021-09-15: qty 2
  Filled 2021-09-15 (×3): qty 1

## 2021-09-15 MED ORDER — DULOXETINE HCL 20 MG PO CPEP
40.0000 mg | ORAL_CAPSULE | Freq: Every evening | ORAL | Status: DC
  Administered 2021-09-15 – 2021-09-19 (×5): 40 mg via ORAL
  Filled 2021-09-15 (×6): qty 2

## 2021-09-15 MED ORDER — RANOLAZINE ER 500 MG PO TB12
1000.0000 mg | ORAL_TABLET | Freq: Two times a day (BID) | ORAL | Status: DC
Start: 2021-09-15 — End: 2021-09-20
  Administered 2021-09-15 – 2021-09-20 (×10): 1000 mg via ORAL
  Filled 2021-09-15 (×10): qty 2

## 2021-09-15 MED ORDER — HYDROMORPHONE HCL 1 MG/ML IJ SOLN
1.0000 mg | INTRAMUSCULAR | Status: DC | PRN
  Administered 2021-09-15: 1 mg via INTRAVENOUS
  Administered 2021-09-16 (×3): 2 mg via INTRAVENOUS
  Administered 2021-09-16: 1 mg via INTRAVENOUS
  Administered 2021-09-16: 2 mg via INTRAVENOUS
  Administered 2021-09-16 (×2): 1 mg via INTRAVENOUS
  Administered 2021-09-16: 2 mg via INTRAVENOUS
  Administered 2021-09-17 (×4): 1 mg via INTRAVENOUS
  Administered 2021-09-17: 2 mg via INTRAVENOUS
  Administered 2021-09-17: 1 mg via INTRAVENOUS
  Filled 2021-09-15: qty 1
  Filled 2021-09-15 (×2): qty 2
  Filled 2021-09-15: qty 1
  Filled 2021-09-15: qty 2
  Filled 2021-09-15: qty 1
  Filled 2021-09-15: qty 2
  Filled 2021-09-15: qty 1
  Filled 2021-09-15 (×2): qty 2
  Filled 2021-09-15 (×3): qty 1
  Filled 2021-09-15 (×3): qty 2
  Filled 2021-09-15: qty 1

## 2021-09-15 MED ORDER — LORAZEPAM 1 MG PO TABS
1.0000 mg | ORAL_TABLET | Freq: Once | ORAL | Status: AC
  Administered 2021-09-15: 1 mg via ORAL
  Filled 2021-09-15: qty 1

## 2021-09-15 MED ORDER — METOPROLOL SUCCINATE ER 25 MG PO TB24
25.0000 mg | ORAL_TABLET | Freq: Every evening | ORAL | Status: DC
  Administered 2021-09-15 – 2021-09-19 (×5): 25 mg via ORAL
  Filled 2021-09-15 (×5): qty 1

## 2021-09-15 MED ORDER — POLYETHYLENE GLYCOL 3350 17 G PO PACK
17.0000 g | PACK | Freq: Every day | ORAL | Status: DC
  Administered 2021-09-16 – 2021-09-18 (×3): 17 g via ORAL
  Filled 2021-09-15 (×5): qty 1

## 2021-09-15 MED ORDER — GABAPENTIN 100 MG PO CAPS
100.0000 mg | ORAL_CAPSULE | Freq: Two times a day (BID) | ORAL | Status: DC
  Administered 2021-09-15 – 2021-09-16 (×4): 100 mg via ORAL
  Filled 2021-09-15 (×4): qty 1

## 2021-09-15 MED ORDER — ONDANSETRON HCL 4 MG/2ML IJ SOLN
4.0000 mg | Freq: Four times a day (QID) | INTRAMUSCULAR | Status: DC | PRN
  Administered 2021-09-16 – 2021-09-18 (×4): 4 mg via INTRAVENOUS
  Filled 2021-09-15 (×4): qty 2

## 2021-09-15 MED ORDER — SODIUM CHLORIDE 0.9% FLUSH
10.0000 mL | Freq: Two times a day (BID) | INTRAVENOUS | Status: DC
  Administered 2021-09-15 – 2021-09-20 (×10): 10 mL

## 2021-09-15 MED ORDER — ASPIRIN EC 81 MG PO TBEC
81.0000 mg | DELAYED_RELEASE_TABLET | Freq: Every day | ORAL | Status: DC
  Administered 2021-09-15 – 2021-09-20 (×6): 81 mg via ORAL
  Filled 2021-09-15 (×6): qty 1

## 2021-09-15 MED ORDER — IOHEXOL 350 MG/ML SOLN
100.0000 mL | Freq: Once | INTRAVENOUS | Status: AC | PRN
  Administered 2021-09-15: 100 mL via INTRAVENOUS

## 2021-09-15 MED ORDER — SENNOSIDES-DOCUSATE SODIUM 8.6-50 MG PO TABS
1.0000 | ORAL_TABLET | Freq: Two times a day (BID) | ORAL | Status: DC
  Administered 2021-09-15 – 2021-09-19 (×9): 1 via ORAL
  Filled 2021-09-15 (×10): qty 1

## 2021-09-15 MED ORDER — GABAPENTIN 300 MG PO CAPS
300.0000 mg | ORAL_CAPSULE | Freq: Every day | ORAL | Status: DC
  Administered 2021-09-15 – 2021-09-19 (×6): 300 mg via ORAL
  Filled 2021-09-15 (×6): qty 1

## 2021-09-15 MED ORDER — ONDANSETRON HCL 4 MG PO TABS
4.0000 mg | ORAL_TABLET | Freq: Four times a day (QID) | ORAL | Status: DC | PRN
  Administered 2021-09-16 – 2021-09-17 (×2): 4 mg via ORAL
  Filled 2021-09-15 (×2): qty 1

## 2021-09-15 MED ORDER — TIZANIDINE HCL 4 MG PO TABS
2.0000 mg | ORAL_TABLET | Freq: Every evening | ORAL | Status: DC | PRN
  Administered 2021-09-15 – 2021-09-19 (×3): 2 mg via ORAL
  Filled 2021-09-15 (×4): qty 1

## 2021-09-15 MED ORDER — RIVAROXABAN 20 MG PO TABS
20.0000 mg | ORAL_TABLET | Freq: Every evening | ORAL | Status: DC
  Administered 2021-09-15 – 2021-09-19 (×5): 20 mg via ORAL
  Filled 2021-09-15 (×5): qty 1

## 2021-09-15 MED ORDER — LORAZEPAM 1 MG PO TABS
0.5000 mg | ORAL_TABLET | Freq: Once | ORAL | Status: DC
  Filled 2021-09-15: qty 1

## 2021-09-15 MED ORDER — HYDROMORPHONE HCL 1 MG/ML IJ SOLN
1.0000 mg | INTRAMUSCULAR | Status: DC | PRN
Start: 2021-09-15 — End: 2021-09-15
  Administered 2021-09-15 (×4): 1 mg via INTRAVENOUS
  Filled 2021-09-15 (×4): qty 1

## 2021-09-15 MED ORDER — MORPHINE SULFATE 15 MG PO TABS
15.0000 mg | ORAL_TABLET | ORAL | Status: DC | PRN
  Administered 2021-09-15: 15 mg via ORAL
  Filled 2021-09-15 (×2): qty 1

## 2021-09-15 MED ORDER — MORPHINE SULFATE 15 MG PO TABS
30.0000 mg | ORAL_TABLET | ORAL | Status: DC | PRN
  Administered 2021-09-15 – 2021-09-17 (×8): 30 mg via ORAL
  Filled 2021-09-15 (×8): qty 2

## 2021-09-15 MED ORDER — ONDANSETRON HCL 4 MG/2ML IJ SOLN
4.0000 mg | Freq: Once | INTRAMUSCULAR | Status: AC
Start: 2021-09-15 — End: 2021-09-15
  Administered 2021-09-15: 4 mg via INTRAVENOUS
  Filled 2021-09-15: qty 2

## 2021-09-15 MED ORDER — MORPHINE SULFATE ER 30 MG PO TBCR
30.0000 mg | EXTENDED_RELEASE_TABLET | Freq: Three times a day (TID) | ORAL | Status: DC
  Filled 2021-09-15: qty 1

## 2021-09-15 NOTE — Progress Notes (Addendum)
YE[facility] MEWS NOTE   New admission from [facility]. No obvious distress. HR elevated, very low grade temp. ST on tele. On RA.  Pt complaining of 8/10 right chest wall pain. Medicated immediately with PRN MSIR.  MD notified of pt arrival. Await orders.  Unit MEWS protocols started, Charge RN aware.  RN will continue to monitor.    09/15/21 1619  Assess: MEWS Score  Temp 99.2 F (37.3 C)  BP 108/86  Pulse Rate (!) 125  Resp 18  Level of Consciousness Alert  SpO2 100 %  O2 Device Room Air  Assess: MEWS Score  MEWS Temp 0  MEWS Systolic 0  MEWS Pulse 2  MEWS RR 0  MEWS LOC 0  MEWS Score 2  MEWS Score Color Yellow  Assess: if the MEWS score is Yellow or Red  Were vital signs taken at a resting state? Yes  Focused Assessment No change from prior assessment  Does the patient meet 2 or more of the SIRS criteria? No  MEWS guidelines implemented *See Row Information* Yes  Treat  MEWS Interventions Administered prn meds/treatments  Pain Scale 0-10  Pain Score 8  Pain Type Acute pain;Chronic pain  Pain Location Chest  Pain Orientation Right;Mid  Pain Descriptors / Indicators Aching;Burning  Pain Frequency Constant  Pain Onset On-going  Patients Stated Pain Goal 4  Take Vital Signs  Increase Vital Sign Frequency  Yellow: Q 2hr X 2 then Q 4hr X 2, if remains yellow, continue Q 4hrs  Escalate  MEWS: Escalate Yellow: discuss with charge nurse/RN and consider discussing with provider and RRT  Notify: Charge Nurse/RN  Name of Charge Nurse/RN Notified Wayna Chalet  Date Charge Nurse/RN Notified 09/15/21  Time Charge Nurse/RN Notified 1703  Notify: Provider  Provider Name/Title Patel  Date Provider Notified 09/15/21  Time Provider Notified 1704  Notification Type  (Secure Chat)  Document  Patient Outcome Other (Comment) (Continue to monitor, remain on current unit)  Progress note created (see row info) Yes  Assess: SIRS CRITERIA  SIRS Temperature  0  SIRS Pulse 1   SIRS Respirations  0  SIRS WBC 0  SIRS Score Sum  1

## 2021-09-15 NOTE — ED Provider Notes (Signed)
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  No fever or cough.  No significant shortness of breath  Tenderness to right lower ribs and flank with soft tissue masses present. Abdomen soft and nontender  Imaging shows no pulmonary embolism but does show progression of metastatic disease involving both lungs.  Results discussed with patient and wife.  Significant progression of disease compared to his CT scan December 28.  Soft tissue masses to right lower chest wall is enlarged as well as multiple lung nodules that are new bilaterally.  Patient still with severe pain and is requesting overnight admission for pain control and oncology evaluation in the morning. This seems reasonable.  His labs are at baseline.  No hypoxia or increased work of breathing.  Admission for intractable pain as well as progression of his metastatic disease discussed with Dr. Cyd Silence. Oncology to consult in the morning.    Ezequiel Essex, MD 09/15/21 [phone]

## 2021-09-15 NOTE — Plan of Care (Signed)
New admission 1511- intractable pain.  Medications and pain management per orders.  Nonpharmacologic interventions in place.  Fall risk precautions.   Problem: Education: Goal: Knowledge of General Education information will improve Description: Including pain rating scale, medication(s)/side effects and non-pharmacologic comfort measures Outcome: Progressing   Problem: Health Behavior/Discharge Planning: Goal: Ability to manage health-related needs will improve Outcome: Progressing   Problem: Clinical Measurements: Goal: Ability to maintain clinical measurements within normal limits will improve Outcome: Progressing Goal: Will remain free from infection Outcome: Progressing Goal: Diagnostic test results will improve Outcome: Progressing Goal: Respiratory complications will improve Outcome: Progressing Goal: Cardiovascular complication will be avoided Outcome: Progressing   Problem: Activity: Goal: Risk for activity intolerance will decrease Outcome: Progressing   Problem: Nutrition: Goal: Adequate nutrition will be maintained Outcome: Progressing   Problem: Coping: Goal: Level of anxiety will decrease Outcome: Progressing   Problem: Elimination: Goal: Will not experience complications related to bowel motility Outcome: Progressing Goal: Will not experience complications related to urinary retention Outcome: Progressing   Problem: Pain Managment: Goal: General experience of comfort will improve Outcome: Progressing   Problem: Safety: Goal: Ability to remain free from injury will improve Outcome: Progressing   Problem: Skin Integrity: Goal: Risk for impaired skin integrity will decrease Outcome: Progressing   Problem: Bowel/Gastric: Goal: Will not experience complications related to bowel motility Outcome: Progressing   Problem: Coping: Goal: Ability to identify and develop effective coping behavior will improve Outcome: Progressing

## 2021-09-15 NOTE — Assessment & Plan Note (Addendum)
-   Fibromyalgia. Associated with metastatic lung cancer as well as neuroendocrine carcinoma. - Currently undergoing radiation therapy for the same. To be completed around 09/23/21 - palliative care managing pain regimen; discharged with MS Contin at increased dose and dilaudid PO

## 2021-09-15 NOTE — Assessment & Plan Note (Addendum)
-   patient encouraged to continue laxative at discharge while on chronic opioids

## 2021-09-15 NOTE — Progress Notes (Signed)
Plan of Care Note for accepted transfer   Patient: [name] MRN: [id number]   [place]: 09/14/2021  Facility requesting transfer: [facility] Requesting Provider: Dr. 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  Of note, patient was recently hospitalized  12/31-1/5 at [facility] due to intractable right chest wall pain.  Upon evaluation in the emergency department CT imaging of the chest abdomen and pelvis were performed revealing some interval increase in size of the left upper lobe lung nodule with new bilateral noncalcified lung nodules consistent with ongoing pulmonary metastasis as well as interval enlargement of soft tissue mass seen in the posterior right chest wall with pleural invasion which is likely the culprit of the patient's symptoms.  Patient is received 2 doses of morphine and 2 doses of Dilaudid and 1 dose of Ativan with ongoing symptoms.  ER provider requesting hospitalization for management of intractable pain.  Plan of care: The patient is accepted for admission to Telemetry unit, at [facility]..    Author: Vernelle Emerald, MD 09/15/2021  Check [email or web address] for on-call coverage.  Nursing staff, Please call [facility] number on Amion as soon as patient's arrival, so appropriate admitting provider can evaluate the pt.

## 2021-09-15 NOTE — ED Notes (Signed)
Offered food to patient, states will have someone bring him something when he is hungry. Continues to complain of pain, informed pt that he is unable to get more dilaudid until 1026, understands. Denies other needs at this time.

## 2021-09-15 NOTE — H&P (Signed)
History and Physical    Patient: [name] DOB: 01/05/1956 DOA: 09/14/2021 DOS: the patient was seen and examined on 09/15/2021 PCP: [name], MD  Patient coming from: Home  Chief Complaint: Right-sided pain uncontrolled on home regimen  HPI: [name] is a 66 y.o. male with medical history significant of lung cancer, chronic pain syndrome, neuroendocrine tumor, CAD SP CABG, HLD, HTN, PE on Xarelto, fibromyalgia, depression.  Presents with complaints of worsening pain on his home regimen. Patient has chronic pain and takes MS Contin and MS IR.  For last few days he has progressively worsening right-sided abdominal pain radiating to his back.  It is continuous in nature.  Associated with cough and movement.  No nausea or vomiting.  No fever no chills.  No fall or trauma no injury.  Reports intermittent dizziness.  No pain anywhere else. Patient is currently undergoing radiation therapy for the new lesion seen on the right side where he has pain. No tingling numbness.  No focal deficit.  Review of Systems: As mentioned in the history of present illness. All other systems reviewed and are negative. Past Medical History:  Diagnosis Date   Anemia    Anxiety    Arthritis    Basal cell carcinoma (BCC) of forehead    CAD (coronary artery disease)    a. 10/2015 ant STEMI >> LHC with 3 v CAD; oLAD tx with POBA >> emergent CABG. b. Multiple evals since that time, early graft failure of SVG-RCA by cath 03/2016. c. 2/19 PCI/DES x1 to pRCA, normal EF.   Carotid artery disease ([place])    a. 40-59% BICA 02/2018.   Depression    Dyspnea    Ectopic atrial tachycardia (HCC)    Esophageal reflux    eosinophil esophagitis   Family history of adverse reaction to anesthesia    "sister has PONV" (06/21/2017)   Former tobacco use    Gout    Hepatitis C    "treated and cured" (06/21/2017)   High cholesterol    History of blood transfusion    History of kidney stones     Hypertension    Ischemic cardiomyopathy    a. EF 25-30% at intraop TEE 4/17  //  b. Limited Echo 5/17 - EF 45-50%, mild ant HK. c. EF 55-65% by cath 09/2017.   Migraine    "3-4/yr" (06/21/2017)   Myocardial infarction ([place]) 10/2015   Palpitations    Pneumonia    Sinus bradycardia    a. HR dropping into 40s in 02/2016 -> BB reduced.   Stroke [facility]) 10/2016   "small one; sometimes my memory/cognitive issues" (06/21/2017)   Symptomatic hypotension    a. 02/2016 ER visit -> meds reduced.   Syncope    Wears dentures    Wears glasses    Past Surgical History:  Procedure Laterality Date   25 GAUGE PARS PLANA VITRECTOMY WITH 20 GAUGE MVR PORT  12/31/2020   Procedure: 25 GAUGE PARS PLANA VITRECTOMY WITH 20 GAUGE MVR PORT;  Surgeon: [name], MD;  Location: [place];  Service: Thoracic;;   ANTERIOR CERVICAL DECOMP/DISCECTOMY FUSION N/A 10/17/2018   Procedure: Anterior Cervical Decompression Fusion - Cervical seven -Thoracic one;  Surgeon: Consuella Lose, MD;  Location: [place];  Service: Neurosurgery;  Laterality: N/A;   BASAL CELL CARCINOMA EXCISION     "forehead   BIOPSY  07/20/2019   Procedure: BIOPSY;  Surgeon: Carol Ada, MD;  Location: [facility];  Service: Endoscopy;;  BIOPSY OF MEDIASTINAL MASS Right 07/29/2021   Procedure: RIGHT CHEST WALL MASS BIOPSY;  Surgeon: [name], MD;  Location: [place];  Service: Thoracic;  Laterality: Right;   BRONCHIAL BIOPSY  12/24/2020   Procedure: BRONCHIAL BIOPSIES;  Surgeon: Garner Nash, DO;  Location: [place] [facility];  Service: Pulmonary;;   BRONCHIAL BRUSHINGS  12/24/2020   Procedure: BRONCHIAL BRUSHINGS;  Surgeon: Garner Nash, DO;  Location: [place] [facility];  Service: Pulmonary;;   BRONCHIAL NEEDLE ASPIRATION BIOPSY  12/24/2020   Procedure: BRONCHIAL NEEDLE ASPIRATION BIOPSIES;  Surgeon: Garner Nash, DO;  Location: [place];  Service: Pulmonary;;   CARDIAC CATHETERIZATION N/A 11/28/2015   Procedure: Left Heart Cath  and Coronary Angiography;  Surgeon: [name], MD;  Location: [place] [facility];  Service: Cardiovascular;  Laterality: N/A;   CARDIAC CATHETERIZATION N/A 11/28/2015   Procedure: Coronary Balloon Angioplasty;  Surgeon: [name], MD;  Location: [place] [facility];  Service: Cardiovascular;  Laterality: N/A;  ostial LAD   CARDIAC CATHETERIZATION N/A 11/28/2015   Procedure: Coronary/Graft Angiography;  Surgeon: [name], MD;  Location: [place] [facility];  Service: Cardiovascular;  Laterality: N/A;  coronaries only    CARDIAC CATHETERIZATION N/A 04/21/2016   Procedure: Left Heart Cath and Coronary Angiography;  Surgeon: Wellington Hampshire, MD;  Location: [place] [facility];  Service: Cardiovascular;  Laterality: N/A;   CARDIAC CATHETERIZATION N/A 06/14/2016   Procedure: Left Heart Cath and Cors/Grafts Angiography;  Surgeon: Lorretta Harp, MD;  Location: [place] [facility];  Service: Cardiovascular;  Laterality: N/A;   CARDIAC CATHETERIZATION N/A 09/08/2016   Procedure: Left Heart Cath and Cors/Grafts Angiography;  Surgeon: Wellington Hampshire, MD;  Location: [place] [facility];  Service: Cardiovascular;  Laterality: N/A;   CARDIAC CATHETERIZATION     CORONARY ARTERY BYPASS GRAFT N/A 11/28/2015   Procedure: CORONARY ARTERY BYPASS GRAFTING (CABG) TIMES FIVE USING LEFT INTERNAL MAMMARY ARTERY AND RIGHT GREATER SAPHENOUS,VIEN HARVEATED BY ENDOVIEN, INTRAOPPRATIVE TEE;  Surgeon: Gaye Pollack, MD;  Location: [place];  Service: Open Heart Surgery;  Laterality: N/A;   CORONARY STENT INTERVENTION N/A 10/05/2017   Procedure: CORONARY STENT INTERVENTION;  Surgeon: [name], MD;  Location: [place] [facility];  Service: Cardiovascular;  Laterality: N/A;   ESOPHAGOGASTRODUODENOSCOPY (EGD) WITH PROPOFOL N/A 07/20/2019   Procedure: ESOPHAGOGASTRODUODENOSCOPY (EGD) WITH PROPOFOL;  Surgeon: Carol Ada, MD;  [facility];  Service: Endoscopy;  Laterality: N/A;    ESOPHAGOGASTRODUODENOSCOPY (EGD) WITH PROPOFOL N/A 01/30/2021   Procedure: ESOPHAGOGASTRODUODENOSCOPY (EGD) WITH PROPOFOL;  Surgeon: Carol Ada, MD;  [facility];  Service: Endoscopy;  Laterality: N/A;   FIDUCIAL MARKER PLACEMENT  12/24/2020   Procedure: FIDUCIAL DYE MARKER PLACEMENT;  Surgeon: Garner Nash, DO;  Location: [place] [facility];  Service: Pulmonary;;   HUMERUS SURGERY Right 1969   "tumor inside bone; filled it w/bone chips"   INTERCOSTAL NERVE BLOCK Right 12/24/2020   Procedure: INTERCOSTAL NERVE BLOCK;  Surgeon: [name], MD;  Location: [place];  Service: Thoracic;  Laterality: Right;   IR IMAGING GUIDED PORT INSERTION  02/06/2021   LEFT HEART CATH AND CORS/GRAFTS ANGIOGRAPHY N/A 03/11/2017   Procedure: Left Heart Cath and Cors/Grafts Angiography;  Surgeon: Leonie Man, MD;  Location: [place] [facility];  Service: Cardiovascular;  Laterality: N/A;   LEFT HEART CATH AND CORS/GRAFTS ANGIOGRAPHY N/A 10/05/2017   Procedure: LEFT HEART CATH AND CORS/GRAFTS ANGIOGRAPHY;  Surgeon: [name], MD;  Location: [facility] IN[facility]  [facility];  Service: Cardiovascular;  Laterality: N/A;   LEFT HEART CATH AND CORS/GRAFTS ANGIOGRAPHY N/A 04/11/2019   Procedure: LEFT HEART CATH AND CORS/GRAFTS ANGIOGRAPHY;  Surgeon: [name], MD;  Location: [place] [facility];  Service: Cardiovascular;  Laterality: N/A;   NODE DISSECTION Right 12/24/2020   Procedure: NODE DISSECTION;  Surgeon: [name], MD;  Location: [place];  Service: Thoracic;  Laterality: Right;   PERIPHERAL VASCULAR CATHETERIZATION N/A 06/14/2016   Procedure: Lower Extremity Angiography;  Surgeon: Lorretta Harp, MD;  Location: [place] [facility];  Service: Cardiovascular;  Laterality: N/A;   VIDEO BRONCHOSCOPY WITH ENDOBRONCHIAL NAVIGATION Right 12/24/2020   Procedure: VIDEO BRONCHOSCOPY WITH ENDOBRONCHIAL NAVIGATION;  Surgeon: Garner Nash, DO;  Location: [place];  Service: Pulmonary;   Laterality: Right;   VIDEO BRONCHOSCOPY WITH ENDOBRONCHIAL ULTRASOUND N/A 12/24/2020   Procedure: VIDEO BRONCHOSCOPY WITH ENDOBRONCHIAL ULTRASOUND;  Surgeon: Garner Nash, DO;  Location: [place];  Service: Pulmonary;  Laterality: N/A;   VIDEO BRONCHOSCOPY WITH INSERTION OF INTERBRONCHIAL VALVE (IBV) N/A 12/31/2020   Procedure: VIDEO BRONCHOSCOPY WITH INSERTION OF INTERBRONCHIAL VALVE (IBV).VALVE IN CARTRIDGE 46mm,9mm. CHEST TUBE PLACEMENT.;  Surgeon: [name], MD;  Location[facility];  Service: Thoracic;  Laterality: N/A;   VIDEO BRONCHOSCOPY WITH INSERTION OF INTERBRONCHIAL VALVE (IBV) N/A 07/29/2021   Procedure: VIDEO BRONCHOSCOPY WITH REMOVAL OF INTERBRONCHIAL VALVE (IBV);  Surgeon: [name], MD;  Location: [facility];  Service: Thoracic;  Laterality: N/A;   Social History:  reports that he quit smoking about 5 years ago. His smoking use included cigarettes. He has a 33.00 pack-year smoking history. He has never used smokeless tobacco. He reports that he does not currently use alcohol. He reports that he does not use drugs.  Allergies  Allergen Reactions   Indomethacin Other (See Comments)    rectal bleeding   Prednisone Other (See Comments)    States that this med makes him "crazy"   Tetanus Toxoids Swelling and Other (See Comments)    Fever, Swelling of the arm    Wellbutrin [Bupropion] Other (See Comments)    Crazy thoughts, nightmares   Gabapentin     Other reaction(s): Unknown   Varenicline Other (See Comments)    Dreams  Other reaction(s): Unknown    Family History  Problem Relation Age of Onset   Lung cancer Mother    Heart Problems Father    Heart attack Father 80   Stroke Father    Heart failure Father    Heart attack Maternal Grandmother    Stroke Maternal Grandmother    Heart attack Paternal Uncle    Hypertension Brother    Autoimmune disease Neg Hx     Prior to Admission medications   Medication Sig Start Date End Date Taking? Authorizing  Provider  acetaminophen (TYLENOL) 500 MG tablet Take 1,000 mg by mouth every 6 (six) hours as needed for mild pain, fever or headache.    [provider]  aspirin EC 81 MG tablet Take 81 mg by mouth daily. Swallow whole.    [provider]  atorvastatin (LIPITOR) 80 MG tablet TAKE 1 TABLET(80 MG) BY MOUTH DAILY Patient taking differently: Take 80 mg by mouth daily. 04/01/21   [name], MD  docusate sodium (COLACE) 100 MG capsule Take 100-200 mg by mouth daily.    [provider]  DULoxetine 40 MG CPEP Take 40 mg by mouth every evening. 08/01/21   Gold, Patrick Jupiter E, PA-C  gabapentin (NEURONTIN) 100  MG capsule Take 1 capsule by mouth twice a day and 3 capsules at bedtime. 09/09/21   Pickenpack-Cousar, Carlena Sax, NP  lidocaine (LIDODERM) 5 % Place 1 patch onto the skin daily. Remove & Discard patch within 12 hours or as directed by MD Patient taking differently: Place 1 patch onto the skin daily as needed (pain). Remove & Discard patch within 12 hours or as directed by MD 06/26/21   Quintella Reichert, MD  metoprolol succinate (TOPROL-XL) 25 MG 24 hr tablet Take 1 tablet (25 mg total) by mouth daily. Patient taking differently: Take 25 mg by mouth every evening. 08/27/20   [name], MD  midodrine (PROAMATINE) 2.5 MG tablet Take 1 tablet (2.5 mg total) by mouth 3 (three) times daily with meals. Patient taking differently: Take 2.5 mg by mouth 3 (three) times daily as needed (Hypotension). 06/15/21 12/12/21  Loel Dubonnet, NP  morphine (MS CONTIN) 30 MG 12 hr tablet Take 1 tablet by mouth every 8 hours. 09/02/21   Pickenpack-Cousar, Carlena Sax, NP  morphine (MSIR) 15 MG tablet Take 1-2 tablets by mouth with food every 4 hours as needed for moderate to severe pain. 09/10/21   Pickenpack-Cousar, Carlena Sax, NP  Multiple Vitamin (MULTIVITAMIN WITH MINERALS) TABS tablet Take 1 tablet by mouth daily.    [provider]  ondansetron (ZOFRAN) 8 MG tablet Take 1 tablet  (8 mg total) by mouth every 8 (eight) hours as needed for nausea or vomiting. Starting 3 days after chemotherapy 08/27/21   Pickenpack-Cousar, Carlena Sax, NP  polyethylene glycol (MIRALAX / GLYCOLAX) 17 g packet Take 17 g by mouth daily. 09/04/21   Mariel Aloe, MD  ranolazine (RANEXA) 1000 MG SR tablet Take 1 tablet (1,000 mg total) by mouth 2 (two) times daily. 05/25/21   [name], MD  tiZANidine (ZANAFLEX) 2 MG tablet Take 1 tablet (2 mg total) by mouth at bedtime as needed for muscle spasms. 09/09/21   Pickenpack-Cousar, Carlena Sax, NP  XARELTO 20 MG TABS tablet Take 20 mg by mouth every evening. 06/01/21   [provider]    Physical Exam: Vitals:   09/15/21 1231 09/15/21 1454 09/15/21 1530 09/15/21 1619  BP: 117/71 120/79 (!) 113/54 108/86  Pulse: 82 (!) 115 (!) 103 (!) 125  Resp: 14 17 18 18   Temp:    99.2 F (37.3 C)  TempSrc:    Oral  SpO2: 96% 99% 98% 100%  Weight:      Height:       Vitals:   09/15/21 1231 09/15/21 1454 09/15/21 1530 09/15/21 1619  BP: 117/71 120/79 (!) 113/54 108/86  Pulse: 82 (!) 115 (!) 103 (!) 125  Resp: 14 17 18 18   Temp:    99.2 F (37.3 C)  TempSrc:    Oral  SpO2: 96% 99% 98% 100%  Weight:      Height:        General: Appear in mild distress, no Rash; Oral Mucosa Clear, moist. no Abnormal Neck Mass Or lumps, Conjunctiva normal  Cardiovascular: S1 and S2 Present, no Murmur, Respiratory: good respiratory effort, Bilateral Air entry present and right-sided crackles, no wheezes Abdomen: Bowel Sound present, Soft and no tenderness Extremities: no Pedal edema Neurology: alert and oriented to time, place, and person affect appropriate. no new focal deficit Gait not checked due to patient safety concerns   Data Reviewed:  I have Reviewed nursing notes, Vitals, and Lab results after pt's last encounter. Pertinent lab results CMP which  is normal, troponin which is negative, CBC shows mild anemia, I ordered labs including CBC, I  independently visualized and interpreted imaging which showed no evidence of PE on the CT scan of the chest., and I reviewed the last note from radiation oncology, medical oncology, palliative care.   Assessment/Plan * Cancer associated pain- (present on admission) Fibromyalgia. Associated with metastatic lung cancer as well as neuroendocrine carcinoma. Currently undergoing radiation therapy for the same. Pain not controlled with home regimen of MS Contin 30 mg 3 times daily, MS IR 1 to 2 tablets every 4 hours as needed, gabapentin and Cymbalta. At present I will continue gabapentin and Cymbalta. Increase MS Contin from 30 mg to 45 mg 3 times daily. Continue MS IR 30 mg every 3 hours as needed. Continue lidocaine patch and heating pad. For now we will use Dilaudid 1 to 2 mg every 2 hours as needed. Palliative care consult for further assessment of pain control.  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 CT chest shows lytic lesions involving multiple right-sided ribs with a pathological fracture of 11th rib. Also multiple subcentimeter lung nodules consistent with pulmonary metastatic disease. Along with that soft tissue mass in the lateral and the posterior aspect of the right chest wall with pleural invasion. CT scan in August had none of this finding. Fairly aggressive disease process. Oncology recently recommended palliative care with hospice versus chemotherapy with carboplatin Alimta and Keytruda. Patient scheduled to follow-up with oncology outpatient. Currently undergoing radiation therapy, I have notified radiation oncology.  Will need to confirm with the patient can continue his radiation tomorrow.  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  Last BM 1/16.  History of pulmonary embolus (PE)- (present on admission) On  Xarelto. Will continue.  Depression- (present on admission) Continue Cymbalta.  S/P CABG x 5 CAD with CABG. On aspirin.  Continue.  Hyperlipidemia LDL goal <70- (present on admission) On Lipitor 80 mg. In the setting of malnutrition may benefit from a lower dose of Lipitor.  Essential hypertension- (present on admission) Patient actually on midodrine at home.  Will hold Toprol-XL for now.  Protein-calorie malnutrition, moderate ([place])- (present on admission) Continue nutritional supplements.  Goals of care, counseling/discussion Discussed with patient regarding his goals of care. Patient has remained full code for multiple hospitalization until now. Patient does not want to be kept alive in a vegetative state. Patient understand that his risk for recovery after a cardiac or respiratory arrest is limited and risk for hip fracture and pain even in the setting of recovery is high. Patient does not want to go through CPR for limited again and would like to remain comfortable in event of cardiac or respiratory arrest. Status changed to limited code to include BiPAP. Wife at the bedside at the time of this discussion.  While she is not in agreement of patient's decision, she is supportive. Patient understands a DNR does not mean do not treat.    Advance Care Planning:   Code Status: Partial Code  See above.  Consults: none, informed radiation oncology about patient's arrival  Family Communication: Wife at bedside.  Severity of Illness: The appropriate patient status for this patient is OBSERVATION. Observation status is judged to be reasonable and necessary in order to provide the required intensity of service to ensure the patient's safety. The patient's presenting symptoms, physical exam findings, and initial radiographic and laboratory data in  the context of their medical condition is felt to place them at decreased risk for further clinical deterioration. Furthermore, it is  anticipated that the patient will be medically stable for discharge from the hospital within 2 midnights of admission.   Author: Berle Mull, MD 09/15/2021 6:19 PM  For on call review [email or web address].

## 2021-09-15 NOTE — Assessment & Plan Note (Signed)
Continue Cymbalta.

## 2021-09-15 NOTE — Assessment & Plan Note (Signed)
Continue nutritional supplements.

## 2021-09-15 NOTE — Assessment & Plan Note (Addendum)
CAD with CABG. On aspirin.

## 2021-09-15 NOTE — ED Notes (Signed)
Pt eating lunch at this time, report given to [facility]

## 2021-09-15 NOTE — Assessment & Plan Note (Addendum)
-   Patient having significant amount of anxiety associated around progression of his malignancy with new findings noted on imaging on admission; he has spoken with Dr. [name] - unfortunately he does exhibit poor control of his emotions in general and he appears to have difficulty with acceptance in general - palliative care has been following closely and he will continue with palliative discussions back at the cancer center upon discharge as well

## 2021-09-15 NOTE — Assessment & Plan Note (Addendum)
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

## 2021-09-15 NOTE — Assessment & Plan Note (Signed)
On Xarelto. Will continue.

## 2021-09-15 NOTE — Assessment & Plan Note (Addendum)
-   Continue Toprol 

## 2021-09-15 NOTE — Assessment & Plan Note (Addendum)
-   Last LDL 28 on 12/27/2020 -Given underlying malignancy with progression, patient has no mortality benefit for continuing statin - DC Lipitor

## 2021-09-16 ENCOUNTER — Inpatient Hospital Stay (HOSPITAL_COMMUNITY): Payer: Medicare Other

## 2021-09-16 ENCOUNTER — Telehealth: Payer: Self-pay | Admitting: Medical Oncology

## 2021-09-16 ENCOUNTER — Ambulatory Visit
Admission: RE | Admit: 2021-09-16 | Discharge: 2021-09-16 | Disposition: A | Discharge status: Home / Self Care | Payer: Medicare Other | Source: Ambulatory Visit | Attending: Radiation Oncology | Admitting: Radiation Oncology

## 2021-09-16 ENCOUNTER — Other Ambulatory Visit: Payer: Medicare Other

## 2021-09-16 DIAGNOSIS — Z87891 Personal history of nicotine dependence: Secondary | ICD-10-CM | POA: Diagnosis not present

## 2021-09-16 DIAGNOSIS — M797 Fibromyalgia: Secondary | ICD-10-CM | POA: Diagnosis present

## 2021-09-16 DIAGNOSIS — C7931 Secondary malignant neoplasm of brain: Secondary | ICD-10-CM | POA: Diagnosis present

## 2021-09-16 DIAGNOSIS — Z515 Encounter for palliative care: Secondary | ICD-10-CM | POA: Diagnosis not present

## 2021-09-16 DIAGNOSIS — C7951 Secondary malignant neoplasm of bone: Secondary | ICD-10-CM | POA: Diagnosis present

## 2021-09-16 DIAGNOSIS — E440 Moderate protein-calorie malnutrition: Secondary | ICD-10-CM | POA: Diagnosis present

## 2021-09-16 DIAGNOSIS — R079 Chest pain, unspecified: Secondary | ICD-10-CM | POA: Diagnosis not present

## 2021-09-16 DIAGNOSIS — I255 Ischemic cardiomyopathy: Secondary | ICD-10-CM | POA: Diagnosis present

## 2021-09-16 DIAGNOSIS — I252 Old myocardial infarction: Secondary | ICD-10-CM | POA: Diagnosis not present

## 2021-09-16 DIAGNOSIS — Z8673 Personal history of transient ischemic attack (TIA), and cerebral infarction without residual deficits: Secondary | ICD-10-CM | POA: Diagnosis not present

## 2021-09-16 DIAGNOSIS — I2510 Atherosclerotic heart disease of native coronary artery without angina pectoris: Secondary | ICD-10-CM | POA: Diagnosis present

## 2021-09-16 DIAGNOSIS — Z86711 Personal history of pulmonary embolism: Secondary | ICD-10-CM | POA: Diagnosis not present

## 2021-09-16 DIAGNOSIS — Z87442 Personal history of urinary calculi: Secondary | ICD-10-CM | POA: Diagnosis not present

## 2021-09-16 DIAGNOSIS — Z981 Arthrodesis status: Secondary | ICD-10-CM | POA: Diagnosis not present

## 2021-09-16 DIAGNOSIS — Z9221 Personal history of antineoplastic chemotherapy: Secondary | ICD-10-CM | POA: Diagnosis not present

## 2021-09-16 DIAGNOSIS — C7A1 Malignant poorly differentiated neuroendocrine tumors: Secondary | ICD-10-CM | POA: Diagnosis present

## 2021-09-16 DIAGNOSIS — R52 Pain, unspecified: Secondary | ICD-10-CM | POA: Diagnosis not present

## 2021-09-16 DIAGNOSIS — C801 Malignant (primary) neoplasm, unspecified: Secondary | ICD-10-CM | POA: Diagnosis present

## 2021-09-16 DIAGNOSIS — Z902 Acquired absence of lung [part of]: Secondary | ICD-10-CM | POA: Diagnosis not present

## 2021-09-16 DIAGNOSIS — Z7189 Other specified counseling: Secondary | ICD-10-CM | POA: Diagnosis not present

## 2021-09-16 DIAGNOSIS — Z85828 Personal history of other malignant neoplasm of skin: Secondary | ICD-10-CM | POA: Diagnosis not present

## 2021-09-16 DIAGNOSIS — M8448XA Pathological fracture, other site, initial encounter for fracture: Secondary | ICD-10-CM | POA: Diagnosis present

## 2021-09-16 DIAGNOSIS — I10 Essential (primary) hypertension: Secondary | ICD-10-CM | POA: Diagnosis present

## 2021-09-16 DIAGNOSIS — Z20822 Contact with and (suspected) exposure to covid-19: Secondary | ICD-10-CM | POA: Diagnosis present

## 2021-09-16 DIAGNOSIS — G893 Neoplasm related pain (acute) (chronic): Secondary | ICD-10-CM | POA: Diagnosis present

## 2021-09-16 DIAGNOSIS — Z951 Presence of aortocoronary bypass graft: Secondary | ICD-10-CM | POA: Diagnosis not present

## 2021-09-16 DIAGNOSIS — K5900 Constipation, unspecified: Secondary | ICD-10-CM | POA: Diagnosis present

## 2021-09-16 DIAGNOSIS — F32A Depression, unspecified: Secondary | ICD-10-CM | POA: Diagnosis present

## 2021-09-16 DIAGNOSIS — Z923 Personal history of irradiation: Secondary | ICD-10-CM | POA: Diagnosis not present

## 2021-09-16 LAB — CBC
HCT: 33.9 % — ABNORMAL LOW (ref 39.0–52.0)
Hemoglobin: 10.7 g/dL — ABNORMAL LOW (ref 13.0–17.0)
MCH: 29.6 pg (ref 26.0–34.0)
MCHC: 31.6 g/dL (ref 30.0–36.0)
MCV: 93.9 fL (ref 80.0–100.0)
Platelets: 217 10*3/uL (ref 150–400)
RBC: 3.61 MIL/uL — ABNORMAL LOW (ref 4.22–5.81)
RDW: 14 % (ref 11.5–15.5)
WBC: 6.5 10*3/uL (ref 4.0–10.5)
nRBC: 0 % (ref 0.0–0.2)

## 2021-09-16 LAB — COMPREHENSIVE METABOLIC PANEL
ALT: 10 U/L (ref 0–44)
AST: 15 U/L (ref 15–41)
Albumin: 3.4 g/dL — ABNORMAL LOW (ref 3.5–5.0)
Alkaline Phosphatase: 63 U/L (ref 38–126)
Anion gap: 5 (ref 5–15)
BUN: 17 mg/dL (ref 8–23)
CO2: 28 mmol/L (ref 22–32)
Calcium: 10.2 mg/dL (ref 8.9–10.3)
Chloride: 102 mmol/L (ref 98–111)
Creatinine, Ser: 0.96 mg/dL (ref 0.61–1.24)
GFR, Estimated: >60 mL/min (ref >60)
Glucose, Bld: 105 mg/dL — ABNORMAL HIGH (ref 70–99)
Potassium: 4.5 mmol/L (ref 3.5–5.1)
Sodium: 135 mmol/L (ref 135–145)
Total Bilirubin: 0.7 mg/dL (ref 0.3–1.2)
Total Protein: 6.3 g/dL — ABNORMAL LOW (ref 6.5–8.1)

## 2021-09-16 NOTE — Telephone Encounter (Signed)
In light of new results on CT angio pt stated  he wants to see [name] and start chemo asap. I told the pt several times that [name] wants him to finish his radiation first then he will see him to discuss chemotherapy.  He wants to move up appt with [facility].  His last xrt tx is scheduled for 01/24,but he has missed a few tx.   I told pt  [name] will determine whether or not to move up appt.   I told him to keep appt @ [facility] on Monday.

## 2021-09-16 NOTE — Evaluation (Signed)
Physical Therapy Evaluation Patient Details Name: [name] MRN: [id number] DOB: Nov 25, 1955 Today's Date: 09/16/2021  History of Present Illness  Pt is 66 yo male admitted on 09/14/21 with R sided rib pain - chronic, CA related (mets to ribs with fx on R).  Pt with hx including but not limited to lung CA, chronic pain, neuroendocrine tumor, CAD, CABG, HLD, HTN, PE, fibromyalgia, and depression.  Clinical Impression  Pt admitted with above diagnosis.  At baseline, pt is active.  He is very pleasant and motivated despite pain.  Demonstrated transfers, gait, and stairs safely, independently, and without difficulty other than increased pain.  Educated pt on positions for comfort, but no other skilled therapy required.  Will sign off.        Recommendations for follow up therapy are one component of a multi-disciplinary discharge planning process, led by the attending physician.  Recommendations may be updated based on patient status, additional functional criteria and insurance authorization.  Follow Up Recommendations No PT follow up    Assistance Recommended at Discharge None  Patient can return home with the following       Equipment Recommendations None recommended by PT  Recommendations for Other Services       Functional Status Assessment Patient has not had a recent decline in their functional status     Precautions / Restrictions Precautions Precautions: None      Mobility  Bed Mobility Overal bed mobility: Needs Assistance Bed Mobility: Supine to Sit, Sit to Supine     Supine to sit: Independent Sit to supine: Independent        Transfers Overall transfer level: Needs assistance Equipment used: None Transfers: Sit to/from Stand Sit to Stand: Independent           General transfer comment: Had supervision but demonstrated safely    Ambulation/Gait Ambulation/Gait assistance: Independent Gait Distance (Feet): 500 Feet Assistive device: None Gait  Pattern/deviations: WFL(Within Functional Limits) Gait velocity: normal     General Gait Details: Supervision with therapy but demonstrating safely and independent level  Stairs Stairs: Yes Stairs assistance: Supervision Stair Management: One rail Left, Forwards, Alternating pattern Number of Stairs: 12 General stair comments: performed easily  Wheelchair Mobility    Modified Rankin (Stroke Patients Only)       Balance Overall balance assessment: Independent   Sitting balance-Leahy Scale: Normal       Standing balance-Leahy Scale: Normal                               Pertinent Vitals/Pain Pain Assessment Pain Assessment: 0-10 Pain Score: 7  Pain Location: R ribs Pain Descriptors / Indicators: Discomfort Pain Intervention(s): Limited activity within patient's tolerance, Monitored during session, Patient requesting pain meds-RN notified    Home Living Family/patient expects to be discharged to:: Private residence Living Arrangements: Spouse/significant other Available Help at Discharge: Family;Available PRN/intermittently   Home Access: Stairs to enter Entrance Stairs-Rails: Right;Left Entrance Stairs-Number of Steps: 6 Alternate Level Stairs-Number of Steps: 13 Home Layout: Two level;Bed/bath upstairs Home Equipment: None      Prior Function Prior Level of Function : Independent/Modified Independent;Working/employed;Driving                     Hand Dominance        Extremity/Trunk Assessment   Upper Extremity Assessment Upper Extremity Assessment: Overall WFL for tasks assessed    Lower Extremity Assessment Lower Extremity Assessment: Overall  WFL for tasks assessed (Did not MMT any due to rib pain but appears functional)    Cervical / Trunk Assessment Cervical / Trunk Assessment: Normal  Communication   Communication: No difficulties  Cognition Arousal/Alertness: Awake/alert Behavior During Therapy: WFL for tasks  assessed/performed Overall Cognitive Status: Within Functional Limits for tasks assessed                                 General Comments: very pleasant and motivated        General Comments General comments (skin integrity, edema, etc.): Pt's HR 120 rest and 137 bpm walking.  He did have increased rib pain with walking - discussed likely taking deeper breaths and core muscle activation attached to ribs likely cause.  Stil tolerated well. Edcuated on sleeping slightly elevated for improved pain control compared to laying flat.    Exercises     Assessment/Plan    PT Assessment Patient does not need any further PT services  PT Problem List         PT Treatment Interventions      PT Goals (Current goals can be found in the Care Plan section)  Acute Rehab PT Goals Patient Stated Goal: decrease pain to maintain activity PT Goal Formulation: All assessment and education complete, DC therapy    Frequency       Co-evaluation               AM-PAC PT "6 Clicks" Mobility  Outcome Measure Help needed turning from your back to your side while in a flat bed without using bedrails?: None Help needed moving from lying on your back to sitting on the side of a flat bed without using bedrails?: None Help needed moving to and from a bed to a chair (including a wheelchair)?: None Help needed standing up from a chair using your arms (e.g., wheelchair or bedside chair)?: None Help needed to walk in hospital room?: None Help needed climbing 3-5 steps with a railing? : None 6 Click Score: 24    End of Session   Activity Tolerance: Patient tolerated treatment well Patient left: Other (comment) (walking in room) Nurse Communication: Mobility status PT Visit Diagnosis: Other abnormalities of gait and mobility (R26.89)    Time: 7544-9201 PT Time Calculation (min) (ACUTE ONLY): 20 min   Charges:   PT Evaluation $PT Eval Low Complexity: 1 Low          Trysten Berti,  PT Acute Rehab Services Pager [phone] [facility] [phone]   Karlton Lemon 09/16/2021, 4:09 PM

## 2021-09-16 NOTE — Progress Notes (Signed)
Pt states that he felt a possible pop in posterior rib area, he feels he made his fracture worse. Pt unable to move. Hospitalist notified, awaiting orders.

## 2021-09-16 NOTE — Progress Notes (Signed)
°  Transition of Care [facility]) Screening Note   Patient Details  Name: [name] Date of Birth: 31-Jan-1956   Transition of Care [facility]) CM/SW Contact:    Lennart Pall, LCSW Phone Number: 09/16/2021, 12:38 PM    Transition of Care Department [facility]) has reviewed patient and no TOC needs have been identified at this time. We will continue to monitor patient advancement through interdisciplinary progression rounds. If new patient transition needs arise, please place a TOC consult.

## 2021-09-16 NOTE — Hospital Course (Addendum)
[name] is a 66 y.o. 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  Also has history of chronic pain, CAD s/p CABG, HLD, HTN, PE on Xarelto, fibromyalgia, depression.  Patient presented with worsening right-sided chest wall pain radiating to his back. CT angio chest showed no PE.  There was mild interval increase in size of previously noted left upper lobe nodule with multiple new bilateral noncalcified lung nodules consistent with pulmonary metastasis.  Also interval enlargement of soft tissue mass seen within the right posterior chest wall with pleural invasion along right lung base.  Also multiple right-sided lytic lesions with pathologic fracture involving right 11th rib. He was admitted for pain control. See below for further problem based plan.

## 2021-09-16 NOTE — Progress Notes (Signed)
Progress Note    [name]   WCH:[id number]  DOB: 1956/08/17  DOA: 09/14/2021     0 PCP: [name], MD  Initial CC: pain  Hospital Course: [name] is a 66 y.o. 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  Also has history of chronic pain, CAD s/p CABG, HLD, HTN, PE on Xarelto, fibromyalgia, depression.  Patient presented with worsening right-sided chest wall pain radiating to his back. CT angio chest showed no PE.  There was mild interval increase in size of previously noted left upper lobe nodule with multiple new bilateral noncalcified lung nodules consistent with pulmonary metastasis.  Also interval enlargement of soft tissue mass seen within the right posterior chest wall with pleural invasion along right lung base.  Also multiple right-sided lytic lesions with pathologic fracture involving right 11th rib. He was admitted for pain control.  Interval History:  Resting in bed appearing uncomfortable but no distress.  Reviewed his cancer history and treatment course some with him this morning.  Wife also came in towards the end of our discussion. He was insistent on being able to talk with oncology while he was here which I stated I would inform them. Pain regimen is main goal for adjustment to control his pain.  Assessment & Plan: * Cancer associated pain- (present on admission) - Fibromyalgia. Associated with metastatic lung cancer as well as neuroendocrine carcinoma. - Currently undergoing radiation therapy for the same. 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) - patient has  had brain mets and now has further progression of disease involving lungs and bone mets - prognosis poor but patient does wish to discuss with oncology further - for now pain control is the plan while hospitalized given severe pain on admission with already high doses of pain meds - MS Contin was increased on admission also - may need to consider fentanyl patch next but will defer to palliative care as they have also been consulted on admission, greatly appreciate assistance - CT chest shows lytic lesions involving multiple right-sided ribs with a pathological fracture of 11th rib  Goals of care, counseling/discussion - Patient having significant amount of anxiety associated around progression of his malignancy with new findings noted on imaging on admission; tried discussing some goals of care however he is insistent on further talking with oncology while he is hospitalized - Have reached out to oncology to inform him of patient's admission for further discussions regarding treatment and next steps -I did reiterate multiple times his stage IV cancer being uncurable, which he does state he understands, however I am also questioning some denial given his insistence on trying to start chemo "ASAP"  Constipation- (present on admission) Continue Senokot, MiraLAX.  History of pulmonary embolus (PE)- (present on admission) On Xarelto. Will continue.  Protein-calorie malnutrition, moderate ([place])- (present on admission) Continue nutritional supplements.  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[place])- (present on admission)    Essential hypertension- (present on admission) - Continue Toprol  Hyperlipidemia LDL goal <70- (present on admission) - Last LDL 28 on 12/27/2020 -Given underlying malignancy with progression, patient has no mortality benefit for continuing statin - DC Lipitor  S/P CABG x 5 CAD with CABG. On aspirin.   Depression- (present on admission) Continue  Cymbalta.    Old records reviewed in assessment of this patient  Antimicrobials:   DVT prophylaxis: Xarelto  Code Status:   Code Status: Partial Code  Disposition Plan:  Home Status is: Inpt  Objective: Blood pressure 116/82, pulse (!) 105, temperature 99.2 F (37.3 C), temperature source Oral, resp. rate 20, height 5\' 9"  (1.753 m), weight 78.5 kg, SpO2 99 %.  Examination:  Physical Exam Constitutional:      Appearance: Normal appearance.  HENT:     Head: Normocephalic and atraumatic.     Mouth/Throat:     Mouth: Mucous membranes are moist.  Eyes:     Extraocular Movements: Extraocular movements intact.  Cardiovascular:     Rate and Rhythm: Normal rate and regular rhythm.     Heart sounds: Normal heart sounds.  Pulmonary:     Effort: Pulmonary effort is normal. No respiratory distress.     Breath sounds: Normal breath sounds. No wheezing.  Abdominal:     General: Bowel sounds are normal. There is no distension.     Palpations: Abdomen is soft.     Tenderness: There is no abdominal tenderness.  Musculoskeletal:        General: Normal range of motion.     Cervical back: Normal range of motion and neck supple.  Skin:    General: Skin is warm and dry.  Neurological:     Mental Status: He is alert and oriented to person, place, and time.  Psychiatric:        Mood and Affect: Mood normal.        Behavior: Behavior normal.     Consultants:  Oncology Palliative Care  Procedures:    Data Reviewed: I have personally reviewed labs and imaging studies    LOS: 0 days  Time spent: Greater than 50% of the 35 minute visit was spent in counseling/coordination of care for the patient as laid out in the A&P.   Dwyane Dee, MD [facility] 09/16/2021, 4:27 PM

## 2021-09-16 NOTE — Progress Notes (Signed)
Chaplain engaged in an initial visit with [name] and his wife, [name].  Chaplain spent a significant amount of time learning about [name], his family, and his state of health.  [name] and [name] have been together about 39 years altogether.  Though they are not originally from [place], they found [place] to be a great place to raise their three daughters.  Their daughters have brought them a lot of joy and they are very proud of them.  [name] shared about the adventurous nature of one of his daughters and the many state trophies his children hold from sports.  They are also proud grandparents.  [name] talked about how being a grandparent is so different from being a father.  He gushed over his 65 old grandson.    [name] also spent some time talking about cancer and faith.  He detailed knowing that his cancer is terminal and that it is leading to death, but also wanting to take any treatment plan or option available to prolong his life.  He voiced that he wants to continue to fight.  [name] also expressed some gratefulness around knowing what he is going to die from and the time he has left to prepare and get things in order for his family.  He voiced that he appreciates that he won't just die instantly but that he has some time. 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  He noted that he and his wife have been dealing with his diagnosis the best way that they can.   Chaplain offered listening, support, presence and prayer.    09/16/21 1300  Clinical Encounter Type  Visited With Patient and family together  Visit Type Initial;Spiritual support  Spiritual Encounters  Spiritual Needs Emotional;Grief support;Prayer  Stress Factors  Patient Stress Factors Major life changes;Health changes  Family Stress Factors Loss of control;Major life changes

## 2021-09-16 NOTE — Plan of Care (Signed)
Pain well-controlled with current regimen. Problem: Education: Goal: Knowledge of General Education information will improve Description: Including pain rating scale, medication(s)/side effects and non-pharmacologic comfort measures Outcome: Progressing   Problem: Health Behavior/Discharge Planning: Goal: Ability to manage health-related needs will improve Outcome: Progressing   Problem: Clinical Measurements: Goal: Ability to maintain clinical measurements within normal limits will improve Outcome: Progressing Goal: Will remain free from infection Outcome: Progressing Goal: Diagnostic test results will improve Outcome: Progressing Goal: Respiratory complications will improve Outcome: Progressing Goal: Cardiovascular complication will be avoided Outcome: Progressing   Problem: Activity: Goal: Risk for activity intolerance will decrease Outcome: Progressing   Problem: Nutrition: Goal: Adequate nutrition will be maintained Outcome: Progressing   Problem: Coping: Goal: Level of anxiety will decrease Outcome: Progressing   Problem: Elimination: Goal: Will not experience complications related to bowel motility Outcome: Progressing Goal: Will not experience complications related to urinary retention Outcome: Progressing   Problem: Pain Managment: Goal: General experience of comfort will improve Outcome: Progressing   Problem: Safety: Goal: Ability to remain free from injury will improve Outcome: Progressing   Problem: Skin Integrity: Goal: Risk for impaired skin integrity will decrease Outcome: Progressing   Problem: Bowel/Gastric: Goal: Will not experience complications related to bowel motility Outcome: Progressing   Problem: Coping: Goal: Ability to identify and develop effective coping behavior will improve Outcome: Progressing

## 2021-09-17 ENCOUNTER — Other Ambulatory Visit: Payer: Self-pay | Admitting: Internal Medicine

## 2021-09-17 ENCOUNTER — Ambulatory Visit
Admission: RE | Admit: 2021-09-17 | Discharge: 2021-09-17 | Disposition: A | Discharge status: Home / Self Care | Payer: Medicare Other | Source: Ambulatory Visit | Attending: Radiation Oncology | Admitting: Radiation Oncology

## 2021-09-17 ENCOUNTER — Telehealth: Payer: Self-pay

## 2021-09-17 ENCOUNTER — Ambulatory Visit: Payer: Medicare Other

## 2021-09-17 DIAGNOSIS — C7A1 Malignant poorly differentiated neuroendocrine tumors: Secondary | ICD-10-CM

## 2021-09-17 DIAGNOSIS — M797 Fibromyalgia: Secondary | ICD-10-CM

## 2021-09-17 DIAGNOSIS — R52 Pain, unspecified: Secondary | ICD-10-CM

## 2021-09-17 DIAGNOSIS — R079 Chest pain, unspecified: Secondary | ICD-10-CM

## 2021-09-17 DIAGNOSIS — C3491 Malignant neoplasm of unspecified part of right bronchus or lung: Secondary | ICD-10-CM

## 2021-09-17 DIAGNOSIS — G893 Neoplasm related pain (acute) (chronic): Secondary | ICD-10-CM | POA: Diagnosis not present

## 2021-09-17 DIAGNOSIS — E440 Moderate protein-calorie malnutrition: Secondary | ICD-10-CM

## 2021-09-17 DIAGNOSIS — C799 Secondary malignant neoplasm of unspecified site: Secondary | ICD-10-CM

## 2021-09-17 DIAGNOSIS — R5382 Chronic fatigue, unspecified: Secondary | ICD-10-CM

## 2021-09-17 DIAGNOSIS — Z515 Encounter for palliative care: Secondary | ICD-10-CM

## 2021-09-17 DIAGNOSIS — C801 Malignant (primary) neoplasm, unspecified: Secondary | ICD-10-CM

## 2021-09-17 MED ORDER — CYANOCOBALAMIN 1000 MCG/ML IJ SOLN
1000.0000 ug | Freq: Once | INTRAMUSCULAR | Status: AC
  Administered 2021-09-17: 1000 ug via INTRAMUSCULAR
  Filled 2021-09-17: qty 1

## 2021-09-17 MED ORDER — GABAPENTIN 100 MG PO CAPS
200.0000 mg | ORAL_CAPSULE | Freq: Every day | ORAL | Status: DC
  Administered 2021-09-17 – 2021-09-20 (×4): 200 mg via ORAL
  Filled 2021-09-17 (×4): qty 2

## 2021-09-17 MED ORDER — METHADONE HCL 5 MG PO TABS
5.0000 mg | ORAL_TABLET | Freq: Two times a day (BID) | ORAL | Status: DC
  Administered 2021-09-17 – 2021-09-18 (×2): 5 mg via ORAL
  Filled 2021-09-17 (×2): qty 1

## 2021-09-17 MED ORDER — HYDROMORPHONE HCL 1 MG/ML IJ SOLN
1.0000 mg | INTRAMUSCULAR | Status: DC | PRN
  Administered 2021-09-18 (×3): 1 mg via INTRAVENOUS
  Filled 2021-09-17 (×4): qty 1

## 2021-09-17 MED ORDER — HYDROMORPHONE HCL 1 MG/ML IJ SOLN
1.0000 mg | Freq: Once | INTRAMUSCULAR | Status: AC
  Administered 2021-09-17: 1 mg via INTRAVENOUS
  Filled 2021-09-17: qty 1

## 2021-09-17 MED ORDER — FOLIC ACID 1 MG PO TABS: 1.0000 mg | ORAL_TABLET | Freq: Every day | ORAL | 4 refills | Status: DC

## 2021-09-17 MED ORDER — BISACODYL 5 MG PO TBEC
10.0000 mg | DELAYED_RELEASE_TABLET | Freq: Every day | ORAL | Status: DC
  Administered 2021-09-17 – 2021-09-20 (×4): 10 mg via ORAL
  Filled 2021-09-17 (×4): qty 2

## 2021-09-17 NOTE — Progress Notes (Signed)
Progress Note    [name]   MGQ:[id number]  DOB: 24-Mar-1956  DOA: 09/14/2021     1 PCP: [name], MD  Initial CC: pain  Hospital Course: [name] is a 66 y.o. 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  Also has history of chronic pain, CAD s/p CABG, HLD, HTN, PE on Xarelto, fibromyalgia, depression.  Patient presented with worsening right-sided chest wall pain radiating to his back. CT angio chest showed no PE.  There was mild interval increase in size of previously noted left upper lobe nodule with multiple new bilateral noncalcified lung nodules consistent with pulmonary metastasis.  Also interval enlargement of soft tissue mass seen within the right posterior chest wall with pleural invasion along right lung base.  Also multiple right-sided lytic lesions with pathologic fracture involving right 11th rib. He was admitted for pain control.  Interval History:  No events overnight. He again called his oncology office today despite my long conversation this morning about the plan and re-iterating everything from yesterday. He seems to cycle thru emotions fast (going from upset to understanding to "thanking" me) each day now.  Today,we will again plan to work on pain control, which he is much more comfortable today (sitting up on edge of bed easily, which he couldn't do yesterday for me).  He had already been to radiation this morning as well.   Assessment & Plan: * Cancer associated pain- (present on admission) - Fibromyalgia. Associated with metastatic lung cancer as well as neuroendocrine carcinoma. - Currently undergoing radiation therapy for the same. 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) - patient has had brain mets and now has further progression of disease involving lungs and bone mets - prognosis poor but patient does wish to discuss with oncology further - for now pain control is the plan while hospitalized given severe pain on admission with already high doses of pain meds - MS Contin was increased on admission also - may need to consider fentanyl patch next but will defer to palliative care as they have also been consulted on admission, greatly appreciate assistance - CT chest shows lytic lesions involving multiple right-sided ribs with a pathological fracture of 11th rib  Goals of care, counseling/discussion - Patient having significant amount of anxiety associated around progression of his malignancy with new findings noted on imaging on admission; tried discussing some goals of care however he is insistent on further talking with oncology while he is hospitalized - unfortunately he does exhibit poor control of his emotions and is having difficulty with acceptance; he's also likely going thru stages of grieving at this point (anger, bargaining, denial) especially with calling oncology office daily it appears - we will continue [place] discussion and trying to reassure patient daily but I've also been very blunt and honest with patient that his cancer has progressed and regardless his focus should remain on quality of life over quantity and that he originally came to the hospital for pain control which has been the focus as well  Constipation- (present on admission) Continue Senokot, MiraLAX.  History of pulmonary embolus (PE)- (present on admission) On Xarelto. Will continue.  Protein-calorie malnutrition, moderate ([place])- (present on admission) Continue nutritional supplements.  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[place])- (present on  admission)    Essential hypertension- (present on admission) - Continue Toprol  Hyperlipidemia LDL goal <70- (present on admission) - Last LDL 28 on 12/27/2020 -Given underlying malignancy with progression, patient has no mortality benefit for continuing statin - DC Lipitor  S/P CABG x 5 CAD with CABG. On aspirin.   Depression- (present on admission) Continue Cymbalta.    Old records reviewed in assessment of this patient  Antimicrobials:   DVT prophylaxis: Xarelto  Code Status:   Code Status: Partial Code  Disposition Plan:  Home Status is: Inpt  Objective: Blood pressure 100/68, pulse 98, temperature 97.8 F (36.6 C), temperature source Oral, resp. rate 20, height 5\' 9"  (1.753 m), weight 78.5 kg, SpO2 98 %.  Examination:  Physical Exam Constitutional:      Appearance: Normal appearance.  HENT:     Head: Normocephalic and atraumatic.     Mouth/Throat:     Mouth: Mucous membranes are moist.  Eyes:     Extraocular Movements: Extraocular movements intact.  Cardiovascular:     Rate and Rhythm: Normal rate and regular rhythm.     Heart sounds: Normal heart sounds.  Pulmonary:     Effort: Pulmonary effort is normal. No respiratory distress.     Breath sounds: Normal breath sounds. No wheezing.  Abdominal:     General: Bowel sounds are normal. There is no distension.     Palpations: Abdomen is soft.     Tenderness: There is no abdominal tenderness.  Musculoskeletal:        General: Normal range of motion.     Cervical back: Normal range of motion and neck supple.  Skin:    General: Skin is warm and dry.  Neurological:     Mental Status: He is alert and oriented to person, place, and time.  Psychiatric:        Mood and Affect: Mood normal.        Behavior: Behavior normal.     Consultants:  Oncology Palliative Care  Procedures:    Data Reviewed: I have personally reviewed labs and imaging studies    LOS: 1 day  Time spent: Greater than 50% of the  35 minute visit was spent in counseling/coordination of care for the patient as laid out in the A&P.   Dwyane Dee, MD [facility] 09/17/2021, 3:36 PM

## 2021-09-17 NOTE — Plan of Care (Signed)
Pt doing better as of now. Able to get up to walk to restroom. Pain well-controlled with current regimen.  Problem: Education: Goal: Knowledge of General Education information will improve Description: Including pain rating scale, medication(s)/side effects and non-pharmacologic comfort measures Outcome: Progressing   Problem: Health Behavior/Discharge Planning: Goal: Ability to manage health-related needs will improve Outcome: Progressing   Problem: Clinical Measurements: Goal: Ability to maintain clinical measurements within normal limits will improve Outcome: Progressing Goal: Will remain free from infection Outcome: Progressing Goal: Diagnostic test results will improve Outcome: Progressing Goal: Respiratory complications will improve Outcome: Progressing Goal: Cardiovascular complication will be avoided Outcome: Progressing   Problem: Activity: Goal: Risk for activity intolerance will decrease Outcome: Progressing   Problem: Nutrition: Goal: Adequate nutrition will be maintained Outcome: Progressing   Problem: Coping: Goal: Level of anxiety will decrease Outcome: Progressing   Problem: Elimination: Goal: Will not experience complications related to bowel motility Outcome: Progressing Goal: Will not experience complications related to urinary retention Outcome: Progressing   Problem: Pain Managment: Goal: General experience of comfort will improve Outcome: Progressing   Problem: Safety: Goal: Ability to remain free from injury will improve Outcome: Progressing   Problem: Skin Integrity: Goal: Risk for impaired skin integrity will decrease Outcome: Progressing   Problem: Bowel/Gastric: Goal: Will not experience complications related to bowel motility Outcome: Progressing   Problem: Coping: Goal: Ability to identify and develop effective coping behavior will improve Outcome: Progressing

## 2021-09-17 NOTE — Progress Notes (Signed)
START ON PATHWAY REGIMEN - Non-Small Cell Lung     A cycle is every 21 days:     Pembrolizumab      Pemetrexed      Carboplatin   **Always confirm dose/schedule in your pharmacy ordering system**  Patient Characteristics: Stage IV Metastatic, Nonsquamous, Molecular Analysis Completed, Molecular Alteration Present and Targeted Therapy Exhausted OR EGFR Exon 20+ or KRAS G12C+ or HER2+ Present and No Prior Chemo/Immunotherapy OR No Alteration Present, Initial  Chemotherapy/Immunotherapy, PS = 0, 1, No Alteration Present, No Alteration Present, Candidate for Immunotherapy, PD-L1 Expression Positive 1-49% (TPS) / Negative / Not Tested / Awaiting Test Results and Immunotherapy Candidate Therapeutic Status: Stage IV Metastatic Histology: Nonsquamous Cell Broad Molecular Profiling Status: Molecular Analysis Completed Molecular Analysis Results: No Alteration Present ECOG Performance Status: 1 Chemotherapy/Immunotherapy Line of Therapy: Initial Chemotherapy/Immunotherapy EGFR Exons 18-21 Mutation Testing Status: Completed and Negative ALK Fusion/Rearrangement Testing Status: Completed and Negative BRAF V600 Mutation Testing Status: Completed and Negative KRAS G12C Mutation Testing Status: Completed and Negative MET Exon 14 Mutation Testing Status: Completed and Negative RET Fusion/Rearrangement Testing Status: Completed and Negative HER2 Mutation Testing Status: Completed and Negative NTRK Fusion/Rearrangement Testing Status: Completed and Negative ROS1 Fusion/Rearrangement Testing Status: Completed and Negative Immunotherapy Candidate Status: Candidate for Immunotherapy PD-L1 Expression Status: PD-L1 Negative Intent of Therapy: Non-Curative / Palliative Intent, Discussed with Patient 

## 2021-09-17 NOTE — Progress Notes (Signed)
X-ray ordered, pt comfortable with pain med at this time.

## 2021-09-17 NOTE — Progress Notes (Signed)
DIAGNOSIS:  Stage IV (pT1c, N1, M1b) large cell neuroendocrine carcinoma.  He presented with a right upper lobe lung nodule and hilar lymphadenopathy.  He was diagnosed in April 2022. He also had a small metastatic brain lesion.  The patient has local recurrence of his disease and the right chest wall diagnosed in November 2022. Molecular studies by foundation 1 showed no actionable mutations   PDL1: 0%   PRIOR THERAPY:  1) Robotic assisted thoracic surgery with a right upper lobectomy which was performed on 12/24/2020.  2) SRS to a small metastatic brain lesion completed on February 13, 2021 under the care of Dr. Lisbeth Renshaw.   3) Adjuvant chemotherapy with cisplatin 80 mg/m2 on day 1, etoposide 100 mg per metered squared on days 1, 2, and 3, IV every 3 weeks. First dose on 02/09/21.  Status post 4 cycles. 4) SRS to 4 new brain metastasis under the care of Dr. Lisbeth Renshaw.   CURRENT THERAPY: Palliative radiotherapy to the local disease recurrence on the right chest wall under the care of Dr. Lisbeth Renshaw.   Subjective: The patient is seen and examined today.  His wife [name] was available at the bedside.  He continues to have pain on the right side of the chest.  He was admitted to the hospital because of his uncontrollable pain and requesting IV Dilaudid.  He has been on MS Contin and MSIR on outpatient basis.  He is currently undergoing palliative radiotherapy to the recurrent disease in the right chest wall and bone under the care of Dr. Lisbeth Renshaw.  He is expected to complete his palliative radiotherapy later next week.  He has no current nausea, vomiting, diarrhea or constipation.  He has no headache or visual changes.  He had CT angio scan of the chest as well as CT of the abdomen pelvis this admission and it showed few other tiny pulmonary nodules in addition to the previously known disease recurrence on the right side of the chest.  The patient has been worried about his condition and requested evaluation to discussion of  his treatment options.  Objective: Vital signs in last 24 hours: Temp:  [97.8 F (36.6 C)-98.8 F (37.1 C)] 97.8 F (36.6 C) (01/19 1325) Pulse Rate:  [81-106] 98 (01/19 1325) Resp:  [17-20] 20 (01/19 1325) BP: (94-118)/(62-79) 100/68 (01/19 1325) SpO2:  [98 %-100 %] 98 % (01/19 1325)  Intake/Output from previous day: 01/18 0701 - 01/19 0700 In: 480 [P.O.:480] Out: -  Intake/Output this shift: No intake/output data recorded.  General appearance: alert, cooperative, fatigued, and no distress Resp: clear to auscultation bilaterally Cardio: regular rate and rhythm, S1, S2 normal, no murmur, click, rub or gallop GI: soft, non-tender; bowel sounds normal; no masses,  no organomegaly Extremities: extremities normal, atraumatic, no cyanosis or edema  Lab Results:  Recent Labs    09/14/21 2336 09/16/21 0418  WBC 6.3 6.5  HGB 11.5* 10.7*  HCT 35.6* 33.9*  PLT 227 217   BMET Recent Labs    09/14/21 2336 09/16/21 0418  NA 139 135  K 4.5 4.5  CL 103 102  CO2 29 28  GLUCOSE 100* 105*  BUN 12 17  CREATININE 0.88 0.96  CALCIUM 10.3 10.2    Studies/Results: DG Ribs Unilateral W/Chest Right  Result Date: 09/16/2021 CLINICAL DATA:  Right posterior rib fracture, right flank pain, history of metastatic disease EXAM: RIGHT RIBS AND CHEST - 3+ VIEW COMPARISON:  09/15/2021, 09/14/2021 FINDINGS: Frontal view of the chest as well as frontal and  oblique views of the right thoracic cage are obtained. Cardiac silhouette is stable. Postsurgical changes from median sternotomy. Right chest wall port unchanged. Small right pleural effusion.  No airspace disease or pneumothorax. Lytic lesion posterior right eleventh rib with associated pathologic fracture identified, better visualized on recent CT. The other smaller lytic lesions throughout the remainder of the thoracic cage are not as well visualized by radiograph. IMPRESSION: 1. Lytic lesion and pathologic fracture posterior right eleventh  rib. 2. Small right pleural effusion. 3. Please refer to recent CT report describing numerous other small lytic lesions within the right thoracic cage. Electronically Signed   By: Randa Ngo M.D.   On: 09/16/2021 22:02    Medications: I have reviewed the patient's current medications.  Assessment/Plan: This is a 66 years old white male with stage IV non-small cell lung cancer, large cell neuroendocrine carcinoma status post right upper lobectomy.  The patient was found to have suspicious solitary brain metastasis after his surgery.  He underwent SRS to the lesion as well as 4 cycles of adjuvant systemic chemotherapy initially with cisplatin and etoposide then with carboplatin and etoposide secondary to intolerance.  He had a rough time with this treatment.  He was followed by observation but unfortunately developed disease recurrence in the right chest wall at the site of the surgical excision as well as suspicious osseous metastasis and a few pulmonary nodules.  The patient is currently undergoing palliative radiotherapy to the right chest local recurrence and bone metastasis under the care of Dr. Lisbeth Renshaw and expected to complete this course next week. The patient has been concerned about his condition and would like to have another discussion about his possible treatment options.  He is scheduled to see Dr. Durenda Hurt at [place] next week for second opinion. I had a lengthy discussion with the patient and his wife about his current condition and treatment options.  I personally and independently reviewed the scan images and discussed the result and showed the images to the patient and his wife. I recommended for the patient the following plan: 1) complete the palliative radiotherapy under the care of Dr. Lisbeth Renshaw as planned and last fraction expected on September 23, 2021. 2) see Dr. Durenda Hurt at [facility] for the second opinion next week 3) I would consider him  for systemic chemotherapy with carboplatin for AUC of 5, Alimta 500 Mg/M2 and Keytruda 200 Mg IV every 3 weeks starting September 28, 2021.  The patient has no actionable mutations and PD-L1 expression was 0%.  He also is not interested in the palliative care and hospice option.  I discussed with the patient the adverse effect of this treatment.  I will arrange for him to receive vitamin B12 injection while he is in the hospital in preparation for his chemotherapy. I will also call his pharmacy with prescription for folic acid 1 mg p.o. daily to start early next week. The patient will have a follow-up visit with me 1 week after the start of his treatment for evaluation and management of any adverse effect of his treatment 4) for pain management he will continue with his outpatient regimen with MS Contin as well as MS IR and he is followed by the palliative care team at the cancer center.  I expect his pain to improve after completion of the palliative radiotherapy. The patient and his wife are in agreement with the current plan. Thank you so much for taking good care of Mr. Frappier.  Please call if you have any questions  LOS: 1 day    Eilleen Kempf 09/17/2021

## 2021-09-17 NOTE — Telephone Encounter (Signed)
Pt called (while inpt at [facility]) very upset and agitated stating he doesn't feel his tx is being "taken seriously or treated aggressively". Pt states we "are just sitting on treating" him and wants to start chemo right now. Pt also stated he didn't feel I was "on his team anymore".  I reassured the pt multiple times that we are and will do everything we can to help him. Pt understands that he was advised during his 09/09/21 OV with Dr. [name], to continue his radiation for now and he would discuss additional systemic chemo at his February appt (once radiation has completed). Pt understands he needs to complete radiation before we start chemo. Pt understands his radiation isn't complete until 09/23/21 and then his next course of tx will start. Pt became increasingly agitated and stated "radiation isn't doing anything and wants the cancer cut out or chemo to be started". Pt feels waiting until his February appt is "too long to wait". I advised the pt I will relay this information to Dr. [name] and again, pt has been reassured multiple times and states he understands now and thanked me for my patience and help and requests we "forget the whole interaction and start over" and "he is sorry for getting upset but I am on a lot of medications right now". I advised the pt  I understood him and not to worry himself about being upset and afraid because it is very understandable.   This interaction has been relayed to Dr. [name] and he plans to go over to the hospital to see the pt to discuss things further.

## 2021-09-17 NOTE — Consult Note (Signed)
Palliative Care Consult Note                                  Date: 09/17/2021   Patient Name: [name]  DOB: Mar 12, 1956  MRN: [id number]  Age / Sex: 66 y.o., male  PCP: [name], MD Referring Physician: Dwyane Dee, MD  Reason for Consultation: Pain control  HPI/Patient Profile: 66 y.o. 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  Also has history of chronic pain, CAD s/p CABG, HLD, HTN, PE on Xarelto, fibromyalgia, depression.  Patient presented with worsening right-sided chest wall pain radiating to his back. CT angio chest showed no PE.  There was mild interval increase in size of previously noted left upper lobe nodule with multiple new bilateral noncalcified lung nodules consistent with pulmonary metastasis.  Also interval enlargement of soft tissue mass seen within the right posterior chest wall with pleural invasion along right lung base.  Also multiple right-sided lytic lesions with pathologic fracture involving right 11th rib. He was admitted for pain control.   PMT was consulted for Pain management  Past Medical History:  Diagnosis Date   Anemia    Anxiety    Arthritis    Basal cell carcinoma (BCC) of forehead    CAD (coronary artery disease)    a. 10/2015 ant STEMI >> LHC with 3 v CAD; oLAD tx with POBA >> emergent CABG. b. Multiple evals since that time, early graft failure of SVG-RCA by cath 03/2016. c. 2/19 PCI/DES x1 to pRCA, normal EF.   Carotid artery disease ([place])    a. 40-59% BICA 02/2018.   Depression    Dyspnea    Ectopic atrial tachycardia (HCC)    Esophageal reflux    eosinophil esophagitis   Family history of adverse reaction to anesthesia    "sister has PONV" (06/21/2017)   Former tobacco use    Gout    Hepatitis C    "treated and cured" (06/21/2017)   High cholesterol    History of blood transfusion    History of kidney stones     Hypertension    Ischemic cardiomyopathy    a. EF 25-30% at intraop TEE 4/17  //  b. Limited Echo 5/17 - EF 45-50%, mild ant HK. c. EF 55-65% by cath 09/2017.   Migraine    "3-4/yr" (06/21/2017)   Myocardial infarction ([place]) 10/2015   Palpitations    Pneumonia    Sinus bradycardia    a. HR dropping into 40s in 02/2016 -> BB reduced.   Stroke [facility]) 10/2016   "small one; sometimes my memory/cognitive issues" (06/21/2017)   Symptomatic hypotension    a. 02/2016 ER visit -> meds reduced.   Syncope    Wears dentures    Wears glasses     Subjective:   This NP Walden Field reviewed medical records, received report from team, assessed the patient and then meet at the patient's bedside to discuss diagnosis, prognosis, GOC, EOL wishes disposition and options.  I met with the patient and his wife at the bedside.   Concept of Palliative Care was introduced as specialized medical care for people and their families living with serious illness.  If focuses on providing relief from the symptoms and stress of a serious illness.  The goal is to improve quality of life for both the patient and the family. Values and goals  of care important to patient and family were attempted to be elicited.  Created space and opportunity for patient  and family to explore thoughts and feelings regarding current medical situation   Natural trajectory and current clinical status were discussed. Questions and concerns addressed. Patient  encouraged to call with questions or concerns.    Today's Discussion: We had an extensive discussion about his pain today. Yesterday he was doing relatively well with this, but last night began having sevre breakthrough on his right chest wall. He knows he has a borken rib. Today his pain has been off/on. Right now it is 8/10 (and he has called for a dose of pain medication). At it's worst his pain is 10/10. During the day it has been between 4-8/10. He understands it's not possible to  completely remove his pain but our goal is to make it manageable. He's in agreement with this concept. His pain goal is 4/10. We discussed his home pain medications and that now he is hurting more and had to be admitted. Discussed the nature of cancer and the complexity of cancer pain. We discussed starting a different pain management regimen that can be taken at home.  We had an good discussion about changing his Morphine to Methadone; we would keep his IV dilaudid for breakthrough while methadone is getting on board, but increase the dosing interval. He is in agreement.  Provided emotional and general support   Review of Systems  Constitutional:  Negative for activity change.  Cardiovascular:  Positive for chest pain (chest wall pain).  Gastrointestinal:  Negative for abdominal pain, nausea and vomiting.   Objective:   Primary Diagnoses: Present on Admission:  Cancer associated pain  Fibromyalgia  Depression  Essential hypertension  Hyperlipidemia LDL goal <70  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HCC)  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HCC)  History of pulmonary embolus (PE)  Protein-calorie malnutrition, moderate (HCC)  Constipation   Physical Exam Vitals and nursing note reviewed.  Constitutional:      General: He is not in acute distress.    Appearance: He is ill-appearing.  HENT:     Head: Normocephalic and atraumatic.  Cardiovascular:     Rate and Rhythm: Normal rate.  Pulmonary:     Effort: No respiratory distress.     Breath sounds: No wheezing or rhonchi.  Abdominal:     General: Abdomen is flat.     Palpations: Abdomen is soft.  Skin:    General: Skin is warm and dry.  Neurological:     General: No focal deficit present.     Mental Status: He is alert.  Psychiatric:        Mood and Affect: Mood normal.        Behavior: Behavior normal.    Vital Signs:  BP 100/68 (BP Location: Left Arm)    Pulse 98    Temp 97.8 F (36.6 C) (Oral)     Resp 20    Ht _0  (1.753 m)    Wt 78.5 kg    SpO2 98%    BMI 25.55 kg/m   Palliative Assessment/Data: 50-60%    Advanced Care Planning:   Primary Decision Maker: PATIENT  Code Status/Advance Care Planning: Limited code  Decisions/Changes to ACP: None today  Assessment & Plan:   Impression: 66 year old male with metastatic cancer and complicated cancer pain followed by PMT/[facility] symptom management clinic. He was admitted for pain management. Discussed current home regimen. He has a realistic pain  goal of 4/10. Has been MS Contin for pain (135 mg/last 24 hrs), MS IR for breakthrough (120 mg/last 24 hours) and IV dilaudid for severe pain (10 mg/last 25 hours). Discussed other options for home pain management. I feel he's a good candidate for Methadone and we discussed it extensively. He is agreeable to try.  SUMMARY OF RECOMMENDATIONS   Changes in pain management regimen as per below Remain partial code Further [place] discussions after follow-up with oncology PMT will continue to follow  Symptom Management:  Stop MS Contin, MS IR Change Dilaudid to 1 mg IV q 4 hrs prn Start Methadone 5 mg po bid Further adjustments to pain regimen anticipated tomorrow (start transition of IV dilaudid to po dilaudid) Can consider increase in Methadone to 5 mg q 8 hr after 3 days  Prognosis:  Unable to determine  Discharge Planning:  To Be Determined   Discussed with: Medical team, nursing team, patient, patient's family   Thank you for allowing [name] to participate in the care of TIGE MEAS PMT will continue to support holistically.  Time Total: 120 min  Greater than 50%  of this time was spent counseling and coordinating care related to the above assessment and plan.  Signed by: Walden Field, NP Palliative Medicine Team  Team Phone # [phone] (Nights/Weekends)  09/17/2021, 3:46 PM

## 2021-09-18 ENCOUNTER — Ambulatory Visit
Admission: RE | Admit: 2021-09-18 | Discharge: 2021-09-18 | Disposition: A | Discharge status: Home / Self Care | Payer: Medicare Other | Source: Ambulatory Visit | Attending: Radiation Oncology | Admitting: Radiation Oncology

## 2021-09-18 ENCOUNTER — Ambulatory Visit: Payer: Medicare Other

## 2021-09-18 DIAGNOSIS — Z7189 Other specified counseling: Secondary | ICD-10-CM

## 2021-09-18 DIAGNOSIS — Z86711 Personal history of pulmonary embolism: Secondary | ICD-10-CM

## 2021-09-18 MED ORDER — HYDROMORPHONE HCL 1 MG/ML IJ SOLN
0.5000 mg | Freq: Once | INTRAMUSCULAR | Status: DC
Start: 2021-09-18 — End: 2021-09-18

## 2021-09-18 MED ORDER — HYDROMORPHONE HCL 2 MG PO TABS
4.0000 mg | ORAL_TABLET | ORAL | Status: DC | PRN
  Administered 2021-09-18 – 2021-09-20 (×9): 4 mg via ORAL
  Filled 2021-09-18 (×9): qty 2

## 2021-09-18 MED ORDER — ONDANSETRON HCL 4 MG/2ML IJ SOLN: 4.0000 mg | Freq: Four times a day (QID) | INTRAMUSCULAR | Status: DC | PRN

## 2021-09-18 MED ORDER — ONDANSETRON HCL 4 MG/2ML IJ SOLN
4.0000 mg | INTRAMUSCULAR | Status: DC | PRN
  Administered 2021-09-18: 4 mg via INTRAVENOUS
  Filled 2021-09-18: qty 2

## 2021-09-18 MED ORDER — HYDROMORPHONE HCL 1 MG/ML IJ SOLN
1.0000 mg | Freq: Four times a day (QID) | INTRAMUSCULAR | Status: DC | PRN
  Administered 2021-09-18 – 2021-09-20 (×7): 1 mg via INTRAVENOUS
  Filled 2021-09-18 (×7): qty 1

## 2021-09-18 MED ORDER — ONDANSETRON HCL 4 MG PO TABS: 4.0000 mg | ORAL_TABLET | ORAL | Status: DC | PRN

## 2021-09-18 MED ORDER — MORPHINE SULFATE ER 30 MG PO TBCR
60.0000 mg | EXTENDED_RELEASE_TABLET | Freq: Three times a day (TID) | ORAL | Status: DC
  Administered 2021-09-18 – 2021-09-20 (×6): 60 mg via ORAL
  Filled 2021-09-18 (×6): qty 2

## 2021-09-18 MED ORDER — ONDANSETRON HCL 4 MG PO TABS
4.0000 mg | ORAL_TABLET | ORAL | Status: DC | PRN
  Administered 2021-09-18 – 2021-09-20 (×3): 4 mg via ORAL
  Filled 2021-09-18 (×3): qty 1

## 2021-09-18 MED ORDER — HYDROMORPHONE HCL 1 MG/ML IJ SOLN
1.0000 mg | Freq: Once | INTRAMUSCULAR | Status: AC
  Administered 2021-09-18: 1 mg via INTRAVENOUS

## 2021-09-18 NOTE — Progress Notes (Signed)
Progress Note    CATO LIBURD   DDU:[id number]  DOB: 1956-07-07  DOA: 09/14/2021     2 PCP: [name], MD  Initial CC: pain  Hospital Course: GREGORIO WORLEY is a 66 y.o. 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  Also has history of chronic pain, CAD s/p CABG, HLD, HTN, PE on Xarelto, fibromyalgia, depression.  Patient presented with worsening right-sided chest wall pain radiating to his back. CT angio chest showed no PE.  There was mild interval increase in size of previously noted left upper lobe nodule with multiple new bilateral noncalcified lung nodules consistent with pulmonary metastasis.  Also interval enlargement of soft tissue mass seen within the right posterior chest wall with pleural invasion along right lung base.  Also multiple right-sided lytic lesions with pathologic fracture involving right 11th rib. He was admitted for pain control.  Interval History:  No events overnight.  He was more calm today after having gotten to talk to Dr. [name] yesterday. Still endorsing uncontrolled pain this morning and was not tolerating the switch to methadone.  Palliative care plans to further adjust today.   Assessment & Plan: * Cancer associated pain- (present on admission) - Fibromyalgia. Associated with metastatic lung cancer as well as neuroendocrine carcinoma. - Currently undergoing radiation therapy for the same. 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 [name] - unfortunately he does exhibit poor control of his emotions in general (better today however) but he appears to have difficulty with acceptance in general - palliative care has been following closely and he will continue with palliative discussions back at the cancer center upon discharge as well   History of pulmonary embolus (PE)- (present on admission) On Xarelto. Will continue.  Hyperlipidemia LDL goal <70- (present on admission) - Last LDL 28 on 12/27/2020 -Given underlying malignancy with progression, patient has no mortality benefit for continuing statin - DC Lipitor  Constipation- (present on admission) Continue Senokot, MiraLAX.  Protein-calorie malnutrition, moderate ([place])- (present on admission) Continue nutritional supplements.  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[place])- (present on admission)    Essential hypertension- (present on admission) - Continue Toprol  S/P CABG x 5 CAD with CABG. On aspirin.   Depression- (present on admission) Continue Cymbalta.    Old records reviewed in assessment of this patient  Antimicrobials:   DVT prophylaxis: Xarelto  Code Status:   Code Status: Partial Code  Disposition Plan:  Home Status is: Inpt  Objective: Blood pressure 96/64, pulse 73, temperature 98 F (36.7 C), temperature source Oral, resp. rate 18, height 5\' 9"  (1.753 m), weight 78.5 kg, SpO2 98 %.  Examination:  Physical Exam Constitutional:      Appearance: Normal appearance.  HENT:     Head: Normocephalic and atraumatic.     Mouth/Throat:  Mouth: Mucous membranes are moist.  Eyes:     Extraocular Movements: Extraocular movements intact.  Cardiovascular:      Rate and Rhythm: Normal rate and regular rhythm.     Heart sounds: Normal heart sounds.  Pulmonary:     Effort: Pulmonary effort is normal. No respiratory distress.     Breath sounds: Normal breath sounds. No wheezing.  Abdominal:     General: Bowel sounds are normal. There is no distension.     Palpations: Abdomen is soft.     Tenderness: There is no abdominal tenderness.  Musculoskeletal:        General: Normal range of motion.     Cervical back: Normal range of motion and neck supple.  Skin:    General: Skin is warm and dry.  Neurological:     Mental Status: He is alert and oriented to person, place, and time.  Psychiatric:        Mood and Affect: Mood normal.        Behavior: Behavior normal.     Consultants:  Oncology Palliative Care  Procedures:    Data Reviewed: I have personally reviewed labs and imaging studies    LOS: 2 days  Time spent: Greater than 50% of the 35 minute visit was spent in counseling/coordination of care for the patient as laid out in the A&P.   Dwyane Dee, MD [facility] 09/18/2021, 1:59 PM

## 2021-09-18 NOTE — Progress Notes (Signed)
Daily Progress Note   Patient Name: [name]       Date: 09/18/2021 DOB: 1956-05-31  Age: 66 y.o. MRN#: [id number] Attending Physician: Dwyane Dee, MD Primary Care Physician: [name], MD Admit Date: 09/14/2021 Length of Stay: 2 days  Reason for Consultation/Follow-up: Establishing goals of care  HPI/Patient Profile:  66 y.o. 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  Also has history of chronic pain, CAD s/p CABG, HLD, HTN, PE on Xarelto, fibromyalgia, depression.  Patient presented with worsening right-sided chest wall pain radiating to his back. CT angio chest showed no PE.  There was mild interval increase in size of previously noted left upper lobe nodule with multiple new bilateral noncalcified lung nodules consistent with pulmonary metastasis.  Also interval enlargement of soft tissue mass seen within the right posterior chest wall with pleural invasion along right lung base.  Also multiple right-sided lytic lesions with pathologic fracture involving right 11th rib. He was admitted for pain control.    PMT was consulted for Pain management  Subjective:   Subjective: Chart Reviewed. Updates received. Patient Assessed. Created space and opportunity for patient  and family to explore thoughts and feelings regarding current medical situation.  Today's Discussion: I had a discussion with the patient at the bedside today.  He feels his pain is better managed.  He was having some more significant nausea today.  I offered elective decreasing interval of the Zofran to see if this helps.  Zofran is generally upright and effective and we can look at other options.  I would avoid Phenergan due to potentiation of opioids.  However, Compazine could be an option as well.  Otherwise he feels his pain is doing better.  We discussed our goal to get him out of the hospital so that he can make his appointment at [facility] on Monday.  He  is in agreement.  I offered general emotional support through therapeutic listening, empathy, and other techniques.  I answered all questions and addressed all concerns at this time.  Review of Systems  Constitutional:  Negative for fatigue.  Cardiovascular:  Positive for chest pain (improved).  Gastrointestinal:  Negative for abdominal pain, nausea and vomiting.   Objective:   Vital Signs:  BP 96/64 (BP Location: Left Arm)    Pulse 73    Temp 98 F (36.7 C) (Oral)    Resp 18    Ht 5\' 9"  (1.753 m)    Wt 78.5 kg    SpO2 98%    BMI 25.55 kg/m   Physical Exam: Physical Exam Vitals and nursing note reviewed.  Constitutional:      General: He is not in acute distress.    Appearance: He is ill-appearing.  HENT:     Head: Normocephalic and atraumatic.  Cardiovascular:     Rate and Rhythm: Normal rate.  Pulmonary:     Effort: Pulmonary effort is normal. No respiratory distress.     Breath sounds: No wheezing or rhonchi.  Abdominal:     General: Abdomen is flat.     Palpations: Abdomen is soft.  Skin:    General: Skin is warm and dry.  Neurological:     General: No focal deficit present.     Mental Status: He is alert.  Psychiatric:        Mood and Affect: Mood normal.        Behavior: Behavior normal.    Palliative Assessment/Data: 40-50%  Assessment & Plan:   Impression: Present on Admission:  Cancer associated pain  Fibromyalgia  Depression  Essential hypertension  Hyperlipidemia LDL goal <70  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HCC)  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HCC)  History of pulmonary embolus (PE)  Protein-calorie malnutrition, moderate (HCC)  Constipation  66 year old male with metastatic cancer and complicated cancer pain followed by PMT/[facility] symptom management clinic. He was admitted for pain management. Discussed current home regimen. He has a realistic pain goal of 4/10. Has been MS Contin for pain (135 mg/last 24 hrs), MS IR for  breakthrough (120 mg/last 24 hours) and IV dilaudid for severe pain (10 mg/last 25 hours). Discussed other options for home pain management. I feel he's a good candidate for Methadone and we discussed it extensively and he was agreeable to try. However overnight he called the PMT phone that it's not working and asked to be changed back to something else. Changed regimen as per below.  SUMMARY OF RECOMMENDATIONS   Change pain regimen to d/c methadone Change nausea medication as per below Plan continued titration to get the patient off IV Dilaudid to help with timely d/c to make appointment at [facility] Monday PMT will continue to follow  Symptom Management:  D/C methadone Restart MS Contin at increased dose of 60 mg po q 8 hrs Start po dilaudid 4 mg q 3 hrs prn severe breakthrough pain Keep IV dilaudid at reduced interval of 1 mg q 6 hrs prn Decrease Zofran interval to q 4 hours Can consider adding another antiemetic if Zofran frankly ineffective  Code Status: Limited code  Prognosis: Unable to determine  Discharge Planning: Home with Palliative Services  Discussed with: Medical team, nursing team, patient  Thank you for allowing [name] to participate in the care of JAMONI HEWES PMT will continue to support holistically.  MDM high: Acute illness posing threat to life, discussion of management plan with medical team and nursing team, ongoing for management of parenteral opioids.  Walden Field, NP Palliative Medicine Team  Team Phone # [phone] (Nights/Weekends)  04/28/2021, 8:17 AM

## 2021-09-19 NOTE — Progress Notes (Signed)
Daily Progress Note   Patient Name: [name]       Date: 09/19/2021 DOB: Jun 22, 1956  Age: 66 y.o. MRN#: [id number] Attending Physician: Dwyane Dee, MD Primary Care Physician: [name], MD Admit Date: 09/14/2021 Length of Stay: 3 days  Reason for Consultation/Follow-up: Establishing goals of care  HPI/Patient Profile:  66 y.o. 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  Also has history of chronic pain, CAD s/p CABG, HLD, HTN, PE on Xarelto, fibromyalgia, depression.  Patient presented with worsening right-sided chest wall pain radiating to his back. CT angio chest showed no PE.  There was mild interval increase in size of previously noted left upper lobe nodule with multiple new bilateral noncalcified lung nodules consistent with pulmonary metastasis.  Also interval enlargement of soft tissue mass seen within the right posterior chest wall with pleural invasion along right lung base.  Also multiple right-sided lytic lesions with pathologic fracture involving right 11th rib. He was admitted for pain control.    PMT was consulted for Pain management  Subjective:   Subjective: Chart Reviewed. Updates received. Patient Assessed. Created space and opportunity for patient  and family to explore thoughts and feelings regarding current medical situation.  Today's Discussion: I reviewed with the patient about his current pain regimen.  He has tolerated his breakfast, his pain is well controlled, medication history noted, is on 60 mg of MS Contin every 8 hours, is on oral Dilaudid 4 mg and that the patient has a had 3 doses of that in the past 24 hours, additionally he has had 3 doses of 1 mg of IV Dilaudid.  Patient is aware that he will not be getting IV Dilaudid outside of the hospital, he has an appointment at [facility] on Monday in case they have a clinical trial or experimental therapy for his type of cancer, offered active  listening and supportive care and other therapeutic techniques.     Review of Systems  Constitutional:  Negative for fatigue.  Cardiovascular:  Positive for chest pain (improved).  Gastrointestinal:  Negative for abdominal pain, nausea and vomiting.   Objective:   Vital Signs:  BP 101/73 (BP Location: Right Arm)    Pulse 71    Temp 98 F (36.7 C) (Oral)    Resp 20    Ht 5\' 9"  (1.753 m)    Wt 78.5 kg    SpO2 99%    BMI 25.55 kg/m   Physical Exam: Physical Exam Vitals and nursing note reviewed.  Constitutional:      General: He is not in acute distress. HENT:     Head: Normocephalic and atraumatic.  Cardiovascular:     Rate and Rhythm: Normal rate.  Pulmonary:     Effort: Pulmonary effort is normal. No respiratory distress.     Breath sounds: No wheezing or rhonchi.  Abdominal:     General: Abdomen is flat.     Palpations: Abdomen is soft.  Skin:    General: Skin is warm and dry.  Neurological:     General: No focal deficit present.     Mental Status: He is alert.  Psychiatric:        Mood and Affect: Mood normal.        Behavior: Behavior normal.    Palliative Assessment/Data: 40-50%   Assessment & Plan:   Impression: Present on Admission:  Cancer associated pain  Fibromyalgia  Depression  Essential hypertension  Hyperlipidemia LDL goal <  17  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HCC)  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HCC)  History of pulmonary embolus (PE)  Protein-calorie malnutrition, moderate (HCC)  Constipation  66 year old male with metastatic cancer and complicated cancer pain followed by PM[facility] symptom management clinic. He was admitted for pain management. Discussed current home regimen. He has a realistic pain goal of 4/10. Has been MS Contin for pain (135 mg/last 24 hrs), MS IR for breakthrough (120 mg/last 24 hours) and IV dilaudid for severe pain (10 mg/last 25 hours). Discussed other options for home pain management. I feel he's  a good candidate for Methadone and we discussed it extensively and he was agreeable to try. However overnight he called the PMT phone that it's not working and asked to be changed back to something else. Changed regimen as per below.  SUMMARY OF RECOMMENDATIONS   MS Contin at increased dose of 60 mg po q 8 hrs Start po dilaudid 4 mg q 3 hrs prn severe breakthrough pain Keep IV dilaudid at reduced interval of 1 mg q 6 hrs prn    Code Status: Limited code  Prognosis: Unable to determine  Discharge Planning: Home with Palliative Services  Discussed with:  patient  Thank you for allowing [name] to participate in the care of AMANDA POTE PMT will continue to support holistically.  MDM high: Acute illness posing threat to life, discussion of management plan with medical team and nursing team, ongoing for management of parenteral opioids.   Loistine Chance MD.  Palliative Medicine Team  Team Phone # [phone]

## 2021-09-19 NOTE — Progress Notes (Signed)
Progress Note    [name]   ZOX:[id number]  DOB: 02-25-1956  DOA: 09/14/2021     3 PCP: [name], MD  Initial CC: pain  Hospital Course: [name] is a 66 y.o. 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  Also has history of chronic pain, CAD s/p CABG, HLD, HTN, PE on Xarelto, fibromyalgia, depression.  Patient presented with worsening right-sided chest wall pain radiating to his back. CT angio chest showed no PE.  There was mild interval increase in size of previously noted left upper lobe nodule with multiple new bilateral noncalcified lung nodules consistent with pulmonary metastasis.  Also interval enlargement of soft tissue mass seen within the right posterior chest wall with pleural invasion along right lung base.  Also multiple right-sided lytic lesions with pathologic fracture involving right 11th rib. He was admitted for pain control.  Interval History:  He had some breakthrough pain overnight and was given IV Dilaudid.  Plan is still for discharging home tomorrow so that he can make his Monday appointment at [facility].  Assessment & Plan: * Cancer associated pain- (present on admission) - Fibromyalgia. Associated with metastatic lung cancer as well as neuroendocrine carcinoma. - Currently undergoing radiation therapy for the same. 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 [name] - unfortunately he does exhibit poor control of his emotions in general (better today however) but he appears to have difficulty with acceptance in general - palliative care has been following closely and he will continue with palliative discussions back at the cancer center upon discharge as well   History of pulmonary embolus (PE)- (present on admission) On Xarelto. Will continue.  Hyperlipidemia LDL goal <70- (present on admission) - Last LDL 28 on 12/27/2020 -Given underlying malignancy with progression, patient has no mortality benefit for continuing statin - DC Lipitor  Constipation- (present on admission) Continue Senokot, MiraLAX.  Protein-calorie malnutrition, moderate ([place])- (present on admission) Continue nutritional supplements.  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[place])- (present on admission)    Essential hypertension- (present on admission) - Continue Toprol  S/P CABG x 5 CAD with CABG. On aspirin.   Depression- (present on admission) Continue Cymbalta.    Old records reviewed in assessment of this patient  Antimicrobials:   DVT prophylaxis: Xarelto  Code Status:   Code Status: Partial Code  Disposition Plan:  Home Status is: Inpt  Objective: Blood pressure 101/73, pulse 71, temperature 98 F (36.7 C), temperature source Oral, resp. rate 20, height 5\' 9"  (1.753 m), weight 78.5 kg, SpO2 99 %.  Examination:  Physical Exam Constitutional:      Appearance: Normal appearance.  HENT:     Head: Normocephalic and atraumatic.     Mouth/Throat:     Mouth: Mucous membranes are moist.  Eyes:     Extraocular Movements: Extraocular movements intact.  Cardiovascular:     Rate and Rhythm: Normal rate and regular rhythm.     Heart sounds:  Normal heart sounds.  Pulmonary:     Effort: Pulmonary effort is normal. No respiratory distress.     Breath sounds: Normal breath sounds. No wheezing.  Abdominal:     General: Bowel sounds are normal. There is no distension.     Palpations: Abdomen is soft.     Tenderness: There is no abdominal tenderness.  Musculoskeletal:        General: Normal range of motion.     Cervical back: Normal range of motion and neck supple.  Skin:    General: Skin is warm and dry.  Neurological:     Mental Status: He is alert and oriented to person, place, and time.  Psychiatric:        Mood and Affect: Mood normal.        Behavior: Behavior normal.     Consultants:  Oncology Palliative Care  Procedures:    Data Reviewed: I have personally reviewed labs and imaging studies    LOS: 3 days  Time spent: Greater than 50% of the 35 minute visit was spent in counseling/coordination of care for the patient as laid out in the A&P.   Dwyane Dee, MD [facility] 09/19/2021, 12:53 PM

## 2021-09-19 NOTE — Plan of Care (Addendum)
Pt's pain well-controlled overnight. Pt concerned that he is having more uncontrolled movements relating to fine motor skills. Problem: Education: Goal: Knowledge of General Education information will improve Description: Including pain rating scale, medication(s)/side effects and non-pharmacologic comfort measures Outcome: Progressing   Problem: Health Behavior/Discharge Planning: Goal: Ability to manage health-related needs will improve Outcome: Progressing   Problem: Clinical Measurements: Goal: Ability to maintain clinical measurements within normal limits will improve Outcome: Progressing Goal: Will remain free from infection Outcome: Progressing Goal: Diagnostic test results will improve Outcome: Progressing Goal: Respiratory complications will improve Outcome: Progressing Goal: Cardiovascular complication will be avoided Outcome: Progressing   Problem: Activity: Goal: Risk for activity intolerance will decrease Outcome: Progressing   Problem: Nutrition: Goal: Adequate nutrition will be maintained Outcome: Progressing   Problem: Coping: Goal: Level of anxiety will decrease Outcome: Progressing   Problem: Elimination: Goal: Will not experience complications related to bowel motility Outcome: Progressing Goal: Will not experience complications related to urinary retention Outcome: Progressing   Problem: Pain Managment: Goal: General experience of comfort will improve Outcome: Progressing   Problem: Safety: Goal: Ability to remain free from injury will improve Outcome: Progressing   Problem: Skin Integrity: Goal: Risk for impaired skin integrity will decrease Outcome: Progressing   Problem: Bowel/Gastric: Goal: Will not experience complications related to bowel motility Outcome: Progressing   Problem: Coping: Goal: Ability to identify and develop effective coping behavior will improve Outcome: Progressing

## 2021-09-20 ENCOUNTER — Encounter: Payer: Self-pay | Admitting: Internal Medicine

## 2021-09-20 DIAGNOSIS — K5900 Constipation, unspecified: Secondary | ICD-10-CM

## 2021-09-20 MED ORDER — RIVAROXABAN 20 MG PO TABS: 20.0000 mg | ORAL_TABLET | Freq: Every day | ORAL | Status: DC

## 2021-09-20 MED ORDER — HYDROMORPHONE HCL 4 MG PO TABS
4.0000 mg | ORAL_TABLET | ORAL | 0 refills | Status: DC | PRN
  Filled 2021-09-20: qty 56, 7d supply, fill #0

## 2021-09-20 MED ORDER — MORPHINE SULFATE ER 60 MG PO TBCR
60.0000 mg | EXTENDED_RELEASE_TABLET | Freq: Three times a day (TID) | ORAL | 0 refills | Status: DC
  Filled 2021-09-20: qty 42, 14d supply, fill #0

## 2021-09-20 MED ORDER — HEPARIN SOD (PORK) LOCK FLUSH 100 UNIT/ML IV SOLN
500.0000 [IU] | Freq: Once | INTRAVENOUS | Status: AC
  Administered 2021-09-20: 500 [IU] via INTRAVENOUS
  Filled 2021-09-20 (×2): qty 5

## 2021-09-20 NOTE — Progress Notes (Signed)
PIV removed. Port de-accessed. AVS reviewed. Patient verbalizes understanding of medication regimen and pain management goals. Patient expresses anxiety surrounding visit to [facility] tomorrow. Emotional support provided.

## 2021-09-20 NOTE — Progress Notes (Signed)
Daily Progress Note   Patient Name: [name]       Date: 09/20/2021 DOB: [birth year]  Age: 66 y.o. MRN#: [id number] Attending Physician: Dwyane Dee, MD Primary Care Physician: [name], MD Admit Date: 09/14/2021 Length of Stay: 4 days  Reason for Consultation/Follow-up: Establishing goals of care  HPI/Patient Profile:  67 y.o. 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  Also has history of chronic pain, CAD s/p CABG, HLD, HTN, PE on Xarelto, fibromyalgia, depression.  Patient presented with worsening right-sided chest wall pain radiating to his back. CT angio chest showed no PE.  There was mild interval increase in size of previously noted left upper lobe nodule with multiple new bilateral noncalcified lung nodules consistent with pulmonary metastasis.  Also interval enlargement of soft tissue mass seen within the right posterior chest wall with pleural invasion along right lung base.  Also multiple right-sided lytic lesions with pathologic fracture involving right 11th rib. He was admitted for pain control.    PMT was consulted for Pain management  Subjective:   Subjective: Chart Reviewed. Updates received. Patient Assessed. Created space and opportunity for patient  and family to explore thoughts and feelings regarding current medical situation.  Today's Discussion: I reviewed with the patient about his current pain regimen.  He has tolerated his breakfast, his pain is well controlled, medication history noted, is on 60 mg of MS Contin every 8 hours, is on oral Dilaudid 4 mg and that the patient has a had 3 doses of that in the past 24 hours, additionally he has had 3 doses of 1 mg of IV Dilaudid.  Patient is aware that he will not be getting IV Dilaudid outside of the hospital, he has an appointment at [facility] on Monday in case they have a clinical trial or experimental therapy for his type of cancer, offered active  listening and supportive care and other therapeutic techniques.     Review of Systems  Constitutional:  Negative for fatigue.  Cardiovascular:  Chest pain: improved.  Gastrointestinal:  Negative for abdominal pain, nausea and vomiting.   Objective:   Vital Signs:  BP 104/63 (BP Location: Right Arm)    Pulse 66    Temp 97.9 F (36.6 C) (Oral)    Resp 20    Ht 5\' 9"  (1.753 m)    Wt 78.5 kg    SpO2 99%    BMI 25.55 kg/m   Physical Exam: Physical Exam Vitals and nursing note reviewed.  Constitutional:      General: He is not in acute distress. HENT:     Head: Normocephalic and atraumatic.  Cardiovascular:     Rate and Rhythm: Normal rate.  Pulmonary:     Effort: Pulmonary effort is normal. No respiratory distress.     Breath sounds: No wheezing or rhonchi.  Abdominal:     General: Abdomen is flat.     Palpations: Abdomen is soft.  Skin:    General: Skin is warm and dry.  Neurological:     General: No focal deficit present.     Mental Status: He is alert.  Psychiatric:        Mood and Affect: Mood normal.        Behavior: Behavior normal.    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 He was admitted for pain management. Discussed current home regimen. He has a realistic pain goal of 4/10. Pain regimen as per below.  SUMMARY OF RECOMMENDATIONS   MS Contin at   of 60 mg po q 8 hrs  po dilaudid 4 mg q 3 hrs prn severe breakthrough pain Keep IV dilaudid at reduced interval of 1 mg q 6 hrs prn  Discussed  with patient about discharge today, offered active listening and supportive presence, patient has an appointment at [facility].   Code Status: Limited code  Prognosis: Unable to determine  Discharge Planning: Home with Palliative Services  Discussed with:  patient and TRH MD.   Thank you for allowing [name] to participate in the care of SHOTA KOHRS PMT will continue to support holistically.  MDM high: Acute illness posing threat to life, discussion of management plan with medical team and nursing team, ongoing for management of parenteral opioids.   Loistine Chance MD.  Palliative Medicine Team  Team Phone # [phone]

## 2021-09-20 NOTE — Progress Notes (Signed)
[facility] ([facility])  Hospital Liaison: RN note         This patient has been referred to our palliative care services in the community.  [facility] will continue to follow for any discharge planning needs and to coordinate continuation of palliative care in the outpatient setting.    If you have questions or need assistance, please call [phone] or contact the hospital Liaison listed on AMION.      Thank you for this referral.         Mary Anne Robertson, RN, CCM  [facility] Hospital Liaison   [phone] 

## 2021-09-20 NOTE — TOC Transition Note (Signed)
Transition of Care [facility]) - CM/SW Discharge Note   Patient Details  Name: [name] MRN: [id number] Date of Birth: 1956/06/23  [facility] CM/SW Contact:  Ross Ludwig, LCSW Phone Number: 09/20/2021, 10:51 AM   Clinical Narrative:    CSW was informed that palliative is recommending outpatient palliative.  CSW contacted [facility], and gave referral for outpatient palliative.  CSW to sign off, please reconsult with any social work needs.   Final next level of care: Home/Self Care Barriers to Discharge: Barriers Resolved   Patient Goals and CMS Choice Patient states their goals for this hospitalization and ongoing recovery are:: To return back home.      Discharge Placement                       Discharge Plan and Services                                     Social Determinants of Health (SDOH) Interventions     Readmission Risk Interventions Readmission Risk Prevention Plan 08/21/2021 08/01/2021 03/25/2021  Transportation Screening Complete Complete Complete  Medication Review [employer] photographer) Complete Complete Complete  PCP or Specialist appointment within 3-5 days of discharge Complete Complete Complete  HRI or Home Care Consult Complete Complete Complete  SW Recovery Care/Counseling Consult Complete Complete Complete  SW Consult Not Complete Comments n/a - -  Palliative Care Screening Not Applicable Complete Not [place] Not Applicable Not Applicable Not Applicable  Some recent data might be hidden

## 2021-09-20 NOTE — Discharge Summary (Signed)
Physician Discharge Summary   JALENE LACKO JXB:[id number] DOB: 10-07-1955 DOA: 09/14/2021  PCP: [name], MD  Admit date: 09/14/2021 Discharge date: 09/20/2021   Admitted From: Home Disposition:  Home Discharging physician: Dwyane Dee, MD  Recommendations for Outpatient Follow-up:  Follow up with oncology Follow up with palliative care   Discharge Condition: stable CODE STATUS: Partial, no intubation  Diet recommendation:  Diet Orders (From admission, onward)     Start     Ordered   09/20/21 0000  Diet general        09/20/21 1043   09/15/21 1754  Diet regular Room service appropriate? Yes; Fluid consistency: Thin  Diet effective now       Question Answer Comment  Room service appropriate? Yes   Fluid consistency: Thin      09/15/21 [facility]: SHED NIXON is a 66 y.o. 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  Also has history of chronic pain, CAD s/p CABG, HLD, HTN, PE on Xarelto, fibromyalgia, depression.  Patient presented with worsening right-sided chest wall pain radiating to his back. CT angio chest showed no PE.  There was mild interval increase in size of previously noted left upper lobe nodule with multiple new bilateral noncalcified lung nodules consistent with pulmonary metastasis.  Also interval enlargement of soft tissue mass seen within the right posterior chest wall with pleural invasion along right lung base.  Also multiple right-sided lytic lesions with pathologic fracture involving right 11th rib. He was admitted for pain control. See below for further problem based plan.   * Cancer associated pain- (present on admission) - Fibromyalgia. Associated with metastatic lung cancer as well as neuroendocrine carcinoma. - Currently undergoing radiation therapy for the same. 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 [name] - unfortunately he does exhibit poor control of his emotions in general and he appears to have difficulty with acceptance in general - palliative care has been following closely and he will continue with palliative discussions back at the cancer center upon discharge as well   History of pulmonary embolus (PE)- (present on admission) On Xarelto. Will continue.  Hyperlipidemia LDL goal <70- (present on admission) - Last LDL 28 on 12/27/2020 -Given underlying malignancy with progression, patient has no mortality benefit for continuing statin - DC Lipitor  Constipation- (present on admission) - patient encouraged to continue laxative at discharge while on chronic opioids   Protein-calorie malnutrition, moderate ([place])- (present on admission) Continue nutritional supplements.  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[place])- (present on admission)    Essential hypertension- (present on admission) - Continue Toprol  S/P CABG x 5 CAD with CABG. On aspirin.   Depression- (present on admission) Continue Cymbalta.    The patient's chronic medical conditions were treated accordingly per the patient's home  medication regimen except as noted.  On day of discharge, patient was felt deemed stable for discharge. Patient/family member advised to call PCP or come back to ER if  needed.   Principal Diagnosis: Cancer associated pain  Discharge Diagnoses: Principal Problem:   Cancer associated pain Active Problems:   Goals of care, counseling/discussion   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HCC)   History of pulmonary embolus (PE)   Hyperlipidemia LDL goal <70   Fibromyalgia   Depression   S/P CABG x 5   Essential hypertension   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HCC)   Protein-calorie malnutrition, moderate (HCC)   Port-A-Cath in place   Constipation   Discharge Instructions     Diet general   Complete by: As directed    Increase activity slowly   Complete by: As directed       Allergies as of 09/20/2021       Reactions   Indomethacin Other (See Comments)   rectal bleeding   Prednisone Other (See Comments)   States that this med makes him "crazy"   Tetanus Toxoids Swelling, Other (See Comments)   Fever, Swelling of the arm    Wellbutrin [bupropion] Other (See Comments)   Crazy thoughts, nightmares   Varenicline Other (See Comments)   Dreams Other reaction(s): Unknown        Medication List     STOP taking these medications    atorvastatin 80 MG tablet Commonly known as: LIPITOR   midodrine 2.5 MG tablet Commonly known as: PROAMATINE       TAKE these medications    acetaminophen 500 MG tablet Commonly known as: TYLENOL Take 1,000 mg by mouth every 6 (six) hours as needed for mild pain, fever or headache.   aspirin EC 81 MG tablet Take 81 mg by mouth daily. Swallow whole.   docusate sodium 100 MG capsule Commonly known as: COLACE Take 100-200 mg by mouth daily.   DULoxetine HCl 40 MG Cpep Take 40 mg by mouth every evening.   folic acid 1 MG tablet Commonly known as: FOLVITE Take 1 tablet (1 mg total) by mouth daily.   gabapentin 100 MG capsule Commonly known as: NEURONTIN Take 1 capsule by mouth twice a day and 3 capsules at bedtime. What changed:  how much to take when to take  this additional instructions   HYDROmorphone 4 MG tablet Commonly known as: DILAUDID Take 1 tablet (4 mg total) by mouth every 3 (three) hours as needed for up to 7 days for severe pain.   lidocaine 5 % Commonly known as: Lidoderm Place 1 patch onto the skin daily. Remove & Discard patch within 12 hours or as directed by MD What changed:  when to take this reasons to take this   metoprolol succinate 25 MG 24 hr tablet Commonly known as: TOPROL-XL Take 1 tablet (25 mg total) by mouth daily. What changed: when to take this   morphine 60 MG 12 hr tablet Commonly known as: MS CONTIN Take 1 tablet (60 mg total) by mouth every 8 (eight) hours for 14 days. What changed:  medication strength how much to take Another medication with the same name was removed. Continue taking this medication, and follow the directions you see here.   multivitamin with minerals Tabs tablet Take 1 tablet by mouth daily.   ondansetron 8 MG tablet Commonly known as: ZOFRAN Take 1 tablet (8 mg total) by mouth every 8 (eight) hours as needed for nausea or vomiting. Starting  3 days after chemotherapy   polyethylene glycol 17 g packet Commonly known as: MIRALAX / GLYCOLAX Take 17 g by mouth daily.   ranolazine 1000 MG SR tablet Commonly known as: RANEXA Take 1 tablet (1,000 mg total) by mouth 2 (two) times daily.   tiZANidine 2 MG tablet Commonly known as: ZANAFLEX Take 1 tablet (2 mg total) by mouth at bedtime as needed for muscle spasms. What changed: when to take this   Xarelto 20 MG Tabs tablet Generic drug: rivaroxaban Take 20 mg by mouth every evening.        Allergies  Allergen Reactions   Indomethacin Other (See Comments)    rectal bleeding   Prednisone Other (See Comments)    States that this med makes him "crazy"   Tetanus Toxoids Swelling and Other (See Comments)    Fever, Swelling of the arm    Wellbutrin [Bupropion] Other (See Comments)    Crazy thoughts, nightmares    Varenicline Other (See Comments)    Dreams  Other reaction(s): Unknown    Consultations: Oncology Palliative Care  Discharge Exam: BP 104/63 (BP Location: Right Arm)    Pulse 66    Temp 97.9 F (36.6 C) (Oral)    Resp 20    Ht 5\' 9"  (1.753 m)    Wt 78.5 kg    SpO2 99%    BMI 25.55 kg/m  Physical Exam Constitutional:      Appearance: Normal appearance.  HENT:     Head: Normocephalic and atraumatic.     Mouth/Throat:     Mouth: Mucous membranes are moist.  Eyes:     Extraocular Movements: Extraocular movements intact.  Cardiovascular:     Rate and Rhythm: Normal rate and regular rhythm.     Heart sounds: Normal heart sounds.  Pulmonary:     Effort: Pulmonary effort is normal. No respiratory distress.     Breath sounds: Normal breath sounds. No wheezing.  Abdominal:     General: Bowel sounds are normal. There is no distension.     Palpations: Abdomen is soft.     Tenderness: There is no abdominal tenderness.  Musculoskeletal:        General: Normal range of motion.     Cervical back: Normal range of motion and neck supple.  Skin:    General: Skin is warm and dry.  Neurological:     Mental Status: He is alert and oriented to person, place, and time.  Psychiatric:        Mood and Affect: Mood normal.        Behavior: Behavior normal.     The results of significant diagnostics from this hospitalization (including imaging, microbiology, ancillary and laboratory) are listed below for reference.   Microbiology: Recent Results (from the past 240 hour(s))  Resp Panel by RT-PCR (Flu A&B, Covid) Nasopharyngeal Swab     Status: None   Collection Time: 09/15/21  1:16 AM   Specimen: Nasopharyngeal Swab; Nasopharyngeal(NP) swabs in vial transport medium  Result Value Ref Range Status   SARS Coronavirus 2 by RT PCR NEGATIVE NEGATIVE Final    Comment: (NOTE) SARS-CoV-2 target nucleic acids are NOT DETECTED.  The SARS-CoV-2 RNA is generally detectable in upper  respiratory specimens during the acute phase of infection. The lowest concentration of SARS-CoV-2 viral copies this assay can detect is 138 copies/mL. A negative result does not preclude SARS-Cov-2 infection and should not be used as the sole basis for treatment or other patient management decisions. A  negative result may occur with  improper specimen collection/handling, submission of specimen other than nasopharyngeal swab, presence of viral mutation(s) within the areas targeted by this assay, and inadequate number of viral copies(<138 copies/mL). A negative result must be combined with clinical observations, patient history, and epidemiological information. The expected result is Negative.  Fact Sheet for Patients:  EntrepreneurPulse.com.au  Fact Sheet for Healthcare Providers:  IncredibleEmployment.be  This test is no t yet approved or cleared by the [place] FDA and  has been authorized for detection and/or diagnosis of SARS-CoV-2 by FDA under an Emergency Use Authorization (EUA). This EUA will remain  in effect (meaning this test can be used) for the duration of the COVID-19 declaration under Section 564(b)(1) of the Act, 21 U.S.C.section 360bbb-3(b)(1), unless the authorization is terminated  or revoked sooner.       Influenza A by PCR NEGATIVE NEGATIVE Final   Influenza B by PCR NEGATIVE NEGATIVE Final    Comment: (NOTE) The Xpert Xpress SARS-CoV-2/FLU/RSV plus assay is intended as an aid in the diagnosis of influenza from Nasopharyngeal swab specimens and should not be used as a sole basis for treatment. Nasal washings and aspirates are unacceptable for Xpert Xpress SARS-CoV-2/FLU/RSV testing.  Fact Sheet for Patients: EntrepreneurPulse.com.au  Fact Sheet for Healthcare Providers: IncredibleEmployment.be  This test is not yet approved or cleared by the [place] FDA and has been  authorized for detection and/or diagnosis of SARS-CoV-2 by FDA under an Emergency Use Authorization (EUA). This EUA will remain in effect (meaning this test can be used) for the duration of the COVID-19 declaration under Section 564(b)(1) of the Act, 21 U.S.C. section 360bbb-3(b)(1), unless the authorization is terminated or revoked.  Performed at [facility], [address] [place], [place] [address]      Labs: BNP (last 3 results) Recent Labs    04/28/21 1751 06/04/21 0835 06/06/21 1736  BNP 95.2 53.0 67.3   Basic Metabolic Panel: Recent Labs  Lab 09/14/21 2336 09/16/21 0418  NA 139 135  K 4.5 4.5  CL 103 102  CO2 29 28  GLUCOSE 100* 105*  BUN 12 17  CREATININE 0.88 0.96  CALCIUM 10.3 10.2   Liver Function Tests: Recent Labs  Lab 09/14/21 2336 09/16/21 0418  AST 18 15  ALT 10 10  ALKPHOS 76 63  BILITOT 0.5 0.7  PROT 7.1 6.3*  ALBUMIN 3.6 3.4*   Recent Labs  Lab 09/14/21 2336  LIPASE 23   No results for input(s): AMMONIA in the last 168 hours. CBC: Recent Labs  Lab 09/14/21 2336 09/16/21 0418  WBC 6.3 6.5  NEUTROABS 4.5  --   HGB 11.5* 10.7*  HCT 35.6* 33.9*  MCV 92.2 93.9  PLT 227 217   Cardiac Enzymes: No results for input(s): CKTOTAL, CKMB, CKMBINDEX, TROPONINI in the last 168 hours. BNP: Invalid input(s): POCBNP CBG: No results for input(s): GLUCAP in the last 168 hours. D-Dimer No results for input(s): DDIMER in the last 72 hours. Hgb A1c No results for input(s): HGBA1C in the last 72 hours. Lipid Profile No results for input(s): CHOL, HDL, LDLCALC, TRIG, CHOLHDL, LDLDIRECT in the last 72 hours. Thyroid function studies No results for input(s): TSH, T4TOTAL, T3FREE, THYROIDAB in the last 72 hours.  Invalid input(s): FREET3 Anemia work up No results for input(s): VITAMINB12, FOLATE, FERRITIN, TIBC, IRON, RETICCTPCT in the last 72 hours. Urinalysis    Component Value Date/Time   COLORURINE YELLOW 09/15/2021 [place]  09/15/2021 0103   LABSPEC 1.015 09/15/2021 0103   PHURINE 7.5 09/15/2021 0103   GLUCOSEU NEGATIVE 09/15/2021 0103   HGBUR NEGATIVE 09/15/2021 0103   BILIRUBINUR NEGATIVE 09/15/2021 0103   KETONESUR NEGATIVE 09/15/2021 0103   PROTEINUR NEGATIVE 09/15/2021 0103   UROBILINOGEN 0.2 05/13/2014 0658   NITRITE NEGATIVE 09/15/2021 0103   LEUKOCYTESUR NEGATIVE 09/15/2021 0103   Sepsis Labs Invalid input(s): PROCALCITONIN,  WBC,  LACTICIDVEN Microbiology Recent Results (from the past 240 hour(s))  Resp Panel by RT-PCR (Flu A&B, Covid) Nasopharyngeal Swab     Status: None   Collection Time: 09/15/21  1:16 AM   Specimen: Nasopharyngeal Swab; Nasopharyngeal(NP) swabs in vial transport medium  Result Value Ref Range Status   SARS Coronavirus 2 by RT PCR NEGATIVE NEGATIVE Final    Comment: (NOTE) SARS-CoV-2 target nucleic acids are NOT DETECTED.  The SARS-CoV-2 RNA is generally detectable in upper respiratory specimens during the acute phase of infection. The lowest concentration of SARS-CoV-2 viral copies this assay can detect is 138 copies/mL. A negative result does not preclude SARS-Cov-2 infection and should not be used as the sole basis for treatment or other patient management decisions. A negative result may occur with  improper specimen collection/handling, submission of specimen other than nasopharyngeal swab, presence of viral mutation(s) within the areas targeted by this assay, and inadequate number of viral copies(<138 copies/mL). A negative result must be combined with clinical observations, patient history, and epidemiological information. The expected result is Negative.  Fact Sheet for Patients:  EntrepreneurPulse.com.au  Fact Sheet for Healthcare Providers:  IncredibleEmployment.be  This test is no t yet approved or cleared by the [place] FDA and  has been authorized for detection and/or diagnosis of SARS-CoV-2  by FDA under an Emergency Use Authorization (EUA). This EUA will remain  in effect (meaning this test can be used) for the duration of the COVID-19 declaration under Section 564(b)(1) of the Act, 21 U.S.C.section 360bbb-3(b)(1), unless the authorization is terminated  or revoked sooner.       Influenza A by PCR NEGATIVE NEGATIVE Final   Influenza B by PCR NEGATIVE NEGATIVE Final    Comment: (NOTE) The Xpert Xpress SARS-CoV-2/FLU/RSV plus assay is intended as an aid in the diagnosis of influenza from Nasopharyngeal swab specimens and should not be used as a sole basis for treatment. Nasal washings and aspirates are unacceptable for Xpert Xpress SARS-CoV-2/FLU/RSV testing.  Fact Sheet for Patients: EntrepreneurPulse.com.au  Fact Sheet for Healthcare Providers: IncredibleEmployment.be  This test is not yet approved or cleared by the [place] FDA and has been authorized for detection and/or diagnosis of SARS-CoV-2 by FDA under an Emergency Use Authorization (EUA). This EUA will remain in effect (meaning this test can be used) for the duration of the COVID-19 declaration under Section 564(b)(1) of the Act, 21 U.S.C. section 360bbb-3(b)(1), unless the authorization is terminated or revoked.  Performed at [facility], [address] [place], [place] [address]     Procedures/Studies: DG Chest 2 View  Result Date: 09/14/2021 CLINICAL DATA:  Right lower abdominal pain with cough and nausea. EXAM: CHEST - 2 VIEW COMPARISON:  August 29, 2021 FINDINGS: There is stable right-sided venous Port-A-Cath positioning. Multiple sternal wires and vascular clips are seen. Mild atelectasis is noted within the right lung base. A small right pleural effusion is present. No pneumothorax is identified. The heart size and mediastinal contours are within normal limits. A radiopaque fusion plate and screws are seen overlying the lower cervical spine.  The visualized skeletal structures are otherwise unremarkable. IMPRESSION: 1. Stable postoperative changes with mild right basilar atelectasis. 2. Small right pleural effusion. Electronically Signed   By: Virgina Norfolk M.D.   On: 09/14/2021 23:17   DG Ribs Unilateral W/Chest Right  Result Date: 09/16/2021 CLINICAL DATA:  Right posterior rib fracture, right flank pain, history of metastatic disease EXAM: RIGHT RIBS AND CHEST - 3+ VIEW COMPARISON:  09/15/2021, 09/14/2021 FINDINGS: Frontal view of the chest as well as frontal and oblique views of the right thoracic cage are obtained. Cardiac silhouette is stable. Postsurgical changes from median sternotomy. Right chest wall port unchanged. Small right pleural effusion.  No airspace disease or pneumothorax. Lytic lesion posterior right eleventh rib with associated pathologic fracture identified, better visualized on recent CT. The other smaller lytic lesions throughout the remainder of the thoracic cage are not as well visualized by radiograph. IMPRESSION: 1. Lytic lesion and pathologic fracture posterior right eleventh rib. 2. Small right pleural effusion. 3. Please refer to recent CT report describing numerous other small lytic lesions within the right thoracic cage. Electronically Signed   By: Randa Ngo M.D.   On: 09/16/2021 22:02   CT Angio Chest PE W/Cm &/Or Wo Cm  Result Date: 09/15/2021 CLINICAL DATA:  Dyspnea with right-sided abdominal pain and flank pain. EXAM: CT ANGIOGRAPHY CHEST WITH CONTRAST TECHNIQUE: Multidetector CT imaging of the chest was performed using the standard protocol during bolus administration of intravenous contrast. Multiplanar CT image reconstructions and MIPs were obtained to evaluate the vascular anatomy. RADIATION DOSE REDUCTION: This exam was performed according to the departmental dose-optimization program which includes automated exposure control, adjustment of the mA and/or kV according to patient size and/or use of  iterative reconstruction technique. CONTRAST:  145mL OMNIPAQUE IOHEXOL 350 MG/ML SOLN COMPARISON:  August 18, 2021 FINDINGS: Cardiovascular: A right-sided venous Port-A-Cath is in place. There is mild to moderate severity calcification of the aortic arch. Satisfactory opacification of the pulmonary arteries to the segmental level. No evidence of pulmonary embolism. Normal heart size. No pericardial effusion. Mediastinum/Nodes: No enlarged mediastinal, hilar, or axillary lymph nodes. Thyroid gland, trachea, and esophagus demonstrate no significant findings. Lungs/Pleura: A 12 mm x 11 mm noncalcified lung nodule is seen within the posteromedial aspect of the left upper lobe (axial CT image 29, CT series 4). This measures approximately 11 mm x 9 mm on the prior study. 6 mm and 4 mm noncalcified lung nodules are seen within the posterior aspect of the right upper lobe (axial CT images 37 and 46, CT series 2). These are new findings when compared to the prior study. New 3 mm and 6 mm noncalcified lung nodules are seen within the posterolateral aspects of the right lower lobe (axial CT images 52, 59 and 66, CT series 4). Mild atelectatic changes are seen within the posterior aspect of the right lung base. A very small right pleural effusion is seen. No pneumothorax is identified. Upper Abdomen: A stable simple cyst is seen within the left kidney. Musculoskeletal: Multiple sternal wires are present. Lytic lesions are again seen involving multiple right ribs. A pathologic fracture of the posterior aspect of the eleventh right rib is again seen. There has been interval enlargement of the soft tissue lesions seen within the posterior right chest wall with pleural invasion along the posteromedial aspect of the right lung base. A metallic density fixation plate and screws are seen along the anterior aspect of the lower cervical spine. The level degenerative changes are seen throughout  the thoracic spine. Review of the MIP  images confirms the above findings. IMPRESSION: 1. No evidence for pulmonary embolism. 2. Very mild interval increase in size of the previously noted left upper lobe lung nodule, with multiple new bilateral noncalcified lung nodules, consistent with pulmonary metastasis. 3. Very small right pleural effusion. 4. Interval enlargement of the soft tissue mass is seen within the posterior right chest wall with pleural invasion along right lung base. 5. Multiple right-sided lytic lesions with a pathologic fracture of the posterior eleventh right rib. Aortic Atherosclerosis (ICD10-I70.0). Electronically Signed   By: Virgina Norfolk M.D.   On: 09/15/2021 00:49   CT Abdomen Pelvis W Contrast  Result Date: 09/15/2021 CLINICAL DATA:  Right-sided abdominal pain and flank pain. EXAM: CT ABDOMEN AND PELVIS WITH CONTRAST TECHNIQUE: Multidetector CT imaging of the abdomen and pelvis was performed using the standard protocol following bolus administration of intravenous contrast. RADIATION DOSE REDUCTION: This exam was performed according to the departmental dose-optimization program which includes automated exposure control, adjustment of the mA and/or kV according to patient size and/or use of iterative reconstruction technique. CONTRAST:  193mL OMNIPAQUE IOHEXOL 350 MG/ML SOLN COMPARISON:  May 05, 2021 FINDINGS: Lower chest: Multiple subcentimeter noncalcified lung nodules are seen within the posterolateral aspect of the left lung base. Mild posterior right basilar scarring and/or atelectasis is noted. A small right pleural effusion is seen. Hepatobiliary: Punctate foci of parenchymal low attenuation are seen within the anterior aspect of the liver dome and inferior aspect of the right lobe of the liver (axial CT images 10 and 32, CT series 5). These are too small to characterize by CT examination. No gallstones, gallbladder wall thickening, or biliary dilatation. Pancreas: Unremarkable. No pancreatic ductal dilatation  or surrounding inflammatory changes. Spleen: Normal in size without focal abnormality. Adrenals/Urinary Tract: Adrenal glands are unremarkable. Kidneys are normal in size, without renal calculi or hydronephrosis. A stable simple cyst is seen within the anterior aspect of the mid left kidney. Bladder is unremarkable. Stomach/Bowel: Stomach is within normal limits. Appendix appears normal. No evidence of bowel wall thickening, distention, or inflammatory changes. Noninflamed diverticula are seen within the sigmoid colon. Vascular/Lymphatic: Aortic atherosclerosis. No enlarged abdominal or pelvic lymph nodes. Reproductive: The prostate gland is mildly enlarged. Other: No abdominal wall hernia or abnormality. No abdominopelvic ascites. Musculoskeletal: Multiple soft tissue masses are seen within the musculature and subcutaneous fat of the lateral and posterior aspects of the lower right chest wall. Pleural invasion is noted within the posterior aspect of the right lung base. These soft tissue masses extend along the posterior wall of the mid and upper right abdomen. This represents a new finding. Lytic lesions are seen involving multiple right ribs with a pathologic fracture noted involving the posterior eleventh right rib. Degenerative changes are seen within the lumbar spine. IMPRESSION: 1. Multiple neoplastic soft tissue masses within the lateral and posterior aspects of the lower right chest wall with associated pleural invasion. 2. Lytic lesions involving multiple right ribs with a pathologic fracture of the posterior eleventh right rib. 3. Multiple subcentimeter noncalcified lung nodules within the left lung base, consistent with pulmonary metastatic disease. 4. Sigmoid diverticulosis. 5. Small right pleural effusion. 6. Aortic atherosclerosis. Aortic Atherosclerosis (ICD10-I70.0). Electronically Signed   By: Virgina Norfolk M.D.   On: 09/15/2021 00:57   DG Chest Port 1 View  Result Date:  08/29/2021 CLINICAL DATA:  Chest pain. EXAM: PORTABLE CHEST 1 VIEW COMPARISON:  Chest x-ray 08/18/2021. FINDINGS: Cardiomediastinal silhouette is within normal  limits. Patient is status post cardiac surgery. Right chest port catheter tip projects over the distal SVC. There is no focal lung infiltrate, pleural effusion or pneumothorax. Cervical spinal fusion plate is present. No acute fractures are seen. IMPRESSION: No acute cardiopulmonary process. Electronically Signed   By: Ronney Asters M.D.   On: 08/29/2021 22:06     Time coordinating discharge: Over 30 minutes    Dwyane Dee, MD  [facility] 09/20/2021, 3:02 PM

## 2021-09-21 ENCOUNTER — Ambulatory Visit: Payer: Medicare Other

## 2021-09-21 ENCOUNTER — Encounter: Payer: Self-pay | Admitting: Internal Medicine

## 2021-09-21 ENCOUNTER — Telehealth: Payer: Self-pay | Admitting: Internal Medicine

## 2021-09-21 ENCOUNTER — Encounter: Payer: Medicare Other | Admitting: Nurse Practitioner

## 2021-09-21 ENCOUNTER — Ambulatory Visit: Payer: Medicare Other | Admitting: Internal Medicine

## 2021-09-21 ENCOUNTER — Other Ambulatory Visit (HOSPITAL_COMMUNITY): Payer: Self-pay

## 2021-09-21 ENCOUNTER — Encounter: Payer: Self-pay | Admitting: Physician Assistant

## 2021-09-21 NOTE — Telephone Encounter (Signed)
Pt had sent a msg to cancel his appt with Dr. [name] today. I called pt this morning and spoke to him. I r/s his appt. Pt is aware of new appt date and time.

## 2021-09-22 ENCOUNTER — Other Ambulatory Visit: Payer: Self-pay

## 2021-09-22 ENCOUNTER — Telehealth: Payer: Self-pay

## 2021-09-22 ENCOUNTER — Ambulatory Visit
Admission: RE | Admit: 2021-09-22 | Discharge: 2021-09-22 | Disposition: A | Discharge status: Home / Self Care | Payer: Medicare Other | Source: Ambulatory Visit | Attending: Radiation Oncology | Admitting: Radiation Oncology

## 2021-09-22 ENCOUNTER — Ambulatory Visit: Payer: Medicare Other

## 2021-09-22 DIAGNOSIS — C3491 Malignant neoplasm of unspecified part of right bronchus or lung: Secondary | ICD-10-CM | POA: Diagnosis present

## 2021-09-22 DIAGNOSIS — R911 Solitary pulmonary nodule: Secondary | ICD-10-CM | POA: Diagnosis present

## 2021-09-22 NOTE — Telephone Encounter (Signed)
I called Mr. Daft to go over his pain medications with him since he was recently discharged from the hospital. His MS Contin was increased to 60mg , therefore we advised him to take two 30mg  tablets (from his old prescription) and that he would have enough before needing an earlier refill between 1/30 and 1/31.  He was restarted on Dilaudid, which he was originally taking before his hospitalization. Therefore, he should have enough of this pain medication for a month. Understanding verbalized. All questions answered. Advised to call our office with any questions/concerns. I explained that I would follow up with Dr. Lew Dawes office at the end of this week to see what the plan is going forward with the [facility] consult and would schedule an appt with our office accordingly.

## 2021-09-22 NOTE — Progress Notes (Signed)
Pharmacist Chemotherapy Monitoring - Initial Assessment    Anticipated start date: 09/29/21   The following has been reviewed per standard work regarding the patient's treatment regimen: The patient's diagnosis, treatment plan and drug doses, and organ/hematologic function Lab orders and baseline tests specific to treatment regimen  The treatment plan start date, drug sequencing, and pre-medications Prior authorization status  Patient's documented medication list, including drug-drug interaction screen and prescriptions for anti-emetics and supportive care specific to the treatment regimen The drug concentrations, fluid compatibility, administration routes, and timing of the medications to be used The patient's access for treatment and lifetime cumulative dose history, if applicable  The patient's medication allergies and previous infusion related reactions, if applicable   Changes made to treatment plan:  N/A  Follow up needed:  N/A   Larene Beach, RPH, 09/22/2021  8:19 AM

## 2021-09-22 NOTE — Progress Notes (Signed)
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 During treatment he was still having pain in the right ribs and some anticipated skin changes in the treatment field.   Plan: The patient will receive a call in about one month from the radiation oncology department. He will continue follow up with Dr. [name] as well as by [name] in the brain oncology conference.   ________________________________________________    Carola Rhine, PAC

## 2021-09-23 ENCOUNTER — Ambulatory Visit: Payer: Medicare Other

## 2021-09-23 ENCOUNTER — Telehealth: Payer: Self-pay

## 2021-09-23 ENCOUNTER — Other Ambulatory Visit (HOSPITAL_COMMUNITY): Payer: Self-pay

## 2021-09-23 ENCOUNTER — Encounter: Payer: Self-pay | Admitting: Radiation Oncology

## 2021-09-23 ENCOUNTER — Ambulatory Visit
Admission: RE | Admit: 2021-09-23 | Discharge: 2021-09-23 | Disposition: A | Discharge status: Home / Self Care | Payer: Medicare Other | Source: Ambulatory Visit | Attending: Radiation Oncology | Admitting: Radiation Oncology

## 2021-09-23 DIAGNOSIS — R911 Solitary pulmonary nodule: Secondary | ICD-10-CM | POA: Diagnosis not present

## 2021-09-23 NOTE — Telephone Encounter (Signed)
Spoke with patient regarding Palliative Care referral received from [facility]. Patient states he was not aware of the referral. He will contact his oncologist for reasoning why the referral was sent and call back.

## 2021-09-23 NOTE — Telephone Encounter (Signed)
[name] called our office to let us know that he had received a phone call from [facility] for their palliative services but was unaware of this referral. Per [name], NP, she did not place this referral and I explained to him that there was miscommunication and that he didn't have to accept their services. He stated that he would continue to see our office for now and wait to see them if needed in the future. I also noticed that he didn't have an appt scheduled with our office, therefore I told him we could schedule him on 2/2 at 12pm after his appt with [facility], which he was in agreeance with. Understanding verbalized. All questions answered. Advised to call our office with any questions/concerns.

## 2021-09-24 ENCOUNTER — Ambulatory Visit: Payer: Medicare Other

## 2021-09-24 ENCOUNTER — Other Ambulatory Visit: Payer: Self-pay | Admitting: Surgical

## 2021-09-26 ENCOUNTER — Other Ambulatory Visit (HOSPITAL_COMMUNITY): Payer: Self-pay

## 2021-09-28 MED FILL — Dexamethasone Sodium Phosphate Inj 100 MG/10ML: INTRAMUSCULAR | Qty: 1 | Status: AC

## 2021-09-28 MED FILL — Fosaprepitant Dimeglumine For IV Infusion 150 MG (Base Eq): INTRAVENOUS | Qty: 5 | Status: AC

## 2021-09-29 ENCOUNTER — Inpatient Hospital Stay (HOSPITAL_BASED_OUTPATIENT_CLINIC_OR_DEPARTMENT_OTHER): Payer: Medicare Other | Admitting: Internal Medicine

## 2021-09-29 ENCOUNTER — Encounter: Payer: Self-pay | Admitting: Nurse Practitioner

## 2021-09-29 ENCOUNTER — Other Ambulatory Visit: Payer: Self-pay

## 2021-09-29 ENCOUNTER — Inpatient Hospital Stay: Payer: Medicare Other

## 2021-09-29 ENCOUNTER — Inpatient Hospital Stay (HOSPITAL_BASED_OUTPATIENT_CLINIC_OR_DEPARTMENT_OTHER): Payer: Medicare Other | Admitting: Nurse Practitioner

## 2021-09-29 ENCOUNTER — Other Ambulatory Visit: Payer: Self-pay | Admitting: Internal Medicine

## 2021-09-29 VITALS — BP 120/71 | HR 104 | Temp 97.4°F | Resp 19 | Ht 69.0 in | Wt 169.3 lb

## 2021-09-29 VITALS — HR 88

## 2021-09-29 DIAGNOSIS — C3491 Malignant neoplasm of unspecified part of right bronchus or lung: Secondary | ICD-10-CM | POA: Diagnosis not present

## 2021-09-29 DIAGNOSIS — G893 Neoplasm related pain (acute) (chronic): Secondary | ICD-10-CM

## 2021-09-29 DIAGNOSIS — Z79899 Other long term (current) drug therapy: Secondary | ICD-10-CM | POA: Insufficient documentation

## 2021-09-29 DIAGNOSIS — Z7189 Other specified counseling: Secondary | ICD-10-CM

## 2021-09-29 DIAGNOSIS — K5900 Constipation, unspecified: Secondary | ICD-10-CM

## 2021-09-29 DIAGNOSIS — C7B8 Other secondary neuroendocrine tumors: Secondary | ICD-10-CM | POA: Insufficient documentation

## 2021-09-29 DIAGNOSIS — Z95828 Presence of other vascular implants and grafts: Secondary | ICD-10-CM

## 2021-09-29 DIAGNOSIS — Z5111 Encounter for antineoplastic chemotherapy: Secondary | ICD-10-CM | POA: Insufficient documentation

## 2021-09-29 DIAGNOSIS — C7A8 Other malignant neuroendocrine tumors: Secondary | ICD-10-CM | POA: Insufficient documentation

## 2021-09-29 DIAGNOSIS — C7931 Secondary malignant neoplasm of brain: Secondary | ICD-10-CM | POA: Diagnosis not present

## 2021-09-29 DIAGNOSIS — C7A1 Malignant poorly differentiated neuroendocrine tumors: Secondary | ICD-10-CM

## 2021-09-29 DIAGNOSIS — Z515 Encounter for palliative care: Secondary | ICD-10-CM | POA: Diagnosis not present

## 2021-09-29 DIAGNOSIS — Z7901 Long term (current) use of anticoagulants: Secondary | ICD-10-CM | POA: Diagnosis not present

## 2021-09-29 DIAGNOSIS — Z5112 Encounter for antineoplastic immunotherapy: Secondary | ICD-10-CM

## 2021-09-29 DIAGNOSIS — R5382 Chronic fatigue, unspecified: Secondary | ICD-10-CM

## 2021-09-29 LAB — CBC WITH DIFFERENTIAL (CANCER CENTER ONLY)
Abs Immature Granulocytes: 0.04 10*3/uL (ref 0.00–0.07)
Basophils Absolute: 0 10*3/uL (ref 0.0–0.1)
Basophils Relative: 1 %
Eosinophils Absolute: 0.2 10*3/uL (ref 0.0–0.5)
Eosinophils Relative: 2 %
HCT: 32.9 % — ABNORMAL LOW (ref 39.0–52.0)
Hemoglobin: 10.7 g/dL — ABNORMAL LOW (ref 13.0–17.0)
Immature Granulocytes: 1 %
Lymphocytes Relative: 9 %
Lymphs Abs: 0.6 10*3/uL — ABNORMAL LOW (ref 0.7–4.0)
MCH: 29.8 pg (ref 26.0–34.0)
MCHC: 32.5 g/dL (ref 30.0–36.0)
MCV: 91.6 fL (ref 80.0–100.0)
Monocytes Absolute: 0.8 10*3/uL (ref 0.1–1.0)
Monocytes Relative: 12 %
Neutro Abs: 5.4 10*3/uL (ref 1.7–7.7)
Neutrophils Relative %: 75 %
Platelet Count: 174 10*3/uL (ref 150–400)
RBC: 3.59 MIL/uL — ABNORMAL LOW (ref 4.22–5.81)
RDW: 14.2 % (ref 11.5–15.5)
WBC Count: 7.1 10*3/uL (ref 4.0–10.5)
nRBC: 0 % (ref 0.0–0.2)

## 2021-09-29 LAB — CMP (CANCER CENTER ONLY)
ALT: 9 U/L (ref 0–44)
AST: 14 U/L — ABNORMAL LOW (ref 15–41)
Albumin: 3.8 g/dL (ref 3.5–5.0)
Alkaline Phosphatase: 74 U/L (ref 38–126)
Anion gap: 6 (ref 5–15)
BUN: 15 mg/dL (ref 8–23)
CO2: 28 mmol/L (ref 22–32)
Calcium: 10.3 mg/dL (ref 8.9–10.3)
Chloride: 103 mmol/L (ref 98–111)
Creatinine: 0.99 mg/dL (ref 0.61–1.24)
GFR, Estimated: >60 mL/min (ref >60)
Glucose, Bld: 90 mg/dL (ref 70–99)
Potassium: 3.9 mmol/L (ref 3.5–5.1)
Sodium: 137 mmol/L (ref 135–145)
Total Bilirubin: 0.4 mg/dL (ref 0.3–1.2)
Total Protein: 7.2 g/dL (ref 6.5–8.1)

## 2021-09-29 LAB — TSH: TSH: 1.228 u[IU]/mL (ref 0.320–4.118)

## 2021-09-29 MED ORDER — OXYCODONE HCL 5 MG PO TABS
5.0000 mg | ORAL_TABLET | Freq: Once | ORAL | Status: AC
  Administered 2021-09-29: 5 mg via ORAL
  Filled 2021-09-29: qty 1

## 2021-09-29 MED ORDER — SODIUM CHLORIDE 0.9 % IV SOLN: Freq: Once | INTRAVENOUS | Status: AC

## 2021-09-29 MED ORDER — SODIUM CHLORIDE 0.9% FLUSH
10.0000 mL | Freq: Once | INTRAVENOUS | Status: AC
  Administered 2021-09-29: 10 mL

## 2021-09-29 MED ORDER — ACETAMINOPHEN 325 MG PO TABS
325.0000 mg | ORAL_TABLET | Freq: Once | ORAL | Status: AC
  Administered 2021-09-29: 325 mg via ORAL
  Filled 2021-09-29: qty 1

## 2021-09-29 MED ORDER — SODIUM CHLORIDE 0.9 % IV SOLN
200.0000 mg | Freq: Once | INTRAVENOUS | Status: AC
  Administered 2021-09-29: 200 mg via INTRAVENOUS
  Filled 2021-09-29: qty 200

## 2021-09-29 MED ORDER — SODIUM CHLORIDE 0.9 % IV SOLN
528.5000 mg | Freq: Once | INTRAVENOUS | Status: AC
  Administered 2021-09-29: 530 mg via INTRAVENOUS
  Filled 2021-09-29: qty 53

## 2021-09-29 MED ORDER — SODIUM CHLORIDE 0.9 % IV SOLN
150.0000 mg | Freq: Once | INTRAVENOUS | Status: AC
  Administered 2021-09-29: 150 mg via INTRAVENOUS
  Filled 2021-09-29: qty 150

## 2021-09-29 MED ORDER — SODIUM CHLORIDE 0.9 % IV SOLN
500.0000 mg/m2 | Freq: Once | INTRAVENOUS | Status: AC
  Administered 2021-09-29: 1000 mg via INTRAVENOUS
  Filled 2021-09-29: qty 40

## 2021-09-29 MED ORDER — LIDOCAINE-PRILOCAINE 2.5-2.5 % EX CREA: TOPICAL_CREAM | CUTANEOUS | 0 refills | Status: DC

## 2021-09-29 MED ORDER — SODIUM CHLORIDE 0.9 % IV SOLN
10.0000 mg | Freq: Once | INTRAVENOUS | Status: AC
  Administered 2021-09-29: 10 mg via INTRAVENOUS
  Filled 2021-09-29: qty 10

## 2021-09-29 MED ORDER — SODIUM CHLORIDE 0.9% FLUSH
10.0000 mL | INTRAVENOUS | Status: DC | PRN
  Administered 2021-09-29: 10 mL

## 2021-09-29 MED ORDER — HEPARIN SOD (PORK) LOCK FLUSH 100 UNIT/ML IV SOLN
500.0000 [IU] | Freq: Once | INTRAVENOUS | Status: AC | PRN
  Administered 2021-09-29: 500 [IU]

## 2021-09-29 MED ORDER — PROCHLORPERAZINE MALEATE 10 MG PO TABS: 10.0000 mg | ORAL_TABLET | Freq: Four times a day (QID) | ORAL | 0 refills | Status: DC | PRN

## 2021-09-29 MED ORDER — PALONOSETRON HCL INJECTION 0.25 MG/5ML
0.2500 mg | Freq: Once | INTRAVENOUS | Status: AC
  Administered 2021-09-29: 0.25 mg via INTRAVENOUS
  Filled 2021-09-29: qty 5

## 2021-09-29 NOTE — Progress Notes (Signed)
[place] Telephone:([phone]) [phone]   Fax:([phone]) [phone]  OFFICE PROGRESS NOTE  [name], MD [name] [address]  DIAGNOSIS:  Stage IV (pT1c, N1, M1b) large cell neuroendocrine carcinoma.  He presented with a right upper lobe lung nodule and hilar lymphadenopathy.  He was diagnosed in April 2022. He also had a small metastatic brain lesion.  The patient has local recurrence of his disease and the right chest wall diagnosed in November 2022. Molecular studies by foundation 1 showed no actionable mutations  PDL1: 0%   PRIOR THERAPY:  1) Robotic assisted thoracic surgery with a right upper lobectomy which was performed on 12/24/2020.  2) SRS to a small metastatic brain lesion completed on February 13, 2021 under the care of Dr. Lisbeth Renshaw.   3) Adjuvant chemotherapy with cisplatin 80 mg/m2 on day 1, etoposide 100 mg per metered squared on days 1, 2, and 3, IV every 3 weeks. First dose on 02/09/21.  Status post 4 cycles. 4) SRS to 4 new brain metastasis under the care of Dr. Lisbeth Renshaw. 5) Palliative radiotherapy to the local disease recurrence on the right chest wall under the care of Dr. Lisbeth Renshaw.    CURRENT THERAPY: Palliative systemic chemotherapy with carboplatin for AUC of 5, Alimta 500 Mg/M2 and Keytruda 200 Mg IV every 3 weeks.  First dose September 29, 2021.  INTERVAL HISTORY: [name] 66 y.o. male returns to the clinic today for follow-up visit.  The patient is feeling fine today with no concerning complaints except for the persistent pain on the right side of the chest which has significant improved after his palliative radiotherapy.  He still on good amount of pain medication with MS Contin and MS IR followed by the palliative care team. The patient was seen recently by Dr. Durenda Hurt at [facility] for a second opinion and he recommended for him treatment with single agent nivolumab despite the negative PD-L1 expression but the  patient has high TMB.  He was concerned about the patient's ability to tolerate treatment with platinum based therapy again and some concern about lack of efficacy of the platinum based since his disease progression.  The patient denied having any current shortness of breath, cough or hemoptysis.  He denied having any weight loss or night sweats.  He has no nausea, vomiting, diarrhea or constipation.  He has no headache or visual changes.  He is here today to start the first cycle of his treatment.   MEDICAL HISTORY: Past Medical History:  Diagnosis Date   Anemia    Anxiety    Arthritis    Basal cell carcinoma (BCC) of forehead    CAD (coronary artery disease)    a. 10/2015 ant STEMI >> LHC with 3 v CAD; oLAD tx with POBA >> emergent CABG. b. Multiple evals since that time, early graft failure of SVG-RCA by cath 03/2016. c. 2/19 PCI/DES x1 to pRCA, normal EF.   Carotid artery disease ([place])    a. 40-59% BICA 02/2018.   Depression    Dyspnea    Ectopic atrial tachycardia (HCC)    Esophageal reflux    eosinophil esophagitis   Family history of adverse reaction to anesthesia    "sister has PONV" (06/21/2017)   Former tobacco use    Gout    Hepatitis C    "treated and cured" (06/21/2017)   High cholesterol    History of blood transfusion  History of kidney stones    Hypertension    Ischemic cardiomyopathy    a. EF 25-30% at intraop TEE 4/17  //  b. Limited Echo 5/17 - EF 45-50%, mild ant HK. c. EF 55-65% by cath 09/2017.   Migraine    "3-4/yr" (06/21/2017)   Myocardial infarction ([place]) 10/2015   Palpitations    Pneumonia    Sinus bradycardia    a. HR dropping into 40s in 02/2016 -> BB reduced.   Stroke [facility]) 10/2016   "small one; sometimes my memory/cognitive issues" (06/21/2017)   Symptomatic hypotension    a. 02/2016 ER visit -> meds reduced.   Syncope    Wears dentures    Wears glasses     ALLERGIES:  is allergic to indomethacin, prednisone, tetanus toxoids, wellbutrin  [bupropion], and varenicline.  MEDICATIONS:  Current Outpatient Medications  Medication Sig Dispense Refill   acetaminophen (TYLENOL) 500 MG tablet Take 1,000 mg by mouth every 6 (six) hours as needed for mild pain, fever or headache.     aspirin EC 81 MG tablet Take 81 mg by mouth daily. Swallow whole.     docusate sodium (COLACE) 100 MG capsule Take 100-200 mg by mouth daily.     DULoxetine 40 MG CPEP Take 40 mg by mouth every evening. 30 capsule 1   folic acid (FOLVITE) 1 MG tablet Take 1 tablet (1 mg total) by mouth daily. 30 tablet 4   gabapentin (NEURONTIN) 100 MG capsule Take 1 capsule by mouth twice a day and 3 capsules at bedtime. (Patient taking differently: 200-300 mg See admin instructions. Take 2 capsule by mouth in the morning, then take  3 capsules by mouth at bedtime.) 150 capsule 0   HYDROmorphone (DILAUDID) 4 MG tablet Take 1 tablet (4 mg total) by mouth every 3 (three) hours as needed for up to 7 days for severe pain. 56 tablet 0   lidocaine (LIDODERM) 5 % Place 1 patch onto the skin daily. Remove & Discard patch within 12 hours or as directed by MD (Patient taking differently: Place 1 patch onto the skin daily as needed (pain). Remove & Discard patch within 12 hours or as directed by MD) 30 patch 0   metoprolol succinate (TOPROL-XL) 25 MG 24 hr tablet Take 1 tablet (25 mg total) by mouth daily. (Patient taking differently: Take 25 mg by mouth every evening.) 90 tablet 3   morphine (MS CONTIN) 60 MG 12 hr tablet Take 1 tablet (60 mg total) by mouth every 8 (eight) hours for 14 days. 42 tablet 0   Multiple Vitamin (MULTIVITAMIN WITH MINERALS) TABS tablet Take 1 tablet by mouth daily.     ondansetron (ZOFRAN) 8 MG tablet Take 1 tablet (8 mg total) by mouth every 8 (eight) hours as needed for nausea or vomiting. Starting 3 days after chemotherapy 90 tablet 2   polyethylene glycol (MIRALAX / GLYCOLAX) 17 g packet Take 17 g by mouth daily. 14 each 0   ranolazine (RANEXA) 1000 MG SR  tablet Take 1 tablet (1,000 mg total) by mouth 2 (two) times daily. 180 tablet 3   tiZANidine (ZANAFLEX) 2 MG tablet Take 1 tablet (2 mg total) by mouth at bedtime as needed for muscle spasms. (Patient taking differently: Take 2 mg by mouth every evening.) 30 tablet 0   XARELTO 20 MG TABS tablet Take 20 mg by mouth every evening.     No current facility-administered medications for this visit.    SURGICAL HISTORY:  Past Surgical  History:  Procedure Laterality Date   25 GAUGE PARS PLANA VITRECTOMY WITH 20 GAUGE MVR PORT  12/31/2020   Procedure: 25 GAUGE PARS PLANA VITRECTOMY WITH 20 GAUGE MVR PORT;  Surgeon: [name], MD;  [facility];  Service: Thoracic;;   ANTERIOR CERVICAL DECOMP/DISCECTOMY FUSION N/A 10/17/2018   Procedure: Anterior Cervical Decompression Fusion - Cervical seven -Thoracic one;  Surgeon: Consuella Lose, MD;  Location: [facility];  Service: Neurosurgery;  Laterality: N/A;   BASAL CELL CARCINOMA EXCISION     "forehead   BIOPSY  07/20/2019   Procedure: BIOPSY;  Surgeon: Carol Ada, MD;  Location: [facility];  Service: Endoscopy;;   BIOPSY OF MEDIASTINAL MASS Right 07/29/2021   Procedure: RIGHT CHEST WALL MASS BIOPSY;  Surgeon: [name], MD;  Location: [place];  Service: Thoracic;  Laterality: Right;   BRONCHIAL BIOPSY  12/24/2020   Procedure: BRONCHIAL BIOPSIES;  Surgeon: Garner Nash, DO;  Location: [place] [facility];  Service: Pulmonary;;   BRONCHIAL BRUSHINGS  12/24/2020   Procedure: BRONCHIAL BRUSHINGS;  Surgeon: Garner Nash, DO;  Location: [facility];  Service: Pulmonary;;   BRONCHIAL NEEDLE ASPIRATION BIOPSY  12/24/2020   Procedure: BRONCHIAL NEEDLE ASPIRATION BIOPSIES;  Surgeon: Garner Nash, DO;  Location: [place];  Service: Pulmonary;;   CARDIAC CATHETERIZATION N/A 11/28/2015   Procedure: Left Heart Cath and Coronary Angiography;  Surgeon: [name], MD;  Location: [place] [facility];  Service: Cardiovascular;   Laterality: N/A;   CARDIAC CATHETERIZATION N/A 11/28/2015   Procedure: Coronary Balloon Angioplasty;  Surgeon: [name], MD;  Location: [place] [facility];  Service: Cardiovascular;  Laterality: N/A;  ostial LAD   CARDIAC CATHETERIZATION N/A 11/28/2015   Procedure: Coronary/Graft Angiography;  Surgeon: [name], MD;  Location: [place] [facility];  Service: Cardiovascular;  Laterality: N/A;  coronaries only    CARDIAC CATHETERIZATION N/A 04/21/2016   Procedure: Left Heart Cath and Coronary Angiography;  Surgeon: Wellington Hampshire, MD;  Location: [place] [facility];  Service: Cardiovascular;  Laterality: N/A;   CARDIAC CATHETERIZATION N/A 06/14/2016   Procedure: Left Heart Cath and Cors/Grafts Angiography;  Surgeon: Lorretta Harp, MD;  Location: [place] [facility];  Service: Cardiovascular;  Laterality: N/A;   CARDIAC CATHETERIZATION N/A 09/08/2016   Procedure: Left Heart Cath and Cors/Grafts Angiography;  Surgeon: Wellington Hampshire, MD;  Location: [place] [facility];  Service: Cardiovascular;  Laterality: N/A;   CARDIAC CATHETERIZATION     CORONARY ARTERY BYPASS GRAFT N/A 11/28/2015   Procedure: CORONARY ARTERY BYPASS GRAFTING (CABG) TIMES FIVE USING LEFT INTERNAL MAMMARY ARTERY AND RIGHT GREATER SAPHENOUS,VIEN HARVEATED BY ENDOVIEN, INTRAOPPRATIVE TEE;  Surgeon: Gaye Pollack, MD;  Location: [place];  Service: Open Heart Surgery;  Laterality: N/A;   CORONARY STENT INTERVENTION N/A 10/05/2017   Procedure: CORONARY STENT INTERVENTION;  Surgeon: [name], MD;  Location: [place] [facility];  Service: Cardiovascular;  Laterality: N/A;   ESOPHAGOGASTRODUODENOSCOPY (EGD) WITH PROPOFOL N/A 07/20/2019   Procedure: ESOPHAGOGASTRODUODENOSCOPY (EGD) WITH PROPOFOL;  Surgeon: Carol Ada, MD;  [facility];  Service: Endoscopy;  Laterality: N/A;   ESOPHAGOGASTRODUODENOSCOPY (EGD) WITH PROPOFOL N/A 01/30/2021   Procedure: ESOPHAGOGASTRODUODENOSCOPY (EGD) WITH PROPOFOL;   Surgeon: Carol Ada, MD;  [facility];  Service: Endoscopy;  Laterality: N/A;   FIDUCIAL MARKER PLACEMENT  12/24/2020   Procedure: FIDUCIAL DYE MARKER PLACEMENT;  Surgeon: Garner Nash, DO;  Location: [place] [facility];  Service: Pulmonary;;   HUMERUS SURGERY Right 1969   "  tumor inside bone; filled it w/bone chips"   INTERCOSTAL NERVE BLOCK Right 12/24/2020   Procedure: INTERCOSTAL NERVE BLOCK;  Surgeon: [name], MD;  Location: [place];  Service: Thoracic;  Laterality: Right;   IR IMAGING GUIDED PORT INSERTION  02/06/2021   LEFT HEART CATH AND CORS/GRAFTS ANGIOGRAPHY N/A 03/11/2017   Procedure: Left Heart Cath and Cors/Grafts Angiography;  Surgeon: Leonie Man, MD;  Location: [place] [facility];  Service: Cardiovascular;  Laterality: N/A;   LEFT HEART CATH AND CORS/GRAFTS ANGIOGRAPHY N/A 10/05/2017   Procedure: LEFT HEART CATH AND CORS/GRAFTS ANGIOGRAPHY;  Surgeon: [name], MD;  Location: [place] [facility];  Service: Cardiovascular;  Laterality: N/A;   LEFT HEART CATH AND CORS/GRAFTS ANGIOGRAPHY N/A 04/11/2019   Procedure: LEFT HEART CATH AND CORS/GRAFTS ANGIOGRAPHY;  Surgeon: [name], MD;  Location: [place] [facility];  Service: Cardiovascular;  Laterality: N/A;   NODE DISSECTION Right 12/24/2020   Procedure: NODE DISSECTION;  Surgeon: [name], MD;  Location: [place];  Service: Thoracic;  Laterality: Right;   PERIPHERAL VASCULAR CATHETERIZATION N/A 06/14/2016   Procedure: Lower Extremity Angiography;  Surgeon: Lorretta Harp, MD;  Location: [place] [facility];  Service: Cardiovascular;  Laterality: N/A;   VIDEO BRONCHOSCOPY WITH ENDOBRONCHIAL NAVIGATION Right 12/24/2020   Procedure: VIDEO BRONCHOSCOPY WITH ENDOBRONCHIAL NAVIGATION;  Surgeon: Garner Nash, DO;  Location: [place];  Service: Pulmonary;  Laterality: Right;   VIDEO BRONCHOSCOPY WITH ENDOBRONCHIAL ULTRASOUND N/A 12/24/2020   Procedure: VIDEO BRONCHOSCOPY WITH  ENDOBRONCHIAL ULTRASOUND;  Surgeon: Garner Nash, DO;  Location: [place];  Service: Pulmonary;  Laterality: N/A;   VIDEO BRONCHOSCOPY WITH INSERTION OF INTERBRONCHIAL VALVE (IBV) N/A 12/31/2020   Procedure: VIDEO BRONCHOSCOPY WITH INSERTION OF INTERBRONCHIAL VALVE (IBV).VALVE IN CARTRIDGE 61mm,9mm. CHEST TUBE PLACEMENT.;  Surgeon: [name], MD;  [facility];  Service: Thoracic;  Laterality: N/A;   VIDEO BRONCHOSCOPY WITH INSERTION OF INTERBRONCHIAL VALVE (IBV) N/A 07/29/2021   Procedure: VIDEO BRONCHOSCOPY WITH REMOVAL OF INTERBRONCHIAL VALVE (IBV);  Surgeon: [name], MD;  Location: [facility] OR;  Service: Thoracic;  Laterality: N/A;    REVIEW OF SYSTEMS:  Constitutional: positive for fatigue Eyes: negative Ears, nose, mouth, throat, and face: negative Respiratory: positive for pleurisy/chest pain Cardiovascular: negative Gastrointestinal: negative Genitourinary:negative Integument/breast: negative Hematologic/lymphatic: negative Musculoskeletal:negative Neurological: negative Behavioral/Psych: negative Endocrine: negative Allergic/Immunologic: negative   PHYSICAL EXAMINATION: General appearance: alert, cooperative, fatigued, and no distress Head: Normocephalic, without obvious abnormality, atraumatic Neck: no adenopathy, no JVD, supple, symmetrical, trachea midline, and thyroid not enlarged, symmetric, no tenderness/mass/nodules Lymph nodes: Cervical, supraclavicular, and axillary nodes normal. Resp: clear to auscultation bilaterally Back: negative Cardio: Tachycardic GI: soft, non-tender; bowel sounds normal; no masses,  no organomegaly Extremities: extremities normal, atraumatic, no cyanosis or edema Neurologic: Alert and oriented X 3, normal strength and tone. Normal symmetric reflexes. Normal coordination and gait  ECOG PERFORMANCE STATUS: 1 - Symptomatic but completely ambulatory  Blood pressure 120/71, pulse (!) 104, temperature (!) 97.4 F (36.3  C), temperature source Tympanic, resp. rate 19, height $RemoveBe'5\' 9"'DmhHynOCF$  (1.753 m), weight 169 lb 4.8 oz (76.8 kg), SpO2 98 %.  LABORATORY DATA: Lab Results  Component Value Date   WBC 7.1 09/29/2021   HGB 10.7 (L) 09/29/2021   HCT 32.9 (L) 09/29/2021   MCV 91.6 09/29/2021   PLT 174 09/29/2021      Chemistry      Component Value Date/Time   NA 137 09/29/2021 1130   K 3.9  09/29/2021 1130   CL 103 09/29/2021 1130   CO2 28 09/29/2021 1130   BUN 15 09/29/2021 1130   CREATININE 0.99 09/29/2021 1130   CREATININE 1.00 02/12/2016 1038      Component Value Date/Time   CALCIUM 10.3 09/29/2021 1130   ALKPHOS 74 09/29/2021 1130   AST 14 (L) 09/29/2021 1130   ALT 9 09/29/2021 1130   BILITOT 0.4 09/29/2021 1130       RADIOGRAPHIC STUDIES: DG Chest 2 View  Result Date: 09/14/2021 CLINICAL DATA:  Right lower abdominal pain with cough and nausea. EXAM: CHEST - 2 VIEW COMPARISON:  August 29, 2021 FINDINGS: There is stable right-sided venous Port-A-Cath positioning. Multiple sternal wires and vascular clips are seen. Mild atelectasis is noted within the right lung base. A small right pleural effusion is present. No pneumothorax is identified. The heart size and mediastinal contours are within normal limits. A radiopaque fusion plate and screws are seen overlying the lower cervical spine. The visualized skeletal structures are otherwise unremarkable. IMPRESSION: 1. Stable postoperative changes with mild right basilar atelectasis. 2. Small right pleural effusion. Electronically Signed   By: Virgina Norfolk M.D.   On: 09/14/2021 23:17   DG Ribs Unilateral W/Chest Right  Result Date: 09/16/2021 CLINICAL DATA:  Right posterior rib fracture, right flank pain, history of metastatic disease EXAM: RIGHT RIBS AND CHEST - 3+ VIEW COMPARISON:  09/15/2021, 09/14/2021 FINDINGS: Frontal view of the chest as well as frontal and oblique views of the right thoracic cage are obtained. Cardiac silhouette is stable.  Postsurgical changes from median sternotomy. Right chest wall port unchanged. Small right pleural effusion.  No airspace disease or pneumothorax. Lytic lesion posterior right eleventh rib with associated pathologic fracture identified, better visualized on recent CT. The other smaller lytic lesions throughout the remainder of the thoracic cage are not as well visualized by radiograph. IMPRESSION: 1. Lytic lesion and pathologic fracture posterior right eleventh rib. 2. Small right pleural effusion. 3. Please refer to recent CT report describing numerous other small lytic lesions within the right thoracic cage. Electronically Signed   By: Randa Ngo M.D.   On: 09/16/2021 22:02   CT Angio Chest PE W/Cm &/Or Wo Cm  Result Date: 09/15/2021 CLINICAL DATA:  Dyspnea with right-sided abdominal pain and flank pain. EXAM: CT ANGIOGRAPHY CHEST WITH CONTRAST TECHNIQUE: Multidetector CT imaging of the chest was performed using the standard protocol during bolus administration of intravenous contrast. Multiplanar CT image reconstructions and MIPs were obtained to evaluate the vascular anatomy. RADIATION DOSE REDUCTION: This exam was performed according to the departmental dose-optimization program which includes automated exposure control, adjustment of the mA and/or kV according to patient size and/or use of iterative reconstruction technique. CONTRAST:  143m OMNIPAQUE IOHEXOL 350 MG/ML SOLN COMPARISON:  August 18, 2021 FINDINGS: Cardiovascular: A right-sided venous Port-A-Cath is in place. There is mild to moderate severity calcification of the aortic arch. Satisfactory opacification of the pulmonary arteries to the segmental level. No evidence of pulmonary embolism. Normal heart size. No pericardial effusion. Mediastinum/Nodes: No enlarged mediastinal, hilar, or axillary lymph nodes. Thyroid gland, trachea, and esophagus demonstrate no significant findings. Lungs/Pleura: A 12 mm x 11 mm noncalcified lung nodule is  seen within the posteromedial aspect of the left upper lobe (axial CT image 29, CT series 4). This measures approximately 11 mm x 9 mm on the prior study. 6 mm and 4 mm noncalcified lung nodules are seen within the posterior aspect of the right upper lobe (axial CT images  37 and 46, CT series 2). These are new findings when compared to the prior study. New 3 mm and 6 mm noncalcified lung nodules are seen within the posterolateral aspects of the right lower lobe (axial CT images 52, 59 and 66, CT series 4). Mild atelectatic changes are seen within the posterior aspect of the right lung base. A very small right pleural effusion is seen. No pneumothorax is identified. Upper Abdomen: A stable simple cyst is seen within the left kidney. Musculoskeletal: Multiple sternal wires are present. Lytic lesions are again seen involving multiple right ribs. A pathologic fracture of the posterior aspect of the eleventh right rib is again seen. There has been interval enlargement of the soft tissue lesions seen within the posterior right chest wall with pleural invasion along the posteromedial aspect of the right lung base. A metallic density fixation plate and screws are seen along the anterior aspect of the lower cervical spine. The level degenerative changes are seen throughout the thoracic spine. Review of the MIP images confirms the above findings. IMPRESSION: 1. No evidence for pulmonary embolism. 2. Very mild interval increase in size of the previously noted left upper lobe lung nodule, with multiple new bilateral noncalcified lung nodules, consistent with pulmonary metastasis. 3. Very small right pleural effusion. 4. Interval enlargement of the soft tissue mass is seen within the posterior right chest wall with pleural invasion along right lung base. 5. Multiple right-sided lytic lesions with a pathologic fracture of the posterior eleventh right rib. Aortic Atherosclerosis (ICD10-I70.0). Electronically Signed   By: Virgina Norfolk M.D.   On: 09/15/2021 00:49   CT Abdomen Pelvis W Contrast  Result Date: 09/15/2021 CLINICAL DATA:  Right-sided abdominal pain and flank pain. EXAM: CT ABDOMEN AND PELVIS WITH CONTRAST TECHNIQUE: Multidetector CT imaging of the abdomen and pelvis was performed using the standard protocol following bolus administration of intravenous contrast. RADIATION DOSE REDUCTION: This exam was performed according to the departmental dose-optimization program which includes automated exposure control, adjustment of the mA and/or kV according to patient size and/or use of iterative reconstruction technique. CONTRAST:  186mL OMNIPAQUE IOHEXOL 350 MG/ML SOLN COMPARISON:  May 05, 2021 FINDINGS: Lower chest: Multiple subcentimeter noncalcified lung nodules are seen within the posterolateral aspect of the left lung base. Mild posterior right basilar scarring and/or atelectasis is noted. A small right pleural effusion is seen. Hepatobiliary: Punctate foci of parenchymal low attenuation are seen within the anterior aspect of the liver dome and inferior aspect of the right lobe of the liver (axial CT images 10 and 32, CT series 5). These are too small to characterize by CT examination. No gallstones, gallbladder wall thickening, or biliary dilatation. Pancreas: Unremarkable. No pancreatic ductal dilatation or surrounding inflammatory changes. Spleen: Normal in size without focal abnormality. Adrenals/Urinary Tract: Adrenal glands are unremarkable. Kidneys are normal in size, without renal calculi or hydronephrosis. A stable simple cyst is seen within the anterior aspect of the mid left kidney. Bladder is unremarkable. Stomach/Bowel: Stomach is within normal limits. Appendix appears normal. No evidence of bowel wall thickening, distention, or inflammatory changes. Noninflamed diverticula are seen within the sigmoid colon. Vascular/Lymphatic: Aortic atherosclerosis. No enlarged abdominal or pelvic lymph nodes.  Reproductive: The prostate gland is mildly enlarged. Other: No abdominal wall hernia or abnormality. No abdominopelvic ascites. Musculoskeletal: Multiple soft tissue masses are seen within the musculature and subcutaneous fat of the lateral and posterior aspects of the lower right chest wall. Pleural invasion is noted within the posterior aspect of the  right lung base. These soft tissue masses extend along the posterior wall of the mid and upper right abdomen. This represents a new finding. Lytic lesions are seen involving multiple right ribs with a pathologic fracture noted involving the posterior eleventh right rib. Degenerative changes are seen within the lumbar spine. IMPRESSION: 1. Multiple neoplastic soft tissue masses within the lateral and posterior aspects of the lower right chest wall with associated pleural invasion. 2. Lytic lesions involving multiple right ribs with a pathologic fracture of the posterior eleventh right rib. 3. Multiple subcentimeter noncalcified lung nodules within the left lung base, consistent with pulmonary metastatic disease. 4. Sigmoid diverticulosis. 5. Small right pleural effusion. 6. Aortic atherosclerosis. Aortic Atherosclerosis (ICD10-I70.0). Electronically Signed   By: Virgina Norfolk M.D.   On: 09/15/2021 00:57    ASSESSMENT AND PLAN: This is a very pleasant 66 years old white male recently diagnosed with a stage IV (T1c, N1, M1 B) large cell neuroendocrine carcinoma presented with right upper lobe lung nodule in addition to right hilar adenopathy in April 2022 and the patient was found to have solitary brain metastasis in May 2022 He is status post right upper lobectomy with lymph node sampling under the care of Dr. Kipp Brood. The patient underwent systemic chemotherapy with cisplatin 80 mg/M2 on day 1 and etoposide 100 Mg/M2 on days 1, 2 and 3 every 3 weeks.  He is status post 4 cycles.  He has a rough time tolerating his adjuvant systemic chemotherapy and it was  discontinued after cycle #4. He is currently on observation but continues to have pain in the right side of the chest.  He was seen by Dr. Kipp Brood and underwent resection of the chest wall nodule and the final pathology was consistent with local recurrence of his disease and the right chest wall at the site of one of the chest wall tube. The patient was also found on recent MRI of the brain to have 4 new small brain metastasis that were treated with SRS. He is currently undergoing palliative radiotherapy to the local disease recurrence in the right side of the chest wall likely from implants after his surgical resection. His imaging studies also showed new bilateral pulmonary nodules more on the left lung. I had a lengthy discussion with the patient today about his condition and treatment options.  I discussed with the patient the treatment option with single agent nivolumab as recommended by Dr. Durenda Hurt versus proceeding with the combination of first-line systemic chemotherapy with carboplatin for AUC of 5, Alimta 500 Mg/M2 and Keytruda 200 Mg IV every 3 weeks.  I discussed with the patient the pros and cons of both regimens.  He would like to be more aggressive in the treatment of his cancer and he does not mind taking systemic chemotherapy in combination with immunotherapy.  I also explained to the patient that if he decided to go with this treatment and he has significant adverse effect from the carboplatin or the attempt this can be discontinued and he can continue maintenance treatment with either single agent Keytruda or a combination of Alimta and Keytruda depending on the toxicity. The patient is in agreement with the current plan and he understands the adverse effect of this regimen. He will proceed with the first dose today as planned. For the pain management, the patient will continue his current treatment with MS Contin, MS IR and gabapentin.  He is followed by the palliative care  clinic. I will see him back for follow-up visit  in 3 weeks for evaluation before starting cycle #2. He was advised to call immediately if he has any other concerning symptoms in the interval. The patient voices understanding of current disease status and treatment options and is in agreement with the current care plan. All questions were answered. The patient knows to call the clinic with any problems, questions or concerns. We can certainly see the patient much sooner if necessary. The total time spent in the appointment was 40 minutes.   Disclaimer: This note was dictated with voice recognition software. Similar sounding words can inadvertently be transcribed and may not be corrected upon review.

## 2021-09-29 NOTE — Patient Instructions (Signed)
[facility]  Discharge Instructions: Thank you for choosing [facility] to provide your oncology and hematology care.   If you have a lab appointment with the [facility], please go directly to the [facility] and check in at the registration area.   Wear comfortable clothing and clothing appropriate for easy access to any Portacath or PICC line.   We strive to give you quality time with your provider. You may need to reschedule your appointment if you arrive late (15 or more minutes).  Arriving late affects you and other patients whose appointments are after yours.  Also, if you miss three or more appointments without notifying the office, you may be dismissed from the clinic at the providers discretion.      For prescription refill requests, have your pharmacy contact our office and allow 72 hours for refills to be completed.    Today you received the following chemotherapy and/or immunotherapy agents: Pembrolizumab (Keytruda),  Pemetrexed (Alimta), and Carboplatin.   To help prevent nausea and vomiting after your treatment, we encourage you to take your nausea medication as directed.  BELOW ARE SYMPTOMS THAT SHOULD BE REPORTED IMMEDIATELY: *FEVER GREATER THAN 100.4 F (38 C) OR HIGHER *CHILLS OR SWEATING *NAUSEA AND VOMITING THAT IS NOT CONTROLLED WITH YOUR NAUSEA MEDICATION *UNUSUAL SHORTNESS OF BREATH *UNUSUAL BRUISING OR BLEEDING *URINARY PROBLEMS (pain or burning when urinating, or frequent urination) *BOWEL PROBLEMS (unusual diarrhea, constipation, pain near the anus) TENDERNESS IN MOUTH AND THROAT WITH OR WITHOUT PRESENCE OF ULCERS (sore throat, sores in mouth, or a toothache) UNUSUAL RASH, SWELLING OR PAIN  UNUSUAL VAGINAL DISCHARGE OR ITCHING   Items with * indicate a potential emergency and should be followed up as soon as possible or go to the Emergency Department if any problems should occur.  Please show the CHEMOTHERAPY  ALERT CARD or IMMUNOTHERAPY ALERT CARD at check-in to the Emergency Department and triage nurse.  Should you have questions after your visit or need to cancel or reschedule your appointment, please contact [facility]  Dept: [phone]  and follow the prompts.  Office hours are 8:00 a.m. to 4:30 p.m. Monday - Friday. Please note that voicemails left after 4:00 p.m. may not be returned until the following business day.  We are closed weekends and major holidays. You have access to a nurse at all times for urgent questions. Please call the main number to the clinic Dept: [phone] and follow the prompts.   For any non-urgent questions, you may also contact your provider using MyChart. We now offer e-Visits for anyone 68 and older to request care online for non-urgent symptoms. For details visit [email or web address].   Also download the MyChart app! Go to the app store, search "MyChart", open the app, select [facility], and log in with your MyChart username and password.  Due to Covid, a mask is required upon entering the hospital/clinic. If you do not have a mask, one will be given to you upon arrival. For doctor visits, patients may have 1 support person aged 35 or older with them. For treatment visits, patients cannot have anyone with them due to current Covid guidelines and our immunocompromised population.   Pembrolizumab injection What is this medication? PEMBROLIZUMAB (pem broe liz ue mab) is a monoclonal antibody. It is used to treat certain types of cancer. This medicine may be used for other purposes; ask your health care provider or pharmacist if you have questions. COMMON BRAND  NAME(S): Keytruda What should I tell my care team before I take this medication? They need to know if you have any of these conditions: autoimmune diseases like Crohn's disease, ulcerative colitis, or lupus have had or planning to have an allogeneic stem cell transplant (uses  someone else's stem cells) history of organ transplant history of chest radiation nervous system problems like myasthenia gravis or Guillain-Barre syndrome an unusual or allergic reaction to pembrolizumab, other medicines, foods, dyes, or preservatives pregnant or trying to get pregnant breast-feeding How should I use this medication? This medicine is for infusion into a vein. It is given by a health care professional in a hospital or clinic setting. A special MedGuide will be given to you before each treatment. Be sure to read this information carefully each time. Talk to your pediatrician regarding the use of this medicine in children. While this drug may be prescribed for children as young as 6 months for selected conditions, precautions do apply. Overdosage: If you think you have taken too much of this medicine contact a poison control center or emergency room at once. NOTE: This medicine is only for you. Do not share this medicine with others. What if I miss a dose? It is important not to miss your dose. Call your doctor or health care professional if you are unable to keep an appointment. What may interact with this medication? Interactions have not been studied. This list may not describe all possible interactions. Give your health care provider a list of all the medicines, herbs, non-prescription drugs, or dietary supplements you use. Also tell them if you smoke, drink alcohol, or use illegal drugs. Some items may interact with your medicine. What should I watch for while using this medication? Your condition will be monitored carefully while you are receiving this medicine. You may need blood work done while you are taking this medicine. Do not become pregnant while taking this medicine or for 4 months after stopping it. Women should inform their doctor if they wish to become pregnant or think they might be pregnant. There is a potential for serious side effects to an unborn child. Talk  to your health care professional or pharmacist for more information. Do not breast-feed an infant while taking this medicine or for 4 months after the last dose. What side effects may I notice from receiving this medication? Side effects that you should report to your doctor or health care professional as soon as possible: allergic reactions like skin rash, itching or hives, swelling of the face, lips, or tongue bloody or black, tarry breathing problems changes in vision chest pain chills confusion constipation cough diarrhea dizziness or feeling faint or lightheaded fast or irregular heartbeat fever flushing joint pain low blood counts - this medicine may decrease the number of white blood cells, red blood cells and platelets. You may be at increased risk for infections and bleeding. muscle pain muscle weakness pain, tingling, numbness in the hands or feet persistent headache redness, blistering, peeling or loosening of the skin, including inside the mouth signs and symptoms of high blood sugar such as dizziness; dry mouth; dry skin; fruity breath; nausea; stomach pain; increased hunger or thirst; increased urination signs and symptoms of kidney injury like trouble passing urine or change in the amount of urine signs and symptoms of liver injury like dark urine, light-colored stools, loss of appetite, nausea, right upper belly pain, yellowing of the eyes or skin sweating swollen lymph nodes weight loss Side effects that usually do  not require medical attention (report to your doctor or health care professional if they continue or are bothersome): decreased appetite hair loss tiredness This list may not describe all possible side effects. Call your doctor for medical advice about side effects. You may report side effects to FDA at 1-800-FDA-1088. Where should I keep my medication? This drug is given in a hospital or clinic and will not be stored at home. NOTE: This sheet is a  summary. It may not cover all possible information. If you have questions about this medicine, talk to your doctor, pharmacist, or health care provider.  2022 Elsevier/Gold Standard (2021-05-05 00:00:00)  Pemetrexed injection What is this medication? PEMETREXED (PEM e TREX ed) is a chemotherapy drug used to treat lung cancers like non-small cell lung cancer and mesothelioma. It may also be used to treat other cancers. This medicine may be used for other purposes; ask your health care provider or pharmacist if you have questions. COMMON BRAND NAME(S): Alimta, PEMFEXY What should I tell my care team before I take this medication? They need to know if you have any of these conditions: infection (especially a virus infection such as chickenpox, cold sores, or herpes) kidney disease low blood counts, like low white cell, platelet, or red cell counts lung or breathing disease, like asthma radiation therapy an unusual or allergic reaction to pemetrexed, other medicines, foods, dyes, or preservative pregnant or trying to get pregnant breast-feeding How should I use this medication? This drug is given as an infusion into a vein. It is administered in a hospital or clinic by a specially trained health care professional. Talk to your pediatrician regarding the use of this medicine in children. Special care may be needed. Overdosage: If you think you have taken too much of this medicine contact a poison control center or emergency room at once. NOTE: This medicine is only for you. Do not share this medicine with others. What if I miss a dose? It is important not to miss your dose. Call your doctor or health care professional if you are unable to keep an appointment. What may interact with this medication? This medicine may interact with the following medications: Ibuprofen This list may not describe all possible interactions. Give your health care provider a list of all the medicines, herbs,  non-prescription drugs, or dietary supplements you use. Also tell them if you smoke, drink alcohol, or use illegal drugs. Some items may interact with your medicine. What should I watch for while using this medication? Visit your doctor for checks on your progress. This drug may make you feel generally unwell. This is not uncommon, as chemotherapy can affect healthy cells as well as cancer cells. Report any side effects. Continue your course of treatment even though you feel ill unless your doctor tells you to stop. In some cases, you may be given additional medicines to help with side effects. Follow all directions for their use. Call your doctor or health care professional for advice if you get a fever, chills or sore throat, or other symptoms of a cold or flu. Do not treat yourself. This drug decreases your body's ability to fight infections. Try to avoid being around people who are sick. This medicine may increase your risk to bruise or bleed. Call your doctor or health care professional if you notice any unusual bleeding. Be careful brushing and flossing your teeth or using a toothpick because you may get an infection or bleed more easily. If you have any dental work  done, tell your dentist you are receiving this medicine. Avoid taking products that contain aspirin, acetaminophen, ibuprofen, naproxen, or ketoprofen unless instructed by your doctor. These medicines may hide a fever. Call your doctor or health care professional if you get diarrhea or mouth sores. Do not treat yourself. To protect your kidneys, drink water or other fluids as directed while you are taking this medicine. Do not become pregnant while taking this medicine or for 6 months after stopping it. Women should inform their doctor if they wish to become pregnant or think they might be pregnant. Men should not father a child while taking this medicine and for 3 months after stopping it. This may interfere with the ability to father a  child. You should talk to your doctor or health care professional if you are concerned about your fertility. There is a potential for serious side effects to an unborn child. Talk to your health care professional or pharmacist for more information. Do not breast-feed an infant while taking this medicine or for 1 week after stopping it. What side effects may I notice from receiving this medication? Side effects that you should report to your doctor or health care professional as soon as possible: allergic reactions like skin rash, itching or hives, swelling of the face, lips, or tongue breathing problems redness, blistering, peeling or loosening of the skin, including inside the mouth signs and symptoms of bleeding such as bloody or black, tarry stools; red or dark-brown urine; spitting up blood or brown material that looks like coffee grounds; red spots on the skin; unusual bruising or bleeding from the eye, gums, or nose signs and symptoms of infection like fever or chills; cough; sore throat; pain or trouble passing urine signs and symptoms of kidney injury like trouble passing urine or change in the amount of urine signs and symptoms of liver injury like dark yellow or brown urine; general ill feeling or flu-like symptoms; light-colored stools; loss of appetite; nausea; right upper belly pain; unusually weak or tired; yellowing of the eyes or skin Side effects that usually do not require medical attention (report to your doctor or health care professional if they continue or are bothersome): constipation mouth sores nausea, vomiting unusually weak or tired This list may not describe all possible side effects. Call your doctor for medical advice about side effects. You may report side effects to FDA at 1-800-FDA-1088. Where should I keep my medication? This drug is given in a hospital or clinic and will not be stored at home. NOTE: This sheet is a summary. It may not cover all possible  information. If you have questions about this medicine, talk to your doctor, pharmacist, or health care provider.  2022 Elsevier/Gold Standard (2017-10-11 00:00:00)  Carboplatin injection What is this medication? CARBOPLATIN (KAR boe pla tin) is a chemotherapy drug. It targets fast dividing cells, like cancer cells, and causes these cells to die. This medicine is used to treat ovarian cancer and many other cancers. This medicine may be used for other purposes; ask your health care provider or pharmacist if you have questions. COMMON BRAND NAME(S): Paraplatin What should I tell my care team before I take this medication? They need to know if you have any of these conditions: blood disorders hearing problems kidney disease recent or ongoing radiation therapy an unusual or allergic reaction to carboplatin, cisplatin, other chemotherapy, other medicines, foods, dyes, or preservatives pregnant or trying to get pregnant breast-feeding How should I use this medication? This drug is  usually given as an infusion into a vein. It is administered in a hospital or clinic by a specially trained health care professional. Talk to your pediatrician regarding the use of this medicine in children. Special care may be needed. Overdosage: If you think you have taken too much of this medicine contact a poison control center or emergency room at once. NOTE: This medicine is only for you. Do not share this medicine with others. What if I miss a dose? It is important not to miss a dose. Call your doctor or health care professional if you are unable to keep an appointment. What may interact with this medication? medicines for seizures medicines to increase blood counts like filgrastim, pegfilgrastim, sargramostim some antibiotics like amikacin, gentamicin, neomycin, streptomycin, tobramycin vaccines Talk to your doctor or health care professional before taking any of these  medicines: acetaminophen aspirin ibuprofen ketoprofen naproxen This list may not describe all possible interactions. Give your health care provider a list of all the medicines, herbs, non-prescription drugs, or dietary supplements you use. Also tell them if you smoke, drink alcohol, or use illegal drugs. Some items may interact with your medicine. What should I watch for while using this medication? Your condition will be monitored carefully while you are receiving this medicine. You will need important blood work done while you are taking this medicine. This drug may make you feel generally unwell. This is not uncommon, as chemotherapy can affect healthy cells as well as cancer cells. Report any side effects. Continue your course of treatment even though you feel ill unless your doctor tells you to stop. In some cases, you may be given additional medicines to help with side effects. Follow all directions for their use. Call your doctor or health care professional for advice if you get a fever, chills or sore throat, or other symptoms of a cold or flu. Do not treat yourself. This drug decreases your body's ability to fight infections. Try to avoid being around people who are sick. This medicine may increase your risk to bruise or bleed. Call your doctor or health care professional if you notice any unusual bleeding. Be careful brushing and flossing your teeth or using a toothpick because you may get an infection or bleed more easily. If you have any dental work done, tell your dentist you are receiving this medicine. Avoid taking products that contain aspirin, acetaminophen, ibuprofen, naproxen, or ketoprofen unless instructed by your doctor. These medicines may hide a fever. Do not become pregnant while taking this medicine. Women should inform their doctor if they wish to become pregnant or think they might be pregnant. There is a potential for serious side effects to an unborn child. Talk to your  health care professional or pharmacist for more information. Do not breast-feed an infant while taking this medicine. What side effects may I notice from receiving this medication? Side effects that you should report to your doctor or health care professional as soon as possible: allergic reactions like skin rash, itching or hives, swelling of the face, lips, or tongue signs of infection - fever or chills, cough, sore throat, pain or difficulty passing urine signs of decreased platelets or bleeding - bruising, pinpoint red spots on the skin, black, tarry stools, nosebleeds signs of decreased red blood cells - unusually weak or tired, fainting spells, lightheadedness breathing problems changes in hearing changes in vision chest pain high blood pressure low blood counts - This drug may decrease the number of white blood cells, red  blood cells and platelets. You may be at increased risk for infections and bleeding. nausea and vomiting pain, swelling, redness or irritation at the injection site pain, tingling, numbness in the hands or feet problems with balance, talking, walking trouble passing urine or change in the amount of urine Side effects that usually do not require medical attention (report to your doctor or health care professional if they continue or are bothersome): hair loss loss of appetite metallic taste in the mouth or changes in taste This list may not describe all possible side effects. Call your doctor for medical advice about side effects. You may report side effects to FDA at 1-800-FDA-1088. Where should I keep my medication? This drug is given in a hospital or clinic and will not be stored at home. NOTE: This sheet is a summary. It may not cover all possible information. If you have questions about this medicine, talk to your doctor, pharmacist, or health care provider.  2022 Elsevier/Gold Standard (2008-01-24 00:00:00)

## 2021-09-29 NOTE — Progress Notes (Signed)
[place]  [phone] [phone]   Name: [name] Date: 09/29/2021 MRN: [id number]  DOB: [birth year]  Patient Care Team: [name], MD as PCP - General (Family Medicine) [name], MD as PCP - Cardiology (Cardiology) Icard, Octavio Graves, DO as Consulting Physician (Pulmonary Disease) Valrie Hart, RN as Oncology Nurse Navigator (Oncology) Maryanna Shape, NP as Nurse Practitioner (Oncology) Curt Bears, MD as Consulting Physician (Oncology) Pickenpack-Cousar, Carlena Sax, NP as Nurse Practitioner (Nurse Practitioner)    INTERVAL HISTORY: [name] is a 66 y.o. 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 He was admitted on 08/29/2021 and 09/15/21 from home with uncontrolled intractable chest wall pain. He has completed all of his radiation treatments. Now with plans to undergo immunotherapy asa recommended by [facility].   SOCIAL HISTORY:     reports that he quit smoking about 5 years ago. His smoking use included cigarettes. He has a 33.00 pack-year smoking history. He has never used smokeless tobacco. He reports that he does not currently use alcohol. He reports that he does not use drugs.  ADVANCE DIRECTIVES:  None on file   CODE STATUS:   PAST MEDICAL HISTORY: Past Medical History:  Diagnosis Date   Anemia    Anxiety    Arthritis    Basal cell carcinoma (BCC) of forehead    CAD (coronary artery disease)    a. 10/2015 ant STEMI >> LHC with 3 v CAD; oLAD tx with POBA >> emergent CABG. b. Multiple evals since that  time, early graft failure of SVG-RCA by cath 03/2016. c. 2/19 PCI/DES x1 to pRCA, normal EF.   Carotid artery disease ([place])    a. 40-59% BICA 02/2018.   Depression    Dyspnea    Ectopic atrial tachycardia (HCC)    Esophageal reflux    eosinophil esophagitis   Family history of adverse reaction to anesthesia    "sister has PONV" (06/21/2017)   Former tobacco use    Gout    Hepatitis C    "treated and cured" (06/21/2017)   High cholesterol    History of blood transfusion    History of kidney stones    Hypertension    Ischemic cardiomyopathy    a. EF 25-30% at intraop TEE 4/17  //  b. Limited Echo 5/17 - EF 45-50%, mild ant HK. c. EF 55-65% by cath 09/2017.   Migraine    "3-4/yr" (06/21/2017)   Myocardial infarction ([place]) 10/2015   Palpitations    Pneumonia    Sinus bradycardia    a. HR dropping into 40s in 02/2016 -> BB reduced.   Stroke [facility]) 10/2016   "small one; sometimes my memory/cognitive issues" (06/21/2017)   Symptomatic hypotension    a. 02/2016 ER visit -> meds reduced.   Syncope    Wears dentures    Wears glasses     ALLERGIES:  is allergic to indomethacin, prednisone, tetanus toxoids, wellbutrin [bupropion], and varenicline.  MEDICATIONS:  Current Outpatient Medications  Medication Sig Dispense Refill   acetaminophen (TYLENOL) 500 MG tablet Take 1,000 mg by mouth every 6 (six) hours as needed for mild pain, fever  or headache.     aspirin EC 81 MG tablet Take 81 mg by mouth daily. Swallow whole.     docusate sodium (COLACE) 100 MG capsule Take 100-200 mg by mouth daily.     DULoxetine 40 MG CPEP Take 40 mg by mouth every evening. 30 capsule 1   folic acid (FOLVITE) 1 MG tablet Take 1 tablet (1 mg total) by mouth daily. 30 tablet 4   gabapentin (NEURONTIN) 100 MG capsule Take 1 capsule by mouth twice a day and 3 capsules at bedtime. (Patient taking differently: 200-300 mg See admin instructions. Take 2 capsule by mouth in the morning, then take  3 capsules by mouth at  bedtime.) 150 capsule 0   HYDROmorphone (DILAUDID) 4 MG tablet Take 1 tablet (4 mg total) by mouth every 3 (three) hours as needed for up to 7 days for severe pain. 56 tablet 0   lidocaine-prilocaine (EMLA) cream Apply to the Port-A-Cath site 30-60-minute before treatment 30 g 0   metoprolol succinate (TOPROL-XL) 25 MG 24 hr tablet Take 1 tablet (25 mg total) by mouth daily. (Patient taking differently: Take 25 mg by mouth every evening.) 90 tablet 3   morphine (MS CONTIN) 60 MG 12 hr tablet Take 1 tablet (60 mg total) by mouth every 8 (eight) hours for 14 days. 42 tablet 0   Multiple Vitamin (MULTIVITAMIN WITH MINERALS) TABS tablet Take 1 tablet by mouth daily.     ondansetron (ZOFRAN) 8 MG tablet Take 1 tablet (8 mg total) by mouth every 8 (eight) hours as needed for nausea or vomiting. Starting 3 days after chemotherapy 90 tablet 2   polyethylene glycol (MIRALAX / GLYCOLAX) 17 g packet Take 17 g by mouth daily. 14 each 0   prochlorperazine (COMPAZINE) 10 MG tablet Take 1 tablet (10 mg total) by mouth every 6 (six) hours as needed for nausea or vomiting. 30 tablet 0   ranolazine (RANEXA) 1000 MG SR tablet Take 1 tablet (1,000 mg total) by mouth 2 (two) times daily. 180 tablet 3   tiZANidine (ZANAFLEX) 2 MG tablet Take 1 tablet (2 mg total) by mouth at bedtime as needed for muscle spasms. (Patient taking differently: Take 2 mg by mouth every evening.) 30 tablet 0   XARELTO 20 MG TABS tablet Take 20 mg by mouth every evening.     No current facility-administered medications for this visit.   Facility-Administered Medications Ordered in Other Visits  Medication Dose Route Frequency Provider Last Rate Last Admin   sodium chloride flush (NS) 0.9 % injection 10 mL  10 mL Intracatheter PRN Curt Bears, MD   10 mL at 09/29/21 1616    VITAL SIGNS: There were no vitals taken for this visit. There were no vitals filed for this visit.  Estimated body mass index is 25 kg/m as calculated from the  following:   Height as of an earlier encounter on 09/29/21: 5\' 9"  (1.753 m).   Weight as of an earlier encounter on 09/29/21: 169 lb 4.8 oz (76.8 kg).   PERFORMANCE STATUS (ECOG) : 1 - Symptomatic but completely ambulatory   Physical Exam General: NAD Pulmonary: normal breathing pattern  Neurological: AAOx 4, appears comfortable  IMPRESSION:  Mr. Viner is here for follow-up visit s/p recent hospitalization due to uncontrolled pain. During his recent admission he was followed by my Palliative colleagues for additional support.   I saw patient during his infusion today. Sitting up in the recliner. Appears comfortable.   Neoplasm related pain During  hospitalization his pain regimen was adjusted to gain better and continuous control. Hopefully to prevent any further hospitalizations. He is currently taking MS Contin 60 mg every 8 hrs with oral Dilaudid 4mg  every 3-4 hours as needed for breakthrough pain. Continues on gabapentin 100mg  twice daily and 300 mg at bedtime. Lidocaine patches to painful areas. He reports although his pain has not completely resolve he is much more comfortable and pain is controlled. He shares some days are better than others. Unfortunately given his extensive disease this will be ongoing and realistic expectations are to gain somewhat better control with awareness pain will not completely resolve. If pain further escalates consideration for a more comfort hospice focus would be most appropriate in order to provide him most sufficient pain control. Will continue to closely monitor and support in any way that we can.   Constipation No concerns at this time. Will continue on current bowel regimen of colace and miralax daily to prevent constipation in the setting of opioid use. States he must take on schedule.  Decreased appetite/weight loss Mr. Nishiyama reports appetite is improved. Some days better than others but he is able to tolerate meals and snacks. Denies any  concerns at this time with nutrition. Current weight 169 lbs down from 174lbs on 1/16.   Goals of care Bobby is aware that his cancer is He is remaining hopeful for some stability and extended time with his current treatment options.   PLAN: MS Contin ER 60 mg every 8 hours (new prescription will be sent in for 30 day supply on 10/02/21 (was given a 14 day supply at discharge on 1/22. Next refill due on 10/31/2021).  Dilaudid 4 mg every 3-4 hrs as needed for breakthrough pain. (Refills to be sent in on 10/02/21. Also given prescription at discharge. Next RX will be due on  10/31/21).  Gabapentin 300 mg QHS and 100 mg BID (refill sent today. Next due 10/09/2021) Tizanidine 2mg  QHS as needed for muscle spasms. (Refills will be due on 10/09/21), Miralax and Colace daily for bowel regimen  I will plan to see him back in 3-4 weeks in collaboration with his other oncology appointments.    Patient expressed understanding and was in agreement with this plan. He also understands that He can call the clinic at any time with any questions, concerns, or complaints.   Time Total: 30 min  Visit consisted of counseling and education dealing with the complex and emotionally intense issues of symptom management and palliative care in the setting of serious and potentially life-threatening illness.Greater than 50%  of this time was spent counseling and coordinating care related to the above assessment and plan.  Signed by: Alda Lea, AGPCNP-BC [place]

## 2021-09-30 ENCOUNTER — Telehealth: Payer: Self-pay

## 2021-09-30 ENCOUNTER — Other Ambulatory Visit: Payer: Self-pay | Admitting: Nurse Practitioner

## 2021-09-30 NOTE — Telephone Encounter (Signed)
-----   Message from Lester Hartselle, RN sent at 09/29/2021  4:27 PM EST ----- Regarding: Dr. [name] Pt. first time Pembrolizumab, Pemetrexed, and Carboplatin. Tolerated well, no issues noted.

## 2021-09-30 NOTE — Telephone Encounter (Signed)
[name] states that he is eating, drinking and urinating well. No N/V.  He started with some heart burn last evening. He took Gaviscon with no relief. Suggested he try a compazine tablet.  Some times an upset stomach can be like heart burn.   He could try Prilosec OTC.  If the symptoms progress or do not resolve, he needs to call Dr. Worthy Flank nurse for recommendations. He is feeling fatigued which is to be expected. Encouraged him to move and be active when he can and rest when he needs to and to be sure he take in 64 oz of caffeine free fluid. Pt verbalized understanding. He knows to call the [phone] if he has any questions or concerns.

## 2021-10-01 ENCOUNTER — Telehealth: Payer: Self-pay | Admitting: Nurse Practitioner

## 2021-10-01 ENCOUNTER — Inpatient Hospital Stay: Payer: Medicare Other | Attending: Physician Assistant | Admitting: Internal Medicine

## 2021-10-01 ENCOUNTER — Encounter: Payer: Self-pay | Admitting: Physician Assistant

## 2021-10-01 ENCOUNTER — Other Ambulatory Visit: Payer: Self-pay

## 2021-10-01 ENCOUNTER — Other Ambulatory Visit (HOSPITAL_COMMUNITY): Payer: Self-pay

## 2021-10-01 ENCOUNTER — Encounter: Payer: Medicare Other | Admitting: Nurse Practitioner

## 2021-10-01 VITALS — BP 116/69 | HR 81 | Temp 97.3°F | Resp 20 | Wt 171.4 lb

## 2021-10-01 DIAGNOSIS — C7A1 Malignant poorly differentiated neuroendocrine tumors: Secondary | ICD-10-CM | POA: Insufficient documentation

## 2021-10-01 DIAGNOSIS — Z79899 Other long term (current) drug therapy: Secondary | ICD-10-CM | POA: Insufficient documentation

## 2021-10-01 DIAGNOSIS — C7931 Secondary malignant neoplasm of brain: Secondary | ICD-10-CM | POA: Diagnosis not present

## 2021-10-01 DIAGNOSIS — G893 Neoplasm related pain (acute) (chronic): Secondary | ICD-10-CM | POA: Diagnosis not present

## 2021-10-01 DIAGNOSIS — G587 Mononeuritis multiplex: Secondary | ICD-10-CM

## 2021-10-01 DIAGNOSIS — Z5112 Encounter for antineoplastic immunotherapy: Secondary | ICD-10-CM | POA: Insufficient documentation

## 2021-10-01 DIAGNOSIS — Z5111 Encounter for antineoplastic chemotherapy: Secondary | ICD-10-CM | POA: Insufficient documentation

## 2021-10-01 DIAGNOSIS — R52 Pain, unspecified: Secondary | ICD-10-CM | POA: Diagnosis not present

## 2021-10-01 MED ORDER — MORPHINE SULFATE ER 60 MG PO TBCR
60.0000 mg | EXTENDED_RELEASE_TABLET | Freq: Three times a day (TID) | ORAL | 0 refills | Status: DC
  Filled 2021-10-02: qty 90, 30d supply, fill #0

## 2021-10-01 MED ORDER — HYDROMORPHONE HCL 4 MG PO TABS
4.0000 mg | ORAL_TABLET | ORAL | 0 refills | Status: DC | PRN
  Filled 2021-10-01: qty 120, 15d supply, fill #0

## 2021-10-01 MED ORDER — GABAPENTIN 100 MG PO CAPS
ORAL_CAPSULE | ORAL | 0 refills | Status: DC
  Filled 2021-10-01: qty 150, 30d supply, fill #0

## 2021-10-01 NOTE — Progress Notes (Signed)
[facility] [place] [facility] [place], [place] [address] [phone]   New Patient Evaluation  Date of Service: 10/01/21 Patient Name: [name] Patient MRN: [id number] Patient DOB: [birth year] Provider: Ventura Sellers, MD  Identifying Statement:  [name] is a 66 y.o. male with Secondary malignant neoplasm of brain [facility]) who presents for initial consultation and evaluation regarding cancer associated neurologic deficits.    Referring Provider: [name], MD [address] 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[place])   02/09/2021 - 04/24/2021 Chemotherapy   Patient is on Treatment Plan : LUNG SMALL CELL - LIMITED STAGE Cisplatin D1 + Etoposide D1-3 q21d     09/29/2021 -  Chemotherapy   Patient is on Treatment Plan : LUNG Carboplatin (5) + Pemetrexed (500) + Pembrolizumab (200) D1 q21d Induction x 4 cycles / Maintenance Pemetrexed (500) + Pembrolizumab (200) D1 q21d     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[place])  01/14/2021 Initial Diagnosis   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[place])   01/29/2021 Cancer Staging   Staging form: Lung, AJCC 8th Edition - Clinical: Stage IVA (cT1c, cN1, cM1b) - Signed by Curt Bears, MD on 01/29/2021    09/29/2021 -  Chemotherapy   Patient is on Treatment Plan : LUNG Carboplatin (5) + Pemetrexed (500) + Pembrolizumab (200) D1 q21d Induction x 4 cycles / Maintenance Pemetrexed (500) + Pembrolizumab (200) D1 q21d      CNS Oncologic History 02/18/21: SRS to solitary brain met [name]) 08/26/21: Salvage SRS x4 [name])  History of Present Illness: The patient's records from the referring physician were obtained and reviewed and the patient interviewed to confirm this HPI.  [name] presents to  review neurologic complaints, following recent salvage SRS.  He describes episodes or sustained periods of numbness, tingling affecting the "little two fingers on my left hand", and radiating up the arm.  At times the "outside part of the left foot" is also numb and tingling.  No weakness, no shaking described, though episodes are occurring more frequenly in recent weeks.  He does also complain of "sudden jerking movements" involving whole body, often when drowsy or approaching sleep.  This has been occurring almost daily.  No post event confusion, weakness.  Resumed chemotherapy this week with Dr. [name] for systemic progression.  Medications: Current Outpatient Medications on File Prior to Visit  Medication Sig Dispense Refill   acetaminophen (TYLENOL) 500 MG tablet Take 1,000 mg by mouth every 6 (six) hours as needed for mild pain, fever or headache.     aspirin EC 81 MG tablet Take 81 mg by mouth daily. Swallow whole.     docusate sodium (COLACE) 100 MG capsule Take 100-200 mg by mouth daily.     DULoxetine 40 MG CPEP Take 40 mg by mouth every evening. 30 capsule 1   folic acid (FOLVITE) 1 MG tablet Take 1 tablet (1 mg total) by mouth daily. 30 tablet 4   gabapentin (NEURONTIN) 100 MG capsule Take 1 capsule by mouth twice a day and 3 capsules at bedtime. 150 capsule 0   HYDROmorphone (DILAUDID) 4 MG tablet Take 1 tablet (4 mg total) by mouth every 3 (three) hours as needed for severe pain. 120 tablet 0   lidocaine-prilocaine (EMLA) cream Apply to the Port-A-Cath site 30-60-minute  before treatment 30 g 0   metoprolol succinate (TOPROL-XL) 25 MG 24 hr tablet Take 1 tablet (25 mg total) by mouth daily. (Patient taking differently: Take 25 mg by mouth every evening.) 90 tablet 3   [START ON 10/02/2021] morphine (MS CONTIN) 60 MG 12 hr tablet Take 1 tablet (60 mg total) by mouth every 8 (eight) hours. **10/02/2021 90 tablet 0   Multiple Vitamin (MULTIVITAMIN WITH MINERALS) TABS tablet Take 1 tablet by  mouth daily.     ondansetron (ZOFRAN) 8 MG tablet Take 1 tablet (8 mg total) by mouth every 8 (eight) hours as needed for nausea or vomiting. Starting 3 days after chemotherapy 90 tablet 2   polyethylene glycol (MIRALAX / GLYCOLAX) 17 g packet Take 17 g by mouth daily. 14 each 0   prochlorperazine (COMPAZINE) 10 MG tablet Take 1 tablet (10 mg total) by mouth every 6 (six) hours as needed for nausea or vomiting. 30 tablet 0   ranolazine (RANEXA) 1000 MG SR tablet Take 1 tablet (1,000 mg total) by mouth 2 (two) times daily. 180 tablet 3   tiZANidine (ZANAFLEX) 2 MG tablet Take 1 tablet (2 mg total) by mouth at bedtime as needed for muscle spasms. (Patient taking differently: Take 2 mg by mouth every evening.) 30 tablet 0   XARELTO 20 MG TABS tablet Take 20 mg by mouth every evening.     No current facility-administered medications on file prior to visit.    Allergies:  Allergies  Allergen Reactions   Indomethacin Other (See Comments)    rectal bleeding   Prednisone Other (See Comments)    States that this med makes him "crazy"   Tetanus Toxoids Swelling and Other (See Comments)    Fever, Swelling of the arm    Wellbutrin [Bupropion] Other (See Comments)    Crazy thoughts, nightmares   Varenicline Other (See Comments)    Dreams  Other reaction(s): Unknown   Past Medical History:  Past Medical History:  Diagnosis Date   Anemia    Anxiety    Arthritis    Basal cell carcinoma (BCC) of forehead    CAD (coronary artery disease)    a. 10/2015 ant STEMI >> LHC with 3 v CAD; oLAD tx with POBA >> emergent CABG. b. Multiple evals since that time, early graft failure of SVG-RCA by cath 03/2016. c. 2/19 PCI/DES x1 to pRCA, normal EF.   Carotid artery disease ([place])    a. 40-59% BICA 02/2018.   Depression    Dyspnea    Ectopic atrial tachycardia (HCC)    Esophageal reflux    eosinophil esophagitis   Family history of adverse reaction to anesthesia    "sister has PONV" (06/21/2017)   Former  tobacco use    Gout    Hepatitis C    "treated and cured" (06/21/2017)   High cholesterol    History of blood transfusion    History of kidney stones    Hypertension    Ischemic cardiomyopathy    a. EF 25-30% at intraop TEE 4/17  //  b. Limited Echo 5/17 - EF 45-50%, mild ant HK. c. EF 55-65% by cath 09/2017.   Migraine    "3-4/yr" (06/21/2017)   Myocardial infarction ([place]) 10/2015   Palpitations    Pneumonia    Sinus bradycardia    a. HR dropping into 40s in 02/2016 -> BB reduced.   Stroke [facility]) 10/2016   "small one; sometimes my memory/cognitive issues" (06/21/2017)   Symptomatic hypotension  a. 02/2016 ER visit -> meds reduced.   Syncope    Wears dentures    Wears glasses    Past Surgical History:  Past Surgical History:  Procedure Laterality Date   25 GAUGE PARS PLANA VITRECTOMY WITH 20 GAUGE MVR PORT  12/31/2020   Procedure: 25 GAUGE PARS PLANA VITRECTOMY WITH 20 GAUGE MVR PORT;  Surgeon: [name], MD;  [facility]: [facility];  Service: Thoracic;;   ANTERIOR CERVICAL DECOMP/DISCECTOMY FUSION N/A 10/17/2018   Procedure: Anterior Cervical Decompression Fusion - Cervical seven -Thoracic one;  Surgeon: Consuella Lose, MD;  Location: [place];  Service: Neurosurgery;  Laterality: N/A;   BASAL CELL CARCINOMA EXCISION     "forehead   BIOPSY  07/20/2019   Procedure: BIOPSY;  Surgeon: Carol Ada, MD;  Location: [facility];  Service: Endoscopy;;   BIOPSY OF MEDIASTINAL MASS Right 07/29/2021   Procedure: RIGHT CHEST WALL MASS BIOPSY;  Surgeon: [name], MD;  Location: [place];  Service: Thoracic;  Laterality: Right;   BRONCHIAL BIOPSY  12/24/2020   Procedure: BRONCHIAL BIOPSIES;  Surgeon: Garner Nash, DO;  Location: [place] [facility];  Service: Pulmonary;;   BRONCHIAL BRUSHINGS  12/24/2020   Procedure: BRONCHIAL BRUSHINGS;  Surgeon: Garner Nash, DO;  Location: [place];  Service: Pulmonary;;   BRONCHIAL NEEDLE ASPIRATION BIOPSY  12/24/2020   Procedure:  BRONCHIAL NEEDLE ASPIRATION BIOPSIES;  Surgeon: Garner Nash, DO;  Location: [place];  Service: Pulmonary;;   CARDIAC CATHETERIZATION N/A 11/28/2015   Procedure: Left Heart Cath and Coronary Angiography;  Surgeon: [name], MD;  Location: [place] CV [facility];  Service: Cardiovascular;  Laterality: N/A;   CARDIAC CATHETERIZATION N/A 11/28/2015   Procedure: Coronary Balloon Angioplasty;  Surgeon: [name], MD;  Location: [place] [facility];  Service: Cardiovascular;  Laterality: N/A;  ostial LAD   CARDIAC CATHETERIZATION N/A 11/28/2015   Procedure: Coronary/Graft Angiography;  Surgeon: [name], MD;  Location: [place] [facility];  Service: Cardiovascular;  Laterality: N/A;  coronaries only    CARDIAC CATHETERIZATION N/A 04/21/2016   Procedure: Left Heart Cath and Coronary Angiography;  Surgeon: Wellington Hampshire, MD;  Location: [place] [facility];  Service: Cardiovascular;  Laterality: N/A;   CARDIAC CATHETERIZATION N/A 06/14/2016   Procedure: Left Heart Cath and Cors/Grafts Angiography;  Surgeon: Lorretta Harp, MD;  Location: [place] [facility];  Service: Cardiovascular;  Laterality: N/A;   CARDIAC CATHETERIZATION N/A 09/08/2016   Procedure: Left Heart Cath and Cors/Grafts Angiography;  Surgeon: Wellington Hampshire, MD;  Location: [place] [facility];  Service: Cardiovascular;  Laterality: N/A;   CARDIAC CATHETERIZATION     CORONARY ARTERY BYPASS GRAFT N/A 11/28/2015   Procedure: CORONARY ARTERY BYPASS GRAFTING (CABG) TIMES FIVE USING LEFT INTERNAL MAMMARY ARTERY AND RIGHT GREATER SAPHENOUS,VIEN HARVEATED BY ENDOVIEN, INTRAOPPRATIVE TEE;  Surgeon: Gaye Pollack, MD;  Location: [place];  Service: Open Heart Surgery;  Laterality: N/A;   CORONARY STENT INTERVENTION N/A 10/05/2017   Procedure: CORONARY STENT INTERVENTION;  Surgeon: [name], MD;  Location: [place] [facility];  Service: Cardiovascular;  Laterality: N/A;   ESOPHAGOGASTRODUODENOSCOPY (EGD) WITH  PROPOFOL N/A 07/20/2019   Procedure: ESOPHAGOGASTRODUODENOSCOPY (EGD) WITH PROPOFOL;  Surgeon: Carol Ada, MD;  [facility];  Service: Endoscopy;  Laterality: N/A;   ESOPHAGOGASTRODUODENOSCOPY (EGD) WITH PROPOFOL N/A 01/30/2021   Procedure: ESOPHAGOGASTRODUODENOSCOPY (EGD) WITH PROPOFOL;  Surgeon: Carol Ada, MD;  [facility];  Service: Endoscopy;  Laterality: N/A;   FIDUCIAL MARKER  PLACEMENT  12/24/2020   Procedure: FIDUCIAL DYE MARKER PLACEMENT;  Surgeon: Garner Nash, DO;  Location: [place] [facility];  Service: Pulmonary;;   HUMERUS SURGERY Right 1969   "tumor inside bone; filled it w/bone chips"   INTERCOSTAL NERVE BLOCK Right 12/24/2020   Procedure: INTERCOSTAL NERVE BLOCK;  Surgeon: [name], MD;  Location: [place];  Service: Thoracic;  Laterality: Right;   IR IMAGING GUIDED PORT INSERTION  02/06/2021   LEFT HEART CATH AND CORS/GRAFTS ANGIOGRAPHY N/A 03/11/2017   Procedure: Left Heart Cath and Cors/Grafts Angiography;  Surgeon: Leonie Man, MD;  Location: [place] [facility];  Service: Cardiovascular;  Laterality: N/A;   LEFT HEART CATH AND CORS/GRAFTS ANGIOGRAPHY N/A 10/05/2017   Procedure: LEFT HEART CATH AND CORS/GRAFTS ANGIOGRAPHY;  Surgeon: [name], MD;  Location: [place] [facility];  Service: Cardiovascular;  Laterality: N/A;   LEFT HEART CATH AND CORS/GRAFTS ANGIOGRAPHY N/A 04/11/2019   Procedure: LEFT HEART CATH AND CORS/GRAFTS ANGIOGRAPHY;  Surgeon: [name], MD;  Location: [place] [facility];  Service: Cardiovascular;  Laterality: N/A;   NODE DISSECTION Right 12/24/2020   Procedure: NODE DISSECTION;  Surgeon: [name], MD;  Location: [place];  Service: Thoracic;  Laterality: Right;   PERIPHERAL VASCULAR CATHETERIZATION N/A 06/14/2016   Procedure: Lower Extremity Angiography;  Surgeon: Lorretta Harp, MD;  Location: [place] [facility];  Service: Cardiovascular;  Laterality: N/A;   VIDEO BRONCHOSCOPY WITH  ENDOBRONCHIAL NAVIGATION Right 12/24/2020   Procedure: VIDEO BRONCHOSCOPY WITH ENDOBRONCHIAL NAVIGATION;  Surgeon: Garner Nash, DO;  Location: [place];  Service: Pulmonary;  Laterality: Right;   VIDEO BRONCHOSCOPY WITH ENDOBRONCHIAL ULTRASOUND N/A 12/24/2020   Procedure: VIDEO BRONCHOSCOPY WITH ENDOBRONCHIAL ULTRASOUND;  Surgeon: Garner Nash, DO;  Location: [place];  Service: Pulmonary;  Laterality: N/A;   VIDEO BRONCHOSCOPY WITH INSERTION OF INTERBRONCHIAL VALVE (IBV) N/A 12/31/2020   Procedure: VIDEO BRONCHOSCOPY WITH INSERTION OF INTERBRONCHIAL VALVE (IBV).VALVE IN CARTRIDGE 46mm,9mm. CHEST TUBE PLACEMENT.;  Surgeon: [name], MD;  [facility];  Service: Thoracic;  Laterality: N/A;   VIDEO BRONCHOSCOPY WITH INSERTION OF INTERBRONCHIAL VALVE (IBV) N/A 07/29/2021   Procedure: VIDEO BRONCHOSCOPY WITH REMOVAL OF INTERBRONCHIAL VALVE (IBV);  Surgeon: [name], MD;  Location: [facility] OR;  Service: Thoracic;  Laterality: N/A;   Social History:  Social History   Socioeconomic History   Marital status: Married    Spouse name: [name]   Number of children: 3   Years of education: [employer] education level: Not on file  Occupational History   Occupation: Scientist, research (physical sciences): SELF-EMPLOYED  Tobacco Use   Smoking status: Former    Packs/day: 0.75    Years: 44.00    Pack years: 33.00    Types: Cigarettes    Quit date: 11/28/2015    Years since quitting: 5.8   Smokeless tobacco: Never  Vaping Use   Vaping Use: Never used  Substance and Sexual Activity   Alcohol use: Not Currently   Drug use: No    Comment: 06/21/2017 "nothing since the 1980s"   Sexual activity: Yes  Other Topics Concern   Not on file  Social History Narrative   Patient lives at home with his spouse.   Caffeine Use: yes   Social Determinants of Health   Financial Resource Strain: Not on file  Food Insecurity: Not on file  Transportation Needs: Not on file  Physical  Activity: Not on file  Stress: Not on  file  Social Connections: Not on file  Intimate Partner Violence: Not At Risk   Fear of Current or Ex-Partner: No   Emotionally Abused: No   Physically Abused: No   Sexually Abused: No   Family History:  Family History  Problem Relation Age of Onset   Lung cancer Mother    Heart Problems Father    Heart attack Father 44   Stroke Father    Heart failure Father    Heart attack Maternal Grandmother    Stroke Maternal Grandmother    Heart attack Paternal Uncle    Hypertension Brother    Autoimmune disease Neg Hx     Review of Systems: Constitutional: Doesn't report fevers, chills or abnormal weight loss Eyes: Doesn't report blurriness of vision Ears, nose, mouth, throat, and face: Doesn't report sore throat Respiratory: Doesn't report cough, dyspnea or wheezes Cardiovascular: Doesn't report palpitation, chest discomfort  Gastrointestinal:  Doesn't report nausea, constipation, diarrhea GU: Doesn't report incontinence Skin: Doesn't report skin rashes Neurological: Per HPI Musculoskeletal: Doesn't report joint pain Behavioral/Psych: Doesn't report anxiety  Physical Exam: Vitals:   10/01/21 1105  BP: 116/69  Pulse: 81  Resp: 20  Temp: (!) 97.3 F (36.3 C)  SpO2: 100%   KPS: 90. General: Alert, cooperative, pleasant, in no acute distress Head: Normal EENT: No conjunctival injection or scleral icterus.  Lungs: Resp effort normal Cardiac: Regular rate Abdomen: Non-distended abdomen Skin: No rashes cyanosis or petechiae. Extremities: No clubbing or edema  Neurologic Exam: Mental Status: Awake, alert, attentive to examiner. Oriented to self and environment. Language is fluent with intact comprehension.  Cranial Nerves: Visual acuity is grossly normal. Visual fields are full. Extra-ocular movements intact. No ptosis. Face is symmetric Motor: Tone and bulk are normal. Power is full in both arms and legs. Polyminimyoclonus noted.  Reflexes are symmetric, no pathologic reflexes present.  Sensory: Intact to light touch Gait: Normal.   Labs: I have reviewed the data as listed    Component Value Date/Time   NA 137 09/29/2021 1130   K 3.9 09/29/2021 1130   CL 103 09/29/2021 1130   CO2 28 09/29/2021 1130   GLUCOSE 90 09/29/2021 1130   BUN 15 09/29/2021 1130   CREATININE 0.99 09/29/2021 1130   CREATININE 1.00 02/12/2016 1038   CALCIUM 10.3 09/29/2021 1130   PROT 7.2 09/29/2021 1130   PROT 6.7 11/08/2017 0833   ALBUMIN 3.8 09/29/2021 1130   ALBUMIN 4.4 11/08/2017 0833   AST 14 (L) 09/29/2021 1130   ALT 9 09/29/2021 1130   ALKPHOS 74 09/29/2021 1130   BILITOT 0.4 09/29/2021 1130   GFRNONAA >60 09/29/2021 1130   GFRAA >60 05/27/2020 1039   Lab Results  Component Value Date   WBC 7.1 09/29/2021   NEUTROABS 5.4 09/29/2021   HGB 10.7 (L) 09/29/2021   HCT 32.9 (L) 09/29/2021   MCV 91.6 09/29/2021   PLT 174 09/29/2021    Imaging:  DG Chest 2 View  Result Date: 09/14/2021 CLINICAL DATA:  Right lower abdominal pain with cough and nausea. EXAM: CHEST - 2 VIEW COMPARISON:  August 29, 2021 FINDINGS: There is stable right-sided venous Port-A-Cath positioning. Multiple sternal wires and vascular clips are seen. Mild atelectasis is noted within the right lung base. A small right pleural effusion is present. No pneumothorax is identified. The heart size and mediastinal contours are within normal limits. A radiopaque fusion plate and screws are seen overlying the lower cervical spine. The visualized skeletal structures are otherwise unremarkable. IMPRESSION: 1.  Stable postoperative changes with mild right basilar atelectasis. 2. Small right pleural effusion. Electronically Signed   By: Virgina Norfolk M.D.   On: 09/14/2021 23:17   DG Ribs Unilateral W/Chest Right  Result Date: 09/16/2021 CLINICAL DATA:  Right posterior rib fracture, right flank pain, history of metastatic disease EXAM: RIGHT RIBS AND CHEST - 3+ VIEW  COMPARISON:  09/15/2021, 09/14/2021 FINDINGS: Frontal view of the chest as well as frontal and oblique views of the right thoracic cage are obtained. Cardiac silhouette is stable. Postsurgical changes from median sternotomy. Right chest wall port unchanged. Small right pleural effusion.  No airspace disease or pneumothorax. Lytic lesion posterior right eleventh rib with associated pathologic fracture identified, better visualized on recent CT. The other smaller lytic lesions throughout the remainder of the thoracic cage are not as well visualized by radiograph. IMPRESSION: 1. Lytic lesion and pathologic fracture posterior right eleventh rib. 2. Small right pleural effusion. 3. Please refer to recent CT report describing numerous other small lytic lesions within the right thoracic cage. Electronically Signed   By: Randa Ngo M.D.   On: 09/16/2021 22:02   CT Angio Chest PE W/Cm &/Or Wo Cm  Result Date: 09/15/2021 CLINICAL DATA:  Dyspnea with right-sided abdominal pain and flank pain. EXAM: CT ANGIOGRAPHY CHEST WITH CONTRAST TECHNIQUE: Multidetector CT imaging of the chest was performed using the standard protocol during bolus administration of intravenous contrast. Multiplanar CT image reconstructions and MIPs were obtained to evaluate the vascular anatomy. RADIATION DOSE REDUCTION: This exam was performed according to the departmental dose-optimization program which includes automated exposure control, adjustment of the mA and/or kV according to patient size and/or use of iterative reconstruction technique. CONTRAST:  127m OMNIPAQUE IOHEXOL 350 MG/ML SOLN COMPARISON:  August 18, 2021 FINDINGS: Cardiovascular: A right-sided venous Port-A-Cath is in place. There is mild to moderate severity calcification of the aortic arch. Satisfactory opacification of the pulmonary arteries to the segmental level. No evidence of pulmonary embolism. Normal heart size. No pericardial effusion. Mediastinum/Nodes: No enlarged  mediastinal, hilar, or axillary lymph nodes. Thyroid gland, trachea, and esophagus demonstrate no significant findings. Lungs/Pleura: A 12 mm x 11 mm noncalcified lung nodule is seen within the posteromedial aspect of the left upper lobe (axial CT image 29, CT series 4). This measures approximately 11 mm x 9 mm on the prior study. 6 mm and 4 mm noncalcified lung nodules are seen within the posterior aspect of the right upper lobe (axial CT images 37 and 46, CT series 2). These are new findings when compared to the prior study. New 3 mm and 6 mm noncalcified lung nodules are seen within the posterolateral aspects of the right lower lobe (axial CT images 52, 59 and 66, CT series 4). Mild atelectatic changes are seen within the posterior aspect of the right lung base. A very small right pleural effusion is seen. No pneumothorax is identified. Upper Abdomen: A stable simple cyst is seen within the left kidney. Musculoskeletal: Multiple sternal wires are present. Lytic lesions are again seen involving multiple right ribs. A pathologic fracture of the posterior aspect of the eleventh right rib is again seen. There has been interval enlargement of the soft tissue lesions seen within the posterior right chest wall with pleural invasion along the posteromedial aspect of the right lung base. A metallic density fixation plate and screws are seen along the anterior aspect of the lower cervical spine. The level degenerative changes are seen throughout the thoracic spine. Review of the MIP images  confirms the above findings. IMPRESSION: 1. No evidence for pulmonary embolism. 2. Very mild interval increase in size of the previously noted left upper lobe lung nodule, with multiple new bilateral noncalcified lung nodules, consistent with pulmonary metastasis. 3. Very small right pleural effusion. 4. Interval enlargement of the soft tissue mass is seen within the posterior right chest wall with pleural invasion along right lung base.  5. Multiple right-sided lytic lesions with a pathologic fracture of the posterior eleventh right rib. Aortic Atherosclerosis (ICD10-I70.0). Electronically Signed   By: Virgina Norfolk M.D.   On: 09/15/2021 00:49   CT Abdomen Pelvis W Contrast  Result Date: 09/15/2021 CLINICAL DATA:  Right-sided abdominal pain and flank pain. EXAM: CT ABDOMEN AND PELVIS WITH CONTRAST TECHNIQUE: Multidetector CT imaging of the abdomen and pelvis was performed using the standard protocol following bolus administration of intravenous contrast. RADIATION DOSE REDUCTION: This exam was performed according to the departmental dose-optimization program which includes automated exposure control, adjustment of the mA and/or kV according to patient size and/or use of iterative reconstruction technique. CONTRAST:  155m OMNIPAQUE IOHEXOL 350 MG/ML SOLN COMPARISON:  May 05, 2021 FINDINGS: Lower chest: Multiple subcentimeter noncalcified lung nodules are seen within the posterolateral aspect of the left lung base. Mild posterior right basilar scarring and/or atelectasis is noted. A small right pleural effusion is seen. Hepatobiliary: Punctate foci of parenchymal low attenuation are seen within the anterior aspect of the liver dome and inferior aspect of the right lobe of the liver (axial CT images 10 and 32, CT series 5). These are too small to characterize by CT examination. No gallstones, gallbladder wall thickening, or biliary dilatation. Pancreas: Unremarkable. No pancreatic ductal dilatation or surrounding inflammatory changes. Spleen: Normal in size without focal abnormality. Adrenals/Urinary Tract: Adrenal glands are unremarkable. Kidneys are normal in size, without renal calculi or hydronephrosis. A stable simple cyst is seen within the anterior aspect of the mid left kidney. Bladder is unremarkable. Stomach/Bowel: Stomach is within normal limits. Appendix appears normal. No evidence of bowel wall thickening, distention, or  inflammatory changes. Noninflamed diverticula are seen within the sigmoid colon. Vascular/Lymphatic: Aortic atherosclerosis. No enlarged abdominal or pelvic lymph nodes. Reproductive: The prostate gland is mildly enlarged. Other: No abdominal wall hernia or abnormality. No abdominopelvic ascites. Musculoskeletal: Multiple soft tissue masses are seen within the musculature and subcutaneous fat of the lateral and posterior aspects of the lower right chest wall. Pleural invasion is noted within the posterior aspect of the right lung base. These soft tissue masses extend along the posterior wall of the mid and upper right abdomen. This represents a new finding. Lytic lesions are seen involving multiple right ribs with a pathologic fracture noted involving the posterior eleventh right rib. Degenerative changes are seen within the lumbar spine. IMPRESSION: 1. Multiple neoplastic soft tissue masses within the lateral and posterior aspects of the lower right chest wall with associated pleural invasion. 2. Lytic lesions involving multiple right ribs with a pathologic fracture of the posterior eleventh right rib. 3. Multiple subcentimeter noncalcified lung nodules within the left lung base, consistent with pulmonary metastatic disease. 4. Sigmoid diverticulosis. 5. Small right pleural effusion. 6. Aortic atherosclerosis. Aortic Atherosclerosis (ICD10-I70.0). Electronically Signed   By: TVirgina NorfolkM.D.   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  Syndrome is not in typical pattern  temporally or spatially seen with chemotherapy associated length dependent polyneuropathy.  Etiology is unclear, but would need to account for upper and lower peripheral nerves or roots.  Given active and progressive small cell carcinoma, suspicion for  possible leptomeningeal dissemination should be acknowledged, even if unlikely.  Otherwise this could be attributed to prior chemo toxicity.    Recommended MRI total spine mets screening protocol due to concern for LMD.  He is agreeable with this plan.    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      We spent twenty additional minutes teaching regarding the natural history, biology, and historical experience in the treatment of neurologic complications of cancer.   We appreciate the opportunity to participate in the care of JOHANNES EVERAGE.  We will review his MRI in brain/spine tumor board and call him to discuss the results.  All questions were answered. The patient knows to call the clinic with any problems, questions or concerns. No barriers to learning were detected.  The total time spent in the encounter was 40 minutes and more than 50% was on counseling and review of test results   Ventura Sellers, MD Medical Director of Neuro-Oncology [facility] [place] 10/01/21 1:49 PM

## 2021-10-01 NOTE — Telephone Encounter (Signed)
Scheduled per 1/31 los, pt has been called and confirmed

## 2021-10-02 ENCOUNTER — Other Ambulatory Visit: Payer: Self-pay | Admitting: Nurse Practitioner

## 2021-10-02 ENCOUNTER — Telehealth: Payer: Self-pay

## 2021-10-02 ENCOUNTER — Telehealth: Payer: Self-pay | Admitting: Internal Medicine

## 2021-10-02 ENCOUNTER — Emergency Department (HOSPITAL_COMMUNITY): Payer: Medicare Other

## 2021-10-02 ENCOUNTER — Inpatient Hospital Stay (HOSPITAL_COMMUNITY)
Admission: EM | Admit: 2021-10-02 | Discharge: 2021-10-08 | DRG: 948 | Disposition: A | Discharge status: Home / Self Care | Payer: Medicare Other | Attending: Internal Medicine | Admitting: Internal Medicine

## 2021-10-02 ENCOUNTER — Other Ambulatory Visit: Payer: Self-pay

## 2021-10-02 ENCOUNTER — Encounter (HOSPITAL_COMMUNITY): Payer: Self-pay

## 2021-10-02 ENCOUNTER — Other Ambulatory Visit (HOSPITAL_COMMUNITY): Payer: Self-pay

## 2021-10-02 DIAGNOSIS — Z902 Acquired absence of lung [part of]: Secondary | ICD-10-CM

## 2021-10-02 DIAGNOSIS — Z87891 Personal history of nicotine dependence: Secondary | ICD-10-CM

## 2021-10-02 DIAGNOSIS — I10 Essential (primary) hypertension: Secondary | ICD-10-CM | POA: Diagnosis present

## 2021-10-02 DIAGNOSIS — C7A1 Malignant poorly differentiated neuroendocrine tumors: Secondary | ICD-10-CM | POA: Diagnosis present

## 2021-10-02 DIAGNOSIS — T2103XA Burn of unspecified degree of upper back, initial encounter: Secondary | ICD-10-CM | POA: Diagnosis present

## 2021-10-02 DIAGNOSIS — F3160 Bipolar disorder, current episode mixed, unspecified: Secondary | ICD-10-CM | POA: Diagnosis present

## 2021-10-02 DIAGNOSIS — Z6825 Body mass index (BMI) 25.0-25.9, adult: Secondary | ICD-10-CM

## 2021-10-02 DIAGNOSIS — R52 Pain, unspecified: Secondary | ICD-10-CM | POA: Diagnosis not present

## 2021-10-02 DIAGNOSIS — G893 Neoplasm related pain (acute) (chronic): Principal | ICD-10-CM | POA: Diagnosis present

## 2021-10-02 DIAGNOSIS — Z981 Arthrodesis status: Secondary | ICD-10-CM

## 2021-10-02 DIAGNOSIS — I480 Paroxysmal atrial fibrillation: Secondary | ICD-10-CM | POA: Diagnosis not present

## 2021-10-02 DIAGNOSIS — D72819 Decreased white blood cell count, unspecified: Secondary | ICD-10-CM

## 2021-10-02 DIAGNOSIS — I252 Old myocardial infarction: Secondary | ICD-10-CM

## 2021-10-02 DIAGNOSIS — F419 Anxiety disorder, unspecified: Secondary | ICD-10-CM | POA: Diagnosis present

## 2021-10-02 DIAGNOSIS — E860 Dehydration: Secondary | ICD-10-CM | POA: Diagnosis present

## 2021-10-02 DIAGNOSIS — Z8249 Family history of ischemic heart disease and other diseases of the circulatory system: Secondary | ICD-10-CM

## 2021-10-02 DIAGNOSIS — Z888 Allergy status to other drugs, medicaments and biological substances status: Secondary | ICD-10-CM

## 2021-10-02 DIAGNOSIS — Z951 Presence of aortocoronary bypass graft: Secondary | ICD-10-CM

## 2021-10-02 DIAGNOSIS — I2510 Atherosclerotic heart disease of native coronary artery without angina pectoris: Secondary | ICD-10-CM | POA: Diagnosis present

## 2021-10-02 DIAGNOSIS — Z79899 Other long term (current) drug therapy: Secondary | ICD-10-CM

## 2021-10-02 DIAGNOSIS — Z8673 Personal history of transient ischemic attack (TIA), and cerebral infarction without residual deficits: Secondary | ICD-10-CM

## 2021-10-02 DIAGNOSIS — Z801 Family history of malignant neoplasm of trachea, bronchus and lung: Secondary | ICD-10-CM

## 2021-10-02 DIAGNOSIS — Z7982 Long term (current) use of aspirin: Secondary | ICD-10-CM

## 2021-10-02 DIAGNOSIS — H538 Other visual disturbances: Secondary | ICD-10-CM

## 2021-10-02 DIAGNOSIS — Z9221 Personal history of antineoplastic chemotherapy: Secondary | ICD-10-CM

## 2021-10-02 DIAGNOSIS — Z20822 Contact with and (suspected) exposure to covid-19: Secondary | ICD-10-CM | POA: Diagnosis present

## 2021-10-02 DIAGNOSIS — K219 Gastro-esophageal reflux disease without esophagitis: Secondary | ICD-10-CM | POA: Diagnosis present

## 2021-10-02 DIAGNOSIS — Z7901 Long term (current) use of anticoagulants: Secondary | ICD-10-CM

## 2021-10-02 DIAGNOSIS — E871 Hypo-osmolality and hyponatremia: Secondary | ICD-10-CM | POA: Diagnosis present

## 2021-10-02 DIAGNOSIS — M62838 Other muscle spasm: Secondary | ICD-10-CM | POA: Diagnosis present

## 2021-10-02 DIAGNOSIS — S21209A Unspecified open wound of unspecified back wall of thorax without penetration into thoracic cavity, initial encounter: Secondary | ICD-10-CM | POA: Diagnosis present

## 2021-10-02 DIAGNOSIS — Z955 Presence of coronary angioplasty implant and graft: Secondary | ICD-10-CM

## 2021-10-02 DIAGNOSIS — Y842 Radiological procedure and radiotherapy as the cause of abnormal reaction of the patient, or of later complication, without mention of misadventure at the time of the procedure: Secondary | ICD-10-CM | POA: Diagnosis present

## 2021-10-02 DIAGNOSIS — Z823 Family history of stroke: Secondary | ICD-10-CM

## 2021-10-02 DIAGNOSIS — K5900 Constipation, unspecified: Secondary | ICD-10-CM | POA: Diagnosis present

## 2021-10-02 DIAGNOSIS — E440 Moderate protein-calorie malnutrition: Secondary | ICD-10-CM | POA: Diagnosis present

## 2021-10-02 DIAGNOSIS — Z79891 Long term (current) use of opiate analgesic: Secondary | ICD-10-CM

## 2021-10-02 DIAGNOSIS — C3490 Malignant neoplasm of unspecified part of unspecified bronchus or lung: Secondary | ICD-10-CM

## 2021-10-02 LAB — URINALYSIS, ROUTINE W REFLEX MICROSCOPIC
Bilirubin Urine: NEGATIVE
Glucose, UA: NEGATIVE mg/dL
Hgb urine dipstick: NEGATIVE
Ketones, ur: NEGATIVE mg/dL
Leukocytes,Ua: NEGATIVE
Nitrite: NEGATIVE
Protein, ur: NEGATIVE mg/dL
Specific Gravity, Urine: 1.015 (ref 1.005–1.030)
pH: 8.5 — ABNORMAL HIGH (ref 5.0–8.0)

## 2021-10-02 LAB — CBC WITH DIFFERENTIAL/PLATELET
Abs Immature Granulocytes: 0.01 10*3/uL (ref 0.00–0.07)
Basophils Absolute: 0 10*3/uL (ref 0.0–0.1)
Basophils Relative: 0 %
Eosinophils Absolute: 0 10*3/uL (ref 0.0–0.5)
Eosinophils Relative: 0 %
HCT: 34.1 % — ABNORMAL LOW (ref 39.0–52.0)
Hemoglobin: 11.3 g/dL — ABNORMAL LOW (ref 13.0–17.0)
Immature Granulocytes: 0 %
Lymphocytes Relative: 6 %
Lymphs Abs: 0.3 10*3/uL — ABNORMAL LOW (ref 0.7–4.0)
MCH: 30.1 pg (ref 26.0–34.0)
MCHC: 33.1 g/dL (ref 30.0–36.0)
MCV: 90.9 fL (ref 80.0–100.0)
Monocytes Absolute: 0.1 10*3/uL (ref 0.1–1.0)
Monocytes Relative: 2 %
Neutro Abs: 4.3 10*3/uL (ref 1.7–7.7)
Neutrophils Relative %: 92 %
Platelets: 159 10*3/uL (ref 150–400)
RBC: 3.75 MIL/uL — ABNORMAL LOW (ref 4.22–5.81)
RDW: 14.5 % (ref 11.5–15.5)
WBC: 4.7 10*3/uL (ref 4.0–10.5)
nRBC: 0 % (ref 0.0–0.2)

## 2021-10-02 LAB — COMPREHENSIVE METABOLIC PANEL
ALT: 18 U/L (ref 0–44)
AST: 22 U/L (ref 15–41)
Albumin: 3.8 g/dL (ref 3.5–5.0)
Alkaline Phosphatase: 72 U/L (ref 38–126)
Anion gap: 7 (ref 5–15)
BUN: 20 mg/dL (ref 8–23)
CO2: 27 mmol/L (ref 22–32)
Calcium: 9.8 mg/dL (ref 8.9–10.3)
Chloride: 99 mmol/L (ref 98–111)
Creatinine, Ser: 0.96 mg/dL (ref 0.61–1.24)
GFR, Estimated: >60 mL/min (ref >60)
Glucose, Bld: 94 mg/dL (ref 70–99)
Potassium: 3.6 mmol/L (ref 3.5–5.1)
Sodium: 133 mmol/L — ABNORMAL LOW (ref 135–145)
Total Bilirubin: 0.8 mg/dL (ref 0.3–1.2)
Total Protein: 7.1 g/dL (ref 6.5–8.1)

## 2021-10-02 LAB — LIPASE, BLOOD: Lipase: 20 U/L (ref 11–51)

## 2021-10-02 MED ORDER — ONDANSETRON HCL 4 MG/2ML IJ SOLN
4.0000 mg | Freq: Once | INTRAMUSCULAR | Status: AC
  Administered 2021-10-02: 4 mg via INTRAVENOUS
  Filled 2021-10-02: qty 2

## 2021-10-02 MED ORDER — POLYETHYLENE GLYCOL 3350 17 G PO PACK: 17.0000 g | PACK | Freq: Every day | ORAL | Status: DC

## 2021-10-02 MED ORDER — ASPIRIN EC 81 MG PO TBEC
81.0000 mg | DELAYED_RELEASE_TABLET | Freq: Every day | ORAL | Status: DC
  Administered 2021-10-03 – 2021-10-08 (×6): 81 mg via ORAL
  Filled 2021-10-02 (×6): qty 1

## 2021-10-02 MED ORDER — RANOLAZINE ER 500 MG PO TB12
1000.0000 mg | ORAL_TABLET | Freq: Two times a day (BID) | ORAL | Status: DC
  Administered 2021-10-03 – 2021-10-08 (×12): 1000 mg via ORAL
  Filled 2021-10-02 (×13): qty 2

## 2021-10-02 MED ORDER — TIZANIDINE HCL 4 MG PO TABS: 2.0000 mg | ORAL_TABLET | Freq: Every evening | ORAL | Status: DC | PRN

## 2021-10-02 MED ORDER — ONDANSETRON HCL 4 MG PO TABS: 4.0000 mg | ORAL_TABLET | Freq: Four times a day (QID) | ORAL | Status: DC | PRN

## 2021-10-02 MED ORDER — HYDROMORPHONE HCL 1 MG/ML IJ SOLN
1.0000 mg | Freq: Once | INTRAMUSCULAR | Status: AC
  Administered 2021-10-02: 1 mg via INTRAVENOUS
  Filled 2021-10-02: qty 1

## 2021-10-02 MED ORDER — SODIUM CHLORIDE 0.9% FLUSH: 9.0000 mL | INTRAVENOUS | Status: DC | PRN

## 2021-10-02 MED ORDER — HYDROMORPHONE 1 MG/ML IV SOLN: INTRAVENOUS | Status: DC

## 2021-10-02 MED ORDER — FOLIC ACID 1 MG PO TABS
1.0000 mg | ORAL_TABLET | Freq: Every day | ORAL | Status: DC
  Administered 2021-10-03 – 2021-10-08 (×6): 1 mg via ORAL
  Filled 2021-10-02 (×6): qty 1

## 2021-10-02 MED ORDER — DULOXETINE HCL 30 MG PO CPEP
30.0000 mg | ORAL_CAPSULE | Freq: Every evening | ORAL | Status: DC
  Administered 2021-10-02 – 2021-10-05 (×4): 30 mg via ORAL
  Filled 2021-10-02 (×4): qty 1

## 2021-10-02 MED ORDER — GABAPENTIN 100 MG PO CAPS: 100.0000 mg | ORAL_CAPSULE | Freq: Three times a day (TID) | ORAL | Status: DC

## 2021-10-02 MED ORDER — ONDANSETRON HCL 4 MG/2ML IJ SOLN
4.0000 mg | Freq: Four times a day (QID) | INTRAMUSCULAR | Status: DC | PRN
  Administered 2021-10-03 – 2021-10-07 (×5): 4 mg via INTRAVENOUS
  Filled 2021-10-02 (×5): qty 2

## 2021-10-02 MED ORDER — PROCHLORPERAZINE MALEATE 10 MG PO TABS
10.0000 mg | ORAL_TABLET | Freq: Four times a day (QID) | ORAL | Status: DC | PRN
  Administered 2021-10-03: 10 mg via ORAL
  Filled 2021-10-02: qty 1

## 2021-10-02 MED ORDER — RIVAROXABAN 20 MG PO TABS
20.0000 mg | ORAL_TABLET | Freq: Every evening | ORAL | Status: DC
  Administered 2021-10-02 – 2021-10-08 (×7): 20 mg via ORAL
  Filled 2021-10-02 (×7): qty 1

## 2021-10-02 MED ORDER — TIZANIDINE HCL 2 MG PO TABS
2.0000 mg | ORAL_TABLET | Freq: Every evening | ORAL | 0 refills | Status: DC | PRN
  Filled 2021-10-02: qty 30, 30d supply, fill #0

## 2021-10-02 MED ORDER — METOPROLOL SUCCINATE ER 25 MG PO TB24
25.0000 mg | ORAL_TABLET | Freq: Every evening | ORAL | Status: DC
  Administered 2021-10-02 – 2021-10-08 (×5): 25 mg via ORAL
  Filled 2021-10-02 (×6): qty 1

## 2021-10-02 MED ORDER — GABAPENTIN 100 MG PO CAPS
100.0000 mg | ORAL_CAPSULE | Freq: Two times a day (BID) | ORAL | Status: DC
  Administered 2021-10-03 – 2021-10-06 (×8): 100 mg via ORAL
  Filled 2021-10-02 (×8): qty 1

## 2021-10-02 MED ORDER — DIPHENHYDRAMINE HCL 12.5 MG/5ML PO ELIX: 12.5000 mg | ORAL_SOLUTION | Freq: Four times a day (QID) | ORAL | Status: DC | PRN

## 2021-10-02 MED ORDER — SODIUM CHLORIDE 0.9 % IV SOLN
12.5000 mg | Freq: Once | INTRAVENOUS | Status: AC
  Administered 2021-10-02: 12.5 mg via INTRAVENOUS
  Filled 2021-10-02: qty 12.5

## 2021-10-02 MED ORDER — POLYETHYLENE GLYCOL 3350 17 G PO PACK: 17.0000 g | PACK | Freq: Every day | ORAL | Status: DC | PRN

## 2021-10-02 MED ORDER — ADULT MULTIVITAMIN W/MINERALS CH
1.0000 | ORAL_TABLET | Freq: Every day | ORAL | Status: DC
  Administered 2021-10-03 – 2021-10-08 (×6): 1 via ORAL
  Filled 2021-10-02 (×6): qty 1

## 2021-10-02 MED ORDER — ONDANSETRON HCL 4 MG/2ML IJ SOLN
4.0000 mg | Freq: Four times a day (QID) | INTRAMUSCULAR | Status: DC | PRN
  Filled 2021-10-02: qty 2

## 2021-10-02 MED ORDER — DIPHENHYDRAMINE HCL 50 MG/ML IJ SOLN: 12.5000 mg | Freq: Four times a day (QID) | INTRAMUSCULAR | Status: DC | PRN

## 2021-10-02 MED ORDER — NALOXONE HCL 0.4 MG/ML IJ SOLN: 0.4000 mg | INTRAMUSCULAR | Status: DC | PRN

## 2021-10-02 MED ORDER — HYDROMORPHONE HCL 1 MG/ML IJ SOLN
1.0000 mg | INTRAMUSCULAR | Status: DC | PRN
  Administered 2021-10-02 – 2021-10-03 (×4): 1 mg via INTRAVENOUS
  Filled 2021-10-02 (×5): qty 1

## 2021-10-02 MED ORDER — GABAPENTIN 300 MG PO CAPS
300.0000 mg | ORAL_CAPSULE | Freq: Every day | ORAL | Status: DC
  Administered 2021-10-02 – 2021-10-05 (×4): 300 mg via ORAL
  Filled 2021-10-02 (×4): qty 1

## 2021-10-02 MED ORDER — MORPHINE SULFATE ER 15 MG PO TBCR
60.0000 mg | EXTENDED_RELEASE_TABLET | Freq: Three times a day (TID) | ORAL | Status: DC
  Administered 2021-10-03 – 2021-10-08 (×18): 60 mg via ORAL
  Filled 2021-10-02 (×18): qty 4

## 2021-10-02 MED ORDER — DOCUSATE SODIUM 100 MG PO CAPS: 100.0000 mg | ORAL_CAPSULE | Freq: Every day | ORAL | Status: DC

## 2021-10-02 MED ORDER — IOHEXOL 350 MG/ML SOLN
100.0000 mL | Freq: Once | INTRAVENOUS | Status: AC | PRN
  Administered 2021-10-02: 100 mL via INTRAVENOUS

## 2021-10-02 MED ORDER — ACETAMINOPHEN 500 MG PO TABS
1000.0000 mg | ORAL_TABLET | Freq: Four times a day (QID) | ORAL | Status: DC | PRN
  Administered 2021-10-03 – 2021-10-08 (×6): 1000 mg via ORAL
  Filled 2021-10-02 (×6): qty 2

## 2021-10-02 NOTE — Telephone Encounter (Signed)
I spoke with [name] on the phone to follow up with his pain medication prescriptions and to make sure he was able to pick up his Zanaflex. He stated that his wife has been able to get these prescriptions. He said he is still in a considerable amount of pain but hasn't been taking the Zanaflex, so I advised for him to take one tablet at bedtime to see if that helps with his pain. Understanding verbalized. He asked what to do if the pain continued to worsen and I advised him to go to the ED if his pain became uncontrollable over the weekend. Understanding verbalized. All questions answered.

## 2021-10-02 NOTE — Assessment & Plan Note (Addendum)
-  Continue Toprol-XL.  -Continue with Xarelto.

## 2021-10-02 NOTE — Telephone Encounter (Signed)
Scheduled per 2/2 los, pt has been called and confirmed  °

## 2021-10-02 NOTE — Telephone Encounter (Signed)
Pt called stating he is worried he is having a "reaction" to his tx from Tuesday 09/29/21. Pt c/o nausea and increased 8/10 right side flank pain and wants to know if this is because if his tx.   I have discussed this with Dr. [name] who does not feel his increased flank pain is because of his tx and for the nausea, he can take his Zofran and Compazine. I verified with the pt that he took Zofran at 6am but has not taken his Compazine. We have reviewed how to take his nausea rx's.  With regard to his pain, I have spoken with [name], NP in palliative who is managing his pain medication and she advised that pt was to pick up his pain rx's today and was supposed to have picked up his muscle relaxer. Pt states he wife has gone to puck up his pain medications and he states he was not aware that he was supposed to take a muscle relaxer. Pt expressed understanding of this information. Pt also has an appt on Monday here in [facility].

## 2021-10-02 NOTE — Assessment & Plan Note (Addendum)
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 Just started immunotherapy. -Patient's primary oncologist and neuro oncologist informed of admission. -Outpatient follow-up with oncology/neurooncology. -Palliative care following.

## 2021-10-02 NOTE — ED Provider Notes (Signed)
[name] DEPT Provider Note   CSN: [id number] Arrival date & time: 10/02/21  1853     History  Chief Complaint  Patient presents with   Abdominal Pain   Back Pain    [name] is a 66 y.o. male.  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 He was admitted on 08/29/2021 and 09/15/21 from home with uncontrolled intractable chest wall pain. He is on oral dilaudid, ms contin, and Zanaflex for his pain.  He has used these meds, but he continues to have pain and nausea.  EMS gave him fentanyl and zofran en route, but it did not help.  Pt did have immunotherapy on 1/31.  He called Dr. [name] (oncology) who recommended coming to the ED if pain was uncontrollable at home.      Home Medications Prior to Admission medications   Medication Sig Start Date End Date Taking? Authorizing Provider  acetaminophen (TYLENOL) 500 MG tablet Take 1,000 mg by mouth every 6 (six) hours as needed for mild pain, fever or headache.   Yes [provider]  aspirin EC 81 MG tablet Take 81 mg by mouth daily. Swallow whole.   Yes [provider]  docusate sodium (COLACE) 100 MG capsule Take 100-200 mg by mouth daily.   Yes [provider]  DULoxetine (CYMBALTA) 30 MG capsule Take 30 mg by mouth daily. 10/01/21  Yes [provider]  ferrous gluconate (FERGON) 324 MG tablet Take 324 mg by mouth daily with breakfast.   Yes [provider]  folic acid (FOLVITE) 1 MG tablet Take 1 tablet (1 mg total) by mouth daily. 09/17/21  Yes Curt Bears, MD  gabapentin (NEURONTIN) 100 MG capsule Take  1 capsule by mouth twice a day and 3 capsules at bedtime. Patient taking differently: 100-300 mg See admin instructions. Take 100 mg by mouth two times a day and 300 mg at bedtime 10/01/21  Yes Pickenpack-Cousar, Athena N, NP  HYDROmorphone (DILAUDID) 4 MG tablet Take 1 tablet (4 mg total) by mouth every 3 (three) hours as needed for severe pain. 10/01/21  Yes Pickenpack-Cousar, Carlena Sax, NP  lidocaine (LIDODERM) 5 % Place 1 patch onto the skin daily as needed (for pain- Remove & Discard patch within 12 hours or as directed by MD).   Yes [provider]  lidocaine-prilocaine (EMLA) cream Apply to the Port-A-Cath site 30-60-minute before treatment Patient taking differently: Apply 1 application topically See admin instructions. Apply to the Port-A-Cath site 30-60-minutes before treatment 09/29/21  Yes Curt Bears, MD  metoprolol succinate (TOPROL-XL) 25 MG 24 hr tablet Take 1 tablet (25 mg total) by mouth daily. Patient taking differently: Take 25 mg by mouth every evening. 08/27/20  Yes [name], MD  midodrine (PROAMATINE) 2.5 MG tablet Take 2.5 mg by mouth 3 (three) times daily as needed (AS DIRECTED).   Yes [provider]  morphine (MS CONTIN) 60 MG 12 hr tablet Take 1 tablet (60 mg total) by mouth every 8 (eight) hours. **10/02/2021 10/02/21  Yes Pickenpack-Cousar, Carlena Sax, NP  Multiple Vitamin (MULTIVITAMIN WITH MINERALS) TABS tablet Take 1 tablet by mouth daily.   Yes [provider]  ondansetron (ZOFRAN) 8 MG tablet Take 1 tablet (8 mg total) by mouth every 8 (eight) hours as needed for nausea or vomiting. Starting 3 days after chemotherapy 08/27/21  Yes Pickenpack-Cousar, Carlena Sax, NP  prochlorperazine (COMPAZINE) 10 MG tablet Take 1 tablet (10 mg total) by mouth every 6 (six) hours as needed for nausea or vomiting. 09/29/21  Yes Curt Bears, MD  ranolazine (RANEXA) 1000 MG SR tablet Take 1 tablet (1,000 mg total) by mouth 2 (two) times daily. 05/25/21   Yes [name], MD  tiZANidine (ZANAFLEX) 2 MG tablet Take 1 tablet (2 mg total) by mouth at bedtime as needed for muscle spasms. Patient taking differently: Take 2 mg by mouth at bedtime. 10/02/21  Yes Pickenpack-Cousar, Athena N, NP  XARELTO 20 MG TABS tablet Take 20 mg by mouth every evening. 06/01/21  Yes [provider]  DULoxetine 40 MG CPEP Take 40 mg by mouth every evening. Patient not taking: Reported on 10/02/2021 08/01/21   Jadene Pierini E, PA-C  polyethylene glycol (MIRALAX / GLYCOLAX) 17 g packet Take 17 g by mouth daily. Patient not taking: Reported on 10/02/2021 09/04/21   Mariel Aloe, MD      Allergies    Prednisone, Tetanus toxoids, Wellbutrin [bupropion], and Varenicline    Review of Systems   Review of Systems  Cardiovascular:  Positive for chest pain.  Gastrointestinal:  Positive for abdominal pain and nausea.  All other systems reviewed and are negative.  Physical Exam Updated Vital Signs BP 103/64 (BP Location: Left Arm)    Pulse 88    Temp 99 F (37.2 C) (Oral)    Resp 18    Ht 5\' 9"  (1.753 m)    Wt 77.1 kg    SpO2 100%    BMI 25.10 kg/m  Physical Exam Vitals and nursing note reviewed.  Constitutional:      Appearance: He is well-developed.  HENT:     Head: Normocephalic and atraumatic.     Mouth/Throat:     Mouth: Mucous membranes are moist.     Pharynx: Oropharynx is clear.  Eyes:     Extraocular Movements: Extraocular movements intact.     Pupils: Pupils are equal, round, and reactive to light.  Cardiovascular:     Rate and Rhythm: Regular rhythm. Tachycardia present.  Pulmonary:     Effort: Pulmonary effort is normal.     Breath sounds: Normal breath sounds.  Chest:    Abdominal:     General: Abdomen is flat. Bowel sounds are normal.     Palpations: Abdomen is soft.     Tenderness: There is abdominal tenderness in the right upper quadrant.  Skin:    General: Skin is warm.     Capillary Refill: Capillary refill takes less than 2  seconds.  Neurological:     General: No focal deficit present.     Mental Status: He is alert and oriented to person, place, and time.    ED Results / Procedures / Treatments   Labs (all labs ordered are listed, but only abnormal results are displayed) Labs Reviewed  CBC WITH DIFFERENTIAL/PLATELET - Abnormal; Notable for the following components:      Result Value   RBC 3.75 (*)    Hemoglobin 11.3 (*)    HCT 34.1 (*)    Lymphs Abs 0.3 (*)    All other components within normal limits  COMPREHENSIVE METABOLIC PANEL - Abnormal; Notable for the following components:   Sodium 133 (*)  All other components within normal limits  URINALYSIS, ROUTINE W REFLEX MICROSCOPIC - Abnormal; Notable for the following components:   Color, Urine YELLOW (*)    APPearance HAZY (*)    pH 8.5 (*)    Bacteria, UA RARE (*)    All other components within normal limits  CBC - Abnormal; Notable for the following components:   RBC 4.04 (*)    Hemoglobin 12.0 (*)    HCT 36.9 (*)    All other components within normal limits  BASIC METABOLIC PANEL - Abnormal; Notable for the following components:   Sodium 129 (*)    Chloride 96 (*)    All other components within normal limits  RESP PANEL BY RT-PCR (FLU A&B, COVID) [id number]  LIPASE, BLOOD    EKG EKG Interpretation  Date/Time:  Friday October 02 2021 19:38:24 EST Ventricular Rate:  81 PR Interval:  138 QRS Duration: 96 QT Interval:  368 QTC Calculation: 428 R Axis:   73 Text Interpretation: Sinus rhythm Probable left atrial enlargement Low voltage, extremity and precordial leads Partial missing lead(s): V2 No significant change since last tracing Confirmed by Isla Pence [phone]) on 10/02/2021 7:48:37 PM  Radiology CT Angio Chest PE W and/or Wo Contrast  Result Date: 10/02/2021 CLINICAL DATA:  Lung cancer patient with bone metastasis. Generalized abdominal pain and low back pain status post commencement of immunotherapy. EXAM: CT ANGIOGRAPHY  CHEST CT ABDOMEN AND PELVIS WITH CONTRAST TECHNIQUE: Multidetector CT imaging of the chest was performed using the standard protocol during bolus administration of intravenous contrast. Multiplanar CT image reconstructions and MIPs were obtained to evaluate the vascular anatomy. Multidetector CT imaging of the abdomen and pelvis was performed using the standard protocol during bolus administration of intravenous contrast. RADIATION DOSE REDUCTION: This exam was performed according to the departmental dose-optimization program which includes automated exposure control, adjustment of the mA and/or kV according to patient size and/or use of iterative reconstruction technique. CONTRAST:  128mL OMNIPAQUE IOHEXOL 350 MG/ML SOLN COMPARISON:  Chest CT angiogram dated 09/15/2021. CT abdomen and pelvis dated 09/15/2021. FINDINGS: CTA CHEST FINDINGS Cardiovascular: There is no pulmonary embolism identified within the main, lobar or central segmental pulmonary arteries bilaterally. Some of the most peripheral pulmonary artery branches to each lung are difficult to definitively characterize due to mild patient breathing motion artifact. No pericardial effusion. Aortic atherosclerosis. No evidence of aortic dissection. Mediastinum/Nodes: No mass or enlarged lymph nodes are identified within the mediastinum or perihilar regions. Esophagus is unremarkable. Trachea is unremarkable. Surgical changes at the RIGHT hilum, compatible with a previous surgical procedure/resection. Lungs/Pleura: Bilateral pulmonary nodules are not significantly changed in the short-term interval, compared to most recent chest CT of 09/15/2021. No new nodules seen. Stable small RIGHT pleural effusion, with overlying mild atelectasis. No pneumothorax. Musculoskeletal: Again noted is destructive change within the posterior eleventh rib. Again noted are multiple soft tissue masses within the soft tissues of the lateral and posterior RIGHT chest wall, not  significantly changed in appearance compared to the earlier study of 09/15/2021. Review of the MIP images confirms the above findings. CT ABDOMEN and PELVIS FINDINGS Hepatobiliary: No acute or suspicious findings within the liver. Tiny hypodensities again noted within the liver, too small to definitively characterize but most likely small cysts. Gallbladder is unremarkable. No bile duct dilatation seen. Pancreas: Unremarkable. No pancreatic ductal dilatation or surrounding inflammatory changes. Spleen: Normal in size without focal abnormality. Adrenals/Urinary Tract: Adrenal glands appear normal. LEFT renal cyst. Kidneys otherwise unremarkable without suspicious mass,  stone or hydronephrosis. Bladder is decompressed. Stomach/Bowel: No dilated large or small bowel loops. No evidence of bowel wall inflammation. Appendix is normal. Stomach is unremarkable, partially decompressed. Vascular/Lymphatic: Aortic atherosclerosis. No acute-appearing vascular abnormality. No enlarged lymph nodes are seen in the abdomen or pelvis. Reproductive: Prostate is unremarkable. Other: No free fluid or abscess collection is seen within the abdomen or pelvis. No free intraperitoneal air. Musculoskeletal: No acute or suspicious osseous abnormality within the abdomen or pelvis. Review of the MIP images confirms the above findings. IMPRESSION: 1. No new findings within the chest, abdomen or pelvis. No pulmonary embolism. No evidence of pneumonia or pulmonary edema. 2. Stable small RIGHT pleural effusion, with overlying mild atelectasis. 3. Bilateral pulmonary nodules and soft tissue masses within the soft tissues of the RIGHT chest wall, as described above, not significantly changed in the short-term interval compared to the earlier study of 09/15/2021. 4. Stable destructive change within the posterior eleventh rib. 5. No acute findings within the abdomen or pelvis. No evidence of metastatic disease within the abdomen or pelvis. Aortic  Atherosclerosis (ICD10-I70.0). Electronically Signed   By: Franki Cabot M.D.   On: 10/02/2021 21:27   CT ABDOMEN PELVIS W CONTRAST  Result Date: 10/02/2021 CLINICAL DATA:  Lung cancer patient with bone metastasis. Generalized abdominal pain and low back pain status post commencement of immunotherapy. EXAM: CT ANGIOGRAPHY CHEST CT ABDOMEN AND PELVIS WITH CONTRAST TECHNIQUE: Multidetector CT imaging of the chest was performed using the standard protocol during bolus administration of intravenous contrast. Multiplanar CT image reconstructions and MIPs were obtained to evaluate the vascular anatomy. Multidetector CT imaging of the abdomen and pelvis was performed using the standard protocol during bolus administration of intravenous contrast. RADIATION DOSE REDUCTION: This exam was performed according to the departmental dose-optimization program which includes automated exposure control, adjustment of the mA and/or kV according to patient size and/or use of iterative reconstruction technique. CONTRAST:  177mL OMNIPAQUE IOHEXOL 350 MG/ML SOLN COMPARISON:  Chest CT angiogram dated 09/15/2021. CT abdomen and pelvis dated 09/15/2021. FINDINGS: CTA CHEST FINDINGS Cardiovascular: There is no pulmonary embolism identified within the main, lobar or central segmental pulmonary arteries bilaterally. Some of the most peripheral pulmonary artery branches to each lung are difficult to definitively characterize due to mild patient breathing motion artifact. No pericardial effusion. Aortic atherosclerosis. No evidence of aortic dissection. Mediastinum/Nodes: No mass or enlarged lymph nodes are identified within the mediastinum or perihilar regions. Esophagus is unremarkable. Trachea is unremarkable. Surgical changes at the RIGHT hilum, compatible with a previous surgical procedure/resection. Lungs/Pleura: Bilateral pulmonary nodules are not significantly changed in the short-term interval, compared to most recent chest CT of  09/15/2021. No new nodules seen. Stable small RIGHT pleural effusion, with overlying mild atelectasis. No pneumothorax. Musculoskeletal: Again noted is destructive change within the posterior eleventh rib. Again noted are multiple soft tissue masses within the soft tissues of the lateral and posterior RIGHT chest wall, not significantly changed in appearance compared to the earlier study of 09/15/2021. Review of the MIP images confirms the above findings. CT ABDOMEN and PELVIS FINDINGS Hepatobiliary: No acute or suspicious findings within the liver. Tiny hypodensities again noted within the liver, too small to definitively characterize but most likely small cysts. Gallbladder is unremarkable. No bile duct dilatation seen. Pancreas: Unremarkable. No pancreatic ductal dilatation or surrounding inflammatory changes. Spleen: Normal in size without focal abnormality. Adrenals/Urinary Tract: Adrenal glands appear normal. LEFT renal cyst. Kidneys otherwise unremarkable without suspicious mass, stone or hydronephrosis. Bladder is decompressed.  Stomach/Bowel: No dilated large or small bowel loops. No evidence of bowel wall inflammation. Appendix is normal. Stomach is unremarkable, partially decompressed. Vascular/Lymphatic: Aortic atherosclerosis. No acute-appearing vascular abnormality. No enlarged lymph nodes are seen in the abdomen or pelvis. Reproductive: Prostate is unremarkable. Other: No free fluid or abscess collection is seen within the abdomen or pelvis. No free intraperitoneal air. Musculoskeletal: No acute or suspicious osseous abnormality within the abdomen or pelvis. Review of the MIP images confirms the above findings. IMPRESSION: 1. No new findings within the chest, abdomen or pelvis. No pulmonary embolism. No evidence of pneumonia or pulmonary edema. 2. Stable small RIGHT pleural effusion, with overlying mild atelectasis. 3. Bilateral pulmonary nodules and soft tissue masses within the soft tissues of the  RIGHT chest wall, as described above, not significantly changed in the short-term interval compared to the earlier study of 09/15/2021. 4. Stable destructive change within the posterior eleventh rib. 5. No acute findings within the abdomen or pelvis. No evidence of metastatic disease within the abdomen or pelvis. Aortic Atherosclerosis (ICD10-I70.0). Electronically Signed   By: Franki Cabot M.D.   On: 10/02/2021 21:27   DG Chest Portable 1 View  Result Date: 10/02/2021 CLINICAL DATA:  Metastatic lung cancer being treated with immunotherapy. Generalized abdominal pain and back pain. EXAM: PORTABLE CHEST 1 VIEW COMPARISON:  09/16/2021 FINDINGS: Previous median sternotomy and CABG. Power port in place on the right with the tip at the SVC RA junction. Previous lobectomy on the right. The lungs are presently clear. No bone lesion visible on the frontal chest radiography. Known right eleventh rib lesion not visible using this technique. Aortic atherosclerotic calcification incidentally noted. IMPRESSION: Previous lobectomy on the right.  No active disease. Electronically Signed   By: Nelson Chimes M.D.   On: 10/02/2021 19:49    Procedures Procedures    Medications Ordered in ED Medications  morphine (MS CONTIN) 12 hr tablet 60 mg (60 mg Oral Given 10/03/21 1358)  multivitamin with minerals tablet 1 tablet (1 tablet Oral Given 10/03/21 0939)  metoprolol succinate (TOPROL-XL) 24 hr tablet 25 mg (25 mg Oral Given 11/04/16 2993)  folic acid (FOLVITE) tablet 1 mg (1 mg Oral Given 10/03/21 0939)  DULoxetine (CYMBALTA) DR capsule 30 mg (30 mg Oral Given 10/02/21 2352)  aspirin EC tablet 81 mg (has no administration in time range)  acetaminophen (TYLENOL) tablet 1,000 mg (has no administration in time range)  rivaroxaban (XARELTO) tablet 20 mg (20 mg Oral Given 10/02/21 2352)  tiZANidine (ZANAFLEX) tablet 2 mg (has no administration in time range)  ranolazine (RANEXA) 12 hr tablet 1,000 mg (1,000 mg Oral Given 10/03/21  0939)  prochlorperazine (COMPAZINE) tablet 10 mg (10 mg Oral Given 10/03/21 0624)  ondansetron (ZOFRAN) tablet 4 mg ( Oral See Alternative 10/03/21 0916)    Or  ondansetron (ZOFRAN) injection 4 mg (4 mg Intravenous Given 10/03/21 0916)  gabapentin (NEURONTIN) capsule 100 mg (100 mg Oral Given 10/03/21 0939)    And  gabapentin (NEURONTIN) capsule 300 mg (300 mg Oral Given 10/02/21 2352)  senna-docusate (Senokot-S) tablet 1 tablet (1 tablet Oral Given 10/03/21 0939)  polyethylene glycol (MIRALAX / GLYCOLAX) packet 17 g (17 g Oral Patient Refused/Not Given 10/03/21 0939)  HYDROmorphone (DILAUDID) injection 1 mg (1 mg Intravenous Given 10/03/21 1310)  alum & mag hydroxide-simeth (MAALOX/MYLANTA) 200-200-20 MG/5ML suspension 30 mL (0 mLs Oral Hold 10/03/21 1127)    And  lidocaine (XYLOCAINE) 2 % viscous mouth solution 15 mL (15 mLs Oral Given 10/03/21 1130)  alum & mag hydroxide-simeth (MAALOX/MYLANTA) 200-200-20 MG/5ML suspension 30 mL (has no administration in time range)    And  lidocaine (XYLOCAINE) 2 % viscous mouth solution 15 mL (has no administration in time range)  pantoprazole (PROTONIX) EC tablet 40 mg (40 mg Oral Given 10/03/21 1306)  HYDROmorphone (DILAUDID) injection 1 mg (1 mg Intravenous Given 10/02/21 1921)  ondansetron (ZOFRAN) injection 4 mg (4 mg Intravenous Given 10/02/21 1923)  HYDROmorphone (DILAUDID) injection 1 mg (1 mg Intravenous Given 10/02/21 2044)  iohexol (OMNIPAQUE) 350 MG/ML injection 100 mL (100 mLs Intravenous Contrast Given 10/02/21 2109)  promethazine (PHENERGAN) 12.5 mg in sodium chloride 0.9 % 50 mL IVPB (0 mg Intravenous Stopped 10/02/21 2245)  HYDROmorphone (DILAUDID) injection 1 mg (1 mg Intravenous Given 10/02/21 2245)  sodium chloride 0.9 % bolus 1,000 mL (1,000 mLs Intravenous New Bag/Given 10/03/21 1306)    ED Course/ Medical Decision Making/ A&P                           Medical Decision Making Amount and/or Complexity of Data Reviewed Labs: ordered. Radiology:  ordered.  Risk Prescription drug management. Decision regarding hospitalization.   Pt was given multiple doses of narcotics for pain.  He was given multiple doses of nausea meds for nausea.  Pt is still in quite a bit of pain.  CT scans ordered of the chest and abdomen/pelvis which I reviewed.  He has nothing acute, but he has several masses and a pathological destruction of the 11th right rib.  This is where his pain is the greatest.  Labs were ordered and reviewed.  Nothing acute.  Due to intractable pain despite IV narcotics, pt was d/w Dr. Alcario Drought ([facility]) for admission.        Final Clinical Impression(s) / ED Diagnoses Final diagnoses:  Intractable pain  Non-small cell lung cancer with metastasis [facility])    Rx / DC Orders ED Discharge Orders     None         Isla Pence, MD 10/03/21 1515

## 2021-10-02 NOTE — H&P (Signed)
History and Physical    Patient: [name] DOB: [birth year] DOA: 10/02/2021 DOS: the patient was seen and examined on 10/02/2021 PCP: [name], MD  Patient coming from: Home  Chief Complaint:  Chief Complaint  Patient presents with   Abdominal Pain   Back Pain    HPI: [name] is a 66 y.o. 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  Recent progression of disease -> just started immunotherapy a couple of days ago.  Pt now presents to ED with c/o uncontrolled abdominal and back pain.  Pain had been controlled on PO meds up until the last couple of days following an admit for uncontrolled pain in Jan.  CT AP = no acute findings.  Pain better after IV dilaudid in ED, EDP wants admit for pain control.    Review of Systems: As mentioned in the history of present illness. All other systems reviewed and are negative. Past Medical History:  Diagnosis Date   Anemia    Anxiety    Arthritis    Basal cell carcinoma (BCC) of forehead    CAD (coronary artery disease)    a. 10/2015 ant STEMI >> LHC with 3 v CAD; oLAD tx with POBA >> emergent CABG. b. Multiple evals since that time, early graft failure of SVG-RCA by cath 03/2016. c. 2/19 PCI/DES x1 to pRCA, normal EF.   Carotid artery disease ([place])    a. 40-59% BICA 02/2018.   Depression    Dyspnea    Ectopic atrial tachycardia (HCC)    Esophageal reflux    eosinophil esophagitis   Family history of adverse reaction to anesthesia    "sister has PONV" (06/21/2017)   Former tobacco use    Gout    Hepatitis C    "treated and cured" (06/21/2017)   High cholesterol    History of blood transfusion    History of kidney stones    Hypertension    Ischemic cardiomyopathy    a. EF 25-30% at intraop TEE 4/17  //  b. Limited Echo 5/17 - EF 45-50%, mild ant HK. c. EF 55-65% by cath 09/2017.   Migraine    "3-4/yr" (06/21/2017)    Myocardial infarction ([place]) 10/2015   Palpitations    Pneumonia    Sinus bradycardia    a. HR dropping into 40s in 02/2016 -> BB reduced.   Stroke [facility]) 10/2016   "small one; sometimes my memory/cognitive issues" (06/21/2017)   Symptomatic hypotension    a. 02/2016 ER visit -> meds reduced.   Syncope    Wears dentures    Wears glasses    Past Surgical History:  Procedure Laterality Date   25 GAUGE PARS PLANA VITRECTOMY WITH 20 GAUGE MVR PORT  12/31/2020   Procedure: 25 GAUGE PARS PLANA VITRECTOMY WITH 20 GAUGE MVR PORT;  Surgeon: [name], MD;  Location: [place];  Service: Thoracic;;   ANTERIOR CERVICAL DECOMP/DISCECTOMY FUSION N/A 10/17/2018   Procedure: Anterior Cervical Decompression Fusion - Cervical seven -Thoracic one;  Surgeon: Consuella Lose, MD;  Location: [place];  Service: Neurosurgery;  Laterality: N/A;   BASAL CELL CARCINOMA EXCISION     "forehead   BIOPSY  07/20/2019   Procedure: BIOPSY;  Surgeon: Carol Ada, MD;  Location: [facility];  Service: Endoscopy;;   BIOPSY OF MEDIASTINAL MASS Right 07/29/2021   Procedure: RIGHT CHEST WALL MASS BIOPSY;  Surgeon: [name], MD;  Location:  [place] OR;  Service: Thoracic;  Laterality: Right;   BRONCHIAL BIOPSY  12/24/2020   Procedure: BRONCHIAL BIOPSIES;  Surgeon: Garner Nash, DO;  Location: [place] [facility];  Service: Pulmonary;;   BRONCHIAL BRUSHINGS  12/24/2020   Procedure: BRONCHIAL BRUSHINGS;  Surgeon: Garner Nash, DO;  Location: [place] [facility];  Service: Pulmonary;;   BRONCHIAL NEEDLE ASPIRATION BIOPSY  12/24/2020   Procedure: BRONCHIAL NEEDLE ASPIRATION BIOPSIES;  Surgeon: Garner Nash, DO;  Location: [place];  Service: Pulmonary;;   CARDIAC CATHETERIZATION N/A 11/28/2015   Procedure: Left Heart Cath and Coronary Angiography;  Surgeon: [name], MD;  Location: [place] [facility];  Service: Cardiovascular;  Laterality: N/A;   CARDIAC CATHETERIZATION N/A 11/28/2015   Procedure:  Coronary Balloon Angioplasty;  Surgeon: [name], MD;  Location: [place] [facility];  Service: Cardiovascular;  Laterality: N/A;  ostial LAD   CARDIAC CATHETERIZATION N/A 11/28/2015   Procedure: Coronary/Graft Angiography;  Surgeon: [name], MD;  Location: [place] [facility];  Service: Cardiovascular;  Laterality: N/A;  coronaries only    CARDIAC CATHETERIZATION N/A 04/21/2016   Procedure: Left Heart Cath and Coronary Angiography;  Surgeon: Wellington Hampshire, MD;  Location: [place] [facility];  Service: Cardiovascular;  Laterality: N/A;   CARDIAC CATHETERIZATION N/A 06/14/2016   Procedure: Left Heart Cath and Cors/Grafts Angiography;  Surgeon: Lorretta Harp, MD;  Location: [place] [facility];  Service: Cardiovascular;  Laterality: N/A;   CARDIAC CATHETERIZATION N/A 09/08/2016   Procedure: Left Heart Cath and Cors/Grafts Angiography;  Surgeon: Wellington Hampshire, MD;  Location: [place] [facility];  Service: Cardiovascular;  Laterality: N/A;   CARDIAC CATHETERIZATION     CORONARY ARTERY BYPASS GRAFT N/A 11/28/2015   Procedure: CORONARY ARTERY BYPASS GRAFTING (CABG) TIMES FIVE USING LEFT INTERNAL MAMMARY ARTERY AND RIGHT GREATER SAPHENOUS,VIEN HARVEATED BY ENDOVIEN, INTRAOPPRATIVE TEE;  Surgeon: Gaye Pollack, MD;  Location: [place];  Service: Open Heart Surgery;  Laterality: N/A;   CORONARY STENT INTERVENTION N/A 10/05/2017   Procedure: CORONARY STENT INTERVENTION;  Surgeon: [name], MD;  Location: [place] [facility];  Service: Cardiovascular;  Laterality: N/A;   ESOPHAGOGASTRODUODENOSCOPY (EGD) WITH PROPOFOL N/A 07/20/2019   Procedure: ESOPHAGOGASTRODUODENOSCOPY (EGD) WITH PROPOFOL;  Surgeon: Carol Ada, MD;  [facility];  Service: Endoscopy;  Laterality: N/A;   ESOPHAGOGASTRODUODENOSCOPY (EGD) WITH PROPOFOL N/A 01/30/2021   Procedure: ESOPHAGOGASTRODUODENOSCOPY (EGD) WITH PROPOFOL;  Surgeon: Carol Ada, MD;  [facility];  Service: Endoscopy;   Laterality: N/A;   FIDUCIAL MARKER PLACEMENT  12/24/2020   Procedure: FIDUCIAL DYE MARKER PLACEMENT;  Surgeon: Garner Nash, DO;  Location: [place] [facility];  Service: Pulmonary;;   HUMERUS SURGERY Right 1969   "tumor inside bone; filled it w/bone chips"   INTERCOSTAL NERVE BLOCK Right 12/24/2020   Procedure: INTERCOSTAL NERVE BLOCK;  Surgeon: [name], MD;  Location: [place];  Service: Thoracic;  Laterality: Right;   IR IMAGING GUIDED PORT INSERTION  02/06/2021   LEFT HEART CATH AND CORS/GRAFTS ANGIOGRAPHY N/A 03/11/2017   Procedure: Left Heart Cath and Cors/Grafts Angiography;  Surgeon: Leonie Man, MD;  Location: [place] [facility];  Service: Cardiovascular;  Laterality: N/A;   LEFT HEART CATH AND CORS/GRAFTS ANGIOGRAPHY N/A 10/05/2017   Procedure: LEFT HEART CATH AND CORS/GRAFTS ANGIOGRAPHY;  Surgeon: [name], MD;  Location: [place] [facility];  Service: Cardiovascular;  Laterality: N/A;   LEFT HEART CATH AND CORS/GRAFTS ANGIOGRAPHY N/A 04/11/2019   Procedure: LEFT HEART  CATH AND CORS/GRAFTS ANGIOGRAPHY;  Surgeon: [name], MD;  Location: [place] [facility];  Service: Cardiovascular;  Laterality: N/A;   NODE DISSECTION Right 12/24/2020   Procedure: NODE DISSECTION;  Surgeon: [name], MD;  Location: [place];  Service: Thoracic;  Laterality: Right;   PERIPHERAL VASCULAR CATHETERIZATION N/A 06/14/2016   Procedure: Lower Extremity Angiography;  Surgeon: Lorretta Harp, MD;  Location: [place] [facility];  Service: Cardiovascular;  Laterality: N/A;   VIDEO BRONCHOSCOPY WITH ENDOBRONCHIAL NAVIGATION Right 12/24/2020   Procedure: VIDEO BRONCHOSCOPY WITH ENDOBRONCHIAL NAVIGATION;  Surgeon: Garner Nash, DO;  Location: [place];  Service: Pulmonary;  Laterality: Right;   VIDEO BRONCHOSCOPY WITH ENDOBRONCHIAL ULTRASOUND N/A 12/24/2020   Procedure: VIDEO BRONCHOSCOPY WITH ENDOBRONCHIAL ULTRASOUND;  Surgeon: Garner Nash, DO;  Location: [place];   Service: Pulmonary;  Laterality: N/A;   VIDEO BRONCHOSCOPY WITH INSERTION OF INTERBRONCHIAL VALVE (IBV) N/A 12/31/2020   Procedure: VIDEO BRONCHOSCOPY WITH INSERTION OF INTERBRONCHIAL VALVE (IBV).VALVE IN CARTRIDGE 79mm,9mm. CHEST TUBE PLACEMENT.;  Surgeon: [name], MD;  [facility];  Service: Thoracic;  Laterality: N/A;   VIDEO BRONCHOSCOPY WITH INSERTION OF INTERBRONCHIAL VALVE (IBV) N/A 07/29/2021   Procedure: VIDEO BRONCHOSCOPY WITH REMOVAL OF INTERBRONCHIAL VALVE (IBV);  Surgeon: [name], MD;  Location: [facility];  Service: Thoracic;  Laterality: N/A;   Social History:  reports that he quit smoking about 5 years ago. His smoking use included cigarettes. He has a 33.00 pack-year smoking history. He has never used smokeless tobacco. He reports that he does not currently use alcohol. He reports that he does not use drugs.  Allergies  Allergen Reactions   Prednisone Other (See Comments)    States that this med makes him "crazy"   Tetanus Toxoids Swelling and Other (See Comments)    Fever, Swelling of the arm    Wellbutrin [Bupropion] Other (See Comments)    Crazy thoughts, nightmares   Varenicline Other (See Comments)    Unpleasant dreams    Family History  Problem Relation Age of Onset   Lung cancer Mother    Heart Problems Father    Heart attack Father 32   Stroke Father    Heart failure Father    Heart attack Maternal Grandmother    Stroke Maternal Grandmother    Heart attack Paternal Uncle    Hypertension Brother    Autoimmune disease Neg Hx     Prior to Admission medications   Medication Sig Start Date End Date Taking? Authorizing Provider  DULoxetine (CYMBALTA) 30 MG capsule Take 30 mg by mouth daily. 10/01/21  Yes [provider]  acetaminophen (TYLENOL) 500 MG tablet Take 1,000 mg by mouth every 6 (six) hours as needed for mild pain, fever or headache.    [provider]  aspirin EC 81 MG tablet Take 81 mg by mouth daily. Swallow  whole.    [provider]  docusate sodium (COLACE) 100 MG capsule Take 100-200 mg by mouth daily.    [provider]  DULoxetine 40 MG CPEP Take 40 mg by mouth every evening. Patient not taking: Reported on 10/02/2021 08/01/21   John Giovanni, PA-C  folic acid (FOLVITE) 1 MG tablet Take 1 tablet (1 mg total) by mouth daily. 09/17/21   Curt Bears, MD  gabapentin (NEURONTIN) 100 MG capsule Take 1 capsule by mouth twice a day and 3 capsules at bedtime. 10/01/21   Pickenpack-Cousar, Carlena Sax, NP  HYDROmorphone (DILAUDID) 4 MG tablet Take  1 tablet (4 mg total) by mouth every 3 (three) hours as needed for severe pain. 10/01/21   Pickenpack-Cousar, Carlena Sax, NP  lidocaine-prilocaine (EMLA) cream Apply to the Port-A-Cath site 30-60-minute before treatment 09/29/21   Curt Bears, MD  metoprolol succinate (TOPROL-XL) 25 MG 24 hr tablet Take 1 tablet (25 mg total) by mouth daily. Patient taking differently: Take 25 mg by mouth every evening. 08/27/20   [name], MD  morphine (MS CONTIN) 60 MG 12 hr tablet Take 1 tablet (60 mg total) by mouth every 8 (eight) hours. **10/02/2021 10/02/21   Pickenpack-Cousar, Carlena Sax, NP  Multiple Vitamin (MULTIVITAMIN WITH MINERALS) TABS tablet Take 1 tablet by mouth daily.    [provider]  ondansetron (ZOFRAN) 8 MG tablet Take 1 tablet (8 mg total) by mouth every 8 (eight) hours as needed for nausea or vomiting. Starting 3 days after chemotherapy 08/27/21   Pickenpack-Cousar, Carlena Sax, NP  polyethylene glycol (MIRALAX / GLYCOLAX) 17 g packet Take 17 g by mouth daily. 09/04/21   Mariel Aloe, MD  prochlorperazine (COMPAZINE) 10 MG tablet Take 1 tablet (10 mg total) by mouth every 6 (six) hours as needed for nausea or vomiting. 09/29/21   Curt Bears, MD  ranolazine (RANEXA) 1000 MG SR tablet Take 1 tablet (1,000 mg total) by mouth 2 (two) times daily. 05/25/21   [name], MD  tiZANidine (ZANAFLEX) 2 MG tablet Take 1  tablet (2 mg total) by mouth at bedtime as needed for muscle spasms. 10/02/21   Pickenpack-Cousar, Carlena Sax, NP  XARELTO 20 MG TABS tablet Take 20 mg by mouth every evening. 06/01/21   [provider]    Physical Exam: Vitals:   10/02/21 1906 10/02/21 1909 10/02/21 2030 10/02/21 2145  BP:  124/78 137/76 131/71  Pulse:  (!) 101 89 82  Resp:  17 (!) 24 16  Temp:  98 F (36.7 C)    SpO2:  98% 95% 100%  Weight: 77.1 kg     Height: 5\' 9"  (1.753 m)      Constitutional: NAD, calm, comfortable Eyes: PERRL, lids and conjunctivae normal ENMT: Mucous membranes are moist. Posterior pharynx clear of any exudate or lesions.Normal dentition.  Neck: normal, supple, no masses, no thyromegaly Respiratory: clear to auscultation bilaterally, no wheezing, no crackles. Normal respiratory effort. No accessory muscle use.  Cardiovascular: Regular rate and rhythm, no murmurs / rubs / gallops. No extremity edema. 2+ pedal pulses. No carotid bruits.  Abdomen: no tenderness, no masses palpated. No hepatosplenomegaly. Bowel sounds positive.  Musculoskeletal: no clubbing / cyanosis. No joint deformity upper and lower extremities. Good ROM, no contractures. Normal muscle tone.  Skin: no rashes, lesions, ulcers. No induration Neurologic: CN 2-12 grossly intact. Sensation intact, DTR normal. Strength 5/5 in all 4.  Psychiatric: Normal judgment and insight. Alert and oriented x 3. Normal mood.    Data Reviewed:  There are no new results to review at this time.  Assessment and Plan: * Cancer-related breakthrough pain- (present on admission) Pt with uncontrolled pain from metastatic cancer despite home pain regimen since starting immunotherapy a couple of days ago. CT shows no acute intra-abdominal process. Suspect cancer related pain. 1) continue home MS contin 2) hold home dilaudid PO 3) use dilaudid PCA for PRN breakthrough pain; will put on PRN dilaudid in ED until he can get a bed and get PCA  started. 4) Continue home neurontin and flexeril 5) Consult Pal care in AM  AF (paroxysmal atrial  fibrillation) ([place])- (present on admission) Continue Xarelto, BB  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[place])- (present on admission) Lung primary with metastatic disease widespread including brain mets recently treated with rad therapy. Just started immunotherapy.       Advance Care Planning:   Code Status: Partial Code   Consults: None  Family Communication: No family in room  Severity of Illness: The appropriate patient status for this patient is OBSERVATION. Observation status is judged to be reasonable and necessary in order to provide the required intensity of service to ensure the patient's safety. The patient's presenting symptoms, physical exam findings, and initial radiographic and laboratory data in the context of their medical condition is felt to place them at decreased risk for further clinical deterioration. Furthermore, it is anticipated that the patient will be medically stable for discharge from the hospital within 2 midnights of admission.   Author: [name]., DO 10/02/2021 10:37 PM  For on call review [email or web address].

## 2021-10-02 NOTE — Assessment & Plan Note (Addendum)
Pt presented with uncontrolled pain from metastatic cancer despite home pain regimen since starting immunotherapy a couple of days ago. -CT shows no acute intra-abdominal process. -Suspect cancer related pain. - Continue MS contin TID - Dilaudid dose of 4-6 mg every 4 hours as needed as per palliative care on 2/07. -Continue home neurontin and flexeril, Cymbalta. -Pain better controlled this afternoon.

## 2021-10-02 NOTE — ED Triage Notes (Signed)
Patient brought in via ems from home. Patient is lung ca patient with bone mets. Started immunotherapy 2 days ago. Since then, c/o general abd pain and low back pain

## 2021-10-03 ENCOUNTER — Encounter (HOSPITAL_COMMUNITY): Payer: Self-pay | Admitting: Internal Medicine

## 2021-10-03 ENCOUNTER — Other Ambulatory Visit: Payer: Self-pay

## 2021-10-03 DIAGNOSIS — E440 Moderate protein-calorie malnutrition: Secondary | ICD-10-CM | POA: Diagnosis present

## 2021-10-03 DIAGNOSIS — Z6825 Body mass index (BMI) 25.0-25.9, adult: Secondary | ICD-10-CM | POA: Diagnosis not present

## 2021-10-03 DIAGNOSIS — E871 Hypo-osmolality and hyponatremia: Secondary | ICD-10-CM | POA: Diagnosis present

## 2021-10-03 DIAGNOSIS — F419 Anxiety disorder, unspecified: Secondary | ICD-10-CM

## 2021-10-03 DIAGNOSIS — E860 Dehydration: Secondary | ICD-10-CM | POA: Diagnosis present

## 2021-10-03 DIAGNOSIS — Z87891 Personal history of nicotine dependence: Secondary | ICD-10-CM | POA: Diagnosis not present

## 2021-10-03 DIAGNOSIS — Z951 Presence of aortocoronary bypass graft: Secondary | ICD-10-CM | POA: Diagnosis not present

## 2021-10-03 DIAGNOSIS — I480 Paroxysmal atrial fibrillation: Secondary | ICD-10-CM | POA: Diagnosis present

## 2021-10-03 DIAGNOSIS — T2103XA Burn of unspecified degree of upper back, initial encounter: Secondary | ICD-10-CM | POA: Diagnosis present

## 2021-10-03 DIAGNOSIS — Z20822 Contact with and (suspected) exposure to covid-19: Secondary | ICD-10-CM | POA: Diagnosis present

## 2021-10-03 DIAGNOSIS — I10 Essential (primary) hypertension: Secondary | ICD-10-CM | POA: Diagnosis present

## 2021-10-03 DIAGNOSIS — Z79899 Other long term (current) drug therapy: Secondary | ICD-10-CM | POA: Diagnosis not present

## 2021-10-03 DIAGNOSIS — Z8673 Personal history of transient ischemic attack (TIA), and cerebral infarction without residual deficits: Secondary | ICD-10-CM | POA: Diagnosis not present

## 2021-10-03 DIAGNOSIS — Z981 Arthrodesis status: Secondary | ICD-10-CM | POA: Diagnosis not present

## 2021-10-03 DIAGNOSIS — G893 Neoplasm related pain (acute) (chronic): Secondary | ICD-10-CM | POA: Diagnosis present

## 2021-10-03 DIAGNOSIS — F3160 Bipolar disorder, current episode mixed, unspecified: Secondary | ICD-10-CM | POA: Diagnosis present

## 2021-10-03 DIAGNOSIS — I252 Old myocardial infarction: Secondary | ICD-10-CM | POA: Diagnosis not present

## 2021-10-03 DIAGNOSIS — R52 Pain, unspecified: Secondary | ICD-10-CM | POA: Diagnosis present

## 2021-10-03 DIAGNOSIS — K219 Gastro-esophageal reflux disease without esophagitis: Secondary | ICD-10-CM

## 2021-10-03 DIAGNOSIS — Y842 Radiological procedure and radiotherapy as the cause of abnormal reaction of the patient, or of later complication, without mention of misadventure at the time of the procedure: Secondary | ICD-10-CM | POA: Diagnosis present

## 2021-10-03 DIAGNOSIS — C7A1 Malignant poorly differentiated neuroendocrine tumors: Secondary | ICD-10-CM | POA: Diagnosis present

## 2021-10-03 DIAGNOSIS — Z955 Presence of coronary angioplasty implant and graft: Secondary | ICD-10-CM | POA: Diagnosis not present

## 2021-10-03 DIAGNOSIS — K5900 Constipation, unspecified: Secondary | ICD-10-CM | POA: Diagnosis present

## 2021-10-03 DIAGNOSIS — D72819 Decreased white blood cell count, unspecified: Secondary | ICD-10-CM | POA: Diagnosis present

## 2021-10-03 DIAGNOSIS — Z9221 Personal history of antineoplastic chemotherapy: Secondary | ICD-10-CM | POA: Diagnosis not present

## 2021-10-03 DIAGNOSIS — I2510 Atherosclerotic heart disease of native coronary artery without angina pectoris: Secondary | ICD-10-CM | POA: Diagnosis present

## 2021-10-03 LAB — URINALYSIS, ROUTINE W REFLEX MICROSCOPIC
Bacteria, UA: NONE SEEN
Bilirubin Urine: NEGATIVE
Glucose, UA: NEGATIVE mg/dL
Hgb urine dipstick: NEGATIVE
Ketones, ur: NEGATIVE mg/dL
Leukocytes,Ua: NEGATIVE
Nitrite: NEGATIVE
Protein, ur: 30 mg/dL — AB
Specific Gravity, Urine: 1.02 (ref 1.005–1.030)
pH: 7 (ref 5.0–8.0)

## 2021-10-03 LAB — CBC
HCT: 36.9 % — ABNORMAL LOW (ref 39.0–52.0)
Hemoglobin: 12 g/dL — ABNORMAL LOW (ref 13.0–17.0)
MCH: 29.7 pg (ref 26.0–34.0)
MCHC: 32.5 g/dL (ref 30.0–36.0)
MCV: 91.3 fL (ref 80.0–100.0)
Platelets: 154 10*3/uL (ref 150–400)
RBC: 4.04 MIL/uL — ABNORMAL LOW (ref 4.22–5.81)
RDW: 14.7 % (ref 11.5–15.5)
WBC: 4.7 10*3/uL (ref 4.0–10.5)
nRBC: 0 % (ref 0.0–0.2)

## 2021-10-03 LAB — RESP PANEL BY RT-PCR (FLU A&B, COVID) ARPGX2
Influenza A by PCR: NEGATIVE
Influenza B by PCR: NEGATIVE
SARS Coronavirus 2 by RT PCR: NEGATIVE

## 2021-10-03 LAB — BASIC METABOLIC PANEL
Anion gap: 7 (ref 5–15)
BUN: 19 mg/dL (ref 8–23)
CO2: 26 mmol/L (ref 22–32)
Calcium: 9.6 mg/dL (ref 8.9–10.3)
Chloride: 96 mmol/L — ABNORMAL LOW (ref 98–111)
Creatinine, Ser: 1.05 mg/dL (ref 0.61–1.24)
GFR, Estimated: >60 mL/min (ref >60)
Glucose, Bld: 98 mg/dL (ref 70–99)
Potassium: 3.6 mmol/L (ref 3.5–5.1)
Sodium: 129 mmol/L — ABNORMAL LOW (ref 135–145)

## 2021-10-03 MED ORDER — ALUM & MAG HYDROXIDE-SIMETH 200-200-20 MG/5ML PO SUSP: 30.0000 mL | Freq: Once | ORAL | Status: DC

## 2021-10-03 MED ORDER — SODIUM CHLORIDE 0.9 % IV BOLUS
1000.0000 mL | Freq: Once | INTRAVENOUS | Status: AC
  Administered 2021-10-03: 1000 mL via INTRAVENOUS

## 2021-10-03 MED ORDER — LIDOCAINE VISCOUS HCL 2 % MT SOLN
15.0000 mL | Freq: Once | OROMUCOSAL | Status: AC
  Administered 2021-10-03: 15 mL via ORAL
  Filled 2021-10-03: qty 15

## 2021-10-03 MED ORDER — POLYETHYLENE GLYCOL 3350 17 G PO PACK
17.0000 g | PACK | Freq: Every day | ORAL | Status: DC
  Administered 2021-10-04 – 2021-10-05 (×2): 17 g via ORAL
  Filled 2021-10-03 (×6): qty 1

## 2021-10-03 MED ORDER — FERROUS GLUCONATE 324 (38 FE) MG PO TABS
324.0000 mg | ORAL_TABLET | Freq: Every day | ORAL | Status: DC
  Administered 2021-10-04 – 2021-10-08 (×5): 324 mg via ORAL
  Filled 2021-10-03 (×5): qty 1

## 2021-10-03 MED ORDER — ENSURE ENLIVE PO LIQD
237.0000 mL | Freq: Two times a day (BID) | ORAL | Status: DC
  Administered 2021-10-03 – 2021-10-07 (×5): 237 mL via ORAL

## 2021-10-03 MED ORDER — HYDROMORPHONE HCL 1 MG/ML IJ SOLN
1.0000 mg | INTRAMUSCULAR | Status: DC | PRN
Start: 2021-10-03 — End: 2021-10-04
  Administered 2021-10-03 – 2021-10-04 (×9): 1 mg via INTRAVENOUS
  Filled 2021-10-03 (×9): qty 1

## 2021-10-03 MED ORDER — LIDOCAINE VISCOUS HCL 2 % MT SOLN
15.0000 mL | Freq: Four times a day (QID) | OROMUCOSAL | Status: DC | PRN
  Filled 2021-10-03: qty 15

## 2021-10-03 MED ORDER — SENNOSIDES-DOCUSATE SODIUM 8.6-50 MG PO TABS
1.0000 | ORAL_TABLET | Freq: Two times a day (BID) | ORAL | Status: DC
  Administered 2021-10-03 – 2021-10-08 (×11): 1 via ORAL
  Filled 2021-10-03 (×11): qty 1

## 2021-10-03 MED ORDER — ALUM & MAG HYDROXIDE-SIMETH 200-200-20 MG/5ML PO SUSP: 30.0000 mL | Freq: Four times a day (QID) | ORAL | Status: DC | PRN

## 2021-10-03 MED ORDER — PANTOPRAZOLE SODIUM 40 MG PO TBEC
40.0000 mg | DELAYED_RELEASE_TABLET | Freq: Every day | ORAL | Status: DC
  Administered 2021-10-03 – 2021-10-08 (×6): 40 mg via ORAL
  Filled 2021-10-03 (×6): qty 1

## 2021-10-03 MED ORDER — BISACODYL 5 MG PO TBEC
5.0000 mg | DELAYED_RELEASE_TABLET | Freq: Every day | ORAL | Status: DC | PRN
  Administered 2021-10-03 – 2021-10-05 (×3): 5 mg via ORAL
  Filled 2021-10-03 (×4): qty 1

## 2021-10-03 MED ORDER — ALUM & MAG HYDROXIDE-SIMETH 200-200-20 MG/5ML PO SUSP
30.0000 mL | ORAL | Status: DC | PRN
  Administered 2021-10-03: 30 mL via ORAL
  Filled 2021-10-03: qty 30

## 2021-10-03 NOTE — Assessment & Plan Note (Addendum)
-   Continue with PPI.   -GI cocktail as needed.

## 2021-10-03 NOTE — Assessment & Plan Note (Addendum)
-   Secondary to metastatic cancer. -Continue nutritional supplementation.

## 2021-10-03 NOTE — Consult Note (Signed)
[name]: Reason for Consult:Chronic nonhealing lesion on mid-thoracic region of spine Wound type:  neoplastic vs benign lesion Pressure Injury POA: N/A Measurement: 1.5cm round Wound UOR:VIFBP, nonviable tissue Drainage (amount, consistency, odor) scant serous Periwound:mild erythema Dressing procedure/placement/frequency: I have provided Nursing with guidance for topical care using soap and water to cleanse, rinse and pat dry followed by placement of a silicone foam dressing.    Recommend the patient follow up with his oncologist regarding this lesion as neoplasms are common with concomitant chemotherapy. Additionally, patient has had a basal cell carcinoma removed on his forehead in the past (2020).  I communicated my recommendation to Dr. Grandville Silos and he will convey the recommendation to the patient.  [facility] nursing team will not follow, but will remain available to this patient, the nursing and medical teams.  Please re-consult if needed. Thanks, Anthony Flakes, MSN, [name], [name], [name]  Pager# [phone]

## 2021-10-03 NOTE — Hospital Course (Addendum)
Patient is a pleasant 66 year old unfortunate gentleman history of metastatic neuroendocrine lung cancer with mets to the brain treated with radiation, recent progression of disease.  Started on immunotherapy couple of days prior to admission, history of paroxysmal A. fib, CAD presenting to the ED with uncontrolled abdominal and back pain which prior to admission had recently been controlled by oral medications recently adjusted in the outpatient setting by palliative care.  Patient admitted for pain management.  Palliative care consulted for symptom management.

## 2021-10-03 NOTE — Assessment & Plan Note (Signed)
-   Stable. -Continue home regimen Toprol-XL, Xarelto, Ranexa -Outpatient follow-up with cardiology.

## 2021-10-03 NOTE — Assessment & Plan Note (Addendum)
-   Likely secondary to dehydration and uncontrolled pain. -Improved with hydration. -Continue current pain management.

## 2021-10-03 NOTE — Assessment & Plan Note (Addendum)
-   Continue with Cymbalta.

## 2021-10-03 NOTE — Progress Notes (Signed)
Progress Note   Patient: [name] FMB:[id number] DOB: 10/05/1955 DOA: 10/02/2021     0 DOS: the patient was seen and examined on 10/03/2021   Brief hospital course: Patient is a pleasant 66 year old unfortunate gentleman history of metastatic neuroendocrine lung cancer with mets to the brain treated with radiation, recent progression of disease.  Started on immunotherapy couple of days prior to admission, history of paroxysmal A. fib, CAD presenting to the ED with uncontrolled abdominal and back pain which prior to admission had recently been controlled by oral medications recently adjusted in the outpatient setting by palliative care.  Patient admitted for pain management.  Assessment and Plan: * Cancer-related breakthrough pain- (present on admission) Pt with uncontrolled pain from metastatic cancer despite home pain regimen since starting immunotherapy a couple of days ago. CT shows no acute intra-abdominal process. Suspect cancer related pain. 1) continue home regimen MS contin 2) hold home dilaudid PO 3) discontinue order for Dilaudid PCA and placed on Dilaudid 1 mg every 2 hours as needed breakthrough pain. 4) Continue home neurontin and flexeril 5) consult palliative care for symptom management and goals of care.  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[place])- (present on admission) Lung primary with metastatic disease widespread including brain mets recently treated with rad therapy. Just started immunotherapy. -Patient's primary oncologist and neuro oncologist informed of admission via epic. -Outpatient follow-up with oncology/neurooncology.  AF (paroxysmal atrial fibrillation) ([place])- (present on admission) -Continue home regimen Toprol-XL for rate control.   -Xarelto for anticoagulation.   Hyponatremia- (present on admission) - Likely secondary to dehydration and uncontrolled pain. -IV fluids. -Pain management.  Protein-calorie malnutrition, moderate  ([place])- (present on admission) - Secondary to metastatic cancer. -Place on nutritional supplementation.  Gastroesophageal reflux disease- (present on admission) - Place on PPI. -GI cocktail as needed.  S/P CABG x 5 - Stable. -Continue home regimen Toprol-XL, Xarelto, Ranexa -Outpatient follow-up with cardiology.  Mixed bipolar I disorder ([place])- (present on admission) - Stable. -Continue home regimen Cymbalta.  Anxiety- (present on admission) - Cymbalta.        Subjective: Patient sitting up on gurney in the ED.  States pain is a little bit better controlled on his current regimen that he has since presented today ED although not completely controlled at this time.  No chest pain.  No shortness of breath.  States pain more in his right ribs going all the way around to his spine.  Physical Exam: Vitals:   10/03/21 1100 10/03/21 1200 10/03/21 1313 10/03/21 1346  BP: 108/64 94/66 92/73  103/64  Pulse: 85 87 (!) 107 88  Resp: 17 13 17 18   Temp:    99 F (37.2 C)  TempSrc:    Oral  SpO2: 97% 98% 97% 100%  Weight:      Height:       General exam: : NAD Respiratory system: CTA B anterior lung fields.  No wheezes, no rhonchi.  Speaking in full sentences.  Normal respiratory effort.  Right rib wall tender to palpation. Cardiovascular system: Regular rate and rhythm no murmurs rubs or gallops.  No JVD.  No lower extremity edema.  Gastrointestinal system: Abdomen soft, nontender, nondistended, positive bowel sounds.  No rebound.  No guarding. Central nervous system: Alert and oriented. No focal neurological deficits. Extremities: Symmetric 5 x 5 power. Skin: No rashes, lesions or ulcers Psychiatry: Judgement and insight appear normal. Mood & affect appropriate.  Data Reviewed:  I have Reviewed nursing notes, Vitals, and Lab results since pt's last  encounter. Pertinent lab results basic metabolic profile with a sodium of 129. I have independently visualized and interpreted  imaging of CT angiogram chest, CT abdomen and pelvis which showed negative for PE, negative for infiltrate, stable destructive change within the posterior 11th rib, bilateral pulmonary nodules and soft tissue masses within the soft tissues of the right chest wall not significantly changed in the short-term interval compared to earlier study of 09/15/2021.. I have discussed pt's care plan and test results with patient and nursing staff..   Family Communication: Updated patient.  No family at bedside.  Disposition: Status is: Inpatient Remains inpatient appropriate because: Severity of illness.          Planned Discharge Destination: Home with Home Health     Time spent: 45 minutes  Author: Irine Seal, MD 10/03/2021 4:09 PM  For on call review [email or web address].

## 2021-10-03 NOTE — Assessment & Plan Note (Signed)
-   Stable. -Continue home regimen Cymbalta.

## 2021-10-03 NOTE — ED Notes (Signed)
Pt. Raised concern about irritation site to the back. On assessment it was noticed that he had a dime sized white area that was surrounded by erythema with minimal drainage. Pt admits to it being there for a while, but state that it has not gone away and wanted to get it looked at since he was already in the ED. DO notified and at beside.

## 2021-10-04 LAB — CBC
HCT: 34 % — ABNORMAL LOW (ref 39.0–52.0)
Hemoglobin: 10.9 g/dL — ABNORMAL LOW (ref 13.0–17.0)
MCH: 29.8 pg (ref 26.0–34.0)
MCHC: 32.1 g/dL (ref 30.0–36.0)
MCV: 92.9 fL (ref 80.0–100.0)
Platelets: 128 10*3/uL — ABNORMAL LOW (ref 150–400)
RBC: 3.66 MIL/uL — ABNORMAL LOW (ref 4.22–5.81)
RDW: 14.2 % (ref 11.5–15.5)
WBC: 2.9 10*3/uL — ABNORMAL LOW (ref 4.0–10.5)
nRBC: 0 % (ref 0.0–0.2)

## 2021-10-04 LAB — BASIC METABOLIC PANEL
Anion gap: 7 (ref 5–15)
BUN: 24 mg/dL — ABNORMAL HIGH (ref 8–23)
CO2: 26 mmol/L (ref 22–32)
Calcium: 9.2 mg/dL (ref 8.9–10.3)
Chloride: 97 mmol/L — ABNORMAL LOW (ref 98–111)
Creatinine, Ser: 1.05 mg/dL (ref 0.61–1.24)
GFR, Estimated: >60 mL/min (ref >60)
Glucose, Bld: 118 mg/dL — ABNORMAL HIGH (ref 70–99)
Potassium: 4 mmol/L (ref 3.5–5.1)
Sodium: 130 mmol/L — ABNORMAL LOW (ref 135–145)

## 2021-10-04 MED ORDER — HYDROMORPHONE HCL 1 MG/ML IJ SOLN
1.0000 mg | INTRAMUSCULAR | Status: DC | PRN
  Administered 2021-10-04 – 2021-10-05 (×8): 1 mg via INTRAVENOUS
  Filled 2021-10-04 (×8): qty 1

## 2021-10-04 MED ORDER — HYDROMORPHONE HCL 4 MG PO TABS
4.0000 mg | ORAL_TABLET | ORAL | Status: DC | PRN
  Administered 2021-10-04 – 2021-10-05 (×6): 4 mg via ORAL
  Filled 2021-10-04 (×6): qty 1

## 2021-10-04 NOTE — TOC CM/SW Note (Addendum)
°  Transition of Care [facility]) Screening Note   Patient Details  Name: [name] Date of Birth: [birth year]   Transition of Care [facility]) CM/SW Contact:    Ross Ludwig, LCSW Phone Number: 10/04/2021, 6:22 PM    Transition of Care Department [facility]) has reviewed patient and no TOC needs have been identified at this time. We will continue to monitor patient advancement through interdisciplinary progression rounds. If new patient transition needs arise, please place a TOC consult.

## 2021-10-04 NOTE — Evaluation (Signed)
Occupational Therapy Evaluation Patient Details Name: [name] MRN: [id number] DOB: 1955-11-26 Today's Date: 10/04/2021   History of Present Illness Pt is 66 yo male admitted on 09/14/21 with R sided rib pain - chronic, CA related (mets to ribs with fx on R).  Pt with hx including but not limited to lung CA, chronic pain, neuroendocrine tumor, CAD, CABG, HLD, HTN, PE, fibromyalgia, and depression.   Clinical Impression   Patient evaluated by Occupational Therapy with no further acute OT needs identified. All education has been completed and the patient has no further questions. Patient is MI for ADLs. Patient endorsed not needing OT at this time.  See below for any follow-up Occupational Therapy or equipment needs. OT is signing off. Thank you for this referral.       Recommendations for follow up therapy are one component of a multi-disciplinary discharge planning process, led by the attending physician.  Recommendations may be updated based on patient status, additional functional criteria and insurance authorization.   Follow Up Recommendations  No OT follow up    Assistance Recommended at Discharge None  Patient can return home with the following      Functional Status Assessment  Patient has not had a recent decline in their functional status  Equipment Recommendations  None recommended by OT    Recommendations for Other Services       Precautions / Restrictions Precautions Precautions: None Restrictions Weight Bearing Restrictions: No      Mobility Bed Mobility Overal bed mobility: Modified Independent                  Transfers                          Balance Overall balance assessment: No apparent balance deficits (not formally assessed)                                         ADL either performed or assessed with clinical judgement   ADL Overall ADL's : Modified independent                                        General ADL Comments: patient was able to complete shower transfers, LB dressing, and functional mobility in room with MI. patient reported having taken himself to bathroom MI since admission. patient was educated on proper body mechaics to avoid pressure on ribs/back. patient verbalized understanding of education at this time. patient was educated on benefits of reacher. patient reported he would buy one if he needed it.     Vision Patient Visual Report: No change from baseline       Perception     Praxis      Pertinent Vitals/Pain Pain Assessment Pain Assessment: 0-10 Pain Score: 6  Pain Location: R ribs Pain Descriptors / Indicators: Discomfort Pain Intervention(s): Limited activity within patient's tolerance, Monitored during session, Premedicated before session     Hand Dominance Left   Extremity/Trunk Assessment Upper Extremity Assessment Upper Extremity Assessment: Overall WFL for tasks assessed   Lower Extremity Assessment Lower Extremity Assessment: Defer to PT evaluation   Cervical / Trunk Assessment Cervical / Trunk Assessment: Normal   Communication Communication Communication: No difficulties   Cognition Arousal/Alertness: Awake/alert Behavior During Therapy:  WFL for tasks assessed/performed Overall Cognitive Status: Within Functional Limits for tasks assessed                                 General Comments: very pleasant and motivated     General Comments       Exercises     Shoulder Instructions      Home Living Family/patient expects to be discharged to:: Private residence Living Arrangements: Spouse/significant other Available Help at Discharge: Family;Available PRN/intermittently Type of Home: House Home Access: Stairs to enter [employer] of Steps: 6 Entrance Stairs-Rails: Right;Left Home Layout: Two level;Bed/bath upstairs Alternate Level Stairs-Number of Steps: 13 Alternate Level Stairs-Rails:  Left Bathroom Shower/Tub: Occupational psychologist: Standard Bathroom Accessibility: Yes   Home Equipment: None          Prior Functioning/Environment Prior Level of Function : Independent/Modified Independent;Working/employed;Driving                        OT Problem List:        OT Treatment/Interventions:      OT Goals(Current goals can be found in the care plan section) Acute Rehab OT Goals OT Goal Formulation: All assessment and education complete, DC therapy  OT Frequency:      Co-evaluation              AM-PAC OT "6 Clicks" Daily Activity     Outcome Measure Help from another person eating meals?: None Help from another person taking care of personal grooming?: None Help from another person toileting, which includes using toliet, bedpan, or urinal?: None Help from another person bathing (including washing, rinsing, drying)?: None Help from another person to put on and taking off regular upper body clothing?: None Help from another person to put on and taking off regular lower body clothing?: None 6 Click Score: 24   End of Session    Activity Tolerance: Patient tolerated treatment well Patient left: in bed;with call bell/phone within reach  OT Visit Diagnosis: Pain                Time: 1206-1217 OT Time Calculation (min): 11 min Charges:  OT General Charges $OT Visit: 1 Visit OT Evaluation $OT Eval Low Complexity: 1 Low  Leota Sauers, MS Acute Rehabilitation Department Office# [phone] Pager# [phone]   Marcellina Millin 10/04/2021, 12:25 PM

## 2021-10-04 NOTE — Progress Notes (Signed)
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  Started on immunotherapy couple of days prior to admission, history of paroxysmal A. fib, CAD presenting to the ED with uncontrolled abdominal and back pain which prior to admission had recently been controlled by oral medications recently adjusted in the outpatient setting by palliative care.  Patient admitted for pain management.  Assessment and Plan: * Cancer-related breakthrough pain- (present on admission) Pt presented with uncontrolled pain from metastatic cancer despite home pain regimen since starting immunotherapy a couple of days ago. CT shows no acute intra-abdominal process. Suspect cancer related pain. -Patient with some improvement on current regimen. 1) continue home regimen MS contin 2) resume home dilaudid PO 3) discontinude order for Dilaudid PCA and placed on Dilaudid 1 mg every 2 hours as needed breakthrough pain. 4) Continue home neurontin and flexeril 5) palliative care consultation pending for symptom management and goals of care.   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[place])- (present on admission) Lung primary with metastatic disease widespread including brain mets recently treated with rad therapy. Just started immunotherapy. -Patient's primary oncologist and neuro oncologist informed of admission via epic. -Outpatient follow-up with oncology/neurooncology.  AF (paroxysmal atrial fibrillation) ([place])- (present on admission) -Toprol-XL for rate control.   -Xarelto for anticoagulation.  Hyponatremia- (present on admission) - Likely secondary to dehydration and uncontrolled pain. -IV  fluids. -Continue current pain management.  Protein-calorie malnutrition, moderate ([place])- (present on admission) - Secondary to metastatic cancer. -Continue nutritional supplementation.  Gastroesophageal reflux disease- (present on admission) - PPI.   -GI cocktail as needed.  S/P CABG x 5 - Stable. -Continue home regimen Toprol-XL, Xarelto, Ranexa -Outpatient follow-up with cardiology.  Mixed bipolar I disorder ([place])- (present on admission) - Stable. -Continue home regimen Cymbalta.  Anxiety- (present on admission) - Cymbalta.        Subjective: Patient noted ambulating hallway with physical therapy.  States some pain on ambulation.  No chest pain.  No shortness of breath.  No abdominal pain.  Pain is slowly being better controlled.  Patient need for pain medication interval increasing.  Max of 100 at 1617 hrs.(10/03/2021).  Afebrile currently.  Physical Exam: Vitals:   10/03/21 2000 10/04/21 0133 10/04/21 0445 10/04/21 1458  BP: (!) 99/59 116/74 112/69 114/74  Pulse: 87 94 92 (!) 106  Resp: 18 18 20 16   Temp: 98.5 F (36.9 C) 99 F (37.2 C) 98.1 F (36.7 C) 99.1 F (37.3 C)  TempSrc: Oral Oral Oral Oral  SpO2: 100% 100% 96% 99%  Weight:      Height:       General exam: : NAD. Respiratory system: Lungs clear to auscultation bilaterally.  No wheezes, no crackles, no rhonchi.  Normal respiratory effort.  Speaking in full sentences.  Right rib wall with some tenderness to palpation.  Cardiovascular system: RRR no murmurs rubs or gallops.  No JVD.  No lower extremity edema.  Gastrointestinal system: Abdomen soft, nontender, nondistended, positive bowel sounds.  No rebound.  No guarding.   Central nervous system: Alert and oriented. No focal neurological deficits. Extremities: Symmetric 5 x 5 power. Skin: No rashes, lesions or ulcers Psychiatry: Judgement and insight appear normal. Mood & affect appropriate.  Data Reviewed:  I have Reviewed nursing notes, Vitals,  and Lab results since pt's last encounter. Pertinent lab results basic metabolic profile with a sodium of 129. I have independently visualized and interpreted imaging of CT angiogram chest, CT abdomen and pelvis which showed negative for PE, negative for infiltrate, stable destructive change within the posterior 11th rib, bilateral pulmonary nodules and soft tissue masses within the soft tissues of the right chest wall not significantly changed in the short-term interval compared to earlier study of 09/15/2021.. I have discussed pt's care plan and test results with patient and nursing staff..   Family Communication: Updated patient.  No family at bedside.  Disposition: Status is: Inpatient Remains inpatient appropriate because: Severity of illness.          Planned Discharge Destination: Home with Home Health     Time spent: 40 minutes  Author: Irine Seal, MD 10/04/2021 3:10 PM  For on call review [email or web address].

## 2021-10-04 NOTE — Evaluation (Signed)
Physical Therapy Evaluation Patient Details Name: [name] MRN: [id number] DOB: 01/12/1956 Today's Date: 10/04/2021  History of Present Illness  Pt is 66 yo male admitted on 09/14/21 with R sided rib pain - chronic, CA related (mets to ribs with fx on R).  Pt with hx including but not limited to lung CA, chronic pain, neuroendocrine tumor, CAD, CABG, HLD, HTN, PE, fibromyalgia, and depression.  Clinical Impression  Pt admitted as above and able to demonstrate IND in all tasks but with noted mild balance deficits - pt back stepping and side stepping without difficulty but with increased difficulty with stork standing and tandem stepping.  Pt also with 2 minor stumbles with walking but with good recovery.  Pt with good awareness and realizes balance is "a little off" and questions if related to pain meds.  Pt with no PT needs at this time and PT will sign off.     Recommendations for follow up therapy are one component of a multi-disciplinary discharge planning process, led by the attending physician.  Recommendations may be updated based on patient status, additional functional criteria and insurance authorization.  Follow Up Recommendations No PT follow up    Assistance Recommended at Discharge None  Patient can return home with the following       Equipment Recommendations None recommended by PT  Recommendations for Other Services       Functional Status Assessment Patient has not had a recent decline in their functional status     Precautions / Restrictions Precautions Precautions: None Restrictions Weight Bearing Restrictions: No      Mobility  Bed Mobility Overal bed mobility: Modified Independent             General bed mobility comments: No physical assist    Transfers Overall transfer level: Modified independent                 General transfer comment: Had supervision but demonstrated safely    Ambulation/Gait Ambulation/Gait assistance:  Independent Gait Distance (Feet): 800 Feet Assistive device: None Gait Pattern/deviations: WFL(Within Functional Limits) Gait velocity: normal     General Gait Details: Supervision with therapy but demonstrating safely and independent level  Stairs Stairs: Yes Stairs assistance: Supervision Stair Management: No rails, One rail Right, Alternating pattern Number of Stairs: 4 General stair comments: 4 stairs up sans rail and 4 steps down with rail  Wheelchair Mobility    Modified Rankin (Stroke Patients Only)       Balance Overall balance assessment: Independent   Sitting balance-Leahy Scale: Normal       Standing balance-Leahy Scale: Good                               Pertinent Vitals/Pain Pain Assessment Pain Assessment: 0-10 Pain Score: 6  Pain Location: R ribs Pain Descriptors / Indicators: Discomfort Pain Intervention(s): Limited activity within patient's tolerance, Monitored during session, Premedicated before session    Home Living Family/patient expects to be discharged to:: Private residence Living Arrangements: Spouse/significant other Available Help at Discharge: Family;Available PRN/intermittently Type of Home: House Home Access: Stairs to enter Entrance Stairs-Rails: Psychiatric nurse of Steps: 6 Alternate Level Stairs-Number of Steps: 13 Home Layout: Two level;Bed/bath upstairs Home Equipment: None      Prior Function Prior Level of Function : Independent/Modified Independent;Working/employed;Driving                     Hand  Dominance   Dominant Hand: Left    Extremity/Trunk Assessment   Upper Extremity Assessment Upper Extremity Assessment: Overall WFL for tasks assessed    Lower Extremity Assessment Lower Extremity Assessment: Overall WFL for tasks assessed    Cervical / Trunk Assessment Cervical / Trunk Assessment: Normal  Communication   Communication: No difficulties  Cognition  Arousal/Alertness: Awake/alert Behavior During Therapy: WFL for tasks assessed/performed Overall Cognitive Status: Within Functional Limits for tasks assessed                                 General Comments: very pleasant and motivated        General Comments      Exercises     Assessment/Plan    PT Assessment Patient does not need any further PT services  PT Problem List         PT Treatment Interventions      PT Goals (Current goals can be found in the Care Plan section)  Acute Rehab PT Goals Patient Stated Goal: decrease pain to maintain activity PT Goal Formulation: All assessment and education complete, DC therapy    Frequency       Co-evaluation               AM-PAC PT "6 Clicks" Mobility  Outcome Measure Help needed turning from your back to your side while in a flat bed without using bedrails?: None Help needed moving from lying on your back to sitting on the side of a flat bed without using bedrails?: None Help needed moving to and from a bed to a chair (including a wheelchair)?: None Help needed standing up from a chair using your arms (e.g., wheelchair or bedside chair)?: None Help needed to walk in hospital room?: None Help needed climbing 3-5 steps with a railing? : None 6 Click Score: 24    End of Session   Activity Tolerance: Patient tolerated treatment well Patient left: Other (comment) (sitting EOB with Dr in room) Nurse Communication: Mobility status PT Visit Diagnosis: Other abnormalities of gait and mobility (R26.89)    Time: 4665-9935 PT Time Calculation (min) (ACUTE ONLY): 18 min   Charges:   PT Evaluation $PT Eval Low Complexity: 1 Low          [place] Acute Rehabilitation Services Pager [phone] Office [phone]    Kyrra Prada 10/04/2021, 10:14 AM

## 2021-10-05 ENCOUNTER — Telehealth: Payer: Self-pay | Admitting: *Deleted

## 2021-10-05 ENCOUNTER — Other Ambulatory Visit: Payer: Self-pay | Admitting: Radiation Therapy

## 2021-10-05 ENCOUNTER — Ambulatory Visit: Payer: Medicare Other | Admitting: Physician Assistant

## 2021-10-05 ENCOUNTER — Other Ambulatory Visit: Payer: Medicare Other

## 2021-10-05 DIAGNOSIS — Z515 Encounter for palliative care: Secondary | ICD-10-CM

## 2021-10-05 LAB — URINE CULTURE

## 2021-10-05 MED ORDER — SODIUM CHLORIDE 0.9 % IV SOLN: INTRAVENOUS | Status: DC

## 2021-10-05 MED ORDER — HYDROMORPHONE HCL 1 MG/ML IJ SOLN
1.0000 mg | INTRAMUSCULAR | Status: DC | PRN
  Administered 2021-10-05 – 2021-10-06 (×5): 1 mg via INTRAVENOUS
  Filled 2021-10-05 (×5): qty 1

## 2021-10-05 MED ORDER — HYDROMORPHONE HCL 4 MG PO TABS
4.0000 mg | ORAL_TABLET | ORAL | Status: DC | PRN
  Administered 2021-10-05 – 2021-10-06 (×5): 6 mg via ORAL
  Filled 2021-10-05 (×5): qty 2

## 2021-10-05 NOTE — Progress Notes (Signed)
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  Started on immunotherapy couple of days prior to admission, history of paroxysmal A. fib, CAD presenting to the ED with uncontrolled abdominal and back pain which prior to admission had recently been controlled by oral medications recently adjusted in the outpatient setting by palliative care.  Patient admitted for pain management.  Palliative care consulted for symptom management.  Assessment and Plan: * Cancer-related breakthrough pain- (present on admission) Pt presented with uncontrolled pain from metastatic cancer despite home pain regimen since starting immunotherapy a couple of days ago. CT shows no acute intra-abdominal process. Suspect cancer related pain. -Patient with some improvement on current regimen however states pain currently not fully adequately controlled and they have interval time for currently increased dose of Dilaudid as well as every 3 hours he likely will not require IV Dilaudid.Marland Kitchen 1) continue home regimen MS contin 2) continue adjusted Dilaudid dose of 4-6 mg every 4 hours as needed as per palliative care.  3) Dilaudid PCA ordered on admission which was discontinued and patient currently on IV Dilaudid as needed severe breakthrough pain which we will continue.  4) Continue home neurontin and flexeril, Cymbalta. 5) patient seen by palliative care and dose increased to 4-6 mg every 4 hours for breakthrough pain.  -Palliative care reassessing and pain medication dose being adjusted.   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[place])- (present on admission) Lung primary with metastatic disease widespread  including brain mets recently treated with rad therapy. Just started immunotherapy. -Patient's primary oncologist and neuro oncologist informed of admission via epic. -Outpatient follow-up with oncology/neurooncology. -Palliative care following.  AF (paroxysmal atrial fibrillation) ([place])- (present on admission) -Continue Toprol-XL for rate control.   -Xarelto for anticoagulation.  Hyponatremia- (present on admission) - Likely secondary to dehydration and uncontrolled pain. -Improved with hydration. -IV fluids. -Continue current pain management.  Protein-calorie malnutrition, moderate ([place])- (present on admission) - Secondary to metastatic cancer. -Continue nutritional supplementation.  Gastroesophageal reflux disease- (present on admission) - PPI.   -GI cocktail as needed.  S/P CABG x 5 - Stable. -Continue home regimen Toprol-XL, Xarelto, Ranexa -Outpatient follow-up with cardiology.  Mixed bipolar I disorder ([place])- (present on admission) - Stable. -Continue home regimen Cymbalta.  Anxiety- (present on admission) - Cymbalta.        Subjective:  -Patient laying in bed.  States pain better controlled on Dilaudid 6 mg as needed.  Patient states if interval dose was decreased to 3 hours as needed will likely not require any further IV Dilaudid for pain management.  Overall improving.  No chest pain.  No shortness of breath.  Currently afebrile.    Physical Exam: Vitals:   10/05/21 1724 10/05/21 2054 10/06/21 0406 10/06/21 1501  BP: 104/61 93/60 (!) 93/59 106/73  Pulse: 77 82 76 85  Resp:  18 18 20   Temp:  98.1 F (36.7 C) 98.2 F (36.8 C) 97.7 F (36.5 C)  TempSrc:  Oral Oral Oral  SpO2:  100% 99% 100%  Weight:      Height:       General exam: : NAD. Respiratory system: CTA B.  No wheezes, no crackles, no rhonchi.  Normal respiratory effort.  Speaking in full  sentences.  Right rib wall with tenderness to palpation.  Cardiovascular system: Regular rate rhythm  no murmurs rubs or gallops.  No JVD.  No lower extremity edema.   Gastrointestinal system: Abdomen is soft, nontender, nondistended, positive bowel sounds.  No rebound.  No guarding.  Central nervous system: Alert and oriented. No focal neurological deficits. Extremities: Symmetric 5 x 5 power. Skin: No rashes, lesions or ulcers Psychiatry: Judgement and insight appear normal. Mood & affect appropriate.  Data Reviewed:  I have Reviewed nursing notes, Vitals, and Lab results since pt's last encounter. Pertinent lab results basic metabolic profile with a sodium of 138. I have independently visualized and interpreted imaging of CT angiogram chest, CT abdomen and pelvis which showed negative for PE, negative for infiltrate, stable destructive change within the posterior 11th rib, bilateral pulmonary nodules and soft tissue masses within the soft tissues of the right chest wall not significantly changed in the short-term interval compared to earlier study of 09/15/2021.. I have discussed pt's care plan and test results with patient and nursing staff..   Family Communication: Updated patient.  No family at bedside.  Disposition: Status is: Inpatient Remains inpatient appropriate because: Severity of illness.          Planned Discharge Destination: Home with Home Health     Time spent: 40 minutes  Author: Irine Seal, MD 10/06/2021 5:00 PM  For on call review [email or web address].

## 2021-10-05 NOTE — Progress Notes (Signed)
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  Started on immunotherapy couple of days prior to admission, history of paroxysmal A. fib, CAD presenting to the ED with uncontrolled abdominal and back pain which prior to admission had recently been controlled by oral medications recently adjusted in the outpatient setting by palliative care.  Patient admitted for pain management.  Palliative care consulted for symptom management.    Assessment & Plan:   Assessment and Plan: * Cancer-related breakthrough pain- (present on admission) Pt presented with uncontrolled pain from metastatic cancer despite home pain regimen since starting immunotherapy a couple of days ago. CT shows no acute intra-abdominal process. Suspect cancer related pain. -Patient with some improvement on current regimen however states pain currently not fully adequately controlled.Marland Kitchen 1) continue home regimen MS contin 2) resume home dilaudid PO 3) Dilaudid PCA ordered on admission which was discontinued and patient currently on IV Dilaudid as needed severe breakthrough pain which we will continue.  4) Continue home neurontin and flexeril 5) palliative care consultation pending for symptom management and goals of care.   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[place])- (present on admission) Lung primary with metastatic disease widespread including brain mets recently treated with rad therapy. Just started immunotherapy. -Patient's primary oncologist and neuro oncologist informed of admission via epic. -Outpatient follow-up with oncology/neurooncology.  AF (paroxysmal atrial  fibrillation) ([place])- (present on admission) -Continue Toprol-XL for rate control.   -Xarelto for anticoagulation.  Hyponatremia- (present on admission) - Likely secondary to dehydration and uncontrolled pain. -IV fluids. -Continue current pain management.  Protein-calorie malnutrition, moderate ([place])- (present on admission) - Secondary to metastatic cancer. -Continue nutritional supplementation.  Gastroesophageal reflux disease- (present on admission) - PPI.   -GI cocktail as needed.  S/P CABG x 5 - Stable. -Continue home regimen Toprol-XL, Xarelto, Ranexa -Outpatient follow-up with cardiology.  Mixed bipolar I disorder ([place])- (present on admission) - Stable. -Continue home regimen Cymbalta.  Anxiety- (present on admission) - Cymbalta.         DVT prophylaxis: Xarelto Code Status: Partial Family Communication: Updated patient.  No family at bedside. Disposition: Home  Status is: Inpatient Remains inpatient appropriate because: Severity of illness/uncontrolled pain.           Consultants:  Palliative care pending  Procedures:  CT angiogram chest 10/02/2021 CT abdomen and pelvis 10/02/2021  Antimicrobials:  None   Subjective: Some improvement with pain control since admission however states still requiring IV pain medication to help with breakthrough pain.  No chest pain.  No shortness of breath.  Would like to wait to see palliative care's input in terms of his pain management.  Objective: Vitals:   10/04/21 0133 10/04/21 0445 10/04/21 1458 10/05/21 0424  BP: 116/74 112/69 114/74 102/68  Pulse: 94 92 (!) 106 91  Resp: 18 20 16 16   Temp: 99 F (37.2 C) 98.1 F (36.7 C) 99.1 F (37.3 C) 97.6 F (36.4 C)  TempSrc: Oral Oral Oral Oral  SpO2: 100% 96% 99% 100%  Weight:      Height:        Intake/Output Summary (Last 24 hours) at 10/05/2021 1042 Last data filed at 10/04/2021 2200  Gross per 24 hour  Intake 480 ml  Output --  Net 480 ml   Filed  Weights   10/02/21 1906  Weight: 77.1 kg    Examination:  General exam: Appears calm and comfortable  Respiratory system: Clear to auscultation. Respiratory effort normal. Cardiovascular system: S1 & S2 heard, RRR. No JVD, murmurs, rubs, gallops or clicks. No pedal edema. Gastrointestinal system: Abdomen is nondistended, soft and nontender. No organomegaly or masses felt. Normal bowel sounds heard. Central nervous system: Alert and oriented. No focal neurological deficits. Extremities: Symmetric 5 x 5 power. Skin: No rashes, lesions or ulcers Psychiatry: Judgement and insight appear normal. Mood & affect appropriate.     Data Reviewed:   CBC: Recent Labs  Lab 09/29/21 1130 10/02/21 2004 10/03/21 0443 10/04/21 0658  WBC 7.1 4.7 4.7 2.9*  NEUTROABS 5.4 4.3  --   --   HGB 10.7* 11.3* 12.0* 10.9*  HCT 32.9* 34.1* 36.9* 34.0*  MCV 91.6 90.9 91.3 92.9  PLT 174 159 154 128*    Basic Metabolic Panel: Recent Labs  Lab 09/29/21 1130 10/02/21 2004 10/03/21 0443 10/04/21 0658  NA 137 133* 129* 130*  K 3.9 3.6 3.6 4.0  CL 103 99 96* 97*  CO2 28 27 26 26   GLUCOSE 90 94 98 118*  BUN 15 20 19  24*  CREATININE 0.99 0.96 1.05 1.05  CALCIUM 10.3 9.8 9.6 9.2    GFR: Estimated Creatinine Clearance: 69.2 mL/min (by C-G formula based on SCr of 1.05 mg/dL).  Liver Function Tests: Recent Labs  Lab 09/29/21 1130 10/02/21 2004  AST 14* 22  ALT 9 18  ALKPHOS 74 72  BILITOT 0.4 0.8  PROT 7.2 7.1  ALBUMIN 3.8 3.8    CBG: No results for input(s): GLUCAP in the last 168 hours.   Recent Results (from the past 240 hour(s))  Resp Panel by RT-PCR (Flu A&B, Covid) Nasopharyngeal Swab     Status: None   Collection Time: 10/03/21  1:10 AM   Specimen: Nasopharyngeal Swab; Nasopharyngeal(NP) swabs in vial transport medium  Result Value Ref Range Status   SARS Coronavirus 2 by RT PCR NEGATIVE NEGATIVE Final    Comment: (NOTE) SARS-CoV-2 target nucleic acids are NOT  DETECTED.  The SARS-CoV-2 RNA is generally detectable in upper respiratory specimens during the acute phase of infection. The lowest concentration of SARS-CoV-2 viral copies this assay can detect is 138 copies/mL. A negative result does not preclude SARS-Cov-2 infection and should not be used as the sole basis for treatment or other patient management decisions. A negative result may occur with  improper specimen collection/handling, submission of specimen other than nasopharyngeal swab, presence of viral mutation(s) within the areas targeted by this assay, and inadequate number of viral copies(<138 copies/mL). A negative result must be combined with clinical observations, patient history, and epidemiological information. The expected result is Negative.  Fact Sheet for Patients:  EntrepreneurPulse.com.au  Fact Sheet for Healthcare Providers:  IncredibleEmployment.be  This test is no t yet approved or cleared by the [place] FDA and  has been authorized for detection and/or diagnosis of SARS-CoV-2 by FDA under an Emergency Use Authorization (EUA). This EUA will remain  in effect (meaning this test can be used) for the duration of the COVID-19 declaration under Section 564(b)(1) of the Act, 21 U.S.C.section 360bbb-3(b)(1), unless the authorization is terminated  or revoked sooner.       Influenza A by PCR NEGATIVE NEGATIVE Final   Influenza B by PCR NEGATIVE NEGATIVE  Final    Comment: (NOTE) The Xpert Xpress SARS-CoV-2/FLU/RSV plus assay is intended as an aid in the diagnosis of influenza from Nasopharyngeal swab specimens and should not be used as a sole basis for treatment. Nasal washings and aspirates are unacceptable for Xpert Xpress SARS-CoV-2/FLU/RSV testing.  Fact Sheet for Patients: EntrepreneurPulse.com.au  Fact Sheet for Healthcare Providers: IncredibleEmployment.be  This test is not yet  approved or cleared by the [place] FDA and has been authorized for detection and/or diagnosis of SARS-CoV-2 by FDA under an Emergency Use Authorization (EUA). This EUA will remain in effect (meaning this test can be used) for the duration of the COVID-19 declaration under Section 564(b)(1) of the Act, 21 U.S.C. section 360bbb-3(b)(1), unless the authorization is terminated or revoked.  Performed at [facility], [place] [address] [place], [place] [address]   Urine Culture     Status: Abnormal   Collection Time: 10/03/21  4:22 PM   Specimen: Urine, Clean Catch  Result Value Ref Range Status   Specimen Description   Final    URINE, CLEAN CATCH Performed at [facility], [place] [address] [place], [place] [address]    Special Requests   Final    NONE Performed at [facility], [place] [address] [place], [place] [address]    Culture MULTIPLE SPECIES PRESENT, SUGGEST RECOLLECTION (A)  Final   Report Status 10/05/2021 FINAL  Final  Culture, blood (Routine X 2) w Reflex to ID Panel     Status: None (Preliminary result)   Collection Time: 10/03/21  4:40 PM   Specimen: BLOOD  Result Value Ref Range Status   Specimen Description   Final    BLOOD RIGHT ANTECUBITAL Performed at [place] [address], [place], [place] [address]    Special Requests   Final    BOTTLES DRAWN AEROBIC ONLY Blood Culture adequate volume Performed at [place] [address], [place], [place] [address]    Culture   Final    NO GROWTH 2 DAYS Performed at [place] [address], [place], [place] [address]    Report Status PENDING  Incomplete  Culture, blood (Routine X 2) w Reflex to ID Panel     Status: None (Preliminary result)   Collection Time: 10/03/21  4:40 PM   Specimen: BLOOD  Result Value Ref Range Status   Specimen Description   Final    BLOOD BLOOD RIGHT ARM Performed at [place] [address], [place], [place] [address]    Special Requests   Final    BOTTLES DRAWN AEROBIC ONLY Blood Culture adequate volume Performed at [place] [address] [place], [place] [address]    Culture   Final    NO GROWTH 2 DAYS Performed at [place] [address] [place], [place] [address]    Report Status PENDING  Incomplete         Radiology Studies: No results found.      Scheduled Meds:  alum & mag hydroxide-simeth  30 mL Oral Once   aspirin EC  81 mg Oral q1600   DULoxetine  30 mg Oral QPM   feeding supplement  237 mL Oral BID BM   ferrous gluconate  324 mg Oral Q breakfast   folic acid  1 mg Oral Daily   gabapentin  100 mg Oral BID   And   gabapentin  300 mg  Oral QHS   metoprolol succinate  25 mg Oral QPM   morphine  60 mg Oral Q8H   multivitamin with minerals  1 tablet Oral Daily   pantoprazole  40 mg Oral Q0600   polyethylene glycol  17 g Oral Daily   ranolazine  1,000 mg Oral BID   rivaroxaban  20 mg Oral QPM   senna-docusate  1 tablet Oral BID   Continuous Infusions:  sodium chloride 125 mL/hr at 10/05/21 1023     LOS: 2 days    Time spent: 35 minutes    Irine Seal, MD [facility]   To contact the attending provider between 7A-7P or the covering provider during after hours 7P-7A, please log into the web site [email or web address] and access using universal [place] password for that web site. If you do not have the password, please call the hospital operator.  10/05/2021, 10:42 AM

## 2021-10-05 NOTE — Telephone Encounter (Signed)
Patient called to advise that he is in the hospital.  He went in for excessive pain and was found to have low sodium.  He states he researched some and found that low sodium levels could contribute to muscle issues.  His Sodium level up admission is 133 and lowest at 129.    Routing to Dr [name].

## 2021-10-05 NOTE — Consult Note (Signed)
Palliative Care Consult Note                                  Date: 10/05/2021   Patient Name: [name]  DOB: 1956/08/05  MRN: [id number]  Age / Sex: 66 y.o., male  PCP: [name], MD Referring Physician: Eugenie Filler, MD  Reason for Consultation: Pain control  HPI/Patient Profile: Palliative Care consult requested for goals of care discussion in this 66 y.o. 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 He was admitted on 08/29/2021 and 09/15/21 from home with uncontrolled intractable chest wall pain. He has completed all of his radiation treatments. s/p first dose of palliative systemic chemotherapy (1/31) with Carbo, arecommended by [facility]. He was admitted on 10/02/2021 with uncontrolled pain. Patient was seen at the [facility] on 09/29/20 at which time pain was controlled on regimen.   Past Medical History:  Diagnosis Date   Anemia    Anxiety    Arthritis    Basal cell carcinoma (BCC) of forehead    CAD (coronary artery disease)    a. 10/2015 ant STEMI >> LHC with 3 v CAD; oLAD tx with POBA >> emergent CABG. b. Multiple evals since that time, early graft failure of SVG-RCA by cath 03/2016. c. 2/19 PCI/DES x1 to pRCA, normal EF.   Carotid artery disease ([place])    a. 40-59% BICA 02/2018.   Depression    Dyspnea    Ectopic atrial tachycardia (HCC)    Esophageal reflux    eosinophil esophagitis   Family history of adverse reaction to anesthesia    "sister has PONV" (06/21/2017)   Former tobacco use    Gout    Hepatitis C    "treated and  cured" (06/21/2017)   High cholesterol    History of blood transfusion    History of kidney stones    Hypertension    Ischemic cardiomyopathy    a. EF 25-30% at intraop TEE 4/17  //  b. Limited Echo 5/17 - EF 45-50%, mild ant HK. c. EF 55-65% by cath 09/2017.   Migraine    "3-4/yr" (06/21/2017)   Myocardial infarction ([place]) 10/2015   Palpitations    Pneumonia    Sinus bradycardia    a. HR dropping into 40s in 02/2016 -> BB reduced.   Stroke [facility]) 10/2016   "small one; sometimes my memory/cognitive issues" (06/21/2017)   Symptomatic hypotension    a. 02/2016 ER visit -> meds reduced.   Syncope    Wears dentures    Wears glasses      Subjective:   This NP Osborne Oman reviewed medical records, received report from team, assessed the patient and then met at the patient's bedside with [name] and his wife to discuss ongoing symptom management needs.   [name] is well known to myself and the Palliative team. I am currently involved in his care at [facility] for symptom management. We have worked tirelessly to try to find a regimen to gain better pain control and decrease frequent hospitalizations.   During previous admission he was  seen by my colleague for additional recommendations and ongoing support for symptom management.    [name] is lying in bed watching TV. His wife is also present. We discussed his current home regimen and barriers to pain control. When seen in the clinic on last week he appeared comfortable and reported ability to increase activity at home, working in his office. Reports he was doing well and on Friday afternoon pain began to worsen and not relieved despite use of home regimen.   He was in communication with Anthony Soho, RN and the medical team at the [facility] regarding medications. He expressed his wife was picking up medications that were due to be filled. Unfortunately pain was unmanageable and he called 911 resulting in hospital work-up and  admission.   I discussed at length with [name] and his wife the goal of gaining better pain control and decreasing hospitalizations. I reviewed his current pain regimen expressing total daily use of MS Contin being 54m every 8 hrs totaling (180 mg/day). In addition he is taking Dilaudid 465mfor breakthrough pain, gabapentin, tizanidine for muscle spasms, and Cymbalta.   Patient states he has been able to walk 2-3 times daily totaling 3 laps each. Expressed he was trying to walk off some of the pain as he waited for next available dose.   [place] extensive cancer disease and I am concerned for increasing and uncontrolled pain. His frequent hospitalizations due to pain are also concerning.   He is clear he is not interested in focusing solely on his comfort and wishes to continue any and all treatment options available to allow him every opportunity to continue to thrive. He is not prepared to consider hospice at this time.   We discussed barriers to frequent hospitalizations and maintaining an appropriate and safe schedule allowing for continued chemotherapy treatments. I also discussed the role of pain management clinic in the setting of uncontrolled pain despite all efforts.   I discussed the importance of continued conversation with family and their medical providers regarding overall plan of care and treatment options, ensuring decisions are within the context of the patients values and GOCs.  Questions and concerns were addressed.  Patient was encouraged to call with questions or concerns.  PMT will continue to support holistically as needed.  Primary Diagnoses: Present on Admission:  Cancer-related breakthrough pain  AF (paroxysmal atrial fibrillation) (HCC)  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HCC)  Protein-calorie malnutrition, moderate (HCC)  Gastroesophageal reflux disease  Mixed bipolar I disorder (HCC)  Anxiety  Hyponatremia   Scheduled Meds:   alum & mag hydroxide-simeth  30 mL Oral Once   aspirin EC  81 mg Oral q1600   DULoxetine  30 mg Oral QPM   feeding supplement  237 mL Oral BID BM   ferrous gluconate  324 mg Oral Q breakfast   folic acid  1 mg Oral Daily   gabapentin  100 mg Oral BID   And   gabapentin  300 mg Oral QHS   metoprolol succinate  25 mg Oral QPM   morphine  60 mg Oral Q8H   multivitamin with minerals  1 tablet Oral Daily   pantoprazole  40 mg Oral Q0600   polyethylene glycol  17 g Oral Daily   ranolazine  1,000 mg Oral BID   rivaroxaban  20 mg Oral QPM   senna-docusate  1 tablet Oral BID    Continuous Infusions:  sodium chloride 125 mL/hr at 10/05/21 1530    PRN  Meds: acetaminophen, alum & mag hydroxide-simeth **AND** lidocaine, bisacodyl, HYDROmorphone (DILAUDID) injection, HYDROmorphone, ondansetron **OR** ondansetron (ZOFRAN) IV, prochlorperazine, tiZANidine  Allergies  Allergen Reactions   Prednisone Other (See Comments)    States that this med makes him "crazy"   Tetanus Toxoids Swelling and Other (See Comments)    Fever, Swelling of the arm    Wellbutrin [Bupropion] Other (See Comments)    Crazy thoughts, nightmares   Varenicline Other (See Comments)    Unpleasant dreams    Review of Systems  Musculoskeletal:  Positive for arthralgias and back pain.  Unless otherwise noted, a complete review of systems is negative.  Physical Exam General: NAD, thin, comfortable appearing  Cardiovascular: regular rate and rhythm Pulmonary: Normal breathing pattern Extremities: no edema, no joint deformities Skin: no rashes, warm and dry Neurological: AAO x4  Vital Signs:  BP 105/68 (BP Location: Left Arm)    Pulse 96    Temp 97.9 F (36.6 C) (Oral)    Resp 18    Ht 5' 9"  (1.753 m)    Wt 77.1 kg    SpO2 100%    BMI 25.10 kg/m  Pain Scale: 0-10 POSS *See Group Information*: 1-Acceptable,Awake and alert Pain Score: 4   SpO2: SpO2: 100 % O2 Device:SpO2: 100 % O2 Flow Rate: .   IO:  Intake/output summary:  Intake/Output Summary (Last 24 hours) at 10/05/2021 1636 Last data filed at 10/05/2021 1530 Gross per 24 hour  Intake 853.66 ml  Output --  Net 853.66 ml    LBM: Last BM Date: 10/02/21 Baseline Weight: Weight: 77.1 kg Most recent weight: Weight: 77.1 kg      Palliative Assessment/Data:    Advanced Care Planning:   Primary Decision Maker: PATIENT  Code Status/Advance Care Planning: Limited code  Assessment & Plan:   SUMMARY OF RECOMMENDATIONS   Continue with current plan of care  PMT will continue to support and follow for ongoing symptom management. Please call or secure chat with urgent needs.  Symptom Management:  Neoplasm related pain MS Contin 60 mg every 8hrs (this was increased on 1/21 from 40 mg). Previous attempts to use methadone will discuss further with him as this would be next option vs seeking additional supportive care)  Dilaudid 4-6 mg every 4 hours for breakthrough pain  Continue Gabapentin 100 mg BID and 377m at bedtime (may consider increasing BID dosing to 2086m Continue Cymbalta 30 mg QHS Tizandidine 31m80mHS as needed for muscle spasms Given he is taking immunotherapy unable to utilize steroids.  2. Constipation  Miralax daily  Senna-S twice daily Dulcolax as needed  3. Nausea  Compazine 10 mg as needed Zofran 4mg76m needed     Palliative Prophylaxis:  Bowel Regimen and Frequent Pain Assessment  Additional Recommendations (Limitations, Scope, Preferences): Full Scope Treatment  Psycho-social/Spiritual:  Desire for further Chaplaincy support: no Additional Recommendations: Education on Hospice  Prognosis:  POOR   Discharge Planning:  Home with Palliative Services   Patient and wife expressed understanding and was in agreement with this plan.   Time Total: 50 min.   Visit consisted of counseling and education dealing with the complex and emotionally intense issues of symptom management and palliative care in  the setting of serious and potentially life-threatening illness.Greater than 50%  of this time was spent counseling and coordinating care related to the above assessment and plan.  Signed by:  NikkAlda LeaPCNP-BC Palliative Medicine Team  Phone: [phone]: [phone]: N. CBjorn Pippinhank  you for allowing the Palliative Medicine Team to assist in the care of this patient. Please utilize secure chat with additional questions, if there is no response within 30 minutes please call the above phone number. Palliative Medicine Team providers are available by phone from 7am to 5pm daily and can be reached through the team cell phone.  Should this patient require assistance outside of these hours, please call the patient's attending physician.

## 2021-10-06 ENCOUNTER — Ambulatory Visit (HOSPITAL_COMMUNITY): Payer: Medicare Other

## 2021-10-06 ENCOUNTER — Other Ambulatory Visit: Payer: Self-pay | Admitting: Nurse Practitioner

## 2021-10-06 ENCOUNTER — Other Ambulatory Visit: Payer: Medicare Other

## 2021-10-06 DIAGNOSIS — S21209A Unspecified open wound of unspecified back wall of thorax without penetration into thoracic cavity, initial encounter: Secondary | ICD-10-CM | POA: Diagnosis present

## 2021-10-06 LAB — BASIC METABOLIC PANEL
Anion gap: 5 (ref 5–15)
BUN: 25 mg/dL — ABNORMAL HIGH (ref 8–23)
CO2: 25 mmol/L (ref 22–32)
Calcium: 9.3 mg/dL (ref 8.9–10.3)
Chloride: 108 mmol/L (ref 98–111)
Creatinine, Ser: 0.83 mg/dL (ref 0.61–1.24)
GFR, Estimated: >60 mL/min (ref >60)
Glucose, Bld: 97 mg/dL (ref 70–99)
Potassium: 5.3 mmol/L — ABNORMAL HIGH (ref 3.5–5.1)
Sodium: 138 mmol/L (ref 135–145)

## 2021-10-06 LAB — MAGNESIUM: Magnesium: 1.9 mg/dL (ref 1.7–2.4)

## 2021-10-06 LAB — POTASSIUM: Potassium: 4 mmol/L (ref 3.5–5.1)

## 2021-10-06 MED ORDER — HYDROMORPHONE HCL 1 MG/ML IJ SOLN
0.5000 mg | INTRAMUSCULAR | Status: DC | PRN
  Administered 2021-10-06 – 2021-10-07 (×4): 0.5 mg via INTRAVENOUS
  Filled 2021-10-06 (×5): qty 0.5

## 2021-10-06 MED ORDER — DULOXETINE HCL 30 MG PO CPEP: 30.0000 mg | ORAL_CAPSULE | Freq: Two times a day (BID) | ORAL | 3 refills | Status: DC

## 2021-10-06 MED ORDER — HYDROMORPHONE HCL 2 MG PO TABS
2.0000 mg | ORAL_TABLET | ORAL | 0 refills | Status: DC | PRN
  Filled 2021-10-06: qty 120, 15d supply, fill #0

## 2021-10-06 MED ORDER — PANTOPRAZOLE SODIUM 40 MG PO TBEC
40.0000 mg | DELAYED_RELEASE_TABLET | Freq: Every day | ORAL | 1 refills | Status: DC
  Filled 2021-10-06: qty 30, 30d supply, fill #0

## 2021-10-06 MED ORDER — HYDROMORPHONE HCL 4 MG PO TABS: 4.0000 mg | ORAL_TABLET | ORAL | 0 refills | Status: DC | PRN

## 2021-10-06 MED ORDER — GABAPENTIN 300 MG PO CAPS: 300.0000 mg | ORAL_CAPSULE | Freq: Three times a day (TID) | ORAL | Status: DC

## 2021-10-06 MED ORDER — GABAPENTIN 300 MG PO CAPS
300.0000 mg | ORAL_CAPSULE | Freq: Three times a day (TID) | ORAL | Status: DC
  Administered 2021-10-06 – 2021-10-08 (×6): 300 mg via ORAL
  Filled 2021-10-06 (×6): qty 1

## 2021-10-06 MED ORDER — GABAPENTIN 100 MG PO CAPS
200.0000 mg | ORAL_CAPSULE | Freq: Once | ORAL | Status: AC
  Administered 2021-10-06: 200 mg via ORAL
  Filled 2021-10-06: qty 2

## 2021-10-06 MED ORDER — SENNOSIDES-DOCUSATE SODIUM 8.6-50 MG PO TABS: 1.0000 | ORAL_TABLET | Freq: Two times a day (BID) | ORAL | Status: DC

## 2021-10-06 MED ORDER — HYDROMORPHONE HCL 4 MG PO TABS
4.0000 mg | ORAL_TABLET | ORAL | Status: DC | PRN
  Administered 2021-10-06 – 2021-10-07 (×6): 6 mg via ORAL
  Filled 2021-10-06 (×6): qty 2

## 2021-10-06 MED ORDER — DULOXETINE HCL 30 MG PO CPEP
30.0000 mg | ORAL_CAPSULE | Freq: Two times a day (BID) | ORAL | Status: DC
  Administered 2021-10-06 – 2021-10-08 (×4): 30 mg via ORAL
  Filled 2021-10-06 (×4): qty 1

## 2021-10-06 NOTE — Care Management Important Message (Signed)
Important Message  Patient Details IM Letter given to the Patient. Name: [name] MRN: [id number] Date of Birth: 30-Mar-1956   Medicare Important Message Given:  Yes     Kerin Salen 10/06/2021, 9:30 AM

## 2021-10-06 NOTE — Progress Notes (Signed)
Chaplain follow-up with Simona Huh from a previous visit.  Adonus shared about how he has been doing since his last hospitalization.  Regis voiced that he has been experiencing a lot of pain in his bones but otherwise that he feels great.  He stated that if he didn't have the pain in his body he wouldn't even know that he had cancer.  Zakar continues to be optimistic and show a lot of strength concerning his symptoms and diagnosis.    Rei believes that everything is in [employer].  He recognizes that he is not in control.  Though he plans on fighting and exhausting all of his options to continue living, Niels also understands that he doesn't know what will happen.  Kayston leans into his faith to help him continue going and reconcile what has been happening to his body.    Chaplain continues to offer support, listening, presence, and prayer with Sharlet Salina.     10/06/21 1500  Clinical Encounter Type  Visited With Patient  Visit Type Follow-up;Spiritual support

## 2021-10-06 NOTE — Progress Notes (Signed)
Initial Nutrition Assessment  DOCUMENTATION CODES:   Non-severe (moderate) malnutrition in context of chronic illness  INTERVENTION:  -Continue Ensure BID, between meals. 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   GOAL:   Patient will meet greater than or equal to 90% of their needs   MONITOR:   PO intake, Supplement acceptance, Weight trends  REASON FOR ASSESSMENT:   Malnutrition Screening Tool    ASSESSMENT:   Pt is a 66 y.o. 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  Met with pt at bedside. Per pt, pt endorses a good appetite since admission. Pt reports that he ate a banana, cereal, fruit cup, and whole milk for breakfast today. Per pt, he ate a hamburger, fruit cup and banana for dinner last night. Pt reports that family member brought in a [place] [employer] sub for him yesterday. Per pt, pt reports that he has had a good appetite since around Thanksgiving of last year. Per pt, he recently went ~9 days where his pain was controlled and he had a really good appetite, eating 2-3 meals a day. Per pt, when he was in pain last week that he did have a  decreased appetite from last Thursday to last Saturday night. Pt reports that he went through chemotherapy last summer that was really rough on his body and that he had a poor appetite from his cancer diagnosis (April 2022) up until November 2022, when his appetite started to come back. No chewing  or swallowing difficulties noted. Pt reports that he has been eating well since in the hospital and that he likes the Ensure ONS, however, does not typically drink them if he is eating all of his meals.   Per chart review, 100% of breakfast and lunch was consumed yesterday (2/6), and 100% of breakfast and lunch on 2/5.   Encourage pt to continue eating consistent meals throughout the day and encouraged family to continue to bring food in for pt. Discussed the importance of adequate po intake for energy/strength.   Weight encounters hx reviewed. Per weight hx, pt is currently 170# and weight appears stable. Pt has experienced a 4% weight increase over the past 2 months. Pt reports that he went from 210# to 160# since his cancer diagnosis (April 2022) and that this weight loss was unintentional.   Medications reviewed. Ensure Enlive BID, ferrous gluconate, folic acid, protonix, MVI with minerals, miralax, senna-docusate, zofran PRN  Labs reviewed.    NUTRITION - FOCUSED PHYSICAL EXAM:  Flowsheet Row Most Recent Value  Orbital Region Mild depletion  Upper Arm Region Moderate depletion  Thoracic and Lumbar Region No depletion  Buccal Region Mild depletion  Temple Region Mild depletion  Clavicle Bone Region Mild depletion  Clavicle and Acromion Bone Region Mild depletion  Scapular Bone Region Mild depletion  Dorsal Hand No depletion  Patellar Region Mild depletion  Anterior Thigh Region Mild depletion  Posterior Calf Region Mild depletion  Edema (RD Assessment) None  Hair Reviewed  Eyes Reviewed  Mouth Reviewed  Skin Reviewed  Nails Reviewed       Diet Order:   Diet Order             Diet regular Room service appropriate? Yes; Fluid consistency: Thin  Diet effective now                   EDUCATION NEEDS:   Education needs have been addressed  Skin:  Skin Assessment: Skin Integrity Issues: Skin Integrity Issues:: Other (Comment) Other: abrasion  Last BM:   2/7  Height:   Ht Readings from Last 1 Encounters:  10/02/21 5' 9"  (1.753 m)    Weight:   Wt Readings from Last 1 Encounters:  10/02/21 77.1 kg    Ideal Body Weight:  72.7 kg  BMI:  Body mass index is 25.1 kg/m.  Estimated Nutritional Needs:   Kcal:  2200 - 2400  Protein:  105 - 120 grams  Fluid:  > 2.2 L    [name], Dietetic Intern 10/06/2021 4:13 PM

## 2021-10-06 NOTE — Assessment & Plan Note (Addendum)
-  Patient seen by [facility] RN and recommendations made for wound care. -Outpatient follow-up with oncologist.

## 2021-10-06 NOTE — Progress Notes (Addendum)
PROGRESS NOTE    [name]  HTD:[id number] DOB: 11-22-1955 DOA: 10/02/2021 PCP: [name], MD    Chief Complaint  Patient presents with   Abdominal Pain   Back Pain    Brief Narrative:  Patient is a pleasant 66 year old unfortunate gentleman history of metastatic neuroendocrine lung cancer with mets to the brain treated with radiation, recent progression of disease.  Started on immunotherapy couple of days prior to admission, history of paroxysmal A. fib, CAD presenting to the ED with uncontrolled abdominal and back pain which prior to admission had recently been controlled by oral medications recently adjusted in the outpatient setting by palliative care.  Patient admitted for pain management.  Palliative care consulted for symptom management.    Assessment & Plan:   Assessment and Plan: * Cancer-related breakthrough pain- (present on admission) Pt presented with uncontrolled pain from metastatic cancer despite home pain regimen since starting immunotherapy a couple of days ago. CT shows no acute intra-abdominal process. Suspect cancer related pain. -Patient with some improvement on current regimen however states pain currently not fully adequately controlled and they have interval time for currently increased dose of Dilaudid as well as every 3 hours he likely will not require IV Dilaudid.Marland Kitchen 1) continue home regimen MS contin 2) continue adjusted Dilaudid dose of 4-6 mg every 4 hours as needed as per palliative care.  3) Dilaudid PCA ordered on admission which was discontinued and patient currently on IV Dilaudid as needed severe breakthrough pain which we will continue.  4) Continue home neurontin and flexeril, Cymbalta. 5) patient seen by palliative care and dose increased to 4-6 mg every 4 hours for breakthrough pain.  -Palliative care reassessing and pain medication dose being adjusted.   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[name])- (present on  admission) Lung primary with metastatic disease widespread including brain mets recently treated with rad therapy. Just started immunotherapy. -Patient's primary oncologist and neuro oncologist informed of admission via epic. -Outpatient follow-up with oncology/neurooncology. -Palliative care following.  AF (paroxysmal atrial fibrillation) ([name])- (present on admission) -Continue Toprol-XL for rate control.   -Xarelto for anticoagulation.  Wound of back- (present on admission) - Patient with a 1.5 cm round chronic nonhealing lesion on the mid thoracic region of the spine concern for neoplastic versus benign lesion. -Patient seen by [facility] RN and recommendations made for wound care. -Outpatient follow-up with oncologist.  Hyponatremia- (present on admission) - Likely secondary to dehydration and uncontrolled pain. -Improved with hydration. -IV fluids. -Continue current pain management.  Protein-calorie malnutrition, moderate ([place])- (present on admission) - Secondary to metastatic cancer. -Continue nutritional supplementation.  Gastroesophageal reflux disease- (present on admission) - PPI.   -GI cocktail as needed.  S/P CABG x 5 - Stable. -Continue home regimen Toprol-XL, Xarelto, Ranexa -Outpatient follow-up with cardiology.  Mixed bipolar I disorder ([name])- (present on admission) - Stable. -Continue home regimen Cymbalta.  Anxiety- (present on admission) - Cymbalta.         DVT prophylaxis: Xarelto Code Status: Partial Family Communication: Updated patient.  No family at bedside. Disposition: Home hopefully tomorrow once preauthorization received for new dose of home pain medication.  Status is: Inpatient Remains inpatient appropriate because: Severity of illness/uncontrolled pain.           Consultants:  Palliative care: Jobe Gibbon, NP 10/05/2021  Procedures:  CT angiogram chest 10/02/2021 CT abdomen and pelvis 10/02/2021  Antimicrobials:   None   Subjective: Some improvement with pain control since admission however states still requiring IV pain  medication to help with breakthrough pain.  States increased dose of Dilaudid to 6 mg has helped his pain however if interval was changed to every 3 hours as needed likely will not require IV pain medication.  No chest pain.  No shortness of breath.   Objective: Vitals:   10/05/21 2054 10/06/21 0406 10/06/21 1501 10/06/21 1743  BP: 93/60 (!) 93/59 106/73 108/68  Pulse: 82 76 85 72  Resp: 18 18 20    Temp: 98.1 F (36.7 C) 98.2 F (36.8 C) 97.7 F (36.5 C)   TempSrc: Oral Oral Oral   SpO2: 100% 99% 100%   Weight:      Height:        Intake/Output Summary (Last 24 hours) at 10/06/2021 1808 Last data filed at 10/06/2021 1502 Gross per 24 hour  Intake 3192.79 ml  Output --  Net 3192.79 ml   Filed Weights   10/02/21 1906  Weight: 77.1 kg    Examination:  General exam: NAD Respiratory system: CTA B.  No wheezes, no crackles, no rhonchi.  Normal respiratory effort.  Speaking in full sentences. Cardiovascular system: Regular rate rhythm no murmurs rubs or gallops.  No JVD.  No lower extremity edema.  Gastrointestinal system: Abdomen is nondistended, soft and nontender. No organomegaly or masses felt. Normal bowel sounds heard. Central nervous system: Alert and oriented. No focal neurological deficits. Extremities: Symmetric 5 x 5 power. Skin: No rashes, lesions or ulcers Psychiatry: Judgement and insight appear normal. Mood & affect appropriate.     Data Reviewed:   CBC: Recent Labs  Lab 10/02/21 2004 10/03/21 0443 10/04/21 0658  WBC 4.7 4.7 2.9*  NEUTROABS 4.3  --   --   HGB 11.3* 12.0* 10.9*  HCT 34.1* 36.9* 34.0*  MCV 90.9 91.3 92.9  PLT 159 154 128*    Basic Metabolic Panel: Recent Labs  Lab 10/02/21 2004 10/03/21 0443 10/04/21 0658 10/06/21 0534 10/06/21 0907  NA 133* 129* 130* 138  --   K 3.6 3.6 4.0 5.3* 4.0  CL 99 96* 97* 108  --   CO2 27  26 26 25   --   GLUCOSE 94 98 118* 97  --   BUN 20 19 24* 25*  --   CREATININE 0.96 1.05 1.05 0.83  --   CALCIUM 9.8 9.6 9.2 9.3  --   MG  --   --   --  1.9  --     GFR: Estimated Creatinine Clearance: 87.5 mL/min (by C-G formula based on SCr of 0.83 mg/dL).  Liver Function Tests: Recent Labs  Lab 10/02/21 2004  AST 22  ALT 18  ALKPHOS 72  BILITOT 0.8  PROT 7.1  ALBUMIN 3.8    CBG: No results for input(s): GLUCAP in the last 168 hours.   Recent Results (from the past 240 hour(s))  Resp Panel by RT-PCR (Flu A&B, Covid) Nasopharyngeal Swab     Status: None   Collection Time: 10/03/21  1:10 AM   Specimen: Nasopharyngeal Swab; Nasopharyngeal(NP) swabs in vial transport medium  Result Value Ref Range Status   SARS Coronavirus 2 by RT PCR NEGATIVE NEGATIVE Final    Comment: (NOTE) SARS-CoV-2 target nucleic acids are NOT DETECTED.  The SARS-CoV-2 RNA is generally detectable in upper respiratory specimens during the acute phase of infection. The lowest concentration of SARS-CoV-2 viral copies this assay can detect is 138 copies/mL. A negative result does not preclude SARS-Cov-2 infection and should not be used as the sole basis for treatment  or other patient management decisions. A negative result may occur with  improper specimen collection/handling, submission of specimen other than nasopharyngeal swab, presence of viral mutation(s) within the areas targeted by this assay, and inadequate number of viral copies(<138 copies/mL). A negative result must be combined with clinical observations, patient history, and epidemiological information. The expected result is Negative.  Fact Sheet for Patients:  EntrepreneurPulse.com.au  Fact Sheet for Healthcare Providers:  IncredibleEmployment.be  This test is no t yet approved or cleared by the [place] FDA and  has been authorized for detection and/or diagnosis of SARS-CoV-2 by FDA under  an Emergency Use Authorization (EUA). This EUA will remain  in effect (meaning this test can be used) for the duration of the COVID-19 declaration under Section 564(b)(1) of the Act, 21 U.S.C.section 360bbb-3(b)(1), unless the authorization is terminated  or revoked sooner.       Influenza A by PCR NEGATIVE NEGATIVE Final   Influenza B by PCR NEGATIVE NEGATIVE Final    Comment: (NOTE) The Xpert Xpress SARS-CoV-2/FLU/RSV plus assay is intended as an aid in the diagnosis of influenza from Nasopharyngeal swab specimens and should not be used as a sole basis for treatment. Nasal washings and aspirates are unacceptable for Xpert Xpress SARS-CoV-2/FLU/RSV testing.  Fact Sheet for Patients: EntrepreneurPulse.com.au  Fact Sheet for Healthcare Providers: IncredibleEmployment.be  This test is not yet approved or cleared by the [place] FDA and has been authorized for detection and/or diagnosis of SARS-CoV-2 by FDA under an Emergency Use Authorization (EUA). This EUA will remain in effect (meaning this test can be used) for the duration of the COVID-19 declaration under Section 564(b)(1) of the Act, 21 U.S.C. section 360bbb-3(b)(1), unless the authorization is terminated or revoked.  Performed at [facility], [place] [address], [place], [place] [address]   Urine Culture     Status: Abnormal   Collection Time: 10/03/21  4:22 PM   Specimen: Urine, Clean Catch  Result Value Ref Range Status   Specimen Description   Final    URINE, CLEAN CATCH Performed at [facility], [place] [address] [place], [place] [address]    Special Requests   Final    NONE Performed at [facility], [place] [address], [place], [place] [address]    Culture MULTIPLE SPECIES PRESENT, SUGGEST RECOLLECTION (A)  Final   Report Status 10/05/2021 FINAL  Final  Culture, blood (Routine X 2) w Reflex to ID Panel     Status: None  (Preliminary result)   Collection Time: 10/03/21  4:40 PM   Specimen: BLOOD  Result Value Ref Range Status   Specimen Description   Final    BLOOD RIGHT ANTECUBITAL Performed at [place] [address], [place], [place] [address]    Special Requests   Final    BOTTLES DRAWN AEROBIC ONLY Blood Culture adequate volume Performed at [place] [address], [place], [place] [address]    Culture   Final    NO GROWTH 3 DAYS Performed at [facility], [place] [address] [place], [place] [address]    Report Status PENDING  Incomplete  Culture, blood (Routine X 2) w Reflex to ID Panel     Status: None (Preliminary result)   Collection Time: 10/03/21  4:40 PM   Specimen: BLOOD  Result Value Ref Range Status   Specimen Description   Final    BLOOD BLOOD RIGHT ARM Performed at [facility],  [place] [address], [place], [place] [address]    Special Requests   Final    BOTTLES DRAWN AEROBIC ONLY Blood Culture adequate volume Performed at [place] [address], [place], [place] [address]    Culture   Final    NO GROWTH 3 DAYS Performed at [facility], [place] [address] [place],  [address]    Report Status PENDING  Incomplete         Radiology Studies: No results found.      Scheduled Meds:  alum & mag hydroxide-simeth  30 mL Oral Once   aspirin EC  81 mg Oral q1600   DULoxetine  30 mg Oral BID   feeding supplement  237 mL Oral BID BM   ferrous gluconate  324 mg Oral Q breakfast   folic acid  1 mg Oral Daily   gabapentin  300 mg Oral TID   metoprolol succinate  25 mg Oral QPM   morphine  60 mg Oral Q8H   multivitamin with minerals  1 tablet Oral Daily   pantoprazole  40 mg Oral Q0600   polyethylene glycol  17 g Oral Daily   ranolazine  1,000 mg Oral BID   rivaroxaban  20 mg Oral QPM   senna-docusate  1 tablet Oral BID   Continuous Infusions:  sodium chloride 100  mL/hr at 10/06/21 1744     LOS: 3 days    Time spent: 35 minutes    Irine Seal, MD [facility]   To contact the attending provider between 7A-7P or the covering provider during after hours 7P-7A, please log into the web site [email or web address] and access using universal [place] password for that web site. If you do not have the password, please call the hospital operator.  10/06/2021, 6:08 PM

## 2021-10-06 NOTE — Progress Notes (Signed)
° °  Palliative Medicine Inpatient Follow Up Note     Chart Reviewed. Patient assessed at the bedside.   [name] is resting in bed. On the phone upon arrival. Reports pain is much better controlled with recent adjustments although does escalate intermittently.   We discussed current regimen. He is taking 6mg  Dilaudid every 4 hours however reports pain increases in about 3-3.5hrs. Education provided on adjustments with a goal of discharging home in the next 24 hours. He has all medications needed in the home. I will need to adjust his oral dilaudid and new prescription has been sent to [place] [facility]. Hopefully this will be available to pick up on tomorrow morning.   He is scheduled for spine MRI on Saturday.   Discussed the importance of continued conversation with family and their  medical providers regarding overall plan of care and treatment options, ensuring decisions are within the context of the patients values and GOCs.   Questions addressed and support provided.    Objective Assessment: Vital Signs Vitals:   10/06/21 0406 10/06/21 1501  BP: (!) 93/59 106/73  Pulse: 76 85  Resp: 18 20  Temp: 98.2 F (36.8 C) 97.7 F (36.5 C)  SpO2: 99% 100%    Intake/Output Summary (Last 24 hours) at 10/06/2021 1647 Last data filed at 10/06/2021 1502 Gross per 24 hour  Intake 3192.79 ml  Output --  Net 3192.79 ml   Last Weight  Most recent update: 10/02/2021  7:07 PM    Weight  77.1 kg (170 lb)            Gen:  NAD Neuro: Alert and oriented x4  SUMMARY OF RECOMMENDATIONS   Continue with current plan of care per medical team  No prescriptions will be needed at discharge (opioids) patient has sufficient home supply.  PMT will continue to support and follow. Please secure chat for urgent needs.   Symptom Management:  Neoplasm related pain MS Contin 60 mg every 8hrs (this was increased on 1/21 from 40 mg). Previous attempts to use methadone will discuss further with him  as this would be next option vs seeking additional supportive care)  Dilaudid 4-6 mg every 3hours for breakthrough pain  Gabapentin 300 mg TID and 300mg   Cymbalta 30 mg BID Tizandidine 2mg  QHS as needed for muscle spasms Given he is taking immunotherapy unable to utilize steroids.  2. Constipation  Miralax daily  Senna-S twice daily Dulcolax as needed  3. Nausea  Compazine 10 mg as needed Zofran 4mg  as needed                       Discussed with Dr. Grandville Silos and RN.   Time Total: 35 min.   Visit consisted of counseling and education dealing with the complex and emotionally intense issues of symptom management and palliative care in the setting of serious and potentially life-threatening illness.Greater than 50%  of this time was spent counseling and coordinating care related to the above assessment and plan.  Alda Lea, AGPCNP-BC  Palliative Medicine Team   Palliative Medicine Team providers are available by phone from 7am to 7pm daily and can be reached through the team cell phone. Should this patient require assistance outside of these hours, please call the patient's attending physician.

## 2021-10-07 ENCOUNTER — Other Ambulatory Visit (HOSPITAL_COMMUNITY): Payer: Self-pay

## 2021-10-07 MED ORDER — HYDROMORPHONE HCL 1 MG/ML IJ SOLN
1.0000 mg | Freq: Four times a day (QID) | INTRAMUSCULAR | Status: DC | PRN
  Administered 2021-10-07 – 2021-10-08 (×4): 1 mg via INTRAVENOUS
  Filled 2021-10-07 (×4): qty 1

## 2021-10-07 MED ORDER — HYDROMORPHONE HCL 1 MG/ML IJ SOLN
1.0000 mg | Freq: Once | INTRAMUSCULAR | Status: AC
  Administered 2021-10-07: 1 mg via INTRAVENOUS
  Filled 2021-10-07: qty 1

## 2021-10-07 MED ORDER — HYDROMORPHONE HCL 2 MG PO TABS: 2.0000 mg | ORAL_TABLET | ORAL | 0 refills | Status: DC | PRN

## 2021-10-07 MED ORDER — HYDROMORPHONE HCL 2 MG PO TABS
6.0000 mg | ORAL_TABLET | ORAL | Status: DC | PRN
  Administered 2021-10-07: 6 mg via ORAL
  Administered 2021-10-07 – 2021-10-08 (×6): 8 mg via ORAL
  Filled 2021-10-07 (×2): qty 4
  Filled 2021-10-07: qty 3
  Filled 2021-10-07 (×2): qty 4
  Filled 2021-10-07: qty 3
  Filled 2021-10-07 (×2): qty 4

## 2021-10-07 NOTE — Progress Notes (Signed)
Palliative Medicine Inpatient Follow Up Note     Chart Reviewed. Patient assessed at the bedside.   Awake and alert. Reports he had a restful night however early in the morning was awaken by pain that radiated to his shoulder which was new for him. Current regimen followed with some relief but still with concern of the significance of his pain. We discussed upcoming spinal MRI on Saturday for further evaluation.   Patient was initially planned for discharge today however expresses his discomfort and concerns with having to return back to hospital due to uncontrolled pain. He shares if he was home and pain was as severe as he experienced this morning he would have returned.   Discussed at length his current regimen with limitations due to recent changes and increase. We have worked diligently during previous admissions and outpatient to achieve a manageable regimen with a goal of comfort and a pain level of (3-5) as set by patient. I again expressed concerns with amounts of medications with concern of increasing respiratory depression and neuro involvement. Patient verbalized understanding. Education provided on outpatient resources and additional support to assist in gaining improved comfort.   Educations and recommendations provided on outpatient palliative support. He request time to think about their involvement. He does not wish to move forward with referral.   Dr. Tyrell Antonio also at the bedside during visit. Mutual decision to continue ongoing pain management for an additional 24 hrs with hopes of some improvement and ability to discharge home on tomorrow.   He is scheduled for spine MRI on Saturday.   Discussed the importance of continued conversation with family and their  medical providers regarding overall plan of care and treatment options, ensuring decisions are within the context of the patients values and GOCs.   Questions addressed and support provided.    Objective  Assessment: Vital Signs Vitals:   10/07/21 0452 10/07/21 1311  BP: 126/68 111/67  Pulse: 76 72  Resp: 14 18  Temp: 97.8 F (36.6 C) 98.4 F (36.9 C)  SpO2: 100% 98%    Intake/Output Summary (Last 24 hours) at 10/07/2021 1714 Last data filed at 10/07/2021 0750 Gross per 24 hour  Intake 1919.24 ml  Output --  Net 1919.24 ml    Last Weight  Most recent update: 10/02/2021  7:07 PM    Weight  77.1 kg (170 lb)            Gen:  NAD Neuro: Alert and oriented x4  SUMMARY OF RECOMMENDATIONS   Continue with current plan of care per medical team  No prescriptions will be needed at discharge (opioids) patient has sufficient home supply. Dilaudid prescription 2mg  is readily available to pick-up at [facility] outpatient pharmacy at discharge. PMT will continue to support and follow. Please secure chat for urgent needs.   Symptom Management:  Neoplasm related pain MS Contin 60 mg every 8hrs (this was increased on 1/21 from 40 mg). Previous attempts to use methadone will discuss further with him as this would be next option vs seeking additional supportive care)  Dilaudid increased to 6-8 mg every 3hours for breakthrough pain  Dilaudid 1mg  IV every 6 hours as needed for breakthrough pain not resolved by oral dilaudid.  Gabapentin 300 mg TID and 300mg  (increased on 2/7) Cymbalta 30 mg BID (increased on 2/7) Tizandidine 2mg  QHS as needed for muscle spasms Given he is taking immunotherapy unable to utilize steroids.  2. Constipation  Miralax daily  Senna-S twice daily Dulcolax as needed  3. Nausea  Compazine 10 mg as needed Zofran 4mg  as needed                       Discussed with Dr. Tyrell Antonio and RN.   Time Total: 45 min.   Visit consisted of counseling and education dealing with the complex and emotionally intense issues of symptom management and palliative care in the setting of serious and potentially life-threatening illness.Greater than 50%  of this time was spent counseling and  coordinating care related to the above assessment and plan.  Alda Lea, AGPCNP-BC  Palliative Medicine Team [phone]    Palliative Medicine Team providers are available by phone from 7am to 7pm daily and can be reached through the team cell phone. Should this patient require assistance outside of these hours, please call the patient's attending physician.

## 2021-10-07 NOTE — Progress Notes (Signed)
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  Started on immunotherapy couple of days prior to admission, history of paroxysmal A. fib, CAD presenting to the ED with uncontrolled abdominal and back pain which prior to admission had recently been controlled by oral medications recently adjusted in the outpatient setting by palliative care.  Patient admitted for pain management.  Palliative care consulted for symptom management.  Assessment and Plan: * Cancer-related breakthrough pain- (present on admission) Pt presented with uncontrolled pain from metastatic cancer despite home pain regimen since starting immunotherapy a couple of days ago. -CT shows no acute intra-abdominal process. -Suspect cancer related pain. - Continue MS contin TID - Dilaudid dose of 4-6 mg every 4 hours as needed as per palliative care on 2/07. -Continue home neurontin and flexeril, Cymbalta. -Reported worsening pain this am. Plan to keep him overnight, he will avoid to be long period of time without breakthrough pain meds.   Wound of back- (present on admission) -Patient seen by [facility] RN and recommendations made for wound care. -Outpatient follow-up with oncologist.  Hyponatremia- (present on admission) - Likely secondary to dehydration and uncontrolled pain. -Improved with hydration. -Continue current pain management.  Protein-calorie malnutrition, moderate ([place])- (present on admission) - Secondary to metastatic cancer. -Continue nutritional supplementation.  AF (paroxysmal atrial fibrillation) ([place])- (present on admission) -Continue Toprol-XL.  -Continue with Xarelto.   Malignant poorly differentiated  neuroendocrine carcinoma ([place])- (present on admission) Lung primary with metastatic disease widespread including brain mets recently treated with rad therapy. Just started immunotherapy. -Patient's primary oncologist and neuro oncologist informed of admission. -Outpatient follow-up with oncology/neurooncology. -Palliative care following.  Gastroesophageal reflux disease- (present on admission) - Continue with PPI.   -GI cocktail as needed.  S/P CABG x 5 - Stable. -Continue home regimen Toprol-XL, Xarelto, Ranexa -Outpatient follow-up with cardiology.  Mixed bipolar I disorder ([place])- (present on admission) - Stable. -Continue home regimen Cymbalta.  Anxiety- (present on admission) - Continue with Cymbalta.        Subjective: he had severe abdominal pain radiating to scapula today, first time pain radiates to scapula. Denies worsening pain after eating.   Physical Exam: Vitals:   10/06/21 1743 10/06/21 2206 10/07/21 0452 10/07/21 1311  BP: 108/68 97/62 126/68 111/67  Pulse: 72 77 76 72  Resp:  14 14 18   Temp:  97.7 F (36.5 C) 97.8 F (36.6 C) 98.4 F (36.9 C)  TempSrc:  Oral Oral Oral  SpO2:  98% 100% 98%  Weight:      Height:       General; alert.  Neuro; oriented, alert, moves extremities.   Data Reviewed:  Labs from yesterday reviewed.   Family Communication: care discussed with patient.   Disposition: Status is: Inpatient Remains inpatient appropriate because: for pain management.           Planned Discharge Destination: Home     Time spent: 45 minutes  Author: Elmarie Shiley, MD 10/07/2021 4:17 PM  For on call review [email or web address].

## 2021-10-08 ENCOUNTER — Inpatient Hospital Stay (HOSPITAL_COMMUNITY): Payer: Medicare Other

## 2021-10-08 ENCOUNTER — Ambulatory Visit: Payer: Medicare Other | Admitting: Physician Assistant

## 2021-10-08 ENCOUNTER — Other Ambulatory Visit (HOSPITAL_COMMUNITY): Payer: Self-pay

## 2021-10-08 DIAGNOSIS — H538 Other visual disturbances: Secondary | ICD-10-CM

## 2021-10-08 DIAGNOSIS — D72819 Decreased white blood cell count, unspecified: Secondary | ICD-10-CM

## 2021-10-08 LAB — CBC WITH DIFFERENTIAL/PLATELET
Abs Immature Granulocytes: 0.01 10*3/uL (ref 0.00–0.07)
Basophils Absolute: 0 10*3/uL (ref 0.0–0.1)
Basophils Relative: 0 %
Eosinophils Absolute: 0.1 10*3/uL (ref 0.0–0.5)
Eosinophils Relative: 4 %
HCT: 27.1 % — ABNORMAL LOW (ref 39.0–52.0)
Hemoglobin: 8.9 g/dL — ABNORMAL LOW (ref 13.0–17.0)
Immature Granulocytes: 1 %
Lymphocytes Relative: 21 %
Lymphs Abs: 0.3 10*3/uL — ABNORMAL LOW (ref 0.7–4.0)
MCH: 30.3 pg (ref 26.0–34.0)
MCHC: 32.8 g/dL (ref 30.0–36.0)
MCV: 92.2 fL (ref 80.0–100.0)
Monocytes Absolute: 0.2 10*3/uL (ref 0.1–1.0)
Monocytes Relative: 15 %
Neutro Abs: 0.8 10*3/uL — ABNORMAL LOW (ref 1.7–7.7)
Neutrophils Relative %: 59 %
Platelets: 64 10*3/uL — ABNORMAL LOW (ref 150–400)
RBC: 2.94 MIL/uL — ABNORMAL LOW (ref 4.22–5.81)
RDW: 13.9 % (ref 11.5–15.5)
WBC: 1.4 10*3/uL — CL (ref 4.0–10.5)
nRBC: 0 % (ref 0.0–0.2)

## 2021-10-08 LAB — CULTURE, BLOOD (ROUTINE X 2)
Culture: NO GROWTH
Culture: NO GROWTH
Special Requests: ADEQUATE
Special Requests: ADEQUATE

## 2021-10-08 LAB — CBC
HCT: 28.5 % — ABNORMAL LOW (ref 39.0–52.0)
Hemoglobin: 9.2 g/dL — ABNORMAL LOW (ref 13.0–17.0)
MCH: 30.1 pg (ref 26.0–34.0)
MCHC: 32.3 g/dL (ref 30.0–36.0)
MCV: 93.1 fL (ref 80.0–100.0)
Platelets: 71 10*3/uL — ABNORMAL LOW (ref 150–400)
RBC: 3.06 MIL/uL — ABNORMAL LOW (ref 4.22–5.81)
RDW: 14.1 % (ref 11.5–15.5)
WBC: 1.6 10*3/uL — ABNORMAL LOW (ref 4.0–10.5)
nRBC: 0 % (ref 0.0–0.2)

## 2021-10-08 LAB — BASIC METABOLIC PANEL
Anion gap: 4 — ABNORMAL LOW (ref 5–15)
BUN: 13 mg/dL (ref 8–23)
CO2: 27 mmol/L (ref 22–32)
Calcium: 9.1 mg/dL (ref 8.9–10.3)
Chloride: 105 mmol/L (ref 98–111)
Creatinine, Ser: 0.82 mg/dL (ref 0.61–1.24)
GFR, Estimated: >60 mL/min (ref >60)
Glucose, Bld: 107 mg/dL — ABNORMAL HIGH (ref 70–99)
Potassium: 4.4 mmol/L (ref 3.5–5.1)
Sodium: 136 mmol/L (ref 135–145)

## 2021-10-08 MED ORDER — RADIAPLEXRX EX GEL
Freq: Every day | CUTANEOUS | Status: DC
  Filled 2021-10-08: qty 170

## 2021-10-08 MED ORDER — GADOBUTROL 1 MMOL/ML IV SOLN
7.0000 mL | Freq: Once | INTRAVENOUS | Status: AC | PRN
  Administered 2021-10-08: 7 mL via INTRAVENOUS

## 2021-10-08 MED ORDER — TBO-FILGRASTIM 300 MCG/0.5ML ~~LOC~~ SOSY
300.0000 ug | PREFILLED_SYRINGE | Freq: Once | SUBCUTANEOUS | Status: AC
Start: 2021-10-08 — End: 2021-10-08
  Administered 2021-10-08: 300 ug via SUBCUTANEOUS
  Filled 2021-10-08: qty 0.5

## 2021-10-08 NOTE — Assessment & Plan Note (Signed)
He report Blurry vision this am. Associated with headache, back pain.  MRI brain showed two larger lesion, discussed with Dr [name] this are expected  changes from recent radiation treatment.  Patient blurry vision resolved. He was advised to follow with Ophthalmology.

## 2021-10-08 NOTE — Discharge Summary (Signed)
Physician Discharge Summary   Patient: [name] MRN: [id number] DOB: 13-Sep-1955  Admit date:     10/02/2021  Discharge date: 10/08/21  Discharge Physician: Elmarie Shiley   PCP: [name], MD   Recommendations at discharge:   Needs to follow up with Dr Mohamed/ and Dr [name] for further care neuroendocrine carcinoma.   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  Started on immunotherapy couple of days prior to admission, history of paroxysmal A. fib, CAD presenting to the ED with uncontrolled abdominal and back pain which prior to admission had recently been controlled by oral medications recently adjusted in the outpatient setting by palliative care.  Patient admitted for pain management.  Palliative care consulted for symptom management.  Assessment and Plan: * Cancer-related breakthrough pain- (present on admission) Pt presented with uncontrolled pain from metastatic cancer despite home pain regimen since starting immunotherapy a couple of days ago. -CT shows no acute intra-abdominal process. -Suspect cancer related pain. - Continue MS contin TID - Dilaudid dose of 4-6 mg every 4 hours as needed as per palliative care on 2/07. -Continue home neurontin and flexeril, Cymbalta. -Pain better controlled this afternoon.   Leukopenia He is  afebrile. Will discussed with Dr [name].   Blurry vision, bilateral He report Blurry vision this am. Associated with headache, back pain.  MRI brain showed two larger lesion, discussed with Dr [name] this are expected  changes from recent radiation treatment.  Patient blurry vision resolved. He was advised to follow with Ophthalmology.   Wound of back- (present on admission) -Patient seen by [name] RN and recommendations made for wound care. -Outpatient follow-up with oncologist.  Hyponatremia- (present on admission) - Likely secondary to dehydration and uncontrolled pain. -Improved with hydration. -Continue current pain management.  Protein-calorie malnutrition, moderate ([place])- (present on admission) - Secondary to metastatic cancer. -Continue nutritional supplementation.  AF (paroxysmal atrial fibrillation) ([place])- (present on admission) -Continue Toprol-XL.  -Continue with Xarelto.   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[place])- (present on admission) Lung primary with metastatic disease widespread including brain mets recently treated with rad therapy. Just started immunotherapy. -Patient's primary oncologist and neuro oncologist informed of admission. -Outpatient follow-up with oncology/neurooncology. -Palliative care following.  Gastroesophageal reflux disease- (present on admission) - Continue with PPI.   -GI cocktail as needed.  S/P CABG x 5 - Stable. -Continue home regimen Toprol-XL, Xarelto, Ranexa -Outpatient follow-up with cardiology.  Mixed bipolar I disorder ([place])- (present on admission) - Stable. -Continue home regimen Cymbalta.  Anxiety- (present on admission) - Continue with Cymbalta.           Consultants: Palliative Procedures performed: None Disposition: Home Diet recommendation:  Discharge Diet Orders (From admission, onward)     Start     Ordered   10/08/21 0000  Diet - low sodium heart healthy        10/08/21 1423    10/06/21 0000  Diet general        10/06/21 1803           Regular diet  DISCHARGE MEDICATION: Allergies as of 10/08/2021       Reactions   Prednisone Other (See Comments)   States that this med makes him "crazy"   Tetanus Toxoids Swelling, Other (See Comments)   Fever, Swelling of the arm    Wellbutrin [bupropion] Other (See Comments)   Crazy thoughts, nightmares   Varenicline Other (See Comments)   Unpleasant dreams        Medication List     TAKE these medications    acetaminophen 500 MG tablet Commonly known as: TYLENOL Take 1,000 mg by mouth every 6 (six) hours as needed for mild pain, fever or headache.   aspirin EC 81 MG tablet Take 81 mg by mouth daily. Swallow whole.   docusate sodium 100 MG capsule Commonly known as: COLACE Take 100-200 mg by mouth daily.   DULoxetine 30 MG capsule Commonly known as: CYMBALTA Take 1 capsule (30 mg total) by mouth 2 (two) times daily. What changed:  medication strength how much to take when to take this Another medication with the same name was removed. Continue taking this medication, and follow the directions you see here.   ferrous gluconate 324 MG tablet Commonly known as: FERGON Take 324 mg by mouth daily with breakfast.   folic acid 1 MG tablet Commonly known as: FOLVITE Take 1 tablet (1 mg total) by mouth daily.   gabapentin 300 MG capsule Commonly known as: NEURONTIN Take 1 capsule (300 mg total) by mouth 3 (three) times daily. What changed:  medication strength how much to take how to take this when to take this additional instructions   HYDROmorphone 4 MG tablet Commonly known as: DILAUDID Take 1 tablet (4 mg total) by mouth every 3 (three) hours as needed for severe pain. What changed: Another medication with the same name was added. Make sure you understand how and when to take each.   HYDROmorphone 2 MG tablet Commonly known as: Dilaudid Take 1-2 tablets (2-4 mg total) by mouth every 3  (three) hours as needed for severe pain (Take 1-2 tablet (2-4mg ) in addition to (1) 4mg  tablet to take a total of 6mg  every 3-4 hours for breakthrough pain). What changed: You were already taking a medication with the same name, and this prescription was added. Make sure you understand how and when to take each.   lidocaine 5 % Commonly known as: LIDODERM Place 1 patch onto the skin daily as needed (for pain- Remove & Discard patch within 12 hours or as directed by MD).   lidocaine-prilocaine cream Commonly known as: EMLA Apply to the Port-A-Cath site 30-60-minute before treatment What changed:  how much to take how to take this when to take this additional instructions   metoprolol succinate 25 MG 24 hr tablet Commonly known as: TOPROL-XL Take 1 tablet (25 mg total) by mouth daily. What changed: when to take this   midodrine 2.5 MG tablet Commonly known as: PROAMATINE Take 2.5 mg by mouth 3 (three) times daily as needed (AS DIRECTED).   morphine 60 MG 12 hr tablet Commonly known as: MS CONTIN Take 1 tablet (60 mg total) by mouth every 8 (eight) hours. **10/02/2021   multivitamin with minerals Tabs tablet Take 1 tablet by mouth daily.   ondansetron 8 MG tablet Commonly known as: ZOFRAN Take 1 tablet (8 mg total) by mouth every 8 (eight)  hours as needed for nausea or vomiting. Starting 3 days after chemotherapy   pantoprazole 40 MG tablet Commonly known as: PROTONIX Take 1 tablet by mouth daily at 6 AM.   polyethylene glycol 17 g packet Commonly known as: MIRALAX / GLYCOLAX Take 17 g by mouth daily.   prochlorperazine 10 MG tablet Commonly known as: COMPAZINE Take 1 tablet (10 mg total) by mouth every 6 (six) hours as needed for nausea or vomiting.   ranolazine 1000 MG SR tablet Commonly known as: RANEXA Take 1 tablet (1,000 mg total) by mouth 2 (two) times daily.   senna-docusate 8.6-50 MG tablet Commonly known as: Senokot-S Take 1 tablet by mouth 2 (two) times  daily.   tiZANidine 2 MG tablet Commonly known as: ZANAFLEX Take 1 tablet (2 mg total) by mouth at bedtime as needed for muscle spasms. What changed: when to take this   Xarelto 20 MG Tabs tablet Generic drug: rivaroxaban Take 20 mg by mouth every evening.               Discharge Care Instructions  (From admission, onward)           Start     Ordered   10/08/21 0000  Discharge wound care:       Comments: See above   10/08/21 1423   10/06/21 0000  Discharge wound care:       Comments: As above   10/06/21 1803             Discharge Exam: Filed Weights   10/02/21 1906  Weight: 77.1 kg   General; alert, follows command Lungs CTA Neuro exam; non focal.   Condition at discharge: stable  The results of significant diagnostics from this hospitalization (including imaging, microbiology, ancillary and laboratory) are listed below for reference.   Imaging Studies: DG Chest 2 View  Result Date: 09/14/2021 CLINICAL DATA:  Right lower abdominal pain with cough and nausea. EXAM: CHEST - 2 VIEW COMPARISON:  August 29, 2021 FINDINGS: There is stable right-sided venous Port-A-Cath positioning. Multiple sternal wires and vascular clips are seen. Mild atelectasis is noted within the right lung base. A small right pleural effusion is present. No pneumothorax is identified. The heart size and mediastinal contours are within normal limits. A radiopaque fusion plate and screws are seen overlying the lower cervical spine. The visualized skeletal structures are otherwise unremarkable. IMPRESSION: 1. Stable postoperative changes with mild right basilar atelectasis. 2. Small right pleural effusion. Electronically Signed   By: Virgina Norfolk M.D.   On: 09/14/2021 23:17   DG Ribs Unilateral W/Chest Right  Result Date: 09/16/2021 CLINICAL DATA:  Right posterior rib fracture, right flank pain, history of metastatic disease EXAM: RIGHT RIBS AND CHEST - 3+ VIEW COMPARISON:   09/15/2021, 09/14/2021 FINDINGS: Frontal view of the chest as well as frontal and oblique views of the right thoracic cage are obtained. Cardiac silhouette is stable. Postsurgical changes from median sternotomy. Right chest wall port unchanged. Small right pleural effusion.  No airspace disease or pneumothorax. Lytic lesion posterior right eleventh rib with associated pathologic fracture identified, better visualized on recent CT. The other smaller lytic lesions throughout the remainder of the thoracic cage are not as well visualized by radiograph. IMPRESSION: 1. Lytic lesion and pathologic fracture posterior right eleventh rib. 2. Small right pleural effusion. 3. Please refer to recent CT report describing numerous other small lytic lesions within the right thoracic cage. Electronically Signed   By: Diana Eves.D.  On: 09/16/2021 22:02   CT Angio Chest PE W and/or Wo Contrast  Result Date: 10/02/2021 CLINICAL DATA:  Lung cancer patient with bone metastasis. Generalized abdominal pain and low back pain status post commencement of immunotherapy. EXAM: CT ANGIOGRAPHY CHEST CT ABDOMEN AND PELVIS WITH CONTRAST TECHNIQUE: Multidetector CT imaging of the chest was performed using the standard protocol during bolus administration of intravenous contrast. Multiplanar CT image reconstructions and MIPs were obtained to evaluate the vascular anatomy. Multidetector CT imaging of the abdomen and pelvis was performed using the standard protocol during bolus administration of intravenous contrast. RADIATION DOSE REDUCTION: This exam was performed according to the departmental dose-optimization program which includes automated exposure control, adjustment of the mA and/or kV according to patient size and/or use of iterative reconstruction technique. CONTRAST:  137mL OMNIPAQUE IOHEXOL 350 MG/ML SOLN COMPARISON:  Chest CT angiogram dated 09/15/2021. CT abdomen and pelvis dated 09/15/2021. FINDINGS: CTA CHEST FINDINGS  Cardiovascular: There is no pulmonary embolism identified within the main, lobar or central segmental pulmonary arteries bilaterally. Some of the most peripheral pulmonary artery branches to each lung are difficult to definitively characterize due to mild patient breathing motion artifact. No pericardial effusion. Aortic atherosclerosis. No evidence of aortic dissection. Mediastinum/Nodes: No mass or enlarged lymph nodes are identified within the mediastinum or perihilar regions. Esophagus is unremarkable. Trachea is unremarkable. Surgical changes at the RIGHT hilum, compatible with a previous surgical procedure/resection. Lungs/Pleura: Bilateral pulmonary nodules are not significantly changed in the short-term interval, compared to most recent chest CT of 09/15/2021. No new nodules seen. Stable small RIGHT pleural effusion, with overlying mild atelectasis. No pneumothorax. Musculoskeletal: Again noted is destructive change within the posterior eleventh rib. Again noted are multiple soft tissue masses within the soft tissues of the lateral and posterior RIGHT chest wall, not significantly changed in appearance compared to the earlier study of 09/15/2021. Review of the MIP images confirms the above findings. CT ABDOMEN and PELVIS FINDINGS Hepatobiliary: No acute or suspicious findings within the liver. Tiny hypodensities again noted within the liver, too small to definitively characterize but most likely small cysts. Gallbladder is unremarkable. No bile duct dilatation seen. Pancreas: Unremarkable. No pancreatic ductal dilatation or surrounding inflammatory changes. Spleen: Normal in size without focal abnormality. Adrenals/Urinary Tract: Adrenal glands appear normal. LEFT renal cyst. Kidneys otherwise unremarkable without suspicious mass, stone or hydronephrosis. Bladder is decompressed. Stomach/Bowel: No dilated large or small bowel loops. No evidence of bowel wall inflammation. Appendix is normal. Stomach is  unremarkable, partially decompressed. Vascular/Lymphatic: Aortic atherosclerosis. No acute-appearing vascular abnormality. No enlarged lymph nodes are seen in the abdomen or pelvis. Reproductive: Prostate is unremarkable. Other: No free fluid or abscess collection is seen within the abdomen or pelvis. No free intraperitoneal air. Musculoskeletal: No acute or suspicious osseous abnormality within the abdomen or pelvis. Review of the MIP images confirms the above findings. IMPRESSION: 1. No new findings within the chest, abdomen or pelvis. No pulmonary embolism. No evidence of pneumonia or pulmonary edema. 2. Stable small RIGHT pleural effusion, with overlying mild atelectasis. 3. Bilateral pulmonary nodules and soft tissue masses within the soft tissues of the RIGHT chest wall, as described above, not significantly changed in the short-term interval compared to the earlier study of 09/15/2021. 4. Stable destructive change within the posterior eleventh rib. 5. No acute findings within the abdomen or pelvis. No evidence of metastatic disease within the abdomen or pelvis. Aortic Atherosclerosis (ICD10-I70.0). Electronically Signed   By: Franki Cabot M.D.   On: 10/02/2021 21:27  CT Angio Chest PE W/Cm &/Or Wo Cm  Result Date: 09/15/2021 CLINICAL DATA:  Dyspnea with right-sided abdominal pain and flank pain. EXAM: CT ANGIOGRAPHY CHEST WITH CONTRAST TECHNIQUE: Multidetector CT imaging of the chest was performed using the standard protocol during bolus administration of intravenous contrast. Multiplanar CT image reconstructions and MIPs were obtained to evaluate the vascular anatomy. RADIATION DOSE REDUCTION: This exam was performed according to the departmental dose-optimization program which includes automated exposure control, adjustment of the mA and/or kV according to patient size and/or use of iterative reconstruction technique. CONTRAST:  148mL OMNIPAQUE IOHEXOL 350 MG/ML SOLN COMPARISON:  August 18, 2021  FINDINGS: Cardiovascular: A right-sided venous Port-A-Cath is in place. There is mild to moderate severity calcification of the aortic arch. Satisfactory opacification of the pulmonary arteries to the segmental level. No evidence of pulmonary embolism. Normal heart size. No pericardial effusion. Mediastinum/Nodes: No enlarged mediastinal, hilar, or axillary lymph nodes. Thyroid gland, trachea, and esophagus demonstrate no significant findings. Lungs/Pleura: A 12 mm x 11 mm noncalcified lung nodule is seen within the posteromedial aspect of the left upper lobe (axial CT image 29, CT series 4). This measures approximately 11 mm x 9 mm on the prior study. 6 mm and 4 mm noncalcified lung nodules are seen within the posterior aspect of the right upper lobe (axial CT images 37 and 46, CT series 2). These are new findings when compared to the prior study. New 3 mm and 6 mm noncalcified lung nodules are seen within the posterolateral aspects of the right lower lobe (axial CT images 52, 59 and 66, CT series 4). Mild atelectatic changes are seen within the posterior aspect of the right lung base. A very small right pleural effusion is seen. No pneumothorax is identified. Upper Abdomen: A stable simple cyst is seen within the left kidney. Musculoskeletal: Multiple sternal wires are present. Lytic lesions are again seen involving multiple right ribs. A pathologic fracture of the posterior aspect of the eleventh right rib is again seen. There has been interval enlargement of the soft tissue lesions seen within the posterior right chest wall with pleural invasion along the posteromedial aspect of the right lung base. A metallic density fixation plate and screws are seen along the anterior aspect of the lower cervical spine. The level degenerative changes are seen throughout the thoracic spine. Review of the MIP images confirms the above findings. IMPRESSION: 1. No evidence for pulmonary embolism. 2. Very mild interval increase in  size of the previously noted left upper lobe lung nodule, with multiple new bilateral noncalcified lung nodules, consistent with pulmonary metastasis. 3. Very small right pleural effusion. 4. Interval enlargement of the soft tissue mass is seen within the posterior right chest wall with pleural invasion along right lung base. 5. Multiple right-sided lytic lesions with a pathologic fracture of the posterior eleventh right rib. Aortic Atherosclerosis (ICD10-I70.0). Electronically Signed   By: Virgina Norfolk M.D.   On: 09/15/2021 00:49   MR BRAIN W WO CONTRAST  Result Date: 10/08/2021 CLINICAL DATA:  Sudden onset severe headache. History of lung cancer with metastatic to brain. Post radiation and chemo. EXAM: MRI HEAD WITHOUT AND WITH CONTRAST TECHNIQUE: Multiplanar, multiecho pulse sequences of the brain and surrounding structures were obtained without and with intravenous contrast. CONTRAST:  46mL GADAVIST GADOBUTROL 1 MMOL/ML IV SOLN COMPARISON:  MRI head with contrast 08/14/2021 FINDINGS: Brain: Multiple small metastatic deposits in the brain. Mixed response to treatment with several lesions larger and most lesions smaller. Larger lesions:  Left cerebellar 4 mm lesion axial image 33 appears larger with new area of surrounding edema. Associated chronic hemorrhage has progressed. This is a treated lesion and enlargement could be related to tumor growth or treatment effect. Enhancing lesion in the left globus pallidus region measures 5 mm and is increased in size. Associated chronic hemorrhage is unchanged. No edema. Stable or smaller lesions: 3 mm lesion right posterior insula axial image 86 slightly smaller 2 mm lesion left parietal cortex axial image 128 smaller 3 mm lesion left frontal cortex axial image 129 is smaller 3 mm lesion right frontal cortex over the convexity is slightly smaller. Axial image 143 Ventricle size normal.  Negative for acute infarct. Vascular: Normal arterial flow voids Skull and upper  cervical spine: No suspicious skeletal lesion. Sinuses/Orbits: Paranasal sinuses clear.  Negative orbit Other: None IMPRESSION: Multiple small metastatic deposits to the brain. Mixed response with 2 lesions mildly larger and the remaining lesions stable or smaller. Electronically Signed   By: Franchot Gallo M.D.   On: 10/08/2021 12:27   CT ABDOMEN PELVIS W CONTRAST  Result Date: 10/02/2021 CLINICAL DATA:  Lung cancer patient with bone metastasis. Generalized abdominal pain and low back pain status post commencement of immunotherapy. EXAM: CT ANGIOGRAPHY CHEST CT ABDOMEN AND PELVIS WITH CONTRAST TECHNIQUE: Multidetector CT imaging of the chest was performed using the standard protocol during bolus administration of intravenous contrast. Multiplanar CT image reconstructions and MIPs were obtained to evaluate the vascular anatomy. Multidetector CT imaging of the abdomen and pelvis was performed using the standard protocol during bolus administration of intravenous contrast. RADIATION DOSE REDUCTION: This exam was performed according to the departmental dose-optimization program which includes automated exposure control, adjustment of the mA and/or kV according to patient size and/or use of iterative reconstruction technique. CONTRAST:  116mL OMNIPAQUE IOHEXOL 350 MG/ML SOLN COMPARISON:  Chest CT angiogram dated 09/15/2021. CT abdomen and pelvis dated 09/15/2021. FINDINGS: CTA CHEST FINDINGS Cardiovascular: There is no pulmonary embolism identified within the main, lobar or central segmental pulmonary arteries bilaterally. Some of the most peripheral pulmonary artery branches to each lung are difficult to definitively characterize due to mild patient breathing motion artifact. No pericardial effusion. Aortic atherosclerosis. No evidence of aortic dissection. Mediastinum/Nodes: No mass or enlarged lymph nodes are identified within the mediastinum or perihilar regions. Esophagus is unremarkable. Trachea is  unremarkable. Surgical changes at the RIGHT hilum, compatible with a previous surgical procedure/resection. Lungs/Pleura: Bilateral pulmonary nodules are not significantly changed in the short-term interval, compared to most recent chest CT of 09/15/2021. No new nodules seen. Stable small RIGHT pleural effusion, with overlying mild atelectasis. No pneumothorax. Musculoskeletal: Again noted is destructive change within the posterior eleventh rib. Again noted are multiple soft tissue masses within the soft tissues of the lateral and posterior RIGHT chest wall, not significantly changed in appearance compared to the earlier study of 09/15/2021. Review of the MIP images confirms the above findings. CT ABDOMEN and PELVIS FINDINGS Hepatobiliary: No acute or suspicious findings within the liver. Tiny hypodensities again noted within the liver, too small to definitively characterize but most likely small cysts. Gallbladder is unremarkable. No bile duct dilatation seen. Pancreas: Unremarkable. No pancreatic ductal dilatation or surrounding inflammatory changes. Spleen: Normal in size without focal abnormality. Adrenals/Urinary Tract: Adrenal glands appear normal. LEFT renal cyst. Kidneys otherwise unremarkable without suspicious mass, stone or hydronephrosis. Bladder is decompressed. Stomach/Bowel: No dilated large or small bowel loops. No evidence of bowel wall inflammation. Appendix is normal. Stomach is unremarkable, partially  decompressed. Vascular/Lymphatic: Aortic atherosclerosis. No acute-appearing vascular abnormality. No enlarged lymph nodes are seen in the abdomen or pelvis. Reproductive: Prostate is unremarkable. Other: No free fluid or abscess collection is seen within the abdomen or pelvis. No free intraperitoneal air. Musculoskeletal: No acute or suspicious osseous abnormality within the abdomen or pelvis. Review of the MIP images confirms the above findings. IMPRESSION: 1. No new findings within the chest,  abdomen or pelvis. No pulmonary embolism. No evidence of pneumonia or pulmonary edema. 2. Stable small RIGHT pleural effusion, with overlying mild atelectasis. 3. Bilateral pulmonary nodules and soft tissue masses within the soft tissues of the RIGHT chest wall, as described above, not significantly changed in the short-term interval compared to the earlier study of 09/15/2021. 4. Stable destructive change within the posterior eleventh rib. 5. No acute findings within the abdomen or pelvis. No evidence of metastatic disease within the abdomen or pelvis. Aortic Atherosclerosis (ICD10-I70.0). Electronically Signed   By: Franki Cabot M.D.   On: 10/02/2021 21:27   CT Abdomen Pelvis W Contrast  Result Date: 09/15/2021 CLINICAL DATA:  Right-sided abdominal pain and flank pain. EXAM: CT ABDOMEN AND PELVIS WITH CONTRAST TECHNIQUE: Multidetector CT imaging of the abdomen and pelvis was performed using the standard protocol following bolus administration of intravenous contrast. RADIATION DOSE REDUCTION: This exam was performed according to the departmental dose-optimization program which includes automated exposure control, adjustment of the mA and/or kV according to patient size and/or use of iterative reconstruction technique. CONTRAST:  159mL OMNIPAQUE IOHEXOL 350 MG/ML SOLN COMPARISON:  May 05, 2021 FINDINGS: Lower chest: Multiple subcentimeter noncalcified lung nodules are seen within the posterolateral aspect of the left lung base. Mild posterior right basilar scarring and/or atelectasis is noted. A small right pleural effusion is seen. Hepatobiliary: Punctate foci of parenchymal low attenuation are seen within the anterior aspect of the liver dome and inferior aspect of the right lobe of the liver (axial CT images 10 and 32, CT series 5). These are too small to characterize by CT examination. No gallstones, gallbladder wall thickening, or biliary dilatation. Pancreas: Unremarkable. No pancreatic ductal  dilatation or surrounding inflammatory changes. Spleen: Normal in size without focal abnormality. Adrenals/Urinary Tract: Adrenal glands are unremarkable. Kidneys are normal in size, without renal calculi or hydronephrosis. A stable simple cyst is seen within the anterior aspect of the mid left kidney. Bladder is unremarkable. Stomach/Bowel: Stomach is within normal limits. Appendix appears normal. No evidence of bowel wall thickening, distention, or inflammatory changes. Noninflamed diverticula are seen within the sigmoid colon. Vascular/Lymphatic: Aortic atherosclerosis. No enlarged abdominal or pelvic lymph nodes. Reproductive: The prostate gland is mildly enlarged. Other: No abdominal wall hernia or abnormality. No abdominopelvic ascites. Musculoskeletal: Multiple soft tissue masses are seen within the musculature and subcutaneous fat of the lateral and posterior aspects of the lower right chest wall. Pleural invasion is noted within the posterior aspect of the right lung base. These soft tissue masses extend along the posterior wall of the mid and upper right abdomen. This represents a new finding. Lytic lesions are seen involving multiple right ribs with a pathologic fracture noted involving the posterior eleventh right rib. Degenerative changes are seen within the lumbar spine. IMPRESSION: 1. Multiple neoplastic soft tissue masses within the lateral and posterior aspects of the lower right chest wall with associated pleural invasion. 2. Lytic lesions involving multiple right ribs with a pathologic fracture of the posterior eleventh right rib. 3. Multiple subcentimeter noncalcified lung nodules within the left lung base, consistent  with pulmonary metastatic disease. 4. Sigmoid diverticulosis. 5. Small right pleural effusion. 6. Aortic atherosclerosis. Aortic Atherosclerosis (ICD10-I70.0). Electronically Signed   By: Virgina Norfolk M.D.   On: 09/15/2021 00:57   DG Chest Portable 1 View  Result Date:  10/02/2021 CLINICAL DATA:  Metastatic lung cancer being treated with immunotherapy. Generalized abdominal pain and back pain. EXAM: PORTABLE CHEST 1 VIEW COMPARISON:  09/16/2021 FINDINGS: Previous median sternotomy and CABG. Power port in place on the right with the tip at the SVC RA junction. Previous lobectomy on the right. The lungs are presently clear. No bone lesion visible on the frontal chest radiography. Known right eleventh rib lesion not visible using this technique. Aortic atherosclerotic calcification incidentally noted. IMPRESSION: Previous lobectomy on the right.  No active disease. Electronically Signed   By: Nelson Chimes M.D.   On: 10/02/2021 19:49    Microbiology: Results for orders placed or performed during the hospital encounter of 10/02/21  Resp Panel by RT-PCR (Flu A&B, Covid) Nasopharyngeal Swab     Status: None   Collection Time: 10/03/21  1:10 AM   Specimen: Nasopharyngeal Swab; Nasopharyngeal(NP) swabs in vial transport medium  Result Value Ref Range Status   SARS Coronavirus 2 by RT PCR NEGATIVE NEGATIVE Final    Comment: (NOTE) SARS-CoV-2 target nucleic acids are NOT DETECTED.  The SARS-CoV-2 RNA is generally detectable in upper respiratory specimens during the acute phase of infection. The lowest concentration of SARS-CoV-2 viral copies this assay can detect is 138 copies/mL. A negative result does not preclude SARS-Cov-2 infection and should not be used as the sole basis for treatment or other patient management decisions. A negative result may occur with  improper specimen collection/handling, submission of specimen other than nasopharyngeal swab, presence of viral mutation(s) within the areas targeted by this assay, and inadequate number of viral copies(<138 copies/mL). A negative result must be combined with clinical observations, patient history, and epidemiological information. The expected result is Negative.  Fact Sheet for Patients:   EntrepreneurPulse.com.au  Fact Sheet for Healthcare Providers:  IncredibleEmployment.be  This test is no t yet approved or cleared by the [place] FDA and  has been authorized for detection and/or diagnosis of SARS-CoV-2 by FDA under an Emergency Use Authorization (EUA). This EUA will remain  in effect (meaning this test can be used) for the duration of the COVID-19 declaration under Section 564(b)(1) of the Act, 21 U.S.C.section 360bbb-3(b)(1), unless the authorization is terminated  or revoked sooner.       Influenza A by PCR NEGATIVE NEGATIVE Final   Influenza B by PCR NEGATIVE NEGATIVE Final    Comment: (NOTE) The Xpert Xpress SARS-CoV-2/FLU/RSV plus assay is intended as an aid in the diagnosis of influenza from Nasopharyngeal swab specimens and should not be used as a sole basis for treatment. Nasal washings and aspirates are unacceptable for Xpert Xpress SARS-CoV-2/FLU/RSV testing.  Fact Sheet for Patients: EntrepreneurPulse.com.au  Fact Sheet for Healthcare Providers: IncredibleEmployment.be  This test is not yet approved or cleared by the [place] FDA and has been authorized for detection and/or diagnosis of SARS-CoV-2 by FDA under an Emergency Use Authorization (EUA). This EUA will remain in effect (meaning this test can be used) for the duration of the COVID-19 declaration under Section 564(b)(1) of the Act, 21 U.S.C. section 360bbb-3(b)(1), unless the authorization is terminated or revoked.  Performed at [facility], [place] [address], [place], [place] [address]   Urine Culture     Status: Abnormal  Collection Time: 10/03/21  4:22 PM   Specimen: Urine, Clean Catch  Result Value Ref Range Status   Specimen Description   Final    URINE, CLEAN CATCH Performed at [facility], [place] [address] [place], [place] [address]    Special Requests    Final    NONE Performed at [facility], [place] [address], [place], [place] [address]    Culture MULTIPLE SPECIES PRESENT, SUGGEST RECOLLECTION (A)  Final   Report Status 10/05/2021 FINAL  Final  Culture, blood (Routine X 2) w Reflex to ID Panel     Status: None   Collection Time: 10/03/21  4:40 PM   Specimen: BLOOD  Result Value Ref Range Status   Specimen Description   Final    BLOOD RIGHT ANTECUBITAL Performed at [place] [address], [place], [place] [address]    Special Requests   Final    BOTTLES DRAWN AEROBIC ONLY Blood Culture adequate volume Performed at [place] [address], [place], [place] [address]    Culture   Final    NO GROWTH 5 DAYS Performed at [facility], [place] [address], [place], [place] [address]    Report Status 10/08/2021 FINAL  Final  Culture, blood (Routine X 2) w Reflex to ID Panel     Status: None   Collection Time: 10/03/21  4:40 PM   Specimen: BLOOD  Result Value Ref Range Status   Specimen Description   Final    BLOOD BLOOD RIGHT ARM Performed at [place] [address] [place], [place] [address]    Special Requests   Final    BOTTLES DRAWN AEROBIC ONLY Blood Culture adequate volume Performed at [place] [address], [place], [place] [address]    Culture   Final    NO GROWTH 5 DAYS Performed at [facility], [place] [address], [place], [place] [address]    Report Status 10/08/2021 FINAL  Final   *Note: Due to a large number of results and/or encounters for the requested time period, some results have not been displayed. A complete set of results can be found in Results Review.    Labs: CBC: Recent Labs  Lab 10/02/21 2004 10/03/21 0443 10/04/21 0658 10/08/21 0921  WBC 4.7 4.7 2.9* 1.6*  NEUTROABS 4.3  --   --   --   HGB 11.3* 12.0* 10.9* 9.2*  HCT 34.1* 36.9* 34.0* 28.5*  MCV 90.9 91.3 92.9 93.1  PLT  159 154 128* 71*   Basic Metabolic Panel: Recent Labs  Lab 10/02/21 2004 10/03/21 0443 10/04/21 0658 10/06/21 0534 10/06/21 0907 10/08/21 0921  NA 133* 129* 130* 138  --  136  K 3.6 3.6 4.0 5.3* 4.0 4.4  CL 99 96* 97* 108  --  105  CO2 27 26 26 25   --  27  GLUCOSE 94 98 118* 97  --  107*  BUN 20 19 24* 25*  --  13  CREATININE 0.96 1.05 1.05 0.83  --  0.82  CALCIUM 9.8 9.6 9.2 9.3  --  9.1  MG  --   --   --  1.9  --   --    Liver Function Tests: Recent Labs  Lab 10/02/21 2004  AST 22  ALT 18  ALKPHOS 72  BILITOT 0.8  PROT 7.1  ALBUMIN 3.8   CBG: No results for input(s): GLUCAP in the last  168 hours.  Discharge time spent: greater than 30 minutes.  Signed: Elmarie Shiley, MD [facility] 10/08/2021

## 2021-10-08 NOTE — TOC Progression Note (Signed)
Transition of Care [facility]) - Progression Note    Patient Details  Name: [name] MRN: [id number] Date of Birth: [birth year]  [facility] CM/SW Contact  Purcell Mouton, RN Phone Number: 10/08/2021, 9:57 AM  Clinical Narrative:    Pt will follow up at [facility] for Palliative Care. TOC will sign off.         Expected Discharge Plan and Services                                                 Social Determinants of Health (SDOH) Interventions    Readmission Risk Interventions Readmission Risk Prevention Plan 08/21/2021 08/01/2021 03/25/2021  Transportation Screening Complete Complete Complete  Medication Review Press photographer) Complete Complete Complete  PCP or Specialist appointment within 3-5 days of discharge Complete Complete Complete  HRI or Home Care Consult Complete Complete Complete  SW Recovery Care/Counseling Consult Complete Complete Complete  SW Consult Not Complete Comments n/a - -  Palliative Care Screening Not Applicable Complete Not [place] Not Applicable Not Applicable Not Applicable  Some recent data might be hidden

## 2021-10-08 NOTE — Assessment & Plan Note (Signed)
He is afebrile. Will discussed with Dr [name].

## 2021-10-08 NOTE — Consult Note (Addendum)
WOC consult requested for radiation burn on back.  This was already performed on 2/4 By Maudie Flakes; please refer to previous consult notes for assessment, and topical treatment orders have been provided for bedside nurses to perform. Pt should follow-up with his oncologist after discharge for further plan of care.  Please re-consult if further assistance is needed.   Thank-you,  Julien Girt MSN, [name], [place], [place], [place]

## 2021-10-08 NOTE — Significant Event (Signed)
Rapid Response Event Note   Reason for Call :  Visual acuity changes and patient reports feeling weird.  Initial Focused Assessment:  Upon arrival, patient looking at his phone and trying to read small print. Patient states that words appear blurry and this has occurred in past. Patient states "it comes and goes, it's difficult to describe, it just feels different than before." Patient's vitals consistent with previous readings. Patient alert, oriented, following commands, and able to express needs/thoughts. No unilateral deformities or changes in face or body, strength equal bilaterally in all four extremities. No unmanaged pain or severe distress reported by patient although he states he feels "short of breath without actually being short of breath". Patient endorses anxiety, but doesn't have anything ordered for symptom.   Interventions:  Assessment and review of chart. Discussed with on call provider via secure chat and unit staff at bedside.    Plan of Care:  Attending team to re-evaluate this morning.    Event Summary:   MD Notified: Gershon Cull, NP Call Time: [phone] Arrival Time: 0620 End Time: [place], RN

## 2021-10-09 ENCOUNTER — Telehealth: Payer: Self-pay

## 2021-10-09 ENCOUNTER — Other Ambulatory Visit: Payer: Self-pay | Admitting: Nurse Practitioner

## 2021-10-09 ENCOUNTER — Other Ambulatory Visit (HOSPITAL_COMMUNITY): Payer: Self-pay

## 2021-10-09 MED ORDER — DULOXETINE HCL 30 MG PO CPEP: 30.0000 mg | ORAL_CAPSULE | Freq: Two times a day (BID) | ORAL | 1 refills | Status: DC

## 2021-10-09 MED ORDER — HYDROMORPHONE HCL 4 MG PO TABS: 4.0000 mg | ORAL_TABLET | ORAL | 0 refills | Status: DC | PRN

## 2021-10-09 NOTE — Progress Notes (Signed)
° ° °  Chart reviewed. Patient assessed at bedside.   Mr. Serano is awake and alert in bed. Pain is much improved with previous adjustments. Does complain of some discomfort to his back area. Assessed and appears to have radiation burn area. Skin intact, darkened with some irritation.   He had rapid response episode this morning with some blurred vision although he does state he was reading fine print. No loss of consciousness, remained alert and oriented. No other neurological changes. Reports vision is now improved.   Discussed pain regimen at length with awareness he will continue at discharge. All questions answered.   Assessment NAD, AAO x4 RRR Clear bilaterally Radiation burn to right lower back area  Plan -Discharge home when medically optimized -Will plan follow-up for ongoing symptom management at [facility] in 1-2 weeks.   Neoplasm related pain MS Contin 60 mg every 8hrs  Dilaudid increased to 8 mg every 3hours for breakthrough pain. Patient has Dilaudid 4mg  prescription in the home. Will double dose. No prescription needed at discharge.  Gabapentin 300 mg TID and 300mg  (increased on 2/7) Cymbalta 30 mg BID (increased on 2/7) Tizandidine 2mg  QHS as needed for muscle spasms Given he is taking immunotherapy unable to utilize steroids.  2. Constipation  Miralax daily  Senna-S twice daily Dulcolax as needed  3. Nausea  Compazine 10 mg as needed Zofran 4mg  as needed         Time Total: 35 min.  Visit consisted of counseling and education dealing with the complex and emotionally intense issues of symptom management and palliative care in the setting of serious and potentially life-threatening illness.Greater than 50%  of this time was spent counseling and coordinating care related to the above assessment and plan.  Alda Lea, AGPCNP-BC  Palliative Medicine Team [phone]

## 2021-10-09 NOTE — Telephone Encounter (Signed)
Returned pt call regarding Dilaudid and Cymbalta prescriptions. Aaron Edelman at [facility] confirmed that patient's wife had picked up Dilaudid 4 mg (120 ct) prescription on 10/02/21. Cymbalta will be called into [facility] in [place] by Jobe Gibbon NP today. Reviewed dosing information for prescriptions. Patient voiced understanding.

## 2021-10-10 ENCOUNTER — Ambulatory Visit (HOSPITAL_COMMUNITY)
Admission: RE | Admit: 2021-10-10 | Discharge: 2021-10-10 | Disposition: A | Discharge status: Home / Self Care | Payer: Medicare Other | Source: Ambulatory Visit | Attending: Internal Medicine | Admitting: Internal Medicine

## 2021-10-10 DIAGNOSIS — G587 Mononeuritis multiplex: Secondary | ICD-10-CM | POA: Diagnosis present

## 2021-10-10 DIAGNOSIS — C7931 Secondary malignant neoplasm of brain: Secondary | ICD-10-CM | POA: Insufficient documentation

## 2021-10-10 MED ORDER — GADOBUTROL 1 MMOL/ML IV SOLN
8.0000 mL | Freq: Once | INTRAVENOUS | Status: AC | PRN
  Administered 2021-10-10: 8 mL via INTRAVENOUS

## 2021-10-12 ENCOUNTER — Other Ambulatory Visit: Payer: Self-pay | Admitting: Radiation Therapy

## 2021-10-12 ENCOUNTER — Inpatient Hospital Stay: Payer: Medicare Other

## 2021-10-12 ENCOUNTER — Inpatient Hospital Stay (HOSPITAL_BASED_OUTPATIENT_CLINIC_OR_DEPARTMENT_OTHER): Payer: Medicare Other | Admitting: Internal Medicine

## 2021-10-12 DIAGNOSIS — C7931 Secondary malignant neoplasm of brain: Secondary | ICD-10-CM

## 2021-10-12 NOTE — Progress Notes (Signed)
I connected with [name] on 10/12/21 at 10:00 AM EST by telephone visit and verified that I am speaking with the correct person using two identifiers.  I discussed the limitations, risks, security and privacy concerns of performing an evaluation and management service by telemedicine and the availability of in-person appointments. I also discussed with the patient that there may be a patient responsible charge related to this service. The patient expressed understanding and agreed to proceed.  Other persons participating in the visit and their role in the encounter:  n/a  Patient's location:  Home  Provider's location:  Office  Chief Complaint:  Secondary malignant neoplasm of brain [facility])  History of Present Ilness: [name] describes modest improvement in numbness and tingling symptoms described at prior visit.  They are still present occasionally, but improved overall.  He denies any other new or progressive symptoms, no seizures or headaches.  First chemo infusion was 09/29/21.  Did have recent admission for cancer related pain (abdominal). Observations: Language and cognition at baseline  Imaging:  [place] [facility] Interpretation: I have personally reviewed the CNS images as listed.  My interpretation, in the context of the patient's clinical presentation, is progressive disease  DG Chest 2 View  Result Date: 09/14/2021 CLINICAL DATA:  Right lower abdominal pain with cough and nausea. EXAM: CHEST - 2 VIEW COMPARISON:  August 29, 2021 FINDINGS: There is stable right-sided venous Port-A-Cath positioning. Multiple sternal wires and vascular clips are seen. Mild atelectasis is noted within the right lung base. A small right pleural effusion is present. No pneumothorax is identified. The heart size and mediastinal contours are within normal limits. A radiopaque fusion plate and screws are seen overlying the lower cervical spine. The visualized skeletal structures are otherwise  unremarkable. IMPRESSION: 1. Stable postoperative changes with mild right basilar atelectasis. 2. Small right pleural effusion. Electronically Signed   By: Virgina Norfolk M.D.   On: 09/14/2021 23:17   DG Ribs Unilateral W/Chest Right  Result Date: 09/16/2021 CLINICAL DATA:  Right posterior rib fracture, right flank pain, history of metastatic disease EXAM: RIGHT RIBS AND CHEST - 3+ VIEW COMPARISON:  09/15/2021, 09/14/2021 FINDINGS: Frontal view of the chest as well as frontal and oblique views of the right thoracic cage are obtained. Cardiac silhouette is stable. Postsurgical changes from median sternotomy. Right chest wall port unchanged. Small right pleural effusion.  No airspace disease or pneumothorax. Lytic lesion posterior right eleventh rib with associated pathologic fracture identified, better visualized on recent CT. The other smaller lytic lesions throughout the remainder of the thoracic cage are not as well visualized by radiograph. IMPRESSION: 1. Lytic lesion and pathologic fracture posterior right eleventh rib. 2. Small right pleural effusion. 3. Please refer to recent CT report describing numerous other small lytic lesions within the right thoracic cage. Electronically Signed   By: Randa Ngo M.D.   On: 09/16/2021 22:02   CT Angio Chest PE W and/or Wo Contrast  Result Date: 10/02/2021 CLINICAL DATA:  Lung cancer patient with bone metastasis. Generalized abdominal pain and low back pain status post commencement of immunotherapy. EXAM: CT ANGIOGRAPHY CHEST CT ABDOMEN AND PELVIS WITH CONTRAST TECHNIQUE: Multidetector CT imaging of the chest was performed using the standard protocol during bolus administration of intravenous contrast. Multiplanar CT image reconstructions and MIPs were obtained to evaluate the vascular anatomy. Multidetector CT imaging of the abdomen and pelvis was performed using the standard protocol during bolus administration of intravenous contrast. RADIATION DOSE  REDUCTION: This exam  was performed according to the departmental dose-optimization program which includes automated exposure control, adjustment of the mA and/or kV according to patient size and/or use of iterative reconstruction technique. CONTRAST:  170mL OMNIPAQUE IOHEXOL 350 MG/ML SOLN COMPARISON:  Chest CT angiogram dated 09/15/2021. CT abdomen and pelvis dated 09/15/2021. FINDINGS: CTA CHEST FINDINGS Cardiovascular: There is no pulmonary embolism identified within the main, lobar or central segmental pulmonary arteries bilaterally. Some of the most peripheral pulmonary artery branches to each lung are difficult to definitively characterize due to mild patient breathing motion artifact. No pericardial effusion. Aortic atherosclerosis. No evidence of aortic dissection. Mediastinum/Nodes: No mass or enlarged lymph nodes are identified within the mediastinum or perihilar regions. Esophagus is unremarkable. Trachea is unremarkable. Surgical changes at the RIGHT hilum, compatible with a previous surgical procedure/resection. Lungs/Pleura: Bilateral pulmonary nodules are not significantly changed in the short-term interval, compared to most recent chest CT of 09/15/2021. No new nodules seen. Stable small RIGHT pleural effusion, with overlying mild atelectasis. No pneumothorax. Musculoskeletal: Again noted is destructive change within the posterior eleventh rib. Again noted are multiple soft tissue masses within the soft tissues of the lateral and posterior RIGHT chest wall, not significantly changed in appearance compared to the earlier study of 09/15/2021. Review of the MIP images confirms the above findings. CT ABDOMEN and PELVIS FINDINGS Hepatobiliary: No acute or suspicious findings within the liver. Tiny hypodensities again noted within the liver, too small to definitively characterize but most likely small cysts. Gallbladder is unremarkable. No bile duct dilatation seen. Pancreas: Unremarkable. No pancreatic  ductal dilatation or surrounding inflammatory changes. Spleen: Normal in size without focal abnormality. Adrenals/Urinary Tract: Adrenal glands appear normal. LEFT renal cyst. Kidneys otherwise unremarkable without suspicious mass, stone or hydronephrosis. Bladder is decompressed. Stomach/Bowel: No dilated large or small bowel loops. No evidence of bowel wall inflammation. Appendix is normal. Stomach is unremarkable, partially decompressed. Vascular/Lymphatic: Aortic atherosclerosis. No acute-appearing vascular abnormality. No enlarged lymph nodes are seen in the abdomen or pelvis. Reproductive: Prostate is unremarkable. Other: No free fluid or abscess collection is seen within the abdomen or pelvis. No free intraperitoneal air. Musculoskeletal: No acute or suspicious osseous abnormality within the abdomen or pelvis. Review of the MIP images confirms the above findings. IMPRESSION: 1. No new findings within the chest, abdomen or pelvis. No pulmonary embolism. No evidence of pneumonia or pulmonary edema. 2. Stable small RIGHT pleural effusion, with overlying mild atelectasis. 3. Bilateral pulmonary nodules and soft tissue masses within the soft tissues of the RIGHT chest wall, as described above, not significantly changed in the short-term interval compared to the earlier study of 09/15/2021. 4. Stable destructive change within the posterior eleventh rib. 5. No acute findings within the abdomen or pelvis. No evidence of metastatic disease within the abdomen or pelvis. Aortic Atherosclerosis (ICD10-I70.0). Electronically Signed   By: Franki Cabot M.D.   On: 10/02/2021 21:27   CT Angio Chest PE W/Cm &/Or Wo Cm  Result Date: 09/15/2021 CLINICAL DATA:  Dyspnea with right-sided abdominal pain and flank pain. EXAM: CT ANGIOGRAPHY CHEST WITH CONTRAST TECHNIQUE: Multidetector CT imaging of the chest was performed using the standard protocol during bolus administration of intravenous contrast. Multiplanar CT image  reconstructions and MIPs were obtained to evaluate the vascular anatomy. RADIATION DOSE REDUCTION: This exam was performed according to the departmental dose-optimization program which includes automated exposure control, adjustment of the mA and/or kV according to patient size and/or use of iterative reconstruction technique. CONTRAST:  155mL OMNIPAQUE IOHEXOL 350 MG/ML SOLN  COMPARISON:  August 18, 2021 FINDINGS: Cardiovascular: A right-sided venous Port-A-Cath is in place. There is mild to moderate severity calcification of the aortic arch. Satisfactory opacification of the pulmonary arteries to the segmental level. No evidence of pulmonary embolism. Normal heart size. No pericardial effusion. Mediastinum/Nodes: No enlarged mediastinal, hilar, or axillary lymph nodes. Thyroid gland, trachea, and esophagus demonstrate no significant findings. Lungs/Pleura: A 12 mm x 11 mm noncalcified lung nodule is seen within the posteromedial aspect of the left upper lobe (axial CT image 29, CT series 4). This measures approximately 11 mm x 9 mm on the prior study. 6 mm and 4 mm noncalcified lung nodules are seen within the posterior aspect of the right upper lobe (axial CT images 37 and 46, CT series 2). These are new findings when compared to the prior study. New 3 mm and 6 mm noncalcified lung nodules are seen within the posterolateral aspects of the right lower lobe (axial CT images 52, 59 and 66, CT series 4). Mild atelectatic changes are seen within the posterior aspect of the right lung base. A very small right pleural effusion is seen. No pneumothorax is identified. Upper Abdomen: A stable simple cyst is seen within the left kidney. Musculoskeletal: Multiple sternal wires are present. Lytic lesions are again seen involving multiple right ribs. A pathologic fracture of the posterior aspect of the eleventh right rib is again seen. There has been interval enlargement of the soft tissue lesions seen within the posterior  right chest wall with pleural invasion along the posteromedial aspect of the right lung base. A metallic density fixation plate and screws are seen along the anterior aspect of the lower cervical spine. The level degenerative changes are seen throughout the thoracic spine. Review of the MIP images confirms the above findings. IMPRESSION: 1. No evidence for pulmonary embolism. 2. Very mild interval increase in size of the previously noted left upper lobe lung nodule, with multiple new bilateral noncalcified lung nodules, consistent with pulmonary metastasis. 3. Very small right pleural effusion. 4. Interval enlargement of the soft tissue mass is seen within the posterior right chest wall with pleural invasion along right lung base. 5. Multiple right-sided lytic lesions with a pathologic fracture of the posterior eleventh right rib. Aortic Atherosclerosis (ICD10-I70.0). Electronically Signed   By: Virgina Norfolk M.D.   On: 09/15/2021 00:49   MR BRAIN W WO CONTRAST  Result Date: 10/08/2021 CLINICAL DATA:  Sudden onset severe headache. History of lung cancer with metastatic to brain. Post radiation and chemo. EXAM: MRI HEAD WITHOUT AND WITH CONTRAST TECHNIQUE: Multiplanar, multiecho pulse sequences of the brain and surrounding structures were obtained without and with intravenous contrast. CONTRAST:  76mL GADAVIST GADOBUTROL 1 MMOL/ML IV SOLN COMPARISON:  MRI head with contrast 08/14/2021 FINDINGS: Brain: Multiple small metastatic deposits in the brain. Mixed response to treatment with several lesions larger and most lesions smaller. Larger lesions: Left cerebellar 4 mm lesion axial image 33 appears larger with new area of surrounding edema. Associated chronic hemorrhage has progressed. This is a treated lesion and enlargement could be related to tumor growth or treatment effect. Enhancing lesion in the left globus pallidus region measures 5 mm and is increased in size. Associated chronic hemorrhage is unchanged.  No edema. Stable or smaller lesions: 3 mm lesion right posterior insula axial image 86 slightly smaller 2 mm lesion left parietal cortex axial image 128 smaller 3 mm lesion left frontal cortex axial image 129 is smaller 3 mm lesion right frontal cortex over  the convexity is slightly smaller. Axial image 143 Ventricle size normal.  Negative for acute infarct. Vascular: Normal arterial flow voids Skull and upper cervical spine: No suspicious skeletal lesion. Sinuses/Orbits: Paranasal sinuses clear.  Negative orbit Other: None IMPRESSION: Multiple small metastatic deposits to the brain. Mixed response with 2 lesions mildly larger and the remaining lesions stable or smaller. Electronically Signed   By: Franchot Gallo M.D.   On: 10/08/2021 12:27   CT ABDOMEN PELVIS W CONTRAST  Result Date: 10/02/2021 CLINICAL DATA:  Lung cancer patient with bone metastasis. Generalized abdominal pain and low back pain status post commencement of immunotherapy. EXAM: CT ANGIOGRAPHY CHEST CT ABDOMEN AND PELVIS WITH CONTRAST TECHNIQUE: Multidetector CT imaging of the chest was performed using the standard protocol during bolus administration of intravenous contrast. Multiplanar CT image reconstructions and MIPs were obtained to evaluate the vascular anatomy. Multidetector CT imaging of the abdomen and pelvis was performed using the standard protocol during bolus administration of intravenous contrast. RADIATION DOSE REDUCTION: This exam was performed according to the departmental dose-optimization program which includes automated exposure control, adjustment of the mA and/or kV according to patient size and/or use of iterative reconstruction technique. CONTRAST:  155mL OMNIPAQUE IOHEXOL 350 MG/ML SOLN COMPARISON:  Chest CT angiogram dated 09/15/2021. CT abdomen and pelvis dated 09/15/2021. FINDINGS: CTA CHEST FINDINGS Cardiovascular: There is no pulmonary embolism identified within the main, lobar or central segmental pulmonary arteries  bilaterally. Some of the most peripheral pulmonary artery branches to each lung are difficult to definitively characterize due to mild patient breathing motion artifact. No pericardial effusion. Aortic atherosclerosis. No evidence of aortic dissection. Mediastinum/Nodes: No mass or enlarged lymph nodes are identified within the mediastinum or perihilar regions. Esophagus is unremarkable. Trachea is unremarkable. Surgical changes at the RIGHT hilum, compatible with a previous surgical procedure/resection. Lungs/Pleura: Bilateral pulmonary nodules are not significantly changed in the short-term interval, compared to most recent chest CT of 09/15/2021. No new nodules seen. Stable small RIGHT pleural effusion, with overlying mild atelectasis. No pneumothorax. Musculoskeletal: Again noted is destructive change within the posterior eleventh rib. Again noted are multiple soft tissue masses within the soft tissues of the lateral and posterior RIGHT chest wall, not significantly changed in appearance compared to the earlier study of 09/15/2021. Review of the MIP images confirms the above findings. CT ABDOMEN and PELVIS FINDINGS Hepatobiliary: No acute or suspicious findings within the liver. Tiny hypodensities again noted within the liver, too small to definitively characterize but most likely small cysts. Gallbladder is unremarkable. No bile duct dilatation seen. Pancreas: Unremarkable. No pancreatic ductal dilatation or surrounding inflammatory changes. Spleen: Normal in size without focal abnormality. Adrenals/Urinary Tract: Adrenal glands appear normal. LEFT renal cyst. Kidneys otherwise unremarkable without suspicious mass, stone or hydronephrosis. Bladder is decompressed. Stomach/Bowel: No dilated large or small bowel loops. No evidence of bowel wall inflammation. Appendix is normal. Stomach is unremarkable, partially decompressed. Vascular/Lymphatic: Aortic atherosclerosis. No acute-appearing vascular abnormality. No  enlarged lymph nodes are seen in the abdomen or pelvis. Reproductive: Prostate is unremarkable. Other: No free fluid or abscess collection is seen within the abdomen or pelvis. No free intraperitoneal air. Musculoskeletal: No acute or suspicious osseous abnormality within the abdomen or pelvis. Review of the MIP images confirms the above findings. IMPRESSION: 1. No new findings within the chest, abdomen or pelvis. No pulmonary embolism. No evidence of pneumonia or pulmonary edema. 2. Stable small RIGHT pleural effusion, with overlying mild atelectasis. 3. Bilateral pulmonary nodules and soft tissue masses within the  soft tissues of the RIGHT chest wall, as described above, not significantly changed in the short-term interval compared to the earlier study of 09/15/2021. 4. Stable destructive change within the posterior eleventh rib. 5. No acute findings within the abdomen or pelvis. No evidence of metastatic disease within the abdomen or pelvis. Aortic Atherosclerosis (ICD10-I70.0). Electronically Signed   By: Franki Cabot M.D.   On: 10/02/2021 21:27   CT Abdomen Pelvis W Contrast  Result Date: 09/15/2021 CLINICAL DATA:  Right-sided abdominal pain and flank pain. EXAM: CT ABDOMEN AND PELVIS WITH CONTRAST TECHNIQUE: Multidetector CT imaging of the abdomen and pelvis was performed using the standard protocol following bolus administration of intravenous contrast. RADIATION DOSE REDUCTION: This exam was performed according to the departmental dose-optimization program which includes automated exposure control, adjustment of the mA and/or kV according to patient size and/or use of iterative reconstruction technique. CONTRAST:  157mL OMNIPAQUE IOHEXOL 350 MG/ML SOLN COMPARISON:  May 05, 2021 FINDINGS: Lower chest: Multiple subcentimeter noncalcified lung nodules are seen within the posterolateral aspect of the left lung base. Mild posterior right basilar scarring and/or atelectasis is noted. A small right  pleural effusion is seen. Hepatobiliary: Punctate foci of parenchymal low attenuation are seen within the anterior aspect of the liver dome and inferior aspect of the right lobe of the liver (axial CT images 10 and 32, CT series 5). These are too small to characterize by CT examination. No gallstones, gallbladder wall thickening, or biliary dilatation. Pancreas: Unremarkable. No pancreatic ductal dilatation or surrounding inflammatory changes. Spleen: Normal in size without focal abnormality. Adrenals/Urinary Tract: Adrenal glands are unremarkable. Kidneys are normal in size, without renal calculi or hydronephrosis. A stable simple cyst is seen within the anterior aspect of the mid left kidney. Bladder is unremarkable. Stomach/Bowel: Stomach is within normal limits. Appendix appears normal. No evidence of bowel wall thickening, distention, or inflammatory changes. Noninflamed diverticula are seen within the sigmoid colon. Vascular/Lymphatic: Aortic atherosclerosis. No enlarged abdominal or pelvic lymph nodes. Reproductive: The prostate gland is mildly enlarged. Other: No abdominal wall hernia or abnormality. No abdominopelvic ascites. Musculoskeletal: Multiple soft tissue masses are seen within the musculature and subcutaneous fat of the lateral and posterior aspects of the lower right chest wall. Pleural invasion is noted within the posterior aspect of the right lung base. These soft tissue masses extend along the posterior wall of the mid and upper right abdomen. This represents a new finding. Lytic lesions are seen involving multiple right ribs with a pathologic fracture noted involving the posterior eleventh right rib. Degenerative changes are seen within the lumbar spine. IMPRESSION: 1. Multiple neoplastic soft tissue masses within the lateral and posterior aspects of the lower right chest wall with associated pleural invasion. 2. Lytic lesions involving multiple right ribs with a pathologic fracture of the  posterior eleventh right rib. 3. Multiple subcentimeter noncalcified lung nodules within the left lung base, consistent with pulmonary metastatic disease. 4. Sigmoid diverticulosis. 5. Small right pleural effusion. 6. Aortic atherosclerosis. Aortic Atherosclerosis (ICD10-I70.0). Electronically Signed   By: Virgina Norfolk M.D.   On: 09/15/2021 00:57   DG Chest Portable 1 View  Result Date: 10/02/2021 CLINICAL DATA:  Metastatic lung cancer being treated with immunotherapy. Generalized abdominal pain and back pain. EXAM: PORTABLE CHEST 1 VIEW COMPARISON:  09/16/2021 FINDINGS: Previous median sternotomy and CABG. Power port in place on the right with the tip at the SVC RA junction. Previous lobectomy on the right. The lungs are presently clear. No bone lesion visible on  the frontal chest radiography. Known right eleventh rib lesion not visible using this technique. Aortic atherosclerotic calcification incidentally noted. IMPRESSION: Previous lobectomy on the right.  No active disease. Electronically Signed   By: Nelson Chimes M.D.   On: 10/02/2021 19:49   MR TOTAL SPINE METS SCREENING  Result Date: 10/10/2021 CLINICAL DATA:  Screening exam for spinal metastatic disease. EXAM: MRI TOTAL SPINE WITHOUT AND WITH CONTRAST TECHNIQUE: Multisequence MR imaging of the spine from the cervical spine to the sacrum was performed prior to and following IV contrast administration for evaluation of spinal metastatic disease. CONTRAST:  8 cc of Gadavist intravenous COMPARISON:  10/02/2021 CT of the chest and abdomen. Thoracic and lumbar spine MRI 02/18/2021. FINDINGS: MRI CERVICAL SPINE FINDINGS Alignment: Mild degenerative anterolisthesis at C3-4. Vertebrae: C7-T1 ACDF with superior endplate depression at C7. Probable solid arthrodesis at this level. Signal in the C7 vertebra on postcontrast imaging is likely from metallic susceptibility artifact and failure of fat suppression. No evidence of metastatic disease. Cord: Normal  cord signal.  No abnormal intrathecal enhancement. Posterior Fossa, vertebral arteries, paraspinal tissues: Unremarkable Disc levels: Multilevel facet spurring. C7-T1 ACDF. Mild disc bulging partially effacing ventral subarachnoid spaces at multiple levels. No degenerative cord compression. MRI THORACIC SPINE FINDINGS Alignment:  Unremarkable Vertebrae: Diffusely preserved fatty marrow. No evidence of bony metastasis, fracture, or inflammation. Known posterior right eleventh rib lesion with inter spinous tumor infiltration by CT. No continuation into the adjacent foramen or spinal canal. Cord:  Normal signal and morphology. Paraspinal and other soft tissues: Known right paravertebral tumor as evaluated by CT. Disc levels: Generalized spondylosis. MRI LUMBAR SPINE FINDINGS Segmentation:  5 lumbar type vertebrae Alignment:  Physiologic Vertebrae: No evidence of metastasis. L1 hemangioma as previously demonstrated. Conus medullaris: Extends to the L1-2 level and appears normal. No abnormal intrathecal enhancement Paraspinal and other soft tissues: No visible mass or inflammation. Disc levels: Ordinary degenerative changes as previously demonstrated. Generalized disc space narrowing and bulging. IMPRESSION: Known posterior right eleventh rib lesion with soft tissue extension. No adjacent foraminal or spinal canal infiltration. Negative for metastatic disease throughout the spine. Electronically Signed   By: Jorje Guild M.D.   On: 10/10/2021 10:41     Assessment and Plan: 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[place])  [name] is clinically stable.  Brain MRI (done in the hospital) actually demonstrates small novel/untreated metastasis within the corpus of right thalamus.  Treatment plan fusion confirmed this lesion was not previously treated.  Other targets are stable or within expectation for post-SRS effects.  We recommended treating with salvage SRS; he is agreeable with this plan.  3T planning study  may be needed.  We reviewed MRI total spine which did not demonstrate bony or leptomeningeal disease.    Follow Up Instructions: Refer to rad-onc, RTC in 3 months following salvage SRS, or sooner if needed.  I discussed the assessment and treatment plan with the patient.  The patient was provided an opportunity to ask questions and all were answered.  The patient agreed with the plan and demonstrated understanding of the instructions.    The patient was advised to call back or seek an in-person evaluation if the symptoms worsen or if the condition fails to improve as anticipated.  I provided 5-10 minutes of non-face-to-face time during this enocunter.  Ventura Sellers, MD   I provided 22 minutes of non face-to-face telephone visit time during this encounter, and > 50% was spent counseling as documented under my assessment &  plan.

## 2021-10-12 NOTE — Progress Notes (Incomplete)
Radiation Oncology         [phone] ________________________________  Outpatient Follow Up - Conducted via telephone due to current COVID-19 concerns for limiting patient exposure  I spoke with the patient to conduct this consult visit via telephone to spare the patient unnecessary potential exposure in the healthcare setting during the current COVID-19 pandemic. The patient was notified in advance and was offered a [place] meeting to allow for face to face communication but unfortunately reported that they did not have the appropriate resources/technology to support such a visit and instead preferred to proceed with a telephone visit.    Name: [name]        MRN: [id number]  Date of Service: 10/14/2021 DOB: [birth year]  FW:YOVZCH, Anthony Main, MD  Curt Bears, MD     REFERRING PHYSICIAN: Curt Bears, MD   DIAGNOSIS: The encounter diagnosis was Malignant poorly differentiated neuroendocrine carcinoma [facility]).   HISTORY OF PRESENT ILLNESS: [name] is a 66 y.o. 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 The patient was diagnosed in April 2022 with his lung carcinoma at the time of undergoing right upper lobectomy. Final pathology showed nodal involvement of the hilar lymph node, and a primary tumor measuring 2.4 cm, margins were negative.  He started cisplatin and etoposide given the neuroendocrine features.  He underwent MRI of the brain on 01/28/2020 which revealed an enhancing 6.5 mm lesion in the left lateral cerebellum consistent with metastasis.  Given these findings, he was worked up for and proceeded with stereotactic radiosurgery [facility]) which he completed in June 2022.   He met with Dr. Lisbeth Renshaw last week for evaluation of postoperative radiotherapy to the right chest wall after undergoing surgical resection on 07/29/21. The resection was consistent with large cell neuroendocrine carcinoma with  cauterized tumor at the resection margin.  He was recently treated in December for 4 lesions seen in the brain with single fraction radiotherapy.  This treatment was completed on 08/26/2021.  He had also developed a right chest wall recurrence of his cancer and received a palliative course of radiotherapy as well.  He has been under the care of the palliative care team due to his pain.  He is seen today as a symptom of headache prompted an MRI of the brain on 10/08/2021 and our discussion he had also seen Dr. [name] and had an MRI of the spine on 10/10/2021 that was negative for disease in the spinal canal or column.  From discussion however in brain and spine conference the patient does have a new punctate lesion in the ***temporal lobe.  Recommendations were made to proceed with stereotactic radiosurgery [facility]) and for this reason he is seen to discuss this treatment.  Of note he did begin systemic Keytruda, carboplatin and Alimta on 09/29/2021.  He is scheduled for his next dose on 10/19/2021.    PREVIOUS RADIATION THERAPY:   09/01/2021 through 09/23/2021 Site Technique Total Dose (Gy) Dose per Fx (Gy) Completed Fx Beam Energies  Chest Wall, Right: CW_R 3D 37.5/37.5 2.5 15/15 6X   08/26/2021 through 08/26/2021 SRS Treatment Site Technique Total Dose (Gy) Dose per Fx (Gy) Completed Fx Beam Energies  Brain:    PTV_6_InsulaR_23m PTV_5_PostFrontR_629mPTV_4_ParietL_5m20mTV_3_FrontL_7mm47mRT 20/20 20 1/1 6XFFF   02/13/2021 through 02/13/2021 SRS Treatment: Site Technique Total Dose (Gy) Dose per Fx (Gy) Completed Fx Beam Energies  Brain: PTV_1CerebLt6mm 78m20/20 20 1/1 6XFFF  Brain: PTV_2 Lt Frontal 3mm75m  3D 20/20 20 1/1 6XFFF     PAST MEDICAL HISTORY:  Past Medical History:  Diagnosis Date   Anemia    Anxiety    Arthritis    Basal cell carcinoma (BCC) of forehead    CAD (coronary artery disease)    a. 10/2015 ant STEMI >> LHC with 3 v CAD; oLAD tx with POBA >> emergent CABG. b. Multiple evals  since that time, early graft failure of SVG-RCA by cath 03/2016. c. 2/19 PCI/DES x1 to pRCA, normal EF.   Carotid artery disease ([place])    a. 40-59% BICA 02/2018.   Depression    Dyspnea    Ectopic atrial tachycardia (HCC)    Esophageal reflux    eosinophil esophagitis   Family history of adverse reaction to anesthesia    "sister has PONV" (06/21/2017)   Former tobacco use    Gout    Hepatitis C    "treated and cured" (06/21/2017)   High cholesterol    History of blood transfusion    History of kidney stones    Hypertension    Ischemic cardiomyopathy    a. EF 25-30% at intraop TEE 4/17  //  b. Limited Echo 5/17 - EF 45-50%, mild ant HK. c. EF 55-65% by cath 09/2017.   Migraine    "3-4/yr" (06/21/2017)   Myocardial infarction ([place]) 10/2015   Palpitations    Pneumonia    Sinus bradycardia    a. HR dropping into 40s in 02/2016 -> BB reduced.   Stroke [facility]) 10/2016   "small one; sometimes my memory/cognitive issues" (06/21/2017)   Symptomatic hypotension    a. 02/2016 ER visit -> meds reduced.   Syncope    Wears dentures    Wears glasses        PAST SURGICAL HISTORY: Past Surgical History:  Procedure Laterality Date   25 GAUGE PARS PLANA VITRECTOMY WITH 20 GAUGE MVR PORT  12/31/2020   Procedure: 25 GAUGE PARS PLANA VITRECTOMY WITH 20 GAUGE MVR PORT;  Surgeon: [name], MD;  Location: [place];  Service: Thoracic;;   ANTERIOR CERVICAL DECOMP/DISCECTOMY FUSION N/A 10/17/2018   Procedure: Anterior Cervical Decompression Fusion - Cervical seven -Thoracic one;  Surgeon: Consuella Lose, MD;  Location: [place];  Service: Neurosurgery;  Laterality: N/A;   BASAL CELL CARCINOMA EXCISION     "forehead   BIOPSY  07/20/2019   Procedure: BIOPSY;  Surgeon: Carol Ada, MD;  [facility]: [facility];  Service: Endoscopy;;   BIOPSY OF MEDIASTINAL MASS Right 07/29/2021   Procedure: RIGHT CHEST WALL MASS BIOPSY;  Surgeon: [name], MD;  Location: [place];  Service: Thoracic;   Laterality: Right;   BRONCHIAL BIOPSY  12/24/2020   Procedure: BRONCHIAL BIOPSIES;  Surgeon: Garner Nash, DO;  Location: [place] [facility];  Service: Pulmonary;;   BRONCHIAL BRUSHINGS  12/24/2020   Procedure: BRONCHIAL BRUSHINGS;  Surgeon: Garner Nash, DO;  Location: [place];  Service: Pulmonary;;   BRONCHIAL NEEDLE ASPIRATION BIOPSY  12/24/2020   Procedure: BRONCHIAL NEEDLE ASPIRATION BIOPSIES;  Surgeon: Garner Nash, DO;  Location: [place];  Service: Pulmonary;;   CARDIAC CATHETERIZATION N/A 11/28/2015   Procedure: Left Heart Cath and Coronary Angiography;  Surgeon: [name], MD;  Location: [place] CV LAB;  Service: Cardiovascular;  Laterality: N/A;   CARDIAC CATHETERIZATION N/A 11/28/2015   Procedure: Coronary Balloon Angioplasty;  Surgeon: [name], MD;  Location: [place] CV [facility];  Service: Cardiovascular;  Laterality: N/A;  ostial LAD  CARDIAC CATHETERIZATION N/A 11/28/2015   Procedure: Coronary/Graft Angiography;  Surgeon: [name], MD;  Location: [place] CV LAB;  Service: Cardiovascular;  Laterality: N/A;  coronaries only    CARDIAC CATHETERIZATION N/A 04/21/2016   Procedure: Left Heart Cath and Coronary Angiography;  Surgeon: Wellington Hampshire, MD;  Location: [place] CV LAB;  Service: Cardiovascular;  Laterality: N/A;   CARDIAC CATHETERIZATION N/A 06/14/2016   Procedure: Left Heart Cath and Cors/Grafts Angiography;  Surgeon: Lorretta Harp, MD;  Location: [place] [facility];  Service: Cardiovascular;  Laterality: N/A;   CARDIAC CATHETERIZATION N/A 09/08/2016   Procedure: Left Heart Cath and Cors/Grafts Angiography;  Surgeon: Wellington Hampshire, MD;  Location: [place] CV LAB;  Service: Cardiovascular;  Laterality: N/A;   CARDIAC CATHETERIZATION     CORONARY ARTERY BYPASS GRAFT N/A 11/28/2015   Procedure: CORONARY ARTERY BYPASS GRAFTING (CABG) TIMES FIVE USING LEFT INTERNAL MAMMARY ARTERY AND RIGHT GREATER SAPHENOUS,VIEN  HARVEATED BY ENDOVIEN, INTRAOPPRATIVE TEE;  Surgeon: Gaye Pollack, MD;  Location: [place];  Service: Open Heart Surgery;  Laterality: N/A;   CORONARY STENT INTERVENTION N/A 10/05/2017   Procedure: CORONARY STENT INTERVENTION;  Surgeon: [name], MD;  Location: [place] CV LAB;  Service: Cardiovascular;  Laterality: N/A;   ESOPHAGOGASTRODUODENOSCOPY (EGD) WITH PROPOFOL N/A 07/20/2019   Procedure: ESOPHAGOGASTRODUODENOSCOPY (EGD) WITH PROPOFOL;  Surgeon: Carol Ada, MD;  [facility];  Service: Endoscopy;  Laterality: N/A;   ESOPHAGOGASTRODUODENOSCOPY (EGD) WITH PROPOFOL N/A 01/30/2021   Procedure: ESOPHAGOGASTRODUODENOSCOPY (EGD) WITH PROPOFOL;  Surgeon: Carol Ada, MD;  [facility];  Service: Endoscopy;  Laterality: N/A;   FIDUCIAL MARKER PLACEMENT  12/24/2020   Procedure: FIDUCIAL DYE MARKER PLACEMENT;  Surgeon: Garner Nash, DO;  Location: [place] [facility];  Service: Pulmonary;;   HUMERUS SURGERY Right 1969   "tumor inside bone; filled it w/bone chips"   INTERCOSTAL NERVE BLOCK Right 12/24/2020   Procedure: INTERCOSTAL NERVE BLOCK;  Surgeon: [name], MD;  Location: [place];  Service: Thoracic;  Laterality: Right;   IR IMAGING GUIDED PORT INSERTION  02/06/2021   LEFT HEART CATH AND CORS/GRAFTS ANGIOGRAPHY N/A 03/11/2017   Procedure: Left Heart Cath and Cors/Grafts Angiography;  Surgeon: Leonie Man, MD;  Location: [place] CV LAB;  Service: Cardiovascular;  Laterality: N/A;   LEFT HEART CATH AND CORS/GRAFTS ANGIOGRAPHY N/A 10/05/2017   Procedure: LEFT HEART CATH AND CORS/GRAFTS ANGIOGRAPHY;  Surgeon: [name], MD;  Location: [place] CV LAB;  Service: Cardiovascular;  Laterality: N/A;   LEFT HEART CATH AND CORS/GRAFTS ANGIOGRAPHY N/A 04/11/2019   Procedure: LEFT HEART CATH AND CORS/GRAFTS ANGIOGRAPHY;  Surgeon: [name], MD;  Location: [place] CV LAB;  Service: Cardiovascular;  Laterality: N/A;   NODE DISSECTION  Right 12/24/2020   Procedure: NODE DISSECTION;  Surgeon: [name], MD;  Location: [place];  Service: Thoracic;  Laterality: Right;   PERIPHERAL VASCULAR CATHETERIZATION N/A 06/14/2016   Procedure: Lower Extremity Angiography;  Surgeon: Lorretta Harp, MD;  Location: [place] CV LAB;  Service: Cardiovascular;  Laterality: N/A;   VIDEO BRONCHOSCOPY WITH ENDOBRONCHIAL NAVIGATION Right 12/24/2020   Procedure: VIDEO BRONCHOSCOPY WITH ENDOBRONCHIAL NAVIGATION;  Surgeon: Garner Nash, DO;  Location: [place];  Service: Pulmonary;  Laterality: Right;   VIDEO BRONCHOSCOPY WITH ENDOBRONCHIAL ULTRASOUND N/A 12/24/2020   Procedure: VIDEO BRONCHOSCOPY WITH ENDOBRONCHIAL ULTRASOUND;  Surgeon: Garner Nash, DO;  Location: [place];  Service: Pulmonary;  Laterality: N/A;   VIDEO BRONCHOSCOPY WITH INSERTION  OF INTERBRONCHIAL VALVE (IBV) N/A 12/31/2020   Procedure: VIDEO BRONCHOSCOPY WITH INSERTION OF INTERBRONCHIAL VALVE (IBV).VALVE IN CARTRIDGE 32m,9mm. CHEST TUBE PLACEMENT.;  Surgeon: LLajuana Matte MD;  [facility];  Service: Thoracic;  Laterality: N/A;   VIDEO BRONCHOSCOPY WITH INSERTION OF INTERBRONCHIAL VALVE (IBV) N/A 07/29/2021   Procedure: VIDEO BRONCHOSCOPY WITH REMOVAL OF INTERBRONCHIAL VALVE (IBV);  Surgeon: LLajuana Matte MD;  [facility];  Service: Thoracic;  Laterality: N/A;     FAMILY HISTORY:  Family History  Problem Relation Age of Onset   Lung cancer Mother    Heart Problems Father    Heart attack Father [phone]  Stroke Father    Heart failure Father    Heart attack Maternal Grandmother    Stroke Maternal Grandmother    Heart attack Paternal Uncle    Hypertension Brother    Autoimmune disease Neg Hx      SOCIAL HISTORY:  reports that he quit smoking about 5 years ago. His smoking use included cigarettes. He has a 33.00 pack-year smoking history. He has never used smokeless tobacco. He reports that he does not currently use alcohol. He  reports that he does not use drugs.  The patient is married and lives in [place]  He owns a [employer]   ALLERGIES: Prednisone, Tetanus toxoids, Wellbutrin [bupropion], and Varenicline   MEDICATIONS:  Current Outpatient Medications  Medication Sig Dispense Refill   acetaminophen (TYLENOL) 500 MG tablet Take 1,000 mg by mouth every 6 (six) hours as needed for mild pain, fever or headache.     aspirin EC 81 MG tablet Take 81 mg by mouth daily. Swallow whole.     docusate sodium (COLACE) 100 MG capsule Take 100-200 mg by mouth daily.     DULoxetine (CYMBALTA) 30 MG capsule Take 1 capsule (30 mg total) by mouth 2 (two) times daily. 60 capsule 1   ferrous gluconate (FERGON) 324 MG tablet Take 324 mg by mouth daily with breakfast.     folic acid (FOLVITE) 1 MG tablet Take 1 tablet (1 mg total) by mouth daily. 30 tablet 4   gabapentin (NEURONTIN) 300 MG capsule Take 1 capsule (300 mg total) by mouth 3 (three) times daily.     HYDROmorphone (DILAUDID) 4 MG tablet Take 1-2 tablets (4-8 mg total) by mouth every 3 (three) hours as needed for severe pain. 120 tablet 0   lidocaine (LIDODERM) 5 % Place 1 patch onto the skin daily as needed (for pain- Remove & Discard patch within 12 hours or as directed by MD).     lidocaine-prilocaine (EMLA) cream Apply to the Port-A-Cath site 30-60-minute before treatment (Patient taking differently: Apply 1 application topically See admin instructions. Apply to the Port-A-Cath site 30-60-minutes before treatment) 30 g 0   metoprolol succinate (TOPROL-XL) 25 MG 24 hr tablet Take 1 tablet (25 mg total) by mouth daily. (Patient taking differently: Take 25 mg by mouth every evening.) 90 tablet 3   midodrine (PROAMATINE) 2.5 MG tablet Take 2.5 mg by mouth 3 (three) times daily as needed (AS DIRECTED).     morphine (MS CONTIN) 60 MG 12 hr tablet Take 1 tablet (60 mg total) by mouth every 8 (eight) hours. **10/02/2021 90 tablet 0   Multiple Vitamin (MULTIVITAMIN WITH  MINERALS) TABS tablet Take 1 tablet by mouth daily.     ondansetron (ZOFRAN) 8 MG tablet Take 1 tablet (8 mg total) by mouth every 8 (eight) hours as needed for nausea or vomiting. Starting 3  days after chemotherapy 90 tablet 2   pantoprazole (PROTONIX) 40 MG tablet Take 1 tablet by mouth daily at 6 AM. 30 tablet 1   polyethylene glycol (MIRALAX / GLYCOLAX) 17 g packet Take 17 g by mouth daily. (Patient not taking: Reported on 10/02/2021) 14 each 0   prochlorperazine (COMPAZINE) 10 MG tablet Take 1 tablet (10 mg total) by mouth every 6 (six) hours as needed for nausea or vomiting. 30 tablet 0   ranolazine (RANEXA) 1000 MG SR tablet Take 1 tablet (1,000 mg total) by mouth 2 (two) times daily. 180 tablet 3   senna-docusate (SENOKOT-S) 8.6-50 MG tablet Take 1 tablet by mouth 2 (two) times daily.     tiZANidine (ZANAFLEX) 2 MG tablet Take 1 tablet (2 mg total) by mouth at bedtime as needed for muscle spasms. (Patient taking differently: Take 2 mg by mouth at bedtime.) 30 tablet 0   XARELTO 20 MG TABS tablet Take 20 mg by mouth every evening.     No current facility-administered medications for this visit.     REVIEW OF SYSTEMS: On review of systems, the patient reports that he is doing okay. He has some multifocal headaches in the middle of the night or early morning. Sometimes it's in the morning when he has this. Tylenol takes the edge off, but his headaches predate surgery and prior to taking morphine for his postop chest wall pain. He reports these do not appear to be related to nausea or light sensitivity. He reports numbness from his right elbow to the middle of his right pinky finger. This is constant, and he has also noticed frequent episodes of jerking of his right lower extremity at night. No loss of consciousness has occurred but he's worried if this is posibbly a seizure. He has also recently had respiratory complaints of cough, and nausea and vomiting related to having clinical diagnosis of flu.  His wife tested positive for flu  and while he did not test for this, he has had the same symptoms. No other complaints are verbalized.   PHYSICAL EXAM:  Unable to assess given encounter type.   ECOG = 1  0 - Asymptomatic (Fully active, able to carry on all predisease activities without restriction)  1 - Symptomatic but completely ambulatory (Restricted in physically strenuous activity but ambulatory and able to carry out work of a light or sedentary nature. For example, light housework, office work)  2 - Symptomatic, <50% in bed during the day (Ambulatory and capable of all self care but unable to carry out any work activities. Up and about more than 50% of waking hours)  3 - Symptomatic, >50% in bed, but not bedbound (Capable of only limited self-care, confined to bed or chair 50% or more of waking hours)  4 - Bedbound (Completely disabled. Cannot carry on any self-care. Totally confined to bed or chair)  5 - Death   Eustace Pen MM, Creech RH, Tormey DC, et al. [phone]). "Toxicity and response criteria of the [facility]". [place] Oncol. 5 (6): 649-55    LABORATORY DATA:  Lab Results  Component Value Date   WBC 1.4 (LL) 10/08/2021   HGB 8.9 (L) 10/08/2021   HCT 27.1 (L) 10/08/2021   MCV 92.2 10/08/2021   PLT 64 (L) 10/08/2021   Lab Results  Component Value Date   NA 136 10/08/2021   K 4.4 10/08/2021   CL 105 10/08/2021   CO2 27 10/08/2021   Lab Results  Component Value  Date   ALT 18 10/02/2021   AST 22 10/02/2021   ALKPHOS 72 10/02/2021   BILITOT 0.8 10/02/2021      RADIOGRAPHY: DG Chest 2 View  Result Date: 09/14/2021 CLINICAL DATA:  Right lower abdominal pain with cough and nausea. EXAM: CHEST - 2 VIEW COMPARISON:  August 29, 2021 FINDINGS: There is stable right-sided venous Port-A-Cath positioning. Multiple sternal wires and vascular clips are seen. Mild atelectasis is noted within the right lung base. A small right pleural effusion is  present. No pneumothorax is identified. The heart size and mediastinal contours are within normal limits. A radiopaque fusion plate and screws are seen overlying the lower cervical spine. The visualized skeletal structures are otherwise unremarkable. IMPRESSION: 1. Stable postoperative changes with mild right basilar atelectasis. 2. Small right pleural effusion. Electronically Signed   By: Virgina Norfolk M.D.   On: 09/14/2021 23:17   DG Ribs Unilateral W/Chest Right  Result Date: 09/16/2021 CLINICAL DATA:  Right posterior rib fracture, right flank pain, history of metastatic disease EXAM: RIGHT RIBS AND CHEST - 3+ VIEW COMPARISON:  09/15/2021, 09/14/2021 FINDINGS: Frontal view of the chest as well as frontal and oblique views of the right thoracic cage are obtained. Cardiac silhouette is stable. Postsurgical changes from median sternotomy. Right chest wall port unchanged. Small right pleural effusion.  No airspace disease or pneumothorax. Lytic lesion posterior right eleventh rib with associated pathologic fracture identified, better visualized on recent CT. The other smaller lytic lesions throughout the remainder of the thoracic cage are not as well visualized by radiograph. IMPRESSION: 1. Lytic lesion and pathologic fracture posterior right eleventh rib. 2. Small right pleural effusion. 3. Please refer to recent CT report describing numerous other small lytic lesions within the right thoracic cage. Electronically Signed   By: Randa Ngo M.D.   On: 09/16/2021 22:02   CT Angio Chest PE W and/or Wo Contrast  Result Date: 10/02/2021 CLINICAL DATA:  Lung cancer patient with bone metastasis. Generalized abdominal pain and low back pain status post commencement of immunotherapy. EXAM: CT ANGIOGRAPHY CHEST CT ABDOMEN AND PELVIS WITH CONTRAST TECHNIQUE: Multidetector CT imaging of the chest was performed using the standard protocol during bolus administration of intravenous contrast. Multiplanar CT image  reconstructions and MIPs were obtained to evaluate the vascular anatomy. Multidetector CT imaging of the abdomen and pelvis was performed using the standard protocol during bolus administration of intravenous contrast. RADIATION DOSE REDUCTION: This exam was performed according to the departmental dose-optimization program which includes automated exposure control, adjustment of the mA and/or kV according to patient size and/or use of iterative reconstruction technique. CONTRAST:  172mL OMNIPAQUE IOHEXOL 350 MG/ML SOLN COMPARISON:  Chest CT angiogram dated 09/15/2021. CT abdomen and pelvis dated 09/15/2021. FINDINGS: CTA CHEST FINDINGS Cardiovascular: There is no pulmonary embolism identified within the [place], lobar or central segmental pulmonary arteries bilaterally. Some of the most peripheral pulmonary artery branches to each lung are difficult to definitively characterize due to mild patient breathing motion artifact. No pericardial effusion. Aortic atherosclerosis. No evidence of aortic dissection. Mediastinum/Nodes: No mass or enlarged lymph nodes are identified within the mediastinum or perihilar regions. Esophagus is unremarkable. Trachea is unremarkable. Surgical changes at the RIGHT hilum, compatible with a previous surgical procedure/resection. Lungs/Pleura: Bilateral pulmonary nodules are not significantly changed in the short-term interval, compared to most recent chest CT of 09/15/2021. No new nodules seen. Stable small RIGHT pleural effusion, with overlying mild atelectasis. No pneumothorax. Musculoskeletal: Again noted is destructive change within the posterior  eleventh rib. Again noted are multiple soft tissue masses within the soft tissues of the lateral and posterior RIGHT chest wall, not significantly changed in appearance compared to the earlier study of 09/15/2021. Review of the MIP images confirms the above findings. CT ABDOMEN and PELVIS FINDINGS Hepatobiliary: No acute or suspicious findings  within the liver. Tiny hypodensities again noted within the liver, too small to definitively characterize but most likely small cysts. Gallbladder is unremarkable. No bile duct dilatation seen. Pancreas: Unremarkable. No pancreatic ductal dilatation or surrounding inflammatory changes. Spleen: Normal in size without focal abnormality. Adrenals/Urinary Tract: Adrenal glands appear normal. LEFT renal cyst. Kidneys otherwise unremarkable without suspicious mass, stone or hydronephrosis. Bladder is decompressed. Stomach/Bowel: No dilated large or small bowel loops. No evidence of bowel wall inflammation. Appendix is normal. Stomach is unremarkable, partially decompressed. Vascular/Lymphatic: Aortic atherosclerosis. No acute-appearing vascular abnormality. No enlarged lymph nodes are seen in the abdomen or pelvis. Reproductive: Prostate is unremarkable. Other: No free fluid or abscess collection is seen within the abdomen or pelvis. No free intraperitoneal air. Musculoskeletal: No acute or suspicious osseous abnormality within the abdomen or pelvis. Review of the MIP images confirms the above findings. IMPRESSION: 1. No new findings within the chest, abdomen or pelvis. No pulmonary embolism. No evidence of pneumonia or pulmonary edema. 2. Stable small RIGHT pleural effusion, with overlying mild atelectasis. 3. Bilateral pulmonary nodules and soft tissue masses within the soft tissues of the RIGHT chest wall, as described above, not significantly changed in the short-term interval compared to the earlier study of 09/15/2021. 4. Stable destructive change within the posterior eleventh rib. 5. No acute findings within the abdomen or pelvis. No evidence of metastatic disease within the abdomen or pelvis. Aortic Atherosclerosis (ICD10-I70.0). Electronically Signed   By: Franki Cabot M.D.   On: 10/02/2021 21:27   CT Angio Chest PE W/Cm &/Or Wo Cm  Result Date: 09/15/2021 CLINICAL DATA:  Dyspnea with right-sided abdominal  pain and flank pain. EXAM: CT ANGIOGRAPHY CHEST WITH CONTRAST TECHNIQUE: Multidetector CT imaging of the chest was performed using the standard protocol during bolus administration of intravenous contrast. Multiplanar CT image reconstructions and MIPs were obtained to evaluate the vascular anatomy. RADIATION DOSE REDUCTION: This exam was performed according to the departmental dose-optimization program which includes automated exposure control, adjustment of the mA and/or kV according to patient size and/or use of iterative reconstruction technique. CONTRAST:  19m OMNIPAQUE IOHEXOL 350 MG/ML SOLN COMPARISON:  August 18, 2021 FINDINGS: Cardiovascular: A right-sided venous Port-A-Cath is in place. There is mild to moderate severity calcification of the aortic arch. Satisfactory opacification of the pulmonary arteries to the segmental level. No evidence of pulmonary embolism. Normal heart size. No pericardial effusion. Mediastinum/Nodes: No enlarged mediastinal, hilar, or axillary lymph nodes. Thyroid gland, trachea, and esophagus demonstrate no significant findings. Lungs/Pleura: A 12 mm x 11 mm noncalcified lung nodule is seen within the posteromedial aspect of the left upper lobe (axial CT image 29, CT series 4). This measures approximately 11 mm x 9 mm on the prior study. 6 mm and 4 mm noncalcified lung nodules are seen within the posterior aspect of the right upper lobe (axial CT images 37 and 46, CT series 2). These are new findings when compared to the prior study. New 3 mm and 6 mm noncalcified lung nodules are seen within the posterolateral aspects of the right lower lobe (axial CT images 52, 59 and 66, CT series 4). Mild atelectatic changes are seen within the posterior aspect  of the right lung base. A very small right pleural effusion is seen. No pneumothorax is identified. Upper Abdomen: A stable simple cyst is seen within the left kidney. Musculoskeletal: Multiple sternal wires are present. Lytic  lesions are again seen involving multiple right ribs. A pathologic fracture of the posterior aspect of the eleventh right rib is again seen. There has been interval enlargement of the soft tissue lesions seen within the posterior right chest wall with pleural invasion along the posteromedial aspect of the right lung base. A metallic density fixation plate and screws are seen along the anterior aspect of the lower cervical spine. The level degenerative changes are seen throughout the thoracic spine. Review of the MIP images confirms the above findings. IMPRESSION: 1. No evidence for pulmonary embolism. 2. Very mild interval increase in size of the previously noted left upper lobe lung nodule, with multiple new bilateral noncalcified lung nodules, consistent with pulmonary metastasis. 3. Very small right pleural effusion. 4. Interval enlargement of the soft tissue mass is seen within the posterior right chest wall with pleural invasion along right lung base. 5. Multiple right-sided lytic lesions with a pathologic fracture of the posterior eleventh right rib. Aortic Atherosclerosis (ICD10-I70.0). Electronically Signed   By: Virgina Norfolk M.D.   On: 09/15/2021 00:49   MR BRAIN W WO CONTRAST  Result Date: 10/08/2021 CLINICAL DATA:  Sudden onset severe headache. History of lung cancer with metastatic to brain. Post radiation and chemo. EXAM: MRI HEAD WITHOUT AND WITH CONTRAST TECHNIQUE: Multiplanar, multiecho pulse sequences of the brain and surrounding structures were obtained without and with intravenous contrast. CONTRAST:  63m GADAVIST GADOBUTROL 1 MMOL/ML IV SOLN COMPARISON:  MRI head with contrast 08/14/2021 FINDINGS: Brain: Multiple small metastatic deposits in the brain. Mixed response to treatment with several lesions larger and most lesions smaller. Larger lesions: Left cerebellar 4 mm lesion axial image 33 appears larger with new area of surrounding edema. Associated chronic hemorrhage has progressed.  This is a treated lesion and enlargement could be related to tumor growth or treatment effect. Enhancing lesion in the left globus pallidus region measures 5 mm and is increased in size. Associated chronic hemorrhage is unchanged. No edema. Stable or smaller lesions: 3 mm lesion right posterior insula axial image 86 slightly smaller 2 mm lesion left parietal cortex axial image 128 smaller 3 mm lesion left frontal cortex axial image 129 is smaller 3 mm lesion right frontal cortex over the convexity is slightly smaller. Axial image 143 Ventricle size normal.  Negative for acute infarct. Vascular: Normal arterial flow voids Skull and upper cervical spine: No suspicious skeletal lesion. Sinuses/Orbits: Paranasal sinuses clear.  Negative orbit Other: None IMPRESSION: Multiple small metastatic deposits to the brain. Mixed response with 2 lesions mildly larger and the remaining lesions stable or smaller. Electronically Signed   By: CFranchot GalloM.D.   On: 10/08/2021 12:27   CT ABDOMEN PELVIS W CONTRAST  Result Date: 10/02/2021 CLINICAL DATA:  Lung cancer patient with bone metastasis. Generalized abdominal pain and low back pain status post commencement of immunotherapy. EXAM: CT ANGIOGRAPHY CHEST CT ABDOMEN AND PELVIS WITH CONTRAST TECHNIQUE: Multidetector CT imaging of the chest was performed using the standard protocol during bolus administration of intravenous contrast. Multiplanar CT image reconstructions and MIPs were obtained to evaluate the vascular anatomy. Multidetector CT imaging of the abdomen and pelvis was performed using the standard protocol during bolus administration of intravenous contrast. RADIATION DOSE REDUCTION: This exam was performed according to the departmental dose-optimization  program which includes automated exposure control, adjustment of the mA and/or kV according to patient size and/or use of iterative reconstruction technique. CONTRAST:  190m OMNIPAQUE IOHEXOL 350 MG/ML SOLN  COMPARISON:  Chest CT angiogram dated 09/15/2021. CT abdomen and pelvis dated 09/15/2021. FINDINGS: CTA CHEST FINDINGS Cardiovascular: There is no pulmonary embolism identified within the [place], lobar or central segmental pulmonary arteries bilaterally. Some of the most peripheral pulmonary artery branches to each lung are difficult to definitively characterize due to mild patient breathing motion artifact. No pericardial effusion. Aortic atherosclerosis. No evidence of aortic dissection. Mediastinum/Nodes: No mass or enlarged lymph nodes are identified within the mediastinum or perihilar regions. Esophagus is unremarkable. Trachea is unremarkable. Surgical changes at the RIGHT hilum, compatible with a previous surgical procedure/resection. Lungs/Pleura: Bilateral pulmonary nodules are not significantly changed in the short-term interval, compared to most recent chest CT of 09/15/2021. No new nodules seen. Stable small RIGHT pleural effusion, with overlying mild atelectasis. No pneumothorax. Musculoskeletal: Again noted is destructive change within the posterior eleventh rib. Again noted are multiple soft tissue masses within the soft tissues of the lateral and posterior RIGHT chest wall, not significantly changed in appearance compared to the earlier study of 09/15/2021. Review of the MIP images confirms the above findings. CT ABDOMEN and PELVIS FINDINGS Hepatobiliary: No acute or suspicious findings within the liver. Tiny hypodensities again noted within the liver, too small to definitively characterize but most likely small cysts. Gallbladder is unremarkable. No bile duct dilatation seen. Pancreas: Unremarkable. No pancreatic ductal dilatation or surrounding inflammatory changes. Spleen: Normal in size without focal abnormality. Adrenals/Urinary Tract: Adrenal glands appear normal. LEFT renal cyst. Kidneys otherwise unremarkable without suspicious mass, stone or hydronephrosis. Bladder is decompressed.  Stomach/Bowel: No dilated large or small bowel loops. No evidence of bowel wall inflammation. Appendix is normal. Stomach is unremarkable, partially decompressed. Vascular/Lymphatic: Aortic atherosclerosis. No acute-appearing vascular abnormality. No enlarged lymph nodes are seen in the abdomen or pelvis. Reproductive: Prostate is unremarkable. Other: No free fluid or abscess collection is seen within the abdomen or pelvis. No free intraperitoneal air. Musculoskeletal: No acute or suspicious osseous abnormality within the abdomen or pelvis. Review of the MIP images confirms the above findings. IMPRESSION: 1. No new findings within the chest, abdomen or pelvis. No pulmonary embolism. No evidence of pneumonia or pulmonary edema. 2. Stable small RIGHT pleural effusion, with overlying mild atelectasis. 3. Bilateral pulmonary nodules and soft tissue masses within the soft tissues of the RIGHT chest wall, as described above, not significantly changed in the short-term interval compared to the earlier study of 09/15/2021. 4. Stable destructive change within the posterior eleventh rib. 5. No acute findings within the abdomen or pelvis. No evidence of metastatic disease within the abdomen or pelvis. Aortic Atherosclerosis (ICD10-I70.0). Electronically Signed   By: SFranki CabotM.D.   On: 10/02/2021 21:27   CT Abdomen Pelvis W Contrast  Result Date: 09/15/2021 CLINICAL DATA:  Right-sided abdominal pain and flank pain. EXAM: CT ABDOMEN AND PELVIS WITH CONTRAST TECHNIQUE: Multidetector CT imaging of the abdomen and pelvis was performed using the standard protocol following bolus administration of intravenous contrast. RADIATION DOSE REDUCTION: This exam was performed according to the departmental dose-optimization program which includes automated exposure control, adjustment of the mA and/or kV according to patient size and/or use of iterative reconstruction technique. CONTRAST:  1037mOMNIPAQUE IOHEXOL 350 MG/ML SOLN  COMPARISON:  May 05, 2021 FINDINGS: Lower chest: Multiple subcentimeter noncalcified lung nodules are seen within the posterolateral aspect of the  left lung base. Mild posterior right basilar scarring and/or atelectasis is noted. A small right pleural effusion is seen. Hepatobiliary: Punctate foci of parenchymal low attenuation are seen within the anterior aspect of the liver dome and inferior aspect of the right lobe of the liver (axial CT images 10 and 32, CT series 5). These are too small to characterize by CT examination. No gallstones, gallbladder wall thickening, or biliary dilatation. Pancreas: Unremarkable. No pancreatic ductal dilatation or surrounding inflammatory changes. Spleen: Normal in size without focal abnormality. Adrenals/Urinary Tract: Adrenal glands are unremarkable. Kidneys are normal in size, without renal calculi or hydronephrosis. A stable simple cyst is seen within the anterior aspect of the mid left kidney. Bladder is unremarkable. Stomach/Bowel: Stomach is within normal limits. Appendix appears normal. No evidence of bowel wall thickening, distention, or inflammatory changes. Noninflamed diverticula are seen within the sigmoid colon. Vascular/Lymphatic: Aortic atherosclerosis. No enlarged abdominal or pelvic lymph nodes. Reproductive: The prostate gland is mildly enlarged. Other: No abdominal wall hernia or abnormality. No abdominopelvic ascites. Musculoskeletal: Multiple soft tissue masses are seen within the musculature and subcutaneous fat of the lateral and posterior aspects of the lower right chest wall. Pleural invasion is noted within the posterior aspect of the right lung base. These soft tissue masses extend along the posterior wall of the mid and upper right abdomen. This represents a new finding. Lytic lesions are seen involving multiple right ribs with a pathologic fracture noted involving the posterior eleventh right rib. Degenerative changes are seen within the lumbar  spine. IMPRESSION: 1. Multiple neoplastic soft tissue masses within the lateral and posterior aspects of the lower right chest wall with associated pleural invasion. 2. Lytic lesions involving multiple right ribs with a pathologic fracture of the posterior eleventh right rib. 3. Multiple subcentimeter noncalcified lung nodules within the left lung base, consistent with pulmonary metastatic disease. 4. Sigmoid diverticulosis. 5. Small right pleural effusion. 6. Aortic atherosclerosis. Aortic Atherosclerosis (ICD10-I70.0). Electronically Signed   By: Virgina Norfolk M.D.   On: 09/15/2021 00:57   DG Chest Portable 1 View  Result Date: 10/02/2021 CLINICAL DATA:  Metastatic lung cancer being treated with immunotherapy. Generalized abdominal pain and back pain. EXAM: PORTABLE CHEST 1 VIEW COMPARISON:  09/16/2021 FINDINGS: Previous median sternotomy and CABG. Power port in place on the right with the tip at the SVC RA junction. Previous lobectomy on the right. The lungs are presently clear. No bone lesion visible on the frontal chest radiography. Known right eleventh rib lesion not visible using this technique. Aortic atherosclerotic calcification incidentally noted. IMPRESSION: Previous lobectomy on the right.  No active disease. Electronically Signed   By: Nelson Chimes M.D.   On: 10/02/2021 19:49   MR TOTAL SPINE METS SCREENING  Result Date: 10/10/2021 CLINICAL DATA:  Screening exam for spinal metastatic disease. EXAM: MRI TOTAL SPINE WITHOUT AND WITH CONTRAST TECHNIQUE: Multisequence MR imaging of the spine from the cervical spine to the sacrum was performed prior to and following IV contrast administration for evaluation of spinal metastatic disease. CONTRAST:  8 cc of Gadavist intravenous COMPARISON:  10/02/2021 CT of the chest and abdomen. Thoracic and lumbar spine MRI 02/18/2021. FINDINGS: MRI CERVICAL SPINE FINDINGS Alignment: Mild degenerative anterolisthesis at C3-4. Vertebrae: C7-T1 ACDF with superior  endplate depression at C7. Probable solid arthrodesis at this level. Signal in the C7 vertebra on postcontrast imaging is likely from metallic susceptibility artifact and failure of fat suppression. No evidence of metastatic disease. Cord: Normal cord signal.  No abnormal intrathecal  enhancement. Posterior Fossa, vertebral arteries, paraspinal tissues: Unremarkable Disc levels: Multilevel facet spurring. C7-T1 ACDF. Mild disc bulging partially effacing ventral subarachnoid spaces at multiple levels. No degenerative cord compression. MRI THORACIC SPINE FINDINGS Alignment:  Unremarkable Vertebrae: Diffusely preserved fatty marrow. No evidence of bony metastasis, fracture, or inflammation. Known posterior right eleventh rib lesion with inter spinous tumor infiltration by CT. No continuation into the adjacent foramen or spinal canal. Cord:  Normal signal and morphology. Paraspinal and other soft tissues: Known right paravertebral tumor as evaluated by CT. Disc levels: Generalized spondylosis. MRI LUMBAR SPINE FINDINGS Segmentation:  5 lumbar type vertebrae Alignment:  Physiologic Vertebrae: No evidence of metastasis. L1 hemangioma as previously demonstrated. Conus medullaris: Extends to the L1-2 level and appears normal. No abnormal intrathecal enhancement Paraspinal and other soft tissues: No visible mass or inflammation. Disc levels: Ordinary degenerative changes as previously demonstrated. Generalized disc space narrowing and bulging. IMPRESSION: Known posterior right eleventh rib lesion with soft tissue extension. No adjacent foraminal or spinal canal infiltration. Negative for metastatic disease throughout the spine. Electronically Signed   By: Jorje Guild M.D.   On: 10/10/2021 10:41        IMPRESSION/PLAN: 1. 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 We reviewed the findings from his MRI in the temporal lobe and discussion from brain oncology conference.  The newly noted lesion was not treated previously, and Dr. Lisbeth Renshaw favors proceeding with single fraction SRS. We discussed the risks, benefits, short, and long term effects of radiotherapy, as well as the curative intent, and the patient is interested in proceeding. He will need a repeat 3T MRI for treatment planning and to confirm he is still a candidate for SRS. This is coordinated for this Friday, and simulation will take place next week. Written consent is obtained and placed in the chart, a copy was provided to the patient. Our special procedures navigator will coordinate simulation and treatment with the patient as well.   2. Neurologic symptoms of headaches, numbness, and uncontrolled RLE jerking movements. He is being evaluated by Dr. [name] and we appreciate his input for his care.  In a visit lasting *** minutes, greater than 50% of the time was spent face to face discussing the patient's condition, in preparation for the discussion, and coordinating the patient's care.    The above documentation reflects my direct findings during this shared patient visit. Please see the separate note by Dr. Lisbeth Renshaw on this date for the remainder of the patient's plan of care.     Carola Rhine, [facility]     **Disclaimer: This note was dictated with voice recognition software. Similar sounding words can inadvertently be transcribed and this note may contain transcription errors which may not have been corrected upon publication of note.**

## 2021-10-13 ENCOUNTER — Telehealth: Payer: Self-pay

## 2021-10-13 NOTE — Telephone Encounter (Signed)
Spoke w/ patient, verified identity, and reminded patient of his 12:15pm-10/14/21 (port flush) appointment and his same day 1:00pm appointment w/ Shona Simpson PA-C. I advised patient to arrive at least 14min early for check-in. I left my extension [phone] in case patient needs to call. Patient verbalized understanding of information given.

## 2021-10-13 NOTE — Telephone Encounter (Signed)
[name] called asking about his pain medication and making sure that he was taking it correctly. I advised him to take one to two tablets of Dilaudid (4-8mg ) every three hours as needed for pain. I told him that [name], NP would refill this before he runs out over the weekend. He also asked about his Gabepentin which I told him was 300mg  three times a day. He stated that he currently had the 100mg  tablets but that he has enough until he sees our office on Monday. Understanding verbalized. All questions answered. Advised to call our office with any questions/concerns.

## 2021-10-14 ENCOUNTER — Ambulatory Visit: Payer: Medicare Other | Admitting: Radiation Oncology

## 2021-10-14 ENCOUNTER — Ambulatory Visit: Payer: Medicare Other

## 2021-10-14 ENCOUNTER — Other Ambulatory Visit: Payer: Self-pay

## 2021-10-14 ENCOUNTER — Ambulatory Visit: Admission: RE | Admit: 2021-10-14 | Payer: Medicare Other | Source: Ambulatory Visit

## 2021-10-14 ENCOUNTER — Telehealth: Payer: Self-pay

## 2021-10-14 ENCOUNTER — Encounter: Payer: Self-pay | Admitting: Radiation Oncology

## 2021-10-14 ENCOUNTER — Ambulatory Visit
Admission: RE | Admit: 2021-10-14 | Discharge: 2021-10-14 | Disposition: A | Discharge status: Home / Self Care | Payer: Medicare Other | Source: Ambulatory Visit | Attending: Radiation Oncology | Admitting: Radiation Oncology

## 2021-10-14 ENCOUNTER — Other Ambulatory Visit: Payer: Self-pay | Admitting: Physician Assistant

## 2021-10-14 ENCOUNTER — Inpatient Hospital Stay (HOSPITAL_BASED_OUTPATIENT_CLINIC_OR_DEPARTMENT_OTHER): Payer: Medicare Other

## 2021-10-14 VITALS — BP 128/75 | HR 107 | Temp 96.5°F | Resp 18 | Ht 69.0 in | Wt 173.5 lb

## 2021-10-14 DIAGNOSIS — Z5112 Encounter for antineoplastic immunotherapy: Secondary | ICD-10-CM | POA: Diagnosis present

## 2021-10-14 DIAGNOSIS — Z95828 Presence of other vascular implants and grafts: Secondary | ICD-10-CM

## 2021-10-14 DIAGNOSIS — Z79899 Other long term (current) drug therapy: Secondary | ICD-10-CM | POA: Diagnosis not present

## 2021-10-14 DIAGNOSIS — C3491 Malignant neoplasm of unspecified part of right bronchus or lung: Secondary | ICD-10-CM

## 2021-10-14 DIAGNOSIS — Z5111 Encounter for antineoplastic chemotherapy: Secondary | ICD-10-CM | POA: Diagnosis present

## 2021-10-14 DIAGNOSIS — R5382 Chronic fatigue, unspecified: Secondary | ICD-10-CM

## 2021-10-14 DIAGNOSIS — C7A1 Malignant poorly differentiated neuroendocrine tumors: Secondary | ICD-10-CM

## 2021-10-14 DIAGNOSIS — C7931 Secondary malignant neoplasm of brain: Secondary | ICD-10-CM | POA: Diagnosis not present

## 2021-10-14 LAB — CBC WITH DIFFERENTIAL (CANCER CENTER ONLY)
Abs Immature Granulocytes: 0.03 10*3/uL (ref 0.00–0.07)
Basophils Absolute: 0 10*3/uL (ref 0.0–0.1)
Basophils Relative: 1 %
Eosinophils Absolute: 0.1 10*3/uL (ref 0.0–0.5)
Eosinophils Relative: 6 %
HCT: 26.7 % — ABNORMAL LOW (ref 39.0–52.0)
Hemoglobin: 8.9 g/dL — ABNORMAL LOW (ref 13.0–17.0)
Immature Granulocytes: 1 %
Lymphocytes Relative: 32 %
Lymphs Abs: 0.7 10*3/uL (ref 0.7–4.0)
MCH: 30.3 pg (ref 26.0–34.0)
MCHC: 33.3 g/dL (ref 30.0–36.0)
MCV: 90.8 fL (ref 80.0–100.0)
Monocytes Absolute: 0.5 10*3/uL (ref 0.1–1.0)
Monocytes Relative: 25 %
Neutro Abs: 0.8 10*3/uL — ABNORMAL LOW (ref 1.7–7.7)
Neutrophils Relative %: 35 %
Platelet Count: 39 10*3/uL — ABNORMAL LOW (ref 150–400)
RBC: 2.94 MIL/uL — ABNORMAL LOW (ref 4.22–5.81)
RDW: 15.5 % (ref 11.5–15.5)
WBC Count: 2.2 10*3/uL — ABNORMAL LOW (ref 4.0–10.5)
nRBC: 0 % (ref 0.0–0.2)

## 2021-10-14 LAB — CMP (CANCER CENTER ONLY)
ALT: 23 U/L (ref 0–44)
AST: 25 U/L (ref 15–41)
Albumin: 3.6 g/dL (ref 3.5–5.0)
Alkaline Phosphatase: 84 U/L (ref 38–126)
Anion gap: 5 (ref 5–15)
BUN: 15 mg/dL (ref 8–23)
CO2: 27 mmol/L (ref 22–32)
Calcium: 9.1 mg/dL (ref 8.9–10.3)
Chloride: 104 mmol/L (ref 98–111)
Creatinine: 0.87 mg/dL (ref 0.61–1.24)
GFR, Estimated: >60 mL/min (ref >60)
Glucose, Bld: 92 mg/dL (ref 70–99)
Potassium: 4.1 mmol/L (ref 3.5–5.1)
Sodium: 136 mmol/L (ref 135–145)
Total Bilirubin: 0.5 mg/dL (ref 0.3–1.2)
Total Protein: 6.7 g/dL (ref 6.5–8.1)

## 2021-10-14 LAB — MAGNESIUM: Magnesium: 1.5 mg/dL — ABNORMAL LOW (ref 1.7–2.4)

## 2021-10-14 MED ORDER — HEPARIN SOD (PORK) LOCK FLUSH 100 UNIT/ML IV SOLN
500.0000 [IU] | Freq: Once | INTRAVENOUS | Status: AC
  Administered 2021-10-14: 500 [IU]

## 2021-10-14 MED ORDER — SODIUM CHLORIDE 0.9% FLUSH
10.0000 mL | Freq: Once | INTRAVENOUS | Status: AC
  Administered 2021-10-14: 10 mL

## 2021-10-14 MED ORDER — MAGNESIUM OXIDE -MG SUPPLEMENT 400 (240 MG) MG PO TABS: 400.0000 mg | ORAL_TABLET | Freq: Every day | ORAL | 1 refills | Status: DC

## 2021-10-14 NOTE — Addendum Note (Signed)
Encounter addended by: Mollie Germany, LPN on: 5/57/3220 2:54 PM  Actions taken: Medication List reviewed, Problem List reviewed, Allergies reviewed, Flowsheet accepted, Clinical Note Signed, Order list changed

## 2021-10-14 NOTE — Addendum Note (Signed)
Encounter addended by: Hayden Pedro, PA-C on: 10/14/2021 4:14 PM  Actions taken: Clinical Note Signed, Level of Service modified

## 2021-10-14 NOTE — Progress Notes (Signed)
Spoke w/ patient, verified identity and begin nursing interview. Patient reports headache 4/10, rib pain 7/10, occasional nausea and general fatigue. No other issues reported ta this time.  Meaningful use complete.  BP 128/75 (BP Location: Left Arm, Patient Position: Sitting)    Pulse (!) 107    Temp (!) 96.5 F (35.8 C) (Temporal)    Resp 18    Ht 5\' 9"  (1.753 m)    Wt 173 lb 8 oz (78.7 kg)    SpO2 100%    BMI 25.62 kg/m

## 2021-10-14 NOTE — Addendum Note (Signed)
Encounter addended by: Hayden Pedro, PA-C on: 10/14/2021 1:35 PM  Actions taken: Visit diagnoses modified

## 2021-10-14 NOTE — Telephone Encounter (Signed)
Pt called advising that he has started having pain and numbness  in his left arm and shoulder radiating down to elbow and ring finger. He reports intermittent numbness of finger. This began 10/13/21. Patient has questions about last MRI and plan moving forward.  Patient has apt with Ashok Cordia PA today to discuss.  Patient requested that Dr. [name] be updated on new arm pain.

## 2021-10-14 NOTE — Progress Notes (Signed)
Radiation Oncology         [phone]) [phone] ________________________________  Outpatient Follow Up    Name: [name]        MRN: [id number]  Date of Service: 10/14/2021 DOB: 07/23/1956  QI:[id number], Annie Main, MD  Ventura Sellers, MD     REFERRING PHYSICIAN: Ventura Sellers, MD   DIAGNOSIS: The primary encounter diagnosis was Metastasis to brain [facility]). A diagnosis of Secondary malignant neoplasm of brain [facility]) was also pertinent to this visit.   HISTORY OF PRESENT ILLNESS: [name] is a 65 y.o. 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 The patient was diagnosed in April 2022 with his lung carcinoma at the time of undergoing right upper lobectomy. Final pathology showed nodal involvement of the hilar lymph node, and a primary tumor measuring 2.4 cm, margins were negative.  He started cisplatin and etoposide given the neuroendocrine features.  He underwent MRI of the brain on 01/28/2020 which revealed an enhancing 6.5 mm lesion in the left lateral cerebellum consistent with metastasis.  Given these findings, he was worked up for and proceeded with stereotactic radiosurgery [facility]) which he completed in June 2022.   He developed local chest wall recurrence and underwent surgical resection on 07/29/21. The resection was consistent with large cell neuroendocrine carcinoma with cauterized tumor at the resection margin.  He was then treated in December 2022  for 4 lesions seen in the brain with single fraction radiotherapy.  This treatment was completed on 08/26/2021.  He had also developed a right chest wall recurrence of his cancer and received a palliative course of radiotherapy as well.  He has been under the care of the palliative care team due to his pain.  He is seen today as a symptom of headache prompted an MRI of the brain on 10/08/2021 and our discussion he had also seen Dr. [name] and had an MRI of the spine on  10/10/2021 that was negative for disease in the spinal canal or column.  From discussion however in brain and spine conference the patient does have a new punctate lesion in the left thalamic region measuring up to 5 mm.  Recommendations were made to proceed with stereotactic radiosurgery [facility]) and for this reason he is seen to discuss this treatment.  Of note he did begin systemic Keytruda, carboplatin and Alimta on 09/29/2021.  He is scheduled for his next dose on 10/19/2021.    PREVIOUS RADIATION THERAPY:   09/01/2021 through 09/23/2021 Site Technique Total Dose (Gy) Dose per Fx (Gy) Completed Fx Beam Energies  Chest Wall, Right: CW_R 3D 37.5/37.5 2.5 15/15 6X   08/26/2021 through 08/26/2021 SRS Treatment Site Technique Total Dose (Gy) Dose per Fx (Gy) Completed Fx Beam Energies  Brain:    PTV_6_InsulaR_58mm PTV_5_PostFrontR_42mm PTV_4_ParietL_69mm PTV_3_FrontL_67mm IMRT 20/20 20 1/1 6XFFF   02/13/2021 through 02/13/2021 SRS Treatment: Site Technique Total Dose (Gy) Dose per Fx (Gy) Completed Fx Beam Energies  Brain: PTV_1CerebLt4mm 3D 20/20 20 1/1 6XFFF  Brain: PTV_2 Lt Frontal 69mm 3D 20/20 20 1/1 6XFFF     PAST MEDICAL HISTORY:  Past Medical History:  Diagnosis Date   Anemia    Anxiety    Arthritis    Basal cell carcinoma (BCC) of forehead    CAD (coronary artery disease)    a. 10/2015 ant STEMI >> LHC with 3 v CAD; oLAD tx with POBA >> emergent CABG. b. Multiple evals since that time, early  graft failure of SVG-RCA by cath 03/2016. c. 2/19 PCI/DES x1 to pRCA, normal EF.   Carotid artery disease ([place])    a. 40-59% BICA 02/2018.   Depression    Dyspnea    Ectopic atrial tachycardia (HCC)    Esophageal reflux    eosinophil esophagitis   Family history of adverse reaction to anesthesia    "sister has PONV" (06/21/2017)   Former tobacco use    Gout    Hepatitis C    "treated and cured" (06/21/2017)   High cholesterol    History of blood transfusion    History of kidney stones     Hypertension    Ischemic cardiomyopathy    a. EF 25-30% at intraop TEE 4/17  //  b. Limited Echo 5/17 - EF 45-50%, mild ant HK. c. EF 55-65% by cath 09/2017.   Migraine    "3-4/yr" (06/21/2017)   Myocardial infarction ([place]) 10/2015   Palpitations    Pneumonia    Sinus bradycardia    a. HR dropping into 40s in 02/2016 -> BB reduced.   Stroke [facility]) 10/2016   "small one; sometimes my memory/cognitive issues" (06/21/2017)   Symptomatic hypotension    a. 02/2016 ER visit -> meds reduced.   Syncope    Wears dentures    Wears glasses        PAST SURGICAL HISTORY: Past Surgical History:  Procedure Laterality Date   25 GAUGE PARS PLANA VITRECTOMY WITH 20 GAUGE MVR PORT  12/31/2020   Procedure: 25 GAUGE PARS PLANA VITRECTOMY WITH 20 GAUGE MVR PORT;  Surgeon: [name], MD;  Location: [place];  Service: Thoracic;;   ANTERIOR CERVICAL DECOMP/DISCECTOMY FUSION N/A 10/17/2018   Procedure: Anterior Cervical Decompression Fusion - Cervical seven -Thoracic one;  Surgeon: Consuella Lose, MD;  Location: [place];  Service: Neurosurgery;  Laterality: N/A;   BASAL CELL CARCINOMA EXCISION     "forehead   BIOPSY  07/20/2019   Procedure: BIOPSY;  Surgeon: Carol Ada, MD;  Location: [facility];  Service: Endoscopy;;   BIOPSY OF MEDIASTINAL MASS Right 07/29/2021   Procedure: RIGHT CHEST WALL MASS BIOPSY;  Surgeon: [name], MD;  Location: [place];  Service: Thoracic;  Laterality: Right;   BRONCHIAL BIOPSY  12/24/2020   Procedure: BRONCHIAL BIOPSIES;  Surgeon: Garner Nash, DO;  Location: [place] [facility];  Service: Pulmonary;;   BRONCHIAL BRUSHINGS  12/24/2020   Procedure: BRONCHIAL BRUSHINGS;  Surgeon: Garner Nash, DO;  Location: [place];  Service: Pulmonary;;   BRONCHIAL NEEDLE ASPIRATION BIOPSY  12/24/2020   Procedure: BRONCHIAL NEEDLE ASPIRATION BIOPSIES;  Surgeon: Garner Nash, DO;  Location: [place];  Service: Pulmonary;;   CARDIAC CATHETERIZATION N/A  11/28/2015   Procedure: Left Heart Cath and Coronary Angiography;  Surgeon: [name], MD;  Location: [place] CV [facility];  Service: Cardiovascular;  Laterality: N/A;   CARDIAC CATHETERIZATION N/A 11/28/2015   Procedure: Coronary Balloon Angioplasty;  Surgeon: [name], MD;  Location: [place] CV [facility];  Service: Cardiovascular;  Laterality: N/A;  ostial LAD   CARDIAC CATHETERIZATION N/A 11/28/2015   Procedure: Coronary/Graft Angiography;  Surgeon: [name], MD;  Location: [place] CV LAB;  Service: Cardiovascular;  Laterality: N/A;  coronaries only    CARDIAC CATHETERIZATION N/A 04/21/2016   Procedure: Left Heart Cath and Coronary Angiography;  Surgeon: Wellington Hampshire, MD;  Location: [place] CV LAB;  Service: Cardiovascular;  Laterality: N/A;   CARDIAC CATHETERIZATION N/A 06/14/2016   Procedure:  Left Heart Cath and Cors/Grafts Angiography;  Surgeon: Lorretta Harp, MD;  Location: [place] CV LAB;  Service: Cardiovascular;  Laterality: N/A;   CARDIAC CATHETERIZATION N/A 09/08/2016   Procedure: Left Heart Cath and Cors/Grafts Angiography;  Surgeon: Wellington Hampshire, MD;  Location: [place] CV LAB;  Service: Cardiovascular;  Laterality: N/A;   CARDIAC CATHETERIZATION     CORONARY ARTERY BYPASS GRAFT N/A 11/28/2015   Procedure: CORONARY ARTERY BYPASS GRAFTING (CABG) TIMES FIVE USING LEFT INTERNAL MAMMARY ARTERY AND RIGHT GREATER SAPHENOUS,VIEN HARVEATED BY ENDOVIEN, INTRAOPPRATIVE TEE;  Surgeon: Gaye Pollack, MD;  Location: [place];  Service: Open Heart Surgery;  Laterality: N/A;   CORONARY STENT INTERVENTION N/A 10/05/2017   Procedure: CORONARY STENT INTERVENTION;  Surgeon: [name], MD;  Location: [place] CV LAB;  Service: Cardiovascular;  Laterality: N/A;   ESOPHAGOGASTRODUODENOSCOPY (EGD) WITH PROPOFOL N/A 07/20/2019   Procedure: ESOPHAGOGASTRODUODENOSCOPY (EGD) WITH PROPOFOL;  Surgeon: Carol Ada, MD;  [facility];  Service:  Endoscopy;  Laterality: N/A;   ESOPHAGOGASTRODUODENOSCOPY (EGD) WITH PROPOFOL N/A 01/30/2021   Procedure: ESOPHAGOGASTRODUODENOSCOPY (EGD) WITH PROPOFOL;  Surgeon: Carol Ada, MD;  [facility];  Service: Endoscopy;  Laterality: N/A;   FIDUCIAL MARKER PLACEMENT  12/24/2020   Procedure: FIDUCIAL DYE MARKER PLACEMENT;  Surgeon: Garner Nash, DO;  Location: [place] [facility]COPY;  Service: Pulmonary;;   HUMERUS SURGERY Right 1969   "tumor inside bone; filled it w/bone chips"   INTERCOSTAL NERVE BLOCK Right 12/24/2020   Procedure: INTERCOSTAL NERVE BLOCK;  Surgeon: [name], MD;  Location: [place];  Service: Thoracic;  Laterality: Right;   IR IMAGING GUIDED PORT INSERTION  02/06/2021   LEFT HEART CATH AND CORS/GRAFTS ANGIOGRAPHY N/A 03/11/2017   Procedure: Left Heart Cath and Cors/Grafts Angiography;  Surgeon: Leonie Man, MD;  Location: [place] CV LAB;  Service: Cardiovascular;  Laterality: N/A;   LEFT HEART CATH AND CORS/GRAFTS ANGIOGRAPHY N/A 10/05/2017   Procedure: LEFT HEART CATH AND CORS/GRAFTS ANGIOGRAPHY;  Surgeon: [name], MD;  Location: [place] CV [facility];  Service: Cardiovascular;  Laterality: N/A;   LEFT HEART CATH AND CORS/GRAFTS ANGIOGRAPHY N/A 04/11/2019   Procedure: LEFT HEART CATH AND CORS/GRAFTS ANGIOGRAPHY;  Surgeon: [name], MD;  Location: [place] [facility] LAB;  Service: Cardiovascular;  Laterality: N/A;   NODE DISSECTION Right 12/24/2020   Procedure: NODE DISSECTION;  Surgeon: [name], MD;  Location: [place];  Service: Thoracic;  Laterality: Right;   PERIPHERAL VASCULAR CATHETERIZATION N/A 06/14/2016   Procedure: Lower Extremity Angiography;  Surgeon: Lorretta Harp, MD;  Location: [place] CV [facility];  Service: Cardiovascular;  Laterality: N/A;   VIDEO BRONCHOSCOPY WITH ENDOBRONCHIAL NAVIGATION Right 12/24/2020   Procedure: VIDEO BRONCHOSCOPY WITH ENDOBRONCHIAL NAVIGATION;  Surgeon: Garner Nash, DO;  Location: [place];  Service: Pulmonary;  Laterality: Right;   VIDEO BRONCHOSCOPY WITH ENDOBRONCHIAL ULTRASOUND N/A 12/24/2020   Procedure: VIDEO BRONCHOSCOPY WITH ENDOBRONCHIAL ULTRASOUND;  Surgeon: Garner Nash, DO;  Location: [place];  Service: Pulmonary;  Laterality: N/A;   VIDEO BRONCHOSCOPY WITH INSERTION OF INTERBRONCHIAL VALVE (IBV) N/A 12/31/2020   Procedure: VIDEO BRONCHOSCOPY WITH INSERTION OF INTERBRONCHIAL VALVE (IBV).VALVE IN CARTRIDGE 70mm,9mm. CHEST TUBE PLACEMENT.;  Surgeon: [name], MD;  [facility];  Service: Thoracic;  Laterality: N/A;   VIDEO BRONCHOSCOPY WITH INSERTION OF INTERBRONCHIAL VALVE (IBV) N/A 07/29/2021   Procedure: VIDEO BRONCHOSCOPY WITH REMOVAL OF INTERBRONCHIAL VALVE (IBV);  Surgeon: [name], MD;  Location: [place];  Service: Thoracic;  Laterality: N/A;     FAMILY HISTORY:  Family History  Problem Relation Age of Onset   Lung cancer Mother    Heart Problems Father    Heart attack Father 43   Stroke Father    Heart failure Father    Heart attack Maternal Grandmother    Stroke Maternal Grandmother    Heart attack Paternal Uncle    Hypertension Brother    Autoimmune disease Neg Hx      SOCIAL HISTORY:  reports that he quit smoking about 5 years ago. His smoking use included cigarettes. He has a 33.00 pack-year smoking history. He has never used smokeless tobacco. He reports that he does not currently use alcohol. He reports that he does not use drugs.  The patient is married and lives in [place].  He owns a [employer].   ALLERGIES: Prednisone, Tetanus toxoids, Wellbutrin [bupropion], and Varenicline   MEDICATIONS:  Current Outpatient Medications  Medication Sig Dispense Refill   acetaminophen (TYLENOL) 500 MG tablet Take 1,000 mg by mouth every 6 (six) hours as needed for mild pain, fever or headache.     aspirin EC 81 MG tablet Take 81 mg by mouth daily. Swallow whole.     docusate sodium (COLACE) 100 MG capsule  Take 100-200 mg by mouth daily.     DULoxetine (CYMBALTA) 30 MG capsule Take 1 capsule (30 mg total) by mouth 2 (two) times daily. 60 capsule 1   folic acid (FOLVITE) 1 MG tablet Take 1 tablet (1 mg total) by mouth daily. 30 tablet 4   gabapentin (NEURONTIN) 300 MG capsule Take 1 capsule (300 mg total) by mouth 3 (three) times daily.     HYDROmorphone (DILAUDID) 4 MG tablet Take 1-2 tablets (4-8 mg total) by mouth every 3 (three) hours as needed for severe pain. 120 tablet 0   lidocaine (LIDODERM) 5 % Place 1 patch onto the skin daily as needed (for pain- Remove & Discard patch within 12 hours or as directed by MD).     lidocaine-prilocaine (EMLA) cream Apply to the Port-A-Cath site 30-60-minute before treatment (Patient taking differently: Apply 1 application topically See admin instructions. Apply to the Port-A-Cath site 30-60-minutes before treatment) 30 g 0   magnesium oxide (MAG-OX) 400 (240 Mg) MG tablet Take 1 tablet (400 mg total) by mouth daily. 30 tablet 1   metoprolol succinate (TOPROL-XL) 25 MG 24 hr tablet Take 1 tablet (25 mg total) by mouth daily. (Patient taking differently: Take 25 mg by mouth every evening.) 90 tablet 3   midodrine (PROAMATINE) 2.5 MG tablet Take 2.5 mg by mouth 3 (three) times daily as needed (AS DIRECTED).     morphine (MS CONTIN) 60 MG 12 hr tablet Take 1 tablet (60 mg total) by mouth every 8 (eight) hours. **10/02/2021 90 tablet 0   Multiple Vitamin (MULTIVITAMIN WITH MINERALS) TABS tablet Take 1 tablet by mouth daily.     ondansetron (ZOFRAN) 8 MG tablet Take 1 tablet (8 mg total) by mouth every 8 (eight) hours as needed for nausea or vomiting. Starting 3 days after chemotherapy 90 tablet 2   pantoprazole (PROTONIX) 40 MG tablet Take 1 tablet by mouth daily at 6 AM. 30 tablet 1   polyethylene glycol (MIRALAX / GLYCOLAX) 17 g packet Take 17 g by mouth daily. (Patient not taking: Reported on 10/02/2021) 14 each 0   prochlorperazine (COMPAZINE) 10 MG tablet Take 1  tablet (10 mg total) by mouth every 6 (six) hours  as needed for nausea or vomiting. 30 tablet 0   ranolazine (RANEXA) 1000 MG SR tablet Take 1 tablet (1,000 mg total) by mouth 2 (two) times daily. 180 tablet 3   senna-docusate (SENOKOT-S) 8.6-50 MG tablet Take 1 tablet by mouth 2 (two) times daily.     tiZANidine (ZANAFLEX) 2 MG tablet Take 1 tablet (2 mg total) by mouth at bedtime as needed for muscle spasms. (Patient taking differently: Take 2 mg by mouth at bedtime.) 30 tablet 0   XARELTO 20 MG TABS tablet Take 20 mg by mouth every evening.     No current facility-administered medications for this encounter.     REVIEW OF SYSTEMS: On review of systems, the patient reports that he is doing okay. He continues to have neuropathic pain in his chest wall and also persistent pain in his extremities, especially the left upper and lower extremities. He feels this is worse since his last visit with Dr. [name]. He's curious about next steps given his negative MRI of the spine. No other complaints are noted.   PHYSICAL EXAM:  Unable to assess given encounter type.   ECOG = 1  0 - Asymptomatic (Fully active, able to carry on all predisease activities without restriction)  1 - Symptomatic but completely ambulatory (Restricted in physically strenuous activity but ambulatory and able to carry out work of a light or sedentary nature. For example, light housework, office work)  2 - Symptomatic, <50% in bed during the day (Ambulatory and capable of all self care but unable to carry out any work activities. Up and about more than 50% of waking hours)  3 - Symptomatic, >50% in bed, but not bedbound (Capable of only limited self-care, confined to bed or chair 50% or more of waking hours)  4 - Bedbound (Completely disabled. Cannot carry on any self-care. Totally confined to bed or chair)  5 - Death   Eustace Pen MM, Creech RH, Tormey DC, et al. [phone]). "Toxicity and response criteria of the [facility]". [place] Oncol. 5 (6): 649-55    LABORATORY DATA:  Lab Results  Component Value Date   WBC 2.2 (L) 10/14/2021   HGB 8.9 (L) 10/14/2021   HCT 26.7 (L) 10/14/2021   MCV 90.8 10/14/2021   PLT 39 (L) 10/14/2021   Lab Results  Component Value Date   NA 136 10/14/2021   K 4.1 10/14/2021   CL 104 10/14/2021   CO2 27 10/14/2021   Lab Results  Component Value Date   ALT 23 10/14/2021   AST 25 10/14/2021   ALKPHOS 84 10/14/2021   BILITOT 0.5 10/14/2021      RADIOGRAPHY: DG Chest 2 View  Result Date: 09/14/2021 CLINICAL DATA:  Right lower abdominal pain with cough and nausea. EXAM: CHEST - 2 VIEW COMPARISON:  August 29, 2021 FINDINGS: There is stable right-sided venous Port-A-Cath positioning. Multiple sternal wires and vascular clips are seen. Mild atelectasis is noted within the right lung base. A small right pleural effusion is present. No pneumothorax is identified. The heart size and mediastinal contours are within normal limits. A radiopaque fusion plate and screws are seen overlying the lower cervical spine. The visualized skeletal structures are otherwise unremarkable. IMPRESSION: 1. Stable postoperative changes with mild right basilar atelectasis. 2. Small right pleural effusion. Electronically Signed   By: Virgina Norfolk M.D.   On: 09/14/2021 23:17   DG Ribs Unilateral W/Chest Right  Result Date: 09/16/2021 CLINICAL DATA:  Right posterior rib fracture,  right flank pain, history of metastatic disease EXAM: RIGHT RIBS AND CHEST - 3+ VIEW COMPARISON:  09/15/2021, 09/14/2021 FINDINGS: Frontal view of the chest as well as frontal and oblique views of the right thoracic cage are obtained. Cardiac silhouette is stable. Postsurgical changes from median sternotomy. Right chest wall port unchanged. Small right pleural effusion.  No airspace disease or pneumothorax. Lytic lesion posterior right eleventh rib with associated pathologic fracture identified, better  visualized on recent CT. The other smaller lytic lesions throughout the remainder of the thoracic cage are not as well visualized by radiograph. IMPRESSION: 1. Lytic lesion and pathologic fracture posterior right eleventh rib. 2. Small right pleural effusion. 3. Please refer to recent CT report describing numerous other small lytic lesions within the right thoracic cage. Electronically Signed   By: Randa Ngo M.D.   On: 09/16/2021 22:02   CT Angio Chest PE W and/or Wo Contrast  Result Date: 10/02/2021 CLINICAL DATA:  Lung cancer patient with bone metastasis. Generalized abdominal pain and low back pain status post commencement of immunotherapy. EXAM: CT ANGIOGRAPHY CHEST CT ABDOMEN AND PELVIS WITH CONTRAST TECHNIQUE: Multidetector CT imaging of the chest was performed using the standard protocol during bolus administration of intravenous contrast. Multiplanar CT image reconstructions and MIPs were obtained to evaluate the vascular anatomy. Multidetector CT imaging of the abdomen and pelvis was performed using the standard protocol during bolus administration of intravenous contrast. RADIATION DOSE REDUCTION: This exam was performed according to the departmental dose-optimization program which includes automated exposure control, adjustment of the mA and/or kV according to patient size and/or use of iterative reconstruction technique. CONTRAST:  114mL OMNIPAQUE IOHEXOL 350 MG/ML SOLN COMPARISON:  Chest CT angiogram dated 09/15/2021. CT abdomen and pelvis dated 09/15/2021. FINDINGS: CTA CHEST FINDINGS Cardiovascular: There is no pulmonary embolism identified within the main, lobar or central segmental pulmonary arteries bilaterally. Some of the most peripheral pulmonary artery branches to each lung are difficult to definitively characterize due to mild patient breathing motion artifact. No pericardial effusion. Aortic atherosclerosis. No evidence of aortic dissection. Mediastinum/Nodes: No mass or enlarged  lymph nodes are identified within the mediastinum or perihilar regions. Esophagus is unremarkable. Trachea is unremarkable. Surgical changes at the RIGHT hilum, compatible with a previous surgical procedure/resection. Lungs/Pleura: Bilateral pulmonary nodules are not significantly changed in the short-term interval, compared to most recent chest CT of 09/15/2021. No new nodules seen. Stable small RIGHT pleural effusion, with overlying mild atelectasis. No pneumothorax. Musculoskeletal: Again noted is destructive change within the posterior eleventh rib. Again noted are multiple soft tissue masses within the soft tissues of the lateral and posterior RIGHT chest wall, not significantly changed in appearance compared to the earlier study of 09/15/2021. Review of the MIP images confirms the above findings. CT ABDOMEN and PELVIS FINDINGS Hepatobiliary: No acute or suspicious findings within the liver. Tiny hypodensities again noted within the liver, too small to definitively characterize but most likely small cysts. Gallbladder is unremarkable. No bile duct dilatation seen. Pancreas: Unremarkable. No pancreatic ductal dilatation or surrounding inflammatory changes. Spleen: Normal in size without focal abnormality. Adrenals/Urinary Tract: Adrenal glands appear normal. LEFT renal cyst. Kidneys otherwise unremarkable without suspicious mass, stone or hydronephrosis. Bladder is decompressed. Stomach/Bowel: No dilated large or small bowel loops. No evidence of bowel wall inflammation. Appendix is normal. Stomach is unremarkable, partially decompressed. Vascular/Lymphatic: Aortic atherosclerosis. No acute-appearing vascular abnormality. No enlarged lymph nodes are seen in the abdomen or pelvis. Reproductive: Prostate is unremarkable. Other: No free fluid or abscess  collection is seen within the abdomen or pelvis. No free intraperitoneal air. Musculoskeletal: No acute or suspicious osseous abnormality within the abdomen or  pelvis. Review of the MIP images confirms the above findings. IMPRESSION: 1. No new findings within the chest, abdomen or pelvis. No pulmonary embolism. No evidence of pneumonia or pulmonary edema. 2. Stable small RIGHT pleural effusion, with overlying mild atelectasis. 3. Bilateral pulmonary nodules and soft tissue masses within the soft tissues of the RIGHT chest wall, as described above, not significantly changed in the short-term interval compared to the earlier study of 09/15/2021. 4. Stable destructive change within the posterior eleventh rib. 5. No acute findings within the abdomen or pelvis. No evidence of metastatic disease within the abdomen or pelvis. Aortic Atherosclerosis (ICD10-I70.0). Electronically Signed   By: Franki Cabot M.D.   On: 10/02/2021 21:27   CT Angio Chest PE W/Cm &/Or Wo Cm  Result Date: 09/15/2021 CLINICAL DATA:  Dyspnea with right-sided abdominal pain and flank pain. EXAM: CT ANGIOGRAPHY CHEST WITH CONTRAST TECHNIQUE: Multidetector CT imaging of the chest was performed using the standard protocol during bolus administration of intravenous contrast. Multiplanar CT image reconstructions and MIPs were obtained to evaluate the vascular anatomy. RADIATION DOSE REDUCTION: This exam was performed according to the departmental dose-optimization program which includes automated exposure control, adjustment of the mA and/or kV according to patient size and/or use of iterative reconstruction technique. CONTRAST:  17mL OMNIPAQUE IOHEXOL 350 MG/ML SOLN COMPARISON:  August 18, 2021 FINDINGS: Cardiovascular: A right-sided venous Port-A-Cath is in place. There is mild to moderate severity calcification of the aortic arch. Satisfactory opacification of the pulmonary arteries to the segmental level. No evidence of pulmonary embolism. Normal heart size. No pericardial effusion. Mediastinum/Nodes: No enlarged mediastinal, hilar, or axillary lymph nodes. Thyroid gland, trachea, and esophagus  demonstrate no significant findings. Lungs/Pleura: A 12 mm x 11 mm noncalcified lung nodule is seen within the posteromedial aspect of the left upper lobe (axial CT image 29, CT series 4). This measures approximately 11 mm x 9 mm on the prior study. 6 mm and 4 mm noncalcified lung nodules are seen within the posterior aspect of the right upper lobe (axial CT images 37 and 46, CT series 2). These are new findings when compared to the prior study. New 3 mm and 6 mm noncalcified lung nodules are seen within the posterolateral aspects of the right lower lobe (axial CT images 52, 59 and 66, CT series 4). Mild atelectatic changes are seen within the posterior aspect of the right lung base. A very small right pleural effusion is seen. No pneumothorax is identified. Upper Abdomen: A stable simple cyst is seen within the left kidney. Musculoskeletal: Multiple sternal wires are present. Lytic lesions are again seen involving multiple right ribs. A pathologic fracture of the posterior aspect of the eleventh right rib is again seen. There has been interval enlargement of the soft tissue lesions seen within the posterior right chest wall with pleural invasion along the posteromedial aspect of the right lung base. A metallic density fixation plate and screws are seen along the anterior aspect of the lower cervical spine. The level degenerative changes are seen throughout the thoracic spine. Review of the MIP images confirms the above findings. IMPRESSION: 1. No evidence for pulmonary embolism. 2. Very mild interval increase in size of the previously noted left upper lobe lung nodule, with multiple new bilateral noncalcified lung nodules, consistent with pulmonary metastasis. 3. Very small right pleural effusion. 4. Interval enlargement of  the soft tissue mass is seen within the posterior right chest wall with pleural invasion along right lung base. 5. Multiple right-sided lytic lesions with a pathologic fracture of the posterior  eleventh right rib. Aortic Atherosclerosis (ICD10-I70.0). Electronically Signed   By: Virgina Norfolk M.D.   On: 09/15/2021 00:49   MR BRAIN W WO CONTRAST  Addendum Date: 10/13/2021   ADDENDUM REPORT: 10/13/2021 13:17 ADDENDUM: The current MRI brain was discussed at Brain tumor Conference. Further detail was requested regarding the lesion in the left globus pallidus. This lesion enhances and currently measures 5 mm in diameter. This lesion shows interval growth since the MRI of 08/14/2021 when the lesion measured approximately 2 mm in diameter. Lesion is not seen in September of 2022 MRI. Chronic hemorrhage is seen in the lesion on the current study and in December but not present on May 15, 2021 MRI. Electronically Signed   By: Franchot Gallo M.D.   On: 10/13/2021 13:17   Result Date: 10/13/2021 CLINICAL DATA:  Sudden onset severe headache. History of lung cancer with metastatic to brain. Post radiation and chemo. EXAM: MRI HEAD WITHOUT AND WITH CONTRAST TECHNIQUE: Multiplanar, multiecho pulse sequences of the brain and surrounding structures were obtained without and with intravenous contrast. CONTRAST:  61mL GADAVIST GADOBUTROL 1 MMOL/ML IV SOLN COMPARISON:  MRI head with contrast 08/14/2021 FINDINGS: Brain: Multiple small metastatic deposits in the brain. Mixed response to treatment with several lesions larger and most lesions smaller. Larger lesions: Left cerebellar 4 mm lesion axial image 33 appears larger with new area of surrounding edema. Associated chronic hemorrhage has progressed. This is a treated lesion and enlargement could be related to tumor growth or treatment effect. Enhancing lesion in the left globus pallidus region measures 5 mm and is increased in size. Associated chronic hemorrhage is unchanged. No edema. Stable or smaller lesions: 3 mm lesion right posterior insula axial image 86 slightly smaller 2 mm lesion left parietal cortex axial image 128 smaller 3 mm lesion left frontal  cortex axial image 129 is smaller 3 mm lesion right frontal cortex over the convexity is slightly smaller. Axial image 143 Ventricle size normal.  Negative for acute infarct. Vascular: Normal arterial flow voids Skull and upper cervical spine: No suspicious skeletal lesion. Sinuses/Orbits: Paranasal sinuses clear.  Negative orbit Other: None IMPRESSION: Multiple small metastatic deposits to the brain. Mixed response with 2 lesions mildly larger and the remaining lesions stable or smaller. Electronically Signed: By: Franchot Gallo M.D. On: 10/08/2021 12:27   CT ABDOMEN PELVIS W CONTRAST  Result Date: 10/02/2021 CLINICAL DATA:  Lung cancer patient with bone metastasis. Generalized abdominal pain and low back pain status post commencement of immunotherapy. EXAM: CT ANGIOGRAPHY CHEST CT ABDOMEN AND PELVIS WITH CONTRAST TECHNIQUE: Multidetector CT imaging of the chest was performed using the standard protocol during bolus administration of intravenous contrast. Multiplanar CT image reconstructions and MIPs were obtained to evaluate the vascular anatomy. Multidetector CT imaging of the abdomen and pelvis was performed using the standard protocol during bolus administration of intravenous contrast. RADIATION DOSE REDUCTION: This exam was performed according to the departmental dose-optimization program which includes automated exposure control, adjustment of the mA and/or kV according to patient size and/or use of iterative reconstruction technique. CONTRAST:  126mL OMNIPAQUE IOHEXOL 350 MG/ML SOLN COMPARISON:  Chest CT angiogram dated 09/15/2021. CT abdomen and pelvis dated 09/15/2021. FINDINGS: CTA CHEST FINDINGS Cardiovascular: There is no pulmonary embolism identified within the main, lobar or central segmental pulmonary arteries bilaterally. Some  of the most peripheral pulmonary artery branches to each lung are difficult to definitively characterize due to mild patient breathing motion artifact. No pericardial  effusion. Aortic atherosclerosis. No evidence of aortic dissection. Mediastinum/Nodes: No mass or enlarged lymph nodes are identified within the mediastinum or perihilar regions. Esophagus is unremarkable. Trachea is unremarkable. Surgical changes at the RIGHT hilum, compatible with a previous surgical procedure/resection. Lungs/Pleura: Bilateral pulmonary nodules are not significantly changed in the short-term interval, compared to most recent chest CT of 09/15/2021. No new nodules seen. Stable small RIGHT pleural effusion, with overlying mild atelectasis. No pneumothorax. Musculoskeletal: Again noted is destructive change within the posterior eleventh rib. Again noted are multiple soft tissue masses within the soft tissues of the lateral and posterior RIGHT chest wall, not significantly changed in appearance compared to the earlier study of 09/15/2021. Review of the MIP images confirms the above findings. CT ABDOMEN and PELVIS FINDINGS Hepatobiliary: No acute or suspicious findings within the liver. Tiny hypodensities again noted within the liver, too small to definitively characterize but most likely small cysts. Gallbladder is unremarkable. No bile duct dilatation seen. Pancreas: Unremarkable. No pancreatic ductal dilatation or surrounding inflammatory changes. Spleen: Normal in size without focal abnormality. Adrenals/Urinary Tract: Adrenal glands appear normal. LEFT renal cyst. Kidneys otherwise unremarkable without suspicious mass, stone or hydronephrosis. Bladder is decompressed. Stomach/Bowel: No dilated large or small bowel loops. No evidence of bowel wall inflammation. Appendix is normal. Stomach is unremarkable, partially decompressed. Vascular/Lymphatic: Aortic atherosclerosis. No acute-appearing vascular abnormality. No enlarged lymph nodes are seen in the abdomen or pelvis. Reproductive: Prostate is unremarkable. Other: No free fluid or abscess collection is seen within the abdomen or pelvis. No free  intraperitoneal air. Musculoskeletal: No acute or suspicious osseous abnormality within the abdomen or pelvis. Review of the MIP images confirms the above findings. IMPRESSION: 1. No new findings within the chest, abdomen or pelvis. No pulmonary embolism. No evidence of pneumonia or pulmonary edema. 2. Stable small RIGHT pleural effusion, with overlying mild atelectasis. 3. Bilateral pulmonary nodules and soft tissue masses within the soft tissues of the RIGHT chest wall, as described above, not significantly changed in the short-term interval compared to the earlier study of 09/15/2021. 4. Stable destructive change within the posterior eleventh rib. 5. No acute findings within the abdomen or pelvis. No evidence of metastatic disease within the abdomen or pelvis. Aortic Atherosclerosis (ICD10-I70.0). Electronically Signed   By: Franki Cabot M.D.   On: 10/02/2021 21:27   CT Abdomen Pelvis W Contrast  Result Date: 09/15/2021 CLINICAL DATA:  Right-sided abdominal pain and flank pain. EXAM: CT ABDOMEN AND PELVIS WITH CONTRAST TECHNIQUE: Multidetector CT imaging of the abdomen and pelvis was performed using the standard protocol following bolus administration of intravenous contrast. RADIATION DOSE REDUCTION: This exam was performed according to the departmental dose-optimization program which includes automated exposure control, adjustment of the mA and/or kV according to patient size and/or use of iterative reconstruction technique. CONTRAST:  1104mL OMNIPAQUE IOHEXOL 350 MG/ML SOLN COMPARISON:  May 05, 2021 FINDINGS: Lower chest: Multiple subcentimeter noncalcified lung nodules are seen within the posterolateral aspect of the left lung base. Mild posterior right basilar scarring and/or atelectasis is noted. A small right pleural effusion is seen. Hepatobiliary: Punctate foci of parenchymal low attenuation are seen within the anterior aspect of the liver dome and inferior aspect of the right lobe of the liver  (axial CT images 10 and 32, CT series 5). These are too small to characterize by CT examination. No  gallstones, gallbladder wall thickening, or biliary dilatation. Pancreas: Unremarkable. No pancreatic ductal dilatation or surrounding inflammatory changes. Spleen: Normal in size without focal abnormality. Adrenals/Urinary Tract: Adrenal glands are unremarkable. Kidneys are normal in size, without renal calculi or hydronephrosis. A stable simple cyst is seen within the anterior aspect of the mid left kidney. Bladder is unremarkable. Stomach/Bowel: Stomach is within normal limits. Appendix appears normal. No evidence of bowel wall thickening, distention, or inflammatory changes. Noninflamed diverticula are seen within the sigmoid colon. Vascular/Lymphatic: Aortic atherosclerosis. No enlarged abdominal or pelvic lymph nodes. Reproductive: The prostate gland is mildly enlarged. Other: No abdominal wall hernia or abnormality. No abdominopelvic ascites. Musculoskeletal: Multiple soft tissue masses are seen within the musculature and subcutaneous fat of the lateral and posterior aspects of the lower right chest wall. Pleural invasion is noted within the posterior aspect of the right lung base. These soft tissue masses extend along the posterior wall of the mid and upper right abdomen. This represents a new finding. Lytic lesions are seen involving multiple right ribs with a pathologic fracture noted involving the posterior eleventh right rib. Degenerative changes are seen within the lumbar spine. IMPRESSION: 1. Multiple neoplastic soft tissue masses within the lateral and posterior aspects of the lower right chest wall with associated pleural invasion. 2. Lytic lesions involving multiple right ribs with a pathologic fracture of the posterior eleventh right rib. 3. Multiple subcentimeter noncalcified lung nodules within the left lung base, consistent with pulmonary metastatic disease. 4. Sigmoid diverticulosis. 5. Small  right pleural effusion. 6. Aortic atherosclerosis. Aortic Atherosclerosis (ICD10-I70.0). Electronically Signed   By: Virgina Norfolk M.D.   On: 09/15/2021 00:57   DG Chest Portable 1 View  Result Date: 10/02/2021 CLINICAL DATA:  Metastatic lung cancer being treated with immunotherapy. Generalized abdominal pain and back pain. EXAM: PORTABLE CHEST 1 VIEW COMPARISON:  09/16/2021 FINDINGS: Previous median sternotomy and CABG. Power port in place on the right with the tip at the SVC RA junction. Previous lobectomy on the right. The lungs are presently clear. No bone lesion visible on the frontal chest radiography. Known right eleventh rib lesion not visible using this technique. Aortic atherosclerotic calcification incidentally noted. IMPRESSION: Previous lobectomy on the right.  No active disease. Electronically Signed   By: Nelson Chimes M.D.   On: 10/02/2021 19:49   MR TOTAL SPINE METS SCREENING  Result Date: 10/10/2021 CLINICAL DATA:  Screening exam for spinal metastatic disease. EXAM: MRI TOTAL SPINE WITHOUT AND WITH CONTRAST TECHNIQUE: Multisequence MR imaging of the spine from the cervical spine to the sacrum was performed prior to and following IV contrast administration for evaluation of spinal metastatic disease. CONTRAST:  8 cc of Gadavist intravenous COMPARISON:  10/02/2021 CT of the chest and abdomen. Thoracic and lumbar spine MRI 02/18/2021. FINDINGS: MRI CERVICAL SPINE FINDINGS Alignment: Mild degenerative anterolisthesis at C3-4. Vertebrae: C7-T1 ACDF with superior endplate depression at C7. Probable solid arthrodesis at this level. Signal in the C7 vertebra on postcontrast imaging is likely from metallic susceptibility artifact and failure of fat suppression. No evidence of metastatic disease. Cord: Normal cord signal.  No abnormal intrathecal enhancement. Posterior Fossa, vertebral arteries, paraspinal tissues: Unremarkable Disc levels: Multilevel facet spurring. C7-T1 ACDF. Mild disc bulging  partially effacing ventral subarachnoid spaces at multiple levels. No degenerative cord compression. MRI THORACIC SPINE FINDINGS Alignment:  Unremarkable Vertebrae: Diffusely preserved fatty marrow. No evidence of bony metastasis, fracture, or inflammation. Known posterior right eleventh rib lesion with inter spinous tumor infiltration by CT. No continuation  into the adjacent foramen or spinal canal. Cord:  Normal signal and morphology. Paraspinal and other soft tissues: Known right paravertebral tumor as evaluated by CT. Disc levels: Generalized spondylosis. MRI LUMBAR SPINE FINDINGS Segmentation:  5 lumbar type vertebrae Alignment:  Physiologic Vertebrae: No evidence of metastasis. L1 hemangioma as previously demonstrated. Conus medullaris: Extends to the L1-2 level and appears normal. No abnormal intrathecal enhancement Paraspinal and other soft tissues: No visible mass or inflammation. Disc levels: Ordinary degenerative changes as previously demonstrated. Generalized disc space narrowing and bulging. IMPRESSION: Known posterior right eleventh rib lesion with soft tissue extension. No adjacent foraminal or spinal canal infiltration. Negative for metastatic disease throughout the spine. Electronically Signed   By: Jorje Guild M.D.   On: 10/10/2021 10:41        IMPRESSION/PLAN: 1. 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 We reviewed the findings from his MRI in the left hemisphere along the thalamus and discussion from brain oncology conference. The newly noted lesion was not treated previously, and Dr. Lisbeth Renshaw favors proceeding with single fraction SRS. We discussed the risks, benefits, short, and long term effects of radiotherapy, as well as the curative intent, and the patient is interested in proceeding. He will need a repeat 3T MRI for treatment planning and to confirm he is still a candidate for SRS. This is coordinated for this Friday, and simulation  will take place next week. Written consent is obtained and placed in the chart, a copy was provided to the patient. Our special procedures navigator will coordinate simulation and treatment with the patient as well.   2. Neurologic symptoms of headaches, numbness, and uncontrolled  jerking movements. He is being evaluated by Dr. [name] and we appreciate his input for his care.  In a visit lasting 45 minutes, greater than 50% of the time was spent face to face discussing the patient's condition, in preparation for the discussion, and coordinating the patient's care.    The above documentation reflects my direct findings during this shared patient visit. Please see the separate note by Dr. Lisbeth Renshaw on this date for the remainder of the patient's plan of care.     Carola Rhine, [facility]     **Disclaimer: This note was dictated with voice recognition software. Similar sounding words can inadvertently be transcribed and this note may contain transcription errors which may not have been corrected upon publication of note.**

## 2021-10-15 ENCOUNTER — Telehealth: Payer: Self-pay | Admitting: *Deleted

## 2021-10-15 ENCOUNTER — Other Ambulatory Visit: Payer: Self-pay | Admitting: Internal Medicine

## 2021-10-15 ENCOUNTER — Telehealth: Payer: Self-pay

## 2021-10-15 MED ORDER — GABAPENTIN 300 MG PO CAPS: 600.0000 mg | ORAL_CAPSULE | Freq: Three times a day (TID) | ORAL | 2 refills | Status: DC

## 2021-10-15 NOTE — Telephone Encounter (Signed)
Patient called concerning worsening numbness/tingling/pain originating in his elbow down his arm.   States that the pain had lessened and almost resolved when he spoke with Dr [name] on Monday, however starting that evening it started back and reports that last night it was so painful that he could not sleep as it woke him up almost hourly.    Reports already taking Gabapentin 300 TID, Dilaudid 8 mg q 3 hrs, Cymbalta 30 mg BID.    He is concerned about the pain worsening after almost being gone last week.  Routing to Dr [name] to please advise.

## 2021-10-15 NOTE — Telephone Encounter (Addendum)
Communicated response to patient.  Reports he will need a refill of the new dose to [facility].

## 2021-10-15 NOTE — Telephone Encounter (Signed)
[name] called our office to make sure we were aware that his Dilaudid would run out this weekend and to make sure he had a refill.  [name], NP notified and will send in refill so [name] doesn't run out over the weekend. All questions answered. Advised to call our office with any questions/concerns.

## 2021-10-16 ENCOUNTER — Ambulatory Visit
Admission: RE | Admit: 2021-10-16 | Discharge: 2021-10-16 | Disposition: A | Discharge status: Home / Self Care | Payer: Medicare Other | Source: Ambulatory Visit | Attending: Radiation Oncology | Admitting: Radiation Oncology

## 2021-10-16 ENCOUNTER — Other Ambulatory Visit: Payer: Self-pay

## 2021-10-16 ENCOUNTER — Other Ambulatory Visit (HOSPITAL_COMMUNITY): Payer: Self-pay

## 2021-10-16 ENCOUNTER — Other Ambulatory Visit: Payer: Self-pay | Admitting: Nurse Practitioner

## 2021-10-16 DIAGNOSIS — C7931 Secondary malignant neoplasm of brain: Secondary | ICD-10-CM

## 2021-10-16 MED ORDER — HYDROMORPHONE HCL 4 MG PO TABS
4.0000 mg | ORAL_TABLET | ORAL | 0 refills | Status: DC | PRN
  Filled 2021-10-16: qty 120, 8d supply, fill #0

## 2021-10-16 MED ORDER — HEPARIN SOD (PORK) LOCK FLUSH 100 UNIT/ML IV SOLN
500.0000 [IU] | Freq: Once | INTRAVENOUS | Status: AC
  Administered 2021-10-16: 500 [IU] via INTRAVENOUS

## 2021-10-16 MED ORDER — SODIUM CHLORIDE 0.9% FLUSH
10.0000 mL | INTRAVENOUS | Status: DC | PRN
  Administered 2021-10-16: 10 mL via INTRAVENOUS

## 2021-10-16 MED ORDER — HYDROMORPHONE HCL 4 MG PO TABS: 4.0000 mg | ORAL_TABLET | ORAL | 0 refills | Status: DC | PRN

## 2021-10-16 MED ORDER — GADOBENATE DIMEGLUMINE 529 MG/ML IV SOLN
15.0000 mL | Freq: Once | INTRAVENOUS | Status: AC | PRN
  Administered 2021-10-16: 15 mL via INTRAVENOUS

## 2021-10-18 NOTE — Progress Notes (Signed)
[facility] °OFFICE PROGRESS NOTE ° °Meyers, Stephen, MD °[address] °[place] [address] ° °DIAGNOSIS: Stage IV (pT1c, N1, M1b) large cell neuroendocrine carcinoma.  He presented with a right upper lobe lung nodule and hilar lymphadenopathy.  He was diagnosed in April 2022. He also had a small metastatic brain lesion.  The patient has local recurrence of his disease and the right chest wall diagnosed in November 2022. °Molecular studies by foundation 1 showed no actionable mutations °  °PDL1: 0% ° °PRIOR THERAPY: °1) Robotic assisted thoracic surgery with a right upper lobectomy which was performed on 12/24/2020.  °2) SRS to a small metastatic brain lesion completed on February 13, 2021 under the care of Dr. Moody.   °3) Adjuvant chemotherapy with cisplatin 80 mg/m2 on day 1, etoposide 100 mg per metered squared on days 1, 2, and 3, IV every 3 weeks. First dose on 02/09/21.  Status post 4 cycles. °4) SRS to 4 new brain metastasis under the care of Dr. Moody, Last dose on 08/26/21 °5) Palliative radiation to right chest wall from 09/01/21-09/23/21 under the care of Dr. Moody ° ° °CURRENT THERAPY:  °1) palliative systemic chemotherapy with carboplatin for an AUC of 5, Alimta 500 mg per metered square, Keytruda 200 mg IV every 3 weeks.  First dose on 09/29/21.  Carboplatin has been removed from cycle #2 due to pancytopenia.  °2) SRS to the new brain lesion along the thalamus under the care of Dr. Moody. Completed on 10/20/21 ° °INTERVAL HISTORY: °Colbin E Sturges 66 y.o. male returns to the clinic today for a follow-up visit.  Unfortunately, in the winter 2022, the patient was found to have recurrent disease with metastatic disease to the brain for which he underwent SRS in December 2022.  More recently, in February 2023, the patient was found to have a new lesion in the thalamus.  He is expected to undergo SRS to this tomorrow on 10/20/2021 under the care of Dr. Moody.  He has been endorsing headaches  and jerking movements of his extremities for which she sees Dr. Vaslow. ° °The patient has also had numerous hospitalizations related to uncontrolled pain.  The patient is currently followed by palliative care and he is currently taking morphine 60 mg TID daily, Dilaudid 8 mg every 3 hours, gabapentin 300 mg 3 times daily, and Cymbalta.  He also completed palliative radiation to the right chest wall lesion under the care of of Dr. Moody from radiation in January 2023.  He is expected to follow-up with palliative care later today.  He states his pain is more controlled at this time.  ° °For systemic treatment, the patient was started on carboplatin, Keytruda, and Alimta.  He had some pancytopenia following cycle #1.  He denies any abnormal bleeding or bruising in the interval but his aspirin and Xarelto had to be hold for 1 week.  He denies any abnormal signs and symptoms of infection including nasal congestion, diarrhea, abdominal pain, skin infections, or dysuria.   He denies any fever, chills, night sweats, unexplained weight loss.  Denies any hemoptysis.   The patient states his breathing is "fine".  He states that he sometimes gets winded with exertion but it is "not horrible".  He occasionally might have a mild cough and sore throat with coughing but not enough to warrant taking a cough medication.  He denies any nausea or vomiting except for he vomited this morning, he is unsure why.  Denies   any unusual diarrhea or constipation.  He is on bowel prophylaxis due to his pain medication use.  Denies any rashes or skin changes.  The patient is here today for evaluation and repeat blood work before considering starting cycle #2.  MEDICAL HISTORY: Past Medical History:  Diagnosis Date   Anemia    Anxiety    Arthritis    Basal cell carcinoma (BCC) of forehead    CAD (coronary artery disease)    a. 10/2015 ant STEMI >> LHC with 3 v CAD; oLAD tx with POBA >> emergent CABG. b. Multiple evals since that time,  early graft failure of SVG-RCA by cath 03/2016. c. 2/19 PCI/DES x1 to pRCA, normal EF.   Carotid artery disease ([place])    a. 40-59% BICA 02/2018.   Depression    Dyspnea    Ectopic atrial tachycardia (HCC)    Esophageal reflux    eosinophil esophagitis   Family history of adverse reaction to anesthesia    "sister has PONV" (06/21/2017)   Former tobacco use    Gout    Hepatitis C    "treated and cured" (06/21/2017)   High cholesterol    History of blood transfusion    History of kidney stones    Hypertension    Ischemic cardiomyopathy    a. EF 25-30% at intraop TEE 4/17  //  b. Limited Echo 5/17 - EF 45-50%, mild ant HK. c. EF 55-65% by cath 09/2017.   Migraine    "3-4/yr" (06/21/2017)   Myocardial infarction ([place]) 10/2015   Palpitations    Pneumonia    Sinus bradycardia    a. HR dropping into 40s in 02/2016 -> BB reduced.   Stroke [facility]) 10/2016   "small one; sometimes my memory/cognitive issues" (06/21/2017)   Symptomatic hypotension    a. 02/2016 ER visit -> meds reduced.   Syncope    Wears dentures    Wears glasses     ALLERGIES:  is allergic to prednisone, tetanus toxoids, wellbutrin [bupropion], and varenicline.  MEDICATIONS:  Current Outpatient Medications  Medication Sig Dispense Refill   acetaminophen (TYLENOL) 500 MG tablet Take 1,000 mg by mouth every 6 (six) hours as needed for mild pain, fever or headache.     aspirin EC 81 MG tablet Take 81 mg by mouth daily. Swallow whole.     docusate sodium (COLACE) 100 MG capsule Take 100-200 mg by mouth daily.     DULoxetine (CYMBALTA) 30 MG capsule Take 1 capsule (30 mg total) by mouth 2 (two) times daily. 60 capsule 1   folic acid (FOLVITE) 1 MG tablet Take 1 tablet (1 mg total) by mouth daily. 30 tablet 4   gabapentin (NEURONTIN) 300 MG capsule Take 2 capsules (600 mg total) by mouth 3 (three) times daily. 180 capsule 2   HYDROmorphone (DILAUDID) 4 MG tablet Take 1-2 tablets (4-8 mg total) by mouth every 3 (three) hours as  needed for severe pain. 120 tablet 0   lidocaine (LIDODERM) 5 % Place 1 patch onto the skin daily as needed (for pain- Remove & Discard patch within 12 hours or as directed by MD).     lidocaine-prilocaine (EMLA) cream Apply to the Port-A-Cath site 30-60-minute before treatment (Patient taking differently: Apply 1 application topically See admin instructions. Apply to the Port-A-Cath site 30-60-minutes before treatment) 30 g 0   magnesium oxide (MAG-OX) 400 (240 Mg) MG tablet Take 1 tablet (400 mg total) by mouth daily. 30 tablet 1   metoprolol succinate (TOPROL-XL)  25 MG 24 hr tablet Take 1 tablet (25 mg total) by mouth daily. (Patient taking differently: Take 25 mg by mouth every evening.) 90 tablet 3   midodrine (PROAMATINE) 2.5 MG tablet Take 2.5 mg by mouth 3 (three) times daily as needed (AS DIRECTED).     morphine (MS CONTIN) 60 MG 12 hr tablet Take 1 tablet (60 mg total) by mouth every 8 (eight) hours. **10/02/2021 90 tablet 0   Multiple Vitamin (MULTIVITAMIN WITH MINERALS) TABS tablet Take 1 tablet by mouth daily.     ondansetron (ZOFRAN) 8 MG tablet Take 1 tablet (8 mg total) by mouth every 8 (eight) hours as needed for nausea or vomiting. Starting 3 days after chemotherapy 90 tablet 2   pantoprazole (PROTONIX) 40 MG tablet Take 1 tablet by mouth daily at 6 AM. 30 tablet 1   polyethylene glycol (MIRALAX / GLYCOLAX) 17 g packet Take 17 g by mouth daily. (Patient not taking: Reported on 10/02/2021) 14 each 0   prochlorperazine (COMPAZINE) 10 MG tablet Take 1 tablet (10 mg total) by mouth every 6 (six) hours as needed for nausea or vomiting. 30 tablet 0   ranolazine (RANEXA) 1000 MG SR tablet Take 1 tablet (1,000 mg total) by mouth 2 (two) times daily. 180 tablet 3   senna-docusate (SENOKOT-S) 8.6-50 MG tablet Take 1 tablet by mouth 2 (two) times daily.     tiZANidine (ZANAFLEX) 2 MG tablet Take 1 tablet (2 mg total) by mouth at bedtime as needed for muscle spasms. (Patient taking differently:  Take 2 mg by mouth at bedtime.) 30 tablet 0   XARELTO 20 MG TABS tablet Take 20 mg by mouth every evening.     No current facility-administered medications for this visit.    SURGICAL HISTORY:  Past Surgical History:  Procedure Laterality Date   25 GAUGE PARS PLANA VITRECTOMY WITH 20 GAUGE MVR PORT  12/31/2020   Procedure: 25 GAUGE PARS PLANA VITRECTOMY WITH 20 GAUGE MVR PORT;  Surgeon: [name], MD;  [facility];  Service: Thoracic;;   ANTERIOR CERVICAL DECOMP/DISCECTOMY FUSION N/A 10/17/2018   Procedure: Anterior Cervical Decompression Fusion - Cervical seven -Thoracic one;  Surgeon: Consuella Lose, MD;  Location: [place];  Service: Neurosurgery;  Laterality: N/A;   BASAL CELL CARCINOMA EXCISION     "forehead   BIOPSY  07/20/2019   Procedure: BIOPSY;  Surgeon: Carol Ada, MD;  Location: [facility];  Service: Endoscopy;;   BIOPSY OF MEDIASTINAL MASS Right 07/29/2021   Procedure: RIGHT CHEST WALL MASS BIOPSY;  Surgeon: [name], MD;  Location: [place];  Service: Thoracic;  Laterality: Right;   BRONCHIAL BIOPSY  12/24/2020   Procedure: BRONCHIAL BIOPSIES;  Surgeon: Garner Nash, DO;  Location: [place] [facility];  Service: Pulmonary;;   BRONCHIAL BRUSHINGS  12/24/2020   Procedure: BRONCHIAL BRUSHINGS;  Surgeon: Garner Nash, DO;  Location: [place];  Service: Pulmonary;;   BRONCHIAL NEEDLE ASPIRATION BIOPSY  12/24/2020   Procedure: BRONCHIAL NEEDLE ASPIRATION BIOPSIES;  Surgeon: Garner Nash, DO;  Location: [place];  Service: Pulmonary;;   CARDIAC CATHETERIZATION N/A 11/28/2015   Procedure: Left Heart Cath and Coronary Angiography;  Surgeon: [name], MD;  Location: [place] [facility];  Service: Cardiovascular;  Laterality: N/A;   CARDIAC CATHETERIZATION N/A 11/28/2015   Procedure: Coronary Balloon Angioplasty;  Surgeon: [name], MD;  Location: [place] [facility];  Service: Cardiovascular;  Laterality: N/A;  ostial LAD    CARDIAC CATHETERIZATION N/A 11/28/2015  Procedure: Coronary/Graft Angiography;  Surgeon: Jayadeep S Varanasi, MD;  [facility];  Service: Cardiovascular;  Laterality: N/A;  coronaries only °  ° CARDIAC CATHETERIZATION N/A 04/21/2016  ° Procedure: Left Heart Cath and Coronary Angiography;  Surgeon: Muhammad A Arida, MD;  [facility];  Service: Cardiovascular;  Laterality: N/A;  ° CARDIAC CATHETERIZATION N/A 06/14/2016  ° Procedure: Left Heart Cath and Cors/Grafts Angiography;  Surgeon: Jonathan J Berry, MD;  [facility];  Service: Cardiovascular;  Laterality: N/A;  ° CARDIAC CATHETERIZATION N/A 09/08/2016  ° Procedure: Left Heart Cath and Cors/Grafts Angiography;  Surgeon: Muhammad A Arida, MD;  [facility];  Service: Cardiovascular;  Laterality: N/A;  ° CARDIAC CATHETERIZATION    ° CORONARY ARTERY BYPASS GRAFT N/A 11/28/2015  ° Procedure: CORONARY ARTERY BYPASS GRAFTING (CABG) TIMES FIVE USING LEFT INTERNAL MAMMARY ARTERY AND RIGHT GREATER SAPHENOUS,VIEN HARVEATED BY ENDOVIEN, INTRAOPPRATIVE TEE;  Surgeon: Bryan K Bartle, MD;  [facility];  Service: Open Heart Surgery;  Laterality: N/A;  ° CORONARY STENT INTERVENTION N/A 10/05/2017  ° Procedure: CORONARY STENT INTERVENTION;  Surgeon: Varanasi, Jayadeep S, MD;  [facility];  Service: Cardiovascular;  Laterality: N/A;  ° ESOPHAGOGASTRODUODENOSCOPY (EGD) WITH PROPOFOL N/A 07/20/2019  ° Procedure: ESOPHAGOGASTRODUODENOSCOPY (EGD) WITH PROPOFOL;  Surgeon: Hung, Patrick, MD;  [facility];  Service: Endoscopy;  Laterality: N/A;  ° ESOPHAGOGASTRODUODENOSCOPY (EGD) WITH PROPOFOL N/A 01/30/2021  ° Procedure: ESOPHAGOGASTRODUODENOSCOPY (EGD) WITH PROPOFOL;  Surgeon: Hung, Patrick, MD;  [facility];  Service: Endoscopy;  Laterality: N/A;  ° FIDUCIAL MARKER PLACEMENT  12/24/2020  ° Procedure: FIDUCIAL DYE MARKER PLACEMENT;  Surgeon: Icard, Bradley L, DO;  [facility];  Service: Pulmonary;;  ° HUMERUS SURGERY Right 1969  ° "tumor inside bone; filled it w/bone chips"  ° INTERCOSTAL NERVE BLOCK Right 12/24/2020  ° Procedure: INTERCOSTAL NERVE BLOCK;  Surgeon: Lightfoot, Harrell O, MD;  [facility];  Service: Thoracic;  Laterality: Right;  ° IR IMAGING GUIDED PORT INSERTION  02/06/2021  ° LEFT HEART CATH AND CORS/GRAFTS ANGIOGRAPHY N/A 03/11/2017  ° Procedure: Left Heart Cath and Cors/Grafts Angiography;  Surgeon: Harding, David W, MD;  [facility];  Service: Cardiovascular;  Laterality: N/A;  ° LEFT HEART CATH AND CORS/GRAFTS ANGIOGRAPHY N/A 10/05/2017  ° Procedure: LEFT HEART CATH AND CORS/GRAFTS ANGIOGRAPHY;  Surgeon: Varanasi, Jayadeep S, MD;  [facility];  Service: Cardiovascular;  Laterality: N/A;  ° LEFT HEART CATH AND CORS/GRAFTS ANGIOGRAPHY N/A 04/11/2019  ° Procedure: LEFT HEART CATH AND CORS/GRAFTS ANGIOGRAPHY;  Surgeon: Varanasi, Jayadeep S, MD;  [facility];  Service: Cardiovascular;  Laterality: N/A;  ° NODE DISSECTION Right 12/24/2020  ° Procedure: NODE DISSECTION;  Surgeon: Lightfoot, Harrell O, MD;  [facility];  Service: Thoracic;  Laterality: Right;  ° PERIPHERAL VASCULAR CATHETERIZATION N/A 06/14/2016  ° Procedure: Lower Extremity Angiography;  Surgeon: Jonathan J Berry, MD;  [facility];  Service: Cardiovascular;  Laterality: N/A;  ° VIDEO BRONCHOSCOPY WITH ENDOBRONCHIAL NAVIGATION Right 12/24/2020  ° Procedure: VIDEO BRONCHOSCOPY WITH ENDOBRONCHIAL NAVIGATION;  Surgeon: Icard, Bradley L, DO;  [facility];  Service: Pulmonary;  Laterality: Right;  ° VIDEO BRONCHOSCOPY WITH ENDOBRONCHIAL ULTRASOUND N/A 12/24/2020  ° Procedure: VIDEO BRONCHOSCOPY WITH ENDOBRONCHIAL ULTRASOUND;  Surgeon: Icard, Bradley L, DO;  [facility];  Service: Pulmonary;  Laterality: N/A;  ° VIDEO BRONCHOSCOPY WITH INSERTION OF INTERBRONCHIAL VALVE (IBV) N/A 12/31/2020  °   Procedure: VIDEO  BRONCHOSCOPY WITH INSERTION OF INTERBRONCHIAL VALVE (IBV).VALVE IN CARTRIDGE 55m,9mm. CHEST TUBE PLACEMENT.;  Surgeon: LLajuana Matte MD;  [facility];  Service: Thoracic;  Laterality: N/A;   VIDEO BRONCHOSCOPY WITH INSERTION OF INTERBRONCHIAL VALVE (IBV) N/A 07/29/2021   Procedure: VIDEO BRONCHOSCOPY WITH REMOVAL OF INTERBRONCHIAL VALVE (IBV);  Surgeon: LLajuana Matte MD;  [facility];  Service: Thoracic;  Laterality: N/A;    REVIEW OF SYSTEMS:   Review of Systems  Constitutional: Negative for appetite change, chills, fatigue, fever and unexpected weight change.  HENT: Negative for mouth sores, nosebleeds, sore throat and trouble swallowing.   Eyes: Negative for eye problems and icterus.  Respiratory: Positive for occasional mild dyspnea on exertion and cough.  Negative for hemoptysis and wheezing.   Cardiovascular: Negative for chest pain and leg swelling.  Gastrointestinal: Positive for vomiting once this morning.  Negative for abdominal pain, constipation, diarrhea, and nausea. Genitourinary: Negative for bladder incontinence, difficulty urinating, dysuria, frequency and hematuria.   Musculoskeletal: Negative for back pain, gait problem, neck pain and neck stiffness.  Skin: Negative for itching and rash.  Neurological: Negative for dizziness, extremity weakness, gait problem, headaches, light-headedness and seizures.  Hematological: Negative for adenopathy. Does not bruise/bleed easily.  Psychiatric/Behavioral: Negative for confusion, depression and sleep disturbance. The patient is not nervous/anxious.     PHYSICAL EXAMINATION:  Blood pressure 127/68, pulse 69, temperature (!) 97.2 F (36.2 C), temperature source Tympanic, resp. rate 18, weight 173 lb 2 oz (78.5 kg), SpO2 98 %.  ECOG PERFORMANCE STATUS: 1  Physical Exam  Constitutional: Oriented to person, place, and time and well-developed, well-nourished, and in no distress.  HENT:  Head: Normocephalic and  atraumatic.  Mouth/Throat: Oropharynx is clear and moist. No oropharyngeal exudate.  Eyes: Conjunctivae are normal. Right eye exhibits no discharge. Left eye exhibits no discharge. No scleral icterus.  Neck: Normal range of motion. Neck supple.  Cardiovascular: Normal rate, regular rhythm, normal heart sounds and intact distal pulses.   Pulmonary/Chest: Effort normal and breath sounds normal. No respiratory distress. No wheezes. No rales.  Abdominal: Soft. Bowel sounds are normal. Exhibits no distension and no mass. There is no tenderness.  Musculoskeletal: Normal range of motion. Exhibits no edema.  Lymphadenopathy:    No cervical adenopathy.  Neurological: Alert and oriented to person, place, and time. Exhibits normal muscle tone. Gait normal. Coordination normal.  Skin: Skin is warm and dry. No rash noted. Not diaphoretic. No erythema. No pallor.  Psychiatric: Mood, memory and judgment normal.  Vitals reviewed.  LABORATORY DATA: Lab Results  Component Value Date   WBC 4.3 10/19/2021   HGB 8.9 (L) 10/19/2021   HCT 27.8 (L) 10/19/2021   MCV 94.9 10/19/2021   PLT 101 (L) 10/19/2021      Chemistry      Component Value Date/Time   NA 137 10/19/2021 1054   K 4.8 10/19/2021 1054   CL 103 10/19/2021 1054   CO2 30 10/19/2021 1054   BUN 16 10/19/2021 1054   CREATININE 0.99 10/19/2021 1054   CREATININE 1.00 02/12/2016 1038      Component Value Date/Time   CALCIUM 9.3 10/19/2021 1054   ALKPHOS 118 10/19/2021 1054   AST 25 10/19/2021 1054   ALT 23 10/19/2021 1054   BILITOT 0.4 10/19/2021 1054       RADIOGRAPHIC STUDIES:  CT Angio Chest PE W and/or Wo Contrast  Result Date: 10/02/2021 CLINICAL DATA:  Lung cancer patient with bone metastasis. Generalized abdominal pain  and low back pain status post commencement of immunotherapy. EXAM: CT ANGIOGRAPHY CHEST CT ABDOMEN AND PELVIS WITH CONTRAST TECHNIQUE: Multidetector CT imaging of the chest was performed using the standard protocol  during bolus administration of intravenous contrast. Multiplanar CT image reconstructions and MIPs were obtained to evaluate the vascular anatomy. Multidetector CT imaging of the abdomen and pelvis was performed using the standard protocol during bolus administration of intravenous contrast. RADIATION DOSE REDUCTION: This exam was performed according to the departmental dose-optimization program which includes automated exposure control, adjustment of the mA and/or kV according to patient size and/or use of iterative reconstruction technique. CONTRAST:  100mL OMNIPAQUE IOHEXOL 350 MG/ML SOLN COMPARISON:  Chest CT angiogram dated 09/15/2021. CT abdomen and pelvis dated 09/15/2021. FINDINGS: CTA CHEST FINDINGS Cardiovascular: There is no pulmonary embolism identified within the main, lobar or central segmental pulmonary arteries bilaterally. Some of the most peripheral pulmonary artery branches to each lung are difficult to definitively characterize due to mild patient breathing motion artifact. No pericardial effusion. Aortic atherosclerosis. No evidence of aortic dissection. Mediastinum/Nodes: No mass or enlarged lymph nodes are identified within the mediastinum or perihilar regions. Esophagus is unremarkable. Trachea is unremarkable. Surgical changes at the RIGHT hilum, compatible with a previous surgical procedure/resection. Lungs/Pleura: Bilateral pulmonary nodules are not significantly changed in the short-term interval, compared to most recent chest CT of 09/15/2021. No new nodules seen. Stable small RIGHT pleural effusion, with overlying mild atelectasis. No pneumothorax. Musculoskeletal: Again noted is destructive change within the posterior eleventh rib. Again noted are multiple soft tissue masses within the soft tissues of the lateral and posterior RIGHT chest wall, not significantly changed in appearance compared to the earlier study of 09/15/2021. Review of the MIP images confirms the above findings. CT  ABDOMEN and PELVIS FINDINGS Hepatobiliary: No acute or suspicious findings within the liver. Tiny hypodensities again noted within the liver, too small to definitively characterize but most likely small cysts. Gallbladder is unremarkable. No bile duct dilatation seen. Pancreas: Unremarkable. No pancreatic ductal dilatation or surrounding inflammatory changes. Spleen: Normal in size without focal abnormality. Adrenals/Urinary Tract: Adrenal glands appear normal. LEFT renal cyst. Kidneys otherwise unremarkable without suspicious mass, stone or hydronephrosis. Bladder is decompressed. Stomach/Bowel: No dilated large or small bowel loops. No evidence of bowel wall inflammation. Appendix is normal. Stomach is unremarkable, partially decompressed. Vascular/Lymphatic: Aortic atherosclerosis. No acute-appearing vascular abnormality. No enlarged lymph nodes are seen in the abdomen or pelvis. Reproductive: Prostate is unremarkable. Other: No free fluid or abscess collection is seen within the abdomen or pelvis. No free intraperitoneal air. Musculoskeletal: No acute or suspicious osseous abnormality within the abdomen or pelvis. Review of the MIP images confirms the above findings. IMPRESSION: 1. No new findings within the chest, abdomen or pelvis. No pulmonary embolism. No evidence of pneumonia or pulmonary edema. 2. Stable small RIGHT pleural effusion, with overlying mild atelectasis. 3. Bilateral pulmonary nodules and soft tissue masses within the soft tissues of the RIGHT chest wall, as described above, not significantly changed in the short-term interval compared to the earlier study of 09/15/2021. 4. Stable destructive change within the posterior eleventh rib. 5. No acute findings within the abdomen or pelvis. No evidence of metastatic disease within the abdomen or pelvis. Aortic Atherosclerosis (ICD10-I70.0). Electronically Signed   By: Stan  Maynard M.D.   On: 10/02/2021 21:27  ° °MR Brain W Wo Contrast ° °Result Date:  10/16/2021 °CLINICAL DATA:  Brain metastases.  Assess treatment response. EXAM: MRI HEAD WITHOUT AND WITH CONTRAST TECHNIQUE:   Multiplanar, multiecho pulse sequences of the brain and surrounding structures were obtained without and with intravenous contrast. CONTRAST:  28m MULTIHANCE GADOBENATE DIMEGLUMINE 529 MG/ML IV SOLN COMPARISON:  10/08/2021 FINDINGS: Brain: Diffusion imaging does not show any acute or subacute infarction or any cause restricted diffusion. No hydrocephalus or extra-axial collection. No acute intracranial hemorrhage. Slight enlargement of a ring-enhancing lesion in the left cerebellum, measuring 5 mm today compared with 3.5 mm 8 days ago. Small focus of hemosiderin deposition in the right cerebellum is present without abnormal enhancement. No second enhancing cerebellar lesion. Stable punctate focus of enhancement at the deep insula on the right axial image 75. Slight enlargement of an enhancing lesion of the left basal ganglia/genu of the internal capsule, measuring 7.6 mm today compared with 5.7 mm previously. Stable punctate focus of enhancement along a left posterior parietal gyrus, axial image 122. Stable 3 mm focus of enhancement along a right posterior frontal gyrus axial image 137. Stable 2.5 mm focus of enhancement along a left posterior frontal gyrus, axial image 139. No entirely new lesion is identified. Vascular: Major vessels at the base of the brain show flow. Skull and upper cervical spine: Negative Sinuses/Orbits: Clear/normal Other: None IMPRESSION: Slight enlargement of the ring-enhancing lesion in the left cerebellum, measuring 5 mm today compared with 3.5 mm 8 days ago. Slight enlargement of an enhancing lesion of the left basal ganglia/genu of the internal capsule, measuring 7.6 mm today compared with 5.7 mm previously. Four other stable punctate lesions, right deep insula axial image 75, left posterior parietal gyrus axial image 122, right posterior frontal gyrus axial  image 137, left posterior frontal gyrus axial image 139. No entirely new lesion. Electronically Signed   By: MNelson ChimesM.D.   On: 10/16/2021 15:41   MR BRAIN W WO CONTRAST  Addendum Date: 10/13/2021   ADDENDUM REPORT: 10/13/2021 13:17 ADDENDUM: The current MRI brain was discussed at Brain tumor Conference. Further detail was requested regarding the lesion in the left globus pallidus. This lesion enhances and currently measures 5 mm in diameter. This lesion shows interval growth since the MRI of 08/14/2021 when the lesion measured approximately 2 mm in diameter. Lesion is not seen in September of 2022 MRI. Chronic hemorrhage is seen in the lesion on the current study and in December but not present on May 15, 2021 MRI. Electronically Signed   By: CFranchot GalloM.D.   On: 10/13/2021 13:17   Result Date: 10/13/2021 CLINICAL DATA:  Sudden onset severe headache. History of lung cancer with metastatic to brain. Post radiation and chemo. EXAM: MRI HEAD WITHOUT AND WITH CONTRAST TECHNIQUE: Multiplanar, multiecho pulse sequences of the brain and surrounding structures were obtained without and with intravenous contrast. CONTRAST:  753mGADAVIST GADOBUTROL 1 MMOL/ML IV SOLN COMPARISON:  MRI head with contrast 08/14/2021 FINDINGS: Brain: Multiple small metastatic deposits in the brain. Mixed response to treatment with several lesions larger and most lesions smaller. Larger lesions: Left cerebellar 4 mm lesion axial image 33 appears larger with new area of surrounding edema. Associated chronic hemorrhage has progressed. This is a treated lesion and enlargement could be related to tumor growth or treatment effect. Enhancing lesion in the left globus pallidus region measures 5 mm and is increased in size. Associated chronic hemorrhage is unchanged. No edema. Stable or smaller lesions: 3 mm lesion right posterior insula axial image 86 slightly smaller 2 mm lesion left parietal cortex axial image 128 smaller 3 mm  lesion left frontal cortex axial image 129  is smaller 3 mm lesion right frontal cortex over the convexity is slightly smaller. Axial image 143 Ventricle size normal.  Negative for acute infarct. Vascular: Normal arterial flow voids Skull and upper cervical spine: No suspicious skeletal lesion. Sinuses/Orbits: Paranasal sinuses clear.  Negative orbit Other: None IMPRESSION: Multiple small metastatic deposits to the brain. Mixed response with 2 lesions mildly larger and the remaining lesions stable or smaller. Electronically Signed: By: Charles  Clark M.D. On: 10/08/2021 12:27  ° °CT ABDOMEN PELVIS W CONTRAST ° °Result Date: 10/02/2021 °CLINICAL DATA:  Lung cancer patient with bone metastasis. Generalized abdominal pain and low back pain status post commencement of immunotherapy. EXAM: CT ANGIOGRAPHY CHEST CT ABDOMEN AND PELVIS WITH CONTRAST TECHNIQUE: Multidetector CT imaging of the chest was performed using the standard protocol during bolus administration of intravenous contrast. Multiplanar CT image reconstructions and MIPs were obtained to evaluate the vascular anatomy. Multidetector CT imaging of the abdomen and pelvis was performed using the standard protocol during bolus administration of intravenous contrast. RADIATION DOSE REDUCTION: This exam was performed according to the departmental dose-optimization program which includes automated exposure control, adjustment of the mA and/or kV according to patient size and/or use of iterative reconstruction technique. CONTRAST:  100mL OMNIPAQUE IOHEXOL 350 MG/ML SOLN COMPARISON:  Chest CT angiogram dated 09/15/2021. CT abdomen and pelvis dated 09/15/2021. FINDINGS: CTA CHEST FINDINGS Cardiovascular: There is no pulmonary embolism identified within the main, lobar or central segmental pulmonary arteries bilaterally. Some of the most peripheral pulmonary artery branches to each lung are difficult to definitively characterize due to mild patient breathing motion artifact.  No pericardial effusion. Aortic atherosclerosis. No evidence of aortic dissection. Mediastinum/Nodes: No mass or enlarged lymph nodes are identified within the mediastinum or perihilar regions. Esophagus is unremarkable. Trachea is unremarkable. Surgical changes at the RIGHT hilum, compatible with a previous surgical procedure/resection. Lungs/Pleura: Bilateral pulmonary nodules are not significantly changed in the short-term interval, compared to most recent chest CT of 09/15/2021. No new nodules seen. Stable small RIGHT pleural effusion, with overlying mild atelectasis. No pneumothorax. Musculoskeletal: Again noted is destructive change within the posterior eleventh rib. Again noted are multiple soft tissue masses within the soft tissues of the lateral and posterior RIGHT chest wall, not significantly changed in appearance compared to the earlier study of 09/15/2021. Review of the MIP images confirms the above findings. CT ABDOMEN and PELVIS FINDINGS Hepatobiliary: No acute or suspicious findings within the liver. Tiny hypodensities again noted within the liver, too small to definitively characterize but most likely small cysts. Gallbladder is unremarkable. No bile duct dilatation seen. Pancreas: Unremarkable. No pancreatic ductal dilatation or surrounding inflammatory changes. Spleen: Normal in size without focal abnormality. Adrenals/Urinary Tract: Adrenal glands appear normal. LEFT renal cyst. Kidneys otherwise unremarkable without suspicious mass, stone or hydronephrosis. Bladder is decompressed. Stomach/Bowel: No dilated large or small bowel loops. No evidence of bowel wall inflammation. Appendix is normal. Stomach is unremarkable, partially decompressed. Vascular/Lymphatic: Aortic atherosclerosis. No acute-appearing vascular abnormality. No enlarged lymph nodes are seen in the abdomen or pelvis. Reproductive: Prostate is unremarkable. Other: No free fluid or abscess collection is seen within the abdomen or  pelvis. No free intraperitoneal air. Musculoskeletal: No acute or suspicious osseous abnormality within the abdomen or pelvis. Review of the MIP images confirms the above findings. IMPRESSION: 1. No new findings within the chest, abdomen or pelvis. No pulmonary embolism. No evidence of pneumonia or pulmonary edema. 2. Stable small RIGHT pleural effusion, with overlying mild atelectasis. 3. Bilateral pulmonary nodules and   soft tissue masses within the soft tissues of the RIGHT chest wall, as described above, not significantly changed in the short-term interval compared to the earlier study of 09/15/2021. 4. Stable destructive change within the posterior eleventh rib. 5. No acute findings within the abdomen or pelvis. No evidence of metastatic disease within the abdomen or pelvis. Aortic Atherosclerosis (ICD10-I70.0). Electronically Signed   By: Stan  Maynard M.D.   On: 10/02/2021 21:27  ° °DG Chest Portable 1 View ° °Result Date: 10/02/2021 °CLINICAL DATA:  Metastatic lung cancer being treated with immunotherapy. Generalized abdominal pain and back pain. EXAM: PORTABLE CHEST 1 VIEW COMPARISON:  09/16/2021 FINDINGS: Previous median sternotomy and CABG. Power port in place on the right with the tip at the SVC RA junction. Previous lobectomy on the right. The lungs are presently clear. No bone lesion visible on the frontal chest radiography. Known right eleventh rib lesion not visible using this technique. Aortic atherosclerotic calcification incidentally noted. IMPRESSION: Previous lobectomy on the right.  No active disease. Electronically Signed   By: Mark  Shogry M.D.   On: 10/02/2021 19:49  ° °MR TOTAL SPINE METS SCREENING ° °Result Date: 10/10/2021 °CLINICAL DATA:  Screening exam for spinal metastatic disease. EXAM: MRI TOTAL SPINE WITHOUT AND WITH CONTRAST TECHNIQUE: Multisequence MR imaging of the spine from the cervical spine to the sacrum was performed prior to and following IV contrast administration for  evaluation of spinal metastatic disease. CONTRAST:  8 cc of Gadavist intravenous COMPARISON:  10/02/2021 CT of the chest and abdomen. Thoracic and lumbar spine MRI 02/18/2021. FINDINGS: MRI CERVICAL SPINE FINDINGS Alignment: Mild degenerative anterolisthesis at C3-4. Vertebrae: C7-T1 ACDF with superior endplate depression at C7. Probable solid arthrodesis at this level. Signal in the C7 vertebra on postcontrast imaging is likely from metallic susceptibility artifact and failure of fat suppression. No evidence of metastatic disease. Cord: Normal cord signal.  No abnormal intrathecal enhancement. Posterior Fossa, vertebral arteries, paraspinal tissues: Unremarkable Disc levels: Multilevel facet spurring. C7-T1 ACDF. Mild disc bulging partially effacing ventral subarachnoid spaces at multiple levels. No degenerative cord compression. MRI THORACIC SPINE FINDINGS Alignment:  Unremarkable Vertebrae: Diffusely preserved fatty marrow. No evidence of bony metastasis, fracture, or inflammation. Known posterior right eleventh rib lesion with inter spinous tumor infiltration by CT. No continuation into the adjacent foramen or spinal canal. Cord:  Normal signal and morphology. Paraspinal and other soft tissues: Known right paravertebral tumor as evaluated by CT. Disc levels: Generalized spondylosis. MRI LUMBAR SPINE FINDINGS Segmentation:  5 lumbar type vertebrae Alignment:  Physiologic Vertebrae: No evidence of metastasis. L1 hemangioma as previously demonstrated. Conus medullaris: Extends to the L1-2 level and appears normal. No abnormal intrathecal enhancement Paraspinal and other soft tissues: No visible mass or inflammation. Disc levels: Ordinary degenerative changes as previously demonstrated. Generalized disc space narrowing and bulging. IMPRESSION: Known posterior right eleventh rib lesion with soft tissue extension. No adjacent foraminal or spinal canal infiltration. Negative for metastatic disease throughout the spine.  Electronically Signed   By: Jonathan  Watts M.D.   On: 10/10/2021 10:41   ° ° °ASSESSMENT/PLAN:  °This is a very pleasant 65-year-old Caucasian male diagnosed with stage IV (T1c, N1, M1b) large cell neuroendocrine carcinoma.  He initially presented with a right upper lobe lung nodule in addition to hilar adenopathy in April 2022.  He also was found to have a solitary brain metastasis in May 2022. ° °The patient underwent right upper lobectomy with lymph node sampling under the care of Dr. Lightfoot in April 2022 ° °  Then completed SRS to the small metastatic brain lesion in June 2022 under the care of Dr. Moody. ° °He then underwent adjuvant chemotherapy with cisplatin 80 mg/m2 on day 1, etoposide 100 mg per metered squared on days 1, 2, and 3 IV every 3 weeks  He status post 4 cycles.  He had a challenging time with treatment with numerous hospitalizations.  This was discontinued after cycle #4. ° °He was on observation for several months but continued to have right-sided chest discomfort.  He was found to have recurrent disease in the right chest wall status post resection by Dr. Lightfoot and the final pathology was consistent with local recurrence of his disease in the right chest wall. ° °In December 2022, the patient was found to have 4 small brain metastases.  That was treated with SRS under the care of Dr. Moody. ° °He then underwent palliative radiation to the local recurrence in the chest in January 2023. ° °He was found to have a new lesion in the thalamus and is expected to undergo SRS tomorrow on 10/20/2021. ° °Repeat imaging studies showed new bilateral pulmonary nodules.  Therefore, he started on systemic chemotherapy with carboplatin for an AUC of 5, Alimta 500 mg per metered square, Keytruda 20 mg IV every 3 weeks.  He is status post 1 cycle.  He had some pancytopenia following cycle #1.  Denies any infection or abnormal bleeding or bruising. ° °The patient was seen with Dr. Mohamed today.  Labs  reviewed.  His blood counts did recover and are acceptable to proceed with cycle 2 today.  However, Dr. Mohamed will remove carboplatin from his care plan due to the myelosuppression.  Discussed with the patient that since his platelet count is greater than 50k, he can also resume his aspirin and Xarelto.  We will continue to monitor his labs closely every week.  He will continue with Alimta 500 mg per metered squared and Keytruda 20 mg IV every 3 weeks starting today.  ° °We will see him back for follow-up visit in 3 weeks for evaluation before undergoing cycle #3. ° °He will continue to follow closely with radiation oncology neurooncology, and palliative care.  He is scheduled to meet with palliative care later today. ° °The patient was advised to call immediately if he has any concerning symptoms in the interval. °The patient voices understanding of current disease status and treatment options and is in agreement with the current care plan. °All questions were answered. The patient knows to call the clinic with any problems, questions or concerns. We can certainly see the patient much sooner if necessary ° ° ° ° ° ° ° ° °Orders Placed This Encounter  °Procedures  ° Sample to Blood Bank  °  Standing Status:   Standing  °  Number of Occurrences:   5  °  Standing Expiration Date:   10/19/2022  °  ° ° °Kynslei Art L Deah Ottaway, PA-C °10/19/21 ° °ADDENDUM: °Hematology/Oncology Attending: °I had a face-to-face encounter with the patient today.  I reviewed his record, lab and recommended his care plan.  This is a 65 years old white male with metastatic non-small cell lung cancer, large cell neuroendocrine carcinoma that was initially thought to be early stage disease status post right upper lobectomy with lymph node dissection in April 2022.  The patient started adjuvant systemic chemotherapy at that time initially with cisplatin and etoposide but has a rough time with the treatment and this was a change in the   last  with cycle 2 carboplatin and etoposide.  During his treatment he was found to have solitary brain metastasis that was treated. The patient was on observation for few months but unfortunately developed disease recurrence in the right chest wall area close to the surgical scar in addition to few other pulmonary nodules and several metastatic brain lesions. He underwent palliative radiotherapy to the local disease recurrence in the chest wall as well as bone metastasis as well as brain radiation. He is currently undergoing systemic chemotherapy with carboplatin for AUC of 5, Alimta 500 Mg/M2 and Keytruda 200 Mg IV every 3 weeks status post 1 cycle.  He tolerated the first cycle of his treatment well except for pancytopenia and he was admitted to the hospital for management of his condition in addition to management of his intolerable pain at that time.  He is feeling much better and his pain medication is adequate for his management and he is followed by the palliative care team. I had a lengthy discussion with the patient today about his condition and treatment options. I recommended for the patient to proceed with cycle #2 today but I will discontinue carboplatin starting from this cycle because of his pancytopenia as well as previous treatment with platinum based chemotherapy.  He will be on Alimta 500 Mg/M2 as well as Keytruda 200 Mg IV every 3 weeks.  We will continue to monitor his blood work closely on weekly basis to monitor for any cytopenias with the treatment.  If he has no further cytopenia with the maintenance therapy, we will change his lab work to be done every 3 weeks. The patient will come back for follow-up visit in 3 weeks for evaluation before starting cycle #3. He will have repeat imaging studies after cycle #3 of his treatment. The patient was advised to call immediately if he has any other concerning symptoms in the interval. The total time spent in the appointment was 30  minutes.  Disclaimer: This note was dictated with voice recognition software. Similar sounding words can inadvertently be transcribed and may be missed upon review. Eilleen Kempf, MD

## 2021-10-19 ENCOUNTER — Inpatient Hospital Stay: Payer: Medicare Other

## 2021-10-19 ENCOUNTER — Inpatient Hospital Stay (HOSPITAL_BASED_OUTPATIENT_CLINIC_OR_DEPARTMENT_OTHER): Payer: Medicare Other | Admitting: Nurse Practitioner

## 2021-10-19 ENCOUNTER — Inpatient Hospital Stay (HOSPITAL_BASED_OUTPATIENT_CLINIC_OR_DEPARTMENT_OTHER): Payer: Medicare Other | Admitting: Physician Assistant

## 2021-10-19 ENCOUNTER — Other Ambulatory Visit: Payer: Self-pay

## 2021-10-19 ENCOUNTER — Ambulatory Visit: Payer: Medicare Other | Admitting: Radiation Oncology

## 2021-10-19 ENCOUNTER — Encounter: Payer: Self-pay | Admitting: Nurse Practitioner

## 2021-10-19 ENCOUNTER — Ambulatory Visit: Payer: Medicare Other

## 2021-10-19 VITALS — BP 127/68 | HR 69 | Temp 97.2°F | Resp 18 | Wt 173.1 lb

## 2021-10-19 DIAGNOSIS — T451X5A Adverse effect of antineoplastic and immunosuppressive drugs, initial encounter: Secondary | ICD-10-CM

## 2021-10-19 DIAGNOSIS — Z5112 Encounter for antineoplastic immunotherapy: Secondary | ICD-10-CM | POA: Diagnosis not present

## 2021-10-19 DIAGNOSIS — D6481 Anemia due to antineoplastic chemotherapy: Secondary | ICD-10-CM | POA: Diagnosis not present

## 2021-10-19 DIAGNOSIS — C3491 Malignant neoplasm of unspecified part of right bronchus or lung: Secondary | ICD-10-CM | POA: Diagnosis not present

## 2021-10-19 DIAGNOSIS — Z5111 Encounter for antineoplastic chemotherapy: Secondary | ICD-10-CM

## 2021-10-19 DIAGNOSIS — G893 Neoplasm related pain (acute) (chronic): Secondary | ICD-10-CM | POA: Diagnosis not present

## 2021-10-19 DIAGNOSIS — K5903 Drug induced constipation: Secondary | ICD-10-CM | POA: Diagnosis not present

## 2021-10-19 DIAGNOSIS — Z515 Encounter for palliative care: Secondary | ICD-10-CM

## 2021-10-19 DIAGNOSIS — C7A1 Malignant poorly differentiated neuroendocrine tumors: Secondary | ICD-10-CM | POA: Diagnosis not present

## 2021-10-19 DIAGNOSIS — Z95828 Presence of other vascular implants and grafts: Secondary | ICD-10-CM

## 2021-10-19 DIAGNOSIS — R5382 Chronic fatigue, unspecified: Secondary | ICD-10-CM

## 2021-10-19 LAB — CBC WITH DIFFERENTIAL (CANCER CENTER ONLY)
Abs Immature Granulocytes: 0.03 10*3/uL (ref 0.00–0.07)
Basophils Absolute: 0 10*3/uL (ref 0.0–0.1)
Basophils Relative: 0 %
Eosinophils Absolute: 0 10*3/uL (ref 0.0–0.5)
Eosinophils Relative: 1 %
HCT: 27.8 % — ABNORMAL LOW (ref 39.0–52.0)
Hemoglobin: 8.9 g/dL — ABNORMAL LOW (ref 13.0–17.0)
Immature Granulocytes: 1 %
Lymphocytes Relative: 14 %
Lymphs Abs: 0.6 10*3/uL — ABNORMAL LOW (ref 0.7–4.0)
MCH: 30.4 pg (ref 26.0–34.0)
MCHC: 32 g/dL (ref 30.0–36.0)
MCV: 94.9 fL (ref 80.0–100.0)
Monocytes Absolute: 0.5 10*3/uL (ref 0.1–1.0)
Monocytes Relative: 12 %
Neutro Abs: 3.1 10*3/uL (ref 1.7–7.7)
Neutrophils Relative %: 72 %
Platelet Count: 101 10*3/uL — ABNORMAL LOW (ref 150–400)
RBC: 2.93 MIL/uL — ABNORMAL LOW (ref 4.22–5.81)
RDW: 17.4 % — ABNORMAL HIGH (ref 11.5–15.5)
WBC Count: 4.3 10*3/uL (ref 4.0–10.5)
nRBC: 0 % (ref 0.0–0.2)

## 2021-10-19 LAB — CMP (CANCER CENTER ONLY)
ALT: 23 U/L (ref 0–44)
AST: 25 U/L (ref 15–41)
Albumin: 3.7 g/dL (ref 3.5–5.0)
Alkaline Phosphatase: 118 U/L (ref 38–126)
Anion gap: 4 — ABNORMAL LOW (ref 5–15)
BUN: 16 mg/dL (ref 8–23)
CO2: 30 mmol/L (ref 22–32)
Calcium: 9.3 mg/dL (ref 8.9–10.3)
Chloride: 103 mmol/L (ref 98–111)
Creatinine: 0.99 mg/dL (ref 0.61–1.24)
GFR, Estimated: >60 mL/min (ref >60)
Glucose, Bld: 109 mg/dL — ABNORMAL HIGH (ref 70–99)
Potassium: 4.8 mmol/L (ref 3.5–5.1)
Sodium: 137 mmol/L (ref 135–145)
Total Bilirubin: 0.4 mg/dL (ref 0.3–1.2)
Total Protein: 7 g/dL (ref 6.5–8.1)

## 2021-10-19 LAB — TSH: TSH: 1.135 u[IU]/mL (ref 0.320–4.118)

## 2021-10-19 MED ORDER — SODIUM CHLORIDE 0.9 % IV SOLN
200.0000 mg | Freq: Once | INTRAVENOUS | Status: AC
  Administered 2021-10-19: 200 mg via INTRAVENOUS
  Filled 2021-10-19: qty 200

## 2021-10-19 MED ORDER — SODIUM CHLORIDE 0.9 % IV SOLN: Freq: Once | INTRAVENOUS | Status: AC

## 2021-10-19 MED ORDER — SODIUM CHLORIDE 0.9% FLUSH
10.0000 mL | INTRAVENOUS | Status: DC | PRN
  Administered 2021-10-19: 10 mL

## 2021-10-19 MED ORDER — PALONOSETRON HCL INJECTION 0.25 MG/5ML: 0.2500 mg | Freq: Once | INTRAVENOUS | Status: DC

## 2021-10-19 MED ORDER — SODIUM CHLORIDE 0.9 % IV SOLN
500.0000 mg/m2 | Freq: Once | INTRAVENOUS | Status: AC
  Administered 2021-10-19: 1000 mg via INTRAVENOUS
  Filled 2021-10-19: qty 40

## 2021-10-19 MED ORDER — HEPARIN SOD (PORK) LOCK FLUSH 100 UNIT/ML IV SOLN
500.0000 [IU] | Freq: Once | INTRAVENOUS | Status: AC | PRN
  Administered 2021-10-19: 500 [IU]

## 2021-10-19 MED ORDER — PROCHLORPERAZINE MALEATE 10 MG PO TABS: 10.0000 mg | ORAL_TABLET | Freq: Once | ORAL | Status: DC

## 2021-10-19 MED ORDER — SODIUM CHLORIDE 0.9 % IV SOLN: 150.0000 mg | Freq: Once | INTRAVENOUS | Status: DC

## 2021-10-19 MED ORDER — SODIUM CHLORIDE 0.9 % IV SOLN: 10.0000 mg | Freq: Once | INTRAVENOUS | Status: DC

## 2021-10-19 MED ORDER — SODIUM CHLORIDE 0.9% FLUSH
10.0000 mL | Freq: Once | INTRAVENOUS | Status: AC
  Administered 2021-10-19: 10 mL

## 2021-10-19 NOTE — Patient Instructions (Signed)
[facility]  Discharge Instructions: Thank you for choosing [facility] to provide your oncology and hematology care.   If you have a lab appointment with the [facility], please go directly to the [facility] and check in at the registration area.   Wear comfortable clothing and clothing appropriate for easy access to any Portacath or PICC line.   We strive to give you quality time with your provider. You may need to reschedule your appointment if you arrive late (15 or more minutes).  Arriving late affects you and other patients whose appointments are after yours.  Also, if you miss three or more appointments without notifying the office, you may be dismissed from the clinic at the providers discretion.      For prescription refill requests, have your pharmacy contact our office and allow 72 hours for refills to be completed.    Today you received the following chemotherapy and/or immunotherapy agents: Pembrolizumab (Keytruda),  Pemetrexed (Alimta).   To help prevent nausea and vomiting after your treatment, we encourage you to take your nausea medication as directed.  BELOW ARE SYMPTOMS THAT SHOULD BE REPORTED IMMEDIATELY: *FEVER GREATER THAN 100.4 F (38 C) OR HIGHER *CHILLS OR SWEATING *NAUSEA AND VOMITING THAT IS NOT CONTROLLED WITH YOUR NAUSEA MEDICATION *UNUSUAL SHORTNESS OF BREATH *UNUSUAL BRUISING OR BLEEDING *URINARY PROBLEMS (pain or burning when urinating, or frequent urination) *BOWEL PROBLEMS (unusual diarrhea, constipation, pain near the anus) TENDERNESS IN MOUTH AND THROAT WITH OR WITHOUT PRESENCE OF ULCERS (sore throat, sores in mouth, or a toothache) UNUSUAL RASH, SWELLING OR PAIN  UNUSUAL VAGINAL DISCHARGE OR ITCHING   Items with * indicate a potential emergency and should be followed up as soon as possible or go to the Emergency Department if any problems should occur.  Please show the CHEMOTHERAPY ALERT CARD or  IMMUNOTHERAPY ALERT CARD at check-in to the Emergency Department and triage nurse.  Should you have questions after your visit or need to cancel or reschedule your appointment, please contact [facility]  Dept: [phone]  and follow the prompts.  Office hours are 8:00 a.m. to 4:30 p.m. Monday - Friday. Please note that voicemails left after 4:00 p.m. may not be returned until the following business day.  We are closed weekends and major holidays. You have access to a nurse at all times for urgent questions. Please call the main number to the clinic Dept: [phone] and follow the prompts.   For any non-urgent questions, you may also contact your provider using MyChart. We now offer e-Visits for anyone 72 and older to request care online for non-urgent symptoms. For details visit [email or web address].   Also download the MyChart app! Go to the app store, search "MyChart", open the app, select [facility], and log in with your MyChart username and password.  Due to Covid, a mask is required upon entering the hospital/clinic. If you do not have a mask, one will be given to you upon arrival. For doctor visits, patients may have 1 support person aged 52 or older with them. For treatment visits, patients cannot have anyone with them due to current Covid guidelines and our immunocompromised population.   Pembrolizumab injection What is this medication? PEMBROLIZUMAB (pem broe liz ue mab) is a monoclonal antibody. It is used to treat certain types of cancer. This medicine may be used for other purposes; ask your health care provider or pharmacist if you have questions. COMMON BRAND NAME(S): [employer]  What should I tell my care team before I take this medication? They need to know if you have any of these conditions: autoimmune diseases like Crohn's disease, ulcerative colitis, or lupus have had or planning to have an allogeneic stem cell transplant (uses someone else's  stem cells) history of organ transplant history of chest radiation nervous system problems like myasthenia gravis or Guillain-Barre syndrome an unusual or allergic reaction to pembrolizumab, other medicines, foods, dyes, or preservatives pregnant or trying to get pregnant breast-feeding How should I use this medication? This medicine is for infusion into a vein. It is given by a health care professional in a hospital or clinic setting. A special MedGuide will be given to you before each treatment. Be sure to read this information carefully each time. Talk to your pediatrician regarding the use of this medicine in children. While this drug may be prescribed for children as young as 6 months for selected conditions, precautions do apply. Overdosage: If you think you have taken too much of this medicine contact a poison control center or emergency room at once. NOTE: This medicine is only for you. Do not share this medicine with others. What if I miss a dose? It is important not to miss your dose. Call your doctor or health care professional if you are unable to keep an appointment. What may interact with this medication? Interactions have not been studied. This list may not describe all possible interactions. Give your health care provider a list of all the medicines, herbs, non-prescription drugs, or dietary supplements you use. Also tell them if you smoke, drink alcohol, or use illegal drugs. Some items may interact with your medicine. What should I watch for while using this medication? Your condition will be monitored carefully while you are receiving this medicine. You may need blood work done while you are taking this medicine. Do not become pregnant while taking this medicine or for 4 months after stopping it. Women should inform their doctor if they wish to become pregnant or think they might be pregnant. There is a potential for serious side effects to an unborn child. Talk to your health  care professional or pharmacist for more information. Do not breast-feed an infant while taking this medicine or for 4 months after the last dose. What side effects may I notice from receiving this medication? Side effects that you should report to your doctor or health care professional as soon as possible: allergic reactions like skin rash, itching or hives, swelling of the face, lips, or tongue bloody or black, tarry breathing problems changes in vision chest pain chills confusion constipation cough diarrhea dizziness or feeling faint or lightheaded fast or irregular heartbeat fever flushing joint pain low blood counts - this medicine may decrease the number of white blood cells, red blood cells and platelets. You may be at increased risk for infections and bleeding. muscle pain muscle weakness pain, tingling, numbness in the hands or feet persistent headache redness, blistering, peeling or loosening of the skin, including inside the mouth signs and symptoms of high blood sugar such as dizziness; dry mouth; dry skin; fruity breath; nausea; stomach pain; increased hunger or thirst; increased urination signs and symptoms of kidney injury like trouble passing urine or change in the amount of urine signs and symptoms of liver injury like dark urine, light-colored stools, loss of appetite, nausea, right upper belly pain, yellowing of the eyes or skin sweating swollen lymph nodes weight loss Side effects that usually do not require  medical attention (report to your doctor or health care professional if they continue or are bothersome): decreased appetite hair loss tiredness This list may not describe all possible side effects. Call your doctor for medical advice about side effects. You may report side effects to FDA at 1-800-FDA-1088. Where should I keep my medication? This drug is given in a hospital or clinic and will not be stored at home. NOTE: This sheet is a summary. It may  not cover all possible information. If you have questions about this medicine, talk to your doctor, pharmacist, or health care provider.  2022 Elsevier/Gold Standard (2021-05-05 00:00:00)  Pemetrexed injection What is this medication? PEMETREXED (PEM e TREX ed) is a chemotherapy drug used to treat lung cancers like non-small cell lung cancer and mesothelioma. It may also be used to treat other cancers. This medicine may be used for other purposes; ask your health care provider or pharmacist if you have questions. COMMON BRAND NAME(S): Alimta, PEMFEXY What should I tell my care team before I take this medication? They need to know if you have any of these conditions: infection (especially a virus infection such as chickenpox, cold sores, or herpes) kidney disease low blood counts, like low white cell, platelet, or red cell counts lung or breathing disease, like asthma radiation therapy an unusual or allergic reaction to pemetrexed, other medicines, foods, dyes, or preservative pregnant or trying to get pregnant breast-feeding How should I use this medication? This drug is given as an infusion into a vein. It is administered in a hospital or clinic by a specially trained health care professional. Talk to your pediatrician regarding the use of this medicine in children. Special care may be needed. Overdosage: If you think you have taken too much of this medicine contact a poison control center or emergency room at once. NOTE: This medicine is only for you. Do not share this medicine with others. What if I miss a dose? It is important not to miss your dose. Call your doctor or health care professional if you are unable to keep an appointment. What may interact with this medication? This medicine may interact with the following medications: Ibuprofen This list may not describe all possible interactions. Give your health care provider a list of all the medicines, herbs, non-prescription drugs,  or dietary supplements you use. Also tell them if you smoke, drink alcohol, or use illegal drugs. Some items may interact with your medicine. What should I watch for while using this medication? Visit your doctor for checks on your progress. This drug may make you feel generally unwell. This is not uncommon, as chemotherapy can affect healthy cells as well as cancer cells. Report any side effects. Continue your course of treatment even though you feel ill unless your doctor tells you to stop. In some cases, you may be given additional medicines to help with side effects. Follow all directions for their use. Call your doctor or health care professional for advice if you get a fever, chills or sore throat, or other symptoms of a cold or flu. Do not treat yourself. This drug decreases your body's ability to fight infections. Try to avoid being around people who are sick. This medicine may increase your risk to bruise or bleed. Call your doctor or health care professional if you notice any unusual bleeding. Be careful brushing and flossing your teeth or using a toothpick because you may get an infection or bleed more easily. If you have any dental work done, tell  your dentist you are receiving this medicine. Avoid taking products that contain aspirin, acetaminophen, ibuprofen, naproxen, or ketoprofen unless instructed by your doctor. These medicines may hide a fever. Call your doctor or health care professional if you get diarrhea or mouth sores. Do not treat yourself. To protect your kidneys, drink water or other fluids as directed while you are taking this medicine. Do not become pregnant while taking this medicine or for 6 months after stopping it. Women should inform their doctor if they wish to become pregnant or think they might be pregnant. Men should not father a child while taking this medicine and for 3 months after stopping it. This may interfere with the ability to father a child. You should talk  to your doctor or health care professional if you are concerned about your fertility. There is a potential for serious side effects to an unborn child. Talk to your health care professional or pharmacist for more information. Do not breast-feed an infant while taking this medicine or for 1 week after stopping it. What side effects may I notice from receiving this medication? Side effects that you should report to your doctor or health care professional as soon as possible: allergic reactions like skin rash, itching or hives, swelling of the face, lips, or tongue breathing problems redness, blistering, peeling or loosening of the skin, including inside the mouth signs and symptoms of bleeding such as bloody or black, tarry stools; red or dark-brown urine; spitting up blood or brown material that looks like coffee grounds; red spots on the skin; unusual bruising or bleeding from the eye, gums, or nose signs and symptoms of infection like fever or chills; cough; sore throat; pain or trouble passing urine signs and symptoms of kidney injury like trouble passing urine or change in the amount of urine signs and symptoms of liver injury like dark yellow or brown urine; general ill feeling or flu-like symptoms; light-colored stools; loss of appetite; nausea; right upper belly pain; unusually weak or tired; yellowing of the eyes or skin Side effects that usually do not require medical attention (report to your doctor or health care professional if they continue or are bothersome): constipation mouth sores nausea, vomiting unusually weak or tired This list may not describe all possible side effects. Call your doctor for medical advice about side effects. You may report side effects to FDA at 1-800-FDA-1088. Where should I keep my medication? This drug is given in a hospital or clinic and will not be stored at home. NOTE: This sheet is a summary. It may not cover all possible information. If you have  questions about this medicine, talk to your doctor, pharmacist, or health care provider.  2022 Elsevier/Gold Standard (2017-10-11 00:00:00)

## 2021-10-19 NOTE — Progress Notes (Signed)
Has armband been applied?  Yes  Does patient have an allergy to IV contrast dye?: No   Has patient ever received premedication for IV contrast dye?:  n/a  Does patient take metformin?: No  If patient does take metformin when was the last dose: n/a  Date of lab work: 10/14/2021 BUN: 15 CR: 0.87 eGfr: >60  IV site: Right Chest Port  Has IV site been added to flowsheet? Yes    BP 119/79 (BP Location: Left Arm, Patient Position: Sitting)    Pulse (!) 115    Temp (!) 96.7 F (35.9 C) (Temporal)    Resp 18    Ht 5' 9"  (1.753 m)    Wt 169 lb (76.7 kg)    SpO2 97%    BMI 24.96 kg/m     [name] M. Leonie Green, BSN

## 2021-10-19 NOTE — Progress Notes (Signed)
Per PA pt to only get [place] and alimta today. Per Pharmacy pre-meds changed to compazine only.  Pt reports taking compazine at 9am, per PA hold for today. Pt made aware.

## 2021-10-19 NOTE — Progress Notes (Signed)
[place]  [phone] [phone]) [phone]   Name: [name] Date: 10/19/2021 MRN: [id number]  DOB: May 24, 1956  Patient Care Team: [name], MD as PCP - General (Family Medicine) [name], MD as PCP - Cardiology (Cardiology) Icard, Octavio Graves, DO as Consulting Physician (Pulmonary Disease) Valrie Hart, RN as Oncology Nurse Navigator (Oncology) Maryanna Shape, NP as Nurse Practitioner (Oncology) Curt Bears, MD as Consulting Physician (Oncology) Pickenpack-Cousar, Carlena Sax, NP as Nurse Practitioner (Nurse Practitioner)    INTERVAL HISTORY: [name] is a 66 y.o. 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 He was admitted on 08/29/2021 and 09/15/21 from home with uncontrolled intractable chest wall pain. He has completed all of his radiation treatments. Now with plans to undergo immunotherapy as recommended by [facility].   SOCIAL HISTORY:     reports that he quit smoking about 5 years ago. His smoking use included cigarettes. He has a 33.00 pack-year smoking history. He has never used smokeless tobacco. He reports that he does not currently use alcohol. He reports that he does not use drugs.  ADVANCE DIRECTIVES:  None on file   CODE STATUS:   PAST MEDICAL HISTORY: Past Medical History:  Diagnosis Date   Anemia    Anxiety    Arthritis    Basal cell carcinoma (BCC) of forehead    CAD (coronary artery disease)    a. 10/2015 ant STEMI >> LHC with 3 v CAD; oLAD tx with POBA >> emergent CABG. b. Multiple evals since that time,  early graft failure of SVG-RCA by cath 03/2016. c. 2/19 PCI/DES x1 to pRCA, normal EF.   Carotid artery disease ([place])    a. 40-59% BICA 02/2018.   Depression    Dyspnea    Ectopic atrial tachycardia (HCC)    Esophageal reflux    eosinophil esophagitis   Family history of adverse reaction to anesthesia    "sister has PONV" (06/21/2017)   Former tobacco use    Gout    Hepatitis C    "treated and cured" (06/21/2017)   High cholesterol    History of blood transfusion    History of kidney stones    Hypertension    Ischemic cardiomyopathy    a. EF 25-30% at intraop TEE 4/17  //  b. Limited Echo 5/17 - EF 45-50%, mild ant HK. c. EF 55-65% by cath 09/2017.   Migraine    "3-4/yr" (06/21/2017)   Myocardial infarction ([place]) 10/2015   Palpitations    Pneumonia    Sinus bradycardia    a. HR dropping into 40s in 02/2016 -> BB reduced.   Stroke [facility]) 10/2016   "small one; sometimes my memory/cognitive issues" (06/21/2017)   Symptomatic hypotension    a. 02/2016 ER visit -> meds reduced.   Syncope    Wears dentures    Wears glasses     ALLERGIES:  is allergic to prednisone, tetanus toxoids, wellbutrin [bupropion], and varenicline.  MEDICATIONS:  Current Outpatient Medications  Medication Sig Dispense Refill   acetaminophen (TYLENOL) 500 MG tablet Take 1,000 mg by mouth every 6 (six) hours as needed for mild pain, fever or  headache.     aspirin EC 81 MG tablet Take 81 mg by mouth daily. Swallow whole.     docusate sodium (COLACE) 100 MG capsule Take 100-200 mg by mouth daily.     DULoxetine (CYMBALTA) 30 MG capsule Take 1 capsule (30 mg total) by mouth 2 (two) times daily. 60 capsule 1   folic acid (FOLVITE) 1 MG tablet Take 1 tablet (1 mg total) by mouth daily. 30 tablet 4   gabapentin (NEURONTIN) 300 MG capsule Take 2 capsules (600 mg total) by mouth 3 (three) times daily. 180 capsule 2   HYDROmorphone (DILAUDID) 4 MG tablet Take 1-2 tablets (4-8 mg total) by mouth every 3 (three) hours as  needed for severe pain. 120 tablet 0   lidocaine (LIDODERM) 5 % Place 1 patch onto the skin daily as needed (for pain- Remove & Discard patch within 12 hours or as directed by MD).     lidocaine-prilocaine (EMLA) cream Apply to the Port-A-Cath site 30-60-minute before treatment (Patient taking differently: Apply 1 application topically See admin instructions. Apply to the Port-A-Cath site 30-60-minutes before treatment) 30 g 0   magnesium oxide (MAG-OX) 400 (240 Mg) MG tablet Take 1 tablet (400 mg total) by mouth daily. 30 tablet 1   metoprolol succinate (TOPROL-XL) 25 MG 24 hr tablet Take 1 tablet (25 mg total) by mouth daily. (Patient taking differently: Take 25 mg by mouth every evening.) 90 tablet 3   midodrine (PROAMATINE) 2.5 MG tablet Take 2.5 mg by mouth 3 (three) times daily as needed (AS DIRECTED).     morphine (MS CONTIN) 60 MG 12 hr tablet Take 1 tablet (60 mg total) by mouth every 8 (eight) hours. **10/02/2021 90 tablet 0   Multiple Vitamin (MULTIVITAMIN WITH MINERALS) TABS tablet Take 1 tablet by mouth daily.     ondansetron (ZOFRAN) 8 MG tablet Take 1 tablet (8 mg total) by mouth every 8 (eight) hours as needed for nausea or vomiting. Starting 3 days after chemotherapy 90 tablet 2   pantoprazole (PROTONIX) 40 MG tablet Take 1 tablet by mouth daily at 6 AM. 30 tablet 1   polyethylene glycol (MIRALAX / GLYCOLAX) 17 g packet Take 17 g by mouth daily. (Patient not taking: Reported on 10/02/2021) 14 each 0   prochlorperazine (COMPAZINE) 10 MG tablet Take 1 tablet (10 mg total) by mouth every 6 (six) hours as needed for nausea or vomiting. 30 tablet 0   ranolazine (RANEXA) 1000 MG SR tablet Take 1 tablet (1,000 mg total) by mouth 2 (two) times daily. 180 tablet 3   senna-docusate (SENOKOT-S) 8.6-50 MG tablet Take 1 tablet by mouth 2 (two) times daily.     tiZANidine (ZANAFLEX) 2 MG tablet Take 1 tablet (2 mg total) by mouth at bedtime as needed for muscle spasms. (Patient taking differently:  Take 2 mg by mouth at bedtime.) 30 tablet 0   XARELTO 20 MG TABS tablet Take 20 mg by mouth every evening.     No current facility-administered medications for this visit.   Facility-Administered Medications Ordered in Other Visits  Medication Dose Route Frequency Provider Last Rate Last Admin   sodium chloride flush (NS) 0.9 % injection 10 mL  10 mL Intracatheter PRN Curt Bears, MD   10 mL at 10/19/21 1407    VITAL SIGNS: There were no vitals taken for this visit. There were no vitals filed for this visit.  Estimated body mass index is 25.57 kg/m as calculated from the following:   Height  as of 10/14/21: 5\' 9"  (1.753 m).   Weight as of an earlier encounter on 10/19/21: 173 lb 2 oz (78.5 kg).   PERFORMANCE STATUS (ECOG) : 1 - Symptomatic but completely ambulatory   Physical Exam General: NAD Pulmonary: normal breathing pattern  Cardio: RRR Skin: Right side lower back radiation irritation improved, no open areas Neurological: AAOx 4, appears comfortable  IMPRESSION:  [name] is here for follow-up. He is appreciative of how well he is doing. Reports his pain is well controlled on current regimen and his quality of life is improved. Shares he was able to go to dinner with some friends recently which he enjoyed. He is scheduled for cycle 2 of his treatment today with plans to have additional simulation and radiation treatments to start later this week.   Neoplasm related pain Recently discharged from hospital on 10/08/21. His pain regimen was adjusted to gain better and continuous control. Hopefully to prevent further hospitalizations. At discharge regimen consist of tizanidine, MS Contin 60 mg, Dilaudid 6-8 mg, gabapentin 300 mg, and Cymbalta 30 mg. Unfortunately given his extensive disease this will be ongoing and realistic expectations are to gain somewhat better control with awareness pain will not completely resolve. If pain further escalates consideration for a more comfort  hospice focus would be most appropriate in order to provide him most sufficient pain control. Will continue to closely monitor and support in any way that we can.   Constipation No concerns at this time. Will continue on current bowel regimen of colace and miralax daily to prevent constipation in the setting of opioid use.   Decreased appetite/weight loss [name] reports appetite is improved. Some days better than others but he is able to tolerate meals and snacks. Denies any concerns at this time with nutrition. Current weight 169 lbs down from 174lbs on 1/16.   Goals of care [name] is aware that his cancer is He is remaining hopeful for some stability and extended time with his current treatment options.   PLAN: MS Contin ER 60 mg every 8 hours (new prescription will be sent in for 30 day supply on 10/02/21 (was given a 14 day supply at discharge on 1/22. Next refill due on 10/31/2021).  Dilaudid 6-8 mg every 3-4 hrs as needed for breakthrough pain. (Refills sent in on 10/16/21 to cover as he wold run out on 2/19. Next RX will be due on  10/25/21).  Gabapentin 300 mg three times daily (refilled by Dr. [name] on 2/16 with 2 refills provided) Tizanidine 2mg  QHS as needed for muscle spasms. (Refills will be due on 10/30/21), Miralax and Colace daily for bowel regimen  I will plan to see him back in 3-4 weeks in collaboration with his other oncology appointments.  He knows to call with any needs prior to next visit.   Patient expressed understanding and was in agreement with this plan. He also understands that He can call the clinic at any time with any questions, concerns, or complaints.   Time Total: 20 min.  Visit consisted of counseling and education dealing with the complex and emotionally intense issues of symptom management and palliative care in the setting of serious and potentially life-threatening illness.Greater than 50%  of this time was spent counseling and coordinating care related to  the above assessment and plan.  Signed by: Alda Lea, AGPCNP-BC [place]

## 2021-10-20 ENCOUNTER — Ambulatory Visit
Admission: RE | Admit: 2021-10-20 | Discharge: 2021-10-20 | Disposition: A | Discharge status: Home / Self Care | Payer: Medicare Other | Source: Ambulatory Visit | Attending: Radiation Oncology | Admitting: Radiation Oncology

## 2021-10-20 ENCOUNTER — Telehealth: Payer: Self-pay | Admitting: Nurse Practitioner

## 2021-10-20 VITALS — BP 119/79 | HR 115 | Temp 96.7°F | Resp 18 | Ht 69.0 in | Wt 169.0 lb

## 2021-10-20 DIAGNOSIS — C7931 Secondary malignant neoplasm of brain: Secondary | ICD-10-CM | POA: Insufficient documentation

## 2021-10-20 MED ORDER — SODIUM CHLORIDE 0.9% FLUSH
10.0000 mL | Freq: Once | INTRAVENOUS | Status: AC
  Administered 2021-10-20: 10 mL via INTRAVENOUS

## 2021-10-20 MED ORDER — HEPARIN SOD (PORK) LOCK FLUSH 100 UNIT/ML IV SOLN
500.0000 [IU] | Freq: Once | INTRAVENOUS | Status: AC
  Administered 2021-10-20: 500 [IU] via INTRAVENOUS

## 2021-10-20 NOTE — Telephone Encounter (Signed)
Scheduled oer 2/20 los, pt has been called and confirmed

## 2021-10-23 ENCOUNTER — Other Ambulatory Visit (HOSPITAL_COMMUNITY): Payer: Self-pay

## 2021-10-23 ENCOUNTER — Telehealth: Payer: Self-pay

## 2021-10-23 ENCOUNTER — Other Ambulatory Visit: Payer: Self-pay | Admitting: Nurse Practitioner

## 2021-10-23 MED ORDER — MORPHINE SULFATE ER 60 MG PO TBCR
60.0000 mg | EXTENDED_RELEASE_TABLET | Freq: Three times a day (TID) | ORAL | 0 refills | Status: DC
  Filled 2021-11-02: qty 90, 30d supply, fill #0

## 2021-10-23 MED ORDER — HYDROMORPHONE HCL 8 MG PO TABS
8.0000 mg | ORAL_TABLET | ORAL | 0 refills | Status: DC | PRN
  Filled 2021-10-23: qty 120, 15d supply, fill #0

## 2021-10-23 NOTE — Telephone Encounter (Signed)
Mr. Bonn called our office to let us know that he would need a refill on his Dilaudid. I called to clarify how many tablets he was taking per dose and he stated that he more often than not was taking two tablets, for a total of 8mg . Nikki, NP stated she would send in a prescription for 8mg  tablets so that he would only have to take one tablet at a time. I relayed this to Mr. Winsor and explained that he could space out the time he takes the Dilaudid if his pain is controlled longer. Understanding verbalized. All questions answered. Advised to call our office with any questions/concerns.

## 2021-10-24 ENCOUNTER — Other Ambulatory Visit (HOSPITAL_COMMUNITY): Payer: Self-pay

## 2021-10-27 ENCOUNTER — Other Ambulatory Visit: Payer: Self-pay

## 2021-10-27 ENCOUNTER — Ambulatory Visit
Admission: RE | Admit: 2021-10-27 | Discharge: 2021-10-27 | Disposition: A | Discharge status: Home / Self Care | Payer: Medicare Other | Source: Ambulatory Visit | Attending: Radiation Oncology | Admitting: Radiation Oncology

## 2021-10-27 ENCOUNTER — Encounter: Payer: Self-pay | Admitting: Radiation Oncology

## 2021-10-27 DIAGNOSIS — C7931 Secondary malignant neoplasm of brain: Secondary | ICD-10-CM | POA: Diagnosis not present

## 2021-10-27 NOTE — Op Note (Signed)
Name: [name]    MRN: [id number]   Date: 10/27/2021    DOB: 1956/05/03   STEREOTACTIC RADIOSURGERY OPERATIVE NOTE  PRE-OPERATIVE DIAGNOSIS:  Metastatic neuroendocrine lung CA  POST-OPERATIVE DIAGNOSIS:  Same  PROCEDURE:  Stereotactic Radiosurgery  SURGEON:  Consuella Lose, MD  RADIATION ONCOLOGIST: Dr. Kyung Rudd, MD  TECHNIQUE:  The patient underwent a radiation treatment planning session in the radiation oncology simulation suite under the care of the radiation oncology physician and physicist.  I participated closely in the radiation treatment planning afterwards. The patient underwent planning CT which was fused to 3T high resolution MRI with 1 mm axial slices.  These images were fused on the planning system.  We contoured the gross target volumes and subsequently expanded this to yield the Planning Target Volume. I actively participated in the planning process.  I helped to define and review the target contours and also the contours of the optic pathway, eyes, brainstem and selected nearby organs at risk.  All the dose constraints for critical structures were reviewed and compared to AAPM Task Group 101.  The prescription dose conformity was reviewed.  I approved the plan electronically.    Accordingly, [name]  was brought to the TrueBeam stereotactic radiation treatment linac and placed in the custom immobilization mask.  The patient was aligned according to the IR fiducial markers with BrainLab Exactrac, then orthogonal x-rays were used in ExacTrac with the 6DOF robotic table and the shifts were made to align the patient  [name] received stereotactic radiosurgery to a prescription dose of 20Gy to the left thalamic/ganglionic lesion uneventfully.    The detailed description of the procedure is recorded in the radiation oncology procedure note.  I was present for the duration of the procedure.  DISPOSITION:   Following delivery, the patient was transported  to nursing in stable condition and monitored for possible acute effects to be discharged to home in stable condition.  Consuella Lose, MD [facility]

## 2021-10-27 NOTE — Progress Notes (Signed)
Mr. Pettingill rested with [name] for 30 minutes following his SRS treatment.  Patient denies headache, dizziness, nausea, diplopia or ringing in the ears. Denies fatigue. Patient without complaints. Understands to avoid strenuous activity for the next 24 hours and call [phone] with needs.   BP 127/76 (BP Location: Left Arm)    Pulse 88    Temp (!) 96.7 F (35.9 C) (Temporal)    Resp 18    SpO2 100%    Kayveon Lennartz M. Leonie Green, BSN

## 2021-10-28 ENCOUNTER — Encounter: Payer: Medicare Other | Admitting: Nurse Practitioner

## 2021-10-28 ENCOUNTER — Telehealth: Payer: Self-pay | Admitting: Internal Medicine

## 2021-10-28 ENCOUNTER — Other Ambulatory Visit: Payer: Medicare Other

## 2021-10-28 ENCOUNTER — Inpatient Hospital Stay: Payer: Medicare Other

## 2021-10-28 NOTE — Telephone Encounter (Signed)
Rescheduled 03/01 appointment to 03/03 per patient's request. ?

## 2021-10-29 ENCOUNTER — Telehealth: Payer: Self-pay

## 2021-10-29 ENCOUNTER — Other Ambulatory Visit: Payer: Self-pay | Admitting: Physician Assistant

## 2021-10-29 DIAGNOSIS — M79605 Pain in left leg: Secondary | ICD-10-CM

## 2021-10-29 NOTE — Telephone Encounter (Signed)
Pt called after hours stating he has swelling in his left foot that is also painful.  ? ?Discussed with [place] PA-C who has submitted an order for and [name] of his LLL. ? ?Pt has PF w/labs tomorrow 10/30/21 and I have called and scheduled pts [name]. I have also advised the pt of this and he expressed understanding of this information. ?

## 2021-10-30 ENCOUNTER — Encounter: Payer: Self-pay | Admitting: Physician Assistant

## 2021-10-30 ENCOUNTER — Other Ambulatory Visit: Payer: Self-pay

## 2021-10-30 ENCOUNTER — Inpatient Hospital Stay: Payer: Medicare Other | Attending: Physician Assistant

## 2021-10-30 ENCOUNTER — Other Ambulatory Visit: Payer: Self-pay | Admitting: Physician Assistant

## 2021-10-30 ENCOUNTER — Ambulatory Visit (HOSPITAL_COMMUNITY)
Admission: RE | Admit: 2021-10-30 | Discharge: 2021-10-30 | Disposition: A | Discharge status: Home / Self Care | Payer: Medicare Other | Source: Ambulatory Visit | Attending: Physician Assistant | Admitting: Physician Assistant

## 2021-10-30 DIAGNOSIS — C7A8 Other malignant neuroendocrine tumors: Secondary | ICD-10-CM | POA: Insufficient documentation

## 2021-10-30 DIAGNOSIS — Z79899 Other long term (current) drug therapy: Secondary | ICD-10-CM | POA: Diagnosis not present

## 2021-10-30 DIAGNOSIS — C7B8 Other secondary neuroendocrine tumors: Secondary | ICD-10-CM | POA: Insufficient documentation

## 2021-10-30 DIAGNOSIS — Z5112 Encounter for antineoplastic immunotherapy: Secondary | ICD-10-CM | POA: Diagnosis not present

## 2021-10-30 DIAGNOSIS — D61810 Antineoplastic chemotherapy induced pancytopenia: Secondary | ICD-10-CM | POA: Diagnosis not present

## 2021-10-30 DIAGNOSIS — M79605 Pain in left leg: Secondary | ICD-10-CM | POA: Diagnosis present

## 2021-10-30 DIAGNOSIS — R5382 Chronic fatigue, unspecified: Secondary | ICD-10-CM

## 2021-10-30 DIAGNOSIS — G893 Neoplasm related pain (acute) (chronic): Secondary | ICD-10-CM | POA: Insufficient documentation

## 2021-10-30 DIAGNOSIS — Z95828 Presence of other vascular implants and grafts: Secondary | ICD-10-CM

## 2021-10-30 DIAGNOSIS — Z5111 Encounter for antineoplastic chemotherapy: Secondary | ICD-10-CM | POA: Diagnosis present

## 2021-10-30 DIAGNOSIS — C3491 Malignant neoplasm of unspecified part of right bronchus or lung: Secondary | ICD-10-CM

## 2021-10-30 DIAGNOSIS — D6481 Anemia due to antineoplastic chemotherapy: Secondary | ICD-10-CM

## 2021-10-30 LAB — CMP (CANCER CENTER ONLY)
ALT: 18 U/L (ref 0–44)
AST: 24 U/L (ref 15–41)
Albumin: 3.5 g/dL (ref 3.5–5.0)
Alkaline Phosphatase: 110 U/L (ref 38–126)
Anion gap: 7 (ref 5–15)
BUN: 16 mg/dL (ref 8–23)
CO2: 27 mmol/L (ref 22–32)
Calcium: 9.2 mg/dL (ref 8.9–10.3)
Chloride: 100 mmol/L (ref 98–111)
Creatinine: 0.83 mg/dL (ref 0.61–1.24)
GFR, Estimated: >60 mL/min (ref >60)
Glucose, Bld: 99 mg/dL (ref 70–99)
Potassium: 4.2 mmol/L (ref 3.5–5.1)
Sodium: 134 mmol/L — ABNORMAL LOW (ref 135–145)
Total Bilirubin: 0.5 mg/dL (ref 0.3–1.2)
Total Protein: 7 g/dL (ref 6.5–8.1)

## 2021-10-30 LAB — SAMPLE TO BLOOD BANK

## 2021-10-30 LAB — CBC WITH DIFFERENTIAL (CANCER CENTER ONLY)
Abs Immature Granulocytes: 0.07 10*3/uL (ref 0.00–0.07)
Basophils Absolute: 0 10*3/uL (ref 0.0–0.1)
Basophils Relative: 0 %
Eosinophils Absolute: 0 10*3/uL (ref 0.0–0.5)
Eosinophils Relative: 0 %
HCT: 26 % — ABNORMAL LOW (ref 39.0–52.0)
Hemoglobin: 8.4 g/dL — ABNORMAL LOW (ref 13.0–17.0)
Immature Granulocytes: 2 %
Lymphocytes Relative: 16 %
Lymphs Abs: 0.7 10*3/uL (ref 0.7–4.0)
MCH: 30.5 pg (ref 26.0–34.0)
MCHC: 32.3 g/dL (ref 30.0–36.0)
MCV: 94.5 fL (ref 80.0–100.0)
Monocytes Absolute: 1.9 10*3/uL — ABNORMAL HIGH (ref 0.1–1.0)
Monocytes Relative: 40 %
Neutro Abs: 2 10*3/uL (ref 1.7–7.7)
Neutrophils Relative %: 42 %
Platelet Count: 154 10*3/uL (ref 150–400)
RBC: 2.75 MIL/uL — ABNORMAL LOW (ref 4.22–5.81)
RDW: 18.4 % — ABNORMAL HIGH (ref 11.5–15.5)
WBC Count: 4.6 10*3/uL (ref 4.0–10.5)
nRBC: 0 % (ref 0.0–0.2)

## 2021-10-30 LAB — TSH: TSH: 0.58 u[IU]/mL (ref 0.320–4.118)

## 2021-10-30 MED ORDER — HEPARIN SOD (PORK) LOCK FLUSH 100 UNIT/ML IV SOLN
500.0000 [IU] | Freq: Once | INTRAVENOUS | Status: AC
  Administered 2021-10-30: 500 [IU]

## 2021-10-30 MED ORDER — SODIUM CHLORIDE 0.9% FLUSH
10.0000 mL | Freq: Once | INTRAVENOUS | Status: AC
  Administered 2021-10-30: 10 mL

## 2021-10-30 NOTE — Telephone Encounter (Signed)
I have called the pt and left a detailed message advising he is negative for DVT. I also wanted to confirm that he is only having swelling and pain with no redness. Awaiting a return call. ?

## 2021-10-30 NOTE — Progress Notes (Signed)
Left lower extremity venous duplex has been completed. ?Preliminary results can be found in CV Proc through chart review.  ?Results were given to Ansyi at [place] office. ? ?10/30/21 11:16 AM ?Carlos Levering RVT   ?

## 2021-11-02 ENCOUNTER — Other Ambulatory Visit (HOSPITAL_COMMUNITY): Payer: Self-pay

## 2021-11-02 ENCOUNTER — Encounter: Payer: Self-pay | Admitting: Physician Assistant

## 2021-11-02 ENCOUNTER — Telehealth: Payer: Self-pay

## 2021-11-02 NOTE — Progress Notes (Signed)
° °                                                                                                                                                          °  Patient Name: [name] MRN: [id number] DOB: March 18, 1956 Referring Physician: Curt Bears (Profile Not Attached) Date of Service: 10/27/2021 [facility], [place]                                                        End Of Treatment Note  Diagnoses: C79.31-Secondary malignant neoplasm of brain C7B.8-Other secondary neuroendocrine tumors  Cancer Staging:   Stage IV, [id number], large cell neuroendocrine carcinoma of the RUL with brain metastases.   Intent: Palliative  Radiation Treatment Dates: 10/27/2021 SRS Treatment Site Technique Total Dose (Gy) Dose per Fx (Gy) Completed Fx Beam Energies  Brain: Brain [id number] 3D 20/20 20 1/1 [id number]   Narrative: The patient tolerated radiation therapy relatively well.    Plan: The patient will receive a call in about one month from the radiation oncology department and we would anticipate resuming surveillance with brain MRI under Dr. Renda Rolls care.  He will continue follow up with Dr. [name] as well.   ________________________________________________    Carola Rhine, [facility]

## 2021-11-02 NOTE — Telephone Encounter (Signed)
[name] called our office to inform us of his refill for MS Contin. I told him that it was sent to the pharmacy with a note on the day it could be refilled by [name], NP. I advised him to call the pharmacy and let them know so they could refill it. Understanding verbalized. All questions answered. Advised to call our office with any questions/concerns.  ?

## 2021-11-03 ENCOUNTER — Other Ambulatory Visit (HOSPITAL_COMMUNITY): Payer: Self-pay

## 2021-11-04 ENCOUNTER — Other Ambulatory Visit: Payer: Self-pay

## 2021-11-04 ENCOUNTER — Encounter: Payer: Self-pay | Admitting: Nurse Practitioner

## 2021-11-04 ENCOUNTER — Other Ambulatory Visit (HOSPITAL_COMMUNITY): Payer: Self-pay

## 2021-11-04 ENCOUNTER — Telehealth: Payer: Self-pay

## 2021-11-04 ENCOUNTER — Inpatient Hospital Stay (HOSPITAL_BASED_OUTPATIENT_CLINIC_OR_DEPARTMENT_OTHER): Payer: Medicare Other | Admitting: Nurse Practitioner

## 2021-11-04 ENCOUNTER — Inpatient Hospital Stay: Payer: Medicare Other

## 2021-11-04 VITALS — BP 138/80 | HR 103 | Temp 98.6°F | Resp 18 | Ht 69.0 in | Wt 179.1 lb

## 2021-11-04 DIAGNOSIS — C7A1 Malignant poorly differentiated neuroendocrine tumors: Secondary | ICD-10-CM

## 2021-11-04 DIAGNOSIS — K5903 Drug induced constipation: Secondary | ICD-10-CM

## 2021-11-04 DIAGNOSIS — Z95828 Presence of other vascular implants and grafts: Secondary | ICD-10-CM

## 2021-11-04 DIAGNOSIS — Z5112 Encounter for antineoplastic immunotherapy: Secondary | ICD-10-CM | POA: Diagnosis not present

## 2021-11-04 DIAGNOSIS — Z515 Encounter for palliative care: Secondary | ICD-10-CM

## 2021-11-04 DIAGNOSIS — R5382 Chronic fatigue, unspecified: Secondary | ICD-10-CM

## 2021-11-04 DIAGNOSIS — G893 Neoplasm related pain (acute) (chronic): Secondary | ICD-10-CM

## 2021-11-04 DIAGNOSIS — C3491 Malignant neoplasm of unspecified part of right bronchus or lung: Secondary | ICD-10-CM

## 2021-11-04 DIAGNOSIS — D6481 Anemia due to antineoplastic chemotherapy: Secondary | ICD-10-CM

## 2021-11-04 LAB — CBC WITH DIFFERENTIAL (CANCER CENTER ONLY)
Abs Immature Granulocytes: 0.04 10*3/uL (ref 0.00–0.07)
Basophils Absolute: 0 10*3/uL (ref 0.0–0.1)
Basophils Relative: 0 %
Eosinophils Absolute: 0.1 10*3/uL (ref 0.0–0.5)
Eosinophils Relative: 2 %
HCT: 25.5 % — ABNORMAL LOW (ref 39.0–52.0)
Hemoglobin: 7.9 g/dL — ABNORMAL LOW (ref 13.0–17.0)
Immature Granulocytes: 1 %
Lymphocytes Relative: 13 %
Lymphs Abs: 0.9 10*3/uL (ref 0.7–4.0)
MCH: 30.5 pg (ref 26.0–34.0)
MCHC: 31 g/dL (ref 30.0–36.0)
MCV: 98.5 fL (ref 80.0–100.0)
Monocytes Absolute: 1.1 10*3/uL — ABNORMAL HIGH (ref 0.1–1.0)
Monocytes Relative: 17 %
Neutro Abs: 4.5 10*3/uL (ref 1.7–7.7)
Neutrophils Relative %: 67 %
Platelet Count: 273 10*3/uL (ref 150–400)
RBC: 2.59 MIL/uL — ABNORMAL LOW (ref 4.22–5.81)
RDW: 18.7 % — ABNORMAL HIGH (ref 11.5–15.5)
WBC Count: 6.7 10*3/uL (ref 4.0–10.5)
nRBC: 0 % (ref 0.0–0.2)

## 2021-11-04 LAB — CMP (CANCER CENTER ONLY)
ALT: 17 U/L (ref 0–44)
AST: 26 U/L (ref 15–41)
Albumin: 3.5 g/dL (ref 3.5–5.0)
Alkaline Phosphatase: 122 U/L (ref 38–126)
Anion gap: 4 — ABNORMAL LOW (ref 5–15)
BUN: 29 mg/dL — ABNORMAL HIGH (ref 8–23)
CO2: 29 mmol/L (ref 22–32)
Calcium: 9.3 mg/dL (ref 8.9–10.3)
Chloride: 105 mmol/L (ref 98–111)
Creatinine: 1.17 mg/dL (ref 0.61–1.24)
GFR, Estimated: >60 mL/min (ref >60)
Glucose, Bld: 98 mg/dL (ref 70–99)
Potassium: 5.2 mmol/L — ABNORMAL HIGH (ref 3.5–5.1)
Sodium: 138 mmol/L (ref 135–145)
Total Bilirubin: 0.3 mg/dL (ref 0.3–1.2)
Total Protein: 6.7 g/dL (ref 6.5–8.1)

## 2021-11-04 LAB — SAMPLE TO BLOOD BANK

## 2021-11-04 MED ORDER — SODIUM CHLORIDE 0.9% FLUSH
10.0000 mL | Freq: Once | INTRAVENOUS | Status: AC
  Administered 2021-11-04: 10 mL

## 2021-11-04 MED ORDER — HYDROMORPHONE HCL 8 MG PO TABS
8.0000 mg | ORAL_TABLET | ORAL | 0 refills | Status: DC | PRN
  Filled 2021-11-07: qty 120, 15d supply, fill #0

## 2021-11-04 MED ORDER — TIZANIDINE HCL 2 MG PO TABS
2.0000 mg | ORAL_TABLET | Freq: Every evening | ORAL | 2 refills | Status: DC | PRN
  Filled 2021-11-04: qty 30, 30d supply, fill #0

## 2021-11-04 MED ORDER — HEPARIN SOD (PORK) LOCK FLUSH 100 UNIT/ML IV SOLN
500.0000 [IU] | Freq: Once | INTRAVENOUS | Status: AC
  Administered 2021-11-04: 500 [IU]

## 2021-11-04 NOTE — Progress Notes (Signed)
[place]  [phone] [phone]   Name: [name] Date: 11/04/2021 MRN: [id number]  DOB: [birth year]  Patient Care Team: [name], MD as PCP - General (Family Medicine) [name], MD as PCP - Cardiology (Cardiology) Icard, Octavio Graves, DO as Consulting Physician (Pulmonary Disease) Valrie Hart, RN as Oncology Nurse Navigator (Oncology) Maryanna Shape, NP as Nurse Practitioner (Oncology) Curt Bears, MD as Consulting Physician (Oncology) Pickenpack-Cousar, Carlena Sax, NP as Nurse Practitioner (Nurse Practitioner)    INTERVAL HISTORY: [name] is a 66 y.o. 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 He was admitted on 08/29/2021 and 09/15/21 from home with uncontrolled intractable chest wall pain. He has completed all of his radiation treatments. Now with plans to undergo immunotherapy as recommended by [facility]   SOCIAL HISTORY:     reports that he quit smoking about 5 years ago. His smoking use included cigarettes. He has a 33.00 pack-year smoking history. He has never used smokeless tobacco. He reports that he does not currently use alcohol. He reports that he does not use drugs.  ADVANCE DIRECTIVES:  None on file   CODE STATUS:   PAST MEDICAL HISTORY: Past Medical History:  Diagnosis Date   Anemia    Anxiety    Arthritis    Basal cell carcinoma (BCC) of forehead    CAD (coronary artery disease)    a. 10/2015 ant STEMI >> LHC with 3 v CAD; oLAD tx with POBA >> emergent CABG. b. Multiple evals since that time,  early graft failure of SVG-RCA by cath 03/2016. c. 2/19 PCI/DES x1 to pRCA, normal EF.   Carotid artery disease ([place])    a. 40-59% BICA 02/2018.   Depression    Dyspnea    Ectopic atrial tachycardia (HCC)    Esophageal reflux    eosinophil esophagitis   Family history of adverse reaction to anesthesia    "sister has PONV" (06/21/2017)   Former tobacco use    Gout    Hepatitis C    "treated and cured" (06/21/2017)   High cholesterol    History of blood transfusion    History of kidney stones    Hypertension    Ischemic cardiomyopathy    a. EF 25-30% at intraop TEE 4/17  //  b. Limited Echo 5/17 - EF 45-50%, mild ant HK. c. EF 55-65% by cath 09/2017.   Migraine    "3-4/yr" (06/21/2017)   Myocardial infarction ([place]) 10/2015   Palpitations    Pneumonia    Sinus bradycardia    a. HR dropping into 40s in 02/2016 -> BB reduced.   Stroke [facility]) 10/2016   "small one; sometimes my memory/cognitive issues" (06/21/2017)   Symptomatic hypotension    a. 02/2016 ER visit -> meds reduced.   Syncope    Wears dentures    Wears glasses     ALLERGIES:  is allergic to prednisone, tetanus toxoids, wellbutrin [bupropion], and varenicline.  MEDICATIONS:  Current Outpatient Medications  Medication Sig Dispense Refill   acetaminophen (TYLENOL) 500 MG tablet Take 1,000 mg by mouth every 6 (six) hours as needed for mild pain, fever or  headache.     aspirin EC 81 MG tablet Take 81 mg by mouth daily. Swallow whole.     docusate sodium (COLACE) 100 MG capsule Take 100-200 mg by mouth daily.     DULoxetine (CYMBALTA) 30 MG capsule Take 1 capsule (30 mg total) by mouth 2 (two) times daily. 60 capsule 1   folic acid (FOLVITE) 1 MG tablet Take 1 tablet (1 mg total) by mouth daily. 30 tablet 4   gabapentin (NEURONTIN) 300 MG capsule Take 2 capsules (600 mg total) by mouth 3 (three) times daily. 180 capsule 2   [START ON 11/07/2021] HYDROmorphone (DILAUDID) 8 MG tablet Take 1 tablet (8 mg total) by mouth every 3  (three) hours as needed for severe pain. 120 tablet 0   lidocaine (LIDODERM) 5 % Place 1 patch onto the skin daily as needed (for pain- Remove & Discard patch within 12 hours or as directed by MD).     lidocaine-prilocaine (EMLA) cream Apply to the Port-A-Cath site 30-60-minute before treatment (Patient taking differently: Apply 1 application topically See admin instructions. Apply to the Port-A-Cath site 30-60-minutes before treatment) 30 g 0   magnesium oxide (MAG-OX) 400 (240 Mg) MG tablet Take 1 tablet (400 mg total) by mouth daily. 30 tablet 1   metoprolol succinate (TOPROL-XL) 25 MG 24 hr tablet Take 1 tablet (25 mg total) by mouth daily. (Patient taking differently: Take 25 mg by mouth every evening.) 90 tablet 3   midodrine (PROAMATINE) 2.5 MG tablet Take 2.5 mg by mouth 3 (three) times daily as needed (AS DIRECTED).     morphine (MS CONTIN) 60 MG 12 hr tablet Take 1 tablet (60 mg total) by mouth every 8 (eight) hours. 90 tablet 0   Multiple Vitamin (MULTIVITAMIN WITH MINERALS) TABS tablet Take 1 tablet by mouth daily.     ondansetron (ZOFRAN) 8 MG tablet Take 1 tablet (8 mg total) by mouth every 8 (eight) hours as needed for nausea or vomiting. Starting 3 days after chemotherapy 90 tablet 2   pantoprazole (PROTONIX) 40 MG tablet Take 1 tablet by mouth daily at 6 AM. 30 tablet 1   polyethylene glycol (MIRALAX / GLYCOLAX) 17 g packet Take 17 g by mouth daily. (Patient not taking: Reported on 10/02/2021) 14 each 0   prochlorperazine (COMPAZINE) 10 MG tablet Take 1 tablet (10 mg total) by mouth every 6 (six) hours as needed for nausea or vomiting. 30 tablet 0   ranolazine (RANEXA) 1000 MG SR tablet Take 1 tablet (1,000 mg total) by mouth 2 (two) times daily. 180 tablet 3   senna-docusate (SENOKOT-S) 8.6-50 MG tablet Take 1 tablet by mouth 2 (two) times daily.     tiZANidine (ZANAFLEX) 2 MG tablet Take 1 tablet (2 mg total) by mouth at bedtime as needed for muscle spasms. (Patient taking differently:  Take 2 mg by mouth at bedtime.) 30 tablet 0   XARELTO 20 MG TABS tablet Take 20 mg by mouth every evening.     No current facility-administered medications for this visit.    VITAL SIGNS: BP 138/80 (BP Location: Left Arm, Patient Position: Sitting)    Pulse (!) 103    Temp 98.6 F (37 C) (Oral)    Resp 18    Ht 5\' 9"  (1.753 m)    Wt 179 lb 1.6 oz (81.2 kg)    SpO2 100%    BMI 26.45 kg/m  Filed Weights   11/04/21 1302  Weight: 179 lb 1.6 oz (81.2 kg)  Estimated body mass index is 26.45 kg/m as calculated from the following:   Height as of this encounter: 5\' 9"  (1.753 m).   Weight as of this encounter: 179 lb 1.6 oz (81.2 kg).   PERFORMANCE STATUS (ECOG) : 1 - Symptomatic but completely ambulatory   Physical Exam General: NAD, ambulatory with a cane  Pulmonary: normal breathing pattern  Cardio: RRR Neurological: AAOx 4, appears comfortable  IMPRESSION:  Mr. Faison is here for follow-up. He is appreciative of how well he is doing. Expresses he sometimes gets nervous at the thought of how well he is currently doing compared to months ago. His brother was in town visiting over the weekend and they spent time outside of the home. Travaughn is able to do more around the house, is working more, and enjoying his improved quality of life.   Denies weakness or fatigue. Denies nausea or vomiting.   Neoplasm related pain Pain is well controlled on current regimen.  MS Contin 60 mg, Dilaudid 6-8 mg, gabapentin 300 mg, and Cymbalta 30 mg. Unfortunately given his extensive disease this will be ongoing and realistic expectations are to gain somewhat better control with awareness pain will not completely resolve. If pain further escalates consideration for a more comfort hospice focus would be most appropriate in order to provide him most sufficient pain control. Will continue to closely monitor and support in any way that we can.   Constipation No concerns at this time. Will continue on  current bowel regimen of colace and miralax daily to prevent constipation in the setting of opioid use.   Decreased appetite/weight loss Mr. Ballantine reports appetite is improved. Some days better than others but he is able to tolerate meals and snacks. Denies any concerns at this time with nutrition. Current weight is up 179lbs from 169 lbs 2/21, 174lbs on 1/16.   Goals of care Milfred is aware that his cancer is He is remaining hopeful for some stability and extended time with his current treatment options.   PLAN: MS Contin ER 60 mg every 8 hours (new prescription for 30 day supply on 10/31/21 (Next refill due on 11/30/2021).  Dilaudid 6-8 mg every 3-4 hrs as needed for breakthrough pain. (Refill to be sent in on 11/07/21. (Next RX will be due on  11/22/21).  Gabapentin 300 mg three times daily (refilled by Dr. [name] on 2/16 with 2 refills provided) Tizanidine 2mg  QHS as needed for muscle spasms. (Refill sent on 11/04/21), Miralax and Colace daily for bowel regimen  I will plan to see him back in 3-4 weeks in collaboration with his other oncology appointments.  He knows to call with any needs prior to next visit.   Patient expressed understanding and was in agreement with this plan. He also understands that He can call the clinic at any time with any questions, concerns, or complaints.   Time Total: 35 min.   Visit consisted of counseling and education dealing with the complex and emotionally intense issues of symptom management and palliative care in the setting of serious and potentially life-threatening illness.Greater than 50%  of this time was spent counseling and coordinating care related to the above assessment and plan.  Alda Lea, AGPCNP-BC  Palliative Medicine Team/[facility] [place]

## 2021-11-04 NOTE — Telephone Encounter (Signed)
Call received from lab indicating there have a BB hold tube for pt and his Hgb = 7.9.  ? ?Reviewed by Dr. [name] who wants pt to receive 1 unit of PRBC's. ? ?Pt has infusion appt on Monday 11/09/21 and have requested additional time added to his tx. This has been confirmed by Delle Reining, Writer. ? ?Sample to BB has been added to pts labs and pt is aware of his results and plan for additional infusion on Monday. Pt expressed understanding of this information. ?

## 2021-11-06 ENCOUNTER — Other Ambulatory Visit: Payer: Self-pay | Admitting: Physician Assistant

## 2021-11-06 ENCOUNTER — Other Ambulatory Visit: Payer: Self-pay | Admitting: Medical Oncology

## 2021-11-06 ENCOUNTER — Other Ambulatory Visit (HOSPITAL_COMMUNITY): Payer: Self-pay

## 2021-11-06 DIAGNOSIS — D6481 Anemia due to antineoplastic chemotherapy: Secondary | ICD-10-CM

## 2021-11-06 DIAGNOSIS — D649 Anemia, unspecified: Secondary | ICD-10-CM

## 2021-11-07 ENCOUNTER — Other Ambulatory Visit (HOSPITAL_COMMUNITY): Payer: Self-pay

## 2021-11-09 ENCOUNTER — Inpatient Hospital Stay: Payer: Medicare Other

## 2021-11-09 ENCOUNTER — Other Ambulatory Visit: Payer: Self-pay

## 2021-11-09 ENCOUNTER — Inpatient Hospital Stay (HOSPITAL_BASED_OUTPATIENT_CLINIC_OR_DEPARTMENT_OTHER): Payer: Medicare Other | Admitting: Internal Medicine

## 2021-11-09 ENCOUNTER — Other Ambulatory Visit: Payer: Self-pay | Admitting: *Deleted

## 2021-11-09 VITALS — BP 139/80 | HR 91 | Temp 97.9°F | Resp 17 | Wt 173.4 lb

## 2021-11-09 DIAGNOSIS — D6481 Anemia due to antineoplastic chemotherapy: Secondary | ICD-10-CM

## 2021-11-09 DIAGNOSIS — C3491 Malignant neoplasm of unspecified part of right bronchus or lung: Secondary | ICD-10-CM

## 2021-11-09 DIAGNOSIS — C3490 Malignant neoplasm of unspecified part of unspecified bronchus or lung: Secondary | ICD-10-CM | POA: Diagnosis not present

## 2021-11-09 DIAGNOSIS — Z5112 Encounter for antineoplastic immunotherapy: Secondary | ICD-10-CM | POA: Diagnosis not present

## 2021-11-09 DIAGNOSIS — Z95828 Presence of other vascular implants and grafts: Secondary | ICD-10-CM

## 2021-11-09 DIAGNOSIS — Z5111 Encounter for antineoplastic chemotherapy: Secondary | ICD-10-CM | POA: Diagnosis not present

## 2021-11-09 DIAGNOSIS — R5382 Chronic fatigue, unspecified: Secondary | ICD-10-CM

## 2021-11-09 DIAGNOSIS — C7A1 Malignant poorly differentiated neuroendocrine tumors: Secondary | ICD-10-CM

## 2021-11-09 DIAGNOSIS — T451X5A Adverse effect of antineoplastic and immunosuppressive drugs, initial encounter: Secondary | ICD-10-CM

## 2021-11-09 LAB — CBC WITH DIFFERENTIAL (CANCER CENTER ONLY)
Abs Immature Granulocytes: 0.08 10*3/uL — ABNORMAL HIGH (ref 0.00–0.07)
Basophils Absolute: 0.1 10*3/uL (ref 0.0–0.1)
Basophils Relative: 1 %
Eosinophils Absolute: 0.2 10*3/uL (ref 0.0–0.5)
Eosinophils Relative: 4 %
HCT: 27.6 % — ABNORMAL LOW (ref 39.0–52.0)
Hemoglobin: 8.9 g/dL — ABNORMAL LOW (ref 13.0–17.0)
Immature Granulocytes: 1 %
Lymphocytes Relative: 20 %
Lymphs Abs: 1.2 10*3/uL (ref 0.7–4.0)
MCH: 31.4 pg (ref 26.0–34.0)
MCHC: 32.2 g/dL (ref 30.0–36.0)
MCV: 97.5 fL (ref 80.0–100.0)
Monocytes Absolute: 0.9 10*3/uL (ref 0.1–1.0)
Monocytes Relative: 16 %
Neutro Abs: 3.5 10*3/uL (ref 1.7–7.7)
Neutrophils Relative %: 58 %
Platelet Count: 337 10*3/uL (ref 150–400)
RBC: 2.83 MIL/uL — ABNORMAL LOW (ref 4.22–5.81)
RDW: 18.7 % — ABNORMAL HIGH (ref 11.5–15.5)
WBC Count: 6 10*3/uL (ref 4.0–10.5)
nRBC: 0 % (ref 0.0–0.2)

## 2021-11-09 LAB — CMP (CANCER CENTER ONLY)
ALT: 27 U/L (ref 0–44)
AST: 38 U/L (ref 15–41)
Albumin: 3.5 g/dL (ref 3.5–5.0)
Alkaline Phosphatase: 131 U/L — ABNORMAL HIGH (ref 38–126)
Anion gap: 6 (ref 5–15)
BUN: 25 mg/dL — ABNORMAL HIGH (ref 8–23)
CO2: 29 mmol/L (ref 22–32)
Calcium: 9.6 mg/dL (ref 8.9–10.3)
Chloride: 104 mmol/L (ref 98–111)
Creatinine: 1.19 mg/dL (ref 0.61–1.24)
GFR, Estimated: >60 mL/min (ref >60)
Glucose, Bld: 105 mg/dL — ABNORMAL HIGH (ref 70–99)
Potassium: 4.2 mmol/L (ref 3.5–5.1)
Sodium: 139 mmol/L (ref 135–145)
Total Bilirubin: 0.3 mg/dL (ref 0.3–1.2)
Total Protein: 6.9 g/dL (ref 6.5–8.1)

## 2021-11-09 LAB — SAMPLE TO BLOOD BANK

## 2021-11-09 MED ORDER — PALONOSETRON HCL INJECTION 0.25 MG/5ML
0.2500 mg | Freq: Once | INTRAVENOUS | Status: AC
  Administered 2021-11-09: 0.25 mg via INTRAVENOUS
  Filled 2021-11-09: qty 5

## 2021-11-09 MED ORDER — HEPARIN SOD (PORK) LOCK FLUSH 100 UNIT/ML IV SOLN
500.0000 [IU] | Freq: Once | INTRAVENOUS | Status: AC | PRN
  Administered 2021-11-09: 500 [IU]

## 2021-11-09 MED ORDER — SODIUM CHLORIDE 0.9 % IV SOLN
500.0000 mg/m2 | Freq: Once | INTRAVENOUS | Status: AC
  Administered 2021-11-09: 1000 mg via INTRAVENOUS
  Filled 2021-11-09: qty 40

## 2021-11-09 MED ORDER — SODIUM CHLORIDE 0.9% FLUSH
10.0000 mL | INTRAVENOUS | Status: DC | PRN
  Administered 2021-11-09: 10 mL

## 2021-11-09 MED ORDER — SODIUM CHLORIDE 0.9 % IV SOLN
150.0000 mg | Freq: Once | INTRAVENOUS | Status: DC
  Filled 2021-11-09: qty 5

## 2021-11-09 MED ORDER — CYANOCOBALAMIN 1000 MCG/ML IJ SOLN
1000.0000 ug | Freq: Once | INTRAMUSCULAR | Status: AC
  Administered 2021-11-09: 1000 ug via INTRAMUSCULAR
  Filled 2021-11-09: qty 1

## 2021-11-09 MED ORDER — SODIUM CHLORIDE 0.9 % IV SOLN: Freq: Once | INTRAVENOUS | Status: AC

## 2021-11-09 MED ORDER — SODIUM CHLORIDE 0.9% FLUSH
10.0000 mL | Freq: Once | INTRAVENOUS | Status: AC
  Administered 2021-11-09: 10 mL

## 2021-11-09 MED ORDER — LIDOCAINE-PRILOCAINE 2.5-2.5 % EX CREA: TOPICAL_CREAM | CUTANEOUS | 0 refills | Status: AC

## 2021-11-09 MED ORDER — SODIUM CHLORIDE 0.9 % IV SOLN
10.0000 mg | Freq: Once | INTRAVENOUS | Status: AC
  Administered 2021-11-09: 10 mg via INTRAVENOUS
  Filled 2021-11-09: qty 10

## 2021-11-09 MED ORDER — SODIUM CHLORIDE 0.9 % IV SOLN
200.0000 mg | Freq: Once | INTRAVENOUS | Status: AC
  Administered 2021-11-09: 200 mg via INTRAVENOUS
  Filled 2021-11-09: qty 200

## 2021-11-09 NOTE — Progress Notes (Signed)
[place] Telephone:([phone]) [phone]   Fax:(336) [phone]  OFFICE PROGRESS NOTE  [name], MD [place] [address]  DIAGNOSIS: Stage IV (pT1c, N1, M1b) large cell neuroendocrine carcinoma.  He presented with a right upper lobe lung nodule and hilar lymphadenopathy.  He was diagnosed in April 2022. He also had a small metastatic brain lesion.  The patient has local recurrence of his disease and the right chest wall diagnosed in November 2022. Molecular studies by foundation 1 showed no actionable mutations   PDL1: 0%   PRIOR THERAPY: 1) Robotic assisted thoracic surgery with a right upper lobectomy which was performed on 12/24/2020.  2) SRS to a small metastatic brain lesion completed on February 13, 2021 under the care of Dr. Lisbeth Renshaw.   3) Adjuvant chemotherapy with cisplatin 80 mg/m2 on day 1, etoposide 100 mg per metered squared on days 1, 2, and 3, IV every 3 weeks. First dose on 02/09/21.  Status post 4 cycles. 4) SRS to 4 new brain metastasis under the care of Dr. Lisbeth Renshaw, Last dose on 08/26/21 5) Palliative radiation to right chest wall from 09/01/21-09/23/21 under the care of Dr. Lisbeth Renshaw 6) SRS to the new brain lesion along the thalamus under the care of Dr. Lisbeth Renshaw. Completed on 10/20/21   CURRENT THERAPY:  1) palliative systemic chemotherapy with carboplatin for an AUC of 5, Alimta 500 mg per metered square, Keytruda 200 mg IV every 3 weeks.  First dose on 09/29/21.  Carboplatin has been removed from cycle #2 due to pancytopenia.  Status post 2 cycles 2) SRS to the new brain lesion along the thalamus under the care of Dr. Lisbeth Renshaw. Completed on 10/20/21  INTERVAL HISTORY: JOHNATHON OLDEN 66 y.o. male returns to the clinic today for follow-up visit.  The patient is feeling fine today with no concerning complaints except for mild fatigue and few episodes of diarrhea.  He indicated that his diarrhea is secondary to eating a lot of fruits recently.  His  right-sided chest pain has significantly improved in the last few weeks.  He still takes a lot of pain medication including MS Contin and Dilaudid prescribed by the palliative care team.  He denied having any shortness of breath except with exertion with no cough or hemoptysis.  He denied having any fever or chills.  He has no nausea, vomiting, abdominal pain or constipation.  He has no recent weight loss or night sweats.  He tolerated the last cycle of his treatment fairly well.  He is here for evaluation before starting cycle #3.  MEDICAL HISTORY: Past Medical History:  Diagnosis Date   Anemia    Anxiety    Arthritis    Basal cell carcinoma (BCC) of forehead    CAD (coronary artery disease)    a. 10/2015 ant STEMI >> LHC with 3 v CAD; oLAD tx with POBA >> emergent CABG. b. Multiple evals since that time, early graft failure of SVG-RCA by cath 03/2016. c. 2/19 PCI/DES x1 to pRCA, normal EF.   Carotid artery disease ([place])    a. 40-59% BICA 02/2018.   Depression    Dyspnea    Ectopic atrial tachycardia (HCC)    Esophageal reflux    eosinophil esophagitis   Family history of adverse reaction to anesthesia    "sister has PONV" (06/21/2017)   Former tobacco use    Gout    Hepatitis C    "treated and cured" (06/21/2017)  High cholesterol    History of blood transfusion    History of kidney stones    Hypertension    Ischemic cardiomyopathy    a. EF 25-30% at intraop TEE 4/17  //  b. Limited Echo 5/17 - EF 45-50%, mild ant HK. c. EF 55-65% by cath 09/2017.   Migraine    "3-4/yr" (06/21/2017)   Myocardial infarction ([place]) 10/2015   Palpitations    Pneumonia    Sinus bradycardia    a. HR dropping into 40s in 02/2016 -> BB reduced.   Stroke [facility] 10/2016   "small one; sometimes my memory/cognitive issues" (06/21/2017)   Symptomatic hypotension    a. 02/2016 ER visit -> meds reduced.   Syncope    Wears dentures    Wears glasses     ALLERGIES:  is allergic to prednisone, tetanus toxoids,  wellbutrin [bupropion], and varenicline.  MEDICATIONS:  Current Outpatient Medications  Medication Sig Dispense Refill   acetaminophen (TYLENOL) 500 MG tablet Take 1,000 mg by mouth every 6 (six) hours as needed for mild pain, fever or headache.     aspirin EC 81 MG tablet Take 81 mg by mouth daily. Swallow whole.     docusate sodium (COLACE) 100 MG capsule Take 100-200 mg by mouth daily.     DULoxetine (CYMBALTA) 30 MG capsule Take 1 capsule (30 mg total) by mouth 2 (two) times daily. 60 capsule 1   folic acid (FOLVITE) 1 MG tablet Take 1 tablet (1 mg total) by mouth daily. 30 tablet 4   gabapentin (NEURONTIN) 300 MG capsule Take 2 capsules (600 mg total) by mouth 3 (three) times daily. 180 capsule 2   HYDROmorphone (DILAUDID) 8 MG tablet Take 1 tablet (8 mg total) by mouth every 3 (three) hours as needed for severe pain. 120 tablet 0   lidocaine (LIDODERM) 5 % Place 1 patch onto the skin daily as needed (for pain- Remove & Discard patch within 12 hours or as directed by MD).     lidocaine-prilocaine (EMLA) cream Apply to the Port-A-Cath site 30-60-minute before treatment (Patient taking differently: Apply 1 application topically See admin instructions. Apply to the Port-A-Cath site 30-60-minutes before treatment) 30 g 0   magnesium oxide (MAG-OX) 400 (240 Mg) MG tablet Take 1 tablet (400 mg total) by mouth daily. 30 tablet 1   metoprolol succinate (TOPROL-XL) 25 MG 24 hr tablet Take 1 tablet (25 mg total) by mouth daily. (Patient taking differently: Take 25 mg by mouth every evening.) 90 tablet 3   midodrine (PROAMATINE) 2.5 MG tablet Take 2.5 mg by mouth 3 (three) times daily as needed (AS DIRECTED).     morphine (MS CONTIN) 60 MG 12 hr tablet Take 1 tablet (60 mg total) by mouth every 8 (eight) hours. 90 tablet 0   Multiple Vitamin (MULTIVITAMIN WITH MINERALS) TABS tablet Take 1 tablet by mouth daily.     ondansetron (ZOFRAN) 8 MG tablet Take 1 tablet (8 mg total) by mouth every 8 (eight)  hours as needed for nausea or vomiting. Starting 3 days after chemotherapy 90 tablet 2   pantoprazole (PROTONIX) 40 MG tablet Take 1 tablet by mouth daily at 6 AM. 30 tablet 1   polyethylene glycol (MIRALAX / GLYCOLAX) 17 g packet Take 17 g by mouth daily. (Patient not taking: Reported on 10/02/2021) 14 each 0   prochlorperazine (COMPAZINE) 10 MG tablet Take 1 tablet (10 mg total) by mouth every 6 (six) hours as needed for nausea or vomiting. 30 tablet  0   ranolazine (RANEXA) 1000 MG SR tablet Take 1 tablet (1,000 mg total) by mouth 2 (two) times daily. 180 tablet 3   senna-docusate (SENOKOT-S) 8.6-50 MG tablet Take 1 tablet by mouth 2 (two) times daily.     tiZANidine (ZANAFLEX) 2 MG tablet Take 1 tablet (2 mg total) by mouth at bedtime as needed for muscle spasms. 30 tablet 2   XARELTO 20 MG TABS tablet Take 20 mg by mouth every evening.     No current facility-administered medications for this visit.    SURGICAL HISTORY:  Past Surgical History:  Procedure Laterality Date   25 GAUGE PARS PLANA VITRECTOMY WITH 20 GAUGE MVR PORT  12/31/2020   Procedure: 25 GAUGE PARS PLANA VITRECTOMY WITH 20 GAUGE MVR PORT;  Surgeon: [name], MD;  Location: [facility];  Service: Thoracic;;   ANTERIOR CERVICAL DECOMP/DISCECTOMY FUSION N/A 10/17/2018   Procedure: Anterior Cervical Decompression Fusion - Cervical seven -Thoracic one;  Surgeon: Consuella Lose, MD;  Location: [facility];  Service: Neurosurgery;  Laterality: N/A;   BASAL CELL CARCINOMA EXCISION     "forehead   BIOPSY  07/20/2019   Procedure: BIOPSY;  Surgeon: Carol Ada, MD;  Location: [facility];  Service: Endoscopy;;   BIOPSY OF MEDIASTINAL MASS Right 07/29/2021   Procedure: RIGHT CHEST WALL MASS BIOPSY;  Surgeon: [name], MD;  Location: [facility];  Service: Thoracic;  Laterality: Right;   BRONCHIAL BIOPSY  12/24/2020   Procedure: BRONCHIAL BIOPSIES;  Surgeon: Garner Nash, DO;  Location: [place] [facility];  Service: Pulmonary;;    BRONCHIAL BRUSHINGS  12/24/2020   Procedure: BRONCHIAL BRUSHINGS;  Surgeon: Garner Nash, DO;  Location: [place];  Service: Pulmonary;;   BRONCHIAL NEEDLE ASPIRATION BIOPSY  12/24/2020   Procedure: BRONCHIAL NEEDLE ASPIRATION BIOPSIES;  Surgeon: Garner Nash, DO;  Location: [place];  Service: Pulmonary;;   CARDIAC CATHETERIZATION N/A 11/28/2015   Procedure: Left Heart Cath and Coronary Angiography;  Surgeon: [name], MD;  Location: [place] [facility];  Service: Cardiovascular;  Laterality: N/A;   CARDIAC CATHETERIZATION N/A 11/28/2015   Procedure: Coronary Balloon Angioplasty;  Surgeon: [name], MD;  Location: [place] CV [facility];  Service: Cardiovascular;  Laterality: N/A;  ostial LAD   CARDIAC CATHETERIZATION N/A 11/28/2015   Procedure: Coronary/Graft Angiography;  Surgeon: [name], MD;  Location: [place] CV LAB;  Service: Cardiovascular;  Laterality: N/A;  coronaries only    CARDIAC CATHETERIZATION N/A 04/21/2016   Procedure: Left Heart Cath and Coronary Angiography;  Surgeon: Wellington Hampshire, MD;  Location: [place] CV LAB;  Service: Cardiovascular;  Laterality: N/A;   CARDIAC CATHETERIZATION N/A 06/14/2016   Procedure: Left Heart Cath and Cors/Grafts Angiography;  Surgeon: Lorretta Harp, MD;  Location: [place] CV LAB;  Service: Cardiovascular;  Laterality: N/A;   CARDIAC CATHETERIZATION N/A 09/08/2016   Procedure: Left Heart Cath and Cors/Grafts Angiography;  Surgeon: Wellington Hampshire, MD;  Location: [place] [facility];  Service: Cardiovascular;  Laterality: N/A;   CARDIAC CATHETERIZATION     CORONARY ARTERY BYPASS GRAFT N/A 11/28/2015   Procedure: CORONARY ARTERY BYPASS GRAFTING (CABG) TIMES FIVE USING LEFT INTERNAL MAMMARY ARTERY AND RIGHT GREATER SAPHENOUS,VIEN HARVEATED BY ENDOVIEN, INTRAOPPRATIVE TEE;  Surgeon: Gaye Pollack, MD;  Location: [place];  Service: Open Heart Surgery;  Laterality: N/A;   CORONARY STENT INTERVENTION  N/A 10/05/2017   Procedure: CORONARY STENT INTERVENTION;  Surgeon: [name], MD;  [facility]  [facility];  Service: Cardiovascular;  Laterality: N/A;   ESOPHAGOGASTRODUODENOSCOPY (EGD) WITH PROPOFOL N/A 07/20/2019   Procedure: ESOPHAGOGASTRODUODENOSCOPY (EGD) WITH PROPOFOL;  Surgeon: Carol Ada, MD;  [facility];  Service: Endoscopy;  Laterality: N/A;   ESOPHAGOGASTRODUODENOSCOPY (EGD) WITH PROPOFOL N/A 01/30/2021   Procedure: ESOPHAGOGASTRODUODENOSCOPY (EGD) WITH PROPOFOL;  Surgeon: Carol Ada, MD;  [facility];  Service: Endoscopy;  Laterality: N/A;   FIDUCIAL MARKER PLACEMENT  12/24/2020   Procedure: FIDUCIAL DYE MARKER PLACEMENT;  Surgeon: Garner Nash, DO;  Location: [place] [facility];  Service: Pulmonary;;   HUMERUS SURGERY Right 1969   "tumor inside bone; filled it w/bone chips"   INTERCOSTAL NERVE BLOCK Right 12/24/2020   Procedure: INTERCOSTAL NERVE BLOCK;  Surgeon: [name], MD;  Location: [place];  Service: Thoracic;  Laterality: Right;   IR IMAGING GUIDED PORT INSERTION  02/06/2021   LEFT HEART CATH AND CORS/GRAFTS ANGIOGRAPHY N/A 03/11/2017   Procedure: Left Heart Cath and Cors/Grafts Angiography;  Surgeon: Leonie Man, MD;  Location: [place] CV LAB;  Service: Cardiovascular;  Laterality: N/A;   LEFT HEART CATH AND CORS/GRAFTS ANGIOGRAPHY N/A 10/05/2017   Procedure: LEFT HEART CATH AND CORS/GRAFTS ANGIOGRAPHY;  Surgeon: [name], MD;  Location: [place] CV LAB;  Service: Cardiovascular;  Laterality: N/A;   LEFT HEART CATH AND CORS/GRAFTS ANGIOGRAPHY N/A 04/11/2019   Procedure: LEFT HEART CATH AND CORS/GRAFTS ANGIOGRAPHY;  Surgeon: [name], MD;  Location: [place] CV LAB;  Service: Cardiovascular;  Laterality: N/A;   NODE DISSECTION Right 12/24/2020   Procedure: NODE DISSECTION;  Surgeon: [name], MD;  Location: [place];  Service: Thoracic;  Laterality: Right;   PERIPHERAL VASCULAR  CATHETERIZATION N/A 06/14/2016   Procedure: Lower Extremity Angiography;  Surgeon: Lorretta Harp, MD;  Location: [place] CV LAB;  Service: Cardiovascular;  Laterality: N/A;   VIDEO BRONCHOSCOPY WITH ENDOBRONCHIAL NAVIGATION Right 12/24/2020   Procedure: VIDEO BRONCHOSCOPY WITH ENDOBRONCHIAL NAVIGATION;  Surgeon: Garner Nash, DO;  Location: [place];  Service: Pulmonary;  Laterality: Right;   VIDEO BRONCHOSCOPY WITH ENDOBRONCHIAL ULTRASOUND N/A 12/24/2020   Procedure: VIDEO BRONCHOSCOPY WITH ENDOBRONCHIAL ULTRASOUND;  Surgeon: Garner Nash, DO;  Location: [place];  Service: Pulmonary;  Laterality: N/A;   VIDEO BRONCHOSCOPY WITH INSERTION OF INTERBRONCHIAL VALVE (IBV) N/A 12/31/2020   Procedure: VIDEO BRONCHOSCOPY WITH INSERTION OF INTERBRONCHIAL VALVE (IBV).VALVE IN CARTRIDGE 31m,9mm. CHEST TUBE PLACEMENT.;  Surgeon: LLajuana Matte MD;  [facility];  Service: Thoracic;  Laterality: N/A;   VIDEO BRONCHOSCOPY WITH INSERTION OF INTERBRONCHIAL VALVE (IBV) N/A 07/29/2021   Procedure: VIDEO BRONCHOSCOPY WITH REMOVAL OF INTERBRONCHIAL VALVE (IBV);  Surgeon: LLajuana Matte MD;  [facility];  Service: Thoracic;  Laterality: N/A;    REVIEW OF SYSTEMS:  A comprehensive review of systems was negative except for: Constitutional: positive for fatigue Respiratory: positive for dyspnea on exertion Gastrointestinal: positive for diarrhea   PHYSICAL EXAMINATION: General appearance: alert, cooperative, fatigued, and no distress Head: Normocephalic, without obvious abnormality, atraumatic Neck: no adenopathy, no JVD, supple, symmetrical, trachea midline, and thyroid not enlarged, symmetric, no tenderness/mass/nodules Lymph nodes: Cervical, supraclavicular, and axillary nodes normal. Resp: clear to auscultation bilaterally Back: symmetric, no curvature. ROM normal. No CVA tenderness. Cardio: regular rate and rhythm, S1, S2 normal, no murmur, click, rub or gallop GI: soft,  non-tender; bowel sounds normal; no masses,  no organomegaly Extremities: extremities normal, atraumatic, no cyanosis or edema  ECOG PERFORMANCE STATUS: 1 - Symptomatic but completely ambulatory  Blood pressure  139/80, pulse 91, temperature 97.9 F (36.6 C), temperature source Tympanic, resp. rate 17, weight 173 lb 6.4 oz (78.7 kg), SpO2 97 %.  LABORATORY DATA: Lab Results  Component Value Date   WBC 6.7 11/04/2021   HGB 7.9 (L) 11/04/2021   HCT 25.5 (L) 11/04/2021   MCV 98.5 11/04/2021   PLT 273 11/04/2021      Chemistry      Component Value Date/Time   NA 138 11/04/2021 1255   K 5.2 (H) 11/04/2021 1255   CL 105 11/04/2021 1255   CO2 29 11/04/2021 1255   BUN 29 (H) 11/04/2021 1255   CREATININE 1.17 11/04/2021 1255   CREATININE 1.00 02/12/2016 1038      Component Value Date/Time   CALCIUM 9.3 11/04/2021 1255   ALKPHOS 122 11/04/2021 1255   AST 26 11/04/2021 1255   ALT 17 11/04/2021 1255   BILITOT 0.3 11/04/2021 1255       RADIOGRAPHIC STUDIES: MR Brain W Wo Contrast  Result Date: 10/16/2021 CLINICAL DATA:  Brain metastases.  Assess treatment response. EXAM: MRI HEAD WITHOUT AND WITH CONTRAST TECHNIQUE: Multiplanar, multiecho pulse sequences of the brain and surrounding structures were obtained without and with intravenous contrast. CONTRAST:  35m MULTIHANCE GADOBENATE DIMEGLUMINE 529 MG/ML IV SOLN COMPARISON:  10/08/2021 FINDINGS: Brain: Diffusion imaging does not show any acute or subacute infarction or any cause restricted diffusion. No hydrocephalus or extra-axial collection. No acute intracranial hemorrhage. Slight enlargement of a ring-enhancing lesion in the left cerebellum, measuring 5 mm today compared with 3.5 mm 8 days ago. Small focus of hemosiderin deposition in the right cerebellum is present without abnormal enhancement. No second enhancing cerebellar lesion. Stable punctate focus of enhancement at the deep insula on the right axial image 75. Slight  enlargement of an enhancing lesion of the left basal ganglia/genu of the internal capsule, measuring 7.6 mm today compared with 5.7 mm previously. Stable punctate focus of enhancement along a left posterior parietal gyrus, axial image 122. Stable 3 mm focus of enhancement along a right posterior frontal gyrus axial image 137. Stable 2.5 mm focus of enhancement along a left posterior frontal gyrus, axial image 139. No entirely new lesion is identified. Vascular: Major vessels at the base of the brain show flow. Skull and upper cervical spine: Negative Sinuses/Orbits: Clear/normal Other: None IMPRESSION: Slight enlargement of the ring-enhancing lesion in the left cerebellum, measuring 5 mm today compared with 3.5 mm 8 days ago. Slight enlargement of an enhancing lesion of the left basal ganglia/genu of the internal capsule, measuring 7.6 mm today compared with 5.7 mm previously. Four other stable punctate lesions, right deep insula axial image 75, left posterior parietal gyrus axial image 122, right posterior frontal gyrus axial image 137, left posterior frontal gyrus axial image 139. No entirely new lesion. Electronically Signed   By: MNelson ChimesM.D.   On: 10/16/2021 15:41   MR TOTAL SPINE METS SCREENING  Result Date: 10/10/2021 CLINICAL DATA:  Screening exam for spinal metastatic disease. EXAM: MRI TOTAL SPINE WITHOUT AND WITH CONTRAST TECHNIQUE: Multisequence MR imaging of the spine from the cervical spine to the sacrum was performed prior to and following IV contrast administration for evaluation of spinal metastatic disease. CONTRAST:  8 cc of Gadavist intravenous COMPARISON:  10/02/2021 CT of the chest and abdomen. Thoracic and lumbar spine MRI 02/18/2021. FINDINGS: MRI CERVICAL SPINE FINDINGS Alignment: Mild degenerative anterolisthesis at C3-4. Vertebrae: C7-T1 ACDF with superior endplate depression at C7. Probable solid arthrodesis at this level. Signal in  the C7 vertebra on postcontrast imaging is  likely from metallic susceptibility artifact and failure of fat suppression. No evidence of metastatic disease. Cord: Normal cord signal.  No abnormal intrathecal enhancement. Posterior Fossa, vertebral arteries, paraspinal tissues: Unremarkable Disc levels: Multilevel facet spurring. C7-T1 ACDF. Mild disc bulging partially effacing ventral subarachnoid spaces at multiple levels. No degenerative cord compression. MRI THORACIC SPINE FINDINGS Alignment:  Unremarkable Vertebrae: Diffusely preserved fatty marrow. No evidence of bony metastasis, fracture, or inflammation. Known posterior right eleventh rib lesion with inter spinous tumor infiltration by CT. No continuation into the adjacent foramen or spinal canal. Cord:  Normal signal and morphology. Paraspinal and other soft tissues: Known right paravertebral tumor as evaluated by CT. Disc levels: Generalized spondylosis. MRI LUMBAR SPINE FINDINGS Segmentation:  5 lumbar type vertebrae Alignment:  Physiologic Vertebrae: No evidence of metastasis. L1 hemangioma as previously demonstrated. Conus medullaris: Extends to the L1-2 level and appears normal. No abnormal intrathecal enhancement Paraspinal and other soft tissues: No visible mass or inflammation. Disc levels: Ordinary degenerative changes as previously demonstrated. Generalized disc space narrowing and bulging. IMPRESSION: Known posterior right eleventh rib lesion with soft tissue extension. No adjacent foraminal or spinal canal infiltration. Negative for metastatic disease throughout the spine. Electronically Signed   By: Jorje Guild M.D.   On: 10/10/2021 10:41   VAS [name] LOWER EXTREMITY VENOUS (DVT)  Result Date: 10/30/2021  Lower Venous DVT Study Patient Name:  GAETANO ROMBERGER  Date of Exam:   10/30/2021 Medical Rec #: [id number]         Accession #:    [id number] Date of Birth: May 19, 1956          Patient Gender: M Patient Age:   75 years Exam Location:  [facility] Procedure:      VAS [name] LOWER  EXTREMITY VENOUS (DVT) Referring Phys: Roosevelt Locks --------------------------------------------------------------------------------  Indications: Pain.  Risk Factors: Cancer. Anticoagulation: Xarelto. Comparison Study: No prior studies. Performing Technologist: Oliver Hum RVT  Examination Guidelines: A complete evaluation includes B-mode imaging, spectral Doppler, color Doppler, and power Doppler as needed of all accessible portions of each vessel. Bilateral testing is considered an integral part of a complete examination. Limited examinations for reoccurring indications may be performed as noted. The reflux portion of the exam is performed with the patient in reverse Trendelenburg.  +-----+---------------+---------+-----------+----------+--------------+  RIGHT Compressibility Phasicity Spontaneity Properties Thrombus Aging  +-----+---------------+---------+-----------+----------+--------------+  CFV   Full            Yes       Yes                                    +-----+---------------+---------+-----------+----------+--------------+   +---------+---------------+---------+-----------+----------+--------------+  LEFT      Compressibility Phasicity Spontaneity Properties Thrombus Aging  +---------+---------------+---------+-----------+----------+--------------+  CFV       Full            Yes       Yes                                    +---------+---------------+---------+-----------+----------+--------------+  SFJ       Full                                                             +---------+---------------+---------+-----------+----------+--------------+  FV Prox   Full                                                             +---------+---------------+---------+-----------+----------+--------------+  FV Mid    Full                                                             +---------+---------------+---------+-----------+----------+--------------+  FV Distal Full                                                              +---------+---------------+---------+-----------+----------+--------------+  PFV       Full                                                             +---------+---------------+---------+-----------+----------+--------------+  POP       Full            Yes       Yes                                    +---------+---------------+---------+-----------+----------+--------------+  PTV       Full                                                             +---------+---------------+---------+-----------+----------+--------------+  PERO      Full                                                             +---------+---------------+---------+-----------+----------+--------------+    Summary: RIGHT: - No evidence of common femoral vein obstruction.  LEFT: - There is no evidence of deep vein thrombosis in the lower extremity.  - No cystic structure found in the popliteal fossa.  *See table(s) above for measurements and observations. Electronically signed by Orlie Pollen on 10/30/2021 at 7:22:43 PM.    Final     ASSESSMENT AND PLAN: This is a very pleasant 66 years old white male recently diagnosed with a stage IV (T1c, N1, M1 B) large cell neuroendocrine carcinoma presented with right upper lobe lung nodule in addition to right hilar adenopathy in April 2022 and the patient was found to have solitary brain metastasis in May 2022 He is status post right upper lobectomy  with lymph node sampling under the care of Dr. Kipp Brood. The patient underwent systemic chemotherapy with cisplatin 80 mg/M2 on day 1 and etoposide 100 Mg/M2 on days 1, 2 and 3 every 3 weeks.  He is status post 4 cycles.  He has a rough time tolerating his adjuvant systemic chemotherapy and it was discontinued after cycle #4. He is currently on observation but continues to have pain in the right side of the chest.  He was seen by Dr. Kipp Brood and underwent resection of the chest wall nodule and the final pathology was  consistent with local recurrence of his disease and the right chest wall at the site of one of the chest wall tube. The patient was also found on recent MRI of the brain to have 4 new small brain metastasis that were treated with SRS. He completed palliative radiotherapy to the local disease recurrence in the right side of the chest wall likely from implants after his surgical resection. He is currently undergoing systemic chemotherapy with carboplatin for AUC of 5, Alimta 500 Mg/M2 and Keytruda 200 Mg IV every 3 weeks status post 2 cycles.  Starting cycle #2 carboplatin was discontinued secondary to significant pancytopenia.  The patient tolerated the second cycle of his treatment with Alimta and Keytruda fairly well.  I recommended for him to proceed with cycle #3 today as planned. I will see him back for follow-up visit in 3 weeks for evaluation with repeat CT scan of the chest, abdomen and pelvis for restaging of his disease. For the pain management, the patient will continue his current treatment with MS Contin, MS IR and gabapentin.  He is followed by the palliative care clinic. The patient was advised to call immediately if he has any other concerning symptoms in the interval. The patient voices understanding of current disease status and treatment options and is in agreement with the current care plan.  All questions were answered. The patient knows to call the clinic with any problems, questions or concerns. We can certainly see the patient much sooner if necessary.  The total time spent in the appointment was 20 minutes.  Disclaimer: This note was dictated with voice recognition software. Similar sounding words can inadvertently be transcribed and may not be corrected upon review.

## 2021-11-09 NOTE — Progress Notes (Signed)
Nutrition Assessment: ? ?Patient identified via Malnutrition Screening report ? 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  Past medical history of hep C, HTN, GERd, STEMI, s/p CABG. Patient receiving almita and [place].  ? ?Met with patient during infusion.  Patient reports that his appetite is good.  Eating more fruits and vegetables.  Loves to eat at [place].  Denies nutrition impact symptoms.   ? ? ? ?Medications: reviewed ? ?Labs: reviewed ? ?Anthropometrics:  ? ?Height: 69 inches ?Weight: 173 lb 6.4 oz today ?179 lb on 3/8 ?169 lb on 2/21 ?Typical weight 170-173 lb for few months ?210 lb about 1 year ago ?BMI: 25 ? ?18% weight loss in the last year ? ?NUTRITION DIAGNOSIS: Unintentional weight loss related to cancer and cancer related treatments as evidenced by 18% weight loss in the last year, appetite improved ? ? ?INTERVENTION:  ?Encouraged patient to continue focusing on good plant foods and lean animal protein ?Encouraged protein foods at each meal ?Encouraged weight maintenance ?Contact information provided ?  ? ?MONITORING, EVALUATION, GOAL: weight trends intake ? ? ?NEXT VISIT: to be determined with treatment ? ?[name], RD, LDN ?Registered Dietitian ?[phone] (mobile) ? ? ?

## 2021-11-09 NOTE — Patient Instructions (Signed)
[facility]  Discharge Instructions: Thank you for choosing [facility] to provide your oncology and hematology care.   If you have a lab appointment with the [facility], please go directly to the [facility] and check in at the registration area.   Wear comfortable clothing and clothing appropriate for easy access to any Portacath or PICC line.   We strive to give you quality time with your provider. You may need to reschedule your appointment if you arrive late (15 or more minutes).  Arriving late affects you and other patients whose appointments are after yours.  Also, if you miss three or more appointments without notifying the office, you may be dismissed from the clinic at the providers discretion.      For prescription refill requests, have your pharmacy contact our office and allow 72 hours for refills to be completed.    Today you received the following chemotherapy and/or immunotherapy agents: Pembrolizumab (Keytruda),  Pemetrexed (Alimta).   To help prevent nausea and vomiting after your treatment, we encourage you to take your nausea medication as directed.  BELOW ARE SYMPTOMS THAT SHOULD BE REPORTED IMMEDIATELY: *FEVER GREATER THAN 100.4 F (38 C) OR HIGHER *CHILLS OR SWEATING *NAUSEA AND VOMITING THAT IS NOT CONTROLLED WITH YOUR NAUSEA MEDICATION *UNUSUAL SHORTNESS OF BREATH *UNUSUAL BRUISING OR BLEEDING *URINARY PROBLEMS (pain or burning when urinating, or frequent urination) *BOWEL PROBLEMS (unusual diarrhea, constipation, pain near the anus) TENDERNESS IN MOUTH AND THROAT WITH OR WITHOUT PRESENCE OF ULCERS (sore throat, sores in mouth, or a toothache) UNUSUAL RASH, SWELLING OR PAIN  UNUSUAL VAGINAL DISCHARGE OR ITCHING   Items with * indicate a potential emergency and should be followed up as soon as possible or go to the Emergency Department if any problems should occur.  Please show the CHEMOTHERAPY ALERT CARD or  IMMUNOTHERAPY ALERT CARD at check-in to the Emergency Department and triage nurse.  Should you have questions after your visit or need to cancel or reschedule your appointment, please contact [facility]  Dept: [phone]  and follow the prompts.  Office hours are 8:00 a.m. to 4:30 p.m. Monday - Friday. Please note that voicemails left after 4:00 p.m. may not be returned until the following business day.  We are closed weekends and major holidays. You have access to a nurse at all times for urgent questions. Please call the main number to the clinic Dept: [phone] and follow the prompts.   For any non-urgent questions, you may also contact your provider using MyChart. We now offer e-Visits for anyone 31 and older to request care online for non-urgent symptoms. For details visit [email or web address].   Also download the MyChart app! Go to the app store, search "MyChart", open the app, select [facility], and log in with your MyChart username and password.  Due to Covid, a mask is required upon entering the hospital/clinic. If you do not have a mask, one will be given to you upon arrival. For doctor visits, patients may have 1 support person aged 32 or older with them. For treatment visits, patients cannot have anyone with them due to current Covid guidelines and our immunocompromised population.   Pembrolizumab injection What is this medication? PEMBROLIZUMAB (pem broe liz ue mab) is a monoclonal antibody. It is used to treat certain types of cancer. This medicine may be used for other purposes; ask your health care provider or pharmacist if you have questions. COMMON BRAND NAME(S): [employer]  What should I tell my care team before I take this medication? They need to know if you have any of these conditions: autoimmune diseases like Crohn's disease, ulcerative colitis, or lupus have had or planning to have an allogeneic stem cell transplant (uses someone else's  stem cells) history of organ transplant history of chest radiation nervous system problems like myasthenia gravis or Guillain-Barre syndrome an unusual or allergic reaction to pembrolizumab, other medicines, foods, dyes, or preservatives pregnant or trying to get pregnant breast-feeding How should I use this medication? This medicine is for infusion into a vein. It is given by a health care professional in a hospital or clinic setting. A special MedGuide will be given to you before each treatment. Be sure to read this information carefully each time. Talk to your pediatrician regarding the use of this medicine in children. While this drug may be prescribed for children as young as 6 months for selected conditions, precautions do apply. Overdosage: If you think you have taken too much of this medicine contact a poison control center or emergency room at once. NOTE: This medicine is only for you. Do not share this medicine with others. What if I miss a dose? It is important not to miss your dose. Call your doctor or health care professional if you are unable to keep an appointment. What may interact with this medication? Interactions have not been studied. This list may not describe all possible interactions. Give your health care provider a list of all the medicines, herbs, non-prescription drugs, or dietary supplements you use. Also tell them if you smoke, drink alcohol, or use illegal drugs. Some items may interact with your medicine. What should I watch for while using this medication? Your condition will be monitored carefully while you are receiving this medicine. You may need blood work done while you are taking this medicine. Do not become pregnant while taking this medicine or for 4 months after stopping it. Women should inform their doctor if they wish to become pregnant or think they might be pregnant. There is a potential for serious side effects to an unborn child. Talk to your health  care professional or pharmacist for more information. Do not breast-feed an infant while taking this medicine or for 4 months after the last dose. What side effects may I notice from receiving this medication? Side effects that you should report to your doctor or health care professional as soon as possible: allergic reactions like skin rash, itching or hives, swelling of the face, lips, or tongue bloody or black, tarry breathing problems changes in vision chest pain chills confusion constipation cough diarrhea dizziness or feeling faint or lightheaded fast or irregular heartbeat fever flushing joint pain low blood counts - this medicine may decrease the number of white blood cells, red blood cells and platelets. You may be at increased risk for infections and bleeding. muscle pain muscle weakness pain, tingling, numbness in the hands or feet persistent headache redness, blistering, peeling or loosening of the skin, including inside the mouth signs and symptoms of high blood sugar such as dizziness; dry mouth; dry skin; fruity breath; nausea; stomach pain; increased hunger or thirst; increased urination signs and symptoms of kidney injury like trouble passing urine or change in the amount of urine signs and symptoms of liver injury like dark urine, light-colored stools, loss of appetite, nausea, right upper belly pain, yellowing of the eyes or skin sweating swollen lymph nodes weight loss Side effects that usually do not require  medical attention (report to your doctor or health care professional if they continue or are bothersome): decreased appetite hair loss tiredness This list may not describe all possible side effects. Call your doctor for medical advice about side effects. You may report side effects to FDA at 1-800-FDA-1088. Where should I keep my medication? This drug is given in a hospital or clinic and will not be stored at home. NOTE: This sheet is a summary. It may  not cover all possible information. If you have questions about this medicine, talk to your doctor, pharmacist, or health care provider.  2022 Elsevier/Gold Standard (2021-05-05 00:00:00)  Pemetrexed injection What is this medication? PEMETREXED (PEM e TREX ed) is a chemotherapy drug used to treat lung cancers like non-small cell lung cancer and mesothelioma. It may also be used to treat other cancers. This medicine may be used for other purposes; ask your health care provider or pharmacist if you have questions. COMMON BRAND NAME(S): Alimta, PEMFEXY What should I tell my care team before I take this medication? They need to know if you have any of these conditions: infection (especially a virus infection such as chickenpox, cold sores, or herpes) kidney disease low blood counts, like low white cell, platelet, or red cell counts lung or breathing disease, like asthma radiation therapy an unusual or allergic reaction to pemetrexed, other medicines, foods, dyes, or preservative pregnant or trying to get pregnant breast-feeding How should I use this medication? This drug is given as an infusion into a vein. It is administered in a hospital or clinic by a specially trained health care professional. Talk to your pediatrician regarding the use of this medicine in children. Special care may be needed. Overdosage: If you think you have taken too much of this medicine contact a poison control center or emergency room at once. NOTE: This medicine is only for you. Do not share this medicine with others. What if I miss a dose? It is important not to miss your dose. Call your doctor or health care professional if you are unable to keep an appointment. What may interact with this medication? This medicine may interact with the following medications: Ibuprofen This list may not describe all possible interactions. Give your health care provider a list of all the medicines, herbs, non-prescription drugs,  or dietary supplements you use. Also tell them if you smoke, drink alcohol, or use illegal drugs. Some items may interact with your medicine. What should I watch for while using this medication? Visit your doctor for checks on your progress. This drug may make you feel generally unwell. This is not uncommon, as chemotherapy can affect healthy cells as well as cancer cells. Report any side effects. Continue your course of treatment even though you feel ill unless your doctor tells you to stop. In some cases, you may be given additional medicines to help with side effects. Follow all directions for their use. Call your doctor or health care professional for advice if you get a fever, chills or sore throat, or other symptoms of a cold or flu. Do not treat yourself. This drug decreases your body's ability to fight infections. Try to avoid being around people who are sick. This medicine may increase your risk to bruise or bleed. Call your doctor or health care professional if you notice any unusual bleeding. Be careful brushing and flossing your teeth or using a toothpick because you may get an infection or bleed more easily. If you have any dental work done, tell  your dentist you are receiving this medicine. Avoid taking products that contain aspirin, acetaminophen, ibuprofen, naproxen, or ketoprofen unless instructed by your doctor. These medicines may hide a fever. Call your doctor or health care professional if you get diarrhea or mouth sores. Do not treat yourself. To protect your kidneys, drink water or other fluids as directed while you are taking this medicine. Do not become pregnant while taking this medicine or for 6 months after stopping it. Women should inform their doctor if they wish to become pregnant or think they might be pregnant. Men should not father a child while taking this medicine and for 3 months after stopping it. This may interfere with the ability to father a child. You should talk  to your doctor or health care professional if you are concerned about your fertility. There is a potential for serious side effects to an unborn child. Talk to your health care professional or pharmacist for more information. Do not breast-feed an infant while taking this medicine or for 1 week after stopping it. What side effects may I notice from receiving this medication? Side effects that you should report to your doctor or health care professional as soon as possible: allergic reactions like skin rash, itching or hives, swelling of the face, lips, or tongue breathing problems redness, blistering, peeling or loosening of the skin, including inside the mouth signs and symptoms of bleeding such as bloody or black, tarry stools; red or dark-brown urine; spitting up blood or brown material that looks like coffee grounds; red spots on the skin; unusual bruising or bleeding from the eye, gums, or nose signs and symptoms of infection like fever or chills; cough; sore throat; pain or trouble passing urine signs and symptoms of kidney injury like trouble passing urine or change in the amount of urine signs and symptoms of liver injury like dark yellow or brown urine; general ill feeling or flu-like symptoms; light-colored stools; loss of appetite; nausea; right upper belly pain; unusually weak or tired; yellowing of the eyes or skin Side effects that usually do not require medical attention (report to your doctor or health care professional if they continue or are bothersome): constipation mouth sores nausea, vomiting unusually weak or tired This list may not describe all possible side effects. Call your doctor for medical advice about side effects. You may report side effects to FDA at 1-800-FDA-1088. Where should I keep my medication? This drug is given in a hospital or clinic and will not be stored at home. NOTE: This sheet is a summary. It may not cover all possible information. If you have  questions about this medicine, talk to your doctor, pharmacist, or health care provider.  2022 Elsevier/Gold Standard (2017-10-11 00:00:00)

## 2021-11-10 ENCOUNTER — Other Ambulatory Visit (HOSPITAL_COMMUNITY): Payer: Self-pay

## 2021-11-11 ENCOUNTER — Telehealth: Payer: Self-pay

## 2021-11-11 NOTE — Telephone Encounter (Signed)
Pt left a message stating he is having diarrhea. ? ?I have called the pt back. He states the diarrhea is 5-6 a day and denies any other sx. ? ?Discussed with Dr. [name] who advised for pt to take Imodium and if this does not help and his Bms remain 6-10 times a day, call us back. ? ?Pt has been advised of this and expressed understanding of this information. ?

## 2021-11-12 ENCOUNTER — Encounter: Payer: Self-pay | Admitting: Interventional Cardiology

## 2021-11-12 ENCOUNTER — Ambulatory Visit: Payer: Medicare Other | Admitting: Interventional Cardiology

## 2021-11-16 ENCOUNTER — Other Ambulatory Visit: Payer: Self-pay

## 2021-11-16 MED ORDER — HEPARIN SOD (PORK) LOCK FLUSH 100 UNIT/ML IV SOLN
500.0000 [IU] | Freq: Once | INTRAVENOUS | Status: DC
Start: 2021-12-04 — End: 2021-11-16

## 2021-11-16 MED ORDER — SODIUM CHLORIDE 0.9% FLUSH: 10.0000 mL | Freq: Once | INTRAVENOUS | Status: DC

## 2021-11-18 ENCOUNTER — Other Ambulatory Visit: Payer: Self-pay | Admitting: Medical Oncology

## 2021-11-18 ENCOUNTER — Inpatient Hospital Stay: Payer: Medicare Other

## 2021-11-18 ENCOUNTER — Telehealth: Payer: Self-pay | Admitting: Internal Medicine

## 2021-11-18 ENCOUNTER — Encounter: Payer: Self-pay | Admitting: Nurse Practitioner

## 2021-11-18 ENCOUNTER — Other Ambulatory Visit: Payer: Self-pay

## 2021-11-18 ENCOUNTER — Inpatient Hospital Stay (HOSPITAL_BASED_OUTPATIENT_CLINIC_OR_DEPARTMENT_OTHER): Payer: Medicare Other | Admitting: Nurse Practitioner

## 2021-11-18 VITALS — BP 142/70 | HR 85 | Temp 98.7°F | Resp 18 | Ht 69.0 in | Wt 178.1 lb

## 2021-11-18 DIAGNOSIS — T451X5A Adverse effect of antineoplastic and immunosuppressive drugs, initial encounter: Secondary | ICD-10-CM

## 2021-11-18 DIAGNOSIS — K5903 Drug induced constipation: Secondary | ICD-10-CM

## 2021-11-18 DIAGNOSIS — G893 Neoplasm related pain (acute) (chronic): Secondary | ICD-10-CM

## 2021-11-18 DIAGNOSIS — C7A1 Malignant poorly differentiated neuroendocrine tumors: Secondary | ICD-10-CM

## 2021-11-18 DIAGNOSIS — Z515 Encounter for palliative care: Secondary | ICD-10-CM | POA: Diagnosis not present

## 2021-11-18 DIAGNOSIS — Z95828 Presence of other vascular implants and grafts: Secondary | ICD-10-CM

## 2021-11-18 DIAGNOSIS — C3491 Malignant neoplasm of unspecified part of right bronchus or lung: Secondary | ICD-10-CM

## 2021-11-18 DIAGNOSIS — R530 Neoplastic (malignant) related fatigue: Secondary | ICD-10-CM | POA: Diagnosis not present

## 2021-11-18 DIAGNOSIS — Z5112 Encounter for antineoplastic immunotherapy: Secondary | ICD-10-CM | POA: Diagnosis not present

## 2021-11-18 DIAGNOSIS — R5382 Chronic fatigue, unspecified: Secondary | ICD-10-CM

## 2021-11-18 LAB — CMP (CANCER CENTER ONLY)
ALT: 36 U/L (ref 0–44)
AST: 37 U/L (ref 15–41)
Albumin: 3.5 g/dL (ref 3.5–5.0)
Alkaline Phosphatase: 134 U/L — ABNORMAL HIGH (ref 38–126)
Anion gap: 7 (ref 5–15)
BUN: 21 mg/dL (ref 8–23)
CO2: 25 mmol/L (ref 22–32)
Calcium: 9.6 mg/dL (ref 8.9–10.3)
Chloride: 104 mmol/L (ref 98–111)
Creatinine: 0.95 mg/dL (ref 0.61–1.24)
GFR, Estimated: >60 mL/min (ref >60)
Glucose, Bld: 132 mg/dL — ABNORMAL HIGH (ref 70–99)
Potassium: 4.4 mmol/L (ref 3.5–5.1)
Sodium: 136 mmol/L (ref 135–145)
Total Bilirubin: 0.4 mg/dL (ref 0.3–1.2)
Total Protein: 6.7 g/dL (ref 6.5–8.1)

## 2021-11-18 LAB — CBC WITH DIFFERENTIAL (CANCER CENTER ONLY)
Abs Immature Granulocytes: 0.22 10*3/uL — ABNORMAL HIGH (ref 0.00–0.07)
Basophils Absolute: 0 10*3/uL (ref 0.0–0.1)
Basophils Relative: 0 %
Eosinophils Absolute: 0 10*3/uL (ref 0.0–0.5)
Eosinophils Relative: 0 %
HCT: 22.1 % — ABNORMAL LOW (ref 39.0–52.0)
Hemoglobin: 7.3 g/dL — ABNORMAL LOW (ref 13.0–17.0)
Immature Granulocytes: 7 %
Lymphocytes Relative: 7 %
Lymphs Abs: 0.3 10*3/uL — ABNORMAL LOW (ref 0.7–4.0)
MCH: 31.9 pg (ref 26.0–34.0)
MCHC: 33 g/dL (ref 30.0–36.0)
MCV: 96.5 fL (ref 80.0–100.0)
Monocytes Absolute: 0.4 10*3/uL (ref 0.1–1.0)
Monocytes Relative: 11 %
Neutro Abs: 2.5 10*3/uL (ref 1.7–7.7)
Neutrophils Relative %: 75 %
Platelet Count: 148 10*3/uL — ABNORMAL LOW (ref 150–400)
RBC: 2.29 MIL/uL — ABNORMAL LOW (ref 4.22–5.81)
RDW: 17.5 % — ABNORMAL HIGH (ref 11.5–15.5)
WBC Count: 3.4 10*3/uL — ABNORMAL LOW (ref 4.0–10.5)
nRBC: 0 % (ref 0.0–0.2)

## 2021-11-18 LAB — SAMPLE TO BLOOD BANK

## 2021-11-18 LAB — PREPARE RBC (CROSSMATCH)

## 2021-11-18 LAB — TSH: TSH: 0.462 u[IU]/mL (ref 0.320–4.118)

## 2021-11-18 MED ORDER — SODIUM CHLORIDE 0.9% FLUSH
10.0000 mL | Freq: Once | INTRAVENOUS | Status: AC
  Administered 2021-11-18: 10 mL

## 2021-11-18 MED ORDER — HEPARIN SOD (PORK) LOCK FLUSH 100 UNIT/ML IV SOLN
500.0000 [IU] | Freq: Once | INTRAVENOUS | Status: AC
  Administered 2021-11-18: 500 [IU]

## 2021-11-18 NOTE — Progress Notes (Signed)
Released type and screen and prepare for blood transfusion on 11/20/21. ?

## 2021-11-18 NOTE — Progress Notes (Signed)
? ?  ?Palliative Medicine ?[place]  ?[phone] [phone] ? ? ?Name: [name] ?Date: 11/18/2021 ?MRN: [id number]  ?DOB: 05/20/1956 ? ?Patient Care Team: ?[name], MD as PCP - General (Family Medicine) ?[name], MD as PCP - Cardiology (Cardiology) ?Garner Nash, DO as Consulting Physician (Pulmonary Disease) ?Valrie Hart, RN as Oncology Nurse Navigator (Oncology) ?Maryanna Shape, NP as Nurse Practitioner (Oncology) ?Curt Bears, MD as Consulting Physician (Oncology) ?Pickenpack-Cousar, Carlena Sax, NP as Nurse Practitioner (Nurse Practitioner)  ? ? ?INTERVAL HISTORY: ?[name] is a 66 y.o. 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 He was admitted on 08/29/2021 and 09/15/21 from home with uncontrolled intractable chest wall pain. He has completed all of his radiation treatments. Now with plans to undergo immunotherapy as recommended by [facility].  ? ?SOCIAL HISTORY:    ? reports that he quit smoking about 5 years ago. His smoking use included cigarettes. He has a 33.00 pack-year smoking history. He has never used smokeless tobacco. He reports that he does not currently use alcohol. He reports that he does not use drugs. ? ?ADVANCE DIRECTIVES:  ?None on file  ? ?CODE STATUS:  ? ?PAST MEDICAL HISTORY: ?Past Medical History:  ?Diagnosis Date  ? Anemia   ? Anxiety   ? Arthritis   ? Basal cell carcinoma (BCC) of forehead   ? CAD (coronary artery disease)   ? a. 10/2015 ant STEMI >> LHC with 3 v CAD; oLAD tx with POBA >> emergent CABG. b. Multiple evals since that time,  early graft failure of SVG-RCA by cath 03/2016. c. 2/19 PCI/DES x1 to pRCA, normal EF.  ? Carotid artery disease ([place])   ? a. 40-59% BICA 02/2018.  ? Depression   ? Dyspnea   ? Ectopic atrial tachycardia ([place])   ? Esophageal reflux   ? eosinophil esophagitis  ? Family history of adverse reaction to anesthesia   ? "sister has PONV" (06/21/2017)  ? Former tobacco use   ? Gout   ? Hepatitis C   ? "treated and cured" (06/21/2017)  ? High cholesterol   ? History of blood transfusion   ? History of kidney stones   ? Hypertension   ? Ischemic cardiomyopathy   ? a. EF 25-30% at intraop TEE 4/17  //  b. Limited Echo 5/17 - EF 45-50%, mild ant HK. c. EF 55-65% by cath 09/2017.  ? Migraine   ? "3-4/yr" (06/21/2017)  ? Myocardial infarction ([place]) 10/2015  ? Palpitations   ? Pneumonia   ? Sinus bradycardia   ? a. HR dropping into 40s in 02/2016 -> BB reduced.  ? Stroke [facility]) 10/2016  ? "small one; sometimes my memory/cognitive issues" (06/21/2017)  ? Symptomatic hypotension   ? a. 02/2016 ER visit -> meds reduced.  ? Syncope   ? Wears dentures   ? Wears glasses   ? ? ?ALLERGIES:  is allergic to prednisone, tetanus toxoids, wellbutrin [bupropion], and varenicline. ? ?MEDICATIONS:  ?Current Outpatient Medications  ?Medication Sig Dispense Refill  ? acetaminophen (TYLENOL) 500 MG tablet Take 1,000 mg by mouth every 6 (six) hours as needed for mild pain, fever or  headache.    ? aspirin EC 81 MG tablet Take 81 mg by mouth daily. Swallow whole.    ? diclofenac (VOLTAREN) 25 MG EC tablet Take by mouth.    ? docusate sodium (COLACE) 100 MG capsule Take 100-200 mg by mouth daily.    ? DULoxetine (CYMBALTA) 30 MG capsule Take 1 capsule (30 mg total) by mouth 2 (two) times daily. 60 capsule 1  ? ferrous gluconate (FERGON) 324 MG tablet Take by mouth daily.    ? folic acid (FOLVITE) 1 MG tablet Take 1 tablet (1 mg total) by mouth daily. 30 tablet 4  ? gabapentin (NEURONTIN) 300 MG capsule Take 2 capsules (600 mg total) by mouth 3 (three) times  daily. 180 capsule 2  ? HYDROmorphone (DILAUDID) 8 MG tablet Take 1 tablet (8 mg total) by mouth every 3 (three) hours as needed for severe pain. 120 tablet 0  ? lidocaine (LIDODERM) 5 % Place 1 patch onto the skin daily as needed (for pain- Remove & Discard patch within 12 hours or as directed by MD).    ? lidocaine-prilocaine (EMLA) cream Apply to the Port-A-Cath site 30-60-minute before treatment 30 g 0  ? magnesium oxide (MAG-OX) 400 (240 Mg) MG tablet Take 1 tablet (400 mg total) by mouth daily. 30 tablet 1  ? metoprolol succinate (TOPROL-XL) 25 MG 24 hr tablet Take 1 tablet (25 mg total) by mouth daily. (Patient taking differently: Take 25 mg by mouth every evening.) 90 tablet 3  ? midodrine (PROAMATINE) 2.5 MG tablet Take 2.5 mg by mouth 3 (three) times daily as needed (AS DIRECTED).    ? morphine (MS CONTIN) 60 MG 12 hr tablet Take 1 tablet (60 mg total) by mouth every 8 (eight) hours. 90 tablet 0  ? Multiple Vitamin (MULTIVITAMIN WITH MINERALS) TABS tablet Take 1 tablet by mouth daily.    ? ondansetron (ZOFRAN) 8 MG tablet Take 1 tablet (8 mg total) by mouth every 8 (eight) hours as needed for nausea or vomiting. Starting 3 days after chemotherapy 90 tablet 2  ? pantoprazole (PROTONIX) 40 MG tablet Take 1 tablet by mouth daily at 6 AM. 30 tablet 1  ? polyethylene glycol (MIRALAX / GLYCOLAX) 17 g packet Take 17 g by mouth daily. (Patient not taking: Reported on 10/02/2021) 14 each 0  ? prochlorperazine (COMPAZINE) 10 MG tablet Take 1 tablet (10 mg total) by mouth every 6 (six) hours as needed for nausea or vomiting. 30 tablet 0  ? ranolazine (RANEXA) 1000 MG SR tablet Take 1 tablet (1,000 mg total) by mouth 2 (two) times daily. 180 tablet 3  ? senna-docusate (SENOKOT-S) 8.6-50 MG tablet Take 1 tablet by mouth 2 (two) times daily.    ? tiZANidine (ZANAFLEX) 2 MG tablet Take 1 tablet (2 mg total) by mouth at bedtime as needed for muscle spasms. 30 tablet 2  ? XARELTO 20 MG TABS tablet Take 20 mg by mouth every  evening.    ? ?No current facility-administered medications for this visit.  ? ? ?VITAL SIGNS: ?BP (!) 142/70 (BP Location: Right Arm)   Pulse 85   Temp 98.7 ?F (37.1 ?C) (Oral)   Resp 18   Ht 5\' 9"  (1.753 m)   Wt 178 lb 1.6 oz (80.8 kg)   SpO2 97%   BMI 26.30 kg/m?  ?Filed Weights  ? 11/18/21 1250  ?Weight: 178 lb 1.6 oz (80.8 kg)  ?  ?Estimated body mass index is 26.3 kg/m? as calculated from the following: ?  Height as of this encounter: 5\' 9"  (1.753 m). ?  Weight as of this encounter: 178 lb 1.6 oz (80.8 kg). ? ? ?PERFORMANCE STATUS (ECOG) : 1 - Symptomatic but completely ambulatory ? ? ?Physical Exam ?General: NAD ?Pulmonary: normal breathing pattern  ?Cardio: RRR ?Neurological: AAOx 4, appears comfortable ? ?IMPRESSION: ? ?Mr. Arakawa is here for follow-up. States some fatigue but otherwise continues to appreciate how well he is feeling. Shares he is spending valuable time with family and friends. Attended 2 basketball games over the weekend. Is able to work some from home.  ? ?Neoplasm related pain ?Pain is well controlled on current regimen. He is not having to take breakthrough medications around the clock. Has went at maximum 6 hours manageable pain.  ?MS Contin 60 mg, Dilaudid 6-8 mg, gabapentin 300 mg, and Cymbalta 30 mg. Unfortunately given his extensive disease this will be ongoing and realistic expectations are to gain somewhat better control with awareness pain will not completely resolve. If pain further escalates consideration for a more comfort hospice focus would be most appropriate in order to provide him most sufficient pain control. Will continue to closely monitor and support in any way that we can.  ? ?Constipation ?No concerns at this time. Will continue on current bowel regimen of colace and miralax daily to prevent constipation in the setting of opioid use.  ? ?Decreased appetite/weight loss ?Appetite continues to be improved. He is able to eat many of the foods he enjoy. Denies  any concerns at this time with nutrition. Current weight is remaining stable around 179lbs from 169 lbs 2/21, 174lbs on 1/16.  ? ?Goals of care ?Chrles is aware that his cancer is He is remaining hopeful for so

## 2021-11-18 NOTE — Telephone Encounter (Signed)
.  Called patient to schedule appointment per 3/22 inbasket, patient is aware of date and time.   ?

## 2021-11-18 NOTE — Progress Notes (Signed)
Blood orders entered. 

## 2021-11-19 ENCOUNTER — Other Ambulatory Visit: Payer: Self-pay | Admitting: *Deleted

## 2021-11-19 DIAGNOSIS — Z95828 Presence of other vascular implants and grafts: Secondary | ICD-10-CM

## 2021-11-20 ENCOUNTER — Other Ambulatory Visit: Payer: Self-pay

## 2021-11-20 ENCOUNTER — Inpatient Hospital Stay: Payer: Medicare Other

## 2021-11-20 DIAGNOSIS — Z5112 Encounter for antineoplastic immunotherapy: Secondary | ICD-10-CM | POA: Diagnosis not present

## 2021-11-20 DIAGNOSIS — D6481 Anemia due to antineoplastic chemotherapy: Secondary | ICD-10-CM

## 2021-11-20 MED ORDER — ACETAMINOPHEN 325 MG PO TABS
650.0000 mg | ORAL_TABLET | Freq: Once | ORAL | Status: AC
  Administered 2021-11-20: 650 mg via ORAL
  Filled 2021-11-20: qty 2

## 2021-11-20 MED ORDER — HEPARIN SOD (PORK) LOCK FLUSH 100 UNIT/ML IV SOLN
500.0000 [IU] | Freq: Every day | INTRAVENOUS | Status: AC | PRN
  Administered 2021-11-20: 500 [IU]

## 2021-11-20 MED ORDER — SODIUM CHLORIDE 0.9% IV SOLUTION
250.0000 mL | Freq: Once | INTRAVENOUS | Status: AC
  Administered 2021-11-20: 250 mL via INTRAVENOUS

## 2021-11-20 MED ORDER — SODIUM CHLORIDE 0.9% FLUSH
10.0000 mL | INTRAVENOUS | Status: AC | PRN
  Administered 2021-11-20: 10 mL

## 2021-11-20 MED ORDER — DIPHENHYDRAMINE HCL 25 MG PO CAPS
25.0000 mg | ORAL_CAPSULE | Freq: Once | ORAL | Status: AC
  Administered 2021-11-20: 25 mg via ORAL
  Filled 2021-11-20: qty 1

## 2021-11-20 NOTE — Patient Instructions (Signed)
Blood Transfusion, Adult A blood transfusion is a procedure in which you receive blood or a type of blood cell (blood component) through an IV. You may need a blood transfusion when your blood level is low. This may result from a bleeding disorder, illness, injury, or surgery. The blood may come from a donor. You may also be able to donate blood for yourself (autologous blood donation) before a planned surgery. The blood given in a transfusion is made up of different blood components. You may receive: Red blood cells. These carry oxygen to the cells in the body. Platelets. These help your blood to clot. Plasma. This is the liquid part of your blood. It carries proteins and other substances throughout the body. White blood cells. These help you fight infections. If you have hemophilia or another clotting disorder, you may also receive other types of blood products. Tell a health care provider about: Any blood disorders you have. Any previous reactions you have had during a blood transfusion. Any allergies you have. All medicines you are taking, including vitamins, herbs, eye drops, creams, and over-the-counter medicines. Any surgeries you have had. Any medical conditions you have, including any recent fever or cold symptoms. Whether you are pregnant or may be pregnant. What are the risks? Generally, this is a safe procedure. However, problems may occur. The most common problems include: A mild allergic reaction, such as red, swollen areas of skin (hives) and itching. Fever or chills. This may be the body's response to new blood cells received. This may occur during or up to 4 hours after the transfusion. More serious problems may include: Transfusion-associated circulatory overload (TACO), or too much fluid in the lungs. This may cause breathing problems. A serious allergic reaction, such as difficulty breathing or swelling around the face and lips. Transfusion-related acute lung injury  (TRALI), which causes breathing difficulty and low oxygen in the blood. This can occur within hours of the transfusion or several days later. Iron overload. This can happen after receiving many blood transfusions over a period of time. Infection or virus being transmitted. This is rare because donated blood is carefully tested before it is given. Hemolytic transfusion reaction. This is rare. It happens when your body's defense system (immune system)tries to attack the new blood cells. Symptoms may include fever, chills, nausea, low blood pressure, and low back or chest pain. Transfusion-associated graft-versus-host disease (TAGVHD). This is rare. It happens when donated cells attack your body's healthy tissues. What happens before the procedure? Medicines Ask your health care provider about: Changing or stopping your regular medicines. This is especially important if you are taking diabetes medicines or blood thinners. Taking medicines such as aspirin and ibuprofen. These medicines can thin your blood. Do not take these medicines unless your health care provider tells you to take them. Taking over-the-counter medicines, vitamins, herbs, and supplements. General instructions Follow instructions from your health care provider about eating and drinking restrictions. You will have a blood test to determine your blood type. This is necessary to know what kind of blood your body will accept and to match it to the donor blood. If you are going to have a planned surgery, you may be able to do an autologous blood donation. This may be done in case you need to have a transfusion. You will have your temperature, blood pressure, and pulse monitored before the transfusion. If you have had an allergic reaction to a transfusion in the past, you may be given medicine to help prevent   a reaction. This medicine may be given to you by mouth (orally) or through an IV. Set aside time for the blood transfusion. This  procedure generally takes 1-4 hours to complete. What happens during the procedure?  An IV will be inserted into one of your veins. The bag of donated blood will be attached to your IV. The blood will then enter through your vein. Your temperature, blood pressure, and pulse will be monitored regularly during the transfusion. This monitoring is done to detect early signs of a transfusion reaction. Tell your nurse right away if you have any of these symptoms during the transfusion: Shortness of breath or trouble breathing. Chest or back pain. Fever or chills. Hives or itching. If you have any signs or symptoms of a reaction, your transfusion will be stopped and you may be given medicine. When the transfusion is complete, your IV will be removed. Pressure may be applied to the IV site for a few minutes. A bandage (dressing)will be applied. The procedure may vary among health care providers and hospitals. What happens after the procedure? Your temperature, blood pressure, pulse, breathing rate, and blood oxygen level will be monitored until you leave the hospital or clinic. Your blood may be tested to see how you are responding to the transfusion. You may be warmed with fluids or blankets to maintain a normal body temperature. If you receive your blood transfusion in an outpatient setting, you will be told whom to contact to report any reactions. Where to find more information For more information on blood transfusions, visit the American [employer]: redcross.org Summary A blood transfusion is a procedure in which you receive blood or a type of blood cell (blood component) through an IV. The blood you receive may come from a donor or be donated by yourself (autologous blood donation) before a planned surgery. The blood given in a transfusion is made up of different blood components. You may receive red blood cells, platelets, plasma, or white blood cells depending on the condition treated. Your  temperature, blood pressure, and pulse will be monitored before, during, and after the transfusion. After the transfusion, your blood may be tested to see how your body has responded. This information is not intended to replace advice given to you by your health care provider. Make sure you discuss any questions you have with your health care provider. Document Revised: 06/21/2019 Document Reviewed: 02/08/2019 Elsevier Patient Education  2022 Elsevier Inc.  

## 2021-11-21 ENCOUNTER — Other Ambulatory Visit (HOSPITAL_COMMUNITY): Payer: Self-pay

## 2021-11-21 LAB — TYPE AND SCREEN
ABO/RH(D): O POS
Antibody Screen: NEGATIVE
Unit division: 0
Unit division: 0

## 2021-11-21 LAB — BPAM RBC
Blood Product Expiration Date: 202304192359
Blood Product Expiration Date: 202304192359
ISSUE DATE / TIME: 202303240852
ISSUE DATE / TIME: 202303240852
Unit Type and Rh: 5100
Unit Type and Rh: 5100

## 2021-11-23 ENCOUNTER — Encounter: Payer: Self-pay | Admitting: Internal Medicine

## 2021-11-23 ENCOUNTER — Encounter: Payer: Self-pay | Admitting: Physician Assistant

## 2021-11-23 ENCOUNTER — Other Ambulatory Visit: Payer: Self-pay | Admitting: Nurse Practitioner

## 2021-11-23 ENCOUNTER — Other Ambulatory Visit (HOSPITAL_COMMUNITY): Payer: Self-pay

## 2021-11-23 ENCOUNTER — Ambulatory Visit
Admission: RE | Admit: 2021-11-23 | Discharge: 2021-11-23 | Disposition: A | Discharge status: Home / Self Care | Payer: Medicare Other | Source: Ambulatory Visit | Attending: Physician Assistant | Admitting: Physician Assistant

## 2021-11-23 DIAGNOSIS — C7A1 Malignant poorly differentiated neuroendocrine tumors: Secondary | ICD-10-CM | POA: Insufficient documentation

## 2021-11-23 MED ORDER — MORPHINE SULFATE ER 60 MG PO TBCR: 60.0000 mg | EXTENDED_RELEASE_TABLET | Freq: Three times a day (TID) | ORAL | 0 refills | Status: DC

## 2021-11-23 MED ORDER — GABAPENTIN 300 MG PO CAPS
600.0000 mg | ORAL_CAPSULE | Freq: Three times a day (TID) | ORAL | 2 refills | Status: DC
  Filled 2021-11-23: qty 180, 30d supply, fill #0

## 2021-11-23 MED ORDER — HYDROMORPHONE HCL 8 MG PO TABS
8.0000 mg | ORAL_TABLET | ORAL | 0 refills | Status: DC | PRN
  Filled 2021-11-23: qty 120, 15d supply, fill #0

## 2021-11-23 NOTE — Progress Notes (Signed)
?  Radiation Oncology         [phone] ?________________________________ ? ?Name: [name] MRN: [id number]  ?Date of Service: 11/23/2021  DOB: Feb 28, 1956 ? ?Post Treatment Telephone Note ? ?Diagnosis:   Stage IV, pT1cN1M1b, large cell neuroendocrine carcinoma of the RUL with brain metastases.  ? ?Intent: Palliative ? ?Radiation Treatment Dates: 10/27/2021 SRS Treatment ?Site Technique Total Dose (Gy) Dose per Fx (Gy) Completed Fx Beam Energies  ?Brain: Brain ?[id number] 3D 20/20 20 1/1 6XFFF  ? ?Narrative: The patient tolerated radiation therapy relatively well.  He reports in the last 3-4 days he's had day time headaches that seem to come and go without pattern. The symptoms have also coincided with feeling fatigued and he has recently been managing diarrhea, and anemia secondary to his immunotherapy. He had a transfusion for a hgb in the 7 range last Friday. All weekened he was in bed. He has had some improvement in his chest wall pain and was starting to take less pain medication, but then in the last day or so has had more discomfort and is back to use normal regimen. He denies any nausea, vomiting, light sensitivity or aura like symptoms. He is not sensitive to smells regarding headaches. No other complaints are verbalized.  ? ? ? ? ?Impression/Plan: ?1. 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 . The patient has been doing well since completion of radiotherapy. We discussed that we would be happy to continue to follow him as needed, but he will also continue to follow up with Dr. [name] and Dr. [name] while receiving immunotherapy in medical oncology.  ?2. Headaches. While this could be the result of recent volume shifts from transfusion, dehydration, or narcotics, since he is on immunotherapy he could have pseudoprogression with picture of radionecrosis as well for his prior treatment PTVs. Since he is actively receiving immunotherapy it would be  best to avoid steroids. I'll reach out to Dr. [name] to see if he would recommend vitamin e and trental, or if he feels the patient should be evaluated prior to changes in therapeutic interventions for his symptoms.  ? ? ? ? ?Carola Rhine, PAC  ? ? ? ? ?

## 2021-11-24 ENCOUNTER — Other Ambulatory Visit: Payer: Self-pay | Admitting: Radiation Oncology

## 2021-11-24 ENCOUNTER — Telehealth: Payer: Self-pay | Admitting: *Deleted

## 2021-11-24 MED ORDER — DEXAMETHASONE 4 MG PO TABS: ORAL_TABLET | ORAL | 0 refills | Status: DC

## 2021-11-24 NOTE — Telephone Encounter (Signed)
Spoke with the patient to let him know that we have sent in a prescription for Decadron to his pharmacy.  We discussed how to take the medication and I informed him instructions will be on the bottle as well.  He verbalized understanding.  He was advised to give [name] a call at the end of the week if he does not feel any better.   ? ?Kimba Lottes M. Leonie Green, BSN  ?

## 2021-11-24 NOTE — Progress Notes (Signed)
Dr. [name] reviewed my notes from yesterday's discussion with the patient. He recommends a short course of steroids with 4mg  daily decadron x3 days, then 2mg  daily thereafter limit to less than 14 days total if possible. We will contact the patient to coordinate this, but a new prescription was sent to his pharmacy. ?

## 2021-11-25 ENCOUNTER — Other Ambulatory Visit: Payer: Self-pay

## 2021-11-25 ENCOUNTER — Inpatient Hospital Stay: Payer: Medicare Other

## 2021-11-25 DIAGNOSIS — Z95828 Presence of other vascular implants and grafts: Secondary | ICD-10-CM

## 2021-11-25 DIAGNOSIS — C7A1 Malignant poorly differentiated neuroendocrine tumors: Secondary | ICD-10-CM

## 2021-11-25 DIAGNOSIS — Z5112 Encounter for antineoplastic immunotherapy: Secondary | ICD-10-CM | POA: Diagnosis not present

## 2021-11-25 DIAGNOSIS — D6481 Anemia due to antineoplastic chemotherapy: Secondary | ICD-10-CM

## 2021-11-25 LAB — CMP (CANCER CENTER ONLY)
ALT: 36 U/L (ref 0–44)
AST: 34 U/L (ref 15–41)
Albumin: 3.6 g/dL (ref 3.5–5.0)
Alkaline Phosphatase: 143 U/L — ABNORMAL HIGH (ref 38–126)
Anion gap: 8 (ref 5–15)
BUN: 21 mg/dL (ref 8–23)
CO2: 24 mmol/L (ref 22–32)
Calcium: 9.5 mg/dL (ref 8.9–10.3)
Chloride: 105 mmol/L (ref 98–111)
Creatinine: 0.72 mg/dL (ref 0.61–1.24)
GFR, Estimated: >60 mL/min (ref >60)
Glucose, Bld: 124 mg/dL — ABNORMAL HIGH (ref 70–99)
Potassium: 4 mmol/L (ref 3.5–5.1)
Sodium: 137 mmol/L (ref 135–145)
Total Bilirubin: 0.4 mg/dL (ref 0.3–1.2)
Total Protein: 7 g/dL (ref 6.5–8.1)

## 2021-11-25 LAB — CBC WITH DIFFERENTIAL (CANCER CENTER ONLY)
Abs Immature Granulocytes: 0.08 10*3/uL — ABNORMAL HIGH (ref 0.00–0.07)
Basophils Absolute: 0 10*3/uL (ref 0.0–0.1)
Basophils Relative: 0 %
Eosinophils Absolute: 0 10*3/uL (ref 0.0–0.5)
Eosinophils Relative: 0 %
HCT: 33 % — ABNORMAL LOW (ref 39.0–52.0)
Hemoglobin: 10.8 g/dL — ABNORMAL LOW (ref 13.0–17.0)
Immature Granulocytes: 1 %
Lymphocytes Relative: 8 %
Lymphs Abs: 0.7 10*3/uL (ref 0.7–4.0)
MCH: 31.4 pg (ref 26.0–34.0)
MCHC: 32.7 g/dL (ref 30.0–36.0)
MCV: 95.9 fL (ref 80.0–100.0)
Monocytes Absolute: 0.9 10*3/uL (ref 0.1–1.0)
Monocytes Relative: 10 %
Neutro Abs: 6.9 10*3/uL (ref 1.7–7.7)
Neutrophils Relative %: 81 %
Platelet Count: 178 10*3/uL (ref 150–400)
RBC: 3.44 MIL/uL — ABNORMAL LOW (ref 4.22–5.81)
RDW: 18.5 % — ABNORMAL HIGH (ref 11.5–15.5)
WBC Count: 8.6 10*3/uL (ref 4.0–10.5)
nRBC: 0 % (ref 0.0–0.2)

## 2021-11-25 LAB — SAMPLE TO BLOOD BANK

## 2021-11-25 MED ORDER — SODIUM CHLORIDE 0.9% FLUSH
10.0000 mL | Freq: Once | INTRAVENOUS | Status: AC
  Administered 2021-11-25: 10 mL

## 2021-11-25 MED ORDER — HEPARIN SOD (PORK) LOCK FLUSH 100 UNIT/ML IV SOLN
500.0000 [IU] | Freq: Once | INTRAVENOUS | Status: AC
  Administered 2021-11-25: 500 [IU]

## 2021-11-26 NOTE — Progress Notes (Signed)
[facility] ?OFFICE PROGRESS NOTE ? ?[name], MD ?[address] ?[place] [address] ? ?DIAGNOSIS:  Stage IV (pT1c, N1, M1b) large cell neuroendocrine carcinoma.  He presented with a right upper lobe lung nodule and hilar lymphadenopathy.  He was diagnosed in April 2022. He also had a small metastatic brain lesion.  The patient has local recurrence of his disease and the right chest wall diagnosed in November 2022. ?Molecular studies by foundation 1 showed no actionable mutations ?  ?PDL1: 0% ? ?Prior Therapy:  ?1) Robotic assisted thoracic surgery with a right upper lobectomy which was performed on 12/24/2020.  ?2) SRS to a small metastatic brain lesion completed on February 13, 2021 under the care of Dr. Lisbeth Renshaw.   ?3) Adjuvant chemotherapy with cisplatin 80 mg/m2 on day 1, etoposide 100 mg per metered squared on days 1, 2, and 3, IV every 3 weeks. First dose on 02/09/21.  Status post 4 cycles. ?4) SRS to 4 new brain metastasis under the care of Dr. Lisbeth Renshaw, Last dose on 08/26/21 ?5) Palliative radiation to right chest wall from 09/01/21-09/23/21 under the care of Dr. Lisbeth Renshaw ?6) 2) SRS to the new brain lesion along the thalamus under the care of Dr. Lisbeth Renshaw. Completed on 10/27/21 ? ?CURRENT THERAPY: 1) palliative systemic chemotherapy with carboplatin for an AUC of 5, Alimta 500 mg per metered square, Keytruda 200 mg IV every 3 weeks.  First dose on 09/29/21.  Carboplatin has been removed from cycle #2 due to pancytopenia.  Status post 3 cycles.  ? ? ?INTERVAL HISTORY: ?[name] 66 y.o. male returns to the clinic today for a follow-up visit.  The patient is feeling fairly well today without any concerning complaints. He has some right rib soreness due to reaching wrong the other day. He put lidocaine patches on which helped. The patient's carboplatin was removed from his treatment plan due to intolerance.  He is currently on Keytruda which is immunotherapy and Alimta (chemotherapy) he is  tolerating this well.  The patient denies any fevers, chills, or night sweats.  He is followed closely by palliative care for his prior intractable chest wall pain.  He is currently taking MS Contin 60 mg twice daily, Dilaudid, gabapentin, and Cymbalta.  He is also on medication for muscle spasms.  He is also on a bowel regimen to prevent constipation. However, he has not needed to take this due to having normal bowel habits and prior diarrhea. The patient is currently on steroids for a headache prescribed by Dr. [name] for headaches.  This has improved his headaches. He denies significant nausea/vomiting; with the exception of Friday 11/27/21 he had significant nausea/vomiting. He took compazine and zofran. He reports this was "out of the blue". He is better at this time.  Denies any changes with shortness of breath.  Denies any hemoptysis.  He has some dyspnea on exertion but reports his breathing is "overall good". He denies cough. He is supposed have a restaging CT scan but has not been scheduled until 12/04/2021.  She is here today for evaluation and repeat blood work before starting cycle #4.  ? ?MEDICAL HISTORY: ?Past Medical History:  ?Diagnosis Date  ? Anemia   ? Anxiety   ? Arthritis   ? Basal cell carcinoma (BCC) of forehead   ? CAD (coronary artery disease)   ? a. 10/2015 ant STEMI >> LHC with 3 v CAD; oLAD tx with POBA >> emergent CABG. b. Multiple evals since  that time, early graft failure of SVG-RCA by cath 03/2016. c. 2/19 PCI/DES x1 to pRCA, normal EF.  ? Carotid artery disease ([place])   ? a. 40-59% BICA 02/2018.  ? Depression   ? Dyspnea   ? Ectopic atrial tachycardia ([place])   ? Esophageal reflux   ? eosinophil esophagitis  ? Family history of adverse reaction to anesthesia   ? "sister has PONV" (06/21/2017)  ? Former tobacco use   ? Gout   ? Hepatitis C   ? "treated and cured" (06/21/2017)  ? High cholesterol   ? History of blood transfusion   ? History of kidney stones   ? Hypertension   ? Ischemic  cardiomyopathy   ? a. EF 25-30% at intraop TEE 4/17  //  b. Limited Echo 5/17 - EF 45-50%, mild ant HK. c. EF 55-65% by cath 09/2017.  ? Migraine   ? "3-4/yr" (06/21/2017)  ? Myocardial infarction ([place]) 10/2015  ? Palpitations   ? Pneumonia   ? Sinus bradycardia   ? a. HR dropping into 40s in 02/2016 -> BB reduced.  ? Stroke [facility]) 10/2016  ? "small one; sometimes my memory/cognitive issues" (06/21/2017)  ? Symptomatic hypotension   ? a. 02/2016 ER visit -> meds reduced.  ? Syncope   ? Wears dentures   ? Wears glasses   ? ? ?ALLERGIES:  is allergic to prednisone, tetanus toxoids, wellbutrin [bupropion], and varenicline. ? ?MEDICATIONS:  ?Current Outpatient Medications  ?Medication Sig Dispense Refill  ? acetaminophen (TYLENOL) 500 MG tablet Take 1,000 mg by mouth every 6 (six) hours as needed for mild pain, fever or headache.    ? aspirin EC 81 MG tablet Take 81 mg by mouth daily. Swallow whole.    ? dexamethasone (DECADRON) 4 MG tablet Take one tablet PO daily x3 days, then 1/2 tablet PO daily; last dose on 12/04/21 9 tablet 0  ? diclofenac (VOLTAREN) 25 MG EC tablet Take by mouth.    ? DULoxetine (CYMBALTA) 30 MG capsule Take 1 capsule (30 mg total) by mouth 2 (two) times daily. 60 capsule 1  ? ferrous gluconate (FERGON) 324 MG tablet Take by mouth daily.    ? folic acid (FOLVITE) 1 MG tablet Take 1 tablet (1 mg total) by mouth daily. 30 tablet 4  ? gabapentin (NEURONTIN) 300 MG capsule Take 2 capsules (600 mg total) by mouth 3 (three) times daily. 180 capsule 2  ? HYDROmorphone (DILAUDID) 8 MG tablet Take 1 tablet (8 mg total) by mouth every 3 (three) hours as needed for severe pain. 120 tablet 0  ? lidocaine (LIDODERM) 5 % Place 1 patch onto the skin daily as needed (for pain- Remove & Discard patch within 12 hours or as directed by MD).    ? lidocaine-prilocaine (EMLA) cream Apply to the Port-A-Cath site 30-60-minute before treatment 30 g 0  ? magnesium oxide (MAG-OX) 400 (240 Mg) MG tablet Take 1 tablet (400 mg  total) by mouth daily. 30 tablet 1  ? methylPREDNISolone (MEDROL DOSEPAK) 4 MG TBPK tablet See admin instructions. follow package directions    ? metoprolol succinate (TOPROL-XL) 25 MG 24 hr tablet Take 1 tablet (25 mg total) by mouth daily. (Patient taking differently: Take 25 mg by mouth every evening.) 90 tablet 3  ? midodrine (PROAMATINE) 2.5 MG tablet Take 2.5 mg by mouth 3 (three) times daily as needed (AS DIRECTED).    ? morphine (MS CONTIN) 60 MG 12 hr tablet Take 1 tablet (60 mg total)  by mouth every 8 (eight) hours. 90 tablet 0  ? Multiple Vitamin (MULTIVITAMIN WITH MINERALS) TABS tablet Take 1 tablet by mouth daily.    ? ondansetron (ZOFRAN) 8 MG tablet Take 1 tablet (8 mg total) by mouth every 8 (eight) hours as needed for nausea or vomiting. Starting 3 days after chemotherapy 90 tablet 2  ? pantoprazole (PROTONIX) 40 MG tablet Take 1 tablet by mouth daily at 6 AM. 30 tablet 1  ? prochlorperazine (COMPAZINE) 10 MG tablet Take 1 tablet (10 mg total) by mouth every 6 (six) hours as needed for nausea or vomiting. 30 tablet 0  ? ranolazine (RANEXA) 1000 MG SR tablet Take 1 tablet (1,000 mg total) by mouth 2 (two) times daily. 180 tablet 3  ? senna-docusate (SENOKOT-S) 8.6-50 MG tablet Take 1 tablet by mouth 2 (two) times daily.    ? tiZANidine (ZANAFLEX) 2 MG tablet Take 1 tablet (2 mg total) by mouth at bedtime as needed for muscle spasms. 30 tablet 2  ? XARELTO 20 MG TABS tablet Take 20 mg by mouth every evening.    ? colchicine 0.6 MG tablet Take by mouth. (Patient not taking: Reported on 11/30/2021)    ? docusate sodium (COLACE) 100 MG capsule Take 100-200 mg by mouth daily. (Patient not taking: Reported on 11/27/2021)    ? polyethylene glycol (MIRALAX / GLYCOLAX) 17 g packet Take 17 g by mouth daily. (Patient not taking: Reported on 11/27/2021) 14 each 0  ? ?No current facility-administered medications for this visit.  ? ? ?SURGICAL HISTORY:  ?Past Surgical History:  ?Procedure Laterality Date  ? 47 GAUGE  PARS PLANA VITRECTOMY WITH 20 GAUGE MVR PORT  12/31/2020  ? Procedure: 25 GAUGE PARS PLANA VITRECTOMY WITH 20 GAUGE MVR PORT;  Surgeon: [name], MD;  Location: [facility];  Service: Thoracic;;  ? ANTERIOR CERV

## 2021-11-27 ENCOUNTER — Ambulatory Visit (INDEPENDENT_AMBULATORY_CARE_PROVIDER_SITE_OTHER): Payer: Medicare Other | Admitting: Thoracic Surgery (Cardiothoracic Vascular Surgery)

## 2021-11-27 VITALS — BP 118/75 | HR 92 | Resp 20 | Ht 69.0 in | Wt 180.0 lb

## 2021-11-27 DIAGNOSIS — C3411 Malignant neoplasm of upper lobe, right bronchus or lung: Secondary | ICD-10-CM | POA: Diagnosis not present

## 2021-11-27 NOTE — Progress Notes (Signed)
? ?   ?  [address] ?      [place] [address] ?            [phone]       ? ?[name] ?[place] Record #[id number] ?Date of Birth: [birth year] ? ?Referring: Garner Nash, DO ?Primary Care: [name], MD ?Primary Cardiologist:Jayadeep Irish Lack, MD ? ?Reason for visit:   follow-up ? ?History of Present Illness:     ?[name] comes in for his follow-up appointment.  He is a 66 year old male that underwent right upper lobectomy for large cell neuroendocrine tumor.  His hospitalization was complicated by prolonged air leak.  He was noted to have positive lymph nodes on pathology, and further evaluation revealed metastatic disease in his brain.  Several months after surgery and was noted to have painful nodules along his inferior chest wall by the assistant port, and then underwent excision which revealed metastatic disease there as well. ? ?He is now completed his third cycle of adjuvant therapy.  His chest wall pain is much improved and the nodules are no longer palpable.  His energy is improving, and his diet and appetite have also improved. ? ?Physical Exam: ?BP 118/75 (BP Location: Left Arm)   Pulse 92   Resp 20   Ht 5\' 9"  (1.753 m)   Wt 180 lb (81.6 kg)   SpO2 96% Comment: RA  BMI 26.58 kg/m?  ? ?Alert NAD ?Incision clean, no palpable nodules.Marland Kitchen   ?Abdomen, ND ?No peripheral edema ? ? ?Diagnostic Studies & Laboratory data: ?CT chest: ? IMPRESSION: ?1. No new findings within the chest, abdomen or pelvis. No pulmonary ?embolism. No evidence of pneumonia or pulmonary edema. ?2. Stable small RIGHT pleural effusion, with overlying mild ?atelectasis. ?3. Bilateral pulmonary nodules and soft tissue masses within the ?soft tissues of the RIGHT chest wall, as described above, not ?significantly changed in the short-term interval compared to the ?earlier study of 09/15/2021. ?4. Stable destructive change within the posterior eleventh rib. ?5. No acute findings within the  abdomen or pelvis. No evidence of ?metastatic disease within the abdomen or pelvis. ? ?Assessment / Plan:   ?66 year old male with stage IV large cell endocrine tumor of the right upper lobe.  He is currently being treated with more adjuvant therapy.  He is currently being managed by medical oncology team.  He will continue his surveillance and treatment with them.  He will follow-up with me as needed. ? ? ?Lucile Crater Kaci Freel ?11/27/2021 2:33 PM ? ? ? ? ? ? ?

## 2021-11-30 ENCOUNTER — Encounter: Payer: Self-pay | Admitting: Nurse Practitioner

## 2021-11-30 ENCOUNTER — Other Ambulatory Visit: Payer: Self-pay | Admitting: Radiation Therapy

## 2021-11-30 ENCOUNTER — Other Ambulatory Visit: Payer: Self-pay

## 2021-11-30 ENCOUNTER — Encounter: Payer: Self-pay | Admitting: Physician Assistant

## 2021-11-30 ENCOUNTER — Inpatient Hospital Stay: Payer: Medicare Other

## 2021-11-30 ENCOUNTER — Inpatient Hospital Stay (HOSPITAL_BASED_OUTPATIENT_CLINIC_OR_DEPARTMENT_OTHER): Payer: Medicare Other | Admitting: Physician Assistant

## 2021-11-30 ENCOUNTER — Inpatient Hospital Stay: Payer: Medicare Other | Attending: Physician Assistant

## 2021-11-30 ENCOUNTER — Telehealth: Payer: Self-pay

## 2021-11-30 ENCOUNTER — Inpatient Hospital Stay (HOSPITAL_BASED_OUTPATIENT_CLINIC_OR_DEPARTMENT_OTHER): Payer: Medicare Other | Admitting: Nurse Practitioner

## 2021-11-30 VITALS — BP 146/77 | HR 83 | Temp 97.6°F | Resp 18 | Ht 69.0 in | Wt 187.0 lb

## 2021-11-30 DIAGNOSIS — Z515 Encounter for palliative care: Secondary | ICD-10-CM

## 2021-11-30 DIAGNOSIS — C3491 Malignant neoplasm of unspecified part of right bronchus or lung: Secondary | ICD-10-CM

## 2021-11-30 DIAGNOSIS — Z95828 Presence of other vascular implants and grafts: Secondary | ICD-10-CM

## 2021-11-30 DIAGNOSIS — R5382 Chronic fatigue, unspecified: Secondary | ICD-10-CM

## 2021-11-30 DIAGNOSIS — Z5112 Encounter for antineoplastic immunotherapy: Secondary | ICD-10-CM | POA: Insufficient documentation

## 2021-11-30 DIAGNOSIS — R530 Neoplastic (malignant) related fatigue: Secondary | ICD-10-CM

## 2021-11-30 DIAGNOSIS — C7A1 Malignant poorly differentiated neuroendocrine tumors: Secondary | ICD-10-CM

## 2021-11-30 DIAGNOSIS — Z5111 Encounter for antineoplastic chemotherapy: Secondary | ICD-10-CM | POA: Diagnosis present

## 2021-11-30 DIAGNOSIS — G893 Neoplasm related pain (acute) (chronic): Secondary | ICD-10-CM | POA: Diagnosis not present

## 2021-11-30 DIAGNOSIS — Z79899 Other long term (current) drug therapy: Secondary | ICD-10-CM | POA: Diagnosis not present

## 2021-11-30 DIAGNOSIS — C7931 Secondary malignant neoplasm of brain: Secondary | ICD-10-CM

## 2021-11-30 DIAGNOSIS — K5903 Drug induced constipation: Secondary | ICD-10-CM

## 2021-11-30 LAB — CBC WITH DIFFERENTIAL (CANCER CENTER ONLY)
Abs Immature Granulocytes: 0.16 10*3/uL — ABNORMAL HIGH (ref 0.00–0.07)
Basophils Absolute: 0 10*3/uL (ref 0.0–0.1)
Basophils Relative: 0 %
Eosinophils Absolute: 0.1 10*3/uL (ref 0.0–0.5)
Eosinophils Relative: 1 %
HCT: 33.7 % — ABNORMAL LOW (ref 39.0–52.0)
Hemoglobin: 10.9 g/dL — ABNORMAL LOW (ref 13.0–17.0)
Immature Granulocytes: 2 %
Lymphocytes Relative: 11 %
Lymphs Abs: 1.1 10*3/uL (ref 0.7–4.0)
MCH: 31.8 pg (ref 26.0–34.0)
MCHC: 32.3 g/dL (ref 30.0–36.0)
MCV: 98.3 fL (ref 80.0–100.0)
Monocytes Absolute: 1.2 10*3/uL — ABNORMAL HIGH (ref 0.1–1.0)
Monocytes Relative: 12 %
Neutro Abs: 7.5 10*3/uL (ref 1.7–7.7)
Neutrophils Relative %: 74 %
Platelet Count: 242 10*3/uL (ref 150–400)
RBC: 3.43 MIL/uL — ABNORMAL LOW (ref 4.22–5.81)
RDW: 19.2 % — ABNORMAL HIGH (ref 11.5–15.5)
WBC Count: 10 10*3/uL (ref 4.0–10.5)
nRBC: 0 % (ref 0.0–0.2)

## 2021-11-30 LAB — CMP (CANCER CENTER ONLY)
ALT: 41 U/L (ref 0–44)
AST: 33 U/L (ref 15–41)
Albumin: 3.4 g/dL — ABNORMAL LOW (ref 3.5–5.0)
Alkaline Phosphatase: 110 U/L (ref 38–126)
Anion gap: 4 — ABNORMAL LOW (ref 5–15)
BUN: 29 mg/dL — ABNORMAL HIGH (ref 8–23)
CO2: 30 mmol/L (ref 22–32)
Calcium: 9 mg/dL (ref 8.9–10.3)
Chloride: 102 mmol/L (ref 98–111)
Creatinine: 0.9 mg/dL (ref 0.61–1.24)
GFR, Estimated: >60 mL/min (ref >60)
Glucose, Bld: 90 mg/dL (ref 70–99)
Potassium: 4.8 mmol/L (ref 3.5–5.1)
Sodium: 136 mmol/L (ref 135–145)
Total Bilirubin: 0.4 mg/dL (ref 0.3–1.2)
Total Protein: 6.6 g/dL (ref 6.5–8.1)

## 2021-11-30 MED ORDER — MORPHINE SULFATE ER 60 MG PO TBCR: 60.0000 mg | EXTENDED_RELEASE_TABLET | Freq: Three times a day (TID) | ORAL | 0 refills | Status: DC

## 2021-11-30 MED ORDER — SODIUM CHLORIDE 0.9 % IV SOLN
200.0000 mg | Freq: Once | INTRAVENOUS | Status: AC
  Administered 2021-11-30: 200 mg via INTRAVENOUS
  Filled 2021-11-30: qty 200

## 2021-11-30 MED ORDER — SODIUM CHLORIDE 0.9 % IV SOLN: Freq: Once | INTRAVENOUS | Status: AC

## 2021-11-30 MED ORDER — SODIUM CHLORIDE 0.9 % IV SOLN
500.0000 mg/m2 | Freq: Once | INTRAVENOUS | Status: AC
  Administered 2021-11-30: 1000 mg via INTRAVENOUS
  Filled 2021-11-30: qty 40

## 2021-11-30 MED ORDER — SODIUM CHLORIDE 0.9% FLUSH
10.0000 mL | INTRAVENOUS | Status: DC | PRN
  Administered 2021-11-30: 10 mL

## 2021-11-30 MED ORDER — PROCHLORPERAZINE MALEATE 10 MG PO TABS
10.0000 mg | ORAL_TABLET | Freq: Four times a day (QID) | ORAL | Status: DC | PRN
  Administered 2021-11-30: 10 mg via ORAL
  Filled 2021-11-30: qty 1

## 2021-11-30 MED ORDER — SODIUM CHLORIDE 0.9% FLUSH
10.0000 mL | Freq: Once | INTRAVENOUS | Status: AC
  Administered 2021-11-30: 10 mL

## 2021-11-30 MED ORDER — HEPARIN SOD (PORK) LOCK FLUSH 100 UNIT/ML IV SOLN
500.0000 [IU] | Freq: Once | INTRAVENOUS | Status: AC | PRN
  Administered 2021-11-30: 500 [IU]

## 2021-11-30 NOTE — Patient Instructions (Signed)
[facility]  Discharge Instructions: Thank you for choosing [facility] to provide your oncology and hematology care.   If you have a lab appointment with the Cancer Center, please go directly to the Cancer Center and check in at the registration area.   Wear comfortable clothing and clothing appropriate for easy access to any Portacath or PICC line.   We strive to give you quality time with your provider. You may need to reschedule your appointment if you arrive late (15 or more minutes).  Arriving late affects you and other patients whose appointments are after yours.  Also, if you miss three or more appointments without notifying the office, you may be dismissed from the clinic at the provider's discretion.      For prescription refill requests, have your pharmacy contact our office and allow 72 hours for refills to be completed.    Today you received the following chemotherapy and/or immunotherapy agents: Keytruda/Alimta.      To help prevent nausea and vomiting after your treatment, we encourage you to take your nausea medication as directed.  BELOW ARE SYMPTOMS THAT SHOULD BE REPORTED IMMEDIATELY: *FEVER GREATER THAN 100.4 F (38 C) OR HIGHER *CHILLS OR SWEATING *NAUSEA AND VOMITING THAT IS NOT CONTROLLED WITH YOUR NAUSEA MEDICATION *UNUSUAL SHORTNESS OF BREATH *UNUSUAL BRUISING OR BLEEDING *URINARY PROBLEMS (pain or burning when urinating, or frequent urination) *BOWEL PROBLEMS (unusual diarrhea, constipation, pain near the anus) TENDERNESS IN MOUTH AND THROAT WITH OR WITHOUT PRESENCE OF ULCERS (sore throat, sores in mouth, or a toothache) UNUSUAL RASH, SWELLING OR PAIN  UNUSUAL VAGINAL DISCHARGE OR ITCHING   Items with * indicate a potential emergency and should be followed up as soon as possible or go to the Emergency Department if any problems should occur.  Please show the CHEMOTHERAPY ALERT CARD or IMMUNOTHERAPY ALERT CARD at  check-in to the Emergency Department and triage nurse.  Should you have questions after your visit or need to cancel or reschedule your appointment, please contact [facility]  Dept: [phone]  and follow the prompts.  Office hours are 8:00 a.m. to 4:30 p.m. Monday - Friday. Please note that voicemails left after 4:00 p.m. may not be returned until the following business day.  We are closed weekends and major holidays. You have access to a nurse at all times for urgent questions. Please call the main number to the clinic Dept: [phone] and follow the prompts.   For any non-urgent questions, you may also contact your provider using MyChart. We now offer e-Visits for anyone 18 and older to request care online for non-urgent symptoms. For details visit [email or web address].   Also download the MyChart app! Go to the app store, search "MyChart", open the app, select [facility], and log in with your MyChart username and password.  Due to Covid, a mask is required upon entering the hospital/clinic. If you do not have a mask, one will be given to you upon arrival. For doctor visits, patients may have 1 support person aged 18 or older with them. For treatment visits, patients cannot have anyone with them due to current Covid guidelines and our immunocompromised population.   

## 2021-11-30 NOTE — Progress Notes (Signed)
? ?  ?Palliative Medicine ?[place]  ?[phone] [phone] ? ? ?Name: [name] ?Date: 11/30/2021 ?MRN: [id number]  ?DOB: 11-21-1955 ? ?Patient Care Team: ?[name], MD as PCP - General (Family Medicine) ?[name], MD as PCP - Cardiology (Cardiology) ?Garner Nash, DO as Consulting Physician (Pulmonary Disease) ?Valrie Hart, RN as Oncology Nurse Navigator (Oncology) ?Maryanna Shape, NP as Nurse Practitioner (Oncology) ?Curt Bears, MD as Consulting Physician (Oncology) ?Pickenpack-Cousar, Carlena Sax, NP as Nurse Practitioner (Nurse Practitioner)  ? ? ?INTERVAL HISTORY: ?[name] is a 66 y.o. 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 He was admitted on 08/29/2021 and 09/15/21 from home with uncontrolled intractable chest wall pain. He has completed all of his radiation treatments. Now with plans to undergo immunotherapy as recommended by [facility].  ? ?SOCIAL HISTORY:    ? reports that he quit smoking about 6 years ago. His smoking use included cigarettes. He has a 33.00 pack-year smoking history. He has never used smokeless tobacco. He reports that he does not currently use alcohol. He reports that he does not use drugs. ? ?ADVANCE DIRECTIVES:  ?None on file  ? ?CODE STATUS:  ? ?PAST MEDICAL HISTORY: ?Past Medical History:  ?Diagnosis Date  ? Anemia   ? Anxiety   ? Arthritis   ? Basal cell carcinoma (BCC) of forehead   ? CAD (coronary artery disease)   ? a. 10/2015 ant STEMI >> LHC with 3 v CAD; oLAD tx with POBA >> emergent CABG. b. Multiple evals since that time,  early graft failure of SVG-RCA by cath 03/2016. c. 2/19 PCI/DES x1 to pRCA, normal EF.  ? Carotid artery disease ([place])   ? a. 40-59% BICA 02/2018.  ? Depression   ? Dyspnea   ? Ectopic atrial tachycardia ([place])   ? Esophageal reflux   ? eosinophil esophagitis  ? Family history of adverse reaction to anesthesia   ? "sister has PONV" (06/21/2017)  ? Former tobacco use   ? Gout   ? Hepatitis C   ? "treated and cured" (06/21/2017)  ? High cholesterol   ? History of blood transfusion   ? History of kidney stones   ? Hypertension   ? Ischemic cardiomyopathy   ? a. EF 25-30% at intraop TEE 4/17  //  b. Limited Echo 5/17 - EF 45-50%, mild ant HK. c. EF 55-65% by cath 09/2017.  ? Migraine   ? "3-4/yr" (06/21/2017)  ? Myocardial infarction ([place]) 10/2015  ? Palpitations   ? Pneumonia   ? Sinus bradycardia   ? a. HR dropping into 40s in 02/2016 -> BB reduced.  ? Stroke [facility]) 10/2016  ? "small one; sometimes my memory/cognitive issues" (06/21/2017)  ? Symptomatic hypotension   ? a. 02/2016 ER visit -> meds reduced.  ? Syncope   ? Wears dentures   ? Wears glasses   ? ? ?ALLERGIES:  is allergic to prednisone, tetanus toxoids, wellbutrin [bupropion], and varenicline. ? ?MEDICATIONS:  ?Current Outpatient Medications  ?Medication Sig Dispense Refill  ? acetaminophen (TYLENOL) 500 MG tablet Take 1,000 mg by mouth every 6 (six) hours as needed for mild pain, fever or  headache.    ? aspirin EC 81 MG tablet Take 81 mg by mouth daily. Swallow whole.    ? colchicine 0.6 MG tablet Take by mouth. (Patient not taking: Reported on 11/30/2021)    ? dexamethasone (DECADRON) 4 MG tablet Take one tablet PO daily x3 days, then 1/2 tablet PO daily; last dose on 12/04/21 9 tablet 0  ? diclofenac (VOLTAREN) 25 MG EC tablet Take by mouth.    ? docusate sodium (COLACE) 100 MG capsule Take 100-200 mg by mouth daily. (Patient not taking: Reported on 11/27/2021)    ? DULoxetine (CYMBALTA) 30 MG capsule Take 1 capsule (30 mg total) by mouth 2 (two) times daily. 60  capsule 1  ? ferrous gluconate (FERGON) 324 MG tablet Take by mouth daily.    ? folic acid (FOLVITE) 1 MG tablet Take 1 tablet (1 mg total) by mouth daily. 30 tablet 4  ? gabapentin (NEURONTIN) 300 MG capsule Take 2 capsules (600 mg total) by mouth 3 (three) times daily. 180 capsule 2  ? HYDROmorphone (DILAUDID) 8 MG tablet Take 1 tablet (8 mg total) by mouth every 3 (three) hours as needed for severe pain. 120 tablet 0  ? lidocaine (LIDODERM) 5 % Place 1 patch onto the skin daily as needed (for pain- Remove & Discard patch within 12 hours or as directed by MD).    ? lidocaine-prilocaine (EMLA) cream Apply to the Port-A-Cath site 30-60-minute before treatment 30 g 0  ? magnesium oxide (MAG-OX) 400 (240 Mg) MG tablet Take 1 tablet (400 mg total) by mouth daily. 30 tablet 1  ? methylPREDNISolone (MEDROL DOSEPAK) 4 MG TBPK tablet See admin instructions. follow package directions    ? metoprolol succinate (TOPROL-XL) 25 MG 24 hr tablet Take 1 tablet (25 mg total) by mouth daily. (Patient taking differently: Take 25 mg by mouth every evening.) 90 tablet 3  ? midodrine (PROAMATINE) 2.5 MG tablet Take 2.5 mg by mouth 3 (three) times daily as needed (AS DIRECTED).    ? morphine (MS CONTIN) 60 MG 12 hr tablet Take 1 tablet (60 mg total) by mouth every 8 (eight) hours. 90 tablet 0  ? Multiple Vitamin (MULTIVITAMIN WITH MINERALS) TABS tablet Take 1 tablet by mouth daily.    ? ondansetron (ZOFRAN) 8 MG tablet Take 1 tablet (8 mg total) by mouth every 8 (eight) hours as needed for nausea or vomiting. Starting 3 days after chemotherapy 90 tablet 2  ? pantoprazole (PROTONIX) 40 MG tablet Take 1 tablet by mouth daily at 6 AM. 30 tablet 1  ? polyethylene glycol (MIRALAX / GLYCOLAX) 17 g packet Take 17 g by mouth daily. (Patient not taking: Reported on 11/27/2021) 14 each 0  ? prochlorperazine (COMPAZINE) 10 MG tablet Take 1 tablet (10 mg total) by mouth every 6 (six) hours as needed for nausea or vomiting. 30 tablet 0  ? ranolazine  (RANEXA) 1000 MG SR tablet Take 1 tablet (1,000 mg total) by mouth 2 (two) times daily. 180 tablet 3  ? senna-docusate (SENOKOT-S) 8.6-50 MG tablet Take 1 tablet by mouth 2 (two) times daily.    ? tiZANidine (ZANAFLEX) 2 MG tablet Take 1 tablet (2 mg total) by mouth at bedtime as needed for muscle spasms. 30 tablet 2  ? XARELTO 20 MG TABS tablet Take 20 mg by mouth every evening.    ? ?No current facility-administered medications for this visit.  ? ?Facility-Administered Medications Ordered in Other Visits  ?Medication Dose Route Frequency Provider Last Rate Last  Admin  ? prochlorperazine (COMPAZINE) tablet 10 mg  10 mg Oral Q6H PRN Curt Bears, MD   10 mg at 11/30/21 1117  ? sodium chloride flush (NS) 0.9 % injection 10 mL  10 mL Intracatheter PRN Curt Bears, MD   10 mL at 11/30/21 1230  ? ? ?VITAL SIGNS: ?There were no vitals taken for this visit. ?There were no vitals filed for this visit. ?  ?Estimated body mass index is 27.62 kg/m? as calculated from the following: ?  Height as of an earlier encounter on 11/30/21: 5\' 9"  (1.753 m). ?  Weight as of an earlier encounter on 11/30/21: 187 lb (84.8 kg). ? ? ?PERFORMANCE STATUS (ECOG) : 1 - Symptomatic but completely ambulatory ? ? ?Physical Exam ?General: NAD ?Pulmonary: normal breathing pattern  ?Cardio: RRR, left ankle edema  ?Neurological: AAOx 4, appears comfortable ? ?IMPRESSION: ? ?I saw Mr. Bonelli during his infusion for symptom management follow-up. Continues to feel well. Shares he was doing some desk work and leaned over and thinks he bumped or somehow irritated his right side which is causing some increased discomfort. Overall stable and doing well on current regimen. ? ?Neoplasm related pain ?Pain is well controlled on current regimen. He is not having to take breakthrough medications around the clock. MS Contin 60 mg, Dilaudid 6-8 mg, gabapentin 300 mg, and Cymbalta 30 mg. No adjustments needed at this time, will continue with regimen given it is  effective.  ? ?Unfortunately given his extensive disease this will be ongoing and realistic expectations are to gain somewhat better control with awareness pain will not completely resolve. If pain furth

## 2021-11-30 NOTE — Progress Notes (Signed)
Nutrition Follow-up: ? ?Patient with stage IV large cell neuroendocrine with metastatic brain lesion, new bilateral lung nodules.  Patient receiving almita and [place]. ? ?Met with patient today during infusion.  Reports that his appetite has been good due to being on steroids.  Says that he is having some swelling in his ankles and legs (more on left).  Says that about 2 weeks ago was seen at urgent care due to treatment for gout.  Says that left foot was really swollen then and it has gone down with treatment.  No longer on treatment for gout (not painful).  Says that he has shortness of breath going up stairs.  Says that he forgot to tell Cassie, PA today during visit ? ? ? ?Medications: reviewed ? ?Labs: reviewed ? ?Anthropometrics:  ? ?Weight 187 lb today ? ?180 lb 3/31 ?173 lb 6.4 oz on 3/13 ?179 lb 3/8 ?169 lb on 2/21 ? ? ?NUTRITION DIAGNOSIS: Unintentional weight loss improve ? ? ?INTERVENTION:  ?Spoke with Cassie, PA regarding swelling and she will see in infusion.  ?Encouraged patient to be mindful of salty foods with swelling.   ? ?  ? ?MONITORING, EVALUATION, GOAL: weight trends, intake ? ? ?NEXT VISIT: Monday, May 15 during infusion.   ? ?Leslee Haueter B. Zenia Resides, RD, LDN ?Registered Dietitian ?[phone] ? ? ?

## 2021-11-30 NOTE — Telephone Encounter (Signed)
Left a detailed message for [name] about his upcoming Brain MRI and telephone follow-up with Shona Simpson PA-C, in May.  ? ?I included my contact information for him to call back if there are questions or conflicts.  ? ?Mont Dutton R.T.(R)(T) ?Radiation Special Procedures Navigator  ?

## 2021-11-30 NOTE — Progress Notes (Signed)
Order entered for port access the day of brain MRI at [facility].  ? ?Mont Dutton R.T.(R)(T) ?Radiation Special Procedures Navigator ?

## 2021-12-01 ENCOUNTER — Telehealth: Payer: Self-pay

## 2021-12-01 NOTE — Telephone Encounter (Signed)
Pt called stating he has a temperature of 100.7. ? ?Discussed with Dr. [name] who advised for pt to get COVID/Flue tested, take Tylenol for the temp and to please contact us with any changes. ?

## 2021-12-03 ENCOUNTER — Other Ambulatory Visit: Payer: Self-pay

## 2021-12-03 DIAGNOSIS — G893 Neoplasm related pain (acute) (chronic): Secondary | ICD-10-CM

## 2021-12-03 MED ORDER — TIZANIDINE HCL 2 MG PO TABS: 2.0000 mg | ORAL_TABLET | Freq: Every evening | ORAL | 2 refills | Status: DC | PRN

## 2021-12-03 NOTE — Progress Notes (Signed)
[name] called our office to ask for a refill on his Zanaflex and for the prescription to be sent to the [facility] in [place]. Prescription refilled and sent to [facility].  ?

## 2021-12-04 ENCOUNTER — Ambulatory Visit
Admission: RE | Admit: 2021-12-04 | Discharge: 2021-12-04 | Disposition: A | Discharge status: Home / Self Care | Payer: Medicare Other | Source: Ambulatory Visit | Attending: Internal Medicine | Admitting: Internal Medicine

## 2021-12-04 ENCOUNTER — Encounter: Payer: Self-pay | Admitting: Internal Medicine

## 2021-12-04 ENCOUNTER — Encounter: Payer: Self-pay | Admitting: Physician Assistant

## 2021-12-04 ENCOUNTER — Other Ambulatory Visit: Payer: Self-pay | Admitting: Nurse Practitioner

## 2021-12-04 DIAGNOSIS — C3490 Malignant neoplasm of unspecified part of unspecified bronchus or lung: Secondary | ICD-10-CM

## 2021-12-04 DIAGNOSIS — C7931 Secondary malignant neoplasm of brain: Secondary | ICD-10-CM

## 2021-12-04 MED ORDER — HEPARIN SOD (PORK) LOCK FLUSH 100 UNIT/ML IV SOLN
500.0000 [IU] | Freq: Once | INTRAVENOUS | Status: AC
  Administered 2021-12-04: 500 [IU] via INTRAVENOUS

## 2021-12-04 MED ORDER — HYDROMORPHONE HCL 8 MG PO TABS: 8.0000 mg | ORAL_TABLET | ORAL | 0 refills | Status: DC | PRN

## 2021-12-04 MED ORDER — SODIUM CHLORIDE 0.9% FLUSH
10.0000 mL | INTRAVENOUS | Status: DC | PRN
  Administered 2021-12-04: 10 mL via INTRAVENOUS

## 2021-12-04 MED ORDER — DULOXETINE HCL 30 MG PO CPEP: 30.0000 mg | ORAL_CAPSULE | Freq: Two times a day (BID) | ORAL | 1 refills | Status: DC

## 2021-12-04 MED ORDER — IOPAMIDOL (ISOVUE-300) INJECTION 61%
100.0000 mL | Freq: Once | INTRAVENOUS | Status: AC | PRN
  Administered 2021-12-04: 100 mL via INTRAVENOUS

## 2021-12-09 ENCOUNTER — Other Ambulatory Visit: Payer: Self-pay | Admitting: Physician Assistant

## 2021-12-17 ENCOUNTER — Encounter (HOSPITAL_BASED_OUTPATIENT_CLINIC_OR_DEPARTMENT_OTHER): Payer: Self-pay | Admitting: Emergency Medicine

## 2021-12-17 ENCOUNTER — Emergency Department (HOSPITAL_BASED_OUTPATIENT_CLINIC_OR_DEPARTMENT_OTHER)
Admission: EM | Admit: 2021-12-17 | Discharge: 2021-12-17 | Disposition: A | Discharge status: Home / Self Care | Payer: Medicare Other | Attending: Emergency Medicine | Admitting: Emergency Medicine

## 2021-12-17 ENCOUNTER — Other Ambulatory Visit: Payer: Self-pay

## 2021-12-17 ENCOUNTER — Other Ambulatory Visit: Payer: Self-pay | Admitting: *Deleted

## 2021-12-17 ENCOUNTER — Telehealth: Payer: Self-pay | Admitting: Medical Oncology

## 2021-12-17 ENCOUNTER — Emergency Department (HOSPITAL_COMMUNITY): Payer: Medicare Other

## 2021-12-17 ENCOUNTER — Telehealth: Payer: Self-pay

## 2021-12-17 ENCOUNTER — Other Ambulatory Visit (HOSPITAL_COMMUNITY): Payer: Self-pay

## 2021-12-17 DIAGNOSIS — R0602 Shortness of breath: Secondary | ICD-10-CM

## 2021-12-17 DIAGNOSIS — Z951 Presence of aortocoronary bypass graft: Secondary | ICD-10-CM | POA: Diagnosis not present

## 2021-12-17 DIAGNOSIS — Z7982 Long term (current) use of aspirin: Secondary | ICD-10-CM | POA: Insufficient documentation

## 2021-12-17 DIAGNOSIS — Z85118 Personal history of other malignant neoplasm of bronchus and lung: Secondary | ICD-10-CM | POA: Insufficient documentation

## 2021-12-17 DIAGNOSIS — R109 Unspecified abdominal pain: Secondary | ICD-10-CM | POA: Diagnosis present

## 2021-12-17 DIAGNOSIS — C7989 Secondary malignant neoplasm of other specified sites: Secondary | ICD-10-CM | POA: Diagnosis not present

## 2021-12-17 DIAGNOSIS — I2510 Atherosclerotic heart disease of native coronary artery without angina pectoris: Secondary | ICD-10-CM | POA: Diagnosis not present

## 2021-12-17 DIAGNOSIS — N183 Chronic kidney disease, stage 3 unspecified: Secondary | ICD-10-CM | POA: Insufficient documentation

## 2021-12-17 DIAGNOSIS — Z85841 Personal history of malignant neoplasm of brain: Secondary | ICD-10-CM | POA: Insufficient documentation

## 2021-12-17 DIAGNOSIS — Z7901 Long term (current) use of anticoagulants: Secondary | ICD-10-CM | POA: Insufficient documentation

## 2021-12-17 LAB — COMPREHENSIVE METABOLIC PANEL
ALT: 35 U/L (ref 0–44)
AST: 41 U/L (ref 15–41)
Albumin: 3.8 g/dL (ref 3.5–5.0)
Alkaline Phosphatase: 134 U/L — ABNORMAL HIGH (ref 38–126)
Anion gap: 11 (ref 5–15)
BUN: 14 mg/dL (ref 8–23)
CO2: 25 mmol/L (ref 22–32)
Calcium: 9.9 mg/dL (ref 8.9–10.3)
Chloride: 99 mmol/L (ref 98–111)
Creatinine, Ser: 0.98 mg/dL (ref 0.61–1.24)
GFR, Estimated: >60 mL/min (ref >60)
Glucose, Bld: 104 mg/dL — ABNORMAL HIGH (ref 70–99)
Potassium: 4.2 mmol/L (ref 3.5–5.1)
Sodium: 135 mmol/L (ref 135–145)
Total Bilirubin: 0.7 mg/dL (ref 0.3–1.2)
Total Protein: 8.2 g/dL — ABNORMAL HIGH (ref 6.5–8.1)

## 2021-12-17 LAB — URINALYSIS, ROUTINE W REFLEX MICROSCOPIC
Bilirubin Urine: NEGATIVE
Glucose, UA: NEGATIVE mg/dL
Hgb urine dipstick: NEGATIVE
Ketones, ur: NEGATIVE mg/dL
Leukocytes,Ua: NEGATIVE
Nitrite: NEGATIVE
Protein, ur: NEGATIVE mg/dL
Specific Gravity, Urine: 1.005 (ref 1.005–1.030)
pH: 8 (ref 5.0–8.0)

## 2021-12-17 LAB — CBC
HCT: 37.5 % — ABNORMAL LOW (ref 39.0–52.0)
Hemoglobin: 12.5 g/dL — ABNORMAL LOW (ref 13.0–17.0)
MCH: 32.4 pg (ref 26.0–34.0)
MCHC: 33.3 g/dL (ref 30.0–36.0)
MCV: 97.2 fL (ref 80.0–100.0)
Platelets: 310 10*3/uL (ref 150–400)
RBC: 3.86 MIL/uL — ABNORMAL LOW (ref 4.22–5.81)
RDW: 17.3 % — ABNORMAL HIGH (ref 11.5–15.5)
WBC: 7.5 10*3/uL (ref 4.0–10.5)
nRBC: 0 % (ref 0.0–0.2)

## 2021-12-17 LAB — LIPASE, BLOOD: Lipase: 24 U/L (ref 11–51)

## 2021-12-17 MED ORDER — HYDROMORPHONE HCL 1 MG/ML IJ SOLN
1.0000 mg | Freq: Once | INTRAMUSCULAR | Status: AC
  Administered 2021-12-17: 1 mg via INTRAVENOUS
  Filled 2021-12-17: qty 1

## 2021-12-17 MED ORDER — HYDROMORPHONE HCL 1 MG/ML IJ SOLN
2.0000 mg | Freq: Once | INTRAMUSCULAR | Status: AC
  Administered 2021-12-17: 2 mg via INTRAVENOUS
  Filled 2021-12-17: qty 2

## 2021-12-17 MED ORDER — IOHEXOL 350 MG/ML SOLN
100.0000 mL | Freq: Once | INTRAVENOUS | Status: AC | PRN
  Administered 2021-12-17: 100 mL via INTRAVENOUS

## 2021-12-17 MED ORDER — PROCHLORPERAZINE EDISYLATE 10 MG/2ML IJ SOLN
10.0000 mg | Freq: Once | INTRAMUSCULAR | Status: AC
  Administered 2021-12-17: 10 mg via INTRAVENOUS
  Filled 2021-12-17: qty 2

## 2021-12-17 MED ORDER — ONDANSETRON HCL 4 MG/2ML IJ SOLN
4.0000 mg | Freq: Once | INTRAMUSCULAR | Status: AC
  Administered 2021-12-17: 4 mg via INTRAVENOUS
  Filled 2021-12-17: qty 2

## 2021-12-17 NOTE — ED Notes (Signed)
Pt. Made aware for the need of urine specimen. 

## 2021-12-17 NOTE — Telephone Encounter (Signed)
Symptomatic -x 2 days -alimta /Keytruda 2 weeks ago.  ? ?"I am not doing good at all. I feel like I have the flu". Denies fever/ diarrhea/ body aches. ?.  ?Positive for "vomiting /dizzy,weak , ankle swelling, no appetite". ?He states he has vomited about 1/2 cup " bile off and on for the past 2 days"-not associated with eating . " I have no food in me".  ? ?Took zofran 2 hours ago . And has not vomited since then.  ? ? His  chest hurts -same pain as in past but " more noticeable " . He took Dilaudid 1/2 hour ago and his pain is a little better.  ? ?Per [name] , appt with [facility]. ?Schedule message sent. ? ?

## 2021-12-17 NOTE — Telephone Encounter (Signed)
This RN called patient regarding his current symptoms of vomiting, dizziness, weakness, ankle swelling and lack of appetite. Per patient, these symptoms have been ongoing X2 weeks since his chemo infusion. Patient was educated on what [facility] is able to provide and he was encouraged to go the ER should his symptoms worsen. Ultimately, patient decided to think about his choices and will call back if wanting an appointment at [facility]. Patient has appointment scheduled with his oncologist next week and was encouraged to call the cancer center with any further questions or concerns. Verline Lema, [place], made aware of this phone call.  ?

## 2021-12-17 NOTE — ED Triage Notes (Signed)
Pt arrives pov, slow gait to triage, reports "problems with chemo". Reports bilateral lower extremity swelling, abdominal pain and n/v x 2 weeks ?

## 2021-12-17 NOTE — ED Notes (Signed)
Discharge instructions reviewed, no questions Pt states understanding to foloow up with oncologist and return for worsening of symptoms. Pt ambulatory with steady gait upon discharge. No s/s of distress noted. ?

## 2021-12-17 NOTE — ED Notes (Signed)
Called to patient room. Pt states "I feel weird. It feels different in my head." Roemhildt, Lorin T, PA-C made aware of what pt stated. Roemhildt, Lorin T, PA-C headed to the bedside. ?

## 2021-12-17 NOTE — ED Notes (Signed)
Per Roemhildt, Lorin T, PA-C pt can leave with his wife to travel to [place] . ?

## 2021-12-17 NOTE — ED Provider Notes (Signed)
Patient transferred from [facility] for CT scans.  CT images viewed by myself.  No PE noted.  No significant change from his most recent CT a few weeks ago.  Symptoms could be related to his recent chemotherapy.  We will send a message to his oncologist but otherwise he appears stable for discharge. ?  ?Sherwood Gambler, MD ?12/17/21 2126 ? ?

## 2021-12-17 NOTE — ED Notes (Signed)
Explained to pt to go to [facility] ER and check in there for his CT Scans. Pt given transfer paperwork. Explained to pt to not open paperwork and to give ER staff. Pt explained that he would travel with an IV still in his arm for the CT scan.  ?

## 2021-12-17 NOTE — ED Provider Notes (Signed)
?[place] EMERGENCY DEPARTMENT ?Provider Note ? ? ?CSN: [id number] ?Arrival date & time: 12/17/21  1457 ? ?  ? ?History ? ?Chief Complaint  ?Patient presents with  ? Abdominal Pain  ? ? ?[name] is a 66 y.o. male with history of large cell lung cancer, secondary malignant neoplasm of brain, and neuroendocrine carcinoma who presents to the emergency for 2 weeks of abdominal pain, nausea, vomiting, and bilateral lower extremity.  Patient states that he recently started a new chemotherapy regimen. ? ? ?Abdominal Pain ?Associated symptoms: chest pain, nausea, shortness of breath and vomiting   ? ?  ? ?Home Medications ?Prior to Admission medications   ?Medication Sig Start Date End Date Taking? Authorizing Provider  ?acetaminophen (TYLENOL) 500 MG tablet Take 1,000 mg by mouth every 6 (six) hours as needed for mild pain, fever or headache.    [provider]  ?aspirin EC 81 MG tablet Take 81 mg by mouth daily. Swallow whole.    [provider]  ?colchicine 0.6 MG tablet Take by mouth. ?Patient not taking: Reported on 11/30/2021 11/17/21   [provider]  ?dexamethasone (DECADRON) 4 MG tablet Take one tablet PO daily x3 days, then 1/2 tablet PO daily; last dose on 12/04/21 11/24/21   Hayden Pedro, PA-C  ?diclofenac (VOLTAREN) 25 MG EC tablet Take by mouth. 11/01/21   [provider]  ?docusate sodium (COLACE) 100 MG capsule Take 100-200 mg by mouth daily. ?Patient not taking: Reported on 11/27/2021    [provider]  ?DULoxetine (CYMBALTA) 30 MG capsule Take 1 capsule (30 mg total) by mouth 2 (two) times daily. 12/04/21   Pickenpack-Cousar, Carlena Sax, NP  ?ferrous gluconate (FERGON) 324 MG tablet Take by mouth daily. 10/28/21   [provider]  ?folic acid (FOLVITE) 1 MG tablet Take 1 tablet (1 mg total) by mouth daily. 09/17/21   Curt Bears, MD  ?gabapentin (NEURONTIN) 300 MG capsule Take 2 capsules (600 mg total) by mouth 3 (three) times  daily. 11/23/21   Pickenpack-Cousar, Carlena Sax, NP  ?HYDROmorphone (DILAUDID) 8 MG tablet Take 1 tablet (8 mg total) by mouth every 3 (three) hours as needed for severe pain. 12/08/21   Pickenpack-Cousar, Carlena Sax, NP  ?lidocaine (LIDODERM) 5 % Place 1 patch onto the skin daily as needed (for pain- Remove & Discard patch within 12 hours or as directed by MD).    [provider]  ?lidocaine-prilocaine (EMLA) cream Apply to the Port-A-Cath site 30-60-minute before treatment 11/09/21   Curt Bears, MD  ?magnesium oxide (MAG-OX) 400 (240 Mg) MG tablet TAKE 1 TABLET(400 MG) BY MOUTH DAILY 12/09/21   Heilingoetter, Cassandra L, PA-C  ?methylPREDNISolone (MEDROL DOSEPAK) 4 MG TBPK tablet See admin instructions. follow package directions 11/17/21   [provider]  ?metoprolol succinate (TOPROL-XL) 25 MG 24 hr tablet Take 1 tablet (25 mg total) by mouth daily. ?Patient taking differently: Take 25 mg by mouth every evening. 08/27/20   [name], MD  ?midodrine (PROAMATINE) 2.5 MG tablet Take 2.5 mg by mouth 3 (three) times daily as needed (AS DIRECTED).    [provider]  ?morphine (MS CONTIN) 60 MG 12 hr tablet Take 1 tablet (60 mg total) by mouth every 8 (eight) hours. 11/30/21   Pickenpack-Cousar, Carlena Sax, NP  ?Multiple Vitamin (MULTIVITAMIN WITH MINERALS) TABS tablet Take 1 tablet by mouth daily.    [provider]  ?ondansetron (ZOFRAN) 8 MG tablet Take 1 tablet (8 mg total) by  mouth every 8 (eight) hours as needed for nausea or vomiting. Starting 3 days after chemotherapy 08/27/21   Pickenpack-Cousar, Carlena Sax, NP  ?pantoprazole (PROTONIX) 40 MG tablet Take 1 tablet by mouth daily at 6 AM. 10/07/21   Eugenie Filler, MD  ?polyethylene glycol (MIRALAX / GLYCOLAX) 17 g packet Take 17 g by mouth daily. ?Patient not taking: Reported on 11/27/2021 09/04/21   Mariel Aloe, MD  ?prochlorperazine (COMPAZINE) 10 MG tablet Take 1 tablet (10 mg total) by mouth every 6 (six) hours as  needed for nausea or vomiting. 09/29/21   Curt Bears, MD  ?ranolazine (RANEXA) 1000 MG SR tablet Take 1 tablet (1,000 mg total) by mouth 2 (two) times daily. 05/25/21   [name], MD  ?senna-docusate (SENOKOT-S) 8.6-50 MG tablet Take 1 tablet by mouth 2 (two) times daily. 10/06/21   Eugenie Filler, MD  ?tiZANidine (ZANAFLEX) 2 MG tablet Take 1 tablet (2 mg total) by mouth at bedtime as needed for muscle spasms. 12/03/21   Pickenpack-Cousar, Carlena Sax, NP  ?XARELTO 20 MG TABS tablet Take 20 mg by mouth every evening. 06/01/21   [provider]  ?   ? ?Allergies    ?Prednisone, Tetanus toxoids, Wellbutrin [bupropion], and Varenicline   ? ?Review of Systems   ?Review of Systems  ?Respiratory:  Positive for shortness of breath.   ?Cardiovascular:  Positive for chest pain and leg swelling.  ?Gastrointestinal:  Positive for abdominal pain, nausea and vomiting.  ?All other systems reviewed and are negative. ? ?Physical Exam ?Updated Vital Signs ?BP 129/72   Pulse 70   Temp 98.7 ?F (37.1 ?C) (Oral)   Resp 15   Ht 5\' 9"  (1.753 m)   Wt 77.1 kg   SpO2 99%   BMI 25.10 kg/m?  ?Physical Exam ?Vitals and nursing note reviewed.  ?Constitutional:   ?   Appearance: Normal appearance.  ?HENT:  ?   Head: Normocephalic and atraumatic.  ?Eyes:  ?   Conjunctiva/sclera: Conjunctivae normal.  ?Cardiovascular:  ?   Rate and Rhythm: Normal rate and regular rhythm.  ?   Pulses:     ?     Dorsalis pedis pulses are 2+ on the right side and 2+ on the left side.  ?   Comments: Non-pitting edema of the left ankle with overlying erythema consistent with gout. No edema of the right lower leg.  ?Pulmonary:  ?   Effort: Pulmonary effort is normal. No respiratory distress.  ?   Breath sounds: Normal breath sounds.  ?Abdominal:  ?   General: There is no distension.  ?   Palpations: Abdomen is soft.  ?   Tenderness: There is no abdominal tenderness.  ?Skin: ?   General: Skin is warm and dry.  ?Neurological:  ?   General: No  focal deficit present.  ?   Mental Status: He is alert.  ? ? ?ED Results / Procedures / Treatments   ?Labs ?(all labs ordered are listed, but only abnormal results are displayed) ?Labs Reviewed  ?COMPREHENSIVE METABOLIC PANEL - Abnormal; Notable for the following components:  ?    Result Value  ? Glucose, Bld 104 (*)   ? Total Protein 8.2 (*)   ? Alkaline Phosphatase 134 (*)   ? All other components within normal limits  ?CBC - Abnormal; Notable for the following components:  ? RBC 3.86 (*)   ? Hemoglobin 12.5 (*)   ? HCT 37.5 (*)   ? RDW 17.3 (*)   ?  All other components within normal limits  ?LIPASE, BLOOD  ?URINALYSIS, ROUTINE W REFLEX MICROSCOPIC  ? ? ?EKG ?EKG Interpretation ? ?Date/Time:  Thursday December 17 2021 15:26:08 EDT ?Ventricular Rate:  112 ?PR Interval:  136 ?QRS Duration: 80 ?QT Interval:  328 ?QTC Calculation: 447 ?R Axis:   87 ?Text Interpretation: Sinus tachycardia Septal infarct , age undetermined Abnormal ECG When compared with ECG of 02-Oct-2021 19:38, Since last tracing rate faster Confirmed by Wandra Arthurs [id number]) on 12/17/2021 5:06:25 PM ? ?Radiology ?No results found. ? ?Procedures ?Procedures  ? ? ?Medications Ordered in ED ?Medications  ?HYDROmorphone (DILAUDID) injection 1 mg (1 mg Intravenous Given 12/17/21 1638)  ?ondansetron [facility]) injection 4 mg (4 mg Intravenous Given 12/17/21 1637)  ?HYDROmorphone (DILAUDID) injection 2 mg (2 mg Intravenous Given 12/17/21 1730)  ?prochlorperazine (COMPAZINE) injection 10 mg (10 mg Intravenous Given 12/17/21 1730)  ?iohexol (OMNIPAQUE) 350 MG/ML injection 100 mL (100 mLs Intravenous Contrast Given 12/17/21 1851)  ? ? ?ED Course/ Medical Decision Making/ A&P ?  ?                        ?Medical Decision Making ?Amount and/or Complexity of Data Reviewed ?Labs: ordered. ?Radiology: ordered. ? ?Risk ?Prescription drug management. ? ? ?This patient presents to the ED for concern of abdominal pain, this involves an extensive number of treatment options, and is  a complaint that carries with it a high risk of complications and morbidity. The emergent differential diagnosis prior to evaluation includes, but is not limited to,  AAA, mesenteric ischemia, appendicitis, divert

## 2021-12-18 ENCOUNTER — Telehealth: Payer: Self-pay | Admitting: Internal Medicine

## 2021-12-18 NOTE — Telephone Encounter (Signed)
Called patient regarding UPCOMING APPOINTMENT, LEFT A VOICEMAIL. ?

## 2021-12-21 ENCOUNTER — Inpatient Hospital Stay (HOSPITAL_BASED_OUTPATIENT_CLINIC_OR_DEPARTMENT_OTHER): Payer: Medicare Other | Admitting: Nurse Practitioner

## 2021-12-21 ENCOUNTER — Encounter: Payer: Self-pay | Admitting: Nurse Practitioner

## 2021-12-21 ENCOUNTER — Inpatient Hospital Stay (HOSPITAL_BASED_OUTPATIENT_CLINIC_OR_DEPARTMENT_OTHER): Payer: Medicare Other | Admitting: Internal Medicine

## 2021-12-21 ENCOUNTER — Inpatient Hospital Stay: Payer: Medicare Other

## 2021-12-21 ENCOUNTER — Encounter: Payer: Self-pay | Admitting: Internal Medicine

## 2021-12-21 ENCOUNTER — Encounter: Payer: Self-pay | Admitting: *Deleted

## 2021-12-21 ENCOUNTER — Other Ambulatory Visit: Payer: Self-pay

## 2021-12-21 VITALS — BP 115/80 | HR 109 | Temp 98.5°F | Resp 17 | Wt 168.4 lb

## 2021-12-21 VITALS — BP 128/86 | HR 93 | Temp 98.3°F | Resp 17

## 2021-12-21 DIAGNOSIS — Z5112 Encounter for antineoplastic immunotherapy: Secondary | ICD-10-CM

## 2021-12-21 DIAGNOSIS — C7931 Secondary malignant neoplasm of brain: Secondary | ICD-10-CM

## 2021-12-21 DIAGNOSIS — C7A1 Malignant poorly differentiated neuroendocrine tumors: Secondary | ICD-10-CM

## 2021-12-21 DIAGNOSIS — Z95828 Presence of other vascular implants and grafts: Secondary | ICD-10-CM

## 2021-12-21 DIAGNOSIS — C3491 Malignant neoplasm of unspecified part of right bronchus or lung: Secondary | ICD-10-CM

## 2021-12-21 DIAGNOSIS — R634 Abnormal weight loss: Secondary | ICD-10-CM

## 2021-12-21 DIAGNOSIS — R5382 Chronic fatigue, unspecified: Secondary | ICD-10-CM

## 2021-12-21 DIAGNOSIS — R530 Neoplastic (malignant) related fatigue: Secondary | ICD-10-CM

## 2021-12-21 DIAGNOSIS — K5903 Drug induced constipation: Secondary | ICD-10-CM

## 2021-12-21 DIAGNOSIS — Z5111 Encounter for antineoplastic chemotherapy: Secondary | ICD-10-CM

## 2021-12-21 DIAGNOSIS — G893 Neoplasm related pain (acute) (chronic): Secondary | ICD-10-CM

## 2021-12-21 DIAGNOSIS — R110 Nausea: Secondary | ICD-10-CM

## 2021-12-21 DIAGNOSIS — Z515 Encounter for palliative care: Secondary | ICD-10-CM

## 2021-12-21 LAB — CBC WITH DIFFERENTIAL (CANCER CENTER ONLY)
Abs Immature Granulocytes: 0.02 10*3/uL (ref 0.00–0.07)
Basophils Absolute: 0 10*3/uL (ref 0.0–0.1)
Basophils Relative: 1 %
Eosinophils Absolute: 0.1 10*3/uL (ref 0.0–0.5)
Eosinophils Relative: 1 %
HCT: 35.2 % — ABNORMAL LOW (ref 39.0–52.0)
Hemoglobin: 11.9 g/dL — ABNORMAL LOW (ref 13.0–17.0)
Immature Granulocytes: 0 %
Lymphocytes Relative: 14 %
Lymphs Abs: 0.9 10*3/uL (ref 0.7–4.0)
MCH: 32.6 pg (ref 26.0–34.0)
MCHC: 33.8 g/dL (ref 30.0–36.0)
MCV: 96.4 fL (ref 80.0–100.0)
Monocytes Absolute: 0.9 10*3/uL (ref 0.1–1.0)
Monocytes Relative: 13 %
Neutro Abs: 4.7 10*3/uL (ref 1.7–7.7)
Neutrophils Relative %: 71 %
Platelet Count: 289 10*3/uL (ref 150–400)
RBC: 3.65 MIL/uL — ABNORMAL LOW (ref 4.22–5.81)
RDW: 16.6 % — ABNORMAL HIGH (ref 11.5–15.5)
WBC Count: 6.6 10*3/uL (ref 4.0–10.5)
nRBC: 0 % (ref 0.0–0.2)

## 2021-12-21 LAB — CMP (CANCER CENTER ONLY)
ALT: 23 U/L (ref 0–44)
AST: 25 U/L (ref 15–41)
Albumin: 3.8 g/dL (ref 3.5–5.0)
Alkaline Phosphatase: 127 U/L — ABNORMAL HIGH (ref 38–126)
Anion gap: 5 (ref 5–15)
BUN: 17 mg/dL (ref 8–23)
CO2: 26 mmol/L (ref 22–32)
Calcium: 9.7 mg/dL (ref 8.9–10.3)
Chloride: 103 mmol/L (ref 98–111)
Creatinine: 0.99 mg/dL (ref 0.61–1.24)
GFR, Estimated: >60 mL/min (ref >60)
Glucose, Bld: 117 mg/dL — ABNORMAL HIGH (ref 70–99)
Potassium: 4 mmol/L (ref 3.5–5.1)
Sodium: 134 mmol/L — ABNORMAL LOW (ref 135–145)
Total Bilirubin: 0.5 mg/dL (ref 0.3–1.2)
Total Protein: 7.6 g/dL (ref 6.5–8.1)

## 2021-12-21 LAB — SAMPLE TO BLOOD BANK

## 2021-12-21 LAB — TSH: TSH: 0.81 u[IU]/mL (ref 0.350–4.500)

## 2021-12-21 LAB — MAGNESIUM: Magnesium: 2 mg/dL (ref 1.7–2.4)

## 2021-12-21 MED ORDER — HEPARIN SOD (PORK) LOCK FLUSH 100 UNIT/ML IV SOLN
500.0000 [IU] | Freq: Once | INTRAVENOUS | Status: AC | PRN
  Administered 2021-12-21: 500 [IU]

## 2021-12-21 MED ORDER — SODIUM CHLORIDE 0.9 % IV SOLN: Freq: Once | INTRAVENOUS | Status: AC

## 2021-12-21 MED ORDER — SODIUM CHLORIDE 0.9 % IV SOLN
200.0000 mg | Freq: Once | INTRAVENOUS | Status: AC
  Administered 2021-12-21: 200 mg via INTRAVENOUS
  Filled 2021-12-21: qty 8

## 2021-12-21 MED ORDER — HYDROMORPHONE HCL 4 MG PO TABS: 6.0000 mg | ORAL_TABLET | Freq: Four times a day (QID) | ORAL | 0 refills | Status: DC | PRN

## 2021-12-21 MED ORDER — PROCHLORPERAZINE MALEATE 10 MG PO TABS
10.0000 mg | ORAL_TABLET | Freq: Once | ORAL | Status: AC
  Administered 2021-12-21: 10 mg via ORAL
  Filled 2021-12-21: qty 1

## 2021-12-21 MED ORDER — SODIUM CHLORIDE 0.9 % IV SOLN: INTRAVENOUS | Status: AC

## 2021-12-21 MED ORDER — MORPHINE SULFATE ER 60 MG PO TBCR: 60.0000 mg | EXTENDED_RELEASE_TABLET | Freq: Two times a day (BID) | ORAL | 0 refills | Status: DC

## 2021-12-21 MED ORDER — SODIUM CHLORIDE 0.9% FLUSH
10.0000 mL | INTRAVENOUS | Status: DC | PRN
  Administered 2021-12-21: 10 mL

## 2021-12-21 MED ORDER — SODIUM CHLORIDE 0.9% FLUSH: 10.0000 mL | Freq: Once | INTRAVENOUS | Status: DC

## 2021-12-21 MED ORDER — GABAPENTIN 300 MG PO CAPS: 300.0000 mg | ORAL_CAPSULE | Freq: Three times a day (TID) | ORAL | 2 refills | Status: DC

## 2021-12-21 MED ORDER — SODIUM CHLORIDE 0.9 % IV SOLN
500.0000 mg/m2 | Freq: Once | INTRAVENOUS | Status: AC
  Administered 2021-12-21: 1000 mg via INTRAVENOUS
  Filled 2021-12-21: qty 40

## 2021-12-21 NOTE — Progress Notes (Signed)
Ok to treat-Per Dr. [name] ,it is ok to treat pt today with Alimta and Keytruda and heart rate of 109. ?

## 2021-12-21 NOTE — Patient Instructions (Addendum)
[facility]  Discharge Instructions: ?Thank you for choosing [facility] to provide your oncology and hematology care.  ? ?If you have a lab appointment with the [facility], please go directly to the [facility] and check in at the registration area. ?  ?Wear comfortable clothing and clothing appropriate for easy access to any Portacath or PICC line.  ? ?We strive to give you quality time with your provider. You may need to reschedule your appointment if you arrive late (15 or more minutes).  Arriving late affects you and other patients whose appointments are after yours.  Also, if you miss three or more appointments without notifying the office, you may be dismissed from the clinic at the provider?s discretion.    ?  ?For prescription refill requests, have your pharmacy contact our office and allow 72 hours for refills to be completed.   ? ?Today you received the following chemotherapy and/or immunotherapy agents: Keytruda and Alimta    ?  ?To help prevent nausea and vomiting after your treatment, we encourage you to take your nausea medication as directed. ? ?BELOW ARE SYMPTOMS THAT SHOULD BE REPORTED IMMEDIATELY: ?*FEVER GREATER THAN 100.4 F (38 ?C) OR HIGHER ?*CHILLS OR SWEATING ?*NAUSEA AND VOMITING THAT IS NOT CONTROLLED WITH YOUR NAUSEA MEDICATION ?*UNUSUAL SHORTNESS OF BREATH ?*UNUSUAL BRUISING OR BLEEDING ?*URINARY PROBLEMS (pain or burning when urinating, or frequent urination) ?*BOWEL PROBLEMS (unusual diarrhea, constipation, pain near the anus) ?TENDERNESS IN MOUTH AND THROAT WITH OR WITHOUT PRESENCE OF ULCERS (sore throat, sores in mouth, or a toothache) ?UNUSUAL RASH, SWELLING OR PAIN  ?UNUSUAL VAGINAL DISCHARGE OR ITCHING  ? ?Items with * indicate a potential emergency and should be followed up as soon as possible or go to the Emergency Department if any problems should occur. ? ?Please show the CHEMOTHERAPY ALERT CARD or IMMUNOTHERAPY ALERT CARD at  check-in to the Emergency Department and triage nurse. ? ?Should you have questions after your visit or need to cancel or reschedule your appointment, please contact [facility]  Dept: [phone]  and follow the prompts.  Office hours are 8:00 a.m. to 4:30 p.m. Monday - Friday. Please note that voicemails left after 4:00 p.m. may not be returned until the following business day.  We are closed weekends and major holidays. You have access to a nurse at all times for urgent questions. Please call the main number to the clinic Dept: [phone] and follow the prompts. ? ? ?For any non-urgent questions, you may also contact your provider using MyChart. We now offer e-Visits for anyone 81 and older to request care online for non-urgent symptoms. For details visit [email or web address]. ?  ?Also download the MyChart app! Go to the app store, search "MyChart", open the app, select [facility], and log in with your MyChart username and password. ? ?Due to Covid, a mask is required upon entering the hospital/clinic. If you do not have a mask, one will be given to you upon arrival. For doctor visits, patients may have 1 support person aged 61 or older with them. For treatment visits, patients cannot have anyone with them due to current Covid guidelines and our immunocompromised population.  ? ?Dehydration, Adult ?Dehydration is a condition in which there is not enough water or other fluids in the body. This happens when a person loses more fluids than he or she takes in. Important organs, such as the kidneys, brain, and heart, cannot function without a proper  amount of fluids. Any loss of fluids from the body can lead to dehydration. ?Dehydration can be mild, moderate, or severe. It should be treated right away to prevent it from becoming severe. ?What are the causes? ?Dehydration may be caused by: ?Conditions that cause loss of water or other fluids, such as diarrhea, vomiting, or sweating  or urinating a lot. ?Not drinking enough fluids, especially when you are ill or doing activities that require a lot of energy. ?Other illnesses and conditions, such as fever or infection. ?Certain medicines, such as medicines that remove excess fluid from the body (diuretics). ?Lack of safe drinking water. ?Not being able to get enough water and food. ?What increases the risk? ?The following factors may make you more likely to develop this condition: ?Having a long-term (chronic) illness that has not been treated properly, such as diabetes, heart disease, or kidney disease. ?Being 16 years of age or older. ?Having a disability. ?Living in a place that is high in altitude, where thinner, drier air causes more fluid loss. ?Doing exercises that put stress on your body for a long time (endurance sports). ?What are the signs or symptoms? ?Symptoms of dehydration depend on how severe it is. ?Mild or moderate dehydration ?Thirst. ?Dry lips or dry mouth. ?Dizziness or light-headedness, especially when standing up from a seated position. ?Muscle cramps. ?Dark urine. Urine may be the color of tea. ?Less urine or tears produced than usual. ?Headache. ?Severe dehydration ?Changes in skin. Your skin may be cold and clammy, blotchy, or pale. Your skin also may not return to normal after being lightly pinched and released. ?Little or no tears, urine, or sweat. ?Changes in vital signs, such as rapid breathing and low blood pressure. Your pulse may be weak or may be faster than 100 beats a minute when you are sitting still. ?Other changes, such as: ?Feeling very thirsty. ?Sunken eyes. ?Cold hands and feet. ?Confusion. ?Being very tired (lethargic) or having trouble waking from sleep. ?Short-term weight loss. ?Loss of consciousness. ?How is this diagnosed? ?This condition is diagnosed based on your symptoms and a physical exam. You may have blood and urine tests to help confirm the diagnosis. ?How is this treated? ?Treatment for this  condition depends on how severe it is. Treatment should be started right away. Do not wait until dehydration becomes severe. Severe dehydration is an emergency and needs to be treated in a hospital. ?Mild or moderate dehydration can be treated at home. You may be asked to: ?Drink more fluids. ?Drink an oral rehydration solution (ORS). This drink helps restore proper amounts of fluids and salts and minerals in the blood (electrolytes). ?Severe dehydration can be treated: ?With IV fluids. ?By correcting abnormal levels of electrolytes. This is often done by giving electrolytes through a tube that is passed through your nose and into your stomach (nasogastric tube, or NG tube). ?By treating the underlying cause of dehydration. ?Follow these instructions at home: ?Oral rehydration solution ?If told by your health care provider, drink an ORS: ?Make an ORS by following instructions on the package. ?Start by drinking small amounts, about ? cup (120 mL) every 5-10 minutes. ?Slowly increase how much you drink until you have taken the amount recommended by your health care provider. ?Eating and drinking ? ?  ? ?  ? ?Drink enough clear fluid to keep your urine pale yellow. If you were told to drink an ORS, finish the ORS first and then start slowly drinking other clear fluids. Drink fluids such as: ?  Water. Do not drink only water. Doing that can lead to hyponatremia, which is having too little salt (sodium) in the body. ?Water from ice chips you suck on. ?Fruit juice that you have added water to (diluted fruit juice). ?Low-calorie sports drinks. ?Eat foods that contain a healthy balance of electrolytes, such as bananas, oranges, potatoes, tomatoes, and spinach. ?Do not drink alcohol. ?Avoid the following: ?Drinks that contain a lot of sugar. These include high-calorie sports drinks, fruit juice that is not diluted, and soda. ?Caffeine. ?Foods that are greasy or contain a lot of fat or sugar. ?General instructions ?Take  over-the-counter and prescription medicines only as told by your health care provider. ?Do not take sodium tablets. Doing that can lead to having too much sodium in the body (hypernatremia). ?Return to your n

## 2021-12-21 NOTE — Progress Notes (Signed)
?    [place] ?[phone]   [phone] ? ?OFFICE PROGRESS NOTE ? ?[name], MD ?[address] ?[place] [address] ? ?DIAGNOSIS: Stage IV (pT1c, N1, M1b) large cell neuroendocrine carcinoma.  He presented with a right upper lobe lung nodule and hilar lymphadenopathy.  He was diagnosed in April 2022. He also had a small metastatic brain lesion.  The patient has local recurrence of his disease and the right chest wall diagnosed in November 2022. ?Molecular studies by foundation 1 showed no actionable mutations ?  ?PDL1: 0% ?  ?PRIOR THERAPY: ?1) Robotic assisted thoracic surgery with a right upper lobectomy which was performed on 12/24/2020.  ?2) SRS to a small metastatic brain lesion completed on February 13, 2021 under the care of Dr. Lisbeth Renshaw.   ?3) Adjuvant chemotherapy with cisplatin 80 mg/m2 on day 1, etoposide 100 mg per metered squared on days 1, 2, and 3, IV every 3 weeks. First dose on 02/09/21.  Status post 4 cycles. ?4) SRS to 4 new brain metastasis under the care of Dr. Lisbeth Renshaw, Last dose on 08/26/21 ?5) Palliative radiation to right chest wall from 09/01/21-09/23/21 under the care of Dr. Lisbeth Renshaw ?6) SRS to the new brain lesion along the thalamus under the care of Dr. Lisbeth Renshaw. Completed on 10/20/21 ?  ?CURRENT THERAPY:  ?1) palliative systemic chemotherapy with carboplatin for an AUC of 5, Alimta 500 mg per metered square, Keytruda 200 mg IV every 3 weeks.  First dose on 09/29/21.  Carboplatin has been removed from cycle #2 due to pancytopenia.  Status post 4 cycles ?2) SRS to the new brain lesion along the thalamus under the care of Dr. Lisbeth Renshaw. Completed on 10/20/21 ? ?INTERVAL HISTORY: ?[name] 66 y.o. male returns to the clinic today for follow-up visit accompanied by his wife.  The patient continues to complain of multiple complaints including fatigue, not feeling good as well as nausea and vomiting last night and also few days ago when he presented to the  emergency department at the [facility] and no concerning abnormality seen at that time.  He is currently on multiple pain medication that may be contributing to his condition.  He denied having any current cough or hemoptysis.  He has intermittent chest pain with shortness of breath with exertion.  He does not exercise.  He has no recent weight loss or night sweats.  He has no headache or visual changes.  He has been tolerating his treatment with maintenance Alimta and Keytruda fairly well except for the fatigue and nausea and vomiting.  He had repeat CT scan of the chest, abdomen pelvis performed recently and he is here for evaluation and discussion of his scan results before starting cycle #5 of his treatment. ? ?MEDICAL HISTORY: ?Past Medical History:  ?Diagnosis Date  ? Anemia   ? Anxiety   ? Arthritis   ? Basal cell carcinoma (BCC) of forehead   ? CAD (coronary artery disease)   ? a. 10/2015 ant STEMI >> LHC with 3 v CAD; oLAD tx with POBA >> emergent CABG. b. Multiple evals since that time, early graft failure of SVG-RCA by cath 03/2016. c. 2/19 PCI/DES x1 to pRCA, normal EF.  ? Carotid artery disease ([place])   ? a. 40-59% BICA 02/2018.  ? Depression   ? Dyspnea   ? Ectopic atrial tachycardia ([place])   ? Esophageal reflux   ? eosinophil esophagitis  ? Family history of  adverse reaction to anesthesia   ? "sister has PONV" (06/21/2017)  ? Former tobacco use   ? Gout   ? Hepatitis C   ? "treated and cured" (06/21/2017)  ? High cholesterol   ? History of blood transfusion   ? History of kidney stones   ? Hypertension   ? Ischemic cardiomyopathy   ? a. EF 25-30% at intraop TEE 4/17  //  b. Limited Echo 5/17 - EF 45-50%, mild ant HK. c. EF 55-65% by cath 09/2017.  ? Migraine   ? "3-4/yr" (06/21/2017)  ? Myocardial infarction ([place]) 10/2015  ? Palpitations   ? Pneumonia   ? Sinus bradycardia   ? a. HR dropping into 40s in 02/2016 -> BB reduced.  ? Stroke [facility]) 10/2016  ? "small one; sometimes my memory/cognitive  issues" (06/21/2017)  ? Symptomatic hypotension   ? a. 02/2016 ER visit -> meds reduced.  ? Syncope   ? Wears dentures   ? Wears glasses   ? ? ?ALLERGIES:  is allergic to prednisone, tetanus toxoids, wellbutrin [bupropion], and varenicline. ? ?MEDICATIONS:  ?Current Outpatient Medications  ?Medication Sig Dispense Refill  ? acetaminophen (TYLENOL) 500 MG tablet Take 1,000 mg by mouth every 6 (six) hours as needed for mild pain, fever or headache.    ? aspirin EC 81 MG tablet Take 81 mg by mouth daily. Swallow whole.    ? colchicine 0.6 MG tablet Take by mouth. (Patient not taking: Reported on 11/30/2021)    ? dexamethasone (DECADRON) 4 MG tablet Take one tablet PO daily x3 days, then 1/2 tablet PO daily; last dose on 12/04/21 9 tablet 0  ? diclofenac (VOLTAREN) 25 MG EC tablet Take by mouth.    ? docusate sodium (COLACE) 100 MG capsule Take 100-200 mg by mouth daily. (Patient not taking: Reported on 11/27/2021)    ? DULoxetine (CYMBALTA) 30 MG capsule Take 1 capsule (30 mg total) by mouth 2 (two) times daily. 60 capsule 1  ? ferrous gluconate (FERGON) 324 MG tablet Take by mouth daily.    ? folic acid (FOLVITE) 1 MG tablet Take 1 tablet (1 mg total) by mouth daily. 30 tablet 4  ? gabapentin (NEURONTIN) 300 MG capsule Take 2 capsules (600 mg total) by mouth 3 (three) times daily. 180 capsule 2  ? HYDROmorphone (DILAUDID) 8 MG tablet Take 1 tablet (8 mg total) by mouth every 3 (three) hours as needed for severe pain. 120 tablet 0  ? lidocaine (LIDODERM) 5 % Place 1 patch onto the skin daily as needed (for pain- Remove & Discard patch within 12 hours or as directed by MD).    ? lidocaine-prilocaine (EMLA) cream Apply to the Port-A-Cath site 30-60-minute before treatment 30 g 0  ? magnesium oxide (MAG-OX) 400 (240 Mg) MG tablet TAKE 1 TABLET(400 MG) BY MOUTH DAILY 30 tablet 1  ? methylPREDNISolone (MEDROL DOSEPAK) 4 MG TBPK tablet See admin instructions. follow package directions    ? metoprolol succinate (TOPROL-XL) 25 MG 24  hr tablet Take 1 tablet (25 mg total) by mouth daily. (Patient taking differently: Take 25 mg by mouth every evening.) 90 tablet 3  ? midodrine (PROAMATINE) 2.5 MG tablet Take 2.5 mg by mouth 3 (three) times daily as needed (AS DIRECTED).    ? morphine (MS CONTIN) 60 MG 12 hr tablet Take 1 tablet (60 mg total) by mouth every 8 (eight) hours. 90 tablet 0  ? Multiple Vitamin (MULTIVITAMIN WITH MINERALS) TABS tablet Take 1 tablet by mouth  daily.    ? ondansetron (ZOFRAN) 8 MG tablet Take 1 tablet (8 mg total) by mouth every 8 (eight) hours as needed for nausea or vomiting. Starting 3 days after chemotherapy 90 tablet 2  ? pantoprazole (PROTONIX) 40 MG tablet Take 1 tablet by mouth daily at 6 AM. 30 tablet 1  ? polyethylene glycol (MIRALAX / GLYCOLAX) 17 g packet Take 17 g by mouth daily. (Patient not taking: Reported on 11/27/2021) 14 each 0  ? prochlorperazine (COMPAZINE) 10 MG tablet Take 1 tablet (10 mg total) by mouth every 6 (six) hours as needed for nausea or vomiting. 30 tablet 0  ? ranolazine (RANEXA) 1000 MG SR tablet Take 1 tablet (1,000 mg total) by mouth 2 (two) times daily. 180 tablet 3  ? senna-docusate (SENOKOT-S) 8.6-50 MG tablet Take 1 tablet by mouth 2 (two) times daily.    ? tiZANidine (ZANAFLEX) 2 MG tablet Take 1 tablet (2 mg total) by mouth at bedtime as needed for muscle spasms. 30 tablet 2  ? XARELTO 20 MG TABS tablet Take 20 mg by mouth every evening.    ? ?No current facility-administered medications for this visit.  ? ?Facility-Administered Medications Ordered in Other Visits  ?Medication Dose Route Frequency Provider Last Rate Last Admin  ? sodium chloride flush (NS) 0.9 % injection 10 mL  10 mL Intracatheter Once Heilingoetter, Cassandra L, PA-C      ? ? ?SURGICAL HISTORY:  ?Past Surgical History:  ?Procedure Laterality Date  ? 64 GAUGE PARS PLANA VITRECTOMY WITH 20 GAUGE MVR PORT  12/31/2020  ? Procedure: 25 GAUGE PARS PLANA VITRECTOMY WITH 20 GAUGE MVR PORT;  Surgeon: [name],  MD;  Location: [facility];  Service: Thoracic;;  ? ANTERIOR CERVICAL DECOMP/DISCECTOMY FUSION N/A 10/17/2018  ? Procedure: Anterior Cervical Decompression Fusion - Cervical seven -Thoracic one;  Surgeon: Demaris Callander

## 2021-12-21 NOTE — Progress Notes (Signed)
? ?  ?Palliative Medicine ?[place]  ?[phone] [phone] ? ? ?Name: [name] ?Date: 12/21/2021 ?MRN: [id number]  ?DOB: [birth year] ? ?Patient Care Team: ?[name], MD as PCP - General (Family Medicine) ?[name], MD as PCP - Cardiology (Cardiology) ?Garner Nash, DO as Consulting Physician (Pulmonary Disease) ?Valrie Hart, RN as Oncology Nurse Navigator (Oncology) ?Maryanna Shape, NP as Nurse Practitioner (Oncology) ?Curt Bears, MD as Consulting Physician (Oncology) ?Pickenpack-Cousar, Carlena Sax, NP as Nurse Practitioner (Nurse Practitioner)  ? ? ?INTERVAL HISTORY: ?[name] is a 66 y.o. 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 He was admitted on 08/29/2021 and 09/15/21 from home with uncontrolled intractable chest wall pain. He has completed all of his radiation treatments. Now with plans to undergo immunotherapy as recommended by [facility].  ? ?SOCIAL HISTORY:    ? reports that he quit smoking about 6 years ago. His smoking use included cigarettes. He has a 33.00 pack-year smoking history. He has never used smokeless tobacco. He reports that he does not currently use alcohol. He reports that he does not use drugs. ? ?ADVANCE DIRECTIVES:  ?None on file  ? ?CODE STATUS:  ? ?PAST MEDICAL HISTORY: ?Past Medical History:  ?Diagnosis Date  ? Anemia   ? Anxiety   ? Arthritis   ? Basal cell carcinoma (BCC) of forehead   ? CAD (coronary artery disease)   ? a. 10/2015 ant STEMI >> LHC with 3 v CAD; oLAD tx with POBA >> emergent CABG. b. Multiple evals since that time,  early graft failure of SVG-RCA by cath 03/2016. c. 2/19 PCI/DES x1 to pRCA, normal EF.  ? Carotid artery disease ([place])   ? a. 40-59% BICA 02/2018.  ? Depression   ? Dyspnea   ? Ectopic atrial tachycardia ([place])   ? Esophageal reflux   ? eosinophil esophagitis  ? Family history of adverse reaction to anesthesia   ? "sister has PONV" (06/21/2017)  ? Former tobacco use   ? Gout   ? Hepatitis C   ? "treated and cured" (06/21/2017)  ? High cholesterol   ? History of blood transfusion   ? History of kidney stones   ? Hypertension   ? Ischemic cardiomyopathy   ? a. EF 25-30% at intraop TEE 4/17  //  b. Limited Echo 5/17 - EF 45-50%, mild ant HK. c. EF 55-65% by cath 09/2017.  ? Migraine   ? "3-4/yr" (06/21/2017)  ? Myocardial infarction ([place]) 10/2015  ? Palpitations   ? Pneumonia   ? Sinus bradycardia   ? a. HR dropping into 40s in 02/2016 -> BB reduced.  ? Stroke [facility]) 10/2016  ? "small one; sometimes my memory/cognitive issues" (06/21/2017)  ? Symptomatic hypotension   ? a. 02/2016 ER visit -> meds reduced.  ? Syncope   ? Wears dentures   ? Wears glasses   ? ? ?ALLERGIES:  is allergic to prednisone, tetanus toxoids, wellbutrin [bupropion], and varenicline. ? ?MEDICATIONS:  ?Current Outpatient Medications  ?Medication Sig Dispense Refill  ? acetaminophen (TYLENOL) 500 MG tablet Take 1,000 mg by mouth every 6 (six) hours as needed for mild pain, fever or  headache.    ? aspirin EC 81 MG tablet Take 81 mg by mouth daily. Swallow whole.    ? colchicine 0.6 MG tablet Take by mouth. (Patient not taking: Reported on 11/30/2021)    ? diclofenac (VOLTAREN) 25 MG EC tablet Take by mouth.    ? docusate sodium (COLACE) 100 MG capsule Take 100-200 mg by mouth daily. (Patient not taking: Reported on 11/27/2021)    ? DULoxetine (CYMBALTA) 30 MG capsule Take 1 capsule (30 mg total) by mouth 2 (two) times daily. 60 capsule 1  ? ferrous gluconate (FERGON) 324 MG tablet Take by mouth daily.    ? folic acid (FOLVITE) 1 MG tablet Take 1 tablet (1 mg  total) by mouth daily. 30 tablet 4  ? gabapentin (NEURONTIN) 300 MG capsule Take 1 capsule (300 mg total) by mouth 3 (three) times daily. 180 capsule 2  ? HYDROmorphone (DILAUDID) 4 MG tablet Take 1.5 tablets (6 mg total) by mouth every 6 (six) hours as needed for severe pain. 90 tablet 0  ? lidocaine (LIDODERM) 5 % Place 1 patch onto the skin daily as needed (for pain- Remove & Discard patch within 12 hours or as directed by MD).    ? lidocaine-prilocaine (EMLA) cream Apply to the Port-A-Cath site 30-60-minute before treatment 30 g 0  ? metoprolol succinate (TOPROL-XL) 25 MG 24 hr tablet Take 1 tablet (25 mg total) by mouth daily. (Patient taking differently: Take 25 mg by mouth every evening.) 90 tablet 3  ? morphine (MS CONTIN) 60 MG 12 hr tablet Take 1 tablet (60 mg total) by mouth every 12 (twelve) hours. 60 tablet 0  ? Multiple Vitamin (MULTIVITAMIN WITH MINERALS) TABS tablet Take 1 tablet by mouth daily.    ? ondansetron (ZOFRAN) 8 MG tablet Take 1 tablet (8 mg total) by mouth every 8 (eight) hours as needed for nausea or vomiting. Starting 3 days after chemotherapy 90 tablet 2  ? pantoprazole (PROTONIX) 40 MG tablet Take 1 tablet by mouth daily at 6 AM. 30 tablet 1  ? polyethylene glycol (MIRALAX / GLYCOLAX) 17 g packet Take 17 g by mouth daily. (Patient not taking: Reported on 11/27/2021) 14 each 0  ? prochlorperazine (COMPAZINE) 10 MG tablet Take 1 tablet (10 mg total) by mouth every 6 (six) hours as needed for nausea or vomiting. 30 tablet 0  ? senna-docusate (SENOKOT-S) 8.6-50 MG tablet Take 1 tablet by mouth 2 (two) times daily. (Patient not taking: Reported on 12/21/2021)    ? tiZANidine (ZANAFLEX) 2 MG tablet Take 1 tablet (2 mg total) by mouth at bedtime as needed for muscle spasms. 30 tablet 2  ? XARELTO 20 MG TABS tablet Take 20 mg by mouth every evening.    ? ?No current facility-administered medications for this visit.  ? ?Facility-Administered Medications Ordered in Other Visits  ?Medication Dose  Route Frequency Provider Last Rate Last Admin  ? 0.9 %  sodium chloride infusion   Intravenous Continuous Curt Bears, MD      ? heparin lock flush 100 unit/mL  500 Units Intracatheter Once PRN Curt Bears, MD      ? pembrolizumab [facility]) 200 mg in sodium chloride 0.9 % 50 mL chemo infusion  200 mg Intravenous Once Curt Bears, MD      ? PEMEtrexed (ALIMTA) 1,000 mg in sodium chloride 0.9 % 100 mL chemo infusion  500 mg/m2 (Treatment Plan Recorded) Intravenous Once Curt Bears, MD      ? prochlorperazine (COMPAZINE) tablet 10 mg  10 mg Oral Once Curt Bears, MD      ? sodium chloride flush (NS) 0.9 % injection 10 mL  10 mL Intracatheter PRN Curt Bears, MD      ? ? ?VITAL SIGNS: ?There were no vitals taken for this visit. ?There were no vitals filed for this visit. ?  ?Estimated body mass index is 24.87 kg/m? as calculated from the following: ?  Height as of 12/17/21: 5\' 9"  (1.753 m). ?  Weight as of an earlier encounter on 12/21/21: 168 lb 6.4 oz (76.4 kg). ? ? ?PERFORMANCE STATUS (ECOG) : 1 - Symptomatic but completely ambulatory ? ? ?Physical Exam ?General: NAD ?Pulmonary: normal breathing pattern  ?Cardio: Tachycardic  ?Neurological: AAOx 4, appears comfortable ? ?IMPRESSION: ? ?Mr. Ellsworth presents to the clinic today for symptom management follow-up. His wife is with him for support. Reports he is feeling better. Recent ER visit to [facility] with complaints of bilateral lower extremity swelling, abdominal pain, and nausea/vomiting x2 weeks. Work-up completed with no acute changes or abnormalities found. He was able to discharge home by private vehicle. Seen by Dr. [name] today with plans to proceed with treatment and 1L IVF. Endorses ongoing fatigue. Is taking zofran and compazine for nausea which he reports helps.  ? ?Neoplasm related pain ?Pain is well controlled on current regimen. He is not having to take breakthrough medications around the clock. We reviewed  current regimen at length and how it may be contributing to his ongoing nausea. Chronic use of opioids certainly can contribute to his nausea and education was provided on this to both he and his wife. He

## 2021-12-21 NOTE — Progress Notes (Signed)
Oncology Nurse Navigator Documentation ? ? ?  12/21/2021  ? 10:00 AM 09/09/2021  ? 10:00 AM  ?Oncology Nurse Navigator Flowsheets  ?Navigator Location [facility]  ?Navigator Encounter Type Clinic/MDC Clinic/MDC  ?Patient Visit Type MedOnc MedOnc  ?Treatment Phase Treatment Treatment  ?Barriers/Navigation Needs Education/patient is having complaints of fatigue. Dr. Worthy Flank recommendation is to exercise. I added information on AVS on this information.   Coordination of Care  ?Education Other   ?Interventions Education Coordination of Care  ?Acuity Level 2-Minimal Needs (1-2 Barriers Identified) Level 3-Moderate Needs (3-4 Barriers Identified)  ?Coordination of Care  Other  ?Education Method Other   ?Time Spent with Patient 15 45  ?  ?

## 2021-12-21 NOTE — Patient Instructions (Signed)
Exercising to Stay Healthy To become healthy and stay healthy, it is recommended that you do moderate-intensity and vigorous-intensity exercise. You can tell that you are exercising at a moderate intensity if your heart starts beating faster and you start breathing faster but can still hold a conversation. You can tell that you are exercising at a vigorous intensity if you are breathing much harder and faster and cannot hold a conversation while exercising. How can exercise benefit me? Exercising regularly is important. It has many health benefits, such as: Improving overall fitness, flexibility, and endurance. Increasing bone density. Helping with weight control. Decreasing body fat. Increasing muscle strength and endurance. Reducing stress and tension, anxiety, depression, or anger. Improving overall health. What guidelines should I follow while exercising? Before you start a new exercise program, talk with your health care provider. Do not exercise so much that you hurt yourself, feel dizzy, or get very short of breath. Wear comfortable clothes and wear shoes with good support. Drink plenty of water while you exercise to prevent dehydration or heat stroke. Work out until your breathing and your heartbeat get faster (moderate intensity). How often should I exercise? Choose an activity that you enjoy, and set realistic goals. Your health care provider can help you make an activity plan that is individually designed and works best for you. Exercise regularly as told by your health care provider. This may include: Doing strength training two times a week, such as: Lifting weights. Using resistance bands. Push-ups. Sit-ups. Yoga. Doing a certain intensity of exercise for a given amount of time. Choose from these options: A total of 150 minutes of moderate-intensity exercise every week. A total of 75 minutes of vigorous-intensity exercise every week. A mix of moderate-intensity and  vigorous-intensity exercise every week. Children, pregnant women, people who have not exercised regularly, people who are overweight, and older adults may need to talk with a health care provider about what activities are safe to perform. If you have a medical condition, be sure to talk with your health care provider before you start a new exercise program. What are some exercise ideas? Moderate-intensity exercise ideas include: Walking 1 mile (1.6 km) in about 15 minutes. Biking. Hiking. Golfing. Dancing. Water aerobics. Vigorous-intensity exercise ideas include: Walking 4.5 miles (7.2 km) or more in about 1 hour. Jogging or running 5 miles (8 km) in about 1 hour. Biking 10 miles (16.1 km) or more in about 1 hour. Lap swimming. Roller-skating or in-line skating. Cross-country skiing. Vigorous competitive sports, such as football, basketball, and soccer. Jumping rope. Aerobic dancing. What are some everyday activities that can help me get exercise? Yard work, such as: Pushing a lawn mower. Raking and bagging leaves. Washing your car. Pushing a stroller. Shoveling snow. Gardening. Washing windows or floors. How can I be more active in my day-to-day activities? Use stairs instead of an elevator. Take a walk during your lunch break. If you drive, park your car farther away from your work or school. If you take public transportation, get off one stop early and walk the rest of the way. Stand up or walk around during all of your indoor phone calls. Get up, stretch, and walk around every 30 minutes throughout the day. Enjoy exercise with a friend. Support to continue exercising will help you keep a regular routine of activity. Where to find more information You can find more information about exercising to stay healthy from: U.S. Department of Health and Human Services: www.hhs.gov Centers for Disease Control and Prevention (  CDC): www.cdc.gov Summary Exercising regularly is  important. It will improve your overall fitness, flexibility, and endurance. Regular exercise will also improve your overall health. It can help you control your weight, reduce stress, and improve your bone density. Do not exercise so much that you hurt yourself, feel dizzy, or get very short of breath. Before you start a new exercise program, talk with your health care provider. This information is not intended to replace advice given to you by your health care provider. Make sure you discuss any questions you have with your health care provider. Document Revised: 12/12/2020 Document Reviewed: 12/12/2020 Elsevier Patient Education  2023 Elsevier Inc.  

## 2021-12-23 ENCOUNTER — Telehealth: Payer: Self-pay | Admitting: Internal Medicine

## 2021-12-23 ENCOUNTER — Other Ambulatory Visit (HOSPITAL_COMMUNITY): Payer: Self-pay

## 2021-12-23 ENCOUNTER — Other Ambulatory Visit: Payer: Self-pay

## 2021-12-23 NOTE — Telephone Encounter (Signed)
Called patient regarding May and June appointments, patient is notified.  ?

## 2021-12-24 ENCOUNTER — Other Ambulatory Visit: Payer: Self-pay | Admitting: Nurse Practitioner

## 2021-12-26 ENCOUNTER — Other Ambulatory Visit (HOSPITAL_COMMUNITY): Payer: Self-pay

## 2021-12-28 ENCOUNTER — Telehealth: Payer: Self-pay

## 2021-12-28 ENCOUNTER — Other Ambulatory Visit: Payer: Self-pay | Admitting: Nurse Practitioner

## 2021-12-28 ENCOUNTER — Encounter: Payer: Self-pay | Admitting: Nurse Practitioner

## 2021-12-28 ENCOUNTER — Other Ambulatory Visit (HOSPITAL_COMMUNITY): Payer: Self-pay

## 2021-12-28 MED ORDER — HYDROMORPHONE HCL 4 MG PO TABS: 6.0000 mg | ORAL_TABLET | Freq: Four times a day (QID) | ORAL | 0 refills | Status: DC | PRN

## 2021-12-28 MED ORDER — HYDROMORPHONE HCL 4 MG PO TABS
6.0000 mg | ORAL_TABLET | Freq: Four times a day (QID) | ORAL | 0 refills | Status: DC | PRN
  Filled 2021-12-28: qty 90, 15d supply, fill #0

## 2021-12-28 MED ORDER — MORPHINE SULFATE ER 60 MG PO TBCR
60.0000 mg | EXTENDED_RELEASE_TABLET | Freq: Two times a day (BID) | ORAL | 0 refills | Status: DC
  Filled 2021-12-28: qty 60, 30d supply, fill #0

## 2021-12-28 MED ORDER — MORPHINE SULFATE ER 60 MG PO TBCR: 60.0000 mg | EXTENDED_RELEASE_TABLET | Freq: Two times a day (BID) | ORAL | 0 refills | Status: DC

## 2021-12-28 NOTE — Progress Notes (Signed)
Received a call from [facility] questioning medication and with concerns he was not able to pick up his prescriptions over the weekend (specifically his hydromorphone). Medications were sent to pharmacy however was unable to receive upon follow-up today. Myself and [place], RN spoke with [facility] and [facility] to inquire about patient's recent refills and dates. He last picked up his MS Contin on 11/03/21 and hydromorphone on 12/08/21.  ? ?I spoke back with Simona Huh to inquire about his MS Contin as a refill was sent on 12/04/21. Reports he had previous medication from prior script that he was using and did not have to get a new prescription however he is out now. Hydromorphone has been sent and verified with [facility] pharmacy in addition to [facility]. He is aware both medications are available to pick-up.  ? ?1400-patient called inquiring about his dosage of medications. States he has not required hydromorphone as often as he did before which he is appreciative of and also was taking MS Contin every 12 hours vs every 8 hours. Reviewed previous visit and discussion as he also called on last week to confirm dosage changes. Education provided on the rational for weaning with hopes of decreasing/resolving other symptoms such as nausea which could be due to refractory opioid use. He is aware that we will carefully wean medications and current changes are appropriate and given reported home regimen should not pose of any concerns.  ? ?I reviewed at length his new regimen of 6 mg of hydromorphone every 6 hours as needed. (decreased from 8 mg every 4-6 hours as needed). MS Contin 60 mg every 12 hours (decreased from 60 mg every 8 hours ) however he expressed he was taking every 12 as sometimes he forgot or was not at a place to take midday.  ? ?He verbalized understanding.  ?

## 2021-12-28 NOTE — Telephone Encounter (Signed)
Pt called to confirm dose, dose confirmed with pt and NP, pt verbalized understanding, no further questions or concerns.  ?

## 2021-12-28 NOTE — Telephone Encounter (Signed)
Pt called regarding pain medication refill. Medication called in by NP, provided pt with this information. Pt also reported nausea with 2x vomiting due to the pain. Meds called into [facility] outpatient pharmacy. ?

## 2022-01-01 ENCOUNTER — Telehealth: Payer: Self-pay | Admitting: *Deleted

## 2022-01-01 ENCOUNTER — Other Ambulatory Visit: Payer: Self-pay | Admitting: Nurse Practitioner

## 2022-01-01 MED ORDER — HYDROMORPHONE HCL 4 MG PO TABS: 4.0000 mg | ORAL_TABLET | ORAL | 0 refills | Status: DC | PRN

## 2022-01-01 NOTE — Telephone Encounter (Signed)
Patient called office -  ?LVM: Has several concerns with change in medication for pain. Trying to take 1 1/2 pills every 6 hours as prescribed has been difficult. Pills aren't scored and do not split well with pill splitter - they crush. He's cutting with sharp knife and that is minimally better. He said that he's basically only taking one [4 mg] pill every 6 hours and pain is not relieved.  ? ?He felt very bad over weekend with withdrawal, but is feeling a little better now. ? ?Patient asked if he could take 1 tablet [4 mg] every 4 hours instead of 1 and 1/2 every 6 hours as it is the same amount of medicine and still 6 pills every 24 hours, as was originally prescribed.  ? ?Information/message given to  Ms. Pickenpack-Cousar, NP.  ?Per Ms Debera Lat, inform patient he may take 1 [4 mg] tablet every 4 hours.  ? ?TCT patient with provider's directions for medication. Patent verbalized understanding.  ?

## 2022-01-04 ENCOUNTER — Other Ambulatory Visit: Payer: Self-pay | Admitting: Nurse Practitioner

## 2022-01-04 ENCOUNTER — Telehealth (HOSPITAL_BASED_OUTPATIENT_CLINIC_OR_DEPARTMENT_OTHER): Payer: Medicare Other | Admitting: Nurse Practitioner

## 2022-01-04 ENCOUNTER — Encounter: Payer: Self-pay | Admitting: Nurse Practitioner

## 2022-01-04 ENCOUNTER — Other Ambulatory Visit: Payer: Self-pay | Admitting: Internal Medicine

## 2022-01-04 ENCOUNTER — Telehealth: Payer: Self-pay | Admitting: *Deleted

## 2022-01-04 DIAGNOSIS — Z515 Encounter for palliative care: Secondary | ICD-10-CM

## 2022-01-04 DIAGNOSIS — G893 Neoplasm related pain (acute) (chronic): Secondary | ICD-10-CM

## 2022-01-04 DIAGNOSIS — R630 Anorexia: Secondary | ICD-10-CM

## 2022-01-04 DIAGNOSIS — R110 Nausea: Secondary | ICD-10-CM

## 2022-01-04 MED ORDER — HYDROMORPHONE HCL 4 MG PO TABS: 8.0000 mg | ORAL_TABLET | Freq: Four times a day (QID) | ORAL | 0 refills | Status: DC | PRN

## 2022-01-04 NOTE — Telephone Encounter (Signed)
[name] spoke with [name], RN at request by myself to call and discuss his reported symptoms of withdrawal to include headache, nausea, and vomiting. He initially called Radiation Oncology with concerns for brain changes.  ? ?I spoke at length with patient and his wife regarding pain regimen and perception of withdrawals due to changes to regimen on 12/28/2021. [name] and I also spoke on last week regarding recommended changes with a goal of weaning pain medication and decreasing nausea/vomiting in the setting of long-term high dosing opioid use. In addition he has reported that pain was improved and he has not required as frequent medication (hydromorphone) use.  ? ?[name] reports he has not taken his hydromorphone since 8pm on Friday 01/01/22. He chose to self discontinue this medication reporting "if I am going to have to go through withdrawals and weaning, I minus well just stop it and deal with the side effects!" I strongly advised against this chosen method given he has been on hydromorphone for greater than 3 months at increase dosages. Extensive education provided on high risk of severe withdrawals and health complications again emphasizing I did not support him abruptly stopping.  ? ?He reports nausea with vomiting episode 3-4 times over the past 24 hours. He has zofran and compazine in the home. He is only using when he throws up. Last dose of zofran was around 8am and last dose of compazine was yesterday evening. States he has not eaten today due to lack of appetite and ongoing nausea. Education provided on use of antiemetics to assist in decreasing nausea. Encouraged small frequent meals, bland foods. He verbalized understanding.  ? ?He is requesting for his MRI to be moved to a sooner date in concerns that he may have changes in his brain. He continues to state he is unsure if he is having withdrawals versus just ongoing symptoms. Of note he was having some nausea and vomiting prior to weaning of  medications.  ? ?[name] and his wife verbalized understanding of request. I reviewed at length with him weaning process with education provided on what future weeks-months would look like and goal of completely discontinuing hydromorphone versus lowest dose possible to support his pain.  ? ?Education provided on medication regimen based on 20-30% reduction: ? ?MS Contin 60 mg every 12 hours x 1 months (120 mg/day) ?MS Contin 45 mg every 12 hours x 1 month (90mg /day) ?MS Contin 30 mg every 12 hours x 1 month (60 mg/day) ?MS Contin 15 mg every 12 hours x1 month (30 mg/day)  ? ?Hydromorphone 8 mg every 6 hours x 2 weeks (32 mg/day)  ?Hydromorphone 4 mg every 4 hours x2 weeks (24 mg/day) ?Hydromorphone 4 mg every 6 hours x 3 weeks (16 mg) then we will further discuss  ? ?He reports he is in some pain but able to "tolerate". Continues on MS Contin every 12 hours as prescribed. I will plan to discuss with my medical director for further clarification and support of proposed weaning schedule for confirmation.  ? ? ?1505: After discussions with Dr. Domingo Villa (Palliative MD) current proposed regimen is appropriate based on previous use. Again advised [name] of above schedule to prevent any further complications or severe withdrawals. Education provided on anti-emetic use. He verbalized understanding.  ? ?Visit consisted of counseling and education dealing with the complex and emotionally intense issues of symptom management and palliative care in the setting of serious and potentially life-threatening illness.Greater than 50%  of this time was spent counseling and coordinating  care related to the above assessment and plan. ? ?[name], AGPCNP-BC  ?[place] ? ? ?

## 2022-01-04 NOTE — Telephone Encounter (Signed)
Reached out to patient to follow up on previous call from Friday.  Patient states that he is currently vomiting 3 - 4 times daily.  Nausea medications have not been filled since 09/29/21 and he has some left, as he states he has not needed to use them until he began weaning off of his Dilaudid and MS Contin, per his request.  States he needs a refill on Zofran.  Expressed that he is not eating well and weight is 163 lb, down 5 lb from 12/21/21.  Per patient, he has only taken two dilaudid since Friday and "just wants to stop the pain meds if this is how it is going to make him feel."  I advised that the prescribed weaning process was necessary, as directed to minimize withdrawal symptoms.  He is still taking MS Contin 60 mg BID.  Complaining that he has severe headaches, which I advised may also be a side effect of coming off of the dilaudid too abruptly, to which he responded "I am more concerned that there is something going on with my brain."  Reviewed weaning plan once again and he is reluctant to follow instructions and wants to stop completely, despite being advised that this was not an approved option due to the risks associated with withdrawal.  Requested that MR Brain be moved up to ease his mind.  Anthony Gibbon, NP made aware of conversation.  Will route to Dr.Mohamed as well. ?

## 2022-01-05 ENCOUNTER — Encounter: Payer: Self-pay | Admitting: Physician Assistant

## 2022-01-07 ENCOUNTER — Other Ambulatory Visit (HOSPITAL_COMMUNITY): Payer: Self-pay

## 2022-01-07 ENCOUNTER — Other Ambulatory Visit: Payer: Self-pay | Admitting: Nurse Practitioner

## 2022-01-07 MED ORDER — HYDROMORPHONE HCL 4 MG PO TABS
8.0000 mg | ORAL_TABLET | Freq: Four times a day (QID) | ORAL | 0 refills | Status: DC | PRN
  Filled 2022-01-12: qty 90, 12d supply, fill #0

## 2022-01-08 NOTE — Progress Notes (Signed)
[facility] ?OFFICE PROGRESS NOTE ? ?[name], MD ?[address] ?[place] [address] ? ?DIAGNOSIS:  Stage IV (pT1c, N1, M1b) large cell neuroendocrine carcinoma.  He presented with a right upper lobe lung nodule and hilar lymphadenopathy.  He was diagnosed in April 2022. He also had a small metastatic brain lesion.  The patient has local recurrence of his disease and the right chest wall diagnosed in November 2022. ?Molecular studies by foundation 1 showed no actionable mutations ?  ?PDL1: 0% ? ?PRIOR THERAPY: ?1) Robotic assisted thoracic surgery with a right upper lobectomy which was performed on 12/24/2020.  ?2) SRS to a small metastatic brain lesion completed on February 13, 2021 under the care of Dr. Lisbeth Renshaw.   ?3) Adjuvant chemotherapy with cisplatin 80 mg/m2 on day 1, etoposide 100 mg per metered squared on days 1, 2, and 3, IV every 3 weeks. First dose on 02/09/21.  Status post 4 cycles. ?4) SRS to 4 new brain metastasis under the care of Dr. Lisbeth Renshaw, Last dose on 08/26/21 ?5) Palliative radiation to right chest wall from 09/01/21-09/23/21 under the care of Dr. Lisbeth Renshaw ?6) 2) SRS to the new brain lesion along the thalamus under the care of Dr. Lisbeth Renshaw. Completed on 10/27/21 ? ?CURRENT THERAPY: 1) palliative systemic chemotherapy with carboplatin for an AUC of 5, Alimta 500 mg per metered square, Keytruda 200 mg IV every 3 weeks.  First dose on 09/29/21.  Carboplatin has been removed from cycle #2 due to pancytopenia.  Status post 5 cycles.  ? ?INTERVAL HISTORY: ?[name] 66 y.o. male returns to the clinic today for a follow-up visit. At the patient's last appointment, he was endorsing worsening multiple concerns including fatigue, nausea and vomiting, intermittent chest pain, and shortness of breath.  He also is endorsing headaches.  Interestingly, for the nausea and vomiting that he was reporting it appears that his most recent refill of Zofran was on 08/27/2022 additionally, it looks  like his last prescription of Compazine was prescribed on 09/29/2020.  He also has been trying to wean off of his pain medications. He is followed closely by palliative care and is meeting with them today. He is concerned because he has been having increased frequency of headaches and nausea and vomiting.  Therefore radiation oncology had moved his brain MRI scan up and this is now scheduled for 01/14/22. He is scheduled for a dedicated follow up with radiation oncology to review the results of his brain MRI on Monday. I discussed with the patient that his scan will be reviewed at the brain tumor board Monday morning and then he will have a telephone appointment later in the day to review the results and recommendations. Due to decadron affecting the effectiveness of immunotherapy, he is also no longer on Decadron. I asked the patient if the onset of his nausea/vomiting was around when decadron was discontinued. He is not sure but feels it may be correlated. He also had swelling a few weeks ago which may have been exacerbated by the steroids.  ? ?From Thursday to last night, the patient reports that he had nausea and vomiting.  The worst of this was on Saturday in which he reported 7-8 episodes of nausea and vomiting.  He presented to the emergency room Sunday morning.  He had a CT of the abdomen pelvis which did not note any acute abnormalities.  His labs also were unremarkable.  He was unable to give a urine specimen in  the emergency room.  He had an EKG yesterday that showed sinus tachycardia.  The patient also reports that he was constipated during this time and there was concern for bowel obstruction.  The patient reports that he used to eat a lot of fruits and vegetables but has not been doing that as much lately.  He is not taking stool softener on a regular basis.  In the emergency room there is no acute findings.  There is no significant anemia, electrolyte abnormality, or kidney injury.  His gallbladder and  liver enzymes were within normal limits.  The CT scan did not show any acute findings.  He received IV fluids, IV Compazine, and IV Benadryl and his symptoms improved and he was discharged home.  He reports he has not had any nausea or vomiting today.  Unclear if he is taking his antiemetics as prescribed as his antiemetics have not been refilled in several months. ? ?Today, the patient reports that he feels at his baseline with the exception of some generalized weakness and lightheadedness.  Carboplatin was previously removed from his treatment plan due to intolerance.  He is currently on Keytruda which is immunotherapy and Alimta which is chemotherapy.  Today he denies any fever, chills, or night sweats.  He reports decreased appetite and weight loss.  He is scheduled to meet with the dietician today in the infusion room. He does not drink supplemental drinks. Per chart review it appears that he lost approximately 6 pounds.  He denies hemoptysis.  He sometimes has chest discomfort due to the right rib fractures.  As previously mentioned he is followed by palliative care for pain management.  He reports his shortness of breath may be a little worse with exertion.  He denies any recent sick contacts.  Denies any associated sore throat.  He thinks he may have had a fever of 101 last week but did not call the clinic or be evaluated.  He denies any hemoptysis.  He is here today for evaluation and repeat blood work before starting cycle #6. ? ? ?MEDICAL HISTORY: ?Past Medical History:  ?Diagnosis Date  ? Anemia   ? Anxiety   ? Arthritis   ? Basal cell carcinoma (BCC) of forehead   ? CAD (coronary artery disease)   ? a. 10/2015 ant STEMI >> LHC with 3 v CAD; oLAD tx with POBA >> emergent CABG. b. Multiple evals since that time, early graft failure of SVG-RCA by cath 03/2016. c. 2/19 PCI/DES x1 to pRCA, normal EF.  ? Carotid artery disease ([place])   ? a. 40-59% BICA 02/2018.  ? Depression   ? Dyspnea   ? Ectopic atrial  tachycardia ([place])   ? Esophageal reflux   ? eosinophil esophagitis  ? Family history of adverse reaction to anesthesia   ? "sister has PONV" (06/21/2017)  ? Former tobacco use   ? Gout   ? Hepatitis C   ? "treated and cured" (06/21/2017)  ? High cholesterol   ? History of blood transfusion   ? History of kidney stones   ? Hypertension   ? Ischemic cardiomyopathy   ? a. EF 25-30% at intraop TEE 4/17  //  b. Limited Echo 5/17 - EF 45-50%, mild ant HK. c. EF 55-65% by cath 09/2017.  ? Migraine   ? "3-4/yr" (06/21/2017)  ? Myocardial infarction ([place]) 10/2015  ? Palpitations   ? Pneumonia   ? Sinus bradycardia   ? a. HR dropping into 40s in 02/2016 ->  BB reduced.  ? Stroke [facility]) 10/2016  ? "small one; sometimes my memory/cognitive issues" (06/21/2017)  ? Symptomatic hypotension   ? a. 02/2016 ER visit -> meds reduced.  ? Syncope   ? Wears dentures   ? Wears glasses   ? ? ?ALLERGIES:  is allergic to prednisone, tetanus toxoids, wellbutrin [bupropion], and varenicline. ? ?MEDICATIONS:  ?Current Outpatient Medications  ?Medication Sig Dispense Refill  ? acetaminophen (TYLENOL) 500 MG tablet Take 1,000 mg by mouth every 6 (six) hours as needed for mild pain, fever or headache.    ? aspirin EC 81 MG tablet Take 81 mg by mouth daily. Swallow whole.    ? colchicine 0.6 MG tablet Take by mouth. (Patient not taking: Reported on 11/30/2021)    ? diclofenac (VOLTAREN) 25 MG EC tablet Take by mouth.    ? docusate sodium (COLACE) 100 MG capsule Take 100-200 mg by mouth daily. (Patient not taking: Reported on 11/27/2021)    ? DULoxetine (CYMBALTA) 30 MG capsule Take 1 capsule (30 mg total) by mouth 2 (two) times daily. 60 capsule 1  ? ferrous gluconate (FERGON) 324 MG tablet Take by mouth daily.    ? folic acid (FOLVITE) 1 MG tablet Take 1 tablet (1 mg total) by mouth daily. 30 tablet 4  ? gabapentin (NEURONTIN) 300 MG capsule Take 1 capsule (300 mg total) by mouth 3 (three) times daily. 180 capsule 2  ? HYDROmorphone (DILAUDID) 4 MG tablet  Take 2 tablets (8 mg total) by mouth every 6 (six) hours as needed for severe pain. 90 tablet 0  ? lidocaine (LIDODERM) 5 % Place 1 patch onto the skin daily as needed (for pain- Remove & Discard patch withi

## 2022-01-10 ENCOUNTER — Encounter (HOSPITAL_BASED_OUTPATIENT_CLINIC_OR_DEPARTMENT_OTHER): Payer: Self-pay | Admitting: Emergency Medicine

## 2022-01-10 ENCOUNTER — Emergency Department (HOSPITAL_BASED_OUTPATIENT_CLINIC_OR_DEPARTMENT_OTHER)
Admission: EM | Admit: 2022-01-10 | Discharge: 2022-01-10 | Disposition: A | Discharge status: Home / Self Care | Payer: Medicare Other | Attending: Emergency Medicine | Admitting: Emergency Medicine

## 2022-01-10 ENCOUNTER — Other Ambulatory Visit: Payer: Self-pay

## 2022-01-10 ENCOUNTER — Emergency Department (HOSPITAL_BASED_OUTPATIENT_CLINIC_OR_DEPARTMENT_OTHER): Payer: Medicare Other

## 2022-01-10 DIAGNOSIS — K5900 Constipation, unspecified: Secondary | ICD-10-CM | POA: Diagnosis not present

## 2022-01-10 DIAGNOSIS — R109 Unspecified abdominal pain: Secondary | ICD-10-CM | POA: Diagnosis not present

## 2022-01-10 DIAGNOSIS — Z7901 Long term (current) use of anticoagulants: Secondary | ICD-10-CM | POA: Insufficient documentation

## 2022-01-10 DIAGNOSIS — Z8589 Personal history of malignant neoplasm of other organs and systems: Secondary | ICD-10-CM | POA: Insufficient documentation

## 2022-01-10 DIAGNOSIS — R112 Nausea with vomiting, unspecified: Secondary | ICD-10-CM | POA: Diagnosis present

## 2022-01-10 DIAGNOSIS — R197 Diarrhea, unspecified: Secondary | ICD-10-CM | POA: Insufficient documentation

## 2022-01-10 DIAGNOSIS — I2510 Atherosclerotic heart disease of native coronary artery without angina pectoris: Secondary | ICD-10-CM | POA: Insufficient documentation

## 2022-01-10 DIAGNOSIS — Z7982 Long term (current) use of aspirin: Secondary | ICD-10-CM | POA: Insufficient documentation

## 2022-01-10 LAB — CBC WITH DIFFERENTIAL/PLATELET
Abs Immature Granulocytes: 0.05 10*3/uL (ref 0.00–0.07)
Basophils Absolute: 0 10*3/uL (ref 0.0–0.1)
Basophils Relative: 0 %
Eosinophils Absolute: 0 10*3/uL (ref 0.0–0.5)
Eosinophils Relative: 0 %
HCT: 34.5 % — ABNORMAL LOW (ref 39.0–52.0)
Hemoglobin: 11.8 g/dL — ABNORMAL LOW (ref 13.0–17.0)
Immature Granulocytes: 1 %
Lymphocytes Relative: 9 %
Lymphs Abs: 0.9 10*3/uL (ref 0.7–4.0)
MCH: 32.8 pg (ref 26.0–34.0)
MCHC: 34.2 g/dL (ref 30.0–36.0)
MCV: 95.8 fL (ref 80.0–100.0)
Monocytes Absolute: 1.2 10*3/uL — ABNORMAL HIGH (ref 0.1–1.0)
Monocytes Relative: 11 %
Neutro Abs: 8.3 10*3/uL — ABNORMAL HIGH (ref 1.7–7.7)
Neutrophils Relative %: 79 %
Platelets: 356 10*3/uL (ref 150–400)
RBC: 3.6 MIL/uL — ABNORMAL LOW (ref 4.22–5.81)
RDW: 15.1 % (ref 11.5–15.5)
WBC: 10.4 10*3/uL (ref 4.0–10.5)
nRBC: 0 % (ref 0.0–0.2)

## 2022-01-10 LAB — COMPREHENSIVE METABOLIC PANEL
ALT: 16 U/L (ref 0–44)
AST: 27 U/L (ref 15–41)
Albumin: 3.4 g/dL — ABNORMAL LOW (ref 3.5–5.0)
Alkaline Phosphatase: 106 U/L (ref 38–126)
Anion gap: 9 (ref 5–15)
BUN: 19 mg/dL (ref 8–23)
CO2: 23 mmol/L (ref 22–32)
Calcium: 9.5 mg/dL (ref 8.9–10.3)
Chloride: 103 mmol/L (ref 98–111)
Creatinine, Ser: 0.92 mg/dL (ref 0.61–1.24)
GFR, Estimated: >60 mL/min (ref >60)
Glucose, Bld: 103 mg/dL — ABNORMAL HIGH (ref 70–99)
Potassium: 3.6 mmol/L (ref 3.5–5.1)
Sodium: 135 mmol/L (ref 135–145)
Total Bilirubin: 1 mg/dL (ref 0.3–1.2)
Total Protein: 7.6 g/dL (ref 6.5–8.1)

## 2022-01-10 LAB — LIPASE, BLOOD: Lipase: 21 U/L (ref 11–51)

## 2022-01-10 MED ORDER — PROMETHAZINE HCL 25 MG/ML IJ SOLN
INTRAMUSCULAR | Status: AC
  Filled 2022-01-10: qty 1

## 2022-01-10 MED ORDER — FENTANYL CITRATE PF 50 MCG/ML IJ SOSY
50.0000 ug | PREFILLED_SYRINGE | Freq: Once | INTRAMUSCULAR | Status: AC
  Administered 2022-01-10: 50 ug via INTRAVENOUS
  Filled 2022-01-10: qty 1

## 2022-01-10 MED ORDER — PROCHLORPERAZINE EDISYLATE 10 MG/2ML IJ SOLN
10.0000 mg | Freq: Once | INTRAMUSCULAR | Status: AC
  Administered 2022-01-10: 10 mg via INTRAVENOUS
  Filled 2022-01-10: qty 2

## 2022-01-10 MED ORDER — SODIUM CHLORIDE 0.9 % IV BOLUS
1000.0000 mL | Freq: Once | INTRAVENOUS | Status: AC
  Administered 2022-01-10: 1000 mL via INTRAVENOUS

## 2022-01-10 MED ORDER — SODIUM CHLORIDE 0.9 % IV SOLN
INTRAVENOUS | Status: DC | PRN
Start: 2022-01-10 — End: 2022-01-10

## 2022-01-10 MED ORDER — DIPHENHYDRAMINE HCL 50 MG/ML IJ SOLN
12.5000 mg | Freq: Once | INTRAMUSCULAR | Status: AC
  Administered 2022-01-10: 12.5 mg via INTRAVENOUS
  Filled 2022-01-10: qty 1

## 2022-01-10 MED ORDER — IOHEXOL 300 MG/ML  SOLN
100.0000 mL | Freq: Once | INTRAMUSCULAR | Status: AC | PRN
  Administered 2022-01-10: 100 mL via INTRAVENOUS

## 2022-01-10 MED ORDER — SODIUM CHLORIDE 0.9 % IV SOLN
12.5000 mg | Freq: Four times a day (QID) | INTRAVENOUS | Status: DC | PRN
  Administered 2022-01-10: 12.5 mg via INTRAVENOUS
  Filled 2022-01-10: qty 0.5

## 2022-01-10 NOTE — ED Notes (Signed)
Pt aware urine specimen ordered. Pt reports inability to provide specimen at this time. Specimen collection device provided to patient. 

## 2022-01-10 NOTE — ED Triage Notes (Addendum)
States," I think I am dehydrated" has been having n/v x 2 days . Currently taking chemo. ?

## 2022-01-10 NOTE — ED Provider Notes (Signed)
?[facility] EMERGENCY DEPARTMENT ?Provider Note ? ? ?CSN: [id number] ?Arrival date & time: 01/10/22  9381 ? ?  ? ?History ? ?Chief Complaint  ?Patient presents with  ? Emesis  ? ? ?[name] is a 66 y.o. male. ? ?History of CAD, cardiomyopathy, neuroendocrine carcinoma on immunotherapy, chronic narcotics for chronic pain management who presents with nausea, vomiting, diarrhea.  Had recent constipation dated aggressive bowel regimen.  Cut back on narcotic pain medicine.  Denies any fevers or chills.  Has had some abdominal cramping.  Denies any headache, chest pain, shortness of breath.  No suspicious food intake.  Has tried Zofran and Compazine at home without much help.  He feels like he is dehydrated.  He cut back on some of his narcotic pain medicine but has started to take it again. ? ? ?Emesis ? ?  ? ?Home Medications ?Prior to Admission medications   ?Medication Sig Start Date End Date Taking? Authorizing Provider  ?acetaminophen (TYLENOL) 500 MG tablet Take 1,000 mg by mouth every 6 (six) hours as needed for mild pain, fever or headache.    [provider]  ?aspirin EC 81 MG tablet Take 81 mg by mouth daily. Swallow whole.    [provider]  ?colchicine 0.6 MG tablet Take by mouth. ?Patient not taking: Reported on 11/30/2021 11/17/21   [provider]  ?diclofenac (VOLTAREN) 25 MG EC tablet Take by mouth. 11/01/21   [provider]  ?docusate sodium (COLACE) 100 MG capsule Take 100-200 mg by mouth daily. ?Patient not taking: Reported on 11/27/2021    [provider]  ?DULoxetine (CYMBALTA) 30 MG capsule Take 1 capsule (30 mg total) by mouth 2 (two) times daily. 12/04/21   Pickenpack-Cousar, Carlena Sax, NP  ?ferrous gluconate (FERGON) 324 MG tablet Take by mouth daily. 10/28/21   [provider]  ?folic acid (FOLVITE) 1 MG tablet Take 1 tablet (1 mg total) by mouth daily. 09/17/21   Curt Bears, MD  ?gabapentin (NEURONTIN) 300 MG capsule Take 1  capsule (300 mg total) by mouth 3 (three) times daily. 12/21/21   Pickenpack-Cousar, Carlena Sax, NP  ?HYDROmorphone (DILAUDID) 4 MG tablet Take 2 tablets (8 mg total) by mouth every 6 (six) hours as needed for severe pain. 01/09/22   Pickenpack-Cousar, Carlena Sax, NP  ?lidocaine (LIDODERM) 5 % Place 1 patch onto the skin daily as needed (for pain- Remove & Discard patch within 12 hours or as directed by MD).    [provider]  ?lidocaine-prilocaine (EMLA) cream Apply to the Port-A-Cath site 30-60-minute before treatment 11/09/21   Curt Bears, MD  ?metoprolol succinate (TOPROL-XL) 25 MG 24 hr tablet Take 1 tablet (25 mg total) by mouth daily. ?Patient taking differently: Take 25 mg by mouth every evening. 08/27/20   [name], MD  ?morphine (MS CONTIN) 60 MG 12 hr tablet Take 1 tablet by mouth every 12 hours. 12/28/21   Pickenpack-Cousar, Carlena Sax, NP  ?Multiple Vitamin (MULTIVITAMIN WITH MINERALS) TABS tablet Take 1 tablet by mouth daily.    [provider]  ?ondansetron (ZOFRAN) 8 MG tablet Take 1 tablet (8 mg total) by mouth every 8 (eight) hours as needed for nausea or vomiting. Starting 3 days after chemotherapy 08/27/21   Pickenpack-Cousar, Carlena Sax, NP  ?pantoprazole (PROTONIX) 40 MG tablet Take 1 tablet by mouth daily at 6 AM. 10/07/21   Eugenie Filler, MD  ?polyethylene glycol (MIRALAX / GLYCOLAX) 17 g packet Take 17 g by mouth daily. ?  Patient not taking: Reported on 11/27/2021 09/04/21   Mariel Aloe, MD  ?prochlorperazine (COMPAZINE) 10 MG tablet Take 1 tablet (10 mg total) by mouth every 6 (six) hours as needed for nausea or vomiting. 09/29/21   Curt Bears, MD  ?senna-docusate (SENOKOT-S) 8.6-50 MG tablet Take 1 tablet by mouth 2 (two) times daily. ?Patient not taking: Reported on 12/21/2021 10/06/21   Eugenie Filler, MD  ?tiZANidine (ZANAFLEX) 2 MG tablet Take 1 tablet (2 mg total) by mouth at bedtime as needed for muscle spasms. 12/03/21   Pickenpack-Cousar, Carlena Sax, NP   ?XARELTO 20 MG TABS tablet Take 20 mg by mouth every evening. 06/01/21   [provider]  ?   ? ?Allergies    ?Prednisone, Tetanus toxoids, Wellbutrin [bupropion], and Varenicline   ? ?Review of Systems   ?Review of Systems  ?Gastrointestinal:  Positive for vomiting.  ? ?Physical Exam ?Updated Vital Signs ?BP 138/81 (BP Location: Left Arm)   Pulse 90   Temp 98.4 ?F (36.9 ?C) (Oral)   Resp 18   Ht 5\' 9"  (1.753 m)   Wt 70.8 kg   SpO2 100%   BMI 23.04 kg/m?  ?Physical Exam ?Vitals and nursing note reviewed.  ?Constitutional:   ?   General: He is not in acute distress. ?   Appearance: He is well-developed. He is not ill-appearing.  ?HENT:  ?   Head: Normocephalic and atraumatic.  ?   Nose: Nose normal.  ?   Mouth/Throat:  ?   Mouth: Mucous membranes are moist.  ?Eyes:  ?   Extraocular Movements: Extraocular movements intact.  ?   Conjunctiva/sclera: Conjunctivae normal.  ?   Pupils: Pupils are equal, round, and reactive to light.  ?Cardiovascular:  ?   Rate and Rhythm: Normal rate and regular rhythm.  ?   Pulses: Normal pulses.  ?   Heart sounds: Normal heart sounds. No murmur heard. ?Pulmonary:  ?   Effort: Pulmonary effort is normal. No respiratory distress.  ?   Breath sounds: Normal breath sounds.  ?Abdominal:  ?   Palpations: Abdomen is soft.  ?   Tenderness: There is abdominal tenderness.  ?Musculoskeletal:     ?   General: No swelling.  ?   Cervical back: Normal range of motion and neck supple.  ?Skin: ?   General: Skin is warm and dry.  ?   Capillary Refill: Capillary refill takes less than 2 seconds.  ?Neurological:  ?   General: No focal deficit present.  ?   Mental Status: He is alert.  ?Psychiatric:     ?   Mood and Affect: Mood normal.  ? ? ?ED Results / Procedures / Treatments   ?Labs ?(all labs ordered are listed, but only abnormal results are displayed) ?Labs Reviewed  ?CBC WITH DIFFERENTIAL/PLATELET - Abnormal; Notable for the following components:  ?    Result Value  ? RBC 3.60 (*)   ?  Hemoglobin 11.8 (*)   ? HCT 34.5 (*)   ? Neutro Abs 8.3 (*)   ? Monocytes Absolute 1.2 (*)   ? All other components within normal limits  ?COMPREHENSIVE METABOLIC PANEL - Abnormal; Notable for the following components:  ? Glucose, Bld 103 (*)   ? Albumin 3.4 (*)   ? All other components within normal limits  ?LIPASE, BLOOD  ? ? ?EKG ?EKG Interpretation ? ?Date/Time:  Sunday Jan 10 2022 08:44:53 EDT ?Ventricular Rate:  100 ?PR Interval:  144 ?QRS Duration: 89 ?QT  Interval:  350 ?QTC Calculation: 452 ?R Axis:   78 ?Text Interpretation: Sinus tachycardia Left atrial enlargement Confirmed by Ronnald Nian, Adayah Arocho ([id number]) on 01/10/2022 9:19:02 AM ? ?Radiology ?CT ABDOMEN PELVIS W CONTRAST ? ?Result Date: 01/10/2022 ?CLINICAL DATA:  Abdominal pain, nausea and vomiting for 2 days, ongoing chemotherapy, metastatic lung cancer * Tracking Code: BO * EXAM: CT ABDOMEN AND PELVIS WITH CONTRAST TECHNIQUE: Multidetector CT imaging of the abdomen and pelvis was performed using the standard protocol following bolus administration of intravenous contrast. RADIATION DOSE REDUCTION: This exam was performed according to the departmental dose-optimization program which includes automated exposure control, adjustment of the mA and/or kV according to patient size and/or use of iterative reconstruction technique. CONTRAST:  164mL OMNIPAQUE IOHEXOL 300 MG/ML  SOLN COMPARISON:  12/17/2021 FINDINGS: Lower chest: Small, chronic, loculated right pleural effusion with associated pleural thickening. Coronary artery calcifications. Hepatobiliary: No solid liver abnormality is seen. No gallstones, gallbladder wall thickening, or biliary dilatation. Pancreas: Unremarkable. No pancreatic ductal dilatation or surrounding inflammatory changes. Spleen: Normal in size without significant abnormality. Adrenals/Urinary Tract: Adrenal glands are unremarkable. Kidneys are normal, without renal calculi, solid lesion, or hydronephrosis. Bladder is unremarkable.  Stomach/Bowel: Stomach is within normal limits. Appendix appears normal. No evidence of bowel wall thickening, distention, or inflammatory changes. Sigmoid diverticula. Vascular/Lymphatic: Aortic atherosclerosis. No enl

## 2022-01-10 NOTE — ED Notes (Addendum)
ED MD informed of c/o of pain and n/v ?

## 2022-01-10 NOTE — ED Notes (Addendum)
Wife at bedside.

## 2022-01-11 ENCOUNTER — Inpatient Hospital Stay (HOSPITAL_BASED_OUTPATIENT_CLINIC_OR_DEPARTMENT_OTHER): Payer: Medicare Other | Admitting: Nurse Practitioner

## 2022-01-11 ENCOUNTER — Encounter: Payer: Self-pay | Admitting: Nurse Practitioner

## 2022-01-11 ENCOUNTER — Inpatient Hospital Stay: Payer: Medicare Other

## 2022-01-11 ENCOUNTER — Other Ambulatory Visit (HOSPITAL_COMMUNITY): Payer: Self-pay

## 2022-01-11 ENCOUNTER — Inpatient Hospital Stay: Payer: Medicare Other | Attending: Physician Assistant | Admitting: Physician Assistant

## 2022-01-11 VITALS — BP 114/90 | HR 123 | Temp 98.5°F | Resp 18 | Ht 69.0 in | Wt 162.7 lb

## 2022-01-11 VITALS — BP 112/72 | HR 94 | Resp 17

## 2022-01-11 DIAGNOSIS — Z515 Encounter for palliative care: Secondary | ICD-10-CM

## 2022-01-11 DIAGNOSIS — C3491 Malignant neoplasm of unspecified part of right bronchus or lung: Secondary | ICD-10-CM

## 2022-01-11 DIAGNOSIS — C7A1 Malignant poorly differentiated neuroendocrine tumors: Secondary | ICD-10-CM | POA: Diagnosis not present

## 2022-01-11 DIAGNOSIS — Z5112 Encounter for antineoplastic immunotherapy: Secondary | ICD-10-CM | POA: Diagnosis not present

## 2022-01-11 DIAGNOSIS — G893 Neoplasm related pain (acute) (chronic): Secondary | ICD-10-CM

## 2022-01-11 DIAGNOSIS — K5903 Drug induced constipation: Secondary | ICD-10-CM

## 2022-01-11 DIAGNOSIS — R5382 Chronic fatigue, unspecified: Secondary | ICD-10-CM

## 2022-01-11 DIAGNOSIS — Z79899 Other long term (current) drug therapy: Secondary | ICD-10-CM | POA: Diagnosis not present

## 2022-01-11 DIAGNOSIS — Z7189 Other specified counseling: Secondary | ICD-10-CM

## 2022-01-11 DIAGNOSIS — R5383 Other fatigue: Secondary | ICD-10-CM

## 2022-01-11 DIAGNOSIS — R0602 Shortness of breath: Secondary | ICD-10-CM | POA: Diagnosis not present

## 2022-01-11 DIAGNOSIS — R110 Nausea: Secondary | ICD-10-CM

## 2022-01-11 DIAGNOSIS — C7B8 Other secondary neuroendocrine tumors: Secondary | ICD-10-CM | POA: Diagnosis not present

## 2022-01-11 DIAGNOSIS — R630 Anorexia: Secondary | ICD-10-CM

## 2022-01-11 DIAGNOSIS — Z95828 Presence of other vascular implants and grafts: Secondary | ICD-10-CM

## 2022-01-11 DIAGNOSIS — Z5111 Encounter for antineoplastic chemotherapy: Secondary | ICD-10-CM | POA: Diagnosis not present

## 2022-01-11 DIAGNOSIS — C7A8 Other malignant neuroendocrine tumors: Secondary | ICD-10-CM | POA: Insufficient documentation

## 2022-01-11 DIAGNOSIS — R634 Abnormal weight loss: Secondary | ICD-10-CM

## 2022-01-11 LAB — CBC WITH DIFFERENTIAL (CANCER CENTER ONLY)
Abs Immature Granulocytes: 0.06 10*3/uL (ref 0.00–0.07)
Basophils Absolute: 0.1 10*3/uL (ref 0.0–0.1)
Basophils Relative: 0 %
Eosinophils Absolute: 0.2 10*3/uL (ref 0.0–0.5)
Eosinophils Relative: 2 %
HCT: 34.7 % — ABNORMAL LOW (ref 39.0–52.0)
Hemoglobin: 11.7 g/dL — ABNORMAL LOW (ref 13.0–17.0)
Immature Granulocytes: 1 %
Lymphocytes Relative: 7 %
Lymphs Abs: 0.9 10*3/uL (ref 0.7–4.0)
MCH: 32.7 pg (ref 26.0–34.0)
MCHC: 33.7 g/dL (ref 30.0–36.0)
MCV: 96.9 fL (ref 80.0–100.0)
Monocytes Absolute: 1.2 10*3/uL — ABNORMAL HIGH (ref 0.1–1.0)
Monocytes Relative: 10 %
Neutro Abs: 9.8 10*3/uL — ABNORMAL HIGH (ref 1.7–7.7)
Neutrophils Relative %: 80 %
Platelet Count: 336 10*3/uL (ref 150–400)
RBC: 3.58 MIL/uL — ABNORMAL LOW (ref 4.22–5.81)
RDW: 14.9 % (ref 11.5–15.5)
WBC Count: 12.2 10*3/uL — ABNORMAL HIGH (ref 4.0–10.5)
nRBC: 0 % (ref 0.0–0.2)

## 2022-01-11 LAB — URINALYSIS, COMPLETE (UACMP) WITH MICROSCOPIC
Bacteria, UA: NONE SEEN
Bilirubin Urine: NEGATIVE
Glucose, UA: NEGATIVE mg/dL
Hgb urine dipstick: NEGATIVE
Ketones, ur: NEGATIVE mg/dL
Leukocytes,Ua: NEGATIVE
Nitrite: NEGATIVE
Protein, ur: 30 mg/dL — AB
Specific Gravity, Urine: 1.031 — ABNORMAL HIGH (ref 1.005–1.030)
pH: 5 (ref 5.0–8.0)

## 2022-01-11 LAB — RAPID URINE DRUG SCREEN, HOSP PERFORMED
Amphetamines: NOT DETECTED
Barbiturates: NOT DETECTED
Benzodiazepines: NOT DETECTED
Cocaine: NOT DETECTED
Opiates: POSITIVE — AB
Tetrahydrocannabinol: NOT DETECTED

## 2022-01-11 LAB — CMP (CANCER CENTER ONLY)
ALT: 13 U/L (ref 0–44)
AST: 21 U/L (ref 15–41)
Albumin: 3.8 g/dL (ref 3.5–5.0)
Alkaline Phosphatase: 95 U/L (ref 38–126)
Anion gap: 8 (ref 5–15)
BUN: 17 mg/dL (ref 8–23)
CO2: 25 mmol/L (ref 22–32)
Calcium: 9.5 mg/dL (ref 8.9–10.3)
Chloride: 104 mmol/L (ref 98–111)
Creatinine: 1.08 mg/dL (ref 0.61–1.24)
GFR, Estimated: >60 mL/min (ref >60)
Glucose, Bld: 122 mg/dL — ABNORMAL HIGH (ref 70–99)
Potassium: 3.7 mmol/L (ref 3.5–5.1)
Sodium: 137 mmol/L (ref 135–145)
Total Bilirubin: 0.6 mg/dL (ref 0.3–1.2)
Total Protein: 7.6 g/dL (ref 6.5–8.1)

## 2022-01-11 LAB — TSH: TSH: 1.006 u[IU]/mL (ref 0.350–4.500)

## 2022-01-11 MED ORDER — SODIUM CHLORIDE 0.9 % IV SOLN: Freq: Once | INTRAVENOUS | Status: AC

## 2022-01-11 MED ORDER — SODIUM CHLORIDE 0.9% FLUSH
10.0000 mL | Freq: Once | INTRAVENOUS | Status: AC
  Administered 2022-01-11: 10 mL

## 2022-01-11 MED ORDER — HEPARIN SOD (PORK) LOCK FLUSH 100 UNIT/ML IV SOLN
500.0000 [IU] | Freq: Once | INTRAVENOUS | Status: AC | PRN
  Administered 2022-01-11: 500 [IU]

## 2022-01-11 MED ORDER — ONDANSETRON HCL 8 MG PO TABS
8.0000 mg | ORAL_TABLET | Freq: Three times a day (TID) | ORAL | 2 refills | Status: AC | PRN
  Filled 2022-01-11: qty 90, 30d supply, fill #0
  Filled 2022-04-25: qty 90, 30d supply, fill #1

## 2022-01-11 MED ORDER — PROCHLORPERAZINE MALEATE 10 MG PO TABS
10.0000 mg | ORAL_TABLET | Freq: Once | ORAL | Status: AC
  Administered 2022-01-11: 10 mg via ORAL
  Filled 2022-01-11: qty 1

## 2022-01-11 MED ORDER — SODIUM CHLORIDE 0.9 % IV SOLN
500.0000 mg/m2 | Freq: Once | INTRAVENOUS | Status: AC
  Administered 2022-01-11: 1000 mg via INTRAVENOUS
  Filled 2022-01-11: qty 40

## 2022-01-11 MED ORDER — SODIUM CHLORIDE 0.9% FLUSH
10.0000 mL | INTRAVENOUS | Status: DC | PRN
  Administered 2022-01-11: 10 mL

## 2022-01-11 MED ORDER — SODIUM CHLORIDE 0.9 % IV SOLN
200.0000 mg | Freq: Once | INTRAVENOUS | Status: AC
  Administered 2022-01-11: 200 mg via INTRAVENOUS
  Filled 2022-01-11: qty 200

## 2022-01-11 MED ORDER — CYANOCOBALAMIN 1000 MCG/ML IJ SOLN
1000.0000 ug | Freq: Once | INTRAMUSCULAR | Status: AC
  Administered 2022-01-11: 1000 ug via INTRAMUSCULAR
  Filled 2022-01-11: qty 1

## 2022-01-11 NOTE — Progress Notes (Signed)
Per Cassie, ok to treat with elevated pulse of 112 ?

## 2022-01-11 NOTE — Progress Notes (Signed)
Nutrition Follow-up: ? ?Patient with stage IV large cell neuroendocrine with small metastatic brain lesion.  Patient with local recurrence in Nov 2022.  Patient currently on [place].  Noted ED visit ? ?Met with patient in infusion.  Says that he was not able to keep anything down on Saturday and went to ER on Sunday morning.  Feels a little bit better. Says that he is taking nausea medication.  Receiving fluids today.  Patient no longer taking decadron and weaning pain medications.   ? ? ? ?Medications: reviewed ? ?Labs: reviewed ? ?Anthropometrics:  ? ?Weight 162 lb today ?187 lb on 4/3 ?173 lb on 3/13 ?179 lb on 3/8 ?169 lb on 2/21 ? ?13% weight loss in the last month, significant ? ?NUTRITION DIAGNOSIS:  ?Unintentional weight loss continues ? ?INTERVENTION:  ?Encouraged taking nausea medications ?Discussed foods to consume with nausea.  Handout provided along with strategies to help.   ?Discussed ensure clear "juice type" shake to try.   ?  ? ?MONITORING, EVALUATION, GOAL: weight trends, intake ? ? ?NEXT VISIT: Tuesday, June 6 during infusion ? ?Dequan Kindred B. Zenia Resides, RD, LDN ?Registered Dietitian ?[phone] ? ? ?

## 2022-01-11 NOTE — Progress Notes (Signed)
? ?  ?Palliative Medicine ?[place]  ?Telephone:(336) [phone] [phone] ? ? ?Name: TYREES CHOPIN ?Date: 01/11/2022 ?MRN: [id number]  ?DOB: Aug 17, 1956 ? ?Patient Care Team: ?[name], MD as PCP - General (Family Medicine) ?[name], MD as PCP - Cardiology (Cardiology) ?Garner Nash, DO as Consulting Physician (Pulmonary Disease) ?Valrie Hart, RN as Oncology Nurse Navigator (Oncology) ?Maryanna Shape, NP as Nurse Practitioner (Oncology) ?Curt Bears, MD as Consulting Physician (Oncology) ?Pickenpack-Cousar, Carlena Sax, NP as Nurse Practitioner (Nurse Practitioner)  ? ? ?INTERVAL HISTORY: ?ROSWELL NDIAYE is a 66 y.o. 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 He was admitted on 08/29/2021 and 09/15/21 from home with uncontrolled intractable chest wall pain. He has completed all of his radiation treatments. Now with plans to undergo immunotherapy as recommended by [facility].  ? ?SOCIAL HISTORY:    ? reports that he quit smoking about 6 years ago. His smoking use included cigarettes. He has a 33.00 pack-year smoking history. He has never used smokeless tobacco. He reports that he does not currently use alcohol. He reports that he does not use drugs. ? ?ADVANCE DIRECTIVES:  ?None on file  ? ?CODE STATUS:  ? ?PAST MEDICAL HISTORY: ?Past Medical History:  ?Diagnosis Date  ? Anemia   ? Anxiety   ? Arthritis   ? Basal cell carcinoma (BCC) of forehead   ? CAD (coronary artery disease)   ? a. 10/2015 ant STEMI >> LHC with 3 v CAD; oLAD tx with POBA >> emergent CABG. b. Multiple evals since that time,  early graft failure of SVG-RCA by cath 03/2016. c. 2/19 PCI/DES x1 to pRCA, normal EF.  ? Carotid artery disease ([place])   ? a. 40-59% BICA 02/2018.  ? Depression   ? Dyspnea   ? Ectopic atrial tachycardia ([place])   ? Esophageal reflux   ? eosinophil esophagitis  ? Family history of adverse reaction to anesthesia   ? "sister has PONV" (06/21/2017)  ? Former tobacco use   ? Gout   ? Hepatitis C   ? "treated and cured" (06/21/2017)  ? High cholesterol   ? History of blood transfusion   ? History of kidney stones   ? Hypertension   ? Ischemic cardiomyopathy   ? a. EF 25-30% at intraop TEE 4/17  //  b. Limited Echo 5/17 - EF 45-50%, mild ant HK. c. EF 55-65% by cath 09/2017.  ? Migraine   ? "3-4/yr" (06/21/2017)  ? Myocardial infarction ([place]) 10/2015  ? Palpitations   ? Pneumonia   ? Sinus bradycardia   ? a. HR dropping into 40s in 02/2016 -> BB reduced.  ? Stroke [facility]) 10/2016  ? "small one; sometimes my memory/cognitive issues" (06/21/2017)  ? Symptomatic hypotension   ? a. 02/2016 ER visit -> meds reduced.  ? Syncope   ? Wears dentures   ? Wears glasses   ? ? ?ALLERGIES:  is allergic to prednisone, tetanus toxoids, wellbutrin [bupropion], and varenicline. ? ?MEDICATIONS:  ?Current Outpatient Medications  ?Medication Sig Dispense Refill  ? acetaminophen (TYLENOL) 500 MG tablet Take 1,000 mg by mouth every 6 (six) hours as needed for mild pain, fever or  headache.    ? aspirin EC 81 MG tablet Take 81 mg by mouth daily. Swallow whole.    ? colchicine 0.6 MG tablet Take by mouth. (Patient not taking: Reported on 11/30/2021)    ? diclofenac (VOLTAREN) 25 MG EC tablet Take by mouth.    ? docusate sodium (COLACE) 100 MG capsule Take 100-200 mg by mouth daily. (Patient not taking: Reported on 11/27/2021)    ? DULoxetine (CYMBALTA) 30 MG capsule Take 1 capsule (30 mg total) by mouth 2 (two) times daily. 60 capsule 1  ? ferrous gluconate (FERGON) 324 MG tablet Take by mouth daily.    ? folic acid (FOLVITE) 1 MG tablet Take 1 tablet (1 mg  total) by mouth daily. 30 tablet 4  ? gabapentin (NEURONTIN) 300 MG capsule Take 1 capsule (300 mg total) by mouth 3 (three) times daily. 180 capsule 2  ? HYDROmorphone (DILAUDID) 4 MG tablet Take 2 tablets (8 mg total) by mouth every 6 (six) hours as needed for severe pain. 90 tablet 0  ? lidocaine (LIDODERM) 5 % Place 1 patch onto the skin daily as needed (for pain- Remove & Discard patch within 12 hours or as directed by MD).    ? lidocaine-prilocaine (EMLA) cream Apply to the Port-A-Cath site 30-60-minute before treatment 30 g 0  ? metoprolol succinate (TOPROL-XL) 25 MG 24 hr tablet Take 1 tablet (25 mg total) by mouth daily. (Patient taking differently: Take 25 mg by mouth every evening.) 90 tablet 3  ? morphine (MS CONTIN) 60 MG 12 hr tablet Take 1 tablet by mouth every 12 hours. 60 tablet 0  ? Multiple Vitamin (MULTIVITAMIN WITH MINERALS) TABS tablet Take 1 tablet by mouth daily.    ? ondansetron (ZOFRAN) 8 MG tablet Take 1 tablet by mouth every 8 hours as needed for nausea or vomiting. Starting 3 days after chemotherapy 90 tablet 2  ? pantoprazole (PROTONIX) 40 MG tablet Take 1 tablet by mouth daily at 6 AM. 30 tablet 1  ? polyethylene glycol (MIRALAX / GLYCOLAX) 17 g packet Take 17 g by mouth daily. (Patient not taking: Reported on 11/27/2021) 14 each 0  ? prochlorperazine (COMPAZINE) 10 MG tablet Take 1 tablet (10 mg total) by mouth every 6 (six) hours as needed for nausea or vomiting. 30 tablet 0  ? senna-docusate (SENOKOT-S) 8.6-50 MG tablet Take 1 tablet by mouth 2 (two) times daily. (Patient not taking: Reported on 12/21/2021)    ? tiZANidine (ZANAFLEX) 2 MG tablet Take 1 tablet (2 mg total) by mouth at bedtime as needed for muscle spasms. 30 tablet 2  ? XARELTO 20 MG TABS tablet Take 20 mg by mouth every evening.    ? ?No current facility-administered medications for this visit.  ? ?Facility-Administered Medications Ordered in Other Visits  ?Medication Dose Route Frequency Provider Last Rate Last Admin   ? sodium chloride flush (NS) 0.9 % injection 10 mL  10 mL Intracatheter PRN Curt Bears, MD   10 mL at 01/11/22 1438  ? ? ?VITAL SIGNS: ?There were no vitals taken for this visit. ?There were no vitals filed for this visit. ?  ?Estimated body mass index is 24.03 kg/m? as calculated from the following: ?  Height as of an earlier encounter on 01/11/22: 5\' 9"  (1.753 m). ?  Weight as of an earlier encounter on 01/11/22: 162 lb 11.2 oz (73.8 kg). ? ? ?PERFORMANCE STATUS (ECOG) : 1 - Symptomatic but completely ambulatory ? ? ?Physical Exam ?General: NAD, thin  ?  Pulmonary: normal breathing pattern  ?Cardio: Tachycardic  ?Neurological: AAOx 4, appears comfortable ? ?IMPRESSION: ? ?Mr. Ambrosino presents to the clinic today for symptom management follow-up. He was seen in the ED on yesterday due to ongoing nausea and vomiting. All work-up negative. He was unable to provide urine specimen. Will obtain today and make sure all is clear. He is concerned that his symptoms are related to withdrawals in the setting of weaning down on his pain regimen. Education provided on weaning process and timing to hopefully prevent any withdrawals. Continues on all medications with only decrease in dosing which should not cause significant withdrawals. However he did choose to try to suddenly discontinue use of his dilaudid for 3 days a week ago. He has since followed recommendations and detailed regimen.  ? ?Of note prior to wean Mr. Noone had an ER visit to [facility] with complaints of bilateral lower extremity swelling, abdominal pain, and nausea/vomiting x2 weeks. Work-up completed with no acute changes or abnormalities found. He was able to discharge home by private vehicle at that time.  ? ?Neoplasm related pain ?Reports pain is a 2-3 today. Pain seems controlled on current regimen. Reemphasized weaning process and goal of decrease opioid use and frequency to not only prevent secondary symptoms but also in the setting of  decreased pain appreciative of chemo/immunotherapy treatment and previous radiation treatment.  He verbalized understanding although expressed feelings of "going to fast! Advised of weaning process whi

## 2022-01-11 NOTE — Progress Notes (Signed)
Per Cassie Heilingoetter, PA ok to give NS during chemo infusion. Infusion RN aware ?

## 2022-01-12 ENCOUNTER — Other Ambulatory Visit (HOSPITAL_COMMUNITY): Payer: Self-pay

## 2022-01-14 ENCOUNTER — Emergency Department (HOSPITAL_BASED_OUTPATIENT_CLINIC_OR_DEPARTMENT_OTHER): Payer: Medicare Other

## 2022-01-14 ENCOUNTER — Other Ambulatory Visit: Payer: Self-pay

## 2022-01-14 ENCOUNTER — Encounter (HOSPITAL_BASED_OUTPATIENT_CLINIC_OR_DEPARTMENT_OTHER): Payer: Self-pay

## 2022-01-14 ENCOUNTER — Telehealth: Payer: Self-pay

## 2022-01-14 ENCOUNTER — Other Ambulatory Visit (HOSPITAL_COMMUNITY): Payer: Self-pay

## 2022-01-14 ENCOUNTER — Telehealth: Payer: Self-pay | Admitting: *Deleted

## 2022-01-14 ENCOUNTER — Emergency Department (HOSPITAL_BASED_OUTPATIENT_CLINIC_OR_DEPARTMENT_OTHER)
Admission: EM | Admit: 2022-01-14 | Discharge: 2022-01-14 | Disposition: A | Discharge status: Home / Self Care | Payer: Medicare Other | Attending: Emergency Medicine | Admitting: Emergency Medicine

## 2022-01-14 ENCOUNTER — Inpatient Hospital Stay: Admission: RE | Admit: 2022-01-14 | Payer: Medicare Other | Source: Ambulatory Visit

## 2022-01-14 DIAGNOSIS — K920 Hematemesis: Secondary | ICD-10-CM | POA: Diagnosis present

## 2022-01-14 DIAGNOSIS — R0682 Tachypnea, not elsewhere classified: Secondary | ICD-10-CM | POA: Diagnosis not present

## 2022-01-14 DIAGNOSIS — Z7901 Long term (current) use of anticoagulants: Secondary | ICD-10-CM | POA: Diagnosis not present

## 2022-01-14 DIAGNOSIS — Z7982 Long term (current) use of aspirin: Secondary | ICD-10-CM | POA: Insufficient documentation

## 2022-01-14 DIAGNOSIS — K529 Noninfective gastroenteritis and colitis, unspecified: Secondary | ICD-10-CM | POA: Diagnosis not present

## 2022-01-14 DIAGNOSIS — R000 Tachycardia, unspecified: Secondary | ICD-10-CM | POA: Diagnosis not present

## 2022-01-14 DIAGNOSIS — R112 Nausea with vomiting, unspecified: Secondary | ICD-10-CM

## 2022-01-14 HISTORY — DX: Malignant poorly differentiated neuroendocrine tumors: C7A.1

## 2022-01-14 HISTORY — DX: Other malignant neuroendocrine tumors: C7A.8

## 2022-01-14 LAB — CBC WITH DIFFERENTIAL/PLATELET
Abs Immature Granulocytes: 0.06 10*3/uL (ref 0.00–0.07)
Basophils Absolute: 0 10*3/uL (ref 0.0–0.1)
Basophils Relative: 0 %
Eosinophils Absolute: 0 10*3/uL (ref 0.0–0.5)
Eosinophils Relative: 0 %
HCT: 33.3 % — ABNORMAL LOW (ref 39.0–52.0)
Hemoglobin: 11.2 g/dL — ABNORMAL LOW (ref 13.0–17.0)
Immature Granulocytes: 1 %
Lymphocytes Relative: 3 %
Lymphs Abs: 0.4 10*3/uL — ABNORMAL LOW (ref 0.7–4.0)
MCH: 32.8 pg (ref 26.0–34.0)
MCHC: 33.6 g/dL (ref 30.0–36.0)
MCV: 97.7 fL (ref 80.0–100.0)
Monocytes Absolute: 0.2 10*3/uL (ref 0.1–1.0)
Monocytes Relative: 2 %
Neutro Abs: 11 10*3/uL — ABNORMAL HIGH (ref 1.7–7.7)
Neutrophils Relative %: 94 %
Platelets: 250 10*3/uL (ref 150–400)
RBC: 3.41 MIL/uL — ABNORMAL LOW (ref 4.22–5.81)
RDW: 15.1 % (ref 11.5–15.5)
WBC: 11.6 10*3/uL — ABNORMAL HIGH (ref 4.0–10.5)
nRBC: 0 % (ref 0.0–0.2)

## 2022-01-14 LAB — URINALYSIS, ROUTINE W REFLEX MICROSCOPIC
Bilirubin Urine: NEGATIVE
Glucose, UA: NEGATIVE mg/dL
Hgb urine dipstick: NEGATIVE
Ketones, ur: NEGATIVE mg/dL
Leukocytes,Ua: NEGATIVE
Nitrite: NEGATIVE
Protein, ur: NEGATIVE mg/dL
Specific Gravity, Urine: 1.015 (ref 1.005–1.030)
pH: 7 (ref 5.0–8.0)

## 2022-01-14 LAB — COMPREHENSIVE METABOLIC PANEL
ALT: 15 U/L (ref 0–44)
AST: 28 U/L (ref 15–41)
Albumin: 3.2 g/dL — ABNORMAL LOW (ref 3.5–5.0)
Alkaline Phosphatase: 97 U/L (ref 38–126)
Anion gap: 8 (ref 5–15)
BUN: 13 mg/dL (ref 8–23)
CO2: 23 mmol/L (ref 22–32)
Calcium: 9.2 mg/dL (ref 8.9–10.3)
Chloride: 102 mmol/L (ref 98–111)
Creatinine, Ser: 0.87 mg/dL (ref 0.61–1.24)
GFR, Estimated: >60 mL/min (ref >60)
Glucose, Bld: 110 mg/dL — ABNORMAL HIGH (ref 70–99)
Potassium: 4.2 mmol/L (ref 3.5–5.1)
Sodium: 133 mmol/L — ABNORMAL LOW (ref 135–145)
Total Bilirubin: 1 mg/dL (ref 0.3–1.2)
Total Protein: 7.4 g/dL (ref 6.5–8.1)

## 2022-01-14 LAB — MAGNESIUM: Magnesium: 1.9 mg/dL (ref 1.7–2.4)

## 2022-01-14 LAB — LACTIC ACID, PLASMA: Lactic Acid, Venous: 1.5 mmol/L (ref 0.5–1.9)

## 2022-01-14 LAB — PROTIME-INR
INR: 1.4 — ABNORMAL HIGH (ref 0.8–1.2)
Prothrombin Time: 17.3 seconds — ABNORMAL HIGH (ref 11.4–15.2)

## 2022-01-14 MED ORDER — HYDROMORPHONE HCL 1 MG/ML IJ SOLN
1.0000 mg | Freq: Once | INTRAMUSCULAR | Status: AC
  Administered 2022-01-14: 1 mg via INTRAVENOUS
  Filled 2022-01-14: qty 1

## 2022-01-14 MED ORDER — SODIUM CHLORIDE 0.9 % IV BOLUS
1000.0000 mL | Freq: Once | INTRAVENOUS | Status: AC
Start: 2022-01-14 — End: 2022-01-14
  Administered 2022-01-14: 1000 mL via INTRAVENOUS

## 2022-01-14 MED ORDER — DIPHENHYDRAMINE HCL 50 MG/ML IJ SOLN
12.5000 mg | Freq: Once | INTRAMUSCULAR | Status: AC
  Administered 2022-01-14: 12.5 mg via INTRAVENOUS
  Filled 2022-01-14: qty 1

## 2022-01-14 MED ORDER — METOCLOPRAMIDE HCL 10 MG PO TABS: 10.0000 mg | ORAL_TABLET | Freq: Four times a day (QID) | ORAL | 0 refills | Status: DC

## 2022-01-14 MED ORDER — ONDANSETRON HCL 4 MG/2ML IJ SOLN
4.0000 mg | Freq: Once | INTRAMUSCULAR | Status: AC
  Administered 2022-01-14: 4 mg via INTRAVENOUS
  Filled 2022-01-14: qty 2

## 2022-01-14 MED ORDER — METOCLOPRAMIDE HCL 5 MG/ML IJ SOLN
10.0000 mg | Freq: Once | INTRAMUSCULAR | Status: AC
  Administered 2022-01-14: 10 mg via INTRAVENOUS
  Filled 2022-01-14: qty 2

## 2022-01-14 MED ORDER — DIPHENHYDRAMINE HCL 25 MG PO TABS: 25.0000 mg | ORAL_TABLET | Freq: Four times a day (QID) | ORAL | 0 refills | Status: DC | PRN

## 2022-01-14 MED ORDER — IOHEXOL 300 MG/ML  SOLN
100.0000 mL | Freq: Once | INTRAMUSCULAR | Status: AC | PRN
  Administered 2022-01-14: 100 mL via INTRAVENOUS

## 2022-01-14 NOTE — Discharge Instructions (Signed)
Today you were offered admission for colitis.  To tolerate liquids and food after treated with Reglan and Benadryl for nausea and vomiting.  For any reason you go home and have worsening of symptoms despite Reglan and Benadryl please return to emergency department.

## 2022-01-14 NOTE — Telephone Encounter (Signed)
Spoke with the patient who reports that he is not feeling well.  He states he has been having headaches daily for about 2 weeks and increased nausea.  He reports throwing up rust and white colored blood since yesterday.  Denies coffee ground emesis.  He states his stomach hurts and he has been having a low grade fever for the last couple of days.  He was seen in the ER on Sunday and CT AP was obtained and revealed no acute findings of the abdomen or pelvis to explain abdominal pain, nausea, or vomiting.  He received IV fluids at that time as well.  Recommendations for the Doctor/PA are to go to the ER for evaluation.  Patient states he was due for MRI scan today and was unable to go due to his symptoms and had to reschedule for the 30th.  He as informed that his scan could be moved up if he is admitted to the hospital, no guarantee of this.  He verbalized understanding.    Gloriajean Dell. Leonie Green, BSN

## 2022-01-14 NOTE — ED Notes (Signed)
Pt given clear soda for PO challenge. RN will continue to monitor.

## 2022-01-14 NOTE — ED Provider Notes (Signed)
[facility] EMERGENCY DEPARTMENT Provider Note   CSN: [id number] Arrival date & time: 01/14/22  1119     History  Chief Complaint  Patient presents with   Hematemesis    [name] is a 66 y.o. male.  Patient is a 66 year old male with past medical history of lung disease, stage IV with mets to the brain and chest wall presenting for complaints of hematemesis.  Patient states he had nausea and vomiting for 8 days.  Currently on chemotherapy.  States this morning he saw blood tinged mucus in his vomit.  Endorses generalized abdominal pain.  No diarrhea.  The history is provided by the patient. No language interpreter was used.      Home Medications Prior to Admission medications   Medication Sig Start Date End Date Taking? Authorizing Provider  diphenhydrAMINE (BENADRYL) 25 MG tablet Take 1 tablet (25 mg total) by mouth every 6 (six) hours as needed. 04/27/99  Yes Campbell Stall P, DO  metoCLOPramide (REGLAN) 10 MG tablet Take 1 tablet (10 mg total) by mouth every 6 (six) hours. 3/49/17  Yes Campbell Stall P, DO  acetaminophen (TYLENOL) 500 MG tablet Take 1,000 mg by mouth every 6 (six) hours as needed for mild pain, fever or headache.    [provider]  aspirin EC 81 MG tablet Take 81 mg by mouth daily. Swallow whole.    [provider]  colchicine 0.6 MG tablet Take by mouth. Patient not taking: Reported on 11/30/2021 11/17/21   [provider]  diclofenac (VOLTAREN) 25 MG EC tablet Take by mouth. 11/01/21   [provider]  docusate sodium (COLACE) 100 MG capsule Take 100-200 mg by mouth daily. Patient not taking: Reported on 11/27/2021    [provider]  DULoxetine (CYMBALTA) 30 MG capsule Take 1 capsule (30 mg total) by mouth 2 (two) times daily. 12/04/21   Pickenpack-Cousar, Carlena Sax, NP  ferrous gluconate (FERGON) 324 MG tablet Take by mouth daily. 10/28/21   [provider]  folic acid (FOLVITE) 1 MG tablet Take 1  tablet (1 mg total) by mouth daily. 09/17/21   Curt Bears, MD  gabapentin (NEURONTIN) 300 MG capsule Take 1 capsule (300 mg total) by mouth 3 (three) times daily. 12/21/21   Pickenpack-Cousar, Carlena Sax, NP  HYDROmorphone (DILAUDID) 4 MG tablet Take 2 tablets (8 mg total) by mouth every 6 (six) hours as needed for severe pain. 01/09/22   Pickenpack-Cousar, Carlena Sax, NP  lidocaine (LIDODERM) 5 % Place 1 patch onto the skin daily as needed (for pain- Remove & Discard patch within 12 hours or as directed by MD).    [provider]  lidocaine-prilocaine (EMLA) cream Apply to the Port-A-Cath site 30-60-minute before treatment 11/09/21   Curt Bears, MD  metoprolol succinate (TOPROL-XL) 25 MG 24 hr tablet Take 1 tablet (25 mg total) by mouth daily. Patient taking differently: Take 25 mg by mouth every evening. 08/27/20   [name], MD  morphine (MS CONTIN) 60 MG 12 hr tablet Take 1 tablet by mouth every 12 hours. 12/28/21   Pickenpack-Cousar, Carlena Sax, NP  Multiple Vitamin (MULTIVITAMIN WITH MINERALS) TABS tablet Take 1 tablet by mouth daily.    [provider]  ondansetron (ZOFRAN) 8 MG tablet Take 1 tablet by mouth every 8 hours as needed for nausea or vomiting. Starting 3 days after chemotherapy 01/11/22   Pickenpack-Cousar, Carlena Sax, NP  pantoprazole (PROTONIX) 40 MG tablet Take 1 tablet by mouth daily at  6 AM. 10/07/21   Eugenie Filler, MD  polyethylene glycol (MIRALAX / GLYCOLAX) 17 g packet Take 17 g by mouth daily. Patient not taking: Reported on 11/27/2021 09/04/21   Mariel Aloe, MD  senna-docusate (SENOKOT-S) 8.6-50 MG tablet Take 1 tablet by mouth 2 (two) times daily. Patient not taking: Reported on 12/21/2021 10/06/21   Eugenie Filler, MD  tiZANidine (ZANAFLEX) 2 MG tablet Take 1 tablet (2 mg total) by mouth at bedtime as needed for muscle spasms. 12/03/21   Pickenpack-Cousar, Carlena Sax, NP  XARELTO 20 MG TABS tablet Take 20 mg by mouth every evening. 06/01/21    [provider]      Allergies    Prednisone, Tetanus toxoids, Wellbutrin [bupropion], and Varenicline    Review of Systems   Review of Systems  Constitutional:  Negative for chills and fever.  HENT:  Negative for ear pain and sore throat.   Eyes:  Negative for pain and visual disturbance.  Respiratory:  Negative for cough and shortness of breath.   Cardiovascular:  Negative for chest pain and palpitations.  Gastrointestinal:  Positive for abdominal pain, nausea and vomiting.  Genitourinary:  Negative for dysuria and hematuria.  Musculoskeletal:  Negative for arthralgias and back pain.  Skin:  Negative for color change and rash.  Neurological:  Negative for seizures and syncope.  All other systems reviewed and are negative.  Physical Exam Updated Vital Signs BP 117/70   Pulse 98   Temp 99 F (37.2 C) (Oral)   Resp 18   Ht 5\' 9"  (1.753 m)   Wt 72.6 kg   SpO2 99%   BMI 23.63 kg/m  Physical Exam Vitals and nursing note reviewed.  Constitutional:      General: He is not in acute distress.    Appearance: He is well-developed.  HENT:     Head: Normocephalic and atraumatic.     Mouth/Throat:     Lips: Pink.     Mouth: Mucous membranes are dry.     Pharynx: Oropharynx is clear.  Eyes:     Conjunctiva/sclera: Conjunctivae normal.  Cardiovascular:     Rate and Rhythm: Regular rhythm. Tachycardia present.     Heart sounds: No murmur heard. Pulmonary:     Effort: Pulmonary effort is normal. Tachypnea present. No respiratory distress.     Breath sounds: Normal breath sounds.  Abdominal:     Palpations: Abdomen is soft.     Tenderness: There is generalized abdominal tenderness. There is no guarding or rebound.  Musculoskeletal:        General: No swelling.     Cervical back: Neck supple.  Skin:    General: Skin is warm and dry.     Capillary Refill: Capillary refill takes less than 2 seconds.  Neurological:     Mental Status: He is alert.  Psychiatric:         Mood and Affect: Mood normal.    ED Results / Procedures / Treatments   Labs (all labs ordered are listed, but only abnormal results are displayed) Labs Reviewed  COMPREHENSIVE METABOLIC PANEL - Abnormal; Notable for the following components:      Result Value   Sodium 133 (*)    Glucose, Bld 110 (*)    Albumin 3.2 (*)    All other components within normal limits  CBC WITH DIFFERENTIAL/PLATELET - Abnormal; Notable for the following components:   WBC 11.6 (*)    RBC 3.41 (*)    Hemoglobin 11.2 (*)  HCT 33.3 (*)    Neutro Abs 11.0 (*)    Lymphs Abs 0.4 (*)    All other components within normal limits  PROTIME-INR - Abnormal; Notable for the following components:   Prothrombin Time 17.3 (*)    INR 1.4 (*)    All other components within normal limits  CULTURE, BLOOD (ROUTINE X 2)  CULTURE, BLOOD (ROUTINE X 2)  LACTIC ACID, PLASMA  URINALYSIS, ROUTINE W REFLEX MICROSCOPIC  MAGNESIUM    EKG None  Radiology CT ABDOMEN PELVIS W CONTRAST  Result Date: 01/14/2022 CLINICAL DATA:  Abdominal pain, acute, nonlocalized. History of non-small cell lung cancer EXAM: CT ABDOMEN AND PELVIS WITH CONTRAST TECHNIQUE: Multidetector CT imaging of the abdomen and pelvis was performed using the standard protocol following bolus administration of intravenous contrast. RADIATION DOSE REDUCTION: This exam was performed according to the departmental dose-optimization program which includes automated exposure control, adjustment of the mA and/or kV according to patient size and/or use of iterative reconstruction technique. CONTRAST:  126mL OMNIPAQUE IOHEXOL 300 MG/ML  SOLN COMPARISON:  CT 01/10/2022, 12/17/2021, 10/02/2021 FINDINGS: Lower chest: Chronic small right pleural effusion with associated right pleural thickening. Patchy right lower lobe airspace opacities. Multiple small pulmonary nodules within the left lung base stable in appearance from prior and compatible with known metastatic disease.  Hepatobiliary: No solid liver abnormality is seen. No gallstones, gallbladder wall thickening, or biliary dilatation. Pancreas: Unremarkable. No pancreatic ductal dilatation or surrounding inflammatory changes. Spleen: Normal in size without focal abnormality. Adrenals/Urinary Tract: Adrenal glands are unremarkable. Kidneys are normal, without renal calculi, solid lesion, or hydronephrosis. Bladder is unremarkable. Stomach/Bowel: Long segment circumferential wall thickening of the ascending and proximal transverse colon with mild pericolonic fat stranding. Normal appendix in the right lower quadrant (series 2, image 59). No dilated loops of bowel. Stomach within normal limits. Vascular/Lymphatic: Aortic atherosclerosis. No enlarged abdominal or pelvic lymph nodes. Reproductive: No mass or other significant abnormality. Other: No free fluid. No abdominopelvic fluid collection. No pneumoperitoneum. No abdominal wall hernia. Musculoskeletal: Chronic deformities of the posterior right eleventh and twelfth ribs, unchanged. No new focal bony abnormality. Focal areas of nodularity within the soft tissues of the posterior lower right chest wall posterior to the right eleventh and twelfth ribs at least partially within the erector spinae musculature (series 2, images 29-32). These have decreased in size compared to 10/02/2021. IMPRESSION: 1. Long segment circumferential wall thickening of the ascending and proximal transverse colon with mild pericolonic fat stranding, suggestive of an infectious or inflammatory colitis. 2. Chronic small right pleural effusion with associated right pleural thickening. Patchy right lower lobe airspace opacities which could represent atelectasis and/or pneumonia. 3. Multiple small pulmonary nodules within the left lung base stable in appearance from prior and compatible with known metastatic disease. 4. Focal areas of soft tissue nodularity of the posterior lower right chest wall, stable in  appearance from prior and decreased in size compared to 10/02/2021. Aortic Atherosclerosis (ICD10-I70.0). Electronically Signed   By: Davina Poke D.O.   On: 01/14/2022 14:52    Procedures Procedures    Medications Ordered in ED Medications  sodium chloride 0.9 % bolus 1,000 mL (0 mLs Intravenous Stopped 01/14/22 1300)  HYDROmorphone (DILAUDID) injection 1 mg (1 mg Intravenous Given 01/14/22 1344)  iohexol (OMNIPAQUE) 300 MG/ML solution 100 mL (100 mLs Intravenous Contrast Given 01/14/22 1418)  ondansetron (ZOFRAN) injection 4 mg (4 mg Intravenous Given 01/14/22 1452)  metoCLOPramide (REGLAN) injection 10 mg (10 mg Intravenous Given 01/14/22 1454)  diphenhydrAMINE (  BENADRYL) injection 12.5 mg (12.5 mg Intravenous Given 01/14/22 1454)    ED Course/ Medical Decision Making/ A&P                           Medical Decision Making Amount and/or Complexity of Data Reviewed Labs: ordered. Radiology: ordered.  Risk OTC drugs. Prescription drug management.   92:47 PM  66 year old male with past medical history of lung disease, stage IV with mets to the brain and chest wall presenting for complaints of hematemesis that occurred after several episodes of vomiting over the past 8 days and generalized abdominal pain.  Patient is alert and oriented x3, no acute distress, afebrile, stable vital signs.  Minimal tachycardia 102.  Normal sinus rhythm.  Generalized abdominal tenderness with guarding on exam.  No pallor.  Stable blood pressure.  Stable hemoglobin.  No blood in oropharynx.  Hematemesis likely secondary to Mallory-Weiss tear from multiple days of vomiting. Vomiting likey secondary to chemotherapy. Patient had minimal improvement with Zofran at home. Reglan and benadryl given.    Patient tachycardic on arrival likely secondary to vomiting and pain.  Dilaudid given with IV fluids and patient had improvement of blood pressure and vitals.  No fever emergency department today.  Patient stated  for 5 hours with no repeat fever.  CT abdomen demonstrates inflammatory versus infectious colitis.  Due to history of fevers at home as well as nausea and vomiting we will treat with antibiotics.  Patient offered inpatient stay however declined and requesting discharge at this time.  Patient highly recommended to return to emergency department immediately for any worsening signs or symptoms for admission and IV antibiotics.  Reevaluation demonstrates improvement of symptoms after Reglan and Benadryl.  Patient in no distress and overall condition improved here in the ED. Detailed discussions were had with the patient regarding current findings, and need for close f/u with PCP or on call doctor. The patient has been instructed to return immediately if the symptoms worsen in any way for re-evaluation. Patient verbalized understanding and is in agreement with current care plan. All questions answered prior to discharge.        Final Clinical Impression(s) / ED Diagnoses Final diagnoses:  Colitis  Nausea and vomiting, unspecified vomiting type    Rx / DC Orders ED Discharge Orders          Ordered    metoCLOPramide (REGLAN) 10 MG tablet  Every 6 hours        01/14/22 1541    diphenhydrAMINE (BENADRYL) 25 MG tablet  Every 6 hours PRN        01/14/22 1541              Lianne Cure, DO 73/56/70 1357

## 2022-01-14 NOTE — Telephone Encounter (Signed)
Pt called stating he is vomiting "lots of blood" and wants to know if she should go to the ER. Pt was advised to get to his nearest ER for evaluation. He expressed understanding of this information.

## 2022-01-14 NOTE — ED Triage Notes (Signed)
Pt presents with a hx of stage IV lung cancer with mets to the brain and chest wall. Pt has had N/V x 8 days and was seen here on 5/14.  Today pt has had a 1 day hx of hematemesis, and fever with TMax of 100.5. Pt last had chemo on  5/15.

## 2022-01-18 ENCOUNTER — Ambulatory Visit: Payer: Medicare Other | Admitting: Radiation Oncology

## 2022-01-18 ENCOUNTER — Telehealth: Payer: Self-pay

## 2022-01-18 ENCOUNTER — Inpatient Hospital Stay: Payer: Medicare Other

## 2022-01-18 NOTE — Telephone Encounter (Signed)
Pt called stating he called over the weekend and Dr. [name] was on-call and was advised he can come in to be seen today. Pt states "I feel a lot better and don't need to come in".

## 2022-01-20 LAB — CULTURE, BLOOD (ROUTINE X 2)
Culture: NO GROWTH
Culture: NO GROWTH
Special Requests: ADEQUATE
Special Requests: ADEQUATE

## 2022-01-21 ENCOUNTER — Other Ambulatory Visit (HOSPITAL_COMMUNITY): Payer: Self-pay

## 2022-01-21 ENCOUNTER — Other Ambulatory Visit: Payer: Self-pay | Admitting: Nurse Practitioner

## 2022-01-21 MED ORDER — MORPHINE SULFATE ER 60 MG PO TBCR
60.0000 mg | EXTENDED_RELEASE_TABLET | Freq: Two times a day (BID) | ORAL | 0 refills | Status: DC
  Filled 2022-02-02: qty 60, 30d supply, fill #0

## 2022-01-21 MED ORDER — HYDROMORPHONE HCL 4 MG PO TABS
8.0000 mg | ORAL_TABLET | Freq: Four times a day (QID) | ORAL | 0 refills | Status: DC | PRN
Start: 2022-01-22 — End: 2022-02-02
  Filled 2022-01-22: qty 90, 12d supply, fill #0

## 2022-01-22 ENCOUNTER — Other Ambulatory Visit (HOSPITAL_COMMUNITY): Payer: Self-pay

## 2022-01-23 ENCOUNTER — Other Ambulatory Visit (HOSPITAL_COMMUNITY): Payer: Self-pay

## 2022-01-26 ENCOUNTER — Ambulatory Visit
Admission: RE | Admit: 2022-01-26 | Discharge: 2022-01-26 | Disposition: A | Discharge status: Home / Self Care | Payer: Medicare Other | Source: Ambulatory Visit | Attending: Radiation Oncology | Admitting: Radiation Oncology

## 2022-01-26 DIAGNOSIS — Z95828 Presence of other vascular implants and grafts: Secondary | ICD-10-CM

## 2022-01-26 DIAGNOSIS — C7931 Secondary malignant neoplasm of brain: Secondary | ICD-10-CM

## 2022-01-26 MED ORDER — HEPARIN SOD (PORK) LOCK FLUSH 100 UNIT/ML IV SOLN
500.0000 [IU] | Freq: Once | INTRAVENOUS | Status: AC
  Administered 2022-01-26: 500 [IU] via INTRAVENOUS

## 2022-01-26 MED ORDER — GADOBENATE DIMEGLUMINE 529 MG/ML IV SOLN
15.0000 mL | Freq: Once | INTRAVENOUS | Status: AC | PRN
  Administered 2022-01-26: 15 mL via INTRAVENOUS

## 2022-01-26 MED ORDER — SODIUM CHLORIDE 0.9% FLUSH
10.0000 mL | INTRAVENOUS | Status: DC | PRN
  Administered 2022-01-26: 10 mL via INTRAVENOUS

## 2022-01-28 ENCOUNTER — Inpatient Hospital Stay: Payer: Medicare Other | Attending: Physician Assistant | Admitting: Internal Medicine

## 2022-01-28 ENCOUNTER — Other Ambulatory Visit: Payer: Self-pay

## 2022-01-28 VITALS — BP 101/84 | HR 120 | Temp 97.9°F | Resp 20 | Wt 156.3 lb

## 2022-01-28 DIAGNOSIS — C7931 Secondary malignant neoplasm of brain: Secondary | ICD-10-CM | POA: Diagnosis not present

## 2022-01-28 DIAGNOSIS — Z5112 Encounter for antineoplastic immunotherapy: Secondary | ICD-10-CM | POA: Insufficient documentation

## 2022-01-28 DIAGNOSIS — C7A8 Other malignant neuroendocrine tumors: Secondary | ICD-10-CM | POA: Diagnosis not present

## 2022-01-28 DIAGNOSIS — Z79899 Other long term (current) drug therapy: Secondary | ICD-10-CM | POA: Insufficient documentation

## 2022-01-28 DIAGNOSIS — C7B8 Other secondary neuroendocrine tumors: Secondary | ICD-10-CM | POA: Insufficient documentation

## 2022-01-28 DIAGNOSIS — Z5111 Encounter for antineoplastic chemotherapy: Secondary | ICD-10-CM | POA: Insufficient documentation

## 2022-01-28 NOTE — Progress Notes (Signed)
[place] [facility] [place], [place] [address] [phone]   Interval Evaluation  Date of Service: 01/28/22 Patient Name: [name] Patient MRN: [id number] Patient DOB: May 19, 1956 Provider: Ventura Sellers, MD  Identifying Statement:  [name] is a 66 y.o. 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[place])    02/09/2021 - 04/24/2021 Chemotherapy   Patient is on Treatment Plan : LUNG SMALL CELL - LIMITED STAGE Cisplatin D1 + Etoposide D1-3 q21d      09/29/2021 -  Chemotherapy   Patient is on Treatment Plan : LUNG Carboplatin (5) + Pemetrexed (500) + Pembrolizumab (200) D1 q21d Induction x 4 cycles / Maintenance Pemetrexed (500) + Pembrolizumab (200) D1 q21d      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[place])  01/14/2021 Initial Diagnosis   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[place])    01/29/2021 Cancer Staging   Staging form: Lung, AJCC 8th Edition - Clinical: Stage IVA (cT1c, cN1, cM1b) - Signed by Curt Bears, MD on 01/29/2021    09/29/2021 -  Chemotherapy   Patient is on Treatment Plan : LUNG Carboplatin (5) + Pemetrexed (500) + Pembrolizumab (200) D1 q21d Induction x 4 cycles / Maintenance Pemetrexed (500) + Pembrolizumab (200) D1 q21d       CNS Oncologic History 02/18/21: SRS to solitary brain met Lisbeth Renshaw) 08/26/21: Salvage SRS x4 Tammi Klippel) 10/27/21: Salvage SRS R thalamus Lisbeth Renshaw)  Interval History: [name] presents for follow up after recent MRI brain.  He describes no new or progressive deficits today.  His left sided numbness has actually improved, now a rare occurrence.  No seizures or headaches.  Continues on Alimta and Keytruda with Dr. [name].  H+P (10/02/21) Patient presents to review  neurologic complaints, following recent salvage SRS.  He describes episodes or sustained periods of numbness, tingling affecting the "little two fingers on my left hand", and radiating up the arm.  At times the "outside part of the left foot" is also numb and tingling.  No weakness, no shaking described, though episodes are occurring more frequenly in recent weeks.  He does also complain of "sudden jerking movements" involving whole body, often when drowsy or approaching sleep.  This has been occurring almost daily.  No post event confusion, weakness.  Resumed chemotherapy this week with Dr. [name] for systemic progression.  Medications: Current Outpatient Medications on File Prior to Visit  Medication Sig Dispense Refill   acetaminophen (TYLENOL) 500 MG tablet Take 1,000 mg by mouth every 6 (six) hours as needed for mild pain, fever or headache.     aspirin EC 81 MG tablet Take 81 mg by mouth daily. Swallow whole.     colchicine 0.6 MG tablet Take by mouth. (Patient not taking: Reported on 11/30/2021)     diclofenac (VOLTAREN) 25 MG EC tablet Take by mouth.     diphenhydrAMINE (BENADRYL) 25 MG tablet Take 1 tablet (25 mg total) by mouth every 6 (six) hours as needed. 30 tablet 0   docusate sodium (COLACE) 100 MG capsule Take 100-200 mg by mouth daily. (Patient not taking: Reported on 11/27/2021)     DULoxetine (CYMBALTA) 30 MG capsule Take 1 capsule (30 mg total) by mouth 2 (two) times daily. 60 capsule 1   ferrous gluconate (FERGON) 324 MG tablet Take  by mouth daily.     folic acid (FOLVITE) 1 MG tablet Take 1 tablet (1 mg total) by mouth daily. 30 tablet 4   gabapentin (NEURONTIN) 300 MG capsule Take 1 capsule (300 mg total) by mouth 3 (three) times daily. 180 capsule 2   HYDROmorphone (DILAUDID) 4 MG tablet Take 2 tablets by mouth every 6 hours as needed for severe pain. (01/22/22) 90 tablet 0   lidocaine (LIDODERM) 5 % Place 1 patch onto the skin daily as needed (for pain- Remove & Discard patch  within 12 hours or as directed by MD).     lidocaine-prilocaine (EMLA) cream Apply to the Port-A-Cath site 30-60-minute before treatment 30 g 0   metoCLOPramide (REGLAN) 10 MG tablet Take 1 tablet (10 mg total) by mouth every 6 (six) hours. 30 tablet 0   metoprolol succinate (TOPROL-XL) 25 MG 24 hr tablet Take 1 tablet (25 mg total) by mouth daily. (Patient taking differently: Take 25 mg by mouth every evening.) 90 tablet 3   [START ON 01/29/2022] morphine (MS CONTIN) 60 MG 12 hr tablet Take 1 tablet by mouth every 12 hours. (01/29/22) 60 tablet 0   Multiple Vitamin (MULTIVITAMIN WITH MINERALS) TABS tablet Take 1 tablet by mouth daily.     ondansetron (ZOFRAN) 8 MG tablet Take 1 tablet by mouth every 8 hours as needed for nausea or vomiting. Starting 3 days after chemotherapy 90 tablet 2   pantoprazole (PROTONIX) 40 MG tablet Take 1 tablet by mouth daily at 6 AM. 30 tablet 1   polyethylene glycol (MIRALAX / GLYCOLAX) 17 g packet Take 17 g by mouth daily. (Patient not taking: Reported on 11/27/2021) 14 each 0   senna-docusate (SENOKOT-S) 8.6-50 MG tablet Take 1 tablet by mouth 2 (two) times daily. (Patient not taking: Reported on 12/21/2021)     tiZANidine (ZANAFLEX) 2 MG tablet Take 1 tablet (2 mg total) by mouth at bedtime as needed for muscle spasms. 30 tablet 2   XARELTO 20 MG TABS tablet Take 20 mg by mouth every evening.     No current facility-administered medications on file prior to visit.    Allergies:  Allergies  Allergen Reactions   Prednisone Other (See Comments)    States that this med makes him "crazy"   Tetanus Toxoids Swelling and Other (See Comments)    Fever, Swelling of the arm    Wellbutrin [Bupropion] Other (See Comments)    Crazy thoughts, nightmares   Varenicline Other (See Comments)    Unpleasant dreams   Past Medical History:  Past Medical History:  Diagnosis Date   Anemia    Anxiety    Arthritis    Basal cell carcinoma (BCC) of forehead    CAD (coronary artery  disease)    a. 10/2015 ant STEMI >> LHC with 3 v CAD; oLAD tx with POBA >> emergent CABG. b. Multiple evals since that time, early graft failure of SVG-RCA by cath 03/2016. c. 2/19 PCI/DES x1 to pRCA, normal EF.   Carotid artery disease ([place])    a. 40-59% BICA 02/2018.   Depression    Dyspnea    Ectopic atrial tachycardia (HCC)    Esophageal reflux    eosinophil esophagitis   Family history of adverse reaction to anesthesia    "sister has PONV" (06/21/2017)   Former tobacco use    Gout    Hepatitis C    "treated and cured" (06/21/2017)   High cholesterol    History of blood transfusion  History of kidney stones    Hypertension    Ischemic cardiomyopathy    a. EF 25-30% at intraop TEE 4/17  //  b. Limited Echo 5/17 - EF 45-50%, mild ant HK. c. EF 55-65% by cath 09/2017.   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 HR dropping into 40s in 02/2016 -> BB reduced.   Stroke [facility]) 10/2016   "small one; sometimes my memory/cognitive issues" (06/21/2017)   Symptomatic hypotension    a. 02/2016 ER visit -> meds reduced.   Syncope    Wears dentures    Wears glasses    Past Surgical History:  Past Surgical History:  Procedure Laterality Date   25 GAUGE PARS PLANA VITRECTOMY WITH 20 GAUGE MVR PORT  12/31/2020   Procedure: 25 GAUGE PARS PLANA VITRECTOMY WITH 20 GAUGE MVR PORT;  Surgeon: [name], MD;  [facility]: [facility];  Service: Thoracic;;   ANTERIOR CERVICAL DECOMP/DISCECTOMY FUSION N/A 10/17/2018   Procedure: Anterior Cervical Decompression Fusion - Cervical seven -Thoracic one;  Surgeon: Consuella Lose, MD;  Location: [place];  Service: Neurosurgery;  Laterality: N/A;   BASAL CELL CARCINOMA EXCISION     "forehead   BIOPSY  07/20/2019   Procedure: BIOPSY;  Surgeon: Carol Ada, MD;  [facility]: [facility];  Service: Endoscopy;;   BIOPSY OF  MEDIASTINAL MASS Right 07/29/2021   Procedure: RIGHT CHEST WALL MASS BIOPSY;  Surgeon: [name], MD;  Location: [place];  Service: Thoracic;  Laterality: Right;   BRONCHIAL BIOPSY  12/24/2020   Procedure: BRONCHIAL BIOPSIES;  Surgeon: Garner Nash, DO;  Location: [place] [facility];  Service: Pulmonary;;   BRONCHIAL BRUSHINGS  12/24/2020   Procedure: BRONCHIAL BRUSHINGS;  Surgeon: Garner Nash, DO;  Location: [place];  Service: Pulmonary;;   BRONCHIAL NEEDLE ASPIRATION BIOPSY  12/24/2020   Procedure: BRONCHIAL NEEDLE ASPIRATION BIOPSIES;  Surgeon: Garner Nash, DO;  Location: [place];  Service: Pulmonary;;   CARDIAC CATHETERIZATION N/A 11/28/2015   Procedure: Left Heart Cath and Coronary Angiography;  Surgeon: [name], MD;  Location: [place] [facility];  Service: Cardiovascular;  Laterality: N/A;   CARDIAC CATHETERIZATION N/A 11/28/2015   Procedure: Coronary Balloon Angioplasty;  Surgeon: [name], MD;  Location: [place] [facility];  Service: Cardiovascular;  Laterality: N/A;  ostial LAD   CARDIAC CATHETERIZATION N/A 11/28/2015   Procedure: Coronary/Graft Angiography;  Surgeon: [name], MD;  Location: [place] [facility];  Service: Cardiovascular;  Laterality: N/A;  coronaries only    CARDIAC CATHETERIZATION N/A 04/21/2016   Procedure: Left Heart Cath and Coronary Angiography;  Surgeon: Wellington Hampshire, MD;  Location: [place] [facility];  Service: Cardiovascular;  Laterality: N/A;   CARDIAC CATHETERIZATION N/A 06/14/2016   Procedure: Left Heart Cath and Cors/Grafts Angiography;  Surgeon: Lorretta Harp, MD;  Location: [place] [facility];  Service: Cardiovascular;  Laterality: N/A;   CARDIAC CATHETERIZATION N/A 09/08/2016   Procedure: Left Heart Cath and Cors/Grafts Angiography;  Surgeon: Wellington Hampshire, MD;  Location: [place] [facility];  Service: Cardiovascular;  Laterality: N/A;   CARDIAC CATHETERIZATION     CORONARY ARTERY BYPASS GRAFT  N/A 11/28/2015   Procedure: CORONARY ARTERY BYPASS GRAFTING (CABG) TIMES FIVE USING LEFT INTERNAL MAMMARY ARTERY AND RIGHT GREATER SAPHENOUS,VIEN HARVEATED BY ENDOVIEN, INTRAOPPRATIVE TEE;  Surgeon: Gaspar Bidding  Alveria Apley, MD;  Location: [place];  Service: Open Heart Surgery;  Laterality: N/A;   CORONARY STENT INTERVENTION N/A 10/05/2017   Procedure: CORONARY STENT INTERVENTION;  Surgeon: [name], MD;  Location: [place] [facility];  Service: Cardiovascular;  Laterality: N/A;   ESOPHAGOGASTRODUODENOSCOPY (EGD) WITH PROPOFOL N/A 07/20/2019   Procedure: ESOPHAGOGASTRODUODENOSCOPY (EGD) WITH PROPOFOL;  Surgeon: Carol Ada, MD;  [facility];  Service: Endoscopy;  Laterality: N/A;   ESOPHAGOGASTRODUODENOSCOPY (EGD) WITH PROPOFOL N/A 01/30/2021   Procedure: ESOPHAGOGASTRODUODENOSCOPY (EGD) WITH PROPOFOL;  Surgeon: Carol Ada, MD;  [facility];  Service: Endoscopy;  Laterality: N/A;   FIDUCIAL MARKER PLACEMENT  12/24/2020   Procedure: FIDUCIAL DYE MARKER PLACEMENT;  Surgeon: Garner Nash, DO;  Location: [place] [facility];  Service: Pulmonary;;   HUMERUS SURGERY Right 1969   "tumor inside bone; filled it w/bone chips"   INTERCOSTAL NERVE BLOCK Right 12/24/2020   Procedure: INTERCOSTAL NERVE BLOCK;  Surgeon: [name], MD;  Location: [place];  Service: Thoracic;  Laterality: Right;   IR IMAGING GUIDED PORT INSERTION  02/06/2021   LEFT HEART CATH AND CORS/GRAFTS ANGIOGRAPHY N/A 03/11/2017   Procedure: Left Heart Cath and Cors/Grafts Angiography;  Surgeon: Leonie Man, MD;  Location: [place] [facility];  Service: Cardiovascular;  Laterality: N/A;   LEFT HEART CATH AND CORS/GRAFTS ANGIOGRAPHY N/A 10/05/2017   Procedure: LEFT HEART CATH AND CORS/GRAFTS ANGIOGRAPHY;  Surgeon: [name], MD;  Location: [place] [facility];  Service: Cardiovascular;  Laterality: N/A;   LEFT HEART CATH AND CORS/GRAFTS ANGIOGRAPHY N/A 04/11/2019   Procedure: LEFT HEART CATH AND CORS/GRAFTS  ANGIOGRAPHY;  Surgeon: [name], MD;  Location: [place] [facility];  Service: Cardiovascular;  Laterality: N/A;   NODE DISSECTION Right 12/24/2020   Procedure: NODE DISSECTION;  Surgeon: [name], MD;  Location: [place];  Service: Thoracic;  Laterality: Right;   PERIPHERAL VASCULAR CATHETERIZATION N/A 06/14/2016   Procedure: Lower Extremity Angiography;  Surgeon: Lorretta Harp, MD;  Location: [place] [facility];  Service: Cardiovascular;  Laterality: N/A;   VIDEO BRONCHOSCOPY WITH ENDOBRONCHIAL NAVIGATION Right 12/24/2020   Procedure: VIDEO BRONCHOSCOPY WITH ENDOBRONCHIAL NAVIGATION;  Surgeon: Garner Nash, DO;  Location: [place];  Service: Pulmonary;  Laterality: Right;   VIDEO BRONCHOSCOPY WITH ENDOBRONCHIAL ULTRASOUND N/A 12/24/2020   Procedure: VIDEO BRONCHOSCOPY WITH ENDOBRONCHIAL ULTRASOUND;  Surgeon: Garner Nash, DO;  Location: [place];  Service: Pulmonary;  Laterality: N/A;   VIDEO BRONCHOSCOPY WITH INSERTION OF INTERBRONCHIAL VALVE (IBV) N/A 12/31/2020   Procedure: VIDEO BRONCHOSCOPY WITH INSERTION OF INTERBRONCHIAL VALVE (IBV).VALVE IN CARTRIDGE 38m,9mm. CHEST TUBE PLACEMENT.;  Surgeon: LLajuana Matte MD;  [facility];  Service: Thoracic;  Laterality: N/A;   VIDEO BRONCHOSCOPY WITH INSERTION OF INTERBRONCHIAL VALVE (IBV) N/A 07/29/2021   Procedure: VIDEO BRONCHOSCOPY WITH REMOVAL OF INTERBRONCHIAL VALVE (IBV);  Surgeon: LLajuana Matte MD;  [facility];  Service: Thoracic;  Laterality: N/A;   Social History:  Social History   Socioeconomic History   Marital status: Married    Spouse name: JAlmyra Free  Number of children: 3   Years of education: College   Highest education level: Not on file  Occupational History   Occupation: SScientist, research (physical sciences) SELF-EMPLOYED  Tobacco Use   Smoking status: Former    Packs/day: 0.75    Years: 44.00    Pack years: 33.00    Types: Cigarettes    Quit date: 11/28/2015    Years since  quitting: 6.1   Smokeless tobacco: Never  Vaping Use   Vaping Use: Never used  Substance and Sexual Activity   Alcohol use: Not Currently   Drug use: No    Comment: 06/21/2017 "nothing since the 1980s"   Sexual activity: Yes  Other Topics Concern   Not on file  Social History Narrative   Patient lives at home with his spouse.   Caffeine Use: yes   Social Determinants of Radio broadcast assistant Strain: Not on file  Food Insecurity: Not on file  Transportation Needs: Not on file  Physical Activity: Not on file  Stress: Not on file  Social Connections: Not on file  Intimate Partner Violence: Not At Risk   Fear of Current or Ex-Partner: No   Emotionally Abused: No   Physically Abused: No   Sexually Abused: No   Family History:  Family History  Problem Relation Age of Onset   Lung cancer Mother    Heart Problems Father    Heart attack Father 47   Stroke Father    Heart failure Father    Heart attack Maternal Grandmother    Stroke Maternal Grandmother    Heart attack Paternal Uncle    Hypertension Brother    Autoimmune disease Neg Hx     Review of Systems: Constitutional: Doesn't report fevers, chills or abnormal weight loss Eyes: Doesn't report blurriness of vision Ears, nose, mouth, throat, and face: Doesn't report sore throat Respiratory: Doesn't report cough, dyspnea or wheezes Cardiovascular: Doesn't report palpitation, chest discomfort  Gastrointestinal:  Doesn't report nausea, constipation, diarrhea GU: Doesn't report incontinence Skin: Doesn't report skin rashes Neurological: Per HPI Musculoskeletal: Doesn't report joint pain Behavioral/Psych: Doesn't report anxiety  Physical Exam: Vitals:   01/28/22 1200  BP: 101/84  Pulse: (!) 120  Resp: 20  Temp: 97.9 F (36.6 C)  SpO2: 98%    KPS: 90. General: Alert, cooperative, pleasant, in no acute distress Head: Normal EENT: No conjunctival injection or scleral icterus.  Lungs: Resp effort  normal Cardiac: Regular rate Abdomen: Non-distended abdomen Skin: No rashes cyanosis or petechiae. Extremities: No clubbing or edema  Neurologic Exam: Mental Status: Awake, alert, attentive to examiner. Oriented to self and environment. Language is fluent with intact comprehension.  Cranial Nerves: Visual acuity is grossly normal. Visual fields are full. Extra-ocular movements intact. No ptosis. Face is symmetric Motor: Tone and bulk are normal. Power is full in both arms and legs. Polyminimyoclonus noted. Reflexes are symmetric, no pathologic reflexes present.  Sensory: Intact to light touch Gait: Normal.   Labs: I have reviewed the data as listed    Component Value Date/Time   NA 133 (L) 01/14/2022 1200   K 4.2 01/14/2022 1200   CL 102 01/14/2022 1200   CO2 23 01/14/2022 1200   GLUCOSE 110 (H) 01/14/2022 1200   BUN 13 01/14/2022 1200   CREATININE 0.87 01/14/2022 1200   CREATININE 1.08 01/11/2022 1035   CREATININE 1.00 02/12/2016 1038   CALCIUM 9.2 01/14/2022 1200   PROT 7.4 01/14/2022 1200   PROT 6.7 11/08/2017 0833   ALBUMIN 3.2 (L) 01/14/2022 1200   ALBUMIN 4.4 11/08/2017 0833   AST 28 01/14/2022 1200   AST 21 01/11/2022 1035   ALT 15 01/14/2022 1200   ALT 13 01/11/2022 1035   ALKPHOS 97 01/14/2022 1200   BILITOT 1.0 01/14/2022 1200   BILITOT 0.6 01/11/2022 1035   GFRNONAA >60 01/14/2022 1200   GFRNONAA >60 01/11/2022 1035   GFRAA >60 05/27/2020 1039  Lab Results  Component Value Date   WBC 11.6 (H) 01/14/2022   NEUTROABS 11.0 (H) 01/14/2022   HGB 11.2 (L) 01/14/2022   HCT 33.3 (L) 01/14/2022   MCV 97.7 01/14/2022   PLT 250 01/14/2022   Imaging:  [place] Clinician Interpretation: I have personally reviewed the CNS images as listed.  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 Brain metastases. Prior SRS most recently 10/27/2021. Rest[place]. EXAM: MRI HEAD WITHOUT AND WITH CONTRAST TECHNIQUE: Multiplanar, multiecho pulse sequences of the brain and surrounding structures were obtained without and with intravenous contrast. CONTRAST:  62mL MULTIHANCE GADOBENATE DIMEGLUMINE 529 MG/ML IV SOLN COMPARISON:  Prior brain MRI 10/16/2021 and earlier. FINDINGS: BRAIN New Lesions: Approximately 15 new small enhancing metastases (at least 6 in the cerebellum alone). Many are punctate. There are 2 adjacent lesions in the inferior medial right occipital lobe on series 15, image 58, each 4-5 mm. And questionable additional small new metastasis along the inferior right temporal lobe on series 15, image 53. All lesions annotated with double arrows on series 15. Larger lesions: The right perirolandic lesion on series 15, image 147 has increased from about 4 mm in February 2 7 mm now. Adjacent T2 and FLAIR hyperintense vasogenic edema has also increased but is mild (series 12, image 46). Left lateral cerebellar enhancing metastasis on series 15, image 45 appears slightly larger and more solidly enhancing. Surrounding edema also appears slightly increased on series 12, image 13. Stable or Smaller lesions: Anterior left thalamic lesion has regressed on series 15, image 102, now punctate. Mild regional edema has resolved. Punctate left parietal lobe lesion seen previously on series 11, image 129 is not evident today. And the left middle frontal gyrus lesion previously seen on series 11, image 146 is not evident. Other Brain findings: No significant intracranial mass effect. No midline shift. No superimposed restricted diffusion to suggest acute infarction. No ventriculomegaly, extra-axial collection or acute intracranial hemorrhage. Cervicomedullary junction and pituitary are within normal limits. Mild hemosiderin associated with treated metastases is stable. No dural  thickening is identified. Vascular: Major intracranial vascular flow voids are stable. On series 16 the major dural venous sinuses are enhancing and appear to be patent. Skull and upper cervical spine: Negative visible cervical spinal cord. Visualized bone marrow signal is within normal limits. Sinuses/Orbits: Stable, negative. Other: Visible internal auditory structures appear normal. Visible scalp and face appear negative. IMPRESSION: 1. Progression of disease. 15-16 small new brain metastases since February, many punctate. The largest are 5 mm. All lesions annotated on series 15. 2. Two slightly larger treated metastases: Right perirolandic and left lateral cerebellum. The former has conspicuously increased surrounding edema but there is no significant mass effect. 3. 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 Electronically Signed   By: Genevie Ann M.D.   On: 01/27/2022 09:22   CT ABDOMEN PELVIS W CONTRAST  Result Date: 01/14/2022 CLINICAL DATA:  Abdominal pain, acute, nonlocalized. History of non-small cell lung cancer EXAM: CT ABDOMEN AND PELVIS WITH CONTRAST TECHNIQUE: Multidetector CT imaging of the abdomen and pelvis was performed using the standard protocol following bolus administration of intravenous contrast. RADIATION DOSE REDUCTION: This exam was performed according to the departmental dose-optimization program which includes automated exposure control, adjustment of the mA and/or kV according to patient size and/or use of iterative reconstruction technique.  CONTRAST:  1100m OMNIPAQUE IOHEXOL 300 MG/ML  SOLN COMPARISON:  CT 01/10/2022, 12/17/2021, 10/02/2021 FINDINGS: Lower chest: Chronic small right pleural effusion with associated right pleural thickening. Patchy right lower lobe airspace opacities. Multiple small pulmonary nodules within the left lung base stable in appearance from prior and compatible with known metastatic disease. Hepatobiliary: No solid liver abnormality is seen. No gallstones,  gallbladder wall thickening, or biliary dilatation. Pancreas: Unremarkable. No pancreatic ductal dilatation or surrounding inflammatory changes. Spleen: Normal in size without focal abnormality. Adrenals/Urinary Tract: Adrenal glands are unremarkable. Kidneys are normal, without renal calculi, solid lesion, or hydronephrosis. Bladder is unremarkable. Stomach/Bowel: Long segment circumferential wall thickening of the ascending and proximal transverse colon with mild pericolonic fat stranding. Normal appendix in the right lower quadrant (series 2, image 59). No dilated loops of bowel. Stomach within normal limits. Vascular/Lymphatic: Aortic atherosclerosis. No enlarged abdominal or pelvic lymph nodes. Reproductive: No mass or other significant abnormality. Other: No free fluid. No abdominopelvic fluid collection. No pneumoperitoneum. No abdominal wall hernia. Musculoskeletal: Chronic deformities of the posterior right eleventh and twelfth ribs, unchanged. No new focal bony abnormality. Focal areas of nodularity within the soft tissues of the posterior lower right chest wall posterior to the right eleventh and twelfth ribs at least partially within the erector spinae musculature (series 2, images 29-32). These have decreased in size compared to 10/02/2021. IMPRESSION: 1. Long segment circumferential wall thickening of the ascending and proximal transverse colon with mild pericolonic fat stranding, suggestive of an infectious or inflammatory colitis. 2. Chronic small right pleural effusion with associated right pleural thickening. Patchy right lower lobe airspace opacities which could represent atelectasis and/or pneumonia. 3. Multiple small pulmonary nodules within the left lung base stable in appearance from prior and compatible with known metastatic disease. 4. Focal areas of soft tissue nodularity of the posterior lower right chest wall, stable in appearance from prior and decreased in size compared to 10/02/2021.  Aortic Atherosclerosis (ICD10-I70.0). Electronically Signed   By: NDavina PokeD.O.   On: 01/14/2022 14:52   CT ABDOMEN PELVIS W CONTRAST  Result Date: 01/10/2022 CLINICAL DATA:  Abdominal pain, nausea and vomiting for 2 days, ongoing chemotherapy, metastatic lung cancer * Tracking Code: BO * EXAM: CT ABDOMEN AND PELVIS WITH CONTRAST TECHNIQUE: Multidetector CT imaging of the abdomen and pelvis was performed using the standard protocol following bolus administration of intravenous contrast. RADIATION DOSE REDUCTION: This exam was performed according to the departmental dose-optimization program which includes automated exposure control, adjustment of the mA and/or kV according to patient size and/or use of iterative reconstruction technique. CONTRAST:  1018mOMNIPAQUE IOHEXOL 300 MG/ML  SOLN COMPARISON:  12/17/2021 FINDINGS: Lower chest: Small, chronic, loculated right pleural effusion with associated pleural thickening. Coronary artery calcifications. Hepatobiliary: No solid liver abnormality is seen. No gallstones, gallbladder wall thickening, or biliary dilatation. Pancreas: Unremarkable. No pancreatic ductal dilatation or surrounding inflammatory changes. Spleen: Normal in size without significant abnormality. Adrenals/Urinary Tract: Adrenal glands are unremarkable. Kidneys are normal, without renal calculi, solid lesion, or hydronephrosis. Bladder is unremarkable. Stomach/Bowel: Stomach is within normal limits. Appendix appears normal. No evidence of bowel wall thickening, distention, or inflammatory changes. Sigmoid diverticula. Vascular/Lymphatic: Aortic atherosclerosis. No enlarged abdominal or pelvic lymph nodes. Reproductive: No mass or other significant abnormality. Other: No abdominal wall hernia or abnormality. No ascites. Musculoskeletal: No acute osseous findings. Unchanged sclerotic fractures of the posterior right eleventh and twelfth ribs (series 2, image 13). IMPRESSION: 1. No acute CT  findings of the abdomen or  pelvis to explain abdominal pain, nausea or vomiting. 2. Sigmoid diverticulosis without evidence of acute diverticulitis. 3. Small, chronic, loculated right pleural effusion with associated pleural thickening. 4. Coronary artery disease. Aortic Atherosclerosis (ICD10-I70.0). Electronically Signed   By: Delanna Ahmadi M.D.   On: 01/10/2022 11:33   DG Chest Portable 1 View  Result Date: 01/14/2022 CLINICAL DATA:  Stage IV lung cancer, hematemesis EXAM: PORTABLE CHEST 1 VIEW COMPARISON:  Prior chest x-ray 10/02/2021; CT scan of the chest 12/17/2021 FINDINGS: Right IJ approach single-lumen power injectable port catheter remains in good position with the tip overlying the cavoatrial junction. Patient is status post median sternotomy with evidence of multivessel CABG. Chronic elevation of the right hemidiaphragm with probable chronic small right pleural effusion. Multiple small pulmonary nodules better seen on recent CT imaging. No pneumothorax. No new focal airspace infiltrate. IMPRESSION: 1. Stable chest x-ray without evidence of acute cardiopulmonary process. 2. Stable chronic right pleural effusion and associated elevation of the right hemidiaphragm. 3. Multiple small pulmonary nodules better demonstrated on recent CT imaging. Electronically Signed   By: Jacqulynn Cadet M.D.   On: 01/14/2022 12:39     Assessment/Plan 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[place])  [name] is clinically stable today.  MRI brain unfortunately demonstrates progression of disease, with ~16 novel metastases.  All are sub-cm, none symptomatic at this time.  We reviewed proceeding with whole brain radiation, likely rather than radiosurgery.  This will be reviewed and discussed in detail in brian/spine tumor board on 02/01/22.  He is agreeable with WBRT if needed.  We reviewed cognitive side effects, as well as elevated risk for radionecrosis given recent treatment.  Will defer steroids given  small tumor volumes.  We appreciate the opportunity to participate in the care of [name].    All questions were answered. The patient knows to call the clinic with any problems, questions or concerns. No barriers to learning were detected.  The total time spent in the encounter was 40 minutes and more than 50% was on counseling and review of test results   Ventura Sellers, MD Medical Director of Neuro-Oncology [facility] [place] 01/28/22 12:27 PM

## 2022-01-29 ENCOUNTER — Telehealth: Payer: Self-pay | Admitting: Medical Oncology

## 2022-01-29 ENCOUNTER — Other Ambulatory Visit: Payer: Self-pay | Admitting: Physician Assistant

## 2022-01-29 DIAGNOSIS — R059 Cough, unspecified: Secondary | ICD-10-CM

## 2022-01-29 MED ORDER — BENZONATATE 100 MG PO CAPS: 100.0000 mg | ORAL_CAPSULE | Freq: Three times a day (TID) | ORAL | 0 refills | Status: DC | PRN

## 2022-01-29 NOTE — Telephone Encounter (Signed)
COUGH-In the past  2 weeks "my cough is getting worse . Its killing my sleep.I cough up white stuff . The mucinex does not help anymore. I am also taking throat lozenges."  Per Cassie,  I instructed [name] to start Delsym and if that does not help then  pick up Tesslon Pearles that Cassie sent in.

## 2022-02-01 ENCOUNTER — Ambulatory Visit: Payer: Medicare Other | Admitting: Radiation Oncology

## 2022-02-01 ENCOUNTER — Ambulatory Visit: Payer: Medicare Other | Admitting: Internal Medicine

## 2022-02-01 ENCOUNTER — Other Ambulatory Visit (HOSPITAL_COMMUNITY): Payer: Self-pay

## 2022-02-01 ENCOUNTER — Ambulatory Visit: Payer: Medicare Other

## 2022-02-01 ENCOUNTER — Other Ambulatory Visit: Payer: Medicare Other

## 2022-02-01 ENCOUNTER — Encounter: Payer: Self-pay | Admitting: Radiation Oncology

## 2022-02-01 ENCOUNTER — Inpatient Hospital Stay: Payer: Medicare Other

## 2022-02-01 IMAGING — DX DG CHEST 1V PORT
1 series · 1 of 1 positions shown · non-contrast
Comparison: 11/07/2019

CLINICAL DATA: Left-sided neck pain and chest pain

EXAM:
PORTABLE CHEST 1 VIEW

[chest]
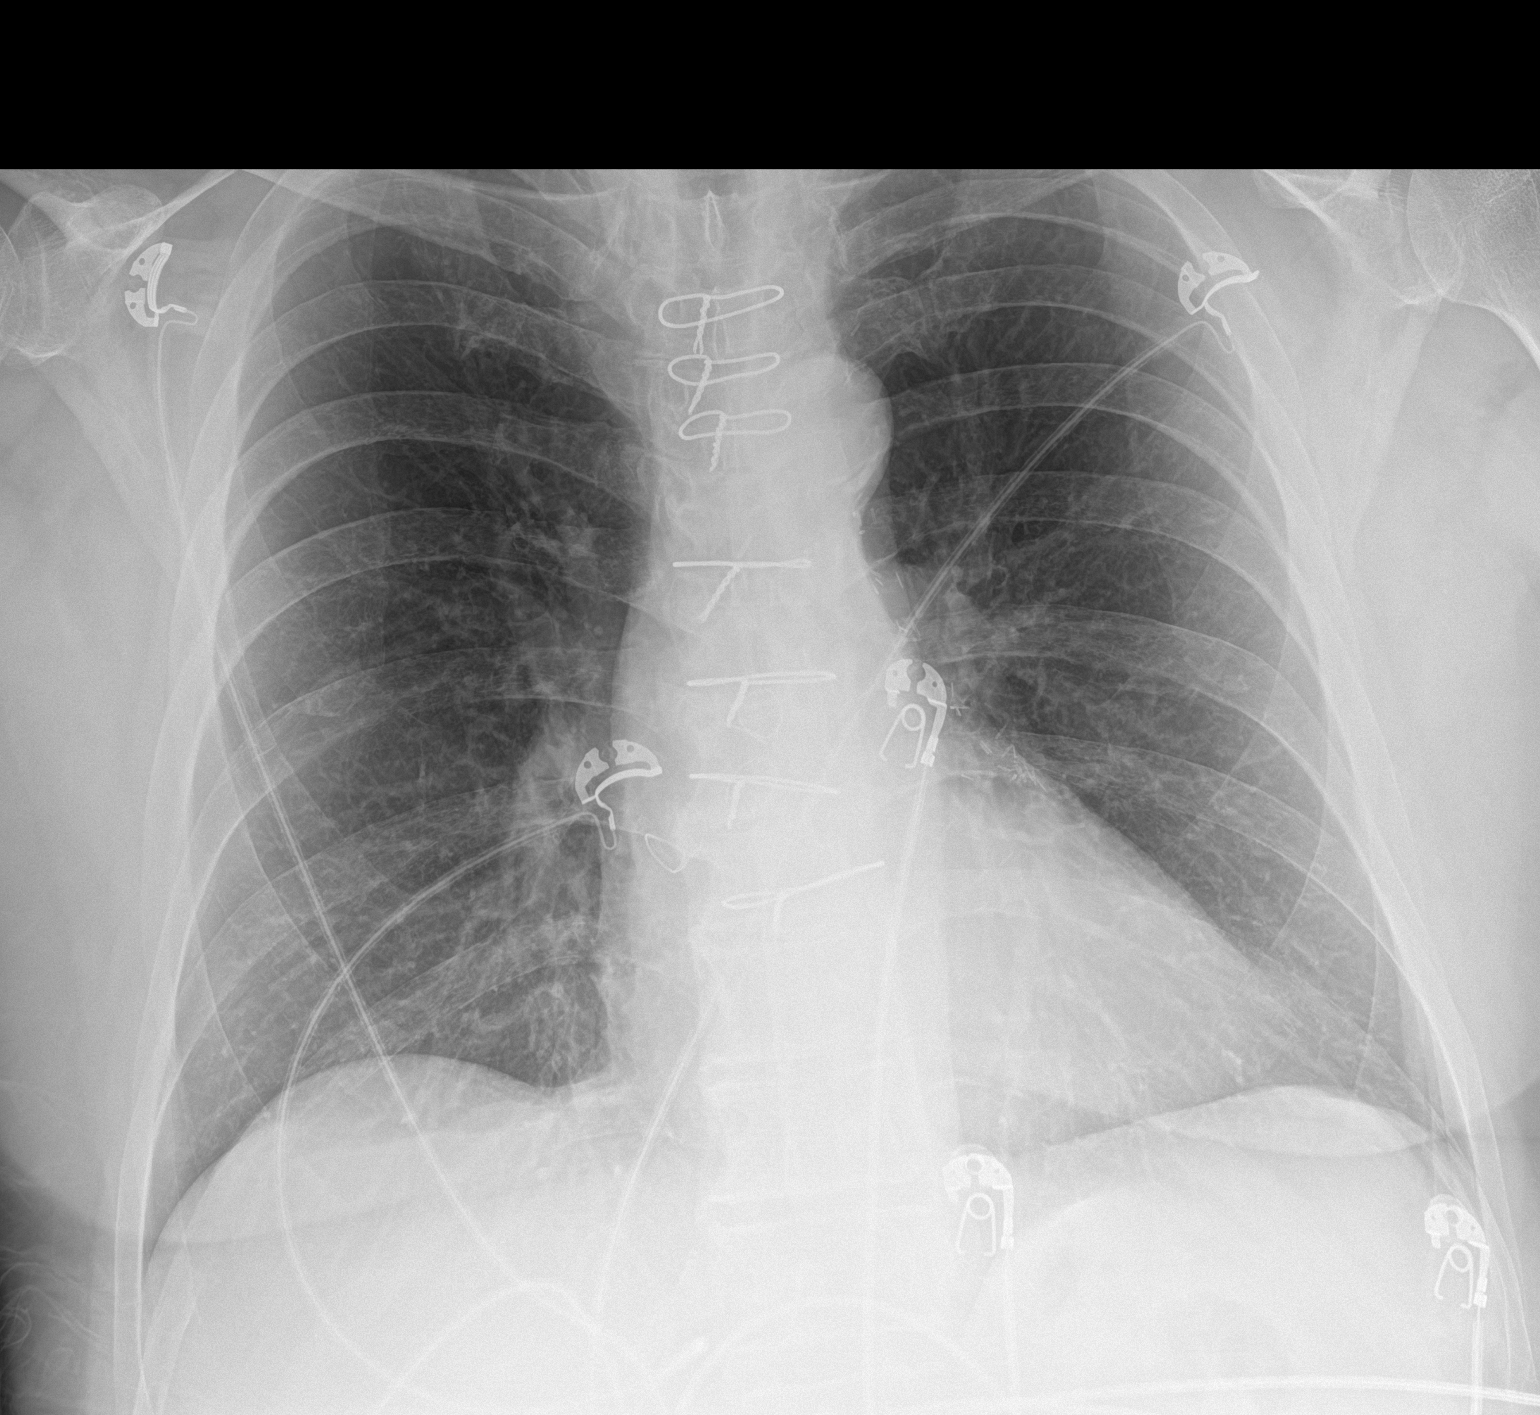

[1 of 1 positions shown; findings below may reference images not displayed]

FINDINGS: The heart size and mediastinal contours are within normal limits.
Both lungs are clear. The visualized skeletal structures are
unremarkable.

Remote median sternotomy.
IMPRESSION: No active disease.

## 2022-02-02 ENCOUNTER — Encounter: Payer: Self-pay | Admitting: Internal Medicine

## 2022-02-02 ENCOUNTER — Inpatient Hospital Stay (HOSPITAL_BASED_OUTPATIENT_CLINIC_OR_DEPARTMENT_OTHER): Payer: Medicare Other | Admitting: Internal Medicine

## 2022-02-02 ENCOUNTER — Other Ambulatory Visit (HOSPITAL_COMMUNITY): Payer: Self-pay

## 2022-02-02 ENCOUNTER — Telehealth: Payer: Self-pay | Admitting: Radiation Oncology

## 2022-02-02 ENCOUNTER — Inpatient Hospital Stay: Payer: Medicare Other

## 2022-02-02 ENCOUNTER — Inpatient Hospital Stay: Payer: Medicare Other | Admitting: Dietician

## 2022-02-02 ENCOUNTER — Other Ambulatory Visit: Payer: Self-pay

## 2022-02-02 ENCOUNTER — Encounter: Payer: Self-pay | Admitting: Nurse Practitioner

## 2022-02-02 ENCOUNTER — Inpatient Hospital Stay (HOSPITAL_BASED_OUTPATIENT_CLINIC_OR_DEPARTMENT_OTHER): Payer: Medicare Other | Admitting: Nurse Practitioner

## 2022-02-02 VITALS — BP 119/81 | HR 109 | Temp 97.2°F | Resp 18 | Ht 69.0 in | Wt 154.9 lb

## 2022-02-02 DIAGNOSIS — C3491 Malignant neoplasm of unspecified part of right bronchus or lung: Secondary | ICD-10-CM

## 2022-02-02 DIAGNOSIS — Z515 Encounter for palliative care: Secondary | ICD-10-CM

## 2022-02-02 DIAGNOSIS — R5382 Chronic fatigue, unspecified: Secondary | ICD-10-CM

## 2022-02-02 DIAGNOSIS — Z95828 Presence of other vascular implants and grafts: Secondary | ICD-10-CM

## 2022-02-02 DIAGNOSIS — R634 Abnormal weight loss: Secondary | ICD-10-CM | POA: Diagnosis not present

## 2022-02-02 DIAGNOSIS — Z5111 Encounter for antineoplastic chemotherapy: Secondary | ICD-10-CM | POA: Diagnosis not present

## 2022-02-02 DIAGNOSIS — Z7189 Other specified counseling: Secondary | ICD-10-CM

## 2022-02-02 DIAGNOSIS — R110 Nausea: Secondary | ICD-10-CM

## 2022-02-02 DIAGNOSIS — R630 Anorexia: Secondary | ICD-10-CM

## 2022-02-02 DIAGNOSIS — C7A1 Malignant poorly differentiated neuroendocrine tumors: Secondary | ICD-10-CM

## 2022-02-02 DIAGNOSIS — G893 Neoplasm related pain (acute) (chronic): Secondary | ICD-10-CM | POA: Diagnosis not present

## 2022-02-02 DIAGNOSIS — Z5112 Encounter for antineoplastic immunotherapy: Secondary | ICD-10-CM

## 2022-02-02 LAB — CBC WITH DIFFERENTIAL (CANCER CENTER ONLY)
Abs Immature Granulocytes: 0.06 10*3/uL (ref 0.00–0.07)
Basophils Absolute: 0.1 10*3/uL (ref 0.0–0.1)
Basophils Relative: 1 %
Eosinophils Absolute: 0.1 10*3/uL (ref 0.0–0.5)
Eosinophils Relative: 1 %
HCT: 33.2 % — ABNORMAL LOW (ref 39.0–52.0)
Hemoglobin: 11 g/dL — ABNORMAL LOW (ref 13.0–17.0)
Immature Granulocytes: 1 %
Lymphocytes Relative: 10 %
Lymphs Abs: 0.9 10*3/uL (ref 0.7–4.0)
MCH: 32.9 pg (ref 26.0–34.0)
MCHC: 33.1 g/dL (ref 30.0–36.0)
MCV: 99.4 fL (ref 80.0–100.0)
Monocytes Absolute: 1.1 10*3/uL — ABNORMAL HIGH (ref 0.1–1.0)
Monocytes Relative: 11 %
Neutro Abs: 7.4 10*3/uL (ref 1.7–7.7)
Neutrophils Relative %: 76 %
Platelet Count: 297 10*3/uL (ref 150–400)
RBC: 3.34 MIL/uL — ABNORMAL LOW (ref 4.22–5.81)
RDW: 15.3 % (ref 11.5–15.5)
WBC Count: 9.6 10*3/uL (ref 4.0–10.5)
nRBC: 0 % (ref 0.0–0.2)

## 2022-02-02 LAB — CMP (CANCER CENTER ONLY)
ALT: 15 U/L (ref 0–44)
AST: 20 U/L (ref 15–41)
Albumin: 3.7 g/dL (ref 3.5–5.0)
Alkaline Phosphatase: 111 U/L (ref 38–126)
Anion gap: 7 (ref 5–15)
BUN: 17 mg/dL (ref 8–23)
CO2: 25 mmol/L (ref 22–32)
Calcium: 10.2 mg/dL (ref 8.9–10.3)
Chloride: 103 mmol/L (ref 98–111)
Creatinine: 0.88 mg/dL (ref 0.61–1.24)
GFR, Estimated: >60 mL/min (ref >60)
Glucose, Bld: 111 mg/dL — ABNORMAL HIGH (ref 70–99)
Potassium: 4 mmol/L (ref 3.5–5.1)
Sodium: 135 mmol/L (ref 135–145)
Total Bilirubin: 0.5 mg/dL (ref 0.3–1.2)
Total Protein: 7.6 g/dL (ref 6.5–8.1)

## 2022-02-02 LAB — TSH: TSH: 0.662 u[IU]/mL (ref 0.350–4.500)

## 2022-02-02 MED ORDER — HYDROMORPHONE HCL 4 MG PO TABS
8.0000 mg | ORAL_TABLET | Freq: Four times a day (QID) | ORAL | 0 refills | Status: DC | PRN
Start: 2022-02-02 — End: 2022-02-02

## 2022-02-02 MED ORDER — HYDROMORPHONE HCL 8 MG PO TABS
8.0000 mg | ORAL_TABLET | Freq: Four times a day (QID) | ORAL | 0 refills | Status: DC | PRN
Start: 2022-02-02 — End: 2022-02-22
  Filled 2022-02-02: qty 60, 15d supply, fill #0

## 2022-02-02 MED ORDER — PROCHLORPERAZINE MALEATE 10 MG PO TABS: 10.0000 mg | ORAL_TABLET | Freq: Four times a day (QID) | ORAL | 2 refills | Status: DC | PRN

## 2022-02-02 MED ORDER — SODIUM CHLORIDE 0.9 % IV SOLN: Freq: Once | INTRAVENOUS | Status: AC

## 2022-02-02 MED ORDER — PROCHLORPERAZINE MALEATE 10 MG PO TABS
10.0000 mg | ORAL_TABLET | Freq: Four times a day (QID) | ORAL | 2 refills | Status: DC | PRN
  Filled 2022-02-02: qty 30, 8d supply, fill #0

## 2022-02-02 MED ORDER — TIZANIDINE HCL 2 MG PO TABS: 2.0000 mg | ORAL_TABLET | Freq: Every evening | ORAL | 2 refills | Status: DC | PRN

## 2022-02-02 MED ORDER — SODIUM CHLORIDE 0.9% FLUSH
10.0000 mL | INTRAVENOUS | Status: DC | PRN
  Administered 2022-02-02: 10 mL

## 2022-02-02 MED ORDER — TIZANIDINE HCL 2 MG PO TABS
2.0000 mg | ORAL_TABLET | Freq: Every evening | ORAL | 2 refills | Status: DC | PRN
  Filled 2022-02-02: qty 30, 30d supply, fill #0

## 2022-02-02 MED ORDER — SODIUM CHLORIDE 0.9% FLUSH
10.0000 mL | Freq: Once | INTRAVENOUS | Status: AC
  Administered 2022-02-02: 10 mL

## 2022-02-02 MED ORDER — PROCHLORPERAZINE MALEATE 10 MG PO TABS
10.0000 mg | ORAL_TABLET | Freq: Once | ORAL | Status: AC
  Administered 2022-02-02: 10 mg via ORAL
  Filled 2022-02-02: qty 1

## 2022-02-02 MED ORDER — DULOXETINE HCL 30 MG PO CPEP: 30.0000 mg | ORAL_CAPSULE | Freq: Two times a day (BID) | ORAL | 1 refills | Status: DC

## 2022-02-02 MED ORDER — HEPARIN SOD (PORK) LOCK FLUSH 100 UNIT/ML IV SOLN
500.0000 [IU] | Freq: Once | INTRAVENOUS | Status: AC | PRN
  Administered 2022-02-02: 500 [IU]

## 2022-02-02 MED ORDER — SODIUM CHLORIDE 0.9 % IV SOLN
200.0000 mg | Freq: Once | INTRAVENOUS | Status: AC
  Administered 2022-02-02: 200 mg via INTRAVENOUS
  Filled 2022-02-02: qty 200

## 2022-02-02 MED ORDER — SODIUM CHLORIDE 0.9 % IV SOLN
500.0000 mg/m2 | Freq: Once | INTRAVENOUS | Status: AC
  Administered 2022-02-02: 1000 mg via INTRAVENOUS
  Filled 2022-02-02: qty 40

## 2022-02-02 MED ORDER — DULOXETINE HCL 30 MG PO CPEP
30.0000 mg | ORAL_CAPSULE | Freq: Two times a day (BID) | ORAL | 1 refills | Status: DC
  Filled 2022-02-02: qty 60, 30d supply, fill #0

## 2022-02-02 MED ORDER — HYDROMORPHONE HCL 8 MG PO TABS
8.0000 mg | ORAL_TABLET | Freq: Four times a day (QID) | ORAL | 0 refills | Status: DC | PRN
Start: 2022-02-02 — End: 2022-02-02

## 2022-02-02 NOTE — Progress Notes (Signed)
[place]  [phone] [phone]) [phone]   Name: ALEXANDER AUMENT Date: 02/02/2022 MRN: [id number]  DOB: [birth year]  Patient Care Team: [name], MD as PCP - General (Family Medicine) [name], MD as PCP - Cardiology (Cardiology) Icard, Octavio Graves, DO as Consulting Physician (Pulmonary Disease) Valrie Hart, RN as Oncology Nurse Navigator (Oncology) Maryanna Shape, NP as Nurse Practitioner (Oncology) Curt Bears, MD as Consulting Physician (Oncology) Pickenpack-Cousar, Carlena Sax, NP as Nurse Practitioner (Nurse Practitioner)    INTERVAL HISTORY: JUD FANGUY is a 66 y.o. 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 He was admitted on 08/29/2021 and 09/15/21 from home with uncontrolled intractable chest wall pain. He has completed all of his radiation treatments. Now with plans to undergo immunotherapy as recommended by [facility].   SOCIAL HISTORY:     reports that he quit smoking about 6 years ago. His smoking use included cigarettes. He has a 33.00 pack-year smoking history. He has never used smokeless tobacco. He reports that he does not currently use alcohol. He reports that he does not use drugs.  ADVANCE DIRECTIVES:  None on file   CODE STATUS:   PAST MEDICAL HISTORY: Past Medical History:  Diagnosis Date   Anemia    Anxiety    Arthritis    Basal cell carcinoma (BCC) of forehead    CAD (coronary artery disease)    a. 10/2015 ant STEMI >> LHC with 3 v CAD; oLAD tx with POBA >> emergent CABG. b. Multiple evals since that time,  early graft failure of SVG-RCA by cath 03/2016. c. 2/19 PCI/DES x1 to pRCA, normal EF.   Carotid artery disease ([place])    a. 40-59% BICA 02/2018.   Depression    Dyspnea    Ectopic atrial tachycardia (HCC)    Esophageal reflux    eosinophil esophagitis   Family history of adverse reaction to anesthesia    "sister has PONV" (06/21/2017)   Former tobacco use    Gout    Hepatitis C    "treated and cured" (06/21/2017)   High cholesterol    History of blood transfusion    History of kidney stones    Hypertension    Ischemic cardiomyopathy    a. EF 25-30% at intraop TEE 4/17  //  b. Limited Echo 5/17 - EF 45-50%, mild ant HK. c. EF 55-65% by cath 09/2017.   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[place])    Migraine    "3-4/yr" (06/21/2017)   Myocardial infarction ([place]) 10/2015   Palpitations    Pneumonia    Sinus bradycardia    a. HR dropping into 40s in 02/2016 -> BB reduced.   Stroke [facility]) 10/2016   "small one; sometimes my memory/cognitive issues" (06/21/2017)   Symptomatic hypotension    a. 02/2016 ER visit -> meds reduced.   Syncope    Wears dentures    Wears glasses     ALLERGIES:  is allergic to prednisone, tetanus toxoids, wellbutrin [bupropion], and varenicline.  MEDICATIONS:  Current Outpatient Medications  Medication Sig Dispense Refill   acetaminophen (TYLENOL) 500 MG tablet Take 1,000 mg by mouth every 6 (six)  hours as needed for mild pain, fever or headache.     aspirin EC 81 MG tablet Take 81 mg by mouth daily. Swallow whole.     benzonatate (TESSALON) 100 MG capsule Take 1 capsule (100 mg total) by mouth 3 (three) times daily as needed for cough. 30 capsule 0   diclofenac (VOLTAREN) 25 MG EC tablet Take by mouth.     docusate sodium (COLACE) 100 MG capsule Take 100-200 mg by mouth daily. (Patient not taking: Reported on 02/02/2022)     DULoxetine (CYMBALTA) 30 MG capsule Take 1 capsule (30 mg total) by mouth 2 (two) times daily. 60 capsule 1   folic acid (FOLVITE) 1 MG  tablet Take 1 tablet (1 mg total) by mouth daily. 30 tablet 4   HYDROmorphone (DILAUDID) 4 MG tablet Take 2 tablets by mouth every 6 hours as needed for severe pain. (01/22/22) 90 tablet 0   lidocaine (LIDODERM) 5 % Place 1 patch onto the skin daily as needed (for pain- Remove & Discard patch within 12 hours or as directed by MD). (Patient not taking: Reported on 02/02/2022)     lidocaine-prilocaine (EMLA) cream Apply to the Port-A-Cath site 30-60-minute before treatment 30 g 0   magnesium oxide (MAG-OX) 400 (240 Mg) MG tablet Take 1 tablet by mouth daily.     metoprolol succinate (TOPROL-XL) 25 MG 24 hr tablet Take 1 tablet (25 mg total) by mouth daily. (Patient taking differently: Take 25 mg by mouth every evening.) 90 tablet 3   morphine (MS CONTIN) 60 MG 12 hr tablet Take 1 tablet by mouth every 12 hours. (01/29/22) 60 tablet 0   ondansetron (ZOFRAN) 8 MG tablet Take 1 tablet by mouth every 8 hours as needed for nausea or vomiting. Starting 3 days after chemotherapy 90 tablet 2   polyethylene glycol (MIRALAX / GLYCOLAX) 17 g packet Take 17 g by mouth daily. (Patient not taking: Reported on 11/27/2021) 14 each 0   prochlorperazine (COMPAZINE) 10 MG tablet Take 10 mg by mouth every 6 (six) hours as needed.     senna-docusate (SENOKOT-S) 8.6-50 MG tablet Take 1 tablet by mouth 2 (two) times daily. (Patient not taking: Reported on 12/21/2021)     tiZANidine (ZANAFLEX) 2 MG tablet Take 1 tablet (2 mg total) by mouth at bedtime as needed for muscle spasms. 30 tablet 2   XARELTO 20 MG TABS tablet Take 20 mg by mouth every evening.     No current facility-administered medications for this visit.    VITAL SIGNS: There were no vitals taken for this visit. There were no vitals filed for this visit.   Estimated body mass index is 22.87 kg/m as calculated from the following:   Height as of an earlier encounter on 02/02/22: 5\' 9"  (1.753 m).   Weight as of an earlier encounter on 02/02/22: 154 lb 14.4 oz (70.3  kg).   PERFORMANCE STATUS (ECOG) : 1 - Symptomatic but completely ambulatory   Physical Exam General: NAD, thin  Pulmonary: normal breathing pattern, diminished   Cardio: Tachycardic  Neurological: AAOx 4, appears comfortable  IMPRESSION:  Mr. Shorten presents to the clinic today for symptom management follow-up. His wife is with him today. Seen by Dr. [name] today and Dr. [name] on 01/28/22. Unfortunately recent brain MRI showed disease progression with 16 metastases sites. [place] is asymptomatic at this time. He shares goal is to continue to treat the treatable allowing him every opportunity to thrive.   Is complaining of cough. Pain  is well controlled. Denies constipation or diarrhea.   Neoplasm related pain Pain remains controlled. He has tolerated weaning process of medications thus far. He is anxious about making further adjustments given new findings of brain metastasis and possible plans for whole brain radiation. We will continue to closely monitor and adjust according. He and wife verbalized understanding and appreciation.   MS Contin 60 mg every 12 hours x 1 months (120 mg/day) June  MS Contin 45 mg every 12 hours x 1 month (90mg /day) July  MS Contin 30 mg every 12 hours x 1 month (60 mg/day) August  MS Contin 15 mg every 12 hours x1 month (30 mg/day) Sept   Hydromorphone 8 mg every 6 hours x 2 weeks (32 mg/day) 5/26, 6/6 Hydromorphone 4 mg every 4 hours x2 weeks (24 mg/day) Hydromorphone 4 mg every 6 hours x 3 weeks (16 mg) then we will further discuss   Constipation Reports bowel habits have been regular over the past 4 days. There was some previous challenges over the past month due to colitis.   Decreased appetite/weight loss Appetite somewhat improving. He was able to eat foods that he enjoyed over the past week with minimal concerns of taste alterations or nausea. States his appetite generally decreases with treatment cycles. Wife shares plans to go out to eat after  his treatment today.   Weight continues to decrease. Weight today is 154.14lbs, down from 156 lb 6/1, 160 lbs 5/18, 168.6 lbs 4/24, down from 170 lbs on 4/20, has increased to 178 lbs 3/22, 169 lbs 2/21. Discussed importance of small frequent meals and increased protein intake. Goal is to gain better control and see some improvement over time.   4. Nausea/Vomiting   This has improved over the past week. Able to tolerate diet. He has anti-emetics in the home.    PLAN: MS Contin as prescribed (see above) Dilaudid as needed with plans to continue weaning down over time (see above).  Gabapentin as prescribed-Discontinued  Cymbalta as prescribed Zofran and compazine as needed for ongoing nausea/vomiting.  Miralax and Colace daily for bowel regimen. We will continue to closely monitor medications and evaluate best pain regimen. With a goal of weaning while also managing pain effectively.  I will plan to see him back in 3-4 weeks in collaboration with his other oncology appointments.  He knows to call with any needs prior to next visit.   Patient expressed understanding and was in agreement with this plan. He also understands that He can call the clinic at any time with any questions, concerns, or complaints.   Number and complexity of problems addressed: 3 HIGH - 1 or more chronic illnesses with SEVERE exacerbation, progression, or side effects of treatment - advanced cancer, pain. Any controlled substances utilized were prescribed in the context of palliative care.  Time Total: 35 min   Visit consisted of counseling and education dealing with the complex and emotionally intense issues of symptom management and palliative care in the setting of serious and potentially life-threatening illness.Greater than 50%  of this time was spent counseling and coordinating care related to the above assessment and plan.  Alda Lea, AGPCNP-BC  Palliative Medicine Team/[place]

## 2022-02-02 NOTE — Progress Notes (Signed)
Per Dr. [name] , it is ok to treat pt today with Keytruda and Alimta and heart rate of 109.

## 2022-02-02 NOTE — Telephone Encounter (Signed)
I called the patient at his request to review whole brain radiation recommendations from Dr. Mauri Reading and Dr. Ida Rogue discussion from brain oncology conference. We discussed the risks, benefits, short, and long term effects of radiotherapy, as well as the palliative intent, and the patient is interested in proceeding. I reviewed the delivery and logistics of radiotherapy and Dr. Lisbeth Renshaw recommends a course of 2 weeks of radiotherapy. The patient will be contacted to coordinate treatment planning by our simulation department but we would like to start treatment on 02/08/22 so he can still plan to go on a scheduled vacation.

## 2022-02-02 NOTE — Progress Notes (Signed)
[place] Telephone:([phone]) [phone]   [phone]  OFFICE PROGRESS NOTE  Anthony Melter, MD [place] [address]  DIAGNOSIS: Stage IV (pT1c, N1, M1b) large cell neuroendocrine carcinoma.  He presented with a right upper lobe lung nodule and hilar lymphadenopathy.  He was diagnosed in April 2022. He also had a small metastatic brain lesion.  The patient has local recurrence of his disease and the right chest wall diagnosed in November 2022. Molecular studies by foundation 1 showed no actionable mutations   PDL1: 0%   PRIOR THERAPY: 1) Robotic assisted thoracic surgery with a right upper lobectomy which was performed on 12/24/2020.  2) SRS to a small metastatic brain lesion completed on February 13, 2021 under the care of Dr. Lisbeth Renshaw.   3) Adjuvant chemotherapy with cisplatin 80 mg/m2 on day 1, etoposide 100 mg per metered squared on days 1, 2, and 3, IV every 3 weeks. First dose on 02/09/21.  Status post 4 cycles. 4) SRS to 4 new brain metastasis under the care of Dr. Lisbeth Renshaw, Last dose on 08/26/21 5) Palliative radiation to right chest wall from 09/01/21-09/23/21 under the care of Dr. Lisbeth Renshaw 6) SRS to the new brain lesion along the thalamus under the care of Dr. Lisbeth Renshaw. Completed on 10/20/21   CURRENT THERAPY:  1) palliative systemic chemotherapy with carboplatin for an AUC of 5, Alimta 500 mg per metered square, Keytruda 200 mg IV every 3 weeks.  First dose on 09/29/21.  Carboplatin has been removed from cycle #2 due to pancytopenia.  Status post 6 cycles 2) SRS to the new brain lesion along the thalamus under the care of Dr. Lisbeth Renshaw. Completed on 10/20/21  INTERVAL HISTORY: [name] 66 y.o. male returns to the clinic today for follow-up visit accompanied by his wife.  The patient continues to complain of generalized fatigue and weakness as well as intermittent nausea.  He mentioned that he did not feel well the last few weeks.  He was seen at the  emergency department several times recently and had repeat CT scan of the abdomen pelvis at least twice in 1 week.  He was complaining of acute abdominal pain at that time.  His imaging studies were unremarkable except for suspicious infectious/inflammatory colitis.  He is feeling much better now.  He denied having any current chest pain, shortness of breath except with exertion with no cough or hemoptysis.  He has intermittent nausea but no vomiting, diarrhea or constipation.  He has no recent headache or visual changes.  He had MRI of the brain performed last week that showed multiple new metastatic brain lesions.  The patient is here today for evaluation before starting cycle #7 of his treatment.  MEDICAL HISTORY: Past Medical History:  Diagnosis Date   Anemia    Anxiety    Arthritis    Basal cell carcinoma (BCC) of forehead    CAD (coronary artery disease)    a. 10/2015 ant STEMI >> LHC with 3 v CAD; oLAD tx with POBA >> emergent CABG. b. Multiple evals since that time, early graft failure of SVG-RCA by cath 03/2016. c. 2/19 PCI/DES x1 to pRCA, normal EF.   Carotid artery disease ([place])    a. 40-59% BICA 02/2018.   Depression    Dyspnea    Ectopic atrial tachycardia (HCC)    Esophageal reflux    eosinophil esophagitis   Family history of adverse reaction to anesthesia    "  sister has PONV" (06/21/2017)   Former tobacco use    Gout    Hepatitis C    "treated and cured" (06/21/2017)   High cholesterol    History of blood transfusion    History of kidney stones    Hypertension    Ischemic cardiomyopathy    a. EF 25-30% at intraop TEE 4/17  //  b. Limited Echo 5/17 - EF 45-50%, mild ant HK. c. EF 55-65% by cath 09/2017.   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[place])    Migraine    "3-4/yr" (06/21/2017)   Myocardial infarction ([place]) 10/2015   Palpitations    Pneumonia    Sinus bradycardia    a. HR dropping into 40s in 02/2016 -> BB reduced.   Stroke [facility]) 10/2016   "small one; sometimes  my memory/cognitive issues" (06/21/2017)   Symptomatic hypotension    a. 02/2016 ER visit -> meds reduced.   Syncope    Wears dentures    Wears glasses     ALLERGIES:  is allergic to prednisone, tetanus toxoids, wellbutrin [bupropion], and varenicline.  MEDICATIONS:  Current Outpatient Medications  Medication Sig Dispense Refill   acetaminophen (TYLENOL) 500 MG tablet Take 1,000 mg by mouth every 6 (six) hours as needed for mild pain, fever or headache.     aspirin EC 81 MG tablet Take 81 mg by mouth daily. Swallow whole.     benzonatate (TESSALON) 100 MG capsule Take 1 capsule (100 mg total) by mouth 3 (three) times daily as needed for cough. 30 capsule 0   colchicine 0.6 MG tablet Take by mouth. (Patient not taking: Reported on 11/30/2021)     diclofenac (VOLTAREN) 25 MG EC tablet Take by mouth.     diphenhydrAMINE (BENADRYL) 25 MG tablet Take 1 tablet (25 mg total) by mouth every 6 (six) hours as needed. 30 tablet 0   docusate sodium (COLACE) 100 MG capsule Take 100-200 mg by mouth daily.     DULoxetine (CYMBALTA) 30 MG capsule Take 1 capsule (30 mg total) by mouth 2 (two) times daily. 60 capsule 1   ferrous gluconate (FERGON) 324 MG tablet Take by mouth daily.     folic acid (FOLVITE) 1 MG tablet Take 1 tablet (1 mg total) by mouth daily. 30 tablet 4   gabapentin (NEURONTIN) 300 MG capsule Take 1 capsule (300 mg total) by mouth 3 (three) times daily. (Patient not taking: Reported on 01/28/2022) 180 capsule 2   HYDROmorphone (DILAUDID) 4 MG tablet Take 2 tablets by mouth every 6 hours as needed for severe pain. (01/22/22) 90 tablet 0   lidocaine (LIDODERM) 5 % Place 1 patch onto the skin daily as needed (for pain- Remove & Discard patch within 12 hours or as directed by MD).     lidocaine-prilocaine (EMLA) cream Apply to the Port-A-Cath site 30-60-minute before treatment 30 g 0   magnesium oxide (MAG-OX) 400 (240 Mg) MG tablet Take 1 tablet by mouth daily.     metoCLOPramide (REGLAN) 10 MG  tablet Take 1 tablet (10 mg total) by mouth every 6 (six) hours. 30 tablet 0   metoprolol succinate (TOPROL-XL) 25 MG 24 hr tablet Take 1 tablet (25 mg total) by mouth daily. (Patient taking differently: Take 25 mg by mouth every evening.) 90 tablet 3   morphine (MS CONTIN) 60 MG 12 hr tablet Take 1 tablet by mouth every 12 hours. (01/29/22) 60 tablet 0   Multiple Vitamin (MULTIVITAMIN WITH MINERALS) TABS tablet Take 1 tablet by mouth  daily.     ondansetron (ZOFRAN) 8 MG tablet Take 1 tablet by mouth every 8 hours as needed for nausea or vomiting. Starting 3 days after chemotherapy 90 tablet 2   pantoprazole (PROTONIX) 40 MG tablet Take 1 tablet by mouth daily at 6 AM. 30 tablet 1   polyethylene glycol (MIRALAX / GLYCOLAX) 17 g packet Take 17 g by mouth daily. (Patient not taking: Reported on 11/27/2021) 14 each 0   prochlorperazine (COMPAZINE) 10 MG tablet Take 10 mg by mouth every 6 (six) hours as needed.     senna-docusate (SENOKOT-S) 8.6-50 MG tablet Take 1 tablet by mouth 2 (two) times daily. (Patient not taking: Reported on 12/21/2021)     tiZANidine (ZANAFLEX) 2 MG tablet Take 1 tablet (2 mg total) by mouth at bedtime as needed for muscle spasms. 30 tablet 2   XARELTO 20 MG TABS tablet Take 20 mg by mouth every evening.     No current facility-administered medications for this visit.    SURGICAL HISTORY:  Past Surgical History:  Procedure Laterality Date   25 GAUGE PARS PLANA VITRECTOMY WITH 20 GAUGE MVR PORT  12/31/2020   Procedure: 25 GAUGE PARS PLANA VITRECTOMY WITH 20 GAUGE MVR PORT;  Surgeon: [name], MD;  [facility]: [facility];  Service: Thoracic;;   ANTERIOR CERVICAL DECOMP/DISCECTOMY FUSION N/A 10/17/2018   Procedure: Anterior Cervical Decompression Fusion - Cervical seven -Thoracic one;  Surgeon: Consuella Lose, MD;  Location: [place];  Service: Neurosurgery;  Laterality: N/A;   BASAL CELL CARCINOMA EXCISION     "forehead   BIOPSY  07/20/2019   Procedure: BIOPSY;   Surgeon: Carol Ada, MD;  Location: [facility];  Service: Endoscopy;;   BIOPSY OF MEDIASTINAL MASS Right 07/29/2021   Procedure: RIGHT CHEST WALL MASS BIOPSY;  Surgeon: [name], MD;  Location: [place];  Service: Thoracic;  Laterality: Right;   BRONCHIAL BIOPSY  12/24/2020   Procedure: BRONCHIAL BIOPSIES;  Surgeon: Garner Nash, DO;  Location: [place] [facility];  Service: Pulmonary;;   BRONCHIAL BRUSHINGS  12/24/2020   Procedure: BRONCHIAL BRUSHINGS;  Surgeon: Garner Nash, DO;  Location: [place];  Service: Pulmonary;;   BRONCHIAL NEEDLE ASPIRATION BIOPSY  12/24/2020   Procedure: BRONCHIAL NEEDLE ASPIRATION BIOPSIES;  Surgeon: Garner Nash, DO;  Location: [place];  Service: Pulmonary;;   CARDIAC CATHETERIZATION N/A 11/28/2015   Procedure: Left Heart Cath and Coronary Angiography;  Surgeon: [name], MD;  Location: [place] [facility];  Service: Cardiovascular;  Laterality: N/A;   CARDIAC CATHETERIZATION N/A 11/28/2015   Procedure: Coronary Balloon Angioplasty;  Surgeon: [name], MD;  Location: [place] [facility];  Service: Cardiovascular;  Laterality: N/A;  ostial LAD   CARDIAC CATHETERIZATION N/A 11/28/2015   Procedure: Coronary/Graft Angiography;  Surgeon: [name], MD;  Location: [place] [facility];  Service: Cardiovascular;  Laterality: N/A;  coronaries only    CARDIAC CATHETERIZATION N/A 04/21/2016   Procedure: Left Heart Cath and Coronary Angiography;  Surgeon: Wellington Hampshire, MD;  Location: [place] [facility];  Service: Cardiovascular;  Laterality: N/A;   CARDIAC CATHETERIZATION N/A 06/14/2016   Procedure: Left Heart Cath and Cors/Grafts Angiography;  Surgeon: Lorretta Harp, MD;  Location: [place] [facility];  Service: Cardiovascular;  Laterality: N/A;   CARDIAC CATHETERIZATION N/A 09/08/2016   Procedure: Left Heart Cath and Cors/Grafts Angiography;  Surgeon: Wellington Hampshire, MD;  Location: [place] [facility];  Service:  Cardiovascular;  Laterality: N/A;  CARDIAC CATHETERIZATION     CORONARY ARTERY BYPASS GRAFT N/A 11/28/2015   Procedure: CORONARY ARTERY BYPASS GRAFTING (CABG) TIMES FIVE USING LEFT INTERNAL MAMMARY ARTERY AND RIGHT GREATER SAPHENOUS,VIEN HARVEATED BY ENDOVIEN, INTRAOPPRATIVE TEE;  Surgeon: Gaye Pollack, MD;  Location: [place];  Service: Open Heart Surgery;  Laterality: N/A;   CORONARY STENT INTERVENTION N/A 10/05/2017   Procedure: CORONARY STENT INTERVENTION;  Surgeon: [name], MD;  Location: [place] [facility];  Service: Cardiovascular;  Laterality: N/A;   ESOPHAGOGASTRODUODENOSCOPY (EGD) WITH PROPOFOL N/A 07/20/2019   Procedure: ESOPHAGOGASTRODUODENOSCOPY (EGD) WITH PROPOFOL;  Surgeon: Carol Ada, MD;  [facility];  Service: Endoscopy;  Laterality: N/A;   ESOPHAGOGASTRODUODENOSCOPY (EGD) WITH PROPOFOL N/A 01/30/2021   Procedure: ESOPHAGOGASTRODUODENOSCOPY (EGD) WITH PROPOFOL;  Surgeon: Carol Ada, MD;  [facility];  Service: Endoscopy;  Laterality: N/A;   FIDUCIAL MARKER PLACEMENT  12/24/2020   Procedure: FIDUCIAL DYE MARKER PLACEMENT;  Surgeon: Garner Nash, DO;  Location: [place] [facility];  Service: Pulmonary;;   HUMERUS SURGERY Right 1969   "tumor inside bone; filled it w/bone chips"   INTERCOSTAL NERVE BLOCK Right 12/24/2020   Procedure: INTERCOSTAL NERVE BLOCK;  Surgeon: [name], MD;  Location: [employer];  Service: Thoracic;  Laterality: Right;   IR IMAGING GUIDED PORT INSERTION  02/06/2021   LEFT HEART CATH AND CORS/GRAFTS ANGIOGRAPHY N/A 03/11/2017   Procedure: Left Heart Cath and Cors/Grafts Angiography;  Surgeon: Leonie Man, MD;  Location: [place] [facility];  Service: Cardiovascular;  Laterality: N/A;   LEFT HEART CATH AND CORS/GRAFTS ANGIOGRAPHY N/A 10/05/2017   Procedure: LEFT HEART CATH AND CORS/GRAFTS ANGIOGRAPHY;  Surgeon: [name], MD;  Location: [place] [facility];  Service: Cardiovascular;  Laterality: N/A;   LEFT  HEART CATH AND CORS/GRAFTS ANGIOGRAPHY N/A 04/11/2019   Procedure: LEFT HEART CATH AND CORS/GRAFTS ANGIOGRAPHY;  Surgeon: [name], MD;  Location: [place] [facility];  Service: Cardiovascular;  Laterality: N/A;   NODE DISSECTION Right 12/24/2020   Procedure: NODE DISSECTION;  Surgeon: [name], MD;  Location: [place];  Service: Thoracic;  Laterality: Right;   PERIPHERAL VASCULAR CATHETERIZATION N/A 06/14/2016   Procedure: Lower Extremity Angiography;  Surgeon: Lorretta Harp, MD;  Location: [place] [facility];  Service: Cardiovascular;  Laterality: N/A;   VIDEO BRONCHOSCOPY WITH ENDOBRONCHIAL NAVIGATION Right 12/24/2020   Procedure: VIDEO BRONCHOSCOPY WITH ENDOBRONCHIAL NAVIGATION;  Surgeon: Garner Nash, DO;  Location: [place];  Service: Pulmonary;  Laterality: Right;   VIDEO BRONCHOSCOPY WITH ENDOBRONCHIAL ULTRASOUND N/A 12/24/2020   Procedure: VIDEO BRONCHOSCOPY WITH ENDOBRONCHIAL ULTRASOUND;  Surgeon: Garner Nash, DO;  Location: [place];  Service: Pulmonary;  Laterality: N/A;   VIDEO BRONCHOSCOPY WITH INSERTION OF INTERBRONCHIAL VALVE (IBV) N/A 12/31/2020   Procedure: VIDEO BRONCHOSCOPY WITH INSERTION OF INTERBRONCHIAL VALVE (IBV).VALVE IN CARTRIDGE 54m,9mm. CHEST TUBE PLACEMENT.;  Surgeon: LLajuana Matte MD;  [facility];  Service: Thoracic;  Laterality: N/A;   VIDEO BRONCHOSCOPY WITH INSERTION OF INTERBRONCHIAL VALVE (IBV) N/A 07/29/2021   Procedure: VIDEO BRONCHOSCOPY WITH REMOVAL OF INTERBRONCHIAL VALVE (IBV);  Surgeon: LLajuana Matte MD;  [facility]  Service: Thoracic;  Laterality: N/A;    REVIEW OF SYSTEMS:  Constitutional: positive for fatigue Eyes: negative Ears, nose, mouth, throat, and face: negative Respiratory: positive for dyspnea on exertion Cardiovascular: negative Gastrointestinal: positive for nausea Genitourinary:negative Integument/breast: negative Hematologic/lymphatic:  negative Musculoskeletal:negative Neurological: negative Behavioral/Psych: negative Endocrine: negative Allergic/Immunologic: negative   PHYSICAL EXAMINATION: General appearance: alert, cooperative,  fatigued, and no distress Head: Normocephalic, without obvious abnormality, atraumatic Neck: no adenopathy, no JVD, supple, symmetrical, trachea midline, and thyroid not enlarged, symmetric, no tenderness/mass/nodules Lymph nodes: Cervical, supraclavicular, and axillary nodes normal. Resp: clear to auscultation bilaterally Back: symmetric, no curvature. ROM normal. No CVA tenderness. Cardio: regular rate and rhythm, S1, S2 normal, no murmur, click, rub or gallop GI: soft, non-tender; bowel sounds normal; no masses,  no organomegaly Extremities: extremities normal, atraumatic, no cyanosis or edema Neurologic: Alert and oriented X 3, normal strength and tone. Normal symmetric reflexes. Normal coordination and gait  ECOG PERFORMANCE STATUS: 1 - Symptomatic but completely ambulatory  Blood pressure 119/81, pulse (!) 109, temperature (!) 97.2 F (36.2 C), temperature source Tympanic, resp. rate 18, height _0  (1.753 m), weight 154 lb 14.4 oz (70.3 kg), SpO2 98 %.  LABORATORY DATA: Lab Results  Component Value Date   WBC 9.6 02/02/2022   HGB 11.0 (L) 02/02/2022   HCT 33.2 (L) 02/02/2022   MCV 99.4 02/02/2022   PLT 297 02/02/2022      Chemistry      Component Value Date/Time   NA 135 02/02/2022 1002   K 4.0 02/02/2022 1002   CL 103 02/02/2022 1002   CO2 25 02/02/2022 1002   BUN 17 02/02/2022 1002   CREATININE 0.88 02/02/2022 1002   CREATININE 1.00 02/12/2016 1038      Component Value Date/Time   CALCIUM 10.2 02/02/2022 1002   ALKPHOS 111 02/02/2022 1002   AST 20 02/02/2022 1002   ALT 15 02/02/2022 1002   BILITOT 0.5 02/02/2022 1002       RADIOGRAPHIC STUDIES: MR Brain W Wo Contrast  Result Date: 01/27/2022 CLINICAL DATA:  66 year old male with metastatic large cell  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 Prior SRS most recently 10/27/2021. Restaging. EXAM: MRI HEAD WITHOUT AND WITH CONTRAST TECHNIQUE: Multiplanar, multiecho pulse sequences of the brain and surrounding structures were obtained without and with intravenous contrast. CONTRAST:  75m MULTIHANCE GADOBENATE DIMEGLUMINE 529 MG/ML IV SOLN COMPARISON:  Prior brain MRI 10/16/2021 and earlier. FINDINGS: BRAIN New Lesions: Approximately 15 new small enhancing metastases (at least 6 in the cerebellum alone). Many are punctate. There are 2 adjacent lesions in the inferior medial right occipital lobe on series 15, image 58, each 4-5 mm. And questionable additional small new metastasis along the inferior right temporal lobe on series 15, image 53. All lesions annotated with double arrows on series 15. Larger lesions: The right perirolandic lesion on series 15, image 147 has increased from about 4 mm in February 2 7 mm now. Adjacent T2 and FLAIR hyperintense vasogenic edema has also increased but is mild (series 12, image 46). Left lateral cerebellar enhancing metastasis on series 15, image 45 appears slightly larger and more solidly enhancing. Surrounding edema also appears slightly increased on series 12, image 13. Stable or Smaller lesions: Anterior left thalamic lesion has regressed on series 15, image 102, now punctate. Mild regional edema has resolved. Punctate left parietal lobe lesion seen previously on series 11, image 129 is not evident today. And the left middle frontal gyrus lesion previously seen on series 11, image 146 is not evident. Other Brain findings: No significant intracranial mass effect. No midline shift. No superimposed restricted diffusion to suggest acute infarction. No ventriculomegaly, extra-axial collection or acute intracranial hemorrhage. Cervicomedullary junction and pituitary are within normal limits. Mild hemosiderin associated with treated metastases is stable. No  dural thickening is identified. Vascular: Major intracranial vascular flow  voids are stable. On series 16 the major dural venous sinuses are enhancing and appear to be patent. Skull and upper cervical spine: Negative visible cervical spinal cord. Visualized bone marrow signal is within normal limits. Sinuses/Orbits: Stable, negative. Other: Visible internal auditory structures appear normal. Visible scalp and face appear negative. IMPRESSION: 1. Progression of disease. 15-16 small new brain metastases since February, many punctate. The largest are 5 mm. All lesions annotated on series 15. 2. Two slightly larger treated metastases: Right perirolandic and left lateral cerebellum. The former has conspicuously increased surrounding edema but there is no significant mass effect. 3. 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 Electronically Signed   By: Genevie Ann M.D.   On: 01/27/2022 09:22   CT ABDOMEN PELVIS W CONTRAST  Result Date: 01/14/2022 CLINICAL DATA:  Abdominal pain, acute, nonlocalized. History of non-small cell lung cancer EXAM: CT ABDOMEN AND PELVIS WITH CONTRAST TECHNIQUE: Multidetector CT imaging of the abdomen and pelvis was performed using the standard protocol following bolus administration of intravenous contrast. RADIATION DOSE REDUCTION: This exam was performed according to the departmental dose-optimization program which includes automated exposure control, adjustment of the mA and/or kV according to patient size and/or use of iterative reconstruction technique. CONTRAST:  135m OMNIPAQUE IOHEXOL 300 MG/ML  SOLN COMPARISON:  CT 01/10/2022, 12/17/2021, 10/02/2021 FINDINGS: Lower chest: Chronic small right pleural effusion with associated right pleural thickening. Patchy right lower lobe airspace opacities. Multiple small pulmonary nodules within the left lung base stable in appearance from prior and compatible with known metastatic disease. Hepatobiliary: No solid liver abnormality is seen. No  gallstones, gallbladder wall thickening, or biliary dilatation. Pancreas: Unremarkable. No pancreatic ductal dilatation or surrounding inflammatory changes. Spleen: Normal in size without focal abnormality. Adrenals/Urinary Tract: Adrenal glands are unremarkable. Kidneys are normal, without renal calculi, solid lesion, or hydronephrosis. Bladder is unremarkable. Stomach/Bowel: Long segment circumferential wall thickening of the ascending and proximal transverse colon with mild pericolonic fat stranding. Normal appendix in the right lower quadrant (series 2, image 59). No dilated loops of bowel. Stomach within normal limits. Vascular/Lymphatic: Aortic atherosclerosis. No enlarged abdominal or pelvic lymph nodes. Reproductive: No mass or other significant abnormality. Other: No free fluid. No abdominopelvic fluid collection. No pneumoperitoneum. No abdominal wall hernia. Musculoskeletal: Chronic deformities of the posterior right eleventh and twelfth ribs, unchanged. No new focal bony abnormality. Focal areas of nodularity within the soft tissues of the posterior lower right chest wall posterior to the right eleventh and twelfth ribs at least partially within the erector spinae musculature (series 2, images 29-32). These have decreased in size compared to 10/02/2021. IMPRESSION: 1. Long segment circumferential wall thickening of the ascending and proximal transverse colon with mild pericolonic fat stranding, suggestive of an infectious or inflammatory colitis. 2. Chronic small right pleural effusion with associated right pleural thickening. Patchy right lower lobe airspace opacities which could represent atelectasis and/or pneumonia. 3. Multiple small pulmonary nodules within the left lung base stable in appearance from prior and compatible with known metastatic disease. 4. Focal areas of soft tissue nodularity of the posterior lower right chest wall, stable in appearance from prior and decreased in size compared to  10/02/2021. Aortic Atherosclerosis (ICD10-I70.0). Electronically Signed   By: NDavina PokeD.O.   On: 01/14/2022 14:52   CT ABDOMEN PELVIS W CONTRAST  Result Date: 01/10/2022 CLINICAL DATA:  Abdominal pain, nausea and vomiting for 2 days, ongoing chemotherapy, metastatic lung cancer * Tracking Code: BO * EXAM: CT ABDOMEN AND PELVIS WITH CONTRAST TECHNIQUE: Multidetector CT  imaging of the abdomen and pelvis was performed using the standard protocol following bolus administration of intravenous contrast. RADIATION DOSE REDUCTION: This exam was performed according to the departmental dose-optimization program which includes automated exposure control, adjustment of the mA and/or kV according to patient size and/or use of iterative reconstruction technique. CONTRAST:  164m OMNIPAQUE IOHEXOL 300 MG/ML  SOLN COMPARISON:  12/17/2021 FINDINGS: Lower chest: Small, chronic, loculated right pleural effusion with associated pleural thickening. Coronary artery calcifications. Hepatobiliary: No solid liver abnormality is seen. No gallstones, gallbladder wall thickening, or biliary dilatation. Pancreas: Unremarkable. No pancreatic ductal dilatation or surrounding inflammatory changes. Spleen: Normal in size without significant abnormality. Adrenals/Urinary Tract: Adrenal glands are unremarkable. Kidneys are normal, without renal calculi, solid lesion, or hydronephrosis. Bladder is unremarkable. Stomach/Bowel: Stomach is within normal limits. Appendix appears normal. No evidence of bowel wall thickening, distention, or inflammatory changes. Sigmoid diverticula. Vascular/Lymphatic: Aortic atherosclerosis. No enlarged abdominal or pelvic lymph nodes. Reproductive: No mass or other significant abnormality. Other: No abdominal wall hernia or abnormality. No ascites. Musculoskeletal: No acute osseous findings. Unchanged sclerotic fractures of the posterior right eleventh and twelfth ribs (series 2, image 13). IMPRESSION: 1. No  acute CT findings of the abdomen or pelvis to explain abdominal pain, nausea or vomiting. 2. Sigmoid diverticulosis without evidence of acute diverticulitis. 3. Small, chronic, loculated right pleural effusion with associated pleural thickening. 4. Coronary artery disease. Aortic Atherosclerosis (ICD10-I70.0). Electronically Signed   By: ADelanna AhmadiM.D.   On: 01/10/2022 11:33   DG Chest Portable 1 View  Result Date: 01/14/2022 CLINICAL DATA:  Stage IV lung cancer, hematemesis EXAM: PORTABLE CHEST 1 VIEW COMPARISON:  Prior chest x-ray 10/02/2021; CT scan of the chest 12/17/2021 FINDINGS: Right IJ approach single-lumen power injectable port catheter remains in good position with the tip overlying the cavoatrial junction. Patient is status post median sternotomy with evidence of multivessel CABG. Chronic elevation of the right hemidiaphragm with probable chronic small right pleural effusion. Multiple small pulmonary nodules better seen on recent CT imaging. No pneumothorax. No new focal airspace infiltrate. IMPRESSION: 1. Stable chest x-ray without evidence of acute cardiopulmonary process. 2. Stable chronic right pleural effusion and associated elevation of the right hemidiaphragm. 3. Multiple small pulmonary nodules better demonstrated on recent CT imaging. Electronically Signed   By: HJacqulynn CadetM.D.   On: 01/14/2022 12:39    ASSESSMENT AND PLAN: This is 66years old white male recently diagnosed with a stage IV (T1c, N1, M1 B) large cell neuroendocrine carcinoma presented with right upper lobe lung nodule in addition to right hilar adenopathy in April 2022 and the patient was found to have solitary brain metastasis in May 2022 He is status post right upper lobectomy with lymph node sampling under the care of Dr. LKipp Brood The patient underwent systemic chemotherapy with cisplatin 80 mg/M2 on day 1 and etoposide 100 Mg/M2 on days 1, 2 and 3 every 3 weeks.  He is status post 4 cycles.  He has a  rough time tolerating his adjuvant systemic chemotherapy and it was discontinued after cycle #4. He is currently on observation but continues to have pain in the right side of the chest.  He was seen by Dr. LKipp Broodand underwent resection of the chest wall nodule and the final pathology was consistent with local recurrence of his disease and the right chest wall at the site of one of the chest wall tube. The patient was also found on recent MRI of the brain to have 4  new small brain metastasis that were treated with SRS. He completed palliative radiotherapy to the local disease recurrence in the right side of the chest wall likely from implants after his surgical resection. He is currently undergoing systemic chemotherapy with carboplatin for AUC of 5, Alimta 500 Mg/M2 and Keytruda 200 Mg IV every 3 weeks status post 6 cycles.  Starting cycle #2 carboplatin was discontinued secondary to significant pancytopenia.   The patient continues to complain of increasing fatigue and weakness with this treatment.  He also was seen at the emergency department few times recently with abdominal pain nausea and vomiting and was treated for suspicious colitis.  He is feeling much better today. I had a lengthy discussion with the patient and his wife today about his condition and treatment options.  I gave the patient the option of discontinuing his treatment with Alimta and treating him with just single agent Keytruda to avoid some of the adverse effect that he had suffered recently but the patient declined and he would like to continue with the combination for now.  He will have repeat CT scan of the chest in around 3 weeks from now for restaging of his disease. For the multiple new metastatic brain lesions, he was supposed to see Dr. Lisbeth Renshaw for consideration of whole brain irradiation.  He would like more information from Dr. Lisbeth Renshaw about the whole brain irradiation before proceeding with that. For the pain management, he  will continue his current treatment with MS Contin, MS IR and gabapentin by the palliative care team. The patient will come back for follow-up visit in 3 weeks for evaluation before the next cycle of his treatment. He was advised to call immediately if he has any other concerning symptoms in the interval. The patient voices understanding of current disease status and treatment options and is in agreement with the current care plan.  All questions were answered. The patient knows to call the clinic with any problems, questions or concerns. We can certainly see the patient much sooner if necessary.  The total time spent in the appointment was 35 minutes.  Disclaimer: This note was dictated with voice recognition software. Similar sounding words can inadvertently be transcribed and may not be corrected upon review.

## 2022-02-02 NOTE — Patient Instructions (Signed)
[facility]  Discharge Instructions: Thank you for choosing [facility] to provide your oncology and hematology care.   If you have a lab appointment with the [facility], please go directly to the [facility] and check in at the registration area.   Wear comfortable clothing and clothing appropriate for easy access to any Portacath or PICC line.   We strive to give you quality time with your provider. You may need to reschedule your appointment if you arrive late (15 or more minutes).  Arriving late affects you and other patients whose appointments are after yours.  Also, if you miss three or more appointments without notifying the office, you may be dismissed from the clinic at the provider's discretion.      For prescription refill requests, have your pharmacy contact our office and allow 72 hours for refills to be completed.    Today you received the following chemotherapy and/or immunotherapy agents: Keytruda and Alimta      To help prevent nausea and vomiting after your treatment, we encourage you to take your nausea medication as directed.  BELOW ARE SYMPTOMS THAT SHOULD BE REPORTED IMMEDIATELY: *FEVER GREATER THAN 100.4 F (38 C) OR HIGHER *CHILLS OR SWEATING *NAUSEA AND VOMITING THAT IS NOT CONTROLLED WITH YOUR NAUSEA MEDICATION *UNUSUAL SHORTNESS OF BREATH *UNUSUAL BRUISING OR BLEEDING *URINARY PROBLEMS (pain or burning when urinating, or frequent urination) *BOWEL PROBLEMS (unusual diarrhea, constipation, pain near the anus) TENDERNESS IN MOUTH AND THROAT WITH OR WITHOUT PRESENCE OF ULCERS (sore throat, sores in mouth, or a toothache) UNUSUAL RASH, SWELLING OR PAIN  UNUSUAL VAGINAL DISCHARGE OR ITCHING   Items with * indicate a potential emergency and should be followed up as soon as possible or go to the Emergency Department if any problems should occur.  Please show the CHEMOTHERAPY ALERT CARD or IMMUNOTHERAPY ALERT CARD at  check-in to the Emergency Department and triage nurse.  Should you have questions after your visit or need to cancel or reschedule your appointment, please contact [facility]  Dept: [phone]  and follow the prompts.  Office hours are 8:00 a.m. to 4:30 p.m. Monday - Friday. Please note that voicemails left after 4:00 p.m. may not be returned until the following business day.  We are closed weekends and major holidays. You have access to a nurse at all times for urgent questions. Please call the main number to the clinic Dept: [phone] and follow the prompts.   For any non-urgent questions, you may also contact your provider using MyChart. We now offer e-Visits for anyone 55 and older to request care online for non-urgent symptoms. For details visit [email or web address].   Also download the MyChart app! Go to the app store, search "MyChart", open the app, select [facility], and log in with your MyChart username and password.  Due to Covid, a mask is required upon entering the hospital/clinic. If you do not have a mask, one will be given to you upon arrival. For doctor visits, patients may have 1 support person aged 10 or older with them. For treatment visits, patients cannot have anyone with them due to current Covid guidelines and our immunocompromised population.   Dehydration, Adult Dehydration is a condition in which there is not enough water or other fluids in the body. This happens when a person loses more fluids than he or she takes in. Important organs, such as the kidneys, brain, and heart, cannot function without a proper  amount of fluids. Any loss of fluids from the body can lead to dehydration. Dehydration can be mild, moderate, or severe. It should be treated right away to prevent it from becoming severe. What are the causes? Dehydration may be caused by: Conditions that cause loss of water or other fluids, such as diarrhea, vomiting, or sweating  or urinating a lot. Not drinking enough fluids, especially when you are ill or doing activities that require a lot of energy. Other illnesses and conditions, such as fever or infection. Certain medicines, such as medicines that remove excess fluid from the body (diuretics). Lack of safe drinking water. Not being able to get enough water and food. What increases the risk? The following factors may make you more likely to develop this condition: Having a long-term (chronic) illness that has not been treated properly, such as diabetes, heart disease, or kidney disease. Being 49 years of age or older. Having a disability. Living in a place that is high in altitude, where thinner, drier air causes more fluid loss. Doing exercises that put stress on your body for a long time (endurance sports). What are the signs or symptoms? Symptoms of dehydration depend on how severe it is. Mild or moderate dehydration Thirst. Dry lips or dry mouth. Dizziness or light-headedness, especially when standing up from a seated position. Muscle cramps. Dark urine. Urine may be the color of tea. Less urine or tears produced than usual. Headache. Severe dehydration Changes in skin. Your skin may be cold and clammy, blotchy, or pale. Your skin also may not return to normal after being lightly pinched and released. Little or no tears, urine, or sweat. Changes in vital signs, such as rapid breathing and low blood pressure. Your pulse may be weak or may be faster than 100 beats a minute when you are sitting still. Other changes, such as: Feeling very thirsty. Sunken eyes. Cold hands and feet. Confusion. Being very tired (lethargic) or having trouble waking from sleep. Short-term weight loss. Loss of consciousness. How is this diagnosed? This condition is diagnosed based on your symptoms and a physical exam. You may have blood and urine tests to help confirm the diagnosis. How is this treated? Treatment for this  condition depends on how severe it is. Treatment should be started right away. Do not wait until dehydration becomes severe. Severe dehydration is an emergency and needs to be treated in a hospital. Mild or moderate dehydration can be treated at home. You may be asked to: Drink more fluids. Drink an oral rehydration solution (ORS). This drink helps restore proper amounts of fluids and salts and minerals in the blood (electrolytes). Severe dehydration can be treated: With IV fluids. By correcting abnormal levels of electrolytes. This is often done by giving electrolytes through a tube that is passed through your nose and into your stomach (nasogastric tube, or NG tube). By treating the underlying cause of dehydration. Follow these instructions at home: Oral rehydration solution If told by your health care provider, drink an ORS: Make an ORS by following instructions on the package. Start by drinking small amounts, about  cup (120 mL) every 5-10 minutes. Slowly increase how much you drink until you have taken the amount recommended by your health care provider. Eating and drinking        Drink enough clear fluid to keep your urine pale yellow. If you were told to drink an ORS, finish the ORS first and then start slowly drinking other clear fluids. Drink fluids such as:  Water. Do not drink only water. Doing that can lead to hyponatremia, which is having too little salt (sodium) in the body. Water from ice chips you suck on. Fruit juice that you have added water to (diluted fruit juice). Low-calorie sports drinks. Eat foods that contain a healthy balance of electrolytes, such as bananas, oranges, potatoes, tomatoes, and spinach. Do not drink alcohol. Avoid the following: Drinks that contain a lot of sugar. These include high-calorie sports drinks, fruit juice that is not diluted, and soda. Caffeine. Foods that are greasy or contain a lot of fat or sugar. General instructions Take  over-the-counter and prescription medicines only as told by your health care provider. Do not take sodium tablets. Doing that can lead to having too much sodium in the body (hypernatremia). Return to your normal activities as told by your health care provider. Ask your health care provider what activities are safe for you. Keep all follow-up visits as told by your health care provider. This is important. Contact a health care provider if: You have muscle cramps, pain, or discomfort, such as: Pain in your abdomen and the pain gets worse or stays in one area (localizes). Stiff neck. You have a rash. You are more irritable than usual. You are sleepier or have a harder time waking than usual. You feel weak or dizzy. You feel very thirsty. Get help right away if you have: Any symptoms of severe dehydration. Symptoms of vomiting, such as: You cannot eat or drink without vomiting. Vomiting gets worse or does not go away. Vomit includes blood or green matter (bile). Symptoms that get worse with treatment. A fever. A severe headache. Problems with urination or bowel movements, such as: Diarrhea that gets worse or does not go away. Blood in your stool (feces). This may cause stool to look black and tarry. Not urinating, or urinating only a small amount of very dark urine, within 6-8 hours. Trouble breathing. These symptoms may represent a serious problem that is an emergency. Do not wait to see if the symptoms will go away. Get medical help right away. Call your local emergency services (911 in the [place] Do not drive yourself to the hospital. Summary Dehydration is a condition in which there is not enough water or other fluids in the body. This happens when a person loses more fluids than he or she takes in. Treatment for this condition depends on how severe it is. Treatment should be started right away. Do not wait until dehydration becomes severe. Drink enough clear fluid to keep your urine  pale yellow. If you were told to drink an oral rehydration solution (ORS), finish the ORS first and then start slowly drinking other clear fluids. Take over-the-counter and prescription medicines only as told by your health care provider. Get help right away if you have any symptoms of severe dehydration. This information is not intended to replace advice given to you by your health care provider. Make sure you discuss any questions you have with your health care provider. Document Revised: 03/29/2019 Document Reviewed: 03/29/2019 Elsevier Patient Education  [place].

## 2022-02-02 NOTE — Addendum Note (Signed)
Addended by: Jimmy Footman on: 02/02/2022 02:22 PM   Modules accepted: Orders

## 2022-02-03 ENCOUNTER — Ambulatory Visit
Admission: RE | Admit: 2022-02-03 | Discharge: 2022-02-03 | Disposition: A | Discharge status: Home / Self Care | Payer: Medicare Other | Source: Ambulatory Visit | Attending: Physician Assistant | Admitting: Physician Assistant

## 2022-02-03 DIAGNOSIS — R5382 Chronic fatigue, unspecified: Secondary | ICD-10-CM | POA: Insufficient documentation

## 2022-02-03 DIAGNOSIS — C3491 Malignant neoplasm of unspecified part of right bronchus or lung: Secondary | ICD-10-CM | POA: Diagnosis present

## 2022-02-03 DIAGNOSIS — C7931 Secondary malignant neoplasm of brain: Secondary | ICD-10-CM | POA: Diagnosis present

## 2022-02-05 DIAGNOSIS — C3491 Malignant neoplasm of unspecified part of right bronchus or lung: Secondary | ICD-10-CM | POA: Diagnosis not present

## 2022-02-08 ENCOUNTER — Other Ambulatory Visit: Payer: Self-pay

## 2022-02-08 ENCOUNTER — Ambulatory Visit
Admission: RE | Admit: 2022-02-08 | Discharge: 2022-02-08 | Disposition: A | Discharge status: Home / Self Care | Payer: Medicare Other | Source: Ambulatory Visit | Attending: Radiation Oncology | Admitting: Radiation Oncology

## 2022-02-08 DIAGNOSIS — C3491 Malignant neoplasm of unspecified part of right bronchus or lung: Secondary | ICD-10-CM | POA: Diagnosis not present

## 2022-02-08 LAB — RAD ONC ARIA SESSION SUMMARY
Course Elapsed Days: 0
Plan Fractions Treated to Date: 1
Plan Prescribed Dose Per Fraction: 3 Gy
Plan Total Fractions Prescribed: 10
Plan Total Prescribed Dose: 30 Gy
Reference Point Dosage Given to Date: 3 Gy
Reference Point Session Dosage Given: 3 Gy
Session Number: 1

## 2022-02-09 ENCOUNTER — Other Ambulatory Visit: Payer: Self-pay

## 2022-02-09 ENCOUNTER — Ambulatory Visit
Admission: RE | Admit: 2022-02-09 | Discharge: 2022-02-09 | Disposition: A | Discharge status: Home / Self Care | Payer: Medicare Other | Source: Ambulatory Visit | Attending: Radiation Oncology | Admitting: Radiation Oncology

## 2022-02-09 DIAGNOSIS — C3491 Malignant neoplasm of unspecified part of right bronchus or lung: Secondary | ICD-10-CM | POA: Diagnosis not present

## 2022-02-09 LAB — RAD ONC ARIA SESSION SUMMARY
Course Elapsed Days: 1
Plan Fractions Treated to Date: 2
Plan Prescribed Dose Per Fraction: 3 Gy
Plan Total Fractions Prescribed: 10
Plan Total Prescribed Dose: 30 Gy
Reference Point Dosage Given to Date: 6 Gy
Reference Point Session Dosage Given: 3 Gy
Session Number: 2

## 2022-02-10 ENCOUNTER — Other Ambulatory Visit: Payer: Self-pay

## 2022-02-10 ENCOUNTER — Ambulatory Visit
Admission: RE | Admit: 2022-02-10 | Discharge: 2022-02-10 | Disposition: A | Discharge status: Home / Self Care | Payer: Medicare Other | Source: Ambulatory Visit | Attending: Radiation Oncology | Admitting: Radiation Oncology

## 2022-02-10 DIAGNOSIS — C3491 Malignant neoplasm of unspecified part of right bronchus or lung: Secondary | ICD-10-CM | POA: Diagnosis not present

## 2022-02-10 LAB — RAD ONC ARIA SESSION SUMMARY
Course Elapsed Days: 2
Plan Fractions Treated to Date: 3
Plan Prescribed Dose Per Fraction: 3 Gy
Plan Total Fractions Prescribed: 10
Plan Total Prescribed Dose: 30 Gy
Reference Point Dosage Given to Date: 9 Gy
Reference Point Session Dosage Given: 3 Gy
Session Number: 3

## 2022-02-11 ENCOUNTER — Other Ambulatory Visit: Payer: Self-pay

## 2022-02-11 ENCOUNTER — Ambulatory Visit
Admission: RE | Admit: 2022-02-11 | Discharge: 2022-02-11 | Disposition: A | Discharge status: Home / Self Care | Payer: Medicare Other | Source: Ambulatory Visit | Attending: Radiation Oncology | Admitting: Radiation Oncology

## 2022-02-11 DIAGNOSIS — C3491 Malignant neoplasm of unspecified part of right bronchus or lung: Secondary | ICD-10-CM | POA: Diagnosis not present

## 2022-02-11 LAB — RAD ONC ARIA SESSION SUMMARY
Course Elapsed Days: 3
Plan Fractions Treated to Date: 4
Plan Prescribed Dose Per Fraction: 3 Gy
Plan Total Fractions Prescribed: 10
Plan Total Prescribed Dose: 30 Gy
Reference Point Dosage Given to Date: 12 Gy
Reference Point Session Dosage Given: 3 Gy
Session Number: 4

## 2022-02-12 ENCOUNTER — Ambulatory Visit
Admission: RE | Admit: 2022-02-12 | Discharge: 2022-02-12 | Disposition: A | Discharge status: Home / Self Care | Payer: Medicare Other | Source: Ambulatory Visit | Attending: Radiation Oncology | Admitting: Radiation Oncology

## 2022-02-12 ENCOUNTER — Other Ambulatory Visit: Payer: Self-pay

## 2022-02-12 ENCOUNTER — Other Ambulatory Visit: Payer: Self-pay | Admitting: Radiation Oncology

## 2022-02-12 DIAGNOSIS — C3491 Malignant neoplasm of unspecified part of right bronchus or lung: Secondary | ICD-10-CM | POA: Diagnosis not present

## 2022-02-12 LAB — RAD ONC ARIA SESSION SUMMARY
Course Elapsed Days: 4
Plan Fractions Treated to Date: 5
Plan Prescribed Dose Per Fraction: 3 Gy
Plan Total Fractions Prescribed: 10
Plan Total Prescribed Dose: 30 Gy
Reference Point Dosage Given to Date: 15 Gy
Reference Point Session Dosage Given: 3 Gy
Session Number: 5

## 2022-02-12 MED ORDER — LORAZEPAM 1 MG PO TABS: 1.0000 mg | ORAL_TABLET | Freq: Three times a day (TID) | ORAL | 0 refills | Status: DC | PRN

## 2022-02-15 ENCOUNTER — Ambulatory Visit
Admission: RE | Admit: 2022-02-15 | Discharge: 2022-02-15 | Disposition: A | Discharge status: Home / Self Care | Payer: Medicare Other | Source: Ambulatory Visit | Attending: Radiation Oncology | Admitting: Radiation Oncology

## 2022-02-15 ENCOUNTER — Telehealth: Payer: Self-pay | Admitting: Interventional Cardiology

## 2022-02-15 ENCOUNTER — Other Ambulatory Visit: Payer: Self-pay

## 2022-02-15 DIAGNOSIS — C3491 Malignant neoplasm of unspecified part of right bronchus or lung: Secondary | ICD-10-CM | POA: Diagnosis not present

## 2022-02-15 LAB — RAD ONC ARIA SESSION SUMMARY
Course Elapsed Days: 7
Plan Fractions Treated to Date: 6
Plan Prescribed Dose Per Fraction: 3 Gy
Plan Total Fractions Prescribed: 10
Plan Total Prescribed Dose: 30 Gy
Reference Point Dosage Given to Date: 18 Gy
Reference Point Session Dosage Given: 3 Gy
Session Number: 6

## 2022-02-15 MED ORDER — METOPROLOL SUCCINATE ER 25 MG PO TB24: 25.0000 mg | ORAL_TABLET | Freq: Every day | ORAL | 3 refills | Status: DC

## 2022-02-15 NOTE — Telephone Encounter (Signed)
Per pt schedule message from mychart:  STAT if HR is under 50 or over 120 (normal HR is 60-100 beats per minute)   What is your heart rate? 120   Do you have a log of your heart rate readings (document readings)? "Heart rate 115 -120", when patient was speaking on the phone he stated it runs from 130-140   Do you have any other symptoms? "Breathless. Especially on stair's!"   Called patient after he responded on mychart. He now states his HR ranges 130-140

## 2022-02-15 NOTE — Telephone Encounter (Signed)
Spoke with the patient who reports that his HR has been elevated recently. States this has been going on for about 6-8 weeks. Readings have been in the 120s and jumps up as high as 140. States that his BP has been low but does not have any readings. He does feel palpitations at times. He gets winded easily when HR increases after exerting himself. He does have some dizziness and lightheadedness. States that he feels similar to how he has in the past but a bit worse this time. He missed his appointment with Dr. Irish Lack back in March due to cancer treatment. He states that he has been meaning to call us about his heart but has been more concerned about his cancer recently. Patient has not been taking his medications.  Patient scheduled to see Dr. Irish Lack on 6/21

## 2022-02-16 ENCOUNTER — Other Ambulatory Visit: Payer: Self-pay

## 2022-02-16 ENCOUNTER — Ambulatory Visit
Admission: RE | Admit: 2022-02-16 | Discharge: 2022-02-16 | Disposition: A | Discharge status: Home / Self Care | Payer: Medicare Other | Source: Ambulatory Visit | Attending: Radiation Oncology | Admitting: Radiation Oncology

## 2022-02-16 DIAGNOSIS — C3491 Malignant neoplasm of unspecified part of right bronchus or lung: Secondary | ICD-10-CM | POA: Diagnosis not present

## 2022-02-16 LAB — RAD ONC ARIA SESSION SUMMARY
Course Elapsed Days: 8
Plan Fractions Treated to Date: 7
Plan Prescribed Dose Per Fraction: 3 Gy
Plan Total Fractions Prescribed: 10
Plan Total Prescribed Dose: 30 Gy
Reference Point Dosage Given to Date: 21 Gy
Reference Point Session Dosage Given: 3 Gy
Session Number: 7

## 2022-02-17 ENCOUNTER — Ambulatory Visit: Payer: Medicare Other

## 2022-02-17 ENCOUNTER — Ambulatory Visit: Payer: Medicare Other | Admitting: Interventional Cardiology

## 2022-02-17 ENCOUNTER — Telehealth: Payer: Self-pay | Admitting: Radiation Oncology

## 2022-02-17 NOTE — Progress Notes (Deleted)
Cardiology Office Note   Date:  02/17/2022   ID:  [name], DOB 11-14-1955, MRN [id number]  PCP:  [name], MD    No chief complaint on file.  CAD  Wt Readings from Last 3 Encounters:  02/02/22 154 lb 14.4 oz (70.3 kg)  01/28/22 156 lb 4.8 oz (70.9 kg)  01/14/22 160 lb (72.6 kg)       History of Present Illness: [name] is a 66 y.o. male   with CAD, s/p anterior STEMI and emergent CABG in 2017.     He has had multiple episodes of chest pain since then and multiple caths in 2018 and a stress test not resulting in PCI.  He admits to having had some anxiety as well.  Medications have been difficult to tolerate for his anxiety at times.    In 2/19, he had a cath and PCI of the RCA due to narrowing of the graft to the distal RCA.    Seen 02/2018 for presyncope not felt to be cardiac in origin.    Patient was in the ER 06/09/2018 with palpitations and heart rate up to 120 but normal when he arrived.  Also had atypical chest pain anxiety was felt to be a component and he requested narcotics for relief. Event monitor 06/13/2018 normal sinus rhythm with occasional sinus bradycardia and sinus tachycardia.  No atrial fibrillation or pathologic arrhythmias.   Patient back in the ER 09/28/2018 for atypical chest pain that was reproducible.  2D echo normal LVEF 55 to 60% with no valvular problems.  CTA no evidence of aortic dissection, mild prominence of the ascending aorta at 3.9 x 3.9 cm.   In 09/2018, Patient had C7-T1 anterior cervical fusion done by [name] with [facility] neurosurgery and spine.     Since the RCA stent, he felt much better.  He developed more chest pain in 03/2019 which led to a cath: Mid LAD lesion is 55% stenosed. Ost LAD to Prox LAD lesion is 50% stenosed. LIMA to LAD is patent. Ost Ramus lesion is 90% stenosed. Ramus lesion is 75% stenosed. Ost Cx lesion is 80% stenosed. Prox Cx to Mid Cx lesion is 50% stenosed. SVG to OM is  patent. Dist RCA lesion is 50% stenosed. RPDA lesion is 40% stenosed. SVG to PDA is patent with proximal disease that is unchanged. Previously placed Ost RCA to Prox RCA drug eluting stent is widely patent. Graft to the diagonal was occluded. There was TIMI 3 flow in the small diagonal with proximal ectasia. The left ventricular systolic function is normal. LV end diastolic pressure is normal. LVEDP 11 mm Hg. The left ventricular ejection fraction is 50-55% by visual estimate. There is no aortic valve stenosis.   Continue medical therapy.     No new PCI was done.    He had COVID in 09/2020. He was not hospitalized.     Went to hospital on 10/24/20 for chest pain.  Negative troponins.  CT showed: "CT scan does not show evidence of pulmonary embolism but does show this large, 1.7 cm mass that is likely a primary bronchogenic carcinoma. "   Hospitalized in 03/2021: "SIRS-likely from dehydration and anemia versus infectious process.  COVID-19 PCR and blood cultures NGTD.  MRSA PCR screen negative.  SIRS resolved except for mild leukopenia.  Sepsis ruled out.   Near syncope/hypotension-likely from dehydration and anemia.  Orthostatic vitals negative.  Transfused 1 unit with appropriate response.  Resolved.  Symptomatic anemia/pancytopenia-likely from chemotherapy.  He is on Xarelto and DAPT but denies melena or hematochezia.  Anemia panel without significant finding.  Transfused 1 unit with appropriate response.  H&H remained stable.   Has some brain mets, small spots.  Had anemia with cancer treatment.   Aspirin was stopped.   bilateral subsegmental PE -Continue starter pack Xarelto.   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 Earlie Server.  Last chemo on 8/22. 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  Troponin negative. -Discontinued aspirin given risk  for GI bleed. -Continue Toprol XL, Ranexa and Plavix   Past Medical History:  Diagnosis Date   Anemia    Anxiety    Arthritis    Basal cell carcinoma (BCC) of forehead    CAD (coronary artery disease)    a. 10/2015 ant STEMI >> LHC with 3 v CAD; oLAD tx with POBA >> emergent CABG. b. Multiple evals since that time, early graft failure of SVG-RCA by cath 03/2016. c. 2/19 PCI/DES x1 to pRCA, normal EF.   Carotid artery disease ([place])    a. 40-59% BICA 02/2018.   Depression    Dyspnea    Ectopic atrial tachycardia (HCC)    Esophageal reflux    eosinophil esophagitis   Family history of adverse reaction to anesthesia    "sister has PONV" (06/21/2017)   Former tobacco use    Gout    Hepatitis C    "treated and cured" (06/21/2017)   High cholesterol    History of blood transfusion    History of kidney stones    Hypertension    Ischemic cardiomyopathy    a. EF 25-30% at intraop TEE 4/17  //  b. Limited Echo 5/17 - EF 45-50%, mild ant HK. c. EF 55-65% by cath 09/2017.   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 HR dropping into 40s in 02/2016 -> BB reduced.   Stroke [facility]) 10/2016   "small one; sometimes my memory/cognitive issues" (06/21/2017)   Symptomatic hypotension    a. 02/2016 ER visit -> meds reduced.   Syncope    Wears dentures    Wears glasses     Past Surgical History:  Procedure Laterality Date   25 GAUGE PARS PLANA VITRECTOMY WITH 20 GAUGE MVR PORT  12/31/2020   Procedure: 25 GAUGE PARS PLANA VITRECTOMY WITH 20 GAUGE MVR PORT;  Surgeon: [name], MD;  Location: [place];  Service: Thoracic;;   ANTERIOR CERVICAL DECOMP/DISCECTOMY FUSION N/A 10/17/2018   Procedure: Anterior Cervical Decompression Fusion - Cervical seven -Thoracic one;  Surgeon: Consuella Lose, MD;  Location: [place];  Service: Neurosurgery;  Laterality: N/A;   BASAL  CELL CARCINOMA EXCISION     "forehead   BIOPSY  07/20/2019   Procedure: BIOPSY;  Surgeon: Carol Ada, MD;  Location: [facility];  Service: Endoscopy;;   BIOPSY OF MEDIASTINAL MASS Right 07/29/2021   Procedure: RIGHT CHEST WALL MASS BIOPSY;  Surgeon: [name], MD;  Location: [place];  Service: Thoracic;  Laterality: Right;   BRONCHIAL BIOPSY  12/24/2020   Procedure: BRONCHIAL BIOPSIES;  Surgeon: Garner Nash, DO;  Location: [place];  Service: Pulmonary;;   BRONCHIAL BRUSHINGS  12/24/2020  Procedure: BRONCHIAL BRUSHINGS;  Surgeon: Garner Nash, DO;  Location: [place] [facility];  Service: Pulmonary;;   BRONCHIAL NEEDLE ASPIRATION BIOPSY  12/24/2020   Procedure: BRONCHIAL NEEDLE ASPIRATION BIOPSIES;  Surgeon: Garner Nash, DO;  Location: [place];  Service: Pulmonary;;   CARDIAC CATHETERIZATION N/A 11/28/2015   Procedure: Left Heart Cath and Coronary Angiography;  Surgeon: [name], MD;  Location: [place] [facility];  Service: Cardiovascular;  Laterality: N/A;   CARDIAC CATHETERIZATION N/A 11/28/2015   Procedure: Coronary Balloon Angioplasty;  Surgeon: [name], MD;  Location: [place] [facility];  Service: Cardiovascular;  Laterality: N/A;  ostial LAD   CARDIAC CATHETERIZATION N/A 11/28/2015   Procedure: Coronary/Graft Angiography;  Surgeon: [name], MD;  Location: [place] [facility];  Service: Cardiovascular;  Laterality: N/A;  coronaries only    CARDIAC CATHETERIZATION N/A 04/21/2016   Procedure: Left Heart Cath and Coronary Angiography;  Surgeon: Wellington Hampshire, MD;  Location: [place] [facility];  Service: Cardiovascular;  Laterality: N/A;   CARDIAC CATHETERIZATION N/A 06/14/2016   Procedure: Left Heart Cath and Cors/Grafts Angiography;  Surgeon: Lorretta Harp, MD;  Location: [place] [facility];  Service: Cardiovascular;  Laterality: N/A;   CARDIAC CATHETERIZATION N/A 09/08/2016   Procedure: Left Heart Cath and Cors/Grafts  Angiography;  Surgeon: Wellington Hampshire, MD;  Location: [place] [facility];  Service: Cardiovascular;  Laterality: N/A;   CARDIAC CATHETERIZATION     CORONARY ARTERY BYPASS GRAFT N/A 11/28/2015   Procedure: CORONARY ARTERY BYPASS GRAFTING (CABG) TIMES FIVE USING LEFT INTERNAL MAMMARY ARTERY AND RIGHT GREATER SAPHENOUS,VIEN HARVEATED BY ENDOVIEN, INTRAOPPRATIVE TEE;  Surgeon: Gaye Pollack, MD;  Location: [place];  Service: Open Heart Surgery;  Laterality: N/A;   CORONARY STENT INTERVENTION N/A 10/05/2017   Procedure: CORONARY STENT INTERVENTION;  Surgeon: [name], MD;  Location: [place] [facility];  Service: Cardiovascular;  Laterality: N/A;   ESOPHAGOGASTRODUODENOSCOPY (EGD) WITH PROPOFOL N/A 07/20/2019   Procedure: ESOPHAGOGASTRODUODENOSCOPY (EGD) WITH PROPOFOL;  Surgeon: Carol Ada, MD;  [facility];  Service: Endoscopy;  Laterality: N/A;   ESOPHAGOGASTRODUODENOSCOPY (EGD) WITH PROPOFOL N/A 01/30/2021   Procedure: ESOPHAGOGASTRODUODENOSCOPY (EGD) WITH PROPOFOL;  Surgeon: Carol Ada, MD;  [facility];  Service: Endoscopy;  Laterality: N/A;   FIDUCIAL MARKER PLACEMENT  12/24/2020   Procedure: FIDUCIAL DYE MARKER PLACEMENT;  Surgeon: Garner Nash, DO;  Location: [place] [facility];  Service: Pulmonary;;   HUMERUS SURGERY Right 1969   "tumor inside bone; filled it w/bone chips"   INTERCOSTAL NERVE BLOCK Right 12/24/2020   Procedure: INTERCOSTAL NERVE BLOCK;  Surgeon: [name], MD;  Location: [place];  Service: Thoracic;  Laterality: Right;   IR IMAGING GUIDED PORT INSERTION  02/06/2021   LEFT HEART CATH AND CORS/GRAFTS ANGIOGRAPHY N/A 03/11/2017   Procedure: Left Heart Cath and Cors/Grafts Angiography;  Surgeon: Leonie Man, MD;  Location: [place] [facility];  Service: Cardiovascular;  Laterality: N/A;   LEFT HEART CATH AND CORS/GRAFTS ANGIOGRAPHY N/A 10/05/2017   Procedure: LEFT HEART CATH AND CORS/GRAFTS ANGIOGRAPHY;  Surgeon: [name], MD;   Location: [place] [facility];  Service: Cardiovascular;  Laterality: N/A;   LEFT HEART CATH AND CORS/GRAFTS ANGIOGRAPHY N/A 04/11/2019   Procedure: LEFT HEART CATH AND CORS/GRAFTS ANGIOGRAPHY;  Surgeon: [name], MD;  Location: [place] [facility];  Service: Cardiovascular;  Laterality: N/A;   NODE DISSECTION Right 12/24/2020   Procedure: NODE DISSECTION;  Surgeon: [name], MD;  Location: [facility];  Service: Thoracic;  Laterality: Right;   PERIPHERAL VASCULAR CATHETERIZATION N/A 06/14/2016   Procedure: Lower Extremity Angiography;  Surgeon: Lorretta Harp, MD;  Location: [place] [facility];  Service: Cardiovascular;  Laterality: N/A;   VIDEO BRONCHOSCOPY WITH ENDOBRONCHIAL NAVIGATION Right 12/24/2020   Procedure: VIDEO BRONCHOSCOPY WITH ENDOBRONCHIAL NAVIGATION;  Surgeon: Garner Nash, DO;  Location: [place];  Service: Pulmonary;  Laterality: Right;   VIDEO BRONCHOSCOPY WITH ENDOBRONCHIAL ULTRASOUND N/A 12/24/2020   Procedure: VIDEO BRONCHOSCOPY WITH ENDOBRONCHIAL ULTRASOUND;  Surgeon: Garner Nash, DO;  Location: [place];  Service: Pulmonary;  Laterality: N/A;   VIDEO BRONCHOSCOPY WITH INSERTION OF INTERBRONCHIAL VALVE (IBV) N/A 12/31/2020   Procedure: VIDEO BRONCHOSCOPY WITH INSERTION OF INTERBRONCHIAL VALVE (IBV).VALVE IN CARTRIDGE 71mm,9mm. CHEST TUBE PLACEMENT.;  Surgeon: [name], MD;  [facility];  Service: Thoracic;  Laterality: N/A;   VIDEO BRONCHOSCOPY WITH INSERTION OF INTERBRONCHIAL VALVE (IBV) N/A 07/29/2021   Procedure: VIDEO BRONCHOSCOPY WITH REMOVAL OF INTERBRONCHIAL VALVE (IBV);  Surgeon: [name], MD;  [facility];  Service: Thoracic;  Laterality: N/A;     Current Outpatient Medications  Medication Sig Dispense Refill   acetaminophen (TYLENOL) 500 MG tablet Take 1,000 mg by mouth every 6 (six) hours as needed for mild pain, fever or headache.     aspirin EC 81 MG tablet Take 81 mg by mouth daily. Swallow whole.      benzonatate (TESSALON) 100 MG capsule Take 1 capsule (100 mg total) by mouth 3 (three) times daily as needed for cough. 30 capsule 0   diclofenac (VOLTAREN) 25 MG EC tablet Take by mouth.     DULoxetine (CYMBALTA) 30 MG capsule Take 1 capsule (30 mg total) by mouth 2 (two) times daily. 60 capsule 1   folic acid (FOLVITE) 1 MG tablet Take 1 tablet (1 mg total) by mouth daily. 30 tablet 4   HYDROmorphone (DILAUDID) 8 MG tablet Take 1 tablet (8 mg total) by mouth every 6 (six) hours as needed for severe pain. 60 tablet 0   lidocaine-prilocaine (EMLA) cream Apply to the Port-A-Cath site 30-60-minute before treatment 30 g 0   LORazepam (ATIVAN) 1 MG tablet Take 1 tablet (1 mg total) by mouth every 8 (eight) hours as needed for anxiety. 40 tablet 0   magnesium oxide (MAG-OX) 400 (240 Mg) MG tablet Take 1 tablet by mouth daily.     metoprolol succinate (TOPROL-XL) 25 MG 24 hr tablet Take 1 tablet (25 mg total) by mouth daily. 90 tablet 3   morphine (MS CONTIN) 60 MG 12 hr tablet Take 1 tablet by mouth every 12 hours. (01/29/22) 60 tablet 0   ondansetron (ZOFRAN) 8 MG tablet Take 1 tablet by mouth every 8 hours as needed for nausea or vomiting. Starting 3 days after chemotherapy 90 tablet 2   polyethylene glycol (MIRALAX / GLYCOLAX) 17 g packet Take 17 g by mouth daily. (Patient not taking: Reported on 11/27/2021) 14 each 0   prochlorperazine (COMPAZINE) 10 MG tablet Take 1 tablet (10 mg total) by mouth every 6 (six) hours as needed. 30 tablet 2   senna-docusate (SENOKOT-S) 8.6-50 MG tablet Take 1 tablet by mouth 2 (two) times daily. (Patient not taking: Reported on 12/21/2021)     tiZANidine (ZANAFLEX) 2 MG tablet Take 1 tablet (2 mg total) by mouth at bedtime as needed for muscle spasms. 30 tablet 2   XARELTO 20 MG TABS tablet Take 20 mg by mouth every evening.  No current facility-administered medications for this visit.    Allergies:   Prednisone, Tetanus toxoids, Wellbutrin [bupropion], and  Varenicline    Social History:  The patient  reports that he quit smoking about 6 years ago. His smoking use included cigarettes. He has a 33.00 pack-year smoking history. He has never used smokeless tobacco. He reports that he does not currently use alcohol. He reports that he does not use drugs.   Family History:  The patient's ***family history includes Heart Problems in his father; Heart attack in his maternal grandmother and paternal uncle; Heart attack (age of onset: 60) in his father; Heart failure in his father; Hypertension in his brother; Lung cancer in his mother; Stroke in his father and maternal grandmother.    ROS:  Please see the history of present illness.   Otherwise, review of systems are positive for ***.   All other systems are reviewed and negative.    PHYSICAL EXAM: VS:  There were no vitals taken for this visit. , BMI There is no height or weight on file to calculate BMI. GEN: Well nourished, well developed, in no acute distress HEENT: normal Neck: no JVD, carotid bruits, or masses Cardiac: ***RRR; no murmurs, rubs, or gallops,no edema  Respiratory:  clear to auscultation bilaterally, normal work of breathing GI: soft, nontender, nondistended, + BS MS: no deformity or atrophy Skin: warm and dry, no rash Neuro:  Strength and sensation are intact Psych: euthymic mood, full affect   EKG:   The ekg ordered today demonstrates ***   Recent Labs: 06/06/2021: B Natriuretic Peptide 83.3 01/14/2022: Magnesium 1.9 02/02/2022: ALT 15; BUN 17; Creatinine 0.88; Hemoglobin 11.0; Platelet Count 297; Potassium 4.0; Sodium 135; TSH 0.662   Lipid Panel    Component Value Date/Time   CHOL 82 12/27/2020 0125   CHOL 114 11/08/2017 0833   TRIG 97 12/27/2020 0125   HDL 35 (L) 12/27/2020 0125   HDL 52 11/08/2017 0833   CHOLHDL 2.3 12/27/2020 0125   VLDL 19 12/27/2020 0125   LDLCALC 28 12/27/2020 0125   LDLCALC 43 11/08/2017 0833     Other studies Reviewed: Additional  studies/ records that were reviewed today with results demonstrating: ***.   ASSESSMENT AND PLAN:  CAD/Old MI:  PAF: PE: HTN: Anemia:   Current medicines are reviewed at length with the patient today.  The patient concerns regarding his medicines were addressed.  The following changes have been made:  No change***  Labs/ tests ordered today include: *** No orders of the defined types were placed in this encounter.   Recommend 150 minutes/week of aerobic exercise Low fat, low carb, high fiber diet recommended  Disposition:   FU in ***   Signed, Larae Grooms, MD  02/17/2022 8:21 AM    [facility] [address], [place], [place]  [address] Phone: [phone]; Fax: [phone]

## 2022-02-17 NOTE — Telephone Encounter (Signed)
Patient called to cancel [place] scheduled 6/21 due to not feeling well. L4 notified.

## 2022-02-18 ENCOUNTER — Other Ambulatory Visit: Payer: Self-pay | Admitting: Physician Assistant

## 2022-02-18 ENCOUNTER — Encounter: Payer: Self-pay | Admitting: *Deleted

## 2022-02-18 ENCOUNTER — Encounter: Payer: Self-pay | Admitting: Internal Medicine

## 2022-02-18 ENCOUNTER — Other Ambulatory Visit: Payer: Self-pay

## 2022-02-18 ENCOUNTER — Other Ambulatory Visit (HOSPITAL_COMMUNITY): Payer: Self-pay

## 2022-02-18 ENCOUNTER — Encounter: Payer: Self-pay | Admitting: Physician Assistant

## 2022-02-18 ENCOUNTER — Ambulatory Visit
Admission: RE | Admit: 2022-02-18 | Discharge: 2022-02-18 | Disposition: A | Discharge status: Home / Self Care | Payer: Medicare Other | Source: Ambulatory Visit | Attending: Radiation Oncology | Admitting: Radiation Oncology

## 2022-02-18 DIAGNOSIS — R059 Cough, unspecified: Secondary | ICD-10-CM

## 2022-02-18 DIAGNOSIS — C3491 Malignant neoplasm of unspecified part of right bronchus or lung: Secondary | ICD-10-CM | POA: Diagnosis not present

## 2022-02-18 LAB — RAD ONC ARIA SESSION SUMMARY
Course Elapsed Days: 10
Plan Fractions Treated to Date: 8
Plan Prescribed Dose Per Fraction: 3 Gy
Plan Total Fractions Prescribed: 10
Plan Total Prescribed Dose: 30 Gy
Reference Point Dosage Given to Date: 24 Gy
Reference Point Session Dosage Given: 3 Gy
Session Number: 8

## 2022-02-18 MED ORDER — BENZONATATE 100 MG PO CAPS
100.0000 mg | ORAL_CAPSULE | Freq: Three times a day (TID) | ORAL | 0 refills | Status: DC | PRN
  Filled 2022-02-18: qty 30, 10d supply, fill #0

## 2022-02-18 NOTE — Progress Notes (Signed)
I followed up on [name] treatment plan and noted he is scheduled at this time.

## 2022-02-18 NOTE — Telephone Encounter (Signed)
He has had issues with anemia.  THis can make his HR fast, particularly with exertion.

## 2022-02-19 ENCOUNTER — Ambulatory Visit
Admission: RE | Admit: 2022-02-19 | Discharge: 2022-02-19 | Disposition: A | Discharge status: Home / Self Care | Payer: Medicare Other | Source: Ambulatory Visit | Attending: Radiation Oncology | Admitting: Radiation Oncology

## 2022-02-19 ENCOUNTER — Other Ambulatory Visit: Payer: Self-pay

## 2022-02-19 ENCOUNTER — Telehealth: Payer: Self-pay

## 2022-02-19 ENCOUNTER — Ambulatory Visit: Payer: Medicare Other

## 2022-02-19 ENCOUNTER — Other Ambulatory Visit: Payer: Self-pay | Admitting: Radiation Oncology

## 2022-02-19 DIAGNOSIS — C3491 Malignant neoplasm of unspecified part of right bronchus or lung: Secondary | ICD-10-CM | POA: Diagnosis not present

## 2022-02-19 LAB — RAD ONC ARIA SESSION SUMMARY
Course Elapsed Days: 11
Plan Fractions Treated to Date: 9
Plan Prescribed Dose Per Fraction: 3 Gy
Plan Total Fractions Prescribed: 10
Plan Total Prescribed Dose: 30 Gy
Reference Point Dosage Given to Date: 27 Gy
Reference Point Session Dosage Given: 3 Gy
Session Number: 9

## 2022-02-19 MED ORDER — DEXAMETHASONE 4 MG PO TABS: 2.0000 mg | ORAL_TABLET | Freq: Every day | ORAL | 0 refills | Status: DC

## 2022-02-19 NOTE — Telephone Encounter (Signed)
Called and informed patient a rx was being sent in for tessalon perle for his cough. Pt was appreciative and had no questions or concerns.

## 2022-02-22 ENCOUNTER — Ambulatory Visit
Admission: RE | Admit: 2022-02-22 | Discharge: 2022-02-22 | Disposition: A | Discharge status: Home / Self Care | Payer: Medicare Other | Source: Ambulatory Visit | Attending: Radiation Oncology | Admitting: Radiation Oncology

## 2022-02-22 ENCOUNTER — Other Ambulatory Visit: Payer: Self-pay

## 2022-02-22 ENCOUNTER — Other Ambulatory Visit: Payer: Self-pay | Admitting: Nurse Practitioner

## 2022-02-22 ENCOUNTER — Telehealth: Payer: Self-pay | Admitting: Radiation Oncology

## 2022-02-22 ENCOUNTER — Encounter: Payer: Self-pay | Admitting: Radiation Oncology

## 2022-02-22 ENCOUNTER — Telehealth: Payer: Self-pay

## 2022-02-22 ENCOUNTER — Other Ambulatory Visit (HOSPITAL_COMMUNITY): Payer: Self-pay

## 2022-02-22 VITALS — BP 106/68 | HR 123 | Temp 101.3°F | Resp 18

## 2022-02-22 DIAGNOSIS — R5382 Chronic fatigue, unspecified: Secondary | ICD-10-CM

## 2022-02-22 DIAGNOSIS — C3491 Malignant neoplasm of unspecified part of right bronchus or lung: Secondary | ICD-10-CM | POA: Diagnosis not present

## 2022-02-22 DIAGNOSIS — C7931 Secondary malignant neoplasm of brain: Secondary | ICD-10-CM

## 2022-02-22 LAB — CMP (CANCER CENTER ONLY)
ALT: 19 U/L (ref 0–44)
AST: 27 U/L (ref 15–41)
Albumin: 3.7 g/dL (ref 3.5–5.0)
Alkaline Phosphatase: 107 U/L (ref 38–126)
Anion gap: 7 (ref 5–15)
BUN: 18 mg/dL (ref 8–23)
CO2: 29 mmol/L (ref 22–32)
Calcium: 9.5 mg/dL (ref 8.9–10.3)
Chloride: 98 mmol/L (ref 98–111)
Creatinine: 0.94 mg/dL (ref 0.61–1.24)
GFR, Estimated: >60 mL/min (ref >60)
Glucose, Bld: 96 mg/dL (ref 70–99)
Potassium: 4 mmol/L (ref 3.5–5.1)
Sodium: 134 mmol/L — ABNORMAL LOW (ref 135–145)
Total Bilirubin: 0.8 mg/dL (ref 0.3–1.2)
Total Protein: 7.6 g/dL (ref 6.5–8.1)

## 2022-02-22 LAB — CBC WITH DIFFERENTIAL (CANCER CENTER ONLY)
Abs Immature Granulocytes: 0.04 10*3/uL (ref 0.00–0.07)
Basophils Absolute: 0 10*3/uL (ref 0.0–0.1)
Basophils Relative: 0 %
Eosinophils Absolute: 0 10*3/uL (ref 0.0–0.5)
Eosinophils Relative: 0 %
HCT: 30.7 % — ABNORMAL LOW (ref 39.0–52.0)
Hemoglobin: 10.1 g/dL — ABNORMAL LOW (ref 13.0–17.0)
Immature Granulocytes: 1 %
Lymphocytes Relative: 4 %
Lymphs Abs: 0.2 10*3/uL — ABNORMAL LOW (ref 0.7–4.0)
MCH: 32.9 pg (ref 26.0–34.0)
MCHC: 32.9 g/dL (ref 30.0–36.0)
MCV: 100 fL (ref 80.0–100.0)
Monocytes Absolute: 0.2 10*3/uL (ref 0.1–1.0)
Monocytes Relative: 3 %
Neutro Abs: 4.8 10*3/uL (ref 1.7–7.7)
Neutrophils Relative %: 92 %
Platelet Count: 223 10*3/uL (ref 150–400)
RBC: 3.07 MIL/uL — ABNORMAL LOW (ref 4.22–5.81)
RDW: 15.1 % (ref 11.5–15.5)
WBC Count: 5.3 10*3/uL (ref 4.0–10.5)
nRBC: 0 % (ref 0.0–0.2)

## 2022-02-22 LAB — RAD ONC ARIA SESSION SUMMARY
Course Elapsed Days: 14
Plan Fractions Treated to Date: 10
Plan Prescribed Dose Per Fraction: 3 Gy
Plan Total Fractions Prescribed: 10
Plan Total Prescribed Dose: 30 Gy
Reference Point Dosage Given to Date: 30 Gy
Reference Point Session Dosage Given: 3 Gy
Session Number: 10

## 2022-02-22 MED ORDER — MORPHINE SULFATE ER 60 MG PO TBCR
60.0000 mg | EXTENDED_RELEASE_TABLET | Freq: Two times a day (BID) | ORAL | 0 refills | Status: DC
  Filled 2022-02-22: qty 60, 30d supply, fill #0

## 2022-02-22 MED ORDER — HYDROMORPHONE HCL 8 MG PO TABS
8.0000 mg | ORAL_TABLET | Freq: Four times a day (QID) | ORAL | 0 refills | Status: DC | PRN
Start: 2022-02-22 — End: 2022-03-11
  Filled 2022-02-22: qty 60, 15d supply, fill #0

## 2022-02-23 ENCOUNTER — Encounter: Payer: Self-pay | Admitting: Physician Assistant

## 2022-02-23 LAB — TSH: TSH: 0.45 u[IU]/mL (ref 0.350–4.500)

## 2022-02-25 ENCOUNTER — Inpatient Hospital Stay: Payer: Medicare Other | Admitting: Nutrition

## 2022-02-25 ENCOUNTER — Inpatient Hospital Stay: Payer: Medicare Other

## 2022-02-25 ENCOUNTER — Other Ambulatory Visit: Payer: Self-pay

## 2022-02-25 ENCOUNTER — Inpatient Hospital Stay (HOSPITAL_BASED_OUTPATIENT_CLINIC_OR_DEPARTMENT_OTHER): Payer: Medicare Other | Admitting: Nurse Practitioner

## 2022-02-25 ENCOUNTER — Encounter: Payer: Self-pay | Admitting: Nurse Practitioner

## 2022-02-25 ENCOUNTER — Inpatient Hospital Stay (HOSPITAL_BASED_OUTPATIENT_CLINIC_OR_DEPARTMENT_OTHER): Payer: Medicare Other | Admitting: Internal Medicine

## 2022-02-25 VITALS — BP 103/63 | HR 90 | Temp 98.3°F | Resp 16 | Ht 69.0 in | Wt 153.7 lb

## 2022-02-25 DIAGNOSIS — Z5111 Encounter for antineoplastic chemotherapy: Secondary | ICD-10-CM

## 2022-02-25 DIAGNOSIS — F419 Anxiety disorder, unspecified: Secondary | ICD-10-CM

## 2022-02-25 DIAGNOSIS — G893 Neoplasm related pain (acute) (chronic): Secondary | ICD-10-CM | POA: Diagnosis not present

## 2022-02-25 DIAGNOSIS — C3491 Malignant neoplasm of unspecified part of right bronchus or lung: Secondary | ICD-10-CM

## 2022-02-25 DIAGNOSIS — C3490 Malignant neoplasm of unspecified part of unspecified bronchus or lung: Secondary | ICD-10-CM | POA: Diagnosis not present

## 2022-02-25 DIAGNOSIS — Z515 Encounter for palliative care: Secondary | ICD-10-CM | POA: Diagnosis not present

## 2022-02-25 DIAGNOSIS — C7A1 Malignant poorly differentiated neuroendocrine tumors: Secondary | ICD-10-CM | POA: Diagnosis not present

## 2022-02-25 DIAGNOSIS — R5382 Chronic fatigue, unspecified: Secondary | ICD-10-CM

## 2022-02-25 DIAGNOSIS — Z5112 Encounter for antineoplastic immunotherapy: Secondary | ICD-10-CM | POA: Diagnosis not present

## 2022-02-25 DIAGNOSIS — Z95828 Presence of other vascular implants and grafts: Secondary | ICD-10-CM

## 2022-02-25 LAB — CBC WITH DIFFERENTIAL (CANCER CENTER ONLY)
Abs Immature Granulocytes: 0.05 10*3/uL (ref 0.00–0.07)
Basophils Absolute: 0 10*3/uL (ref 0.0–0.1)
Basophils Relative: 0 %
Eosinophils Absolute: 0 10*3/uL (ref 0.0–0.5)
Eosinophils Relative: 0 %
HCT: 25.9 % — ABNORMAL LOW (ref 39.0–52.0)
Hemoglobin: 8.6 g/dL — ABNORMAL LOW (ref 13.0–17.0)
Immature Granulocytes: 1 %
Lymphocytes Relative: 6 %
Lymphs Abs: 0.6 10*3/uL — ABNORMAL LOW (ref 0.7–4.0)
MCH: 33 pg (ref 26.0–34.0)
MCHC: 33.2 g/dL (ref 30.0–36.0)
MCV: 99.2 fL (ref 80.0–100.0)
Monocytes Absolute: 0.7 10*3/uL (ref 0.1–1.0)
Monocytes Relative: 8 %
Neutro Abs: 7.6 10*3/uL (ref 1.7–7.7)
Neutrophils Relative %: 85 %
Platelet Count: 241 10*3/uL (ref 150–400)
RBC: 2.61 MIL/uL — ABNORMAL LOW (ref 4.22–5.81)
RDW: 14.8 % (ref 11.5–15.5)
WBC Count: 8.9 10*3/uL (ref 4.0–10.5)
nRBC: 0 % (ref 0.0–0.2)

## 2022-02-25 LAB — CMP (CANCER CENTER ONLY)
ALT: 25 U/L (ref 0–44)
AST: 27 U/L (ref 15–41)
Albumin: 3.6 g/dL (ref 3.5–5.0)
Alkaline Phosphatase: 91 U/L (ref 38–126)
Anion gap: 7 (ref 5–15)
BUN: 20 mg/dL (ref 8–23)
CO2: 28 mmol/L (ref 22–32)
Calcium: 9.8 mg/dL (ref 8.9–10.3)
Chloride: 101 mmol/L (ref 98–111)
Creatinine: 0.97 mg/dL (ref 0.61–1.24)
GFR, Estimated: >60 mL/min (ref >60)
Glucose, Bld: 93 mg/dL (ref 70–99)
Potassium: 4.2 mmol/L (ref 3.5–5.1)
Sodium: 136 mmol/L (ref 135–145)
Total Bilirubin: 0.5 mg/dL (ref 0.3–1.2)
Total Protein: 7.1 g/dL (ref 6.5–8.1)

## 2022-02-25 MED ORDER — SODIUM CHLORIDE 0.9% FLUSH
10.0000 mL | Freq: Once | INTRAVENOUS | Status: AC
  Administered 2022-02-25: 10 mL

## 2022-02-25 MED ORDER — SODIUM CHLORIDE 0.9% FLUSH
10.0000 mL | INTRAVENOUS | Status: DC | PRN
  Administered 2022-02-25: 10 mL

## 2022-02-25 MED ORDER — SODIUM CHLORIDE 0.9 % IV SOLN: Freq: Once | INTRAVENOUS | Status: AC

## 2022-02-25 MED ORDER — LORAZEPAM 1 MG PO TABS: 1.0000 mg | ORAL_TABLET | Freq: Three times a day (TID) | ORAL | 0 refills | Status: DC | PRN

## 2022-02-25 MED ORDER — PROCHLORPERAZINE MALEATE 10 MG PO TABS
10.0000 mg | ORAL_TABLET | Freq: Once | ORAL | Status: AC
  Administered 2022-02-25: 10 mg via ORAL
  Filled 2022-02-25: qty 1

## 2022-02-25 MED ORDER — SODIUM CHLORIDE 0.9 % IV SOLN
500.0000 mg/m2 | Freq: Once | INTRAVENOUS | Status: AC
  Administered 2022-02-25: 1000 mg via INTRAVENOUS
  Filled 2022-02-25: qty 40

## 2022-02-25 MED ORDER — HEPARIN SOD (PORK) LOCK FLUSH 100 UNIT/ML IV SOLN
500.0000 [IU] | Freq: Once | INTRAVENOUS | Status: AC | PRN
  Administered 2022-02-25: 500 [IU]

## 2022-02-25 MED ORDER — SODIUM CHLORIDE 0.9 % IV SOLN
200.0000 mg | Freq: Once | INTRAVENOUS | Status: AC
  Administered 2022-02-25: 200 mg via INTRAVENOUS
  Filled 2022-02-25: qty 200

## 2022-02-25 NOTE — Progress Notes (Signed)
[place] [phone]   [phone]  OFFICE PROGRESS NOTE  [name], MD [place] [address]  DIAGNOSIS: Stage IV (pT1c, N1, M1b) large cell neuroendocrine carcinoma.  He presented with a right upper lobe lung nodule and hilar lymphadenopathy.  He was diagnosed in April 2022. He also had a small metastatic brain lesion.  The patient has local recurrence of his disease and the right chest wall diagnosed in November 2022. Molecular studies by foundation 1 showed no actionable mutations   PDL1: 0%   PRIOR THERAPY: 1) Robotic assisted thoracic surgery with a right upper lobectomy which was performed on 12/24/2020.  2) SRS to a small metastatic brain lesion completed on February 13, 2021 under the care of Dr. Lisbeth Renshaw.   3) Adjuvant chemotherapy with cisplatin 80 mg/m2 on day 1, etoposide 100 mg per metered squared on days 1, 2, and 3, IV every 3 weeks. First dose on 02/09/21.  Status post 4 cycles. 4) SRS to 4 new brain metastasis under the care of Dr. Lisbeth Renshaw, Last dose on 08/26/21 5) Palliative radiation to right chest wall from 09/01/21-09/23/21 under the care of Dr. Lisbeth Renshaw 6) SRS to the new brain lesion along the thalamus under the care of Dr. Lisbeth Renshaw. Completed on 10/20/21   CURRENT THERAPY:  1) palliative systemic chemotherapy with carboplatin for an AUC of 5, Alimta 500 mg per metered square, Keytruda 200 mg IV every 3 weeks.  First dose on 09/29/21.  Carboplatin has been removed from cycle #2 due to pancytopenia.  Status post 6 cycles 2) SRS to the new brain lesion along the thalamus under the care of Dr. Lisbeth Renshaw. Completed on 10/20/21  INTERVAL HISTORY: [name] 66 y.o. male returns to the clinic today for follow-up visit accompanied by his wife.  The patient continues to complain of increasing fatigue and weakness.  He always have some concern about his chemotherapy and tolerance and it takes him longer time to recover.  I  discussed with him in the past discontinuing Alimta and continuing with single agent Keytruda but the patient declined that option and wanted to continue with the combination.  He has no current chest pain but has shortness of breath with exertion with mild cough and no hemoptysis.  He has no current nausea, vomiting, diarrhea or constipation.  There was found recently to have multiple new metastatic brain lesions and he completed a course of whole brain irradiation under the care of Dr. Lisbeth Renshaw.  He has a lot of scales and desquamation of the scalp after the radiotherapy.  He is here today for evaluation before starting cycle #8 of his treatment.  MEDICAL HISTORY: Past Medical History:  Diagnosis Date   Anemia    Anxiety    Arthritis    Basal cell carcinoma (BCC) of forehead    CAD (coronary artery disease)    a. 10/2015 ant STEMI >> LHC with 3 v CAD; oLAD tx with POBA >> emergent CABG. b. Multiple evals since that time, early graft failure of SVG-RCA by cath 03/2016. c. 2/19 PCI/DES x1 to pRCA, normal EF.   Carotid artery disease ([facility])    a. 40-59% BICA 02/2018.   Depression    Dyspnea    Ectopic atrial tachycardia (HCC)    Esophageal reflux    eosinophil esophagitis   Family history of adverse reaction to anesthesia    "sister has PONV" (06/21/2017)   Former tobacco use  Gout    Hepatitis C    "treated and cured" (06/21/2017)   High cholesterol    History of blood transfusion    History of kidney stones    Hypertension    Ischemic cardiomyopathy    a. EF 25-30% at intraop TEE 4/17  //  b. Limited Echo 5/17 - EF 45-50%, mild ant HK. c. EF 55-65% by cath 09/2017.   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[facility])    Migraine    "3-4/yr" (06/21/2017)   Myocardial infarction ([facility]) 10/2015   Palpitations    Pneumonia    Sinus bradycardia    a. HR dropping into 40s in 02/2016 -> BB reduced.   Stroke [facility]) 10/2016   "small one; sometimes my memory/cognitive issues" (06/21/2017)   Symptomatic  hypotension    a. 02/2016 ER visit -> meds reduced.   Syncope    Wears dentures    Wears glasses     ALLERGIES:  is allergic to prednisone, tetanus toxoids, wellbutrin [bupropion], and varenicline.  MEDICATIONS:  Current Outpatient Medications  Medication Sig Dispense Refill   acetaminophen (TYLENOL) 500 MG tablet Take 1,000 mg by mouth every 6 (six) hours as needed for mild pain, fever or headache.     aspirin EC 81 MG tablet Take 81 mg by mouth daily. Swallow whole.     benzonatate (TESSALON) 100 MG capsule TAKE 1 CAPSULE(100 MG) BY MOUTH THREE TIMES DAILY AS NEEDED FOR COUGH 30 capsule 0   dexamethasone (DECADRON) 4 MG tablet Take 0.5 tablets (2 mg total) by mouth daily. Take 1 tablet x1, then 1/2 tablet daily 20 tablet 0   diclofenac (VOLTAREN) 25 MG EC tablet Take by mouth.     DULoxetine (CYMBALTA) 30 MG capsule Take 1 capsule (30 mg total) by mouth 2 (two) times daily. 60 capsule 1   folic acid (FOLVITE) 1 MG tablet Take 1 tablet (1 mg total) by mouth daily. 30 tablet 4   HYDROmorphone (DILAUDID) 8 MG tablet Take 1 tablet by mouth every 6 hours as needed for severe pain. 60 tablet 0   lidocaine-prilocaine (EMLA) cream Apply to the Port-A-Cath site 30-60-minute before treatment 30 g 0   LORazepam (ATIVAN) 1 MG tablet Take 1 tablet (1 mg total) by mouth every 8 (eight) hours as needed for anxiety. 40 tablet 0   magnesium oxide (MAG-OX) 400 (240 Mg) MG tablet Take 1 tablet by mouth daily.     metoprolol succinate (TOPROL-XL) 25 MG 24 hr tablet Take 1 tablet (25 mg total) by mouth daily. 90 tablet 3   morphine (MS CONTIN) 60 MG 12 hr tablet Take 1 tablet by mouth every 12  hours. 60 tablet 0   ondansetron (ZOFRAN) 8 MG tablet Take 1 tablet by mouth every 8 hours as needed for nausea or vomiting. Starting 3 days after chemotherapy 90 tablet 2   polyethylene glycol (MIRALAX / GLYCOLAX) 17 g packet Take 17 g by mouth daily. (Patient not taking: Reported on 11/27/2021) 14 each 0    prochlorperazine (COMPAZINE) 10 MG tablet Take 1 tablet (10 mg total) by mouth every 6 (six) hours as needed. 30 tablet 2   senna-docusate (SENOKOT-S) 8.6-50 MG tablet Take 1 tablet by mouth 2 (two) times daily. (Patient not taking: Reported on 12/21/2021)     tiZANidine (ZANAFLEX) 2 MG tablet Take 1 tablet (2 mg total) by mouth at bedtime as needed for muscle spasms. 30 tablet 2   XARELTO 20 MG TABS tablet Take 20 mg by mouth  every evening.     No current facility-administered medications for this visit.    SURGICAL HISTORY:  Past Surgical History:  Procedure Laterality Date   25 GAUGE PARS PLANA VITRECTOMY WITH 20 GAUGE MVR PORT  12/31/2020   Procedure: 25 GAUGE PARS PLANA VITRECTOMY WITH 20 GAUGE MVR PORT;  Surgeon: [name], MD;  [facility]: [facility];  Service: Thoracic;;   ANTERIOR CERVICAL DECOMP/DISCECTOMY FUSION N/A 10/17/2018   Procedure: Anterior Cervical Decompression Fusion - Cervical seven -Thoracic one;  Surgeon: Consuella Lose, MD;  Location: [facility];  Service: Neurosurgery;  Laterality: N/A;   BASAL CELL CARCINOMA EXCISION     "forehead   BIOPSY  07/20/2019   Procedure: BIOPSY;  Surgeon: Carol Ada, MD;  Location: [facility];  Service: Endoscopy;;   BIOPSY OF MEDIASTINAL MASS Right 07/29/2021   Procedure: RIGHT CHEST WALL MASS BIOPSY;  Surgeon: [name], MD;  Location: [place];  Service: Thoracic;  Laterality: Right;   BRONCHIAL BIOPSY  12/24/2020   Procedure: BRONCHIAL BIOPSIES;  Surgeon: Garner Nash, DO;  Location: [facility];  Service: Pulmonary;;   BRONCHIAL BRUSHINGS  12/24/2020   Procedure: BRONCHIAL BRUSHINGS;  Surgeon: Garner Nash, DO;  Location: [place];  Service: Pulmonary;;   BRONCHIAL NEEDLE ASPIRATION BIOPSY  12/24/2020   Procedure: BRONCHIAL NEEDLE ASPIRATION BIOPSIES;  Surgeon: Garner Nash, DO;  Location: [place];  Service: Pulmonary;;   CARDIAC CATHETERIZATION N/A 11/28/2015   Procedure: Left Heart Cath and  Coronary Angiography;  Surgeon: [name], MD;  Location: [facility];  Service: Cardiovascular;  Laterality: N/A;   CARDIAC CATHETERIZATION N/A 11/28/2015   Procedure: Coronary Balloon Angioplasty;  Surgeon: [name], MD;  Location: [place] [facility];  Service: Cardiovascular;  Laterality: N/A;  ostial LAD   CARDIAC CATHETERIZATION N/A 11/28/2015   Procedure: Coronary/Graft Angiography;  Surgeon: [name], MD;  Location: [place] [facility];  Service: Cardiovascular;  Laterality: N/A;  coronaries only    CARDIAC CATHETERIZATION N/A 04/21/2016   Procedure: Left Heart Cath and Coronary Angiography;  Surgeon: Wellington Hampshire, MD;  Location: [place] [facility];  Service: Cardiovascular;  Laterality: N/A;   CARDIAC CATHETERIZATION N/A 06/14/2016   Procedure: Left Heart Cath and Cors/Grafts Angiography;  Surgeon: Lorretta Harp, MD;  Location: [place] [facility];  Service: Cardiovascular;  Laterality: N/A;   CARDIAC CATHETERIZATION N/A 09/08/2016   Procedure: Left Heart Cath and Cors/Grafts Angiography;  Surgeon: Wellington Hampshire, MD;  Location: [place] [facility];  Service: Cardiovascular;  Laterality: N/A;   CARDIAC CATHETERIZATION     CORONARY ARTERY BYPASS GRAFT N/A 11/28/2015   Procedure: CORONARY ARTERY BYPASS GRAFTING (CABG) TIMES FIVE USING LEFT INTERNAL MAMMARY ARTERY AND RIGHT GREATER SAPHENOUS,VIEN HARVEATED BY ENDOVIEN, INTRAOPPRATIVE TEE;  Surgeon: Gaye Pollack, MD;  Location: [place];  Service: Open Heart Surgery;  Laterality: N/A;   CORONARY STENT INTERVENTION N/A 10/05/2017   Procedure: CORONARY STENT INTERVENTION;  Surgeon: [name], MD;  Location: [place] [facility];  Service: Cardiovascular;  Laterality: N/A;   ESOPHAGOGASTRODUODENOSCOPY (EGD) WITH PROPOFOL N/A 07/20/2019   Procedure: ESOPHAGOGASTRODUODENOSCOPY (EGD) WITH PROPOFOL;  Surgeon: Carol Ada, MD;  [facility];  Service: Endoscopy;  Laterality: N/A;    ESOPHAGOGASTRODUODENOSCOPY (EGD) WITH PROPOFOL N/A 01/30/2021   Procedure: ESOPHAGOGASTRODUODENOSCOPY (EGD) WITH PROPOFOL;  Surgeon: Carol Ada, MD;  [facility];  Service: Endoscopy;  Laterality: N/A;   FIDUCIAL MARKER PLACEMENT  12/24/2020   Procedure: FIDUCIAL DYE  MARKER PLACEMENT;  Surgeon: Garner Nash, DO;  Location: [facility];  Service: Pulmonary;;   HUMERUS SURGERY Right 1969   "tumor inside bone; filled it w/bone chips"   INTERCOSTAL NERVE BLOCK Right 12/24/2020   Procedure: INTERCOSTAL NERVE BLOCK;  Surgeon: [name], MD;  Location: [facility];  Service: Thoracic;  Laterality: Right;   IR IMAGING GUIDED PORT INSERTION  02/06/2021   LEFT HEART CATH AND CORS/GRAFTS ANGIOGRAPHY N/A 03/11/2017   Procedure: Left Heart Cath and Cors/Grafts Angiography;  Surgeon: Leonie Man, MD;  Location: [place] [facility];  Service: Cardiovascular;  Laterality: N/A;   LEFT HEART CATH AND CORS/GRAFTS ANGIOGRAPHY N/A 10/05/2017   Procedure: LEFT HEART CATH AND CORS/GRAFTS ANGIOGRAPHY;  Surgeon: [name], MD;  Location: [place] [facility];  Service: Cardiovascular;  Laterality: N/A;   LEFT HEART CATH AND CORS/GRAFTS ANGIOGRAPHY N/A 04/11/2019   Procedure: LEFT HEART CATH AND CORS/GRAFTS ANGIOGRAPHY;  Surgeon: [name], MD;  Location: [place] [facility];  Service: Cardiovascular;  Laterality: N/A;   NODE DISSECTION Right 12/24/2020   Procedure: NODE DISSECTION;  Surgeon: [name], MD;  Location: [place];  Service: Thoracic;  Laterality: Right;   PERIPHERAL VASCULAR CATHETERIZATION N/A 06/14/2016   Procedure: Lower Extremity Angiography;  Surgeon: Lorretta Harp, MD;  Location: [place] [facility];  Service: Cardiovascular;  Laterality: N/A;   VIDEO BRONCHOSCOPY WITH ENDOBRONCHIAL NAVIGATION Right 12/24/2020   Procedure: VIDEO BRONCHOSCOPY WITH ENDOBRONCHIAL NAVIGATION;  Surgeon: Garner Nash, DO;  Location: [facility];  Service: Pulmonary;   Laterality: Right;   VIDEO BRONCHOSCOPY WITH ENDOBRONCHIAL ULTRASOUND N/A 12/24/2020   Procedure: VIDEO BRONCHOSCOPY WITH ENDOBRONCHIAL ULTRASOUND;  Surgeon: Garner Nash, DO;  Location: [place];  Service: Pulmonary;  Laterality: N/A;   VIDEO BRONCHOSCOPY WITH INSERTION OF INTERBRONCHIAL VALVE (IBV) N/A 12/31/2020   Procedure: VIDEO BRONCHOSCOPY WITH INSERTION OF INTERBRONCHIAL VALVE (IBV).VALVE IN CARTRIDGE 27mm,9mm. CHEST TUBE PLACEMENT.;  Surgeon: [name], MD;  [facility];  Service: Thoracic;  Laterality: N/A;   VIDEO BRONCHOSCOPY WITH INSERTION OF INTERBRONCHIAL VALVE (IBV) N/A 07/29/2021   Procedure: VIDEO BRONCHOSCOPY WITH REMOVAL OF INTERBRONCHIAL VALVE (IBV);  Surgeon: [name], MD;  Location[facility];  Service: Thoracic;  Laterality: N/A;    REVIEW OF SYSTEMS:  Constitutional: positive for fatigue and weight loss Eyes: negative Ears, nose, mouth, throat, and face: negative Respiratory: positive for cough and dyspnea on exertion Cardiovascular: negative Gastrointestinal: positive for nausea Genitourinary:negative Integument/breast: negative Hematologic/lymphatic: negative Musculoskeletal:positive for muscle weakness Neurological: negative Behavioral/Psych: negative Endocrine: negative Allergic/Immunologic: negative   PHYSICAL EXAMINATION: General appearance: alert, cooperative, fatigued, and no distress Head: Normocephalic, without obvious abnormality, atraumatic Neck: no adenopathy, no JVD, supple, symmetrical, trachea midline, and thyroid not enlarged, symmetric, no tenderness/mass/nodules Lymph nodes: Cervical, supraclavicular, and axillary nodes normal. Resp: clear to auscultation bilaterally Back: symmetric, no curvature. ROM normal. No CVA tenderness. Cardio: regular rate and rhythm, S1, S2 normal, no murmur, click, rub or gallop GI: soft, non-tender; bowel sounds normal; no masses,  no organomegaly Extremities: extremities normal,  atraumatic, no cyanosis or edema Neurologic: Alert and oriented X 3, normal strength and tone. Normal symmetric reflexes. Normal coordination and gait  ECOG PERFORMANCE STATUS: 1 - Symptomatic but completely ambulatory  Blood pressure 103/63, pulse 90, temperature 98.3 F (36.8 C), temperature source Oral, resp. rate 16, height $RemoveBe'5\' 9"'OBGjCqINB$  (1.753 m), weight 153 lb 11.2 oz (69.7 kg), SpO2 97 %.  LABORATORY DATA: Lab Results  Component Value Date  WBC 8.9 02/25/2022   HGB 8.6 (L) 02/25/2022   HCT 25.9 (L) 02/25/2022   MCV 99.2 02/25/2022   PLT 241 02/25/2022      Chemistry      Component Value Date/Time   NA 134 (L) 02/22/2022 1345   K 4.0 02/22/2022 1345   CL 98 02/22/2022 1345   CO2 29 02/22/2022 1345   BUN 18 02/22/2022 1345   CREATININE 0.94 02/22/2022 1345   CREATININE 1.00 02/12/2016 1038      Component Value Date/Time   CALCIUM 9.5 02/22/2022 1345   ALKPHOS 107 02/22/2022 1345   AST 27 02/22/2022 1345   ALT 19 02/22/2022 1345   BILITOT 0.8 02/22/2022 1345       RADIOGRAPHIC STUDIES: MR Brain W Wo Contrast  Result Date: 01/27/2022 CLINICAL DATA:  66 year old male with metastatic large cell 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 Prior SRS most recently 10/27/2021. Restaging. EXAM: MRI HEAD WITHOUT AND WITH CONTRAST TECHNIQUE: Multiplanar, multiecho pulse sequences of the brain and surrounding structures were obtained without and with intravenous contrast. CONTRAST:  30mL MULTIHANCE GADOBENATE DIMEGLUMINE 529 MG/ML IV SOLN COMPARISON:  Prior brain MRI 10/16/2021 and earlier. FINDINGS: BRAIN New Lesions: Approximately 15 new small enhancing metastases (at least 6 in the cerebellum alone). Many are punctate. There are 2 adjacent lesions in the inferior medial right occipital lobe on series 15, image 58, each 4-5 mm. And questionable additional small new metastasis along the inferior right temporal lobe on series 15, image 53. All lesions annotated  with double arrows on series 15. Larger lesions: The right perirolandic lesion on series 15, image 147 has increased from about 4 mm in February 2 7 mm now. Adjacent T2 and FLAIR hyperintense vasogenic edema has also increased but is mild (series 12, image 46). Left lateral cerebellar enhancing metastasis on series 15, image 45 appears slightly larger and more solidly enhancing. Surrounding edema also appears slightly increased on series 12, image 13. Stable or Smaller lesions: Anterior left thalamic lesion has regressed on series 15, image 102, now punctate. Mild regional edema has resolved. Punctate left parietal lobe lesion seen previously on series 11, image 129 is not evident today. And the left middle frontal gyrus lesion previously seen on series 11, image 146 is not evident. Other Brain findings: No significant intracranial mass effect. No midline shift. No superimposed restricted diffusion to suggest acute infarction. No ventriculomegaly, extra-axial collection or acute intracranial hemorrhage. Cervicomedullary junction and pituitary are within normal limits. Mild hemosiderin associated with treated metastases is stable. No dural thickening is identified. Vascular: Major intracranial vascular flow voids are stable. On series 16 the major dural venous sinuses are enhancing and appear to be patent. Skull and upper cervical spine: Negative visible cervical spinal cord. Visualized bone marrow signal is within normal limits. Sinuses/Orbits: Stable, negative. Other: Visible internal auditory structures appear normal. Visible scalp and face appear negative. IMPRESSION: 1. Progression of disease. 15-16 small new brain metastases since February, many punctate. The largest are 5 mm. All lesions annotated on series 15. 2. Two slightly larger treated metastases: Right perirolandic and left lateral cerebellum. The former has conspicuously increased surrounding edema but there is no significant mass effect. 3. Left  thalamic metastasis has regressed. Electronically Signed   By: Genevie Ann M.D.   On: 01/27/2022 09:22    ASSESSMENT AND PLAN: This is 66 years old white male recently diagnosed with a stage IV (T1c, N1, M1 B) large cell neuroendocrine carcinoma presented with  right upper lobe lung nodule in addition to right hilar adenopathy in April 2022 and the patient was found to have solitary brain metastasis in May 2022 He is status post right upper lobectomy with lymph node sampling under the care of Dr. Kipp Brood. The patient underwent systemic chemotherapy with cisplatin 80 mg/M2 on day 1 and etoposide 100 Mg/M2 on days 1, 2 and 3 every 3 weeks.  He is status post 4 cycles.  He has a rough time tolerating his adjuvant systemic chemotherapy and it was discontinued after cycle #4. He is currently on observation but continues to have pain in the right side of the chest.  He was seen by Dr. Kipp Brood and underwent resection of the chest wall nodule and the final pathology was consistent with local recurrence of his disease and the right chest wall at the site of one of the chest wall tube. The patient was also found on recent MRI of the brain to have 4 new small brain metastasis that were treated with SRS. He completed palliative radiotherapy to the local disease recurrence in the right side of the chest wall likely from implants after his surgical resection. He is currently undergoing systemic chemotherapy with carboplatin for AUC of 5, Alimta 500 Mg/M2 and Keytruda 200 Mg IV every 3 weeks status post 7 cycles. Starting cycle #2 carboplatin was discontinued secondary to significant pancytopenia.   The patient continues to complain of increasing fatigue with this treatment.  I discussed with him again the option of discontinuing treatment with Alimta and maintaining him on single agent immunotherapy with Beryle Flock but the patient mentions that he would like to try 1 more time with the combination. I will see him back for  follow-up visit in 3 weeks for evaluation with repeat CT scan of the chest, abdomen and pelvis for restaging of his disease. For the multiple new metastatic brain lesions, he completed a course of whole brain irradiation. For the pain management, he is followed by the palliative care clinic and currently on treatment with MS Contin, MS IR as well as gabapentin. The patient was advised to call immediately if he has any other concerning symptoms in the interval. The patient voices understanding of current disease status and treatment options and is in agreement with the current care plan.  All questions were answered. The patient knows to call the clinic with any problems, questions or concerns. We can certainly see the patient much sooner if necessary.  The total time spent in the appointment was 30 minutes.  Disclaimer: This note was dictated with voice recognition software. Similar sounding words can inadvertently be transcribed and may not be corrected upon review.

## 2022-02-25 NOTE — Progress Notes (Signed)
[place]  [phone] [phone]   Name: [name] Date: 02/25/2022 MRN: [id number]  DOB: 10/06/1955  Patient Care Team: [name], MD as PCP - General (Family Medicine) [name], MD as PCP - Cardiology (Cardiology) Icard, Octavio Graves, DO as Consulting Physician (Pulmonary Disease) Maryanna Shape, NP as Nurse Practitioner (Oncology) Curt Bears, MD as Consulting Physician (Oncology) Pickenpack-Cousar, Carlena Sax, NP as Nurse Practitioner (Nurse Practitioner)    INTERVAL HISTORY: [name] is a 66 y.o. 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 He was admitted on 08/29/2021 and 09/15/21 from home with uncontrolled intractable chest wall pain. He has completed all of his radiation treatments. Now with plans to undergo immunotherapy as recommended by [facility].   SOCIAL HISTORY:     reports that he quit smoking about 6 years ago. His smoking use included cigarettes. He has a 33.00 pack-year smoking history. He has never used smokeless tobacco. He reports that he does not currently use alcohol. He reports that he does not use drugs.  ADVANCE DIRECTIVES:  None on file   CODE STATUS:   PAST MEDICAL HISTORY: Past Medical History:  Diagnosis Date   Anemia    Anxiety    Arthritis    Basal cell carcinoma (BCC) of forehead    CAD (coronary artery disease)    a. 10/2015 ant STEMI >> LHC with 3 v CAD; oLAD tx with POBA >> emergent CABG. b. Multiple evals since that time, early graft failure of SVG-RCA by cath 03/2016. c. 2/19  PCI/DES x1 to pRCA, normal EF.   Carotid artery disease ([place])    a. 40-59% BICA 02/2018.   Depression    Dyspnea    Ectopic atrial tachycardia (HCC)    Esophageal reflux    eosinophil esophagitis   Family history of adverse reaction to anesthesia    "sister has PONV" (06/21/2017)   Former tobacco use    Gout    Hepatitis C    "treated and cured" (06/21/2017)   High cholesterol    History of blood transfusion    History of kidney stones    Hypertension    Ischemic cardiomyopathy    a. EF 25-30% at intraop TEE 4/17  //  b. Limited Echo 5/17 - EF 45-50%, mild ant HK. c. EF 55-65% by cath 09/2017.   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[place])    Migraine    "3-4/yr" (06/21/2017)   Myocardial infarction ([place]) 10/2015   Palpitations    Pneumonia    Sinus bradycardia    a. HR dropping into 40s in 02/2016 -> BB reduced.   Stroke [facility]) 10/2016   "small one; sometimes my memory/cognitive issues" (06/21/2017)   Symptomatic hypotension    a. 02/2016 ER visit -> meds reduced.   Syncope    Wears dentures    Wears glasses     ALLERGIES:  is allergic to prednisone, tetanus toxoids, wellbutrin [bupropion], and varenicline.  MEDICATIONS:  Current Outpatient Medications  Medication Sig Dispense Refill   acetaminophen (TYLENOL) 500 MG tablet Take 1,000 mg by mouth every 6 (six) hours as needed for mild pain, fever or headache.  aspirin EC 81 MG tablet Take 81 mg by mouth daily. Swallow whole.     benzonatate (TESSALON) 100 MG capsule TAKE 1 CAPSULE(100 MG) BY MOUTH THREE TIMES DAILY AS NEEDED FOR COUGH 30 capsule 0   dexamethasone (DECADRON) 4 MG tablet Take 0.5 tablets (2 mg total) by mouth daily. Take 1 tablet x1, then 1/2 tablet daily 20 tablet 0   diclofenac (VOLTAREN) 25 MG EC tablet Take by mouth.     DULoxetine (CYMBALTA) 30 MG capsule Take 1 capsule (30 mg total) by mouth 2 (two) times daily. 60 capsule 1   folic acid (FOLVITE) 1 MG tablet Take 1 tablet (1 mg total) by mouth daily.  30 tablet 4   HYDROmorphone (DILAUDID) 8 MG tablet Take 1 tablet by mouth every 6 hours as needed for severe pain. 60 tablet 0   lidocaine-prilocaine (EMLA) cream Apply to the Port-A-Cath site 30-60-minute before treatment 30 g 0   LORazepam (ATIVAN) 1 MG tablet Take 1 tablet (1 mg total) by mouth every 8 (eight) hours as needed for anxiety. 60 tablet 0   magnesium oxide (MAG-OX) 400 (240 Mg) MG tablet Take 1 tablet by mouth daily.     metoprolol succinate (TOPROL-XL) 25 MG 24 hr tablet Take 1 tablet (25 mg total) by mouth daily. 90 tablet 3   morphine (MS CONTIN) 60 MG 12 hr tablet Take 1 tablet by mouth every 12  hours. 60 tablet 0   ondansetron (ZOFRAN) 8 MG tablet Take 1 tablet by mouth every 8 hours as needed for nausea or vomiting. Starting 3 days after chemotherapy 90 tablet 2   polyethylene glycol (MIRALAX / GLYCOLAX) 17 g packet Take 17 g by mouth daily. (Patient not taking: Reported on 11/27/2021) 14 each 0   prochlorperazine (COMPAZINE) 10 MG tablet Take 1 tablet (10 mg total) by mouth every 6 (six) hours as needed. 30 tablet 2   senna-docusate (SENOKOT-S) 8.6-50 MG tablet Take 1 tablet by mouth 2 (two) times daily. (Patient not taking: Reported on 12/21/2021)     tiZANidine (ZANAFLEX) 2 MG tablet Take 1 tablet (2 mg total) by mouth at bedtime as needed for muscle spasms. 30 tablet 2   XARELTO 20 MG TABS tablet Take 20 mg by mouth every evening.     No current facility-administered medications for this visit.   Facility-Administered Medications Ordered in Other Visits  Medication Dose Route Frequency Provider Last Rate Last Admin   sodium chloride flush (NS) 0.9 % injection 10 mL  10 mL Intracatheter PRN Curt Bears, MD   10 mL at 02/25/22 1330    VITAL SIGNS: There were no vitals taken for this visit. There were no vitals filed for this visit.   Estimated body mass index is 22.7 kg/m as calculated from the following:   Height as of an earlier encounter on 02/25/22: 5\' 9"   (1.753 m).   Weight as of an earlier encounter on 02/25/22: 153 lb 11.2 oz (69.7 kg).   PERFORMANCE STATUS (ECOG) : 1 - Symptomatic but completely ambulatory   Physical Exam General: NAD, thin  Pulmonary: normal breathing pattern, diminished   Cardio: Tachycardic  Skin: Dry, flaky related to radiation Neurological: AAOx 4, appears comfortable  IMPRESSION:  Mr. Tuohy presents to the clinic today for symptom management follow-up. His wife is with him today. Seen by Dr. [name] today also. He has tolerated radiation treatments.  He was walking his dog yesterday and dog suddenly took off after a cat causing him to  fall.  Left facial cheek laceration and left forehead.  Denies any other injuries.  He shares goal is to continue to treat the treatable allowing him every opportunity to thrive.    Neoplasm related pain Pain remains controlled. He has tolerated weaning process over the past months without any further difficulty.  Reports pain is mainly in his back area at this time.  We will continue on current regimen without any changes due to recent radiation.  He has not required medication as frequently as previously did.  Was able to extend his refill need out for an additional week.  We will closely monitor and adjust as needed.  MS Contin 60 mg every 12 hours x 1 months (120 mg/day) June  MS Contin 45 mg every 12 hours x 1 month (90mg /day) July (on hold) MS Contin 30 mg every 12 hours x 1 month (60 mg/day) August  MS Contin 15 mg every 12 hours x1 month (30 mg/day) Sept   Hydromorphone 8 mg every 6 hours x 2 weeks (32 mg/day) 6/29 (on hold) Hydromorphone 4 mg every 4 hours x2 weeks (24 mg/day) Hydromorphone 4 mg every 6 hours x 3 weeks (16 mg) then we will further discuss   Constipation Reports bowel habits have been regular over the past 4 days. There was some previous challenges over the past month due to colitis.   Decreased appetite/weight loss Appetite somewhat improving.   States his appetite generally decreases with treatment cycles.  Able to eat several days before and after before he notices a decrease in desire.  Weight is stable at 153.  Previous weight 154.14lbs, down from 156 lb 6/1, 160 lbs 5/18, 168.6 lbs 4/24, down from 170 lbs on 4/20, has increased to 178 lbs 3/22, 169 lbs 2/21. Discussed importance of small frequent meals and increased protein intake. Goal is to gain better control and see some improvement over time.   4. Nausea/Vomiting   Is tolerating ondansetron.  Experiencing increased nausea with recent radiation.  Dr. Lisbeth Renshaw started him on Ativan which she reports is most effective.   PLAN: MS Contin as prescribed (see above) Dilaudid as needed with plans to continue weaning down over time (see above).  Cymbalta as prescribed Zofran and compazine as needed for ongoing nausea/vomiting.  Ativan as needed for nausea/anxiety  Miralax and Colace daily for bowel regimen. We will continue to closely monitor medications and evaluate best pain regimen. With a goal of weaning while also managing pain effectively.  I will plan to see him back in 3-4 weeks in collaboration with his other oncology appointments.  He knows to call with any needs prior to next visit.   Patient expressed understanding and was in agreement with this plan. He also understands that He can call the clinic at any time with any questions, concerns, or complaints.   Number and complexity of problems addressed: 3 HIGH - 1 or more chronic illnesses with SEVERE exacerbation, progression, or side effects of treatment - advanced cancer, pain. Any controlled substances utilized were prescribed in the context of palliative care.  Time Total: 35 min   Visit consisted of counseling and education dealing with the complex and emotionally intense issues of symptom management and palliative care in the setting of serious and potentially life-threatening illness.Greater than 50%  of this time was  spent counseling and coordinating care related to the above assessment and plan.  Alda Lea, AGPCNP-BC  Palliative Medicine Team/[place]

## 2022-02-25 NOTE — Progress Notes (Signed)
Nutrition follow-up completed with patient receiving treatment for stage IV large cell narrow endocrine with small metastatic brain lesion.  Patient currently on Keytruda and Alimta.  Weight documented as 153 pounds 11.2 ounces on June 29.  Noted patient with recent abdominal pain with likely colitis. Patient reports intermittent nausea but denies vomiting, constipation, diarrhea. He does have fatigue. Reports he does not have a taste for meat right now.  He has tried various foods and can only eat some bites. Reports loving mashed potatoes at this point. Does not care much for oral nutrition supplements.  Nutrition diagnosis: Unintentional weight loss stabilizing.  Intervention: Reviewed importance of consuming small amounts of food more often.  Encourage 3 meals +3 snacks. Recommended: Protein sources. Reviewed ways to add calories to foods patient is tolerating. Provided nutrition fact sheet.  Questions answered.  Teach back method used.  Monitoring, evaluation, goals: Patient will tolerate increased calories and protein to minimize further weight loss.  Next visit: Wednesday, July 19 during infusion.  **Disclaimer: This note was dictated with voice recognition software. Similar sounding words can inadvertently be transcribed and this note may contain transcription errors which may not have been corrected upon publication of note.**

## 2022-03-01 ENCOUNTER — Other Ambulatory Visit: Payer: Self-pay | Admitting: Physician Assistant

## 2022-03-01 DIAGNOSIS — R059 Cough, unspecified: Secondary | ICD-10-CM

## 2022-03-05 ENCOUNTER — Telehealth: Payer: Self-pay | Admitting: *Deleted

## 2022-03-05 ENCOUNTER — Other Ambulatory Visit: Payer: Self-pay | Admitting: Radiation Oncology

## 2022-03-05 MED ORDER — DEXAMETHASONE 4 MG PO TABS: 4.0000 mg | ORAL_TABLET | Freq: Every day | ORAL | 0 refills | Status: DC

## 2022-03-05 NOTE — Telephone Encounter (Signed)
Spoke with the patient's wife who voiced Ryot was having some trouble since completing his radiation treatments on 02/22/2022.  She reports he has been experiencing increased confusion, unstable gait, decreased vision and loss of bowel and bladder.  He was seen by Dr. Lisbeth Renshaw on 6/23 and was prescribed Decadron 2mg  daily due to reports of increased confusion and unstable gait.  Patient stated he did not start his steroids until around 6/27.  His wife reports she has not seen any changes since he started on Decadron.  I spoke with Dr. Lisbeth Renshaw and voiced their concerns and his recommendation was to increase his steroid dose to 4 mg daily and plans to re-evaluate next week.  His wife was informed and verbalized understanding of these instructions.  New prescription was sent to [facility] in [place].  They were advised to report to the nearest emergency room if his symptoms become worse or new symptoms arise.  She verbalized understanding.     Gloriajean Dell. Leonie Green, BSN

## 2022-03-06 ENCOUNTER — Other Ambulatory Visit: Payer: Self-pay | Admitting: Internal Medicine

## 2022-03-08 ENCOUNTER — Encounter: Payer: Self-pay | Admitting: Physician Assistant

## 2022-03-08 NOTE — Progress Notes (Unsigned)
Office Visit    Patient Name: [name] Date of Encounter: 03/09/2022  PCP:  [name], MD   [place]  Cardiologist:  Larae Grooms, MD  Advanced Practice Provider:  No care team member to display Electrophysiologist:  None    HPI    FRENCH KENDRA is a 66 y.o. 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    His coronary artery disease dates back to 2017 with NSTEMI and emergent CABG.  He had multiple episodes of chest pain since that time often thought to be anxiety.  Multiple cardiac catheterizations 2018 and stress test with no PCI.  09/2017 cath and PCI of RCA due to narrowing of the graft of the distal RCA.  Echo 08/2018 with LVEF 55 to 60%, no valvular abnormalities.  CTA with mild prominence of a sending aortic aneurysm 3.9 x 3.9 cm.  Cardiac catheterization 03/2019 due to chest pain with patent LIMA to LAD/SVG to OM, SVG to PDA patent with proximal disease which is unchanged.  Also patent stent to RCA, graft to diagonal was occluded, normal LVEF.  Recommended medical therapy.  He had COVID-19 09/2020 but was not hospitalized.  ED visit 09/2020 for chest pain with CT concerning for primary bronchogenic carcinoma.  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  He underwent stereotactic radiosurgery, right lobectomy, and chemotherapy.  Admitted 11/2020 with a right upper lobectomy developed atrial fibrillation with RVR.  Monitor at discharge with no recurrent atrial fibrillation.  As he was maintaining normal sinus rhythm, anticoagulation was deferred on discharge.  ED visit 04/26/2021 was diagnosed with PE started  on Xarelto.  Hospitalized 03/2021 with anemia requiring 1 unit PRBC.  Seen in the clinic 05/13/2021 no changes were made at that time.  Admitted 06/06/2021 through 06/11/2021 with hemoptysis without anemia, right-sided chest pain presumed due to coughing, orthostatic hypotension.  Antihypertensives were held and midodrine 2.5 mg 3 times daily initiated.  He was seen in the office 06/15/2021 for follow-up.  He had had difficulty tolerating chemotherapy and was glad to be done.  His appetite lacks and he drinks approximately 48 ounces of fluid per day.  He feels okay sitting or lying down but when up moving around he was feeling tachycardic, lightheaded, and unsteady.  Heart rate was as high as 140s to 150s just with walking.  No chest pain, no syncope nor near syncope.  Today, he states that he is having some side effects from his radiation.  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  Balance has been a big issue and he uses a cane to walk now.  He is done with his radiation but is on and last chemotherapy.  He has an MRI and a CT scan scheduled for next week.  He states that sometimes he gets dizzy and lightheaded especially when he first gets up in the morning.  He has been eating small meals throughout the day but probably is not getting enough calories to sustain body weight.  He has been drinking 3-4 bottles of water which are about 16 ounces each.  I encouraged him to get at least 64 ounces of water and to incorporate an electrolyte drink.  He  has low blood pressure today but is asymptomatic.  We discussed possibly adding midodrine but instead decided to decrease his metoprolol succinate to 12 and half milligrams daily.  He is to take his blood pressure daily and record those values.  We will plan to follow-up with a nursing blood pressure check in 1 to 2 weeks.  Reports no shortness of breath nor dyspnea on exertion. Reports no chest pain, pressure, or tightness. No edema, orthopnea, PND. Reports no palpitations.     Past Medical History    Past Medical History:  Diagnosis Date   Anemia    Anxiety    Arthritis    Basal cell carcinoma (BCC) of forehead    CAD (coronary artery disease)    a. 10/2015 ant STEMI >> LHC with 3 v CAD; oLAD tx with POBA >> emergent CABG. b. Multiple evals since that time, early graft failure of SVG-RCA by cath 03/2016. c. 2/19 PCI/DES x1 to pRCA, normal EF.   Carotid artery disease ([place])    a. 40-59% BICA 02/2018.   Depression    Dyspnea    Ectopic atrial tachycardia (HCC)    Esophageal reflux    eosinophil esophagitis   Family history of adverse reaction to anesthesia    "sister has PONV" (06/21/2017)   Former tobacco use    Gout    Hepatitis C    "treated and cured" (06/21/2017)   High cholesterol    History of blood transfusion    History of kidney stones    Hypertension    Ischemic cardiomyopathy    a. EF 25-30% at intraop TEE 4/17  //  b. Limited Echo 5/17 - EF 45-50%, mild ant HK. c. EF 55-65% by cath 09/2017.   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[place])    Migraine    "3-4/yr" (06/21/2017)   Myocardial infarction ([place]) 10/2015   Palpitations    Pneumonia    Sinus bradycardia    a. HR dropping into 40s in 02/2016 -> BB reduced.   Stroke [facility]) 10/2016   "small one; sometimes my memory/cognitive issues" (06/21/2017)   Symptomatic hypotension    a. 02/2016 ER visit -> meds reduced.   Syncope    Wears dentures    Wears glasses    Past Surgical History:  Procedure Laterality Date   25 GAUGE PARS PLANA VITRECTOMY WITH 20 GAUGE MVR PORT  12/31/2020   Procedure: 25 GAUGE PARS PLANA VITRECTOMY WITH 20 GAUGE MVR PORT;  Surgeon: [name], MD;  Location: [place];  Service: Thoracic;;   ANTERIOR CERVICAL DECOMP/DISCECTOMY FUSION N/A 10/17/2018   Procedure: Anterior Cervical Decompression Fusion - Cervical seven -Thoracic one;  Surgeon: Consuella Lose, MD;  Location: [place];  Service: Neurosurgery;  Laterality: N/A;   BASAL CELL CARCINOMA EXCISION      "forehead   BIOPSY  07/20/2019   Procedure: BIOPSY;  Surgeon: Carol Ada, MD;  Location: [facility];  Service: Endoscopy;;   BIOPSY OF MEDIASTINAL MASS Right 07/29/2021   Procedure: RIGHT CHEST WALL MASS BIOPSY;  Surgeon: [name], MD;  Location: [place];  Service: Thoracic;  Laterality: Right;   BRONCHIAL BIOPSY  12/24/2020   Procedure: BRONCHIAL BIOPSIES;  Surgeon: Garner Nash, DO;  Location: [place] [facility];  Service: Pulmonary;;   BRONCHIAL BRUSHINGS  12/24/2020   Procedure: BRONCHIAL BRUSHINGS;  Surgeon: Garner Nash, DO;  Location: [place];  Service: Pulmonary;;   BRONCHIAL NEEDLE ASPIRATION BIOPSY  12/24/2020   Procedure: BRONCHIAL NEEDLE ASPIRATION BIOPSIES;  Surgeon:  Garner Nash, DO;  Location: [place] [facility];  Service: Pulmonary;;   CARDIAC CATHETERIZATION N/A 11/28/2015   Procedure: Left Heart Cath and Coronary Angiography;  Surgeon: [name], MD;  Location: [place] CV LAB;  Service: Cardiovascular;  Laterality: N/A;   CARDIAC CATHETERIZATION N/A 11/28/2015   Procedure: Coronary Balloon Angioplasty;  Surgeon: [name], MD;  Location: [place] CV LAB;  Service: Cardiovascular;  Laterality: N/A;  ostial LAD   CARDIAC CATHETERIZATION N/A 11/28/2015   Procedure: Coronary/Graft Angiography;  Surgeon: [name], MD;  Location: [place] CV LAB;  Service: Cardiovascular;  Laterality: N/A;  coronaries only    CARDIAC CATHETERIZATION N/A 04/21/2016   Procedure: Left Heart Cath and Coronary Angiography;  Surgeon: Wellington Hampshire, MD;  Location: [place] CV [facility];  Service: Cardiovascular;  Laterality: N/A;   CARDIAC CATHETERIZATION N/A 06/14/2016   Procedure: Left Heart Cath and Cors/Grafts Angiography;  Surgeon: Lorretta Harp, MD;  Location: [place] CV LAB;  Service: Cardiovascular;  Laterality: N/A;   CARDIAC CATHETERIZATION N/A 09/08/2016   Procedure: Left Heart Cath and Cors/Grafts Angiography;  Surgeon: Wellington Hampshire, MD;  Location: [place] CV LAB;  Service: Cardiovascular;  Laterality: N/A;   CARDIAC CATHETERIZATION     CORONARY ARTERY BYPASS GRAFT N/A 11/28/2015   Procedure: CORONARY ARTERY BYPASS GRAFTING (CABG) TIMES FIVE USING LEFT INTERNAL MAMMARY ARTERY AND RIGHT GREATER SAPHENOUS,VIEN HARVEATED BY ENDOVIEN, INTRAOPPRATIVE TEE;  Surgeon: Gaye Pollack, MD;  Location: [place];  Service: Open Heart Surgery;  Laterality: N/A;   CORONARY STENT INTERVENTION N/A 10/05/2017   Procedure: CORONARY STENT INTERVENTION;  Surgeon: [name], MD;  Location: [place] CV LAB;  Service: Cardiovascular;  Laterality: N/A;   ESOPHAGOGASTRODUODENOSCOPY (EGD) WITH PROPOFOL N/A 07/20/2019   Procedure: ESOPHAGOGASTRODUODENOSCOPY (EGD) WITH PROPOFOL;  Surgeon: Carol Ada, MD;  [facility];  Service: Endoscopy;  Laterality: N/A;   ESOPHAGOGASTRODUODENOSCOPY (EGD) WITH PROPOFOL N/A 01/30/2021   Procedure: ESOPHAGOGASTRODUODENOSCOPY (EGD) WITH PROPOFOL;  Surgeon: Carol Ada, MD;  [facility];  Service: Endoscopy;  Laterality: N/A;   FIDUCIAL MARKER PLACEMENT  12/24/2020   Procedure: FIDUCIAL DYE MARKER PLACEMENT;  Surgeon: Garner Nash, DO;  Location: [place] [facility];  Service: Pulmonary;;   HUMERUS SURGERY Right 1969   "tumor inside bone; filled it w/bone chips"   INTERCOSTAL NERVE BLOCK Right 12/24/2020   Procedure: INTERCOSTAL NERVE BLOCK;  Surgeon: [name], MD;  Location: [place];  Service: Thoracic;  Laterality: Right;   IR IMAGING GUIDED PORT INSERTION  02/06/2021   LEFT HEART CATH AND CORS/GRAFTS ANGIOGRAPHY N/A 03/11/2017   Procedure: Left Heart Cath and Cors/Grafts Angiography;  Surgeon: Leonie Man, MD;  Location: [place] CV LAB;  Service: Cardiovascular;  Laterality: N/A;   LEFT HEART CATH AND CORS/GRAFTS ANGIOGRAPHY N/A 10/05/2017   Procedure: LEFT HEART CATH AND CORS/GRAFTS ANGIOGRAPHY;  Surgeon: [name], MD;  Location: [place] CV LAB;   Service: Cardiovascular;  Laterality: N/A;   LEFT HEART CATH AND CORS/GRAFTS ANGIOGRAPHY N/A 04/11/2019   Procedure: LEFT HEART CATH AND CORS/GRAFTS ANGIOGRAPHY;  Surgeon: [name], MD;  Location: [place] CV LAB;  Service: Cardiovascular;  Laterality: N/A;   NODE DISSECTION Right 12/24/2020   Procedure: NODE DISSECTION;  Surgeon: [name], MD;  Location: [place];  Service: Thoracic;  Laterality: Right;   PERIPHERAL VASCULAR CATHETERIZATION N/A 06/14/2016   Procedure: Lower Extremity Angiography;  Surgeon: Lorretta Harp, MD;  Location: [place] CV  LAB;  Service: Cardiovascular;  Laterality: N/A;   VIDEO BRONCHOSCOPY WITH ENDOBRONCHIAL NAVIGATION Right 12/24/2020   Procedure: VIDEO BRONCHOSCOPY WITH ENDOBRONCHIAL NAVIGATION;  Surgeon: Garner Nash, DO;  Location: [place];  Service: Pulmonary;  Laterality: Right;   VIDEO BRONCHOSCOPY WITH ENDOBRONCHIAL ULTRASOUND N/A 12/24/2020   Procedure: VIDEO BRONCHOSCOPY WITH ENDOBRONCHIAL ULTRASOUND;  Surgeon: Garner Nash, DO;  Location: [place];  Service: Pulmonary;  Laterality: N/A;   VIDEO BRONCHOSCOPY WITH INSERTION OF INTERBRONCHIAL VALVE (IBV) N/A 12/31/2020   Procedure: VIDEO BRONCHOSCOPY WITH INSERTION OF INTERBRONCHIAL VALVE (IBV).VALVE IN CARTRIDGE 2mm,9mm. CHEST TUBE PLACEMENT.;  Surgeon: [name], MD;  Location: [facility];  Service: Thoracic;  Laterality: N/A;   VIDEO BRONCHOSCOPY WITH INSERTION OF INTERBRONCHIAL VALVE (IBV) N/A 07/29/2021   Procedure: VIDEO BRONCHOSCOPY WITH REMOVAL OF INTERBRONCHIAL VALVE (IBV);  Surgeon: [name], MD;  Location: [facility];  Service: Thoracic;  Laterality: N/A;    Allergies  Allergies  Allergen Reactions   Prednisone Other (See Comments)    States that this med makes him "crazy"   Tetanus Toxoids Swelling and Other (See Comments)    Fever, Swelling of the arm    Wellbutrin [Bupropion] Other (See Comments)    Crazy thoughts, nightmares    Varenicline Other (See Comments)    Unpleasant dreams     EKGs/Labs/Other Studies Reviewed:   The following studies were reviewed today:  12/17/21 CT angio of the chest  IMPRESSION: 1. No evidence of pulmonary emboli. 2. Bilateral pulmonary nodules consistent with known history of metastatic lung cancer. No change since recent staging exam. 3. Stable mediastinal and right hilar adenopathy consistent with metastatic disease. 4. Stable right pleural thickening and small loculated right pleural effusion. 5. Stable changes of the right posterior eleventh and twelfth ribs, which may reflect postsurgical change or underlying metastatic disease. Chronic right posterior eleventh rib fracture again noted. 6. No acute intra-abdominal or intrapelvic process. 7.  Aortic Atherosclerosis (ICD10-I70.0).   Echo 12/27/20 1. Left ventricular ejection fraction, by estimation, is 55 to 60%. The  left ventricle has normal function. The left ventricle has no regional  wall motion abnormalities. Left ventricular diastolic parameters are  consistent with Grade I diastolic  dysfunction (impaired relaxation).   2. Right ventricular systolic function is normal. The right ventricular  size is normal.   3. The mitral valve is normal in structure. No evidence of mitral valve  regurgitation. No evidence of mitral stenosis.   4. The aortic valve is normal in structure. Aortic valve regurgitation is  not visualized. No aortic stenosis is present.   5. The inferior vena cava is normal in size with greater than 50%  respiratory variability, suggesting right atrial pressure of 3 mmHg.    Monitor 02/06/21 Normal sinus rhythm with rare PACs and PVCs. Brief, string of PACs not associated with symptoms. No sustained pathologic arrhythmias.     Patch Wear Time:  14 days and 0 hours (2022-05-14T12:06:36-0400 to 2022-05-28T12:06:36-0400)   Patient had a min HR of 45 bpm, max HR of 203 bpm, and avg HR of 82 bpm.  Predominant underlying rhythm was Sinus Rhythm. 1 run of Supraventricular Tachycardia occurred lasting 6 beats with a max rate of 203 bpm (avg 199 bpm). Isolated SVEs were rare  (<1.0%), SVE Couplets were rare (<1.0%), and SVE Triplets were rare (<1.0%). Isolated VEs were rare (<1.0%), and no VE Couplets or VE Triplets were present.     EKG:  EKG is  ordered today.  The ekg  ordered today demonstrates NSR rate 78 bpm  Recent Labs: 06/06/2021: B Natriuretic Peptide 83.3 01/14/2022: Magnesium 1.9 02/22/2022: TSH 0.450 02/25/2022: ALT 25; BUN 20; Creatinine 0.97; Hemoglobin 8.6; Platelet Count 241; Potassium 4.2; Sodium 136  Recent Lipid Panel    Component Value Date/Time   CHOL 82 12/27/2020 0125   CHOL 114 11/08/2017 0833   TRIG 97 12/27/2020 0125   HDL 35 (L) 12/27/2020 0125   HDL 52 11/08/2017 0833   CHOLHDL 2.3 12/27/2020 0125   VLDL 19 12/27/2020 0125   LDLCALC 28 12/27/2020 0125   LDLCALC 43 11/08/2017 0833    Risk Assessment/Calculations:   CHA2DS2-VASc Score = 2   This indicates a 2.2% annual risk of stroke. The patient's score is based upon: CHF History: 0 HTN History: 0 Diabetes History: 0 Stroke History: 0 Vascular Disease History: 1 Age Score: 1 Gender Score: 0     Home Medications   Current Meds  Medication Sig   acetaminophen (TYLENOL) 500 MG tablet Take 1,000 mg by mouth every 6 (six) hours as needed for mild pain, fever or headache.   benzonatate (TESSALON) 100 MG capsule TAKE 1 CAPSULE(100 MG) BY MOUTH THREE TIMES DAILY AS NEEDED FOR COUGH   dexamethasone (DECADRON) 4 MG tablet Take 1 tablet (4 mg total) by mouth daily.   DULoxetine (CYMBALTA) 30 MG capsule Take 1 capsule (30 mg total) by mouth 2 (two) times daily.   folic acid (FOLVITE) 1 MG tablet Take 1 tablet (1 mg total) by mouth daily.   HYDROmorphone (DILAUDID) 8 MG tablet Take 1 tablet by mouth every 6 hours as needed for severe pain.   lidocaine-prilocaine (EMLA) cream Apply to the Port-A-Cath site  30-60-minute before treatment   LORazepam (ATIVAN) 1 MG tablet Take 1 tablet (1 mg total) by mouth every 8 (eight) hours as needed for anxiety.   magnesium oxide (MAG-OX) 400 (240 Mg) MG tablet Take 1 tablet by mouth daily.   metoprolol succinate (TOPROL-XL) 25 MG 24 hr tablet Take 0.5 tablets (12.5 mg total) by mouth daily.   morphine (MS CONTIN) 60 MG 12 hr tablet Take 1 tablet by mouth every 12  hours.   ondansetron (ZOFRAN) 8 MG tablet Take 1 tablet by mouth every 8 hours as needed for nausea or vomiting. Starting 3 days after chemotherapy   polyethylene glycol (MIRALAX / GLYCOLAX) 17 g packet Take 17 g by mouth daily.   prochlorperazine (COMPAZINE) 10 MG tablet Take 1 tablet (10 mg total) by mouth every 6 (six) hours as needed.   senna-docusate (SENOKOT-S) 8.6-50 MG tablet Take 1 tablet by mouth 2 (two) times daily.   tiZANidine (ZANAFLEX) 2 MG tablet Take 1 tablet (2 mg total) by mouth at bedtime as needed for muscle spasms.   XARELTO 20 MG TABS tablet Take 20 mg by mouth every evening.   [DISCONTINUED] metoprolol succinate (TOPROL-XL) 25 MG 24 hr tablet Take 1 tablet (25 mg total) by mouth daily.     Review of Systems      All other systems reviewed and are otherwise negative except as noted above.  Physical Exam    VS:  BP (!) 84/62   Pulse 78   Ht 5\' 9"  (1.753 m)   Wt 153 lb 6.4 oz (69.6 kg)   SpO2 100%   BMI 22.65 kg/m  , BMI Body mass index is 22.65 kg/m.  Repeat 102/52  Wt Readings from Last 3 Encounters:  03/09/22 153 lb 6.4 oz (69.6 kg)  02/25/22 153  lb 11.2 oz (69.7 kg)  02/02/22 154 lb 14.4 oz (70.3 kg)     GEN: Well nourished, well developed, in no acute distress. HEENT: normal. Neck: Supple, no JVD, carotid bruits, or masses. Cardiac: RRR, no murmurs, rubs, or gallops. No clubbing, cyanosis, edema.  Radials/PT 2+ and equal bilaterally.  Respiratory:  Respirations regular and unlabored, clear to auscultation bilaterally. GI: Soft, nontender,  nondistended. MS: No deformity or atrophy. Skin: Warm and dry, no rash. Neuro:  Strength and sensation are intact. Psych: Normal affect.  Assessment & Plan    Hypotension -decrease metoprolol succinate to 12.5mg  daily.  -encouraged daily BP checks -follow-up in 1-2 weeks with a nursing BP check -encouraged more sodium in his diet -encouraged an electrolyte drink a day -may require midodrine if BP continues to be low  CAD status post CABG and DES to RCA -no chest pain -continue GDMT as tolerated: Metoprolol succinate, reduced to 12.5 mg, xarelto 20mg  daily  Anemia -Most recent hemoglobin 8.6 -Recent episode of tachycardia thought to be due to anemia  PAF - monitor 06/2021 which showed primarily normal sinus rhythm, patient triggered episodes associated with sinus tachycardia likely due to deconditioning from chemotherapy -metoprolol succinate 12.5mg    PE -Tolerating Xarelto without any bleeding  -Recent CT scan 4/23 showed no evidence of PE  HLD, LDL goal less than 70 -lipid panel needs to be repeated when he is next seen in the office  Hypertension/orthostatic hypotension -carotid duplex without any evidence of stenosis   Disposition: Follow up in 3 months with Larae Grooms, MD or APP.  Signed, Elgie Collard, PA-C 03/09/2022, 12:54 PM  [facility]

## 2022-03-09 ENCOUNTER — Telehealth: Payer: Self-pay | Admitting: *Deleted

## 2022-03-09 ENCOUNTER — Ambulatory Visit (INDEPENDENT_AMBULATORY_CARE_PROVIDER_SITE_OTHER): Payer: Medicare Other | Admitting: Physician Assistant

## 2022-03-09 ENCOUNTER — Encounter: Payer: Self-pay | Admitting: Physician Assistant

## 2022-03-09 VITALS — BP 84/62 | HR 78 | Ht 69.0 in | Wt 153.4 lb

## 2022-03-09 DIAGNOSIS — R42 Dizziness and giddiness: Secondary | ICD-10-CM

## 2022-03-09 DIAGNOSIS — I480 Paroxysmal atrial fibrillation: Secondary | ICD-10-CM | POA: Diagnosis not present

## 2022-03-09 DIAGNOSIS — I951 Orthostatic hypotension: Secondary | ICD-10-CM

## 2022-03-09 DIAGNOSIS — E785 Hyperlipidemia, unspecified: Secondary | ICD-10-CM | POA: Diagnosis not present

## 2022-03-09 DIAGNOSIS — I25119 Atherosclerotic heart disease of native coronary artery with unspecified angina pectoris: Secondary | ICD-10-CM | POA: Diagnosis not present

## 2022-03-09 DIAGNOSIS — R000 Tachycardia, unspecified: Secondary | ICD-10-CM

## 2022-03-09 MED ORDER — METOPROLOL SUCCINATE ER 25 MG PO TB24: 12.5000 mg | ORAL_TABLET | Freq: Every day | ORAL | 3 refills | Status: DC

## 2022-03-09 NOTE — Patient Instructions (Signed)
Medication Instructions:  1.Decrease metoprolol succinate (Toprol XL) to 12.5 mg daily, this will be one-half of the 25 mg tablet *If you need a refill on your cardiac medications before your next appointment, please call your pharmacy*   Lab Work: None If you have labs (blood work) drawn today and your tests are completely normal, you will receive your results only by: [place] (if you have MyChart) OR A paper copy in the mail If you have any lab test that is abnormal or we need to change your treatment, we will call you to review the results.   Follow-Up: At [facility], you and your health needs are our priority.  As part of our continuing mission to provide you with exceptional heart care, we have created designated Provider Care Teams.  These Care Teams include your primary Cardiologist (physician) and Advanced Practice Providers (APPs -  Physician Assistants and Nurse Practitioners) who all work together to provide you with the care you need, when you need it.   Your next appointment:   Nurse visit in 1-2 weeks for a blood pressure check    Then 3 month(s)  The format for your next appointment:   In Person  Provider:   Larae Grooms, MD  or Nicholes Rough, PA-C       Other Instructions 1.Be sure to drink 64 oz of water daily and also incorporate 1-2 electrolyte drinks  2.Check your blood pressure daily one hour after meds and 1st meal of the day over the next week and call us with the readings  Important Information About Sugar

## 2022-03-09 NOTE — Telephone Encounter (Signed)
Spoke with the patient to see how he was feeling since we made some adjustments to his steroid dose.  He reports he has not taken his steroid for the past couple of days due to "not being able to sleep".  He reports continued memory loss and confusion and some word finding.  He states he has some constipation and is taking otc stool softeners.  He reports urinary frequency and urgency with some incontinence at night.  He states he would like to stop his steroids and see how he does without them.  I let him know that I spoke the [facility] and he is advised to continue taking the steroids.  He states he really wants to get off the steroids, stating "Dr. [name] really wants to keep me off steroids".  I let know I would relay this to the PA and Dr. Lisbeth Renshaw and if they agree we will send taper instructions via MyChart message.  He verbalized understanding.  Gloriajean Dell. Leonie Green, BSN

## 2022-03-11 ENCOUNTER — Other Ambulatory Visit: Payer: Self-pay | Admitting: Nurse Practitioner

## 2022-03-11 ENCOUNTER — Telehealth: Payer: Self-pay

## 2022-03-11 MED ORDER — MORPHINE SULFATE ER 60 MG PO TBCR: 60.0000 mg | EXTENDED_RELEASE_TABLET | Freq: Two times a day (BID) | ORAL | 0 refills | Status: DC

## 2022-03-11 MED ORDER — HYDROMORPHONE HCL 8 MG PO TABS: 8.0000 mg | ORAL_TABLET | Freq: Four times a day (QID) | ORAL | 0 refills | Status: DC | PRN

## 2022-03-11 NOTE — Telephone Encounter (Signed)
Pt requested refill of dilaudid, script sent to preferred pharmacy.

## 2022-03-12 ENCOUNTER — Other Ambulatory Visit: Payer: Self-pay | Admitting: Nurse Practitioner

## 2022-03-12 ENCOUNTER — Telehealth: Payer: Self-pay

## 2022-03-12 ENCOUNTER — Other Ambulatory Visit (HOSPITAL_COMMUNITY): Payer: Self-pay

## 2022-03-12 MED ORDER — HYDROMORPHONE HCL 8 MG PO TABS
8.0000 mg | ORAL_TABLET | Freq: Four times a day (QID) | ORAL | 0 refills | Status: DC | PRN
Start: 2022-03-12 — End: 2022-03-29
  Filled 2022-03-12: qty 60, 15d supply, fill #0

## 2022-03-12 NOTE — Telephone Encounter (Signed)
Pt called reporting that he could not pick up his medication. Appears to be a duplicate order, order clarified with pharmacy, ready for pick up, pt notified.

## 2022-03-14 ENCOUNTER — Inpatient Hospital Stay (HOSPITAL_BASED_OUTPATIENT_CLINIC_OR_DEPARTMENT_OTHER)
Admission: EM | Admit: 2022-03-14 | Discharge: 2022-03-17 | DRG: 843 | Disposition: A | Discharge status: Home / Self Care | Payer: Medicare Other | Attending: Family Medicine | Admitting: Family Medicine

## 2022-03-14 ENCOUNTER — Emergency Department (HOSPITAL_BASED_OUTPATIENT_CLINIC_OR_DEPARTMENT_OTHER): Payer: Medicare Other

## 2022-03-14 ENCOUNTER — Encounter (HOSPITAL_BASED_OUTPATIENT_CLINIC_OR_DEPARTMENT_OTHER): Payer: Self-pay

## 2022-03-14 ENCOUNTER — Other Ambulatory Visit: Payer: Self-pay

## 2022-03-14 DIAGNOSIS — E860 Dehydration: Secondary | ICD-10-CM | POA: Diagnosis present

## 2022-03-14 DIAGNOSIS — I129 Hypertensive chronic kidney disease with stage 1 through stage 4 chronic kidney disease, or unspecified chronic kidney disease: Secondary | ICD-10-CM | POA: Diagnosis present

## 2022-03-14 DIAGNOSIS — R197 Diarrhea, unspecified: Secondary | ICD-10-CM | POA: Diagnosis not present

## 2022-03-14 DIAGNOSIS — R2689 Other abnormalities of gait and mobility: Secondary | ICD-10-CM

## 2022-03-14 DIAGNOSIS — K219 Gastro-esophageal reflux disease without esophagitis: Secondary | ICD-10-CM | POA: Diagnosis present

## 2022-03-14 DIAGNOSIS — F419 Anxiety disorder, unspecified: Secondary | ICD-10-CM | POA: Diagnosis present

## 2022-03-14 DIAGNOSIS — J9811 Atelectasis: Secondary | ICD-10-CM | POA: Diagnosis present

## 2022-03-14 DIAGNOSIS — Z951 Presence of aortocoronary bypass graft: Secondary | ICD-10-CM

## 2022-03-14 DIAGNOSIS — D72819 Decreased white blood cell count, unspecified: Secondary | ICD-10-CM | POA: Diagnosis present

## 2022-03-14 DIAGNOSIS — D539 Nutritional anemia, unspecified: Secondary | ICD-10-CM | POA: Diagnosis present

## 2022-03-14 DIAGNOSIS — C3411 Malignant neoplasm of upper lobe, right bronchus or lung: Secondary | ICD-10-CM | POA: Diagnosis present

## 2022-03-14 DIAGNOSIS — Z515 Encounter for palliative care: Secondary | ICD-10-CM | POA: Diagnosis not present

## 2022-03-14 DIAGNOSIS — Z888 Allergy status to other drugs, medicaments and biological substances status: Secondary | ICD-10-CM | POA: Diagnosis not present

## 2022-03-14 DIAGNOSIS — I480 Paroxysmal atrial fibrillation: Secondary | ICD-10-CM | POA: Diagnosis not present

## 2022-03-14 DIAGNOSIS — G9341 Metabolic encephalopathy: Secondary | ICD-10-CM | POA: Diagnosis not present

## 2022-03-14 DIAGNOSIS — R6510 Systemic inflammatory response syndrome (SIRS) of non-infectious origin without acute organ dysfunction: Secondary | ICD-10-CM | POA: Diagnosis not present

## 2022-03-14 DIAGNOSIS — C7931 Secondary malignant neoplasm of brain: Secondary | ICD-10-CM | POA: Diagnosis present

## 2022-03-14 DIAGNOSIS — Z20822 Contact with and (suspected) exposure to covid-19: Secondary | ICD-10-CM | POA: Diagnosis present

## 2022-03-14 DIAGNOSIS — N1831 Chronic kidney disease, stage 3a: Secondary | ICD-10-CM | POA: Diagnosis present

## 2022-03-14 DIAGNOSIS — Z902 Acquired absence of lung [part of]: Secondary | ICD-10-CM

## 2022-03-14 DIAGNOSIS — D849 Immunodeficiency, unspecified: Secondary | ICD-10-CM | POA: Diagnosis present

## 2022-03-14 DIAGNOSIS — Z923 Personal history of irradiation: Secondary | ICD-10-CM

## 2022-03-14 DIAGNOSIS — T451X5A Adverse effect of antineoplastic and immunosuppressive drugs, initial encounter: Secondary | ICD-10-CM | POA: Diagnosis present

## 2022-03-14 DIAGNOSIS — Z87891 Personal history of nicotine dependence: Secondary | ICD-10-CM

## 2022-03-14 DIAGNOSIS — Z7901 Long term (current) use of anticoagulants: Secondary | ICD-10-CM

## 2022-03-14 DIAGNOSIS — D649 Anemia, unspecified: Secondary | ICD-10-CM | POA: Diagnosis present

## 2022-03-14 DIAGNOSIS — E7800 Pure hypercholesterolemia, unspecified: Secondary | ICD-10-CM | POA: Diagnosis present

## 2022-03-14 DIAGNOSIS — N179 Acute kidney failure, unspecified: Secondary | ICD-10-CM | POA: Diagnosis not present

## 2022-03-14 DIAGNOSIS — I252 Old myocardial infarction: Secondary | ICD-10-CM

## 2022-03-14 DIAGNOSIS — Z7189 Other specified counseling: Secondary | ICD-10-CM | POA: Diagnosis not present

## 2022-03-14 DIAGNOSIS — C7A1 Malignant poorly differentiated neuroendocrine tumors: Principal | ICD-10-CM | POA: Diagnosis present

## 2022-03-14 DIAGNOSIS — Z85828 Personal history of other malignant neoplasm of skin: Secondary | ICD-10-CM

## 2022-03-14 DIAGNOSIS — D61818 Other pancytopenia: Secondary | ICD-10-CM | POA: Diagnosis present

## 2022-03-14 DIAGNOSIS — F32A Depression, unspecified: Secondary | ICD-10-CM | POA: Diagnosis present

## 2022-03-14 DIAGNOSIS — G936 Cerebral edema: Secondary | ICD-10-CM | POA: Diagnosis present

## 2022-03-14 DIAGNOSIS — Z79899 Other long term (current) drug therapy: Secondary | ICD-10-CM

## 2022-03-14 DIAGNOSIS — R4182 Altered mental status, unspecified: Principal | ICD-10-CM

## 2022-03-14 DIAGNOSIS — Z801 Family history of malignant neoplasm of trachea, bronchus and lung: Secondary | ICD-10-CM

## 2022-03-14 DIAGNOSIS — Z66 Do not resuscitate: Secondary | ICD-10-CM | POA: Diagnosis not present

## 2022-03-14 DIAGNOSIS — I959 Hypotension, unspecified: Secondary | ICD-10-CM | POA: Diagnosis present

## 2022-03-14 DIAGNOSIS — Z981 Arthrodesis status: Secondary | ICD-10-CM

## 2022-03-14 DIAGNOSIS — I2510 Atherosclerotic heart disease of native coronary artery without angina pectoris: Secondary | ICD-10-CM | POA: Diagnosis present

## 2022-03-14 DIAGNOSIS — Z8249 Family history of ischemic heart disease and other diseases of the circulatory system: Secondary | ICD-10-CM

## 2022-03-14 DIAGNOSIS — Z823 Family history of stroke: Secondary | ICD-10-CM

## 2022-03-14 DIAGNOSIS — Z8673 Personal history of transient ischemic attack (TIA), and cerebral infarction without residual deficits: Secondary | ICD-10-CM

## 2022-03-14 DIAGNOSIS — R52 Pain, unspecified: Secondary | ICD-10-CM | POA: Diagnosis not present

## 2022-03-14 LAB — CBC WITH DIFFERENTIAL/PLATELET
Abs Immature Granulocytes: 0.02 10*3/uL (ref 0.00–0.07)
Basophils Absolute: 0 10*3/uL (ref 0.0–0.1)
Basophils Relative: 0 %
Eosinophils Absolute: 0 10*3/uL (ref 0.0–0.5)
Eosinophils Relative: 0 %
HCT: 28.9 % — ABNORMAL LOW (ref 39.0–52.0)
Hemoglobin: 9.6 g/dL — ABNORMAL LOW (ref 13.0–17.0)
Immature Granulocytes: 1 %
Lymphocytes Relative: 17 %
Lymphs Abs: 0.6 10*3/uL — ABNORMAL LOW (ref 0.7–4.0)
MCH: 33.4 pg (ref 26.0–34.0)
MCHC: 33.2 g/dL (ref 30.0–36.0)
MCV: 100.7 fL — ABNORMAL HIGH (ref 80.0–100.0)
Monocytes Absolute: 0.7 10*3/uL (ref 0.1–1.0)
Monocytes Relative: 17 %
Neutro Abs: 2.5 10*3/uL (ref 1.7–7.7)
Neutrophils Relative %: 65 %
Platelets: 213 10*3/uL (ref 150–400)
RBC: 2.87 MIL/uL — ABNORMAL LOW (ref 4.22–5.81)
RDW: 17.8 % — ABNORMAL HIGH (ref 11.5–15.5)
WBC: 3.8 10*3/uL — ABNORMAL LOW (ref 4.0–10.5)
nRBC: 0 % (ref 0.0–0.2)

## 2022-03-14 LAB — LACTIC ACID, PLASMA
Lactic Acid, Venous: 3.5 mmol/L (ref 0.5–1.9)
Lactic Acid, Venous: 3.3 mmol/L (ref 0.5–1.9)

## 2022-03-14 LAB — URINALYSIS, ROUTINE W REFLEX MICROSCOPIC
Bilirubin Urine: NEGATIVE
Glucose, UA: NEGATIVE mg/dL
Hgb urine dipstick: NEGATIVE
Ketones, ur: 80 mg/dL — AB
Leukocytes,Ua: NEGATIVE
Nitrite: NEGATIVE
Protein, ur: NEGATIVE mg/dL
Specific Gravity, Urine: 1.015 (ref 1.005–1.030)
pH: 7.5 (ref 5.0–8.0)

## 2022-03-14 LAB — RAPID URINE DRUG SCREEN, HOSP PERFORMED
Amphetamines: NOT DETECTED
Barbiturates: NOT DETECTED
Benzodiazepines: NOT DETECTED
Cocaine: NOT DETECTED
Opiates: POSITIVE — AB
Tetrahydrocannabinol: NOT DETECTED

## 2022-03-14 LAB — COMPREHENSIVE METABOLIC PANEL
ALT: 52 U/L — ABNORMAL HIGH (ref 0–44)
AST: 50 U/L — ABNORMAL HIGH (ref 15–41)
Albumin: 3.4 g/dL — ABNORMAL LOW (ref 3.5–5.0)
Alkaline Phosphatase: 106 U/L (ref 38–126)
Anion gap: 15 (ref 5–15)
BUN: 13 mg/dL (ref 8–23)
CO2: 20 mmol/L — ABNORMAL LOW (ref 22–32)
Calcium: 9.8 mg/dL (ref 8.9–10.3)
Chloride: 101 mmol/L (ref 98–111)
Creatinine, Ser: 1.11 mg/dL (ref 0.61–1.24)
GFR, Estimated: >60 mL/min (ref >60)
Glucose, Bld: 105 mg/dL — ABNORMAL HIGH (ref 70–99)
Potassium: 3.5 mmol/L (ref 3.5–5.1)
Sodium: 136 mmol/L (ref 135–145)
Total Bilirubin: 1 mg/dL (ref 0.3–1.2)
Total Protein: 8.1 g/dL (ref 6.5–8.1)

## 2022-03-14 LAB — PROTIME-INR
INR: 1.8 — ABNORMAL HIGH (ref 0.8–1.2)
Prothrombin Time: 20.6 seconds — ABNORMAL HIGH (ref 11.4–15.2)

## 2022-03-14 LAB — SARS CORONAVIRUS 2 BY RT PCR: SARS Coronavirus 2 by RT PCR: NEGATIVE

## 2022-03-14 IMAGING — CT CT RENAL STONE PROTOCOL
2 of 4 series · 16 of 46 positions shown, 18 images · non-contrast
Comparison: None.

CLINICAL DATA: Right-sided flank pain and hematuria

EXAM:
CT ABDOMEN AND PELVIS WITHOUT CONTRAST
TECHNIQUE: Multidetector CT imaging of the abdomen and pelvis was performed
following the standard protocol without IV contrast.

[Series 2: axial st · axial · 0.81mm/px · z∈[-516,-92]mm · 13 of 93 slices shown, 15 images]
[im 4/93  soft-tissue]
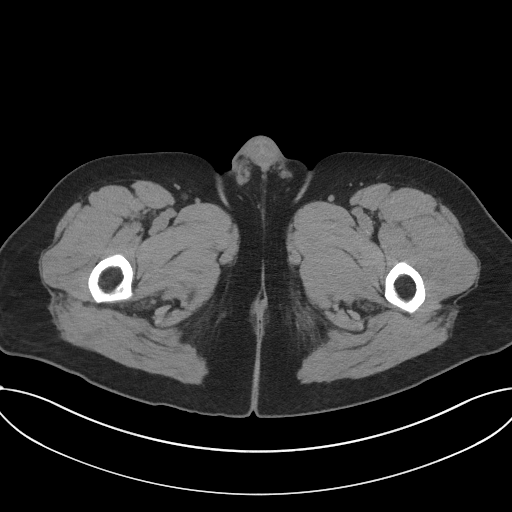
[im 4/93  bone]
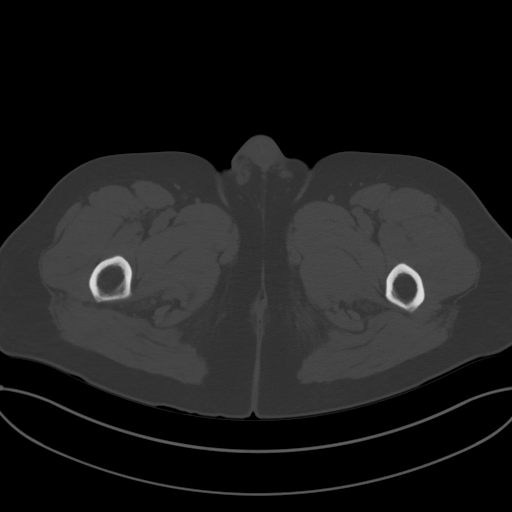
[im 12/93  soft-tissue]
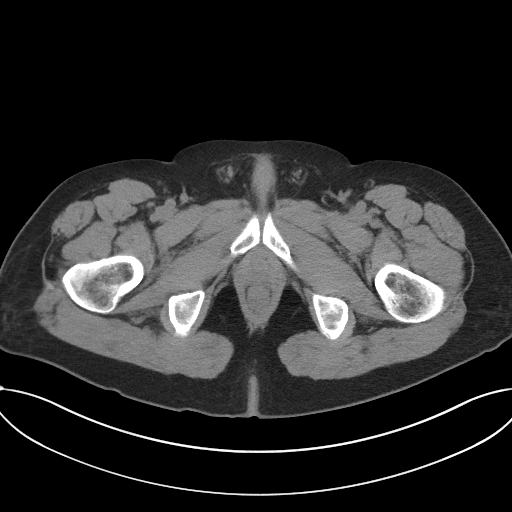
[im 20/93  soft-tissue]
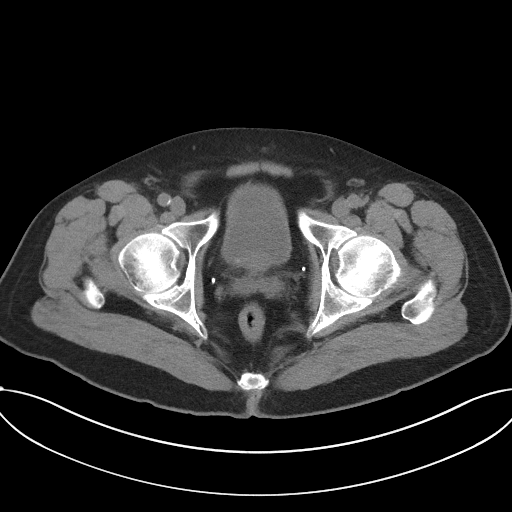
[im 27/93  soft-tissue]
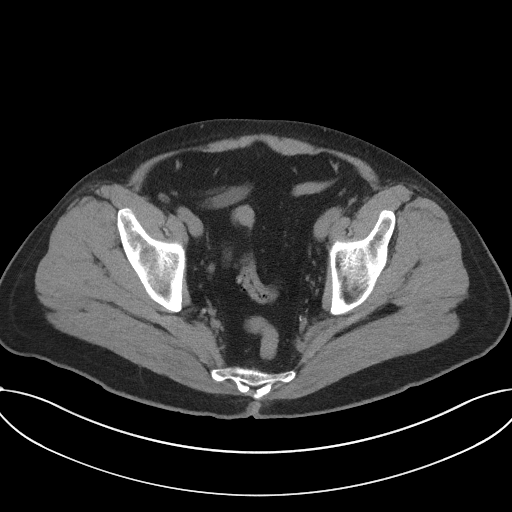
[im 31/93  soft-tissue]
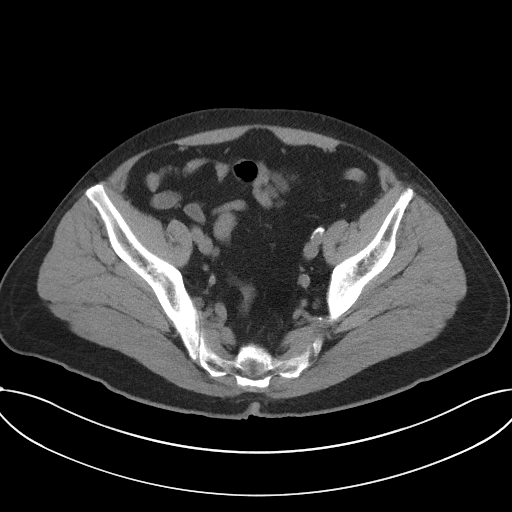
[im 39/93  soft-tissue]
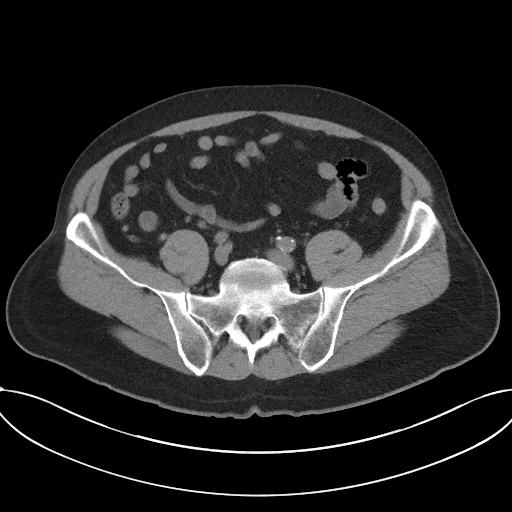
[im 47/93  soft-tissue]
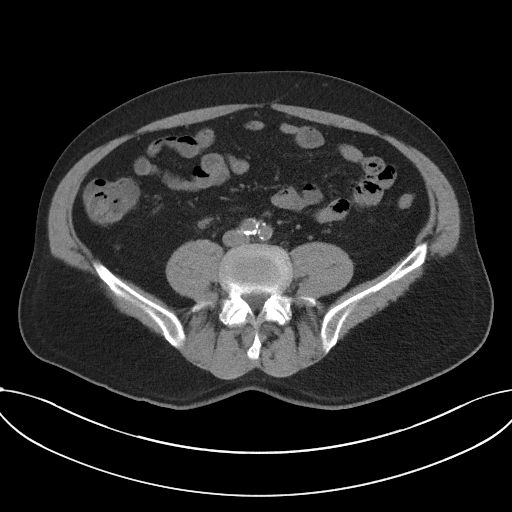
[im 54/93  soft-tissue]
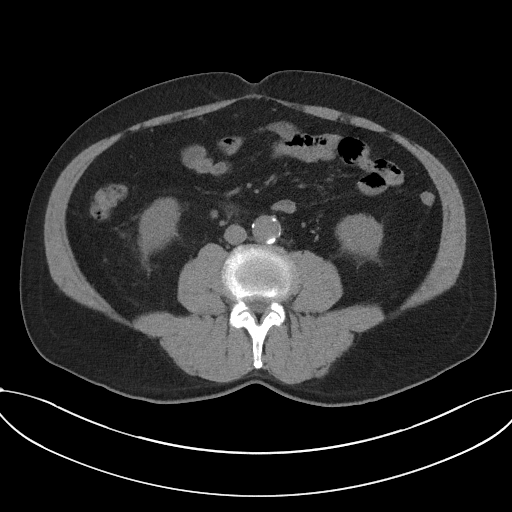
[im 62/93  soft-tissue]
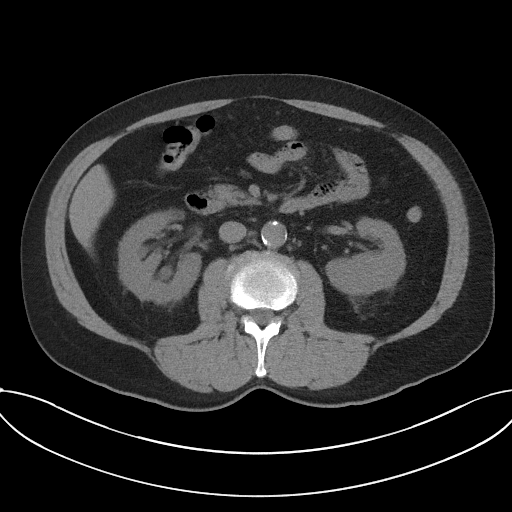
[im 62/93  bone]
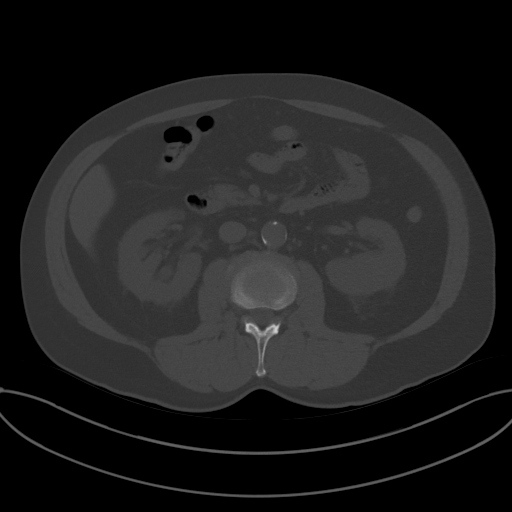
[im 66/93  soft-tissue]
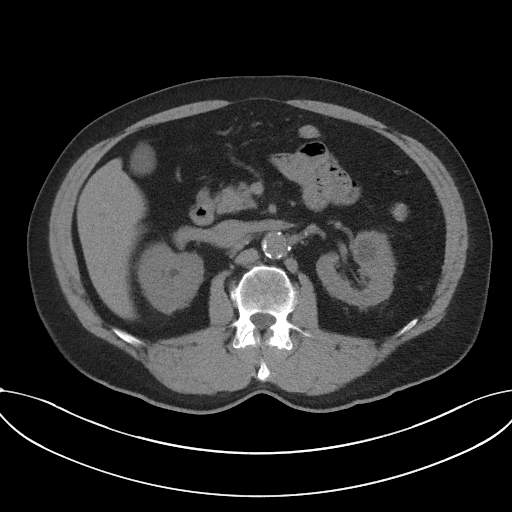
[im 73/93  soft-tissue]
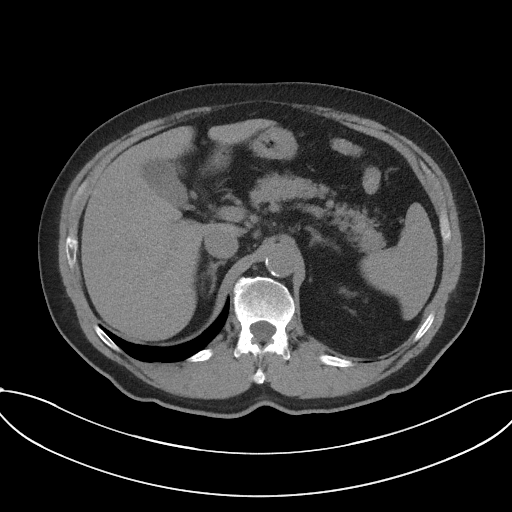
[im 81/93  soft-tissue]
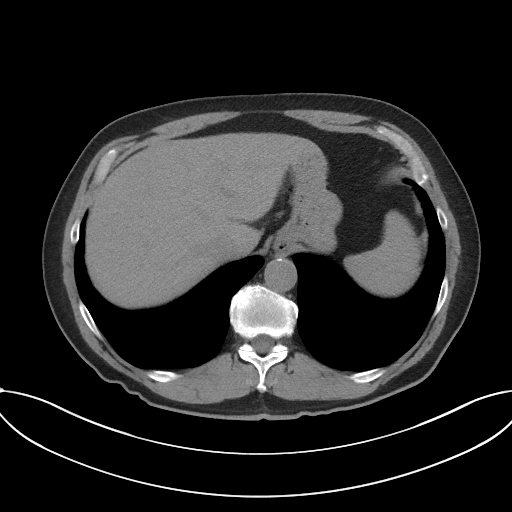
[im 89/93  soft-tissue]
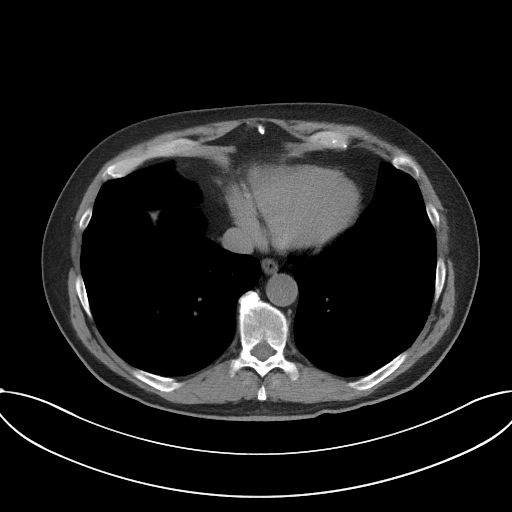

[Series 5: coronal st · coronal · 0.76mm/px · 3 of 95 slices shown]
[im 32/95  soft-tissue]
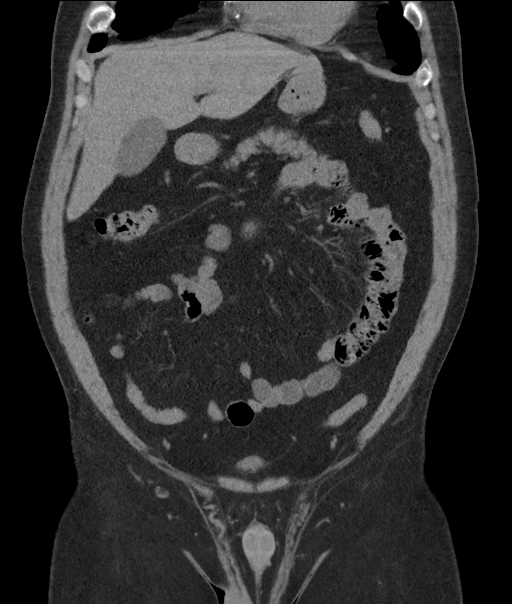
[im 42/95  soft-tissue]
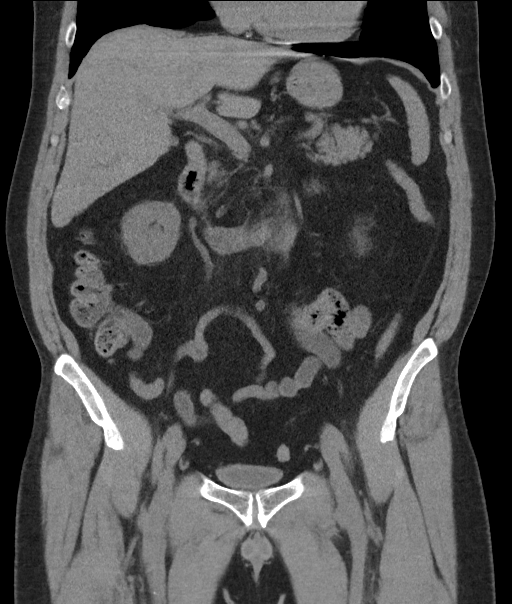
[im 53/95  soft-tissue]
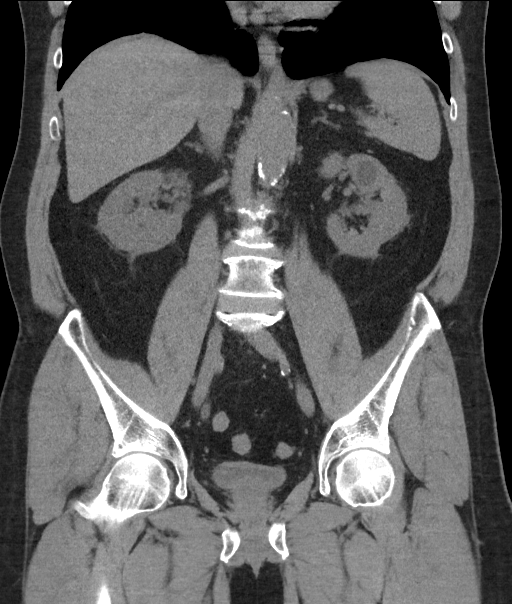

[16 of 46 positions shown; findings below may reference images not displayed]

FINDINGS: Lower chest: The visualized heart size within normal limits. No
pericardial fluid/thickening.

No hiatal hernia.

The visualized portions of the lungs are clear.

Hepatobiliary: Although limited due to the lack of intravenous
contrast, normal in appearance without gross focal abnormality. No
evidence of calcified gallstones or biliary ductal dilatation.

Pancreas:  Unremarkable.  No surrounding inflammatory changes.

Spleen: Normal in size. Although limited due to the lack of
intravenous contrast, normal in appearance.

Adrenals/Urinary Tract: Both adrenal glands appear normal. Mild
right pelvicaliectasis and ureterectasis is seen down to the level
of the UVJ. In the posterior right bladder there is a 4 mm calculus
present. Mild right-sided perinephric and periureteral stranding is
seen. There is punctate calcifications in the upper pole of the
right kidney. There is a 1.8 cm low-density lesion within the lower
pole the left kidney, likely renal cyst.

Stomach/Bowel: The stomach, small bowel, and colon are normal in
appearance. No inflammatory changes or obstructive findings.
appendix is normal. Scattered colonic diverticula are noted.

Vascular/Lymphatic: There are no enlarged abdominal or pelvic lymph
nodes. Scattered aortic atherosclerotic calcifications are seen
without aneurysmal dilatation.

Reproductive: The prostate is unremarkable.

Other: No evidence of abdominal wall mass or hernia.

Musculoskeletal: No acute or significant osseous findings.
IMPRESSION: Mild right hydronephrosis with perinephric stranding likely from
recently passed stone with a 4 mm posterior right bladder calculus.

Punctate nonobstructing right renal calculus

Diverticulosis without diverticulitis.

Aortic Atherosclerosis ([id number]).

## 2022-03-14 MED ORDER — ORAL CARE MOUTH RINSE: 15.0000 mL | OROMUCOSAL | Status: DC | PRN

## 2022-03-14 MED ORDER — SODIUM CHLORIDE 0.9 % IV SOLN
2.0000 g | Freq: Three times a day (TID) | INTRAVENOUS | Status: DC
  Administered 2022-03-15 – 2022-03-16 (×4): 2 g via INTRAVENOUS
  Filled 2022-03-14 (×5): qty 12.5

## 2022-03-14 MED ORDER — PIPERACILLIN-TAZOBACTAM 3.375 G IVPB: 3.3750 g | Freq: Three times a day (TID) | INTRAVENOUS | Status: DC

## 2022-03-14 MED ORDER — VANCOMYCIN HCL IN DEXTROSE 1-5 GM/200ML-% IV SOLN
1000.0000 mg | Freq: Once | INTRAVENOUS | Status: AC
  Administered 2022-03-14: 1000 mg via INTRAVENOUS
  Filled 2022-03-14: qty 200

## 2022-03-14 MED ORDER — SODIUM CHLORIDE 0.9 % IV BOLUS
1000.0000 mL | Freq: Once | INTRAVENOUS | Status: AC
  Administered 2022-03-14: 1000 mL via INTRAVENOUS

## 2022-03-14 MED ORDER — PIPERACILLIN-TAZOBACTAM 3.375 G IVPB 30 MIN
3.3750 g | Freq: Once | INTRAVENOUS | Status: AC
Start: 2022-03-14 — End: 2022-03-14
  Administered 2022-03-14: 3.375 g via INTRAVENOUS
  Filled 2022-03-14: qty 50

## 2022-03-14 MED ORDER — LACTATED RINGERS IV SOLN: INTRAVENOUS | Status: DC

## 2022-03-14 MED ORDER — VANCOMYCIN HCL 1500 MG/300ML IV SOLN
1500.0000 mg | INTRAVENOUS | Status: DC
  Filled 2022-03-14: qty 300

## 2022-03-14 MED ORDER — ONDANSETRON HCL 4 MG/2ML IJ SOLN
4.0000 mg | Freq: Once | INTRAMUSCULAR | Status: AC
  Administered 2022-03-14: 4 mg via INTRAVENOUS
  Filled 2022-03-14: qty 2

## 2022-03-14 MED ORDER — ENOXAPARIN SODIUM 40 MG/0.4ML IJ SOSY
40.0000 mg | PREFILLED_SYRINGE | INTRAMUSCULAR | Status: DC
  Administered 2022-03-14: 40 mg via SUBCUTANEOUS
  Filled 2022-03-14: qty 0.4

## 2022-03-14 MED ORDER — LACTATED RINGERS IV BOLUS
1000.0000 mL | Freq: Once | INTRAVENOUS | Status: AC
  Administered 2022-03-14: 1000 mL via INTRAVENOUS

## 2022-03-14 MED ORDER — IOHEXOL 300 MG/ML  SOLN
100.0000 mL | Freq: Once | INTRAMUSCULAR | Status: AC | PRN
  Administered 2022-03-14: 100 mL via INTRAVENOUS

## 2022-03-14 NOTE — ED Notes (Signed)
X-ray at bedside

## 2022-03-14 NOTE — H&P (Incomplete)
History and Physical    Patient: [name] YHC:[id number] DOB: 12-09-1955 DOA: 03/14/2022 DOS: the patient was seen and examined on 03/14/2022 PCP: [name], MD  Patient coming from: [facility]  Chief Complaint:  Chief Complaint  Patient presents with  . Altered Mental Status   HPI: KHANG HANNUM is a 66 y.o. 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  Daughter provides hx over the phone as pt is completely disoriented. She just moved in with him this week and was planning on help more with his care.   About 3 days ago he ran out of Dilaudid briefly.  He then had symptoms of shivering, nausea and diarrhea.  Decrease fluid intake.  Then today around noon he had an acute change in his mental status and became completely disoriented.  He normally is alert and oriented x4 at baseline.  Daughter decided to count his Dilaudid pills and noticed he had only taken 2 pills in the past 72 hours although it is prescribed for 3 times daily PRN. She immediately gave him 1 Dilaudid prior to coming to ED.  She reports that he has been dealing with on and off opioid use for decades due to issues with pain. He also has MS contin 60mg  q12hr and ativan 1mg  q8hr on his list. She is unsure how or if he has been taking these. She is also concerned about stroke since he hx of TIA but no focal weakness that she has noticed. Unsure if he took Xarelto for his atrial fibrillation.   In the ED, he was afebrile tachycardic tachypneic and normotensive on room air. Has leukopenia and elevated lactate of 3.3. Creatinine was elevated 1.11 from prior of 0.97. UA was negative. UDS positive for opioids.  CT  head was negative with stable known brain lesion. CT abdomen pelvis showed worsening metastatic disease in the lungs and increased streaky and confluent opacity in the right lower lobe that could be atelectasis, tumor versus infection.  No obvious source of infection was found but given SIRS criteria.  He was started on Zosyn, vancomycin and cefepime by ED PA. Hospitalist was then consulted for transfer for further management. Review of Systems: unable to review all systems due to the inability of the patient to answer questions. Past Medical History:  Diagnosis Date  . Anemia   . Anxiety   . Arthritis   . Basal cell carcinoma (BCC) of forehead   . CAD (coronary artery disease)    a. 10/2015 ant STEMI >> LHC with 3 v CAD; oLAD tx with POBA >> emergent CABG. b. Multiple evals since that time, early graft failure of SVG-RCA by cath 03/2016. c. 2/19 PCI/DES x1 to pRCA, normal EF.  . Carotid artery disease ([place])    a. 40-59% BICA 02/2018.  Marland Kitchen Depression   . Dyspnea   . Ectopic atrial tachycardia ([place])   . Esophageal reflux    eosinophil esophagitis  . Family history of adverse reaction to anesthesia    "sister has PONV" (06/21/2017)  . Former tobacco use   . Gout   . Hepatitis C    "treated and cured" (06/21/2017)  . High cholesterol   . History of blood transfusion   . History  of kidney stones   . Hypertension   . Ischemic cardiomyopathy    a. EF 25-30% at intraop TEE 4/17  //  b. Limited Echo 5/17 - EF 45-50%, mild ant HK. c. EF 55-65% by cath 09/2017.  . 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[place])   . Migraine    "3-4/yr" (06/21/2017)  . Myocardial infarction ([place]) 10/2015  . Palpitations   . Pneumonia   . Sinus bradycardia    a. HR dropping into 40s in 02/2016 -> BB reduced.  . Stroke ([place]) 10/2016   "small one; sometimes my memory/cognitive issues" (06/21/2017)  . Symptomatic hypotension    a. 02/2016 ER visit -> meds reduced.  . Syncope   . Wears dentures   . Wears glasses     Past Surgical History:  Procedure Laterality Date  . [place] VITRECTOMY WITH 20 GAUGE MVR PORT  12/31/2020   Procedure: 25 GAUGE PARS PLANA VITRECTOMY WITH 20 GAUGE MVR PORT;  Surgeon: [name], MD;  Location: [facility];  Service: Thoracic;;  . ANTERIOR CERVICAL DECOMP/DISCECTOMY FUSION N/A 10/17/2018   Procedure: Anterior Cervical Decompression Fusion - Cervical seven -Thoracic one;  Surgeon: Consuella Lose, MD;  Location: [facility];  Service: Neurosurgery;  Laterality: N/A;  . BASAL CELL CARCINOMA EXCISION     "forehead  . BIOPSY  07/20/2019   Procedure: BIOPSY;  Surgeon: Carol Ada, MD;  Location: [facility];  Service: Endoscopy;;  . BIOPSY OF MEDIASTINAL MASS Right 07/29/2021   Procedure: RIGHT CHEST WALL MASS BIOPSY;  Surgeon: [name], MD;  Location: [facility];  Service: Thoracic;  Laterality: Right;  . BRONCHIAL BIOPSY  12/24/2020   Procedure: BRONCHIAL BIOPSIES;  Surgeon: Garner Nash, DO;  Location: [facility];  Service: Pulmonary;;  . BRONCHIAL BRUSHINGS  12/24/2020   Procedure: BRONCHIAL BRUSHINGS;  Surgeon: Garner Nash, DO;  Location: [facility];  Service: Pulmonary;;  . BRONCHIAL NEEDLE ASPIRATION BIOPSY  12/24/2020   Procedure: BRONCHIAL NEEDLE ASPIRATION BIOPSIES;  Surgeon: Garner Nash, DO;  Location: [facility];  Service: Pulmonary;;  . CARDIAC CATHETERIZATION N/A 11/28/2015   Procedure: Left Heart Cath and Coronary Angiography;  Surgeon: [name], MD;  Location: [facility];  Service: Cardiovascular;  Laterality: N/A;  . CARDIAC CATHETERIZATION N/A 11/28/2015   Procedure: Coronary Balloon Angioplasty;  Surgeon: [name], MD;  Location: [facility];  Service: Cardiovascular;  Laterality: N/A;  ostial LAD  . CARDIAC CATHETERIZATION N/A 11/28/2015   Procedure: Coronary/Graft Angiography;  Surgeon: [name], MD;  Location: [facility];  Service: Cardiovascular;  Laterality: N/A;   coronaries only   . CARDIAC CATHETERIZATION N/A 04/21/2016   Procedure: Left Heart Cath and Coronary Angiography;  Surgeon: Wellington Hampshire, MD;  Location: [facility];  Service: Cardiovascular;  Laterality: N/A;  . CARDIAC CATHETERIZATION N/A 06/14/2016   Procedure: Left Heart Cath and Cors/Grafts Angiography;  Surgeon: Lorretta Harp, MD;  Location: [facility] [facility];  Service: Cardiovascular;  Laterality: N/A;  . CARDIAC CATHETERIZATION N/A 09/08/2016   Procedure: Left Heart Cath and Cors/Grafts Angiography;  Surgeon: Wellington Hampshire, MD;  Location: [place] [facility];  Service: Cardiovascular;  Laterality: N/A;  . CARDIAC CATHETERIZATION    . CORONARY ARTERY BYPASS GRAFT N/A 11/28/2015   Procedure: CORONARY ARTERY BYPASS GRAFTING (CABG) TIMES FIVE USING LEFT INTERNAL MAMMARY ARTERY AND RIGHT GREATER SAPHENOUS,VIEN HARVEATED BY ENDOVIEN, INTRAOPPRATIVE TEE;  Surgeon: Gaye Pollack, MD;  Location:  [facility] OR;  Service: Open Heart Surgery;  Laterality: N/A;  . CORONARY STENT INTERVENTION N/A 10/05/2017   Procedure: CORONARY STENT INTERVENTION;  Surgeon: [name], MD;  Location: [place] [facility];  Service: Cardiovascular;  Laterality: N/A;  . ESOPHAGOGASTRODUODENOSCOPY (EGD) WITH PROPOFOL N/A 07/20/2019   Procedure: ESOPHAGOGASTRODUODENOSCOPY (EGD) WITH PROPOFOL;  Surgeon: Carol Ada, MD;  [facility];  Service: Endoscopy;  Laterality: N/A;  . ESOPHAGOGASTRODUODENOSCOPY (EGD) WITH PROPOFOL N/A 01/30/2021   Procedure: ESOPHAGOGASTRODUODENOSCOPY (EGD) WITH PROPOFOL;  Surgeon: Carol Ada, MD;  [facility];  Service: Endoscopy;  Laterality: N/A;  . FIDUCIAL MARKER PLACEMENT  12/24/2020   Procedure: FIDUCIAL DYE MARKER PLACEMENT;  Surgeon: Garner Nash, DO;  Location: [facility];  Service: Pulmonary;;  . HUMERUS SURGERY Right 1969   "tumor inside bone; filled it w/bone chips"  . INTERCOSTAL NERVE BLOCK Right 12/24/2020   Procedure: INTERCOSTAL NERVE  BLOCK;  Surgeon: [name], MD;  Location: [facility];  Service: Thoracic;  Laterality: Right;  . IR IMAGING GUIDED PORT INSERTION  02/06/2021  . LEFT HEART CATH AND CORS/GRAFTS ANGIOGRAPHY N/A 03/11/2017   Procedure: Left Heart Cath and Cors/Grafts Angiography;  Surgeon: Leonie Man, MD;  Location: [facility];  Service: Cardiovascular;  Laterality: N/A;  . LEFT HEART CATH AND CORS/GRAFTS ANGIOGRAPHY N/A 10/05/2017   Procedure: LEFT HEART CATH AND CORS/GRAFTS ANGIOGRAPHY;  Surgeon: [name], MD;  Location: [place] [facility];  Service: Cardiovascular;  Laterality: N/A;  . LEFT HEART CATH AND CORS/GRAFTS ANGIOGRAPHY N/A 04/11/2019   Procedure: LEFT HEART CATH AND CORS/GRAFTS ANGIOGRAPHY;  Surgeon: [name], MD;  Location: [facility];  Service: Cardiovascular;  Laterality: N/A;  . NODE DISSECTION Right 12/24/2020   Procedure: NODE DISSECTION;  Surgeon: [name], MD;  Location: [facility];  Service: Thoracic;  Laterality: Right;  . PERIPHERAL VASCULAR CATHETERIZATION N/A 06/14/2016   Procedure: Lower Extremity Angiography;  Surgeon: Lorretta Harp, MD;  Location: [facility];  Service: Cardiovascular;  Laterality: N/A;  . VIDEO BRONCHOSCOPY WITH ENDOBRONCHIAL NAVIGATION Right 12/24/2020   Procedure: VIDEO BRONCHOSCOPY WITH ENDOBRONCHIAL NAVIGATION;  Surgeon: Garner Nash, DO;  Location: [facility];  Service: Pulmonary;  Laterality: Right;  Marland Kitchen VIDEO BRONCHOSCOPY WITH ENDOBRONCHIAL ULTRASOUND N/A 12/24/2020   Procedure: VIDEO BRONCHOSCOPY WITH ENDOBRONCHIAL ULTRASOUND;  Surgeon: Garner Nash, DO;  Location: [facility];  Service: Pulmonary;  Laterality: N/A;  . VIDEO BRONCHOSCOPY WITH INSERTION OF INTERBRONCHIAL VALVE (IBV) N/A 12/31/2020   Procedure: VIDEO BRONCHOSCOPY WITH INSERTION OF INTERBRONCHIAL VALVE (IBV).VALVE IN CARTRIDGE 73mm,9mm. CHEST TUBE PLACEMENT.;  Surgeon: [name], MD;  [facility];  Service: Thoracic;   Laterality: N/A;  . VIDEO BRONCHOSCOPY WITH INSERTION OF INTERBRONCHIAL VALVE (IBV) N/A 07/29/2021   Procedure: VIDEO BRONCHOSCOPY WITH REMOVAL OF INTERBRONCHIAL VALVE (IBV);  Surgeon: [name], MD;  Location: [facility];  Service: Thoracic;  Laterality: N/A;   Social History:  reports that he quit smoking about 6 years ago. His smoking use included cigarettes. He has a 33.00 pack-year smoking history. He has never used smokeless tobacco. He reports that he does not currently use alcohol. He reports that he does not use drugs.  Allergies  Allergen Reactions  . Prednisone Other (See Comments)    States that this med makes him "crazy"  . Tetanus Toxoids Swelling and Other (See Comments)    Fever, Swelling of the arm   . Wellbutrin [Bupropion] Other (See Comments)  Crazy thoughts, nightmares  . Varenicline Other (See Comments)    Unpleasant dreams    Family History  Problem Relation Age of Onset  . Lung cancer Mother   . Heart Problems Father   . Heart attack Father 37  . Stroke Father   . Heart failure Father   . Heart attack Maternal Grandmother   . Stroke Maternal Grandmother   . Heart attack Paternal Uncle   . Hypertension Brother   . Autoimmune disease Neg Hx     Prior to Admission medications   Medication Sig Start Date End Date Taking? Authorizing Provider  acetaminophen (TYLENOL) 500 MG tablet Take 1,000 mg by mouth every 6 (six) hours as needed for mild pain, fever or headache.    [provider]  aspirin EC 81 MG tablet Take 81 mg by mouth daily. Swallow whole. Patient not taking: Reported on 03/09/2022    [provider]  benzonatate (TESSALON) 100 MG capsule TAKE 1 CAPSULE(100 MG) BY MOUTH THREE TIMES DAILY AS NEEDED FOR COUGH 03/01/22   Heilingoetter, Cassandra L, PA-C  dexamethasone (DECADRON) 4 MG tablet Take 1 tablet (4 mg total) by mouth daily. 03/05/22   Kyung Rudd, MD  diclofenac (VOLTAREN) 25 MG EC tablet Take by mouth. Patient not  taking: Reported on 03/09/2022 11/01/21   [provider]  DULoxetine (CYMBALTA) 30 MG capsule Take 1 capsule (30 mg total) by mouth 2 (two) times daily. 02/02/22   Pickenpack-Cousar, Carlena Sax, NP  folic acid (FOLVITE) 1 MG tablet Take 1 tablet (1 mg total) by mouth daily. 09/17/21   Curt Bears, MD  HYDROmorphone (DILAUDID) 8 MG tablet Take 1 tablet by mouth every 6 hours as needed for severe pain. 03/12/22   Pickenpack-Cousar, Carlena Sax, NP  lidocaine-prilocaine (EMLA) cream Apply to the Port-A-Cath site 30-60-minute before treatment 11/09/21   Curt Bears, MD  LORazepam (ATIVAN) 1 MG tablet Take 1 tablet (1 mg total) by mouth every 8 (eight) hours as needed for anxiety. 02/25/22   Pickenpack-Cousar, Carlena Sax, NP  magnesium oxide (MAG-OX) 400 (240 Mg) MG tablet Take 1 tablet by mouth daily. 12/28/21   [provider]  metoprolol succinate (TOPROL-XL) 25 MG 24 hr tablet Take 0.5 tablets (12.5 mg total) by mouth daily. 03/09/22   Elgie Collard, PA-C  morphine (MS CONTIN) 60 MG 12 hr tablet Take 1 tablet by mouth every 12  hours. 03/19/22   Pickenpack-Cousar, Carlena Sax, NP  ondansetron (ZOFRAN) 8 MG tablet Take 1 tablet by mouth every 8 hours as needed for nausea or vomiting. Starting 3 days after chemotherapy 01/11/22   Pickenpack-Cousar, Carlena Sax, NP  polyethylene glycol (MIRALAX / GLYCOLAX) 17 g packet Take 17 g by mouth daily. 09/04/21   Mariel Aloe, MD  prochlorperazine (COMPAZINE) 10 MG tablet Take 1 tablet (10 mg total) by mouth every 6 (six) hours as needed. 02/02/22   Pickenpack-Cousar, Carlena Sax, NP  senna-docusate (SENOKOT-S) 8.6-50 MG tablet Take 1 tablet by mouth 2 (two) times daily. 10/06/21   Eugenie Filler, MD  tiZANidine (ZANAFLEX) 2 MG tablet Take 1 tablet (2 mg total) by mouth at bedtime as needed for muscle spasms. 02/02/22   Pickenpack-Cousar, Carlena Sax, NP  XARELTO 20 MG TABS tablet Take 20 mg by mouth every evening. 06/01/21   [provider]    Physical  Exam: Vitals:   03/14/22 1900 03/14/22 1921 03/14/22 1954 03/14/22 2107  BP: (!) 104/53 (!) 92/56 (!) 109/57 118/65  Pulse: 92 75  64 91  Resp: (!) 25 (!) 21 (!) 23 20  Temp:  99.3 F (37.4 C)  98.5 F (36.9 C)  TempSrc:  Oral  Oral  SpO2: 97% 95% 97% 100%  Weight:      Height:       *** Data Reviewed: {Tip this will not be part of the note when signed- Document your independent interpretation of telemetry tracing, EKG, lab, Radiology test or any other diagnostic tests. Add any new diagnostic test ordered today. (Optional):26781} {Results:26384}  Assessment and Plan: No notes have been filed under this hospital service. Service: Hospitalist     Advance Care Planning:   Code Status: DNR ***  Consults: ***  Family Communication: ***  Severity of Illness: {Observation/Inpatient:21159}  Author: Orene Desanctis, DO 03/14/2022 11:47 PM  For on call review [email or web address].

## 2022-03-14 NOTE — Progress Notes (Signed)
Pharmacy Antibiotic Note  [name] is a 66 y.o. male for which pharmacy has been consulted for cefepime and vancomycin dosing for sepsis.  Patient with a history of  stage IV lung cancer with mets to the brain . Patient presenting with AMS.  SCr 1.11 - near baseline WBC 3.8; LA 3.5>3.3; T 99.3 F; HR 100>64; RR 23  Plan: Cefepime 2g q8hr Vancomycin 1000 mg once then 1500 mg q24hr (eAUC 504.6) unless change in renal function Trend WBC, Fever, Renal function, & Clinical course F/u cultures, clinical course, WBC, fever De-escalate when able Levels at steady state  Height: 5\' 9"  (175.3 cm) Weight: 70.3 kg (155 lb) IBW/kg (Calculated) : 70.7  Temp (24hrs), Avg:98.3 F (36.8 C), Min:98.3 F (36.8 C), Max:98.3 F (36.8 C)  Recent Labs  Lab 03/14/22 1448  WBC 3.8*  CREATININE 1.11  LATICACIDVEN 3.5*    Estimated Creatinine Clearance: 65.1 mL/min (by C-G formula based on SCr of 1.11 mg/dL).    Allergies  Allergen Reactions   Prednisone Other (See Comments)    States that this med makes him "crazy"   Tetanus Toxoids Swelling and Other (See Comments)    Fever, Swelling of the arm    Wellbutrin [Bupropion] Other (See Comments)    Crazy thoughts, nightmares   Varenicline Other (See Comments)    Unpleasant dreams    Antimicrobials this admission: Zosyn x 1 in ED 7/16 cefepime 7/16 >>  vancomycin 7/16 >>   Microbiology results: Pending  Thank you for allowing pharmacy to be a part of this patient's care.  Lorelei Pont, PharmD, BCPS 03/14/2022 4:15 PM ED Clinical Pharmacist -  [phone]

## 2022-03-14 NOTE — ED Notes (Signed)
Attempted to use bed pan, but nothing happened.

## 2022-03-14 NOTE — ED Provider Notes (Signed)
[facility] EMERGENCY DEPARTMENT Provider Note   CSN: [id number] Arrival date & time: 03/14/22  1414     History Chief Complaint  Patient presents with   Altered Mental Status    [name] is a 66 y.o. 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  Daughter at bedside provides most of the history.  She states that he has been on 3 Dilaudid tablets daily for at least a year.  Patient thought he would wean himself off and has 2 tablets since Friday.  Daughter states that he was having some flulike symptoms late Friday and into Saturday with a delirium and altered mental status starting today.  Patient keeps mentioning that he needs to go to the bathroom and when we handed him the urinal he says "I do not need to go to the bathroom I need to go to the bathroom."  Patient has no somatic complaints at this time.   Altered Mental Status      Home Medications Prior to Admission medications   Medication Sig Start Date End Date Taking? Authorizing Provider  acetaminophen (TYLENOL) 500 MG tablet Take 1,000 mg by mouth every 6 (six) hours as needed for mild pain, fever or headache.    [provider]  aspirin EC 81 MG tablet Take 81 mg by mouth daily. Swallow whole. Patient not taking: Reported on 03/09/2022    [provider]  benzonatate (TESSALON) 100 MG capsule TAKE 1 CAPSULE(100 MG) BY MOUTH THREE TIMES DAILY AS NEEDED FOR COUGH 03/01/22   Heilingoetter, Cassandra L, PA-C  dexamethasone (DECADRON) 4 MG tablet Take 1 tablet (4 mg total) by mouth daily. 03/05/22   Kyung Rudd, MD  diclofenac (VOLTAREN) 25 MG EC tablet Take by mouth. Patient not taking: Reported on 03/09/2022 11/01/21   [provider]  DULoxetine (CYMBALTA) 30 MG capsule Take 1 capsule (30 mg total) by mouth 2 (two) times daily. 02/02/22   Pickenpack-Cousar, Carlena Sax, NP  folic acid (FOLVITE) 1 MG tablet Take 1 tablet (1 mg total) by mouth daily. 09/17/21   Curt Bears, MD  HYDROmorphone (DILAUDID) 8 MG tablet Take 1 tablet by mouth every 6 hours as needed for severe pain. 03/12/22   Pickenpack-Cousar, Carlena Sax, NP  lidocaine-prilocaine (EMLA) cream Apply to the Port-A-Cath site 30-60-minute before treatment 11/09/21   Curt Bears, MD  LORazepam (ATIVAN) 1 MG tablet Take 1 tablet (1 mg total) by mouth every 8 (eight) hours as needed for anxiety. 02/25/22   Pickenpack-Cousar, Carlena Sax, NP  magnesium oxide (MAG-OX) 400 (240 Mg) MG tablet Take 1 tablet by mouth daily. 12/28/21   [provider]  metoprolol succinate (TOPROL-XL) 25 MG 24 hr tablet Take 0.5 tablets (12.5 mg total) by mouth daily. 03/09/22   Elgie Collard, PA-C  morphine (MS CONTIN) 60 MG 12 hr tablet Take 1 tablet by mouth every 12  hours. 03/19/22   Pickenpack-Cousar, Carlena Sax, NP  ondansetron (ZOFRAN) 8 MG tablet Take 1 tablet by mouth every 8 hours as needed for nausea or vomiting. Starting 3 days after chemotherapy 01/11/22   Pickenpack-Cousar, Carlena Sax, NP  polyethylene glycol (MIRALAX / GLYCOLAX) 17 g packet Take 17 g by mouth daily. 09/04/21   Mariel Aloe, MD  prochlorperazine (COMPAZINE) 10 MG tablet Take 1 tablet (10 mg total) by mouth every 6 (six) hours as  needed. 02/02/22   Pickenpack-Cousar, Carlena Sax, NP  senna-docusate (SENOKOT-S) 8.6-50 MG tablet Take 1 tablet by mouth 2 (two) times daily. 10/06/21   Eugenie Filler, MD  tiZANidine (ZANAFLEX) 2 MG tablet Take 1 tablet (2 mg total) by mouth at bedtime as needed for muscle spasms. 02/02/22   Pickenpack-Cousar, Carlena Sax, NP  XARELTO 20 MG TABS tablet Take 20 mg by mouth every evening. 06/01/21   [provider]      Allergies    Prednisone, Tetanus toxoids, Wellbutrin [bupropion], and Varenicline    Review of Systems   Review of Systems  All other systems reviewed and are negative.   Physical Exam Updated Vital  Signs BP (!) 122/56   Pulse 96   Temp 99.7 F (37.6 C) (Rectal)   Resp (!) 23   Ht 5\' 9"  (1.753 m)   Wt 70.3 kg   SpO2 99%   BMI 22.89 kg/m  Physical Exam Vitals and nursing note reviewed.  Constitutional:      General: He is not in acute distress.    Appearance: Normal appearance.     Comments: Confused.  HENT:     Head: Normocephalic and atraumatic.  Eyes:     General:        Right eye: No discharge.        Left eye: No discharge.  Cardiovascular:     Rate and Rhythm: Tachycardia present.     Comments: S1/S2 are distinct without any evidence of murmur, rubs, or gallops.  Radial pulses are 2+ bilaterally.  Dorsalis pedis pulses are 2+ bilaterally.  No evidence of pedal edema. Pulmonary:     Comments: Clear to auscultation bilaterally.  Normal effort.  No respiratory distress.  No evidence of wheezes, rales, or rhonchi heard throughout. Abdominal:     General: Abdomen is flat. Bowel sounds are normal. There is no distension.     Tenderness: There is generalized abdominal tenderness. There is no guarding or rebound.  Musculoskeletal:        General: Normal range of motion.     Cervical back: Neck supple.  Skin:    General: Skin is warm and dry.     Findings: No rash.  Neurological:     General: No focal deficit present.     Mental Status: He is alert. He is confused.     Comments: Patient disoriented to time, place, and situation.  Psychiatric:        Mood and Affect: Mood normal.        Behavior: Behavior normal. Behavior is cooperative.     ED Results / Procedures / Treatments   Labs (all labs ordered are listed, but only abnormal results are displayed) Labs Reviewed  COMPREHENSIVE METABOLIC PANEL - Abnormal; Notable for the following components:      Result Value   CO2 20 (*)    Glucose, Bld 105 (*)    Albumin 3.4 (*)    AST 50 (*)    ALT 52 (*)    All other components within normal limits  LACTIC ACID, PLASMA - Abnormal; Notable for the following  components:   Lactic Acid, Venous 3.5 (*)    All other components within normal limits  LACTIC ACID, PLASMA - Abnormal; Notable for the following components:   Lactic Acid, Venous 3.3 (*)    All other components within normal limits  CBC WITH DIFFERENTIAL/PLATELET - Abnormal; Notable for the following components:   WBC 3.8 (*)    RBC  2.87 (*)    Hemoglobin 9.6 (*)    HCT 28.9 (*)    MCV 100.7 (*)    RDW 17.8 (*)    Lymphs Abs 0.6 (*)    All other components within normal limits  PROTIME-INR - Abnormal; Notable for the following components:   Prothrombin Time 20.6 (*)    INR 1.8 (*)    All other components within normal limits  URINALYSIS, ROUTINE W REFLEX MICROSCOPIC - Abnormal; Notable for the following components:   Ketones, ur 80 (*)    All other components within normal limits  RAPID URINE DRUG SCREEN, HOSP PERFORMED - Abnormal; Notable for the following components:   Opiates POSITIVE (*)    All other components within normal limits  CULTURE, BLOOD (ROUTINE X 2)  CULTURE, BLOOD (ROUTINE X 2)  SARS CORONAVIRUS 2 BY RT PCR    EKG EKG Interpretation  Date/Time:  Sunday March 14 2022 14:34:04 EDT Ventricular Rate:  135 PR Interval:  122 QRS Duration: 64 QT Interval:  306 QTC Calculation: 459 R Axis:   81 Text Interpretation: Sinus tachycardia with Premature atrial complexes with Abberant conduction Possible Left atrial enlargement Low voltage QRS Nonspecific ST abnormality Abnormal ECG When compared with ECG of 10-Jan-2022 08:44, PREVIOUS ECG IS PRESENT increased rate and nonspecific changes from prior Confirmed by Aletta Edouard [phone]) on 03/14/2022 2:45:13 PM  Radiology CT CHEST ABDOMEN PELVIS W CONTRAST  Result Date: 03/14/2022 CLINICAL DATA:  66 year old male with acute chest, abdominal and pelvic pain. History of non-small cell lung cancer. EXAM: CT CHEST, ABDOMEN, AND PELVIS WITH CONTRAST TECHNIQUE: Multidetector CT imaging of the chest, abdomen and pelvis was  performed following the standard protocol during bolus administration of intravenous contrast. RADIATION DOSE REDUCTION: This exam was performed according to the departmental dose-optimization program which includes automated exposure control, adjustment of the mA and/or kV according to patient size and/or use of iterative reconstruction technique. CONTRAST:  157mL OMNIPAQUE IOHEXOL 300 MG/ML  SOLN COMPARISON:  12/17/2021 chest CT, 01/14/2022 abdomen and pelvis CT and other studies. FINDINGS: CT CHEST FINDINGS Cardiovascular: Heart size is normal. Heavy coronary artery atherosclerotic calcifications and evidence of CABG noted. Aortic atherosclerotic calcifications noted without evidence of thoracic aortic aneurysm. No pericardial effusion. Mediastinum/Nodes: Enlarging pretracheal, RIGHT paratracheal, RIGHT hilar and subcarinal lymph nodes are identified with 1.3 cm RIGHT paratracheal node (series 3: Image 12) previously measuring 1 cm, and 2 cm pretracheal node (3:21) previously measuring 1.2 cm. Visualized thyroid, trachea and esophagus are unremarkable. Lungs/Pleura: Innumerable pulmonary nodules within both lungs have increased in size and number compatible with metastatic disease. Index LEFT UPPER lobe nodule now measures 1.3 x 1.6 cm (4:55) previously 0.8 x 1.2 cm. Increasing streaky and confluent opacities within the RIGHT LOWER lobe noted. A small RIGHT basilar loculated effusion is again noted. There is no evidence of pneumothorax. Musculoskeletal: Irregularity of the posterior RIGHT 11th and 12th ribs are again noted. New irregularity/fracture of the posterior RIGHT 9th rib is present. CT ABDOMEN PELVIS FINDINGS Hepatobiliary: A few tiny low-density hypodense lesions within the liver are unchanged. No definite gallbladder abnormality identified. There is no evidence of intrahepatic or extrahepatic biliary dilatation. Pancreas: Unremarkable Spleen: Unremarkable Adrenals/Urinary Tract: The kidneys are  unremarkable except for a punctate nonobstructing RIGHT UPPER pole renal calculus and a stable LEFT renal cyst which needs no follow-up. The adrenal glands are unremarkable. No significant bladder abnormality identified. Stomach/Bowel: Fluid within the colon is noted and may represent a diarrheal state. No definite bowel wall  thickening is present. There is no evidence of bowel obstruction or pneumoperitoneum. Vascular/Lymphatic: A 1.5 cm aortocaval node (3:76) previously measured 1.1 cm in short axis. Other shotty retroperitoneal lymph nodes are identified. Aortic atherosclerotic calcifications noted without evidence of aneurysm. Reproductive: Prostate is unremarkable. Other: No ascites, focal collection or pneumoperitoneum. Musculoskeletal: No acute or suspicious bony abnormalities are noted. IMPRESSION: 1. Increasing metastatic disease with increasing size and number of pulmonary nodules, and enlarging mediastinal, RIGHT hilar and aorto caval lymph nodes. 2. Increasing streaky and confluent opacities within the RIGHT LOWER lobe which may represent atelectasis, tumor or infection. Unchanged small loculated RIGHT basilar pleural effusion. 3. Unchanged irregularity of the posterior RIGHT 11th and 12th ribs with new irregularity/fracture of the posterior RIGHT 9th rib. Question metastatic disease or postprocedural/posttraumatic changes. 4. Fluid within the colon which may represent a diarrheal state. 5. Aortic Atherosclerosis (ICD10-I70.0). Electronically Signed   By: Margarette Canada M.D.   On: 03/14/2022 16:34   CT Head Wo Contrast  Result Date: 03/14/2022 CLINICAL DATA:  Altered mental status. Confusion and delusions since noon. 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 EXAM: CT HEAD WITHOUT CONTRAST TECHNIQUE: Contiguous axial images were obtained from the base of the skull through the vertex without intravenous contrast. RADIATION DOSE REDUCTION: This exam  was performed according to the departmental dose-optimization program which includes automated exposure control, adjustment of the mA and/or kV according to patient size and/or use of iterative reconstruction technique. COMPARISON:  MR head without and with contrast 01/26/2022 FINDINGS: Brain: Focal hypoattenuation in the left cerebellum and high anterior right frontal lobe corresponds with edema about known lesions. Mild scattered white matter hypoattenuation scratched at mild diffuse white matter hypoattenuation is otherwise stable. No acute infarct or hemorrhage is present. No new areas of edema present to suggest significant progression of disease. The ventricles are of normal size. No significant extraaxial fluid collection is present. Vascular: Atherosclerotic calcifications are present within the cavernous internal carotid arteries. No hyperdense vessel is present. Skull: Calvarium is intact. No focal lytic or blastic lesions are present. No significant extracranial soft tissue lesion is present. Sinuses/Orbits: The paranasal sinuses and mastoid air cells are clear. The globes and orbits are within normal limits. IMPRESSION: 1. No acute intracranial abnormality. 2. Stable appearance of edema about known lesions in the left cerebellum and high anterior right frontal lobe. 3. No new areas of edema to suggest significant progression of disease. 4. Atherosclerosis. Electronically Signed   By: San Morelle M.D.   On: 03/14/2022 16:33   DG Chest Portable 1 View  Result Date: 03/14/2022 CLINICAL DATA:  Altered mental status. Fever. Stage IV lung cancer. EXAM: PORTABLE CHEST 1 VIEW COMPARISON:  01/14/2022 and prior studies FINDINGS: Unchanged cardiomediastinal silhouette with CABG changes again noted. RIGHT IJ central venous catheter with tip overlying the cavoatrial junction again noted. Scattered nodules within both lungs are again identified compatible with metastatic disease, better visualized with  CT. No definite airspace disease identified. A small RIGHT pleural effusion with mild RIGHT basilar atelectasis noted. There is no evidence of pneumothorax. IMPRESSION: 1. Small RIGHT pleural effusion with mild RIGHT basilar atelectasis. 2. Bilateral pulmonary nodules/metastatic disease again noted. Electronically Signed   By: Margarette Canada M.D.   On: 03/14/2022 15:32    Procedures .Critical Care  Performed by: Hendricks Limes, PA-C Authorized by: Hendricks Limes, PA-C   Critical care provider statement:    Critical care time (minutes):  35  Critical care time was exclusive of:  Separately billable procedures and treating other patients   Critical care was necessary to treat or prevent imminent or life-threatening deterioration of the following conditions:  Sepsis   Critical care was time spent personally by me on the following activities:  Ordering and performing treatments and interventions, ordering and review of laboratory studies, ordering and review of radiographic studies, pulse oximetry, re-evaluation of patient's condition, blood draw for specimens, development of treatment plan with patient or surrogate, discussions with consultants, discussions with primary provider and evaluation of patient's response to treatment     Medications Ordered in ED Medications  piperacillin-tazobactam (ZOSYN) IVPB 3.375 g (0 g Intravenous Stopped 03/14/22 1746)    Followed by  piperacillin-tazobactam (ZOSYN) IVPB 3.375 g (has no administration in time range)  vancomycin (VANCOCIN) IVPB 1000 mg/200 mL premix (1,000 mg Intravenous New Bag/Given 03/14/22 1750)  lactated ringers infusion (has no administration in time range)  sodium chloride 0.9 % bolus 1,000 mL ( Intravenous Stopped 03/14/22 1656)  iohexol (OMNIPAQUE) 300 MG/ML solution 100 mL (100 mLs Intravenous Contrast Given 03/14/22 1551)  lactated ringers bolus 1,000 mL (0 mLs Intravenous Stopped 03/14/22 1730)  ondansetron (ZOFRAN) injection 4 mg (4  mg Intravenous Given 03/14/22 1752)    ED Course/ Medical Decision Making/ A&P Clinical Course as of 03/14/22 Effie Shy Mar 14, 5860  4675 66 year old male with known neuroendocrine tumor with mets here with increased confusion starting today.  You had been on chronic narcotics and recently was tapering them.  It is difficult to get any reliable history out of him.  He is moving all his extremities nonfocally.  Tachycardic but afebrile.  Getting cultures labs and imaging.  Will need admission to the hospital for further management. [MB]  1194 I spoke with Dr. Flossie Buffy with [facility] hospitalist who agrees to admit the patient. [CF]  1740 CBC with Differential(!) There is evidence of leukopenia and anemia.  He seems to be at the patient's baseline. [CF]  1832 Comprehensive metabolic panel(!) CMP shows transaminitis which is new for the patient.  No electrolyte abnormalities. [CF]  8144 Culture, blood (Routine x 2) Cultures are obtained and pending. [CF]  8185 Protime-INR(!) PT/INR prolonged. [CF]  1833 Urinalysis, Routine w reflex microscopic(!) Urinalysis without signs of infection. [CF]  6314 Rapid urine drug screen (hospital performed)(!) Opiates positive.  He is prescribed chronic opiates. [CF]  1833 Lactic acid, plasma(!!) Initial and secondary lactic are both elevated. [CF]  9702 SARS Coronavirus 2 by RT PCR (hospital order, performed in [facility] hospital lab) *cepheid single result test* Anterior Nasal Swab Pending. [CF]  1833 CT Head Wo Contrast CT head shows evidence of stable brain mets.  I reviewed this image.  I do agree with the radiologist hypertension. [CF]  6378 CT CHEST ABDOMEN PELVIS W CONTRAST CT chest abdomen pelvis with contrast which I personally ordered and interpreted does not show any intra-abdominal pathology.  There is evidence of worsening disease what I can see on the CT chest.  I do agree with radiologist interpretation. [CF]    Clinical Course User Index [CF]  Hendricks Limes, PA-C [MB] Hayden Rasmussen, MD                           Medical Decision Making TATUM MASSMAN is a 66 y.o. male patient who presents to the emergency department today for further evaluation of altered mental status.  Patient is quite  confused, tachycardic, and hypotensive.  This could be some indications of withdrawal however with lower blood pressure I would not expect this.  We will give the patient some fluids, get sepsis labs, scan his abdomen and head.  I will plan to reassess.  Despite being confused, he is in no acute distress at this time.  Patient still altered.  He is SIRS positive without a significant source of infection.  Given the clinical scenario, I do feel that the patient would likely benefit from further evaluation for altered mental status, worsening lung disease, and SIRS without identifiable source.  I will speak to the hospitalist service to get him admitted.  He is currently on broad-spectrum antibiotics.  Amount and/or Complexity of Data Reviewed Labs: ordered. Radiology: ordered.  Risk Prescription drug management. Decision regarding hospitalization.   Final Clinical Impression(s) / ED Diagnoses Final diagnoses:  Altered mental status, unspecified altered mental status type  SIRS (systemic inflammatory response syndrome) [facility])    Rx / DC Orders ED Discharge Orders     None         Cherrie Gauze 03/14/22 1835    Hayden Rasmussen, MD 03/15/22 1331

## 2022-03-14 NOTE — Progress Notes (Signed)
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  Family began to note signs of delirium today around noon.  Became disoriented to place and time.  Reportedly he is on chronic Dilaudid 3 times daily but has only taken 2 pills in the past 72 hours.  Last Friday he had flulike symptoms potentially due to withdrawal.  In the ED, he was afebrile tachycardic tachypneic and normotensive on room air. Has leukopenia and elevated lactate of 3.3. UA was negative. UDS positive for opioids.  CT head was negative with stable known brain lesion. CT abdomen pelvis showed worsening metastatic disease in the lungs and increased streaky and confluent opacity in the right lower lobe that could be atelectasis, tumor versus infection.  No obvious source of infection was found but given SIRS criteria.  He was started empirically on Zosyn by ED PA. Hospitalist was then consulted for transfer for further management.  Plan of care: The patient is accepted for admission to Progressive unit, at [facility]..  ED PA to continue management of patient while he remains in the ED  Author: Orene Desanctis, DO 03/14/2022  Check [email or web address] for on-call coverage.  Nursing staff, Please call [place] number on Amion as soon as patient's arrival, so appropriate admitting provider can evaluate the pt.

## 2022-03-14 NOTE — ED Notes (Signed)
Bladder scan 51ml

## 2022-03-14 NOTE — Progress Notes (Signed)
Patient family- [name] (wife) and Ria Comment (Daughter) called and spoke with CN  in reference to pain management for the patient. Family is concerned and stated patient stopped taking his prescribed  chronic dose of dilaudid at home. Family expressed pt being very disoriented today and only taken 2 dilaudid over the past few days. They would like for pain management to be restarted to prevent patient from going through any unnecessary medical issues. Will make assigned nurse aware of conversation. Assigned nurse to follow-up with attending MD for pain management care plan.

## 2022-03-14 NOTE — ED Triage Notes (Signed)
Been taking dilaudid 3 TID. Has been having delusions & confusion since 1200. States Friday he started taking less pills.  HX of cancer. States has been calling everything the bathroom.  Weakness, nausea, fever.   Unable to answer orientation questions, states "bathroom" to all questions.

## 2022-03-14 NOTE — Progress Notes (Signed)
RN unable to reach [place], wife of pt. RN left message containing RN name and work phone number.

## 2022-03-14 NOTE — H&P (Signed)
History and Physical    Patient: [name] FBP:[id number] DOB: 1956/04/06 DOA: 03/14/2022 DOS: the patient was seen and examined on 03/15/2022 PCP: [name], MD  Patient coming from: [facility]  Chief Complaint:  Chief Complaint  Patient presents with   Altered Mental Status   HPI: [name] is a 66 y.o. 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  Daughter provides hx over the phone as pt is completely disoriented. She just moved in with him this week and was planning on help more with his care.   About 3 days ago he ran out of Dilaudid briefly and daughter thinks that after he had them filled he made a conscious decision to quit taking them on his own.  He had symptoms of shivering, nausea and diarrhea 2 days ago.  Decrease fluid intake.  Then today around noon he had an acute change in his mental status and became completely disoriented.  He normally is alert and oriented x4 at baseline.  Daughter decided to count his Dilaudid pills and noticed he had only taken 2 pills in the past 72 hours although it is prescribed for 3 times daily PRN. She immediately gave him one Dilaudid pill prior to coming to ED.  She reports that he has been dealing with on and off opioid use for decades due to issues with pain. He also has MS contin 60mg  q12hr and ativan 1mg  q8hr on his list. She is unsure how or if he has been taking these. She is also concerned about stroke since he hx of TIA but no focal weakness that she has noticed. Unsure if he took Xarelto for his atrial fibrillation.   In the ED, he was afebrile tachycardic tachypneic and normotensive on room air. Has leukopenia and  elevated lactate of 3.3. Creatinine was elevated 1.11 from prior of 0.97. UA was negative. UDS positive for opioids.  CT head was negative with stable appearing edema known brain lesion. CT abdomen pelvis showed worsening metastatic disease in the lungs and increased streaky and confluent opacity in the right lower lobe that could be atelectasis, tumor versus infection.  No obvious source of infection was found but given SIRS criteria.  He was started on Zosyn, vancomycin and cefepime by ED PA. Hospitalist was then consulted for transfer for further management. Review of Systems: unable to review all systems due to the inability of the patient to answer questions. Past Medical History:  Diagnosis Date   Anemia    Anxiety    Arthritis    Basal cell carcinoma (BCC) of forehead    CAD (coronary artery disease)    a. 10/2015 ant STEMI >> LHC with 3 v CAD; oLAD tx with POBA >> emergent CABG. b. Multiple evals since that time, early graft failure of SVG-RCA by cath 03/2016. c. 2/19 PCI/DES x1 to pRCA, normal EF.   Carotid artery disease ([place])    a. 40-59% BICA 02/2018.   Depression    Dyspnea    Ectopic atrial tachycardia (HCC)    Esophageal reflux    eosinophil esophagitis   Family history of adverse reaction to anesthesia    "sister has PONV" (06/21/2017)   Former tobacco use    Gout  Hepatitis C    "treated and cured" (06/21/2017)   High cholesterol    History of blood transfusion    History of kidney stones    Hypertension    Ischemic cardiomyopathy    a. EF 25-30% at intraop TEE 4/17  //  b. Limited Echo 5/17 - EF 45-50%, mild ant HK. c. EF 55-65% by cath 09/2017.   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[place])    Migraine    "3-4/yr" (06/21/2017)   Myocardial infarction ([place]) 10/2015   Palpitations    Pneumonia    Sinus bradycardia    a. HR dropping into 40s in 02/2016 -> BB reduced.   Stroke [facility]) 10/2016   "small one; sometimes my memory/cognitive issues" (06/21/2017)    Symptomatic hypotension    a. 02/2016 ER visit -> meds reduced.   Syncope    Wears dentures    Wears glasses    Past Surgical History:  Procedure Laterality Date   25 GAUGE PARS PLANA VITRECTOMY WITH 20 GAUGE MVR PORT  12/31/2020   Procedure: 25 GAUGE PARS PLANA VITRECTOMY WITH 20 GAUGE MVR PORT;  Surgeon: [name], MD;  Location: [place];  Service: Thoracic;;   ANTERIOR CERVICAL DECOMP/DISCECTOMY FUSION N/A 10/17/2018   Procedure: Anterior Cervical Decompression Fusion - Cervical seven -Thoracic one;  Surgeon: Consuella Lose, MD;  Location: [place];  Service: Neurosurgery;  Laterality: N/A;   BASAL CELL CARCINOMA EXCISION     "forehead   BIOPSY  07/20/2019   Procedure: BIOPSY;  Surgeon: Carol Ada, MD;  Location: [facility];  Service: Endoscopy;;   BIOPSY OF MEDIASTINAL MASS Right 07/29/2021   Procedure: RIGHT CHEST WALL MASS BIOPSY;  Surgeon: [name], MD;  Location: [place];  Service: Thoracic;  Laterality: Right;   BRONCHIAL BIOPSY  12/24/2020   Procedure: BRONCHIAL BIOPSIES;  Surgeon: Garner Nash, DO;  Location: [facility];  Service: Pulmonary;;   BRONCHIAL BRUSHINGS  12/24/2020   Procedure: BRONCHIAL BRUSHINGS;  Surgeon: Garner Nash, DO;  Location: [place];  Service: Pulmonary;;   BRONCHIAL NEEDLE ASPIRATION BIOPSY  12/24/2020   Procedure: BRONCHIAL NEEDLE ASPIRATION BIOPSIES;  Surgeon: Garner Nash, DO;  Location: [place];  Service: Pulmonary;;   CARDIAC CATHETERIZATION N/A 11/28/2015   Procedure: Left Heart Cath and Coronary Angiography;  Surgeon: [name], MD;  Location: [facility] [facility];  Service: Cardiovascular;  Laterality: N/A;   CARDIAC CATHETERIZATION N/A 11/28/2015   Procedure: Coronary Balloon Angioplasty;  Surgeon: [name], MD;  Location: [place] [facility];  Service: Cardiovascular;  Laterality: N/A;  ostial LAD   CARDIAC CATHETERIZATION N/A 11/28/2015   Procedure: Coronary/Graft Angiography;  Surgeon:  [name], MD;  Location: [place] [facility];  Service: Cardiovascular;  Laterality: N/A;  coronaries only    CARDIAC CATHETERIZATION N/A 04/21/2016   Procedure: Left Heart Cath and Coronary Angiography;  Surgeon: Wellington Hampshire, MD;  Location: [place] [facility];  Service: Cardiovascular;  Laterality: N/A;   CARDIAC CATHETERIZATION N/A 06/14/2016   Procedure: Left Heart Cath and Cors/Grafts Angiography;  Surgeon: Lorretta Harp, MD;  Location: [place] [facility];  Service: Cardiovascular;  Laterality: N/A;   CARDIAC CATHETERIZATION N/A 09/08/2016   Procedure: Left Heart Cath and Cors/Grafts Angiography;  Surgeon: Wellington Hampshire, MD;  Location: [place] [facility];  Service: Cardiovascular;  Laterality: N/A;   CARDIAC CATHETERIZATION     CORONARY ARTERY BYPASS GRAFT N/A 11/28/2015   Procedure: CORONARY ARTERY BYPASS GRAFTING (CABG)  TIMES FIVE USING LEFT INTERNAL MAMMARY ARTERY AND RIGHT GREATER SAPHENOUS,VIEN HARVEATED BY ENDOVIEN, INTRAOPPRATIVE TEE;  Surgeon: Gaye Pollack, MD;  Location: [place];  Service: Open Heart Surgery;  Laterality: N/A;   CORONARY STENT INTERVENTION N/A 10/05/2017   Procedure: CORONARY STENT INTERVENTION;  Surgeon: [name], MD;  Location: [place] [facility];  Service: Cardiovascular;  Laterality: N/A;   ESOPHAGOGASTRODUODENOSCOPY (EGD) WITH PROPOFOL N/A 07/20/2019   Procedure: ESOPHAGOGASTRODUODENOSCOPY (EGD) WITH PROPOFOL;  Surgeon: Carol Ada, MD;  [facility];  Service: Endoscopy;  Laterality: N/A;   ESOPHAGOGASTRODUODENOSCOPY (EGD) WITH PROPOFOL N/A 01/30/2021   Procedure: ESOPHAGOGASTRODUODENOSCOPY (EGD) WITH PROPOFOL;  Surgeon: Carol Ada, MD;  [facility];  Service: Endoscopy;  Laterality: N/A;   FIDUCIAL MARKER PLACEMENT  12/24/2020   Procedure: FIDUCIAL DYE MARKER PLACEMENT;  Surgeon: Garner Nash, DO;  Location: [facility];  Service: Pulmonary;;   HUMERUS SURGERY Right 1969   "tumor inside bone; filled it  w/bone chips"   INTERCOSTAL NERVE BLOCK Right 12/24/2020   Procedure: INTERCOSTAL NERVE BLOCK;  Surgeon: [name], MD;  Location: [place];  Service: Thoracic;  Laterality: Right;   IR IMAGING GUIDED PORT INSERTION  02/06/2021   LEFT HEART CATH AND CORS/GRAFTS ANGIOGRAPHY N/A 03/11/2017   Procedure: Left Heart Cath and Cors/Grafts Angiography;  Surgeon: Leonie Man, MD;  Location: [place] [facility];  Service: Cardiovascular;  Laterality: N/A;   LEFT HEART CATH AND CORS/GRAFTS ANGIOGRAPHY N/A 10/05/2017   Procedure: LEFT HEART CATH AND CORS/GRAFTS ANGIOGRAPHY;  Surgeon: [name], MD;  Location: [place] [facility];  Service: Cardiovascular;  Laterality: N/A;   LEFT HEART CATH AND CORS/GRAFTS ANGIOGRAPHY N/A 04/11/2019   Procedure: LEFT HEART CATH AND CORS/GRAFTS ANGIOGRAPHY;  Surgeon: [name], MD;  Location: [place] [facility];  Service: Cardiovascular;  Laterality: N/A;   NODE DISSECTION Right 12/24/2020   Procedure: NODE DISSECTION;  Surgeon: [name], MD;  Location: [place];  Service: Thoracic;  Laterality: Right;   PERIPHERAL VASCULAR CATHETERIZATION N/A 06/14/2016   Procedure: Lower Extremity Angiography;  Surgeon: Lorretta Harp, MD;  Location: [place] [facility];  Service: Cardiovascular;  Laterality: N/A;   VIDEO BRONCHOSCOPY WITH ENDOBRONCHIAL NAVIGATION Right 12/24/2020   Procedure: VIDEO BRONCHOSCOPY WITH ENDOBRONCHIAL NAVIGATION;  Surgeon: Garner Nash, DO;  Location: [place];  Service: Pulmonary;  Laterality: Right;   VIDEO BRONCHOSCOPY WITH ENDOBRONCHIAL ULTRASOUND N/A 12/24/2020   Procedure: VIDEO BRONCHOSCOPY WITH ENDOBRONCHIAL ULTRASOUND;  Surgeon: Garner Nash, DO;  Location: [place];  Service: Pulmonary;  Laterality: N/A;   VIDEO BRONCHOSCOPY WITH INSERTION OF INTERBRONCHIAL VALVE (IBV) N/A 12/31/2020   Procedure: VIDEO BRONCHOSCOPY WITH INSERTION OF INTERBRONCHIAL VALVE (IBV).VALVE IN CARTRIDGE 23mm,9mm. CHEST TUBE  PLACEMENT.;  Surgeon: [name], MD;  [facility];  Service: Thoracic;  Laterality: N/A;   VIDEO BRONCHOSCOPY WITH INSERTION OF INTERBRONCHIAL VALVE (IBV) N/A 07/29/2021   Procedure: VIDEO BRONCHOSCOPY WITH REMOVAL OF INTERBRONCHIAL VALVE (IBV);  Surgeon: [name], MD;  Location: [place];  Service: Thoracic;  Laterality: N/A;   Social History:  reports that he quit smoking about 6 years ago. His smoking use included cigarettes. He has a 33.00 pack-year smoking history. He has never used smokeless tobacco. He reports that he does not currently use alcohol. He reports that he does not use drugs.  Allergies  Allergen Reactions   Prednisone Other (See Comments)    States that this med makes him "crazy"   Tetanus Toxoids  Swelling and Other (See Comments)    Fever, Swelling of the arm    Wellbutrin [Bupropion] Other (See Comments)    Crazy thoughts, nightmares   Varenicline Other (See Comments)    Unpleasant dreams    Family History  Problem Relation Age of Onset   Lung cancer Mother    Heart Problems Father    Heart attack Father 74   Stroke Father    Heart failure Father    Heart attack Maternal Grandmother    Stroke Maternal Grandmother    Heart attack Paternal Uncle    Hypertension Brother    Autoimmune disease Neg Hx     Prior to Admission medications   Medication Sig Start Date End Date Taking? Authorizing Provider  acetaminophen (TYLENOL) 500 MG tablet Take 1,000 mg by mouth every 6 (six) hours as needed for mild pain, fever or headache.    [provider]  aspirin EC 81 MG tablet Take 81 mg by mouth daily. Swallow whole. Patient not taking: Reported on 03/09/2022    [provider]  benzonatate (TESSALON) 100 MG capsule TAKE 1 CAPSULE(100 MG) BY MOUTH THREE TIMES DAILY AS NEEDED FOR COUGH 03/01/22   Heilingoetter, Cassandra L, PA-C  dexamethasone (DECADRON) 4 MG tablet Take 1 tablet (4 mg total) by mouth daily. 03/05/22   Kyung Rudd, MD   diclofenac (VOLTAREN) 25 MG EC tablet Take by mouth. Patient not taking: Reported on 03/09/2022 11/01/21   [provider]  DULoxetine (CYMBALTA) 30 MG capsule Take 1 capsule (30 mg total) by mouth 2 (two) times daily. 02/02/22   Pickenpack-Cousar, Carlena Sax, NP  folic acid (FOLVITE) 1 MG tablet Take 1 tablet (1 mg total) by mouth daily. 09/17/21   Curt Bears, MD  HYDROmorphone (DILAUDID) 8 MG tablet Take 1 tablet by mouth every 6 hours as needed for severe pain. 03/12/22   Pickenpack-Cousar, Carlena Sax, NP  lidocaine-prilocaine (EMLA) cream Apply to the Port-A-Cath site 30-60-minute before treatment 11/09/21   Curt Bears, MD  LORazepam (ATIVAN) 1 MG tablet Take 1 tablet (1 mg total) by mouth every 8 (eight) hours as needed for anxiety. 02/25/22   Pickenpack-Cousar, Carlena Sax, NP  magnesium oxide (MAG-OX) 400 (240 Mg) MG tablet Take 1 tablet by mouth daily. 12/28/21   [provider]  metoprolol succinate (TOPROL-XL) 25 MG 24 hr tablet Take 0.5 tablets (12.5 mg total) by mouth daily. 03/09/22   Elgie Collard, PA-C  morphine (MS CONTIN) 60 MG 12 hr tablet Take 1 tablet by mouth every 12  hours. 03/19/22   Pickenpack-Cousar, Carlena Sax, NP  ondansetron (ZOFRAN) 8 MG tablet Take 1 tablet by mouth every 8 hours as needed for nausea or vomiting. Starting 3 days after chemotherapy 01/11/22   Pickenpack-Cousar, Carlena Sax, NP  polyethylene glycol (MIRALAX / GLYCOLAX) 17 g packet Take 17 g by mouth daily. 09/04/21   Mariel Aloe, MD  prochlorperazine (COMPAZINE) 10 MG tablet Take 1 tablet (10 mg total) by mouth every 6 (six) hours as needed. 02/02/22   Pickenpack-Cousar, Carlena Sax, NP  senna-docusate (SENOKOT-S) 8.6-50 MG tablet Take 1 tablet by mouth 2 (two) times daily. 10/06/21   Eugenie Filler, MD  tiZANidine (ZANAFLEX) 2 MG tablet Take 1 tablet (2 mg total) by mouth at bedtime as needed for muscle spasms. 02/02/22   Pickenpack-Cousar, Carlena Sax, NP  XARELTO 20 MG TABS tablet Take 20 mg by mouth  every evening. 06/01/21   [provider]    Physical Exam:  Vitals:   03/14/22 1921 03/14/22 1954 03/14/22 2107 03/15/22 0041  BP: (!) 92/56 (!) 109/57 118/65 99/66  Pulse: 75 64 91 78  Resp: (!) 21 (!) 23 20   Temp: 99.3 F (37.4 C)  98.5 F (36.9 C) 98.4 F (36.9 C)  TempSrc: Oral  Oral Oral  SpO2: 95% 97% 100% 99%  Weight:      Height:       Constitutional: NAD, calm, comfortable, nontoxic appearing elderly gentleman laying in bed.  Patient attempts to converse but answers are either nonsensical or unintelligible. Eyes: lids and conjunctivae normal ENMT: Mucous membranes are moist. Neck: normal, supple, no masses, no thyromegaly Respiratory: clear to auscultation bilaterally, no wheezing, no crackles. Normal respiratory effort. No accessory muscle use.  Cardiovascular: Regular rate and rhythm, no murmurs / rubs / gallops. No extremity edema.  Abdomen: Soft, nondistended no tenderness, no masses palpated.  Bowel sounds positive.  Musculoskeletal: no clubbing / cyanosis. No joint deformity upper and lower extremities. Good ROM, no contractures. Normal muscle tone.  Skin: no rashes, lesions, ulcers. Neurologic: Alert but disoriented.  Answers to questions are nonsensical unintelligible.  When asked what his name was he says "bastard."  Does not know the year or where he is located.  He is able to move all extremities and follow commands with lower extremity strength testing and bilateral handgrip. Psychiatric: Disoriented.    Data Reviewed:  See HPI  Assessment and Plan: * Acute metabolic encephalopathy Suspect multifactorial from edema surrounding known metastatic brain lesions, opioid withdrawal and dehydration. -Patient with metastatic brain cancer on high doses of opioids and benzodiazepine for pain and anxiety.  These include 8 mg Dilaudid q8hr, MS Contin 60mg  q12, ativan 1mg  q8hr.  He is followed by palliative pain management and in his last follow-up in June they  were undergoing slow taper.  Daughter knows that he has taken only 2 Dilaudid pills in the past for 72 hours from a pill count, but is unclear whether he has been taking the rest of his medications.  UDS is positive for opioids but daughter gave him a dose of Dilaudid prior to presenting to ED. He had signs of tremors, nausea and diarrhea over the weekend concerning for withdrawal symptoms.  This then led to his dehydration and AKI. -will give IV 8mg  of decadron for edema seen on known CT head lesions -continuous IVF for AKI -Will continue MS contin 60mg  q12 and hold PRN Dilaudid for now since blood pressure are borderline soft  -also obtain MRI brain with and without contrast -macrocytic anemia is noted-check Vitamin X93 and folic  -He was started on IV abx in ED due to SIRS criteria although low suspicion without a clear source.CT chest had a questionable opacity in right lower lobe. However, given he has leukopenia and is immunocompromised, will continue IV cefepime and flagyl pending blood cultures. Also get GI panel for diarrhea.    Leukopenia Likely secondary to chemotherapy.  Anemia Pt later developed acute on chronic anemia on repeat CBC. Presented with Hgb of 9.6 but now is at 6.8. He has received close to 2L IV fluid but drop is concerning for more than just dilution. However no overt signs of bleeding. Will check FOBT if he has bowel movement.  -hold Xarelto for now -Transfuse 1 upRBC and follow post H/H. Goal of Hgb >7.  -Z16 and folic levels pending  AF (paroxysmal atrial fibrillation) (HCC) Holding Xarelto for acute anemia requiring transfusion  Acute renal failure superimposed  on stage 3a chronic kidney disease (HCC) Creatinine elevated to 1.11 from 0.97 -on IV fluids. Monitor with repeat tomorrow.  -avoid nephrotoxic agents  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HCC) Large B cell neuroendocrine carcinoma with right upper lobe lung nodule, right hilar  adenopathy in April 2022.  Also found to have brain metastasis in May 2022. -S/p right upper lobectomy and underwent systemic chemotherapy later discontinued due to intolerance. Underwent palliative radiation. -For brain metastasis he underwent whole brain irradiation -Currently undergoing systemic chemotherapy with carboplatin, Jaynie Collins -Follows with Dr. [name] and last f/u on 02/19/22 and initally had plans of repeat scans for restaging  - CT Chest/Abd/Pelvis now showing increased metastatic disease with increasing size and number of pulmonary nodules, enlarging mediastinal right hilar and aortovaval lymph nodes.  Increasing streaky and opacity in the right lower lobe that could be atelectasis, tumor or infection. -CT head with stable edema around known lesions  S/P CABG x 5 Stable. Continue to monitor.      Advance Care Planning:   Code Status: DNR -confirmed with daughter and wife. Has living will at home.  Consults: none  Family Communication: Discussed with daughter over the phone.  Severity of Illness: The appropriate patient status for this patient is INPATIENT. Inpatient status is judged to be reasonable and necessary in order to provide the required intensity of service to ensure the patient's safety. The patient's presenting symptoms, physical exam findings, and initial radiographic and laboratory data in the context of their chronic comorbidities is felt to place them at high risk for further clinical deterioration. Furthermore, it is not anticipated that the patient will be medically stable for discharge from the hospital within 2 midnights of admission.   * I certify that at the point of admission it is my clinical judgment that the patient will require inpatient hospital care spanning beyond 2 midnights from the point of admission due to high intensity of service, high risk for further deterioration and high frequency of surveillance required.*  Author: Orene Desanctis,  DO 03/15/2022 2:28 AM  For on call review [email or web address].

## 2022-03-15 ENCOUNTER — Telehealth: Payer: Self-pay | Admitting: Medical Oncology

## 2022-03-15 DIAGNOSIS — Z515 Encounter for palliative care: Secondary | ICD-10-CM

## 2022-03-15 DIAGNOSIS — G9341 Metabolic encephalopathy: Secondary | ICD-10-CM

## 2022-03-15 DIAGNOSIS — R4182 Altered mental status, unspecified: Secondary | ICD-10-CM | POA: Diagnosis not present

## 2022-03-15 DIAGNOSIS — Z7189 Other specified counseling: Secondary | ICD-10-CM

## 2022-03-15 DIAGNOSIS — C7A1 Malignant poorly differentiated neuroendocrine tumors: Secondary | ICD-10-CM | POA: Diagnosis not present

## 2022-03-15 DIAGNOSIS — Z66 Do not resuscitate: Secondary | ICD-10-CM

## 2022-03-15 LAB — CBC
HCT: 20.7 % — ABNORMAL LOW (ref 39.0–52.0)
Hemoglobin: 6.8 g/dL — CL (ref 13.0–17.0)
MCH: 33.7 pg (ref 26.0–34.0)
MCHC: 32.9 g/dL (ref 30.0–36.0)
MCV: 102.5 fL — ABNORMAL HIGH (ref 80.0–100.0)
Platelets: 177 10*3/uL (ref 150–400)
RBC: 2.02 MIL/uL — ABNORMAL LOW (ref 4.22–5.81)
RDW: 18 % — ABNORMAL HIGH (ref 11.5–15.5)
WBC: 3.2 10*3/uL — ABNORMAL LOW (ref 4.0–10.5)
nRBC: 0 % (ref 0.0–0.2)

## 2022-03-15 LAB — BASIC METABOLIC PANEL
Anion gap: 9 (ref 5–15)
BUN: 13 mg/dL (ref 8–23)
CO2: 23 mmol/L (ref 22–32)
Calcium: 8.4 mg/dL — ABNORMAL LOW (ref 8.9–10.3)
Chloride: 106 mmol/L (ref 98–111)
Creatinine, Ser: 0.91 mg/dL (ref 0.61–1.24)
GFR, Estimated: >60 mL/min (ref >60)
Glucose, Bld: 105 mg/dL — ABNORMAL HIGH (ref 70–99)
Potassium: 3.5 mmol/L (ref 3.5–5.1)
Sodium: 138 mmol/L (ref 135–145)

## 2022-03-15 LAB — LACTIC ACID, PLASMA
Lactic Acid, Venous: 0.8 mmol/L (ref 0.5–1.9)
Lactic Acid, Venous: 0.9 mmol/L (ref 0.5–1.9)

## 2022-03-15 LAB — FOLATE: Folate: 35.7 ng/mL (ref >5.9)

## 2022-03-15 LAB — VITAMIN B12: Vitamin B-12: 374 pg/mL (ref 180–914)

## 2022-03-15 LAB — PREPARE RBC (CROSSMATCH)

## 2022-03-15 LAB — GLUCOSE, CAPILLARY: Glucose-Capillary: 132 mg/dL — ABNORMAL HIGH (ref 70–99)

## 2022-03-15 LAB — AMMONIA: Ammonia: 8 umol/L — ABNORMAL LOW (ref 9–35)

## 2022-03-15 MED ORDER — METOCLOPRAMIDE HCL 5 MG/ML IJ SOLN
5.0000 mg | Freq: Four times a day (QID) | INTRAMUSCULAR | Status: DC | PRN
  Administered 2022-03-15 – 2022-03-17 (×4): 5 mg via INTRAVENOUS
  Filled 2022-03-15 (×4): qty 2

## 2022-03-15 MED ORDER — HYDROMORPHONE HCL 2 MG PO TABS
8.0000 mg | ORAL_TABLET | Freq: Four times a day (QID) | ORAL | Status: DC | PRN
  Administered 2022-03-15 – 2022-03-17 (×6): 8 mg via ORAL
  Filled 2022-03-15 (×6): qty 4

## 2022-03-15 MED ORDER — ACETAMINOPHEN 325 MG PO TABS
650.0000 mg | ORAL_TABLET | Freq: Four times a day (QID) | ORAL | Status: DC | PRN
  Administered 2022-03-15 – 2022-03-16 (×3): 650 mg via ORAL
  Filled 2022-03-15 (×3): qty 2

## 2022-03-15 MED ORDER — MORPHINE SULFATE ER 30 MG PO TBCR: 60.0000 mg | EXTENDED_RELEASE_TABLET | Freq: Two times a day (BID) | ORAL | Status: DC

## 2022-03-15 MED ORDER — ONDANSETRON HCL 4 MG PO TABS
8.0000 mg | ORAL_TABLET | Freq: Three times a day (TID) | ORAL | Status: DC | PRN
  Administered 2022-03-15: 8 mg via ORAL
  Filled 2022-03-15: qty 2

## 2022-03-15 MED ORDER — RIVAROXABAN 20 MG PO TABS: 20.0000 mg | ORAL_TABLET | Freq: Every evening | ORAL | Status: DC

## 2022-03-15 MED ORDER — DEXAMETHASONE SODIUM PHOSPHATE 10 MG/ML IJ SOLN
8.0000 mg | Freq: Once | INTRAMUSCULAR | Status: AC
  Administered 2022-03-15: 8 mg via INTRAVENOUS
  Filled 2022-03-15: qty 1

## 2022-03-15 MED ORDER — SODIUM CHLORIDE 0.9% IV SOLUTION: Freq: Once | INTRAVENOUS | Status: AC

## 2022-03-15 MED ORDER — SODIUM CHLORIDE 0.9% FLUSH: 10.0000 mL | INTRAVENOUS | Status: DC | PRN

## 2022-03-15 MED ORDER — DEXAMETHASONE SODIUM PHOSPHATE 4 MG/ML IJ SOLN
4.0000 mg | Freq: Every day | INTRAMUSCULAR | Status: DC
  Administered 2022-03-15 – 2022-03-17 (×3): 4 mg via INTRAVENOUS
  Filled 2022-03-15 (×3): qty 1

## 2022-03-15 MED ORDER — DULOXETINE HCL 30 MG PO CPEP
30.0000 mg | ORAL_CAPSULE | Freq: Two times a day (BID) | ORAL | Status: DC
  Administered 2022-03-15 – 2022-03-17 (×5): 30 mg via ORAL
  Filled 2022-03-15 (×5): qty 1

## 2022-03-15 MED ORDER — DEXAMETHASONE 4 MG PO TABS: 4.0000 mg | ORAL_TABLET | Freq: Every day | ORAL | Status: DC

## 2022-03-15 MED ORDER — METRONIDAZOLE 500 MG/100ML IV SOLN
500.0000 mg | Freq: Two times a day (BID) | INTRAVENOUS | Status: DC
  Administered 2022-03-15 – 2022-03-16 (×3): 500 mg via INTRAVENOUS
  Filled 2022-03-15 (×3): qty 100

## 2022-03-15 MED ORDER — CHLORHEXIDINE GLUCONATE CLOTH 2 % EX PADS
6.0000 | MEDICATED_PAD | Freq: Every day | CUTANEOUS | Status: DC
  Administered 2022-03-15 – 2022-03-17 (×3): 6 via TOPICAL

## 2022-03-15 MED ORDER — SODIUM CHLORIDE 0.9% FLUSH
10.0000 mL | Freq: Two times a day (BID) | INTRAVENOUS | Status: DC
  Administered 2022-03-15 – 2022-03-16 (×3): 10 mL
  Administered 2022-03-17: 40 mL

## 2022-03-15 MED ORDER — MORPHINE SULFATE ER 30 MG PO TBCR
60.0000 mg | EXTENDED_RELEASE_TABLET | Freq: Two times a day (BID) | ORAL | Status: DC
  Administered 2022-03-15 – 2022-03-17 (×5): 60 mg via ORAL
  Filled 2022-03-15 (×6): qty 2

## 2022-03-15 NOTE — TOC Initial Note (Addendum)
Transition of Care [place]) - Initial/Assessment Note    Patient Details  Name: [name] MRN: [id number] Date of Birth: 05-26-1956  [facility] CM/SW Contact:    Ross Ludwig, LCSW Phone Number: 03/15/2022, 10:07 PM  Clinical Narrative:                  Patient is from home with his wife.  CSW received message that patient's wife had some questions about what equipment he is eligible for at home with his insurance.  CSW attempted to call his wife, but had to leave a message on voice mail.  CSW spoke to patient and he said they just received some bad news, he and his wife are trying to process it.  He requested to try to contact wife tomorrow regarding equipment and possible [place] services.  CSW to continue to follow patient's progress throughout discharge planning.  Expected Discharge Plan: [place] Barriers to Discharge: Continued Medical Work up   Patient Goals and CMS Choice Patient states their goals for this hospitalization and ongoing recovery are:: To return back home with home health. CMS Medicare.gov Compare Post Acute Care list provided to:: Patient Choice offered to / list presented to : Patient  Expected Discharge Plan and Services Expected Discharge Plan: [place] Choice: Durable Medical Equipment, Home Health Living arrangements for the past 2 months: Single Family Home                                      Prior Living Arrangements/Services Living arrangements for the past 2 months: Single Family Home Lives with:: Spouse Patient language and need for interpreter reviewed:: Yes Do you feel safe going back to the place where you live?: Yes      Need for Family Participation in Patient Care: Yes (Comment) Care giver support system in place?: No (comment)   Criminal Activity/Legal Involvement Pertinent to Current Situation/Hospitalization: No - Comment as needed  Activities of  Daily Living      Permission Sought/Granted Permission sought to share information with : Case Manager, Family Supports, Customer service manager Permission granted to share information with : Yes, Verbal Permission Granted  Share Information with NAME: Armin, Yerger [phone]  [phone]  Flem, [place] Daughter [phone]  [phone]  Buel Ream   [phone]  Permission granted to share info w AGENCY: Home Health agencies        Emotional Assessment Appearance:: Appears stated age Attitude/Demeanor/Rapport: Engaged Affect (typically observed): Accepting, Appropriate, Calm, Stable Orientation: : Oriented to Self, Oriented to Place, Oriented to  Time Alcohol / Substance Use: Not Applicable Psych Involvement: No (comment)  Admission diagnosis:  SIRS (systemic inflammatory response syndrome) (HCC) [R65.10] Altered mental status, unspecified altered mental status type [R41.82] AMS (altered mental status) [R41.82] Patient Active Problem List   Diagnosis Date Noted   Acute metabolic encephalopathy 52/77/8242   AMS (altered mental status) 03/14/2022   Blurry vision, bilateral 10/08/2021   Leukopenia 10/08/2021   Wound of back 10/06/2021   Hyponatremia 10/03/2021   Cancer-related breakthrough pain 10/02/2021   Encounter for antineoplastic immunotherapy 09/29/2021   Cancer associated pain 09/15/2021   Constipation 09/15/2021   Right-sided chest wall pain 08/29/2021   Malnutrition of moderate degree 08/20/2021   Atrial tachycardia ([place]) 08/18/2021   Lung cancer ([place]) 07/29/2021   Hemoptysis 06/06/2021  History of pulmonary embolus (PE) 04/29/2021   Anemia 04/29/2021   Hypotension due to hypovolemia 04/28/2021   Hypomagnesemia 04/08/2021   Port-A-Cath in place 04/07/2021   Protein-calorie malnutrition, moderate ([place]) 03/25/2021   PNA (pneumonia) 03/22/2021   Community acquired pneumonia 03/22/2021   Sepsis ([place]) 03/22/2021   CKD (chronic  kidney disease), stage III ([place]) 03/22/2021   Hypoxia 02/28/2021   Dyspnea 02/28/2021   Acute renal failure superimposed on stage 3a chronic kidney disease ([place]) 02/25/2021   AF (paroxysmal atrial fibrillation) ([place]) 02/25/2021   Pleural effusion 02/25/2021   Neutropenia, drug-induced ([place]) 02/24/2021   Positive blood cultures 02/17/2021   Positive blood culture 02/16/2021   Right flank pain 02/09/2021   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[place]) 01/30/2021   Encounter for antineoplastic chemotherapy 01/29/2021   Goals of care, counseling/discussion 01/14/2021   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   Acute back pain with sciatica 01/12/2021   Allergic rhinitis 01/12/2021   Amnesia 01/12/2021   Kidney stone 01/12/2021   Sciatica 01/12/2021   Bipolar disorder ([place]) 01/12/2021   S/P lobectomy of lung 12/24/2020   Solitary lung nodule 11/28/2020   Chest pain 11/07/2019   Gastroesophageal reflux disease    Cervical radiculopathy 10/17/2018   Preoperative clearance 10/04/2018   Palpitations    Coronary artery disease involving coronary bypass graft of native heart with angina pectoris ([place])    Transient loss of consciousness 03/24/2018   Ectopic atrial tachycardia ([place]) 02/09/2018   Central chest pain 03/10/2017   Family hx-stroke 11/10/2016   Stroke-like episode ([place]) - R brain, s/p tPA 11/09/2016   Unstable angina ([place]) 09/07/2016   Claudication of both lower extremities ([place])    Pure hypercholesterolemia    Tobacco abuse disorder    CAD of autologous artery bypass graft without angina    Chest pain at rest 06/10/2016   Abnormal nuclear stress test - HIGH RISK 04/20/2016   Old MI (myocardial infarction)    Essential hypertension 02/26/2016   Ischemic cardiomyopathy 12/25/2015   Hyperlipidemia LDL goal <70 12/25/2015   Mild tobacco abuse in early remission 11/28/2015   Coronary artery  disease involving native coronary artery of native heart with angina pectoris ([place]) 11/28/2015   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   Acute MI anterior wall first episode care [facility])    Precordial chest pain 03/07/2015   Gout attack 03/07/2015   Mixed bipolar I disorder ([place]) 03/07/2015   Fibromyalgia 07/09/2014   Gout 07/09/2014   Anxiety 07/09/2014   Depression 07/09/2014   Hepatitis C 62/37/6283   Eosinophilic esophagitis 15/17/6160   PCP:  [name], MD Pharmacy:   [id number] [address], [address] AT [facility] [address] [place] [address] Phone: [phone] Fax: [phone]  [place] [address] Phone: [phone] Fax: [phone]     Social Determinants of Health (SDOH) Interventions    Readmission Risk Interventions    08/21/2021    9:25 AM 08/01/2021    8:41 AM 03/25/2021   10:13 AM  Readmission Risk Prevention Plan  Transportation Screening Complete Complete Complete  Medication Review (RN Care Manager) Complete Complete Complete  PCP or Specialist appointment within 3-5 days of discharge Complete Complete Complete  HRI or Home Care Consult Complete Complete Complete  SW Recovery Care/Counseling Consult Complete Complete Complete  SW Consult Not Complete Comments  n/a    Palliative Care Screening Not Applicable Complete Not Applicable  Skilled Nursing Facility Not Applicable Not Applicable Not Applicable

## 2022-03-15 NOTE — Assessment & Plan Note (Addendum)
Pt later developed acute on chronic anemia on repeat CBC. Presented with Hgb of 9.6 but now is at 6.8. He has received close to 2L IV fluid but drop is concerning for more than just dilution. However no overt signs of bleeding. Will check FOBT if he has bowel movement.  -hold Xarelto for now -Transfuse 1 upRBC and follow post H/H. Goal of Hgb >7.  -Q33 and folic levels pending

## 2022-03-15 NOTE — Progress Notes (Signed)
Pt wife brought in Living Will Advanced Directive. RN made copy and placed in shadow chart. Returned original documents to wife.

## 2022-03-15 NOTE — Assessment & Plan Note (Signed)
Holding Xarelto for acute anemia requiring transfusion

## 2022-03-15 NOTE — Progress Notes (Addendum)
HEMATOLOGY-ONCOLOGY PROGRESS NOTE  ASSESSMENT AND PLAN: This is 66 year old white male recently diagnosed with a stage IV (T1c, N1, M1 B) large cell neuroendocrine carcinoma presented with right upper lobe lung nodule in addition to right hilar adenopathy in April 2022 and the patient was found to have solitary brain metastasis in May 2022 He is status post right upper lobectomy with lymph node sampling under the care of Dr. Kipp Brood. The patient underwent systemic chemotherapy with cisplatin 80 mg/M2 on day 1 and etoposide 100 Mg/M2 on days 1, 2 and 3 every 3 weeks.  He is status post 4 cycles.  He has a rough time tolerating his adjuvant systemic chemotherapy and it was discontinued after cycle #4. He is currently on observation but continues to have pain in the right side of the chest.  He was seen by Dr. Kipp Brood and underwent resection of the chest wall nodule and the final pathology was consistent with local recurrence of his disease and the right chest wall at the site of one of the chest wall tube. The patient was also found on recent MRI of the brain to have 4 new small brain metastasis that were treated with SRS. He completed palliative radiotherapy to the local disease recurrence in the right side of the chest wall likely from implants after his surgical resection. He is currently undergoing systemic chemotherapy with carboplatin for AUC of 5, Alimta 500 Mg/M2 and Keytruda 200 Mg IV every 3 weeks status post 7 cycles. Starting cycle #2 carboplatin was discontinued secondary to significant pancytopenia.    The patient has now been admitted secondary to altered mental status.  He is back to baseline today.  He had a CT of the head without contrast performed which showed no acute intracranial abnormality and stable appearance of the edema about the known lesions in the left cerebellum and high anterior right frontal lobe.  There were no new areas of edema to suggest significant progression of  disease.  He also had a CT of the chest/abdomen/pelvis performed which showed increasing metastatic disease with increasing size and number of pulmonary nodules and enlarging mediastinal, right hilar, and aortocaval lymph nodes, increasing streaky and confluent opacities in the right lower lobe which may represent atelectasis, tumor, or infection, unchanged irregularity of the posterior right 11th and 12th ribs with new irregularity/fracture of the posterior right ninth rib.  CT scan results were discussed today with the patient and his family.  He has evidence for disease progression.  Recommend for him to continue his current treatment with Alimta and Keytruda.  We discussed other treatment options including Taxotere/Cyramza versus referral to [facility] for consideration of clinical trial versus palliative care/hospice.  The patient and his family would like to pursue clinical trial if he qualifies.  We can make arrangements for him to be seen at [facility] following hospital discharge.  The patient has anemia received 1 unit PRBCs today.  Recommend PRBC transfusion for hemoglobin less than 8.  Palliative care consult has been placed for ongoing management of his pain medication.  Mikey Bussing  SUBJECTIVE: Mr. Maffeo presented to the emergency department with altered mental status.  Per family, he was very disoriented and speaking in nonsensical words.  Daughter noticed that he had not been taking his Dilaudid as he typically did on a regular basis.  Today, patient is back to baseline.  He states that he was having diarrhea at home.  He thinks that this may have been related to taking laxatives.  This morning, hemoglobin is down to 6.8.  He received 1 unit PRBCs.  He denies epistaxis, hemoptysis, hematemesis, hematuria.  He notices a small amount of bright red blood when he wipes after having a bowel movement.  No other new complaints reported today.  Oncology History  Malignant poorly differentiated  neuroendocrine carcinoma ([place])  01/14/2021 Initial Diagnosis   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[place])   02/09/2021 - 04/24/2021 Chemotherapy   Patient is on Treatment Plan : LUNG SMALL CELL - LIMITED STAGE Cisplatin D1 + Etoposide D1-3 q21d     09/29/2021 -  Chemotherapy   Patient is on Treatment Plan : LUNG Carboplatin (5) + Pemetrexed (500) + Pembrolizumab (200) D1 q21d Induction x 4 cycles / Maintenance Pemetrexed (500) + Pembrolizumab (200) D1 q21d     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[place])  01/14/2021 Initial Diagnosis   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[place])   01/29/2021 Cancer Staging   Staging form: Lung, AJCC 8th Edition - Clinical: Stage IVA (cT1c, cN1, cM1b) - Signed by Curt Bears, MD on 01/29/2021   09/29/2021 -  Chemotherapy   Patient is on Treatment Plan : LUNG Carboplatin (5) + Pemetrexed (500) + Pembrolizumab (200) D1 q21d Induction x 4 cycles / Maintenance Pemetrexed (500) + Pembrolizumab (200) D1 q21d        REVIEW OF SYSTEMS:   Review of Systems  Constitutional:  Negative for chills and fever.  HENT: Negative.    Respiratory: Negative.    Cardiovascular: Negative.   Gastrointestinal:  Positive for nausea. Negative for vomiting.  Skin: Negative.   Neurological: Negative.     I have reviewed the past medical history, past surgical history, social history and family history with the patient and they are unchanged from previous note.   PHYSICAL EXAMINATION: ECOG PERFORMANCE STATUS: 2 - Symptomatic, <50% confined to bed  Vitals:   03/15/22 0745 03/15/22 0810  BP: 100/62 (!) 95/50  Pulse: 70 88  Resp: 15 14  Temp: 98.2 F (36.8 C) 98.2 F (36.8 C)  SpO2: 100% 99%   Filed Weights   03/14/22 1431 03/15/22 0533  Weight: 70.3 kg 68.7 kg    Intake/Output from previous day: 07/16 0701 - 07/17 0700 In: 3448 [P.O.:120; I.V.:933.5; IV Piggyback:2394.4] Out: 0   Physical Exam Vitals reviewed.  Constitutional:      General: He is not in  acute distress. HENT:     Head: Normocephalic.  Eyes:     General: No scleral icterus.    Conjunctiva/sclera: Conjunctivae normal.  Pulmonary:     Effort: Pulmonary effort is normal. No respiratory distress.  Skin:    General: Skin is warm and dry.  Neurological:     Mental Status: He is alert and oriented to person, place, and time.     LABORATORY DATA:  I have reviewed the data as listed    Latest Ref Rng & Units 03/15/2022    1:18 AM 03/14/2022    2:48 PM 02/25/2022    9:59 AM  CMP  Glucose 70 - 99 mg/dL 105  105  93   BUN 8 - 23 mg/dL 13  13  20    Creatinine 0.61 - 1.24 mg/dL 0.91  1.11  0.97   Sodium 135 - 145 mmol/L 138  136  136   Potassium 3.5 - 5.1 mmol/L 3.5  3.5  4.2   Chloride 98 - 111 mmol/L 106  101  101   CO2 22 - 32 mmol/L 23  20  28   Calcium 8.9 - 10.3 mg/dL 8.4  9.8  9.8   Total Protein 6.5 - 8.1 g/dL  8.1  7.1   Total Bilirubin 0.3 - 1.2 mg/dL  1.0  0.5   Alkaline Phos 38 - 126 U/L  106  91   AST 15 - 41 U/L  50  27   ALT 0 - 44 U/L  52  25     Lab Results  Component Value Date   WBC 3.2 (L) 03/15/2022   HGB 6.8 (LL) 03/15/2022   HCT 20.7 (L) 03/15/2022   MCV 102.5 (H) 03/15/2022   PLT 177 03/15/2022   NEUTROABS 2.5 03/14/2022    No results found for: "CEA1", "CEA", "WEX937", "CA125", "PSA1"  CT CHEST ABDOMEN PELVIS W CONTRAST  Result Date: 03/14/2022 CLINICAL DATA:  66 year old male with acute chest, abdominal and pelvic pain. History of non-small cell lung cancer. EXAM: CT CHEST, ABDOMEN, AND PELVIS WITH CONTRAST TECHNIQUE: Multidetector CT imaging of the chest, abdomen and pelvis was performed following the standard protocol during bolus administration of intravenous contrast. RADIATION DOSE REDUCTION: This exam was performed according to the departmental dose-optimization program which includes automated exposure control, adjustment of the mA and/or kV according to patient size and/or use of iterative reconstruction technique. CONTRAST:  118mL  OMNIPAQUE IOHEXOL 300 MG/ML  SOLN COMPARISON:  12/17/2021 chest CT, 01/14/2022 abdomen and pelvis CT and other studies. FINDINGS: CT CHEST FINDINGS Cardiovascular: Heart size is normal. Heavy coronary artery atherosclerotic calcifications and evidence of CABG noted. Aortic atherosclerotic calcifications noted without evidence of thoracic aortic aneurysm. No pericardial effusion. Mediastinum/Nodes: Enlarging pretracheal, RIGHT paratracheal, RIGHT hilar and subcarinal lymph nodes are identified with 1.3 cm RIGHT paratracheal node (series 3: Image 12) previously measuring 1 cm, and 2 cm pretracheal node (3:21) previously measuring 1.2 cm. Visualized thyroid, trachea and esophagus are unremarkable. Lungs/Pleura: Innumerable pulmonary nodules within both lungs have increased in size and number compatible with metastatic disease. Index LEFT UPPER lobe nodule now measures 1.3 x 1.6 cm (4:55) previously 0.8 x 1.2 cm. Increasing streaky and confluent opacities within the RIGHT LOWER lobe noted. A small RIGHT basilar loculated effusion is again noted. There is no evidence of pneumothorax. Musculoskeletal: Irregularity of the posterior RIGHT 11th and 12th ribs are again noted. New irregularity/fracture of the posterior RIGHT 9th rib is present. CT ABDOMEN PELVIS FINDINGS Hepatobiliary: A few tiny low-density hypodense lesions within the liver are unchanged. No definite gallbladder abnormality identified. There is no evidence of intrahepatic or extrahepatic biliary dilatation. Pancreas: Unremarkable Spleen: Unremarkable Adrenals/Urinary Tract: The kidneys are unremarkable except for a punctate nonobstructing RIGHT UPPER pole renal calculus and a stable LEFT renal cyst which needs no follow-up. The adrenal glands are unremarkable. No significant bladder abnormality identified. Stomach/Bowel: Fluid within the colon is noted and may represent a diarrheal state. No definite bowel wall thickening is present. There is no evidence of  bowel obstruction or pneumoperitoneum. Vascular/Lymphatic: A 1.5 cm aortocaval node (3:76) previously measured 1.1 cm in short axis. Other shotty retroperitoneal lymph nodes are identified. Aortic atherosclerotic calcifications noted without evidence of aneurysm. Reproductive: Prostate is unremarkable. Other: No ascites, focal collection or pneumoperitoneum. Musculoskeletal: No acute or suspicious bony abnormalities are noted. IMPRESSION: 1. Increasing metastatic disease with increasing size and number of pulmonary nodules, and enlarging mediastinal, RIGHT hilar and aorto caval lymph nodes. 2. Increasing streaky and confluent opacities within the RIGHT LOWER lobe which may represent atelectasis, tumor or infection. Unchanged small loculated RIGHT basilar pleural effusion.  3. Unchanged irregularity of the posterior RIGHT 11th and 12th ribs with new irregularity/fracture of the posterior RIGHT 9th rib. Question metastatic disease or postprocedural/posttraumatic changes. 4. Fluid within the colon which may represent a diarrheal state. 5. Aortic Atherosclerosis (ICD10-I70.0). Electronically Signed   By: Margarette Canada M.D.   On: 03/14/2022 16:34   CT Head Wo Contrast  Result Date: 03/14/2022 CLINICAL DATA:  Altered mental status. Confusion and delusions since noon. 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 EXAM: CT HEAD WITHOUT CONTRAST TECHNIQUE: Contiguous axial images were obtained from the base of the skull through the vertex without intravenous contrast. RADIATION DOSE REDUCTION: This exam was performed according to the departmental dose-optimization program which includes automated exposure control, adjustment of the mA and/or kV according to patient size and/or use of iterative reconstruction technique. COMPARISON:  MR head without and with contrast 01/26/2022 FINDINGS: Brain: Focal hypoattenuation in the left cerebellum and high anterior right frontal  lobe corresponds with edema about known lesions. Mild scattered white matter hypoattenuation scratched at mild diffuse white matter hypoattenuation is otherwise stable. No acute infarct or hemorrhage is present. No new areas of edema present to suggest significant progression of disease. The ventricles are of normal size. No significant extraaxial fluid collection is present. Vascular: Atherosclerotic calcifications are present within the cavernous internal carotid arteries. No hyperdense vessel is present. Skull: Calvarium is intact. No focal lytic or blastic lesions are present. No significant extracranial soft tissue lesion is present. Sinuses/Orbits: The paranasal sinuses and mastoid air cells are clear. The globes and orbits are within normal limits. IMPRESSION: 1. No acute intracranial abnormality. 2. Stable appearance of edema about known lesions in the left cerebellum and high anterior right frontal lobe. 3. No new areas of edema to suggest significant progression of disease. 4. Atherosclerosis. Electronically Signed   By: San Morelle M.D.   On: 03/14/2022 16:33   DG Chest Portable 1 View  Result Date: 03/14/2022 CLINICAL DATA:  Altered mental status. Fever. Stage IV lung cancer. EXAM: PORTABLE CHEST 1 VIEW COMPARISON:  01/14/2022 and prior studies FINDINGS: Unchanged cardiomediastinal silhouette with CABG changes again noted. RIGHT IJ central venous catheter with tip overlying the cavoatrial junction again noted. Scattered nodules within both lungs are again identified compatible with metastatic disease, better visualized with CT. No definite airspace disease identified. A small RIGHT pleural effusion with mild RIGHT basilar atelectasis noted. There is no evidence of pneumothorax. IMPRESSION: 1. Small RIGHT pleural effusion with mild RIGHT basilar atelectasis. 2. Bilateral pulmonary nodules/metastatic disease again noted. Electronically Signed   By: Margarette Canada M.D.   On: 03/14/2022 15:32      Future Appointments  Date Time Provider [place]  03/16/2022  8:30 AM [facility] 2 [facility] [place]  03/17/2022 10:00 AM [facility] [place] FLUSH [facility] None  03/17/2022 10:30 AM Heilingoetter, Cassandra L, PA-C [facility] None  03/17/2022 11:00 AM Pickenpack-Cousar, Carlena Sax, NP [facility] None  03/17/2022 11:30 AM [facility] INFUSION [facility] None  03/17/2022  2:30 PM Morrell Riddle, RD [facility] None  03/22/2022 11:00 AM [facility] NURSE [facility]  04/05/2022  8:30 AM [facility] TREATMENT [facility] None  04/07/2022  9:15 AM [facility] [place] FLUSH [facility] None  04/07/2022  9:45 AM Curt Bears, MD [facility] None  04/07/2022 10:30 AM [facility] INFUSION [facility] None  04/29/2022 10:00 AM [facility] LAB [facility] None  04/29/2022 10:30 AM Heilingoetter, Cassandra L, PA-C [facility] None  04/29/2022 11:30 AM [facility] INFUSION [facility] None  05/20/2022  9:00  AM [facility] LAB [facility] None  05/20/2022  9:30 AM Curt Bears, MD [facility] None  05/20/2022 10:30 AM [facility] INFUSION [facility] None  06/11/2022 11:00 AM [name], MD [facility]      LOS: 1 day   ADDENDUM: Hematology/Oncology Attending: I had a face-to-face encounter with the patient today.  I reviewed his record, lab, scan and recommended his care plan.  I agree with the above note.  This is a very pleasant 66 years old white male with a stage IV large cell neuroendocrine carcinoma presented with right upper lobe lung nodule in addition to hilar adenopathy initially diagnosed in April 2022 status post right upper lobectomy.  The patient was found to have metastatic small brain lesion in addition to local recurrence disease in November 2022 in the chest after adjuvant systemic chemotherapy with cisplatin and Alimta for 4 cycles.  He also developed for more brain metastasis and treated with SRS in December 2022.  The patient also received palliative  radiotherapy to the right chest wall completed September 19, 2021.  He developed more brain metastasis and received additional SRS to the brain tumor. The patient started systemic chemotherapy initially with carboplatin, Alimta and Keytruda for 2 cycles with initial partial response.  He started maintenance treatment with Alimta and Keytruda for 5 more cycles. He has been tolerating the treatment well except for fatigue. He was admitted to the hospital with mental status change.  He had CT scan of the head without contrast that showed no acute intracranial abnormality and stable appearance of the edema about the known lesions in the left cerebellum and high anterior right frontal lobe.  He also had CT scan of the chest, abdomen and pelvis performed yesterday and it showed evidence for disease progression with increasing size and number of pulmonary nodules as well as enlarging right hilar, mediastinal and aortocaval lymphadenopathy. I had a lengthy discussion with the patient and his wife and daughter Ria Comment who we are available at the bedside. I gave the patient several options for management of his condition including palliative care and hospice referral versus consideration of second line systemic chemotherapy with docetaxel and Cyramza but I warned the patient about the significant adverse effect of this treatment including but not limited to alopecia, myelosuppression, nausea and vomiting, peripheral neuropathy, liver or renal dysfunction as well as a hemorrhagic adverse effect from [place] versus consideration of enrollment in a clinical trial according to the [facility] clinical trial with cobolimab / Dosartlimab plus minus docetaxel.  Versus referral back to Dr. Durenda Hurt at [name] for reevaluation and see if he has any other treatment options. The patient and his family would like some time to think about these options but they would like to proceed with the referral to Dr. Durenda Hurt to see him  again.  He has seen him in the past. I already contacted the clinical research nurse to check his eligibility criteria for the [facility] protocol.  In the meantime we will refer him back to Dr. Durenda Hurt. I will cancel his upcoming chemotherapy treatment but I will be happy to see the patient back for follow-up visit after his visit with Dr. Durenda Hurt. The patient and his family are in agreement with the current plan. Thank you so much for taking good care of Mr. Biermann.  Please call if you have any additional questions. Disclaimer: This note was dictated with voice recognition software. Similar sounding words can inadvertently be transcribed and may be missed upon review. Eilleen Kempf,  MD

## 2022-03-15 NOTE — Assessment & Plan Note (Addendum)
Suspect multifactorial from edema surrounding known metastatic brain lesions, opioid withdrawal and dehydration. -Patient with metastatic brain cancer on high doses of opioids and benzodiazepine for pain and anxiety.  These include 8 mg Dilaudid q8hr, MS Contin 60mg  q12, ativan 1mg  q8hr.  He is followed by palliative pain management and in his last follow-up in June they were undergoing slow taper.  Daughter knows that he has taken only 2 Dilaudid pills in the past for 72 hours from a pill count, but is unclear whether he has been taking the rest of his medications.  UDS is positive for opioids but daughter gave him a dose of Dilaudid prior to presenting to ED. He had signs of tremors, nausea and diarrhea over the weekend concerning for withdrawal symptoms.  This then led to his dehydration and AKI. -will give IV 8mg  of decadron for edema seen on known CT head lesions -continuous IVF for AKI -Will continue MS contin 60mg  q12 and hold PRN Dilaudid for now since blood pressure are borderline soft  -also obtain MRI brain with and without contrast -macrocytic anemia is noted-check Vitamin M08 and folic  -He was started on IV abx in ED due to SIRS criteria although low suspicion without a clear source.CT chest had a questionable opacity in right lower lobe. However, given he has leukopenia and is immunocompromised, will continue IV cefepime and flagyl pending blood cultures. Also get GI panel for diarrhea.

## 2022-03-15 NOTE — Telephone Encounter (Signed)
ED notes and request faxed to see Dr. Durenda Hurt  again.

## 2022-03-15 NOTE — Assessment & Plan Note (Signed)
Large B cell neuroendocrine carcinoma with right upper lobe lung nodule, right hilar adenopathy in April 2022.  Also found to have brain metastasis in May 2022. -S/p right upper lobectomy and underwent systemic chemotherapy later discontinued due to intolerance. Underwent palliative radiation. -For brain metastasis he underwent whole brain irradiation -Currently undergoing systemic chemotherapy with carboplatin, Jaynie Collins -Follows with Dr. [name] and last f/u on 02/19/22 and initally had plans of repeat scans for restaging  - CT Chest/Abd/Pelvis now showing increased metastatic disease with increasing size and number of pulmonary nodules, enlarging mediastinal right hilar and aortovaval lymph nodes.  Increasing streaky and opacity in the right lower lobe that could be atelectasis, tumor or infection. -CT head with stable edema around known lesions

## 2022-03-15 NOTE — Assessment & Plan Note (Signed)
Creatinine elevated to 1.11 from 0.97 -on IV fluids. Monitor with repeat tomorrow.  -avoid nephrotoxic agents

## 2022-03-15 NOTE — Progress Notes (Signed)
PROGRESS NOTE    [name]  [name]:[id number] DOB: 08/01/1956 DOA: 03/14/2022 PCP: [name], MD   Brief Narrative: HPI per [name] is a 66 y.o. 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   Daughter provides hx over the phone as pt is completely disoriented. She just moved in with him this week and was planning on help more with his care.    About 3 days ago he ran out of Dilaudid briefly and daughter thinks that after he had them filled he made a conscious decision to quit taking them on his own.  He had symptoms of shivering, nausea and diarrhea 2 days ago.  Decrease fluid intake.  Then today around noon he had an acute change in his mental status and became completely disoriented.  He normally is alert and oriented x4 at baseline.  Daughter decided to count his Dilaudid pills and noticed he had only taken 2 pills in the past 72 hours although it is prescribed for 3 times daily PRN. She immediately gave him one Dilaudid pill prior to coming to ED.  She reports that he has been dealing with on and off opioid use for decades due to issues with pain. He also has MS contin 46m q12hr and ativan [id number] on his list. She is unsure how or if he has been taking these. She is also concerned about stroke since he hx of TIA but no focal weakness that she has noticed. Unsure if he took Xarelto for his atrial fibrillation.    In the ED, he was afebrile tachycardic tachypneic and normotensive on room air. Has leukopenia and elevated lactate of 3.3. Creatinine was elevated 1.11 from prior of 0.97. UA was negative. UDS positive for opioids.   CT head was negative with stable appearing edema  known brain lesion. CT abdomen pelvis showed worsening metastatic disease in the lungs and increased streaky and confluent opacity in the right lower lobe that could be atelectasis, tumor versus infection.   No obvious source of infection was found but given SIRS criteria.  He was started on Zosyn, vancomycin and cefepime by ED PA.  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  He had diarrhea at home. Patient was admitted with nausea and diarrhea and generalized weakness  His p.o. intake has been limited prior to admission. Very unlikely to be opioid withdrawal as his mental status had improved since admission without getting opioids. His pain is managed by palliative care.  Will consult them. It appears to be that he is taking Dilaudid 8 mg every 8 hours and MS Contin 60 mg every 12 and Ativan 1 mg every 8 hours. UDS positive for opioids. MRI from May 2023 progression of disease.  15-16 small new brain mets since February.  The largest 1 is 5 mm.  To  slightly larger treated metastasis.  He had some surrounding edema to the mets. He received Decadron 8 mg on admission.  This probably improved his mental status. He was started on cefepime and Flagyl on admission for he met SIRS criteria.  However no localized signs of infection noted so far his diarrhea has resolved.  #2 anemia hemoglobin down to 6.8 on admission.  He was given 1 units of blood transfusion.  He was on Xarelto at home which is on hold due to anemia.  #3 leukopenia-likely secondary to chemo  #4 paroxysmal atrial fibrillation rate  controlled.  Xarelto on hold due to anemia and blood transfusion.  #5 AKI on CKD stage IIIa improving continue IV fluids slow.  #6 malignant poorly differentiated neuroendocrine carcinoma-he had right upper lobectomy and systemic chemotherapy and palliative radiation.  He had whole brain radiation for brain mets.  And is currently undergoing systemic chemotherapy.  He sees Dr. [name]. CT of the chest abdomen and pelvis with increased metastatic disease with increase in size and number of the pulmonary nodules enlarging right hilar lymph nodes.  Increasing opacity to the right lower lobe could be atelectasis tumor or infection.  CT head with stable edema around known lesions. Oncology consulted.   Estimated body mass index is 22.36 kg/m as calculated from the following:   Height as of this encounter: _0  (1.753 m).   Weight as of this encounter: 68.7 kg.  DVT prophylaxis: SCD  code Status: DNR  family Communication: Daughter and wife at bedside  disposition Plan:  Status is: Inpatient Remains inpatient appropriate because: Acute change in mental status encephalopathy   Consultants:  Oncology  Procedures: None Antimicrobials: Cefepime and Flagyl Subjective: Patient sitting up in bed family by the bedside family reports that he is much more awake and alert than earlier.  However he is not yet back to his baseline.   Objective: Vitals:   03/15/22 0041 03/15/22 0533 03/15/22 0745 03/15/22 0810  BP: 99/66 98/62 100/62 (!) 95/50  Pulse: 78 65 70 88  Resp:   15 14  Temp: 98.4 F (36.9 C) 98.1 F (36.7 C) 98.2 F (36.8 C) 98.2 F (36.8 C)  TempSrc: Oral Oral Oral Oral  SpO2: 99% 100% 100% 99%  Weight:  68.7 kg    Height:        Intake/Output Summary (Last 24 hours) at 03/15/2022 1118 Last data filed at 03/15/2022 0656 Gross per 24 hour  Intake 3447.97 ml  Output 0 ml  Net 3447.97 ml   Filed Weights   03/14/22 1431 03/15/22 0533  Weight: 70.3 kg 68.7 kg     Examination:  General exam: Appears in no acute distress Respiratory system: Clear to auscultation. Respiratory effort normal. Cardiovascular system: S1 & S2 heard, RRR. No JVD, murmurs, rubs, gallops or clicks. No pedal edema. Gastrointestinal system: Abdomen is nondistended, soft and nontender. No organomegaly or masses felt. Normal bowel sounds heard. Central nervous system: Awake alert and oriented. No focal neurological deficits. Extremities: No edema  skin: No rashes, lesions or ulcers Psychiatry: Judgement and insight appear normal. Mood & affect appropriate.     Data Reviewed: I have personally reviewed following labs and imaging studies  CBC: Recent Labs  Lab 03/14/22 1448 03/15/22 0118  WBC 3.8* 3.2*  NEUTROABS 2.5  --   HGB 9.6* 6.8*  HCT 28.9* 20.7*  MCV 100.7* 102.5*  PLT 213 782   Basic Metabolic Panel: Recent Labs  Lab 03/14/22 1448 03/15/22 0118  NA  136 138  K 3.5 3.5  CL 101 106  CO2 20* 23  GLUCOSE 105* 105*  BUN 13 13  CREATININE 1.11 0.91  CALCIUM 9.8 8.4*   GFR: Estimated Creatinine Clearance: 77.6 mL/min (by C-G formula based on SCr of 0.91 mg/dL). Liver Function Tests: Recent Labs  Lab 03/14/22 1448  AST 50*  ALT 52*  ALKPHOS 106  BILITOT 1.0  PROT 8.1  ALBUMIN 3.4*   No results for input(s): "LIPASE", "AMYLASE" in the last 168 hours. Recent Labs  Lab 03/15/22 0118  AMMONIA 8*   Coagulation Profile: Recent Labs  Lab 03/14/22 1448  INR 1.8*   Cardiac Enzymes: No results for input(s): "CKTOTAL", "CKMB", "CKMBINDEX", "TROPONINI" in the last 168 hours. BNP (last 3 results) No results for input(s): "PROBNP" in the last 8760 hours. HbA1C: No results for input(s): "HGBA1C" in the last 72 hours. CBG: No results for input(s): "GLUCAP" in the last 168 hours. Lipid Profile: No results for input(s): "CHOL", "HDL", "LDLCALC", "TRIG", "CHOLHDL", "LDLDIRECT" in the last 72 hours. Thyroid Function Tests: No results for  input(s): "TSH", "T4TOTAL", "FREET4", "T3FREE", "THYROIDAB" in the last 72 hours. Anemia Panel: Recent Labs    03/15/22 0118  VITAMINB12 374  FOLATE 35.7   Sepsis Labs: Recent Labs  Lab 03/14/22 1448 03/14/22 1730 03/15/22 0118 03/15/22 0440  LATICACIDVEN 3.5* 3.3* 0.8 0.9    Recent Results (from the past 240 hour(s))  Culture, blood (Routine x 2)     Status: None (Preliminary result)   Collection Time: 03/14/22  2:49 PM   Specimen: Right Antecubital; Blood  Result Value Ref Range Status   Specimen Description   Final    RIGHT ANTECUBITAL BLOOD Performed at [facility], [address] [place], [place] [address]    Special Requests   Final    Blood Culture adequate volume BOTTLES DRAWN AEROBIC AND ANAEROBIC Performed at [facility], [address] [place], [place] [address]    Culture   Final    NO GROWTH < 12 HOURS Performed at [facility], [place] [address], [place], [place] [address]    Report Status PENDING  Incomplete  Culture, blood (Routine x 2)     Status: None (Preliminary result)   Collection Time: 03/14/22  3:10 PM   Specimen: Left Antecubital; Blood  Result Value Ref Range Status   Specimen Description   Final    LEFT ANTECUBITAL BLOOD Performed at [facility], [address] [place], [place] [address]    Special Requests   Final    Blood Culture adequate volume BOTTLES DRAWN AEROBIC AND ANAEROBIC Performed at [facility], [address] [place], [place] [address]    Culture   Final    NO GROWTH < 12 HOURS Performed at [facility], [place] [address], [place], [place] [address]    Report Status PENDING  Incomplete  SARS Coronavirus 2 by RT PCR (hospital order, performed in [facility] hospital lab) *cepheid single result test* Anterior Nasal Swab     Status: None   Collection Time: 03/14/22  6:22 PM   Specimen: Anterior Nasal Swab  Result Value Ref Range Status   SARS Coronavirus 2 by RT PCR  NEGATIVE NEGATIVE Final    Comment: (NOTE) SARS-CoV-2 target nucleic acids are NOT DETECTED.  The SARS-CoV-2 RNA is generally detectable in upper and lower respiratory specimens during the acute phase of infection. The lowest concentration  of SARS-CoV-2 viral copies this assay can detect is 250 copies / mL. A negative result does not preclude SARS-CoV-2 infection and should not be used as the sole basis for treatment or other patient management decisions.  A negative result may occur with improper specimen collection / handling, submission of specimen other than nasopharyngeal swab, presence of viral mutation(s) within the areas targeted by this assay, and inadequate number of viral copies (<250 copies / mL). A negative result must be combined with clinical observations, patient history, and epidemiological information.  Fact Sheet for Patients:   https://www.patel.info/  Fact Sheet for Healthcare Providers: https://hall.com/  This test is not yet approved or  cleared by the [place] FDA and has been authorized for detection and/or diagnosis of SARS-CoV-2 by FDA under an Emergency Use Authorization (EUA).  This EUA will remain in effect (meaning this test can be used) for the duration of the COVID-19 declaration under Section 564(b)(1) of the Act, 21 U.S.C. section 360bbb-3(b)(1), unless the authorization is terminated or revoked sooner.  Performed at [facility], [address], [place], [place] [address]          Radiology Studies: CT CHEST ABDOMEN PELVIS W CONTRAST  Result Date: 03/14/2022 CLINICAL DATA:  66 year old male with acute chest, abdominal and pelvic pain. History of non-small cell lung cancer. EXAM: CT CHEST, ABDOMEN, AND PELVIS WITH CONTRAST TECHNIQUE: Multidetector CT imaging of the chest, abdomen and pelvis was performed following the standard protocol during bolus administration of intravenous  contrast. RADIATION DOSE REDUCTION: This exam was performed according to the departmental dose-optimization program which includes automated exposure control, adjustment of the mA and/or kV according to patient size and/or use of iterative reconstruction technique. CONTRAST:  170m OMNIPAQUE IOHEXOL 300 MG/ML  SOLN COMPARISON:  12/17/2021 chest CT, 01/14/2022 abdomen and pelvis CT and other studies. FINDINGS: CT CHEST FINDINGS Cardiovascular: Heart size is normal. Heavy coronary artery atherosclerotic calcifications and evidence of CABG noted. Aortic atherosclerotic calcifications noted without evidence of thoracic aortic aneurysm. No pericardial effusion. Mediastinum/Nodes: Enlarging pretracheal, RIGHT paratracheal, RIGHT hilar and subcarinal lymph nodes are identified with 1.3 cm RIGHT paratracheal node (series 3: Image 12) previously measuring 1 cm, and 2 cm pretracheal node (3:21) previously measuring 1.2 cm. Visualized thyroid, trachea and esophagus are unremarkable. Lungs/Pleura: Innumerable pulmonary nodules within both lungs have increased in size and number compatible with metastatic disease. Index LEFT UPPER lobe nodule now measures 1.3 x 1.6 cm (4:55) previously 0.8 x 1.2 cm. Increasing streaky and confluent opacities within the RIGHT LOWER lobe noted. A small RIGHT basilar loculated effusion is again noted. There is no evidence of pneumothorax. Musculoskeletal: Irregularity of the posterior RIGHT 11th and 12th ribs are again noted. New irregularity/fracture of the posterior RIGHT 9th rib is present. CT ABDOMEN PELVIS FINDINGS Hepatobiliary: A few tiny low-density hypodense lesions within the liver are unchanged. No definite gallbladder abnormality identified. There is no evidence of intrahepatic or extrahepatic biliary dilatation. Pancreas: Unremarkable Spleen: Unremarkable Adrenals/Urinary Tract: The kidneys are unremarkable except for a punctate nonobstructing RIGHT UPPER pole renal calculus and a  stable LEFT renal cyst which needs no follow-up. The adrenal glands are unremarkable. No significant bladder abnormality identified. Stomach/Bowel: Fluid within the colon is noted and may represent a diarrheal state. No definite bowel wall thickening is present. There is no evidence of bowel obstruction or pneumoperitoneum. Vascular/Lymphatic: A 1.5 cm aortocaval node (3:76) previously measured 1.1 cm in short axis. Other shotty retroperitoneal lymph nodes are identified. Aortic  atherosclerotic calcifications noted without evidence of aneurysm. Reproductive: Prostate is unremarkable. Other: No ascites, focal collection or pneumoperitoneum. Musculoskeletal: No acute or suspicious bony abnormalities are noted. IMPRESSION: 1. Increasing metastatic disease with increasing size and number of pulmonary nodules, and enlarging mediastinal, RIGHT hilar and aorto caval lymph nodes. 2. Increasing streaky and confluent opacities within the RIGHT LOWER lobe which may represent atelectasis, tumor or infection. Unchanged small loculated RIGHT basilar pleural effusion. 3. Unchanged irregularity of the posterior RIGHT 11th and 12th ribs with new irregularity/fracture of the posterior RIGHT 9th rib. Question metastatic disease or postprocedural/posttraumatic changes. 4. Fluid within the colon which may represent a diarrheal state. 5. Aortic Atherosclerosis (ICD10-I70.0). Electronically Signed   By: Margarette Canada M.D.   On: 03/14/2022 16:34   CT Head Wo Contrast  Result Date: 03/14/2022 CLINICAL DATA:  Altered mental status. Confusion and delusions since noon. 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 EXAM: CT HEAD WITHOUT CONTRAST TECHNIQUE: Contiguous axial images were obtained from the base of the skull through the vertex without intravenous contrast. RADIATION DOSE REDUCTION: This exam was performed according to the departmental dose-optimization program which includes  automated exposure control, adjustment of the mA and/or kV according to patient size and/or use of iterative reconstruction technique. COMPARISON:  MR head without and with contrast 01/26/2022 FINDINGS: Brain: Focal hypoattenuation in the left cerebellum and high anterior right frontal lobe corresponds with edema about known lesions. Mild scattered white matter hypoattenuation scratched at mild diffuse white matter hypoattenuation is otherwise stable. No acute infarct or hemorrhage is present. No new areas of edema present to suggest significant progression of disease. The ventricles are of normal size. No significant extraaxial fluid collection is present. Vascular: Atherosclerotic calcifications are present within the cavernous internal carotid arteries. No hyperdense vessel is present. Skull: Calvarium is intact. No focal lytic or blastic lesions are present. No significant extracranial soft tissue lesion is present. Sinuses/Orbits: The paranasal sinuses and mastoid air cells are clear. The globes and orbits are within normal limits. IMPRESSION: 1. No acute intracranial abnormality. 2. Stable appearance of edema about known lesions in the left cerebellum and high anterior right frontal lobe. 3. No new areas of edema to suggest significant progression of disease. 4. Atherosclerosis. Electronically Signed   By: San Morelle M.D.   On: 03/14/2022 16:33   DG Chest Portable 1 View  Result Date: 03/14/2022 CLINICAL DATA:  Altered mental status. Fever. Stage IV lung cancer. EXAM: PORTABLE CHEST 1 VIEW COMPARISON:  01/14/2022 and prior studies FINDINGS: Unchanged cardiomediastinal silhouette with CABG changes again noted. RIGHT IJ central venous catheter with tip overlying the cavoatrial junction again noted. Scattered nodules within both lungs are again identified compatible with metastatic disease, better visualized with CT. No definite airspace disease identified. A small RIGHT pleural effusion with mild  RIGHT basilar atelectasis noted. There is no evidence of pneumothorax. IMPRESSION: 1. Small RIGHT pleural effusion with mild RIGHT basilar atelectasis. 2. Bilateral pulmonary nodules/metastatic disease again noted. Electronically Signed   By: Margarette Canada M.D.   On: 03/14/2022 15:32        Scheduled Meds:  Chlorhexidine Gluconate Cloth  6 each Topical Daily   DULoxetine  30 mg Oral BID   morphine  60 mg Oral Q12H   sodium chloride flush  10-40 mL Intracatheter Q12H   Continuous Infusions:  ceFEPime (MAXIPIME) IV 2 g (03/15/22 0854)   lactated ringers 125 mL/hr at 03/15/22 0350   metronidazole 500 mg (  03/15/22 0629)     LOS: 1 day    Time spent: 38 minutes  [name], MD 03/15/2022, 11:18 AM

## 2022-03-15 NOTE — Consult Note (Signed)
Palliative Care Consult Note                                  Date: 03/15/2022   Patient Name: [name]  DOB: 1956-05-10  MRN: [id number]  Age / Sex: 66 y.o., male  PCP: [name], MD Referring Physician: [name], MD  Reason for Consultation: {Reason for Consult:23484}  HPI/Patient Profile: Palliative Care consult requested for goals of care discussion in this 66 y.o. male  with past medical history of *** admitted on 03/14/2022 with ***.   Past Medical History:  Diagnosis Date   Anemia    Anxiety    Arthritis    Basal cell carcinoma (BCC) of forehead    CAD (coronary artery disease)    a. 10/2015 ant STEMI >> LHC with 3 v CAD; oLAD tx with POBA >> emergent CABG. b. Multiple evals since that time, early graft failure of SVG-RCA by cath 03/2016. c. 2/19 PCI/DES x1 to pRCA, normal EF.   Carotid artery disease ([place])    a. 40-59% BICA 02/2018.   Depression    Dyspnea    Ectopic atrial tachycardia (HCC)    Esophageal reflux    eosinophil esophagitis   Family history of adverse reaction to anesthesia    "sister has PONV" (06/21/2017)   Former tobacco use    Gout    Hepatitis C    "treated and cured" (06/21/2017)   High cholesterol    History of blood transfusion    History of kidney stones    Hypertension    Ischemic cardiomyopathy    a. EF 25-30% at intraop TEE 4/17  //  b. Limited Echo 5/17 - EF 45-50%, mild ant HK. c. EF 55-65% by cath 09/2017.   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 HR dropping into 40s in 02/2016 -> BB reduced.   Stroke [facility]) 10/2016   "small one; sometimes my memory/cognitive issues" (06/21/2017)   Symptomatic hypotension    a. 02/2016 ER visit -> meds reduced.   Syncope    Wears dentures    Wears glasses      Subjective:   This NP Osborne Oman reviewed medical records, received report from team, assessed the patient and then met at the patient's bedside with *** to discuss diagnosis, prognosis, GOC, EOL wishes disposition and options.   Concept of Palliative Care was introduced as specialized medical care for people and their families living with serious illness.  It focuses on providing relief from the symptoms and stress of a serious illness.  The goal is to improve quality of life for both the patient and the family. Values and goals of care important to patient and family were attempted to be elicited.  Created space and opportunity for patient and family to explore state of health prior to admission, thoughts, and feelings.   We discussed His current illness and what it means in the larger context of His on-going co-morbidities. Natural disease trajectory and expectations were discussed.  **verbalized understanding of current illness and co-morbidities.   I discussed the importance of continued conversation with family and their medical providers regarding overall plan of care and treatment options, ensuring decisions are within the context of the patients values and GOCs.  Questions and concerns were addressed.  Hard Choices booklet left for review. The family was encouraged to call with questions or concerns.  PMT will continue to support holistically as needed.  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)  AF (paroxysmal atrial fibrillation) (HCC)  Acute renal failure superimposed on stage 3a chronic kidney disease (HCC)  Leukopenia  Anemia   Scheduled Meds:  Chlorhexidine Gluconate Cloth  6 each Topical Daily   dexamethasone (DECADRON) injection  4 mg Intravenous Daily   DULoxetine  30 mg Oral BID   morphine  60 mg Oral Q12H   sodium chloride flush  10-40 mL Intracatheter Q12H    Continuous Infusions:  ceFEPime  (MAXIPIME) IV 2 g (03/15/22 0854)   lactated ringers 75 mL/hr at 03/15/22 1244   metronidazole 500 mg (03/15/22 0629)    PRN Meds: acetaminophen, HYDROmorphone, ondansetron, mouth rinse, sodium chloride flush  Allergies  Allergen Reactions   Prednisone Other (See Comments)    States that this med makes him "crazy"   Tetanus Toxoids Swelling and Other (See Comments)    Fever, Swelling of the arm    Wellbutrin [Bupropion] Other (See Comments)    Crazy thoughts, nightmares   Varenicline Other (See Comments)    Unpleasant dreams    Review of Systems Unless otherwise noted, a complete review of systems is negative.  Physical Exam General: NAD, frail chronically-ill appearing Cardiovascular: regular rate and rhythm Pulmonary: clear ant fields, diminished bilaterally  Abdomen: soft, nontender, + bowel sounds Extremities: no edema, no joint deformities Skin: no rashes, warm and dry Neurological:   Vital Signs:  BP (!) 90/44 (BP Location: Left Arm)   Pulse 64   Temp 98.4 F (36.9 C) (Oral)   Resp 16   Ht _0  (1.753 m)   Wt 68.7 kg   SpO2 100%   BMI 22.36 kg/m  Pain Scale: 0-10   Pain Score: 5   SpO2: SpO2: 100 % O2 Device:SpO2: 100 % O2 Flow Rate: .   IO: Intake/output summary:  Intake/Output Summary (Last 24 hours) at 03/15/2022 1421 Last data filed at 03/15/2022 1145 Gross per 24 hour  Intake 4107.97 ml  Output 0 ml  Net 4107.97 ml    LBM:   Baseline Weight: Weight: 70.3 kg Most recent weight: Weight: 68.7 kg      Palliative Assessment/Data: ***   Advanced Care Planning:   Primary Decision Maker: {Primary Decision AYOKH:99774}  Code Status/Advance Care Planning: {Palliative Code status:23503}  A discussion was had today regarding advanced directives. Concepts specific to code status, artifical feeding and hydration, continued IV antibiotics and rehospitalization was had.  The difference between a aggressive medical intervention path and a palliative  comfort care path was discussed. ***The MOST form was introduced and discussed.***  Hospice and Palliative Care services outpatient were explained and offered. Patient and family verbalized their understanding and awareness of both palliative and hospice's goals and philosophy of care.   Decisions/Changes to ACP: ***  Assessment & Plan:   SUMMARY OF RECOMMENDATIONS   DNR/DNI/Full Code-as confirmed by  Continue with current plan of care  PMT will continue to support and follow as needed. Please call team line with urgent needs.  Symptom Management:  Per Attending/See below  Palliative Prophylaxis:  {Palliative Prophylaxis:21015}  Additional Recommendations (Limitations, Scope, Preferences): {Recommended Scope and Preferences:21019}  Psycho-social/Spiritual:  Desire for further Chaplaincy support: {YES NO:22349} Additional Recommendations: {PAL SOCIAL:21064}  Prognosis:  {Palliative Care Prognosis:23504}  Discharge Planning:  {Palliative dispostion:23505}   Discussed with:     Patient*** expressed understanding and was in agreement with this plan.   Time In: *** Time Out: *** Time Total: ***  Visit consisted of counseling and education dealing with the complex and emotionally intense issues of symptom management and palliative care in the setting of serious and potentially life-threatening illness.Greater than 50%  of this time was spent counseling and coordinating care related to the above assessment and plan.  Signed by:  Alda Lea, AGPCNP-BC Palliative Medicine Team  Phone: [phone] Pager: [phone] Amion: Bjorn Pippin   Thank you for allowing the Palliative Medicine Team to assist in the care of this patient. Please utilize secure chat with additional questions, if there is no response within 30 minutes please call the above phone number. Palliative Medicine Team providers are available by phone from 7am to 5pm daily and can be reached through  the team cell phone.  Should this patient require assistance outside of these hours, please call the patient's attending physician.

## 2022-03-15 NOTE — Assessment & Plan Note (Signed)
Stable.       - Continue to monitor

## 2022-03-15 NOTE — Assessment & Plan Note (Signed)
Likely secondary to chemotherapy.

## 2022-03-16 ENCOUNTER — Encounter (HOSPITAL_COMMUNITY): Payer: Self-pay

## 2022-03-16 ENCOUNTER — Ambulatory Visit (HOSPITAL_COMMUNITY)
Admission: RE | Admit: 2022-03-16 | Discharge: 2022-03-16 | Disposition: A | Discharge status: Home / Self Care | Payer: Medicare Other | Source: Ambulatory Visit | Attending: Internal Medicine | Admitting: Internal Medicine

## 2022-03-16 ENCOUNTER — Inpatient Hospital Stay (HOSPITAL_COMMUNITY): Payer: Medicare Other

## 2022-03-16 DIAGNOSIS — G9341 Metabolic encephalopathy: Secondary | ICD-10-CM | POA: Diagnosis not present

## 2022-03-16 DIAGNOSIS — C7A1 Malignant poorly differentiated neuroendocrine tumors: Secondary | ICD-10-CM | POA: Diagnosis not present

## 2022-03-16 DIAGNOSIS — R52 Pain, unspecified: Secondary | ICD-10-CM

## 2022-03-16 DIAGNOSIS — R6510 Systemic inflammatory response syndrome (SIRS) of non-infectious origin without acute organ dysfunction: Secondary | ICD-10-CM

## 2022-03-16 DIAGNOSIS — R4182 Altered mental status, unspecified: Secondary | ICD-10-CM | POA: Diagnosis not present

## 2022-03-16 LAB — GASTROINTESTINAL PANEL BY PCR, STOOL (REPLACES STOOL CULTURE)

## 2022-03-16 LAB — BPAM RBC
Blood Product Expiration Date: 202308142359
ISSUE DATE / TIME: 202307170738
Unit Type and Rh: 5100

## 2022-03-16 LAB — BASIC METABOLIC PANEL
Anion gap: 7 (ref 5–15)
BUN: 15 mg/dL (ref 8–23)
CO2: 24 mmol/L (ref 22–32)
Calcium: 8.9 mg/dL (ref 8.9–10.3)
Chloride: 107 mmol/L (ref 98–111)
Creatinine, Ser: 0.7 mg/dL (ref 0.61–1.24)
GFR, Estimated: >60 mL/min (ref >60)
Glucose, Bld: 103 mg/dL — ABNORMAL HIGH (ref 70–99)
Potassium: 3.3 mmol/L — ABNORMAL LOW (ref 3.5–5.1)
Sodium: 138 mmol/L (ref 135–145)

## 2022-03-16 LAB — CBC
HCT: 25.3 % — ABNORMAL LOW (ref 39.0–52.0)
Hemoglobin: 8.4 g/dL — ABNORMAL LOW (ref 13.0–17.0)
MCH: 32.7 pg (ref 26.0–34.0)
MCHC: 33.2 g/dL (ref 30.0–36.0)
MCV: 98.4 fL (ref 80.0–100.0)
Platelets: 145 10*3/uL — ABNORMAL LOW (ref 150–400)
RBC: 2.57 MIL/uL — ABNORMAL LOW (ref 4.22–5.81)
RDW: 19.5 % — ABNORMAL HIGH (ref 11.5–15.5)
WBC: 4.1 10*3/uL (ref 4.0–10.5)
nRBC: 0 % (ref 0.0–0.2)

## 2022-03-16 LAB — TYPE AND SCREEN
ABO/RH(D): O POS
Antibody Screen: NEGATIVE
Unit division: 0

## 2022-03-16 LAB — MAGNESIUM: Magnesium: 1.8 mg/dL (ref 1.7–2.4)

## 2022-03-16 MED ORDER — POTASSIUM CHLORIDE CRYS ER 20 MEQ PO TBCR
40.0000 meq | EXTENDED_RELEASE_TABLET | Freq: Once | ORAL | Status: AC
Start: 2022-03-16 — End: 2022-03-16
  Administered 2022-03-16: 40 meq via ORAL
  Filled 2022-03-16: qty 2

## 2022-03-16 MED ORDER — SODIUM CHLORIDE 0.9 % IV SOLN: INTRAVENOUS | Status: DC

## 2022-03-16 MED ORDER — GADOBUTROL 1 MMOL/ML IV SOLN
7.0000 mL | Freq: Once | INTRAVENOUS | Status: AC | PRN
Start: 2022-03-16 — End: 2022-03-16
  Administered 2022-03-16: 7 mL via INTRAVENOUS

## 2022-03-16 NOTE — TOC Progression Note (Signed)
Transition of Care [facility]) - Progression Note    Patient Details  Name: [name] MRN: [id number] Date of Birth: Feb 14, 1956  Transition of Care [facility]) CM/SW Contact  Lennart Pall, LCSW Phone Number: 03/16/2022, 1:45 PM  Clinical Narrative:     Left another VM for pt's wife to discuss potential needs for home - await return call.  Expected Discharge Plan: [place] Barriers to Discharge: Continued Medical Work up  Expected Discharge Plan and Services Expected Discharge Plan: [place] Choice: Durable Medical Equipment, Home Health Living arrangements for the past 2 months: Single Family Home                                       Social Determinants of Health (SDOH) Interventions    Readmission Risk Interventions    08/21/2021    9:25 AM 08/01/2021    8:41 AM 03/25/2021   10:13 AM  Readmission Risk Prevention Plan  Transportation Screening Complete Complete Complete  Medication Review [employer] photographer) Complete Complete Complete  PCP or Specialist appointment within 3-5 days of discharge Complete Complete Complete  HRI or Home Care Consult Complete Complete Complete  SW Recovery Care/Counseling Consult Complete Complete Complete  SW Consult Not Complete Comments n/a    Palliative Care Screening Not Applicable Complete Not [place] Not Applicable Not Applicable Not Applicable

## 2022-03-16 NOTE — Progress Notes (Signed)
PROGRESS NOTE    [name]  BEM:[id number] DOB: 1956/07/09 DOA: 03/14/2022 PCP: [name], MD   Brief Narrative: HPI per [name] is a 66 y.o. 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   Daughter provides hx over the phone as pt is completely disoriented. She just moved in with him this week and was planning on help more with his care.    About 3 days ago he ran out of Dilaudid briefly and daughter thinks that after he had them filled he made a conscious decision to quit taking them on his own.  He had symptoms of shivering, nausea and diarrhea 2 days ago.  Decrease fluid intake.  Then today around noon he had an acute change in his mental status and became completely disoriented.  He normally is alert and oriented x4 at baseline.  Daughter decided to count his Dilaudid pills and noticed he had only taken 2 pills in the past 72 hours although it is prescribed for 3 times daily PRN. She immediately gave him one Dilaudid pill prior to coming to ED.  She reports that he has been dealing with on and off opioid use for decades due to issues with pain. He also has MS contin 54m q12hr and ativan [id number] on his list. She is unsure how or if he has been taking these. She is also concerned about stroke since he hx of TIA but no focal weakness that she has noticed. Unsure if he took Xarelto for his atrial fibrillation.    In the ED, he was afebrile tachycardic tachypneic and normotensive on room air. Has leukopenia and elevated lactate of 3.3. Creatinine was elevated 1.11 from prior of 0.97. UA was negative. UDS positive for opioids.   CT head was negative with stable appearing edema  known brain lesion. CT abdomen pelvis showed worsening metastatic disease in the lungs and increased streaky and confluent opacity in the right lower lobe that could be atelectasis, tumor versus infection.   No obvious source of infection was found but given SIRS criteria.  He was started on Zosyn, vancomycin and cefepime by ED PA.  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  He had diarrhea at home.  He apparently took laxatives and stool softeners.  But he had no diarrhea till this morning since hospital admission.  He continues to be nauseous.  His p.o. intake is poor. His pain is managed by palliative care as an outpatient and appreciate their input. On Dilaudid 8 mg every 6 hours as needed MS Contin 60 mg twice a day Cymbalta 30 mg twice a day Zofran 8 mg every 8 as needed Tylenol as needed CT scan of brain 03/16/2022-substantially decreased metastatic burden with 3 residual stable or smaller lesions.  No acute abnormality. CT of the chest abdomen  and pelvis 03/15/2022-increasing metastatic disease with increasing size and number of pulmonary nodules and enlarging mediastinal, right hilar and aortocaval lymph nodes.  Increasing streaky and confluent opacities within the right lower lobe.  New irregularity in the 11th and 12th ribs possible metastatic disease.  He received Decadron 8 mg on admission.  This probably improved his mental status.  We will continue Decadron 4 mg daily. He was started on cefepime and Flagyl on admission for he met SIRS criteria.  However no localized signs of  infection noted so far will DC antibiotics and monitor.  #2 anemia hemoglobin down to 6.8 on admission.  He was given 1 units of blood transfusion.  Restart Xarelto.  No signs of active bleed.  #3 leukopenia-likely secondary to chemo resolved.  #4 paroxysmal atrial fibrillation rate controlled.  Xarelto on hold due to anemia and blood transfusion.  #5 AKI on CKD stage IIIa improving continue IV fluids slow.  #6 malignant poorly differentiated neuroendocrine carcinoma-he had right upper lobectomy and systemic chemotherapy and palliative radiation.  He had whole brain radiation for brain mets.  And is currently undergoing systemic chemotherapy.  He sees Dr. [name]. CT of the chest abdomen and pelvis with increased metastatic disease with increase in size and number of the pulmonary nodules enlarging right hilar lymph nodes.  Increasing opacity to the right lower lobe could be atelectasis tumor or infection.  CT head with stable edema around known lesions. Oncology consulted.  They are arranging follow-up at [facility] with Dr. Durenda Hurt and enrolling him in a clinical trial.  #7 diarrhea check stool C. difficile and GI pathogen panel. Normal saline 75 cc an hour. Blood pressure is soft 113/70   Estimated body mass index is 22.36 kg/m as calculated from the following:   Height as of this encounter: _0  (1.753 m).   Weight as of this encounter: 68.7 kg.  DVT prophylaxis: SCD  code Status: DNR  family Communication: None at bedside today  disposition Plan:  Status is: Inpatient Remains inpatient appropriate because: Acute change in mental status encephalopathy   Consultants:  Oncology Palliative care  Procedures: None Antimicrobials: None  subjective: He is reporting he started to have diarrhea this morning.  Had a large loose BM this morning complains of nausea not able to eat lightheaded on standing up  Objective: Vitals:   03/15/22 1206 03/15/22 2028 03/16/22 0505 03/16/22 1230   BP: (!) 90/44 108/60 127/72 113/70  Pulse: 64 80 70 71  Resp: _1 Temp: 98.4 F (36.9 C) 98.7 F (37.1 C) 98.2 F (36.8 C) 98.2 F (36.8 C)  TempSrc: Oral Oral Oral Oral  SpO2: 100% 100% 100% 100%  Weight:      Height:        Intake/Output Summary (Last 24 hours) at 03/16/2022 1300 Last data filed at 03/16/2022 0807 Gross per 24 hour  Intake 2101.06 ml  Output 400 ml  Net 1701.06 ml    Filed Weights   03/14/22 1431 03/15/22 0533  Weight: 70.3 kg 68.7 kg    Examination:  General exam: Appears in no acute distress, ill-appearing Respiratory system: Clear to auscultation. Respiratory effort normal. Cardiovascular system: S1 & S2 heard, RRR. No JVD, murmurs, rubs, gallops or clicks. No pedal edema. Gastrointestinal system: Abdomen is nondistended, soft and nontender. No organomegaly or masses felt. Normal bowel sounds heard. Central nervous system: Awake alert and oriented. No focal neurological deficits. Extremities: No edema  skin: No rashes, lesions or ulcers Psychiatry:  Judgement and insight appear normal. Mood & affect appropriate.     Data Reviewed: I have personally reviewed following labs and imaging studies  CBC: Recent Labs  Lab 03/14/22 1448 03/15/22 0118 03/16/22 0358  WBC 3.8* 3.2* 4.1  NEUTROABS 2.5  --   --   HGB 9.6* 6.8* 8.4*  HCT 28.9* 20.7* 25.3*  MCV 100.7* 102.5* 98.4  PLT 213 177 145*    Basic Metabolic Panel: Recent Labs  Lab 03/14/22 1448 03/15/22 0118 03/16/22 0358 03/16/22 0359  NA 136 138 138  --   K 3.5 3.5 3.3*  --   CL 101 106 107  --   CO2 20* 23 24  --   GLUCOSE 105* 105* 103*  --   BUN _0 --   CREATININE 1.11 0.91 0.70  --   CALCIUM 9.8 8.4* 8.9  --   MG  --   --   --  1.8    GFR: Estimated Creatinine Clearance: 88.3 mL/min (by C-G formula based on SCr of 0.7 mg/dL). Liver Function Tests: Recent Labs  Lab 03/14/22 1448  AST 50*  ALT 52*  ALKPHOS 106  BILITOT 1.0  PROT 8.1  ALBUMIN 3.4*     No results for input(s): "LIPASE", "AMYLASE" in the last 168 hours. Recent Labs  Lab 03/15/22 0118  AMMONIA 8*    Coagulation Profile: Recent Labs  Lab 03/14/22 1448  INR 1.8*    Cardiac Enzymes: No results for input(s): "CKTOTAL", "CKMB", "CKMBINDEX", "TROPONINI" in the last 168 hours. BNP (last 3 results) No results for input(s): "PROBNP" in the last 8760 hours. HbA1C: No results for input(s): "HGBA1C" in the last 72 hours. CBG: Recent Labs  Lab 03/15/22 2029  GLUCAP 132*   Lipid Profile: No results for input(s): "CHOL", "HDL", "LDLCALC", "TRIG", "CHOLHDL", "LDLDIRECT" in the last 72 hours. Thyroid Function Tests: No results for input(s): "TSH", "T4TOTAL", "FREET4", "T3FREE", "THYROIDAB" in the last 72 hours. Anemia Panel: Recent Labs    03/15/22 0118  VITAMINB12 374  FOLATE 35.7    Sepsis Labs: Recent Labs  Lab 03/14/22 1448 03/14/22 1730 03/15/22 0118 03/15/22 0440  LATICACIDVEN 3.5* 3.3* 0.8 0.9     Recent Results (from the past 240 hour(s))  Culture, blood (Routine x 2)     Status: None (Preliminary result)   Collection Time: 03/14/22  2:49 PM   Specimen: Right Antecubital; Blood  Result Value Ref Range Status   Specimen Description   Final    RIGHT ANTECUBITAL BLOOD Performed at [facility], [address] [place], [place] [address]    Special Requests   Final    Blood Culture adequate volume BOTTLES DRAWN AEROBIC AND ANAEROBIC Performed at [facility], [address] [place], [place] [address]    Culture   Final    NO GROWTH < 12 HOURS Performed at [facility], [address], [place], [place] [address]    Report Status PENDING  Incomplete  Culture, blood (Routine x 2)     Status: None (Preliminary result)   Collection Time: 03/14/22  3:10 PM   Specimen: Left Antecubital; Blood  Result Value Ref Range Status   Specimen Description   Final    LEFT ANTECUBITAL BLOOD Performed at [facility], [address] [place], [place] [address]    Special Requests   Final    Blood Culture adequate volume BOTTLES DRAWN AEROBIC AND ANAEROBIC Performed at  [facility], [address] [place], [place] [address]    Culture   Final    NO GROWTH < 12 HOURS Performed at [facility] [address], [place], [place] [address]    Report Status PENDING  Incomplete  SARS Coronavirus 2 by RT PCR (hospital order, performed in [facility] hospital lab) *cepheid single result test* Anterior Nasal Swab     Status: None   Collection Time: 03/14/22  6:22 PM   Specimen: Anterior Nasal Swab  Result Value Ref Range Status   SARS Coronavirus 2 by RT PCR NEGATIVE NEGATIVE Final    Comment: (NOTE) SARS-CoV-2 target nucleic acids are NOT DETECTED.  The SARS-CoV-2 RNA is generally detectable in upper and lower respiratory specimens during the acute phase of infection. The lowest concentration of SARS-CoV-2 viral copies this assay can detect is 250 copies / mL. A negative result does not preclude SARS-CoV-2 infection and should not be used as the sole basis for treatment or other patient management decisions.  A negative result may occur with improper specimen collection / handling, submission of specimen other than nasopharyngeal swab, presence of viral mutation(s) within the areas targeted by this assay, and inadequate number of viral copies (<250 copies / mL). A negative result must be combined with clinical observations, patient history, and epidemiological information.  Fact Sheet for Patients:   https://www.patel.info/  Fact Sheet for Healthcare Providers: https://hall.com/  This test is not yet approved or  cleared by the [place] FDA and has been authorized for detection and/or diagnosis of SARS-CoV-2 by FDA under an Emergency Use Authorization (EUA).  This EUA will remain in effect (meaning this test can be used) for  the duration of the COVID-19 declaration under Section 564(b)(1) of the Act, 21 U.S.C. section 360bbb-3(b)(1), unless the authorization is terminated or revoked sooner.  Performed at [facility], [address] [place], [place] [address]          Radiology Studies: MR BRAIN W WO CONTRAST  Result Date: 03/16/2022 CLINICAL DATA:  Mental status change, unknown cause Hx of neuroendocrine carcinoma with mets to brain EXAM: MRI HEAD WITHOUT AND WITH CONTRAST TECHNIQUE: Multiplanar, multiecho pulse sequences of the brain and surrounding structures were obtained without and with intravenous contrast. CONTRAST:  20m GADAVIST GADOBUTROL 1 MMOL/ML IV SOLN COMPARISON:  May 2023 FINDINGS: Brain: Substantially decreased metastatic burden. Residual smaller left cerebellar lesion measuring 5 mm (series 16, image 32), smaller right frontal lesion measuring 5 mm (image 40), and stable 2 mm left thalamic lesion (image 85). Decreased edema associated with the cerebellar and right frontal lesions. No new mass or abnormal enhancement. No acute infarction or hemorrhage. Few scattered foci of susceptibility reflecting chronic blood products including association with above lesions. Additional patchy foci of T2 hyperintensity in the supratentorial and pontine white matter may reflect chronic microvascular ischemic changes. Prominence of the ventricles and sulci reflects stable parenchymal volume loss. No extra-axial collection. Vascular: Major vessel flow voids at the skull base are preserved. Skull and upper cervical spine: Normal marrow signal is preserved. Sinuses/Orbits: Paranasal sinuses are aerated. Orbits are unremarkable. Other: Sella is unremarkable.  Mastoid air cells are clear. IMPRESSION: Substantially decreased metastatic burden with 3 residual stable or smaller lesions. No acute abnormality. Electronically Signed   By: PMacy MisM.D.   On: 03/16/2022 12:42   CT CHEST ABDOMEN PELVIS W  CONTRAST  Result Date: 03/14/2022 CLINICAL DATA:  66year old male with acute  chest, abdominal and pelvic pain. History of non-small cell lung cancer. EXAM: CT CHEST, ABDOMEN, AND PELVIS WITH CONTRAST TECHNIQUE: Multidetector CT imaging of the chest, abdomen and pelvis was performed following the standard protocol during bolus administration of intravenous contrast. RADIATION DOSE REDUCTION: This exam was performed according to the departmental dose-optimization program which includes automated exposure control, adjustment of the mA and/or kV according to patient size and/or use of iterative reconstruction technique. CONTRAST:  183m OMNIPAQUE IOHEXOL 300 MG/ML  SOLN COMPARISON:  12/17/2021 chest CT, 01/14/2022 abdomen and pelvis CT and other studies. FINDINGS: CT CHEST FINDINGS Cardiovascular: Heart size is normal. Heavy coronary artery atherosclerotic calcifications and evidence of CABG noted. Aortic atherosclerotic calcifications noted without evidence of thoracic aortic aneurysm. No pericardial effusion. Mediastinum/Nodes: Enlarging pretracheal, RIGHT paratracheal, RIGHT hilar and subcarinal lymph nodes are identified with 1.3 cm RIGHT paratracheal node (series 3: Image 12) previously measuring 1 cm, and 2 cm pretracheal node (3:21) previously measuring 1.2 cm. Visualized thyroid, trachea and esophagus are unremarkable. Lungs/Pleura: Innumerable pulmonary nodules within both lungs have increased in size and number compatible with metastatic disease. Index LEFT UPPER lobe nodule now measures 1.3 x 1.6 cm (4:55) previously 0.8 x 1.2 cm. Increasing streaky and confluent opacities within the RIGHT LOWER lobe noted. A small RIGHT basilar loculated effusion is again noted. There is no evidence of pneumothorax. Musculoskeletal: Irregularity of the posterior RIGHT 11th and 12th ribs are again noted. New irregularity/fracture of the posterior RIGHT 9th rib is present. CT ABDOMEN PELVIS FINDINGS Hepatobiliary: A few  tiny low-density hypodense lesions within the liver are unchanged. No definite gallbladder abnormality identified. There is no evidence of intrahepatic or extrahepatic biliary dilatation. Pancreas: Unremarkable Spleen: Unremarkable Adrenals/Urinary Tract: The kidneys are unremarkable except for a punctate nonobstructing RIGHT UPPER pole renal calculus and a stable LEFT renal cyst which needs no follow-up. The adrenal glands are unremarkable. No significant bladder abnormality identified. Stomach/Bowel: Fluid within the colon is noted and may represent a diarrheal state. No definite bowel wall thickening is present. There is no evidence of bowel obstruction or pneumoperitoneum. Vascular/Lymphatic: A 1.5 cm aortocaval node (3:76) previously measured 1.1 cm in short axis. Other shotty retroperitoneal lymph nodes are identified. Aortic atherosclerotic calcifications noted without evidence of aneurysm. Reproductive: Prostate is unremarkable. Other: No ascites, focal collection or pneumoperitoneum. Musculoskeletal: No acute or suspicious bony abnormalities are noted. IMPRESSION: 1. Increasing metastatic disease with increasing size and number of pulmonary nodules, and enlarging mediastinal, RIGHT hilar and aorto caval lymph nodes. 2. Increasing streaky and confluent opacities within the RIGHT LOWER lobe which may represent atelectasis, tumor or infection. Unchanged small loculated RIGHT basilar pleural effusion. 3. Unchanged irregularity of the posterior RIGHT 11th and 12th ribs with new irregularity/fracture of the posterior RIGHT 9th rib. Question metastatic disease or postprocedural/posttraumatic changes. 4. Fluid within the colon which may represent a diarrheal state. 5. Aortic Atherosclerosis (ICD10-I70.0). Electronically Signed   By: JMargarette CanadaM.D.   On: 03/14/2022 16:34   CT Head Wo Contrast  Result Date: 03/14/2022 CLINICAL DATA:  Altered mental status. Confusion and delusions since noon. Known brain  metastases associated with large-cell neuroendocrine carcinoma of the right upper lobe. EXAM: CT HEAD WITHOUT CONTRAST TECHNIQUE: Contiguous axial images were obtained from the base of the skull through the vertex without intravenous contrast. RADIATION DOSE REDUCTION: This exam was performed according to the departmental dose-optimization program which includes automated exposure control, adjustment of the mA and/or kV according to patient size and/or use of iterative reconstruction  technique. COMPARISON:  MR head without and with contrast 01/26/2022 FINDINGS: Brain: Focal hypoattenuation in the left cerebellum and high anterior right frontal lobe corresponds with edema about known lesions. Mild scattered white matter hypoattenuation scratched at mild diffuse white matter hypoattenuation is otherwise stable. No acute infarct or hemorrhage is present. No new areas of edema present to suggest significant progression of disease. The ventricles are of normal size. No significant extraaxial fluid collection is present. Vascular: Atherosclerotic calcifications are present within the cavernous internal carotid arteries. No hyperdense vessel is present. Skull: Calvarium is intact. No focal lytic or blastic lesions are present. No significant extracranial soft tissue lesion is present. Sinuses/Orbits: The paranasal sinuses and mastoid air cells are clear. The globes and orbits are within normal limits. IMPRESSION: 1. No acute intracranial abnormality. 2. Stable appearance of edema about known lesions in the left cerebellum and high anterior right frontal lobe. 3. No new areas of edema to suggest significant progression of disease. 4. Atherosclerosis. Electronically Signed   By: San Morelle M.D.   On: 03/14/2022 16:33   DG Chest Portable 1 View  Result Date: 03/14/2022 CLINICAL DATA:  Altered mental status. Fever. Stage IV lung cancer. EXAM: PORTABLE CHEST 1 VIEW COMPARISON:  01/14/2022 and prior studies  FINDINGS: Unchanged cardiomediastinal silhouette with CABG changes again noted. RIGHT IJ central venous catheter with tip overlying the cavoatrial junction again noted. Scattered nodules within both lungs are again identified compatible with metastatic disease, better visualized with CT. No definite airspace disease identified. A small RIGHT pleural effusion with mild RIGHT basilar atelectasis noted. There is no evidence of pneumothorax. IMPRESSION: 1. Small RIGHT pleural effusion with mild RIGHT basilar atelectasis. 2. Bilateral pulmonary nodules/metastatic disease again noted. Electronically Signed   By: Margarette Canada M.D.   On: 03/14/2022 15:32        Scheduled Meds:  Chlorhexidine Gluconate Cloth  6 each Topical Daily   dexamethasone (DECADRON) injection  4 mg Intravenous Daily   DULoxetine  30 mg Oral BID   morphine  60 mg Oral Q12H   sodium chloride flush  10-40 mL Intracatheter Q12H   Continuous Infusions:  lactated ringers 75 mL/hr at 03/16/22 0032     LOS: 2 days    Time spent: 38 minutes  [name], MD 03/16/2022, 1:00 PM

## 2022-03-16 NOTE — Progress Notes (Signed)
   Palliative Medicine Inpatient Follow Up Note     Chart Reviewed. Patient assessed at the bedside.   Patient sitting up in bed watching TV. Wife at bedside. No acute distress. Pain is well managed. States he was initially going to discharge home today. Hopeful he will go tomorrow.   We will plan to continue close follow-up at the cancer center and adjust pain regimen accordingly.   Discussed the importance of continued conversation with family and their  medical providers regarding overall plan of care and treatment options, ensuring decisions are within the context of the patients values and GOCs.   Questions addressed and support provided.    Objective Assessment: Vital Signs Vitals:   03/16/22 0505 03/16/22 1230  BP: 127/72 113/70  Pulse: 70 71  Resp: 20 20  Temp: 98.2 F (36.8 C) 98.2 F (36.8 C)  SpO2: 100% 100%    Intake/Output Summary (Last 24 hours) at 03/16/2022 1645 Last data filed at 03/16/2022 1603 Gross per 24 hour  Intake 2090.68 ml  Output 400 ml  Net 1690.68 ml   Last Weight  Most recent update: 03/15/2022  5:34 AM    Weight  68.7 kg (151 lb 6.4 oz)            Gen:  NAD CV: Regular rate and rhythm, no murmurs rubs or gallops PULM: normal breathing pattern  Neuro: Alert and oriented x3  SUMMARY OF RECOMMENDATIONS   Continue with current plan of care per medical team  Will plan to follow-up at [facility] after discharge for ongoing symptom management.  PMT will continue to support and follow on as needed basis. Please secure chat for urgent needs.   Symptom Management:  Neoplasm related pain Dilaudid 8 mg every 6 hours as needed for breakthrough pain-3 doses received over past 24 hours MS Contin 60 mg twice daily Cymbalta 30 mg twice daily Bowel regimen Patient has experienced diarrhea over the past 24 hours.  Would be cautious towards bowel regimen at this time.  Once bowels returned to normal would consider MiraLAX daily. Nausea Zofran 8 mg  every 8 hours as needed Headaches Tylenol 650 every 6 hours as needed  Time Total: 20 min   Visit consisted of counseling and education dealing with the complex and emotionally intense issues of symptom management and palliative care in the setting of serious and potentially life-threatening illness.Greater than 50%  of this time was spent counseling and coordinating care related to the above assessment and plan.  Alda Lea, AGPCNP-BC  Palliative Medicine Team [phone]  Palliative Medicine Team providers are available by phone from 7am to 7pm daily and can be reached through the team cell phone. Should this patient require assistance outside of these hours, please call the patient's attending physician.

## 2022-03-17 ENCOUNTER — Inpatient Hospital Stay: Payer: Medicare Other

## 2022-03-17 ENCOUNTER — Telehealth: Payer: Self-pay | Admitting: *Deleted

## 2022-03-17 ENCOUNTER — Inpatient Hospital Stay: Payer: Medicare Other | Admitting: Nurse Practitioner

## 2022-03-17 ENCOUNTER — Inpatient Hospital Stay: Payer: Medicare Other | Admitting: Dietician

## 2022-03-17 ENCOUNTER — Inpatient Hospital Stay: Payer: Medicare Other | Admitting: Physician Assistant

## 2022-03-17 DIAGNOSIS — G9341 Metabolic encephalopathy: Secondary | ICD-10-CM | POA: Diagnosis not present

## 2022-03-17 LAB — COMPREHENSIVE METABOLIC PANEL
ALT: 35 U/L (ref 0–44)
AST: 33 U/L (ref 15–41)
Albumin: 2.6 g/dL — ABNORMAL LOW (ref 3.5–5.0)
Alkaline Phosphatase: 72 U/L (ref 38–126)
Anion gap: 5 (ref 5–15)
BUN: 11 mg/dL (ref 8–23)
CO2: 25 mmol/L (ref 22–32)
Calcium: 8.5 mg/dL — ABNORMAL LOW (ref 8.9–10.3)
Chloride: 110 mmol/L (ref 98–111)
Creatinine, Ser: 0.6 mg/dL — ABNORMAL LOW (ref 0.61–1.24)
GFR, Estimated: >60 mL/min (ref >60)
Glucose, Bld: 89 mg/dL (ref 70–99)
Potassium: 3.3 mmol/L — ABNORMAL LOW (ref 3.5–5.1)
Sodium: 140 mmol/L (ref 135–145)
Total Bilirubin: 0.3 mg/dL (ref 0.3–1.2)
Total Protein: 5.8 g/dL — ABNORMAL LOW (ref 6.5–8.1)

## 2022-03-17 LAB — CBC
HCT: 26.3 % — ABNORMAL LOW (ref 39.0–52.0)
Hemoglobin: 8.5 g/dL — ABNORMAL LOW (ref 13.0–17.0)
MCH: 32.6 pg (ref 26.0–34.0)
MCHC: 32.3 g/dL (ref 30.0–36.0)
MCV: 100.8 fL — ABNORMAL HIGH (ref 80.0–100.0)
Platelets: 158 10*3/uL (ref 150–400)
RBC: 2.61 MIL/uL — ABNORMAL LOW (ref 4.22–5.81)
RDW: 19.5 % — ABNORMAL HIGH (ref 11.5–15.5)
WBC: 4.8 10*3/uL (ref 4.0–10.5)
nRBC: 0 % (ref 0.0–0.2)

## 2022-03-17 MED ORDER — POTASSIUM CHLORIDE CRYS ER 20 MEQ PO TBCR
40.0000 meq | EXTENDED_RELEASE_TABLET | Freq: Once | ORAL | Status: AC
  Administered 2022-03-17: 40 meq via ORAL
  Filled 2022-03-17: qty 2

## 2022-03-17 MED ORDER — HEPARIN SOD (PORK) LOCK FLUSH 100 UNIT/ML IV SOLN
500.0000 [IU] | INTRAVENOUS | Status: AC | PRN
  Administered 2022-03-17: 500 [IU]

## 2022-03-17 NOTE — Plan of Care (Signed)

## 2022-03-17 NOTE — Discharge Summary (Signed)
PatientPhysician Discharge Summary  [name] SLH:[id number] DOB: [birth year] DOA: 03/14/2022  PCP: [name], MD  Admit date: 03/14/2022 Discharge date: 03/17/2022 30 Day Unplanned Readmission Risk Score    Flowsheet Row ED to Hosp-Admission (Current) from 03/14/2022 in [facility]  30 Day Unplanned Readmission Risk Score (%) 54.66 Filed at 03/17/2022 1200       This score is the patient's risk of an unplanned readmission within 30 days of being discharged (0 -100%). The score is based on dignosis, age, lab data, medications, orders, and past utilization.   Low:  0-14.9   Medium: 15-21.9   High: 22-29.9   Extreme: 30 and above          Admitted From: Home Disposition: Home  Recommendations for Outpatient Follow-up:  Follow up with PCP in 1-2 weeks Please obtain BMP/CBC in one week Please follow up with your PCP on the following pending results: Unresulted Labs (From admission, onward)     Start     Ordered   03/16/22 1301  C Difficile Quick Screen w PCR reflex  (C Difficile quick screen w PCR reflex panel )  Once, for 24 hours,   TIMED       References:    CDiff Information Tool   03/16/22 1300   Unscheduled  Occult blood card to lab, stool  As needed,   R      03/15/22 0209              Home Health: None but PT recommended outpatient PT referral Equipment/Devices: None  Discharge Condition: Stable CODE STATUS: DNR Diet recommendation: Cardiac  Subjective: Seen and examined.  He has no complaints.  He is fully alert and oriented.  Brief/Interim Summary: [name] is a 66 y.o. 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   Per history, about 3 days prior to presentation, he ran out of Dilaudid briefly and daughter thinks that after he had them filled he made a conscious decision to quit taking them on his own.  He had symptoms of shivering, nausea and diarrhea 2 days prior.  Decrease fluid intake.  Then on the day of admission, around noon he had an acute change in his mental status and became completely disoriented.  He normally is alert and oriented x4 at baseline.  Daughter decided to count his Dilaudid pills and noticed he had only taken 2 pills in the past 72 hours although it is prescribed for 3 times daily PRN. She immediately gave him one Dilaudid pill prior to coming to ED.  She reports that he has been dealing with on and off opioid use for decades due to issues with pain. He also has MS contin 45m q12hr and ativan [id number] on his list. She is unsure how or if he has been taking these.    In the ED, he was afebrile tachycardic tachypneic and normotensive on room air. Has leukopenia and elevated lactate of 3.3. Creatinine was elevated 1.11 from prior of 0.97. UA was negative. UDS positive for opioids.   CT head was negative with stable appearing edema known brain lesion. CT abdomen pelvis showed worsening metastatic disease in the lungs and increased streaky and confluent  opacity in the right lower lobe that could be atelectasis, tumor versus infection.    No obvious source of infection was found but given SIRS criteria.  He was started on Zosyn, vancomycin and cefepime by ED PA and eventually admitted to hospitalist service.  Details of hospitalization as below.   #1 acute metabolic encephalopathy in the setting of metastatic brain lesions and  Patient was dehydrated with AKI on admission.  He had diarrhea at home.  His pain is managed by palliative care as an outpatient and appreciate their input. On Dilaudid 8 mg every 6 hours as needed MS Contin 60 mg twice a day Cymbalta 30 mg twice a  day Zofran 8 mg every 8 as needed Tylenol as needed CT scan of brain 03/16/2022-substantially decreased metastatic burden with 3 residual stable or smaller lesions.  No acute abnormality. CT of the chest abdomen and pelvis 03/15/2022-increasing metastatic disease with increasing size and number of pulmonary nodules and enlarging mediastinal, right hilar and aortocaval lymph nodes.  Increasing streaky and confluent opacities within the right lower lobe.  New irregularity in the 11th and 12th ribs possible metastatic disease.   He received Decadron 8 mg on admission.  This probably improved his mental status.  He was continued on Decadron here. He was started on cefepime and Flagyl on admission for he met SIRS criteria.  However no localized signs of infection noted so far will DC antibiotics and monitor.  Nonetheless, it appears that the source of his encephalopathy remains unclear.  However patient has improved and is fully alert and oriented since last 2 days.   #2 anemia hemoglobin down to 6.8 on admission.  He was given 1 units of blood transfusion. No signs of active bleed.  Xarelto was held.  Will resume Xarelto at discharge.   #3 leukopenia-likely secondary to chemo resolved.   #4 paroxysmal atrial fibrillation rate controlled.  Resume Xarelto.   #5 AKI on CKD stage IIIa: Improved.   #6 malignant poorly differentiated neuroendocrine carcinoma-he had right upper lobectomy and systemic chemotherapy and palliative radiation.  He had whole brain radiation for brain mets.  And is currently undergoing systemic chemotherapy.  He sees Dr. [name]. CT of the chest abdomen and pelvis with increased metastatic disease with increase in size and number of the pulmonary nodules enlarging right hilar lymph nodes.  Increasing opacity to the right lower lobe could be atelectasis tumor or infection.  CT head with stable edema around known lesions. Oncology consulted.  They are arranging follow-up at [place] with  Dr. Durenda Hurt and enrolling him in a clinical trial.   #7 diarrhea: Patient had diarrhea at home but he did not have any diarrhea since admission until yesterday morning when he had only 1 episode of loose bowel movement.  GI pathogen panel is negative.  C. difficile was ordered but patient has not had any further bowel movements which in itself reduces the chance of C. difficile to almost none.  Discharge plan was discussed with patient and/or family member/daughter and they verbalized understanding and agreed with it.  Discharge Diagnoses:  Principal Problem:   Acute metabolic encephalopathy Active Problems:   S/P CABG x 5   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HCC)   Acute renal failure superimposed on stage 3a chronic kidney disease (HCC)   AF (paroxysmal atrial fibrillation) (HCC)   Anemia   Leukopenia    Discharge Instructions   Allergies as of 03/17/2022       Reactions   Prednisone  Other (See Comments)   States that this med makes him "crazy"   Tetanus Toxoids Swelling, Other (See Comments)   Fever, Swelling of the arm    Wellbutrin [bupropion] Other (See Comments)   Crazy thoughts, nightmares   Varenicline Other (See Comments)   Unpleasant dreams        Medication List     STOP taking these medications    prochlorperazine 10 MG tablet Commonly known as: COMPAZINE   senna-docusate 8.6-50 MG tablet Commonly known as: Senokot-S       TAKE these medications    acetaminophen 500 MG tablet Commonly known as: TYLENOL Take 1,000 mg by mouth every 6 (six) hours as needed for mild pain, fever or headache.   benzonatate 100 MG capsule Commonly known as: TESSALON TAKE 1 CAPSULE(100 MG) BY MOUTH THREE TIMES DAILY AS NEEDED FOR COUGH What changed: See the new instructions.   bisacodyl 5 MG EC tablet Commonly known as: DULCOLAX Take 5 mg by mouth daily as needed (constipation).   dexamethasone 4 MG tablet Commonly known as:  DECADRON Take 1 tablet (4 mg total) by mouth daily.   DULoxetine 30 MG capsule Commonly known as: CYMBALTA Take 1 capsule (30 mg total) by mouth 2 (two) times daily.   folic acid 1 MG tablet Commonly known as: FOLVITE Take 1 tablet (1 mg total) by mouth daily.   HYDROmorphone 8 MG tablet Commonly known as: DILAUDID Take 1 tablet by mouth every 6 hours as needed for severe pain.   lidocaine-prilocaine cream Commonly known as: EMLA Apply to the Port-A-Cath site 30-60-minute before treatment What changed:  how much to take how to take this when to take this reasons to take this additional instructions   LORazepam 1 MG tablet Commonly known as: ATIVAN Take 1 tablet (1 mg total) by mouth every 8 (eight) hours as needed for anxiety.   magnesium oxide 400 (240 Mg) MG tablet Commonly known as: MAG-OX Take 400 mg by mouth daily.   metoprolol succinate 25 MG 24 hr tablet Commonly known as: TOPROL-XL Take 0.5 tablets (12.5 mg total) by mouth daily.   morphine 60 MG 12 hr tablet Commonly known as: MS CONTIN Take 1 tablet by mouth every 12  hours. Start taking on: March 19, 2022   ondansetron 8 MG tablet Commonly known as: ZOFRAN Take 1 tablet by mouth every 8 hours as needed for nausea or vomiting. Starting 3 days after chemotherapy   polyethylene glycol 17 g packet Commonly known as: MIRALAX / GLYCOLAX Take 17 g by mouth daily. What changed:  when to take this reasons to take this   Stool Softener 100 MG capsule Generic drug: docusate sodium Take 100 mg by mouth 2 (two) times daily as needed (constipation).   tiZANidine 2 MG tablet Commonly known as: ZANAFLEX Take 1 tablet (2 mg total) by mouth at bedtime as needed for muscle spasms.   Xarelto 20 MG Tabs tablet Generic drug: rivaroxaban Take 20 mg by mouth every evening.        Follow-up Information     [name], MD Follow up in 1 week(s).   Specialty: Family Medicine Contact information: [address] [place] [address] [phone]         [name], MD .   Specialties: Cardiology, Radiology, Interventional Cardiology Contact information: [address] [place] [address] [phone]  Allergies  Allergen Reactions   Prednisone Other (See Comments)    States that this med makes him "crazy"   Tetanus Toxoids Swelling and Other (See Comments)    Fever, Swelling of the arm    Wellbutrin [Bupropion] Other (See Comments)    Crazy thoughts, nightmares   Varenicline Other (See Comments)    Unpleasant dreams    Consultations: None   Procedures/Studies: MR BRAIN W WO CONTRAST  Result Date: 03/16/2022 CLINICAL DATA:  Mental status change, unknown cause Hx of neuroendocrine carcinoma with mets to brain EXAM: MRI HEAD WITHOUT AND WITH CONTRAST TECHNIQUE: Multiplanar, multiecho pulse sequences of the brain and surrounding structures were obtained without and with intravenous contrast. CONTRAST:  29m GADAVIST GADOBUTROL 1 MMOL/ML IV SOLN COMPARISON:  May 2023 FINDINGS: Brain: Substantially decreased metastatic burden. Residual smaller left cerebellar lesion measuring 5 mm (series 16, image 32), smaller right frontal lesion measuring 5 mm (image 40), and stable 2 mm left thalamic lesion (image 85). Decreased edema associated with the cerebellar and right frontal lesions. No new mass or abnormal enhancement. No acute infarction or hemorrhage. Few scattered foci of susceptibility reflecting chronic blood products including association with above lesions. Additional patchy foci of T2 hyperintensity in the supratentorial and pontine white matter may reflect chronic microvascular ischemic changes. Prominence of the ventricles and sulci reflects stable parenchymal volume loss. No extra-axial collection. Vascular: Major vessel flow voids at the skull base are preserved. Skull and upper cervical spine: Normal marrow signal is preserved.  Sinuses/Orbits: Paranasal sinuses are aerated. Orbits are unremarkable. Other: Sella is unremarkable.  Mastoid air cells are clear. IMPRESSION: Substantially decreased metastatic burden with 3 residual stable or smaller lesions. No acute abnormality. Electronically Signed   By: PMacy MisM.D.   On: 03/16/2022 12:42   CT CHEST ABDOMEN PELVIS W CONTRAST  Result Date: 03/14/2022 CLINICAL DATA:  66year old male with acute chest, abdominal and pelvic pain. History of non-small cell lung cancer. EXAM: CT CHEST, ABDOMEN, AND PELVIS WITH CONTRAST TECHNIQUE: Multidetector CT imaging of the chest, abdomen and pelvis was performed following the standard protocol during bolus administration of intravenous contrast. RADIATION DOSE REDUCTION: This exam was performed according to the departmental dose-optimization program which includes automated exposure control, adjustment of the mA and/or kV according to patient size and/or use of iterative reconstruction technique. CONTRAST:  1084mOMNIPAQUE IOHEXOL 300 MG/ML  SOLN COMPARISON:  12/17/2021 chest CT, 01/14/2022 abdomen and pelvis CT and other studies. FINDINGS: CT CHEST FINDINGS Cardiovascular: Heart size is normal. Heavy coronary artery atherosclerotic calcifications and evidence of CABG noted. Aortic atherosclerotic calcifications noted without evidence of thoracic aortic aneurysm. No pericardial effusion. Mediastinum/Nodes: Enlarging pretracheal, RIGHT paratracheal, RIGHT hilar and subcarinal lymph nodes are identified with 1.3 cm RIGHT paratracheal node (series 3: Image 12) previously measuring 1 cm, and 2 cm pretracheal node (3:21) previously measuring 1.2 cm. Visualized thyroid, trachea and esophagus are unremarkable. Lungs/Pleura: Innumerable pulmonary nodules within both lungs have increased in size and number compatible with metastatic disease. Index LEFT UPPER lobe nodule now measures 1.3 x 1.6 cm (4:55) previously 0.8 x 1.2 cm. Increasing streaky and  confluent opacities within the RIGHT LOWER lobe noted. A small RIGHT basilar loculated effusion is again noted. There is no evidence of pneumothorax. Musculoskeletal: Irregularity of the posterior RIGHT 11th and 12th ribs are again noted. New irregularity/fracture of the posterior RIGHT 9th rib is present. CT ABDOMEN PELVIS FINDINGS Hepatobiliary: A few tiny low-density hypodense lesions within the liver are unchanged. No definite  gallbladder abnormality identified. There is no evidence of intrahepatic or extrahepatic biliary dilatation. Pancreas: Unremarkable Spleen: Unremarkable Adrenals/Urinary Tract: The kidneys are unremarkable except for a punctate nonobstructing RIGHT UPPER pole renal calculus and a stable LEFT renal cyst which needs no follow-up. The adrenal glands are unremarkable. No significant bladder abnormality identified. Stomach/Bowel: Fluid within the colon is noted and may represent a diarrheal state. No definite bowel wall thickening is present. There is no evidence of bowel obstruction or pneumoperitoneum. Vascular/Lymphatic: A 1.5 cm aortocaval node (3:76) previously measured 1.1 cm in short axis. Other shotty retroperitoneal lymph nodes are identified. Aortic atherosclerotic calcifications noted without evidence of aneurysm. Reproductive: Prostate is unremarkable. Other: No ascites, focal collection or pneumoperitoneum. Musculoskeletal: No acute or suspicious bony abnormalities are noted. IMPRESSION: 1. Increasing metastatic disease with increasing size and number of pulmonary nodules, and enlarging mediastinal, RIGHT hilar and aorto caval lymph nodes. 2. Increasing streaky and confluent opacities within the RIGHT LOWER lobe which may represent atelectasis, tumor or infection. Unchanged small loculated RIGHT basilar pleural effusion. 3. Unchanged irregularity of the posterior RIGHT 11th and 12th ribs with new irregularity/fracture of the posterior RIGHT 9th rib. Question metastatic disease or  postprocedural/posttraumatic changes. 4. Fluid within the colon which may represent a diarrheal state. 5. Aortic Atherosclerosis (ICD10-I70.0). Electronically Signed   By: Margarette Canada M.D.   On: 03/14/2022 16:34   CT Head Wo Contrast  Result Date: 03/14/2022 CLINICAL DATA:  Altered mental status. Confusion and delusions since noon. 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 EXAM: CT HEAD WITHOUT CONTRAST TECHNIQUE: Contiguous axial images were obtained from the base of the skull through the vertex without intravenous contrast. RADIATION DOSE REDUCTION: This exam was performed according to the departmental dose-optimization program which includes automated exposure control, adjustment of the mA and/or kV according to patient size and/or use of iterative reconstruction technique. COMPARISON:  MR head without and with contrast 01/26/2022 FINDINGS: Brain: Focal hypoattenuation in the left cerebellum and high anterior right frontal lobe corresponds with edema about known lesions. Mild scattered white matter hypoattenuation scratched at mild diffuse white matter hypoattenuation is otherwise stable. No acute infarct or hemorrhage is present. No new areas of edema present to suggest significant progression of disease. The ventricles are of normal size. No significant extraaxial fluid collection is present. Vascular: Atherosclerotic calcifications are present within the cavernous internal carotid arteries. No hyperdense vessel is present. Skull: Calvarium is intact. No focal lytic or blastic lesions are present. No significant extracranial soft tissue lesion is present. Sinuses/Orbits: The paranasal sinuses and mastoid air cells are clear. The globes and orbits are within normal limits. IMPRESSION: 1. No acute intracranial abnormality. 2. Stable appearance of edema about known lesions in the left cerebellum and high anterior right frontal lobe. 3. No new areas of  edema to suggest significant progression of disease. 4. Atherosclerosis. Electronically Signed   By: San Morelle M.D.   On: 03/14/2022 16:33   DG Chest Portable 1 View  Result Date: 03/14/2022 CLINICAL DATA:  Altered mental status. Fever. Stage IV lung cancer. EXAM: PORTABLE CHEST 1 VIEW COMPARISON:  01/14/2022 and prior studies FINDINGS: Unchanged cardiomediastinal silhouette with CABG changes again noted. RIGHT IJ central venous catheter with tip overlying the cavoatrial junction again noted. Scattered nodules within both lungs are again identified compatible with metastatic disease, better visualized with CT. No definite airspace disease identified. A small RIGHT pleural effusion with mild RIGHT basilar atelectasis noted. There is no evidence  of pneumothorax. IMPRESSION: 1. Small RIGHT pleural effusion with mild RIGHT basilar atelectasis. 2. Bilateral pulmonary nodules/metastatic disease again noted. Electronically Signed   By: Margarette Canada M.D.   On: 03/14/2022 15:32     Discharge Exam: Vitals:   03/16/22 2035 03/17/22 0507  BP: 103/62 116/70  Pulse: 79 83  Resp: 20 20  Temp: 98.3 F (36.8 C) 98.4 F (36.9 C)  SpO2: 100% 100%   Vitals:   03/16/22 0505 03/16/22 1230 03/16/22 2035 03/17/22 0507  BP: 127/72 113/70 103/62 116/70  Pulse: 70 71 79 83  Resp: _0 Temp: 98.2 F (36.8 C) 98.2 F (36.8 C) 98.3 F (36.8 C) 98.4 F (36.9 C)  TempSrc: Oral Oral Oral   SpO2: 100% 100% 100% 100%  Weight:      Height:        General: Pt is alert, awake, not in acute distress Cardiovascular: RRR, S1/S2 +, no rubs, no gallops Respiratory: CTA bilaterally, no wheezing, no rhonchi Abdominal: Soft, NT, ND, bowel sounds + Extremities: no edema, no cyanosis    The results of significant diagnostics from this hospitalization (including imaging, microbiology, ancillary and laboratory) are listed below for reference.     Microbiology: Recent Results (from the past 240 hour(s))   Culture, blood (Routine x 2)     Status: None (Preliminary result)   Collection Time: 03/14/22  2:49 PM   Specimen: Right Antecubital; Blood  Result Value Ref Range Status   Specimen Description   Final    RIGHT ANTECUBITAL BLOOD Performed at [facility], [address] [place], [place] [address]    Special Requests   Final    Blood Culture adequate volume BOTTLES DRAWN AEROBIC AND ANAEROBIC Performed at [facility], [address] [place], [place] [address]    Culture   Final    NO GROWTH 3 DAYS Performed at [facility], [place] [address] [place], [place] [address]    Report Status PENDING  Incomplete  Culture, blood (Routine x 2)     Status: None (Preliminary result)   Collection Time: 03/14/22  3:10 PM   Specimen: Left Antecubital; Blood  Result Value Ref Range Status   Specimen Description   Final    LEFT ANTECUBITAL BLOOD Performed at [facility], [address] [place], [place] [address]    Special Requests   Final    Blood Culture adequate volume BOTTLES DRAWN AEROBIC AND ANAEROBIC Performed at [facility], [address] [place], [place] [address]    Culture   Final    NO GROWTH 3 DAYS Performed at [facility], [place] [address] [place], [place] [address]    Report Status PENDING  Incomplete  SARS Coronavirus 2 by RT PCR (hospital order, performed in [facility] hospital lab) *cepheid single result test* Anterior Nasal Swab     Status: None   Collection Time: 03/14/22  6:22 PM   Specimen: Anterior Nasal Swab  Result Value Ref Range Status   SARS Coronavirus 2 by RT PCR NEGATIVE NEGATIVE Final    Comment: (NOTE) SARS-CoV-2 target nucleic acids are NOT DETECTED.  The SARS-CoV-2 RNA is generally detectable in upper and lower respiratory specimens during the acute phase of infection. The lowest concentration of SARS-CoV-2 viral copies this assay can detect is 250 copies / mL. A negative result does not  preclude SARS-CoV-2 infection and should not be used  as the sole basis for treatment or other patient management decisions.  A negative result may occur with improper specimen collection / handling, submission of specimen other than nasopharyngeal swab, presence of viral mutation(s) within the areas targeted by this assay, and inadequate number of viral copies (<250 copies / mL). A negative result must be combined with clinical observations, patient history, and epidemiological information.  Fact Sheet for Patients:   https://www.patel.info/  Fact Sheet for Healthcare Providers: https://hall.com/  This test is not yet approved or  cleared by the [place] FDA and has been authorized for detection and/or diagnosis of SARS-CoV-2 by FDA under an Emergency Use Authorization (EUA).  This EUA will remain in effect (meaning this test can be used) for the duration of the COVID-19 declaration under Section 564(b)(1) of the Act, 21 U.S.C. section 360bbb-3(b)(1), unless the authorization is terminated or revoked sooner.  Performed at [facility], [address] [place], [place] [address]   Gastrointestinal Panel by PCR , Stool     Status: None   Collection Time: 03/15/22 10:02 PM   Specimen: Rectum; Stool  Result Value Ref Range Status   Campylobacter species NOT DETECTED NOT DETECTED Final   Plesimonas shigelloides NOT DETECTED NOT DETECTED Final   Salmonella species NOT DETECTED NOT DETECTED Final   Yersinia enterocolitica NOT DETECTED NOT DETECTED Final   Vibrio species NOT DETECTED NOT DETECTED Final   Vibrio cholerae NOT DETECTED NOT DETECTED Final   Enteroaggregative E coli (EAEC) NOT DETECTED NOT DETECTED Final   Enteropathogenic E coli (EPEC) NOT DETECTED NOT DETECTED Final   Enterotoxigenic E coli (ETEC) NOT DETECTED NOT DETECTED Final   Shiga like toxin producing E coli (STEC) NOT DETECTED NOT DETECTED Final    Shigella/Enteroinvasive E coli (EIEC) NOT DETECTED NOT DETECTED Final   Cryptosporidium NOT DETECTED NOT DETECTED Final   Cyclospora cayetanensis NOT DETECTED NOT DETECTED Final   Entamoeba histolytica NOT DETECTED NOT DETECTED Final   Giardia lamblia NOT DETECTED NOT DETECTED Final   Adenovirus F40/41 NOT DETECTED NOT DETECTED Final   Astrovirus NOT DETECTED NOT DETECTED Final   Norovirus GI/GII NOT DETECTED NOT DETECTED Final   Rotavirus A NOT DETECTED NOT DETECTED Final   Sapovirus (I, II, IV, and V) NOT DETECTED NOT DETECTED Final    Comment: Performed at [facility], [address] [place], [place] [address]     Labs: BNP (last 3 results) Recent Labs    04/28/21 1751 06/04/21 0835 06/06/21 1736  BNP 95.2 53.0 82.5   Basic Metabolic Panel: Recent Labs  Lab 03/14/22 1448 03/15/22 0118 03/16/22 0358 03/16/22 0359 03/17/22 0328  NA 136 138 138  --  140  K 3.5 3.5 3.3*  --  3.3*  CL 101 106 107  --  110  CO2 20* 23 24  --  25  GLUCOSE 105* 105* 103*  --  89  BUN _0 --  11  CREATININE 1.11 0.91 0.70  --  0.60*  CALCIUM 9.8 8.4* 8.9  --  8.5*  MG  --   --   --  1.8  --    Liver Function Tests: Recent Labs  Lab 03/14/22 1448 03/17/22 0328  AST 50* 33  ALT 52* 35  ALKPHOS 106 72  BILITOT 1.0 0.3  PROT 8.1 5.8*  ALBUMIN 3.4* 2.6*   No results for input(s): "LIPASE", "AMYLASE" in the last 168 hours. Recent Labs  Lab 03/15/22 0118  AMMONIA 8*  CBC: Recent Labs  Lab 03/14/22 1448 03/15/22 0118 03/16/22 0358 03/17/22 0328  WBC 3.8* 3.2* 4.1 4.8  NEUTROABS 2.5  --   --   --   HGB 9.6* 6.8* 8.4* 8.5*  HCT 28.9* 20.7* 25.3* 26.3*  MCV 100.7* 102.5* 98.4 100.8*  PLT 213 177 145* 158   Cardiac Enzymes: No results for input(s): "CKTOTAL", "CKMB", "CKMBINDEX", "TROPONINI" in the last 168 hours. BNP: Invalid input(s): "POCBNP" CBG: Recent Labs  Lab 03/15/22 2029  GLUCAP 132*   D-Dimer No results for input(s): "DDIMER" in the last  72 hours. Hgb A1c No results for input(s): "HGBA1C" in the last 72 hours. Lipid Profile No results for input(s): "CHOL", "HDL", "LDLCALC", "TRIG", "CHOLHDL", "LDLDIRECT" in the last 72 hours. Thyroid function studies No results for input(s): "TSH", "T4TOTAL", "T3FREE", "THYROIDAB" in the last 72 hours.  Invalid input(s): "FREET3" Anemia work up Recent Labs    03/15/22 0118  VITAMINB12 374  FOLATE 35.7   Urinalysis    Component Value Date/Time   COLORURINE YELLOW 03/14/2022 [place] 03/14/2022 1720   LABSPEC 1.015 03/14/2022 1720   PHURINE 7.5 03/14/2022 [place] 03/14/2022 [place] 03/14/2022 [place] NEGATIVE 03/14/2022 1720   KETONESUR 80 (A) 03/14/2022 1720   PROTEINUR NEGATIVE 03/14/2022 1720   UROBILINOGEN 0.2 05/13/2014 0658   NITRITE NEGATIVE 03/14/2022 1720   LEUKOCYTESUR NEGATIVE 03/14/2022 1720   Sepsis Labs Recent Labs  Lab 03/14/22 1448 03/15/22 0118 03/16/22 0358 03/17/22 0328  WBC 3.8* 3.2* 4.1 4.8   Microbiology Recent Results (from the past 240 hour(s))  Culture, blood (Routine x 2)     Status: None (Preliminary result)   Collection Time: 03/14/22  2:49 PM   Specimen: Right Antecubital; Blood  Result Value Ref Range Status   Specimen Description   Final    RIGHT ANTECUBITAL BLOOD Performed at [facility], [address] [place], [place] [address]    Special Requests   Final    Blood Culture adequate volume BOTTLES DRAWN AEROBIC AND ANAEROBIC Performed at [facility], [address] [place], [place] [address]    Culture   Final    NO GROWTH 3 DAYS Performed at [facility], [place] [address] [place], [place] [address]    Report Status PENDING  Incomplete  Culture, blood (Routine x 2)     Status: None (Preliminary result)   Collection Time: 03/14/22  3:10 PM   Specimen: Left Antecubital; Blood  Result Value Ref Range Status   Specimen Description   Final     LEFT ANTECUBITAL BLOOD Performed at [facility], [address] [place], [place] [address]    Special Requests   Final    Blood Culture adequate volume BOTTLES DRAWN AEROBIC AND ANAEROBIC Performed at [facility], [address], [place], [place] [address]    Culture   Final    NO GROWTH 3 DAYS Performed at [facility], [place] [address], [place],  [address]    Report Status PENDING  Incomplete  SARS Coronavirus 2 by RT PCR (hospital order, performed in [facility] hospital lab) *cepheid single result test* Anterior Nasal Swab     Status: None   Collection Time: 03/14/22  6:22 PM   Specimen: Anterior Nasal Swab  Result Value Ref Range Status   SARS Coronavirus 2 by RT PCR NEGATIVE NEGATIVE Final  Comment: (NOTE) SARS-CoV-2 target nucleic acids are NOT DETECTED.  The SARS-CoV-2 RNA is generally detectable in upper and lower respiratory specimens during the acute phase of infection. The lowest concentration of SARS-CoV-2 viral copies this assay can detect is 250 copies / mL. A negative result does not preclude SARS-CoV-2 infection and should not be used as the sole basis for treatment or other patient management decisions.  A negative result may occur with improper specimen collection / handling, submission of specimen other than nasopharyngeal swab, presence of viral mutation(s) within the areas targeted by this assay, and inadequate number of viral copies (<250 copies / mL). A negative result must be combined with clinical observations, patient history, and epidemiological information.  Fact Sheet for Patients:   https://www.patel.info/  Fact Sheet for Healthcare Providers: https://hall.com/  This test is not yet approved or  cleared by the [place] FDA and has been authorized for detection and/or diagnosis of SARS-CoV-2 by FDA under an Emergency Use Authorization (EUA).  This EUA will  remain in effect (meaning this test can be used) for the duration of the COVID-19 declaration under Section 564(b)(1) of the Act, 21 U.S.C. section 360bbb-3(b)(1), unless the authorization is terminated or revoked sooner.  Performed at [facility], [address] [place], [place] [address]   Gastrointestinal Panel by PCR , Stool     Status: None   Collection Time: 03/15/22 10:02 PM   Specimen: Rectum; Stool  Result Value Ref Range Status   Campylobacter species NOT DETECTED NOT DETECTED Final   Plesimonas shigelloides NOT DETECTED NOT DETECTED Final   Salmonella species NOT DETECTED NOT DETECTED Final   Yersinia enterocolitica NOT DETECTED NOT DETECTED Final   Vibrio species NOT DETECTED NOT DETECTED Final   Vibrio cholerae NOT DETECTED NOT DETECTED Final   Enteroaggregative E coli (EAEC) NOT DETECTED NOT DETECTED Final   Enteropathogenic E coli (EPEC) NOT DETECTED NOT DETECTED Final   Enterotoxigenic E coli (ETEC) NOT DETECTED NOT DETECTED Final   Shiga like toxin producing E coli (STEC) NOT DETECTED NOT DETECTED Final   Shigella/Enteroinvasive E coli (EIEC) NOT DETECTED NOT DETECTED Final   Cryptosporidium NOT DETECTED NOT DETECTED Final   Cyclospora cayetanensis NOT DETECTED NOT DETECTED Final   Entamoeba histolytica NOT DETECTED NOT DETECTED Final   Giardia lamblia NOT DETECTED NOT DETECTED Final   Adenovirus F40/41 NOT DETECTED NOT DETECTED Final   Astrovirus NOT DETECTED NOT DETECTED Final   Norovirus GI/GII NOT DETECTED NOT DETECTED Final   Rotavirus A NOT DETECTED NOT DETECTED Final   Sapovirus (I, II, IV, and V) NOT DETECTED NOT DETECTED Final    Comment: Performed at [facility], [address], [place], [place] [address]     Time coordinating discharge: Over 30 minutes  SIGNED:   Darliss Cheney, MD  [facility] 03/17/2022, 1:26 PM *Please note that this is a verbal dictation therefore any spelling or grammatical errors are due to the  "[place]" system interpretation. If 7PM-7AM, please contact night-coverage [email or web address]

## 2022-03-17 NOTE — Telephone Encounter (Signed)
LM for [name] to call [phone] to follow up with Dr Durenda Hurt at [facility]. They say he is considered an established patient.

## 2022-03-17 NOTE — Progress Notes (Signed)
OT Cancellation Note  Patient Details Name: PIKE SCANTLEBURY MRN: [id number] DOB: 1956-02-16   Cancelled Treatment:    Reason Eval/Treat Not Completed: OT screened, no needs identified, will sign off. Talked with patient and family in regards to OT needs. Patient independent in room with ADLs. They reports a history of balance deficits and patient uses cane and walker at night. OT will sign off as he is at baseline in regards to ADLs - will defer to PT to assess balance.  Advith Martine L Damion Kant 03/17/2022, 11:28 AM

## 2022-03-17 NOTE — TOC Transition Note (Signed)
Transition of Care [facility]) - CM/SW Discharge Note   Patient Details  Name: [name] MRN: [id number] Date of Birth: November 23, 1955  [facility] CM/SW Contact:  Lennart Pall, LCSW Phone Number: 03/17/2022, 12:20 PM   Clinical Narrative:    Met with pt and daughter this today and they are pleased with pt's progress and feeling he is at baseline with overall mobility and functioning.  No DME or f/u needs.  TOC will sign off.   Final next level of care: Home/Self Care Barriers to Discharge: Barriers Resolved   Patient Goals and CMS Choice Patient states their goals for this hospitalization and ongoing recovery are:: To return back home with home health. CMS Medicare.gov Compare Post Acute Care list provided to:: Patient Choice offered to / list presented to : Patient  Discharge Placement                       Discharge Plan and Services     Post Acute Care Choice: Durable Medical Equipment, Home Health          DME Arranged: N/A DME Agency: NA                  Social Determinants of Health (SDOH) Interventions     Readmission Risk Interventions    03/17/2022   12:19 PM 08/21/2021    9:25 AM 08/01/2021    8:41 AM  Readmission Risk Prevention Plan  Transportation Screening Complete Complete Complete  Medication Review Press photographer) Complete Complete Complete  PCP or Specialist appointment within 3-5 days of discharge Complete Complete Complete  HRI or Home Care Consult Complete Complete Complete  SW Recovery Care/Counseling Consult Complete Complete Complete  SW Consult Not Complete Comments  n/a   Palliative Care Screening Complete Not Applicable Complete  [place] Not Applicable Not Applicable Not Applicable

## 2022-03-17 NOTE — Evaluation (Signed)
Physical Therapy Evaluation Patient Details Name: [name] MRN: [id number] DOB: 09-30-1955 Today's Date: 03/17/2022  History of Present Illness  [name] is a 66 y.o. 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   Clinical Impression  [name] is 66 y.o. male admitted with above HPI and diagnosis. Patient is currently limited by functional impairments below (see PT problem list). Patient lives with his spouse and is independent at baseline but has been more limited since recent full brain radiation. Overall he is mobilizing well with good strength but is at moderate to high risk for falls based on his performance for the DGI (18/24). Patient will benefit from continued skilled PT interventions to address impairments and progress independence with mobility, recommending OPPT for balance and endurance training. Acute PT will follow and progress as able.        Recommendations for follow up therapy are one component of a multi-disciplinary discharge planning process, led by the attending physician.  Recommendations may be updated based on patient status, additional functional criteria and insurance authorization.  Follow Up Recommendations Outpatient PT      Assistance Recommended at Discharge PRN  Patient can return home with the following       Equipment Recommendations None recommended by PT  Recommendations for Other Services       Functional Status Assessment Patient has had a recent decline in their functional status and demonstrates the ability to make significant improvements in function in a reasonable and predictable amount of time.     Precautions / Restrictions  Precautions Precautions: Fall Restrictions Weight Bearing Restrictions: No      Mobility  Bed Mobility Overal bed mobility: Independent                  Transfers Overall transfer level: Modified independent                 General transfer comment: use of hands, extra time    Ambulation/Gait Ambulation/Gait assistance: Supervision Gait Distance (Feet): 400 Feet Assistive device: None Gait Pattern/deviations: WFL(Within Functional Limits), Decreased stride length, Drifts right/left Gait velocity: fair     General Gait Details: slight drift and LOB Rt/Lt however pt able to correc with postural reactions.  Stairs            Wheelchair Mobility    Modified Rankin (Stroke Patients Only)       Balance Overall balance assessment: History of Falls, Needs assistance   Sitting balance-Leahy Scale: Normal       Standing balance-Leahy Scale: Good                   Standardized Balance Assessment Standardized Balance Assessment : Dynamic Gait Index   Dynamic Gait Index Level Surface: Normal Change in Gait Speed: Normal Gait with Horizontal Head Turns: Mild Impairment Gait with Vertical Head Turns: Mild Impairment Gait and Pivot Turn: Mild Impairment Step Over Obstacle: Mild Impairment Step Around Obstacles: Mild Impairment Steps: Mild Impairment Total Score: 18       Pertinent Vitals/Pain Pain Assessment Pain Assessment: No/denies pain    Home Living Family/patient expects to be discharged to:: Private residence Living Arrangements: Spouse/significant other Available Help at Discharge: Family;Available PRN/intermittently Type of Home: House  Home Access: Stairs to enter Entrance Stairs-Rails: Psychiatric nurse of Steps: 5-8   Home Layout: Two level;Bed/bath upstairs Home Equipment: Cane - single Barista (2 wheels)      Prior Function Prior Level of Function : Independent/Modified Independent                      Hand Dominance   Dominant Hand: Left    Extremity/Trunk Assessment   Upper Extremity Assessment Upper Extremity Assessment: Overall WFL for tasks assessed    Lower Extremity Assessment Lower Extremity Assessment: Overall WFL for tasks assessed    Cervical / Trunk Assessment Cervical / Trunk Assessment: Normal  Communication   Communication: No difficulties  Cognition Arousal/Alertness: Awake/alert Behavior During Therapy: WFL for tasks assessed/performed Overall Cognitive Status: Within Functional Limits for tasks assessed                                          General Comments      Exercises     Assessment/Plan    PT Assessment Patient needs continued PT services  PT Problem List Decreased activity tolerance;Decreased balance;Decreased mobility       PT Treatment Interventions Gait training;Stair training;Functional mobility training;Balance training;Therapeutic exercise;Therapeutic activities;Neuromuscular re-education;Patient/family education    PT Goals (Current goals can be found in the Care Plan section)  Acute Rehab PT Goals Patient Stated Goal: get stronger and balance back PT Goal Formulation: With patient/family Time For Goal Achievement: 03/24/22 Potential to Achieve Goals: Good    Frequency Min 2X/week     Co-evaluation               AM-PAC PT "6 Clicks" Mobility  Outcome Measure Help needed turning from your back to your side while in a flat bed without using bedrails?: None Help needed moving from lying on your back to sitting on the side of a flat bed without using bedrails?: None Help needed moving to and from a bed to a chair (including a wheelchair)?: None Help needed standing up from a chair using your arms (e.g., wheelchair or bedside chair)?: None Help needed to walk in hospital room?: A Little Help needed climbing 3-5 steps with a railing? : A Little 6 Click Score: 22    End of  Session Equipment Utilized During Treatment: Gait belt Activity Tolerance: Patient tolerated treatment well Patient left: in bed;with call bell/phone within reach;with family/visitor present Nurse Communication: Mobility status PT Visit Diagnosis: Difficulty in walking, not elsewhere classified (R26.2);Unsteadiness on feet (R26.81)    Time: 1319-1340 PT Time Calculation (min) (ACUTE ONLY): 21 min   Charges:   PT Evaluation $PT Eval Low Complexity: 1 Low          Verner Mould, DPT Acute Rehabilitation Services Office [phone] Pager [phone]  03/17/22 4:31 PM

## 2022-03-19 ENCOUNTER — Encounter: Payer: Self-pay | Admitting: Physician Assistant

## 2022-03-19 LAB — CULTURE, BLOOD (ROUTINE X 2)
Culture: NO GROWTH
Culture: NO GROWTH
Special Requests: ADEQUATE
Special Requests: ADEQUATE

## 2022-03-22 ENCOUNTER — Other Ambulatory Visit: Payer: Self-pay

## 2022-03-22 ENCOUNTER — Encounter (HOSPITAL_BASED_OUTPATIENT_CLINIC_OR_DEPARTMENT_OTHER): Payer: Self-pay

## 2022-03-22 ENCOUNTER — Emergency Department (HOSPITAL_BASED_OUTPATIENT_CLINIC_OR_DEPARTMENT_OTHER)
Admission: EM | Admit: 2022-03-22 | Discharge: 2022-03-22 | Disposition: A | Discharge status: Home / Self Care | Payer: Medicare Other | Attending: Emergency Medicine | Admitting: Emergency Medicine

## 2022-03-22 ENCOUNTER — Ambulatory Visit (INDEPENDENT_AMBULATORY_CARE_PROVIDER_SITE_OTHER): Payer: Medicare Other

## 2022-03-22 VITALS — BP 93/70 | HR 130 | Ht 69.0 in | Wt 152.0 lb

## 2022-03-22 DIAGNOSIS — N189 Chronic kidney disease, unspecified: Secondary | ICD-10-CM | POA: Diagnosis not present

## 2022-03-22 DIAGNOSIS — R031 Nonspecific low blood-pressure reading: Secondary | ICD-10-CM

## 2022-03-22 DIAGNOSIS — I2510 Atherosclerotic heart disease of native coronary artery without angina pectoris: Secondary | ICD-10-CM | POA: Insufficient documentation

## 2022-03-22 DIAGNOSIS — Z79899 Other long term (current) drug therapy: Secondary | ICD-10-CM | POA: Diagnosis not present

## 2022-03-22 DIAGNOSIS — Z8589 Personal history of malignant neoplasm of other organs and systems: Secondary | ICD-10-CM | POA: Diagnosis not present

## 2022-03-22 DIAGNOSIS — R000 Tachycardia, unspecified: Secondary | ICD-10-CM

## 2022-03-22 DIAGNOSIS — I129 Hypertensive chronic kidney disease with stage 1 through stage 4 chronic kidney disease, or unspecified chronic kidney disease: Secondary | ICD-10-CM | POA: Insufficient documentation

## 2022-03-22 LAB — COMPREHENSIVE METABOLIC PANEL
ALT: 31 U/L (ref 0–44)
AST: 27 U/L (ref 15–41)
Albumin: 3.7 g/dL (ref 3.5–5.0)
Alkaline Phosphatase: 97 U/L (ref 38–126)
Anion gap: 8 (ref 5–15)
BUN: 15 mg/dL (ref 8–23)
CO2: 26 mmol/L (ref 22–32)
Calcium: 9.8 mg/dL (ref 8.9–10.3)
Chloride: 101 mmol/L (ref 98–111)
Creatinine, Ser: 0.61 mg/dL (ref 0.61–1.24)
GFR, Estimated: >60 mL/min (ref >60)
Glucose, Bld: 92 mg/dL (ref 70–99)
Potassium: 4.2 mmol/L (ref 3.5–5.1)
Sodium: 135 mmol/L (ref 135–145)
Total Bilirubin: 0.5 mg/dL (ref 0.3–1.2)
Total Protein: 7 g/dL (ref 6.5–8.1)

## 2022-03-22 LAB — CBC WITH DIFFERENTIAL/PLATELET
Abs Immature Granulocytes: 0.11 10*3/uL — ABNORMAL HIGH (ref 0.00–0.07)
Basophils Absolute: 0 10*3/uL (ref 0.0–0.1)
Basophils Relative: 0 %
Eosinophils Absolute: 0.1 10*3/uL (ref 0.0–0.5)
Eosinophils Relative: 1 %
HCT: 31.4 % — ABNORMAL LOW (ref 39.0–52.0)
Hemoglobin: 10.1 g/dL — ABNORMAL LOW (ref 13.0–17.0)
Immature Granulocytes: 1 %
Lymphocytes Relative: 7 %
Lymphs Abs: 0.7 10*3/uL (ref 0.7–4.0)
MCH: 32.5 pg (ref 26.0–34.0)
MCHC: 32.2 g/dL (ref 30.0–36.0)
MCV: 101 fL — ABNORMAL HIGH (ref 80.0–100.0)
Monocytes Absolute: 1.4 10*3/uL — ABNORMAL HIGH (ref 0.1–1.0)
Monocytes Relative: 15 %
Neutro Abs: 6.8 10*3/uL (ref 1.7–7.7)
Neutrophils Relative %: 76 %
Platelets: 246 10*3/uL (ref 150–400)
RBC: 3.11 MIL/uL — ABNORMAL LOW (ref 4.22–5.81)
RDW: 18.9 % — ABNORMAL HIGH (ref 11.5–15.5)
WBC: 9 10*3/uL (ref 4.0–10.5)
nRBC: 0 % (ref 0.0–0.2)

## 2022-03-22 NOTE — Patient Instructions (Signed)
Please go straight to ED at [facility] to be evaluated for low blood pressure and high heart rate.

## 2022-03-22 NOTE — Discharge Instructions (Signed)
Check your blood pressure tomorrow morning before taking your Metoprolol. If your blood pressure is less than 484 (systolic/top number), call your doctor's office before taking your medication.

## 2022-03-22 NOTE — Progress Notes (Addendum)
   Nurse Visit   Date of Encounter: 03/22/2022 ID: [name], DOB 29-Oct-1955, MRN [id number]  PCP:  [name], MD   [facility] Providers Cardiologist:  Larae Grooms, MD {   Visit Details   VS:  BP 93/70 (BP Location: Right Arm, Patient Position: Sitting, Cuff Size: Normal) Comment: machine  Pulse (!) 130   Ht 5\' 9"  (1.753 m)   Wt 152 lb (68.9 kg)   SpO2 98%   BMI 22.45 kg/m  , BMI Body mass index is 22.45 kg/m.  Wt Readings from Last 3 Encounters:  03/22/22 152 lb (68.9 kg)  03/15/22 151 lb 6.4 oz (68.7 kg)  03/09/22 153 lb 6.4 oz (69.6 kg)     Reason for visit: BP check   Performed today: Vitals, EKG, Provider consulted:Dr. Johney Frame, and Education Changes (medications, testing, etc.) : Per Dr. Johney Frame patient needs to be evaluated in the ED to determine the cause of low BP and high HR. See if it is due to dehydration or possible bleed due to restarting xarelto. Requested patient go to ER, [facility] ER or any of the [facility] ER locations. Length of Visit: 60  minutes    Medications Adjustments/Labs and Tests Ordered: No orders of the defined types were placed in this encounter.  No orders of the defined types were placed in this encounter.    Signed, Michaelyn Barter, RN  03/22/2022 3:02 PM  Patient seen and agree with [name] as detailed above.  Patient is hypotensive and tachycardic in clinic. Possibly related to dehydration but want to ensure no bleeding given recent anemia in the setting of xarelto use. Also want to ensure no evidence of underlying infection given immunocompromised status. Discussed with the patient and his daughter and they are amenable to go to the ER for further evaluation.  Anthony Kaufman, MD

## 2022-03-22 NOTE — ED Triage Notes (Signed)
Patient here POV from Home.  Patient had Routine Visit with Cardiologist Visit today and was found to be Hypotensive and Tachycardic. Sent for Assessment.  Recently Admitted and Discharged for Delirium this Past Week.  No New Pain. No SOB. No Known Fevers. No Current Chemotherapy. Currently started taking Xarelto again in the past week for Prophylaxis.   NAD Noted during Triage. A&OX4. GCS 15. Ambulatory with Cane.

## 2022-03-22 NOTE — ED Provider Notes (Signed)
[place] EMERGENCY DEPT Provider Note   CSN: [id number] Arrival date & time: 03/22/22  1547     History  Chief Complaint  Patient presents with   Hypotension    [name] is a 66 y.o. male.  66 year old male with history of CAD, ischemic cardiomyopathy, hypertension, CVA, large cell neuroendocrine carcinoma with mets to brain, on palliative chemo, CKD, paroxysmal A-fib, on Xarelto.  Patient states he went to his doctor's office today for blood pressure check.  Patient was found to have a blood pressure in the 90s with a heart rate of 130 and was sent to the ER for evaluation.  Patient states that he feels at baseline I did not feel it was necessary to, but came because his doctor insisted. He denies fevers, chills, changes in bowel or bladder habits, denies dark or tarry/bloody stools.        Home Medications Prior to Admission medications   Medication Sig Start Date End Date Taking? Authorizing Provider  acetaminophen (TYLENOL) 500 MG tablet Take 1,000 mg by mouth every 6 (six) hours as needed for mild pain, fever or headache.    [provider]  benzonatate (TESSALON) 100 MG capsule TAKE 1 CAPSULE(100 MG) BY MOUTH THREE TIMES DAILY AS NEEDED FOR COUGH Patient taking differently: Take 100 mg by mouth 3 (three) times daily as needed for cough. 03/01/22   Heilingoetter, Cassandra L, PA-C  bisacodyl (DULCOLAX) 5 MG EC tablet Take 5 mg by mouth daily as needed (constipation).    [provider]  dexamethasone (DECADRON) 4 MG tablet Take 1 tablet (4 mg total) by mouth daily. 03/05/22   Kyung Rudd, MD  docusate sodium (STOOL SOFTENER) 100 MG capsule Take 100 mg by mouth 2 (two) times daily as needed (constipation).    [provider]  DULoxetine (CYMBALTA) 30 MG capsule Take 1 capsule (30 mg total) by mouth 2 (two) times daily. 02/02/22   Pickenpack-Cousar, Carlena Sax, NP  folic acid (FOLVITE) 1 MG tablet Take 1 tablet (1 mg total) by mouth  daily. 09/17/21   Curt Bears, MD  HYDROmorphone (DILAUDID) 8 MG tablet Take 1 tablet by mouth every 6 hours as needed for severe pain. 03/12/22   Pickenpack-Cousar, Carlena Sax, NP  lidocaine-prilocaine (EMLA) cream Apply to the Port-A-Cath site 30-60-minute before treatment Patient taking differently: Apply 1 Application topically once as needed (prior to port access - 30-60 minutes before treatment). 11/09/21   Curt Bears, MD  LORazepam (ATIVAN) 1 MG tablet Take 1 tablet (1 mg total) by mouth every 8 (eight) hours as needed for anxiety. 02/25/22   Pickenpack-Cousar, Carlena Sax, NP  magnesium oxide (MAG-OX) 400 MG tablet Take 400 mg by mouth daily in the afternoon.    [provider]  metoprolol succinate (TOPROL-XL) 25 MG 24 hr tablet Take 0.5 tablets (12.5 mg total) by mouth daily. 03/09/22   Elgie Collard, PA-C  morphine (MS CONTIN) 60 MG 12 hr tablet Take 1 tablet by mouth every 12  hours. 03/19/22   Pickenpack-Cousar, Carlena Sax, NP  ondansetron (ZOFRAN) 8 MG tablet Take 1 tablet by mouth every 8 hours as needed for nausea or vomiting. Starting 3 days after chemotherapy 01/11/22   Pickenpack-Cousar, Carlena Sax, NP  tiZANidine (ZANAFLEX) 2 MG tablet Take 1 tablet (2 mg total) by mouth at bedtime as needed for muscle spasms. 02/02/22   Pickenpack-Cousar, Carlena Sax, NP  XARELTO 20 MG TABS tablet Take 20 mg by mouth every evening. 06/01/21  [provider]      Allergies    Prednisone, Tetanus toxoids, Wellbutrin [bupropion], and Varenicline    Review of Systems   Review of Systems Negative except as per HPI Physical Exam Updated Vital Signs BP 113/64   Pulse 89   Temp (!) 97.5 F (36.4 C)   Resp 19   Ht 5\' 9"  (1.753 m)   Wt 68.9 kg   SpO2 100%   BMI 22.43 kg/m  Physical Exam Vitals and nursing note reviewed.  Constitutional:      General: He is not in acute distress.    Appearance: He is well-developed. He is not diaphoretic.  HENT:     Head: Normocephalic and  atraumatic.  Cardiovascular:     Rate and Rhythm: Normal rate and regular rhythm.     Pulses: Normal pulses.     Heart sounds: Normal heart sounds.  Pulmonary:     Effort: Pulmonary effort is normal.     Breath sounds: Normal breath sounds.  Abdominal:     Palpations: Abdomen is soft.     Tenderness: There is no abdominal tenderness.  Skin:    General: Skin is warm and dry.     Findings: No erythema or rash.  Neurological:     Mental Status: He is alert and oriented to person, place, and time.  Psychiatric:        Behavior: Behavior normal.     ED Results / Procedures / Treatments   Labs (all labs ordered are listed, but only abnormal results are displayed) Labs Reviewed  CBC WITH DIFFERENTIAL/PLATELET - Abnormal; Notable for the following components:      Result Value   RBC 3.11 (*)    Hemoglobin 10.1 (*)    HCT 31.4 (*)    MCV 101.0 (*)    RDW 18.9 (*)    Monocytes Absolute 1.4 (*)    Abs Immature Granulocytes 0.11 (*)    All other components within normal limits  COMPREHENSIVE METABOLIC PANEL    EKG None  Radiology No results found.  Procedures Procedures    Medications Ordered in ED Medications - No data to display  ED Course/ Medical Decision Making/ A&P                           Medical Decision Making Amount and/or Complexity of Data Reviewed Labs: ordered.   66 year old male with history as above presents at the recommendation of his cardiologist office after he was found to have a low blood pressure reading and was tachycardic in office today.  He is in the ER with improvement in his vital signs.  He feels at baseline.  Reports normal p.o. intake and states his urine has been an appropriate color today.  He does not have any dark or tarry stools no concerns for GI bleeding.  Patient's labs were assessed and are reassuring compared to baseline labs from his prior admission.  His vitals remained stable and he is discharged with a blood pressure of  113/64 and a heart rate of 89.  Patient declines further work-up today in the ER and would prefer to monitor his vitals from home and call his doctor as needed.  He does take a 12.5 mg metoprolol every morning.  He questions why he is on this medication and if this is part of why his blood pressure may be too low.  Advised the patient to check his blood pressure tomorrow morning  prior to taking his medication and if his blood pressure is too low he should contact his doctor before taking the medication for further advice.        Final Clinical Impression(s) / ED Diagnoses Final diagnoses:  Low blood pressure reading    Rx / DC Orders ED Discharge Orders     None         Tacy Learn, PA-C 03/22/22 2045    Malvin Johns, MD 03/22/22 2312

## 2022-03-23 ENCOUNTER — Other Ambulatory Visit: Payer: Self-pay | Admitting: Internal Medicine

## 2022-03-23 ENCOUNTER — Other Ambulatory Visit: Payer: Self-pay | Admitting: Physician Assistant

## 2022-03-23 ENCOUNTER — Telehealth: Payer: Self-pay

## 2022-03-23 DIAGNOSIS — R059 Cough, unspecified: Secondary | ICD-10-CM

## 2022-03-23 MED ORDER — BENZONATATE 100 MG PO CAPS: 100.0000 mg | ORAL_CAPSULE | Freq: Three times a day (TID) | ORAL | 2 refills | Status: DC | PRN

## 2022-03-23 NOTE — Telephone Encounter (Signed)
Pt called requesting MS Conitn refill, med is available at [facility], attempted to call pt back, LVM and call back number.

## 2022-03-29 ENCOUNTER — Other Ambulatory Visit (HOSPITAL_COMMUNITY): Payer: Self-pay

## 2022-03-29 ENCOUNTER — Other Ambulatory Visit: Payer: Self-pay | Admitting: Nurse Practitioner

## 2022-03-29 ENCOUNTER — Other Ambulatory Visit: Payer: Self-pay

## 2022-03-29 DIAGNOSIS — Z515 Encounter for palliative care: Secondary | ICD-10-CM

## 2022-03-29 DIAGNOSIS — G893 Neoplasm related pain (acute) (chronic): Secondary | ICD-10-CM

## 2022-03-29 DIAGNOSIS — C7A1 Malignant poorly differentiated neuroendocrine tumors: Secondary | ICD-10-CM

## 2022-03-29 MED ORDER — HYDROMORPHONE HCL 8 MG PO TABS
8.0000 mg | ORAL_TABLET | Freq: Four times a day (QID) | ORAL | 0 refills | Status: DC | PRN
  Filled 2022-03-29: qty 60, 15d supply, fill #0

## 2022-03-29 MED ORDER — LORAZEPAM 1 MG PO TABS
1.0000 mg | ORAL_TABLET | Freq: Three times a day (TID) | ORAL | 0 refills | Status: DC | PRN
  Filled 2022-03-29: qty 60, 20d supply, fill #0

## 2022-03-29 MED ORDER — DULOXETINE HCL 30 MG PO CPEP
30.0000 mg | ORAL_CAPSULE | Freq: Two times a day (BID) | ORAL | 1 refills | Status: DC
  Filled 2022-03-29: qty 60, 30d supply, fill #0

## 2022-03-29 NOTE — Progress Notes (Signed)
Pt called in for refill of pain meds, see new orders.

## 2022-03-30 ENCOUNTER — Encounter: Payer: Self-pay | Admitting: Nurse Practitioner

## 2022-03-30 ENCOUNTER — Inpatient Hospital Stay (HOSPITAL_BASED_OUTPATIENT_CLINIC_OR_DEPARTMENT_OTHER): Payer: Medicare Other | Admitting: Internal Medicine

## 2022-03-30 ENCOUNTER — Telehealth: Payer: Self-pay | Admitting: Nurse Practitioner

## 2022-03-30 ENCOUNTER — Inpatient Hospital Stay: Payer: Medicare Other | Attending: Physician Assistant

## 2022-03-30 ENCOUNTER — Encounter: Payer: Self-pay | Admitting: Internal Medicine

## 2022-03-30 ENCOUNTER — Inpatient Hospital Stay (HOSPITAL_BASED_OUTPATIENT_CLINIC_OR_DEPARTMENT_OTHER): Payer: Medicare Other | Admitting: Nurse Practitioner

## 2022-03-30 VITALS — BP 123/84 | HR 111 | Temp 98.2°F | Resp 16 | Wt 147.9 lb

## 2022-03-30 DIAGNOSIS — C3491 Malignant neoplasm of unspecified part of right bronchus or lung: Secondary | ICD-10-CM

## 2022-03-30 DIAGNOSIS — Z87891 Personal history of nicotine dependence: Secondary | ICD-10-CM | POA: Diagnosis not present

## 2022-03-30 DIAGNOSIS — K5903 Drug induced constipation: Secondary | ICD-10-CM

## 2022-03-30 DIAGNOSIS — R5382 Chronic fatigue, unspecified: Secondary | ICD-10-CM

## 2022-03-30 DIAGNOSIS — G893 Neoplasm related pain (acute) (chronic): Secondary | ICD-10-CM

## 2022-03-30 DIAGNOSIS — R634 Abnormal weight loss: Secondary | ICD-10-CM

## 2022-03-30 DIAGNOSIS — Z79899 Other long term (current) drug therapy: Secondary | ICD-10-CM | POA: Insufficient documentation

## 2022-03-30 DIAGNOSIS — Z515 Encounter for palliative care: Secondary | ICD-10-CM | POA: Diagnosis not present

## 2022-03-30 DIAGNOSIS — R530 Neoplastic (malignant) related fatigue: Secondary | ICD-10-CM | POA: Diagnosis not present

## 2022-03-30 DIAGNOSIS — C7A8 Other malignant neuroendocrine tumors: Secondary | ICD-10-CM | POA: Insufficient documentation

## 2022-03-30 DIAGNOSIS — Z7189 Other specified counseling: Secondary | ICD-10-CM

## 2022-03-30 DIAGNOSIS — C7A1 Malignant poorly differentiated neuroendocrine tumors: Secondary | ICD-10-CM

## 2022-03-30 DIAGNOSIS — Z95828 Presence of other vascular implants and grafts: Secondary | ICD-10-CM

## 2022-03-30 DIAGNOSIS — C7B8 Other secondary neuroendocrine tumors: Secondary | ICD-10-CM | POA: Diagnosis not present

## 2022-03-30 DIAGNOSIS — C7931 Secondary malignant neoplasm of brain: Secondary | ICD-10-CM

## 2022-03-30 LAB — CMP (CANCER CENTER ONLY)
ALT: 27 U/L (ref 0–44)
AST: 31 U/L (ref 15–41)
Albumin: 3.5 g/dL (ref 3.5–5.0)
Alkaline Phosphatase: 107 U/L (ref 38–126)
Anion gap: 7 (ref 5–15)
BUN: 16 mg/dL (ref 8–23)
CO2: 27 mmol/L (ref 22–32)
Calcium: 9.9 mg/dL (ref 8.9–10.3)
Chloride: 102 mmol/L (ref 98–111)
Creatinine: 0.78 mg/dL (ref 0.61–1.24)
GFR, Estimated: >60 mL/min (ref >60)
Glucose, Bld: 105 mg/dL — ABNORMAL HIGH (ref 70–99)
Potassium: 4.1 mmol/L (ref 3.5–5.1)
Sodium: 136 mmol/L (ref 135–145)
Total Bilirubin: 0.6 mg/dL (ref 0.3–1.2)
Total Protein: 7.8 g/dL (ref 6.5–8.1)

## 2022-03-30 LAB — CBC WITH DIFFERENTIAL (CANCER CENTER ONLY)
Abs Immature Granulocytes: 0.05 10*3/uL (ref 0.00–0.07)
Basophils Absolute: 0 10*3/uL (ref 0.0–0.1)
Basophils Relative: 1 %
Eosinophils Absolute: 0.1 10*3/uL (ref 0.0–0.5)
Eosinophils Relative: 1 %
HCT: 31.5 % — ABNORMAL LOW (ref 39.0–52.0)
Hemoglobin: 10.4 g/dL — ABNORMAL LOW (ref 13.0–17.0)
Immature Granulocytes: 1 %
Lymphocytes Relative: 9 %
Lymphs Abs: 0.8 10*3/uL (ref 0.7–4.0)
MCH: 32.4 pg (ref 26.0–34.0)
MCHC: 33 g/dL (ref 30.0–36.0)
MCV: 98.1 fL (ref 80.0–100.0)
Monocytes Absolute: 1.1 10*3/uL — ABNORMAL HIGH (ref 0.1–1.0)
Monocytes Relative: 13 %
Neutro Abs: 6.6 10*3/uL (ref 1.7–7.7)
Neutrophils Relative %: 75 %
Platelet Count: 307 10*3/uL (ref 150–400)
RBC: 3.21 MIL/uL — ABNORMAL LOW (ref 4.22–5.81)
RDW: 16.8 % — ABNORMAL HIGH (ref 11.5–15.5)
WBC Count: 8.7 10*3/uL (ref 4.0–10.5)
nRBC: 0 % (ref 0.0–0.2)

## 2022-03-30 LAB — TSH: TSH: 1.711 u[IU]/mL (ref 0.350–4.500)

## 2022-03-30 MED ORDER — HEPARIN SOD (PORK) LOCK FLUSH 100 UNIT/ML IV SOLN
500.0000 [IU] | Freq: Once | INTRAVENOUS | Status: AC
  Administered 2022-03-30: 500 [IU]

## 2022-03-30 MED ORDER — SODIUM CHLORIDE 0.9% FLUSH
10.0000 mL | Freq: Once | INTRAVENOUS | Status: AC
  Administered 2022-03-30: 10 mL

## 2022-03-30 NOTE — Therapy (Signed)
OUTPATIENT PHYSICAL THERAPY NEURO EVALUATION   Patient Name: [name] MRN: [id number] DOB:01-05-1956, 66 y.o., male Today's Date: 03/31/2022   PCP: Zack Seal REFERRING PROVIDER: Darliss Cheney   PT End of Session - 03/31/22 0842     Visit Number 1    Date for PT Re-Evaluation 06/16/22    Progress Note Due on Visit 10    PT Start Time 0845    PT Stop Time 0930    PT Time Calculation (min) 45 min    Equipment Utilized During Treatment Gait belt    Activity Tolerance Patient tolerated treatment well    Behavior During Therapy WFL for tasks assessed/performed             Past Medical History:  Diagnosis Date   Anemia    Anxiety    Arthritis    Basal cell carcinoma (BCC) of forehead    CAD (coronary artery disease)    a. 10/2015 ant STEMI >> LHC with 3 v CAD; oLAD tx with POBA >> emergent CABG. b. Multiple evals since that time, early graft failure of SVG-RCA by cath 03/2016. c. 2/19 PCI/DES x1 to pRCA, normal EF.   Carotid artery disease ([place])    a. 40-59% BICA 02/2018.   Depression    Dyspnea    Ectopic atrial tachycardia (HCC)    Esophageal reflux    eosinophil esophagitis   Family history of adverse reaction to anesthesia    "sister has PONV" (06/21/2017)   Former tobacco use    Gout    Hepatitis C    "treated and cured" (06/21/2017)   High cholesterol    History of blood transfusion    History of kidney stones    Hypertension    Ischemic cardiomyopathy    a. EF 25-30% at intraop TEE 4/17  //  b. Limited Echo 5/17 - EF 45-50%, mild ant HK. c. EF 55-65% by cath 09/2017.   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 HR dropping into 40s in 02/2016 -> BB reduced.   Stroke [facility]) 10/2016   "small one; sometimes my memory/cognitive issues" (06/21/2017)   Symptomatic hypotension    a. 02/2016 ER visit -> meds reduced.   Syncope     Wears dentures    Wears glasses    Past Surgical History:  Procedure Laterality Date   25 GAUGE PARS PLANA VITRECTOMY WITH 20 GAUGE MVR PORT  12/31/2020   Procedure: 25 GAUGE PARS PLANA VITRECTOMY WITH 20 GAUGE MVR PORT;  Surgeon: [name], MD;  Location: [facility];  Service: Thoracic;;   ANTERIOR CERVICAL DECOMP/DISCECTOMY FUSION N/A 10/17/2018   Procedure: Anterior Cervical Decompression Fusion - Cervical seven -Thoracic one;  Surgeon: Consuella Lose, MD;  Location: [facility];  Service: Neurosurgery;  Laterality: N/A;   BASAL CELL CARCINOMA EXCISION     "forehead   BIOPSY  07/20/2019   Procedure: BIOPSY;  Surgeon: Carol Ada, MD;  [facility];  Service: Endoscopy;;   BIOPSY OF MEDIASTINAL MASS Right 07/29/2021   Procedure: RIGHT CHEST WALL MASS BIOPSY;  Surgeon: [name], MD;  Location: [facility];  Service: Thoracic;  Laterality: Right;   BRONCHIAL BIOPSY  12/24/2020   Procedure: BRONCHIAL BIOPSIES;  Surgeon: Garner Nash, DO;  Location: [facility];  Service: Pulmonary;;   BRONCHIAL BRUSHINGS  12/24/2020  Procedure: BRONCHIAL BRUSHINGS;  Surgeon: Garner Nash, DO;  Location: [facility];  Service: Pulmonary;;   BRONCHIAL NEEDLE ASPIRATION BIOPSY  12/24/2020   Procedure: BRONCHIAL NEEDLE ASPIRATION BIOPSIES;  Surgeon: Garner Nash, DO;  Location: [facility];  Service: Pulmonary;;   CARDIAC CATHETERIZATION N/A 11/28/2015   Procedure: Left Heart Cath and Coronary Angiography;  Surgeon: [name], MD;  Location: [facility];  Service: Cardiovascular;  Laterality: N/A;   CARDIAC CATHETERIZATION N/A 11/28/2015   Procedure: Coronary Balloon Angioplasty;  Surgeon: [name], MD;  Location: [facility];  Service: Cardiovascular;  Laterality: N/A;  ostial LAD   CARDIAC CATHETERIZATION N/A 11/28/2015   Procedure: Coronary/Graft Angiography;  Surgeon: [name], MD;  Location: [facility];  Service:  Cardiovascular;  Laterality: N/A;  coronaries only    CARDIAC CATHETERIZATION N/A 04/21/2016   Procedure: Left Heart Cath and Coronary Angiography;  Surgeon: Wellington Hampshire, MD;  Location: [facility];  Service: Cardiovascular;  Laterality: N/A;   CARDIAC CATHETERIZATION N/A 06/14/2016   Procedure: Left Heart Cath and Cors/Grafts Angiography;  Surgeon: Lorretta Harp, MD;  Location: [facility];  Service: Cardiovascular;  Laterality: N/A;   CARDIAC CATHETERIZATION N/A 09/08/2016   Procedure: Left Heart Cath and Cors/Grafts Angiography;  Surgeon: Wellington Hampshire, MD;  Location: [facility];  Service: Cardiovascular;  Laterality: N/A;   CARDIAC CATHETERIZATION     CORONARY ARTERY BYPASS GRAFT N/A 11/28/2015   Procedure: CORONARY ARTERY BYPASS GRAFTING (CABG) TIMES FIVE USING LEFT INTERNAL MAMMARY ARTERY AND RIGHT GREATER SAPHENOUS,VIEN HARVEATED BY ENDOVIEN, INTRAOPPRATIVE TEE;  Surgeon: Gaye Pollack, MD;  Location: [facility];  Service: Open Heart Surgery;  Laterality: N/A;   CORONARY STENT INTERVENTION N/A 10/05/2017   Procedure: CORONARY STENT INTERVENTION;  Surgeon: [name], MD;  Location: [facility];  Service: Cardiovascular;  Laterality: N/A;   ESOPHAGOGASTRODUODENOSCOPY (EGD) WITH PROPOFOL N/A 07/20/2019   Procedure: ESOPHAGOGASTRODUODENOSCOPY (EGD) WITH PROPOFOL;  Surgeon: Carol Ada, MD;  [facility];  Service: Endoscopy;  Laterality: N/A;   ESOPHAGOGASTRODUODENOSCOPY (EGD) WITH PROPOFOL N/A 01/30/2021   Procedure: ESOPHAGOGASTRODUODENOSCOPY (EGD) WITH PROPOFOL;  Surgeon: Carol Ada, MD;  [facility];  Service: Endoscopy;  Laterality: N/A;   FIDUCIAL MARKER PLACEMENT  12/24/2020   Procedure: FIDUCIAL DYE MARKER PLACEMENT;  Surgeon: Garner Nash, DO;  Location: [facility];  Service: Pulmonary;;   HUMERUS SURGERY Right 1969   "tumor inside bone; filled it w/bone chips"   INTERCOSTAL NERVE BLOCK Right 12/24/2020    Procedure: INTERCOSTAL NERVE BLOCK;  Surgeon: [name], MD;  Location: [facility];  Service: Thoracic;  Laterality: Right;   IR IMAGING GUIDED PORT INSERTION  02/06/2021   LEFT HEART CATH AND CORS/GRAFTS ANGIOGRAPHY N/A 03/11/2017   Procedure: Left Heart Cath and Cors/Grafts Angiography;  Surgeon: Leonie Man, MD;  Location: [facility];  Service: Cardiovascular;  Laterality: N/A;   LEFT HEART CATH AND CORS/GRAFTS ANGIOGRAPHY N/A 10/05/2017   Procedure: LEFT HEART CATH AND CORS/GRAFTS ANGIOGRAPHY;  Surgeon: [name], MD;  Location: [facility];  Service: Cardiovascular;  Laterality: N/A;   LEFT HEART CATH AND CORS/GRAFTS ANGIOGRAPHY N/A 04/11/2019   Procedure: LEFT HEART CATH AND CORS/GRAFTS ANGIOGRAPHY;  Surgeon: [name], MD;  Location: [facility];  Service: Cardiovascular;  Laterality: N/A;   NODE DISSECTION Right 12/24/2020   Procedure: NODE DISSECTION;  Surgeon: [name], MD;  [facility];  Service: Thoracic;  Laterality: Right;   PERIPHERAL VASCULAR CATHETERIZATION N/A 06/14/2016   Procedure: Lower Extremity Angiography;  Surgeon: Lorretta Harp, MD;  Location: [facility];  Service: Cardiovascular;  Laterality: N/A;   VIDEO BRONCHOSCOPY WITH ENDOBRONCHIAL NAVIGATION Right 12/24/2020   Procedure: VIDEO BRONCHOSCOPY WITH ENDOBRONCHIAL NAVIGATION;  Surgeon: Garner Nash, DO;  Location: [facility];  Service: Pulmonary;  Laterality: Right;   VIDEO BRONCHOSCOPY WITH ENDOBRONCHIAL ULTRASOUND N/A 12/24/2020   Procedure: VIDEO BRONCHOSCOPY WITH ENDOBRONCHIAL ULTRASOUND;  Surgeon: Garner Nash, DO;  Location: [facility];  Service: Pulmonary;  Laterality: N/A;   VIDEO BRONCHOSCOPY WITH INSERTION OF INTERBRONCHIAL VALVE (IBV) N/A 12/31/2020   Procedure: VIDEO BRONCHOSCOPY WITH INSERTION OF INTERBRONCHIAL VALVE (IBV).VALVE IN CARTRIDGE 77mm,9mm. CHEST TUBE PLACEMENT.;  Surgeon: [name], MD;  [facility];   Service: Thoracic;  Laterality: N/A;   VIDEO BRONCHOSCOPY WITH INSERTION OF INTERBRONCHIAL VALVE (IBV) N/A 07/29/2021   Procedure: VIDEO BRONCHOSCOPY WITH REMOVAL OF INTERBRONCHIAL VALVE (IBV);  Surgeon: [name], MD;  Location: [facility];  Service: Thoracic;  Laterality: N/A;   Patient Active Problem List   Diagnosis Date Noted   Acute metabolic encephalopathy 34/19/3790   AMS (altered mental status) 03/14/2022   Blurry vision, bilateral 10/08/2021   Leukopenia 10/08/2021   Wound of back 10/06/2021   Hyponatremia 10/03/2021   Cancer-related breakthrough pain 10/02/2021   Encounter for antineoplastic immunotherapy 09/29/2021   Cancer associated pain 09/15/2021   Constipation 09/15/2021   Right-sided chest wall pain 08/29/2021   Malnutrition of moderate degree 08/20/2021   Atrial tachycardia ([place]) 08/18/2021   Lung cancer ([place]) 07/29/2021   Hemoptysis 06/06/2021   History of pulmonary embolus (PE) 04/29/2021   Anemia 04/29/2021   Hypotension due to hypovolemia 04/28/2021   Hypomagnesemia 04/08/2021   Port-A-Cath in place 04/07/2021   Protein-calorie malnutrition, moderate ([place]) 03/25/2021   PNA (pneumonia) 03/22/2021   Community acquired pneumonia 03/22/2021   Sepsis ([place]) 03/22/2021   CKD (chronic kidney disease), stage III ([place]) 03/22/2021   Hypoxia 02/28/2021   Dyspnea 02/28/2021   Acute renal failure superimposed on stage 3a chronic kidney disease ([place]) 02/25/2021   AF (paroxysmal atrial fibrillation) ([place]) 02/25/2021   Pleural effusion 02/25/2021   Neutropenia, drug-induced ([place]) 02/24/2021   Positive blood cultures 02/17/2021   Positive blood culture 02/16/2021   Right flank pain 02/09/2021   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)  01/14/2021   Acute back pain with sciatica 01/12/2021   Allergic rhinitis 01/12/2021   Amnesia 01/12/2021   Kidney stone 01/12/2021   Sciatica 01/12/2021   Bipolar disorder ([place]) 01/12/2021   S/P lobectomy of lung 12/24/2020   Solitary lung nodule 11/28/2020   Chest pain 11/07/2019   Gastroesophageal reflux disease    Cervical radiculopathy 10/17/2018   Preoperative clearance 10/04/2018   Palpitations    Coronary artery disease involving coronary bypass graft of native heart with angina pectoris ([place])    Transient loss of consciousness 03/24/2018   Ectopic atrial tachycardia ([place]) 02/09/2018   Central chest pain 03/10/2017   Family hx-stroke 11/10/2016   Stroke-like episode ([place]) - R brain, s/p tPA 11/09/2016   Unstable angina ([place]) 09/07/2016   Claudication of both lower extremities ([place])    Pure hypercholesterolemia    Tobacco abuse disorder  CAD of autologous artery bypass graft without angina    Chest pain at rest 06/10/2016   Abnormal nuclear stress test - HIGH RISK 04/20/2016   Old MI (myocardial infarction)    Essential hypertension 02/26/2016   Ischemic cardiomyopathy 12/25/2015   Hyperlipidemia LDL goal <70 12/25/2015   Mild tobacco abuse in early remission 11/28/2015   Coronary artery disease involving native coronary artery of native heart with angina pectoris ([place]) 11/28/2015   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   Acute MI anterior wall first episode care [facility])    Precordial chest pain 03/07/2015   Gout attack 03/07/2015   Mixed bipolar I disorder ([place]) 03/07/2015   Fibromyalgia 07/09/2014   Gout 07/09/2014   Anxiety 07/09/2014   Depression 07/09/2014   Hepatitis C 94/85/4627   Eosinophilic esophagitis 03/50/0938    ONSET DATE: April 2021  REFERRING DIAG: R26.89  THERAPY DIAG:  Muscle weakness (generalized)  Balance problems  Other abnormalities of gait and mobility  Other lack of coordination  Rationale for Evaluation and Treatment  Rehabilitation  SUBJECTIVE:                                                                                                                                                                                              SUBJECTIVE STATEMENT: I was here before for back pain and you guys put needles in my back and it worked great. I was diagnosed with lung cancer in April 2021 and lost a lot weight. I am here for balance and weakness issues.     PERTINENT HISTORY: fibromyaglia, Afib, cancer, mixed bipolar disorder, CAD, HTN, unstable angina, stroke  PAIN:  Are you having pain? Yes: NPRS scale: 5/10 Pain location: R abdomen and radiates along trunk into back Pain description: constant, sharp, nagging Aggravating factors: "nothing really" Relieving factors: pain medication  PRECAUTIONS: Fall  WEIGHT BEARING RESTRICTIONS No  FALLS: Has patient fallen in last 6 months? Yes. Number of falls at least 10  LIVING ENVIRONMENT: Lives with: lives with their spouse Lives in: House/apartment Stairs: Yes: Internal: 13 steps; can reach both and External: 6 steps; bilateral but cannot reach both Has following equipment at home: Single point cane, Walker - 2 wheeled, and Grab bars  PLOF: Independent with basic ADLs and Independent with household mobility with device  PATIENT GOALS keep myself from getting no worse  OBJECTIVE:   DIAGNOSTIC FINDINGS: IMPRESSION: Substantially decreased metastatic burden with 3 residual stable or smaller lesions. No acute abnormality.  COGNITION: Overall cognitive status: Within functional limits for tasks assessed and History of cognitive impairments - at baseline   SENSATION: [facility]  COORDINATION: Decreased coordination, unsteady on feet especially when first standing up, states he "feels wobbly"   POSTURE: rounded shoulders, forward head, increased thoracic kyphosis, and flexed trunk   LOWER EXTREMITY ROM:   WFL   LOWER EXTREMITY MMT:    MMT  Right Eval Left Eval  Hip flexion 3+ 3+  Hip extension    Hip abduction    Hip adduction    Hip internal rotation 3+ 3+  Hip external rotation 3+ 3+  Knee flexion 3+ 3+  Knee extension 3+ 3+  Ankle dorsiflexion 4 4  Ankle plantarflexion 4 4  Ankle inversion 4 4  Ankle eversion 4 4  (Blank rows = not tested)   TRANSFERS: Assistive device utilized: Single point cane  Sit to stand: Modified independence Stand to sit: Modified independence Chair to chair: Modified independence   GAIT: Gait pattern: decreased arm swing- Right, decreased arm swing- Left, decreased step length- Right, decreased step length- Left, decreased stride length, ataxic, and trendelenburg Distance walked: 42ft Assistive device utilized: Single point cane Level of assistance: Complete Independence Comments: slowed gait, unstable, uses SPC 80% of the time  FUNCTIONAL TESTs:  5 times sit to stand: 18.34s Timed up and go (TUG): 16.72s w/SPC Merrilee Jansky Balance Scale: 39/56   TODAY'S TREATMENT:  Eval and POC   PATIENT EDUCATION: Education details: POC Person educated: Patient Education method: Explanation Education comprehension: verbalized understanding   HOME EXERCISE PROGRAM: Access Code: [email or web address] URL: [email or web address] Date: 03/31/2022 Prepared by: Andris Baumann  Exercises - Sit to Stand with Arms Crossed  - 1 x daily - 7 x weekly - 2 sets - 10 reps - Standing March with Counter Support  - 1 x daily - 7 x weekly - 2 sets - 10 reps - Standing Hip Abduction with Unilateral Counter Support  - 1 x daily - 7 x weekly - 2 sets - 10 reps - Heel Raises with Counter Support  - 1 x daily - 7 x weekly - 2 sets - 10 reps    GOALS: Goals reviewed with patient? Yes  SHORT TERM GOALS: Target date: 05/05/22  Patient will be independent with initial HEP. Goal status: INITIAL  2.  Patient will be educated on strategies to decrease risk of falls.  Goal status: INITIAL   LONG TERM GOALS:  Target date: 06/16/22  Patient will be independent with advanced/ongoing HEP to improve outcomes and carryover.  Goal status: INITIAL  2.  Patient will be able to ambulate 500' with LRAD with good safety to access community.  Baseline: using SPC w/SBA Goal status: INITIAL  3.  Patient will demonstrate improved functional LE strength as demonstrated by >= 4/5 in all LE muscle groups. Baseline: grossly 3+/5  Goal status: INITIAL  4.  Patient will demonstrate decreased fall risk by scoring < 12 sec on TUG and 5xSTS <15s. Baseline: 16.72s and 18.34s Goal status: INITIAL  5.  Patient will score 47 on Berg Balance test to demonstrate lower risk of falls. (MCID= 8 points) .  Baseline: 39 Goal status: INITIAL  6.  Patient will demonstrate gait speed of >/= 1.8 ft/sec (0.55 m/s) to be a safe limited community ambulator with decreased risk for recurrent falls.  Baseline: 1.35ft/sec Goal status: INITIAL   ASSESSMENT:  CLINICAL IMPRESSION: Patient is a 66 y.o. male who was seen today for physical therapy evaluation and treatment for balance and gait disturbances. Patient is currently battling lung cancer and has lost a lot of weight in two years. He presents  with generalized weakness in his legs and balance deficits. Patient is using a SPC to ambulate for stability. He will benefit from skilled PT intervention to address strength and balance issues to decrease fall risk and be more independent with daily tasks.    PERSONAL FACTORS 3+ comorbidities: cancer, coronary artery disease, HTN  are also affecting patient's functional outcome.   REHAB POTENTIAL: Good  CLINICAL DECISION MAKING: Stable/uncomplicated  EVALUATION COMPLEXITY: Low  PLAN: PT FREQUENCY: 2x/week  PT DURATION: 10 weeks  PLANNED INTERVENTIONS: Therapeutic exercises, Therapeutic activity, Neuromuscular re-education, Balance training, Gait training, Patient/Family education, Self Care, Joint mobilization, Stair training,  Cryotherapy, Moist heat, Taping, Ionotophoresis 4mg /ml Dexamethasone, and Manual therapy  PLAN FOR NEXT SESSION: light gym strengthening of LE, balance and gait training    Andris Baumann, PT 03/31/2022, 9:57 AM

## 2022-03-30 NOTE — Telephone Encounter (Signed)
Scheduled per 8/1 secure chat, pt has been called and confirmed

## 2022-03-30 NOTE — Progress Notes (Signed)
[place] [phone]   [phone]  OFFICE PROGRESS NOTE  [name], MD [place] [address]  DIAGNOSIS: Stage IV (pT1c, N1, M1b) large cell neuroendocrine carcinoma.  [name] presented with a right upper lobe lung nodule and hilar lymphadenopathy.  [name] was diagnosed in April 2022. [name] also had a small metastatic brain lesion.  [name] has local recurrence of his disease and Anthony right chest wall diagnosed in November 2022. Molecular studies by foundation 1 showed no actionable mutations   PDL1: 0%   PRIOR THERAPY: 1) Robotic assisted thoracic surgery with a right upper lobectomy which was performed on 12/24/2020.  2) SRS to a small metastatic brain lesion completed on February 13, 2021 under Anthony care of Dr. Lisbeth Renshaw.   3) Adjuvant chemotherapy with cisplatin 80 mg/m2 on day 1, etoposide 100 mg per metered squared on days 1, 2, and 3, IV every 3 weeks. First dose on 02/09/21.  Status post 4 cycles. 4) SRS to 4 new brain metastasis under Anthony care of Dr. Lisbeth Renshaw, Last dose on 08/26/21 5) Palliative radiation to right chest wall from 09/01/21-09/23/21 under Anthony care of Dr. Lisbeth Renshaw 6) SRS to Anthony new brain lesion along Anthony thalamus under Anthony care of Dr. Lisbeth Renshaw. Completed on 10/20/21 7) palliative systemic chemotherapy with carboplatin for an AUC of 5, Alimta 500 mg per metered square, Keytruda 200 mg IV every 3 weeks.  First dose on 09/29/21.  Carboplatin has been removed from cycle #2 due to pancytopenia.  Status post 8 cycles 8) SRS to Anthony new brain lesion along Anthony thalamus under Anthony care of Dr. Lisbeth Renshaw. Completed on 10/20/21   CURRENT THERAPY: None.  [name] is currently being evaluated for clinical trial at [facility]   INTERVAL HISTORY: [name] 66 y.o. male returns to Anthony clinic today for follow-up visit accompanied by his wife.  [name] is feeling fine today with no concerning complaints except for fatigue and  mild cough.  [name] denied having any chest pain or shortness of breath and no hemoptysis.  [name] has no nausea, vomiting, diarrhea or constipation.  [name] has no headache or visual changes.  [name] denied having any fever or chills.  [name] has no recent weight loss or night sweats.  [name] was admitted to Anthony hospital recently with mental status change and confusion and during his admission [name] had repeat CT scan of Anthony chest that showed evidence for disease progression.  [name] also has MRI of Anthony brain that showed stable brain metastasis.  [name] was seen recently by Dr. Durenda Hurt at [facility] for consideration of clinical trial and [name] is currently under evaluation for eligibility.  MEDICAL HISTORY: Past Medical History:  Diagnosis Date   Anemia    Anxiety    Arthritis    Basal cell carcinoma (BCC) of forehead    CAD (coronary artery disease)    a. 10/2015 ant STEMI >> LHC with 3 v CAD; oLAD tx with POBA >> emergent CABG. b. Multiple evals since that time, early graft failure of SVG-RCA by cath 03/2016. c. 2/19 PCI/DES x1 to pRCA, normal EF.   Carotid artery disease ([place])    a. 40-59% BICA 02/2018.   Depression    Dyspnea    Ectopic atrial tachycardia (HCC)    Esophageal reflux    eosinophil esophagitis   Family history of adverse reaction to anesthesia    "sister  has PONV" (06/21/2017)   Former tobacco use    Gout    Hepatitis C    "treated and cured" (06/21/2017)   High cholesterol    History of blood transfusion    History of kidney stones    Hypertension    Ischemic cardiomyopathy    a. EF 25-30% at intraop TEE 4/17  //  b. Limited Echo 5/17 - EF 45-50%, mild ant HK. c. EF 55-65% by cath 09/2017.   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[place])    Migraine    "3-4/yr" (06/21/2017)   Myocardial infarction ([place]) 10/2015   Palpitations    Pneumonia    Sinus bradycardia    a. HR dropping into 40s in 02/2016 -> BB reduced.   Stroke [facility]) 10/2016   "small one; sometimes my  memory/cognitive issues" (06/21/2017)   Symptomatic hypotension    a. 02/2016 ER visit -> meds reduced.   Syncope    Wears dentures    Wears glasses     ALLERGIES:  is allergic to prednisone, tetanus toxoids, wellbutrin [bupropion], and varenicline.  MEDICATIONS:  Current Outpatient Medications  Medication Sig Dispense Refill   acetaminophen (TYLENOL) 500 MG tablet Take 1,000 mg by mouth every 6 (six) hours as needed for mild pain, fever or headache.     benzonatate (TESSALON) 100 MG capsule Take 1 capsule (100 mg total) by mouth 3 (three) times daily as needed for cough. TAKE 1 CAPSULE(100 MG) BY MOUTH THREE TIMES DAILY AS NEEDED FOR COUGH Strength: 100 mg 30 capsule 2   bisacodyl (DULCOLAX) 5 MG EC tablet Take 5 mg by mouth daily as needed (constipation).     dexamethasone (DECADRON) 4 MG tablet Take 1 tablet (4 mg total) by mouth daily. 40 tablet 0   docusate sodium (STOOL SOFTENER) 100 MG capsule Take 100 mg by mouth 2 (two) times daily as needed (constipation).     DULoxetine (CYMBALTA) 30 MG capsule Take 1 capsule by mouth 2 times daily. 60 capsule 1   folic acid (FOLVITE) 1 MG tablet Take 1 tablet (1 mg total) by mouth daily. 30 tablet 4   HYDROmorphone (DILAUDID) 8 MG tablet Take 1 tablet by mouth every 6 hours as needed for severe pain. 60 tablet 0   lidocaine-prilocaine (EMLA) cream Apply to Anthony Port-A-Cath site 30-60-minute before treatment (Villa taking differently: Apply 1 Application topically once as needed (prior to port access - 30-60 minutes before treatment).) 30 g 0   LORazepam (ATIVAN) 1 MG tablet Take 1 tablet (1 mg total) by mouth every 8 (eight) hours as needed for anxiety. 60 tablet 0   metoprolol succinate (TOPROL-XL) 25 MG 24 hr tablet Take 0.5 tablets (12.5 mg total) by mouth daily. 45 tablet 3   morphine (MS CONTIN) 60 MG 12 hr tablet Take 1 tablet by mouth every 12  hours. 60 tablet 0   ondansetron (ZOFRAN) 8 MG tablet Take 1 tablet by mouth every 8 hours as  needed for nausea or vomiting. Starting 3 days after chemotherapy 90 tablet 2   tiZANidine (ZANAFLEX) 2 MG tablet Take 1 tablet (2 mg total) by mouth at bedtime as needed for muscle spasms. 30 tablet 2   XARELTO 20 MG TABS tablet Take 20 mg by mouth every evening.     No current facility-administered medications for this visit.    SURGICAL HISTORY:  Past Surgical History:  Procedure Laterality Date   25 GAUGE PARS PLANA VITRECTOMY WITH 20 GAUGE MVR PORT  12/31/2020  Procedure: 25 GAUGE PARS PLANA VITRECTOMY WITH 20 GAUGE MVR PORT;  Surgeon: [name], MD;  Location: [place];  Service: Thoracic;;   ANTERIOR CERVICAL DECOMP/DISCECTOMY FUSION N/A 10/17/2018   Procedure: Anterior Cervical Decompression Fusion - Cervical seven -Thoracic one;  Surgeon: Consuella Lose, MD;  Location: [place];  Service: Neurosurgery;  Laterality: N/A;   BASAL CELL CARCINOMA EXCISION     "forehead   BIOPSY  07/20/2019   Procedure: BIOPSY;  Surgeon: Carol Ada, MD;  Location: [facility];  Service: Endoscopy;;   BIOPSY OF MEDIASTINAL MASS Right 07/29/2021   Procedure: RIGHT CHEST WALL MASS BIOPSY;  Surgeon: [name], MD;  Location: [place];  Service: Thoracic;  Laterality: Right;   BRONCHIAL BIOPSY  12/24/2020   Procedure: BRONCHIAL BIOPSIES;  Surgeon: Garner Nash, DO;  Location: [facility];  Service: Pulmonary;;   BRONCHIAL BRUSHINGS  12/24/2020   Procedure: BRONCHIAL BRUSHINGS;  Surgeon: Garner Nash, DO;  Location: [place];  Service: Pulmonary;;   BRONCHIAL NEEDLE ASPIRATION BIOPSY  12/24/2020   Procedure: BRONCHIAL NEEDLE ASPIRATION BIOPSIES;  Surgeon: Garner Nash, DO;  Location: [place];  Service: Pulmonary;;   CARDIAC CATHETERIZATION N/A 11/28/2015   Procedure: Left Heart Cath and Coronary Angiography;  Surgeon: [name], MD;  Location: [place] [facility];  Service: Cardiovascular;  Laterality: N/A;   CARDIAC CATHETERIZATION N/A 11/28/2015   Procedure:  Coronary Balloon Angioplasty;  Surgeon: [name], MD;  Location: [place] [facility];  Service: Cardiovascular;  Laterality: N/A;  ostial LAD   CARDIAC CATHETERIZATION N/A 11/28/2015   Procedure: Coronary/Graft Angiography;  Surgeon: [name], MD;  Location: [place] [facility];  Service: Cardiovascular;  Laterality: N/A;  coronaries only    CARDIAC CATHETERIZATION N/A 04/21/2016   Procedure: Left Heart Cath and Coronary Angiography;  Surgeon: Wellington Hampshire, MD;  Location: [place] [facility];  Service: Cardiovascular;  Laterality: N/A;   CARDIAC CATHETERIZATION N/A 06/14/2016   Procedure: Left Heart Cath and Cors/Grafts Angiography;  Surgeon: Lorretta Harp, MD;  Location: [place] [facility];  Service: Cardiovascular;  Laterality: N/A;   CARDIAC CATHETERIZATION N/A 09/08/2016   Procedure: Left Heart Cath and Cors/Grafts Angiography;  Surgeon: Wellington Hampshire, MD;  Location: [place] [facility];  Service: Cardiovascular;  Laterality: N/A;   CARDIAC CATHETERIZATION     CORONARY ARTERY BYPASS GRAFT N/A 11/28/2015   Procedure: CORONARY ARTERY BYPASS GRAFTING (CABG) TIMES FIVE USING LEFT INTERNAL MAMMARY ARTERY AND RIGHT GREATER SAPHENOUS,VIEN HARVEATED BY ENDOVIEN, INTRAOPPRATIVE TEE;  Surgeon: Gaye Pollack, MD;  Location: [place];  Service: Open Heart Surgery;  Laterality: N/A;   CORONARY STENT INTERVENTION N/A 10/05/2017   Procedure: CORONARY STENT INTERVENTION;  Surgeon: [name], MD;  Location: [place] [facility];  Service: Cardiovascular;  Laterality: N/A;   ESOPHAGOGASTRODUODENOSCOPY (EGD) WITH PROPOFOL N/A 07/20/2019   Procedure: ESOPHAGOGASTRODUODENOSCOPY (EGD) WITH PROPOFOL;  Surgeon: Carol Ada, MD;  [facility];  Service: Endoscopy;  Laterality: N/A;   ESOPHAGOGASTRODUODENOSCOPY (EGD) WITH PROPOFOL N/A 01/30/2021   Procedure: ESOPHAGOGASTRODUODENOSCOPY (EGD) WITH PROPOFOL;  Surgeon: Carol Ada, MD;  [facility];  Service: Endoscopy;   Laterality: N/A;   FIDUCIAL MARKER PLACEMENT  12/24/2020   Procedure: FIDUCIAL DYE MARKER PLACEMENT;  Surgeon: Garner Nash, DO;  Location: [facility];  Service: Pulmonary;;   HUMERUS SURGERY Right 1969   "tumor inside bone; filled it w/bone chips"   INTERCOSTAL NERVE BLOCK Right 12/24/2020   Procedure: INTERCOSTAL NERVE  BLOCK;  Surgeon: [name], MD;  [facility];  Service: Thoracic;  Laterality: Right;   IR IMAGING GUIDED PORT INSERTION  02/06/2021   LEFT HEART CATH AND CORS/GRAFTS ANGIOGRAPHY N/A 03/11/2017   Procedure: Left Heart Cath and Cors/Grafts Angiography;  Surgeon: Leonie Man, MD;  Location: [place] [facility];  Service: Cardiovascular;  Laterality: N/A;   LEFT HEART CATH AND CORS/GRAFTS ANGIOGRAPHY N/A 10/05/2017   Procedure: LEFT HEART CATH AND CORS/GRAFTS ANGIOGRAPHY;  Surgeon: [name], MD;  Location: [place] [facility];  Service: Cardiovascular;  Laterality: N/A;   LEFT HEART CATH AND CORS/GRAFTS ANGIOGRAPHY N/A 04/11/2019   Procedure: LEFT HEART CATH AND CORS/GRAFTS ANGIOGRAPHY;  Surgeon: [name], MD;  Location: [place] [facility];  Service: Cardiovascular;  Laterality: N/A;   NODE DISSECTION Right 12/24/2020   Procedure: NODE DISSECTION;  Surgeon: [name], MD;  Location: [place];  Service: Thoracic;  Laterality: Right;   PERIPHERAL VASCULAR CATHETERIZATION N/A 06/14/2016   Procedure: Lower Extremity Angiography;  Surgeon: Lorretta Harp, MD;  Location: [place] [facility];  Service: Cardiovascular;  Laterality: N/A;   VIDEO BRONCHOSCOPY WITH ENDOBRONCHIAL NAVIGATION Right 12/24/2020   Procedure: VIDEO BRONCHOSCOPY WITH ENDOBRONCHIAL NAVIGATION;  Surgeon: Garner Nash, DO;  Location: [place];  Service: Pulmonary;  Laterality: Right;   VIDEO BRONCHOSCOPY WITH ENDOBRONCHIAL ULTRASOUND N/A 12/24/2020   Procedure: VIDEO BRONCHOSCOPY WITH ENDOBRONCHIAL ULTRASOUND;  Surgeon: Garner Nash, DO;  Location: [place];   Service: Pulmonary;  Laterality: N/A;   VIDEO BRONCHOSCOPY WITH INSERTION OF INTERBRONCHIAL VALVE (IBV) N/A 12/31/2020   Procedure: VIDEO BRONCHOSCOPY WITH INSERTION OF INTERBRONCHIAL VALVE (IBV).VALVE IN CARTRIDGE 81m,9mm. CHEST TUBE PLACEMENT.;  Surgeon: LLajuana Matte MD;  [facility];  Service: Thoracic;  Laterality: N/A;   VIDEO BRONCHOSCOPY WITH INSERTION OF INTERBRONCHIAL VALVE (IBV) N/A 07/29/2021   Procedure: VIDEO BRONCHOSCOPY WITH REMOVAL OF INTERBRONCHIAL VALVE (IBV);  Surgeon: LLajuana Matte MD;  [facility];  Service: Thoracic;  Laterality: N/A;    REVIEW OF SYSTEMS:  Constitutional: positive for fatigue and weight loss Eyes: negative Ears, nose, mouth, throat, and face: negative Respiratory: positive for cough Cardiovascular: negative Gastrointestinal: negative Genitourinary:negative Integument/breast: negative Hematologic/lymphatic: negative Musculoskeletal:negative Neurological: negative Behavioral/Psych: negative Endocrine: negative Allergic/Immunologic: negative   PHYSICAL EXAMINATION: General appearance: alert, cooperative, fatigued, and no distress Head: Normocephalic, without obvious abnormality, atraumatic Neck: no adenopathy, no JVD, supple, symmetrical, trachea midline, and thyroid not enlarged, symmetric, no tenderness/mass/nodules Lymph nodes: Cervical, supraclavicular, and axillary nodes normal. Resp: clear to auscultation bilaterally Back: symmetric, no curvature. ROM normal. No CVA tenderness. Cardio: regular rate and rhythm, S1, S2 normal, no murmur, click, rub or gallop GI: soft, non-tender; bowel sounds normal; no masses,  no organomegaly Extremities: extremities normal, atraumatic, no cyanosis or edema Neurologic: Alert and oriented X 3, normal strength and tone. Normal symmetric reflexes. Normal coordination and gait  ECOG PERFORMANCE STATUS: 1 - Symptomatic but completely ambulatory  Blood pressure 123/84, pulse (!) 111,  temperature 98.2 F (36.8 C), temperature source Oral, resp. rate 16, weight 147 lb 14.4 oz (67.1 kg), SpO2 100 %.  LABORATORY DATA: Lab Results  Component Value Date   WBC 8.7 03/30/2022   HGB 10.4 (L) 03/30/2022   HCT 31.5 (L) 03/30/2022   MCV 98.1 03/30/2022   PLT 307 03/30/2022      Chemistry      Component Value Date/Time   NA 135 03/22/2022 1605   K 4.2 03/22/2022 1605  CL 101 03/22/2022 1605   CO2 26 03/22/2022 1605   BUN 15 03/22/2022 1605   CREATININE 0.61 03/22/2022 1605   CREATININE 0.97 02/25/2022 0959   CREATININE 1.00 02/12/2016 1038      Component Value Date/Time   CALCIUM 9.8 03/22/2022 1605   ALKPHOS 97 03/22/2022 1605   AST 27 03/22/2022 1605   AST 27 02/25/2022 0959   ALT 31 03/22/2022 1605   ALT 25 02/25/2022 0959   BILITOT 0.5 03/22/2022 1605   BILITOT 0.5 02/25/2022 0959       RADIOGRAPHIC STUDIES: MR BRAIN W WO CONTRAST  Result Date: 03/16/2022 CLINICAL DATA:  Mental status change, unknown cause Hx of neuroendocrine carcinoma with mets to brain EXAM: MRI HEAD WITHOUT AND WITH CONTRAST TECHNIQUE: Multiplanar, multiecho pulse sequences of Anthony brain and surrounding structures were obtained without and with intravenous contrast. CONTRAST:  91mL GADAVIST GADOBUTROL 1 MMOL/ML IV SOLN COMPARISON:  May 2023 FINDINGS: Brain: Substantially decreased metastatic burden. Residual smaller left cerebellar lesion measuring 5 mm (series 16, image 32), smaller right frontal lesion measuring 5 mm (image 40), and stable 2 mm left thalamic lesion (image 85). Decreased edema associated with Anthony cerebellar and right frontal lesions. No new mass or abnormal enhancement. No acute infarction or hemorrhage. Few scattered foci of susceptibility reflecting chronic blood products including association with above lesions. Additional patchy foci of T2 hyperintensity in Anthony supratentorial and pontine white matter may reflect chronic microvascular ischemic changes. Prominence of Anthony  ventricles and sulci reflects stable parenchymal volume loss. No extra-axial collection. Vascular: Major vessel flow voids at Anthony skull base are preserved. Skull and upper cervical spine: Normal marrow signal is preserved. Sinuses/Orbits: Paranasal sinuses are aerated. Orbits are unremarkable. Other: Sella is unremarkable.  Mastoid air cells are clear. IMPRESSION: Substantially decreased metastatic burden with 3 residual stable or smaller lesions. No acute abnormality. Electronically Signed   By: Macy Mis M.D.   On: 03/16/2022 12:42   CT CHEST ABDOMEN PELVIS W CONTRAST  Result Date: 03/14/2022 CLINICAL DATA:  66 year old male with acute chest, abdominal and pelvic pain. History of non-small cell lung cancer. EXAM: CT CHEST, ABDOMEN, AND PELVIS WITH CONTRAST TECHNIQUE: Multidetector CT imaging of Anthony chest, abdomen and pelvis was performed following Anthony standard protocol during bolus administration of intravenous contrast. RADIATION DOSE REDUCTION: This exam was performed according to Anthony departmental dose-optimization program which includes automated exposure control, adjustment of the mA and/or kV according to Villa size and/or use of iterative reconstruction technique. CONTRAST:  158mL OMNIPAQUE IOHEXOL 300 MG/ML  SOLN COMPARISON:  12/17/2021 chest CT, 01/14/2022 abdomen and pelvis CT and other studies. FINDINGS: CT CHEST FINDINGS Cardiovascular: Heart size is normal. Heavy coronary artery atherosclerotic calcifications and evidence of CABG noted. Aortic atherosclerotic calcifications noted without evidence of thoracic aortic aneurysm. No pericardial effusion. Mediastinum/Nodes: Enlarging pretracheal, RIGHT paratracheal, RIGHT hilar and subcarinal lymph nodes are identified with 1.3 cm RIGHT paratracheal node (series 3: Image 12) previously measuring 1 cm, and 2 cm pretracheal node (3:21) previously measuring 1.2 cm. Visualized thyroid, trachea and esophagus are unremarkable. Lungs/Pleura: Innumerable  pulmonary nodules within both lungs have increased in size and number compatible with metastatic disease. Index LEFT UPPER lobe nodule now measures 1.3 x 1.6 cm (4:55) previously 0.8 x 1.2 cm. Increasing streaky and confluent opacities within Anthony RIGHT LOWER lobe noted. A small RIGHT basilar loculated effusion is again noted. There is no evidence of pneumothorax. Musculoskeletal: Irregularity of Anthony posterior RIGHT 11th and 12th ribs are again noted.  New irregularity/fracture of Anthony posterior RIGHT 9th rib is present. CT ABDOMEN PELVIS FINDINGS Hepatobiliary: A few tiny low-density hypodense lesions within Anthony liver are unchanged. No definite gallbladder abnormality identified. There is no evidence of intrahepatic or extrahepatic biliary dilatation. Pancreas: Unremarkable Spleen: Unremarkable Adrenals/Urinary Tract: Anthony kidneys are unremarkable except for a punctate nonobstructing RIGHT UPPER pole renal calculus and a stable LEFT renal cyst which needs no follow-up. Anthony adrenal glands are unremarkable. No significant bladder abnormality identified. Stomach/Bowel: Fluid within Anthony colon is noted and may represent a diarrheal state. No definite bowel wall thickening is present. There is no evidence of bowel obstruction or pneumoperitoneum. Vascular/Lymphatic: A 1.5 cm aortocaval node (3:76) previously measured 1.1 cm in short axis. Other shotty retroperitoneal lymph nodes are identified. Aortic atherosclerotic calcifications noted without evidence of aneurysm. Reproductive: Prostate is unremarkable. Other: No ascites, focal collection or pneumoperitoneum. Musculoskeletal: No acute or suspicious bony abnormalities are noted. IMPRESSION: 1. Increasing metastatic disease with increasing size and number of pulmonary nodules, and enlarging mediastinal, RIGHT hilar and aorto caval lymph nodes. 2. Increasing streaky and confluent opacities within Anthony RIGHT LOWER lobe which may represent atelectasis, tumor or infection.  Unchanged small loculated RIGHT basilar pleural effusion. 3. Unchanged irregularity of Anthony posterior RIGHT 11th and 12th ribs with new irregularity/fracture of Anthony posterior RIGHT 9th rib. Question metastatic disease or postprocedural/posttraumatic changes. 4. Fluid within Anthony colon which may represent a diarrheal state. 5. Aortic Atherosclerosis (ICD10-I70.0). Electronically Signed   By: Margarette Canada M.D.   On: 03/14/2022 16:34   CT Head Wo Contrast  Result Date: 03/14/2022 CLINICAL DATA:  Altered mental status. Confusion and delusions since noon. Known brain metastases associated with large-cell neuroendocrine carcinoma of Anthony right upper lobe. EXAM: CT HEAD WITHOUT CONTRAST TECHNIQUE: Contiguous axial images were obtained from Anthony base of Anthony skull through Anthony vertex without intravenous contrast. RADIATION DOSE REDUCTION: This exam was performed according to Anthony departmental dose-optimization program which includes automated exposure control, adjustment of the mA and/or kV according to Villa size and/or use of iterative reconstruction technique. COMPARISON:  MR head without and with contrast 01/26/2022 FINDINGS: Brain: Focal hypoattenuation in Anthony left cerebellum and high anterior right frontal lobe corresponds with edema about known lesions. Mild scattered white matter hypoattenuation scratched at mild diffuse white matter hypoattenuation is otherwise stable. No acute infarct or hemorrhage is present. No new areas of edema present to suggest significant progression of disease. Anthony ventricles are of normal size. No significant extraaxial fluid collection is present. Vascular: Atherosclerotic calcifications are present within Anthony cavernous internal carotid arteries. No hyperdense vessel is present. Skull: Calvarium is intact. No focal lytic or blastic lesions are present. No significant extracranial soft tissue lesion is present. Sinuses/Orbits: Anthony paranasal sinuses and mastoid air cells are clear. Anthony  globes and orbits are within normal limits. IMPRESSION: 1. No acute intracranial abnormality. 2. Stable appearance of edema about known lesions in Anthony left cerebellum and high anterior right frontal lobe. 3. No new areas of edema to suggest significant progression of disease. 4. Atherosclerosis. Electronically Signed   By: [place] More[place] M.D.   On: 03/14/2022 16:33   DG Chest Portable 1 View  Result Date: 03/14/2022 CLINICAL DATA:  Altered mental status. Fever. Stage IV lung cancer. EXAM: PORTABLE CHEST 1 VIEW COMPARISON:  01/14/2022 and prior studies FINDINGS: Unchanged cardiomediastinal silhouette with CABG changes again noted. RIGHT IJ central venous catheter with tip overlying Anthony cavoatrial junction again noted. Scattered nodules within both lungs are again identified  compatible with metastatic disease, better visualized with CT. No definite airspace disease identified. A small RIGHT pleural effusion with mild RIGHT basilar atelectasis noted. There is no evidence of pneumothorax. IMPRESSION: 1. Small RIGHT pleural effusion with mild RIGHT basilar atelectasis. 2. Bilateral pulmonary nodules/metastatic disease again noted. Electronically Signed   By: Margarette Canada M.D.   On: 03/14/2022 15:32    ASSESSMENT AND PLAN: This is 66 years old white male recently diagnosed with a stage IV (T1c, N1, M1 B) large cell neuroendocrine carcinoma presented with right upper lobe lung nodule in addition to right hilar adenopathy in April 2022 and [name] was found to have solitary brain metastasis in May 2022 [name] is status post right upper lobectomy with lymph node sampling under Anthony care of Dr. Kipp Brood. [name] underwent systemic chemotherapy with cisplatin 80 mg/M2 on day 1 and etoposide 100 Mg/M2 on days 1, 2 and 3 every 3 weeks.  [name] is status post 4 cycles.  [name] has a rough time tolerating his adjuvant systemic chemotherapy and it was discontinued after cycle #4. [name] is currently on observation but  continues to have pain in Anthony right side of Anthony chest.  [name] was seen by Dr. Kipp Brood and underwent resection of Anthony chest wall nodule and Anthony final pathology was consistent with local recurrence of his disease and Anthony right chest wall at Anthony site of one of Anthony chest wall tube. [name] was also found on recent MRI of Anthony brain to have 4 new small brain metastasis that were treated with SRS. [name] completed palliative radiotherapy to Anthony local disease recurrence in Anthony right side of Anthony chest wall likely from implants after his surgical resection. [name] is currently undergoing systemic chemotherapy with carboplatin for AUC of 5, Alimta 500 Mg/M2 and Keytruda 200 Mg IV every 3 weeks status post 8 cycles. Starting cycle #2 carboplatin was discontinued secondary to significant pancytopenia.   [name] has been tolerating this treatment well except for fatigue and occasional nausea. [name] had repeat CT scan of Anthony chest, abdomen and pelvis during his hospitalization on 03/14/2022 that showed increasing metastatic disease with increasing size and number of pulmonary nodules and enlarging mediastinal, right hilar and aortocaval lymph nodes. I had a lengthy discussion with [name] and his family at that time about his treatment options including referral to [facility] for consideration of clinical trial with Dr. Durenda Hurt versus second line treatment with docetaxel +/- Cyramza versus palliative care and hospice referral. [name] was interested in Anthony clinical trial and [name] was seen by Dr. Durenda Hurt yesterday and currently being evaluated for eligibility. I will wait for Anthony final decision regarding Anthony clinical trial before discussing Anthony next treatment option. For Anthony multiple brain metastasis, [name] completed whole brain irradiation. For pain management [name] is currently followed by Anthony palliative care team and on treatment with MS Contin, MS IR and gabapentin. I will see [name] on as needed basis at  this point if [name] is enrolled in Anthony clinical trial at [facility]. [name] was advised to call if [name] has any other concerning issues in Anthony interval.  [name] voices understanding of current disease status and treatment options and is in agreement with Anthony current care plan.  All questions were answered. [name] knows to call Anthony clinic with any problems, questions or concerns. We can certainly see [name] much sooner if necessary.  Anthony total time spent in Anthony appointment was 35 minutes.  Disclaimer:  This note was dictated with voice recognition software. Similar sounding words can inadvertently be transcribed and may not be corrected upon review.

## 2022-03-30 NOTE — Progress Notes (Signed)
[facility]  [phone]) [phone] [phone]) [phone]   Name: [name] Date: 03/30/2022 MRN: [id number]  DOB: [birth year]  Patient Care Team: [name], MD as PCP - General (Internal Medicine) [name], MD as PCP - Cardiology (Cardiology) Icard, Octavio Graves, DO as Consulting Physician (Pulmonary Disease) Maryanna Shape, NP as Nurse Practitioner (Oncology) Curt Bears, MD as Consulting Physician (Oncology) Pickenpack-Cousar, Carlena Sax, NP as Nurse Practitioner (Nurse Practitioner)    INTERVAL HISTORY: [name] is a 66 y.o. 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 He was admitted on 08/29/2021 and 09/15/21 from home with uncontrolled intractable chest wall pain. He has completed all of his radiation treatments. Now with plans to undergo immunotherapy as recommended by [facility].   SOCIAL HISTORY:     reports that he quit smoking about 6 years ago. His smoking use included cigarettes. He has a 33.00 pack-year smoking history. He has never used smokeless tobacco. He reports that he does not currently use alcohol. He reports that he does not use drugs.  ADVANCE DIRECTIVES:  None on file   CODE STATUS:   PAST MEDICAL HISTORY: Past Medical History:  Diagnosis Date   Anemia    Anxiety    Arthritis    Basal cell carcinoma (BCC) of forehead    CAD (coronary artery disease)    a. 10/2015 ant STEMI >> LHC with 3 v CAD; oLAD tx with POBA >> emergent CABG. b. Multiple evals since that time, early graft failure of SVG-RCA by cath 03/2016. c. 2/19  PCI/DES x1 to pRCA, normal EF.   Carotid artery disease ([place])    a. 40-59% BICA 02/2018.   Depression    Dyspnea    Ectopic atrial tachycardia (HCC)    Esophageal reflux    eosinophil esophagitis   Family history of adverse reaction to anesthesia    "sister has PONV" (06/21/2017)   Former tobacco use    Gout    Hepatitis C    "treated and cured" (06/21/2017)   High cholesterol    History of blood transfusion    History of kidney stones    Hypertension    Ischemic cardiomyopathy    a. EF 25-30% at intraop TEE 4/17  //  b. Limited Echo 5/17 - EF 45-50%, mild ant HK. c. EF 55-65% by cath 09/2017.   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[place])    Migraine    "3-4/yr" (06/21/2017)   Myocardial infarction ([place]) 10/2015   Palpitations    Pneumonia    Sinus bradycardia    a. HR dropping into 40s in 02/2016 -> BB reduced.   Stroke [facility]) 10/2016   "small one; sometimes my memory/cognitive issues" (06/21/2017)   Symptomatic hypotension    a. 02/2016 ER visit -> meds reduced.   Syncope    Wears dentures    Wears glasses     ALLERGIES:  is allergic to prednisone, tetanus toxoids, wellbutrin [bupropion], and varenicline.  MEDICATIONS:  Current Outpatient Medications  Medication Sig Dispense Refill   acetaminophen (TYLENOL) 500 MG tablet Take 1,000 mg by mouth every 6 (six) hours as needed for mild pain, fever or  headache.     benzonatate (TESSALON) 100 MG capsule Take 1 capsule (100 mg total) by mouth 3 (three) times daily as needed for cough. TAKE 1 CAPSULE(100 MG) BY MOUTH THREE TIMES DAILY AS NEEDED FOR COUGH Strength: 100 mg 30 capsule 2   bisacodyl (DULCOLAX) 5 MG EC tablet Take 5 mg by mouth daily as needed (constipation).     dexamethasone (DECADRON) 4 MG tablet Take 1 tablet (4 mg total) by mouth daily. 40 tablet 0   docusate sodium (STOOL SOFTENER) 100 MG capsule Take 100 mg by mouth 2 (two) times daily as needed (constipation).     DULoxetine (CYMBALTA) 30 MG capsule Take 1  capsule by mouth 2 times daily. 60 capsule 1   HYDROmorphone (DILAUDID) 8 MG tablet Take 1 tablet by mouth every 6 hours as needed for severe pain. 60 tablet 0   lidocaine-prilocaine (EMLA) cream Apply to the Port-A-Cath site 30-60-minute before treatment (Patient taking differently: Apply 1 Application topically once as needed (prior to port access - 30-60 minutes before treatment).) 30 g 0   LORazepam (ATIVAN) 1 MG tablet Take 1 tablet (1 mg total) by mouth every 8 (eight) hours as needed for anxiety. 60 tablet 0   metoprolol succinate (TOPROL-XL) 25 MG 24 hr tablet Take 0.5 tablets (12.5 mg total) by mouth daily. 45 tablet 3   morphine (MS CONTIN) 60 MG 12 hr tablet Take 1 tablet by mouth every 12  hours. 60 tablet 0   ondansetron (ZOFRAN) 8 MG tablet Take 1 tablet by mouth every 8 hours as needed for nausea or vomiting. Starting 3 days after chemotherapy 90 tablet 2   tiZANidine (ZANAFLEX) 2 MG tablet Take 1 tablet (2 mg total) by mouth at bedtime as needed for muscle spasms. 30 tablet 2   XARELTO 20 MG TABS tablet Take 20 mg by mouth every evening.     No current facility-administered medications for this visit.    VITAL SIGNS: There were no vitals taken for this visit. There were no vitals filed for this visit.   Estimated body mass index is 21.84 kg/m as calculated from the following:   Height as of 03/22/22: 5\' 9"  (1.753 m).   Weight as of an earlier encounter on 03/30/22: 147 lb 14.4 oz (67.1 kg).   PERFORMANCE STATUS (ECOG) : 1 - Symptomatic but completely ambulatory   Physical Exam General: NAD, thin, ambulatory with cane  Pulmonary: normal breathing pattern, diminished   Cardio: Tachycardic  Skin: Dry, flaky related to radiation Neurological: AAOx 4, appears comfortable  IMPRESSION:  [name] presents to the clinic today for symptom management follow-up. His wife is with him today. Seen by Dr. [name] today. Had consult with the team at [facility] on yesterday pending  acceptance into clinical trial which he is hopeful for. He shares options for additional treatments vs hospice level of care if not approved. I openly shared concerns with aggressive treatments outside of clinical trials due to previous tolerance and decreased functional/physical state.   Mrs. Batterton is preparing for spinal fusion surgery later this week.   Neoplasm related pain Pain remains controlled. Reports some increase in pain around right side of chest radiating to back. Is taking medications as prescribed. We will not make any changes at this time given disease progression and pending complex decisions.   His wife and daughter continue to assist with his pain management. Has not been requiring breakthrough medication around the clock.   We will closely monitor and adjust  as needed.  MS Contin 60 mg every 12 hours x 1 months (120 mg/day) June  MS Contin 45 mg every 12 hours x 1 month (90mg /day) July (on hold) MS Contin 30 mg every 12 hours x 1 month (60 mg/day)  MS Contin 15 mg every 12 hours x1 month (30 mg/day)    Hydromorphone 8 mg every 6 hours x 2 weeks (32 mg/day) 6/29 (on hold) Hydromorphone 4 mg every 4 hours x2 weeks (24 mg/day) Hydromorphone 4 mg every 6 hours x 3 weeks (16 mg) then we will further discuss   Constipation Reports bowel habits have been regular over the past 4 days. There was some previous challenges over the past month due to colitis.   Decreased appetite/weight loss Appetite somewhat improving. Shares he is surprised his weight has not gone up as he has been eating much more lately.   Weight today is 147lbs.  Previous weight 154.14lbs, down from 156 lb 6/1, 160 lbs 5/18, 168.6 lbs 4/24, down from 170 lbs on 4/20, has increased to 178 lbs 3/22, 169 lbs 2/21. Discussed importance of small frequent meals and increased protein intake. Goal is to gain better control and see some improvement over time.   4. Nausea/Vomiting   Is tolerating ondansetron.   Experiencing increased nausea with recent radiation.  Dr. Lisbeth Renshaw started him on Ativan which she reports is most effective.  5. Goals of Care  [name] is hopeful he will be accepted into clinical trial at [facility]. We discussed best case and worst case options. He understands if he does not qualify or chooses to forgo further therapies he would be appropriate for hospice. He openly speaks to although he does not wish to consider it may be time to consider his quality of life. Support provided.   Ongoing goals of care discussions.   PLAN: MS Contin as prescribed (see above) Dilaudid as needed with plans to continue weaning down over time (see above).  Cymbalta as prescribed Zofran and compazine as needed for ongoing nausea/vomiting.  Ativan as needed for nausea/anxiety  Miralax and Colace daily for bowel regimen. We will continue to closely monitor medications and evaluate best pain regimen.  Ongoing goals of care discussions. I will plan to see him back in 4-5 weeks in collaboration with his other oncology appointments.  He knows to call with any needs prior to next visit.   Patient expressed understanding and was in agreement with this plan. He also understands that He can call the clinic at any time with any questions, concerns, or complaints.       Any controlled substances utilized were prescribed in the context of palliative care. PDMP has been reviewed.    Time Total: 35 min   Visit consisted of counseling and education dealing with the complex and emotionally intense issues of symptom management and palliative care in the setting of serious and potentially life-threatening illness.Greater than 50%  of this time was spent counseling and coordinating care related to the above assessment and plan.  Alda Lea, AGPCNP-BC  Palliative Medicine Team/[facility]

## 2022-03-31 ENCOUNTER — Ambulatory Visit: Payer: Medicare Other | Attending: Family Medicine

## 2022-03-31 DIAGNOSIS — M5441 Lumbago with sciatica, right side: Secondary | ICD-10-CM | POA: Insufficient documentation

## 2022-03-31 DIAGNOSIS — R252 Cramp and spasm: Secondary | ICD-10-CM | POA: Diagnosis present

## 2022-03-31 DIAGNOSIS — M6281 Muscle weakness (generalized): Secondary | ICD-10-CM | POA: Diagnosis present

## 2022-03-31 DIAGNOSIS — R278 Other lack of coordination: Secondary | ICD-10-CM | POA: Insufficient documentation

## 2022-03-31 DIAGNOSIS — R2689 Other abnormalities of gait and mobility: Secondary | ICD-10-CM | POA: Insufficient documentation

## 2022-04-02 ENCOUNTER — Ambulatory Visit: Payer: Medicare Other

## 2022-04-02 DIAGNOSIS — M6281 Muscle weakness (generalized): Secondary | ICD-10-CM | POA: Diagnosis not present

## 2022-04-02 DIAGNOSIS — M5441 Lumbago with sciatica, right side: Secondary | ICD-10-CM

## 2022-04-02 DIAGNOSIS — R278 Other lack of coordination: Secondary | ICD-10-CM

## 2022-04-02 DIAGNOSIS — R252 Cramp and spasm: Secondary | ICD-10-CM

## 2022-04-02 DIAGNOSIS — R2689 Other abnormalities of gait and mobility: Secondary | ICD-10-CM

## 2022-04-02 NOTE — Therapy (Signed)
OUTPATIENT PHYSICAL THERAPY NEURO TREATMENT   Patient Name: [name] MRN: [id number] DOB:1956-02-23, 66 y.o., male Today's Date: 04/02/2022   PCP: Zack Seal REFERRING PROVIDER: Darliss Cheney   PT End of Session - 04/02/22 0929     Visit Number 2    Date for PT Re-Evaluation 06/16/22    Progress Note Due on Visit 10    PT Start Time 0930    PT Stop Time 1015    PT Time Calculation (min) 45 min    Equipment Utilized During Treatment Gait belt    Activity Tolerance Patient tolerated treatment well    Behavior During Therapy WFL for tasks assessed/performed              Past Medical History:  Diagnosis Date   Anemia    Anxiety    Arthritis    Basal cell carcinoma (BCC) of forehead    CAD (coronary artery disease)    a. 10/2015 ant STEMI >> LHC with 3 v CAD; oLAD tx with POBA >> emergent CABG. b. Multiple evals since that time, early graft failure of SVG-RCA by cath 03/2016. c. 2/19 PCI/DES x1 to pRCA, normal EF.   Carotid artery disease ([place])    a. 40-59% BICA 02/2018.   Depression    Dyspnea    Ectopic atrial tachycardia (HCC)    Esophageal reflux    eosinophil esophagitis   Family history of adverse reaction to anesthesia    "sister has PONV" (06/21/2017)   Former tobacco use    Gout    Hepatitis C    "treated and cured" (06/21/2017)   High cholesterol    History of blood transfusion    History of kidney stones    Hypertension    Ischemic cardiomyopathy    a. EF 25-30% at intraop TEE 4/17  //  b. Limited Echo 5/17 - EF 45-50%, mild ant HK. c. EF 55-65% by cath 09/2017.   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 HR dropping into 40s in 02/2016 -> BB reduced.   Stroke [facility]) 10/2016   "small one; sometimes my memory/cognitive issues" (06/21/2017)   Symptomatic hypotension    a. 02/2016 ER visit -> meds reduced.    Syncope    Wears dentures    Wears glasses    Past Surgical History:  Procedure Laterality Date   25 GAUGE PARS PLANA VITRECTOMY WITH 20 GAUGE MVR PORT  12/31/2020   Procedure: 25 GAUGE PARS PLANA VITRECTOMY WITH 20 GAUGE MVR PORT;  Surgeon: [name], MD;  Location: [place];  Service: Thoracic;;   ANTERIOR CERVICAL DECOMP/DISCECTOMY FUSION N/A 10/17/2018   Procedure: Anterior Cervical Decompression Fusion - Cervical seven -Thoracic one;  Surgeon: Consuella Lose, MD;  Location: [place];  Service: Neurosurgery;  Laterality: N/A;   BASAL CELL CARCINOMA EXCISION     "forehead   BIOPSY  07/20/2019   Procedure: BIOPSY;  Surgeon: Carol Ada, MD;  [facility];  Service: Endoscopy;;   BIOPSY OF MEDIASTINAL MASS Right 07/29/2021   Procedure: RIGHT CHEST WALL MASS BIOPSY;  Surgeon: [name], MD;  Location: [place];  Service: Thoracic;  Laterality: Right;   BRONCHIAL BIOPSY  12/24/2020   Procedure: BRONCHIAL BIOPSIES;  Surgeon: Garner Nash, DO;  Location: [place];  Service: Pulmonary;;   BRONCHIAL BRUSHINGS  12/24/2020  Procedure: BRONCHIAL BRUSHINGS;  Surgeon: Garner Nash, DO;  Location: [place] [facility];  Service: Pulmonary;;   BRONCHIAL NEEDLE ASPIRATION BIOPSY  12/24/2020   Procedure: BRONCHIAL NEEDLE ASPIRATION BIOPSIES;  Surgeon: Garner Nash, DO;  Location: [place];  Service: Pulmonary;;   CARDIAC CATHETERIZATION N/A 11/28/2015   Procedure: Left Heart Cath and Coronary Angiography;  Surgeon: [name], MD;  Location: [place] [facility];  Service: Cardiovascular;  Laterality: N/A;   CARDIAC CATHETERIZATION N/A 11/28/2015   Procedure: Coronary Balloon Angioplasty;  Surgeon: [name], MD;  Location: [place] [facility];  Service: Cardiovascular;  Laterality: N/A;  ostial LAD   CARDIAC CATHETERIZATION N/A 11/28/2015   Procedure: Coronary/Graft Angiography;  Surgeon: [name], MD;  Location: [place] [facility];  Service:  Cardiovascular;  Laterality: N/A;  coronaries only    CARDIAC CATHETERIZATION N/A 04/21/2016   Procedure: Left Heart Cath and Coronary Angiography;  Surgeon: Wellington Hampshire, MD;  Location: [place] [facility];  Service: Cardiovascular;  Laterality: N/A;   CARDIAC CATHETERIZATION N/A 06/14/2016   Procedure: Left Heart Cath and Cors/Grafts Angiography;  Surgeon: Lorretta Harp, MD;  Location: [place] [facility];  Service: Cardiovascular;  Laterality: N/A;   CARDIAC CATHETERIZATION N/A 09/08/2016   Procedure: Left Heart Cath and Cors/Grafts Angiography;  Surgeon: Wellington Hampshire, MD;  Location: [place] [facility];  Service: Cardiovascular;  Laterality: N/A;   CARDIAC CATHETERIZATION     CORONARY ARTERY BYPASS GRAFT N/A 11/28/2015   Procedure: CORONARY ARTERY BYPASS GRAFTING (CABG) TIMES FIVE USING LEFT INTERNAL MAMMARY ARTERY AND RIGHT GREATER SAPHENOUS,VIEN HARVEATED BY ENDOVIEN, INTRAOPPRATIVE TEE;  Surgeon: Gaye Pollack, MD;  Location: [place];  Service: Open Heart Surgery;  Laterality: N/A;   CORONARY STENT INTERVENTION N/A 10/05/2017   Procedure: CORONARY STENT INTERVENTION;  Surgeon: [name], MD;  Location: [place] [facility];  Service: Cardiovascular;  Laterality: N/A;   ESOPHAGOGASTRODUODENOSCOPY (EGD) WITH PROPOFOL N/A 07/20/2019   Procedure: ESOPHAGOGASTRODUODENOSCOPY (EGD) WITH PROPOFOL;  Surgeon: Carol Ada, MD;  [facility];  Service: Endoscopy;  Laterality: N/A;   ESOPHAGOGASTRODUODENOSCOPY (EGD) WITH PROPOFOL N/A 01/30/2021   Procedure: ESOPHAGOGASTRODUODENOSCOPY (EGD) WITH PROPOFOL;  Surgeon: Carol Ada, MD;  [facility];  Service: Endoscopy;  Laterality: N/A;   FIDUCIAL MARKER PLACEMENT  12/24/2020   Procedure: FIDUCIAL DYE MARKER PLACEMENT;  Surgeon: Garner Nash, DO;  Location: [place] [facility];  Service: Pulmonary;;   HUMERUS SURGERY Right 1969   "tumor inside bone; filled it w/bone chips"   INTERCOSTAL NERVE BLOCK Right 12/24/2020    Procedure: INTERCOSTAL NERVE BLOCK;  Surgeon: [name], MD;  Location: [place];  Service: Thoracic;  Laterality: Right;   IR IMAGING GUIDED PORT INSERTION  02/06/2021   LEFT HEART CATH AND CORS/GRAFTS ANGIOGRAPHY N/A 03/11/2017   Procedure: Left Heart Cath and Cors/Grafts Angiography;  Surgeon: Leonie Man, MD;  Location: [place] [facility];  Service: Cardiovascular;  Laterality: N/A;   LEFT HEART CATH AND CORS/GRAFTS ANGIOGRAPHY N/A 10/05/2017   Procedure: LEFT HEART CATH AND CORS/GRAFTS ANGIOGRAPHY;  Surgeon: [name], MD;  Location: [place] [facility];  Service: Cardiovascular;  Laterality: N/A;   LEFT HEART CATH AND CORS/GRAFTS ANGIOGRAPHY N/A 04/11/2019   Procedure: LEFT HEART CATH AND CORS/GRAFTS ANGIOGRAPHY;  Surgeon: [name], MD;  Location: [place] [facility];  Service: Cardiovascular;  Laterality: N/A;   NODE DISSECTION Right 12/24/2020   Procedure: NODE DISSECTION;  Surgeon: [name], MD;  [facility];  Service: Thoracic;  Laterality: Right;   PERIPHERAL VASCULAR CATHETERIZATION N/A 06/14/2016   Procedure: Lower Extremity Angiography;  Surgeon: Lorretta Harp, MD;  Location: [place] [facility];  Service: Cardiovascular;  Laterality: N/A;   VIDEO BRONCHOSCOPY WITH ENDOBRONCHIAL NAVIGATION Right 12/24/2020   Procedure: VIDEO BRONCHOSCOPY WITH ENDOBRONCHIAL NAVIGATION;  Surgeon: Garner Nash, DO;  Location: [place];  Service: Pulmonary;  Laterality: Right;   VIDEO BRONCHOSCOPY WITH ENDOBRONCHIAL ULTRASOUND N/A 12/24/2020   Procedure: VIDEO BRONCHOSCOPY WITH ENDOBRONCHIAL ULTRASOUND;  Surgeon: Garner Nash, DO;  Location: [place];  Service: Pulmonary;  Laterality: N/A;   VIDEO BRONCHOSCOPY WITH INSERTION OF INTERBRONCHIAL VALVE (IBV) N/A 12/31/2020   Procedure: VIDEO BRONCHOSCOPY WITH INSERTION OF INTERBRONCHIAL VALVE (IBV).VALVE IN CARTRIDGE 60mm,9mm. CHEST TUBE PLACEMENT.;  Surgeon: [name], MD;  [facility];   Service: Thoracic;  Laterality: N/A;   VIDEO BRONCHOSCOPY WITH INSERTION OF INTERBRONCHIAL VALVE (IBV) N/A 07/29/2021   Procedure: VIDEO BRONCHOSCOPY WITH REMOVAL OF INTERBRONCHIAL VALVE (IBV);  Surgeon: [name], MD;  Location: [place];  Service: Thoracic;  Laterality: N/A;   Patient Active Problem List   Diagnosis Date Noted   Acute metabolic encephalopathy 32/07/2481   AMS (altered mental status) 03/14/2022   Blurry vision, bilateral 10/08/2021   Leukopenia 10/08/2021   Wound of back 10/06/2021   Hyponatremia 10/03/2021   Cancer-related breakthrough pain 10/02/2021   Encounter for antineoplastic immunotherapy 09/29/2021   Cancer associated pain 09/15/2021   Constipation 09/15/2021   Right-sided chest wall pain 08/29/2021   Malnutrition of moderate degree 08/20/2021   Atrial tachycardia ([place]) 08/18/2021   Lung cancer ([place]) 07/29/2021   Hemoptysis 06/06/2021   History of pulmonary embolus (PE) 04/29/2021   Anemia 04/29/2021   Hypotension due to hypovolemia 04/28/2021   Hypomagnesemia 04/08/2021   Port-A-Cath in place 04/07/2021   Protein-calorie malnutrition, moderate ([place]) 03/25/2021   PNA (pneumonia) 03/22/2021   Community acquired pneumonia 03/22/2021   Sepsis ([place]) 03/22/2021   CKD (chronic kidney disease), stage III ([place]) 03/22/2021   Hypoxia 02/28/2021   Dyspnea 02/28/2021   Acute renal failure superimposed on stage 3a chronic kidney disease ([place]) 02/25/2021   AF (paroxysmal atrial fibrillation) ([place]) 02/25/2021   Pleural effusion 02/25/2021   Neutropenia, drug-induced ([place]) 02/24/2021   Positive blood cultures 02/17/2021   Positive blood culture 02/16/2021   Right flank pain 02/09/2021   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[place]) 01/30/2021   Encounter for antineoplastic chemotherapy 01/29/2021   Goals of care, counseling/discussion 01/14/2021   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[place]) 01/14/2021   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[place])  01/14/2021   Acute back pain with sciatica 01/12/2021   Allergic rhinitis 01/12/2021   Amnesia 01/12/2021   Kidney stone 01/12/2021   Sciatica 01/12/2021   Bipolar disorder ([place]) 01/12/2021   S/P lobectomy of lung 12/24/2020   Solitary lung nodule 11/28/2020   Chest pain 11/07/2019   Gastroesophageal reflux disease    Cervical radiculopathy 10/17/2018   Preoperative clearance 10/04/2018   Palpitations    Coronary artery disease involving coronary bypass graft of native heart with angina pectoris ([place])    Transient loss of consciousness 03/24/2018   Ectopic atrial tachycardia ([place]) 02/09/2018   Central chest pain 03/10/2017   Family hx-stroke 11/10/2016   Stroke-like episode ([place]) - R brain, s/p tPA 11/09/2016   Unstable angina ([place]) 09/07/2016   Claudication of both lower extremities ([place])    Pure hypercholesterolemia    Tobacco abuse disorder  CAD of autologous artery bypass graft without angina    Chest pain at rest 06/10/2016   Abnormal nuclear stress test - HIGH RISK 04/20/2016   Old MI (myocardial infarction)    Essential hypertension 02/26/2016   Ischemic cardiomyopathy 12/25/2015   Hyperlipidemia LDL goal <70 12/25/2015   Mild tobacco abuse in early remission 11/28/2015   Coronary artery disease involving native coronary artery of native heart with angina pectoris ([place]) 11/28/2015   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   Acute MI anterior wall first episode care [facility])    Precordial chest pain 03/07/2015   Gout attack 03/07/2015   Mixed bipolar I disorder ([place]) 03/07/2015   Fibromyalgia 07/09/2014   Gout 07/09/2014   Anxiety 07/09/2014   Depression 07/09/2014   Hepatitis C 23/55/7322   Eosinophilic esophagitis 02/54/2706    ONSET DATE: April 2021  REFERRING DIAG: R26.89  THERAPY DIAG:  Muscle weakness (generalized)  Balance problems  Other abnormalities of gait and mobility  Other lack of coordination  Acute right-sided low back pain with right-sided  sciatica  Cramp and spasm  Rationale for Evaluation and Treatment Rehabilitation  SUBJECTIVE:                                                                                                                                                                                              SUBJECTIVE STATEMENT: Everything is going good, got in with a trial at [facility]. Pain in R trunk and back. Came close to a fall but did not fall.    PERTINENT HISTORY: fibromyaglia, Afib, cancer, mixed bipolar disorder, CAD, HTN, unstable angina, stroke  PAIN:  Are you having pain? Yes: NPRS scale: 5/10 Pain location: R abdomen and radiates along trunk into back Pain description: constant, sharp, nagging Aggravating factors: "nothing really" Relieving factors: pain medication  PRECAUTIONS: Fall  WEIGHT BEARING RESTRICTIONS No  FALLS: Has patient fallen in last 6 months? Yes. Number of falls at least 10  LIVING ENVIRONMENT: Lives with: lives with their spouse Lives in: House/apartment Stairs: Yes: Internal: 13 steps; can reach both and External: 6 steps; bilateral but cannot reach both Has following equipment at home: Single point cane, Walker - 2 wheeled, and Grab bars  PLOF: Independent with basic ADLs and Independent with household mobility with device  PATIENT GOALS keep myself from getting no worse  OBJECTIVE:   DIAGNOSTIC FINDINGS: IMPRESSION: Substantially decreased metastatic burden with 3 residual stable or smaller lesions. No acute abnormality.  COGNITION: Overall cognitive status: Within functional limits for tasks assessed and History of cognitive impairments - at baseline   SENSATION: WFL  COORDINATION: Decreased coordination,  unsteady on feet especially when first standing up, states he "feels wobbly"   POSTURE: rounded shoulders, forward head, increased thoracic kyphosis, and flexed trunk   LOWER EXTREMITY ROM:   WFL   LOWER EXTREMITY MMT:    MMT Right Eval Left Eval   Hip flexion 3+ 3+  Hip extension    Hip abduction    Hip adduction    Hip internal rotation 3+ 3+  Hip external rotation 3+ 3+  Knee flexion 3+ 3+  Knee extension 3+ 3+  Ankle dorsiflexion 4 4  Ankle plantarflexion 4 4  Ankle inversion 4 4  Ankle eversion 4 4  (Blank rows = not tested)   TRANSFERS: Assistive device utilized: Single point cane  Sit to stand: Modified independence Stand to sit: Modified independence Chair to chair: Modified independence   GAIT: Gait pattern: decreased arm swing- Right, decreased arm swing- Left, decreased step length- Right, decreased step length- Left, decreased stride length, ataxic, and trendelenburg Distance walked: 44ft Assistive device utilized: Single point cane Level of assistance: Complete Independence Comments: slowed gait, unstable, uses SPC 80% of the time  FUNCTIONAL TESTs:  5 times sit to stand: 18.34s Timed up and go (TUG): 16.72s w/SPC Merrilee Jansky Balance Scale: 39/56   TODAY'S TREATMENT:  04/02/22 Nustep L3 x2mins  LAQ 2.5# 2x10 bilat HS curls greenTB 2x10 bilat STS 2x10 Marching w/2.5# holding cane   Step ups 6" 2x10  Step up on airex  Walking on beam   03/31/22 Eval and POC   PATIENT EDUCATION: Education details: POC Person educated: Patient Education method: Explanation Education comprehension: verbalized understanding   HOME EXERCISE PROGRAM: Access Code: [email or web address] URL: [email or web address] Date: 03/31/2022 Prepared by: Andris Baumann  Exercises - Sit to Stand with Arms Crossed  - 1 x daily - 7 x weekly - 2 sets - 10 reps - Standing March with Counter Support  - 1 x daily - 7 x weekly - 2 sets - 10 reps - Standing Hip Abduction with Unilateral Counter Support  - 1 x daily - 7 x weekly - 2 sets - 10 reps - Heel Raises with Counter Support  - 1 x daily - 7 x weekly - 2 sets - 10 reps    GOALS: Goals reviewed with patient? Yes  SHORT TERM GOALS: Target date: 05/05/22  Patient will be  independent with initial HEP. Goal status: INITIAL  2.  Patient will be educated on strategies to decrease risk of falls.  Goal status: INITIAL   LONG TERM GOALS: Target date: 06/16/22  Patient will be independent with advanced/ongoing HEP to improve outcomes and carryover.  Goal status: INITIAL  2.  Patient will be able to ambulate 500' with LRAD with good safety to access community.  Baseline: using SPC w/SBA Goal status: INITIAL  3.  Patient will demonstrate improved functional LE strength as demonstrated by >= 4/5 in all LE muscle groups. Baseline: grossly 3+/5  Goal status: INITIAL  4.  Patient will demonstrate decreased fall risk by scoring < 12 sec on TUG and 5xSTS <15s. Baseline: 16.72s and 18.34s Goal status: INITIAL  5.  Patient will score 47 on Berg Balance test to demonstrate lower risk of falls. (MCID= 8 points) .  Baseline: 39 Goal status: INITIAL  6.  Patient will demonstrate gait speed of >/= 1.8 ft/sec (0.55 m/s) to be a safe limited community ambulator with decreased risk for recurrent falls.  Baseline: 1.40ft/sec Goal status: INITIAL   ASSESSMENT:  CLINICAL IMPRESSION: Session focused on general  LE strengthening and balance to help decrease falls. We worked on Marine scientist with STS, marching, and step ups. minA w/ marching and using cane on one side due to instability. Most difficulty with balance activities on airex. CGAw/step ups, loses balance while stepping back off 2 times. Patient able to tolerate session well, reports some fatigue and feeling winded throughout from being out of shape due to sickness.   PERSONAL FACTORS 3+ comorbidities: cancer, coronary artery disease, HTN  are also affecting patient's functional outcome.   REHAB POTENTIAL: Good  CLINICAL DECISION MAKING: Stable/uncomplicated  EVALUATION COMPLEXITY: Low  PLAN: PT FREQUENCY: 2x/week  PT DURATION: 10 weeks  PLANNED INTERVENTIONS: Therapeutic exercises,  Therapeutic activity, Neuromuscular re-education, Balance training, Gait training, Patient/Family education, Self Care, Joint mobilization, Stair training, Cryotherapy, Moist heat, Taping, Ionotophoresis 4mg /ml Dexamethasone, and Manual therapy  PLAN FOR NEXT SESSION: light gym strengthening of LE, balance and gait training    Andris Baumann, PT 04/02/2022, 10:15 AM

## 2022-04-05 ENCOUNTER — Ambulatory Visit
Admission: RE | Admit: 2022-04-05 | Discharge: 2022-04-05 | Disposition: A | Discharge status: Home / Self Care | Payer: Medicare Other | Source: Ambulatory Visit | Attending: Radiation Oncology | Admitting: Radiation Oncology

## 2022-04-05 DIAGNOSIS — C7A1 Malignant poorly differentiated neuroendocrine tumors: Secondary | ICD-10-CM

## 2022-04-07 ENCOUNTER — Ambulatory Visit: Payer: Medicare Other | Admitting: Internal Medicine

## 2022-04-07 ENCOUNTER — Encounter: Payer: Medicare Other | Admitting: Dietician

## 2022-04-07 ENCOUNTER — Other Ambulatory Visit: Payer: Medicare Other

## 2022-04-07 ENCOUNTER — Ambulatory Visit: Payer: Medicare Other

## 2022-04-12 NOTE — Progress Notes (Signed)
  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   Intent: Palliative  Radiation Treatment Dates: 02/08/2022 through 02/22/2022 Site Technique Total Dose (Gy) Dose per Fx (Gy) Completed Fx Beam Energies  Brain: Brain IMRT 30/30 3 10/10 6X   Narrative: The patient tolerated radiation therapy relatively well. He did however develop some cognitive decline during therapy and was started on steroids by Dr. Lisbeth Renshaw but did not begin this until several days later. He developed an episode of loss of vision that resolved overnight and will be followed and he was encouraged to be evaluated in the ED if that returned. He's now receiving immunotherapy.    Impression/Plan: 1. 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 I spoke with the patient's wife and let her know the patient will be due for repeat MRI in about a month, and be followed in the brain oncology program, but he will also continue to follow up with Dr. [name] in medical oncology.      Carola Rhine, PAC

## 2022-04-13 ENCOUNTER — Other Ambulatory Visit: Payer: Self-pay | Admitting: Nurse Practitioner

## 2022-04-13 ENCOUNTER — Other Ambulatory Visit (HOSPITAL_COMMUNITY): Payer: Self-pay

## 2022-04-13 DIAGNOSIS — C7A1 Malignant poorly differentiated neuroendocrine tumors: Secondary | ICD-10-CM

## 2022-04-13 DIAGNOSIS — Z515 Encounter for palliative care: Secondary | ICD-10-CM

## 2022-04-13 DIAGNOSIS — G893 Neoplasm related pain (acute) (chronic): Secondary | ICD-10-CM

## 2022-04-13 MED ORDER — HYDROMORPHONE HCL 8 MG PO TABS
8.0000 mg | ORAL_TABLET | Freq: Four times a day (QID) | ORAL | 0 refills | Status: DC | PRN
  Filled 2022-04-13: qty 60, 15d supply, fill #0

## 2022-04-13 MED ORDER — MORPHINE SULFATE ER 60 MG PO TBCR: 60.0000 mg | EXTENDED_RELEASE_TABLET | Freq: Two times a day (BID) | ORAL | 0 refills | Status: DC

## 2022-04-14 ENCOUNTER — Ambulatory Visit: Payer: Medicare Other | Admitting: Physical Therapy

## 2022-04-14 ENCOUNTER — Telehealth: Payer: Self-pay | Admitting: Interventional Cardiology

## 2022-04-14 MED ORDER — XARELTO 20 MG PO TABS: 20.0000 mg | ORAL_TABLET | Freq: Every evening | ORAL | 1 refills | Status: DC

## 2022-04-14 NOTE — Telephone Encounter (Signed)
*  STAT* If patient is at the pharmacy, call can be transferred to refill team.   1. Which medications need to be refilled? (please list name of each medication and dose if known)   XARELTO 20 MG TABS tablet    2. Which pharmacy/location (including street and city if local pharmacy) is medication to be sent to?   [id number] - [place], [place] - [address] AT [address]. Do they need a 30 day or 90 day supply?  90 day

## 2022-04-14 NOTE — Telephone Encounter (Signed)
Pt c/o BP issue: STAT if pt c/o blurred vision, one-sided weakness or slurred speech  1. What are your last 5 BP readings? 96/69 101/71  2. Are you having any other symptoms (ex. Dizziness, headache, blurred vision, passed out)? No  3. What is your BP issue? Spouse states that pt's BP has been running low for the last 10 days with HR being 110 at times. She would like a callback regarding this. Please advise

## 2022-04-14 NOTE — Telephone Encounter (Signed)
Spoke w patient's wife. She has been getting low BP readings over the last 10 days or so and so not giving metoprolol.  She said she was instructed to hold it for SBP >120.  He is not very active due to his stage IV cancer and taking dilaudid and morphine.  She said he was given metoprolol to help slow down his HR which has been high.  He is receiving immunotherapy treatment.  Discussed how taking the pain medication can suppress BP.   He is drinking a lot of water.  I adv I would forward to Dr. Irish Lack to see if there is a different beta blocker he would recommend in place of metoprolol to use to help HR, or if he would want to change parameters.

## 2022-04-14 NOTE — Telephone Encounter (Signed)
Pt last saw Nicholes Rough, [place] on 03/09/22, last labs 04/08/22 Creat 0.9, age 66, weight 67.1kg, CrCl 75.59, based on CrCl pt is on appropriate dosage of Xarelto 20mg  QD for afib.  Will refill rx.

## 2022-04-15 ENCOUNTER — Other Ambulatory Visit (HOSPITAL_COMMUNITY): Payer: Self-pay

## 2022-04-15 ENCOUNTER — Telehealth: Payer: Self-pay

## 2022-04-15 NOTE — Telephone Encounter (Signed)
Pt called asking for a refill, pt wife picked up med yesterday 8/16, without pt knowing. This RN confirmed the correct med was pick up and pt had access to it, no further questions or concerns at this time.

## 2022-04-16 ENCOUNTER — Ambulatory Visit: Payer: Medicare Other | Admitting: Physical Therapy

## 2022-04-16 NOTE — Telephone Encounter (Signed)
Pt advised per Dr Irish Lack may hold Beta blocker (Metoprolol Succinate) for systolic BP less than 414.  Pt advised may call [phone] for any further questions or concerns.

## 2022-04-16 NOTE — Telephone Encounter (Signed)
Attempted phone call to pt's wife, DPR and left voicemail message to contact triage at [phone].

## 2022-04-16 NOTE — Telephone Encounter (Signed)
Pt returning a call to triage

## 2022-04-20 ENCOUNTER — Other Ambulatory Visit: Payer: Self-pay

## 2022-04-20 ENCOUNTER — Encounter (HOSPITAL_COMMUNITY): Payer: Self-pay

## 2022-04-20 ENCOUNTER — Emergency Department (HOSPITAL_COMMUNITY)
Admission: EM | Admit: 2022-04-20 | Discharge: 2022-04-20 | Disposition: A | Discharge status: Home / Self Care | Payer: Medicare Other | Attending: Emergency Medicine | Admitting: Emergency Medicine

## 2022-04-20 ENCOUNTER — Ambulatory Visit: Payer: Medicare Other | Admitting: Physical Therapy

## 2022-04-20 ENCOUNTER — Emergency Department (HOSPITAL_COMMUNITY): Payer: Medicare Other

## 2022-04-20 DIAGNOSIS — R42 Dizziness and giddiness: Secondary | ICD-10-CM | POA: Diagnosis not present

## 2022-04-20 DIAGNOSIS — Z79899 Other long term (current) drug therapy: Secondary | ICD-10-CM | POA: Diagnosis not present

## 2022-04-20 DIAGNOSIS — Z85118 Personal history of other malignant neoplasm of bronchus and lung: Secondary | ICD-10-CM | POA: Insufficient documentation

## 2022-04-20 DIAGNOSIS — Z20822 Contact with and (suspected) exposure to covid-19: Secondary | ICD-10-CM | POA: Insufficient documentation

## 2022-04-20 DIAGNOSIS — R531 Weakness: Secondary | ICD-10-CM | POA: Diagnosis present

## 2022-04-20 DIAGNOSIS — R410 Disorientation, unspecified: Secondary | ICD-10-CM | POA: Diagnosis not present

## 2022-04-20 DIAGNOSIS — Z7901 Long term (current) use of anticoagulants: Secondary | ICD-10-CM | POA: Diagnosis not present

## 2022-04-20 DIAGNOSIS — R112 Nausea with vomiting, unspecified: Secondary | ICD-10-CM | POA: Insufficient documentation

## 2022-04-20 LAB — CBC WITH DIFFERENTIAL/PLATELET
Abs Immature Granulocytes: 0.01 10*3/uL (ref 0.00–0.07)
Basophils Absolute: 0 10*3/uL (ref 0.0–0.1)
Basophils Relative: 1 %
Eosinophils Absolute: 0.1 10*3/uL (ref 0.0–0.5)
Eosinophils Relative: 1 %
HCT: 31.2 % — ABNORMAL LOW (ref 39.0–52.0)
Hemoglobin: 9.9 g/dL — ABNORMAL LOW (ref 13.0–17.0)
Immature Granulocytes: 0 %
Lymphocytes Relative: 11 %
Lymphs Abs: 0.7 10*3/uL (ref 0.7–4.0)
MCH: 30.8 pg (ref 26.0–34.0)
MCHC: 31.7 g/dL (ref 30.0–36.0)
MCV: 97.2 fL (ref 80.0–100.0)
Monocytes Absolute: 0.7 10*3/uL (ref 0.1–1.0)
Monocytes Relative: 13 %
Neutro Abs: 4.3 10*3/uL (ref 1.7–7.7)
Neutrophils Relative %: 74 %
Platelets: 241 10*3/uL (ref 150–400)
RBC: 3.21 MIL/uL — ABNORMAL LOW (ref 4.22–5.81)
RDW: 14.6 % (ref 11.5–15.5)
WBC: 5.7 10*3/uL (ref 4.0–10.5)
nRBC: 0 % (ref 0.0–0.2)

## 2022-04-20 LAB — COMPREHENSIVE METABOLIC PANEL
ALT: 13 U/L (ref 0–44)
AST: 23 U/L (ref 15–41)
Albumin: 3 g/dL — ABNORMAL LOW (ref 3.5–5.0)
Alkaline Phosphatase: 87 U/L (ref 38–126)
Anion gap: 8 (ref 5–15)
BUN: 13 mg/dL (ref 8–23)
CO2: 25 mmol/L (ref 22–32)
Calcium: 9.7 mg/dL (ref 8.9–10.3)
Chloride: 104 mmol/L (ref 98–111)
Creatinine, Ser: 0.87 mg/dL (ref 0.61–1.24)
GFR, Estimated: >60 mL/min (ref >60)
Glucose, Bld: 98 mg/dL (ref 70–99)
Potassium: 3.3 mmol/L — ABNORMAL LOW (ref 3.5–5.1)
Sodium: 137 mmol/L (ref 135–145)
Total Bilirubin: 1 mg/dL (ref 0.3–1.2)
Total Protein: 7.2 g/dL (ref 6.5–8.1)

## 2022-04-20 LAB — RESP PANEL BY RT-PCR (FLU A&B, COVID) ARPGX2
Influenza A by PCR: NEGATIVE
Influenza B by PCR: NEGATIVE
SARS Coronavirus 2 by RT PCR: NEGATIVE

## 2022-04-20 LAB — TROPONIN I (HIGH SENSITIVITY)
Troponin I (High Sensitivity): 4 ng/L (ref <18)
Troponin I (High Sensitivity): 4 ng/L (ref <18)

## 2022-04-20 LAB — LACTIC ACID, PLASMA: Lactic Acid, Venous: 1 mmol/L (ref 0.5–1.9)

## 2022-04-20 MED ORDER — POTASSIUM CHLORIDE 10 MEQ/100ML IV SOLN
10.0000 meq | Freq: Once | INTRAVENOUS | Status: AC
  Administered 2022-04-20: 10 meq via INTRAVENOUS
  Filled 2022-04-20: qty 100

## 2022-04-20 MED ORDER — HYDROMORPHONE HCL 2 MG/ML IJ SOLN
1.0000 mg | Freq: Once | INTRAMUSCULAR | Status: AC
  Administered 2022-04-20: 1 mg via INTRAVENOUS
  Filled 2022-04-20: qty 1

## 2022-04-20 MED ORDER — ONDANSETRON HCL 4 MG/2ML IJ SOLN
4.0000 mg | Freq: Once | INTRAMUSCULAR | Status: AC
  Administered 2022-04-20: 4 mg via INTRAVENOUS
  Filled 2022-04-20: qty 2

## 2022-04-20 MED ORDER — SODIUM CHLORIDE 0.9 % IV BOLUS
500.0000 mL | Freq: Once | INTRAVENOUS | Status: AC
  Administered 2022-04-20: 500 mL via INTRAVENOUS

## 2022-04-20 NOTE — Discharge Instructions (Signed)
Return for any problem.  ?

## 2022-04-20 NOTE — ED Provider Notes (Signed)
[facility] DEPT Provider Note   CSN: [id number] Arrival date & time: 04/20/22  1059     History  Chief Complaint  Patient presents with   Weakness   Emesis    [name] is a 66 y.o. male.  66 year old male with prior medical history as detailed below presents for evaluation.  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  Patient complains of increased weakness, dizziness, nausea, vomiting, confusion over the last 3 to 4 days.  Patient denies fever.  Patient is known to oncology here at [facility] -Dr. [name].  Patient is denying chest pain or shortness of breath.  The history is provided by the patient and medical records.  Weakness Severity:  Moderate Onset quality:  Gradual Duration:  3 days Timing:  Sporadic Progression:  Waxing and waning Chronicity:  New Associated symptoms: vomiting   Emesis      Home Medications Prior to Admission medications   Medication Sig Start Date End Date Taking? Authorizing Provider  acetaminophen (TYLENOL) 500 MG tablet Take 1,000 mg by mouth every 6 (six) hours as needed for mild pain, fever or headache.    [provider]  benzonatate (TESSALON) 100 MG capsule Take 1 capsule (100 mg total) by mouth 3 (three) times daily as needed for cough. TAKE 1 CAPSULE(100 MG) BY MOUTH THREE TIMES DAILY AS NEEDED FOR COUGH Strength: 100 mg 03/23/22   Heilingoetter, Cassandra L, PA-C  bisacodyl (DULCOLAX) 5 MG EC tablet Take 5 mg by mouth daily as needed (constipation).    [provider]  dexamethasone (DECADRON) 4 MG tablet Take 1 tablet (4 mg total) by mouth daily. 03/05/22   Kyung Rudd, MD  docusate sodium (STOOL SOFTENER) 100 MG capsule Take 100 mg by mouth 2 (two) times daily as needed (constipation).    [provider]  DULoxetine (CYMBALTA) 30 MG capsule Take 1 capsule by mouth 2 times daily. 03/29/22   Pickenpack-Cousar, Carlena Sax, NP   HYDROmorphone (DILAUDID) 8 MG tablet Take 1 tablet by mouth every 6 hours as needed for severe pain. 04/13/22   Pickenpack-Cousar, Carlena Sax, NP  lidocaine-prilocaine (EMLA) cream Apply to the Port-A-Cath site 30-60-minute before treatment Patient taking differently: Apply 1 Application topically once as needed (prior to port access - 30-60 minutes before treatment). 11/09/21   Curt Bears, MD  LORazepam (ATIVAN) 1 MG tablet Take 1 tablet (1 mg total) by mouth every 8 (eight) hours as needed for anxiety. 03/29/22   Pickenpack-Cousar, Carlena Sax, NP  metoprolol succinate (TOPROL-XL) 25 MG 24 hr tablet Take 0.5 tablets (12.5 mg total) by mouth daily. 03/09/22   Elgie Collard, PA-C  morphine (MS CONTIN) 60 MG 12 hr tablet Take 1 tablet by mouth every 12  hours. 04/19/22   Pickenpack-Cousar, Carlena Sax, NP  ondansetron (ZOFRAN) 8 MG tablet Take 1 tablet by mouth every 8 hours as needed for nausea or vomiting. Starting 3 days after chemotherapy 01/11/22   Pickenpack-Cousar, Carlena Sax, NP  tiZANidine (ZANAFLEX) 2 MG tablet Take 1 tablet (2 mg total) by mouth at bedtime as needed for muscle spasms. 02/02/22   Pickenpack-Cousar, Carlena Sax, NP  XARELTO 20 MG TABS tablet Take 1 tablet (20 mg total) by mouth every evening. 04/14/22   [name], MD      Allergies    Prednisone, Tetanus toxoids, Wellbutrin [bupropion], and Varenicline    Review of Systems   Review of Systems  Gastrointestinal:  Positive for vomiting.  Neurological:  Positive for weakness.  All other systems reviewed and are negative.   Physical Exam Updated Vital Signs BP 102/70 (BP Location: Right Arm)   Pulse (!) 106   Temp 98.5 F (36.9 C) (Oral)   Resp 18   SpO2 95%  Physical Exam Vitals and nursing note reviewed.  Constitutional:      General: He is not in acute distress.    Appearance: Normal appearance. He is well-developed.  HENT:     Head: Normocephalic and atraumatic.  Eyes:     Conjunctiva/sclera: Conjunctivae  normal.     Pupils: Pupils are equal, round, and reactive to light.  Cardiovascular:     Rate and Rhythm: Normal rate and regular rhythm.     Heart sounds: Normal heart sounds.  Pulmonary:     Effort: Pulmonary effort is normal. No respiratory distress.     Breath sounds: Normal breath sounds.  Abdominal:     General: There is no distension.     Palpations: Abdomen is soft.     Tenderness: There is no abdominal tenderness.  Musculoskeletal:        General: No deformity. Normal range of motion.     Cervical back: Normal range of motion and neck supple.  Skin:    General: Skin is warm and dry.  Neurological:     General: No focal deficit present.     Mental Status: He is alert and oriented to person, place, and time.     ED Results / Procedures / Treatments   Labs (all labs ordered are listed, but only abnormal results are displayed) Labs Reviewed  CBC WITH DIFFERENTIAL/PLATELET - Abnormal; Notable for the following components:      Result Value   RBC 3.21 (*)    Hemoglobin 9.9 (*)    HCT 31.2 (*)    All other components within normal limits  COMPREHENSIVE METABOLIC PANEL - Abnormal; Notable for the following components:   Potassium 3.3 (*)    Albumin 3.0 (*)    All other components within normal limits  RESP PANEL BY RT-PCR (FLU A&B, COVID) ARPGX2  CULTURE, BLOOD (ROUTINE X 2)  CULTURE, BLOOD (ROUTINE X 2)  LACTIC ACID, PLASMA  TROPONIN I (HIGH SENSITIVITY)  TROPONIN I (HIGH SENSITIVITY)    EKG EKG Interpretation  Date/Time:  Tuesday April 20 2022 11:24:40 EDT Ventricular Rate:  108 PR Interval:  143 QRS Duration: 83 QT Interval:  345 QTC Calculation: 463 R Axis:   65 Text Interpretation: Sinus tachycardia Low voltage, extremity and precordial leads Confirmed by Dene Gentry [phone]) on 04/20/2022 12:16:58 PM  Radiology DG Chest Port 1 View  Result Date: 04/20/2022 CLINICAL DATA:  Weakness.  History of metastatic lung cancer. EXAM: PORTABLE CHEST 1 VIEW  COMPARISON:  03/14/2022 CT. FINDINGS: The cardiomediastinal silhouette is unchanged. CABG changes and RIGHT IJ Port-A-Cath with tip overlying the SUPERIOR cavoatrial junction again noted. Pulmonary vascular congestion and small RIGHT pleural effusion are noted. RIGHT basilar opacity/atelectasis appear slightly increased. Multiple pulmonary nodules are again identified. There is no evidence of pneumothorax. IMPRESSION: Slightly increased RIGHT basilar opacity/atelectasis, small RIGHT pleural effusion and pulmonary vascular congestion. Multiple pulmonary nodules/metastases again identified Electronically Signed   By: Margarette Canada M.D.   On: 04/20/2022 12:12    Procedures Procedures    Medications Ordered in ED Medications  sodium chloride 0.9 % bolus 500 mL (has no administration in time range)    ED Course/ Medical Decision Making/ A&P  Medical Decision Making Amount and/or Complexity of Data Reviewed Labs: ordered. Radiology: ordered.  Risk Prescription drug management.    Medical Screen Complete  This patient presented to the ED with complaint of weakness, fatigue, nausea.  This complaint involves an extensive number of treatment options. The initial differential diagnosis includes, but is not limited to, metabolic abnormality, infection, etc.   This presentation is: Acute, Chronic, Self-Limited, Previously Undiagnosed, Uncertain Prognosis, Complicated, Systemic Symptoms, and Threat to Life/Bodily Function   Patient is presenting with complaint of weakness, fatigue, nausea.  Symptoms began 2 to 3 days ago.  Patient reports recent trial infusion at [facility] for treatment of metastatic lung cancer.  Patient screening labs are reassuring.  Patient with mild hypokalemia which is treated.  After treatment of hyperkalemia, infusion of IV fluids, and additional pain medication the patient feels significantly improved.  Patient offered admission.  He declines.   He prefers to go home.  He reports that he feels significantly improved and desires discharge.  He does understand need for close outpatient follow-up.  Strict return precautions given and understood.      Additional history obtained:  Additional history obtained from [facility] External records from outside sources obtained and reviewed including prior ED visits and prior Inpatient records.    Lab Tests:  I ordered and personally interpreted labs.  The pertinent results include: CBC, CMP, COVID, flu, lactic acid, troponin   Imaging Studies ordered:  I ordered imaging studies including CT head, chest x-ray I independently visualized and interpreted obtained imaging which showed NAD I agree with the radiologist interpretation.   Cardiac Monitoring:  The patient was maintained on a cardiac monitor.  I personally viewed and interpreted the cardiac monitor which showed an underlying rhythm of: Sinus tach, NSR   Medicines ordered:  I ordered medication including Dilaudid, IV fluids, potassium, Zofran for pain, dehydration, nausea, hypokalemia Reevaluation of the patient after these medicines showed that the patient: improved    Problem List / ED Course:  Nausea, pain, hypokalemia   Reevaluation:  After the interventions noted above, I reevaluated the patient and found that they have: improved  Disposition:  After consideration of the diagnostic results and the patients response to treatment, I feel that the patent would benefit from close outpatient follow-up.          Final Clinical Impression(s) / ED Diagnoses Final diagnoses:  Weakness    Rx / DC Orders ED Discharge Orders     None         Valarie Merino, MD 04/20/22 1609

## 2022-04-20 NOTE — ED Triage Notes (Signed)
Pt BIB EMS from home. Pt had immunotherapy shot on 8/10. On 8/13 pt began having weakness, dizziness, nausea, vomiting, and diarrhea. Hx of lung cancer.   BP 110/70 HR 110 96% RA

## 2022-04-21 ENCOUNTER — Telehealth: Payer: Self-pay | Admitting: Physician Assistant

## 2022-04-21 NOTE — Telephone Encounter (Signed)
Patient and wife called after hours as they confused about metoprolol instruction.  Reviewed prior telephone note.  Advised to continue Toprol XL half a tablet at 12.5 mg daily.  Only hold if systolic blood pressure below 100.  He may recheck blood pressure in a one or two hours, and it comes back up he can take his medication.

## 2022-04-23 ENCOUNTER — Ambulatory Visit: Payer: Medicare Other | Admitting: Physical Therapy

## 2022-04-23 ENCOUNTER — Telehealth: Payer: Self-pay

## 2022-04-23 NOTE — Telephone Encounter (Signed)
     Patient  visit on 8/22  at [facility] was for Weakness  Have you been able to follow up with your primary care physician? no  The patient was or was not able to obtain any needed medicine or equipment.na  Are there diet recommendations that you are having difficulty following?na  Patient expresses understanding of discharge instructions and education provided has no other needs at this time. yes     [facility]  [phone] [address], [place], [place] [address] Phone: [phone] Email: [email or web address]

## 2022-04-25 LAB — CULTURE, BLOOD (ROUTINE X 2)
Culture: NO GROWTH
Culture: NO GROWTH
Special Requests: ADEQUATE
Special Requests: ADEQUATE

## 2022-04-26 ENCOUNTER — Other Ambulatory Visit (HOSPITAL_COMMUNITY): Payer: Self-pay

## 2022-04-26 ENCOUNTER — Telehealth: Payer: Self-pay

## 2022-04-26 NOTE — Telephone Encounter (Signed)
Error

## 2022-04-27 ENCOUNTER — Ambulatory Visit: Payer: Medicare Other | Admitting: Physical Therapy

## 2022-04-27 ENCOUNTER — Other Ambulatory Visit (HOSPITAL_COMMUNITY): Payer: Self-pay

## 2022-04-29 ENCOUNTER — Ambulatory Visit: Payer: Medicare Other | Admitting: Internal Medicine

## 2022-04-29 ENCOUNTER — Encounter: Payer: Self-pay | Admitting: Nurse Practitioner

## 2022-04-29 ENCOUNTER — Other Ambulatory Visit: Payer: Medicare Other

## 2022-04-29 ENCOUNTER — Other Ambulatory Visit: Payer: Self-pay | Admitting: *Deleted

## 2022-04-29 ENCOUNTER — Ambulatory Visit: Payer: Medicare Other | Admitting: Physician Assistant

## 2022-04-29 ENCOUNTER — Ambulatory Visit: Payer: Medicare Other

## 2022-04-29 NOTE — Patient Outreach (Signed)
  Care Coordination   Initial Visit Note   04/29/2022 Name: [name] MRN: [id number] DOB: October 08, 1955  [name] is a 66 y.o. year old male who sees Anthony Poisson, MD for primary care. I spoke with  [name] by phone today.  What matters to the patients health and wellness today?  N/a    Goals Addressed   None     SDOH assessments and interventions completed:  No     Care Coordination Interventions Activated:  No  Care Coordination Interventions:  No, not indicated   Follow up plan:  pt received treatment @ [facility] (oncology) and RN was unable to inquire on a call back date.    Encounter Outcome:  Pt. Request to Call Back   Anthony Mina, RN Care Management Coordinator [place] Office [phone]

## 2022-04-29 NOTE — Progress Notes (Signed)
Late Entry:   Received a call from Coron's daughter (04/26/2022) expressing concerns regarding Mr. Tipler health decline. Daughter states she and her sister do not want to be responsible for patient being dismissed from his current clinical trial knowing this is the last little bit of hope he has however also with great concern that he is not improving. She shares he has not showered in several days, had several falls over the past week, seems extremely weak, and is now requiring some assistance with ambulating. Family feels patient "tries" to pull himself together when he has medical appointments giving false sense of how he is really doing. Their mother is now hospitalized after complications from her back surgery. The daughters are requesting home health to assist with ADLs as they are not comfortable bathing their father.   Daughter reports she was concerned when her father advised he was starting the trial knowing he would not do well however tried to support his decisions.   I discussed with her to consider hiring in home aid to assist with ADLs. She verbalized understanding expressing they would look into this and could afford it for the time being. I also encouraged daughters to reach out to the team at [facility] and discuss concerns. He does have an appointment later this week and one of them will be going with him. They do not want patient upset with them and him feeling like "they have given up on him". Daughter emotional expressing his quality of life "just isn't good!"  I did empathetically discuss options for hospice care if decisions are made for no further treatment. Education provided on what this would look like in the home. Daughter verbalized understanding and appreciation of time and suggestions.   She has requested not to discuss our conversation with patient respectfully. We have a scheduled phone follow-up on 9/5. She would like this to be an in-person visit if possible  although she knows he will not agree. I advised we will wait to see what outcome is with his follow-up at [facility] this week and I would then contact him and request an office visit versus virtual.   All questions answered and support provided.    Visit consisted of counseling and education dealing with the complex and emotionally intense issues of symptom management and palliative care in the setting of serious and potentially life-threatening illness.Greater than 50%  of this time was spent counseling and coordinating care related to the above assessment and plan.  Alda Lea, AGPCNP-BC  Palliative Medicine Team/[place]

## 2022-04-30 ENCOUNTER — Ambulatory Visit: Payer: Medicare Other | Admitting: Physical Therapy

## 2022-05-03 ENCOUNTER — Inpatient Hospital Stay (HOSPITAL_BASED_OUTPATIENT_CLINIC_OR_DEPARTMENT_OTHER)
Admission: EM | Admit: 2022-05-03 | Discharge: 2022-05-13 | DRG: 071 | Disposition: A | Discharge status: Rehabilitation Facility | Payer: Medicare Other | Attending: Internal Medicine | Admitting: Internal Medicine

## 2022-05-03 ENCOUNTER — Encounter (HOSPITAL_BASED_OUTPATIENT_CLINIC_OR_DEPARTMENT_OTHER): Payer: Self-pay | Admitting: Emergency Medicine

## 2022-05-03 ENCOUNTER — Other Ambulatory Visit: Payer: Self-pay

## 2022-05-03 DIAGNOSIS — M109 Gout, unspecified: Secondary | ICD-10-CM | POA: Diagnosis present

## 2022-05-03 DIAGNOSIS — Z8673 Personal history of transient ischemic attack (TIA), and cerebral infarction without residual deficits: Secondary | ICD-10-CM

## 2022-05-03 DIAGNOSIS — Z981 Arthrodesis status: Secondary | ICD-10-CM

## 2022-05-03 DIAGNOSIS — I951 Orthostatic hypotension: Secondary | ICD-10-CM | POA: Diagnosis present

## 2022-05-03 DIAGNOSIS — Z8249 Family history of ischemic heart disease and other diseases of the circulatory system: Secondary | ICD-10-CM

## 2022-05-03 DIAGNOSIS — D649 Anemia, unspecified: Secondary | ICD-10-CM

## 2022-05-03 DIAGNOSIS — Z923 Personal history of irradiation: Secondary | ICD-10-CM

## 2022-05-03 DIAGNOSIS — F32A Depression, unspecified: Secondary | ICD-10-CM | POA: Diagnosis present

## 2022-05-03 DIAGNOSIS — R251 Tremor, unspecified: Secondary | ICD-10-CM

## 2022-05-03 DIAGNOSIS — I129 Hypertensive chronic kidney disease with stage 1 through stage 4 chronic kidney disease, or unspecified chronic kidney disease: Secondary | ICD-10-CM | POA: Diagnosis present

## 2022-05-03 DIAGNOSIS — Z888 Allergy status to other drugs, medicaments and biological substances status: Secondary | ICD-10-CM

## 2022-05-03 DIAGNOSIS — Z823 Family history of stroke: Secondary | ICD-10-CM

## 2022-05-03 DIAGNOSIS — E871 Hypo-osmolality and hyponatremia: Secondary | ICD-10-CM | POA: Diagnosis present

## 2022-05-03 DIAGNOSIS — Z006 Encounter for examination for normal comparison and control in clinical research program: Secondary | ICD-10-CM

## 2022-05-03 DIAGNOSIS — G893 Neoplasm related pain (acute) (chronic): Secondary | ICD-10-CM

## 2022-05-03 DIAGNOSIS — I25709 Atherosclerosis of coronary artery bypass graft(s), unspecified, with unspecified angina pectoris: Secondary | ICD-10-CM | POA: Diagnosis present

## 2022-05-03 DIAGNOSIS — E7800 Pure hypercholesterolemia, unspecified: Secondary | ICD-10-CM | POA: Diagnosis present

## 2022-05-03 DIAGNOSIS — Z79891 Long term (current) use of opiate analgesic: Secondary | ICD-10-CM

## 2022-05-03 DIAGNOSIS — D638 Anemia in other chronic diseases classified elsewhere: Secondary | ICD-10-CM | POA: Diagnosis present

## 2022-05-03 DIAGNOSIS — C7A1 Malignant poorly differentiated neuroendocrine tumors: Secondary | ICD-10-CM | POA: Diagnosis present

## 2022-05-03 DIAGNOSIS — Z66 Do not resuscitate: Secondary | ICD-10-CM | POA: Diagnosis present

## 2022-05-03 DIAGNOSIS — R531 Weakness: Secondary | ICD-10-CM

## 2022-05-03 DIAGNOSIS — C7931 Secondary malignant neoplasm of brain: Secondary | ICD-10-CM | POA: Diagnosis present

## 2022-05-03 DIAGNOSIS — Z9861 Coronary angioplasty status: Secondary | ICD-10-CM

## 2022-05-03 DIAGNOSIS — R296 Repeated falls: Secondary | ICD-10-CM | POA: Diagnosis present

## 2022-05-03 DIAGNOSIS — Z87891 Personal history of nicotine dependence: Secondary | ICD-10-CM

## 2022-05-03 DIAGNOSIS — C7951 Secondary malignant neoplasm of bone: Secondary | ICD-10-CM | POA: Diagnosis present

## 2022-05-03 DIAGNOSIS — G9341 Metabolic encephalopathy: Principal | ICD-10-CM | POA: Diagnosis present

## 2022-05-03 DIAGNOSIS — I255 Ischemic cardiomyopathy: Secondary | ICD-10-CM | POA: Diagnosis present

## 2022-05-03 DIAGNOSIS — Z7901 Long term (current) use of anticoagulants: Secondary | ICD-10-CM

## 2022-05-03 DIAGNOSIS — E876 Hypokalemia: Secondary | ICD-10-CM | POA: Diagnosis present

## 2022-05-03 DIAGNOSIS — W19XXXA Unspecified fall, initial encounter: Secondary | ICD-10-CM | POA: Diagnosis present

## 2022-05-03 DIAGNOSIS — Z515 Encounter for palliative care: Secondary | ICD-10-CM

## 2022-05-03 DIAGNOSIS — N183 Chronic kidney disease, stage 3 unspecified: Secondary | ICD-10-CM | POA: Diagnosis present

## 2022-05-03 DIAGNOSIS — K219 Gastro-esophageal reflux disease without esophagitis: Secondary | ICD-10-CM | POA: Diagnosis present

## 2022-05-03 DIAGNOSIS — I252 Old myocardial infarction: Secondary | ICD-10-CM

## 2022-05-03 DIAGNOSIS — I480 Paroxysmal atrial fibrillation: Secondary | ICD-10-CM | POA: Diagnosis present

## 2022-05-03 DIAGNOSIS — Z79899 Other long term (current) drug therapy: Secondary | ICD-10-CM

## 2022-05-03 DIAGNOSIS — Z86711 Personal history of pulmonary embolism: Secondary | ICD-10-CM

## 2022-05-03 DIAGNOSIS — I2510 Atherosclerotic heart disease of native coronary artery without angina pectoris: Secondary | ICD-10-CM | POA: Diagnosis present

## 2022-05-03 DIAGNOSIS — Z85828 Personal history of other malignant neoplasm of skin: Secondary | ICD-10-CM

## 2022-05-03 DIAGNOSIS — Z951 Presence of aortocoronary bypass graft: Secondary | ICD-10-CM

## 2022-05-03 LAB — COMPREHENSIVE METABOLIC PANEL
ALT: 13 U/L (ref 0–44)
AST: 21 U/L (ref 15–41)
Albumin: 3 g/dL — ABNORMAL LOW (ref 3.5–5.0)
Alkaline Phosphatase: 90 U/L (ref 38–126)
Anion gap: 8 (ref 5–15)
BUN: 16 mg/dL (ref 8–23)
CO2: 24 mmol/L (ref 22–32)
Calcium: 9 mg/dL (ref 8.9–10.3)
Chloride: 99 mmol/L (ref 98–111)
Creatinine, Ser: 0.91 mg/dL (ref 0.61–1.24)
GFR, Estimated: >60 mL/min (ref >60)
Glucose, Bld: 95 mg/dL (ref 70–99)
Potassium: 3.7 mmol/L (ref 3.5–5.1)
Sodium: 131 mmol/L — ABNORMAL LOW (ref 135–145)
Total Bilirubin: 0.6 mg/dL (ref 0.3–1.2)
Total Protein: 7 g/dL (ref 6.5–8.1)

## 2022-05-03 LAB — CBC WITH DIFFERENTIAL/PLATELET
Abs Immature Granulocytes: 0.03 10*3/uL (ref 0.00–0.07)
Basophils Absolute: 0 10*3/uL (ref 0.0–0.1)
Basophils Relative: 1 %
Eosinophils Absolute: 0.2 10*3/uL (ref 0.0–0.5)
Eosinophils Relative: 3 %
HCT: 30.7 % — ABNORMAL LOW (ref 39.0–52.0)
Hemoglobin: 10 g/dL — ABNORMAL LOW (ref 13.0–17.0)
Immature Granulocytes: 1 %
Lymphocytes Relative: 15 %
Lymphs Abs: 0.9 10*3/uL (ref 0.7–4.0)
MCH: 30.1 pg (ref 26.0–34.0)
MCHC: 32.6 g/dL (ref 30.0–36.0)
MCV: 92.5 fL (ref 80.0–100.0)
Monocytes Absolute: 0.8 10*3/uL (ref 0.1–1.0)
Monocytes Relative: 14 %
Neutro Abs: 4 10*3/uL (ref 1.7–7.7)
Neutrophils Relative %: 66 %
Platelets: 202 10*3/uL (ref 150–400)
RBC: 3.32 MIL/uL — ABNORMAL LOW (ref 4.22–5.81)
RDW: 14.3 % (ref 11.5–15.5)
WBC: 5.9 10*3/uL (ref 4.0–10.5)
nRBC: 0 % (ref 0.0–0.2)

## 2022-05-03 LAB — CBG MONITORING, ED: Glucose-Capillary: 87 mg/dL (ref 70–99)

## 2022-05-03 LAB — LACTIC ACID, PLASMA: Lactic Acid, Venous: 0.9 mmol/L (ref 0.5–1.9)

## 2022-05-03 MED ORDER — SODIUM CHLORIDE 0.9 % IV BOLUS
500.0000 mL | Freq: Once | INTRAVENOUS | Status: AC
  Administered 2022-05-03: 500 mL via INTRAVENOUS

## 2022-05-03 MED ORDER — FENTANYL CITRATE PF 50 MCG/ML IJ SOSY
100.0000 ug | PREFILLED_SYRINGE | Freq: Once | INTRAMUSCULAR | Status: AC
  Administered 2022-05-03: 100 ug via INTRAVENOUS
  Filled 2022-05-03: qty 2

## 2022-05-03 NOTE — ED Notes (Signed)
Checked [facility] RN Jessica informed

## 2022-05-03 NOTE — ED Provider Notes (Signed)
[facility] EMERGENCY DEPARTMENT Provider Note   CSN: [id number] Arrival date & time: 05/03/22  2024     History  Chief Complaint  Patient presents with   Weakness    [name] is a 66 y.o. male.  The history is provided by the patient and medical records.  Weakness [name] is a 66 y.o. male who presents to the Emergency Department complaining of weakness.  He presents to the emergency department accompanied by his son-in-law for evaluation of progressive weakness over the last several weeks.  He is currently undergoing a clinical trial for lung cancer at [facility] and received his second round of chemotherapy.  Over the last several weeks he has been experiencing multiple falls at least 10-12 times.  Today he had a fall this morning and family was able to assist him to the ground.  Son-in-law states that after these falls the patient seems very shaky and has a brief, blank stare.  After these episodes he comes to but is confused for 1 to 2 minutes.  The patient complains of poor focus and poor appetite.  He also complains of progressive right-sided back pain that radiates to the right upper quadrant and chest.  This pain has been constant since January but he feels like it is significantly worsened since these falls.  He takes Xarelto for history of atrial fibrillation.   No fever.  Gets winded easily. No V/D     Home Medications Prior to Admission medications   Medication Sig Start Date End Date Taking? Authorizing Provider  acetaminophen (TYLENOL) 500 MG tablet Take 1,000 mg by mouth every 6 (six) hours as needed for mild pain, fever or headache.    [provider]  benzonatate (TESSALON) 100 MG capsule Take 1 capsule (100 mg total) by mouth 3 (three) times daily as needed for cough. TAKE 1 CAPSULE(100 MG) BY MOUTH THREE TIMES DAILY AS NEEDED FOR COUGH Strength: 100 mg 03/23/22   Heilingoetter, Cassandra L, PA-C  bisacodyl (DULCOLAX) 5 MG EC tablet Take 5  mg by mouth daily as needed (constipation).    [provider]  dexamethasone (DECADRON) 4 MG tablet Take 1 tablet (4 mg total) by mouth daily. 03/05/22   Kyung Rudd, MD  docusate sodium (STOOL SOFTENER) 100 MG capsule Take 100 mg by mouth 2 (two) times daily as needed (constipation).    [provider]  DULoxetine (CYMBALTA) 30 MG capsule Take 1 capsule by mouth 2 times daily. 03/29/22   Pickenpack-Cousar, Carlena Sax, NP  HYDROmorphone (DILAUDID) 8 MG tablet Take 1 tablet by mouth every 6 hours as needed for severe pain. 04/13/22   Pickenpack-Cousar, Carlena Sax, NP  lidocaine-prilocaine (EMLA) cream Apply to the Port-A-Cath site 30-60-minute before treatment Patient taking differently: Apply 1 Application topically once as needed (prior to port access - 30-60 minutes before treatment). 11/09/21   Curt Bears, MD  LORazepam (ATIVAN) 1 MG tablet Take 1 tablet (1 mg total) by mouth every 8 (eight) hours as needed for anxiety. 03/29/22   Pickenpack-Cousar, Carlena Sax, NP  metoprolol succinate (TOPROL-XL) 25 MG 24 hr tablet Take 0.5 tablets (12.5 mg total) by mouth daily. 03/09/22   Elgie Collard, PA-C  morphine (MS CONTIN) 60 MG 12 hr tablet Take 1 tablet by mouth every 12  hours. 04/19/22   Pickenpack-Cousar, Carlena Sax, NP  ondansetron (ZOFRAN) 8 MG tablet Take 1 tablet by mouth every 8 hours as needed for nausea or vomiting. Starting 3 days after  chemotherapy 01/11/22   Pickenpack-Cousar, Carlena Sax, NP  tiZANidine (ZANAFLEX) 2 MG tablet Take 1 tablet (2 mg total) by mouth at bedtime as needed for muscle spasms. 02/02/22   Pickenpack-Cousar, Carlena Sax, NP  XARELTO 20 MG TABS tablet Take 1 tablet (20 mg total) by mouth every evening. 04/14/22   [name], MD      Allergies    Prednisone, Tetanus toxoids, Wellbutrin [bupropion], and Varenicline    Review of Systems   Review of Systems  Neurological:  Positive for weakness.  All other systems reviewed and are negative.   Physical  Exam Updated Vital Signs BP 117/78 (BP Location: Left Arm)   Pulse 79   Temp 98.5 F (36.9 C) (Oral)   Resp 20   Ht 5\' 9"  (1.753 m)   Wt 62.6 kg   SpO2 100%   BMI 20.38 kg/m  Physical Exam Vitals and nursing note reviewed.  Constitutional:      Appearance: He is well-developed.     Comments: frail  HENT:     Head: Normocephalic and atraumatic.  Cardiovascular:     Rate and Rhythm: Normal rate and regular rhythm.     Heart sounds: No murmur heard. Pulmonary:     Effort: Pulmonary effort is normal. No respiratory distress.     Breath sounds: Normal breath sounds.  Abdominal:     Palpations: Abdomen is soft.     Tenderness: There is abdominal tenderness. There is no guarding or rebound.     Comments: Right sided abdominal tenderness, right cva tenderness  Musculoskeletal:     Comments: Mild ttp over right hip  Skin:    General: Skin is warm and dry.  Neurological:     Mental Status: He is alert and oriented to person, place, and time.     Comments: Visual fields grossly intact.  No asymmetry of facial movements.  5/5 strength in BUE, LLE.  RLE 5/5 distally, pain to right hip limits proximal strength testing.  Resting tremor to the left hand  Psychiatric:        Behavior: Behavior normal.     ED Results / Procedures / Treatments   Labs (all labs ordered are listed, but only abnormal results are displayed) Labs Reviewed  COMPREHENSIVE METABOLIC PANEL - Abnormal; Notable for the following components:      Result Value   Sodium 131 (*)    Albumin 3.0 (*)    All other components within normal limits  CBC WITH DIFFERENTIAL/PLATELET - Abnormal; Notable for the following components:   RBC 3.32 (*)    Hemoglobin 10.0 (*)    HCT 30.7 (*)    All other components within normal limits  CULTURE, BLOOD (ROUTINE X 2)  CULTURE, BLOOD (ROUTINE X 2)  LACTIC ACID, PLASMA  URINALYSIS, ROUTINE W REFLEX MICROSCOPIC  CBG MONITORING, ED    EKG EKG  Interpretation  Date/Time:  Monday May 03 2022 23:37:39 EDT Ventricular Rate:  68 PR Interval:  132 QRS Duration: 96 QT Interval:  398 QTC Calculation: 424 R Axis:   69 Text Interpretation: Sinus rhythm Probable left atrial enlargement Low voltage, extremity and precordial leads Confirmed by Quintella Reichert [phone]) on 05/03/2022 11:39:49 PM  Radiology CT Head Wo Contrast  Result Date: 05/04/2022 CLINICAL DATA:  Frequent falls. On chemotherapy. History of hepatitis-C. Neuroendocrine carcinoma. EXAM: CT HEAD WITHOUT CONTRAST CT CERVICAL SPINE WITHOUT CONTRAST CT CHEST, ABDOMEN AND PELVIS WITH CONTRAST TECHNIQUE: Contiguous axial images were obtained from the base of the  skull through the vertex without intravenous contrast. Multidetector CT imaging of the cervical spine was performed without intravenous contrast. Multiplanar CT image reconstructions were also generated. Multidetector CT imaging of the chest, abdomen and pelvis was performed following the standard protocol during bolus administration of intravenous contrast. RADIATION DOSE REDUCTION: This exam was performed according to the departmental dose-optimization program which includes automated exposure control, adjustment of the mA and/or kV according to patient size and/or use of iterative reconstruction technique. CONTRAST:  159mL OMNIPAQUE IOHEXOL 300 MG/ML  SOLN COMPARISON:  Head CT 04/20/2022, CTA neck and reconstructions 11/09/2016, abdomen and pelvis CT 01/14/2022, CTA chest 12/17/2021, and chest, abdomen and pelvis CT 03/14/2022. FINDINGS: CT HEAD FINDINGS Brain: There is known metastatic disease. Focal subcortical hypodensity is again noted in right superior frontal gyrus and left cerebellar hemisphere, and is unchanged. There are chronic lacunar infarcts in the basal ganglia and thalami, mild cerebral atrophy and small vessel disease. Vascular: There are scattered calcifications in the carotid siphons but no hyperdense central  vessels. Skull: No fracture or focal lesion is seen. Sinuses/Orbits: There are small retention cysts and slight membrane thickening again in the right maxillary sinus. Trace fluid in the right mastoid tip. Other sinuses and mastoid air cells are clear. Nasal septum deviates to the right. Other: None. CT CERVICAL FINDINGS Alignment: There is 2 mm grade 1 degenerative anterolisthesis C3-4 and a slight cervical dextroscoliosis. No new alignment abnormality. Narrowing and osteophytes again noted anterolateral tibial joint. Skull base and vertebrae: There is osteopenia without evidence of fractures or focal bone lesion. Interval anterior plate fusion L7-L8 with solid interbody fusion is noted since the prior study. Soft tissues and spinal canal: No prevertebral fluid or swelling. No visible canal hematoma. There are calcifications in both proximal cervical ICAs. Disc levels: There is mild disc space loss C4-5 and C5-6. The discs are otherwise normal in height. There are mild posterior disc osteophyte complexes at C3-4, C4-5 and C5-6 but no significant encroachment on the thecal sac. There are small anterior endplate spurs X2-1 through C6-7. Multilevel facet joint and uncinate spurring. Resulting degenerative foraminal stenosis is mild on the right at C2-3, severe on the right and moderate to severe on the left at C3-4, bilaterally moderate to severe C4-5. Other cervical foramina are patent. Other:  None. CT CHEST FINDINGS Cardiovascular: Right IJ port catheter terminates in the right atrium. The cardiac size is normal. There are coronary artery calcifications and CABG changes. There is no pericardial effusion. There is aortic and great vessel arthrosclerosis without aneurysm, stenosis or dissection. The pulmonary arteries and veins are normal in caliber. Mediastinum/Nodes: There are enlarged right paratracheal lymph nodes, largest of which is in the low right paratracheal space measuring 2.8 x 2.5 cm, on the last study  measuring 2.5 x 3.3 cm. More superiorly in the right paratracheal chain is stable heterogeneous lymph node measuring 1.3 cm in short axis on 2:10. Slightly prominent subcarinal nodes up to 1 cm in short axis are unchanged. No enlarged hilar nodes are seen with calcified right hilar lymph nodes again shown. There is no axillary or supraclavicular adenopathy. No thyroid mass is seen. The trachea is clear. Unremarkable thoracic esophagus. Lungs/Pleura: There are innumerable nodular pulmonary metastases. Largest in the left upper lobe is seen posteriorly and fissural based, measuring stable at 1.8 x 1.1 cm on 3:54. Another index nodule is noted in the left lobe measuring 1.1 cm on 3:116 and is also stable. Another index nodule on the right in the  lower lobe is 1.1 cm on 3:63 posterior to the right hilum it appears very slightly larger today, previously measuring 9.3 mm. Numerous other nodules are not significantly changed. No increased nodularity is suspected. Small loculated posterior basal right pleural effusion appears similar as well as adjacent streaky atelectasis or consolidation in the right lower lung field. There is no pneumothorax or left pleural fluid. The left main bronchus unremarkable. There is increased thickening and narrowing in the right main bronchus. Musculoskeletal: Irregularity and heterogeneity in the posterior right eleventh and twelfth ribs is again seen as well as a nondisplaced presumably pathologic fracture of the posterior right twelfth rib. Similar irregularity again noted in the posterior right ninth rib. There are degenerative changes of the thoracic spine osteopenia. CT ABDOMEN PELVIS FINDINGS Hepatobiliary: There are scattered tiny too small characterize hypodensities in the liver substance, unchanged without mass enhancement. Gallbladder and bile ducts are unremarkable. Pancreas: No focal abnormality. Spleen: Unremarkable. Adrenals/Urinary Tract: There is no adrenal mass. Stable 2.1 cm  left renal cyst not requiring follow-up. There is a 2 mm nonobstructive caliceal stone in the superior pole right kidney and a few subcentimeter too small to characterize hypodensities in the upper right kidney. The bladder is normal thickness considering the degree of distention. Stomach/Bowel: No dilatation or wall thickening, including the appendix. There are colonic diverticula without evidence of diverticulitis. Vascular/Lymphatic: Moderate to heavy mixed plaque in the aorta and iliac arteries. Enlarging aortocaval lymph node now measures 2.4 x 1.9 cm, previously 1.9 x 1.5 cm. No pelvic adenopathy. Reproductive: No prostatomegaly. Other: There is no free air, free hemorrhage or free fluid. There is no incarcerated hernia. Musculoskeletal: Osteopenia degenerative change lumbar spine. No regional fracture is seen or destructive bone lesion at the levels of the abdomen or pelvis. Again noted is a hypervascular mass in the lateral edge of the dorsal paraspinal musculature on the right, level of L2. There are stranding changes in the right flank subcutaneously which are increased from could be related to local trauma. IMPRESSION: 1. No acute intracranial CT findings or depressed skull fractures. 2. Right frontal lobe and left cerebellar stable hypodensities consistent with known metastases. 3. Osteopenia, degenerative and postsurgical changes of the cervical spine without evidence of fractures. 4. Subcutaneous stranding in the right flank region which may be traumatic. No other trauma related acute findings in the chest, abdomen or pelvis. 5. In the chest, interval enlargement is seen in the largest right paratracheal lymph node and there is increased thickening and narrowing of the right main bronchus. 6. Innumerable metastatic pulmonary nodules are mostly not significantly changed. One of those described above does measure slightly larger, in the right lower lobe. 7. Stable small loculated right pleural effusion  and adjacent streaky atelectasis or infiltrate. 8. Interval enlargement of an aortocaval space node in the abdomen. 9. Irregularity most likely due to metastatic disease in the right posterior ninth, eleventh and twelfth ribs with pathologic nondisplaced fracture again noted in the posterior right twelfth rib. 10. Hypervascular mass in the lateral edge of the dorsal paraspinal musculature at the level of L2, unchanged. 11. Aortic and coronary artery atherosclerosis. Electronically Signed   By: Telford Nab M.D.   On: 05/04/2022 01:49   CT Cervical Spine Wo Contrast  Result Date: 05/04/2022 CLINICAL DATA:  Frequent falls. On chemotherapy. History of hepatitis-C. Neuroendocrine carcinoma. EXAM: CT HEAD WITHOUT CONTRAST CT CERVICAL SPINE WITHOUT CONTRAST CT CHEST, ABDOMEN AND PELVIS WITH CONTRAST TECHNIQUE: Contiguous axial images were obtained from the base  of the skull through the vertex without intravenous contrast. Multidetector CT imaging of the cervical spine was performed without intravenous contrast. Multiplanar CT image reconstructions were also generated. Multidetector CT imaging of the chest, abdomen and pelvis was performed following the standard protocol during bolus administration of intravenous contrast. RADIATION DOSE REDUCTION: This exam was performed according to the departmental dose-optimization program which includes automated exposure control, adjustment of the mA and/or kV according to patient size and/or use of iterative reconstruction technique. CONTRAST:  18mL OMNIPAQUE IOHEXOL 300 MG/ML  SOLN COMPARISON:  Head CT 04/20/2022, CTA neck and reconstructions 11/09/2016, abdomen and pelvis CT 01/14/2022, CTA chest 12/17/2021, and chest, abdomen and pelvis CT 03/14/2022. FINDINGS: CT HEAD FINDINGS Brain: There is known metastatic disease. Focal subcortical hypodensity is again noted in right superior frontal gyrus and left cerebellar hemisphere, and is unchanged. There are chronic lacunar  infarcts in the basal ganglia and thalami, mild cerebral atrophy and small vessel disease. Vascular: There are scattered calcifications in the carotid siphons but no hyperdense central vessels. Skull: No fracture or focal lesion is seen. Sinuses/Orbits: There are small retention cysts and slight membrane thickening again in the right maxillary sinus. Trace fluid in the right mastoid tip. Other sinuses and mastoid air cells are clear. Nasal septum deviates to the right. Other: None. CT CERVICAL FINDINGS Alignment: There is 2 mm grade 1 degenerative anterolisthesis C3-4 and a slight cervical dextroscoliosis. No new alignment abnormality. Narrowing and osteophytes again noted anterolateral tibial joint. Skull base and vertebrae: There is osteopenia without evidence of fractures or focal bone lesion. Interval anterior plate fusion K8-L2 with solid interbody fusion is noted since the prior study. Soft tissues and spinal canal: No prevertebral fluid or swelling. No visible canal hematoma. There are calcifications in both proximal cervical ICAs. Disc levels: There is mild disc space loss C4-5 and C5-6. The discs are otherwise normal in height. There are mild posterior disc osteophyte complexes at C3-4, C4-5 and C5-6 but no significant encroachment on the thecal sac. There are small anterior endplate spurs X5-1 through C6-7. Multilevel facet joint and uncinate spurring. Resulting degenerative foraminal stenosis is mild on the right at C2-3, severe on the right and moderate to severe on the left at C3-4, bilaterally moderate to severe C4-5. Other cervical foramina are patent. Other:  None. CT CHEST FINDINGS Cardiovascular: Right IJ port catheter terminates in the right atrium. The cardiac size is normal. There are coronary artery calcifications and CABG changes. There is no pericardial effusion. There is aortic and great vessel arthrosclerosis without aneurysm, stenosis or dissection. The pulmonary arteries and veins are  normal in caliber. Mediastinum/Nodes: There are enlarged right paratracheal lymph nodes, largest of which is in the low right paratracheal space measuring 2.8 x 2.5 cm, on the last study measuring 2.5 x 3.3 cm. More superiorly in the right paratracheal chain is stable heterogeneous lymph node measuring 1.3 cm in short axis on 2:10. Slightly prominent subcarinal nodes up to 1 cm in short axis are unchanged. No enlarged hilar nodes are seen with calcified right hilar lymph nodes again shown. There is no axillary or supraclavicular adenopathy. No thyroid mass is seen. The trachea is clear. Unremarkable thoracic esophagus. Lungs/Pleura: There are innumerable nodular pulmonary metastases. Largest in the left upper lobe is seen posteriorly and fissural based, measuring stable at 1.8 x 1.1 cm on 3:54. Another index nodule is noted in the left lobe measuring 1.1 cm on 3:116 and is also stable. Another index nodule on the right  in the lower lobe is 1.1 cm on 3:63 posterior to the right hilum it appears very slightly larger today, previously measuring 9.3 mm. Numerous other nodules are not significantly changed. No increased nodularity is suspected. Small loculated posterior basal right pleural effusion appears similar as well as adjacent streaky atelectasis or consolidation in the right lower lung field. There is no pneumothorax or left pleural fluid. The left main bronchus unremarkable. There is increased thickening and narrowing in the right main bronchus. Musculoskeletal: Irregularity and heterogeneity in the posterior right eleventh and twelfth ribs is again seen as well as a nondisplaced presumably pathologic fracture of the posterior right twelfth rib. Similar irregularity again noted in the posterior right ninth rib. There are degenerative changes of the thoracic spine osteopenia. CT ABDOMEN PELVIS FINDINGS Hepatobiliary: There are scattered tiny too small characterize hypodensities in the liver substance, unchanged  without mass enhancement. Gallbladder and bile ducts are unremarkable. Pancreas: No focal abnormality. Spleen: Unremarkable. Adrenals/Urinary Tract: There is no adrenal mass. Stable 2.1 cm left renal cyst not requiring follow-up. There is a 2 mm nonobstructive caliceal stone in the superior pole right kidney and a few subcentimeter too small to characterize hypodensities in the upper right kidney. The bladder is normal thickness considering the degree of distention. Stomach/Bowel: No dilatation or wall thickening, including the appendix. There are colonic diverticula without evidence of diverticulitis. Vascular/Lymphatic: Moderate to heavy mixed plaque in the aorta and iliac arteries. Enlarging aortocaval lymph node now measures 2.4 x 1.9 cm, previously 1.9 x 1.5 cm. No pelvic adenopathy. Reproductive: No prostatomegaly. Other: There is no free air, free hemorrhage or free fluid. There is no incarcerated hernia. Musculoskeletal: Osteopenia degenerative change lumbar spine. No regional fracture is seen or destructive bone lesion at the levels of the abdomen or pelvis. Again noted is a hypervascular mass in the lateral edge of the dorsal paraspinal musculature on the right, level of L2. There are stranding changes in the right flank subcutaneously which are increased from could be related to local trauma. IMPRESSION: 1. No acute intracranial CT findings or depressed skull fractures. 2. Right frontal lobe and left cerebellar stable hypodensities consistent with known metastases. 3. Osteopenia, degenerative and postsurgical changes of the cervical spine without evidence of fractures. 4. Subcutaneous stranding in the right flank region which may be traumatic. No other trauma related acute findings in the chest, abdomen or pelvis. 5. In the chest, interval enlargement is seen in the largest right paratracheal lymph node and there is increased thickening and narrowing of the right main bronchus. 6. Innumerable metastatic  pulmonary nodules are mostly not significantly changed. One of those described above does measure slightly larger, in the right lower lobe. 7. Stable small loculated right pleural effusion and adjacent streaky atelectasis or infiltrate. 8. Interval enlargement of an aortocaval space node in the abdomen. 9. Irregularity most likely due to metastatic disease in the right posterior ninth, eleventh and twelfth ribs with pathologic nondisplaced fracture again noted in the posterior right twelfth rib. 10. Hypervascular mass in the lateral edge of the dorsal paraspinal musculature at the level of L2, unchanged. 11. Aortic and coronary artery atherosclerosis. Electronically Signed   By: Telford Nab M.D.   On: 05/04/2022 01:49   CT CHEST ABDOMEN PELVIS W CONTRAST  Result Date: 05/04/2022 CLINICAL DATA:  Frequent falls. On chemotherapy. History of hepatitis-C. Neuroendocrine carcinoma. EXAM: CT HEAD WITHOUT CONTRAST CT CERVICAL SPINE WITHOUT CONTRAST CT CHEST, ABDOMEN AND PELVIS WITH CONTRAST TECHNIQUE: Contiguous axial images were obtained  from the base of the skull through the vertex without intravenous contrast. Multidetector CT imaging of the cervical spine was performed without intravenous contrast. Multiplanar CT image reconstructions were also generated. Multidetector CT imaging of the chest, abdomen and pelvis was performed following the standard protocol during bolus administration of intravenous contrast. RADIATION DOSE REDUCTION: This exam was performed according to the departmental dose-optimization program which includes automated exposure control, adjustment of the mA and/or kV according to patient size and/or use of iterative reconstruction technique. CONTRAST:  127mL OMNIPAQUE IOHEXOL 300 MG/ML  SOLN COMPARISON:  Head CT 04/20/2022, CTA neck and reconstructions 11/09/2016, abdomen and pelvis CT 01/14/2022, CTA chest 12/17/2021, and chest, abdomen and pelvis CT 03/14/2022. FINDINGS: CT HEAD FINDINGS  Brain: There is known metastatic disease. Focal subcortical hypodensity is again noted in right superior frontal gyrus and left cerebellar hemisphere, and is unchanged. There are chronic lacunar infarcts in the basal ganglia and thalami, mild cerebral atrophy and small vessel disease. Vascular: There are scattered calcifications in the carotid siphons but no hyperdense central vessels. Skull: No fracture or focal lesion is seen. Sinuses/Orbits: There are small retention cysts and slight membrane thickening again in the right maxillary sinus. Trace fluid in the right mastoid tip. Other sinuses and mastoid air cells are clear. Nasal septum deviates to the right. Other: None. CT CERVICAL FINDINGS Alignment: There is 2 mm grade 1 degenerative anterolisthesis C3-4 and a slight cervical dextroscoliosis. No new alignment abnormality. Narrowing and osteophytes again noted anterolateral tibial joint. Skull base and vertebrae: There is osteopenia without evidence of fractures or focal bone lesion. Interval anterior plate fusion V4-M0 with solid interbody fusion is noted since the prior study. Soft tissues and spinal canal: No prevertebral fluid or swelling. No visible canal hematoma. There are calcifications in both proximal cervical ICAs. Disc levels: There is mild disc space loss C4-5 and C5-6. The discs are otherwise normal in height. There are mild posterior disc osteophyte complexes at C3-4, C4-5 and C5-6 but no significant encroachment on the thecal sac. There are small anterior endplate spurs Q6-7 through C6-7. Multilevel facet joint and uncinate spurring. Resulting degenerative foraminal stenosis is mild on the right at C2-3, severe on the right and moderate to severe on the left at C3-4, bilaterally moderate to severe C4-5. Other cervical foramina are patent. Other:  None. CT CHEST FINDINGS Cardiovascular: Right IJ port catheter terminates in the right atrium. The cardiac size is normal. There are coronary artery  calcifications and CABG changes. There is no pericardial effusion. There is aortic and great vessel arthrosclerosis without aneurysm, stenosis or dissection. The pulmonary arteries and veins are normal in caliber. Mediastinum/Nodes: There are enlarged right paratracheal lymph nodes, largest of which is in the low right paratracheal space measuring 2.8 x 2.5 cm, on the last study measuring 2.5 x 3.3 cm. More superiorly in the right paratracheal chain is stable heterogeneous lymph node measuring 1.3 cm in short axis on 2:10. Slightly prominent subcarinal nodes up to 1 cm in short axis are unchanged. No enlarged hilar nodes are seen with calcified right hilar lymph nodes again shown. There is no axillary or supraclavicular adenopathy. No thyroid mass is seen. The trachea is clear. Unremarkable thoracic esophagus. Lungs/Pleura: There are innumerable nodular pulmonary metastases. Largest in the left upper lobe is seen posteriorly and fissural based, measuring stable at 1.8 x 1.1 cm on 3:54. Another index nodule is noted in the left lobe measuring 1.1 cm on 3:116 and is also stable. Another index nodule  on the right in the lower lobe is 1.1 cm on 3:63 posterior to the right hilum it appears very slightly larger today, previously measuring 9.3 mm. Numerous other nodules are not significantly changed. No increased nodularity is suspected. Small loculated posterior basal right pleural effusion appears similar as well as adjacent streaky atelectasis or consolidation in the right lower lung field. There is no pneumothorax or left pleural fluid. The left main bronchus unremarkable. There is increased thickening and narrowing in the right main bronchus. Musculoskeletal: Irregularity and heterogeneity in the posterior right eleventh and twelfth ribs is again seen as well as a nondisplaced presumably pathologic fracture of the posterior right twelfth rib. Similar irregularity again noted in the posterior right ninth rib. There are  degenerative changes of the thoracic spine osteopenia. CT ABDOMEN PELVIS FINDINGS Hepatobiliary: There are scattered tiny too small characterize hypodensities in the liver substance, unchanged without mass enhancement. Gallbladder and bile ducts are unremarkable. Pancreas: No focal abnormality. Spleen: Unremarkable. Adrenals/Urinary Tract: There is no adrenal mass. Stable 2.1 cm left renal cyst not requiring follow-up. There is a 2 mm nonobstructive caliceal stone in the superior pole right kidney and a few subcentimeter too small to characterize hypodensities in the upper right kidney. The bladder is normal thickness considering the degree of distention. Stomach/Bowel: No dilatation or wall thickening, including the appendix. There are colonic diverticula without evidence of diverticulitis. Vascular/Lymphatic: Moderate to heavy mixed plaque in the aorta and iliac arteries. Enlarging aortocaval lymph node now measures 2.4 x 1.9 cm, previously 1.9 x 1.5 cm. No pelvic adenopathy. Reproductive: No prostatomegaly. Other: There is no free air, free hemorrhage or free fluid. There is no incarcerated hernia. Musculoskeletal: Osteopenia degenerative change lumbar spine. No regional fracture is seen or destructive bone lesion at the levels of the abdomen or pelvis. Again noted is a hypervascular mass in the lateral edge of the dorsal paraspinal musculature on the right, level of L2. There are stranding changes in the right flank subcutaneously which are increased from could be related to local trauma. IMPRESSION: 1. No acute intracranial CT findings or depressed skull fractures. 2. Right frontal lobe and left cerebellar stable hypodensities consistent with known metastases. 3. Osteopenia, degenerative and postsurgical changes of the cervical spine without evidence of fractures. 4. Subcutaneous stranding in the right flank region which may be traumatic. No other trauma related acute findings in the chest, abdomen or pelvis.  5. In the chest, interval enlargement is seen in the largest right paratracheal lymph node and there is increased thickening and narrowing of the right main bronchus. 6. Innumerable metastatic pulmonary nodules are mostly not significantly changed. One of those described above does measure slightly larger, in the right lower lobe. 7. Stable small loculated right pleural effusion and adjacent streaky atelectasis or infiltrate. 8. Interval enlargement of an aortocaval space node in the abdomen. 9. Irregularity most likely due to metastatic disease in the right posterior ninth, eleventh and twelfth ribs with pathologic nondisplaced fracture again noted in the posterior right twelfth rib. 10. Hypervascular mass in the lateral edge of the dorsal paraspinal musculature at the level of L2, unchanged. 11. Aortic and coronary artery atherosclerosis. Electronically Signed   By: Telford Nab M.D.   On: 05/04/2022 01:49   DG Chest Port 1 View  Result Date: 05/04/2022 CLINICAL DATA:  Frequent falls, weakness, on chemo followed by [facility]. EXAM: DG HIP (WITH OR WITHOUT PELVIS) 2-3V RIGHT; PORTABLE CHEST - 1 VIEW COMPARISON:  Portable chest 04/20/2022, chest CT  03/14/2022, CT abdomen and pelvis and reconstructions 01/14/2022. FINDINGS: Chest: There are innumerable bilateral pulmonary nodules consistent with metastases. Small right pleural effusion is unchanged. No focal pneumonia is evident. Heart size and vascular pattern normal with CABG changes and right chest IJ port catheter again terminating in the right atrium. There is aortic atherosclerosis with stable mediastinum. There is osteopenia mild thoracic levoscoliosis and spondylosis. AP pelvis and AP and frog-leg right hip: There is no evidence of hip fracture or dislocation. There is no evidence of arthropathy or other focal bone abnormality. There are scattered calcifications in the right femoral artery. IMPRESSION: 1. Stable radiographic chest findings with  innumerable nodular lung metastases. No acute abnormality. 2. Small right pleural effusion, unchanged. 3. No AP pelvic fracture or diastasis is seen. No right hip fracture is seen. Electronically Signed   By: Telford Nab M.D.   On: 05/04/2022 00:49   DG Hip Unilat W or Wo Pelvis 2-3 Views Right  Result Date: 05/04/2022 CLINICAL DATA:  Frequent falls, weakness, on chemo followed by [facility]. EXAM: DG HIP (WITH OR WITHOUT PELVIS) 2-3V RIGHT; PORTABLE CHEST - 1 VIEW COMPARISON:  Portable chest 04/20/2022, chest CT 03/14/2022, CT abdomen and pelvis and reconstructions 01/14/2022. FINDINGS: Chest: There are innumerable bilateral pulmonary nodules consistent with metastases. Small right pleural effusion is unchanged. No focal pneumonia is evident. Heart size and vascular pattern normal with CABG changes and right chest IJ port catheter again terminating in the right atrium. There is aortic atherosclerosis with stable mediastinum. There is osteopenia mild thoracic levoscoliosis and spondylosis. AP pelvis and AP and frog-leg right hip: There is no evidence of hip fracture or dislocation. There is no evidence of arthropathy or other focal bone abnormality. There are scattered calcifications in the right femoral artery. IMPRESSION: 1. Stable radiographic chest findings with innumerable nodular lung metastases. No acute abnormality. 2. Small right pleural effusion, unchanged. 3. No AP pelvic fracture or diastasis is seen. No right hip fracture is seen. Electronically Signed   By: Telford Nab M.D.   On: 05/04/2022 00:49    Procedures Procedures    Medications Ordered in ED Medications  HYDROmorphone (DILAUDID) injection 1 mg (has no administration in time range)  fentaNYL (SUBLIMAZE) injection 100 mcg (100 mcg Intravenous Given 05/03/22 2348)  sodium chloride 0.9 % bolus 500 mL ( Intravenous Stopped 05/04/22 0123)  iohexol (OMNIPAQUE) 300 MG/ML solution 100 mL (100 mLs Intravenous Contrast Given 05/04/22  0024)  HYDROmorphone (DILAUDID) injection 1 mg (1 mg Intravenous Given 05/04/22 0115)  fentaNYL (SUBLIMAZE) injection 100 mcg (100 mcg Intravenous Given 05/04/22 0321)  sodium chloride 0.9 % bolus 500 mL (500 mLs Intravenous New Bag/Given 05/04/22 0435)    ED Course/ Medical Decision Making/ A&P                           Medical Decision Making Amount and/or Complexity of Data Reviewed Radiology: ordered.  Risk Prescription drug management. Decision regarding hospitalization.   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  He is currently undergoing investigational drug trial at [facility].  He is frail appearing on evaluation with some mild right lower extremity proximal weakness that seems to be secondary to pain as well as a tremor on intention in the left upper extremity.  He states he is no longer able to text due to difficulty with tremor  and coordination with the left hand.  CT scan of the brain without any progressive changes.  CT chest abdomen pelvis does demonstrate to progressive lymph nodes otherwise stable disease.  Labs significant for mild hyponatremia, stable anemia.  He was treated with IV fluids with no significant change in symptoms.  He is significantly orthostatic after fluid bolus.  It is very possible that some of his symptoms can be attributed to his orthostasis but there is concern for possible seizure episodes as well given the fact that he is confused for a few minutes after the staring spells.  Discussed with Dr. Cheral Marker with neurology-recommends [facility] admission so patient can receive formal neurologic consult as well as EEG.  Discussed with patient findings of studies and he is in agreement with plan for admission.  Hospitalist consulted for admission.       Final Clinical Impression(s) / ED Diagnoses Final diagnoses:  Orthostatic  hypotension    Rx / DC Orders ED Discharge Orders     None         Quintella Reichert, MD 05/04/22 [id number]

## 2022-05-03 NOTE — ED Triage Notes (Signed)
Patient arrived via POV c/o weakness x 3 weeks, with increased falls due to same. Patient is chemo, followed by [facility]. Patient is AO x 4, VS WDL, unable to stand/walk at this time.

## 2022-05-04 ENCOUNTER — Encounter (HOSPITAL_COMMUNITY): Payer: Self-pay

## 2022-05-04 ENCOUNTER — Emergency Department (HOSPITAL_BASED_OUTPATIENT_CLINIC_OR_DEPARTMENT_OTHER): Payer: Medicare Other

## 2022-05-04 ENCOUNTER — Inpatient Hospital Stay: Payer: Medicare Other | Admitting: Nurse Practitioner

## 2022-05-04 ENCOUNTER — Encounter: Payer: Self-pay | Admitting: *Deleted

## 2022-05-04 ENCOUNTER — Inpatient Hospital Stay (HOSPITAL_COMMUNITY)
Admit: 2022-05-04 | Discharge: 2022-05-04 | Disposition: A | Discharge status: Home / Self Care | Payer: Medicare Other | Attending: Internal Medicine | Admitting: Internal Medicine

## 2022-05-04 DIAGNOSIS — I129 Hypertensive chronic kidney disease with stage 1 through stage 4 chronic kidney disease, or unspecified chronic kidney disease: Secondary | ICD-10-CM | POA: Diagnosis present

## 2022-05-04 DIAGNOSIS — E876 Hypokalemia: Secondary | ICD-10-CM | POA: Diagnosis present

## 2022-05-04 DIAGNOSIS — N1831 Chronic kidney disease, stage 3a: Secondary | ICD-10-CM | POA: Diagnosis present

## 2022-05-04 DIAGNOSIS — F32A Depression, unspecified: Secondary | ICD-10-CM | POA: Diagnosis present

## 2022-05-04 DIAGNOSIS — I25709 Atherosclerosis of coronary artery bypass graft(s), unspecified, with unspecified angina pectoris: Secondary | ICD-10-CM | POA: Diagnosis not present

## 2022-05-04 DIAGNOSIS — R531 Weakness: Secondary | ICD-10-CM

## 2022-05-04 DIAGNOSIS — K219 Gastro-esophageal reflux disease without esophagitis: Secondary | ICD-10-CM | POA: Diagnosis present

## 2022-05-04 DIAGNOSIS — R251 Tremor, unspecified: Secondary | ICD-10-CM

## 2022-05-04 DIAGNOSIS — F319 Bipolar disorder, unspecified: Secondary | ICD-10-CM | POA: Diagnosis present

## 2022-05-04 DIAGNOSIS — R569 Unspecified convulsions: Secondary | ICD-10-CM

## 2022-05-04 DIAGNOSIS — R296 Repeated falls: Secondary | ICD-10-CM | POA: Diagnosis present

## 2022-05-04 DIAGNOSIS — Z681 Body mass index (BMI) 19 or less, adult: Secondary | ICD-10-CM | POA: Diagnosis not present

## 2022-05-04 DIAGNOSIS — Z7189 Other specified counseling: Secondary | ICD-10-CM | POA: Diagnosis not present

## 2022-05-04 DIAGNOSIS — Z23 Encounter for immunization: Secondary | ICD-10-CM | POA: Diagnosis present

## 2022-05-04 DIAGNOSIS — E46 Unspecified protein-calorie malnutrition: Secondary | ICD-10-CM | POA: Diagnosis present

## 2022-05-04 DIAGNOSIS — M79671 Pain in right foot: Secondary | ICD-10-CM | POA: Diagnosis not present

## 2022-05-04 DIAGNOSIS — Z66 Do not resuscitate: Secondary | ICD-10-CM | POA: Diagnosis present

## 2022-05-04 DIAGNOSIS — I951 Orthostatic hypotension: Secondary | ICD-10-CM | POA: Diagnosis present

## 2022-05-04 DIAGNOSIS — G939 Disorder of brain, unspecified: Secondary | ICD-10-CM | POA: Diagnosis present

## 2022-05-04 DIAGNOSIS — R5381 Other malaise: Secondary | ICD-10-CM | POA: Diagnosis present

## 2022-05-04 DIAGNOSIS — C7951 Secondary malignant neoplasm of bone: Secondary | ICD-10-CM | POA: Diagnosis present

## 2022-05-04 DIAGNOSIS — I480 Paroxysmal atrial fibrillation: Secondary | ICD-10-CM | POA: Diagnosis present

## 2022-05-04 DIAGNOSIS — R52 Pain, unspecified: Secondary | ICD-10-CM | POA: Diagnosis not present

## 2022-05-04 DIAGNOSIS — I252 Old myocardial infarction: Secondary | ICD-10-CM | POA: Diagnosis not present

## 2022-05-04 DIAGNOSIS — C7A1 Malignant poorly differentiated neuroendocrine tumors: Secondary | ICD-10-CM | POA: Diagnosis present

## 2022-05-04 DIAGNOSIS — Z006 Encounter for examination for normal comparison and control in clinical research program: Secondary | ICD-10-CM | POA: Diagnosis not present

## 2022-05-04 DIAGNOSIS — Z515 Encounter for palliative care: Secondary | ICD-10-CM | POA: Diagnosis not present

## 2022-05-04 DIAGNOSIS — I2510 Atherosclerotic heart disease of native coronary artery without angina pectoris: Secondary | ICD-10-CM | POA: Diagnosis present

## 2022-05-04 DIAGNOSIS — W19XXXD Unspecified fall, subsequent encounter: Secondary | ICD-10-CM | POA: Diagnosis not present

## 2022-05-04 DIAGNOSIS — W19XXXA Unspecified fall, initial encounter: Secondary | ICD-10-CM

## 2022-05-04 DIAGNOSIS — Z951 Presence of aortocoronary bypass graft: Secondary | ICD-10-CM | POA: Diagnosis not present

## 2022-05-04 DIAGNOSIS — Z79899 Other long term (current) drug therapy: Secondary | ICD-10-CM | POA: Diagnosis not present

## 2022-05-04 DIAGNOSIS — E7800 Pure hypercholesterolemia, unspecified: Secondary | ICD-10-CM | POA: Diagnosis present

## 2022-05-04 DIAGNOSIS — D638 Anemia in other chronic diseases classified elsewhere: Secondary | ICD-10-CM | POA: Diagnosis present

## 2022-05-04 DIAGNOSIS — Z888 Allergy status to other drugs, medicaments and biological substances status: Secondary | ICD-10-CM | POA: Diagnosis not present

## 2022-05-04 DIAGNOSIS — Z87891 Personal history of nicotine dependence: Secondary | ICD-10-CM | POA: Diagnosis not present

## 2022-05-04 DIAGNOSIS — D3A8 Other benign neuroendocrine tumors: Secondary | ICD-10-CM | POA: Diagnosis present

## 2022-05-04 DIAGNOSIS — N183 Chronic kidney disease, stage 3 unspecified: Secondary | ICD-10-CM | POA: Diagnosis present

## 2022-05-04 DIAGNOSIS — C7931 Secondary malignant neoplasm of brain: Secondary | ICD-10-CM | POA: Diagnosis present

## 2022-05-04 DIAGNOSIS — Z85828 Personal history of other malignant neoplasm of skin: Secondary | ICD-10-CM | POA: Diagnosis not present

## 2022-05-04 DIAGNOSIS — R55 Syncope and collapse: Secondary | ICD-10-CM | POA: Diagnosis not present

## 2022-05-04 DIAGNOSIS — G9341 Metabolic encephalopathy: Secondary | ICD-10-CM | POA: Diagnosis present

## 2022-05-04 DIAGNOSIS — D631 Anemia in chronic kidney disease: Secondary | ICD-10-CM | POA: Diagnosis present

## 2022-05-04 DIAGNOSIS — F419 Anxiety disorder, unspecified: Secondary | ICD-10-CM | POA: Diagnosis present

## 2022-05-04 DIAGNOSIS — I739 Peripheral vascular disease, unspecified: Secondary | ICD-10-CM | POA: Diagnosis present

## 2022-05-04 DIAGNOSIS — C7A8 Other malignant neuroendocrine tumors: Secondary | ICD-10-CM | POA: Diagnosis not present

## 2022-05-04 DIAGNOSIS — E871 Hypo-osmolality and hyponatremia: Secondary | ICD-10-CM | POA: Diagnosis present

## 2022-05-04 DIAGNOSIS — Z923 Personal history of irradiation: Secondary | ICD-10-CM | POA: Diagnosis not present

## 2022-05-04 LAB — URINALYSIS, ROUTINE W REFLEX MICROSCOPIC
Bilirubin Urine: NEGATIVE
Glucose, UA: NEGATIVE mg/dL
Hgb urine dipstick: NEGATIVE
Ketones, ur: NEGATIVE mg/dL
Leukocytes,Ua: NEGATIVE
Nitrite: NEGATIVE
Protein, ur: NEGATIVE mg/dL
Specific Gravity, Urine: 1.01 (ref 1.005–1.030)
pH: 7 (ref 5.0–8.0)

## 2022-05-04 MED ORDER — CHLORHEXIDINE GLUCONATE 0.12% ORAL RINSE (MEDLINE KIT)
15.0000 mL | Freq: Two times a day (BID) | OROMUCOSAL | Status: DC
  Administered 2022-05-04 – 2022-05-13 (×14): 15 mL via OROMUCOSAL

## 2022-05-04 MED ORDER — HYDROMORPHONE HCL 1 MG/ML IJ SOLN
1.0000 mg | INTRAMUSCULAR | Status: DC | PRN
  Administered 2022-05-04 – 2022-05-05 (×6): 1 mg via INTRAVENOUS
  Filled 2022-05-04 (×6): qty 1

## 2022-05-04 MED ORDER — CHLORHEXIDINE GLUCONATE CLOTH 2 % EX PADS
6.0000 | MEDICATED_PAD | Freq: Every day | CUTANEOUS | Status: DC
  Administered 2022-05-04 – 2022-05-13 (×7): 6 via TOPICAL

## 2022-05-04 MED ORDER — SODIUM CHLORIDE 0.9 % IV SOLN: INTRAVENOUS | Status: DC

## 2022-05-04 MED ORDER — ONDANSETRON HCL 4 MG/2ML IJ SOLN
4.0000 mg | Freq: Once | INTRAMUSCULAR | Status: AC
  Administered 2022-05-04: 4 mg via INTRAVENOUS

## 2022-05-04 MED ORDER — SODIUM CHLORIDE 0.9 % IV BOLUS
500.0000 mL | Freq: Once | INTRAVENOUS | Status: AC
Start: 2022-05-04 — End: 2022-05-04
  Administered 2022-05-04: 500 mL via INTRAVENOUS

## 2022-05-04 MED ORDER — ONDANSETRON HCL 4 MG PO TABS
4.0000 mg | ORAL_TABLET | Freq: Four times a day (QID) | ORAL | Status: DC | PRN
  Administered 2022-05-13: 4 mg via ORAL
  Filled 2022-05-04: qty 1

## 2022-05-04 MED ORDER — FENTANYL CITRATE PF 50 MCG/ML IJ SOSY
100.0000 ug | PREFILLED_SYRINGE | Freq: Once | INTRAMUSCULAR | Status: AC
  Administered 2022-05-04: 100 ug via INTRAVENOUS
  Filled 2022-05-04: qty 2

## 2022-05-04 MED ORDER — ORAL CARE MOUTH RINSE
15.0000 mL | OROMUCOSAL | Status: DC
  Administered 2022-05-05 – 2022-05-13 (×40): 15 mL via OROMUCOSAL

## 2022-05-04 MED ORDER — LORAZEPAM 1 MG PO TABS
1.0000 mg | ORAL_TABLET | Freq: Three times a day (TID) | ORAL | Status: DC | PRN
  Administered 2022-05-05 – 2022-05-06 (×3): 1 mg via ORAL
  Filled 2022-05-04 (×4): qty 1

## 2022-05-04 MED ORDER — ONDANSETRON HCL 4 MG/2ML IJ SOLN
INTRAMUSCULAR | Status: AC
  Filled 2022-05-04: qty 2

## 2022-05-04 MED ORDER — BENZONATATE 100 MG PO CAPS
100.0000 mg | ORAL_CAPSULE | Freq: Three times a day (TID) | ORAL | Status: DC | PRN
Start: 2022-05-04 — End: 2022-05-13
  Administered 2022-05-13: 100 mg via ORAL
  Filled 2022-05-04: qty 1

## 2022-05-04 MED ORDER — RIVAROXABAN 20 MG PO TABS
20.0000 mg | ORAL_TABLET | Freq: Every evening | ORAL | Status: DC
  Administered 2022-05-04 – 2022-05-12 (×9): 20 mg via ORAL
  Filled 2022-05-04 (×9): qty 1

## 2022-05-04 MED ORDER — ONDANSETRON HCL 4 MG/2ML IJ SOLN
4.0000 mg | Freq: Four times a day (QID) | INTRAMUSCULAR | Status: DC | PRN
  Administered 2022-05-05 – 2022-05-12 (×9): 4 mg via INTRAVENOUS
  Filled 2022-05-04 (×9): qty 2

## 2022-05-04 MED ORDER — DULOXETINE HCL 30 MG PO CPEP
30.0000 mg | ORAL_CAPSULE | Freq: Two times a day (BID) | ORAL | Status: DC
  Administered 2022-05-04 – 2022-05-06 (×4): 30 mg via ORAL
  Filled 2022-05-04 (×4): qty 1

## 2022-05-04 MED ORDER — BISACODYL 5 MG PO TBEC: 5.0000 mg | DELAYED_RELEASE_TABLET | Freq: Every day | ORAL | Status: DC | PRN

## 2022-05-04 MED ORDER — IOHEXOL 300 MG/ML  SOLN
100.0000 mL | Freq: Once | INTRAMUSCULAR | Status: AC | PRN
  Administered 2022-05-04: 100 mL via INTRAVENOUS

## 2022-05-04 MED ORDER — HYDROMORPHONE HCL 1 MG/ML IJ SOLN
1.0000 mg | Freq: Once | INTRAMUSCULAR | Status: AC
  Administered 2022-05-04: 1 mg via INTRAVENOUS
  Filled 2022-05-04: qty 1

## 2022-05-04 NOTE — ED Notes (Signed)
Updated pt's wife- [name] re: transfer to [facility].

## 2022-05-04 NOTE — Progress Notes (Signed)
EEG complete - results pending 

## 2022-05-04 NOTE — ED Notes (Signed)
Report given to [facility] 

## 2022-05-04 NOTE — Plan of Care (Signed)
[facility] will assume care on arrival to accepting facility. Until arrival, care as per EDP. However, [facility] available 24/7 for questions and assistance.  Nursing staff, please page [facility] Admits and Consults ([phone]) as soon as the patient arrives to the hospital.   

## 2022-05-04 NOTE — Consult Note (Signed)
NEURO HOSPITALIST CONSULT NOTE   Requestig physician: Dr. Marylyn Ishihara  Reason for Consult: Progressive weakness  History obtained from:  Patient and Chart     HPI:                                                                                                                                          [name] is an 66 y.o. male with a PMHx of atrial fibrillation (on Xarelto), anemia, basal cell carcinoma, CAD s/p CABG, carotid artery disease, ectopic atrial tachycardia, hepatitis C (treated and cured per Epic), hypercholesterolemia, renal stones, HTN, ischemic cardiomyopathy, metastatic large cell neuroendocrine carcinoma, s/p whole-brain irradiation for brain metastases one month ago, migraines, small stroke in 2018, symptomatic hypotension and syncope who initially presented to the [facility] ED on Monday night for evaluation of progressive weakness over the last several weeks. He is currently undergoing a clinical trial for lung cancer at [facility] and received his second round of chemotherapy. Over the last several weeks he has been experiencing multiple falls. These have occurred approximately 10-12 times. On the day of presentation, he had a fall in the morning and family was able to assist him to the ground.  Family member stated that after these falls the patient seems very shaky and has a brief, blank stare. After these episodes he comes to but is confused for 1 to 2 minutes. The patient has had complaints of poor focus and poor appetite.  He also has had progressive right-sided back pain that radiates to the right upper quadrant and chest. This pain has been constant since January but he feels like it has significantly worsened since his falls. He states he is no longer able to text due to difficulty with tremor and coordination with the left hand. Initial exam by EDP revealed a frail appearing patient with some mild right lower extremity proximal weakness that seemed to be secondary to  pain, as well as a tremor on intention in the left upper extremity. He was orthostatic and continued to be so after a fluid bolus. CBG was 87.   ED Course: CT scan of the brain without any progressive changes.  CT chest abdomen pelvis demonstrated progressive lymph nodes, otherwise stable disease.  Labs were significant for mild hyponatremia and stable anemia.  Past Medical History:  Diagnosis Date   Anemia    Anxiety    Arthritis    Basal cell carcinoma (BCC) of forehead    CAD (coronary artery disease)    a. 10/2015 ant STEMI >> LHC with 3 v CAD; oLAD tx with POBA >> emergent CABG. b. Multiple evals since that time, early graft failure of SVG-RCA by cath 03/2016. c. 2/19 PCI/DES x1 to pRCA, normal EF.   Carotid artery disease ([place])  a. 40-59% BICA 02/2018.   Depression    Dyspnea    Ectopic atrial tachycardia (HCC)    Esophageal reflux    eosinophil esophagitis   Family history of adverse reaction to anesthesia    "sister has PONV" (06/21/2017)   Former tobacco use    Gout    Hepatitis C    "treated and cured" (06/21/2017)   High cholesterol    History of blood transfusion    History of kidney stones    Hypertension    Ischemic cardiomyopathy    a. EF 25-30% at intraop TEE 4/17  //  b. Limited Echo 5/17 - EF 45-50%, mild ant HK. c. EF 55-65% by cath 09/2017.   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[place])    Migraine    "3-4/yr" (06/21/2017)   Myocardial infarction ([place]) 10/2015   Palpitations    Pneumonia    Sinus bradycardia    a. HR dropping into 40s in 02/2016 -> BB reduced.   Stroke [facility]) 10/2016   "small one; sometimes my memory/cognitive issues" (06/21/2017)   Symptomatic hypotension    a. 02/2016 ER visit -> meds reduced.   Syncope    Wears dentures    Wears glasses    Past Surgical History:  Procedure Laterality Date   25 GAUGE PARS PLANA VITRECTOMY WITH 20 GAUGE MVR PORT  12/31/2020   Procedure: 25 GAUGE PARS PLANA VITRECTOMY WITH 20 GAUGE MVR PORT;  Surgeon:  [name], MD;  Location: [place];  Service: Thoracic;;   ANTERIOR CERVICAL DECOMP/DISCECTOMY FUSION N/A 10/17/2018   Procedure: Anterior Cervical Decompression Fusion - Cervical seven -Thoracic one;  Surgeon: Consuella Lose, MD;  Location: [place];  Service: Neurosurgery;  Laterality: N/A;   BASAL CELL CARCINOMA EXCISION     "forehead   BIOPSY  07/20/2019   Procedure: BIOPSY;  Surgeon: Carol Ada, MD;  Location: [facility];  Service: Endoscopy;;   BIOPSY OF MEDIASTINAL MASS Right 07/29/2021   Procedure: RIGHT CHEST WALL MASS BIOPSY;  Surgeon: [name], MD;  Location: [place];  Service: Thoracic;  Laterality: Right;   BRONCHIAL BIOPSY  12/24/2020   Procedure: BRONCHIAL BIOPSIES;  Surgeon: Garner Nash, DO;  Location: [place] [facility];  Service: Pulmonary;;   BRONCHIAL BRUSHINGS  12/24/2020   Procedure: BRONCHIAL BRUSHINGS;  Surgeon: Garner Nash, DO;  Location: [place];  Service: Pulmonary;;   BRONCHIAL NEEDLE ASPIRATION BIOPSY  12/24/2020   Procedure: BRONCHIAL NEEDLE ASPIRATION BIOPSIES;  Surgeon: Garner Nash, DO;  Location: [place];  Service: Pulmonary;;   CARDIAC CATHETERIZATION N/A 11/28/2015   Procedure: Left Heart Cath and Coronary Angiography;  Surgeon: [name], MD;  Location: [place] CV [facility];  Service: Cardiovascular;  Laterality: N/A;   CARDIAC CATHETERIZATION N/A 11/28/2015   Procedure: Coronary Balloon Angioplasty;  Surgeon: [name], MD;  Location: [place] CV [facility];  Service: Cardiovascular;  Laterality: N/A;  ostial LAD   CARDIAC CATHETERIZATION N/A 11/28/2015   Procedure: Coronary/Graft Angiography;  Surgeon: [name], MD;  Location: [place] [facility];  Service: Cardiovascular;  Laterality: N/A;  coronaries only    CARDIAC CATHETERIZATION N/A 04/21/2016   Procedure: Left Heart Cath and Coronary Angiography;  Surgeon: Wellington Hampshire, MD;  Location: [place] [facility];  Service: Cardiovascular;   Laterality: N/A;   CARDIAC CATHETERIZATION N/A 06/14/2016   Procedure: Left Heart Cath and Cors/Grafts Angiography;  Surgeon: Lorretta Harp, MD;  Location: [place] CV LAB;  Service: Cardiovascular;  Laterality:  N/A;   CARDIAC CATHETERIZATION N/A 09/08/2016   Procedure: Left Heart Cath and Cors/Grafts Angiography;  Surgeon: Wellington Hampshire, MD;  Location: [place] [facility];  Service: Cardiovascular;  Laterality: N/A;   CARDIAC CATHETERIZATION     CORONARY ARTERY BYPASS GRAFT N/A 11/28/2015   Procedure: CORONARY ARTERY BYPASS GRAFTING (CABG) TIMES FIVE USING LEFT INTERNAL MAMMARY ARTERY AND RIGHT GREATER SAPHENOUS,VIEN HARVEATED BY ENDOVIEN, INTRAOPPRATIVE TEE;  Surgeon: Gaye Pollack, MD;  Location: [place];  Service: Open Heart Surgery;  Laterality: N/A;   CORONARY STENT INTERVENTION N/A 10/05/2017   Procedure: CORONARY STENT INTERVENTION;  Surgeon: [name], MD;  Location: [place] CV LAB;  Service: Cardiovascular;  Laterality: N/A;   ESOPHAGOGASTRODUODENOSCOPY (EGD) WITH PROPOFOL N/A 07/20/2019   Procedure: ESOPHAGOGASTRODUODENOSCOPY (EGD) WITH PROPOFOL;  Surgeon: Carol Ada, MD;  [facility];  Service: Endoscopy;  Laterality: N/A;   ESOPHAGOGASTRODUODENOSCOPY (EGD) WITH PROPOFOL N/A 01/30/2021   Procedure: ESOPHAGOGASTRODUODENOSCOPY (EGD) WITH PROPOFOL;  Surgeon: Carol Ada, MD;  [facility];  Service: Endoscopy;  Laterality: N/A;   FIDUCIAL MARKER PLACEMENT  12/24/2020   Procedure: FIDUCIAL DYE MARKER PLACEMENT;  Surgeon: Garner Nash, DO;  Location: [place] [facility];  Service: Pulmonary;;   HUMERUS SURGERY Right 1969   "tumor inside bone; filled it w/bone chips"   INTERCOSTAL NERVE BLOCK Right 12/24/2020   Procedure: INTERCOSTAL NERVE BLOCK;  Surgeon: [name], MD;  Location: [place];  Service: Thoracic;  Laterality: Right;   IR IMAGING GUIDED PORT INSERTION  02/06/2021   LEFT HEART CATH AND CORS/GRAFTS ANGIOGRAPHY N/A 03/11/2017    Procedure: Left Heart Cath and Cors/Grafts Angiography;  Surgeon: Leonie Man, MD;  Location: [place] [facility];  Service: Cardiovascular;  Laterality: N/A;   LEFT HEART CATH AND CORS/GRAFTS ANGIOGRAPHY N/A 10/05/2017   Procedure: LEFT HEART CATH AND CORS/GRAFTS ANGIOGRAPHY;  Surgeon: [name], MD;  Location: [place] [facility];  Service: Cardiovascular;  Laterality: N/A;   LEFT HEART CATH AND CORS/GRAFTS ANGIOGRAPHY N/A 04/11/2019   Procedure: LEFT HEART CATH AND CORS/GRAFTS ANGIOGRAPHY;  Surgeon: [name], MD;  Location: [place] [facility];  Service: Cardiovascular;  Laterality: N/A;   NODE DISSECTION Right 12/24/2020   Procedure: NODE DISSECTION;  Surgeon: [name], MD;  Location: [place];  Service: Thoracic;  Laterality: Right;   PERIPHERAL VASCULAR CATHETERIZATION N/A 06/14/2016   Procedure: Lower Extremity Angiography;  Surgeon: Lorretta Harp, MD;  Location: [place] [facility];  Service: Cardiovascular;  Laterality: N/A;   VIDEO BRONCHOSCOPY WITH ENDOBRONCHIAL NAVIGATION Right 12/24/2020   Procedure: VIDEO BRONCHOSCOPY WITH ENDOBRONCHIAL NAVIGATION;  Surgeon: Garner Nash, DO;  Location: [place];  Service: Pulmonary;  Laterality: Right;   VIDEO BRONCHOSCOPY WITH ENDOBRONCHIAL ULTRASOUND N/A 12/24/2020   Procedure: VIDEO BRONCHOSCOPY WITH ENDOBRONCHIAL ULTRASOUND;  Surgeon: Garner Nash, DO;  Location: [place];  Service: Pulmonary;  Laterality: N/A;   VIDEO BRONCHOSCOPY WITH INSERTION OF INTERBRONCHIAL VALVE (IBV) N/A 12/31/2020   Procedure: VIDEO BRONCHOSCOPY WITH INSERTION OF INTERBRONCHIAL VALVE (IBV).VALVE IN CARTRIDGE 54m,9mm. CHEST TUBE PLACEMENT.;  Surgeon: LLajuana Matte MD;  [facility];  Service: Thoracic;  Laterality: N/A;   VIDEO BRONCHOSCOPY WITH INSERTION OF INTERBRONCHIAL VALVE (IBV) N/A 07/29/2021   Procedure: VIDEO BRONCHOSCOPY WITH REMOVAL OF INTERBRONCHIAL VALVE (IBV);  Surgeon: LLajuana Matte MD;   [facility]  Service: Thoracic;  Laterality: N/A;    Family History  Problem Relation Age of Onset   Lung cancer Mother  Heart Problems Father    Heart attack Father 45   Stroke Father    Heart failure Father    Heart attack Maternal Grandmother    Stroke Maternal Grandmother    Heart attack Paternal Uncle    Hypertension Brother    Autoimmune disease Neg Hx             Social History:  reports that he quit smoking about 6 years ago. His smoking use included cigarettes. He has a 33.00 pack-year smoking history. He has never used smokeless tobacco. He reports that he does not currently use alcohol. He reports that he does not use drugs.  Allergies  Allergen Reactions   Prednisone Other (See Comments)    States that this med makes him "crazy"   Tetanus Toxoids Swelling and Other (See Comments)    Fever, Swelling of the arm    Wellbutrin [Bupropion] Other (See Comments)    Crazy thoughts, nightmares   Varenicline Other (See Comments)    Unpleasant dreams    MEDICATIONS:                                                                                                                     Prior to Admission:  Medications Prior to Admission  Medication Sig Dispense Refill Last Dose   acetaminophen (TYLENOL) 500 MG tablet Take 1,000 mg by mouth every 6 (six) hours as needed for mild pain, fever or headache.   05/03/2022   benzonatate (TESSALON) 100 MG capsule Take 1 capsule (100 mg total) by mouth 3 (three) times daily as needed for cough. TAKE 1 CAPSULE(100 MG) BY MOUTH THREE TIMES DAILY AS NEEDED FOR COUGH Strength: 100 mg 30 capsule 2 Past Week   bisacodyl (DULCOLAX) 5 MG EC tablet Take 5 mg by mouth daily as needed (constipation).   Past Week   docusate sodium (STOOL SOFTENER) 100 MG capsule Take 100 mg by mouth 2 (two) times daily as needed (constipation).   unknown   DULoxetine (CYMBALTA) 30 MG capsule Take 1 capsule by mouth 2 times daily. 60 capsule 1 05/03/2022    HYDROmorphone (DILAUDID) 8 MG tablet Take 1 tablet by mouth every 6 hours as needed for severe pain. 60 tablet 0 05/03/2022   lidocaine-prilocaine (EMLA) cream Apply to the Port-A-Cath site 30-60-minute before treatment (Patient taking differently: Apply 1 Application topically once as needed (prior to port access - 30-60 minutes before treatment).) 30 g 0 unknown   LORazepam (ATIVAN) 1 MG tablet Take 1 tablet (1 mg total) by mouth every 8 (eight) hours as needed for anxiety. 60 tablet 0 05/03/2022   metoprolol succinate (TOPROL-XL) 25 MG 24 hr tablet Take 0.5 tablets (12.5 mg total) by mouth daily. (Patient taking differently: Take 12.5 mg by mouth daily as needed (high blood pressure). Take 1/2 tablet (12.5 mg) if BP>120.) 45 tablet 3 05/03/2022 at 1200   morphine (MS CONTIN) 60 MG 12 hr tablet Take 1 tablet by mouth every 12  hours. 60 tablet 0  05/03/2022   ondansetron (ZOFRAN) 8 MG tablet Take 1 tablet by mouth every 8 hours as needed for nausea or vomiting. Starting 3 days after chemotherapy 90 tablet 2 unknown   prochlorperazine (COMPAZINE) 10 MG tablet Take 10 mg by mouth every 6 (six) hours as needed for nausea or vomiting.   unknown   XARELTO 20 MG TABS tablet Take 1 tablet (20 mg total) by mouth every evening. 90 tablet 1 05/03/2022 at 1445   dexamethasone (DECADRON) 4 MG tablet Take 1 tablet (4 mg total) by mouth daily. (Patient not taking: Reported on 05/04/2022) 40 tablet 0 Not Taking   tiZANidine (ZANAFLEX) 2 MG tablet Take 1 tablet (2 mg total) by mouth at bedtime as needed for muscle spasms. (Patient not taking: Reported on 05/04/2022) 30 tablet 2 Not Taking   Scheduled:  chlorhexidine gluconate (MEDLINE KIT)  15 mL Mouth Rinse BID   Chlorhexidine Gluconate Cloth  6 each Topical Daily   DULoxetine  30 mg Oral BID   mouth rinse  15 mL Mouth Rinse 10 times per day   rivaroxaban  20 mg Oral QPM   Continuous:  sodium chloride 75 mL/hr at 05/04/22 1743     ROS:                                                                                                                                        As per HPI.   Blood pressure 126/77, pulse 70, temperature 98.1 F (36.7 C), temperature source Oral, resp. rate 16, height 5' 9" (1.753 m), weight 62.6 kg, SpO2 100 %.   General Examination:                                                                                                       Physical Exam  HEENT-  [place]/AT   Lungs- Respirations unlabored Extremities- No edema  Neurological Examination Mental Status: Awake and alert. Oriented x 5. Speech fluent with intact comprehension and naming. Memory within normal limits. Normal latencies of motor and verbal responses. Pleasant and cooperative.  Cranial Nerves: II: Temporal visual fields intact with no extinction to DSS. PERRL.   III,IV, VI: EOMI and with smooth visual pursuits. No nystagmus. No ptosis.  V: Temp sensation equal bilaterally VII: Smile symmetric VIII: Hearing intact to conversation.  IX,X: Palate rises symmetrically XI: Symmetric shoulder shrug XII: Midline tongue extension but with tongue tremor elicited by protrusion.  Motor: Right : Upper extremity   5/5  Left:     Upper extremity   5/5  Lower extremity   5/5     Lower extremity   5/5 Decreased muscle bulk x 4 Sensory: Temp and light touch intact throughout, bilaterally Deep Tendon Reflexes: 3+ bilateral brachioradialis and biceps. 4+ bilateral patellae (2-3 beats of clonus), 2+ achilles.  Cerebellar: No ataxia with FNF, but with prominent action tremor bilaterally  Gait: Deferred   Lab Results: Basic Metabolic Panel: Recent Labs  Lab 05/03/22 2329  NA 131*  K 3.7  CL 99  CO2 24  GLUCOSE 95  BUN 16  CREATININE 0.91  CALCIUM 9.0    CBC: Recent Labs  Lab 05/03/22 2329  WBC 5.9  NEUTROABS 4.0  HGB 10.0*  HCT 30.7*  MCV 92.5  PLT 202    Cardiac Enzymes: No results for input(s): "CKTOTAL", "CKMB", "CKMBINDEX", "TROPONINI" in the last 168  hours.  Lipid Panel: No results for input(s): "CHOL", "TRIG", "HDL", "CHOLHDL", "VLDL", "LDLCALC" in the last 168 hours.  Imaging: CT Head Wo Contrast  Result Date: 05/04/2022 CLINICAL DATA:  Frequent falls. On chemotherapy. History of hepatitis-C. Neuroendocrine carcinoma. EXAM: CT HEAD WITHOUT CONTRAST CT CERVICAL SPINE WITHOUT CONTRAST CT CHEST, ABDOMEN AND PELVIS WITH CONTRAST TECHNIQUE: Contiguous axial images were obtained from the base of the skull through the vertex without intravenous contrast. Multidetector CT imaging of the cervical spine was performed without intravenous contrast. Multiplanar CT image reconstructions were also generated. Multidetector CT imaging of the chest, abdomen and pelvis was performed following the standard protocol during bolus administration of intravenous contrast. RADIATION DOSE REDUCTION: This exam was performed according to the departmental dose-optimization program which includes automated exposure control, adjustment of the mA and/or kV according to patient size and/or use of iterative reconstruction technique. CONTRAST:  177m OMNIPAQUE IOHEXOL 300 MG/ML  SOLN COMPARISON:  Head CT 04/20/2022, CTA neck and reconstructions 11/09/2016, abdomen and pelvis CT 01/14/2022, CTA chest 12/17/2021, and chest, abdomen and pelvis CT 03/14/2022. FINDINGS: CT HEAD FINDINGS Brain: There is known metastatic disease. Focal subcortical hypodensity is again noted in right superior frontal gyrus and left cerebellar hemisphere, and is unchanged. There are chronic lacunar infarcts in the basal ganglia and thalami, mild cerebral atrophy and small vessel disease. Vascular: There are scattered calcifications in the carotid siphons but no hyperdense central vessels. Skull: No fracture or focal lesion is seen. Sinuses/Orbits: There are small retention cysts and slight membrane thickening again in the right maxillary sinus. Trace fluid in the right mastoid tip. Other sinuses and mastoid air  cells are clear. Nasal septum deviates to the right. Other: None. CT CERVICAL FINDINGS Alignment: There is 2 mm grade 1 degenerative anterolisthesis C3-4 and a slight cervical dextroscoliosis. No new alignment abnormality. Narrowing and osteophytes again noted anterolateral tibial joint. Skull base and vertebrae: There is osteopenia without evidence of fractures or focal bone lesion. Interval anterior plate fusion [id number] solid interbody fusion is noted since the prior study. Soft tissues and spinal canal: No prevertebral fluid or swelling. No visible canal hematoma. There are calcifications in both proximal cervical ICAs. Disc levels: There is mild disc space loss C4-5 and C5-6. The discs are otherwise normal in height. There are mild posterior disc osteophyte complexes at C3-4, C4-5 and C5-6 but no significant encroachment on the thecal sac. There are small anterior endplate spurs CB3-4through C6-7. Multilevel facet joint and uncinate spurring. Resulting degenerative foraminal stenosis is mild on the right at C2-3, severe on the right and moderate to severe on  the left at C3-4, bilaterally moderate to severe C4-5. Other cervical foramina are patent. Other:  None. CT CHEST FINDINGS Cardiovascular: Right IJ port catheter terminates in the right atrium. The cardiac size is normal. There are coronary artery calcifications and CABG changes. There is no pericardial effusion. There is aortic and great vessel arthrosclerosis without aneurysm, stenosis or dissection. The pulmonary arteries and veins are normal in caliber. Mediastinum/Nodes: There are enlarged right paratracheal lymph nodes, largest of which is in the low right paratracheal space measuring 2.8 x 2.5 cm, on the last study measuring 2.5 x 3.3 cm. More superiorly in the right paratracheal chain is stable heterogeneous lymph node measuring 1.3 cm in short axis on 2:10. Slightly prominent subcarinal nodes up to 1 cm in short axis are unchanged. No enlarged  hilar nodes are seen with calcified right hilar lymph nodes again shown. There is no axillary or supraclavicular adenopathy. No thyroid mass is seen. The trachea is clear. Unremarkable thoracic esophagus. Lungs/Pleura: There are innumerable nodular pulmonary metastases. Largest in the left upper lobe is seen posteriorly and fissural based, measuring stable at 1.8 x 1.1 cm on 3:54. Another index nodule is noted in the left lobe measuring 1.1 cm on 3:116 and is also stable. Another index nodule on the right in the lower lobe is 1.1 cm on 3:63 posterior to the right hilum it appears very slightly larger today, previously measuring 9.3 mm. Numerous other nodules are not significantly changed. No increased nodularity is suspected. Small loculated posterior basal right pleural effusion appears similar as well as adjacent streaky atelectasis or consolidation in the right lower lung field. There is no pneumothorax or left pleural fluid. The left main bronchus unremarkable. There is increased thickening and narrowing in the right main bronchus. Musculoskeletal: Irregularity and heterogeneity in the posterior right eleventh and twelfth ribs is again seen as well as a nondisplaced presumably pathologic fracture of the posterior right twelfth rib. Similar irregularity again noted in the posterior right ninth rib. There are degenerative changes of the thoracic spine osteopenia. CT ABDOMEN PELVIS FINDINGS Hepatobiliary: There are scattered tiny too small characterize hypodensities in the liver substance, unchanged without mass enhancement. Gallbladder and bile ducts are unremarkable. Pancreas: No focal abnormality. Spleen: Unremarkable. Adrenals/Urinary Tract: There is no adrenal mass. Stable 2.1 cm left renal cyst not requiring follow-up. There is a 2 mm nonobstructive caliceal stone in the superior pole right kidney and a few subcentimeter too small to characterize hypodensities in the upper right kidney. The bladder is normal  thickness considering the degree of distention. Stomach/Bowel: No dilatation or wall thickening, including the appendix. There are colonic diverticula without evidence of diverticulitis. Vascular/Lymphatic: Moderate to heavy mixed plaque in the aorta and iliac arteries. Enlarging aortocaval lymph node now measures 2.4 x 1.9 cm, previously 1.9 x 1.5 cm. No pelvic adenopathy. Reproductive: No prostatomegaly. Other: There is no free air, free hemorrhage or free fluid. There is no incarcerated hernia. Musculoskeletal: Osteopenia degenerative change lumbar spine. No regional fracture is seen or destructive bone lesion at the levels of the abdomen or pelvis. Again noted is a hypervascular mass in the lateral edge of the dorsal paraspinal musculature on the right, level of L2. There are stranding changes in the right flank subcutaneously which are increased from could be related to local trauma. IMPRESSION: 1. No acute intracranial CT findings or depressed skull fractures. 2. Right frontal lobe and left cerebellar stable hypodensities consistent with known metastases. 3. Osteopenia, degenerative and postsurgical changes of the  cervical spine without evidence of fractures. 4. Subcutaneous stranding in the right flank region which may be traumatic. No other trauma related acute findings in the chest, abdomen or pelvis. 5. In the chest, interval enlargement is seen in the largest right paratracheal lymph node and there is increased thickening and narrowing of the right main bronchus. 6. Innumerable metastatic pulmonary nodules are mostly not significantly changed. One of those described above does measure slightly larger, in the right lower lobe. 7. Stable small loculated right pleural effusion and adjacent streaky atelectasis or infiltrate. 8. Interval enlargement of an aortocaval space node in the abdomen. 9. Irregularity most likely due to metastatic disease in the right posterior ninth, eleventh and twelfth ribs with  pathologic nondisplaced fracture again noted in the posterior right twelfth rib. 10. Hypervascular mass in the lateral edge of the dorsal paraspinal musculature at the level of L2, unchanged. 11. Aortic and coronary artery atherosclerosis. Electronically Signed   By: Telford Nab M.D.   On: 05/04/2022 01:49   CT Cervical Spine Wo Contrast  Result Date: 05/04/2022 CLINICAL DATA:  Frequent falls. On chemotherapy. History of hepatitis-C. Neuroendocrine carcinoma. EXAM: CT HEAD WITHOUT CONTRAST CT CERVICAL SPINE WITHOUT CONTRAST CT CHEST, ABDOMEN AND PELVIS WITH CONTRAST TECHNIQUE: Contiguous axial images were obtained from the base of the skull through the vertex without intravenous contrast. Multidetector CT imaging of the cervical spine was performed without intravenous contrast. Multiplanar CT image reconstructions were also generated. Multidetector CT imaging of the chest, abdomen and pelvis was performed following the standard protocol during bolus administration of intravenous contrast. RADIATION DOSE REDUCTION: This exam was performed according to the departmental dose-optimization program which includes automated exposure control, adjustment of the mA and/or kV according to patient size and/or use of iterative reconstruction technique. CONTRAST:  175m OMNIPAQUE IOHEXOL 300 MG/ML  SOLN COMPARISON:  Head CT 04/20/2022, CTA neck and reconstructions 11/09/2016, abdomen and pelvis CT 01/14/2022, CTA chest 12/17/2021, and chest, abdomen and pelvis CT 03/14/2022. FINDINGS: CT HEAD FINDINGS Brain: There is known metastatic disease. Focal subcortical hypodensity is again noted in right superior frontal gyrus and left cerebellar hemisphere, and is unchanged. There are chronic lacunar infarcts in the basal ganglia and thalami, mild cerebral atrophy and small vessel disease. Vascular: There are scattered calcifications in the carotid siphons but no hyperdense central vessels. Skull: No fracture or focal lesion is  seen. Sinuses/Orbits: There are small retention cysts and slight membrane thickening again in the right maxillary sinus. Trace fluid in the right mastoid tip. Other sinuses and mastoid air cells are clear. Nasal septum deviates to the right. Other: None. CT CERVICAL FINDINGS Alignment: There is 2 mm grade 1 degenerative anterolisthesis C3-4 and a slight cervical dextroscoliosis. No new alignment abnormality. Narrowing and osteophytes again noted anterolateral tibial joint. Skull base and vertebrae: There is osteopenia without evidence of fractures or focal bone lesion. Interval anterior plate fusion [id number] solid interbody fusion is noted since the prior study. Soft tissues and spinal canal: No prevertebral fluid or swelling. No visible canal hematoma. There are calcifications in both proximal cervical ICAs. Disc levels: There is mild disc space loss C4-5 and C5-6. The discs are otherwise normal in height. There are mild posterior disc osteophyte complexes at C3-4, C4-5 and C5-6 but no significant encroachment on the thecal sac. There are small anterior endplate spurs CV5-6through C6-7. Multilevel facet joint and uncinate spurring. Resulting degenerative foraminal stenosis is mild on the right at C2-3, severe on the right and moderate to  severe on the left at C3-4, bilaterally moderate to severe C4-5. Other cervical foramina are patent. Other:  None. CT CHEST FINDINGS Cardiovascular: Right IJ port catheter terminates in the right atrium. The cardiac size is normal. There are coronary artery calcifications and CABG changes. There is no pericardial effusion. There is aortic and great vessel arthrosclerosis without aneurysm, stenosis or dissection. The pulmonary arteries and veins are normal in caliber. Mediastinum/Nodes: There are enlarged right paratracheal lymph nodes, largest of which is in the low right paratracheal space measuring 2.8 x 2.5 cm, on the last study measuring 2.5 x 3.3 cm. More superiorly in the  right paratracheal chain is stable heterogeneous lymph node measuring 1.3 cm in short axis on 2:10. Slightly prominent subcarinal nodes up to 1 cm in short axis are unchanged. No enlarged hilar nodes are seen with calcified right hilar lymph nodes again shown. There is no axillary or supraclavicular adenopathy. No thyroid mass is seen. The trachea is clear. Unremarkable thoracic esophagus. Lungs/Pleura: There are innumerable nodular pulmonary metastases. Largest in the left upper lobe is seen posteriorly and fissural based, measuring stable at 1.8 x 1.1 cm on 3:54. Another index nodule is noted in the left lobe measuring 1.1 cm on 3:116 and is also stable. Another index nodule on the right in the lower lobe is 1.1 cm on 3:63 posterior to the right hilum it appears very slightly larger today, previously measuring 9.3 mm. Numerous other nodules are not significantly changed. No increased nodularity is suspected. Small loculated posterior basal right pleural effusion appears similar as well as adjacent streaky atelectasis or consolidation in the right lower lung field. There is no pneumothorax or left pleural fluid. The left main bronchus unremarkable. There is increased thickening and narrowing in the right main bronchus. Musculoskeletal: Irregularity and heterogeneity in the posterior right eleventh and twelfth ribs is again seen as well as a nondisplaced presumably pathologic fracture of the posterior right twelfth rib. Similar irregularity again noted in the posterior right ninth rib. There are degenerative changes of the thoracic spine osteopenia. CT ABDOMEN PELVIS FINDINGS Hepatobiliary: There are scattered tiny too small characterize hypodensities in the liver substance, unchanged without mass enhancement. Gallbladder and bile ducts are unremarkable. Pancreas: No focal abnormality. Spleen: Unremarkable. Adrenals/Urinary Tract: There is no adrenal mass. Stable 2.1 cm left renal cyst not requiring follow-up. There  is a 2 mm nonobstructive caliceal stone in the superior pole right kidney and a few subcentimeter too small to characterize hypodensities in the upper right kidney. The bladder is normal thickness considering the degree of distention. Stomach/Bowel: No dilatation or wall thickening, including the appendix. There are colonic diverticula without evidence of diverticulitis. Vascular/Lymphatic: Moderate to heavy mixed plaque in the aorta and iliac arteries. Enlarging aortocaval lymph node now measures 2.4 x 1.9 cm, previously 1.9 x 1.5 cm. No pelvic adenopathy. Reproductive: No prostatomegaly. Other: There is no free air, free hemorrhage or free fluid. There is no incarcerated hernia. Musculoskeletal: Osteopenia degenerative change lumbar spine. No regional fracture is seen or destructive bone lesion at the levels of the abdomen or pelvis. Again noted is a hypervascular mass in the lateral edge of the dorsal paraspinal musculature on the right, level of L2. There are stranding changes in the right flank subcutaneously which are increased from could be related to local trauma. IMPRESSION: 1. No acute intracranial CT findings or depressed skull fractures. 2. Right frontal lobe and left cerebellar stable hypodensities consistent with known metastases. 3. Osteopenia, degenerative and postsurgical changes  of the cervical spine without evidence of fractures. 4. Subcutaneous stranding in the right flank region which may be traumatic. No other trauma related acute findings in the chest, abdomen or pelvis. 5. In the chest, interval enlargement is seen in the largest right paratracheal lymph node and there is increased thickening and narrowing of the right main bronchus. 6. Innumerable metastatic pulmonary nodules are mostly not significantly changed. One of those described above does measure slightly larger, in the right lower lobe. 7. Stable small loculated right pleural effusion and adjacent streaky atelectasis or infiltrate.  8. Interval enlargement of an aortocaval space node in the abdomen. 9. Irregularity most likely due to metastatic disease in the right posterior ninth, eleventh and twelfth ribs with pathologic nondisplaced fracture again noted in the posterior right twelfth rib. 10. Hypervascular mass in the lateral edge of the dorsal paraspinal musculature at the level of L2, unchanged. 11. Aortic and coronary artery atherosclerosis. Electronically Signed   By: Telford Nab M.D.   On: 05/04/2022 01:49   CT CHEST ABDOMEN PELVIS W CONTRAST  Result Date: 05/04/2022 CLINICAL DATA:  Frequent falls. On chemotherapy. History of hepatitis-C. Neuroendocrine carcinoma. EXAM: CT HEAD WITHOUT CONTRAST CT CERVICAL SPINE WITHOUT CONTRAST CT CHEST, ABDOMEN AND PELVIS WITH CONTRAST TECHNIQUE: Contiguous axial images were obtained from the base of the skull through the vertex without intravenous contrast. Multidetector CT imaging of the cervical spine was performed without intravenous contrast. Multiplanar CT image reconstructions were also generated. Multidetector CT imaging of the chest, abdomen and pelvis was performed following the standard protocol during bolus administration of intravenous contrast. RADIATION DOSE REDUCTION: This exam was performed according to the departmental dose-optimization program which includes automated exposure control, adjustment of the mA and/or kV according to patient size and/or use of iterative reconstruction technique. CONTRAST:  171m OMNIPAQUE IOHEXOL 300 MG/ML  SOLN COMPARISON:  Head CT 04/20/2022, CTA neck and reconstructions 11/09/2016, abdomen and pelvis CT 01/14/2022, CTA chest 12/17/2021, and chest, abdomen and pelvis CT 03/14/2022. FINDINGS: CT HEAD FINDINGS Brain: There is known metastatic disease. Focal subcortical hypodensity is again noted in right superior frontal gyrus and left cerebellar hemisphere, and is unchanged. There are chronic lacunar infarcts in the basal ganglia and thalami, mild  cerebral atrophy and small vessel disease. Vascular: There are scattered calcifications in the carotid siphons but no hyperdense central vessels. Skull: No fracture or focal lesion is seen. Sinuses/Orbits: There are small retention cysts and slight membrane thickening again in the right maxillary sinus. Trace fluid in the right mastoid tip. Other sinuses and mastoid air cells are clear. Nasal septum deviates to the right. Other: None. CT CERVICAL FINDINGS Alignment: There is 2 mm grade 1 degenerative anterolisthesis C3-4 and a slight cervical dextroscoliosis. No new alignment abnormality. Narrowing and osteophytes again noted anterolateral tibial joint. Skull base and vertebrae: There is osteopenia without evidence of fractures or focal bone lesion. Interval anterior plate fusion [id number] solid interbody fusion is noted since the prior study. Soft tissues and spinal canal: No prevertebral fluid or swelling. No visible canal hematoma. There are calcifications in both proximal cervical ICAs. Disc levels: There is mild disc space loss C4-5 and C5-6. The discs are otherwise normal in height. There are mild posterior disc osteophyte complexes at C3-4, C4-5 and C5-6 but no significant encroachment on the thecal sac. There are small anterior endplate spurs CN8-2through C6-7. Multilevel facet joint and uncinate spurring. Resulting degenerative foraminal stenosis is mild on the right at C2-3, severe on the right  and moderate to severe on the left at C3-4, bilaterally moderate to severe C4-5. Other cervical foramina are patent. Other:  None. CT CHEST FINDINGS Cardiovascular: Right IJ port catheter terminates in the right atrium. The cardiac size is normal. There are coronary artery calcifications and CABG changes. There is no pericardial effusion. There is aortic and great vessel arthrosclerosis without aneurysm, stenosis or dissection. The pulmonary arteries and veins are normal in caliber. Mediastinum/Nodes: There are  enlarged right paratracheal lymph nodes, largest of which is in the low right paratracheal space measuring 2.8 x 2.5 cm, on the last study measuring 2.5 x 3.3 cm. More superiorly in the right paratracheal chain is stable heterogeneous lymph node measuring 1.3 cm in short axis on 2:10. Slightly prominent subcarinal nodes up to 1 cm in short axis are unchanged. No enlarged hilar nodes are seen with calcified right hilar lymph nodes again shown. There is no axillary or supraclavicular adenopathy. No thyroid mass is seen. The trachea is clear. Unremarkable thoracic esophagus. Lungs/Pleura: There are innumerable nodular pulmonary metastases. Largest in the left upper lobe is seen posteriorly and fissural based, measuring stable at 1.8 x 1.1 cm on 3:54. Another index nodule is noted in the left lobe measuring 1.1 cm on 3:116 and is also stable. Another index nodule on the right in the lower lobe is 1.1 cm on 3:63 posterior to the right hilum it appears very slightly larger today, previously measuring 9.3 mm. Numerous other nodules are not significantly changed. No increased nodularity is suspected. Small loculated posterior basal right pleural effusion appears similar as well as adjacent streaky atelectasis or consolidation in the right lower lung field. There is no pneumothorax or left pleural fluid. The left main bronchus unremarkable. There is increased thickening and narrowing in the right main bronchus. Musculoskeletal: Irregularity and heterogeneity in the posterior right eleventh and twelfth ribs is again seen as well as a nondisplaced presumably pathologic fracture of the posterior right twelfth rib. Similar irregularity again noted in the posterior right ninth rib. There are degenerative changes of the thoracic spine osteopenia. CT ABDOMEN PELVIS FINDINGS Hepatobiliary: There are scattered tiny too small characterize hypodensities in the liver substance, unchanged without mass enhancement. Gallbladder and bile  ducts are unremarkable. Pancreas: No focal abnormality. Spleen: Unremarkable. Adrenals/Urinary Tract: There is no adrenal mass. Stable 2.1 cm left renal cyst not requiring follow-up. There is a 2 mm nonobstructive caliceal stone in the superior pole right kidney and a few subcentimeter too small to characterize hypodensities in the upper right kidney. The bladder is normal thickness considering the degree of distention. Stomach/Bowel: No dilatation or wall thickening, including the appendix. There are colonic diverticula without evidence of diverticulitis. Vascular/Lymphatic: Moderate to heavy mixed plaque in the aorta and iliac arteries. Enlarging aortocaval lymph node now measures 2.4 x 1.9 cm, previously 1.9 x 1.5 cm. No pelvic adenopathy. Reproductive: No prostatomegaly. Other: There is no free air, free hemorrhage or free fluid. There is no incarcerated hernia. Musculoskeletal: Osteopenia degenerative change lumbar spine. No regional fracture is seen or destructive bone lesion at the levels of the abdomen or pelvis. Again noted is a hypervascular mass in the lateral edge of the dorsal paraspinal musculature on the right, level of L2. There are stranding changes in the right flank subcutaneously which are increased from could be related to local trauma. IMPRESSION: 1. No acute intracranial CT findings or depressed skull fractures. 2. Right frontal lobe and left cerebellar stable hypodensities consistent with known metastases. 3. Osteopenia, degenerative  and postsurgical changes of the cervical spine without evidence of fractures. 4. Subcutaneous stranding in the right flank region which may be traumatic. No other trauma related acute findings in the chest, abdomen or pelvis. 5. In the chest, interval enlargement is seen in the largest right paratracheal lymph node and there is increased thickening and narrowing of the right main bronchus. 6. Innumerable metastatic pulmonary nodules are mostly not significantly  changed. One of those described above does measure slightly larger, in the right lower lobe. 7. Stable small loculated right pleural effusion and adjacent streaky atelectasis or infiltrate. 8. Interval enlargement of an aortocaval space node in the abdomen. 9. Irregularity most likely due to metastatic disease in the right posterior ninth, eleventh and twelfth ribs with pathologic nondisplaced fracture again noted in the posterior right twelfth rib. 10. Hypervascular mass in the lateral edge of the dorsal paraspinal musculature at the level of L2, unchanged. 11. Aortic and coronary artery atherosclerosis. Electronically Signed   By: Telford Nab M.D.   On: 05/04/2022 01:49   DG Chest Port 1 View  Result Date: 05/04/2022 CLINICAL DATA:  Frequent falls, weakness, on chemo followed by [facility]. EXAM: DG HIP (WITH OR WITHOUT PELVIS) 2-3V RIGHT; PORTABLE CHEST - 1 VIEW COMPARISON:  Portable chest 04/20/2022, chest CT 03/14/2022, CT abdomen and pelvis and reconstructions 01/14/2022. FINDINGS: Chest: There are innumerable bilateral pulmonary nodules consistent with metastases. Small right pleural effusion is unchanged. No focal pneumonia is evident. Heart size and vascular pattern normal with CABG changes and right chest IJ port catheter again terminating in the right atrium. There is aortic atherosclerosis with stable mediastinum. There is osteopenia mild thoracic levoscoliosis and spondylosis. AP pelvis and AP and frog-leg right hip: There is no evidence of hip fracture or dislocation. There is no evidence of arthropathy or other focal bone abnormality. There are scattered calcifications in the right femoral artery. IMPRESSION: 1. Stable radiographic chest findings with innumerable nodular lung metastases. No acute abnormality. 2. Small right pleural effusion, unchanged. 3. No AP pelvic fracture or diastasis is seen. No right hip fracture is seen. Electronically Signed   By: Telford Nab M.D.   On:  05/04/2022 00:49   DG Hip Unilat W or Wo Pelvis 2-3 Views Right  Result Date: 05/04/2022 CLINICAL DATA:  Frequent falls, weakness, on chemo followed by [facility]. EXAM: DG HIP (WITH OR WITHOUT PELVIS) 2-3V RIGHT; PORTABLE CHEST - 1 VIEW COMPARISON:  Portable chest 04/20/2022, chest CT 03/14/2022, CT abdomen and pelvis and reconstructions 01/14/2022. FINDINGS: Chest: There are innumerable bilateral pulmonary nodules consistent with metastases. Small right pleural effusion is unchanged. No focal pneumonia is evident. Heart size and vascular pattern normal with CABG changes and right chest IJ port catheter again terminating in the right atrium. There is aortic atherosclerosis with stable mediastinum. There is osteopenia mild thoracic levoscoliosis and spondylosis. AP pelvis and AP and frog-leg right hip: There is no evidence of hip fracture or dislocation. There is no evidence of arthropathy or other focal bone abnormality. There are scattered calcifications in the right femoral artery. IMPRESSION: 1. Stable radiographic chest findings with innumerable nodular lung metastases. No acute abnormality. 2. Small right pleural effusion, unchanged. 3. No AP pelvic fracture or diastasis is seen. No right hip fracture is seen. Electronically Signed   By: Telford Nab M.D.   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  - Exam reveals *** - CT head: Right frontal lobe and left cerebellar stable hypodensities consistent with known metastases.  - CT chest: Innumerable metastatic pulmonary nodules are mostly not significantly changed.  Interval enlargement is seen in the largest right paratracheal lymph node and there is increased thickening and narrowing of the right main bronchus.One of those described does measure slightly larger, in the right lower lobe. Irregularity most likely due to metastatic disease in the right posterior ninth, eleventh and twelfth ribs with pathologic nondisplaced fracture again noted in the posterior right twelfth rib. Hypervascular mass in the lateral edge of the dorsal paraspinal musculature at the level of L2, unchanged. Osteopenia, degenerative and postsurgical changes of the cervical spine without evidence of fractures. - ***  Recommendations: -    Electronically signed: Dr. Kerney Elbe 05/04/2022, 7:21 PM     66 y.o. male with a PMHx of atrial fibrillation (on Xarelto), anemia, basal cell carcinoma, CAD s/p CABG, carotid artery disease, ectopic atrial tachycardia, hepatitis C (treated and cured per Epic), hypercholesterolemia, renal stones, HTN, ischemic cardiomyopathy, metastatic large cell neuroendocrine carcinoma, s/p whole-brain irradiation for brain metastases one month ago, migraines, small stroke in 2018, symptomatic hypotension and syncope who initially presented to the [facility] ED on Monday night for evaluation of progressive weakness over the last several weeks. He is currently undergoing a clinical trial for lung cancer at [facility] and received his second round of chemotherapy. Over the last several weeks he has been experiencing multiple falls. These have occurred approximately 10-12 times. On the day of presentation, he had a fall in the morning and family was able to assist him to the ground.  Family member stated that after these falls the patient seems very shaky and has a brief, blank stare. After these episodes he comes to but is confused for 1 to 2 minutes. The patient has had complaints of poor focus and poor appetite.  He also has had progressive right-sided back pain that radiates to the  right upper quadrant and chest. This pain has been constant since January but he feels like it has significantly worsened since his falls. He states he is no longer able to text due to difficulty with tremor and coordination with the left hand. Initial exam by EDP revealed a frail appearing patient with some mild right lower extremity proximal weakness that seemed to be secondary to pain, as well as a tremor on intention in the left upper extremity. He was orthostatic and continued to be so after a fluid bolus. CBG was 87.

## 2022-05-04 NOTE — ED Notes (Signed)
Verbal consent provided by patient, okay to transfer to [facility]. Unable to sign d/t signature pad not available.

## 2022-05-04 NOTE — H&P (Signed)
History and Physical    Patient: [name] RXV:[id number] DOB: 1956/07/06 DOA: 05/03/2022 DOS: the patient was seen and examined on 05/04/2022 PCP: [name], MD  Patient coming from: Home  Chief Complaint:  Chief Complaint  Patient presents with   Weakness   HPI: [name] is a 66 y.o. 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 Presenting with falls. History is from daughter by phone. She reports for the last several weeks he falls when he tries to get up and get mobile. He has tremors prior to many of these falls. He has been seen "spacing out" as well. Some times he will be responsive to verbal command, but he is confused as to what happened during these events. When his symptoms did not improve yesterday, family decided to bring him to the ED for evaluation. He denies any other aggravating or alleviating factors.    Review of Systems: As mentioned in the history of present illness. All other systems reviewed and are negative. Past Medical History:  Diagnosis Date   Anemia    Anxiety    Arthritis    Basal cell carcinoma (BCC) of forehead    CAD (coronary artery disease)    a. 10/2015 ant STEMI >> LHC with 3 v CAD; oLAD tx with POBA >> emergent CABG. b. Multiple evals since that time, early graft failure of SVG-RCA by cath 03/2016. c. 2/19 PCI/DES x1 to pRCA, normal EF.   Carotid artery disease ([place])    a. 40-59% BICA 02/2018.   Depression    Dyspnea    Ectopic atrial tachycardia (HCC)    Esophageal reflux    eosinophil esophagitis   Family history of adverse reaction to anesthesia    "sister has PONV" (06/21/2017)   Former tobacco use    Gout    Hepatitis C    "treated and cured" (06/21/2017)   High cholesterol    History of blood transfusion    History of kidney stones    Hypertension    Ischemic cardiomyopathy    a.  EF 25-30% at intraop TEE 4/17  //  b. Limited Echo 5/17 - EF 45-50%, mild ant HK. c. EF 55-65% by cath 09/2017.   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 HR dropping into 40s in 02/2016 -> BB reduced.   Stroke [facility]) 10/2016   "small one; sometimes my memory/cognitive issues" (06/21/2017)   Symptomatic hypotension    a. 02/2016 ER visit -> meds reduced.   Syncope    Wears dentures    Wears glasses    Past Surgical History:  Procedure Laterality Date   25 GAUGE PARS PLANA VITRECTOMY WITH 20 GAUGE MVR PORT  12/31/2020   Procedure: 25 GAUGE PARS PLANA VITRECTOMY WITH 20 GAUGE MVR PORT;  Surgeon: [name], MD;  Location: [place];  Service: Thoracic;;   ANTERIOR CERVICAL DECOMP/DISCECTOMY FUSION N/A 10/17/2018   Procedure: Anterior Cervical Decompression Fusion - Cervical seven -Thoracic one;  Surgeon: Consuella Lose, MD;  Location: [place];  Service: Neurosurgery;  Laterality: N/A;   BASAL CELL CARCINOMA EXCISION     "forehead   BIOPSY  07/20/2019   Procedure: BIOPSY;  Surgeon: Carol Ada, MD;  Location:  [facility];  Service: Endoscopy;;   BIOPSY OF MEDIASTINAL MASS Right 07/29/2021   Procedure: RIGHT CHEST WALL MASS BIOPSY;  Surgeon: [name], MD;  Location: [place];  Service: Thoracic;  Laterality: Right;   BRONCHIAL BIOPSY  12/24/2020   Procedure: BRONCHIAL BIOPSIES;  Surgeon: Garner Nash, DO;  Location: [place] [facility];  Service: Pulmonary;;   BRONCHIAL BRUSHINGS  12/24/2020   Procedure: BRONCHIAL BRUSHINGS;  Surgeon: Garner Nash, DO;  Location: [place] [facility];  Service: Pulmonary;;   BRONCHIAL NEEDLE ASPIRATION BIOPSY  12/24/2020   Procedure: BRONCHIAL NEEDLE ASPIRATION BIOPSIES;  Surgeon: Garner Nash, DO;  Location: [place];  Service: Pulmonary;;   CARDIAC CATHETERIZATION N/A 11/28/2015   Procedure: Left Heart Cath and  Coronary Angiography;  Surgeon: [name], MD;  Location: [place] [facility];  Service: Cardiovascular;  Laterality: N/A;   CARDIAC CATHETERIZATION N/A 11/28/2015   Procedure: Coronary Balloon Angioplasty;  Surgeon: [name], MD;  Location: [place] [facility];  Service: Cardiovascular;  Laterality: N/A;  ostial LAD   CARDIAC CATHETERIZATION N/A 11/28/2015   Procedure: Coronary/Graft Angiography;  Surgeon: [name], MD;  Location: [place] [facility];  Service: Cardiovascular;  Laterality: N/A;  coronaries only    CARDIAC CATHETERIZATION N/A 04/21/2016   Procedure: Left Heart Cath and Coronary Angiography;  Surgeon: Wellington Hampshire, MD;  Location: [place] [facility];  Service: Cardiovascular;  Laterality: N/A;   CARDIAC CATHETERIZATION N/A 06/14/2016   Procedure: Left Heart Cath and Cors/Grafts Angiography;  Surgeon: Lorretta Harp, MD;  Location: [place] [facility];  Service: Cardiovascular;  Laterality: N/A;   CARDIAC CATHETERIZATION N/A 09/08/2016   Procedure: Left Heart Cath and Cors/Grafts Angiography;  Surgeon: Wellington Hampshire, MD;  Location: [place] [facility];  Service: Cardiovascular;  Laterality: N/A;   CARDIAC CATHETERIZATION     CORONARY ARTERY BYPASS GRAFT N/A 11/28/2015   Procedure: CORONARY ARTERY BYPASS GRAFTING (CABG) TIMES FIVE USING LEFT INTERNAL MAMMARY ARTERY AND RIGHT GREATER SAPHENOUS,VIEN HARVEATED BY ENDOVIEN, INTRAOPPRATIVE TEE;  Surgeon: Gaye Pollack, MD;  Location: [place];  Service: Open Heart Surgery;  Laterality: N/A;   CORONARY STENT INTERVENTION N/A 10/05/2017   Procedure: CORONARY STENT INTERVENTION;  Surgeon: [name], MD;  Location: [place] [facility];  Service: Cardiovascular;  Laterality: N/A;   ESOPHAGOGASTRODUODENOSCOPY (EGD) WITH PROPOFOL N/A 07/20/2019   Procedure: ESOPHAGOGASTRODUODENOSCOPY (EGD) WITH PROPOFOL;  Surgeon: Carol Ada, MD;  [facility];  Service: Endoscopy;  Laterality: N/A;    ESOPHAGOGASTRODUODENOSCOPY (EGD) WITH PROPOFOL N/A 01/30/2021   Procedure: ESOPHAGOGASTRODUODENOSCOPY (EGD) WITH PROPOFOL;  Surgeon: Carol Ada, MD;  [facility];  Service: Endoscopy;  Laterality: N/A;   FIDUCIAL MARKER PLACEMENT  12/24/2020   Procedure: FIDUCIAL DYE MARKER PLACEMENT;  Surgeon: Garner Nash, DO;  Location: [place] [facility];  Service: Pulmonary;;   HUMERUS SURGERY Right 1969   "tumor inside bone; filled it w/bone chips"   INTERCOSTAL NERVE BLOCK Right 12/24/2020   Procedure: INTERCOSTAL NERVE BLOCK;  Surgeon: [name], MD;  Location: [place];  Service: Thoracic;  Laterality: Right;   IR IMAGING GUIDED PORT INSERTION  02/06/2021   LEFT HEART CATH AND CORS/GRAFTS ANGIOGRAPHY N/A 03/11/2017   Procedure: Left Heart Cath and Cors/Grafts Angiography;  Surgeon: Leonie Man, MD;  Location: [place] [facility];  Service: Cardiovascular;  Laterality: N/A;   LEFT HEART CATH AND CORS/GRAFTS ANGIOGRAPHY N/A 10/05/2017   Procedure: LEFT HEART CATH AND CORS/GRAFTS ANGIOGRAPHY;  Surgeon: Larae Grooms  S, MD;  Location: [place] [facility];  Service: Cardiovascular;  Laterality: N/A;   LEFT HEART CATH AND CORS/GRAFTS ANGIOGRAPHY N/A 04/11/2019   Procedure: LEFT HEART CATH AND CORS/GRAFTS ANGIOGRAPHY;  Surgeon: [name], MD;  Location: [place] [facility];  Service: Cardiovascular;  Laterality: N/A;   NODE DISSECTION Right 12/24/2020   Procedure: NODE DISSECTION;  Surgeon: [name], MD;  Location: [place];  Service: Thoracic;  Laterality: Right;   PERIPHERAL VASCULAR CATHETERIZATION N/A 06/14/2016   Procedure: Lower Extremity Angiography;  Surgeon: Lorretta Harp, MD;  Location: [place] [facility];  Service: Cardiovascular;  Laterality: N/A;   VIDEO BRONCHOSCOPY WITH ENDOBRONCHIAL NAVIGATION Right 12/24/2020   Procedure: VIDEO BRONCHOSCOPY WITH ENDOBRONCHIAL NAVIGATION;  Surgeon: Garner Nash, DO;  Location: [place];  Service: Pulmonary;   Laterality: Right;   VIDEO BRONCHOSCOPY WITH ENDOBRONCHIAL ULTRASOUND N/A 12/24/2020   Procedure: VIDEO BRONCHOSCOPY WITH ENDOBRONCHIAL ULTRASOUND;  Surgeon: Garner Nash, DO;  Location: [place];  Service: Pulmonary;  Laterality: N/A;   VIDEO BRONCHOSCOPY WITH INSERTION OF INTERBRONCHIAL VALVE (IBV) N/A 12/31/2020   Procedure: VIDEO BRONCHOSCOPY WITH INSERTION OF INTERBRONCHIAL VALVE (IBV).VALVE IN CARTRIDGE 56mm,9mm. CHEST TUBE PLACEMENT.;  Surgeon: [name], MD;  [facility];  Service: Thoracic;  Laterality: N/A;   VIDEO BRONCHOSCOPY WITH INSERTION OF INTERBRONCHIAL VALVE (IBV) N/A 07/29/2021   Procedure: VIDEO BRONCHOSCOPY WITH REMOVAL OF INTERBRONCHIAL VALVE (IBV);  Surgeon: [name], MD;  Location: [place];  Service: Thoracic;  Laterality: N/A;   Social History:  reports that he quit smoking about 6 years ago. His smoking use included cigarettes. He has a 33.00 pack-year smoking history. He has never used smokeless tobacco. He reports that he does not currently use alcohol. He reports that he does not use drugs.  Allergies  Allergen Reactions   Prednisone Other (See Comments)    States that this med makes him "crazy"   Tetanus Toxoids Swelling and Other (See Comments)    Fever, Swelling of the arm    Wellbutrin [Bupropion] Other (See Comments)    Crazy thoughts, nightmares   Varenicline Other (See Comments)    Unpleasant dreams    Family History  Problem Relation Age of Onset   Lung cancer Mother    Heart Problems Father    Heart attack Father 25   Stroke Father    Heart failure Father    Heart attack Maternal Grandmother    Stroke Maternal Grandmother    Heart attack Paternal Uncle    Hypertension Brother    Autoimmune disease Neg Hx     Prior to Admission medications   Medication Sig Start Date End Date Taking? Authorizing Provider  acetaminophen (TYLENOL) 500 MG tablet Take 1,000 mg by mouth every 6 (six) hours as needed for mild pain,  fever or headache.    [provider]  benzonatate (TESSALON) 100 MG capsule Take 1 capsule (100 mg total) by mouth 3 (three) times daily as needed for cough. TAKE 1 CAPSULE(100 MG) BY MOUTH THREE TIMES DAILY AS NEEDED FOR COUGH Strength: 100 mg 03/23/22   Heilingoetter, Cassandra L, PA-C  bisacodyl (DULCOLAX) 5 MG EC tablet Take 5 mg by mouth daily as needed (constipation).    [provider]  dexamethasone (DECADRON) 4 MG tablet Take 1 tablet (4 mg total) by mouth daily. 03/05/22   Kyung Rudd, MD  docusate sodium (STOOL SOFTENER) 100 MG capsule Take 100 mg by mouth 2 (two) times daily as needed (constipation).  [provider]  DULoxetine (CYMBALTA) 30 MG capsule Take 1 capsule by mouth 2 times daily. 03/29/22   Pickenpack-Cousar, Carlena Sax, NP  HYDROmorphone (DILAUDID) 8 MG tablet Take 1 tablet by mouth every 6 hours as needed for severe pain. 04/13/22   Pickenpack-Cousar, Carlena Sax, NP  lidocaine-prilocaine (EMLA) cream Apply to the Port-A-Cath site 30-60-minute before treatment Patient taking differently: Apply 1 Application topically once as needed (prior to port access - 30-60 minutes before treatment). 11/09/21   Curt Bears, MD  LORazepam (ATIVAN) 1 MG tablet Take 1 tablet (1 mg total) by mouth every 8 (eight) hours as needed for anxiety. 03/29/22   Pickenpack-Cousar, Carlena Sax, NP  metoprolol succinate (TOPROL-XL) 25 MG 24 hr tablet Take 0.5 tablets (12.5 mg total) by mouth daily. 03/09/22   Elgie Collard, PA-C  morphine (MS CONTIN) 60 MG 12 hr tablet Take 1 tablet by mouth every 12  hours. 04/19/22   Pickenpack-Cousar, Carlena Sax, NP  ondansetron (ZOFRAN) 8 MG tablet Take 1 tablet by mouth every 8 hours as needed for nausea or vomiting. Starting 3 days after chemotherapy 01/11/22   Pickenpack-Cousar, Carlena Sax, NP  tiZANidine (ZANAFLEX) 2 MG tablet Take 1 tablet (2 mg total) by mouth at bedtime as needed for muscle spasms. 02/02/22   Pickenpack-Cousar, Carlena Sax, NP  XARELTO  20 MG TABS tablet Take 1 tablet (20 mg total) by mouth every evening. 04/14/22   [name], MD    Physical Exam: Vitals:   05/04/22 0850 05/04/22 1200 05/04/22 1346 05/04/22 1458  BP: 130/86 108/71  126/77  Pulse: 79 85 83 70  Resp: 18 19 14 16   Temp: 98.4 F (36.9 C)  98 F (36.7 C) 98.1 F (36.7 C)  TempSrc: Oral   Oral  SpO2: 100% 97% 99% 100%  Weight:      Height:       General: 67 y.o. male resting in bed in NAD Eyes: PERRL, normal sclera ENMT: Nares patent w/o discharge, orophaynx clear, dentition normal, ears w/o discharge/lesions/ulcers Neck: Supple, trachea midline Cardiovascular: RRR, +S1, S2, no m/g/r, equal pulses throughout Respiratory: CTABL, no w/r/r, normal WOB GI: BS+, NDNT, no masses noted, no organomegaly noted MSK: No e/c/c Neuro: A&O x 3, some intention tremor in LUE, msk str 5/5 BUE/BLE although pain in right hip is limiting RLE testing Psyc: Appropriate interaction and affect, calm/cooperative   Data Reviewed:  Lab Results  Component Value Date   NA 131 (L) 05/03/2022   K 3.7 05/03/2022   CO2 24 05/03/2022   GLUCOSE 95 05/03/2022   BUN 16 05/03/2022   CREATININE 0.91 05/03/2022   CALCIUM 9.0 05/03/2022   GFRNONAA >60 05/03/2022   Lab Results  Component Value Date   WBC 5.9 05/03/2022   HGB 10.0 (L) 05/03/2022   HCT 30.7 (L) 05/03/2022   MCV 92.5 05/03/2022   PLT 202 05/03/2022   CXR: 1. Stable radiographic chest findings with innumerable nodular lung metastases. No acute abnormality. 2. Small right pleural effusion, unchanged. 3. No AP pelvic fracture or diastasis is seen. No right hip fracture is seen.  CTH CT C-spine CT chest/ab/pelvis 1. No acute intracranial CT findings or depressed skull fractures. 2. Right frontal lobe and left cerebellar stable hypodensities consistent with known metastases. 3. Osteopenia, degenerative and postsurgical changes of the cervical spine without evidence of fractures. 4. Subcutaneous  stranding in the right flank region which may be traumatic. No other trauma related acute findings in the chest, abdomen or  pelvis. 5. In the chest, interval enlargement is seen in the largest right paratracheal lymph node and there is increased thickening and narrowing of the right main bronchus. 6. Innumerable metastatic pulmonary nodules are mostly not significantly changed. One of those described above does measure slightly larger, in the right lower lobe. 7. Stable small loculated right pleural effusion and adjacent streaky atelectasis or infiltrate. 8. Interval enlargement of an aortocaval space node in the abdomen. 9. Irregularity most likely due to metastatic disease in the right posterior ninth, eleventh and twelfth ribs with pathologic nondisplaced fracture again noted in the posterior right twelfth rib. 10. Hypervascular mass in the lateral edge of the dorsal paraspinal musculature at the level of L2, unchanged. 11. Aortic and coronary artery atherosclerosis.  Assessment and Plan: Falls Spells     - admit to inpt, tele     - PT/OT eval     - CT as above     - EDP spoke with Dr. Cheral Marker last night; recommended admission for EEG; 1-hr EEG ordered     - Neuro notified of patient's arrival to [facility]; Dr. Curly Shores confirmed she will review his case; awaiting further recs     - no signs of infection     - seems a little dry; check orthostatics and give fluids  S4 neuroendocrine carcinoma     - mets to brain, ribs, lung, back     - s/p whole brain radiation; now on experimental chemo     - continue follow up w/ [facility]  PAF CAD s/p CABG     - continue home regimen  Normocytic anemia     - no evidence of bleed, follow  Hyponatremia     - mild, fluids, follow  Advance Care Planning:  Code Status: DNR  Consults: Neurology  Family Communication: w/ daughter by phone  Severity of Illness: The appropriate patient status for this patient is INPATIENT. Inpatient status is  judged to be reasonable and necessary in order to provide the required intensity of service to ensure the patient's safety. The patient's presenting symptoms, physical exam findings, and initial radiographic and laboratory data in the context of their chronic comorbidities is felt to place them at high risk for further clinical deterioration. Furthermore, it is not anticipated that the patient will be medically stable for discharge from the hospital within 2 midnights of admission.   * I certify that at the point of admission it is my clinical judgment that the patient will require inpatient hospital care spanning beyond 2 midnights from the point of admission due to high intensity of service, high risk for further deterioration and high frequency of surveillance required.*  Time spent in coordination of this H&P: 60 minutes  Author: Jonnie Finner, DO 05/04/2022 3:33 PM  For on call review [email or web address].

## 2022-05-04 NOTE — Telephone Encounter (Signed)
This encounter was created in error - please disregard.

## 2022-05-04 NOTE — ED Notes (Signed)
Pt unable to complete orthostatic vitals due to dizziness

## 2022-05-05 DIAGNOSIS — I25709 Atherosclerosis of coronary artery bypass graft(s), unspecified, with unspecified angina pectoris: Secondary | ICD-10-CM

## 2022-05-05 DIAGNOSIS — I480 Paroxysmal atrial fibrillation: Secondary | ICD-10-CM | POA: Diagnosis not present

## 2022-05-05 DIAGNOSIS — W19XXXD Unspecified fall, subsequent encounter: Secondary | ICD-10-CM | POA: Diagnosis not present

## 2022-05-05 DIAGNOSIS — R55 Syncope and collapse: Secondary | ICD-10-CM | POA: Diagnosis not present

## 2022-05-05 DIAGNOSIS — R531 Weakness: Secondary | ICD-10-CM | POA: Diagnosis not present

## 2022-05-05 DIAGNOSIS — E871 Hypo-osmolality and hyponatremia: Secondary | ICD-10-CM

## 2022-05-05 LAB — COMPREHENSIVE METABOLIC PANEL
ALT: 13 U/L (ref 0–44)
AST: 19 U/L (ref 15–41)
Albumin: 2.9 g/dL — ABNORMAL LOW (ref 3.5–5.0)
Alkaline Phosphatase: 88 U/L (ref 38–126)
Anion gap: 7 (ref 5–15)
BUN: 11 mg/dL (ref 8–23)
CO2: 24 mmol/L (ref 22–32)
Calcium: 9 mg/dL (ref 8.9–10.3)
Chloride: 107 mmol/L (ref 98–111)
Creatinine, Ser: 0.7 mg/dL (ref 0.61–1.24)
GFR, Estimated: >60 mL/min (ref >60)
Glucose, Bld: 92 mg/dL (ref 70–99)
Potassium: 3.8 mmol/L (ref 3.5–5.1)
Sodium: 138 mmol/L (ref 135–145)
Total Bilirubin: 0.8 mg/dL (ref 0.3–1.2)
Total Protein: 6.9 g/dL (ref 6.5–8.1)

## 2022-05-05 LAB — CBC
HCT: 31.2 % — ABNORMAL LOW (ref 39.0–52.0)
Hemoglobin: 10 g/dL — ABNORMAL LOW (ref 13.0–17.0)
MCH: 30.4 pg (ref 26.0–34.0)
MCHC: 32.1 g/dL (ref 30.0–36.0)
MCV: 94.8 fL (ref 80.0–100.0)
Platelets: 193 10*3/uL (ref 150–400)
RBC: 3.29 MIL/uL — ABNORMAL LOW (ref 4.22–5.81)
RDW: 14 % (ref 11.5–15.5)
WBC: 4.9 10*3/uL (ref 4.0–10.5)
nRBC: 0 % (ref 0.0–0.2)

## 2022-05-05 LAB — HIV ANTIBODY (ROUTINE TESTING W REFLEX): HIV Screen 4th Generation wRfx: NONREACTIVE

## 2022-05-05 MED ORDER — MORPHINE SULFATE ER 15 MG PO TBCR
60.0000 mg | EXTENDED_RELEASE_TABLET | Freq: Two times a day (BID) | ORAL | Status: DC
  Administered 2022-05-05 – 2022-05-13 (×17): 60 mg via ORAL
  Filled 2022-05-05 (×18): qty 4

## 2022-05-05 MED ORDER — HYDROMORPHONE HCL 1 MG/ML IJ SOLN
1.0000 mg | INTRAMUSCULAR | Status: DC | PRN
  Administered 2022-05-05 – 2022-05-09 (×16): 1 mg via INTRAVENOUS
  Filled 2022-05-05 (×16): qty 1

## 2022-05-05 NOTE — TOC Progression Note (Signed)
Transition of Care [facility]) - Progression Note    Patient Details  Name: [name] MRN: [id number] Date of Birth: 09-Sep-1955  [facility] CM/SW Contact  Leeroy Cha, RN Phone Number: 05/05/2022, 9:19 AM  Clinical Narrative:    Spoke with the daughter.  Wants to know what the next steps are.  Pt is to have a pt and ot evaluation today and then we will know at what level of adls and ambulation he is at.   Expected Discharge Plan: Home/Self Care Barriers to Discharge: Continued Medical Work up  Expected Discharge Plan and Services Expected Discharge Plan: Home/Self Care   Discharge Planning Services: CM Consult   Living arrangements for the past 2 months: Single Family Home                                       Social Determinants of Health (SDOH) Interventions    Readmission Risk Interventions   Row Labels 03/17/2022   12:19 PM 08/21/2021    9:25 AM 08/01/2021    8:41 AM  Readmission Risk Prevention Plan   Section Header. No data exists in this row.     Transportation Screening   Complete Complete Complete  Medication Review [employer])   Complete Complete Complete  PCP or Specialist appointment within 3-5 days of discharge   Complete Complete Complete  HRI or Home Care Consult   Complete Complete Complete  SW Recovery Care/Counseling Consult   Complete Complete Complete  SW Consult Not Complete Comments    n/a   Palliative Care Screening   Complete Not Applicable Complete  Skilled [place]   Not Applicable Not Applicable Not Applicable

## 2022-05-05 NOTE — Progress Notes (Signed)
PROGRESS NOTE    [name]  GOT:[id number] DOB: [birth year] DOA: 05/03/2022 PCP: [name], MD    Chief Complaint  Patient presents with   Weakness    Brief Narrative:   [name] is a 66 y.o. 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 Presenting with falls.   Patient underwent CT Head, Chest, abdomen and pelvis along with cervical spine and results reviewed with the patient.  Neurology consulted, recommended getting EEG, MRI c and thoracic spine.    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 EEG shows mild diffuse encephalopathy,  No seizures or epileptiform discharges were seen throughout the recording. Neurology recommending MRI of the C-spine and thoracic spine to evaluate for myelopathy.     Orthostatic hypotension -Persistent with some symptoms today were not working with PT -Gentle hydration, compression stockings up to the thigh, abdominal binder and TED hoses.    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 Recommend outpatient follow-up with oncology at [facility].   PAF  Rate controlled.  On Xarelto for anti coagulation.    CAD s/p PCI, Currently chest pain free.     Anemia of chronic disease:  Transfuse to keep hemoglobin greater than 7.  Currently  hemoglobin stable at 10.     Hyponatremia:  Resolved.      DVT prophylaxis: Xarelto.  Code Status: DNR.  Family Communication: family at bedside.  Disposition:   Status is: Inpatient Remains inpatient appropriate because: further work up for falls and unresponsive spells.    Level of care: Telemetry Consultants:  Neurology.   Procedures: (MRI Cspine and T spine.   Antimicrobials: none.    Subjective: Feeling better, aware of the surroundings . Pain not well controlled.  Restarted the MS contin.   Objective: Vitals:   05/04/22 2303 05/05/22 0236 05/05/22 0516 05/05/22 1409  BP: 116/79 (!) 140/75 119/80 100/67  Pulse: 87 (!) 104 77 (!) 114  Resp: 18 16 18 14   Temp: 98.2 F (36.8 C) 97.9 F (36.6 C) 98.8 F (37.1 C) 98.9 F (37.2 C)  TempSrc: Oral Oral Oral Oral  SpO2: 98% 100% 100% 99%  Weight:      Height:        Intake/Output Summary (Last 24 hours) at 05/05/2022 1411 Last data filed at 05/05/2022 0957 Gross per 24 hour  Intake 695.41 ml  Output 200 ml  Net 495.41 ml   Filed Weights   05/03/22 2125  Weight: 62.6 kg    Examination:  General exam: Appears calm and comfortable  Respiratory system: Clear to auscultation. Respiratory effort normal. Cardiovascular system: S1 & S2 heard, RRR. No JVD, No pedal edema. Gastrointestinal system: Abdomen is nondistended, soft and nontender. Normal bowel sounds heard. Central nervous system: Alert and oriented.bilateral upper extremity tremors.  Extremities: Symmetric 5 x 5 power. Skin: No rashes, lesions or ulcers Psychiatry: .  Mood & affect appropriate.     Data Reviewed: I have personally reviewed following labs and imaging studies  CBC: Recent Labs  Lab 05/03/22 2329 05-12-2022 0500  WBC 5.9 4.9  NEUTROABS 4.0  --   HGB 10.0* 10.0*  HCT 30.7* 31.2*  MCV 92.5 94.8  PLT 202 381    Basic Metabolic Panel: Recent Labs  Lab 05/03/22 2329 05-12-22 0500  NA 131* 138  K 3.7 3.8  CL 99 107  CO2 24  24  GLUCOSE 95 92  BUN 16 11  CREATININE 0.91 0.70  CALCIUM 9.0 9.0    GFR: Estimated Creatinine Clearance: 79.3 mL/min (by C-G formula based on SCr of 0.7 mg/dL).  Liver Function Tests: Recent Labs  Lab 05/03/22 2329 05/12/2022 0500  AST 21 19  ALT 13 13  ALKPHOS 90 88  BILITOT 0.6 0.8  PROT 7.0 6.9  ALBUMIN 3.0* 2.9*    CBG: Recent Labs  Lab 05/03/22 2340  GLUCAP 87     Recent Results (from the past 240 hour(s))  Culture, blood (routine x 2)     Status: None (Preliminary result)   Collection Time: 05/03/22 11:24 PM   Specimen: BLOOD  Result Value Ref Range Status   Specimen Description   Final    BLOOD Performed at [facility], [place] [address] [place], [place] [address]    Special Requests   Final    BOTTLES DRAWN AEROBIC AND ANAEROBIC Blood Culture results may not be optimal due to an excessive volume of blood received in culture bottles Performed at [facility], [address] [place], [place] [address]    Culture   Final    NO GROWTH 2 DAYS Performed at [facility], [place] [address], [place], [place] [address]    Report Status PENDING  Incomplete  Culture, blood (routine x 2)     Status: None (Preliminary result)   Collection Time: 05/04/22  3:19 PM   Specimen: BLOOD  Result Value Ref Range Status   Specimen Description   Final    BLOOD BLOOD LEFT ARM Performed at [facility] [address], [place], [place] [address]    Special Requests   Final    BOTTLES DRAWN AEROBIC ONLY Blood Culture adequate volume Performed at [facility] [address], [place], [place] [address]    Culture   Final    NO GROWTH < 12 HOURS Performed at [facility] [address] [place], [place] [address]    Report Status PENDING  Incomplete         Radiology Studies: EEG adult  Result Date: 05-12-2022 Lora Havens, MD     2022-05-12  8:52 AM Patient Name: [name] MRN: [id number]  Epilepsy Attending: Lora Havens Referring Physician/Provider: Jonnie Finner, DO Date: 05/04/2022 Duration: 23.49 mins Patient history:  66 year old male with 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  EEG to evaluate for seizure. Level of alertness: Awake AEDs during EEG study: None Technical aspects: This EEG study was done with scalp electrodes positioned according to the 10-20 International system of electrode placement. Electrical activity was reviewed with band pass  filter of 1-$RemoveBef'70Hz'suIxpnGItL$ , sensitivity of 7 uV/mm, display speed of 18mm/sec with a $Remo'60Hz'XzFyi$  notched filter applied as appropriate. EEG data were recorded continuously and digitally stored.  Video monitoring was available and reviewed as appropriate. Description: The posterior dominant rhythm consists of 9 Hz activity of moderate voltage (25-35 uV) seen predominantly in posterior head regions, symmetric and reactive to eye opening and eye closing. EEG showed intermittent generalized 3 to 6 Hz theta-delta slowing. Hyperventilation and photic stimulation were not performed.   ABNORMALITY - Intermittent slow, generalized IMPRESSION: This study is suggestive of mild diffuse encephalopathy, nonspecific etiology. No seizures or epileptiform discharges were seen throughout the recording. Lora Havens   CT Head Wo Contrast  Result Date: 05/04/2022 CLINICAL DATA:  Frequent falls. On chemotherapy. History of hepatitis-C. Neuroendocrine carcinoma. EXAM: CT HEAD WITHOUT CONTRAST CT CERVICAL SPINE WITHOUT CONTRAST CT CHEST, ABDOMEN AND PELVIS WITH CONTRAST TECHNIQUE: Contiguous axial images were obtained from the base of the skull through the  vertex without intravenous contrast. Multidetector CT imaging of the cervical spine was performed without intravenous contrast. Multiplanar CT image reconstructions were also generated. Multidetector CT imaging of the chest, abdomen and pelvis was performed following the standard protocol during bolus administration of intravenous contrast. RADIATION DOSE REDUCTION: This exam was performed according to the departmental dose-optimization program which includes automated exposure control, adjustment of the mA and/or kV according to patient size and/or use of iterative reconstruction technique. CONTRAST:  138mL OMNIPAQUE IOHEXOL 300 MG/ML  SOLN COMPARISON:  Head CT 04/20/2022, CTA neck and reconstructions 11/09/2016, abdomen and pelvis CT 01/14/2022, CTA chest 12/17/2021, and chest, abdomen and pelvis CT 03/14/2022. FINDINGS: CT HEAD FINDINGS Brain: There is known metastatic disease. Focal subcortical hypodensity is again noted in right superior frontal gyrus and left cerebellar hemisphere, and is unchanged. There are chronic lacunar infarcts in the basal ganglia and thalami, mild cerebral atrophy and small vessel disease. Vascular: There are scattered calcifications in the carotid siphons but no hyperdense central vessels. Skull: No fracture or focal lesion is seen. Sinuses/Orbits: There are small retention cysts and slight membrane thickening again in the right maxillary sinus. Trace fluid in the right mastoid tip. Other sinuses and mastoid air cells are clear. Nasal septum deviates to the right. Other: None. CT CERVICAL FINDINGS Alignment: There is 2 mm grade 1 degenerative anterolisthesis C3-4 and a slight cervical dextroscoliosis. No new alignment abnormality. Narrowing and osteophytes again noted anterolateral tibial joint. Skull base and vertebrae: There is osteopenia without evidence of fractures or focal bone lesion. Interval anterior plate fusion T6-O0 with solid interbody fusion is noted since the prior  study. Soft tissues and spinal canal: No prevertebral fluid or swelling. No visible canal hematoma. There are calcifications in both proximal cervical ICAs. Disc levels: There is mild disc space loss C4-5 and C5-6. The discs are otherwise normal in height. There are mild posterior disc osteophyte complexes at C3-4, C4-5 and C5-6 but no significant encroachment on the thecal sac. There are small anterior endplate spurs A0-0 through C6-7. Multilevel facet joint and uncinate spurring. Resulting degenerative foraminal stenosis is mild on the right at C2-3, severe on the right and moderate to severe on the left at C3-4, bilaterally moderate to severe C4-5. Other cervical foramina are patent. Other:  None. CT CHEST FINDINGS Cardiovascular: Right IJ port catheter terminates in the right atrium. The cardiac size is normal. There are coronary artery calcifications and CABG changes. There is no pericardial effusion. There is aortic and great vessel arthrosclerosis without aneurysm,  stenosis or dissection. The pulmonary arteries and veins are normal in caliber. Mediastinum/Nodes: There are enlarged right paratracheal lymph nodes, largest of which is in the low right paratracheal space measuring 2.8 x 2.5 cm, on the last study measuring 2.5 x 3.3 cm. More superiorly in the right paratracheal chain is stable heterogeneous lymph node measuring 1.3 cm in short axis on 2:10. Slightly prominent subcarinal nodes up to 1 cm in short axis are unchanged. No enlarged hilar nodes are seen with calcified right hilar lymph nodes again shown. There is no axillary or supraclavicular adenopathy. No thyroid mass is seen. The trachea is clear. Unremarkable thoracic esophagus. Lungs/Pleura: There are innumerable nodular pulmonary metastases. Largest in the left upper lobe is seen posteriorly and fissural based, measuring stable at 1.8 x 1.1 cm on 3:54. Another index nodule is noted in the left lobe measuring 1.1 cm on 3:116 and is also stable.  Another index nodule on the right in the lower lobe is 1.1 cm on 3:63 posterior to the right hilum it appears very slightly larger today, previously measuring 9.3 mm. Numerous other nodules are not significantly changed. No increased nodularity is suspected. Small loculated posterior basal right pleural effusion appears similar as well as adjacent streaky atelectasis or consolidation in the right lower lung field. There is no pneumothorax or left pleural fluid. The left main bronchus unremarkable. There is increased thickening and narrowing in the right main bronchus. Musculoskeletal: Irregularity and heterogeneity in the posterior right eleventh and twelfth ribs is again seen as well as a nondisplaced presumably pathologic fracture of the posterior right twelfth rib. Similar irregularity again noted in the posterior right ninth rib. There are degenerative changes of the thoracic spine osteopenia. CT ABDOMEN PELVIS FINDINGS Hepatobiliary: There are scattered tiny too small characterize hypodensities in the liver substance, unchanged without mass enhancement. Gallbladder and bile ducts are unremarkable. Pancreas: No focal abnormality. Spleen: Unremarkable. Adrenals/Urinary Tract: There is no adrenal mass. Stable 2.1 cm left renal cyst not requiring follow-up. There is a 2 mm nonobstructive caliceal stone in the superior pole right kidney and a few subcentimeter too small to characterize hypodensities in the upper right kidney. The bladder is normal thickness considering the degree of distention. Stomach/Bowel: No dilatation or wall thickening, including the appendix. There are colonic diverticula without evidence of diverticulitis. Vascular/Lymphatic: Moderate to heavy mixed plaque in the aorta and iliac arteries. Enlarging aortocaval lymph node now measures 2.4 x 1.9 cm, previously 1.9 x 1.5 cm. No pelvic adenopathy. Reproductive: No prostatomegaly. Other: There is no free air, free hemorrhage or free fluid. There  is no incarcerated hernia. Musculoskeletal: Osteopenia degenerative change lumbar spine. No regional fracture is seen or destructive bone lesion at the levels of the abdomen or pelvis. Again noted is a hypervascular mass in the lateral edge of the dorsal paraspinal musculature on the right, level of L2. There are stranding changes in the right flank subcutaneously which are increased from could be related to local trauma. IMPRESSION: 1. No acute intracranial CT findings or depressed skull fractures. 2. Right frontal lobe and left cerebellar stable hypodensities consistent with known metastases. 3. Osteopenia, degenerative and postsurgical changes of the cervical spine without evidence of fractures. 4. Subcutaneous stranding in the right flank region which may be traumatic. No other trauma related acute findings in the chest, abdomen or pelvis. 5. In the chest, interval enlargement is seen in the largest right paratracheal lymph node and there is increased thickening and narrowing of the right main bronchus.  6. Innumerable metastatic pulmonary nodules are mostly not significantly changed. One of those described above does measure slightly larger, in the right lower lobe. 7. Stable small loculated right pleural effusion and adjacent streaky atelectasis or infiltrate. 8. Interval enlargement of an aortocaval space node in the abdomen. 9. Irregularity most likely due to metastatic disease in the right posterior ninth, eleventh and twelfth ribs with pathologic nondisplaced fracture again noted in the posterior right twelfth rib. 10. Hypervascular mass in the lateral edge of the dorsal paraspinal musculature at the level of L2, unchanged. 11. Aortic and coronary artery atherosclerosis. Electronically Signed   By: Telford Nab M.D.   On: 05/04/2022 01:49   CT Cervical Spine Wo Contrast  Result Date: 05/04/2022 CLINICAL DATA:  Frequent falls. On chemotherapy. History of hepatitis-C. Neuroendocrine carcinoma. EXAM: CT  HEAD WITHOUT CONTRAST CT CERVICAL SPINE WITHOUT CONTRAST CT CHEST, ABDOMEN AND PELVIS WITH CONTRAST TECHNIQUE: Contiguous axial images were obtained from the base of the skull through the vertex without intravenous contrast. Multidetector CT imaging of the cervical spine was performed without intravenous contrast. Multiplanar CT image reconstructions were also generated. Multidetector CT imaging of the chest, abdomen and pelvis was performed following the standard protocol during bolus administration of intravenous contrast. RADIATION DOSE REDUCTION: This exam was performed according to the departmental dose-optimization program which includes automated exposure control, adjustment of the mA and/or kV according to patient size and/or use of iterative reconstruction technique. CONTRAST:  172m OMNIPAQUE IOHEXOL 300 MG/ML  SOLN COMPARISON:  Head CT 04/20/2022, CTA neck and reconstructions 11/09/2016, abdomen and pelvis CT 01/14/2022, CTA chest 12/17/2021, and chest, abdomen and pelvis CT 03/14/2022. FINDINGS: CT HEAD FINDINGS Brain: There is known metastatic disease. Focal subcortical hypodensity is again noted in right superior frontal gyrus and left cerebellar hemisphere, and is unchanged. There are chronic lacunar infarcts in the basal ganglia and thalami, mild cerebral atrophy and small vessel disease. Vascular: There are scattered calcifications in the carotid siphons but no hyperdense central vessels. Skull: No fracture or focal lesion is seen. Sinuses/Orbits: There are small retention cysts and slight membrane thickening again in the right maxillary sinus. Trace fluid in the right mastoid tip. Other sinuses and mastoid air cells are clear. Nasal septum deviates to the right. Other: None. CT CERVICAL FINDINGS Alignment: There is 2 mm grade 1 degenerative anterolisthesis C3-4 and a slight cervical dextroscoliosis. No new alignment abnormality. Narrowing and osteophytes again noted anterolateral tibial joint.  Skull base and vertebrae: There is osteopenia without evidence of fractures or focal bone lesion. Interval anterior plate fusion CU7-O5with solid interbody fusion is noted since the prior study. Soft tissues and spinal canal: No prevertebral fluid or swelling. No visible canal hematoma. There are calcifications in both proximal cervical ICAs. Disc levels: There is mild disc space loss C4-5 and C5-6. The discs are otherwise normal in height. There are mild posterior disc osteophyte complexes at C3-4, C4-5 and C5-6 but no significant encroachment on the thecal sac. There are small anterior endplate spurs CD6-6through C6-7. Multilevel facet joint and uncinate spurring. Resulting degenerative foraminal stenosis is mild on the right at C2-3, severe on the right and moderate to severe on the left at C3-4, bilaterally moderate to severe C4-5. Other cervical foramina are patent. Other:  None. CT CHEST FINDINGS Cardiovascular: Right IJ port catheter terminates in the right atrium. The cardiac size is normal. There are coronary artery calcifications and CABG changes. There is no pericardial effusion. There is aortic and great vessel arthrosclerosis  without aneurysm, stenosis or dissection. The pulmonary arteries and veins are normal in caliber. Mediastinum/Nodes: There are enlarged right paratracheal lymph nodes, largest of which is in the low right paratracheal space measuring 2.8 x 2.5 cm, on the last study measuring 2.5 x 3.3 cm. More superiorly in the right paratracheal chain is stable heterogeneous lymph node measuring 1.3 cm in short axis on 2:10. Slightly prominent subcarinal nodes up to 1 cm in short axis are unchanged. No enlarged hilar nodes are seen with calcified right hilar lymph nodes again shown. There is no axillary or supraclavicular adenopathy. No thyroid mass is seen. The trachea is clear. Unremarkable thoracic esophagus. Lungs/Pleura: There are innumerable nodular pulmonary metastases. Largest in the left  upper lobe is seen posteriorly and fissural based, measuring stable at 1.8 x 1.1 cm on 3:54. Another index nodule is noted in the left lobe measuring 1.1 cm on 3:116 and is also stable. Another index nodule on the right in the lower lobe is 1.1 cm on 3:63 posterior to the right hilum it appears very slightly larger today, previously measuring 9.3 mm. Numerous other nodules are not significantly changed. No increased nodularity is suspected. Small loculated posterior basal right pleural effusion appears similar as well as adjacent streaky atelectasis or consolidation in the right lower lung field. There is no pneumothorax or left pleural fluid. The left main bronchus unremarkable. There is increased thickening and narrowing in the right main bronchus. Musculoskeletal: Irregularity and heterogeneity in the posterior right eleventh and twelfth ribs is again seen as well as a nondisplaced presumably pathologic fracture of the posterior right twelfth rib. Similar irregularity again noted in the posterior right ninth rib. There are degenerative changes of the thoracic spine osteopenia. CT ABDOMEN PELVIS FINDINGS Hepatobiliary: There are scattered tiny too small characterize hypodensities in the liver substance, unchanged without mass enhancement. Gallbladder and bile ducts are unremarkable. Pancreas: No focal abnormality. Spleen: Unremarkable. Adrenals/Urinary Tract: There is no adrenal mass. Stable 2.1 cm left renal cyst not requiring follow-up. There is a 2 mm nonobstructive caliceal stone in the superior pole right kidney and a few subcentimeter too small to characterize hypodensities in the upper right kidney. The bladder is normal thickness considering the degree of distention. Stomach/Bowel: No dilatation or wall thickening, including the appendix. There are colonic diverticula without evidence of diverticulitis. Vascular/Lymphatic: Moderate to heavy mixed plaque in the aorta and iliac arteries. Enlarging aortocaval  lymph node now measures 2.4 x 1.9 cm, previously 1.9 x 1.5 cm. No pelvic adenopathy. Reproductive: No prostatomegaly. Other: There is no free air, free hemorrhage or free fluid. There is no incarcerated hernia. Musculoskeletal: Osteopenia degenerative change lumbar spine. No regional fracture is seen or destructive bone lesion at the levels of the abdomen or pelvis. Again noted is a hypervascular mass in the lateral edge of the dorsal paraspinal musculature on the right, level of L2. There are stranding changes in the right flank subcutaneously which are increased from could be related to local trauma. IMPRESSION: 1. No acute intracranial CT findings or depressed skull fractures. 2. Right frontal lobe and left cerebellar stable hypodensities consistent with known metastases. 3. Osteopenia, degenerative and postsurgical changes of the cervical spine without evidence of fractures. 4. Subcutaneous stranding in the right flank region which may be traumatic. No other trauma related acute findings in the chest, abdomen or pelvis. 5. In the chest, interval enlargement is seen in the largest right paratracheal lymph node and there is increased thickening and narrowing of the right  main bronchus. 6. Innumerable metastatic pulmonary nodules are mostly not significantly changed. One of those described above does measure slightly larger, in the right lower lobe. 7. Stable small loculated right pleural effusion and adjacent streaky atelectasis or infiltrate. 8. Interval enlargement of an aortocaval space node in the abdomen. 9. Irregularity most likely due to metastatic disease in the right posterior ninth, eleventh and twelfth ribs with pathologic nondisplaced fracture again noted in the posterior right twelfth rib. 10. Hypervascular mass in the lateral edge of the dorsal paraspinal musculature at the level of L2, unchanged. 11. Aortic and coronary artery atherosclerosis. Electronically Signed   By: Telford Nab M.D.   On:  05/04/2022 01:49   CT CHEST ABDOMEN PELVIS W CONTRAST  Result Date: 05/04/2022 CLINICAL DATA:  Frequent falls. On chemotherapy. History of hepatitis-C. Neuroendocrine carcinoma. EXAM: CT HEAD WITHOUT CONTRAST CT CERVICAL SPINE WITHOUT CONTRAST CT CHEST, ABDOMEN AND PELVIS WITH CONTRAST TECHNIQUE: Contiguous axial images were obtained from the base of the skull through the vertex without intravenous contrast. Multidetector CT imaging of the cervical spine was performed without intravenous contrast. Multiplanar CT image reconstructions were also generated. Multidetector CT imaging of the chest, abdomen and pelvis was performed following the standard protocol during bolus administration of intravenous contrast. RADIATION DOSE REDUCTION: This exam was performed according to the departmental dose-optimization program which includes automated exposure control, adjustment of the mA and/or kV according to patient size and/or use of iterative reconstruction technique. CONTRAST:  183mL OMNIPAQUE IOHEXOL 300 MG/ML  SOLN COMPARISON:  Head CT 04/20/2022, CTA neck and reconstructions 11/09/2016, abdomen and pelvis CT 01/14/2022, CTA chest 12/17/2021, and chest, abdomen and pelvis CT 03/14/2022. FINDINGS: CT HEAD FINDINGS Brain: There is known metastatic disease. Focal subcortical hypodensity is again noted in right superior frontal gyrus and left cerebellar hemisphere, and is unchanged. There are chronic lacunar infarcts in the basal ganglia and thalami, mild cerebral atrophy and small vessel disease. Vascular: There are scattered calcifications in the carotid siphons but no hyperdense central vessels. Skull: No fracture or focal lesion is seen. Sinuses/Orbits: There are small retention cysts and slight membrane thickening again in the right maxillary sinus. Trace fluid in the right mastoid tip. Other sinuses and mastoid air cells are clear. Nasal septum deviates to the right. Other: None. CT CERVICAL FINDINGS Alignment: There  is 2 mm grade 1 degenerative anterolisthesis C3-4 and a slight cervical dextroscoliosis. No new alignment abnormality. Narrowing and osteophytes again noted anterolateral tibial joint. Skull base and vertebrae: There is osteopenia without evidence of fractures or focal bone lesion. Interval anterior plate fusion [id number] with solid interbody fusion is noted since the prior study. Soft tissues and spinal canal: No prevertebral fluid or swelling. No visible canal hematoma. There are calcifications in both proximal cervical ICAs. Disc levels: There is mild disc space loss C4-5 and C5-6. The discs are otherwise normal in height. There are mild posterior disc osteophyte complexes at C3-4, C4-5 and C5-6 but no significant encroachment on the thecal sac. There are small anterior endplate spurs C7-8 through C6-7. Multilevel facet joint and uncinate spurring. Resulting degenerative foraminal stenosis is mild on the right at C2-3, severe on the right and moderate to severe on the left at C3-4, bilaterally moderate to severe C4-5. Other cervical foramina are patent. Other:  None. CT CHEST FINDINGS Cardiovascular: Right IJ port catheter terminates in the right atrium. The cardiac size is normal. There are coronary artery calcifications and CABG changes. There is no pericardial effusion. There is aortic and  great vessel arthrosclerosis without aneurysm, stenosis or dissection. The pulmonary arteries and veins are normal in caliber. Mediastinum/Nodes: There are enlarged right paratracheal lymph nodes, largest of which is in the low right paratracheal space measuring 2.8 x 2.5 cm, on the last study measuring 2.5 x 3.3 cm. More superiorly in the right paratracheal chain is stable heterogeneous lymph node measuring 1.3 cm in short axis on 2:10. Slightly prominent subcarinal nodes up to 1 cm in short axis are unchanged. No enlarged hilar nodes are seen with calcified right hilar lymph nodes again shown. There is no axillary or  supraclavicular adenopathy. No thyroid mass is seen. The trachea is clear. Unremarkable thoracic esophagus. Lungs/Pleura: There are innumerable nodular pulmonary metastases. Largest in the left upper lobe is seen posteriorly and fissural based, measuring stable at 1.8 x 1.1 cm on 3:54. Another index nodule is noted in the left lobe measuring 1.1 cm on 3:116 and is also stable. Another index nodule on the right in the lower lobe is 1.1 cm on 3:63 posterior to the right hilum it appears very slightly larger today, previously measuring 9.3 mm. Numerous other nodules are not significantly changed. No increased nodularity is suspected. Small loculated posterior basal right pleural effusion appears similar as well as adjacent streaky atelectasis or consolidation in the right lower lung field. There is no pneumothorax or left pleural fluid. The left main bronchus unremarkable. There is increased thickening and narrowing in the right main bronchus. Musculoskeletal: Irregularity and heterogeneity in the posterior right eleventh and twelfth ribs is again seen as well as a nondisplaced presumably pathologic fracture of the posterior right twelfth rib. Similar irregularity again noted in the posterior right ninth rib. There are degenerative changes of the thoracic spine osteopenia. CT ABDOMEN PELVIS FINDINGS Hepatobiliary: There are scattered tiny too small characterize hypodensities in the liver substance, unchanged without mass enhancement. Gallbladder and bile ducts are unremarkable. Pancreas: No focal abnormality. Spleen: Unremarkable. Adrenals/Urinary Tract: There is no adrenal mass. Stable 2.1 cm left renal cyst not requiring follow-up. There is a 2 mm nonobstructive caliceal stone in the superior pole right kidney and a few subcentimeter too small to characterize hypodensities in the upper right kidney. The bladder is normal thickness considering the degree of distention. Stomach/Bowel: No dilatation or wall thickening,  including the appendix. There are colonic diverticula without evidence of diverticulitis. Vascular/Lymphatic: Moderate to heavy mixed plaque in the aorta and iliac arteries. Enlarging aortocaval lymph node now measures 2.4 x 1.9 cm, previously 1.9 x 1.5 cm. No pelvic adenopathy. Reproductive: No prostatomegaly. Other: There is no free air, free hemorrhage or free fluid. There is no incarcerated hernia. Musculoskeletal: Osteopenia degenerative change lumbar spine. No regional fracture is seen or destructive bone lesion at the levels of the abdomen or pelvis. Again noted is a hypervascular mass in the lateral edge of the dorsal paraspinal musculature on the right, level of L2. There are stranding changes in the right flank subcutaneously which are increased from could be related to local trauma. IMPRESSION: 1. No acute intracranial CT findings or depressed skull fractures. 2. Right frontal lobe and left cerebellar stable hypodensities consistent with known metastases. 3. Osteopenia, degenerative and postsurgical changes of the cervical spine without evidence of fractures. 4. Subcutaneous stranding in the right flank region which may be traumatic. No other trauma related acute findings in the chest, abdomen or pelvis. 5. In the chest, interval enlargement is seen in the largest right paratracheal lymph node and there is increased thickening and narrowing  of the right main bronchus. 6. Innumerable metastatic pulmonary nodules are mostly not significantly changed. One of those described above does measure slightly larger, in the right lower lobe. 7. Stable small loculated right pleural effusion and adjacent streaky atelectasis or infiltrate. 8. Interval enlargement of an aortocaval space node in the abdomen. 9. Irregularity most likely due to metastatic disease in the right posterior ninth, eleventh and twelfth ribs with pathologic nondisplaced fracture again noted in the posterior right twelfth rib. 10. Hypervascular  mass in the lateral edge of the dorsal paraspinal musculature at the level of L2, unchanged. 11. Aortic and coronary artery atherosclerosis. Electronically Signed   By: Telford Nab M.D.   On: 05/04/2022 01:49   DG Chest Port 1 View  Result Date: 05/04/2022 CLINICAL DATA:  Frequent falls, weakness, on chemo followed by [facility]. EXAM: DG HIP (WITH OR WITHOUT PELVIS) 2-3V RIGHT; PORTABLE CHEST - 1 VIEW COMPARISON:  Portable chest 04/20/2022, chest CT 03/14/2022, CT abdomen and pelvis and reconstructions 01/14/2022. FINDINGS: Chest: There are innumerable bilateral pulmonary nodules consistent with metastases. Small right pleural effusion is unchanged. No focal pneumonia is evident. Heart size and vascular pattern normal with CABG changes and right chest IJ port catheter again terminating in the right atrium. There is aortic atherosclerosis with stable mediastinum. There is osteopenia mild thoracic levoscoliosis and spondylosis. AP pelvis and AP and frog-leg right hip: There is no evidence of hip fracture or dislocation. There is no evidence of arthropathy or other focal bone abnormality. There are scattered calcifications in the right femoral artery. IMPRESSION: 1. Stable radiographic chest findings with innumerable nodular lung metastases. No acute abnormality. 2. Small right pleural effusion, unchanged. 3. No AP pelvic fracture or diastasis is seen. No right hip fracture is seen. Electronically Signed   By: Telford Nab M.D.   On: 05/04/2022 00:49   DG Hip Unilat W or Wo Pelvis 2-3 Views Right  Result Date: 05/04/2022 CLINICAL DATA:  Frequent falls, weakness, on chemo followed by [facility]. EXAM: DG HIP (WITH OR WITHOUT PELVIS) 2-3V RIGHT; PORTABLE CHEST - 1 VIEW COMPARISON:  Portable chest 04/20/2022, chest CT 03/14/2022, CT abdomen and pelvis and reconstructions 01/14/2022. FINDINGS: Chest: There are innumerable bilateral pulmonary nodules consistent with metastases. Small right pleural  effusion is unchanged. No focal pneumonia is evident. Heart size and vascular pattern normal with CABG changes and right chest IJ port catheter again terminating in the right atrium. There is aortic atherosclerosis with stable mediastinum. There is osteopenia mild thoracic levoscoliosis and spondylosis. AP pelvis and AP and frog-leg right hip: There is no evidence of hip fracture or dislocation. There is no evidence of arthropathy or other focal bone abnormality. There are scattered calcifications in the right femoral artery. IMPRESSION: 1. Stable radiographic chest findings with innumerable nodular lung metastases. No acute abnormality. 2. Small right pleural effusion, unchanged. 3. No AP pelvic fracture or diastasis is seen. No right hip fracture is seen. Electronically Signed   By: Telford Nab M.D.   On: 05/04/2022 00:49        Scheduled Meds:  chlorhexidine gluconate (MEDLINE KIT)  15 mL Mouth Rinse BID   Chlorhexidine Gluconate Cloth  6 each Topical Daily   DULoxetine  30 mg Oral BID   morphine  60 mg Oral Q12H   mouth rinse  15 mL Mouth Rinse 10 times per day   rivaroxaban  20 mg Oral QPM   Continuous Infusions:  sodium chloride 75 mL/hr at 05/04/22 1743  LOS: 1 day        Hosie Poisson, MD [facility]   To contact the attending provider between 7A-7P or the covering provider during after hours 7P-7A, please log into the web site [email or web address] and access using universal [place] password for that web site. If you do not have the password, please call the hospital operator.  05/05/2022, 2:11 PM

## 2022-05-05 NOTE — Evaluation (Signed)
Physical Therapy Evaluation Patient Details Name: [name] MRN: [id number] DOB: February 24, 1956 Today's Date: 05/05/2022  History of Present Illness  [name] is a 66 y.o. male admitted 05/03/22 with weakness over the last several weeks with multiple falls after these falls the patient seems very shaky and has a brief, blank stare. After these episodes he comes to but is confused for 1 to 2 minutes. He states he is no longer able to text due to difficulty with tremor and coordination with the left hand. He is currently undergoing a clinical trial for lung cancer at [facility] and received his second round of chemotherapy. PMHx of atrial fibrillation (on Xarelto), anemia, basal cell carcinoma, CAD s/p CABG, carotid artery disease, ectopic atrial includes tachycardia, hepatitis C (treated and cured per Epic), hypercholesterolemia, renal stones, HTN, ischemic cardiomyopathy, metastatic large cell neuroendocrine carcinoma, s/p whole-brain irradiation for brain metastases one month ago, migraines, small stroke in 2018, symptomatic hypotension and syncope.   Clinical Impression  Pt is a 66 y.o. male with above HPI. Pt is typically modified independent with mobility and ADLs, but has recently been experiencing weakness and tremors making tasks more difficult. Pt performed sit to stand transfers and ambulation with MIN guard for safety and cues for safe hand placement. Pt ambulated total of ~107ft with close chair follow provided for safety, limited distance due to decreased activity tolerance and onset of lightheadedness (see vitals during session below). Pt will have assist from multiple family members and friends upon d/c. Pt will benefit from continued skilled PT services during stay to maximize functional mobility and increase independence, but anticipate no further skilled PT needs upon d/c.         Recommendations for follow up therapy are one component of a multi-disciplinary discharge planning process,  led by the attending physician.  Recommendations may be updated based on patient status, additional functional criteria and insurance authorization.  Follow Up Recommendations No PT follow up      Assistance Recommended at Discharge Intermittent Supervision/Assistance  Patient can return home with the following  A little help with walking and/or transfers;A little help with bathing/dressing/bathroom;Assistance with cooking/housework;Help with stairs or ramp for entrance    Equipment Recommendations None recommended by PT (pt owns RW)    05/05/22 1015  Orthostatic Lying   BP- Lying 113/78  Pulse- Lying 84  Orthostatic Sitting  BP- Sitting 93/77 (sitting ~5-33min 105/84, 110bpm)  Pulse- Sitting 115  Orthostatic Standing at 0 minutes  BP- Standing at 0 minutes (!) 67/58  Pulse- Standing at 0 minutes 123  Orthostatic Standing at 3 minutes  BP- Standing at 3 minutes 115/77 (unable to tolerate standing for 3 min due to symptom onset)  Pulse- Standing at 3 minutes 101      Functional Status Assessment Patient has had a recent decline in their functional status and demonstrates the ability to make significant improvements in function in a reasonable and predictable amount of time.     Precautions / Restrictions Precautions Precautions: Fall Precaution Comments: orthostatic Restrictions Weight Bearing Restrictions: No      Mobility  Bed Mobility Overal bed mobility: Modified Independent             General bed mobility comments: HOB slightly elevated    Transfers Overall transfer level: Needs assistance Equipment used: Rolling walker (2 wheels) Transfers: Sit to/from Stand Sit to Stand: Min guard           General transfer comment: x2 from EOB, increased use  of UEs.    Ambulation/Gait Ambulation/Gait assistance: Min guard Gait Distance (Feet): 35 Feet Assistive device: Rolling walker (2 wheels) Gait Pattern/deviations: Decreased stride length, Step-through  pattern, Trunk flexed Gait velocity: decreased     General Gait Details: no overt LOB observed, derceased gait speed. limited distance due to decreased endurace/activity tolerance and onset of lightheadedness, requesting to sit, chair follow provided throughout.  Vitals 106/84, 92bpm at end of session in recliner.  Stairs            Wheelchair Mobility    Modified Rankin (Stroke Patients Only)       Balance Overall balance assessment: Needs assistance Sitting-balance support: Feet supported Sitting balance-Leahy Scale: Good     Standing balance support: Bilateral upper extremity supported, During functional activity Standing balance-Leahy Scale: Fair                               Pertinent Vitals/Pain Pain Assessment Pain Assessment: 0-10 Pain Score: 8  Pain Location: R hip Pain Descriptors / Indicators: Aching, Sharp Pain Intervention(s): Limited activity within patient's tolerance, Monitored during session, Patient requesting pain meds-RN notified    Home Living Family/patient expects to be discharged to:: Private residence Living Arrangements: Spouse/significant other Available Help at Discharge: Family;Available 24 hours/day Type of Home: House Home Access: Stairs to enter Entrance Stairs-Rails: Psychiatric nurse of Steps: 6 Alternate Level Stairs-Number of Steps: 13 Home Layout: Two level;Bed/bath upstairs Home Equipment: Merchant navy officer (4 wheels) Additional Comments: daughter staying through mid september to assist, [place]. Been sleeping downstairs on couch. 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    Prior Function Prior Level of Function : Needs assist             Mobility Comments: multiple falls at home. Been using RW ADLs Comments: takes baths- sits down in tub. no grab bars. Just started [facility] aide 3-4/week assist with bathing, prepping meals just began last week.     Hand  Dominance   Dominant Hand: Left    Extremity/Trunk Assessment        Lower Extremity Assessment Lower Extremity Assessment: Generalized weakness    Cervical / Trunk Assessment Cervical / Trunk Assessment: Normal  Communication   Communication: No difficulties  Cognition Arousal/Alertness: Awake/alert Behavior During Therapy: WFL for tasks assessed/performed Overall Cognitive Status: Within Functional Limits for tasks assessed                                          General Comments      Exercises     Assessment/Plan    PT Assessment Patient needs continued PT services  PT Problem List Decreased strength;Decreased activity tolerance;Decreased balance;Decreased mobility;Pain       PT Treatment Interventions DME instruction;Gait training;Stair training;Functional mobility training;Therapeutic activities;Therapeutic exercise;Balance training;Patient/family education    PT Goals (Current goals can be found in the Care Plan section)  Acute Rehab PT Goals Patient Stated Goal: figure out what is going on and learn tools/strategies to help maintain independence PT Goal Formulation: With patient Time For Goal Achievement: 05/19/22 Potential to Achieve Goals: Good    Frequency Min 3X/week     Co-evaluation PT/OT/SLP Co-Evaluation/Treatment: Yes Reason for Co-Treatment: Necessary to address cognition/behavior during functional activity;For patient/therapist safety;To address functional/ADL transfers PT goals addressed during session: Mobility/safety with mobility;Balance;Proper  use of DME;Strengthening/ROM OT goals addressed during session: ADL's and self-care;Proper use of Adaptive equipment and DME;Strengthening/ROM       AM-PAC PT "6 Clicks" Mobility  Outcome Measure Help needed turning from your back to your side while in a flat bed without using bedrails?: None Help needed moving from lying on your back to sitting on the side of a flat bed without  using bedrails?: None Help needed moving to and from a bed to a chair (including a wheelchair)?: A Little Help needed standing up from a chair using your arms (e.g., wheelchair or bedside chair)?: A Little Help needed to walk in hospital room?: A Little Help needed climbing 3-5 steps with a railing? : A Lot 6 Click Score: 19    End of Session Equipment Utilized During Treatment: Gait belt Activity Tolerance: Patient limited by fatigue;Treatment limited secondary to medical complications (Comment) (onset of lightheadedness) Patient left: in chair;with call bell/phone within reach;with chair alarm set Nurse Communication: Mobility status;Patient requests pain meds PT Visit Diagnosis: Unsteadiness on feet (R26.81);Muscle weakness (generalized) (M62.81);Repeated falls (R29.6)    Time: 3212-2482 PT Time Calculation (min) (ACUTE ONLY): 37 min   Charges:   PT Evaluation $PT Eval Low Complexity: 1 Low PT Treatments $Therapeutic Activity: 8-22 mins        Festus Barren PT, DPT  Acute Rehabilitation Services  Office [phone]   05/05/2022, 12:41 PM

## 2022-05-05 NOTE — Evaluation (Signed)
Occupational Therapy Evaluation Patient Details Name: [name] MRN: [id number] DOB: 14-Oct-1955 Today's Date: 05/05/2022   History of Present Illness [name] is a 66 y.o. male admitted 05/03/22 with weakness over the last several weeks with multiple falls after these falls the patient seems very shaky and has a brief, blank stare. After these episodes he comes to but is confused for 1 to 2 minutes. He states he is no longer able to text due to difficulty with tremor and coordination with the left hand. He is currently undergoing a clinical trial for lung cancer at [facility] and received his second round of chemotherapy. PMHx of atrial fibrillation (on Xarelto), anemia, basal cell carcinoma, CAD s/p CABG, carotid artery disease, ectopic atrial includes tachycardia, hepatitis C (treated and cured per Epic), hypercholesterolemia, renal stones, HTN, ischemic cardiomyopathy, metastatic large cell neuroendocrine carcinoma, s/p whole-brain irradiation for brain metastases one month ago, migraines, small stroke in 2018, symptomatic hypotension and syncope.   Clinical Impression   Pt is typically mod I using DME for transfers and just last week started getting aide to help with bathing.  He has been falling 10-15 times with a couple close calls with hitting his head. Pt's bed/bath is upstairs and he no longer sleeps upstairs, just downstairs on the couch. Pt today is demonstrating tremors in BUE L>R and it is starting to impact function in LUE (Pt is left handed) texting and eating and for peri care. Pt today is min A +2 chair follow for hallway mobility, positive for orthostatics, educated on safety and giving time for transfers. Pt is min guard for LB dressing, min guard A for standing grooming. OT will continue to follow acutely, plan for [place] post-acute to maximize safety and independence in ADL and functional transfers.      Recommendations for follow up therapy are one component of a  multi-disciplinary discharge planning process, led by the attending physician.  Recommendations may be updated based on patient status, additional functional criteria and insurance authorization.   Follow Up Recommendations  Home health OT    Assistance Recommended at Discharge Intermittent Supervision/Assistance  Patient can return home with the following A little help with walking and/or transfers;A little help with bathing/dressing/bathroom;Assistance with cooking/housework;Assistance with feeding;Direct supervision/assist for medications management;Direct supervision/assist for financial management;Assist for transportation;Help with stairs or ramp for entrance    Functional Status Assessment  Patient has had a recent decline in their functional status and demonstrates the ability to make significant improvements in function in a reasonable and predictable amount of time.  Equipment Recommendations  BSC/3in1    Recommendations for Other Services       Precautions / Restrictions Precautions Precautions: Fall Precaution Comments: orthostatic Restrictions Weight Bearing Restrictions: No      Mobility Bed Mobility Overal bed mobility: Needs Assistance Bed Mobility: Supine to Sit     Supine to sit: Supervision     General bed mobility comments: HOB slightly elevated    Transfers Overall transfer level: Needs assistance Equipment used: Rolling walker (2 wheels) Transfers: Sit to/from Stand Sit to Stand: Min guard           General transfer comment: x2 from EOB, increased use of UEs.      Balance Overall balance assessment: Needs assistance Sitting-balance support: Feet supported Sitting balance-Leahy Scale: Good     Standing balance support: Bilateral upper extremity supported, During functional activity Standing balance-Leahy Scale: Fair  ADL either performed or assessed with clinical judgement   ADL Overall ADL's :  Needs assistance/impaired Eating/Feeding: Set up;Sitting   Grooming: Wash/dry hands;Wash/dry face;Set up;Sitting Grooming Details (indicate cue type and reason): in recliner Upper Body Bathing: Minimal assistance;Sitting   Lower Body Bathing: Minimal assistance;Sitting/lateral leans   Upper Body Dressing : Set up   Lower Body Dressing: Min guard;Sitting/lateral leans Lower Body Dressing Details (indicate cue type and reason): able to perform figure 4 for shoes/socks Toilet Transfer: Minimal assistance;+2 for safety/equipment;Ambulation;Rolling walker (2 wheels)   Toileting- Clothing Manipulation and Hygiene: Minimal assistance   Tub/ Shower Transfer: Tub transfer;Moderate assistance Tub/Shower Transfer Details (indicate cue type and reason): gets down in the tub because he gets too cold out of the water Functional mobility during ADLs: Min guard;Minimal assistance;+2 for safety/equipment;Rolling walker (2 wheels) General ADL Comments: decreased activity tolerance     Vision Baseline Vision/History: 1 Wears glasses Ability to See in Adequate Light: 0 Adequate Patient Visual Report: No change from baseline       Perception     Praxis      Pertinent Vitals/Pain Pain Assessment Pain Assessment: 0-10 Pain Score: 8  Pain Location: R hip Pain Descriptors / Indicators: Aching, Sharp Pain Intervention(s): Monitored during session, Repositioned, Patient requesting pain meds-RN notified     Hand Dominance Left   Extremity/Trunk Assessment Upper Extremity Assessment Upper Extremity Assessment: RUE deficits/detail;LUE deficits/detail;Generalized weakness RUE Deficits / Details: new tremor present RUE Coordination: decreased fine motor;decreased gross motor LUE Deficits / Details: new tremor present L>R functionally getting in the way of independence LUE Coordination: decreased fine motor;decreased gross motor   Lower Extremity Assessment Lower Extremity Assessment: Defer to PT  evaluation   Cervical / Trunk Assessment Cervical / Trunk Assessment: Normal   Communication Communication Communication: No difficulties   Cognition Arousal/Alertness: Awake/alert Behavior During Therapy: WFL for tasks assessed/performed Overall Cognitive Status: Within Functional Limits for tasks assessed                                       General Comments       Exercises     Shoulder Instructions      Home Living Family/patient expects to be discharged to:: Private residence Living Arrangements: Spouse/significant other Available Help at Discharge: Family;Available 24 hours/day Type of Home: House Home Access: Stairs to enter [employer] of Steps: 6 Entrance Stairs-Rails: Right;Left Home Layout: Two level;Bed/bath upstairs Alternate Level Stairs-Number of Steps: 13 Alternate Level Stairs-Rails: Can reach both Bathroom Shower/Tub: Tub/shower unit         Home Equipment: Merchant navy officer (4 wheels)   Additional Comments: daughter staying through mid september to assist, [place]. Been sleeping downstairs on couch. 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      Prior Functioning/Environment Prior Level of Function : Needs assist             Mobility Comments: multiple falls at home. Been using RW ADLs Comments: takes baths- sits down in tub. no grab bars. Just started [facility] aide 3-4/week assist with bathing, prepping meals just began last week.        OT Problem List: Decreased strength;Decreased activity tolerance;Impaired balance (sitting and/or standing);Decreased cognition;Decreased safety awareness;Decreased knowledge of use of DME or AE;Decreased knowledge of precautions;Cardiopulmonary status limiting activity;Impaired UE functional use;Pain      OT Treatment/Interventions: Self-care/ADL training;Energy conservation;DME and/or  AE instruction;Therapeutic activities;Cognitive  remediation/compensation;Patient/family education;Balance training    OT Goals(Current goals can be found in the care plan section) Acute Rehab OT Goals Patient Stated Goal: get tremors under control OT Goal Formulation: With patient Time For Goal Achievement: 05/19/22 Potential to Achieve Goals: Good ADL Goals Pt Will Perform Eating: with modified independence;with adaptive utensils;sitting Pt Will Perform Grooming: standing;with min guard assist Pt Will Transfer to Toilet: with supervision;ambulating Pt Will Perform Toileting - Clothing Manipulation and hygiene: with supervision;sitting/lateral leans Additional ADL Goal #1: Pt will verbalize 3 ways of conserving energy during ADL without cues  OT Frequency: Min 2X/week    Co-evaluation PT/OT/SLP Co-Evaluation/Treatment: Yes Reason for Co-Treatment: Necessary to address cognition/behavior during functional activity;For patient/therapist safety;To address functional/ADL transfers PT goals addressed during session: Mobility/safety with mobility;Balance;Proper use of DME;Strengthening/ROM OT goals addressed during session: ADL's and self-care;Proper use of Adaptive equipment and DME;Strengthening/ROM      AM-PAC OT "6 Clicks" Daily Activity     Outcome Measure Help from another person eating meals?: A Little Help from another person taking care of personal grooming?: A Little Help from another person toileting, which includes using toliet, bedpan, or urinal?: A Little Help from another person bathing (including washing, rinsing, drying)?: A Little Help from another person to put on and taking off regular upper body clothing?: A Little Help from another person to put on and taking off regular lower body clothing?: A Little 6 Click Score: 18   End of Session Equipment Utilized During Treatment: Gait belt;Rolling walker (2 wheels) Nurse Communication: Mobility status  Activity Tolerance: Patient tolerated treatment well Patient left:  in chair;with call bell/phone within reach;with chair alarm set  OT Visit Diagnosis: Unsteadiness on feet (R26.81);Muscle weakness (generalized) (M62.81);History of falling (Z91.81);Repeated falls (R29.6);Pain;Other symptoms and signs involving cognitive function;Feeding difficulties (R63.3) Pain - Right/Left: Right Pain - part of body: Hip                Time: 8527-7824 OT Time Calculation (min): 37 min Charges:  OT General Charges $OT Visit: 1 Visit OT Evaluation $OT Eval Moderate Complexity: Coleman OTR/L Acute Rehabilitation Services Office: [place] 05/05/2022, 1:24 PM

## 2022-05-05 NOTE — Plan of Care (Signed)
Discussed briefly with Dr. Karleen Hampshire.  At this time his spells have resolved with supportive care.  Therefore I do not think the yield of continuous EEG monitoring would be high, but it should be reconsidered if his spells recur  He does remain quite orthostatic, recommend abdominal binder and TED stockings, with additional support and treatment for orthostasis per primary team  Given his significant gait difficulty, frequent falls, and highly increased reflexes on Dr. Yvetta Coder examination (which have not been previously documented on my review of his chart, but may be related to his prior cervical spine surgery), I do think repeating MRI cervical spine and thoracic spine to exclude an acute compressive myelopathy is appropriate  Neurology will follow-up MRI cervical spine and thoracic spine but otherwise be available on an as-needed basis going forward  Lesleigh Noe MD-PhD [facility] [phone] Available 7 AM to 7 PM, outside these hours please contact Neurologist on call listed on AMION   No charge plan of care note

## 2022-05-05 NOTE — TOC Initial Note (Signed)
Transition of Care [facility]) - Initial/Assessment Note    Patient Details  Name: [name] MRN: [id number] Date of Birth: March 21, 1956  Transition of Care [facility]) CM/SW Contact:    Leeroy Cha, RN Phone Number: 05/05/2022, 8:09 AM  Clinical Narrative:                  Transition of Care [facility]) Screening Note   Patient Details  Name: [name] Date of Birth: 1956-03-31   Transition of Care [facility]) CM/SW Contact:    Leeroy Cha, RN Phone Number: 05/05/2022, 8:09 AM    Transition of Care Department [facility]) has reviewed patient and no TOC needs have been identified at this time. We will continue to monitor patient advancement through interdisciplinary progression rounds. If new patient transition needs arise, please place a TOC consult.    Expected Discharge Plan: Home/Self Care Barriers to Discharge: Continued Medical Work up   Patient Goals and CMS Choice        Expected Discharge Plan and Services Expected Discharge Plan: Home/Self Care   Discharge Planning Services: CM Consult   Living arrangements for the past 2 months: Single Family Home                                      Prior Living Arrangements/Services Living arrangements for the past 2 months: Single Family Home Lives with:: Spouse Patient language and need for interpreter reviewed:: Yes Do you feel safe going back to the place where you live?: Yes            Criminal Activity/Legal Involvement Pertinent to Current Situation/Hospitalization: No - Comment as needed  Activities of Daily Living Home Assistive Devices/Equipment: Cane (specify quad or straight), Eyeglasses, Dentures (specify type), Walker (specify type) ADL Screening (condition at time of admission) Patient's cognitive ability adequate to safely complete daily activities?: No Is the patient deaf or have difficulty hearing?: No Does the patient have difficulty seeing, even when wearing glasses/contacts?: No Does  the patient have difficulty concentrating, remembering, or making decisions?: No Patient able to express need for assistance with ADLs?: Yes Does the patient have difficulty dressing or bathing?: Yes Independently performs ADLs?: No Communication: Independent Dressing (OT): Needs assistance Is this a change from baseline?: Change from baseline, expected to last >3 days Grooming: Independent Feeding: Independent Bathing: Needs assistance Is this a change from baseline?: Change from baseline, expected to last >3 days Toileting: Independent In/Out Bed: Needs assistance Is this a change from baseline?: Change from baseline, expected to last >3 days Walks in Home: Independent with device (comment) Does the patient have difficulty walking or climbing stairs?: Yes Weakness of Legs: Both Weakness of Arms/Hands: Both  Permission Sought/Granted                  Emotional Assessment Appearance:: Appears stated age Attitude/Demeanor/Rapport: Engaged Affect (typically observed): Calm Orientation: : Oriented to Self, Oriented to Place, Oriented to Situation, Oriented to  Time Alcohol / Substance Use: Tobacco Use (quit 6 years ago no etoh or drug use) Psych Involvement: No (comment)  Admission diagnosis:  Orthostatic hypotension [I95.1] Generalized weakness [R53.1] Falls [W19.XXXA] Patient Active Problem List   Diagnosis Date Noted   Generalized weakness 05/04/2022   Falls 05/04/2022   Spells of trembling 49/70/2637   Acute metabolic encephalopathy 85/88/5027   AMS (altered mental status) 03/14/2022   Blurry vision, bilateral 10/08/2021  Leukopenia 10/08/2021   Wound of back 10/06/2021   Hyponatremia 10/03/2021   Cancer-related breakthrough pain 10/02/2021   Encounter for antineoplastic immunotherapy 09/29/2021   Cancer associated pain 09/15/2021   Constipation 09/15/2021   Right-sided chest wall pain 08/29/2021   Malnutrition of moderate degree 08/20/2021   Atrial  tachycardia ([place]) 08/18/2021   Lung cancer ([place]) 07/29/2021   Hemoptysis 06/06/2021   History of pulmonary embolus (PE) 04/29/2021   Normocytic anemia 04/29/2021   Hypotension due to hypovolemia 04/28/2021   Hypomagnesemia 04/08/2021   Port-A-Cath in place 04/07/2021   Protein-calorie malnutrition, moderate ([place]) 03/25/2021   PNA (pneumonia) 03/22/2021   Community acquired pneumonia 03/22/2021   Sepsis ([place]) 03/22/2021   CKD (chronic kidney disease), stage III ([place]) 03/22/2021   Hypoxia 02/28/2021   Dyspnea 02/28/2021   Acute renal failure superimposed on stage 3a chronic kidney disease ([place]) 02/25/2021   AF (paroxysmal atrial fibrillation) ([place]) 02/25/2021   Pleural effusion 02/25/2021   Neutropenia, drug-induced ([place]) 02/24/2021   Positive blood cultures 02/17/2021   Positive blood culture 02/16/2021   Right flank pain 02/09/2021   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[place]) 01/30/2021   Encounter for antineoplastic chemotherapy 01/29/2021   Goals of care, counseling/discussion 01/14/2021   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[place]) 01/14/2021   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[place]) 01/14/2021   Acute back pain with sciatica 01/12/2021   Allergic rhinitis 01/12/2021   Amnesia 01/12/2021   Kidney stone 01/12/2021   Sciatica 01/12/2021   Bipolar disorder ([place]) 01/12/2021   S/P lobectomy of lung 12/24/2020   Solitary lung nodule 11/28/2020   Chest pain 11/07/2019   Gastroesophageal reflux disease    Cervical radiculopathy 10/17/2018   Preoperative clearance 10/04/2018   Palpitations    Coronary artery disease involving coronary bypass graft of native heart with angina pectoris ([place])    Transient loss of consciousness 03/24/2018   Ectopic atrial tachycardia ([place]) 02/09/2018   Central chest pain 03/10/2017   Family hx-stroke 11/10/2016   Stroke-like episode ([place]) - R brain, s/p tPA 11/09/2016   Unstable angina ([place]) 09/07/2016   Claudication of both lower  extremities ([place])    Pure hypercholesterolemia    Tobacco abuse disorder    CAD of autologous artery bypass graft without angina    Chest pain at rest 06/10/2016   Abnormal nuclear stress test - HIGH RISK 04/20/2016   Old MI (myocardial infarction)    Essential hypertension 02/26/2016   Ischemic cardiomyopathy 12/25/2015   Hyperlipidemia LDL goal <70 12/25/2015   Mild tobacco abuse in early remission 11/28/2015   Coronary artery disease involving native coronary artery of native heart with angina pectoris ([place]) 11/28/2015   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   Acute MI anterior wall first episode care [facility])    Precordial chest pain 03/07/2015   Gout attack 03/07/2015   Mixed bipolar I disorder ([place]) 03/07/2015   Fibromyalgia 07/09/2014   Gout 07/09/2014   Anxiety 07/09/2014   Depression 07/09/2014   Hepatitis C 24/40/1027   Eosinophilic esophagitis 25/36/6440   PCP:  [name], MD Pharmacy:   [facility] [place], [address] AT [facility] [address] [place] [place] [address] Phone: [phone] Fax: [phone]  [place] [address] Phone: [phone] Fax: [phone]     Social Determinants of Health (SDOH) Interventions    Readmission Risk Interventions   Row Labels 03/17/2022  12:19 PM 08/21/2021    9:25 AM 08/01/2021    8:41 AM  Readmission Risk Prevention Plan   Section Header. No data exists in this row.     Transportation Screening   Complete Complete Complete  Medication Review Press photographer)   Complete Complete Complete  PCP or Specialist appointment within 3-5 days of discharge   Complete Complete Complete  HRI or Home Care Consult   Complete Complete Complete  SW Recovery Care/Counseling Consult   Complete Complete Complete  SW Consult Not Complete Comments    n/a   Palliative Care Screening   Complete Not Applicable Complete  Skilled [place]   Not Applicable Not Applicable Not Applicable

## 2022-05-05 NOTE — Procedures (Signed)
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  EEG to evaluate for seizure.  Level of alertness: Awake  AEDs during EEG study: None  Technical aspects: This EEG study was done with scalp electrodes positioned according to the 10-20 International system of electrode placement. Electrical activity was reviewed with band pass filter of 1-70Hz , sensitivity of 7 uV/mm, display speed of 36mm/sec with a 60Hz  notched filter applied as appropriate. EEG data were recorded continuously and digitally stored.  Video monitoring was available and reviewed as appropriate.  Description: The posterior dominant rhythm consists of 9 Hz activity of moderate voltage (25-35 uV) seen predominantly in posterior head regions, symmetric and reactive to eye opening and eye closing. EEG showed intermittent generalized 3 to 6 Hz theta-delta slowing. Hyperventilation and photic stimulation were not performed.     ABNORMALITY - Intermittent slow, generalized  IMPRESSION: This study is suggestive of mild diffuse encephalopathy, nonspecific etiology. No seizures or epileptiform discharges were seen throughout the recording.  Jeston Junkins Barbra Sarks

## 2022-05-06 DIAGNOSIS — I25709 Atherosclerosis of coronary artery bypass graft(s), unspecified, with unspecified angina pectoris: Secondary | ICD-10-CM | POA: Diagnosis not present

## 2022-05-06 DIAGNOSIS — R531 Weakness: Secondary | ICD-10-CM | POA: Diagnosis not present

## 2022-05-06 DIAGNOSIS — W19XXXD Unspecified fall, subsequent encounter: Secondary | ICD-10-CM | POA: Diagnosis not present

## 2022-05-06 DIAGNOSIS — I480 Paroxysmal atrial fibrillation: Secondary | ICD-10-CM | POA: Diagnosis not present

## 2022-05-06 MED ORDER — MIDODRINE HCL 2.5 MG PO TABS
2.5000 mg | ORAL_TABLET | Freq: Three times a day (TID) | ORAL | Status: DC
  Administered 2022-05-06 – 2022-05-08 (×6): 2.5 mg via ORAL
  Filled 2022-05-06 (×7): qty 1

## 2022-05-06 MED ORDER — HYDROXYZINE HCL 10 MG PO TABS
10.0000 mg | ORAL_TABLET | Freq: Three times a day (TID) | ORAL | Status: DC | PRN
Start: 2022-05-06 — End: 2022-05-13
  Administered 2022-05-06 – 2022-05-08 (×4): 10 mg via ORAL
  Filled 2022-05-06 (×7): qty 1

## 2022-05-06 NOTE — Progress Notes (Signed)
Pharmacy Brief Note -  Patient is enrolled in a clinical trial at [facility]. Spoke with study coordinator, Neoma Laming who advised to hold Alirocumab while patient is admitted. Information relayed to patient.   Lenis Noon, PharmD 05/06/22 9:30 AM

## 2022-05-06 NOTE — Progress Notes (Signed)
PROGRESS NOTE    [name]  ZOX:[id number] DOB: 11/11/1955 DOA: 05/03/2022 PCP: [name], MD    Chief Complaint  Patient presents with   Weakness    Brief Narrative:   [name] is a 66 y.o. 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 Presenting with falls.   Patient underwent CT Head, Chest, abdomen and pelvis along with cervical spine and results reviewed with the patient.  Neurology consulted, recommended getting EEG, MRI c and thoracic spine.    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 EEG shows mild diffuse encephalopathy,  No seizures or epileptiform discharges were seen throughout the recording. Neurology recommending MRI of the C-spine and thoracic spine for further evaluation.  - daughter reports his symptoms of unresponsiveness and multiple falls are mostly when gets up . He was found to have significant orthostatic hypotension.      Orthostatic hypotension -Persistent with some symptoms today when working with PT.  -Gentle hydration, compression stockings up to the thigh, abdominal binder . - midodrine added, discontinued the ativan and Cymbalta for now.  - recheck orthostatic vital signs tomorrow.     Neuro endocrine tumor Mets to brain, ribs, lung and back S/p whole brain radiation currently on experimental  chemo. Recommend outpatient follow-up with oncology at [facility] Patient enrolled in clinical trial at [facility], The Alirocumab dose was due today, Pharmacy contacted his trial study coordinator this morning who said standard practice is to hold it while admitted.  Discussed with the patient and his daughter over the phone of the above conversation.    PAF  Rate controlled.  On Xarelto for anti coagulation.    CAD s/p PCI, Currently chest pain free.     Anemia of chronic disease:  Transfuse to keep hemoglobin greater than 7.  Currently hemoglobin stable at 10.     Hyponatremia:  Resolved.      DVT prophylaxis: Xarelto.  Code Status: DNR.  Family Communication: none at bedside, discussed the plan with the daughter over the pone.  Disposition:   Status is: Inpatient Remains inpatient appropriate because: further work up for falls and unresponsive spells.    Level of care: Telemetry Consultants:  Neurology.   Procedures: (MRI Cspine and T spine.   Antimicrobials: none.    Subjective: No new complaints this morning.  No spells of unresponsiveness today.   Objective: Vitals:   05/05/22 1645 05/05/22 2008 05/06/22 0015 05/06/22 0559  BP: 109/70 109/71 127/78 114/71  Pulse: 91 93 87 86  Resp: 12 18 18 18   Temp: 98.3 F (36.8 C) 98.1 F (36.7 C) 98.4 F (36.9 C) 98.1 F (36.7 C)  TempSrc: Oral Oral Oral Oral  SpO2:  100% 99% 100%  Weight:      Height:        Intake/Output Summary (Last 24 hours) at 05/06/2022 0914 Last data filed at 05/05/2022 0957 Gross per 24 hour  Intake 0  ml  Output --  Net 0 ml    Filed Weights   05/03/22 2125  Weight: 62.6 kg    Examination:  General exam: Appears calm and comfortable  Respiratory system: Clear to auscultation. Respiratory effort normal. Cardiovascular system: S1 & S2 heard, RRR. No JVD,  No pedal edema. Gastrointestinal system: Abdomen is nondistended, soft and nontender. Normal bowel sounds heard. Central  nervous system: Alert and oriented. No focal neurological deficits. Extremities: Symmetric 5 x 5 power. Skin: No rashes, lesions or ulcers Psychiatry: flat affect.      Data Reviewed: I have personally reviewed following labs and imaging studies  CBC: Recent Labs  Lab 05/03/22 2329 05-12-22 0500  WBC 5.9 4.9  NEUTROABS 4.0  --   HGB 10.0* 10.0*  HCT 30.7* 31.2*  MCV 92.5 94.8  PLT 202 193     Basic Metabolic Panel: Recent Labs  Lab 05/03/22 2329 2022-05-12 0500  NA 131* 138  K 3.7 3.8  CL 99 107  CO2 24 24  GLUCOSE 95 92  BUN 16 11  CREATININE 0.91 0.70  CALCIUM 9.0 9.0     GFR: Estimated Creatinine Clearance: 79.3 mL/min (by C-G formula based on SCr of 0.7 mg/dL).  Liver Function Tests: Recent Labs  Lab 05/03/22 2329 05/12/2022 0500  AST 21 19  ALT 13 13  ALKPHOS 90 88  BILITOT 0.6 0.8  PROT 7.0 6.9  ALBUMIN 3.0* 2.9*     CBG: Recent Labs  Lab 05/03/22 2340  GLUCAP 87      Recent Results (from the past 240 hour(s))  Culture, blood (routine x 2)     Status: None (Preliminary result)   Collection Time: 05/03/22 11:24 PM   Specimen: BLOOD  Result Value Ref Range Status   Specimen Description   Final    BLOOD Performed at [facility], [place] [address] [place], [place] [address]    Special Requests   Final    BOTTLES DRAWN AEROBIC AND ANAEROBIC Blood Culture results may not be optimal due to an excessive volume of blood received in culture bottles Performed at [facility], [address] [place], [place] [address]    Culture   Final    NO GROWTH 3 DAYS Performed at [facility], [place] [address] [place], [place] [address]    Report Status PENDING  Incomplete  Culture, blood (routine x 2)     Status: None (Preliminary result)   Collection Time: 05/04/22  3:19 PM   Specimen: BLOOD  Result Value Ref Range Status   Specimen Description   Final    BLOOD BLOOD LEFT ARM Performed at [facility] [address], [place], [place] [address]    Special Requests   Final    BOTTLES DRAWN AEROBIC ONLY Blood Culture adequate volume Performed at [facility] [address] [place], [place] [address]    Culture   Final    NO GROWTH 2 DAYS Performed at [facility] [address], [place],  [address]    Report Status PENDING  Incomplete         Radiology Studies: EEG adult  Result Date: 05/12/2022 Lora Havens, MD     05/12/22  8:52 AM Patient Name: SUZANNE GARBERS MRN: [id number] Epilepsy Attending: Lora Havens Referring Physician/Provider: Jonnie Finner, DO Date: 05/04/2022 Duration: 23.49 mins Patient history:  66 year old male with 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  EEG to evaluate for seizure. Level of alertness: Awake AEDs during EEG study: None Technical aspects: This EEG study was done with scalp electrodes positioned according to the 10-20 International system of electrode placement. Electrical activity was reviewed with band pass filter of 1-70Hz , sensitivity of 7 uV/mm, display speed of 42m/sec with a 60Hz  notched filter applied as appropriate. EEG data were recorded continuously and digitally stored.  Video monitoring was available and reviewed as appropriate. Description: The posterior dominant rhythm consists of 9 Hz activity of moderate voltage (25-35 uV) seen predominantly in posterior head regions, symmetric and reactive to eye opening and eye closing. EEG showed intermittent generalized 3 to 6 Hz theta-delta slowing. Hyperventilation and photic stimulation were not performed.   ABNORMALITY - Intermittent slow,  generalized IMPRESSION: This study is suggestive of mild diffuse encephalopathy, nonspecific etiology. No seizures or epileptiform discharges were seen throughout the recording. Priyanka OBarbra Sarks       Scheduled Meds:  chlorhexidine gluconate (MEDLINE KIT)  15 mL Mouth Rinse BID   Chlorhexidine Gluconate Cloth  6 each Topical Daily   DULoxetine  30 mg Oral BID   morphine  60 mg Oral Q12H   mouth rinse  15 mL Mouth Rinse 10 times per day   rivaroxaban  20 mg Oral QPM   Continuous Infusions:  sodium chloride 75 mL/hr at 05/05/22 2045     LOS: 2 days        VHosie Poisson MD [facility]   To contact the attending provider between 7A-7P or the covering provider during after hours 7P-7A, please log into the web site [email or web address] and access using universal [place] password for that web site. If you do not have the password, please call the hospital operator.  05/06/2022, 9:14 AM

## 2022-05-07 ENCOUNTER — Other Ambulatory Visit: Payer: Self-pay

## 2022-05-07 ENCOUNTER — Inpatient Hospital Stay (HOSPITAL_COMMUNITY): Payer: Medicare Other

## 2022-05-07 DIAGNOSIS — I25709 Atherosclerosis of coronary artery bypass graft(s), unspecified, with unspecified angina pectoris: Secondary | ICD-10-CM | POA: Diagnosis not present

## 2022-05-07 DIAGNOSIS — C7931 Secondary malignant neoplasm of brain: Secondary | ICD-10-CM

## 2022-05-07 DIAGNOSIS — R531 Weakness: Secondary | ICD-10-CM | POA: Diagnosis not present

## 2022-05-07 DIAGNOSIS — I480 Paroxysmal atrial fibrillation: Secondary | ICD-10-CM | POA: Diagnosis not present

## 2022-05-07 DIAGNOSIS — W19XXXD Unspecified fall, subsequent encounter: Secondary | ICD-10-CM | POA: Diagnosis not present

## 2022-05-07 MED ORDER — GADOBUTROL 1 MMOL/ML IV SOLN
6.0000 mL | Freq: Once | INTRAVENOUS | Status: AC | PRN
  Administered 2022-05-07: 6 mL via INTRAVENOUS

## 2022-05-07 NOTE — Progress Notes (Addendum)
Occupational Therapy Treatment Patient Details Name: [name] MRN: [id number] DOB: 1956-03-15 Today's Date: 05/07/2022   History of present illness [name] is a 66 y.o. male admitted 05/03/22 with weakness over the last several weeks with multiple falls after these falls the patient seems very shaky and has a brief, blank stare. After these episodes he comes to but is confused for 1 to 2 minutes. He states he is no longer able to text due to difficulty with tremor and coordination with the left hand. He is currently undergoing a clinical trial for lung cancer at [facility] and received his second round of chemotherapy. PMHx of atrial fibrillation (on Xarelto), anemia, basal cell carcinoma, CAD s/p CABG, carotid artery disease, ectopic atrial includes tachycardia, hepatitis C (treated and cured per Epic), hypercholesterolemia, renal stones, HTN, ischemic cardiomyopathy, metastatic large cell neuroendocrine carcinoma, s/p whole-brain irradiation for brain metastases one month ago, migraines, small stroke in 2018, symptomatic hypotension and syncope.   OT comments  The pt was found the in the semi-fowler's position, he was oriented x4, and he was motivated to participate in the session. He required supervision for supine to sit & for scooting to the EOB. He further required min guard to stand using RW, as well as to ambulate a couple steps to the bedside chair using the RW, & for stand to sit. While seated in the chair, patient was instructed on simple lower body therapeutic exercises for strengthening & to assist with improved blood circulation, given concern for orthostatic hypotension; he performed appropriate teach back of exercise with supervision seated in the chair. Further education was provided on orthostatic hypotension and potential effects on occupational performance & overall functional independence, and what to do if experiencing associated symptoms. He presented with good overall  participation & will continue to benefit from further OT services to maximize his safety & independence with self-care tasks.    Recommendations for follow up therapy are one component of a multi-disciplinary discharge planning process, led by the attending physician.  Recommendations may be updated based on patient status, additional functional criteria and insurance authorization.    Follow Up Recommendations  Home health OT    Assistance Recommended at Discharge Intermittent Supervision/Assistance  Patient can return home with the following  A little help with walking and/or transfers;A little help with bathing/dressing/bathroom;Assistance with cooking/housework;Assistance with feeding;Direct supervision/assist for medications management;Direct supervision/assist for financial management;Assist for transportation;Help with stairs or ramp for entrance   Equipment Recommendations  BSC/3in1    Recommendations for Other Services      Precautions / Restrictions Precautions Precautions: Fall Precaution Comments: orthostatic Restrictions Weight Bearing Restrictions: No       Mobility Bed Mobility   Bed Mobility: Supine to Sit     Supine to sit: Supervision     General bed mobility comments: HOB slightly elevated Patient Response: Cooperative  Transfers Overall transfer level: Needs assistance Equipment used: Rolling walker (2 wheels) Transfers: Sit to/from Stand Sit to Stand: Min guard           General transfer comment: x2 from EOB, increased use of UEs.     Balance   Sitting-balance support: Feet supported Sitting balance-Leahy Scale: Good       Standing balance-Leahy Scale: Fair                ADL either performed or assessed with clinical judgement      Cognition   Oriented x4, cooperative, pleasant, followed commands without difficulty  Patient was instructed on simple lower body therapeutic exercises for strengthening &  to assist with improved blood circulation, given concern for orthostatic hypotension. He performed appropriate teach back with supervision seated in the chair.   Exercises             General Comments see vitals flowsheet for orthostatics    Pertinent Vitals/ Pain                           Frequency  Min 2X/week        Progress Toward Goals  OT Goals(current goals can now be found in the care plan section)  Progress towards OT goals: Progressing toward goals  Acute Rehab OT Goals Patient Stated Goal: get MRI completed OT Goal Formulation: With patient Time For Goal Achievement: 05/19/22 Potential to Achieve Goals: Good  Plan Discharge plan remains appropriate       AM-PAC OT "6 Clicks" Daily Activity     Outcome Measure   Help from another person eating meals?: A Little Help from another person taking care of personal grooming?: A Little Help from another person toileting, which includes using toliet, bedpan, or urinal?: A Little Help from another person bathing (including washing, rinsing, drying)?: A Little Help from another person to put on and taking off regular upper body clothing?: A Little Help from another person to put on and taking off regular lower body clothing?: A Little 6 Click Score: 18    End of Session Equipment Utilized During Treatment: Gait belt;Rolling walker (2 wheels)  OT Visit Diagnosis: Unsteadiness on feet (R26.81);Muscle weakness (generalized) (M62.81);History of falling (Z91.81);Repeated falls (R29.6);Pain;Other symptoms and signs involving cognitive function;Feeding difficulties (R63.3) Pain - Right/Left: Right Pain - part of body: Hip   Activity Tolerance Patient tolerated treatment well;Other (comment) (limited by orthostatics)   Patient Left in chair;with call bell/phone within reach;with chair alarm set   Nurse Communication Mobility status;Other (comment) (positive for orthostatics)        Time: 3845-3646 OT Time  Calculation (min): 25 min  Charges: OT General Charges $OT Visit: 1 Visit OT Treatments $Self Care/Home Management : 8-22 mins $Therapeutic Activity: 8-22 mins    [name], OTR/L 05/07/2022, 12:02 PM

## 2022-05-07 NOTE — Progress Notes (Signed)
PT Cancellation Note  Patient Details Name: [name] MRN: [id number] DOB: 20-Jun-1956   Cancelled Treatment:      Pt getting an EEG, cervical and Thoracic MRI today.  Will attempt to see another day as schedule permits.  Pt has been evaluated with no post acute PT indicated.    Rica Koyanagi  PTA Acute  Rehabilitation Services Office M-F          [phone] Weekend pager [phone]

## 2022-05-07 NOTE — Progress Notes (Signed)
PROGRESS NOTE    [name]  DUK:[id number] DOB: 06/03/1956 DOA: 05/03/2022 PCP: [name], MD    Chief Complaint  Patient presents with   Weakness    Brief Narrative:   [name] is a 66 y.o. 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 Presenting with falls.   Patient underwent CT Head, Chest, abdomen and pelvis along with cervical spine and results reviewed with the patient.  Neurology consulted, recommended getting EEG, MRI c and thoracic spine.    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 EEG shows mild diffuse encephalopathy,  No seizures or epileptiform discharges were seen throughout the recording. Neurology recommending MRI of the C-spine and thoracic spine for further evaluation, which are pending.  - daughter reports his symptoms of unresponsiveness and multiple falls are mostly when gets up . He was found to have significant orthostatic hypotension.    Orthostatic hypotension -Persistent with some symptoms today when working with PT.  -Gentle hydration, compression stockings up to the thigh, abdominal binder . - midodrine added, discontinued the ativan and Cymbalta for now.  - repeat orthostatics are positive, but he is not as symptomatic as before as per the patient.    Neuro endocrine tumor Mets to brain, ribs, lung and back S/p  whole brain radiation currently on experimental chemo. Recommend outpatient follow-up with oncology at [facility]. Patient enrolled in clinical trial at [facility], The Alirocumab dose was due today, Pharmacy contacted his trial study coordinator this morning who said standard practice is to hold it while admitted.  Discussed with patient and family.    PAF  Rate controlled.  On Xarelto for anti coagulation.    CAD s/p PCI, Currently chest pain free.     Anemia of chronic disease:  Transfuse to keep hemoglobin greater than 7.  Currently hemoglobin stable at 10.     Hyponatremia:  Resolved.      DVT prophylaxis: Xarelto.  Code Status: DNR.  Family Communication: none at bedside, . Discussed the plan with wife over the phone last night.  Disposition:   Status is: Inpatient Remains inpatient appropriate because: further work up for falls and unresponsive spells.    Level of care: Telemetry Consultants:  Neurology.   Procedures: (MRI Cspine and T spine. Pending.   Antimicrobials: none.    Subjective: No complaints.  Working with PT.  No spells over night or this morning.   Objective: Vitals:   05/06/22 2228 05/07/22 0556 05/07/22 0900 05/07/22 1320  BP: (!) 140/86 116/72  108/75  Pulse: 75 78  95  Resp: _0 Temp: 98.6 F (37 C) 98.7 F (37.1 C)  98.6 F (37 C)  TempSrc: Oral Oral  Oral  SpO2: 100% 100% 100% 97%  Weight:      Height:        Intake/Output Summary (Last 24 hours) at 05/07/2022 1451 Last data filed at 05/07/2022 1150 Gross  per 24 hour  Intake 240 ml  Output 1100 ml  Net -860 ml    Filed Weights   05/03/22 2125  Weight: 62.6 kg    Examination:  General exam: Appears calm and comfortable  Respiratory system: Clear to auscultation. Respiratory effort normal. Cardiovascular system: S1 & S2 heard, RRR. No JVD,  No pedal edema. Gastrointestinal system: Abdomen is nondistended, soft and nontender.  Normal bowel sounds heard. Central  nervous system: Alert and oriented. No focal neurological deficits. Extremities: Symmetric 5 x 5 power. Skin: No rashes, lesions or ulcers Psychiatry: Mood & affect appropriate.       Data Reviewed: I have personally reviewed following labs and imaging studies  CBC: Recent Labs  Lab 05/03/22 2329 05/05/22 0500  WBC 5.9 4.9  NEUTROABS 4.0  --   HGB 10.0* 10.0*  HCT 30.7* 31.2*  MCV 92.5 94.8  PLT 202 193     Basic Metabolic Panel: Recent Labs  Lab 05/03/22 2329 05/05/22 0500  NA 131* 138  K 3.7 3.8  CL 99 107  CO2 24 24  GLUCOSE 95 92  BUN 16 11  CREATININE 0.91 0.70  CALCIUM 9.0 9.0     GFR: Estimated Creatinine Clearance: 79.3 mL/min (by C-G formula based on SCr of 0.7 mg/dL).  Liver Function Tests: Recent Labs  Lab 05/03/22 2329 05/05/22 0500  AST 21 19  ALT 13 13  ALKPHOS 90 88  BILITOT 0.6 0.8  PROT 7.0 6.9  ALBUMIN 3.0* 2.9*     CBG: Recent Labs  Lab 05/03/22 2340  GLUCAP 87      Recent Results (from the past 240 hour(s))  Culture, blood (routine x 2)     Status: None (Preliminary result)   Collection Time: 05/03/22 11:24 PM   Specimen: BLOOD  Result Value Ref Range Status   Specimen Description   Final    BLOOD Performed at [facility], [place] [address] [place], [place] [address]    Special Requests   Final    BOTTLES DRAWN AEROBIC AND ANAEROBIC Blood Culture results may not be optimal due to an excessive volume of blood received in culture bottles Performed at [facility], [place] [place], [place] [address]    Culture   Final    NO GROWTH 4 DAYS Performed at [facility], [place] [address], [place], [place] [address]    Report Status PENDING  Incomplete  Culture, blood (routine x 2)     Status: None (Preliminary result)   Collection Time: 05/04/22  3:19 PM   Specimen: BLOOD  Result Value Ref Range Status   Specimen Description   Final    BLOOD BLOOD LEFT ARM Performed at [facility] [address] [place], [place] [address]    Special Requests   Final    BOTTLES DRAWN AEROBIC ONLY Blood Culture adequate volume Performed at [facility] [address], [place], [place] [address]    Culture   Final    NO GROWTH 3 DAYS Performed at [facility], [place] [address], [place], [place] [address]    Report Status PENDING  Incomplete         Radiology Studies: No results found.      Scheduled Meds:  chlorhexidine gluconate (MEDLINE KIT)  15 mL Mouth Rinse BID   Chlorhexidine Gluconate Cloth  6 each Topical Daily   midodrine  2.5 mg Oral TID WC  morphine  60 mg Oral Q12H   mouth rinse  15 mL Mouth Rinse 10 times per day   rivaroxaban  20 mg Oral QPM   Continuous Infusions:  sodium chloride 75 mL/hr at 05/06/22 1059     LOS: 3 days        Hosie Poisson, MD [facility]   To contact the attending provider between 7A-7P or the covering provider during after hours 7P-7A, please log into the web site [email or web address] and access using universal [place] password for that web site. If you do not have the password, please call the hospital operator.  05/07/2022, 2:51 PM

## 2022-05-08 DIAGNOSIS — W19XXXD Unspecified fall, subsequent encounter: Secondary | ICD-10-CM | POA: Diagnosis not present

## 2022-05-08 DIAGNOSIS — I480 Paroxysmal atrial fibrillation: Secondary | ICD-10-CM | POA: Diagnosis not present

## 2022-05-08 DIAGNOSIS — I25709 Atherosclerosis of coronary artery bypass graft(s), unspecified, with unspecified angina pectoris: Secondary | ICD-10-CM | POA: Diagnosis not present

## 2022-05-08 DIAGNOSIS — R531 Weakness: Secondary | ICD-10-CM | POA: Diagnosis not present

## 2022-05-08 LAB — CULTURE, BLOOD (ROUTINE X 2): Culture: NO GROWTH

## 2022-05-08 LAB — CBC WITH DIFFERENTIAL/PLATELET
Abs Immature Granulocytes: 0 10*3/uL (ref 0.00–0.07)
Basophils Absolute: 0 10*3/uL (ref 0.0–0.1)
Basophils Relative: 1 %
Eosinophils Absolute: 0.3 10*3/uL (ref 0.0–0.5)
Eosinophils Relative: 7 %
HCT: 31.4 % — ABNORMAL LOW (ref 39.0–52.0)
Hemoglobin: 10 g/dL — ABNORMAL LOW (ref 13.0–17.0)
Immature Granulocytes: 0 %
Lymphocytes Relative: 19 %
Lymphs Abs: 1 10*3/uL (ref 0.7–4.0)
MCH: 30.5 pg (ref 26.0–34.0)
MCHC: 31.8 g/dL (ref 30.0–36.0)
MCV: 95.7 fL (ref 80.0–100.0)
Monocytes Absolute: 0.6 10*3/uL (ref 0.1–1.0)
Monocytes Relative: 12 %
Neutro Abs: 3.1 10*3/uL (ref 1.7–7.7)
Neutrophils Relative %: 61 %
Platelets: 217 10*3/uL (ref 150–400)
RBC: 3.28 MIL/uL — ABNORMAL LOW (ref 4.22–5.81)
RDW: 14.1 % (ref 11.5–15.5)
WBC: 5.1 10*3/uL (ref 4.0–10.5)
nRBC: 0 % (ref 0.0–0.2)

## 2022-05-08 LAB — BASIC METABOLIC PANEL
Anion gap: 5 (ref 5–15)
BUN: 7 mg/dL — ABNORMAL LOW (ref 8–23)
CO2: 24 mmol/L (ref 22–32)
Calcium: 8.6 mg/dL — ABNORMAL LOW (ref 8.9–10.3)
Chloride: 110 mmol/L (ref 98–111)
Creatinine, Ser: 0.69 mg/dL (ref 0.61–1.24)
GFR, Estimated: >60 mL/min (ref >60)
Glucose, Bld: 97 mg/dL (ref 70–99)
Potassium: 3.3 mmol/L — ABNORMAL LOW (ref 3.5–5.1)
Sodium: 139 mmol/L (ref 135–145)

## 2022-05-08 MED ORDER — POTASSIUM CHLORIDE CRYS ER 20 MEQ PO TBCR
40.0000 meq | EXTENDED_RELEASE_TABLET | Freq: Once | ORAL | Status: AC
  Administered 2022-05-08: 40 meq via ORAL
  Filled 2022-05-08: qty 2

## 2022-05-08 MED ORDER — MIDODRINE HCL 5 MG PO TABS
5.0000 mg | ORAL_TABLET | Freq: Three times a day (TID) | ORAL | Status: DC
Start: 2022-05-08 — End: 2022-05-11
  Administered 2022-05-08 – 2022-05-11 (×7): 5 mg via ORAL
  Filled 2022-05-08 (×10): qty 1

## 2022-05-08 MED ORDER — HYDROMORPHONE HCL 4 MG PO TABS
4.0000 mg | ORAL_TABLET | ORAL | Status: DC | PRN
  Administered 2022-05-08 – 2022-05-13 (×12): 4 mg via ORAL
  Filled 2022-05-08 (×13): qty 1

## 2022-05-08 NOTE — Progress Notes (Signed)
Inpatient Rehab Admissions:  Inpatient Rehab Consult received.  I spoke with pt's wife [name] on the telephone for rehabilitation assessment and to discuss goals and expectations of an inpatient rehab admission.  Discussed average length of stay, discharge home after completion of program, Medicare A/B coverage of CIR. [name] would like to discuss rehab options (as PT recommending SNF and she recently had a medical procedure) with doctor before making a decision. Will continue to follow.  Signed: Gayland Curry, [place], [place] Admissions Coordinator [phone]

## 2022-05-08 NOTE — Progress Notes (Signed)
PROGRESS NOTE    [name]  XQJ:[id number] DOB: 1956-08-15 DOA: 05/03/2022 PCP: [name], MD    Chief Complaint  Patient presents with   Weakness    Brief Narrative:   [name] is a 66 y.o. 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 Presenting with falls.   Patient underwent CT Head, Chest, abdomen and pelvis along with cervical spine and results reviewed with the patient.  Neurology consulted, recommended getting EEG, MRI Cervical and thoracic spine.    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 EEG shows mild diffuse encephalopathy,  No seizures or epileptiform discharges were seen throughout the recording. Neurology recommending MRI of the C-spine and thoracic spine for further evaluation.  Discussed the results of the MRI of the C spine and thoracic spine with the patient and daughter on the phone.   - daughter reports his symptoms of unresponsiveness and multiple falls are mostly when gets up . He was found to have significant orthostatic hypotension which would explain his dizzy spells. .    Orthostatic hypotension -Persistent hypotension on standing, with dizziness.  -Gentle hydration, compression stockings up to the thigh, abdominal binder . - midodrine added, discontinued the ativan and Cymbalta for now. Increased the dose of midodrine  to 5 mg TID.  - repeat orthostatics are positive, but he is not as symptomatic as before as per the patient.  - suspect probably from autonomic dysfunction from chemotherapy? - will request cardiology consult in am.  - meanwhile get Carotid duplex.    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 Recommend outpatient follow-up with oncology at [facility]. Patient enrolled in clinical trial at [facility], The Alirocumab dose was due today, Pharmacy contacted his trial study coordinator this morning who said standard practice is to hold it while admitted.  Discussed the results of the MRI with the daughter and patient, unable to open films from February.    PAF  Rate controlled.  On Xarelto for anti coagulation.    CAD s/p PCI, Currently chest pain free.     Anemia of chronic disease:  Transfuse to keep hemoglobin greater than 7.  Currently hemoglobin stable at 10.     Hyponatremia:  Resolved.   Hypokalemia:  Replaced.      DVT prophylaxis: Xarelto.  Code Status: DNR.  Family Communication: none at bedside, . Discussed the plan with daughter over the phone today.  Disposition:   Status is: Inpatient Remains inpatient appropriate because: further work up for falls and unresponsive spells.    Level of care: Telemetry Consultants:  Neurology.   Procedures: (MRI Cspine and T spine. Pending.   Antimicrobials: none.    Subjective: Wants to go home, persistently orthostatic.   Objective: Vitals:   05/07/22 1320 05/07/22 1925 05/08/22 0522 05/08/22 1310  BP: 108/75 102/69 112/74 (!) 94/57  Pulse: 95 84 78 92  Resp: 16 18 18 18   Temp: 98.6 F (37 C) 98.6 F (37 C) 98.8 F (37.1 C) 98.9 F (37.2 C)  TempSrc: Oral Oral Oral Oral  SpO2: 97% 100% 100% 96%  Weight:      Height:        Intake/Output Summary (Last 24 hours) at 05/08/2022 1617 Last data filed at 05/08/2022 1047 Gross per 24 hour  Intake  120 ml  Output 450 ml  Net -330 ml    Filed Weights   05/03/22 2125  Weight: 62.6 kg    Examination:  General exam: Appears calm and comfortable  Respiratory system: Clear to auscultation. Respiratory effort normal. Cardiovascular system: S1 & S2 heard, RRR. No JVD, No pedal edema. Gastrointestinal system: Abdomen is nondistended, soft and nontender.  Normal bowel sounds heard. Central nervous system: Alert and oriented. No focal neurological deficits. Extremities: Symmetric 5 x 5 power. Skin: No rashes, lesions or ulcers Psychiatry:  Mood & affect appropriate.        Data Reviewed: I have personally reviewed following labs and imaging studies  CBC: Recent Labs  Lab 05/03/22 2329 05/05/22 0500 05/08/22 0457  WBC 5.9 4.9 5.1  NEUTROABS 4.0  --  3.1  HGB 10.0* 10.0* 10.0*  HCT 30.7* 31.2* 31.4*  MCV 92.5 94.8 95.7  PLT 202 193 217     Basic Metabolic Panel: Recent Labs  Lab 05/03/22 2329 05/05/22 0500 05/08/22 0457  NA 131* 138 139  K 3.7 3.8 3.3*  CL 99 107 110  CO2 24 24 24   GLUCOSE 95 92 97  BUN 16 11 7*  CREATININE 0.91 0.70 0.69  CALCIUM 9.0 9.0 8.6*     GFR: Estimated Creatinine Clearance: 79.3 mL/min (by C-G formula based on SCr of 0.69 mg/dL).  Liver Function Tests: Recent Labs  Lab 05/03/22 2329 05/05/22 0500  AST 21 19  ALT 13 13  ALKPHOS 90 88  BILITOT 0.6 0.8  PROT 7.0 6.9  ALBUMIN 3.0* 2.9*     CBG: Recent Labs  Lab 05/03/22 2340  GLUCAP 87      Recent Results (from the past 240 hour(s))  Culture, blood (routine x 2)     Status: None   Collection Time: 05/03/22 11:24 PM   Specimen: BLOOD  Result Value Ref Range Status   Specimen Description   Final    BLOOD Performed at [facility], [place] [address], [place], [place] [address]    Special Requests   Final    BOTTLES DRAWN AEROBIC AND ANAEROBIC Blood Culture results may not be optimal due to an excessive volume of blood received in culture bottles Performed  at [facility], [address] [place], [place] [address]    Culture   Final    NO GROWTH 5 DAYS Performed at [facility], [place] [address], [place], [place] [address]    Report Status 05/08/2022 FINAL  Final  Culture, blood (routine x 2)     Status: None (Preliminary result)   Collection Time: 05/04/22  3:19 PM   Specimen: BLOOD  Result Value Ref Range Status   Specimen Description   Final    BLOOD BLOOD LEFT ARM Performed at [facility] [address] [place], [place] [address]    Special Requests   Final    BOTTLES DRAWN AEROBIC ONLY Blood Culture adequate volume Performed at [facility] [address], [place],  [address]    Culture  Final    NO GROWTH 4 DAYS Performed at [facility], [place] [address] [place], [place] [address]    Report Status PENDING  Incomplete         Radiology Studies: MR THORACIC SPINE W WO CONTRAST  Result Date: 05/07/2022 CLINICAL DATA:  Ataxia.  Nontraumatic.  Non-small cell lung cancer. EXAM: MRI THORACIC WITHOUT AND WITH CONTRAST TECHNIQUE: Multiplanar and multiecho pulse sequences of the thoracic spine were obtained without and with intravenous contrast. CONTRAST:  42m GADAVIST GADOBUTROL 1 MMOL/ML IV SOLN COMPARISON:  Vertebral body heights are maintained. A 12 mm T2 hyperintense enhancing lesion is present in the inferior aspect of the L1 vertebral body. No other discrete osseous lesions present the spine. FINDINGS: Alignment: No significant listhesis is present. Thoracic kyphosis is within normal limits. Mild leftward curvature is centered at T9, stable. Vertebrae: A 12 mm peripherally enhancing lesion is present in the right inferior aspect of L1. Other enhancing lesions are present within the thoracic spine. Vertebral body heights are maintained. Cord:  Normal signal and morphology. Paraspinal and other soft tissues: Enhancing paraspinous soft tissue present right side of  the spine T8-9 T12-L1. T11 posterior rib fracture is better seen on CT. A right pleural effusion enhances. Numerous pulmonary nodules are again noted bilaterally. The largest in the left lung measuring up to 19 mm. Disc levels: Shallow right paramedian disc protrusion is present without significant stenosis or change. Mild right foraminal narrowing is present at the same level. No other significant disc protrusions or central canal stenosis is present. The foramina are patent bilaterally. IMPRESSION: 1. 12 mm peripherally enhancing lesion in the right inferior aspect of L1 is concerning for metastatic disease to the thoracic spine. 2. Enhancing paraspinous soft tissue right side of the spine at T8-9 T12-L1 likely represents metastatic disease to the paraspinous soft tissues. 3. Right T11 posterior rib fracture is better seen on CT. 4. Numerous pulmonary nodules bilaterally compatible with metastatic disease. 5. Shallow right paramedian disc protrusion at T9-10 without significant stenosis or change. Electronically Signed   By: CSan MorelleM.D.   On: 05/07/2022 15:37   MR CERVICAL SPINE W WO CONTRAST  Result Date: 05/07/2022 CLINICAL DATA:  Ataxia, cervical pathology EXAM: MRI CERVICAL SPINE WITHOUT AND WITH CONTRAST TECHNIQUE: Multiplanar and multiecho pulse sequences of the cervical spine, to include the craniocervical junction and cervicothoracic junction, were obtained without and with intravenous contrast. CONTRAST:  649mGADAVIST GADOBUTROL 1 MMOL/ML IV SOLN COMPARISON:  None Available. FINDINGS: Alignment: Minimal anterolisthesis of C3 on C4. Vertebrae: No acute fracture, evidence of discitis, or aggressive bone lesion. Cord: Normal signal and morphology. Posterior Fossa, vertebral arteries, paraspinal tissues: Posterior fossa demonstrates no focal abnormality. Vertebral artery flow voids are maintained. Paraspinal soft tissues are unremarkable. Disc levels: Discs: Anterior cervical fusion at C7-T1.  Degenerative disease with mild disc height loss at C4-5 and C5-6. C2-3: Small central disc protrusion. Bilateral foraminal stenosis. No central canal stenosis. C3-4: Broad-based disc bulge with a small central disc protrusion. Moderate-severe right foraminal stenosis. Moderate left foraminal stenosis. Mild spinal stenosis. C4-5: Mild broad-based disc bulge. Moderate right facet arthropathy. Moderate-severe left foraminal stenosis. Moderate right foraminal stenosis. No spinal stenosis. C5-6: Small right paracentral disc protrusion. No foraminal or central canal stenosis. C6-7: Mild broad-based disc bulge. No foraminal or central canal stenosis. C7-T1: Interbody fusion. No neural foraminal stenosis. No central canal stenosis. IMPRESSION: 1. At C3-4 there is a broad-based disc bulge with a small central disc protrusion. Moderate-severe  right foraminal stenosis. Moderate left foraminal stenosis. Mild spinal stenosis. 2. At C4-5 there is a mild broad-based disc bulge. Moderate right facet arthropathy. Moderate-severe left foraminal stenosis. Moderate right foraminal stenosis. 3. Anterior cervical fusion at C7-T1 without foraminal or central canal stenosis. Electronically Signed   By: Kathreen Devoid M.D.   On: 05/07/2022 15:23        Scheduled Meds:  chlorhexidine gluconate (MEDLINE KIT)  15 mL Mouth Rinse BID   Chlorhexidine Gluconate Cloth  6 each Topical Daily   midodrine  5 mg Oral TID WC   morphine  60 mg Oral Q12H   mouth rinse  15 mL Mouth Rinse 10 times per day   rivaroxaban  20 mg Oral QPM   Continuous Infusions:     LOS: 4 days        Hosie Poisson, MD [facility]   To contact the attending provider between 7A-7P or the covering provider during after hours 7P-7A, please log into the web site [email or web address] and access using universal [place] password for that web site. If you do not have the password, please call the hospital operator.  05/08/2022, 4:17 PM

## 2022-05-08 NOTE — Progress Notes (Addendum)
Physical Therapy Treatment Patient Details Name: [name] MRN: [id number] DOB: 07-27-1956 Today's Date: 05/08/2022   History of Present Illness [name] is a 66 y.o. male admitted 05/03/22 with weakness over the last several weeks with multiple falls after these falls the patient seems very shaky and has a brief, blank stare. After these episodes he comes to but is confused for 1 to 2 minutes. He states he is no longer able to text due to difficulty with tremor and coordination with the left hand. He is currently undergoing a clinical trial for lung cancer at [facility] and received his second round of chemotherapy. PMHx of atrial fibrillation (on Xarelto), anemia, basal cell carcinoma, CAD s/p CABG, carotid artery disease, ectopic atrial includes tachycardia, hepatitis C (treated and cured per Epic), hypercholesterolemia, renal stones, HTN, ischemic cardiomyopathy, metastatic large cell neuroendocrine carcinoma, s/p whole-brain irradiation for brain metastases one month ago, migraines, small stroke in 2018, symptomatic hypotension and syncope.    PT Comments    Pt transferred bed to recliner with min hand held assistance. Assessed orthostatic vitals, pt had a 31 point systolic drop between supine and standing (supine 112/72, sitting 96/70, standing 81/61) and was dizzy in standing. He was unable to tolerate standing for 3 minutes.  Pt has an abdominal binder in the room but it is much too big for him, and his TED hose were soiled with urine, so neither were utilized this session. RN aware and will order new TED hose and a binder. Pt did have 2 doses of midodrine this morning prior to PT. Pt performed seated BUE/LE exercises for strengthening. Pt's wife and 2 daughters stated they no longer feel they can manage pt's care at home, pt's wife had a recent spinal fusion surgery. Family would like to pursue ST-SNF.    Recommendations for follow up therapy are one component of a multi-disciplinary  discharge planning process, led by the attending physician.  Recommendations may be updated based on patient status, additional functional criteria and insurance authorization.  Follow Up Recommendations  Skilled nursing-short term rehab (<3 hours/day) Can patient physically be transported by private vehicle: Yes   Assistance Recommended at Discharge Intermittent Supervision/Assistance  Patient can return home with the following A little help with walking and/or transfers;A little help with bathing/dressing/bathroom;Assistance with cooking/housework;Help with stairs or ramp for entrance   Equipment Recommendations  None recommended by PT    Recommendations for Other Services       Precautions / Restrictions Precautions Precautions: Fall Precaution Comments: orthostatic Restrictions Weight Bearing Restrictions: No     Mobility  Bed Mobility Overal bed mobility: Modified Independent Bed Mobility: Supine to Sit     Supine to sit: Modified independent (Device/Increase time), HOB elevated     General bed mobility comments: HOB slightly elevated, used rail    Transfers Overall transfer level: Needs assistance Equipment used: 1 person hand held assist Transfers: Sit to/from Stand, Bed to chair/wheelchair/BSC Sit to Stand: Min assist   Step pivot transfers: Min assist       General transfer comment: assist to power up and to steady in standing, pt reported dizziness and was unsteady in standing. See flowsheets for orthostatics, pt had 31 point systolic drop supine to stand, could not tolerate standing for 3 minutes.    Ambulation/Gait                   Chief Strategy Officer  Modified Rankin (Stroke Patients Only)       Balance   Sitting-balance support: Feet supported Sitting balance-Leahy Scale: Good     Standing balance support: Single extremity supported Standing balance-Leahy Scale: Poor Standing balance comment: single  UE support 2* dizziness                            Cognition Arousal/Alertness: Awake/alert Behavior During Therapy: WFL for tasks assessed/performed Overall Cognitive Status: Within Functional Limits for tasks assessed                                          Exercises General Exercises - Upper Extremity Shoulder Flexion: AROM, Both, 20 reps, Seated General Exercises - Lower Extremity Ankle Circles/Pumps: AROM, Both, 20 reps, Seated Long Arc Quad: AROM, 20 reps, Seated Hip Flexion/Marching: AROM, Both, 20 reps, Seated    General Comments        Pertinent Vitals/Pain Pain Assessment Pain Score: 5  Pain Location: L ribs Pain Descriptors / Indicators: Grimacing Pain Intervention(s): Limited activity within patient's tolerance, Monitored during session, Premedicated before session    Home Living                          Prior Function            PT Goals (current goals can now be found in the care plan section) Acute Rehab PT Goals Patient Stated Goal: figure out what is going on and learn tools/strategies to help maintain independence PT Goal Formulation: With patient Time For Goal Achievement: 05/19/22 Potential to Achieve Goals: Good Progress towards PT goals: Progressing toward goals    Frequency    Min 3X/week      PT Plan Discharge plan needs to be updated (family stated they can't provide needed level of care)    Co-evaluation              AM-PAC PT "6 Clicks" Mobility   Outcome Measure  Help needed turning from your back to your side while in a flat bed without using bedrails?: None Help needed moving from lying on your back to sitting on the side of a flat bed without using bedrails?: None Help needed moving to and from a bed to a chair (including a wheelchair)?: A Little Help needed standing up from a chair using your arms (e.g., wheelchair or bedside chair)?: A Little Help needed to walk in hospital  room?: A Lot Help needed climbing 3-5 steps with a railing? : A Lot 6 Click Score: 18    End of Session Equipment Utilized During Treatment: Gait belt Activity Tolerance: Patient limited by fatigue;Treatment limited secondary to medical complications (Comment) (orthostatic) Patient left: in chair;with call bell/phone within reach;with chair alarm set;with family/visitor present Nurse Communication: Mobility status;Other (comment) (abdominal binder in room is much much too large for pt, TED hose were soiled in urine so not able to use them) PT Visit Diagnosis: Unsteadiness on feet (R26.81);Muscle weakness (generalized) (M62.81);Repeated falls (R29.6)     Time: 5732-2025 PT Time Calculation (min) (ACUTE ONLY): 32 min  Charges:  $Therapeutic Exercise: 8-22 mins $Therapeutic Activity: 8-22 mins                     Blondell Reveal Kistler PT 05/08/2022  Acute Rehabilitation Services  Office  [phone]   

## 2022-05-09 ENCOUNTER — Encounter (HOSPITAL_COMMUNITY): Payer: Medicare Other

## 2022-05-09 DIAGNOSIS — W19XXXD Unspecified fall, subsequent encounter: Secondary | ICD-10-CM | POA: Diagnosis not present

## 2022-05-09 DIAGNOSIS — I951 Orthostatic hypotension: Secondary | ICD-10-CM | POA: Diagnosis not present

## 2022-05-09 DIAGNOSIS — E871 Hypo-osmolality and hyponatremia: Secondary | ICD-10-CM | POA: Diagnosis not present

## 2022-05-09 DIAGNOSIS — R531 Weakness: Secondary | ICD-10-CM | POA: Diagnosis not present

## 2022-05-09 LAB — CULTURE, BLOOD (ROUTINE X 2)
Culture: NO GROWTH
Special Requests: ADEQUATE

## 2022-05-09 MED ORDER — SODIUM CHLORIDE 0.9 % IV SOLN
12.5000 mg | Freq: Four times a day (QID) | INTRAVENOUS | Status: AC | PRN
  Administered 2022-05-09 – 2022-05-10 (×2): 12.5 mg via INTRAVENOUS
  Filled 2022-05-09 (×2): qty 12.5

## 2022-05-09 NOTE — NC FL2 (Signed)
[place] LEVEL OF CARE SCREENING TOOL     IDENTIFICATION  Patient Name: [name] Birthdate: Jun 07, 1956 Sex: male Admission Date (Current Location): 05/03/2022  [facility] and [place] Number:  Herbalist and Address:  [facility],  [place], [place]      Provider Number: [id number]  Attending Physician Name and Address:  Hosie Poisson, MD  Relative Name and Phone Number:   [name] Pesantez(spouse)[phone])    Current Level of Care: Hospital Recommended Level of Care: [place] Prior Approval Number:    Date Approved/Denied:   PASRR Number:  [id number])  Discharge Plan: SNF    Current Diagnoses: Patient Active Problem List   Diagnosis Date Noted   Generalized weakness 05/04/2022   Falls 05/04/2022   Spells of trembling 02/54/2706   Acute metabolic encephalopathy 23/76/2831   AMS (altered mental status) 03/14/2022   Blurry vision, bilateral 10/08/2021   Leukopenia 10/08/2021   Wound of back 10/06/2021   Hyponatremia 10/03/2021   Cancer-related breakthrough pain 10/02/2021   Encounter for antineoplastic immunotherapy 09/29/2021   Cancer associated pain 09/15/2021   Constipation 09/15/2021   Right-sided chest wall pain 08/29/2021   Malnutrition of moderate degree 08/20/2021   Atrial tachycardia ([place]) 08/18/2021   Lung cancer ([place]) 07/29/2021   Hemoptysis 06/06/2021   History of pulmonary embolus (PE) 04/29/2021   Normocytic anemia 04/29/2021   Hypotension due to hypovolemia 04/28/2021   Hypomagnesemia 04/08/2021   Port-A-Cath in place 04/07/2021   Protein-calorie malnutrition, moderate ([place]) 03/25/2021   PNA (pneumonia) 03/22/2021   Community acquired pneumonia 03/22/2021   Sepsis ([place]) 03/22/2021   CKD (chronic kidney disease), stage III ([place]) 03/22/2021   Hypoxia 02/28/2021   Dyspnea 02/28/2021   Acute renal failure superimposed on stage 3a chronic kidney disease ([place]) 02/25/2021    AF (paroxysmal atrial fibrillation) ([place]) 02/25/2021   Pleural effusion 02/25/2021   Neutropenia, drug-induced ([place]) 02/24/2021   Positive blood cultures 02/17/2021   Positive blood culture 02/16/2021   Right flank pain 02/09/2021   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[place]) 01/30/2021   Encounter for antineoplastic chemotherapy 01/29/2021   Goals of care, counseling/discussion 01/14/2021   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/2021   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[place]) 01/14/2021   Acute back pain with sciatica 01/12/2021   Allergic rhinitis 01/12/2021   Amnesia 01/12/2021   Kidney stone 01/12/2021   Sciatica 01/12/2021   Bipolar disorder ([place]) 01/12/2021   S/P lobectomy of lung 12/24/2020   Solitary lung nodule 11/28/2020   Chest pain 11/07/2019   Gastroesophageal reflux disease    Cervical radiculopathy 10/17/2018   Preoperative clearance 10/04/2018   Palpitations    Coronary artery disease involving coronary bypass graft of native heart with angina pectoris ([place])    Transient loss of consciousness 03/24/2018   Ectopic atrial tachycardia ([place]) 02/09/2018   Central chest pain 03/10/2017   Family hx-stroke 11/10/2016   Stroke-like episode ([place]) - R brain, s/p tPA 11/09/2016   Unstable angina ([place]) 09/07/2016   Claudication of both lower extremities ([place])    Pure hypercholesterolemia    Tobacco abuse disorder    CAD of autologous artery bypass graft without angina    Chest pain at rest 06/10/2016   Abnormal nuclear stress test - HIGH RISK 04/20/2016   Old MI (myocardial infarction)    Essential hypertension 02/26/2016   Ischemic cardiomyopathy 12/25/2015   Hyperlipidemia LDL goal <70 12/25/2015   Mild tobacco abuse  in early remission 11/28/2015   Coronary artery disease involving native coronary artery of native heart with angina pectoris ([place]) 11/28/2015   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   Acute MI anterior wall first episode care [facility])     Precordial chest pain 03/07/2015   Gout attack 03/07/2015   Mixed bipolar I disorder ([place]) 03/07/2015   Fibromyalgia 07/09/2014   Gout 07/09/2014   Anxiety 07/09/2014   Depression 07/09/2014   Hepatitis C 17/61/6073   Eosinophilic esophagitis 71/01/2693    Orientation RESPIRATION BLADDER Height & Weight     Self, Time, Situation, Place  Normal Incontinent Weight: 62.6 kg Height:  _0  (175.3 cm)  BEHAVIORAL SYMPTOMS/MOOD NEUROLOGICAL BOWEL NUTRITION STATUS      Incontinent Diet (Regular)  AMBULATORY STATUS COMMUNICATION OF NEEDS Skin   Limited Assist Verbally Normal                       Personal Care Assistance Level of Assistance  Bathing, Feeding, Dressing Bathing Assistance: Limited assistance Feeding assistance: Limited assistance Dressing Assistance: Limited assistance     Functional Limitations Info  Sight, Hearing, Speech Sight Info: Impaired (eyeglasses) Hearing Info: Adequate Speech Info: Impaired (dentures-top/bottom(doesn't wear))    SPECIAL CARE FACTORS FREQUENCY  PT (By licensed PT), OT (By licensed OT)     PT Frequency:  (5x week) OT Frequency:  (5x week)            Contractures Contractures Info: Not present    Additional Factors Info  Code Status, Allergies Code Status Info:  (DNR) Allergies Info:  (Prednisone, Tetanus Toxoids, Wellbutrin (Bupropion), Varenicline)           Current Medications (05/09/2022):  This is the current hospital active medication list Current Facility-Administered Medications  Medication Dose Route Frequency Provider Last Rate Last Admin   benzonatate (TESSALON) capsule 100 mg  100 mg Oral TID PRN Marylyn Ishihara, Tyrone A, DO       bisacodyl (DULCOLAX) EC tablet 5 mg  5 mg Oral Daily PRN Marylyn Ishihara, Tyrone A, DO       chlorhexidine gluconate (MEDLINE KIT) (PERIDEX) 0.12 % solution 15 mL  15 mL Mouth Rinse BID Kyle, Tyrone A, DO   15 mL at 05/09/22 0759   Chlorhexidine Gluconate Cloth 2 % PADS 6 each  6 each Topical Daily  Kyle, Tyrone A, DO   6 each at 05/09/22 1011   HYDROmorphone (DILAUDID) injection 1 mg  1 mg Intravenous Q3H PRN Hosie Poisson, MD   1 mg at 05/08/22 1333   HYDROmorphone (DILAUDID) tablet 4 mg  4 mg Oral Q4H PRN Hosie Poisson, MD   4 mg at 05/09/22 0758   hydrOXYzine (ATARAX) tablet 10 mg  10 mg Oral TID PRN Hosie Poisson, MD   10 mg at 05/08/22 0459   midodrine (PROAMATINE) tablet 5 mg  5 mg Oral TID WC Hosie Poisson, MD   5 mg at 05/09/22 0759   morphine (MS CONTIN) 12 hr tablet 60 mg  60 mg Oral Q12H Hosie Poisson, MD   60 mg at 05/09/22 1011   ondansetron (ZOFRAN) tablet 4 mg  4 mg Oral Q6H PRN Marylyn Ishihara, Tyrone A, DO       Or   ondansetron (ZOFRAN) injection 4 mg  4 mg Intravenous Q6H PRN Marylyn Ishihara, Tyrone A, DO   4 mg at 05/09/22 0210   Oral care mouth rinse  15 mL Mouth Rinse 10 times per day Cherylann Ratel A, DO   15  mL at 05/09/22 1012   rivaroxaban (XARELTO) tablet 20 mg  20 mg Oral QPM Kyle, Tyrone A, DO   20 mg at 05/08/22 1706     Discharge Medications: Please see discharge summary for a list of discharge medications.  Relevant Imaging Results:  Relevant Lab Results:   Additional Information  [id number])  Brock Larmon, Juliann Pulse, RN

## 2022-05-09 NOTE — TOC Initial Note (Signed)
Transition of Care [facility]) - Initial/Assessment Note    Patient Details  Name: [name] MRN: [id number] Date of Birth: 05-15-1956  [facility] CM/SW Contact:    Dessa Phi, RN Phone Number: 05/09/2022, 11:01 AM  Clinical Narrative: Spoke to patient/spouse about d/c plans-agree to ST SNF-faxed out await bed offers. Spouse had concerns about post d/c from rehab-informed her that the facility will asst with her needs once at rehab-she is unable to provide care/transportation. Also noted oncology services to be managed in otpt setting-spoue already has cancer med @ home,cancer center has transportation services-she is aware to contact them if additional services needed post d/c-Julie voiced understanding. Await bed offers.                 Expected Discharge Plan: Skilled Nursing Facility Barriers to Discharge: Continued Medical Work up   Patient Goals and CMS Choice Patient states their goals for this hospitalization and ongoing recovery are::  (Rehab) CMS Medicare.gov Compare Post Acute Care list provided to:: Patient Choice offered to / list presented to : Patient, Spouse  Expected Discharge Plan and Services Expected Discharge Plan: [place]   Discharge Planning Services: CM Consult Post Acute Care Choice: [place] Living arrangements for the past 2 months: Single Family Home                                      Prior Living Arrangements/Services Living arrangements for the past 2 months: Single Family Home Lives with:: Spouse Patient language and need for interpreter reviewed:: Yes Do you feel safe going back to the place where you live?: Yes      Need for Family Participation in Patient Care: Yes (Comment) Care giver support system in place?: Yes (comment) Current home services: DME (cane,rw) Criminal Activity/Legal Involvement Pertinent to Current Situation/Hospitalization: No - Comment as needed  Activities of  Daily Living Home Assistive Devices/Equipment: Cane (specify quad or straight), Eyeglasses, Dentures (specify type), Walker (specify type) ADL Screening (condition at time of admission) Patient's cognitive ability adequate to safely complete daily activities?: No Is the patient deaf or have difficulty hearing?: No Does the patient have difficulty seeing, even when wearing glasses/contacts?: No Does the patient have difficulty concentrating, remembering, or making decisions?: No Patient able to express need for assistance with ADLs?: Yes Does the patient have difficulty dressing or bathing?: Yes Independently performs ADLs?: No Communication: Independent Dressing (OT): Needs assistance Is this a change from baseline?: Change from baseline, expected to last >3 days Grooming: Independent Feeding: Independent Bathing: Needs assistance Is this a change from baseline?: Change from baseline, expected to last >3 days Toileting: Independent In/Out Bed: Needs assistance Is this a change from baseline?: Change from baseline, expected to last >3 days Walks in Home: Independent with device (comment) Does the patient have difficulty walking or climbing stairs?: Yes Weakness of Legs: Both Weakness of Arms/Hands: Both  Permission Sought/Granted Permission sought to share information with : Case Manager Permission granted to share information with : Yes, Verbal Permission Granted  Share Information with NAME:  (Case manager)           Emotional Assessment Appearance:: Appears stated age Attitude/Demeanor/Rapport: Engaged Affect (typically observed): Accepting Orientation: : Oriented to Self, Oriented to Place, Oriented to  Time, Oriented to Situation Alcohol / Substance Use: Not Applicable Psych Involvement: No (comment)  Admission diagnosis:  Orthostatic hypotension [I95.1] Generalized  weakness [R53.1] Falls [W19.XXXA] Patient Active Problem List   Diagnosis Date Noted   Generalized  weakness 05/04/2022   Falls 05/04/2022   Spells of trembling 09/73/5329   Acute metabolic encephalopathy 92/42/6834   AMS (altered mental status) 03/14/2022   Blurry vision, bilateral 10/08/2021   Leukopenia 10/08/2021   Wound of back 10/06/2021   Hyponatremia 10/03/2021   Cancer-related breakthrough pain 10/02/2021   Encounter for antineoplastic immunotherapy 09/29/2021   Cancer associated pain 09/15/2021   Constipation 09/15/2021   Right-sided chest wall pain 08/29/2021   Malnutrition of moderate degree 08/20/2021   Atrial tachycardia ([place]) 08/18/2021   Lung cancer ([place]) 07/29/2021   Hemoptysis 06/06/2021   History of pulmonary embolus (PE) 04/29/2021   Normocytic anemia 04/29/2021   Hypotension due to hypovolemia 04/28/2021   Hypomagnesemia 04/08/2021   Port-A-Cath in place 04/07/2021   Protein-calorie malnutrition, moderate ([place]) 03/25/2021   PNA (pneumonia) 03/22/2021   Community acquired pneumonia 03/22/2021   Sepsis ([place]) 03/22/2021   CKD (chronic kidney disease), stage III ([place]) 03/22/2021   Hypoxia 02/28/2021   Dyspnea 02/28/2021   Acute renal failure superimposed on stage 3a chronic kidney disease ([place]) 02/25/2021   AF (paroxysmal atrial fibrillation) ([place]) 02/25/2021   Pleural effusion 02/25/2021   Neutropenia, drug-induced ([place]) 02/24/2021   Positive blood cultures 02/17/2021   Positive blood culture 02/16/2021   Right flank pain 02/09/2021   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[place]) 01/14/2021   Acute back pain with sciatica 01/12/2021   Allergic rhinitis 01/12/2021   Amnesia 01/12/2021   Kidney stone 01/12/2021   Sciatica 01/12/2021   Bipolar disorder ([place]) 01/12/2021   S/P lobectomy of lung 12/24/2020   Solitary lung nodule  11/28/2020   Chest pain 11/07/2019   Gastroesophageal reflux disease    Cervical radiculopathy 10/17/2018   Preoperative clearance 10/04/2018   Palpitations    Coronary artery disease involving coronary bypass graft of native heart with angina pectoris ([place])    Transient loss of consciousness 03/24/2018   Ectopic atrial tachycardia ([place]) 02/09/2018   Central chest pain 03/10/2017   Family hx-stroke 11/10/2016   Stroke-like episode ([place]) - R brain, s/p tPA 11/09/2016   Unstable angina ([place]) 09/07/2016   Claudication of both lower extremities ([place])    Pure hypercholesterolemia    Tobacco abuse disorder    CAD of autologous artery bypass graft without angina    Chest pain at rest 06/10/2016   Abnormal nuclear stress test - HIGH RISK 04/20/2016   Old MI (myocardial infarction)    Essential hypertension 02/26/2016   Ischemic cardiomyopathy 12/25/2015   Hyperlipidemia LDL goal <70 12/25/2015   Mild tobacco abuse in early remission 11/28/2015   Coronary artery disease involving native coronary artery of native heart with angina pectoris ([place]) 11/28/2015   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   Acute MI anterior wall first episode care [facility])    Precordial chest pain 03/07/2015   Gout attack 03/07/2015   Mixed bipolar I disorder ([place]) 03/07/2015   Fibromyalgia 07/09/2014   Gout 07/09/2014   Anxiety 07/09/2014   Depression 07/09/2014   Hepatitis C 19/62/2297   Eosinophilic esophagitis 98/92/1194   PCP:  [name], MD Pharmacy:   [id number] - [place], [address]  [place] [address] Phone: [phone] Fax: [phone]  [place] [address] Phone: [phone] Fax: [phone]     Social Determinants of Health (SDOH) Interventions    Readmission Risk Interventions    05/09/2022   10:48 AM 03/17/2022   12:19 PM 08/21/2021    9:25 AM   Readmission Risk Prevention Plan  Transportation Screening Complete Complete Complete  Medication Review ([place]) Complete Complete Complete  PCP or Specialist appointment within 3-5 days of discharge Complete Complete Complete  HRI or Home Care Consult Complete Complete Complete  SW Recovery Care/Counseling Consult Complete Complete Complete  SW Consult Not Complete Comments   n/a  Palliative Care Screening Not Applicable Complete Not [place] Complete Not Applicable Not Applicable

## 2022-05-09 NOTE — Progress Notes (Signed)
PROGRESS NOTE    [name]  INO:[id number] DOB: 06-19-1956 DOA: 05/03/2022 PCP: [name], MD    Chief Complaint  Patient presents with   Weakness    Brief Narrative:   [name] is a 66 y.o. 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 Presenting with falls.   Patient underwent CT Head, Chest, abdomen and pelvis along with cervical spine and results reviewed with the patient.  Neurology consulted, recommended getting EEG, MRI Cervical and thoracic spine.    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 EEG shows mild diffuse encephalopathy,  No seizures or epileptiform discharges were seen throughout the recording. Neurology recommending MRI of the C-spine and thoracic spine for further evaluation.  Discussed the results of the MRI of the C spine and thoracic spine with the patient and daughter on the phone.   - daughter reports his symptoms of unresponsiveness and multiple falls are mostly when gets up . He was found to have significant orthostatic hypotension which would explain his dizzy spells. .    Orthostatic hypotension -Persistent hypotension on standing, with dizziness.  -Gentle hydration, compression stockings up to the thigh, abdominal binder . - midodrine added, discontinued the ativan and Cymbalta for now. Increased the dose of midodrine  to 5 mg TID.  Recheck orthostatics in am.  - suspect probably from autonomic dysfunction from chemotherapy? - will request cardiology consult in am.  - carotid duplex less than a year is wnl.    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 Recommend outpatient follow-up with oncology at [facility]. Patient enrolled in clinical trial at [facility], The Alirocumab dose was due today, Pharmacy contacted his trial study coordinator this morning who said standard practice is to hold it while admitted.  Discussed the results of the MRI with the daughter and patient, unable to open films from February.    PAF  Rate controlled.  On Xarelto for anti coagulation.    CAD s/p PCI, Currently chest pain free.     Anemia of chronic disease:  Transfuse to keep hemoglobin greater than 7.  Currently hemoglobin stable at 10.     Hyponatremia:  Resolved.   Hypokalemia:  Replaced.  Repeat levels wnl.    Some nausea and an episode of dry heaving earlier this am.  No vomiting. Able to tolerate diet without any issues.  Continue to monitor.      DVT prophylaxis: Xarelto.  Code Status: DNR.  Family Communication: none at bedside, . Discussed the plan with daughter on phone. Disposition:   Status is: Inpatient Remains inpatient appropriate because: further work up for falls and unresponsive spells.    Level of care: Telemetry Consultants:  Neurology.   Procedures: (MRI Cspine and T spine. Pending.   Antimicrobials: none.    Subjective: Dry heaving.  No chest pain or sob.   Objective: Vitals:  05/08/22 0522 05/08/22 1310 05/08/22 2027 05/09/22 0534  BP: 112/74 (!) 94/57 106/68 109/65  Pulse: 78 92 80 73  Resp: _0 Temp: 98.8 F (37.1 C) 98.9 F (37.2 C) 98.8 F (37.1 C) 98.2 F (36.8 C)  TempSrc: Oral Oral Oral Oral  SpO2: 100% 96% 100% 98%  Weight:      Height:        Intake/Output Summary  (Last 24 hours) at 05/09/2022 1535 Last data filed at 05/09/2022 1133 Gross per 24 hour  Intake 120 ml  Output 750 ml  Net -630 ml    Filed Weights   05/03/22 2125  Weight: 62.6 kg    Examination:  General exam: Appears calm and comfortable  Respiratory system: Clear to auscultation. Respiratory effort normal. Cardiovascular system: S1 & S2 heard, RRR. No JVD,  Gastrointestinal system: Abdomen is nondistended, soft and nontender. Normal bowel sounds heard. Central nervous system: Alert and oriented. No focal neurological deficits. Extremities: Symmetric 5 x 5 power. Skin: No rashes, lesions or ulcers Psychiatry: Mood & affect appropriate.         Data Reviewed: I have personally reviewed following labs and imaging studies  CBC: Recent Labs  Lab 05/03/22 2329 05/05/22 0500 05/08/22 0457  WBC 5.9 4.9 5.1  NEUTROABS 4.0  --  3.1  HGB 10.0* 10.0* 10.0*  HCT 30.7* 31.2* 31.4*  MCV 92.5 94.8 95.7  PLT 202 193 217     Basic Metabolic Panel: Recent Labs  Lab 05/03/22 2329 05/05/22 0500 05/08/22 0457  NA 131* 138 139  K 3.7 3.8 3.3*  CL 99 107 110  CO2 _1 GLUCOSE 95 92 97  BUN 16 11 7*  CREATININE 0.91 0.70 0.69  CALCIUM 9.0 9.0 8.6*     GFR: Estimated Creatinine Clearance: 79.3 mL/min (by C-G formula based on SCr of 0.69 mg/dL).  Liver Function Tests: Recent Labs  Lab 05/03/22 2329 05/05/22 0500  AST 21 19  ALT 13 13  ALKPHOS 90 88  BILITOT 0.6 0.8  PROT 7.0 6.9  ALBUMIN 3.0* 2.9*     CBG: Recent Labs  Lab 05/03/22 2340  GLUCAP 87      Recent Results (from the past 240 hour(s))  Culture, blood (routine x 2)     Status: None   Collection Time: 05/03/22 11:24 PM   Specimen: BLOOD  Result Value Ref Range Status   Specimen Description   Final    BLOOD Performed at [facility], [place] [address], [place], [place] [address]    Special Requests   Final    BOTTLES DRAWN AEROBIC AND ANAEROBIC Blood Culture results may not be  optimal due to an excessive volume of blood received in culture bottles Performed at [facility], [place] [place], [place] [address]    Culture   Final    NO GROWTH 5 DAYS Performed at [facility], [place] [address], [place], [place] [address]    Report Status 05/08/2022 FINAL  Final  Culture, blood (routine x 2)     Status: None   Collection Time: 05/04/22  3:19 PM   Specimen: BLOOD  Result Value Ref Range Status   Specimen Description   Final    BLOOD BLOOD LEFT ARM Performed at [facility] [address] [place], [place] [address]    Special Requests   Final    BOTTLES DRAWN AEROBIC ONLY Blood Culture adequate volume  Performed at [facility], [place] [address] [place], [place] [address]    Culture   Final    NO GROWTH 5 DAYS Performed at [facility], [place] [address], [place], [place] [address]    Report Status 05/09/2022 FINAL  Final         Radiology Studies: No results found.      Scheduled Meds:  chlorhexidine gluconate (MEDLINE KIT)  15 mL Mouth Rinse BID   Chlorhexidine Gluconate Cloth  6 each Topical Daily   midodrine  5 mg Oral TID WC   morphine  60 mg Oral Q12H   mouth rinse  15 mL Mouth Rinse 10 times per day   rivaroxaban  20 mg Oral QPM   Continuous Infusions:     LOS: 5 days        Hosie Poisson, MD [facility]   To contact the attending provider between 7A-7P or the covering provider during after hours 7P-7A, please log into the web site [email or web address] and access using universal [place] password for that web site. If you do not have the password, please call the hospital operator.  05/09/2022, 3:35 PM

## 2022-05-10 ENCOUNTER — Inpatient Hospital Stay (HOSPITAL_COMMUNITY): Payer: Medicare Other

## 2022-05-10 DIAGNOSIS — I2583 Coronary atherosclerosis due to lipid rich plaque: Secondary | ICD-10-CM

## 2022-05-10 DIAGNOSIS — W19XXXD Unspecified fall, subsequent encounter: Secondary | ICD-10-CM | POA: Diagnosis not present

## 2022-05-10 DIAGNOSIS — I25709 Atherosclerosis of coronary artery bypass graft(s), unspecified, with unspecified angina pectoris: Secondary | ICD-10-CM | POA: Diagnosis not present

## 2022-05-10 DIAGNOSIS — R55 Syncope and collapse: Secondary | ICD-10-CM | POA: Diagnosis not present

## 2022-05-10 DIAGNOSIS — I2510 Atherosclerotic heart disease of native coronary artery without angina pectoris: Secondary | ICD-10-CM | POA: Diagnosis not present

## 2022-05-10 DIAGNOSIS — I480 Paroxysmal atrial fibrillation: Secondary | ICD-10-CM | POA: Diagnosis not present

## 2022-05-10 DIAGNOSIS — R531 Weakness: Secondary | ICD-10-CM | POA: Diagnosis not present

## 2022-05-10 DIAGNOSIS — I951 Orthostatic hypotension: Secondary | ICD-10-CM | POA: Diagnosis not present

## 2022-05-10 LAB — BASIC METABOLIC PANEL
Anion gap: 7 (ref 5–15)
BUN: 15 mg/dL (ref 8–23)
CO2: 26 mmol/L (ref 22–32)
Calcium: 8.9 mg/dL (ref 8.9–10.3)
Chloride: 106 mmol/L (ref 98–111)
Creatinine, Ser: 0.72 mg/dL (ref 0.61–1.24)
GFR, Estimated: >60 mL/min (ref >60)
Glucose, Bld: 112 mg/dL — ABNORMAL HIGH (ref 70–99)
Potassium: 3.7 mmol/L (ref 3.5–5.1)
Sodium: 139 mmol/L (ref 135–145)

## 2022-05-10 MED ORDER — HYDROMORPHONE HCL 1 MG/ML IJ SOLN
1.0000 mg | INTRAMUSCULAR | Status: DC | PRN
  Administered 2022-05-10 – 2022-05-11 (×4): 1.5 mg via INTRAVENOUS
  Filled 2022-05-10 (×4): qty 1.5

## 2022-05-10 MED ORDER — SODIUM CHLORIDE 0.9 % IV SOLN
12.5000 mg | Freq: Four times a day (QID) | INTRAVENOUS | Status: AC | PRN
  Administered 2022-05-10 (×2): 12.5 mg via INTRAVENOUS
  Filled 2022-05-10 (×2): qty 12.5

## 2022-05-10 NOTE — Progress Notes (Signed)
PROGRESS NOTE    [name]  BPZ:[id number] DOB: [birth year] DOA: 05/03/2022 PCP: [name], MD    Chief Complaint  Patient presents with   Weakness    Brief Narrative:   [name] is a 66 y.o. 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 Presenting with falls.   Patient underwent CT Head, Chest, abdomen and pelvis along with cervical spine and results reviewed with the patient.  Neurology consulted, recommended getting EEG, MRI Cervical and thoracic spine.    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 EEG shows mild diffuse encephalopathy,  No seizures or epileptiform discharges were seen throughout the recording. Neurology recommending MRI of the C-spine and thoracic spine for further evaluation.  Discussed the results of the MRI of the C spine and thoracic spine with the patient and daughter on the phone.   - daughter reports his symptoms of unresponsiveness and multiple falls are mostly when gets up . He was found to have significant orthostatic hypotension which would explain his dizzy spells. .    Orthostatic hypotension -Persistent hypotension on standing, with dizziness.  -Gentle hydration, compression stockings up to the thigh, abdominal binder . - midodrine added, discontinued the ativan and Cymbalta for now. Increased the dose of midodrine  to 5 mg TID.  Recheck orthostatics in am.  - suspect probably from autonomic dysfunction from chemotherapy? - request cardiology consulted for recommendations.  - carotid duplex less than a year is wnl.    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 Recommend outpatient follow-up with oncology at [facility]. Patient enrolled in clinical trial at [facility], The Alirocumab dose was due today, Pharmacy contacted his trial study coordinator this morning who said standard practice is to hold it while admitted.  Discussed the results of the MRI with the daughter and patient, unable to open films from February.    PAF  Rate controlled.  On Xarelto for anti coagulation.    CAD s/p PCI, Currently chest pain free.     Anemia of chronic disease:  Transfuse to keep hemoglobin greater than 7.  Currently hemoglobin stable at 10.     Hyponatremia:  Resolved.   Hypokalemia:  Replaced.  Recheck today.   Some nausea and an episode of dry heaving earlier this am.  Abd x rays ordered for vomiting.      DVT prophylaxis: Xarelto.  Code Status: DNR.  Family Communication: none at bedside, . Discussed the plan with daughter on phone. Disposition:   Status is: Inpatient Remains inpatient appropriate because: waiting for SNF.    Level of care: Telemetry Consultants:  Neurology.  CARDIOLOGY  Procedures: MRI c and thoracic spine.   Antimicrobials: none.    Subjective: Nausea and vomiting this morning.   Objective: Vitals:   05/09/22 0534 05/09/22 1537 05/09/22 2114 05/10/22 0455  BP: 109/65 (!) 90/56 107/64 118/81  Pulse:  73 86 65 89  Resp: _0 Temp: 98.2 F (36.8 C) 99.2 F (37.3 C) 99.1 F (37.3 C) 98.2 F (36.8 C)  TempSrc: Oral Oral Oral Oral  SpO2: 98% 97% 98% 97%  Weight:      Height:        Intake/Output Summary (Last 24 hours) at 05/10/2022 1241 Last data filed at 05/10/2022 0300 Gross  per 24 hour  Intake 290 ml  Output 600 ml  Net -310 ml    Filed Weights   05/03/22 2125  Weight: 62.6 kg    Examination:  General exam: Appears calm and comfortable  Respiratory system: Clear to auscultation. Respiratory effort normal. Cardiovascular system: S1 & S2 heard, RRR. No JVD,  No pedal edema. Gastrointestinal system: Abdomen is nondistended, soft and nontender.  Normal bowel sounds heard. Central nervous system: Alert and oriented. No focal neurological deficits. Extremities: Symmetric 5 x 5 power. Skin: No rashes, lesions or ulcers Psychiatry: Mood & affect appropriate.          Data Reviewed: I have personally reviewed following labs and imaging studies  CBC: Recent Labs  Lab 05/03/22 2329 05/05/22 0500 05/08/22 0457  WBC 5.9 4.9 5.1  NEUTROABS 4.0  --  3.1  HGB 10.0* 10.0* 10.0*  HCT 30.7* 31.2* 31.4*  MCV 92.5 94.8 95.7  PLT 202 193 217     Basic Metabolic Panel: Recent Labs  Lab 05/03/22 2329 05/05/22 0500 05/08/22 0457  NA 131* 138 139  K 3.7 3.8 3.3*  CL 99 107 110  CO2 _1 GLUCOSE 95 92 97  BUN 16 11 7*  CREATININE 0.91 0.70 0.69  CALCIUM 9.0 9.0 8.6*     GFR: Estimated Creatinine Clearance: 79.3 mL/min (by C-G formula based on SCr of 0.69 mg/dL).  Liver Function Tests: Recent Labs  Lab 05/03/22 2329 05/05/22 0500  AST 21 19  ALT 13 13  ALKPHOS 90 88  BILITOT 0.6 0.8  PROT 7.0 6.9  ALBUMIN 3.0* 2.9*     CBG: Recent Labs  Lab 05/03/22 2340  GLUCAP 87      Recent Results (from the past 240 hour(s))  Culture, blood (routine x 2)     Status: None   Collection Time: 05/03/22 11:24 PM   Specimen: BLOOD  Result Value Ref Range Status   Specimen Description   Final    BLOOD Performed at [facility], [place] [address], [place], [place] [address]    Special Requests   Final    BOTTLES DRAWN AEROBIC AND ANAEROBIC Blood Culture results may not be optimal due to an excessive volume of blood received in  culture bottles Performed at [facility], [address] [place], [place] [address]    Culture   Final    NO GROWTH 5 DAYS Performed at [facility], [place] [address], [place], [place] [address]    Report Status 05/08/2022 FINAL  Final  Culture, blood (routine x 2)     Status: None   Collection Time: 05/04/22  3:19 PM   Specimen: BLOOD  Result Value Ref Range Status   Specimen Description   Final    BLOOD BLOOD LEFT ARM Performed at [place] [address] [place], [place] [address]    Special Requests   Final    BOTTLES DRAWN AEROBIC ONLY Blood Culture adequate volume Performed at [place] [address] [place], [place]  [address]    Culture   Final    NO GROWTH 5 DAYS Performed at [facility], [place] [address] [place], [place] [address]    Report Status 05/09/2022 FINAL  Final         Radiology Studies: DG Abd 2 Views  Result Date: 05/10/2022 CLINICAL DATA:  Nausea, vomiting. EXAM: ABDOMEN - 2 VIEW COMPARISON:  February 10, 2021. FINDINGS: The bowel gas pattern is normal. There is no evidence of free air. Small nonobstructive right renal calculus is noted. IMPRESSION: Small nonobstructive right renal calculus. No abnormal bowel dilatation is noted. Electronically Signed   By: Marijo Conception M.D.   On: 05/10/2022 09:27        Scheduled Meds:  chlorhexidine gluconate (MEDLINE KIT)  15 mL Mouth Rinse BID   Chlorhexidine Gluconate Cloth  6 each Topical Daily   midodrine  5 mg Oral TID WC   morphine  60 mg Oral Q12H   mouth rinse  15 mL Mouth Rinse 10 times per day   rivaroxaban  20 mg Oral QPM   Continuous Infusions:  promethazine (PHENERGAN) injection (IM or IVPB)        LOS: 6 days        Hosie Poisson, MD [facility]   To contact the attending provider between 7A-7P or the covering provider during after hours 7P-7A, please log into the web site [email or web address] and access using  universal  password for that web site. If you do not have the password, please call the hospital operator.  05/10/2022, 12:41 PM

## 2022-05-10 NOTE — Consult Note (Signed)
Cardiology Consultation   Patient ID: [name] MRN: [id number]; DOB: 01/20/1956  Admit date: 05/03/2022 Date of Consult: 05/10/2022  PCP:  [name], MD   [place] Providers Cardiologist:  Larae Grooms, MD   Patient Profile:   KYLLE Villa is a 66 y.o. 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 Karleen Hampshire.  History of Present Illness:   [name] underwent emergent CABG in 2017 in the setting of anterior STEMI. Since then, he reprots multiple episodes of chest pain and has had multiple heart catheterizations. LHC 09/2017 with PCI to RCA due to narrowing of the graft of the distal RCA. He contracted COVID 09/2020 but was not hospitalized. Chest CT 09/2020 in the ER for chest pain concerning for primary bronchogenic carcinoma. Workup revealed stage IV large cell carcinoma with neuroendocrine features with brain mets. Converted to Afib RVR following RUL lobectomy 11/2020. [place] was deferred as he converted to NSR with amiodarone prior to discharge. Follow up heart monitor did not show any Afib. He was admitted 03/2021 with acute PE and started on xarelto and ASA was stopped. He was tolerating plavix and xarelto at follow up with Dr. Irish Lack 05/13/21.  He has chronic anemia and orthostatic hypotension.  He has received whole brain radiation and started a chemotherapy trial with [facility] 03/2022. Unfortunately, he has started falling over the last several weeks complicated by tremors. He is also noted to be "spacing out" with some of the falls. Daughters brought him to [facility] for evaluation. Neurology was consulted. EEG showed mild diffuse  encephalopathy, no seizure activity.  MRI C spine and thoracic spine  He is also significantly orthostatic here. Suspect possible autonomic dysfunction from chemotherapy. Midodrine was added and cardiology consulted. Thigh high compression stockings and abdominal binder have been recommended. He was also started on 5 mg midodrine TID yesterday.   During my interview, he describes syncope and falls that do not always correlate with recent position change. He denies chest pain and shortness of breath. He reports falling about 10 times in the last month. He reports going to the bathroom this morning was associated with less dizziness, midodrine may be helping.     Past Medical History:  Diagnosis Date   Anemia    Anxiety    Arthritis    Basal cell carcinoma (BCC) of forehead    CAD (coronary artery disease)    a. 10/2015 ant STEMI >> LHC with 3 v CAD; oLAD tx with POBA >> emergent CABG. b. Multiple evals since that time, early graft failure of SVG-RCA by cath 03/2016. c. 2/19 PCI/DES x1 to pRCA, normal EF.   Carotid artery disease ([place])    a. 40-59% BICA 02/2018.   Depression    Dyspnea    Ectopic atrial tachycardia (HCC)    Esophageal reflux    eosinophil esophagitis   Family history of adverse reaction to anesthesia    "sister has PONV" (06/21/2017)   Former tobacco use    Gout    Hepatitis C    "treated and cured" (06/21/2017)   High cholesterol    History of blood transfusion    History of kidney stones    Hypertension  Ischemic cardiomyopathy    a. EF 25-30% at intraop TEE 4/17  //  b. Limited Echo 5/17 - EF 45-50%, mild ant HK. c. EF 55-65% by cath 09/2017.   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[place])    Migraine    "3-4/yr" (06/21/2017)   Myocardial infarction ([place]) 10/2015   Palpitations    Pneumonia    Sinus bradycardia    a. HR dropping into 40s in 02/2016 -> BB reduced.   Stroke [facility]) 10/2016   "small one; sometimes my memory/cognitive issues" (06/21/2017)    Symptomatic hypotension    a. 02/2016 ER visit -> meds reduced.   Syncope    Wears dentures    Wears glasses     Past Surgical History:  Procedure Laterality Date   25 GAUGE PARS PLANA VITRECTOMY WITH 20 GAUGE MVR PORT  12/31/2020   Procedure: 25 GAUGE PARS PLANA VITRECTOMY WITH 20 GAUGE MVR PORT;  Surgeon: [name], MD;  Location: [place];  Service: Thoracic;;   ANTERIOR CERVICAL DECOMP/DISCECTOMY FUSION N/A 10/17/2018   Procedure: Anterior Cervical Decompression Fusion - Cervical seven -Thoracic one;  Surgeon: Consuella Lose, MD;  Location: [place];  Service: Neurosurgery;  Laterality: N/A;   BASAL CELL CARCINOMA EXCISION     "forehead   BIOPSY  07/20/2019   Procedure: BIOPSY;  Surgeon: Carol Ada, MD;  Location: [facility];  Service: Endoscopy;;   BIOPSY OF MEDIASTINAL MASS Right 07/29/2021   Procedure: RIGHT CHEST WALL MASS BIOPSY;  Surgeon: [name], MD;  Location: [place];  Service: Thoracic;  Laterality: Right;   BRONCHIAL BIOPSY  12/24/2020   Procedure: BRONCHIAL BIOPSIES;  Surgeon: Garner Nash, DO;  Location: [place] [facility];  Service: Pulmonary;;   BRONCHIAL BRUSHINGS  12/24/2020   Procedure: BRONCHIAL BRUSHINGS;  Surgeon: Garner Nash, DO;  Location: [place];  Service: Pulmonary;;   BRONCHIAL NEEDLE ASPIRATION BIOPSY  12/24/2020   Procedure: BRONCHIAL NEEDLE ASPIRATION BIOPSIES;  Surgeon: Garner Nash, DO;  Location: [place];  Service: Pulmonary;;   CARDIAC CATHETERIZATION N/A 11/28/2015   Procedure: Left Heart Cath and Coronary Angiography;  Surgeon: [name], MD;  Location: [place] CV LAB;  Service: Cardiovascular;  Laterality: N/A;   CARDIAC CATHETERIZATION N/A 11/28/2015   Procedure: Coronary Balloon Angioplasty;  Surgeon: [name], MD;  Location: [place] CV [facility];  Service: Cardiovascular;  Laterality: N/A;  ostial LAD   CARDIAC CATHETERIZATION N/A 11/28/2015   Procedure: Coronary/Graft Angiography;   Surgeon: [name], MD;  Location: [place] CV [facility];  Service: Cardiovascular;  Laterality: N/A;  coronaries only    CARDIAC CATHETERIZATION N/A 04/21/2016   Procedure: Left Heart Cath and Coronary Angiography;  Surgeon: Wellington Hampshire, MD;  Location: [place] CV [facility];  Service: Cardiovascular;  Laterality: N/A;   CARDIAC CATHETERIZATION N/A 06/14/2016   Procedure: Left Heart Cath and Cors/Grafts Angiography;  Surgeon: Lorretta Harp, MD;  Location: [place] CV LAB;  Service: Cardiovascular;  Laterality: N/A;   CARDIAC CATHETERIZATION N/A 09/08/2016   Procedure: Left Heart Cath and Cors/Grafts Angiography;  Surgeon: Wellington Hampshire, MD;  Location: [place] [facility];  Service: Cardiovascular;  Laterality: N/A;   CARDIAC CATHETERIZATION     CORONARY ARTERY BYPASS GRAFT N/A 11/28/2015   Procedure: CORONARY ARTERY BYPASS GRAFTING (CABG) TIMES FIVE USING LEFT INTERNAL MAMMARY ARTERY AND RIGHT GREATER SAPHENOUS,VIEN HARVEATED BY ENDOVIEN, INTRAOPPRATIVE TEE;  Surgeon: Gaye Pollack, MD;  Location: [place];  Service: Open Heart Surgery;  Laterality:  N/A;   CORONARY STENT INTERVENTION N/A 10/05/2017   Procedure: CORONARY STENT INTERVENTION;  Surgeon: [name], MD;  Location: [place] [facility];  Service: Cardiovascular;  Laterality: N/A;   ESOPHAGOGASTRODUODENOSCOPY (EGD) WITH PROPOFOL N/A 07/20/2019   Procedure: ESOPHAGOGASTRODUODENOSCOPY (EGD) WITH PROPOFOL;  Surgeon: Carol Ada, MD;  [facility];  Service: Endoscopy;  Laterality: N/A;   ESOPHAGOGASTRODUODENOSCOPY (EGD) WITH PROPOFOL N/A 01/30/2021   Procedure: ESOPHAGOGASTRODUODENOSCOPY (EGD) WITH PROPOFOL;  Surgeon: Carol Ada, MD;  [facility];  Service: Endoscopy;  Laterality: N/A;   FIDUCIAL MARKER PLACEMENT  12/24/2020   Procedure: FIDUCIAL DYE MARKER PLACEMENT;  Surgeon: Garner Nash, DO;  Location: [place] [facility];  Service: Pulmonary;;   HUMERUS SURGERY Right 1969   "tumor inside bone;  filled it w/bone chips"   INTERCOSTAL NERVE BLOCK Right 12/24/2020   Procedure: INTERCOSTAL NERVE BLOCK;  Surgeon: [name], MD;  Location: [place];  Service: Thoracic;  Laterality: Right;   IR IMAGING GUIDED PORT INSERTION  02/06/2021   LEFT HEART CATH AND CORS/GRAFTS ANGIOGRAPHY N/A 03/11/2017   Procedure: Left Heart Cath and Cors/Grafts Angiography;  Surgeon: Leonie Man, MD;  Location: [place] [facility];  Service: Cardiovascular;  Laterality: N/A;   LEFT HEART CATH AND CORS/GRAFTS ANGIOGRAPHY N/A 10/05/2017   Procedure: LEFT HEART CATH AND CORS/GRAFTS ANGIOGRAPHY;  Surgeon: [name], MD;  Location: [place] [facility];  Service: Cardiovascular;  Laterality: N/A;   LEFT HEART CATH AND CORS/GRAFTS ANGIOGRAPHY N/A 04/11/2019   Procedure: LEFT HEART CATH AND CORS/GRAFTS ANGIOGRAPHY;  Surgeon: [name], MD;  Location: [place] CV [facility];  Service: Cardiovascular;  Laterality: N/A;   NODE DISSECTION Right 12/24/2020   Procedure: NODE DISSECTION;  Surgeon: [name], MD;  Location: [place];  Service: Thoracic;  Laterality: Right;   PERIPHERAL VASCULAR CATHETERIZATION N/A 06/14/2016   Procedure: Lower Extremity Angiography;  Surgeon: Lorretta Harp, MD;  Location: [place] [facility];  Service: Cardiovascular;  Laterality: N/A;   VIDEO BRONCHOSCOPY WITH ENDOBRONCHIAL NAVIGATION Right 12/24/2020   Procedure: VIDEO BRONCHOSCOPY WITH ENDOBRONCHIAL NAVIGATION;  Surgeon: Garner Nash, DO;  Location: [place];  Service: Pulmonary;  Laterality: Right;   VIDEO BRONCHOSCOPY WITH ENDOBRONCHIAL ULTRASOUND N/A 12/24/2020   Procedure: VIDEO BRONCHOSCOPY WITH ENDOBRONCHIAL ULTRASOUND;  Surgeon: Garner Nash, DO;  Location: [place];  Service: Pulmonary;  Laterality: N/A;   VIDEO BRONCHOSCOPY WITH INSERTION OF INTERBRONCHIAL VALVE (IBV) N/A 12/31/2020   Procedure: VIDEO BRONCHOSCOPY WITH INSERTION OF INTERBRONCHIAL VALVE (IBV).VALVE IN CARTRIDGE 5mm,9mm. CHEST  TUBE PLACEMENT.;  Surgeon: [name], MD;  [facility];  Service: Thoracic;  Laterality: N/A;   VIDEO BRONCHOSCOPY WITH INSERTION OF INTERBRONCHIAL VALVE (IBV) N/A 07/29/2021   Procedure: VIDEO BRONCHOSCOPY WITH REMOVAL OF INTERBRONCHIAL VALVE (IBV);  Surgeon: [name], MD;  Location: [facility];  Service: Thoracic;  Laterality: N/A;     Home Medications:  Prior to Admission medications   Medication Sig Start Date End Date Taking? Authorizing Provider  acetaminophen (TYLENOL) 500 MG tablet Take 1,000 mg by mouth every 6 (six) hours as needed for mild pain, fever or headache.   Yes [provider]  benzonatate (TESSALON) 100 MG capsule Take 1 capsule (100 mg total) by mouth 3 (three) times daily as needed for cough. TAKE 1 CAPSULE(100 MG) BY MOUTH THREE TIMES DAILY AS NEEDED FOR COUGH Strength: 100 mg 03/23/22  Yes Heilingoetter, Cassandra L, PA-C  bisacodyl (DULCOLAX) 5 MG EC tablet Take 5 mg by  mouth daily as needed (constipation).   Yes [provider]  docusate sodium (STOOL SOFTENER) 100 MG capsule Take 100 mg by mouth 2 (two) times daily as needed (constipation).   Yes [provider]  DULoxetine (CYMBALTA) 30 MG capsule Take 1 capsule by mouth 2 times daily. 03/29/22  Yes Pickenpack-Cousar, Carlena Sax, NP  HYDROmorphone (DILAUDID) 8 MG tablet Take 1 tablet by mouth every 6 hours as needed for severe pain. 04/13/22  Yes Pickenpack-Cousar, Carlena Sax, NP  lidocaine-prilocaine (EMLA) cream Apply to the Port-A-Cath site 30-60-minute before treatment Patient taking differently: Apply 1 Application topically once as needed (prior to port access - 30-60 minutes before treatment). 11/09/21  Yes Curt Bears, MD  LORazepam (ATIVAN) 1 MG tablet Take 1 tablet (1 mg total) by mouth every 8 (eight) hours as needed for anxiety. 03/29/22  Yes Pickenpack-Cousar, Carlena Sax, NP  metoprolol succinate (TOPROL-XL) 25 MG 24 hr tablet Take 0.5 tablets (12.5 mg total) by  mouth daily. Patient taking differently: Take 12.5 mg by mouth daily as needed (high blood pressure). Take 1/2 tablet (12.5 mg) if BP>120. 03/09/22  Yes Conte, Tessa N, PA-C  morphine (MS CONTIN) 60 MG 12 hr tablet Take 1 tablet by mouth every 12  hours. 04/19/22  Yes Pickenpack-Cousar, Carlena Sax, NP  ondansetron (ZOFRAN) 8 MG tablet Take 1 tablet by mouth every 8 hours as needed for nausea or vomiting. Starting 3 days after chemotherapy 01/11/22  Yes Pickenpack-Cousar, Carlena Sax, NP  prochlorperazine (COMPAZINE) 10 MG tablet Take 10 mg by mouth every 6 (six) hours as needed for nausea or vomiting. 04/26/22  Yes [provider]  XARELTO 20 MG TABS tablet Take 1 tablet (20 mg total) by mouth every evening. 04/14/22  Yes [name], MD  dexamethasone (DECADRON) 4 MG tablet Take 1 tablet (4 mg total) by mouth daily. Patient not taking: Reported on 05/04/2022 03/05/22   Kyung Rudd, MD  tiZANidine (ZANAFLEX) 2 MG tablet Take 1 tablet (2 mg total) by mouth at bedtime as needed for muscle spasms. Patient not taking: Reported on 05/04/2022 02/02/22   Jimmy Footman, NP    Inpatient Medications: Scheduled Meds:  chlorhexidine gluconate (MEDLINE KIT)  15 mL Mouth Rinse BID   Chlorhexidine Gluconate Cloth  6 each Topical Daily   midodrine  5 mg Oral TID WC   morphine  60 mg Oral Q12H   mouth rinse  15 mL Mouth Rinse 10 times per day   rivaroxaban  20 mg Oral QPM   Continuous Infusions:  promethazine (PHENERGAN) injection (IM or IVPB)     PRN Meds: benzonatate, bisacodyl, HYDROmorphone (DILAUDID) injection, HYDROmorphone, hydrOXYzine, ondansetron **OR** ondansetron (ZOFRAN) IV, promethazine (PHENERGAN) injection (IM or IVPB)  Allergies:    Allergies  Allergen Reactions   Prednisone Other (See Comments)    States that this med makes him "crazy"   Tetanus Toxoids Swelling and Other (See Comments)    Fever, Swelling of the arm    Wellbutrin [Bupropion] Other (See Comments)     Crazy thoughts, nightmares   Varenicline Other (See Comments)    Unpleasant dreams    Social History:   Social History   Socioeconomic History   Marital status: Married    Spouse name: [name]   Number of children: 3   Years of education: College   Highest education level: Not on file  Occupational History   Occupation: Scientist, research (physical sciences): SELF-EMPLOYED  Tobacco Use   Smoking status: Former  Packs/day: 0.75    Years: 44.00    Total pack years: 33.00    Types: Cigarettes    Quit date: 11/28/2015    Years since quitting: 6.4   Smokeless tobacco: Never  Vaping Use   Vaping Use: Never used  Substance and Sexual Activity   Alcohol use: Not Currently   Drug use: No    Comment: 06/21/2017 "nothing since the 1980s"   Sexual activity: Yes  Other Topics Concern   Not on file  Social History Narrative   Patient lives at home with his spouse.   Caffeine Use: yes   Social Determinants of Health   Financial Resource Strain: Not on file  Food Insecurity: Not on file  Transportation Needs: Not on file  Physical Activity: Not on file  Stress: Not on file  Social Connections: Not on file  Intimate Partner Violence: Not At Risk (08/13/2021)   Humiliation, Afraid, Rape, and Kick questionnaire    Fear of Current or Ex-Partner: No    Emotionally Abused: No    Physically Abused: No    Sexually Abused: No    Family History:    Family History  Problem Relation Age of Onset   Lung cancer Mother    Heart Problems Father    Heart attack Father 24   Stroke Father    Heart failure Father    Heart attack Maternal Grandmother    Stroke Maternal Grandmother    Heart attack Paternal Uncle    Hypertension Brother    Autoimmune disease Neg Hx      ROS:  Please see the history of present illness.   All other ROS reviewed and negative.     Physical Exam/Data:   Vitals:   05/09/22 0534 05/09/22 1537 05/09/22 2114 05/10/22 0455  BP: 109/65 (!) 90/56 107/64 118/81  Pulse:  73 86 65 89  Resp: 18 20 18 19   Temp: 98.2 F (36.8 C) 99.2 F (37.3 C) 99.1 F (37.3 C) 98.2 F (36.8 C)  TempSrc: Oral Oral Oral Oral  SpO2: 98% 97% 98% 97%  Weight:      Height:        Intake/Output Summary (Last 24 hours) at 05/10/2022 1026 Last data filed at 05/10/2022 0300 Gross per 24 hour  Intake 530 ml  Output 800 ml  Net -270 ml      05/03/2022    9:25 PM 03/30/2022    8:26 AM 03/22/2022    3:56 PM  Last 3 Weights  Weight (lbs) 138 lb 147 lb 14.4 oz 151 lb 14.4 oz  Weight (kg) 62.596 kg 67.087 kg 68.9 kg     Body mass index is 20.38 kg/m.  General:  thin male in NAD HEENT: normal Neck: no JVD Cardiac:  normal S1, S2; RRR; no murmur  Lungs:  clear to auscultation bilaterally, no wheezing, rhonchi or rales  Abd: soft, nontender, no hepatomegaly  Ext: no edema Musculoskeletal:  No deformities, BUE and BLE strength normal and equal Skin: warm and dry  Neuro:  CNs 2-12 intact, no focal abnormalities noted Psych:  Normal affect   EKG:  The EKG was personally reviewed and demonstrates:  sinus rhythm with HR 68 Telemetry:  Telemetry was personally reviewed and demonstrates:  sinus with episodes of narrow complex tachycardia likely ST with movement  Relevant CV Studies:  Left heart cath 04/11/19: Mid LAD lesion is 55% stenosed. Ost LAD to Prox LAD lesion is 50% stenosed. LIMA to LAD is patent. Ost Ramus lesion  is 90% stenosed. Ramus lesion is 75% stenosed. Ost Cx lesion is 80% stenosed. Prox Cx to Mid Cx lesion is 50% stenosed. SVG to OM is patent. Dist RCA lesion is 50% stenosed. RPDA lesion is 40% stenosed. SVG to PDA is patent with proximal disease that is unchanged. Previously placed Ost RCA to Prox RCA drug eluting stent is widely patent. Graft to the diagonal was occluded. There was TIMI 3 flow in the small diagonal with proximal ectasia. The left ventricular systolic function is normal. LV end diastolic pressure is normal. LVEDP 11 mm Hg. The left  ventricular ejection fraction is 50-55% by visual estimate. There is no aortic valve stenosis.   Continue medical therapy.      Laboratory Data:  High Sensitivity Troponin:   Recent Labs  Lab 04/20/22 1144 04/20/22 1344  TROPONINIHS 4 4     Chemistry Recent Labs  Lab 05/03/22 2329 05/05/22 0500 05/08/22 0457  NA 131* 138 139  K 3.7 3.8 3.3*  CL 99 107 110  CO2 24 24 24   GLUCOSE 95 92 97  BUN 16 11 7*  CREATININE 0.91 0.70 0.69  CALCIUM 9.0 9.0 8.6*  GFRNONAA >60 >60 >60  ANIONGAP 8 7 5     Recent Labs  Lab 05/03/22 2329 05/05/22 0500  PROT 7.0 6.9  ALBUMIN 3.0* 2.9*  AST 21 19  ALT 13 13  ALKPHOS 90 88  BILITOT 0.6 0.8   Lipids No results for input(s): "CHOL", "TRIG", "HDL", "LABVLDL", "LDLCALC", "CHOLHDL" in the last 168 hours.  Hematology Recent Labs  Lab 05/03/22 2329 05/05/22 0500 05/08/22 0457  WBC 5.9 4.9 5.1  RBC 3.32* 3.29* 3.28*  HGB 10.0* 10.0* 10.0*  HCT 30.7* 31.2* 31.4*  MCV 92.5 94.8 95.7  MCH 30.1 30.4 30.5  MCHC 32.6 32.1 31.8  RDW 14.3 14.0 14.1  PLT 202 193 217   Thyroid No results for input(s): "TSH", "FREET4" in the last 168 hours.  BNPNo results for input(s): "BNP", "PROBNP" in the last 168 hours.  DDimer No results for input(s): "DDIMER" in the last 168 hours.   Radiology/Studies:  DG Abd 2 Views  Result Date: 05/10/2022 CLINICAL DATA:  Nausea, vomiting. EXAM: ABDOMEN - 2 VIEW COMPARISON:  February 10, 2021. FINDINGS: The bowel gas pattern is normal. There is no evidence of free air. Small nonobstructive right renal calculus is noted. IMPRESSION: Small nonobstructive right renal calculus. No abnormal bowel dilatation is noted. Electronically Signed   By: Marijo Conception M.D.   On: 05/10/2022 09:27   MR THORACIC SPINE W WO CONTRAST  Result Date: 05/07/2022 CLINICAL DATA:  Ataxia.  Nontraumatic.  Non-small cell lung cancer. EXAM: MRI THORACIC WITHOUT AND WITH CONTRAST TECHNIQUE: Multiplanar and multiecho pulse sequences of the  thoracic spine were obtained without and with intravenous contrast. CONTRAST:  36m GADAVIST GADOBUTROL 1 MMOL/ML IV SOLN COMPARISON:  Vertebral body heights are maintained. A 12 mm T2 hyperintense enhancing lesion is present in the inferior aspect of the L1 vertebral body. No other discrete osseous lesions present the spine. FINDINGS: Alignment: No significant listhesis is present. Thoracic kyphosis is within normal limits. Mild leftward curvature is centered at T9, stable. Vertebrae: A 12 mm peripherally enhancing lesion is present in the right inferior aspect of L1. Other enhancing lesions are present within the thoracic spine. Vertebral body heights are maintained. Cord:  Normal signal and morphology. Paraspinal and other soft tissues: Enhancing paraspinous soft tissue present right side of the spine T8-9 T12-L1. T11 posterior rib  fracture is better seen on CT. A right pleural effusion enhances. Numerous pulmonary nodules are again noted bilaterally. The largest in the left lung measuring up to 19 mm. Disc levels: Shallow right paramedian disc protrusion is present without significant stenosis or change. Mild right foraminal narrowing is present at the same level. No other significant disc protrusions or central canal stenosis is present. The foramina are patent bilaterally. IMPRESSION: 1. 12 mm peripherally enhancing lesion in the right inferior aspect of L1 is concerning for metastatic disease to the thoracic spine. 2. Enhancing paraspinous soft tissue right side of the spine at T8-9 T12-L1 likely represents metastatic disease to the paraspinous soft tissues. 3. Right T11 posterior rib fracture is better seen on CT. 4. Numerous pulmonary nodules bilaterally compatible with metastatic disease. 5. Shallow right paramedian disc protrusion at T9-10 without significant stenosis or change. Electronically Signed   By: San Morelle M.D.   On: 05/07/2022 15:37   MR CERVICAL SPINE W WO CONTRAST  Result Date:  05/07/2022 CLINICAL DATA:  Ataxia, cervical pathology EXAM: MRI CERVICAL SPINE WITHOUT AND WITH CONTRAST TECHNIQUE: Multiplanar and multiecho pulse sequences of the cervical spine, to include the craniocervical junction and cervicothoracic junction, were obtained without and with intravenous contrast. CONTRAST:  30m GADAVIST GADOBUTROL 1 MMOL/ML IV SOLN COMPARISON:  None Available. FINDINGS: Alignment: Minimal anterolisthesis of C3 on C4. Vertebrae: No acute fracture, evidence of discitis, or aggressive bone lesion. Cord: Normal signal and morphology. Posterior Fossa, vertebral arteries, paraspinal tissues: Posterior fossa demonstrates no focal abnormality. Vertebral artery flow voids are maintained. Paraspinal soft tissues are unremarkable. Disc levels: Discs: Anterior cervical fusion at C7-T1. Degenerative disease with mild disc height loss at C4-5 and C5-6. C2-3: Small central disc protrusion. Bilateral foraminal stenosis. No central canal stenosis. C3-4: Broad-based disc bulge with a small central disc protrusion. Moderate-severe right foraminal stenosis. Moderate left foraminal stenosis. Mild spinal stenosis. C4-5: Mild broad-based disc bulge. Moderate right facet arthropathy. Moderate-severe left foraminal stenosis. Moderate right foraminal stenosis. No spinal stenosis. C5-6: Small right paracentral disc protrusion. No foraminal or central canal stenosis. C6-7: Mild broad-based disc bulge. No foraminal or central canal stenosis. C7-T1: Interbody fusion. No neural foraminal stenosis. No central canal stenosis. IMPRESSION: 1. At C3-4 there is a broad-based disc bulge with a small central disc protrusion. Moderate-severe right foraminal stenosis. Moderate left foraminal stenosis. Mild spinal stenosis. 2. At C4-5 there is a mild broad-based disc bulge. Moderate right facet arthropathy. Moderate-severe left foraminal stenosis. Moderate right foraminal stenosis. 3. Anterior cervical fusion at C7-T1 without foraminal  or central canal stenosis. Electronically Signed   By: HKathreen DevoidM.D.   On: 05/07/2022 15:23     Assessment and Plan:   Orthostatic hypotension Has been started on midodrine Please repeat orthostatic vitals Can increase to 10 mg TID if needed BB on hold - was taking intermittently at home Agree with abdominal binder and thigh high compressive stockings   Falls He describes dizziness upon standing at times, but other times of syncope and fall were not associated with position changes and preceded by "spacing out" - suspect falls may be multifactorial - recheck orthostatic vitals on midodrine   Chronic anticoagulation Xarelto for PAF and recent PE   CAD s/p CABG and PCI-RCA No antiplatelets given xarelto No chest pain    Risk Assessment/Risk Scores:       For questions or updates, please contact [email or web address] consult [email or web address] for contact info under    Signed, ALedora Bottcher  PA  05/10/2022 10:26 AM

## 2022-05-10 NOTE — Progress Notes (Signed)
Occupational Therapy Treatment Patient Details Name: [name] MRN: [id number] DOB: [birth year] Today's Date: 05/10/2022   History of present illness OBE AHLERS is a 66 y.o. male admitted 05/03/22 with weakness over the last several weeks with multiple falls after these falls the patient seems very shaky and has a brief, blank stare. After these episodes he comes to but is confused for 1 to 2 minutes. He states he is no longer able to text due to difficulty with tremor and coordination with the left hand. He is currently undergoing a clinical trial for lung cancer at [facility] and received his second round of chemotherapy. PMHx of atrial fibrillation (on Xarelto), anemia, basal cell carcinoma, CAD s/p CABG, carotid artery disease, ectopic atrial includes tachycardia, hepatitis C (treated and cured per Epic), hypercholesterolemia, renal stones, HTN, ischemic cardiomyopathy, metastatic large cell neuroendocrine carcinoma, s/p whole-brain irradiation for brain metastases one month ago, migraines, small stroke in 2018, symptomatic hypotension and syncope.   OT comments  Patient was educated on weighted gloves to reduce tremors with gross motor and [facility] tasks. Patient verbalized understanding. Patient was noted to have reduced tremor like movement in LUE with use of 1lb weighted cuff. Patient was noted to have HR increase to 148 bpm with standing to transfer into recliner in room. Nurse made aware. Patient's discharge plan remains appropriate at this time. OT will continue to follow acutely.     Recommendations for follow up therapy are one component of a multi-disciplinary discharge planning process, led by the attending physician.  Recommendations may be updated based on patient status, additional functional criteria and insurance authorization.    Follow Up Recommendations  Skilled nursing-short term rehab (<3 hours/day)    Assistance Recommended at Discharge Intermittent Supervision/Assistance   Patient can return home with the following  A little help with walking and/or transfers;A little help with bathing/dressing/bathroom;Assistance with cooking/housework;Assistance with feeding;Direct supervision/assist for medications management;Direct supervision/assist for financial management;Assist for transportation;Help with stairs or ramp for entrance   Equipment Recommendations  BSC/3in1    Recommendations for Other Services      Precautions / Restrictions Precautions Precautions: Fall Precaution Comments: orthostatic, monitor HR Restrictions Weight Bearing Restrictions: No       Mobility Bed Mobility Overal bed mobility: Modified Independent                  Transfers                         Balance Overall balance assessment: Needs assistance Sitting-balance support: Feet supported Sitting balance-Leahy Scale: Good     Standing balance support: During functional activity, Reliant on assistive device for balance Standing balance-Leahy Scale: Poor                             ADL either performed or assessed with clinical judgement   ADL Overall ADL's : Needs assistance/impaired   Eating/Feeding Details (indicate cue type and reason): patient trialed weighted cuffs on LUE with reportes of decreased tremor like movement. patient was educated on weighted gloves with noted tremors in digits and patient reporting desire to be able to text and write as long as possible. patient was shown different options via computer.                   Lower Body Dressing Details (indicate cue type and reason): patient was able to don bilateral shoes with min  guard sititng EOB               General ADL Comments: patient was noted to have HR increase to 148 bpm with standing at EOB. patient completed tranfer with min guard to recliner in room with increased time and cues for proper sequencing. patient's HR was noted to reduce to 98 bpm with rest  in chair. patient reported feelign SOB while standing but dissapated when seated. nurse made aware.    Extremity/Trunk Assessment              Vision       Perception     Praxis      Cognition Arousal/Alertness: Awake/alert Behavior During Therapy: WFL for tasks assessed/performed Overall Cognitive Status: Within Functional Limits for tasks assessed                                 General Comments: noted to have some memory issues with patient calling wife to ask where phone is during session after giving wife phone just 10 mins prior to complete task for him while in therapy. patient requested to call to verify even with education that patients wife had it        Exercises      Shoulder Instructions       General Comments      Pertinent Vitals/ Pain       Pain Assessment Pain Assessment: No/denies pain  Home Living                                          Prior Functioning/Environment              Frequency  Min 2X/week        Progress Toward Goals  OT Goals(current goals can now be found in the care plan section)  Progress towards OT goals: Progressing toward goals     Plan Discharge plan remains appropriate    Co-evaluation                 AM-PAC OT "6 Clicks" Daily Activity     Outcome Measure   Help from another person eating meals?: A Little Help from another person taking care of personal grooming?: A Little Help from another person toileting, which includes using toliet, bedpan, or urinal?: A Little Help from another person bathing (including washing, rinsing, drying)?: A Little Help from another person to put on and taking off regular upper body clothing?: A Little Help from another person to put on and taking off regular lower body clothing?: A Little 6 Click Score: 18    End of Session Equipment Utilized During Treatment: Gait belt;Rolling walker (2 wheels)  OT Visit Diagnosis:  Unsteadiness on feet (R26.81);Muscle weakness (generalized) (M62.81);History of falling (Z91.81);Repeated falls (R29.6);Pain;Other symptoms and signs involving cognitive function;Feeding difficulties (R63.3) Pain - Right/Left: Right Pain - part of body: Hip   Activity Tolerance Patient tolerated treatment well;Other (comment) (limited by HR)   Patient Left in chair;with call bell/phone within reach;with chair alarm set   Nurse Communication Mobility status;Other (comment) (increased HR)        Time: 6283-1517 OT Time Calculation (min): 30 min  Charges: OT General Charges $OT Visit: 1 Visit OT Treatments $Self Care/Home Management : 23-37 mins  Rennie Plowman, MS Acute Rehabilitation Department Office# [phone]  Marcellina Millin 05/10/2022, 4:35 PM

## 2022-05-10 NOTE — TOC Progression Note (Signed)
Transition of Care [facility]) - Progression Note    Patient Details  Name: JOHNN KRASOWSKI MRN: [id number] Date of Birth: [birth year]  Transition of Care [facility]) CM/SW Contact  Leeroy Cha, RN Phone Number: 05/10/2022, 2:41 PM  Clinical Narrative:    Tct-daughter lindsey list of bed offers given with addresses to the daughter will investigate and call with a decisiond.   Expected Discharge Plan: [place] Barriers to Discharge: Continued Medical Work up  Expected Discharge Plan and Services Expected Discharge Plan: [place]   Discharge Planning Services: CM Consult Post Acute Care Choice: [place] Living arrangements for the past 2 months: Single Family Home                                       Social Determinants of Health (SDOH) Interventions    Readmission Risk Interventions   Row Labels 05/09/2022   10:48 AM 03/17/2022   12:19 PM 08/21/2021    9:25 AM  Readmission Risk Prevention Plan   Section Header. No data exists in this row.     Transportation Screening   Complete Complete Complete  Medication Review Press photographer)   Complete Complete Complete  PCP or Specialist appointment within 3-5 days of discharge   Complete Complete Complete  HRI or Home Care Consult   Complete Complete Complete  SW Recovery Care/Counseling Consult   Complete Complete Complete  SW Consult Not Complete Comments     n/a  Palliative Care Screening   Not Applicable Complete Not Applicable  [place]   Complete Not Applicable Not Applicable

## 2022-05-10 NOTE — Progress Notes (Addendum)
Patient is sitting up in the recliner, talking on his cell phone. He is refusing abdominal binder, CHG bath and medications at this time. Attempted x3. When asked if he can take some time to allow for care and medication administration he stated, " I can't. I've got to do this. I've got to get this squared away."  Abdominal binder at the bedside. Secretary ordered TED hose.

## 2022-05-11 ENCOUNTER — Telehealth: Payer: Self-pay

## 2022-05-11 DIAGNOSIS — C7931 Secondary malignant neoplasm of brain: Secondary | ICD-10-CM | POA: Diagnosis not present

## 2022-05-11 DIAGNOSIS — I25709 Atherosclerosis of coronary artery bypass graft(s), unspecified, with unspecified angina pectoris: Secondary | ICD-10-CM | POA: Diagnosis not present

## 2022-05-11 DIAGNOSIS — Z66 Do not resuscitate: Secondary | ICD-10-CM | POA: Diagnosis not present

## 2022-05-11 DIAGNOSIS — I480 Paroxysmal atrial fibrillation: Secondary | ICD-10-CM | POA: Diagnosis not present

## 2022-05-11 DIAGNOSIS — I951 Orthostatic hypotension: Principal | ICD-10-CM

## 2022-05-11 DIAGNOSIS — Z006 Encounter for examination for normal comparison and control in clinical research program: Secondary | ICD-10-CM | POA: Diagnosis not present

## 2022-05-11 DIAGNOSIS — G9341 Metabolic encephalopathy: Secondary | ICD-10-CM | POA: Diagnosis not present

## 2022-05-11 LAB — CORTISOL: Cortisol, Plasma: 13.4 ug/dL

## 2022-05-11 MED ORDER — PANTOPRAZOLE SODIUM 40 MG IV SOLR
40.0000 mg | Freq: Once | INTRAVENOUS | Status: AC
  Administered 2022-05-11: 40 mg via INTRAVENOUS
  Filled 2022-05-11: qty 10

## 2022-05-11 MED ORDER — SODIUM CHLORIDE 0.9 % IV SOLN: INTRAVENOUS | Status: DC

## 2022-05-11 MED ORDER — ALUM & MAG HYDROXIDE-SIMETH 200-200-20 MG/5ML PO SUSP
30.0000 mL | ORAL | Status: DC | PRN
  Administered 2022-05-11 – 2022-05-12 (×2): 30 mL via ORAL
  Filled 2022-05-11 (×4): qty 30

## 2022-05-11 MED ORDER — SODIUM CHLORIDE 0.9 % IV SOLN
12.5000 mg | Freq: Once | INTRAVENOUS | Status: AC
Start: 2022-05-11 — End: 2022-05-11
  Administered 2022-05-11: 12.5 mg via INTRAVENOUS
  Filled 2022-05-11: qty 12.5

## 2022-05-11 MED ORDER — PROCHLORPERAZINE EDISYLATE 10 MG/2ML IJ SOLN
5.0000 mg | Freq: Once | INTRAMUSCULAR | Status: AC
  Administered 2022-05-11: 5 mg via INTRAVENOUS
  Filled 2022-05-11: qty 2

## 2022-05-11 MED ORDER — HYDROMORPHONE HCL 1 MG/ML IJ SOLN
0.5000 mg | Freq: Three times a day (TID) | INTRAMUSCULAR | Status: DC | PRN
  Administered 2022-05-11 – 2022-05-12 (×2): 0.5 mg via INTRAVENOUS
  Filled 2022-05-11 (×2): qty 0.5

## 2022-05-11 MED ORDER — SODIUM CHLORIDE 0.9 % IV SOLN
12.5000 mg | Freq: Once | INTRAVENOUS | Status: AC
  Administered 2022-05-11: 12.5 mg via INTRAVENOUS
  Filled 2022-05-11: qty 12.5

## 2022-05-11 MED ORDER — METHOCARBAMOL 500 MG PO TABS
500.0000 mg | ORAL_TABLET | Freq: Three times a day (TID) | ORAL | Status: DC | PRN
  Administered 2022-05-11 – 2022-05-12 (×2): 500 mg via ORAL
  Filled 2022-05-11 (×2): qty 1

## 2022-05-11 MED ORDER — MIDODRINE HCL 5 MG PO TABS
10.0000 mg | ORAL_TABLET | Freq: Three times a day (TID) | ORAL | Status: DC
  Administered 2022-05-11 – 2022-05-13 (×6): 10 mg via ORAL
  Filled 2022-05-11 (×8): qty 2

## 2022-05-11 NOTE — Progress Notes (Signed)
Inpatient Rehab Admissions Coordinator:   Spoke to pt's spouse over the phone. Updated on plan for potential CIR.  PT to see later today for tolerance/needs.  We also discussed need to transition off of IV dilaudid to fully PO regimen (while maintaining adequate pain control).  Will continue to follow for readiness to transition and will discuss with Dr. Karleen Hampshire and TOC as well.   Shann Medal, PT, DPT Admissions Coordinator [phone] 05/11/22  10:13 AM

## 2022-05-11 NOTE — Telephone Encounter (Signed)
Pt called and asked for a refill of pain meds, pt notified that while he is in the hospital he cannot have his pain meds refilled. Pt informed that once he is discharged then our office will send in a refill. Pt verbalized understanding, no further concerns at this time.

## 2022-05-11 NOTE — Progress Notes (Signed)
Physical Therapy Treatment Patient Details Name: [name] MRN: [id number] DOB: [birth year] Today's Date: 05/11/2022   History of Present Illness TYREK LAWHORN is a 66 y.o. male admitted 05/03/22 with weakness over the last several weeks with multiple falls after these falls the patient seems very shaky and has a brief, blank stare. After these episodes he comes to but is confused for 1 to 2 minutes. He states he is no longer able to text due to difficulty with tremor and coordination with the left hand. He is currently undergoing a clinical trial for lung cancer at [facility] and received his second round of chemotherapy. PMHx of atrial fibrillation (on Xarelto), anemia, basal cell carcinoma, CAD s/p CABG, carotid artery disease, ectopic atrial includes tachycardia, hepatitis C (treated and cured per Epic), hypercholesterolemia, renal stones, HTN, ischemic cardiomyopathy, metastatic large cell neuroendocrine carcinoma, s/p whole-brain irradiation for brain metastases one month ago, migraines, small stroke in 2018, symptomatic hypotension and syncope.    PT Comments    Pt very eager to mobilize.  Pt wearing TED hose and abdominal binder on arrival to room.  Pt able to tolerate standing and marching in place and then ambulated in hallway with recliner following for safety.  Updated d/c plan for CIR as pt and family eager for pt to regain independence and return home safely as soon as possible.    Recommendations for follow up therapy are one component of a multi-disciplinary discharge planning process, led by the attending physician.  Recommendations may be updated based on patient status, additional functional criteria and insurance authorization.  Follow Up Recommendations  Acute inpatient rehab (3hours/day) Can patient physically be transported by private vehicle: Yes   Assistance Recommended at Discharge Intermittent Supervision/Assistance  Patient can return home with the following A little  help with walking and/or transfers;A little help with bathing/dressing/bathroom;Assistance with cooking/housework;Help with stairs or ramp for entrance   Equipment Recommendations  None recommended by PT    Recommendations for Other Services       Precautions / Restrictions Precautions Precautions: Fall Precaution Comments: orthostatic, monitor HR Other Brace: has abdominal binder and TEDs Restrictions Weight Bearing Restrictions: No     Mobility  Bed Mobility Overal bed mobility: Modified Independent             General bed mobility comments: upon sitting EOB, had pt perform UE movements such as reaching, shoulder abduction and shoulder flexion for approx 2 minutes prior to standing    Transfers Overall transfer level: Needs assistance Equipment used: Rolling walker (2 wheels) Transfers: Sit to/from Stand Sit to Stand: Min assist           General transfer comment: assist to rise and steady, pt marched in place approx 30 seconds and then returned to sitting due to fatigue; pt performed again min assist to rise and steady, pt able to control descent    Ambulation/Gait Ambulation/Gait assistance: Min assist, Min guard Gait Distance (Feet): 120 Feet Assistive device: Rolling walker (2 wheels) Gait Pattern/deviations: Decreased stride length, Step-through pattern, Trunk flexed Gait velocity: decreased     General Gait Details: assist for stability, pt reported mild dizziness however states different type then symptoms from orthostasis; pt c/o chest "tightness" after ambulating however no pain, SpO2 98%; HR was elevated to 148 bpm during ambulation however 105 bpm upon sitting in recliner; recliner following during ambulation for Barrister's clerk  Modified Rankin (Stroke Patients Only)       Balance                                            Cognition Arousal/Alertness: Awake/alert Behavior During  Therapy: WFL for tasks assessed/performed Overall Cognitive Status: Within Functional Limits for tasks assessed                                          Exercises      General Comments        Pertinent Vitals/Pain Pain Assessment Pain Assessment: Faces Faces Pain Scale: Hurts little more Pain Location: L ribs Pain Descriptors / Indicators: Grimacing, Sore Pain Intervention(s): Monitored during session, Repositioned    Home Living                          Prior Function            PT Goals (current goals can now be found in the care plan section) Progress towards PT goals: Progressing toward goals    Frequency    Min 3X/week      PT Plan Discharge plan needs to be updated    Co-evaluation              AM-PAC PT "6 Clicks" Mobility   Outcome Measure  Help needed turning from your back to your side while in a flat bed without using bedrails?: None Help needed moving from lying on your back to sitting on the side of a flat bed without using bedrails?: None Help needed moving to and from a bed to a chair (including a wheelchair)?: A Little Help needed standing up from a chair using your arms (e.g., wheelchair or bedside chair)?: A Little Help needed to walk in hospital room?: A Lot Help needed climbing 3-5 steps with a railing? : A Lot 6 Click Score: 18    End of Session Equipment Utilized During Treatment: Gait belt Activity Tolerance: Patient tolerated treatment well Patient left: in chair;with call bell/phone within reach;with chair alarm set;with family/visitor present   PT Visit Diagnosis: Unsteadiness on feet (R26.81);Muscle weakness (generalized) (M62.81);Repeated falls (R29.6)     Time: 5697-9480 PT Time Calculation (min) (ACUTE ONLY): 23 min  Charges:  $Gait Training: 23-37 mins                     Jannette Spanner PT, DPT Physical Therapist Acute Rehabilitation Services Preferred contact method: Secure Chat Weekend  Pager Only: [phone] Office: [place] 05/11/2022, 1:17 PM

## 2022-05-11 NOTE — Progress Notes (Signed)
PROGRESS NOTE    KHAYRI KARGBO  FKC:[id number] DOB: [birth year] DOA: 05/03/2022 PCP: [name], MD    Chief Complaint  Patient presents with   Weakness    Brief Narrative:   KENI ELISON is a 66 y.o. 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 Presenting with falls.   Patient underwent CT Head, Chest, abdomen and pelvis along with cervical spine and results reviewed with the patient.  Neurology consulted, recommended getting EEG, MRI Cervical and thoracic spine.  Discussed the results of the MRI with the patient and family. EEG does not show any active seizures. Neurology signed off.  Patient was found to be in severe orthostatic hypotension. He was started on compression stockings up to thigh, abdominal binder, stopped the ativan and Cymbalta and started him on Midodrine with minimal improvement in the orthostatic bp measurements.  Cardiology consulted for severe orthostatic hypotension and recommendations given.  Meanwhile therapy eval recommending CIR.     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 EEG shows mild diffuse encephalopathy,  No seizures or epileptiform discharges were seen throughout the recording. Neurology recommending MRI of the C-spine and thoracic spine for further evaluation.  Discussed the results of the MRI  of the C spine and thoracic spine with the patient and daughter on the phone.   - daughter reports his symptoms of unresponsiveness and multiple falls are mostly when gets up . He was found to have significant orthostatic hypotension which would explain his dizzy spells. .    Orthostatic hypotension -Persistent hypotension on standing, with dizziness.  -Gentle hydration, compression stockings up to the thigh, abdominal binder . - midodrine added, discontinued the ativan and Cymbalta for now. Increased the dose of midodrine to 10 mg TID due to persistent orthostatic hypotension.  - suspect probably from autonomic dysfunction from chemotherapy? - appreciate cardiology input, will start him on mestinon if he remains orthostatic in am.  - carotid duplex less than a year is wnl.    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 Recommend outpatient follow-up with oncology at [facility]. Patient enrolled in clinical trial at [facility], The Alirocumab dose was due today, Pharmacy contacted his trial study coordinator this morning who said standard practice is to hold it while admitted.  Discussed the results of the MRI with the daughter and patient, unable to open films from February.    PAF  Rate controlled.  On Xarelto for anti coagulation.    CAD s/p PCI, Currently chest pain free.     Anemia of chronic disease:  Transfuse to keep hemoglobin greater than 7.  Currently hemoglobin stable at 10.     Hyponatremia:  Resolved.   Hypokalemia:  Replaced.  Recheck today.   Some nausea and an episode of dry heaving earlier this am.  Abd x rays ordered , showed Small nonobstructive right  renal calculus. No abnormal bowel dilatation is noted.     DVT prophylaxis: Xarelto.  Code Status: DNR.  Family Communication: none at bedside, . Discussed the plan with daughter on phone. Disposition:   Status is: Inpatient Remains  inpatient appropriate because: waiting for CIR.    Level of care: Telemetry Consultants:  Neurology.  CARDIOLOGY  Procedures: MRI c and thoracic spine.   Antimicrobials: none.    Subjective: NO NAUSEA or vomiting.   Objective: Vitals:   05/10/22 2132 05/11/22 0518 05/11/22 0926 05/11/22 1102  BP: 109/66 102/69  107/75  Pulse: 87 86    Resp: _0 Temp: 98.7 F (37.1 C) 98.3 F (36.8 C)    TempSrc: Oral Oral    SpO2: 98% 97% 96%   Weight:      Height:        Intake/Output Summary (Last 24 hours) at 05/11/2022 1251 Last data filed at 05/11/2022 0926 Gross per 24 hour  Intake 410 ml  Output --  Net 410 ml    Filed Weights   05/03/22 2125  Weight: 62.6 kg    Examination:  General exam: Appears calm and comfortable  Respiratory system: Clear to auscultation. Respiratory effort normal. Cardiovascular system: S1 & S2 heard, RRR. No JVD, No pedal edema. Gastrointestinal system: Abdomen is nondistended, soft and nontender. Normal bowel sounds heard. Central nervous system: Alert and oriented. No focal neurological deficits. Extremities: Symmetric 5 x 5 power. Skin: No rashes, lesions or ulcers Psychiatry:  Mood & affect appropriate.     Data Reviewed: I have personally reviewed following labs and imaging studies  CBC: Recent Labs  Lab 05/05/22 0500 05/08/22 0457  WBC 4.9 5.1  NEUTROABS  --  3.1  HGB 10.0* 10.0*  HCT 31.2* 31.4*  MCV 94.8 95.7  PLT 193 217     Basic Metabolic Panel: Recent Labs  Lab 05/05/22 0500 05/08/22 0457 05/10/22 1300  NA 138 139 139  K 3.8 3.3* 3.7  CL 107 110 106  CO2 _1 GLUCOSE 92 97 112*  BUN 11 7* 15  CREATININE 0.70 0.69 0.72  CALCIUM 9.0 8.6* 8.9     GFR: Estimated Creatinine Clearance: 79.3 mL/min (by C-G formula based on SCr of 0.72 mg/dL).  Liver Function Tests: Recent Labs  Lab 05/05/22 0500  AST 19  ALT 13  ALKPHOS 88  BILITOT 0.8  PROT 6.9  ALBUMIN 2.9*     CBG: No results for  input(s): "GLUCAP" in the last 168 hours.    Recent Results (from the past 240 hour(s))  Culture, blood (routine x 2)     Status: None   Collection Time: 05/03/22 11:24 PM   Specimen: BLOOD  Result Value Ref Range Status   Specimen Description   Final    BLOOD Performed at [facility], [address], [place], [place] [address]    Special Requests   Final    BOTTLES DRAWN AEROBIC AND ANAEROBIC Blood Culture results may not be optimal due to an excessive volume of blood received in culture bottles Performed at [facility], [address] [place], [place] [address]    Culture   Final    NO GROWTH 5 DAYS Performed at [facility], [place] [address] [place], [place] [address]    Report Status 05/08/2022 FINAL  Final  Culture, blood (routine x 2)     Status: None   Collection Time: 05/04/22  3:19  PM   Specimen: BLOOD  Result Value Ref Range Status   Specimen Description   Final    BLOOD BLOOD LEFT ARM Performed at [facility] [address], [place], [place] [address]    Special Requests   Final    BOTTLES DRAWN AEROBIC ONLY Blood Culture adequate volume Performed at [place] [address], [place],  [address]    Culture   Final    NO GROWTH 5 DAYS Performed at [facility], [place] [address], [place],  [address]    Report Status 05/09/2022 FINAL  Final         Radiology Studies: DG Abd 2 Views  Result Date: 05/10/2022 CLINICAL DATA:  Nausea, vomiting. EXAM: ABDOMEN - 2 VIEW COMPARISON:  February 10, 2021. FINDINGS: The bowel gas pattern is normal. There is no evidence of free air. Small nonobstructive right renal calculus is noted. IMPRESSION: Small nonobstructive right renal calculus. No abnormal bowel dilatation is noted. Electronically Signed   By: Marijo Conception M.D.   On: 05/10/2022 09:27        Scheduled Meds:  chlorhexidine gluconate (MEDLINE KIT)  15 mL Mouth Rinse BID    Chlorhexidine Gluconate Cloth  6 each Topical Daily   midodrine  10 mg Oral TID WC   morphine  60 mg Oral Q12H   mouth rinse  15 mL Mouth Rinse 10 times per day   rivaroxaban  20 mg Oral QPM   Continuous Infusions:  sodium chloride 50 mL/hr at 05/11/22 1100      LOS: 7 days        Hosie Poisson, MD [facility]   To contact the attending provider between 7A-7P or the covering provider during after hours 7P-7A, please log into the web site [email or web address] and access using universal [place] password for that web site. If you do not have the password, please call the hospital operator.  05/11/2022, 12:51 PM

## 2022-05-11 NOTE — Care Management Important Message (Signed)
Important Message  Patient Details IM Letter given to the Patient. Name: [name] MRN: [id number] Date of Birth: October 23, 1955   Medicare Important Message Given:  Yes     Kerin Salen 05/11/2022, 10:18 AM

## 2022-05-11 NOTE — Progress Notes (Addendum)
Rounding Note    Patient Name: [name] Date of Encounter: 05/11/2022  [place] Cardiologist: Larae Grooms, MD   Subjective   Patient feels dizzy when laying flat in the bed. Orthostatic vital signs positive this AM. Denies chest pain, palpitations, sob.   Inpatient Medications    Scheduled Meds:  chlorhexidine gluconate (MEDLINE KIT)  15 mL Mouth Rinse BID   Chlorhexidine Gluconate Cloth  6 each Topical Daily   midodrine  5 mg Oral TID WC   morphine  60 mg Oral Q12H   mouth rinse  15 mL Mouth Rinse 10 times per day   rivaroxaban  20 mg Oral QPM   Continuous Infusions:  PRN Meds: benzonatate, bisacodyl, HYDROmorphone (DILAUDID) injection, HYDROmorphone, hydrOXYzine, ondansetron **OR** ondansetron (ZOFRAN) IV   Vital Signs    Vitals:   05/10/22 0455 05/10/22 1351 05/10/22 2132 05/11/22 0518  BP: 118/81 103/75 109/66 102/69  Pulse: 89 68 87 86  Resp: 19 12 17 17   Temp: 98.2 F (36.8 C) 98.9 F (37.2 C) 98.7 F (37.1 C) 98.3 F (36.8 C)  TempSrc: Oral Oral Oral Oral  SpO2: 97% 99% 98% 97%  Weight:      Height:        Intake/Output Summary (Last 24 hours) at 05/11/2022 0921 Last data filed at 05/10/2022 1500 Gross per 24 hour  Intake 50 ml  Output --  Net 50 ml      05/03/2022    9:25 PM 03/30/2022    8:26 AM 03/22/2022    3:56 PM  Last 3 Weights  Weight (lbs) 138 lb 147 lb 14.4 oz 151 lb 14.4 oz  Weight (kg) 62.596 kg 67.087 kg 68.9 kg      Telemetry    Sinus rhythm - Personally Reviewed  ECG    No new tracings since 9/4 - Personally Reviewed  Physical Exam   GEN: No acute distress. Laying flat in the bed, thin  Neck: No JVD Cardiac: RRR, no murmurs, rubs, or gallops.  Respiratory: Clear to auscultation bilaterally. Normal WOB on room air  GI: Soft, nontender, non-distended. Abdominal binder in place  MS: No edema; No deformity. Compression stockings in place  Neuro:  Nonfocal  Psych: Normal affect   Labs    High  Sensitivity Troponin:   Recent Labs  Lab 04/20/22 1144 04/20/22 1344  TROPONINIHS 4 4     Chemistry Recent Labs  Lab 05/05/22 0500 05/08/22 0457 05/10/22 1300  NA 138 139 139  K 3.8 3.3* 3.7  CL 107 110 106  CO2 24 24 26   GLUCOSE 92 97 112*  BUN 11 7* 15  CREATININE 0.70 0.69 0.72  CALCIUM 9.0 8.6* 8.9  PROT 6.9  --   --   ALBUMIN 2.9*  --   --   AST 19  --   --   ALT 13  --   --   ALKPHOS 88  --   --   BILITOT 0.8  --   --   GFRNONAA >60 >60 >60  ANIONGAP 7 5 7     Lipids No results for input(s): "CHOL", "TRIG", "HDL", "LABVLDL", "LDLCALC", "CHOLHDL" in the last 168 hours.  Hematology Recent Labs  Lab 05/05/22 0500 05/08/22 0457  WBC 4.9 5.1  RBC 3.29* 3.28*  HGB 10.0* 10.0*  HCT 31.2* 31.4*  MCV 94.8 95.7  MCH 30.4 30.5  MCHC 32.1 31.8  RDW 14.0 14.1  PLT 193 217   Thyroid No results for input(s): "  TSH", "FREET4" in the last 168 hours.  BNPNo results for input(s): "BNP", "PROBNP" in the last 168 hours.  DDimer No results for input(s): "DDIMER" in the last 168 hours.   Radiology    DG Abd 2 Views  Result Date: 05/10/2022 CLINICAL DATA:  Nausea, vomiting. EXAM: ABDOMEN - 2 VIEW COMPARISON:  February 10, 2021. FINDINGS: The bowel gas pattern is normal. There is no evidence of free air. Small nonobstructive right renal calculus is noted. IMPRESSION: Small nonobstructive right renal calculus. No abnormal bowel dilatation is noted. Electronically Signed   By: Marijo Conception M.D.   On: 05/10/2022 09:27    Cardiac Studies     Patient Profile     66 y.o. 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 Karleen Hampshire.   Assessment & Plan    Orthostatic Hypotension  Falls   - Patient was started a chemotherapy trial with [facility] in 03/2022. For the past few weeks, patient has had multiple falls. Feels like he has "spaced out" during some fo the falls  - EEG showed mild diffuse encephalopathy without seizure activity  - Patient has been very orthostatic here-- noted that orthostatic vital signs on 9/9 showed BP 112/72 lying and HR 73 lying, BP 81/61 standing and HR 117 standing  - Holding home BP medications  - Patient reports good PO intake  - Placed thigh-high compression stockings and abdominal binder yesterday - Started midodrine 5 mg at 7 AM, noon, 4 PM yesterday  - Orthostatic vital signs positive this AM, patient feels dizzy when laying in bed and when trying to stand.  - Increase midodrine to 10 mg TID  - Most recent echo from 11/2021 showed EF 55-60%. Patient was receiving IV fluids earlier this admission and felt some improvement in symptoms. Resume gentle IV hydration  - AM cortisol pending   CAD s/p CABG  - Not on antiplatelet due to need for xarelto  - Not on BB due to orthostatic hypotension   Chronic Anticoagulation  Paroxysmal Atrial Fibrillation  Recent PE  - Continue xarelto 20 mg daily        For questions or updates, please contact [place] Please consult [email or web address] for contact info under        Signed, Margie Billet, PA-C  05/11/2022, 9:21 AM

## 2022-05-12 DIAGNOSIS — I25709 Atherosclerosis of coronary artery bypass graft(s), unspecified, with unspecified angina pectoris: Secondary | ICD-10-CM | POA: Diagnosis not present

## 2022-05-12 DIAGNOSIS — I480 Paroxysmal atrial fibrillation: Secondary | ICD-10-CM | POA: Diagnosis not present

## 2022-05-12 DIAGNOSIS — C7A1 Malignant poorly differentiated neuroendocrine tumors: Secondary | ICD-10-CM

## 2022-05-12 DIAGNOSIS — Z515 Encounter for palliative care: Secondary | ICD-10-CM

## 2022-05-12 DIAGNOSIS — W19XXXD Unspecified fall, subsequent encounter: Secondary | ICD-10-CM | POA: Diagnosis not present

## 2022-05-12 DIAGNOSIS — R531 Weakness: Secondary | ICD-10-CM | POA: Diagnosis not present

## 2022-05-12 DIAGNOSIS — E871 Hypo-osmolality and hyponatremia: Secondary | ICD-10-CM | POA: Diagnosis not present

## 2022-05-12 DIAGNOSIS — Z66 Do not resuscitate: Secondary | ICD-10-CM

## 2022-05-12 DIAGNOSIS — I951 Orthostatic hypotension: Secondary | ICD-10-CM | POA: Diagnosis not present

## 2022-05-12 DIAGNOSIS — Z7189 Other specified counseling: Secondary | ICD-10-CM

## 2022-05-12 MED ORDER — HYDROMORPHONE HCL 1 MG/ML IJ SOLN
0.5000 mg | Freq: Once | INTRAMUSCULAR | Status: AC
  Administered 2022-05-12: 0.5 mg via INTRAVENOUS
  Filled 2022-05-12: qty 0.5

## 2022-05-12 MED ORDER — HEPARIN SOD (PORK) LOCK FLUSH 100 UNIT/ML IV SOLN
500.0000 [IU] | Freq: Once | INTRAVENOUS | Status: AC
  Administered 2022-05-12: 500 [IU] via INTRAVENOUS
  Filled 2022-05-12: qty 5

## 2022-05-12 MED ORDER — PROMETHAZINE HCL 25 MG PO TABS
12.5000 mg | ORAL_TABLET | Freq: Once | ORAL | Status: AC
  Administered 2022-05-12: 12.5 mg via ORAL
  Filled 2022-05-12: qty 1

## 2022-05-12 NOTE — Progress Notes (Signed)
OT Cancellation Note  Patient Details Name: HERNANDEZ LOSASSO MRN: [id number] DOB: 1956-07-03   Cancelled Treatment:    Reason Eval/Treat Not Completed: Other (comment) Nursing providing care for patient at this time with elevated HR. OT to continue to follow and check back as schedule will allow.  Rennie Plowman, MS Acute Rehabilitation Department Office# [phone]  05/12/2022, 11:35 AM

## 2022-05-12 NOTE — PMR Pre-admission (Signed)
PMR Admission Coordinator Pre-Admission Assessment   Patient: [name] is an 66 y.o., male MRN: [id number] DOB: 12/15/1955 Height: 5' 9" (175.3 cm) Weight: 62.6 kg   Insurance Information HMO:     PPO:      PCP:      IPA:      80/20:      OTHER:  PRIMARY: medicare A/B      Policy#: [id number]      Subscriber:  CM Name:       Phone#:      Fax#:  Pre-Cert#:       Employer:  Benefits:  Phone #:      Name:  Eff. Date: 03/30/20 A/B     Deduct: $1600      Out of Pocket Max: n/a      Life Max: n/a CIR: 100%      SNF: 20 full days Outpatient: 80%     Co-Ins: 20% Home Health: 100%      Co-Pay:  DME: 80%     Co-Ins: 20% Providers: pt choice SECONDARY: [employer] Supplement      Policy#: [id number]     Phone#:    Financial Counselor:       Phone#:    The "Data Collection Information Summary" for patients in Inpatient Rehabilitation Facilities with attached "Privacy Act Statement-Health Care Records" was provided and verbally reviewed with: Patient and Family   Emergency Contact Information Contact Information       Name Relation Home Work Mobile    Klecka,Julie Spouse [phone]   [phone]    Kristiansen,Lindsay Daughter [phone]   [phone]    Jones,Kevin Friend     [phone]           Current Medical History  Patient Admitting Diagnosis: debility    History of Present Illness: Pt is a 66 y/o male with PMH of afib (on Xarelto), anemia, basal cell carcinoma, CAD s/p CABG, CAD, tacchycardia, hepatitis C, HTN, ischemic cardiomyopathy, and 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  He was admitted to [facility] on 05/03/22 with several week progression of weakness and multiple falls associated with shakiness and brief/blank stare.  On arrival to ED, labs and vitals essentially unremarkable, CXR shows stable innumerable lung  mets, small right pleural effusion, and no fractures.  CTH/spine/chest/abd/pelvis showed relatively stable metastatic disease.  Neurology was consulted and recommended LTM EEG and possible Holter monitor as an outpatient to assess for arrhythmias.  EEG essentially negative, and syncope episodes/spells thought to be related to symptomatic orthostatic hypotension.  Cardiology consulted and felt episodes related to autonomic dysfunction related to chemotherapy (currently in clinical trial at [facility], which is on hold while he is admitted).  Cards workup included labs showing sodium 139, creatinine .69, BUN 7, Hgb 10; orthostatic BPs (systolic 112>96>81).  Cards felt orthostatic BP likely contributing to multiple falls.  Recommended maintain hydration, thigh-high compression stockings and abdominal binder.  Stopped ativan and cymbalta, and started midodrine 5mg TID, increased to 10mg TID.  Recommend starting Mestinon if orthostasis persists.  Therapy ongoing and pt was recommended for CIR.    Patient's medical record from [facility] has been reviewed by the rehabilitation admission coordinator and physician.   Past Medical History      Past Medical History:  Diagnosis Date   Anemia     Anxiety     Arthritis       Basal cell carcinoma (BCC) of forehead     CAD (coronary artery disease)      a. 10/2015 ant STEMI >> LHC with 3 v CAD; oLAD tx with POBA >> emergent CABG. b. Multiple evals since that time, early graft failure of SVG-RCA by cath 03/2016. c. 2/19 PCI/DES x1 to pRCA, normal EF.   Carotid artery disease (HCC)      a. 40-59% BICA 02/2018.   Depression     Dyspnea     Ectopic atrial tachycardia (HCC)     Esophageal reflux      eosinophil esophagitis   Family history of adverse reaction to anesthesia      "sister has PONV" (06/21/2017)   Former tobacco use     Gout     Hepatitis C      "treated and cured" (06/21/2017)   High cholesterol     History of blood transfusion     History of kidney  stones     Hypertension     Ischemic cardiomyopathy      a. EF 25-30% at intraop TEE 4/17  //  b. Limited Echo 5/17 - EF 45-50%, mild ant HK. c. EF 55-65% by cath 09/2017.   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HCC)     Migraine      "3-4/yr" (06/21/2017)   Myocardial infarction (HCC) 10/2015   Palpitations     Pneumonia     Sinus bradycardia      a. HR dropping into 40s in 02/2016 -> BB reduced.   Stroke (HCC) 10/2016    "small one; sometimes my memory/cognitive issues" (06/21/2017)   Symptomatic hypotension      a. 02/2016 ER visit -> meds reduced.   Syncope     Wears dentures     Wears glasses        Has the patient had major surgery during 100 days prior to admission? No   Family History   family history includes Heart Problems in his father; Heart attack in his maternal grandmother and paternal uncle; Heart attack (age of onset: 70) in his father; Heart failure in his father; Hypertension in his brother; Lung cancer in his mother; Stroke in his father and maternal grandmother.   Current Medications   Current Facility-Administered Medications:    alum & mag hydroxide-simeth (MAALOX/MYLANTA) 200-200-20 MG/5ML suspension 30 mL, 30 mL, Oral, Q4H PRN, Akula, Vijaya, MD, 30 mL at 05/12/22 2115   benzonatate (TESSALON) capsule 100 mg, 100 mg, Oral, TID PRN, Kyle, Tyrone A, DO, 100 mg at 05/13/22 0843   bisacodyl (DULCOLAX) EC tablet 5 mg, 5 mg, Oral, Daily PRN, Kyle, Tyrone A, DO   chlorhexidine gluconate (MEDLINE KIT) (PERIDEX) 0.12 % solution 15 mL, 15 mL, Mouth Rinse, BID, Kyle, Tyrone A, DO, 15 mL at 05/13/22 0845   Chlorhexidine Gluconate Cloth 2 % PADS 6 each, 6 each, Topical, Daily, Kyle, Tyrone A, DO, 6 each at 05/13/22 1010   HYDROmorphone (DILAUDID) tablet 4 mg, 4 mg, Oral, Q4H PRN, Akula, Vijaya, MD, 4 mg at 05/13/22 0843   hydrOXYzine (ATARAX) tablet 10 mg, 10 mg, Oral, TID PRN, Akula, Vijaya, MD, 10 mg at 05/08/22 0459   methocarbamol (ROBAXIN) tablet 500 mg, 500 mg, Oral,  Q8H PRN, Akula, Vijaya, MD, 500 mg at 05/12/22 0418   midodrine (PROAMATINE) tablet 10 mg, 10 mg, Oral, TID WC, Akula, Vijaya, MD, 10 mg at 05/13/22 0844   morphine (MS CONTIN) 12 hr tablet 60 mg, 60 mg, Oral, Q12H, Akula, Vijaya,   MD, 60 mg at 05/13/22 1001   ondansetron (ZOFRAN) tablet 4 mg, 4 mg, Oral, Q6H PRN, 4 mg at 05/13/22 0519 **OR** ondansetron (ZOFRAN) injection 4 mg, 4 mg, Intravenous, Q6H PRN, Kyle, Tyrone A, DO, 4 mg at 05/12/22 1801   Oral care mouth rinse, 15 mL, Mouth Rinse, 10 times per day, Kyle, Tyrone A, DO, 15 mL at 05/13/22 1011   rivaroxaban (XARELTO) tablet 20 mg, 20 mg, Oral, QPM, Kyle, Tyrone A, DO, 20 mg at 05/12/22 1757   Patients Current Diet:  Diet Order                  Diet regular Room service appropriate? Yes; Fluid consistency: Thin  Diet effective now                         Precautions / Restrictions Precautions Precautions: Fall Precaution Comments: orthostatic, monitor HR Other Brace: has abdominal binder and TEDs Restrictions Weight Bearing Restrictions: No    Has the patient had 2 or more falls or a fall with injury in the past year? Yes   Prior Activity Level Household: limited mobility with RW, has been falling recently, needs assist for all ADLs (recently hired an aid to help)   Prior Functional Level Self Care: Did the patient need help bathing, dressing, using the toilet or eating? Needed some help   Indoor Mobility: Did the patient need assistance with walking from room to room (with or without device)? Needed some help   Stairs: Did the patient need assistance with internal or external stairs (with or without device)? Needed some help   Functional Cognition: Did the patient need help planning regular tasks such as shopping or remembering to take medications? Needed some help   Patient Information Are you of Hispanic, Latino/a,or Spanish origin?: A. No, not of Hispanic, Latino/a, or Spanish origin What is your race?: A.  White Do you need or want an interpreter to communicate with a doctor or health care staff?: 0. No   Patient's Response To:  Health Literacy and Transportation Is the patient able to respond to health literacy and transportation needs?: Yes Health Literacy - How often do you need to have someone help you when you read instructions, pamphlets, or other written material from your doctor or pharmacy?: Never In the past 12 months, has lack of transportation kept you from medical appointments or from getting medications?: No In the past 12 months, has lack of transportation kept you from meetings, work, or from getting things needed for daily living?: No   Home Assistive Devices / Equipment Home Assistive Devices/Equipment: Cane (specify quad or straight), Eyeglasses, Dentures (specify type), Walker (specify type) Home Equipment: Cane - quad, Rollator (4 wheels)   Prior Device Use: Indicate devices/aids used by the patient prior to current illness, exacerbation or injury? Walker   Current Functional Level Cognition   Overall Cognitive Status: Within Functional Limits for tasks assessed Orientation Level: Oriented X4 General Comments: noted to have some memory issues with patient calling wife to ask where phone is during session after giving wife phone just 10 mins prior to complete task for him while in therapy. patient requested to call to verify even with education that patients wife had it    Extremity Assessment (includes Sensation/Coordination)   Upper Extremity Assessment: RUE deficits/detail, LUE deficits/detail, Generalized weakness RUE Deficits / Details: new tremor present RUE Coordination: decreased fine motor, decreased gross motor LUE Deficits / Details: new tremor   present L>R functionally getting in the way of independence LUE Coordination: decreased fine motor, decreased gross motor  Lower Extremity Assessment: Defer to PT evaluation     ADLs   Overall ADL's : Needs  assistance/impaired Eating/Feeding: Set up, Sitting Eating/Feeding Details (indicate cue type and reason): patient trialed weighted cuffs on LUE with reportes of decreased tremor like movement. patient was educated on weighted gloves with noted tremors in digits and patient reporting desire to be able to text and write as long as possible. patient was shown different options via computer. Grooming: Set up, Sitting Grooming Details (indicate cue type and reason): in recliner Upper Body Bathing: Minimal assistance, Sitting Lower Body Bathing: Minimal assistance, Sitting/lateral leans Upper Body Dressing : Set up Lower Body Dressing: Min guard, Sitting/lateral leans Lower Body Dressing Details (indicate cue type and reason): patient was able to don bilateral shoes with min guard sititng EOB Toilet Transfer: Min guard, Stand-pivot, Rolling walker (2 wheels) Toilet Transfer Details (indicate cue type and reason): simulated with recliner transfer Toileting- Clothing Manipulation and Hygiene: Minimal assistance Tub/ Shower Transfer: Tub transfer, Moderate assistance Tub/Shower Transfer Details (indicate cue type and reason): gets down in the tub because he gets too cold out of the water Functional mobility during ADLs: Min guard, Rolling walker (2 wheels) General ADL Comments: patient was noted to have HR increase to 148 bpm with standing at EOB. patient completed tranfer with min guard to recliner in room with increased time and cues for proper sequencing. patient's HR was noted to reduce to 98 bpm with rest in chair. patient reported feelign SOB while standing but dissapated when seated. nurse made aware.     Mobility   Overal bed mobility: Modified Independent Bed Mobility: Supine to Sit Supine to sit: Modified independent (Device/Increase time), HOB elevated General bed mobility comments: upon sitting EOB, had pt perform UE movements such as reaching, shoulder abduction and shoulder flexion for  approx 2 minutes prior to standing     Transfers   Overall transfer level: Needs assistance Equipment used: Rolling walker (2 wheels) Transfers: Sit to/from Stand Sit to Stand: Min assist Bed to/from chair/wheelchair/BSC transfer type:: Step pivot Step pivot transfers: Min assist General transfer comment: assist to rise and steady, pt marched in place approx 30 seconds and then returned to sitting due to fatigue; pt performed again min assist to rise and steady, pt able to control descent     Ambulation / Gait / Stairs / Wheelchair Mobility   Ambulation/Gait Ambulation/Gait assistance: Min assist, Min guard Gait Distance (Feet): 120 Feet Assistive device: Rolling walker (2 wheels) Gait Pattern/deviations: Decreased stride length, Step-through pattern, Trunk flexed General Gait Details: assist for stability, pt reported mild dizziness however states different type then symptoms from orthostasis; pt c/o chest "tightness" after ambulating however no pain, SpO2 98%; HR was elevated to 148 bpm during ambulation however 105 bpm upon sitting in recliner; recliner following during ambulation for safety Gait velocity: decreased     Posture / Balance Balance Overall balance assessment: Needs assistance Sitting-balance support: Feet supported Sitting balance-Leahy Scale: Good Standing balance support: During functional activity, Reliant on assistive device for balance Standing balance-Leahy Scale: Poor Standing balance comment: single UE support 2* dizziness     Special needs/care consideration Special service needs cancer treatment on hold, multimodal pain control    Previous Home Environment (from acute therapy documentation) Living Arrangements: Spouse/significant other Available Help at Discharge: Family, Available 24 hours/day Type of Home: House Home Layout: Two level, Bed/bath   upstairs Alternate Level Stairs-Rails: Can reach both Alternate Level Stairs-Number of Steps: 13 Home  Access: Stairs to enter Entrance Stairs-Rails: Right, Left Entrance Stairs-Number of Steps: 6 Bathroom Shower/Tub: Tub/shower unit Home Care Services: Yes Additional Comments: daughter staying through mid september to assist, [place] of state. Been sleeping downstairs on couch. 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   Discharge Living Setting Plans for Discharge Living Setting: Patient's home, Lives with (comment) (spouse) Type of Home at Discharge: House Discharge Home Layout: Two level, Bed/bath upstairs Alternate Level Stairs-Rails: Can reach both Alternate Level Stairs-Number of Steps: 13 Discharge Home Access: Stairs to enter Entrance Stairs-Rails: Right, Left Entrance Stairs-Number of Steps: 6 Discharge Bathroom Shower/Tub: Tub/shower unit Discharge Bathroom Toilet: Standard Discharge Bathroom Accessibility: Yes How Accessible: Accessible via walker Does the patient have any problems obtaining your medications?: No   Social/Family/Support Systems Patient Roles: Spouse Anticipated Caregiver: spouse, Julie Anticipated Caregiver's Contact Information: [phone] Ability/Limitations of Caregiver: supervision Caregiver Availability: 24/7 Discharge Plan Discussed with Primary Caregiver: Yes Is Caregiver In Agreement with Plan?: Yes Does Caregiver/Family have Issues with Lodging/Transportation while Pt is in Rehab?: No   Goals Patient/Family Goal for Rehab: PT/OT supervision, SLP n/a Expected length of stay: 9-11 days Additional Information: cancer trial from [facility] on hold while inpatient Pt/Family Agrees to Admission and willing to participate: Yes Program Orientation Provided & Reviewed with Pt/Caregiver Including Roles  & Responsibilities: Yes  Barriers to Discharge: Home environment access/layout, Decreased caregiver support   Decrease burden of Care through IP rehab admission: n/a   Possible need for SNF placement upon discharge: not anticipated.     Patient Condition: I have reviewed medical records from [facility], spoken with CM, and patient and spouse. I discussed via phone for inpatient rehabilitation assessment.  Patient will benefit from ongoing PT and OT, can actively participate in 3 hours of therapy a day 5 days of the week, and can make measurable gains during the admission.  Patient will also benefit from the coordinated team approach during an Inpatient Acute Rehabilitation admission.  The patient will receive intensive therapy as well as Rehabilitation physician, nursing, social worker, and care management interventions.  Due to safety, skin/wound care, disease management, medication administration, pain management, and patient education the patient requires 24 hour a day rehabilitation nursing.  The patient is currently min assist with mobility and basic ADLs.  Discharge setting and therapy post discharge at home with home health is anticipated.  Patient has agreed to participate in the Acute Inpatient Rehabilitation Program and will admit today.   Preadmission Screen Completed By:  Caitlin E Warren, PT, DPT 05/13/2022 10:17 AM ______________________________________________________________________   Discussed status with Dr. Shemia Bevel on 05/13/22  at 10:17 AM  and received approval for admission today.   Admission Coordinator:  Caitlin E Warren, PT, DPT time 10:17 AM /Date 05/13/22     Assessment/Plan: Diagnosis: Does the need for close, 24 hr/day Medical supervision in concert with the patient's rehab needs make it unreasonable for this patient to be served in a less intensive setting? 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 Yes Does the patient require coordinated care of a physician, rehab nurse, PT, OT, to address physical and functional deficits in the context of the

## 2022-05-12 NOTE — Progress Notes (Signed)
Port needle in need of exchange.  Pt no longer receiving IV medication or fluids and is awaiting placement at CIR.  Ok to deaccess port at this time per MD.

## 2022-05-12 NOTE — Progress Notes (Signed)
Inpatient Rehab Admissions Coordinator:   Note weaning to PO pain regimen.  Will continue to follow.   Shann Medal, PT, DPT Admissions Coordinator [phone] 05/12/22  9:12 AM

## 2022-05-12 NOTE — Progress Notes (Addendum)
Rounding Note    Patient Name: [name] Date of Encounter: 05/12/2022  [place] Cardiologist: Larae Grooms, MD   Subjective   Not wearing his compression hose or abdominal binder this am.  He says that he needed to take a break and is wearing at night instead.  HR shot up into the 140's when he got up and went to the bathroom.  Orthostatics pending this am. Increased Midodrine to 89m TID yesterday  Inpatient Medications    Scheduled Meds:  chlorhexidine gluconate (MEDLINE KIT)  15 mL Mouth Rinse BID   Chlorhexidine Gluconate Cloth  6 each Topical Daily   midodrine  10 mg Oral TID WC   morphine  60 mg Oral Q12H   mouth rinse  15 mL Mouth Rinse 10 times per day   rivaroxaban  20 mg Oral QPM   Continuous Infusions:  PRN Meds: alum & mag hydroxide-simeth, benzonatate, bisacodyl, HYDROmorphone, hydrOXYzine, methocarbamol, ondansetron **OR** ondansetron (ZOFRAN) IV   Vital Signs    Vitals:   05/11/22 1429 05/11/22 1554 05/11/22 2208 05/12/22 0550  BP: 105/69 111/67 96/63 121/69  Pulse: 83 76 86 70  Resp: _0 Temp: 98.7 F (37.1 C)  100.2 F (37.9 C) 98.6 F (37 C)  TempSrc: Oral  Oral Oral  SpO2: 100%  96% 99%  Weight:      Height:        Intake/Output Summary (Last 24 hours) at 05/12/2022 1110 Last data filed at 05/12/2022 0915 Gross per 24 hour  Intake 480 ml  Output --  Net 480 ml       05/03/2022    9:25 PM 03/30/2022    8:26 AM 03/22/2022    3:56 PM  Last 3 Weights  Weight (lbs) 138 lb 147 lb 14.4 oz 151 lb 14.4 oz  Weight (kg) 62.596 kg 67.087 kg 68.9 kg      Telemetry    NSR - Personally Reviewed  ECG    NSR with no ST changes - Personally Reviewed  Physical Exam   GEN: Well nourished, well developed in no acute distress HEENT: Normal NECK: No JVD; No carotid bruits LYMPHATICS: No lymphadenopathy CARDIAC:RRR, no murmurs, rubs, gallops RESPIRATORY:  Clear to auscultation without rales, wheezing or rhonchi   ABDOMEN: Soft, non-tender, non-distended MUSCULOSKELETAL:  No edema; No deformity  SKIN: Warm and dry NEUROLOGIC:  Alert and oriented x 3 PSYCHIATRIC:  Normal affect   Labs    High Sensitivity Troponin:   Recent Labs  Lab 04/20/22 1144 04/20/22 1344  TROPONINIHS 4 4      Chemistry Recent Labs  Lab 05/08/22 0457 05/10/22 1300  NA 139 139  K 3.3* 3.7  CL 110 106  CO2 24 26  GLUCOSE 97 112*  BUN 7* 15  CREATININE 0.69 0.72  CALCIUM 8.6* 8.9  GFRNONAA >60 >60  ANIONGAP 5 7     Lipids No results for input(s): "CHOL", "TRIG", "HDL", "LABVLDL", "LDLCALC", "CHOLHDL" in the last 168 hours.  Hematology Recent Labs  Lab 05/08/22 0457  WBC 5.1  RBC 3.28*  HGB 10.0*  HCT 31.4*  MCV 95.7  MCH 30.5  MCHC 31.8  RDW 14.1  PLT 217    Thyroid No results for input(s): "TSH", "FREET4" in the last 168 hours.  BNPNo results for input(s): "BNP", "PROBNP" in the last 168 hours.  DDimer No results for input(s): "DDIMER" in the last 168 hours.   Radiology    No results found.  Cardiac Studies     Patient Profile     66 y.o. 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 Karleen Hampshire.   Assessment & Plan    Orthostatic Hypotension  Falls  - Patient was started a chemotherapy trial with [facility] in 03/2022. For the past few weeks, patient has had multiple falls. Feels like he has "spaced out" during some fo the falls  - EEG showed mild diffuse encephalopathy without seizure activity  - Patient has been very orthostatic here-- noted that orthostatic vital signs on 9/9 showed BP 112/72 lying and HR 73 lying, BP 81/61 standing and HR 117 standing  - Holding home BP medications  -  Patient reports good PO intake  - Placed thigh-high compression stockings and abdominal binder  - Started midodrine 5 mg at 7 AM, noon, 4 PM but remained profoundly orthostatic yesterday so Midodrine increased to 23m TID - Most recent echo from 11/2021 showed EF 55-60%.  - AM cortisol normal - HR up in 140;s this am but did not have on compression hose or binder - I again stressed the importance of placing Compression hose and abdominal  binder first thing in the am and take off at bedtime daily  - recheck orthostatic BPs this afternoon after compression hose and binder are back on  CAD s/p CABG  - Not on antiplatelet due to need for xarelto  - Not on BB due to orthostatic hypotension   Chronic Anticoagulation  Paroxysmal Atrial Fibrillation  Recent PE  - Continue xarelto 20 mg daily        For questions or updates, please contact CDoradoPlease consult [email or web address] for contact info under        Signed, TFransico Him MD  05/12/2022, 11:10 AM

## 2022-05-12 NOTE — Plan of Care (Signed)
Pt c/o pain 7-8/10.  PRNs administered per MAR.  Problem: Pain Managment: Goal: General experience of comfort will improve Outcome: Not Progressing   Problem: Education: Goal: Knowledge of General Education information will improve Description: Including pain rating scale, medication(s)/side effects and non-pharmacologic comfort measures Outcome: Progressing

## 2022-05-12 NOTE — Consult Note (Signed)
Palliative Care Consult Note                                  Date: 05/12/2022   Patient Name: [name]  DOB: Nov 02, 1955  MRN: [id number]  Age / Sex: 66 y.o., male  PCP: Anthony Morales, MD Referring Physician: Hosie Poisson, MD  Reason for Consultation: Establishing goals of care  HPI/Patient Profile: Palliative Care consult requested for goals of care discussion in this 66 y.o. 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 He was admitted on 05/03/2022 from home with frequent falls and weakness. Found to have significant orthostatic hypotension. Followed by Cardiology.   Past Medical History:  Diagnosis Date   Anemia    Anxiety    Arthritis    Basal cell carcinoma (BCC) of forehead    CAD (coronary artery disease)    a. 10/2015 ant STEMI >> LHC with 3 v CAD; oLAD tx with POBA >> emergent CABG. b. Multiple evals since that time, early graft failure of SVG-RCA by cath 03/2016. c. 2/19 PCI/DES x1 to pRCA, normal EF.   Carotid artery disease ([place])    a. 40-59% BICA 02/2018.   Depression    Dyspnea    Ectopic atrial tachycardia (HCC)    Esophageal reflux    eosinophil esophagitis   Family history of adverse reaction to anesthesia    "sister has PONV" (06/21/2017)   Former tobacco use    Gout    Hepatitis C    "treated and cured" (06/21/2017)   High cholesterol    History of blood transfusion    History of kidney stones    Hypertension    Ischemic cardiomyopathy    a. EF 25-30% at  intraop TEE 4/17  //  b. Limited Echo 5/17 - EF 45-50%, mild ant HK. c. EF 55-65% by cath 09/2017.   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[place])    Migraine    "3-4/yr" (06/21/2017)   Myocardial infarction ([place]) 10/2015   Palpitations    Pneumonia    Sinus bradycardia    a. HR dropping into 40s in 02/2016 -> BB reduced.   Stroke [facility]) 10/2016   "small one; sometimes my memory/cognitive issues" (06/21/2017)   Symptomatic hypotension    a. 02/2016 ER visit -> meds reduced.   Syncope    Wears dentures    Wears glasses      Subjective:   This NP [name] reviewed medical records, received report from team, assessed the patient and then met at the patient's bedside with [name] and [place], RN to discuss diagnosis, prognosis, GOC, EOL wishes disposition and options.   Patient is well-known to myself and our palliative team. I am actively following him at [facility] for support and symptom management.   [name] is sitting upright in bed. 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 Patient with permission to have discussions in friend's presence.   We discussed His current illness and what it means in the larger context of His on-going co-morbidities. Natural disease trajectory  and expectations were discussed.  [name] verbalized understanding of current illness and co-morbidities. He is realistic in his understanding. States he is remaining hopeful for some stability and the opportunity to continue to thrive. Shares "my body doesn't feel like what all the reports are saying!" He is feeling much better. Acknowledges weakness and fatigue. Pain is controlled.   He is questioning if the recommendations were for hospice why would the efforts to get him to rehab been made and option to him. Acknowledge his statement discussing his wishes. He is clear in expressed wishes to continue to treat the treatable allowing him and opportunity to thrive but also with more awareness that his  quality of life is becoming more important than his quantity. He does not wish to suffer but also is not ready to focus solely on his comfort.   States he is not ready for hospice "yet" but knows in the future this will be necessary.   I discussed the importance of continued conversation with family and their medical providers regarding overall plan of care and treatment options, ensuring decisions are within the context of the patients values and GOCs.  Questions and concerns were addressed.  Patient was encouraged to call with questions or concerns.  PMT will continue to support holistically as needed.  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)  Hyponatremia  AF (paroxysmal atrial fibrillation) (HCC)   Scheduled Meds:  chlorhexidine gluconate (MEDLINE KIT)  15 mL Mouth Rinse BID   Chlorhexidine Gluconate Cloth  6 each Topical Daily   midodrine  10 mg Oral TID WC   morphine  60 mg Oral Q12H   mouth rinse  15 mL Mouth Rinse 10 times per day   rivaroxaban  20 mg Oral QPM    Continuous Infusions:   PRN Meds: alum & mag hydroxide-simeth, benzonatate, bisacodyl, HYDROmorphone, hydrOXYzine, methocarbamol, ondansetron **OR** ondansetron (ZOFRAN) IV  Allergies  Allergen Reactions   Prednisone Other (See Comments)    States that this med makes him "crazy"   Tetanus Toxoids Swelling and Other (See Comments)    Fever, Swelling of the arm    Wellbutrin [Bupropion] Other (See Comments)    Crazy thoughts, nightmares   Varenicline Other (See Comments)    Unpleasant dreams    Review of Systems  Constitutional:  Positive for fatigue.  Musculoskeletal:  Positive for arthralgias.  Neurological:  Positive for weakness.  Unless otherwise noted, a complete review of systems is negative.  Physical  Exam General: NAD, thin, chronically-ill appearing Cardiovascular: regular rate and rhythm Pulmonary: normal breathing pattern  Extremities: no edema, no joint deformities Skin: no rashes, warm and dry Neurological: AAO x3, mood appropriate   Vital Signs:  BP 119/75 (BP Location: Left Arm)   Pulse 61   Temp 98.8 F (37.1 C) (Oral)   Resp 16   Ht $R'5\' 9"'vt$  (1.753 m)   Wt 62.6 kg   SpO2 100%   BMI 20.38 kg/m  Pain Scale: 0-10 POSS *See Group Information*: 1-Acceptable,Awake and alert Pain Score: 8   SpO2: SpO2: 100 % O2 Device:SpO2: 100 % O2 Flow Rate: .   IO: Intake/output summary:  Intake/Output Summary (Last 24 hours) at 05/12/2022 1603 Last data filed at 05/12/2022 0915 Gross per 24 hour  Intake 240 ml  Output --  Net 240 ml    LBM: Last BM  Date : 05/10/22 Baseline Weight: Weight: 62.6 kg Most recent weight: Weight: 62.6 kg      Palliative Assessment/Data:    Advanced Care Planning:   Primary Decision Maker: PATIENT  Code Status/Advance Care Planning: DNR   Assessment & Plan:   SUMMARY OF RECOMMENDATIONS   DNR/DNI-as confirmed  Continue with current plan of care  Extensive goals of care discussions. Jocob is clear in his expressed wishes to continue to treat the treatable allowing him every opportunity to thrive. He is becoming more aware that his quality of life is more important than quantity. Would not wish to suffer. Is not ready to consider comfort or hospice level of care but realistic in his understanding this will be most appropriate in the future. He is looking forward to transitioning to CIR/Rehab. PMT will continue to support and follow as needed. Please secure chat with urgent needs.  Symptom Management:  Neoplasm related pain MS Contin 60 mg twice daily  Dilaudid 83m every 4 hours as needed for breakthrough pain Constipation Dulcolax 578mas needed Nausea  Zofran 71m39ms needed Anxiety  Hydroxyzine 56m36m needed   Palliative Prophylaxis:   Bowel Regimen and Frequent Pain Assessment  Additional Recommendations (Limitations, Scope, Preferences): Continue to treat the treatable   Psycho-social/Spiritual:  Desire for further Chaplaincy support: no Additional Recommendations:  ongoing goals of care discussions   Prognosis:  Guarded  Discharge Planning:  To Be Determined ? CIR    Patient expressed understanding and was in agreement with this plan.   Time Total: 50 min   Visit consisted of counseling and education dealing with the complex and emotionally intense issues of symptom management and palliative care in the setting of serious and potentially life-threatening illness.Greater than 50%  of this time was spent counseling and coordinating care related to the above assessment and plan.  Signed by:  NikkAlda LeaPCNP-BC Palliative Medicine Team  Phone: [phone]: [phone]: N. CBjorn Pippinhank you for allowing the Palliative Medicine Team to assist in the care of this patient. Please utilize secure chat with additional questions, if there is no response within 30 minutes please call the above phone number. Palliative Medicine Team providers are available by phone from 7am to 5pm daily and can be reached through the team cell phone.  Should this patient require assistance outside of these hours, please call the patient's attending physician.

## 2022-05-12 NOTE — Progress Notes (Signed)
PROGRESS NOTE    [name]  DJT:[id number] DOB: 05/12/1956 DOA: 05/03/2022 PCP: [name], MD    Chief Complaint  Patient presents with   Weakness    Brief Narrative:   [name] is a 66 y.o. 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 Presenting with falls.   Patient underwent CT Head, Chest, abdomen and pelvis along with cervical spine and results reviewed with the patient.  Neurology consulted, recommended getting EEG, MRI Cervical and thoracic spine.  Discussed the results of the MRI with the patient and family. EEG does not show any active seizures. Neurology signed off.  Patient was found to be in severe orthostatic hypotension. He was started on compression stockings up to thigh, abdominal binder, stopped the ativan and Cymbalta and started him on Midodrine with minimal improvement in the orthostatic bp measurements.  Cardiology consulted for severe orthostatic hypotension and recommendations given.  Meanwhile therapy eval recommending CIR.  Waiting for orthostatics today. Discontinued IV dilaudid today. Patient is agreeable .     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 EEG shows mild diffuse encephalopathy,  No seizures or epileptiform discharges were seen throughout the recording. Neurology recommending MRI of  the C-spine and thoracic spine for further evaluation.  Discussed the results of the MRI of the C spine and thoracic spine with the patient and daughter on the phone.   - daughter reports his symptoms of unresponsiveness and multiple falls are mostly when gets up . He was found to have significant orthostatic hypotension which would explain his dizzy spells. .    Orthostatic hypotension -Persistent hypotension on standing, with dizziness.  -Gentle hydration, compression stockings up to the thigh, abdominal binder . - midodrine added, discontinued the ativan and Cymbalta for now. Increased the dose of midodrine to 10 mg TID due to persistent orthostatic hypotension.  - suspect probably from autonomic dysfunction from chemotherapy? - appreciate cardiology input, will start him on mestinon if he remains orthostatic today.  - carotid duplex less than a year is wnl.    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 Recommend outpatient follow-up with oncology at [facility]. Patient enrolled in clinical trial at [facility], The Alirocumab dose was due today, Pharmacy contacted his trial study coordinator this morning who said standard practice is to hold it while admitted.  Discussed the results of the MRI with the daughter and patient, unable to open films from February.    PAF  Rate controlled.  On Xarelto for anti coagulation.    CAD s/p PCI, Currently chest pain free.     Anemia of chronic disease:  Transfuse to keep hemoglobin greater than 7.  Currently hemoglobin stable at 10.     Hyponatremia:  Resolved.   Hypokalemia:  Replaced.  Recheck today.   Some nausea and an episode of dry heaving earlier  this am.  Abd x rays ordered , showed Small nonobstructive right renal calculus. No abnormal bowel dilatation is noted.  Patient reports he is feeling better lately when compared to the last 1 month.  He is agreeable  to stopping the IV pain meds and work with oral pain meds.  Plan for cir when bed is available.    DVT prophylaxis: Xarelto.  Code Status: DNR.  Family Communication: none at bedside,  Disposition:   Status is: Inpatient Remains inpatient appropriate because: waiting for CIR.    Level of care: Telemetry Consultants:  Neurology.  CARDIOLOGY  Procedures: MRI c and thoracic spine.   Antimicrobials: none.    Subjective: No nausea, vomiting or abdominal pain.   Objective: Vitals:   05/11/22 1429 05/11/22 1554 05/11/22 2208 05/12/22 0550  BP: 105/69 111/67 96/63 121/69  Pulse: 83 76 86 70  Resp: _0 Temp: 98.7 F (37.1 C)  100.2 F (37.9 C) 98.6 F (37 C)  TempSrc: Oral  Oral Oral  SpO2: 100%  96% 99%  Weight:      Height:        Intake/Output Summary (Last 24 hours) at 05/12/2022 1226 Last data filed at 05/12/2022 0915 Gross per 24 hour  Intake 480 ml  Output --  Net 480 ml    Filed Weights   05/03/22 2125  Weight: 62.6 kg    Examination:  General exam: Appears calm and comfortable  Respiratory system: Clear to auscultation. Respiratory effort normal. Cardiovascular system: S1 & S2 heard, RRR. No JVD,  No pedal edema. Gastrointestinal system: Abdomen is nondistended, soft and nontender.  Normal bowel sounds heard. Central nervous system: Alert and oriented. No focal neurological deficits. Extremities: Symmetric 5 x 5 power. Skin: No rashes, lesions or ulcers Psychiatry: Mood & affect appropriate.      Data Reviewed: I have personally reviewed following labs and imaging studies  CBC: Recent Labs  Lab 05/08/22 0457  WBC 5.1  NEUTROABS 3.1  HGB 10.0*  HCT 31.4*  MCV 95.7  PLT 217     Basic Metabolic Panel: Recent Labs  Lab 05/08/22 0457 05/10/22 1300  NA 139 139  K 3.3* 3.7  CL 110 106  CO2 24 26  GLUCOSE 97 112*  BUN 7* 15  CREATININE 0.69 0.72  CALCIUM 8.6* 8.9     GFR: Estimated Creatinine Clearance: 79.3 mL/min (by  C-G formula based on SCr of 0.72 mg/dL).  Liver Function Tests: No results for input(s): "AST", "ALT", "ALKPHOS", "BILITOT", "PROT", "ALBUMIN" in the last 168 hours.   CBG: No results for input(s): "GLUCAP" in the last 168 hours.    Recent Results (from the past 240 hour(s))  Culture, blood (routine x 2)     Status: None   Collection Time: 05/03/22 11:24 PM   Specimen: BLOOD  Result Value Ref Range Status   Specimen Description   Final    BLOOD Performed at [facility], [address] [place], [place] [address]    Special Requests   Final    BOTTLES DRAWN AEROBIC AND ANAEROBIC Blood Culture results may not be optimal due to an excessive volume of blood received in culture bottles Performed at [facility], [address] [place], [place] [address]    Culture   Final    NO GROWTH 5 DAYS Performed at [facility], [place] [address] [place], [place] [address]    Report Status 05/08/2022 FINAL  Final  Culture, blood (routine x 2)     Status: None   Collection Time: 05/04/22  3:19 PM   Specimen: BLOOD  Result Value Ref Range Status   Specimen Description   Final    BLOOD BLOOD LEFT ARM Performed at [facility], [address] [place], [place] [address]    Special Requests   Final    BOTTLES DRAWN AEROBIC ONLY Blood Culture adequate volume Performed at [facility], [address] [place], [place] [address]    Culture   Final    NO GROWTH 5 DAYS Performed at [facility], [address] [place], [place] [address]    Report Status 05/09/2022 FINAL  Final         Radiology Studies: No results found.      Scheduled Meds:  chlorhexidine gluconate (MEDLINE KIT)  15 mL Mouth Rinse BID   Chlorhexidine Gluconate Cloth  6 each Topical Daily   midodrine  10 mg Oral TID WC   morphine  60 mg Oral Q12H   mouth rinse  15 mL Mouth Rinse 10 times per day   rivaroxaban  20 mg Oral QPM   Continuous  Infusions:      LOS: 8 days         , MD [facility]   To contact the attending provider between 7A-7P or the covering provider during after hours 7P-7A, please log into the web site [email or web address] and access using universal [place] password for that web site. If you do not have the password, please call the hospital operator.  05/12/2022, 12:26 PM    

## 2022-05-13 ENCOUNTER — Encounter (HOSPITAL_COMMUNITY): Payer: Self-pay | Admitting: Physical Medicine and Rehabilitation

## 2022-05-13 ENCOUNTER — Other Ambulatory Visit: Payer: Self-pay

## 2022-05-13 ENCOUNTER — Inpatient Hospital Stay (HOSPITAL_COMMUNITY)
Admission: RE | Admit: 2022-05-13 | Discharge: 2022-05-24 | DRG: 945 | Disposition: A | Discharge status: Home / Self Care | Payer: Medicare Other | Source: Intra-hospital | Attending: Physical Medicine and Rehabilitation | Admitting: Physical Medicine and Rehabilitation

## 2022-05-13 DIAGNOSIS — R110 Nausea: Secondary | ICD-10-CM | POA: Diagnosis present

## 2022-05-13 DIAGNOSIS — G939 Disorder of brain, unspecified: Secondary | ICD-10-CM | POA: Diagnosis present

## 2022-05-13 DIAGNOSIS — Z23 Encounter for immunization: Secondary | ICD-10-CM

## 2022-05-13 DIAGNOSIS — Z87891 Personal history of nicotine dependence: Secondary | ICD-10-CM

## 2022-05-13 DIAGNOSIS — G250 Essential tremor: Secondary | ICD-10-CM | POA: Diagnosis present

## 2022-05-13 DIAGNOSIS — Z681 Body mass index (BMI) 19 or less, adult: Secondary | ICD-10-CM | POA: Diagnosis not present

## 2022-05-13 DIAGNOSIS — Z85828 Personal history of other malignant neoplasm of skin: Secondary | ICD-10-CM

## 2022-05-13 DIAGNOSIS — C7A8 Other malignant neuroendocrine tumors: Secondary | ICD-10-CM

## 2022-05-13 DIAGNOSIS — C7951 Secondary malignant neoplasm of bone: Secondary | ICD-10-CM | POA: Diagnosis present

## 2022-05-13 DIAGNOSIS — C7931 Secondary malignant neoplasm of brain: Secondary | ICD-10-CM | POA: Diagnosis present

## 2022-05-13 DIAGNOSIS — Z823 Family history of stroke: Secondary | ICD-10-CM

## 2022-05-13 DIAGNOSIS — E7800 Pure hypercholesterolemia, unspecified: Secondary | ICD-10-CM | POA: Diagnosis present

## 2022-05-13 DIAGNOSIS — Z66 Do not resuscitate: Secondary | ICD-10-CM | POA: Diagnosis present

## 2022-05-13 DIAGNOSIS — R5381 Other malaise: Principal | ICD-10-CM | POA: Diagnosis present

## 2022-05-13 DIAGNOSIS — M1990 Unspecified osteoarthritis, unspecified site: Secondary | ICD-10-CM | POA: Diagnosis present

## 2022-05-13 DIAGNOSIS — F319 Bipolar disorder, unspecified: Secondary | ICD-10-CM | POA: Diagnosis present

## 2022-05-13 DIAGNOSIS — D3A8 Other benign neuroendocrine tumors: Secondary | ICD-10-CM | POA: Diagnosis present

## 2022-05-13 DIAGNOSIS — I2510 Atherosclerotic heart disease of native coronary artery without angina pectoris: Secondary | ICD-10-CM | POA: Diagnosis present

## 2022-05-13 DIAGNOSIS — Z8249 Family history of ischemic heart disease and other diseases of the circulatory system: Secondary | ICD-10-CM

## 2022-05-13 DIAGNOSIS — T43595A Adverse effect of other antipsychotics and neuroleptics, initial encounter: Secondary | ICD-10-CM | POA: Diagnosis present

## 2022-05-13 DIAGNOSIS — N1831 Chronic kidney disease, stage 3a: Secondary | ICD-10-CM | POA: Diagnosis present

## 2022-05-13 DIAGNOSIS — E46 Unspecified protein-calorie malnutrition: Secondary | ICD-10-CM | POA: Diagnosis present

## 2022-05-13 DIAGNOSIS — Z951 Presence of aortocoronary bypass graft: Secondary | ICD-10-CM | POA: Diagnosis not present

## 2022-05-13 DIAGNOSIS — F419 Anxiety disorder, unspecified: Secondary | ICD-10-CM | POA: Diagnosis present

## 2022-05-13 DIAGNOSIS — I129 Hypertensive chronic kidney disease with stage 1 through stage 4 chronic kidney disease, or unspecified chronic kidney disease: Secondary | ICD-10-CM | POA: Diagnosis present

## 2022-05-13 DIAGNOSIS — E559 Vitamin D deficiency, unspecified: Secondary | ICD-10-CM | POA: Diagnosis present

## 2022-05-13 DIAGNOSIS — I739 Peripheral vascular disease, unspecified: Secondary | ICD-10-CM | POA: Diagnosis present

## 2022-05-13 DIAGNOSIS — I252 Old myocardial infarction: Secondary | ICD-10-CM

## 2022-05-13 DIAGNOSIS — Z887 Allergy status to serum and vaccine status: Secondary | ICD-10-CM

## 2022-05-13 DIAGNOSIS — R000 Tachycardia, unspecified: Secondary | ICD-10-CM | POA: Diagnosis present

## 2022-05-13 DIAGNOSIS — Z888 Allergy status to other drugs, medicaments and biological substances status: Secondary | ICD-10-CM

## 2022-05-13 DIAGNOSIS — Z9221 Personal history of antineoplastic chemotherapy: Secondary | ICD-10-CM

## 2022-05-13 DIAGNOSIS — R4189 Other symptoms and signs involving cognitive functions and awareness: Secondary | ICD-10-CM | POA: Diagnosis present

## 2022-05-13 DIAGNOSIS — Z79899 Other long term (current) drug therapy: Secondary | ICD-10-CM

## 2022-05-13 DIAGNOSIS — M79671 Pain in right foot: Secondary | ICD-10-CM | POA: Diagnosis not present

## 2022-05-13 DIAGNOSIS — M797 Fibromyalgia: Secondary | ICD-10-CM | POA: Diagnosis present

## 2022-05-13 DIAGNOSIS — I951 Orthostatic hypotension: Secondary | ICD-10-CM | POA: Diagnosis not present

## 2022-05-13 DIAGNOSIS — I255 Ischemic cardiomyopathy: Secondary | ICD-10-CM | POA: Diagnosis present

## 2022-05-13 DIAGNOSIS — I25709 Atherosclerosis of coronary artery bypass graft(s), unspecified, with unspecified angina pectoris: Secondary | ICD-10-CM | POA: Diagnosis not present

## 2022-05-13 DIAGNOSIS — T43215A Adverse effect of selective serotonin and norepinephrine reuptake inhibitors, initial encounter: Secondary | ICD-10-CM | POA: Diagnosis present

## 2022-05-13 DIAGNOSIS — Z8673 Personal history of transient ischemic attack (TIA), and cerebral infarction without residual deficits: Secondary | ICD-10-CM

## 2022-05-13 DIAGNOSIS — Z801 Family history of malignant neoplasm of trachea, bronchus and lung: Secondary | ICD-10-CM

## 2022-05-13 DIAGNOSIS — D631 Anemia in chronic kidney disease: Secondary | ICD-10-CM | POA: Diagnosis present

## 2022-05-13 DIAGNOSIS — R52 Pain, unspecified: Secondary | ICD-10-CM | POA: Diagnosis not present

## 2022-05-13 DIAGNOSIS — R400 Somnolence: Secondary | ICD-10-CM | POA: Diagnosis present

## 2022-05-13 DIAGNOSIS — R531 Weakness: Secondary | ICD-10-CM | POA: Diagnosis not present

## 2022-05-13 DIAGNOSIS — Z515 Encounter for palliative care: Secondary | ICD-10-CM

## 2022-05-13 DIAGNOSIS — M109 Gout, unspecified: Secondary | ICD-10-CM | POA: Diagnosis present

## 2022-05-13 DIAGNOSIS — G43909 Migraine, unspecified, not intractable, without status migrainosus: Secondary | ICD-10-CM | POA: Diagnosis present

## 2022-05-13 DIAGNOSIS — Z7189 Other specified counseling: Secondary | ICD-10-CM | POA: Diagnosis not present

## 2022-05-13 DIAGNOSIS — K5900 Constipation, unspecified: Secondary | ICD-10-CM | POA: Diagnosis not present

## 2022-05-13 DIAGNOSIS — G4700 Insomnia, unspecified: Secondary | ICD-10-CM | POA: Diagnosis present

## 2022-05-13 DIAGNOSIS — W19XXXD Unspecified fall, subsequent encounter: Secondary | ICD-10-CM | POA: Diagnosis not present

## 2022-05-13 DIAGNOSIS — I480 Paroxysmal atrial fibrillation: Secondary | ICD-10-CM | POA: Diagnosis present

## 2022-05-13 DIAGNOSIS — G893 Neoplasm related pain (acute) (chronic): Secondary | ICD-10-CM | POA: Diagnosis present

## 2022-05-13 DIAGNOSIS — E871 Hypo-osmolality and hyponatremia: Secondary | ICD-10-CM | POA: Diagnosis not present

## 2022-05-13 DIAGNOSIS — Z923 Personal history of irradiation: Secondary | ICD-10-CM

## 2022-05-13 DIAGNOSIS — R410 Disorientation, unspecified: Secondary | ICD-10-CM | POA: Diagnosis present

## 2022-05-13 DIAGNOSIS — Z7901 Long term (current) use of anticoagulants: Secondary | ICD-10-CM

## 2022-05-13 DIAGNOSIS — R296 Repeated falls: Secondary | ICD-10-CM | POA: Diagnosis present

## 2022-05-13 MED ORDER — PROCHLORPERAZINE EDISYLATE 10 MG/2ML IJ SOLN
5.0000 mg | Freq: Four times a day (QID) | INTRAMUSCULAR | Status: DC | PRN
  Administered 2022-05-13 – 2022-05-24 (×26): 5 mg via INTRAVENOUS
  Filled 2022-05-13 (×27): qty 2

## 2022-05-13 MED ORDER — TRAZODONE HCL 50 MG PO TABS
50.0000 mg | ORAL_TABLET | Freq: Every evening | ORAL | Status: DC | PRN
  Administered 2022-05-13: 50 mg via ORAL
  Filled 2022-05-13 (×2): qty 1

## 2022-05-13 MED ORDER — INFLUENZA VAC A&B SA ADJ QUAD 0.5 ML IM PRSY
0.5000 mL | PREFILLED_SYRINGE | INTRAMUSCULAR | Status: AC
  Administered 2022-05-14: 0.5 mL via INTRAMUSCULAR
  Filled 2022-05-13: qty 0.5

## 2022-05-13 MED ORDER — HYDROXYZINE HCL 10 MG PO TABS: 10.0000 mg | ORAL_TABLET | Freq: Three times a day (TID) | ORAL | 0 refills | Status: DC | PRN

## 2022-05-13 MED ORDER — PNEUMOCOCCAL 20-VAL CONJ VACC 0.5 ML IM SUSY
0.5000 mL | PREFILLED_SYRINGE | INTRAMUSCULAR | Status: AC
  Administered 2022-05-14: 0.5 mL via INTRAMUSCULAR
  Filled 2022-05-13: qty 0.5

## 2022-05-13 MED ORDER — HYDROMORPHONE HCL 2 MG PO TABS
4.0000 mg | ORAL_TABLET | ORAL | Status: DC | PRN
  Administered 2022-05-13 – 2022-05-22 (×18): 4 mg via ORAL
  Filled 2022-05-13 (×16): qty 2

## 2022-05-13 MED ORDER — ACETAMINOPHEN 325 MG PO TABS
325.0000 mg | ORAL_TABLET | ORAL | Status: DC | PRN
  Administered 2022-05-13 – 2022-05-24 (×6): 650 mg via ORAL
  Filled 2022-05-13 (×6): qty 2

## 2022-05-13 MED ORDER — SODIUM CHLORIDE 0.9% FLUSH
10.0000 mL | INTRAVENOUS | Status: DC | PRN
  Administered 2022-05-17 – 2022-05-24 (×3): 10 mL

## 2022-05-13 MED ORDER — PROCHLORPERAZINE 25 MG RE SUPP: 12.5000 mg | Freq: Four times a day (QID) | RECTAL | Status: DC | PRN

## 2022-05-13 MED ORDER — RIVAROXABAN 20 MG PO TABS
20.0000 mg | ORAL_TABLET | Freq: Every evening | ORAL | Status: DC
  Administered 2022-05-13 – 2022-05-23 (×11): 20 mg via ORAL
  Filled 2022-05-13 (×12): qty 1

## 2022-05-13 MED ORDER — MORPHINE SULFATE ER 15 MG PO TBCR
60.0000 mg | EXTENDED_RELEASE_TABLET | Freq: Two times a day (BID) | ORAL | Status: DC
  Administered 2022-05-13 – 2022-05-24 (×22): 60 mg via ORAL
  Filled 2022-05-13 (×23): qty 4

## 2022-05-13 MED ORDER — FLEET ENEMA 7-19 GM/118ML RE ENEM: 1.0000 | ENEMA | Freq: Once | RECTAL | Status: DC | PRN

## 2022-05-13 MED ORDER — MIDODRINE HCL 10 MG PO TABS: 10.0000 mg | ORAL_TABLET | Freq: Three times a day (TID) | ORAL | Status: DC

## 2022-05-13 MED ORDER — PROCHLORPERAZINE MALEATE 5 MG PO TABS
5.0000 mg | ORAL_TABLET | Freq: Four times a day (QID) | ORAL | Status: DC | PRN
  Administered 2022-05-19: 10 mg via ORAL
  Filled 2022-05-13 (×2): qty 2

## 2022-05-13 MED ORDER — CHLORHEXIDINE GLUCONATE CLOTH 2 % EX PADS
6.0000 | MEDICATED_PAD | Freq: Every day | CUTANEOUS | Status: DC
  Administered 2022-05-13: 6 via TOPICAL

## 2022-05-13 MED ORDER — METHOCARBAMOL 500 MG PO TABS
500.0000 mg | ORAL_TABLET | Freq: Four times a day (QID) | ORAL | Status: DC | PRN
  Administered 2022-05-13 – 2022-05-24 (×9): 500 mg via ORAL
  Filled 2022-05-13 (×10): qty 1

## 2022-05-13 MED ORDER — HYDROXYZINE HCL 10 MG PO TABS
10.0000 mg | ORAL_TABLET | Freq: Three times a day (TID) | ORAL | Status: DC | PRN
  Administered 2022-05-16 – 2022-05-17 (×3): 10 mg via ORAL
  Filled 2022-05-13 (×4): qty 1

## 2022-05-13 MED ORDER — MAGNESIUM HYDROXIDE 400 MG/5ML PO SUSP: 30.0000 mL | Freq: Every day | ORAL | Status: DC | PRN

## 2022-05-13 MED ORDER — MIDODRINE HCL 5 MG PO TABS
10.0000 mg | ORAL_TABLET | Freq: Three times a day (TID) | ORAL | Status: DC
  Administered 2022-05-13 – 2022-05-24 (×33): 10 mg via ORAL
  Filled 2022-05-13 (×33): qty 2

## 2022-05-13 MED ORDER — PROCHLORPERAZINE EDISYLATE 10 MG/2ML IJ SOLN
5.0000 mg | Freq: Four times a day (QID) | INTRAMUSCULAR | Status: DC | PRN
  Administered 2022-05-13: 10 mg via INTRAMUSCULAR
  Filled 2022-05-13 (×2): qty 2

## 2022-05-13 MED ORDER — BENZONATATE 100 MG PO CAPS: 100.0000 mg | ORAL_CAPSULE | Freq: Three times a day (TID) | ORAL | Status: DC | PRN

## 2022-05-13 MED ORDER — BISACODYL 5 MG PO TBEC
5.0000 mg | DELAYED_RELEASE_TABLET | Freq: Every day | ORAL | Status: DC | PRN
  Administered 2022-05-17: 5 mg via ORAL
  Filled 2022-05-13: qty 1

## 2022-05-13 MED ORDER — HYDROMORPHONE HCL 2 MG PO TABS
8.0000 mg | ORAL_TABLET | ORAL | Status: DC | PRN
  Administered 2022-05-14 – 2022-05-17 (×7): 8 mg via ORAL
  Filled 2022-05-13 (×8): qty 4

## 2022-05-13 MED ORDER — HYDROMORPHONE HCL 4 MG PO TABS: 4.0000 mg | ORAL_TABLET | ORAL | Status: DC | PRN

## 2022-05-13 MED ORDER — GUAIFENESIN-DM 100-10 MG/5ML PO SYRP: 5.0000 mL | ORAL_SOLUTION | Freq: Four times a day (QID) | ORAL | Status: DC | PRN

## 2022-05-13 MED ORDER — ALUM & MAG HYDROXIDE-SIMETH 200-200-20 MG/5ML PO SUSP: 30.0000 mL | ORAL | Status: DC | PRN

## 2022-05-13 NOTE — Progress Notes (Signed)
Patient ID: [name], male   DOB: 05/30/1956, 66 y.o.   MRN: [id number] INPATIENT REHABILITATION ADMISSION NOTE   Arrival Method: bed      Mental Orientation: A&Ox4   Assessment: completed per flowsheet    Skin: intact    IV'S: R chest port    Pain: 7/10 managed with dilaudid   Tubes and Drains: none   Safety Measures: 4 side rails per patient request    Vital Signs: completed per flowsheet    Height and Weight: 5'9" 130 lbs    Rehab Orientation: completed    Family: at bedside    Notes:

## 2022-05-13 NOTE — Progress Notes (Signed)
Inpatient Rehabilitation Admission Medication Review by a Pharmacist  A complete drug regimen review was completed for this patient to identify any potential clinically significant medication issues.  High Risk Drug Classes Is patient taking? Indication by Medication  Antipsychotic Yes Compazine - nausea/vomiting  Anticoagulant Yes Xarelto - atrial fibrillation  Antibiotic No   Opioid Yes Dilaudid PRN - severe pain MS Contin - pain  Antiplatelet No   Hypoglycemics/insulin No   Vasoactive Medication Yes Midodrine - hypotension  Chemotherapy No   Other Yes Tessalon - cough Robitussin DM - cough Bisacodyl - constipation Fleet Enema - constipation Tylenol - pain     Type of Medication Issue Identified Description of Issue Recommendation(s)  Drug Interaction(s) (clinically significant)     Duplicate Therapy     Allergy     No Medication Administration End Date     Incorrect Dose     Additional Drug Therapy Needed     Significant med changes from prior encounter (inform family/care partners about these prior to discharge).    Other  PTA meds held during hospital admission: Metoprolol, Ativan, Cymbalta, Cemilipimab, Alirocumab Restart PTA meds when and if necessary during CIR admission or at time of discharge, if warranted    Clinically significant medication issues were identified that warrant physician communication and completion of prescribed/recommended actions by midnight of the next day:  No  Name of provider notified for urgent issues identified:   Provider Method of Notification:     Pharmacist comments:   Time spent performing this drug regimen review (minutes):  [place], PharmD, BCPS 05/13/2022 12:42 PM

## 2022-05-13 NOTE — H&P (Addendum)
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  He presented to the emergency department at [facility] on 05/03/2022 complaining of generalized weakness and multiple falls for approximately 3 weeks.  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  He is s/p whole brain radiation currently on experimental chemotherapy. He is enrolled in clinical trial at [facility] and the experimental drug alirocumab is typically held during hospital admission according to trial study coordinator.  Neurology was consulted and he underwent MRI of cervical and thoracic spine this admission.  He is currently being aggressively treated for significant orthostatic hypotension and cardiology has been consulted.  Multiple medications have been held and he is on midodrine 3 times daily.  This has improved however he he still tachycardic to 140s when ambulating. His past medical history significant for coronary artery disease status post emergent CABG in 2017.  He also has a history of paroxysmal atrial fibrillation maintained on Xarelto.  Current ejection fraction is estimated at 55 to 60%. The patient requires inpatient physical medicine and rehabilitation evaluations and treatment secondary to dysfunction due to metastatic large cell neuroendocrine carcinoma.   Past medical history includes bilateral carotid artery stenosis, ischemic cardiomyopathy, hypertension, gout all currently stable.   ROS  - CONSTITUTIONAL: +weight loss, no fever, and chills. +fatigue. +mental fog  - HEENT: Denies changes in vision and hearing.  - RESPIRATORY: Denies SOB and cough.  - CV: Denies palpitations and CP.  - GI: Denies abdominal pain, + nausea, no vomiting and diarrhea. + poor appetite  - GU:  Denies dysuria and urinary frequency.  - MSK: + joint pain + nighttime pain. no myalgias.   - SKIN: Denies rash and pruritus.  - NEUROLOGICAL: Denies headache. + presyncope.  - PSYCHIATRIC: +anxiety and depression      Past Medical History:  Diagnosis Date   Anemia     Anxiety     Arthritis     Basal cell carcinoma (BCC) of forehead     CAD (coronary artery disease)      a. 10/2015 ant STEMI >> LHC with 3 v CAD; oLAD tx with POBA >> emergent CABG. b. Multiple evals since that time, early graft failure of SVG-RCA by cath 03/2016. c. 2/19 PCI/DES x1 to pRCA, normal EF.   Carotid artery disease ([place])      a. 40-59% BICA 02/2018.   Depression     Dyspnea     Ectopic atrial tachycardia (HCC)     Esophageal reflux      eosinophil esophagitis   Family history of adverse reaction to anesthesia      "sister has PONV" (06/21/2017)   Former tobacco use     Gout     Hepatitis C      "treated and cured" (06/21/2017)   High cholesterol     History of blood transfusion     History of kidney stones     Hypertension     Ischemic cardiomyopathy      a. EF 25-30% at intraop TEE 4/17  //  b. Limited Echo 5/17 - EF 45-50%, mild ant HK. c. EF 55-65% by cath 09/2017.   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[place])     Migraine      "3-4/yr" (06/21/2017)  Myocardial infarction ([place]) 10/2015   Palpitations     Pneumonia     Sinus bradycardia      a. HR dropping into 40s in 02/2016 -> BB reduced.   Stroke [facility]) 10/2016    "small one; sometimes my memory/cognitive issues" (06/21/2017)   Symptomatic hypotension      a. 02/2016 ER visit -> meds reduced.   Syncope     Wears dentures     Wears glasses           Past Surgical History:  Procedure Laterality Date   25 GAUGE PARS PLANA VITRECTOMY WITH 20 GAUGE MVR PORT   12/31/2020    Procedure: 25 GAUGE PARS PLANA VITRECTOMY WITH 20 GAUGE MVR PORT;  Surgeon: [name], MD;  Location: [place];  Service: Thoracic;;   ANTERIOR CERVICAL  DECOMP/DISCECTOMY FUSION N/A 10/17/2018    Procedure: Anterior Cervical Decompression Fusion - Cervical seven -Thoracic one;  Surgeon: Consuella Lose, MD;  Location: [place];  Service: Neurosurgery;  Laterality: N/A;   BASAL CELL CARCINOMA EXCISION        "forehead   BIOPSY   07/20/2019    Procedure: BIOPSY;  Surgeon: Carol Ada, MD;  [facility]: [facility];  Service: Endoscopy;;   BIOPSY OF MEDIASTINAL MASS Right 07/29/2021    Procedure: RIGHT CHEST WALL MASS BIOPSY;  Surgeon: [name], MD;  Location: [place];  Service: Thoracic;  Laterality: Right;   BRONCHIAL BIOPSY   12/24/2020    Procedure: BRONCHIAL BIOPSIES;  Surgeon: Garner Nash, DO;  Location: [place] [facility];  Service: Pulmonary;;   BRONCHIAL BRUSHINGS   12/24/2020    Procedure: BRONCHIAL BRUSHINGS;  Surgeon: Garner Nash, DO;  Location: [place];  Service: Pulmonary;;   BRONCHIAL NEEDLE ASPIRATION BIOPSY   12/24/2020    Procedure: BRONCHIAL NEEDLE ASPIRATION BIOPSIES;  Surgeon: Garner Nash, DO;  Location: [place];  Service: Pulmonary;;   CARDIAC CATHETERIZATION N/A 11/28/2015    Procedure: Left Heart Cath and Coronary Angiography;  Surgeon: [name], MD;  Location: [place] CV [facility];  Service: Cardiovascular;  Laterality: N/A;   CARDIAC CATHETERIZATION N/A 11/28/2015    Procedure: Coronary Balloon Angioplasty;  Surgeon: [name], MD;  Location: [place] [facility];  Service: Cardiovascular;  Laterality: N/A;  ostial LAD   CARDIAC CATHETERIZATION N/A 11/28/2015    Procedure: Coronary/Graft Angiography;  Surgeon: [name], MD;  Location: [place] [facility];  Service: Cardiovascular;  Laterality: N/A;  coronaries only     CARDIAC CATHETERIZATION N/A 04/21/2016    Procedure: Left Heart Cath and Coronary Angiography;  Surgeon: Wellington Hampshire, MD;  Location: [place] [facility];  Service: Cardiovascular;  Laterality: N/A;   CARDIAC CATHETERIZATION N/A 06/14/2016    Procedure:  Left Heart Cath and Cors/Grafts Angiography;  Surgeon: Lorretta Harp, MD;  Location: [place] CV [facility];  Service: Cardiovascular;  Laterality: N/A;   CARDIAC CATHETERIZATION N/A 09/08/2016    Procedure: Left Heart Cath and Cors/Grafts Angiography;  Surgeon: Wellington Hampshire, MD;  Location: [place] [facility];  Service: Cardiovascular;  Laterality: N/A;   CARDIAC CATHETERIZATION       CORONARY ARTERY BYPASS GRAFT N/A 11/28/2015    Procedure: CORONARY ARTERY BYPASS GRAFTING (CABG) TIMES FIVE USING LEFT INTERNAL MAMMARY ARTERY AND RIGHT GREATER SAPHENOUS,VIEN HARVEATED BY ENDOVIEN, INTRAOPPRATIVE TEE;  Surgeon: Gaye Pollack, MD;  Location: [place];  Service: Open Heart Surgery;  Laterality: N/A;   CORONARY STENT INTERVENTION N/A 10/05/2017  Procedure: CORONARY STENT INTERVENTION;  Surgeon: [name], MD;  Location: [place] CV [facility];  Service: Cardiovascular;  Laterality: N/A;   ESOPHAGOGASTRODUODENOSCOPY (EGD) WITH PROPOFOL N/A 07/20/2019    Procedure: ESOPHAGOGASTRODUODENOSCOPY (EGD) WITH PROPOFOL;  Surgeon: Carol Ada, MD;  [facility];  Service: Endoscopy;  Laterality: N/A;   ESOPHAGOGASTRODUODENOSCOPY (EGD) WITH PROPOFOL N/A 01/30/2021    Procedure: ESOPHAGOGASTRODUODENOSCOPY (EGD) WITH PROPOFOL;  Surgeon: Carol Ada, MD;  [facility];  Service: Endoscopy;  Laterality: N/A;   FIDUCIAL MARKER PLACEMENT   12/24/2020    Procedure: FIDUCIAL DYE MARKER PLACEMENT;  Surgeon: Garner Nash, DO;  Location: [place] [facility];  Service: Pulmonary;;   HUMERUS SURGERY Right 1969    "tumor inside bone; filled it w/bone chips"   INTERCOSTAL NERVE BLOCK Right 12/24/2020    Procedure: INTERCOSTAL NERVE BLOCK;  Surgeon: [name], MD;  Location: [place];  Service: Thoracic;  Laterality: Right;   IR IMAGING GUIDED PORT INSERTION   02/06/2021   LEFT HEART CATH AND CORS/GRAFTS ANGIOGRAPHY N/A 03/11/2017    Procedure: Left Heart Cath and Cors/Grafts Angiography;  Surgeon:  Leonie Man, MD;  Location: [place] [facility];  Service: Cardiovascular;  Laterality: N/A;   LEFT HEART CATH AND CORS/GRAFTS ANGIOGRAPHY N/A 10/05/2017    Procedure: LEFT HEART CATH AND CORS/GRAFTS ANGIOGRAPHY;  Surgeon: [name], MD;  Location: [place] [facility];  Service: Cardiovascular;  Laterality: N/A;   LEFT HEART CATH AND CORS/GRAFTS ANGIOGRAPHY N/A 04/11/2019    Procedure: LEFT HEART CATH AND CORS/GRAFTS ANGIOGRAPHY;  Surgeon: [name], MD;  Location: [place] [facility];  Service: Cardiovascular;  Laterality: N/A;   NODE DISSECTION Right 12/24/2020    Procedure: NODE DISSECTION;  Surgeon: [name], MD;  Location: [place];  Service: Thoracic;  Laterality: Right;   PERIPHERAL VASCULAR CATHETERIZATION N/A 06/14/2016    Procedure: Lower Extremity Angiography;  Surgeon: Lorretta Harp, MD;  Location: [place] CV [facility];  Service: Cardiovascular;  Laterality: N/A;   VIDEO BRONCHOSCOPY WITH ENDOBRONCHIAL NAVIGATION Right 12/24/2020    Procedure: VIDEO BRONCHOSCOPY WITH ENDOBRONCHIAL NAVIGATION;  Surgeon: Garner Nash, DO;  Location: [place];  Service: Pulmonary;  Laterality: Right;   VIDEO BRONCHOSCOPY WITH ENDOBRONCHIAL ULTRASOUND N/A 12/24/2020    Procedure: VIDEO BRONCHOSCOPY WITH ENDOBRONCHIAL ULTRASOUND;  Surgeon: Garner Nash, DO;  Location: [place];  Service: Pulmonary;  Laterality: N/A;   VIDEO BRONCHOSCOPY WITH INSERTION OF INTERBRONCHIAL VALVE (IBV) N/A 12/31/2020    Procedure: VIDEO BRONCHOSCOPY WITH INSERTION OF INTERBRONCHIAL VALVE (IBV).VALVE IN CARTRIDGE 81mm,9mm. CHEST TUBE PLACEMENT.;  Surgeon: [name], MD;  [facility];  Service: Thoracic;  Laterality: N/A;   VIDEO BRONCHOSCOPY WITH INSERTION OF INTERBRONCHIAL VALVE (IBV) N/A 07/29/2021    Procedure: VIDEO BRONCHOSCOPY WITH REMOVAL OF INTERBRONCHIAL VALVE (IBV);  Surgeon: [name], MD;  [facility];  Service: Thoracic;  Laterality: N/A;          Family History  Problem Relation Age of Onset   Lung cancer Mother     Heart Problems Father     Heart attack Father 69   Stroke Father     Heart failure Father     Heart attack Maternal Grandmother     Stroke Maternal Grandmother     Heart attack Paternal Uncle     Hypertension Brother     Autoimmune disease Neg Hx      Social History:  reports that he quit smoking about 6 years ago.  His smoking use included cigarettes. He has a 33.00 pack-year smoking history. He has never used smokeless tobacco. He reports that he does not currently use alcohol. He reports that he does not use drugs. Allergies:       Allergies  Allergen Reactions   Prednisone Other (See Comments)      States that this med makes him "crazy"   Tetanus Toxoids Swelling and Other (See Comments)      Fever, Swelling of the arm    Wellbutrin [Bupropion] Other (See Comments)      Crazy thoughts, nightmares   Varenicline Other (See Comments)      Unpleasant dreams          Medications Prior to Admission  Medication Sig Dispense Refill   acetaminophen (TYLENOL) 500 MG tablet Take 1,000 mg by mouth every 6 (six) hours as needed for mild pain, fever or headache.       benzonatate (TESSALON) 100 MG capsule Take 1 capsule (100 mg total) by mouth 3 (three) times daily as needed for cough. TAKE 1 CAPSULE(100 MG) BY MOUTH THREE TIMES DAILY AS NEEDED FOR COUGH Strength: 100 mg 30 capsule 2   bisacodyl (DULCOLAX) 5 MG EC tablet Take 5 mg by mouth daily as needed (constipation).       docusate sodium (STOOL SOFTENER) 100 MG capsule Take 100 mg by mouth 2 (two) times daily as needed (constipation).       DULoxetine (CYMBALTA) 30 MG capsule Take 1 capsule by mouth 2 times daily. 60 capsule 1   HYDROmorphone (DILAUDID) 8 MG tablet Take 1 tablet by mouth every 6 hours as needed for severe pain. 60 tablet 0   lidocaine-prilocaine (EMLA) cream Apply to the Port-A-Cath site 30-60-minute before treatment (Patient taking differently:  Apply 1 Application topically once as needed (prior to port access - 30-60 minutes before treatment).) 30 g 0   LORazepam (ATIVAN) 1 MG tablet Take 1 tablet (1 mg total) by mouth every 8 (eight) hours as needed for anxiety. 60 tablet 0   metoprolol succinate (TOPROL-XL) 25 MG 24 hr tablet Take 0.5 tablets (12.5 mg total) by mouth daily. (Patient taking differently: Take 12.5 mg by mouth daily as needed (high blood pressure). Take 1/2 tablet (12.5 mg) if BP>120.) 45 tablet 3   morphine (MS CONTIN) 60 MG 12 hr tablet Take 1 tablet by mouth every 12  hours. 60 tablet 0   ondansetron (ZOFRAN) 8 MG tablet Take 1 tablet by mouth every 8 hours as needed for nausea or vomiting. Starting 3 days after chemotherapy 90 tablet 2   prochlorperazine (COMPAZINE) 10 MG tablet Take 10 mg by mouth every 6 (six) hours as needed for nausea or vomiting.       XARELTO 20 MG TABS tablet Take 1 tablet (20 mg total) by mouth every evening. 90 tablet 1   dexamethasone (DECADRON) 4 MG tablet Take 1 tablet (4 mg total) by mouth daily. (Patient not taking: Reported on 05/04/2022) 40 tablet 0   tiZANidine (ZANAFLEX) 2 MG tablet Take 1 tablet (2 mg total) by mouth at bedtime as needed for muscle spasms. (Patient not taking: Reported on 05/04/2022) 30 tablet 2          Home: [place] expects to be discharged to:: Private residence Living Arrangements: Spouse/significant other Available Help at Discharge: Family, Available 24 hours/day Type of Home: House Home Access: Stairs to enter [employer] of Steps: 6 Entrance Stairs-Rails: Right, Left Home Layout: Two level, Bed/bath  upstairs Alternate Level Stairs-Number of Steps: 13 Alternate Level Stairs-Rails: Can reach both Bathroom Shower/Tub: Tub/shower unit Home Equipment: Cane - quad, Rollator (4 wheels) Additional Comments: daughter staying through mid september to assist, [place] of state. Been sleeping downstairs on couch. 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   Functional History: Prior Function Prior Level of Function : Needs assist Mobility Comments: multiple falls at home. Been using RW ADLs Comments: takes baths- sits down in tub. no grab bars. Just started [facility] aide 3-4/week assist with bathing, prepping meals just began last week.   Functional Status:  Mobility: Bed Mobility Overal bed mobility: Modified Independent Bed Mobility: Supine to Sit Supine to sit: Modified independent (Device/Increase time), HOB elevated General bed mobility comments: upon sitting EOB, had pt perform UE movements such as reaching, shoulder abduction and shoulder flexion for approx 2 minutes prior to standing Transfers Overall transfer level: Needs assistance Equipment used: Rolling walker (2 wheels) Transfers: Sit to/from Stand Sit to Stand: Min assist Bed to/from chair/wheelchair/BSC transfer type:: Step pivot Step pivot transfers: Min assist General transfer comment: assist to rise and steady, pt marched in place approx 30 seconds and then returned to sitting due to fatigue; pt performed again min assist to rise and steady, pt able to control descent Ambulation/Gait Ambulation/Gait assistance: Min assist, Min guard Gait Distance (Feet): 120 Feet Assistive device: Rolling walker (2 wheels) Gait Pattern/deviations: Decreased stride length, Step-through pattern, Trunk flexed General Gait Details: assist for stability, pt reported mild dizziness however states different type then symptoms from orthostasis; pt c/o chest "tightness" after ambulating however no pain, SpO2 98%; HR was elevated to 148 bpm during ambulation however 105 bpm upon sitting in recliner; recliner following during ambulation for safety Gait velocity: decreased   ADL: ADL Overall ADL's : Needs assistance/impaired Eating/Feeding: Set up, Sitting Eating/Feeding Details (indicate cue type and reason): patient trialed weighted cuffs on LUE with  reportes of decreased tremor like movement. patient was educated on weighted gloves with noted tremors in digits and patient reporting desire to be able to text and write as long as possible. patient was shown different options via computer. Grooming: Set up, Sitting Grooming Details (indicate cue type and reason): in recliner Upper Body Bathing: Minimal assistance, Sitting Lower Body Bathing: Minimal assistance, Sitting/lateral leans Upper Body Dressing : Set up Lower Body Dressing: Min guard, Sitting/lateral leans Lower Body Dressing Details (indicate cue type and reason): patient was able to don bilateral shoes with min guard sititng EOB Toilet Transfer: Min guard, Stand-pivot, Rolling walker (2 wheels) Toilet Transfer Details (indicate cue type and reason): simulated with recliner transfer Toileting- Clothing Manipulation and Hygiene: Minimal assistance Tub/ Banker: Tub transfer, Moderate assistance Tub/Shower Transfer Details (indicate cue type and reason): gets down in the tub because he gets too cold out of the water Functional mobility during ADLs: Min guard, Rolling walker (2 wheels) General ADL Comments: patient was noted to have HR increase to 148 bpm with standing at EOB. patient completed tranfer with min guard to recliner in room with increased time and cues for proper sequencing. patient's HR was noted to reduce to 98 bpm with rest in chair. patient reported feelign SOB while standing but dissapated when seated. nurse made aware.   Cognition: Cognition Overall Cognitive Status: Within Functional Limits for tasks assessed Orientation Level: Oriented X4 Cognition Arousal/Alertness: Awake/alert Behavior During Therapy: WFL for tasks assessed/performed Overall Cognitive Status: Within Functional Limits for tasks assessed General Comments: noted to  have some memory issues with patient calling wife to ask where phone is during session after giving wife phone just 10 mins  prior to complete task for him while in therapy. patient requested to call to verify even with education that patients wife had it   Physical Exam: Blood pressure 107/67, pulse 77, temperature 98.7 F (37.1 C), temperature source Oral, resp. rate 18, height 5\' 9"  (1.753 m), weight 62.6 kg, SpO2 99 %. Physical Exam   Lab Results Last 48 Hours  No results found. However, due to the size of the patient record, not all encounters were searched. Please check Results Review for a complete set of results.   Imaging Results (Last 48 hours)  No results found.    Constitutional: No apparent distress. Appropriate appearance for age.  HENT: No JVD. Neck Supple. Trachea midline. Atraumatic. +temporal wasting Eyes: PERRLA. EOMI. Visual fields grossly intact.  Cardiovascular: RRR, no murmurs/rub/gallops. No Edema. Peripheral pulses 2+  Respiratory: CTAB. No rales, rhonchi, or wheezing. On RA.  Abdomen: + bowel sounds, normoactive. No distention or tenderness. + abdominal binder GU: Not examined.  Skin: C/D/I. No apparent lesions. MSK:      No apparent deformity.      Strength:                RUE: 4/5 SA, 4/5 EF, 4/5 EE, 5/5 WE, 5/5 FF, 5/5 FA                 LUE: 5/5 SA, 5/5 EF, 5/5 EE, 5/5 WE, 5/5 FF, 5/5 FA                 RLE: 4/5 HF, 4/5 KE, 5/5 DF, 5/5 EHL, 5/5 PF                 LLE:  5/5 HF, 5/5 KE, 5/5 DF, 5/5 EHL, 5/5 PF   Neurologic exam:  Cognition: AAO to person, place, time and event. Cannot ad change, can perform serial 7s. Cannot spell world backwards. Language: Fluent, No substitutions or neoglisms. No dysarthria. Names 3/3 objects correctly.  Memory: Recalls 1/3 objects at 5 minutes.   Insight: Good insight into current condition.  Mood: Appropriate affect, depressed mood, tearful Sensation: To light touch intact in BL UEs and LEs with exception of right foot plantar, left 5th digit Reflexes: 2+ in BL UE and LEs. Negative Hoffman's and babinski signs bilaterally.  CN: 2-12  grossly intact.  Coordination: + BL UE intention tremors. No ataxia on FTN, HTS bilaterally.  Spasticity: MAS 0 in all extremities.     Blood pressure 107/67, pulse 77, temperature 98.7 F (37.1 C), temperature source Oral, resp. rate 18, height 5\' 9"  (1.753 m), weight 62.6 kg, SpO2 99 %.   Medical Problem List and Plan: 1. 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  Antithrombotics: -DVT/anticoagulation:  Pharmaceutical: Xarelto             -antiplatelet therapy: none  3. 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 Tylenol, Robaxin             -  MS Contin 60 mg BID - home dose             - Dilaudid 4 mg q 4 hours as needed -> increased to home dose 4/8 mg Q4H PRN; monitor for worsening BP             - Pain meds managed by [facility] pain management; will need to inform them of any changes at discharge  4. Mood/Behavior/Sleep: LCSW to evaluate and provide emotional support             -antipsychotic agents: n/a             -depression: Cymbalta held due to hypotension; once improved, patient may benefit from change to Mirtazepine for depression, sleep, and appetite. Do appreciate symptoms of anxiety may be more impactful. Will monitor for now.              -anxiety: hydroxyzine 10 mg PRN; Ativan held due to hypotension             - Added Trazadone 50 mg QHS PRN for insomnia             - Neuropsych consult placed given cognitive deficits, anxiety, and depression s/p rad/chemotherapy for brain mets - on schedule for Monday  5. Neuropsych/cognition: This patient is capable of making decisions on his own behalf.           - SLP consult placed   6. Skin/Wound Care: routine skin care checks  7.  Fluids/Electrolytes/Malnutrition (endorses 70+ lb unintentional weight loss in <1 year): routine Is and Os and follow-up chemistries         - Prealbumin added to admission labs  8: Large cell neuroendocrine carcinoma with brain, bone metastasis:             -clinical trial med held during admission  9: Orthostatic hypotension: continue midodrine 10 mg TID             - abdominal binder and TED only during the daytime - changed to when up to bedside or OOB due to discomfort.             - +/- Mestinon per cardiology; orthostatics improving, hold off for now             - no beta blockers 10: CAD s/p CABG 2017: Stable.  Currently off beta-blocker  11: Paroxysmal atrial fibrillation: continue Xarelto             -no BB due to significant orthostatic hypotension  12: Anemia, chronic: follow-up CBC  13: Code status: DNR/DNI - following with palliative     Barbie Banner, PA-C 05/13/2022   I have examined the patient independently and edited the note for HPI, ROS, exam, assessment, and plan as appropriate. I am in agreement with the above recommendations.   Gertie Gowda, DO 05/13/2022

## 2022-05-13 NOTE — Progress Notes (Signed)
Inpatient Rehab Admissions Coordinator:   I do have a bed available for this patient to admit today.  Dr. Karleen Hampshire in agreement.  I will call and arrange [facility] transport for pickup and update with timing/room number/etc once available.  TOC aware.  I will call wife to update shortly.   Shann Medal, PT, DPT Admissions Coordinator [phone] 05/13/22  9:57 AM

## 2022-05-13 NOTE — Discharge Summary (Addendum)
Physician Discharge Summary   Patient: [name] MRN: [id number] DOB: May 02, 1956  Admit date:     05/03/2022  Discharge date: 05/13/22  Discharge Physician: Hosie Poisson   PCP: Anthony Morales, MD   Recommendations at discharge:  Please follow up with PCp in 2 weeks.  Please follow up with Oncology as  scheduled.   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 Presenting with falls.    Patient underwent CT Head, Chest, abdomen and pelvis along with cervical spine and results reviewed with the patient.  Neurology consulted, recommended getting EEG, MRI Cervical and thoracic spine.  Discussed the results of the MRI with the patient and family. EEG does not show any active seizures. Neurology signed off.  Patient was found to be in severe orthostatic hypotension. He was started on compression stockings up to thigh, abdominal binder, stopped the ativan and Cymbalta and started him on Midodrine with minimal improvement in the orthostatic bp measurements.  Cardiology consulted for severe orthostatic hypotension and recommendations given.  Meanwhile therapy eval recommending CIR.    Assessment and Plan:    Multiple falls in the last few weeks associated with tremors of the upper extremities and spells of unresponsiveness -Neurology consulted and recommendations given. EEG shows mild diffuse encephalopathy,  No  seizures or epileptiform discharges were seen throughout the recording. Neurology recommending MRI of the C-spine and thoracic spine for further evaluation.  Discussed the results of the MRI of the C spine and thoracic spine with the patient and daughter on the phone.   - daughter reports his symptoms of unresponsiveness and multiple falls are mostly when gets up . He was found to have significant orthostatic hypotension which would explain his dizzy spells. .      Orthostatic hypotension -Persistent hypotension on standing, with dizziness.  -Gentle hydration, compression stockings up to the thigh, abdominal binder . - midodrine added, discontinued the ativan and Cymbalta for now. Increased the dose of midodrine to 10 mg TID due to persistent orthostatic hypotension.  - suspect probably from autonomic dysfunction from chemotherapy? - appreciate cardiology input, recommend starting him on mestinon if he remains orthostatic.  - carotid duplex less than a year is wnl.      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 Recommend outpatient follow-up with oncology at [facility]. Patient enrolled in clinical trial at [facility], The Alirocumab dose was due today, Pharmacy contacted his trial study coordinator this morning who said standard practice is to hold it while admitted.  Discussed the results of the MRI with the daughter and patient, unable to open films from February.      PAF  Rate controlled.  On Xarelto for anti coagulation.      CAD s/p PCI, Currently chest pain free.        Anemia of chronic disease:  Transfuse to keep hemoglobin greater than 7.  Currently hemoglobin stable at  10.        Hyponatremia:  Resolved.    Hypokalemia:  Replaced.  Recheck today.     Some nausea and an episode of dry heaving earlier this am.  Abd x rays ordered , showed Small nonobstructive right renal calculus. No abnormal  bowel dilatation is noted.   Patient reports he is feeling better lately when compared to the last 1 month.  He is agreeable to stopping the IV pain meds and work with oral pain meds.  Plan for cir when bed is available.     Consultants: palliative care Cardiology Neurology  Procedures performed: MRI c and thoracic spine.   Disposition: Rehabilitation facility Diet recommendation:  Discharge Diet Orders (From admission, onward)     Start     Ordered   05/13/22 0000  Diet - low sodium heart healthy        05/13/22 1108           Regular diet DISCHARGE MEDICATION: Allergies as of 05/13/2022       Reactions   Prednisone Other (See Comments)   States that this med makes him "crazy"   Tetanus Toxoids Swelling, Other (See Comments)   Fever, Swelling of the arm    Wellbutrin [bupropion] Other (See Comments)   Crazy thoughts, nightmares   Varenicline Other (See Comments)   Unpleasant dreams        Medication List     STOP taking these medications    dexamethasone 4 MG tablet Commonly known as: DECADRON   DULoxetine 30 MG capsule Commonly known as: CYMBALTA   LORazepam 1 MG tablet Commonly known as: ATIVAN   metoprolol succinate 25 MG 24 hr tablet Commonly known as: TOPROL-XL       TAKE these medications    acetaminophen 500 MG tablet Commonly known as: TYLENOL Take 1,000 mg by mouth every 6 (six) hours as needed for mild pain, fever or headache.   benzonatate 100 MG capsule Commonly known as: TESSALON Take 1 capsule (100 mg total) by mouth 3 (three) times daily as needed for cough. TAKE 1 CAPSULE(100 MG) BY MOUTH THREE TIMES DAILY AS NEEDED FOR COUGH Strength: 100 mg   bisacodyl 5 MG EC tablet Commonly known as: DULCOLAX Take 5 mg by mouth daily as needed (constipation).   HYDROmorphone 8 MG tablet Commonly known as: DILAUDID Take 1 tablet by mouth every 6 hours as needed for severe pain.   hydrOXYzine 10 MG tablet Commonly known as:  ATARAX Take 1 tablet (10 mg total) by mouth 3 (three) times daily as needed for anxiety.   lidocaine-prilocaine cream Commonly known as: EMLA Apply to the Port-A-Cath site 30-60-minute before treatment What changed:  how much to take how to take this when to take this reasons to take this additional instructions   midodrine 10 MG tablet Commonly known as: PROAMATINE Take 1 tablet (10 mg total) by mouth 3 (three) times daily with meals.   morphine 60 MG 12 hr tablet Commonly known as: MS CONTIN Take 1 tablet by mouth every 12  hours.   ondansetron 8 MG tablet Commonly known as: ZOFRAN Take 1 tablet by mouth every 8 hours as needed for nausea or vomiting. Starting 3 days after chemotherapy   prochlorperazine 10 MG tablet Commonly known as: COMPAZINE Take 10 mg by mouth every 6 (six) hours as needed for nausea or vomiting.   Stool Softener 100 MG capsule Generic drug: docusate sodium Take 100 mg by mouth 2 (two) times daily  as needed (constipation).   tiZANidine 2 MG tablet Commonly known as: ZANAFLEX Take 1 tablet (2 mg total) by mouth at bedtime as needed for muscle spasms.   Xarelto 20 MG Tabs tablet Generic drug: rivaroxaban Take 1 tablet (20 mg total) by mouth every evening.        Discharge Exam: Filed Weights   05/03/22 2125  Weight: 62.6 kg   General exam: Appears calm and comfortable  Respiratory system: Clear to auscultation. Respiratory effort normal. Cardiovascular system: S1 & S2 heard, RRR. No JVD No pedal edema. Gastrointestinal system: Abdomen is nondistended, soft and nontender.Normal bowel sounds heard. Central nervous system: Alert and oriented. No focal neurological deficits. Extremities: Symmetric 5 x 5 power. Skin: No rashes, lesions or ulcers Psychiatry: Mood & affect appropriate.     Condition at discharge: fair  The results of significant diagnostics from this hospitalization (including imaging, microbiology, ancillary and  laboratory) are listed below for reference.   Imaging Studies: DG Abd 2 Views  Result Date: 05/10/2022 CLINICAL DATA:  Nausea, vomiting. EXAM: ABDOMEN - 2 VIEW COMPARISON:  February 10, 2021. FINDINGS: The bowel gas pattern is normal. There is no evidence of free air. Small nonobstructive right renal calculus is noted. IMPRESSION: Small nonobstructive right renal calculus. No abnormal bowel dilatation is noted. Electronically Signed   By: Marijo Conception M.D.   On: 05/10/2022 09:27   MR THORACIC SPINE W WO CONTRAST  Result Date: 05/07/2022 CLINICAL DATA:  Ataxia.  Nontraumatic.  Non-small cell lung cancer. EXAM: MRI THORACIC WITHOUT AND WITH CONTRAST TECHNIQUE: Multiplanar and multiecho pulse sequences of the thoracic spine were obtained without and with intravenous contrast. CONTRAST:  49mL GADAVIST GADOBUTROL 1 MMOL/ML IV SOLN COMPARISON:  Vertebral body heights are maintained. A 12 mm T2 hyperintense enhancing lesion is present in the inferior aspect of the L1 vertebral body. No other discrete osseous lesions present the spine. FINDINGS: Alignment: No significant listhesis is present. Thoracic kyphosis is within normal limits. Mild leftward curvature is centered at T9, stable. Vertebrae: A 12 mm peripherally enhancing lesion is present in the right inferior aspect of L1. Other enhancing lesions are present within the thoracic spine. Vertebral body heights are maintained. Cord:  Normal signal and morphology. Paraspinal and other soft tissues: Enhancing paraspinous soft tissue present right side of the spine T8-9 T12-L1. T11 posterior rib fracture is better seen on CT. A right pleural effusion enhances. Numerous pulmonary nodules are again noted bilaterally. The largest in the left lung measuring up to 19 mm. Disc levels: Shallow right paramedian disc protrusion is present without significant stenosis or change. Mild right foraminal narrowing is present at the same level. No other significant disc protrusions or  central canal stenosis is present. The foramina are patent bilaterally. IMPRESSION: 1. 12 mm peripherally enhancing lesion in the right inferior aspect of L1 is concerning for metastatic disease to the thoracic spine. 2. Enhancing paraspinous soft tissue right side of the spine at T8-9 T12-L1 likely represents metastatic disease to the paraspinous soft tissues. 3. Right T11 posterior rib fracture is better seen on CT. 4. Numerous pulmonary nodules bilaterally compatible with metastatic disease. 5. Shallow right paramedian disc protrusion at T9-10 without significant stenosis or change. Electronically Signed   By: San Morelle M.D.   On: 05/07/2022 15:37   MR CERVICAL SPINE W WO CONTRAST  Result Date: 05/07/2022 CLINICAL DATA:  Ataxia, cervical pathology EXAM: MRI CERVICAL SPINE WITHOUT AND WITH CONTRAST TECHNIQUE: Multiplanar and multiecho pulse sequences of the  cervical spine, to include the craniocervical junction and cervicothoracic junction, were obtained without and with intravenous contrast. CONTRAST:  19mL GADAVIST GADOBUTROL 1 MMOL/ML IV SOLN COMPARISON:  None Available. FINDINGS: Alignment: Minimal anterolisthesis of C3 on C4. Vertebrae: No acute fracture, evidence of discitis, or aggressive bone lesion. Cord: Normal signal and morphology. Posterior Fossa, vertebral arteries, paraspinal tissues: Posterior fossa demonstrates no focal abnormality. Vertebral artery flow voids are maintained. Paraspinal soft tissues are unremarkable. Disc levels: Discs: Anterior cervical fusion at C7-T1. Degenerative disease with mild disc height loss at C4-5 and C5-6. C2-3: Small central disc protrusion. Bilateral foraminal stenosis. No central canal stenosis. C3-4: Broad-based disc bulge with a small central disc protrusion. Moderate-severe right foraminal stenosis. Moderate left foraminal stenosis. Mild spinal stenosis. C4-5: Mild broad-based disc bulge. Moderate right facet arthropathy. Moderate-severe left  foraminal stenosis. Moderate right foraminal stenosis. No spinal stenosis. C5-6: Small right paracentral disc protrusion. No foraminal or central canal stenosis. C6-7: Mild broad-based disc bulge. No foraminal or central canal stenosis. C7-T1: Interbody fusion. No neural foraminal stenosis. No central canal stenosis. IMPRESSION: 1. At C3-4 there is a broad-based disc bulge with a small central disc protrusion. Moderate-severe right foraminal stenosis. Moderate left foraminal stenosis. Mild spinal stenosis. 2. At C4-5 there is a mild broad-based disc bulge. Moderate right facet arthropathy. Moderate-severe left foraminal stenosis. Moderate right foraminal stenosis. 3. Anterior cervical fusion at C7-T1 without foraminal or central canal stenosis. Electronically Signed   By: Kathreen Devoid M.D.   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  EEG to evaluate for seizure. Level of alertness: Awake AEDs during EEG study: None Technical aspects: This EEG study was done with scalp electrodes positioned according to the 10-20 International system of electrode placement. Electrical activity was reviewed with band pass filter of 1-70Hz , sensitivity of 7 uV/mm, display speed of 66mm/sec with a 60Hz  notched filter applied as appropriate. EEG data were recorded continuously and digitally stored.  Video  monitoring was available and reviewed as appropriate. Description: The posterior dominant rhythm consists of 9 Hz activity of moderate voltage (25-35 uV) seen predominantly in posterior head regions, symmetric and reactive to eye opening and eye closing. EEG showed intermittent generalized 3 to 6 Hz theta-delta slowing. Hyperventilation and photic stimulation were not performed.   ABNORMALITY - Intermittent slow, generalized IMPRESSION: This study is suggestive of mild diffuse encephalopathy, nonspecific etiology. No seizures or epileptiform discharges were seen throughout the recording. Lora Havens   CT Head Wo Contrast  Result Date: 05/04/2022 CLINICAL DATA:  Frequent falls. On chemotherapy. History of hepatitis-C. Neuroendocrine carcinoma. EXAM: CT HEAD WITHOUT CONTRAST CT CERVICAL SPINE WITHOUT CONTRAST CT CHEST, ABDOMEN AND PELVIS WITH CONTRAST TECHNIQUE: Contiguous axial images were obtained from the base of the skull through the vertex without intravenous contrast. Multidetector CT imaging of the cervical spine was performed without intravenous contrast. Multiplanar CT image reconstructions were also generated. Multidetector CT imaging of  the chest, abdomen and pelvis was performed following the standard protocol during bolus administration of intravenous contrast. RADIATION DOSE REDUCTION: This exam was performed according to the departmental dose-optimization program which includes automated exposure control, adjustment of the mA and/or kV according to patient size and/or use of iterative reconstruction technique. CONTRAST:  181mL OMNIPAQUE IOHEXOL 300 MG/ML  SOLN COMPARISON:  Head CT 04/20/2022, CTA neck and reconstructions 11/09/2016, abdomen and pelvis CT 01/14/2022, CTA chest 12/17/2021, and chest, abdomen and pelvis CT 03/14/2022. FINDINGS: CT HEAD FINDINGS Brain: There is known metastatic disease. Focal subcortical hypodensity is again noted in right superior frontal gyrus and left cerebellar  hemisphere, and is unchanged. There are chronic lacunar infarcts in the basal ganglia and thalami, mild cerebral atrophy and small vessel disease. Vascular: There are scattered calcifications in the carotid siphons but no hyperdense central vessels. Skull: No fracture or focal lesion is seen. Sinuses/Orbits: There are small retention cysts and slight membrane thickening again in the right maxillary sinus. Trace fluid in the right mastoid tip. Other sinuses and mastoid air cells are clear. Nasal septum deviates to the right. Other: None. CT CERVICAL FINDINGS Alignment: There is 2 mm grade 1 degenerative anterolisthesis C3-4 and a slight cervical dextroscoliosis. No new alignment abnormality. Narrowing and osteophytes again noted anterolateral tibial joint. Skull base and vertebrae: There is osteopenia without evidence of fractures or focal bone lesion. Interval anterior plate fusion W4-R1 with solid interbody fusion is noted since the prior study. Soft tissues and spinal canal: No prevertebral fluid or swelling. No visible canal hematoma. There are calcifications in both proximal cervical ICAs. Disc levels: There is mild disc space loss C4-5 and C5-6. The discs are otherwise normal in height. There are mild posterior disc osteophyte complexes at C3-4, C4-5 and C5-6 but no significant encroachment on the thecal sac. There are small anterior endplate spurs V4-0 through C6-7. Multilevel facet joint and uncinate spurring. Resulting degenerative foraminal stenosis is mild on the right at C2-3, severe on the right and moderate to severe on the left at C3-4, bilaterally moderate to severe C4-5. Other cervical foramina are patent. Other:  None. CT CHEST FINDINGS Cardiovascular: Right IJ port catheter terminates in the right atrium. The cardiac size is normal. There are coronary artery calcifications and CABG changes. There is no pericardial effusion. There is aortic and great vessel arthrosclerosis without aneurysm, stenosis  or dissection. The pulmonary arteries and veins are normal in caliber. Mediastinum/Nodes: There are enlarged right paratracheal lymph nodes, largest of which is in the low right paratracheal space measuring 2.8 x 2.5 cm, on the last study measuring 2.5 x 3.3 cm. More superiorly in the right paratracheal chain is stable heterogeneous lymph node measuring 1.3 cm in short axis on 2:10. Slightly prominent subcarinal nodes up to 1 cm in short axis are unchanged. No enlarged hilar nodes are seen with calcified right hilar lymph nodes again shown. There is no axillary or supraclavicular adenopathy. No thyroid mass is seen. The trachea is clear. Unremarkable thoracic esophagus. Lungs/Pleura: There are innumerable nodular pulmonary metastases. Largest in the left upper lobe is seen posteriorly and fissural based, measuring stable at 1.8 x 1.1 cm on 3:54. Another index nodule is noted in the left lobe measuring 1.1 cm on 3:116 and is also stable. Another index nodule on the right in the lower lobe is 1.1 cm on 3:63 posterior to the right hilum it appears very slightly larger today, previously measuring 9.3 mm. Numerous other nodules are not significantly changed. No  increased nodularity is suspected. Small loculated posterior basal right pleural effusion appears similar as well as adjacent streaky atelectasis or consolidation in the right lower lung field. There is no pneumothorax or left pleural fluid. The left main bronchus unremarkable. There is increased thickening and narrowing in the right main bronchus. Musculoskeletal: Irregularity and heterogeneity in the posterior right eleventh and twelfth ribs is again seen as well as a nondisplaced presumably pathologic fracture of the posterior right twelfth rib. Similar irregularity again noted in the posterior right ninth rib. There are degenerative changes of the thoracic spine osteopenia. CT ABDOMEN PELVIS FINDINGS Hepatobiliary: There are scattered tiny too small  characterize hypodensities in the liver substance, unchanged without mass enhancement. Gallbladder and bile ducts are unremarkable. Pancreas: No focal abnormality. Spleen: Unremarkable. Adrenals/Urinary Tract: There is no adrenal mass. Stable 2.1 cm left renal cyst not requiring follow-up. There is a 2 mm nonobstructive caliceal stone in the superior pole right kidney and a few subcentimeter too small to characterize hypodensities in the upper right kidney. The bladder is normal thickness considering the degree of distention. Stomach/Bowel: No dilatation or wall thickening, including the appendix. There are colonic diverticula without evidence of diverticulitis. Vascular/Lymphatic: Moderate to heavy mixed plaque in the aorta and iliac arteries. Enlarging aortocaval lymph node now measures 2.4 x 1.9 cm, previously 1.9 x 1.5 cm. No pelvic adenopathy. Reproductive: No prostatomegaly. Other: There is no free air, free hemorrhage or free fluid. There is no incarcerated hernia. Musculoskeletal: Osteopenia degenerative change lumbar spine. No regional fracture is seen or destructive bone lesion at the levels of the abdomen or pelvis. Again noted is a hypervascular mass in the lateral edge of the dorsal paraspinal musculature on the right, level of L2. There are stranding changes in the right flank subcutaneously which are increased from could be related to local trauma. IMPRESSION: 1. No acute intracranial CT findings or depressed skull fractures. 2. Right frontal lobe and left cerebellar stable hypodensities consistent with known metastases. 3. Osteopenia, degenerative and postsurgical changes of the cervical spine without evidence of fractures. 4. Subcutaneous stranding in the right flank region which may be traumatic. No other trauma related acute findings in the chest, abdomen or pelvis. 5. In the chest, interval enlargement is seen in the largest right paratracheal lymph node and there is increased thickening and  narrowing of the right main bronchus. 6. Innumerable metastatic pulmonary nodules are mostly not significantly changed. One of those described above does measure slightly larger, in the right lower lobe. 7. Stable small loculated right pleural effusion and adjacent streaky atelectasis or infiltrate. 8. Interval enlargement of an aortocaval space node in the abdomen. 9. Irregularity most likely due to metastatic disease in the right posterior ninth, eleventh and twelfth ribs with pathologic nondisplaced fracture again noted in the posterior right twelfth rib. 10. Hypervascular mass in the lateral edge of the dorsal paraspinal musculature at the level of L2, unchanged. 11. Aortic and coronary artery atherosclerosis. Electronically Signed   By: Telford Nab M.D.   On: 05/04/2022 01:49   CT Cervical Spine Wo Contrast  Result Date: 05/04/2022 CLINICAL DATA:  Frequent falls. On chemotherapy. History of hepatitis-C. Neuroendocrine carcinoma. EXAM: CT HEAD WITHOUT CONTRAST CT CERVICAL SPINE WITHOUT CONTRAST CT CHEST, ABDOMEN AND PELVIS WITH CONTRAST TECHNIQUE: Contiguous axial images were obtained from the base of the skull through the vertex without intravenous contrast. Multidetector CT imaging of the cervical spine was performed without intravenous contrast. Multiplanar CT image reconstructions were also generated. Multidetector CT  imaging of the chest, abdomen and pelvis was performed following the standard protocol during bolus administration of intravenous contrast. RADIATION DOSE REDUCTION: This exam was performed according to the departmental dose-optimization program which includes automated exposure control, adjustment of the mA and/or kV according to patient size and/or use of iterative reconstruction technique. CONTRAST:  114mL OMNIPAQUE IOHEXOL 300 MG/ML  SOLN COMPARISON:  Head CT 04/20/2022, CTA neck and reconstructions 11/09/2016, abdomen and pelvis CT 01/14/2022, CTA chest 12/17/2021, and chest, abdomen  and pelvis CT 03/14/2022. FINDINGS: CT HEAD FINDINGS Brain: There is known metastatic disease. Focal subcortical hypodensity is again noted in right superior frontal gyrus and left cerebellar hemisphere, and is unchanged. There are chronic lacunar infarcts in the basal ganglia and thalami, mild cerebral atrophy and small vessel disease. Vascular: There are scattered calcifications in the carotid siphons but no hyperdense central vessels. Skull: No fracture or focal lesion is seen. Sinuses/Orbits: There are small retention cysts and slight membrane thickening again in the right maxillary sinus. Trace fluid in the right mastoid tip. Other sinuses and mastoid air cells are clear. Nasal septum deviates to the right. Other: None. CT CERVICAL FINDINGS Alignment: There is 2 mm grade 1 degenerative anterolisthesis C3-4 and a slight cervical dextroscoliosis. No new alignment abnormality. Narrowing and osteophytes again noted anterolateral tibial joint. Skull base and vertebrae: There is osteopenia without evidence of fractures or focal bone lesion. Interval anterior plate fusion M0-L4 with solid interbody fusion is noted since the prior study. Soft tissues and spinal canal: No prevertebral fluid or swelling. No visible canal hematoma. There are calcifications in both proximal cervical ICAs. Disc levels: There is mild disc space loss C4-5 and C5-6. The discs are otherwise normal in height. There are mild posterior disc osteophyte complexes at C3-4, C4-5 and C5-6 but no significant encroachment on the thecal sac. There are small anterior endplate spurs J1-7 through C6-7. Multilevel facet joint and uncinate spurring. Resulting degenerative foraminal stenosis is mild on the right at C2-3, severe on the right and moderate to severe on the left at C3-4, bilaterally moderate to severe C4-5. Other cervical foramina are patent. Other:  None. CT CHEST FINDINGS Cardiovascular: Right IJ port catheter terminates in the right atrium. The  cardiac size is normal. There are coronary artery calcifications and CABG changes. There is no pericardial effusion. There is aortic and great vessel arthrosclerosis without aneurysm, stenosis or dissection. The pulmonary arteries and veins are normal in caliber. Mediastinum/Nodes: There are enlarged right paratracheal lymph nodes, largest of which is in the low right paratracheal space measuring 2.8 x 2.5 cm, on the last study measuring 2.5 x 3.3 cm. More superiorly in the right paratracheal chain is stable heterogeneous lymph node measuring 1.3 cm in short axis on 2:10. Slightly prominent subcarinal nodes up to 1 cm in short axis are unchanged. No enlarged hilar nodes are seen with calcified right hilar lymph nodes again shown. There is no axillary or supraclavicular adenopathy. No thyroid mass is seen. The trachea is clear. Unremarkable thoracic esophagus. Lungs/Pleura: There are innumerable nodular pulmonary metastases. Largest in the left upper lobe is seen posteriorly and fissural based, measuring stable at 1.8 x 1.1 cm on 3:54. Another index nodule is noted in the left lobe measuring 1.1 cm on 3:116 and is also stable. Another index nodule on the right in the lower lobe is 1.1 cm on 3:63 posterior to the right hilum it appears very slightly larger today, previously measuring 9.3 mm. Numerous other nodules are not significantly  changed. No increased nodularity is suspected. Small loculated posterior basal right pleural effusion appears similar as well as adjacent streaky atelectasis or consolidation in the right lower lung field. There is no pneumothorax or left pleural fluid. The left main bronchus unremarkable. There is increased thickening and narrowing in the right main bronchus. Musculoskeletal: Irregularity and heterogeneity in the posterior right eleventh and twelfth ribs is again seen as well as a nondisplaced presumably pathologic fracture of the posterior right twelfth rib. Similar irregularity again  noted in the posterior right ninth rib. There are degenerative changes of the thoracic spine osteopenia. CT ABDOMEN PELVIS FINDINGS Hepatobiliary: There are scattered tiny too small characterize hypodensities in the liver substance, unchanged without mass enhancement. Gallbladder and bile ducts are unremarkable. Pancreas: No focal abnormality. Spleen: Unremarkable. Adrenals/Urinary Tract: There is no adrenal mass. Stable 2.1 cm left renal cyst not requiring follow-up. There is a 2 mm nonobstructive caliceal stone in the superior pole right kidney and a few subcentimeter too small to characterize hypodensities in the upper right kidney. The bladder is normal thickness considering the degree of distention. Stomach/Bowel: No dilatation or wall thickening, including the appendix. There are colonic diverticula without evidence of diverticulitis. Vascular/Lymphatic: Moderate to heavy mixed plaque in the aorta and iliac arteries. Enlarging aortocaval lymph node now measures 2.4 x 1.9 cm, previously 1.9 x 1.5 cm. No pelvic adenopathy. Reproductive: No prostatomegaly. Other: There is no free air, free hemorrhage or free fluid. There is no incarcerated hernia. Musculoskeletal: Osteopenia degenerative change lumbar spine. No regional fracture is seen or destructive bone lesion at the levels of the abdomen or pelvis. Again noted is a hypervascular mass in the lateral edge of the dorsal paraspinal musculature on the right, level of L2. There are stranding changes in the right flank subcutaneously which are increased from could be related to local trauma. IMPRESSION: 1. No acute intracranial CT findings or depressed skull fractures. 2. Right frontal lobe and left cerebellar stable hypodensities consistent with known metastases. 3. Osteopenia, degenerative and postsurgical changes of the cervical spine without evidence of fractures. 4. Subcutaneous stranding in the right flank region which may be traumatic. No other trauma related  acute findings in the chest, abdomen or pelvis. 5. In the chest, interval enlargement is seen in the largest right paratracheal lymph node and there is increased thickening and narrowing of the right main bronchus. 6. Innumerable metastatic pulmonary nodules are mostly not significantly changed. One of those described above does measure slightly larger, in the right lower lobe. 7. Stable small loculated right pleural effusion and adjacent streaky atelectasis or infiltrate. 8. Interval enlargement of an aortocaval space node in the abdomen. 9. Irregularity most likely due to metastatic disease in the right posterior ninth, eleventh and twelfth ribs with pathologic nondisplaced fracture again noted in the posterior right twelfth rib. 10. Hypervascular mass in the lateral edge of the dorsal paraspinal musculature at the level of L2, unchanged. 11. Aortic and coronary artery atherosclerosis. Electronically Signed   By: Telford Nab M.D.   On: 05/04/2022 01:49   CT CHEST ABDOMEN PELVIS W CONTRAST  Result Date: 05/04/2022 CLINICAL DATA:  Frequent falls. On chemotherapy. History of hepatitis-C. Neuroendocrine carcinoma. EXAM: CT HEAD WITHOUT CONTRAST CT CERVICAL SPINE WITHOUT CONTRAST CT CHEST, ABDOMEN AND PELVIS WITH CONTRAST TECHNIQUE: Contiguous axial images were obtained from the base of the skull through the vertex without intravenous contrast. Multidetector CT imaging of the cervical spine was performed without intravenous contrast. Multiplanar CT image reconstructions were also  generated. Multidetector CT imaging of the chest, abdomen and pelvis was performed following the standard protocol during bolus administration of intravenous contrast. RADIATION DOSE REDUCTION: This exam was performed according to the departmental dose-optimization program which includes automated exposure control, adjustment of the mA and/or kV according to patient size and/or use of iterative reconstruction technique. CONTRAST:  154mL  OMNIPAQUE IOHEXOL 300 MG/ML  SOLN COMPARISON:  Head CT 04/20/2022, CTA neck and reconstructions 11/09/2016, abdomen and pelvis CT 01/14/2022, CTA chest 12/17/2021, and chest, abdomen and pelvis CT 03/14/2022. FINDINGS: CT HEAD FINDINGS Brain: There is known metastatic disease. Focal subcortical hypodensity is again noted in right superior frontal gyrus and left cerebellar hemisphere, and is unchanged. There are chronic lacunar infarcts in the basal ganglia and thalami, mild cerebral atrophy and small vessel disease. Vascular: There are scattered calcifications in the carotid siphons but no hyperdense central vessels. Skull: No fracture or focal lesion is seen. Sinuses/Orbits: There are small retention cysts and slight membrane thickening again in the right maxillary sinus. Trace fluid in the right mastoid tip. Other sinuses and mastoid air cells are clear. Nasal septum deviates to the right. Other: None. CT CERVICAL FINDINGS Alignment: There is 2 mm grade 1 degenerative anterolisthesis C3-4 and a slight cervical dextroscoliosis. No new alignment abnormality. Narrowing and osteophytes again noted anterolateral tibial joint. Skull base and vertebrae: There is osteopenia without evidence of fractures or focal bone lesion. Interval anterior plate fusion V7-O1 with solid interbody fusion is noted since the prior study. Soft tissues and spinal canal: No prevertebral fluid or swelling. No visible canal hematoma. There are calcifications in both proximal cervical ICAs. Disc levels: There is mild disc space loss C4-5 and C5-6. The discs are otherwise normal in height. There are mild posterior disc osteophyte complexes at C3-4, C4-5 and C5-6 but no significant encroachment on the thecal sac. There are small anterior endplate spurs Y0-7 through C6-7. Multilevel facet joint and uncinate spurring. Resulting degenerative foraminal stenosis is mild on the right at C2-3, severe on the right and moderate to severe on the left at  C3-4, bilaterally moderate to severe C4-5. Other cervical foramina are patent. Other:  None. CT CHEST FINDINGS Cardiovascular: Right IJ port catheter terminates in the right atrium. The cardiac size is normal. There are coronary artery calcifications and CABG changes. There is no pericardial effusion. There is aortic and great vessel arthrosclerosis without aneurysm, stenosis or dissection. The pulmonary arteries and veins are normal in caliber. Mediastinum/Nodes: There are enlarged right paratracheal lymph nodes, largest of which is in the low right paratracheal space measuring 2.8 x 2.5 cm, on the last study measuring 2.5 x 3.3 cm. More superiorly in the right paratracheal chain is stable heterogeneous lymph node measuring 1.3 cm in short axis on 2:10. Slightly prominent subcarinal nodes up to 1 cm in short axis are unchanged. No enlarged hilar nodes are seen with calcified right hilar lymph nodes again shown. There is no axillary or supraclavicular adenopathy. No thyroid mass is seen. The trachea is clear. Unremarkable thoracic esophagus. Lungs/Pleura: There are innumerable nodular pulmonary metastases. Largest in the left upper lobe is seen posteriorly and fissural based, measuring stable at 1.8 x 1.1 cm on 3:54. Another index nodule is noted in the left lobe measuring 1.1 cm on 3:116 and is also stable. Another index nodule on the right in the lower lobe is 1.1 cm on 3:63 posterior to the right hilum it appears very slightly larger today, previously measuring 9.3 mm. Numerous other nodules  are not significantly changed. No increased nodularity is suspected. Small loculated posterior basal right pleural effusion appears similar as well as adjacent streaky atelectasis or consolidation in the right lower lung field. There is no pneumothorax or left pleural fluid. The left main bronchus unremarkable. There is increased thickening and narrowing in the right main bronchus. Musculoskeletal: Irregularity and  heterogeneity in the posterior right eleventh and twelfth ribs is again seen as well as a nondisplaced presumably pathologic fracture of the posterior right twelfth rib. Similar irregularity again noted in the posterior right ninth rib. There are degenerative changes of the thoracic spine osteopenia. CT ABDOMEN PELVIS FINDINGS Hepatobiliary: There are scattered tiny too small characterize hypodensities in the liver substance, unchanged without mass enhancement. Gallbladder and bile ducts are unremarkable. Pancreas: No focal abnormality. Spleen: Unremarkable. Adrenals/Urinary Tract: There is no adrenal mass. Stable 2.1 cm left renal cyst not requiring follow-up. There is a 2 mm nonobstructive caliceal stone in the superior pole right kidney and a few subcentimeter too small to characterize hypodensities in the upper right kidney. The bladder is normal thickness considering the degree of distention. Stomach/Bowel: No dilatation or wall thickening, including the appendix. There are colonic diverticula without evidence of diverticulitis. Vascular/Lymphatic: Moderate to heavy mixed plaque in the aorta and iliac arteries. Enlarging aortocaval lymph node now measures 2.4 x 1.9 cm, previously 1.9 x 1.5 cm. No pelvic adenopathy. Reproductive: No prostatomegaly. Other: There is no free air, free hemorrhage or free fluid. There is no incarcerated hernia. Musculoskeletal: Osteopenia degenerative change lumbar spine. No regional fracture is seen or destructive bone lesion at the levels of the abdomen or pelvis. Again noted is a hypervascular mass in the lateral edge of the dorsal paraspinal musculature on the right, level of L2. There are stranding changes in the right flank subcutaneously which are increased from could be related to local trauma. IMPRESSION: 1. No acute intracranial CT findings or depressed skull fractures. 2. Right frontal lobe and left cerebellar stable hypodensities consistent with known metastases. 3.  Osteopenia, degenerative and postsurgical changes of the cervical spine without evidence of fractures. 4. Subcutaneous stranding in the right flank region which may be traumatic. No other trauma related acute findings in the chest, abdomen or pelvis. 5. In the chest, interval enlargement is seen in the largest right paratracheal lymph node and there is increased thickening and narrowing of the right main bronchus. 6. Innumerable metastatic pulmonary nodules are mostly not significantly changed. One of those described above does measure slightly larger, in the right lower lobe. 7. Stable small loculated right pleural effusion and adjacent streaky atelectasis or infiltrate. 8. Interval enlargement of an aortocaval space node in the abdomen. 9. Irregularity most likely due to metastatic disease in the right posterior ninth, eleventh and twelfth ribs with pathologic nondisplaced fracture again noted in the posterior right twelfth rib. 10. Hypervascular mass in the lateral edge of the dorsal paraspinal musculature at the level of L2, unchanged. 11. Aortic and coronary artery atherosclerosis. Electronically Signed   By: Telford Nab M.D.   On: 05/04/2022 01:49   DG Chest Port 1 View  Result Date: 05/04/2022 CLINICAL DATA:  Frequent falls, weakness, on chemo followed by [facility]. EXAM: DG HIP (WITH OR WITHOUT PELVIS) 2-3V RIGHT; PORTABLE CHEST - 1 VIEW COMPARISON:  Portable chest 04/20/2022, chest CT 03/14/2022, CT abdomen and pelvis and reconstructions 01/14/2022. FINDINGS: Chest: There are innumerable bilateral pulmonary nodules consistent with metastases. Small right pleural effusion is unchanged. No focal pneumonia is evident.  Heart size and vascular pattern normal with CABG changes and right chest IJ port catheter again terminating in the right atrium. There is aortic atherosclerosis with stable mediastinum. There is osteopenia mild thoracic levoscoliosis and spondylosis. AP pelvis and AP and frog-leg  right hip: There is no evidence of hip fracture or dislocation. There is no evidence of arthropathy or other focal bone abnormality. There are scattered calcifications in the right femoral artery. IMPRESSION: 1. Stable radiographic chest findings with innumerable nodular lung metastases. No acute abnormality. 2. Small right pleural effusion, unchanged. 3. No AP pelvic fracture or diastasis is seen. No right hip fracture is seen. Electronically Signed   By: Telford Nab M.D.   On: 05/04/2022 00:49   DG Hip Unilat W or Wo Pelvis 2-3 Views Right  Result Date: 05/04/2022 CLINICAL DATA:  Frequent falls, weakness, on chemo followed by [facility]. EXAM: DG HIP (WITH OR WITHOUT PELVIS) 2-3V RIGHT; PORTABLE CHEST - 1 VIEW COMPARISON:  Portable chest 04/20/2022, chest CT 03/14/2022, CT abdomen and pelvis and reconstructions 01/14/2022. FINDINGS: Chest: There are innumerable bilateral pulmonary nodules consistent with metastases. Small right pleural effusion is unchanged. No focal pneumonia is evident. Heart size and vascular pattern normal with CABG changes and right chest IJ port catheter again terminating in the right atrium. There is aortic atherosclerosis with stable mediastinum. There is osteopenia mild thoracic levoscoliosis and spondylosis. AP pelvis and AP and frog-leg right hip: There is no evidence of hip fracture or dislocation. There is no evidence of arthropathy or other focal bone abnormality. There are scattered calcifications in the right femoral artery. IMPRESSION: 1. Stable radiographic chest findings with innumerable nodular lung metastases. No acute abnormality. 2. Small right pleural effusion, unchanged. 3. No AP pelvic fracture or diastasis is seen. No right hip fracture is seen. Electronically Signed   By: Telford Nab M.D.   On: 05/04/2022 00:49   CT Head Wo Contrast  Result Date: 04/20/2022 CLINICAL DATA:  Weakness and dizziness.  History of lung cancer. EXAM: CT HEAD WITHOUT CONTRAST  TECHNIQUE: Contiguous axial images were obtained from the base of the skull through the vertex without intravenous contrast. RADIATION DOSE REDUCTION: This exam was performed according to the departmental dose-optimization program which includes automated exposure control, adjustment of the mA and/or kV according to patient size and/or use of iterative reconstruction technique. COMPARISON:  MRI brain dated March 16, 2022. CT head dated March 14, 2022. FINDINGS: Brain: No evidence of acute infarction, hemorrhage, hydrocephalus, or extra-axial collection. Unchanged focal hypodensity in the anterior right frontal superior gyrus and left cerebellum. Unchanged mild generalized cerebral atrophy. Vascular: No hyperdense vessel or unexpected calcification. Skull: Normal. Negative for fracture or focal lesion. Sinuses/Orbits: No acute finding. Other: None. IMPRESSION: 1. No acute intracranial abnormality. 2. Unchanged focal hypodensity in the anterior right frontal superior gyrus and left cerebellum, consistent with known metastatic disease. No CT evidence of significant progression. Electronically Signed   By: Titus Dubin M.D.   On: 04/20/2022 13:27   DG Chest Port 1 View  Result Date: 04/20/2022 CLINICAL DATA:  Weakness.  History of metastatic lung cancer. EXAM: PORTABLE CHEST 1 VIEW COMPARISON:  03/14/2022 CT. FINDINGS: The cardiomediastinal silhouette is unchanged. CABG changes and RIGHT IJ Port-A-Cath with tip overlying the SUPERIOR cavoatrial junction again noted. Pulmonary vascular congestion and small RIGHT pleural effusion are noted. RIGHT basilar opacity/atelectasis appear slightly increased. Multiple pulmonary nodules are again identified. There is no evidence of pneumothorax. IMPRESSION: Slightly increased RIGHT basilar opacity/atelectasis, small RIGHT  pleural effusion and pulmonary vascular congestion. Multiple pulmonary nodules/metastases again identified Electronically Signed   By: Margarette Canada M.D.   On:  04/20/2022 12:12    Microbiology: Results for orders placed or performed during the hospital encounter of 05/03/22  Culture, blood (routine x 2)     Status: None   Collection Time: 05/03/22 11:24 PM   Specimen: BLOOD  Result Value Ref Range Status   Specimen Description   Final    BLOOD Performed at [facility], [address] [place], [place] [address]    Special Requests   Final    BOTTLES DRAWN AEROBIC AND ANAEROBIC Blood Culture results may not be optimal due to an excessive volume of blood received in culture bottles Performed at [facility], [address] [place], [place] [address]    Culture   Final    NO GROWTH 5 DAYS Performed at [facility], [place] [address], [place], [place] [address]    Report Status 05/08/2022 FINAL  Final  Culture, blood (routine x 2)     Status: None   Collection Time: 05/04/22  3:19 PM   Specimen: BLOOD  Result Value Ref Range Status   Specimen Description   Final    BLOOD BLOOD LEFT ARM Performed at [place] [address] [place], [place] [address]    Special Requests   Final    BOTTLES DRAWN AEROBIC ONLY Blood Culture adequate volume Performed at [facility] [address] [place], [place] [address]    Culture   Final    NO GROWTH 5 DAYS Performed at [facility], [place] [address], [place], [place] [address]    Report Status 05/09/2022 FINAL  Final   *Note: Due to a large number of results and/or encounters for the requested time period, some results have not been displayed. A complete set of results can be found in Results Review.    Labs: CBC: Recent Labs  Lab 05/08/22 0457  WBC 5.1  NEUTROABS 3.1  HGB 10.0*  HCT 31.4*  MCV 95.7  PLT 102   Basic Metabolic Panel: Recent Labs  Lab 05/08/22 0457 05/10/22 1300  NA 139 139  K 3.3* 3.7  CL 110 106  CO2 24 26  GLUCOSE 97 112*  BUN 7* 15  CREATININE 0.69 0.72  CALCIUM 8.6* 8.9   Liver Function  Tests: No results for input(s): "AST", "ALT", "ALKPHOS", "BILITOT", "PROT", "ALBUMIN" in the last 168 hours. CBG: No results for input(s): "GLUCAP" in the last 168 hours.  Discharge time spent: 45 minutes.   Signed: Hosie Poisson, MD [facility] 05/13/2022

## 2022-05-13 NOTE — Progress Notes (Signed)
PMR Admission Coordinator Pre-Admission Assessment   Patient: [name] is an 66 y.o., male MRN: [id number] DOB: April 03, 1956 Height: 5' 9" (175.3 cm) Weight: 62.6 kg   Insurance Information HMO:     PPO:      PCP:      IPA:      80/20:      OTHER:  PRIMARY: medicare A/B      Policy#: [id number]      Subscriber:  CM Name:       Phone#:      Fax#:  Pre-Cert#:       Employer:  Benefits:  Phone #:      Name:  Eff. Date: 03/30/20 A/B     Deduct: $1600      Out of Pocket Max: n/a      Life Max: n/a CIR: 100%      SNF: 20 full days Outpatient: 80%     Co-Ins: 20% Home Health: 100%      Co-Pay:  DME: 80%     Co-Ins: 20% Providers: pt choice SECONDARY: Alcario Drought      Policy#: [id number]     Phone#:    Financial Counselor:       Phone#:    The "Data Collection Information Summary" for patients in Inpatient Rehabilitation Facilities with attached "Privacy Act [place] Records" was provided and verbally reviewed with: Patient and Family   Emergency Contact Information Contact Information       Name Relation Home Work [place] Spouse [phone]   [place] Daughter [phone]   [phone]    Buel Ream     [phone]           Current Medical History  Patient Admitting Diagnosis: debility    History of Present Illness: Pt is a 66 y/o male with PMH of afib (on Xarelto), anemia, basal cell carcinoma, CAD s/p CABG, CAD, tacchycardia, hepatitis C, HTN, ischemic cardiomyopathy, and 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  He was admitted to [facility] on 05/03/22 with several week progression of weakness and multiple falls associated with shakiness and brief/blank stare.  On arrival to ED, labs and vitals essentially unremarkable, CXR shows stable innumerable lung  mets, small right pleural effusion, and no fractures.  CTH/spine/chest/abd/pelvis showed relatively stable metastatic disease.  Neurology was consulted and recommended LTM EEG and possible Holter monitor as an outpatient to assess for arrhythmias.  EEG essentially negative, and syncope episodes/spells thought to be related to symptomatic orthostatic hypotension.  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  Cards workup included labs showing sodium 139, creatinine .69, BUN 7, Hgb 10; orthostatic BPs (systolic 387>56>43).  Cards felt orthostatic BP likely contributing to multiple falls.  Recommended maintain hydration, thigh-high compression stockings and abdominal binder.  Stopped ativan and cymbalta, and started midodrine 28m TID, increased to 168mTID.  Recommend starting Mestinon if orthostasis persists.  Therapy ongoing and pt was recommended for CIR.    [facility] been reviewed by the rehabilitation admission coordinator and physician.   Past Medical History      Past Medical History:  Diagnosis Date   Anemia     Anxiety     Arthritis  Basal cell carcinoma (BCC) of forehead     CAD (coronary artery disease)      a. 10/2015 ant STEMI >> LHC with 3 v CAD; oLAD tx with POBA >> emergent CABG. b. Multiple evals since that time, early graft failure of SVG-RCA by cath 03/2016. c. 2/19 PCI/DES x1 to pRCA, normal EF.   Carotid artery disease ([place])      a. 40-59% BICA 02/2018.   Depression     Dyspnea     Ectopic atrial tachycardia (HCC)     Esophageal reflux      eosinophil esophagitis   Family history of adverse reaction to anesthesia      "sister has PONV" (06/21/2017)   Former tobacco use     Gout     Hepatitis C      "treated and cured" (06/21/2017)   High cholesterol     History of blood transfusion     History of kidney  stones     Hypertension     Ischemic cardiomyopathy      a. EF 25-30% at intraop TEE 4/17  //  b. Limited Echo 5/17 - EF 45-50%, mild ant HK. c. EF 55-65% by cath 09/2017.   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[place])     Migraine      "3-4/yr" (06/21/2017)   Myocardial infarction ([place]) 10/2015   Palpitations     Pneumonia     Sinus bradycardia      a. HR dropping into 40s in 02/2016 -> BB reduced.   Stroke [facility]) 10/2016    "small one; sometimes my memory/cognitive issues" (06/21/2017)   Symptomatic hypotension      a. 02/2016 ER visit -> meds reduced.   Syncope     Wears dentures     Wears glasses        Has the patient had major surgery during 100 days prior to admission? No   Family History   family history includes Heart Problems in his father; Heart attack in his maternal grandmother and paternal uncle; Heart attack (age of onset: 35) in his father; Heart failure in his father; Hypertension in his brother; Lung cancer in his mother; Stroke in his father and maternal grandmother.   Current Medications   Current Facility-Administered Medications:    alum & mag hydroxide-simeth (MAALOX/MYLANTA) 200-200-20 MG/5ML suspension 30 mL, 30 mL, Oral, Q4H PRN, Hosie Poisson, MD, 30 mL at 05/12/22 2115   benzonatate (TESSALON) capsule 100 mg, 100 mg, Oral, TID PRN, Marylyn Ishihara, Tyrone A, DO, 100 mg at 05/13/22 0843   bisacodyl (DULCOLAX) EC tablet 5 mg, 5 mg, Oral, Daily PRN, Marylyn Ishihara, Tyrone A, DO   chlorhexidine gluconate (MEDLINE KIT) (PERIDEX) 0.12 % solution 15 mL, 15 mL, Mouth Rinse, BID, Kyle, Tyrone A, DO, 15 mL at 05/13/22 0845   Chlorhexidine Gluconate Cloth 2 % PADS 6 each, 6 each, Topical, Daily, Kyle, Tyrone A, DO, 6 each at 05/13/22 1010   HYDROmorphone (DILAUDID) tablet 4 mg, 4 mg, Oral, Q4H PRN, Hosie Poisson, MD, 4 mg at 05/13/22 0843   hydrOXYzine (ATARAX) tablet 10 mg, 10 mg, Oral, TID PRN, Hosie Poisson, MD, 10 mg at 05/08/22 0459   methocarbamol (ROBAXIN) tablet 500 mg, 500 mg, Oral,  Q8H PRN, Hosie Poisson, MD, 500 mg at 05/12/22 0418   midodrine (PROAMATINE) tablet 10 mg, 10 mg, Oral, TID WC, Hosie Poisson, MD, 10 mg at 05/13/22 0844   morphine (MS CONTIN) 12 hr tablet 60 mg, 60 mg, Oral, Q12H, Akula, Vijaya,  MD, 60 mg at 05/13/22 1001   ondansetron (ZOFRAN) tablet 4 mg, 4 mg, Oral, Q6H PRN, 4 mg at 05/13/22 0519 **OR** ondansetron (ZOFRAN) injection 4 mg, 4 mg, Intravenous, Q6H PRN, Marylyn Ishihara, Tyrone A, DO, 4 mg at 05/12/22 1801   Oral care mouth rinse, 15 mL, Mouth Rinse, 10 times per day, Kyle, Tyrone A, DO, 15 mL at 05/13/22 1011   rivaroxaban (XARELTO) tablet 20 mg, 20 mg, Oral, QPM, Kyle, Tyrone A, DO, 20 mg at 05/12/22 1757   Patients Current Diet:  Diet Order                  Diet regular Room service appropriate? Yes; Fluid consistency: Thin  Diet effective now                         Precautions / Restrictions Precautions Precautions: Fall Precaution Comments: orthostatic, monitor HR Other Brace: has abdominal binder and TEDs Restrictions Weight Bearing Restrictions: No    Has the patient had 2 or more falls or a fall with injury in the past year? Yes   Prior Activity Level Household: limited mobility with RW, has been falling recently, needs assist for all ADLs (recently hired an aid to help)   Prior Functional Level Self Care: Did the patient need help bathing, dressing, using the toilet or eating? Needed some help   Indoor Mobility: Did the patient need assistance with walking from room to room (with or without device)? Needed some help   Stairs: Did the patient need assistance with internal or external stairs (with or without device)? Needed some help   Functional Cognition: Did the patient need help planning regular tasks such as shopping or remembering to take medications? Needed some help   Patient Information Are you of Hispanic, Latino/a,or Spanish origin?: A. No, not of Hispanic, Latino/a, or Spanish origin What is your race?: A.  White Do you need or want an interpreter to communicate with a doctor or health care staff?: 0. No   Patient's Response To:  Health Literacy and Transportation Is the patient able to respond to health literacy and transportation needs?: Yes Health Literacy - How often do you need to have someone help you when you read instructions, pamphlets, or other written material from your doctor or pharmacy?: Never In the past 12 months, has lack of transportation kept you from medical appointments or from getting medications?: No In the past 12 months, has lack of transportation kept you from meetings, work, or from getting things needed for daily living?: No   Home Assistive Devices / [place] Devices/Equipment: Radio producer (specify quad or straight), Eyeglasses, Dentures (specify type), Walker (specify type) Home Equipment: Cane - quad, Rollator (4 wheels)   Prior Device Use: Indicate devices/aids used by the patient prior to current illness, exacerbation or injury? Walker   Current Functional Level Cognition   Overall Cognitive Status: Within Functional Limits for tasks assessed Orientation Level: Oriented X4 General Comments: noted to have some memory issues with patient calling wife to ask where phone is during session after giving wife phone just 10 mins prior to complete task for him while in therapy. patient requested to call to verify even with education that patients wife had it    Extremity Assessment (includes Sensation/Coordination)   Upper Extremity Assessment: RUE deficits/detail, LUE deficits/detail, Generalized weakness RUE Deficits / Details: new tremor present RUE Coordination: decreased fine motor, decreased gross motor LUE Deficits / Details: new tremor  present L>R functionally getting in the way of independence LUE Coordination: decreased fine motor, decreased gross motor  Lower Extremity Assessment: Defer to PT evaluation     ADLs   Overall ADL's : Needs  assistance/impaired Eating/Feeding: Set up, Sitting Eating/Feeding Details (indicate cue type and reason): patient trialed weighted cuffs on LUE with reportes of decreased tremor like movement. patient was educated on weighted gloves with noted tremors in digits and patient reporting desire to be able to text and write as long as possible. patient was shown different options via computer. Grooming: Set up, Sitting Grooming Details (indicate cue type and reason): in recliner Upper Body Bathing: Minimal assistance, Sitting Lower Body Bathing: Minimal assistance, Sitting/lateral leans Upper Body Dressing : Set up Lower Body Dressing: Min guard, Sitting/lateral leans Lower Body Dressing Details (indicate cue type and reason): patient was able to don bilateral shoes with min guard sititng EOB Toilet Transfer: Min guard, Stand-pivot, Rolling walker (2 wheels) Toilet Transfer Details (indicate cue type and reason): simulated with recliner transfer Toileting- Clothing Manipulation and Hygiene: Minimal assistance Tub/ Banker: Tub transfer, Moderate assistance Tub/Shower Transfer Details (indicate cue type and reason): gets down in the tub because he gets too cold out of the water Functional mobility during ADLs: Min guard, Rolling walker (2 wheels) General ADL Comments: patient was noted to have HR increase to 148 bpm with standing at EOB. patient completed tranfer with min guard to recliner in room with increased time and cues for proper sequencing. patient's HR was noted to reduce to 98 bpm with rest in chair. patient reported feelign SOB while standing but dissapated when seated. nurse made aware.     Mobility   Overal bed mobility: Modified Independent Bed Mobility: Supine to Sit Supine to sit: Modified independent (Device/Increase time), HOB elevated General bed mobility comments: upon sitting EOB, had pt perform UE movements such as reaching, shoulder abduction and shoulder flexion for  approx 2 minutes prior to standing     Transfers   Overall transfer level: Needs assistance Equipment used: Rolling walker (2 wheels) Transfers: Sit to/from Stand Sit to Stand: Min assist Bed to/from chair/wheelchair/BSC transfer type:: Step pivot Step pivot transfers: Min assist General transfer comment: assist to rise and steady, pt marched in place approx 30 seconds and then returned to sitting due to fatigue; pt performed again min assist to rise and steady, pt able to control descent     Ambulation / Gait / Stairs / Emergency planning/management officer   Ambulation/Gait Ambulation/Gait assistance: Min assist, Counsellor (Feet): 120 Feet Assistive device: Rolling walker (2 wheels) Gait Pattern/deviations: Decreased stride length, Step-through pattern, Trunk flexed General Gait Details: assist for stability, pt reported mild dizziness however states different type then symptoms from orthostasis; pt c/o chest "tightness" after ambulating however no pain, SpO2 98%; HR was elevated to 148 bpm during ambulation however 105 bpm upon sitting in recliner; recliner following during ambulation for safety Gait velocity: decreased     Posture / Balance Balance Overall balance assessment: Needs assistance Sitting-balance support: Feet supported Sitting balance-Leahy Scale: Good Standing balance support: During functional activity, Reliant on assistive device for balance Standing balance-Leahy Scale: Poor Standing balance comment: single UE support 2* dizziness     Special needs/care consideration Special service needs cancer treatment on hold, multimodal pain control    Previous Home Environment (from acute therapy documentation) Living Arrangements: Spouse/significant other Available Help at Discharge: Family, Available 24 hours/day Type of Home: House Home Layout: Two level, Bed/bath  upstairs Alternate Level Stairs-Rails: Can reach both Alternate Level Stairs-Number of Steps: 13 Home  Access: Stairs to enter Entrance Stairs-Rails: Right, Left Entrance Stairs-Number of Steps: 6 Bathroom Shower/Tub: Tub/shower unit Home Care Services: Yes Additional Comments: daughter staying through mid september to assist, lives [place] of state. Been sleeping downstairs on couch. 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   Discharge Living Setting Plans for Discharge Living Setting: Patient's home, Lives with (comment) (spouse) Type of Home at Discharge: House Discharge Home Layout: Two level, Bed/bath upstairs Alternate Level Stairs-Rails: Can reach both Alternate Level Stairs-Number of Steps: 13 Discharge Home Access: Stairs to enter Entrance Stairs-Rails: Right, Left Entrance Stairs-Number of Steps: 6 Discharge Bathroom Shower/Tub: Tub/shower unit Discharge Bathroom Toilet: Standard Discharge Bathroom Accessibility: Yes How Accessible: Accessible via walker Does the patient have any problems obtaining your medications?: No   Social/Family/Support Systems Patient Roles: Spouse Anticipated Caregiver: spouse, [name] Anticipated Caregiver's Contact Information: [phone] Ability/Limitations of Caregiver: supervision Caregiver Availability: 24/7 Discharge Plan Discussed with Primary Caregiver: Yes Is Caregiver In Agreement with Plan?: Yes Does Caregiver/Family have Issues with Lodging/Transportation while Pt is in Rehab?: No   Goals Patient/Family Goal for Rehab: PT/OT supervision, SLP n/a Expected length of stay: 9-11 days Additional Information: cancer trial from [place] on hold while inpatient Pt/Family Agrees to Admission and willing to participate: Yes Program Orientation Provided & Reviewed with Pt/Caregiver Including Roles  & Responsibilities: Yes  Barriers to Discharge: Home environment access/layout, Decreased caregiver support   Decrease burden of Care through IP rehab admission: n/a   Possible need for SNF placement upon discharge: not anticipated.     Patient Condition: I have reviewed medical records from [facility], spoken with CM, and patient and spouse. I discussed via phone for inpatient rehabilitation assessment.  Patient will benefit from ongoing PT and OT, can actively participate in 3 hours of therapy a day 5 days of the week, and can make measurable gains during the admission.  Patient will also benefit from the coordinated team approach during an Inpatient Acute Rehabilitation admission.  The patient will receive intensive therapy as well as Rehabilitation physician, nursing, social worker, and care management interventions.  Due to safety, skin/wound care, disease management, medication administration, pain management, and patient education the patient requires 24 hour a day rehabilitation nursing.  The patient is currently min assist with mobility and basic ADLs.  Discharge setting and therapy post discharge at home with home health is anticipated.  Patient has agreed to participate in the Acute Inpatient Rehabilitation Program and will admit today.   Preadmission Screen Completed By:  Michel Santee, PT, DPT 05/13/2022 10:17 AM ______________________________________________________________________   Discussed status with Dr. Tressa Busman on 05/13/22  at 10:17 AM  and received approval for admission today.   Admission Coordinator:  Michel Santee, PT, DPT time 10:17 AM Sudie Grumbling 05/13/22     Assessment/Plan: Diagnosis: Does the need for close, 24 hr/day Medical supervision in concert with the patient's rehab needs make it unreasonable for this patient to be served in a less intensive setting? 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 Yes Does the patient require coordinated care of a physician, rehab nurse, PT, OT, to address physical and functional deficits in the context of the  above medical diagnosis(es)? Yes Addressing deficits in the following areas: balance, endurance, locomotion, strength, transferring, bathing, dressing, feeding, grooming, toileting, and psychosocial support Can the patient actively participate in an intensive therapy program of at least 3 hrs of therapy 5 days a week? Yes The potential for patient to make measurable gains while on inpatient rehab is  Anticipated functional outcomes upon discharge from inpatient rehab: modified independent and supervision PT, modified independent and supervision OT Estimated rehab length of stay to reach the above functional goals is: 10-14 days Anticipated discharge destination: Home 10. Overall Rehab/Functional Prognosis: excellent     MD Signature:   Gertie Gowda, DO 05/13/2022

## 2022-05-13 NOTE — Progress Notes (Addendum)
Rounding Note    Patient Name: [name] Date of Encounter: 05/13/2022  [place] Cardiologist: Larae Grooms, MD   Subjective   Orthostatic BPs improved yesterday but HR still bumps up to 140's when up ambulating around.  I walked in this morning in his compression hose wore off and his abdominal binder wore off as well.  He states that he cannot put them on and nursing told him to keep them off.  After talking with the nurse the nurse said that he told him that he was capable of putting them on and if he would not keep them on he would not be allowed to get out of bed.  The patient tells me he is going to rehab today.  Inpatient Medications    Scheduled Meds:  chlorhexidine gluconate (MEDLINE KIT)  15 mL Mouth Rinse BID   Chlorhexidine Gluconate Cloth  6 each Topical Daily   midodrine  10 mg Oral TID WC   morphine  60 mg Oral Q12H   mouth rinse  15 mL Mouth Rinse 10 times per day   rivaroxaban  20 mg Oral QPM   Continuous Infusions:  PRN Meds: alum & mag hydroxide-simeth, benzonatate, bisacodyl, HYDROmorphone, hydrOXYzine, methocarbamol, ondansetron **OR** ondansetron (ZOFRAN) IV   Vital Signs    Vitals:   05/12/22 0550 05/12/22 1418 05/12/22 2043 05/13/22 0430  BP: 121/69 119/75 122/66 107/67  Pulse: 70 61 64 77  Resp: 18 16 18 18   Temp: 98.6 F (37 C) 98.8 F (37.1 C) 98.5 F (36.9 C) 98.7 F (37.1 C)  TempSrc: Oral Oral Oral Oral  SpO2: 99% 100% 98% 99%  Weight:      Height:        Intake/Output Summary (Last 24 hours) at 05/13/2022 0939 Last data filed at 05/13/2022 0700 Gross per 24 hour  Intake --  Output 400 ml  Net -400 ml       05/03/2022    9:25 PM 03/30/2022    8:26 AM 03/22/2022    3:56 PM  Last 3 Weights  Weight (lbs) 138 lb 147 lb 14.4 oz 151 lb 14.4 oz  Weight (kg) 62.596 kg 67.087 kg 68.9 kg      Telemetry    NSR- Personally Reviewed  ECG    No new EKG to review - Personally Reviewed  Physical Exam   GEN:  Well nourished, well developed in no acute distress HEENT: Normal NECK: No JVD; No carotid bruits LYMPHATICS: No lymphadenopathy CARDIAC:RRR, no murmurs, rubs, gallops RESPIRATORY:  Clear to auscultation without rales, wheezing or rhonchi  ABDOMEN: Soft, non-tender, non-distended MUSCULOSKELETAL:  No edema; No deformity  SKIN: Warm and dry NEUROLOGIC:  Alert and oriented x 3 PSYCHIATRIC:  Normal affect  Labs    High Sensitivity Troponin:   Recent Labs  Lab 04/20/22 1144 04/20/22 1344  TROPONINIHS 4 4      Chemistry Recent Labs  Lab 05/08/22 0457 05/10/22 1300  NA 139 139  K 3.3* 3.7  CL 110 106  CO2 24 26  GLUCOSE 97 112*  BUN 7* 15  CREATININE 0.69 0.72  CALCIUM 8.6* 8.9  GFRNONAA >60 >60  ANIONGAP 5 7     Lipids No results for input(s): "CHOL", "TRIG", "HDL", "LABVLDL", "LDLCALC", "CHOLHDL" in the last 168 hours.  Hematology Recent Labs  Lab 05/08/22 0457  WBC 5.1  RBC 3.28*  HGB 10.0*  HCT 31.4*  MCV 95.7  MCH 30.5  MCHC 31.8  RDW 14.1  PLT 217    Thyroid No results for input(s): "TSH", "FREET4" in the last 168 hours.  BNPNo results for input(s): "BNP", "PROBNP" in the last 168 hours.  DDimer No results for input(s): "DDIMER" in the last 168 hours.   Radiology    No results found.  Cardiac Studies     Patient Profile     66 y.o. 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 Karleen Hampshire.   Assessment & Plan    Orthostatic Hypotension  Falls  - Patient was started a chemotherapy trial with [facility] in 03/2022. For the past few weeks, patient has had multiple falls. Feels like he has "spaced out" during some fo the falls  - EEG showed mild diffuse  encephalopathy without seizure activity  - Patient has been very orthostatic here-- noted that orthostatic vital signs on 9/9 showed BP 112/72 lying and HR 73 lying, BP 81/61 standing and HR 117 standing  - Holding home BP medications  - Patient reports good PO intake  - Placed thigh-high compression stockings and abdominal binder  - Started midodrine 5 mg at 7 AM, noon, 4 PM but remained profoundly orthostatic  so Midodrine increased to 86m TID - Most recent echo from 11/2021 showed EF 55-60%.  - AM cortisol normal - orthostatic BPs better yesterday -Today after a long discussion yesterday about being compliant with compression hose and abdominal binder he had those both off today and said it was an issue with nursing staff.  The nurses says he is refusing to wear them.   -He is going to rehab today  CAD s/p CABG  - Not on antiplatelet due to need for xarelto  - Not on BB due to orthostatic hypotension   Chronic Anticoagulation  Paroxysmal Atrial Fibrillation  Recent PE  - Continue xarelto 20 mg daily   [facility] will sign off.   Medication Recommendations: ProAmatine 10 mg 3 times daily at 7 AM, 12 noon and 4 PM, Xarelto 20 mg daily Other recommendations (labs, testing, etc): Instructed to wear compression hose and abdominal binder from 7 AM until bedtime daily Follow up as an outpatient: Dr. VIrish Lackin 2 weeks     For questions or updates, please contact CL[place]stPlease consult [email or web address] for contact info under        Signed, TFransico Him MD  05/13/2022, 9:39 AM

## 2022-05-13 NOTE — TOC Transition Note (Signed)
Transition of Care [facility]) - CM/SW Discharge Note   Patient Details  Name: [name] MRN: [id number] Date of Birth: 1955/12/30  Transition of Care [facility]) CM/SW Contact:  Leeroy Cha, RN Phone Number: 05/13/2022, 9:54 AM   Clinical Narrative:    Patient has been accepted into cir . CIR does have a bed .  [facility]   Final next level of care: IP Rehab Facility Barriers to Discharge: Barriers Resolved   Patient Goals and CMS Choice Patient states their goals for this hospitalization and ongoing recovery are::  (Rehab) CMS Medicare.gov Compare Post Acute Care list provided to:: Patient Choice offered to / list presented to : Patient, Spouse  Discharge Placement                       Discharge Plan and Services   Discharge Planning Services: CM Consult Post Acute Care Choice: [place]                               Social Determinants of Health (SDOH) Interventions     Readmission Risk Interventions   Row Labels 05/09/2022   10:48 AM 03/17/2022   12:19 PM 08/21/2021    9:25 AM  Readmission Risk Prevention Plan   Section Header. No data exists in this row.     Transportation Screening   Complete Complete Complete  Medication Review Press photographer)   Complete Complete Complete  PCP or Specialist appointment within 3-5 days of discharge   Complete Complete Complete  HRI or Home Care Consult   Complete Complete Complete  SW Recovery Care/Counseling Consult   Complete Complete Complete  SW Consult Not Complete Comments     n/a  Palliative Care Screening   Not Applicable Complete Not Applicable  [place]   Complete Not Applicable Not Applicable

## 2022-05-14 DIAGNOSIS — I951 Orthostatic hypotension: Secondary | ICD-10-CM | POA: Diagnosis not present

## 2022-05-14 LAB — MAGNESIUM: Magnesium: 1.9 mg/dL (ref 1.7–2.4)

## 2022-05-14 LAB — COMPREHENSIVE METABOLIC PANEL
ALT: 12 U/L (ref 0–44)
AST: 20 U/L (ref 15–41)
Albumin: 2.8 g/dL — ABNORMAL LOW (ref 3.5–5.0)
Alkaline Phosphatase: 111 U/L (ref 38–126)
Anion gap: 8 (ref 5–15)
BUN: 11 mg/dL (ref 8–23)
CO2: 26 mmol/L (ref 22–32)
Calcium: 9.5 mg/dL (ref 8.9–10.3)
Chloride: 106 mmol/L (ref 98–111)
Creatinine, Ser: 0.8 mg/dL (ref 0.61–1.24)
GFR, Estimated: >60 mL/min (ref >60)
Glucose, Bld: 95 mg/dL (ref 70–99)
Potassium: 3.6 mmol/L (ref 3.5–5.1)
Sodium: 140 mmol/L (ref 135–145)
Total Bilirubin: 0.7 mg/dL (ref 0.3–1.2)
Total Protein: 6.6 g/dL (ref 6.5–8.1)

## 2022-05-14 LAB — CBC WITH DIFFERENTIAL/PLATELET
Abs Immature Granulocytes: 0.01 10*3/uL (ref 0.00–0.07)
Basophils Absolute: 0 10*3/uL (ref 0.0–0.1)
Basophils Relative: 1 %
Eosinophils Absolute: 0.3 10*3/uL (ref 0.0–0.5)
Eosinophils Relative: 8 %
HCT: 31.7 % — ABNORMAL LOW (ref 39.0–52.0)
Hemoglobin: 10.2 g/dL — ABNORMAL LOW (ref 13.0–17.0)
Immature Granulocytes: 0 %
Lymphocytes Relative: 20 %
Lymphs Abs: 0.8 10*3/uL (ref 0.7–4.0)
MCH: 30 pg (ref 26.0–34.0)
MCHC: 32.2 g/dL (ref 30.0–36.0)
MCV: 93.2 fL (ref 80.0–100.0)
Monocytes Absolute: 0.6 10*3/uL (ref 0.1–1.0)
Monocytes Relative: 16 %
Neutro Abs: 2.2 10*3/uL (ref 1.7–7.7)
Neutrophils Relative %: 55 %
Platelets: 197 10*3/uL (ref 150–400)
RBC: 3.4 MIL/uL — ABNORMAL LOW (ref 4.22–5.81)
RDW: 14 % (ref 11.5–15.5)
WBC: 3.9 10*3/uL — ABNORMAL LOW (ref 4.0–10.5)
nRBC: 0 % (ref 0.0–0.2)

## 2022-05-14 LAB — PHOSPHORUS: Phosphorus: 3.9 mg/dL (ref 2.5–4.6)

## 2022-05-14 LAB — VITAMIN D 25 HYDROXY (VIT D DEFICIENCY, FRACTURES): Vit D, 25-Hydroxy: 17.42 ng/mL — ABNORMAL LOW (ref 30–100)

## 2022-05-14 LAB — PREALBUMIN: Prealbumin: 10 mg/dL — ABNORMAL LOW (ref 18–38)

## 2022-05-14 MED ORDER — VITAMIN D (ERGOCALCIFEROL) 1.25 MG (50000 UNIT) PO CAPS
50000.0000 [IU] | ORAL_CAPSULE | ORAL | Status: DC
  Administered 2022-05-14 – 2022-05-21 (×2): 50000 [IU] via ORAL
  Filled 2022-05-14 (×2): qty 1

## 2022-05-14 MED ORDER — DICLOFENAC SODIUM 1 % EX GEL
2.0000 g | Freq: Four times a day (QID) | CUTANEOUS | Status: DC
  Administered 2022-05-14 – 2022-05-23 (×26): 2 g via TOPICAL
  Filled 2022-05-14: qty 100

## 2022-05-14 MED ORDER — HYDROMORPHONE HCL 1 MG/ML IJ SOLN: 1.0000 mg | Freq: Four times a day (QID) | INTRAMUSCULAR | Status: DC | PRN

## 2022-05-14 MED ORDER — MAGNESIUM GLUCONATE 500 MG PO TABS
250.0000 mg | ORAL_TABLET | Freq: Every day | ORAL | Status: DC
  Administered 2022-05-14 – 2022-05-23 (×10): 250 mg via ORAL
  Filled 2022-05-14 (×10): qty 1

## 2022-05-14 MED ORDER — CHLORHEXIDINE GLUCONATE CLOTH 2 % EX PADS
6.0000 | MEDICATED_PAD | Freq: Two times a day (BID) | CUTANEOUS | Status: DC
  Administered 2022-05-14 – 2022-05-24 (×17): 6 via TOPICAL

## 2022-05-14 MED ORDER — HYDROMORPHONE BOLUS VIA INFUSION: 1.0000 mg | Freq: Four times a day (QID) | INTRAVENOUS | Status: DC | PRN

## 2022-05-14 NOTE — Progress Notes (Addendum)
PROGRESS NOTE   Subjective/Complaints: [name] complains of right sided rib pain that has been present 10 months due to his cancer. He is agreeable to trying voltaren gel  ROS: +right sided rib pain   Objective:   No results found. Recent Labs    05/14/22 0335  WBC 3.9*  HGB 10.2*  HCT 31.7*  PLT 197   Recent Labs    05/14/22 0335  NA 140  K 3.6  CL 106  CO2 26  GLUCOSE 95  BUN 11  CREATININE 0.80  CALCIUM 9.5    Intake/Output Summary (Last 24 hours) at 05/14/2022 1040 Last data filed at 05/14/2022 0738 Gross per 24 hour  Intake 217 ml  Output --  Net 217 ml        Physical Exam: Vital Signs Blood pressure 125/82, pulse 79, temperature 98.3 F (36.8 C), temperature source Oral, resp. rate 17, height 5' 9.02" (1.753 m), weight 59.5 kg, SpO2 100 %. Gen: no distress, normal appearing HENT: No JVD. Neck Supple. Trachea midline. Atraumatic. +temporal wasting Eyes: PERRLA. EOMI. Visual fields grossly intact.  Cardiovascular: RRR, no murmurs/rub/gallops. No Edema. Peripheral pulses 2+  Respiratory: CTAB. No rales, rhonchi, or wheezing. On RA.  Abdomen: + bowel sounds, normoactive. No distention or tenderness. + abdominal binder GU: Not examined.  Skin: C/D/I. No apparent lesions. MSK:      No apparent deformity.      Strength:                RUE: 4/5 SA, 4/5 EF, 4/5 EE, 5/5 WE, 5/5 FF, 5/5 FA                 LUE: 5/5 SA, 5/5 EF, 5/5 EE, 5/5 WE, 5/5 FF, 5/5 FA                 RLE: 4/5 HF, 4/5 KE, 5/5 DF, 5/5 EHL, 5/5 PF                 LLE:  5/5 HF, 5/5 KE, 5/5 DF, 5/5 EHL, 5/5 PF    Neurologic exam:  Cognition: AAO to person, place, time and event. Cannot ad change, can perform serial 7s. Cannot spell world backwards. Language: Fluent, No substitutions or neoglisms. No dysarthria. Names 3/3 objects correctly.  Memory: Recalls 1/3 objects at 5 minutes.   Insight: Good insight into current condition.   Mood: Appropriate affect, depressed mood, tearful Sensation: To light touch intact in BL UEs and LEs with exception of right foot plantar, left 5th digit Reflexes: 2+ in BL UE and LEs. Negative Hoffman's and babinski signs bilaterally.  CN: 2-12 grossly intact.  Coordination: + BL UE intention tremors. No ataxia on FTN, HTS bilaterally.  Spasticity: MAS 0 in all extremities   Assessment/Plan: 1. Functional deficits which require 3+ hours per day of interdisciplinary therapy in a comprehensive inpatient rehab setting. Physiatrist is providing close team supervision and 24 hour management of active medical problems listed below. Physiatrist and rehab team continue to assess barriers to discharge/monitor patient progress toward functional and medical goals  Care Tool:  Bathing    Body parts bathed by patient: Right arm, Left  arm, Chest, Abdomen, Front perineal area, Buttocks, Right upper leg, Left upper leg, Face   Body parts bathed by helper: Right lower leg, Left lower leg     Bathing assist Assist Level: Minimal Assistance - Patient > 75%     Upper Body Dressing/Undressing Upper body dressing   What is the patient wearing?: Pull over shirt    Upper body assist Assist Level: Set up assist    Lower Body Dressing/Undressing Lower body dressing      What is the patient wearing?: Pants     Lower body assist Assist for lower body dressing: Minimal Assistance - Patient > 75%     Toileting Toileting    Toileting assist Assist for toileting: Moderate Assistance - Patient 50 - 74%     Transfers Chair/bed transfer  Transfers assist           Locomotion Ambulation   Ambulation assist              Walk 10 feet activity   Assist           Walk 50 feet activity   Assist           Walk 150 feet activity   Assist           Walk 10 feet on uneven surface  activity   Assist           Wheelchair     Assist                Wheelchair 50 feet with 2 turns activity    Assist            Wheelchair 150 feet activity     Assist          Blood pressure 125/82, pulse 79, temperature 98.3 F (36.8 C), temperature source Oral, resp. rate 17, height 5' 9.02" (1.753 m), weight 59.5 kg, SpO2 100 %.  Medical Problem List and Plan: 1. 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  Antithrombotics: -DVT/anticoagulation:  Pharmaceutical: Xarelto             -antiplatelet therapy: none   3. 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 Tylenol, Robaxin             - MS Contin 60 mg BID - home dose             - Dilaudid 4 mg q 4 hours as needed -> increased to home dose 4/8 mg Q4H PRN; monitor for worsening BP             - Pain meds managed by cancer center pain management; will need to inform them of any changes at discharge   4. Mood/Behavior/Sleep: LCSW to evaluate and provide emotional support             -antipsychotic agents: n/a             -depression: Cymbalta held due to hypotension; once improved, patient may benefit from change to Mirtazepine for depression, sleep, and appetite. Do appreciate symptoms of  anxiety may be more impactful. Will monitor for now.              -anxiety: hydroxyzine 10 mg PRN; Ativan held due to hypotension             - Added Trazadone 50 mg QHS PRN for insomnia             - Neuropsych consult placed given cognitive deficits, anxiety, and depression s/p rad/chemotherapy for brain mets - on schedule for Monday   5. Neuropsych/cognition: This patient is capable of making decisions on his own behalf.           - SLP consult placed    6. Skin/Wound Care: routine  skin care checks   7. Malnutrition (endorses 70+ lb unintentional weight loss in <1 year): routine Is and Os and follow-up chemistries         - Prealbumin low, discussed with patient, encouraged choosing high protein foods.     8: Large cell neuroendocrine carcinoma with brain, bone metastasis:             -clinical trial med held during admission   9: Orthostatic hypotension: continue midodrine 10 mg TID  -placed order for smaller abdominal binder.              - abdominal binder and TED only during the daytime - changed to when up to bedside or OOB due to discomfort.             - +/- Mestinon per cardiology; orthostatics improving, hold off for now             - no beta blockers 10: CAD s/p CABG 2017: Stable.  Currently off beta-blocker   11: Paroxysmal atrial fibrillation: continue Xarelto             -no BB due to significant orthostatic hypotension   12: Anemia, chronic: follow-up CBC  13. Screening for vitamin D deficiency: add vitamin D level to today's labs   14: Code status: DNR/DNI - following with palliative   LOS: 1 days A FACE TO FACE EVALUATION WAS [place] 05/14/2022, 10:40 AM

## 2022-05-14 NOTE — Evaluation (Addendum)
Speech Language Pathology Assessment and Plan  Patient Details  Name: GARDNER SERVANTES MRN: [id number] Date of Birth: September 09, 1955  SLP Diagnosis: Cognitive Impairments  Rehab Potential: Good ELOS: 7-10 days   Today's Date: 05/14/2022 SLP Individual Time: 1005-1100 SLP Individual Time Calculation (min): 32 min  Hospital Problem: Principal Problem:   Hypotension, postural  Past Medical History:  Past Medical History:  Diagnosis Date   Anemia    Anxiety    Arthritis    Basal cell carcinoma (BCC) of forehead    CAD (coronary artery disease)    a. 10/2015 ant STEMI >> LHC with 3 v CAD; oLAD tx with POBA >> emergent CABG. b. Multiple evals since that time, early graft failure of SVG-RCA by cath 03/2016. c. 2/19 PCI/DES x1 to pRCA, normal EF.   Carotid artery disease ([place])    a. 40-59% BICA 02/2018.   Depression    Dyspnea    Ectopic atrial tachycardia (HCC)    Esophageal reflux    eosinophil esophagitis   Family history of adverse reaction to anesthesia    "sister has PONV" (06/21/2017)   Former tobacco use    Gout    Hepatitis C    "treated and cured" (06/21/2017)   High cholesterol    History of blood transfusion    History of kidney stones    Hypertension    Ischemic cardiomyopathy    a. EF 25-30% at intraop TEE 4/17  //  b. Limited Echo 5/17 - EF 45-50%, mild ant HK. c. EF 55-65% by cath 09/2017.   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[place])    Migraine    "3-4/yr" (06/21/2017)   Myocardial infarction ([place]) 10/2015   Palpitations    Pneumonia    Sinus bradycardia    a. HR dropping into 40s in 02/2016 -> BB reduced.   Stroke [facility]) 10/2016   "small one; sometimes my memory/cognitive issues" (06/21/2017)   Symptomatic hypotension    a. 02/2016 ER visit -> meds reduced.   Syncope    Wears dentures    Wears glasses    Past Surgical History:  Past Surgical History:  Procedure Laterality Date   25 GAUGE PARS PLANA VITRECTOMY WITH 20 GAUGE MVR PORT  12/31/2020   Procedure: 25  GAUGE PARS PLANA VITRECTOMY WITH 20 GAUGE MVR PORT;  Surgeon: [name], MD;  [facility]: [facility];  Service: Thoracic;;   ANTERIOR CERVICAL DECOMP/DISCECTOMY FUSION N/A 10/17/2018   Procedure: Anterior Cervical Decompression Fusion - Cervical seven -Thoracic one;  Surgeon: Consuella Lose, MD;  Location: [place];  Service: Neurosurgery;  Laterality: N/A;   BASAL CELL CARCINOMA EXCISION     "forehead   BIOPSY  07/20/2019   Procedure: BIOPSY;  Surgeon: Carol Ada, MD;  Location: [facility];  Service: Endoscopy;;   BIOPSY OF MEDIASTINAL MASS Right 07/29/2021   Procedure: RIGHT CHEST WALL MASS BIOPSY;  Surgeon: [name], MD;  Location: [place];  Service: Thoracic;  Laterality: Right;   BRONCHIAL BIOPSY  12/24/2020   Procedure: BRONCHIAL BIOPSIES;  Surgeon: Garner Nash, DO;  Location: [place] [facility];  Service: Pulmonary;;   BRONCHIAL BRUSHINGS  12/24/2020   Procedure: BRONCHIAL BRUSHINGS;  Surgeon: Garner Nash, DO;  Location: [place];  Service: Pulmonary;;   BRONCHIAL NEEDLE ASPIRATION BIOPSY  12/24/2020   Procedure: BRONCHIAL NEEDLE ASPIRATION BIOPSIES;  Surgeon: Garner Nash, DO;  Location: [place];  Service: Pulmonary;;   CARDIAC CATHETERIZATION N/A 11/28/2015   Procedure: Left Heart Cath and Coronary Angiography;  Surgeon: [name], MD;  Location: [place] CV LAB;  Service: Cardiovascular;  Laterality: N/A;   CARDIAC CATHETERIZATION N/A 11/28/2015   Procedure: Coronary Balloon Angioplasty;  Surgeon: [name], MD;  Location: [place] CV LAB;  Service: Cardiovascular;  Laterality: N/A;  ostial LAD   CARDIAC CATHETERIZATION N/A 11/28/2015   Procedure: Coronary/Graft Angiography;  Surgeon: [name], MD;  Location: [place] CV LAB;  Service: Cardiovascular;  Laterality: N/A;  coronaries only    CARDIAC CATHETERIZATION N/A 04/21/2016   Procedure: Left Heart Cath and Coronary Angiography;  Surgeon: Wellington Hampshire, MD;   Location: [place] CV LAB;  Service: Cardiovascular;  Laterality: N/A;   CARDIAC CATHETERIZATION N/A 06/14/2016   Procedure: Left Heart Cath and Cors/Grafts Angiography;  Surgeon: Lorretta Harp, MD;  Location: [place] CV LAB;  Service: Cardiovascular;  Laterality: N/A;   CARDIAC CATHETERIZATION N/A 09/08/2016   Procedure: Left Heart Cath and Cors/Grafts Angiography;  Surgeon: Wellington Hampshire, MD;  Location: [place] CV LAB;  Service: Cardiovascular;  Laterality: N/A;   CARDIAC CATHETERIZATION     CORONARY ARTERY BYPASS GRAFT N/A 11/28/2015   Procedure: CORONARY ARTERY BYPASS GRAFTING (CABG) TIMES FIVE USING LEFT INTERNAL MAMMARY ARTERY AND RIGHT GREATER SAPHENOUS,VIEN HARVEATED BY ENDOVIEN, INTRAOPPRATIVE TEE;  Surgeon: Gaye Pollack, MD;  Location: [place];  Service: Open Heart Surgery;  Laterality: N/A;   CORONARY STENT INTERVENTION N/A 10/05/2017   Procedure: CORONARY STENT INTERVENTION;  Surgeon: [name], MD;  Location: [place] CV LAB;  Service: Cardiovascular;  Laterality: N/A;   ESOPHAGOGASTRODUODENOSCOPY (EGD) WITH PROPOFOL N/A 07/20/2019   Procedure: ESOPHAGOGASTRODUODENOSCOPY (EGD) WITH PROPOFOL;  Surgeon: Carol Ada, MD;  [facility];  Service: Endoscopy;  Laterality: N/A;   ESOPHAGOGASTRODUODENOSCOPY (EGD) WITH PROPOFOL N/A 01/30/2021   Procedure: ESOPHAGOGASTRODUODENOSCOPY (EGD) WITH PROPOFOL;  Surgeon: Carol Ada, MD;  [facility];  Service: Endoscopy;  Laterality: N/A;   FIDUCIAL MARKER PLACEMENT  12/24/2020   Procedure: FIDUCIAL DYE MARKER PLACEMENT;  Surgeon: Garner Nash, DO;  Location: [place] [facility];  Service: Pulmonary;;   HUMERUS SURGERY Right 1969   "tumor inside bone; filled it w/bone chips"   INTERCOSTAL NERVE BLOCK Right 12/24/2020   Procedure: INTERCOSTAL NERVE BLOCK;  Surgeon: [name], MD;  Location: [place];  Service: Thoracic;  Laterality: Right;   IR IMAGING GUIDED PORT INSERTION  02/06/2021   LEFT HEART  CATH AND CORS/GRAFTS ANGIOGRAPHY N/A 03/11/2017   Procedure: Left Heart Cath and Cors/Grafts Angiography;  Surgeon: Leonie Man, MD;  Location: [place] CV LAB;  Service: Cardiovascular;  Laterality: N/A;   LEFT HEART CATH AND CORS/GRAFTS ANGIOGRAPHY N/A 10/05/2017   Procedure: LEFT HEART CATH AND CORS/GRAFTS ANGIOGRAPHY;  Surgeon: [name], MD;  Location: [place] CV LAB;  Service: Cardiovascular;  Laterality: N/A;   LEFT HEART CATH AND CORS/GRAFTS ANGIOGRAPHY N/A 04/11/2019   Procedure: LEFT HEART CATH AND CORS/GRAFTS ANGIOGRAPHY;  Surgeon: [name], MD;  Location: [place] CV LAB;  Service: Cardiovascular;  Laterality: N/A;   NODE DISSECTION Right 12/24/2020   Procedure: NODE DISSECTION;  Surgeon: [name], MD;  Location: [place];  Service: Thoracic;  Laterality: Right;   PERIPHERAL VASCULAR CATHETERIZATION N/A 06/14/2016   Procedure: Lower Extremity Angiography;  Surgeon: Lorretta Harp, MD;  Location: [place] CV LAB;  Service: Cardiovascular;  Laterality: N/A;   VIDEO BRONCHOSCOPY WITH ENDOBRONCHIAL NAVIGATION Right 12/24/2020   Procedure: VIDEO BRONCHOSCOPY WITH ENDOBRONCHIAL NAVIGATION;  Surgeon: Valeta Harms,  Octavio Graves, DO;  Location: [place] [facility]COPY;  Service: Pulmonary;  Laterality: Right;   VIDEO BRONCHOSCOPY WITH ENDOBRONCHIAL ULTRASOUND N/A 12/24/2020   Procedure: VIDEO BRONCHOSCOPY WITH ENDOBRONCHIAL ULTRASOUND;  Surgeon: Garner Nash, DO;  Location: [place];  Service: Pulmonary;  Laterality: N/A;   VIDEO BRONCHOSCOPY WITH INSERTION OF INTERBRONCHIAL VALVE (IBV) N/A 12/31/2020   Procedure: VIDEO BRONCHOSCOPY WITH INSERTION OF INTERBRONCHIAL VALVE (IBV).VALVE IN CARTRIDGE 64mm,9mm. CHEST TUBE PLACEMENT.;  Surgeon: [name], MD;  [facility];  Service: Thoracic;  Laterality: N/A;   VIDEO BRONCHOSCOPY WITH INSERTION OF INTERBRONCHIAL VALVE (IBV) N/A 07/29/2021   Procedure: VIDEO BRONCHOSCOPY WITH REMOVAL OF INTERBRONCHIAL VALVE  (IBV);  Surgeon: [name], MD;  [facility] OR;  Service: Thoracic;  Laterality: N/A;    Assessment / Plan / Recommendation Clinical Impression Kodie Kishi is a 66 year old male with a history of large cell neuroendocrine carcinoma diagnosed in November 2022. He presented to the emergency department at [facility] on 05/03/2022 complaining of generalized weakness and multiple falls for approximately 3 weeks.  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 He is s/p whole brain radiation currently on experimental chemotherapy. Neurology was consulted and he underwent MRI of cervical and thoracic spine this admission. He is currently being aggressively treated for significant orthostatic hypotension and cardiology has been consulted. His past medical history significant for coronary artery disease status post emergent CABG in 2017.  He also has a history of paroxysmal atrial fibrillation maintained on Xarelto. The patient requires inpatient physical medicine and rehabilitation evaluations and treatment secondary to dysfunction due to metastatic large cell neuroendocrine carcinoma.  Pt seen for a cognitive-linguistic evaluation. Pt endorsed a gradual decline with cognition s/p cancer diagnosis with aggressive chemotherapy and radiation treatments, and reported exacerbation of attention and memory deficits s/p recent whole brain radiation. Pt expressed he often loses his "train of thought" and "spaces out" which was observed intermittently throughout assessment in a conversational context. With sup A verbal redirection, pt was able to return to topic without difficulty. Pt also supports increased anxiety which improves with prescribed medication. Pt has a supportive family who will be able to provide 24/7 assist at discharge. Pt reports uncertainty if experimental chemotherapy has been/will be successful, but plans to continue treatment per recommendations of cancer team and per prognosis.    The [facility] Mental Status Examination was completed to evaluate the pt's cognitive-linguistic skills. Pt achieved a score of 25/30 which is below the normal limits of 27 or more out of 30 and is suggestive of a mild impairment. Pt exhibited difficulty primarily in the areas of sustained attention and short-term recall. Awareness, problem solving, executive functions appear intact. With orientation, pt was off by one month, stating it is currently October. Suspect pt is near cognitive baseline d/t report of gradual decline since onset of cancer treatment. Pt demonstrated awareness of deficits and very kindly requested for verbal repetition/clarification as needed. Expressive/receptive language appear WFL. Speech was 100% intelligible at the conversational level. Pt denied chewing/swallowing difficulty.  Pt would benefit from short-term SLP services to educate on strategies to support improved memory and attention to maximize functional independence. Pt verbalized understanding and agreement with plan. Don't anticipate follow up services will be clinically indicated at discharge with ongoing aggressive cancer treatment, however may be beneficial in future if further decline occurs to maximize functional independence and safety within home environment pending pt's needs at the time and wishes.     Skilled  Therapeutic Interventions          Pt participated in [place] Status Examination (SLUMS) as well as further non-standardized assessments of cognitive-linguistic, speech, and language function. Please see above.   SLP Assessment  Patient will need skilled [place] [facility]ology Services during CIR admission    Recommendations  Recommendations for Other Services: Neuropsych consult (anxiety) Patient destination: Home Follow up Recommendations: None Equipment Recommended: To be determined    SLP Frequency 1 to 3 out of 7 days   SLP Duration  SLP  Intensity  SLP Treatment/Interventions 7-10 days  Minumum of 1-2 x/day, 30 to 90 minutes  Cognitive remediation/compensation;Internal/external aids;Patient/family education    Pain    Prior Functioning Cognitive/Linguistic Baseline: Within functional limits (pt reports gradual cognitive changes s/p radiation) Type of Home: House (Two story home, but he can remain on the first level)  Lives With: Spouse Available Help at Discharge: Family;Available 24 hours/day Education: Dietitian and grad school although did not graduate Vocation: Part time employment  SLP Evaluation Cognition Overall Cognitive Status: Impaired/Different from baseline Arousal/Alertness: Awake/alert Orientation Level: Oriented to person;Oriented to place;Oriented to situation;Other (comment) (error for month) Year: 2023 Month: October Day of Week: Correct Attention: Sustained Sustained Attention: Impaired Sustained Attention Impairment: Verbal complex Memory: Impaired Memory Impairment: Decreased recall of new information;Decreased short term memory Decreased Short Term Memory: Verbal complex Awareness: Appears intact Problem Solving: Appears intact Safety/Judgment: Appears intact  Comprehension Auditory Comprehension Overall Auditory Comprehension: Appears within functional limits for tasks assessed EffectiveTechniques: Repetition Expression Expression Primary Mode of Expression: Verbal Verbal Expression Overall Verbal Expression: Appears within functional limits for tasks assessed (Pt exhibited questionable word finding difficulty however suspect attributed to working memory and sustained attention deficits.) Oral Motor Oral Motor/Sensory Function Overall Oral Motor/Sensory Function: Within functional limits Motor Speech Overall Motor Speech: Appears within functional limits for tasks assessed Phonation: Normal Resonance: Within functional limits Articulation: Within functional  limitis Intelligibility: Intelligible Motor Planning: [place] functional limits  Care Tool Care Tool Cognition Ability to hear (with hearing aid or hearing appliances if normally used Ability to hear (with hearing aid or hearing appliances if normally used): 0. Adequate - no difficulty in normal conservation, social interaction, listening to TV   Expression of Ideas and Wants Expression of Ideas and Wants: 3. Some difficulty - exhibits some difficulty with expressing needs and ideas (e.g, some words or finishing thoughts) or speech is not clear   Understanding Verbal and Non-Verbal Content Understanding Verbal and Non-Verbal Content: 3. Usually understands - understands most conversations, but misses some part/intent of message. Requires cues at times to understand  Memory/Recall Ability Memory/Recall Ability : Current season;That he or she is in a hospital/hospital unit   PMSV Assessment  PMSV Trial Intelligibility: Intelligible  Short Term Goals: Week 1: SLP Short Term Goal 1 (Week 1): STG=LTG due to ELOS  Refer to Care Plan for Long Term Goals  Recommendations for other services: Neuropsych  Discharge Criteria: Patient will be discharged from SLP if patient refuses treatment 3 consecutive times without medical reason, if treatment goals not met, if there is a change in medical status, if patient makes no progress towards goals or if patient is discharged from hospital.  The above assessment, treatment plan, treatment alternatives and goals were discussed and mutually agreed upon: by patient  Patty Sermons 05/14/2022, 12:58 PM

## 2022-05-14 NOTE — Progress Notes (Signed)
Inpatient Rehabilitation  Patient information reviewed and entered into eRehab system by Deserae Jennings M. Alaira Level, M.A., CCC/SLP, PPS Coordinator.  Information including medical coding, functional ability and quality indicators will be reviewed and updated through discharge.    

## 2022-05-14 NOTE — Evaluation (Addendum)
Physical Therapy Assessment and Plan  Patient Details  Name: [name] MRN: [id number] Date of Birth: [birth year]  PT Diagnosis: Abnormal posture, Abnormality of gait, Cognitive deficits, Difficulty walking, Dizziness and giddiness, and Muscle weakness Rehab Potential: Good ELOS: 7-10   Today's Date: 05/14/2022 PT Individual Time: 1100-1210 PT Individual Time Calculation (min): 70 min    Hospital Problem: Principal Problem:   Hypotension, postural   Past Medical History:  Past Medical History:  Diagnosis Date   Anemia    Anxiety    Arthritis    Basal cell carcinoma (BCC) of forehead    CAD (coronary artery disease)    a. 10/2015 ant STEMI >> LHC with 3 v CAD; oLAD tx with POBA >> emergent CABG. b. Multiple evals since that time, early graft failure of SVG-RCA by cath 03/2016. c. 2/19 PCI/DES x1 to pRCA, normal EF.   Carotid artery disease ([place] II)    a. 40-59% BICA 02/2018.   Depression    Dyspnea    Ectopic atrial tachycardia (HCC)    Esophageal reflux    eosinophil esophagitis   Family history of adverse reaction to anesthesia    "sister has PONV" (06/21/2017)   Former tobacco use    Gout    Hepatitis C    "treated and cured" (06/21/2017)   High cholesterol    History of blood transfusion    History of kidney stones    Hypertension    Ischemic cardiomyopathy    a. EF 25-30% at intraop TEE 4/17  //  b. Limited Echo 5/17 - EF 45-50%, mild ant HK. c. EF 55-65% by cath 09/2017.   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 HR dropping into 40s in 02/2016 -> BB reduced.   Stroke [facility]) 10/2016   "small one; sometimes my memory/cognitive issues" (06/21/2017)   Symptomatic hypotension    a. 02/2016 ER visit -> meds reduced.   Syncope    Wears dentures    Wears glasses    Past Surgical History:  Past Surgical History:  Procedure Laterality  Date   25 GAUGE PARS PLANA VITRECTOMY WITH 20 GAUGE MVR PORT  12/31/2020   Procedure: 25 GAUGE PARS PLANA VITRECTOMY WITH 20 GAUGE MVR PORT;  Surgeon: [name], MD;  [facility]: [facility];  Service: Thoracic;;   ANTERIOR CERVICAL DECOMP/DISCECTOMY FUSION N/A 10/17/2018   Procedure: Anterior Cervical Decompression Fusion - Cervical seven -Thoracic one;  Surgeon: Consuella Lose, MD;  Location: [place];  Service: Neurosurgery;  Laterality: N/A;   BASAL CELL CARCINOMA EXCISION     "forehead   BIOPSY  07/20/2019   Procedure: BIOPSY;  Surgeon: Carol Ada, MD;  [facility]: [facility];  Service: Endoscopy;;   BIOPSY OF MEDIASTINAL MASS Right 07/29/2021   Procedure: RIGHT CHEST WALL MASS BIOPSY;  Surgeon: [name], MD;  Location: [place];  Service: Thoracic;  Laterality: Right;   BRONCHIAL BIOPSY  12/24/2020   Procedure: BRONCHIAL BIOPSIES;  Surgeon: Garner Nash, DO;  Location: [place] [facility];  Service: Pulmonary;;   BRONCHIAL BRUSHINGS  12/24/2020   Procedure: BRONCHIAL BRUSHINGS;  Surgeon: Garner Nash, DO;  Location: [place] [facility];  Service: Pulmonary;;   BRONCHIAL NEEDLE ASPIRATION BIOPSY  12/24/2020   Procedure: BRONCHIAL NEEDLE ASPIRATION BIOPSIES;  Surgeon: Garner Nash, DO;  Location: [place];  Service: Pulmonary;;  CARDIAC CATHETERIZATION N/A 11/28/2015   Procedure: Left Heart Cath and Coronary Angiography;  Surgeon: [name], MD;  Location: [place] [facility];  Service: Cardiovascular;  Laterality: N/A;   CARDIAC CATHETERIZATION N/A 11/28/2015   Procedure: Coronary Balloon Angioplasty;  Surgeon: [name], MD;  Location: [place] [facility];  Service: Cardiovascular;  Laterality: N/A;  ostial LAD   CARDIAC CATHETERIZATION N/A 11/28/2015   Procedure: Coronary/Graft Angiography;  Surgeon: [name], MD;  Location: [place] [facility];  Service: Cardiovascular;  Laterality: N/A;  coronaries only    CARDIAC CATHETERIZATION N/A 04/21/2016    Procedure: Left Heart Cath and Coronary Angiography;  Surgeon: Wellington Hampshire, MD;  Location: [place] [facility];  Service: Cardiovascular;  Laterality: N/A;   CARDIAC CATHETERIZATION N/A 06/14/2016   Procedure: Left Heart Cath and Cors/Grafts Angiography;  Surgeon: Lorretta Harp, MD;  Location: [place] [facility];  Service: Cardiovascular;  Laterality: N/A;   CARDIAC CATHETERIZATION N/A 09/08/2016   Procedure: Left Heart Cath and Cors/Grafts Angiography;  Surgeon: Wellington Hampshire, MD;  Location: [place] [facility];  Service: Cardiovascular;  Laterality: N/A;   CARDIAC CATHETERIZATION     CORONARY ARTERY BYPASS GRAFT N/A 11/28/2015   Procedure: CORONARY ARTERY BYPASS GRAFTING (CABG) TIMES FIVE USING LEFT INTERNAL MAMMARY ARTERY AND RIGHT GREATER SAPHENOUS,VIEN HARVEATED BY ENDOVIEN, INTRAOPPRATIVE TEE;  Surgeon: Gaye Pollack, MD;  Location: [place];  Service: Open Heart Surgery;  Laterality: N/A;   CORONARY STENT INTERVENTION N/A 10/05/2017   Procedure: CORONARY STENT INTERVENTION;  Surgeon: [name], MD;  Location: [place] [facility];  Service: Cardiovascular;  Laterality: N/A;   ESOPHAGOGASTRODUODENOSCOPY (EGD) WITH PROPOFOL N/A 07/20/2019   Procedure: ESOPHAGOGASTRODUODENOSCOPY (EGD) WITH PROPOFOL;  Surgeon: Carol Ada, MD;  [facility];  Service: Endoscopy;  Laterality: N/A;   ESOPHAGOGASTRODUODENOSCOPY (EGD) WITH PROPOFOL N/A 01/30/2021   Procedure: ESOPHAGOGASTRODUODENOSCOPY (EGD) WITH PROPOFOL;  Surgeon: Carol Ada, MD;  [facility];  Service: Endoscopy;  Laterality: N/A;   FIDUCIAL MARKER PLACEMENT  12/24/2020   Procedure: FIDUCIAL DYE MARKER PLACEMENT;  Surgeon: Garner Nash, DO;  Location: [place] [facility];  Service: Pulmonary;;   HUMERUS SURGERY Right 1969   "tumor inside bone; filled it w/bone chips"   INTERCOSTAL NERVE BLOCK Right 12/24/2020   Procedure: INTERCOSTAL NERVE BLOCK;  Surgeon: [name], MD;  Location: [place];  Service:  Thoracic;  Laterality: Right;   IR IMAGING GUIDED PORT INSERTION  02/06/2021   LEFT HEART CATH AND CORS/GRAFTS ANGIOGRAPHY N/A 03/11/2017   Procedure: Left Heart Cath and Cors/Grafts Angiography;  Surgeon: Leonie Man, MD;  Location: [place] [facility];  Service: Cardiovascular;  Laterality: N/A;   LEFT HEART CATH AND CORS/GRAFTS ANGIOGRAPHY N/A 10/05/2017   Procedure: LEFT HEART CATH AND CORS/GRAFTS ANGIOGRAPHY;  Surgeon: [name], MD;  Location: [place] [facility];  Service: Cardiovascular;  Laterality: N/A;   LEFT HEART CATH AND CORS/GRAFTS ANGIOGRAPHY N/A 04/11/2019   Procedure: LEFT HEART CATH AND CORS/GRAFTS ANGIOGRAPHY;  Surgeon: [name], MD;  Location: [place] [facility];  Service: Cardiovascular;  Laterality: N/A;   NODE DISSECTION Right 12/24/2020   Procedure: NODE DISSECTION;  Surgeon: [name], MD;  Location: [place];  Service: Thoracic;  Laterality: Right;   PERIPHERAL VASCULAR CATHETERIZATION N/A 06/14/2016   Procedure: Lower Extremity Angiography;  Surgeon: Lorretta Harp, MD;  Location: [place] [facility];  Service: Cardiovascular;  Laterality: N/A;   VIDEO BRONCHOSCOPY WITH ENDOBRONCHIAL  NAVIGATION Right 12/24/2020   Procedure: VIDEO BRONCHOSCOPY WITH ENDOBRONCHIAL NAVIGATION;  Surgeon: Garner Nash, DO;  Location: [place];  Service: Pulmonary;  Laterality: Right;   VIDEO BRONCHOSCOPY WITH ENDOBRONCHIAL ULTRASOUND N/A 12/24/2020   Procedure: VIDEO BRONCHOSCOPY WITH ENDOBRONCHIAL ULTRASOUND;  Surgeon: Garner Nash, DO;  Location: [place];  Service: Pulmonary;  Laterality: N/A;   VIDEO BRONCHOSCOPY WITH INSERTION OF INTERBRONCHIAL VALVE (IBV) N/A 12/31/2020   Procedure: VIDEO BRONCHOSCOPY WITH INSERTION OF INTERBRONCHIAL VALVE (IBV).VALVE IN CARTRIDGE 71mm,9mm. 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  He presented to the emergency department at [facility] on 05/03/2022 complaining of generalized weakness and multiple falls for approximately 3 weeks.  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  He is s/p whole brain radiation currently on experimental chemotherapy. He is enrolled in clinical trial at [facility] and the experimental drug alirocumab is typically held during hospital admission according to trial study coordinator.  Neurology was consulted and he underwent MRI of cervical and thoracic spine this admission.  He is currently being aggressively treated for significant orthostatic hypotension and cardiology has been consulted.  Multiple medications have been held and he is on midodrine 3 times daily.  This has improved however he he still tachycardic to 140s when ambulating. His past medical history significant for coronary artery disease status post emergent CABG in 2017.  He also has a history of paroxysmal atrial fibrillation maintained on Xarelto.  Current ejection fraction is estimated at 55 to 60%. The patient requires inpatient physical medicine and rehabilitation evaluations and treatment secondary to dysfunction due to metastatic large cell neuroendocrine carcinoma. Patient transferred to CIR on 05/13/2022 .   Patient currently requires min with mobility secondary to muscle weakness, decreased cardiorespiratoy endurance, impaired timing and sequencing and decreased coordination, decreased memory, and decreased standing balance and decreased balance strategies.  Prior to hospitalization, patient was independent  with mobility and lived with Spouse  in a House (Two story home, but he can remain on the first level) home.  Home access is 6Stairs to enter.  Patient will benefit from skilled PT intervention to maximize safe functional mobility, minimize fall risk, and decrease caregiver burden for planned discharge home with intermittent assist.  Anticipate patient will benefit from follow up OP at discharge.  PT - End of Session Activity Tolerance: Tolerates 30+ min activity with multiple rests Endurance Deficit: Yes Endurance Deficit Description: Global deconditioning and poor activity tolerance; orthostatic and tachycardic with stance position PT Assessment Rehab Potential (ACUTE/IP ONLY): Good PT Barriers to Discharge: Home environment access/layout;Pending chemo/radiation PT Patient demonstrates impairments in the following area(s): Balance;Pain;Endurance;Sensory;Motor PT Transfers Functional Problem(s): Bed Mobility;Bed to Chair;Car PT Locomotion Functional Problem(s): Ambulation;Stairs PT Plan PT Intensity: Minimum of 1-2 x/day ,45 to 90 minutes PT Frequency: 5 out of 7 days PT Duration Estimated Length of Stay: 7-10 PT Treatment/Interventions: Ambulation/gait training;Cognitive remediation/compensation;DME/adaptive equipment instruction;Discharge planning;Functional mobility training;Pain management;Psychosocial support;Therapeutic Activities;Splinting/orthotics;UE/LE Strength taining/ROM;Visual/perceptual remediation/compensation;Balance/vestibular training;Community reintegration;Disease management/prevention;Patient/family education;Neuromuscular re-education;Stair training;Therapeutic Exercise;UE/LE Coordination activities PT Transfers Anticipated Outcome(s): ModI with RW PT Locomotion  Anticipated Outcome(s): ModI with RW- 150' PT Recommendation: Discharge Home Recommendations for Other Services: Neuropsych consult Follow Up Recommendations: Outpatient PT (Versus HHC pending family/transportation availability) Equipment  Recommended: To be determined Equipment Details: Patient already owns a RW   PT Evaluation Precautions/Restrictions Precautions Precautions: Fall Precaution Comments: orthostatic, monitor HR Other Brace: Has abdominal binder and TEDs Restrictions Weight Bearing Restrictions: No General   Vital Signs  Pain   Pain Interference Pain Interference Pain Effect on Sleep: 3. Frequently Pain Interference with Therapy Activities: 1. Rarely or not at all Pain Interference with Day-to-Day Activities: 2. Occasionally Home Living/Prior Functioning Home Living Living Arrangements: Spouse/significant other Available Help at Discharge: Family;Available 24 hours/day Type of Home: House (Two story home, but he can remain on the first level) Home Access: Stairs to enter [employer] of Steps: 6 Entrance Stairs-Rails: Right;Left ("Not practical to be able to reach both handrails") Home Layout: Two level;Bed/bath upstairs;1/2 bath on main level Alternate Level Stairs-Number of Steps: 13 Alternate Level Stairs-Rails: Can reach both Bathroom Shower/Tub: Tub/shower unit;Walk-in shower Bathroom Toilet: Standard Bathroom Accessibility: No (RW will not fit through the door on the first floor) Additional Comments: Patient reports recently sleeping on the couch downstairs and staying downstairs; 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 [employer] more on the business side of the company versus physical  labor. Leisure: Hobbies-yes (Comment) ("Work was fun to me") Vision/Perception  Vision - History Ability to See in Adequate Light: 0 Adequate Vision - Assessment Eye Alignment: Within Functional Limits Ocular Range of Motion: Within Functional Limits Tracking/Visual Pursuits: Decreased smoothness of horizontal tracking (More notable when tracking to the R vs L) Saccades: Decreased speed of saccadic movement Perception Perception: Within Functional Limits Praxis Praxis: Intact  Cognition Overall Cognitive Status: Within Functional Limits for tasks assessed Arousal/Alertness: Awake/alert Orientation Level: Oriented X4 Year: 2023 Month: September Day of Week: Correct Memory: Appears intact Awareness: Appears intact Problem Solving: Appears intact Safety/Judgment: Appears intact Sensation Sensation Light Touch: Impaired by gross assessment Hot/Cold: Appears Intact Proprioception: Appears Intact Additional Comments: Patient reports numbness in L hand and L foot; Reports tremors in L hand have improved significantly since admission. Coordination Gross Motor Movements are Fluid and Coordinated: No Fine Motor Movements are Fluid and Coordinated: No Motor  Motor Motor: Ataxia;Abnormal postural alignment and control Motor - Skilled Clinical Observations: Patient presents with impaired L LE coordination especially during swing phase of gait.   Trunk/Postural Assessment  Cervical Assessment Cervical Assessment: Within Functional Limits Thoracic Assessment Thoracic Assessment: Exceptions to [facility] (In standing, patient presents with L shoulder elevation and R should despression and lateral trunk lean) Lumbar Assessment Lumbar Assessment: Exceptions to [facility] (R hip hike with shortened lumbar on R side and elongated on L) Postural Control Postural Control: Within Functional Limits  Balance Balance Balance Assessed: Yes Static Sitting Balance Static Sitting - Balance Support: Feet  supported Static Sitting - Level of Assistance: 5: Stand by assistance Dynamic Sitting Balance Dynamic Sitting - Balance Support: Feet supported Dynamic Sitting - Level of Assistance: 5: Stand by assistance Static Standing Balance Static Standing - Balance Support: Bilateral upper  extremity supported;During functional activity Static Standing - Level of Assistance:  (CGA) Dynamic Standing Balance Dynamic Standing - Balance Support: Bilateral upper extremity supported;During functional activity Dynamic Standing - Level of Assistance: 4: Min assist Extremity Assessment      RLE Assessment RLE Assessment: Exceptions to [facility] General Strength Comments: R LE strength assessed with functional mobility- R LE grossly 3+/5 LLE Assessment LLE Assessment: Exceptions to [facility] General Strength Comments: L LE strength assessed with functional mobility- L LE grossly 3+/5; Coordination deficits noted  Care Tool Care Tool Bed Mobility Roll left and right activity   Roll left and right assist level: Independent    Sit to lying activity   Sit to lying assist level: Independent    Lying to sitting on side of bed activity   Lying to sitting on side of bed assist level: the ability to move from lying on the back to sitting on the side of the bed with no back support.: Independent     Care Tool Transfers Sit to stand transfer   Sit to stand assist level: Minimal Assistance - Patient > 75%    Chair/bed transfer   Chair/bed transfer assist level: Minimal Assistance - Patient > 75%     Toilet transfer   Assist Level: Minimal Assistance - Patient > 75%    Car transfer   Car transfer assist level: Minimal Assistance - Patient > 75%      Care Tool Locomotion Ambulation   Assist level: Minimal Assistance - Patient > 75% Assistive device: Walker-rolling Max distance: 150  Walk 10 feet activity   Assist level: Minimal Assistance - Patient > 75% Assistive device: Walker-rolling   Walk 50 feet with  2 turns activity   Assist level: Minimal Assistance - Patient > 75% Assistive device: Walker-rolling  Walk 150 feet activity   Assist level: Minimal Assistance - Patient > 75% Assistive device: Walker-rolling  Walk 10 feet on uneven surfaces activity   Assist level: Minimal Assistance - Patient > 75% Assistive device: Walker-rolling  Stairs   Assist level: Minimal Assistance - Patient > 75% Stairs assistive device: 1 hand rail Max number of stairs: 12  Walk up/down 1 step activity   Walk up/down 1 step (curb) assist level: Minimal Assistance - Patient > 75% Walk up/down 1 step or curb assistive device: 1 hand rail  Walk up/down 4 steps activity   Walk up/down 4 steps assist level: Minimal Assistance - Patient > 75% Walk up/down 4 steps assistive device: 1 hand rail  Walk up/down 12 steps activity   Walk up/down 12 steps assist level: Minimal Assistance - Patient > 75% Walk up/down 12 steps assistive device: 1 hand rail  Pick up small objects from floor   Pick up small object from the floor assist level: Minimal Assistance - Patient > 75% Pick up small object from the floor assistive device: RW  Wheelchair Is the patient using a wheelchair?: No          Wheel 50 feet with 2 turns activity      Wheel 150 feet activity        Refer to Care Plan for Long Term Goals  SHORT TERM GOAL WEEK 1 PT Short Term Goal 1 (Week 1): STG=LTG secondary to ELOS  Recommendations for other services: Neuropsych  Skilled Therapeutic Intervention Mobility Bed Mobility Bed Mobility: Rolling Right;Rolling Left;Supine to Sit;Sit to Supine Rolling Right: Independent Rolling Left: Independent Supine to Sit: Independent Sit to Supine: Independent Transfers Transfers: Sit to Stand;Stand  to Sit;Stand Pivot Transfers Sit to Stand: Minimal Assistance - Patient > 75% Stand to Sit: Minimal Assistance - Patient > 75% Stand Pivot Transfers: Minimal Assistance - Patient > 75% Stand Pivot Transfer  Details: Verbal cues for technique;Verbal cues for safe use of DME/AE;Verbal cues for sequencing Transfer (Assistive device): Rolling walker Locomotion  Gait Ambulation: Yes Gait Assistance: Minimal Assistance - Patient > 75% Gait Distance (Feet): 150 Feet Assistive device: Rolling walker Gait Assistance Details: Verbal cues for technique;Verbal cues for gait pattern;Verbal cues for safe use of DME/AE Gait Gait: Yes Gait Pattern: Narrow base of support;Scissoring Gait velocity: Decreased Stairs / Additional Locomotion Stairs: Yes Stairs Assistance: Minimal Assistance - Patient > 75% Stair Management Technique: One rail Right Number of Stairs: 12 Height of Stairs: 6 Ramp: Minimal Assistance - Patient >75% Curb: Minimal Assistance - Patient >75% Wheelchair Mobility Wheelchair Mobility: No  Skilled Intervention- Patient greeted reclined in bedside recliner and agreeable to PT treatment session. Patient already had abdominal binder and TED hose donned prior to start of session. Patient required CGA/MinA with the use of a RW with all functional mobility tasks. At times, patient reported feeling "woozy and light-headed." BP was taken after initial gait trial from his room to ortho gym and was 106/81 and HR 103- Patient reports this is low compared to his baseline. Patient was able to ambulate from ortho gym to/from his room and main rehab gym with RW and no wc follow. Patient required extended seated rest break after all functional mobility secondary to impaired endurance/activity tolerance. VC throughout for proper use of RW and hand placement prior to sitting/standing with inconsistent carryover noted. Patient did not demonstrate any LOB throughout gait trials, however did demonstrate scissoring and impaired coordination of L LE leading to narrow BOS. Patient expressed concern for ambulation without RW since he has has "a lot of falls at home" recently. Patient educated on using the RW for  functional mobility especially when radiation/chemo starts up again- Patient agreed. Patient returned to his room reclined in bedside recliner with call bell within reach, family present and all needs met.   Discharge Criteria: Patient will be discharged from PT if patient refuses treatment 3 consecutive times without medical reason, if treatment goals not met, if there is a change in medical status, if patient makes no progress towards goals or if patient is discharged from hospital.  The above assessment, treatment plan, treatment alternatives and goals were discussed and mutually agreed upon: by patient  [place] 05/14/2022, 12:33 PM

## 2022-05-14 NOTE — Progress Notes (Signed)
Have had multiple reports as well as requests that patient wants IV dilaudid. Record reviewed and discussed with Dr. Adam Phenix who had discussed jpain management w/patient and increased his dilaudid to home dose. She did add dilaudid IV prn for now and plans to discuss w/patient that this will not be Rx at discharge and palliative care consulted for input on pain management.

## 2022-05-14 NOTE — Plan of Care (Signed)
  Problem: RH Memory Goal: LTG Patient will use memory compensatory aids to (SLP) Description: LTG:  Patient will use memory compensatory aids to recall biographical/new, daily complex information with cues (SLP) Flowsheets (Taken 05/14/2022 1258) LTG: Patient will use memory compensatory aids to (SLP):  Supervision  Modified Independent   Problem: RH Attention Goal: LTG Patient will demonstrate this level of attention during functional activites (SLP) Description: LTG:  Patient will will demonstrate this level of attention during functional activites (SLP) Flowsheets (Taken 05/14/2022 1258) Patient will demonstrate during cognitive/linguistic activities the attention type of: Sustained Patient will demonstrate this level of attention during cognitive/linguistic activities in: Controlled LTG: Patient will demonstrate this level of attention during cognitive/linguistic activities with assistance of (SLP): Supervision

## 2022-05-14 NOTE — Plan of Care (Signed)
  Problem: Sit to Stand Goal: LTG:  Patient will perform sit to stand with assistance level (PT) Description: LTG:  Patient will perform sit to stand with assistance level (PT) Flowsheets (Taken 05/14/2022 1246) LTG: PT will perform sit to stand in preparation for functional mobility with assistance level: Independent with assistive device   Problem: RH Bed Mobility Goal: LTG Patient will perform bed mobility with assist (PT) Description: LTG: Patient will perform bed mobility with assistance, with/without cues (PT). Flowsheets (Taken 05/14/2022 1246) LTG: Pt will perform bed mobility with assistance level of: Independent   Problem: RH Bed to Chair Transfers Goal: LTG Patient will perform bed/chair transfers w/assist (PT) Description: LTG: Patient will perform bed to chair transfers with assistance (PT). Flowsheets (Taken 05/14/2022 1246) LTG: Pt will perform Bed to Chair Transfers with assistance level: Independent with assistive device    Problem: RH Car Transfers Goal: LTG Patient will perform car transfers with assist (PT) Description: LTG: Patient will perform car transfers with assistance (PT). Flowsheets (Taken 05/14/2022 1246) LTG: Pt will perform car transfers with assist:: Independent with assistive device    Problem: RH Ambulation Goal: LTG Patient will ambulate in home environment (PT) Description: LTG: Patient will ambulate in home environment, # of feet with assistance (PT). Flowsheets (Taken 05/14/2022 1246) LTG: Pt will ambulate in home environ  assist needed:: Independent with assistive device LTG: Ambulation distance in home environment: 50' Goal: LTG Patient will ambulate in community environment (PT) Description: LTG: Patient will ambulate in community environment, # of feet with assistance (PT). Flowsheets (Taken 05/14/2022 1246) LTG: Pt will ambulate in community environ  assist needed:: Independent with assistive device LTG: Ambulation distance in community  environment: 150'   Problem: RH Stairs Goal: LTG Patient will ambulate up and down stairs w/assist (PT) Description: LTG: Patient will ambulate up and down # of stairs with assistance (PT) Flowsheets (Taken 05/14/2022 1246) LTG: Pt will ambulate up/down stairs assist needed:: Independent with assistive device LTG: Pt will  ambulate up and down number of stairs: 12

## 2022-05-14 NOTE — Progress Notes (Signed)
[facility] Individual Statement of Services  Patient Name:  [name]  Date:  05/14/2022  Welcome to the [facility].  Our goal is to provide you with an individualized program based on your diagnosis and situation, designed to meet your specific needs.  With this comprehensive rehabilitation program, you will be expected to participate in at least 3 hours of rehabilitation therapies Monday-Friday, with modified therapy programming on the weekends.  Your rehabilitation program will include the following services:  Physical Therapy (PT), Occupational Therapy (OT), Speech Therapy (ST), 24 hour per day rehabilitation nursing, Therapeutic Recreaction (TR), Neuropsychology, Care Coordinator, Rehabilitation Medicine, Nutrition Services, Pharmacy Services, and Other  Weekly team conferences will be held on Wednesdays to discuss your progress.  Your Inpatient Rehabilitation Care Coordinator will talk with you frequently to get your input and to update you on team discussions.  Team conferences with you and your family in attendance may also be held.  Expected length of stay: 9-11 Days  Overall anticipated outcome:  Supervision  Depending on your progress and recovery, your program may change. Your Inpatient Rehabilitation Care Coordinator will coordinate services and will keep you informed of any changes. Your Inpatient Rehabilitation Care Coordinator's name and contact numbers are listed  below.  The following services may also be recommended but are not provided by the [facility]:   [place] will be made to provide these services after discharge if needed.  Arrangements include referral to agencies that provide these services.  Your insurance has been verified to be:  Medicare A & B Your primary doctor is:  [name], MD  Pertinent information  will be shared with your doctor and your insurance company.  Inpatient Rehabilitation Care Coordinator:  Erlene Quan, [place] or [phone]  Information discussed with and copy given to patient by: Dyanne Iha, 05/14/2022, 10:25 AM

## 2022-05-14 NOTE — Progress Notes (Signed)
Inpatient Rehabilitation Care Coordinator Assessment and Plan Patient Details  Name: [name] MRN: [id number] Date of Birth: 1956/07/17  Today's Date: 05/14/2022  Hospital Problems: Principal Problem:   Hypotension, postural  Past Medical History:  Past Medical History:  Diagnosis Date   Anemia    Anxiety    Arthritis    Basal cell carcinoma (BCC) of forehead    CAD (coronary artery disease)    a. 10/2015 ant STEMI >> LHC with 3 v CAD; oLAD tx with POBA >> emergent CABG. b. Multiple evals since that time, early graft failure of SVG-RCA by cath 03/2016. c. 2/19 PCI/DES x1 to pRCA, normal EF.   Carotid artery disease ([place])    a. 40-59% BICA 02/2018.   Depression    Dyspnea    Ectopic atrial tachycardia (HCC)    Esophageal reflux    eosinophil esophagitis   Family history of adverse reaction to anesthesia    "sister has PONV" (06/21/2017)   Former tobacco use    Gout    Hepatitis C    "treated and cured" (06/21/2017)   High cholesterol    History of blood transfusion    History of kidney stones    Hypertension    Ischemic cardiomyopathy    a. EF 25-30% at intraop TEE 4/17  //  b. Limited Echo 5/17 - EF 45-50%, mild ant HK. c. EF 55-65% by cath 09/2017.   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 HR dropping into 40s in 02/2016 -> BB reduced.   Stroke [facility]) 10/2016   "small one; sometimes my memory/cognitive issues" (06/21/2017)   Symptomatic hypotension    a. 02/2016 ER visit -> meds reduced.   Syncope    Wears dentures    Wears glasses    Past Surgical History:  Past Surgical History:  Procedure Laterality Date   25 GAUGE PARS PLANA VITRECTOMY WITH 20 GAUGE MVR PORT  12/31/2020   Procedure: 25 GAUGE PARS PLANA VITRECTOMY WITH 20 GAUGE MVR PORT;  Surgeon: [name], MD;  Location: [facility];  Service: Thoracic;;   ANTERIOR  CERVICAL DECOMP/DISCECTOMY FUSION N/A 10/17/2018   Procedure: Anterior Cervical Decompression Fusion - Cervical seven -Thoracic one;  Surgeon: Consuella Lose, MD;  Location: [place];  Service: Neurosurgery;  Laterality: N/A;   BASAL CELL CARCINOMA EXCISION     "forehead   BIOPSY  07/20/2019   Procedure: BIOPSY;  Surgeon: Carol Ada, MD;  [facility]: [facility];  Service: Endoscopy;;   BIOPSY OF MEDIASTINAL MASS Right 07/29/2021   Procedure: RIGHT CHEST WALL MASS BIOPSY;  Surgeon: [name], MD;  Location: [facility];  Service: Thoracic;  Laterality: Right;   BRONCHIAL BIOPSY  12/24/2020   Procedure: BRONCHIAL BIOPSIES;  Surgeon: Garner Nash, DO;  Location: [facility];  Service: Pulmonary;;   BRONCHIAL BRUSHINGS  12/24/2020   Procedure: BRONCHIAL BRUSHINGS;  Surgeon: Garner Nash, DO;  Location: [place];  Service: Pulmonary;;   BRONCHIAL NEEDLE ASPIRATION BIOPSY  12/24/2020   Procedure: BRONCHIAL NEEDLE ASPIRATION BIOPSIES;  Surgeon: Garner Nash, DO;  Location: [place];  Service: Pulmonary;;   CARDIAC CATHETERIZATION N/A 11/28/2015   Procedure: Left Heart Cath and Coronary Angiography;  Surgeon: [name], MD;  Location: [place] [facility];  Service: Cardiovascular;  Laterality: N/A;   CARDIAC CATHETERIZATION N/A 11/28/2015  Procedure: Coronary Balloon Angioplasty;  Surgeon: [name], MD;  Location: [place] [facility];  Service: Cardiovascular;  Laterality: N/A;  ostial LAD   CARDIAC CATHETERIZATION N/A 11/28/2015   Procedure: Coronary/Graft Angiography;  Surgeon: [name], MD;  Location: [place] [facility];  Service: Cardiovascular;  Laterality: N/A;  coronaries only    CARDIAC CATHETERIZATION N/A 04/21/2016   Procedure: Left Heart Cath and Coronary Angiography;  Surgeon: Wellington Hampshire, MD;  Location: [place] [facility];  Service: Cardiovascular;  Laterality: N/A;   CARDIAC CATHETERIZATION N/A 06/14/2016   Procedure: Left Heart  Cath and Cors/Grafts Angiography;  Surgeon: Lorretta Harp, MD;  Location: [place] [facility];  Service: Cardiovascular;  Laterality: N/A;   CARDIAC CATHETERIZATION N/A 09/08/2016   Procedure: Left Heart Cath and Cors/Grafts Angiography;  Surgeon: Wellington Hampshire, MD;  Location: [place] [facility];  Service: Cardiovascular;  Laterality: N/A;   CARDIAC CATHETERIZATION     CORONARY ARTERY BYPASS GRAFT N/A 11/28/2015   Procedure: CORONARY ARTERY BYPASS GRAFTING (CABG) TIMES FIVE USING LEFT INTERNAL MAMMARY ARTERY AND RIGHT GREATER SAPHENOUS,VIEN HARVEATED BY ENDOVIEN, INTRAOPPRATIVE TEE;  Surgeon: Gaye Pollack, MD;  Location: [place];  Service: Open Heart Surgery;  Laterality: N/A;   CORONARY STENT INTERVENTION N/A 10/05/2017   Procedure: CORONARY STENT INTERVENTION;  Surgeon: [name], MD;  Location: [place] [facility];  Service: Cardiovascular;  Laterality: N/A;   ESOPHAGOGASTRODUODENOSCOPY (EGD) WITH PROPOFOL N/A 07/20/2019   Procedure: ESOPHAGOGASTRODUODENOSCOPY (EGD) WITH PROPOFOL;  Surgeon: Carol Ada, MD;  [facility];  Service: Endoscopy;  Laterality: N/A;   ESOPHAGOGASTRODUODENOSCOPY (EGD) WITH PROPOFOL N/A 01/30/2021   Procedure: ESOPHAGOGASTRODUODENOSCOPY (EGD) WITH PROPOFOL;  Surgeon: Carol Ada, MD;  [facility];  Service: Endoscopy;  Laterality: N/A;   FIDUCIAL MARKER PLACEMENT  12/24/2020   Procedure: FIDUCIAL DYE MARKER PLACEMENT;  Surgeon: [place]ner Nash, DO;  Location: [place] [facility];  Service: Pulmonary;;   HUMERUS SURGERY Right 1969   "tumor inside bone; filled it w/bone chips"   INTERCOSTAL NERVE BLOCK Right 12/24/2020   Procedure: INTERCOSTAL NERVE BLOCK;  Surgeon: [name], MD;  Location: [place];  Service: Thoracic;  Laterality: Right;   IR IMAGING GUIDED PORT INSERTION  02/06/2021   LEFT HEART CATH AND CORS/GRAFTS ANGIOGRAPHY N/A 03/11/2017   Procedure: Left Heart Cath and Cors/Grafts Angiography;  Surgeon: Leonie Man, MD;   Location: [place] [facility];  Service: Cardiovascular;  Laterality: N/A;   LEFT HEART CATH AND CORS/GRAFTS ANGIOGRAPHY N/A 10/05/2017   Procedure: LEFT HEART CATH AND CORS/GRAFTS ANGIOGRAPHY;  Surgeon: [name], MD;  Location: [place] [facility];  Service: Cardiovascular;  Laterality: N/A;   LEFT HEART CATH AND CORS/GRAFTS ANGIOGRAPHY N/A 04/11/2019   Procedure: LEFT HEART CATH AND CORS/GRAFTS ANGIOGRAPHY;  Surgeon: [name], MD;  Location: [place] [facility];  Service: Cardiovascular;  Laterality: N/A;   NODE DISSECTION Right 12/24/2020   Procedure: NODE DISSECTION;  Surgeon: [name], MD;  Location: [place];  Service: Thoracic;  Laterality: Right;   PERIPHERAL VASCULAR CATHETERIZATION N/A 06/14/2016   Procedure: Lower Extremity Angiography;  Surgeon: Lorretta Harp, MD;  Location: [place] [facility];  Service: Cardiovascular;  Laterality: N/A;   VIDEO BRONCHOSCOPY WITH ENDOBRONCHIAL NAVIGATION Right 12/24/2020   Procedure: VIDEO BRONCHOSCOPY WITH ENDOBRONCHIAL NAVIGATION;  Surgeon: [place]ner Nash, DO;  Location: [place];  Service: Pulmonary;  Laterality: Right;   VIDEO BRONCHOSCOPY WITH ENDOBRONCHIAL ULTRASOUND N/A 12/24/2020   Procedure:  VIDEO BRONCHOSCOPY WITH ENDOBRONCHIAL ULTRASOUND;  Surgeon: [place]ner Nash, DO;  Location: [facility];  Service: Pulmonary;  Laterality: N/A;   VIDEO BRONCHOSCOPY WITH INSERTION OF INTERBRONCHIAL VALVE (IBV) N/A 12/31/2020   Procedure: VIDEO BRONCHOSCOPY WITH INSERTION OF INTERBRONCHIAL VALVE (IBV).VALVE IN CARTRIDGE 15m,9mm. CHEST TUBE PLACEMENT.;  Surgeon: LLajuana Matte MD;  [facility];  Service: Thoracic;  Laterality: N/A;   VIDEO BRONCHOSCOPY WITH INSERTION OF INTERBRONCHIAL VALVE (IBV) N/A 07/29/2021   Procedure: VIDEO BRONCHOSCOPY WITH REMOVAL OF INTERBRONCHIAL VALVE (IBV);  Surgeon: LLajuana Matte MD;  [facility]  Service: Thoracic;  Laterality: N/A;   Social History:  reports that he quit  smoking about 6 years ago. His smoking use included cigarettes. He has a 33.00 pack-year smoking history. He has never used smokeless tobacco. He reports that he does not currently use alcohol. He reports that he does not use drugs.  Family / Support Systems Patient Roles: Spouse Spouse/Significant Other: JLedell NossChildren: LPat KocherOther Supports: KSaundra Shelling(Friend) Anticipated Caregiver: spouse, daughter returning home mid September Ability/Limitations of Caregiver: supervision Caregiver Availability: 24/7 Family Dynamics: support from spouse, daughter and friends  Social History Preferred language: English Religion: [place]- How often do you need to have someone help you when you read instructions, pamphlets, or other written material from your doctor or pharmacy?: Never Writes: Yes Employment Status: Employed Name of Employer: [employer](PT)   Abuse/Neglect Abuse/Neglect Assessment Can Be Completed: Yes Physical Abuse: Denies Verbal Abuse: Denies Sexual Abuse: Denies Exploitation of patient/patient's resources: Denies Self-Neglect: Yes, present (Comment) (Cancer Dx)  Patient response to: Social Isolation - How often do you feel lonely or isolated from those around you?: Never  Emotional Status Recent Psychosocial Issues: Coping:Cancer, Anxiety, Depression Psychiatric History: Coping:Cancer, Anxiety, Depression Substance Abuse History: N/A  Patient / Family Perceptions, Expectations & Goals Pt/Family understanding of illness & functional limitations: yes Premorbid pt/family roles/activities: Limited mobility with RW, required assistance for ADLS  (family hired aide to assist) Anticipated changes in roles/activities/participation: spouse able to provide supervision. Hired assistance for physical care Pt/family expectations/goals: supervision  CA[place]landAgencies: None Premorbid Home Care/DME Agencies: Other  (Comment) (Librarian, academic Rollator, [employer] Transportation available at discharge: spouse able to transport Is the patient able to respond to transportation needs?: Yes In the past 12 months, has lack of transportation kept you from medical appointments or from getting medications?: No In the past 12 months, has lack of transportation kept you from meetings, work, or from getting things needed for daily living?: No Resource referrals recommended: Neuropsychology (Coping:Cancer, Anxiety, Depression)  Discharge Planning Living Arrangements: Spouse/significant other Support Systems: Spouse/significant other, Children, Friends/neighbors Type of Residence: Private residence Insurance Resources: [employer] PMultimedia programmer(specify) (Scientist, clinical (histocompatibility and immunogenetics) Financial Screen Referred: No Living Expenses: Own Money Management: Patient, Spouse Does the patient have any problems obtaining your medications?: No Home Management: Independent Patient/Family Preliminary Plans: Spouse able to assist with cogntive tasks Care Coordinator Anticipated Follow Up Needs: HH/OP Expected length of stay: 9-11 Days  Clinical Impression SW met with patient, daughter and friend in room introduced self and explained role. Patient anticipates discharge home with assistance from spouse, friend and daughter until she returns home. [facility] Family would like to transport patient and return him back to [facility], sw following up with physician. No additional questions or concerns.   CDyanne Iha9/15/2023, 12:29 PM

## 2022-05-14 NOTE — Evaluation (Signed)
Occupational Therapy Assessment and Plan  Patient Details  Name: [name] MRN: [id number] Date of Birth: 1955-11-07  OT Diagnosis: abnormal posture, acute pain, cognitive deficits, disturbance of vision, muscular wasting and disuse atrophy, muscle weakness (generalized), and pain in joint Rehab Potential: Rehab Potential (ACUTE ONLY): Good ELOS: 7-10 days   Today's Date: 05/14/2022 OT Individual Time: 0160-1093 & 1320-1415 OT Individual Time Calculation (min): 75 min  & 55 min  Hospital Problem: Principal Problem:   Hypotension, postural   Past Medical History:  Past Medical History:  Diagnosis Date   Anemia    Anxiety    Arthritis    Basal cell carcinoma (BCC) of forehead    CAD (coronary artery disease)    a. 10/2015 ant STEMI >> LHC with 3 v CAD; oLAD tx with POBA >> emergent CABG. b. Multiple evals since that time, early graft failure of SVG-RCA by cath 03/2016. c. 2/19 PCI/DES x1 to pRCA, normal EF.   Carotid artery disease ([place])    a. 40-59% BICA 02/2018.   Depression    Dyspnea    Ectopic atrial tachycardia (HCC)    Esophageal reflux    eosinophil esophagitis   Family history of adverse reaction to anesthesia    "sister has PONV" (06/21/2017)   Former tobacco use    Gout    Hepatitis C    "treated and cured" (06/21/2017)   High cholesterol    History of blood transfusion    History of kidney stones    Hypertension    Ischemic cardiomyopathy    a. EF 25-30% at intraop TEE 4/17  //  b. Limited Echo 5/17 - EF 45-50%, mild ant HK. c. EF 55-65% by cath 09/2017.   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 HR dropping into 40s in 02/2016 -> BB reduced.   Stroke [facility]) 10/2016   "small one; sometimes my memory/cognitive issues" (06/21/2017)   Symptomatic hypotension    a. 02/2016 ER visit -> meds reduced.   Syncope    Wears dentures     Wears glasses    Past Surgical History:  Past Surgical History:  Procedure Laterality Date   25 GAUGE PARS PLANA VITRECTOMY WITH 20 GAUGE MVR PORT  12/31/2020   Procedure: 25 GAUGE PARS PLANA VITRECTOMY WITH 20 GAUGE MVR PORT;  Surgeon: [name], MD;  [facility];  Service: Thoracic;;   ANTERIOR CERVICAL DECOMP/DISCECTOMY FUSION N/A 10/17/2018   Procedure: Anterior Cervical Decompression Fusion - Cervical seven -Thoracic one;  Surgeon: Consuella Lose, MD;  Location: [place];  Service: Neurosurgery;  Laterality: N/A;   BASAL CELL CARCINOMA EXCISION     "forehead   BIOPSY  07/20/2019   Procedure: BIOPSY;  Surgeon: Carol Ada, MD;  [facility]: [facility];  Service: Endoscopy;;   BIOPSY OF MEDIASTINAL MASS Right 07/29/2021   Procedure: RIGHT CHEST WALL MASS BIOPSY;  Surgeon: [name], MD;  Location: [place];  Service: Thoracic;  Laterality: Right;   BRONCHIAL BIOPSY  12/24/2020   Procedure: BRONCHIAL BIOPSIES;  Surgeon: Garner Nash, DO;  Location: [place] [facility];  Service: Pulmonary;;   BRONCHIAL BRUSHINGS  12/24/2020   Procedure: BRONCHIAL BRUSHINGS;  Surgeon: Garner Nash, DO;  Location: [place];  Service: Pulmonary;;   BRONCHIAL NEEDLE ASPIRATION BIOPSY  12/24/2020   Procedure: BRONCHIAL NEEDLE ASPIRATION BIOPSIES;  Surgeon: Garner Nash, DO;  Location: [place] [facility];  Service: Pulmonary;;   CARDIAC CATHETERIZATION N/A 11/28/2015   Procedure: Left Heart Cath and Coronary Angiography;  Surgeon: [name], MD;  Location: [place] CV LAB;  Service: Cardiovascular;  Laterality: N/A;   CARDIAC CATHETERIZATION N/A 11/28/2015   Procedure: Coronary Balloon Angioplasty;  Surgeon: [name], MD;  Location: [place] [facility] LAB;  Service: Cardiovascular;  Laterality: N/A;  ostial LAD   CARDIAC CATHETERIZATION N/A 11/28/2015   Procedure: Coronary/Graft Angiography;  Surgeon: [name], MD;  Location: [place] CV LAB;  Service:  Cardiovascular;  Laterality: N/A;  coronaries only    CARDIAC CATHETERIZATION N/A 04/21/2016   Procedure: Left Heart Cath and Coronary Angiography;  Surgeon: Wellington Hampshire, MD;  Location: [place] CV LAB;  Service: Cardiovascular;  Laterality: N/A;   CARDIAC CATHETERIZATION N/A 06/14/2016   Procedure: Left Heart Cath and Cors/Grafts Angiography;  Surgeon: Lorretta Harp, MD;  Location: [place] CV LAB;  Service: Cardiovascular;  Laterality: N/A;   CARDIAC CATHETERIZATION N/A 09/08/2016   Procedure: Left Heart Cath and Cors/Grafts Angiography;  Surgeon: Wellington Hampshire, MD;  Location: [place] CV LAB;  Service: Cardiovascular;  Laterality: N/A;   CARDIAC CATHETERIZATION     CORONARY ARTERY BYPASS GRAFT N/A 11/28/2015   Procedure: CORONARY ARTERY BYPASS GRAFTING (CABG) TIMES FIVE USING LEFT INTERNAL MAMMARY ARTERY AND RIGHT GREATER SAPHENOUS,VIEN HARVEATED BY ENDOVIEN, INTRAOPPRATIVE TEE;  Surgeon: Gaye Pollack, MD;  Location: [place];  Service: Open Heart Surgery;  Laterality: N/A;   CORONARY STENT INTERVENTION N/A 10/05/2017   Procedure: CORONARY STENT INTERVENTION;  Surgeon: [name], MD;  Location: [place] CV LAB;  Service: Cardiovascular;  Laterality: N/A;   ESOPHAGOGASTRODUODENOSCOPY (EGD) WITH PROPOFOL N/A 07/20/2019   Procedure: ESOPHAGOGASTRODUODENOSCOPY (EGD) WITH PROPOFOL;  Surgeon: Carol Ada, MD;  [facility];  Service: Endoscopy;  Laterality: N/A;   ESOPHAGOGASTRODUODENOSCOPY (EGD) WITH PROPOFOL N/A 01/30/2021   Procedure: ESOPHAGOGASTRODUODENOSCOPY (EGD) WITH PROPOFOL;  Surgeon: Carol Ada, MD;  [facility];  Service: Endoscopy;  Laterality: N/A;   FIDUCIAL MARKER PLACEMENT  12/24/2020   Procedure: FIDUCIAL DYE MARKER PLACEMENT;  Surgeon: Garner Nash, DO;  Location: [place] [facility];  Service: Pulmonary;;   HUMERUS SURGERY Right 1969   "tumor inside bone; filled it w/bone chips"   INTERCOSTAL NERVE BLOCK Right 12/24/2020    Procedure: INTERCOSTAL NERVE BLOCK;  Surgeon: [name], MD;  Location: [place];  Service: Thoracic;  Laterality: Right;   IR IMAGING GUIDED PORT INSERTION  02/06/2021   LEFT HEART CATH AND CORS/GRAFTS ANGIOGRAPHY N/A 03/11/2017   Procedure: Left Heart Cath and Cors/Grafts Angiography;  Surgeon: Leonie Man, MD;  Location: [place] CV LAB;  Service: Cardiovascular;  Laterality: N/A;   LEFT HEART CATH AND CORS/GRAFTS ANGIOGRAPHY N/A 10/05/2017   Procedure: LEFT HEART CATH AND CORS/GRAFTS ANGIOGRAPHY;  Surgeon: [name], MD;  Location: [place] CV LAB;  Service: Cardiovascular;  Laterality: N/A;   LEFT HEART CATH AND CORS/GRAFTS ANGIOGRAPHY N/A 04/11/2019   Procedure: LEFT HEART CATH AND CORS/GRAFTS ANGIOGRAPHY;  Surgeon: [name], MD;  Location: [place] [facility] LAB;  Service: Cardiovascular;  Laterality: N/A;   NODE DISSECTION Right 12/24/2020   Procedure: NODE DISSECTION;  Surgeon: [name], MD;  Location: [place];  Service: Thoracic;  Laterality: Right;   PERIPHERAL VASCULAR CATHETERIZATION N/A 06/14/2016   Procedure: Lower Extremity Angiography;  Surgeon: Lorretta Harp, MD;  Location: [place]  CV LAB;  Service: Cardiovascular;  Laterality: N/A;   VIDEO BRONCHOSCOPY WITH ENDOBRONCHIAL NAVIGATION Right 12/24/2020   Procedure: VIDEO BRONCHOSCOPY WITH ENDOBRONCHIAL NAVIGATION;  Surgeon: Garner Nash, DO;  Location: [place];  Service: Pulmonary;  Laterality: Right;   VIDEO BRONCHOSCOPY WITH ENDOBRONCHIAL ULTRASOUND N/A 12/24/2020   Procedure: VIDEO BRONCHOSCOPY WITH ENDOBRONCHIAL ULTRASOUND;  Surgeon: Garner Nash, DO;  Location: [place];  Service: Pulmonary;  Laterality: N/A;   VIDEO BRONCHOSCOPY WITH INSERTION OF INTERBRONCHIAL VALVE (IBV) N/A 12/31/2020   Procedure: VIDEO BRONCHOSCOPY WITH INSERTION OF INTERBRONCHIAL VALVE (IBV).VALVE IN CARTRIDGE 16m,9mm. 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  [facility] complaining of generalized weakness and multiple falls for approximately 3 weeks.  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  He is s/p whole brain radiation currently on experimental chemotherapy. [facility] is typically held during hospital admission according to trial study coordinator.  Neurology was consulted and he underwent MRI of cervical and thoracic spine this admission.  He is currently being aggressively treated for significant orthostatic hypotension and cardiology has been consulted.  Multiple medications have been held and he is on midodrine 3 times daily.  This has improved however he he still tachycardic to 140s when ambulating. His past medical history significant for coronary artery disease status post emergent CABG in 2017.  He also has a history of paroxysmal atrial fibrillation maintained on Xarelto.  Current ejection fraction is estimated at 55 to 60%. The patient requires inpatient physical medicine and rehabilitation evaluations and treatment secondary to dysfunction due to metastatic large cell neuroendocrine carcinoma. Patient transferred to CIR on 05/13/2022 .    Patient currently requires min with basic self-care skills secondary to muscle weakness, decreased cardiorespiratoy endurance, decreased coordination, decreased memory, and decreased standing balance.  Prior to hospitalization, patient  could complete all self-care independently.  Patient will benefit from skilled intervention to decrease level of assist with basic self-care skills prior to discharge home with care partner.  Anticipate patient will require intermittent supervision and follow up home health.  OT - End of Session Activity Tolerance: Tolerates < 10 min activity with changes in vital signs Endurance Deficit: Yes Endurance Deficit Description: Global deconditioning and poor activity tolerance; orthostatic and tachycardic with stance position OT Assessment Rehab Potential (ACUTE ONLY): Good OT Barriers to Discharge: Home environment access/layout;Pending chemo/radiation OT Patient demonstrates impairments in the following area(s): Balance;Cognition;Endurance;Motor;Pain;Vision OT Basic ADL's Functional Problem(s): Bathing;Dressing;Toileting OT Transfers Functional Problem(s): Toilet;Tub/Shower OT Additional Impairment(s): None OT Plan OT Intensity: Minimum of 1-2 x/day, 45 to 90 minutes OT Frequency: 5 out of 7 days OT Duration/Estimated Length of Stay: 7-10 days OT Treatment/Interventions: DME/adaptive equipment instruction;Patient/family education;Therapeutic Activities;Balance/vestibular training;Cognitive remediation/compensation;Psychosocial support;Therapeutic Exercise;Community reintegration;Functional mobility training;Self Care/advanced ADL retraining;UE/LE Strength taining/ROM;Discharge planning;UE/LE Coordination activities;Pain management;Visual/perceptual remediation/compensation OT Self Feeding Anticipated Outcome(s): Indep OT Basic Self-Care Anticipated Outcome(s): Mod I OT Toileting Anticipated Outcome(s): Mod I OT Bathroom Transfers Anticipated Outcome(s):  Mod I OT Recommendation Recommendations for Other Services: Neuropsych consult;Therapeutic Recreation consult Therapeutic Recreation Interventions: Pet therapy;Stress management Patient destination: Home Follow Up Recommendations:  None Equipment Recommended: To be determined   OT Evaluation Precautions/Restrictions  Precautions Precautions: Fall Precaution Comments: orthostatic, monitor HR Other Brace: Has abdominal binder and TEDs Restrictions Weight Bearing Restrictions: No Home Living/Prior Functioning Home Living Family/patient expects to be discharged to:: Private residence Living Arrangements: Spouse/significant other Available Help at Discharge: Family, Available 24 hours/day (daughter lives out of town, is available as needed. Wife recently had back surgery so will be unable to physically assist for period of time) Type of Home: House Home Access: Stairs to enter [employer] of Steps: 6 Entrance Stairs-Rails: Right, Left Home Layout: Two level, Bed/bath upstairs, 1/2 bath on main level Alternate Level Stairs-Number of Steps: 13 Alternate Level Stairs-Rails: Can reach both Bathroom Shower/Tub: Tub/shower unit, Walk-in shower (sliding door in walk in shower) Bathroom Toilet: Standard Bathroom Accessibility: No Additional Comments: daughter staying through mid september to assist, lives out of state. Been sleeping downstairs on couch. 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 Level of Independence: Independent with basic ADLs, Independent with homemaking with ambulation, Requires assistive device for independence (pt was independent in gait/ADLs, however 1 month leading up to hospital admission pt needed a RW for mobility)  Able to Take Stairs?: Yes Driving: No Vision Baseline Vision/History: 1 Wears glasses (for distance) Ability to See in Adequate Light: 0 Adequate Patient Visual Report: Blurring of vision Vision Assessment?: Yes Eye Alignment:  Within Functional Limits Ocular Range of Motion: Within Functional Limits Tracking/Visual Pursuits: Decreased smoothness of horizontal tracking Saccades: Decreased speed of saccadic movement Visual Fields: No apparent deficits Perception  Perception: Within Functional Limits Praxis Praxis: Intact Cognition Cognition Overall Cognitive Status: Within Functional Limits for tasks assessed Arousal/Alertness: Awake/alert Orientation Level: Person;Place;Situation Person: Oriented Place: Oriented Situation: Oriented Memory: Appears intact Awareness: Appears intact Problem Solving: Appears intact Safety/Judgment: Appears intact Brief Interview for Mental Status (BIMS) Repetition of Three Words (First Attempt): 3 Temporal Orientation: Year: Correct Temporal Orientation: Month: Missed by 6 days to 1 month Temporal Orientation: Day: Correct Recall: "Sock": Yes, no cue required Recall: "Blue": Yes, after cueing ("a color") Recall: "Bed": Yes, after cueing ("a piece of furniture") BIMS Summary Score: 12 Sensation Sensation Light Touch: Impaired by gross assessment Hot/Cold: Appears Intact Proprioception: Appears Intact Stereognosis: Not tested Additional Comments: Reports numbness/tingling in distal fingers LUE>RUE Coordination Gross Motor Movements are Fluid and Coordinated: No Fine Motor Movements are Fluid and Coordinated: No Coordination and Movement Description: uncoordinated 2/2 generalized weakness, debility, dizziness and pain Finger Nose Finger Test: Tremorous on LUE, mild dysmetria bilaterally Motor  Motor Motor: Ataxia;Abnormal postural alignment and control Motor - Skilled Clinical Observations: Patient presents with impaired L LE coordination especially during swing phase of gait.  Trunk/Postural Assessment  Cervical Assessment Cervical Assessment: Within Functional Limits Thoracic Assessment Thoracic Assessment: Exceptions to [facility] (In standing, patient presents with L  shoulder elevation and R should despression and lateral trunk lean) Lumbar Assessment Lumbar Assessment: Exceptions to [facility] (R hip hike with shortened lumbar on R side and elongated on L) Postural Control Postural Control: Within Functional Limits  Balance Balance Balance Assessed: Yes Static Sitting Balance Static Sitting - Balance Support: Feet supported Static Sitting -  Level of Assistance: 5: Stand by assistance Dynamic Sitting Balance Dynamic Sitting - Balance Support: Feet supported Dynamic Sitting - Level of Assistance: 5: Stand by assistance Static Standing Balance Static Standing - Balance Support: Bilateral upper extremity supported;During functional activity Static Standing - Level of Assistance:  (CGA) Dynamic Standing Balance Dynamic Standing - Balance Support: Bilateral upper extremity supported;During functional activity Dynamic Standing - Level of Assistance: 4: Min assist Extremity/Trunk Assessment RUE Assessment RUE Assessment: Exceptions to [facility] Active Range of Motion (AROM) Comments: WFL General Strength Comments: 4+/5 proximally, WFL distally LUE Assessment LUE Assessment: Exceptions to [facility] Active Range of Motion (AROM) Comments: WFL General Strength Comments: 4+/5 proximally, WFL distally  Care Tool Care Tool Self Care Eating   Eating Assist Level: Set up assist    Oral Care    Oral Care Assist Level: Set up assist    Bathing   Body parts bathed by patient: Right arm;Left arm;Chest;Abdomen;Front perineal area;Buttocks;Right upper leg;Left upper leg;Face Body parts bathed by helper: Right lower leg;Left lower leg   Assist Level: Minimal Assistance - Patient > 75%    Upper Body Dressing(including orthotics)   What is the patient wearing?: Pull over shirt   Assist Level: Set up assist    Lower Body Dressing (excluding footwear)   What is the patient wearing?: Pants Assist for lower body dressing: Minimal Assistance - Patient > 75%    Putting  on/Taking off footwear   What is the patient wearing?: Ted hose;Shoes Assist for footwear: Dependent - Patient 0%       Care Tool Toileting Toileting activity   Assist for toileting: Moderate Assistance - Patient 50 - 74%     Care Tool Bed Mobility Roll left and right activity        Sit to lying activity        Lying to sitting on side of bed activity   Lying to sitting on side of bed assist level: the ability to move from lying on the back to sitting on the side of the bed with no back support.: Supervision/Verbal cueing     Care Tool Transfers Sit to stand transfer   Sit to stand assist level: Contact Guard/Touching assist    Chair/bed transfer         Toilet transfer   Assist Level: Minimal Assistance - Patient > 75%     Care Tool Cognition  Expression of Ideas and Wants Expression of Ideas and Wants: 4. Without difficulty (complex and basic) - expresses complex messages without difficulty and with speech that is clear and easy to understand  Understanding Verbal and Non-Verbal Content Understanding Verbal and Non-Verbal Content: 3. Usually understands - understands most conversations, but misses some part/intent of message. Requires cues at times to understand   Memory/Recall Ability Memory/Recall Ability : Current season;That he or she is in a hospital/hospital unit   Refer to Care Plan for [place] 1 OT Short Term Goal 1 (Week 1): STG = LTG 2/2 ELOS  Recommendations for other services: Neuropsych and Therapeutic Recreation  Pet therapy and Stress management   Skilled Therapeutic Intervention Session 1 Skilled OT intervention completed with discussion on POC, rehab goals and explanation of OT purpose. Pt received supine in bed, agreeable to session. Report of 6/10 pain in ribs, with nurse in room for medication intervention and MD aware during rounds. Pt completed upright in bed to EOB mobility with supervision, then seated EOB,  completed bathing and dressing.  See caretool for further details on assist level with self-care tasks performed. Generally pt was orthostatic with sit > stands, however asymptomatic unless with longer durations in stance/transfers. Completed sit > stands intermittently with/without RW with CGA, then stand pivot > recliner with light min A using RW. Pt was left seated in recliner, with chair pad alarm and all immediate needs met at end of session.  BP/HR vitals 111/68 semi-supine; asymptomatic 121/81 Sitting EOB; asymptomatic 82/71 Standing; asymptomatic, HR at 145 bpm 110/89 after stand pivot with legs elevated in recliner; Thigh high TEDS and abdominal binder donned (did alert MD that current abdominal binder was several sizes too big, with new one being ordered)  Session 2 Skilled OT intervention completed with focus on BP management, and ambulatory endurance, to promote independence with BADLs. Pt received seated in recliner, no c/o pain, daughter present. Therapist discussed pt's CLOF, rehab goals and prospected d/c date per OT/PT evals in AM. Smaller abdominal binder in room, with pt and daughter having questions about it's purpose. Therapist provided education regarding BP management, typical progression, focus of cardiovascular endurance, as well as cancer/chemo status that altogether can influence his vitals.   Completed sit > stand with CGA, doffed pants with CGA, for therapist to donn ACE wrap for BP management. OF note- with ACE wrap applied, pt did not have orthostatic trend, actually increased with activity, therefore discussed with pt donning every morning and potential need of new thigh high TEDs if stretched out too much. Ambulated 100' with RW and CGA, with postural imbalance noted with R shoulder depression/L shoulder elevation and spinal drift towards L. Pt declined having scoliosis, however discussed reasons why his alignment might be affected. Seated rest breaks needed for  fatigue/dizziness. Ambulated 100' ft at same assist, cues needed for attending to objects on L side- pt stated he could see them but did bump into items frequently on L side.   Ambulated 200' continuously using RW with CGA. Returned to room, where pt sat EOB and transitioned supine in bed with supervision. Doffed abdominal binder with total A, however recommended pt keep TEDS donned until done getting up for the day to prevent orthostasis with bathroom visits. Pt remained upright in bed, with bed alarm on and all needs in reach at end of session.  BP/HR vitals 102/70 sitting 80/66 standing; asymptomatic; new binder/thigh high TEDs already donned Donned wide ACE wrap- knee high to bilateral LE for additional compression 103/72 standing; asymptomatic 126/88 Ambulation trial 1; symptomatic 4/10 dizziness report 117/76 Ambulation trial 2; symptomatic 5/10 dizziness report, HR 136bpm   ADL ADL Eating: Set up Where Assessed-Eating: Bed level Grooming: Setup Where Assessed-Grooming: Edge of bed Upper Body Bathing: Supervision/safety Where Assessed-Upper Body Bathing: Edge of bed Lower Body Bathing: Minimal assistance Where Assessed-Lower Body Bathing: Edge of bed Upper Body Dressing: Setup Where Assessed-Upper Body Dressing: Edge of bed Lower Body Dressing: Minimal assistance Where Assessed-Lower Body Dressing: Edge of bed Toileting: Moderate assistance Where Assessed-Toileting: Medical laboratory scientific officer: Minimal assistance (simulated to recliner) Toilet Transfer Method: Stand pivot Tub/Shower Transfer: Unable to assess Tub/Shower Transfer Method: Unable to assess Social research officer, government: Unable to assess [employer] Transfer Method: Unable to assess Mobility  Bed Mobility Bed Mobility: Rolling Right;Rolling Left;Supine to Sit;Sit to Supine Rolling Right: Independent Rolling Left: Independent Supine to Sit: Independent Sit to Supine: Independent Transfers Sit to Stand: Minimal  Assistance - Patient > 75% Stand to Sit: Minimal Assistance - Patient > 75%   Discharge Criteria: Patient will be  discharged from OT if patient refuses treatment 3 consecutive times without medical reason, if treatment goals not met, if there is a change in medical status, if patient makes no progress towards goals or if patient is discharged from hospital.  The above assessment, treatment plan, treatment alternatives and goals were discussed and mutually agreed upon: by patient  Blase Mess, MS, OTR/L  05/14/2022, 3:02 PM

## 2022-05-14 NOTE — Progress Notes (Signed)
Patient ID: DARNELLE CORP, male   DOB: 11-03-1955, 66 y.o.   MRN: [id number] Met with the patient to review current situation, rehab process, team conference and plan of care. Patient nervous about going home too soon. Discussed issues with nausea and pain along with secondary issues; AF on Xarelto. CKD, orthostatic hypotension; using TEDS and binder when OOB and HLD with anemia. Patient with memory deficits; using notebook for notes/reminders. Discussed food preferences; family to bring in food from home; likes soups. Continue to follow along to address educational needs to facilitate preparation for discharge home. Margarito Liner

## 2022-05-15 DIAGNOSIS — I951 Orthostatic hypotension: Secondary | ICD-10-CM

## 2022-05-15 DIAGNOSIS — Z515 Encounter for palliative care: Secondary | ICD-10-CM

## 2022-05-15 DIAGNOSIS — D3A8 Other benign neuroendocrine tumors: Secondary | ICD-10-CM | POA: Diagnosis not present

## 2022-05-15 DIAGNOSIS — Z7189 Other specified counseling: Secondary | ICD-10-CM

## 2022-05-15 NOTE — Progress Notes (Signed)
Occupational Therapy Session Note  Patient Details  Name: [name] MRN: [id number] Date of Birth: [birth year]  Today's Date: 05/15/2022 OT Individual Time: 0700-0800 OT Individual Time Calculation (min): 60 min    Short Term Goals: Week 1:  OT Short Term Goal 1 (Week 1): STG = LTG 2/2 ELOS  Skilled Therapeutic Interventions/Progress Updates:    OT session focused on functional transfers, endurance, and balance. Pt received supine in bed agreeable to therapy. OT donned thigh high TED hose and applied ace wraps to BLE for hypotension management. See BP results below. Pt completed toileting, ambulating to/from bathroom with CGA using RW and completing toileting task with CGA for clothing management. Pt ambulated to therapy gym engaging in dynamic standing balance activity 2x 2 min without support from AD and CGA. Provided longer rest breaks due to fatigue. Pt ambulated back to room with CGA using RW and left with all needs in reach.  Vitals during session Supine upon arrival: BP 111/70 HR 81 Post transfer to bathroom (TED hose, etc donned): BP 119/86 HR 109 During functional activity: BP 116/82 HR 126  Therapy Documentation Precautions:  Precautions Precautions: Fall Precaution Comments: orthostatic, monitor HR Other Brace: Has abdominal binder and TEDs Restrictions Weight Bearing Restrictions: No General:   Vital Signs:   Pain: 7/10 at site of cancer  ADL: ADL Eating: Set up Where Assessed-Eating: Bed level Grooming: Setup Where Assessed-Grooming: Edge of bed Upper Body Bathing: Supervision/safety Where Assessed-Upper Body Bathing: Edge of bed Lower Body Bathing: Minimal assistance Where Assessed-Lower Body Bathing: Edge of bed Upper Body Dressing: Setup Where Assessed-Upper Body Dressing: Edge of bed Lower Body Dressing: Minimal assistance Where Assessed-Lower Body Dressing: Edge of bed Toileting: Moderate assistance Where Assessed-Toileting: Manufacturing systems engineer: Minimal assistance (simulated to recliner) Toilet Transfer Method: Stand pivot Tub/Shower Transfer: Unable to assess Tub/Shower Transfer Method: Unable to assess [employer] Transfer: Unable to assess [employer] Transfer Method: Unable to assess Vision   Perception    Praxis   Balance   Exercises:   Other Treatments:     Therapy/Group: Individual Therapy  Duayne Cal 05/15/2022, 9:08 AM

## 2022-05-15 NOTE — IPOC Note (Addendum)
Overall Plan of Care [facility]) Patient Details Name: [name] MRN: [id number] DOB: 12-19-1955  Admitting Diagnosis: Hypotension, postural  Hospital Problems: Principal Problem:   Hypotension, postural     Functional Problem List: Nursing Bowel, Pain, Endurance, Medication Management, Safety  PT Balance, Pain, Endurance, Sensory, Motor  OT Balance, Cognition, Endurance, Motor, Pain, Vision  SLP Cognition  TR         Basic ADL's: OT Bathing, Dressing, Toileting     Advanced  ADL's: OT       Transfers: PT Bed Mobility, Bed to Chair, Car  OT Toilet, Tub/Shower     Locomotion: PT Ambulation, Stairs     Additional Impairments: OT None  SLP Social Cognition   Attention, Memory  TR      Anticipated Outcomes Item Anticipated Outcome  Self Feeding Indep  Swallowing      Basic self-care  Mod I  Toileting  Mod I   Bathroom Transfers Mod I  Bowel/Bladder  manage w mod I assist  Transfers  ModI with RW  Locomotion  ModI with RW- 150'  Communication     Cognition  sup A  Pain  <4 with prns  Safety/Judgment  w cues   Therapy Plan: PT Intensity: Minimum of 1-2 x/day ,45 to 90 minutes PT Frequency: 5 out of 7 days PT Duration Estimated Length of Stay: 7-10 OT Intensity: Minimum of 1-2 x/day, 45 to 90 minutes OT Frequency: 5 out of 7 days OT Duration/Estimated Length of Stay: 7-10 days SLP Intensity: Minumum of 1-2 x/day, 30 to 90 minutes SLP Frequency: 1 to 3 out of 7 days SLP Duration/Estimated Length of Stay: 7-10 days   Team Interventions: Nursing Interventions Patient/Family Education, Bowel Management, Medication Management, Pain Management, Disease Management/Prevention, Discharge Planning  PT interventions Ambulation/gait training, Cognitive remediation/compensation, DME/adaptive equipment instruction, Discharge planning, Functional mobility training, Pain management, Psychosocial support, Therapeutic Activities, Splinting/orthotics, UE/LE  Strength taining/ROM, Visual/perceptual remediation/compensation, Training and development officer, Community reintegration, Disease management/prevention, Barrister's clerk education, Neuromuscular re-education, IT trainer, Therapeutic Exercise, UE/LE Coordination activities  OT Interventions DME/adaptive equipment instruction, Patient/family education, Therapeutic Activities, Training and development officer, Cognitive remediation/compensation, Psychosocial support, Therapeutic Exercise, Community reintegration, Functional mobility training, Self Care/advanced ADL retraining, UE/LE Strength taining/ROM, Discharge planning, UE/LE Coordination activities, Pain management, Visual/perceptual remediation/compensation  SLP Interventions Cognitive remediation/compensation, Internal/external aids, Patient/family education  TR Interventions    SW/CM Interventions Discharge Planning, Psychosocial Support, Patient/Family Education, Disease Management/Prevention   Barriers to Discharge MD  Medical stability  Nursing Decreased caregiver support, Home environment access/layout 2 level 3 ste main B+B w spouse  PT Home environment access/layout, Pending chemo/radiation    OT Home environment access/layout, Pending chemo/radiation    SLP Pending chemo/radiation    SW       Team Discharge Planning: Destination: PT- Home ,OT- Home , SLP-Home Projected Follow-up: PT-Outpatient PT (Versus HHC pending family/transportation availability), OT-  None, SLP-None Projected Equipment Needs: PT-To be determined, OT- To be determined, SLP-To be determined Equipment Details: PT-Patient already owns a RW, OT-  Patient/family involved in discharge planning: PT- Patient,  OT-Patient, SLP-Patient  MD ELOS: 10-14 days Medical Rehab Prognosis:  Excellent Assessment: The patient has been admitted for CIR therapies with the diagnosis of large cell neuroendocrine carcinoma. The team will be addressing functional mobility, strength,  stamina, balance, safety, adaptive techniques and equipment, self-care, bowel and bladder mgt, patient and caregiver education. Goals have been set at supervision. Anticipated discharge destination is home.        See Team Conference Notes for  weekly updates to the plan of care

## 2022-05-15 NOTE — Progress Notes (Signed)
Occupational Therapy Session Note  Patient Details  Name: [name] MRN: [id number] Date of Birth: 1956-01-19  Today's Date: 05/15/2022 OT Individual Time: 5009-3818 OT Individual Time Calculation (min): 48 min    Short Term Goals: Week 1:  OT Short Term Goal 1 (Week 1): STG = LTG 2/2 ELOS  Skilled Therapeutic Interventions/Progress Updates:  Pt awake seated in recliner with daughter present upon OT arrival to the room. Pt's daughter leaves shortly after OT session. Pt reports, "I won't get started with the pain medicine thing." Pt in agreement for OT session.  Therapy Documentation Precautions:  Precautions Precautions: Fall Precaution Comments: orthostatic, monitor HR Other Brace: Has abdominal binder and TEDs Restrictions Weight Bearing Restrictions: No Vital Signs: Please see "Flowsheet" for most recent vitals charted by nursing staff.  Pain: Pain Assessment Pain Scale: 0-10 Pain Score: 7  Pain Type: Acute pain Pain Location: Abdomen Pain Orientation: Right Pain Descriptors / Indicators: Aching;Sharp Pain Onset: On-going Pain Intervention(s): Distraction;Emotional support Multiple Pain Sites: No  ADL: Pt declines need to perform ADLs at this time.   Orthostatic Hypotension Education: Education provided to pt on fall risks related to orthostatic hypotension. OT encouraged pt to closely monitor BP with all mobility after DC in order to decrease fall risks. OT took orthostatic vitals listed below: -seated in recliner: 109/70  -standing 91/73 -after ~2 minutes of standing: 112/74  OT provides education on difference in vital reading and how providing rest breaks in each position can assist with managing hypotension. OT recommendation pt obtain a blood pressure machine to closely monitor BP with all position changes. OT provides assistance to pt to order a wrist BP machine to closely monitor BP with all mobility. OT encouraged pt to have family bring this to  hospital to allow therapy to practice with pt to practice closely monitoring BP to aid in safety with DC.   Functional Mobility:  Pt participates in functional mobility in order to improve functional endurance to improve safety needed prior to DC. Pt able to perform sit <> stand from recliner, ambulate to room <> nurses station (~150' x 2) > EOB with CGA and FWW with no LOB noted. Pt able to perform sit > supine transfer with close supervision for safety. Pt noted to have lateral trunk lean to right. This is noted to potentially be form pt's guarding position secondary pain in R trunk. OT provides gentle manual positioning to improve posture, however, pt reports increased discomfort.   Pt returned to bed at end of session. Pt left resting comfortably in bed with personal belongings and call light within reach, bed alarm on and activated, bed in low position, 3 bed rails up, and comfort needs attended to.   Therapy/Group: Individual Therapy  [name] 05/15/2022, 12:55 PM

## 2022-05-15 NOTE — Consult Note (Signed)
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 He was found to have significant orthostatic hyptension and was evaluated by neuro and cardiology with admission 9/4 to [facility]. He was   transferred to  to [facility] for continues skilled services to maximize functional mobility, safety, and increase independence.   Clinical Assessment/Goals of Care: I have reviewed medical records including EPIC notes, labs and imaging, received report from bedside RN, assessed the patient.    I met with Mr. Modi and his wife [name] to further discuss diagnosis prognosis, GOC, symptoms and options.Mr. Cederberg is know to our service and is followed at [facility] for support and symptom management.   We had a long discussion about his symptoms, mostly pain which is right lower ribs/right upper quadrant radiating to back and headaches.  Pain baseline at rest is 5-6/10 and increases to 8/10 with activity at times, which to him is very severe. He wanted to talk to someone from Pharmacy to understand why he could not receive IV narcotics in CIR when he was able to receive them in the hospital. We reviewed medications he has taken in the past and current regimen.  I also provided education on onset of action of oral and IV hydromorphone. Usually rehab medication route of oral for transition to home. He was receptive to the discussion. We reviewed how he has been  taking his medication. He has MS Contin 60 mg twice a day standing order, Hydromorphone 4 mg every 4 hours as needed for moderate pain, and 8 mg every 4 hours as needed for severe pain.  He has acetaminophen every 4 hours as needed.  He states the tylenol works best for his headaches. He understood that his home regimen was hydromorphone 8 mg every 4 hours. He has been taking hydromorphone intermittently orally, he states  times there is a 12 hour gap in medication taken.  We discussed peaks and valleys of pain and medication effectiveness. Yesterday he took 20 mg hydromorphone and today has had 12 as of our afternoon meeting. The voltaran gel does help with rib specific pain. He has previously tried lidocaine patches but felt they did nothing.  He is in agreement to asking for pain medication when it has been close to 4 hours and his pain is moderate to obtain treatment to prevent it from progressing to severe. He shared there had been an availability issue of the hydromorphone on the unit and a delay in obtaining it from pharmacy had been a concern.  We discussed that this medication can be given by liquid orally in the future if he has difficulty swallowing. He has taken Fentanyl in the past. We discussed patches as a potential although hydromorphone is a high level narcotic. Palliative team will monitor pain medication taken to see if adjustments in dose are needed.  Values and goals of care important to patient and family were attempted to be elicited.  His is realistic in stating he  has a disease process that he will die from but he wants it to be along time from now. He wants pain controlled and to be more functional to be at home.  Discussed the importance of continued conversation with family and their  medical providers regarding overall plan of care and treatment options, ensuring decisions are within the context of the patients values and GOCs.  Decision Maker: patient  SUMMARY OF RECOMMENDATIONS    Code Status/Advance Care Planning:  DNAR/DNI   Symptom Management:  Pain: MS Contin 60 mg twice a day, Hydromorphone 4 mg every 4 hours as needed for moderate pain, and 8 mg every 4 hours as needed for severe pain, acetaminophen every 4 hours as needed. Voltaran 1% topical gel  4 times a day. No current change in written orders. Metocarbamol 500 mg every 6 hours as needed for muscle spasms.  Palliative Prophylaxis:  Constipation- bisacodyl 5 mg daily prn Nausea- compazine 5-10 mg every 6 hours prn Anxiety-hydroxyzine 10 mg as needed three times a day Insomnia- trazadone 50 mg at bedtime as needed  Additional Recommendations (Limitations, Scope, Preferences): Encourage self care as much as possible. Continue PT/OT  Psycho-social/Spiritual:  Desire for further Chaplaincy support: no Additional Recommendations:   Nurse Vonna Kotyk will discuss with pharmacy increasing par levels of hydromorphone on the unit to ensure there is availability when patient needs it. Palliative medicine will follow to evaluate if current regimen is improving pain.  Await neuropsych consult-scheduled Monday   Prognosis: Guarded   Discharge Planning: Home after rehab  Review of Systems  Constitutional:  Positive for malaise/fatigue and weight loss.  HENT: Negative.    Eyes: Negative.   Respiratory: Negative.    Cardiovascular: Negative.   Gastrointestinal: Negative.   Genitourinary: Negative.   Musculoskeletal:  Positive for back pain and falls.  Skin: Negative.   Neurological:  Positive for dizziness, weakness and headaches.  Endo/Heme/Allergies: Negative.   Psychiatric/Behavioral:  The patient is nervous/anxious.        05/15/2022    5:48 PM 05/15/2022    1:41 PM 05/15/2022    4:58 AM  Vitals with BMI  Systolic [phone]  Diastolic 69 69 67  Pulse 69 72 85    Physical Exam Constitutional:      Appearance: He is ill-appearing.  HENT:     Mouth/Throat:     Mouth: Mucous membranes are  moist.  Cardiovascular:     Rate and Rhythm: Normal rate and regular rhythm.  Pulmonary:     Effort: Pulmonary effort is normal.  Skin:    General: Skin is warm and dry.     Capillary Refill: Capillary refill takes less than 2 seconds.  Neurological:     Mental Status: He is alert and oriented to person, place, and time.  Psychiatric:        Mood and Affect: Mood normal.        Thought Content: Thought content normal.        Judgment: Judgment normal.     PPS: 50%    Thank you for the opportunity to participate in the care of this patient and family.    Total Time: 70 minutes Greater than 50%  of this time was spent counseling and coordinating care related to the above assessment and plan.  Lindell Spar, NP [facility] Palliative Medicine Team Team Cell Phone: [phone] Please utilize secure chat with additional questions, if there is no response within 30 minutes please call the above phone number  Palliative Medicine Team providers are available by phone from 7am to 7pm daily and can be reached through the team cell phone.  Should this patient require assistance outside of these hours, please call the patient's attending physician.

## 2022-05-15 NOTE — Progress Notes (Signed)
Physical Therapy Session Note  Patient Details  Name: [name] MRN: [id number] Date of Birth: [birth year]  Today's Date: 05/15/2022 PT Individual Time: 0915-1000 PT Individual Time Calculation (min): 45 min   Short Term Goals: Week 1:  PT Short Term Goal 1 (Week 1): STG=LTG secondary to ELOS  Skilled Therapeutic Interventions/Progress Updates:  Patient greeted semi-reclined in bed and agreeable to PT treatment session. Patient transitioned from semi-reclined to sitting EOB independently. Patient performed stand pivot transfer from bed to wc without AD and CGA for safety.   Patient propelled manual wheelchair ~100' with B UE and supv- VC for improved symmetry and propulsion technique. Wc mobility performed in order to improve endurance/activity tolerance and general coordination.   Patient completed Berg Balance Assessment in order to assess fall risk- Please see details below. Patient demonstrates increased fall risk as noted by score of 44/56 on Berg Balance Scale.  (<36= high risk for falls, close to 100%; 37-45 significant >80%; 46-51 moderate >50%; 52-55 lower >25%)  Patient ambulated from day room rehab gym to ortho gym (>150') with RW and CGA/SBA for safety- VC for improved postural extension and increased BOS. No LOB noted.   Patient performed sit/stand with overhead press with 5.5# medicine ball with CGA- Patient completed x10 and required extended seated rest break secondary to notable fatigue and SOB.   Patient ambulated from ortho gym to his room without AD and CGA for safety- No LOB noted and no reports or s/s of orthostatic hypotension.   Patient performed various sit/stands and transfers with RW and SBA- Minor VC for proper hand placement.   Patient returned to his room reclined in bedside recliner with chair alarm on, call bell within reach, tray table in front and all needs met.   Therapy Documentation Precautions:  Precautions Precautions: Fall Precaution  Comments: orthostatic, monitor HR Other Brace: Has abdominal binder and TEDs Restrictions Weight Bearing Restrictions: No  Balance: Balance Balance Assessed: Yes Standardized Balance Assessment Standardized Balance Assessment: Berg Balance Test Berg Balance Test Sit to Stand: Able to stand without using hands and stabilize independently Standing Unsupported: Able to stand safely 2 minutes Sitting with Back Unsupported but Feet Supported on Floor or Stool: Able to sit safely and securely 2 minutes Stand to Sit: Sits safely with minimal use of hands Transfers: Able to transfer safely, minor use of hands Standing Unsupported with Eyes Closed: Able to stand 10 seconds with supervision Standing Ubsupported with Feet Together: Able to place feet together independently and stand for 1 minute with supervision From Standing, Reach Forward with Outstretched Arm: Can reach forward >12 cm safely (5") From Standing Position, Pick up Object from Floor: Able to pick up shoe, needs supervision From Standing Position, Turn to Look Behind Over each Shoulder: Looks behind one side only/other side shows less weight shift Turn 360 Degrees: Needs close supervision or verbal cueing Standing Unsupported, Alternately Place Feet on Step/Stool: Able to complete 4 steps without aid or supervision Standing Unsupported, One Foot in Front: Able to plae foot ahead of the other independently and hold 30 seconds Standing on One Leg: Able to lift leg independently and hold 5-10 seconds Total Score: 44  Therapy/Group: Individual Therapy  Adreona Brand 05/15/2022, 9:44 AM

## 2022-05-15 NOTE — Progress Notes (Signed)
Speech Language Pathology Daily Session Note  Patient Details  Name: [name] MRN: [id number] Date of Birth: 06-Sep-1955  Today's Date: 05/15/2022 SLP Individual Time: 1400-1445 SLP Individual Time Calculation (min): 45 min  Short Term Goals: Week 1: SLP Short Term Goal 1 (Week 1): STG=LTG due to ELOS  Skilled Therapeutic Interventions:Skilled ST services focused on cognitive skills. SLP facilitated education on memory strategies providing handout packet. Pt supported he enjoys witting down information for recall but recently has difficulty witting due to hand tremors. SLP educated pt on text-to-talk and use of note app on iphone. Pt returned demonstration of text-to-talk and navigating app with mod A fade to min A verbal cues. SLP began recall task (spot it), but ended as well as the session (missing 15 minutes) due to pt's request to communicate with in coming palliative nurse. Pt was left in room with family and staff call bell within reach and bed alarm set. SLP recommends to continue skilled services.     Pain Pain Assessment Pain Scale: 0-10 Pain Score: 0-No pain Pain Type: Acute pain Pain Location: Abdomen Pain Orientation: Right Pain Descriptors / Indicators: Aching;Sharp Pain Frequency: Constant Pain Onset: On-going Patients Stated Pain Goal: 2 Pain Intervention(s): Medication (See eMAR) Multiple Pain Sites: No  Therapy/Group: Individual Therapy  [name]  [facility] 05/15/2022, 3:22 PM

## 2022-05-16 DIAGNOSIS — I951 Orthostatic hypotension: Secondary | ICD-10-CM | POA: Diagnosis not present

## 2022-05-16 DIAGNOSIS — C7A8 Other malignant neuroendocrine tumors: Secondary | ICD-10-CM

## 2022-05-16 DIAGNOSIS — R52 Pain, unspecified: Secondary | ICD-10-CM | POA: Diagnosis not present

## 2022-05-16 DIAGNOSIS — C7931 Secondary malignant neoplasm of brain: Secondary | ICD-10-CM

## 2022-05-16 DIAGNOSIS — Z515 Encounter for palliative care: Secondary | ICD-10-CM | POA: Diagnosis not present

## 2022-05-16 DIAGNOSIS — Z7189 Other specified counseling: Secondary | ICD-10-CM | POA: Diagnosis not present

## 2022-05-16 NOTE — Progress Notes (Signed)
  Daily Progress Note   Patient Name: [name]       Date: 05/16/2022 DOB: [birth year]  Age: 66 y.o. MRN#: [id number] Attending Physician: Izora Ribas, MD Primary Care Physician: [name], MD Admit Date: 05/13/2022 Length of Stay: 3 days  Reason for Consultation/Follow-up: {Reason for Consult:23484}  HPI/Patient Profile:  ***  Subjective:   Subjective: Chart Reviewed. Updates received. Patient Assessed. Created space and opportunity for patient  and family to explore thoughts and feelings regarding current medical situation.  Today's Discussion: ***  Review of Systems  Objective:   Vital Signs:  BP 122/85 (BP Location: Left Arm)   Pulse 82   Temp 98.7 F (37.1 C) (Oral)   Resp 20   Ht 5' 9.02" (1.753 m)   Wt 59.5 kg   SpO2 100%   BMI 19.36 kg/m   Physical Exam: Physical Exam  Palliative Assessment/Data: ***   Assessment & Plan:   Impression: Present on Admission:  Hypotension, postural  ***  SUMMARY OF RECOMMENDATIONS   ***  Symptom Management:  ***  Code Status: {Palliative Code status:23503}  Prognosis: {Palliative Care Prognosis:23504}  Discharge Planning: {Palliative dispostion:23505}  Discussed with: ***  Thank you for allowing [name] to participate in the care of JERAMINE DELIS PMT will continue to support holistically.  Time Total: ***  Visit consisted of counseling and education dealing with the complex and emotionally intense issues of symptom management and palliative care in the setting of serious and potentially life-threatening illness. Greater than 50%  of this time was spent counseling and coordinating care related to the above assessment and plan.  Walden Field, NP Palliative Medicine Team  Team Phone # [phone] (Nights/Weekends)  04/28/2021, 8:17 AM

## 2022-05-16 NOTE — Progress Notes (Signed)
PROGRESS NOTE   Subjective/Complaints:  Patient seen at bedside. No events overnight. C/o intermittent anxiety today, along with increased nausea not associated with oral intakes. Per daughter, atarax made him confused/lethargic, however patient states it has been helpful.   Palliative assessment 9/16, per note no change in medications or plan at this time, will follow.  LBM 9/14, large.      05/16/2022    4:15 AM 05/15/2022    5:48 PM 05/15/2022    1:41 PM  Vitals with BMI  Systolic [phone]  Diastolic 85 69 69  Pulse 82 69 72     ROS: Denies fevers, chills, abdominal pain, diarrhea, SOB, cough, chest pain, new weakness or paraesthesias.    Objective:   No results found. Recent Labs    05/14/22 0335  WBC 3.9*  HGB 10.2*  HCT 31.7*  PLT 197   Recent Labs    05/14/22 0335  NA 140  K 3.6  CL 106  CO2 26  GLUCOSE 95  BUN 11  CREATININE 0.80  CALCIUM 9.5    Intake/Output Summary (Last 24 hours) at 05/16/2022 0847 Last data filed at 05/16/2022 0801 Gross per 24 hour  Intake 517 ml  Output --  Net 517 ml        Physical Exam: Vital Signs reviewed as above.  Constitutional: No apparent distress. Appropriate appearance for age.  HENT: Atraumatic, normocephalic. Temporal wasting.  Eyes: PERRL. No apparent EOM deficits.  Cardiovascular: RRR, no murmurs/rub/gallops. No Edema.   Respiratory: CTAB. No rales, rhonchi, or wheezing. On RA.  Abdomen: + bowel sounds, normoactive, nondistended + binder Skin: C/D/I. No apparent lesions.  MSK: Moving all 4 limbs in bed  Neurologic exam:  Cognition: AAO to person, place, time and event.   PE from prior encounter: Gen: no distress, normal appearing HENT: No JVD. Neck Supple. Trachea midline. Atraumatic. +temporal wasting Eyes: PERRLA. EOMI. Visual fields grossly intact.  Cardiovascular: RRR, no murmurs/rub/gallops. No Edema. Peripheral pulses 2+  Respiratory:  CTAB. No rales, rhonchi, or wheezing. On RA.  Abdomen: + bowel sounds, normoactive. No distention or tenderness. + abdominal binder GU: Not examined.  Skin: C/D/I. No apparent lesions. MSK:      No apparent deformity.      Strength:                RUE: 4/5 SA, 4/5 EF, 4/5 EE, 5/5 WE, 5/5 FF, 5/5 FA                 LUE: 5/5 SA, 5/5 EF, 5/5 EE, 5/5 WE, 5/5 FF, 5/5 FA                 RLE: 4/5 HF, 4/5 KE, 5/5 DF, 5/5 EHL, 5/5 PF                 LLE:  5/5 HF, 5/5 KE, 5/5 DF, 5/5 EHL, 5/5 PF    Neurologic exam:  Cognition: AAO to person, place, time and event. Cannot ad change, can perform serial 7s. Cannot spell world backwards. Language: Fluent, No substitutions or neoglisms. No dysarthria. Names 3/3 objects correctly.  Memory: Recalls 1/3 objects at 5  minutes.   Insight: Good insight into current condition.  Mood: Appropriate affect, depressed mood, tearful Sensation: To light touch intact in BL UEs and LEs with exception of right foot plantar, left 5th digit Reflexes: 2+ in BL UE and LEs. Negative Hoffman's and babinski signs bilaterally.  CN: 2-12 grossly intact.  Coordination: + BL UE intention tremors. No ataxia on FTN, HTS bilaterally.  Spasticity: MAS 0 in all extremities    Assessment/Plan: 1. Functional deficits which require 3+ hours per day of interdisciplinary therapy in a comprehensive inpatient rehab setting. Physiatrist is providing close team supervision and 24 hour management of active medical problems listed below. Physiatrist and rehab team continue to assess barriers to discharge/monitor patient progress toward functional and medical goals  Care Tool:  Bathing    Body parts bathed by patient: Right arm, Left arm, Chest, Abdomen, Front perineal area, Buttocks, Right upper leg, Left upper leg, Face   Body parts bathed by helper: Right lower leg, Left lower leg     Bathing assist Assist Level: Minimal Assistance - Patient > 75%     Upper Body  Dressing/Undressing Upper body dressing   What is the patient wearing?: Pull over shirt    Upper body assist Assist Level: Set up assist    Lower Body Dressing/Undressing Lower body dressing      What is the patient wearing?: Pants     Lower body assist Assist for lower body dressing: Minimal Assistance - Patient > 75%     Toileting Toileting    Toileting assist Assist for toileting: Contact Guard/Touching assist     Transfers Chair/bed transfer  Transfers assist     Chair/bed transfer assist level: Contact Guard/Touching assist     Locomotion Ambulation   Ambulation assist      Assist level: Minimal Assistance - Patient > 75% Assistive device: Walker-rolling Max distance: 150   Walk 10 feet activity   Assist     Assist level: Minimal Assistance - Patient > 75% Assistive device: Walker-rolling   Walk 50 feet activity   Assist    Assist level: Minimal Assistance - Patient > 75% Assistive device: Walker-rolling    Walk 150 feet activity   Assist    Assist level: Minimal Assistance - Patient > 75% Assistive device: Walker-rolling    Walk 10 feet on uneven surface  activity   Assist     Assist level: Minimal Assistance - Patient > 75% Assistive device: Chemical engineer     Assist Is the patient using a wheelchair?: No             Wheelchair 50 feet with 2 turns activity    Assist            Wheelchair 150 feet activity     Assist          Blood pressure 122/85, pulse 82, temperature 98.7 F (37.1 C), temperature source Oral, resp. rate 20, height 5' 9.02" (1.753 m), weight 59.5 kg, SpO2 100 %.  Medical Problem List and Plan: 1. 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  Antithrombotics: -DVT/anticoagulation:  Pharmaceutical: Xarelto             -antiplatelet therapy: none   3. 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 Tylenol, Robaxin             - MS Contin 60 mg BID - home dose             - Dilaudid 4 mg q 4 hours as needed -> increased to home dose 4/8 mg Q4H PRN             - Pain meds managed by cancer center pain management; will need to inform them of any changes at discharge   4. Mood/Behavior/Sleep: LCSW to evaluate and provide emotional support             -antipsychotic agents: n/a             -depression: Cymbalta held due to hypotension; once improved, patient may benefit from change to Mirtazepine for depression, sleep, and appetite. Do appreciate symptoms of anxiety may be more impactful. Will monitor for now.              - anxiety: hydroxyzine 10 mg PRN; Ativan held due to hypotension             - Added Trazadone 50 mg QHS PRN for insomnia             - Neuropsych consult placed given cognitive deficits, anxiety, and depression s/p rad/chemotherapy for brain mets - on schedule for Monday   5. Neuropsych/cognition: This patient is capable of making decisions on his own behalf.           - SLP consult placed    6. Skin/Wound Care: routine skin care checks   7. Malnutrition (endorses 70+ lb unintentional weight loss in <1 year): routine Is and Os and follow-up chemistries         - Prealbumin low, discussed with patient, encouraged choosing high protein foods.             - Difficulty with POs due to nausea; PRN compazine usually helpful, less so today  8: Large cell neuroendocrine carcinoma with brain, bone metastasis:             -clinical trial med held during admission   9: Orthostatic hypotension: continue midodrine 10 mg TID             -placed order for smaller abdominal binder.              - abdominal binder and TED only  during the daytime - changed to when up to bedside or OOB due to discomfort. Reinforced not needed in bed 9/17.              - +/- Mestinon per cardiology; orthostatics improving, hold off for now             - no beta blockers 10: CAD s/p CABG 2017: Stable.  Currently off beta-blocker   11: Paroxysmal atrial fibrillation: continue Xarelto             -no BB due to significant orthostatic hypotension   12: Anemia, chronic: follow-up CBC   13. Screening for vitamin D deficiency: add vitamin D level to today's labs   14: Code status: DNR/DNI - following with palliative     LOS: 3 days A FACE TO [place] 05/16/2022, 8:47 AM

## 2022-05-16 NOTE — Discharge Instructions (Addendum)
Inpatient Rehab Discharge Instructions  KAREEN HITSMAN Discharge date and time: 05/24/2022   Activities/Precautions/ Functional Status: Activity: no lifting, driving, or strenuous exercise until cleared by MD Diet: cardiac diet Wound Care: none needed Functional status:  ___ No restrictions     ___ Walk up steps independently ___ 24/7 supervision/assistance   ___ Walk up steps with assistance __x_ Intermittent supervision/assistance  ___ Bathe/dress independently ___ Walk with walker     ___ Bathe/dress with assistance ___ Walk Independently    ___ Shower independently ___ Walk with assistance    _x__ Shower with assistance __x_ No alcohol     ___ Return to work/school ________  COMMUNITY REFERRALS UPON DISCHARGE:    Outpatient: PT                 Agency: [facility] Phone: [phone]              Appointment Date/Time: TBD    Special Instructions:  No driving, alcohol consumption or tobacco use.  Caution using Robaxin as this can lower BP along with narcotics.  My questions have been answered and I understand these instructions. I will adhere to these goals and the provided educational materials after my discharge from the hospital.  Patient/Caregiver Signature _______________________________ Date __________  Clinician Signature _______________________________________ Date __________  Please bring this form and your medication list with you to all your follow-up doctor's appointments.     Information on my medicine - XARELTO (Rivaroxaban)  This medication education was reviewed with me or my healthcare representative as part of my discharge preparation.  The pharmacist that spoke with me during my hospital stay was:    Why was Xarelto prescribed for you? Xarelto was prescribed for you to reduce the risk of a blood clot forming that can cause a stroke if you have a medical condition called atrial fibrillation (a type of irregular  heartbeat).  What do you need to know about xarelto ? Take your Xarelto ONCE DAILY at the same time every day with your evening meal. If you have difficulty swallowing the tablet whole, you may crush it and mix in applesauce just prior to taking your dose.  Take Xarelto exactly as prescribed by your doctor and DO NOT stop taking Xarelto without talking to the doctor who prescribed the medication.  Stopping without other stroke prevention medication to take the place of Xarelto may increase your risk of developing a clot that causes a stroke.  Refill your prescription before you run out.  After discharge, you should have regular check-up appointments with your healthcare provider that is prescribing your Xarelto.  In the future your dose may need to be changed if your kidney function or weight changes by a significant amount.  What do you do if you miss a dose? If you are taking Xarelto ONCE DAILY and you miss a dose, take it as soon as you remember on the same day then continue your regularly scheduled once daily regimen the next day. Do not take two doses of Xarelto at the same time or on the same day.   Important Safety Information A possible side effect of Xarelto is bleeding. You should call your healthcare provider right away if you experience any of the following: Bleeding from an injury or your nose that does not stop. Unusual colored urine (red or dark brown) or unusual colored stools (red or black). Unusual bruising for unknown reasons. A serious fall or if you hit your head (  even if there is no bleeding).  Some medicines may interact with Xarelto and might increase your risk of bleeding while on Xarelto. To help avoid this, consult your healthcare provider or pharmacist prior to using any new prescription or non-prescription medications, including herbals, vitamins, non-steroidal anti-inflammatory drugs (NSAIDs) and supplements.  This website has more information on  Xarelto: [email or web address]

## 2022-05-17 DIAGNOSIS — Z7189 Other specified counseling: Secondary | ICD-10-CM | POA: Diagnosis not present

## 2022-05-17 DIAGNOSIS — Z515 Encounter for palliative care: Secondary | ICD-10-CM | POA: Diagnosis not present

## 2022-05-17 DIAGNOSIS — I951 Orthostatic hypotension: Secondary | ICD-10-CM | POA: Diagnosis not present

## 2022-05-17 DIAGNOSIS — C7A8 Other malignant neuroendocrine tumors: Secondary | ICD-10-CM | POA: Diagnosis not present

## 2022-05-17 DIAGNOSIS — Z66 Do not resuscitate: Secondary | ICD-10-CM | POA: Diagnosis not present

## 2022-05-17 LAB — CBC
HCT: 32.4 % — ABNORMAL LOW (ref 39.0–52.0)
Hemoglobin: 10.3 g/dL — ABNORMAL LOW (ref 13.0–17.0)
MCH: 29.4 pg (ref 26.0–34.0)
MCHC: 31.8 g/dL (ref 30.0–36.0)
MCV: 92.6 fL (ref 80.0–100.0)
Platelets: 208 10*3/uL (ref 150–400)
RBC: 3.5 MIL/uL — ABNORMAL LOW (ref 4.22–5.81)
RDW: 13.9 % (ref 11.5–15.5)
WBC: 4.3 10*3/uL (ref 4.0–10.5)
nRBC: 0 % (ref 0.0–0.2)

## 2022-05-17 LAB — URINALYSIS, ROUTINE W REFLEX MICROSCOPIC
Bilirubin Urine: NEGATIVE
Glucose, UA: NEGATIVE mg/dL
Hgb urine dipstick: NEGATIVE
Ketones, ur: NEGATIVE mg/dL
Nitrite: NEGATIVE
Protein, ur: NEGATIVE mg/dL
Specific Gravity, Urine: 1.02 (ref 1.005–1.030)
pH: 6 (ref 5.0–8.0)

## 2022-05-17 LAB — BASIC METABOLIC PANEL
Anion gap: 10 (ref 5–15)
BUN: 9 mg/dL (ref 8–23)
CO2: 24 mmol/L (ref 22–32)
Calcium: 9.4 mg/dL (ref 8.9–10.3)
Chloride: 104 mmol/L (ref 98–111)
Creatinine, Ser: 0.76 mg/dL (ref 0.61–1.24)
GFR, Estimated: >60 mL/min (ref >60)
Glucose, Bld: 110 mg/dL — ABNORMAL HIGH (ref 70–99)
Potassium: 3.3 mmol/L — ABNORMAL LOW (ref 3.5–5.1)
Sodium: 138 mmol/L (ref 135–145)

## 2022-05-17 MED ORDER — HYDROMORPHONE HCL 2 MG PO TABS
8.0000 mg | ORAL_TABLET | ORAL | Status: DC | PRN
  Administered 2022-05-18 – 2022-05-24 (×11): 8 mg via ORAL
  Filled 2022-05-17 (×11): qty 4

## 2022-05-17 MED ORDER — HYDROMORPHONE HCL 1 MG/ML IJ SOLN: 1.0000 mg | INTRAMUSCULAR | Status: DC | PRN

## 2022-05-17 MED ORDER — MELOXICAM 7.5 MG PO TABS
15.0000 mg | ORAL_TABLET | Freq: Every day | ORAL | Status: DC
  Administered 2022-05-17 – 2022-05-24 (×8): 15 mg via ORAL
  Filled 2022-05-17 (×8): qty 2

## 2022-05-17 MED ORDER — HYDROMORPHONE BOLUS VIA INFUSION: 1.0000 mg | Freq: Four times a day (QID) | INTRAVENOUS | Status: DC | PRN

## 2022-05-17 MED ORDER — POTASSIUM CHLORIDE 20 MEQ PO PACK
40.0000 meq | PACK | Freq: Once | ORAL | Status: AC
  Administered 2022-05-17: 40 meq via ORAL

## 2022-05-17 MED ORDER — HYDROMORPHONE HCL 1 MG/ML IJ SOLN
1.0000 mg | Freq: Four times a day (QID) | INTRAMUSCULAR | Status: DC | PRN
  Administered 2022-05-17: 1 mg via INTRAVENOUS
  Filled 2022-05-17: qty 1

## 2022-05-17 NOTE — Progress Notes (Signed)
  Daily Progress Note   Patient Name: [name]       Date: 05/17/2022 DOB: 27-Jul-1956  Age: 66 y.o. MRN#: [id number] Attending Physician: Izora Ribas, MD Primary Care Physician: [name], MD Admit Date: 05/13/2022 Length of Stay: 4 days  Reason for Consultation/Follow-up: {Reason for Consult:23484}  HPI/Patient Profile:  ***  Subjective:   Subjective: Chart Reviewed. Updates received. Patient Assessed. Created space and opportunity for patient  and family to explore thoughts and feelings regarding current medical situation.  Today's Discussion: ***  Review of Systems  Objective:   Vital Signs:  BP 137/89 (BP Location: Right Arm)   Pulse 97   Temp 98.8 F (37.1 C) (Oral)   Resp 18   Ht 5' 9.02" (1.753 m)   Wt 59.5 kg   SpO2 99%   BMI 19.36 kg/m   Physical Exam: Physical Exam  Palliative Assessment/Data: ***   Assessment & Plan:   Impression: Present on Admission:  Hypotension, postural  ***  SUMMARY OF RECOMMENDATIONS   ***  Symptom Management:  ***  Code Status: {Palliative Code status:23503}  Prognosis: {Palliative Care Prognosis:23504}  Discharge Planning: {Palliative dispostion:23505}  Discussed with: ***  Thank you for allowing [name] to participate in the care of MANLY NESTLE PMT will continue to support holistically.  Time Total: ***  Visit consisted of counseling and education dealing with the complex and emotionally intense issues of symptom management and palliative care in the setting of serious and potentially life-threatening illness. Greater than 50%  of this time was spent counseling and coordinating care related to the above assessment and plan.  Walden Field, NP Palliative Medicine Team  Team Phone # [phone] (Nights/Weekends)  04/28/2021, 8:17 AM

## 2022-05-17 NOTE — Progress Notes (Signed)
Occupational Therapy Session Note  Patient Details  Name: [name] MRN: [id number] Date of Birth: Sep 22, 1955  Today's Date: 05/17/2022 OT Individual Time: 5361-4431 OT Individual Time Calculation (min): 45 min    Short Term Goals: Week 1:  OT Short Term Goal 1 (Week 1): STG = LTG 2/2 ELOS  Skilled Therapeutic Interventions/Progress Updates:    Pt resting in recliner upon arrival. OT intervention with focus on sit<>stand and standing balance to increase independence with BADLs.  BP 125/85 seated 100/64 standing 121/74 standing 2 mins Subsequent standing BP with no significant changes in BP. Pt asymptomatic.  Sit<>stand X 6 with supervision.   Pt reports he did not sleep well during the night and is fatigued this morning.  Pt remained in recliner with seat alarm activated. All needs within reach.   Therapy Documentation Precautions:  Precautions Precautions: Fall Precaution Comments: orthostatic, monitor HR Other Brace: Has abdominal binder and TEDs Restrictions Weight Bearing Restrictions: No   Pain: Pt c/o 5/10 back pain; activity and repositioned Therapy/Group: Individual Therapy  Leroy Libman 05/17/2022, 12:04 PM

## 2022-05-17 NOTE — Progress Notes (Signed)
PROGRESS NOTE   Subjective/Complaints: Patient with continued pain- he has not tried meloxicam before.  Daughters and nursing note increased confusion- UA/UC ordered and newer sedating medications d/ced     05/17/2022    1:04 PM 05/17/2022    3:52 AM 05/16/2022    7:24 PM  Vitals with BMI  Systolic 229 798 921  Diastolic 85 89 79  Pulse 96 97 88     ROS: Denies fevers, chills, abdominal pain, diarrhea, SOB, cough, chest pain, new weakness or paraesthesias.  +pain from metastases  Objective:   No results found. Recent Labs    05/17/22 0406  WBC 4.3  HGB 10.3*  HCT 32.4*  PLT 208   Recent Labs    05/17/22 0406  NA 138  K 3.3*  CL 104  CO2 24  GLUCOSE 110*  BUN 9  CREATININE 0.76  CALCIUM 9.4    Intake/Output Summary (Last 24 hours) at 05/17/2022 1718 Last data filed at 05/17/2022 1717 Gross per 24 hour  Intake 720 ml  Output 250 ml  Net 470 ml        Physical Exam:  Gen: no distress, normal appearing HENT: No JVD. Neck Supple. Trachea midline. Atraumatic. +temporal wasting Eyes: PERRLA. EOMI. Visual fields grossly intact.  Cardiovascular: RRR, no murmurs/rub/gallops. No Edema. Peripheral pulses 2+  Respiratory: CTAB. No rales, rhonchi, or wheezing. On RA.  Abdomen: + bowel sounds, normoactive. No distention or tenderness. + abdominal binder GU: Not examined.  Skin: C/D/I. No apparent lesions. MSK:      No apparent deformity.      Strength:                RUE: 4/5 SA, 4/5 EF, 4/5 EE, 5/5 WE, 5/5 FF, 5/5 FA                 LUE: 5/5 SA, 5/5 EF, 5/5 EE, 5/5 WE, 5/5 FF, 5/5 FA                 RLE: 4/5 HF, 4/5 KE, 5/5 DF, 5/5 EHL, 5/5 PF                 LLE:  5/5 HF, 5/5 KE, 5/5 DF, 5/5 EHL, 5/5 PF    Neurologic exam:  Cognition: AAO to person, place, time and event. Cannot ad change, can perform serial 7s. Cannot spell world backwards. Language: Fluent, No substitutions or neoglisms. No dysarthria.  Names 3/3 objects correctly.  Memory: Recalls 1/3 objects at 5 minutes.   Insight: Good insight into current condition.  Mood: Appropriate affect, positive mood Sensation: To light touch intact in BL UEs and LEs with exception of right foot plantar, left 5th digit Reflexes: 2+ in BL UE and LEs. Negative Hoffman's and babinski signs bilaterally.  CN: 2-12 grossly intact.  Coordination: + BL UE intention tremors. No ataxia on FTN, HTS bilaterally.  Spasticity: MAS 0 in all extremities    Assessment/Plan: 1. Functional deficits which require 3+ hours per day of interdisciplinary therapy in a comprehensive inpatient rehab setting. Physiatrist is providing close team supervision and 24 hour management of active medical problems listed below. Physiatrist and rehab team  continue to assess barriers to discharge/monitor patient progress toward functional and medical goals  Care Tool:  Bathing    Body parts bathed by patient: Right arm, Left arm, Chest, Abdomen, Front perineal area, Buttocks, Right upper leg, Left upper leg, Face   Body parts bathed by helper: Right lower leg, Left lower leg     Bathing assist Assist Level: Minimal Assistance - Patient > 75%     Upper Body Dressing/Undressing Upper body dressing   What is the patient wearing?: Pull over shirt    Upper body assist Assist Level: Set up assist    Lower Body Dressing/Undressing Lower body dressing      What is the patient wearing?: Pants     Lower body assist Assist for lower body dressing: Contact Guard/Touching assist     Toileting Toileting    Toileting assist Assist for toileting: Contact Guard/Touching assist     Transfers Chair/bed transfer  Transfers assist     Chair/bed transfer assist level: Contact Guard/Touching assist     Locomotion Ambulation   Ambulation assist      Assist level: Minimal Assistance - Patient > 75% Assistive device: Walker-rolling Max distance: 150   Walk 10 feet  activity   Assist     Assist level: Minimal Assistance - Patient > 75% Assistive device: Walker-rolling   Walk 50 feet activity   Assist    Assist level: Minimal Assistance - Patient > 75% Assistive device: Walker-rolling    Walk 150 feet activity   Assist    Assist level: Minimal Assistance - Patient > 75% Assistive device: Walker-rolling    Walk 10 feet on uneven surface  activity   Assist     Assist level: Minimal Assistance - Patient > 75% Assistive device: Chemical engineer     Assist Is the patient using a wheelchair?: No             Wheelchair 50 feet with 2 turns activity    Assist            Wheelchair 150 feet activity     Assist          Blood pressure 135/85, pulse 96, temperature 98.2 F (36.8 C), temperature source Oral, resp. rate 16, height 5' 9.02" (1.753 m), weight 59.5 kg, SpO2 100 %.  Medical Problem List and Plan: 1. 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  Antithrombotics: -DVT/anticoagulation:  Pharmaceutical: Xarelto             -antiplatelet therapy: none   3. 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 Tylenol, Robaxin             - MS Contin 60 mg BID - home dose             - Dilaudid 4 mg q 4 hours as needed -> increased to home dose 4/8 mg Q4H PRN  D/c IV diladud             -  Pain meds managed by cancer center pain management; will need to inform them of any changes at discharge   4. Anxiety: d/c hydroxyzine due to confusion             -antipsychotic agents: n/a             -depression: Cymbalta held due to hypotension; once improved, patient may  benefit from change to Mirtazepine for depression, sleep, and appetite. Do appreciate symptoms of anxiety may be more impactful. Will monitor for now.              - anxiety: hydroxyzine 10 mg PRN; Ativan held due to hypotension             - Added Trazadone 50 mg QHS PRN for insomnia             - Neuropsych consult placed given cognitive deficits, anxiety, and depression s/p rad/chemotherapy for brain mets - on schedule for Monday   5. Neuropsych/cognition: This patient is capable of making decisions on his own behalf.           - SLP consult placed    6. Skin/Wound Care: routine skin care checks   7. Malnutrition (endorses 70+ lb unintentional weight loss in <1 year): routine Is and Os and follow-up chemistries         - Prealbumin low, discussed with patient, encouraged choosing high protein foods.             - Difficulty with POs due to nausea; PRN compazine usually helpful, less so today  8: Large cell neuroendocrine carcinoma with brain, bone metastasis:             -clinical trial med held during admission   9: Orthostatic hypotension: continue midodrine 10 mg TID             -placed order for smaller abdominal binder.              - abdominal binder and TED only during the daytime - changed to when up to bedside or OOB due to discomfort. Reinforced not needed in bed 9/17.              - +/- Mestinon per cardiology; orthostatics improving, hold off for now             - no beta blockers 10: CAD s/p CABG 2017: Stable.  Currently off beta-blocker   11: Paroxysmal atrial fibrillation: continue Xarelto             -no BB due to significant orthostatic hypotension   12: Anemia, chronic: follow-up CBC   13. Screening for vitamin D deficiency: add vitamin D level to today's labs   14: Code status: DNR/DNI - following with palliative   15. Confusion: d/c trazodone and atarax, UA and UC ordered.     LOS: 4 days A FACE TO FACE EVALUATION WAS PERFORMED  [name]  [name] 05/17/2022, 5:18 PM

## 2022-05-17 NOTE — Progress Notes (Signed)
Physical Therapy Session Note  Patient Details  Name: [name] MRN: [id number] Date of Birth: 1955/11/17  Today's Date: 05/17/2022 PT Individual Time: 1415-1445 PT Individual Time Calculation (min): 30 min   Short Term Goals: Week 1:  PT Short Term Goal 1 (Week 1): STG=LTG secondary to ELOS  Skilled Therapeutic Interventions/Progress Updates:  Patient greeted sitting EOB in his room and agreeable to PT treatment session. Patient reporting increased anxiety last night and not having the best day- Therapist offered to take the patient outside in order to improve overall mood and affect. Patient with TEDs donned and therapist donned abdominal binder while sitting EOB. Patient performed sit/stand without AD and SBA for safety. Patient ambulated x10' to his wheelchair without AD and CGA- Minor LOB noted with L LE scissoring, however able to steady using stepping strategy. Patient wheeled outside for time management. While outside therapist and patient discussed all the stressors in his life, answered questions, discussed coping strategies, etc. Patient with notable frustration with word-finding throughout conversation- SLP notified and stated they will work on word-finding strategies in their next treatment session. Patient returned to his room seated EOB with bed alarm on, call bell within reach and all needs met.   Therapy Documentation Precautions:  Precautions Precautions: Fall Precaution Comments: orthostatic, monitor HR Other Brace: Has abdominal binder and TEDs Restrictions Weight Bearing Restrictions: No  Therapy/Group: Individual Therapy  Tymothy Cass 05/17/2022, 7:59 AM

## 2022-05-17 NOTE — Consult Note (Signed)
Neuropsychological Consultation   Patient:   [name]   DOB:   1956-05-22  MR Number:  [id number]  Location:  [facility] [address] CENTER [address] [place] [id number] [place] [id number] Dept: [place]: [phone]           Date of Service:   05/17/2022  Start Time:   10 AM End Time:   11 AM  Provider/Observer:  Ilean Skill, Psy.D.       Clinical Neuropsychologist       Billing Code/Service: [phone]  Chief Complaint:    [name] is a 66 year old male referred for neuropsychological consultation due to coping and adjustment issues with metastatic cancer and currently participating in an experimental regimen for treatment through [facility].  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  Patient recently presented to the emergency department at [facility] on 05/03/2022 complaining of generalized weaknesses and multiple falls for approximately the prior 3 weeks.  Patient has known metastasis to the brain and bone.  Patient has been having brain radiation and is currently on a experimental regimen for chemotherapy.  The patient is enrolled in a clinical trial at [facility] and the procedure is typically with holding therapy during hospital admission.  Neurology was consulted and he recently underwent an MRI for cervical and thoracic spine during this admission.  He has been treated for significant orthostatic hypotension and cardiology was also consulted.  Patient was admitted to comprehensive inpatient rehabilitation services secondary to dysfunction due to metastatic large cell neuroendocrine carcinoma with weakness and falls.  Reason for Service:  Patient was referred for neuropsychological consultation due to coping and adjustment issues below is the HPI for the current admission.  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  He presented to the emergency department at [facility] on 05/03/2022 complaining of generalized weakness and multiple falls for approximately 3 weeks.  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  He is s/p whole brain radiation currently on experimental chemotherapy. He is enrolled in clinical trial at [facility] and the experimental drug alirocumab is typically held during hospital admission according to trial study coordinator.  Neurology was consulted and he underwent MRI of cervical and thoracic spine this admission.  He is currently being aggressively treated for significant orthostatic hypotension and cardiology has been consulted.  Multiple medications have been held and he is on midodrine 3 times daily.  This has improved however he he still tachycardic to 140s when ambulating. His past medical history significant for coronary artery disease status post emergent CABG in 2017.  He also has a history of paroxysmal atrial fibrillation maintained on Xarelto.  Current ejection fraction is estimated at 55 to 60%. The patient requires inpatient physical medicine and rehabilitation evaluations and treatment secondary to dysfunction due to metastatic large cell neuroendocrine carcinoma.  Current Status:  Patient was awake but somewhat groggy as I entered the room.  He was laying slightly elevated in his bed.  He was aware with adequate cognition and orientation.  Patient verbalized some concerns about what was going to happen going forward.  The patient was unsure whether [facility] would do the planned treatment this coming Thursday and whether or not he would be discharged from our unit and have that done or whether he was still a candidate to continue with that  treatment.  His attending physician on the unit was to get in touch with [facility] today to address this.  Patient verbalized desire to stay on the unit if they were not going to continue with his therapies  at [facility] so we could try to gain greater strength as he was aware of his multiple falls over the past several weeks.  Patient acknowledged that he has been dealing with some anxiety and coping issues and was dealing with anxiety before but these were felt to be appropriate given what he is going through medically.  Behavioral Observation: [name]  presents as a 66 y.o.-year-old Right handed Caucasian Male who appeared his stated age. his dress was Appropriate and he was Well Groomed and his manners were Appropriate to the situation.  his participation was indicative of Appropriate and Inattentive behaviors.  There were physical disabilities noted.  he displayed an appropriate level of cooperation and motivation.     Interactions:    Active Appropriate and Drowsy  Attention:   abnormal and attention span appeared shorter than expected for age  Memory:   within normal limits; recent and remote memory intact  Visuo-spatial:  not examined  Speech (Volume):  low  Speech:   normal; normal  Thought Process:  Coherent and Relevant  Though Content:  WNL; not suicidal and not homicidal  Orientation:   person, place, time/date, and situation  Judgment:   Good  Planning:   Fair  Affect:    Appropriate  Mood:    Dysphoric  Insight:   Good  Intelligence:   high   Medical History:   Past Medical History:  Diagnosis Date   Anemia    Anxiety    Arthritis    Basal cell carcinoma (BCC) of forehead    CAD (coronary artery disease)    a. 10/2015 ant STEMI >> LHC with 3 v CAD; oLAD tx with POBA >> emergent CABG. b. Multiple evals since that time, early graft failure of SVG-RCA by cath 03/2016. c. 2/19 PCI/DES x1 to pRCA, normal EF.   Carotid artery disease ([place])    a. 40-59% BICA 02/2018.   Depression    Dyspnea    Ectopic atrial tachycardia (HCC)    Esophageal reflux    eosinophil esophagitis   Family history of adverse reaction to anesthesia    "sister has PONV" (06/21/2017)    Former tobacco use    Gout    Hepatitis C    "treated and cured" (06/21/2017)   High cholesterol    History of blood transfusion    History of kidney stones    Hypertension    Ischemic cardiomyopathy    a. EF 25-30% at intraop TEE 4/17  //  b. Limited Echo 5/17 - EF 45-50%, mild ant HK. c. EF 55-65% by cath 09/2017.   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[place])    Migraine    "3-4/yr" (06/21/2017)   Myocardial infarction ([place]) 10/2015   Palpitations    Pneumonia    Sinus bradycardia    a. HR dropping into 40s in 02/2016 -> BB reduced.   Stroke [facility]) 10/2016   "small one; sometimes my memory/cognitive issues" (06/21/2017)   Symptomatic hypotension    a. 02/2016 ER visit -> meds reduced.   Syncope    Wears dentures    Wears glasses          Patient Active Problem List   Diagnosis Date Noted   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[place])  Hypotension, postural 05/13/2022   Orthostatic hypotension    Generalized weakness 05/04/2022   Falls 05/04/2022   Spells of trembling 27/78/2423   Acute metabolic encephalopathy 53/61/4431   AMS (altered mental status) 03/14/2022   Blurry vision, bilateral 10/08/2021   Leukopenia 10/08/2021   Wound of back 10/06/2021   Hyponatremia 10/03/2021   Cancer-related breakthrough pain 10/02/2021   Encounter for antineoplastic immunotherapy 09/29/2021   Cancer associated pain 09/15/2021   Constipation 09/15/2021   Right-sided chest wall pain 08/29/2021   Malnutrition of moderate degree 08/20/2021   Atrial tachycardia ([place]) 08/18/2021   Lung cancer ([place]) 07/29/2021   Hemoptysis 06/06/2021   History of pulmonary embolus (PE) 04/29/2021   Normocytic anemia 04/29/2021   Hypotension due to hypovolemia 04/28/2021   Hypomagnesemia 04/08/2021   Port-A-Cath in place 04/07/2021   Protein-calorie malnutrition, moderate ([place]) 03/25/2021   PNA (pneumonia) 03/22/2021   Community acquired pneumonia 03/22/2021   Sepsis ([place]) 03/22/2021   CKD (chronic  kidney disease), stage III ([place]) 03/22/2021   Hypoxia 02/28/2021   Dyspnea 02/28/2021   Acute renal failure superimposed on stage 3a chronic kidney disease ([place]) 02/25/2021   AF (paroxysmal atrial fibrillation) ([place]) 02/25/2021   Pleural effusion 02/25/2021   Neutropenia, drug-induced ([place]) 02/24/2021   Positive blood cultures 02/17/2021   Positive blood culture 02/16/2021   Right flank pain 02/09/2021   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[place]) 01/30/2021   Encounter for antineoplastic chemotherapy 01/29/2021   Goals of care, counseling/discussion 01/14/2021   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[place]) 01/14/2021   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[place]) 01/14/2021   Acute back pain with sciatica 01/12/2021   Allergic rhinitis 01/12/2021   Amnesia 01/12/2021   Kidney stone 01/12/2021   Sciatica 01/12/2021   Bipolar disorder ([place]) 01/12/2021   S/P lobectomy of lung 12/24/2020   Solitary lung nodule 11/28/2020   Chest pain 11/07/2019   Gastroesophageal reflux disease    Cervical radiculopathy 10/17/2018   Preoperative clearance 10/04/2018   Palpitations    Coronary artery disease involving coronary bypass graft of native heart with angina pectoris ([place])    Transient loss of consciousness 03/24/2018   Ectopic atrial tachycardia ([place]) 02/09/2018   Central chest pain 03/10/2017   Family hx-stroke 11/10/2016   Stroke-like episode ([place]) - R brain, s/p tPA 11/09/2016   Unstable angina ([place]) 09/07/2016   Claudication of both lower extremities ([place])    Pure hypercholesterolemia    Tobacco abuse disorder    CAD of autologous artery bypass graft without angina    Chest pain at rest 06/10/2016   Abnormal nuclear stress test - HIGH RISK 04/20/2016   Old MI (myocardial infarction)    Essential hypertension 02/26/2016   Ischemic cardiomyopathy 12/25/2015   Hyperlipidemia LDL goal <70 12/25/2015   Mild tobacco abuse in early remission 11/28/2015   Coronary artery  disease involving native coronary artery of native heart with angina pectoris ([place]) 11/28/2015   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   Acute MI anterior wall first episode care [facility])    Precordial chest pain 03/07/2015   Gout attack 03/07/2015   Mixed bipolar I disorder ([place]) 03/07/2015   Fibromyalgia 07/09/2014   Gout 07/09/2014   Anxiety 07/09/2014   Depression 07/09/2014   Hepatitis C 54/00/8676   Eosinophilic esophagitis 19/50/9326     Psychiatric History:  No prior psychiatric history other than some issues with anxiety and depression in the past (2015)  Family Med/Psych History:  Family History  Problem Relation Age of Onset  Lung cancer Mother    Heart Problems Father    Heart attack Father 82   Stroke Father    Heart failure Father    Heart attack Maternal Grandmother    Stroke Maternal Grandmother    Heart attack Paternal Uncle    Hypertension Brother    Autoimmune disease Neg Hx     Impression/DX:  [name] is a 66 year old male referred for neuropsychological consultation due to coping and adjustment issues with metastatic cancer and currently participating in an experimental regimen for treatment through [facility].  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  Patient recently presented to the emergency department at [facility] on 05/03/2022 complaining of generalized weaknesses and multiple falls for approximately the prior 3 weeks.  Patient has known metastasis to the brain and bone.  Patient has been having brain radiation and is currently on a experimental regimen for chemotherapy.  The patient is enrolled in a clinical trial at [facility] and the procedure is typically with holding therapy during hospital admission.  Neurology was consulted and he recently underwent an MRI for cervical and thoracic spine during this admission.  He has been treated for significant orthostatic hypotension and cardiology was also  consulted.  Patient was admitted to comprehensive inpatient rehabilitation services secondary to dysfunction due to metastatic large cell neuroendocrine carcinoma with weakness and falls. Patient was awake but somewhat groggy as I entered the room.  He was laying slightly elevated in his bed.  He was aware with adequate cognition and orientation.  Patient verbalized some concerns about what was going to happen going forward.  The patient was unsure whether [facility] would do the planned treatment this coming Thursday and whether or not he would be discharged from our unit and have that done or whether he was still a candidate to continue with that treatment.  His attending physician on the unit was to get in touch with [facility] today to address this.  Patient verbalized desire to stay on the unit if they were not going to continue with his therapies at [facility] so we could try to gain greater strength as he was aware of his multiple falls over the past several weeks.  Patient acknowledged that he has been dealing with some anxiety and coping issues and was dealing with anxiety before but these were felt to be appropriate given what he is going through medically.  Disposition/Plan:  Today we worked on coping and adjustment issues and trying to clarify his neck step as far as discharge and going back to [facility] or staying on the rehab unit and completing his rehabilitative efforts.           Electronically Signed   _______________________ Ilean Skill, Psy.D. Clinical Neuropsychologist

## 2022-05-17 NOTE — Progress Notes (Signed)
Speech Language Pathology Daily Session Note  Patient Details  Name: DAWAN FARNEY MRN: [id number] Date of Birth: [birth year]  Today's Date: 05/17/2022 SLP Individual Time: 0800-0900 SLP Individual Time Calculation (min): 60 min  Short Term Goals: Week 1: SLP Short Term Goal 1 (Week 1): STG=LTG due to ELOS  Skilled Therapeutic Interventions: Skilled ST treatment focused on cognitive goals. Pt greeted sitting EOB on arrival. Pt reported having a rough night of minimal sleep and anxiety. Complained of back discomfort in which he requested medication from RN during session. Pt independently utilized external memory aid containing information on when he is scheduled for certain medications. Pt appeared slower to process today which suspect may attribute to sleepiness/fatigue. SLP facilitated session with ongoing education on compensatory memory and communication strategies, specifically targeting talk-to-text feature on phone for ease of texting d/t tremors. Pt returned demonstration during simulated talk-to-text task with overall mod fading to min A verbal/visual and demonstration cues. Pt required moderate A fading to min A to navigate phone including entering/exiting phone and messaging app, locating "back" buttons, and backspace key. Pt and SLP developed external memory aid containing steps involved for using talk-to-text feature with overall sup-to-min A for thoroughness. Will continue efforts per pt's request. Patient was left sitting EOB with alarm activated and immediate needs within reach at end of session. Continue per current plan of care.      Pain Pain Assessment Pain Scale: 0-10 Pain Score: 5  Pain Type: Chronic pain Pain Location: Back Pain Descriptors / Indicators: Aching Pain Onset: On-going Pain Intervention(s): Medication (See eMAR);RN made aware Multiple Pain Sites: No  Therapy/Group: Individual Therapy  Patty Sermons 05/17/2022, 9:03 AM

## 2022-05-17 NOTE — Progress Notes (Signed)
Occupational Therapy Session Note  Patient Details  Name: [name] MRN: [id number] Date of Birth: [birth year]  Today's Date: 05/17/2022 OT Individual Time: 4599-7741 OT Individual Time Calculation (min): 45 min    Short Term Goals: Week 1:  OT Short Term Goal 1 (Week 1): STG = LTG 2/2 ELOS  Skilled Therapeutic Interventions/Progress Updates:  Skilled OT intervention completed with focus on emotional support, rehab progression/focus education, BP management/education, functional transfers. Pt received seated EOB, with MD present discussing progress. Pt had questions regarding the rehab teams statements of how much progress he has made when he feels differently. Therapist educated pt on the differences between his medically complex status with active cancer and clinical trial chemo vs his physical/functional mobility status. Pt expressed how it's challenging for him to understand how progress can occur with a limited life expectancy. Therapist offered emotional encouragement, as well as educated pt on purpose of neuropsych eval that was to occur after OT session, with hopes that he could further express coping strategies etc with him.  Pt indicated that he purchased a wrist/self BP cuff, with education provided about proper use and was able to manage BP readings with cues only.   Pt noted to have mild swelling in BLE ankles with TEDS not on. Education provided about use of compression socks for home use for BP management as well. Instructed pt on how to donn TEDS, but would benefit from further trial with bag method for ease. Donned Mod A for time, then shoes total A.  Increased tremor noted on LLE, with pt stating he feels it is worse today as well- MD notified.   Pt was able to donn fresh pants with CGA at the sit > stand level using RW, then ambulatory transfer with CGA to recliner in room. Pt remained upright in recliner, with chair alarm on and all needs in reach at end of  session.  BP/HR vitals during session: Sitting EOB, 116/77; asymptomatic, wrist cuff Sitting EOB, 113/86; arm cuff (per pt request to trial to determine accuracy) After ambulatory transfer to recliner/seated; 122/82; asymptomatic   Therapy Documentation Precautions:  Precautions Precautions: Fall Precaution Comments: orthostatic, monitor HR Other Brace: Has abdominal binder and TEDs Restrictions Weight Bearing Restrictions: No    Therapy/Group: Individual Therapy  Blase Mess, MS, OTR/L  05/17/2022, 12:14 PM

## 2022-05-18 DIAGNOSIS — F419 Anxiety disorder, unspecified: Secondary | ICD-10-CM

## 2022-05-18 DIAGNOSIS — I951 Orthostatic hypotension: Secondary | ICD-10-CM | POA: Diagnosis not present

## 2022-05-18 DIAGNOSIS — Z515 Encounter for palliative care: Secondary | ICD-10-CM | POA: Diagnosis not present

## 2022-05-18 DIAGNOSIS — Z66 Do not resuscitate: Secondary | ICD-10-CM | POA: Diagnosis not present

## 2022-05-18 DIAGNOSIS — Z7189 Other specified counseling: Secondary | ICD-10-CM | POA: Diagnosis not present

## 2022-05-18 LAB — URINE CULTURE: Culture: <10000 — AB

## 2022-05-18 NOTE — Progress Notes (Signed)
Unable to see patient today as he was not in his room. Discussed with nursing and confusion improved and patient's pain is stable. Updated daughter regarding his progress by phone. UC pending.

## 2022-05-18 NOTE — Progress Notes (Signed)
Physical Therapy Session Note  Patient Details  Name: [name] MRN: [id number] Date of Birth: 10/28/1955  Today's Date: 05/18/2022 PT Individual Time: 1st Treatment Session: 0915-1000; 2nd Treatment Session: 1115-1200 PT Individual Time Calculation (min): 45 min; 45 min  Short Term Goals: Week 1:  PT Short Term Goal 1 (Week 1): STG=LTG secondary to ELOS  Skilled Therapeutic Interventions/Progress Updates:  1st Treatment Session- Patient greeted sitting in bedside recliner and agreeable to PT treatment session. Patient performed sit/stand from recliner with RW and SBA for safety. Patient ambulated from room to rehab gym (>150') with RW and SBA. Patient with 1 minor LOB, however did not require steadying assistance from therapist as patient was able to appropriately utilize his stepping strategy.   While in the rehab gym, patient stood on airex foam and tossed a ball back and forth with trampoline- 3 x 20 trials completed with CGA for safety. Patient reporting increased fatigue after second trial.  Patient stood in split stance with lead foot on 5" step while reaching medicine ball in front, overhead, to the right and to the left- Performed x10 reps with each LE leading. Therapist providing CGA/MinA for improved stability. Patient reporting increased fatigue and SOB after trial requiring an extended seated rest break.   Patient wheeled back to his room secondary to reports of fatigue.   Patient performed sit/stand and ambulated to bedside recliner without an AD and CGA.   Patient returned to his room sitting in bedside recliner with call bell within reach, chair alarm on and all needs met.    2nd Treatment Session- Patient greeted sitting in bedside recliner and agreeable to PT treatment session. Patient performed sit/stand from bedside recliner and ambulated without AD to wc in room. Patient reporting increased fatigue this session compared to earlier, however agreeable to ambulating  to rehab gym.   Patient gait trained x150', x120' with RW and SBA for safety- Patient demonstrated x2 minor LOBs where he caught his toe on the floor secondary to poor foot clearance. Patient required extended seated rest break after each gait trial secondary to impaired endurance/activity tolerance.   Patient performed the Nustep 2 x 5' on level 6 with B UE/LE. Patient reporting increase in pain from 5/10 to 7/10 with Nustep mobility. Patient required extended seated rest break in between secondary to fatigue.   Patient reporting frustrations with recent changes in pain medications- Therapist looked through charge to identify rationale and educate patient on the changes that occurred. Therapist notified RN that patient would like to communicate with doctor regarding pain medication. Patient also voicing frustrating and concern around conversations had with palliative care- This was also relayed to RN.   Patient performed alternating toe taps to 5" step without UE support and CGA for safety- x20 performed total.   Patient ambulated back to his room with RW and SBA for safety- Patient demonstrated minor LOB and scissoring throughout gait trial; Patient reporting he "wasn't paying attention."   Patient returned to his room sitting in bedside recliner with chair alarm on, call bell within reach and all needs met.   Therapy Documentation Precautions:  Precautions Precautions: Fall Precaution Comments: orthostatic, monitor HR Other Brace: Has abdominal binder and TEDs Restrictions Weight Bearing Restrictions: No  Therapy/Group: Individual Therapy  [name] 05/18/2022, 7:44 AM

## 2022-05-18 NOTE — Progress Notes (Signed)
Daily Progress Note   Patient Name: [name]       Date: 05/19/2022 DOB: Feb 12, 1956  Age: 66 y.o. 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 He was found to have significant orthostatic hyptension and was evaluated by neuro and cardiology with admission 9/4 to [facility]. He was   transferred to  to [facility] for continues skilled services to maximize functional mobility, safety, and increase independence.  Subjective:   Subjective: Chart Reviewed. Updates received. Patient Assessed. Created space and opportunity for patient  and family to explore thoughts and feelings regarding current medical situation.  Today's Discussion: Today met with the patient at bedside.  We again had extensive discussion about his pain management.  Pain continues to be better managed.  Per nursing he does not appear in significant pain, although he does continue to ask for his medications on time.  He mentioned anxiety and the IV pain medication.  I again attempted to explain the pathophysiology of IV versus oral pain and anxiety medication and that oral pain and anxiety medication, when asked for an appropriate timeframe such as limb pain is moderate but not severe, may take longer to kick in but it generally lasts longer and manages the pain better for  a longer period of time.  IV pain medication seems to have early/quick onset but "burns off quicker."  After this he stated "I know my body and IV works better."    He states that the anxiety has gotten worse today.  I noted that he was on Atarax.  He has previously had a reaction to Ativan.  He is requesting stronger medication for his anxiety.  I explained that our goal is to not limit his ability to rehab.  States he has not had any issue rehabbing thus far and how to we know stronger medication will make him sleepy without trying.  I explained that it is through education and experience, pretty well-established that stronger medications such as IV benzodiazepines and IV opioids tend to make people somnolent and sleepy which would limit his goal of continued rehab for strengthening and conditioning.  I also pointed out that with the IV pain medication yesterday he did become confused for about half today.  Additionally I explained that when he discharges home he will not be able to have IV medications so we need to try to get him to appropriate regimen that can be taken at home, such as oral medications.  He notes that he has been told he may discharge on Friday.  He was asking about the plan with [facility] for his experimental chemotherapy.  I informed him that testing, testing results, and next steps with the experimental program at [facility] would be up to the [facility] providers.  He stated that the doctor who rounded on him here said otherwise.  I recommended that he ask the doctor when  they round today if they have gotten any more information from [place].  I provided emotional and general support through therapeutic listening, empathy, sharing of stories, and other techniques. I answered all questions and addressed all concerns to the best of my ability.  Review of Systems  Respiratory:  Negative for cough and shortness of breath.   Gastrointestinal:  Negative for abdominal pain, nausea and vomiting.   Musculoskeletal:        Pain primarily in the ribs, consistent with metastasis    Objective:   Vital Signs:  BP 122/73 (BP Location: Right Arm)   Pulse 91   Temp 98.2 F (36.8 C)   Resp 18   Ht 5' 9.02" (1.753 m)   Wt 59.5 kg   SpO2 99%   BMI 19.36 kg/m   Physical Exam: Physical Exam Vitals and nursing note reviewed.  Constitutional:      General: He is not in acute distress.    Appearance: He is ill-appearing.  HENT:     Head: Normocephalic and atraumatic.  Cardiovascular:     Rate and Rhythm: Normal rate.  Pulmonary:     Effort: Pulmonary effort is normal. No respiratory distress.  Abdominal:     General: Abdomen is flat.     Palpations: Abdomen is soft.  Skin:    General: Skin is warm and dry.  Neurological:     General: No focal deficit present.     Mental Status: He is alert.  Psychiatric:        Mood and Affect: Mood normal.        Behavior: Behavior normal.     Palliative Assessment/Data: 50-60%   Assessment & Plan:   Impression: Present on Admission:  Hypotension, postural  SUMMARY OF RECOMMENDATIONS   Continue current pain regimen, recommend asking for pain before pain gets severe Trial of Atarax for anxiety Continue trazodone at bedtime to help with sleep PMT will continue to follow  Symptom Management Recommendations:  Tylenol 3 25 to 650 mg every 4 hours as needed mild pain Dulcolax 5 mg daily as needed for moderate constipation Voltaren gel 2 g topical 4 times daily Dilaudid 4 mg p.o. every 4 hours as needed moderate pain STOP Dilaudid 8 mg p.o. every 4 hours as needed severe pain Dilaudid 1 mg IV q 4 hours prn severe pain Atarax 10 mg 3 times daily as needed anxiety Mobic 15 mg p.o. daily Robaxin 500 mg p.o. every 6 hours as needed muscle spasms MS Contin 60 mg p.o. every 12 hours Compazine 5 to 10 mg p.o. every 6 hours as needed nausea Trazodone 50 mg p.o. at bedtime as needed sleep Fleet enema PR once as needed severe  constipation  Code Status: DNR  Prognosis: Unable to determine  Discharge Planning: To Be Determined  Discussed with: Patient, medical team, nursing team  Thank you for allowing [name] to participate in the care of [name] PMT will continue to support holistically.  Billing based on MDM: High  Problems Addressed: One acute or chronic illness or injury that poses a threat to life or bodily function  Amount and/or Complexity of Data: Category 3:Discussion of management or test interpretation with external physician/other qualified health care professional/appropriate source (not separately reported)  Risks: Parenteral controlled substances   Walden Field, NP Palliative Medicine Team  Team Phone # [phone] (Nights/Weekends)  04/28/2021, 8:17 AM

## 2022-05-18 NOTE — Discharge Summary (Addendum)
Physician Discharge Summary  Patient ID: [name] MRN: [id number] DOB/AGE: [birth year] 66 y.o.  Admit date: 05/13/2022 Discharge date: 05/24/2022  Discharge Diagnoses:  Principal Problem:   Hypotension, postural Active Problems:   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HCC) Anxiety  Depression Malnutrition Orthostatic hypotension Paroxysmal atrial fibrillation CAD Metastatic bone pain Persistent nausea   Discharged Condition: fair  Significant Diagnostic Studies: DG Abd 2 Views  Result Date: 05/10/2022 CLINICAL DATA:  Nausea, vomiting. EXAM: ABDOMEN - 2 VIEW COMPARISON:  February 10, 2021. FINDINGS: The bowel gas pattern is normal. There is no evidence of free air. Small nonobstructive right renal calculus is noted. IMPRESSION: Small nonobstructive right renal calculus. No abnormal bowel dilatation is noted. Electronically Signed   By: Marijo Conception M.D.   On: 05/10/2022 09:27   MR THORACIC SPINE W WO CONTRAST  Result Date: 05/07/2022 CLINICAL DATA:  Ataxia.  Nontraumatic.  Non-small cell lung cancer. EXAM: MRI THORACIC WITHOUT AND WITH CONTRAST TECHNIQUE: Multiplanar and multiecho pulse sequences of the thoracic spine were obtained without and with intravenous contrast. CONTRAST:  61m GADAVIST GADOBUTROL 1 MMOL/ML IV SOLN COMPARISON:  Vertebral body heights are maintained. A 12 mm T2 hyperintense enhancing lesion is present in the inferior aspect of the L1 vertebral body. No other discrete osseous lesions present the spine. FINDINGS: Alignment: No significant listhesis is present. Thoracic kyphosis is within normal limits. Mild leftward curvature is centered at T9, stable. Vertebrae: A 12 mm peripherally enhancing lesion is present in the right inferior aspect of L1. Other enhancing lesions are present within the thoracic spine. Vertebral body heights are maintained. Cord:  Normal signal and morphology. Paraspinal and other soft tissues: Enhancing paraspinous soft tissue present right  side of the spine T8-9 T12-L1. T11 posterior rib fracture is better seen on CT. A right pleural effusion enhances. Numerous pulmonary nodules are again noted bilaterally. The largest in the left lung measuring up to 19 mm. Disc levels: Shallow right paramedian disc protrusion is present without significant stenosis or change. Mild right foraminal narrowing is present at the same level. No other significant disc protrusions or central canal stenosis is present. The foramina are patent bilaterally. IMPRESSION: 1. 12 mm peripherally enhancing lesion in the right inferior aspect of L1 is concerning for metastatic disease to the thoracic spine. 2. Enhancing paraspinous soft tissue right side of the spine at T8-9 T12-L1 likely represents metastatic disease to the paraspinous soft tissues. 3. Right T11 posterior rib fracture is better seen on CT. 4. Numerous pulmonary nodules bilaterally compatible with metastatic disease. 5. Shallow right paramedian disc protrusion at T9-10 without significant stenosis or change. Electronically Signed   By: CSan MorelleM.D.   On: 05/07/2022 15:37   MR CERVICAL SPINE W WO CONTRAST  Result Date: 05/07/2022 CLINICAL DATA:  Ataxia, cervical pathology EXAM: MRI CERVICAL SPINE WITHOUT AND WITH CONTRAST TECHNIQUE: Multiplanar and multiecho pulse sequences of the cervical spine, to include the craniocervical junction and cervicothoracic junction, were obtained without and with intravenous contrast. CONTRAST:  646mGADAVIST GADOBUTROL 1 MMOL/ML IV SOLN COMPARISON:  None Available. FINDINGS: Alignment: Minimal anterolisthesis of C3 on C4. Vertebrae: No acute fracture, evidence of discitis, or aggressive bone lesion. Cord: Normal signal and morphology. Posterior Fossa, vertebral arteries, paraspinal tissues: Posterior fossa demonstrates no focal abnormality. Vertebral artery flow voids are maintained. Paraspinal soft tissues are unremarkable. Disc levels: Discs: Anterior cervical fusion  at C7-T1. Degenerative disease with mild disc height loss at C4-5 and C5-6. C2-3: Small central disc protrusion.  Bilateral foraminal stenosis. No central canal stenosis. C3-4: Broad-based disc bulge with a small central disc protrusion. Moderate-severe right foraminal stenosis. Moderate left foraminal stenosis. Mild spinal stenosis. C4-5: Mild broad-based disc bulge. Moderate right facet arthropathy. Moderate-severe left foraminal stenosis. Moderate right foraminal stenosis. No spinal stenosis. C5-6: Small right paracentral disc protrusion. No foraminal or central canal stenosis. C6-7: Mild broad-based disc bulge. No foraminal or central canal stenosis. C7-T1: Interbody fusion. No neural foraminal stenosis. No central canal stenosis. IMPRESSION: 1. At C3-4 there is a broad-based disc bulge with a small central disc protrusion. Moderate-severe right foraminal stenosis. Moderate left foraminal stenosis. Mild spinal stenosis. 2. At C4-5 there is a mild broad-based disc bulge. Moderate right facet arthropathy. Moderate-severe left foraminal stenosis. Moderate right foraminal stenosis. 3. Anterior cervical fusion at C7-T1 without foraminal or central canal stenosis. Electronically Signed   By: Kathreen Devoid M.D.   On: 05/07/2022 15:23    Labs:  Basic Metabolic Panel: Recent Labs  Lab 05/24/22 0340  NA 139  K 4.0  CL 105  CO2 28  GLUCOSE 105*  BUN 21  CREATININE 1.13  CALCIUM 9.5    CBC: Recent Labs  Lab 05/24/22 0340  WBC 4.9  HGB 9.1*  HCT 29.3*  MCV 94.5  PLT 199     Brief HPI:   [name] is a 66 y.o. male with a history of large cell neuroendocrine carcinoma who presented to the emergency department on 05/03/2022 complaining of generalized weakness and multiple falls.  Neurology was consulted and he underwent MRI of the cervical and thoracic spine.  He was experiencing significant orthostatic hypotension and cardiology was consulted.  Ultiva medications have been held and he was  placed on midodrine 3 times daily.  His past medical history significant for paroxysmal atrial fibrillation maintained on Xarelto.   Hospital Course: [facility] VANDENBERGHE was admitted to rehab 05/13/2022 for inpatient therapies to consist of PT, ST and OT at least three hours five days a week. Past admission physiatrist, therapy team and rehab RN have worked together to provide customized collaborative inpatient rehab.  Maintained MS Contin 60 mg every 12 hours.  Patient required increasing dose of oral Dilaudid from 4 mg to 8 mg for metastatic bone pain.  Primary pain source metastases to the right ribs.  Palliative care continue to follow the patient while in inpatient rehab.  Initially patient Cymbalta was held due to hypotension.  Anxiety improved with hydroxyzine 10 mg as needed.  Trazodone 50 mg at nighttime added for insomnia.  He underwent neuropsychiatric consultation on 9/18.  There was some question of onset of confusion and trazodone and Atarax were discontinued and urinalysis and urine culture were obtained.  Urine tests negative.  [facility] was contacted on 9/20 to make arrangements for rescheduling his appointment originally scheduled for 9/21. Persistent nausea treated with Compazine and scopolamine patches. EKG within normal limits on 9/25.    Blood pressures were monitored on TID basis and remained stable on midodrine 10 mg 3 times daily as well as wearing abdominal binder and TED hose. Patient cautioned on use of narcotics and methocarbamol regarding lowering BP.  Rehab course: During patient's stay in rehab weekly team conferences were held to monitor patient's progress, set goals and discuss barriers to discharge. At admission, patient required min assist with ADLs and mobility.  He has had improvement in activity tolerance, balance, postural control as well as ability to compensate for deficits. He has had improvement in  functional use RUE/LUE  and RLE/LLE as well as  improvement in awareness. Patient has met 8 of 8 long term goals due to improved activity tolerance, improved balance, ability to compensate for deficits, and improved coordination.  Patient to discharge at overall Modified Independent level.    Disposition: Home Discharge disposition: 01-Home or Self Care       Diet: Heart healthy  Special Instructions: No driving, alcohol consumption or tobacco use.   Advised to follow-up with PCP in one week to check hemoglobin.  Discharge Instructions     Ambulatory referral to Physical Medicine Rehab   Complete by: As directed    Hospital follow-up   Discharge patient   Complete by: As directed    Discharge disposition: 01-Home or Self Care   Discharge patient date: 05/24/2022      Allergies as of 05/24/2022       Reactions   Prednisone Other (See Comments)   States that this med makes him "crazy"   Tetanus Toxoids Swelling, Other (See Comments)   Fever, Swelling of the arm    Wellbutrin [bupropion] Other (See Comments)   Crazy thoughts, nightmares   Lorazepam Anxiety, Other (See Comments)   Makes him confused   Varenicline Other (See Comments)   Unpleasant dreams        Medication List     STOP taking these medications    hydrOXYzine 10 MG tablet Commonly known as: ATARAX   tiZANidine 2 MG tablet Commonly known as: ZANAFLEX       TAKE these medications    acetaminophen 325 MG tablet Commonly known as: TYLENOL Take 1-2 tablets (325-650 mg total) by mouth every 4 (four) hours as needed for mild pain. What changed:  medication strength how much to take when to take this reasons to take this   benzonatate 100 MG capsule Commonly known as: TESSALON Take 1 capsule (100 mg total) by mouth 3 (three) times daily as needed for cough. TAKE 1 CAPSULE(100 MG) BY MOUTH THREE TIMES DAILY AS NEEDED FOR COUGH Strength: 100 mg   bisacodyl 5 MG EC tablet Commonly known as: DULCOLAX Take 1 tablet (5 mg total) by mouth  daily as needed for moderate constipation. What changed: reasons to take this   diclofenac Sodium 1 % Gel Commonly known as: VOLTAREN Apply 2 g topically 4 (four) times daily.   docusate sodium 100 MG capsule Commonly known as: COLACE Take 1 capsule (100 mg total) by mouth 3 (three) times daily. What changed:  when to take this reasons to take this   escitalopram 5 MG tablet Commonly known as: LEXAPRO Take 1 tablet (5 mg total) by mouth at bedtime.   gabapentin 100 MG capsule Commonly known as: NEURONTIN Take 1 capsule (100 mg total) by mouth 3 (three) times daily.   HYDROmorphone 8 MG tablet Commonly known as: DILAUDID Take 1 tablet by mouth every 6 hours as needed for severe pain.   lidocaine-prilocaine cream Commonly known as: EMLA Apply to the Port-A-Cath site 30-60-minute before treatment What changed:  how much to take how to take this when to take this reasons to take this additional instructions   magnesium gluconate 500 MG tablet Commonly known as: MAGONATE Take 0.5 tablets (250 mg total) by mouth at bedtime.   meloxicam 15 MG tablet Commonly known as: MOBIC Take 1 tablet (15 mg total) by mouth daily. Start taking on: May 25, 2022   methocarbamol 500 MG tablet Commonly known as: ROBAXIN Take 1 tablet (500  mg total) by mouth every 6 (six) hours as needed for muscle spasms.   midodrine 10 MG tablet Commonly known as: PROAMATINE Take 1 tablet (10 mg total) by mouth 3 (three) times daily with meals.   morphine 60 MG 12 hr tablet Commonly known as: MS CONTIN Take 1 tablet by mouth every 12  hours.   naloxone 4 MG/0.1ML Liqd nasal spray kit Commonly known as: NARCAN 1 spray intranasally into 1 nostril. If desired response not obtained after 2 or 3 minutes, may repeat 1 spray into other nostril   ondansetron 8 MG tablet Commonly known as: ZOFRAN Take 1 tablet by mouth every 8 hours as needed for nausea or vomiting. Starting 3 days after  chemotherapy   polyethylene glycol 17 g packet Commonly known as: MIRALAX / GLYCOLAX Take 17 g by mouth daily. Start taking on: May 25, 2022   prochlorperazine 10 MG tablet Commonly known as: COMPAZINE Take 10 mg by mouth every 6 (six) hours as needed for nausea or vomiting.   scopolamine 1 MG/3DAYS Commonly known as: TRANSDERM-SCOP Place 1 patch (1.5 mg total) onto the skin every 3 (three) days.   Vitamin D (Ergocalciferol) 1.25 MG (50000 UNIT) Caps capsule Commonly known as: DRISDOL Take 1 capsule (50,000 Units total) by mouth every 7 (seven) days. Start taking on: May 28, 2022   Xarelto 20 MG Tabs tablet Generic drug: rivaroxaban Take 1 tablet (20 mg total) by mouth every evening.         Follow-up Information     Raulkar, Clide Deutscher, MD Follow up.   Specialty: Physical Medicine and Rehabilitation Why: office will call you to arrange your appt (sent) Contact information: [address] [place] [address] [phone]         [name], MD Follow up.   Specialty: Family Medicine Why: Call in 1-2 days for hospital follow-up appointment Contact information: [address] [place] [address] [phone]         [name], MD .   Specialties: Cardiology, Radiology, Interventional Cardiology Why: See appointment in calendar below. Contact information: [address] [place] [address] [phone]         Lissa Morales, MD. Go to.   Specialty: Internal Medicine Why: October 12th, 2023 at 7:30 for lab, 8:30 for doctor and 9:30 for treatment Contact information: [place] [address] [phone]                 Signed: Barbie Banner 05/24/2022, 11:53 AM

## 2022-05-18 NOTE — Progress Notes (Signed)
Occupational Therapy Session Note  Patient Details  Name: [name] MRN: [id number] Date of Birth: 1956/04/25  Today's Date: 05/18/2022 OT Individual Time: 1191-4782 & 1405-1530 OT Individual Time Calculation (min): 45 min & 85 min   Short Term Goals: Week 1:  OT Short Term Goal 1 (Week 1): STG = LTG 2/2 ELOS  Skilled Therapeutic Interventions/Progress Updates:  Session 1 Skilled OT intervention completed with focus on modification education for donning TEDS, emotional support, functional transfers. Pt received upright in bed, no c/o pain at start of session. Pt expressed his emotions regarding a family member bringing a shirt in for him with all his family/friends names on it to encourage him on his journey. Therapist supported pt emotionally with how he is feeling regarding his status etc.  Pt declined self-care needs at this time. Attempted to have pt donn his own TEDs, however with increased tension reported on low back. Therapist offered education on donning in long sitting and with bag method to increase efficiency and minimize pain with the task as pt will likely need compression of some grade on BLE upon returning home for BP control. With demonstration provided, pt was able to return demo with min A, with difficulty still noted around the heel. Discussed how he could use non-medical grade TEDS and use additional ace wrap if needed however BP has increased since admission.  Transitioned to EOB with mod I. Donned shoes with supervision, with education provided on use of elastic shoe laces for minimizing figure 4 position demand vs slipping shoes on however pt was not interested at the time and was able to lace on his own.  Completed sit > stand with CGA and stand pivot to recliner at same assist. Elevated BLE. Pt remained upright in recliner, with chair pad alarm on and all needs in reach at end of session.  BP 123/80 sitting EOB 141/94 in stance; did not donn abdominal binder  due to stable BP  Session 2 Skilled OT intervention completed with focus on family education with wife present regarding tub transfers, dynamic balance with multi-tasking cognitive component, BUE exercises. Pt received seated in recliner, no c/o pain at start of session.   Therapist reviewed with family/pt his bathroom set up with pt reporting that he prefers to bathe in a tub vs shower due to feeling cold with his treatments and malnutrition/atrophy. Advised that pt trial with therapist vs just trying at home since he's "been doing it for years and never had a problem." Pt agreeable.  Ambulated using RW with CGA, 2 instances of ataxic gait with minor scissoring however no LOB, and noted to be with increased with wife/friend distracting him/talking behind him. Education provided to wife about producing a minimally distractive environment as well as monitoring his use with RW vs walking without to maximize safety.  Using RW, pt ambulated to tub, then utilized grab bar on R hand and L hand on RW to side step over to R side. No difficulty lifting BLE over threshold however did require min A for balance and lowering to bottom of tub floor. Therapist offered safety strategies including installation of grab bar for his shower prior to his use of it as he heavily relied on it for the transfer. With CGA was able to complete bottom of tub floor transfer into standing, then CGA side step > L to exit tub. Cues needed to use RW to ambulate once out of tub as pt with attempt to walk off without it.  Standing at [employer], pt participated in the following assessments: -trail making A- no difficulty cognitively, some difficulty with use of L hand accuracy 2/2 tremor -trail making B- max difficulty cognitively until closer to end of task with fading min cues however required >10 mins to complete. Most difficulty noted with alternating the sequence and with moderately distracting environment when in stance however improved  with seated position to limit multi-tasking demand -Matching picture sequence- mod difficulty -matching letter sequence- no difficulty  Pt indicated pain in R flank increased to an 8/10. Transported back to room with nurse aware of pt's due time for meds, and in room to provide.   Pt agreeable to seated exercises: (With yellow theraband) 10 reps Horizontal abduction Self-anchored shoulder flexion each arm Self-anchored bicep flexion each arm Alternating chest presses Shoulder external rotation  Pt remained upright in bed with bed alarm on, and wife present with all needs met at end of session.   Therapy Documentation Precautions:  Precautions Precautions: Fall Precaution Comments: orthostatic, monitor HR Other Brace: Has abdominal binder and TEDs Restrictions Weight Bearing Restrictions: No    Therapy/Group: Individual Therapy  Blase Mess, MS, OTR/L  05/18/2022, 3:51 PM

## 2022-05-19 DIAGNOSIS — I951 Orthostatic hypotension: Secondary | ICD-10-CM | POA: Diagnosis not present

## 2022-05-19 MED ORDER — ESCITALOPRAM OXALATE 10 MG PO TABS
5.0000 mg | ORAL_TABLET | Freq: Every day | ORAL | Status: DC
  Administered 2022-05-19 – 2022-05-23 (×5): 5 mg via ORAL
  Filled 2022-05-19 (×5): qty 1

## 2022-05-19 MED ORDER — HYDROMORPHONE HCL 1 MG/ML IJ SOLN
1.0000 mg | Freq: Four times a day (QID) | INTRAMUSCULAR | Status: DC | PRN
  Administered 2022-05-19 – 2022-05-20 (×5): 1 mg via INTRAVENOUS
  Filled 2022-05-19 (×5): qty 1

## 2022-05-19 NOTE — Progress Notes (Signed)
Called placed to [facility] regarding follow-up appointment. Left my number and they will call back with date and time after speaking to provider. Patient aware.

## 2022-05-19 NOTE — Plan of Care (Signed)
  Problem: RH PAIN MANAGEMENT Goal: RH STG PAIN MANAGED AT OR BELOW PT'S PAIN GOAL Description: < 4 with prns Outcome: Not Progressing; pain medication adjusted by MD; prefers IV form at this time

## 2022-05-19 NOTE — Progress Notes (Signed)
Patient ID: [name], male   DOB: 12-23-1955, 66 y.o.   MRN: [id number]  Team Conference Report to Patient/Family  Team Conference discussion was reviewed with the patient and caregiver, including goals, any changes in plan of care and target discharge date.  Patient and caregiver express understanding and are in agreement.  The patient has a target discharge date of 05/24/22.  Sw spoke with patient ans provided team conference updates. Patient making great progress. Patient pleased with the discharge date on Monday. Patient has been informed that his appointment with [facility] will be rescheduled after stay on CIR, no additional questions or concerns.   [name] 05/19/2022, 1:36 PM

## 2022-05-19 NOTE — Progress Notes (Signed)
PROGRESS NOTE   Subjective/Complaints: Doing well with therapy Orthostatic when standing, but with much less lightheadedness than before Pain better controlled but still severe at times     05/19/2022    2:54 AM 05/18/2022    8:01 PM 05/18/2022    3:14 PM  Vitals with BMI  Systolic 341 962 229  Diastolic 73 80 72  Pulse 91 88 109     ROS: Denies fevers, chills, abdominal pain, diarrhea, SOB, cough, chest pain, new weakness or paraesthesias.  +pain from metastases, +lightheadedness with standing  Objective:   No results found. Recent Labs    05/17/22 0406  WBC 4.3  HGB 10.3*  HCT 32.4*  PLT 208   Recent Labs    05/17/22 0406  NA 138  K 3.3*  CL 104  CO2 24  GLUCOSE 110*  BUN 9  CREATININE 0.76  CALCIUM 9.4    Intake/Output Summary (Last 24 hours) at 05/19/2022 0926 Last data filed at 05/19/2022 7989 Gross per 24 hour  Intake 354 ml  Output --  Net 354 ml        Physical Exam:  Gen: no distress, normal appearing HENT: No JVD. Neck Supple. Trachea midline. Atraumatic. +temporal wasting Eyes: PERRLA. EOMI. Visual fields grossly intact.  Cardiovascular: RRR, no murmurs/rub/gallops. No Edema. Peripheral pulses 2+  Respiratory: CTAB. No rales, rhonchi, or wheezing. On RA.  Abdomen: + bowel sounds, normoactive. No distention or tenderness. + abdominal binder GU: Not examined.  Skin: C/D/I. No apparent lesions. MSK:      No apparent deformity.      Strength:                RUE: 4/5 SA, 4/5 EF, 4/5 EE, 5/5 WE, 5/5 FF, 5/5 FA                 LUE: 5/5 SA, 5/5 EF, 5/5 EE, 5/5 WE, 5/5 FF, 5/5 FA                 RLE: 4/5 HF, 4/5 KE, 5/5 DF, 5/5 EHL, 5/5 PF                 LLE:  5/5 HF, 5/5 KE, 5/5 DF, 5/5 EHL, 5/5 PF    Neurologic exam:  Cognition: AAO to person, place, time and event. Cannot ad change, can perform serial 7s. Cannot spell world backwards. Language: Fluent, No substitutions or neoglisms. No  dysarthria. Names 3/3 objects correctly.  Memory: Recalls 1/3 objects at 5 minutes.   Insight: Good insight into current condition.  Mood: Appropriate affect, positive mood Sensation: To light touch intact in BL UEs and LEs with exception of right foot plantar, left 5th digit Reflexes: 2+ in BL UE and LEs. Negative Hoffman's and babinski signs bilaterally.  CN: 2-12 grossly intact.  Coordination: + BL UE intention tremors. No ataxia on FTN, HTS bilaterally.  Spasticity: MAS 0 in all extremities  Standing with minimal lightheadedness   Assessment/Plan: 1. Functional deficits which require 3+ hours per day of interdisciplinary therapy in a comprehensive inpatient rehab setting. Physiatrist is providing close team supervision and 24 hour management of active medical problems listed below.  Physiatrist and rehab team continue to assess barriers to discharge/monitor patient progress toward functional and medical goals  Care Tool:  Bathing    Body parts bathed by patient: Right arm, Left arm, Chest, Abdomen, Front perineal area, Buttocks, Right upper leg, Left upper leg, Face   Body parts bathed by helper: Right lower leg, Left lower leg     Bathing assist Assist Level: Minimal Assistance - Patient > 75%     Upper Body Dressing/Undressing Upper body dressing   What is the patient wearing?: Pull over shirt    Upper body assist Assist Level: Set up assist    Lower Body Dressing/Undressing Lower body dressing      What is the patient wearing?: Pants     Lower body assist Assist for lower body dressing: Contact Guard/Touching assist     Toileting Toileting    Toileting assist Assist for toileting: Contact Guard/Touching assist     Transfers Chair/bed transfer  Transfers assist     Chair/bed transfer assist level: Contact Guard/Touching assist     Locomotion Ambulation   Ambulation assist      Assist level: Minimal Assistance - Patient > 75% Assistive device:  Walker-rolling Max distance: 150   Walk 10 feet activity   Assist     Assist level: Minimal Assistance - Patient > 75% Assistive device: Walker-rolling   Walk 50 feet activity   Assist    Assist level: Minimal Assistance - Patient > 75% Assistive device: Walker-rolling    Walk 150 feet activity   Assist    Assist level: Minimal Assistance - Patient > 75% Assistive device: Walker-rolling    Walk 10 feet on uneven surface  activity   Assist     Assist level: Minimal Assistance - Patient > 75% Assistive device: Walker-rolling   Wheelchair     Assist Is the patient using a wheelchair?: No             Wheelchair 50 feet with 2 turns activity    Assist            Wheelchair 150 feet activity     Assist          Blood pressure 122/73, pulse 91, temperature 98.2 F (36.8 C), resp. rate 18, height 5' 9.02" (1.753 m), weight 59.5 kg, SpO2 99 %.  Medical Problem List and Plan: 1. 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  Antithrombotics: -DVT/anticoagulation:  Pharmaceutical: Xarelto             -antiplatelet therapy: none   3. 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 Tylenol, Robaxin             - MS Contin 60 mg BID - home dose             - Dilaudid 4 mg q 4 hours as needed -> increased to home dose 4/8 mg Q4H PRN  Added back IV Dilaudid as per  patient preference and discussed risks and benefits, will d/c prior to discharge and patient and daughter are aware.              - Pain meds managed by cancer center pain management; will need to inform them of  any changes at discharge   4. Anxiety: d/c hydroxyzine due to confusion             -antipsychotic agents: n/a  -discussed starting SSRI and he is agreeable             -depression: Cymbalta held due to hypotension; once improved, patient may benefit from change to Mirtazepine for depression, sleep, and appetite. Do appreciate symptoms of anxiety may be more impactful. Will monitor for now.              - anxiety: hydroxyzine 10 mg PRN; Ativan held due to hypotension             - Added Trazadone 50 mg QHS PRN for insomnia             - Neuropsych consult placed given cognitive deficits, anxiety, and depression s/p rad/chemotherapy for brain mets - on schedule for Monday   5. Neuropsych/cognition: This patient is capable of making decisions on his own behalf.           - SLP consult placed    6. Skin/Wound Care: routine skin care checks   7. Malnutrition (endorses 70+ lb unintentional weight loss in <1 year): routine Is and Os and follow-up chemistries         - Prealbumin low, discussed with patient, encouraged choosing high protein foods.             - Difficulty with POs due to nausea; PRN compazine usually helpful, less so today  8: Large cell neuroendocrine carcinoma with brain, bone metastasis:             -clinical trial med held during admission   9: Orthostatic hypotension: continue midodrine 10 mg TID, advised drinking water in the morning before getting up for therapies             -placed order for smaller abdominal binder.              - abdominal binder and TED only during the daytime - changed to when up to bedside or OOB due to discomfort. Reinforced not needed in bed 9/17.              - +/- Mestinon per cardiology; orthostatics improving, hold off for now             - no beta blockers 10: CAD s/p CABG 2017: Stable.  Currently off beta-blocker   11: Paroxysmal atrial fibrillation: continue Xarelto             -no BB due to significant orthostatic hypotension   12:  Anemia, chronic: follow-up CBC   13. Screening for vitamin D deficiency: add vitamin D level to today's labs   14: Code status: DNR/DNI - following with palliative   15. Confusion: d/c trazodone and atarax, UA and UC ordered and returned negative, discussed with patient. Likely 2/2 to medicines and has resolved with discontinuation    LOS: 6 days A FACE TO FACE EVALUATION WAS PERFORMED  [name] Myrissa Chipley 05/19/2022, 9:26 AM

## 2022-05-19 NOTE — Patient Care Conference (Signed)
Inpatient RehabilitationTeam Conference and Plan of Care Update Date: 05/19/2022   Time: 11:45 AM    Patient Name: [name]      Medical Record Number: [id number]  Date of Birth: [birth year] Sex: Male         Room/Bed: 4M11C/4M11C-01 Payor Info: Payor: MEDICARE / Plan: MEDICARE PART A AND B / Product Type: *No Product type* /    Admit Date/Time:  05/13/2022 12:41 PM  Primary Diagnosis:  Hypotension, postural  Hospital Problems: Principal Problem:   Hypotension, postural Active Problems:   Large cell neuroendocrine carcinoma [facility])    Expected Discharge Date: Expected Discharge Date: 05/24/22  Team Members Present: Physician leading conference: Dr. Leeroy Cha Social Worker Present: Erlene Quan, BSW Nurse Present: Dorien Chihuahua, RN PT Present: Ginnie Smart, PT OT Present: Jennefer Bravo, OT SLP Present: Charolett Bumpers, SLP PPS Coordinator present : Gunnar Fusi, SLP     Current Status/Progress Goal Weekly Team Focus  Bowel/Bladder   Continent of b/b. LBM: 9/18  Offer toileting q 2-4 hrs and as needed  Assess toileting needs q shift & PRN   Swallow/Nutrition/ Hydration             ADL's   Set up A UB bathing/dressing, CGA LB bathing/dressing, CGA toileting  Mod I overall  d/c planning, cardio endurance, general strengthening, balance, AE education, ADL retraining, BP management   Mobility   Independent with bed mobility; SBA/supv for transfers and gait with RW; CGA for stairs  ModI  Endurance/activity tolerance, dynamic stability, BP management, global strengthening   Communication             Safety/Cognition/ Behavioral Observations  min A  sup A  focusing treatments on education regarding memory strategies and use of external memory compensations   Pain   Pt is constantly in pain and asks for pain medication around the clock, even before it is due. Pain level ranges from 5-8/10 and is located on the right side of rib cage.  Keep pain <4/10 by  PRN pain medication  Assess pain q shift & PRN   Skin   Bruising to L knee.  Maintain skin integrity.  Assess skin q shift & PRN     Discharge Planning:  Discharging home with spoue, daughter (ST) , family and friends to assist/supervise.   Team Discussion: Patient experiencing ortho statis but is asymptomatic. Pain med adjusted and UA negative. Note increase in tremors and intermittent confusion; distracted and has to refocus.  Patient on target to meet rehab goals: yes, currently needs set up for ADLs and SBA - supervision for stand pivot transfers. Able to mabulate up to 150' using a RWw with minor LOB but is able to self correct. Working on [employer], word finding deficits with supervision goals for SLP. Goals for discharge set for Mod I overall.  *See Care Plan and progress notes for long and short-term goals.   Revisions to Treatment Plan:  Recommend rescheduling of chemo appointment at [facility] MD ? MRI to follow up on confusion, memory issues Tub transfer practice   Teaching Needs: Safety, transfers, toileting, medications, etc.  Current Barriers to Discharge: Decreased caregiver support  Possible Resolutions to Barriers: Family education Already has DME needed     Medical Summary Current Status: pain from rib metastases, stage 4 cancer, lightheadedness with orthostasis, confusion secondary to medication, anxiety  Barriers to Discharge: Medical stability  Barriers to Discharge Comments: pain from rib metastases, stage 4 cancer, lightheadedness with orthostasis, confusion secondary to  medication, anxiety Possible Resolutions to [employer]: started meloxicam and voltaren gel, added IV dilaudid for severe pain refractory to oral medication, discontinued medications causing confusion   Continued Need for Acute Rehabilitation Level of Care: The patient requires daily medical management by a physician with specialized training in physical medicine and rehabilitation for the  following reasons: Direction of a multidisciplinary physical rehabilitation program to maximize functional independence : Yes Medical management of patient stability for increased activity during participation in an intensive rehabilitation regime.: Yes Analysis of laboratory values and/or radiology reports with any subsequent need for medication adjustment and/or medical intervention. : Yes   I attest that I was present, lead the team conference, and concur with the assessment and plan of the team.   Dorien Chihuahua B 05/19/2022, 1:55 PM

## 2022-05-19 NOTE — Progress Notes (Addendum)
Physical Therapy Session Note  Patient Details  Name: [name] MRN: [id number] Date of Birth: 09/09/1955  Today's Date: 05/19/2022 PT Individual Time: 0808-0905 PT Individual Time Calculation (min): 57 min   Short Term Goals: Week 1:  PT Short Term Goal 1 (Week 1): STG=LTG secondary to ELOS   Skilled Therapeutic Interventions/Progress Updates:   Pt received supine in bed and agreeable to PT. Supine>sit transfer without assist or cues. Sitting balance EOB to take meds from RN.  Mild essential tremor noted inBUE while taking meds.   Stand pivot transfer to [facility] with RW supervision assist.   Orthostatic VS assessment.  Sitting: 113/77, 116  Standnig 88/68 HR 143via Dinamap (assessed manually 120) mild S/s Standing 1 minute 93/70 HR 153via Dinamap  (assessed manually 123) mils s/s  Gait training with RW x 255f with supervision assist cues for posture. Pt reports mild SOB for last 137fof gait training. Prolonged reset break following gait training.   BLE strength training to perform step ups on 6 inch step performed x 5 forward BLE and laterally x 5 BLE with BUE support on rails. .   Reassess orthostatic VS.  Sitting 115/82 HR 121.  Standing 87/71 HR 141 via dinamap mild s/s  Standing 1 min 84/70 HR 159 via dinamap  mils s/s  MD present for assessment. No Abdominal binder throughout session   Patient returned to room and performed stand pivot to recliner with RW and supervision assist for safety. Pt left sitting in recliner with call bell in reach and all needs met.          Therapy Documentation Precautions:  Precautions Precautions: Fall Precaution Comments: orthostatic, monitor HR Other Brace: Has abdominal binder and TEDs Restrictions Weight Bearing Restrictions: No    Pain: denies    Therapy/Group: Individual Therapy  AuLorie Phenix/20/2023, 9:30 AM

## 2022-05-19 NOTE — Progress Notes (Signed)
Occupational Therapy Session Note  Patient Details  Name: [name] MRN: [id number] Date of Birth: 02-Jun-1956  Today's Date: 05/19/2022 OT Individual Time: 1020-1130 & 1405-1500 OT Individual Time Calculation (min): 70 min & 55 min   Short Term Goals: Week 1:  OT Short Term Goal 1 (Week 1): STG = LTG 2/2 ELOS  Skilled Therapeutic Interventions/Progress Updates:  Session 1 Skilled OT intervention completed with focus on ADL retraining, functional transfers, cardiovascular endurance. Pt received seated in recliner, no c/o pain, agreeable to session.   Per nursing yesterday- pt did not void during day. Therapist discussed with pt about fluid intake, typical voiding frequency and encouraged pt to try. Sit > stand with supervision without AD then utilized urinal with supervision however unable to void other than a very minimal amount. Nursing notified of pt voiding status as pt states it's been several hours since he went last.  Completed ambulatory transfer to sink side using RW with CGA. Participated in UB bathing and periareas with supervision. Cues needed at time for sequencing. Pt indicated that he was cold with noteable shivering. Discussed with pt the side effects of chemo, frailty as well as tremors present, as upon dressing/warmth pt continued to have moderate tremors in BUE. MD notified during team meeting of status.   Set up A UB dressing, supervision LB dressing. Supervision donning of shoes. Pt does continued to demonstrate mild attention deficits, with fading off or losing train of thought during conversation however with min guidance is easily redirected.   Transported dependently in w/c for time <> gym. CGA ambulatory transfer to nustep using RW then completed 7 mins on level 5, with no c/o dizziness or fatigue.  Back in room, pt transferred back to bed, with mod I bed mobility, bed alarm on, all needs in reach. Notified nursing of pain med request per pt at  departure.  Session 2 Skilled OT intervention completed with focus on pain management education, ambulatory endurance, and kitchen management. Pt received upright in bed, with wife and business partner present. Pt consistently reports about 8/10 pain in R flank regardless of meds, however per family report was given IV dilaudid today, and has been "spacey and more fatigued" today. Family expressed concerns regarding pt's transition from the IV to oral form of the meds as pt indicates oral is not effective but will be unable to receive IV once home. Wife reports pt has a history of becoming agitated about his medication regimen, trying to take more frequent doses of the oral meds when not due for it and she fears this will happen if not slowly weaned during his stay in the hospital.  Therapist offered clarifications about physical changes that can occur with fluctuations in medications/types, as well as support for both pt and family as they problem solved managing pt's pain levels as well as physical/psychological progression. Education provided about other non-pharm pain interventions pt could try to utilize and did encourage pt to try weaning from IV meds and transitioning to only oral, to see how his physical mobility and participation reacts. Pt did show signs of frustration and misunderstanding, with this therapist suspecting very little receptiveness to the concerns addressed however this therapist did advocate for pt's autonomy in the situation, with his diagnosis/prognosis. Did notify nurse at end of session of family's concerns and requested nurse to educate pt further on why IV meds vs orals can appear more effective despite dosing.  Transitioned to EOB with supervision and donned shoes with set  up A. Completed all sit > stands and ambulatory transfers with the RW from room > ADL kitchen > back to room with supervision/intermittent CGA. No LOB noted, however did "stub the toe" on RLE.   In  kitchen, pt demonstrated ability to reach into various cabinet heights, gather items, open/close fridge door, all at the supervision level with use of RW. Cues were needed for using RW, not pushing it to the side with ambulation, as well as using BUE on RW vs one hand when attempting to carry or transfer food from surfaces. Therapist retrieved walker tray for use with gathering items, with education provided about benefits, purpose and use of the device for increasing independence with IADLs at home. Picture shown to pt for option to purchase.  Pt returned to bed, and remained upright in bed, with bed alarm on and all needs in reach at end of session.  Therapy Documentation Precautions:  Precautions Precautions: Fall Precaution Comments: orthostatic, monitor HR Other Brace: Has abdominal binder and TEDs Restrictions Weight Bearing Restrictions: No    Therapy/Group: Individual Therapy  Blase Mess, MS, OTR/L  05/19/2022, 3:49 PM

## 2022-05-20 ENCOUNTER — Encounter (HOSPITAL_COMMUNITY): Payer: Self-pay

## 2022-05-20 ENCOUNTER — Other Ambulatory Visit: Payer: Medicare Other

## 2022-05-20 ENCOUNTER — Ambulatory Visit: Payer: Medicare Other | Admitting: Internal Medicine

## 2022-05-20 ENCOUNTER — Inpatient Hospital Stay (HOSPITAL_COMMUNITY): Payer: Medicare Other

## 2022-05-20 ENCOUNTER — Ambulatory Visit: Payer: Medicare Other

## 2022-05-20 DIAGNOSIS — I951 Orthostatic hypotension: Secondary | ICD-10-CM | POA: Diagnosis not present

## 2022-05-20 MED ORDER — DOCUSATE SODIUM 100 MG PO CAPS
100.0000 mg | ORAL_CAPSULE | Freq: Three times a day (TID) | ORAL | Status: DC
  Administered 2022-05-20 – 2022-05-24 (×13): 100 mg via ORAL
  Filled 2022-05-20 (×13): qty 1

## 2022-05-20 MED ORDER — PROCHLORPERAZINE MALEATE 5 MG PO TABS
5.0000 mg | ORAL_TABLET | Freq: Four times a day (QID) | ORAL | Status: DC
  Administered 2022-05-20 – 2022-05-21 (×2): 10 mg via ORAL
  Filled 2022-05-20 (×3): qty 2

## 2022-05-20 MED ORDER — PROCHLORPERAZINE EDISYLATE 10 MG/2ML IJ SOLN
5.0000 mg | Freq: Four times a day (QID) | INTRAMUSCULAR | Status: DC
  Administered 2022-05-20 (×2): 10 mg via INTRAMUSCULAR
  Filled 2022-05-20 (×2): qty 2

## 2022-05-20 MED ORDER — GADOPICLENOL 0.5 MMOL/ML IV SOLN
6.0000 mL | Freq: Once | INTRAVENOUS | Status: AC | PRN
  Administered 2022-05-20: 6 mL via INTRAVENOUS

## 2022-05-20 MED ORDER — PROCHLORPERAZINE 25 MG RE SUPP
12.5000 mg | Freq: Four times a day (QID) | RECTAL | Status: DC
  Filled 2022-05-20 (×5): qty 1

## 2022-05-20 NOTE — Progress Notes (Signed)
Occupational Therapy Session Note  Patient Details  Name: [name] MRN: [id number] Date of Birth: Jan 18, 1956  Today's Date: 05/20/2022 OT Individual Time: 7564-3329 OT Individual Time Calculation (min): 36 min    Short Term Goals: Week 1:  OT Short Term Goal 1 (Week 1): STG = LTG 2/2 ELOS  Skilled Therapeutic Interventions/Progress Updates:  Pt seen for missed minutes. Pt agreeable to OT session. Pt able to complete ambulatory toilet transfer with Rw and supervision, S for 3/3 toileting tasks. Pt completed functional ambulation down to gym with Rw and CGA. Pt completed below therapeutic activities to facilitate improved strength/endurance for higher level ADLS: J18 sit>stand from EOM holding 2 lb weighted ball and pressing ball OH with supervision  Standing chest presses with 2 lb weighted ball x10 reps close supervision  1 min of reciprocal stepping onto 2 inch step with BUE support and close supervision X10 standing squats with BUE support, pt then reprots fatigue needign rest break, HR 129 bpm,  Deferred walking back to room and rolled pt in w/c for energy conservation, HR back to 95 in room where pt was left in bed with all needs within reach.   Pt with TEDS and binder donned no s/s of OH  Therapy Documentation Precautions:  Precautions Precautions: Fall Precaution Comments: orthostatic, monitor HR Other Brace: Has abdominal binder and TEDs Restrictions Weight Bearing Restrictions: No  Pain: No pain reported    Therapy/Group: Individual Therapy  Precious Haws 05/20/2022, 3:57 PM

## 2022-05-20 NOTE — Evaluation (Signed)
Recreational Therapy Assessment and Plan  Patient Details  Name: [name] MRN: [id number] Date of Birth: 10-Nov-1955 Today's Date: 05/20/2022  Rehab Potential:  Good ELOS:   d/c 9/25  Assessment  Hospital Problem: Principal Problem:   Hypotension, postural     Past Medical History:      Past Medical History:  Diagnosis Date   Anemia     Anxiety     Arthritis     Basal cell carcinoma (BCC) of forehead     CAD (coronary artery disease)      a. 10/2015 ant STEMI >> LHC with 3 v CAD; oLAD tx with POBA >> emergent CABG. b. Multiple evals since that time, early graft failure of SVG-RCA by cath 03/2016. c. 2/19 PCI/DES x1 to pRCA, normal EF.   Carotid artery disease ([place])      a. 40-59% BICA 02/2018.   Depression     Dyspnea     Ectopic atrial tachycardia (HCC)     Esophageal reflux      eosinophil esophagitis   Family history of adverse reaction to anesthesia      "sister has PONV" (06/21/2017)   Former tobacco use     Gout     Hepatitis C      "treated and cured" (06/21/2017)   High cholesterol     History of blood transfusion     History of kidney stones     Hypertension     Ischemic cardiomyopathy      a. EF 25-30% at intraop TEE 4/17  //  b. Limited Echo 5/17 - EF 45-50%, mild ant HK. c. EF 55-65% by cath 09/2017.   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 HR dropping into 40s in 02/2016 -> BB reduced.   Stroke [facility]) 10/2016    "small one; sometimes my memory/cognitive issues" (06/21/2017)   Symptomatic hypotension      a. 02/2016 ER visit -> meds reduced.   Syncope     Wears dentures     Wears glasses      Past Surgical History:       Past Surgical History:  Procedure Laterality Date   25 GAUGE PARS PLANA VITRECTOMY WITH 20 GAUGE MVR PORT   12/31/2020    Procedure: 25 GAUGE PARS PLANA VITRECTOMY WITH 20 GAUGE MVR PORT;   Surgeon: [name], MD;  Location: [facility];  Service: Thoracic;;   ANTERIOR CERVICAL DECOMP/DISCECTOMY FUSION N/A 10/17/2018    Procedure: Anterior Cervical Decompression Fusion - Cervical seven -Thoracic one;  Surgeon: Consuella Lose, MD;  Location: [place];  Service: Neurosurgery;  Laterality: N/A;   BASAL CELL CARCINOMA EXCISION        "forehead   BIOPSY   07/20/2019    Procedure: BIOPSY;  Surgeon: Carol Ada, MD;  Location: [facility];  Service: Endoscopy;;   BIOPSY OF MEDIASTINAL MASS Right 07/29/2021    Procedure: RIGHT CHEST WALL MASS BIOPSY;  Surgeon: [name], MD;  Location: [place];  Service: Thoracic;  Laterality: Right;   BRONCHIAL BIOPSY   12/24/2020    Procedure: BRONCHIAL BIOPSIES;  Surgeon: Garner Nash, DO;  Location: [place] [facility];  Service: Pulmonary;;   BRONCHIAL BRUSHINGS   12/24/2020    Procedure: BRONCHIAL BRUSHINGS;  Surgeon: Garner Nash, DO;  Location: [place] [facility];  Service: Pulmonary;;   BRONCHIAL NEEDLE ASPIRATION BIOPSY   12/24/2020    Procedure: BRONCHIAL NEEDLE ASPIRATION BIOPSIES;  Surgeon: Garner Nash, DO;  Location: [place];  Service: Pulmonary;;   CARDIAC CATHETERIZATION N/A 11/28/2015    Procedure: Left Heart Cath and Coronary Angiography;  Surgeon: [name], MD;  Location: [place] [facility];  Service: Cardiovascular;  Laterality: N/A;   CARDIAC CATHETERIZATION N/A 11/28/2015    Procedure: Coronary Balloon Angioplasty;  Surgeon: [name], MD;  Location: [place] [facility];  Service: Cardiovascular;  Laterality: N/A;  ostial LAD   CARDIAC CATHETERIZATION N/A 11/28/2015    Procedure: Coronary/Graft Angiography;  Surgeon: [name], MD;  Location: [place] [facility];  Service: Cardiovascular;  Laterality: N/A;  coronaries only     CARDIAC CATHETERIZATION N/A 04/21/2016    Procedure: Left Heart Cath and Coronary Angiography;  Surgeon: Wellington Hampshire, MD;  Location: [place] [facility];   Service: Cardiovascular;  Laterality: N/A;   CARDIAC CATHETERIZATION N/A 06/14/2016    Procedure: Left Heart Cath and Cors/Grafts Angiography;  Surgeon: Lorretta Harp, MD;  Location: [place] [facility];  Service: Cardiovascular;  Laterality: N/A;   CARDIAC CATHETERIZATION N/A 09/08/2016    Procedure: Left Heart Cath and Cors/Grafts Angiography;  Surgeon: Wellington Hampshire, MD;  Location: [place] [facility];  Service: Cardiovascular;  Laterality: N/A;   CARDIAC CATHETERIZATION       CORONARY ARTERY BYPASS GRAFT N/A 11/28/2015    Procedure: CORONARY ARTERY BYPASS GRAFTING (CABG) TIMES FIVE USING LEFT INTERNAL MAMMARY ARTERY AND RIGHT GREATER SAPHENOUS,VIEN HARVEATED BY ENDOVIEN, INTRAOPPRATIVE TEE;  Surgeon: Gaye Pollack, MD;  Location: [place];  Service: Open Heart Surgery;  Laterality: N/A;   CORONARY STENT INTERVENTION N/A 10/05/2017    Procedure: CORONARY STENT INTERVENTION;  Surgeon: [name], MD;  Location: [place] [facility];  Service: Cardiovascular;  Laterality: N/A;   ESOPHAGOGASTRODUODENOSCOPY (EGD) WITH PROPOFOL N/A 07/20/2019    Procedure: ESOPHAGOGASTRODUODENOSCOPY (EGD) WITH PROPOFOL;  Surgeon: Carol Ada, MD;  [facility];  Service: Endoscopy;  Laterality: N/A;   ESOPHAGOGASTRODUODENOSCOPY (EGD) WITH PROPOFOL N/A 01/30/2021    Procedure: ESOPHAGOGASTRODUODENOSCOPY (EGD) WITH PROPOFOL;  Surgeon: Carol Ada, MD;  [facility];  Service: Endoscopy;  Laterality: N/A;   FIDUCIAL MARKER PLACEMENT   12/24/2020    Procedure: FIDUCIAL DYE MARKER PLACEMENT;  Surgeon: Garner Nash, DO;  Location: [place] [facility];  Service: Pulmonary;;   HUMERUS SURGERY Right 1969    "tumor inside bone; filled it w/bone chips"   INTERCOSTAL NERVE BLOCK Right 12/24/2020    Procedure: INTERCOSTAL NERVE BLOCK;  Surgeon: [name], MD;  Location: [place];  Service: Thoracic;  Laterality: Right;   IR IMAGING GUIDED PORT INSERTION   02/06/2021   LEFT HEART CATH AND  CORS/GRAFTS ANGIOGRAPHY N/A 03/11/2017    Procedure: Left Heart Cath and Cors/Grafts Angiography;  Surgeon: Leonie Man, MD;  Location: [place] [facility];  Service: Cardiovascular;  Laterality: N/A;   LEFT HEART CATH AND CORS/GRAFTS ANGIOGRAPHY N/A 10/05/2017    Procedure: LEFT HEART CATH AND CORS/GRAFTS ANGIOGRAPHY;  Surgeon: [name], MD;  Location: [place] [facility];  Service: Cardiovascular;  Laterality: N/A;   LEFT HEART CATH AND CORS/GRAFTS ANGIOGRAPHY N/A 04/11/2019    Procedure: LEFT HEART CATH AND CORS/GRAFTS ANGIOGRAPHY;  Surgeon: [name], MD;  Location: [place] [facility];  Service: Cardiovascular;  Laterality: N/A;   NODE DISSECTION Right  12/24/2020    Procedure: NODE DISSECTION;  Surgeon: [name], MD;  Location: [place];  Service: Thoracic;  Laterality: Right;   PERIPHERAL VASCULAR CATHETERIZATION N/A 06/14/2016    Procedure: Lower Extremity Angiography;  Surgeon: Lorretta Harp, MD;  Location: [place] [facility];  Service: Cardiovascular;  Laterality: N/A;   VIDEO BRONCHOSCOPY WITH ENDOBRONCHIAL NAVIGATION Right 12/24/2020    Procedure: VIDEO BRONCHOSCOPY WITH ENDOBRONCHIAL NAVIGATION;  Surgeon: Garner Nash, DO;  Location: [place];  Service: Pulmonary;  Laterality: Right;   VIDEO BRONCHOSCOPY WITH ENDOBRONCHIAL ULTRASOUND N/A 12/24/2020    Procedure: VIDEO BRONCHOSCOPY WITH ENDOBRONCHIAL ULTRASOUND;  Surgeon: Garner Nash, DO;  Location: [place];  Service: Pulmonary;  Laterality: N/A;   VIDEO BRONCHOSCOPY WITH INSERTION OF INTERBRONCHIAL VALVE (IBV) N/A 12/31/2020    Procedure: VIDEO BRONCHOSCOPY WITH INSERTION OF INTERBRONCHIAL VALVE (IBV).VALVE IN CARTRIDGE 36m,9mm. 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  [facility] complaining of generalized weakness and multiple falls for approximately 3 weeks.  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  He is s/p whole brain radiation currently on experimental chemotherapy. [facility] is typically held during hospital admission according to trial study coordinator.  Neurology was consulted and he underwent MRI of cervical and thoracic spine this admission.  He is currently being aggressively treated for significant orthostatic hypotension and cardiology has been consulted.  Multiple medications have been held and he is on midodrine 3 times daily.  This has improved however he he still tachycardic to 140s when ambulating. His past medical history significant for coronary artery disease status post emergent CABG in 2017.  He also has a history of paroxysmal atrial fibrillation maintained on Xarelto.  Current ejection fraction is estimated at 55 to 60%. The patient requires inpatient physical medicine and rehabilitation evaluations and treatment secondary to dysfunction due to metastatic large cell neuroendocrine carcinoma. Patient transferred to CIR on 05/13/2022.  Met with pt today to discuss TR services including leisure education, activity analysis/modifications and stress management.  Also discussed the importance of social, emotional, spiritual health in addition to physical health and their effects on overall health and wellness.  Pt stated understanding.    Pt presents with decreased activity tolerance, decreased functional mobility, decreased balance, decreased  coordination, decreased memory, feelings of stress Limiting pt's independence with leisure/community pursuits.  Plan  No further TR as pt is expected to discharge Monday.  Recommendations for other services: None   Discharge Criteria: Patient will be discharged from TR if patient refuses treatment 3 consecutive times without medical reason.  If treatment goals not met, if there is a change in medical status, if patient makes no progress towards goals or if patient is discharged from hospital.  The above assessment, treatment plan, treatment alternatives and goals were discussed and mutually agreed upon: by patient  [place]9/21/2023, 12:08 PM

## 2022-05-20 NOTE — Progress Notes (Signed)
PROGRESS NOTE   Subjective/Complaints: Dry heaving this morning. Said he also had nausea at times prior to admission. Discussed scheduling his compazine and he is agreeable     05/20/2022    4:53 AM 05/19/2022    7:33 PM 05/19/2022    4:24 PM  Vitals with BMI  Systolic 048 889 169  Diastolic 83 76 63  Pulse 81 79 77     ROS: Denies fevers, chills, abdominal pain, diarrhea, SOB, cough, chest pain, new weakness or paraesthesias.  +pain from metastases, +lightheadedness with standing- improved  Objective:   No results found. No results for input(s): "WBC", "HGB", "HCT", "PLT" in the last 72 hours.  No results for input(s): "NA", "K", "CL", "CO2", "GLUCOSE", "BUN", "CREATININE", "CALCIUM" in the last 72 hours.   Intake/Output Summary (Last 24 hours) at 05/20/2022 1004 Last data filed at 05/20/2022 0845 Gross per 24 hour  Intake 336 ml  Output --  Net 336 ml        Physical Exam:  Gen: no distress, normal appearing HENT: No JVD. Neck Supple. Trachea midline. Atraumatic. +temporal wasting Eyes: PERRLA. EOMI. Visual fields grossly intact.  Cardiovascular: RRR, no murmurs/rub/gallops. No Edema. Peripheral pulses 2+  Respiratory: CTAB. No rales, rhonchi, or wheezing. On RA.  Abdomen: + bowel sounds, normoactive. No distention or tenderness. + abdominal binder GU: Not examined.  Skin: C/D/I. No apparent lesions. MSK:      No apparent deformity.      Strength:                RUE: 4/5 SA, 4/5 EF, 4/5 EE, 5/5 WE, 5/5 FF, 5/5 FA                 LUE: 5/5 SA, 5/5 EF, 5/5 EE, 5/5 WE, 5/5 FF, 5/5 FA                 RLE: 4/5 HF, 4/5 KE, 5/5 DF, 5/5 EHL, 5/5 PF                 LLE:  5/5 HF, 5/5 KE, 5/5 DF, 5/5 EHL, 5/5 PF    Neurologic exam:  Cognition: AAO to person, place, time and event. Cannot ad change, can perform serial 7s. Cannot spell world backwards. Language: Fluent, No substitutions or neoglisms. No dysarthria. Names  3/3 objects correctly.  Memory: Recalls 1/3 objects at 5 minutes.   Insight: Good insight into current condition.  Mood: Appropriate affect, positive mood Sensation: To light touch intact in BL UEs and LEs with exception of right foot plantar, left 5th digit Reflexes: 2+ in BL UE and LEs. Negative Hoffman's and babinski signs bilaterally.  CN: 2-12 grossly intact.  Coordination: + BL UE intention tremors-more pronounced. No ataxia on FTN, HTS bilaterally.  Spasticity: MAS 0 in all extremities  Standing with minimal lightheadedness   Assessment/Plan: 1. Functional deficits which require 3+ hours per day of interdisciplinary therapy in a comprehensive inpatient rehab setting. Physiatrist is providing close team supervision and 24 hour management of active medical problems listed below. Physiatrist and rehab team continue to assess barriers to discharge/monitor patient progress toward functional and medical goals  Care Tool:  Bathing    Body parts bathed by patient: Right arm, Left arm, Chest, Abdomen, Front perineal area, Buttocks, Right upper leg, Left upper leg, Face   Body parts bathed by helper: Right lower leg, Left lower leg     Bathing assist Assist Level: Minimal Assistance - Patient > 75%     Upper Body Dressing/Undressing Upper body dressing   What is the patient wearing?: Pull over shirt    Upper body assist Assist Level: Set up assist    Lower Body Dressing/Undressing Lower body dressing      What is the patient wearing?: Pants     Lower body assist Assist for lower body dressing: Contact Guard/Touching assist     Toileting Toileting    Toileting assist Assist for toileting: Supervision/Verbal cueing     Transfers Chair/bed transfer  Transfers assist     Chair/bed transfer assist level: Contact Guard/Touching assist     Locomotion Ambulation   Ambulation assist      Assist level: Minimal Assistance - Patient > 75% Assistive device:  Walker-rolling Max distance: 150   Walk 10 feet activity   Assist     Assist level: Minimal Assistance - Patient > 75% Assistive device: Walker-rolling   Walk 50 feet activity   Assist    Assist level: Minimal Assistance - Patient > 75% Assistive device: Walker-rolling    Walk 150 feet activity   Assist    Assist level: Minimal Assistance - Patient > 75% Assistive device: Walker-rolling    Walk 10 feet on uneven surface  activity   Assist     Assist level: Minimal Assistance - Patient > 75% Assistive device: Chemical engineer     Assist Is the patient using a wheelchair?: No             Wheelchair 50 feet with 2 turns activity    Assist            Wheelchair 150 feet activity     Assist          Blood pressure 134/83, pulse 81, temperature 98.3 F (36.8 C), temperature source Oral, resp. rate 18, height 5' 9.02" (1.753 m), weight 59.5 kg, SpO2 99 %.  Medical Problem List and Plan: 1. 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  Antithrombotics: -DVT/anticoagulation:  Pharmaceutical: Xarelto             -antiplatelet therapy: none   3. 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 Tylenol, Robaxin             - MS Contin 60 mg BID - home dose             - Dilaudid 4 mg q 4 hours as needed -> increased to home dose 4/8 mg Q4H PRN  Added back IV Dilaudid as per patient preference and discussed risks and benefits, will d/c prior to discharge and patient and daughter are aware.              -  Pain meds managed by cancer center pain management; will  need to inform them of any changes at discharge   4. Anxiety: d/c hydroxyzine due to confusion             -antipsychotic agents: n/a  -discussed starting SSRI and he is agreeable             -depression: Cymbalta held due to hypotension; once improved, patient may benefit from change to Mirtazepine for depression, sleep, and appetite. Do appreciate symptoms of anxiety may be more impactful. Will monitor for now.              - anxiety: hydroxyzine 10 mg PRN; Ativan held due to hypotension             - Added Trazadone 50 mg QHS PRN for insomnia             - Neuropsych consult placed given cognitive deficits, anxiety, and depression s/p rad/chemotherapy for brain mets - on schedule for Monday   5. Neuropsych/cognition: This patient is capable of making decisions on his own behalf.           - SLP consult placed    6. Skin/Wound Care: routine skin care checks   7. Malnutrition (endorses 70+ lb unintentional weight loss in <1 year): routine Is and Os and follow-up chemistries         - Prealbumin low, discussed with patient, encouraged choosing high protein foods.             - Difficulty with POs due to nausea; PRN compazine usually helpful, less so today  8: Large cell neuroendocrine carcinoma with brain, bone metastasis:             -clinical trial med held during admission   9: Orthostatic hypotension: continue midodrine 10 mg TID, advised drinking water in the morning before getting up for therapies             -placed order for smaller abdominal binder.              - abdominal binder and TED only during the daytime - changed to when up to bedside or OOB due to discomfort. Reinforced not needed in bed 9/17.              - +/- Mestinon per cardiology; orthostatics improving, hold off for now             - no beta blockers 10: CAD s/p CABG 2017: Stable.  Currently off beta-blocker   11: Paroxysmal atrial fibrillation: continue Xarelto             -no BB due to significant orthostatic  hypotension   12: Anemia, chronic: follow-up CBC   13. Vitamin D deficiency: level 17.42: start ergocalciferol 50,000U once per week.    14: Code status: DNR/DNI - following with palliative   15. Confusion: d/c trazodone and atarax, UA and UC ordered and returned negative, discussed with patient. Likely 2/2 to medicines and has imroved with discontinuation  16. Constipation: start colace 100mg  TID  17. Nausea: scheduled compazine.   18. Worsening essential tremors, intermittent confusion: discussed getting brain MRI to assess for metastases and he is agreeable    LOS: 7 days A FACE TO FACE EVALUATION WAS PERFORMED  Munirah Doerner P Jizel Cheeks 05/20/2022, 10:04 AM

## 2022-05-20 NOTE — Progress Notes (Signed)
Occupational Therapy Session Note  Patient Details  Name: [name] MRN: [id number] Date of Birth: [birth year]  Today's Date: 05/20/2022 OT Individual Time: 1884-1660 OT Individual Time Calculation (min): 39 min    Short Term Goals: Week 1:  OT Short Term Goal 1 (Week 1): STG = LTG 2/2 ELOS  Skilled Therapeutic Interventions/Progress Updates:  Pt awake in bed upon OT arrival to the room. Pt reports, "I don't Know what the MRI was about. She told me, but I can't remember." OT provided education while referencing physician note on purpose for MRI. Pt in agreement for OT session.  Therapy Documentation Precautions:  Precautions Precautions: Fall Precaution Comments: orthostatic, monitor HR Other Brace: Has abdominal binder and TEDs Restrictions Weight Bearing Restrictions: No Vital Signs: Please see "Flowsheet" for most recent vitals charted by nursing staff.  Pain: Pain Assessment Pain Scale: 0-10 Pain Score: 8  Pain Type: Acute pain Pain Location: Rib cage Pain Orientation: Right Pain Descriptors / Indicators: Stabbing Pain Onset: On-going Pain Intervention(s): Distraction;Emotional support Multiple Pain Sites: No  ADL: Pt declines need to perform ADLs at this time. Pt's lunch tray arrived at beginning of OT session with pt requesting to eat. Therefore, OT opted to provided home safety, fall prevention, and orthostatic hypotension education with how this impacts safety. Pt able to open containers and feed self without physical assist while supine in bed with HOB elevated to > 50 degrees.  Therapeutic Activity: OT noted that pt has wrist BP machine at besides that this OT provided assistance with ordering. Pt able to don and start taking BP independently, however, pt unsure if BP readings (106/68) were [facility]. Pt reported not knowing functional BP. Pt reports difficulty with recall. OT provides handout on standard for vitals signs and provides education on orthostatic  hypotension. OT recommends pt taking BP first thing in the morning prior to mobility and then taking orthostatic vitals to determine safe mobility levels at this time. OT recommended if pt is experiencing orthostatic hypotension that mobility/activity levels should be reduced to potentially only perform SPT's if BP does not improve in order to decrease risks of falls. However, reinforced the importance of staying in positions for extended time 3-5 minutes can assist with increasing BP after positional changes and to closely monitor using BP machine. Pt verbalizes understanding, however, reports difficulty with recalling education. OT write brief instructions for pt as a decision making guide for when to limit activity and when to take orthostatic BP. Pt reports handout provides assistance and OT reinforced that this can be integrated into therapy treatments to improve pt's understanding and comfort.   Pt requested to remain in bed at end of session. Pt left resting comfortably in bed with personal belongings and call light within reach, bed alarm on and activated, bed in low position, 3 bed rails up, and comfort needs attended to.   Therapy/Group: Individual Therapy  [name] 05/20/2022, 4:41 PM

## 2022-05-20 NOTE — Progress Notes (Signed)
Speech Language Pathology Daily Session Note  Patient Details  Name: [name] MRN: [id number] Date of Birth: October 16, 1955  Today's Date: 05/20/2022 SLP Individual Time: 1455-1545 SLP Individual Time Calculation (min): 50 min  Short Term Goals: Week 1: SLP Short Term Goal 1 (Week 1): STG=LTG due to ELOS  Skilled Therapeutic Interventions: Skilled ST treatment focused on cognitive goals. Pt awake in bed on arrival with memory notebook and blood pressure "cheat sheet" from OT at bedside. Pt stated "I need to check my blood pressure more often but it's been all over the place" thus expressing concern if wrist blood pressure monitor is accurate. Pt independently took BP on left wrist which was 86/58 in lying position. Moments later, SLP took BP on R arm via automatic hospital monitor which was 88/63. Assessed BP again 5 minutes later with pt's wrist monitor which was 93/64. Pt non-symptomatic. Immediately notified nurse of low blood pressure readings.   Pt independently utilized [employer] as external aid to identify when he was able to request pain and nausea medication. Nurse arrived to administer meds during session and wrote down when pt is due for evening medications. Nurse provided sup A verbal repetition cues for understanding. Pt required additional processing time, clarification, and verbal repetition to comprehend and was eventually able to verbalize understanding through teach back. SLP facilitated education on word finding strategies, per pt and PT report of difficulty during a previous session. Pt verbalized understanding through teach back and demonstrated effective use with mod I. Continue efforts with compensatory memory strategies and education. It appears therapy team and nursing staff have been great with offering opportunity for carry over. Patient was left in bed with alarm activated and immediate needs within reach at end of session. Continue per current plan of care.       Pain Pain Assessment Pain Scale: 0-10 Pain Score: 8  Pain Type: Acute pain Pain Location: Rib cage Pain Orientation: Right Pain Descriptors / Indicators: Stabbing Pain Onset: On-going Pain Intervention(s): Distraction;Emotional support Multiple Pain Sites: No  Therapy/Group: Individual Therapy  Patty Sermons 05/20/2022, 3:51 PM

## 2022-05-20 NOTE — Progress Notes (Signed)
Occupational Therapy Session Note  Patient Details  Name: [name] MRN: [id number] Date of Birth: 1956-06-04  Today's Date: 05/20/2022 OT Individual Time: 0800-0900 OT Individual Time Calculation (min): 60 min    Short Term Goals: Week 1:  OT Short Term Goal 1 (Week 1): STG = LTG 2/2 ELOS  Skilled Therapeutic Interventions/Progress Updates:  Skilled OT intervention completed with focus on nausea and BP management, toileting needs. Pt received upright in bed, reporting that he had a rough night with dry heaving from nausea and still having symptoms this morning. Pt agreeable to try ginger ale and saltines to soothe stomach, with report that ginger ale was helping a little. Nursing also notified about pt's status and need of anti-nausea meds when due and in room to administer applicable meds at start of session.  Pt was motivated to participate in therapy, stating "I don't want to give up." Transitioned to EOB with mod I, then sit > stand with supervision without AD. Ambulatory transfer to toilet in bathroom using RW with supervision. Able to void (unmeasured) however unable to have BM. Pt reported nursing stopped his stool softener however felt he was beginning to get constipated- nursing/MD notified. Completed all toileting needs with supervision.  Ambulated to w/c with supervision. MD in room to discuss pt's progress with therapist engaging in discussion regarding pt's new orthostatics, as MD reports meds should not be affecting the BP at this point 2/2 changes made, therefore to re-continue use of abdominal binder vs just TEDS.   Pt preferring to return to bed with report of increase nausea symptoms, with a couple episodes of dry heaving/minimal fluid that surfaced at end of session. Therapist provided bedside support, trash can and therapeutic use of self. Pt returned to bed with mod I and remained upright in bed, with nursing at bedside at direct care hand off.   BP vitals 111/75  Upright in bed  130/90  EOB 98/72 Standing with knee high TEDS already donned; asymptomatic        Therapy Documentation Precautions:  Precautions Precautions: Fall Precaution Comments: orthostatic, monitor HR Other Brace: Has abdominal binder and TEDs Restrictions Weight Bearing Restrictions: No    Therapy/Group: Individual Therapy  Blase Mess, MS, OTR/L  05/20/2022, 9:04 AM

## 2022-05-20 NOTE — Progress Notes (Signed)
Physical Therapy Session Note  Patient Details  Name: [name] MRN: [id number] Date of Birth: 26-Mar-1956  Today's Date: 05/20/2022 PT Individual Time: 7341-9379  PT Individual Time Calculation (min): 40 min  PT Missed Time: 30 minutes PT Missed Time Reason: MRI  Short Term Goals: Week 1:  PT Short Term Goal 1 (Week 1): STG=LTG secondary to ELOS  Skilled Therapeutic Interventions/Progress Updates:   Treatment Session 1 Received pt semi-reclined in bed with ted hose and abdominal binder donned. Pt agreeable to PT treatment but reported feeling nauseous and confused throughout session. RN notified and present to administer anti-nausea medication during session. Session with emphasis on functional mobility/transfers, dynamic standing balance/coordination, and gait training. Pt transferred semi-reclined<>sitting EOB with HOB elevated and supervision and reported increased nausea, requiring multiple rest breaks. Pt performed all transfers with RW and CGA/close supervision throughout session. Pt ambulated 163ft x 2 trials (trial 1 with RW and close supervision and trial 2 without AD and CGA) with no LOB noted but pt ambulates with narrow BOS and with decreased cadence - reported feeling SOB after ambulating. Then ambulated 33ft without AD and CGA to recliner and concluded session with pt sitting in recliner, needs within reach, and chair pad alarm on. RN present at bedside.   Orthostatics: BP supine: 136/86 - mild dizziness BP sitting EOB: 108/83 - increased nausea BP standing EOB: 90/71 - denied dizziness but reported increased nausea and SOB - SPO2 99%  BP after ambulating: 144/82 decreasing to 136/90 with rest - pt reporting mild lightheadedness while ambulating   Treatment Session 2 Pt off unit for MRI. Will attempt to make up time as able. 30 minutes missed of skilled physical therapy.   Therapy Documentation Precautions:  Precautions Precautions: Fall Precaution Comments:  orthostatic, monitor HR Other Brace: Has abdominal binder and TEDs Restrictions Weight Bearing Restrictions: No  Therapy/Group: Individual Therapy Alfonse Alpers PT, DPT  05/20/2022, 7:04 AM

## 2022-05-21 DIAGNOSIS — I951 Orthostatic hypotension: Secondary | ICD-10-CM | POA: Diagnosis not present

## 2022-05-21 MED ORDER — PROCHLORPERAZINE MALEATE 5 MG PO TABS
5.0000 mg | ORAL_TABLET | Freq: Four times a day (QID) | ORAL | Status: DC | PRN
  Administered 2022-05-22 – 2022-05-23 (×2): 10 mg via ORAL
  Filled 2022-05-21 (×3): qty 2

## 2022-05-21 MED ORDER — SCOPOLAMINE 1 MG/3DAYS TD PT72
1.0000 | MEDICATED_PATCH | TRANSDERMAL | Status: DC
  Administered 2022-05-21 – 2022-05-24 (×2): 1.5 mg via TRANSDERMAL
  Filled 2022-05-21 (×3): qty 1

## 2022-05-21 MED ORDER — PROCHLORPERAZINE EDISYLATE 10 MG/2ML IJ SOLN: 5.0000 mg | Freq: Four times a day (QID) | INTRAMUSCULAR | Status: DC | PRN

## 2022-05-21 MED ORDER — PROCHLORPERAZINE 25 MG RE SUPP: 12.5000 mg | Freq: Four times a day (QID) | RECTAL | Status: DC | PRN

## 2022-05-21 MED ORDER — POLYETHYLENE GLYCOL 3350 17 G PO PACK
17.0000 g | PACK | Freq: Every day | ORAL | Status: DC
  Administered 2022-05-21 – 2022-05-22 (×2): 17 g via ORAL
  Filled 2022-05-21 (×5): qty 1

## 2022-05-21 MED ORDER — ONDANSETRON HCL 4 MG/2ML IJ SOLN
4.0000 mg | Freq: Four times a day (QID) | INTRAMUSCULAR | Status: DC | PRN
  Administered 2022-05-21 – 2022-05-24 (×8): 4 mg via INTRAVENOUS
  Filled 2022-05-21 (×8): qty 2

## 2022-05-21 NOTE — Progress Notes (Signed)
Physical Therapy Session Note  Patient Details  Name: [name] MRN: [id number] Date of Birth: 09/24/1955  Today's Date: 05/21/2022 PT Individual Time: 0915-0956 PT Individual Time Calculation (min): 41 min   Short Term Goals: Week 1:  PT Short Term Goal 1 (Week 1): STG=LTG secondary to ELOS  Skilled Therapeutic Interventions/Progress Updates:   Received pt semi-reclined in bed with RN present at bedside administering mediations. Pt agreeable to PT treatment and reported pain 5/10 in lower R ribcage but mostly c/o nausea, reporting dry heaving multiple times this morning. Session with emphasis on discharge planning, functional mobility/transfers, generalized strengthening and endurance, dynamic standing balance/coordination, stair navigation, and gait training. Went through pain interference questionnaire, sensation, and MMT, then pt transferred semi-reclined<>sitting EOB independently. Stood with RW and mod I and ambulated 147ft x 2 trials with RW and mod I to/from main therapy gym. Discussed stair navigation and pt reported having 6 STE with 2 rails (but they are too far apart to reach both) and 13 steps upstairs with bilateral rails. Pt navigated 12 6in steps with 2 rails and supervision ascending and descending with a step through pattern. Educated pt on technique for navigating steps with BUE support on 1 rail using a lateral stepping technique and pt navigated additional 8 steps with 1 handrail using this technique; then additional 4 steps with 1 rail ascending/descending forwards with close supervision. Pt able to stand and pick up small cup from floor without AD and CGA. Returned to room and pt ambulated into bathroom with RW mod I. Pt able to manage clothing with supervision and concluded session with pt sitting on commode with NT notified and attending to care due to time restrictions.   Therapy Documentation Precautions:  Precautions Precautions: Fall Precaution Comments:  orthostatic, monitor HR Other Brace: Has abdominal binder and TEDs Restrictions Weight Bearing Restrictions: No  Therapy/Group: Individual Therapy Alfonse Alpers PT, DPT  05/21/2022, 7:00 AM

## 2022-05-21 NOTE — Plan of Care (Signed)
  Problem: RH Memory Goal: LTG Patient will use memory compensatory aids to (SLP) Description: LTG:  Patient will use memory compensatory aids to recall biographical/new, daily complex information with cues (SLP) Outcome: Completed/Met   Problem: RH Attention Goal: LTG Patient will demonstrate this level of attention during functional activites (SLP) Description: LTG:  Patient will will demonstrate this level of attention during functional activites (SLP) Outcome: Completed/Met   

## 2022-05-21 NOTE — Progress Notes (Signed)
Call placed to [facility] about moving up follow-up appointment. SW has faxed new MRI brain report to Dr. Aniceto Boss at [phone]. Current appointment set for October 12th. Doreatha Martin, NP was notified and the patient will be contacted regarding appointment change.

## 2022-05-21 NOTE — Progress Notes (Signed)
Physical Therapy Discharge Summary  Patient Details  Name: [name] MRN: [id number] Date of Birth: 1956-08-02  Date of Discharge from PT service:May 23, 2022  {CH[facility] IP REHAB PT TIME CALCULATION:[id number]}  Patient has met 6 of 7 long term goals due to improved activity tolerance, improved balance, improved postural control, increased strength, and improved coordination.  Patient to discharge at an ambulatory level Modified Independent using RW. Patient's care partner is independent to provide the necessary physical assistance at discharge.  Reasons goals not met: Pt did not meet stair goal of mod I as pt currently requires supervision for stair navigation due to occasional lightheadedness and increased nausea.   Recommendation:  Patient will benefit from ongoing skilled PT services in outpatient setting to continue to advance safe functional mobility, address ongoing impairments in transfers, generalized strengthening and endurance, dynamic standing balance/coordination, gait training, and to minimize fall risk.  Equipment: No equipment provided - already has RW  Reasons for discharge: treatment goals met  Patient/family agrees with progress made and goals achieved: Yes  PT Discharge Precautions/Restrictions Precautions Precautions: Fall Precaution Comments: orthostatics, monitor HR Other Brace: abdominal binder and TEDs Restrictions Weight Bearing Restrictions: No Pain Interference Pain Interference Pain Effect on Sleep: 3. Frequently Pain Interference with Therapy Activities: 2. Occasionally Pain Interference with Day-to-Day Activities: 2. Occasionally Cognition Overall Cognitive Status: Impaired/Different from baseline Arousal/Alertness: Awake/alert Orientation Level: Oriented X4 Year: 2023 Month: September Day of Week: Correct Memory: Impaired Awareness: Appears intact Problem Solving: Appears intact Safety/Judgment: Appears  intact Sensation Sensation Light Touch: Appears Intact Hot/Cold: Appears Intact Proprioception: Appears Intact Stereognosis: Not tested Additional Comments: pt denied any numbness/tingling/burning in LEs. Pt reports LUE tremors improved then recently worsened Coordination Gross Motor Movements are Fluid and Coordinated: Yes Fine Motor Movements are Fluid and Coordinated: No Coordination and Movement Description: generalized weakness/deconditioning Finger Nose Finger Test: Tremorous on LUE, mild dysmetria bilaterally Heel Shin Test: slow bilaterally but [facility] Motor  Motor Motor: Within Functional Limits Motor - Skilled Clinical Observations: generalized weakness/deconditioning  Mobility Bed Mobility Bed Mobility: Rolling Right;Rolling Left;Sit to Supine;Supine to Sit Rolling Right: Independent Rolling Left: Independent Supine to Sit: Independent Sit to Supine: Independent Transfers Transfers: Sit to Stand;Stand to Sit;Stand Pivot Transfers Sit to Stand: Independent with assistive device Stand to Sit: Independent with assistive device Stand Pivot Transfers: Independent with assistive device Transfer (Assistive device): Rolling walker Locomotion  Gait Ambulation: Yes Gait Assistance: Independent with assistive device Gait Distance (Feet): 150 Feet Assistive device: Rolling walker Gait Gait: Yes Gait Pattern: Impaired Gait Pattern: Narrow base of support Gait velocity: Decreased Stairs / Additional Locomotion Stairs: Yes Stairs Assistance: Supervision/Verbal cueing Stair Management Technique: Two rails Number of Stairs: 12 Height of Stairs: 6 Ramp: Supervision/Verbal cueing Pick up small object from the floor assist level: Contact Guard/Touching assist Pick up small object from the floor assistive device: small cup without AD Wheelchair Mobility Wheelchair Mobility: No  Trunk/Postural Assessment  Cervical Assessment Cervical Assessment: Within Functional  Limits Thoracic Assessment Thoracic Assessment: Exceptions to [facility] (thoracic rounding) Lumbar Assessment Lumbar Assessment: Exceptions to [facility] (posterior pelvic tilt) Postural Control Postural Control: Within Functional Limits  Balance Balance Balance Assessed: Yes Static Sitting Balance Static Sitting - Balance Support: Feet supported;Bilateral upper extremity supported Static Sitting - Level of Assistance: 7: Independent Dynamic Sitting Balance Dynamic Sitting - Balance Support: Feet supported;No upper extremity supported Dynamic Sitting - Level of Assistance: 7: Independent Static Standing Balance Static Standing - Balance Support: Bilateral upper extremity supported;During functional activity (RW)  Static Standing - Level of Assistance: 6: Modified independent (Device/Increase time) Dynamic Standing Balance Dynamic Standing - Balance Support: Bilateral upper extremity supported;During functional activity (RW) Dynamic Standing - Level of Assistance: 6: Modified independent (Device/Increase time) Extremity Assessment  RLE Assessment RLE Assessment: Exceptions to [facility] RLE Strength Right Hip Flexion: 4-/5 Right Hip ABduction: 3/5 Right Hip ADduction: 3+/5 Right Knee Flexion: 3+/5 Right Ankle Dorsiflexion: 4-/5 Right Ankle Plantar Flexion: 4-/5 LLE Assessment LLE Assessment: Exceptions to [facility] LLE Strength Left Hip Flexion: 4-/5 Left Hip ABduction: 3+/5 Left Hip ADduction: 3/5 Left Knee Flexion: 3/5 Left Ankle Dorsiflexion: 3+/5 Left Ankle Plantar Flexion: 3+/5   Alfonse Alpers PT, DPT  05/21/2022, 7:04 AM

## 2022-05-21 NOTE — Progress Notes (Signed)
Zofran added for refractory nausea. He is intolerant of prednisone but has had IV steroids in the past without SE. Dicussed short course of IV decadron with Dr. Adam Phenix and patient-->he would like to hold off on this. Will consult Palliative care for input.

## 2022-05-21 NOTE — Progress Notes (Signed)
PROGRESS NOTE   Subjective/Complaints: Continues to feel nauseous this morning despite compazine, will trial scopolamine patch and zofran as well Had hard BM yesterday- will schedule miralax     05/21/2022    3:13 AM 05/20/2022    7:31 PM 05/20/2022    3:00 PM  Vitals with BMI  Systolic [phone]  Diastolic 80 64 64  Pulse 94 70 92     ROS: Denies fevers, chills, abdominal pain, diarrhea, SOB, cough, chest pain, new weakness or paraesthesias.  +pain from metastases, +lightheadedness with standing- improved, +constipation  Objective:   MR BRAIN W WO CONTRAST  Result Date: 05/20/2022 CLINICAL DATA:  Brain metastases suspected; known metastatic disease EXAM: MRI HEAD WITHOUT AND WITH CONTRAST TECHNIQUE: Multiplanar, multiecho pulse sequences of the brain and surrounding structures were obtained without and with intravenous contrast. CONTRAST:  6 mL Vueway COMPARISON:  MRI brain 03/16/2022, CT head 05/04/2022 FINDINGS: Brain: Multiple enhancing lesions are again noted. Redemonstrated left cerebellar lesion measures 7 x 7 x 6 mm (AP x TR x CC) (series 10, image 10 and series 11, image 12), previously 7 x 6 x 6 mm, overall unchanged, with slightly increased surrounding edema. Right frontal lesion measures 7 x 6 x 8 mm (series 10, image 45 and series 11, image 19), previously 6 x 7 x 6 mm with increased surrounding edema. 3 mm left thalamic/internal capsule lesion (series 10, image 27), unchanged. 3 mm enhancing lesion in the vermis (series 10, image 20), with minimal associated edema, previously 2 mm. New 3 mm lesion in the medial right temporal lobe (series 10, image 21), with mild associated edema. No restricted diffusion to suggest acute or subacute infarct. No acute hemorrhage, mass effect, or midline shift. Scattered foci of hemosiderin deposition may be related to previously noted and now resolved lesions. T2 hyperintense signal in the  periventricular white matter, likely the sequela of mild chronic small vessel ischemic disease. No hydrocephalus or extra-axial collection. Vascular: Normal arterial flow voids. Normal arterial and venous enhancement. Skull and upper cervical spine: Normal marrow signal. Sinuses/Orbits: Mild mucosal thickening in the ethmoid air cells. The orbits are unremarkable. Other: Fluid in the right-greater-than-left mastoid air cells. IMPRESSION: 1. New 3 mm enhancing lesion in the medial right temporal lobe. 2. Slight interval increase in the size of previously noted right frontal lesion, with increased associated edema. 3. Other previously present enhancing lesions are overall unchanged. Electronically Signed   By: Merilyn Baba M.D.   On: 05/20/2022 13:29   No results for input(s): "WBC", "HGB", "HCT", "PLT" in the last 72 hours.  No results for input(s): "NA", "K", "CL", "CO2", "GLUCOSE", "BUN", "CREATININE", "CALCIUM" in the last 72 hours.   Intake/Output Summary (Last 24 hours) at 05/21/2022 1206 Last data filed at 05/21/2022 0817 Gross per 24 hour  Intake 237 ml  Output --  Net 237 ml        Physical Exam:  Gen: no distress, normal appearing HENT: No JVD. Neck Supple. Trachea midline. Atraumatic. +temporal wasting Eyes: PERRLA. EOMI. Visual fields grossly intact.  Cardiovascular: RRR, no murmurs/rub/gallops. No Edema. Peripheral pulses 2+  Respiratory: CTAB. No rales, rhonchi, or wheezing. On RA.  Abdomen: + bowel sounds, normoactive. No distention or tenderness. + abdominal binder GU: Not examined.  Skin: C/D/I. No apparent lesions. MSK:      No apparent deformity.      Strength:                RUE: 4/5 SA, 4/5 EF, 4/5 EE, 5/5 WE, 5/5 FF, 5/5 FA                 LUE: 5/5 SA, 5/5 EF, 5/5 EE, 5/5 WE, 5/5 FF, 5/5 FA                 RLE: 4/5 HF, 4/5 KE, 5/5 DF, 5/5 EHL, 5/5 PF                 LLE:  5/5 HF, 5/5 KE, 5/5 DF, 5/5 EHL, 5/5 PF    Neurologic exam:  Cognition: AAO to person,  place, time and event. Cannot ad change, can perform serial 7s. Cannot spell world backwards. Improved balance Language: Fluent, No substitutions or neoglisms. No dysarthria. Names 3/3 objects correctly.  Memory: Recalls 1/3 objects at 5 minutes.   Insight: Good insight into current condition.  Mood: Appropriate affect, positive mood Sensation: To light touch intact in BL UEs and LEs with exception of right foot plantar, left 5th digit Reflexes: 2+ in BL UE and LEs. Negative Hoffman's and babinski signs bilaterally.  CN: 2-12 grossly intact.  Coordination: + BL UE intention tremors-more pronounced. No ataxia on FTN, HTS bilaterally.  Spasticity: MAS 0 in all extremities  Standing with minimal lightheadedness   Assessment/Plan: 1. Functional deficits which require 3+ hours per day of interdisciplinary therapy in a comprehensive inpatient rehab setting. Physiatrist is providing close team supervision and 24 hour management of active medical problems listed below. Physiatrist and rehab team continue to assess barriers to discharge/monitor patient progress toward functional and medical goals  Care Tool:  Bathing    Body parts bathed by patient: Right arm, Left arm, Chest, Abdomen, Front perineal area, Buttocks, Right upper leg, Left upper leg, Face   Body parts bathed by helper: Right lower leg, Left lower leg     Bathing assist Assist Level: Minimal Assistance - Patient > 75%     Upper Body Dressing/Undressing Upper body dressing   What is the patient wearing?: Pull over shirt    Upper body assist Assist Level: Set up assist    Lower Body Dressing/Undressing Lower body dressing      What is the patient wearing?: Pants     Lower body assist Assist for lower body dressing: Contact Guard/Touching assist     Toileting Toileting    Toileting assist Assist for toileting: Supervision/Verbal cueing     Transfers Chair/bed transfer  Transfers assist     Chair/bed  transfer assist level: Independent with assistive device Chair/bed transfer assistive device: Programmer, multimedia   Ambulation assist      Assist level: Independent with assistive device Assistive device: Walker-rolling Max distance: 158ft   Walk 10 feet activity   Assist     Assist level: Independent with assistive device Assistive device: Walker-rolling   Walk 50 feet activity   Assist    Assist level: Independent with assistive device Assistive device: Walker-rolling    Walk 150 feet activity   Assist    Assist level: Independent with assistive device Assistive device: Walker-rolling    Walk 10 feet on uneven surface  activity   Assist  Assist level: Minimal Assistance - Patient > 75% Assistive device: Walker-rolling   Wheelchair     Assist Is the patient using a wheelchair?: No             Wheelchair 50 feet with 2 turns activity    Assist            Wheelchair 150 feet activity     Assist          Blood pressure 124/80, pulse 94, temperature 98.3 F (36.8 C), temperature source Oral, resp. rate 18, height 5' 9.02" (1.753 m), weight 59.5 kg, SpO2 99 %.  Medical Problem List and Plan: 1. 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  Antithrombotics: -DVT/anticoagulation:  Pharmaceutical: Xarelto             -antiplatelet therapy: none   3. 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 Tylenol, Robaxin             - MS Contin 60 mg  BID - home dose             - Dilaudid 4 mg q 4 hours as needed -> increased to home dose 4/8 mg Q4H PRN  D/c IV dilaudid since patient not requiring.             - Pain meds managed by cancer center pain management; will need to inform them of any changes at discharge   4. Anxiety: d/c hydroxyzine due to confusion             -antipsychotic agents: n/a  -discussed starting SSRI and he is agreeable             -depression: Cymbalta held due to hypotension; once improved, patient may benefit from change to Mirtazepine for depression, sleep, and appetite. Do appreciate symptoms of anxiety may be more impactful. Will monitor for now.              - anxiety: hydroxyzine 10 mg PRN; Ativan held due to hypotension             - Added Trazadone 50 mg QHS PRN for insomnia             - Neuropsych consult placed given cognitive deficits, anxiety, and depression s/p rad/chemotherapy for brain mets - on schedule for Monday   5. Neuropsych/cognition: This patient is capable of making decisions on his own behalf.           - SLP consult placed    6. Skin/Wound Care: routine skin care checks   7. Malnutrition (endorses 70+ lb unintentional weight loss in <1 year): routine Is and Os and follow-up chemistries         - Prealbumin low, discussed with patient, encouraged choosing high protein foods.             - Difficulty with  POs due to nausea; PRN compazine usually helpful, less so today  8: Large cell neuroendocrine carcinoma with brain, bone metastasis:             -clinical trial med held during admission   9: Orthostatic hypotension: continue midodrine 10 mg TID, advised drinking water in the morning before getting up for therapies             -placed order for smaller abdominal binder.              - abdominal binder and TED only during the daytime - changed to when up to bedside or OOB due to discomfort. Reinforced not needed in bed 9/17.              - +/- Mestinon per cardiology; orthostatics  improving, hold off for now             - no beta blockers 10: CAD s/p CABG 2017: Stable.  Currently off beta-blocker   11: Paroxysmal atrial fibrillation: continue Xarelto             -no BB due to significant orthostatic hypotension   12: Anemia, chronic: follow-up CBC   13. Vitamin D deficiency: level 17.42: start ergocalciferol 50,000U once per week.    14: Code status: DNR/DNI - following with palliative   15. Confusion: d/c trazodone and atarax, UA and UC ordered and returned negative, discussed with patient. Likely 2/2 to medicines and has imroved with discontinuation  16. Constipation: start colace 100mg  TID. Add miralax daily.   17. Nausea: continue compazine and zofran prn and add scopolamine patch.  18. Worsening essential tremors, intermittent confusion: discussed getting brain MRI to assess for metastases and he is agreeable    LOS: 8 days A FACE TO FACE EVALUATION WAS PERFORMED  Martha Clan P Rachelle Edwards 05/21/2022, 12:06 PM

## 2022-05-21 NOTE — Progress Notes (Signed)
Speech Language Pathology Discharge Summary  Patient Details  Name: [name] MRN: [id number] Date of Birth: 1956/07/10  Date of Discharge from SLP service:May 21, 2022  Today's Date: 05/21/2022 SLP Individual Time: 8022-1798 SLP Individual Time Calculation (min): 44 min  Skilled Therapeutic Interventions:  Pt greeted in bed on arrival and accompanied by nurse. SLP facilitated session by reviewing compensatory memory strategies including use of external aids such as phone, calendar, and written notes. Pt utilized notes written down by nurse on when pain medications are due with independence. Pt took blood pressure with personal wrist monitor (112/78) and independently utilized BP and pulse "cheat sheet" to determine BP was [facility]. Pt continues to benefit from sup A cues to utilize phone features. Staff from hospital quality team came by to ask a few questions regarding pt's hospitalization and satisfaction. Pt was able to sustain maintain attention during conversation without any verbal redirection necessary, as observed during earlier encounters. Overall, pt has demonstrated excellent use of compensatory memory strategies and carry over of education topics. Anticipate progress may continue to fluctuate with current cancer diagnosis. Pt appears to have excellent support from family and friends, and will have necessary level of support at discharge from a cognitive perspective. Plan to discharge from [place] services. All goals met. Pt verbalized agreement with plan.  Patient has met 2 of 2 long term goals.  Patient to discharge at overall Modified Independent;Supervision level.  Reasons goals not met:     Clinical Impression/Discharge Summary: Pt has demonstrated excellent progress towards cognitive goals meeting 2 out of 2 long-term goals this admission in regards to sustained attention and use of external memory strategies. Pt is currently at the independent-to-mod I level for use of strategies  and has exceeded LTG expectations. Pt education completed. Handouts were provided to reinforce topics. Care partner is independent to provide the necessary physical and cognitive assistance at discharge. Recommend supervision and assistance with completion of all iADL tasks to include medication and financial management due to effects of agressive cancer treatment on cognition impacting memory and processing. Ongoing SLP services at next level of care do not appear clinically indicated at this time.   Care Partner:  Caregiver Able to Provide Assistance: Yes  Type of Caregiver Assistance: Cognitive;Physical  Recommendation:  None      Equipment: None   Reasons for discharge: Treatment goals met   Patient/Family Agrees with Progress Made and Goals Achieved: Yes    [name] [name] 05/21/2022, 4:05 PM

## 2022-05-21 NOTE — Progress Notes (Signed)
Occupational Therapy Session Note  Patient Details  Name: [name] MRN: [id number] Date of Birth: 04-16-1956  Today's Date: 05/21/2022 OT Individual Time: 1103-1200 & 1300-1400 OT Individual Time Calculation (min): 57 min & 60 min   Short Term Goals: Week 1:  OT Short Term Goal 1 (Week 1): STG = LTG 2/2 ELOS  Skilled Therapeutic Interventions/Progress Updates:  Session 1 Skilled OT intervention completed with focus on d/c planning, functional transfers, nausea and vomiting management. Pt received upright in bed, no c/o pain however did report nausea/dry heaving that had been occurring since early AM til present session. Therapist offered ginger ale and graham crackers to soothe symptoms. Pt was able to eat 1 cracker, and a few sips of ginger ale. Nurse notified of pt request for more nausea meds (despite therapist reviewing med chart and informing pt that he was not due for another 2 hours).  Pt agreeable to try session. Completed all sit > stands intermittently with and without RW with supervision then ambulatory transfers with RW with supervision. Ambulated to ortho gym, then completed mod I care transfer with no cues needed or difficulty demonstrated. Ambulated with supervision up inclined ramp and back down, then supervision using RW on "mulch" pad. Discussed how this could relate to ambulating in the grass/different terrains around the home and in the community.  Ambulated to ADL apartment, got in and out of standard bed with mod I. No difficulty, with pt stating that his bed about the same height and does not anticipate trouble with this at home.   Upon transfer to w/c, pt reported feeling like he could vomit. Therapist retrieved emesis bag for pt, with pt having several episodes of dry heaving and active vomit however only a small amount. Transported pt dependently in w/c back to room, with nurse notified of pt status.  BP assessed, WNL at 130/75. Abdominal binder and TEDS already  donned. Pt had questions regarding when to check BP at home, with therapist advising to assess BP at bodily transitions including EOB > standing, and every couple hours however more frequently if TEDs and binder not donned (which therapist advised against).  Assisted pt back to bed with supervision without AD, then completed mod I bed mobility. Pt remained semi-upright in bed, with bed alarm on and all needs in reach at end of session.  Session 2 Skilled OT intervention completed with focus on ambulatory endurance within a community setting and cognitive strategies all needed to promote independence in BADLs. Pt received upright in bed, no c/o pain, reported feeling better after receiving nausea meds and was able to eat a little bit of his lunch.  Completed sit > stand using RW with supervision then ambulated from room > elevators (managed all buttons independently) > outside courtyard with supervision A, cues needed for direction only.  Seated in w/c, pt participated in cognitive problem solving and sequencing utilizing the following strategies: -responding with solutions to home safety cards prompts, I.e "what 2 items in your home get hot once you turn them on?" Pt demonstrated min difficulty selecting items and with his responses however with min guidance was able to identify appropriate responses -Utilizing deck of number/colored cards to sort numerically 1-12 with no difficulty, then sorting numbers 1-12 however alternating colors of the numbers for higher level sequencing and alternating attention. Pt required increased time for this strategy however made mod-max mistakes.  Transported dependently in w/c back to room for time, and transferred back to bed with supervision, bed  mobility with mod I. Pt remained upright in bed, with nurse in room at bedside at direct care handoff.   Therapy Documentation Precautions:  Precautions Precautions: Fall Precaution Comments: orthostatics, monitor  HR Other Brace: abdominal binder and TEDs Restrictions Weight Bearing Restrictions: No    Therapy/Group: Individual Therapy  Blase Mess, MS, OTR/L  05/21/2022, 2:06 PM

## 2022-05-22 DIAGNOSIS — I480 Paroxysmal atrial fibrillation: Secondary | ICD-10-CM | POA: Diagnosis not present

## 2022-05-22 DIAGNOSIS — C7A8 Other malignant neuroendocrine tumors: Secondary | ICD-10-CM | POA: Diagnosis not present

## 2022-05-22 DIAGNOSIS — I951 Orthostatic hypotension: Secondary | ICD-10-CM | POA: Diagnosis not present

## 2022-05-22 DIAGNOSIS — M79671 Pain in right foot: Secondary | ICD-10-CM

## 2022-05-22 MED ORDER — GABAPENTIN 100 MG PO CAPS
100.0000 mg | ORAL_CAPSULE | Freq: Three times a day (TID) | ORAL | Status: DC
  Administered 2022-05-22 – 2022-05-24 (×6): 100 mg via ORAL
  Filled 2022-05-22 (×6): qty 1

## 2022-05-22 NOTE — Progress Notes (Addendum)
Occupational Therapy Session Note  Patient Details  Name: [name] MRN: [id number] Date of Birth: 05-26-1956  Today's Date: 05/22/2022 OT Individual Time: 0920-1000 OT Individual Time Calculation (min): 40 min    Short Term Goals: Week 1:  OT Short Term Goal 1 (Week 1): STG = LTG 2/2 ELOS   Skilled Therapeutic Interventions/Progress Updates:    Subjective: Pt states he had a really sharp pain in his right foot.  Only a nagging pain right now.  He states the doctor moved his foot around and it didn't hurt this AM. Pt also reports feeling nervous about going home due to past falls have felt sudden without warnings or symptoms.  He reports he just recently bought a blood pressure wrist cuff monitor.   Objective:  Pt semi reclined in bed. Assessed patients independence with using personal blood pressure monitor.  Patient assessed own and reading varied by 20 points systolic lower than readings taken by dynamap and then confirmed by therapist taking BP manually. Attempted changing position of arm per manufacturers instructions, however readings still inconsistent from personal cuff.  Therapist recommended automatic upper arm cuff device to ensure more consistent readings.  Patients blood pressure in semi reclined position was 98/56 mmHg with thigh high TEDs donned.  Made nurse aware and encouraged patient on increased fluid intake.  Educated patient to try to drink 6-8 cups of water per day, as pt reports he only has been drinking 3 to 4 per day.Direct hand off to nurse.    Assessment:  Pt unable to achieve accurate readings with current personal BP monitoring device, which could pose potential safety hazard when dc to home setting. Pt with low fluid intake needing further encouragement and education on importance of regular hydration to promote normal BP readings, and therefore increasing safety and participation during ADLs.   Plan: Pt would benefit from further training on self monitoring of BP  prior to and during ADLs for fall prevention.  Therapy Documentation Precautions:  Precautions Precautions: Fall Precaution Comments: orthostatics, monitor HR Other Brace: abdominal binder and TEDs Restrictions Weight Bearing Restrictions: No    Therapy/Group: Individual Therapy  Ezekiel Slocumb 05/22/2022, 4:12 PM

## 2022-05-22 NOTE — Progress Notes (Addendum)
PROGRESS NOTE   Subjective/Complaints: Pt had sharp pain along the top of right foot in middle of night. It has almost completely resolved this morning. Nurse also recorded orthostatic vs today. Pt was asymptomatic  ROS: Patient denies fever, rash, sore throat, blurred vision, dizziness, nausea, vomiting, diarrhea, cough, shortness of breath or chest pain,  back/neck pain, headache, or mood change.    Objective:   MR BRAIN W WO CONTRAST  Result Date: 05/20/2022 CLINICAL DATA:  Brain metastases suspected; known metastatic disease EXAM: MRI HEAD WITHOUT AND WITH CONTRAST TECHNIQUE: Multiplanar, multiecho pulse sequences of the brain and surrounding structures were obtained without and with intravenous contrast. CONTRAST:  6 mL Vueway COMPARISON:  MRI brain 03/16/2022, CT head 05/04/2022 FINDINGS: Brain: Multiple enhancing lesions are again noted. Redemonstrated left cerebellar lesion measures 7 x 7 x 6 mm (AP x TR x CC) (series 10, image 10 and series 11, image 12), previously 7 x 6 x 6 mm, overall unchanged, with slightly increased surrounding edema. Right frontal lesion measures 7 x 6 x 8 mm (series 10, image 45 and series 11, image 19), previously 6 x 7 x 6 mm with increased surrounding edema. 3 mm left thalamic/internal capsule lesion (series 10, image 27), unchanged. 3 mm enhancing lesion in the vermis (series 10, image 20), with minimal associated edema, previously 2 mm. New 3 mm lesion in the medial right temporal lobe (series 10, image 21), with mild associated edema. No restricted diffusion to suggest acute or subacute infarct. No acute hemorrhage, mass effect, or midline shift. Scattered foci of hemosiderin deposition may be related to previously noted and now resolved lesions. T2 hyperintense signal in the periventricular white matter, likely the sequela of mild chronic small vessel ischemic disease. No hydrocephalus or extra-axial  collection. Vascular: Normal arterial flow voids. Normal arterial and venous enhancement. Skull and upper cervical spine: Normal marrow signal. Sinuses/Orbits: Mild mucosal thickening in the ethmoid air cells. The orbits are unremarkable. Other: Fluid in the right-greater-than-left mastoid air cells. IMPRESSION: 1. New 3 mm enhancing lesion in the medial right temporal lobe. 2. Slight interval increase in the size of previously noted right frontal lesion, with increased associated edema. 3. Other previously present enhancing lesions are overall unchanged. Electronically Signed   By: Merilyn Baba M.D.   On: 05/20/2022 13:29   No results for input(s): "WBC", "HGB", "HCT", "PLT" in the last 72 hours.  No results for input(s): "NA", "K", "CL", "CO2", "GLUCOSE", "BUN", "CREATININE", "CALCIUM" in the last 72 hours.   Intake/Output Summary (Last 24 hours) at 05/22/2022 0924 Last data filed at 05/22/2022 0805 Gross per 24 hour  Intake 960 ml  Output --  Net 960 ml        Physical Exam:  Constitutional: No distress . Vital signs reviewed. HEENT: NCAT, EOMI, oral membranes moist Neck: supple Cardiovascular: RRR without murmur. No JVD    Respiratory/Chest: CTA Bilaterally without wheezes or rales. Normal effort    GI/Abdomen: BS +, non-tender, non-distended Ext: no clubbing, cyanosis, or edema Psych: pleasant and cooperative  GU: Not examined.  Skin: C/D/I. No apparent lesions. MSK:      very mild pain along dorsum of right  foot. No swelling or discoloration. Minimal pain with ankle and foot rom.      Strength:                RUE: 4/5 SA, 4/5 EF, 4/5 EE, 5/5 WE, 5/5 FF, 5/5 FA                 LUE: 5/5 SA, 5/5 EF, 5/5 EE, 5/5 WE, 5/5 FF, 5/5 FA                 RLE: 4/5 HF, 4/5 KE, 5/5 DF, 5/5 EHL, 5/5 PF                 LLE:  5/5 HF, 5/5 KE, 5/5 DF, 5/5 EHL, 5/5 PF    Neurologic exam:  Cognition: AAO to person, place, time and event.   Language: Fluent, No substitutions or neoglisms. No  dysarthria. Names 3/3 objects correctly.  Memory: fair functional memory   Insight: Good insight into current condition.  Sensation: To light touch intact in BL UEs and LEs with exception of right foot plantar, left 5th digit Reflexes: 2+ in BL UE and LEs. Negative Hoffman's and babinski signs bilaterally.  CN: 2-12 grossly intact.  Coordination: + BL UE intention tremors-more pronounced. No ataxia on FTN, HTS bilaterally.  Spasticity: MAS 0 in all extremities  Standing with minimal lightheadedness   Assessment/Plan: 1. Functional deficits which require 3+ hours per day of interdisciplinary therapy in a comprehensive inpatient rehab setting. Physiatrist is providing close team supervision and 24 hour management of active medical problems listed below. Physiatrist and rehab team continue to assess barriers to discharge/monitor patient progress toward functional and medical goals  Care Tool:  Bathing    Body parts bathed by patient: Right arm, Left arm, Chest, Abdomen, Front perineal area, Buttocks, Right upper leg, Left upper leg, Face   Body parts bathed by helper: Right lower leg, Left lower leg     Bathing assist Assist Level: Minimal Assistance - Patient > 75%     Upper Body Dressing/Undressing Upper body dressing   What is the patient wearing?: Pull over shirt    Upper body assist Assist Level: Set up assist    Lower Body Dressing/Undressing Lower body dressing      What is the patient wearing?: Pants     Lower body assist Assist for lower body dressing: Contact Guard/Touching assist     Toileting Toileting    Toileting assist Assist for toileting: Supervision/Verbal cueing     Transfers Chair/bed transfer  Transfers assist     Chair/bed transfer assist level: Independent with assistive device Chair/bed transfer assistive device: Programmer, multimedia   Ambulation assist      Assist level: Independent with assistive device Assistive  device: Walker-rolling Max distance: 143ft   Walk 10 feet activity   Assist     Assist level: Independent with assistive device Assistive device: Walker-rolling   Walk 50 feet activity   Assist    Assist level: Independent with assistive device Assistive device: Walker-rolling    Walk 150 feet activity   Assist    Assist level: Independent with assistive device Assistive device: Walker-rolling    Walk 10 feet on uneven surface  activity   Assist     Assist level: Minimal Assistance - Patient > 75% Assistive device: Walker-rolling   Wheelchair     Assist Is the patient using a wheelchair?: No  Wheelchair 50 feet with 2 turns activity    Assist            Wheelchair 150 feet activity     Assist          Blood pressure 108/63, pulse 81, temperature 97.9 F (36.6 C), temperature source Oral, resp. rate 18, height 5' 9.02" (1.753 m), weight 59.5 kg, SpO2 97 %.  Medical Problem List and Plan: 1. 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  Antithrombotics: -DVT/anticoagulation:  Pharmaceutical: Xarelto             -antiplatelet therapy: none   3. 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 Tylenol, Robaxin             - MS Contin 60 mg BID - home dose             - Dilaudid 4 mg q 4 hours as needed -> increased to home dose 4/8 mg Q4H PRN  D/c IV dilaudid since patient not requiring.             - Pain meds  managed by cancer center pain management; will need to inform them of any changes at discharge   -9/23 right foot pain. ? Neuropathic. No signs of anything m/s or vascular on exam. Sx almost completely resolved this am--->obsv for now 4. Anxiety: d/c hydroxyzine due to confusion             -antipsychotic agents: n/a  -discussed starting SSRI and he is agreeable             -depression: Cymbalta held due to hypotension; once improved, patient may benefit from change to Mirtazepine for depression, sleep, and appetite. Do appreciate symptoms of anxiety may be more impactful. Will monitor for now.              - anxiety: hydroxyzine 10 mg PRN; Ativan held due to hypotension             - Added Trazadone 50 mg QHS PRN for insomnia             - Neuropsych consult placed given cognitive deficits, anxiety, and depression s/p rad/chemotherapy for brain mets - on schedule for Monday   5. Neuropsych/cognition: This patient is capable of making decisions on his own behalf.           - SLP consult placed    6. Skin/Wound Care: routine skin care checks   7. Malnutrition (endorses 70+ lb unintentional weight loss in <1 year): routine Is and Os and follow-up chemistries         - Prealbumin low, discussed with patient, encouraged choosing high protein foods.             - Difficulty with POs due to nausea; PRN  compazine usually helpful, less so today  -intake appears to be a bit better this past week although still variable 8: Large cell neuroendocrine carcinoma with brain, bone metastasis:             -clinical trial med held during admission   9: Orthostatic hypotension: continue midodrine 10 mg TID, advised drinking water in the morning before getting up for therapies             -placed order for smaller abdominal binder.              - abdominal binder and TED only during the daytime - changed to when up to bedside or OOB due to discomfort. Reinforced not needed in bed 9/17.              - +/-  Mestinon per cardiology; orthostatics improving, hold off for now             - no beta blockers  9/23 +orthostatics this am  -need to keep pushing po fluids as well as other measures above. Pt denied any sx this morning. Consider florinef 10: CAD s/p CABG 2017: Stable.  Currently off beta-blocker   11: Paroxysmal atrial fibrillation: continue Xarelto             -no BB due to significant orthostatic hypotension   12: Anemia, chronic: follow-up CBC   13. Vitamin D deficiency: level 17.42: start ergocalciferol 50,000U once per week.    14: Code status: DNR/DNI - following with palliative   15. Confusion: d/c trazodone and atarax, UA and UC ordered and returned negative, discussed with patient. Likely 2/2 to medicines and has imroved with discontinuation  16. Constipation: start colace 100mg  TID. Add miralax daily.   +bm 9/22 17. Nausea: continue compazine and zofran prn and add scopolamine patch.       LOS: 9 days A FACE TO FACE EVALUATION WAS PERFORMED  Meredith Staggers 05/22/2022, 9:24 AM

## 2022-05-22 NOTE — Progress Notes (Signed)
Patients orthostatic vital signs are 104/64 HR 77 laying, 100/65 HR 102 sitting, 73/64 HR 132 standing at 0 mins, and 91/67 HR 98 standing at 3 mins. Patient has abdominal binder and thigh high TEDS on while obtaining orthostatic vitals. Patient states he only feels slightly dizzy upon standing. MD made aware.  Patient c/o right foot pain that is sharp 8/10. Right foot is painful to touch.  No swelling, no redness, pulses are intact, in right lower extremity. MD made aware.

## 2022-05-23 DIAGNOSIS — C7A8 Other malignant neuroendocrine tumors: Secondary | ICD-10-CM | POA: Diagnosis not present

## 2022-05-23 DIAGNOSIS — M79671 Pain in right foot: Secondary | ICD-10-CM | POA: Diagnosis not present

## 2022-05-23 DIAGNOSIS — I480 Paroxysmal atrial fibrillation: Secondary | ICD-10-CM | POA: Diagnosis not present

## 2022-05-23 DIAGNOSIS — I951 Orthostatic hypotension: Secondary | ICD-10-CM | POA: Diagnosis not present

## 2022-05-23 NOTE — Progress Notes (Signed)
Occupational Therapy Discharge Summary  Patient Details  Name: [name] MRN: [id number] Date of Birth: 1956-08-10  Date of Discharge from OT service:May 23, 2022     Patient has met 8 of 8 long term goals due to improved activity tolerance, improved balance, ability to compensate for deficits, and improved coordination.  Patient to discharge at overall Modified Independent level.  Patient's care partner is independent to provide the necessary cognitive assistance at discharge.  Patient and family have plan to monitor BP in home.    Reasons goals not met: NA  Recommendation:  Patient does not need continued OT services at this time.    Equipment: No equipment provided - Has all DME  Reasons for discharge: treatment goals met and discharge from hospital  Patient/family agrees with progress made and goals achieved: Yes  OT Discharge Precautions/Restrictions  Precautions Precautions: Fall Precaution Comments: orthostatics, monitor HR Other Brace: abdominal binder and TEDs Restrictions Weight Bearing Restrictions: No  Pain  3/10 right rib cage ADL ADL Eating: Modified independent Where Assessed-Eating: Chair Grooming: Modified independent Where Assessed-Grooming: Standing at sink Upper Body Bathing: Modified independent Where Assessed-Upper Body Bathing: Sitting at sink Lower Body Bathing: Modified independent Where Assessed-Lower Body Bathing: Sitting at sink, Standing at sink Upper Body Dressing: Modified independent (Device) Where Assessed-Upper Body Dressing: Chair Lower Body Dressing: Modified independent Where Assessed-Lower Body Dressing: Edge of bed Toileting: Modified independent Where Assessed-Toileting: Glass blower/designer: Diplomatic Services operational officer Method: Counselling psychologist: Grab bars, Other (comment) (RW) Tub/Shower Transfer: Metallurgist Method: Magazine features editor: Not  assessed Social research officer, government Method: Unable to assess ADL Comments: pt uses standard tub shower for bathing vs showering therefore completed step in and sit to tub floor transfer with overall CGA Vision Baseline Vision/History: 1 Wears glasses Patient Visual Report: No change from baseline Vision Assessment?: No apparent visual deficits Eye Alignment: Within Functional Limits Ocular Range of Motion: Within Functional Limits Tracking/Visual Pursuits: Decreased smoothness of horizontal tracking Saccades: Decreased speed of saccadic movement Perception  Perception: Within Functional Limits Praxis Praxis: Intact Cognition Cognition Overall Cognitive Status: Impaired/Different from baseline Arousal/Alertness: Awake/alert Orientation Level: Person;Place;Situation Person: Oriented Place: Oriented Situation: Oriented Memory: Impaired Memory Impairment: Retrieval deficit;Decreased recall of new information;Decreased short term memory Decreased Short Term Memory: Verbal complex Attention: Sustained Sustained Attention: Impaired Sustained Attention Impairment: Verbal complex;Functional complex Awareness: Appears intact Problem Solving: Appears intact Safety/Judgment: Appears intact Brief Interview for Mental Status (BIMS) Repetition of Three Words (First Attempt): 3 Temporal Orientation: Year: Correct Temporal Orientation: Month: Accurate within 5 days Temporal Orientation: Day: Correct Recall: "Sock": Yes, no cue required Recall: "Blue": Yes, no cue required Recall: "Bed": Yes, no cue required BIMS Summary Score: 15 Sensation Sensation Light Touch: Appears Intact Hot/Cold: Appears Intact Proprioception: Appears Intact Stereognosis: Not tested Coordination Gross Motor Movements are Fluid and Coordinated: Yes Fine Motor Movements are Fluid and Coordinated: No Coordination and Movement Description: generalized weakness/deconditioning Finger Nose Finger Test: Tremors on LUE,  mild dysmetria bilaterally Motor  Motor Motor: Within Functional Limits Motor - Discharge Observations: generalized weakness/deconditioning    Trunk/Postural Assessment  Cervical Assessment Cervical Assessment: Within Functional Limits Thoracic Assessment Thoracic Assessment: Exceptions to [facility] (laterally flexed on right - rib pain/weakness) Lumbar Assessment Lumbar Assessment: Exceptions to [facility]  Balance Static Sitting Balance Static Sitting - Balance Support: Feet unsupported;No upper extremity supported Static Sitting - Level of Assistance: 7: Independent Dynamic Sitting Balance Dynamic Sitting - Balance Support: Feet supported;No upper extremity supported  Dynamic Sitting - Level of Assistance: 7: Independent Dynamic Sitting - Balance Activities: Lateral lean/weight shifting;Forward lean/weight shifting;Reaching for objects Static Standing Balance Static Standing - Balance Support: Bilateral upper extremity supported;During functional activity Static Standing - Level of Assistance: 6: Modified independent (Device/Increase time) Static Standing - Comment/# of Minutes: Monitor BP as patient with significant BP decrease with trasnition sit to stand Dynamic Standing Balance Dynamic Standing - Balance Support: Bilateral upper extremity supported;During functional activity Dynamic Standing - Level of Assistance: 6: Modified independent (Device/Increase time) Extremity/Trunk Assessment RUE Assessment RUE Assessment: Within Functional Limits LUE Assessment LUE Assessment: Within Functional Limits General Strength Comments: 4+/5 proximally, WFL distally   Mariah Milling 05/23/2022, 4:42 PM

## 2022-05-23 NOTE — Progress Notes (Signed)
PROGRESS NOTE   Subjective/Complaints: Pt had further shooting, intermittent pains in feet yesterday, in different areas compared to yesterday's complaint. Gabapentin started which seems to have helped somewhat. Feels "stiffness" today, especially in neck after sleeping   ROS: Patient denies fever, rash, sore throat, blurred vision, dizziness, nausea, vomiting, diarrhea, cough, shortness of breath or chest pain,   headache, or mood change.    Objective:   No results found. No results for input(s): "WBC", "HGB", "HCT", "PLT" in the last 72 hours.  No results for input(s): "NA", "K", "CL", "CO2", "GLUCOSE", "BUN", "CREATININE", "CALCIUM" in the last 72 hours.   Intake/Output Summary (Last 24 hours) at 05/23/2022 0808 Last data filed at 05/23/2022 0750 Gross per 24 hour  Intake 600 ml  Output --  Net 600 ml        Physical Exam:  Constitutional: No distress . Vital signs reviewed. HEENT: NCAT, EOMI, oral membranes moist Neck: supple Cardiovascular: RRR without murmur. No JVD    Respiratory/Chest: CTA Bilaterally without wheezes or rales. Normal effort    GI/Abdomen: BS +, non-tender, non-distended Ext: no clubbing, cyanosis, or edema Psych: pleasant and cooperative  GU: Not examined.  Skin: C/D/I. No apparent lesions. MSK:      both feet with edema, pain, discoloration. Neuro:     Strength:                RUE and RLE 4- to 4/5. LUE and LLE 5/5.  Cognition: AAO to person, place, time and event.   Language: Fluent, No substitutions or neoglisms. No dysarthria. Names 3/3 objects correctly.  Memory: fair functional memory   Insight: Good insight into current condition.  Sensation: To light touch intact in BL UEs and LEs with exception of right foot plantar, left 5th digit Reflexes: 2+ in BL UE and LEs. Negative Hoffman's and babinski signs bilaterally.  CN: 2-12 grossly intact.  Coordination: + BL UE intention  tremors-more pronounced. No ataxia on FTN, HTS bilaterally.      Assessment/Plan: 1. Functional deficits which require 3+ hours per day of interdisciplinary therapy in a comprehensive inpatient rehab setting. Physiatrist is providing close team supervision and 24 hour management of active medical problems listed below. Physiatrist and rehab team continue to assess barriers to discharge/monitor patient progress toward functional and medical goals  Care Tool:  Bathing    Body parts bathed by patient: Right arm, Left arm, Chest, Abdomen, Front perineal area, Buttocks, Right upper leg, Left upper leg, Face   Body parts bathed by helper: Right lower leg, Left lower leg     Bathing assist Assist Level: Minimal Assistance - Patient > 75%     Upper Body Dressing/Undressing Upper body dressing   What is the patient wearing?: Pull over shirt    Upper body assist Assist Level: Set up assist    Lower Body Dressing/Undressing Lower body dressing      What is the patient wearing?: Pants     Lower body assist Assist for lower body dressing: Contact Guard/Touching assist     Toileting Toileting    Toileting assist Assist for toileting: Supervision/Verbal cueing     Transfers Chair/bed transfer  Transfers assist  Chair/bed transfer assist level: Independent with assistive device Chair/bed transfer assistive device: Programmer, multimedia   Ambulation assist      Assist level: Independent with assistive device Assistive device: Walker-rolling Max distance: 120ft   Walk 10 feet activity   Assist     Assist level: Independent with assistive device Assistive device: Walker-rolling   Walk 50 feet activity   Assist    Assist level: Independent with assistive device Assistive device: Walker-rolling    Walk 150 feet activity   Assist    Assist level: Independent with assistive device Assistive device: Walker-rolling    Walk 10 feet on  uneven surface  activity   Assist     Assist level: Minimal Assistance - Patient > 75% Assistive device: Walker-rolling   Wheelchair     Assist Is the patient using a wheelchair?: No             Wheelchair 50 feet with 2 turns activity    Assist            Wheelchair 150 feet activity     Assist          Blood pressure 106/76, pulse 96, temperature 97.9 F (36.6 C), temperature source Oral, resp. rate 18, height 5' 9.02" (1.753 m), weight 59.5 kg, SpO2 100 %.  Medical Problem List and Plan: 1. 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  Antithrombotics: -DVT/anticoagulation:  Pharmaceutical: Xarelto             -antiplatelet therapy: none   3. 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 Tylenol, Robaxin             - MS Contin 60 mg BID - home dose             - Dilaudid 4 mg q 4 hours as needed -> increased to home dose 4/8 mg Q4H PRN  D/c IV dilaudid since patient not requiring.             - Pain meds managed by cancer center pain management; will need to inform them of any changes at discharge   -9/24 shooting LE/foot pain. ?neuropathic--continue gabapentin 100mg  tid    -ordered Kpad for muscle tightness 4. Anxiety: d/c hydroxyzine due to confusion             -antipsychotic agents: n/a  -discussed starting SSRI and he is agreeable             -depression: Cymbalta held due to hypotension; once improved, patient may benefit from  change to Mirtazepine for depression, sleep, and appetite. Do appreciate symptoms of anxiety may be more impactful. Will monitor for now.              - anxiety: hydroxyzine 10 mg PRN; Ativan held due  to hypotension             - Added Trazadone 50 mg QHS PRN for insomnia             - Neuropsych consult placed given cognitive deficits, anxiety, and depression s/p rad/chemotherapy for brain mets - on schedule for Monday   5. Neuropsych/cognition: This patient is capable of making decisions on his own behalf.           - SLP consult placed    6. Skin/Wound Care: routine skin care checks   7. Malnutrition (endorses 70+ lb unintentional weight loss in <1 year): routine Is and Os and follow-up chemistries         - Prealbumin low, discussed with patient, encouraged choosing high protein foods.             - Difficulty with POs due to nausea; PRN compazine usually helpful, less so today  -9/24 intake appears to be a bit better this past week although still variable 8: Large cell neuroendocrine carcinoma with brain, bone metastasis:             -clinical trial med held during admission   9: Orthostatic hypotension: continue midodrine 10 mg TID, advised drinking water in the morning before getting up for therapies             -placed order for smaller abdominal binder.              - abdominal binder and TED only during the daytime - changed to when up to bedside or OOB due to discomfort. Reinforced not needed in bed 9/17.              - +/- Mestinon per cardiology; orthostatics improving, hold off for now             - no beta blockers  9/23 +orthostatics this am  -need to keep pushing po fluids as well as other measures above. Pt denied any sx this morning. Consider florinef 10: CAD s/p CABG 2017: Stable.  Currently off beta-blocker   11: Paroxysmal atrial fibrillation: continue Xarelto             -no BB due to significant orthostatic hypotension   12: Anemia, chronic: follow-up CBC   13.  Vitamin D deficiency: level 17.42: start ergocalciferol 50,000U once per week.    14: Code status: DNR/DNI - following with palliative   15. Confusion: d/c trazodone and atarax, UA and UC ordered and returned negative, discussed with patient. Likely 2/2 to medicines and has imroved with discontinuation  16. Constipation: start colace 100mg  TID. Add miralax daily.   +bm 9/22 17. Nausea: continue compazine and zofran prn and scopolamine patch.       LOS: 10 days A FACE TO FACE EVALUATION WAS PERFORMED  Meredith Staggers 05/23/2022, 8:08 AM

## 2022-05-23 NOTE — Progress Notes (Signed)
Physical Therapy Session Note  Patient Details  Name: [name] MRN: [id number] Date of Birth: 18-Apr-1956  Today's Date: 05/23/2022 PT Individual Time: 2633-3545 PT Individual Time Calculation (min): 45 min   Short Term Goals: Week 1:  PT Short Term Goal 1 (Week 1): STG=LTG secondary to ELOS  Skilled Therapeutic Interventions/Progress Updates:    Pt received seated in bed asleep, arousable and agreeable to PT session. Pt reports minor pain at the moment, ongoing cramping in muscles that medical team is aware of. Pt reports some apprehension regarding upcoming d/c due to ongoing OH and fear of falling/passing out at home. Encouraged pt to monitor symptoms and to continue to wear thigh-high TEDs and abdominal binder for all standing activities. Pt already wearing TEDs, needs some assist to don abdominal binder while seated EOB. Pt performs bed mobility and all transfers with RW at Independent to mod I level throughout session. Ambulatory transfer to toilet with RW at mod I level. Ambulation to/from therapy gym with RW at mod I level. Ambulation across uneven surface with railing at Supervision level for safety. Ambulation through obstacle course weaving through cones, static stance on airex x 30 sec, and gait across uneven mat with no AD and CGA to min A for balance. Pt with LOB while weaving through cones, utilizes cross-over stepping to regain balance with min A needed to recover to prevent fall. Static standing balance performing alt L/R cone taps with min A for balance. Pt tends to lose balance posteriorly, worked on use of ankle strategy to recover balance and prevent fall. Seated BP 107/82 at end of session. Pt returned to bed and left seated in bed with needs in reach at end of session.  Therapy Documentation Precautions:  Precautions Precautions: Fall Precaution Comments: orthostatics, monitor HR Other Brace: abdominal binder and TEDs Restrictions Weight Bearing Restrictions:  No      Therapy/Group: Individual Therapy   Excell Seltzer, PT, DPT, CSRS 05/23/2022, 12:05 PM

## 2022-05-24 ENCOUNTER — Other Ambulatory Visit (HOSPITAL_COMMUNITY): Payer: Self-pay

## 2022-05-24 DIAGNOSIS — I951 Orthostatic hypotension: Secondary | ICD-10-CM | POA: Diagnosis not present

## 2022-05-24 LAB — CBC
HCT: 29.3 % — ABNORMAL LOW (ref 39.0–52.0)
Hemoglobin: 9.1 g/dL — ABNORMAL LOW (ref 13.0–17.0)
MCH: 29.4 pg (ref 26.0–34.0)
MCHC: 31.1 g/dL (ref 30.0–36.0)
MCV: 94.5 fL (ref 80.0–100.0)
Platelets: 199 10*3/uL (ref 150–400)
RBC: 3.1 MIL/uL — ABNORMAL LOW (ref 4.22–5.81)
RDW: 13.8 % (ref 11.5–15.5)
WBC: 4.9 10*3/uL (ref 4.0–10.5)
nRBC: 0 % (ref 0.0–0.2)

## 2022-05-24 LAB — BASIC METABOLIC PANEL
Anion gap: 6 (ref 5–15)
BUN: 21 mg/dL (ref 8–23)
CO2: 28 mmol/L (ref 22–32)
Calcium: 9.5 mg/dL (ref 8.9–10.3)
Chloride: 105 mmol/L (ref 98–111)
Creatinine, Ser: 1.13 mg/dL (ref 0.61–1.24)
GFR, Estimated: >60 mL/min (ref >60)
Glucose, Bld: 105 mg/dL — ABNORMAL HIGH (ref 70–99)
Potassium: 4 mmol/L (ref 3.5–5.1)
Sodium: 139 mmol/L (ref 135–145)

## 2022-05-24 MED ORDER — HEPARIN SOD (PORK) LOCK FLUSH 100 UNIT/ML IV SOLN
500.0000 [IU] | INTRAVENOUS | Status: AC | PRN
  Administered 2022-05-24: 500 [IU]
  Filled 2022-05-24: qty 5

## 2022-05-24 MED ORDER — MELOXICAM 15 MG PO TABS: 15.0000 mg | ORAL_TABLET | Freq: Every day | ORAL | 0 refills | Status: DC

## 2022-05-24 MED ORDER — ESCITALOPRAM OXALATE 5 MG PO TABS: 5.0000 mg | ORAL_TABLET | Freq: Every day | ORAL | 0 refills | Status: DC

## 2022-05-24 MED ORDER — DICLOFENAC SODIUM 1 % EX GEL: 2.0000 g | Freq: Four times a day (QID) | CUTANEOUS | 0 refills | Status: DC

## 2022-05-24 MED ORDER — VITAMIN D (ERGOCALCIFEROL) 1.25 MG (50000 UNIT) PO CAPS: 50000.0000 [IU] | ORAL_CAPSULE | ORAL | 0 refills | Status: DC

## 2022-05-24 MED ORDER — ACETAMINOPHEN 325 MG PO TABS: 325.0000 mg | ORAL_TABLET | ORAL | Status: AC | PRN

## 2022-05-24 MED ORDER — POLYETHYLENE GLYCOL 3350 17 G PO PACK: 17.0000 g | PACK | Freq: Every day | ORAL | 0 refills | Status: AC

## 2022-05-24 MED ORDER — DOCUSATE SODIUM 100 MG PO CAPS: 100.0000 mg | ORAL_CAPSULE | Freq: Three times a day (TID) | ORAL | 0 refills | Status: AC

## 2022-05-24 MED ORDER — MAGNESIUM GLUCONATE 500 MG PO TABS: 250.0000 mg | ORAL_TABLET | Freq: Every day | ORAL | 0 refills | Status: AC

## 2022-05-24 MED ORDER — METHOCARBAMOL 500 MG PO TABS: 500.0000 mg | ORAL_TABLET | Freq: Four times a day (QID) | ORAL | 0 refills | Status: DC | PRN

## 2022-05-24 MED ORDER — SCOPOLAMINE 1 MG/3DAYS TD PT72: 1.0000 | MEDICATED_PATCH | TRANSDERMAL | 0 refills | Status: DC

## 2022-05-24 MED ORDER — GABAPENTIN 100 MG PO CAPS: 100.0000 mg | ORAL_CAPSULE | Freq: Three times a day (TID) | ORAL | 0 refills | Status: DC

## 2022-05-24 MED ORDER — MIDODRINE HCL 10 MG PO TABS: 10.0000 mg | ORAL_TABLET | Freq: Three times a day (TID) | ORAL | 0 refills | Status: DC

## 2022-05-24 MED ORDER — BISACODYL 5 MG PO TBEC: 5.0000 mg | DELAYED_RELEASE_TABLET | Freq: Every day | ORAL | 0 refills | Status: DC | PRN

## 2022-05-24 MED ORDER — NALOXONE HCL 4 MG/0.1ML NA LIQD
NASAL | 1 refills | Status: AC
  Filled 2022-05-24: qty 2, 1d supply, fill #0

## 2022-05-24 NOTE — Progress Notes (Signed)
Patient being DC home this afternoon. Patient has no questions or concerns at this time regarding being DC home. Patient requested PRN pain medications prior to DC. Wife at bedside packing patients belongings.

## 2022-05-24 NOTE — Progress Notes (Signed)
Inpatient Rehabilitation Care Coordinator Discharge Note   Patient Details  Name: [name] MRN: [id number] Date of Birth: 08-Feb-1956   Discharge location: Home  Length of Stay: 11 Days  Discharge activity level: Supervision  Home/community participation: [place], Daughter and family/Friends  Patient response GE:ZMOQHU Literacy - How often do you need to have someone help you when you read instructions, pamphlets, or other written material from your doctor or pharmacy?: Never  Patient response TM:LYYTKP Isolation - How often do you feel lonely or isolated from those around you?: Never  Services provided included: MD, RD, PT, OT, SLP, RN, CM, TR, Pharmacy, Neuropsych, SW  Financial Services:  Financial Services Utilized: Medicare    Choices offered to/list presented to: patient  Follow-up services arranged:  Outpatient    Outpatient Servicies: Neuro Rehab      Patient response to transportation need: Is the patient able to respond to transportation needs?: Yes In the past 12 months, has lack of transportation kept you from medical appointments or from getting medications?: No In the past 12 months, has lack of transportation kept you from meetings, work, or from getting things needed for daily living?: No    Comments (or additional information):  Patient/Family verbalized understanding of follow-up arrangements:  Yes  Individual responsible for coordination of the follow-up plan: patient/spouse  Confirmed correct DME delivered: Dyanne Iha 05/24/2022    Dyanne Iha

## 2022-05-24 NOTE — Progress Notes (Signed)
Inpatient Rehabilitation Discharge Medication Review by a Pharmacist  A complete drug regimen review was completed for this patient to identify any potential clinically significant medication issues.  High Risk Drug Classes Is patient taking? Indication by Medication  Antipsychotic Yes Compazine-N/V  Anticoagulant Yes Xarelto-afib  Antibiotic No   Opioid Yes Norco, morphine-pain  Antiplatelet No   Hypoglycemics/insulin No   Vasoactive Medication Yes Midodrine: orthostatic hypotension  Chemotherapy No   Other Yes Lexapro- depression/anxiety Gabapentin-neuropathy Mobic-pain Scopolamine- nausea Vit. D-deficiency Robaxin - muscle spasms     Type of Medication Issue Identified Description of Issue Recommendation(s)  Drug Interaction(s) (clinically significant)     Duplicate Therapy     Allergy     No Medication Administration End Date     Incorrect Dose     Additional Drug Therapy Needed     Significant med changes from prior encounter (inform family/care partners about these prior to discharge). Held cymbalta, ativan, metoprolol due to hypotension   Other  PTA meds held during hospital admission: Metoprolol, Ativan, Cymbalta, Cemilipimab, Alirocumab     Clinically significant medication issues were identified that warrant physician communication and completion of prescribed/recommended actions by midnight of the next day:  No  Name of provider notified for urgent issues identified:   Provider Method of Notification:     Pharmacist comments:   Time spent performing this drug regimen review (minutes):  20 minutes   Sandford Craze, PharmD. [facility] Acute Care PGY-1  05/23/2022 2:56 PM

## 2022-05-24 NOTE — Progress Notes (Signed)
PROGRESS NOTE   Subjective/Complaints: Feels diffuse joint pain- has been trying to eat a lot more here, including cookies and graham crackers which he does not usually eat Has had no emesis in 30 hours  ROS: Patient denies fever, rash, sore throat, blurred vision, dizziness, nausea, vomiting, diarrhea, cough, shortness of breath or chest pain, headache, or mood change. Denies emesis   Objective:   No results found. Recent Labs    05/24/22 0340  WBC 4.9  HGB 9.1*  HCT 29.3*  PLT 199    Recent Labs    05/24/22 0340  NA 139  K 4.0  CL 105  CO2 28  GLUCOSE 105*  BUN 21  CREATININE 1.13  CALCIUM 9.5     Intake/Output Summary (Last 24 hours) at 05/24/2022 1004 Last data filed at 05/24/2022 8469 Gross per 24 hour  Intake 600 ml  Output --  Net 600 ml        Physical Exam:  Constitutional: No distress . Vital signs reviewed. HEENT: NCAT, EOMI, oral membranes moist Neck: supple Cardiovascular: RRR without murmur. No JVD    Respiratory/Chest: CTA Bilaterally without wheezes or rales. Normal effort    GI/Abdomen: BS +, non-tender, non-distended Ext: no clubbing, cyanosis, or edema Psych: pleasant and cooperative  GU: Not examined.  Skin: C/D/I. No apparent lesions. MSK:      both feet with edema, pain, discoloration. Neuro:     Strength:                RUE and RLE 4- to 4/5. LUE and LLE 5/5.  Cognition: AAO to person, place, time and event.   Language: Fluent, No substitutions or neoglisms. No dysarthria. Names 3/3 objects correctly. Repetition intact Memory: fair functional memory   Insight: Good insight into current condition.  Sensation: To light touch intact in BL UEs and LEs with exception of right foot plantar, left 5th digit Reflexes: 2+ in BL UE and LEs. Negative Hoffman's and babinski signs bilaterally.  CN: 2-12 grossly intact.  Coordination: + BL UE intention tremors-more pronounced. No  ataxia on FTN, HTS bilaterally.      Assessment/Plan: 1. Functional deficits which require 3+ hours per day of interdisciplinary therapy in a comprehensive inpatient rehab setting. Physiatrist is providing close team supervision and 24 hour management of active medical problems listed below. Physiatrist and rehab team continue to assess barriers to discharge/monitor patient progress toward functional and medical goals  Care Tool:  Bathing    Body parts bathed by patient: Right arm, Left arm, Chest, Abdomen, Front perineal area, Buttocks, Right upper leg, Left upper leg, Face, Right lower leg, Left lower leg   Body parts bathed by helper: Right lower leg, Left lower leg     Bathing assist Assist Level: Independent with assistive device     Upper Body Dressing/Undressing Upper body dressing   What is the patient wearing?: Pull over shirt, Orthosis    Upper body assist Assist Level: Set up assist    Lower Body Dressing/Undressing Lower body dressing      What is the patient wearing?: Pants     Lower body assist Assist for lower body dressing: Supervision/Verbal cueing  Toileting Toileting    Toileting assist Assist for toileting: Supervision/Verbal cueing     Transfers Chair/bed transfer  Transfers assist     Chair/bed transfer assist level: Independent with assistive device Chair/bed transfer assistive device: Programmer, multimedia   Ambulation assist      Assist level: Independent with assistive device Assistive device: Walker-rolling Max distance: 162ft   Walk 10 feet activity   Assist     Assist level: Independent with assistive device Assistive device: Walker-rolling   Walk 50 feet activity   Assist    Assist level: Independent with assistive device Assistive device: Walker-rolling    Walk 150 feet activity   Assist    Assist level: Independent with assistive device Assistive device: Walker-rolling    Walk 10  feet on uneven surface  activity   Assist     Assist level: Supervision/Verbal cueing Assistive device: Other (comment) (with railing)   Wheelchair     Assist Is the patient using a wheelchair?: No             Wheelchair 50 feet with 2 turns activity    Assist            Wheelchair 150 feet activity     Assist          Blood pressure 99/67, pulse (!) 112, temperature 98.7 F (37.1 C), temperature source Oral, resp. rate 16, height 5' 9.02" (1.753 m), weight 59.5 kg, SpO2 98 %.  Medical Problem List and Plan: 1. 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  Antithrombotics: -DVT/anticoagulation:  Pharmaceutical: Xarelto             -antiplatelet therapy: none   3. 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 Tylenol, Robaxin             - MS Contin 60 mg BID - home dose             - Dilaudid 4 mg q 4 hours as needed -> increased to home dose 4/8 mg Q4H PRN  D/c IV dilaudid since patient not requiring.             - Pain meds managed by cancer center pain management; will need to inform them of any changes at discharge   -9/24 shooting LE/foot pain. ?neuropathic--continue gabapentin 100mg  tid    -ordered Kpad for muscle tightness 4. Anxiety: d/c hydroxyzine due to confusion             -antipsychotic agents: n/a  -discussed starting SSRI and he is agreeable             -depression: Cymbalta held due to hypotension; once improved, patient  may benefit from change to Mirtazepine for depression, sleep, and appetite. Do appreciate symptoms of anxiety may be more impactful.  Will monitor for now.              - anxiety: hydroxyzine 10 mg PRN; Ativan held due to hypotension             - Added Trazadone 50 mg QHS PRN for insomnia             - Neuropsych consult placed given cognitive deficits, anxiety, and depression s/p rad/chemotherapy for brain mets - on schedule for Monday   5. Neuropsych/cognition: This patient is capable of making decisions on his own behalf.           - SLP consult placed    6. Skin/Wound Care: routine skin care checks   7. Malnutrition (endorses 70+ lb unintentional weight loss in <1 year): routine Is and Os and follow-up chemistries         - Prealbumin low, discussed with patient, encouraged choosing high protein foods.             - Difficulty with POs due to nausea; PRN compazine usually helpful, less so today  -9/24 intake appears to be a bit better this past week although still variable 8: Large cell neuroendocrine carcinoma with brain, bone metastasis:             -clinical trial med held during admission   9: Orthostatic hypotension: continue midodrine 10 mg TID, advised drinking water in the morning before getting up for therapies             -placed order for smaller abdominal binder.              - abdominal binder and TED only during the daytime - changed to when up to bedside or OOB due to discomfort. Reinforced not needed in bed 9/17.              - +/- Mestinon per cardiology; orthostatics improving, hold off for now             - no beta blockers  9/23 +orthostatics this am  -need to keep pushing po fluids as well as other measures above. Pt denied any sx this morning. Consider florinef 10: CAD s/p CABG 2017: Stable.  Currently off beta-blocker   11: Paroxysmal atrial fibrillation: continue Xarelto             -no BB due to significant orthostatic hypotension   12: Anemia, chronic:  recommend repeat CBC with PCP within a week given decrease in Hgb to 9.1, is usually around 10   13. Vitamin D deficiency: level 17.42: start ergocalciferol 50,000U once per week.    14: Code status: DNR/DNI - following with palliative   15. Confusion: d/c trazodone and atarax, UA and UC ordered and returned negative, discussed with patient. Likely 2/2 to medicines and has imroved with discontinuation  16. Constipation: start colace 100mg  TID. Add miralax daily.   +bm 9/22 17. Nausea: continue compazine and zofran prn and scopolamine patch. Discussed that constipation could contribute to nausea 18. Tachycardia: first time today, will get EKG.  19. Diffuse aches and pains: discussed could be secondary to increased sugar/gluten consumption, advised decreasing these in diet to see if symptoms improve   >30 minutes spent in discharge of patient including review of medications and follow-up appointments, physical examination, and in answering all patient's questions       LOS: 11 days A FACE TO FACE EVALUATION WAS [place] 05/24/2022, 10:04 AM

## 2022-05-25 ENCOUNTER — Telehealth: Payer: Self-pay

## 2022-05-25 ENCOUNTER — Telehealth: Payer: Self-pay | Admitting: *Deleted

## 2022-05-25 ENCOUNTER — Encounter: Payer: Self-pay | Admitting: *Deleted

## 2022-05-25 ENCOUNTER — Other Ambulatory Visit (HOSPITAL_COMMUNITY): Payer: Self-pay

## 2022-05-25 ENCOUNTER — Other Ambulatory Visit: Payer: Self-pay | Admitting: Nurse Practitioner

## 2022-05-25 DIAGNOSIS — G893 Neoplasm related pain (acute) (chronic): Secondary | ICD-10-CM

## 2022-05-25 DIAGNOSIS — Z515 Encounter for palliative care: Secondary | ICD-10-CM

## 2022-05-25 DIAGNOSIS — C7A1 Malignant poorly differentiated neuroendocrine tumors: Secondary | ICD-10-CM

## 2022-05-25 MED ORDER — HYDROMORPHONE HCL 8 MG PO TABS
8.0000 mg | ORAL_TABLET | Freq: Four times a day (QID) | ORAL | 0 refills | Status: DC | PRN
  Filled 2022-05-25: qty 60, 15d supply, fill #0

## 2022-05-25 NOTE — Telephone Encounter (Signed)
Pt called for refills of pain medications. Dilaudid sent in, however is it not time for his MS Contin, pt educated and verbalized understanding.

## 2022-05-25 NOTE — Patient Instructions (Signed)
Visit Information  Thank you for taking time to visit with me today. Please don't hesitate to contact me if I can be of assistance to you.   Following are the goals we discussed today:   Goals Addressed               This Visit's Progress     COMPLETED: No needs (pt-stated)        Care Coordination Interventions: Advised patient to schedule AWV for this year with the primary provider Provided education to patient and/or caregiver about advanced directives Reviewed medications with patient and discussed adherence to all medications and educated accordingly Reviewed scheduled/upcoming provider appointments including pending appointments and verified sufficient transportation. Screening for signs and symptoms of depression related to chronic disease state  Assessed social determinant of health barriers         Please call the care guide team at [phone] if you need to cancel or reschedule your appointment.   If you are experiencing a Mental Health or [place] or need someone to talk to, please call the Suicide and Crisis Lifeline: 988  Patient verbalizes understanding of instructions and care plan provided today and agrees to view in [place]. Active MyChart status and patient understanding of how to access instructions and care plan via MyChart confirmed with patient.     No further follow up required: No needs  Raina Mina, RN Care Management Coordinator [place] [facility] [phone]

## 2022-05-25 NOTE — Patient Outreach (Signed)
  Care Coordination   Initial Visit Note   05/25/2022 Name: [name] MRN: [id number] DOB: 06-15-1956  [name] is a 66 y.o. year old male who sees [name], MD for primary care. I spoke with  [name] by phone today.  What matters to the patients health and wellness today?  No needs    Goals Addressed               This Visit's Progress     COMPLETED: No needs (pt-stated)        Care Coordination Interventions: Advised patient to schedule AWV for this year with the primary provider Provided education to patient and/or caregiver about advanced directives Reviewed medications with patient and discussed adherence to all medications and educated accordingly Reviewed scheduled/upcoming provider appointments including pending appointments and verified sufficient transportation. Screening for signs and symptoms of depression related to chronic disease state  Assessed social determinant of health barriers         SDOH assessments and interventions completed:  Yes  SDOH Interventions Today    Flowsheet Row Most Recent Value  SDOH Interventions   Food Insecurity Interventions Intervention Not Indicated  Housing Interventions Intervention Not Indicated  Transportation Interventions Intervention Not Indicated  Utilities Interventions Intervention Not Indicated        Care Coordination Interventions Activated:  Yes  Care Coordination Interventions:  Yes, provided   Follow up plan: No further intervention required.   Encounter Outcome:  Pt. Visit Completed   Anthony Mina, RN Care Management Coordinator [place] Office [phone]

## 2022-05-26 ENCOUNTER — Other Ambulatory Visit: Payer: Self-pay | Admitting: Radiation Therapy

## 2022-05-29 ENCOUNTER — Other Ambulatory Visit (HOSPITAL_COMMUNITY): Payer: Self-pay

## 2022-05-31 ENCOUNTER — Inpatient Hospital Stay: Payer: Medicare Other | Attending: Physician Assistant

## 2022-06-02 NOTE — Addendum Note (Signed)
Addended by: Pincus Large on: 06/02/2022 10:41 AM   Modules accepted: Orders

## 2022-06-07 ENCOUNTER — Encounter: Payer: Self-pay | Admitting: Physical Medicine and Rehabilitation

## 2022-06-07 ENCOUNTER — Encounter
Payer: Medicare Other | Attending: Physical Medicine and Rehabilitation | Admitting: Physical Medicine and Rehabilitation

## 2022-06-07 VITALS — BP 130/87 | Ht 69.0 in | Wt 136.0 lb

## 2022-06-07 DIAGNOSIS — R059 Cough, unspecified: Secondary | ICD-10-CM | POA: Diagnosis present

## 2022-06-07 DIAGNOSIS — R110 Nausea: Secondary | ICD-10-CM | POA: Insufficient documentation

## 2022-06-07 DIAGNOSIS — C7951 Secondary malignant neoplasm of bone: Secondary | ICD-10-CM | POA: Diagnosis present

## 2022-06-07 DIAGNOSIS — R251 Tremor, unspecified: Secondary | ICD-10-CM | POA: Diagnosis not present

## 2022-06-07 DIAGNOSIS — R630 Anorexia: Secondary | ICD-10-CM | POA: Diagnosis present

## 2022-06-07 DIAGNOSIS — I951 Orthostatic hypotension: Secondary | ICD-10-CM

## 2022-06-07 DIAGNOSIS — G893 Neoplasm related pain (acute) (chronic): Secondary | ICD-10-CM | POA: Insufficient documentation

## 2022-06-07 MED ORDER — BENZONATATE 100 MG PO CAPS: 100.0000 mg | ORAL_CAPSULE | Freq: Three times a day (TID) | ORAL | 2 refills | Status: DC | PRN

## 2022-06-07 MED ORDER — METHOCARBAMOL 500 MG PO TABS: 500.0000 mg | ORAL_TABLET | Freq: Four times a day (QID) | ORAL | 3 refills | Status: DC | PRN

## 2022-06-07 NOTE — Progress Notes (Signed)
Subjective:    Patient ID: [name], male    DOB: 1956/03/29, 66 y.o.   MRN: [id number]     Pain Inventory Average Pain 6 Pain Right Now 7 My pain is sharp, burning, stabbing, and aching  In the last 24 hours, has pain interfered with the following? General activity 4 Relation with others 4 Enjoyment of life 6  What TIME of day is your pain at its worst? morning  and evening Sleep (in general) Fair   ability to climb steps?  yes do you drive?  no Do you have any goals in this area?  yes  retired  weakness confusion anxiety      Family History  Problem Relation Age of Onset   Lung cancer Mother    Heart Problems Father    Heart attack Father 50   Stroke Father    Heart failure Father    Heart attack Maternal Grandmother    Stroke Maternal Grandmother    Heart attack Paternal Uncle    Hypertension Brother    Autoimmune disease Neg Hx    Social History   Socioeconomic History   Marital status: Married    Spouse name: [name]   Number of children: 3   Years of education: College   Highest education level: Not on file  Occupational History   Occupation: Scientist, research (physical sciences): SELF-EMPLOYED  Tobacco Use   Smoking status: Former    Packs/day: 0.75    Years: 44.00    Total pack years: 33.00    Types: Cigarettes    Quit date: 11/28/2015    Years since quitting: 6.5   Smokeless tobacco: Never  Vaping Use   Vaping Use: Never used  Substance and Sexual Activity   Alcohol use: Not Currently   Drug use: No    Comment: 06/21/2017 "nothing since the 1980s"   Sexual activity: Yes  Other Topics Concern   Not on file  Social History Narrative   Patient lives at home with his spouse.   Caffeine Use: yes   Social Determinants of Health   Financial Resource Strain: Not on file  Food Insecurity: No Food Insecurity (05/25/2022)   Hunger Vital Sign    Worried About Running Out of Food in the Last Year: Never true    Ran Out of Food in the Last  Year: Never true  Transportation Needs: No Transportation Needs (05/25/2022)   PRAPARE - Hydrologist (Medical): No    Lack of Transportation (Non-Medical): No  Physical Activity: Not on file  Stress: Not on file  Social Connections: Not on file   Past Surgical History:  Procedure Laterality Date   25 GAUGE PARS PLANA VITRECTOMY WITH 20 GAUGE MVR PORT  12/31/2020   Procedure: 25 GAUGE PARS PLANA VITRECTOMY WITH 20 GAUGE MVR PORT;  Surgeon: [name], MD;  [facility]: [facility];  Service: Thoracic;;   ANTERIOR CERVICAL DECOMP/DISCECTOMY FUSION N/A 10/17/2018   Procedure: Anterior Cervical Decompression Fusion - Cervical seven -Thoracic one;  Surgeon: Consuella Lose, MD;  Location: [place];  Service: Neurosurgery;  Laterality: N/A;   BASAL CELL CARCINOMA EXCISION     "forehead   BIOPSY  07/20/2019   Procedure: BIOPSY;  Surgeon: Carol Ada, MD;  Location: [facility];  Service: Endoscopy;;   BIOPSY OF MEDIASTINAL MASS Right 07/29/2021   Procedure: RIGHT CHEST WALL MASS BIOPSY;  Surgeon: [name], MD;  Location: [place];  Service: Thoracic;  Laterality:  Right;   BRONCHIAL BIOPSY  12/24/2020   Procedure: BRONCHIAL BIOPSIES;  Surgeon: Garner Nash, DO;  Location: [facility] [facility];  Service: Pulmonary;;   BRONCHIAL BRUSHINGS  12/24/2020   Procedure: BRONCHIAL BRUSHINGS;  Surgeon: Garner Nash, DO;  Location: [facility] [facility];  Service: Pulmonary;;   BRONCHIAL NEEDLE ASPIRATION BIOPSY  12/24/2020   Procedure: BRONCHIAL NEEDLE ASPIRATION BIOPSIES;  Surgeon: Garner Nash, DO;  Location: [place];  Service: Pulmonary;;   CARDIAC CATHETERIZATION N/A 11/28/2015   Procedure: Left Heart Cath and Coronary Angiography;  Surgeon: [name], MD;  Location: [place] [facility];  Service: Cardiovascular;  Laterality: N/A;   CARDIAC CATHETERIZATION N/A 11/28/2015   Procedure: Coronary Balloon Angioplasty;  Surgeon: [name], MD;  Location:  [place] [facility];  Service: Cardiovascular;  Laterality: N/A;  ostial LAD   CARDIAC CATHETERIZATION N/A 11/28/2015   Procedure: Coronary/Graft Angiography;  Surgeon: [name], MD;  Location: [place] [facility];  Service: Cardiovascular;  Laterality: N/A;  coronaries only    CARDIAC CATHETERIZATION N/A 04/21/2016   Procedure: Left Heart Cath and Coronary Angiography;  Surgeon: Wellington Hampshire, MD;  Location: [place] [facility];  Service: Cardiovascular;  Laterality: N/A;   CARDIAC CATHETERIZATION N/A 06/14/2016   Procedure: Left Heart Cath and Cors/Grafts Angiography;  Surgeon: Lorretta Harp, MD;  Location: [place] [facility];  Service: Cardiovascular;  Laterality: N/A;   CARDIAC CATHETERIZATION N/A 09/08/2016   Procedure: Left Heart Cath and Cors/Grafts Angiography;  Surgeon: Wellington Hampshire, MD;  Location: [place] [facility];  Service: Cardiovascular;  Laterality: N/A;   CARDIAC CATHETERIZATION     CORONARY ARTERY BYPASS GRAFT N/A 11/28/2015   Procedure: CORONARY ARTERY BYPASS GRAFTING (CABG) TIMES FIVE USING LEFT INTERNAL MAMMARY ARTERY AND RIGHT GREATER SAPHENOUS,VIEN HARVEATED BY ENDOVIEN, INTRAOPPRATIVE TEE;  Surgeon: Gaye Pollack, MD;  Location: [place];  Service: Open Heart Surgery;  Laterality: N/A;   CORONARY STENT INTERVENTION N/A 10/05/2017   Procedure: CORONARY STENT INTERVENTION;  Surgeon: [name], MD;  Location: [place] [facility];  Service: Cardiovascular;  Laterality: N/A;   ESOPHAGOGASTRODUODENOSCOPY (EGD) WITH PROPOFOL N/A 07/20/2019   Procedure: ESOPHAGOGASTRODUODENOSCOPY (EGD) WITH PROPOFOL;  Surgeon: Carol Ada, MD;  [facility];  Service: Endoscopy;  Laterality: N/A;   ESOPHAGOGASTRODUODENOSCOPY (EGD) WITH PROPOFOL N/A 01/30/2021   Procedure: ESOPHAGOGASTRODUODENOSCOPY (EGD) WITH PROPOFOL;  Surgeon: Carol Ada, MD;  [facility];  Service: Endoscopy;  Laterality: N/A;   FIDUCIAL MARKER PLACEMENT  12/24/2020   Procedure:  FIDUCIAL DYE MARKER PLACEMENT;  Surgeon: Garner Nash, DO;  Location: [place] [facility];  Service: Pulmonary;;   HUMERUS SURGERY Right 1969   "tumor inside bone; filled it w/bone chips"   INTERCOSTAL NERVE BLOCK Right 12/24/2020   Procedure: INTERCOSTAL NERVE BLOCK;  Surgeon: [name], MD;  Location: [place];  Service: Thoracic;  Laterality: Right;   IR IMAGING GUIDED PORT INSERTION  02/06/2021   LEFT HEART CATH AND CORS/GRAFTS ANGIOGRAPHY N/A 03/11/2017   Procedure: Left Heart Cath and Cors/Grafts Angiography;  Surgeon: Leonie Man, MD;  Location: [place] [facility];  Service: Cardiovascular;  Laterality: N/A;   LEFT HEART CATH AND CORS/GRAFTS ANGIOGRAPHY N/A 10/05/2017   Procedure: LEFT HEART CATH AND CORS/GRAFTS ANGIOGRAPHY;  Surgeon: [name], MD;  Location: [place] [facility];  Service: Cardiovascular;  Laterality: N/A;   LEFT HEART CATH AND CORS/GRAFTS ANGIOGRAPHY N/A 04/11/2019   Procedure: LEFT HEART CATH AND CORS/GRAFTS ANGIOGRAPHY;  Surgeon: Irish Lack,  Charlann Lange, MD;  Location: [place] [facility];  Service: Cardiovascular;  Laterality: N/A;   NODE DISSECTION Right 12/24/2020   Procedure: NODE DISSECTION;  Surgeon: [name], MD;  Location: [place];  Service: Thoracic;  Laterality: Right;   PERIPHERAL VASCULAR CATHETERIZATION N/A 06/14/2016   Procedure: Lower Extremity Angiography;  Surgeon: Lorretta Harp, MD;  Location: [place] [facility];  Service: Cardiovascular;  Laterality: N/A;   VIDEO BRONCHOSCOPY WITH ENDOBRONCHIAL NAVIGATION Right 12/24/2020   Procedure: VIDEO BRONCHOSCOPY WITH ENDOBRONCHIAL NAVIGATION;  Surgeon: Garner Nash, DO;  Location: [place];  Service: Pulmonary;  Laterality: Right;   VIDEO BRONCHOSCOPY WITH ENDOBRONCHIAL ULTRASOUND N/A 12/24/2020   Procedure: VIDEO BRONCHOSCOPY WITH ENDOBRONCHIAL ULTRASOUND;  Surgeon: Garner Nash, DO;  Location: [place];  Service: Pulmonary;  Laterality: N/A;   VIDEO BRONCHOSCOPY WITH  INSERTION OF INTERBRONCHIAL VALVE (IBV) N/A 12/31/2020   Procedure: VIDEO BRONCHOSCOPY WITH INSERTION OF INTERBRONCHIAL VALVE (IBV).VALVE IN CARTRIDGE 74mm,9mm. CHEST TUBE PLACEMENT.;  Surgeon: [name], MD;  [facility];  Service: Thoracic;  Laterality: N/A;   VIDEO BRONCHOSCOPY WITH INSERTION OF INTERBRONCHIAL VALVE (IBV) N/A 07/29/2021   Procedure: VIDEO BRONCHOSCOPY WITH REMOVAL OF INTERBRONCHIAL VALVE (IBV);  Surgeon: [name], MD;  Location[facility];  Service: Thoracic;  Laterality: N/A;   Past Medical History:  Diagnosis Date   Anemia    Anxiety    Arthritis    Basal cell carcinoma (BCC) of forehead    CAD (coronary artery disease)    a. 10/2015 ant STEMI >> LHC with 3 v CAD; oLAD tx with POBA >> emergent CABG. b. Multiple evals since that time, early graft failure of SVG-RCA by cath 03/2016. c. 2/19 PCI/DES x1 to pRCA, normal EF.   Carotid artery disease ([place])    a. 40-59% BICA 02/2018.   Depression    Dyspnea    Ectopic atrial tachycardia    Esophageal reflux    eosinophil esophagitis   Family history of adverse reaction to anesthesia    "sister has PONV" (06/21/2017)   Former tobacco use    Gout    Hepatitis C    "treated and cured" (06/21/2017)   High cholesterol    History of blood transfusion    History of kidney stones    Hypertension    Ischemic cardiomyopathy    a. EF 25-30% at intraop TEE 4/17  //  b. Limited Echo 5/17 - EF 45-50%, mild ant HK. c. EF 55-65% by cath 09/2017.   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 HR dropping into 40s in 02/2016 -> BB reduced.   Stroke [facility]) 10/2016   "small one; sometimes my memory/cognitive issues" (06/21/2017)   Symptomatic hypotension    a. 02/2016 ER visit -> meds reduced.   Syncope    Wears dentures    Wears glasses    BP 130/87   Ht 5\' 9"  (1.753 m)   Wt 136 lb  (61.7 kg)   SpO2 96%   BMI 20.08 kg/m   Opioid Risk Score:   Fall Risk Score:  `1  Depression screen [facility] 2/9     06/07/2022    2:37 PM 05/25/2022    2:29 PM 02/09/2016    4:43 PM  Depression screen PHQ 2/9  Decreased Interest 2 0 2  Down, Depressed,  Hopeless 0 0 2  PHQ - 2 Score 2 0 4  Altered sleeping 2  2  Tired, decreased energy 2  0  Change in appetite 3  0  Feeling bad or failure about yourself  0  0  Trouble concentrating 2  0  Moving slowly or fidgety/restless 0  0  Suicidal thoughts 0  0  PHQ-9 Score 11  6  Difficult doing work/chores   Somewhat difficult    Review of Systems     Objective:   Physical Exam        Assessment & Plan:

## 2022-06-07 NOTE — Progress Notes (Addendum)
   Subjective:    Patient ID: [name], male    DOB: Apr 13, 1956, 66 y.o.   MRN: [id number]  HPI [name] is 67 year old man who presents with debility:  1) tremor -he is unable to type -has worsened at home.  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  2) apples- has a form of dietary fiber called pectin that accelerates stool movement and increases beneficial gut bacteria 3) pears- in addition to fiber, also high in fructose and sorbitol which have laxative effect 4) figs- contain an enzyme ficin which helps to speed colonic transit 5) kiwis- contain an enzyme actinidin that improves gut motility and reduces constipation 6) oranges- rich in pectin (like apples) 7) grapefruits- contain a flavanol naringenin which has a laxative effect 8) vegetables- rich in fiber and also great sources of folate, vitamin C, and K 9)  artichoke- high in inulin, prebiotic great for the microbiome 10) chicory- increases stool frequency and softness (can be added to coffee) 11) rhubarb- laxative effect 12) sweet potato- high fiber 13) beans, peas, and lentils- contain both soluble and insoluble fiber 14) chia seeds- improves intestinal health and gut flora 15) flaxseeds- laxative effect 16) whole grain rye bread- high in fiber 17) oat bran- high in soluble and insoluble fiber 18) kefir- softens stools -recommended to try at least one of these foods every day.  -drink 6-8 glasses of water per day -walk regularly, especially after meals.   Current impairments: impaired mobility and ADLs.   Patient will require home OT and PT; orders have been placed  The patient's medical and/or psychosocial problems require moderate decision-making during transitions in care from inpatient rehabilitation to home. This transitional care appointment included review of the patient's hospital discharge summary, review of the patient's hospital diagnostic tests and discussion of appropriate follow-up, education of the patient regarding their condition, re-establishment of necessary referrals. I will be reviewing patient's home and/or outpatient therapy notes as they progress through therapy and corresponding with therapists accordingly. I have encouraged compliance with current medication regimen (with adjustment to regimen as needed), follow-up with necessary providers, and the importance of following a healthy diet and exercise routine to maximize recovery, health, and quality of life.

## 2022-06-07 NOTE — Patient Instructions (Signed)
Constipation:  -Provided list of following foods that help with constipation and highlighted a few: 1) prunes- contain high amounts of fiber.  2) apples- has a form of dietary fiber called pectin that accelerates stool movement and increases beneficial gut bacteria 3) pears- in addition to fiber, also high in fructose and sorbitol which have laxative effect 4) figs- contain an enzyme ficin which helps to speed colonic transit 5) kiwis- contain an enzyme actinidin that improves gut motility and reduces constipation 6) oranges- rich in pectin (like apples) 7) grapefruits- contain a flavanol naringenin which has a laxative effect 8) vegetables- rich in fiber and also great sources of folate, vitamin C, and K 9) artichoke- high in inulin, prebiotic great for the microbiome 10) chicory- increases stool frequency and softness (can be added to coffee) 11) rhubarb- laxative effect 12) sweet potato- high fiber 13) beans, peas, and lentils- contain both soluble and insoluble fiber 14) chia seeds- improves intestinal health and gut flora 15) flaxseeds- laxative effect 16) whole grain rye bread- high in fiber 17) oat bran- high in soluble and insoluble fiber 18) kefir- softens stools -recommended to try at least one of these foods every day.  -drink 6-8 glasses of water per day -walk regularly, especially after meals.

## 2022-06-09 ENCOUNTER — Encounter: Payer: Self-pay | Admitting: Physician Assistant

## 2022-06-10 ENCOUNTER — Telehealth: Payer: Self-pay | Admitting: *Deleted

## 2022-06-10 NOTE — Progress Notes (Deleted)
Cardiology Office Note   Date:  06/10/2022   ID:  [name], DOB 26-Nov-1955, MRN [id number]  PCP:  [name], MD    No chief complaint on file.  CAD  Wt Readings from Last 3 Encounters:  06/07/22 136 lb (61.7 kg)  05/13/22 131 lb 2.8 oz (59.5 kg)  05/03/22 138 lb (62.6 kg)       History of Present Illness: [name] is a 66 y.o. male  with CAD, s/p anterior STEMI and emergent CABG in 2017.     He has had multiple episodes of chest pain since then and multiple caths in 2018 and a stress test not resulting in PCI.  He admits to having had some anxiety as well.  Medications have been difficult to tolerate for his anxiety at times.    In 2/19, he had a cath and PCI of the RCA due to narrowing of the graft to the distal RCA.    Seen 02/2018 for presyncope not felt to be cardiac in origin.    Patient was in the ER 06/09/2018 with palpitations and heart rate up to 120 but normal when he arrived.  Also had atypical chest pain anxiety was felt to be a component and he requested narcotics for relief. Event monitor 06/13/2018 normal sinus rhythm with occasional sinus bradycardia and sinus tachycardia.  No atrial fibrillation or pathologic arrhythmias.   Patient back in the ER 09/28/2018 for atypical chest pain that was reproducible.  2D echo normal LVEF 55 to 60% with no valvular problems.  CTA no evidence of aortic dissection, mild prominence of the ascending aorta at 3.9 x 3.9 cm.   In 09/2018, Patient had C7-T1 anterior cervical fusion done by [name] with [facility] neurosurgery and spine.     Since the RCA stent, he felt much better.  He developed more chest pain in 03/2019 which led to a cath: Mid LAD lesion is 55% stenosed. Ost LAD to Prox LAD lesion is 50% stenosed. LIMA to LAD is patent. Ost Ramus lesion is 90% stenosed. Ramus lesion is 75% stenosed. Ost Cx lesion is 80% stenosed. Prox Cx to Mid Cx lesion is 50% stenosed. SVG to OM is patent. Dist RCA  lesion is 50% stenosed. RPDA lesion is 40% stenosed. SVG to PDA is patent with proximal disease that is unchanged. Previously placed Ost RCA to Prox RCA drug eluting stent is widely patent. Graft to the diagonal was occluded. There was TIMI 3 flow in the small diagonal with proximal ectasia. The left ventricular systolic function is normal. LV end diastolic pressure is normal. LVEDP 11 mm Hg. The left ventricular ejection fraction is 50-55% by visual estimate. There is no aortic valve stenosis.   Continue medical therapy.     No new PCI was done.    He had COVID in 09/2020. He was not hospitalized.     Went to hospital on 10/24/20 for chest pain.  Negative troponins.  CT showed: "CT scan does not show evidence of pulmonary embolism but does show this large, 1.7 cm mass that is likely a primary bronchogenic carcinoma. "   Hospitalized in 03/2021: "SIRS-likely from dehydration and anemia versus infectious process.  COVID-19 PCR and blood cultures NGTD.  MRSA PCR screen negative.  SIRS resolved except for mild leukopenia.  Sepsis ruled out.   Near syncope/hypotension-likely from dehydration and anemia.  Orthostatic vitals negative.  Transfused 1 unit with appropriate response.  Resolved.     Symptomatic anemia/pancytopenia-likely  from chemotherapy.  He is on Xarelto and DAPT but denies melena or hematochezia.  Anemia panel without significant finding.  Transfused 1 unit with appropriate response.  H&H remained stable.   Has some brain mets, small spots.  Was diagnosed with brain mets.   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 Presenting with falls.    Patient underwent CT Head, Chest, abdomen and pelvis along with cervical spine and results reviewed with the patient.  Neurology consulted, recommended getting EEG, MRI Cervical and  thoracic spine.  Discussed the results of the MRI with the patient and family. EEG does not show any active seizures. Neurology signed off.  Patient was found to be in severe orthostatic hypotension. He was started on compression stockings up to thigh, abdominal binder, stopped the ativan and Cymbalta and started him on Midodrine with minimal improvement in the orthostatic bp measurements.  Cardiology consulted for severe orthostatic hypotension and recommendations given."  "Persistent hypotension on standing, with dizziness.  -Gentle hydration, compression stockings up to the thigh, abdominal binder . - midodrine added, discontinued the ativan and Cymbalta for now. Increased the dose of midodrine to 10 mg TID due to persistent orthostatic hypotension.  - suspect probably from autonomic dysfunction from chemotherapy? - appreciate cardiology input, recommend starting him on mestinon if he remains orthostatic.  - carotid duplex less than a year is wnl. "  Past Medical History:  Diagnosis Date   Anemia    Anxiety    Arthritis    Basal cell carcinoma (BCC) of forehead    CAD (coronary artery disease)    a. 10/2015 ant STEMI >> LHC with 3 v CAD; oLAD tx with POBA >> emergent CABG. b. Multiple evals since that time, early graft failure of SVG-RCA by cath 03/2016. c. 2/19 PCI/DES x1 to pRCA, normal EF.   Carotid artery disease ([place])    a. 40-59% BICA 02/2018.   Depression    Dyspnea    Ectopic atrial tachycardia    Esophageal reflux    eosinophil esophagitis   Family history of adverse reaction to anesthesia    "sister has PONV" (06/21/2017)   Former tobacco use    Gout    Hepatitis C    "treated and cured" (06/21/2017)   High cholesterol    History of blood transfusion    History of kidney stones    Hypertension    Ischemic cardiomyopathy    a. EF 25-30% at intraop TEE 4/17  //  b. Limited Echo 5/17 - EF 45-50%, mild ant HK. c. EF 55-65% by cath 09/2017.   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 HR dropping into 40s in 02/2016 -> BB reduced.   Stroke [facility]) 10/2016   "small one; sometimes my memory/cognitive issues" (06/21/2017)   Symptomatic hypotension    a. 02/2016 ER visit -> meds reduced.   Syncope    Wears dentures    Wears glasses     Past Surgical History:  Procedure Laterality Date   25 GAUGE PARS PLANA VITRECTOMY WITH 20 GAUGE MVR PORT  12/31/2020   Procedure: 25 GAUGE PARS PLANA VITRECTOMY WITH 20 GAUGE MVR PORT;  Surgeon: [name], MD;  Location: [facility]  OR;  Service: Thoracic;;   ANTERIOR CERVICAL DECOMP/DISCECTOMY FUSION N/A 10/17/2018   Procedure: Anterior Cervical Decompression Fusion - Cervical seven -Thoracic one;  Surgeon: Consuella Lose, MD;  Location: [place];  Service: Neurosurgery;  Laterality: N/A;   BASAL CELL CARCINOMA EXCISION     "forehead   BIOPSY  07/20/2019   Procedure: BIOPSY;  Surgeon: Carol Ada, MD;  Location: [facility] ENDOS[facility];  Service: Endoscopy;;   BIOPSY OF MEDIASTINAL MASS Right 07/29/2021   Procedure: RIGHT CHEST WALL MASS BIOPSY;  Surgeon: [name], MD;  Location: [place];  Service: Thoracic;  Laterality: Right;   BRONCHIAL BIOPSY  12/24/2020   Procedure: BRONCHIAL BIOPSIES;  Surgeon: Garner Nash, DO;  Location: [place] [facility];  Service: Pulmonary;;   BRONCHIAL BRUSHINGS  12/24/2020   Procedure: BRONCHIAL BRUSHINGS;  Surgeon: Garner Nash, DO;  Location: [place];  Service: Pulmonary;;   BRONCHIAL NEEDLE ASPIRATION BIOPSY  12/24/2020   Procedure: BRONCHIAL NEEDLE ASPIRATION BIOPSIES;  Surgeon: Garner Nash, DO;  Location: [place];  Service: Pulmonary;;   CARDIAC CATHETERIZATION N/A 11/28/2015   Procedure: Left Heart Cath and Coronary Angiography;  Surgeon: [name], MD;  Location: [place] CV LAB;  Service: Cardiovascular;  Laterality: N/A;    CARDIAC CATHETERIZATION N/A 11/28/2015   Procedure: Coronary Balloon Angioplasty;  Surgeon: [name], MD;  Location: [place] CV LAB;  Service: Cardiovascular;  Laterality: N/A;  ostial LAD   CARDIAC CATHETERIZATION N/A 11/28/2015   Procedure: Coronary/Graft Angiography;  Surgeon: [name], MD;  Location: [place] CV LAB;  Service: Cardiovascular;  Laterality: N/A;  coronaries only    CARDIAC CATHETERIZATION N/A 04/21/2016   Procedure: Left Heart Cath and Coronary Angiography;  Surgeon: Wellington Hampshire, MD;  Location: [place] [facility] LAB;  Service: Cardiovascular;  Laterality: N/A;   CARDIAC CATHETERIZATION N/A 06/14/2016   Procedure: Left Heart Cath and Cors/Grafts Angiography;  Surgeon: Lorretta Harp, MD;  Location: [place] CV LAB;  Service: Cardiovascular;  Laterality: N/A;   CARDIAC CATHETERIZATION N/A 09/08/2016   Procedure: Left Heart Cath and Cors/Grafts Angiography;  Surgeon: Wellington Hampshire, MD;  Location: [place] CV LAB;  Service: Cardiovascular;  Laterality: N/A;   CARDIAC CATHETERIZATION     CORONARY ARTERY BYPASS GRAFT N/A 11/28/2015   Procedure: CORONARY ARTERY BYPASS GRAFTING (CABG) TIMES FIVE USING LEFT INTERNAL MAMMARY ARTERY AND RIGHT GREATER SAPHENOUS,VIEN HARVEATED BY ENDOVIEN, INTRAOPPRATIVE TEE;  Surgeon: Gaye Pollack, MD;  Location: [place];  Service: Open Heart Surgery;  Laterality: N/A;   CORONARY STENT INTERVENTION N/A 10/05/2017   Procedure: CORONARY STENT INTERVENTION;  Surgeon: [name], MD;  Location: [place] CV LAB;  Service: Cardiovascular;  Laterality: N/A;   ESOPHAGOGASTRODUODENOSCOPY (EGD) WITH PROPOFOL N/A 07/20/2019   Procedure: ESOPHAGOGASTRODUODENOSCOPY (EGD) WITH PROPOFOL;  Surgeon: Carol Ada, MD;  [facility];  Service: Endoscopy;  Laterality: N/A;   ESOPHAGOGASTRODUODENOSCOPY (EGD) WITH PROPOFOL N/A 01/30/2021   Procedure: ESOPHAGOGASTRODUODENOSCOPY (EGD) WITH PROPOFOL;  Surgeon: Carol Ada,  MD;  [facility];  Service: Endoscopy;  Laterality: N/A;   FIDUCIAL MARKER PLACEMENT  12/24/2020   Procedure: FIDUCIAL DYE MARKER PLACEMENT;  Surgeon: Garner Nash, DO;  Location: [place] [facility];  Service: Pulmonary;;   HUMERUS SURGERY Right 1969   "tumor inside bone; filled it w/bone chips"   INTERCOSTAL NERVE BLOCK Right 12/24/2020   Procedure: INTERCOSTAL NERVE BLOCK;  Surgeon: [name], MD;  Location: [place];  Service: Thoracic;  Laterality: Right;   IR  IMAGING GUIDED PORT INSERTION  02/06/2021   LEFT HEART CATH AND CORS/GRAFTS ANGIOGRAPHY N/A 03/11/2017   Procedure: Left Heart Cath and Cors/Grafts Angiography;  Surgeon: Leonie Man, MD;  Location: [place] [facility] LAB;  Service: Cardiovascular;  Laterality: N/A;   LEFT HEART CATH AND CORS/GRAFTS ANGIOGRAPHY N/A 10/05/2017   Procedure: LEFT HEART CATH AND CORS/GRAFTS ANGIOGRAPHY;  Surgeon: [name], MD;  Location: [place] CV LAB;  Service: Cardiovascular;  Laterality: N/A;   LEFT HEART CATH AND CORS/GRAFTS ANGIOGRAPHY N/A 04/11/2019   Procedure: LEFT HEART CATH AND CORS/GRAFTS ANGIOGRAPHY;  Surgeon: [name], MD;  Location: [place] [facility];  Service: Cardiovascular;  Laterality: N/A;   NODE DISSECTION Right 12/24/2020   Procedure: NODE DISSECTION;  Surgeon: [name], MD;  Location: [place];  Service: Thoracic;  Laterality: Right;   PERIPHERAL VASCULAR CATHETERIZATION N/A 06/14/2016   Procedure: Lower Extremity Angiography;  Surgeon: Lorretta Harp, MD;  Location: [place] [facility] LAB;  Service: Cardiovascular;  Laterality: N/A;   VIDEO BRONCHOSCOPY WITH ENDOBRONCHIAL NAVIGATION Right 12/24/2020   Procedure: VIDEO BRONCHOSCOPY WITH ENDOBRONCHIAL NAVIGATION;  Surgeon: Garner Nash, DO;  Location: [place];  Service: Pulmonary;  Laterality: Right;   VIDEO BRONCHOSCOPY WITH ENDOBRONCHIAL ULTRASOUND N/A 12/24/2020   Procedure: VIDEO BRONCHOSCOPY WITH ENDOBRONCHIAL ULTRASOUND;   Surgeon: Garner Nash, DO;  Location: [place];  Service: Pulmonary;  Laterality: N/A;   VIDEO BRONCHOSCOPY WITH INSERTION OF INTERBRONCHIAL VALVE (IBV) N/A 12/31/2020   Procedure: VIDEO BRONCHOSCOPY WITH INSERTION OF INTERBRONCHIAL VALVE (IBV).VALVE IN CARTRIDGE 82mm,9mm. CHEST TUBE PLACEMENT.;  Surgeon: [name], MD;  Location: [facility] OR;  Service: Thoracic;  Laterality: N/A;   VIDEO BRONCHOSCOPY WITH INSERTION OF INTERBRONCHIAL VALVE (IBV) N/A 07/29/2021   Procedure: VIDEO BRONCHOSCOPY WITH REMOVAL OF INTERBRONCHIAL VALVE (IBV);  Surgeon: [name], MD;  Location: [facility] OR;  Service: Thoracic;  Laterality: N/A;     Current Outpatient Medications  Medication Sig Dispense Refill   acetaminophen (TYLENOL) 325 MG tablet Take 1-2 tablets (325-650 mg total) by mouth every 4 (four) hours as needed for mild pain.     benzonatate (TESSALON) 100 MG capsule Take 1 capsule (100 mg total) by mouth 3 (three) times daily as needed for cough. TAKE 1 CAPSULE(100 MG) BY MOUTH THREE TIMES DAILY AS NEEDED FOR COUGH Strength: 100 mg 30 capsule 2   bisacodyl (DULCOLAX) 5 MG EC tablet Take 1 tablet (5 mg total) by mouth daily as needed for moderate constipation. 30 tablet 0   diclofenac Sodium (VOLTAREN) 1 % GEL Apply 2 g topically 4 (four) times daily. 100 g 0   docusate sodium (COLACE) 100 MG capsule Take 1 capsule (100 mg total) by mouth 3 (three) times daily. 10 capsule 0   escitalopram (LEXAPRO) 5 MG tablet Take 1 tablet (5 mg total) by mouth at bedtime. 30 tablet 0   HYDROmorphone (DILAUDID) 8 MG tablet Take 1 tablet by mouth every 6 hours as needed for severe pain. 60 tablet 0   lidocaine-prilocaine (EMLA) cream Apply to the Port-A-Cath site 30-60-minute before treatment (Patient taking differently: Apply 1 Application topically once as needed (prior to port access - 30-60 minutes before treatment).) 30 g 0   magnesium gluconate (MAGONATE) 500 MG tablet Take 0.5 tablets (250 mg total) by  mouth at bedtime. 30 tablet 0   meloxicam (MOBIC) 15 MG tablet Take 1 tablet (15 mg total) by mouth daily. 30 tablet 0   methocarbamol (ROBAXIN) 500 MG tablet Take  1 tablet (500 mg total) by mouth every 6 (six) hours as needed for muscle spasms. 90 tablet 3   midodrine (PROAMATINE) 10 MG tablet Take 1 tablet (10 mg total) by mouth 3 (three) times daily with meals. 90 tablet 0   morphine (MS CONTIN) 60 MG 12 hr tablet Take 1 tablet by mouth every 12  hours. 60 tablet 0   naloxone (NARCAN) nasal spray 4 mg/0.1 mL Place 1 spray intranasally into 1 nostril. If desired response not obtained after 2 or 3 minutes, may repeat 1 spray into other nostril 1 each 1   ondansetron (ZOFRAN) 8 MG tablet Take 1 tablet by mouth every 8 hours as needed for nausea or vomiting. Starting 3 days after chemotherapy 90 tablet 2   polyethylene glycol (MIRALAX / GLYCOLAX) 17 g packet Take 17 g by mouth daily. 14 each 0   prochlorperazine (COMPAZINE) 10 MG tablet Take 10 mg by mouth every 6 (six) hours as needed for nausea or vomiting.     scopolamine (TRANSDERM-SCOP) 1 MG/3DAYS Place 1 patch (1.5 mg total) onto the skin every 3 (three) days. 10 patch 0   Vitamin D, Ergocalciferol, (DRISDOL) 1.25 MG (50000 UNIT) CAPS capsule Take 1 capsule (50,000 Units total) by mouth every 7 (seven) days. 5 capsule 0   XARELTO 20 MG TABS tablet Take 1 tablet (20 mg total) by mouth every evening. 90 tablet 1   No current facility-administered medications for this visit.    Allergies:   Prednisone, Tetanus toxoids, Wellbutrin [bupropion], Gabapentin, Morphine, Tetanus antitoxin, Varenicline tartrate, Lorazepam, and Varenicline    Social History:  The patient  reports that he quit smoking about 6 years ago. His smoking use included cigarettes. He has a 33.00 pack-year smoking history. He has never used smokeless tobacco. He reports that he does not currently use alcohol. He reports that he does not use drugs.   Family History:  The  patient's ***family history includes Heart Problems in his father; Heart attack in his maternal grandmother and paternal uncle; Heart attack (age of onset: 56) in his father; Heart failure in his father; Hypertension in his brother; Lung cancer in his mother; Stroke in his father and maternal grandmother.    ROS:  Please see the history of present illness.   Otherwise, review of systems are positive for ***.   All other systems are reviewed and negative.    PHYSICAL EXAM: VS:  There were no vitals taken for this visit. , BMI There is no height or weight on file to calculate BMI. GEN: Well nourished, well developed, in no acute distress HEENT: normal Neck: no JVD, carotid bruits, or masses Cardiac: ***RRR; no murmurs, rubs, or gallops,no edema  Respiratory:  clear to auscultation bilaterally, normal work of breathing GI: soft, nontender, nondistended, + BS MS: no deformity or atrophy Skin: warm and dry, no rash Neuro:  Strength and sensation are intact Psych: euthymic mood, full affect   EKG:   The ekg ordered today demonstrates ***   Recent Labs: 03/30/2022: TSH 1.711 05/14/2022: ALT 12; Magnesium 1.9 05/24/2022: BUN 21; Creatinine, Ser 1.13; Hemoglobin 9.1; Platelets 199; Potassium 4.0; Sodium 139   Lipid Panel    Component Value Date/Time   CHOL 82 12/27/2020 0125   CHOL 114 11/08/2017 0833   TRIG 97 12/27/2020 0125   HDL 35 (L) 12/27/2020 0125   HDL 52 11/08/2017 0833   CHOLHDL 2.3 12/27/2020 0125   VLDL 19 12/27/2020 0125   LDLCALC 28 12/27/2020 0125  [place] 43 11/08/2017 7615     Other studies Reviewed: Additional studies/ records that were reviewed today with results demonstrating: ***.   ASSESSMENT AND PLAN:  CAD: s/p CABG and subsequent PCI.  PAF:  Anemia: PE: Xarelto.  HTN: Hyperlipidemia:   Current medicines are reviewed at length with the patient today.  The patient concerns regarding his medicines were addressed.  The following changes have been  made:  No change***  Labs/ tests ordered today include: *** No orders of the defined types were placed in this encounter.   Recommend 150 minutes/week of aerobic exercise Low fat, low carb, high fiber diet recommended  Disposition:   FU in ***   Signed, Larae Grooms, MD  06/10/2022 7:27 AM    [facility] [address] [place], [place], [place]  [address] Phone: [phone]; Fax: [phone]

## 2022-06-10 NOTE — Telephone Encounter (Signed)
Mrs Valbuena called and wants to speak with Dr Ranell Patrick because Buck' trembling is much worse and the referral place cannot see him until 07/27/22. Please call her because it is much worse. [phone].

## 2022-06-11 ENCOUNTER — Ambulatory Visit: Payer: Medicare Other | Admitting: Interventional Cardiology

## 2022-06-11 ENCOUNTER — Encounter (HOSPITAL_BASED_OUTPATIENT_CLINIC_OR_DEPARTMENT_OTHER): Payer: Medicare Other | Admitting: Physical Medicine and Rehabilitation

## 2022-06-11 ENCOUNTER — Other Ambulatory Visit: Payer: Self-pay | Admitting: Nurse Practitioner

## 2022-06-11 ENCOUNTER — Other Ambulatory Visit (HOSPITAL_COMMUNITY): Payer: Self-pay

## 2022-06-11 DIAGNOSIS — R630 Anorexia: Secondary | ICD-10-CM

## 2022-06-11 DIAGNOSIS — E785 Hyperlipidemia, unspecified: Secondary | ICD-10-CM

## 2022-06-11 DIAGNOSIS — C7A1 Malignant poorly differentiated neuroendocrine tumors: Secondary | ICD-10-CM

## 2022-06-11 DIAGNOSIS — Z515 Encounter for palliative care: Secondary | ICD-10-CM

## 2022-06-11 DIAGNOSIS — I951 Orthostatic hypotension: Secondary | ICD-10-CM

## 2022-06-11 DIAGNOSIS — I10 Essential (primary) hypertension: Secondary | ICD-10-CM

## 2022-06-11 DIAGNOSIS — I480 Paroxysmal atrial fibrillation: Secondary | ICD-10-CM

## 2022-06-11 DIAGNOSIS — R251 Tremor, unspecified: Secondary | ICD-10-CM | POA: Diagnosis not present

## 2022-06-11 DIAGNOSIS — R110 Nausea: Secondary | ICD-10-CM | POA: Diagnosis not present

## 2022-06-11 DIAGNOSIS — G893 Neoplasm related pain (acute) (chronic): Secondary | ICD-10-CM

## 2022-06-11 DIAGNOSIS — I25119 Atherosclerotic heart disease of native coronary artery with unspecified angina pectoris: Secondary | ICD-10-CM

## 2022-06-11 MED ORDER — MORPHINE SULFATE ER 60 MG PO TBCR
60.0000 mg | EXTENDED_RELEASE_TABLET | Freq: Two times a day (BID) | ORAL | 0 refills | Status: DC
  Filled 2022-06-11 (×2): qty 60, 30d supply, fill #0

## 2022-06-11 MED ORDER — HYDROMORPHONE HCL 8 MG PO TABS
8.0000 mg | ORAL_TABLET | Freq: Four times a day (QID) | ORAL | 0 refills | Status: DC | PRN
  Filled 2022-06-11: qty 60, 15d supply, fill #0

## 2022-06-11 MED ORDER — MEGESTROL ACETATE 400 MG/10ML PO SUSP: 400.0000 mg | Freq: Two times a day (BID) | ORAL | 0 refills | Status: DC

## 2022-06-11 MED ORDER — PROPRANOLOL HCL 10 MG PO TABS: 10.0000 mg | ORAL_TABLET | Freq: Every day | ORAL | 3 refills | Status: DC

## 2022-06-11 MED ORDER — MAGNESIUM CITRATE PO SOLN: 1.0000 | Freq: Once | ORAL | 3 refills | Status: AC

## 2022-06-11 NOTE — Progress Notes (Signed)
   Subjective:    Patient ID: [name], male    DOB: 02/16/1956, 66 y.o.   MRN: [id number]  HPI Mr. Suder is 66 year old man who presents with debility:  1) tremor -he is unable to type -has worsened at home. -his wife would like for him to try propanolol -he discussed with his oncologist as well  2) dizziness -BP drops with standing  3) pain from metastases -he is taking meloxicam daily -he is not using the voltaren gel as frequently.   4) decreased appetite -wife reports a 10 lb weight loss that was very rapid -she feels he needs a medication to help him eat better  5) Nausea -nausea has currently worsened  -he has been taking compazine and zofran without any improvement -he has not been using scopolamine patch but has some at home -his last BM was the day before yesterday -he was "backed up" earlier this week.     Review of Systems +pain from metastases +dizziness with standing +tremor   +nausea +constipation Objective:   Physical Exam Not performed      Assessment & Plan:  1) Debility -continue handicap placard -continue home therapies -continue cane  2) Orthostatic hypotension -used teds and abdominal binder   3) Pain from metastases: Continue meloxicam prn -continue voltaren gel 4 times per day -discussed ketogenic diet -continue muscle relaxer -prescribed Zynex cold therapy blanket  4) Abdominal pain possibly secondary to constipation -mag citrate ordered -recommended eating 1-3 apples per day -Provided list of following foods that help with constipation and highlighted a few: 1) prunes- contain high amounts of fiber.  2) apples- has a form of dietary fiber called pectin that accelerates stool movement and increases beneficial gut bacteria 3) pears- in addition to fiber, also high in fructose and sorbitol which have laxative effect 4) figs- contain an enzyme ficin which helps to speed colonic transit 5) kiwis- contain an enzyme  actinidin that improves gut motility and reduces constipation 6) oranges- rich in pectin (like apples) 7) grapefruits- contain a flavanol naringenin which has a laxative effect 8) vegetables- rich in fiber and also great sources of folate, vitamin C, and K 9) artichoke- high in inulin, prebiotic great for the microbiome 10) chicory- increases stool frequency and softness (can be added to coffee) 11) rhubarb- laxative effect 12) sweet potato- high fiber 13) beans, peas, and lentils- contain both soluble and insoluble fiber 14) chia seeds- improves intestinal health and gut flora 15) flaxseeds- laxative effect 16) whole grain rye bread- high in fiber 17) oat bran- high in soluble and insoluble fiber 18) kefir- softens stools -recommended to try at least one of these foods every day.  -drink 6-8 glasses of water per day -walk regularly, especially after meals.   5) Nausea -discussed that this could be secondary to constipation -discussed that compazine and zofran can cause constipation -recommended ginger -recommended high fiber foods to help with constipation, or trying the mag citrate to clear him out  6) Decreased appetite: -prescribed megace 400mg  BID  20 minutes spent in discussion of his nausea, constipation, decreased appetite, discussed eating high fiber foods to help with constipation, discussed that he does not like prunes but he likes apples and when he was eating apples last year he had no constipation issues, discussed trying mag citrate to help with constipation

## 2022-06-14 ENCOUNTER — Other Ambulatory Visit (HOSPITAL_COMMUNITY): Payer: Self-pay

## 2022-06-15 ENCOUNTER — Inpatient Hospital Stay: Payer: Medicare Other | Admitting: Nurse Practitioner

## 2022-06-21 ENCOUNTER — Telehealth: Payer: Self-pay

## 2022-06-21 NOTE — Telephone Encounter (Signed)
Refill request for Midodrine. Is this okay?

## 2022-06-22 ENCOUNTER — Other Ambulatory Visit: Payer: Self-pay

## 2022-06-22 MED ORDER — MELOXICAM 15 MG PO TABS: 15.0000 mg | ORAL_TABLET | Freq: Every day | ORAL | 5 refills | Status: DC

## 2022-06-22 MED ORDER — VITAMIN D (ERGOCALCIFEROL) 1.25 MG (50000 UNIT) PO CAPS: 50000.0000 [IU] | ORAL_CAPSULE | ORAL | 5 refills | Status: AC

## 2022-06-25 ENCOUNTER — Other Ambulatory Visit (HOSPITAL_COMMUNITY): Payer: Self-pay

## 2022-06-28 ENCOUNTER — Other Ambulatory Visit: Payer: Self-pay | Admitting: Nurse Practitioner

## 2022-06-28 ENCOUNTER — Other Ambulatory Visit (HOSPITAL_COMMUNITY): Payer: Self-pay

## 2022-06-28 DIAGNOSIS — Z515 Encounter for palliative care: Secondary | ICD-10-CM

## 2022-06-28 DIAGNOSIS — G893 Neoplasm related pain (acute) (chronic): Secondary | ICD-10-CM

## 2022-06-28 DIAGNOSIS — C7A1 Malignant poorly differentiated neuroendocrine tumors: Secondary | ICD-10-CM

## 2022-06-28 MED ORDER — HYDROMORPHONE HCL 8 MG PO TABS
8.0000 mg | ORAL_TABLET | Freq: Four times a day (QID) | ORAL | 0 refills | Status: DC | PRN
  Filled 2022-06-28: qty 60, 15d supply, fill #0

## 2022-06-29 ENCOUNTER — Other Ambulatory Visit: Payer: Self-pay | Admitting: Physical Medicine and Rehabilitation

## 2022-07-01 ENCOUNTER — Inpatient Hospital Stay: Payer: Medicare Other | Attending: Physician Assistant | Admitting: Nurse Practitioner

## 2022-07-01 ENCOUNTER — Other Ambulatory Visit: Payer: Self-pay

## 2022-07-01 ENCOUNTER — Encounter: Payer: Self-pay | Admitting: Nurse Practitioner

## 2022-07-01 VITALS — BP 114/75 | HR 139 | Resp 19 | Wt 129.0 lb

## 2022-07-01 DIAGNOSIS — Z7189 Other specified counseling: Secondary | ICD-10-CM

## 2022-07-01 DIAGNOSIS — C7A8 Other malignant neuroendocrine tumors: Secondary | ICD-10-CM | POA: Diagnosis not present

## 2022-07-01 DIAGNOSIS — R530 Neoplastic (malignant) related fatigue: Secondary | ICD-10-CM | POA: Diagnosis not present

## 2022-07-01 DIAGNOSIS — G893 Neoplasm related pain (acute) (chronic): Secondary | ICD-10-CM | POA: Insufficient documentation

## 2022-07-01 DIAGNOSIS — C7A1 Malignant poorly differentiated neuroendocrine tumors: Secondary | ICD-10-CM | POA: Diagnosis not present

## 2022-07-01 DIAGNOSIS — Z515 Encounter for palliative care: Secondary | ICD-10-CM | POA: Diagnosis not present

## 2022-07-01 DIAGNOSIS — R634 Abnormal weight loss: Secondary | ICD-10-CM | POA: Diagnosis not present

## 2022-07-01 DIAGNOSIS — R630 Anorexia: Secondary | ICD-10-CM

## 2022-07-01 DIAGNOSIS — C7B8 Other secondary neuroendocrine tumors: Secondary | ICD-10-CM | POA: Insufficient documentation

## 2022-07-01 DIAGNOSIS — R531 Weakness: Secondary | ICD-10-CM

## 2022-07-01 NOTE — Progress Notes (Signed)
[place]  [phone]) [phone] [phone]) [phone]   Name: [name] Date: 07/01/2022 MRN: [id number]  DOB: [birth year]  Patient Care Team: [name], MD as PCP - General (Family Medicine) [name], MD as PCP - Cardiology (Cardiology) Lissa Morales, MD as PCP - Hematology/Oncology (Oncology) Garner Nash, DO as Consulting Physician (Pulmonary Disease) Curt Bears, MD as Consulting Physician (Oncology) Pickenpack-Cousar, Carlena Sax, NP as Nurse Practitioner (Nurse Practitioner)    INTERVAL HISTORY: [name] is a 66 y.o. 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 He was admitted on 08/29/2021 and 09/15/21 from home with uncontrolled intractable chest wall pain. He has completed all of his radiation treatments. Now with plans to undergo immunotherapy as recommended by [facility].   SOCIAL HISTORY:     reports that he quit smoking about 6 years ago. His smoking use included cigarettes. He has a 33.00 pack-year smoking history. He has never used smokeless tobacco. He reports that he does not currently use alcohol. He reports that he does not use drugs.  ADVANCE DIRECTIVES:  None on file   CODE STATUS:   PAST MEDICAL HISTORY: Past Medical History:  Diagnosis Date   Anemia    Anxiety    Arthritis    Basal cell carcinoma (BCC) of forehead    CAD (coronary artery disease)    a. 10/2015 ant STEMI >> LHC with 3 v CAD; oLAD tx with POBA >> emergent CABG. b. Multiple evals since that time, early graft failure of SVG-RCA by cath 03/2016. c.  2/19 PCI/DES x1 to pRCA, normal EF.   Carotid artery disease ([place])    a. 40-59% BICA 02/2018.   Depression    Dyspnea    Ectopic atrial tachycardia    Esophageal reflux    eosinophil esophagitis   Family history of adverse reaction to anesthesia    "sister has PONV" (06/21/2017)   Former tobacco use    Gout    Hepatitis C    "treated and cured" (06/21/2017)   High cholesterol    History of blood transfusion    History of kidney stones    Hypertension    Ischemic cardiomyopathy    a. EF 25-30% at intraop TEE 4/17  //  b. Limited Echo 5/17 - EF 45-50%, mild ant HK. c. EF 55-65% by cath 09/2017.   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[place])    Migraine    "3-4/yr" (06/21/2017)   Myocardial infarction ([place]) 10/2015   Palpitations    Pneumonia    Sinus bradycardia    a. HR dropping into 40s in 02/2016 -> BB reduced.   Stroke [facility]) 10/2016   "small one; sometimes my memory/cognitive issues" (06/21/2017)   Symptomatic hypotension    a. 02/2016 ER visit -> meds reduced.   Syncope    Wears dentures    Wears glasses     ALLERGIES:  is allergic to prednisone, tetanus toxoids, wellbutrin [bupropion], gabapentin, morphine, tetanus antitoxin, varenicline tartrate, lorazepam, and varenicline.  MEDICATIONS:  Current Outpatient Medications  Medication Sig Dispense Refill   acetaminophen (TYLENOL) 325 MG tablet Take 1-2 tablets (325-650 mg total) by mouth every 4 (four)  hours as needed for mild pain.     benzonatate (TESSALON) 100 MG capsule Take 1 capsule (100 mg total) by mouth 3 (three) times daily as needed for cough. TAKE 1 CAPSULE(100 MG) BY MOUTH THREE TIMES DAILY AS NEEDED FOR COUGH Strength: 100 mg 30 capsule 2   bisacodyl (DULCOLAX) 5 MG EC tablet Take 1 tablet (5 mg total) by mouth daily as needed for moderate constipation. 30 tablet 0   diclofenac Sodium (VOLTAREN) 1 % GEL Apply 2 g topically 4 (four) times daily. 100 g 0   docusate sodium (COLACE) 100 MG capsule Take 1 capsule (100  mg total) by mouth 3 (three) times daily. 10 capsule 0   escitalopram (LEXAPRO) 5 MG tablet Take 1 tablet (5 mg total) by mouth at bedtime. 30 tablet 0   HYDROmorphone (DILAUDID) 8 MG tablet Take 1 tablet by mouth every 6 hours as needed for severe pain. 60 tablet 0   lidocaine-prilocaine (EMLA) cream Apply to the Port-A-Cath site 30-60-minute before treatment (Patient taking differently: Apply 1 Application topically once as needed (prior to port access - 30-60 minutes before treatment).) 30 g 0   magnesium gluconate (MAGONATE) 500 MG tablet Take 0.5 tablets (250 mg total) by mouth at bedtime. 30 tablet 0   megestrol (MEGACE) 40 MG/ML suspension SHAKE LIQUID AND TAKE 10 ML(400 MG) BY MOUTH TWICE DAILY 240 mL 0   meloxicam (MOBIC) 15 MG tablet Take 1 tablet (15 mg total) by mouth daily. 30 tablet 5   methocarbamol (ROBAXIN) 500 MG tablet Take 1 tablet (500 mg total) by mouth every 6 (six) hours as needed for muscle spasms. 90 tablet 3   midodrine (PROAMATINE) 10 MG tablet Take 1 tablet (10 mg total) by mouth 3 (three) times daily with meals. 90 tablet 0   morphine (MS CONTIN) 60 MG 12 hr tablet Take 1 tablet (60 mg total) by mouth every 12 (twelve) hours. 60 tablet 0   naloxone (NARCAN) nasal spray 4 mg/0.1 mL Place 1 spray intranasally into 1 nostril. If desired response not obtained after 2 or 3 minutes, may repeat 1 spray into other nostril 1 each 1   ondansetron (ZOFRAN) 8 MG tablet Take 1 tablet by mouth every 8 hours as needed for nausea or vomiting. Starting 3 days after chemotherapy 90 tablet 2   polyethylene glycol (MIRALAX / GLYCOLAX) 17 g packet Take 17 g by mouth daily. 14 each 0   prochlorperazine (COMPAZINE) 10 MG tablet Take 10 mg by mouth every 6 (six) hours as needed for nausea or vomiting.     propranolol (INDERAL) 10 MG tablet Take 1 tablet (10 mg total) by mouth daily. 90 tablet 3   scopolamine (TRANSDERM-SCOP) 1 MG/3DAYS Place 1 patch (1.5 mg total) onto the skin every 3 (three)  days. 10 patch 0   Vitamin D, Ergocalciferol, (DRISDOL) 1.25 MG (50000 UNIT) CAPS capsule Take 1 capsule (50,000 Units total) by mouth every 7 (seven) days. 5 capsule 5   XARELTO 20 MG TABS tablet Take 1 tablet (20 mg total) by mouth every evening. 90 tablet 1   No current facility-administered medications for this visit.    VITAL SIGNS: BP 114/75 (BP Location: Left Arm, Patient Position: Sitting)   Pulse (!) 139 Comment: RN notified  Resp 19   Wt 129 lb (58.5 kg)   SpO2 99%   BMI 19.05 kg/m  Filed Weights   07/01/22 1036  Weight: 129 lb (58.5 kg)     Estimated body mass  index is 19.05 kg/m as calculated from the following:   Height as of 06/07/22: 5\' 9"  (1.753 m).   Weight as of this encounter: 129 lb (58.5 kg).   PERFORMANCE STATUS (ECOG) : 1 - Symptomatic but completely ambulatory   Physical Exam General: NAD, thin, weak, ill-appearing, in a wheelchair, weak Pulmonary: dyspnea on exertion, diminished  Cardio: Tachycardic  Skin: thin, dry, muscle wasting  Neurological: AAOx 4, tremors, gait unsteady  IMPRESSION:  [name] presents to the clinic today for symptom management follow-up. His wife is with him today. [name] unfortunately is extremely weak. He is actively being followed by [facility]. Wife states they have an appointment tomorrow with radiation oncology team. He is complaining of dyspnea and increased fatigue with exertion. He has a cane that he uses for stability. Also is concerned that he is not sleeping well at night. I acknowledged concerns however have requested he discussed medications for insomnia with his [facility] team on tomorrow. Given they are guiding his oncology treatment, patient's increased weakness and fatigue, would not be comfortable at this time prescribing additional medications without approval from their team to prevent further decline or changes.   Wife shared per plans discussed with [facility] if patient's performance status was to improve  or further changes the team advised they could potentially consider a different regimen. He has been discontinued from previous clinic trial.   He is tachycardic however reports he did not take his medications today. Heart rate increases as we ambulate with him to monitor his hypoxia. His oxygen saturations resting with no oxygen were in the low 90s (93). When ambulating he was obviously easily fatigued and became quite dyspneic with exertion. He required 2 person assistance. We will order home oxygen as needed for additional support. He has appointment in several weeks with the Neurologist due to latent brain radiation effects and tremors.   His weight is down to 129 lbs from 147lbs on 8/1.   Neoplasm related pain Pain remains controlled on current regimen. He was started on mobic and robaxin by his rehabilitation provider. Continues to tolerate prescribed regimen including MS Contin 60 mg and hydromorphone 8mg . We will continue to closely monitor.   2. Constipation Denies concerns with constipation.    3. Goals of Care  [name] and his wife continue to remain hopeful for some stability. They are being managed by the team at [facility]. Encouraged to continue with close follow-up. If goals become to focus solely on his comfort with no further oncological interventions [name] is a candidate for in home hospice support given poor long-term prognosis. Ongoing goals of care discussions.   PLAN: MS Contin 60 mg every 12 hours Dilaudid 8 mg as needed for breakthrough pain  Zofran and compazine as needed for ongoing nausea/vomiting.  Miralax and Colace daily for bowel regimen. We will continue to closely monitor medications and evaluate as needed.  He is concerned about insomnia.  Due to his ongoing weakness, fatigue, and dyspnea with exertion have requested patient to follow-up with his [facility] oncology team for recommendations on additional medications in the setting of goals to continue with  treatment. Significant dyspnea with exertion and fatigue.  Will order home oxygen 2 L nasal cannula as needed pending insurance approval.   Ongoing goals of care discussions. I will plan to see him back in 4-6 weeks in collaboration with his other oncology appointments.  He knows to call with any needs prior to next visit.   Patient expressed understanding  and was in agreement with this plan. He also understands that He can call the clinic at any time with any questions, concerns, or complaints.       Any controlled substances utilized were prescribed in the context of palliative care. PDMP has been reviewed.    Any controlled substances utilized were prescribed in the context of palliative care. PDMP has been reviewed.    Time Total: 50 min   Visit consisted of counseling and education dealing with the complex and emotionally intense issues of symptom management and palliative care in the setting of serious and potentially life-threatening illness.Greater than 50%  of this time was spent counseling and coordinating care related to the above assessment and plan.  Alda Lea, AGPCNP-BC  Palliative Medicine Team/[facility] [place]

## 2022-07-02 ENCOUNTER — Telehealth: Payer: Self-pay | Admitting: Physical Medicine and Rehabilitation

## 2022-07-02 ENCOUNTER — Encounter
Payer: Medicare Other | Attending: Physical Medicine and Rehabilitation | Admitting: Physical Medicine and Rehabilitation

## 2022-07-02 ENCOUNTER — Telehealth: Payer: Self-pay | Admitting: *Deleted

## 2022-07-02 DIAGNOSIS — C7951 Secondary malignant neoplasm of bone: Secondary | ICD-10-CM | POA: Insufficient documentation

## 2022-07-02 DIAGNOSIS — R413 Other amnesia: Secondary | ICD-10-CM | POA: Insufficient documentation

## 2022-07-02 DIAGNOSIS — G8929 Other chronic pain: Secondary | ICD-10-CM | POA: Insufficient documentation

## 2022-07-02 DIAGNOSIS — R251 Tremor, unspecified: Secondary | ICD-10-CM | POA: Insufficient documentation

## 2022-07-02 DIAGNOSIS — M546 Pain in thoracic spine: Secondary | ICD-10-CM | POA: Insufficient documentation

## 2022-07-02 DIAGNOSIS — G893 Neoplasm related pain (acute) (chronic): Secondary | ICD-10-CM | POA: Insufficient documentation

## 2022-07-02 DIAGNOSIS — Z789 Other specified health status: Secondary | ICD-10-CM | POA: Insufficient documentation

## 2022-07-02 DIAGNOSIS — R479 Unspecified speech disturbances: Secondary | ICD-10-CM | POA: Insufficient documentation

## 2022-07-02 DIAGNOSIS — G4701 Insomnia due to medical condition: Secondary | ICD-10-CM | POA: Diagnosis not present

## 2022-07-02 MED ORDER — PROPRANOLOL HCL 10 MG PO TABS: 10.0000 mg | ORAL_TABLET | Freq: Two times a day (BID) | ORAL | 3 refills | Status: DC

## 2022-07-02 MED ORDER — MIDODRINE HCL 10 MG PO TABS: 10.0000 mg | ORAL_TABLET | Freq: Three times a day (TID) | ORAL | 0 refills | Status: AC

## 2022-07-02 MED ORDER — TRAZODONE HCL 50 MG PO TABS: 50.0000 mg | ORAL_TABLET | Freq: Every day | ORAL | 3 refills | Status: AC

## 2022-07-02 NOTE — Telephone Encounter (Signed)
[name] has more things wrong his brain than when he saw Dr Ranell Patrick  . He can barely remember his phone number now--memory issues. He is supposed to see neurology 11/28 but is wanting a call from Dr Ranell Patrick.Marland Kitchen

## 2022-07-02 NOTE — Telephone Encounter (Signed)
Duplicate message. 

## 2022-07-02 NOTE — Telephone Encounter (Signed)
Per patient's wife patient is experiencing more concerning brain issues. He is experiencing difficulty with memory.   He has an appt. With [facility] on the 28th.   Please call wife to further discuss concerns

## 2022-07-02 NOTE — Addendum Note (Signed)
Addended by: Jasmine December T on: 07/02/2022 04:20 PM   Modules accepted: Orders

## 2022-07-05 ENCOUNTER — Telehealth: Payer: Self-pay

## 2022-07-05 NOTE — Progress Notes (Signed)
Subjective:    Patient ID: [name], male    DOB: 1956-02-24, 66 y.o.   MRN: [id number]  HPI An audio/video tele-health visit is felt to be the most appropriate encounter for this patient at this time. This is a follow up tele-visit via phone. The patient is at home. MD is at office. Prior to scheduling this appointment, our staff discussed the limitations of evaluation and management by telemedicine and the availability of in-person appointments. The patient expressed understanding and agreed to proceed.   [name] is 66 year old man who presents for follow-up of debility and impaired cognition.   1) tremor -he is unable to type -has worsened at home. -his wife would like for him to try propanolol -he discussed with his oncologist as well -continues to be severe -wife would like for Propanolol to be increased to BID  2) dizziness -BP drops with standing  3) pain from metastases -he is taking meloxicam daily -he is not using the voltaren gel as frequently.   4) decreased appetite -wife reports a 10 lb weight loss that was very rapid -she feels he needs a medication to help him eat better  5) Nausea -nausea has currently worsened  -he has been taking compazine and zofran without any improvement -he has not been using scopolamine patch but has some at home -his last BM was the day before yesterday -he was "backed up" earlier this week.   6) Insomnia: -has not been sleeping well  7) Impaired cognition -had follow-up with oncology today -he would be interested in seeing a neuropsychologist    Review of Systems +pain from metastases +dizziness with standing +tremor   +nausea +constipation Objective:   Physical Exam Not performed      Assessment & Plan:  1) Debility -continue handicap placard -continue home therapies -continue cane  2) Orthostatic hypotension -used teds and abdominal binder   3) Pain from metastases: Continue meloxicam  prn -continue voltaren gel 4 times per day -discussed ketogenic diet -continue muscle relaxer -prescribed Zynex cold therapy blanket  4) Abdominal pain possibly secondary to constipation -mag citrate ordered -recommended eating 1-3 apples per day -Provided list of following foods that help with constipation and highlighted a few: 1) prunes- contain high amounts of fiber.  2) apples- has a form of dietary fiber called pectin that accelerates stool movement and increases beneficial gut bacteria 3) pears- in addition to fiber, also high in fructose and sorbitol which have laxative effect 4) figs- contain an enzyme ficin which helps to speed colonic transit 5) kiwis- contain an enzyme actinidin that improves gut motility and reduces constipation 6) oranges- rich in pectin (like apples) 7) grapefruits- contain a flavanol naringenin which has a laxative effect 8) vegetables- rich in fiber and also great sources of folate, vitamin C, and K 9) artichoke- high in inulin, prebiotic great for the microbiome 10) chicory- increases stool frequency and softness (can be added to coffee) 11) rhubarb- laxative effect 12) sweet potato- high fiber 13) beans, peas, and lentils- contain both soluble and insoluble fiber 14) chia seeds- improves intestinal health and gut flora 15) flaxseeds- laxative effect 16) whole grain rye bread- high in fiber 17) oat bran- high in soluble and insoluble fiber 18) kefir- softens stools -recommended to try at least one of these foods every day.  -drink 6-8 glasses of water per day -walk regularly, especially after meals.   5) Nausea -discussed that this could be secondary to constipation -discussed that compazine  and zofran can cause constipation -recommended ginger -recommended high fiber foods to help with constipation, or trying the mag citrate to clear him out  6) Decreased appetite: -prescribed megace 400mg  BID  7) Impaired cognition: -referred to  neuropsychology  8) Insomnia -start trazodone 50mg  HS  5 minutes spent in discussing his worsening cognition, referring to neuropsych for cognitive testing, insomnia, starting Trazodone to help him stay asleep

## 2022-07-05 NOTE — Telephone Encounter (Signed)
Mrs. Seeley called: Mr. Kendzierski is requesting in the home speech & language therapy. If granted please send the order to:   [facility]   Phone [phone] Fax [phone] [address],  [place] [address] .

## 2022-07-06 ENCOUNTER — Emergency Department (HOSPITAL_BASED_OUTPATIENT_CLINIC_OR_DEPARTMENT_OTHER): Payer: Medicare Other

## 2022-07-06 ENCOUNTER — Encounter (HOSPITAL_BASED_OUTPATIENT_CLINIC_OR_DEPARTMENT_OTHER): Payer: Self-pay | Admitting: Emergency Medicine

## 2022-07-06 ENCOUNTER — Emergency Department (HOSPITAL_BASED_OUTPATIENT_CLINIC_OR_DEPARTMENT_OTHER)
Admission: EM | Admit: 2022-07-06 | Discharge: 2022-07-06 | Disposition: A | Discharge status: Home / Self Care | Payer: Medicare Other | Attending: Emergency Medicine | Admitting: Emergency Medicine

## 2022-07-06 ENCOUNTER — Other Ambulatory Visit: Payer: Self-pay

## 2022-07-06 ENCOUNTER — Emergency Department (HOSPITAL_BASED_OUTPATIENT_CLINIC_OR_DEPARTMENT_OTHER): Admission: EM | Admit: 2022-07-06 | Payer: Medicare Other | Source: Home / Self Care

## 2022-07-06 DIAGNOSIS — F22 Delusional disorders: Secondary | ICD-10-CM | POA: Diagnosis not present

## 2022-07-06 DIAGNOSIS — R410 Disorientation, unspecified: Secondary | ICD-10-CM | POA: Diagnosis not present

## 2022-07-06 DIAGNOSIS — C7A8 Other malignant neuroendocrine tumors: Secondary | ICD-10-CM | POA: Diagnosis not present

## 2022-07-06 DIAGNOSIS — Z7901 Long term (current) use of anticoagulants: Secondary | ICD-10-CM | POA: Diagnosis not present

## 2022-07-06 DIAGNOSIS — R251 Tremor, unspecified: Secondary | ICD-10-CM | POA: Diagnosis not present

## 2022-07-06 LAB — COMPREHENSIVE METABOLIC PANEL
ALT: 17 U/L (ref 0–44)
AST: 36 U/L (ref 15–41)
Albumin: 3.6 g/dL (ref 3.5–5.0)
Alkaline Phosphatase: 75 U/L (ref 38–126)
Anion gap: 7 (ref 5–15)
BUN: 28 mg/dL — ABNORMAL HIGH (ref 8–23)
CO2: 23 mmol/L (ref 22–32)
Calcium: 10.4 mg/dL — ABNORMAL HIGH (ref 8.9–10.3)
Chloride: 107 mmol/L (ref 98–111)
Creatinine, Ser: 1.08 mg/dL (ref 0.61–1.24)
GFR, Estimated: >60 mL/min (ref >60)
Glucose, Bld: 103 mg/dL — ABNORMAL HIGH (ref 70–99)
Potassium: 4 mmol/L (ref 3.5–5.1)
Sodium: 137 mmol/L (ref 135–145)
Total Bilirubin: 0.8 mg/dL (ref 0.3–1.2)
Total Protein: 7.9 g/dL (ref 6.5–8.1)

## 2022-07-06 LAB — CBC WITH DIFFERENTIAL/PLATELET
Abs Immature Granulocytes: 0.02 10*3/uL (ref 0.00–0.07)
Basophils Absolute: 0 10*3/uL (ref 0.0–0.1)
Basophils Relative: 1 %
Eosinophils Absolute: 0.1 10*3/uL (ref 0.0–0.5)
Eosinophils Relative: 2 %
HCT: 35.9 % — ABNORMAL LOW (ref 39.0–52.0)
Hemoglobin: 11.7 g/dL — ABNORMAL LOW (ref 13.0–17.0)
Immature Granulocytes: 0 %
Lymphocytes Relative: 12 %
Lymphs Abs: 0.6 10*3/uL — ABNORMAL LOW (ref 0.7–4.0)
MCH: 27.9 pg (ref 26.0–34.0)
MCHC: 32.6 g/dL (ref 30.0–36.0)
MCV: 85.7 fL (ref 80.0–100.0)
Monocytes Absolute: 0.6 10*3/uL (ref 0.1–1.0)
Monocytes Relative: 12 %
Neutro Abs: 4.1 10*3/uL (ref 1.7–7.7)
Neutrophils Relative %: 73 %
Platelets: 210 10*3/uL (ref 150–400)
RBC: 4.19 MIL/uL — ABNORMAL LOW (ref 4.22–5.81)
RDW: 15.1 % (ref 11.5–15.5)
WBC: 5.5 10*3/uL (ref 4.0–10.5)
nRBC: 0 % (ref 0.0–0.2)

## 2022-07-06 LAB — I-STAT VENOUS BLOOD GAS, ED
Acid-Base Excess: 0 mmol/L (ref 0.0–2.0)
Bicarbonate: 23.2 mmol/L (ref 20.0–28.0)
Calcium, Ion: 1.46 mmol/L — ABNORMAL HIGH (ref 1.15–1.40)
HCT: 34 % — ABNORMAL LOW (ref 39.0–52.0)
Hemoglobin: 11.6 g/dL — ABNORMAL LOW (ref 13.0–17.0)
O2 Saturation: 71 %
Patient temperature: 97.2
Potassium: 4.3 mmol/L (ref 3.5–5.1)
Sodium: 138 mmol/L (ref 135–145)
TCO2: 24 mmol/L (ref 22–32)
pCO2, Ven: 33.1 mmHg — ABNORMAL LOW (ref 44–60)
pH, Ven: 7.452 — ABNORMAL HIGH (ref 7.25–7.43)
pO2, Ven: 33 mmHg (ref 32–45)

## 2022-07-06 LAB — URINALYSIS, MICROSCOPIC (REFLEX): RBC / HPF: >50 RBC/hpf (ref 0–5)

## 2022-07-06 LAB — URINALYSIS, ROUTINE W REFLEX MICROSCOPIC
Glucose, UA: NEGATIVE mg/dL
Ketones, ur: NEGATIVE mg/dL
Leukocytes,Ua: NEGATIVE
Nitrite: NEGATIVE
Protein, ur: 30 mg/dL — AB
Specific Gravity, Urine: >=1.03 (ref 1.005–1.030)
pH: 5.5 (ref 5.0–8.0)

## 2022-07-06 LAB — AMMONIA: Ammonia: 11 umol/L (ref 9–35)

## 2022-07-06 MED ORDER — SODIUM CHLORIDE 0.9 % IV BOLUS
1000.0000 mL | Freq: Once | INTRAVENOUS | Status: AC
  Administered 2022-07-06: 1000 mL via INTRAVENOUS

## 2022-07-06 NOTE — ED Provider Notes (Signed)
[facility] Provider Note   CSN: [id number] Arrival date & time: 07/06/22  2019     History  Chief Complaint  Patient presents with   Delusional   Tremors    [name] is a 66 y.o. male.  66 yo M with a chief complaints of hallucinations.  Per the family this is just started the patient states that he is actually had symptoms since June.  They were told that they were going to get worse about this time a year.  They have tried different medications to help him sleep which they think could be part of the cause and he was recently started on propanolol to help with his tremors but they think maybe that also made his delusions worse.  They deny cough congestion or fever.  Feel he has not been drinking enough.  No obvious urinary symptoms.        Home Medications Prior to Admission medications   Medication Sig Start Date End Date Taking? Authorizing Provider  acetaminophen (TYLENOL) 325 MG tablet Take 1-2 tablets (325-650 mg total) by mouth every 4 (four) hours as needed for mild pain. 05/24/22   Setzer, Edman Circle, PA-C  benzonatate (TESSALON) 100 MG capsule Take 1 capsule (100 mg total) by mouth 3 (three) times daily as needed for cough. TAKE 1 CAPSULE(100 MG) BY MOUTH THREE TIMES DAILY AS NEEDED FOR COUGH Strength: 100 mg 06/07/22   Raulkar, Clide Deutscher, MD  bisacodyl (DULCOLAX) 5 MG EC tablet Take 1 tablet (5 mg total) by mouth daily as needed for moderate constipation. 05/24/22   Setzer, Edman Circle, PA-C  diclofenac Sodium (VOLTAREN) 1 % GEL Apply 2 g topically 4 (four) times daily. 05/24/22   Setzer, Edman Circle, PA-C  docusate sodium (COLACE) 100 MG capsule Take 1 capsule (100 mg total) by mouth 3 (three) times daily. 05/24/22   Setzer, Edman Circle, PA-C  escitalopram (LEXAPRO) 5 MG tablet Take 1 tablet (5 mg total) by mouth at bedtime. 05/24/22   Setzer, Edman Circle, PA-C  HYDROmorphone (DILAUDID) 8 MG tablet Take 1 tablet by mouth every 6 hours as needed for  severe pain. 06/28/22   Pickenpack-Cousar, Carlena Sax, NP  lidocaine-prilocaine (EMLA) cream Apply to the Port-A-Cath site 30-60-minute before treatment Patient taking differently: Apply 1 Application topically once as needed (prior to port access - 30-60 minutes before treatment). 11/09/21   Curt Bears, MD  magnesium gluconate (MAGONATE) 500 MG tablet Take 0.5 tablets (250 mg total) by mouth at bedtime. 05/24/22   Setzer, Edman Circle, PA-C  megestrol (MEGACE) 40 MG/ML suspension SHAKE LIQUID AND TAKE 10 ML(400 MG) BY MOUTH TWICE DAILY 06/29/22   Raulkar, Clide Deutscher, MD  meloxicam (MOBIC) 15 MG tablet Take 1 tablet (15 mg total) by mouth daily. 06/22/22   Raulkar, Clide Deutscher, MD  methocarbamol (ROBAXIN) 500 MG tablet Take 1 tablet (500 mg total) by mouth every 6 (six) hours as needed for muscle spasms. 06/07/22   Raulkar, Clide Deutscher, MD  midodrine (PROAMATINE) 10 MG tablet Take 1 tablet (10 mg total) by mouth 3 (three) times daily with meals. 07/02/22   Raulkar, Clide Deutscher, MD  morphine (MS CONTIN) 60 MG 12 hr tablet Take 1 tablet (60 mg total) by mouth every 12 (twelve) hours. 06/11/22   Pickenpack-Cousar, Carlena Sax, NP  naloxone (NARCAN) nasal spray 4 mg/0.1 mL Place 1 spray intranasally into 1 nostril. If desired response not obtained after 2 or 3 minutes, may repeat 1 spray into  other nostril 05/24/22   Setzer, Edman Circle, PA-C  ondansetron (ZOFRAN) 8 MG tablet Take 1 tablet by mouth every 8 hours as needed for nausea or vomiting. Starting 3 days after chemotherapy 01/11/22   Pickenpack-Cousar, Carlena Sax, NP  polyethylene glycol (MIRALAX / GLYCOLAX) 17 g packet Take 17 g by mouth daily. 05/25/22   Setzer, Edman Circle, PA-C  prochlorperazine (COMPAZINE) 10 MG tablet Take 10 mg by mouth every 6 (six) hours as needed for nausea or vomiting. 04/26/22   [provider]  propranolol (INDERAL) 10 MG tablet Take 1 tablet (10 mg total) by mouth 2 (two) times daily. 07/02/22   Raulkar, Clide Deutscher, MD  scopolamine  (TRANSDERM-SCOP) 1 MG/3DAYS Place 1 patch (1.5 mg total) onto the skin every 3 (three) days. 05/24/22   Setzer, Edman Circle, PA-C  traZODone (DESYREL) 50 MG tablet Take 1 tablet (50 mg total) by mouth at bedtime. 07/02/22   Raulkar, Clide Deutscher, MD  Vitamin D, Ergocalciferol, (DRISDOL) 1.25 MG (50000 UNIT) CAPS capsule Take 1 capsule (50,000 Units total) by mouth every 7 (seven) days. 06/22/22   Raulkar, Clide Deutscher, MD  XARELTO 20 MG TABS tablet Take 1 tablet (20 mg total) by mouth every evening. 04/14/22   [name], MD      Allergies    Prednisone, Tetanus toxoids, Wellbutrin [bupropion], Gabapentin, Morphine, Tetanus antitoxin, Varenicline tartrate, Lorazepam, and Varenicline    Review of Systems   Review of Systems  Physical Exam Updated Vital Signs BP 106/72   Pulse 90   Temp 98.2 F (36.8 C) (Oral)   Resp 14   Ht 5\' 9"  (1.753 m)   Wt 57.6 kg   SpO2 100%   BMI 18.75 kg/m  Physical Exam Vitals and nursing note reviewed.  Constitutional:      Appearance: He is well-developed.     Comments: Temporal wasting  HENT:     Head: Normocephalic and atraumatic.  Eyes:     Pupils: Pupils are equal, round, and reactive to light.  Neck:     Vascular: No JVD.  Cardiovascular:     Rate and Rhythm: Normal rate and regular rhythm.     Heart sounds: No murmur heard.    No friction rub. No gallop.  Pulmonary:     Effort: No respiratory distress.     Breath sounds: No wheezing.  Abdominal:     General: There is no distension.     Tenderness: There is no abdominal tenderness. There is no guarding or rebound.  Musculoskeletal:        General: Normal range of motion.     Cervical back: Normal range of motion and neck supple.     Comments: Coarse bilateral upper extremity tremor  Skin:    Coloration: Skin is not pale.     Findings: No rash.  Neurological:     Mental Status: He is alert and oriented to person, place, and time.  Psychiatric:        Behavior: Behavior normal.      ED Results / Procedures / Treatments   Labs (all labs ordered are listed, but only abnormal results are displayed) Labs Reviewed  CBC WITH DIFFERENTIAL/PLATELET - Abnormal; Notable for the following components:      Result Value   RBC 4.19 (*)    Hemoglobin 11.7 (*)    HCT 35.9 (*)    Lymphs Abs 0.6 (*)    All other components within normal limits  COMPREHENSIVE METABOLIC PANEL - Abnormal;  Notable for the following components:   Glucose, Bld 103 (*)    BUN 28 (*)    Calcium 10.4 (*)    All other components within normal limits  URINALYSIS, ROUTINE W REFLEX MICROSCOPIC - Abnormal; Notable for the following components:   APPearance CLOUDY (*)    Hgb urine dipstick LARGE (*)    Bilirubin Urine SMALL (*)    Protein, ur 30 (*)    All other components within normal limits  URINALYSIS, MICROSCOPIC (REFLEX) - Abnormal; Notable for the following components:   Bacteria, UA RARE (*)    All other components within normal limits  I-STAT VENOUS BLOOD GAS, ED - Abnormal; Notable for the following components:   pH, Ven 7.452 (*)    pCO2, Ven 33.1 (*)    Calcium, Ion 1.46 (*)    HCT 34.0 (*)    Hemoglobin 11.6 (*)    All other components within normal limits  AMMONIA  BLOOD GAS, VENOUS    EKG None  Radiology CT Head Wo Contrast  Result Date: 07/06/2022 CLINICAL DATA:  Altered mental status. EXAM: CT HEAD WITHOUT CONTRAST TECHNIQUE: Contiguous axial images were obtained from the base of the skull through the vertex without intravenous contrast. RADIATION DOSE REDUCTION: This exam was performed according to the departmental dose-optimization program which includes automated exposure control, adjustment of the mA and/or kV according to patient size and/or use of iterative reconstruction technique. COMPARISON:  May 04, 2022 FINDINGS: Brain: There is mild cerebral atrophy with widening of the extra-axial spaces and ventricular dilatation. There are areas of decreased attenuation  within the white matter tracts of the supratentorial brain, consistent with microvascular disease changes. Vascular: No hyperdense vessel or unexpected calcification. Skull: Normal. Negative for fracture or focal lesion. Sinuses/Orbits: There is mild bilateral maxillary sinus mucosal thickening. Other: None. IMPRESSION: 1. No acute intracranial abnormality. 2. Generalized cerebral atrophy and microvascular disease changes of the supratentorial brain. 3. Mild bilateral maxillary sinus disease. Electronically Signed   By: Virgina Norfolk M.D.   On: 07/06/2022 21:39   DG Chest Port 1 View  Result Date: 07/06/2022 CLINICAL DATA:  Altered mental status. Patient reports right-sided rib pain, chronic. History of lung cancer with metastasis. EXAM: PORTABLE CHEST 1 VIEW COMPARISON:  Radiograph and CT 05/04/2022 FINDINGS: Right chest port remains in place. Post median sternotomy. Chronic volume loss in the right hemithorax. There is increasing right pleural effusion with progressive haziness in the mid lower right hemithorax. Multiple bilateral pulmonary nodules consistent with known metastatic disease, not well assessed by radiograph. Right rib irregularity on prior CT is obscured on the current exam. There is no obvious acute fracture. IMPRESSION: 1. Increasing right pleural effusion with progressive haziness in the mid lower right hemithorax. 2. Bilateral pulmonary nodules consistent with known metastatic disease, not well assessed by radiograph. Electronically Signed   By: Keith Rake M.D.   On: 07/06/2022 21:34    Procedures Procedures    Medications Ordered in ED Medications  sodium chloride 0.9 % bolus 1,000 mL (1,000 mLs Intravenous New Bag/Given 07/06/22 2142)    ED Course/ Medical Decision Making/ A&P                           Medical Decision Making Amount and/or Complexity of Data Reviewed Labs: ordered. Radiology: ordered. ECG/medicine tests: ordered.   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  Here with intermittent hallucinations.  He just had MRI imaging done through the [facility] system and had a stereotactic radiosurgery on 10/27.  On my record review it seems that the patient has had hallucinations for at least the past month if not longer.  Will evaluate here in the ER for possible causes, CT scan of the head blood work chest x-ray UA.  Bolus of IV fluids for possible dehydration.  Patient with no significant change on repeat assessment.  UA negative for infection chest x-ray independently interpreted by me without obvious change from prior.  Radiology read with increasing right-sided pleural effusion.  CT of the head without obvious acute pathology.  Not hypercarbic renal function at baseline no anemia.  I discussed the results with the patient and family.  We will have them follow-up with her family doctor in the office.  11:13 PM:  I have discussed the diagnosis/risks/treatment options with the patient and family.  Evaluation and diagnostic testing in the emergency department does not suggest an emergent condition requiring admission or immediate intervention beyond what has been performed at this time.  They will follow up with PCP. We also discussed returning to the ED immediately if new or worsening sx occur. We discussed the sx which are most concerning (e.g., sudden worsening pain, fever, inability to tolerate by mouth) that necessitate immediate return. Medications administered to the patient during their visit and any new prescriptions provided to the patient are listed below.  Medications given during this visit Medications  sodium chloride 0.9 % bolus 1,000 mL (1,000 mLs Intravenous New Bag/Given 07/06/22 2142)     The patient appears reasonably screen and/or stabilized for discharge and I doubt any other medical condition or other [facility] requiring further screening, evaluation, or treatment in the ED at  this time prior to discharge.          Final Clinical Impression(s) / ED Diagnoses Final diagnoses:  Confusion    Rx / DC Orders ED Discharge Orders     None         Deno Etienne, DO 07/06/22 2313

## 2022-07-06 NOTE — Discharge Instructions (Addendum)
Call your doctors on the office. Return for worsening symptoms, fever, inability to eat or drink.

## 2022-07-06 NOTE — ED Triage Notes (Signed)
Family reports new onset delirium and tremors x 2 weeks. Worsened today.   Also c/o right sided rib pain ("from cancer") that is chronic.   Hx of lung cancer with mets. Brain radiation tx on Friday. Treatment is through [facility].

## 2022-07-07 ENCOUNTER — Telehealth: Payer: Self-pay

## 2022-07-07 ENCOUNTER — Encounter (HOSPITAL_BASED_OUTPATIENT_CLINIC_OR_DEPARTMENT_OTHER): Payer: Medicare Other | Admitting: Physical Medicine and Rehabilitation

## 2022-07-07 DIAGNOSIS — R479 Unspecified speech disturbances: Secondary | ICD-10-CM

## 2022-07-07 NOTE — Telephone Encounter (Signed)
April came to me about this patient and stating that his speech was slurred. She states she spoke to Dr. Ranell Patrick and Dr. Ranell Patrick said to have his wife take him to the ED. He said his wife was out to lunch.  I called his wife and she stated to me that she took him to the ED last night [facility] ED because speech slurred and patient has been rude, demanding, mean to her and the [facility] therapy. ED said he was fine and to f/u with Neurology. She states neurology appt is not until 07/27/22.

## 2022-07-07 NOTE — Telephone Encounter (Signed)
Please call [name] back to discuss the in-home referral location, for speech.   She also stated during the visit you mention another referral for Psych.   Call back phone [phone].

## 2022-07-07 NOTE — Progress Notes (Signed)
Subjective:    Patient ID: [name], male    DOB: 1955/12/10, 66 y.o.   MRN: [id number]  HPI An audio/video tele-health visit is felt to be the most appropriate encounter for this patient at this time. This is a follow up tele-visit via phone. The patient is at home. MD is at office. Prior to scheduling this appointment, our staff discussed the limitations of evaluation and management by telemedicine and the availability of in-person appointments. The patient expressed understanding and agreed to proceed.    Mr. Rickman is 66 year old man who presents for f/u of debility and impaired cognition.   1) tremor -he is unable to type -has worsened at home. -his wife would like for him to try propanolol -he discussed with his oncologist as well -continues to be severe -wife would like for Propanolol to be increased to BID  2) dizziness -BP drops with standing  3) pain from metastases -he is taking meloxicam daily -he is not using the voltaren gel as frequently.   4) decreased appetite -wife reports a 10 lb weight loss that was very rapid -she feels he needs a medication to help him eat better  5) Nausea -nausea has currently worsened  -he has been taking compazine and zofran without any improvement -he has not been using scopolamine patch but has some at home -his last BM was the day before yesterday -he was "backed up" earlier this week.   6) Insomnia: -has not been sleeping well  7) Impaired cognition -had follow-up with oncology today -he would be interested in seeing a neuropsychologist  8) Impaired speech -patient called to report worsening speech -said his wife advised him to go to the ED yesterday because of confusion -he was referred to outpatient neurology -his wife asks if he can get at-home SLP    Review of Systems +pain from metastases +dizziness with standing +tremor   +nausea +constipation Objective:   Physical Exam Not performed       Assessment & Plan:  1) Debility -continue handicap placard -continue home therapies -continue cane  2) Orthostatic hypotension -used teds and abdominal binder   3) Pain from metastases: Continue meloxicam prn -continue voltaren gel 4 times per day -discussed ketogenic diet -continue muscle relaxer -prescribed Zynex cold therapy blanket  4) Abdominal pain possibly secondary to constipation -mag citrate ordered -recommended eating 1-3 apples per day -Provided list of following foods that help with constipation and highlighted a few: 1) prunes- contain high amounts of fiber.  2) apples- has a form of dietary fiber called pectin that accelerates stool movement and increases beneficial gut bacteria 3) pears- in addition to fiber, also high in fructose and sorbitol which have laxative effect 4) figs- contain an enzyme ficin which helps to speed colonic transit 5) kiwis- contain an enzyme actinidin that improves gut motility and reduces constipation 6) oranges- rich in pectin (like apples) 7) grapefruits- contain a flavanol naringenin which has a laxative effect 8) vegetables- rich in fiber and also great sources of folate, vitamin C, and K 9) artichoke- high in inulin, prebiotic great for the microbiome 10) chicory- increases stool frequency and softness (can be added to coffee) 11) rhubarb- laxative effect 12) sweet potato- high fiber 13) beans, peas, and lentils- contain both soluble and insoluble fiber 14) chia seeds- improves intestinal health and gut flora 15) flaxseeds- laxative effect 16) whole grain rye bread- high in fiber 17) oat bran- high in soluble and insoluble fiber 18) kefir- softens  stools -recommended to try at least one of these foods every day.  -drink 6-8 glasses of water per day -walk regularly, especially after meals.   5) Nausea -discussed that this could be secondary to constipation -discussed that compazine and zofran can cause  constipation -recommended ginger -recommended high fiber foods to help with constipation, or trying the mag citrate to clear him out  6) Decreased appetite: -prescribed megace 400mg  BID  7) Impaired cognition: -referred to neuropsychology  8) Insomnia -start trazodone 50mg  HS  9) Acutely worsening speech -recommended going to the ED due to concern for stroke. -contacted his inpatient social worker regarding getting him home SLP  5 minutes spent in discussion of his impaired speech, recommended going to ED again if speech is worsening due to concerns for stroke

## 2022-07-08 NOTE — Telephone Encounter (Signed)
Mrs. Fisch will call back with an Agency name to send the referral.

## 2022-07-12 ENCOUNTER — Encounter: Payer: Self-pay | Admitting: Physical Medicine and Rehabilitation

## 2022-07-12 ENCOUNTER — Encounter (HOSPITAL_BASED_OUTPATIENT_CLINIC_OR_DEPARTMENT_OTHER): Payer: Medicare Other | Admitting: Physical Medicine and Rehabilitation

## 2022-07-12 VITALS — BP 96/69 | HR 108 | Ht 69.0 in | Wt 127.0 lb

## 2022-07-12 DIAGNOSIS — C7951 Secondary malignant neoplasm of bone: Secondary | ICD-10-CM

## 2022-07-12 DIAGNOSIS — G8929 Other chronic pain: Secondary | ICD-10-CM

## 2022-07-12 DIAGNOSIS — R479 Unspecified speech disturbances: Secondary | ICD-10-CM | POA: Diagnosis not present

## 2022-07-12 DIAGNOSIS — R251 Tremor, unspecified: Secondary | ICD-10-CM | POA: Diagnosis present

## 2022-07-12 DIAGNOSIS — Z789 Other specified health status: Secondary | ICD-10-CM

## 2022-07-12 DIAGNOSIS — M546 Pain in thoracic spine: Secondary | ICD-10-CM | POA: Diagnosis not present

## 2022-07-12 DIAGNOSIS — G893 Neoplasm related pain (acute) (chronic): Secondary | ICD-10-CM

## 2022-07-12 DIAGNOSIS — G4701 Insomnia due to medical condition: Secondary | ICD-10-CM

## 2022-07-12 DIAGNOSIS — R413 Other amnesia: Secondary | ICD-10-CM | POA: Diagnosis not present

## 2022-07-12 MED ORDER — BISACODYL 5 MG PO TBEC: 5.0000 mg | DELAYED_RELEASE_TABLET | Freq: Every day | ORAL | 3 refills | Status: AC | PRN

## 2022-07-12 MED ORDER — MAGNESIUM CITRATE PO SOLN: 1.0000 | Freq: Once | ORAL | 3 refills | Status: AC

## 2022-07-12 MED ORDER — PRIMIDONE 50 MG PO TABS: 50.0000 mg | ORAL_TABLET | Freq: Every day | ORAL | 3 refills | Status: AC

## 2022-07-12 NOTE — Patient Instructions (Addendum)
Tart cherry juice  Pistachios  Insomnia: -Try to go outside near sunrise -Get exercise during the day.  -Turn off all devices an hour before bedtime.  -Teas that can benefit: chamomile, valerian root, Brahmi (Bacopa) -Can consider over the counter melatonin, magnesium, and/or L-theanine. Melatonin is an anti-oxidant with multiple health benefits. Magnesium is involved in greater than 300 enzymatic reactions in the body and most of [name] are deficient as our soil is often depleted. There are 7 different types of magnesium- Bioptemizer's is a supplement with all 7 types, and each has unique benefits. Magnesium can also help with constipation and anxiety.  -Pistachios naturally increase the production of melatonin -Cozy Earth bamboo bed sheets are free from toxic chemicals.  -Tart cherry juice or a tart cherry supplement can improve sleep and soreness post-workout

## 2022-07-12 NOTE — Progress Notes (Signed)
Subjective:    Patient ID: [name], male    DOB: Jan 10, 1956, 66 y.o.   MRN: [id number]  HPI  [name] is 66 year old man who presents for f/u of debility and impaired cognition.   1) tremor -he is unable to type -getting worse -propanolol was causing severe delirium  -wife is asking when this could be worsening.  -has worsened at home. -his wife would like for him to try propanolol -he discussed with his oncologist as well -continues to be severe -wife would like for Propanolol to be increased to BID  2) dizziness -BP drops with standing  3) pain from metastases -he is taking meloxicam daily -he is not using the voltaren gel as frequently.   4) decreased appetite -wife reports a 10 lb weight loss that was very rapid -she feels he needs a medication to help him eat better  5) Nausea -nausea has currently worsened  -he has been taking compazine and zofran without any improvement -he has not been using scopolamine patch but has some at home -his last BM was the day before yesterday -he was "backed up" earlier this week.   6) Insomnia: -has not been sleeping well  7) Impaired cognition -had follow-up with oncology today -he would be interested in seeing a neuropsychologist  8) Impaired speech -patient called to report worsening speech -said his wife advised him to go to the ED yesterday because of confusion -he was referred to outpatient neurology -wife was unaware that he would be getting outpatient PT and OT therapy -she and he are willing for him to get SLP  9) Back pain -has not been using voltaren gel    Review of Systems +pain from metastases +dizziness with standing +tremor   +nausea +constipation Objective:   Physical Exam Not performed      Assessment & Plan:  1) Debility -continue handicap placard -continue home therapies -continue cane  2) Orthostatic hypotension -used teds and abdominal binder   3) Pain from  metastases: Continue meloxicam prn -continue voltaren gel 4 times per day -discussed ketogenic diet -continue muscle relaxer -prescribed Zynex cold therapy blanket  4) Abdominal pain possibly secondary to constipation -mag citrate ordered -dulcolax ordered -recommended eating 1-3 apples per day -Provided list of following foods that help with constipation and highlighted a few: 1) prunes- contain high amounts of fiber.  2) apples- has a form of dietary fiber called pectin that accelerates stool movement and increases beneficial gut bacteria 3) pears- in addition to fiber, also high in fructose and sorbitol which have laxative effect 4) figs- contain an enzyme ficin which helps to speed colonic transit 5) kiwis- contain an enzyme actinidin that improves gut motility and reduces constipation 6) oranges- rich in pectin (like apples) 7) grapefruits- contain a flavanol naringenin which has a laxative effect 8) vegetables- rich in fiber and also great sources of folate, vitamin C, and K 9) artichoke- high in inulin, prebiotic great for the microbiome 10) chicory- increases stool frequency and softness (can be added to coffee) 11) rhubarb- laxative effect 12) sweet potato- high fiber 13) beans, peas, and lentils- contain both soluble and insoluble fiber 14) chia seeds- improves intestinal health and gut flora 15) flaxseeds- laxative effect 16) whole grain rye bread- high in fiber 17) oat bran- high in soluble and insoluble fiber 18) kefir- softens stools -recommended to try at least one of these foods every day.  -drink 6-8 glasses of water per day -walk regularly, especially after  meals.   5) Nausea -discussed that this could be secondary to constipation -discussed that compazine and zofran can cause constipation -recommended ginger -recommended high fiber foods to help with constipation, or trying the mag citrate to clear him out  6) Decreased appetite: -continue megace 400mg   BID  7) Impaired cognition: -referred to neuropsychology  8) Insomnia -start trazodone 50mg  HS -discussed that he can take 2 trazodone  Insomnia: -Try to go outside near sunrise -Get exercise during the day.  -Turn off all devices an hour before bedtime.  -Teas that can benefit: chamomile, valerian root, Brahmi (Bacopa) -Can consider over the counter melatonin, magnesium, and/or L-theanine. Melatonin is an anti-oxidant with multiple health benefits. Magnesium is involved in greater than 300 enzymatic reactions in the body and most of [name] are deficient as our soil is often depleted. There are 7 different types of magnesium- Bioptemizer's is a supplement with all 7 types, and each has unique benefits. Magnesium can also help with constipation and anxiety.  -Pistachios naturally increase the production of melatonin -Cozy Earth bamboo bed sheets are free from toxic chemicals.  -Tart cherry juice or a tart cherry supplement can improve sleep and soreness post-workout    9) Acutely worsening speech -recommended going to the ED due to concern for stroke last week -contacted his inpatient social worker regarding getting him home SLP but this is not likely given he was recently graduated to outpatient PT and OT -referred to outpatient SLP  10) Pain from metastases: -referred to PT

## 2022-07-13 NOTE — Telephone Encounter (Signed)
Patient had an office visit. Task completed.

## 2022-07-14 ENCOUNTER — Other Ambulatory Visit (HOSPITAL_COMMUNITY): Payer: Self-pay

## 2022-07-14 ENCOUNTER — Other Ambulatory Visit: Payer: Self-pay | Admitting: Nurse Practitioner

## 2022-07-14 DIAGNOSIS — G893 Neoplasm related pain (acute) (chronic): Secondary | ICD-10-CM

## 2022-07-14 DIAGNOSIS — C7A1 Malignant poorly differentiated neuroendocrine tumors: Secondary | ICD-10-CM

## 2022-07-14 DIAGNOSIS — Z515 Encounter for palliative care: Secondary | ICD-10-CM

## 2022-07-14 MED ORDER — HYDROMORPHONE HCL 8 MG PO TABS
8.0000 mg | ORAL_TABLET | Freq: Four times a day (QID) | ORAL | 0 refills | Status: DC | PRN
  Filled 2022-07-14: qty 60, 15d supply, fill #0

## 2022-07-14 MED ORDER — MORPHINE SULFATE ER 60 MG PO TBCR
60.0000 mg | EXTENDED_RELEASE_TABLET | Freq: Two times a day (BID) | ORAL | 0 refills | Status: DC
  Filled 2022-07-14: qty 60, 30d supply, fill #0

## 2022-07-21 ENCOUNTER — Encounter: Payer: Self-pay | Admitting: Psychology

## 2022-07-21 ENCOUNTER — Encounter (HOSPITAL_BASED_OUTPATIENT_CLINIC_OR_DEPARTMENT_OTHER): Payer: Medicare Other | Admitting: Psychology

## 2022-07-21 DIAGNOSIS — G8929 Other chronic pain: Secondary | ICD-10-CM

## 2022-07-21 DIAGNOSIS — M546 Pain in thoracic spine: Secondary | ICD-10-CM | POA: Diagnosis not present

## 2022-07-21 DIAGNOSIS — G4701 Insomnia due to medical condition: Secondary | ICD-10-CM | POA: Diagnosis not present

## 2022-07-21 DIAGNOSIS — R413 Other amnesia: Secondary | ICD-10-CM | POA: Diagnosis not present

## 2022-07-21 DIAGNOSIS — R251 Tremor, unspecified: Secondary | ICD-10-CM

## 2022-07-21 NOTE — Progress Notes (Signed)
Neuropsychological Consultation   Patient:   [name]   DOB:   May 21, 1956  MR Number:  [id number]  Location:  [facility] [place], [place] [id number] [address] [place] [address] Dept: [phone]           Date of Service:   07/21/2022  Start Time:   8 AM End Time:   10 AM  Today's visit was an in person visit that was conducted in my outpatient clinic office.  The patient, [name] and myself were present for this visit.  1 hour and 15 minutes was spent in face-to-face clinical interview and the other 45 minutes was spent with records review, report writing and setting up testing protocols.  Provider/Observer:  Ilean Skill, Psy.D.       Clinical Neuropsychologist       Billing Code/Service: 96116/96121  Chief Complaint:    [name] is a 66 year old male referred for neuropsychological consultation by Dr. Ranell Patrick who is his treating physiatrist.  The patient is also being followed by a number of other doctors including oncology.  The patient was referred for neuropsychological consultation due to ongoing issues with cognitive decline including memory difficulties and changes in executive functioning as well as the development of tremors.  The patient has also had acute onset episodes of confusion and delirium at times.  Changes in cognitive functioning have been rather rapid at times.  The patient has been dealing with metastatic cancer and they have employed a number of different oncology strategies.  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  Patient has had brain radiation and chemotherapy.  The patient and his wife describe a gradual decline especially since his whole brain  radiation.  Reason for Service:  [name] is a 66 year old male referred for neuropsychological consultation by Dr. Ranell Patrick who is his treating physiatrist.  The patient is also being followed by a number of other doctors including oncology.  The patient was referred for neuropsychological consultation due to ongoing issues with cognitive decline including memory difficulties and changes in executive functioning as well as the development of tremors.  The patient has also had acute onset episodes of confusion and delirium at times.  Changes in cognitive functioning have been rather rapid at times.  The patient has been dealing with metastatic cancer and they have employed a number of different oncology strategies.  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  Patient has had brain radiation and chemotherapy.  The patient and his wife describe a gradual decline especially since his whole brain radiation.  One of the concerns today had to do with understanding the etiology of his tremor.  Tremors are bilateral and have a waxing and waning nature to them.  Left hand tremor greater than right.  No tremor noted in head.  Of note, the patient's father is described as having developed tremor around age 12 and persisted throughout his adult life.  The patient died of cardiovascular issues and 53.  Descriptions of his father's tremors and course some much more consistent with essential tremor rather than parkinsonian  type conditions.  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  Questions about his direct exposure to solvents and oil-based paints suggested minimal exposure to solvents as he was not in the application part of the business.  No other specific toxic exposures were noted.  The  patient reports that his tremors have been somewhat improved with current medication midodrine and that propranolol did not help particularly well with tremors.  Left hand tremor greater than right hand tremor.  Patient is left-hand dominant.  Patient's tremors did develop after he began cancer treatment.  Besides changes and worsening of memory functions there are also difficulties with executive function and problem-solving.  Expressive language including forming words and constructing sentences have been impacted and the patient has slowed information processing speed.  The patient had whole brain radiation in June 2023.  This was due to brain mets from metastasis of lung cancer.  The patient was hospitalized on the comprehensive inpatient rehabilitation unit in September 2023 and I saw him there on 1 occasion for consultation.  Other major medical events include a heart attack in 2017 and having his right lung lobe removed in March 2022.  The patient denies any significant depressive symptoms and given the circumstances he has been managing fairly well.  The patient describes some sleep disturbance and will be up in the middle the night but he sleeps better now that he is taking trazodone and melatonin at night.  The patient describes poor appetite.  The patient has a lot of difficulties with pain some which could be directly attributed to previous surgeries related to his lung cancer.  He is taking numerous medications including periodic muscle relaxers as well as opioid-based pain medications.  Behavioral Observation: [name]  presents as a 66 y.o.-year-old Left handed Caucasian Male who appeared his stated age. his dress was Appropriate and he was Well Groomed and his manners were Appropriate to the situation.  his participation was indicative of Drowsy and Inattentive behaviors.  There were physical disabilities noted.  he displayed an appropriate level of cooperation and motivation.   Tremor was noted in both hands but mild.  No head tremor was noted.  Patient was weak using a wide-based cane for ambulation and had very slow motor functions.   Interactions:    Active Drowsy and Redirectable  Attention:   abnormal and attention span appeared shorter than expected for age  Memory:   abnormal; remote memory intact, recent memory impaired  Visuo-spatial:  not examined  Speech (Volume):  low  Speech:   normal; slowed speech pattern with some word finding difficulties noted  Thought Process:  Coherent and Relevant  Though Content:  WNL; not suicidal and not homicidal  Orientation:   person, place, time/date, and situation  Judgment:   Fair  Planning:   Poor  Affect:    Flat and Lethargic  Mood:    Dysphoric  Insight:   Fair  Intelligence:   high  Marital Status/Living: Patient was born and raised in [place] along with 2 siblings.  Patient is married to his wife of 62 years [name]).  Patient has had no previous marriages.  They have 3 adult daughters ages 77, 43 and 10 with no particularly difficulties.  Current Employment: The patient is retired.  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  Hobbies and interest have included reading, spending time with family.  Substance Use:  No concerns of substance abuse are reported.    Education:   The patient completed college receiving his bachelor's degree attending [employer] at [place].  Medical History:   Past Medical History:  Diagnosis Date   Anemia    Anxiety    Arthritis    Basal cell carcinoma (BCC) of forehead    CAD (coronary artery disease)    a. 10/2015 ant STEMI >> LHC with 3 v CAD; oLAD tx with POBA >> emergent CABG. b. Multiple evals since that time, early graft failure of SVG-RCA by cath 03/2016. c. 2/19 PCI/DES x1 to pRCA, normal EF.   Carotid artery disease ([place])    a. 40-59% BICA 02/2018.   Depression    Dyspnea     Ectopic atrial tachycardia    Esophageal reflux    eosinophil esophagitis   Family history of adverse reaction to anesthesia    "sister has PONV" (06/21/2017)   Former tobacco use    Gout    Hepatitis C    "treated and cured" (06/21/2017)   High cholesterol    History of blood transfusion    History of kidney stones    Hypertension    Ischemic cardiomyopathy    a. EF 25-30% at intraop TEE 4/17  //  b. Limited Echo 5/17 - EF 45-50%, mild ant HK. c. EF 55-65% by cath 09/2017.   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[place])    Migraine    "3-4/yr" (06/21/2017)   Myocardial infarction ([place]) 10/2015   Palpitations    Pneumonia    Sinus bradycardia    a. HR dropping into 40s in 02/2016 -> BB reduced.   Stroke [facility]) 10/2016   "small one; sometimes my memory/cognitive issues" (06/21/2017)   Symptomatic hypotension    a. 02/2016 ER visit -> meds reduced.   Syncope    Wears dentures    Wears glasses          Patient Active Problem List   Diagnosis Date Noted   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[place])    Hypotension, postural 05/13/2022   Orthostatic hypotension    Generalized weakness 05/04/2022   Falls 05/04/2022   Spells of trembling 11/94/1740   Acute metabolic encephalopathy 81/44/8185   AMS (altered mental status) 03/14/2022   Blurry vision, bilateral 10/08/2021   Leukopenia 10/08/2021   Wound of back 10/06/2021   Hyponatremia 10/03/2021   Cancer-related breakthrough pain 10/02/2021   Encounter for antineoplastic immunotherapy 09/29/2021   Cancer associated pain 09/15/2021   Constipation 09/15/2021   Right-sided chest wall pain 08/29/2021   Malnutrition of moderate degree 08/20/2021   Atrial tachycardia 08/18/2021   Lung cancer ([place]) 07/29/2021   Hemoptysis 06/06/2021   History of pulmonary embolus (PE) 04/29/2021   Normocytic anemia 04/29/2021   Hypotension due to hypovolemia 04/28/2021   Hypomagnesemia 04/08/2021   Port-A-Cath in place 04/07/2021    Protein-calorie malnutrition, moderate ([place]) 03/25/2021   PNA (pneumonia) 03/22/2021   Community acquired pneumonia 03/22/2021   Sepsis ([place]) 03/22/2021   CKD (chronic kidney disease), stage III ([place]) 03/22/2021   Hypoxia 02/28/2021   Dyspnea 02/28/2021   Acute renal failure superimposed on stage 3a chronic kidney disease ([place]) 02/25/2021   AF (paroxysmal atrial fibrillation) ([place]) 02/25/2021   Pleural effusion 02/25/2021   Neutropenia, drug-induced ([place]) 02/24/2021   Positive blood cultures 02/17/2021   Positive blood culture 02/16/2021   Right flank pain 02/09/2021   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[place]) 01/30/2021   Encounter for antineoplastic chemotherapy  01/29/2021   Goals of care, counseling/discussion 01/14/2021   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[place]) 01/14/2021   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[place]) 01/14/2021   Acute back pain with sciatica 01/12/2021   Allergic rhinitis 01/12/2021   Amnesia 01/12/2021   Kidney stone 01/12/2021   Sciatica 01/12/2021   Bipolar disorder ([place]) 01/12/2021   S/P lobectomy of lung 12/24/2020   Solitary lung nodule 11/28/2020   Chest pain 11/07/2019   Gastroesophageal reflux disease    Cervical radiculopathy 10/17/2018   Preoperative clearance 10/04/2018   Palpitations    Coronary artery disease involving coronary bypass graft of native heart with angina pectoris ([place])    Transient loss of consciousness 03/24/2018   Ectopic atrial tachycardia 02/09/2018   Central chest pain 03/10/2017   Family hx-stroke 11/10/2016   Stroke-like episode ([place]) - R brain, s/p tPA 11/09/2016   Unstable angina ([place]) 09/07/2016   Claudication of both lower extremities ([place])    Pure hypercholesterolemia    Tobacco abuse disorder    CAD of autologous artery bypass graft without angina    Chest pain at rest 06/10/2016   Abnormal nuclear stress test - HIGH RISK 04/20/2016   Old MI (myocardial infarction)    Essential hypertension  02/26/2016   Ischemic cardiomyopathy 12/25/2015   Hyperlipidemia LDL goal <70 12/25/2015   Mild tobacco abuse in early remission 11/28/2015   Coronary artery disease involving native coronary artery of native heart with angina pectoris ([place]) 11/28/2015   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   Acute MI anterior wall first episode care [facility])    Precordial chest pain 03/07/2015   Gout attack 03/07/2015   Mixed bipolar I disorder ([place]) 03/07/2015   Fibromyalgia 07/09/2014   Gout 07/09/2014   Anxiety 07/09/2014   Depression 07/09/2014   Hepatitis C 85/88/5027   Eosinophilic esophagitis 74/07/8785         Psychiatric History:  The patient was diagnosed with bipolar disorder in the past but both his wife and the patient denies any significant changes in mood state and that his mood is generally stable but dysphoric.  Family Med/Psych History:  Family History  Problem Relation Age of Onset   Lung cancer Mother    Heart Problems Father    Heart attack Father 42   Stroke Father    Heart failure Father    Heart attack Maternal Grandmother    Stroke Maternal Grandmother    Heart attack Paternal Uncle    Hypertension Brother    Autoimmune disease Neg Hx      Impression/DX:  [name] is a 66 year old male referred for neuropsychological consultation by Dr. Ranell Patrick who is his treating physiatrist.  The patient is also being followed by a number of other doctors including oncology.  The patient was referred for neuropsychological consultation due to ongoing issues with cognitive decline including memory difficulties and changes in executive functioning as well as the development of tremors.  The patient has also had acute onset episodes of confusion and delirium at times.  Changes in cognitive functioning have been rather rapid at times.  The patient has been dealing with metastatic cancer and they have employed a number of different oncology strategies.  Patient has had large cell neuroendocrine  carcinoma diagnosed in November 2022.  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  Patient has had brain radiation and chemotherapy.  The patient and his wife  describe a gradual decline especially since his whole brain radiation.  Disposition/Plan:  We have set the patient up for formal neuropsychological testing that will include the RBANS, COWAT, grooved pegboard, finger tapping measures and grip strength measures.  Once this formal assessment is completed I will complete a diagnostic clinical/neuropsychological report and sit down with the patient and his wife and go over the results and address diagnostic considerations as well as issues related to recommendations going forward.  This evaluation will be also made available to his referring physician and be made available in his EMR.  Diagnosis:    Impaired memory  Tremors of nervous system  Insomnia due to medical condition  Chronic thoracic back pain, unspecified back pain laterality         Electronically Signed   _______________________ Ilean Skill, Psy.D. Clinical Neuropsychologist

## 2022-07-26 ENCOUNTER — Telehealth: Payer: Self-pay

## 2022-07-26 NOTE — Telephone Encounter (Signed)
Pt wife called this AM regarding O2 order, this RN f/u with [facility] and was able to get O2 delivered to the home today. Called wife back to confirm she had everything pt needed. No further needs or questions at this time.

## 2022-07-27 ENCOUNTER — Encounter: Payer: Self-pay | Admitting: Diagnostic Neuroimaging

## 2022-07-27 ENCOUNTER — Encounter: Payer: Self-pay | Admitting: Nurse Practitioner

## 2022-07-27 ENCOUNTER — Other Ambulatory Visit: Payer: Self-pay | Admitting: Physical Medicine and Rehabilitation

## 2022-07-27 ENCOUNTER — Encounter: Payer: Self-pay | Admitting: Physician Assistant

## 2022-07-27 ENCOUNTER — Ambulatory Visit (INDEPENDENT_AMBULATORY_CARE_PROVIDER_SITE_OTHER): Payer: Medicare Other | Admitting: Diagnostic Neuroimaging

## 2022-07-27 ENCOUNTER — Inpatient Hospital Stay (HOSPITAL_BASED_OUTPATIENT_CLINIC_OR_DEPARTMENT_OTHER): Payer: Medicare Other | Admitting: Nurse Practitioner

## 2022-07-27 VITALS — BP 112/62 | HR 125 | Ht 69.0 in | Wt 126.0 lb

## 2022-07-27 DIAGNOSIS — C7A8 Other malignant neuroendocrine tumors: Secondary | ICD-10-CM | POA: Diagnosis not present

## 2022-07-27 DIAGNOSIS — R059 Cough, unspecified: Secondary | ICD-10-CM

## 2022-07-27 DIAGNOSIS — R531 Weakness: Secondary | ICD-10-CM | POA: Diagnosis not present

## 2022-07-27 DIAGNOSIS — R630 Anorexia: Secondary | ICD-10-CM

## 2022-07-27 DIAGNOSIS — I25119 Atherosclerotic heart disease of native coronary artery with unspecified angina pectoris: Secondary | ICD-10-CM | POA: Diagnosis not present

## 2022-07-27 DIAGNOSIS — Z515 Encounter for palliative care: Secondary | ICD-10-CM | POA: Diagnosis not present

## 2022-07-27 DIAGNOSIS — R413 Other amnesia: Secondary | ICD-10-CM | POA: Diagnosis not present

## 2022-07-27 DIAGNOSIS — Z7189 Other specified counseling: Secondary | ICD-10-CM | POA: Diagnosis not present

## 2022-07-27 DIAGNOSIS — G4709 Other insomnia: Secondary | ICD-10-CM

## 2022-07-27 DIAGNOSIS — K5900 Constipation, unspecified: Secondary | ICD-10-CM

## 2022-07-27 DIAGNOSIS — G893 Neoplasm related pain (acute) (chronic): Secondary | ICD-10-CM

## 2022-07-27 NOTE — Progress Notes (Signed)
[place]  [phone] [phone]   Name: [name] Date: 07/27/2022 MRN: [id number]  DOB: [birth year]  Patient Care Team: [name], MD as PCP - General (Family Medicine) [name], MD as PCP - Cardiology (Cardiology) Lissa Morales, MD as PCP - Hematology/Oncology (Oncology) Garner Nash, DO as Consulting Physician (Pulmonary Disease) Curt Bears, MD as Consulting Physician (Oncology) Pickenpack-Cousar, Carlena Sax, NP as Nurse Practitioner (Nurse Practitioner)   I connected with [name] on 07/27/22 at 10:30 AM EST by phone and verified that I am speaking with the correct person using two identifiers.   I discussed the limitations, risks, security and privacy concerns of performing an evaluation and management service by telemedicine and the availability of in-person appointments. I also discussed with the patient that there may be a patient responsible charge related to this service. The patient expressed understanding and agreed to proceed.   Other persons participating in the visit and their role in the encounter: Wife and Maygan, RN    Patient's location: Home   Provider's location: [place]    INTERVAL HISTORY: [name] is a 66 y.o. 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 He was admitted on 08/29/2021 and 09/15/21 from home with uncontrolled intractable chest wall pain. He has completed all of his radiation treatments. Now with plans to undergo immunotherapy as recommended by [facility].    SOCIAL HISTORY:     reports that he quit smoking about 6 years ago. His smoking use included cigarettes. He has a 33.00 pack-year smoking history. He has never used smokeless tobacco. He reports that he does not currently use alcohol. He reports that he does not use drugs.  ADVANCE DIRECTIVES:  None on file   CODE STATUS:   PAST MEDICAL HISTORY: Past Medical History:  Diagnosis Date   Anemia    Anxiety    Arthritis    Basal cell carcinoma (BCC) of forehead    CAD (coronary artery disease)    a. 10/2015 ant STEMI >> LHC with 3 v CAD; oLAD tx with POBA >> emergent CABG. b. Multiple evals since that time, early graft failure of SVG-RCA by cath 03/2016. c. 2/19 PCI/DES x1 to pRCA, normal EF.   Carotid artery disease ([place])    a. 40-59% BICA 02/2018.   Depression    Dyspnea    Ectopic atrial tachycardia    Esophageal reflux    eosinophil esophagitis   Family history of adverse reaction to anesthesia    "sister has PONV" (06/21/2017)   Former tobacco use    Gout    Hepatitis C    "treated and cured" (06/21/2017)   High cholesterol    History of blood transfusion    History of kidney stones    Hypertension    Ischemic cardiomyopathy    a. EF 25-30% at intraop TEE 4/17  //  b. Limited Echo 5/17 - EF 45-50%, mild ant HK. c. EF 55-65% by cath 09/2017.   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[place])    Migraine    "3-4/yr" (06/21/2017)   Myocardial infarction ([place]) 10/2015   Palpitations  Pneumonia    Sinus bradycardia    a. HR dropping into 40s in 02/2016 -> BB reduced.   Stroke [facility]) 10/2016   "small one; sometimes my memory/cognitive issues" (06/21/2017)   Symptomatic hypotension    a. 02/2016 ER visit -> meds reduced.   Syncope    Wears dentures    Wears glasses     ALLERGIES:  is allergic to prednisone, tetanus toxoids, wellbutrin [bupropion], gabapentin, morphine, tetanus antitoxin, varenicline tartrate, lorazepam, and varenicline.  MEDICATIONS:  Current Outpatient  Medications  Medication Sig Dispense Refill   acetaminophen (TYLENOL) 325 MG tablet Take 1-2 tablets (325-650 mg total) by mouth every 4 (four) hours as needed for mild pain.     benzonatate (TESSALON) 100 MG capsule Take 1 capsule (100 mg total) by mouth 3 (three) times daily as needed for cough. TAKE 1 CAPSULE(100 MG) BY MOUTH THREE TIMES DAILY AS NEEDED FOR COUGH Strength: 100 mg 30 capsule 2   bisacodyl (DULCOLAX) 5 MG EC tablet Take 1 tablet (5 mg total) by mouth daily as needed for moderate constipation. (Patient taking differently: Take 5 mg by mouth in the morning, at noon, in the evening, and at bedtime.) 30 tablet 3   docusate sodium (COLACE) 100 MG capsule Take 1 capsule (100 mg total) by mouth 3 (three) times daily. (Patient taking differently: Take 100 mg by mouth daily as needed for moderate constipation.) 10 capsule 0   HYDROmorphone (DILAUDID) 8 MG tablet Take 1 tablet by mouth every 6 hours as needed for severe pain. 60 tablet 0   lidocaine-prilocaine (EMLA) cream Apply to the Port-A-Cath site 30-60-minute before treatment 30 g 0   magnesium gluconate (MAGONATE) 500 MG tablet Take 0.5 tablets (250 mg total) by mouth at bedtime. (Patient taking differently: Take 250 mg by mouth daily as needed.) 30 tablet 0   megestrol (MEGACE) 40 MG/ML suspension SHAKE LIQUID AND TAKE 10 ML(400 MG) BY MOUTH TWICE DAILY 240 mL 0   midodrine (PROAMATINE) 10 MG tablet Take 1 tablet (10 mg total) by mouth 3 (three) times daily with meals. 90 tablet 0   morphine (MS CONTIN) 60 MG 12 hr tablet Take 1 tablet (60 mg total) by mouth every 12 (twelve) hours. 60 tablet 0   naloxone (NARCAN) nasal spray 4 mg/0.1 mL Place 1 spray intranasally into 1 nostril. If desired response not obtained after 2 or 3 minutes, may repeat 1 spray into other nostril 1 each 1   ondansetron (ZOFRAN) 8 MG tablet Take 1 tablet by mouth every 8 hours as needed for nausea or vomiting. Starting 3 days after chemotherapy 90 tablet 2    polyethylene glycol (MIRALAX / GLYCOLAX) 17 g packet Take 17 g by mouth daily. 14 each 0   primidone (MYSOLINE) 50 MG tablet Take 1 tablet (50 mg total) by mouth daily. 30 tablet 3   prochlorperazine (COMPAZINE) 10 MG tablet Take 10 mg by mouth every 6 (six) hours as needed for nausea or vomiting.     traZODone (DESYREL) 50 MG tablet Take 1 tablet (50 mg total) by mouth at bedtime. 90 tablet 3   Vitamin D, Ergocalciferol, (DRISDOL) 1.25 MG (50000 UNIT) CAPS capsule Take 1 capsule (50,000 Units total) by mouth every 7 (seven) days. 5 capsule 5   XARELTO 20 MG TABS tablet Take 1 tablet (20 mg total) by mouth every evening. 90 tablet 1   No current facility-administered medications for this visit.    VITAL SIGNS: There were no vitals taken for  this visit. There were no vitals filed for this visit.    Estimated body mass index is 18.61 kg/m as calculated from the following:   Height as of an earlier encounter on 07/27/22: 5\' 9"  (1.753 m).   Weight as of an earlier encounter on 07/27/22: 126 lb (57.2 kg).   PERFORMANCE STATUS (ECOG) : 1 - Symptomatic but completely ambulatory  IMPRESSION:  I spoke with Mr. Birchall and his wife by phone for symptom management follow-up. He continues to complain of some weakness and tremors. He does feel some improvement since starting primidone. Wife shares he has an appointment this Friday with [facility] outpatient rehab as he has maxed out on his home therapy. Has scan and follow-up with [place] [facility] team the week of December 13th. He is anxious about how this will result as he knows findings will warrant future options. Support provided.   Wife shares he is able to provide some care for himself as this has been difficult over the past several months.  Wife shared per plans discussed with [facility] if patient's performance status was to improve or further changes the team advised they could potentially consider a different regimen. He has been discontinued from  previous clinic trial.   Is home oxygen has been delivered. Insomnia has improved with extra strength melatonin and occasional use of trazadone.   Neoplasm related pain Pain remains controlled on current regimen. Continues to tolerate prescribed regimen including MS Contin 60 mg and hydromorphone 8mg . We will continue to closely monitor.   2. Constipation Denies concerns with constipation.    3. Goals of Care  Marvelle and his wife continue to remain hopeful for some stability. They are being managed by the team at [facility]. Encouraged to continue with close follow-up. If goals become to focus solely on his comfort with no further oncological interventions [place] is a candidate for in home hospice support given poor long-term prognosis. Ongoing goals of care discussions.   PLAN: MS Contin 60 mg every 12 hours Dilaudid 8 mg as needed for breakthrough pain  Zofran and compazine as needed for ongoing nausea/vomiting.  Miralax and Colace daily for bowel regimen. We will continue to closely monitor medications and evaluate as needed.  Home oxygen 2 L nasal cannula  Ongoing goals of care discussions. I will plan to see him back in 4-6 weeks in collaboration with his other oncology appointments.  He knows to call with any needs prior to next visit.   Patient expressed understanding and was in agreement with this plan. He also understands that He can call the clinic at any time with any questions, concerns, or complaints.   Any controlled substances utilized were prescribed in the context of palliative care. PDMP has been reviewed.    Time Total: 45 min   Visit consisted of counseling and education dealing with the complex and emotionally intense issues of symptom management and palliative care in the setting of serious and potentially life-threatening illness.Greater than 50%  of this time was spent counseling and coordinating care related to the above assessment and plan.  Alda Lea, AGPCNP-BC  Palliative Medicine Team/[place]

## 2022-07-27 NOTE — Progress Notes (Signed)
[facility]  PATIENT: [name] DOB: 03-05-1956  REFERRING CLINICIAN: Izora Ribas, MD HISTORY FROM: patient and daughter REASON FOR VISIT: new consult   HISTORICAL  CHIEF COMPLAINT:  Chief Complaint  Patient presents with   New Patient (Initial Visit)    Pt with daughter rm 7. Presents today with ongoing concerns for tremor.pt states tremors started about the time he started having symptoms related to brain. He had brain radiation end of June first of July and the tremors worsened initially. The daughter notes over the months the tremors have reduced and somewhat are better. He takes primidone    HISTORY OF PRESENT ILLNESS:   66 year old male here for evaluation of tremors, memory loss, weakness, restlessness, insomnia.  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  This was treated with chemotherapy.  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  He had additional treatment in December 2022 and February 2023.  He then had whole brain radiation therapy in June 2023.  Around this time he was noted to have additional whole-body tremors, memory loss, restlessness, insomnia.    REVIEW OF SYSTEMS: Full 14 system review of systems performed and negative with exception of: AS PER hpi.  ALLERGIES: Allergies  Allergen Reactions   Prednisone Other (See Comments)    States that this med makes him "crazy" Other reaction(s): Not available   Tetanus Toxoids Swelling and Other (See Comments)    Fever, Swelling of the arm    Wellbutrin [Bupropion] Other (See Comments)    Crazy thoughts, nightmares   Gabapentin     Other reaction(s): Unknown   Morphine Other (See Comments)   Tetanus Antitoxin Other (See Comments)   Varenicline Tartrate Other (See Comments)   Lorazepam Anxiety and Other (See Comments)     Makes him confused   Varenicline Other (See Comments)    Unpleasant dreams    HOME MEDICATIONS: Outpatient Medications Prior to Visit  Medication Sig Dispense Refill   acetaminophen (TYLENOL) 325 MG tablet Take 1-2 tablets (325-650 mg total) by mouth every 4 (four) hours as needed for mild pain.     benzonatate (TESSALON) 100 MG capsule Take 1 capsule (100 mg total) by mouth 3 (three) times daily as needed for cough. TAKE 1 CAPSULE(100 MG) BY MOUTH THREE TIMES DAILY AS NEEDED FOR COUGH Strength: 100 mg 30 capsule 2   bisacodyl (DULCOLAX) 5 MG EC tablet Take 1 tablet (5 mg total) by mouth daily as needed for moderate constipation. (Patient taking differently: Take 5 mg by mouth in the morning, at noon, in the evening, and at bedtime.) 30 tablet 3   docusate sodium (COLACE) 100 MG capsule Take 1 capsule (100 mg total) by mouth 3 (three) times daily. (Patient taking differently: Take 100 mg by mouth daily as needed for moderate constipation.) 10 capsule 0   HYDROmorphone (DILAUDID) 8 MG tablet Take 1 tablet by mouth every 6 hours as needed for severe pain. 60 tablet 0   lidocaine-prilocaine (EMLA) cream Apply to the Port-A-Cath site 30-60-minute before treatment 30 g 0   magnesium gluconate (MAGONATE) 500 MG tablet Take 0.5 tablets (250 mg total) by mouth at bedtime. (Patient taking differently: Take 250 mg by mouth daily as needed.) 30 tablet 0   megestrol (MEGACE) 40 MG/ML suspension SHAKE LIQUID AND TAKE 10 ML(400 MG) BY MOUTH TWICE DAILY 240 mL  0   midodrine (PROAMATINE) 10 MG tablet Take 1 tablet (10 mg total) by mouth 3 (three) times daily with meals. 90 tablet 0   morphine (MS CONTIN) 60 MG 12 hr tablet Take 1 tablet (60 mg total) by mouth every 12 (twelve) hours. 60 tablet 0   naloxone (NARCAN) nasal spray 4 mg/0.1 mL Place 1 spray intranasally into 1 nostril. If desired response not obtained after 2 or 3 minutes, may repeat 1 spray into other nostril 1 each 1   ondansetron (ZOFRAN) 8 MG  tablet Take 1 tablet by mouth every 8 hours as needed for nausea or vomiting. Starting 3 days after chemotherapy 90 tablet 2   polyethylene glycol (MIRALAX / GLYCOLAX) 17 g packet Take 17 g by mouth daily. 14 each 0   primidone (MYSOLINE) 50 MG tablet Take 1 tablet (50 mg total) by mouth daily. 30 tablet 3   prochlorperazine (COMPAZINE) 10 MG tablet Take 10 mg by mouth every 6 (six) hours as needed for nausea or vomiting.     traZODone (DESYREL) 50 MG tablet Take 1 tablet (50 mg total) by mouth at bedtime. 90 tablet 3   Vitamin D, Ergocalciferol, (DRISDOL) 1.25 MG (50000 UNIT) CAPS capsule Take 1 capsule (50,000 Units total) by mouth every 7 (seven) days. 5 capsule 5   XARELTO 20 MG TABS tablet Take 1 tablet (20 mg total) by mouth every evening. 90 tablet 1   meloxicam (MOBIC) 15 MG tablet Take 1 tablet (15 mg total) by mouth daily. 30 tablet 5   diclofenac Sodium (VOLTAREN) 1 % GEL Apply 2 g topically 4 (four) times daily. 100 g 0   methocarbamol (ROBAXIN) 500 MG tablet Take 1 tablet (500 mg total) by mouth every 6 (six) hours as needed for muscle spasms. (Patient not taking: Reported on 07/12/2022) 90 tablet 3   propranolol (INDERAL) 10 MG tablet Take 1 tablet (10 mg total) by mouth 2 (two) times daily. (Patient not taking: Reported on 07/12/2022) 180 tablet 3   scopolamine (TRANSDERM-SCOP) 1 MG/3DAYS Place 1 patch (1.5 mg total) onto the skin every 3 (three) days. 10 patch 0   No facility-administered medications prior to visit.    PAST MEDICAL HISTORY: Past Medical History:  Diagnosis Date   Anemia    Anxiety    Arthritis    Basal cell carcinoma (BCC) of forehead    CAD (coronary artery disease)    a. 10/2015 ant STEMI >> LHC with 3 v CAD; oLAD tx with POBA >> emergent CABG. b. Multiple evals since that time, early graft failure of SVG-RCA by cath 03/2016. c. 2/19 PCI/DES x1 to pRCA, normal EF.   Carotid artery disease ([place])    a. 40-59% BICA 02/2018.   Depression    Dyspnea    Ectopic  atrial tachycardia    Esophageal reflux    eosinophil esophagitis   Family history of adverse reaction to anesthesia    "sister has PONV" (06/21/2017)   Former tobacco use    Gout    Hepatitis C    "treated and cured" (06/21/2017)   High cholesterol    History of blood transfusion    History of kidney stones    Hypertension    Ischemic cardiomyopathy    a. EF 25-30% at intraop TEE 4/17  //  b. Limited Echo 5/17 - EF 45-50%, mild ant HK. c. EF 55-65% by cath 09/2017.   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HCC)    Migraine    "3-4/yr" (  06/21/2017)   Myocardial infarction ([place]) 10/2015   Palpitations    Pneumonia    Sinus bradycardia    a. HR dropping into 40s in 02/2016 -> BB reduced.   Stroke [facility]) 10/2016   "small one; sometimes my memory/cognitive issues" (06/21/2017)   Symptomatic hypotension    a. 02/2016 ER visit -> meds reduced.   Syncope    Wears dentures    Wears glasses     PAST SURGICAL HISTORY: Past Surgical History:  Procedure Laterality Date   25 GAUGE PARS PLANA VITRECTOMY WITH 20 GAUGE MVR PORT  12/31/2020   Procedure: 25 GAUGE PARS PLANA VITRECTOMY WITH 20 GAUGE MVR PORT;  Surgeon: [name], MD;  Location: [place];  Service: Thoracic;;   ANTERIOR CERVICAL DECOMP/DISCECTOMY FUSION N/A 10/17/2018   Procedure: Anterior Cervical Decompression Fusion - Cervical seven -Thoracic one;  Surgeon: Consuella Lose, MD;  Location: [place];  Service: Neurosurgery;  Laterality: N/A;   BASAL CELL CARCINOMA EXCISION     "forehead   BIOPSY  07/20/2019   Procedure: BIOPSY;  Surgeon: Carol Ada, MD;  Location: [facility];  Service: Endoscopy;;   BIOPSY OF MEDIASTINAL MASS Right 07/29/2021   Procedure: RIGHT CHEST WALL MASS BIOPSY;  Surgeon: [name], MD;  Location: [place];  Service: Thoracic;  Laterality: Right;   BRONCHIAL BIOPSY  12/24/2020   Procedure: BRONCHIAL BIOPSIES;  Surgeon: Garner Nash, DO;  Location: [place] [facility];  Service: Pulmonary;;    BRONCHIAL BRUSHINGS  12/24/2020   Procedure: BRONCHIAL BRUSHINGS;  Surgeon: Garner Nash, DO;  Location: [place];  Service: Pulmonary;;   BRONCHIAL NEEDLE ASPIRATION BIOPSY  12/24/2020   Procedure: BRONCHIAL NEEDLE ASPIRATION BIOPSIES;  Surgeon: Garner Nash, DO;  Location: [place];  Service: Pulmonary;;   CARDIAC CATHETERIZATION N/A 11/28/2015   Procedure: Left Heart Cath and Coronary Angiography;  Surgeon: [name], MD;  Location: [place] [facility];  Service: Cardiovascular;  Laterality: N/A;   CARDIAC CATHETERIZATION N/A 11/28/2015   Procedure: Coronary Balloon Angioplasty;  Surgeon: [name], MD;  Location: [place] [facility];  Service: Cardiovascular;  Laterality: N/A;  ostial LAD   CARDIAC CATHETERIZATION N/A 11/28/2015   Procedure: Coronary/Graft Angiography;  Surgeon: [name], MD;  Location: [place] [facility];  Service: Cardiovascular;  Laterality: N/A;  coronaries only    CARDIAC CATHETERIZATION N/A 04/21/2016   Procedure: Left Heart Cath and Coronary Angiography;  Surgeon: Wellington Hampshire, MD;  Location: [place] [facility];  Service: Cardiovascular;  Laterality: N/A;   CARDIAC CATHETERIZATION N/A 06/14/2016   Procedure: Left Heart Cath and Cors/Grafts Angiography;  Surgeon: Lorretta Harp, MD;  Location: [place] [facility];  Service: Cardiovascular;  Laterality: N/A;   CARDIAC CATHETERIZATION N/A 09/08/2016   Procedure: Left Heart Cath and Cors/Grafts Angiography;  Surgeon: Wellington Hampshire, MD;  Location: [place] [facility];  Service: Cardiovascular;  Laterality: N/A;   CARDIAC CATHETERIZATION     CORONARY ARTERY BYPASS GRAFT N/A 11/28/2015   Procedure: CORONARY ARTERY BYPASS GRAFTING (CABG) TIMES FIVE USING LEFT INTERNAL MAMMARY ARTERY AND RIGHT GREATER SAPHENOUS,VIEN HARVEATED BY ENDOVIEN, INTRAOPPRATIVE TEE;  Surgeon: Gaye Pollack, MD;  Location: [place];  Service: Open Heart Surgery;  Laterality: N/A;   CORONARY STENT INTERVENTION  N/A 10/05/2017   Procedure: CORONARY STENT INTERVENTION;  Surgeon: [name], MD;  Location: [place] [facility];  Service: Cardiovascular;  Laterality: N/A;   ESOPHAGOGASTRODUODENOSCOPY (EGD) WITH PROPOFOL N/A 07/20/2019   Procedure:  ESOPHAGOGASTRODUODENOSCOPY (EGD) WITH PROPOFOL;  Surgeon: Carol Ada, MD;  [facility];  Service: Endoscopy;  Laterality: N/A;   ESOPHAGOGASTRODUODENOSCOPY (EGD) WITH PROPOFOL N/A 01/30/2021   Procedure: ESOPHAGOGASTRODUODENOSCOPY (EGD) WITH PROPOFOL;  Surgeon: Carol Ada, MD;  [facility];  Service: Endoscopy;  Laterality: N/A;   FIDUCIAL MARKER PLACEMENT  12/24/2020   Procedure: FIDUCIAL DYE MARKER PLACEMENT;  Surgeon: Garner Nash, DO;  Location: [place] [facility];  Service: Pulmonary;;   HUMERUS SURGERY Right 1969   "tumor inside bone; filled it w/bone chips"   INTERCOSTAL NERVE BLOCK Right 12/24/2020   Procedure: INTERCOSTAL NERVE BLOCK;  Surgeon: [name], MD;  Location: [place];  Service: Thoracic;  Laterality: Right;   IR IMAGING GUIDED PORT INSERTION  02/06/2021   LEFT HEART CATH AND CORS/GRAFTS ANGIOGRAPHY N/A 03/11/2017   Procedure: Left Heart Cath and Cors/Grafts Angiography;  Surgeon: Leonie Man, MD;  Location: [place] [facility];  Service: Cardiovascular;  Laterality: N/A;   LEFT HEART CATH AND CORS/GRAFTS ANGIOGRAPHY N/A 10/05/2017   Procedure: LEFT HEART CATH AND CORS/GRAFTS ANGIOGRAPHY;  Surgeon: [name], MD;  Location: [place] [facility];  Service: Cardiovascular;  Laterality: N/A;   LEFT HEART CATH AND CORS/GRAFTS ANGIOGRAPHY N/A 04/11/2019   Procedure: LEFT HEART CATH AND CORS/GRAFTS ANGIOGRAPHY;  Surgeon: [name], MD;  Location: [place] [facility];  Service: Cardiovascular;  Laterality: N/A;   NODE DISSECTION Right 12/24/2020   Procedure: NODE DISSECTION;  Surgeon: [name], MD;  Location: [place];  Service: Thoracic;  Laterality: Right;   PERIPHERAL VASCULAR  CATHETERIZATION N/A 06/14/2016   Procedure: Lower Extremity Angiography;  Surgeon: Lorretta Harp, MD;  Location: [place] [facility];  Service: Cardiovascular;  Laterality: N/A;   VIDEO BRONCHOSCOPY WITH ENDOBRONCHIAL NAVIGATION Right 12/24/2020   Procedure: VIDEO BRONCHOSCOPY WITH ENDOBRONCHIAL NAVIGATION;  Surgeon: Garner Nash, DO;  Location: [place];  Service: Pulmonary;  Laterality: Right;   VIDEO BRONCHOSCOPY WITH ENDOBRONCHIAL ULTRASOUND N/A 12/24/2020   Procedure: VIDEO BRONCHOSCOPY WITH ENDOBRONCHIAL ULTRASOUND;  Surgeon: Garner Nash, DO;  Location: [place];  Service: Pulmonary;  Laterality: N/A;   VIDEO BRONCHOSCOPY WITH INSERTION OF INTERBRONCHIAL VALVE (IBV) N/A 12/31/2020   Procedure: VIDEO BRONCHOSCOPY WITH INSERTION OF INTERBRONCHIAL VALVE (IBV).VALVE IN CARTRIDGE 56mm,9mm. CHEST TUBE PLACEMENT.;  Surgeon: [name], MD;  Location[facility];  Service: Thoracic;  Laterality: N/A;   VIDEO BRONCHOSCOPY WITH INSERTION OF INTERBRONCHIAL VALVE (IBV) N/A 07/29/2021   Procedure: VIDEO BRONCHOSCOPY WITH REMOVAL OF INTERBRONCHIAL VALVE (IBV);  Surgeon: [name], MD;  Location: [facility];  Service: Thoracic;  Laterality: N/A;    FAMILY HISTORY: Family History  Problem Relation Age of Onset   Lung cancer Mother    Heart Problems Father    Heart attack Father 45   Stroke Father    Heart failure Father    Heart attack Maternal Grandmother    Stroke Maternal Grandmother    Heart attack Paternal Uncle    Hypertension Brother    Autoimmune disease Neg Hx     SOCIAL HISTORY: Social History   Socioeconomic History   Marital status: Married    Spouse name: [name]   Number of children: 3   Years of education: [employer] education level: Not on file  Occupational History   Occupation: Scientist, research (physical sciences): SELF-EMPLOYED  Tobacco Use   Smoking status: Former    Packs/day: 0.75    Years: 44.00    Total pack  years: 33.00    Types:  Cigarettes    Quit date: 11/28/2015    Years since quitting: 6.6   Smokeless tobacco: Never  Vaping Use   Vaping Use: Never used  Substance and Sexual Activity   Alcohol use: Not Currently   Drug use: No    Comment: 06/21/2017 "nothing since the 1980s"   Sexual activity: Yes  Other Topics Concern   Not on file  Social History Narrative   Patient lives at home with his spouse.   Caffeine Use: yes   Social Determinants of Health   Financial Resource Strain: Not on file  Food Insecurity: No Food Insecurity (05/25/2022)   Hunger Vital Sign    Worried About Running Out of Food in the Last Year: Never true    Ran Out of Food in the Last Year: Never true  Transportation Needs: No Transportation Needs (05/25/2022)   PRAPARE - Hydrologist (Medical): No    Lack of Transportation (Non-Medical): No  Physical Activity: Not on file  Stress: Not on file  Social Connections: Not on file  Intimate Partner Violence: Not At Risk (08/13/2021)   Humiliation, Afraid, Rape, and Kick questionnaire    Fear of Current or Ex-Partner: No    Emotionally Abused: No    Physically Abused: No    Sexually Abused: No     PHYSICAL EXAM  GENERAL EXAM/CONSTITUTIONAL: Vitals:  Vitals:   07/27/22 0842  BP: 112/62  Pulse: (!) 125  Weight: 126 lb (57.2 kg)  Height: 5\' 9"  (1.753 m)   Body mass index is 18.61 kg/m. Wt Readings from Last 3 Encounters:  07/27/22 126 lb (57.2 kg)  07/12/22 127 lb (57.6 kg)  07/06/22 127 lb (57.6 kg)   Patient is in no distress; well developed, nourished and groomed; neck is supple  CARDIOVASCULAR: Examination of carotid arteries is normal; no carotid bruits Regular rate and rhythm, no murmurs Examination of peripheral vascular system by observation and palpation is normal  EYES: Ophthalmoscopic exam of optic discs and posterior segments is normal; no papilledema or hemorrhages No results found.  MUSCULOSKELETAL: Gait, strength, tone,  movements noted in Neurologic exam below  NEUROLOGIC: MENTAL STATUS:      No data to display         awake, alert, oriented to person, place and time recent and remote memory intact normal attention and concentration language fluent, comprehension intact, naming intact fund of knowledge appropriate  CRANIAL NERVE:  2nd - no papilledema on fundoscopic exam 2nd, 3rd, 4th, 6th - pupils equal and reactive to light, visual fields full to confrontation, extraocular muscles intact, no nystagmus 5th - facial sensation symmetric 7th - facial strength --> LEFT LOWER FACIAL WEAKNESS 8th - hearing intact 9th - palate elevates symmetrically, uvula midline 11th - shoulder shrug --> DECR IN LEFT SHOULDER 12th - tongue protrusion midline  MOTOR:  RUE, RLE NORMAL SLIGHTLY INCREASED TONE IN LUE AND LLE LUE 4+, LLE 4+ SLOWER ON LEFT SIDE   SENSORY:  normal and symmetric to light touch, temperature, vibration  COORDINATION:  finger-nose-finger, fine finger movements normal  REFLEXES:  deep tendon reflexes BRISK IN BUE AND KNEES; 1+ AT ANKLES  GAIT/STATION:  narrow based gait; STOOPED POSTURE; SLOW MOVEMENTS     DIAGNOSTIC DATA (LABS, IMAGING, TESTING) - I reviewed patient records, labs, notes, testing and imaging myself where available.  Lab Results  Component Value Date   WBC 5.5 07/06/2022   HGB 11.6 (L) 07/06/2022  HCT 34.0 (L) 07/06/2022   MCV 85.7 07/06/2022   PLT 210 07/06/2022      Component Value Date/Time   NA 138 07/06/2022 2207   K 4.3 07/06/2022 2207   CL 107 07/06/2022 2140   CO2 23 07/06/2022 2140   GLUCOSE 103 (H) 07/06/2022 2140   BUN 28 (H) 07/06/2022 2140   CREATININE 1.08 07/06/2022 2140   CREATININE 0.78 03/30/2022 0811   CREATININE 1.00 02/12/2016 1038   CALCIUM 10.4 (H) 07/06/2022 2140   PROT 7.9 07/06/2022 2140   PROT 6.7 11/08/2017 0833   ALBUMIN 3.6 07/06/2022 2140   ALBUMIN 4.4 11/08/2017 0833   AST 36 07/06/2022 2140   AST 31  03/30/2022 0811   ALT 17 07/06/2022 2140   ALT 27 03/30/2022 0811   ALKPHOS 75 07/06/2022 2140   BILITOT 0.8 07/06/2022 2140   BILITOT 0.6 03/30/2022 0811   GFRNONAA >60 07/06/2022 2140   GFRNONAA >60 03/30/2022 0811   GFRAA >60 05/27/2020 1039   Lab Results  Component Value Date   CHOL 82 12/27/2020   HDL 35 (L) 12/27/2020   LDLCALC 28 12/27/2020   TRIG 97 12/27/2020   CHOLHDL 2.3 12/27/2020   Lab Results  Component Value Date   HGBA1C 5.0 11/10/2016   Lab Results  Component Value Date   VOHYWVPX10 626 03/15/2022   Lab Results  Component Value Date   TSH 1.711 03/30/2022    05/20/22 MRI brain [I reviewed images myself and agree with interpretation. -VRP]  - Multiple enhancing lesions are again noted. Redemonstrated left cerebellar lesion measures 7 x 7 x 6 mm (AP x TR x CC) (series 10, image 10 and series 11, image 12), previously 7 x 6 x 6 mm, overall unchanged, with slightly increased surrounding edema. Right frontal lesion measures 7 x 6 x 8 mm (series 10, image 45 and series 11, image 19), previously 6 x 7 x 6 mm with increased surrounding edema. 3 mm left thalamic/internal capsule lesion (series 10, image 27), unchanged. 3 mm enhancing lesion in the vermis (series 10, image 20), with minimal associated edema, previously 2 mm. New 3 mm lesion in the medial right temporal lobe (series 10, image 21), with mild associated edema. 1. New 3 mm enhancing lesion in the medial right temporal lobe. 2. Slight interval increase in the size of previously noted right frontal lesion, with increased associated edema. 3. Other previously present enhancing lesions are overall unchanged.   ASSESSMENT AND PLAN  66 y.o. year old male here with:   Dx:  1. Memory loss     PLAN:  COGNITIVE DECLINE - likely related to CNS metastatic dz and WBRT side effects; also insomnia, pain, pain meds can contribute - follow up neuropsychology testing - check B12 level (due to poor  nutrition and weight loss)  TREMOR (mild postural and action tremor; could be related to essential tremor) - responding to primidone 50mg  daily; stable today; in future could increase dose  Orders Placed This Encounter  Procedures   Vitamin B12   Return in about 3 months (around 10/27/2022).    Penni Bombard, MD 94/85/4627, 03:50 AM Certified in Neurology, Neurophysiology and Neuroimaging  [facility] [address] [place], [place] [address] [phone]

## 2022-07-28 ENCOUNTER — Other Ambulatory Visit: Payer: Self-pay | Admitting: Nurse Practitioner

## 2022-07-28 ENCOUNTER — Other Ambulatory Visit (HOSPITAL_COMMUNITY): Payer: Self-pay

## 2022-07-28 DIAGNOSIS — C7A1 Malignant poorly differentiated neuroendocrine tumors: Secondary | ICD-10-CM

## 2022-07-28 DIAGNOSIS — G893 Neoplasm related pain (acute) (chronic): Secondary | ICD-10-CM

## 2022-07-28 DIAGNOSIS — Z515 Encounter for palliative care: Secondary | ICD-10-CM

## 2022-07-28 LAB — VITAMIN B12: Vitamin B-12: 337 pg/mL (ref 232–1245)

## 2022-07-28 MED ORDER — HYDROMORPHONE HCL 8 MG PO TABS
8.0000 mg | ORAL_TABLET | Freq: Four times a day (QID) | ORAL | 0 refills | Status: AC | PRN
  Filled 2022-07-28: qty 60, 15d supply, fill #0

## 2022-07-28 MED ORDER — MORPHINE SULFATE ER 60 MG PO TBCR: 60.0000 mg | EXTENDED_RELEASE_TABLET | Freq: Two times a day (BID) | ORAL | 0 refills | Status: AC

## 2022-07-30 ENCOUNTER — Telehealth: Payer: Self-pay | Admitting: Interventional Cardiology

## 2022-07-30 ENCOUNTER — Ambulatory Visit: Payer: Medicare Other | Admitting: Physical Therapy

## 2022-07-30 ENCOUNTER — Other Ambulatory Visit: Payer: Self-pay

## 2022-07-30 MED ORDER — XARELTO 20 MG PO TABS: 20.0000 mg | ORAL_TABLET | Freq: Every evening | ORAL | 5 refills | Status: AC

## 2022-07-30 NOTE — Telephone Encounter (Signed)
*  STAT* If patient is at the pharmacy, call can be transferred to refill team.   1. Which medications need to be refilled? (please list name of each medication and dose if known) XARELTO 20 MG TABS tablet   2. Which pharmacy/location (including street and city if local pharmacy) is medication to be sent to? [id number] - [place], [place] - [address]   3. Do they need a 30 day or 90 day supply? 30 day

## 2022-07-30 NOTE — Telephone Encounter (Signed)
Prescription refill request for Xarelto received.  Indication:afib Last office visit:7/23 Weight:57.2  kg Age:66 Scr:1.0 CrCl:57.99  ml/min  Prescription refilled

## 2022-07-30 NOTE — Telephone Encounter (Signed)
Patient's wife states they are unable to afford the Xarelto refills. Provided her with the contact information for Xarelto - [facility]. Understanding verbalized.

## 2022-07-30 NOTE — Telephone Encounter (Signed)
Pt c/o medication issue:  1. Name of Medication:  XARELTO 20 MG TABS tablet  2. How are you currently taking this medication (dosage and times per day)?   3. Are you having a reaction (difficulty breathing--STAT)?   4. What is your medication issue?    Patient's wife states this medication will cost the patient $450 for a refill and she would like to know if he can be placed on a different medication. Please advise.

## 2022-08-01 ENCOUNTER — Other Ambulatory Visit: Payer: Self-pay

## 2022-08-01 ENCOUNTER — Emergency Department (HOSPITAL_COMMUNITY): Payer: Medicare Other

## 2022-08-01 ENCOUNTER — Inpatient Hospital Stay (HOSPITAL_COMMUNITY)
Admission: EM | Admit: 2022-08-01 | Discharge: 2022-08-05 | DRG: 054 | Disposition: A | Discharge status: Home / Self Care | Payer: Medicare Other | Attending: Internal Medicine | Admitting: Internal Medicine

## 2022-08-01 ENCOUNTER — Encounter (HOSPITAL_COMMUNITY): Payer: Self-pay

## 2022-08-01 DIAGNOSIS — Z87891 Personal history of nicotine dependence: Secondary | ICD-10-CM

## 2022-08-01 DIAGNOSIS — Z801 Family history of malignant neoplasm of trachea, bronchus and lung: Secondary | ICD-10-CM

## 2022-08-01 DIAGNOSIS — G43909 Migraine, unspecified, not intractable, without status migrainosus: Secondary | ICD-10-CM | POA: Diagnosis present

## 2022-08-01 DIAGNOSIS — I25810 Atherosclerosis of coronary artery bypass graft(s) without angina pectoris: Secondary | ICD-10-CM | POA: Diagnosis present

## 2022-08-01 DIAGNOSIS — E43 Unspecified severe protein-calorie malnutrition: Secondary | ICD-10-CM | POA: Insufficient documentation

## 2022-08-01 DIAGNOSIS — I10 Essential (primary) hypertension: Secondary | ICD-10-CM | POA: Diagnosis present

## 2022-08-01 DIAGNOSIS — R52 Pain, unspecified: Secondary | ICD-10-CM | POA: Diagnosis present

## 2022-08-01 DIAGNOSIS — C7931 Secondary malignant neoplasm of brain: Principal | ICD-10-CM | POA: Diagnosis present

## 2022-08-01 DIAGNOSIS — Z8701 Personal history of pneumonia (recurrent): Secondary | ICD-10-CM

## 2022-08-01 DIAGNOSIS — Z8249 Family history of ischemic heart disease and other diseases of the circulatory system: Secondary | ICD-10-CM

## 2022-08-01 DIAGNOSIS — K5900 Constipation, unspecified: Secondary | ICD-10-CM | POA: Diagnosis present

## 2022-08-01 DIAGNOSIS — M6258 Muscle wasting and atrophy, not elsewhere classified, other site: Secondary | ICD-10-CM | POA: Diagnosis present

## 2022-08-01 DIAGNOSIS — Z79899 Other long term (current) drug therapy: Secondary | ICD-10-CM

## 2022-08-01 DIAGNOSIS — Z66 Do not resuscitate: Secondary | ICD-10-CM | POA: Diagnosis present

## 2022-08-01 DIAGNOSIS — Z85828 Personal history of other malignant neoplasm of skin: Secondary | ICD-10-CM

## 2022-08-01 DIAGNOSIS — Z885 Allergy status to narcotic agent status: Secondary | ICD-10-CM

## 2022-08-01 DIAGNOSIS — M549 Dorsalgia, unspecified: Secondary | ICD-10-CM | POA: Diagnosis present

## 2022-08-01 DIAGNOSIS — Z79818 Long term (current) use of other agents affecting estrogen receptors and estrogen levels: Secondary | ICD-10-CM

## 2022-08-01 DIAGNOSIS — I951 Orthostatic hypotension: Secondary | ICD-10-CM | POA: Diagnosis present

## 2022-08-01 DIAGNOSIS — I255 Ischemic cardiomyopathy: Secondary | ICD-10-CM | POA: Diagnosis present

## 2022-08-01 DIAGNOSIS — Z87442 Personal history of urinary calculi: Secondary | ICD-10-CM

## 2022-08-01 DIAGNOSIS — G250 Essential tremor: Secondary | ICD-10-CM | POA: Diagnosis present

## 2022-08-01 DIAGNOSIS — I2510 Atherosclerotic heart disease of native coronary artery without angina pectoris: Secondary | ICD-10-CM | POA: Diagnosis present

## 2022-08-01 DIAGNOSIS — Z9221 Personal history of antineoplastic chemotherapy: Secondary | ICD-10-CM

## 2022-08-01 DIAGNOSIS — Z8619 Personal history of other infectious and parasitic diseases: Secondary | ICD-10-CM

## 2022-08-01 DIAGNOSIS — Z902 Acquired absence of lung [part of]: Secondary | ICD-10-CM

## 2022-08-01 DIAGNOSIS — E7800 Pure hypercholesterolemia, unspecified: Secondary | ICD-10-CM | POA: Diagnosis present

## 2022-08-01 DIAGNOSIS — Z887 Allergy status to serum and vaccine status: Secondary | ICD-10-CM

## 2022-08-01 DIAGNOSIS — F419 Anxiety disorder, unspecified: Secondary | ICD-10-CM | POA: Diagnosis present

## 2022-08-01 DIAGNOSIS — Z681 Body mass index (BMI) 19 or less, adult: Secondary | ICD-10-CM

## 2022-08-01 DIAGNOSIS — Z85118 Personal history of other malignant neoplasm of bronchus and lung: Secondary | ICD-10-CM

## 2022-08-01 DIAGNOSIS — C3490 Malignant neoplasm of unspecified part of unspecified bronchus or lung: Secondary | ICD-10-CM | POA: Diagnosis present

## 2022-08-01 DIAGNOSIS — Z7901 Long term (current) use of anticoagulants: Secondary | ICD-10-CM

## 2022-08-01 DIAGNOSIS — I252 Old myocardial infarction: Secondary | ICD-10-CM

## 2022-08-01 DIAGNOSIS — Z79891 Long term (current) use of opiate analgesic: Secondary | ICD-10-CM

## 2022-08-01 DIAGNOSIS — I480 Paroxysmal atrial fibrillation: Secondary | ICD-10-CM | POA: Diagnosis present

## 2022-08-01 DIAGNOSIS — Z981 Arthrodesis status: Secondary | ICD-10-CM

## 2022-08-01 DIAGNOSIS — C7951 Secondary malignant neoplasm of bone: Secondary | ICD-10-CM | POA: Insufficient documentation

## 2022-08-01 DIAGNOSIS — K219 Gastro-esophageal reflux disease without esophagitis: Secondary | ICD-10-CM | POA: Diagnosis present

## 2022-08-01 DIAGNOSIS — F32A Depression, unspecified: Secondary | ICD-10-CM | POA: Diagnosis present

## 2022-08-01 DIAGNOSIS — Z823 Family history of stroke: Secondary | ICD-10-CM

## 2022-08-01 DIAGNOSIS — M546 Pain in thoracic spine: Secondary | ICD-10-CM | POA: Diagnosis not present

## 2022-08-01 DIAGNOSIS — G893 Neoplasm related pain (acute) (chronic): Secondary | ICD-10-CM | POA: Diagnosis present

## 2022-08-01 DIAGNOSIS — Z888 Allergy status to other drugs, medicaments and biological substances status: Secondary | ICD-10-CM

## 2022-08-01 DIAGNOSIS — Z8673 Personal history of transient ischemic attack (TIA), and cerebral infarction without residual deficits: Secondary | ICD-10-CM

## 2022-08-01 LAB — CBC WITH DIFFERENTIAL/PLATELET
Abs Immature Granulocytes: 0.04 10*3/uL (ref 0.00–0.07)
Basophils Absolute: 0 10*3/uL (ref 0.0–0.1)
Basophils Relative: 0 %
Eosinophils Absolute: 0 10*3/uL (ref 0.0–0.5)
Eosinophils Relative: 1 %
HCT: 36.5 % — ABNORMAL LOW (ref 39.0–52.0)
Hemoglobin: 12 g/dL — ABNORMAL LOW (ref 13.0–17.0)
Immature Granulocytes: 1 %
Lymphocytes Relative: 11 %
Lymphs Abs: 0.8 10*3/uL (ref 0.7–4.0)
MCH: 29.3 pg (ref 26.0–34.0)
MCHC: 32.9 g/dL (ref 30.0–36.0)
MCV: 89.2 fL (ref 80.0–100.0)
Monocytes Absolute: 0.7 10*3/uL (ref 0.1–1.0)
Monocytes Relative: 9 %
Neutro Abs: 6.1 10*3/uL (ref 1.7–7.7)
Neutrophils Relative %: 78 %
Platelets: 308 10*3/uL (ref 150–400)
RBC: 4.09 MIL/uL — ABNORMAL LOW (ref 4.22–5.81)
RDW: 15 % (ref 11.5–15.5)
WBC: 7.6 10*3/uL (ref 4.0–10.5)
nRBC: 0 % (ref 0.0–0.2)

## 2022-08-01 LAB — COMPREHENSIVE METABOLIC PANEL
ALT: 15 U/L (ref 0–44)
AST: 21 U/L (ref 15–41)
Albumin: 3.2 g/dL — ABNORMAL LOW (ref 3.5–5.0)
Alkaline Phosphatase: 63 U/L (ref 38–126)
Anion gap: 8 (ref 5–15)
BUN: 17 mg/dL (ref 8–23)
CO2: 19 mmol/L — ABNORMAL LOW (ref 22–32)
Calcium: 9.9 mg/dL (ref 8.9–10.3)
Chloride: 109 mmol/L (ref 98–111)
Creatinine, Ser: 0.84 mg/dL (ref 0.61–1.24)
GFR, Estimated: >60 mL/min (ref >60)
Glucose, Bld: 93 mg/dL (ref 70–99)
Potassium: 3.7 mmol/L (ref 3.5–5.1)
Sodium: 136 mmol/L (ref 135–145)
Total Bilirubin: 0.7 mg/dL (ref 0.3–1.2)
Total Protein: 7.4 g/dL (ref 6.5–8.1)

## 2022-08-01 LAB — URINALYSIS, ROUTINE W REFLEX MICROSCOPIC
Bilirubin Urine: NEGATIVE
Glucose, UA: NEGATIVE mg/dL
Hgb urine dipstick: NEGATIVE
Ketones, ur: NEGATIVE mg/dL
Leukocytes,Ua: NEGATIVE
Nitrite: NEGATIVE
Specific Gravity, Urine: 1.02 (ref 1.005–1.030)
pH: 6.5 (ref 5.0–8.0)

## 2022-08-01 LAB — URINALYSIS, MICROSCOPIC (REFLEX): Squamous Epithelial / HPF: NONE SEEN (ref 0–5)

## 2022-08-01 LAB — LIPASE, BLOOD: Lipase: 29 U/L (ref 11–51)

## 2022-08-01 MED ORDER — ONDANSETRON HCL 4 MG PO TABS
4.0000 mg | ORAL_TABLET | Freq: Four times a day (QID) | ORAL | Status: DC | PRN
  Administered 2022-08-03 – 2022-08-05 (×2): 4 mg via ORAL
  Filled 2022-08-01 (×2): qty 1

## 2022-08-01 MED ORDER — HYDROMORPHONE HCL 1 MG/ML IJ SOLN
1.0000 mg | INTRAMUSCULAR | Status: DC | PRN
  Administered 2022-08-01 – 2022-08-02 (×4): 1 mg via INTRAVENOUS
  Filled 2022-08-01 (×5): qty 1

## 2022-08-01 MED ORDER — HYDROMORPHONE HCL 2 MG PO TABS
8.0000 mg | ORAL_TABLET | Freq: Four times a day (QID) | ORAL | Status: DC | PRN
  Administered 2022-08-02 – 2022-08-05 (×7): 8 mg via ORAL
  Filled 2022-08-01 (×8): qty 4

## 2022-08-01 MED ORDER — FAMOTIDINE 20 MG PO TABS
20.0000 mg | ORAL_TABLET | Freq: Every day | ORAL | Status: DC
  Administered 2022-08-01 – 2022-08-04 (×4): 20 mg via ORAL
  Filled 2022-08-01 (×4): qty 1

## 2022-08-01 MED ORDER — ONDANSETRON HCL 4 MG/2ML IJ SOLN
4.0000 mg | Freq: Four times a day (QID) | INTRAMUSCULAR | Status: DC | PRN
  Administered 2022-08-01 – 2022-08-02 (×2): 4 mg via INTRAVENOUS
  Filled 2022-08-01 (×3): qty 2

## 2022-08-01 MED ORDER — SENNOSIDES-DOCUSATE SODIUM 8.6-50 MG PO TABS
1.0000 | ORAL_TABLET | Freq: Two times a day (BID) | ORAL | Status: DC
  Administered 2022-08-02 – 2022-08-05 (×4): 1 via ORAL
  Filled 2022-08-01 (×6): qty 1

## 2022-08-01 MED ORDER — MEGESTROL ACETATE 400 MG/10ML PO SUSP
400.0000 mg | Freq: Two times a day (BID) | ORAL | Status: DC
  Administered 2022-08-01 – 2022-08-05 (×8): 400 mg via ORAL
  Filled 2022-08-01 (×8): qty 10

## 2022-08-01 MED ORDER — MAGNESIUM GLUCONATE 500 MG PO TABS: 250.0000 mg | ORAL_TABLET | Freq: Every day | ORAL | Status: DC | PRN

## 2022-08-01 MED ORDER — POLYETHYLENE GLYCOL 3350 17 G PO PACK
17.0000 g | PACK | Freq: Every day | ORAL | Status: DC
  Administered 2022-08-02 – 2022-08-04 (×2): 17 g via ORAL
  Filled 2022-08-01 (×4): qty 1

## 2022-08-01 MED ORDER — PRIMIDONE 50 MG PO TABS
50.0000 mg | ORAL_TABLET | Freq: Every day | ORAL | Status: DC
  Administered 2022-08-01 – 2022-08-05 (×5): 50 mg via ORAL
  Filled 2022-08-01 (×5): qty 1

## 2022-08-01 MED ORDER — RIVAROXABAN 20 MG PO TABS
20.0000 mg | ORAL_TABLET | Freq: Every evening | ORAL | Status: DC
  Administered 2022-08-01 – 2022-08-04 (×4): 20 mg via ORAL
  Filled 2022-08-01 (×4): qty 1

## 2022-08-01 MED ORDER — BISACODYL 5 MG PO TBEC
5.0000 mg | DELAYED_RELEASE_TABLET | Freq: Four times a day (QID) | ORAL | Status: DC
  Administered 2022-08-02 – 2022-08-04 (×3): 5 mg via ORAL
  Filled 2022-08-01 (×6): qty 1

## 2022-08-01 MED ORDER — HYDROMORPHONE HCL 1 MG/ML IJ SOLN
1.0000 mg | Freq: Once | INTRAMUSCULAR | Status: AC
  Administered 2022-08-01: 1 mg via INTRAVENOUS
  Filled 2022-08-01: qty 1

## 2022-08-01 MED ORDER — KETOROLAC TROMETHAMINE 15 MG/ML IJ SOLN: 15.0000 mg | Freq: Once | INTRAMUSCULAR | Status: DC

## 2022-08-01 MED ORDER — PROCHLORPERAZINE MALEATE 10 MG PO TABS
10.0000 mg | ORAL_TABLET | Freq: Four times a day (QID) | ORAL | Status: DC | PRN
  Administered 2022-08-01: 10 mg via ORAL
  Filled 2022-08-01 (×2): qty 1

## 2022-08-01 MED ORDER — HYDROMORPHONE HCL 2 MG/ML IJ SOLN
2.0000 mg | Freq: Once | INTRAMUSCULAR | Status: AC
  Administered 2022-08-01: 2 mg via INTRAVENOUS
  Filled 2022-08-01: qty 1

## 2022-08-01 MED ORDER — MORPHINE SULFATE ER 15 MG PO TBCR
60.0000 mg | EXTENDED_RELEASE_TABLET | Freq: Two times a day (BID) | ORAL | Status: DC
  Administered 2022-08-01 – 2022-08-05 (×8): 60 mg via ORAL
  Filled 2022-08-01 (×8): qty 4

## 2022-08-01 MED ORDER — BENZONATATE 100 MG PO CAPS
200.0000 mg | ORAL_CAPSULE | Freq: Three times a day (TID) | ORAL | Status: DC | PRN
  Administered 2022-08-02 – 2022-08-04 (×7): 200 mg via ORAL
  Filled 2022-08-01 (×7): qty 2

## 2022-08-01 MED ORDER — ACETAMINOPHEN 650 MG RE SUPP: 650.0000 mg | Freq: Four times a day (QID) | RECTAL | Status: DC | PRN

## 2022-08-01 MED ORDER — MIDODRINE HCL 5 MG PO TABS
10.0000 mg | ORAL_TABLET | Freq: Three times a day (TID) | ORAL | Status: DC
  Administered 2022-08-02 – 2022-08-05 (×9): 10 mg via ORAL
  Filled 2022-08-01 (×12): qty 2

## 2022-08-01 MED ORDER — HYDROMORPHONE HCL 1 MG/ML IJ SOLN
1.0000 mg | Freq: Once | INTRAMUSCULAR | Status: AC
  Administered 2022-08-01: 1 mg via INTRAVENOUS

## 2022-08-01 MED ORDER — ACETAMINOPHEN 325 MG PO TABS
650.0000 mg | ORAL_TABLET | Freq: Four times a day (QID) | ORAL | Status: DC | PRN
  Administered 2022-08-04: 650 mg via ORAL
  Filled 2022-08-01: qty 2

## 2022-08-01 MED ORDER — TRAZODONE HCL 50 MG PO TABS
50.0000 mg | ORAL_TABLET | Freq: Every day | ORAL | Status: DC
  Administered 2022-08-01 – 2022-08-04 (×4): 50 mg via ORAL
  Filled 2022-08-01 (×4): qty 1

## 2022-08-01 NOTE — ED Triage Notes (Addendum)
Per EMS- patient c/o back pain and nausea. Patient reports that he normally takes Morphine and Dilaudid and took Dilaudid approx 1 hour ago with no relief.  Patient was given Zofran 4 mg IV prior to arrival to the ED.

## 2022-08-01 NOTE — H&P (Signed)
History and Physical    Patient: [name] LNL:[id number] DOB: [birth year] DOA: 08/01/2022 DOS: the patient was seen and examined on 08/01/2022 PCP: [name], MD  Patient coming from: Home  Chief Complaint:  Chief Complaint  Patient presents with   Back Pain   Nausea   HPI: [name] is a 66 y.o. 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 He denied fever, chills, rhinorrhea, sore throat, wheezing or hemoptysis.  No chest pain, palpitations, diaphoresis, PND, orthopnea or pitting edema of the lower extremities.  His appetite is decreased and he has had occasional constipation, no abdominal pain, nausea, emesis, diarrhea, melena or hematochezia.  No flank pain, dysuria, frequency or hematuria.  No polyuria, polydipsia, polyphagia or blurred vision.   ED course: Initial vital signs were temperature 99.3 F, pulse 113, respiration 16, BP 112/72 mmHg O2 sat 100% on room air.  Lab work: His urinalysis with trace proteinuria and a few bacteria, but otherwise unremarkable.  CBC with a white count 7.6, hemoglobin 12.0 g/dL platelets 308.  Lipase was normal.  CMP showed a CO2 of 19 mmol/L with a normal anion gap and an albumin level 3.2 g/dL.  The rest of the CMP measurements were normal.  Imaging:  Portable chest radiograph with multiple bilateral pulmonary nodules consistent with metastasis and a small right pleural effusion.  CT cervical spine with no fractures, focal lytic or sclerosis lesions.  There is cervical spondylosis on multiple levels, previous anterior cervical fusion at the C7-T1 level.  No interval changes are noted.  CT of the thoracic spine with no fracture or lytic lesions seen.  There are multiple Noncalcified lung nodule suggesting pulmonary metastatic disease.  No spinal stenosis.  Positive DDD.  Metastatic lymphadenopathy seen in the mediastinum and possibly right hilum.  Small right pleural effusion.  CT lumbar spine no fracture, lytic or sclerotic lesions.  There is encroachment on neural foramina at multiple levels caused by bulging of annulus and facet hypertrophy.  No central spinal stenosis.  +5 mm nonobstructing right renal calculus.  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   Review of Systems: As mentioned in the history of present illness. All other systems reviewed and are negative.  Past Medical History:  Diagnosis Date   Anemia    Anxiety    Arthritis    Basal cell carcinoma (BCC) of forehead    CAD (coronary artery disease)    a. 10/2015 ant STEMI >> LHC with 3 v CAD; oLAD tx with POBA >> emergent CABG. b. Multiple evals since that time, early graft failure of SVG-RCA by cath 03/2016. c. 2/19 PCI/DES x1 to pRCA, normal EF.   Carotid artery disease ([place])    a. 40-59% BICA 02/2018.   Depression    Dyspnea    Ectopic atrial tachycardia    Esophageal reflux    eosinophil esophagitis   Family history of adverse  reaction to anesthesia    "sister has PONV" (06/21/2017)   Former tobacco use    Gout    Hepatitis C    "treated and cured" (06/21/2017)   High cholesterol    History of blood transfusion    History of kidney stones    Hypertension    Ischemic cardiomyopathy    a. EF 25-30% at intraop TEE 4/17  //  b.  Limited Echo 5/17 - EF 45-50%, mild ant HK. c. EF 55-65% by cath 09/2017.   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 HR dropping into 40s in 02/2016 -> BB reduced.   Stroke [facility]) 10/2016   "small one; sometimes my memory/cognitive issues" (06/21/2017)   Symptomatic hypotension    a. 02/2016 ER visit -> meds reduced.   Syncope    Wears dentures    Wears glasses    Past Surgical History:  Procedure Laterality Date   25 GAUGE PARS PLANA VITRECTOMY WITH 20 GAUGE MVR PORT  12/31/2020   Procedure: 25 GAUGE PARS PLANA VITRECTOMY WITH 20 GAUGE MVR PORT;  Surgeon: [name], MD;  Location: [place];  Service: Thoracic;;   ANTERIOR CERVICAL DECOMP/DISCECTOMY FUSION N/A 10/17/2018   Procedure: Anterior Cervical Decompression Fusion - Cervical seven -Thoracic one;  Surgeon: Consuella Lose, MD;  Location: [place];  Service: Neurosurgery;  Laterality: N/A;   BASAL CELL CARCINOMA EXCISION     "forehead   BIOPSY  07/20/2019   Procedure: BIOPSY;  Surgeon: Carol Ada, MD;  Location: [facility];  Service: Endoscopy;;   BIOPSY OF MEDIASTINAL MASS Right 07/29/2021   Procedure: RIGHT CHEST WALL MASS BIOPSY;  Surgeon: [name], MD;  Location: [place];  Service: Thoracic;  Laterality: Right;   BRONCHIAL BIOPSY  12/24/2020   Procedure: BRONCHIAL BIOPSIES;  Surgeon: Garner Nash, DO;  Location: [place] [facility];  Service: Pulmonary;;   BRONCHIAL BRUSHINGS  12/24/2020   Procedure: BRONCHIAL BRUSHINGS;  Surgeon: Garner Nash, DO;  Location: [place];  Service: Pulmonary;;   BRONCHIAL NEEDLE ASPIRATION BIOPSY  12/24/2020   Procedure: BRONCHIAL NEEDLE ASPIRATION BIOPSIES;  Surgeon: Garner Nash, DO;  Location: [place];  Service: Pulmonary;;   CARDIAC CATHETERIZATION N/A 11/28/2015   Procedure: Left Heart Cath and Coronary Angiography;  Surgeon:  [name], MD;  Location: [place] [facility];  Service: Cardiovascular;  Laterality: N/A;   CARDIAC CATHETERIZATION N/A 11/28/2015   Procedure: Coronary Balloon Angioplasty;  Surgeon: [name], MD;  Location: [place] [facility];  Service: Cardiovascular;  Laterality: N/A;  ostial LAD   CARDIAC CATHETERIZATION N/A 11/28/2015   Procedure: Coronary/Graft Angiography;  Surgeon: [name], MD;  Location: [place] [facility];  Service: Cardiovascular;  Laterality: N/A;  coronaries only    CARDIAC CATHETERIZATION N/A 04/21/2016   Procedure: Left Heart Cath and Coronary Angiography;  Surgeon: Wellington Hampshire, MD;  Location: [place] [facility];  Service: Cardiovascular;  Laterality: N/A;   CARDIAC CATHETERIZATION N/A 06/14/2016   Procedure: Left Heart Cath and Cors/Grafts Angiography;  Surgeon: Lorretta Harp, MD;  Location: [place] [facility];  Service: Cardiovascular;  Laterality: N/A;   CARDIAC CATHETERIZATION N/A 09/08/2016   Procedure: Left Heart Cath and Cors/Grafts Angiography;  Surgeon: Wellington Hampshire, MD;  Location: [place] [facility];  Service: Cardiovascular;  Laterality: N/A;  CARDIAC CATHETERIZATION     CORONARY ARTERY BYPASS GRAFT N/A 11/28/2015   Procedure: CORONARY ARTERY BYPASS GRAFTING (CABG) TIMES FIVE USING LEFT INTERNAL MAMMARY ARTERY AND RIGHT GREATER SAPHENOUS,VIEN HARVEATED BY ENDOVIEN, INTRAOPPRATIVE TEE;  Surgeon: Gaye Pollack, MD;  Location: [place];  Service: Open Heart Surgery;  Laterality: N/A;   CORONARY STENT INTERVENTION N/A 10/05/2017   Procedure: CORONARY STENT INTERVENTION;  Surgeon: [name], MD;  Location: [place] [facility];  Service: Cardiovascular;  Laterality: N/A;   ESOPHAGOGASTRODUODENOSCOPY (EGD) WITH PROPOFOL N/A 07/20/2019   Procedure: ESOPHAGOGASTRODUODENOSCOPY (EGD) WITH PROPOFOL;  Surgeon: Carol Ada, MD;  [facility];  Service: Endoscopy;  Laterality: N/A;   ESOPHAGOGASTRODUODENOSCOPY (EGD) WITH  PROPOFOL N/A 01/30/2021   Procedure: ESOPHAGOGASTRODUODENOSCOPY (EGD) WITH PROPOFOL;  Surgeon: Carol Ada, MD;  [facility];  Service: Endoscopy;  Laterality: N/A;   FIDUCIAL MARKER PLACEMENT  12/24/2020   Procedure: FIDUCIAL DYE MARKER PLACEMENT;  Surgeon: Garner Nash, DO;  Location: [place] [facility];  Service: Pulmonary;;   HUMERUS SURGERY Right 1969   "tumor inside bone; filled it w/bone chips"   INTERCOSTAL NERVE BLOCK Right 12/24/2020   Procedure: INTERCOSTAL NERVE BLOCK;  Surgeon: [name], MD;  Location: [place];  Service: Thoracic;  Laterality: Right;   IR IMAGING GUIDED PORT INSERTION  02/06/2021   LEFT HEART CATH AND CORS/GRAFTS ANGIOGRAPHY N/A 03/11/2017   Procedure: Left Heart Cath and Cors/Grafts Angiography;  Surgeon: Leonie Man, MD;  Location: [place] [facility];  Service: Cardiovascular;  Laterality: N/A;   LEFT HEART CATH AND CORS/GRAFTS ANGIOGRAPHY N/A 10/05/2017   Procedure: LEFT HEART CATH AND CORS/GRAFTS ANGIOGRAPHY;  Surgeon: [name], MD;  Location: [place] [facility];  Service: Cardiovascular;  Laterality: N/A;   LEFT HEART CATH AND CORS/GRAFTS ANGIOGRAPHY N/A 04/11/2019   Procedure: LEFT HEART CATH AND CORS/GRAFTS ANGIOGRAPHY;  Surgeon: [name], MD;  Location: [place] [facility];  Service: Cardiovascular;  Laterality: N/A;   NODE DISSECTION Right 12/24/2020   Procedure: NODE DISSECTION;  Surgeon: [name], MD;  Location: [place];  Service: Thoracic;  Laterality: Right;   PERIPHERAL VASCULAR CATHETERIZATION N/A 06/14/2016   Procedure: Lower Extremity Angiography;  Surgeon: Lorretta Harp, MD;  Location: [place] [facility];  Service: Cardiovascular;  Laterality: N/A;   VIDEO BRONCHOSCOPY WITH ENDOBRONCHIAL NAVIGATION Right 12/24/2020   Procedure: VIDEO BRONCHOSCOPY WITH ENDOBRONCHIAL NAVIGATION;  Surgeon: Garner Nash, DO;  Location: [place];  Service: Pulmonary;  Laterality: Right;   VIDEO BRONCHOSCOPY WITH  ENDOBRONCHIAL ULTRASOUND N/A 12/24/2020   Procedure: VIDEO BRONCHOSCOPY WITH ENDOBRONCHIAL ULTRASOUND;  Surgeon: Garner Nash, DO;  Location: [place];  Service: Pulmonary;  Laterality: N/A;   VIDEO BRONCHOSCOPY WITH INSERTION OF INTERBRONCHIAL VALVE (IBV) N/A 12/31/2020   Procedure: VIDEO BRONCHOSCOPY WITH INSERTION OF INTERBRONCHIAL VALVE (IBV).VALVE IN CARTRIDGE 26mm,9mm. CHEST TUBE PLACEMENT.;  Surgeon: [name], MD;  [facility];  Service: Thoracic;  Laterality: N/A;   VIDEO BRONCHOSCOPY WITH INSERTION OF INTERBRONCHIAL VALVE (IBV) N/A 07/29/2021   Procedure: VIDEO BRONCHOSCOPY WITH REMOVAL OF INTERBRONCHIAL VALVE (IBV);  Surgeon: [name], MD;  Location: [place];  Service: Thoracic;  Laterality: N/A;   Social History:  reports that he quit smoking about 6 years ago. His smoking use included cigarettes. He has a 33.00 pack-year smoking history. He has never used smokeless tobacco. He reports that he does not currently use alcohol. He reports that he does not use drugs.  Allergies  Allergen Reactions  Prednisone Other (See Comments)    States that this med makes him "crazy" Other reaction(s): Not available   Tetanus Toxoids Swelling and Other (See Comments)    Fever, Swelling of the arm    Wellbutrin [Bupropion] Other (See Comments)    Crazy thoughts, nightmares   Gabapentin     Other reaction(s): Unknown   Morphine Other (See Comments)   Tetanus Antitoxin Other (See Comments)   Varenicline Tartrate Other (See Comments)   Lorazepam Anxiety and Other (See Comments)    Makes him confused   Varenicline Other (See Comments)    Unpleasant dreams    Family History  Problem Relation Age of Onset   Lung cancer Mother    Heart Problems Father    Heart attack Father 54   Stroke Father    Heart failure Father    Heart attack Maternal Grandmother    Stroke Maternal Grandmother    Heart attack Paternal Uncle    Hypertension Brother    Autoimmune disease  Neg Hx     Prior to Admission medications   Medication Sig Start Date End Date Taking? Authorizing Provider  acetaminophen (TYLENOL) 325 MG tablet Take 1-2 tablets (325-650 mg total) by mouth every 4 (four) hours as needed for mild pain. 05/24/22   Setzer, Edman Circle, PA-C  benzonatate (TESSALON) 100 MG capsule TAKE 1 CAPSULE BY MOUTH THREE TIMES DAILY AS NEEDED FOR COUGH 07/28/22   Raulkar, Clide Deutscher, MD  bisacodyl (DULCOLAX) 5 MG EC tablet Take 1 tablet (5 mg total) by mouth daily as needed for moderate constipation. Patient taking differently: Take 5 mg by mouth in the morning, at noon, in the evening, and at bedtime. 07/12/22   Raulkar, Clide Deutscher, MD  docusate sodium (COLACE) 100 MG capsule Take 1 capsule (100 mg total) by mouth 3 (three) times daily. Patient taking differently: Take 100 mg by mouth daily as needed for moderate constipation. 05/24/22   Setzer, Edman Circle, PA-C  HYDROmorphone (DILAUDID) 8 MG tablet Take 1 tablet by mouth every 6 hours as needed for severe pain. 07/28/22   Pickenpack-Cousar, Carlena Sax, NP  lidocaine-prilocaine (EMLA) cream Apply to the Port-A-Cath site 30-60-minute before treatment 11/09/21   Curt Bears, MD  magnesium gluconate (MAGONATE) 500 MG tablet Take 0.5 tablets (250 mg total) by mouth at bedtime. Patient taking differently: Take 250 mg by mouth daily as needed. 05/24/22   Setzer, Edman Circle, PA-C  megestrol (MEGACE) 40 MG/ML suspension SHAKE LIQUID AND TAKE 10 ML(400 MG) BY MOUTH TWICE DAILY 07/28/22   Raulkar, Clide Deutscher, MD  midodrine (PROAMATINE) 10 MG tablet Take 1 tablet (10 mg total) by mouth 3 (three) times daily with meals. 07/02/22   Raulkar, Clide Deutscher, MD  morphine (MS CONTIN) 60 MG 12 hr tablet Take 1 tablet (60 mg total) by mouth every 12 (twelve) hours. 08/12/22   Pickenpack-Cousar, Carlena Sax, NP  naloxone (NARCAN) nasal spray 4 mg/0.1 mL Place 1 spray intranasally into 1 nostril. If desired response not obtained after 2 or 3 minutes, may repeat 1  spray into other nostril 05/24/22   Setzer, Edman Circle, PA-C  ondansetron (ZOFRAN) 8 MG tablet Take 1 tablet by mouth every 8 hours as needed for nausea or vomiting. Starting 3 days after chemotherapy 01/11/22   Pickenpack-Cousar, Carlena Sax, NP  polyethylene glycol (MIRALAX / GLYCOLAX) 17 g packet Take 17 g by mouth daily. 05/25/22   Setzer, Edman Circle, PA-C  primidone (MYSOLINE) 50 MG tablet Take 1 tablet (50 mg  total) by mouth daily. 07/12/22   Raulkar, Clide Deutscher, MD  prochlorperazine (COMPAZINE) 10 MG tablet Take 10 mg by mouth every 6 (six) hours as needed for nausea or vomiting. 04/26/22   [provider]  traZODone (DESYREL) 50 MG tablet Take 1 tablet (50 mg total) by mouth at bedtime. 07/02/22   Raulkar, Clide Deutscher, MD  Vitamin D, Ergocalciferol, (DRISDOL) 1.25 MG (50000 UNIT) CAPS capsule Take 1 capsule (50,000 Units total) by mouth every 7 (seven) days. 06/22/22   Raulkar, Clide Deutscher, MD  XARELTO 20 MG TABS tablet Take 1 tablet (20 mg total) by mouth every evening. 07/30/22   [name], MD    Physical Exam: Vitals:   08/01/22 1040 08/01/22 1041 08/01/22 1054 08/01/22 1415  BP: 112/72   120/82  Pulse: (!) 113   (!) 114  Resp: 16   20  Temp: 99.3 F (37.4 C)     TempSrc: Oral     SpO2: 100% 96%  99%  Weight:   57.2 kg   Height:   5\' 9"  (1.753 m)    Physical Exam Vitals and nursing note reviewed.  Constitutional:      General: He is awake. He is in acute distress.     Appearance: He is underweight. He is ill-appearing.     Comments: Positive acute distress due to pain.  HENT:     Head: Normocephalic.     Nose: No rhinorrhea.     Mouth/Throat:     Mouth: Mucous membranes are dry.  Eyes:     General: No scleral icterus.    Pupils: Pupils are equal, round, and reactive to light.  Neck:     Vascular: No JVD.  Cardiovascular:     Rate and Rhythm: Normal rate and regular rhythm.     Heart sounds: S1 normal and S2 normal.  Pulmonary:     Effort: Pulmonary effort is  normal.     Breath sounds: Normal breath sounds. No wheezing, rhonchi or rales.  Abdominal:     General: Bowel sounds are normal. There is no distension.     Palpations: Abdomen is soft.     Tenderness: There is no abdominal tenderness. There is no guarding.  Musculoskeletal:     Cervical back: Neck supple.     Right lower leg: No edema.     Left lower leg: No edema.  Skin:    General: Skin is warm and dry.  Neurological:     General: No focal deficit present.     Mental Status: He is alert and oriented to person, place, and time.  Psychiatric:        Mood and Affect: Mood normal.        Behavior: Behavior normal. Behavior is cooperative.   Data Reviewed:  There are no new results to review at this time.  12/27/2020 echocardiogram IMPRESSIONS    1. Left ventricular ejection fraction, by estimation, is 55 to 60%. The  left ventricle has normal function. The left ventricle has no regional  wall motion abnormalities. Left ventricular diastolic parameters are  consistent with Grade I diastolic  dysfunction (impaired relaxation).   2. Right ventricular systolic function is normal. The right ventricular  size is normal.   3. The mitral valve is normal in structure. No evidence of mitral valve  regurgitation. No evidence of mitral stenosis.   4. The aortic valve is normal in structure. Aortic valve regurgitation is  not visualized. No aortic stenosis is present.  5. The inferior vena cava is normal in size with greater than 50%  respiratory variability, suggesting right atrial pressure of 3 mmHg.   Assessment and Plan: Principal Problem:   Cancer associated pain Secondary to:   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[place]) Presenting as:   Intractable back pain Observation/telemetry. Continue MS Contin 60 mg p.o. every 12 hours. Continue hydromorphone 8 mg p.o. every 6 hours as needed. Hydromorphone 1 mg IVP every 2 hours while in the hospital. Monitor ETCO2 level.  Active  Problems:   Depression Continue trazodone at bedtime.    CAD of autologous artery bypass graft without angina On Xarelto. No beta-blockers due to hypotension tendency. He is currently on midodrine 10 mg p.o. 3 times daily.    Gastroesophageal reflux disease Famotidine 20 mg nightly while in the hospital.    AF (paroxysmal atrial fibrillation) (HCC) CHA2DS2-VASc Score of at least 3. Not needing rate control meds. Continue Xarelto.    Constipation Continue stool softeners.    Advance Care Planning:   Code Status: DNR.  Consults:   Family Communication: His spouse was at bedside.  Severity of Illness: The appropriate patient status for this patient is INPATIENT. Inpatient status is judged to be reasonable and necessary in order to provide the required intensity of service to ensure the patient's safety. The patient's presenting symptoms, physical exam findings, and initial radiographic and laboratory data in the context of their chronic comorbidities is felt to place them at high risk for further clinical deterioration. Furthermore, it is not anticipated that the patient will be medically stable for discharge from the hospital within 2 midnights of admission.   * I certify that at the point of admission it is my clinical judgment that the patient will require inpatient hospital care spanning beyond 2 midnights from the point of admission due to high intensity of service, high risk for further deterioration and high frequency of surveillance required.*  Author: Reubin Milan, MD 08/01/2022 4:07 PM  For on call review [email or web address].   This document was prepared using Dragon voice recognition software and may contain some unintended transcription errors.

## 2022-08-01 NOTE — ED Provider Notes (Signed)
[facility] DEPT Provider Note   CSN: [id number] Arrival date & time: 08/01/22  1032     History Past medical history includes stage IV lung cancer with known brain mets getting brain radiation as well as a lytic lesion to one of his rib cage wall. He is s/p lobectomy and chemotherapy. He is now undergoing immunotherapy through [facility] oncology. He sees palliative care through [facility] who manages his pain.   He also has history of hypertension, CAD, prior history of CVA, kidney stones, ischemic cardiomyopathy with last EF 55 to 60% in 2022  Chief Complaint  Patient presents with   Back Pain   Nausea    [name] is a 66 y.o. male.  Patient presents with 3 days of gradually worsening midline thoracic back pain that radiates up into his cervical spine as well as his lower back area.  Also radiates around into his upper abdomen/lower chest wall.  He says he is never had pain like this before.  He is on home 8 mg of p.o. Dilaudid every 6 hours as well as 60 mg of extended release morphine every 12 hours.  He has been taking this around-the-clock without any relief of symptoms.  He is currently seeing palliative care who is managing his pain.  He follows with [facility] for his cancer  He has associated nausea. Says that this pain is not like prior kidney stones. It feels like worsening pain of his chronic cancer pain.  Denies chest pain, shortness of breath, numbness or weakness in extremities, fevers, dizziness, hematuria, dysuria, change in urination, diarrhea, constipation, vomiting.    Back Pain Associated symptoms: abdominal pain        Home Medications Prior to Admission medications   Medication Sig Start Date End Date Taking? Authorizing Provider  acetaminophen (TYLENOL) 325 MG tablet Take 1-2 tablets (325-650 mg total) by mouth every 4 (four) hours as needed for mild pain. 05/24/22   Setzer, Edman Circle, PA-C  benzonatate (TESSALON) 100 MG capsule  TAKE 1 CAPSULE BY MOUTH THREE TIMES DAILY AS NEEDED FOR COUGH 07/28/22   Raulkar, Clide Deutscher, MD  bisacodyl (DULCOLAX) 5 MG EC tablet Take 1 tablet (5 mg total) by mouth daily as needed for moderate constipation. Patient taking differently: Take 5 mg by mouth in the morning, at noon, in the evening, and at bedtime. 07/12/22   Raulkar, Clide Deutscher, MD  docusate sodium (COLACE) 100 MG capsule Take 1 capsule (100 mg total) by mouth 3 (three) times daily. Patient taking differently: Take 100 mg by mouth daily as needed for moderate constipation. 05/24/22   Setzer, Edman Circle, PA-C  HYDROmorphone (DILAUDID) 8 MG tablet Take 1 tablet by mouth every 6 hours as needed for severe pain. 07/28/22   Pickenpack-Cousar, Carlena Sax, NP  lidocaine-prilocaine (EMLA) cream Apply to the Port-A-Cath site 30-60-minute before treatment 11/09/21   Curt Bears, MD  magnesium gluconate (MAGONATE) 500 MG tablet Take 0.5 tablets (250 mg total) by mouth at bedtime. Patient taking differently: Take 250 mg by mouth daily as needed. 05/24/22   Setzer, Edman Circle, PA-C  megestrol (MEGACE) 40 MG/ML suspension SHAKE LIQUID AND TAKE 10 ML(400 MG) BY MOUTH TWICE DAILY 07/28/22   Raulkar, Clide Deutscher, MD  midodrine (PROAMATINE) 10 MG tablet Take 1 tablet (10 mg total) by mouth 3 (three) times daily with meals. 07/02/22   Raulkar, Clide Deutscher, MD  morphine (MS CONTIN) 60 MG 12 hr tablet Take 1 tablet (60 mg total) by  mouth every 12 (twelve) hours. 08/12/22   Pickenpack-Cousar, Carlena Sax, NP  naloxone (NARCAN) nasal spray 4 mg/0.1 mL Place 1 spray intranasally into 1 nostril. If desired response not obtained after 2 or 3 minutes, may repeat 1 spray into other nostril 05/24/22   Setzer, Edman Circle, PA-C  ondansetron (ZOFRAN) 8 MG tablet Take 1 tablet by mouth every 8 hours as needed for nausea or vomiting. Starting 3 days after chemotherapy 01/11/22   Pickenpack-Cousar, Carlena Sax, NP  polyethylene glycol (MIRALAX / GLYCOLAX) 17 g packet Take 17 g by mouth  daily. 05/25/22   Setzer, Edman Circle, PA-C  primidone (MYSOLINE) 50 MG tablet Take 1 tablet (50 mg total) by mouth daily. 07/12/22   Raulkar, Clide Deutscher, MD  prochlorperazine (COMPAZINE) 10 MG tablet Take 10 mg by mouth every 6 (six) hours as needed for nausea or vomiting. 04/26/22   [provider]  traZODone (DESYREL) 50 MG tablet Take 1 tablet (50 mg total) by mouth at bedtime. 07/02/22   Raulkar, Clide Deutscher, MD  Vitamin D, Ergocalciferol, (DRISDOL) 1.25 MG (50000 UNIT) CAPS capsule Take 1 capsule (50,000 Units total) by mouth every 7 (seven) days. 06/22/22   Raulkar, Clide Deutscher, MD  XARELTO 20 MG TABS tablet Take 1 tablet (20 mg total) by mouth every evening. 07/30/22   [name], MD      Allergies    Prednisone, Tetanus toxoids, Wellbutrin [bupropion], Gabapentin, Morphine, Tetanus antitoxin, Varenicline tartrate, Lorazepam, and Varenicline    Review of Systems   Review of Systems  Gastrointestinal:  Positive for abdominal pain and nausea.  Musculoskeletal:  Positive for back pain.  All other systems reviewed and are negative.   Physical Exam Updated Vital Signs BP 120/82   Pulse (!) 114   Temp 99.3 F (37.4 C) (Oral)   Resp 20   Ht 5\' 9"  (1.753 m)   Wt 57.2 kg   SpO2 99%   BMI 18.61 kg/m  Physical Exam Vitals and nursing note reviewed.  Constitutional:      General: He is not in acute distress.    Appearance: Normal appearance. He is well-developed. He is not ill-appearing, toxic-appearing or diaphoretic.  HENT:     Head: Normocephalic and atraumatic.     Nose: No nasal deformity.     Mouth/Throat:     Lips: Pink. No lesions.  Eyes:     General: Gaze aligned appropriately. No scleral icterus.       Right eye: No discharge.        Left eye: No discharge.     Conjunctiva/sclera: Conjunctivae normal.     Right eye: Right conjunctiva is not injected. No exudate or hemorrhage.    Left eye: Left conjunctiva is not injected. No exudate or  hemorrhage. Pulmonary:     Effort: Pulmonary effort is normal. No respiratory distress.  Abdominal:     General: Abdomen is flat. There is no distension.     Palpations: Abdomen is soft.     Tenderness: There is abdominal tenderness. There is no right CVA tenderness, left CVA tenderness, guarding or rebound.     Comments: No pulsatile mass, upper abdominal tenderness noted  Musculoskeletal:     Comments: + C, T, and L midline tenderness with limited paraspinal muscular tenderness  Skin:    General: Skin is warm and dry.  Neurological:     Mental Status: He is alert and oriented to person, place, and time.     Comments: 5/5 motor strength  in all ext. Sensation intact.   Psychiatric:        Mood and Affect: Mood normal.        Speech: Speech normal.        Behavior: Behavior normal. Behavior is cooperative.     ED Results / Procedures / Treatments   Labs (all labs ordered are listed, but only abnormal results are displayed) Labs Reviewed  CBC WITH DIFFERENTIAL/PLATELET - Abnormal; Notable for the following components:      Result Value   RBC 4.09 (*)    Hemoglobin 12.0 (*)    HCT 36.5 (*)    All other components within normal limits  COMPREHENSIVE METABOLIC PANEL - Abnormal; Notable for the following components:   CO2 19 (*)    Albumin 3.2 (*)    All other components within normal limits  URINALYSIS, ROUTINE W REFLEX MICROSCOPIC - Abnormal; Notable for the following components:   Protein, ur TRACE (*)    All other components within normal limits  URINALYSIS, MICROSCOPIC (REFLEX) - Abnormal; Notable for the following components:   Bacteria, UA FEW (*)    All other components within normal limits  LIPASE, BLOOD    EKG None  Radiology DG Chest Portable 1 View  Result Date: 08/01/2022 CLINICAL DATA:  History of lung cancer with metastasis. EXAM: PORTABLE CHEST 1 VIEW COMPARISON:  July 06, 2022 FINDINGS: The heart size and mediastinal contours are stable. Right central  venous line is unchanged. Multiple bilateral pulmonary nodules are identified consistent with metastasis. Small right pleural effusion is noted. The visualized skeletal structures are stable. IMPRESSION: Multiple bilateral pulmonary nodules consistent with metastasis. Small right pleural effusion. Electronically Signed   By: Abelardo Diesel M.D.   On: 08/01/2022 15:49   CT Lumbar Spine Wo Contrast  Result Date: 08/01/2022 CLINICAL DATA:  Low back pain, metastatic carcinoma EXAM: CT LUMBAR SPINE WITHOUT CONTRAST TECHNIQUE: Multidetector CT imaging of the lumbar spine was performed without intravenous contrast administration. Multiplanar CT image reconstructions were also generated. RADIATION DOSE REDUCTION: This exam was performed according to the departmental dose-optimization program which includes automated exposure control, adjustment of the mA and/or kV according to patient size and/or use of iterative reconstruction technique. COMPARISON:  CT done on 05/04/2022 FINDINGS: Segmentation: There are 5 non-rib-bearing vertebrae in the lumbar region. Alignment: Alignment of posterior margins of vertebral bodies is unremarkable. Vertebrae: There is defect in the left pars interarticularis of L5 vertebra. Paraspinal and other soft tissues: Paraspinal soft tissues are unremarkable. In previous MR thoracic spine done on 05/07/2022, enhancing lesion was noted in the body of L1 vertebra. In the CT, there is no definite demonstrable lytic or sclerotic lesions in the bony structures. Arterial calcifications are seen in abdominal aorta and its major branches. There is ectasia of right common iliac artery measuring 1.9 cm. There is 5 mm calculus in the right kidney. There is no hydronephrosis in the visualized portions of the kidneys. Disc levels: There is disc space narrowing from L1-L4 levels. There is encroachment of neural foramina by bony spurs and bulging of annulus from L1 to S1 levels. IMPRESSION: No recent fracture  is seen. There are no focal lytic or sclerotic lesions. Alignment of posterior margins of vertebral bodies appears normal. There is encroachment of neural foramina at multiple levels caused by bulging of annulus and facet hypertrophy. There is no central spinal stenosis. There is 5 mm nonobstructing right renal calculus. 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 Electronically  Signed   By: Elmer Picker M.D.   On: 08/01/2022 15:01   CT Thoracic Spine Wo Contrast  Result Date: 08/01/2022 CLINICAL DATA:  Metastatic disease, pain EXAM: CT THORACIC SPINE WITHOUT CONTRAST TECHNIQUE: Multidetector CT images of the thoracic were obtained using the standard protocol without intravenous contrast. RADIATION DOSE REDUCTION: This exam was performed according to the departmental dose-optimization program which includes automated exposure control, adjustment of the mA and/or kV according to patient size and/or use of iterative reconstruction technique. COMPARISON:  MR thoracic spine done on 05/07/2022 FINDINGS: Alignment: Alignment of posterior margins of vertebral bodies is unremarkable. Vertebrae: No recent fracture is seen. There are no focal lytic or sclerotic lesions. There is anterior surgical fusion at C7-T1 level. Paraspinal and other soft tissues: There is no central spinal stenosis. There are multiple noncalcified nodules in the visualized lung fields measuring up to 2.1 cm in diameter consistent with pulmonary metastatic disease. Enlarged lymph nodes are seen in mediastinum suggesting metastatic lymphadenopathy. Coronary artery calcifications are seen. Scattered calcifications are seen in thoracic aorta. Small right pleural effusion is seen. Disc levels: There is no significant encroachment of neural foramina. Degenerative changes are noted with anterior bony spurs at multiple levels. IMPRESSION: No recent fracture is seen in thoracic spine. No focal lytic or sclerotic  lesions are seen. There is no central spinal stenosis or significant encroachment of neural foramina. Degenerative changes are noted with anterior bony spurs. There are multiple noncalcified lung nodules suggesting pulmonary metastatic disease. Metastatic lymphadenopathy is seen in mediastinum and possibly right hilum. Small right pleural effusion. Electronically Signed   By: Elmer Picker M.D.   On: 08/01/2022 14:52   CT Cervical Spine Wo Contrast  Result Date: 08/01/2022 CLINICAL DATA:  Metastatic disease, neck pain EXAM: CT CERVICAL SPINE WITHOUT CONTRAST TECHNIQUE: Multidetector CT imaging of the cervical spine was performed without intravenous contrast. Multiplanar CT image reconstructions were also generated. RADIATION DOSE REDUCTION: This exam was performed according to the departmental dose-optimization program which includes automated exposure control, adjustment of the mA and/or kV according to patient size and/or use of iterative reconstruction technique. COMPARISON:  05/04/2022 FINDINGS: Alignment: There is minimal 1-2 mm anterolisthesis at C3-C4 level. This finding has not changed. Skull base and vertebrae: No fracture is seen. There is anterior surgical fusion at C7-T1 level. Degenerative changes are noted in facet joints at multiple levels. Soft tissues and spinal canal: There is no central spinal stenosis. Disc levels: There is encroachment of neural foramina by bony spurs and facet hypertrophy from C2-C4 levels. There is a minimal encroachment of neural foramina at C5-C6 level. Upper chest: There are a few noncalcified nodules measuring up to 6 mm in size in left upper lung field. Other: There is slightly inhomogeneous attenuation in thyroid without dominant nodules in the visualized portions of thyroid. IMPRESSION: No recent fracture is seen in cervical spine. There are no focal lytic or sclerotic lesions. Cervical spondylosis with encroachment of neural foramina from C2-C6 levels. There  is previous anterior surgical fusion at C7-T1 level. No significant interval changes are noted. Electronically Signed   By: Elmer Picker M.D.   On: 08/01/2022 14:40    Procedures Procedures   Medications Ordered in ED Medications  rivaroxaban (XARELTO) tablet 20 mg (has no administration in time range)  traZODone (DESYREL) tablet 50 mg (has no administration in time range)  prochlorperazine (COMPAZINE) tablet 10 mg (has no administration in time range)  primidone (MYSOLINE) tablet 50 mg (has no administration in time  range)  polyethylene glycol (MIRALAX / GLYCOLAX) packet 17 g (has no administration in time range)  midodrine (PROAMATINE) tablet 10 mg (has no administration in time range)  magnesium gluconate (MAGONATE) tablet 250 mg (has no administration in time range)  morphine (MS CONTIN) 12 hr tablet 60 mg (has no administration in time range)  HYDROmorphone (DILAUDID) tablet 8 mg (has no administration in time range)  bisacodyl (DULCOLAX) EC tablet 5 mg (has no administration in time range)  senna-docusate (Senokot-S) tablet 1 tablet (has no administration in time range)  acetaminophen (TYLENOL) tablet 650 mg (has no administration in time range)    Or  acetaminophen (TYLENOL) suppository 650 mg (has no administration in time range)  ondansetron (ZOFRAN) tablet 4 mg (has no administration in time range)    Or  ondansetron (ZOFRAN) injection 4 mg (has no administration in time range)  HYDROmorphone (DILAUDID) injection 1 mg (has no administration in time range)  HYDROmorphone (DILAUDID) injection 1 mg (1 mg Intravenous Given 08/01/22 1224)  HYDROmorphone (DILAUDID) injection 1 mg (1 mg Intravenous Given 08/01/22 1238)  HYDROmorphone (DILAUDID) injection 2 mg (2 mg Intravenous Given 08/01/22 1526)    ED Course/ Medical Decision Making/ A&P Clinical Course as of 08/01/22 1615  Sun Aug 01, 2022  1602 Dr. Olevia Bowens to accept pateint to service.  [GL]    Clinical Course User  Index [GL] Sherre Poot Adora Fridge, PA-C                            Medical Decision Making Amount and/or Complexity of Data Reviewed Labs: ordered. Radiology: ordered.  Risk Prescription drug management. Decision regarding hospitalization.    MDM  This is a 66 y.o. male who presents to the ED with worsening midline back pain for the past three days refractory to home pain medications The differential of this patient includes but is not limited to compression fracture, spinal mets, pancreatitis, PUD, acute on chronic pain crisis, etc.   Initial Impression  Chronically ill appearing. No acute distress Tachycardic to 113, other vitals are stable.  He has notable midline spinal tenderness throughout entire spine without much muscular pains. Will plan to order CT spine to r/o new compression fracture and mets. He also has notable thoracic rib cage tenderness.   I personally ordered, reviewed, and interpreted all laboratory work and imaging and agree with radiologist interpretation. Results interpreted below: No leukocytosis noted, hemoglobin 12.0 and stable from baseline, renal function normal, LFTs normal, electrolytes normal, lipase normal, urinalysis without any signs of urinary tract infection.  Chest x-ray with no new changes but consistent with metastasis  CT cervical, thoracic, and lumbar with no lytic lesions or compression fractures noted.  Assessment/Plan:  I discussed results with patient. There were no new identifiable lytic lesions or compression fractures on todays imaging. I suspect this is a pain exacerbation from chronic cancer related pain. He has known lytic lesions of his rib cage which is where a lot of his pain is. He required admission for same last February at our facility. He has limited improvement in symptoms today after multiple doses of dilaudid. Patient and wife do not feel comfortable bringing home and are asking for admission consideration for refractory pain  management.    Charting Requirements Additional history is obtained from:  Independent historian and Spouse/Significant Other External Records from outside source obtained and reviewed including: [facility] oncology notes, recent palliative care note, admission from February 2023 for refractory pain  Social Determinants of Health:  none Pertinant PMH that complicates patient's illness: stage 4 lung cancer  Patient Care Problems that were addressed during this visit: - Cancer related breatkthrough pain: Chronic illness with exacerbation, progression, or side effects of treatment - Stage 4 Malignant Lung Neoplasm: Chronic illness with exacerbation, progression, or side effects of treatment This patient was maintained on a cardiac monitor/telemetry. I personally viewed and interpreted the cardiac monitor which reveals an underlying rhythm of ST Medications given in ED: 4 mg Dilaudid Reevaluation of the patient after these medicines showed that the patient improved I have reviewed home medications and made changes accordingly.  Critical Care Interventions: n/a Consultations: THS Disposition: admit  This is a supervised visit with my attending physician, Dr. Zenia Resides. We have discussed this patient and they have altered the plan as needed.  Portions of this note were generated with [employer]. Dictation errors may occur despite best attempts at proofreading.    Final Clinical Impression(s) / ED Diagnoses Final diagnoses:  Cancer-related breakthrough pain  Stage 4 malignant neoplasm of lung, unspecified laterality [facility])    Rx / DC Orders ED Discharge Orders     None         Adolphus Birchwood, PA-C 08/01/22 1615    Lacretia Leigh, MD 08/03/22 1329

## 2022-08-02 ENCOUNTER — Ambulatory Visit: Payer: Medicare Other | Admitting: Physical Therapy

## 2022-08-02 DIAGNOSIS — M549 Dorsalgia, unspecified: Secondary | ICD-10-CM | POA: Diagnosis not present

## 2022-08-02 DIAGNOSIS — C7951 Secondary malignant neoplasm of bone: Secondary | ICD-10-CM | POA: Insufficient documentation

## 2022-08-02 LAB — COMPREHENSIVE METABOLIC PANEL
ALT: 16 U/L (ref 0–44)
AST: 20 U/L (ref 15–41)
Albumin: 3.6 g/dL (ref 3.5–5.0)
Alkaline Phosphatase: 63 U/L (ref 38–126)
Anion gap: 5 (ref 5–15)
BUN: 21 mg/dL (ref 8–23)
CO2: 25 mmol/L (ref 22–32)
Calcium: 10.9 mg/dL — ABNORMAL HIGH (ref 8.9–10.3)
Chloride: 106 mmol/L (ref 98–111)
Creatinine, Ser: 0.97 mg/dL (ref 0.61–1.24)
GFR, Estimated: >60 mL/min (ref >60)
Glucose, Bld: 95 mg/dL (ref 70–99)
Potassium: 4.8 mmol/L (ref 3.5–5.1)
Sodium: 136 mmol/L (ref 135–145)
Total Bilirubin: 0.6 mg/dL (ref 0.3–1.2)
Total Protein: 7.6 g/dL (ref 6.5–8.1)

## 2022-08-02 LAB — CBC
HCT: 34.9 % — ABNORMAL LOW (ref 39.0–52.0)
Hemoglobin: 11.1 g/dL — ABNORMAL LOW (ref 13.0–17.0)
MCH: 28.8 pg (ref 26.0–34.0)
MCHC: 31.8 g/dL (ref 30.0–36.0)
MCV: 90.4 fL (ref 80.0–100.0)
Platelets: 237 10*3/uL (ref 150–400)
RBC: 3.86 MIL/uL — ABNORMAL LOW (ref 4.22–5.81)
RDW: 14.7 % (ref 11.5–15.5)
WBC: 5.2 10*3/uL (ref 4.0–10.5)
nRBC: 0 % (ref 0.0–0.2)

## 2022-08-02 MED ORDER — HYDROMORPHONE HCL 1 MG/ML IJ SOLN: 1.0000 mg | Freq: Once | INTRAMUSCULAR | Status: DC

## 2022-08-02 MED ORDER — HYDROMORPHONE HCL 1 MG/ML IJ SOLN
1.0000 mg | INTRAMUSCULAR | Status: DC | PRN
  Administered 2022-08-02 – 2022-08-04 (×5): 1 mg via INTRAVENOUS
  Filled 2022-08-02 (×6): qty 1

## 2022-08-02 MED ORDER — HYDROMORPHONE HCL 2 MG/ML IJ SOLN
2.0000 mg | Freq: Once | INTRAMUSCULAR | Status: AC
  Administered 2022-08-02: 2 mg via INTRAVENOUS
  Filled 2022-08-02: qty 1

## 2022-08-02 MED ORDER — DEXTROMETHORPHAN POLISTIREX ER 30 MG/5ML PO SUER
10.0000 mg | Freq: Two times a day (BID) | ORAL | Status: DC | PRN
  Administered 2022-08-02 – 2022-08-04 (×2): 10.2 mg via ORAL
  Filled 2022-08-02 (×3): qty 5

## 2022-08-02 NOTE — Progress Notes (Signed)
PROGRESS NOTE    [name]  MCN:[id number] DOB: [birth year] DOA: 08/01/2022 PCP: [name], MD     Brief Narrative:   [name] is a 66 y.o. 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   New events last 24 hours / Subjective: Patient has required total of 11 mg IV Dilaudid in the last 24 hours.  He states that his pain is better controlled and wants to go home, however wife feels differently, states that he will need to be improved before she can manage his pain at home.  We discussed palliative care consultation for symptom management prior to discharge.  Assessment & Plan:   Principal Problem:   Intractable back pain Active Problems:   Depression   CAD of autologous artery bypass graft without angina   Gastroesophageal reflux disease   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HCC)   Metastasis to brain (HCC)   AF (paroxysmal atrial fibrillation) (HCC)   Cancer associated pain   Constipation   Hypotension, postural   Metastatic cancer to spine (HCC)   Intractable back pain in setting of metastatic lung cancer to brain and bone -Continue home MS Contin 60 mg every 12 hours, oral Dilaudid 8 mg every 6 hours as needed.  Also added IV Dilaudid for breakthrough pain to alternate with p.o. -Palliative  care consulted for symptom management  Paroxysmal A-fib -Xarelto  Hypotension -Midodrine  Depression -Trazodone  Cognitive decline -Seen by Dr. Leta Baptist, neurology -Thought to be secondary to CNS metastatic disease, radiation side effect  Essential tremor -Primidone  DVT prophylaxis:   rivaroxaban (XARELTO) tablet 20 mg  Code Status: DNR Family Communication: No family at bedside, updated wife over the phone Disposition Plan:  Status is: Observation The patient will require care spanning > 2 midnights and should be moved to inpatient because: Requiring IV pain control   Antimicrobials:  Anti-infectives (From admission, onward)    None        Objective: Vitals:   08/01/22 1805 08/01/22 2149 08/02/22 0230 08/02/22 0448  BP: 129/78 129/83 123/81 128/78  Pulse: 97 95 87 92  Resp: 20 17 17 17   Temp: 98.5 F (36.9 C) 98.7 F (37.1 C) 98.4 F (36.9 C) 98.7 F (37.1 C)  TempSrc: Oral Oral Oral Oral  SpO2: 99% 100% 100% 100%  Weight:      Height:        Intake/Output Summary (Last 24 hours) at 08/02/2022 1402 Last data filed at 08/02/2022 1308 Gross per 24 hour  Intake --  Output 50 ml  Net -50 ml   Filed Weights   08/01/22 1054  Weight: 57.2 kg    Examination:  General exam: Appears calm, frail appearing  Respiratory system: Clear to auscultation. Respiratory effort normal. Cardiovascular system: S1 & S2 heard. No murmurs. No pedal edema. Gastrointestinal system: Abdomen is nondistended, soft Central nervous system: Alert and oriented Extremities: Symmetric in appearance  Skin: No rashes,  lesions or ulcers on exposed skin   Data Reviewed: I have personally reviewed following labs and imaging studies  CBC: Recent Labs  Lab 08/01/22 1246 08/02/22 0439  WBC 7.6 5.2  NEUTROABS 6.1  --   HGB 12.0* 11.1*  HCT 36.5* 34.9*  MCV 89.2 90.4  PLT 308 841   Basic Metabolic Panel: Recent Labs  Lab 08/01/22 1246 08/02/22 0439  NA 136 136  K 3.7  4.8  CL 109 106  CO2 19* 25  GLUCOSE 93 95  BUN 17 21  CREATININE 0.84 0.97  CALCIUM 9.9 10.9*   GFR: Estimated Creatinine Clearance: 59.8 mL/min (by C-G formula based on SCr of 0.97 mg/dL). Liver Function Tests: Recent Labs  Lab 08/01/22 1246 08/02/22 0439  AST 21 20  ALT 15 16  ALKPHOS 63 63  BILITOT 0.7 0.6  PROT 7.4 7.6  ALBUMIN 3.2* 3.6   Recent Labs  Lab 08/01/22 1246  LIPASE 29   No results for input(s): "AMMONIA" in the last 168 hours. Coagulation Profile: No results for input(s): "INR", "PROTIME" in the last 168 hours. Cardiac Enzymes: No results for input(s): "CKTOTAL", "CKMB", "CKMBINDEX", "TROPONINI" in the last 168 hours. BNP (last 3 results) No results for input(s): "PROBNP" in the last 8760 hours. HbA1C: No results for input(s): "HGBA1C" in the last 72 hours. CBG: No results for input(s): "GLUCAP" in the last 168 hours. Lipid Profile: No results for input(s): "CHOL", "HDL", "LDLCALC", "TRIG", "CHOLHDL", "LDLDIRECT" in the last 72 hours. Thyroid Function Tests: No results for input(s): "TSH", "T4TOTAL", "FREET4", "T3FREE", "THYROIDAB" in the last 72 hours. Anemia Panel: No results for input(s): "VITAMINB12", "FOLATE", "FERRITIN", "TIBC", "IRON", "RETICCTPCT" in the last 72 hours. Sepsis Labs: No results for input(s): "PROCALCITON", "LATICACIDVEN" in the last 168 hours.  No results found for this or any previous visit (from the past 240 hour(s)).    Radiology Studies: DG Chest Portable 1 View  Result Date: 08/01/2022 CLINICAL DATA:  History of lung cancer with metastasis. EXAM: PORTABLE CHEST 1 VIEW COMPARISON:  July 06, 2022 FINDINGS: The heart size and mediastinal contours are stable. Right central venous line is unchanged. Multiple bilateral pulmonary nodules are identified consistent with metastasis. Small right pleural effusion is noted. The visualized skeletal structures are stable. IMPRESSION: Multiple bilateral pulmonary nodules consistent  with metastasis. Small right pleural effusion. Electronically Signed   By: Abelardo Diesel M.D.   On: 08/01/2022 15:49   CT Lumbar Spine Wo Contrast  Result Date: 08/01/2022 CLINICAL DATA:  Low back pain, metastatic carcinoma EXAM: CT LUMBAR SPINE WITHOUT CONTRAST TECHNIQUE: Multidetector CT imaging of the lumbar spine was performed without intravenous contrast administration. Multiplanar CT image reconstructions were also generated. RADIATION DOSE REDUCTION: This exam was performed according to the departmental dose-optimization program which includes automated exposure control, adjustment of the mA and/or kV according to patient size and/or use of iterative reconstruction technique. COMPARISON:  CT done on 05/04/2022 FINDINGS: Segmentation: There are 5 non-rib-bearing vertebrae in the lumbar region. Alignment: Alignment of posterior margins of vertebral bodies is unremarkable. Vertebrae: There is defect in the left pars interarticularis of L5 vertebra. Paraspinal and other soft tissues: Paraspinal soft tissues are unremarkable. In previous MR thoracic spine done on 05/07/2022, enhancing lesion was noted in the body of L1 vertebra. In the CT, there is no definite demonstrable lytic or sclerotic lesions in the bony structures. Arterial calcifications are seen in abdominal aorta and its major branches. There is ectasia of right common iliac artery measuring 1.9 cm.  There is 5 mm calculus in the right kidney. There is no hydronephrosis in the visualized portions of the kidneys. Disc levels: There is disc space narrowing from L1-L4 levels. There is encroachment of neural foramina by bony spurs and bulging of annulus from L1 to S1 levels. IMPRESSION: No recent fracture is seen. There are no focal lytic or sclerotic lesions. Alignment of posterior margins of vertebral bodies appears normal. There is encroachment of neural foramina at multiple levels caused by bulging of annulus and facet hypertrophy. There is no  central spinal stenosis. There is 5 mm nonobstructing right renal calculus. 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 Electronically Signed   By: Elmer Picker M.D.   On: 08/01/2022 15:01   CT Thoracic Spine Wo Contrast  Result Date: 08/01/2022 CLINICAL DATA:  Metastatic disease, pain EXAM: CT THORACIC SPINE WITHOUT CONTRAST TECHNIQUE: Multidetector CT images of the thoracic were obtained using the standard protocol without intravenous contrast. RADIATION DOSE REDUCTION: This exam was performed according to the departmental dose-optimization program which includes automated exposure control, adjustment of the mA and/or kV according to patient size and/or use of iterative reconstruction technique. COMPARISON:  MR thoracic spine done on 05/07/2022 FINDINGS: Alignment: Alignment of posterior margins of vertebral bodies is unremarkable. Vertebrae: No recent fracture is seen. There are no focal lytic or sclerotic lesions. There is anterior surgical fusion at C7-T1 level. Paraspinal and other soft tissues: There is no central spinal stenosis. There are multiple noncalcified nodules in the visualized lung fields measuring up to 2.1 cm in diameter consistent with pulmonary metastatic disease. Enlarged lymph nodes are seen in mediastinum suggesting metastatic lymphadenopathy. Coronary artery calcifications are seen. Scattered calcifications are seen in thoracic aorta. Small right pleural effusion is seen. Disc levels: There is no significant encroachment of neural foramina. Degenerative changes are noted with anterior bony spurs at multiple levels. IMPRESSION: No recent fracture is seen in thoracic spine. No focal lytic or sclerotic lesions are seen. There is no central spinal stenosis or significant encroachment of neural foramina. Degenerative changes are noted with anterior bony spurs. There are multiple noncalcified lung nodules suggesting pulmonary metastatic disease.  Metastatic lymphadenopathy is seen in mediastinum and possibly right hilum. Small right pleural effusion. Electronically Signed   By: Elmer Picker M.D.   On: 08/01/2022 14:52   CT Cervical Spine Wo Contrast  Result Date: 08/01/2022 CLINICAL DATA:  Metastatic disease, neck pain EXAM: CT CERVICAL SPINE WITHOUT CONTRAST TECHNIQUE: Multidetector CT imaging of the cervical spine was performed without intravenous contrast. Multiplanar CT image reconstructions were also generated. RADIATION DOSE REDUCTION: This exam was performed according to the departmental dose-optimization program which includes automated exposure control, adjustment of the mA and/or kV according to patient size and/or use of iterative reconstruction technique. COMPARISON:  05/04/2022 FINDINGS: Alignment: There is minimal 1-2 mm anterolisthesis at C3-C4 level. This finding has not changed. Skull base and vertebrae: No fracture is seen. There is anterior surgical fusion at C7-T1 level. Degenerative changes are noted in facet joints at multiple levels. Soft tissues and spinal canal: There is no central spinal stenosis. Disc levels: There is encroachment of neural foramina by bony spurs and facet hypertrophy from C2-C4 levels. There is a minimal encroachment of neural foramina at C5-C6 level. Upper chest: There are a few noncalcified nodules measuring up to 6 mm in size in left upper lung field. Other: There is slightly inhomogeneous attenuation in thyroid without dominant nodules in the visualized portions of thyroid.  IMPRESSION: No recent fracture is seen in cervical spine. There are no focal lytic or sclerotic lesions. Cervical spondylosis with encroachment of neural foramina from C2-C6 levels. There is previous anterior surgical fusion at C7-T1 level. No significant interval changes are noted. Electronically Signed   By: Elmer Picker M.D.   On: 08/01/2022 14:40      Scheduled Meds:  bisacodyl  5 mg Oral QID   famotidine  20 mg  Oral QHS   ketorolac  15 mg Intravenous Once   megestrol  400 mg Oral BID   midodrine  10 mg Oral TID WC   morphine  60 mg Oral Q12H   polyethylene glycol  17 g Oral Daily   primidone  50 mg Oral Daily   rivaroxaban  20 mg Oral QPM   senna-docusate  1 tablet Oral BID   traZODone  50 mg Oral QHS   Continuous Infusions:   LOS: 0 days     Dessa Phi, DO [facility] 08/02/2022, 2:02 PM   Available via Epic secure chat 7am-7pm After these hours, please refer to coverage provider listed on [email or web address]

## 2022-08-02 NOTE — Progress Notes (Signed)
Doctor in speaking with patient concerning pain medications; informed doctor that patient wants pain medication every hour; doctor stated that pt pain medication can be given all at once and she would make adjustments to his pain medication

## 2022-08-02 NOTE — Progress Notes (Signed)
   This pt is active with [facility]- a home based Palliative care program that is provided by [facility]. He was admitted to [name] last Thursday in the home. Over the weekend his wife called our office asking about transitioning to hospice care. He landed in the hospital for increased pain to his back prior to our nurse visiting to assist with pain medications. I have talked with the wife today and introduced her to hospice services.She does not feel they are read for this at this time. She reports they have a follow up appointment with Pulmonology on 14th of Dec. To have a repeat scan and discuss options with Pulmonologist. The pt at this time is not getting any kind of treatment for his cancer. They were doing a trial study under [facility] but, due to increased weakness and progression of disease this has been stopped. She was encouraged that if she wanted to continue with scans and discuss ongoing option then [facility] would be the better program for her. We further discussed what a comfort care approach would look like under hospice care.   Please let us know if we can assist with d/c plan.  Webb Silversmith RN

## 2022-08-03 ENCOUNTER — Ambulatory Visit: Payer: Medicare Other | Admitting: Physical Medicine and Rehabilitation

## 2022-08-03 ENCOUNTER — Ambulatory Visit: Payer: Medicare Other | Admitting: Speech Pathology

## 2022-08-03 DIAGNOSIS — I255 Ischemic cardiomyopathy: Secondary | ICD-10-CM | POA: Diagnosis present

## 2022-08-03 DIAGNOSIS — I951 Orthostatic hypotension: Secondary | ICD-10-CM | POA: Diagnosis present

## 2022-08-03 DIAGNOSIS — G893 Neoplasm related pain (acute) (chronic): Secondary | ICD-10-CM | POA: Diagnosis present

## 2022-08-03 DIAGNOSIS — M6258 Muscle wasting and atrophy, not elsewhere classified, other site: Secondary | ICD-10-CM | POA: Diagnosis present

## 2022-08-03 DIAGNOSIS — M546 Pain in thoracic spine: Secondary | ICD-10-CM | POA: Diagnosis present

## 2022-08-03 DIAGNOSIS — R52 Pain, unspecified: Secondary | ICD-10-CM | POA: Diagnosis present

## 2022-08-03 DIAGNOSIS — Z79899 Other long term (current) drug therapy: Secondary | ICD-10-CM | POA: Diagnosis not present

## 2022-08-03 DIAGNOSIS — Z681 Body mass index (BMI) 19 or less, adult: Secondary | ICD-10-CM | POA: Diagnosis not present

## 2022-08-03 DIAGNOSIS — Z9221 Personal history of antineoplastic chemotherapy: Secondary | ICD-10-CM | POA: Diagnosis not present

## 2022-08-03 DIAGNOSIS — Z66 Do not resuscitate: Secondary | ICD-10-CM | POA: Diagnosis present

## 2022-08-03 DIAGNOSIS — Z902 Acquired absence of lung [part of]: Secondary | ICD-10-CM | POA: Diagnosis not present

## 2022-08-03 DIAGNOSIS — M549 Dorsalgia, unspecified: Secondary | ICD-10-CM | POA: Diagnosis not present

## 2022-08-03 DIAGNOSIS — E43 Unspecified severe protein-calorie malnutrition: Secondary | ICD-10-CM | POA: Diagnosis present

## 2022-08-03 DIAGNOSIS — I480 Paroxysmal atrial fibrillation: Secondary | ICD-10-CM | POA: Diagnosis present

## 2022-08-03 DIAGNOSIS — K219 Gastro-esophageal reflux disease without esophagitis: Secondary | ICD-10-CM | POA: Diagnosis present

## 2022-08-03 DIAGNOSIS — I252 Old myocardial infarction: Secondary | ICD-10-CM | POA: Diagnosis not present

## 2022-08-03 DIAGNOSIS — E7800 Pure hypercholesterolemia, unspecified: Secondary | ICD-10-CM | POA: Diagnosis present

## 2022-08-03 DIAGNOSIS — C7951 Secondary malignant neoplasm of bone: Secondary | ICD-10-CM | POA: Diagnosis present

## 2022-08-03 DIAGNOSIS — Z87891 Personal history of nicotine dependence: Secondary | ICD-10-CM | POA: Diagnosis not present

## 2022-08-03 DIAGNOSIS — C7931 Secondary malignant neoplasm of brain: Secondary | ICD-10-CM | POA: Diagnosis present

## 2022-08-03 DIAGNOSIS — G250 Essential tremor: Secondary | ICD-10-CM | POA: Diagnosis present

## 2022-08-03 DIAGNOSIS — F419 Anxiety disorder, unspecified: Secondary | ICD-10-CM | POA: Diagnosis present

## 2022-08-03 DIAGNOSIS — Z85828 Personal history of other malignant neoplasm of skin: Secondary | ICD-10-CM | POA: Diagnosis not present

## 2022-08-03 DIAGNOSIS — I10 Essential (primary) hypertension: Secondary | ICD-10-CM | POA: Diagnosis present

## 2022-08-03 DIAGNOSIS — I25810 Atherosclerosis of coronary artery bypass graft(s) without angina pectoris: Secondary | ICD-10-CM | POA: Diagnosis present

## 2022-08-03 DIAGNOSIS — Z85118 Personal history of other malignant neoplasm of bronchus and lung: Secondary | ICD-10-CM | POA: Diagnosis not present

## 2022-08-03 DIAGNOSIS — F32A Depression, unspecified: Secondary | ICD-10-CM | POA: Diagnosis present

## 2022-08-03 MED ORDER — DIAZEPAM 2 MG PO TABS
2.0000 mg | ORAL_TABLET | Freq: Every day | ORAL | Status: DC
  Administered 2022-08-04: 2 mg via ORAL
  Filled 2022-08-03 (×2): qty 1

## 2022-08-03 MED ORDER — LORAZEPAM 2 MG/ML IJ SOLN
1.0000 mg | INTRAMUSCULAR | Status: DC | PRN
  Administered 2022-08-03: 1 mg via INTRAVENOUS
  Filled 2022-08-03: qty 1

## 2022-08-03 MED ORDER — CELECOXIB 200 MG PO CAPS
200.0000 mg | ORAL_CAPSULE | Freq: Two times a day (BID) | ORAL | Status: DC
  Administered 2022-08-03 – 2022-08-05 (×5): 200 mg via ORAL
  Filled 2022-08-03 (×5): qty 1

## 2022-08-03 NOTE — Consult Note (Signed)
Consultation Note Date: 08/03/2022   Patient Name: [name]  DOB: 1955/12/02  MRN: [id number]  Age / Sex: 66 y.o., male  PCP: [name], MD Referring Physician: Dessa Phi, DO  Reason for Consultation: Symptom management  HPI/Patient Profile: 65 y.o. 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 Patient has been seen at [facility] for clinical trials and is followed locally by [facility] home-based palliative care program. Inpatient Palliative Medicine Team consulted for pain management needs during this hospitalization.  EMR review prior to visit. Presented to bedside and observed patient sitting on side of bed eating breakfast. He is observed leaning on his elbows rather than sitting upright. Overall emaciated appearance. Patient is alert and anxious. Heart rate in the 140's. Reports having been on the phone with wife and concerned about getting legal matters settled. Reports severe pain to lower back, rating it 8/10. He says that someone told him about IV pain medication and that is what he "would like to go home with." Patient adamant to return home within the next couple of days. Discussed with patient that if he goes home with infusion, methods would be via PICC or SQ site. Patient verbalizes understanding and is open to either. Reports having tried Methadone in the past and it not working.   Return visit to room following IV Dilaudid administration by bedside nurse. Patient observed resting peacefully with eyes closed.  Primary Decision  Maker NEXT OF KIN Wife [name]  Discussion: Call to wife [name] who reports being ready for hospice services. She explains that patient has a follow-up CT scan/appointment at [facility] on December 14, an appointment that is very important to patient. Wife wants to try to help patient get there even though she verbalizes belief that there is no cure. Provided emotional support through active listening. She reports how difficult it is to witness patient's decline, especially during the holidays. She has noted increased confusion over recent weeks. Reports that legal matters have all been handled - and feels that patient's anxiety is part of his confusion.  SUMMARY OF RECOMMENDATIONS   -PMT to continue conversations regarding patient's limited prognosis and goals of care. -Celebrex added to regimen to help reduce inflammation and improve pain. -Will continue to monitor PRN medication usage and make any necessary adjustments. IV Dilaudid noted to be effective. Will consider PCA for transition back to home - based on goals of care and availability of outpatient supports for a pump.  Code Status/Advance Care Planning: DNR   Prognosis:   Weeks  Discharge Planning: Home (with [facility]-outpatient palliative care vs. Hospice)  Primary Diagnoses: Present on Admission:  Intractable back pain  AF (paroxysmal atrial fibrillation) (HCC)  CAD of autologous artery  bypass graft without angina  Gastroesophageal reflux disease  Cancer associated pain  Constipation  Depression  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HCC)  Hypotension, postural  Metastasis to brain [place])   Review of Systems  Constitutional:  Positive for fatigue.  Musculoskeletal:  Positive for back pain.  Psychiatric/Behavioral:  The patient is nervous/anxious.     Physical Exam Constitutional:      Appearance: He is ill-appearing.  Cardiovascular:     Rate and Rhythm: Tachycardia present.  Musculoskeletal:     Lumbar back:  Decreased range of motion.  Skin:    General: Skin is warm and dry.     Coloration: Skin is pale.  Neurological:     Mental Status: He is alert.  Psychiatric:        Mood and Affect: Mood is anxious.    Vital Signs: BP 115/79 (BP Location: Right Arm)   Pulse 90   Temp 98.6 F (37 C) (Oral)   Resp 16   Ht 5\' 9"  (1.753 m)   Wt 57.2 kg   SpO2 100%   BMI 18.61 kg/m  Pain Scale: 0-10 POSS *See Group Information*: 1-Acceptable,Awake and alert Pain Score: 4    SpO2: SpO2: 100 % O2 Device:SpO2: 100 % O2 Flow Rate: Room air  IO: Intake/output summary:  Intake/Output Summary (Last 24 hours) at 08/03/2022 0912 Last data filed at 08/03/2022 0736 Gross per 24 hour  Intake 240 ml  Output 550 ml  Net -310 ml    LBM: Last BM Date : 08/01/22 Baseline Weight: Weight: 57.2 kg Most recent weight: Weight: 57.2 kg       Thank you for this consult. Palliative medicine will continue to follow and assist as needed.    Signed by: Moss Mc, RN MSN [facility] / NP Student Palliative Medicine    Please contact Palliative Medicine Team phone at [phone] for questions and concerns.  For individual provider: See Shea Evans

## 2022-08-03 NOTE — Care Management Obs Status (Signed)
[place] NOTIFICATION   Patient Details  Name: [name] MRN: [id number] Date of Birth: Mar 19, 1956   Medicare Observation Status Notification Given:  Yes    Angelita Ingles, RN 08/03/2022, 8:48 AM

## 2022-08-03 NOTE — Progress Notes (Signed)
PROGRESS NOTE    [name]  [name]:[id number] DOB: June 04, 1956 DOA: 08/01/2022 PCP: [name], MD     Brief Narrative:   ART Villa is a 66 y.o. 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   New events last 24 hours / Subjective: Unhappy with nursing care he received yesterday. He wants to go home and is not asking for IV pain medications since he believes that would hinder his discharge home today. I had a long discussion with him regarding improved pain control and needing to adjust his pain regimen prior to going home.   Assessment & Plan:   Principal Problem:   Intractable back pain Active Problems:   Depression   CAD of autologous artery bypass graft without angina   Gastroesophageal reflux disease   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HCC)   Metastasis to brain (HCC)   AF (paroxysmal atrial fibrillation) (HCC)   Cancer associated pain   Constipation   Hypotension, postural   Metastatic cancer to spine (HCC)   Intractable pain   Intractable back pain in setting of metastatic lung cancer to brain and bone -Continue home MS Contin 60 mg every 12 hours, oral Dilaudid 8 mg every 6 hours as needed.  Also added IV Dilaudid for breakthrough pain to alternate with p.o. -Palliative care consulted  for symptom management  Paroxysmal A-fib -Xarelto  Hypotension -Midodrine  Depression -Trazodone  Cognitive decline -Seen by Dr. Leta Baptist, neurology -Thought to be secondary to CNS metastatic disease, radiation side effect  Essential tremor -Primidone  Severe protein calorie malnutrition -BMI 18 with temporal wasting -Dietitian consult   DVT prophylaxis:   rivaroxaban (XARELTO) tablet 20 mg  Code Status: DNR Family Communication: No family at bedside, updated wife over the phone today  Disposition Plan:  Status is: Inpatient Remains inpatient appropriate because: IV pain medication, palliative care consult    Antimicrobials:  Anti-infectives (From admission, onward)    None        Objective: Vitals:   08/02/22 0230 08/02/22 0448 08/02/22 2019 08/03/22 0417  BP: 123/81 128/78 106/64 115/79  Pulse: 87 92 98 90  Resp: 17 17 15 16   Temp: 98.4 F (36.9 C) 98.7 F (37.1 C) 97.8 F (36.6 C) 98.6 F (37 C)  TempSrc: Oral Oral Oral Oral  SpO2: 100% 100% 100% 100%  Weight:      Height:        Intake/Output Summary (Last 24 hours) at 08/03/2022 1213 Last data filed at 08/03/2022 0736 Gross per 24 hour  Intake 240 ml  Output 550 ml  Net -310 ml    Filed Weights   08/01/22 1054  Weight: 57.2 kg    Examination:  General exam: Appears calm, frail appearing, temporal wasting  Respiratory system: Clear to auscultation. Respiratory effort normal. Cardiovascular system: S1 & S2 heard. No murmurs. No pedal edema. Gastrointestinal system: Abdomen is nondistended, soft Central nervous system: Alert and oriented Extremities:  Symmetric in appearance  Skin: No rashes, lesions or ulcers on exposed skin   Data Reviewed: I have personally reviewed following labs and imaging studies  CBC: Recent Labs  Lab 08/01/22 1246 08/02/22 0439  WBC 7.6 5.2  NEUTROABS 6.1  --   HGB 12.0* 11.1*  HCT 36.5* 34.9*  MCV 89.2 90.4  PLT 308 578    Basic Metabolic  Panel: Recent Labs  Lab 08/01/22 1246 08/02/22 0439  NA 136 136  K 3.7 4.8  CL 109 106  CO2 19* 25  GLUCOSE 93 95  BUN 17 21  CREATININE 0.84 0.97  CALCIUM 9.9 10.9*    GFR: Estimated Creatinine Clearance: 59.8 mL/min (by C-G formula based on SCr of 0.97 mg/dL). Liver Function Tests: Recent Labs  Lab 08/01/22 1246 08/02/22 0439  AST 21 20  ALT 15 16  ALKPHOS 63 63  BILITOT 0.7 0.6  PROT 7.4 7.6  ALBUMIN 3.2* 3.6    Recent Labs  Lab 08/01/22 1246  LIPASE 29    No results for input(s): "AMMONIA" in the last 168 hours. Coagulation Profile: No results for input(s): "INR", "PROTIME" in the last 168 hours. Cardiac Enzymes: No results for input(s): "CKTOTAL", "CKMB", "CKMBINDEX", "TROPONINI" in the last 168 hours. BNP (last 3 results) No results for input(s): "PROBNP" in the last 8760 hours. HbA1C: No results for input(s): "HGBA1C" in the last 72 hours. CBG: No results for input(s): "GLUCAP" in the last 168 hours. Lipid Profile: No results for input(s): "CHOL", "HDL", "LDLCALC", "TRIG", "CHOLHDL", "LDLDIRECT" in the last 72 hours. Thyroid Function Tests: No results for input(s): "TSH", "T4TOTAL", "FREET4", "T3FREE", "THYROIDAB" in the last 72 hours. Anemia Panel: No results for input(s): "VITAMINB12", "FOLATE", "FERRITIN", "TIBC", "IRON", "RETICCTPCT" in the last 72 hours. Sepsis Labs: No results for input(s): "PROCALCITON", "LATICACIDVEN" in the last 168 hours.  No results found for this or any previous visit (from the past 240 hour(s)).    Radiology Studies: DG Chest Portable 1 View  Result Date: 08/01/2022 CLINICAL DATA:  History of lung cancer with metastasis. EXAM: PORTABLE CHEST 1 VIEW COMPARISON:  July 06, 2022 FINDINGS: The heart size and mediastinal contours are stable. Right central venous line is unchanged. Multiple bilateral pulmonary nodules are identified consistent with metastasis. Small right pleural effusion is noted. The visualized skeletal  structures are stable. IMPRESSION: Multiple bilateral pulmonary nodules consistent with metastasis. Small right pleural effusion. Electronically Signed   By: Abelardo Diesel M.D.   On: 08/01/2022 15:49   CT Lumbar Spine Wo Contrast  Result Date: 08/01/2022 CLINICAL DATA:  Low back pain, metastatic carcinoma EXAM: CT LUMBAR SPINE WITHOUT CONTRAST TECHNIQUE: Multidetector CT imaging of the lumbar spine was performed without intravenous contrast administration. Multiplanar CT image reconstructions were also generated. RADIATION DOSE REDUCTION: This exam was performed according to the departmental dose-optimization program which includes automated exposure control, adjustment of the mA and/or kV according to patient size and/or use of iterative reconstruction technique. COMPARISON:  CT done on 05/04/2022 FINDINGS: Segmentation: There are 5 non-rib-bearing vertebrae in the lumbar region. Alignment: Alignment of posterior margins of vertebral bodies is unremarkable. Vertebrae: There is defect in the left pars interarticularis of L5 vertebra. Paraspinal and other soft tissues: Paraspinal soft tissues are unremarkable. In previous MR thoracic spine done on 05/07/2022, enhancing lesion was noted in the body of L1 vertebra. In the CT, there is no definite demonstrable lytic or sclerotic lesions in the bony structures. Arterial calcifications are seen in abdominal aorta and its major branches.  There is ectasia of right common iliac artery measuring 1.9 cm. There is 5 mm calculus in the right kidney. There is no hydronephrosis in the visualized portions of the kidneys. Disc levels: There is disc space narrowing from L1-L4 levels. There is encroachment of neural foramina by bony spurs and bulging of annulus from L1 to S1 levels. IMPRESSION: No recent fracture is seen. There are no focal lytic or sclerotic lesions. Alignment of posterior margins of vertebral bodies appears normal. There is encroachment of neural foramina at  multiple levels caused by bulging of annulus and facet hypertrophy. There is no central spinal stenosis. There is 5 mm nonobstructing right renal calculus. 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 Electronically Signed   By: Elmer Picker M.D.   On: 08/01/2022 15:01   CT Thoracic Spine Wo Contrast  Result Date: 08/01/2022 CLINICAL DATA:  Metastatic disease, pain EXAM: CT THORACIC SPINE WITHOUT CONTRAST TECHNIQUE: Multidetector CT images of the thoracic were obtained using the standard protocol without intravenous contrast. RADIATION DOSE REDUCTION: This exam was performed according to the departmental dose-optimization program which includes automated exposure control, adjustment of the mA and/or kV according to patient size and/or use of iterative reconstruction technique. COMPARISON:  MR thoracic spine done on 05/07/2022 FINDINGS: Alignment: Alignment of posterior margins of vertebral bodies is unremarkable. Vertebrae: No recent fracture is seen. There are no focal lytic or sclerotic lesions. There is anterior surgical fusion at C7-T1 level. Paraspinal and other soft tissues: There is no central spinal stenosis. There are multiple noncalcified nodules in the visualized lung fields measuring up to 2.1 cm in diameter consistent with pulmonary metastatic disease. Enlarged lymph nodes are seen in mediastinum suggesting metastatic lymphadenopathy. Coronary artery calcifications are seen. Scattered calcifications are seen in thoracic aorta. Small right pleural effusion is seen. Disc levels: There is no significant encroachment of neural foramina. Degenerative changes are noted with anterior bony spurs at multiple levels. IMPRESSION: No recent fracture is seen in thoracic spine. No focal lytic or sclerotic lesions are seen. There is no central spinal stenosis or significant encroachment of neural foramina. Degenerative changes are noted with anterior bony spurs. There are  multiple noncalcified lung nodules suggesting pulmonary metastatic disease. Metastatic lymphadenopathy is seen in mediastinum and possibly right hilum. Small right pleural effusion. Electronically Signed   By: Elmer Picker M.D.   On: 08/01/2022 14:52   CT Cervical Spine Wo Contrast  Result Date: 08/01/2022 CLINICAL DATA:  Metastatic disease, neck pain EXAM: CT CERVICAL SPINE WITHOUT CONTRAST TECHNIQUE: Multidetector CT imaging of the cervical spine was performed without intravenous contrast. Multiplanar CT image reconstructions were also generated. RADIATION DOSE REDUCTION: This exam was performed according to the departmental dose-optimization program which includes automated exposure control, adjustment of the mA and/or kV according to patient size and/or use of iterative reconstruction technique. COMPARISON:  05/04/2022 FINDINGS: Alignment: There is minimal 1-2 mm anterolisthesis at C3-C4 level. This finding has not changed. Skull base and vertebrae: No fracture is seen. There is anterior surgical fusion at C7-T1 level. Degenerative changes are noted in facet joints at multiple levels. Soft tissues and spinal canal: There is no central spinal stenosis. Disc levels: There is encroachment of neural foramina by bony spurs and facet hypertrophy from C2-C4 levels. There is a minimal encroachment of neural foramina at C5-C6 level. Upper chest: There are a few noncalcified nodules measuring up to 6 mm in size in left upper lung field. Other: There is slightly inhomogeneous attenuation  in thyroid without dominant nodules in the visualized portions of thyroid. IMPRESSION: No recent fracture is seen in cervical spine. There are no focal lytic or sclerotic lesions. Cervical spondylosis with encroachment of neural foramina from C2-C6 levels. There is previous anterior surgical fusion at C7-T1 level. No significant interval changes are noted. Electronically Signed   By: Elmer Picker M.D.   On: 08/01/2022  14:40      Scheduled Meds:  bisacodyl  5 mg Oral QID   celecoxib  200 mg Oral BID   diazepam  2 mg Oral QHS   famotidine  20 mg Oral QHS   megestrol  400 mg Oral BID   midodrine  10 mg Oral TID WC   morphine  60 mg Oral Q12H   polyethylene glycol  17 g Oral Daily   primidone  50 mg Oral Daily   rivaroxaban  20 mg Oral QPM   senna-docusate  1 tablet Oral BID   traZODone  50 mg Oral QHS   Continuous Infusions:   LOS: 0 days     Dessa Phi, DO [facility] 08/03/2022, 12:13 PM   Available via Epic secure chat 7am-7pm After these hours, please refer to coverage provider listed on [email or web address]

## 2022-08-03 NOTE — Plan of Care (Signed)
  Problem: Education: Goal: Knowledge of General Education information will improve Description: Including pain rating scale, medication(s)/side effects and non-pharmacologic comfort measures Outcome: Progressing   Problem: Pain Managment: Goal: General experience of comfort will improve Outcome: Progressing   

## 2022-08-04 DIAGNOSIS — M549 Dorsalgia, unspecified: Secondary | ICD-10-CM | POA: Diagnosis not present

## 2022-08-04 MED ORDER — HYDRALAZINE HCL 20 MG/ML IJ SOLN: 10.0000 mg | INTRAMUSCULAR | Status: DC | PRN

## 2022-08-04 MED ORDER — METOPROLOL TARTRATE 5 MG/5ML IV SOLN: 5.0000 mg | INTRAVENOUS | Status: DC | PRN

## 2022-08-04 MED ORDER — ALPRAZOLAM 0.5 MG PO TABS
0.5000 mg | ORAL_TABLET | Freq: Three times a day (TID) | ORAL | Status: DC | PRN
  Administered 2022-08-04: 0.5 mg via ORAL
  Filled 2022-08-04: qty 1

## 2022-08-04 MED ORDER — SENNOSIDES-DOCUSATE SODIUM 8.6-50 MG PO TABS: 1.0000 | ORAL_TABLET | Freq: Every evening | ORAL | Status: DC | PRN

## 2022-08-04 NOTE — TOC Initial Note (Signed)
Transition of Care [facility]) - Initial/Assessment Note    Patient Details  Name: [name] MRN: [id number] Date of Birth: [birth year]  Transition of Care [facility]) CM/SW Contact:    Henrietta Dine, RN Phone Number: 08/04/2022, 10:25 AM  Clinical Narrative:                  Transition of Care [facility]) Screening Note   Patient Details  Name: [name] Date of Birth: March 01, 1956   Transition of Care [facility]) CM/SW Contact:    Henrietta Dine, RN Phone Number: 08/04/2022, 10:25 AM  Transition of Care Department [facility]) has reviewed patient and no TOC needs have been identified at this time. We will continue to monitor patient advancement through interdisciplinary progression rounds. If new patient transition needs arise, please place a TOC consult.      Barriers to Discharge: Continued Medical Work up   Patient Goals and CMS Choice        Expected Discharge Plan and Services                                                Prior Living Arrangements/Services                       Activities of Daily Living Home Assistive Devices/Equipment: Environmental consultant (specify type) ADL Screening (condition at time of admission) Patient's cognitive ability adequate to safely complete daily activities?: Yes Is the patient deaf or have difficulty hearing?: No Does the patient have difficulty seeing, even when wearing glasses/contacts?: No Does the patient have difficulty concentrating, remembering, or making decisions?: No Patient able to express need for assistance with ADLs?: Yes Does the patient have difficulty dressing or bathing?: Yes Independently performs ADLs?: No Communication: Independent Dressing (OT): Needs assistance Is this a change from baseline?: Pre-admission baseline Grooming: Needs assistance Is this a change from baseline?: Pre-admission baseline Feeding: Independent Bathing: Needs assistance Is this a change from baseline?:  Pre-admission baseline Toileting: Needs assistance Is this a change from baseline?: Pre-admission baseline In/Out Bed: Needs assistance Is this a change from baseline?: Pre-admission baseline Walks in Home: Needs assistance Is this a change from baseline?: Pre-admission baseline Does the patient have difficulty walking or climbing stairs?: Yes Weakness of Legs: Both Weakness of Arms/Hands: Both  Permission Sought/Granted                  Emotional Assessment              Admission diagnosis:  Intractable back pain [M54.9] Stage 4 malignant neoplasm of lung, unspecified laterality (HCC) [C34.90] Cancer-related breakthrough pain [G89.3] Intractable pain [R52] Patient Active Problem List   Diagnosis Date Noted   Intractable pain 08/03/2022   Metastatic cancer to spine ([place]) 08/02/2022   Intractable back pain 08/01/2022   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[place])    Hypotension, postural 05/13/2022   Orthostatic hypotension    Generalized weakness 05/04/2022   Falls 05/04/2022   Spells of trembling 63/14/9702   Acute metabolic encephalopathy 63/78/5885   AMS (altered mental status) 03/14/2022   Blurry vision, bilateral 10/08/2021   Leukopenia 10/08/2021   Wound of back 10/06/2021   Hyponatremia 10/03/2021   Cancer-related breakthrough pain 10/02/2021   Encounter for antineoplastic immunotherapy 09/29/2021   Cancer associated pain 09/15/2021   Constipation 09/15/2021   Right-sided chest wall  pain 08/29/2021   Malnutrition of moderate degree 08/20/2021   Atrial tachycardia 08/18/2021   Lung cancer ([place]) 07/29/2021   Hemoptysis 06/06/2021   History of pulmonary embolus (PE) 04/29/2021   Normocytic anemia 04/29/2021   Hypotension due to hypovolemia 04/28/2021   Hypomagnesemia 04/08/2021   Port-A-Cath in place 04/07/2021   Protein-calorie malnutrition, moderate ([place]) 03/25/2021   PNA (pneumonia) 03/22/2021   Community acquired pneumonia 03/22/2021   Sepsis  ([place]) 03/22/2021   CKD (chronic kidney disease), stage III ([place]) 03/22/2021   Hypoxia 02/28/2021   Dyspnea 02/28/2021   Acute renal failure superimposed on stage 3a chronic kidney disease ([place]) 02/25/2021   AF (paroxysmal atrial fibrillation) ([place]) 02/25/2021   Pleural effusion 02/25/2021   Neutropenia, drug-induced ([place]) 02/24/2021   Positive blood cultures 02/17/2021   Positive blood culture 02/16/2021   Right flank pain 02/09/2021   Metastasis to brain ([place]) 01/30/2021   Encounter for antineoplastic chemotherapy 01/29/2021   Goals of care, counseling/discussion 01/14/2021   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[place]) 01/14/2021   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[place]) 01/14/2021   Acute back pain with sciatica 01/12/2021   Allergic rhinitis 01/12/2021   Amnesia 01/12/2021   Kidney stone 01/12/2021   Sciatica 01/12/2021   Bipolar disorder ([place]) 01/12/2021   S/P lobectomy of lung 12/24/2020   Solitary lung nodule 11/28/2020   Chest pain 11/07/2019   Gastroesophageal reflux disease    Cervical radiculopathy 10/17/2018   Preoperative clearance 10/04/2018   Palpitations    Coronary artery disease involving coronary bypass graft of native heart with angina pectoris ([place])    Transient loss of consciousness 03/24/2018   Ectopic atrial tachycardia 02/09/2018   Central chest pain 03/10/2017   Family hx-stroke 11/10/2016   Stroke-like episode ([place]) - R brain, s/p tPA 11/09/2016   Unstable angina ([place]) 09/07/2016   Claudication of both lower extremities ([place])    Pure hypercholesterolemia    Tobacco abuse disorder    CAD of autologous artery bypass graft without angina    Chest pain at rest 06/10/2016   Abnormal nuclear stress test - HIGH RISK 04/20/2016   Old MI (myocardial infarction)    Essential hypertension 02/26/2016   Ischemic cardiomyopathy 12/25/2015   Hyperlipidemia LDL goal <70 12/25/2015   Mild tobacco abuse in early remission 11/28/2015   Coronary  artery disease involving native coronary artery of native heart with angina pectoris ([place]) 11/28/2015   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   Acute MI anterior wall first episode care [facility])    Precordial chest pain 03/07/2015   Gout attack 03/07/2015   Mixed bipolar I disorder ([place]) 03/07/2015   Fibromyalgia 07/09/2014   Gout 07/09/2014   Anxiety 07/09/2014   Depression 07/09/2014   Hepatitis C 61/60/7371   Eosinophilic esophagitis 02/23/9484   PCP:  [name], MD Pharmacy:   [id number] - [place] [address], [address] AT [facility] OF [address] [place] [address] Phone: [phone] Fax: [phone]     Social Determinants of Health (SDOH) Interventions    Readmission Risk Interventions    05/09/2022   10:48 AM 03/17/2022   12:19 PM 08/21/2021    9:25 AM  Readmission Risk Prevention Plan  Transportation Screening Complete Complete Complete  Medication Review Press photographer) Complete Complete Complete  PCP or Specialist appointment within 3-5 days of discharge Complete Complete Complete  HRI or Home Care Consult Complete Complete Complete  SW Recovery Care/Counseling Consult Complete Complete  Complete  SW Consult Not Complete Comments   n/a  Palliative Care Screening Not Applicable Complete Not Applicable  Skilled Nursing Facility Complete Not Applicable Not Applicable

## 2022-08-04 NOTE — Progress Notes (Signed)
PROGRESS NOTE    [name]  YWV:[id number] DOB: [birth year] DOA: 08/01/2022 PCP: [name], MD   Brief Narrative:  66 y.o. 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    Assessment & Plan:  Principal Problem:   Intractable back pain Active Problems:   Depression   CAD of autologous artery bypass graft without angina   Gastroesophageal reflux disease   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HCC)   Metastasis to brain (HCC)   AF (paroxysmal atrial fibrillation) (HCC)   Cancer associated pain   Constipation   Hypotension, postural   Metastatic cancer to spine (HCC)   Intractable pain     Intractable back pain in setting of metastatic lung cancer to brain and bone -Current pain medication MS Contin 60 mg every 12 hours; p.o. Dilaudid 8 mg every 6 hours as needed -IV as needed Dilaudid for breakthrough -Bowel regimen as needed   Paroxysmal A-fib -Xarelto   Hypotension -Midodrine   Depression -Trazodone   Cognitive decline -Seen by Dr. Leta Baptist, neurology -Thought to be secondary to CNS metastatic disease, radiation side effect   Essential tremor -Primidone   Severe protein calorie malnutrition -BMI 18 with temporal wasting -Dietitian consult     DVT prophylaxis: Xarelto Code  Status: DNR Family Communication: Wife is at bedside  Status is: Inpatient Remains inpatient appropriate because: Hopefully we can control his pain over next 24 hours along with anxiety.  Thereafter he may build to go home.     Subjective: Patient seen and examined at bedside.  Telling me he is in a lot of pain and discomfort but to me he appears comfortable.  Patient is very forgetful of any instructions are given to him.  Wife is present at bedside and she confirms that patient is also very forgetful.   Examination:  General exam: Appears calm and comfortable, forgetful.  Cachectic frail Respiratory system: Clear to auscultation. Respiratory effort normal. Cardiovascular system: S1 & S2 heard, RRR. No JVD, murmurs, rubs, gallops or clicks. No pedal edema. Gastrointestinal system: Abdomen is nondistended, soft and nontender. No organomegaly or masses felt. Normal bowel sounds heard. Central nervous system: Alert and oriented to name Extremities: Symmetric 5 x 5 power. Skin: No rashes, lesions or ulcers Psychiatry: Judgement and insight very poor  Objective: Vitals:   08/03/22 0417 08/03/22 1413 08/03/22 2019 08/04/22 0607  BP: 115/79 120/78 113/71 109/79  Pulse: 90 (!) 107 95 93  Resp: 16 19 16 18   Temp: 98.6 F (37 C) (!) 97.4 F (36.3 C) 97.9 F (36.6 C) 98.6 F (37 C)  TempSrc: Oral Axillary Oral Oral  SpO2: 100% 100% 98% 100%  Weight:      Height:        Intake/Output Summary (Last 24 hours) at 08/04/2022 0831 Last data filed at 08/04/2022 0800 Gross per 24 hour  Intake --  Output 100 ml  Net -100 ml   [employer]  08/01/22 1054  Weight: 57.2 kg     Data Reviewed:   CBC: Recent Labs  Lab 08/01/22 1246 08/02/22 0439  WBC 7.6 5.2  NEUTROABS 6.1  --   HGB 12.0* 11.1*  HCT 36.5* 34.9*  MCV 89.2 90.4  PLT 308 175   Basic Metabolic Panel: Recent Labs  Lab 08/01/22 1246 08/02/22 0439  NA 136 136  K 3.7 4.8  CL 109 106  CO2 19* 25  GLUCOSE 93  95  BUN 17 21  CREATININE 0.84 0.97  CALCIUM 9.9 10.9*   GFR: Estimated Creatinine Clearance: 59.8 mL/min (by C-G formula based on SCr of 0.97 mg/dL). Liver Function Tests: Recent Labs  Lab 08/01/22 1246 08/02/22 0439  AST 21 20  ALT 15 16  ALKPHOS 63 63  BILITOT 0.7 0.6  PROT 7.4 7.6  ALBUMIN 3.2* 3.6   Recent Labs  Lab 08/01/22 1246  LIPASE 29   No results for input(s): "AMMONIA" in the last 168 hours. Coagulation Profile: No results for input(s): "INR", "PROTIME" in the last 168 hours. Cardiac Enzymes: No results for input(s): "CKTOTAL", "CKMB", "CKMBINDEX", "TROPONINI" in the last 168 hours. BNP (last 3 results) No results for input(s): "PROBNP" in the last 8760 hours. HbA1C: No results for input(s): "HGBA1C" in the last 72 hours. CBG: No results for input(s): "GLUCAP" in the last 168 hours. Lipid Profile: No results for input(s): "CHOL", "HDL", "LDLCALC", "TRIG", "CHOLHDL", "LDLDIRECT" in the last 72 hours. Thyroid Function Tests: No results for input(s): "TSH", "T4TOTAL", "FREET4", "T3FREE", "THYROIDAB" in the last 72 hours. Anemia Panel: No results for input(s): "VITAMINB12", "FOLATE", "FERRITIN", "TIBC", "IRON", "RETICCTPCT" in the last 72 hours. Sepsis Labs: No results for input(s): "PROCALCITON", "LATICACIDVEN" in the last 168 hours.  No results found for this or any previous visit (from the past 240 hour(s)).       Radiology Studies: No results found.      Scheduled Meds:  bisacodyl  5 mg Oral QID   celecoxib  200 mg Oral BID   diazepam  2 mg Oral QHS   famotidine  20 mg Oral QHS   megestrol  400 mg Oral BID   midodrine  10 mg Oral TID WC   morphine  60 mg Oral Q12H   polyethylene glycol  17 g Oral Daily   primidone  50 mg Oral Daily   rivaroxaban  20 mg Oral QPM   senna-docusate  1 tablet Oral BID   traZODone  50 mg Oral QHS   Continuous Infusions:   LOS: 1 day   Time spent= 35 mins    Jakeya Gherardi Arsenio Loader, MD [facility]  If 7PM-7AM, please contact night-coverage  08/04/2022, 8:31 AM

## 2022-08-04 NOTE — Progress Notes (Signed)
Mobility Specialist - Progress Note   08/04/22 1418  Mobility  Activity Ambulated with assistance in hallway  Level of Assistance Contact guard assist, steadying assist  Assistive Device Front wheel walker  Distance Ambulated (ft) 80 ft  Activity Response Tolerated well  Mobility Referral Yes  $Mobility charge 1 Mobility   Pt received in bed and agreeable to mobility. Pt had a coughing spell prior to getting up. Pt asked for cough medicine. Nurse made aware. No complaints during session. Pt to bed after session with all needs met.     [facility]

## 2022-08-04 NOTE — Progress Notes (Signed)
Initial Nutrition Assessment  DOCUMENTATION CODES:   Severe malnutrition in context of chronic illness  INTERVENTION:  - Liberalize diet to Regular to avoid restricting intake.  - Will order H[employer] Vital Cuisine po TID, each supplement provides 500 calories and 22g of protein.  - Encourage intake at all meals and of supplements.  - Continue Megace to aid in stimulating appetite (home medication). - Monitor weight trends closely.   NUTRITION DIAGNOSIS:   Severe Malnutrition related to catabolic illness, cancer and cancer related treatments (metastatic lung cancer to brain and bone) as evidenced by severe fat depletion, severe muscle depletion, percent weight loss (32.6% in 8 months).  GOAL:   Patient will meet greater than or equal to 90% of their needs  MONITOR:   PO intake, Supplement acceptance, Weight trends  REASON FOR ASSESSMENT:   Consult Assessment of nutrition requirement/status  ASSESSMENT:   66 y.o. 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   Met with patient and wife at bedside this afternoon. Patient reports a UBW of 180# and weight loss over the past year due to cancer. Per EMR, patient weighed at 187# in April and has since continued to lose weight to admission weight of 126#. This is a 61# or 32.6% weight loss in 8 months, severe.  Patient states over the past couple of weeks his eating has gotten worse. Unable to elaborate further but wife chimed in and reported patient's typical intake as below: Breakfast: cereal Lunch: soup and fruit Dinner:  varies Snack: Boost VHC 3x/day  Patient notes his appetite has been poor for several weeks and remains poor since being admitted. Only 1 meal documented since admission - 100% of lunch on 12/4.  Wife reports their daughter brought in lasagna and Tiramisu for dinner last night and patient ate a lot better than he has for several days.   Patient's lunch tray arrived during visit - spaghetti, fruit up, and a cookie. Set tray up for patient and patient started eating at end of interview. Patient notes he would like supplements during admission but doesn't like Ensure, would rather have Boost.  Discussed will check for available supplements but that there is a supplement with 500 calories and 22g protein we could try. Patient and wife endorsed understanding.   Patient with notable severe muscle and fat wasting and meets criteria for severe malnutrition.    Medications reviewed and include: Dulcolax QID, Megace, Miralax, Senokot  Labs reviewed:  -   NUTRITION - FOCUSED PHYSICAL EXAM:  Flowsheet Row Most Recent Value  Orbital Region Severe depletion  Upper Arm Region Severe depletion  Thoracic and Lumbar Region Severe depletion  Buccal Region Severe depletion  Temple Region Severe depletion  Clavicle Bone Region Severe depletion  Clavicle and Acromion Bone Region Severe depletion  Scapular Bone Region Unable to assess  Dorsal Hand Severe depletion  Patellar Region Severe depletion  Anterior Thigh Region Severe depletion  Posterior Calf Region Severe depletion  Edema (RD Assessment) None  Hair Reviewed  Eyes Reviewed  Mouth Reviewed  Skin Reviewed  Nails Reviewed       Diet Order:   Diet Order               Diet regular Room service appropriate? Yes; Fluid consistency: Thin  Diet effective now                   EDUCATION NEEDS:  Education needs have been addressed  Skin:  Skin Assessment: Reviewed RN Assessment  Last BM:  12/4  Height:  Ht Readings from Last 1  Encounters:  08/01/22 5' 9" (1.753 m)   Weight:  Wt Readings from Last 1 Encounters:  08/01/22 57.2 kg   BMI:  Body mass index is 18.61 kg/m.  Estimated Nutritional Needs:  Kcal:  2000-2300 kcals Protein:  100-115 grams Fluid:  >/= 2L      RD, LDN For contact information, refer to [facility].   

## 2022-08-05 DIAGNOSIS — M549 Dorsalgia, unspecified: Secondary | ICD-10-CM | POA: Diagnosis not present

## 2022-08-05 DIAGNOSIS — E43 Unspecified severe protein-calorie malnutrition: Secondary | ICD-10-CM | POA: Insufficient documentation

## 2022-08-05 LAB — BASIC METABOLIC PANEL
Anion gap: 7 (ref 5–15)
BUN: 25 mg/dL — ABNORMAL HIGH (ref 8–23)
CO2: 23 mmol/L (ref 22–32)
Calcium: 10.8 mg/dL — ABNORMAL HIGH (ref 8.9–10.3)
Chloride: 105 mmol/L (ref 98–111)
Creatinine, Ser: 1 mg/dL (ref 0.61–1.24)
GFR, Estimated: >60 mL/min (ref >60)
Glucose, Bld: 95 mg/dL (ref 70–99)
Potassium: 3.8 mmol/L (ref 3.5–5.1)
Sodium: 135 mmol/L (ref 135–145)

## 2022-08-05 LAB — MAGNESIUM: Magnesium: 2.1 mg/dL (ref 1.7–2.4)

## 2022-08-05 LAB — CBC
HCT: 36.5 % — ABNORMAL LOW (ref 39.0–52.0)
Hemoglobin: 11.7 g/dL — ABNORMAL LOW (ref 13.0–17.0)
MCH: 29.3 pg (ref 26.0–34.0)
MCHC: 32.1 g/dL (ref 30.0–36.0)
MCV: 91.5 fL (ref 80.0–100.0)
Platelets: 198 10*3/uL (ref 150–400)
RBC: 3.99 MIL/uL — ABNORMAL LOW (ref 4.22–5.81)
RDW: 14.6 % (ref 11.5–15.5)
WBC: 4.5 10*3/uL (ref 4.0–10.5)
nRBC: 0 % (ref 0.0–0.2)

## 2022-08-05 MED ORDER — ALPRAZOLAM 0.5 MG PO TABS: 0.5000 mg | ORAL_TABLET | Freq: Three times a day (TID) | ORAL | 0 refills | Status: AC | PRN

## 2022-08-05 MED ORDER — FAMOTIDINE 20 MG PO TABS: 20.0000 mg | ORAL_TABLET | Freq: Every day | ORAL | 0 refills | Status: AC

## 2022-08-05 MED ORDER — CELECOXIB 200 MG PO CAPS: 200.0000 mg | ORAL_CAPSULE | Freq: Two times a day (BID) | ORAL | 0 refills | Status: AC

## 2022-08-05 MED ORDER — DIAZEPAM 2 MG PO TABS: 2.0000 mg | ORAL_TABLET | Freq: Every day | ORAL | 0 refills | Status: AC

## 2022-08-05 MED ORDER — SENNOSIDES-DOCUSATE SODIUM 8.6-50 MG PO TABS: 1.0000 | ORAL_TABLET | Freq: Two times a day (BID) | ORAL | 0 refills | Status: AC

## 2022-08-05 NOTE — Plan of Care (Signed)
  Problem: Pain Managment: Goal: General experience of comfort will improve Outcome: Progressing   Problem: Safety: Goal: Ability to remain free from injury will improve Outcome: Progressing   

## 2022-08-05 NOTE — Discharge Summary (Signed)
Physician Discharge Summary  [name] VQM:[id number] DOB: Aug 24, 1956 DOA: 08/01/2022  PCP: Anthony Melter, MD  Admit date: 08/01/2022 Discharge date: 08/05/2022  Admitted From: Home Disposition:  Home  Recommendations for Outpatient Follow-up:  Follow up with PCP in 1-2 weeks Please obtain BMP/CBC in one week your next doctors visit.  Cont home MS contin and PO Dilaudid  Xanax prn and valium BID prescribed  Needs to follow up outpatient Palliative for further pain management. He is already established with them  Home Health: Home Equipment/Devices:None Discharge Condition: Stable CODE STATUS: DNR Diet recommendation: Regular  Brief/Interim Summary: 66 y.o. 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   Patient already has MS Contin and hydromorphone p.o. at home which has been prescribed by her outpatient pain management provider/palliative care service.  I will prescribe him Valium and Xanax as needed to help with his anxiety.  He already has bowel regimen.   Assessment/Plan:  Intractable back pain in setting of metastatic lung cancer to brain and bone -Current pain medication MS Contin 60 mg every 12 hours; p.o. Dilaudid 8 mg every 6 hours as needed.  Minimal use of IV pain medication over last 24  hours.  Valium twice daily and Xanax as needed has been added to help with his anxiety - Bowel regimen - Spoke with palliative care service in house and outpatient palliative/pain management provider.  They will assist with his outpatient follow-up   Paroxysmal A-fib -Xarelto   Hypotension -Midodrine   Depression -Trazodone   Cognitive decline -Seen by Dr. Leta Villa, neurology -Thought to be secondary to CNS metastatic disease, radiation side effect   Essential tremor -Primidone   Severe protein calorie malnutrition -BMI 18 with temporal wasting -Dietitian consult         Discharge Diagnoses:  Principal Problem:   Intractable back pain Active Problems:   Depression   CAD of autologous artery bypass graft without angina   Gastroesophageal reflux disease   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HCC)   Metastasis to brain (HCC)   AF (paroxysmal atrial fibrillation) (HCC)   Cancer associated pain   Constipation   Hypotension, postural   Metastatic cancer to spine (HCC)   Intractable pain   Protein-calorie malnutrition, severe      Consultations: Palliative care  Subjective: Feeling well.  Patient continues to remain forgetful but not in any acute distress.  Called [name], his wife, she was updated.   Discharge Exam: Vitals:   08/04/22 2152 08/05/22 0541  BP: 121/82 118/81  Pulse: (!) 103 (!) 110  Resp: 18 18  Temp: 98.5 F (36.9 C) 98.3 F (36.8 C)  SpO2: 100% 100%   Vitals:   08/04/22 0607 08/04/22 1356 08/04/22 2152 08/05/22 0541  BP: 109/79 116/67 121/82 118/81  Pulse: 93 97 (!) 103 (!) 110  Resp: _0 Temp: 98.6 F (37 C) 97.7 F (36.5 C) 98.5 F (36.9 C) 98.3 F (36.8  C)  TempSrc: Oral Oral Oral Oral  SpO2: 100% 99% 100% 100%  Weight:      Height:        General: Pt is alert, awake, not in acute distress. Forgetful.  Cardiovascular: RRR, S1/S2 +, no rubs, no gallops Respiratory: CTA bilaterally, no wheezing, no rhonchi Abdominal:  Soft, NT, ND, bowel sounds + Extremities: no edema, no cyanosis  Discharge Instructions   Allergies as of 08/05/2022       Reactions   Prednisone Other (See Comments)   States that this med makes him "crazy"   Tetanus Toxoids Swelling, Other (See Comments)   Fever, Swelling of the arm    Wellbutrin [bupropion] Other (See Comments)   Crazy thoughts, nightmares   Gabapentin    Hallucinations   Lorazepam Anxiety, Other (See Comments)   Makes him confused   Varenicline Other (See Comments)   Unpleasant dreams        Medication List     TAKE these medications    acetaminophen 325 MG tablet Commonly known as: TYLENOL Take 1-2 tablets (325-650 mg total) by mouth every 4 (four) hours as needed for mild pain.   ALPRAZolam 0.5 MG tablet Commonly known as: XANAX Take 1 tablet (0.5 mg total) by mouth 3 (three) times daily as needed for anxiety.   benzonatate 100 MG capsule Commonly known as: TESSALON TAKE 1 CAPSULE BY MOUTH THREE TIMES DAILY AS NEEDED FOR COUGH What changed: See the new instructions.   bisacodyl 5 MG EC tablet Commonly known as: DULCOLAX Take 1 tablet (5 mg total) by mouth daily as needed for moderate constipation. What changed: when to take this   celecoxib 200 MG capsule Commonly known as: CELEBREX Take 1 capsule (200 mg total) by mouth 2 (two) times daily.   diazepam 2 MG tablet Commonly known as: VALIUM Take 1 tablet (2 mg total) by mouth at bedtime.   docusate sodium 100 MG capsule Commonly known as: COLACE Take 1 capsule (100 mg total) by mouth 3 (three) times daily. What changed: when to take this   famotidine 20 MG tablet Commonly known as: PEPCID Take 1 tablet (20 mg total) by mouth at bedtime.   HYDROmorphone 8 MG tablet Commonly known as: DILAUDID Take 1 tablet by mouth every 6 hours as needed for severe pain. What changed: when to take this   lidocaine-prilocaine cream Commonly known as: EMLA Apply to the Port-A-Cath site  30-60-minute before treatment   magnesium gluconate 500 MG tablet Commonly known as: MAGONATE Take 0.5 tablets (250 mg total) by mouth at bedtime. What changed:  how much to take when to take this   megestrol 40 MG/ML suspension Commonly known as: MEGACE SHAKE LIQUID AND TAKE 10 ML(400 MG) BY MOUTH TWICE DAILY What changed: See the new instructions.   midodrine 10 MG tablet Commonly known as: PROAMATINE Take 1 tablet (10 mg total) by mouth 3 (three) times daily with meals.   morphine 60 MG 12 hr tablet Commonly known as: MS CONTIN Take 1 tablet (60 mg total) by mouth every 12 (twelve) hours. Start taking on: August 12, 2022   naloxone 4 MG/0.1ML Liqd nasal spray kit Commonly known as: NARCAN Place 1 spray intranasally into 1 nostril. If desired response not obtained after 2 or 3 minutes, may repeat 1 spray into other nostril   ondansetron 8 MG tablet Commonly known as: ZOFRAN Take 1 tablet by mouth every 8 hours as needed for nausea or vomiting. Starting 3 days after  chemotherapy What changed: reasons to take this   polyethylene glycol 17 g packet Commonly known as: MIRALAX / GLYCOLAX Take 17 g by mouth daily. What changed: how much to take   primidone 50 MG tablet Commonly known as: MYSOLINE Take 1 tablet (50 mg total) by mouth daily. What changed: when to take this   prochlorperazine 10 MG tablet Commonly known as: COMPAZINE Take 10 mg by mouth every 6 (six) hours.   senna-docusate 8.6-50 MG tablet Commonly known as: Senokot-S Take 1 tablet by mouth 2 (two) times daily.   Transderm-Scop 1 MG/3DAYS Generic drug: scopolamine Place 1 patch onto the skin every 3 (three) days as needed (for nausea).   traZODone 50 MG tablet Commonly known as: DESYREL Take 1 tablet (50 mg total) by mouth at bedtime. What changed:  when to take this reasons to take this   Vitamin D (Ergocalciferol) 1.25 MG (50000 UNIT) Caps capsule Commonly known as: DRISDOL Take 1 capsule  (50,000 Units total) by mouth every 7 (seven) days. What changed: when to take this   Voltaren 1 % Gel Generic drug: diclofenac Sodium Apply 2 g topically 4 (four) times daily as needed (to painful sites).   Xarelto 20 MG Tabs tablet Generic drug: rivaroxaban Take 1 tablet (20 mg total) by mouth every evening.        Follow-up Information     Anthony Melter, MD Follow up in 1 week(s).   Specialty: Family Medicine Contact information: [place] [address] [phone]                Allergies  Allergen Reactions   Prednisone Other (See Comments)    States that this med makes him "crazy"   Tetanus Toxoids Swelling and Other (See Comments)    Fever, Swelling of the arm    Wellbutrin [Bupropion] Other (See Comments)    Crazy thoughts, nightmares   Gabapentin     Hallucinations    Lorazepam Anxiety and Other (See Comments)    Makes him confused   Varenicline Other (See Comments)    Unpleasant dreams    You were cared for by a hospitalist during your hospital stay. If you have any questions about your discharge medications or the care you received while you were in the hospital after you are discharged, you can call the unit and asked to speak with the hospitalist on call if the hospitalist that took care of you is not available. Once you are discharged, your primary care physician will handle any further medical issues. Please note that no refills for any discharge medications will be authorized once you are discharged, as it is imperative that you return to your primary care physician (or establish a relationship with a primary care physician if you do not have one) for your aftercare needs so that they can reassess your need for medications and monitor your lab values.   Procedures/Studies: DG Chest Portable 1 View  Result Date: 08/01/2022 CLINICAL DATA:  History of lung cancer with metastasis. EXAM: PORTABLE CHEST 1 VIEW COMPARISON:   July 06, 2022 FINDINGS: The heart size and mediastinal contours are stable. Right central venous line is unchanged. Multiple bilateral pulmonary nodules are identified consistent with metastasis. Small right pleural effusion is noted. The visualized skeletal structures are stable. IMPRESSION: Multiple bilateral pulmonary nodules consistent with metastasis. Small right pleural effusion. Electronically Signed   By: Abelardo Diesel M.D.   On: 08/01/2022 15:49   CT Lumbar Spine  Wo Contrast  Result Date: 08/01/2022 CLINICAL DATA:  Low back pain, metastatic carcinoma EXAM: CT LUMBAR SPINE WITHOUT CONTRAST TECHNIQUE: Multidetector CT imaging of the lumbar spine was performed without intravenous contrast administration. Multiplanar CT image reconstructions were also generated. RADIATION DOSE REDUCTION: This exam was performed according to the departmental dose-optimization program which includes automated exposure control, adjustment of the mA and/or kV according to patient size and/or use of iterative reconstruction technique. COMPARISON:  CT done on 05/04/2022 FINDINGS: Segmentation: There are 5 non-rib-bearing vertebrae in the lumbar region. Alignment: Alignment of posterior margins of vertebral bodies is unremarkable. Vertebrae: There is defect in the left pars interarticularis of L5 vertebra. Paraspinal and other soft tissues: Paraspinal soft tissues are unremarkable. In previous MR thoracic spine done on 05/07/2022, enhancing lesion was noted in the body of L1 vertebra. In the CT, there is no definite demonstrable lytic or sclerotic lesions in the bony structures. Arterial calcifications are seen in abdominal aorta and its major branches. There is ectasia of right common iliac artery measuring 1.9 cm. There is 5 mm calculus in the right kidney. There is no hydronephrosis in the visualized portions of the kidneys. Disc levels: There is disc space narrowing from L1-L4 levels. There is encroachment of neural  foramina by bony spurs and bulging of annulus from L1 to S1 levels. IMPRESSION: No recent fracture is seen. There are no focal lytic or sclerotic lesions. Alignment of posterior margins of vertebral bodies appears normal. There is encroachment of neural foramina at multiple levels caused by bulging of annulus and facet hypertrophy. There is no central spinal stenosis. There is 5 mm nonobstructing right renal calculus. 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 Electronically Signed   By: Elmer Picker M.D.   On: 08/01/2022 15:01   CT Thoracic Spine Wo Contrast  Result Date: 08/01/2022 CLINICAL DATA:  Metastatic disease, pain EXAM: CT THORACIC SPINE WITHOUT CONTRAST TECHNIQUE: Multidetector CT images of the thoracic were obtained using the standard protocol without intravenous contrast. RADIATION DOSE REDUCTION: This exam was performed according to the departmental dose-optimization program which includes automated exposure control, adjustment of the mA and/or kV according to patient size and/or use of iterative reconstruction technique. COMPARISON:  MR thoracic spine done on 05/07/2022 FINDINGS: Alignment: Alignment of posterior margins of vertebral bodies is unremarkable. Vertebrae: No recent fracture is seen. There are no focal lytic or sclerotic lesions. There is anterior surgical fusion at C7-T1 level. Paraspinal and other soft tissues: There is no central spinal stenosis. There are multiple noncalcified nodules in the visualized lung fields measuring up to 2.1 cm in diameter consistent with pulmonary metastatic disease. Enlarged lymph nodes are seen in mediastinum suggesting metastatic lymphadenopathy. Coronary artery calcifications are seen. Scattered calcifications are seen in thoracic aorta. Small right pleural effusion is seen. Disc levels: There is no significant encroachment of neural foramina. Degenerative changes are noted with anterior bony spurs at multiple  levels. IMPRESSION: No recent fracture is seen in thoracic spine. No focal lytic or sclerotic lesions are seen. There is no central spinal stenosis or significant encroachment of neural foramina. Degenerative changes are noted with anterior bony spurs. There are multiple noncalcified lung nodules suggesting pulmonary metastatic disease. Metastatic lymphadenopathy is seen in mediastinum and possibly right hilum. Small right pleural effusion. Electronically Signed   By: Elmer Picker M.D.   On: 08/01/2022 14:52   CT Cervical Spine Wo Contrast  Result Date: 08/01/2022 CLINICAL DATA:  Metastatic disease, neck pain EXAM:  CT CERVICAL SPINE WITHOUT CONTRAST TECHNIQUE: Multidetector CT imaging of the cervical spine was performed without intravenous contrast. Multiplanar CT image reconstructions were also generated. RADIATION DOSE REDUCTION: This exam was performed according to the departmental dose-optimization program which includes automated exposure control, adjustment of the mA and/or kV according to patient size and/or use of iterative reconstruction technique. COMPARISON:  05/04/2022 FINDINGS: Alignment: There is minimal 1-2 mm anterolisthesis at C3-C4 level. This finding has not changed. Skull base and vertebrae: No fracture is seen. There is anterior surgical fusion at C7-T1 level. Degenerative changes are noted in facet joints at multiple levels. Soft tissues and spinal canal: There is no central spinal stenosis. Disc levels: There is encroachment of neural foramina by bony spurs and facet hypertrophy from C2-C4 levels. There is a minimal encroachment of neural foramina at C5-C6 level. Upper chest: There are a few noncalcified nodules measuring up to 6 mm in size in left upper lung field. Other: There is slightly inhomogeneous attenuation in thyroid without dominant nodules in the visualized portions of thyroid. IMPRESSION: No recent fracture is seen in cervical spine. There are no focal lytic or  sclerotic lesions. Cervical spondylosis with encroachment of neural foramina from C2-C6 levels. There is previous anterior surgical fusion at C7-T1 level. No significant interval changes are noted. Electronically Signed   By: Elmer Picker M.D.   On: 08/01/2022 14:40   CT Head Wo Contrast  Result Date: 07/06/2022 CLINICAL DATA:  Altered mental status. EXAM: CT HEAD WITHOUT CONTRAST TECHNIQUE: Contiguous axial images were obtained from the base of the skull through the vertex without intravenous contrast. RADIATION DOSE REDUCTION: This exam was performed according to the departmental dose-optimization program which includes automated exposure control, adjustment of the mA and/or kV according to patient size and/or use of iterative reconstruction technique. COMPARISON:  May 04, 2022 FINDINGS: Brain: There is mild cerebral atrophy with widening of the extra-axial spaces and ventricular dilatation. There are areas of decreased attenuation within the white matter tracts of the supratentorial brain, consistent with microvascular disease changes. Vascular: No hyperdense vessel or unexpected calcification. Skull: Normal. Negative for fracture or focal lesion. Sinuses/Orbits: There is mild bilateral maxillary sinus mucosal thickening. Other: None. IMPRESSION: 1. No acute intracranial abnormality. 2. Generalized cerebral atrophy and microvascular disease changes of the supratentorial brain. 3. Mild bilateral maxillary sinus disease. Electronically Signed   By: Virgina Norfolk M.D.   On: 07/06/2022 21:39   DG Chest Port 1 View  Result Date: 07/06/2022 CLINICAL DATA:  Altered mental status. Patient reports right-sided rib pain, chronic. History of lung cancer with metastasis. EXAM: PORTABLE CHEST 1 VIEW COMPARISON:  Radiograph and CT 05/04/2022 FINDINGS: Right chest port remains in place. Post median sternotomy. Chronic volume loss in the right hemithorax. There is increasing right pleural effusion with  progressive haziness in the mid lower right hemithorax. Multiple bilateral pulmonary nodules consistent with known metastatic disease, not well assessed by radiograph. Right rib irregularity on prior CT is obscured on the current exam. There is no obvious acute fracture. IMPRESSION: 1. Increasing right pleural effusion with progressive haziness in the mid lower right hemithorax. 2. Bilateral pulmonary nodules consistent with known metastatic disease, not well assessed by radiograph. Electronically Signed   By: Keith Rake M.D.   On: 07/06/2022 21:34     The results of significant diagnostics from this hospitalization (including imaging, microbiology, ancillary and laboratory) are listed below for reference.     Microbiology: No results found for this or any previous visit (  from the past 240 hour(s)).   Labs: BNP (last 3 results) No results for input(s): "BNP" in the last 8760 hours. Basic Metabolic Panel: Recent Labs  Lab 08/01/22 1246 08/02/22 0439 08/05/22 0637  NA 136 136 135  K 3.7 4.8 3.8  CL 109 106 105  CO2 19* 25 23  GLUCOSE 93 95 95  BUN 17 21 25*  CREATININE 0.84 0.97 1.00  CALCIUM 9.9 10.9* 10.8*  MG  --   --  2.1   Liver Function Tests: Recent Labs  Lab 08/01/22 1246 08/02/22 0439  AST 21 20  ALT 15 16  ALKPHOS 63 63  BILITOT 0.7 0.6  PROT 7.4 7.6  ALBUMIN 3.2* 3.6   Recent Labs  Lab 08/01/22 1246  LIPASE 29   No results for input(s): "AMMONIA" in the last 168 hours. CBC: Recent Labs  Lab 08/01/22 1246 08/02/22 0439 08/05/22 0637  WBC 7.6 5.2 4.5  NEUTROABS 6.1  --   --   HGB 12.0* 11.1* 11.7*  HCT 36.5* 34.9* 36.5*  MCV 89.2 90.4 91.5  PLT 308 237 198   Cardiac Enzymes: No results for input(s): "CKTOTAL", "CKMB", "CKMBINDEX", "TROPONINI" in the last 168 hours. BNP: Invalid input(s): "POCBNP" CBG: No results for input(s): "GLUCAP" in the last 168 hours. D-Dimer No results for input(s): "DDIMER" in the last 72 hours. Hgb A1c No  results for input(s): "HGBA1C" in the last 72 hours. Lipid Profile No results for input(s): "CHOL", "HDL", "LDLCALC", "TRIG", "CHOLHDL", "LDLDIRECT" in the last 72 hours. Thyroid function studies No results for input(s): "TSH", "T4TOTAL", "T3FREE", "THYROIDAB" in the last 72 hours.  Invalid input(s): "FREET3" Anemia work up No results for input(s): "VITAMINB12", "FOLATE", "FERRITIN", "TIBC", "IRON", "RETICCTPCT" in the last 72 hours. Urinalysis    Component Value Date/Time   COLORURINE YELLOW 08/01/2022 1412   APPEARANCEUR CLEAR 08/01/2022 1412   LABSPEC 1.020 08/01/2022 1412   PHURINE 6.5 08/01/2022 1412   GLUCOSEU NEGATIVE 08/01/2022 1412   HGBUR NEGATIVE 08/01/2022 1412   BILIRUBINUR NEGATIVE 08/01/2022 1412   KETONESUR NEGATIVE 08/01/2022 1412   PROTEINUR TRACE (A) 08/01/2022 1412   UROBILINOGEN 0.2 05/13/2014 0658   NITRITE NEGATIVE 08/01/2022 1412   LEUKOCYTESUR NEGATIVE 08/01/2022 1412   Sepsis Labs Recent Labs  Lab 08/01/22 1246 08/02/22 0439 08/05/22 0637  WBC 7.6 5.2 4.5   Microbiology No results found for this or any previous visit (from the past 240 hour(s)).   Time coordinating discharge:  I have spent 35 minutes face to face with the patient and on the ward discussing the patients care, assessment, plan and disposition with other care givers. >50% of the time was devoted counseling the patient about the risks and benefits of treatment/Discharge disposition and coordinating care.   SIGNED:   Damita Lack, MD  [facility] 08/05/2022, 9:46 AM   If 7PM-7AM, please contact night-coverage

## 2022-08-05 NOTE — Progress Notes (Signed)
[name] to be D/C'd per MD order. Discussed with the patient and all questions fully answered. ? VSS, Skin clean, dry and intact without evidence of skin break down, no evidence of skin tears noted. ? IV catheter discontinued intact. Site without signs and symptoms of complications. Dressing and pressure applied. ? An After Visit Summary was printed and given to the patient. Patient informed where to pickup prescriptions. ? D/c education completed with patient/family including follow up instructions, medication list, d/c activities limitations if indicated, with other d/c instructions as indicated by MD - patient able to verbalize understanding, all questions fully answered.  ? Patient instructed to return to ED, call 911, or call MD for any changes in condition.   [name] and wife, [name], satisfied that all belongings have been returned including dentures, cell phone and clothing. ? Patient to be escorted via [place], and D/C home via private auto.

## 2022-08-10 ENCOUNTER — Ambulatory Visit: Payer: Medicare Other | Admitting: Physical Medicine and Rehabilitation

## 2022-08-19 ENCOUNTER — Encounter: Payer: Medicare Other | Admitting: Psychology

## 2022-08-23 IMAGING — US US EXTREM LOW VENOUS*L*
1 series · 13 of 24 positions shown · non-contrast
Comparison: None.

CLINICAL DATA: Left leg pain



[Series 1: us extrem low venous*left* · 13 of 41 slices shown]
[im 1/41]
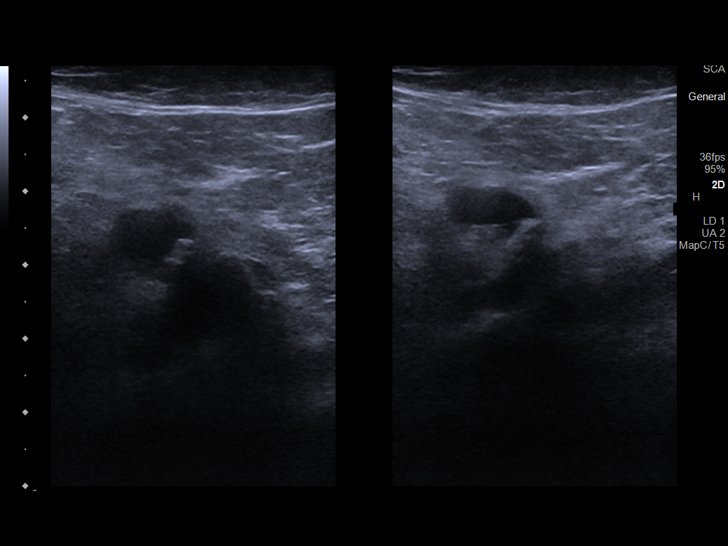
[im 4/41]
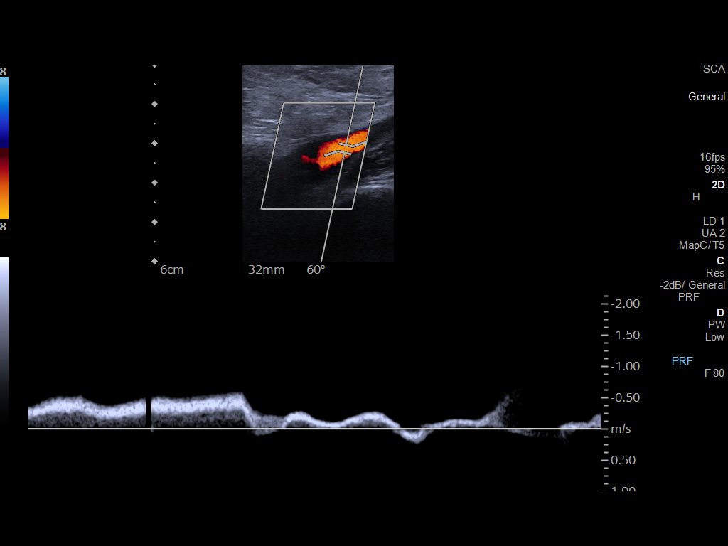
[im 7/41]
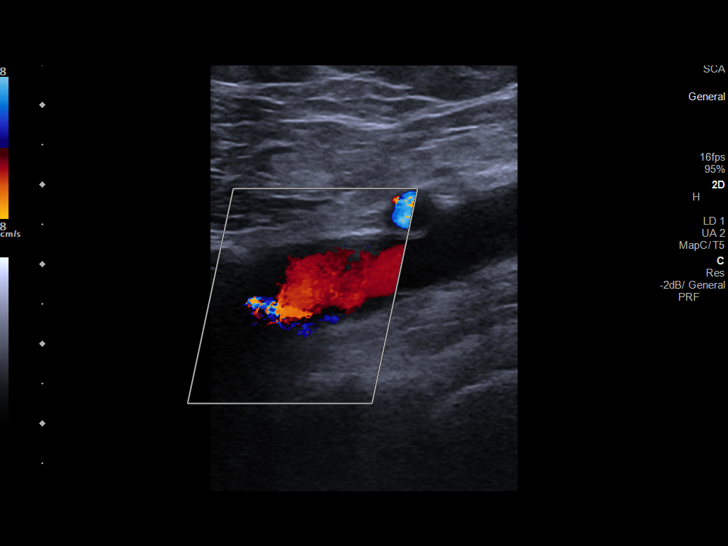
[im 11/41]
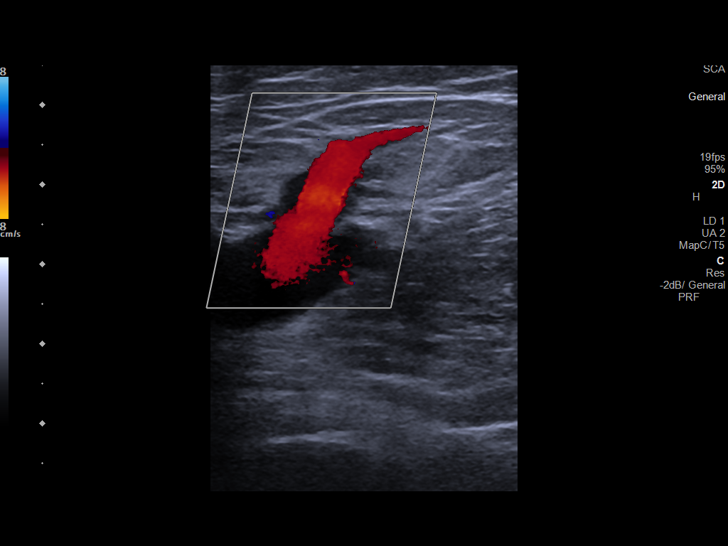
[im 14/41]
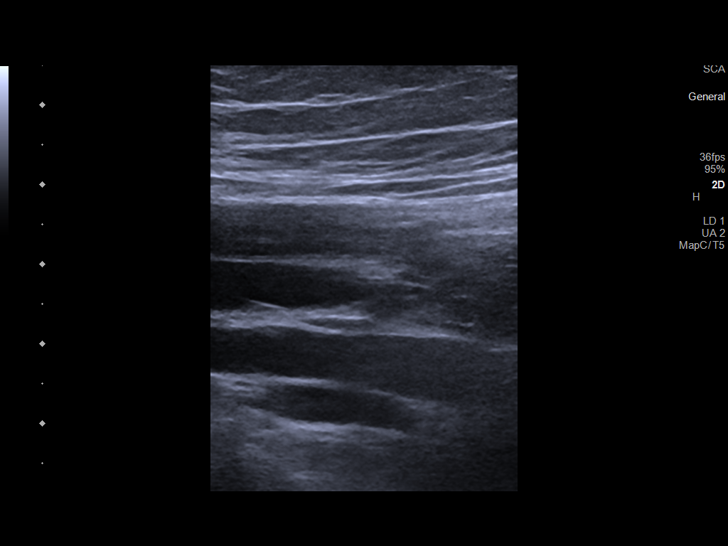
[im 18/41]
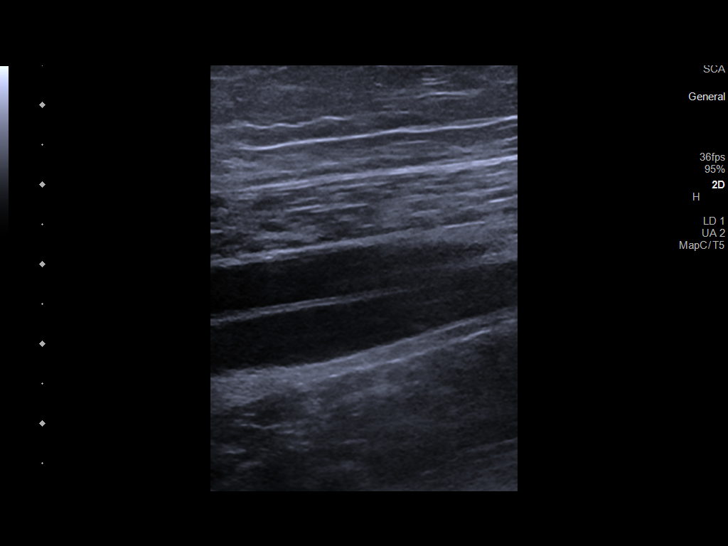
[im 21/41]
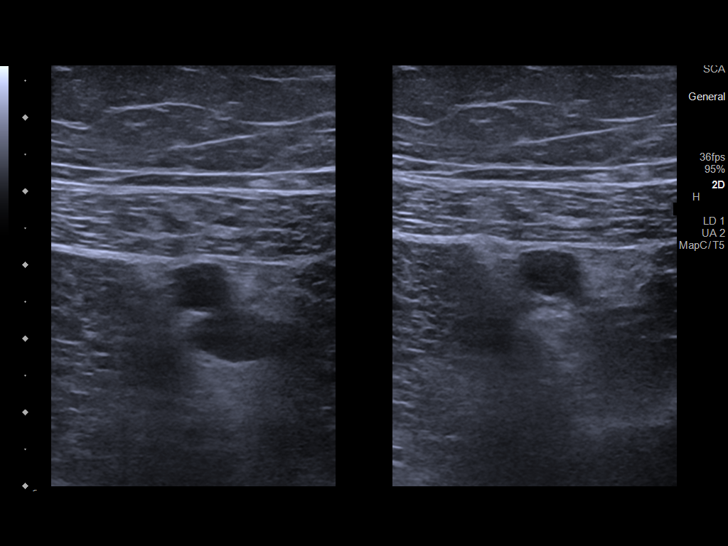
[im 23/41]
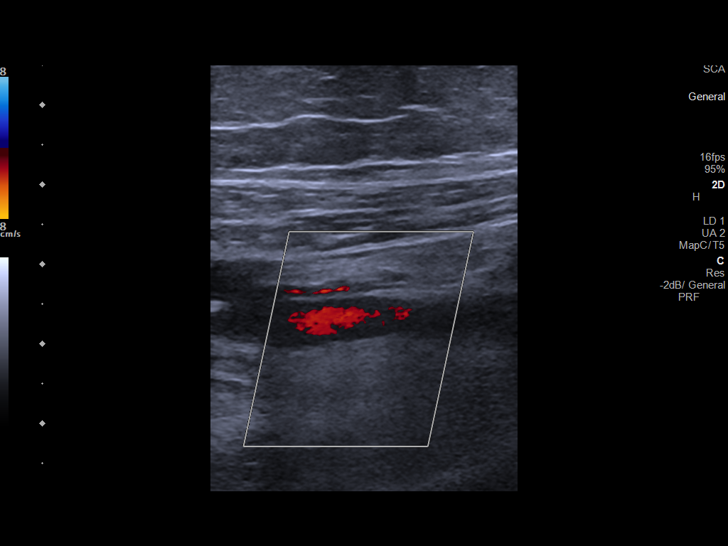
[im 27/41]
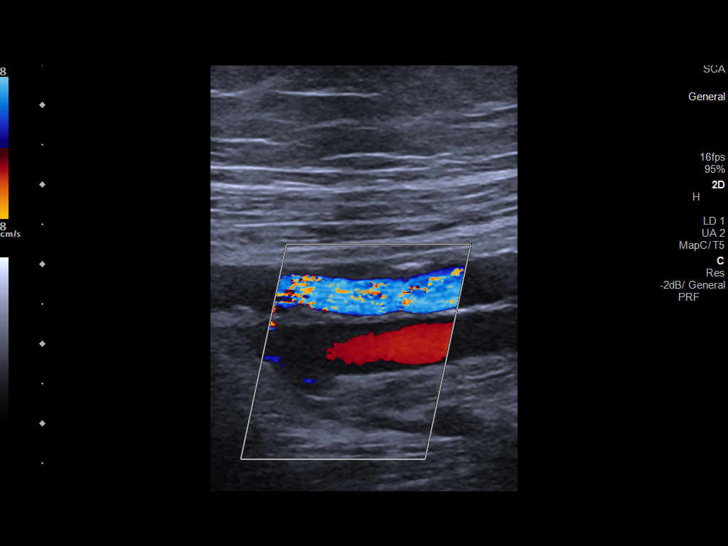
[im 30/41]
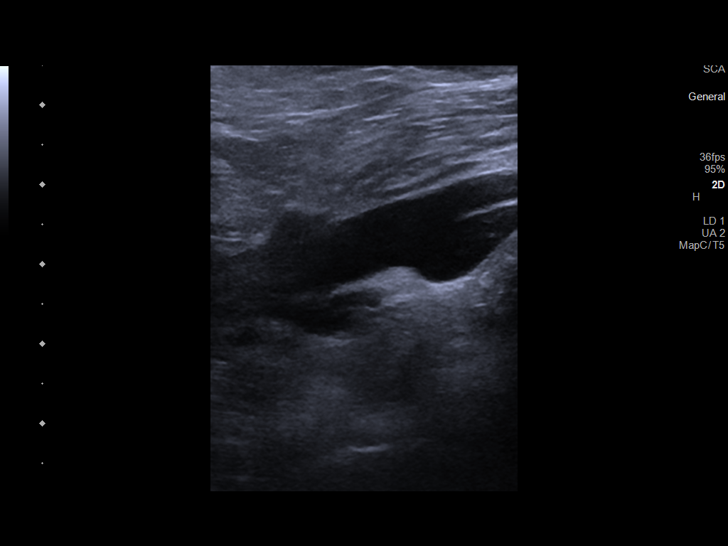
[im 34/41]
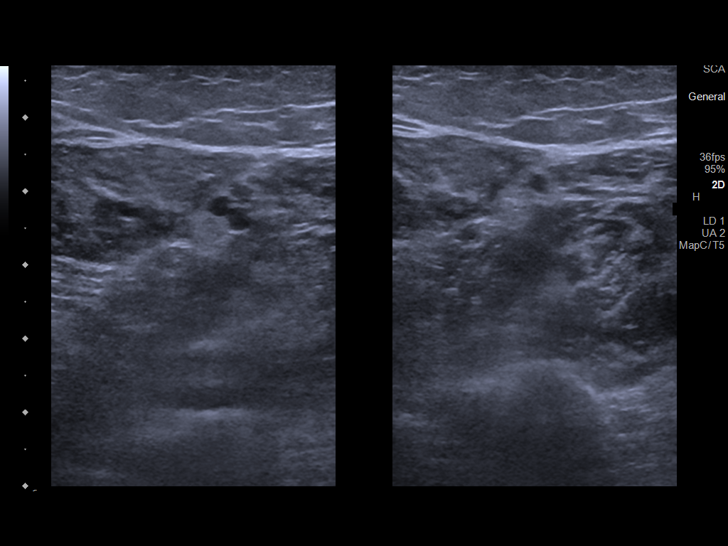
[im 37/41]
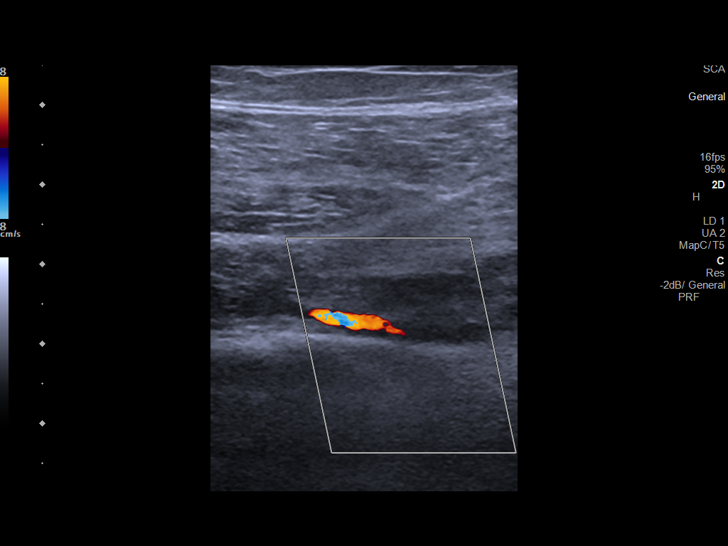
[im 41/41]
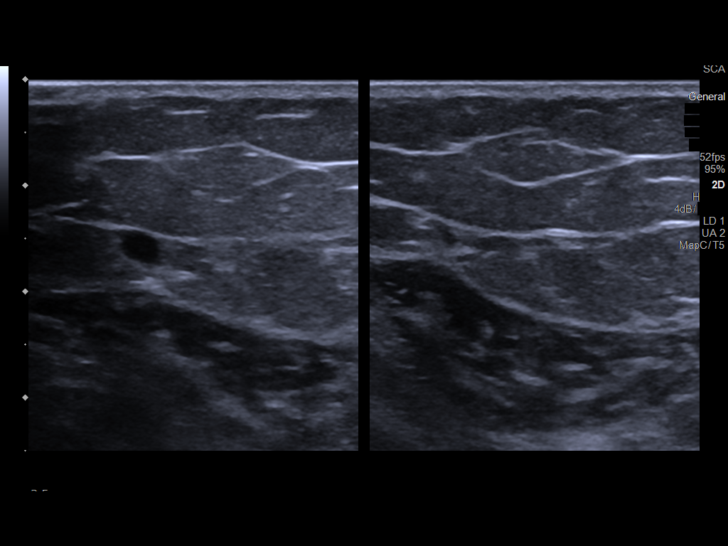

[13 of 24 positions shown; findings below may reference images not displayed]

FINDINGS: Contralateral Common Femoral Vein: Respiratory phasicity is normal
and symmetric with the symptomatic side. No evidence of thrombus.
Normal compressibility.

Common Femoral Vein: No evidence of thrombus. Normal
compressibility, respiratory phasicity and response to augmentation.

Saphenofemoral Junction: No evidence of thrombus. Normal
compressibility and flow on color Doppler imaging.

Profunda Femoral Vein: No evidence of thrombus. Normal
compressibility and flow on color Doppler imaging.

Femoral Vein: No evidence of thrombus. Normal compressibility,
respiratory phasicity and response to augmentation.

Popliteal Vein: No evidence of thrombus. Normal compressibility,
respiratory phasicity and response to augmentation.

Calf Veins: No evidence of thrombus. Normal compressibility and flow
on color Doppler imaging.

Superficial Great Saphenous Vein: No evidence of thrombus. Normal
compressibility.

Venous Reflux:  None.

Other Findings:  None.
IMPRESSION: No evidence of left lower extremity deep venous thrombosis.

## 2022-08-23 IMAGING — US US ABDOMEN LIMITED RUQ/ASCITES
1 series · 14 of 25 positions shown · non-contrast
Comparison: Renal ultrasound 05/20/2020. Abdominopelvic CT
05/27/2020.

CLINICAL DATA: Epigastric pain.

EXAM:
ULTRASOUND ABDOMEN LIMITED RIGHT UPPER QUADRANT

[Series 1: us abdomen limited ruq/ascites · 14 of 30 slices shown]
[im 1/30]
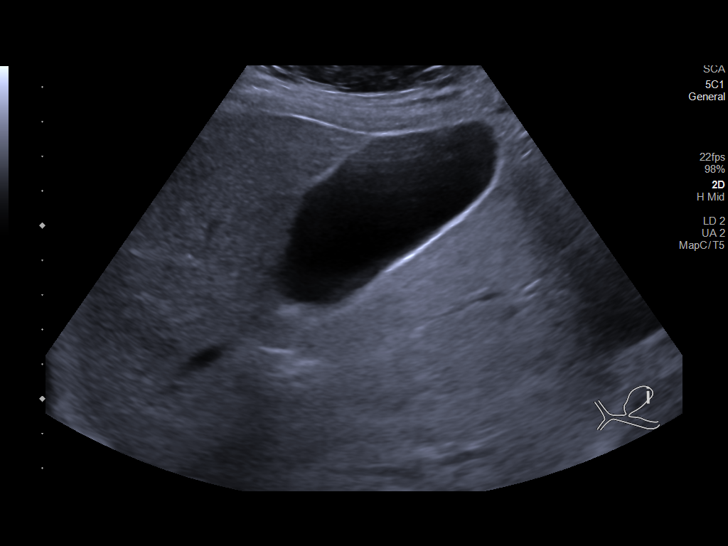
[im 3/30]
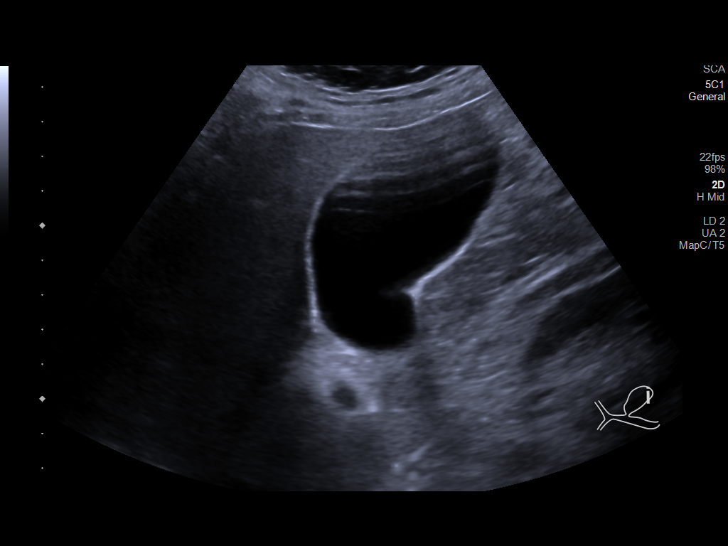
[im 5/30]
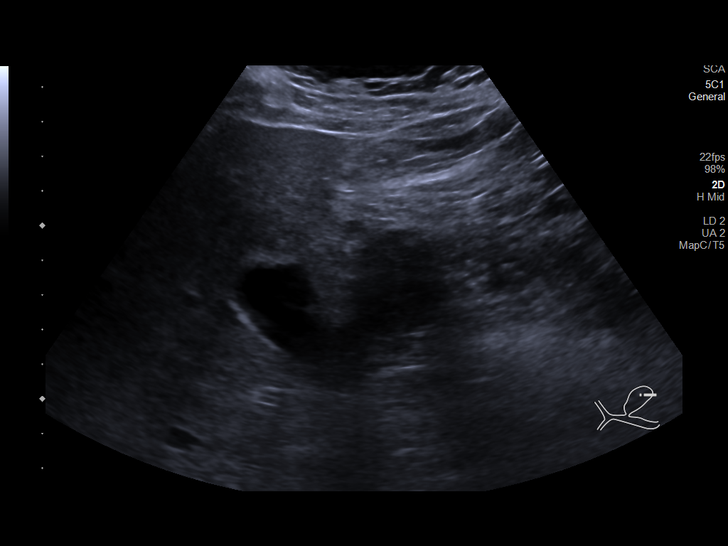
[im 8/30]
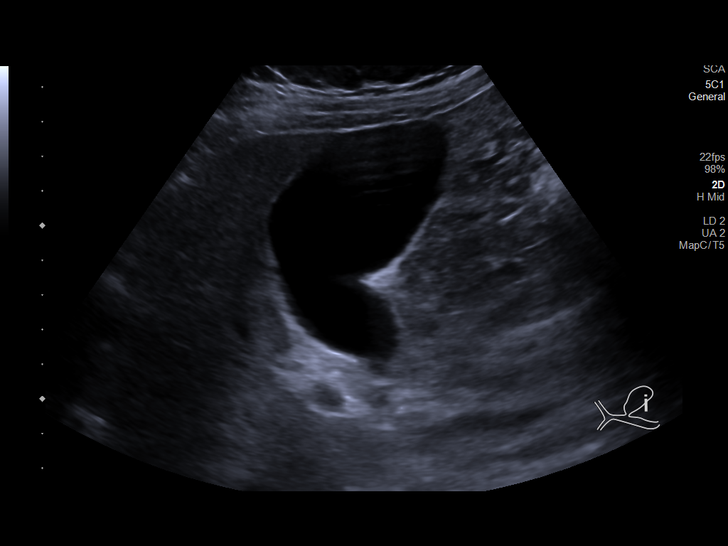
[im 10/30]
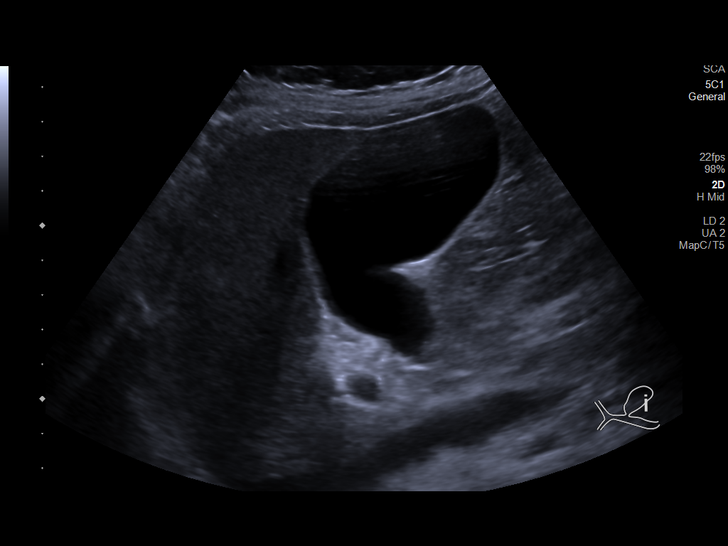
[im 11/30]
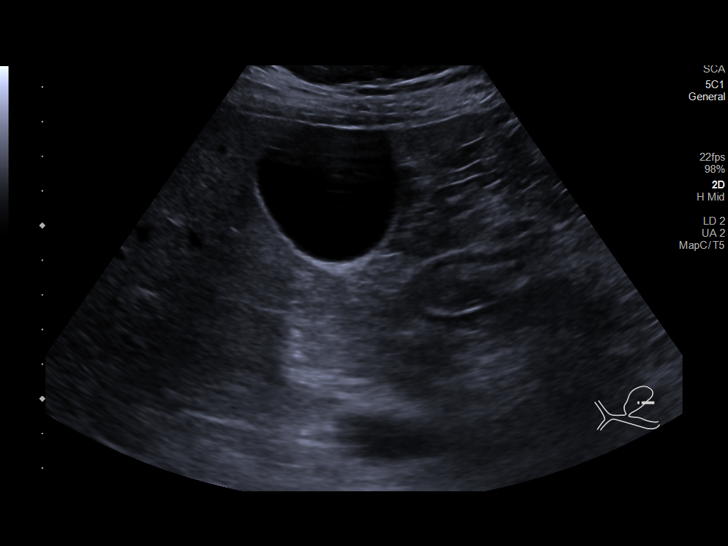
[im 14/30]
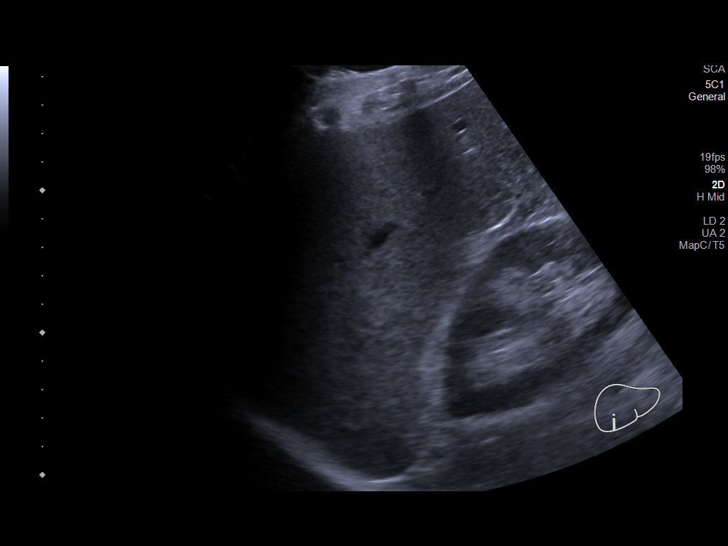
[im 16/30]
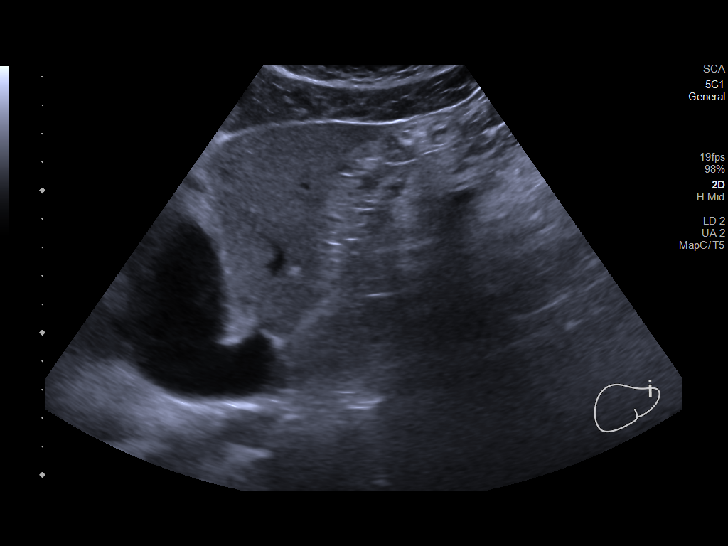
[im 19/30]
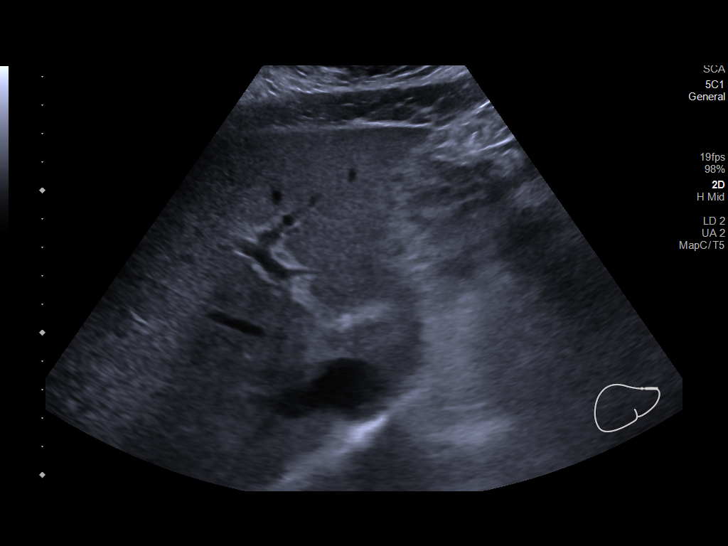
[im 20/30]
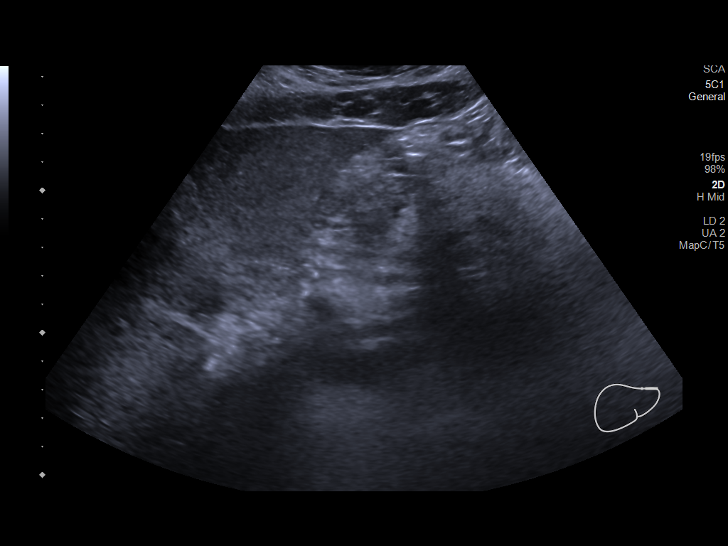
[im 22/30]
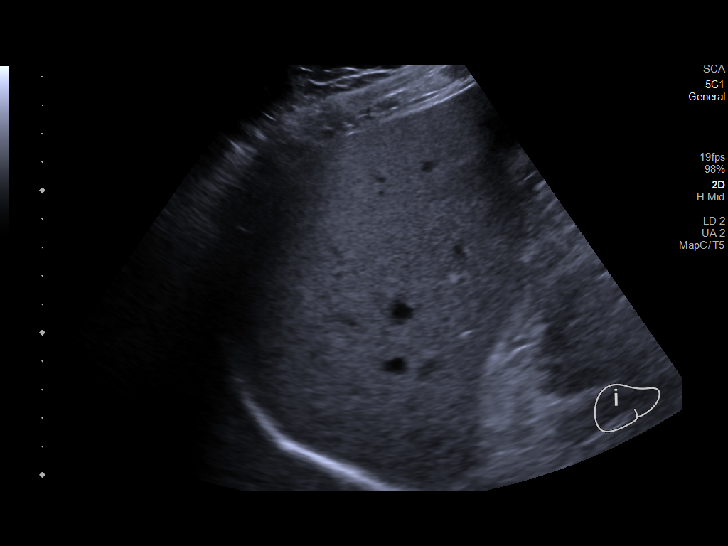
[im 25/30]
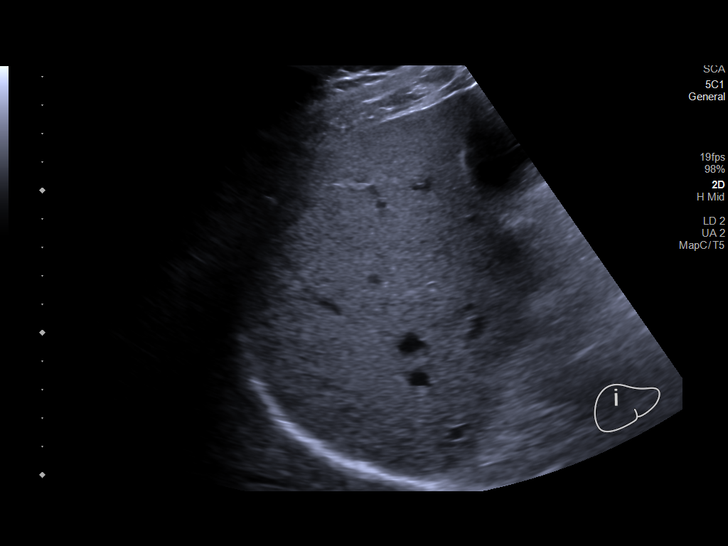
[im 27/30]
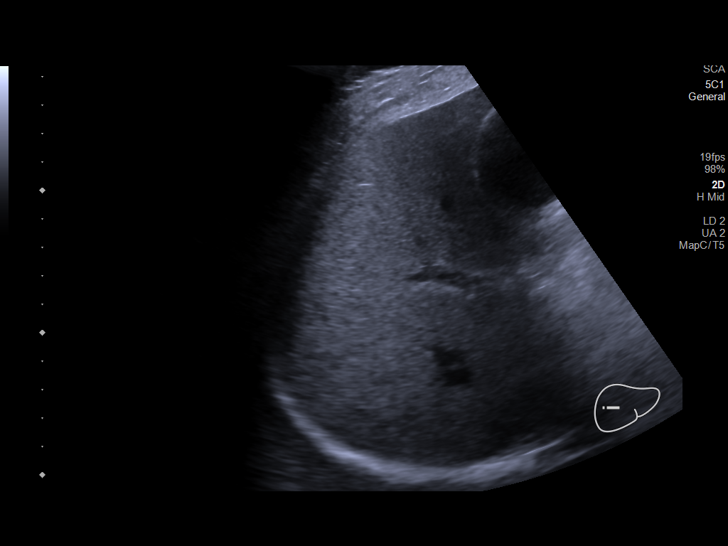
[im 30/30]
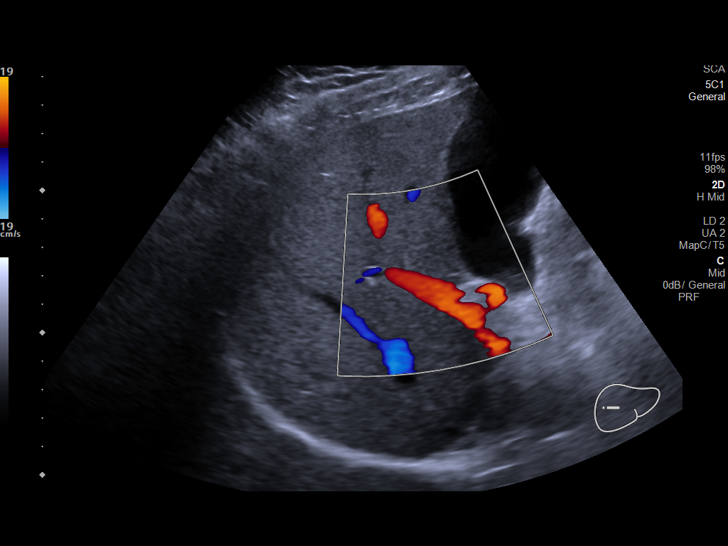

[14 of 25 positions shown; findings below may reference images not displayed]

FINDINGS: Gallbladder:

No gallstones or wall thickening visualized. No sonographic Murphy
sign noted by sonographer.

Common bile duct:

Diameter: 3 mm

Liver:

No focal lesion identified. Within normal limits in parenchymal
echogenicity. Portal vein is patent on color Doppler imaging with
normal direction of blood flow towards the liver.

Other: None.
IMPRESSION: Normal right upper quadrant abdominal ultrasound.

## 2022-08-23 IMAGING — DX DG CHEST 2V
2 series · 2 of 2 positions shown · non-contrast
Comparison: 04/04/2020

CLINICAL DATA: Chest pain

EXAM:
CHEST - 2 VIEW

[chest lat]
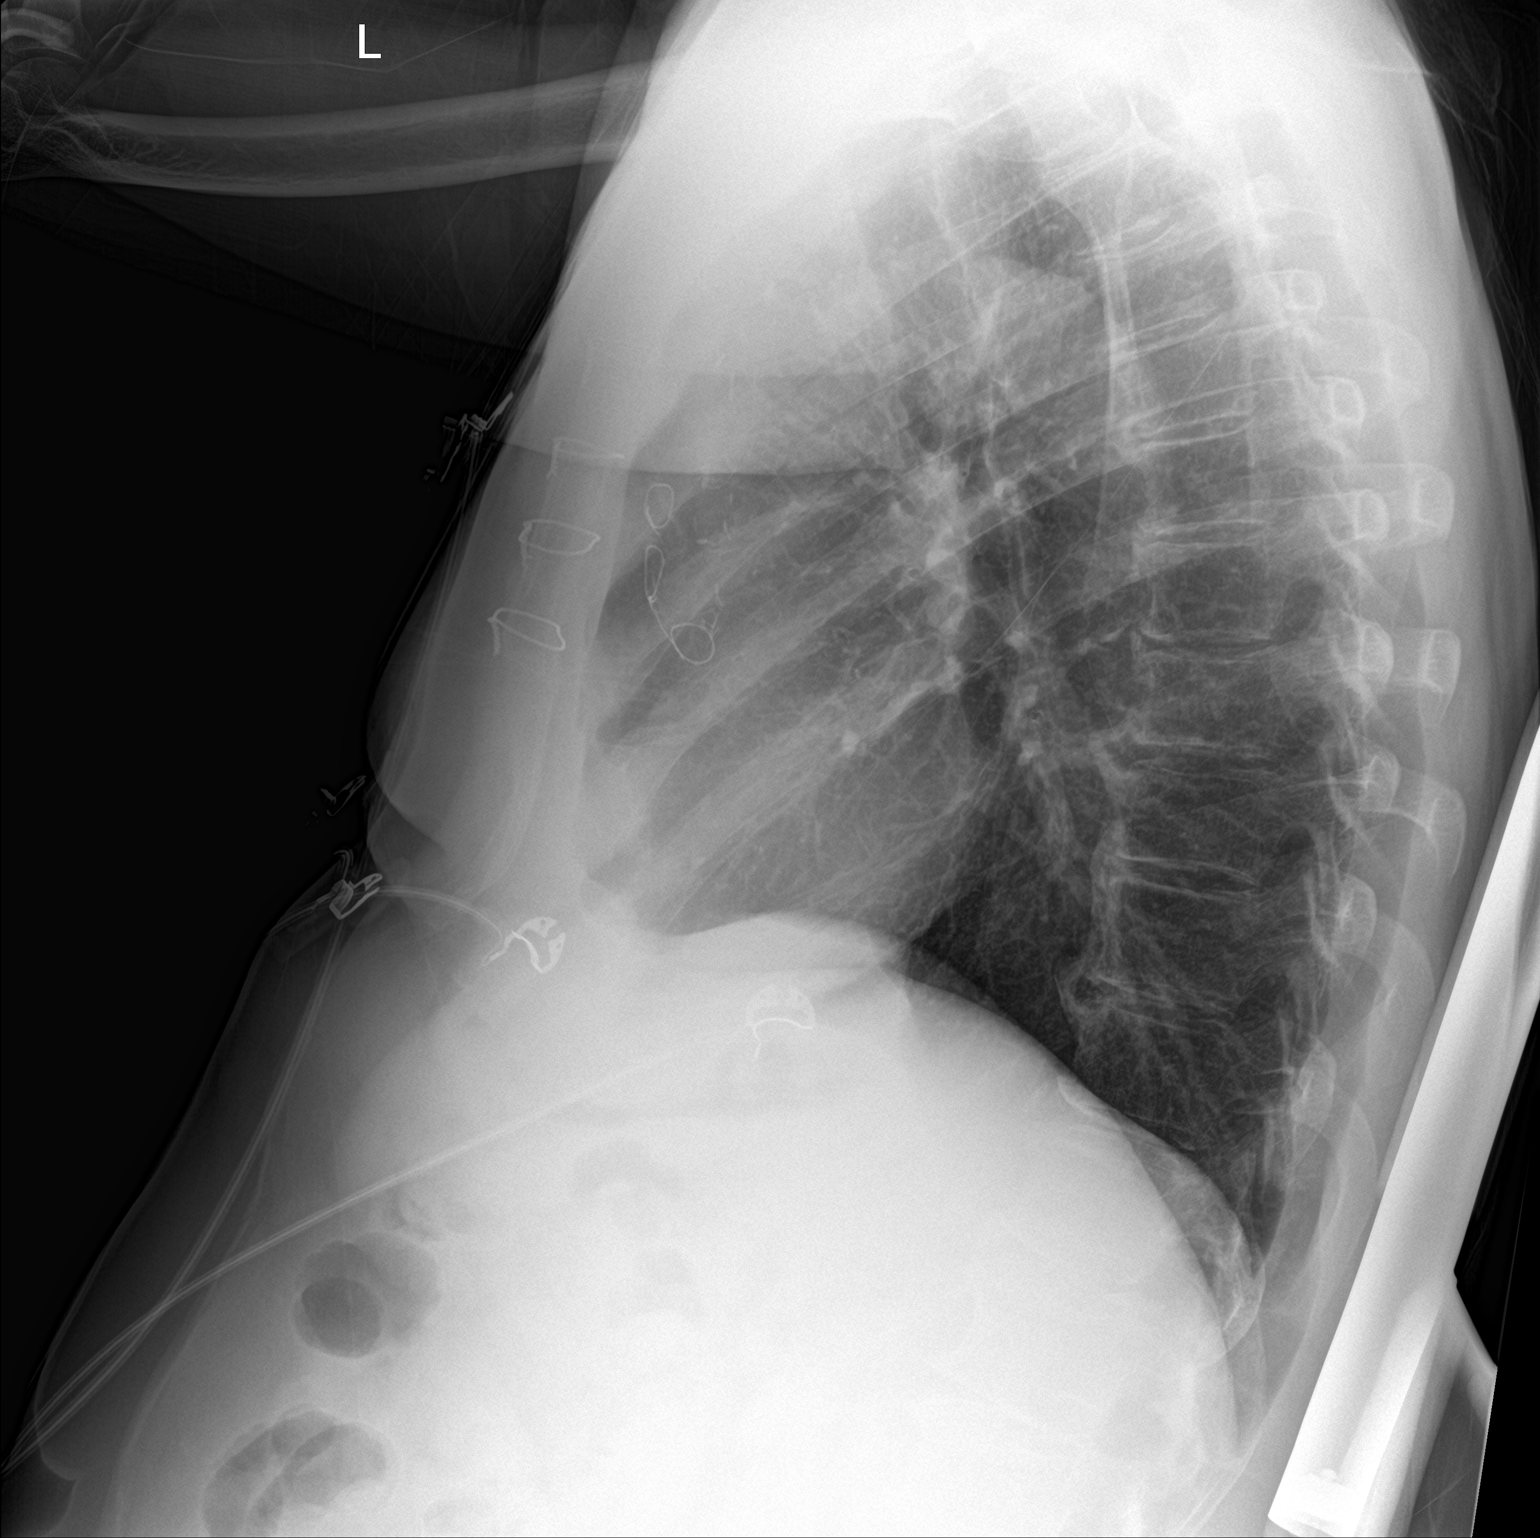

[chest ap]
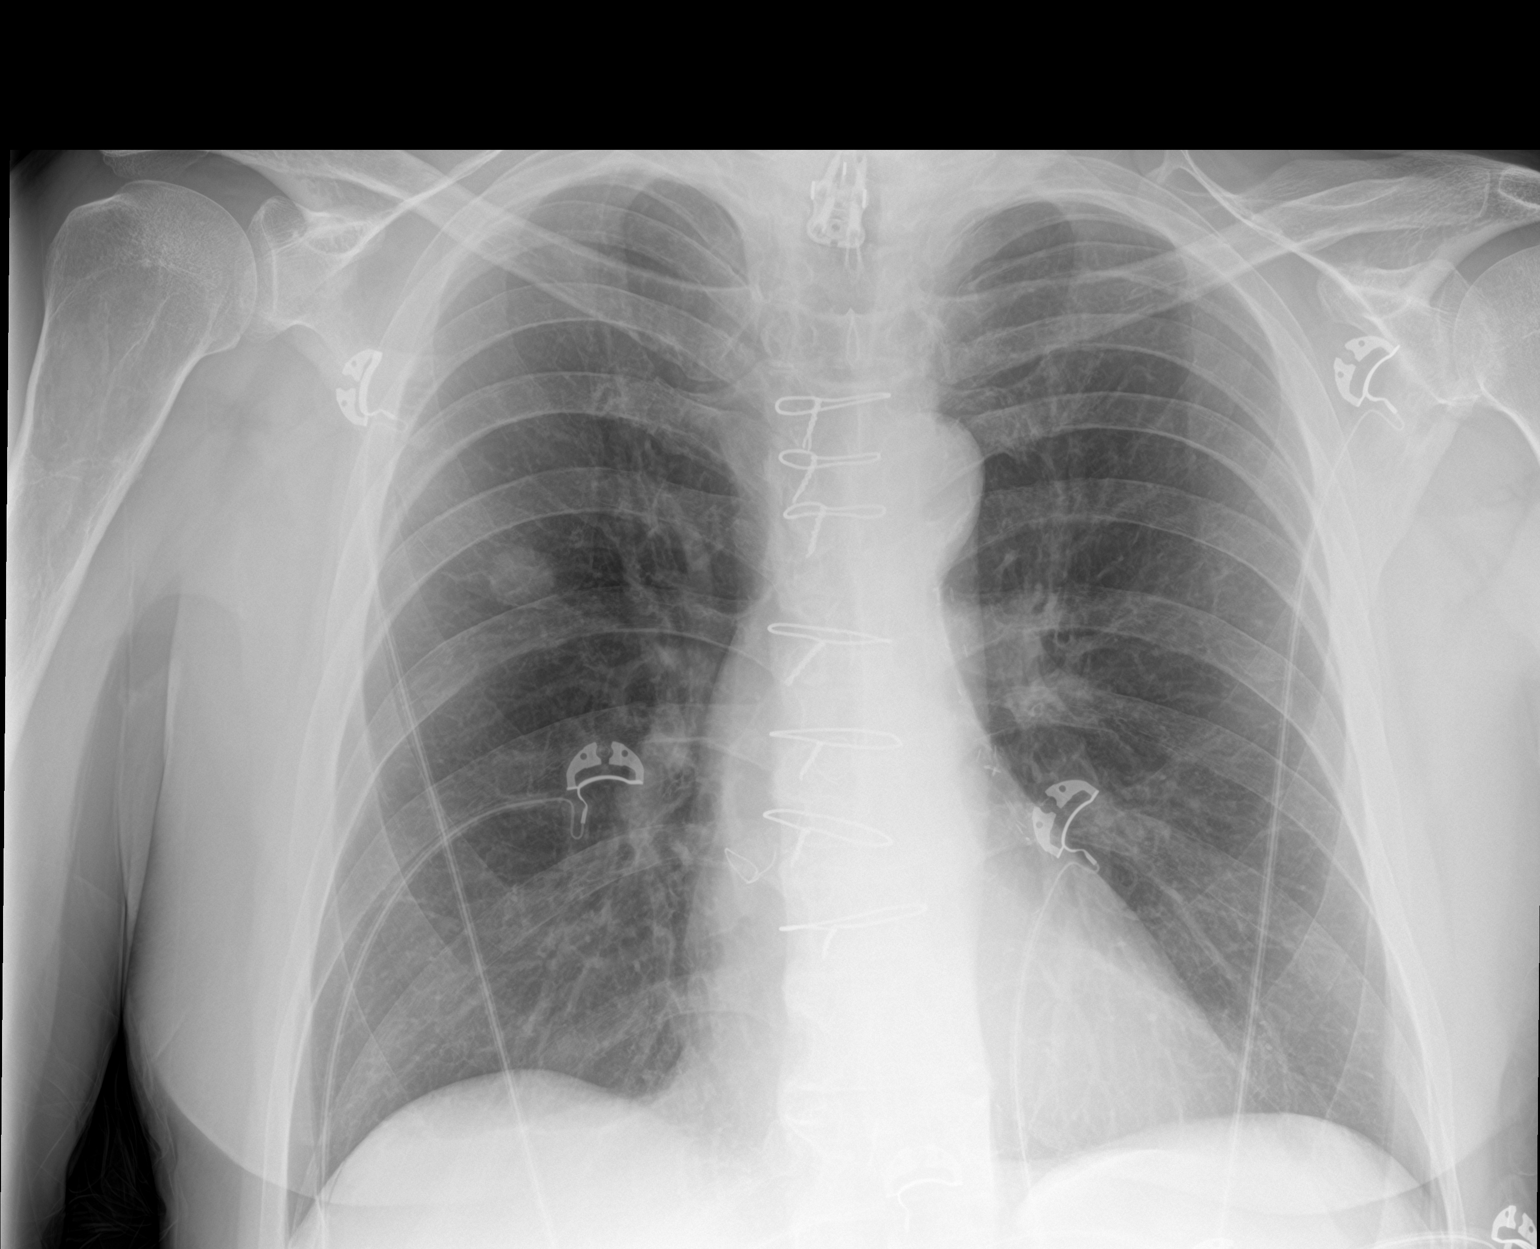

[2 of 2 positions shown; findings below may reference images not displayed]

FINDINGS: 17 mm nodular opacity overlying the right upper lobe. Left lung is
clear. No pleural effusion or pneumothorax.

The heart is normal in size. Postsurgical changes related to prior
CABG.

Median sternotomy. Cervical spine fixation hardware, incompletely
visualized. Degenerative changes of the thoracolumbar spine.
IMPRESSION: 17 mm nodular opacity overlying the right upper lobe, new. CT chest
with contrast is suggested for further evaluation.

## 2022-08-23 IMAGING — CT CT ANGIO CHEST
3 of 9 series · 18 of 36 positions shown · IV contrast (Omnipaque)
Comparison: Same day chest radiograph and CT a chest November 08, 2019.

CLINICAL DATA: Evaluation of pulmonary nodule seen on chest
radiograph

EXAM:
CT ANGIOGRAPHY CHEST WITH CONTRAST
TECHNIQUE: Multidetector CT imaging of the chest was performed using the
standard protocol during bolus administration of intravenous
contrast. Multiplanar CT image reconstructions and MIPs were
obtained to evaluate the vascular anatomy.
CONTRAST:  86mL OMNIPAQUE IOHEXOL 350 MG/ML SOLN

[Series 5: pe thins · axial · 0.84mm/px · z∈[-266,-3]mm · 14 of 305 slices shown]
[im 21/305  lung]
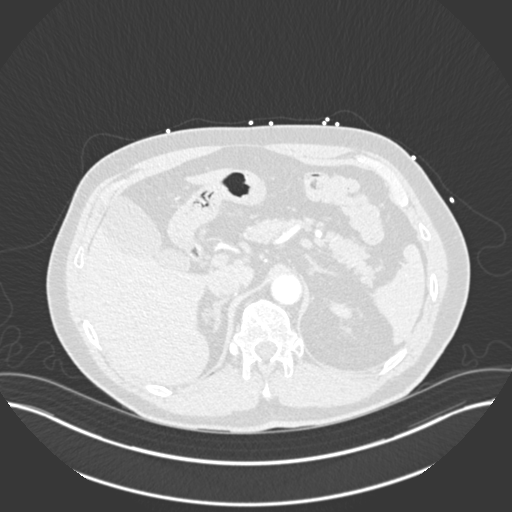
[im 41/305  mediastinal]
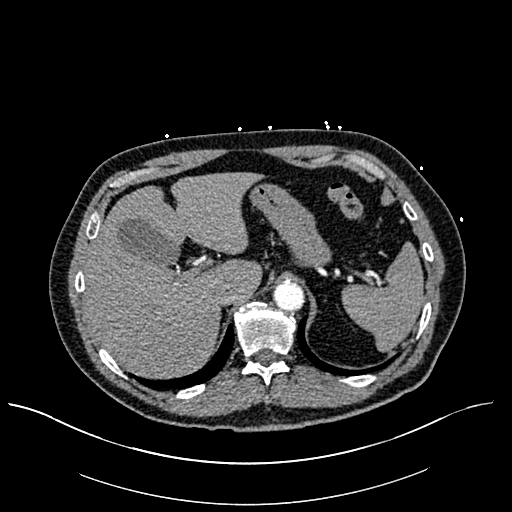
[im 61/305  lung]
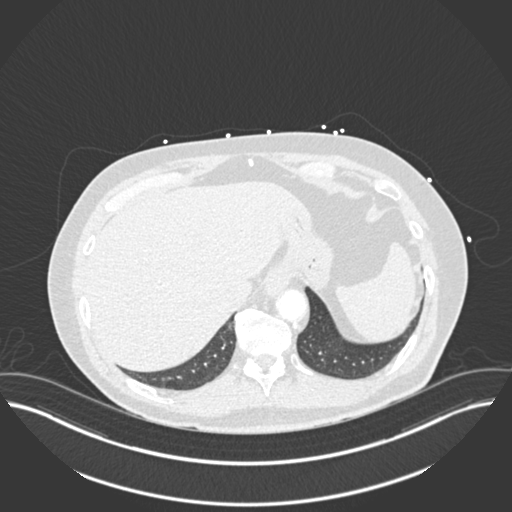
[im 82/305  mediastinal]
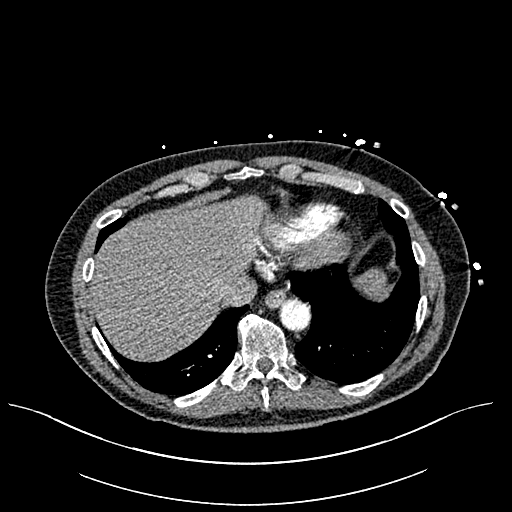
[im 102/305  lung]
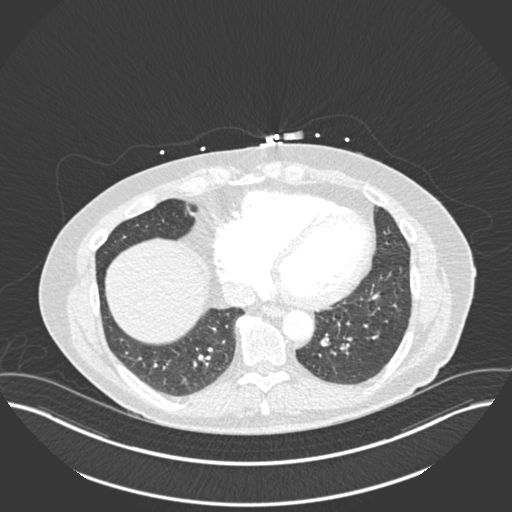
[im 122/305  mediastinal]
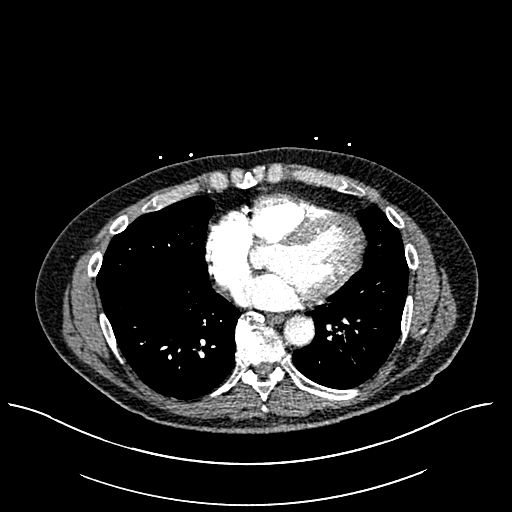
[im 142/305  lung]
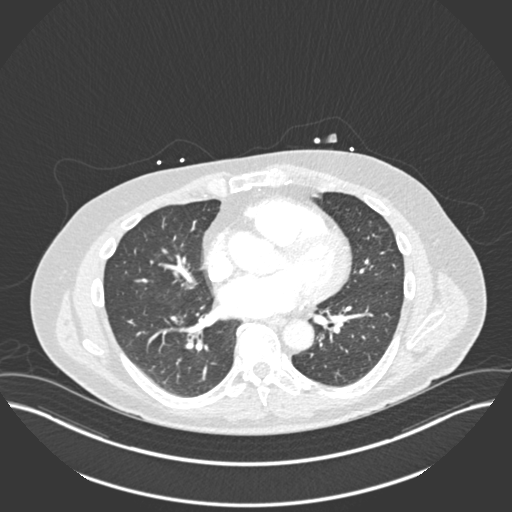
[im 163/305  mediastinal]
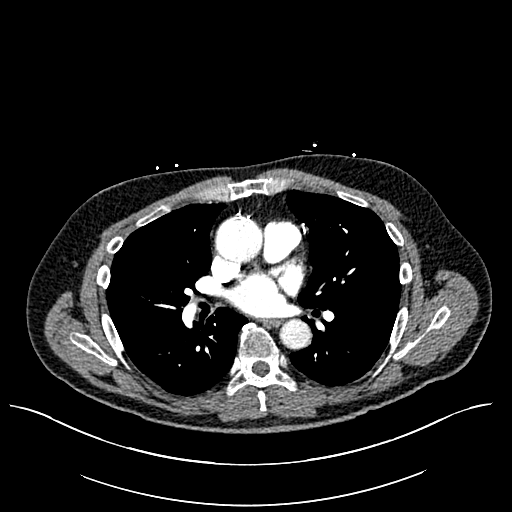
[im 183/305  lung]
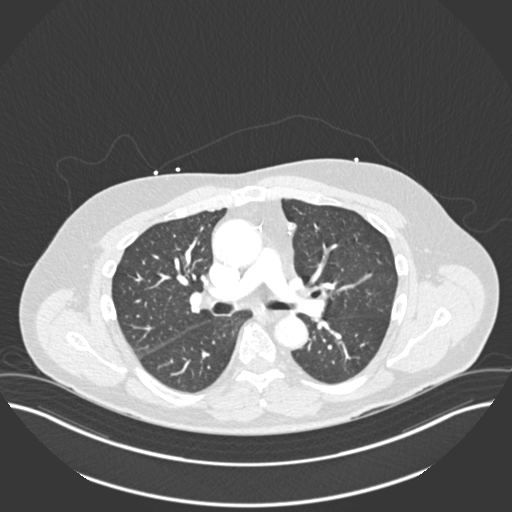
[im 203/305  mediastinal]
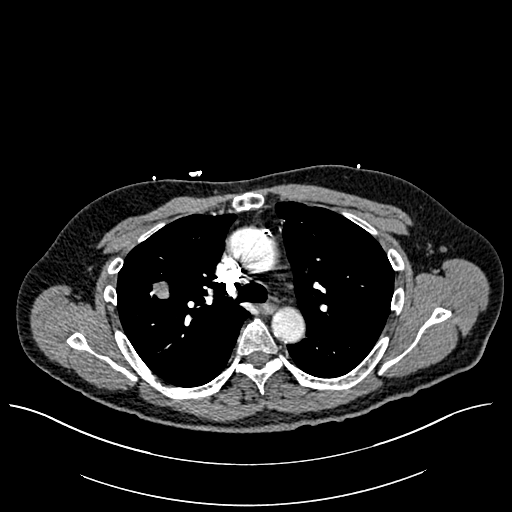
[im 223/305  lung]
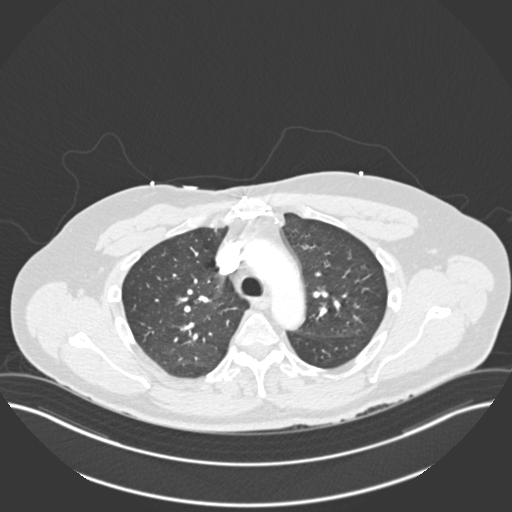
[im 244/305  mediastinal]
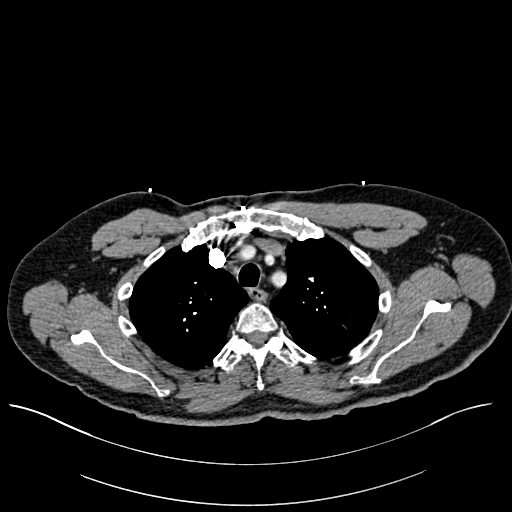
[im 264/305  lung]
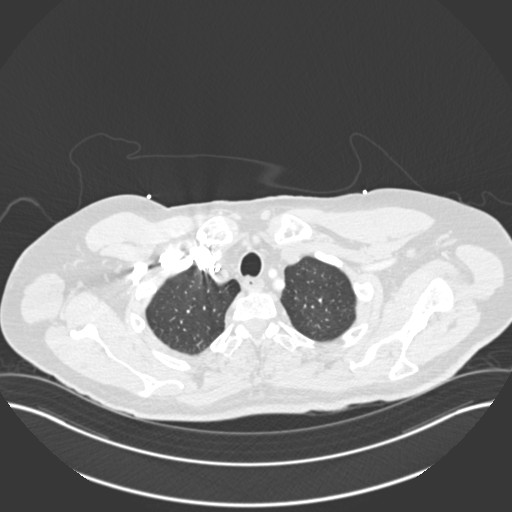
[im 284/305  mediastinal]
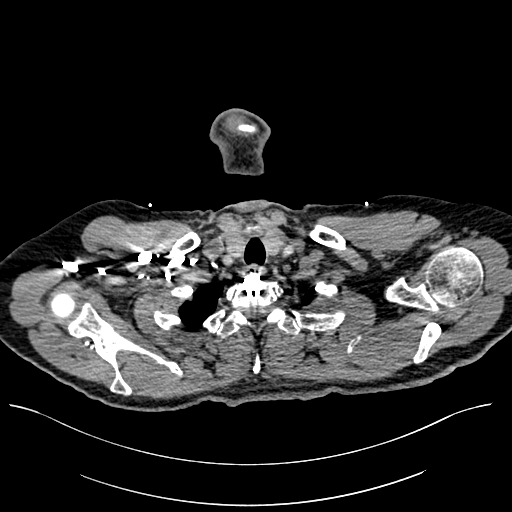

[Series 6: pe lung · axial · 0.95mm/px · z∈[-189,-51]mm · 3 of 92 slices shown]
[im 23/92  mediastinal]
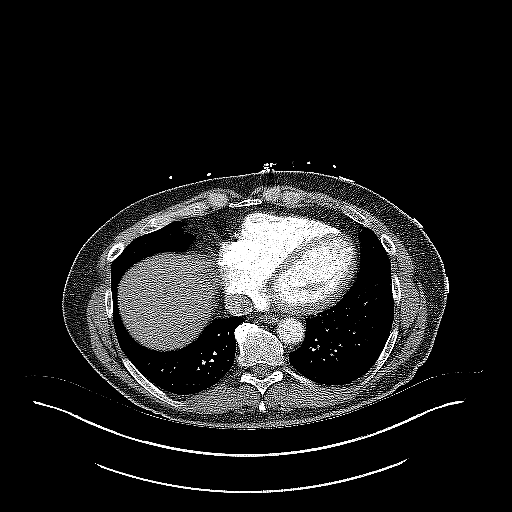
[im 46/92  mediastinal]
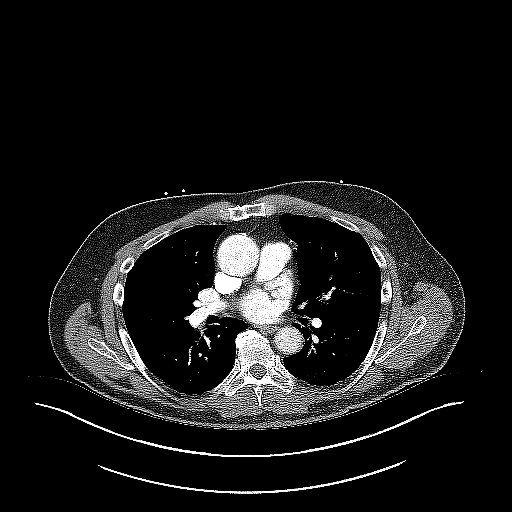
[im 69/92  mediastinal]
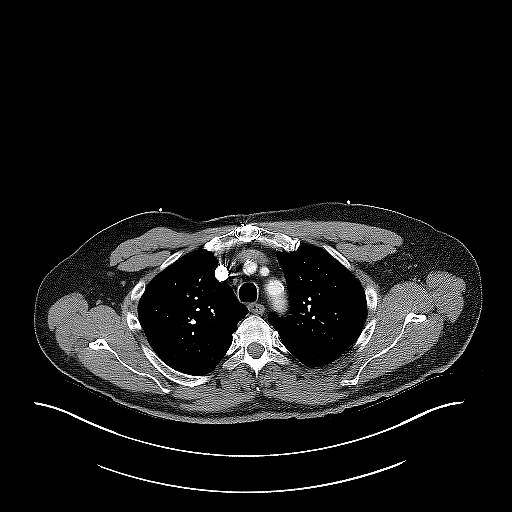

[Series 7: pe coronal mpr · coronal · 0.62mm/px · 1 of 132 slices shown]
[im 66/132  mediastinal]
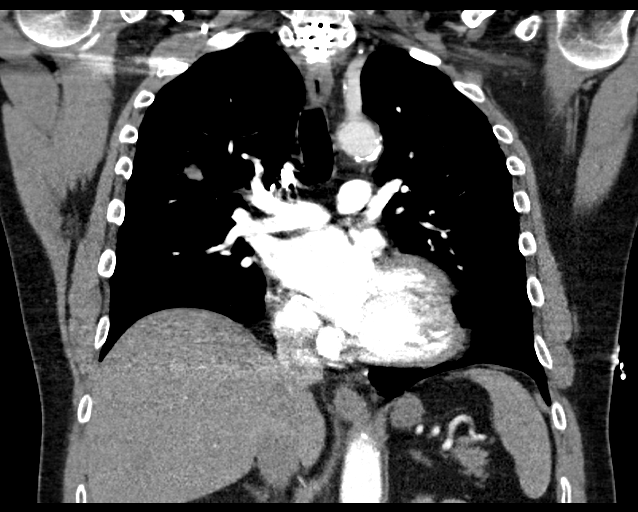

[18 of 36 positions shown; findings below may reference images not displayed]

FINDINGS: Cardiovascular: Satisfactory opacification of the pulmonary arteries
to the segmental level. No evidence of pulmonary embolism. Changes
of prior CABG. No thoracic aortic aneurysm. Aortic atherosclerosis.

Mediastinum/Nodes: No pathologically enlarged mediastinal, hilar or
axillary lymph nodes. Visualized portions of the thyroid are
unremarkable. Esophagus and trachea are grossly unremarkable.

Lungs/Pleura: Solid lobular 1.7 cm right upper lobe pulmonary
nodule. No pleural effusion. No pneumothorax.

Upper Abdomen: No acute abnormality. Anterior fusion hardware at
C7-T1. Prior median sternotomy.

Musculoskeletal: Anterior fusion hardware at C7-T1. Prior median
sternotomy. No suspicious lytic or blastic lesion of bone.

Review of the MIP images confirms the above findings.
IMPRESSION: 1. Solid lobular 1.7 cm right upper lobe pulmonary nodule,
suspicious for primary bronchogenic carcinoma. Recommend PET-CT, or
tissue sampling for further evaluation. This recommendation follows
the consensus statement: Guidelines for Management of Incidental
Pulmonary Nodules Detected on CT Images: From the [HOSPITAL]
2. No evidence of pulmonary embolus.
3. Aortic atherosclerosis.  Aortic Atherosclerosis (L74ZL-YBJ.J).

## 2022-08-30 DEATH — deceased

## 2022-09-10 ENCOUNTER — Ambulatory Visit: Payer: Medicare Other | Admitting: Interventional Cardiology

## 2022-09-16 ENCOUNTER — Ambulatory Visit: Payer: Medicare Other | Admitting: Physical Medicine and Rehabilitation

## 2022-09-17 IMAGING — CT CT ABD-PELV W/ CM
2 of 5 series · 16 of 46 positions shown, 18 images · IV contrast (Omnipaque)
Comparison: Chest CTA 10/24/2020.

CLINICAL DATA: 64-year-old male with hepatitis C. Epigastric and
abdominal pain radiating to the back since 8788 hours. Suspicious
right upper lobe lung mass/nodule on CTA last month.

EXAM:
CT ABDOMEN AND PELVIS WITH CONTRAST
TECHNIQUE: Multidetector CT imaging of the abdomen and pelvis was performed
using the standard protocol following bolus administration of
intravenous contrast.
CONTRAST:  100mL OMNIPAQUE IOHEXOL 300 MG/ML  SOLN

[Series 2: axial st · axial · 0.91mm/px · z∈[-535,-50]mm · 13 of 109 slices shown, 15 images]
[im 6/109  soft-tissue]
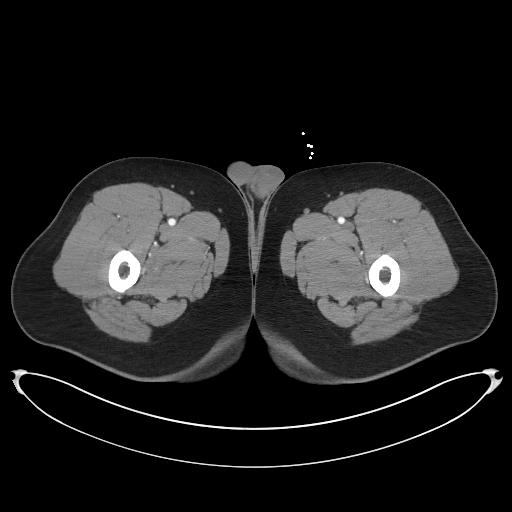
[im 6/109  bone]
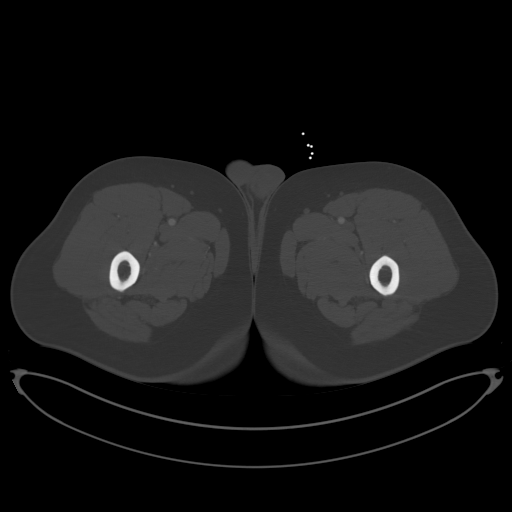
[im 18/109  soft-tissue]
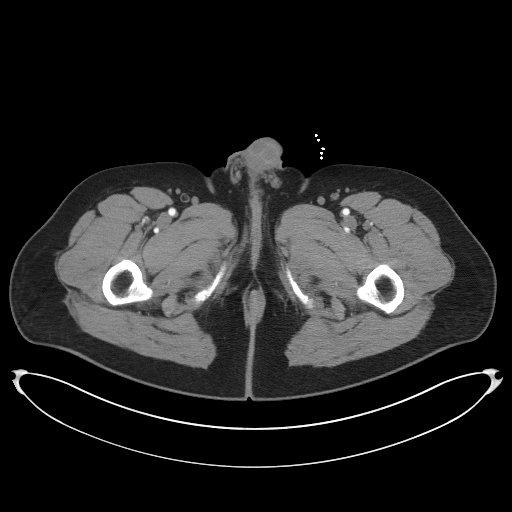
[im 23/109  soft-tissue]
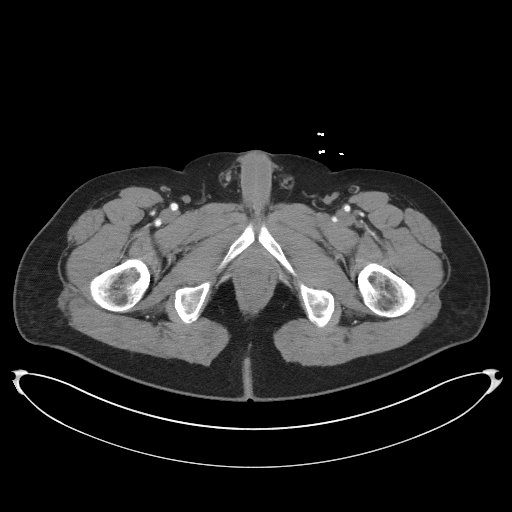
[im 29/109  soft-tissue]
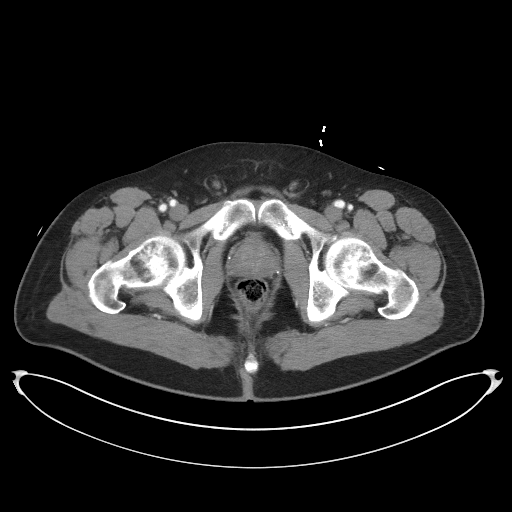
[im 40/109  soft-tissue]
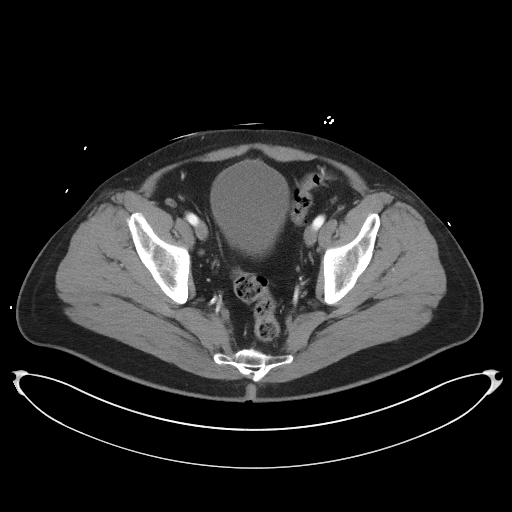
[im 46/109  soft-tissue]
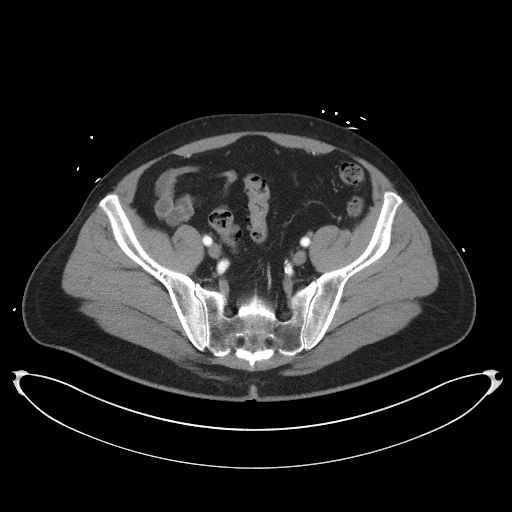
[im 57/109  soft-tissue]
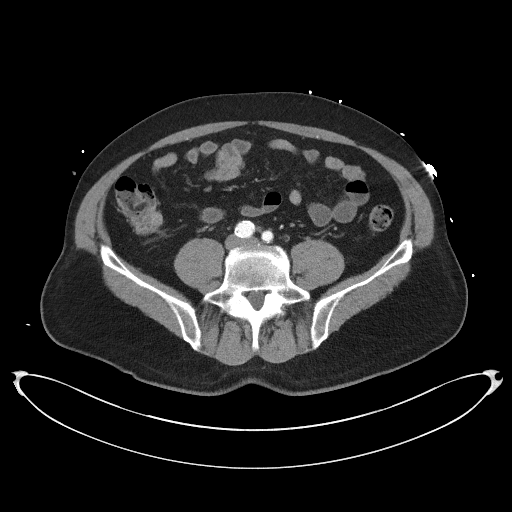
[im 63/109  soft-tissue]
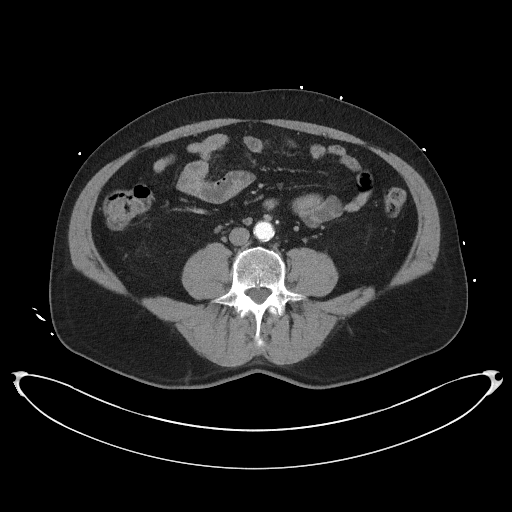
[im 69/109  soft-tissue]
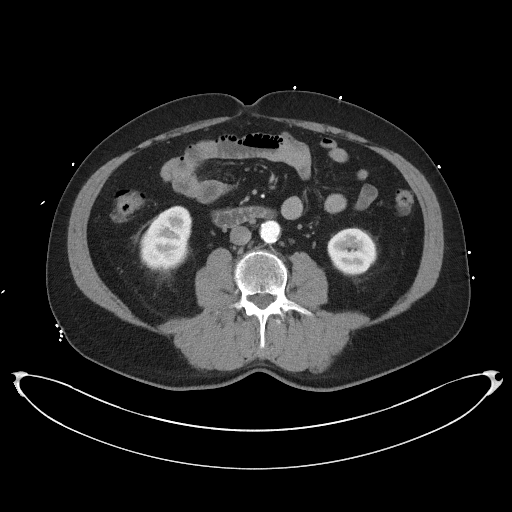
[im 69/109  bone]
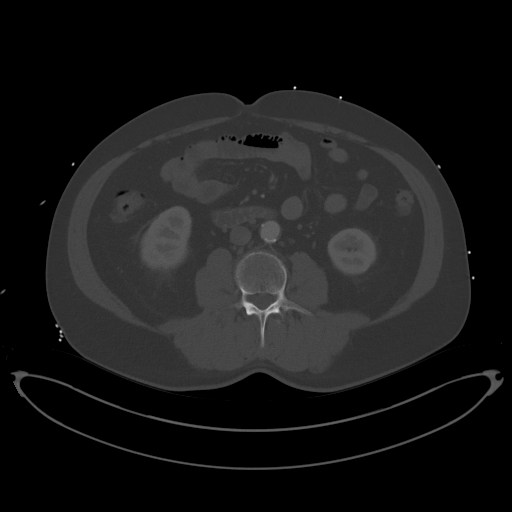
[im 80/109  soft-tissue]
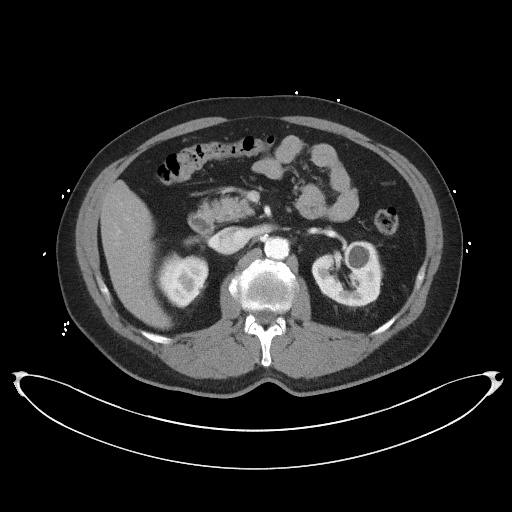
[im 86/109  soft-tissue]
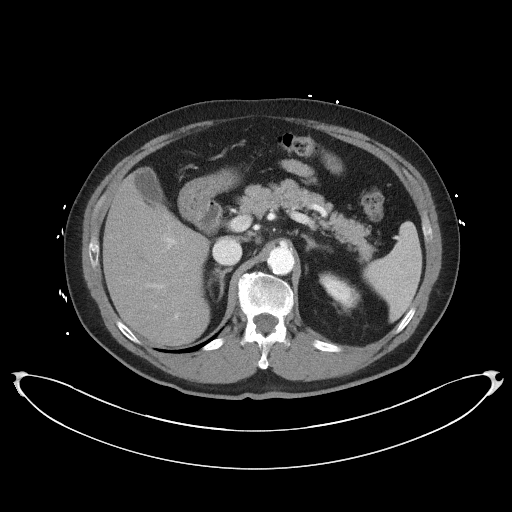
[im 91/109  soft-tissue]
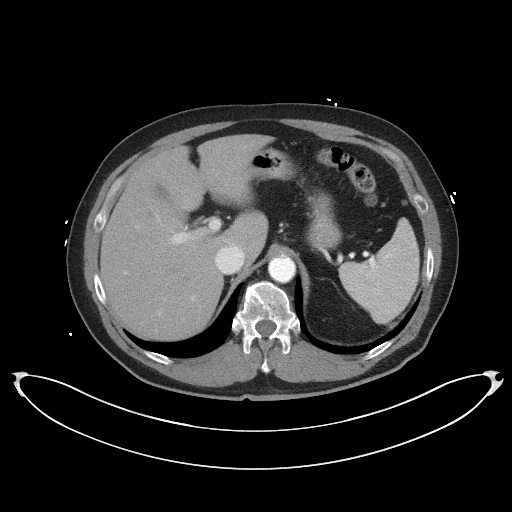
[im 103/109  soft-tissue]
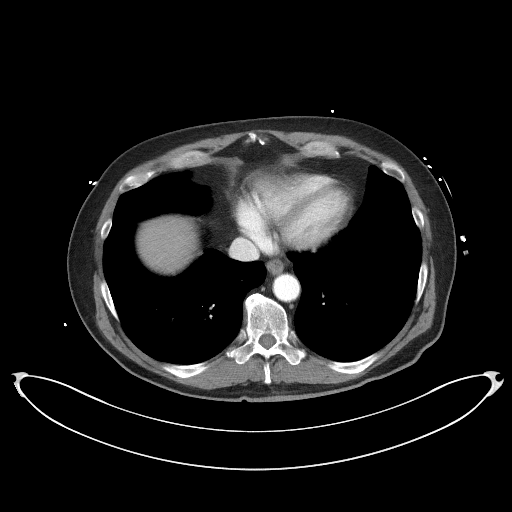

[Series 5: coronal st · coronal · 0.89mm/px · 3 of 94 slices shown]
[im 32/94  soft-tissue]
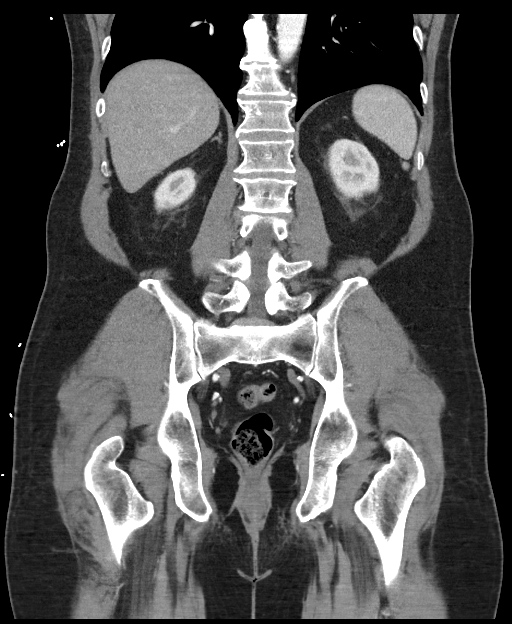
[im 42/94  soft-tissue]
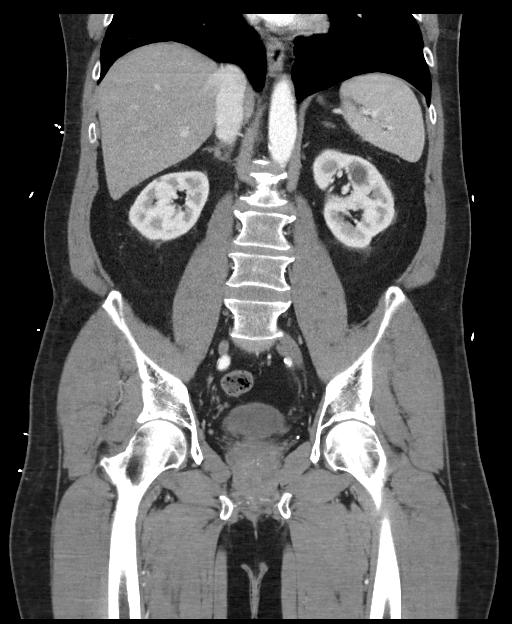
[im 52/94  soft-tissue]
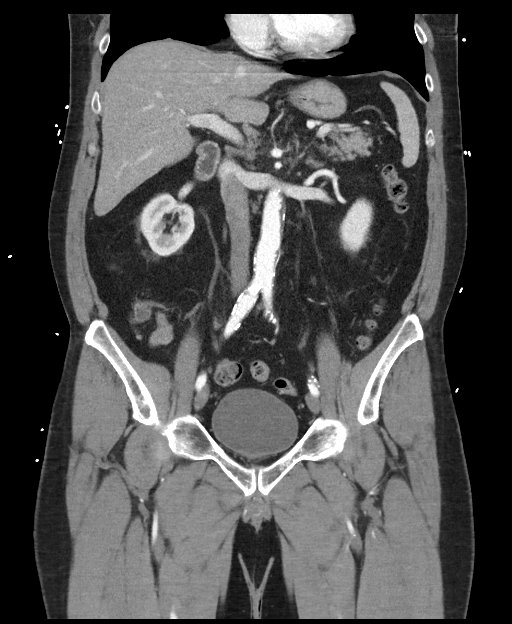

[16 of 46 positions shown; findings below may reference images not displayed]

Noncontrast CT Abdomen and Pelvis
05/27/2020. CT Abdomen and Pelvis with contrast 07/17/2019.
FINDINGS: Lower chest: Lung bases are stable and negative. No pericardial or
pleural effusion.

Hepatobiliary: Stable liver since 4242, with evidence of mild
hepatic steatosis and a punctate low-density area at the dome which
is likely a tiny benign cyst. Negative gallbladder. No bile duct
enlargement.

Pancreas: Negative.

Spleen: Negative.  No splenomegaly.

Adrenals/Urinary Tract: Normal adrenal glands.

Kidneys appears stable since 4242 and negative aside from punctate
right upper pole nephrolithiasis. Small chronic left renal upper
pole cyst. No nephrolithiasis. Symmetric renal contrast excretion.
Negative ureters and bladder.

Stomach/Bowel: Mild diverticulosis in the sigmoid colon. Otherwise
negative large bowel. Normal appendix on series 2, image 57.
Negative terminal ileum. No dilated small bowel. Decompressed
stomach. No free air, free fluid, mesenteric inflammation.

Vascular/Lymphatic: Aortoiliac calcified atherosclerosis. Major
arterial structures are patent. Portal venous system is patent. No
lymphadenopathy.

Reproductive: Negative.

Other: No pelvic free fluid.

Musculoskeletal: Chronic or congenital left L5 posterior element
defect. No acute or suspicious osseous lesion.
IMPRESSION: 1. No acute, inflammatory, or metastatic process in the abdomen or
pelvis.
2. Mild chronic hepatic steatosis. Punctate right nephrolithiasis.
Mild sigmoid diverticulosis. Aortic Atherosclerosis ([id number]).

## 2022-09-17 IMAGING — DX DG CHEST 1V PORT
1 series · 1 of 1 positions shown · non-contrast
Comparison: Chest CTA 10/24/2020 and earlier.

CLINICAL DATA: 64-year-old male with epigastric pain radiating to
the back. Suspected right upper lobe lung cancer on chest CTA last
month.

EXAM:
PORTABLE CHEST 1 VIEW

[chest ap]
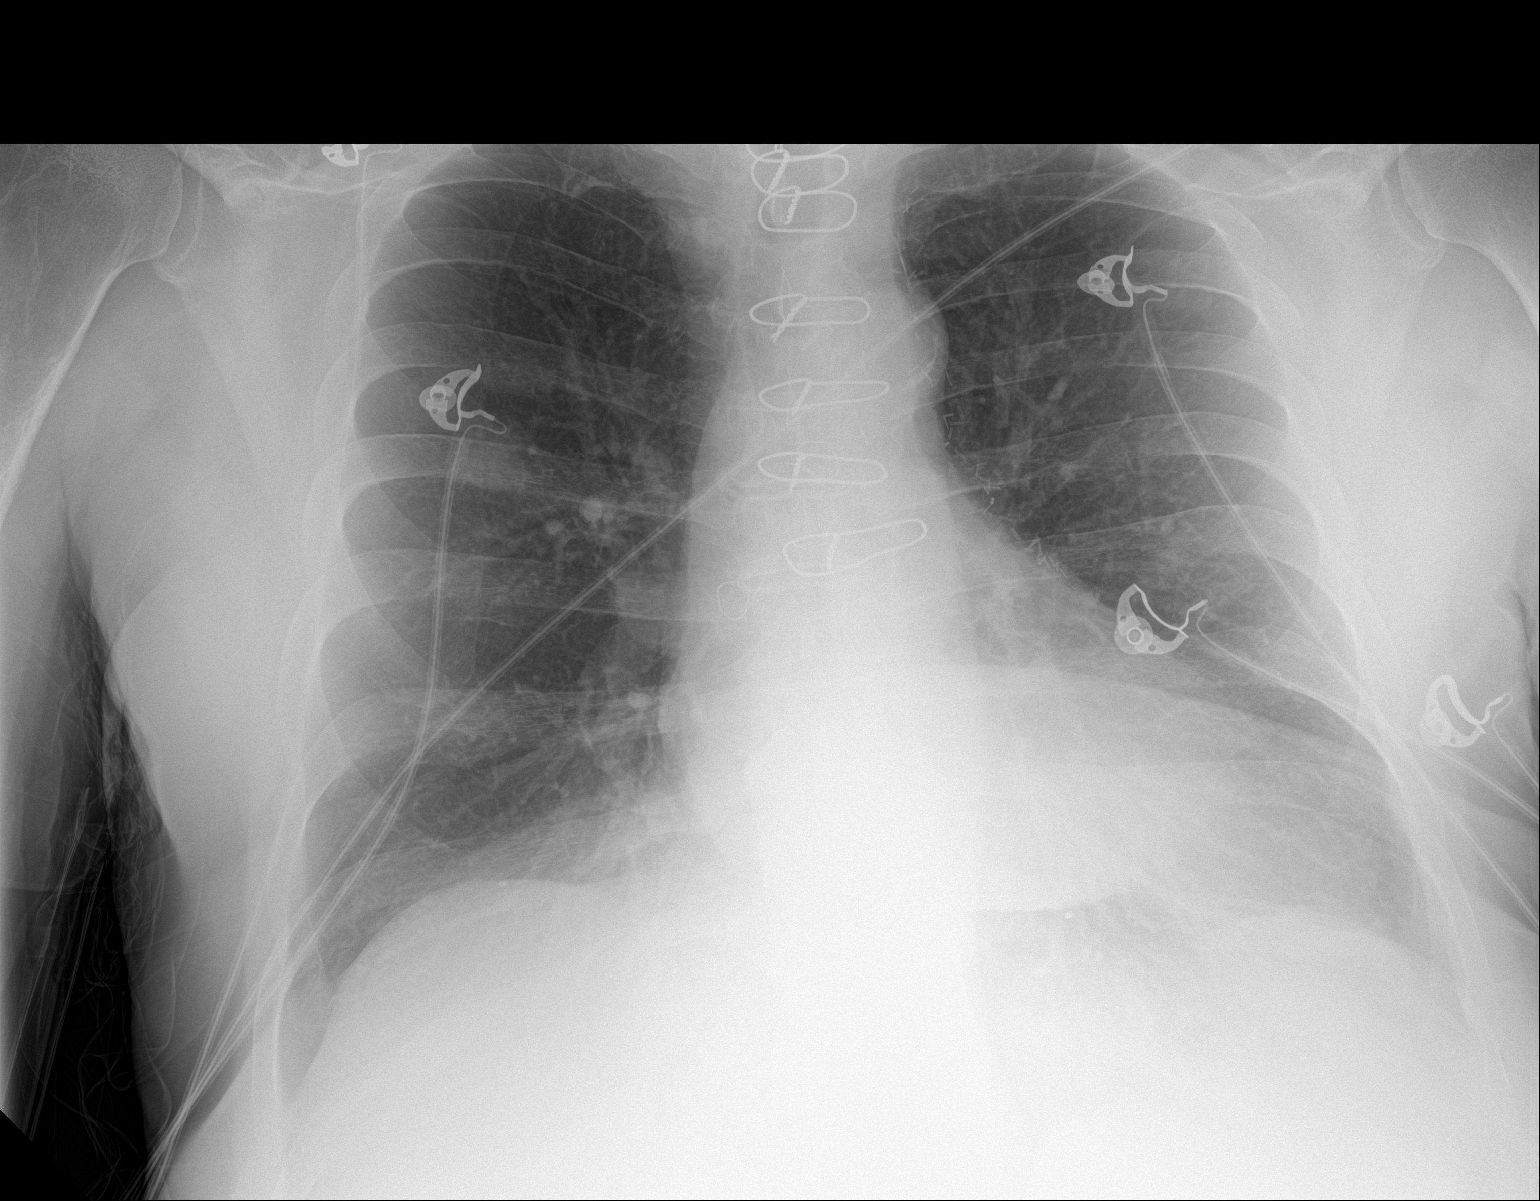

[1 of 1 positions shown; findings below may reference images not displayed]

FINDINGS: Portable AP view at 7420 hours. Lordotic positioning today. Right
upper lobe lung nodule projects over the posterior 6th rib on this
image. Mediastinal contours remain normal. Prior sternotomy. No
pneumothorax, pulmonary edema, pleural effusion or new pulmonary
opacity. Stable visualized osseous structures. Lower cervical ACDF.
Paucity of bowel gas in the upper abdomen.
IMPRESSION: Known right upper lobe lung nodule/mass. No new cardiopulmonary
abnormality.

## 2022-09-20 ENCOUNTER — Ambulatory Visit: Payer: Medicare Other | Admitting: Physical Medicine and Rehabilitation

## 2022-09-26 IMAGING — PT NM PET TUM IMG INITIAL (PI) SKULL BASE T - THIGH
7 series · 25 of 25 positions shown · non-contrast
Comparison: October 24, 2020

CLINICAL DATA: Initial treatment strategy for lung nodule.

EXAM:
NUCLEAR MEDICINE PET SKULL BASE TO THIGH
TECHNIQUE: 9.81 mCi F-18 FDG was injected intravenously. Full-ring PET imaging
was performed from the skull base to thigh after the radiotracer. CT
data was obtained and used for attenuation correction and anatomic
localization.
Fasting blood glucose: 105 mg/dl

[Series 3: pet sk_thigh ac · axial · 5.0mm · 4.07mm/px · z∈[-918,+10]mm · 6 of 233 slices shown]
[im 1/233]
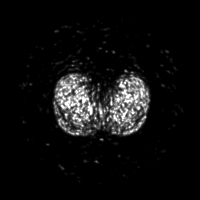
[im 47/233]
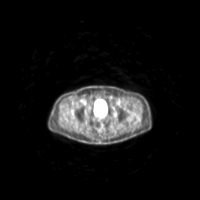
[im 93/233]
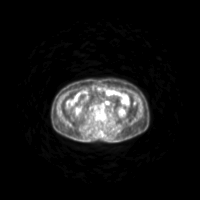
[im 140/233]
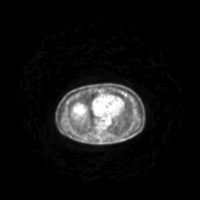
[im 186/233]
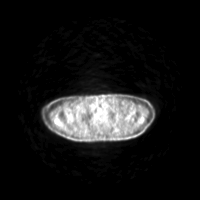
[im 233/233]
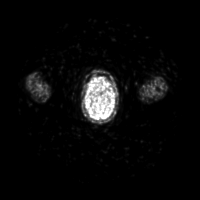

[Series 4: ct sk_thigh 5.0 bf37 · axial · 5.0mm · 0.98mm/px · z∈[-918,+10]mm · 5 of 233 slices shown]
[im 1/233]
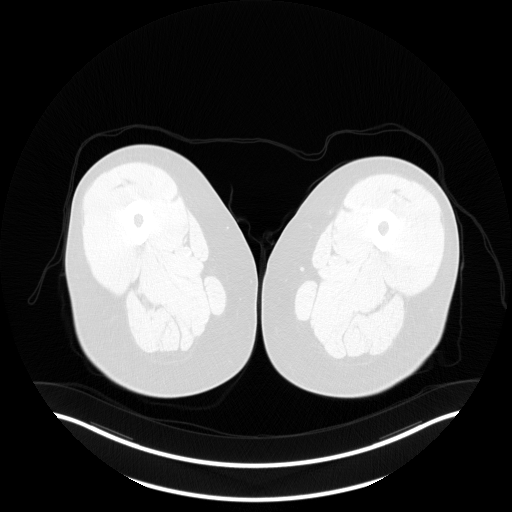
[im 59/233]
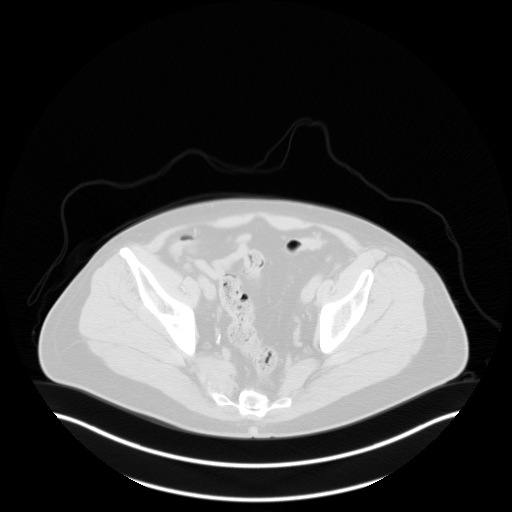
[im 117/233]
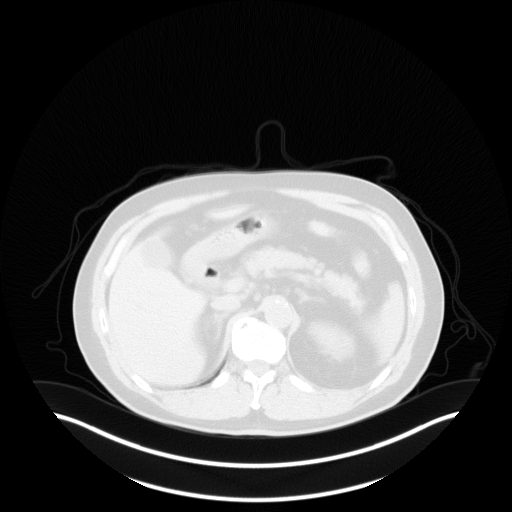
[im 175/233]
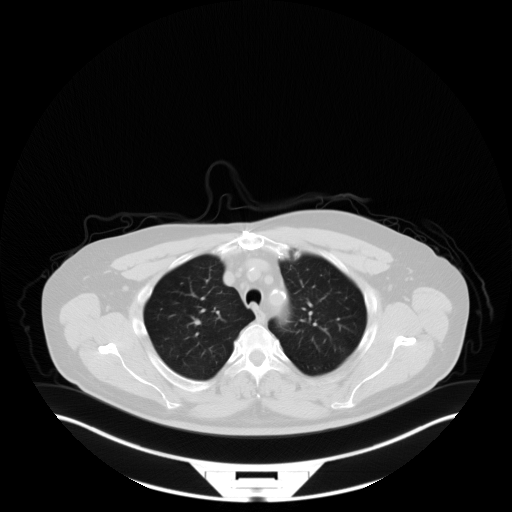
[im 233/233  brain]
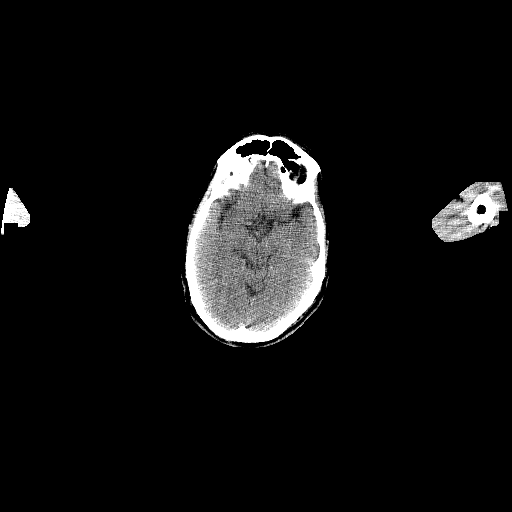

[Series 5: pet sk_thigh nac · axial · 5.0mm · 4.07mm/px · z∈[-918,+10]mm · 5 of 233 slices shown]
[im 1/233]
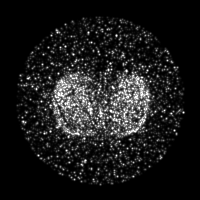
[im 59/233]
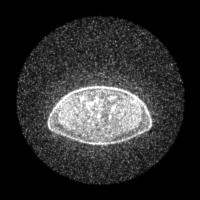
[im 117/233]
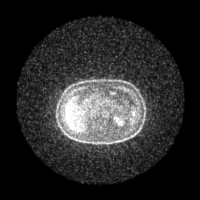
[im 175/233]
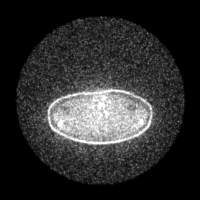
[im 233/233]
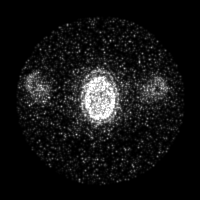

[Series 8: ct sk_thigh 5.0 br59 lung_bone · axial · 5.0mm · 0.70mm/px · z∈[-460,-152]mm · 2 of 78 slices shown]
[im 1/78]
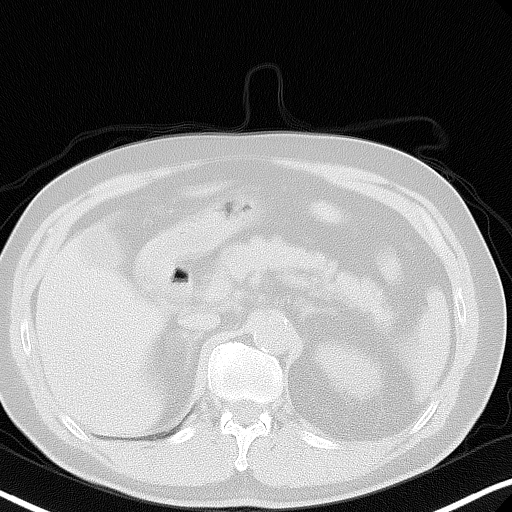
[im 78/78]
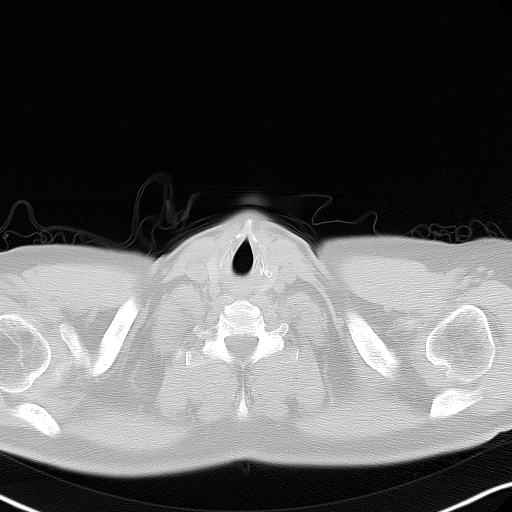

[Series 603: fused cor · 1 of 46 slices shown]
[im 1/46]
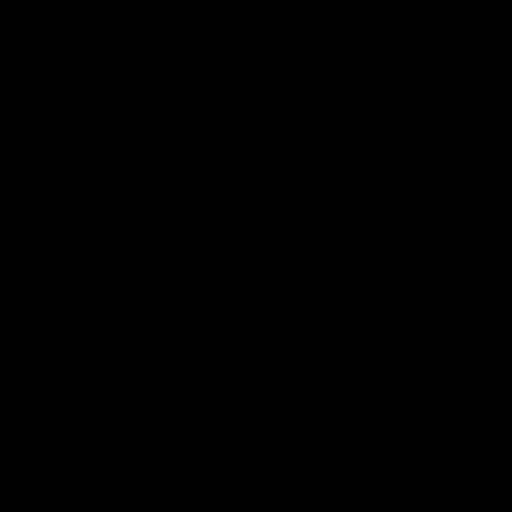

[Series 604: <mip collection> · coronal · 1.93mm/px · 1 of 32 slices shown]
[im 1/32]
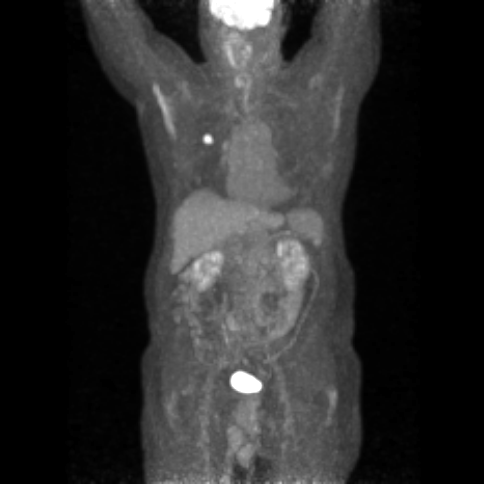

[Series 605: range-ct sk_thigh 5.0 bf37-tra-<alpha range> · 5 of 227 slices shown]
[im 1/227]
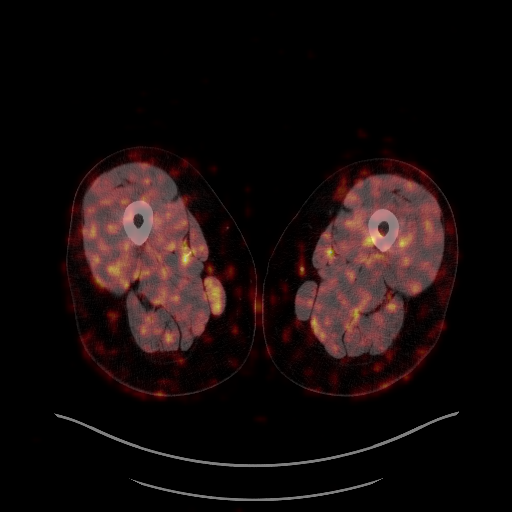
[im 57/227]
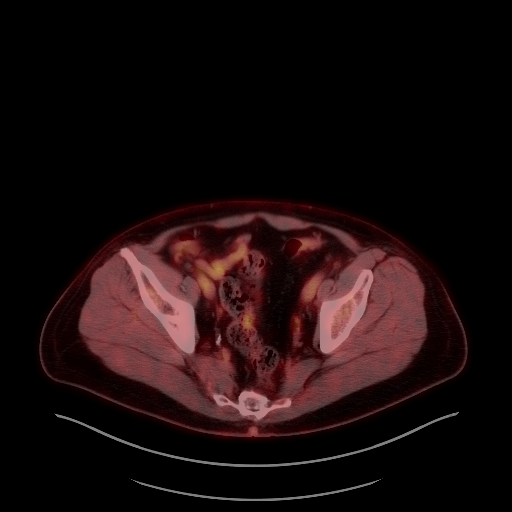
[im 114/227]
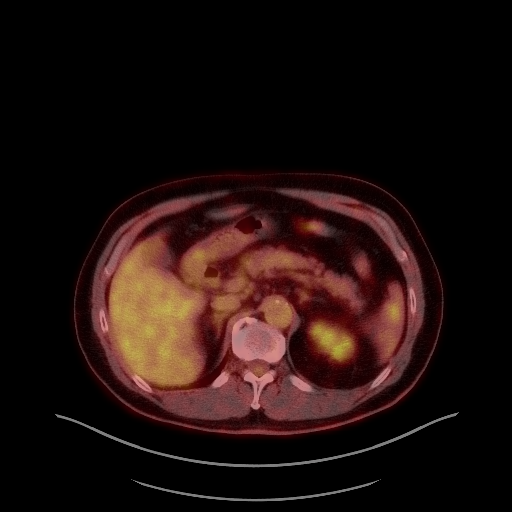
[im 170/227]
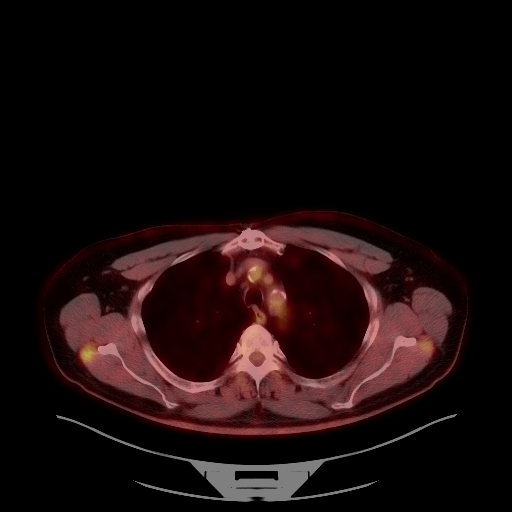
[im 227/227]
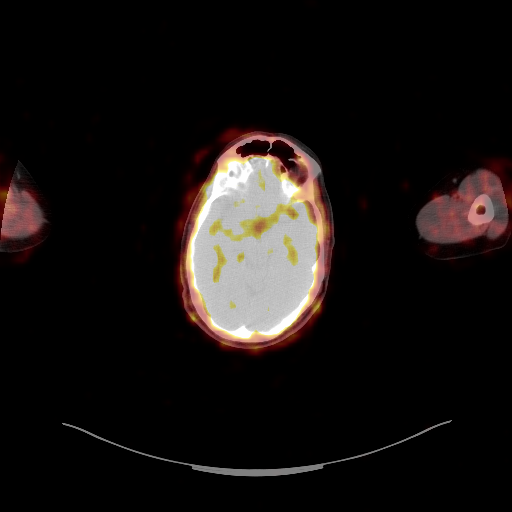

[25 of 25 positions shown; findings below may reference images not displayed]

FINDINGS: Mediastinal blood pool activity: SUV max

Liver activity: SUV max NA

NECK: There is misregistration artifact on the current PET. Native
PET images do show mildly asymmetric PET activity in the RIGHT oro
pharyngeal tonsillar tissue as compared to the LEFT. CT images show
mild asymmetry of soft tissue also on the RIGHT as compared to the
LEFT best exhibited on image 25 of series 4. Maximum SUV in this
area is approximately 6.8 as compared to 5.8 on the contralateral
side. There is a suggestion of either secretions overlying this area
or some ulceration on image 25 of series 4 as well.

Incidental CT findings: none

CHEST: RIGHT upper lobe nodule (image 27, series 8) approximately 2
x 1.9 cm, measurements difficult in terms of exact a mentions due to
respiratory motion. Maximum SUV, markedly hypermetabolic with
max SUV.

Mild increased metabolism about the RIGHT hilum max SUV of 3.3 on
image 71 of series 4. Difficult to localize discrete nodal tissue in
this location. There was some mildly prominent RIGHT hilar nodal
tissue on the previous CT examination.

Incidental CT findings: No consolidation. No pleural effusion. Signs
of median sternotomy for coronary revascularization. Focal uptake at
the GE junction is mild-to-moderate with a maximum SUV of 4.1.

ABDOMEN/PELVIS: No abnormal hypermetabolic activity within the
liver, pancreas, adrenal glands, or spleen. No hypermetabolic lymph
nodes in the abdomen or pelvis.

Incidental CT findings: Liver, gallbladder, pancreas, spleen,
adrenal glands and kidneys without acute process. Smooth contour of
the urinary bladder. No acute gastrointestinal findings. Normal
appendix. Colonic diverticulosis. Aortic atherosclerosis without
aneurysmal dilation of the abdominal aorta. Scattered small lymph
nodes throughout the retroperitoneum none with hypermetabolic
features.

SKELETON: No focal hypermetabolic activity to suggest skeletal
metastasis.

Incidental CT findings: Signs of median sternotomy. Signs of
anterior cervical discectomy and fusion at the cervicothoracic
junction. Spinal degenerative changes throughout the spine.

Areas of lucency with a central area of ground-glass attenuation in
the RIGHT proximal humerus, present in hindsight as far back as 4545
with narrow zone of transition, also seen on chest x-ray in 2990.

Mild asymmetric uptake about the RIGHT shoulder is favored to
represent muscular activity. Correlate with any pain in this
location.
IMPRESSION: RIGHT upper lobe nodule with marked hypermetabolic features and
moderate hypermetabolic lymph node in the RIGHT hilum suspicious for
primary bronchogenic neoplasm with potential involvement of RIGHT
hilar lymph nodes.

Focal activity at the GE junction without discernible mass. There is
some mild soft tissue fullness in this area. Consider esophagoscopy
if not recently performed for further evaluation, to exclude small
neoplasm.

Asymmetry of the RIGHT oropharynx, question of ulcerative changes
with mildly asymmetric uptake, direct visualization or CT of the
neck may be helpful for further assessment.

Benign-appearing bone lesion in the RIGHT proximal humerus
potentially aneurysmal bone cyst, present as far back as 2990.
Consider dedicated humeral evaluation as warranted, particularly if
there are symptoms of pain in this location on follow-up.

There is some mildly asymmetric FDG uptake outside of the bone
within musculature about the RIGHT shoulder. Correlate with any pain
in this location.

These results will be called to the ordering clinician or
representative by the Radiologist Assistant, and communication
documented in the PACS or [REDACTED].

## 2022-10-11 IMAGING — CT CT CHEST SUPER D W/O CM
2 of 5 series · 15 of 36 positions shown, 18 images · non-contrast
Comparison: CTs of the chest of 10/24/2020 and 11/07/2019.

CLINICAL DATA: Preop for solitary lung nodule.

EXAM:
CT CHEST WITHOUT CONTRAST
TECHNIQUE: Multidetector CT imaging of the chest was performed using thin slice
collimation for electromagnetic bronchoscopy planning purposes,
without intravenous contrast.

[Series 3: thins · axial · 0.73mm/px · z∈[-20,+300]mm · 12 of 516 slices shown, 15 images]
[im 29/516  mediastinal]
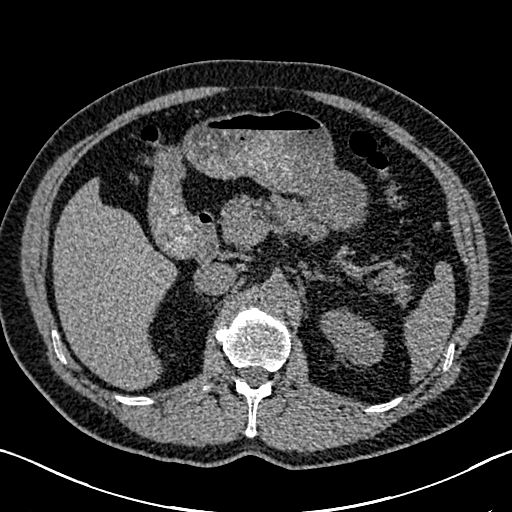
[im 29/516  lung]
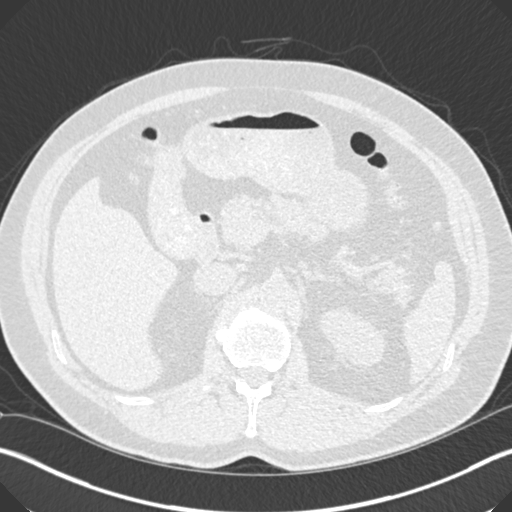
[im 86/516  lung]
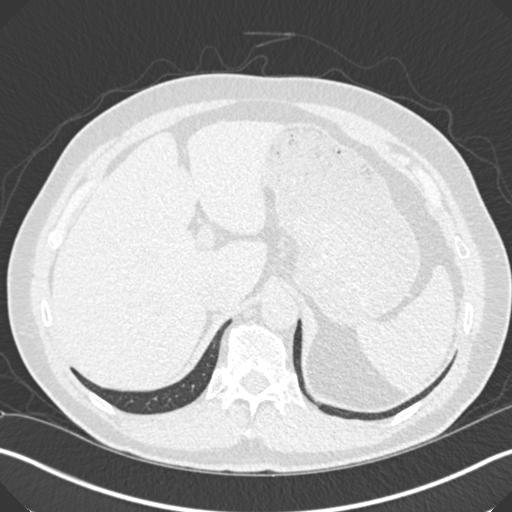
[im 115/516  lung]
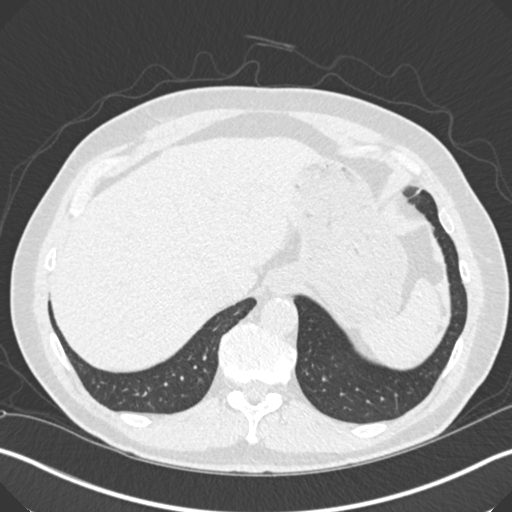
[im 144/516  lung]
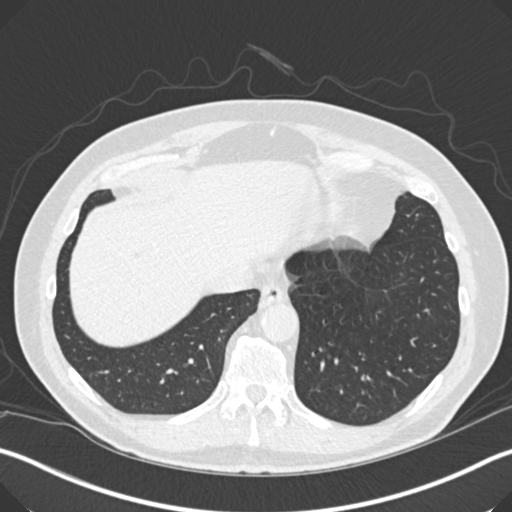
[im 201/516  mediastinal]
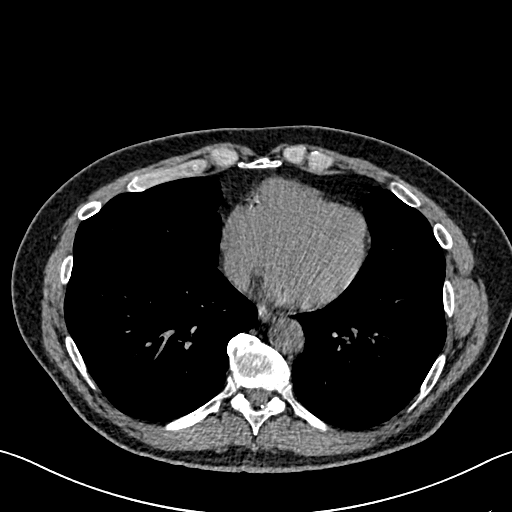
[im 201/516  lung]
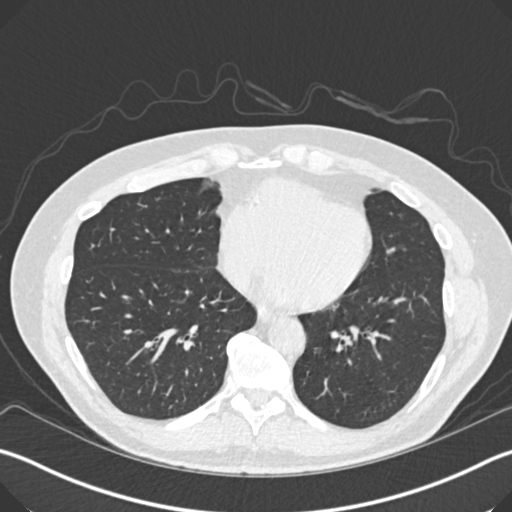
[im 229/516  lung]
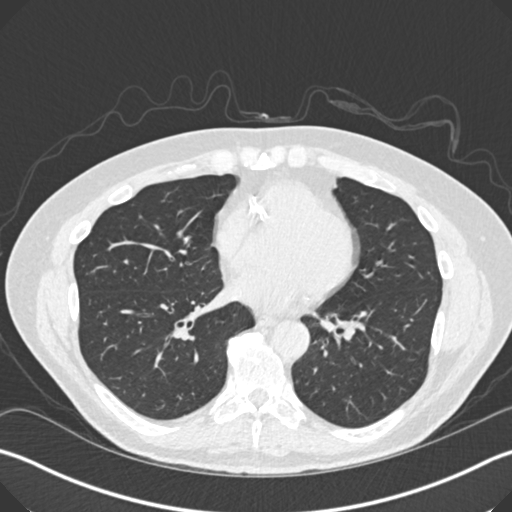
[im 287/516  lung]
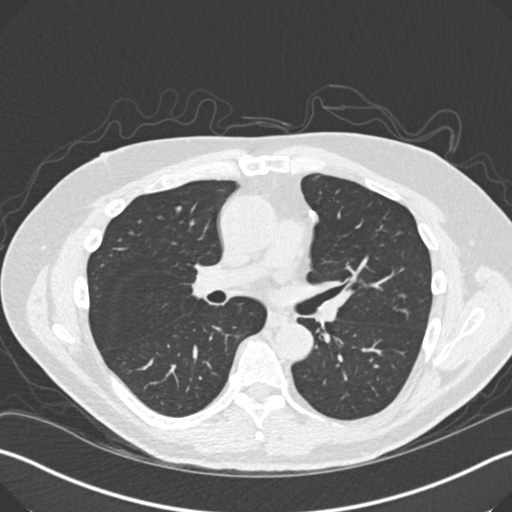
[im 315/516  lung]
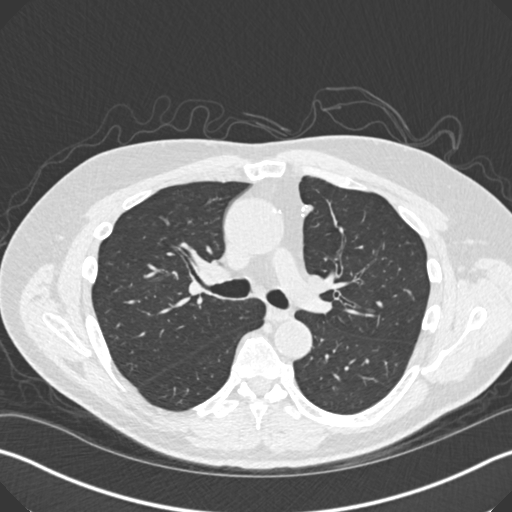
[im 372/516  mediastinal]
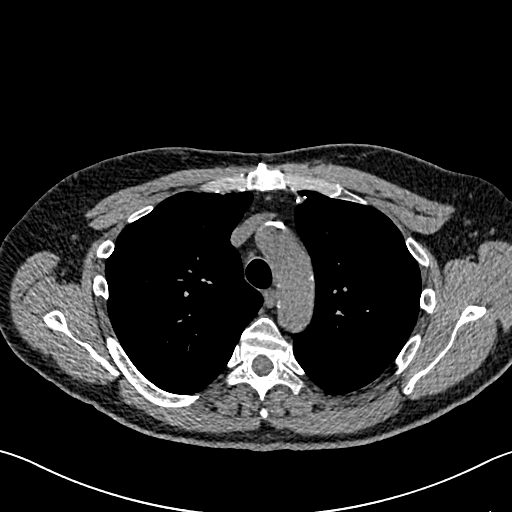
[im 372/516  lung]
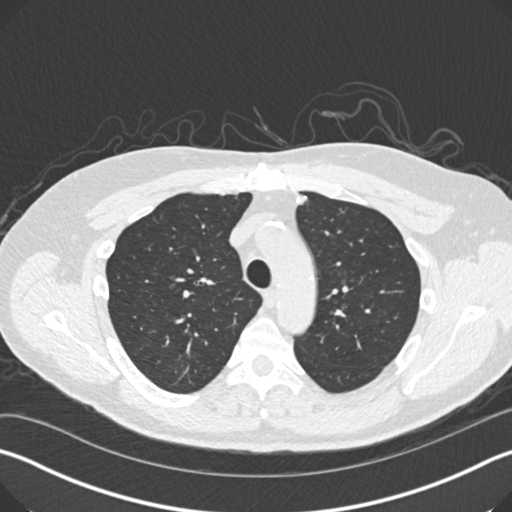
[im 401/516  lung]
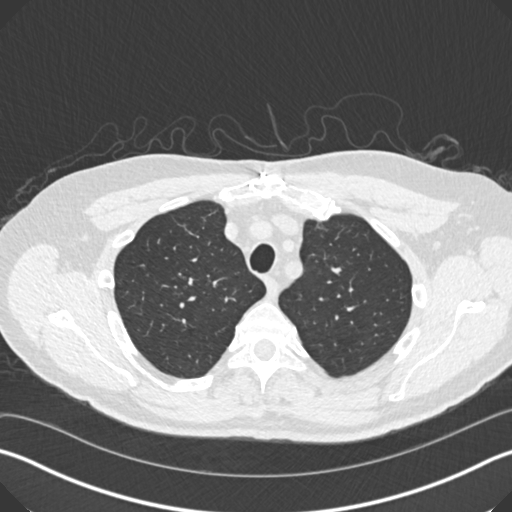
[im 430/516  lung]
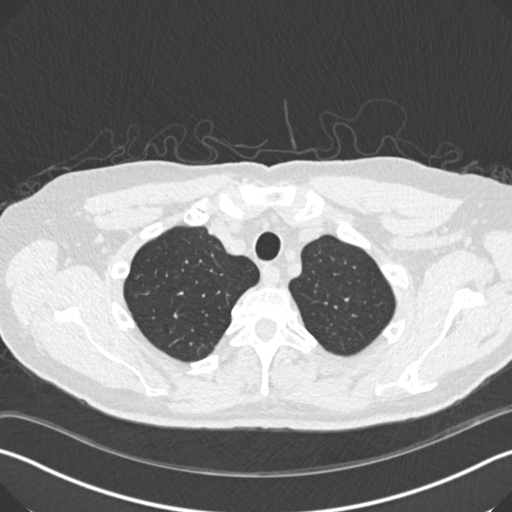
[im 487/516  lung]
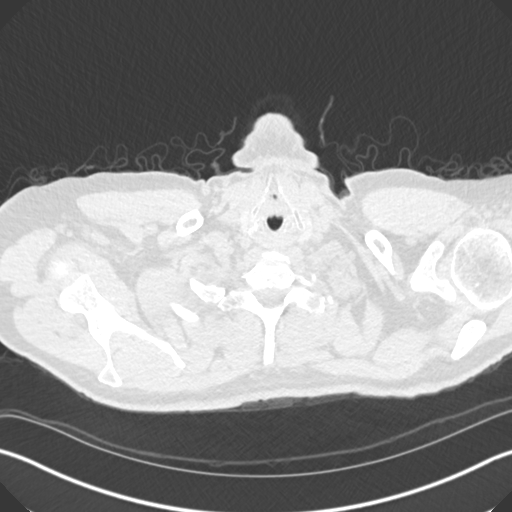

[Series 4: coronal · coronal · 0.71mm/px · 3 of 133 slices shown]
[im 27/133  lung]
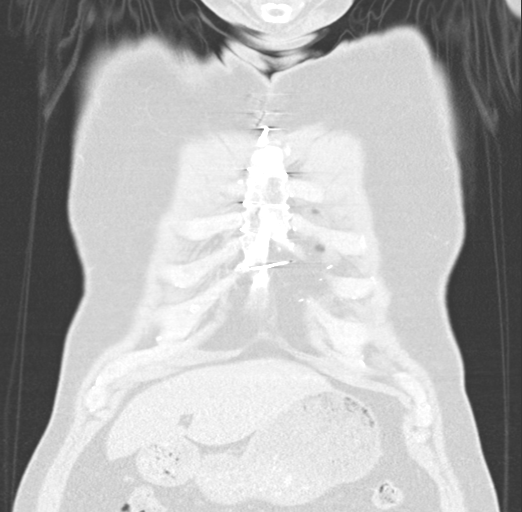
[im 53/133  lung]
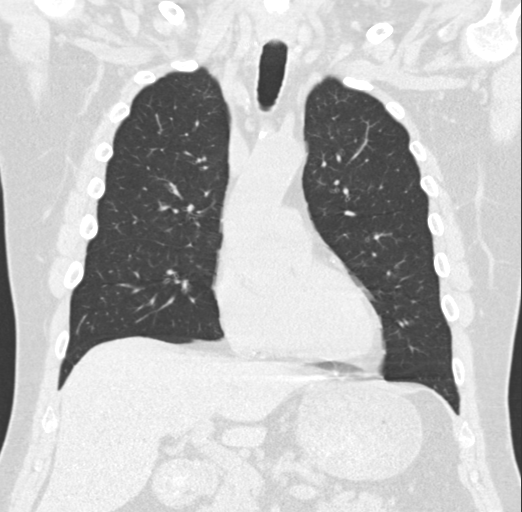
[im 80/133  lung]
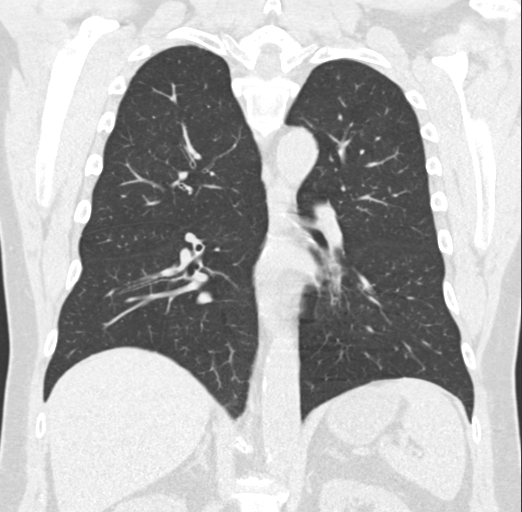

[15 of 36 positions shown; findings below may reference images not displayed]

FINDINGS: Cardiovascular: Aortic atherosclerosis. Normal heart size, without
pericardial effusion. Median sternotomy for prior CABG.

Mediastinum/Nodes: No mediastinal or definite hilar adenopathy,
given limitations of unenhanced CT.

Lungs/Pleura: No pleural fluid. Mild centrilobular emphysema. Right
upper lobe pulmonary nodule measures 2.1 x 1.7 cm on 63/7. Felt to
of enlarged compared to 1.7 x 1.6 cm on the prior exam. 1.6 cm
craniocaudal today versus 1.5 cm on the prior (when remeasured).

Upper Abdomen: 3 mm hepatic dome low-density lesion is likely a
cyst. normal imaged portions of the stomach, spleen, pancreas,
adrenal glands, right kidney. 1.6 cm upper pole left renal
low-density lesion is likely a cyst. Abdominal aortic
atherosclerosis.

Musculoskeletal: Cervicothoracic junction fixation. Mid and lower
thoracic spondylosis.
IMPRESSION: 1. Right upper lobe lung nodule, felt to be minimally enlarged from
10/24/2020. This remains suspicious for primary bronchogenic
carcinoma.
2. No thoracic adenopathy.
3. Aortic atherosclerosis ([id number].J) and emphysema ([id number].I).

## 2022-10-21 IMAGING — CR DG CHEST 2V
2 series · 2 of 2 positions shown · non-contrast
Comparison: 12/12/2020 CT.

CLINICAL DATA: Preop for robot assisted thoracoscopy. Hypertension.

EXAM:
CHEST - 2 VIEW

[w chest pa]
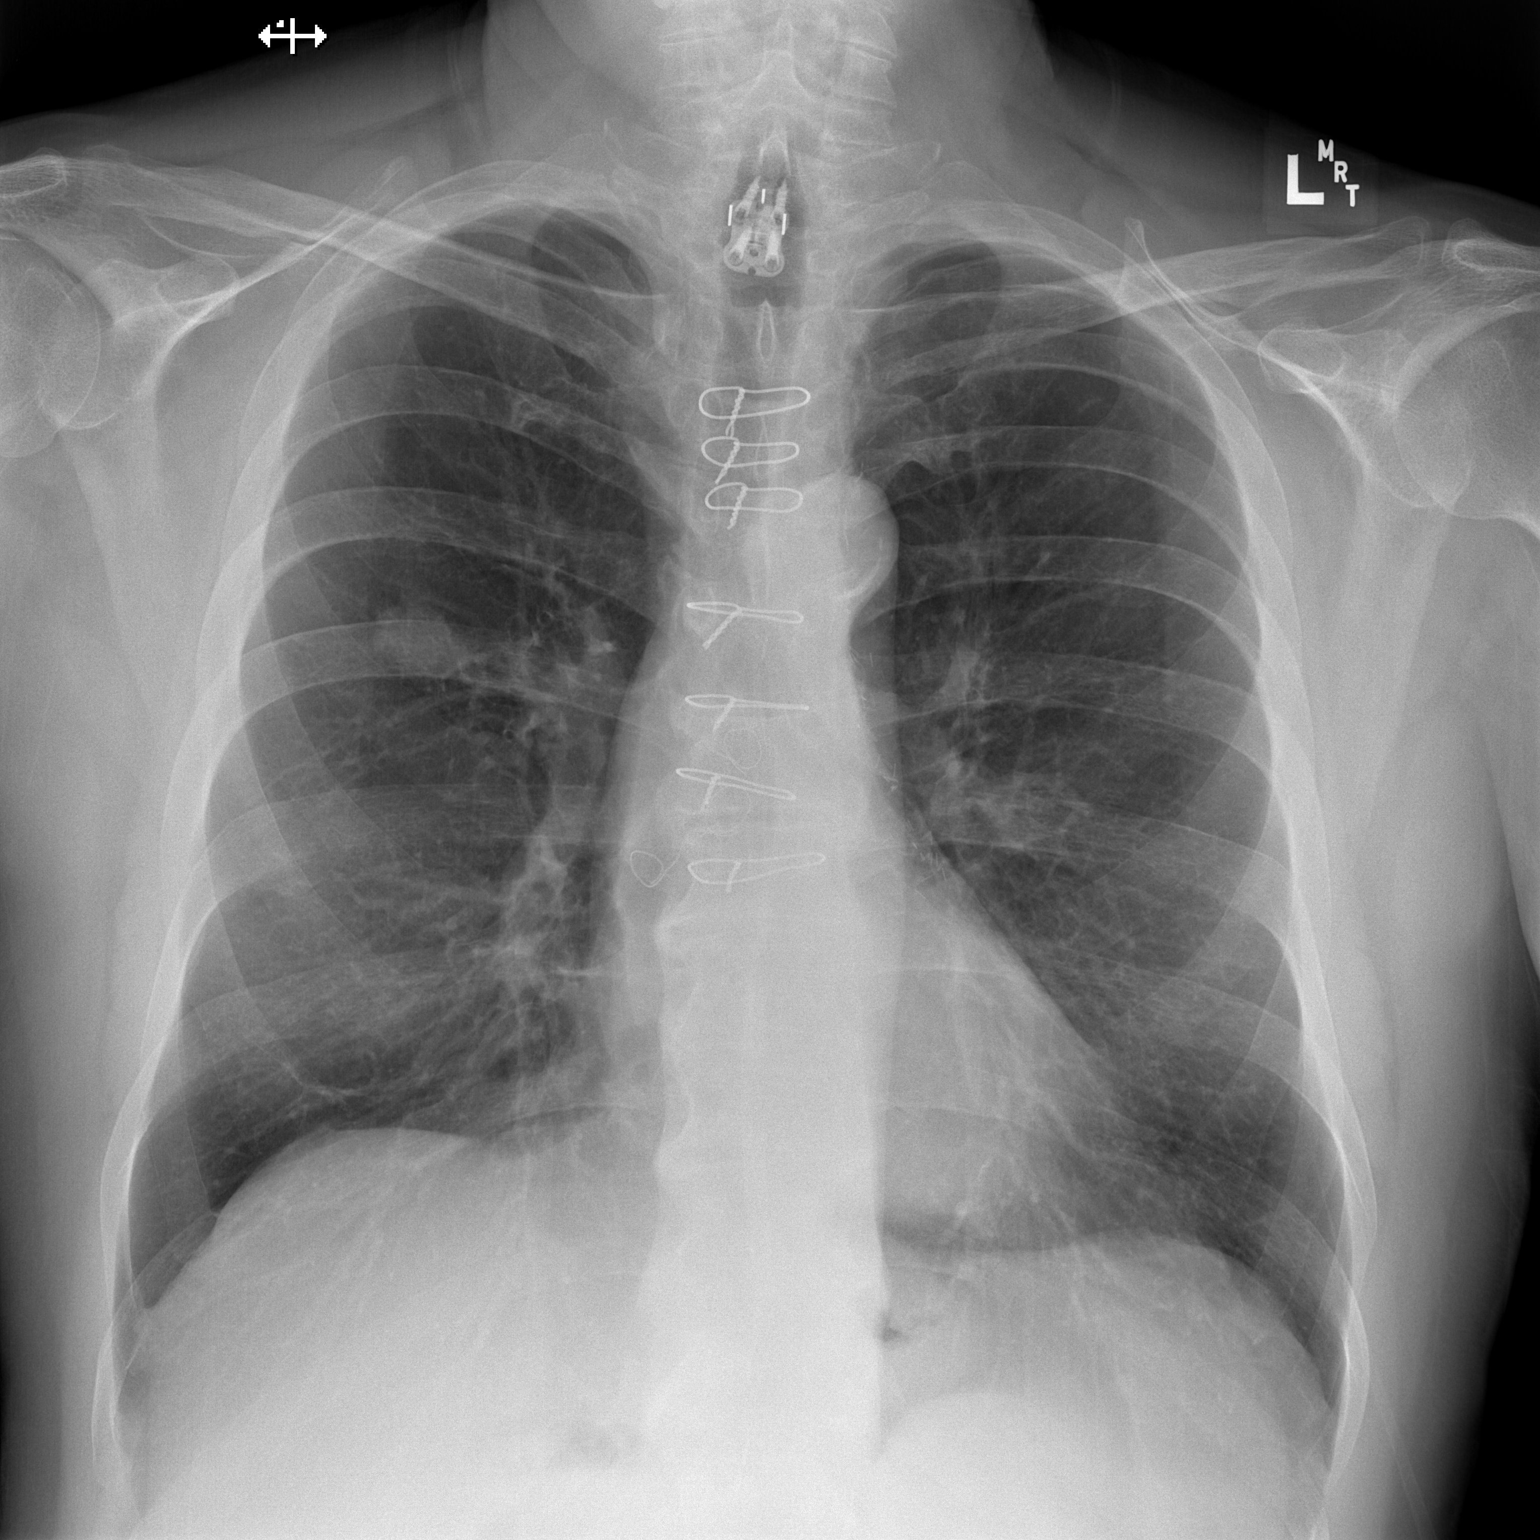

[w chest lat]
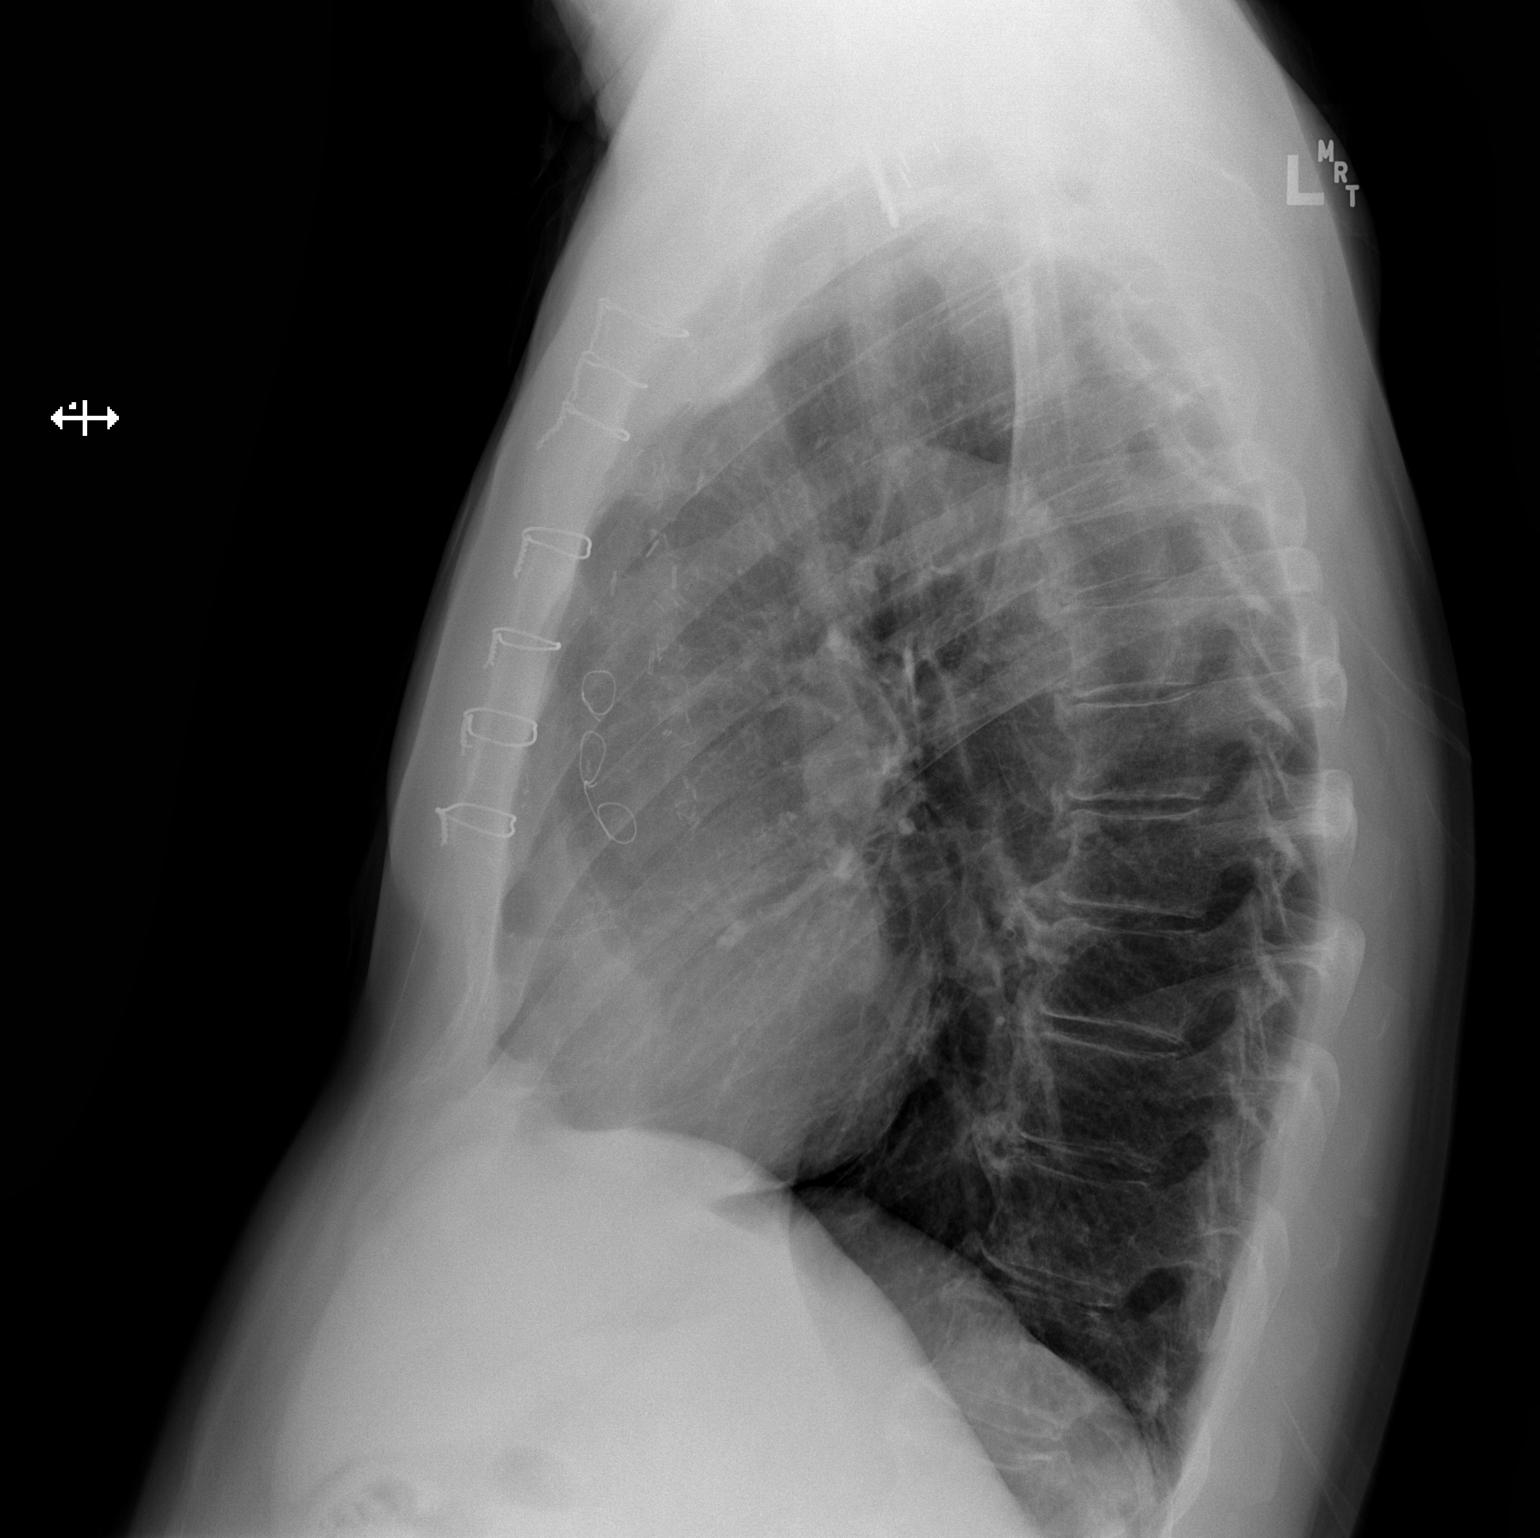

[2 of 2 positions shown; findings below may reference images not displayed]

FINDINGS: Prior median sternotomy.  Cervical spine fixation.

Midline trachea. Normal heart size. Atherosclerosis in the
transverse aorta. No pleural effusion or pneumothorax. Right upper
lobe 2.2 cm pulmonary nodule again identified. No lobar
consolidation or other acute superimposed process.
IMPRESSION: Right upper lobe pulmonary nodule, as before.

Aortic Atherosclerosis ([id number].M).

## 2022-10-23 IMAGING — DX DG CHEST 1V PORT
1 series · 1 of 1 positions shown · non-contrast
Comparison: 12/22/2020.

CLINICAL DATA: Chest tube.

EXAM:
PORTABLE CHEST 1 VIEW

[chest]
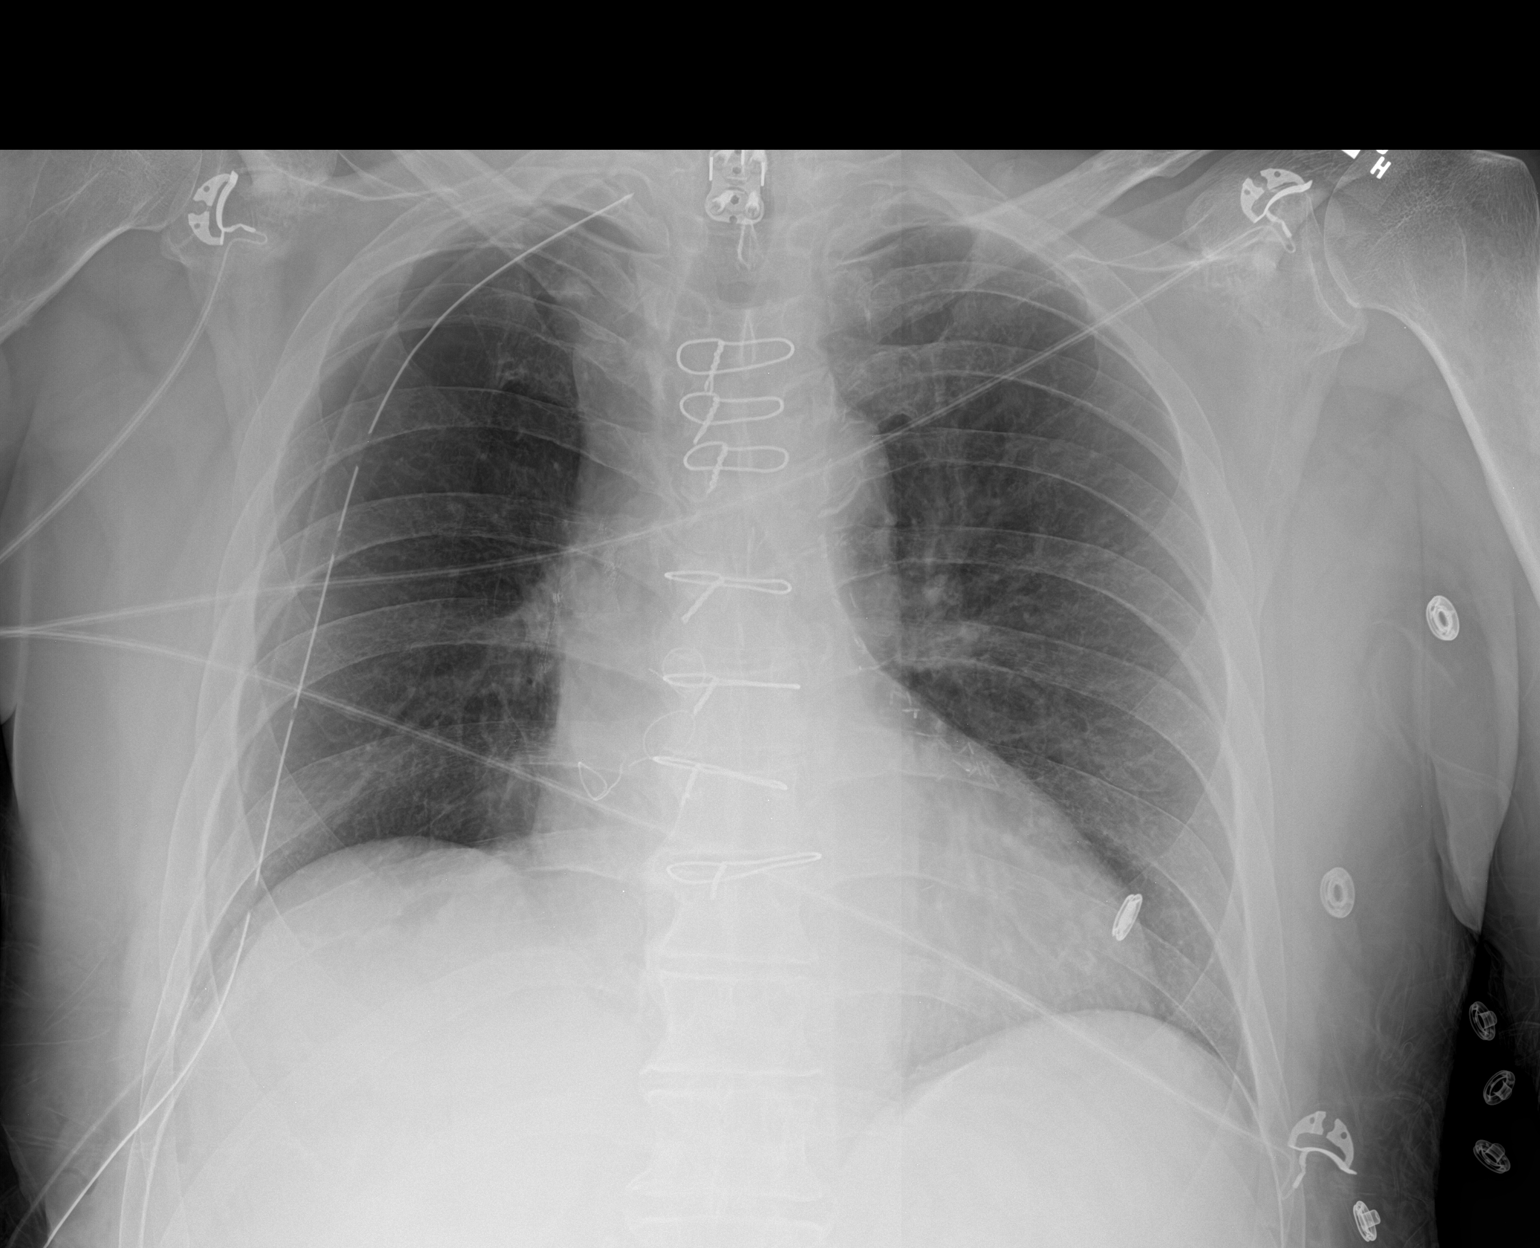

[1 of 1 positions shown; findings below may reference images not displayed]

FINDINGS: Right chest tube noted with tip in good anatomic position. No
pneumothorax. Postsurgical changes right lung. Previously identified
mass in the right lung no longer visualized. Prior CABG. Heart size
normal. Prior cervical spine fusion.
IMPRESSION: 1. Right chest tube noted with tip in good anatomic position. No
pneumothorax. Postsurgical changes right lung. Previously identified
right pulmonary mass no longer visualized.

2.  Prior CABG.  Heart size normal.

## 2022-10-24 IMAGING — DX DG CHEST 1V PORT
1 series · 1 of 1 positions shown · non-contrast
Comparison: 12/24/2020.

CLINICAL DATA: History of pneumothorax.

EXAM:
PORTABLE CHEST 1 VIEW

[chest ap]
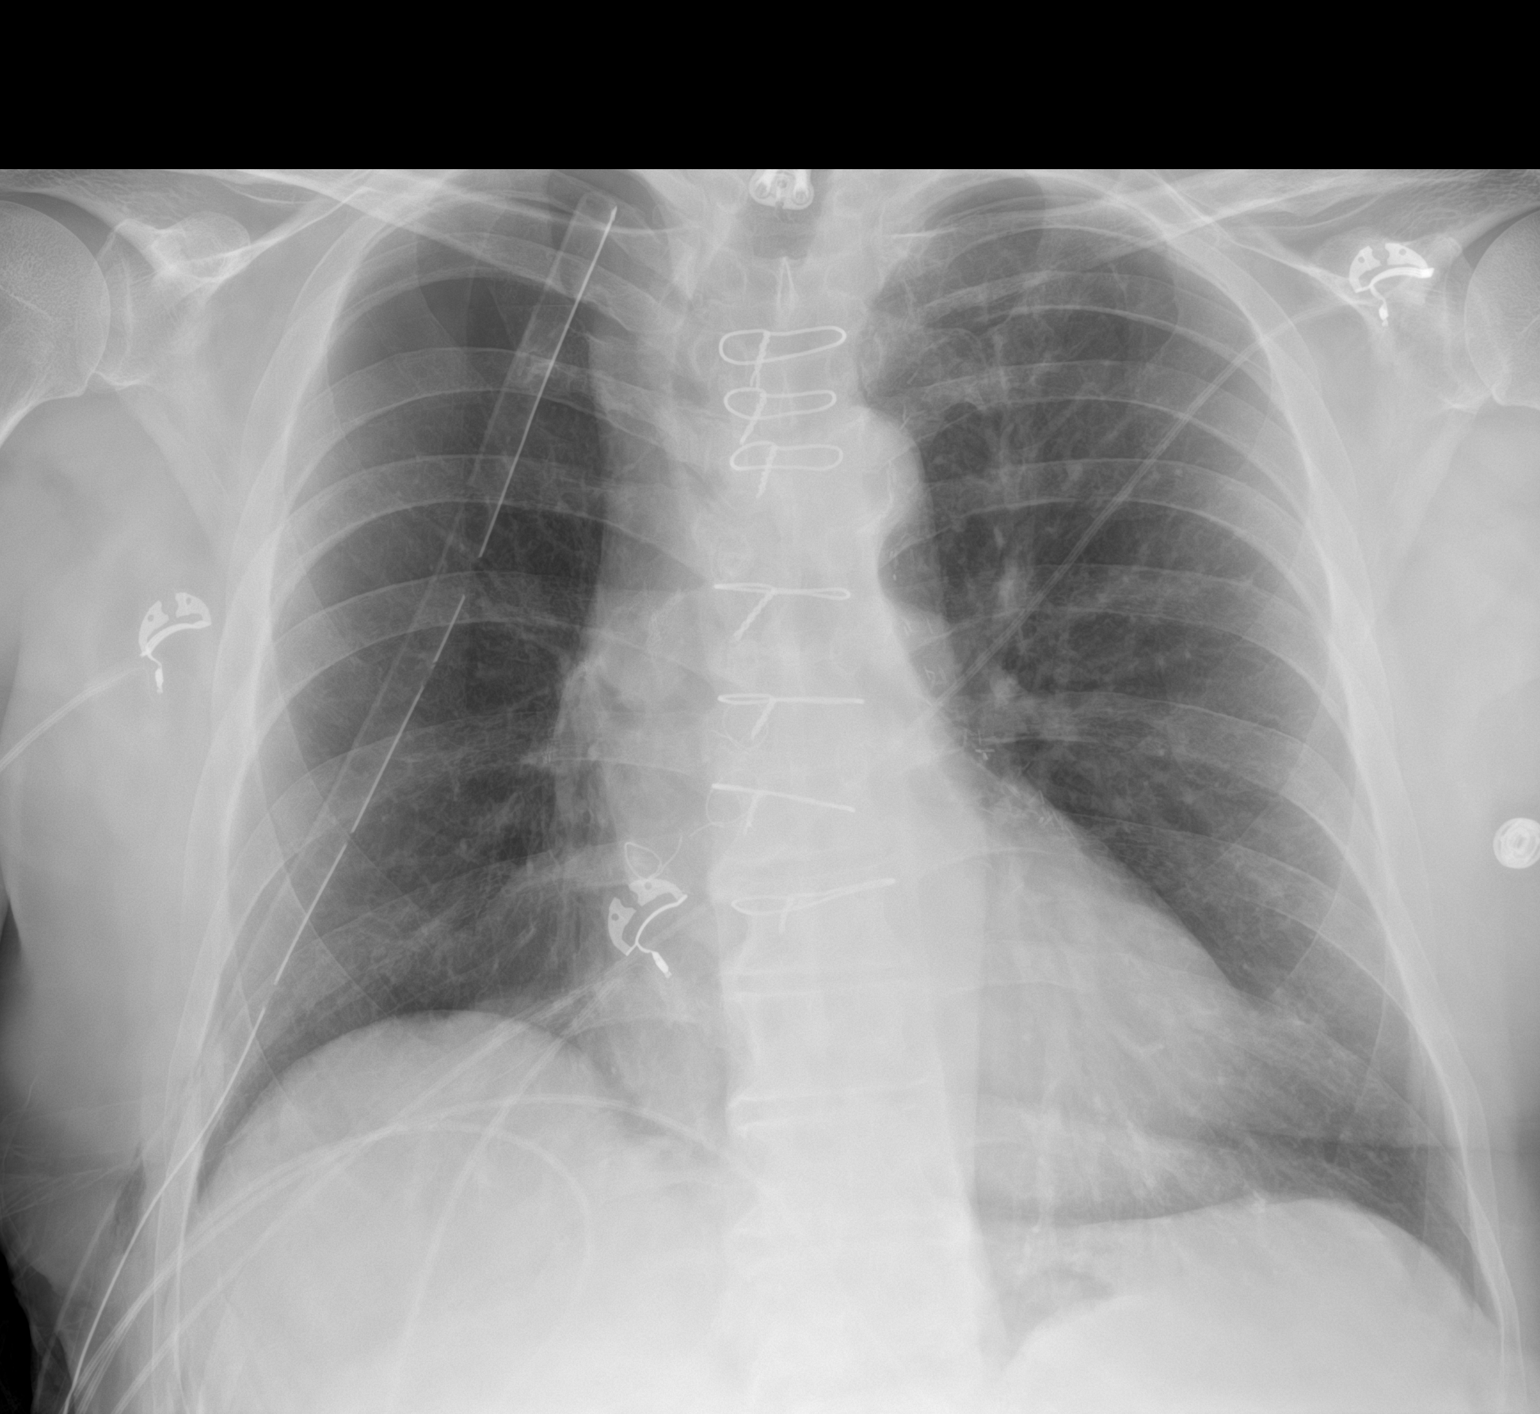

[1 of 1 positions shown; findings below may reference images not displayed]

FINDINGS: Right chest tube again noted in good anatomic position. No
pneumothorax. Postsurgical changes right lung again noted. No focal
infiltrate. No pleural effusion. Prior CABG. Heart size stable.
Prior cervical spine fusion.
IMPRESSION: Stable chest from prior exam. Right chest tube in stable position.
No pneumothorax. Postsurgical changes again noted of the right lung.

## 2022-10-25 IMAGING — DX DG CHEST 1V PORT
1 series · 1 of 1 positions shown · non-contrast
Comparison: December 26, 2020 study obtained earlier in the day.

CLINICAL DATA: Chest tube in place for pneumothorax

EXAM:
PORTABLE CHEST 1 VIEW

[chest]
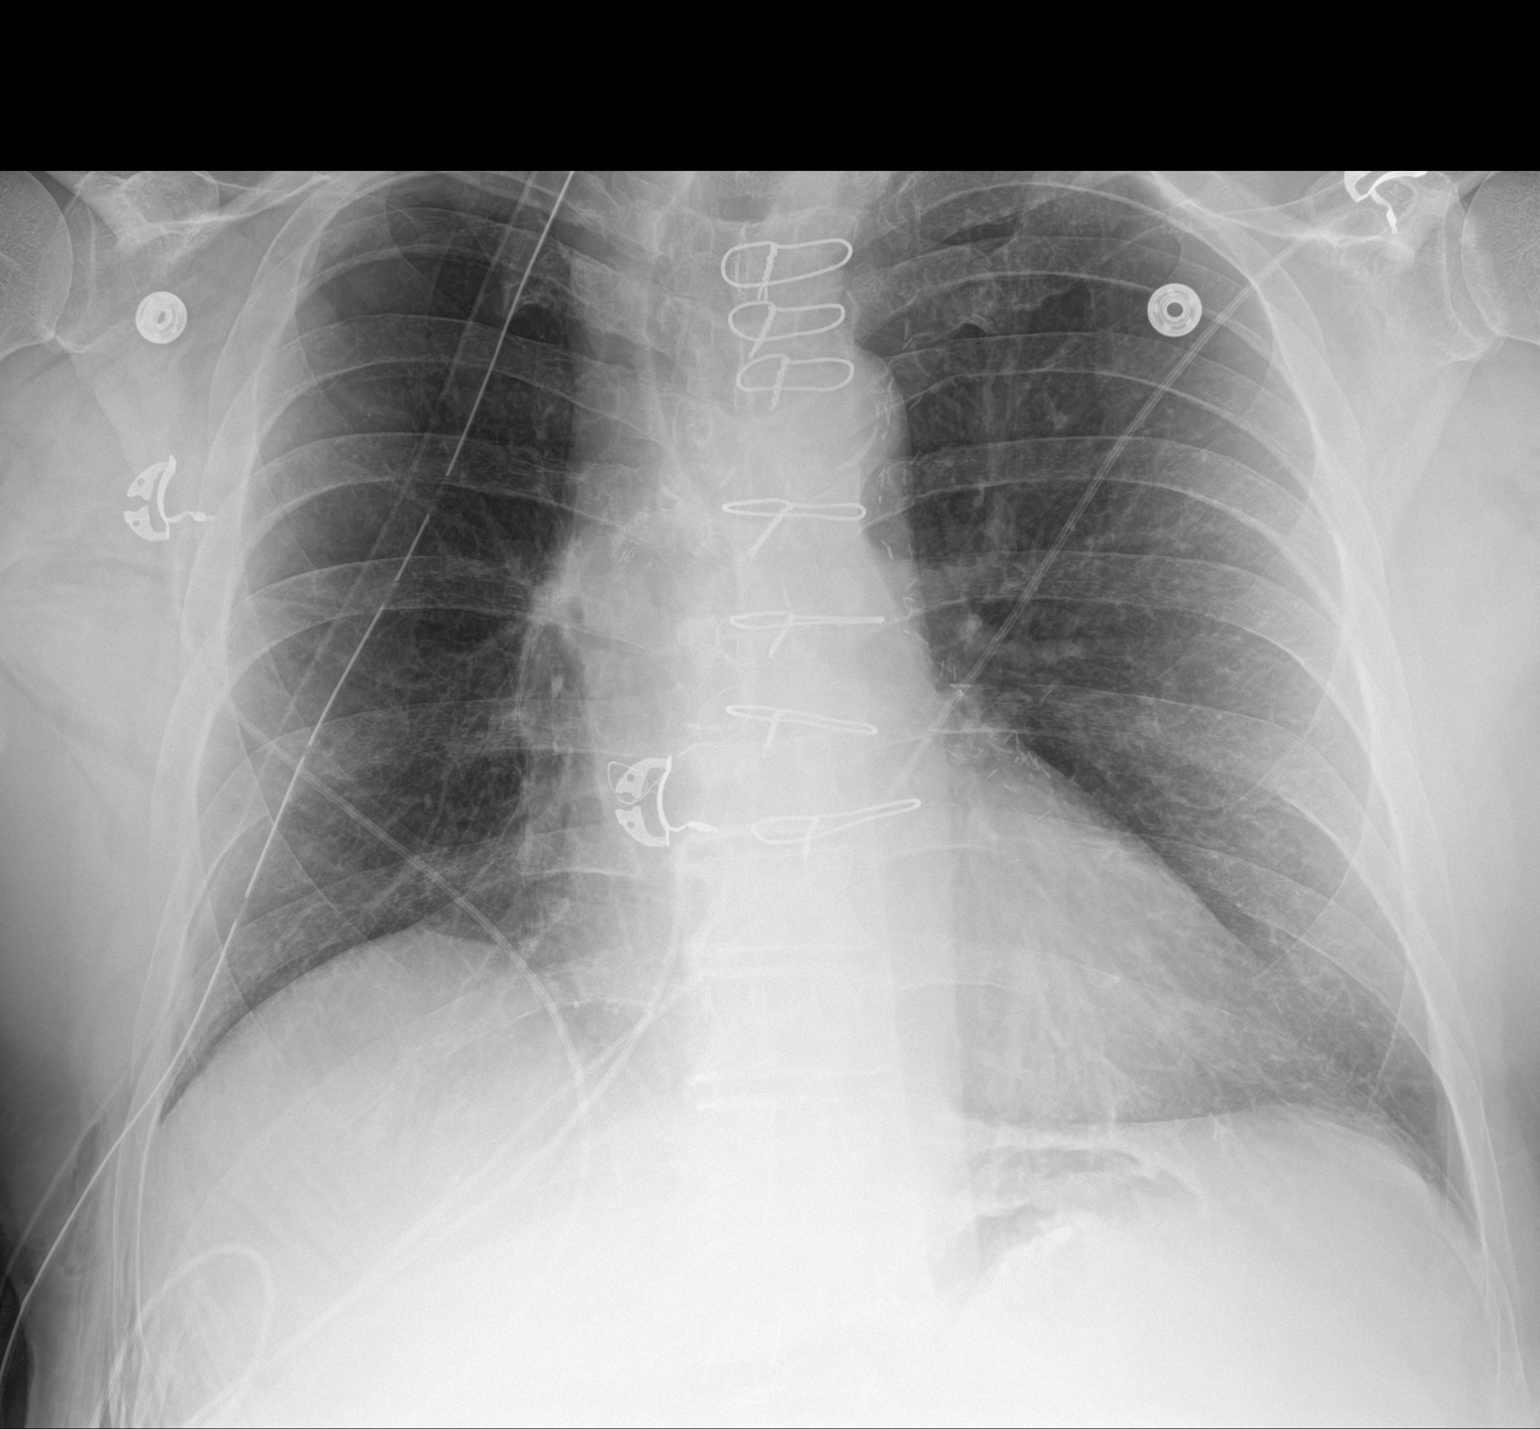

[1 of 1 positions shown; findings below may reference images not displayed]

FINDINGS: Chest tube position unchanged. No evident pneumothorax. There is
slight left base atelectasis. Lungs elsewhere are clear. Heart size
and pulmonary vascularity are normal. Patient is status post
internal mammary bypass grafting. There is aortic atherosclerosis.
No adenopathy. There is postoperative change in the lower cervical
region.
IMPRESSION: Chest tube on the right, unchanged. No pneumothorax. Mild left base
atelectasis. Lungs otherwise clear. Stable cardiac silhouette.
Aortic Atherosclerosis ([id number]).

## 2022-10-25 IMAGING — DX DG CHEST 1V PORT
1 series · 1 of 1 positions shown · non-contrast
Comparison: December 25, 2020

CLINICAL DATA: Chest tube in place for recent pneumothorax

EXAM:
PORTABLE CHEST 1 VIEW

[chest ap]
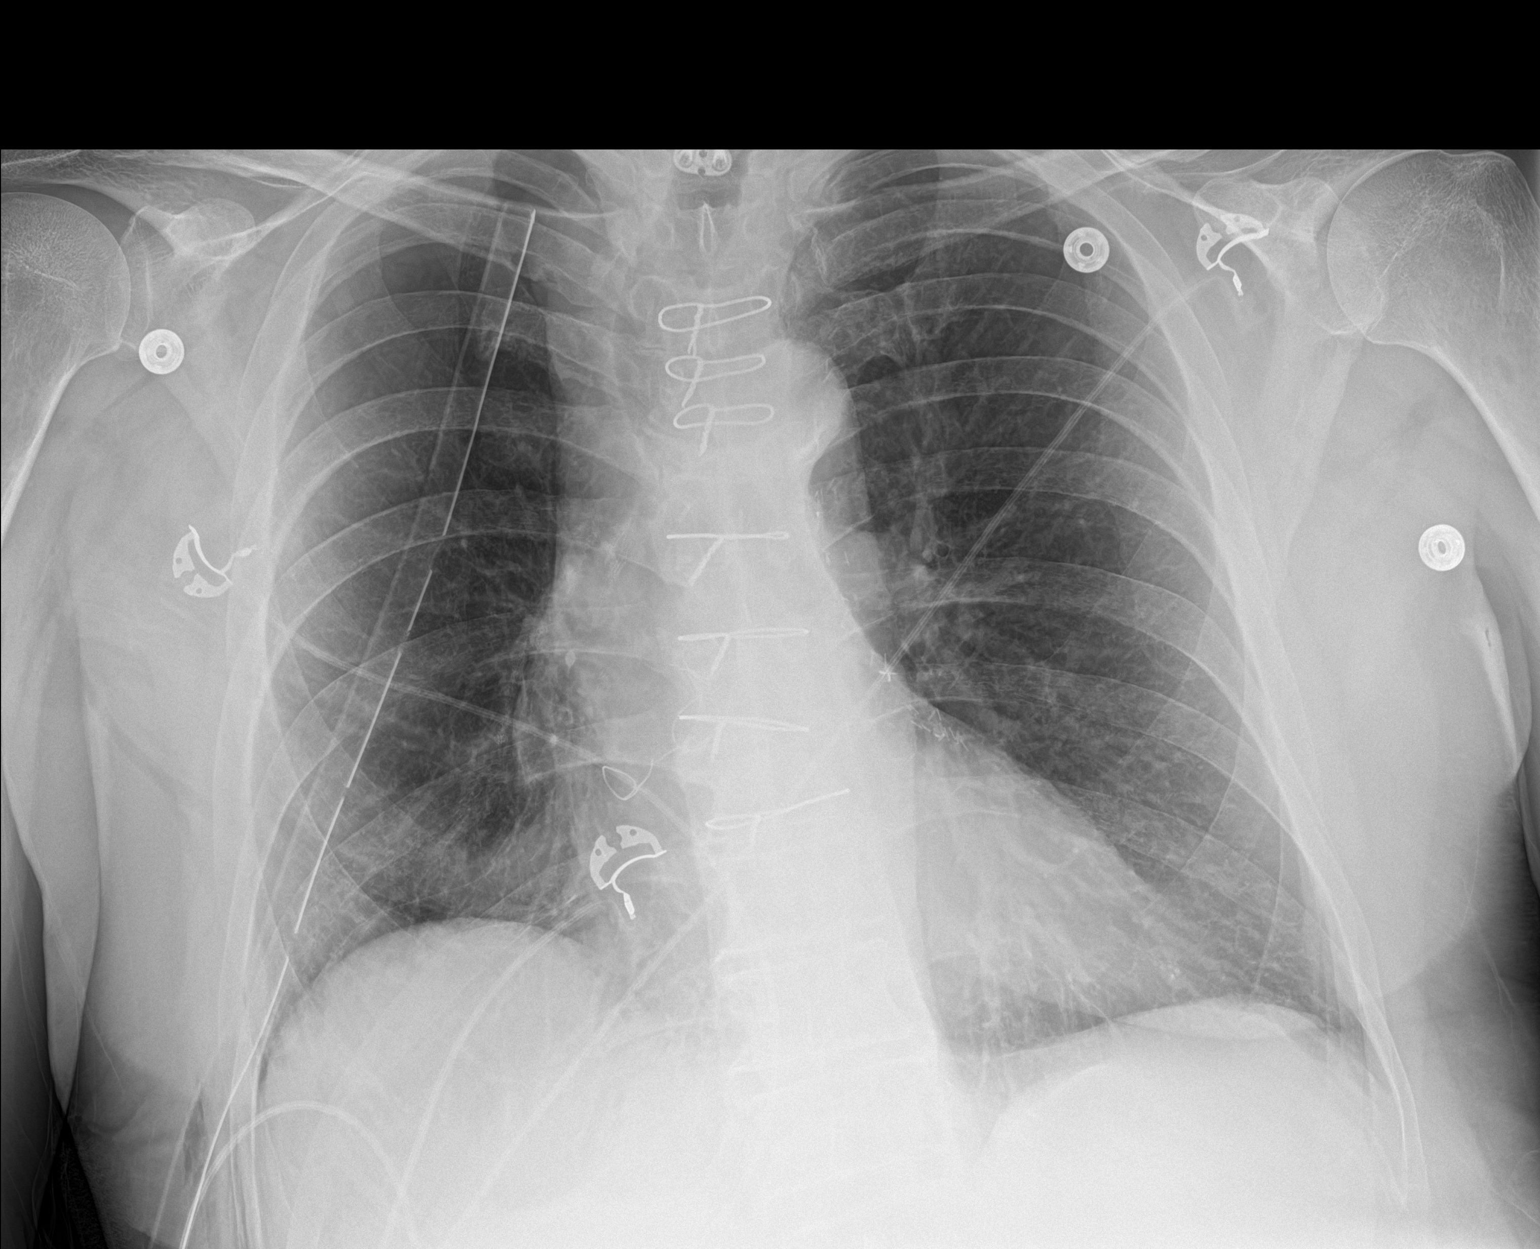

[1 of 1 positions shown; findings below may reference images not displayed]

FINDINGS: Chest tube on the right unchanged in position. No demonstrable
pneumothorax. There is mild bibasilar atelectasis. No edema or
airspace opacity. Heart size and pulmonary vascular normal. Status
post coronary artery bypass grafting. Postoperative change at
cervicothoracic junction.
IMPRESSION: Stable chest tube positioning on the right without evident
pneumothorax. Bibasilar atelectasis. No edema or airspace opacity.
Stable cardiac silhouette.

## 2022-10-26 IMAGING — DX DG CHEST 1V PORT
1 series · 1 of 1 positions shown · non-contrast
Comparison: 12/26/2020

CLINICAL DATA: Right upper lobectomy

EXAM:
PORTABLE CHEST 1 VIEW

[chest]
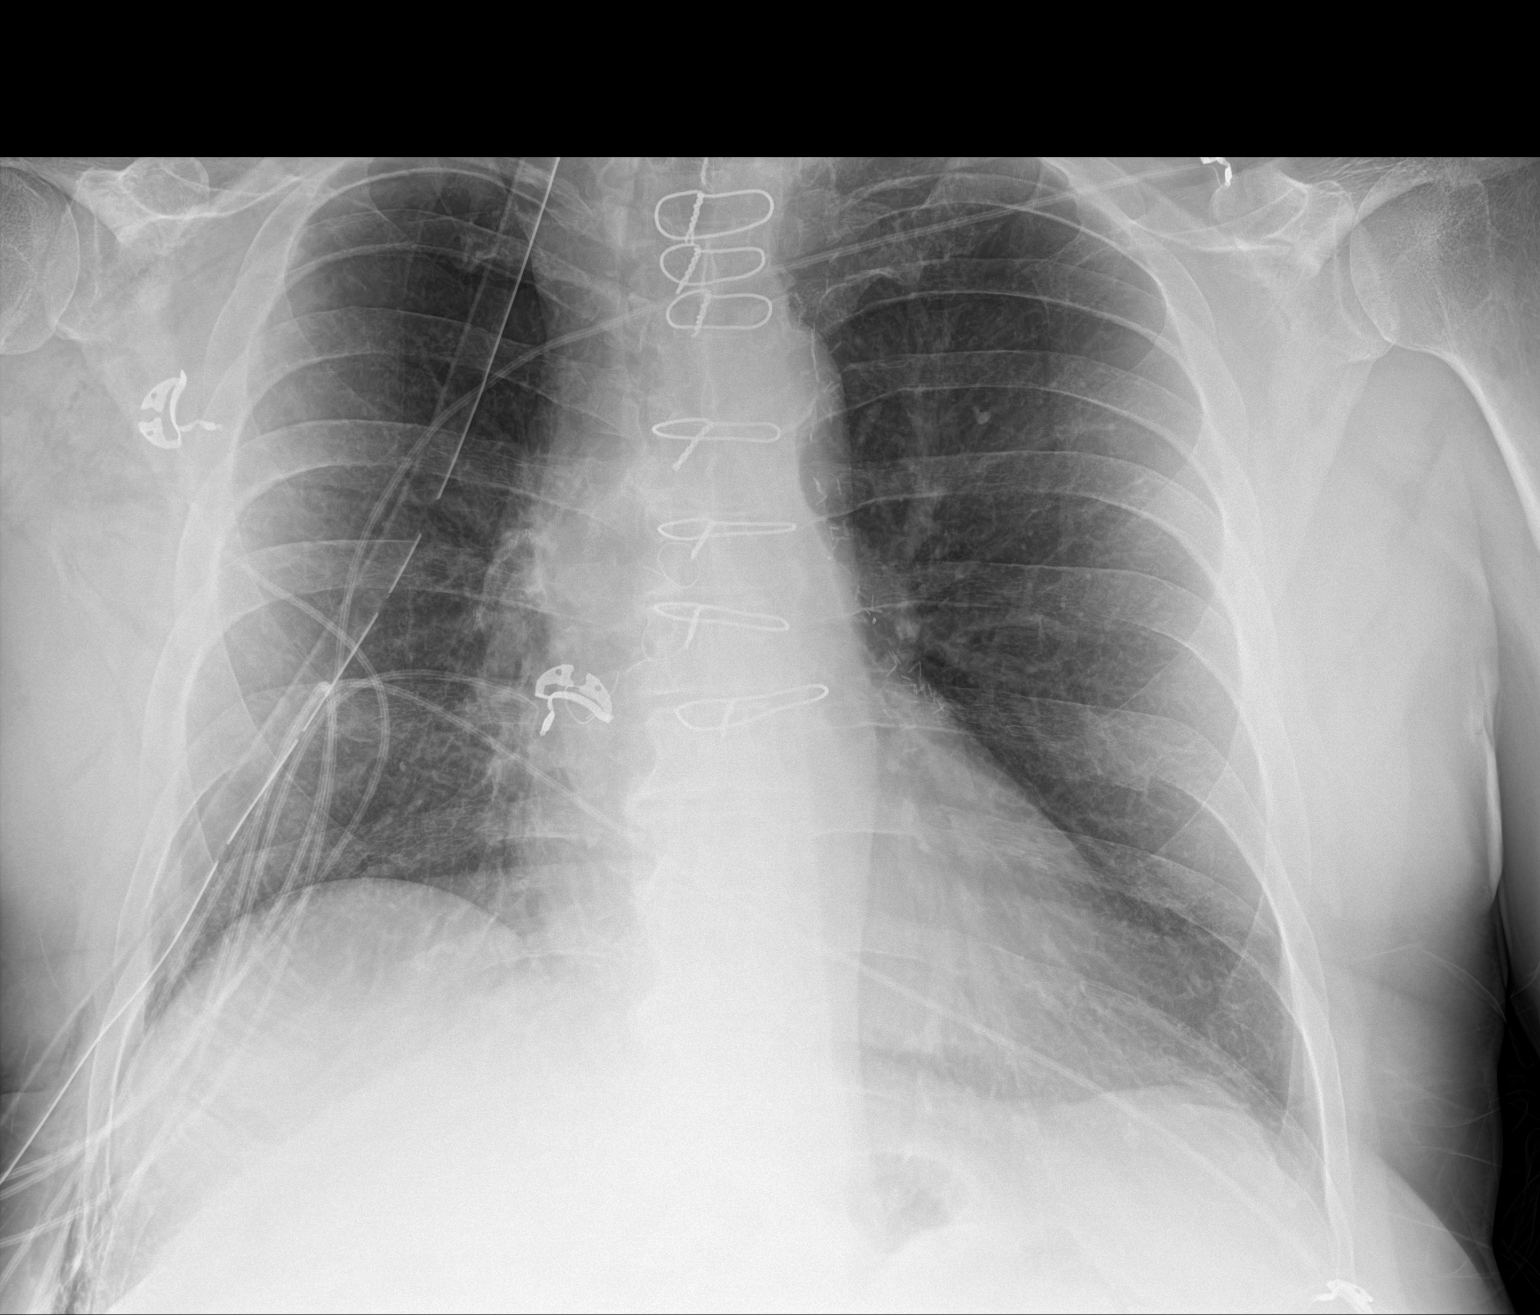

[1 of 1 positions shown; findings below may reference images not displayed]

FINDINGS: Right apically directed chest tube. No definite pneumothorax. Stable
mild elevation of the right hemidiaphragm secondary to volume loss.
No new consolidation or edema. Stable cardiomediastinal contours.
Post CABG.
IMPRESSION: Right apical chest tube.  No pneumothorax.  Stable lung aeration.

## 2022-10-27 IMAGING — DX DG CHEST 1V PORT
1 series · 1 of 1 positions shown · non-contrast
Comparison: Multiple chest x-rays since December 22, 2020.

CLINICAL DATA: Right upper lobectomy.

EXAM:
PORTABLE CHEST 1 VIEW

[chest]
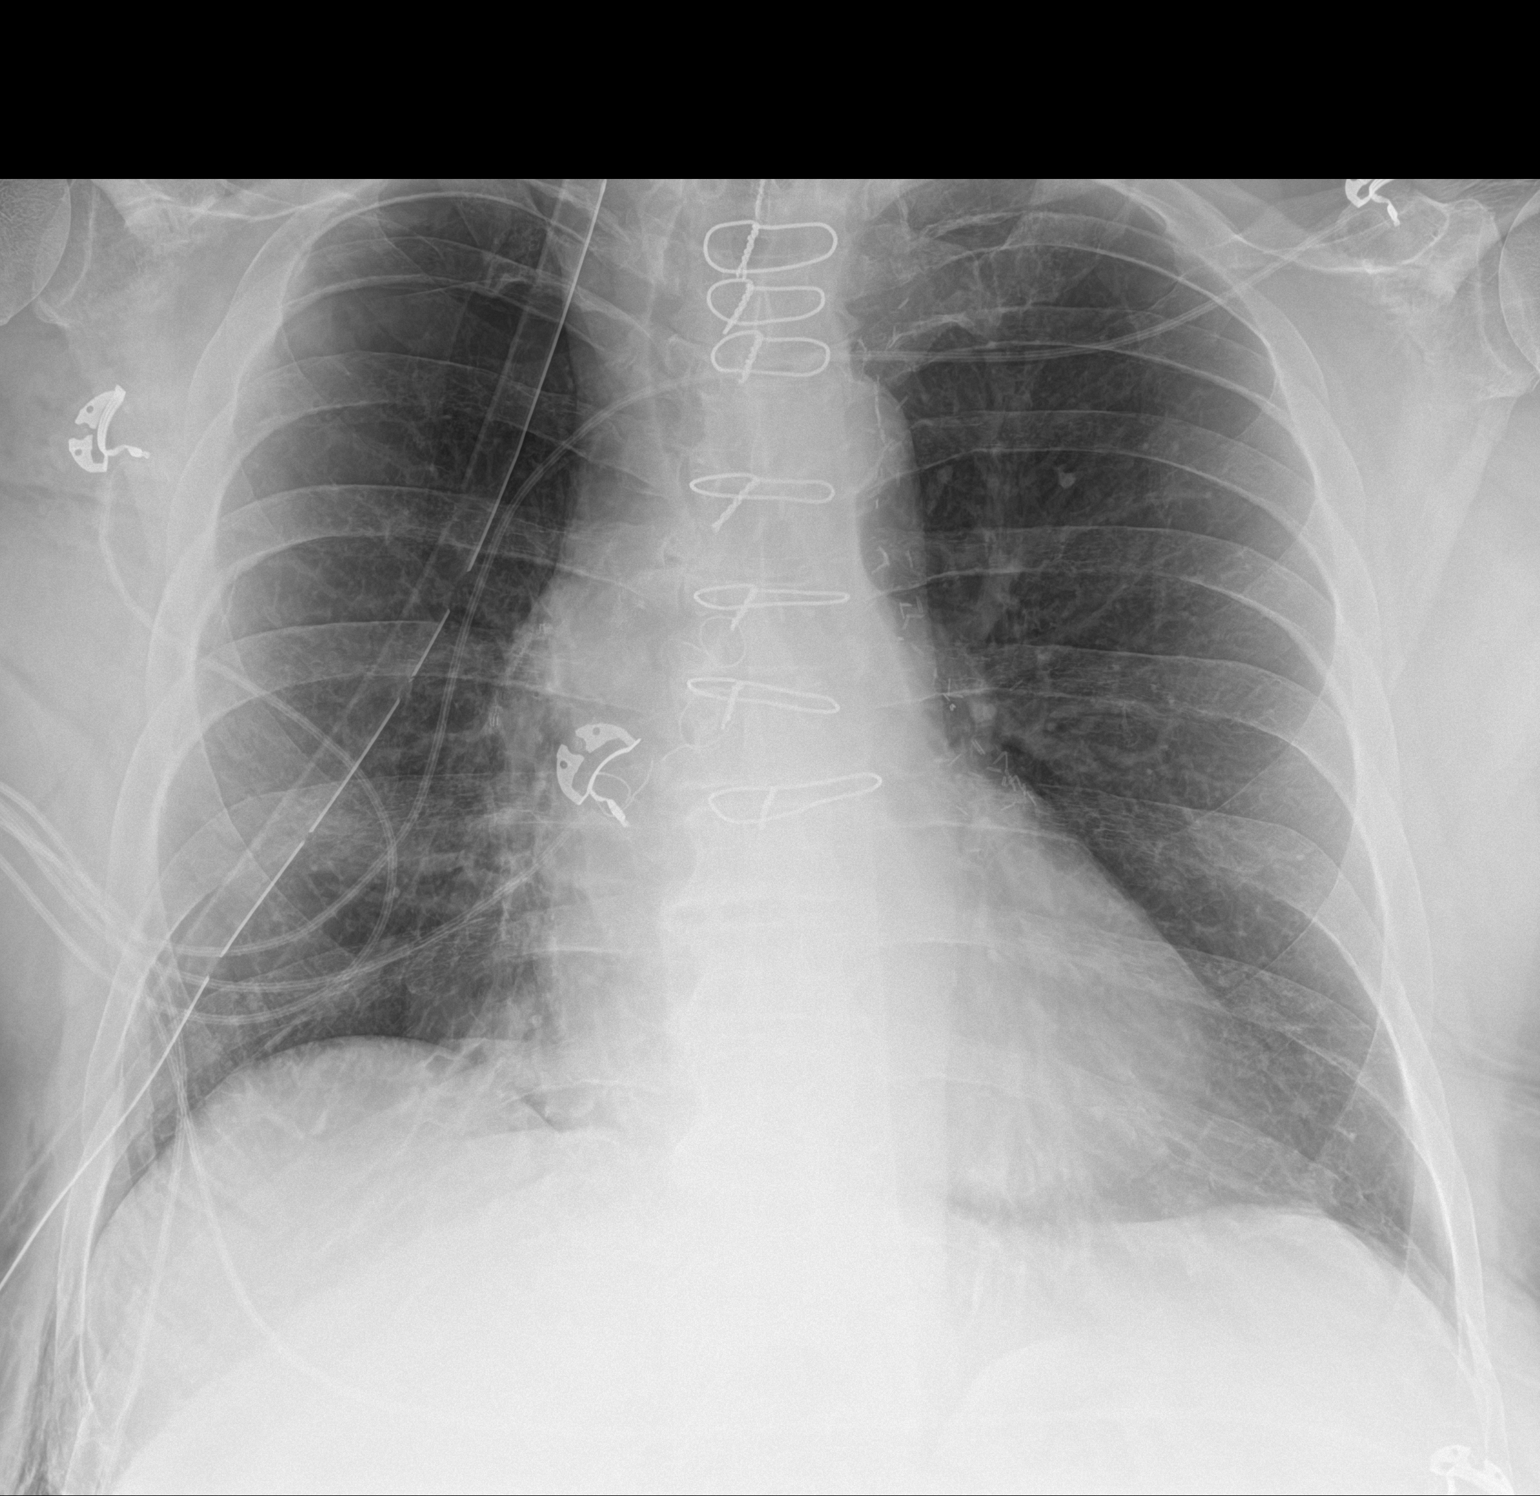

[1 of 1 positions shown; findings below may reference images not displayed]

FINDINGS: The right chest tube is stable. There may be a pleural stripe seen
at the right apex, possibly representing a pneumothorax. Lung
markings are seen on both sides however suggesting the pneumothorax
could either be anteriorly or posteriorly located. Recommend
continued attention on follow-up. CT imaging could better delineate
if clinically warranted.

Air in the right chest wall stable.

The heart, hila, and lungs are otherwise unchanged and unremarkable.
IMPRESSION: 1. A right chest tube remains in place.
2. There may be and anterior or posterior pneumothorax at the apex
with a possible pleural stripe. If this represents a pneumothorax,
it measures approximately 4.2 cm at the apex. This may have been
present yesterday in retrospect measuring 3 cm. Recommend attention
on follow-up. CT imaging could better delineate if clinically
warranted.

These results will be called to the ordering clinician or
representative by the Radiologist Assistant, and communication
documented in the PACS or [REDACTED].

## 2022-10-29 IMAGING — DX DG CHEST 1V PORT
1 series · 1 of 1 positions shown · non-contrast
Comparison: 12/28/2020.

CLINICAL DATA: Chest tube.  Pneumothorax.  Pulmonary nodule.

EXAM:
PORTABLE CHEST 1 VIEW

[chest ap]
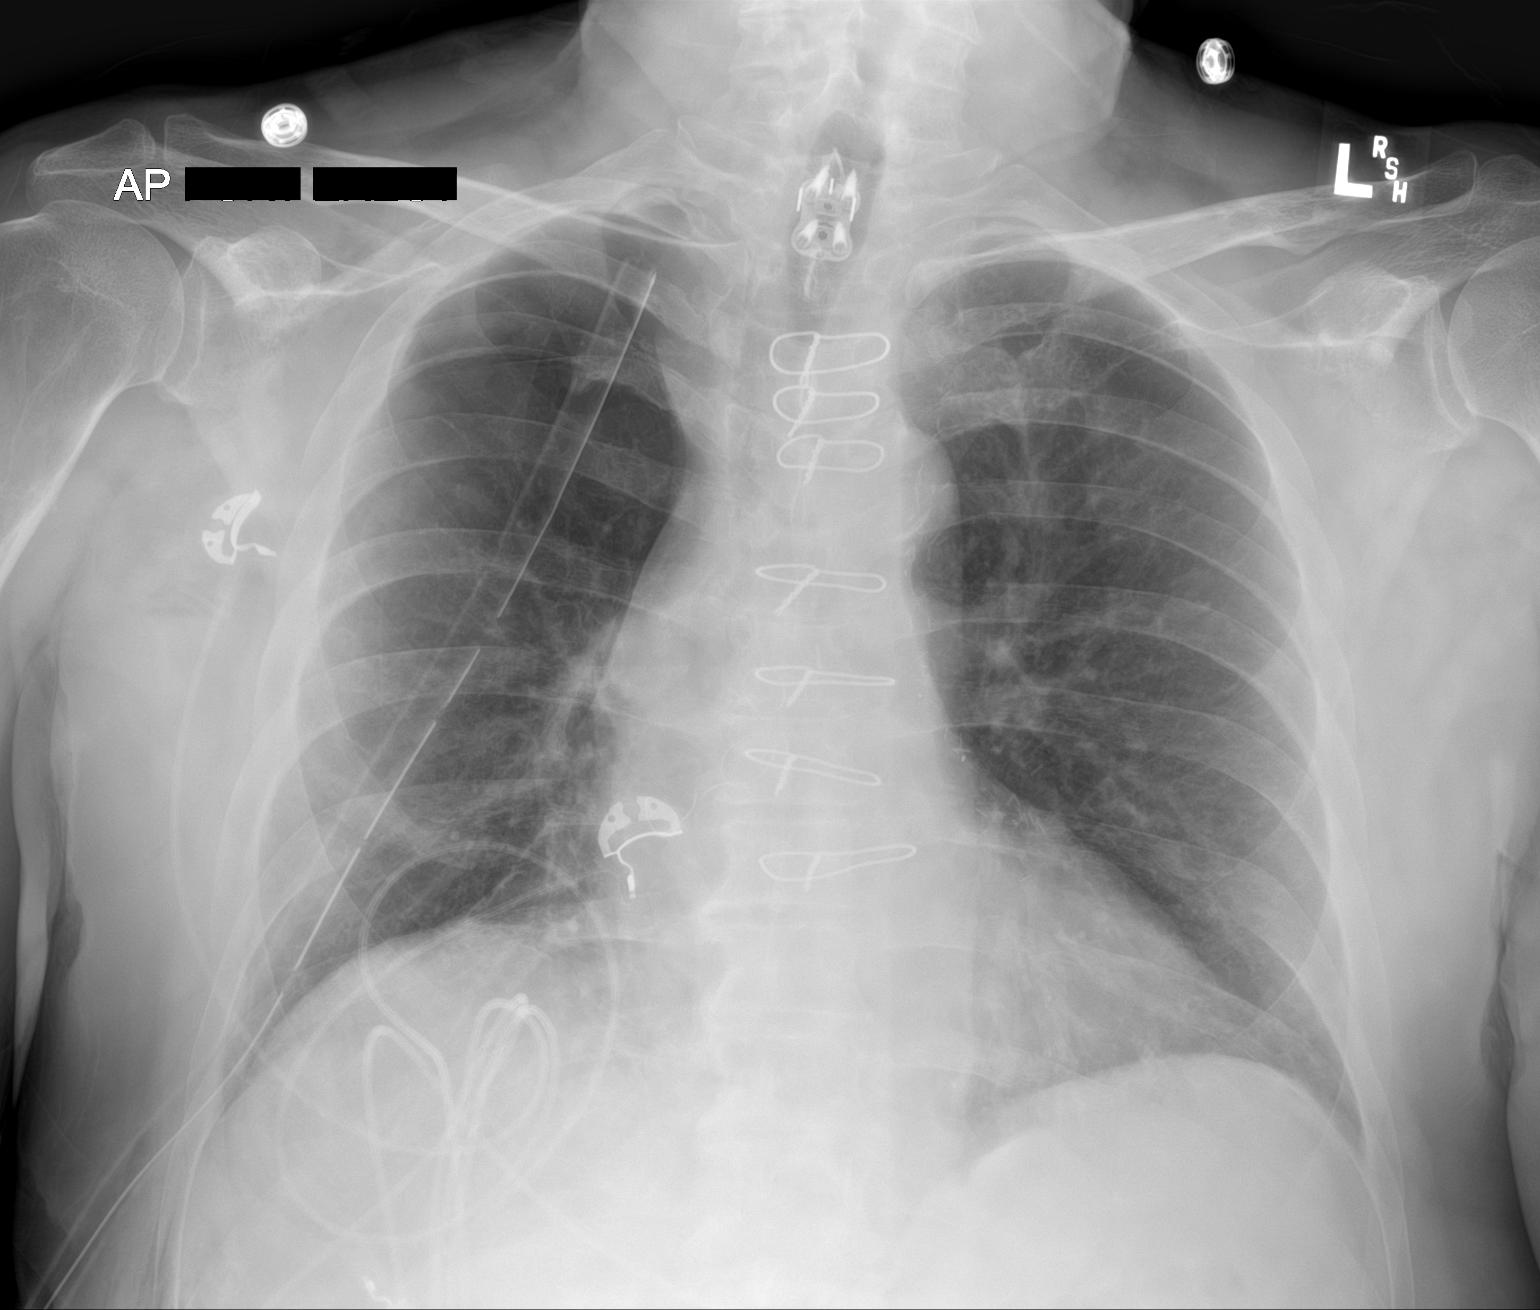

[1 of 1 positions shown; findings below may reference images not displayed]

FINDINGS: Right chest tube in stable position. Stable right apical
pneumothorax. Prior CABG. Heart size stable. Mild left base
subsegmental atelectasis. No pleural effusion or pneumothorax. Prior
cervicothoracic fusion.
IMPRESSION: 1. Right chest tube in stable position. Stable right apical
pneumothorax.

2.  Prior CABG.  Heart size stable.

3.  Mild right base subsegmental atelectasis.

## 2022-10-31 IMAGING — DX DG CHEST 1V PORT
1 series · 1 of 1 positions shown · non-contrast
Comparison: Film from earlier in the same day.

CLINICAL DATA: Follow-up right pneumothorax

EXAM:
PORTABLE CHEST 1 VIEW

[chest]
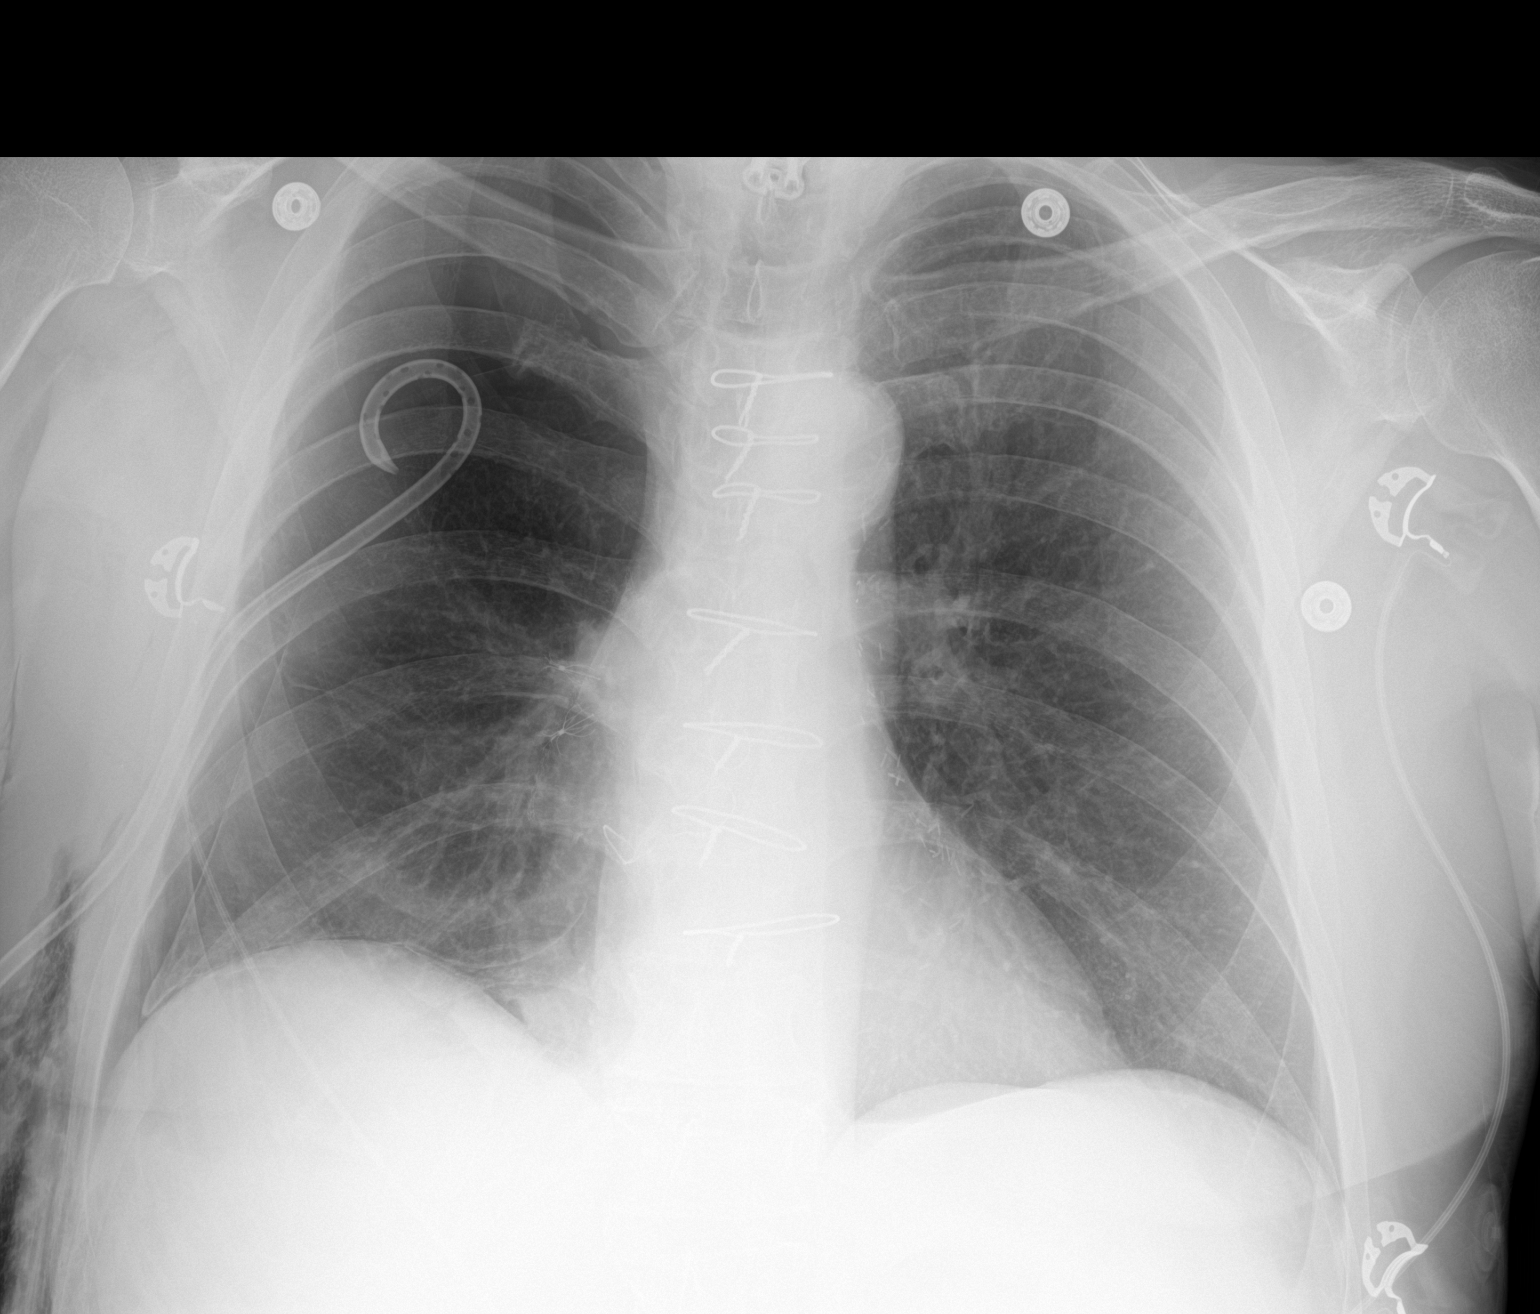

[1 of 1 positions shown; findings below may reference images not displayed]

FINDINGS: Cardiac shadows within normal limits. Postsurgical changes are seen.
Endobronchial valves are again noted on the right. Pigtail catheter
is seen with a right-sided pneumothorax which has increased in the
interval from the prior exam.
IMPRESSION: Interval increase in right-sided pneumothorax with approximately 7
cm of excursion from the apex increased from 4 cm on the previous
exam.

These results will be called to the ordering clinician or
representative by the Radiologist Assistant, and communication
documented in the PACS or [REDACTED].

## 2022-10-31 IMAGING — CR DG CHEST 2V
3 series · 3 of 3 positions shown · non-contrast
Comparison: December 31, 2020 and December 30, 2020

CLINICAL DATA: Chest pain.  Recent right upper lobectomy

EXAM:
CHEST - 2 VIEW

[chest lat (1 of 2)]
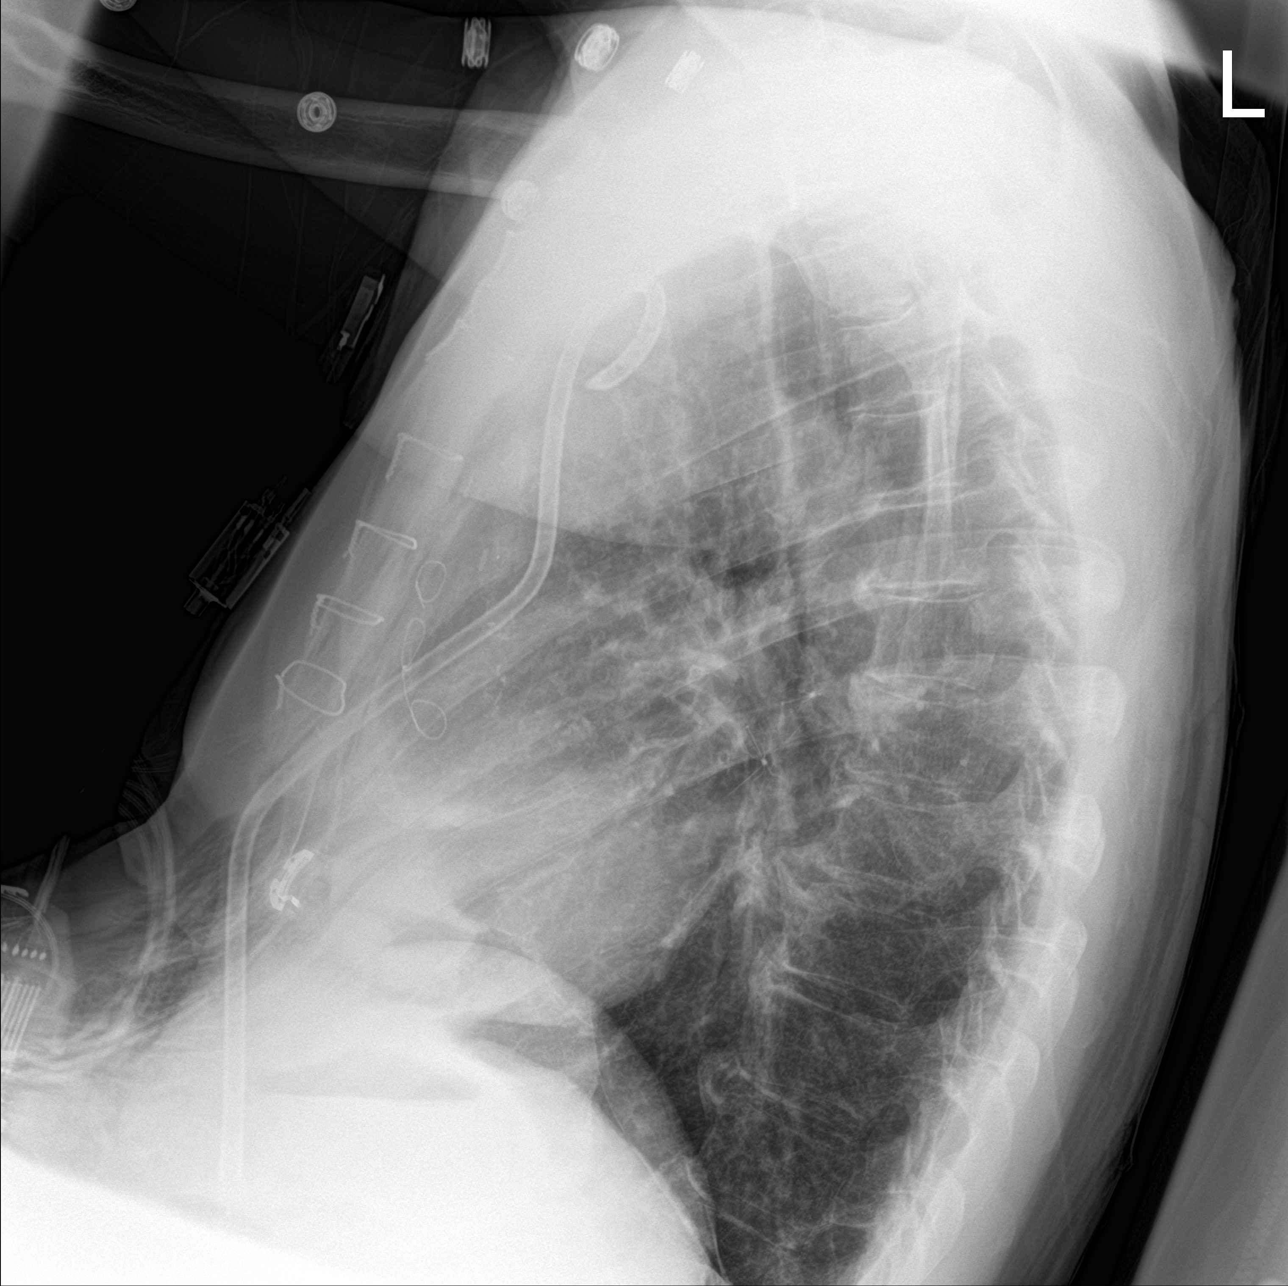

[chest ap]
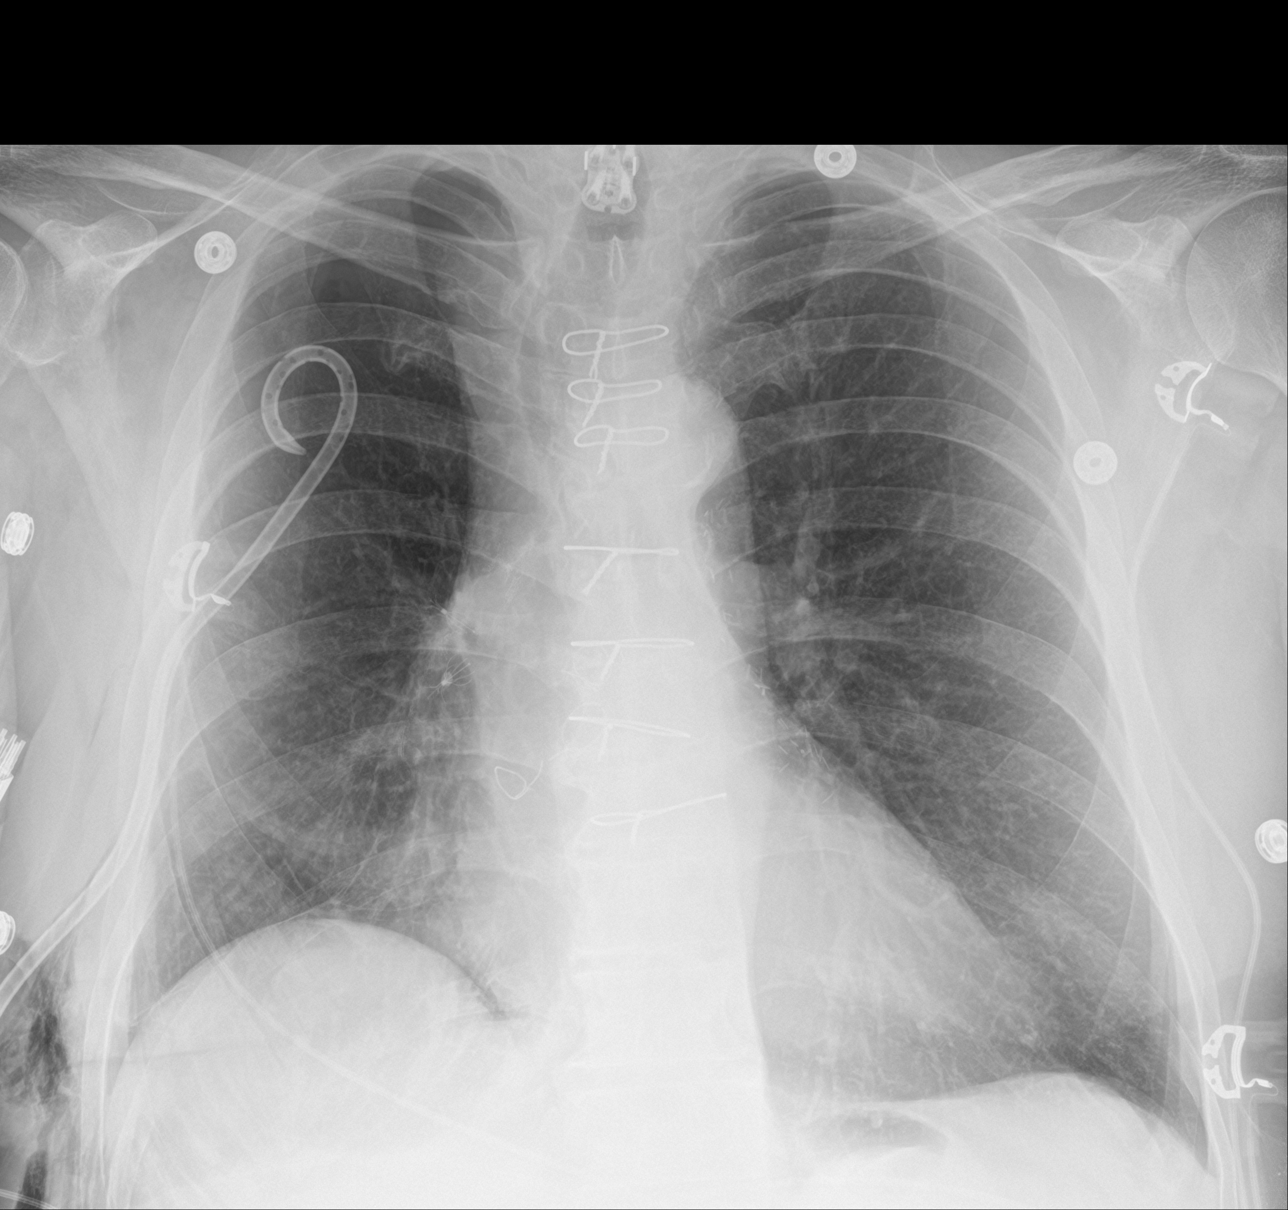

[chest lat (2 of 2)]
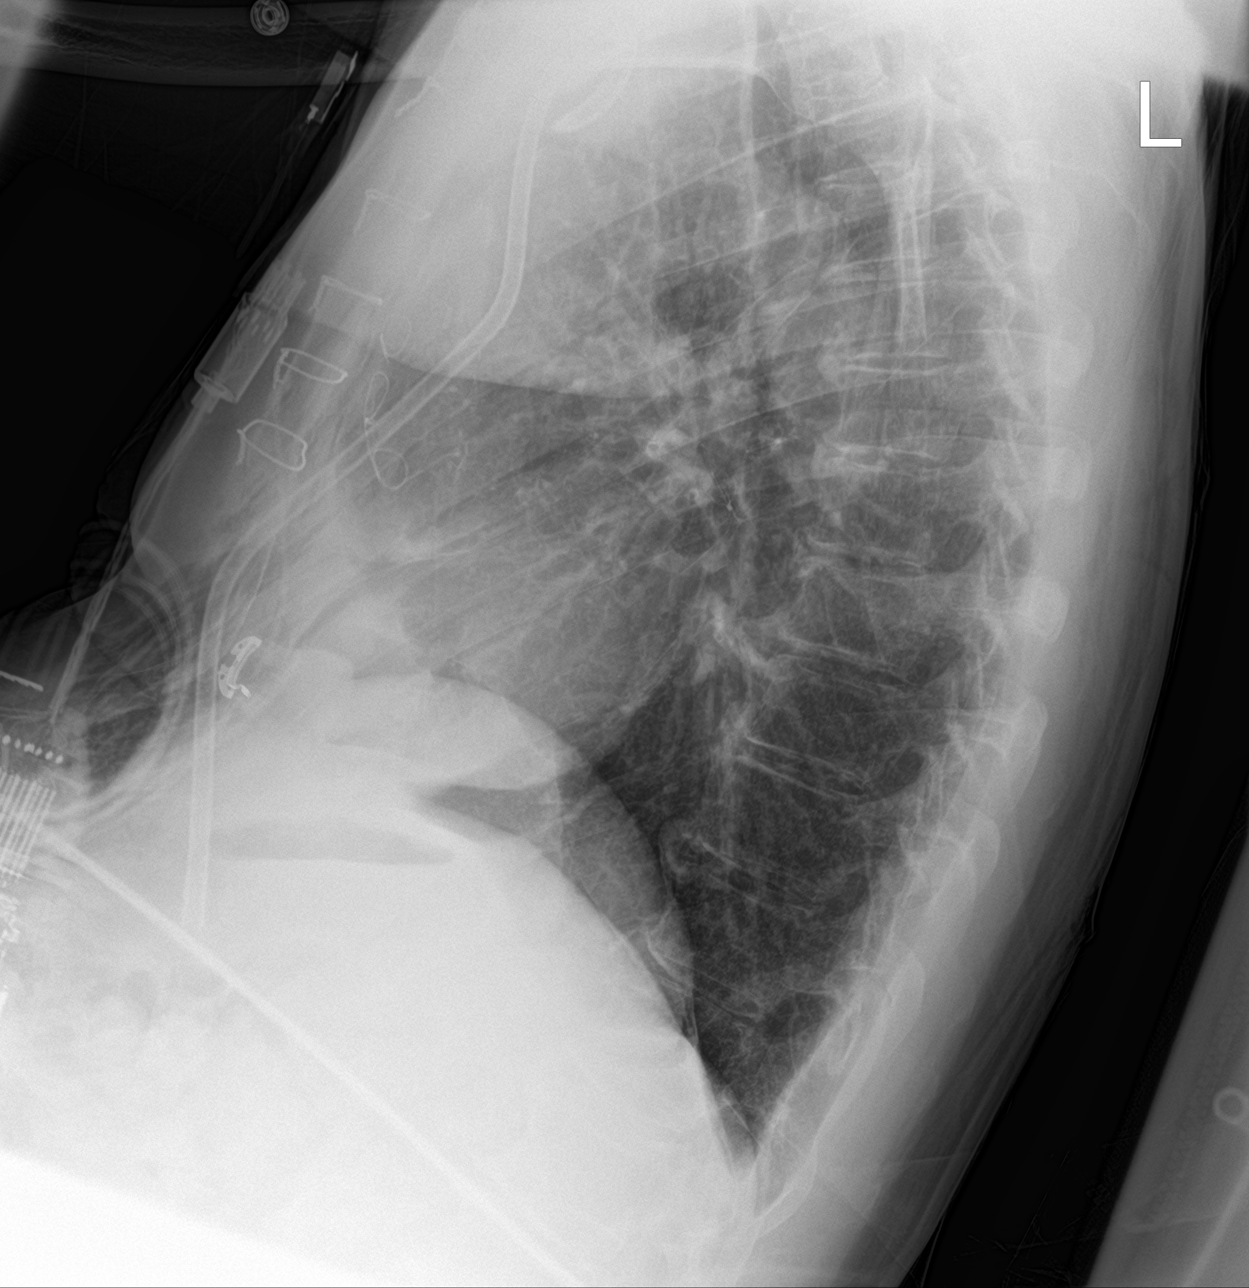

[3 of 3 positions shown; findings below may reference images not displayed]

FINDINGS: Chest tube is again noted on the right with essentially stable right
apical pneumothorax. No tension component. Postoperative changes
noted on the right medially, stable. No edema or airspace opacity.
Heart is upper normal in size. The pulmonary vascularity is stable
with postoperative change on the right and normal appearance on the
left. Evidence of previous coronary artery bypass grafting. No
adenopathy. Postoperative change at cervicothoracic junction. There
is aortic atherosclerosis.
IMPRESSION: Chest tube on the right again noted with stable right apical
pneumothorax. No tension component. Postoperative changes on the
right. No edema or airspace opacity. Stable cardiac silhouette.
Aortic Atherosclerosis ([id number]).

## 2022-11-01 ENCOUNTER — Ambulatory Visit: Payer: Medicare Other | Admitting: Diagnostic Neuroimaging

## 2022-11-01 IMAGING — DX DG CHEST 1V PORT
1 series · 1 of 1 positions shown · non-contrast
Comparison: January 01, 2021

CLINICAL DATA: Chest tube present

EXAM:
PORTABLE CHEST 1 VIEW

[chest]
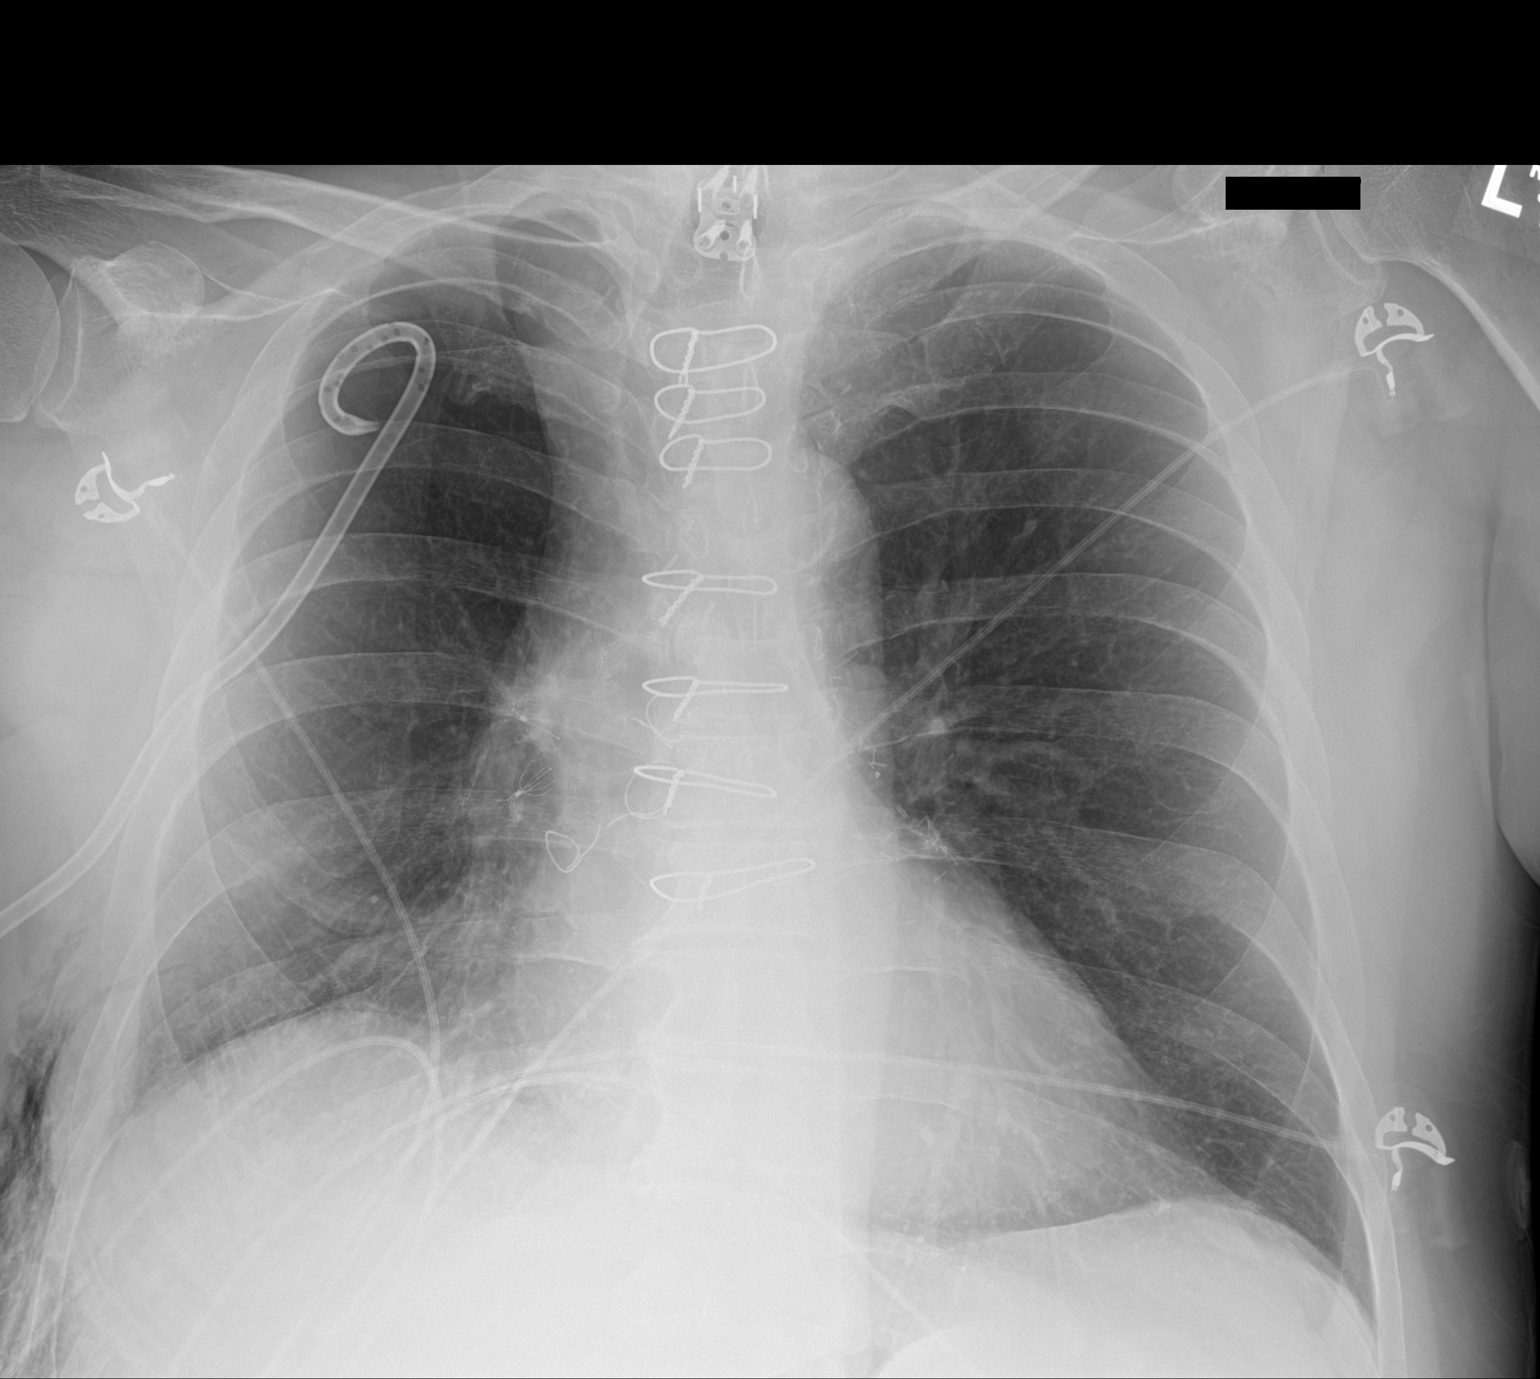

[1 of 1 positions shown; findings below may reference images not displayed]

FINDINGS: Right-sided chest tube present. Right apical region pneumothorax
appears slightly smaller compared to 1 day prior. No tension
component. There is subcutaneous air on the right.

There is no edema or airspace opacity. The heart size and pulmonary
vascularity normal. No adenopathy. There is aortic atherosclerosis.

Postoperative changes bilaterally with endobronchial valves noted on
the right.

Postoperative change noted at cervicothoracic junction region.
IMPRESSION: Right-sided pneumothorax slightly smaller compared to 1 day prior
with chest tube on the right again noted. No tension component.
Subcutaneous air again noted on the right.

No edema or airspace opacity. Postoperative changes with
endobronchial valves noted on the right. Stable cardiac silhouette.
Aortic Atherosclerosis ([id number]).

## 2022-11-03 IMAGING — DX DG CHEST 1V PORT
1 series · 1 of 1 positions shown · non-contrast
Comparison: 01/03/2021 and older exams.

CLINICAL DATA: Follow-up pneumothorax and chest tube.

EXAM:
PORTABLE CHEST 1 VIEW

[chest ap]
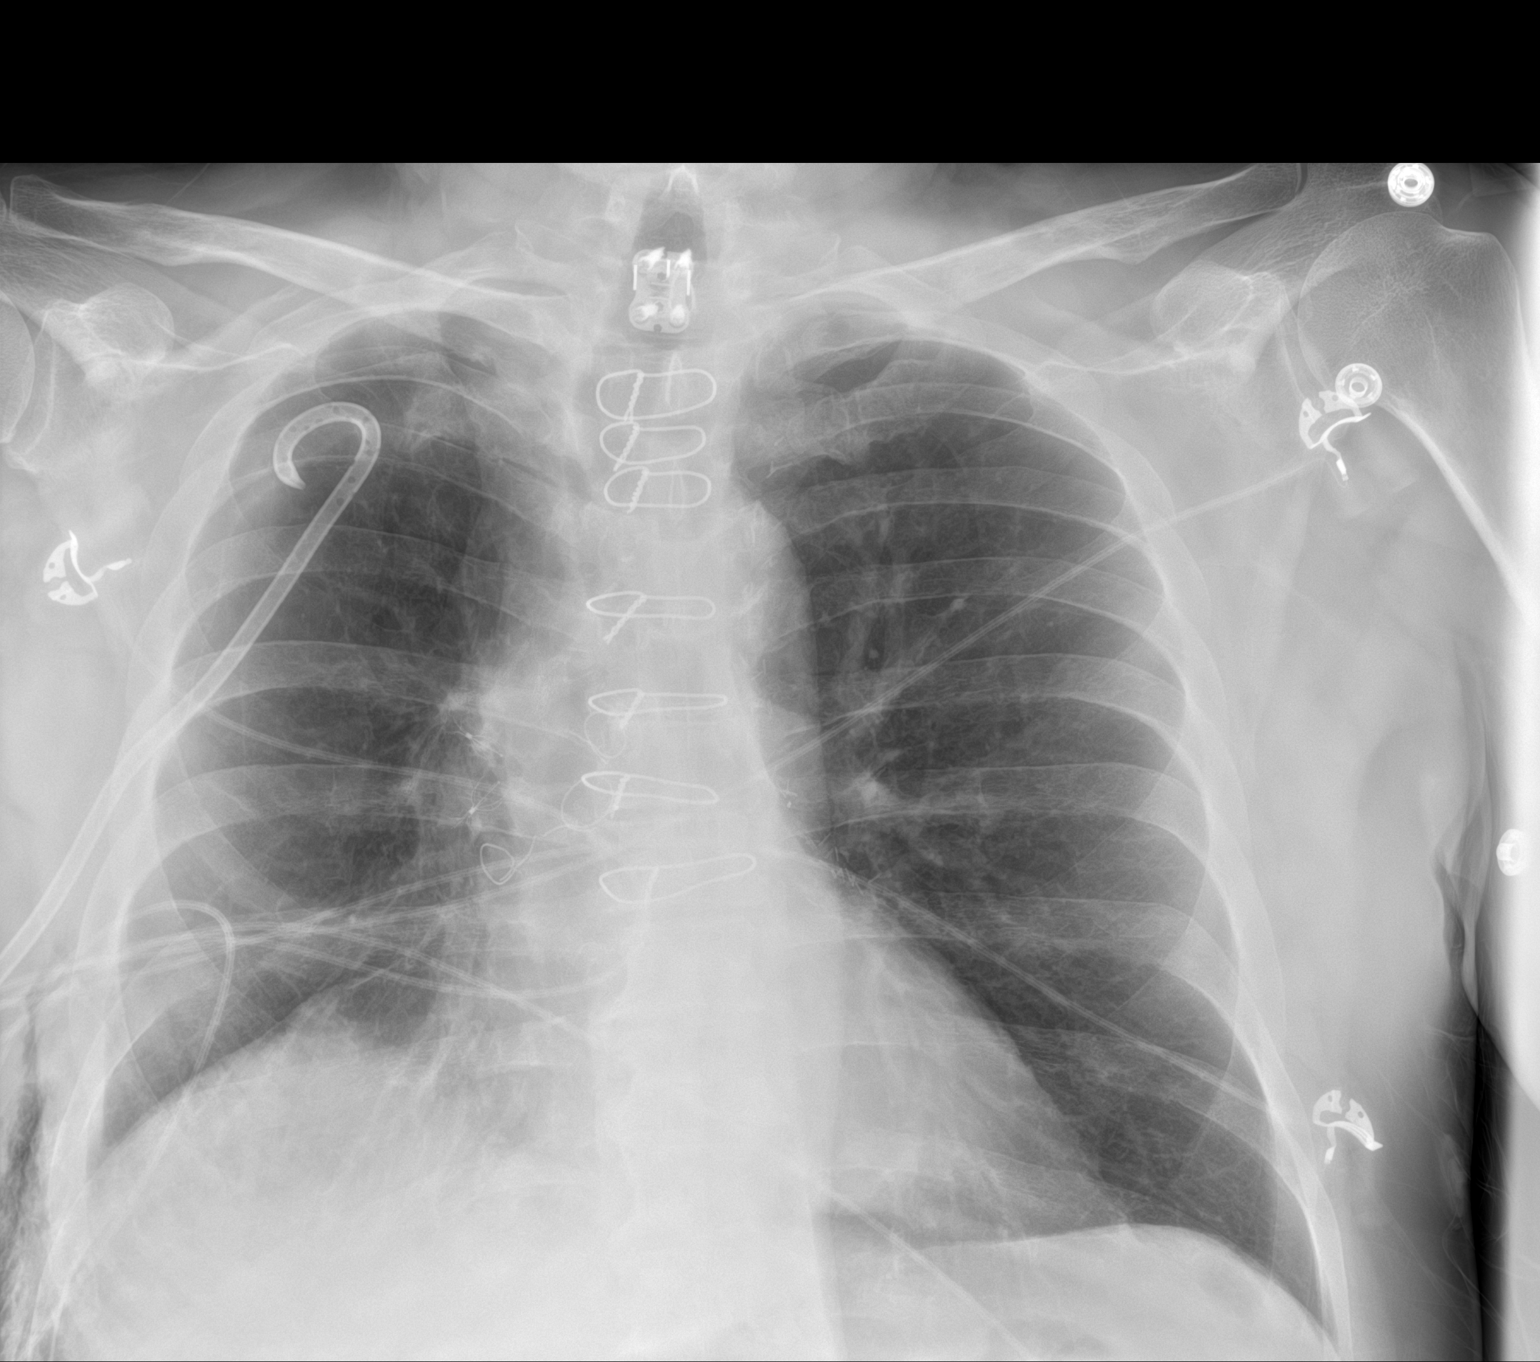

[1 of 1 positions shown; findings below may reference images not displayed]

FINDINGS: Interval decrease in the size of the right pneumothorax, now small,
noted at the right apex. Stable right chest tube.

Linear opacities at the medial right lung base are stable consistent
with atelectasis. Left lung is hyperexpanded but clear.
IMPRESSION: 1. Interval decrease in the size of the right pneumothorax, now
small.
2. No other change.  Stable right chest tube.

## 2022-11-04 ENCOUNTER — Ambulatory Visit: Payer: Medicare Other | Admitting: Psychology

## 2022-11-04 IMAGING — DX DG CHEST 1V PORT
1 series · 1 of 1 positions shown · non-contrast
Comparison: 01/04/2021

CLINICAL DATA: Pneumothorax, chest tube

EXAM:
PORTABLE CHEST 1 VIEW

[chest]
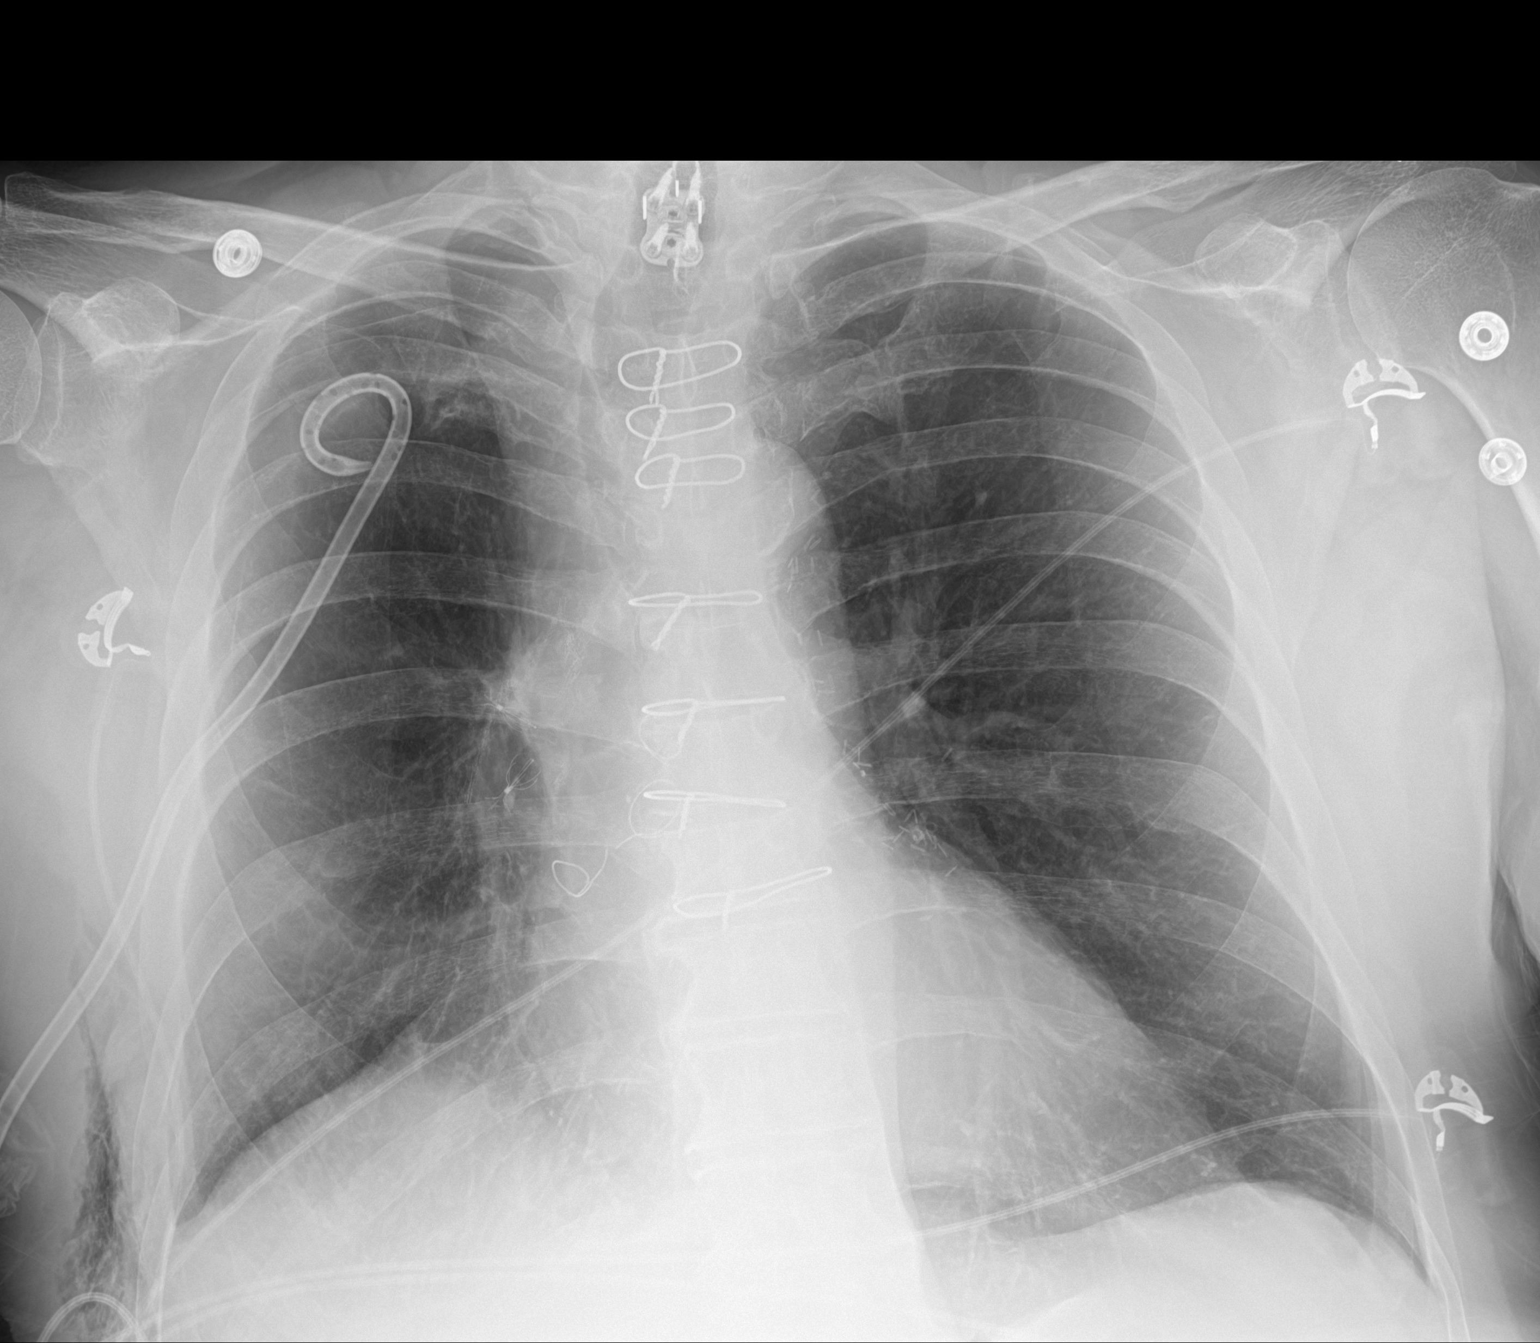

[1 of 1 positions shown; findings below may reference images not displayed]

FINDINGS: Right apical pneumothorax remains present and without substantial
change. Chest tube is present. No new findings. Stable heart size.
Residual right chest wall emphysema.
IMPRESSION: Stable small right apical pneumothorax with chest tube present.

## 2022-11-05 IMAGING — DX DG CHEST 1V
1 series · 1 of 1 positions shown · non-contrast
Comparison: January 06, 2021 study obtained earlier in the day

CLINICAL DATA: Chest tube clamped

EXAM:
CHEST  1 VIEW

[chest ap]
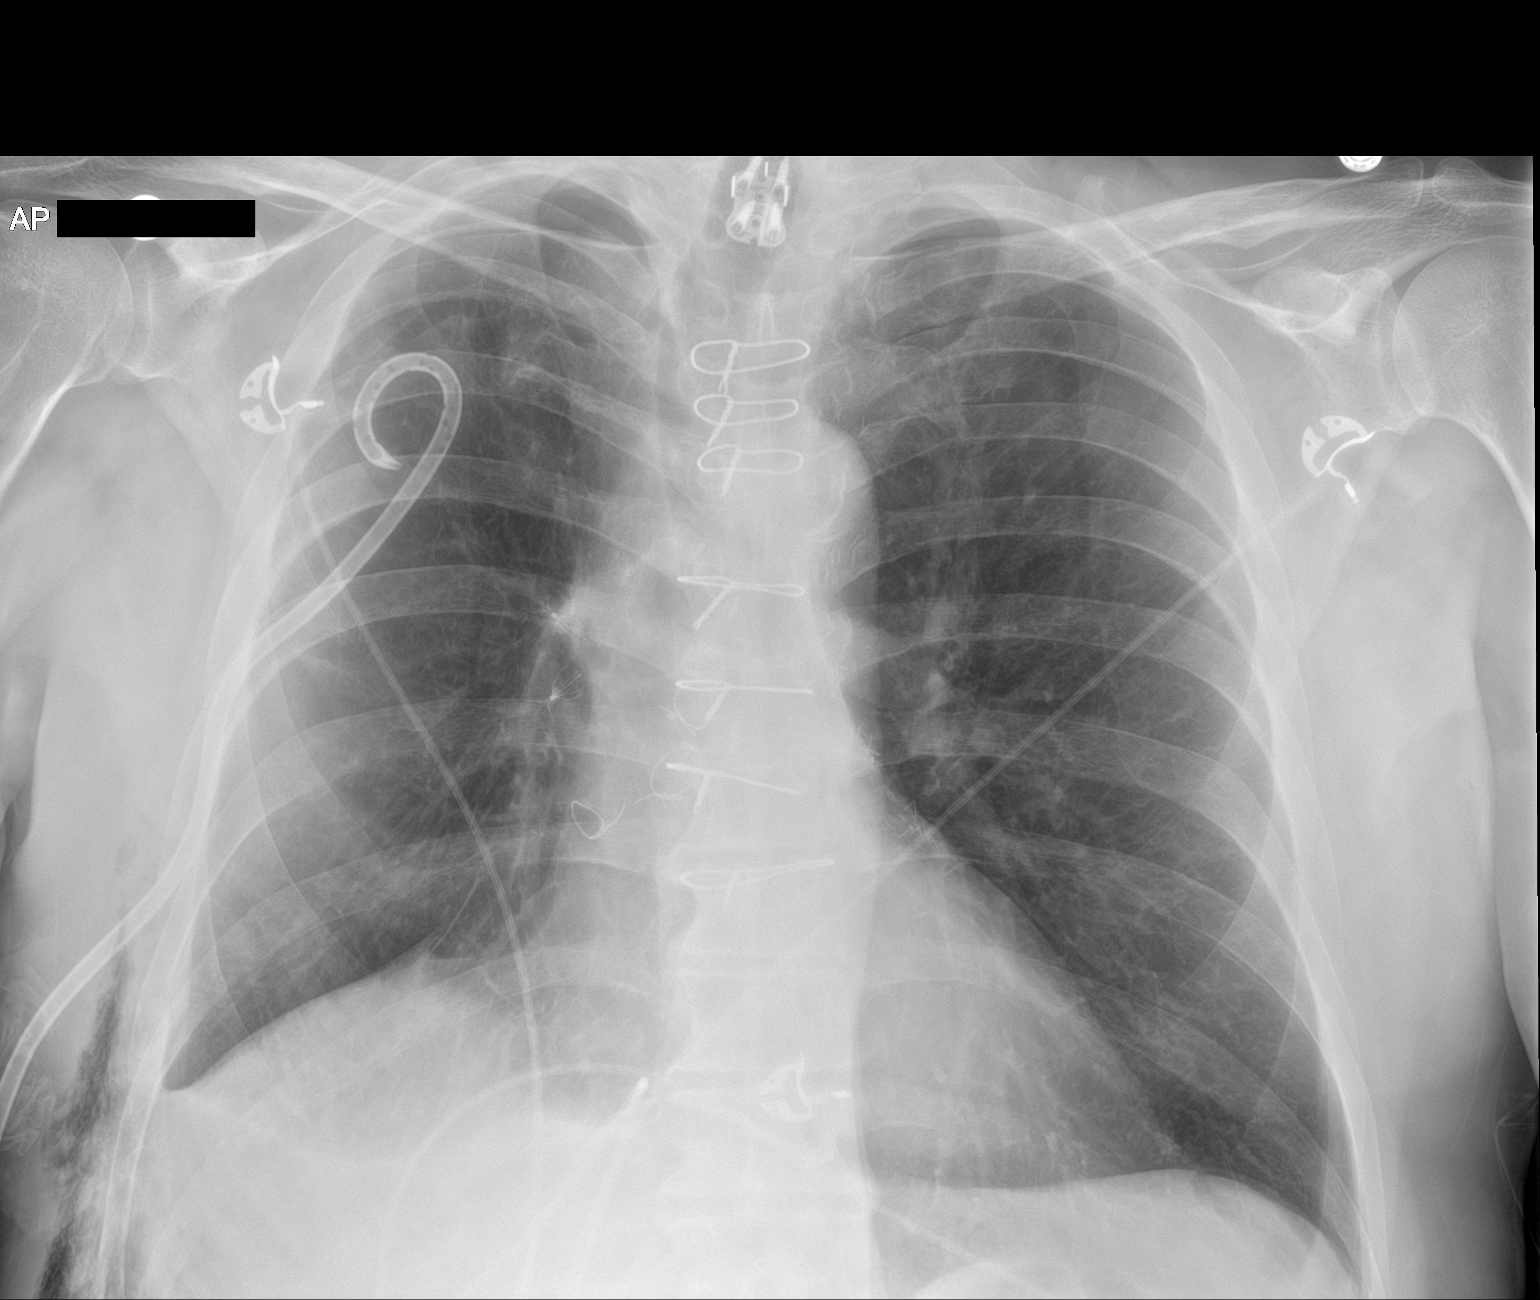

[1 of 1 positions shown; findings below may reference images not displayed]

FINDINGS: Chest tube position unchanged. Pneumothorax on the right may be
slightly smaller than on earlier in the day. Subcutaneous air again
noted on the right. There is mild scarring in the right base
medially. There is equivocal right pleural effusion. No edema or
airspace opacity. Heart size and pulmonary vascularity are normal.
Status post coronary artery bypass grafting. Postoperative change in
lower cervical spine. No bone lesions.
IMPRESSION: Chest tube again noted on the right with right apical pneumothorax
slightly smaller compared to earlier in the day. Equivocal right
pleural effusion with mild scarring right base. No edema or airspace
opacity. Stable cardiac silhouette.

## 2022-11-05 IMAGING — DX DG CHEST 1V PORT
1 series · 1 of 1 positions shown · non-contrast
Comparison: 01/05/2021

CLINICAL DATA: Chest tube, pneumothorax

EXAM:
PORTABLE CHEST 1 VIEW

[chest ap]
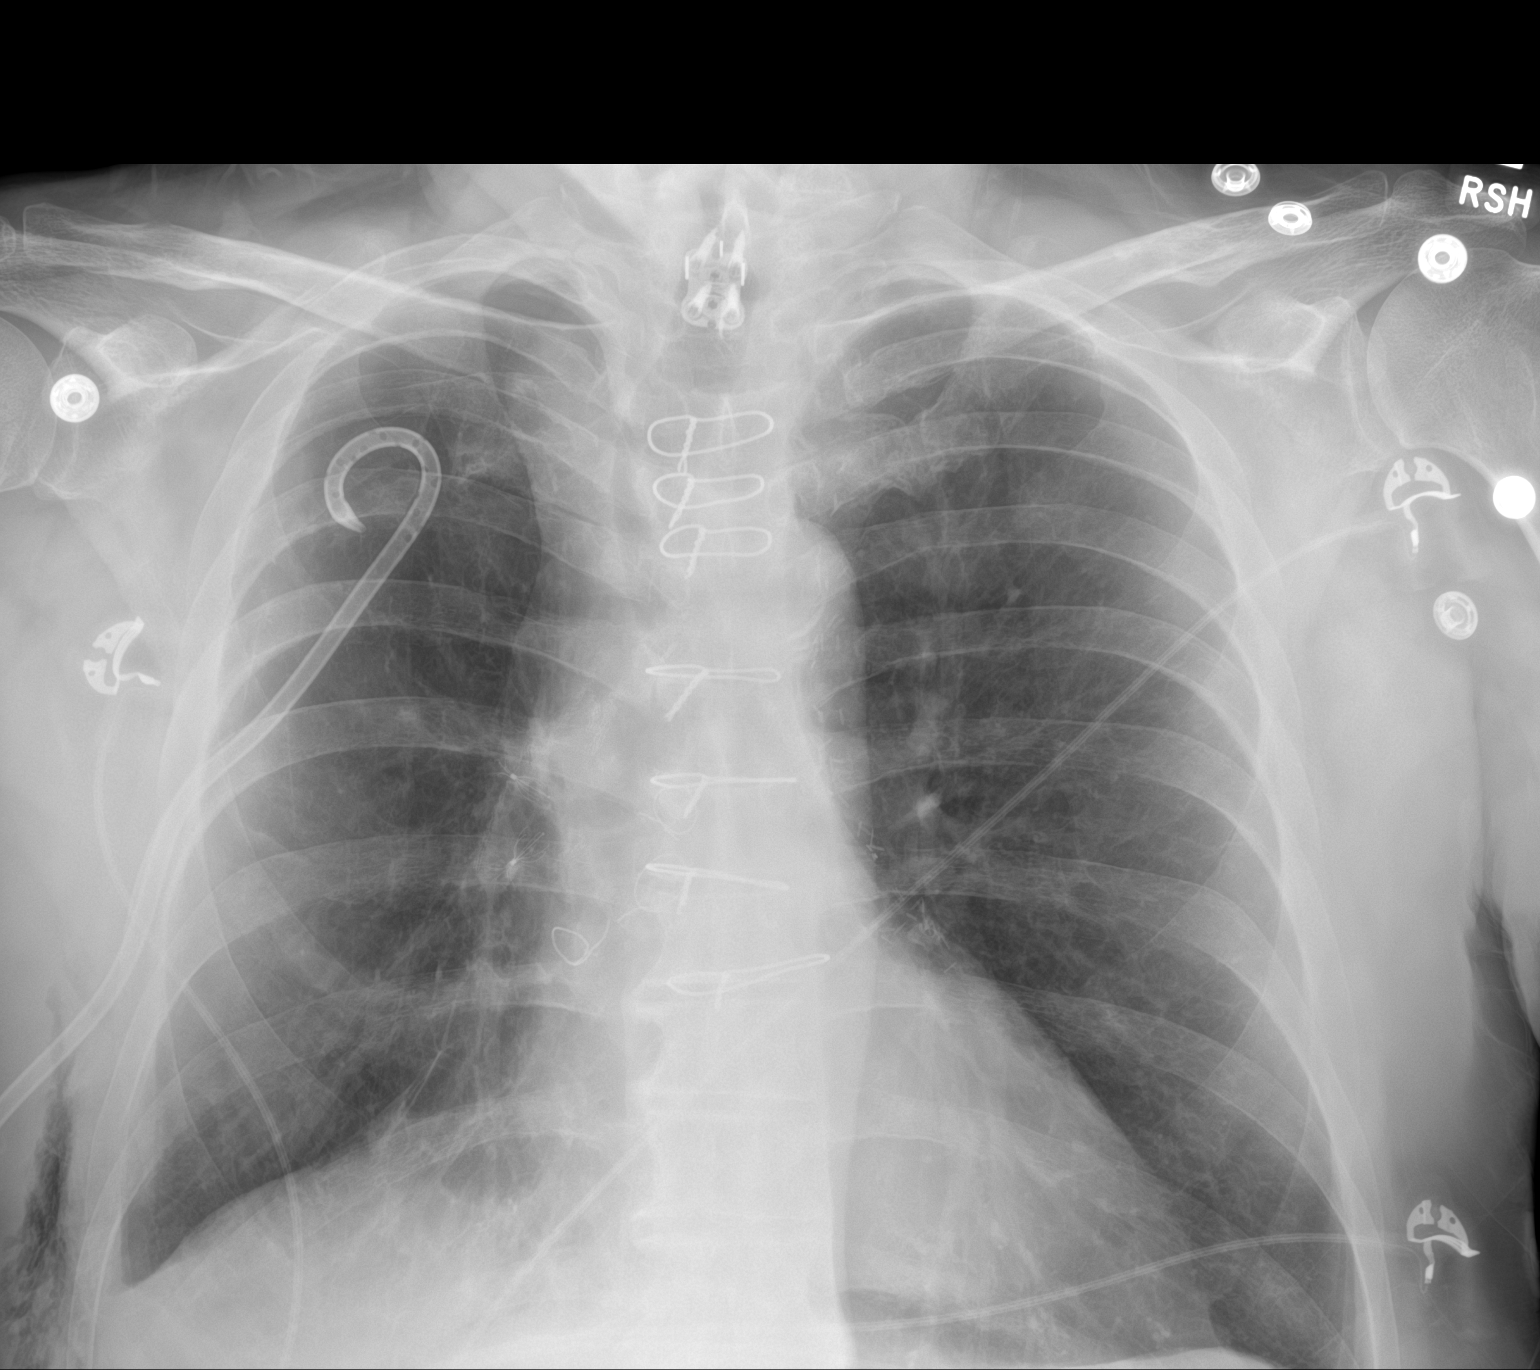

[1 of 1 positions shown; findings below may reference images not displayed]

FINDINGS: Right chest tube remains in place, unchanged. Small right apical
pneumothorax, stable since prior study. Prior CABG. No confluent
opacities or effusions. Heart is normal size.
IMPRESSION: Small right apical pneumothorax right chest tube in place. No
significant change since prior study.

## 2022-11-06 IMAGING — DX DG CHEST 1V PORT
1 series · 1 of 1 positions shown · non-contrast
Comparison: 01/06/2021

CLINICAL DATA: Chest tube removed

EXAM:
PORTABLE CHEST 1 VIEW

[chest ap]
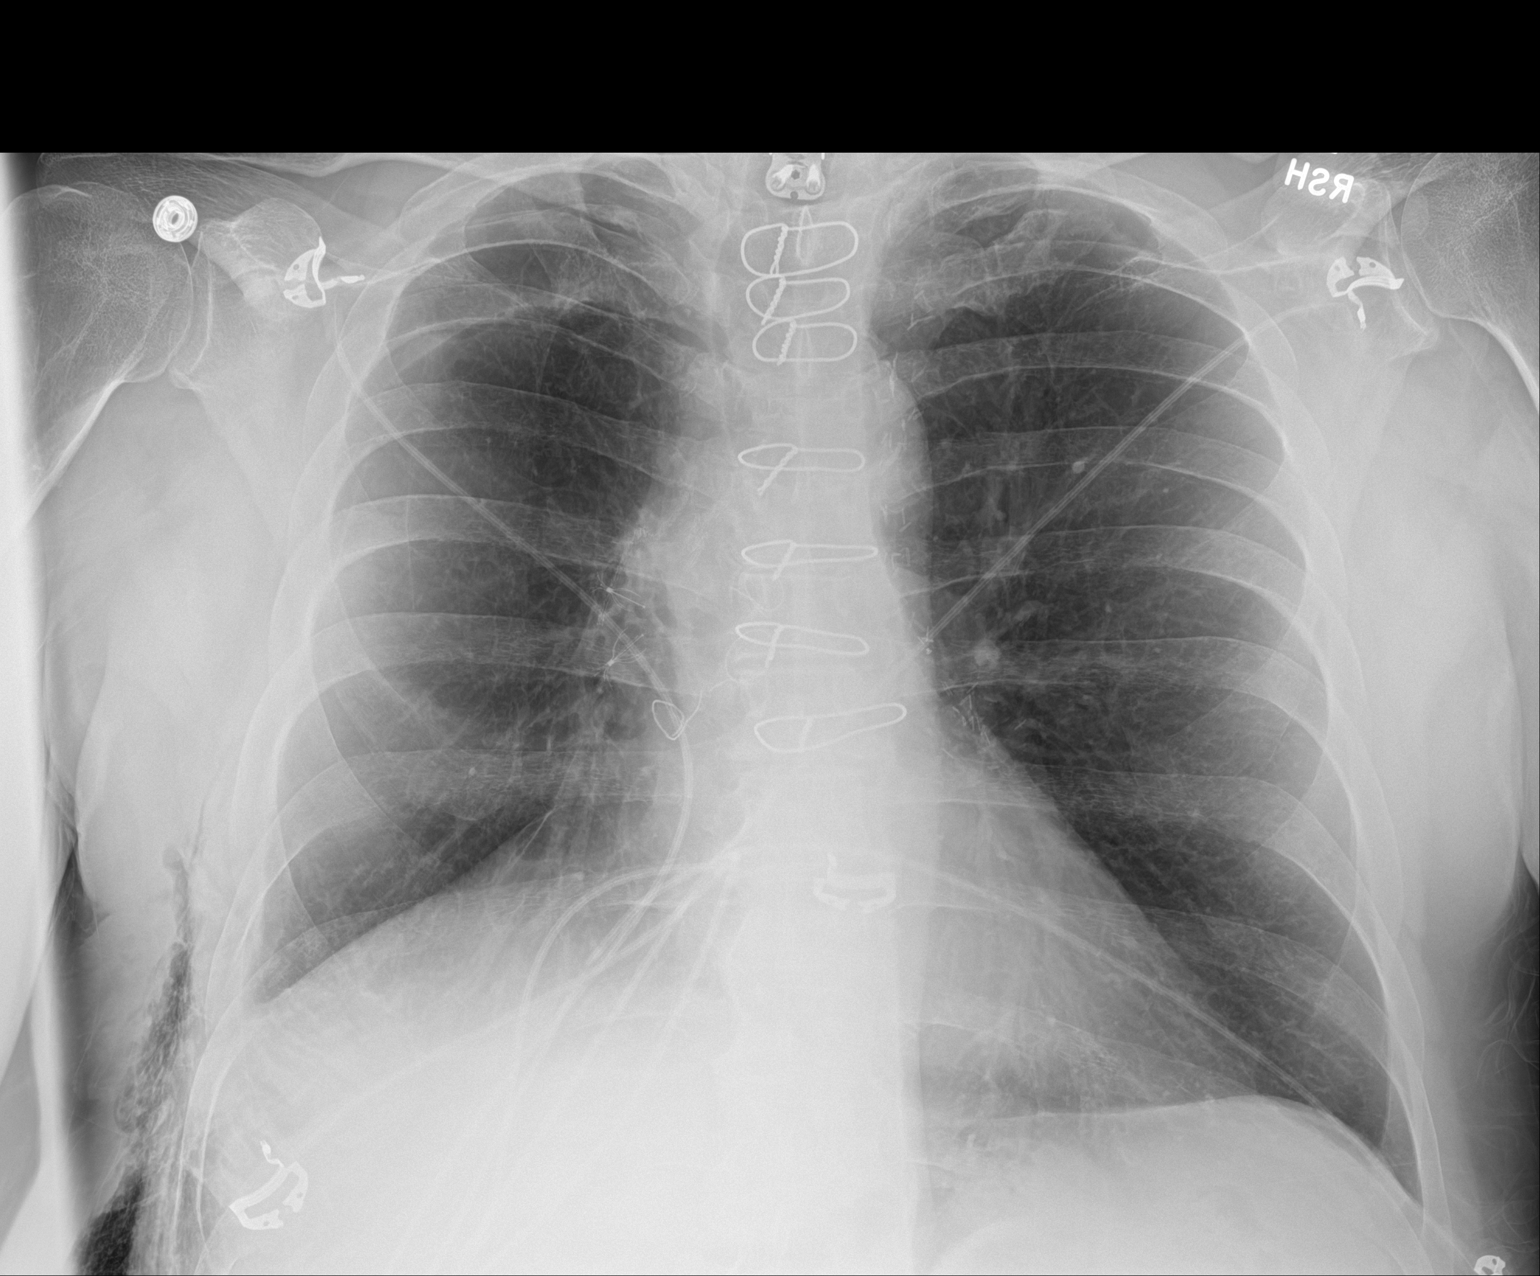

[1 of 1 positions shown; findings below may reference images not displayed]

FINDINGS: Interval removal of right chest tube. Stable small right apical
pneumothorax. Subcutaneous emphysema in the right chest wall is
stable. No confluent opacities or effusions. Heart is normal size.
IMPRESSION: Interval removal of right chest tube with stable small right apical
pneumothorax.

## 2022-11-26 IMAGING — MR MR HEAD WO/W CM
13 series · 48 of 48 positions shown · IV contrast (gadavist)
Comparison: 02/23/2020

CLINICAL DATA: High-grade neuroendocrine carcinoma. Assess for
metastatic disease.

EXAM:
MRI HEAD WITHOUT AND WITH CONTRAST
TECHNIQUE: Multiplanar, multiecho pulse sequences of the brain and surrounding
structures were obtained without and with intravenous contrast.
CONTRAST:  8mL GADAVIST GADOBUTROL 1 MMOL/ML IV SOLN

[Series 5: DWI · axial · 3.0mm · 1.36mm/px · z∈[-81,+76]mm · 7 of 108 slices shown (1 of 2)]
[im 1/108]
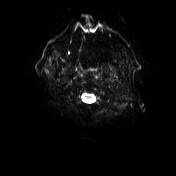
[im 18/108]
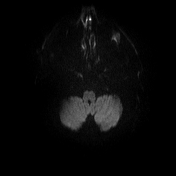
[im 36/108]
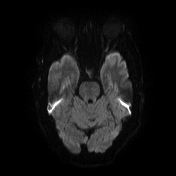
[im 54/108]
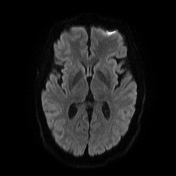
[im 72/108]
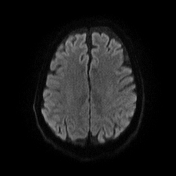
[im 90/108]
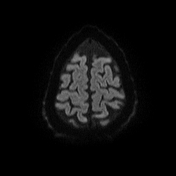
[im 108/108]
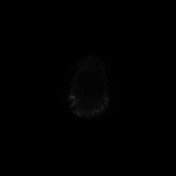

[Series 6: DWI · axial · 3.0mm · 1.36mm/px · z∈[-81,+76]mm · 3 of 54 slices shown (2 of 2)]
[im 1/54]
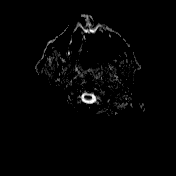
[im 27/54]
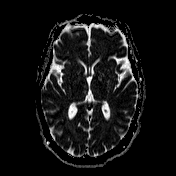
[im 54/54]
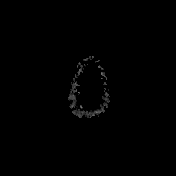

[Series 7: T1 · sagittal · 5.0mm · 0.75mm/px · 1 of 24 slices shown (1 of 2)]
[im 1/24]
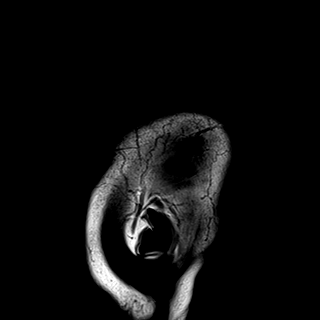

[Series 8: T2 · axial · 5.0mm · 0.62mm/px · 1 of 26 slices shown]
[im 1/26]
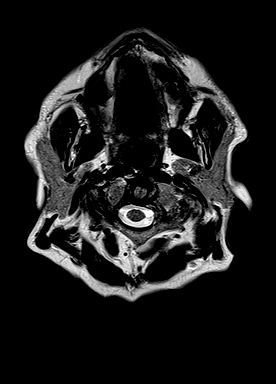

[Series 9: swi_images · axial · 3.0mm · 0.75mm/px · z∈[-98,+77]mm · 3 of 60 slices shown]
[im 1/60]
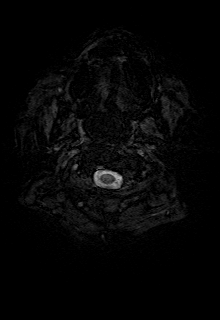
[im 30/60]
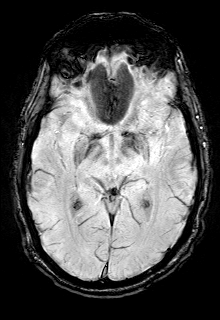
[im 60/60]
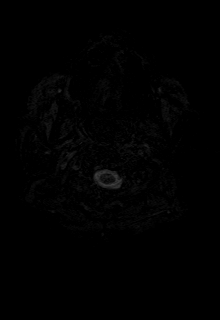

[Series 11: FLAIR · axial · 3.0mm · 0.75mm/px · z∈[-91,+70]mm · 3 of 55 slices shown]
[im 1/55]
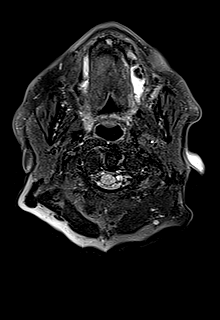
[im 28/55]
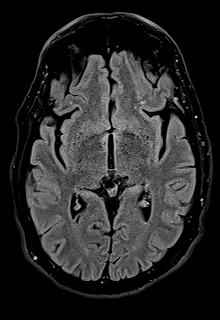
[im 55/55]
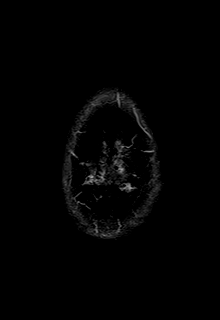

[Series 12: T1 · axial · 1.0mm · 0.94mm/px · z∈[-100,+73]mm · 10 of 176 slices shown (2 of 2)]
[im 1/176]
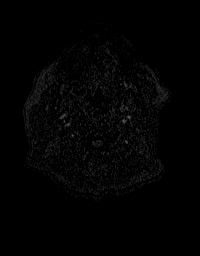
[im 20/176]
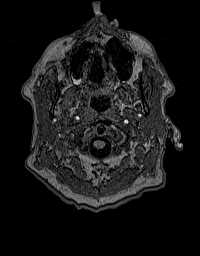
[im 39/176]
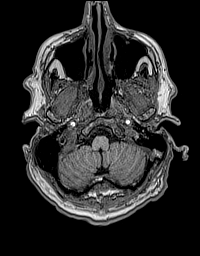
[im 59/176]
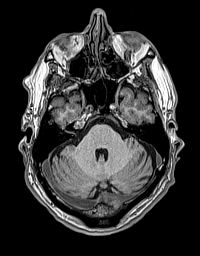
[im 78/176]
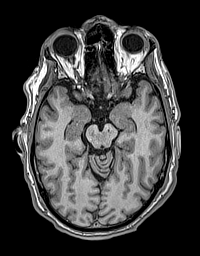
[im 98/176]
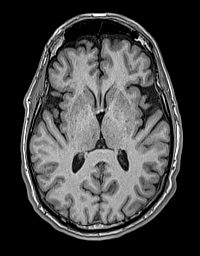
[im 117/176]
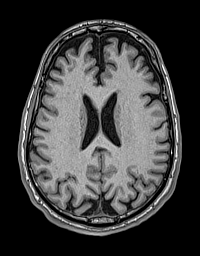
[im 137/176]
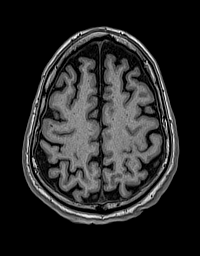
[im 156/176]
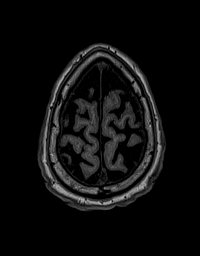
[im 176/176]
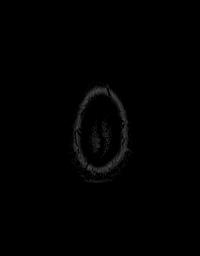

[Series 13: cor dwi_tracew · coronal · 5.0mm · 1.53mm/px · 3 of 60 slices shown]
[im 1/60]
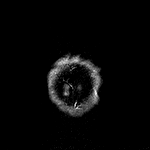
[im 30/60]
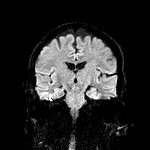
[im 60/60]
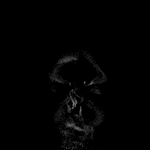

[Series 14: cor dwi_adc · coronal · 5.0mm · 1.53mm/px · 2 of 30 slices shown]
[im 1/30]
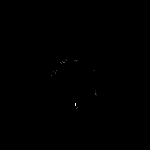
[im 30/30]
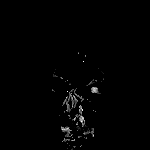

[Series 15: T2 post-contrast · coronal · 5.0mm · 0.57mm/px · 2 of 31 slices shown]
[im 1/31]
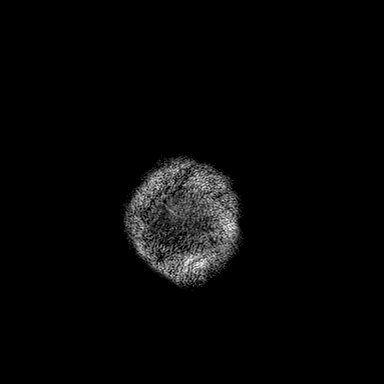
[im 31/31]
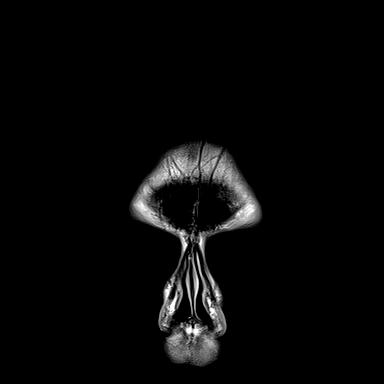

[Series 16: T1 post-contrast · axial · 1.0mm · 0.94mm/px · z∈[-100,+73]mm · 10 of 176 slices shown (1 of 3)]
[im 1/176]
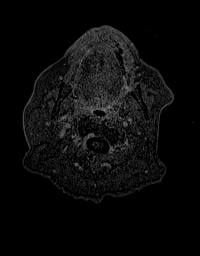
[im 20/176]
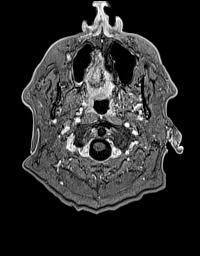
[im 39/176]
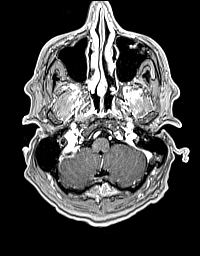
[im 59/176]
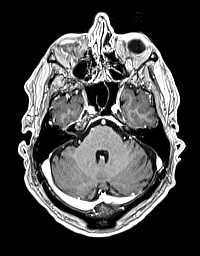
[im 78/176]
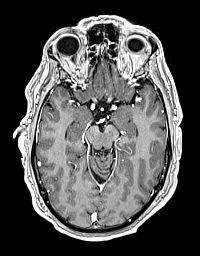
[im 98/176]
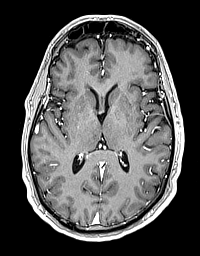
[im 117/176]
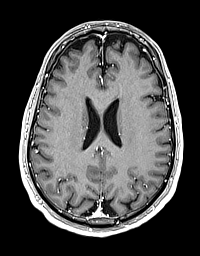
[im 137/176]
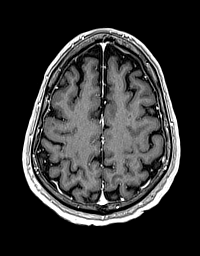
[im 156/176]
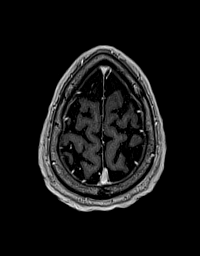
[im 176/176]
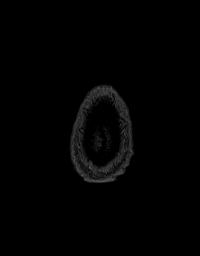

[Series 17: T1 post-contrast · coronal · 5.0mm · 0.43mm/px · 2 of 31 slices shown (2 of 3)]
[im 1/31]
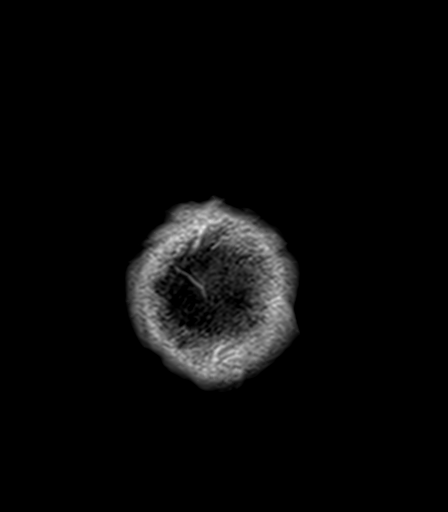
[im 31/31]
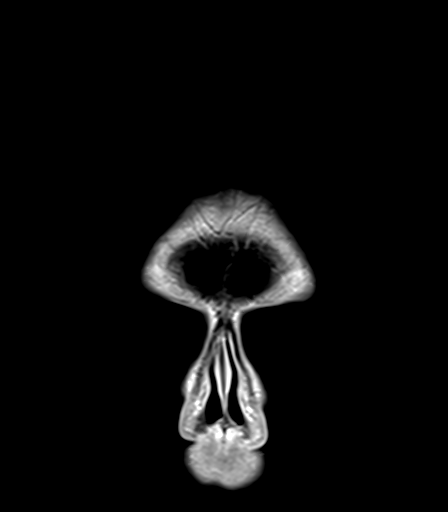

[Series 18: T1 post-contrast · sagittal · 5.0mm · 0.75mm/px · 1 of 24 slices shown (3 of 3)]
[im 1/24]
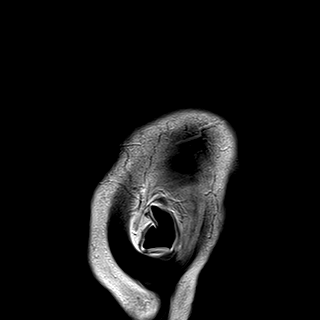

[48 of 48 positions shown; findings below may reference images not displayed]

FINDINGS: Brain: Diffusion imaging does not show any acute or subacute
infarction. Newly seen 6.5 mm ring-enhancing lesion in the left
lateral cerebellum consistent with a small metastasis. Minimal
adjacent edema. There is some susceptibility artifact associated
with this metastasis likely indicating microhemorrhage. No second
intracranial metastatic lesion. Minimal small vessel change affects
the hemispheric white matter. No large vessel territory infarction.
No hydrocephalus or extra-axial collection.

Vascular: Major vessels at the base of the brain show flow.

Skull and upper cervical spine: Negative

Sinuses/Orbits: Clear/normal

Other: None
IMPRESSION: 6.5 mm ring-enhancing lesion of the left lateral cerebellum with
some hemosiderin/blood products, consistent with a solitary
metastasis. Minimal adjacent edema. No second lesion identified.

## 2022-12-04 IMAGING — MR MR HEAD WO/W CM
12 series · 48 of 48 positions shown · IV contrast (multihance)
Comparison: 01/27/2021

CLINICAL DATA: Follow-up CNS neoplasm

EXAM:
MRI HEAD WITHOUT AND WITH CONTRAST
TECHNIQUE: Multiplanar, multiecho pulse sequences of the brain and surrounding
structures were obtained without and with intravenous contrast.
CONTRAST:  18mL MULTIHANCE GADOBENATE DIMEGLUMINE 529 MG/ML IV SOLN

[Series 2: FLAIR · sagittal · 3.0mm · 0.75mm/px · 3 of 39 slices shown (1 of 2)]
[im 1/39]
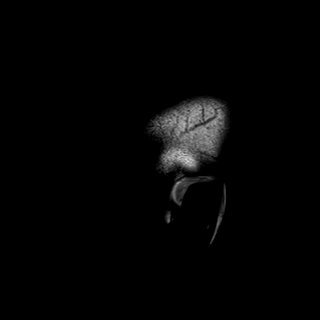
[im 20/39]
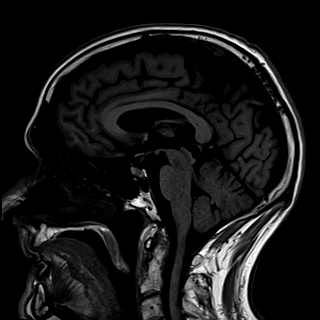
[im 39/39]
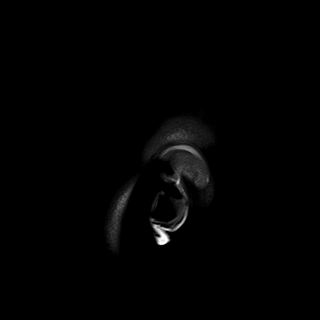

[Series 3: DWI · axial · 3.0mm · 1.50mm/px · z∈[-67,+81]mm · 4 of 78 slices shown (1 of 2)]
[im 1/78]
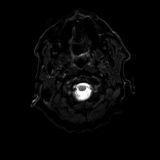
[im 26/78]
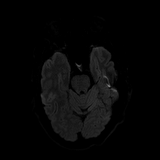
[im 52/78]
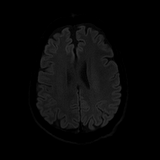
[im 78/78]
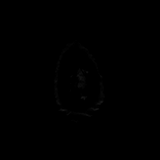

[Series 4: DWI · axial · 3.0mm · 1.50mm/px · z∈[-67,+81]mm · 2 of 39 slices shown (2 of 2)]
[im 1/39]
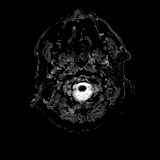
[im 39/39]
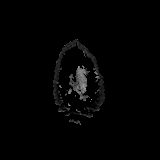

[Series 5: T2 · axial · 5.0mm · 0.57mm/px · 1 of 27 slices shown (1 of 2)]
[im 1/27]
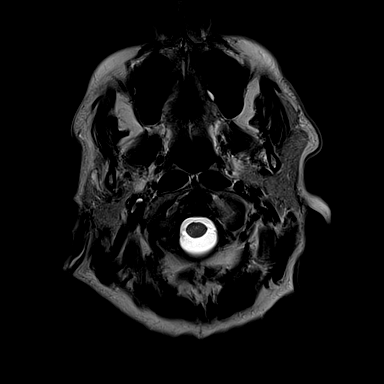

[Series 6: swi_images · axial · 1.5mm · 0.90mm/px · z∈[-61,+92]mm · 5 of 104 slices shown]
[im 1/104]
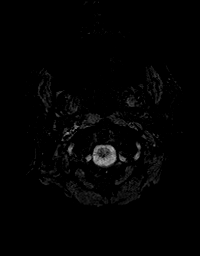
[im 26/104]
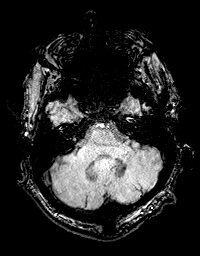
[im 52/104]
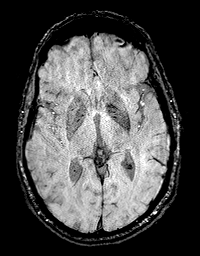
[im 78/104]
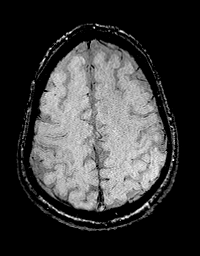
[im 104/104]
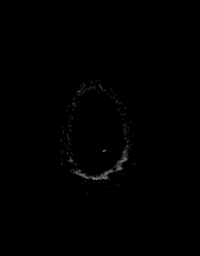

[Series 8: FLAIR · axial · 3.0mm · 0.86mm/px · z∈[-88,+83]mm · 3 of 58 slices shown (2 of 2)]
[im 1/58]
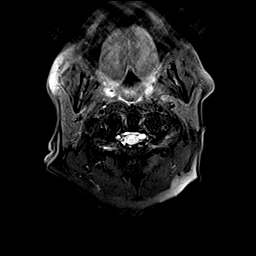
[im 29/58]
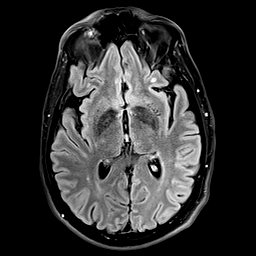
[im 58/58]
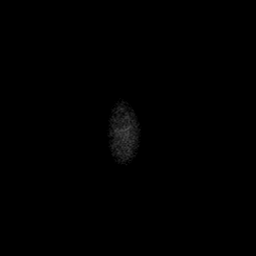

[Series 9: T2 · axial · non-contrast · 1.0mm · 0.86mm/px · z∈[-87,+84]mm · 8 of 176 slices shown (2 of 2)]
[im 1/176]
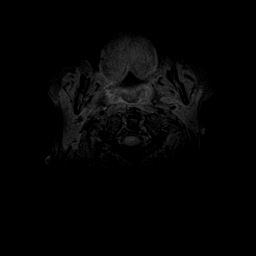
[im 26/176]
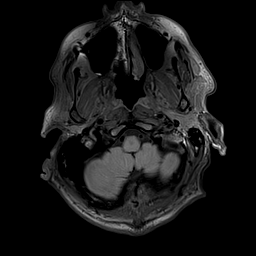
[im 51/176]
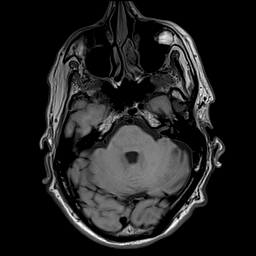
[im 76/176]
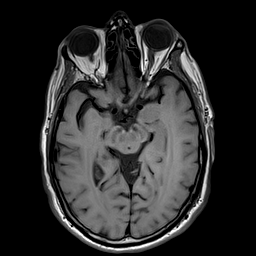
[im 101/176]
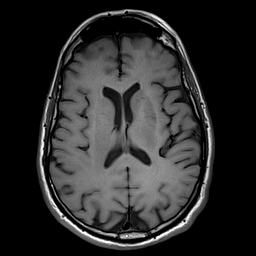
[im 126/176]
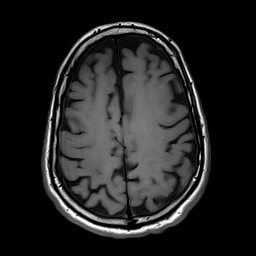
[im 151/176]
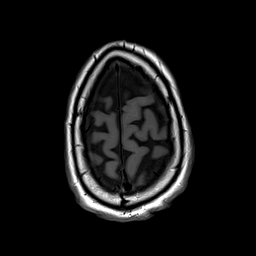
[im 176/176]
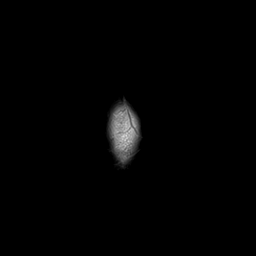

[Series 10: T2 post-contrast · coronal · 3.0mm · 0.57mm/px · 2 of 52 slices shown (1 of 2)]
[im 1/52]
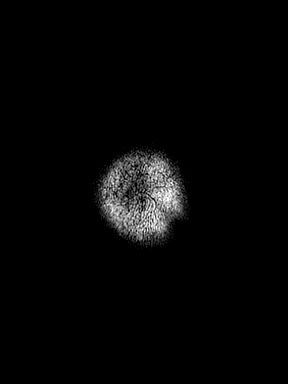
[im 52/52]
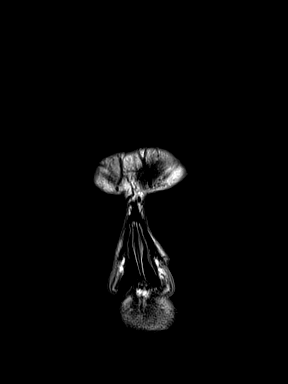

[Series 11: T2 post-contrast · axial · 1.0mm · 0.86mm/px · z∈[-87,+84]mm · 8 of 176 slices shown (2 of 2)]
[im 1/176]
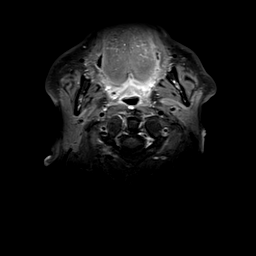
[im 26/176]
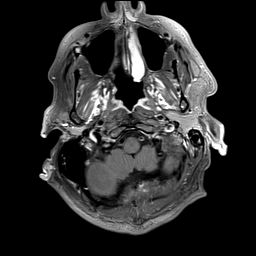
[im 51/176]
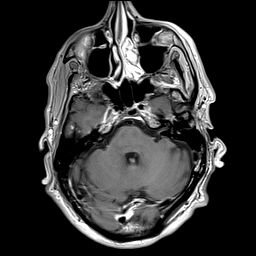
[im 76/176]
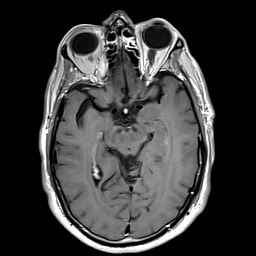
[im 101/176]
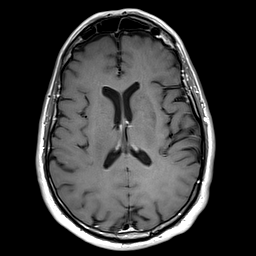
[im 126/176]
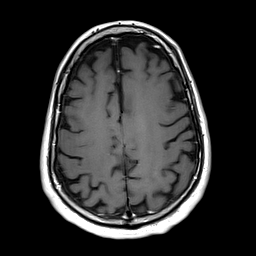
[im 151/176]
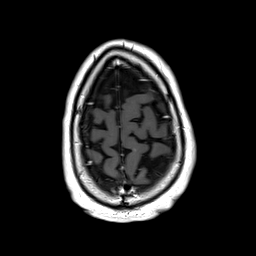
[im 176/176]
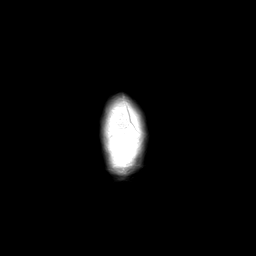

[Series 12: T1 post-contrast · axial · 1.0mm · 0.75mm/px · z∈[-86,+89]mm · 8 of 176 slices shown (1 of 2)]
[im 1/176]
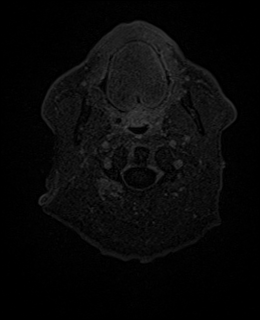
[im 26/176]
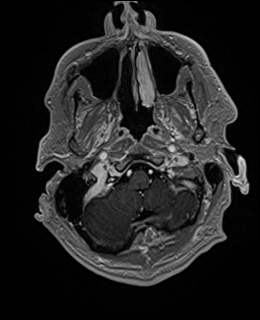
[im 51/176]
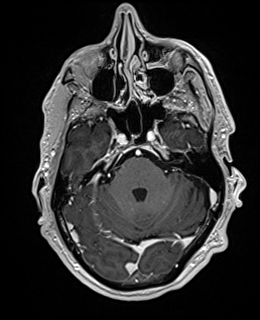
[im 76/176]
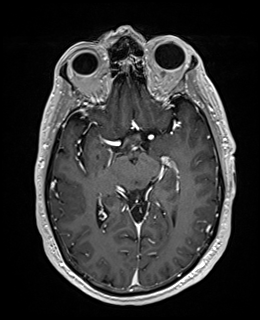
[im 101/176]
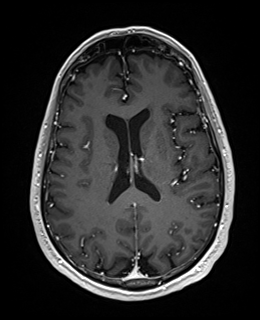
[im 126/176]
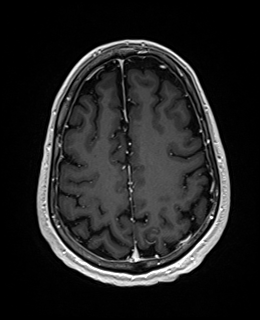
[im 151/176]
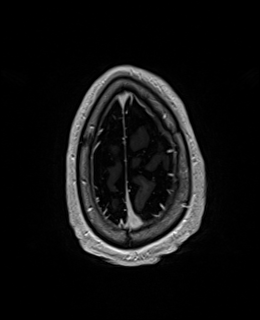
[im 176/176]
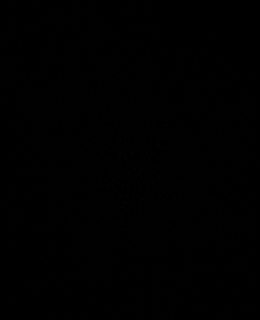

[Series 13: T1 post-contrast · coronal · 3.0mm · 0.57mm/px · 2 of 52 slices shown (2 of 2)]
[im 1/52]
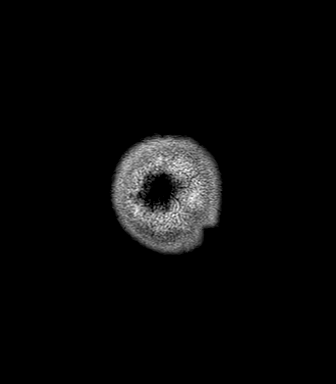
[im 52/52]
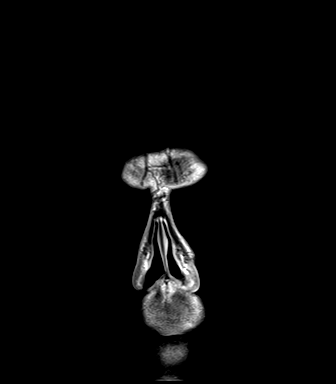

[Series 14: FLAIR post-contrast · sagittal · 3.0mm · 0.75mm/px · 2 of 39 slices shown]
[im 1/39]
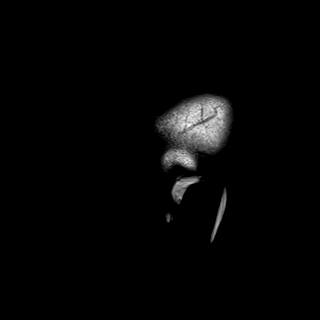
[im 39/39]
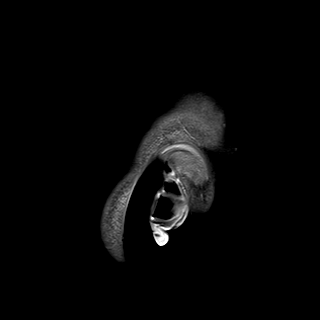

[48 of 48 positions shown; findings below may reference images not displayed]

FINDINGS: BRAIN

New Lesions:

3 mm nodule at the high left and parasagittal frontal lobe seen on

Larger lesions: None.

Stable or Smaller lesions:

6 mm mm enhancing lesion located in the lateral left cerebellum and
seen on [DATE].

Other Brain findings: Mild vasogenic edema around the left
cerebellar metastasis. No acute infarct, acute hemorrhage,
hydrocephalus, or collection.

Vascular: Normal flow voids and vascular enhancements

Skull and upper cervical spine: Normal marrow signal

Sinuses/Orbits: Negative
IMPRESSION: 1. 3 mm newly seen metastasis in the high left frontal lobe.
2. 6 mm unchanged left cerebellar metastasis.

## 2022-12-06 IMAGING — XA IR IMAGING GUIDED PORT INSERTION
2 series · 2 of 2 positions shown · non-contrast
Comparison: none

INDICATION: 64-year-old male with large cell neuroendocrine tumor of the right
lung. He presents for port catheter placement to establish durable
venous access.

[Series 1: single · 1 of 1 slices shown]
[im 1/1]
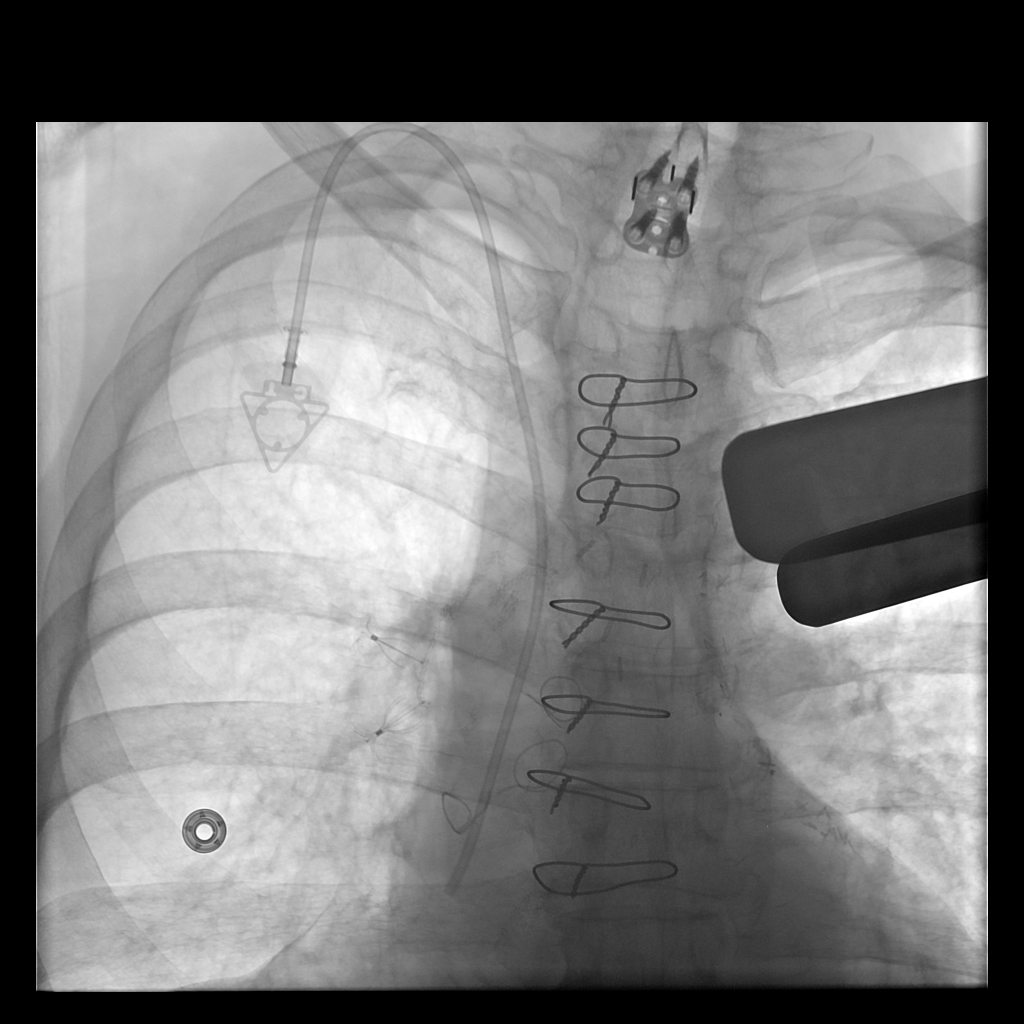

[Series 300: ir imaging guided port insertion · 1 of 1 slices shown]
[im 1/1]
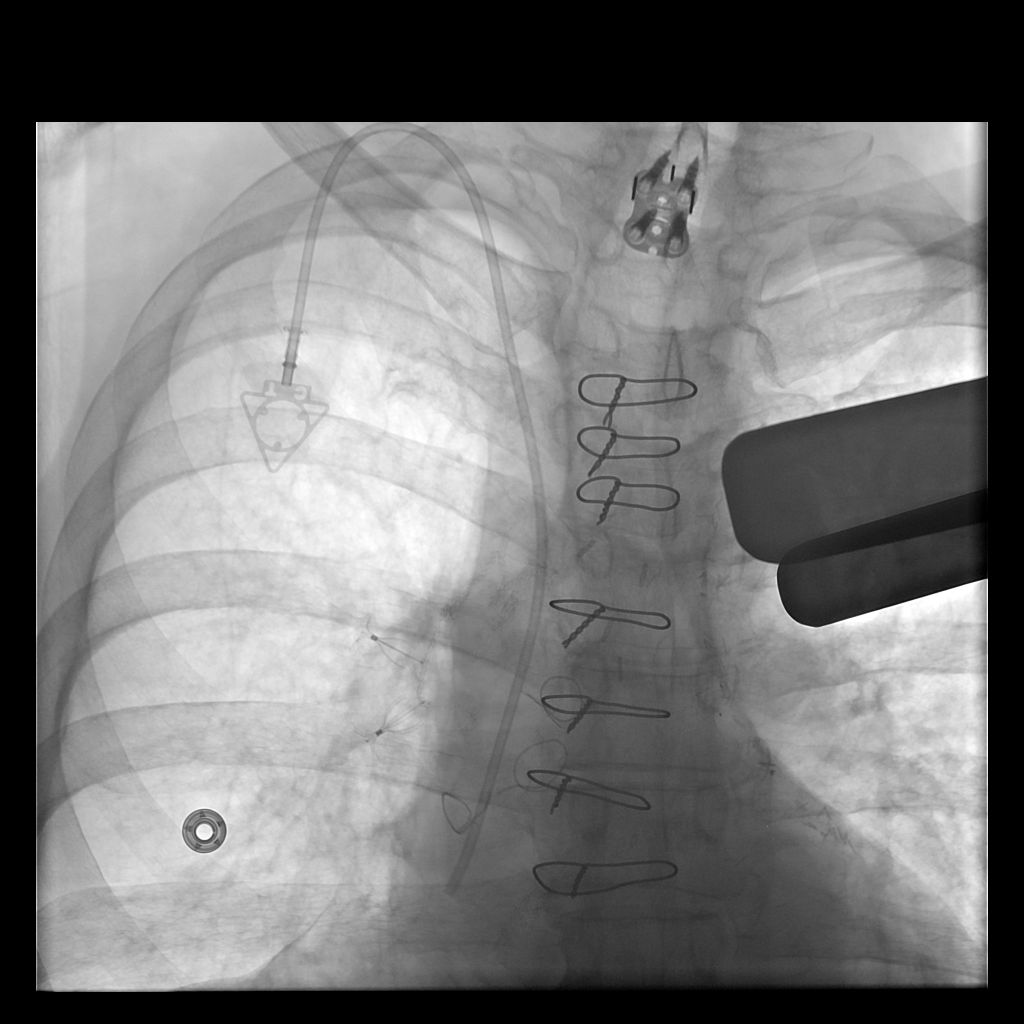

[2 of 2 positions shown; findings below may reference images not displayed]

EXAM:
IMPLANTED PORT A CATH PLACEMENT WITH ULTRASOUND AND FLUOROSCOPIC
GUIDANCE

MEDICATIONS:
None.

ANESTHESIA/SEDATION:
Versed 2 mg IV; Fentanyl 100 mcg IV;

Moderate Sedation Time:  23 minutes

The patient was continuously monitored during the procedure by the
interventional radiology nurse under my direct supervision.

FLUOROSCOPY TIME:  0 minutes, 18 seconds (6 mGy)

COMPLICATIONS:
None immediate.

PROCEDURE:
The right neck and chest was prepped with chlorhexidine, and draped
in the usual sterile fashion using maximum barrier technique (cap
and mask, sterile gown, sterile gloves, large sterile sheet, hand
hygiene and cutaneous antiseptic). Local anesthesia was attained by
infiltration with 1% lidocaine with epinephrine.

Ultrasound demonstrated patency of the right internal jugular vein,
and this was documented with an image. Under real-time ultrasound
guidance, this vein was accessed with a 21 gauge micropuncture
needle and image documentation was performed. A small dermatotomy
was made at the access site with an 11 scalpel. A 0.018" wire was
advanced into the SVC and the access needle exchanged for a 4F
micropuncture vascular sheath. The 0.018" wire was then removed and
a 0.035" wire advanced into the IVC.



The venous access site was then serially dilated and a peel away
vascular sheath placed over the wire. The wire was removed and the
port catheter advanced into position under fluoroscopic guidance.
The catheter tip is positioned in the superior cavoatrial junction.
This was documented with a spot image. The portacatheter was then
tested and found to flush and aspirate well. The port was flushed
with saline followed by 100 units/mL heparinized saline.

The pocket was then closed in two layers using first subdermal
inverted interrupted absorbable sutures followed by a running
subcuticular suture. The epidermis was then sealed with Dermabond.
The dermatotomy at the venous access site was also closed with
Dermabond.
IMPRESSION: Successful placement of a right IJ approach Power Port with
ultrasound and fluoroscopic guidance. The catheter is ready for use.

## 2022-12-10 IMAGING — DX DG ABDOMEN 1V
2 series · 2 of 2 positions shown · non-contrast
Comparison: 12/20/2018

CLINICAL DATA: Right flank pain

EXAM:
ABDOMEN - 1 VIEW

[abdomen kub (1 of 2)]
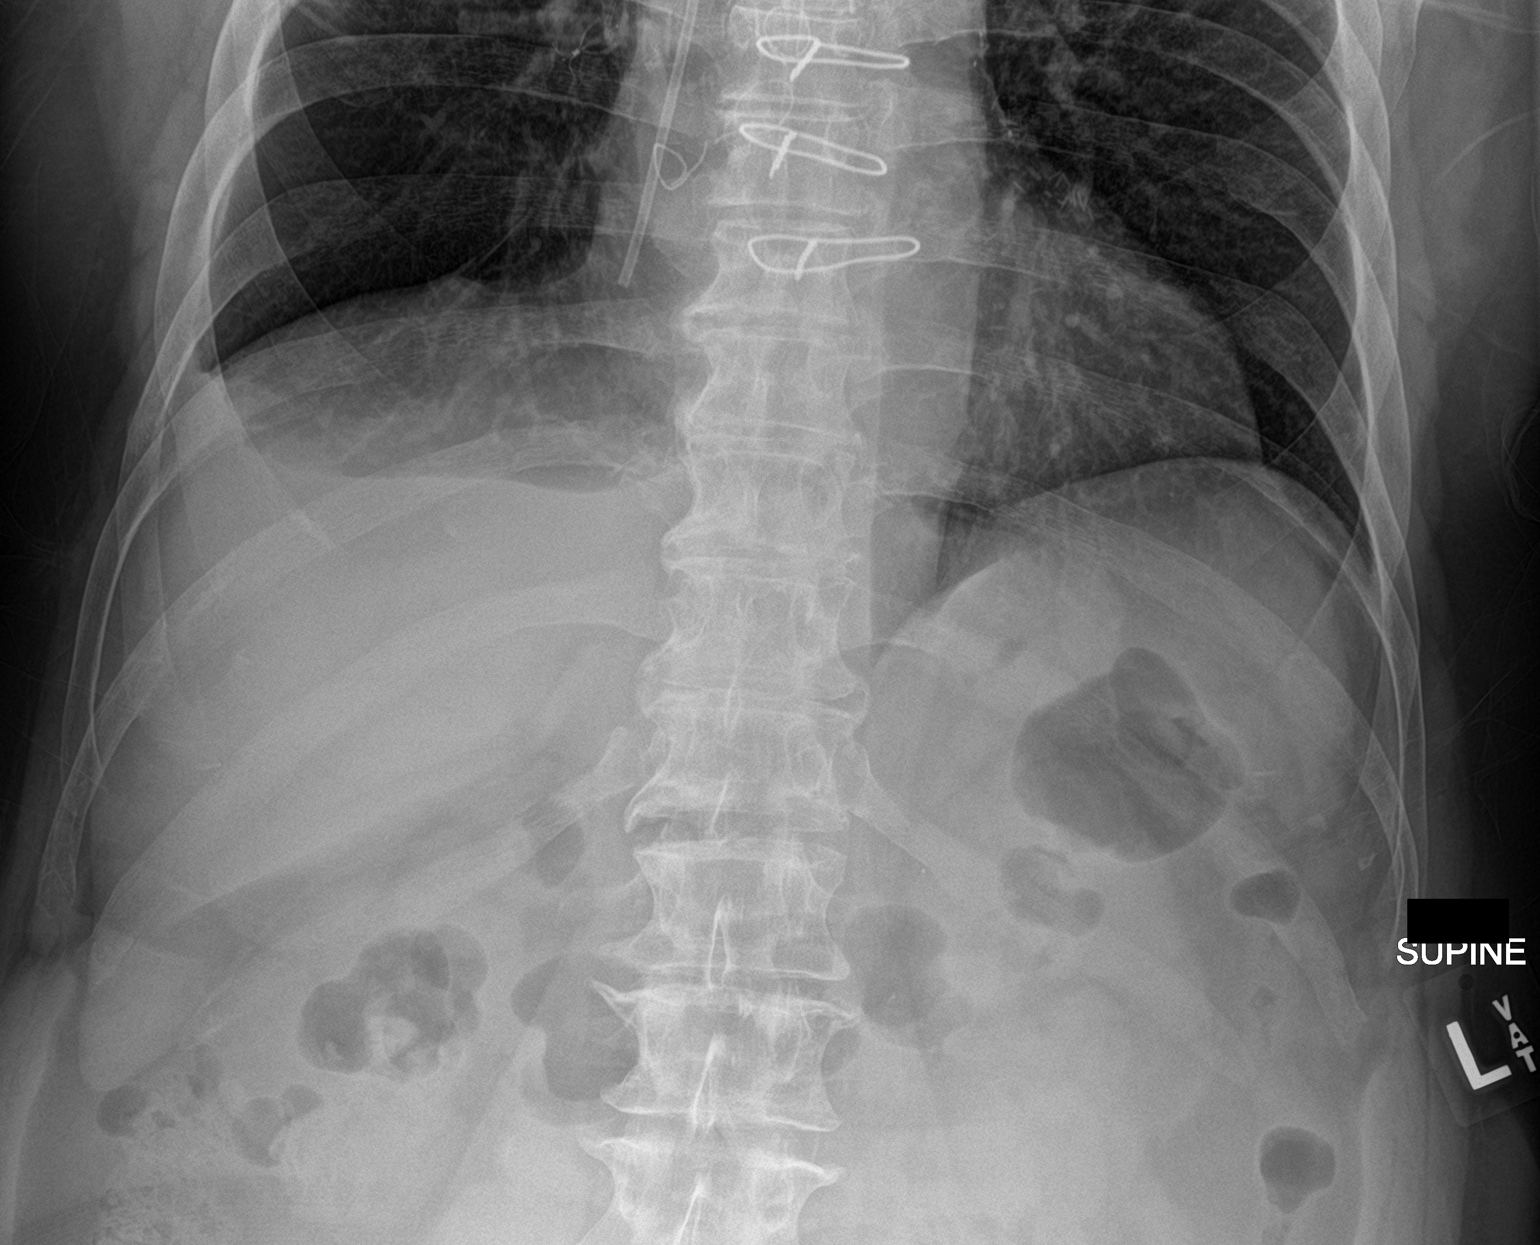

[abdomen kub (2 of 2)]
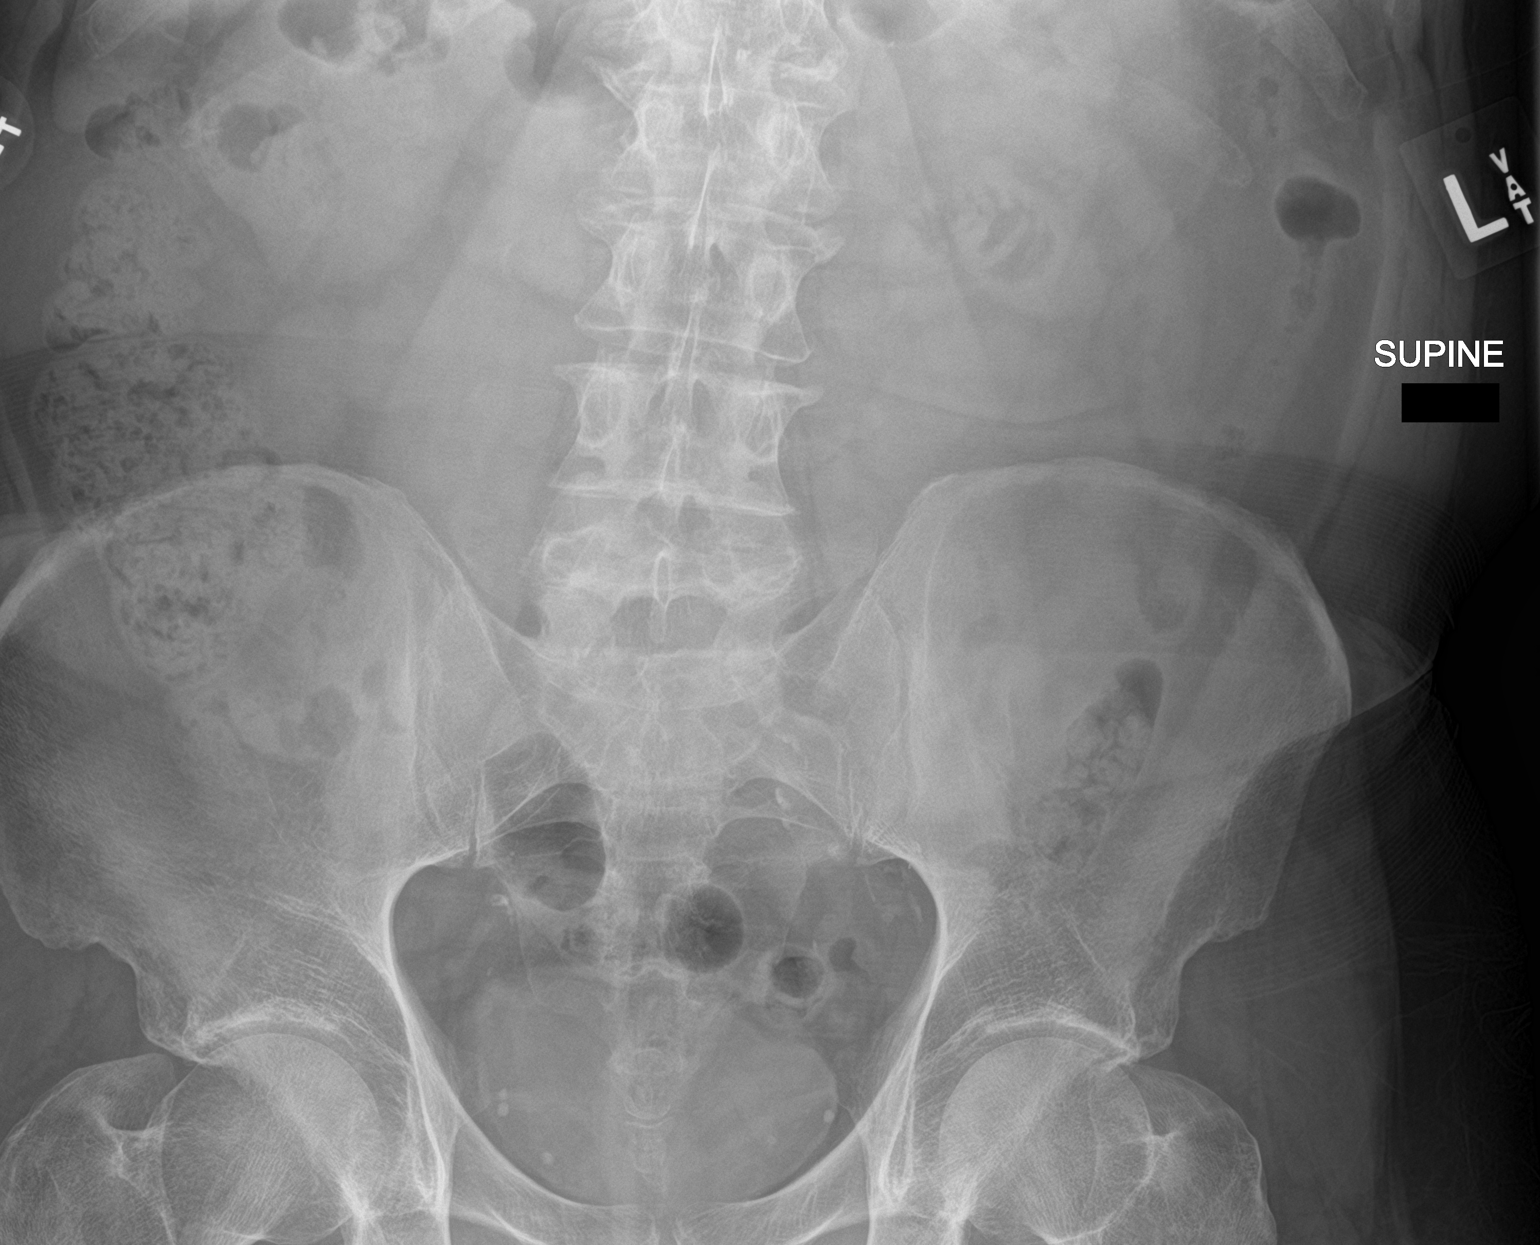

[2 of 2 positions shown; findings below may reference images not displayed]

FINDINGS: The bowel gas pattern is normal. No radio-opaque calculi or other
significant radiographic abnormality are seen. Small right pleural
effusion. Partially imaged port catheter at the cavoatrial junction.
IMPRESSION: No urinary tract calculi identified.

## 2022-12-11 IMAGING — CT CT ABD-PELV W/ CM
2 of 5 series · 16 of 46 positions shown, 18 images · IV contrast (Omnipaque)
Comparison: 11/18/2020

CLINICAL DATA: Right flank pain and right upper quadrant
tenderness. Kidney stones suspected. Recent surgery for cancer
removal. First chemotherapy yesterday.

EXAM:
CT ABDOMEN AND PELVIS WITH CONTRAST
TECHNIQUE: Multidetector CT imaging of the abdomen and pelvis was performed
using the standard protocol following bolus administration of
intravenous contrast.
CONTRAST:  100mL OMNIPAQUE IOHEXOL 300 MG/ML  SOLN

[Series 2: axial st · axial · 0.98mm/px · z∈[+676,+1111]mm · 13 of 97 slices shown, 15 images]
[im 5/97  soft-tissue]
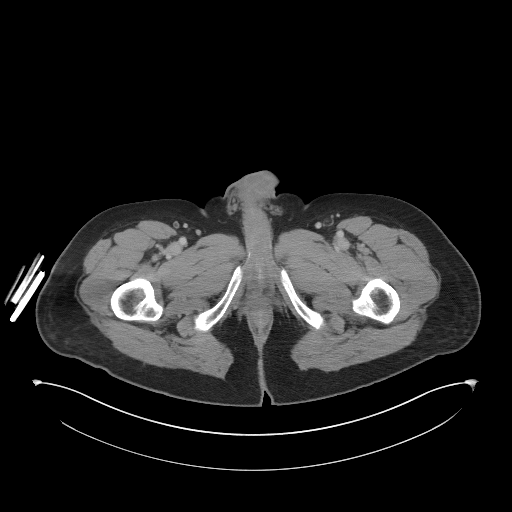
[im 5/97  bone]
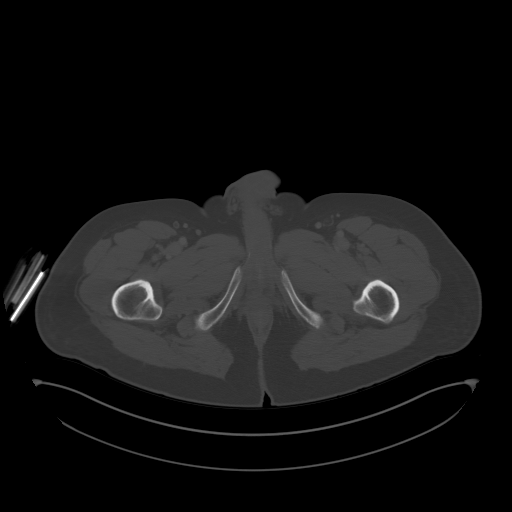
[im 15/97  soft-tissue]
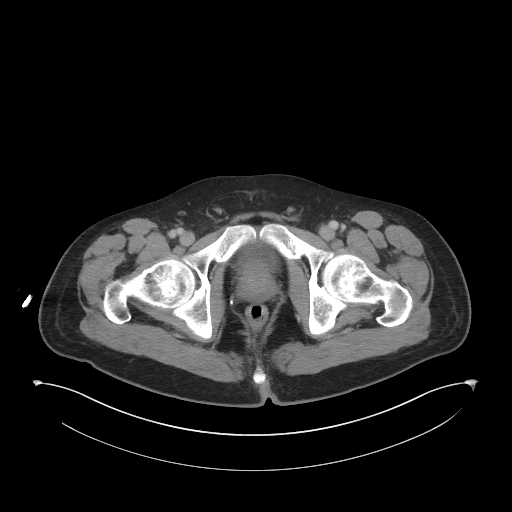
[im 20/97  soft-tissue]
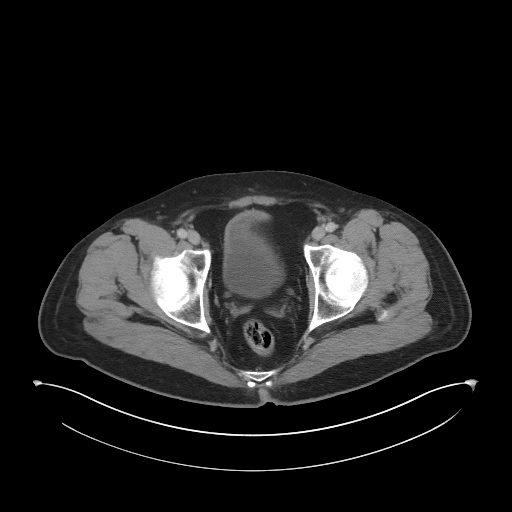
[im 29/97  soft-tissue]
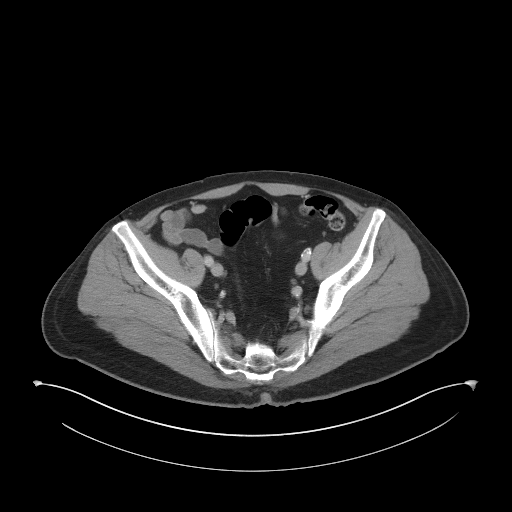
[im 34/97  soft-tissue]
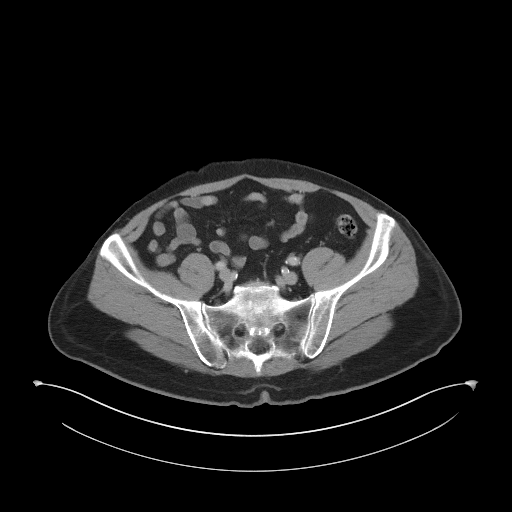
[im 44/97  soft-tissue]
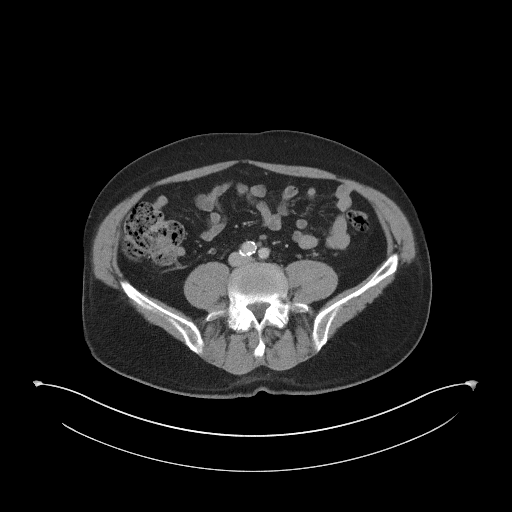
[im 49/97  soft-tissue]
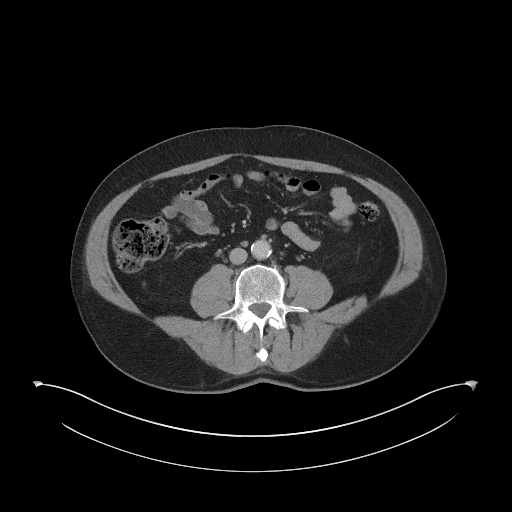
[im 53/97  soft-tissue]
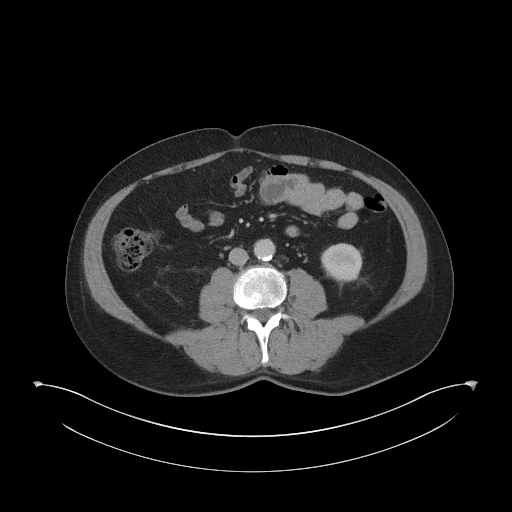
[im 63/97  soft-tissue]
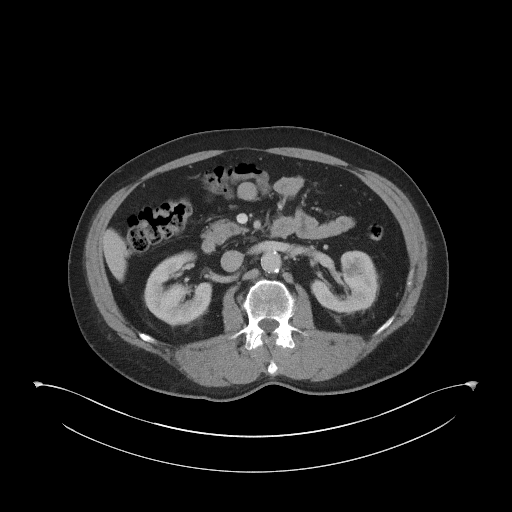
[im 63/97  bone]
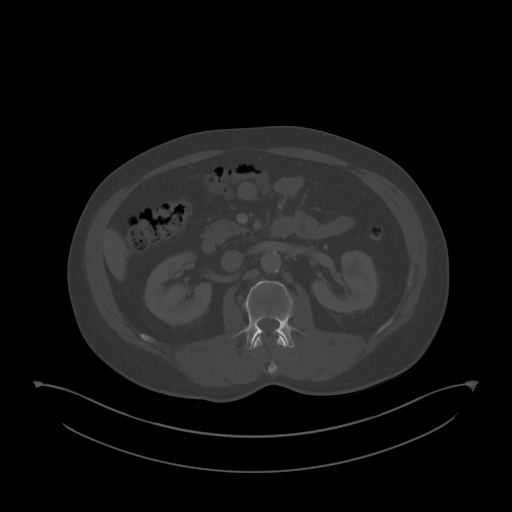
[im 68/97  soft-tissue]
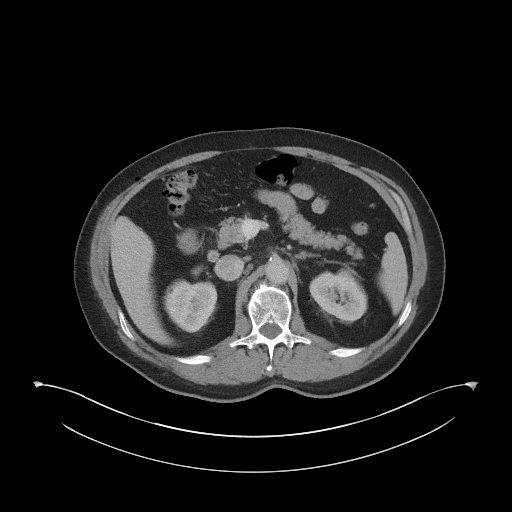
[im 77/97  soft-tissue]
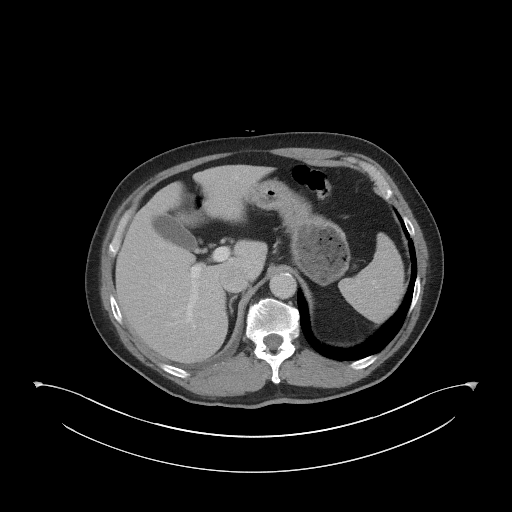
[im 82/97  soft-tissue]
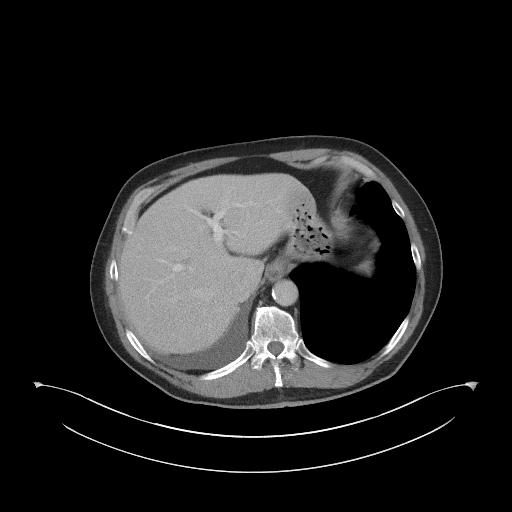
[im 92/97  soft-tissue]
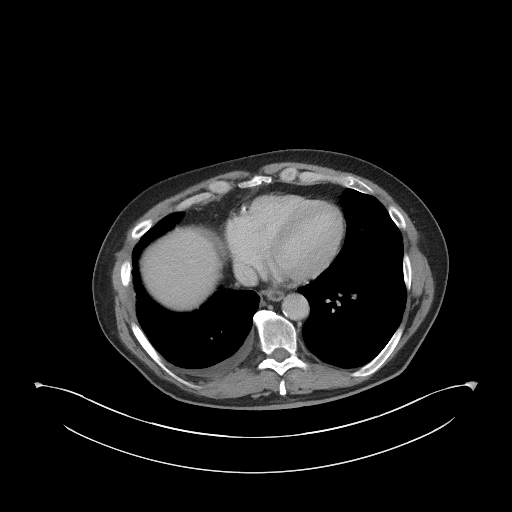

[Series 5: coronal st · coronal · 0.79mm/px · 3 of 91 slices shown]
[im 31/91  soft-tissue]
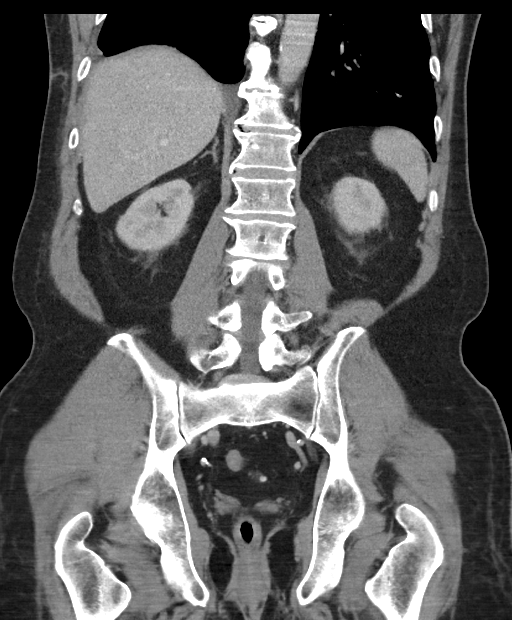
[im 41/91  soft-tissue]
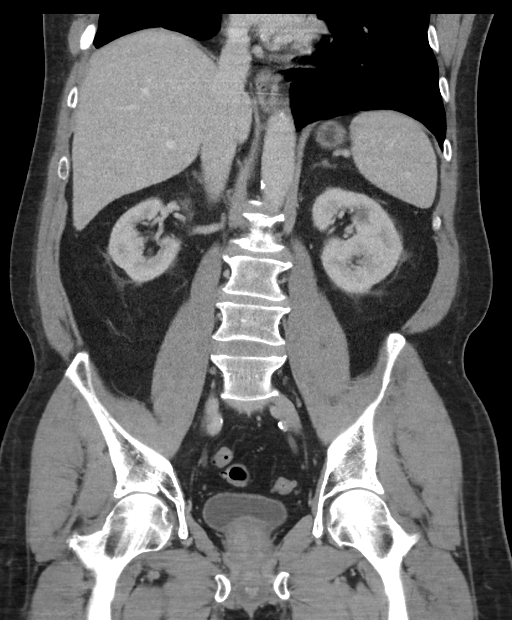
[im 51/91  soft-tissue]
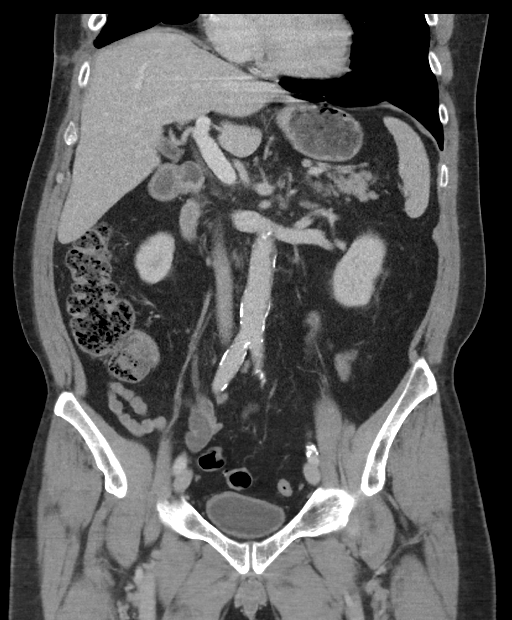

[16 of 46 positions shown; findings below may reference images not displayed]

FINDINGS: Lower chest: Small right pleural effusion.

Hepatobiliary: No focal liver abnormality is seen. No gallstones,
gallbladder wall thickening, or biliary dilatation.

Pancreas: Unremarkable. No pancreatic ductal dilatation or
surrounding inflammatory changes.

Spleen: Normal in size without focal abnormality.

Adrenals/Urinary Tract: No adrenal gland nodules. Small cyst on the
left kidney. Nephrograms are symmetrical. No hydronephrosis or
hydroureter. No renal or ureteral stones identified. Bladder is
unremarkable.

Stomach/Bowel: Stomach, small bowel, and colon are not abnormally
distended. No wall thickening or inflammatory changes are
appreciated. Appendix is normal.

Vascular/Lymphatic: Aortic atherosclerosis. No enlarged abdominal or
pelvic lymph nodes.

Reproductive: Prostate is unremarkable.

Other: No free air or free fluid in the abdomen. Small focal
subcutaneous emphysema in the anterior abdominal wall, likely
injection site. No infiltration or collection in the subcutaneous
fat to suggest postoperative complication.

Musculoskeletal: No acute or significant osseous findings.
IMPRESSION: 1. No renal or ureteral stone or obstruction.
2. No acute process demonstrated in the abdomen or pelvis.
3. Aortic atherosclerosis.
4. Small right pleural effusion.

## 2022-12-11 IMAGING — US US ABDOMEN LIMITED
1 series · 14 of 25 positions shown · non-contrast
Comparison: 10/24/2020

CLINICAL DATA: Right upper quadrant pain

EXAM:
ULTRASOUND ABDOMEN LIMITED RIGHT UPPER QUADRANT

[Series 1: us abdomen limited · 14 of 58 slices shown]
[im 1/58]
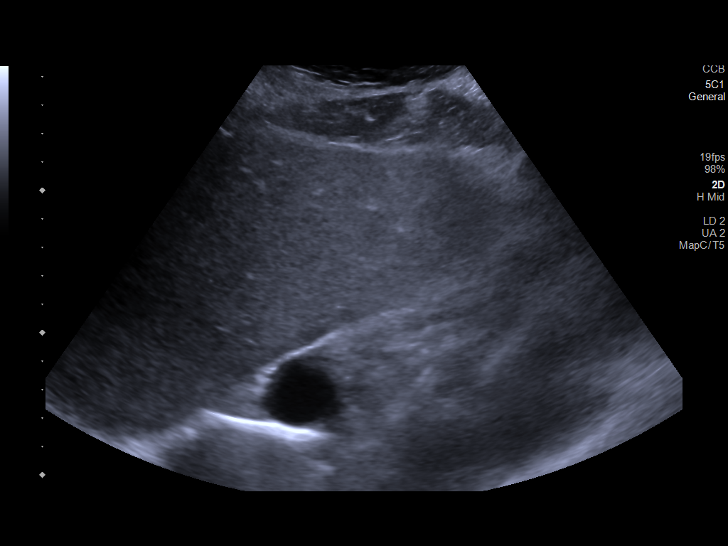
[im 5/58]
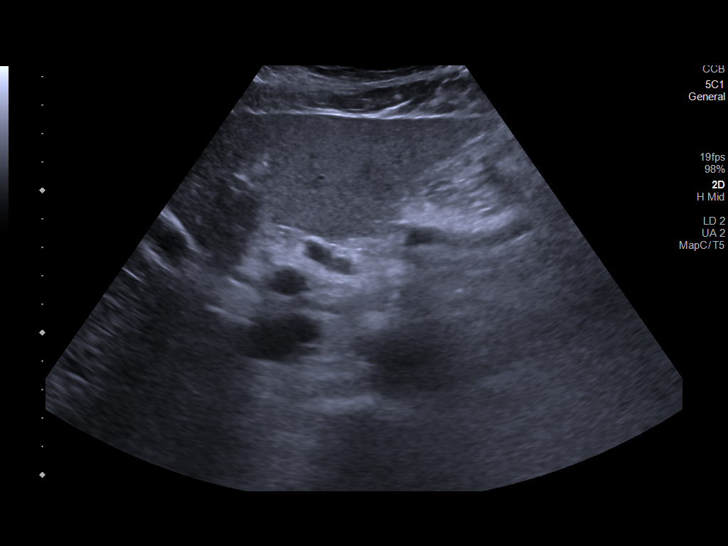
[im 10/58]
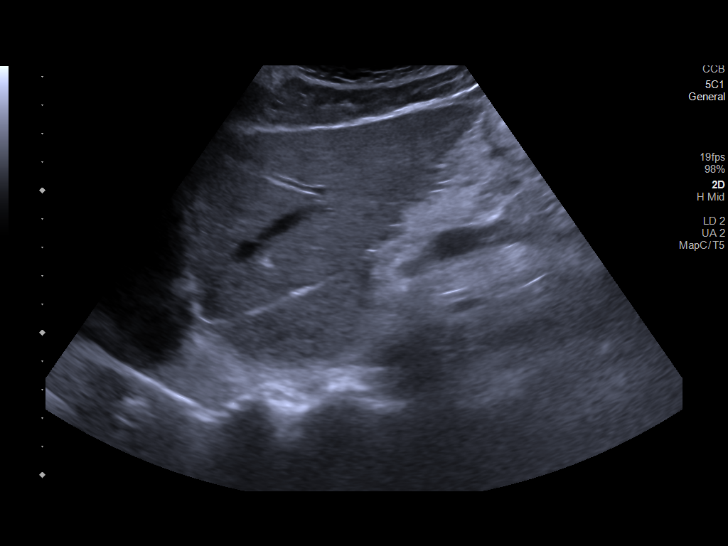
[im 15/58]
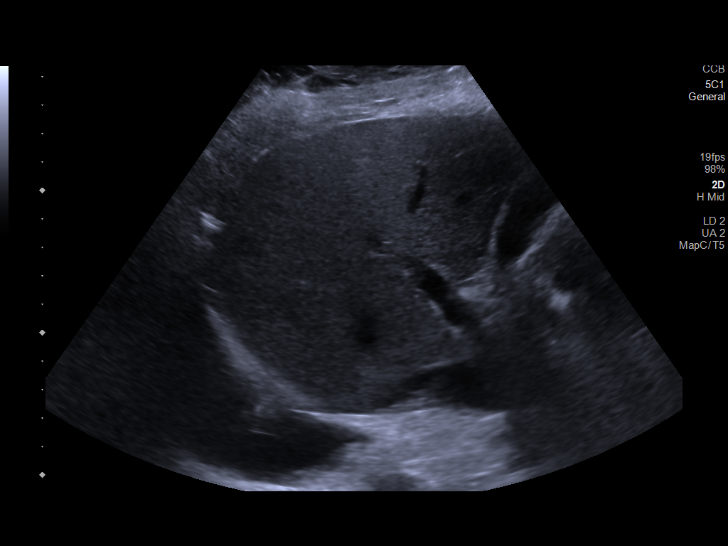
[im 20/58]
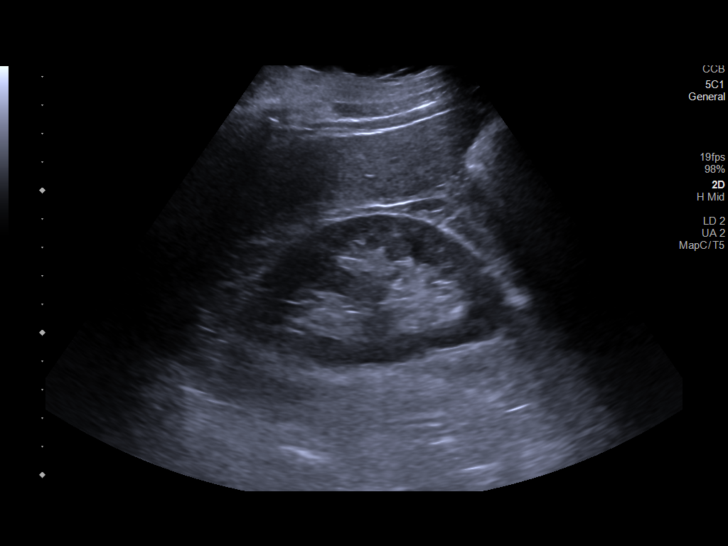
[im 22/58]
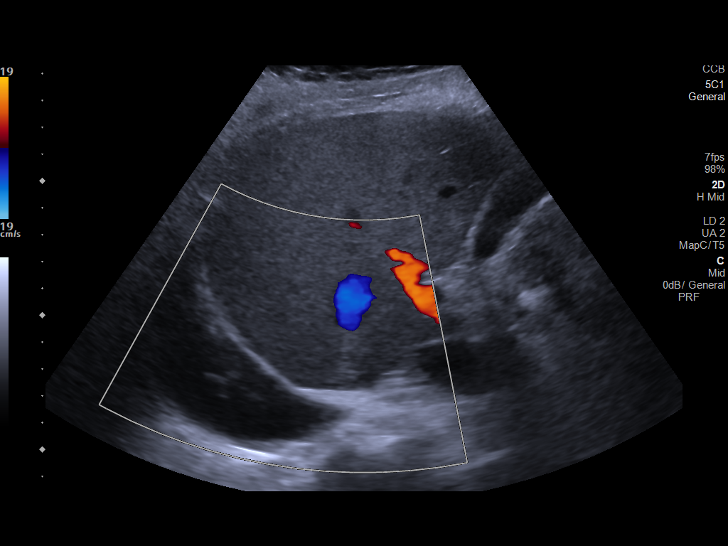
[im 27/58]
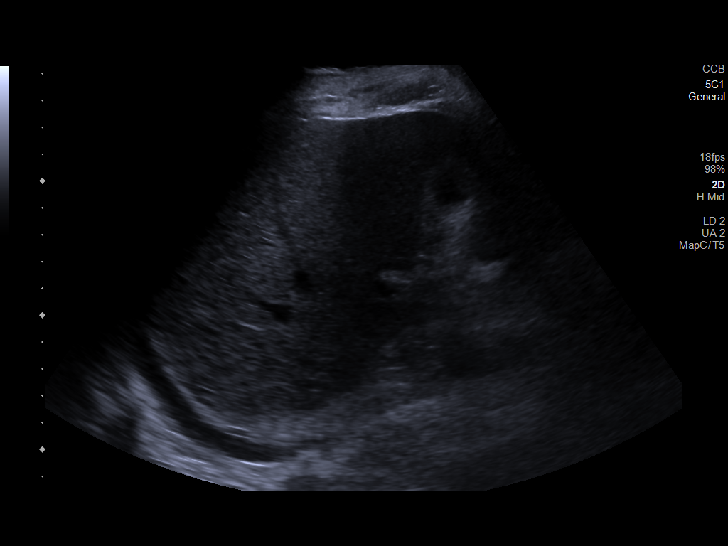
[im 31/58]
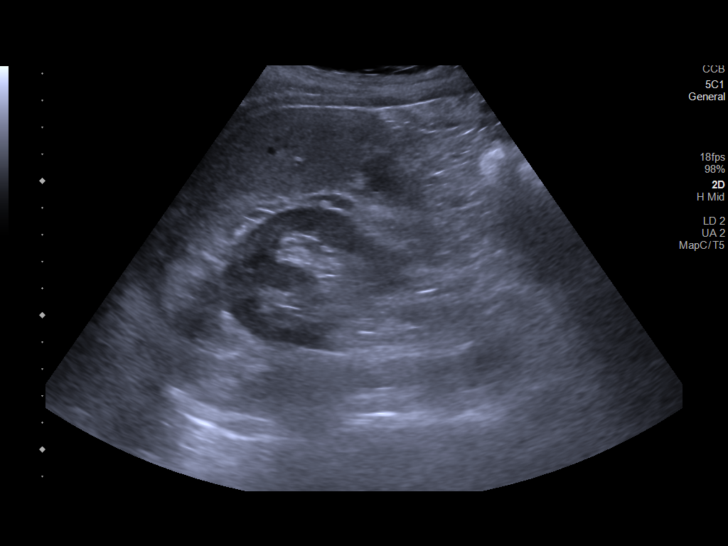
[im 36/58]
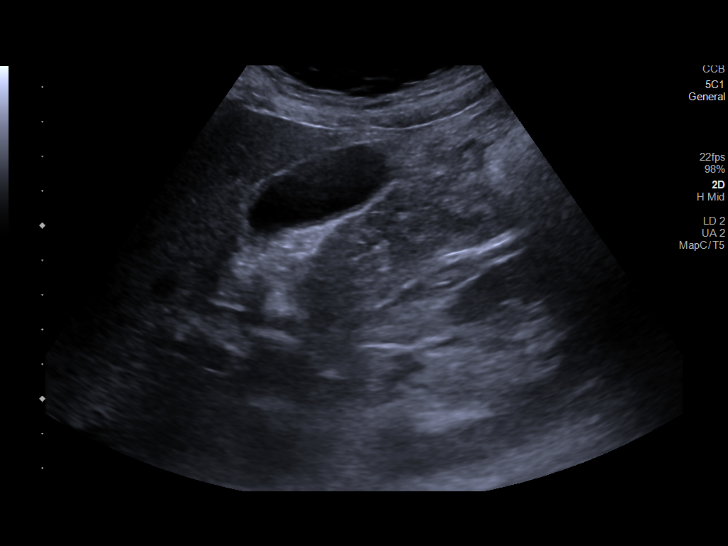
[im 39/58]
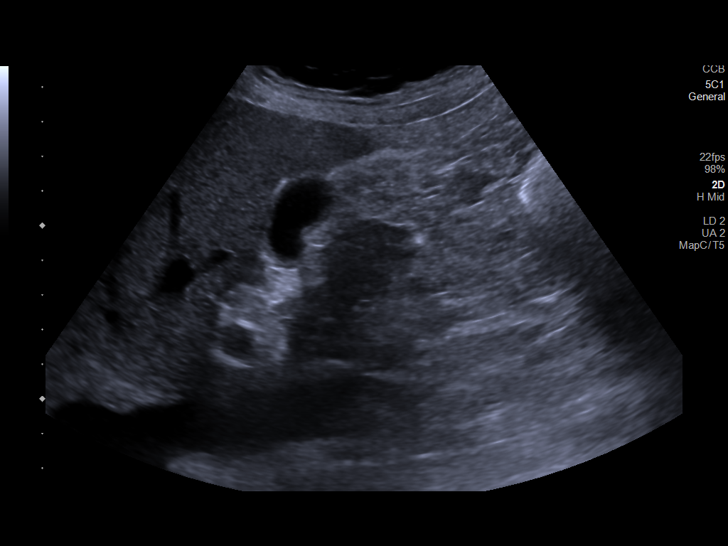
[im 43/58]
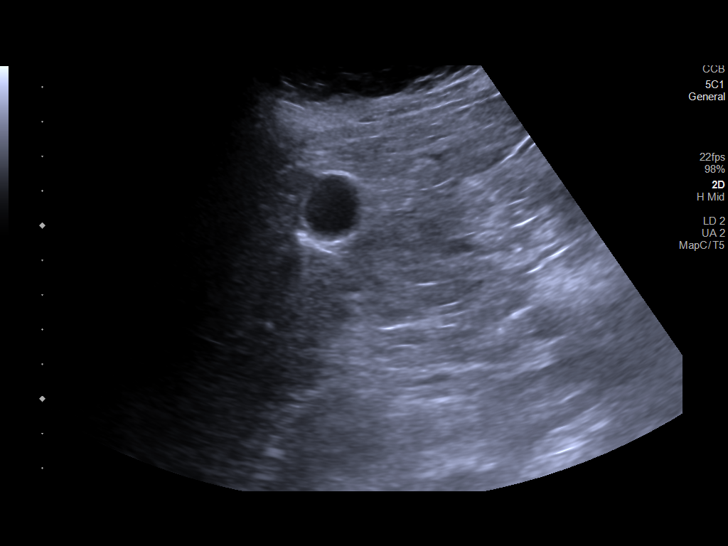
[im 48/58]
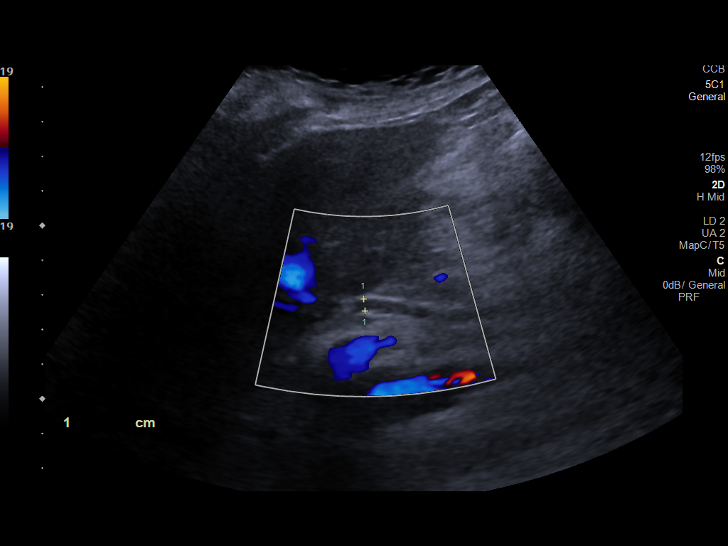
[im 53/58]
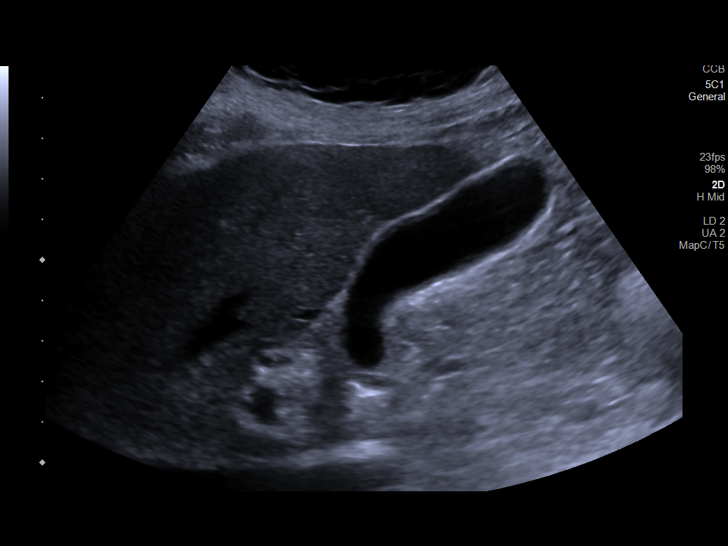
[im 58/58]
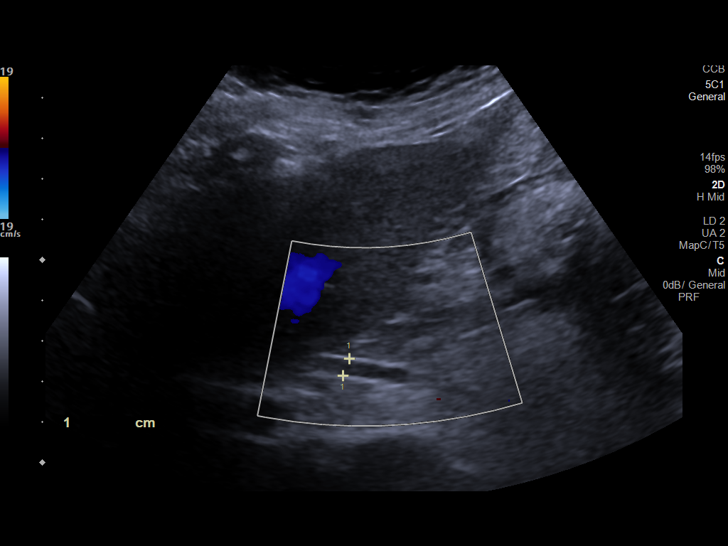

[14 of 25 positions shown; findings below may reference images not displayed]

FINDINGS: Gallbladder:

No gallstones or wall thickening visualized. No sonographic Murphy
sign noted by sonographer.

Common bile duct:

Diameter: Normal caliber, 4 mm

Liver:

No focal lesion identified. Within normal limits in parenchymal
echogenicity. Portal vein is patent on color Doppler imaging with
normal direction of blood flow towards the liver.

Other: None.
IMPRESSION: No acute findings.

## 2022-12-14 IMAGING — CT CT ABD-PELV W/ CM
2 of 5 series · 15 of 46 positions shown, 17 images · IV contrast (Omnipaque)
Comparison: CT chest 12/12/2020.  CT abdomen and pelvis 02/11/2021

CLINICAL DATA: Recent surgery. Cancer. Right flank pain.
Tachycardia. Pulmonary embolus suspected with high probability.
Recent diagnosis of UTI without improvement. History of lung cancer
metastatic to brain.

EXAM:
CT ANGIOGRAPHY CHEST
CT ABDOMEN AND PELVIS WITH CONTRAST
TECHNIQUE: Multidetector CT imaging of the chest was performed using the
standard protocol during bolus administration of intravenous
contrast. Multiplanar CT image reconstructions and MIPs were
obtained to evaluate the vascular anatomy. Multidetector CT imaging
of the abdomen and pelvis was performed using the standard protocol
during bolus administration of intravenous contrast.
CONTRAST:  100mL OMNIPAQUE IOHEXOL 350 MG/ML SOLN

[Series 2: axial st · axial · 0.95mm/px · z∈[-701,-236]mm · 12 of 105 slices shown, 14 images]
[im 6/105  soft-tissue]
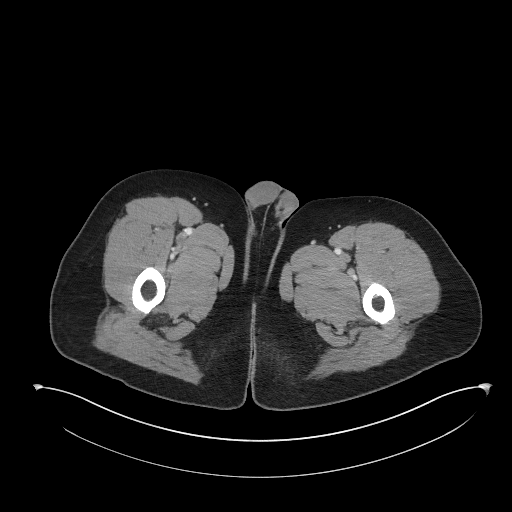
[im 6/105  bone]
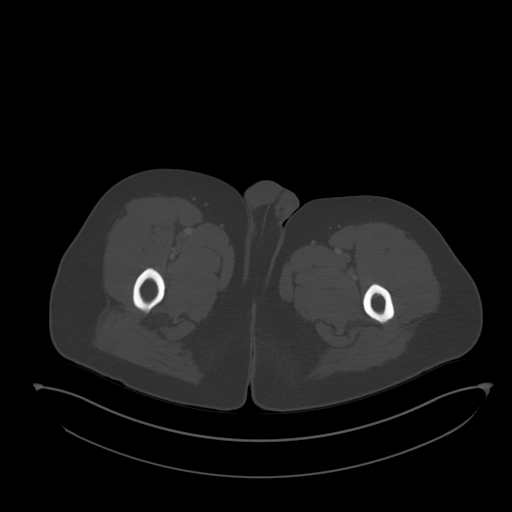
[im 16/105  soft-tissue]
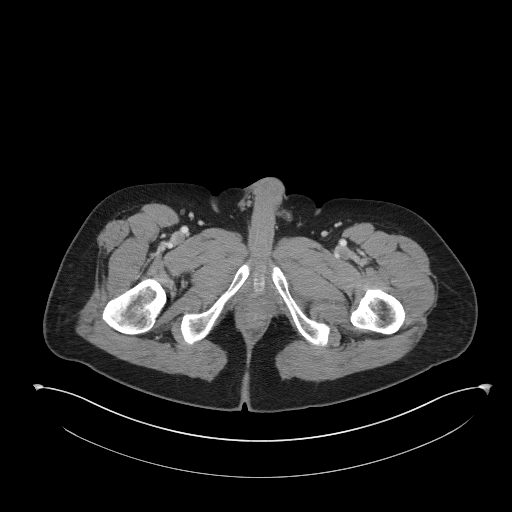
[im 21/105  soft-tissue]
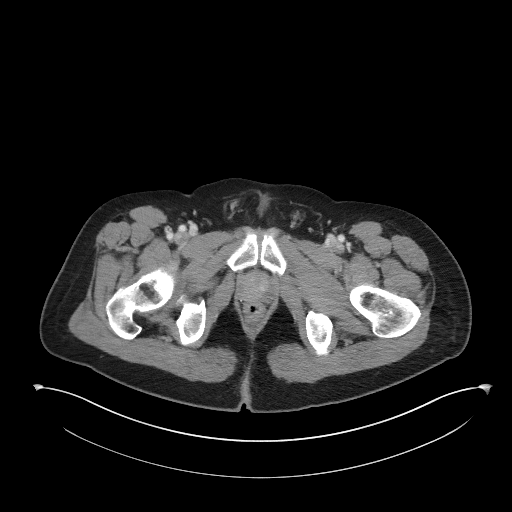
[im 32/105  soft-tissue]
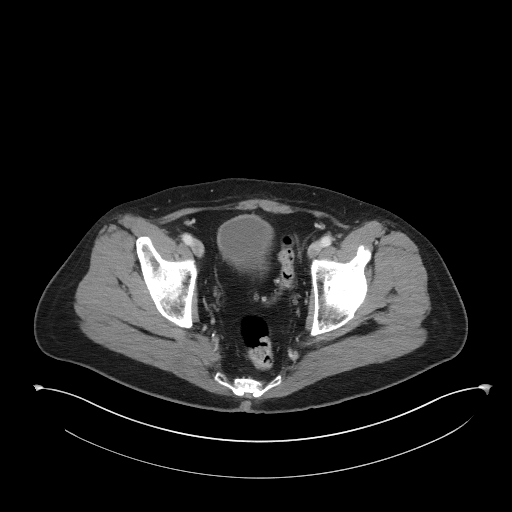
[im 42/105  soft-tissue]
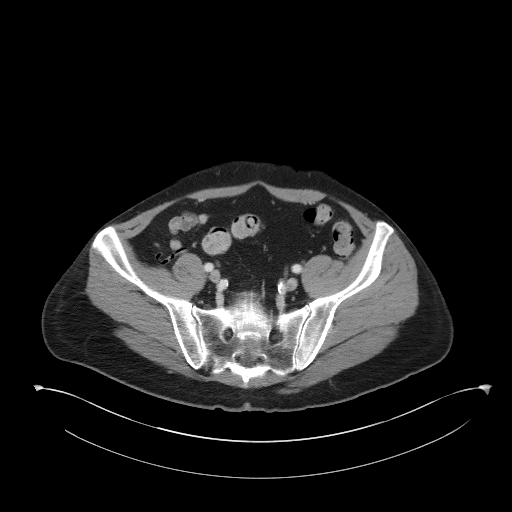
[im 47/105  soft-tissue]
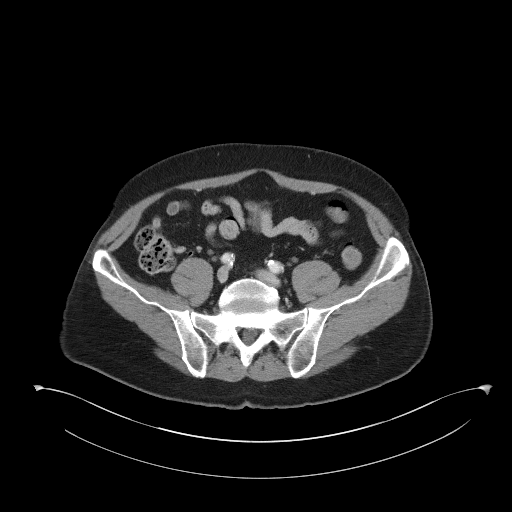
[im 58/105  soft-tissue]
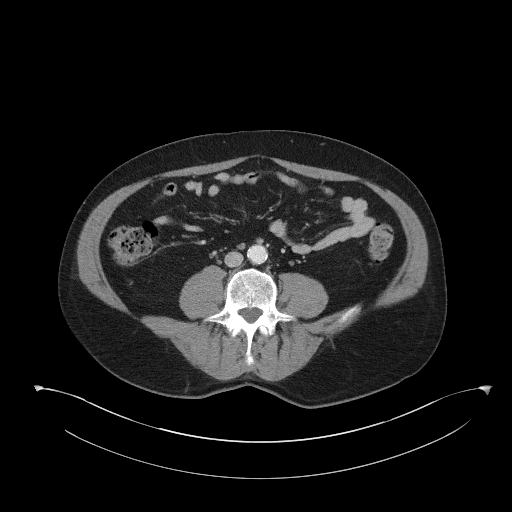
[im 63/105  soft-tissue]
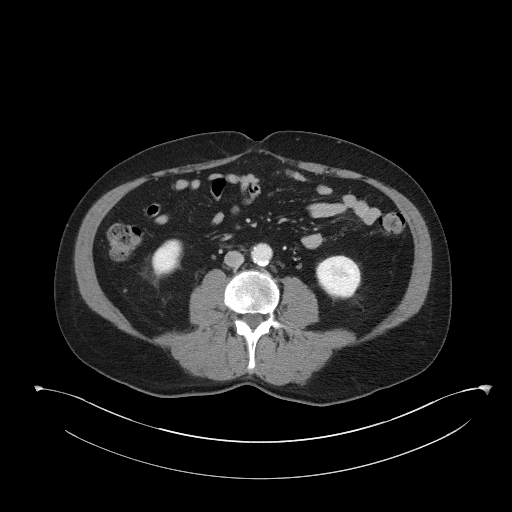
[im 73/105  soft-tissue]
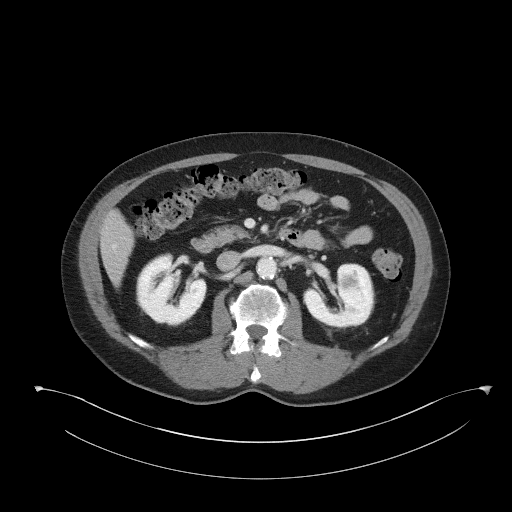
[im 73/105  bone]
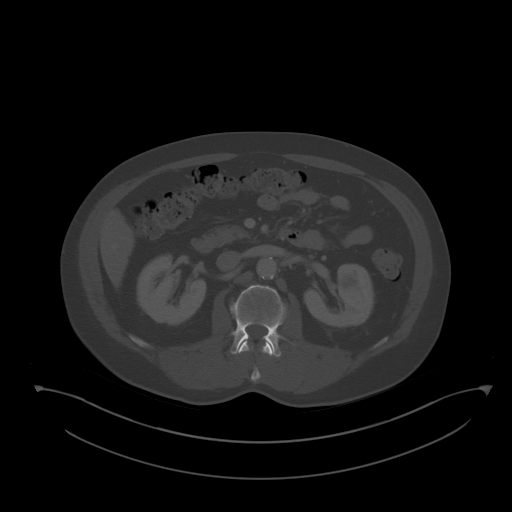
[im 84/105  soft-tissue]
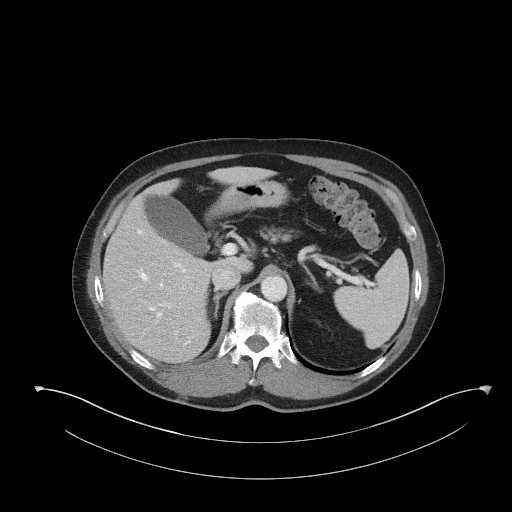
[im 89/105  soft-tissue]
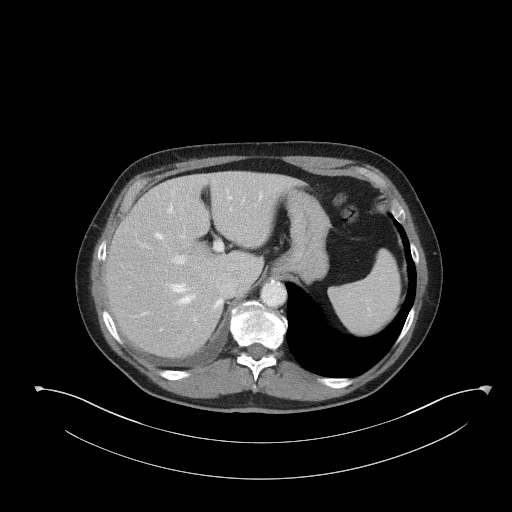
[im 99/105  soft-tissue]
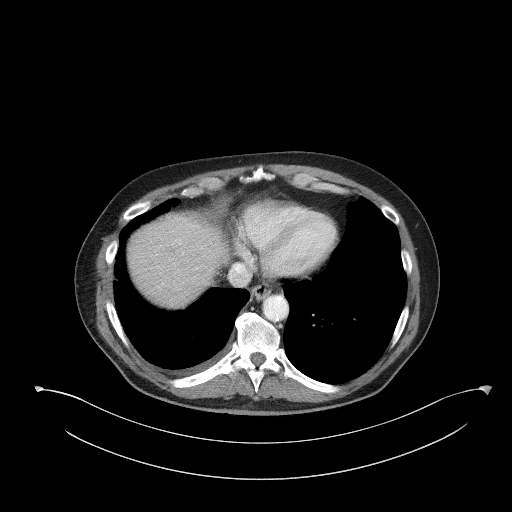

[Series 5: abd/pel coronal st · coronal · 0.82mm/px · 3 of 89 slices shown]
[im 30/89  soft-tissue]
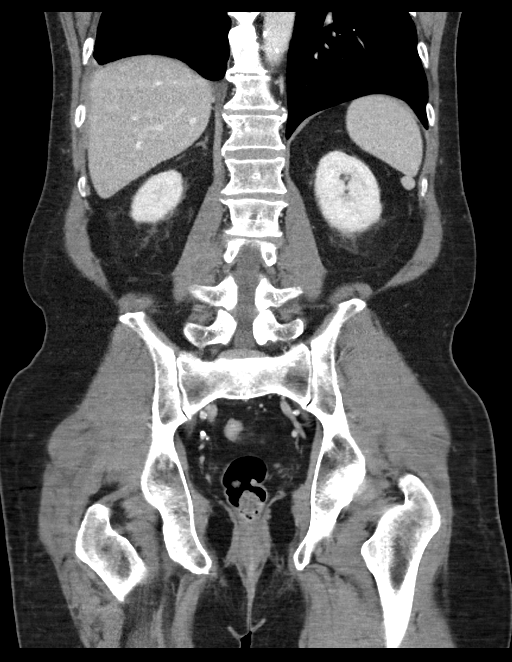
[im 40/89  soft-tissue]
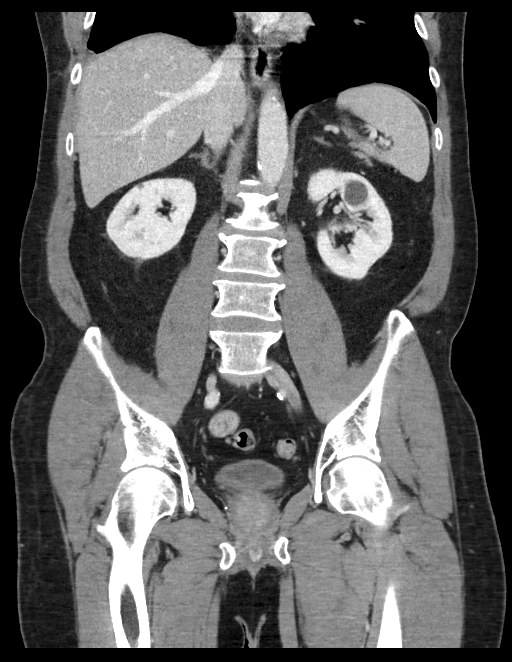
[im 49/89  soft-tissue]
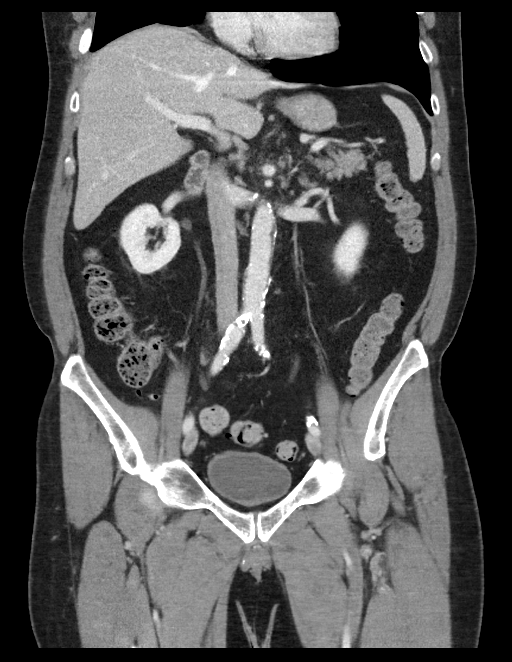

[15 of 46 positions shown; findings below may reference images not displayed]

FINDINGS: CTA CHEST FINDINGS

Cardiovascular: There is good opacification of the central and
segmental pulmonary arteries. No focal filling defects. No evidence
of significant pulmonary embolus. Ascending aortic aneurysm
measuring 4 cm in diameter, unchanged. No aortic dissection. Great
vessel origins are patent. Coronary artery and aortic calcification.
Postoperative changes consistent with coronary bypass. Normal heart
size. No pericardial effusions.

Mediastinum/Nodes: Esophagus is decompressed. No significant
lymphadenopathy in the chest. Scattered lymph nodes are not
pathologically enlarged.

Lungs/Pleura: Small right pleural effusion. Mild patchy areas of
airspace disease in the right lung could represent early multifocal
pneumonia. Left lung is clear. Previous right upper lung nodule has
been resected in the interval.

Musculoskeletal: Degenerative changes in the spine. Postoperative
changes in the lower cervical spine. Sternotomy wires. Old rib
fractures.

Review of the MIP images confirms the above findings.

CT ABDOMEN and PELVIS FINDINGS

Hepatobiliary: Subcentimeter lesion in the dome of the liver likely
representing a cavernous hemangioma. Gallbladder and bile ducts are
unremarkable.

Pancreas: Unremarkable. No pancreatic ductal dilatation or
surrounding inflammatory changes.

Spleen: Normal in size without focal abnormality.

Adrenals/Urinary Tract: No adrenal gland nodules. Cyst in the upper
pole left kidney. Nephrograms are symmetrical and otherwise
homogeneous. No hydronephrosis or hydroureter. Bladder is
unremarkable.

Stomach/Bowel: Stomach, small bowel, and colon are not abnormally
distended. No wall thickening or inflammatory changes are apparent.
Scattered diverticula in the sigmoid colon without evidence of
diverticulitis. Appendix is normal.

Vascular/Lymphatic: Aortic atherosclerosis. No enlarged abdominal or
pelvic lymph nodes.

Reproductive: Prostate is unremarkable.

Other: No free air or free fluid in the abdomen. Abdominal wall
musculature appears intact.

Musculoskeletal: Degenerative changes in the spine. No destructive
bone lesions.

Review of the MIP images confirms the above findings.
IMPRESSION: 1. No evidence of significant pulmonary embolus.
2. Small right pleural effusion. Patchy infiltrates in the right
lung are nonspecific but could indicate early multifocal pneumonia.
3. No acute process demonstrated in the abdomen or pelvis.
4. Aortic atherosclerosis.

## 2022-12-14 IMAGING — CT CT ANGIO CHEST
2 of 9 series · 17 of 36 positions shown · IV contrast (Omnipaque)
Comparison: CT chest 12/12/2020.  CT abdomen and pelvis 02/11/2021

CLINICAL DATA: Recent surgery. Cancer. Right flank pain.
Tachycardia. Pulmonary embolus suspected with high probability.
Recent diagnosis of UTI without improvement. History of lung cancer
metastatic to brain.

EXAM:
CT ANGIOGRAPHY CHEST
CT ABDOMEN AND PELVIS WITH CONTRAST
TECHNIQUE: Multidetector CT imaging of the chest was performed using the
standard protocol during bolus administration of intravenous
contrast. Multiplanar CT image reconstructions and MIPs were
obtained to evaluate the vascular anatomy. Multidetector CT imaging
of the abdomen and pelvis was performed using the standard protocol
during bolus administration of intravenous contrast.
CONTRAST:  100mL OMNIPAQUE IOHEXOL 350 MG/ML SOLN

[Series 7: pe coronal mpr · coronal · 0.60mm/px · 1 of 127 slices shown]
[im 64/127  mediastinal]
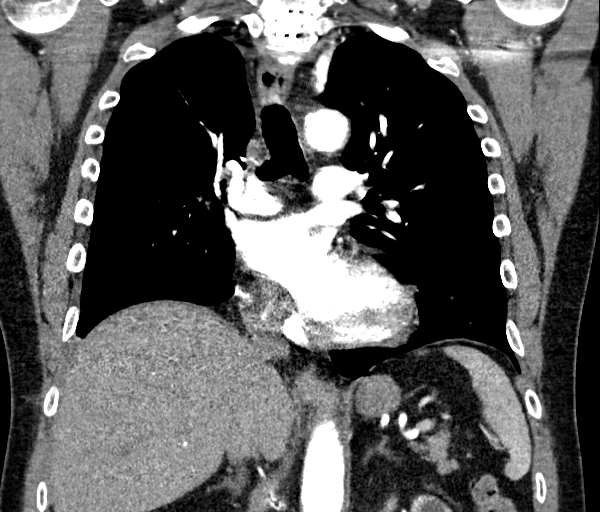

[Series 11: pe thins · axial · 0.75mm/px · z∈[-311,-34]mm · 16 of 311 slices shown]
[im 17/311  lung]
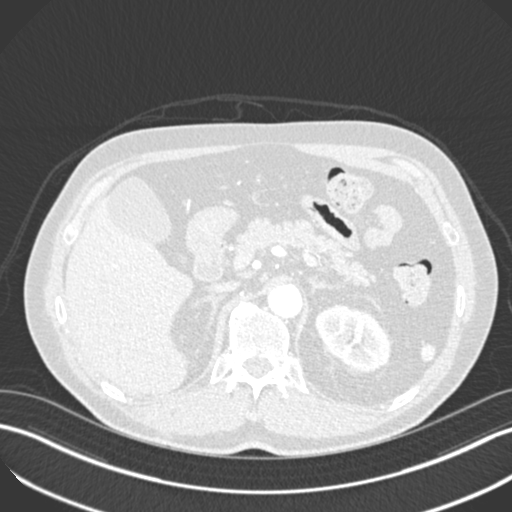
[im 33/311  mediastinal]
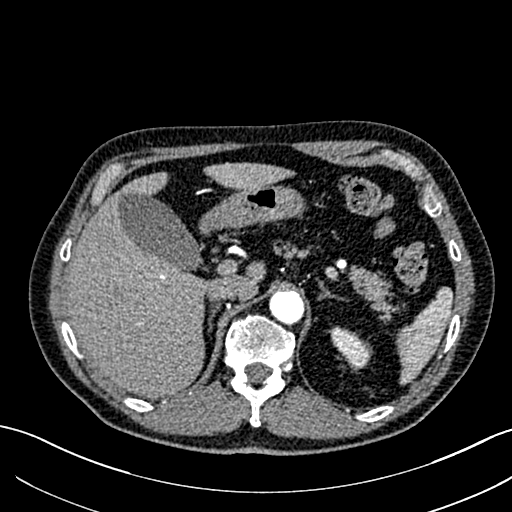
[im 49/311  lung]
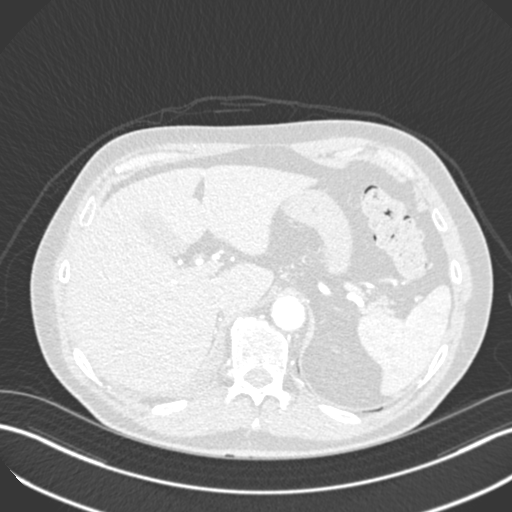
[im 66/311  mediastinal]
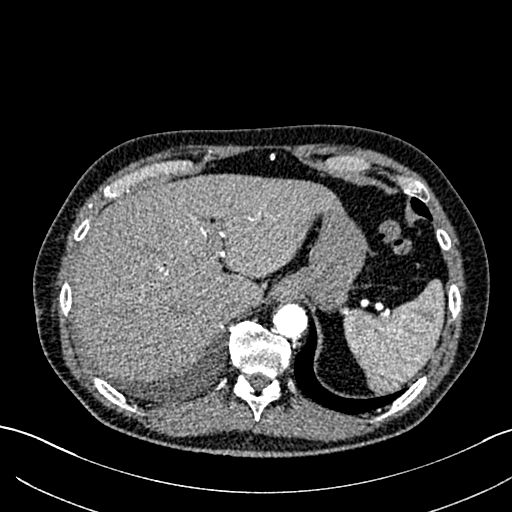
[im 98/311  lung]
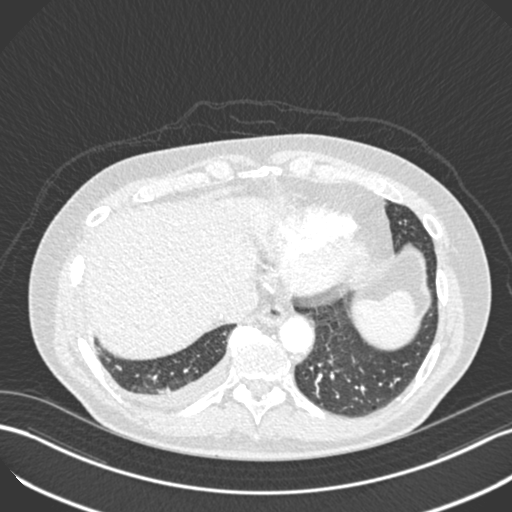
[im 115/311  mediastinal]
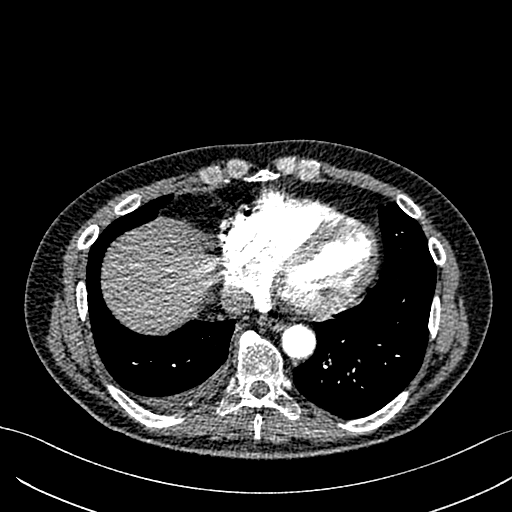
[im 131/311  lung]
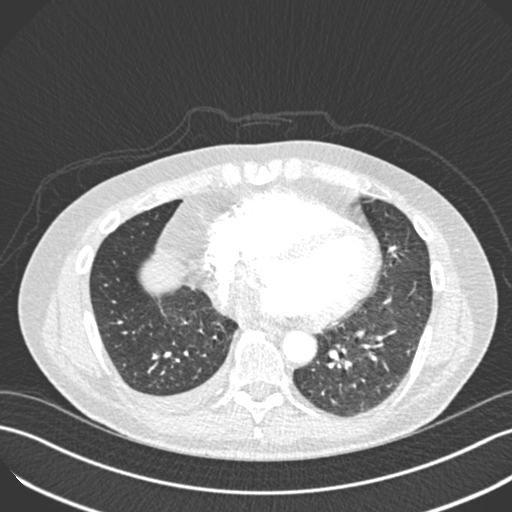
[im 147/311  mediastinal]
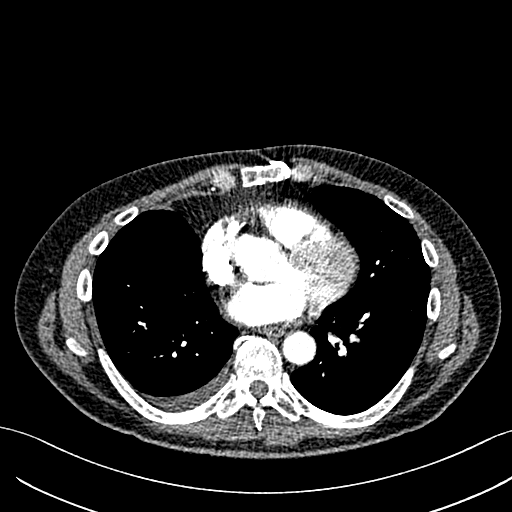
[im 164/311  lung]
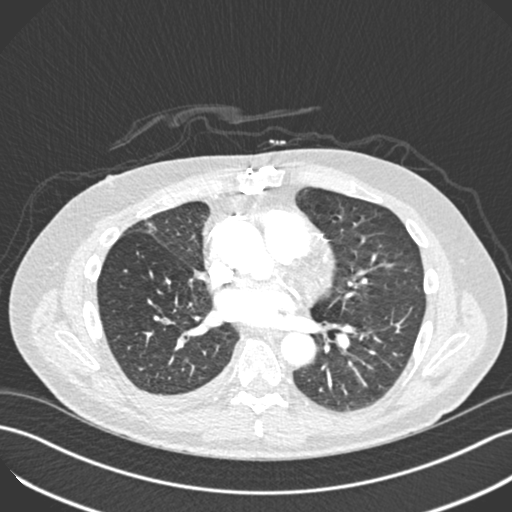
[im 180/311  mediastinal]
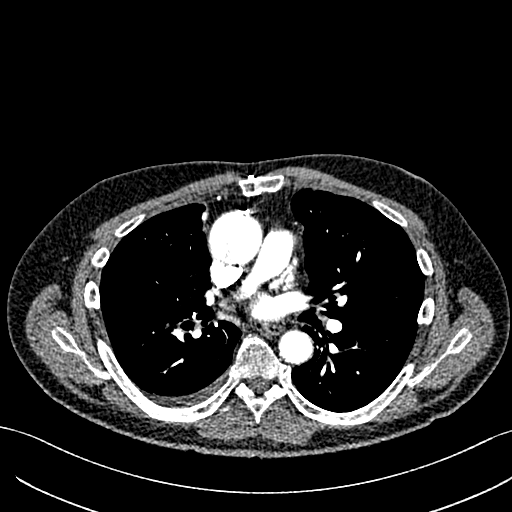
[im 196/311  lung]
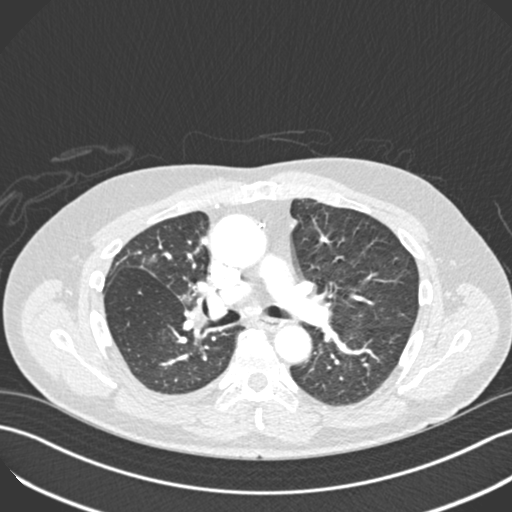
[im 213/311  mediastinal]
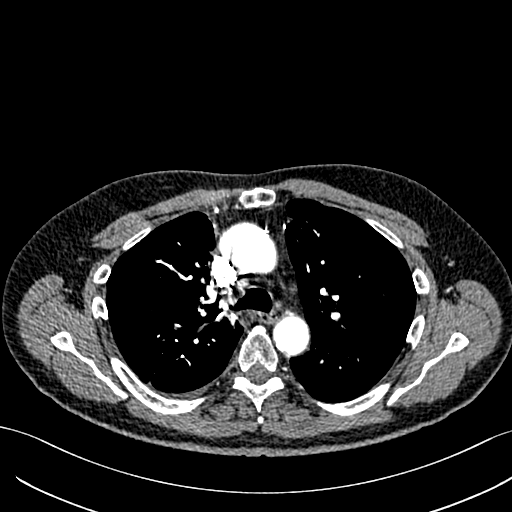
[im 245/311  lung]
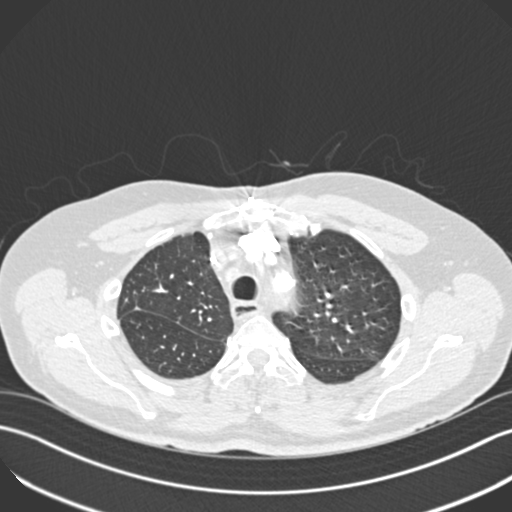
[im 262/311  mediastinal]
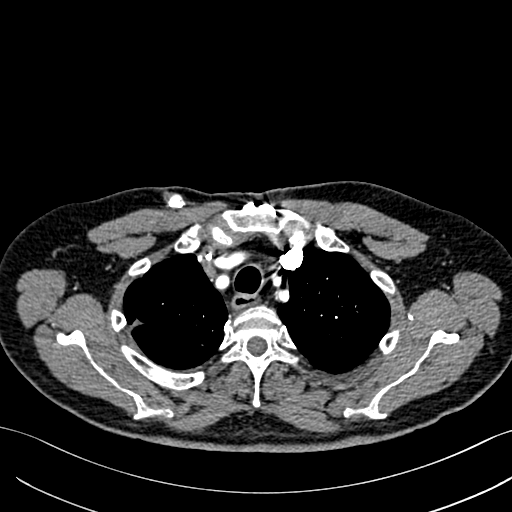
[im 278/311  lung]
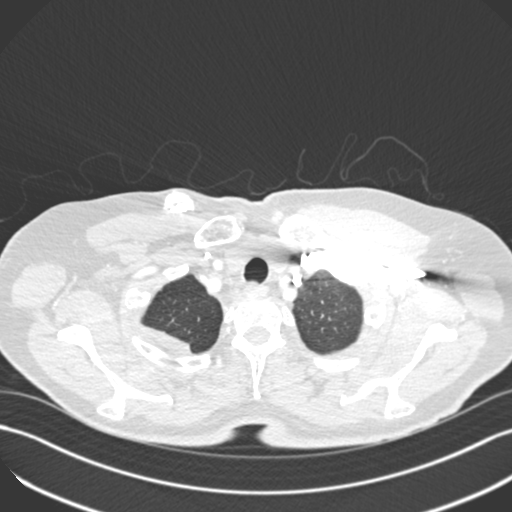
[im 294/311  mediastinal]
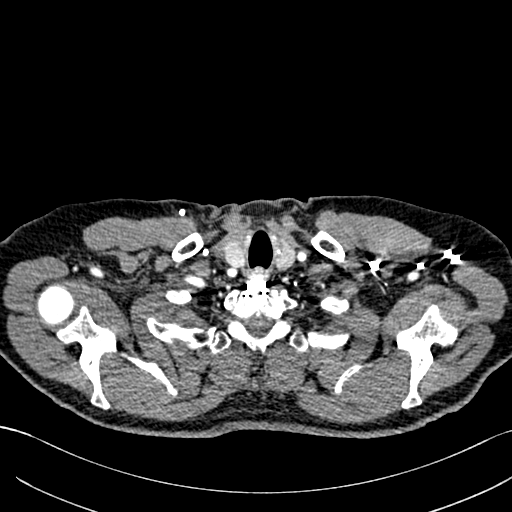

[17 of 36 positions shown; findings below may reference images not displayed]

FINDINGS: CTA CHEST FINDINGS

Cardiovascular: There is good opacification of the central and
segmental pulmonary arteries. No focal filling defects. No evidence
of significant pulmonary embolus. Ascending aortic aneurysm
measuring 4 cm in diameter, unchanged. No aortic dissection. Great
vessel origins are patent. Coronary artery and aortic calcification.
Postoperative changes consistent with coronary bypass. Normal heart
size. No pericardial effusions.

Mediastinum/Nodes: Esophagus is decompressed. No significant
lymphadenopathy in the chest. Scattered lymph nodes are not
pathologically enlarged.

Lungs/Pleura: Small right pleural effusion. Mild patchy areas of
airspace disease in the right lung could represent early multifocal
pneumonia. Left lung is clear. Previous right upper lung nodule has
been resected in the interval.

Musculoskeletal: Degenerative changes in the spine. Postoperative
changes in the lower cervical spine. Sternotomy wires. Old rib
fractures.

Review of the MIP images confirms the above findings.

CT ABDOMEN and PELVIS FINDINGS

Hepatobiliary: Subcentimeter lesion in the dome of the liver likely
representing a cavernous hemangioma. Gallbladder and bile ducts are
unremarkable.

Pancreas: Unremarkable. No pancreatic ductal dilatation or
surrounding inflammatory changes.

Spleen: Normal in size without focal abnormality.

Adrenals/Urinary Tract: No adrenal gland nodules. Cyst in the upper
pole left kidney. Nephrograms are symmetrical and otherwise
homogeneous. No hydronephrosis or hydroureter. Bladder is
unremarkable.

Stomach/Bowel: Stomach, small bowel, and colon are not abnormally
distended. No wall thickening or inflammatory changes are apparent.
Scattered diverticula in the sigmoid colon without evidence of
diverticulitis. Appendix is normal.

Vascular/Lymphatic: Aortic atherosclerosis. No enlarged abdominal or
pelvic lymph nodes.

Reproductive: Prostate is unremarkable.

Other: No free air or free fluid in the abdomen. Abdominal wall
musculature appears intact.

Musculoskeletal: Degenerative changes in the spine. No destructive
bone lesions.

Review of the MIP images confirms the above findings.
IMPRESSION: 1. No evidence of significant pulmonary embolus.
2. Small right pleural effusion. Patchy infiltrates in the right
lung are nonspecific but could indicate early multifocal pneumonia.
3. No acute process demonstrated in the abdomen or pelvis.
4. Aortic atherosclerosis.

## 2022-12-18 IMAGING — MR MR LUMBAR SPINE W/O CM
5 of 8 series · 26 of 48 positions shown · non-contrast
Comparison: Prior radiograph from 09/10/2014.

CLINICAL DATA: Initial evaluation for ongoing lower back pain.

EXAM:
MRI LUMBAR SPINE WITHOUT CONTRAST
TECHNIQUE: Multiplanar, multisequence MR imaging of the lumbar spine was
performed. No intravenous contrast was administered.

[Series 5: T2 · sagittal · 4.0mm · 0.81mm/px · 3 of 17 slices shown (1 of 2)]
[im 1/17]
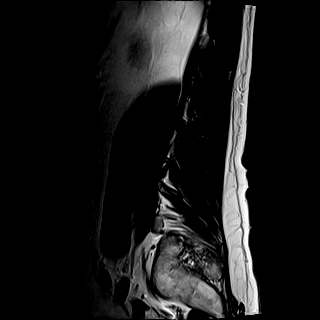
[im 9/17]
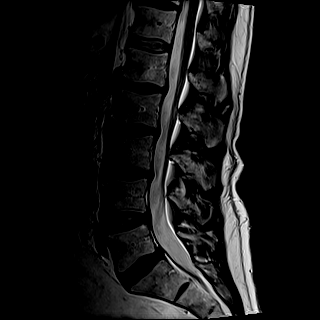
[im 17/17]
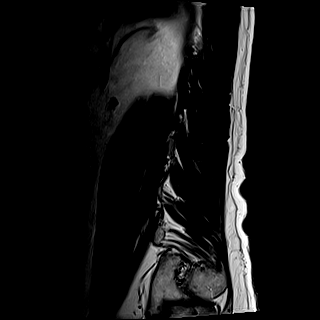

[Series 6: T1 · sagittal · 4.0mm · 0.81mm/px · 3 of 17 slices shown (1 of 2)]
[im 1/17]
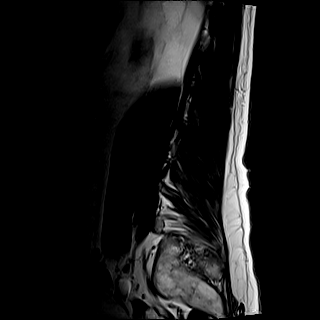
[im 9/17]
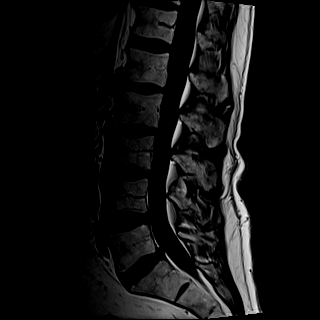
[im 17/17]
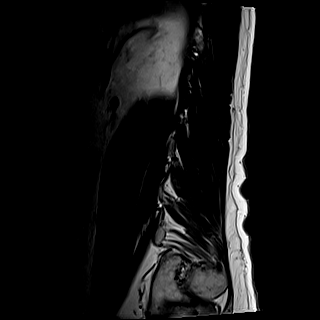

[Series 7: STIR · sagittal · 4.0mm · 0.51mm/px · 2 of 17 slices shown]
[im 1/17]
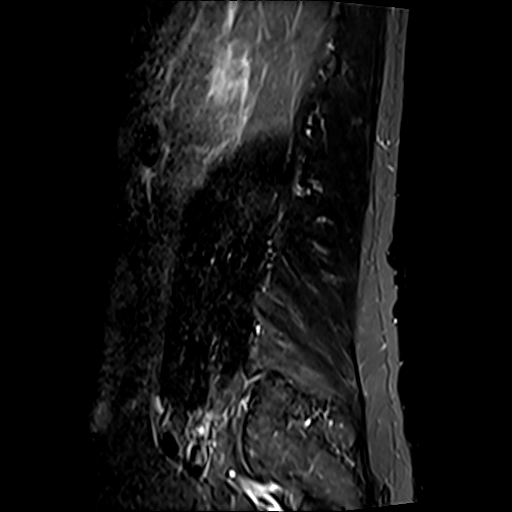
[im 9/17]
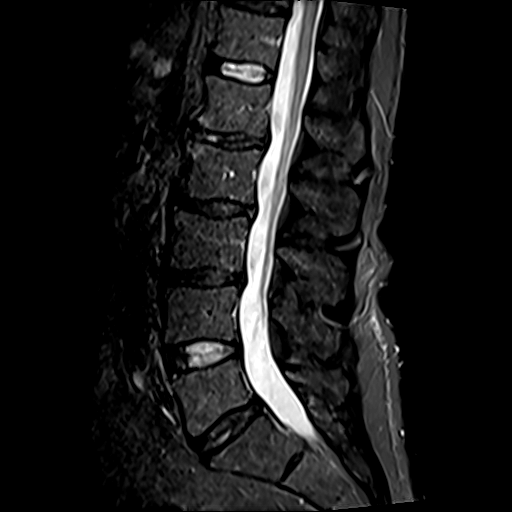

[Series 8: T2 · axial · 4.0mm · 0.62mm/px · z∈[-595,-342]mm · 9 of 48 slices shown (2 of 2)]
[im 1/48]
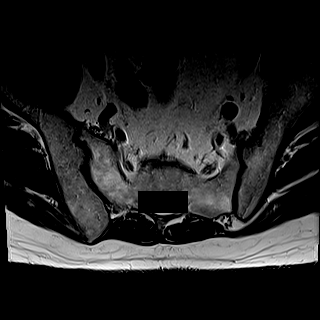
[im 6/48]
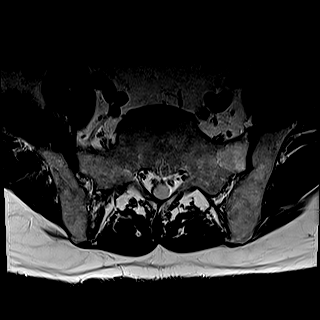
[im 12/48]
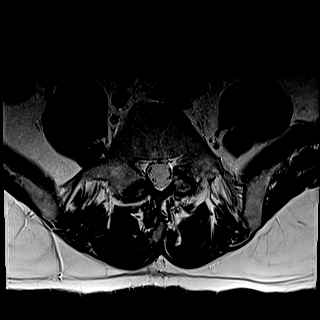
[im 18/48]
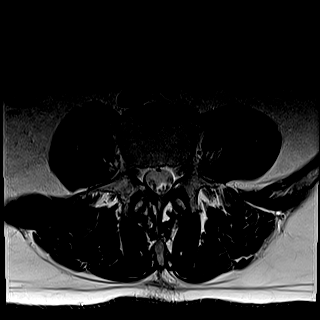
[im 24/48]
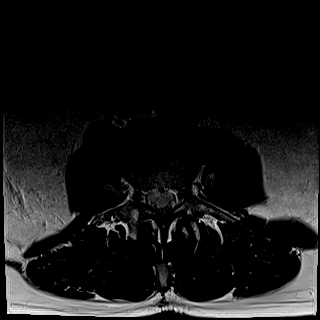
[im 30/48]
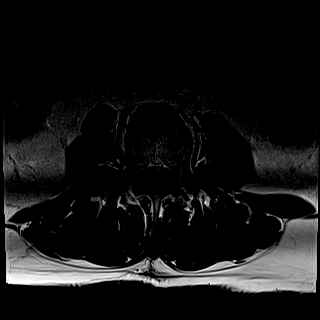
[im 36/48]
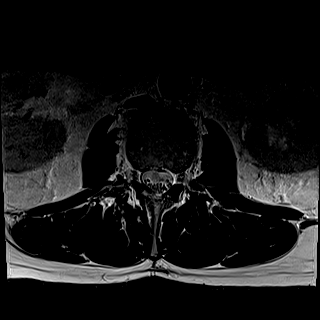
[im 42/48]
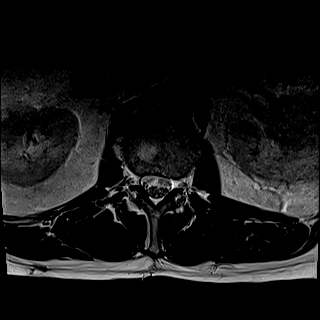
[im 48/48]
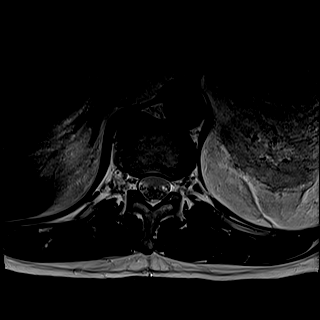

[Series 9: T1 · axial · 4.0mm · 0.39mm/px · z∈[-595,-342]mm · 9 of 48 slices shown (2 of 2)]
[im 1/48]
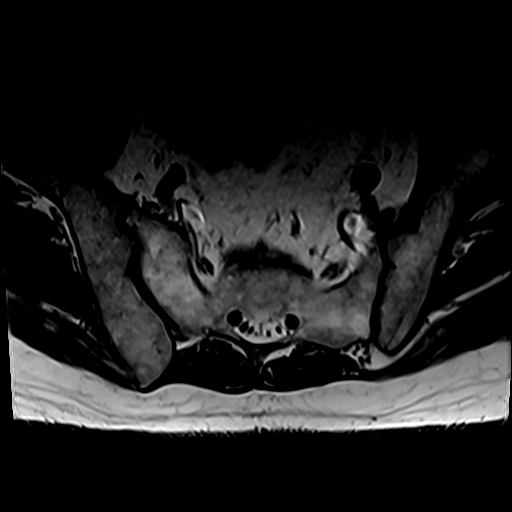
[im 6/48]
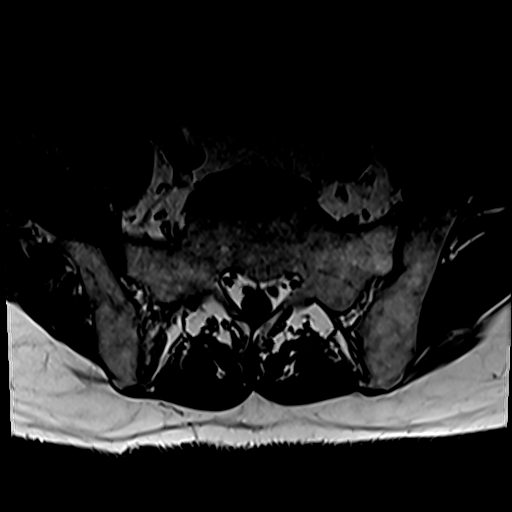
[im 12/48]
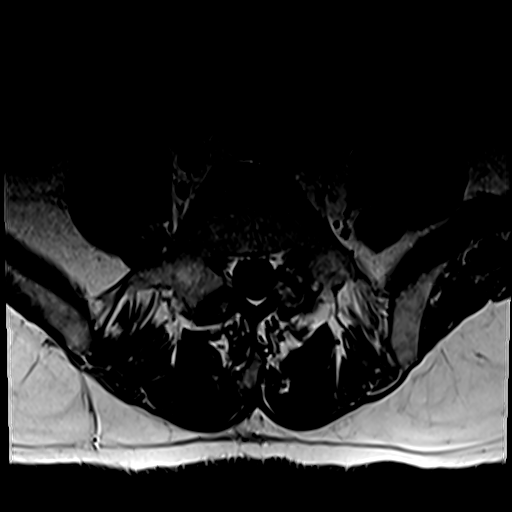
[im 18/48]
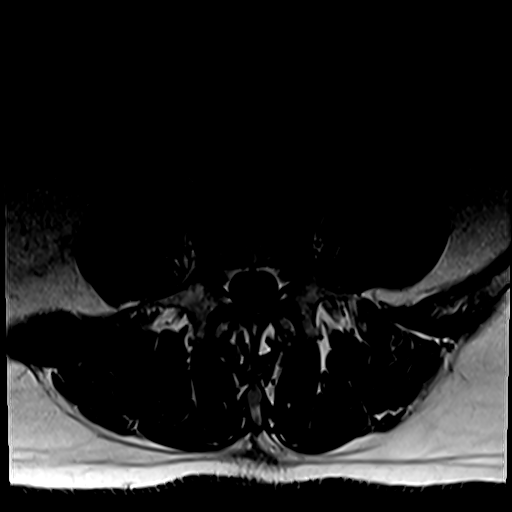
[im 24/48]
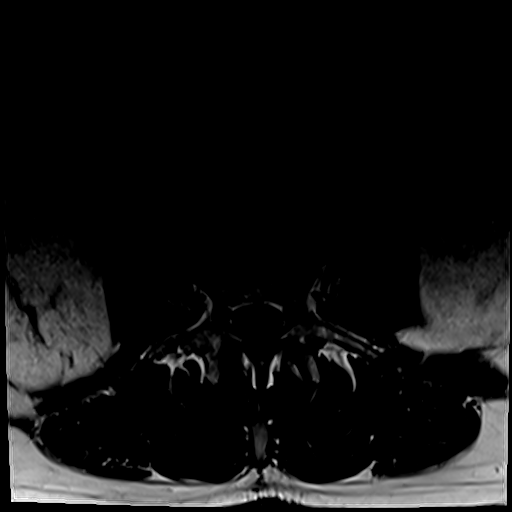
[im 30/48]
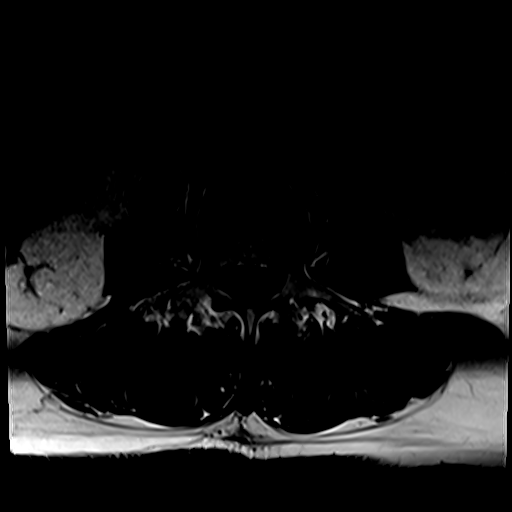
[im 36/48]
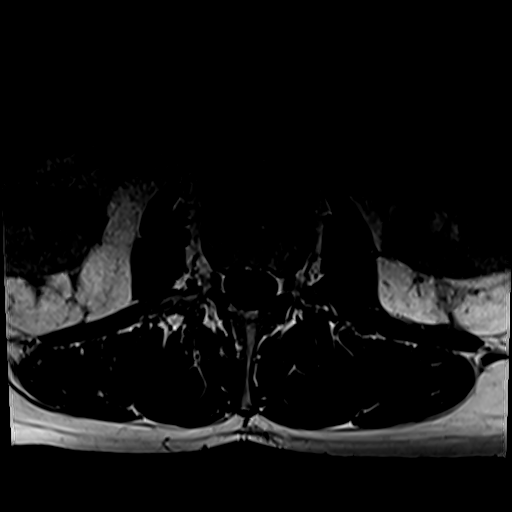
[im 42/48]
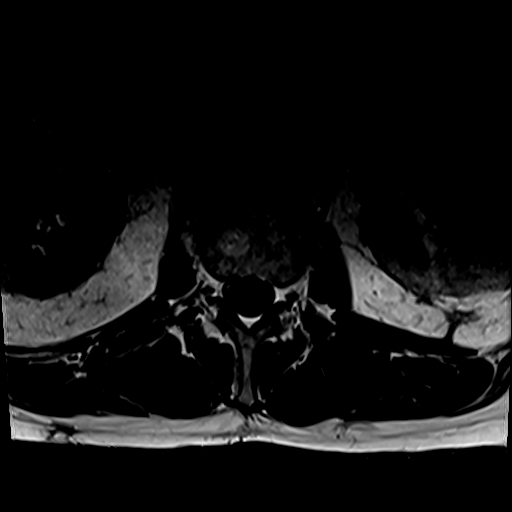
[im 48/48]
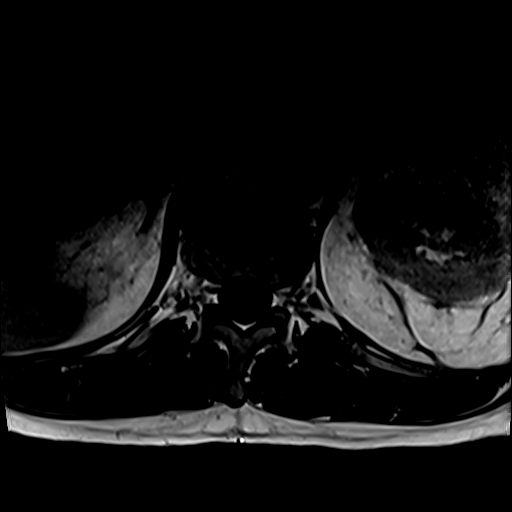

[26 of 48 positions shown; findings below may reference images not displayed]

FINDINGS: Segmentation: Standard. Lowest well-formed disc space labeled the
L5-S1 level.

Alignment: Trace retrolisthesis of L2 on L3. Alignment otherwise
normal with preservation of the normal lumbar lordosis.

Vertebrae: Vertebral body height well maintained without acute or
chronic fracture. Bone marrow signal intensity within normal limits.
1.5 cm benign hemangioma noted within the L1 vertebral body. No
other discrete or worrisome osseous lesions. No abnormal marrow
edema.

Conus medullaris and cauda equina: Conus extends to the L1 level.
Conus and cauda equina appear normal.

Paraspinal and other soft tissues: Paraspinous soft tissues within
normal limits. 2.4 cm T2 hyperintense simple cyst noted within the
interpolar left kidney. Visualized visceral structures otherwise
unremarkable.

Disc levels:

L1-2: Degenerative intervertebral disc space narrowing with disc
desiccation and diffuse disc bulge. Associated mild reactive
endplate change. Minimal facet spurring. No significant spinal
stenosis. Foramina remain patent.

L2-3: Trace retrolisthesis. Degenerative intervertebral disc space
narrowing with diffuse disc bulge and disc desiccation. Mild facet
and ligament flavum hypertrophy. No significant spinal stenosis.
Foramina remain patent.

L3-4: Degenerative intervertebral disc space narrowing with disc
desiccation and diffuse disc bulge. Superimposed broad-based left
foraminal to extraforaminal disc protrusion contacts the exiting
left L4 nerve root as it courses of the left neural foramen (series
8, image 22). Mild bilateral facet hypertrophy. No significant
spinal stenosis. Mild left L3 foraminal narrowing. Right neural
foramen remains patent.

L4-5: Negative interspace. Mild to moderate bilateral facet
hypertrophy. Borderline mild narrowing of the lateral recesses
bilaterally. Central canal remains patent. Mild bilateral L4
foraminal stenosis. No frank impingement.

L5-S1: Disc desiccation with mild disc bulge, slightly eccentric to
the right. Associated right paracentral annular fissure at the level
of the descending right S1 nerve root sheath. Mild bilateral facet
hypertrophy. No canal or lateral recess stenosis. Foramina remain
patent. No impingement.
IMPRESSION: 1. Broad-based left foraminal to extraforaminal disc protrusion at
L3-4, contacting and potentially irritating the exiting left L4
nerve root.
2. Right eccentric disc bulge with annular fissure at L5-S1, closely
approximating and potentially irritating the descending right S1
nerve root.
3. Additional mild for age degenerative disc disease and facet
hypertrophy elsewhere within the lumbar spine as above. No other
significant stenosis or neural impingement.

## 2022-12-24 IMAGING — DX DG CHEST 1V PORT
1 series · 1 of 1 positions shown · non-contrast
Comparison: 01/07/2021

CLINICAL DATA: Chest pain and shortness of breath. History of lung
cancer. Question sepsis.

EXAM:
PORTABLE CHEST 1 VIEW

[chest ap]
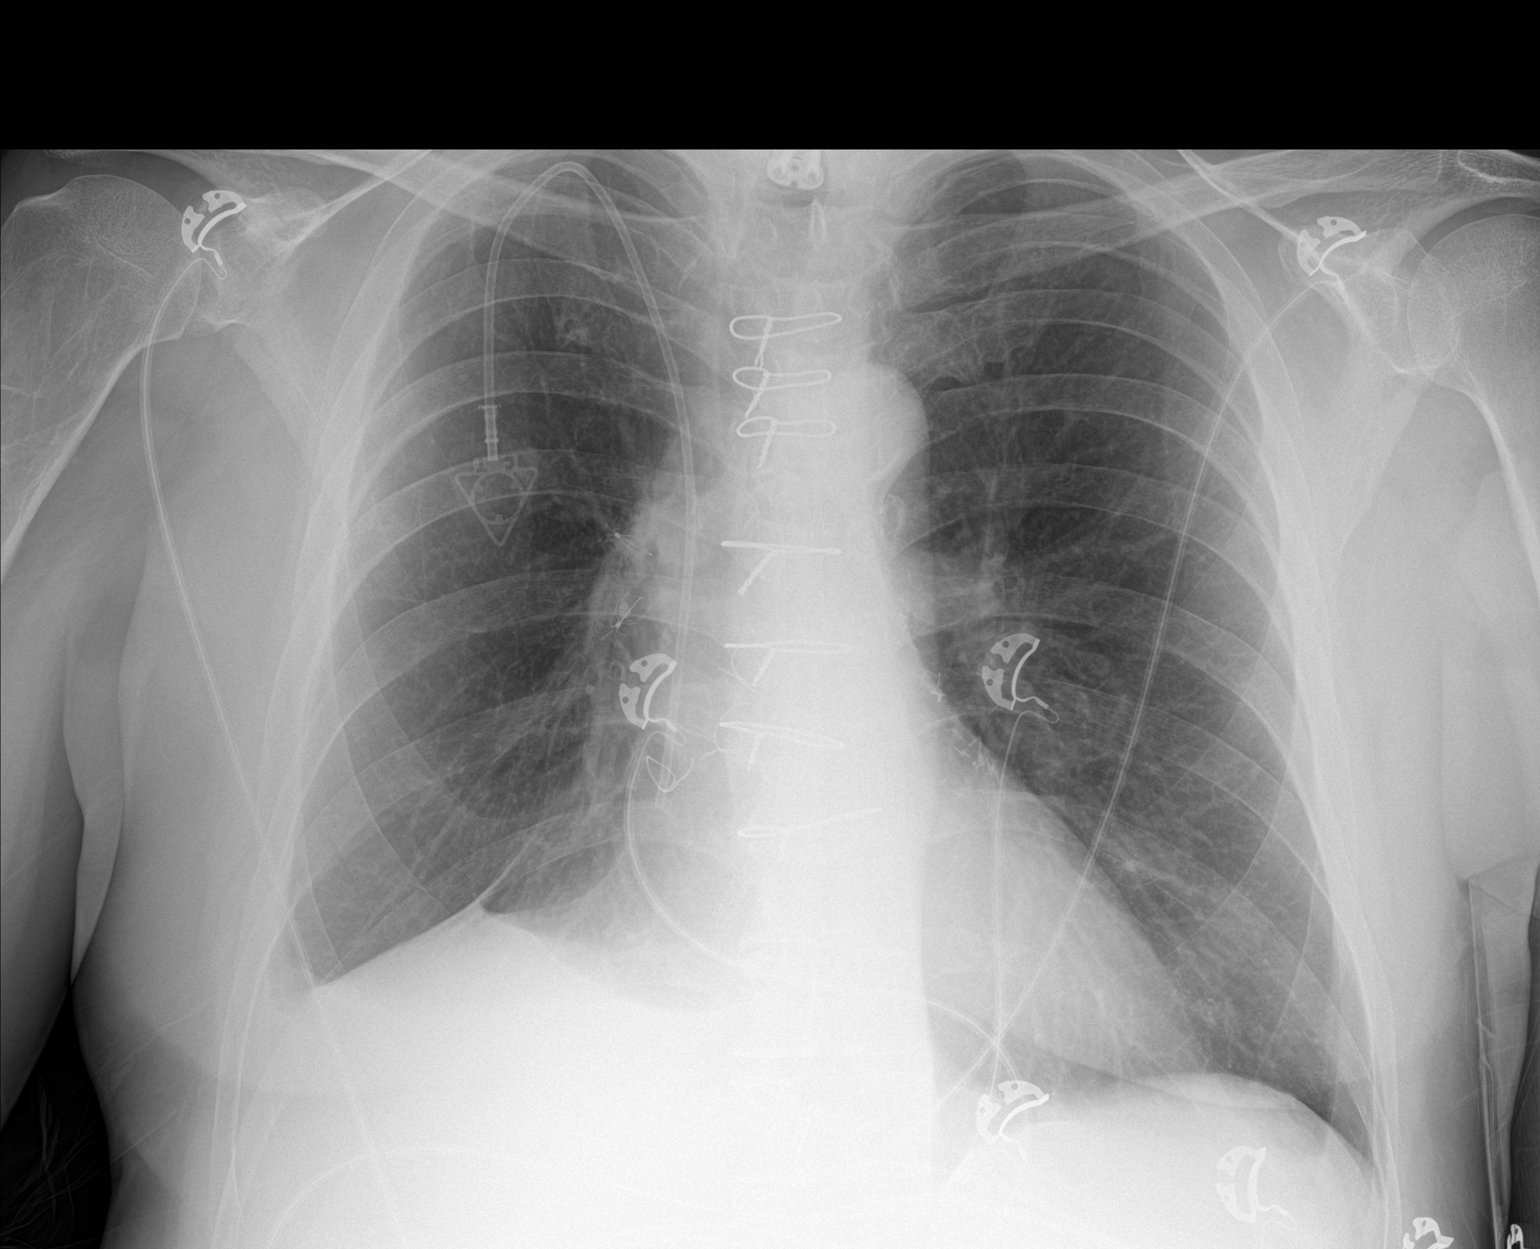

[1 of 1 positions shown; findings below may reference images not displayed]

FINDINGS: Power port placed from a right internal jugular approach has its tip
at the SVC RA junction. Previous median sternotomy and CABG.
Bronchial valves present on the right. Previous lobectomy on the
right. No sign of active infiltrate or collapse. There may be a
small amount of sub pulmonic effusion on the right.
IMPRESSION: Bronchial valves on the right. Small amount of sub pulmonic effusion
on the right. No pneumonia visible by radiography.

## 2022-12-24 IMAGING — CT CT ANGIO CHEST
3 of 11 series · 17 of 36 positions shown · IV contrast (Omnipaque)
Comparison: 02/14/2021

CLINICAL DATA: Lung cancer, chemotherapy, shortness of breath

EXAM:
CT ANGIOGRAPHY CHEST WITH CONTRAST
TECHNIQUE: Multidetector CT imaging of the chest was performed using the
standard protocol during bolus administration of intravenous
contrast. Multiplanar CT image reconstructions and MIPs were
obtained to evaluate the vascular anatomy.
CONTRAST:  100mL OMNIPAQUE IOHEXOL 350 MG/ML SOLN

[Series 7: pe lung · axial · 0.73mm/px · z∈[+1126,+1214]mm · 2 of 87 slices shown]
[im 29/87  mediastinal]
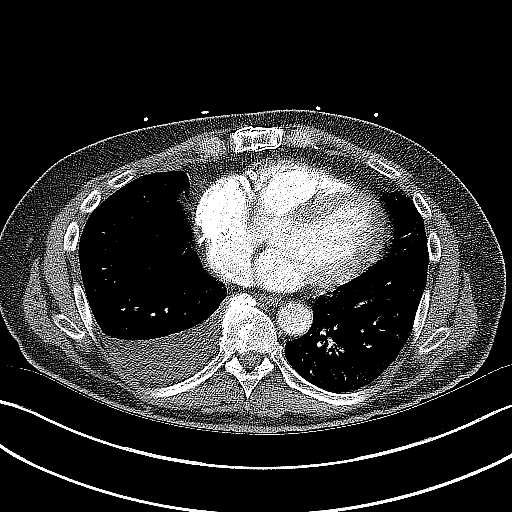
[im 58/87  mediastinal]
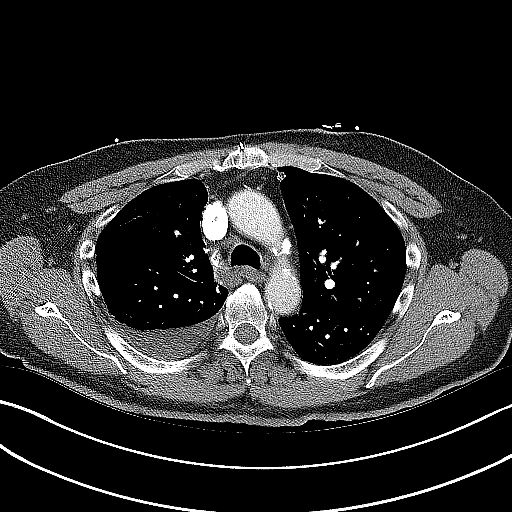

[Series 8: pe thins · axial · 0.73mm/px · z∈[+1000,+1278]mm · 14 of 322 slices shown]
[im 22/322  lung]
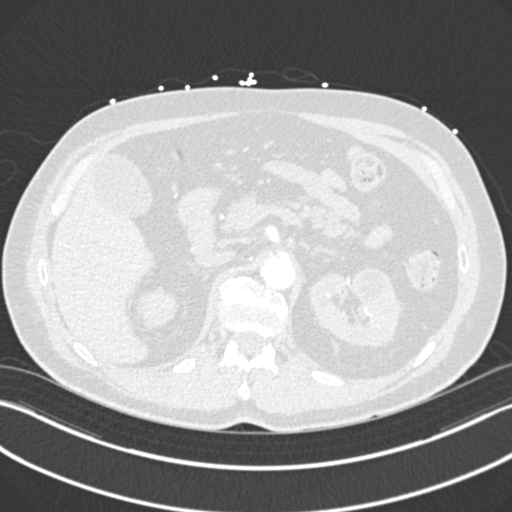
[im 43/322  mediastinal]
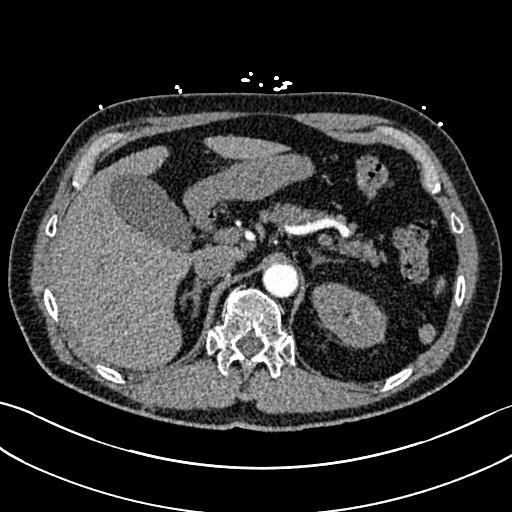
[im 65/322  lung]
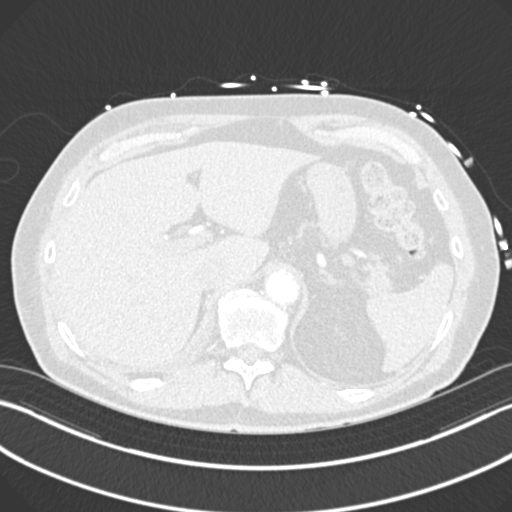
[im 86/322  mediastinal]
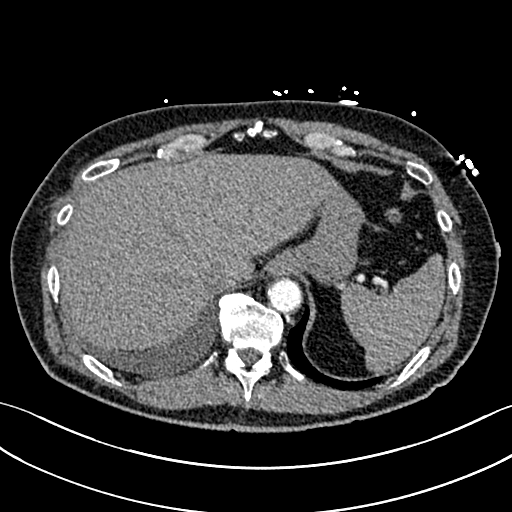
[im 108/322  lung]
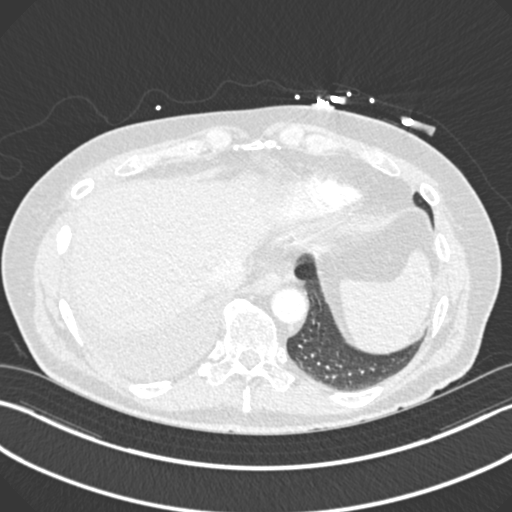
[im 129/322  mediastinal]
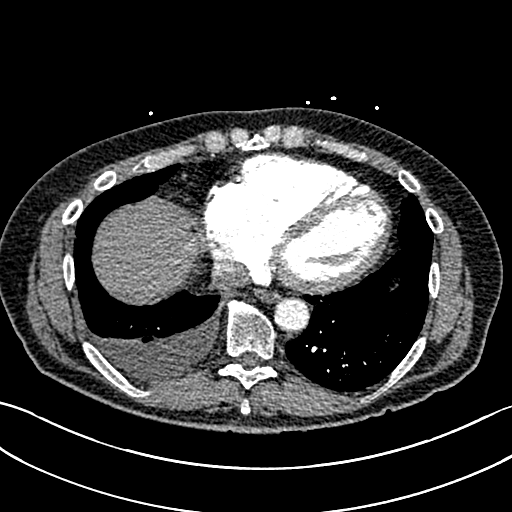
[im 150/322  lung]
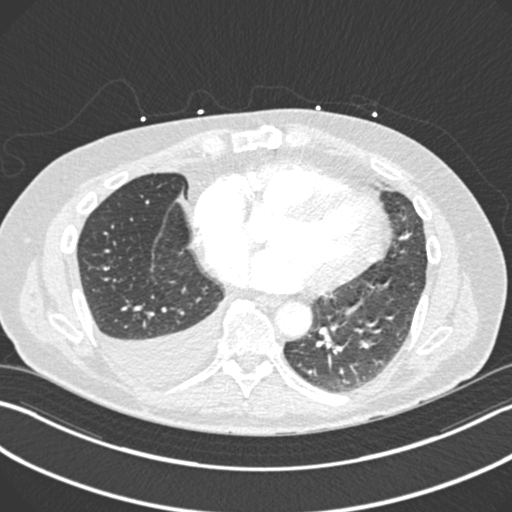
[im 172/322  mediastinal]
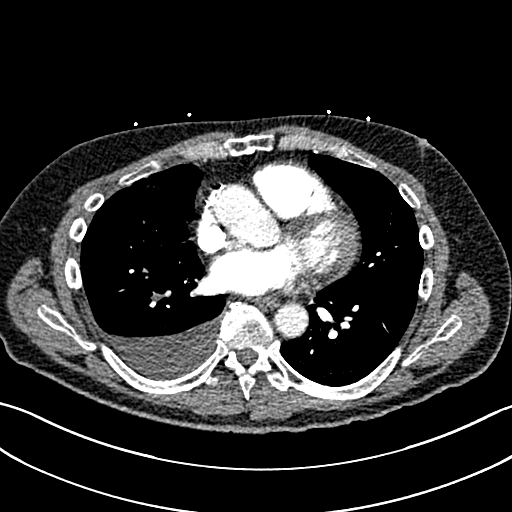
[im 193/322  lung]
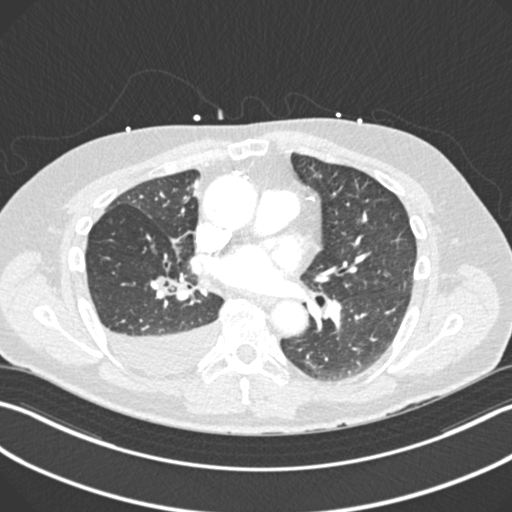
[im 215/322  mediastinal]
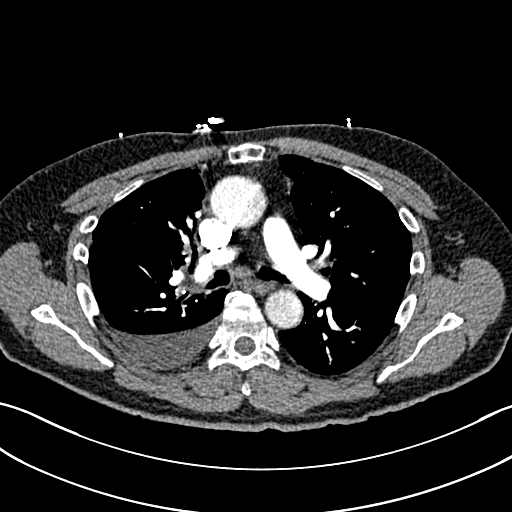
[im 236/322  lung]
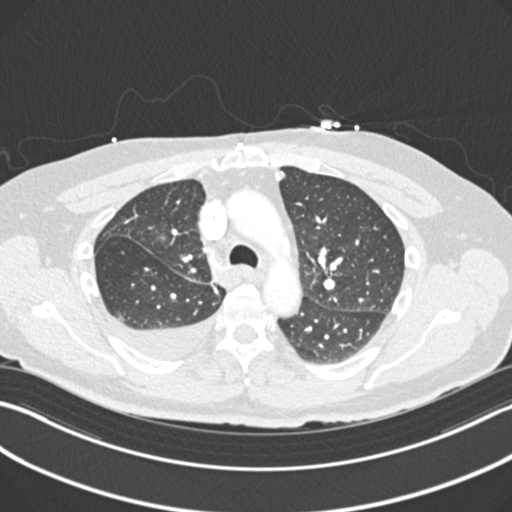
[im 257/322  mediastinal]
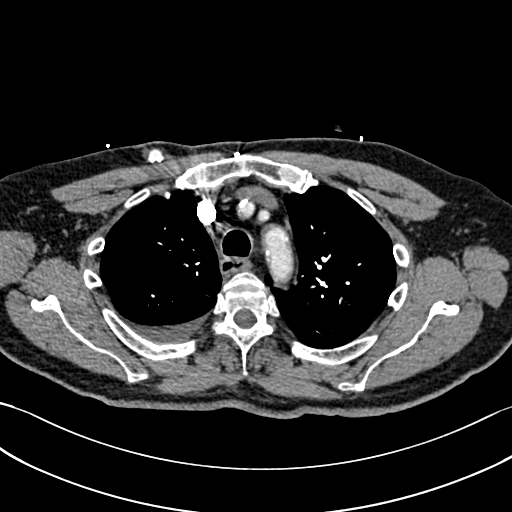
[im 279/322  lung]
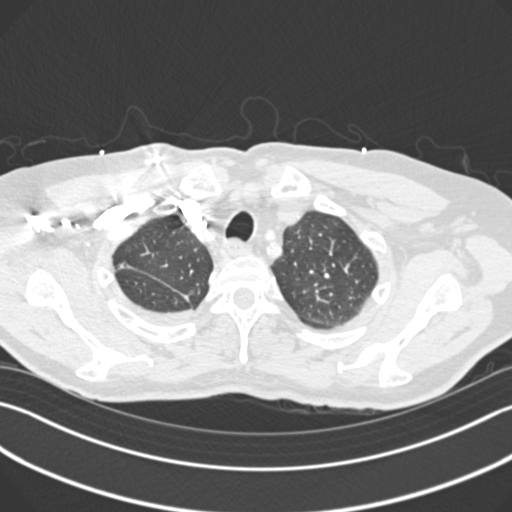
[im 300/322  mediastinal]
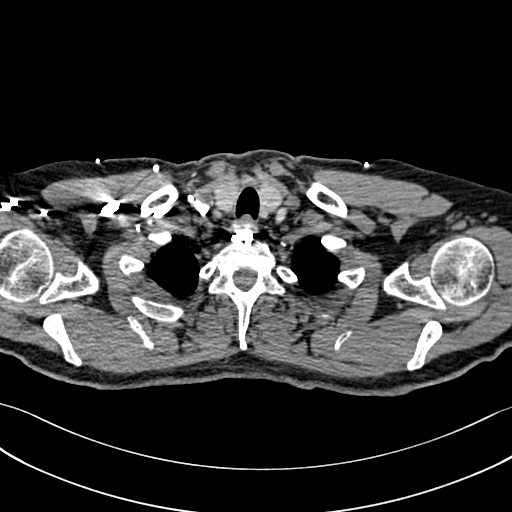

[Series 9: pe coronal mpr · coronal · 0.63mm/px · 1 of 125 slices shown]
[im 63/125  mediastinal]
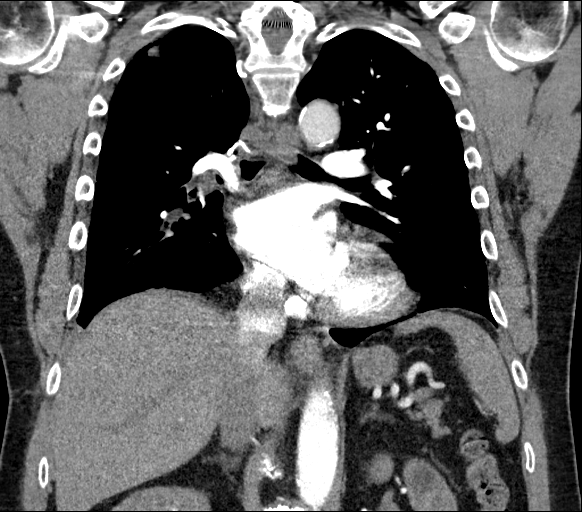

[17 of 36 positions shown; findings below may reference images not displayed]

FINDINGS: Cardiovascular: Satisfactory opacification of the pulmonary arteries
to the segmental level. No evidence of pulmonary embolism. Normal
heart size. No pericardial effusion. Coronary artery calcification.
Post CABG. Thoracic aorta is stable in caliber measuring dilated at
4 cm along the ascending portion.

Mediastinum/Nodes: No new enlarged lymph nodes.

Lungs/Pleura: Increased, small to moderate right pleural effusion
with adjacent atelectasis. Post right upper lobectomy.

Upper Abdomen: No acute abnormality.

Musculoskeletal: No acute osseous abnormality.

Review of the MIP images confirms the above findings.
IMPRESSION: No acute pulmonary embolism.

Increased, small to moderate right pleural effusion.

## 2022-12-25 IMAGING — DX DG CHEST 1V
1 series · 1 of 1 positions shown · non-contrast
Comparison: 02/24/2021 radiograph and chest CT

CLINICAL DATA: Post thoracentesis

EXAM:
CHEST  1 VIEW

[chest ap]
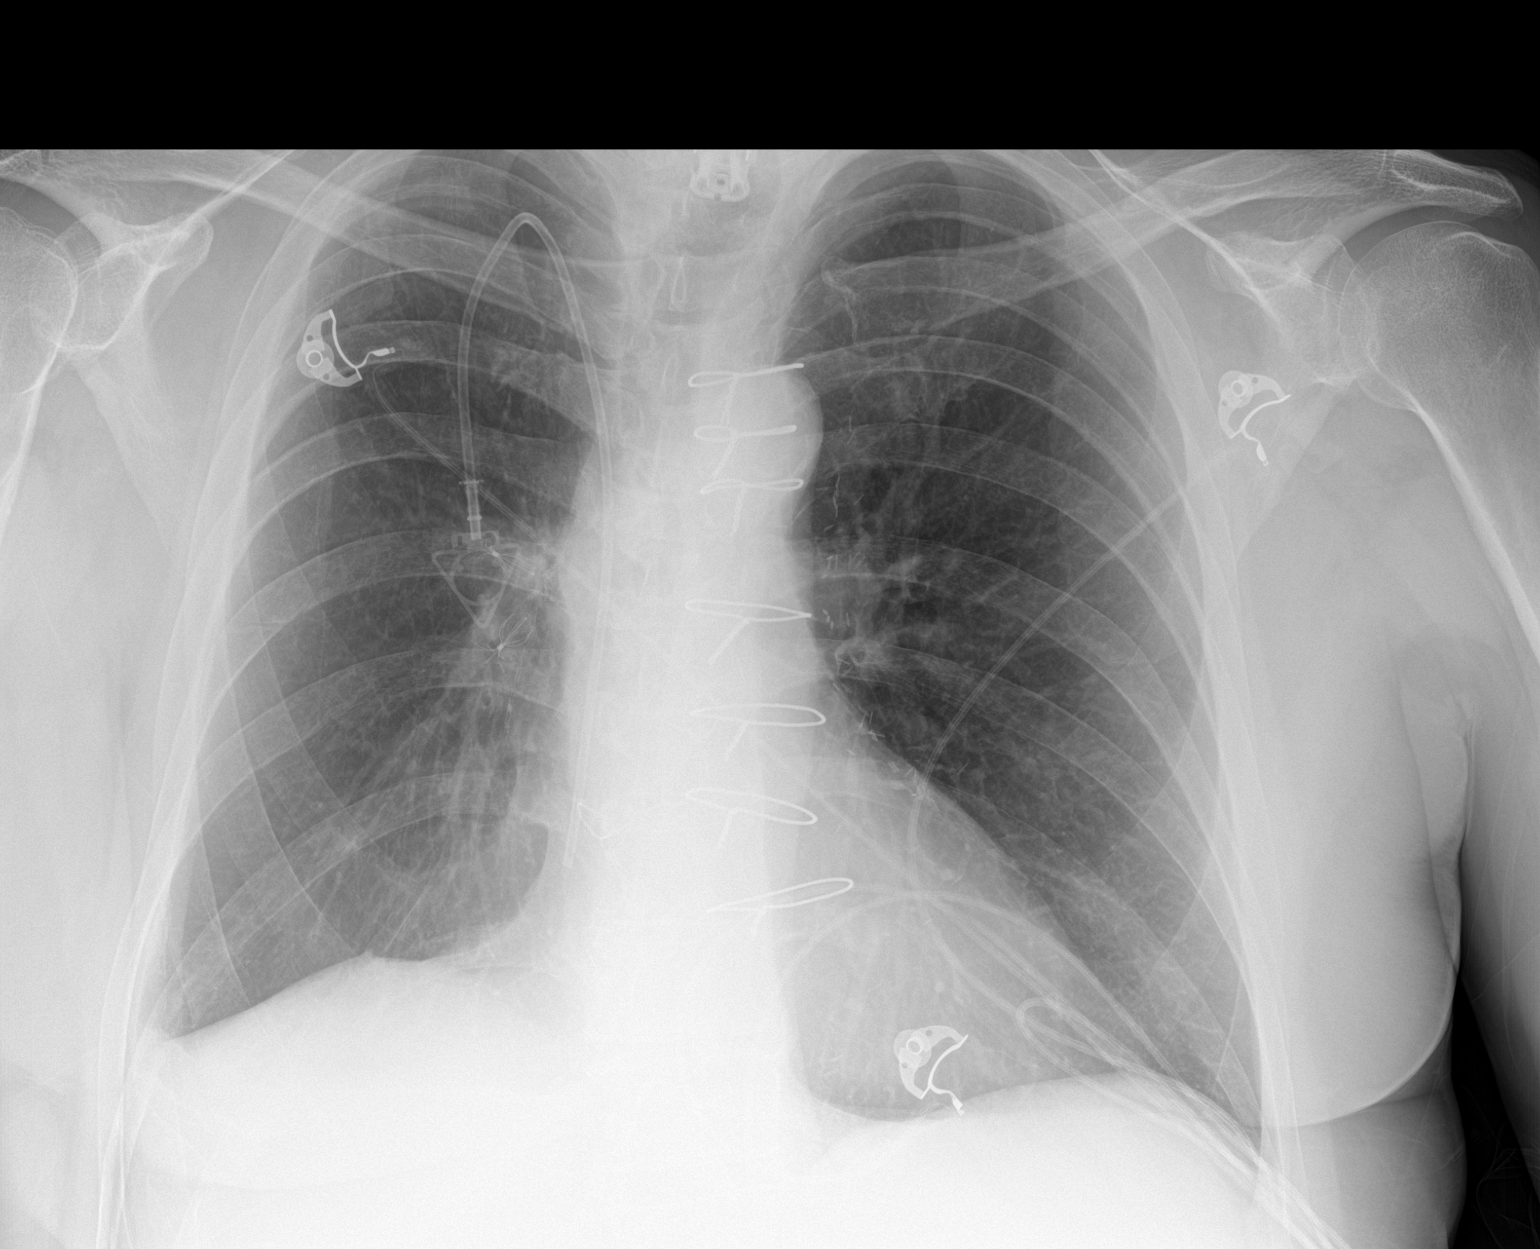

[1 of 1 positions shown; findings below may reference images not displayed]

FINDINGS: Post sternotomy changes. Right-sided central venous catheter tip
over the proximal right atrium. Trace right-sided pleural effusion.
No visible pneumothorax. Stable cardiomediastinal silhouette with
aortic atherosclerosis. Surgical hardware at the thoracic inlet
region.
IMPRESSION: 1. Trace right pleural effusion without visible pneumothorax post
thoracentesis.
2. No significant changes.

## 2022-12-28 IMAGING — CT CT ANGIO CHEST
2 of 6 series · 18 of 36 positions shown · IV contrast (OMNIPAQUE 350)
Comparison: CT angiography chest 02/24/2021, 02/14/2021

CLINICAL DATA: Neuroendocrine lung carcinoma with brain metastases,
currently on chemo Per EMS, Pt, from home, c/o SOB w/ exertion and
chronic upper abdominal pain. Pt was discharged yesterday.

EXAM:
CT ANGIOGRAPHY CHEST WITH CONTRAST
TECHNIQUE: Multidetector CT imaging of the chest was performed using the
standard protocol during bolus administration of intravenous
contrast. Multiplanar CT image reconstructions and MIPs were
obtained to evaluate the vascular anatomy.
CONTRAST:  80mL OMNIPAQUE IOHEXOL 350 MG/ML SOLN

[Series 5: thins · axial · 0.73mm/px · z∈[-382,-100]mm · 17 of 318 slices shown]
[im 18/318  lung]
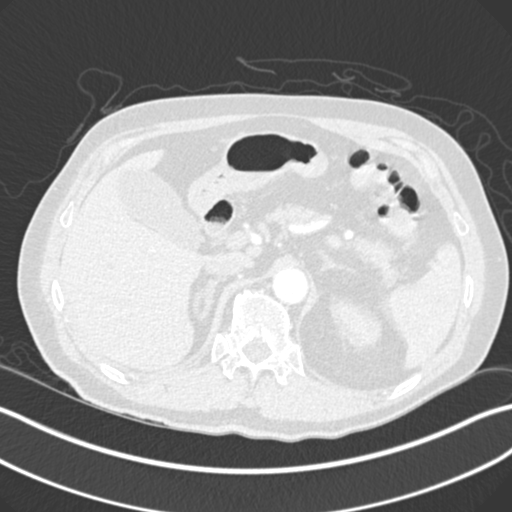
[im 36/318  mediastinal]
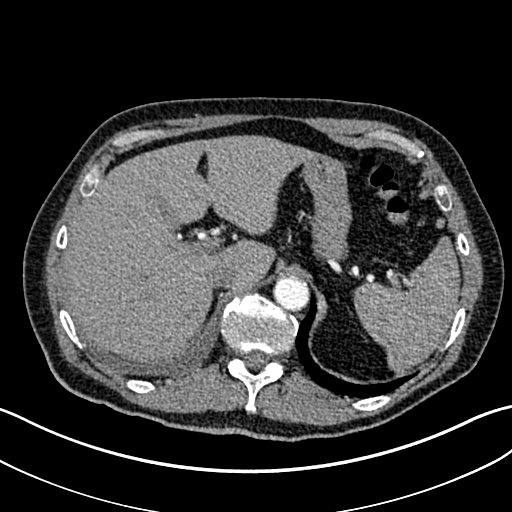
[im 53/318  lung]
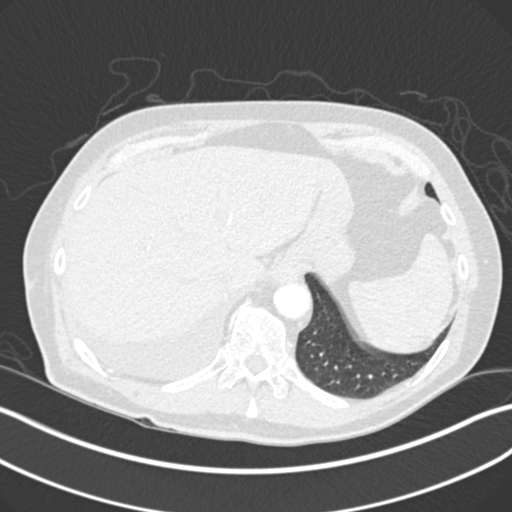
[im 71/318  mediastinal]
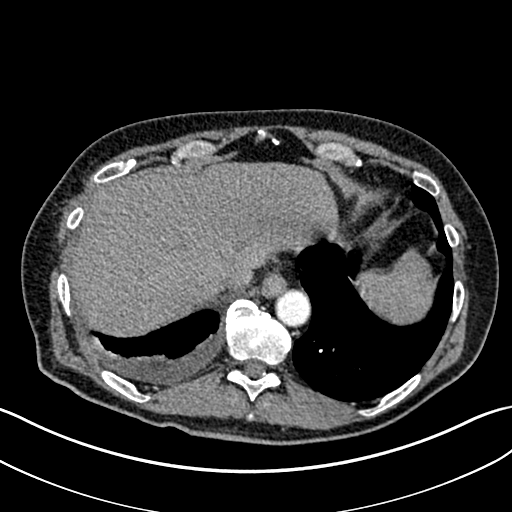
[im 89/318  lung]
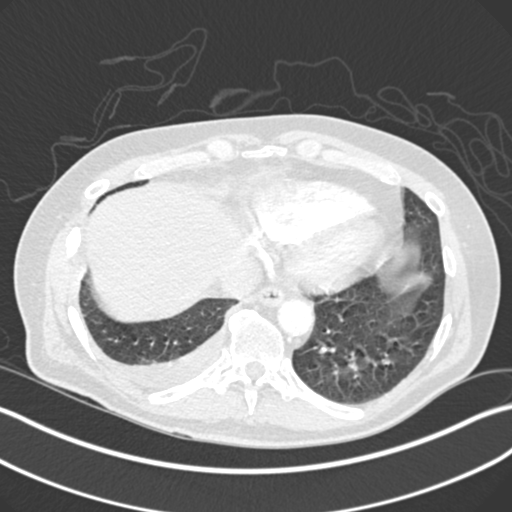
[im 106/318  mediastinal]
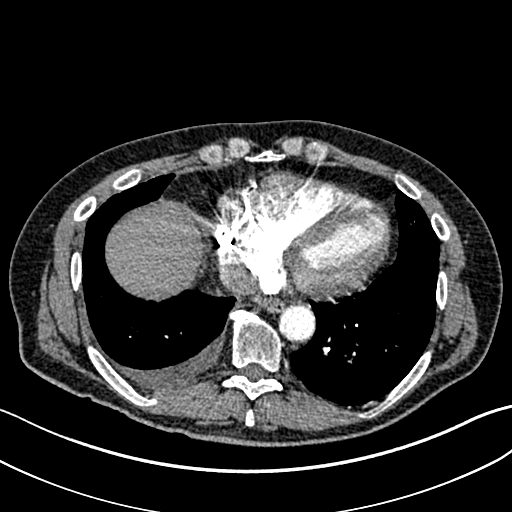
[im 124/318  lung]
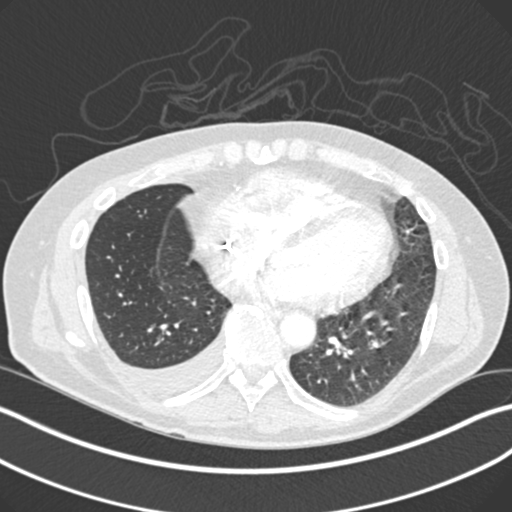
[im 141/318  mediastinal]
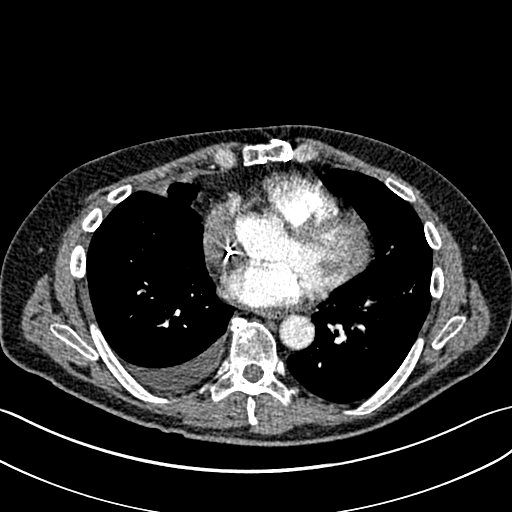
[im 159/318  lung]
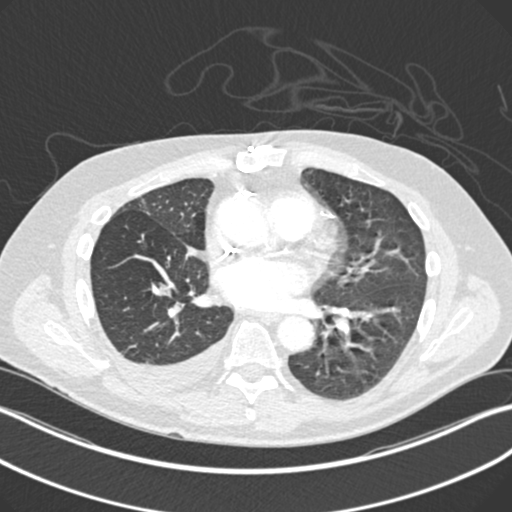
[im 177/318  mediastinal]
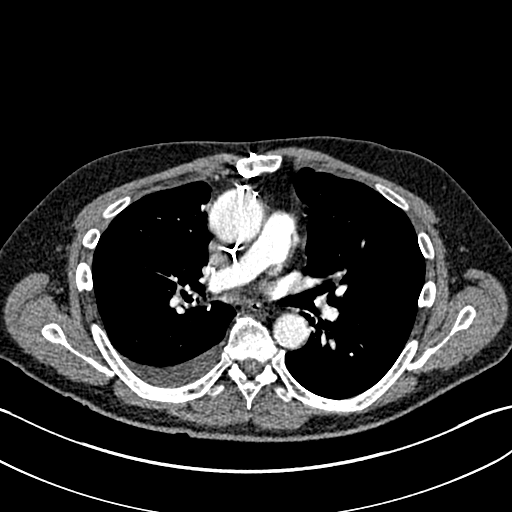
[im 194/318  lung]
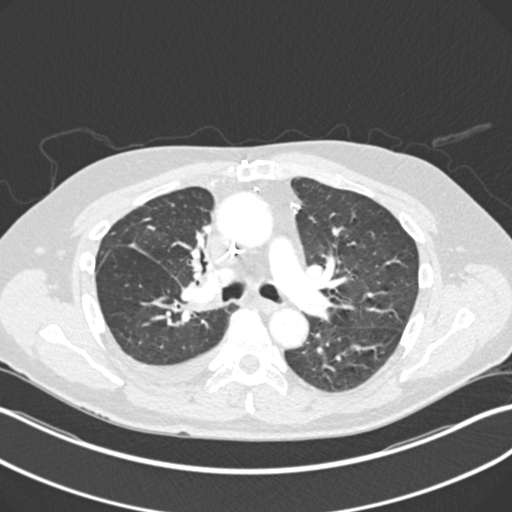
[im 212/318  mediastinal]
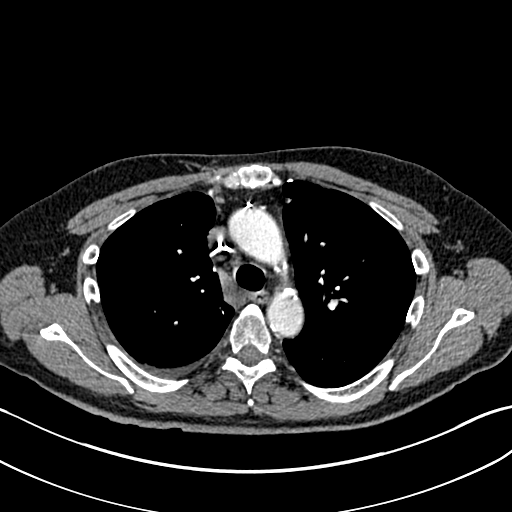
[im 229/318  lung]
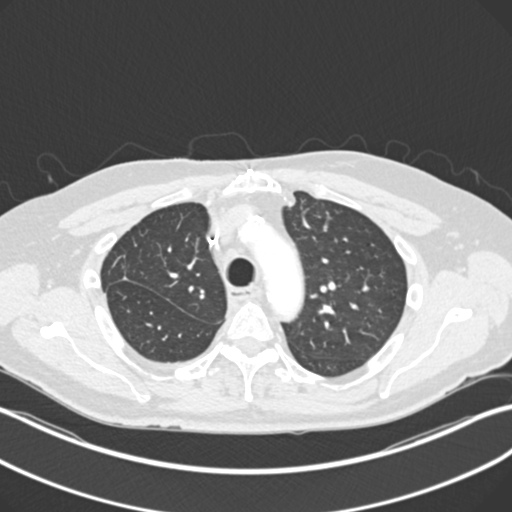
[im 247/318  mediastinal]
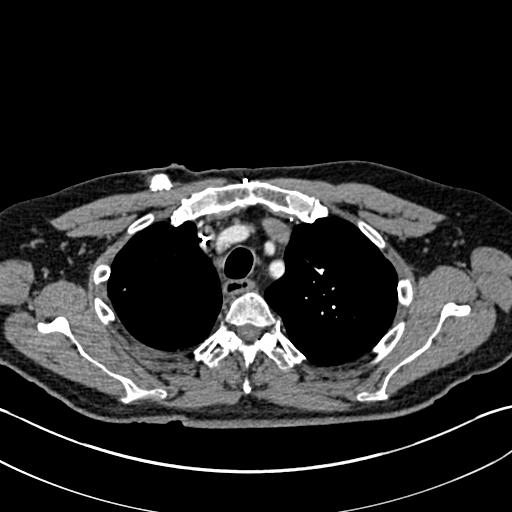
[im 265/318  lung]
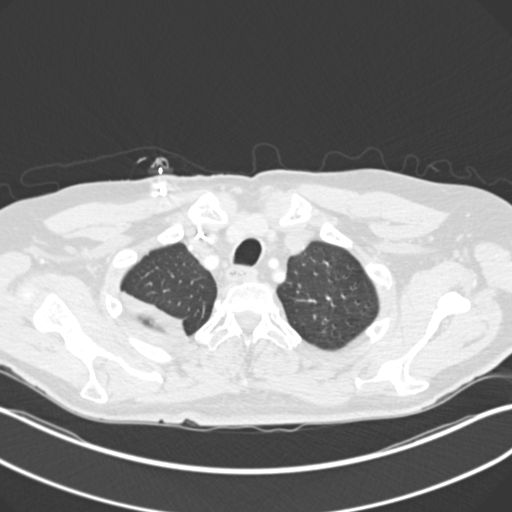
[im 282/318  mediastinal]
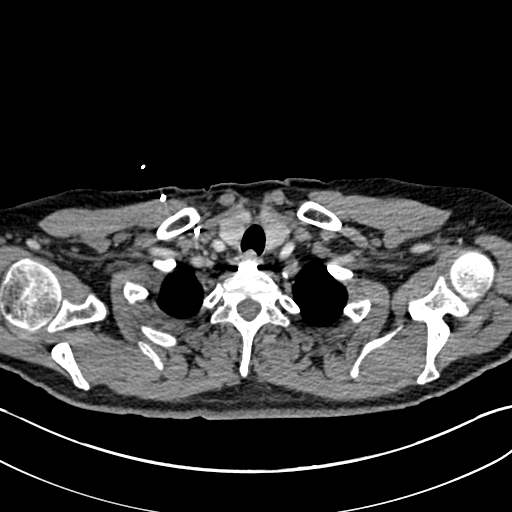
[im 300/318  lung]
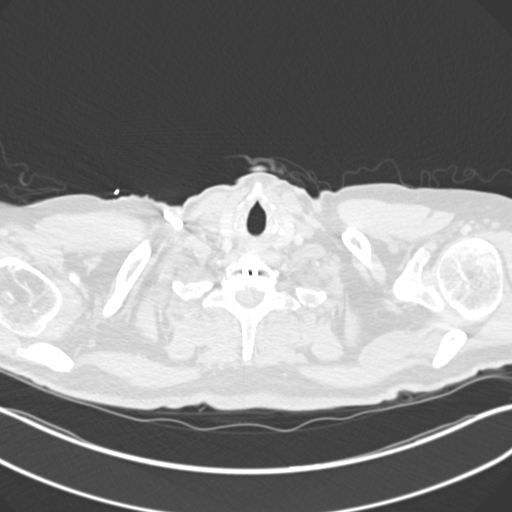

[Series 7: coronal mpr · coronal · 0.63mm/px · 1 of 130 slices shown]
[im 65/130  mediastinal]
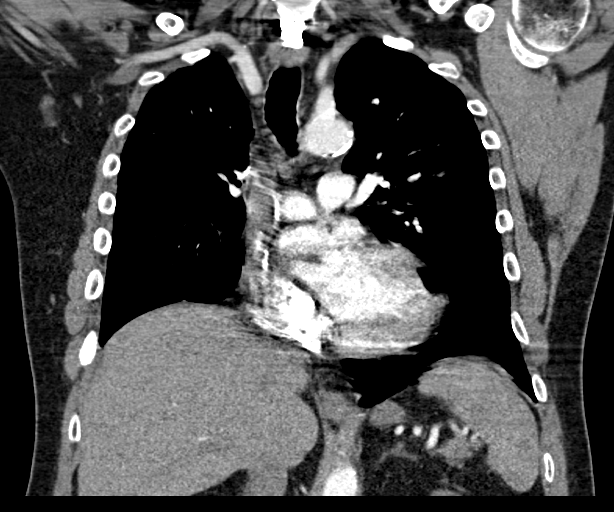

[18 of 36 positions shown; findings below may reference images not displayed]

FINDINGS: Cardiovascular:

Fairly satisfactory opacification of the pulmonary arteries to the
segmental level. Limited evaluation of the subsegmental level due to
motion artifact. No evidence of pulmonary embolism. The main
pulmonary artery is normal in caliber.

Normal heart size. No significant pericardial effusion. The thoracic
aorta is stable in caliber measuring up to 3.8 cm on axial imaging
([DATE]). At least mild atherosclerotic plaque of the thoracic aorta.
Coronary artery calcifications status post coronary artery bypass
graft.

Mediastinum/Nodes: No enlarged mediastinal, hilar, or axillary lymph
nodes. Thyroid gland, trachea, and esophagus demonstrate no
significant findings.

Lungs/Pleura: Status post right upper lobectomy. Persistent mucous
plugging noted within the right lower lobe. Interval decrease in
size of a trace right pleural effusion. Passive atelectasis of the
right lower lobe. No left pleural effusion. Linear atelectasis of
the right middle lobe. No focal consolidation. No pulmonary nodule,
no pulmonary mass.

Upper Abdomen: No acute abnormality. A couple of splenules are again
noted and partially visualized (282, [DATE])

Musculoskeletal:

No chest wall abnormality.

No suspicious lytic or blastic osseous lesions. No acute displaced
fracture. Multilevel degenerative changes of the spine. Anterior
surgical hardware of the C7-T1 level.

Review of the MIP images confirms the above findings.
IMPRESSION: 1. No pulmonary embolus.
2. Interval decrease in trace right pleural effusion in a patient
status post right upper lobectomy.
3. Persistent right lower lobe bronchial debris/mucous plugging. No
associated parenchymal or interstitial infection.
4.  Aortic Atherosclerosis ([id number]).

## 2022-12-28 IMAGING — DX DG CHEST 1V PORT
1 series · 1 of 1 positions shown · non-contrast
Comparison: 02/25/2021

CLINICAL DATA: Pt c/o SOB w/ exertion and chronic upper abdominal
pain. H/o CAD, Stroke, Myocardial Infarction. Former smoker. dyspnea

EXAM:
PORTABLE CHEST 1 VIEW

[chest ap]
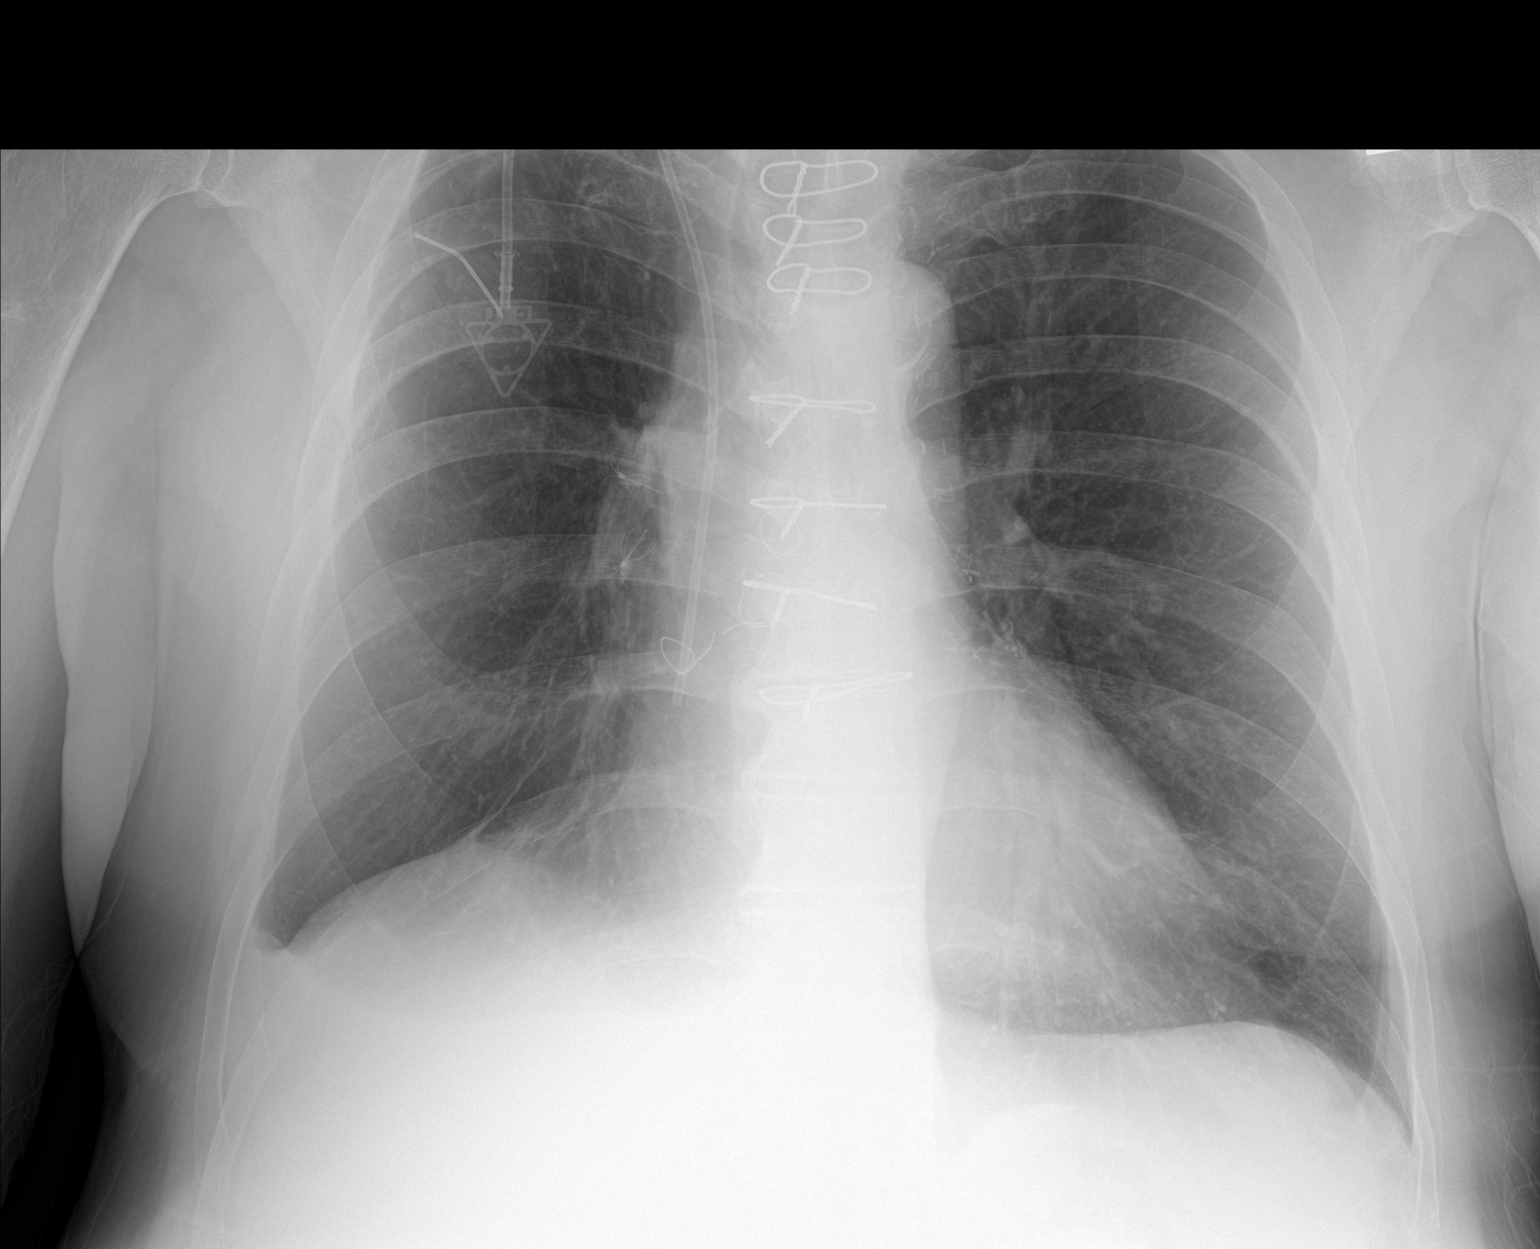

[1 of 1 positions shown; findings below may reference images not displayed]

FINDINGS: Port in the anterior chest wall with tip in distal SVC. Sternotomy
wires overlie normal cardiac silhouette. Normal pulmonary
vasculature. Small chronic RIGHT effusion. No infiltrate or
pneumothorax. No acute osseous abnormality. Anterior cervical fusion
IMPRESSION: 1. Small chronic RIGHT effusion.
2. No pneumonia or edema.

## 2023-01-18 ENCOUNTER — Ambulatory Visit: Payer: Medicare Other | Admitting: Psychology

## 2023-01-19 IMAGING — DX DG CHEST 1V PORT
1 series · 1 of 1 positions shown · non-contrast
Comparison: 02/28/2021

CLINICAL DATA: Fever and chills starting last night. Ongoing
chemotherapy for lung cancer.

EXAM:
PORTABLE CHEST 1 VIEW

[chest ap]
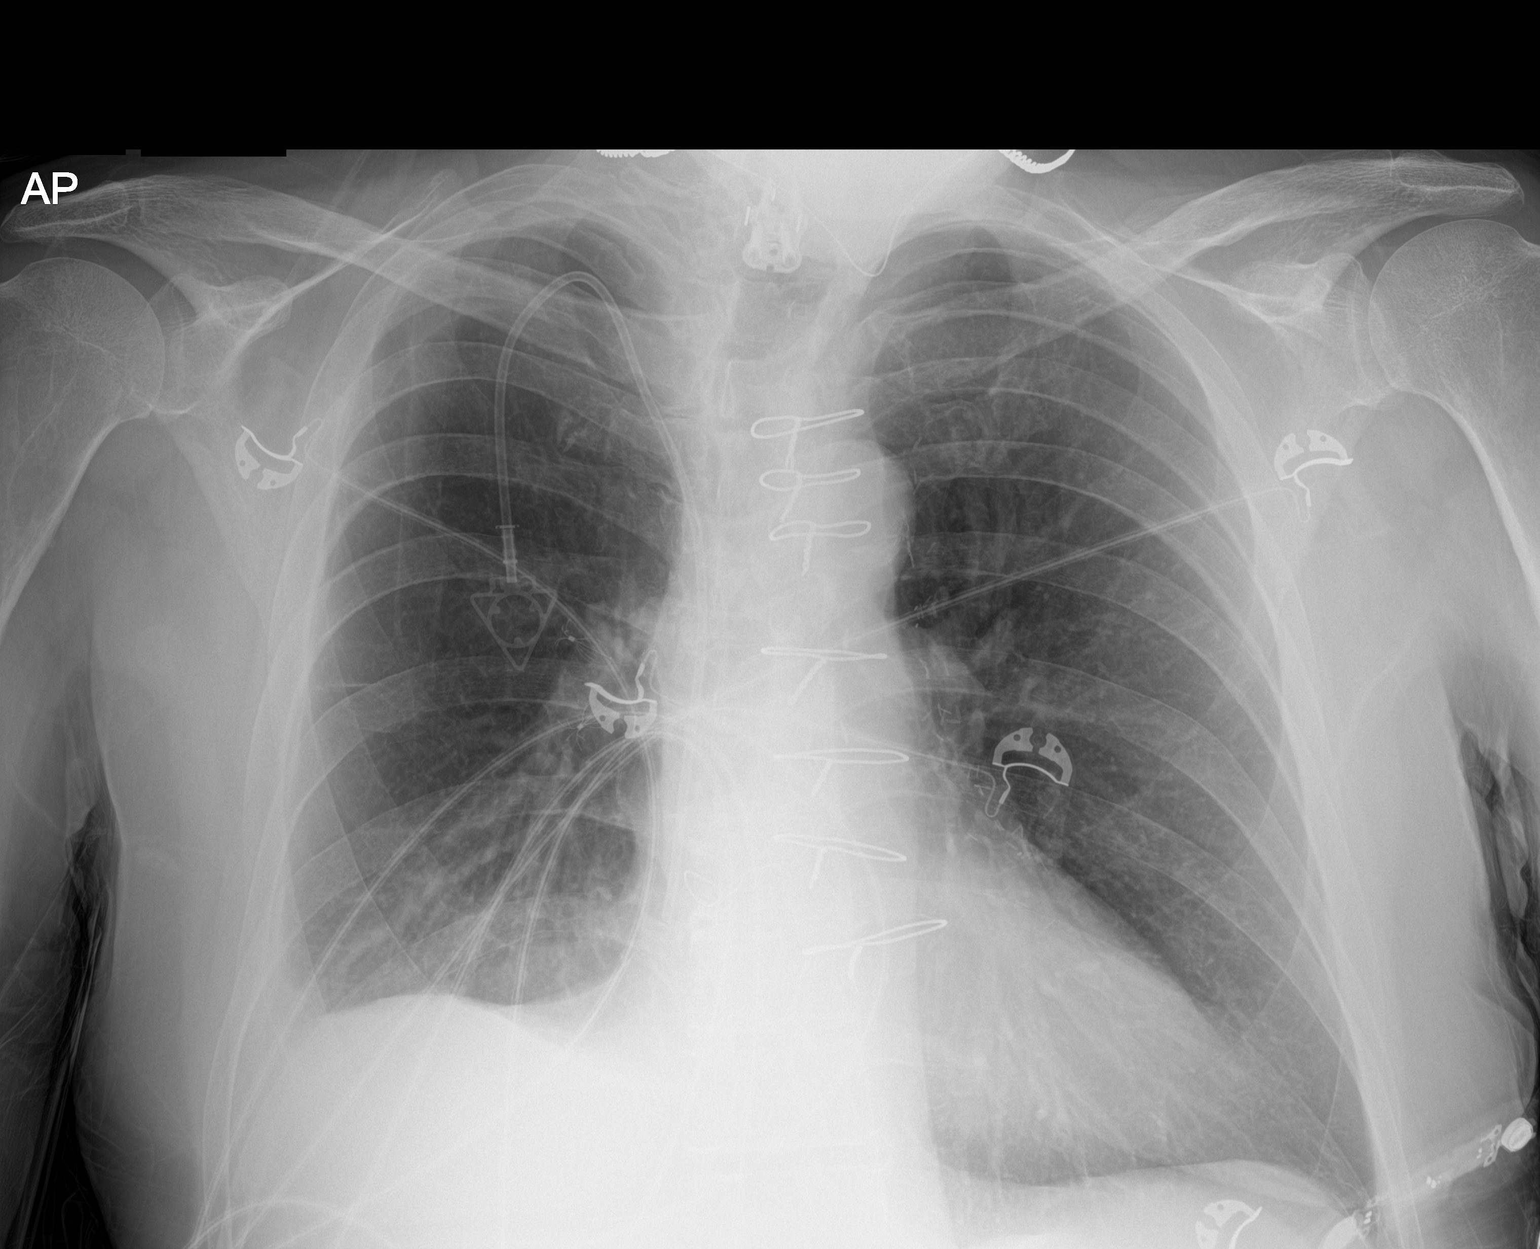

[1 of 1 positions shown; findings below may reference images not displayed]

FINDINGS: Fall injectable right Port-A-Cath tip: Right atrium. Prior CABG.
Prior right upper lobectomy with expected volume loss in the right
hemithorax. Lower cervical plate and screw fixator.

Borderline enlargement of the cardiopericardial silhouette, without
edema. There is some mild blunting of the right lateral costophrenic
angle along with some progressive density at the right lung base
compared to 02/28/2021; some of this may be from the patient's known
pleural effusion but a component of early pneumonia is difficult to
exclude given the patient's symptoms.
IMPRESSION: 1. Increased density at the right lung base. A component of this may
be due to the patient's small right pleural effusion, but early
pneumonia at the right lung base is difficult to exclude given the
change from 02/28/2021.
2. Prior right upper lobectomy.
3. Borderline enlargement of the cardiopericardial silhouette,
without edema.
4.

## 2023-01-23 IMAGING — DX DG FOOT COMPLETE 3+V*L*
3 series · 3 of 3 positions shown · non-contrast
Comparison: 08/28/2008.

CLINICAL DATA: Left foot pain.

EXAM:
LEFT FOOT - COMPLETE 3+ VIEW

[foot ap]
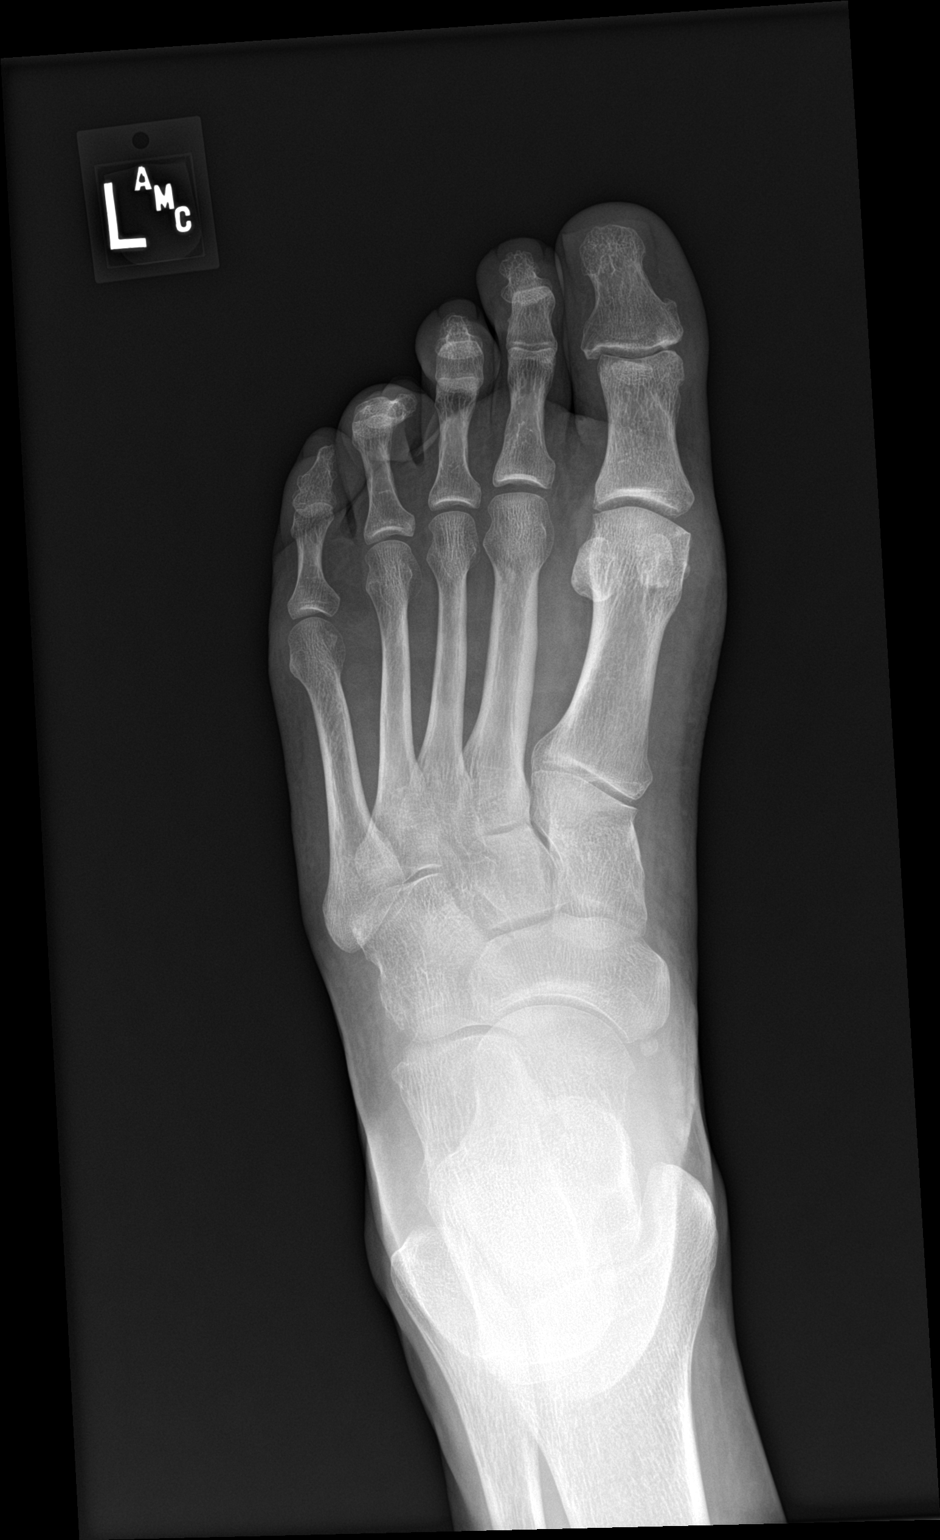

[foot obl]
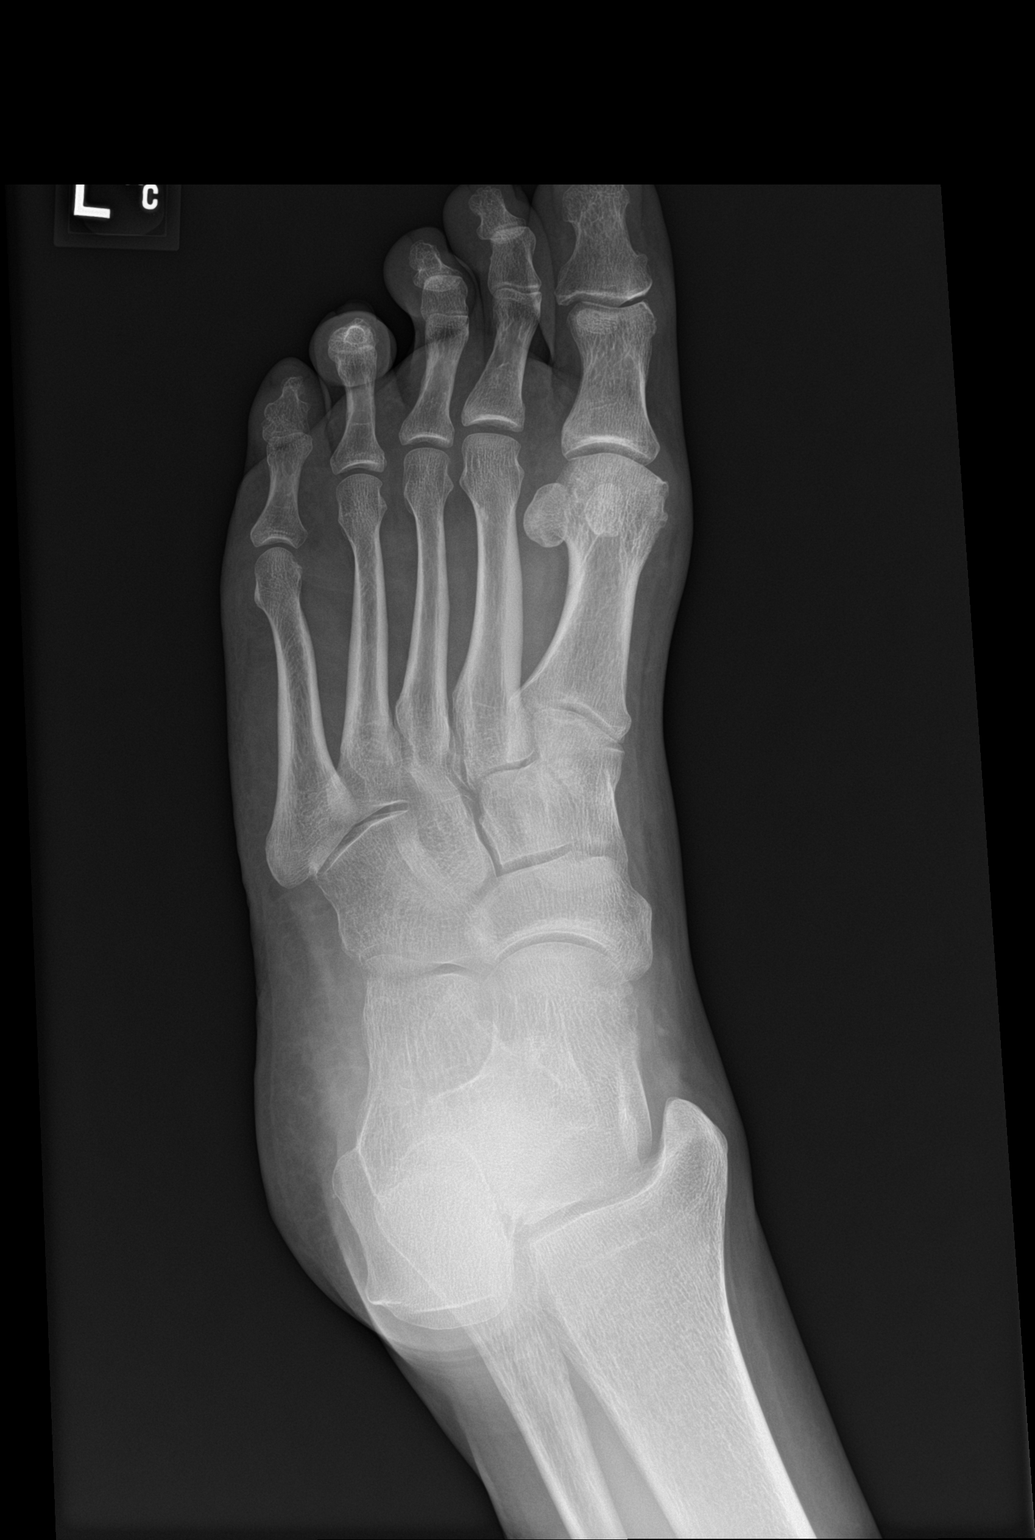

[foot lat]
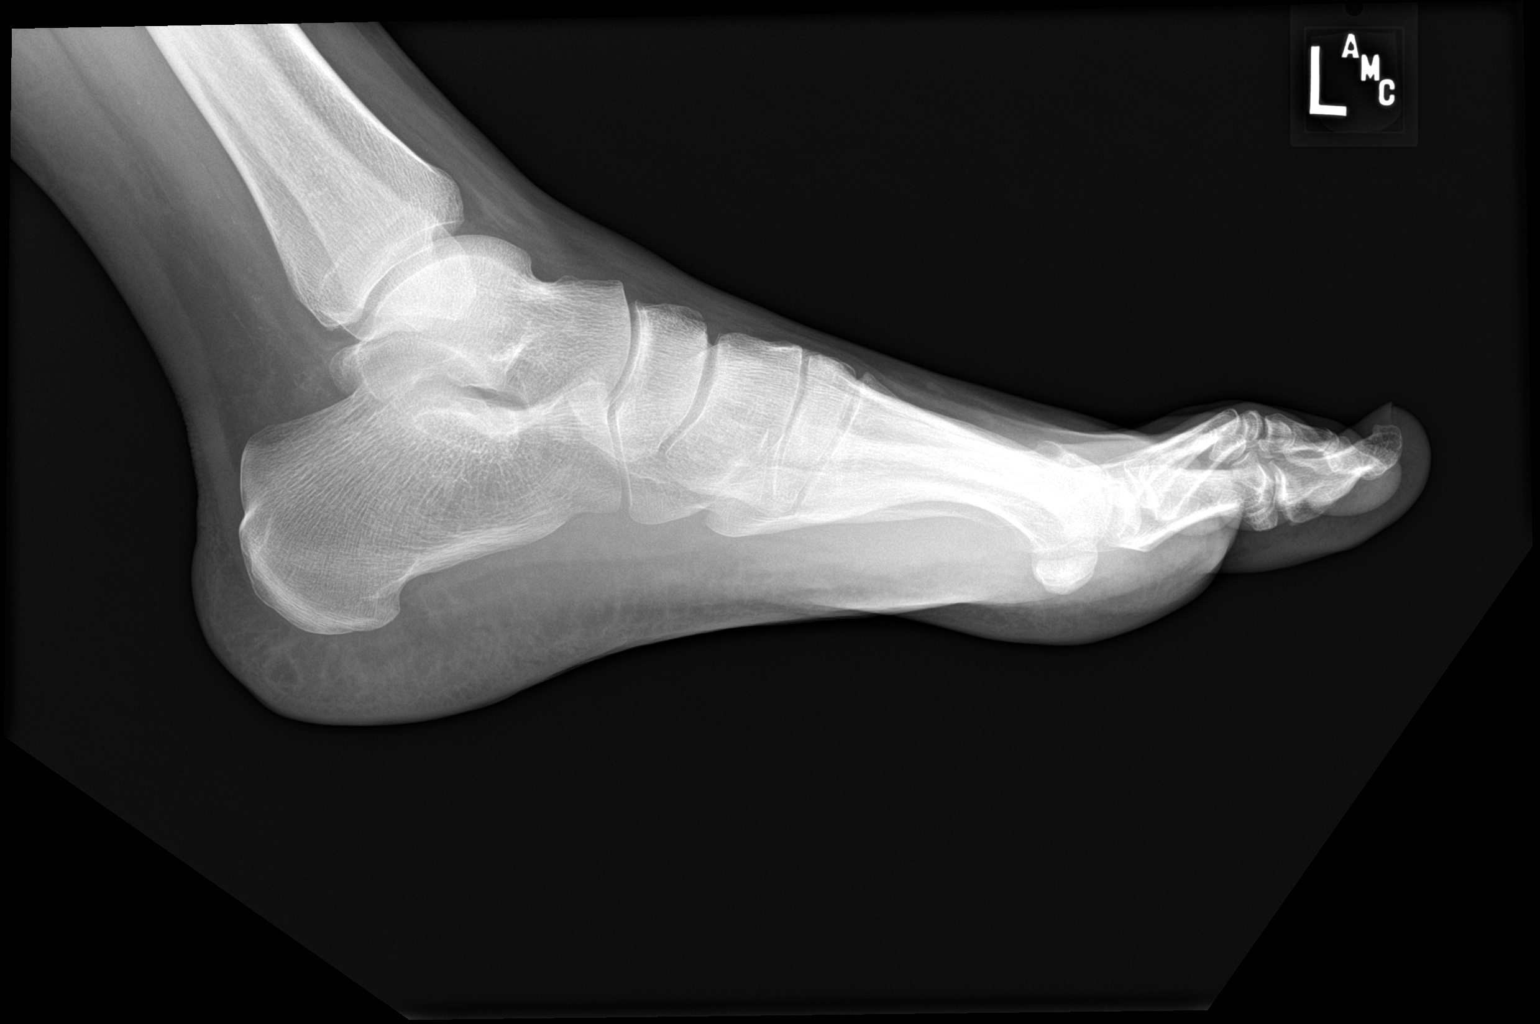

[3 of 3 positions shown; findings below may reference images not displayed]

FINDINGS: No acute bony or joint abnormality identified. No evidence of
fracture dislocation. No radiopaque foreign body.
IMPRESSION: No acute abnormality.

## 2023-02-23 IMAGING — DX DG CHEST 1V PORT
1 series · 1 of 1 positions shown · non-contrast
Comparison: March 22, 2021

CLINICAL DATA: Status post chemo last week for lung cancer.
Presents with shortness of breath and rapid heart rate along with
right-sided chest pain and dizziness.

EXAM:
PORTABLE CHEST 1 VIEW

[chest ap]
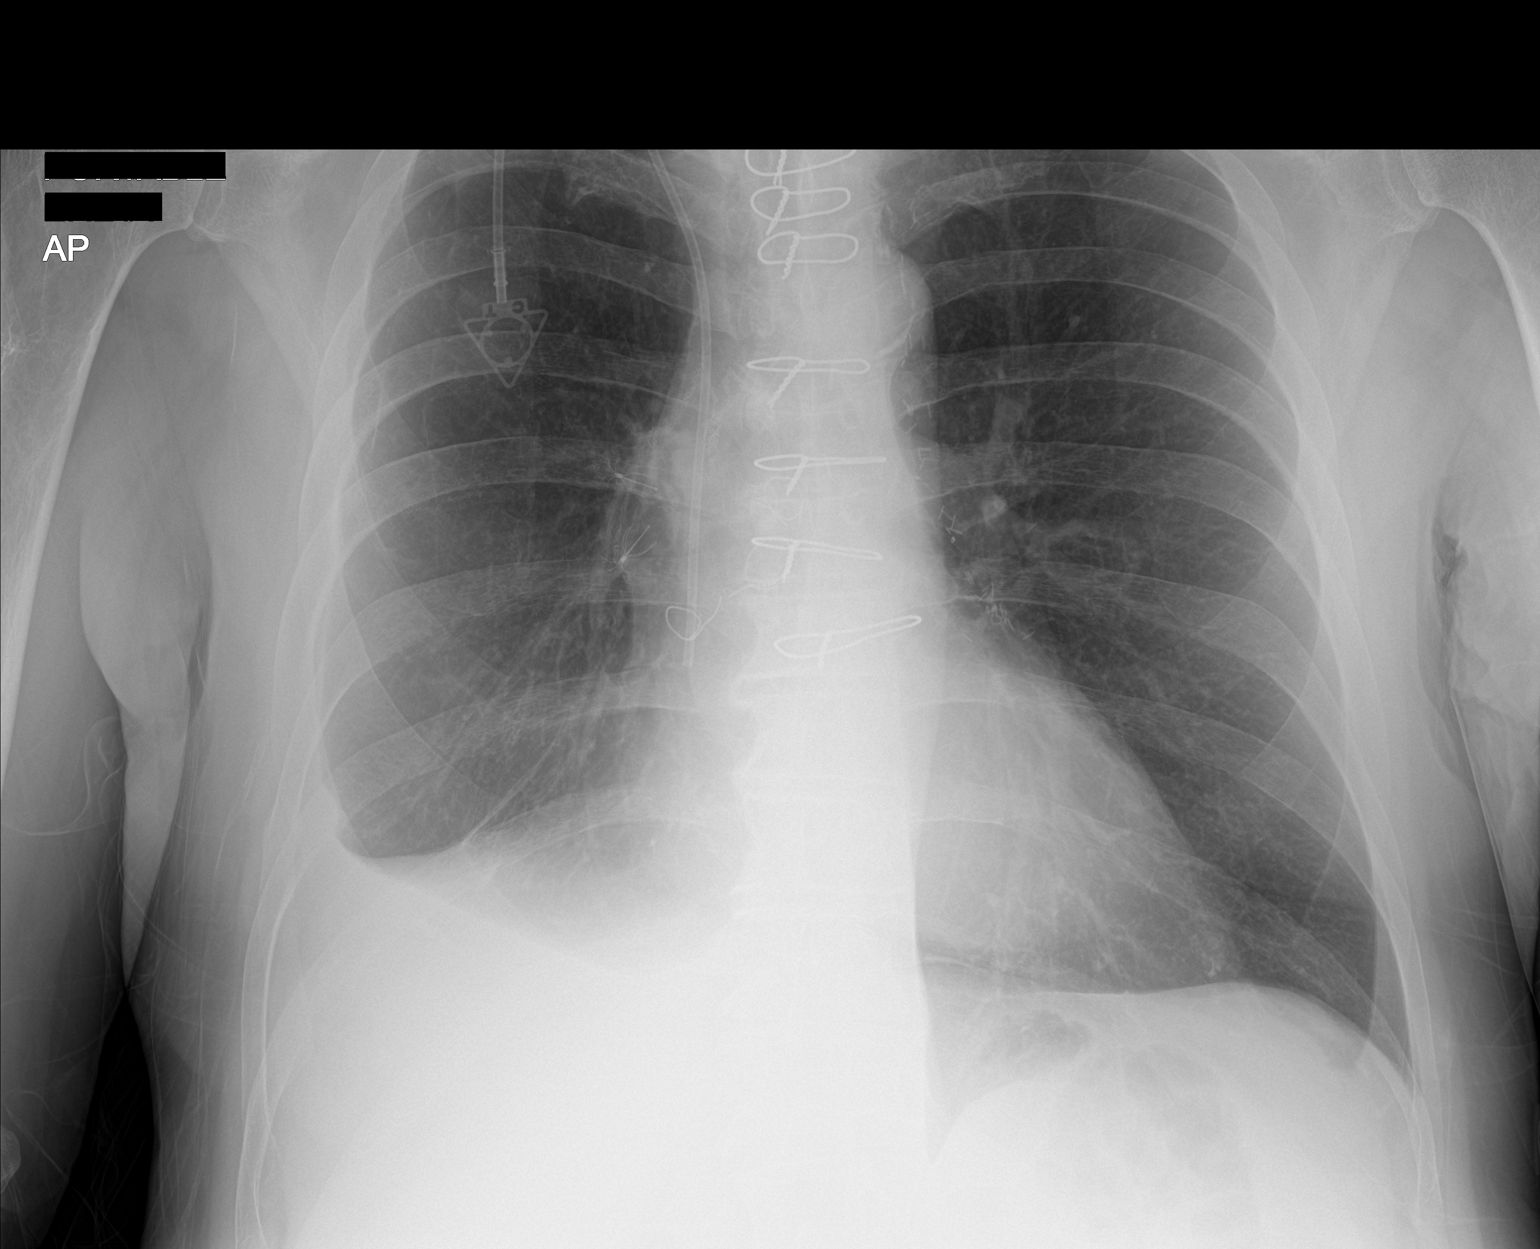

[1 of 1 positions shown; findings below may reference images not displayed]

FINDINGS: There is stable right-sided venous Port-A-Cath positioning. Multiple
sternal wires and vascular clips are noted. Mild atelectasis and/or
scarring is seen within the right lung base. There is a small right
pleural effusion. No pneumothorax is identified. The heart size and
mediastinal contours are within normal limits. A radiopaque fusion
plate and screws are seen overlying the lower cervical spine. The
visualized skeletal structures are otherwise unremarkable.
IMPRESSION: Mild right basilar atelectasis and/or scarring with a small right
pleural effusion.

## 2023-02-24 IMAGING — CT CT ANGIO CHEST
2 of 9 series · 18 of 36 positions shown · IV contrast (omnipaque)
Comparison: Chest CT dated 02/28/2021.

CLINICAL DATA: Concern for pulmonary embolism.

EXAM:
CT ANGIOGRAPHY CHEST WITH CONTRAST
TECHNIQUE: Multidetector CT imaging of the chest was performed using the
standard protocol during bolus administration of intravenous
contrast. Multiplanar CT image reconstructions and MIPs were
obtained to evaluate the vascular anatomy.
CONTRAST:  100mL OMNIPAQUE IOHEXOL 350 MG/ML SOLN

[Series 7: pe thins · axial · 0.78mm/px · z∈[-406,-153]mm · 17 of 283 slices shown]
[im 15/283  lung]
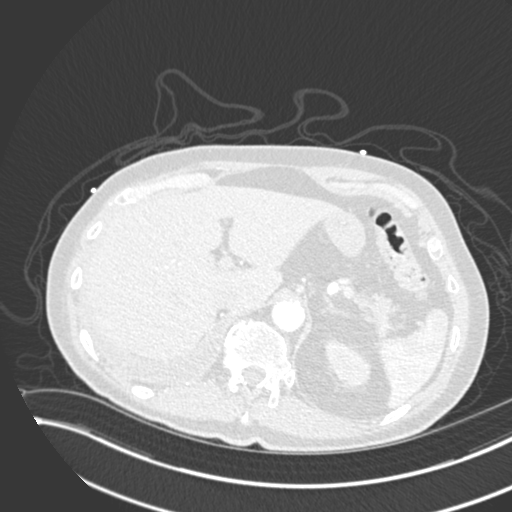
[im 30/283  mediastinal]
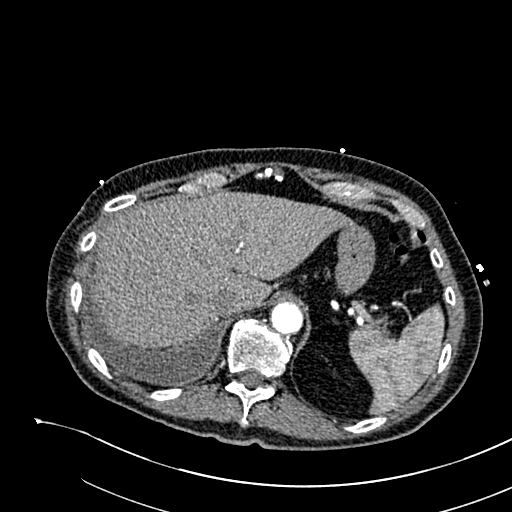
[im 45/283  lung]
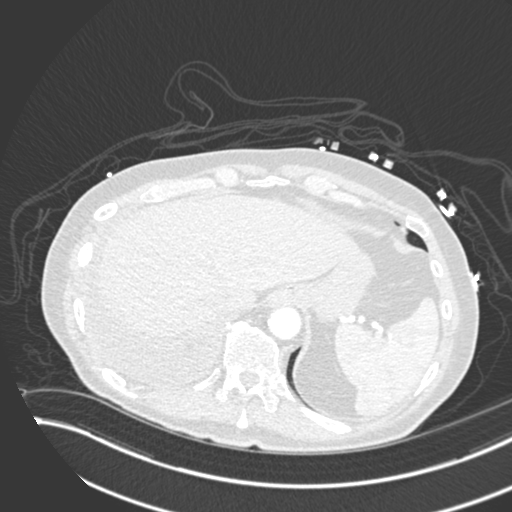
[im 60/283  mediastinal]
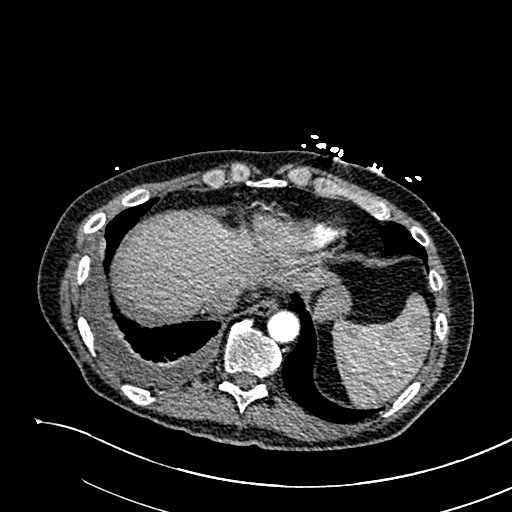
[im 75/283  lung]
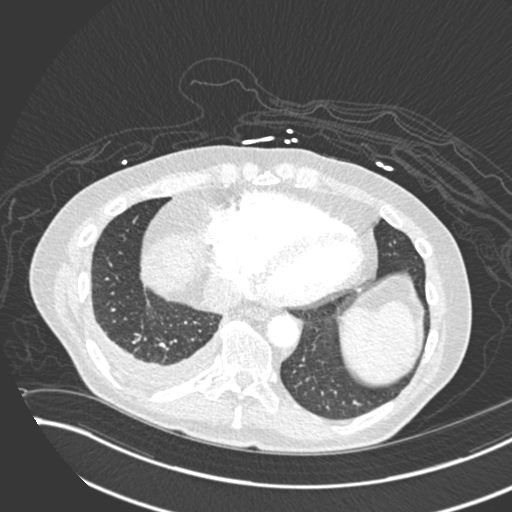
[im 90/283  mediastinal]
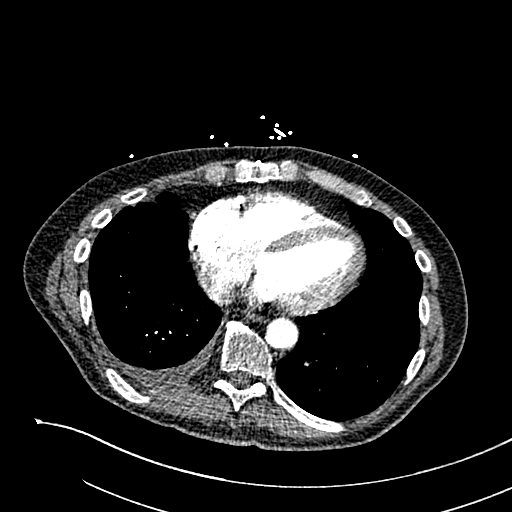
[im 104/283  lung]
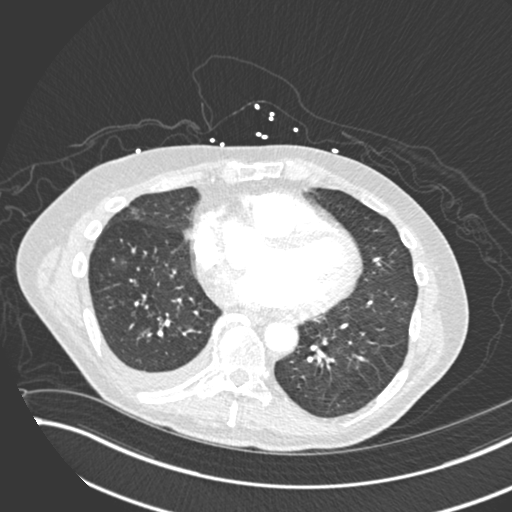
[im 119/283  mediastinal]
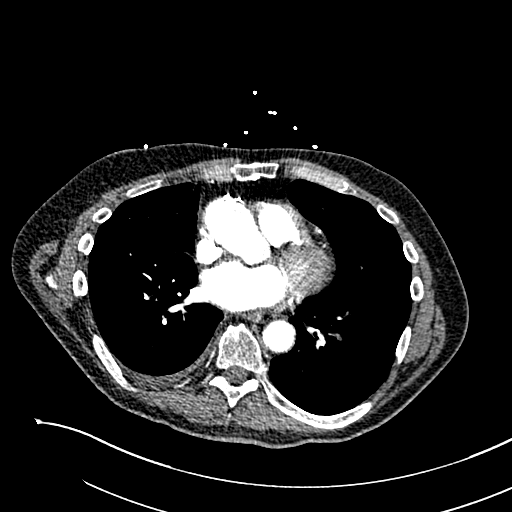
[im 149/283  lung]
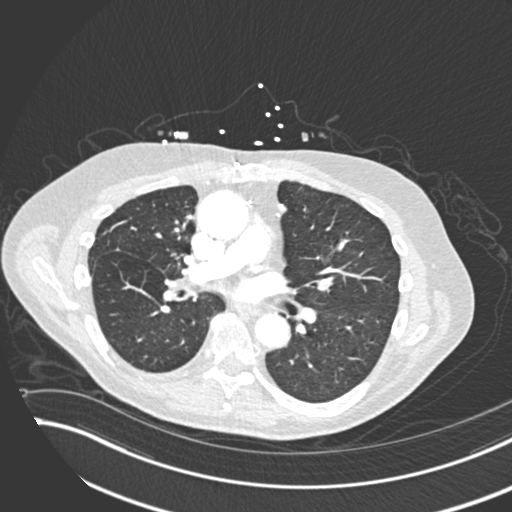
[im 164/283  mediastinal]
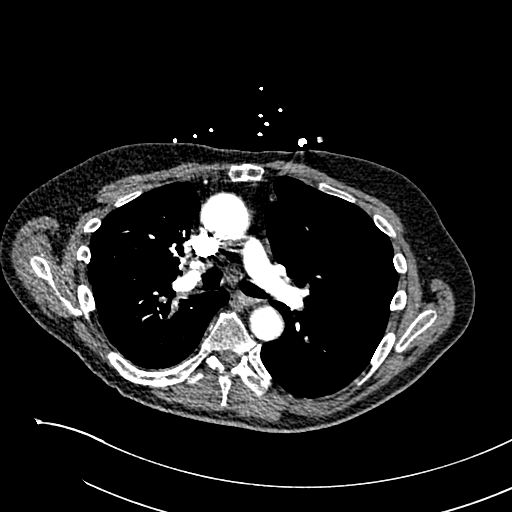
[im 179/283  lung]
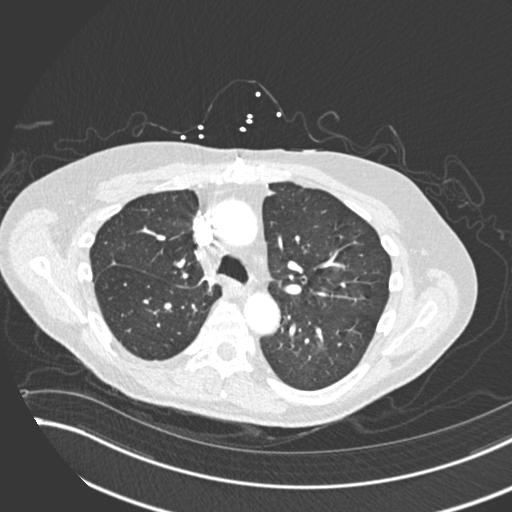
[im 193/283  mediastinal]
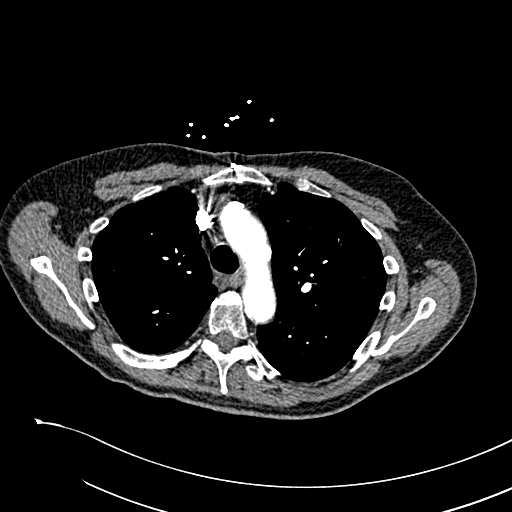
[im 208/283  lung]
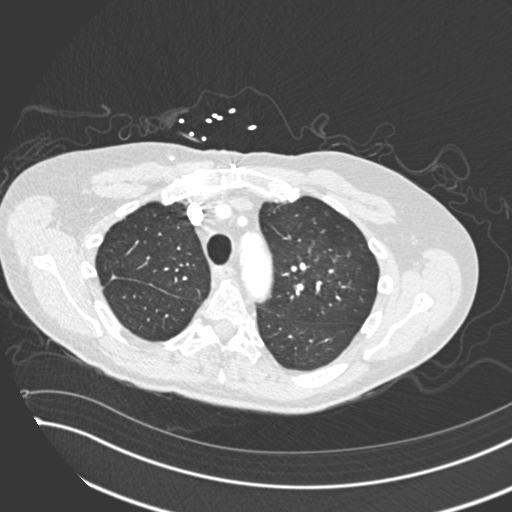
[im 223/283  mediastinal]
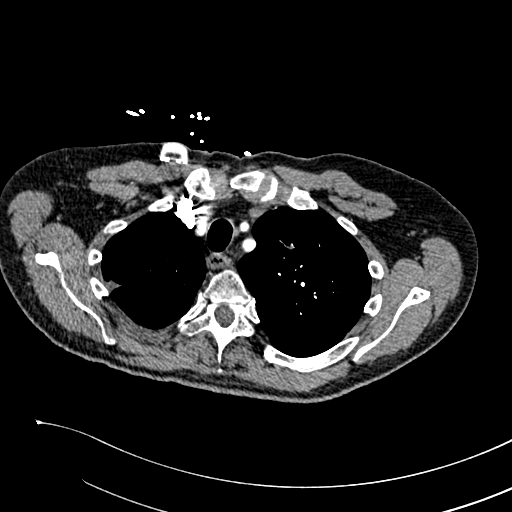
[im 238/283  lung]
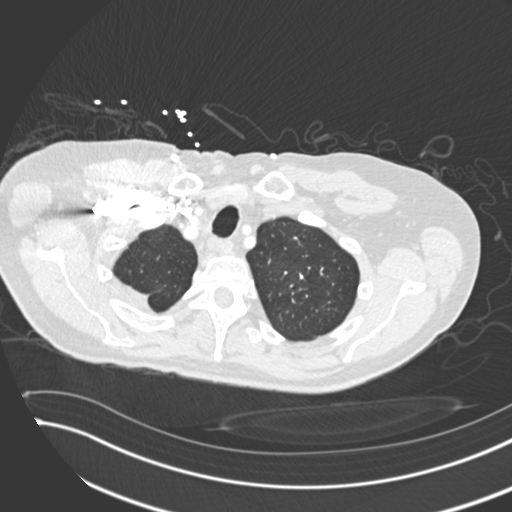
[im 253/283  mediastinal]
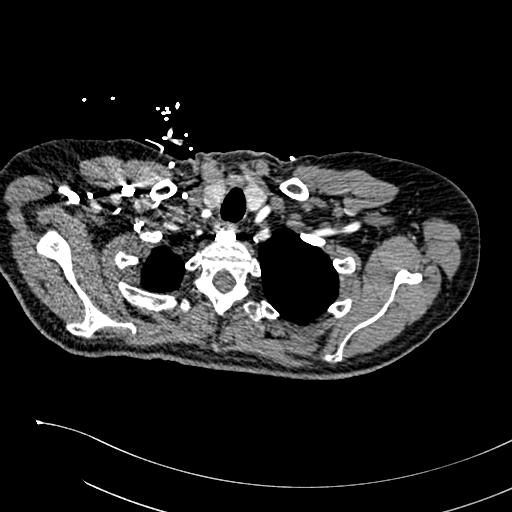
[im 268/283  lung]
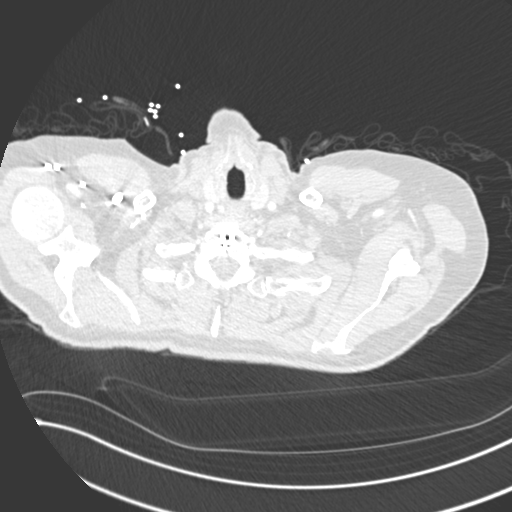

[Series 8: pe coronal mpr · coronal · 0.64mm/px · 1 of 123 slices shown]
[im 62/123  mediastinal]
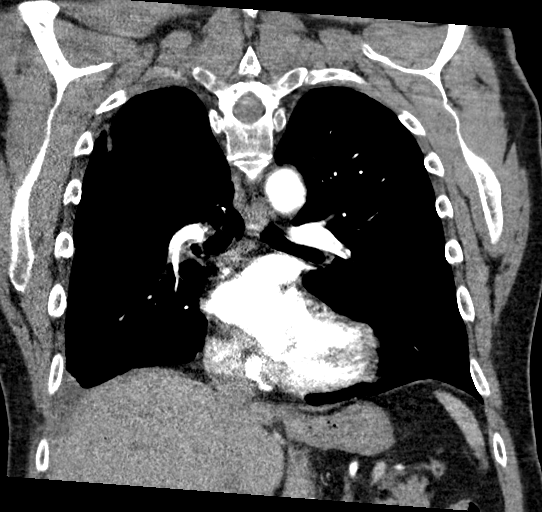

[18 of 36 positions shown; findings below may reference images not displayed]

FINDINGS: Cardiovascular: There is no cardiomegaly or pericardial effusion.
Coronary vascular calcification and postsurgical changes of CABG.
There is mild atherosclerotic calcification of the thoracic aorta.
No aneurysmal dilatation or dissection. The origins of the great
vessels of the aortic arch appear patent as visualized. Evaluation
of the pulmonary arteries is limited due to respiratory motion
artifact. There is linear nonocclusive thrombus within the lobar
branches of the left lower lobe and lingula extending into the
segmental pulmonary artery branches, age indeterminate. Correlation
with history of recent PE recommended. Nonocclusive thrombus is also
noted in the segmental branch of the right lower lobe (121/7). These
findings however are new since the prior CT of 02/28/2021.

Mediastinum/Nodes: No hilar or mediastinal adenopathy. The esophagus
is grossly unremarkable. No mediastinal fluid collection.

Lungs/Pleura: Similar appearance of small right pleural effusion
with partial compressive atelectasis of the right lower lobe.
Pneumonia is not excluded clinical correlation is recommended. Mild
thickening of the bronchial wall of the right lower lobe. No focal
consolidation or pneumothorax. The central airways remain patent.
Postsurgical changes of the right lung as seen previously.

Upper Abdomen: No acute abnormality.

Musculoskeletal: Multilevel degenerative changes with anterior
osteophyte/spurring. C7-T1 ACDF. Median sternotomy wires. No acute
osseous pathology. Old healed right lateral rib fracture.

Review of the MIP images confirms the above findings.
IMPRESSION: 1. Nonocclusive pulmonary emboli within the lobar branches of the
left lower lobe and lingula extending into the segmental pulmonary
artery branches, age indeterminate, but new since the prior CT.
Nonocclusive thrombus is also noted in the segmental branch of the
right lower lobe. No CT evidence of right heart straining.
2. Similar appearance of small right pleural effusion with partial
compressive atelectasis of the right lower lobe. Pneumonia is not
excluded clinical correlation is recommended.
3. Aortic Atherosclerosis ([id number]).

These results were called by telephone at the time of interpretation
on 04/27/2021 at [DATE] to Dr. Kirke-Liis, who verbally acknowledged
these results.

## 2023-02-25 IMAGING — DX DG CHEST 1V PORT
1 series · 1 of 1 positions shown · non-contrast
Comparison: April 26, 2021.

CLINICAL DATA: Shortness of breath.

EXAM:
PORTABLE CHEST 1 VIEW

[chest ap]
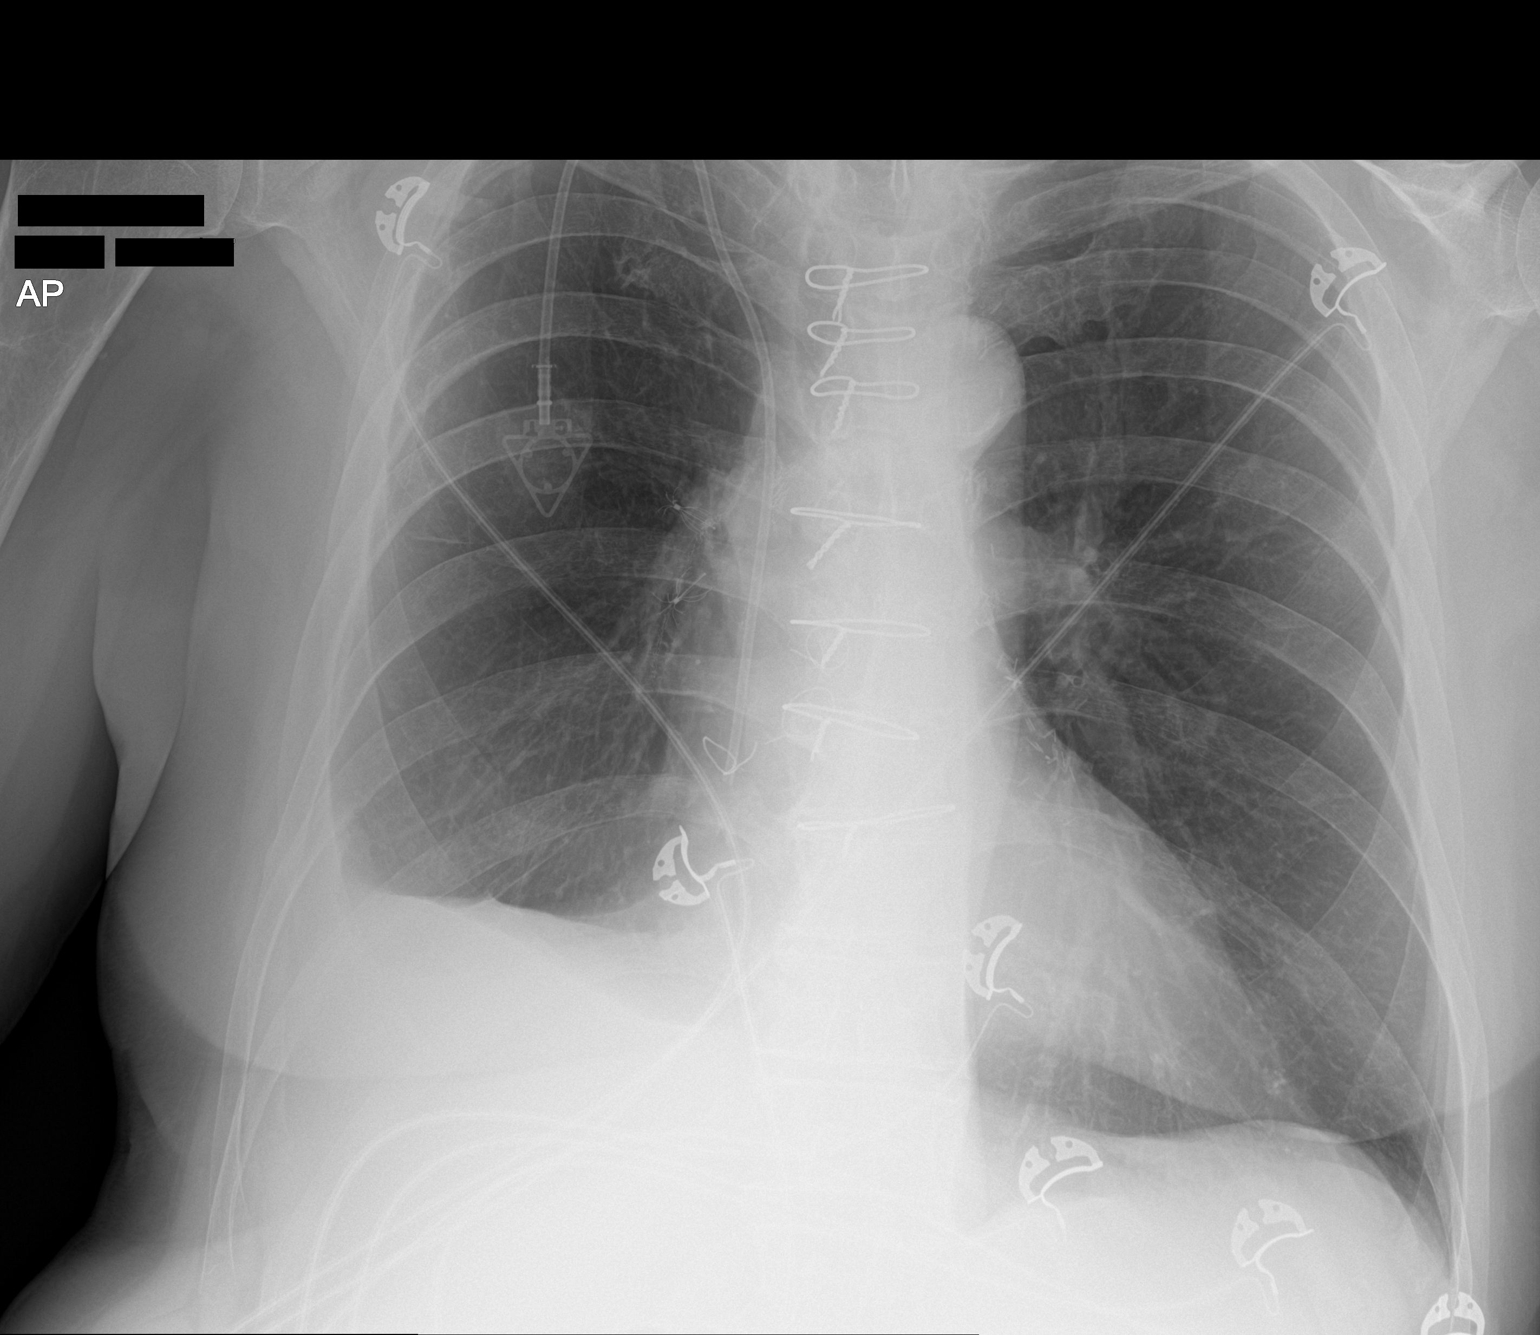

[1 of 1 positions shown; findings below may reference images not displayed]

FINDINGS: The heart size and mediastinal contours are within normal limits.
Status post coronary artery bypass graft. Right internal jugular
Port-A-Cath is unchanged in position left lung is clear. No
pneumothorax is noted. Stable right pleural effusion is noted with
associated atelectasis. The visualized skeletal structures are
unremarkable.
IMPRESSION: Stable mild right pleural effusion with associated right basilar
atelectasis or scarring.

## 2023-03-03 IMAGING — CR DG CHEST 2V
2 series · 2 of 2 positions shown · non-contrast
Comparison: 04/28/2021

CLINICAL DATA: Dyspnea, nausea, vomiting

EXAM:
CHEST - 2 VIEW

[w chest pa]
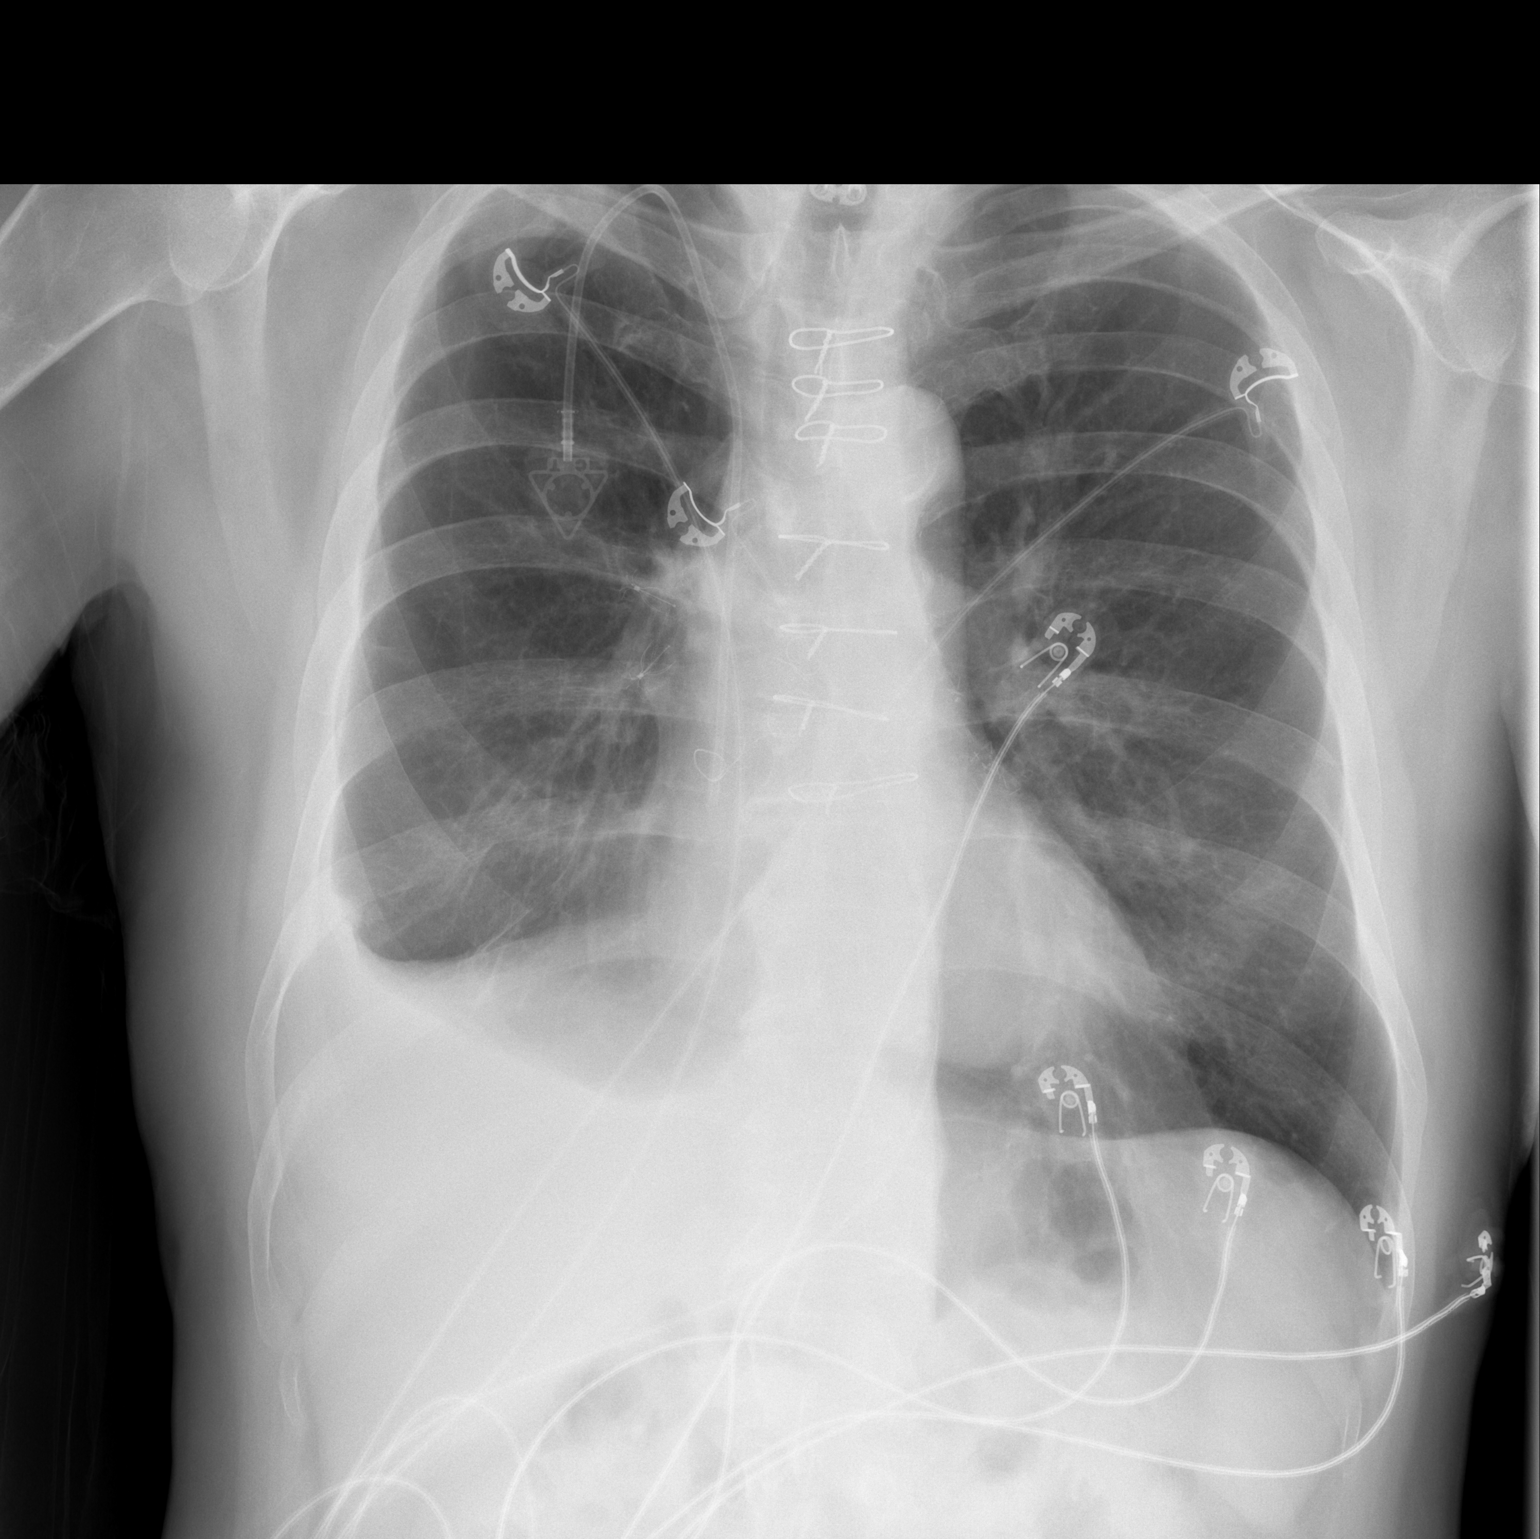

[w chest lat]
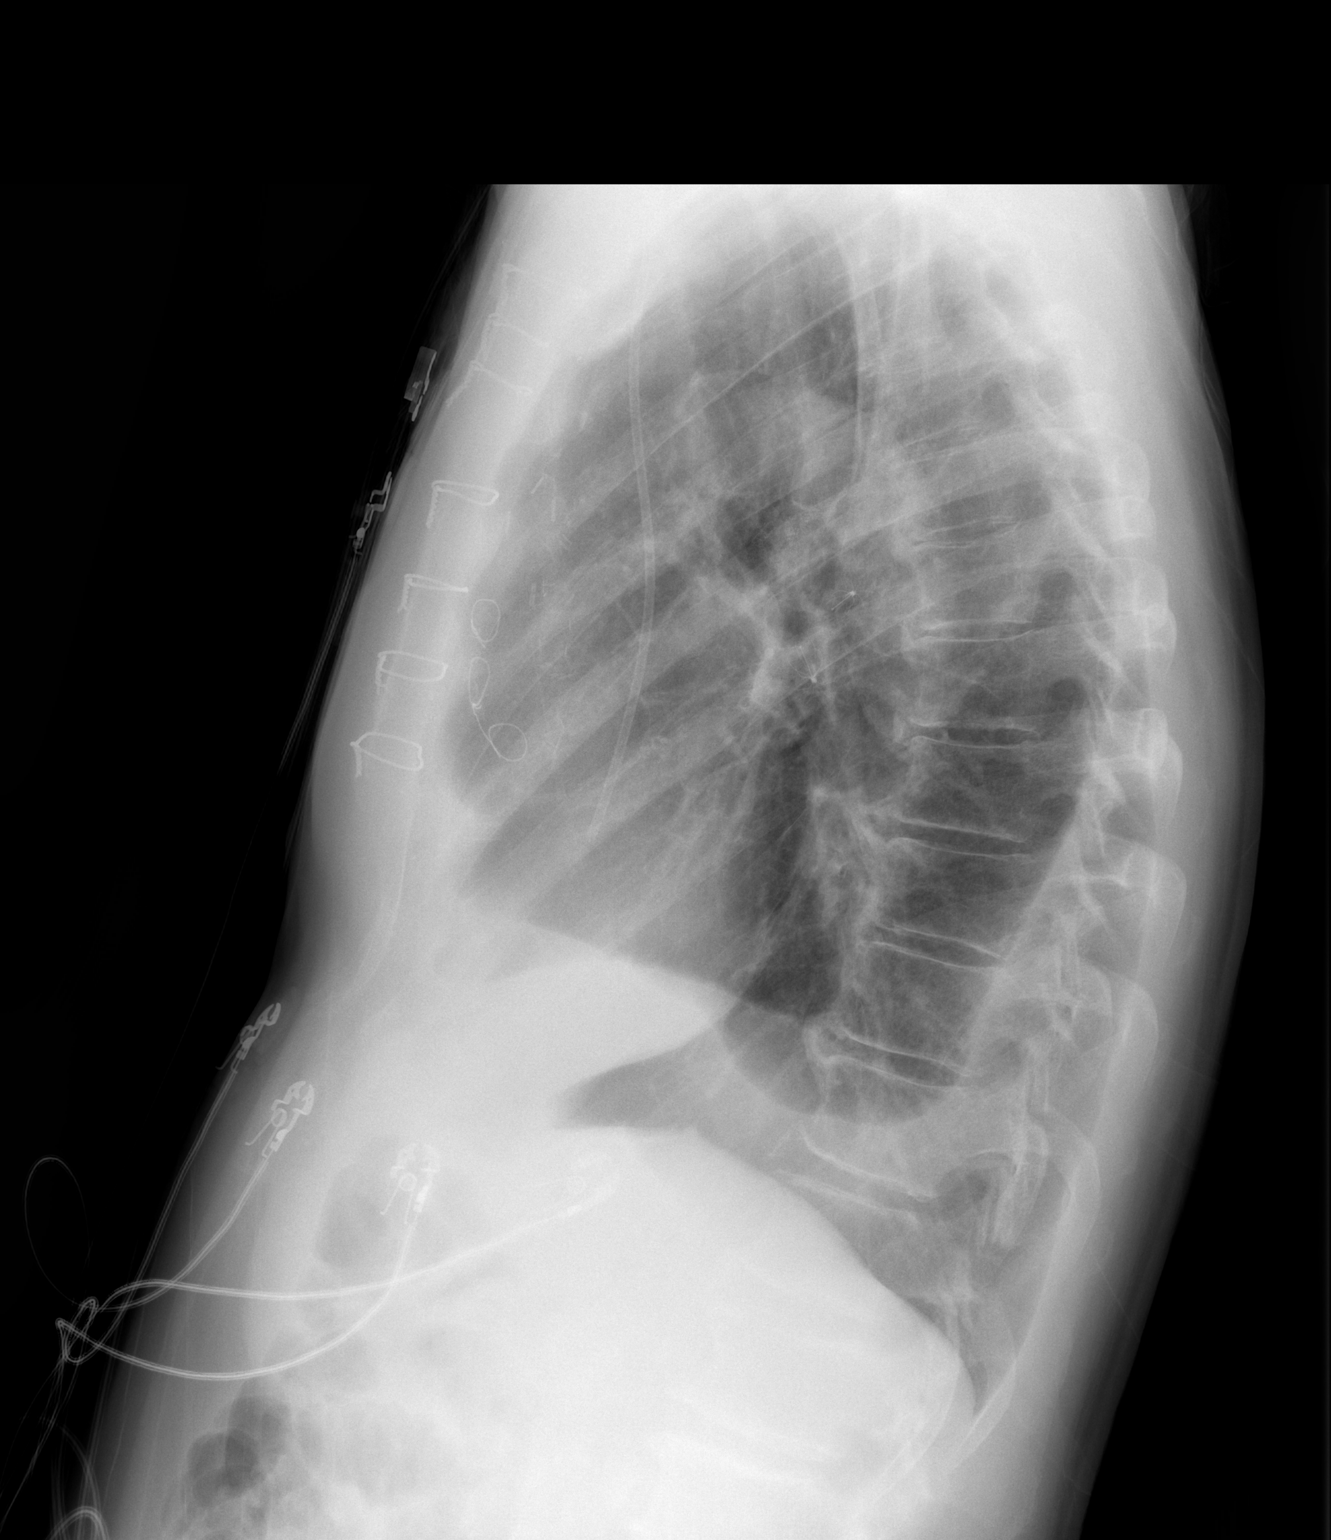

[2 of 2 positions shown; findings below may reference images not displayed]

FINDINGS: Small right pleural effusion is stable. Right-sided volume loss is
unchanged left lung is clear. No pneumothorax. No pleural effusion
on the left. Filter like foreign bodies are seen within the right
hilum, unchanged from multiple prior examinations, possibly
representing bronchial plugs. Coronary artery bypass grafting has
been performed. Cardiac size within normal limits. Right internal
jugular chest port is seen with its tip within the superior right
atrium. No acute bone abnormality.
IMPRESSION: Stable small right pleural effusion.

Unchanged right-sided volume loss secondary to right upper
lobectomy.

## 2023-03-04 IMAGING — CT CT ABD-PELV W/ CM
2 of 5 series · 15 of 46 positions shown, 17 images · IV contrast (Omnipaque)
Comparison: CT Chest, Abdomen, and Pelvis 02/14/2021, and earlier

CLINICAL DATA: 65-year-old male with abdominal pain. Nausea.
Epistaxis. History of lung cancer.

EXAM:
CT ABDOMEN AND PELVIS WITH CONTRAST
TECHNIQUE: Multidetector CT imaging of the abdomen and pelvis was performed
using the standard protocol following bolus administration of
intravenous contrast.
CONTRAST:  85mL OMNIPAQUE IOHEXOL 350 MG/ML SOLN

[Series 2: axial st · axial · 0.93mm/px · z∈[-579,-69]mm · 12 of 114 slices shown, 14 images]
[im 6/114  soft-tissue]
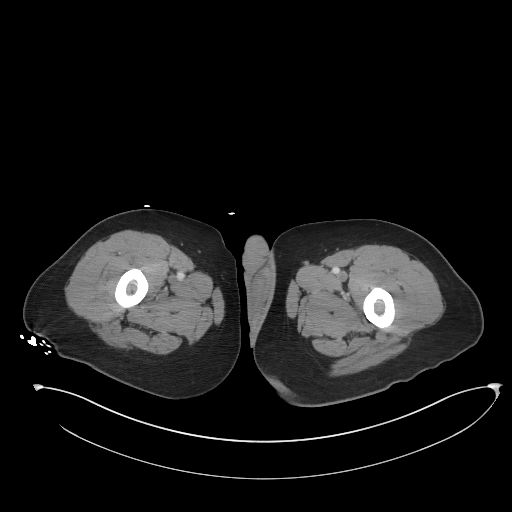
[im 6/114  bone]
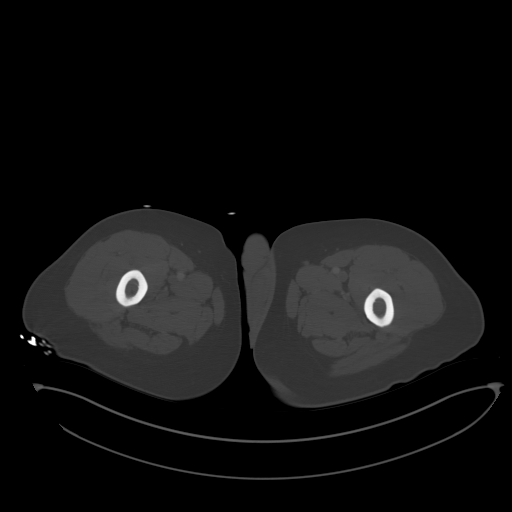
[im 18/114  soft-tissue]
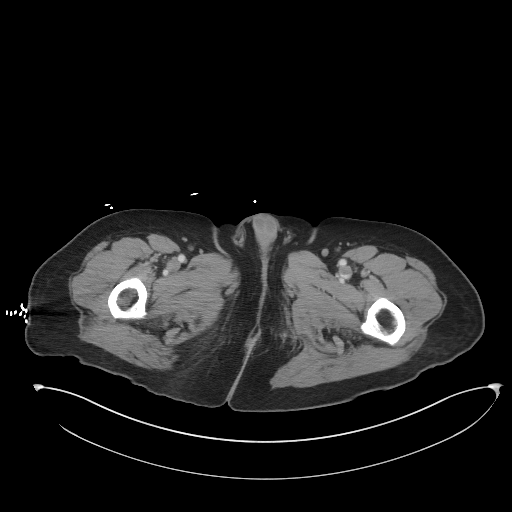
[im 24/114  soft-tissue]
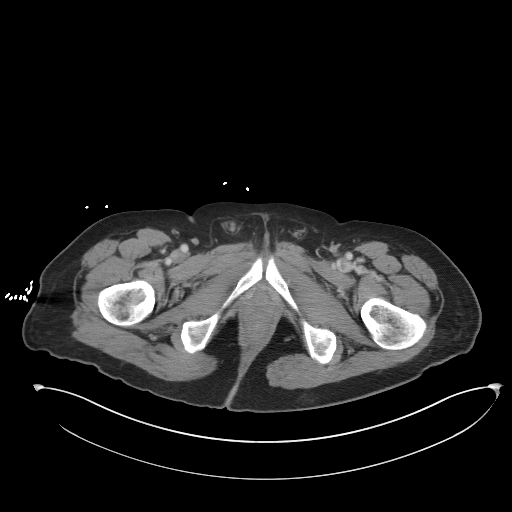
[im 36/114  soft-tissue]
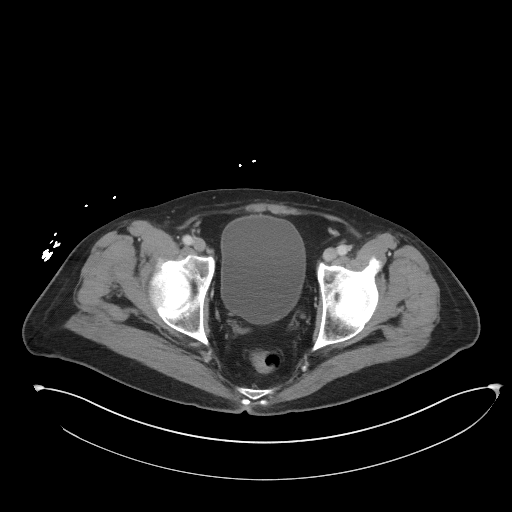
[im 42/114  soft-tissue]
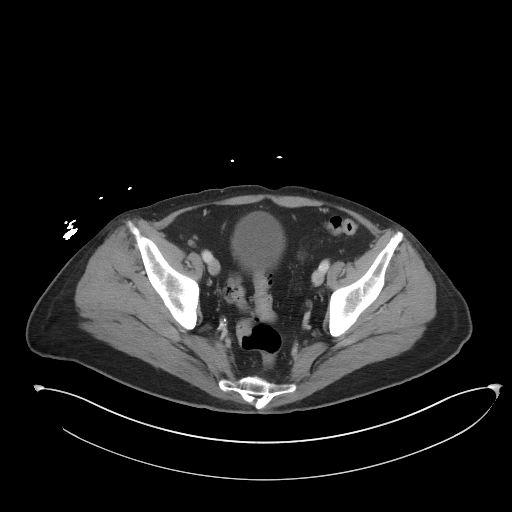
[im 54/114  soft-tissue]
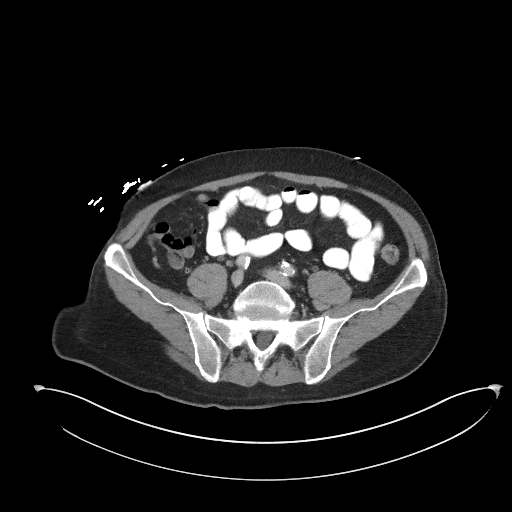
[im 60/114  soft-tissue]
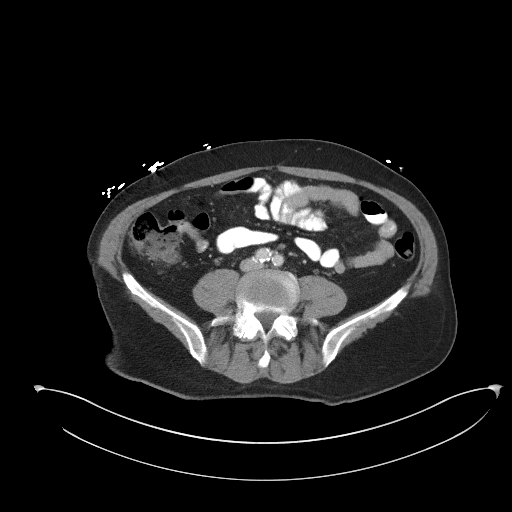
[im 72/114  soft-tissue]
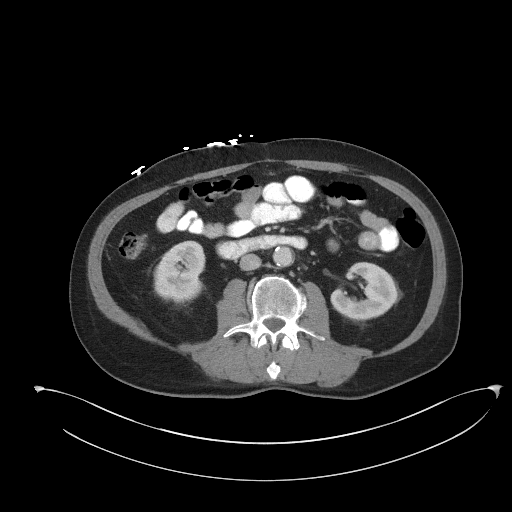
[im 78/114  soft-tissue]
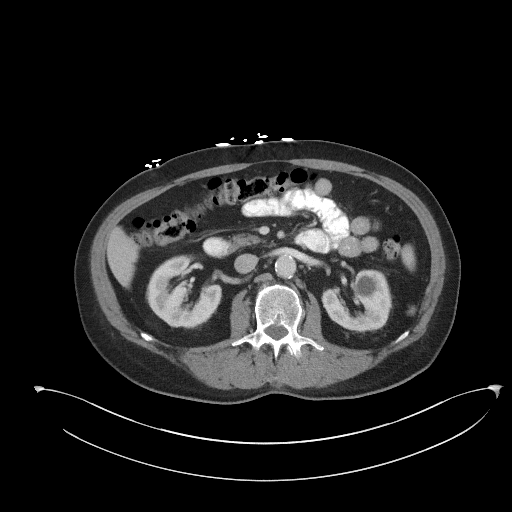
[im 78/114  bone]
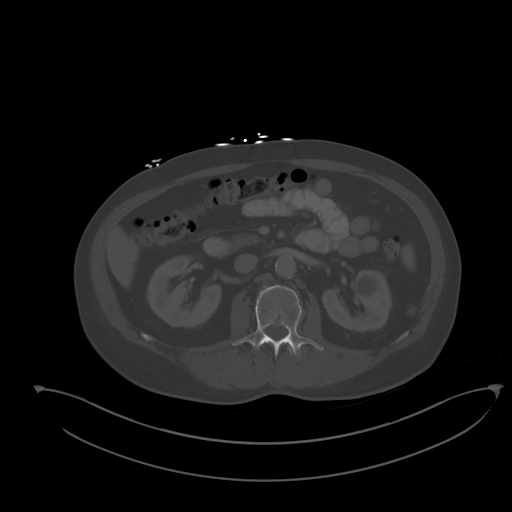
[im 90/114  soft-tissue]
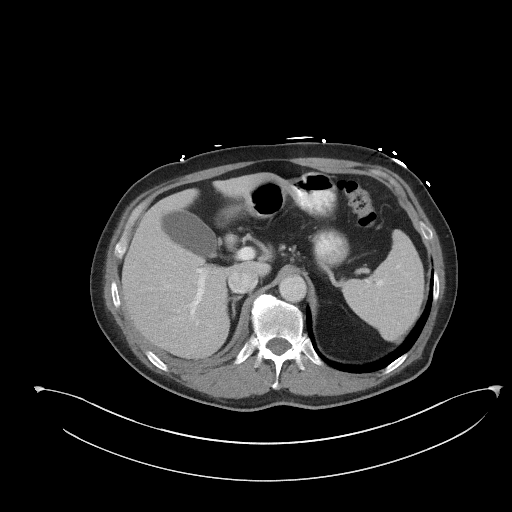
[im 96/114  soft-tissue]
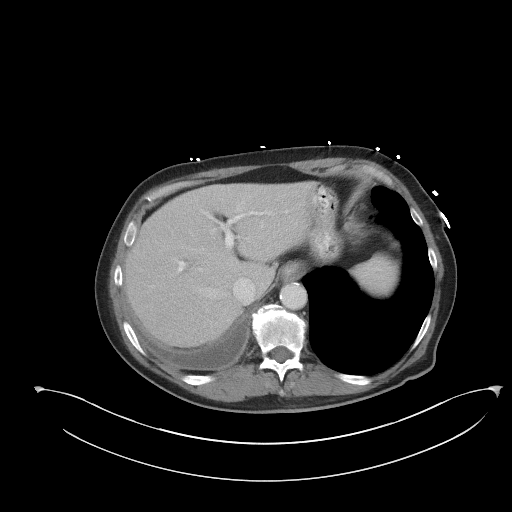
[im 108/114  soft-tissue]
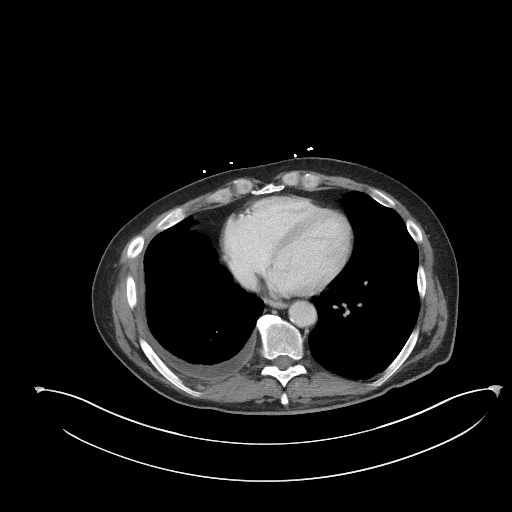

[Series 5: coronal st · coronal · 0.78mm/px · 3 of 87 slices shown]
[im 29/87  soft-tissue]
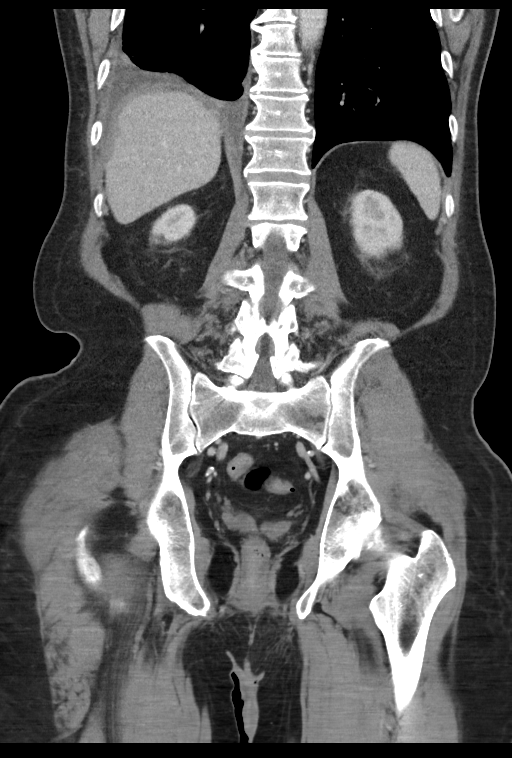
[im 39/87  soft-tissue]
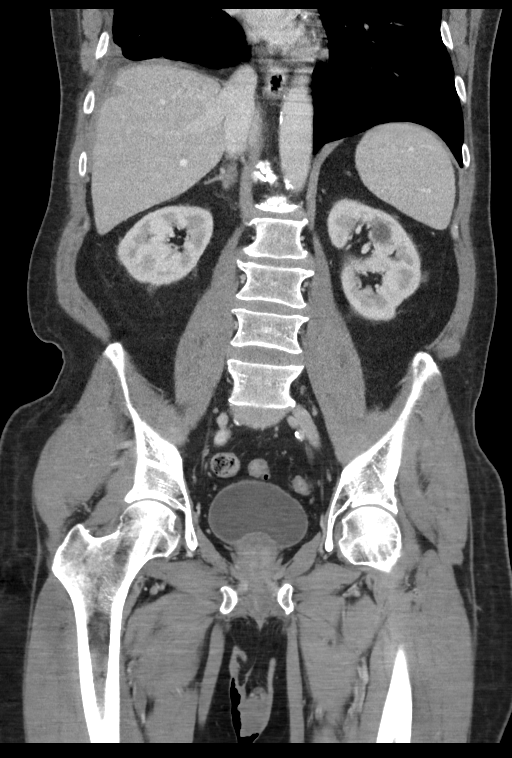
[im 48/87  soft-tissue]
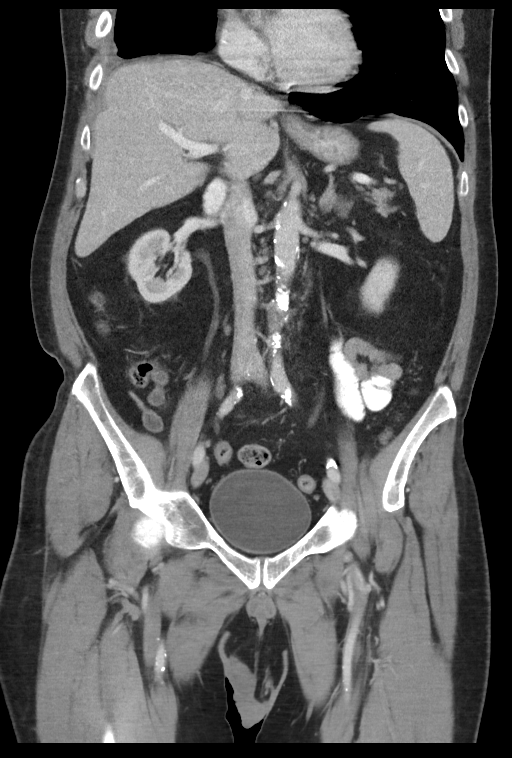

[15 of 46 positions shown; findings below may reference images not displayed]

FINDINGS: Lower chest: Small right lung base pleural effusion with thickened
and enhancing pleura is mildly larger since [REDACTED]. No associated
adjacent right lung base airspace disease. Left lung base is stable
and negative. No pericardial effusion.

Hepatobiliary: A tiny low-density area at the liver dome on series
2, image 12 is stable since at least 2323 and appears benign. Liver
enhancement elsewhere remains normal. Negative gallbladder. No bile
duct enlargement.

Pancreas: Negative.

Spleen: Negative, small splenules (normal variant).

Adrenals/Urinary Tract: Normal adrenal glands. Nonobstructed kidneys
enhance symmetrically. And there is symmetric renal contrast
excretion on the delayed images. Chronic and benign left renal
midpole cyst. Punctate right upper pole nephrolithiasis. Both
ureters are decompressed and normal. Unremarkable bladder.
Incidental pelvic phleboliths.

Stomach/Bowel: Mild diverticulosis in the sigmoid colon. Otherwise
negative large bowel. No active inflammation. Normal appendix on
series 2, image 61. Oral contrast has not yet reached the terminal
ileum. But there is no dilated small bowel. Opacified small bowel
loops appear unremarkable. There is a small volume of residual oral
contrast in the stomach and duodenum which appear normal. No free
air or free fluid.

Vascular/Lymphatic: Aortoiliac calcified atherosclerosis. Major
arterial structures are patent. Portal venous system is patent.
Small abdominal lymph nodes are stable. No lymphadenopathy.

Reproductive: Negative.

Other: No pelvic free fluid.

Musculoskeletal: Prior sternotomy. Chronic right lateral 8th rib
fracture is stable. Chronic left L5 pars fracture. No significant
spondylolisthesis. No acute or suspicious osseous lesion identified.
IMPRESSION: 1. No acute or inflammatory process identified in the abdomen or
pelvis. Normal appendix.
2. A small right lung base complex pleural effusion with thickened
pleura is mildly larger since [DATE]. Aortic Atherosclerosis ([id number]-HR2.2).

## 2023-03-14 IMAGING — MR MR HEAD WO/W CM
12 series · 48 of 48 positions shown · IV contrast (multihance)
Comparison: 02/04/2021.

CLINICAL DATA: Metastatic disease follow-up

EXAM:
MRI HEAD WITHOUT AND WITH CONTRAST
TECHNIQUE: Multiplanar, multiecho pulse sequences of the brain and surrounding
structures were obtained without and with intravenous contrast.
CONTRAST:  15mL MULTIHANCE GADOBENATE DIMEGLUMINE 529 MG/ML IV SOLN

[Series 3: FLAIR · sagittal · 3.0mm · 0.75mm/px · 2 of 39 slices shown (1 of 2)]
[im 1/39]
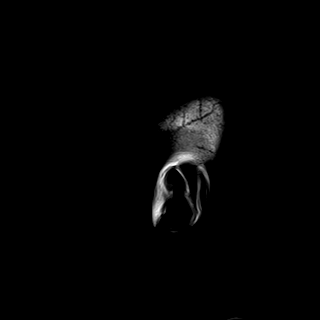
[im 39/39]
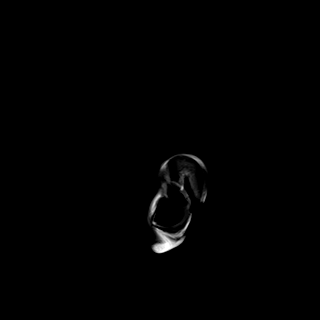

[Series 4: DWI · axial · 3.0mm · 1.50mm/px · z∈[-91,+61]mm · 4 of 82 slices shown (1 of 2)]
[im 1/82]
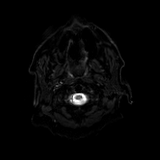
[im 28/82]
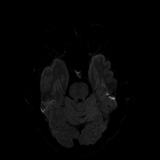
[im 55/82]
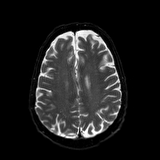
[im 82/82]
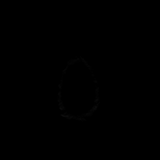

[Series 5: DWI · axial · 3.0mm · 1.50mm/px · z∈[-91,+61]mm · 2 of 41 slices shown (2 of 2)]
[im 1/41]
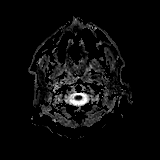
[im 41/41]
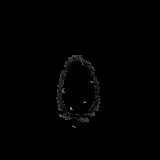

[Series 6: T2 · axial · 5.0mm · 0.57mm/px · 1 of 27 slices shown (1 of 2)]
[im 1/27]
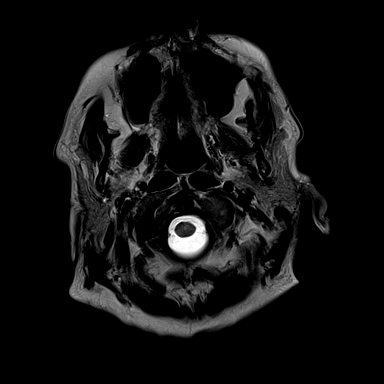

[Series 7: swi_images · axial · 1.5mm · 0.90mm/px · z∈[-90,+60]mm · 5 of 104 slices shown]
[im 1/104]
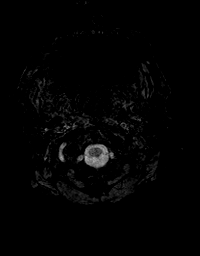
[im 26/104]
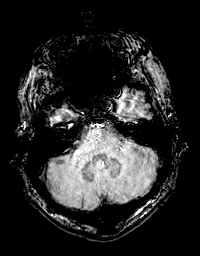
[im 52/104]
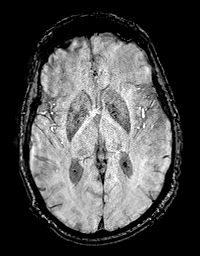
[im 78/104]
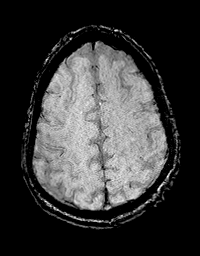
[im 104/104]
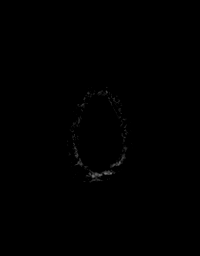

[Series 8: mip_images(sw) · axial · 12.0mm · 0.90mm/px · z∈[-85,+55]mm · 4 of 97 slices shown]
[im 1/97]
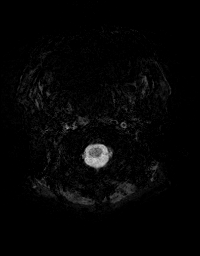
[im 33/97]
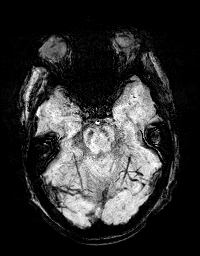
[im 65/97]
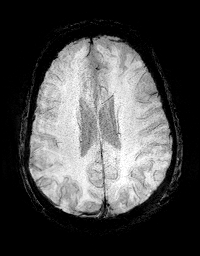
[im 97/97]
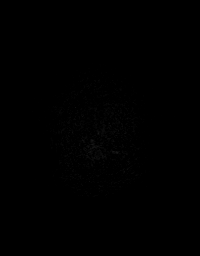

[Series 9: FLAIR · axial · 3.0mm · 0.86mm/px · z∈[-136,+56]mm · 3 of 65 slices shown (2 of 2)]
[im 1/65]
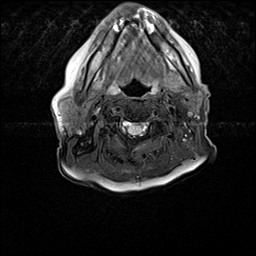
[im 33/65]
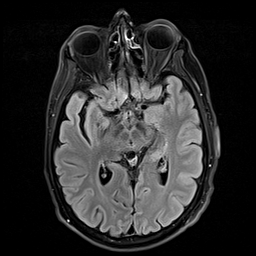
[im 65/65]
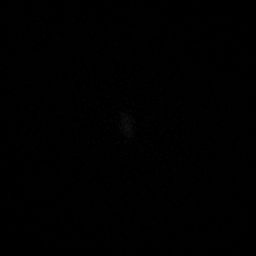

[Series 10: T2 · axial · non-contrast · 1.0mm · 0.90mm/px · z∈[-113,+58]mm · 8 of 176 slices shown (2 of 2)]
[im 1/176]
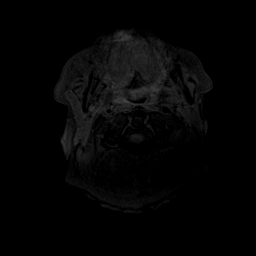
[im 26/176]
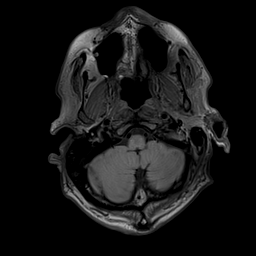
[im 51/176]
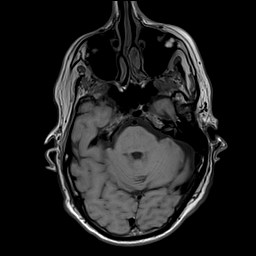
[im 76/176]
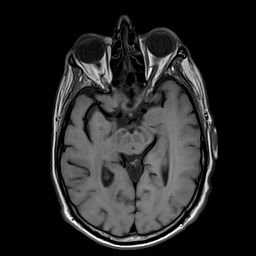
[im 101/176]
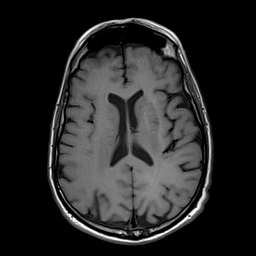
[im 126/176]
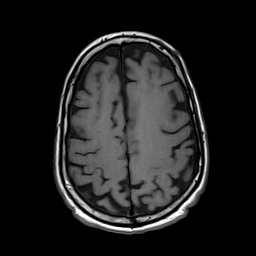
[im 151/176]
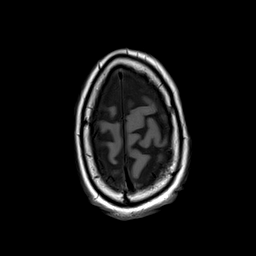
[im 176/176]
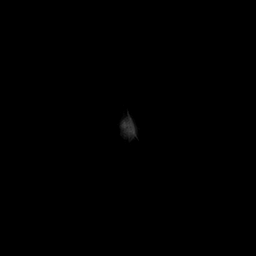

[Series 11: T2 post-contrast · coronal · 3.0mm · 0.57mm/px · 2 of 47 slices shown (1 of 2)]
[im 1/47]
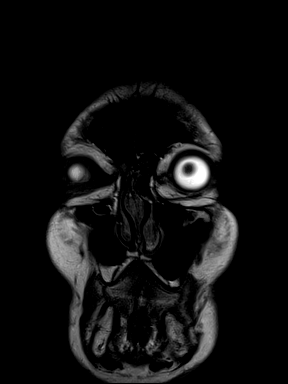
[im 47/47]
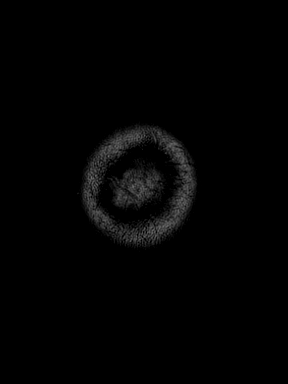

[Series 12: T2 post-contrast · axial · 1.0mm · 0.90mm/px · z∈[-113,+58]mm · 8 of 176 slices shown (2 of 2)]
[im 1/176]
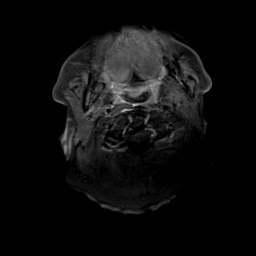
[im 26/176]
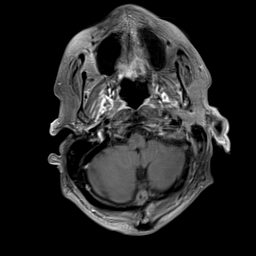
[im 51/176]
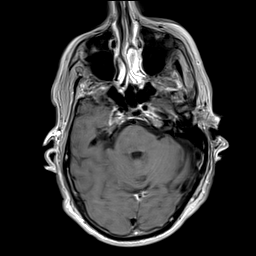
[im 76/176]
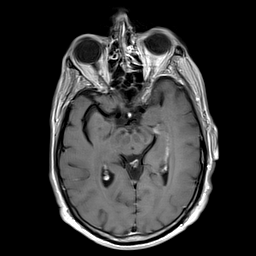
[im 101/176]
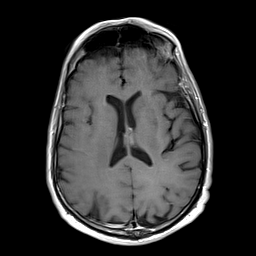
[im 126/176]
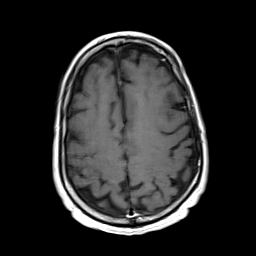
[im 151/176]
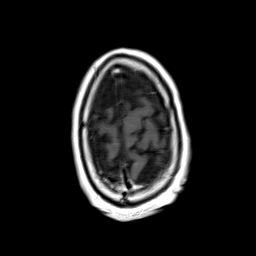
[im 176/176]
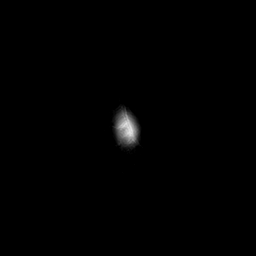

[Series 13: T1 post-contrast · axial · 1.0mm · 0.75mm/px · z∈[-107,+52]mm · 7 of 160 slices shown (1 of 2)]
[im 1/160]
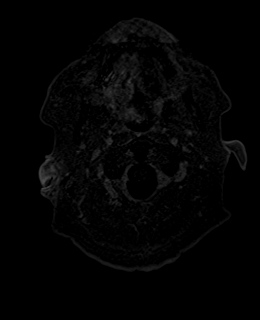
[im 27/160]
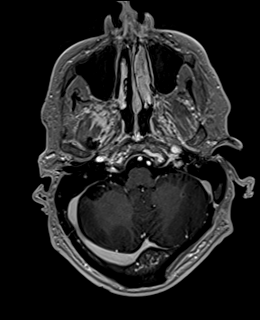
[im 54/160]
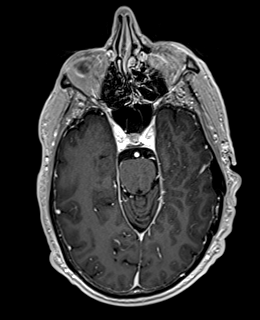
[im 80/160]
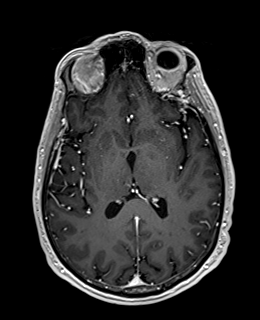
[im 107/160]
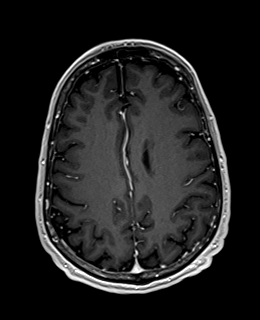
[im 133/160]
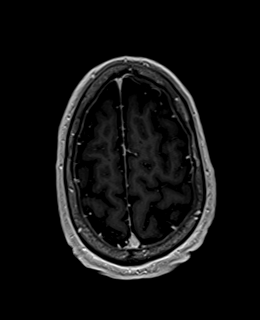
[im 160/160]
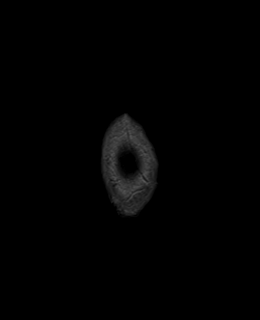

[Series 14: T1 post-contrast · coronal · 3.0mm · 0.57mm/px · 2 of 47 slices shown (2 of 2)]
[im 1/47]
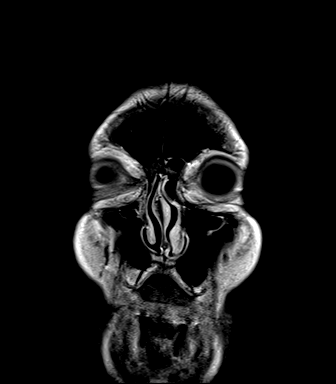
[im 47/47]
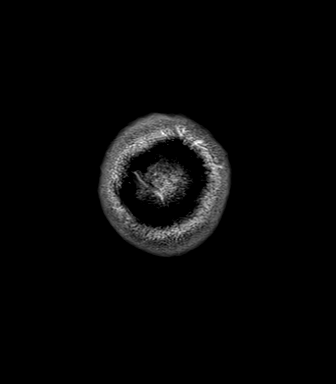

[48 of 48 positions shown; findings below may reference images not displayed]

FINDINGS: Brain: Previously noted enhancing lesion in the posterior left
frontal lobe is no longer seen (series 13, image 140). Significant
interval decrease in the size of the left cerebellar lesion, now
measuring approximately 2 mm (series 13, image 60), previously 7 mm.
Decreased vasogenic edema around the left cerebellar lesion. No new
enhancing lesions.

No acute infarct, cerebral edema, hemorrhage, mass effect, or
midline shift. No hydrocephalus.

Vascular: Normal flow voids.

Skull and upper cervical spine: Normal marrow signal.

Sinuses/Orbits: Negative.

Other: None.
IMPRESSION: 1. Interval decrease in the size of a left cerebellar metastasis,
with decreased surrounding edema.
2. Previously noted enhancing lesion in the left frontal lobe is no
longer seen.
3. No new enhancing lesions to suggest additional metastatic
disease.

## 2023-04-03 IMAGING — DX DG CHEST 1V PORT
1 series · 1 of 1 positions shown · non-contrast
Comparison: CT Abdomen and Pelvis 05/05/2021 and earlier.

CLINICAL DATA: 65-year-old male with lung cancer. Coughing up blood
for 1 week. Rib pain. Diarrhea. Denies fever.

EXAM:
PORTABLE CHEST 1 VIEW

[chest ap]
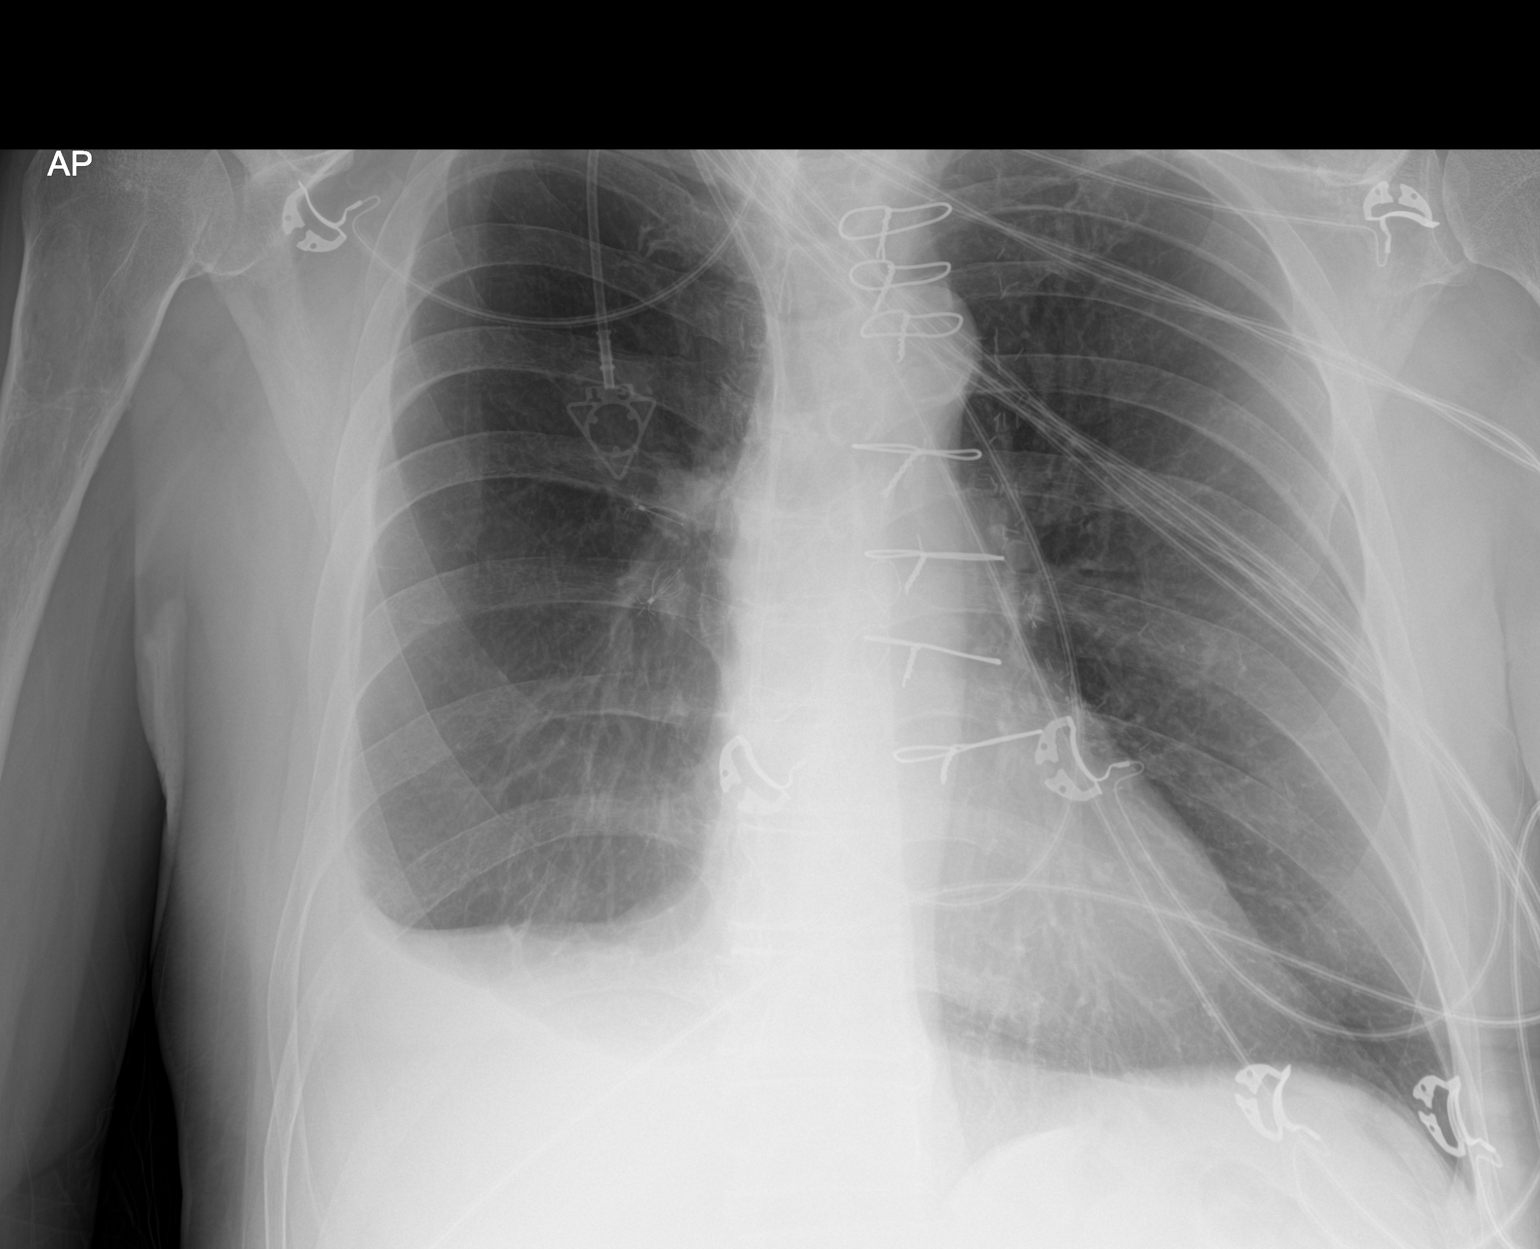

[1 of 1 positions shown; findings below may reference images not displayed]

FINDINGS: Portable AP upright view at 5293 hours. Chronic right pleural
effusion with pleural thickening as seen by CT last month is stable.
Stable right chest power port. Stable lung volumes and mediastinal
contours. No pneumothorax, pulmonary edema or new pulmonary opacity.
Prior CABG. Prior ACDF. Stable visualized osseous structures.
IMPRESSION: Stable chronic right pleural effusion. No new cardiopulmonary
abnormality.

## 2023-04-03 IMAGING — CT CT ANGIO CHEST
3 of 11 series · 17 of 36 positions shown · IV contrast (omnipaque)
Comparison: April 27, 2021.

CLINICAL DATA: Cough.

EXAM:
CT ANGIOGRAPHY CHEST WITH CONTRAST
TECHNIQUE: Multidetector CT imaging of the chest was performed using the
standard protocol during bolus administration of intravenous
contrast. Multiplanar CT image reconstructions and MIPs were
obtained to evaluate the vascular anatomy.
CONTRAST:  100mL OMNIPAQUE IOHEXOL 350 MG/ML SOLN

[Series 7: pe thins · axial · 0.69mm/px · z∈[-358,-71]mm · 14 of 333 slices shown]
[im 23/333  lung]
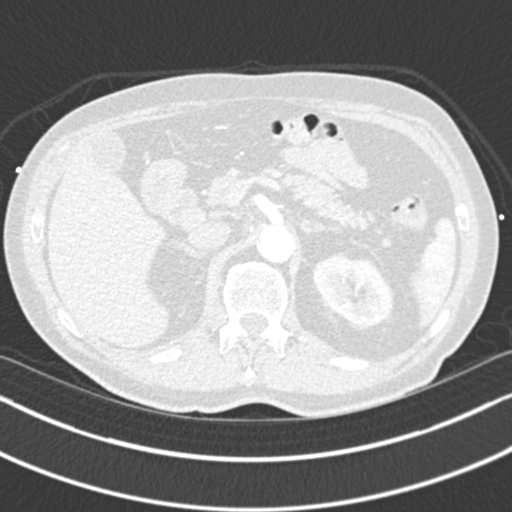
[im 45/333  mediastinal]
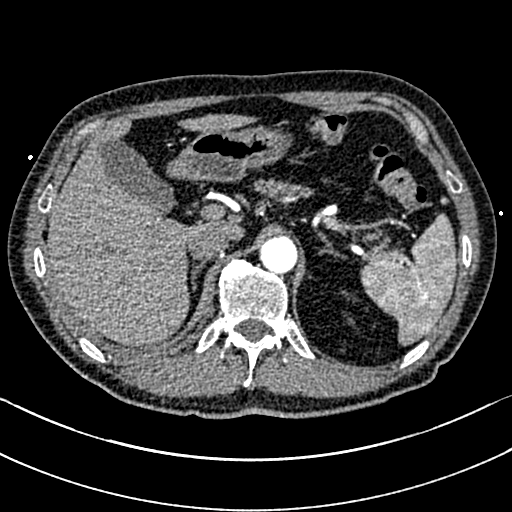
[im 67/333  lung]
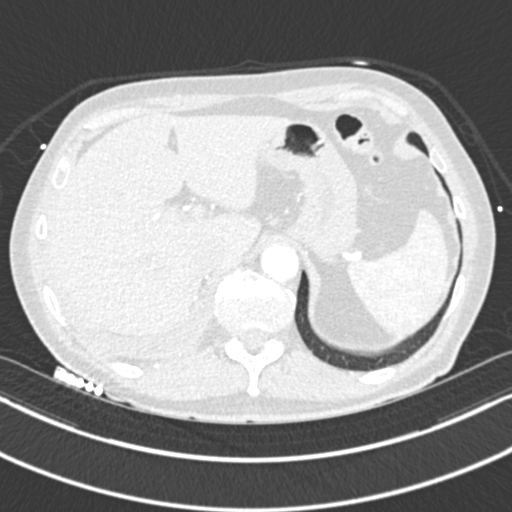
[im 89/333  mediastinal]
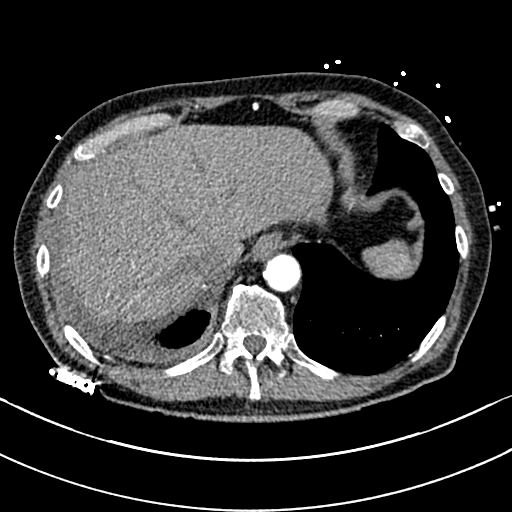
[im 111/333  lung]
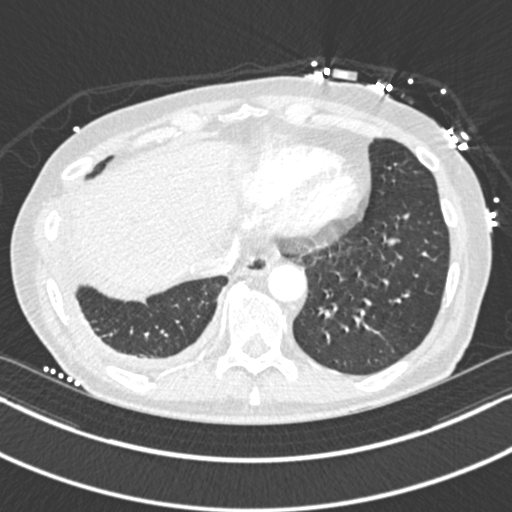
[im 133/333  mediastinal]
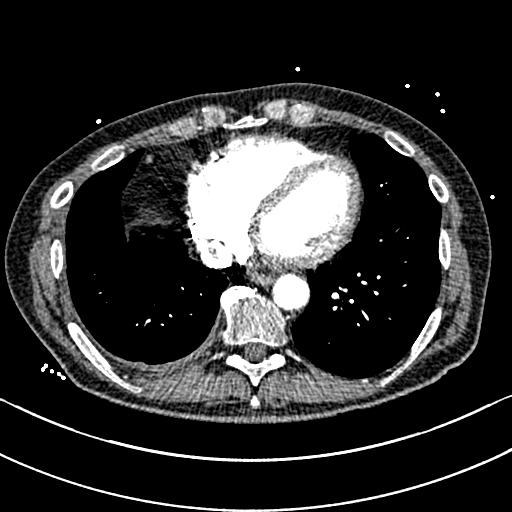
[im 155/333  lung]
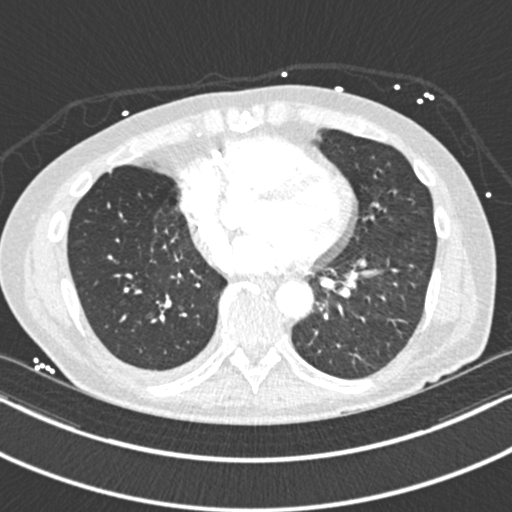
[im 178/333  mediastinal]
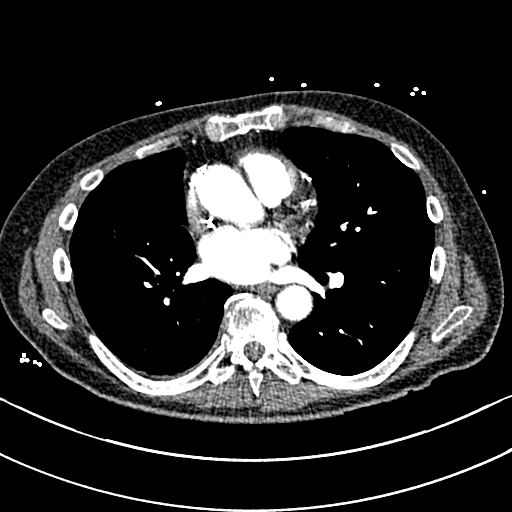
[im 200/333  lung]
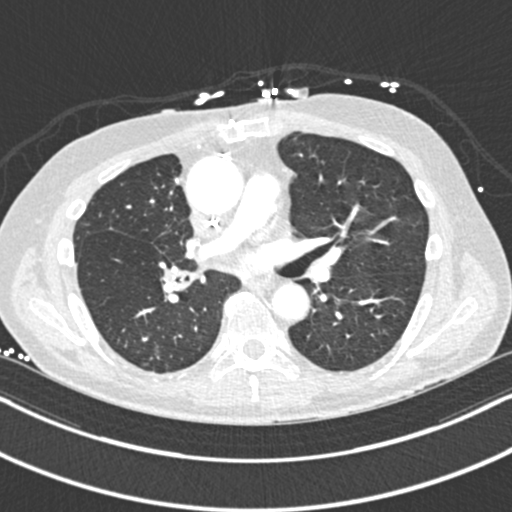
[im 222/333  mediastinal]
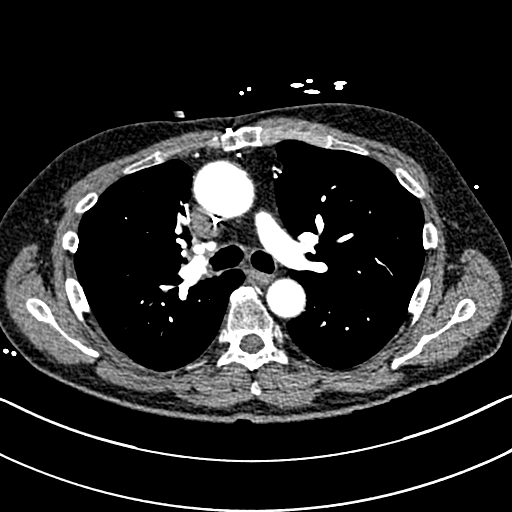
[im 244/333  lung]
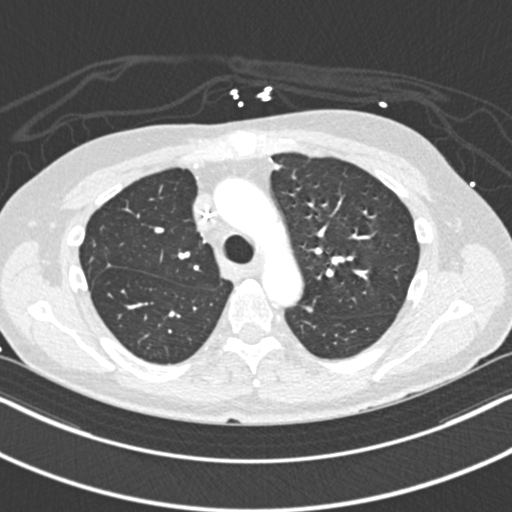
[im 266/333  mediastinal]
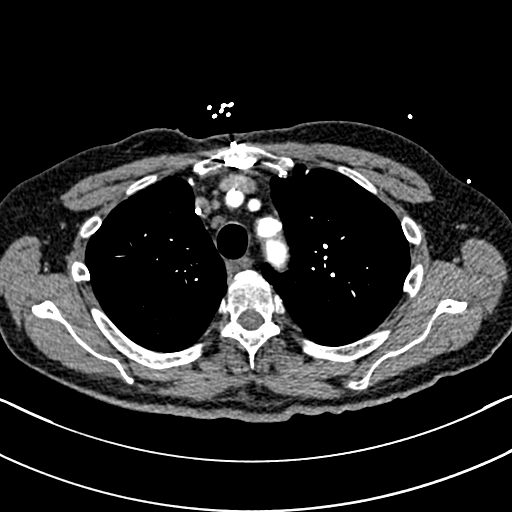
[im 288/333  lung]
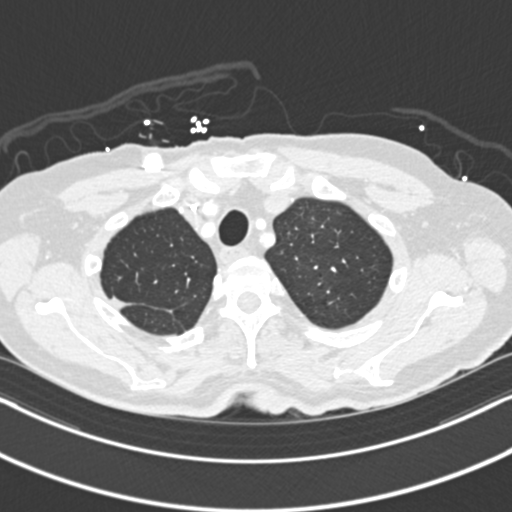
[im 310/333  mediastinal]
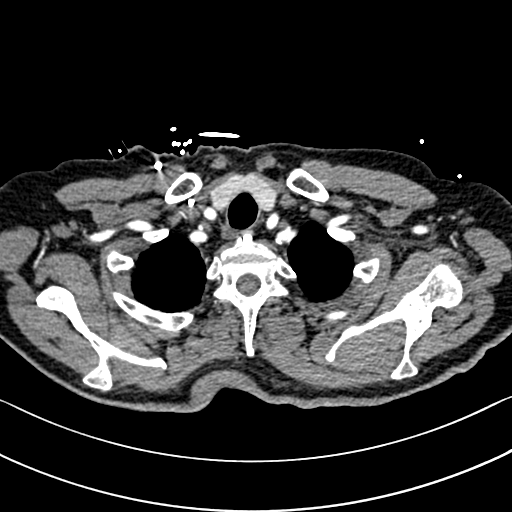

[Series 8: pe lung · axial · 0.69mm/px · z∈[-225,-138]mm · 2 of 89 slices shown]
[im 30/89  mediastinal]
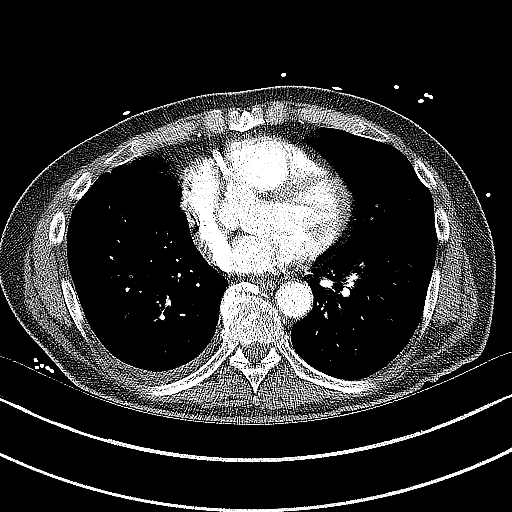
[im 59/89  mediastinal]
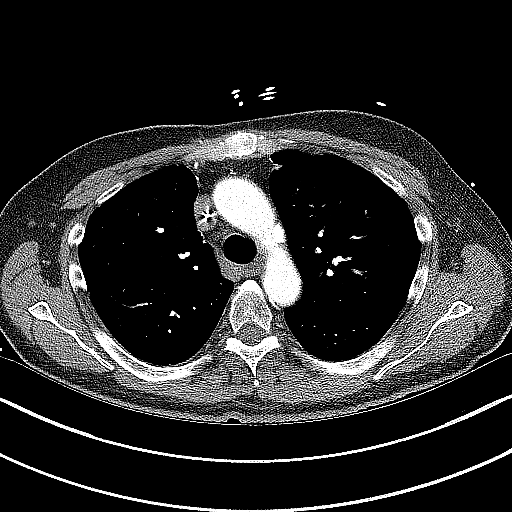

[Series 9: pe coronal mpr · coronal · 0.66mm/px · 1 of 128 slices shown]
[im 64/128  mediastinal]
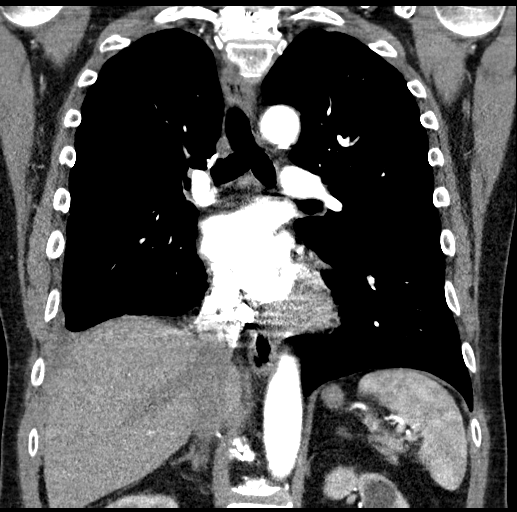

[17 of 36 positions shown; findings below may reference images not displayed]

FINDINGS: Cardiovascular: Satisfactory opacification of the pulmonary arteries
to the segmental level. No evidence of pulmonary embolism. Normal
heart size. No pericardial effusion. Atherosclerosis of thoracic
aorta is noted without aneurysm formation. Status post coronary
bypass graft.

Mediastinum/Nodes: No enlarged mediastinal, hilar, or axillary lymph
nodes. Thyroid gland, trachea, and esophagus demonstrate no
significant findings.

Lungs/Pleura: No pneumothorax is noted. Left lung is clear. Stable
right upper lobe scarring is noted. Minimal right pleural effusion
is noted with adjacent subsegmental atelectasis.

Upper Abdomen: No acute abnormality.

Musculoskeletal: No chest wall abnormality. No acute or significant
osseous findings.

Review of the MIP images confirms the above findings.
IMPRESSION: No definite evidence of pulmonary embolus.

Minimal right pleural effusion is noted with minimal adjacent
subsegmental atelectasis.

Aortic Atherosclerosis (FOXCH-M93.3).

## 2023-04-05 IMAGING — DX DG RIBS W/ CHEST 3+V*R*
5 series · 5 of 5 positions shown · non-contrast
Comparison: Chest x-ray 06/04/2021, CT chest 06/04/2021

CLINICAL DATA: right sided rib pain

EXAM:
RIGHT RIBS AND CHEST - 3+ VIEW

[chest pa]
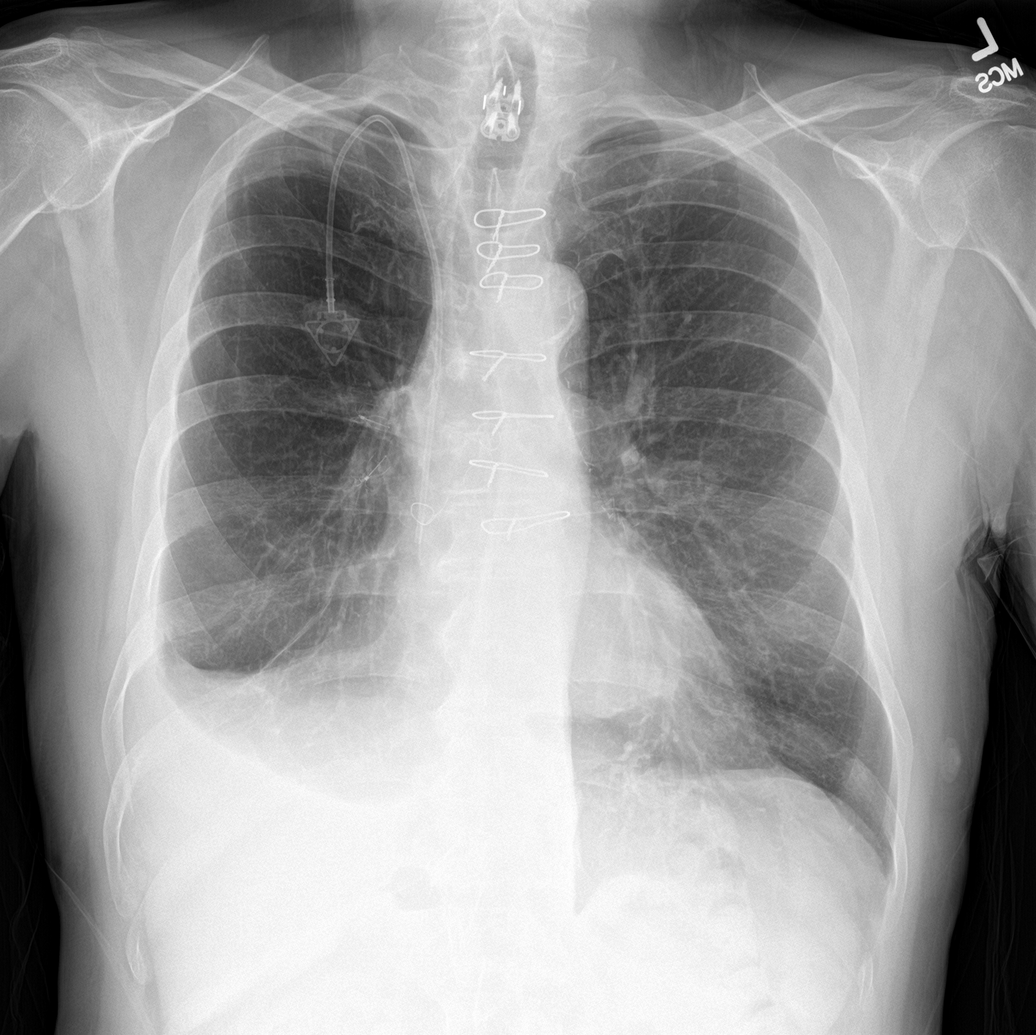

[rib pa (1 of 3)]
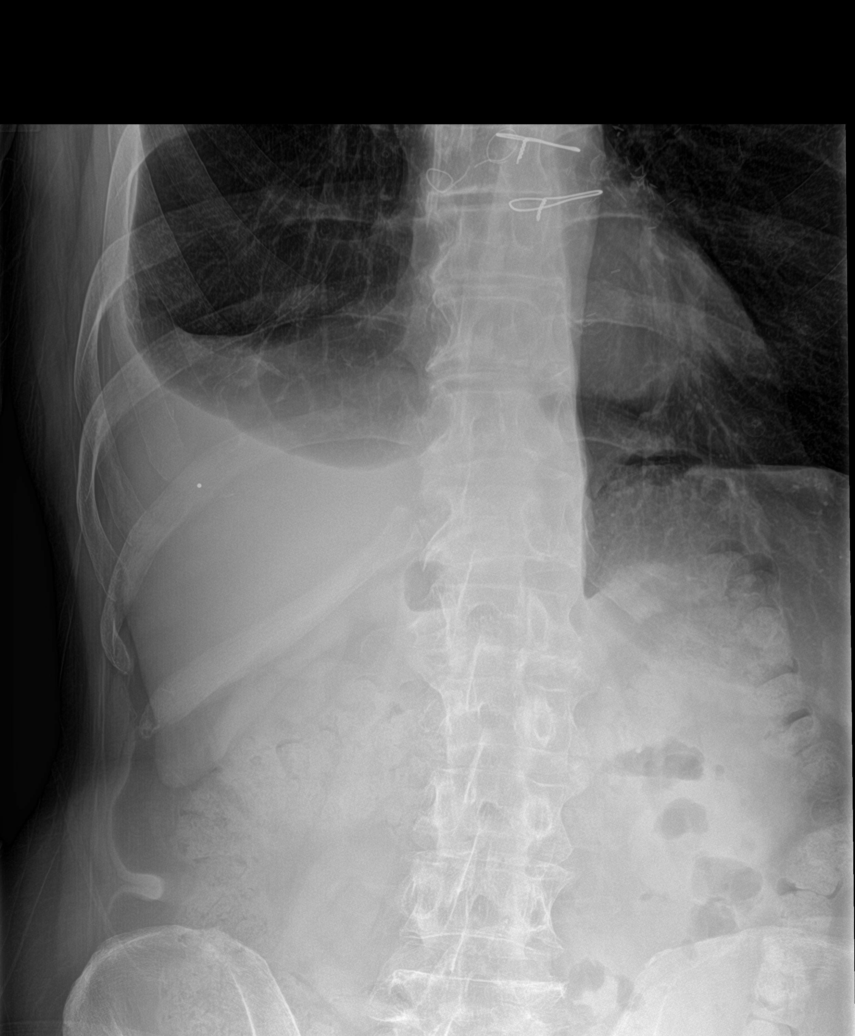

[rib pa obl]
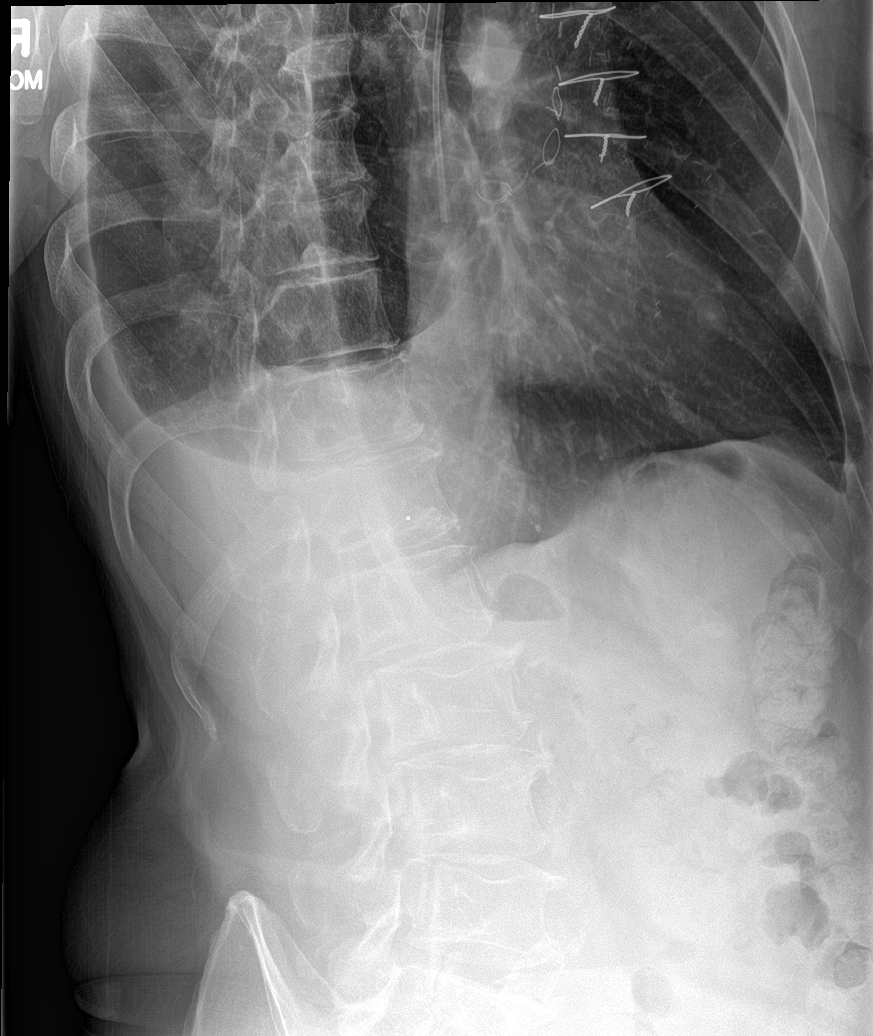

[rib pa (2 of 3)]
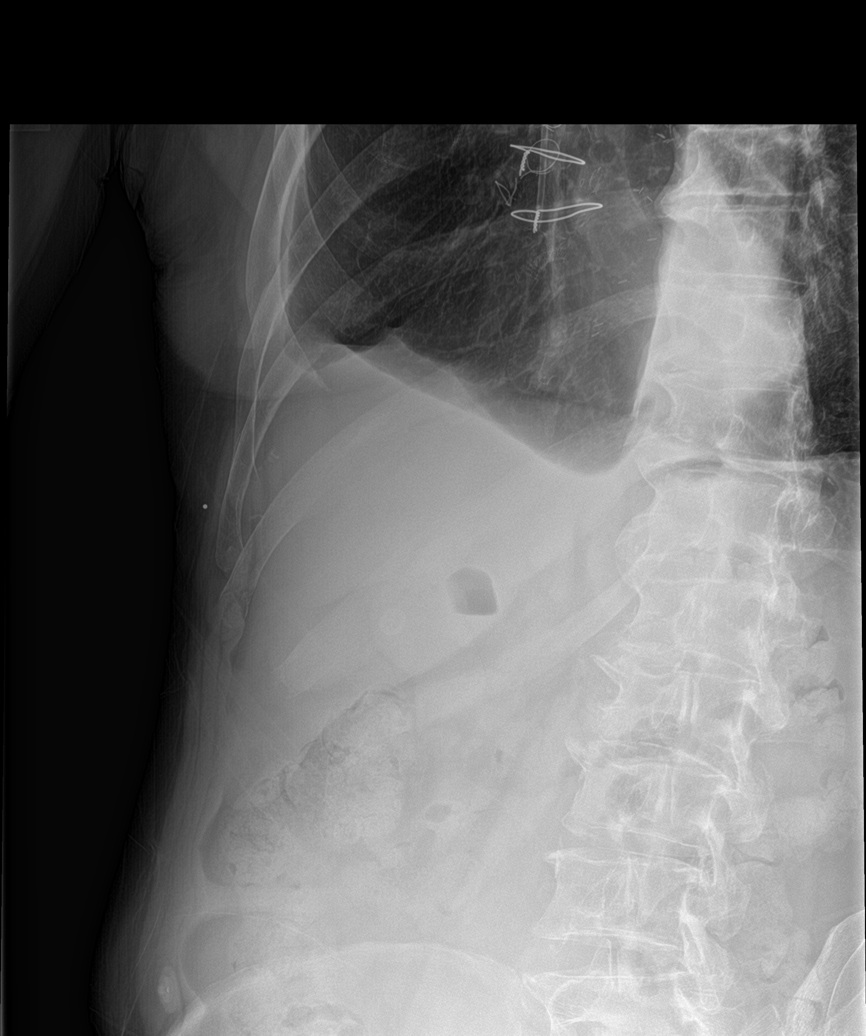

[rib pa (3 of 3)]
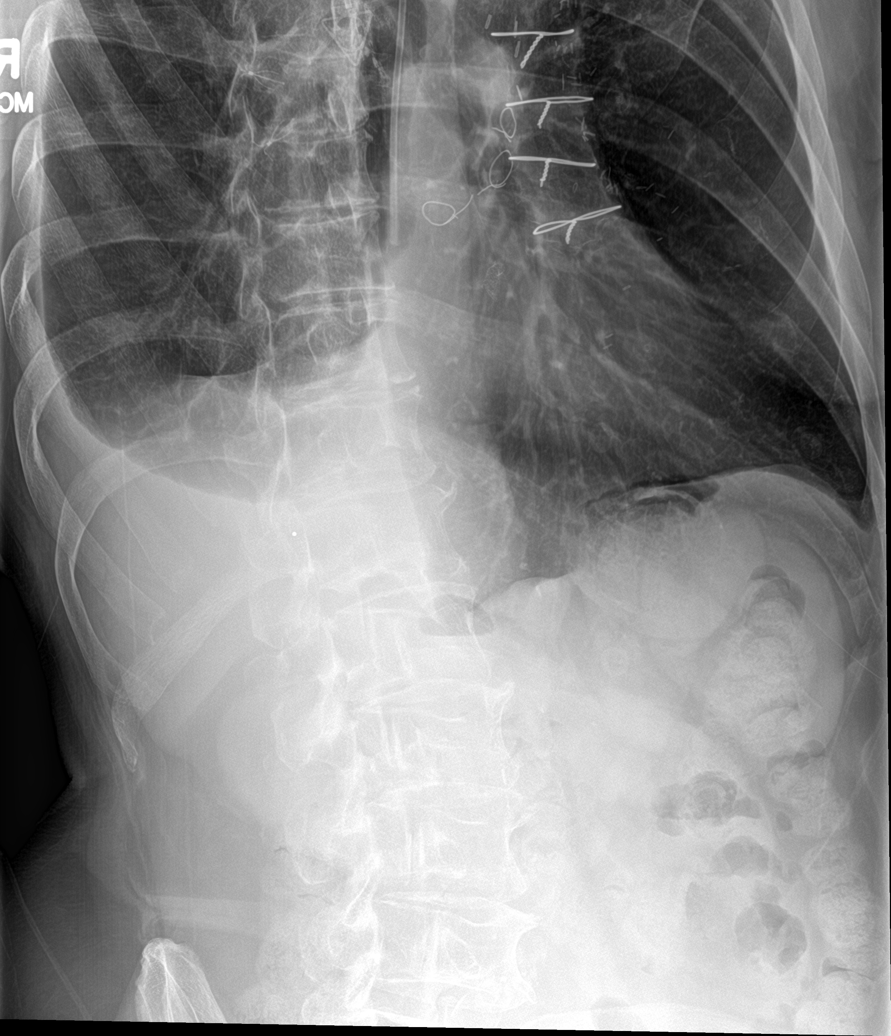

[5 of 5 positions shown; findings below may reference images not displayed]

FINDINGS: Right chest wall Port-A-Cath with tip overlying right atrium just
distal to the superior cavoatrial junction.

The heart and mediastinal contours are unchanged. Aortic
calcification. Surgical hardware overlies the mediastinum.

No focal consolidation. No pulmonary edema. Persistent, possibly
slightly increased in size, trace volume right pleural effusion. No
pneumothorax.

Punctate round metallic BB marker noted overlying the right eleventh
rib. No acute displaced right fracture or other bone lesions are
seen involving the ribs.

No acute osseous abnormality. Multilevel degenerative changes of the
spine.
IMPRESSION: 1. No acute displaced right rib fracture. Please note, nondisplaced
rib fractures may be occult on radiograph.
2. Persistent, possibly slightly increased in size, trace volume
right pleural effusion.

## 2023-04-24 IMAGING — DX DG RIBS W/ CHEST 3+V*R*
3 series · 3 of 3 positions shown · non-contrast
Comparison: 06/06/2021

CLINICAL DATA: Right side rib pain. History of lung cancer.
Shortness of breath

EXAM:
RIGHT RIBS AND CHEST - 3+ VIEW

[chest pa]
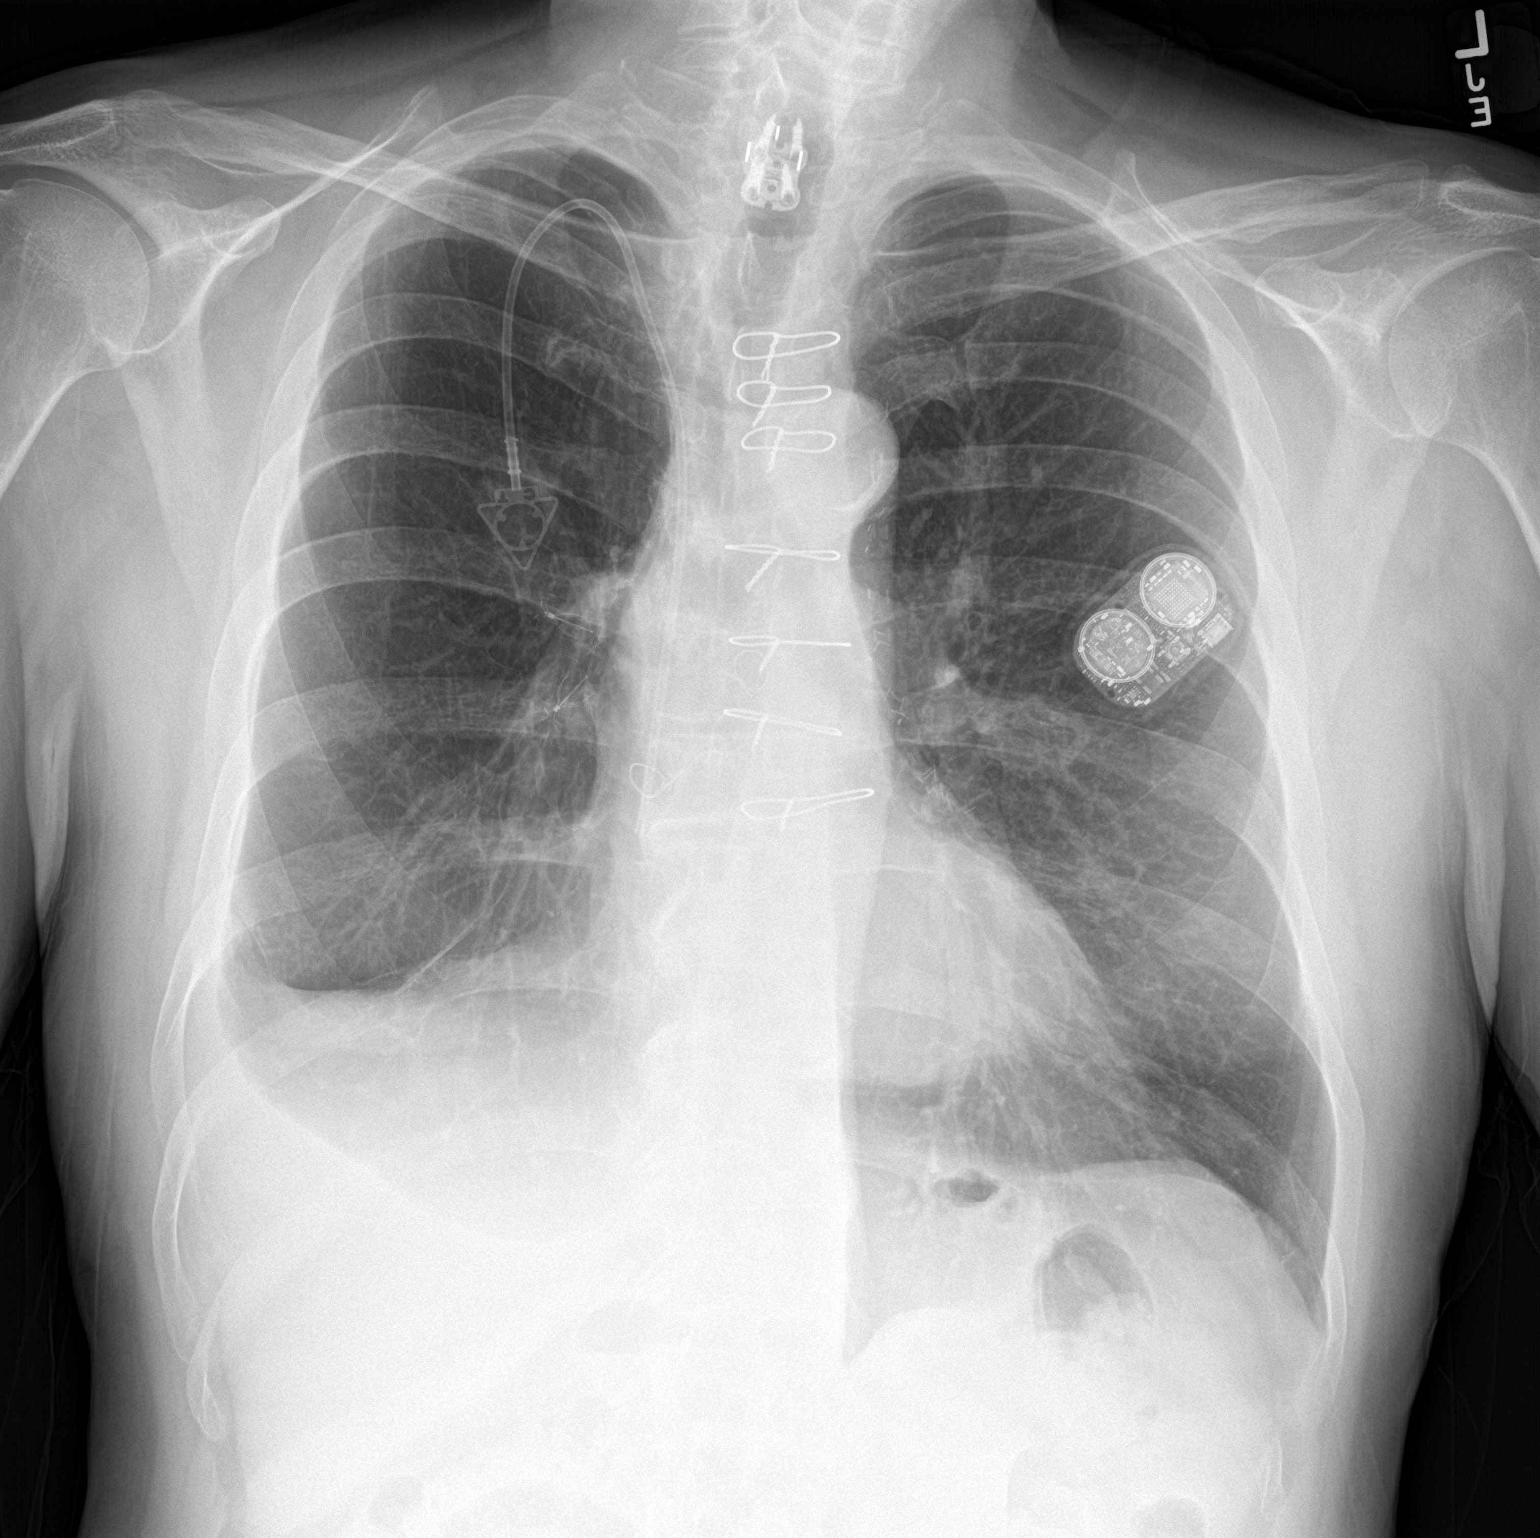

[rib ap]
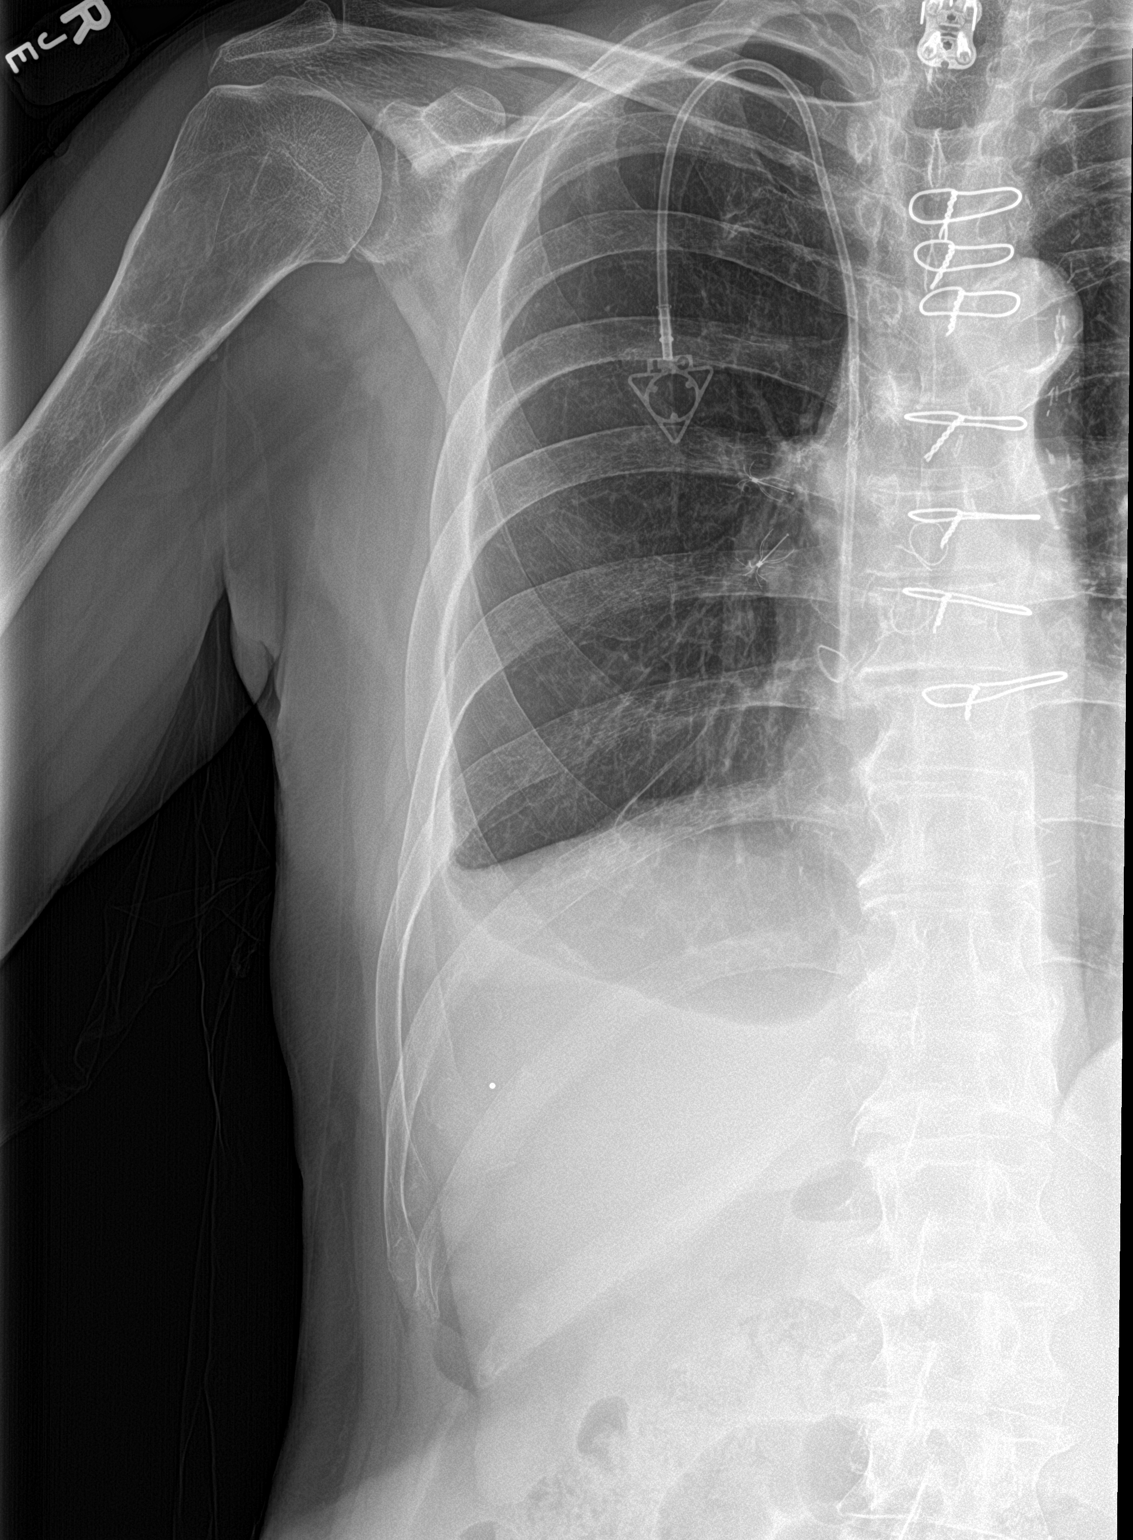

[rib ap obl]
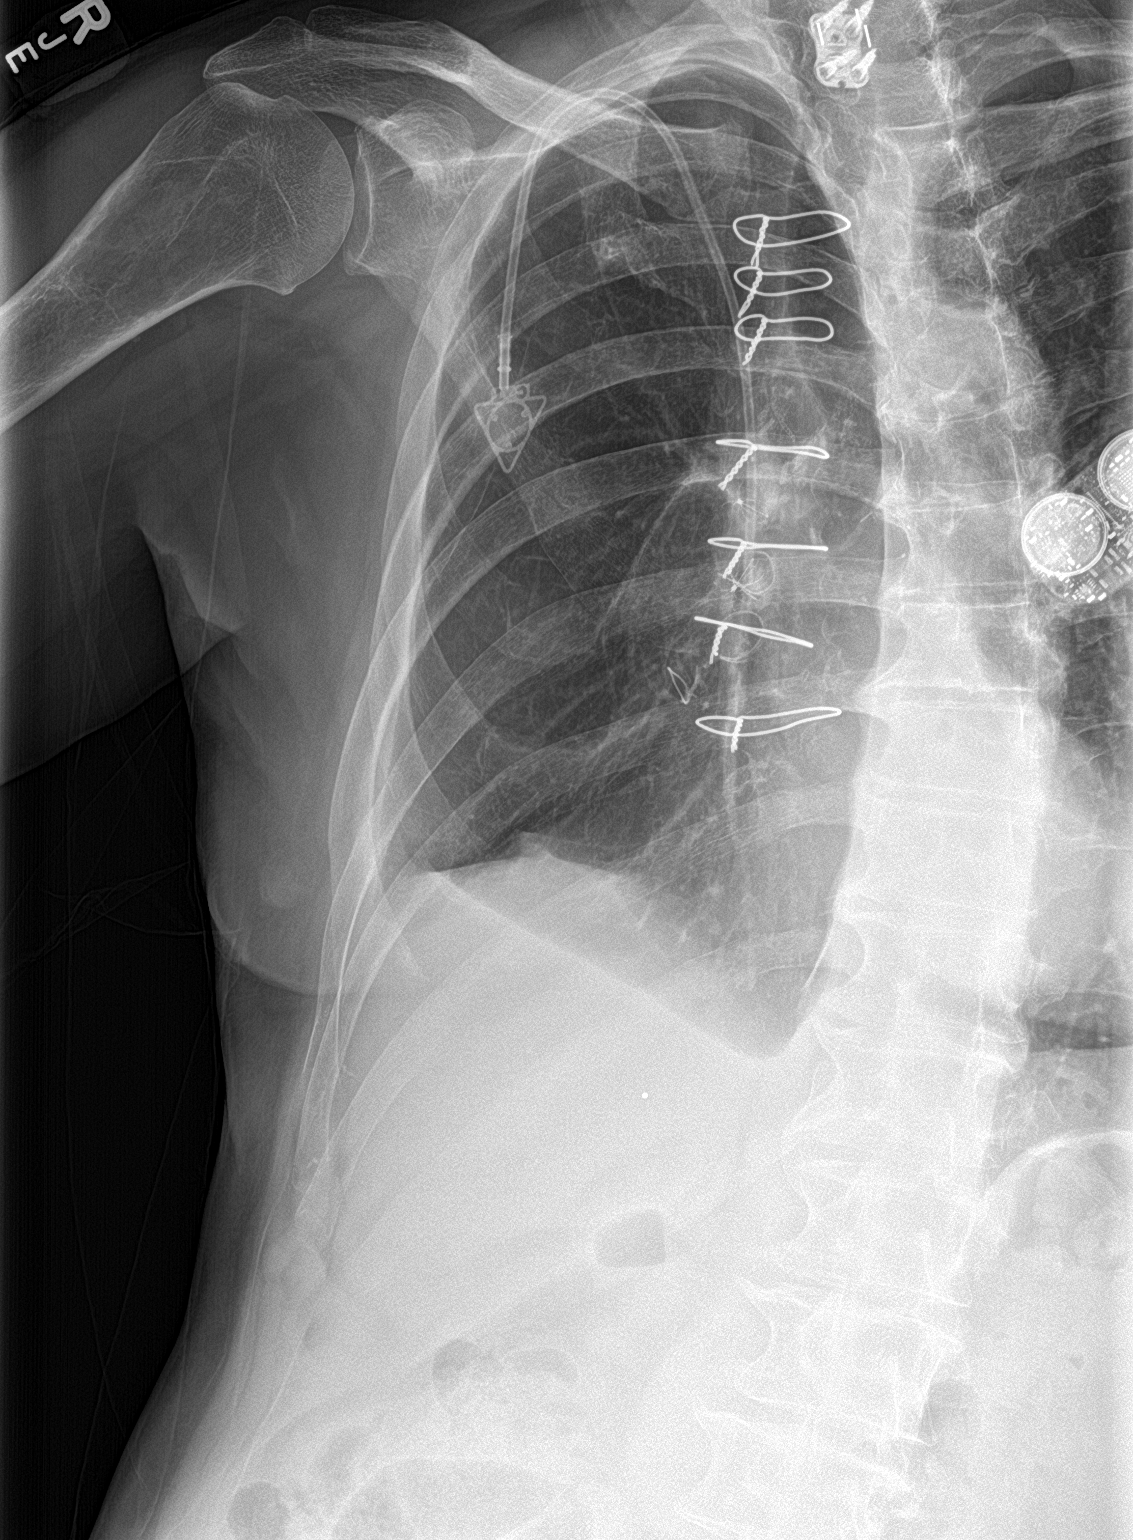

[3 of 3 positions shown; findings below may reference images not displayed]

FINDINGS: Right Port-A-Cath remains in place, unchanged. Prior CABG. Small
right pleural effusion again noted, unchanged. No confluent
opacities. Heart is normal size.
IMPRESSION: No visible displaced rib fracture.

Stable small right pleural effusion.

## 2023-04-25 IMAGING — CT CT ANGIO CHEST
2 of 7 series · 13 of 36 positions shown · IV contrast (omnipaque)
Comparison: Chest x-ray 06/26/2021, CT chest 06/04/2021,
12/12/2020, 04/27/2021, PET CT 11/27/2020

CLINICAL DATA: Shortness of breath tachycardia history of right
lung cancer

EXAM:
CT ANGIOGRAPHY CHEST WITH CONTRAST
TECHNIQUE: Multidetector CT imaging of the chest was performed using the
standard protocol during bolus administration of intravenous
contrast. Multiplanar CT image reconstructions and MIPs were
obtained to evaluate the vascular anatomy.
CONTRAST:  80mL OMNIPAQUE IOHEXOL 350 MG/ML SOLN

[Series 7: pe axial thins · axial · 0.71mm/px · z∈[-668,-401]mm · 12 of 317 slices shown]
[im 25/317  lung]
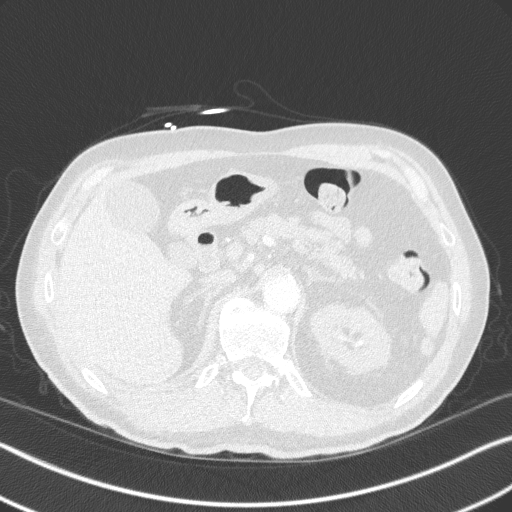
[im 49/317  mediastinal]
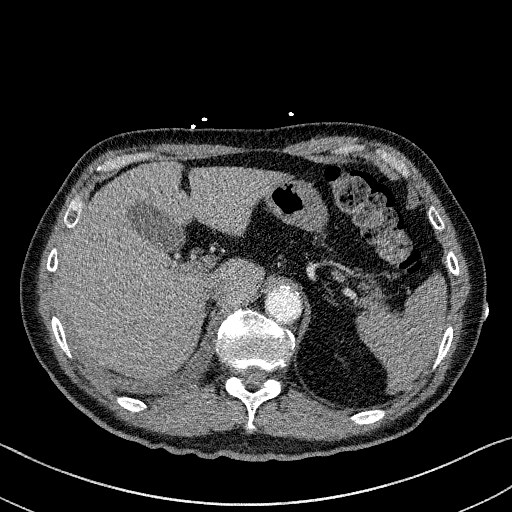
[im 73/317  lung]
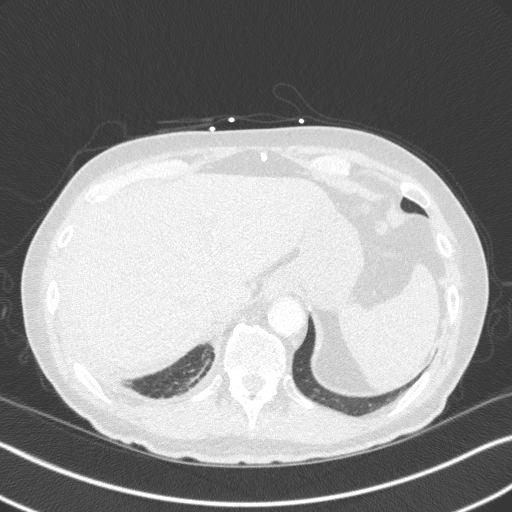
[im 98/317  mediastinal]
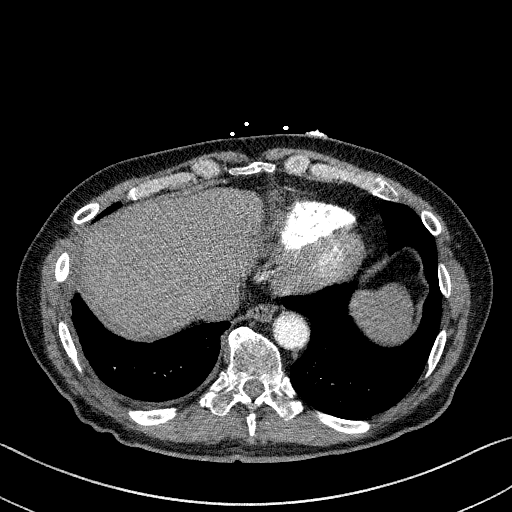
[im 122/317  lung]
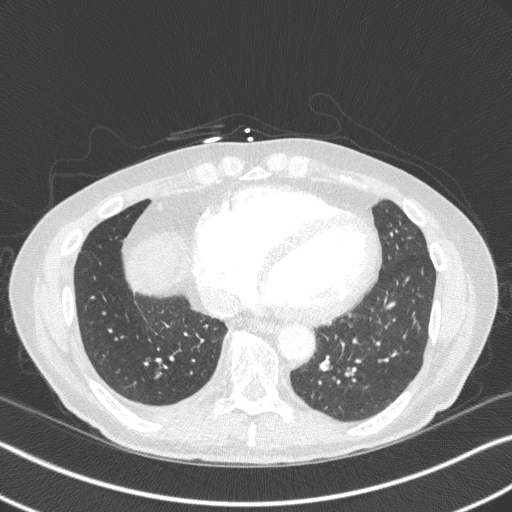
[im 146/317  mediastinal]
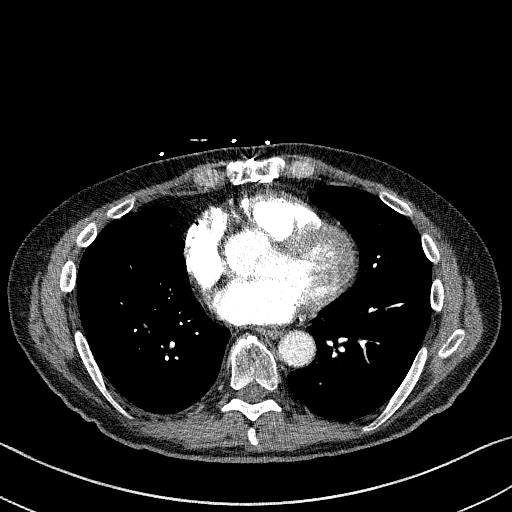
[im 171/317  lung]
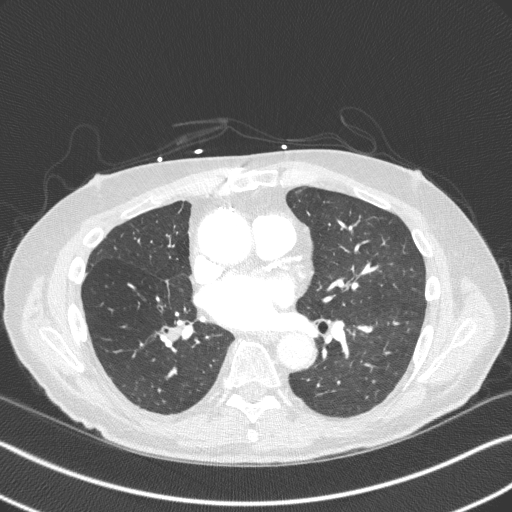
[im 195/317  mediastinal]
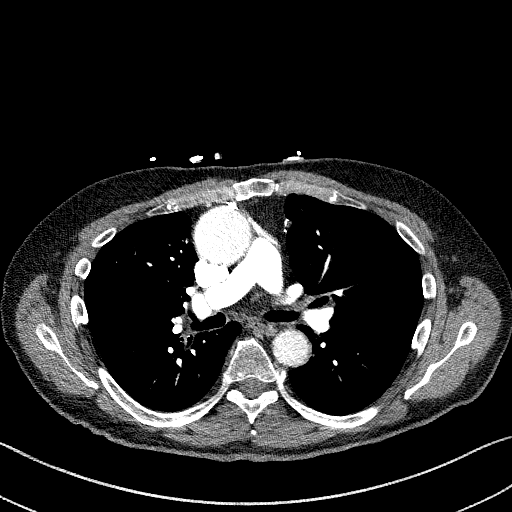
[im 219/317  lung]
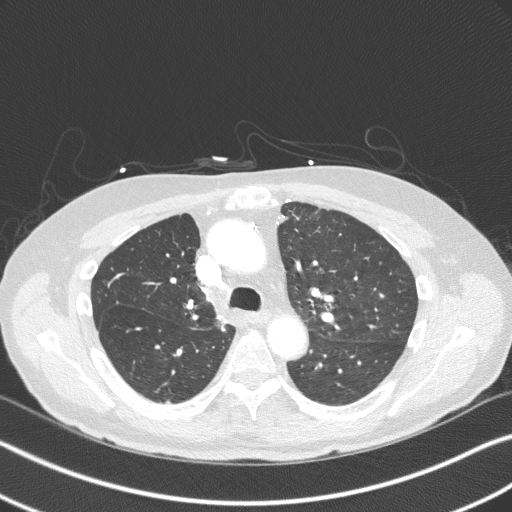
[im 244/317  mediastinal]
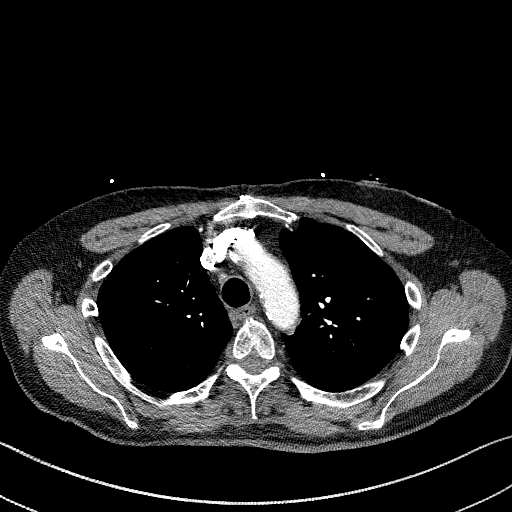
[im 268/317  lung]
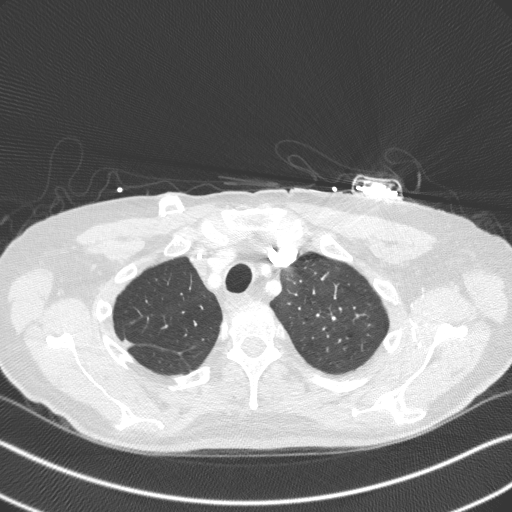
[im 292/317  mediastinal]
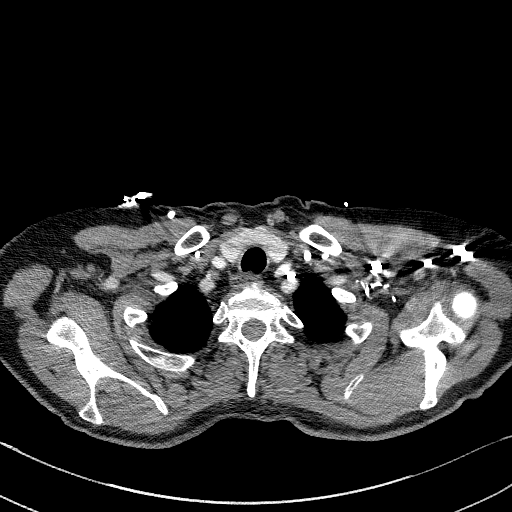

[Series 9: cor soft · coronal · 0.64mm/px · 1 of 121 slices shown]
[im 61/121  mediastinal]
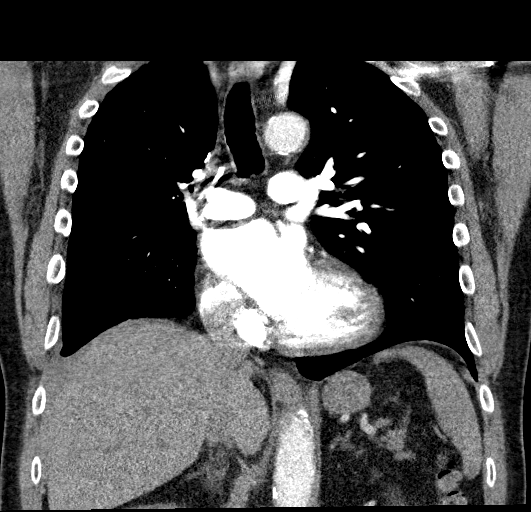

[13 of 36 positions shown; findings below may reference images not displayed]

FINDINGS: Cardiovascular: Satisfactory opacification of the pulmonary arteries
to the segmental level. No evidence of pulmonary embolism. Status
post CABG. Ectatic ascending aorta up to 3.9 cm. Moderate aortic
atherosclerosis. Coronary vascular calcification. No pericardial
effusion.

Mediastinum/Nodes: Midline trachea. No thyroid mass. Endobronchial
valves on the right. Esophagus within normal limits.

Lungs/Pleura: Right upper lobectomy changes. Small right-sided
pleural effusion. Enlarging 6 mm left upper lobe pulmonary nodule
abutting the fissure, series 8, image 48.

Upper Abdomen: No acute abnormality.

Musculoskeletal: Post sternotomy changes. No acute osseous
abnormality.

Review of the MIP images confirms the above findings.
IMPRESSION: 1. Negative for acute pulmonary embolus.
2. Postsurgical changes on the right with small right-sided pleural
effusion.
3. 6 mm left upper lobe enlarging pulmonary nodule, raising concern
for metastatic nodule or potential small carcinoma. Pulmonary
follow-up recommended.

Aortic Atherosclerosis ([id number]-ROT.T).

## 2023-04-25 IMAGING — DX DG CHEST 2V
2 series · 2 of 2 positions shown · non-contrast
Comparison: Chest x-ray 06/25/2021

CLINICAL DATA: Tachycardia and shortness of breath.

EXAM:
CHEST - 2 VIEW

[chest pa]
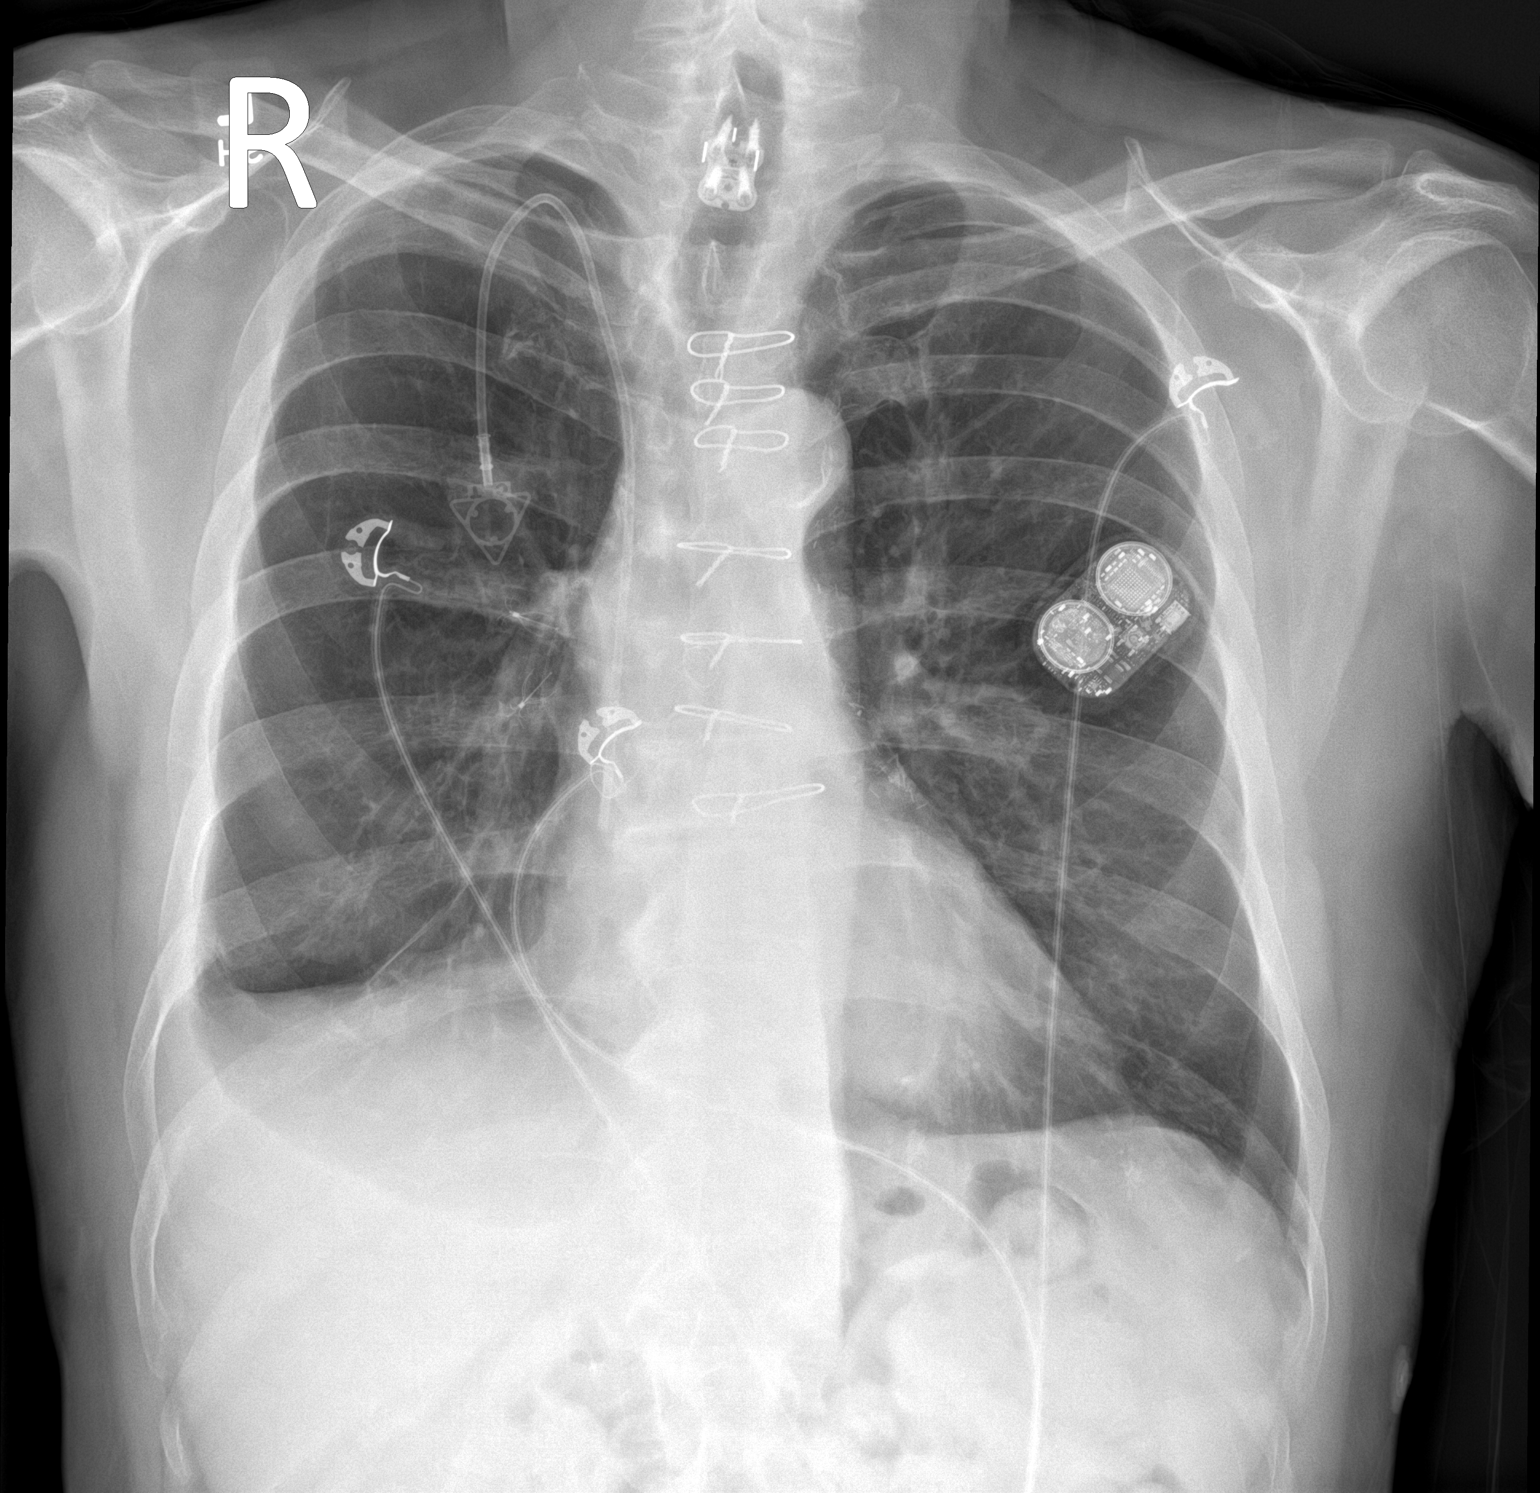

[chest lat]
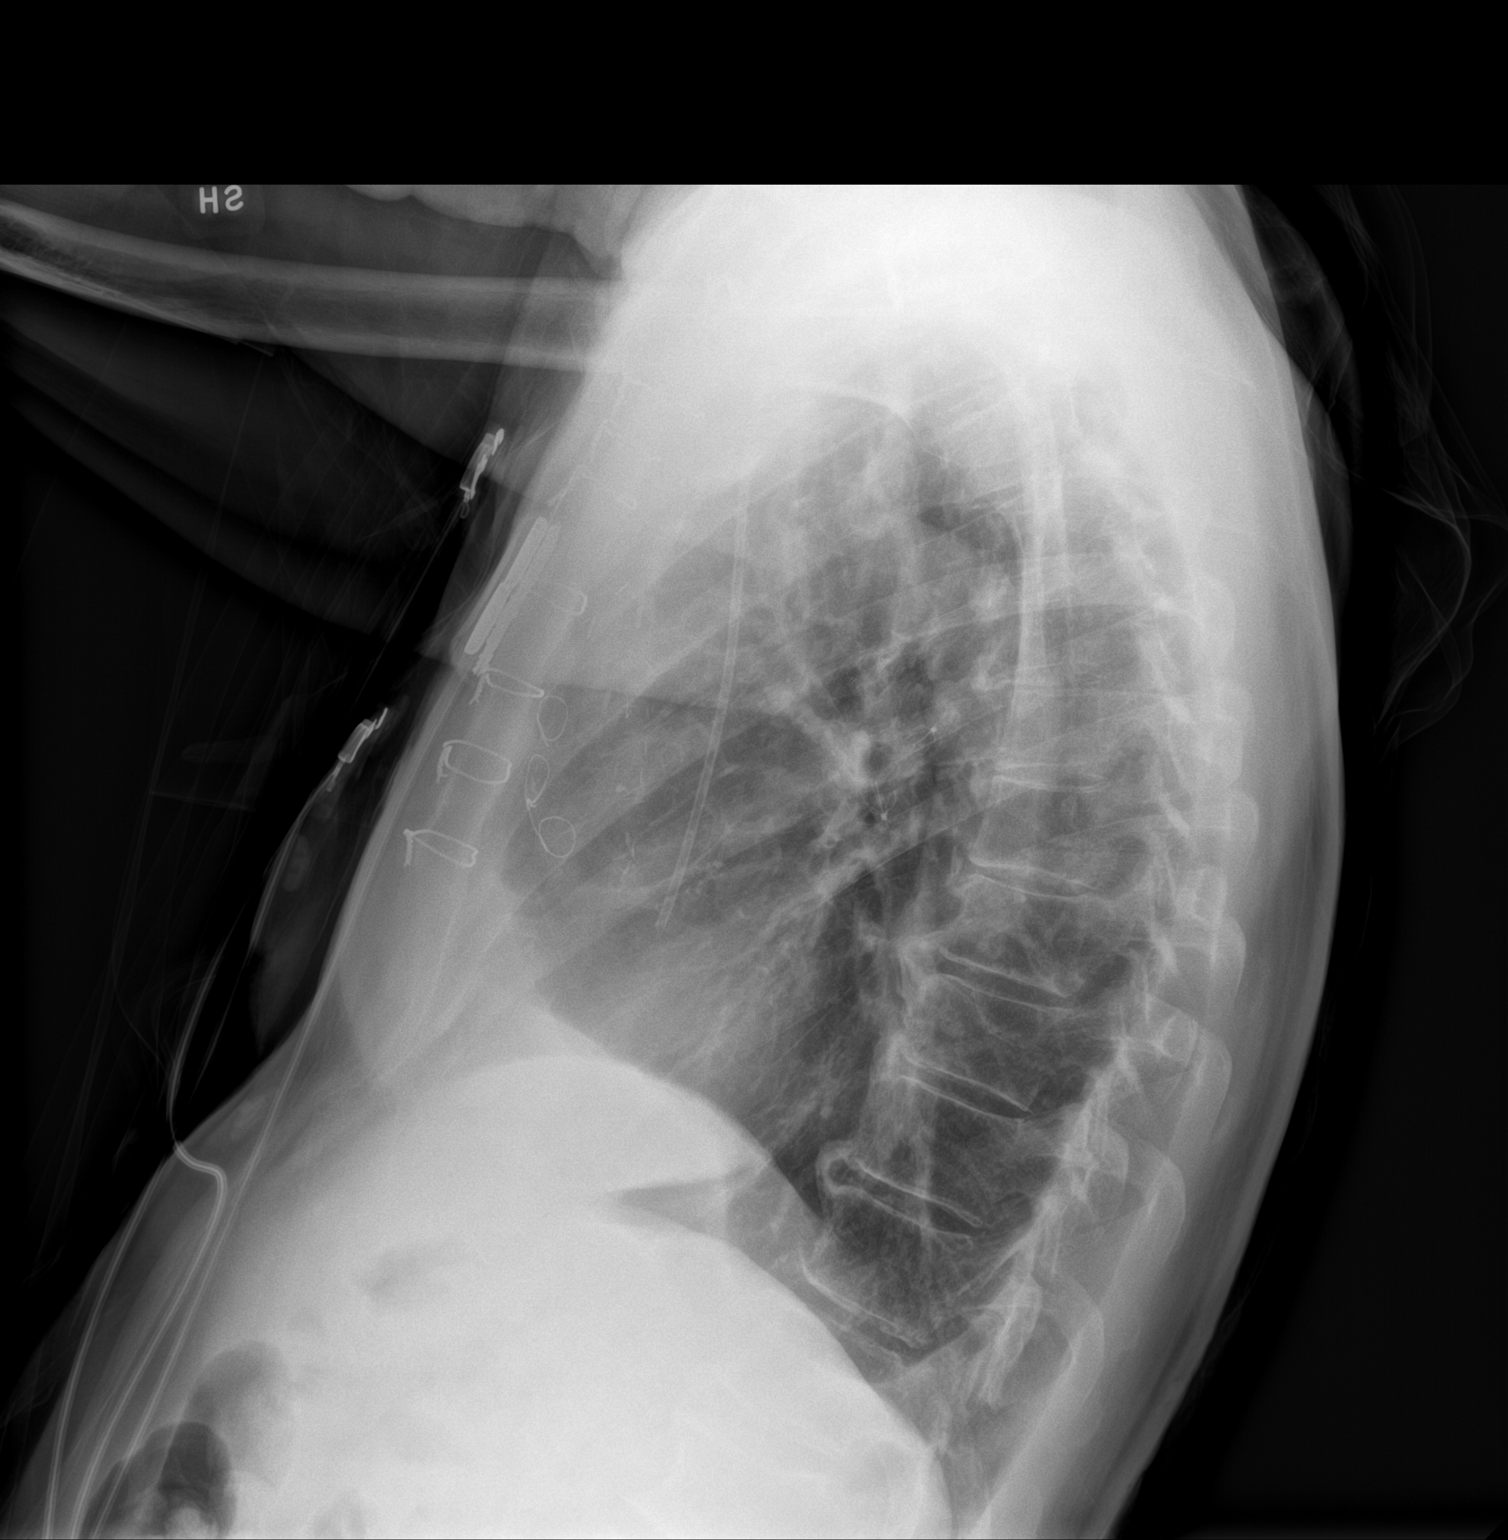

[2 of 2 positions shown; findings below may reference images not displayed]

FINDINGS: The right-sided power port is stable.

The cardiac silhouette, mediastinal and hilar contours are within
normal limits. Stable surgical changes coronary artery bypass
surgery.

Two small right-sided endobronchial valves are again noted.

Small residual pleural fluid or pleural thickening on the right
side. No acute pulmonary process. No pleural effusion or
pneumothorax.
IMPRESSION: No acute cardiopulmonary findings.

## 2023-05-02 IMAGING — PT NM PET TUM IMG RESTAG (PS) SKULL BASE T - THIGH
7 series · 25 of 25 positions shown · non-contrast
Comparison: CT chest 06/26/2021 and 06/04/2021, abdominopelvic CT
02/14/2021 and PET-CT 11/27/2020

CLINICAL DATA: Subsequent treatment strategy for non-small cell
lung cancer (large neuroendocrine cancer). Previous right upper
lobectomy and chemotherapy. Increasing chest wall pain.

EXAM:
NUCLEAR MEDICINE PET SKULL BASE TO THIGH
TECHNIQUE: 8.2 mCi F-18 FDG was injected intravenously. Full-ring PET imaging
was performed from the skull base to thigh after the radiotracer. CT
data was obtained and used for attenuation correction and anatomic
localization.
Fasting blood glucose: 125 mg/dl

[Series 3: pet sk_thigh ac · axial · 5.0mm · 4.07mm/px · z∈[-1395,-475]mm · 6 of 231 slices shown]
[im 1/231]
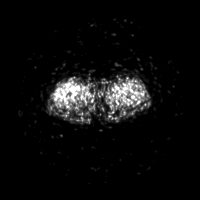
[im 47/231]
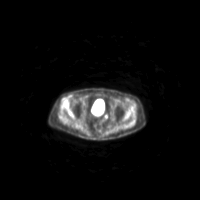
[im 93/231]
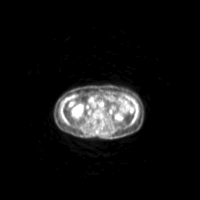
[im 139/231]
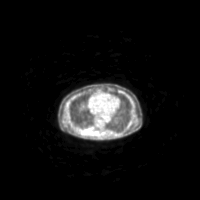
[im 185/231]
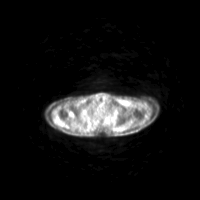
[im 231/231]
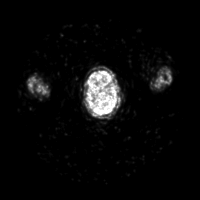

[Series 4: ct sk_thigh 5.0 bf37 · axial · 5.0mm · 0.98mm/px · z∈[-1395,-475]mm · 5 of 231 slices shown]
[im 1/231]
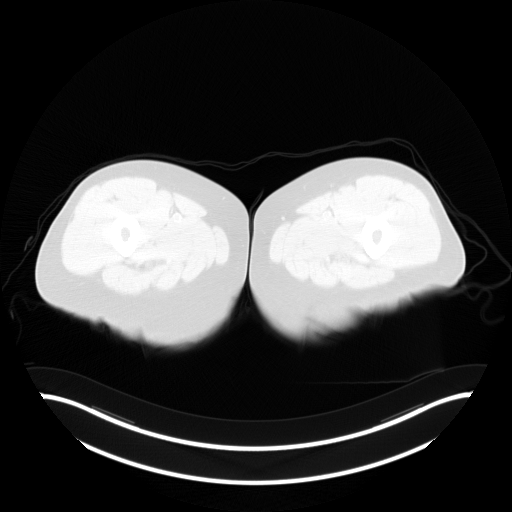
[im 58/231]
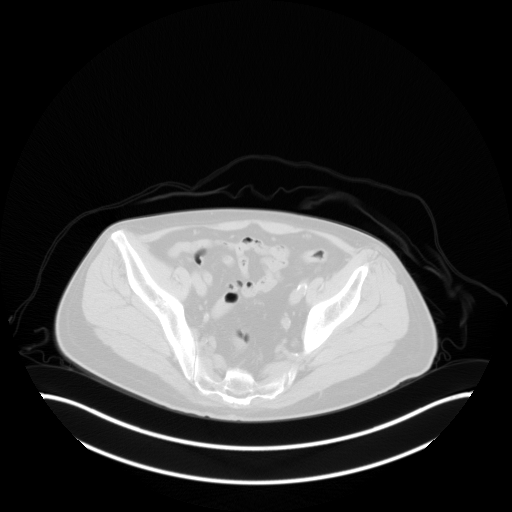
[im 116/231]
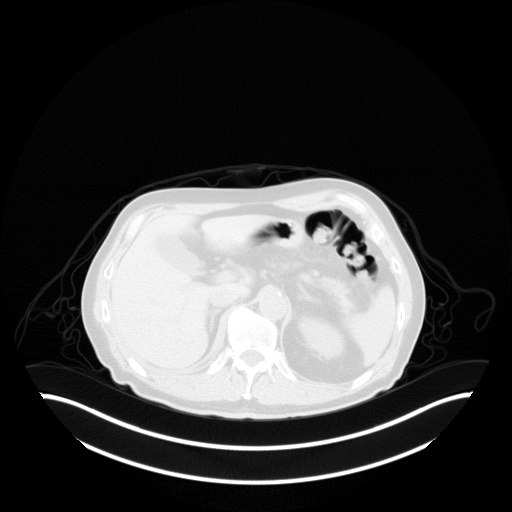
[im 173/231]
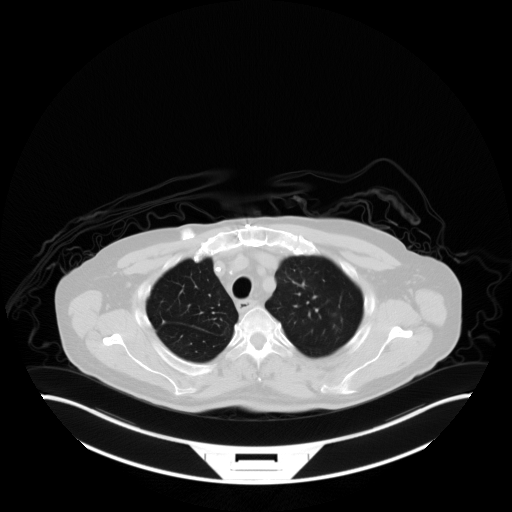
[im 231/231  brain]
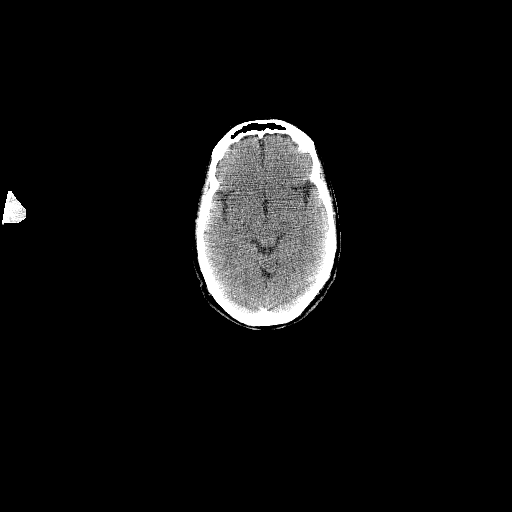

[Series 5: pet sk_thigh nac · axial · 5.0mm · 4.07mm/px · z∈[-1395,-475]mm · 5 of 231 slices shown]
[im 1/231]
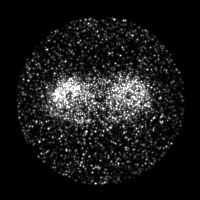
[im 58/231]
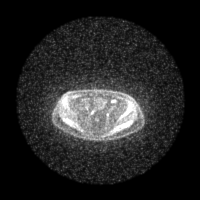
[im 116/231]
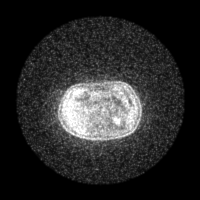
[im 173/231]
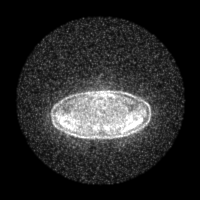
[im 231/231]
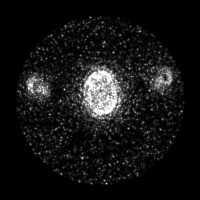

[Series 8: ct sk_thigh 5.0 br59 lung_bone · axial · 5.0mm · 0.67mm/px · z∈[-925,-649]mm · 2 of 70 slices shown]
[im 1/70]
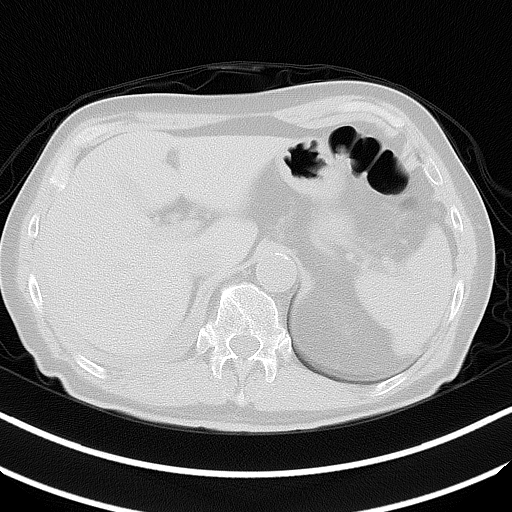
[im 70/70]
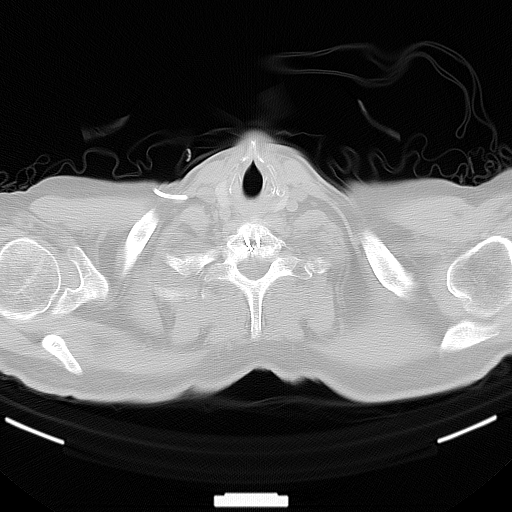

[Series 603: fused cor · 1 of 32 slices shown]
[im 1/32]
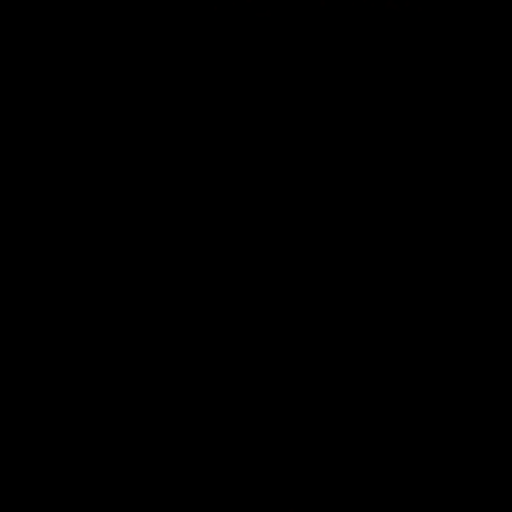

[Series 604: <mip collection> · coronal · 1.91mm/px · 1 of 32 slices shown]
[im 1/32]
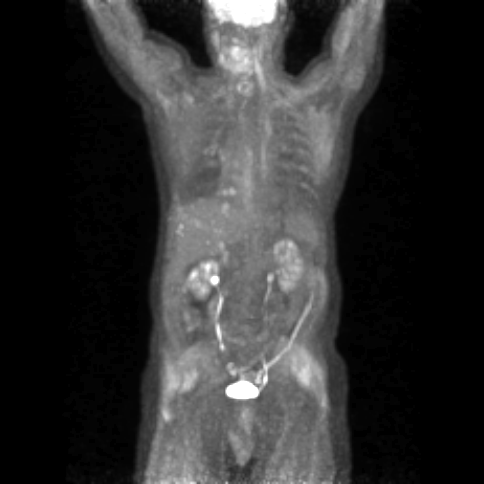

[Series 605: range-ct sk_thigh 5.0 bf37-tra-<alpha range> · 5 of 225 slices shown]
[im 1/225]
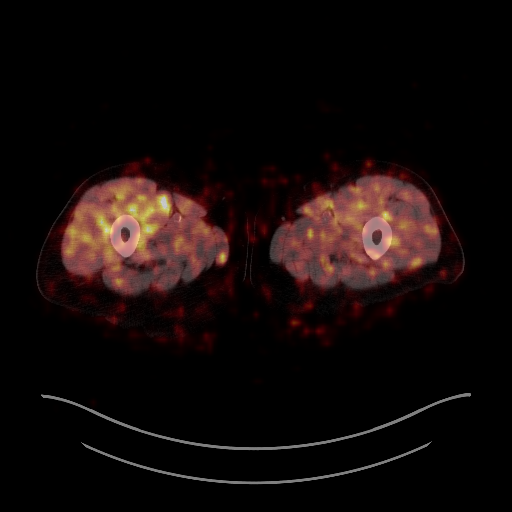
[im 57/225]
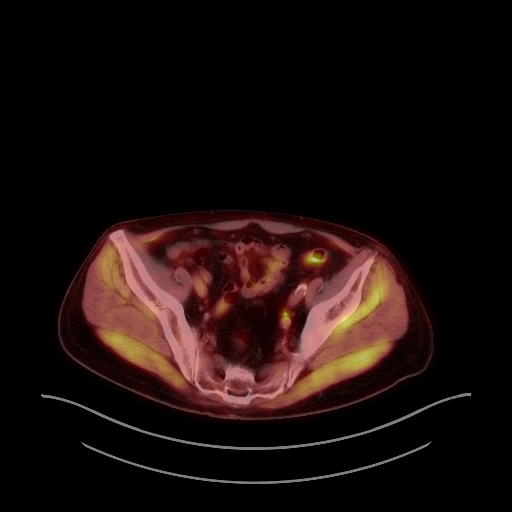
[im 113/225]
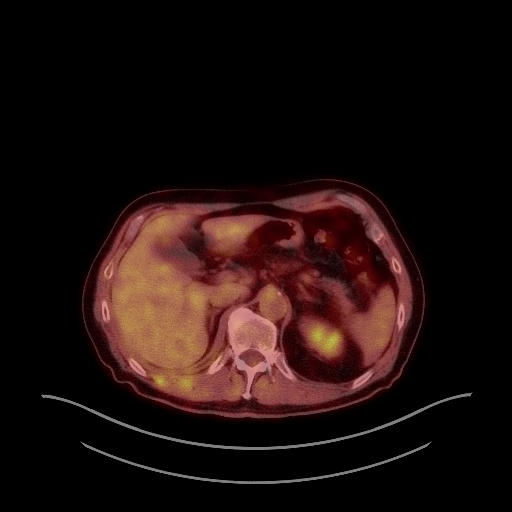
[im 169/225]
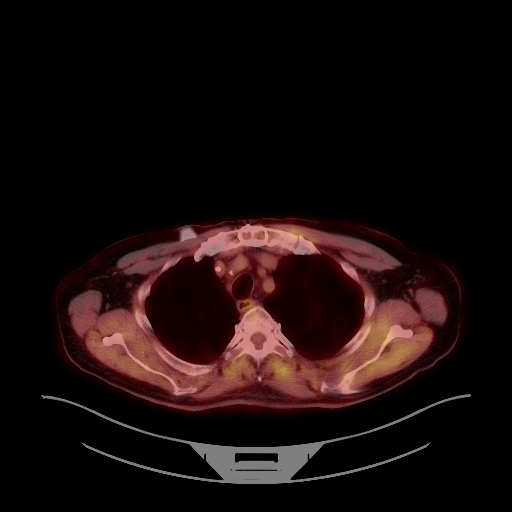
[im 225/225]
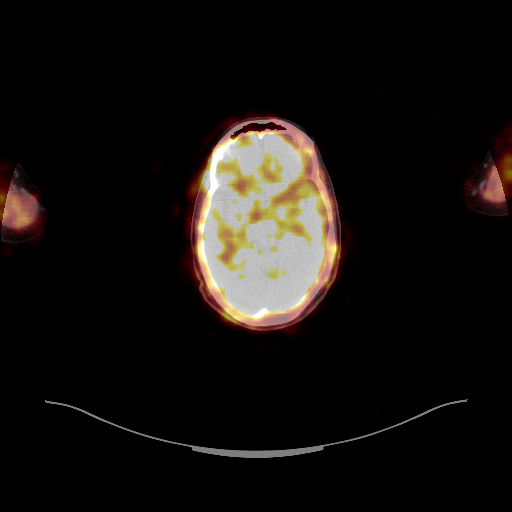

[25 of 25 positions shown; findings below may reference images not displayed]

FINDINGS: Mediastinal blood pool activity: SUV max

NECK:

No hypermetabolic cervical lymph nodes are identified.There are no
lesions of the pharyngeal mucosal space.

Incidental CT findings: Bilateral carotid atherosclerosis.

CHEST:

There are no hypermetabolic mediastinal, hilar or axillary lymph
nodes. There is mildly prominent activity in the right hilum (SUV
max 3.3), without corresponding enlarged lymph node. There are new
patchy tree-in-bud airspace opacities within the superior segment of
the right lower lobe with associated low level hypermetabolic
activity (SUV max 2.5), most consistent with bronchopneumonia.
Additional components are present more inferiorly in the right lower
lobe (image 57/8), not present on chest CT from last week, and also
likely inflammatory. 6 mm left upper lobe nodule on image [DATE] does
not demonstrate significant hypermetabolic activity (SUV max 1.0),
although this nodule has slowly enlarged from CTs dating back to
02/14/2021. There is patchy hypermetabolic activity within the soft
tissues of the right chest wall posteriorly, most notably within the
9th intercostal space where there is some extrapleural nodularity
with an SUV max of 4.6. Stable small right pleural effusion without
significant hypermetabolic activity.

Incidental CT findings: Right IJ Port-A-Cath extends to the superior
cavoatrial junction. There is atherosclerosis of the aorta, great
vessels and coronary arteries status post median sternotomy and
CABG.

ABDOMEN/PELVIS:

There is no hypermetabolic activity within the liver, adrenal
glands, spleen or pancreas. There is no hypermetabolic nodal
activity.

Incidental CT findings: Left renal cyst and aortic and branch vessel
atherosclerosis are noted.

SKELETON:

There is no hypermetabolic activity to suggest osseous metastatic
disease. There is patchy muscular activity which is within
physiologic limits. As above, there are asymmetric components within
the soft tissues of the right posterior chest wall at the site of
the thoracotomy. This includes extrapleural nodular components in
the posterior right intercostal space which are mildly
hypermetabolic (SUV max 4.6).

Incidental CT findings: none
IMPRESSION: 1. Asymmetric nodular hypermetabolic activity within the posterior
right chest wall near the thoracotomy site. This finding is
nonspecific, and grossly stable from recent postoperative studies.
Early chest wall recurrence cannot be excluded given the patient's
pain. Attention on CT follow-up recommended. No bone destruction
identified.
2. The enlarging 6 mm nodule in the left upper lobe does not
demonstrate hypermetabolic activity, although is too small to
optimally evaluate by PET-CT. Recommend chest CT follow-in 3-6
months.
3. New patchy tree in bud airspace opacities with right lower lobe
with associated hypermetabolic activity (new from chest CT of
06/26/2021), consistent with bronchopneumonia.
4. No other evidence of metastatic disease.
5. These results will be called to the ordering clinician or
representative by the Radiologist Assistant, and communication
documented in the PACS or [REDACTED].

## 2023-06-17 IMAGING — DX DG CHEST 1V PORT
2 series · 2 of 2 positions shown · non-contrast
Comparison: Chest x-ray 07/29/2021

CLINICAL DATA: Sepsis

EXAM:
PORTABLE CHEST 1 VIEW

[chest ap (1 of 2)]
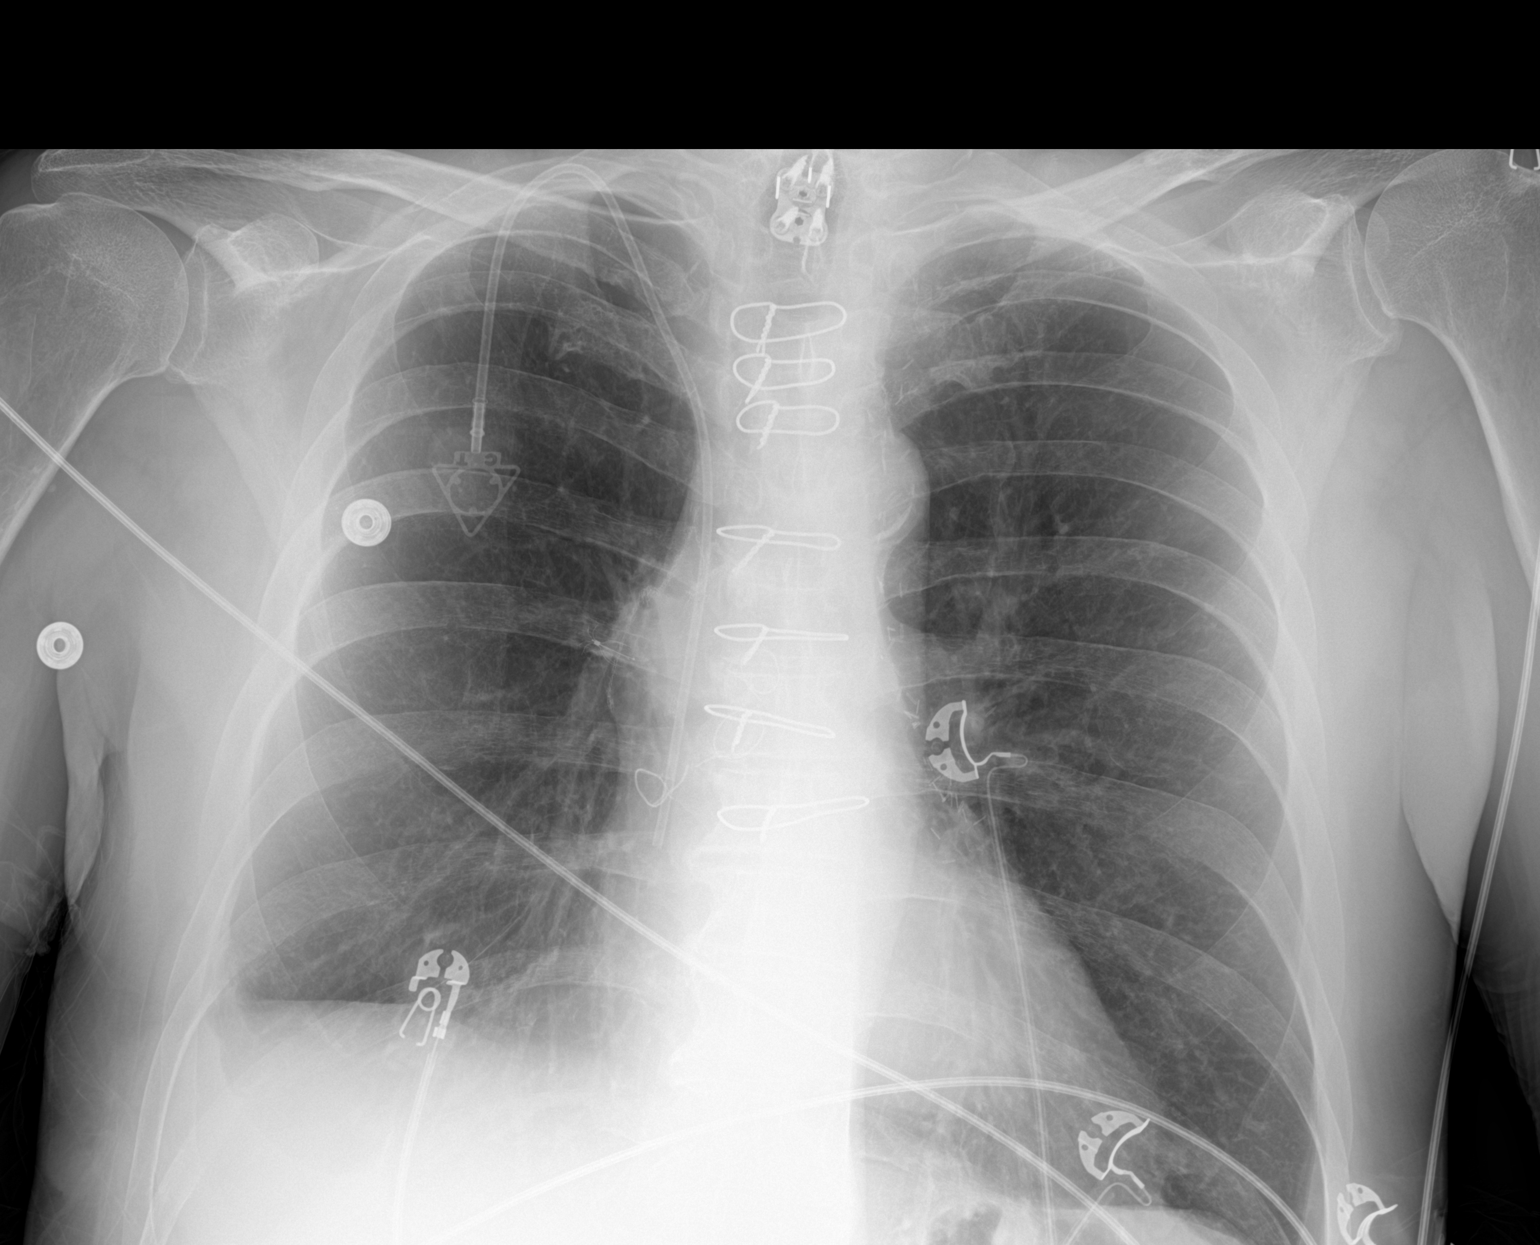

[chest ap (2 of 2)]
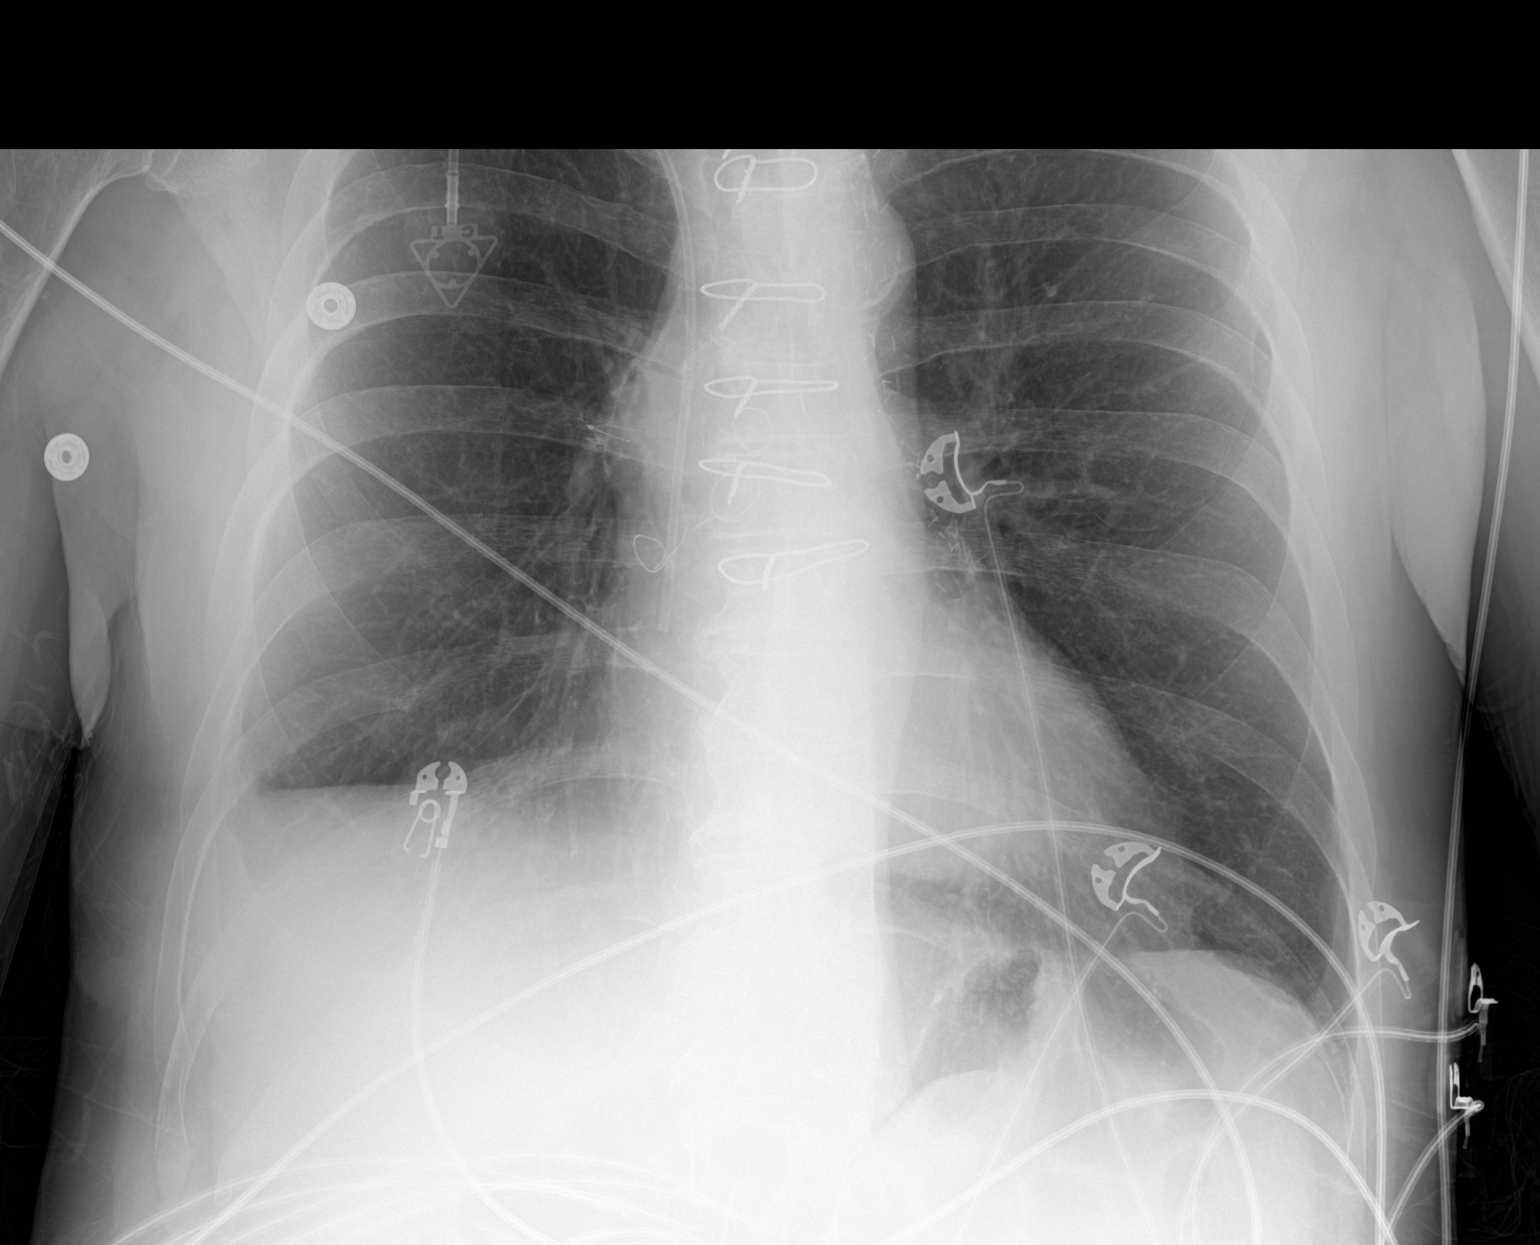

[2 of 2 positions shown; findings below may reference images not displayed]

FINDINGS: Heart size is normal. Mediastinum appears stable. Calcified plaques
in the aortic arch. Cardiac surgical changes and median sternotomy
wires. Right-sided central venous port with the tip near the
cavoatrial junction. Elevated right hemidiaphragm with chronic
stable blunting of the right costophrenic angle. No new
consolidation identified. No pleural effusion or pneumothorax
visualized.
IMPRESSION: Stable chronic changes with no acute process identified.

## 2023-06-17 IMAGING — CT CT ANGIO CHEST
2 of 6 series · 17 of 36 positions shown · IV contrast (omnipaque)
Comparison: None.

CLINICAL DATA: Pulmonary embolism (PE) suspected, positive D-dimer.
Today while going up and down stairs in his house he developed
dyspnea each time. This is new. He has a pulse ox at home and he
checked and his heart rate was in the 170s and his oxygen was in the
80s.

EXAM:
CT ANGIOGRAPHY CHEST WITH CONTRAST
TECHNIQUE: Multidetector CT imaging of the chest was performed using the
standard protocol during bolus administration of intravenous
contrast. Multiplanar CT image reconstructions and MIPs were
obtained to evaluate the vascular anatomy.
CONTRAST:  80mL OMNIPAQUE IOHEXOL 350 MG/ML SOLN

[Series 5: thins · axial · 0.78mm/px · z∈[-326,-22]mm · 16 of 342 slices shown]
[im 19/342  lung]
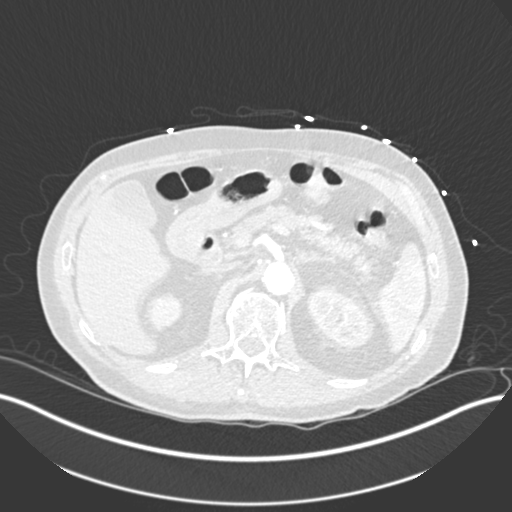
[im 38/342  mediastinal]
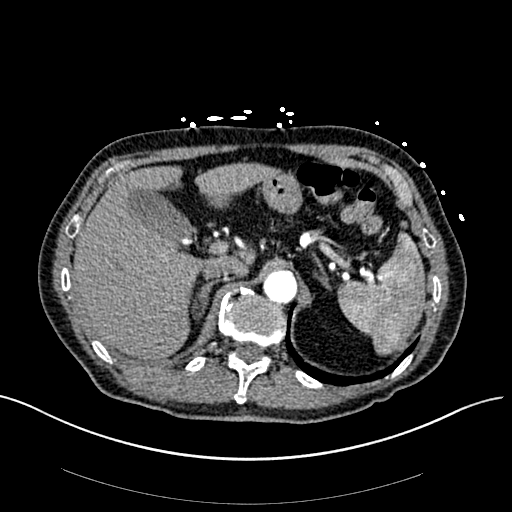
[im 57/342  lung]
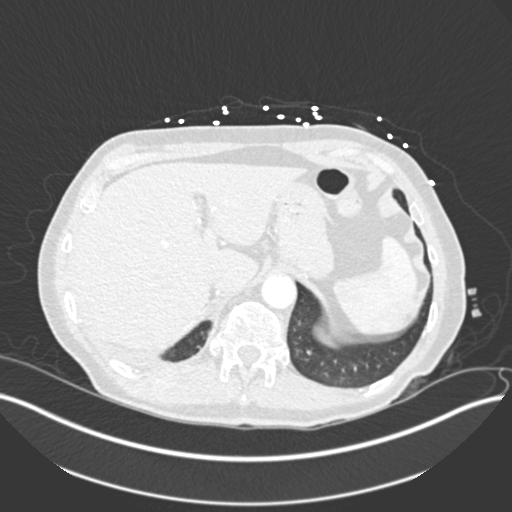
[im 76/342  mediastinal]
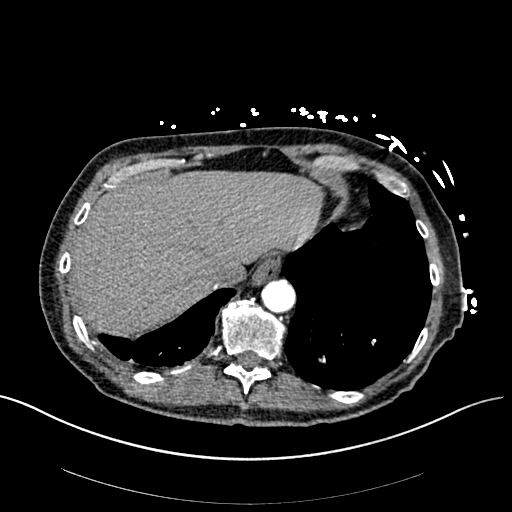
[im 95/342  lung]
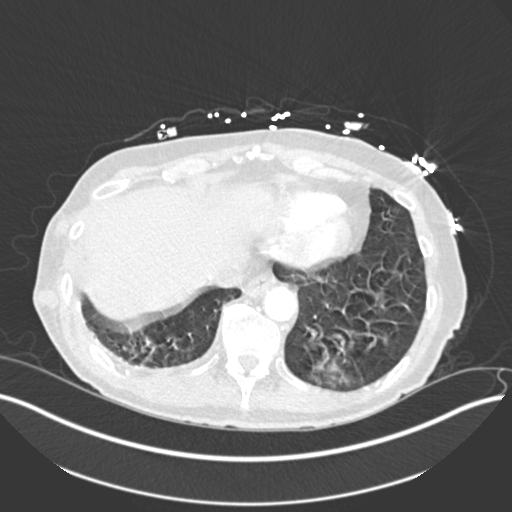
[im 114/342  mediastinal]
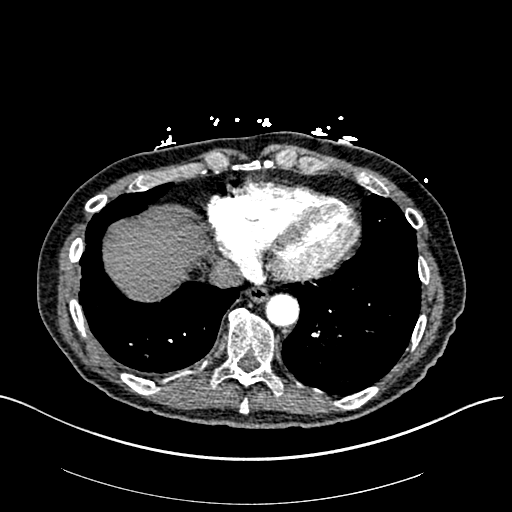
[im 133/342  lung]
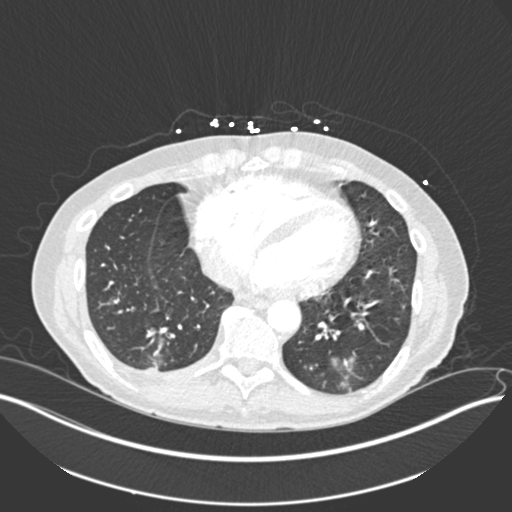
[im 152/342  mediastinal]
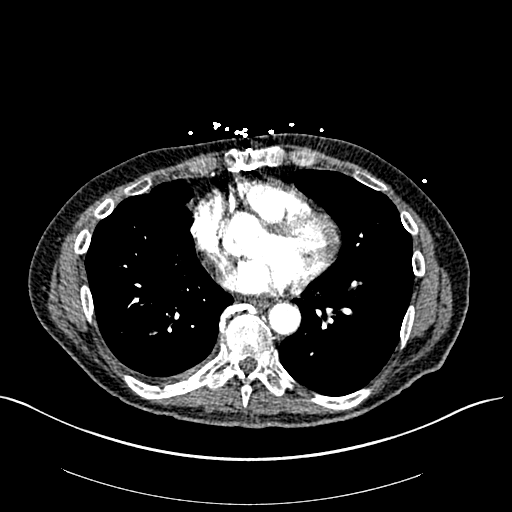
[im 190/342  lung]
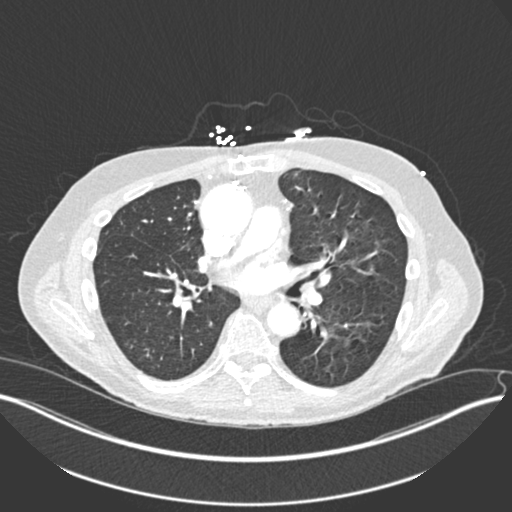
[im 209/342  mediastinal]
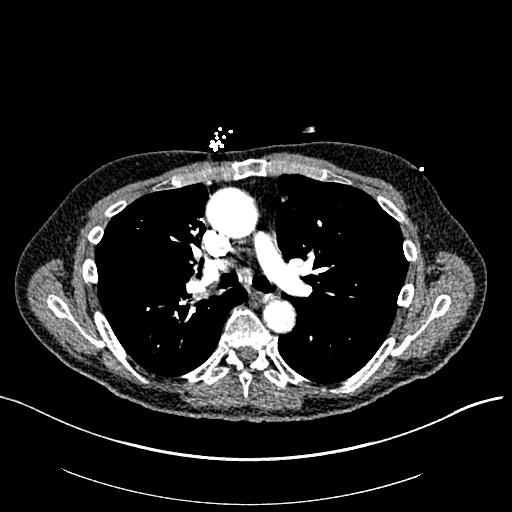
[im 228/342  lung]
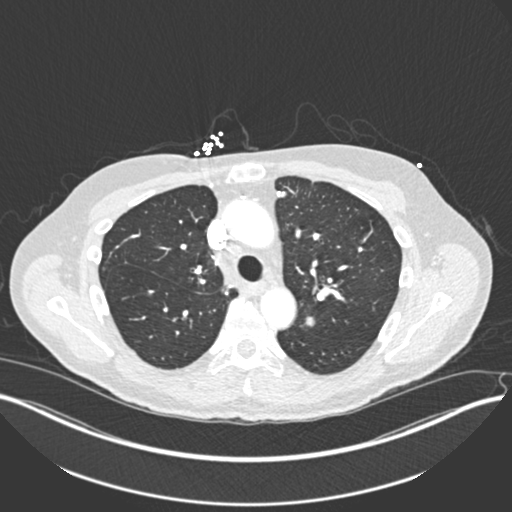
[im 247/342  mediastinal]
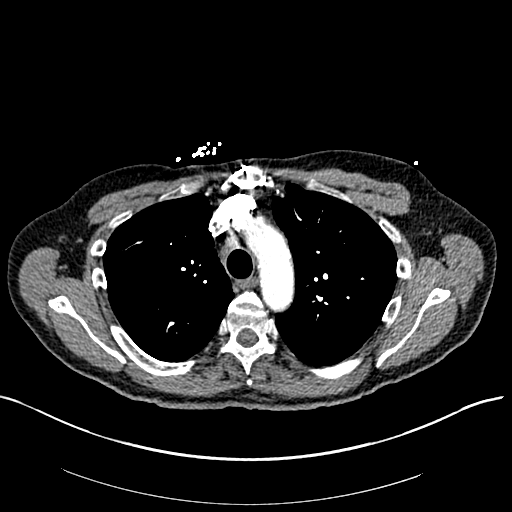
[im 266/342  lung]
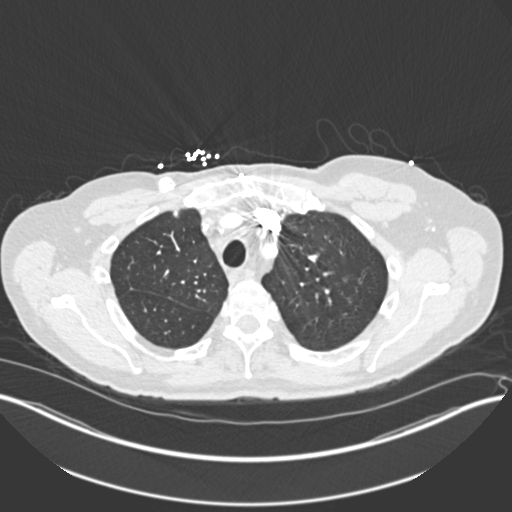
[im 285/342  mediastinal]
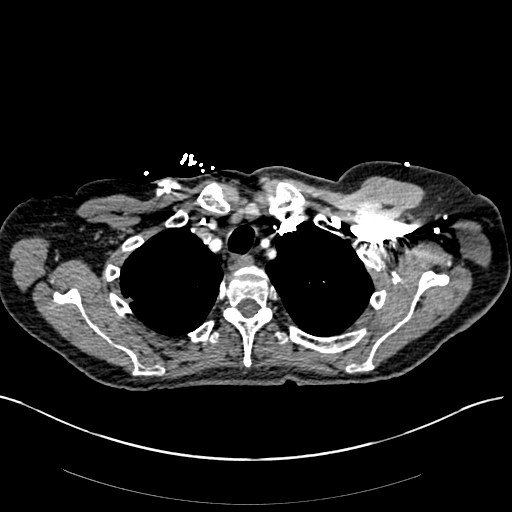
[im 304/342  lung]
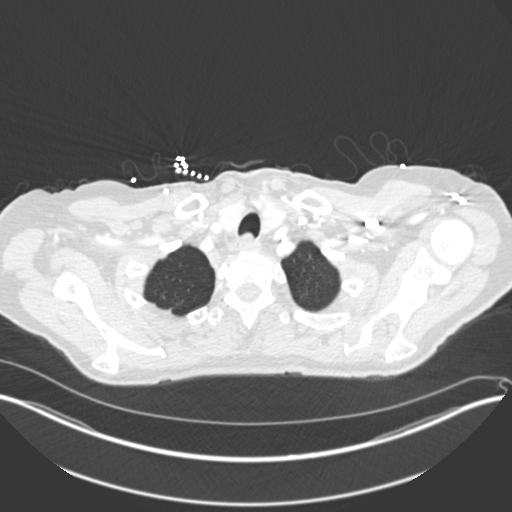
[im 323/342  mediastinal]
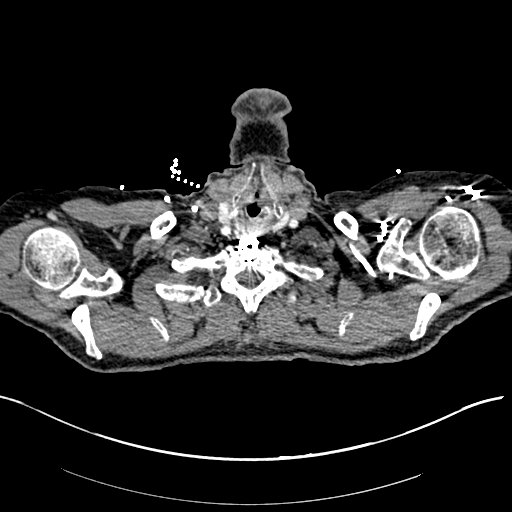

[Series 6: coronal mpr · coronal · 0.71mm/px · 1 of 121 slices shown]
[im 61/121  mediastinal]
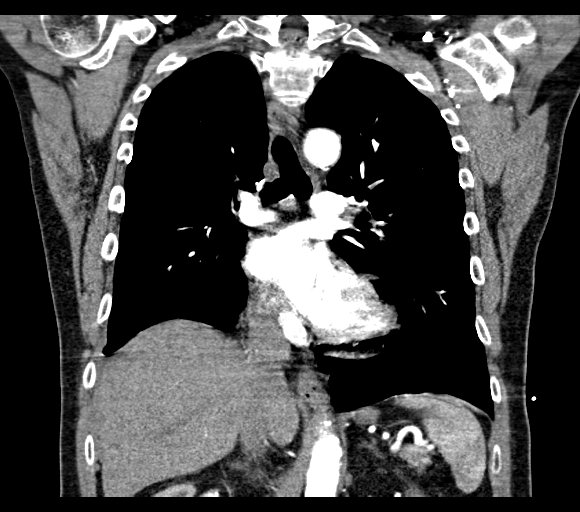

[17 of 36 positions shown; findings below may reference images not displayed]

FINDINGS: Cardiovascular: Satisfactory opacification of the pulmonary arteries
to the segmental level. Limited evaluation of the subsegmental level
due to motion artifact. No evidence of pulmonary embolism. The main
pulmonary artery is normal in caliber. Normal heart size. No
significant pericardial effusion. The thoracic aorta is normal in
caliber. At least mild atherosclerotic plaque of the thoracic aorta.
Four-vessel coronary artery calcifications status post coronary
artery bypass graft.

Mediastinum/Nodes: No enlarged mediastinal, hilar, or axillary lymph
nodes. Thyroid gland, trachea, and esophagus demonstrate no
significant findings.

Lungs/Pleura: Interval development of peribronchovascular
ground-glass airspace opacities within the left lower lobe. Interval
decrease in persistent right lower lobe peribronchovascular
ground-glass airspace opacities. Interval increase in site of a left
upper lobe 1.1 x 0.8 cm pulmonary nodule (from 0.6 cm). No
pulmonary mass. Trace right pleural effusion. No left pleural
effusion. No pneumothorax.

Upper Abdomen: Splenule noted. Partially visualized fluid density
lesion within the left kidney. No acute abnormality.

Musculoskeletal:

Interval development of soft tissue densities within the right chest
wall measuring 1 x 0.8 cm and 1.6 x 1.7 cm (5: 246-252).

Interval increase in size of lytic lesions along the posterior
cortex of the right eleventh rib ([DATE]) with a nondisplaced
pathologic fracture through the rib not excluded ([DATE]). Interval
development of a couple of lytic lesion within the right ninth rib:
Along the anterior cortex ([DATE]) in the posterior cortex through
the anterior cortex ([DATE]).

Old healed right eighth rib fracture anterolaterally.

Multilevel degenerative changes of the spine. Anterior and interbody
C7-T1 fusion with surgical hardware.

Review of the MIP images confirms the above findings.
IMPRESSION: 1. Interval increase in size and interval development of a new right
rib lytic lesions concerning for osseous metastases. A pathologic
nondisplaced fracture of the right eleventh rib is not excluded.
2. Interval development of soft tissue densities within the right
chest wall measuring 1 x 0.8 cm and 1.6 x 1.7 cm. These are
concerning for metastases given right chest wall hypermetabolic
activity on PET CT 07/03/2021.
[DATE]. Interval increase in site of a left upper lobe 1.1 x 0.8 cm
pulmonary nodule (from 0.6 cm).
4. Interval development of peribronchovascular ground-glass airspace
opacities within the left lower lobe. Interval decrease in
persistent right lower lobe peribronchovascular ground-glass
airspace opacities. Findings suggestive of infection/inflammation.
Underlying malignancy not excluded.
5. Trace right pleural effusion.

## 2023-06-28 IMAGING — DX DG CHEST 1V PORT
2 series · 2 of 2 positions shown · non-contrast
Comparison: Chest x-ray 08/18/2021.

CLINICAL DATA: Chest pain.

EXAM:
PORTABLE CHEST 1 VIEW

[chest ap (1 of 2)]
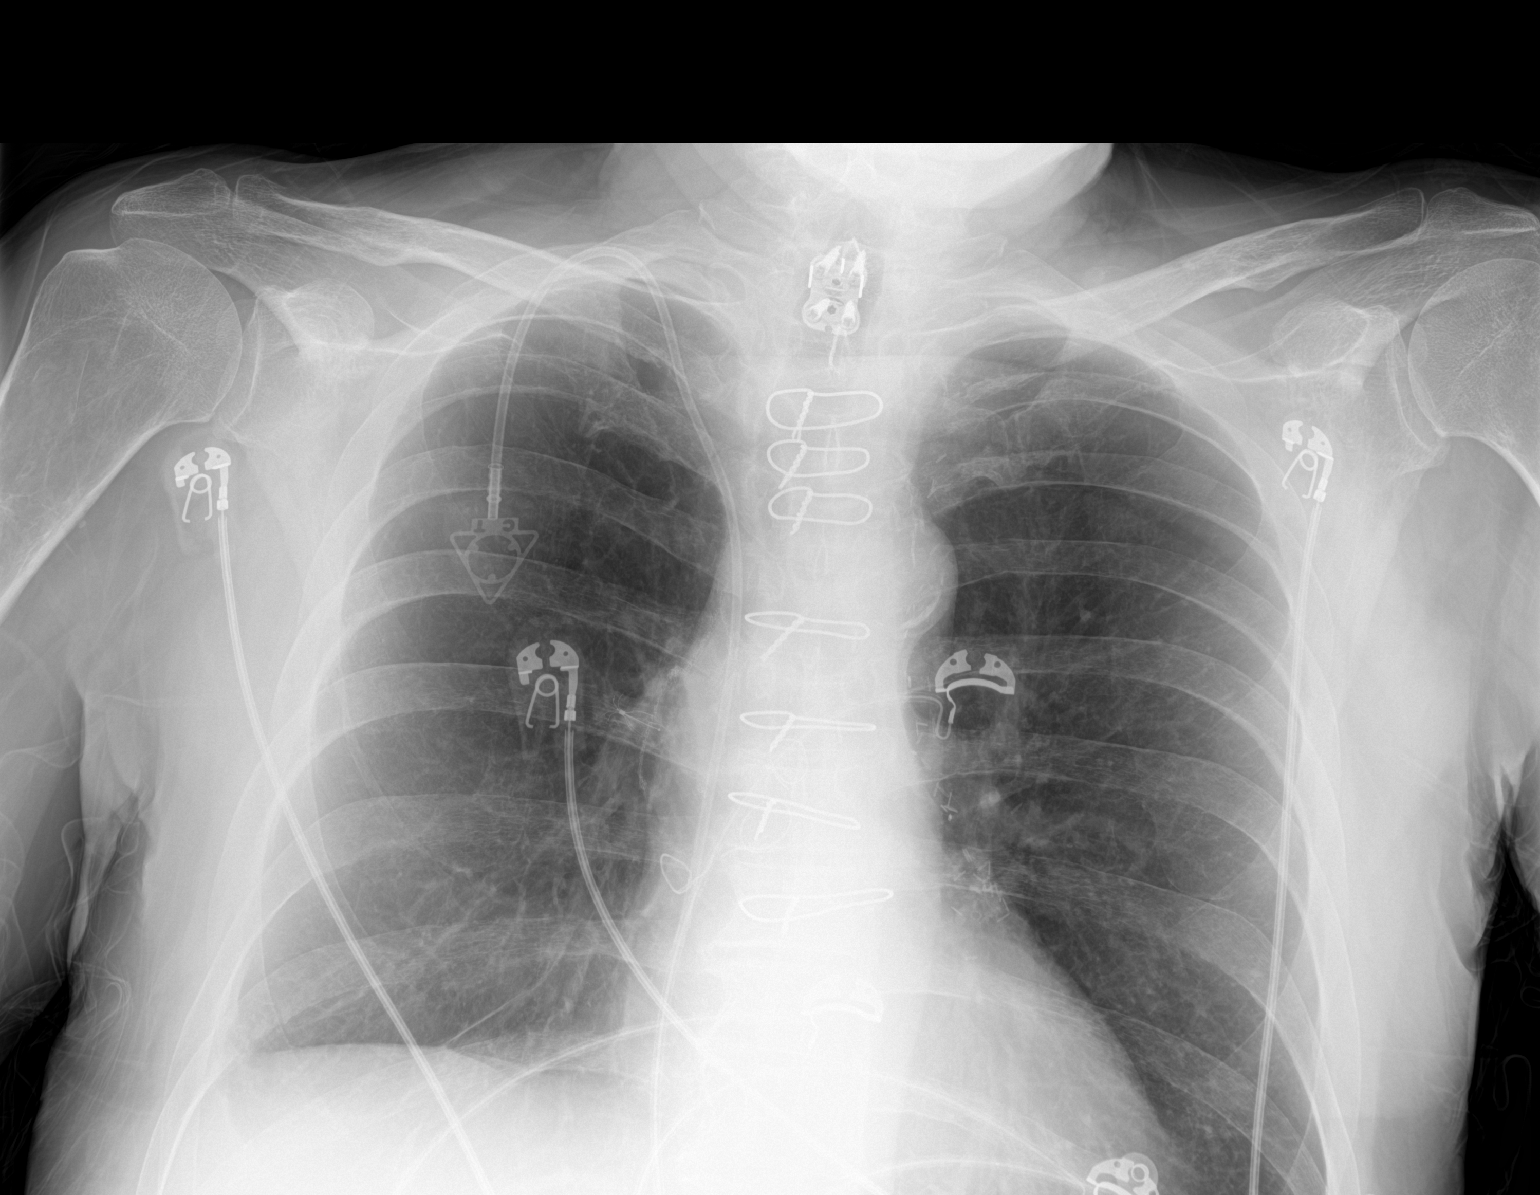

[chest ap (2 of 2)]
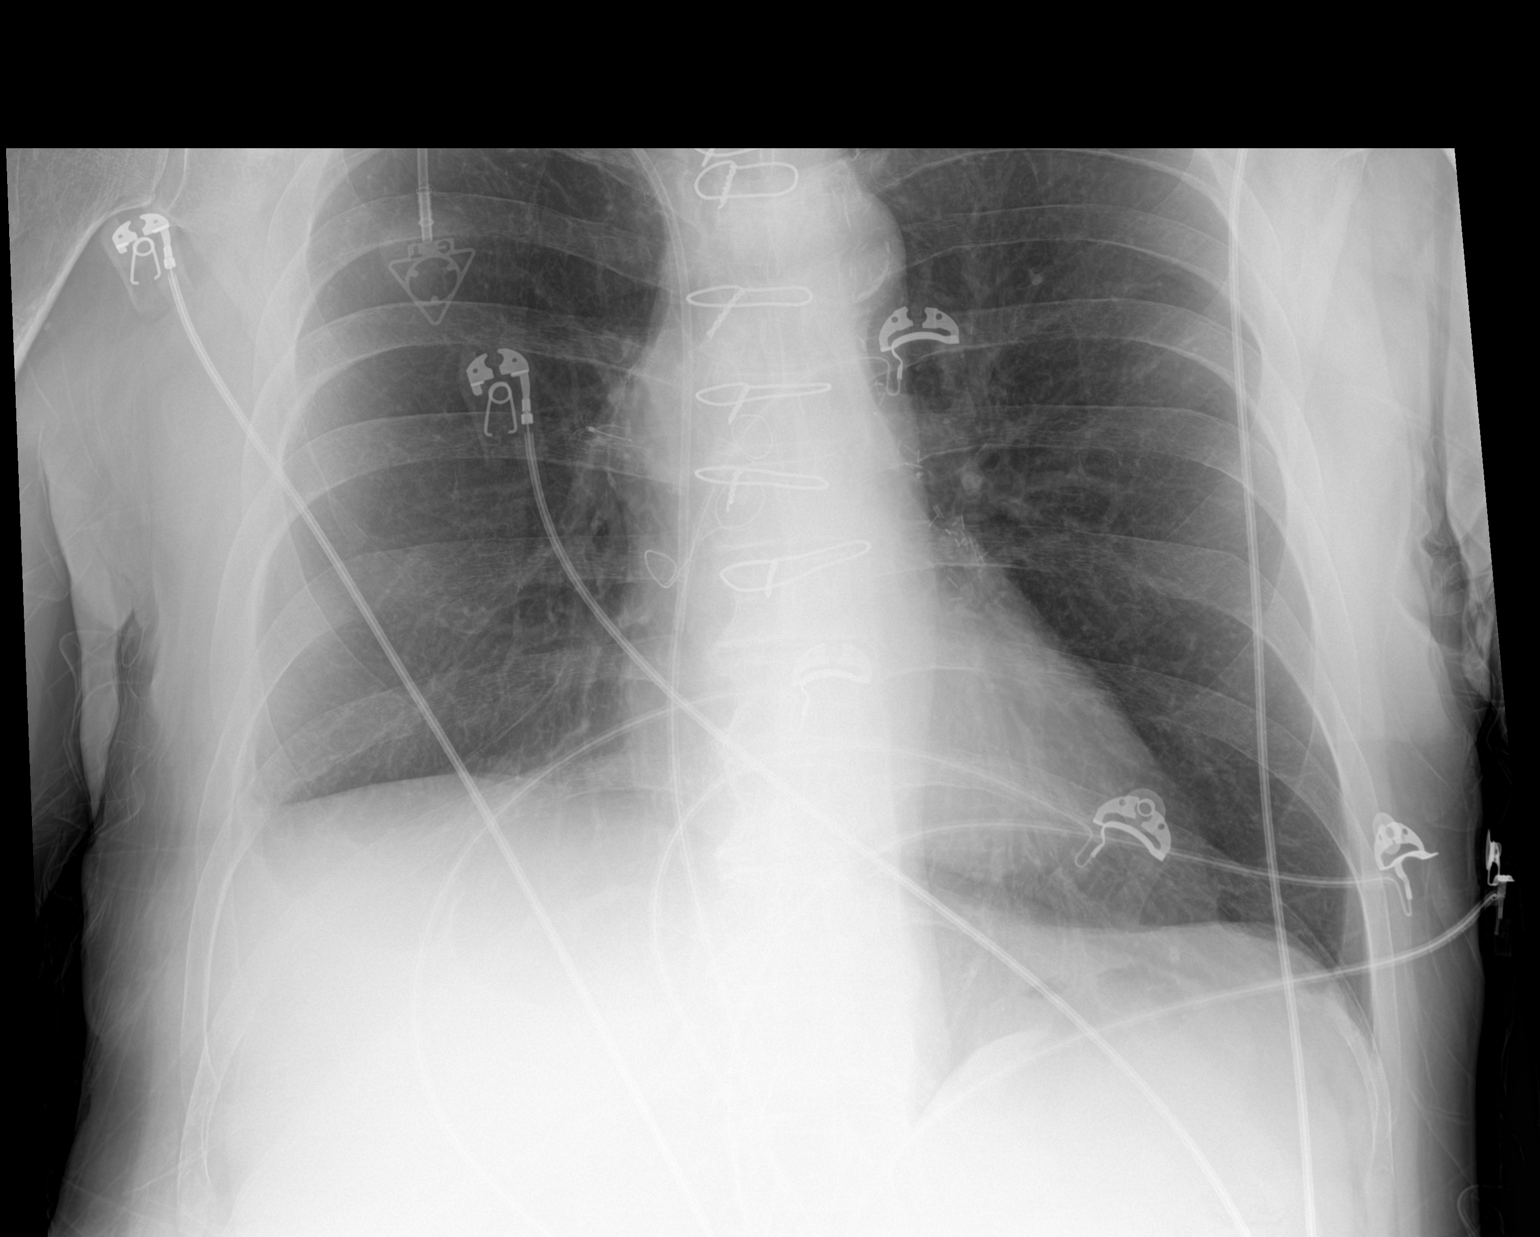

[2 of 2 positions shown; findings below may reference images not displayed]

FINDINGS: Cardiomediastinal silhouette is within normal limits. Patient is
status post cardiac surgery. Right chest port catheter tip projects
over the distal SVC. There is no focal lung infiltrate, pleural
effusion or pneumothorax. Cervical spinal fusion plate is present.
No acute fractures are seen.
IMPRESSION: No acute cardiopulmonary process.

## 2023-07-14 IMAGING — DX DG CHEST 2V
2 series · 2 of 2 positions shown · non-contrast
Comparison: August 29, 2021

CLINICAL DATA: Right lower abdominal pain with cough and nausea.

EXAM:
CHEST - 2 VIEW

[chest pa]
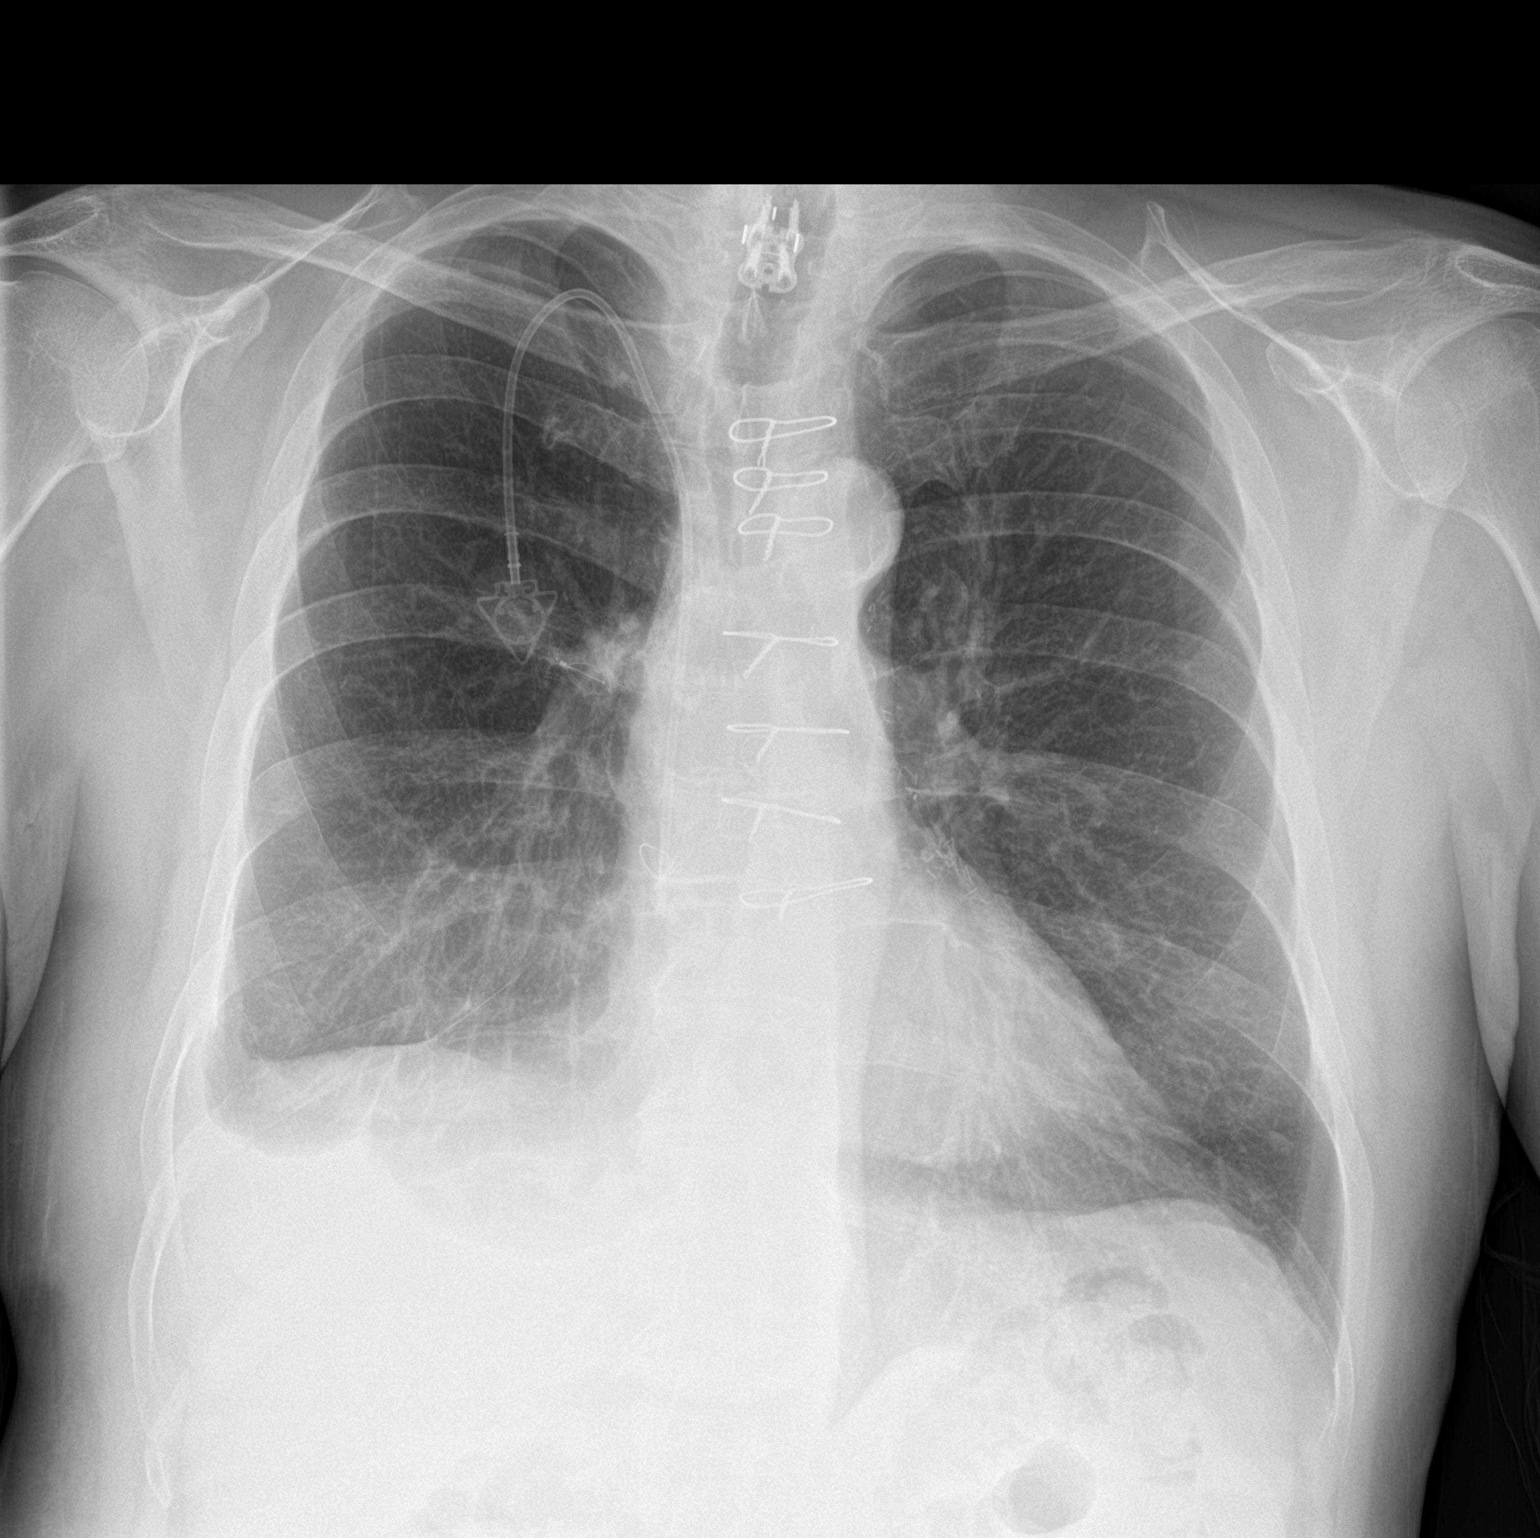

[chest lat]
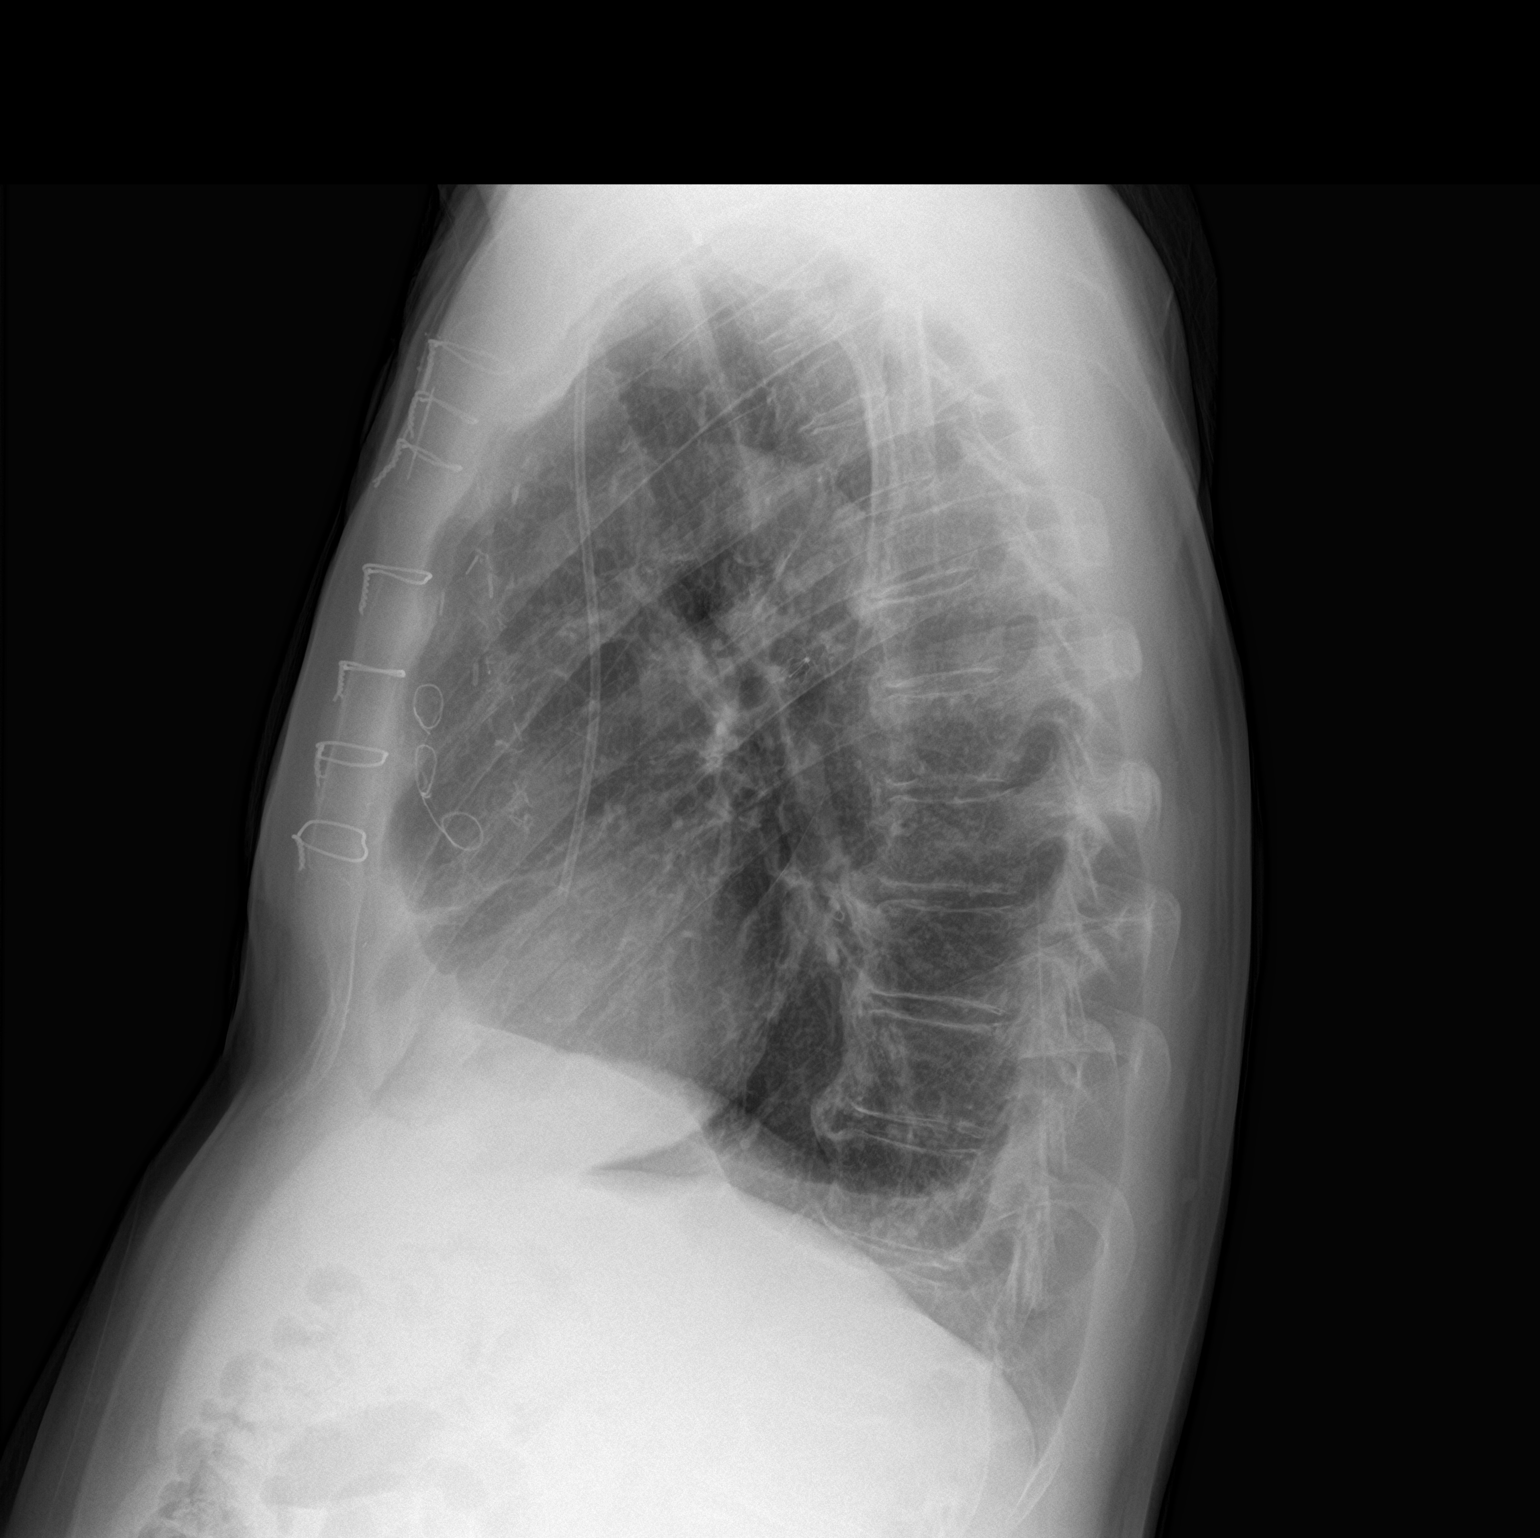

[2 of 2 positions shown; findings below may reference images not displayed]

FINDINGS: There is stable right-sided venous Port-A-Cath positioning. Multiple
sternal wires and vascular clips are seen. Mild atelectasis is noted
within the right lung base. A small right pleural effusion is
present. No pneumothorax is identified. The heart size and
mediastinal contours are within normal limits. A radiopaque fusion
plate and screws are seen overlying the lower cervical spine. The
visualized skeletal structures are otherwise unremarkable.
IMPRESSION: 1. Stable postoperative changes with mild right basilar atelectasis.
2. Small right pleural effusion.

## 2023-07-15 IMAGING — CT CT ANGIO CHEST
2 of 6 series · 17 of 36 positions shown · IV contrast (agent unspecified)
Comparison: August 18, 2021

CLINICAL DATA: Dyspnea with right-sided abdominal pain and flank
pain.

EXAM:
CT ANGIOGRAPHY CHEST WITH CONTRAST
TECHNIQUE: Multidetector CT imaging of the chest was performed using the
standard protocol during bolus administration of intravenous
contrast. Multiplanar CT image reconstructions and MIPs were
obtained to evaluate the vascular anatomy.

[Series 5: pe thins · axial · 0.93mm/px · z∈[+978,+1284]mm · 16 of 341 slices shown]
[im 18/341  lung]
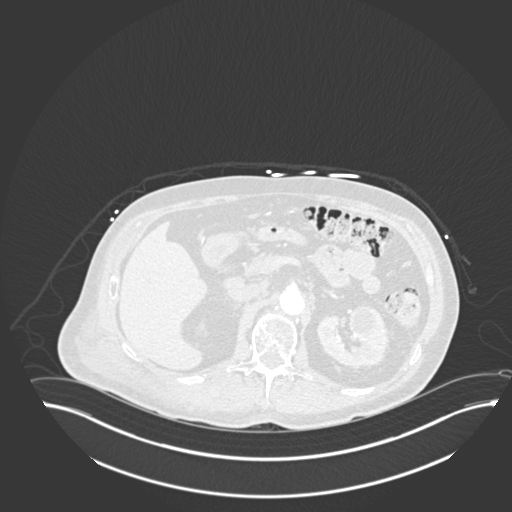
[im 35/341  mediastinal]
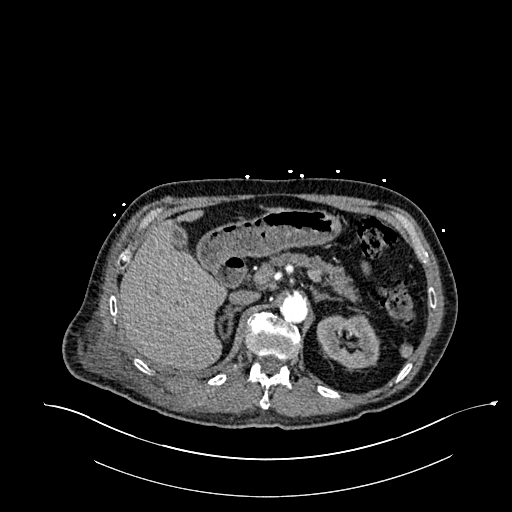
[im 52/341  lung]
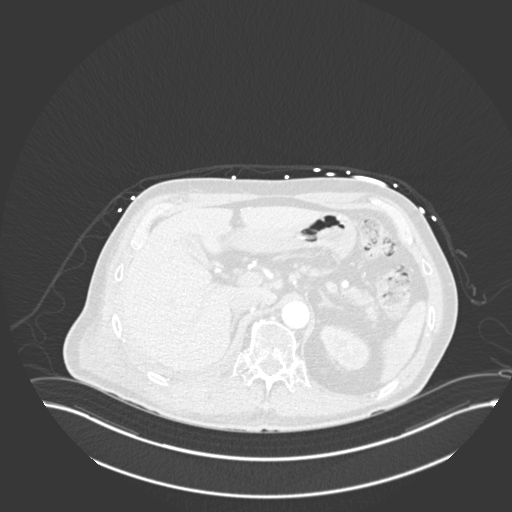
[im 86/341  mediastinal]
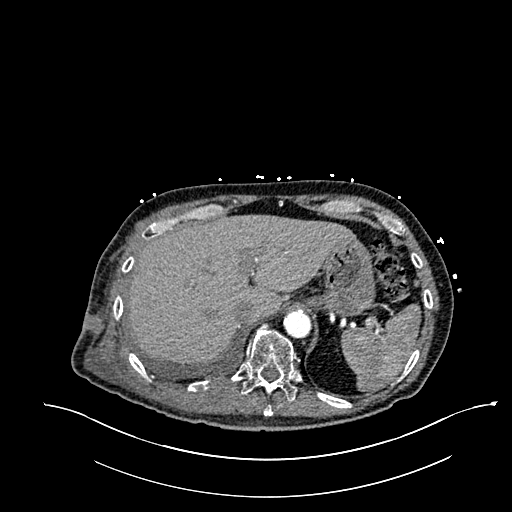
[im 103/341  lung]
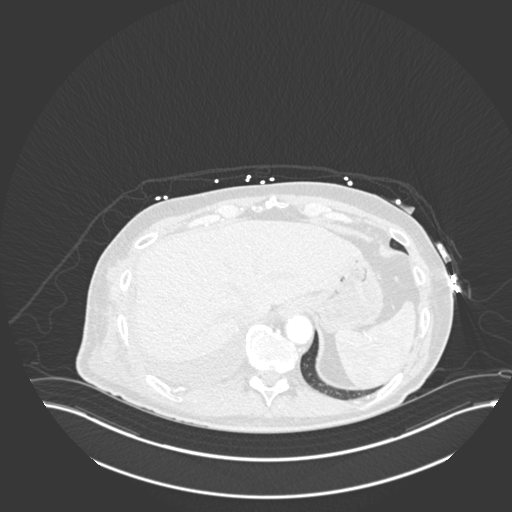
[im 120/341  mediastinal]
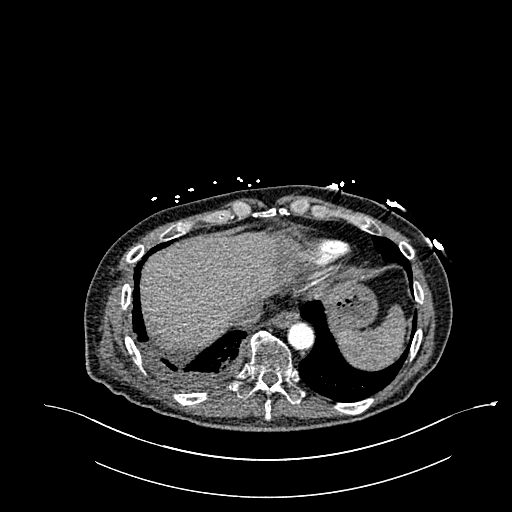
[im 137/341  lung]
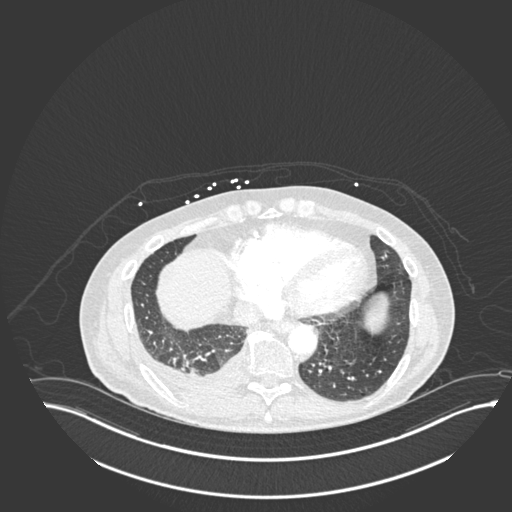
[im 154/341  mediastinal]
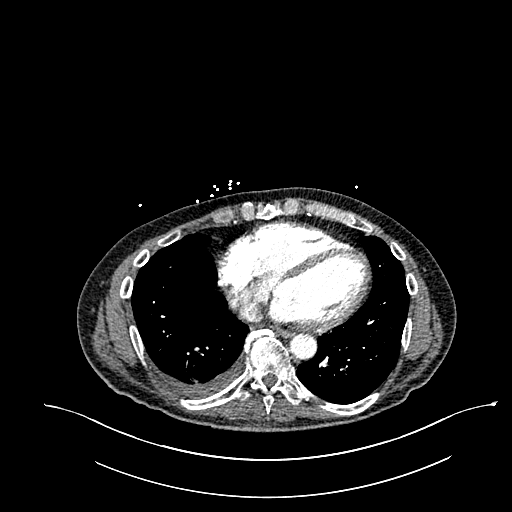
[im 188/341  lung]
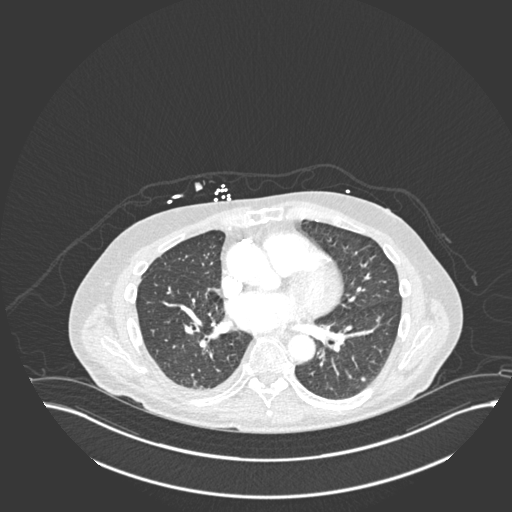
[im 205/341  mediastinal]
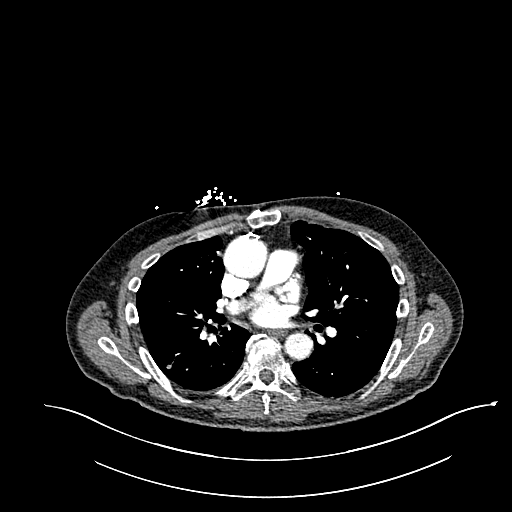
[im 222/341  lung]
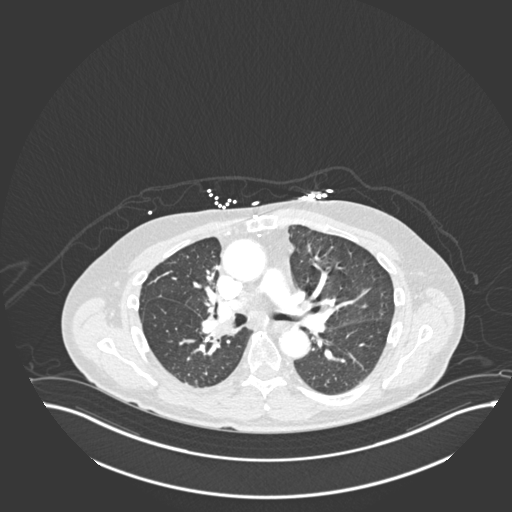
[im 239/341  mediastinal]
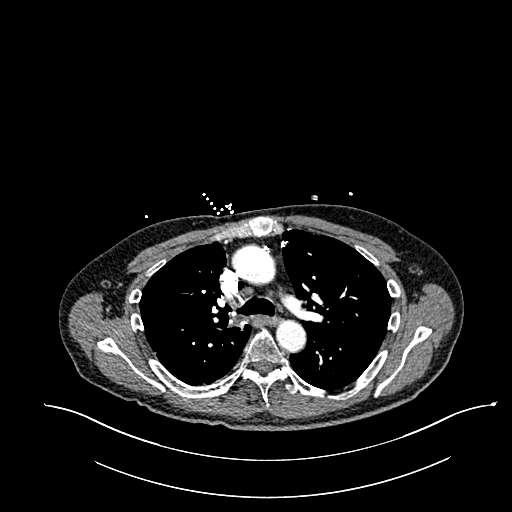
[im 256/341  lung]
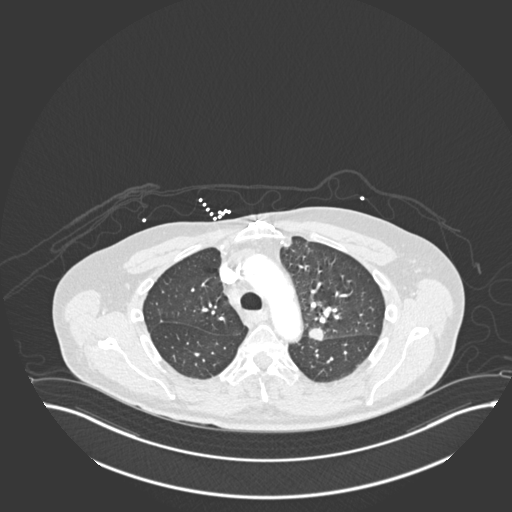
[im 290/341  mediastinal]
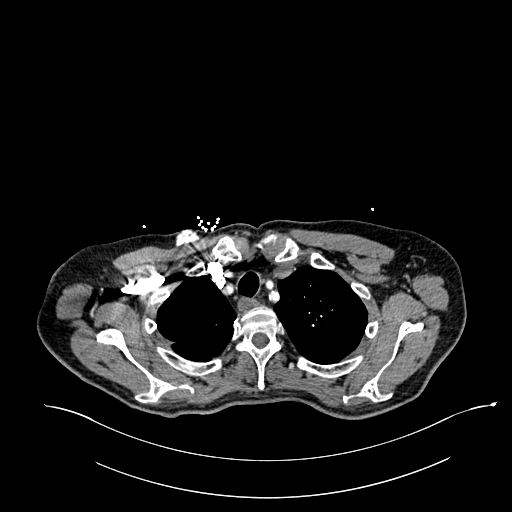
[im 307/341  lung]
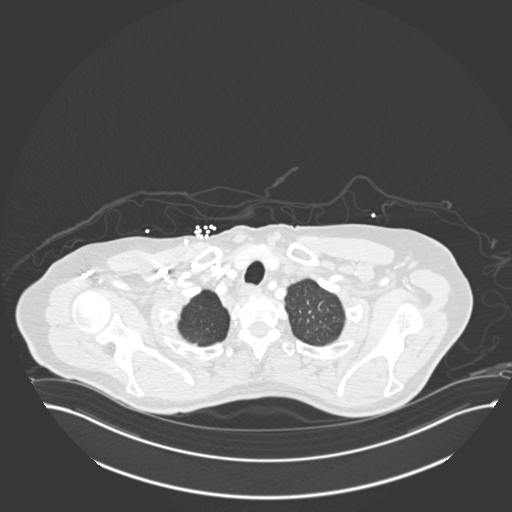
[im 324/341  mediastinal]
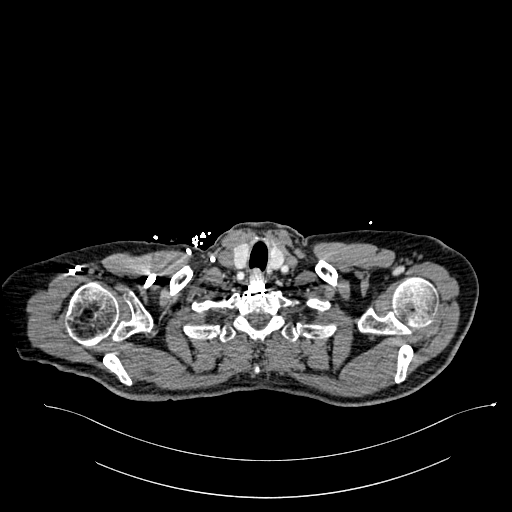

[Series 6: pe coronal mpr · coronal · 0.66mm/px · 1 of 116 slices shown]
[im 58/116  mediastinal]
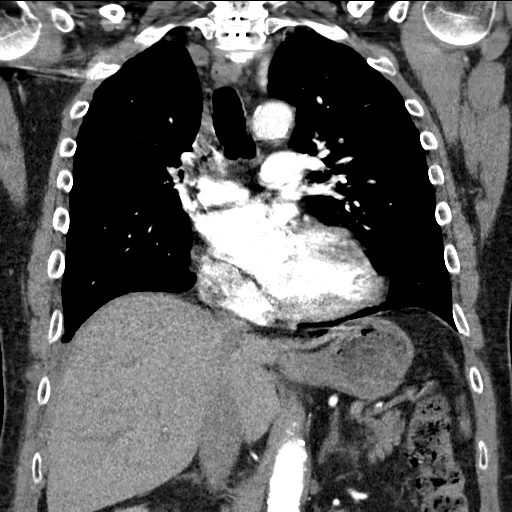

[17 of 36 positions shown; findings below may reference images not displayed]

RADIATION DOSE REDUCTION: This exam was performed according to the
departmental dose-optimization program which includes automated
exposure control, adjustment of the mA and/or kV according to
patient size and/or use of iterative reconstruction technique.

CONTRAST:  100mL OMNIPAQUE IOHEXOL 350 MG/ML SOLN
FINDINGS: Cardiovascular: A right-sided venous Port-A-Cath is in place. There
is mild to moderate severity calcification of the aortic arch.
Satisfactory opacification of the pulmonary arteries to the
segmental level. No evidence of pulmonary embolism. Normal heart
size. No pericardial effusion.

Mediastinum/Nodes: No enlarged mediastinal, hilar, or axillary lymph
nodes. Thyroid gland, trachea, and esophagus demonstrate no
significant findings.

Lungs/Pleura: A 12 mm x 11 mm noncalcified lung nodule is seen
within the posteromedial aspect of the left upper lobe (axial CT
image 29, CT series 4). This measures approximately 11 mm x 9 mm on
the prior study.

6 mm and 4 mm noncalcified lung nodules are seen within the
posterior aspect of the right upper lobe (axial CT images 37 and 46,
CT series 2). These are new findings when compared to the prior
study.

New 3 mm and 6 mm noncalcified lung nodules are seen within the
posterolateral aspects of the right lower lobe (axial CT images 52,
59 and 66, CT series 4).

Mild atelectatic changes are seen within the posterior aspect of the
right lung base.

A very small right pleural effusion is seen.

No pneumothorax is identified.

Upper Abdomen: A stable simple cyst is seen within the left kidney.

Musculoskeletal: Multiple sternal wires are present.

Lytic lesions are again seen involving multiple right ribs. A
pathologic fracture of the posterior aspect of the eleventh right
rib is again seen.

There has been interval enlargement of the soft tissue lesions seen
within the posterior right chest wall with pleural invasion along
the posteromedial aspect of the right lung base.

A metallic density fixation plate and screws are seen along the
anterior aspect of the lower cervical spine.

The level degenerative changes are seen throughout the thoracic
spine.

Review of the MIP images confirms the above findings.
IMPRESSION: 1. No evidence for pulmonary embolism.
2. Very mild interval increase in size of the previously noted left
upper lobe lung nodule, with multiple new bilateral noncalcified
lung nodules, consistent with pulmonary metastasis.
3. Very small right pleural effusion.
4. Interval enlargement of the soft tissue mass is seen within the
posterior right chest wall with pleural invasion along right lung
base.
5. Multiple right-sided lytic lesions with a pathologic fracture of
the posterior eleventh right rib.

Aortic Atherosclerosis ([id number].F).

## 2023-07-15 IMAGING — CT CT ABD-PELV W/ CM
2 of 7 series · 15 of 46 positions shown, 17 images · IV contrast (agent unspecified)
Comparison: May 05, 2021

CLINICAL DATA: Right-sided abdominal pain and flank pain.

EXAM:
CT ABDOMEN AND PELVIS WITH CONTRAST
TECHNIQUE: Multidetector CT imaging of the abdomen and pelvis was performed
using the standard protocol following bolus administration of
intravenous contrast.

[Series 5: axial st · axial · 0.95mm/px · z∈[+657,+1077]mm · 12 of 96 slices shown, 14 images]
[im 6/96  soft-tissue]
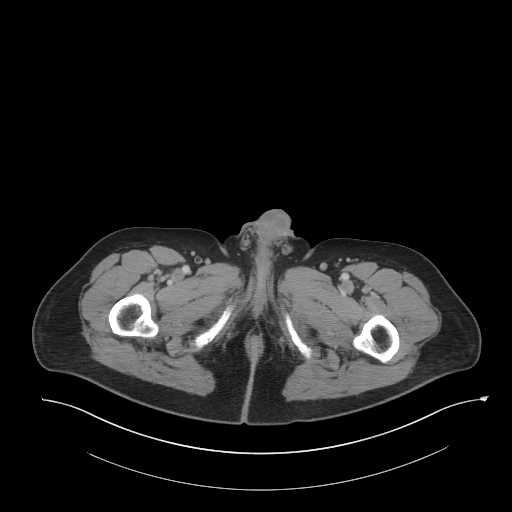
[im 6/96  bone]
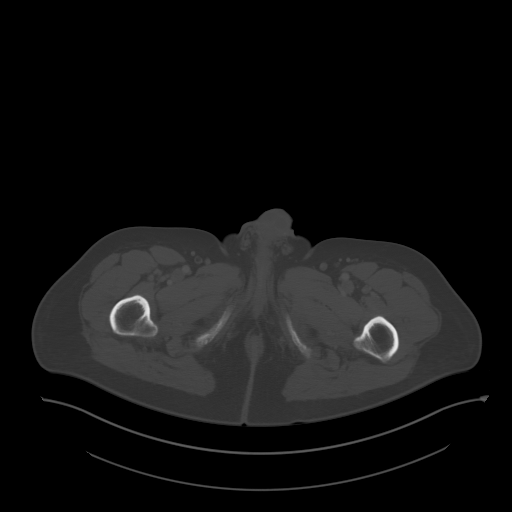
[im 16/96  soft-tissue]
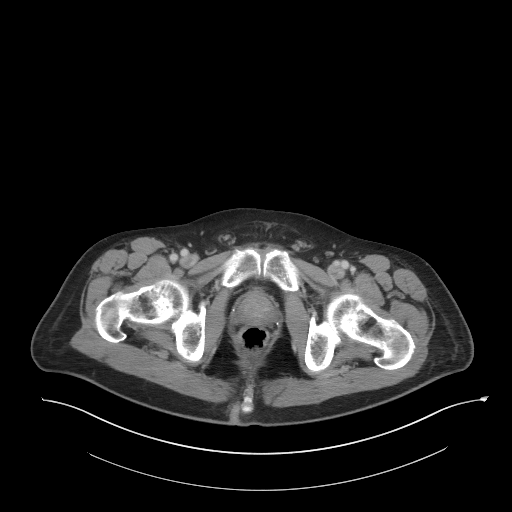
[im 22/96  soft-tissue]
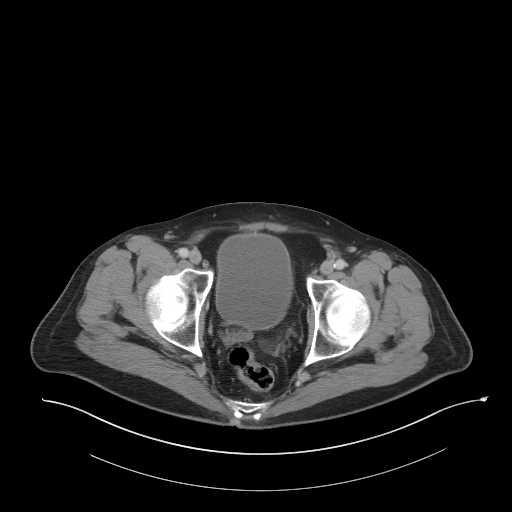
[im 27/96  soft-tissue]
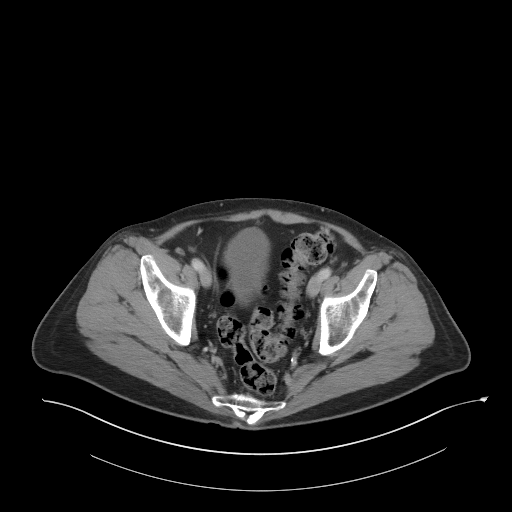
[im 37/96  soft-tissue]
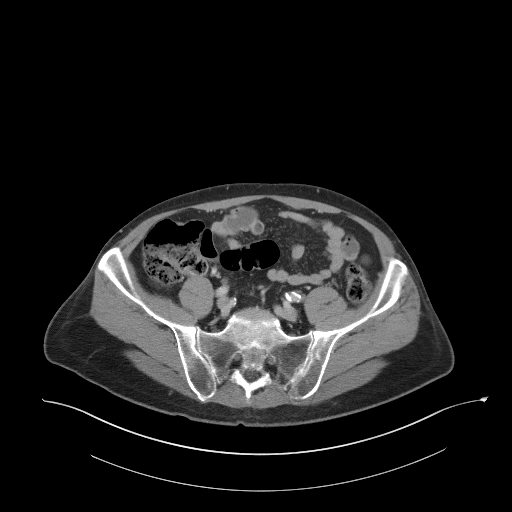
[im 43/96  soft-tissue]
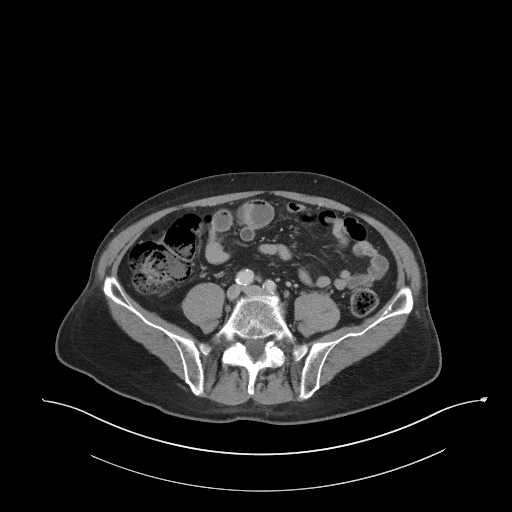
[im 53/96  soft-tissue]
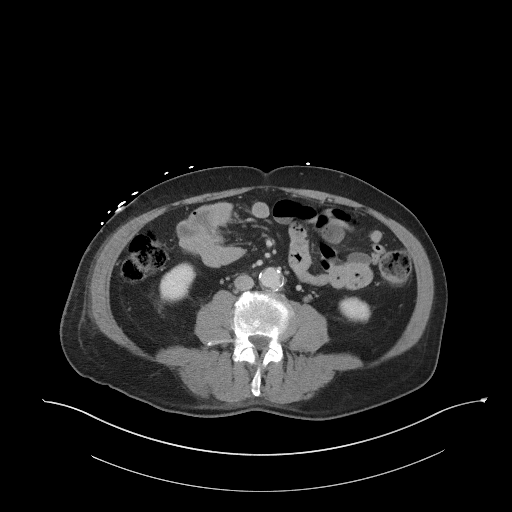
[im 59/96  soft-tissue]
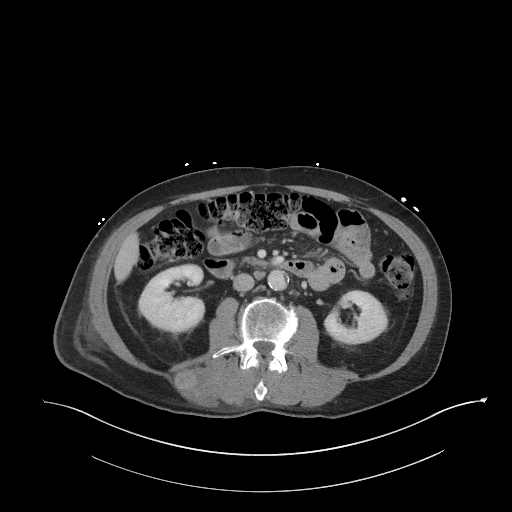
[im 69/96  soft-tissue]
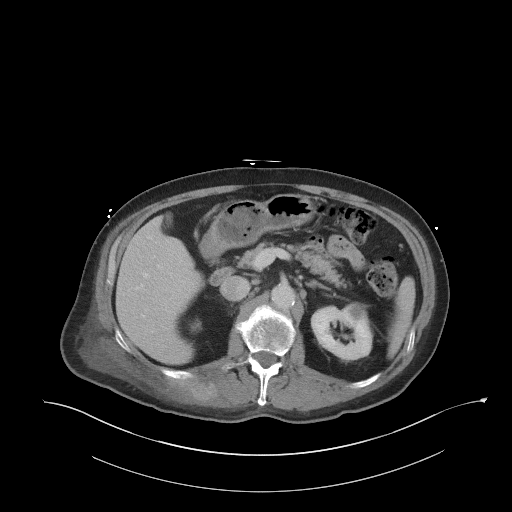
[im 69/96  bone]
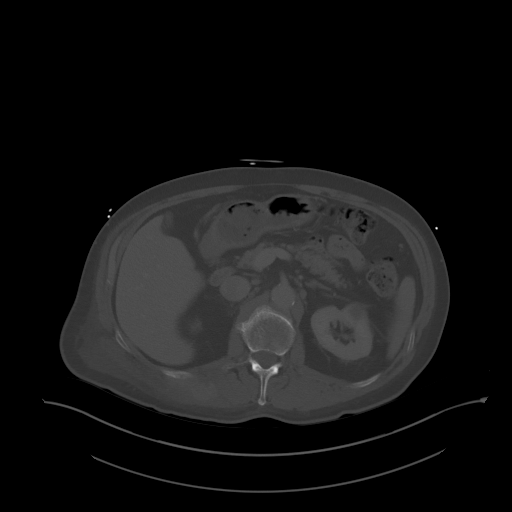
[im 74/96  soft-tissue]
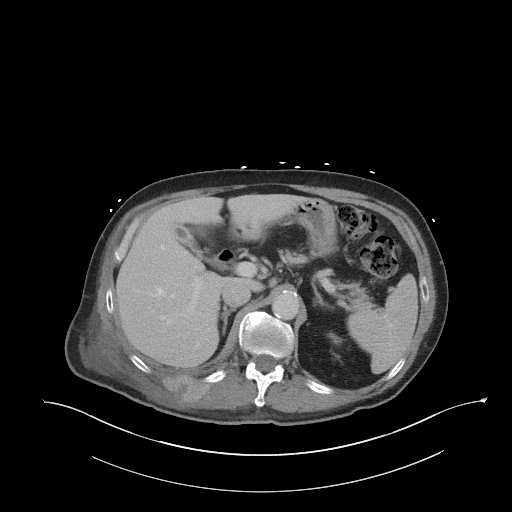
[im 80/96  soft-tissue]
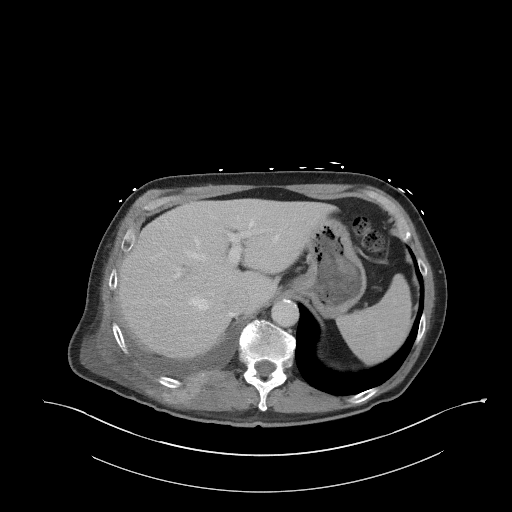
[im 90/96  soft-tissue]
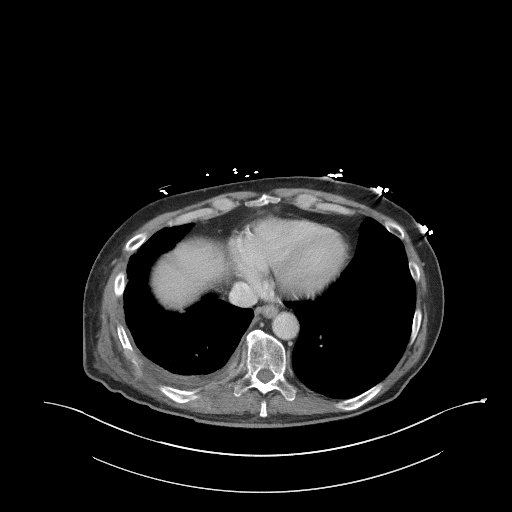

[Series 8: abd/pel coronal st · coronal · 0.77mm/px · 3 of 84 slices shown]
[im 28/84  soft-tissue]
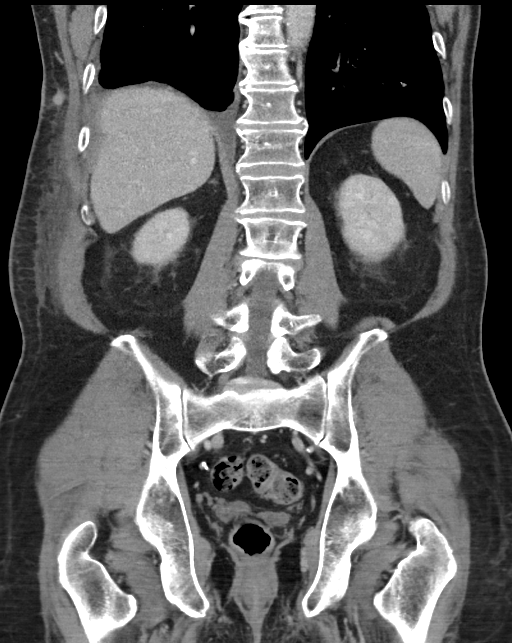
[im 37/84  soft-tissue]
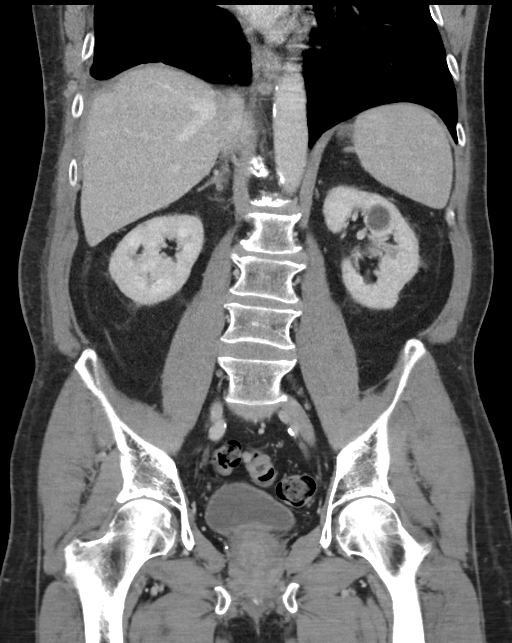
[im 47/84  soft-tissue]
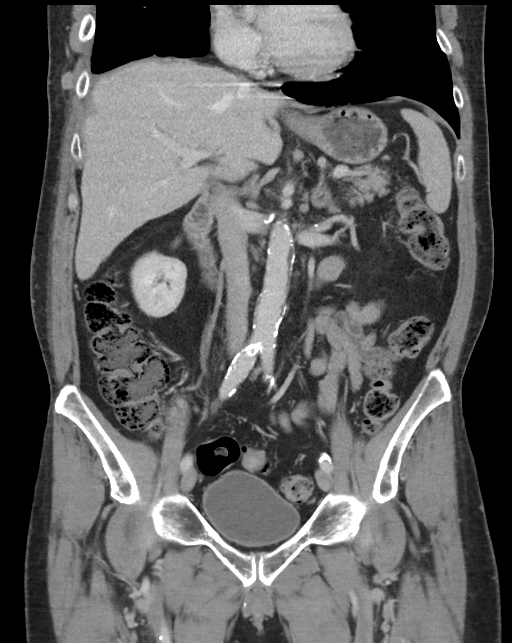

[15 of 46 positions shown; findings below may reference images not displayed]

RADIATION DOSE REDUCTION: This exam was performed according to the
departmental dose-optimization program which includes automated
exposure control, adjustment of the mA and/or kV according to
patient size and/or use of iterative reconstruction technique.

CONTRAST:  100mL OMNIPAQUE IOHEXOL 350 MG/ML SOLN
FINDINGS: Lower chest: Multiple subcentimeter noncalcified lung nodules are
seen within the posterolateral aspect of the left lung base.

Mild posterior right basilar scarring and/or atelectasis is noted.

A small right pleural effusion is seen.

Hepatobiliary: Punctate foci of parenchymal low attenuation are seen
within the anterior aspect of the liver dome and inferior aspect of
the right lobe of the liver (axial CT images 10 and 32, CT series
5). These are too small to characterize by CT examination. No
gallstones, gallbladder wall thickening, or biliary dilatation.

Pancreas: Unremarkable. No pancreatic ductal dilatation or
surrounding inflammatory changes.

Spleen: Normal in size without focal abnormality.

Adrenals/Urinary Tract: Adrenal glands are unremarkable. Kidneys are
normal in size, without renal calculi or hydronephrosis. A stable
simple cyst is seen within the anterior aspect of the mid left
kidney. Bladder is unremarkable.

Stomach/Bowel: Stomach is within normal limits. Appendix appears
normal. No evidence of bowel wall thickening, distention, or
inflammatory changes. Noninflamed diverticula are seen within the
sigmoid colon.

Vascular/Lymphatic: Aortic atherosclerosis. No enlarged abdominal or
pelvic lymph nodes.

Reproductive: The prostate gland is mildly enlarged.

Other: No abdominal wall hernia or abnormality. No abdominopelvic
ascites.

Musculoskeletal: Multiple soft tissue masses are seen within the
musculature and subcutaneous fat of the lateral and posterior
aspects of the lower right chest wall. Pleural invasion is noted
within the posterior aspect of the right lung base. These soft
tissue masses extend along the posterior wall of the mid and upper
right abdomen. This represents a new finding.

Lytic lesions are seen involving multiple right ribs with a
pathologic fracture noted involving the posterior eleventh right
rib.

Degenerative changes are seen within the lumbar spine.
IMPRESSION: 1. Multiple neoplastic soft tissue masses within the lateral and
posterior aspects of the lower right chest wall with associated
pleural invasion.
2. Lytic lesions involving multiple right ribs with a pathologic
fracture of the posterior eleventh right rib.
3. Multiple subcentimeter noncalcified lung nodules within the left
lung base, consistent with pulmonary metastatic disease.
4. Sigmoid diverticulosis.
5. Small right pleural effusion.
6. Aortic atherosclerosis.

Aortic Atherosclerosis ([id number]-T77.7).

## 2023-07-20 NOTE — Telephone Encounter (Signed)
Telephone call  

## 2023-08-01 IMAGING — CT CT ABD-PELV W/ CM
2 of 4 series · 14 of 46 positions shown, 16 images · IV contrast (agent unspecified)
Comparison: Chest CT angiogram dated 09/15/2021. CT abdomen and
pelvis dated 09/15/2021.

CLINICAL DATA: Lung cancer patient with bone metastasis.
Generalized abdominal pain and low back pain status post
commencement of immunotherapy.



[Series 3: axial st · axial · 0.77mm/px · z∈[-508,-98]mm · 11 of 94 slices shown, 13 images]
[im 6/94  soft-tissue]
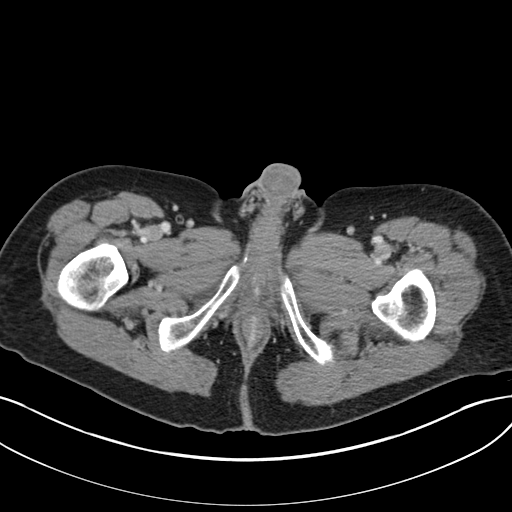
[im 6/94  bone]
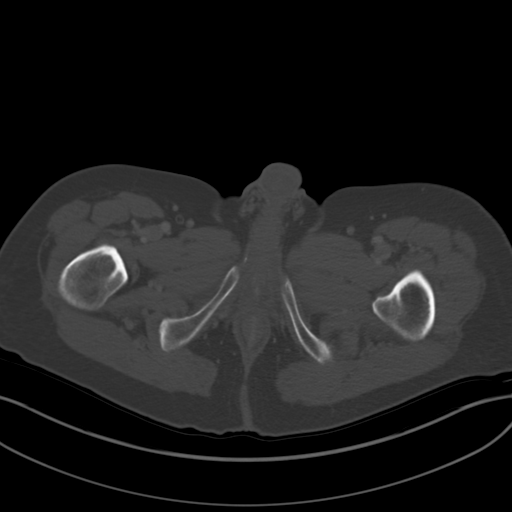
[im 16/94  soft-tissue]
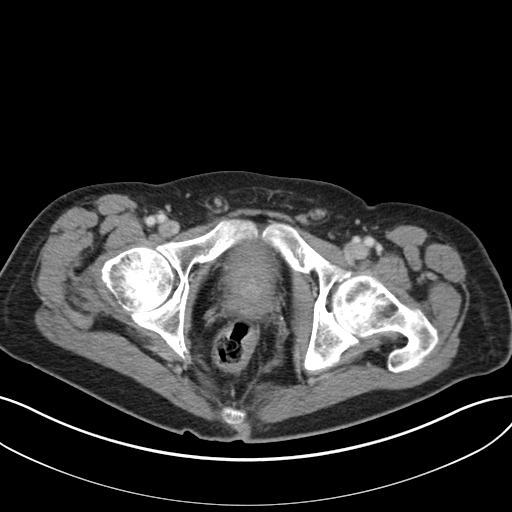
[im 21/94  soft-tissue]
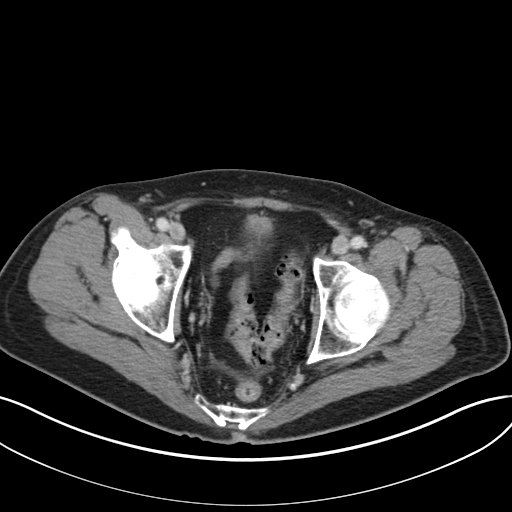
[im 32/94  soft-tissue]
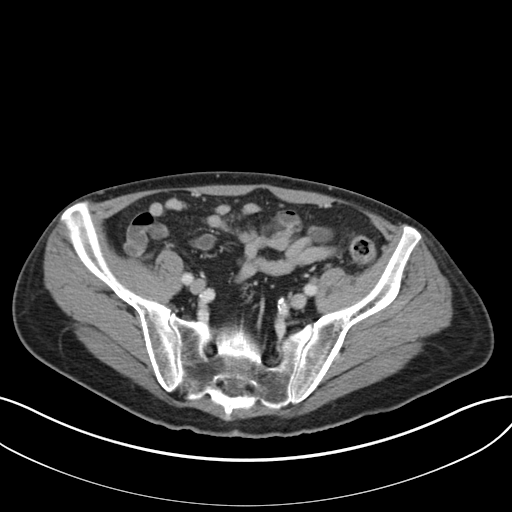
[im 37/94  soft-tissue]
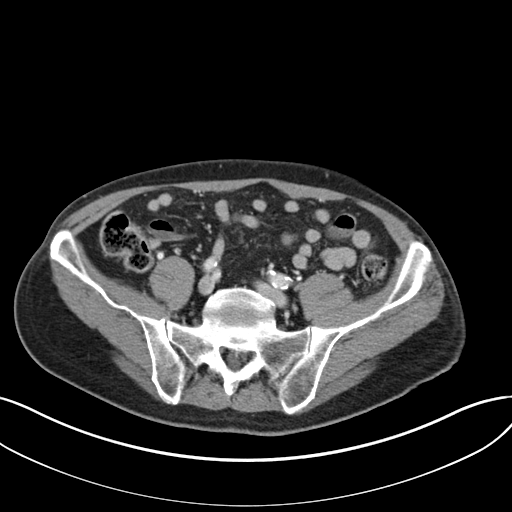
[im 47/94  soft-tissue]
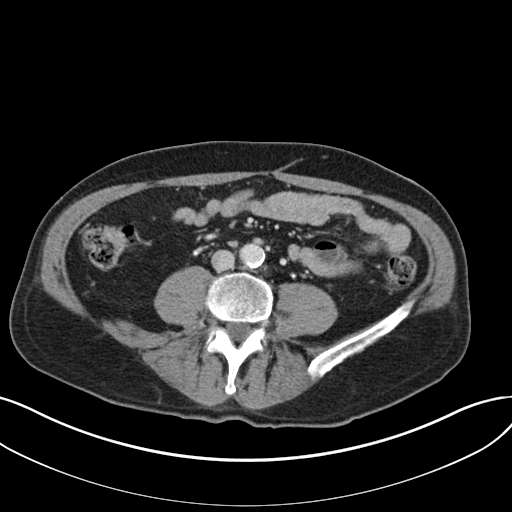
[im 57/94  soft-tissue]
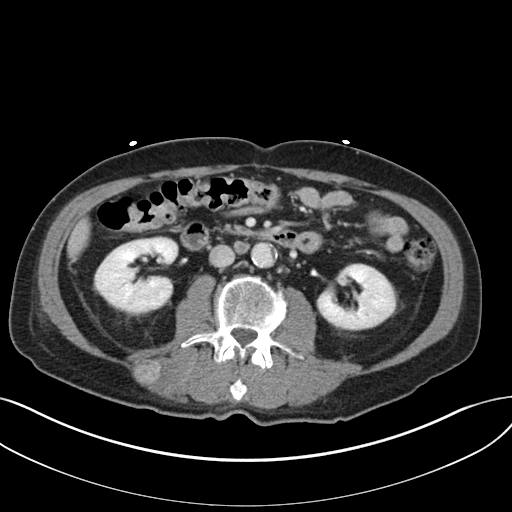
[im 63/94  soft-tissue]
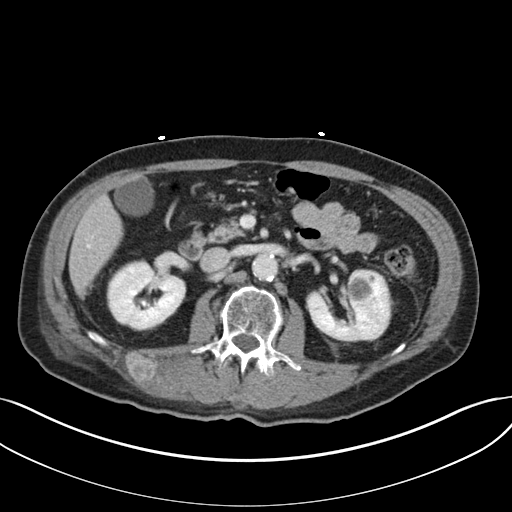
[im 73/94  soft-tissue]
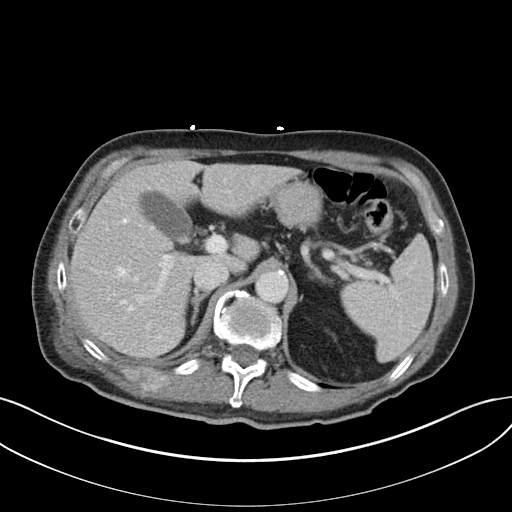
[im 73/94  bone]
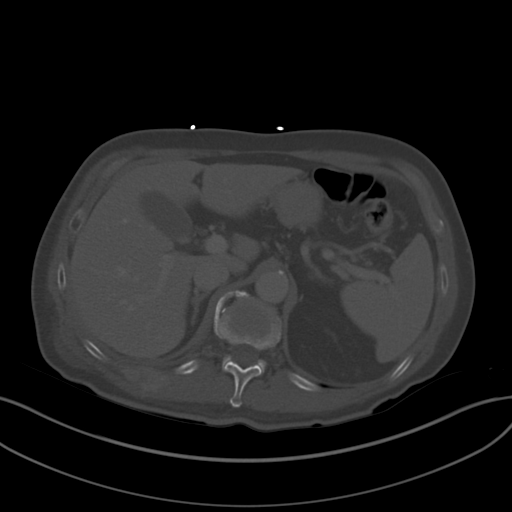
[im 78/94  soft-tissue]
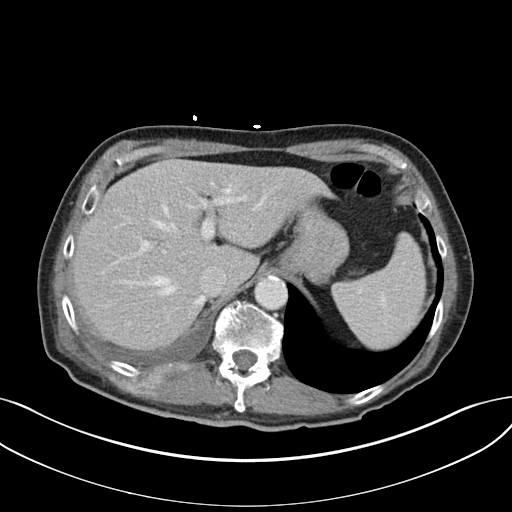
[im 88/94  soft-tissue]
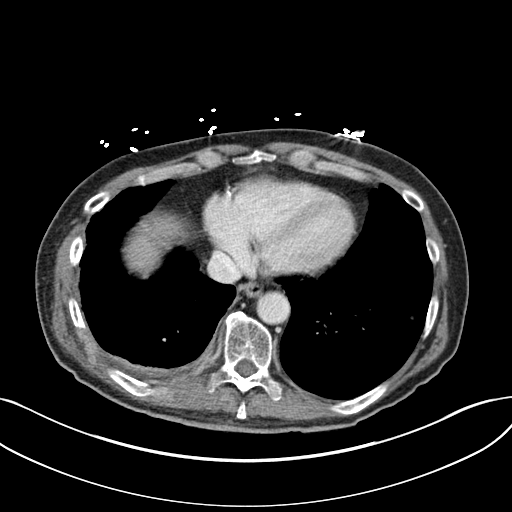

[Series 6: coronal st · coronal · 0.75mm/px · 3 of 86 slices shown]
[im 29/86  soft-tissue]
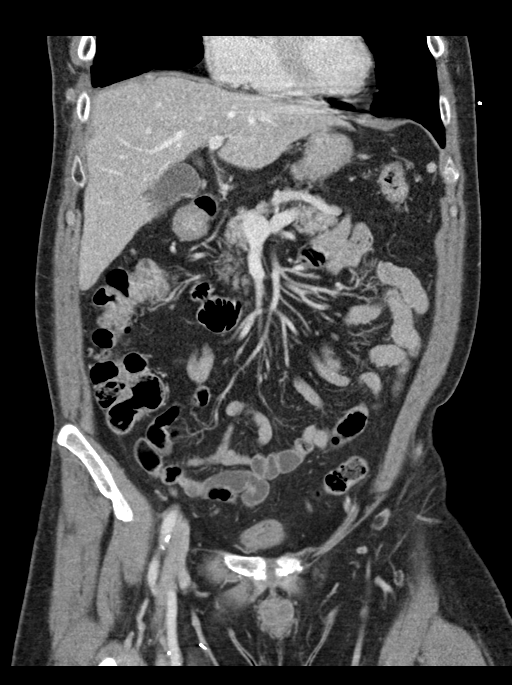
[im 38/86  soft-tissue]
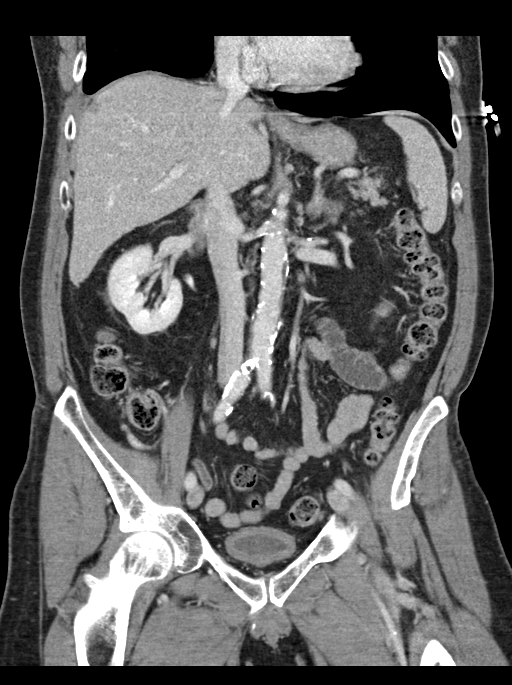
[im 48/86  soft-tissue]
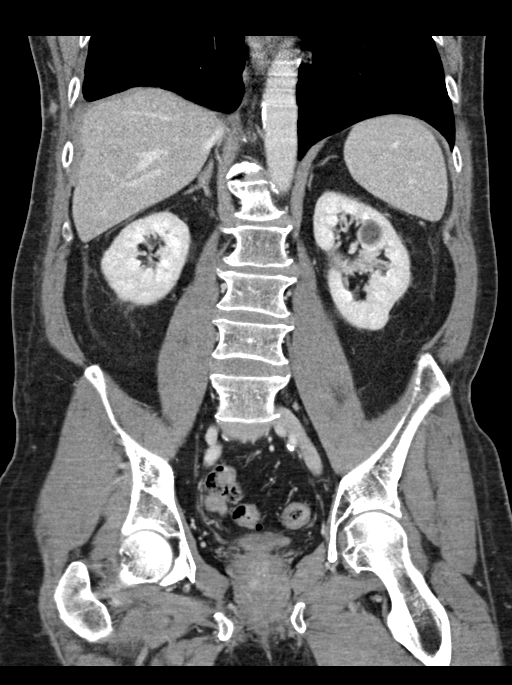

[14 of 46 positions shown; findings below may reference images not displayed]

RADIATION DOSE REDUCTION: This exam was performed according to the
departmental dose-optimization program which includes automated
exposure control, adjustment of the mA and/or kV according to
patient size and/or use of iterative reconstruction technique.

CONTRAST:  100mL OMNIPAQUE IOHEXOL 350 MG/ML SOLN
FINDINGS: CTA CHEST FINDINGS

Cardiovascular: There is no pulmonary embolism identified within the
main, lobar or central segmental pulmonary arteries bilaterally.
Some of the most peripheral pulmonary artery branches to each lung
are difficult to definitively characterize due to mild patient
breathing motion artifact.

No pericardial effusion. Aortic atherosclerosis. No evidence of
aortic dissection.

Mediastinum/Nodes: No mass or enlarged lymph nodes are identified
within the mediastinum or perihilar regions. Esophagus is
unremarkable. Trachea is unremarkable. Surgical changes at the RIGHT
hilum, compatible with a previous surgical procedure/resection.

Lungs/Pleura: Bilateral pulmonary nodules are not significantly
changed in the short-term interval, compared to most recent chest CT
of 09/15/2021. No new nodules seen.

Stable small RIGHT pleural effusion, with overlying mild
atelectasis. No pneumothorax.

Musculoskeletal: Again noted is destructive change within the
posterior eleventh rib. Again noted are multiple soft tissue masses
within the soft tissues of the lateral and posterior RIGHT chest
wall, not significantly changed in appearance compared to the
earlier study of 09/15/2021.

Review of the MIP images confirms the above findings.

CT ABDOMEN and PELVIS FINDINGS

Hepatobiliary: No acute or suspicious findings within the liver.
Tiny hypodensities again noted within the liver, too small to
definitively characterize but most likely small cysts. Gallbladder
is unremarkable. No bile duct dilatation seen.

Pancreas: Unremarkable. No pancreatic ductal dilatation or
surrounding inflammatory changes.

Spleen: Normal in size without focal abnormality.

Adrenals/Urinary Tract: Adrenal glands appear normal. LEFT renal
cyst. Kidneys otherwise unremarkable without suspicious mass, stone
or hydronephrosis. Bladder is decompressed.

Stomach/Bowel: No dilated large or small bowel loops. No evidence of
bowel wall inflammation. Appendix is normal. Stomach is
unremarkable, partially decompressed.

Vascular/Lymphatic: Aortic atherosclerosis. No acute-appearing
vascular abnormality. No enlarged lymph nodes are seen in the
abdomen or pelvis.

Reproductive: Prostate is unremarkable.

Other: No free fluid or abscess collection is seen within the
abdomen or pelvis. No free intraperitoneal air.

Musculoskeletal: No acute or suspicious osseous abnormality within
the abdomen or pelvis.

Review of the MIP images confirms the above findings.
IMPRESSION: 1. No new findings within the chest, abdomen or pelvis. No pulmonary
embolism. No evidence of pneumonia or pulmonary edema.
2. Stable small RIGHT pleural effusion, with overlying mild
atelectasis.
3. Bilateral pulmonary nodules and soft tissue masses within the
soft tissues of the RIGHT chest wall, as described above, not
significantly changed in the short-term interval compared to the
earlier study of 09/15/2021.
4. Stable destructive change within the posterior eleventh rib.
5. No acute findings within the abdomen or pelvis. No evidence of
metastatic disease within the abdomen or pelvis.

Aortic Atherosclerosis ([id number]-TW5.5).

## 2023-08-01 IMAGING — CT CT ANGIO CHEST
2 of 7 series · 17 of 46 positions shown · IV contrast (agent unspecified)
Comparison: Chest CT angiogram dated 09/15/2021. CT abdomen and
pelvis dated 09/15/2021.

CLINICAL DATA: Lung cancer patient with bone metastasis.
Generalized abdominal pain and low back pain status post
commencement of immunotherapy.



[Series 6: thins · axial · 0.76mm/px · z∈[-188,+108]mm · 14 of 328 slices shown]
[im 16/328  lung]
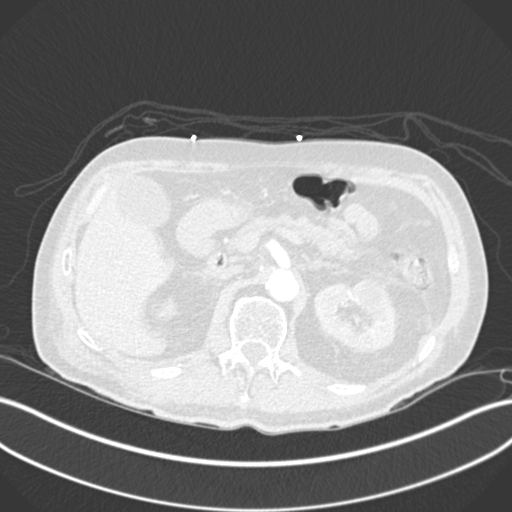
[im 47/328  soft-tissue]
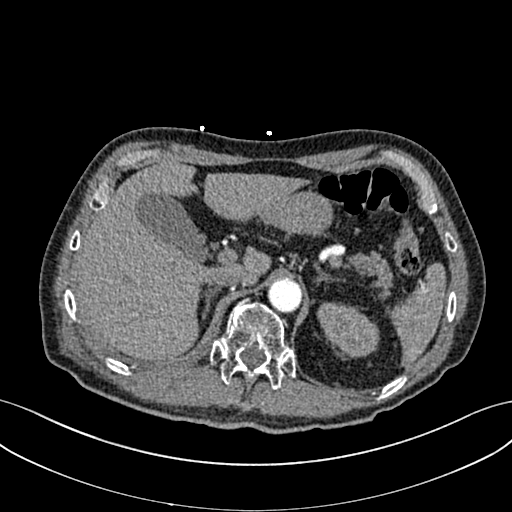
[im 63/328  lung]
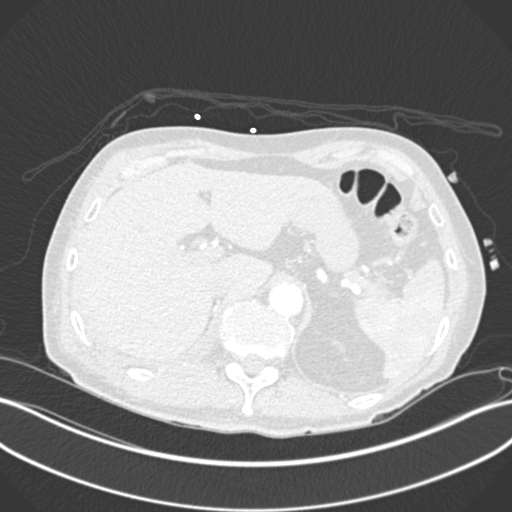
[im 94/328  soft-tissue]
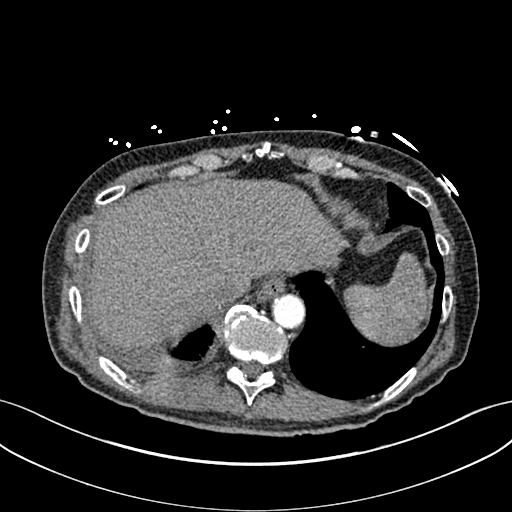
[im 110/328  lung]
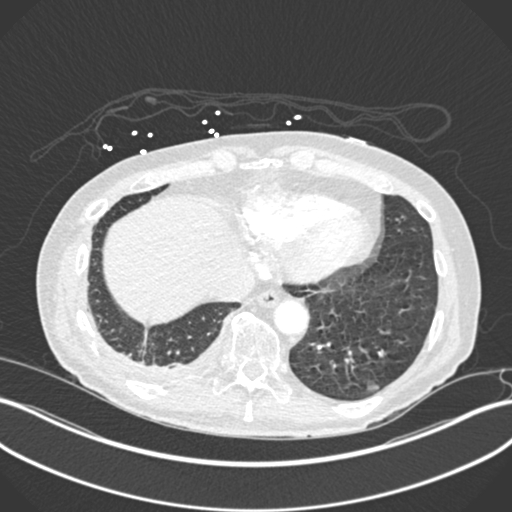
[im 125/328  soft-tissue]
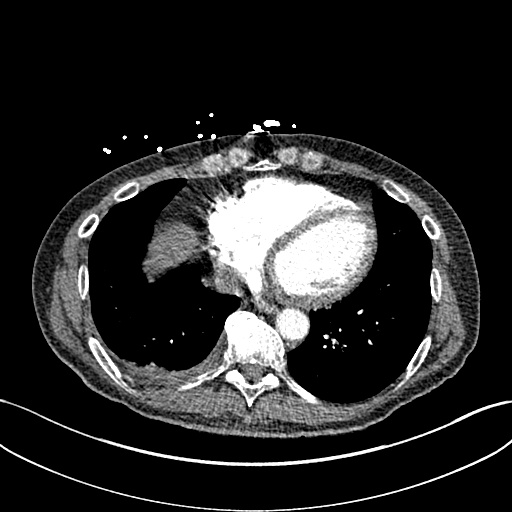
[im 156/328  lung]
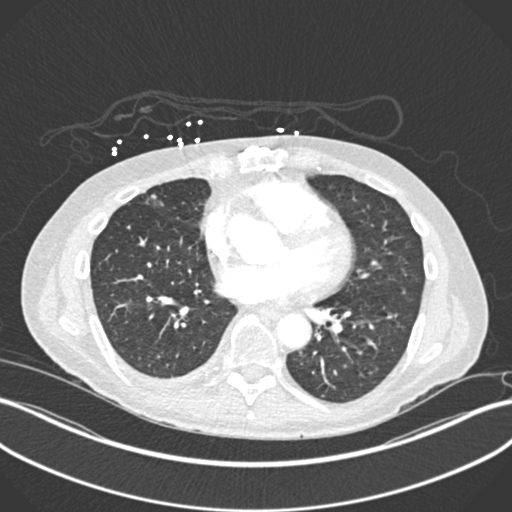
[im 172/328  soft-tissue]
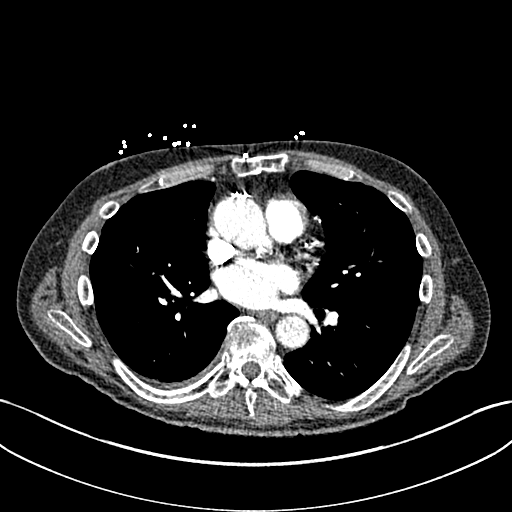
[im 203/328  lung]
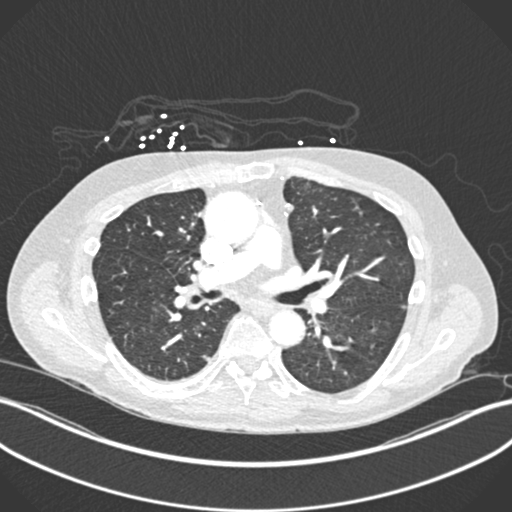
[im 219/328  soft-tissue]
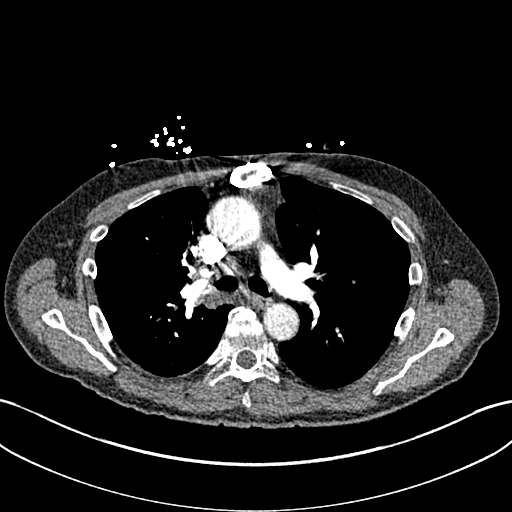
[im 250/328  lung]
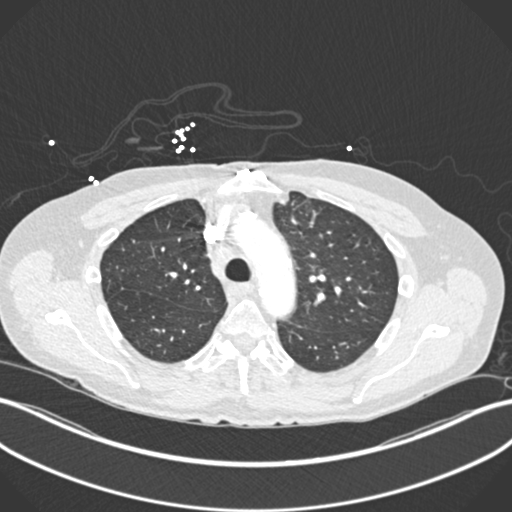
[im 265/328  soft-tissue]
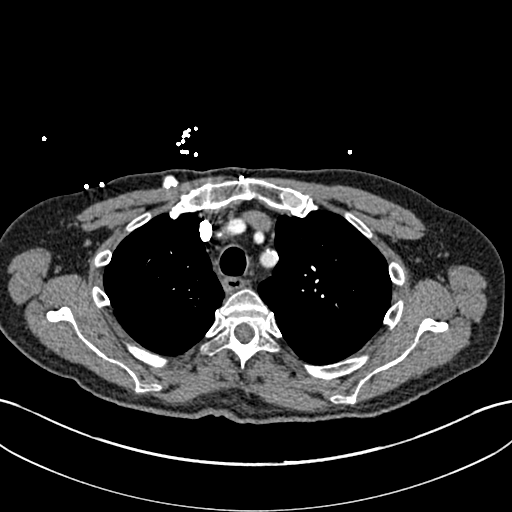
[im 281/328  lung]
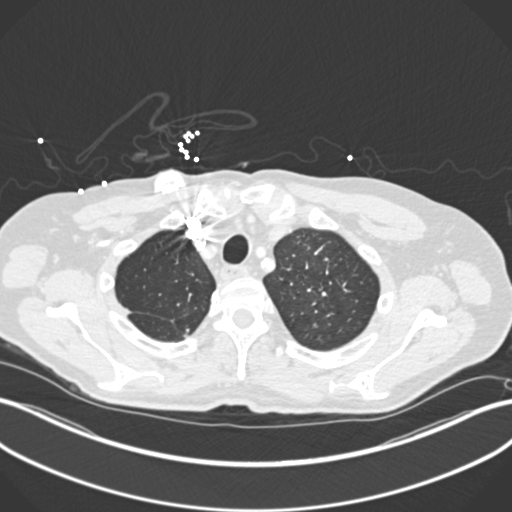
[im 312/328  soft-tissue]
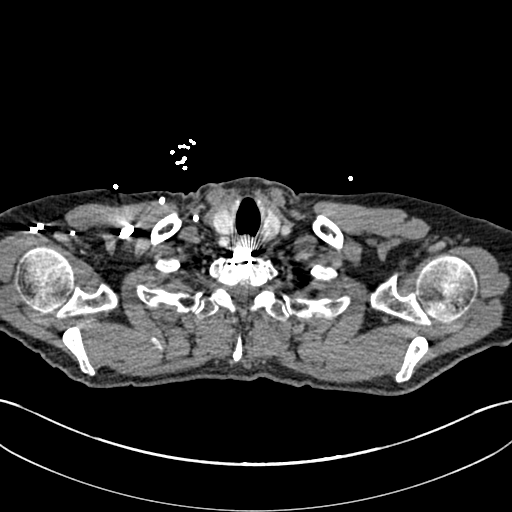

[Series 8: coronal mpr · coronal · 0.68mm/px · 3 of 128 slices shown]
[im 32/128  soft-tissue]
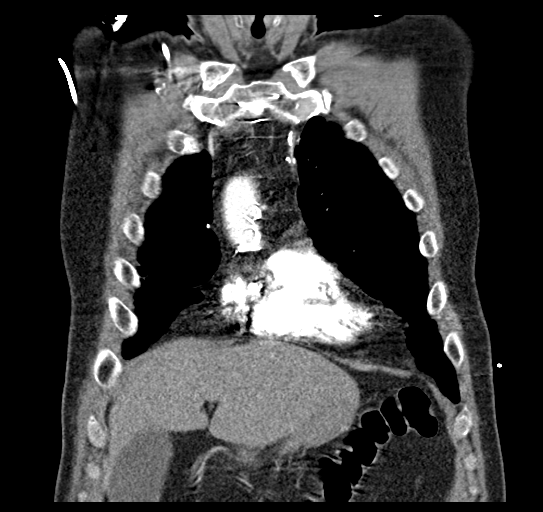
[im 64/128  soft-tissue]
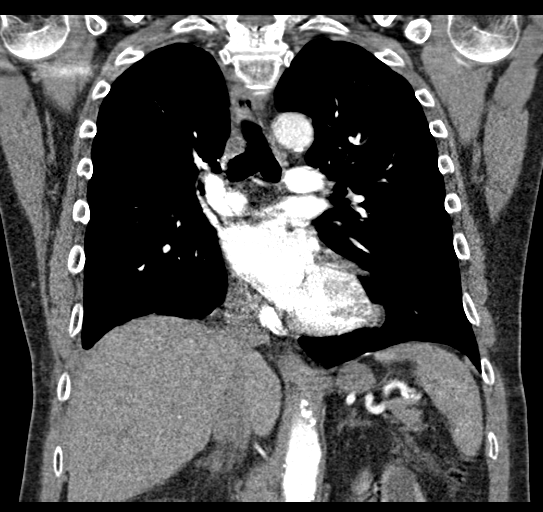
[im 96/128  soft-tissue]
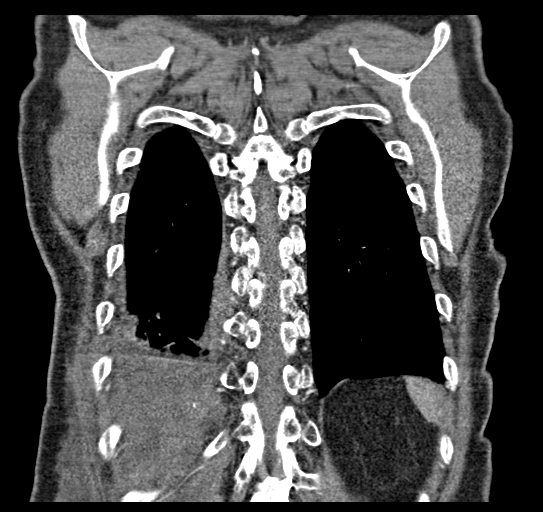

[17 of 46 positions shown; findings below may reference images not displayed]

RADIATION DOSE REDUCTION: This exam was performed according to the
departmental dose-optimization program which includes automated
exposure control, adjustment of the mA and/or kV according to
patient size and/or use of iterative reconstruction technique.

CONTRAST:  100mL OMNIPAQUE IOHEXOL 350 MG/ML SOLN
FINDINGS: CTA CHEST FINDINGS

Cardiovascular: There is no pulmonary embolism identified within the
main, lobar or central segmental pulmonary arteries bilaterally.
Some of the most peripheral pulmonary artery branches to each lung
are difficult to definitively characterize due to mild patient
breathing motion artifact.

No pericardial effusion. Aortic atherosclerosis. No evidence of
aortic dissection.

Mediastinum/Nodes: No mass or enlarged lymph nodes are identified
within the mediastinum or perihilar regions. Esophagus is
unremarkable. Trachea is unremarkable. Surgical changes at the RIGHT
hilum, compatible with a previous surgical procedure/resection.

Lungs/Pleura: Bilateral pulmonary nodules are not significantly
changed in the short-term interval, compared to most recent chest CT
of 09/15/2021. No new nodules seen.

Stable small RIGHT pleural effusion, with overlying mild
atelectasis. No pneumothorax.

Musculoskeletal: Again noted is destructive change within the
posterior eleventh rib. Again noted are multiple soft tissue masses
within the soft tissues of the lateral and posterior RIGHT chest
wall, not significantly changed in appearance compared to the
earlier study of 09/15/2021.

Review of the MIP images confirms the above findings.

CT ABDOMEN and PELVIS FINDINGS

Hepatobiliary: No acute or suspicious findings within the liver.
Tiny hypodensities again noted within the liver, too small to
definitively characterize but most likely small cysts. Gallbladder
is unremarkable. No bile duct dilatation seen.

Pancreas: Unremarkable. No pancreatic ductal dilatation or
surrounding inflammatory changes.

Spleen: Normal in size without focal abnormality.

Adrenals/Urinary Tract: Adrenal glands appear normal. LEFT renal
cyst. Kidneys otherwise unremarkable without suspicious mass, stone
or hydronephrosis. Bladder is decompressed.

Stomach/Bowel: No dilated large or small bowel loops. No evidence of
bowel wall inflammation. Appendix is normal. Stomach is
unremarkable, partially decompressed.

Vascular/Lymphatic: Aortic atherosclerosis. No acute-appearing
vascular abnormality. No enlarged lymph nodes are seen in the
abdomen or pelvis.

Reproductive: Prostate is unremarkable.

Other: No free fluid or abscess collection is seen within the
abdomen or pelvis. No free intraperitoneal air.

Musculoskeletal: No acute or suspicious osseous abnormality within
the abdomen or pelvis.

Review of the MIP images confirms the above findings.
IMPRESSION: 1. No new findings within the chest, abdomen or pelvis. No pulmonary
embolism. No evidence of pneumonia or pulmonary edema.
2. Stable small RIGHT pleural effusion, with overlying mild
atelectasis.
3. Bilateral pulmonary nodules and soft tissue masses within the
soft tissues of the RIGHT chest wall, as described above, not
significantly changed in the short-term interval compared to the
earlier study of 09/15/2021.
4. Stable destructive change within the posterior eleventh rib.
5. No acute findings within the abdomen or pelvis. No evidence of
metastatic disease within the abdomen or pelvis.

Aortic Atherosclerosis ([id number]-TW5.5).

## 2023-08-01 IMAGING — DX DG CHEST 1V PORT
2 series · 2 of 2 positions shown · non-contrast
Comparison: 09/16/2021

CLINICAL DATA: Metastatic lung cancer being treated with
immunotherapy. Generalized abdominal pain and back pain.

EXAM:
PORTABLE CHEST 1 VIEW

[chest ap (1 of 2)]
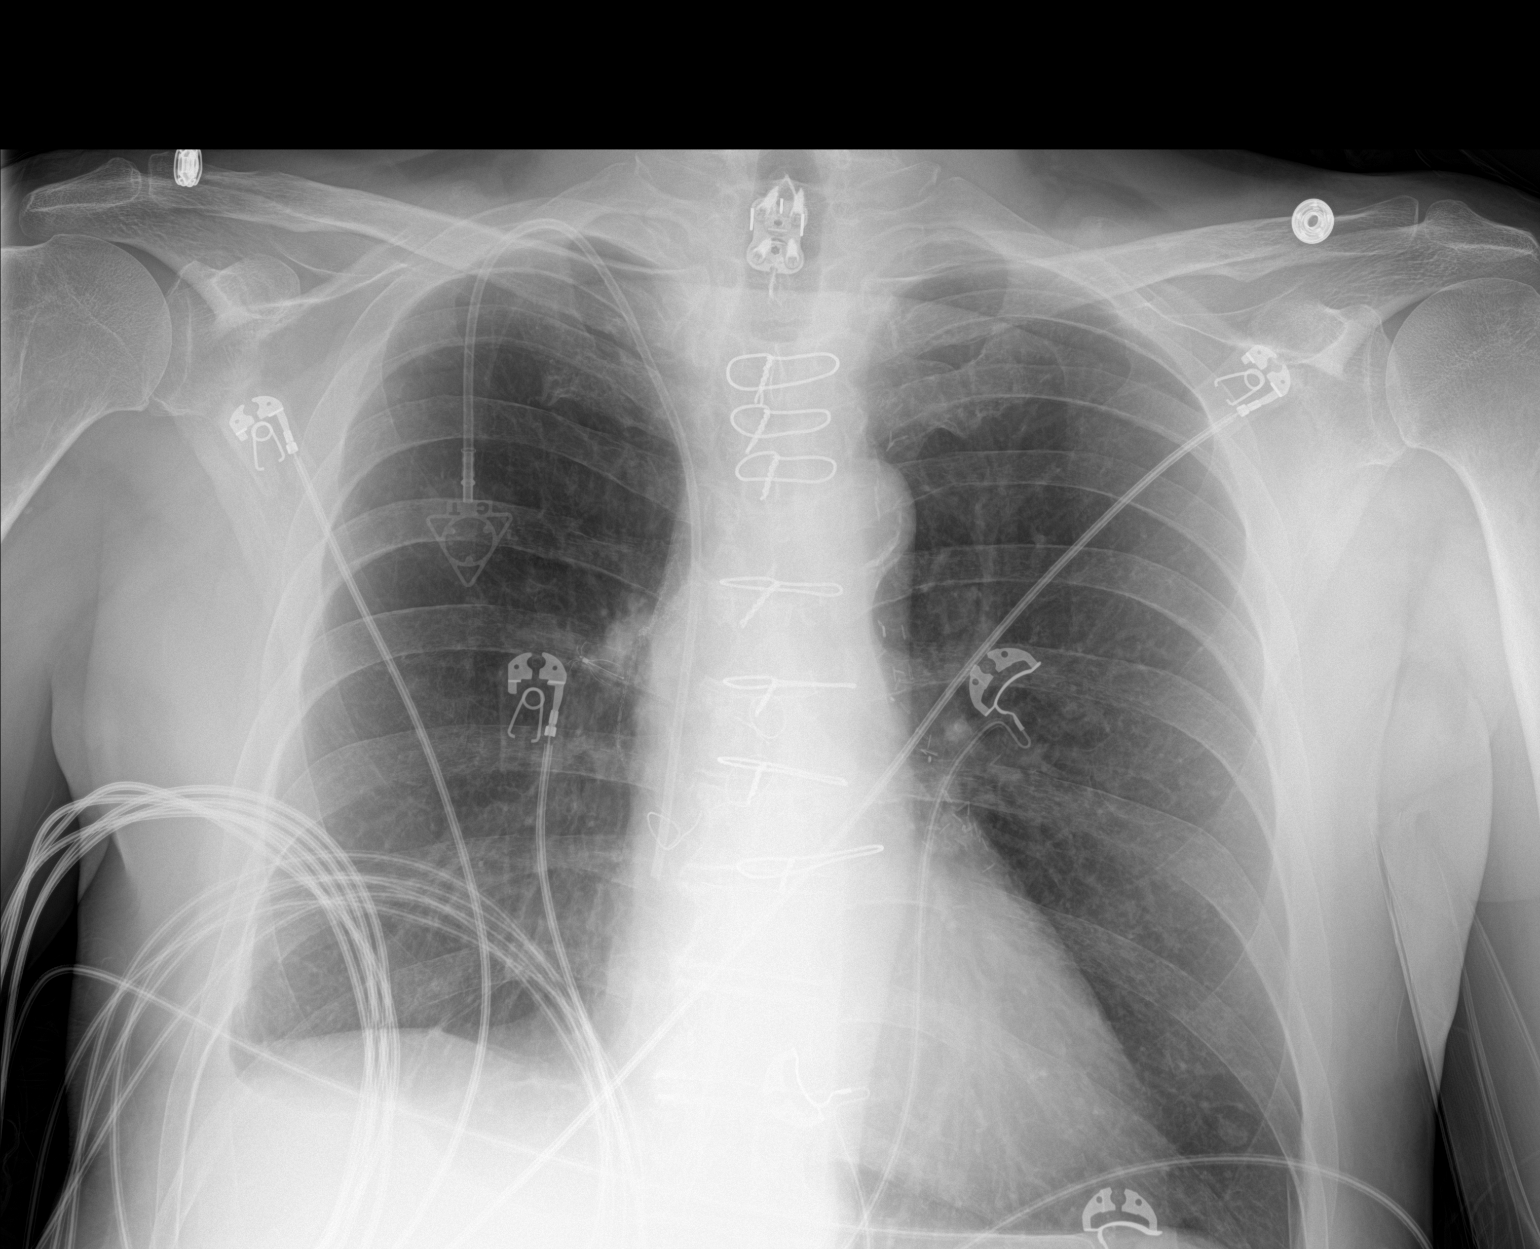

[chest ap (2 of 2)]
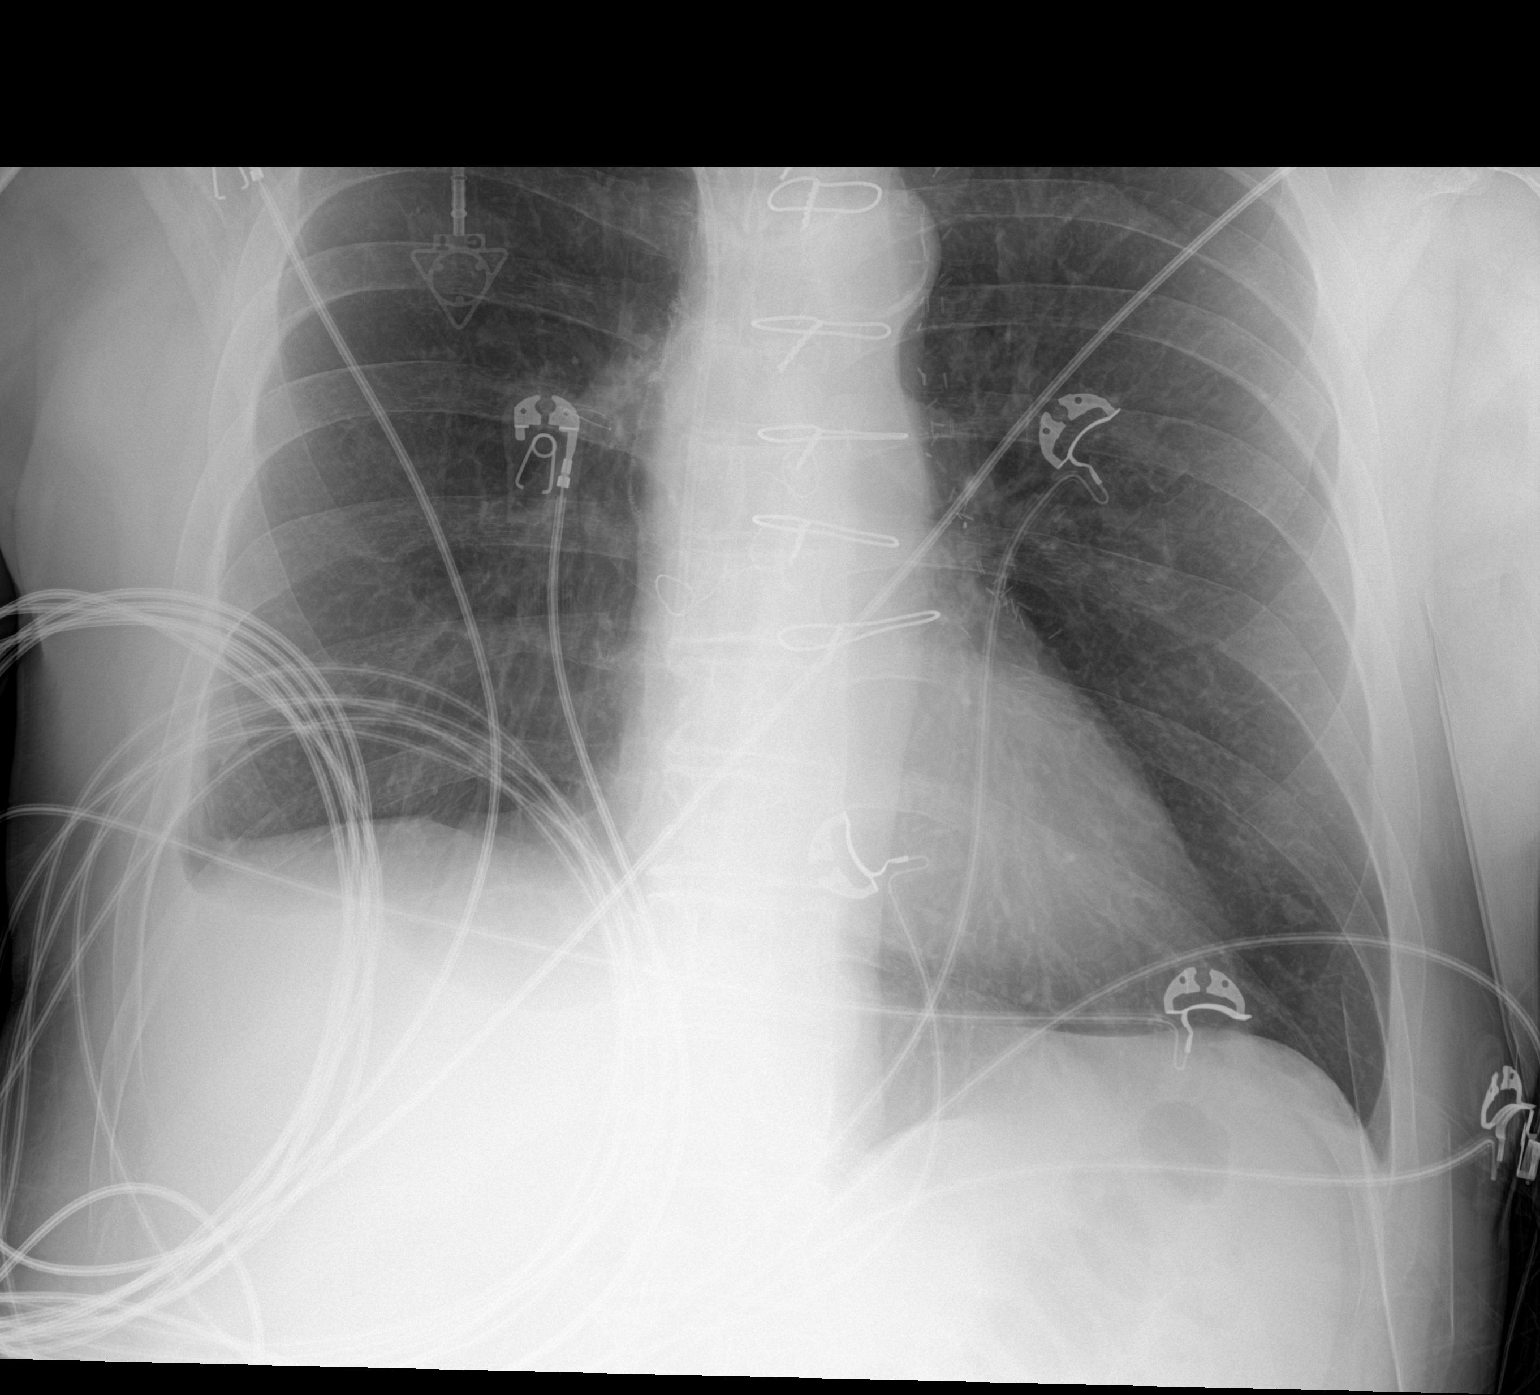

[2 of 2 positions shown; findings below may reference images not displayed]

FINDINGS: Previous median sternotomy and CABG. Power port in place on the
right with the tip at the SVC RA junction. Previous lobectomy on the
right. The lungs are presently clear. No bone lesion visible on the
frontal chest radiography. Known right eleventh rib lesion not
visible using this technique. Aortic atherosclerotic calcification
incidentally noted.
IMPRESSION: Previous lobectomy on the right.  No active disease.

## 2023-08-07 IMAGING — MR MR HEAD WO/W CM
13 series · 48 of 48 positions shown · IV contrast (gadavist)
Comparison: MRI head with contrast 08/14/2021
COMPARISON: MRI head with contrast 08/14/2021

Addendum:
CLINICAL DATA: Sudden onset severe headache. History of lung cancer
with metastatic to brain. Post radiation and chemo.

EXAM:
MRI HEAD WITHOUT AND WITH CONTRAST
TECHNIQUE: Multiplanar, multiecho pulse sequences of the brain and surrounding
structures were obtained without and with intravenous contrast.
CONTRAST:  7mL GADAVIST GADOBUTROL 1 MMOL/ML IV SOLN

[Series 5: DWI · axial · 3.0mm · 1.36mm/px · z∈[-38,+117]mm · 7 of 108 slices shown (1 of 2)]
[im 1/108]
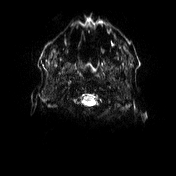
[im 18/108]
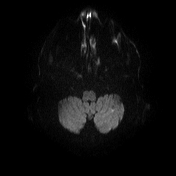
[im 36/108]
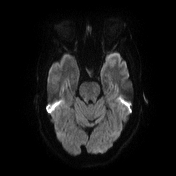
[im 54/108]
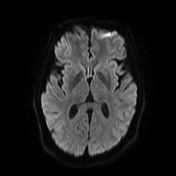
[im 72/108]
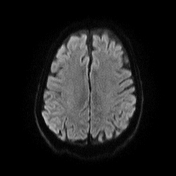
[im 90/108]
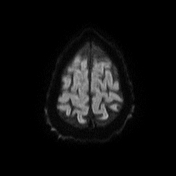
[im 108/108]
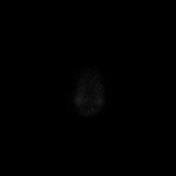

[Series 6: DWI · axial · 3.0mm · 1.36mm/px · z∈[-38,+117]mm · 3 of 54 slices shown (2 of 2)]
[im 1/54]
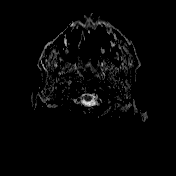
[im 27/54]
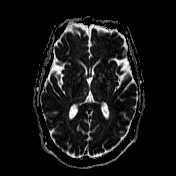
[im 54/54]
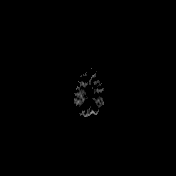

[Series 7: T1 · sagittal · 5.0mm · 0.75mm/px · 1 of 24 slices shown (1 of 2)]
[im 1/24]
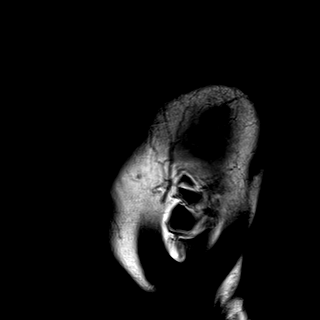

[Series 8: T2 · axial · 5.0mm · 0.62mm/px · 1 of 26 slices shown]
[im 1/26]
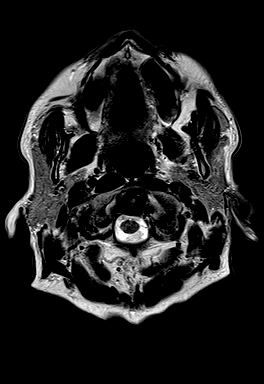

[Series 9: swi_images · axial · 3.0mm · 0.75mm/px · z∈[-45,+127]mm · 3 of 60 slices shown]
[im 1/60]
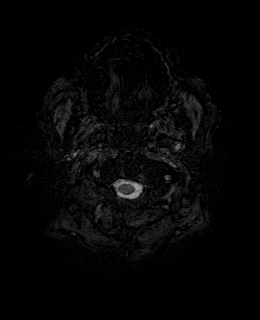
[im 30/60]
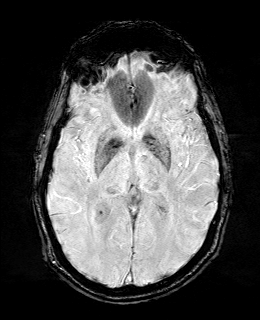
[im 60/60]
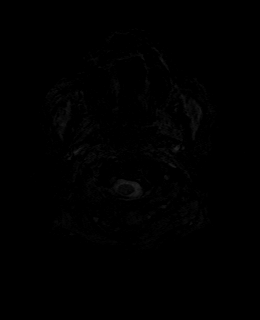

[Series 11: FLAIR · axial · 3.0mm · 0.75mm/px · z∈[-38,+120]mm · 3 of 55 slices shown]
[im 1/55]
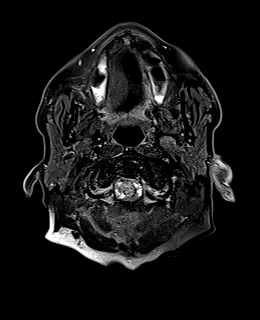
[im 28/55]
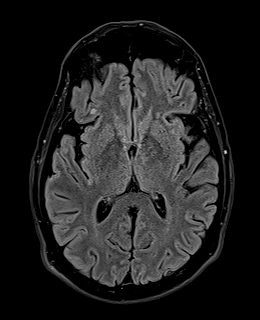
[im 55/55]
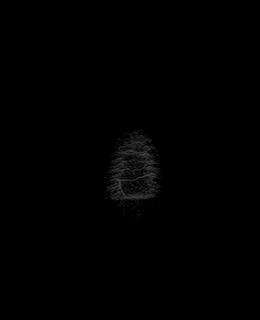

[Series 12: T1 · axial · 1.0mm · 0.94mm/px · z∈[-40,+130]mm · 10 of 176 slices shown (2 of 2)]
[im 1/176]
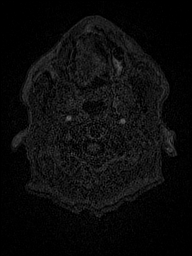
[im 20/176]
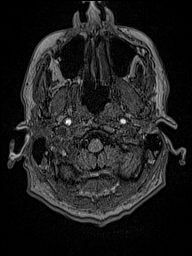
[im 39/176]
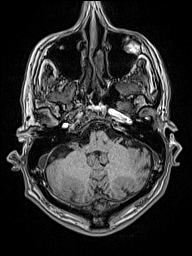
[im 59/176]
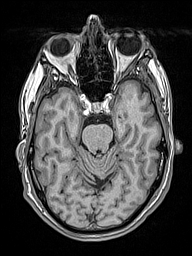
[im 78/176]
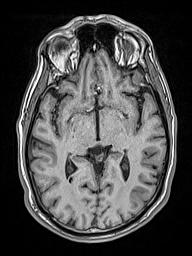
[im 98/176]
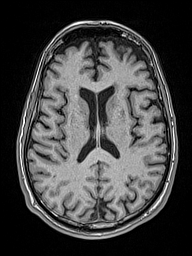
[im 117/176]
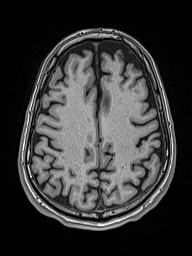
[im 137/176]
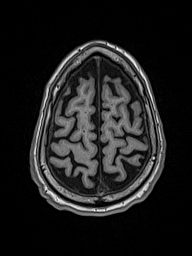
[im 156/176]
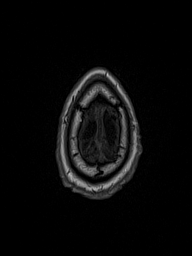
[im 176/176]
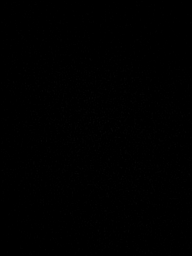

[Series 13: cor dwi_tracew · coronal · 5.0mm · 1.53mm/px · 3 of 60 slices shown]
[im 1/60]
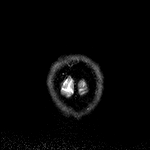
[im 30/60]
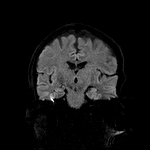
[im 60/60]
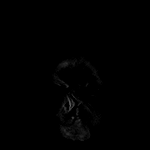

[Series 14: cor dwi_adc · coronal · 5.0mm · 1.53mm/px · 2 of 30 slices shown]
[im 1/30]
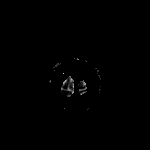
[im 30/30]
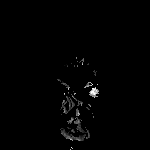

[Series 15: T2 post-contrast · coronal · 5.0mm · 0.57mm/px · 2 of 32 slices shown]
[im 1/32]
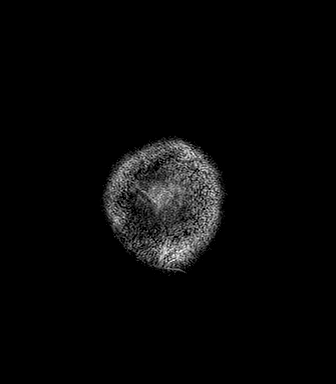
[im 32/32]
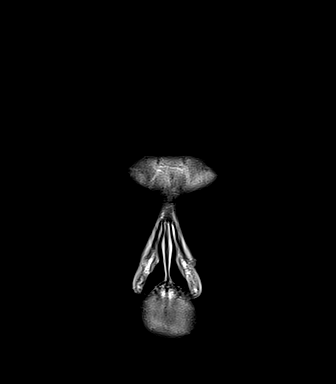

[Series 16: T1 post-contrast · axial · 1.0mm · 0.94mm/px · z∈[-40,+130]mm · 10 of 176 slices shown (1 of 3)]
[im 1/176]
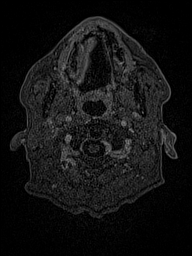
[im 20/176]
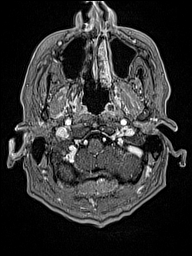
[im 39/176]
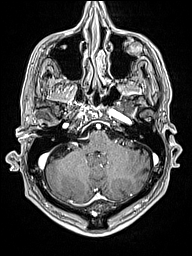
[im 59/176]
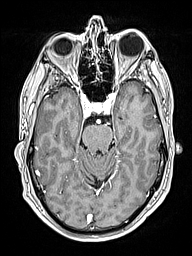
[im 78/176]
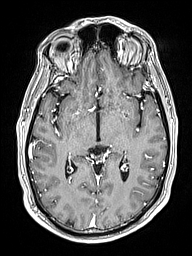
[im 98/176]
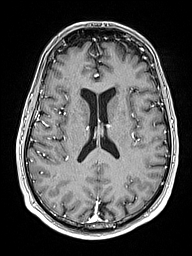
[im 117/176]
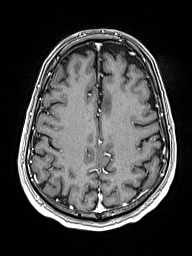
[im 137/176]
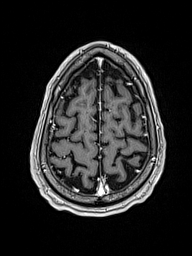
[im 156/176]
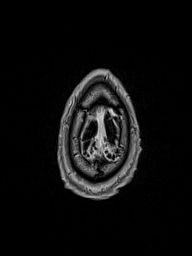
[im 176/176]
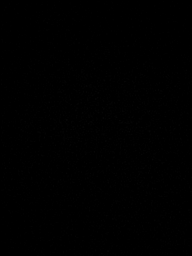

[Series 17: T1 post-contrast · coronal · 5.0mm · 0.43mm/px · 2 of 32 slices shown (2 of 3)]
[im 1/32]
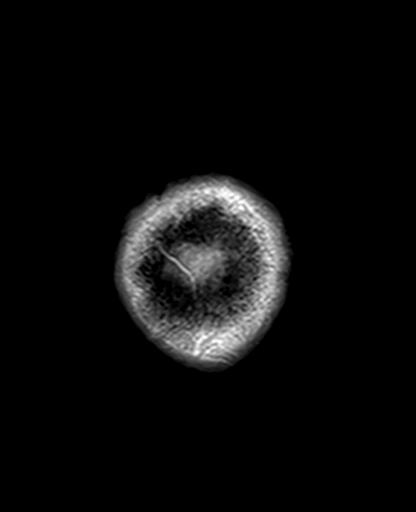
[im 32/32]
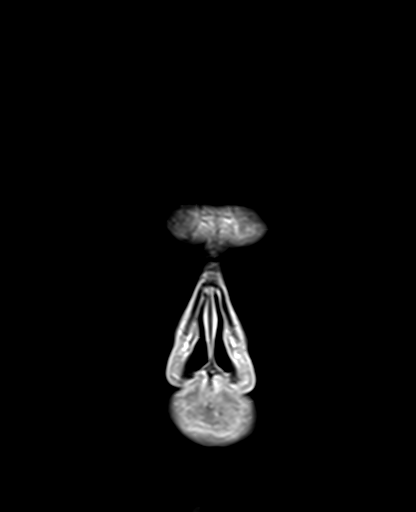

[Series 18: T1 post-contrast · sagittal · 5.0mm · 0.75mm/px · 1 of 24 slices shown (3 of 3)]
[im 1/24]
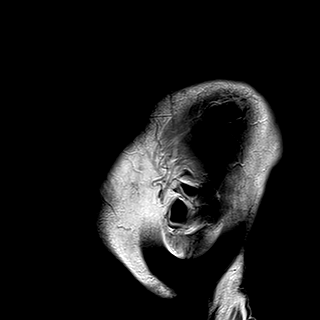

[48 of 48 positions shown; findings below may reference images not displayed]

FINDINGS: Brain: Multiple small metastatic deposits in the brain. Mixed
response to treatment with several lesions larger and most lesions
smaller.

Larger lesions:

Left cerebellar 4 mm lesion axial image 33 appears larger with new
area of surrounding edema. Associated chronic hemorrhage has
progressed. This is a treated lesion and enlargement could be
related to tumor growth or treatment effect.

Enhancing lesion in the left globus pallidus region measures 5 mm
and is increased in size. Associated chronic hemorrhage is
unchanged. No edema.

Stable or smaller lesions:

3 mm lesion right posterior insula axial image 86 slightly smaller

2 mm lesion left parietal cortex axial image 128 smaller

3 mm lesion left frontal cortex axial image 129 is smaller

3 mm lesion right frontal cortex over the convexity is slightly
smaller. Axial image 143

Ventricle size normal.  Negative for acute infarct.

Vascular: Normal arterial flow voids

Skull and upper cervical spine: No suspicious skeletal lesion.

Sinuses/Orbits: Paranasal sinuses clear.  Negative orbit

Other: None
IMPRESSION: Multiple small metastatic deposits to the brain. Mixed response with
2 lesions mildly larger and the remaining lesions stable or smaller.

ADDENDUM:
The current MRI brain was discussed at Brain tumor Conference.
Further detail was requested regarding the lesion in the left globus
pallidus. This lesion enhances and currently measures 5 mm in
diameter. This lesion shows interval growth since the MRI of
08/14/2021 when the lesion measured approximately 2 mm in diameter.
Lesion is not seen in Friday April, 2021 MRI. Chronic hemorrhage is
seen in the lesion on the current study and in [REDACTED] but not
present on May 15, 2021 MRI.

*** End of Addendum ***
FINDINGS: Brain: Multiple small metastatic deposits in the brain. Mixed
response to treatment with several lesions larger and most lesions
smaller.

Larger lesions:

Left cerebellar 4 mm lesion axial image 33 appears larger with new
area of surrounding edema. Associated chronic hemorrhage has
progressed. This is a treated lesion and enlargement could be
related to tumor growth or treatment effect.

Enhancing lesion in the left globus pallidus region measures 5 mm
and is increased in size. Associated chronic hemorrhage is
unchanged. No edema.

Stable or smaller lesions:

3 mm lesion right posterior insula axial image 86 slightly smaller

2 mm lesion left parietal cortex axial image 128 smaller

3 mm lesion left frontal cortex axial image 129 is smaller

3 mm lesion right frontal cortex over the convexity is slightly
smaller. Axial image 143

Ventricle size normal.  Negative for acute infarct.

Vascular: Normal arterial flow voids

Skull and upper cervical spine: No suspicious skeletal lesion.

Sinuses/Orbits: Paranasal sinuses clear.  Negative orbit

Other: None
IMPRESSION: Multiple small metastatic deposits to the brain. Mixed response with
2 lesions mildly larger and the remaining lesions stable or smaller.

## 2023-08-15 IMAGING — MR MR HEAD WO/W CM
12 series · 48 of 48 positions shown · IV contrast (multihance)
Comparison: 10/08/2021

CLINICAL DATA: Brain metastases.  Assess treatment response.

EXAM:
MRI HEAD WITHOUT AND WITH CONTRAST
TECHNIQUE: Multiplanar, multiecho pulse sequences of the brain and surrounding
structures were obtained without and with intravenous contrast.
CONTRAST:  15mL MULTIHANCE GADOBENATE DIMEGLUMINE 529 MG/ML IV SOLN

[Series 2: FLAIR · sagittal · 3.0mm · 0.75mm/px · 3 of 39 slices shown (1 of 2)]
[im 1/39]
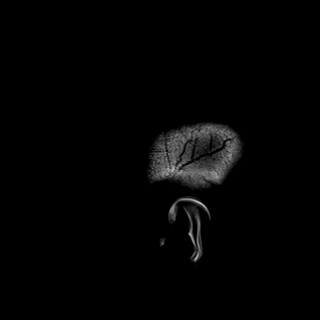
[im 20/39]
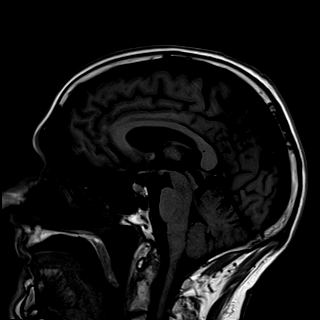
[im 39/39]
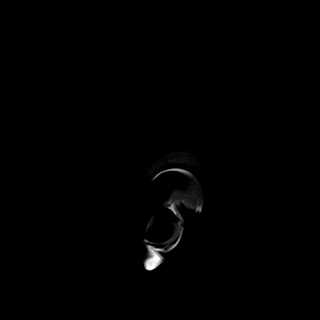

[Series 3: DWI · axial · 3.0mm · 1.50mm/px · z∈[-84,+83]mm · 4 of 88 slices shown (1 of 2)]
[im 1/88]
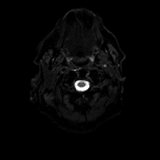
[im 30/88]
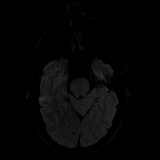
[im 59/88]
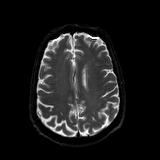
[im 88/88]
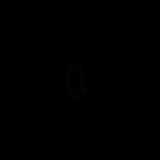

[Series 4: DWI · axial · 3.0mm · 1.50mm/px · z∈[-84,+83]mm · 2 of 44 slices shown (2 of 2)]
[im 1/44]
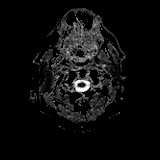
[im 44/44]
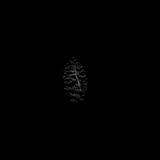

[Series 5: T2 · axial · 5.0mm · 0.57mm/px · 1 of 30 slices shown (1 of 2)]
[im 1/30]
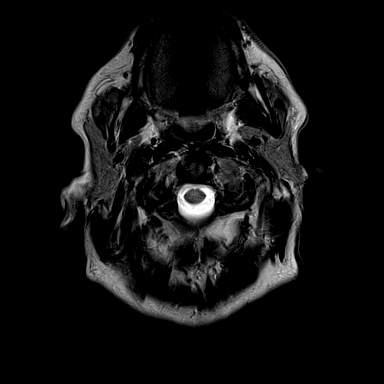

[Series 6: swi_images · axial · 1.5mm · 0.90mm/px · z∈[-87,+91]mm · 6 of 120 slices shown]
[im 1/120]
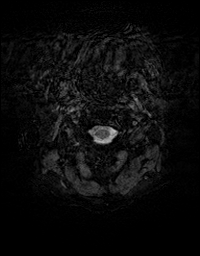
[im 24/120]
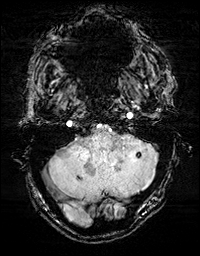
[im 48/120]
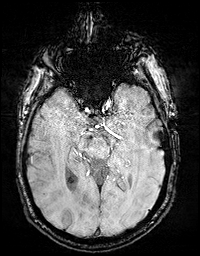
[im 72/120]
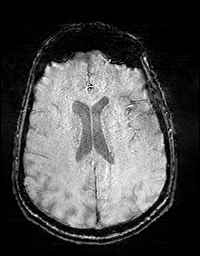
[im 96/120]
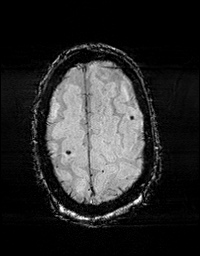
[im 120/120]
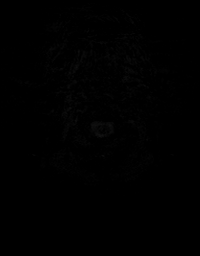

[Series 8: FLAIR · axial · 3.0mm · 0.86mm/px · z∈[-99,+93]mm · 3 of 64 slices shown (2 of 2)]
[im 1/64]
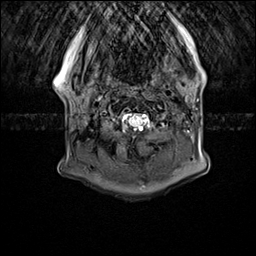
[im 32/64]
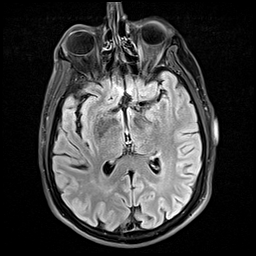
[im 64/64]
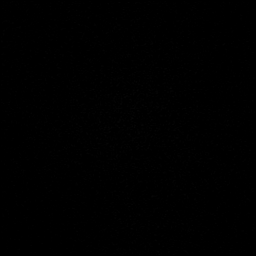

[Series 9: T2 · axial · non-contrast · 1.0mm · 0.86mm/px · z∈[-85,+87]mm · 8 of 176 slices shown (2 of 2)]
[im 1/176]
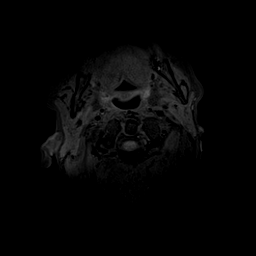
[im 26/176]
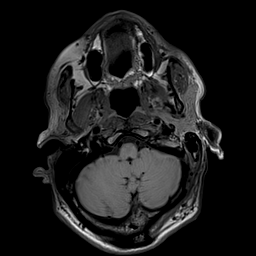
[im 51/176]
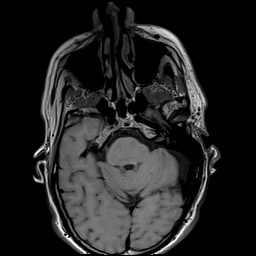
[im 76/176]
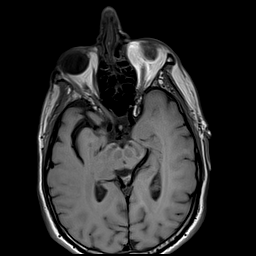
[im 101/176]
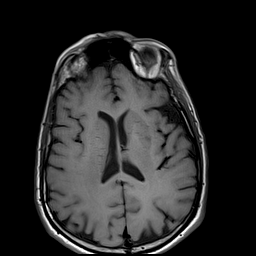
[im 126/176]
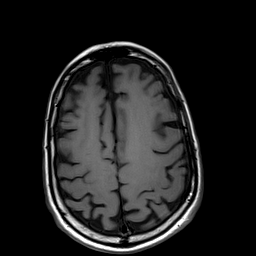
[im 151/176]
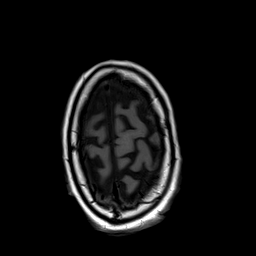
[im 176/176]
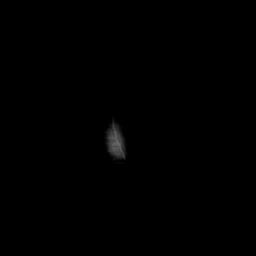

[Series 10: T2 post-contrast · coronal · 3.0mm · 0.57mm/px · 2 of 49 slices shown (1 of 2)]
[im 1/49]
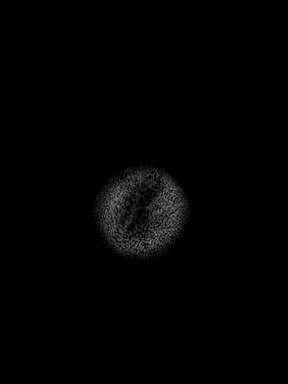
[im 49/49]
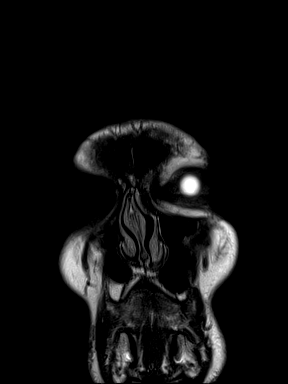

[Series 11: T2 post-contrast · axial · 1.0mm · 0.86mm/px · z∈[-85,+87]mm · 8 of 176 slices shown (2 of 2)]
[im 1/176]
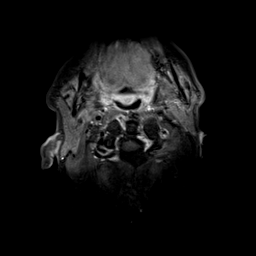
[im 26/176]
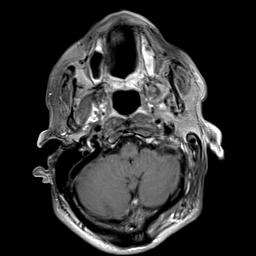
[im 51/176]
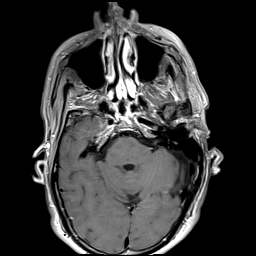
[im 76/176]
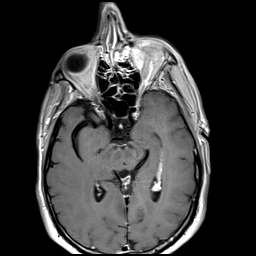
[im 101/176]
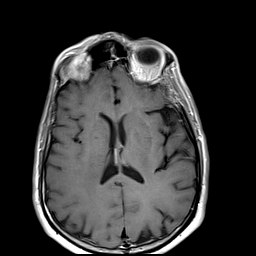
[im 126/176]
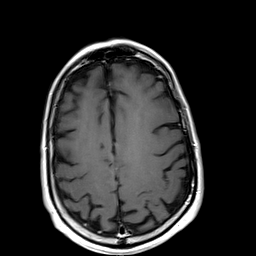
[im 151/176]
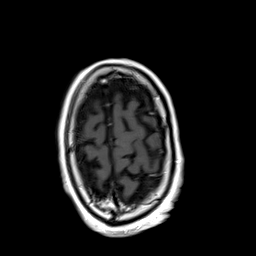
[im 176/176]
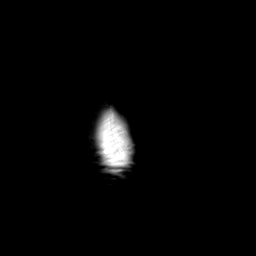

[Series 12: T1 post-contrast · axial · 1.0mm · 0.75mm/px · z∈[-78,+81]mm · 7 of 160 slices shown (1 of 2)]
[im 1/160]
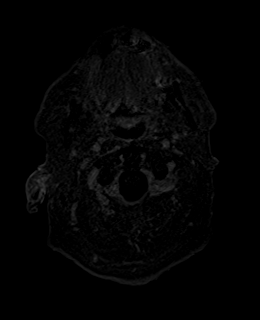
[im 27/160]
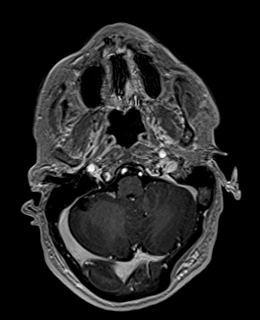
[im 54/160]
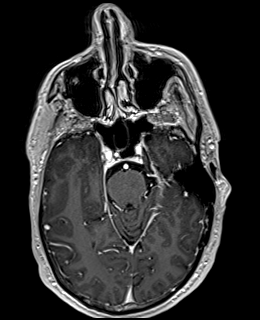
[im 80/160]
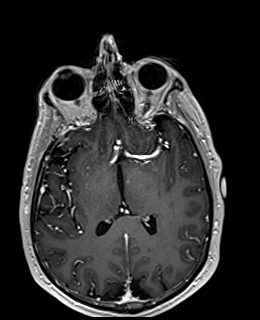
[im 107/160]
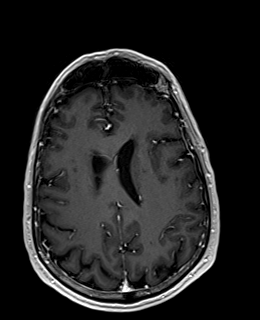
[im 133/160]
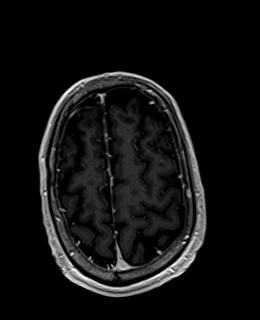
[im 160/160]
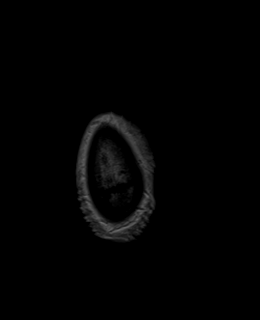

[Series 13: T1 post-contrast · coronal · 3.0mm · 0.57mm/px · 2 of 49 slices shown (2 of 2)]
[im 1/49]
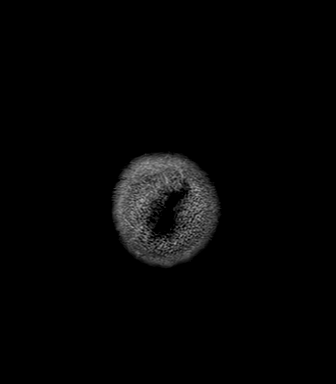
[im 49/49]
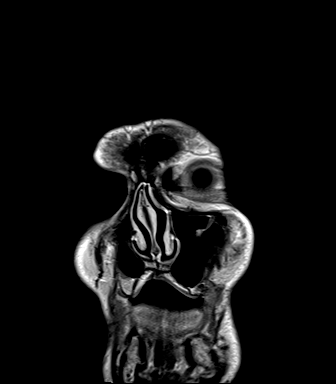

[Series 14: FLAIR post-contrast · sagittal · 3.0mm · 0.75mm/px · 2 of 39 slices shown]
[im 1/39]
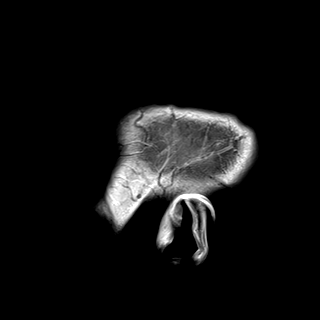
[im 39/39]
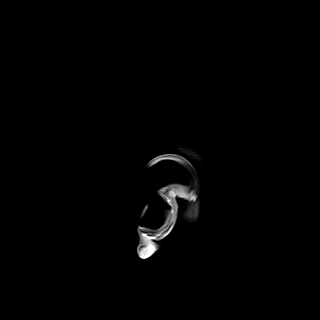

[48 of 48 positions shown; findings below may reference images not displayed]

FINDINGS: Brain: Diffusion imaging does not show any acute or subacute
infarction or any cause restricted diffusion. No hydrocephalus or
extra-axial collection. No acute intracranial hemorrhage.

Slight enlargement of a ring-enhancing lesion in the left
cerebellum, measuring 5 mm today compared with 3.5 mm 8 days ago.
Small focus of hemosiderin deposition in the right cerebellum is
present without abnormal enhancement. No second enhancing cerebellar
lesion.

Stable punctate focus of enhancement at the deep insula on the right
axial image 75.

Slight enlargement of an enhancing lesion of the left basal
ganglia/genu of the internal capsule, measuring 7.6 mm today
compared with 5.7 mm previously.

Stable punctate focus of enhancement along a left posterior parietal
gyrus, axial image 122.

Stable 3 mm focus of enhancement along a right posterior frontal
gyrus axial image 137.

Stable 2.5 mm focus of enhancement along a left posterior frontal
gyrus, axial image 139.

No entirely new lesion is identified.

Vascular: Major vessels at the base of the brain show flow.

Skull and upper cervical spine: Negative

Sinuses/Orbits: Clear/normal

Other: None
IMPRESSION: Slight enlargement of the ring-enhancing lesion in the left
cerebellum, measuring 5 mm today compared with 3.5 mm 8 days ago.

Slight enlargement of an enhancing lesion of the left basal
ganglia/genu of the internal capsule, measuring 7.6 mm today
compared with 5.7 mm previously.

Four other stable punctate lesions, right deep insula axial image
75, left posterior parietal gyrus axial image 122, right posterior
frontal gyrus axial image 137, left posterior frontal gyrus axial
image 139.

No entirely new lesion.

## 2023-10-16 IMAGING — CT CT ABD-PELV W/ CM
2 of 5 series · 14 of 46 positions shown, 16 images · IV contrast (agent unspecified)
Comparison: 12/04/2021

CLINICAL DATA: Bilateral lower extremity edema, abdominal pain,
nausea and vomiting for 2 weeks, metastatic lung cancer



[Series 4: axial st · axial · 0.76mm/px · z∈[+1098,+1518]mm · 11 of 100 slices shown, 13 images]
[im 8/100  soft-tissue]
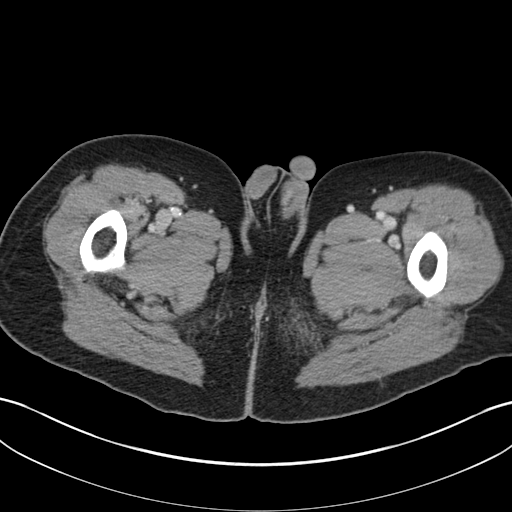
[im 8/100  bone]
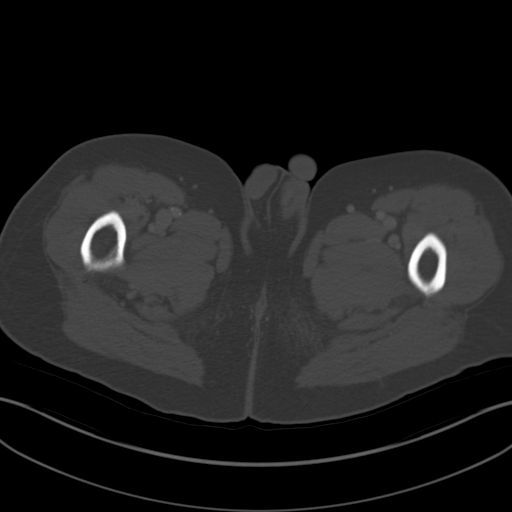
[im 15/100  soft-tissue]
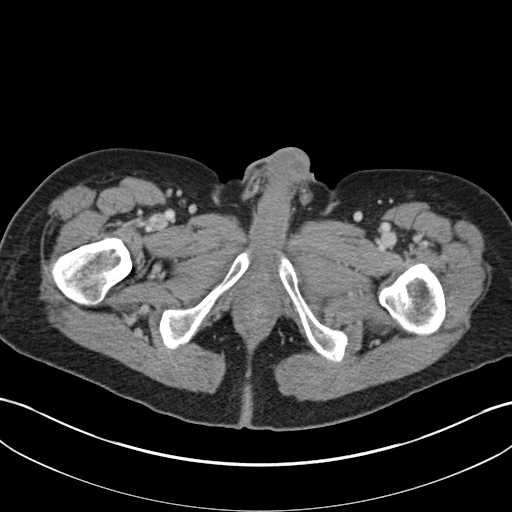
[im 22/100  soft-tissue]
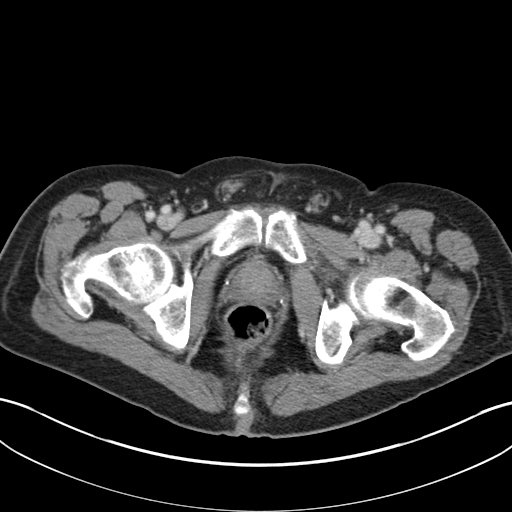
[im 36/100  soft-tissue]
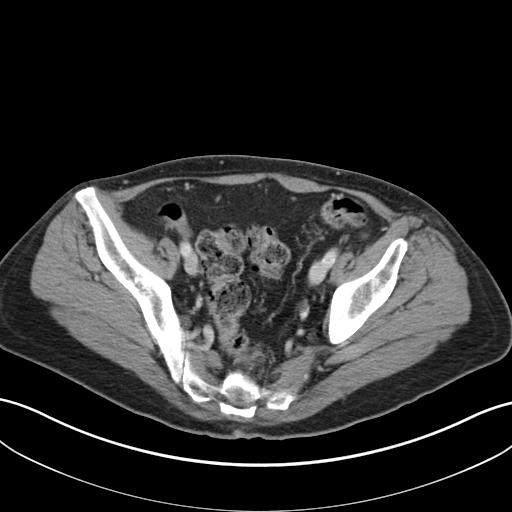
[im 43/100  soft-tissue]
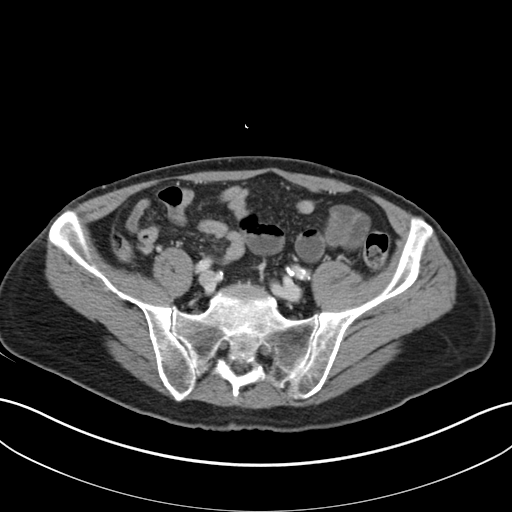
[im 50/100  soft-tissue]
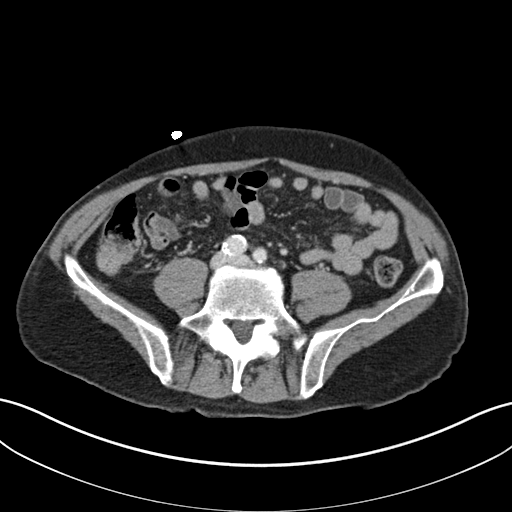
[im 57/100  soft-tissue]
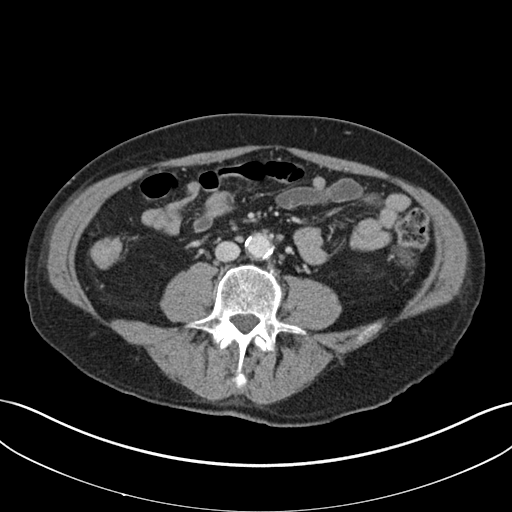
[im 64/100  soft-tissue]
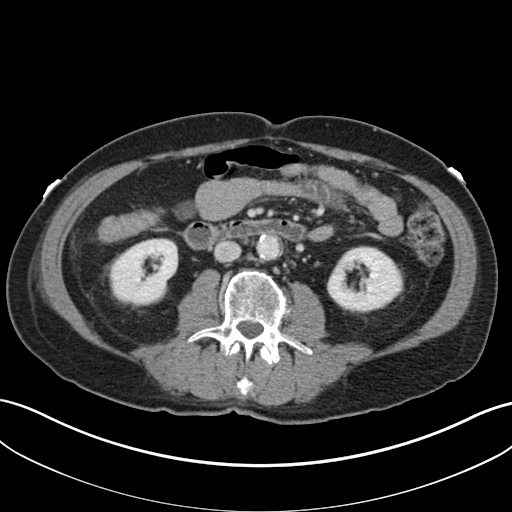
[im 78/100  soft-tissue]
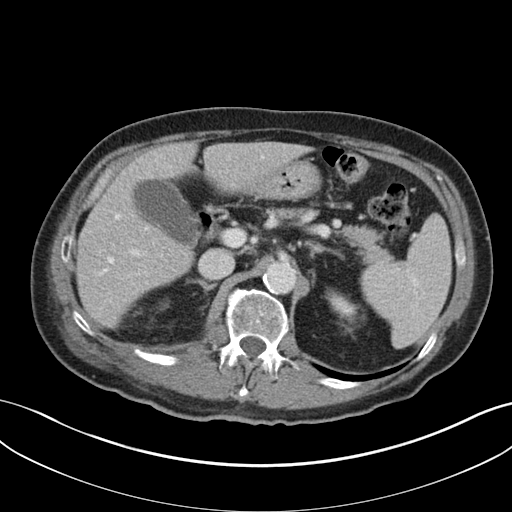
[im 78/100  bone]
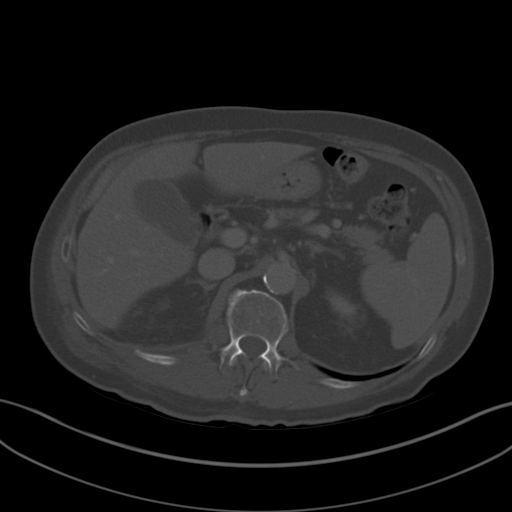
[im 85/100  soft-tissue]
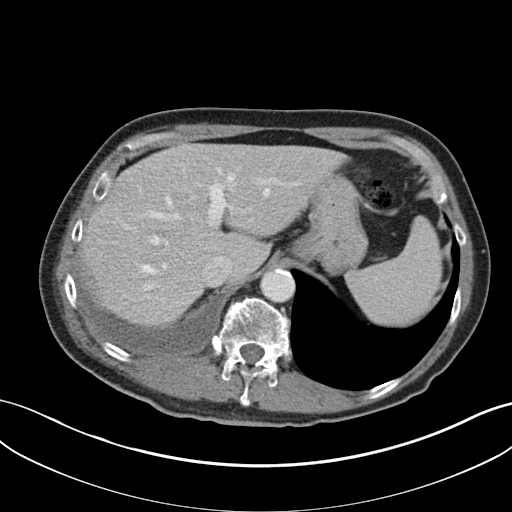
[im 92/100  soft-tissue]
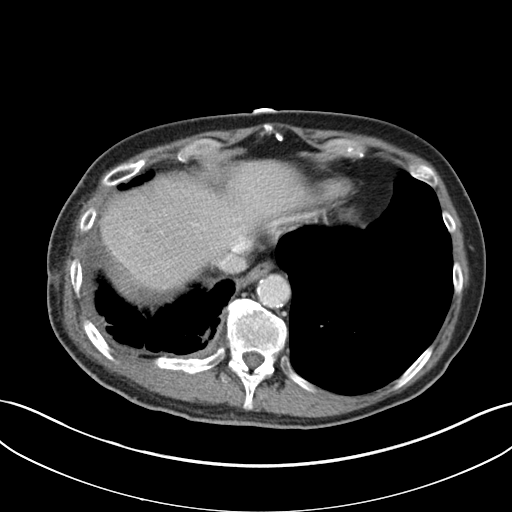

[Series 6: coronal st · coronal · 0.83mm/px · 3 of 79 slices shown]
[im 27/79  soft-tissue]
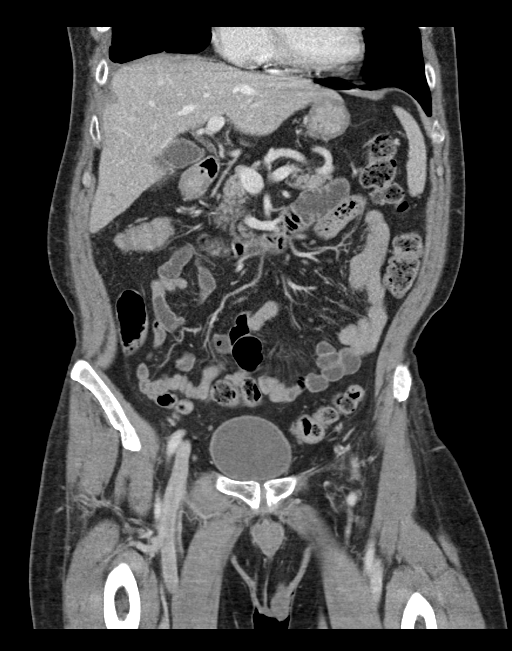
[im 35/79  soft-tissue]
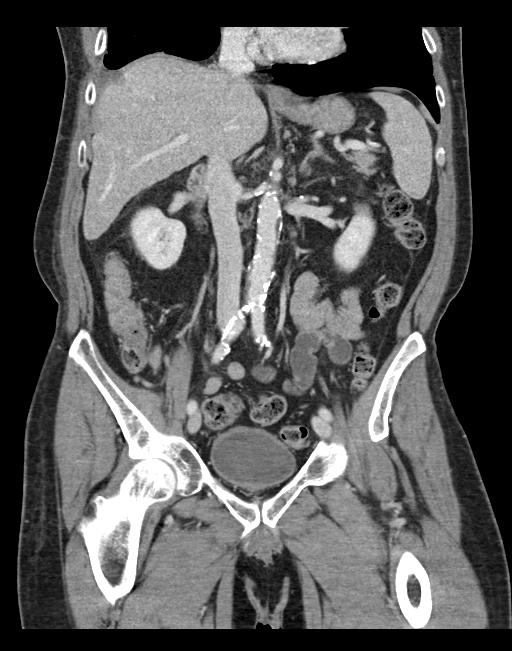
[im 44/79  soft-tissue]
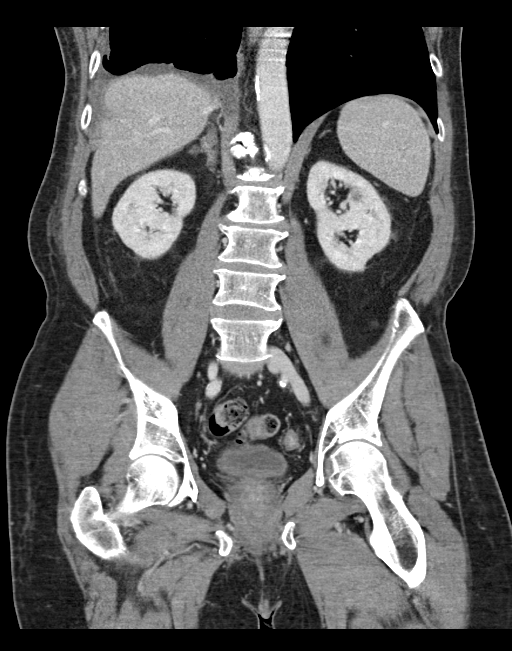

[14 of 46 positions shown; findings below may reference images not displayed]

RADIATION DOSE REDUCTION: This exam was performed according to the
departmental dose-optimization program which includes automated
exposure control, adjustment of the mA and/or kV according to
patient size and/or use of iterative reconstruction technique.

CONTRAST:  100mL OMNIPAQUE IOHEXOL 350 MG/ML SOLN
FINDINGS: CTA CHEST FINDINGS

Cardiovascular: This is a technically adequate evaluation of the
pulmonary vasculature. No filling defects or pulmonary emboli.

The heart is unremarkable without pericardial effusion. Stable
ectasia of the thoracic aorta without frank aneurysm. Postsurgical
changes from prior CABG. No dissection.

Mediastinum/Nodes: Stable mediastinal adenopathy, pretracheal lymph
node image [DATE] measuring 12 mm and right paratracheal lymph node
image [DATE] measuring 10 mm in short axis. Stable soft tissue density
at the right hilum measuring up to 14 mm in short axis, consistent
with either adenopathy or residual neoplasm. No new adenopathy.
Thyroid, trachea, and esophagus are unremarkable.

Lungs/Pleura: Stable bilateral pulmonary nodules consistent with
metastatic lung cancer. Index lesion left upper lobe abutting the
major fissure image 45/8 measures 12 mm, stable. Small loculated
right pleural effusion and associated right pleural thickening
unchanged. No new airspace disease or pneumothorax. Central airways
are patent.

Musculoskeletal: Mix sclerosis and lucency of the right posterior
eleventh and twelfth ribs again noted, which could reflect
metastatic disease or prior postsurgical change. Chronic right
posterior eleventh rib fracture. No new bony abnormalities.
Reconstructed images demonstrate no additional findings.

Review of the MIP images confirms the above findings.

CT ABDOMEN and PELVIS FINDINGS

Hepatobiliary: Stable punctate hypodensities within the right liver
are too small to characterize, statistically likely small cysts. No
biliary duct dilation. No evidence of cholelithiasis or
cholecystitis.

Pancreas: Unremarkable. No pancreatic ductal dilatation or
surrounding inflammatory changes.

Spleen: Normal in size without focal abnormality.

Adrenals/Urinary Tract: Stable appearance of the kidneys, adrenals,
and bladder. No urinary tract calculi or obstructive uropathy.

Stomach/Bowel: No bowel obstruction or ileus. Normal appendix right
lower quadrant. No bowel wall thickening or inflammatory change.

Vascular/Lymphatic: Aortic atherosclerosis. No enlarged abdominal or
pelvic lymph nodes.

Reproductive: Prostate is unremarkable.

Other: No free fluid or free intraperitoneal gas. No abdominal wall
hernia.

Musculoskeletal: No acute or destructive bony lesions. Reconstructed
images demonstrate no additional findings.

Review of the MIP images confirms the above findings.
IMPRESSION: 1. No evidence of pulmonary emboli.
2. Bilateral pulmonary nodules consistent with known history of
metastatic lung cancer. No change since recent staging exam.
3. Stable mediastinal and right hilar adenopathy consistent with
metastatic disease.
4. Stable right pleural thickening and small loculated right pleural
effusion.
5. Stable changes of the right posterior eleventh and twelfth ribs,
which may reflect postsurgical change or underlying metastatic
disease. Chronic right posterior eleventh rib fracture again noted.
6. No acute intra-abdominal or intrapelvic process.
7.  Aortic Atherosclerosis ([id number]).

## 2023-10-16 IMAGING — CT CT ANGIO CHEST
2 of 7 series · 16 of 46 positions shown · IV contrast (agent unspecified)
Comparison: 12/04/2021

CLINICAL DATA: Bilateral lower extremity edema, abdominal pain,
nausea and vomiting for 2 weeks, metastatic lung cancer



[Series 3: thins · axial · 0.78mm/px · z∈[+1444,+1736]mm · 13 of 334 slices shown]
[im 21/334  lung]
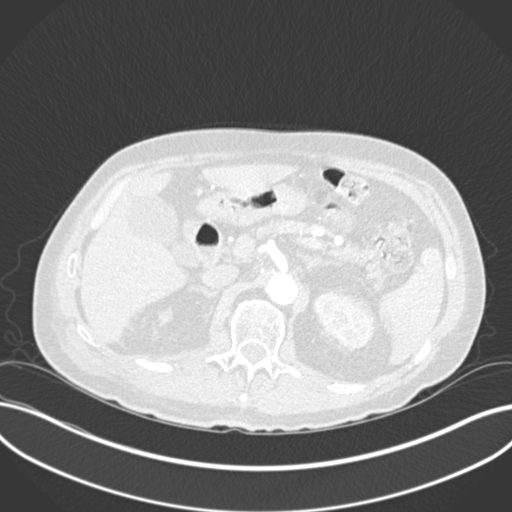
[im 42/334  soft-tissue]
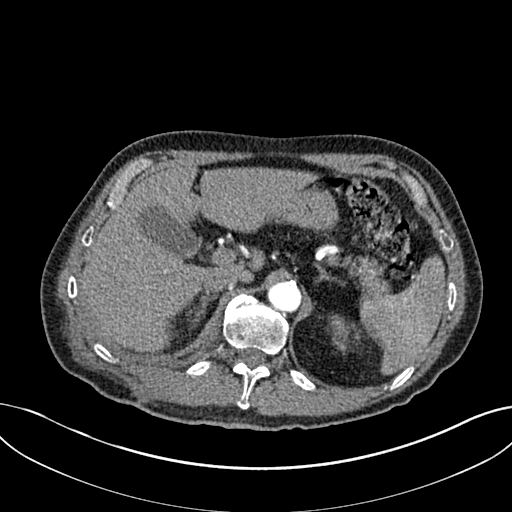
[im 63/334  lung]
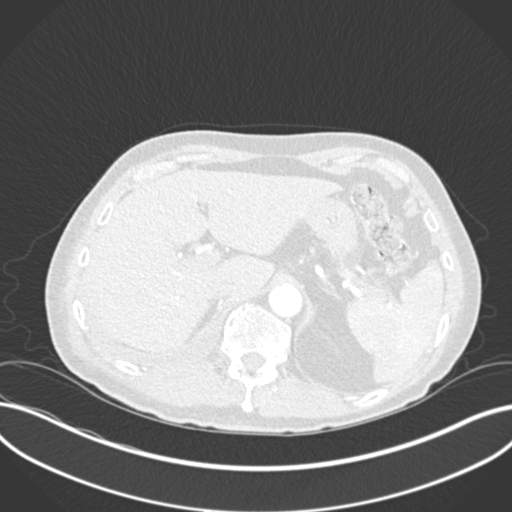
[im 105/334  soft-tissue]
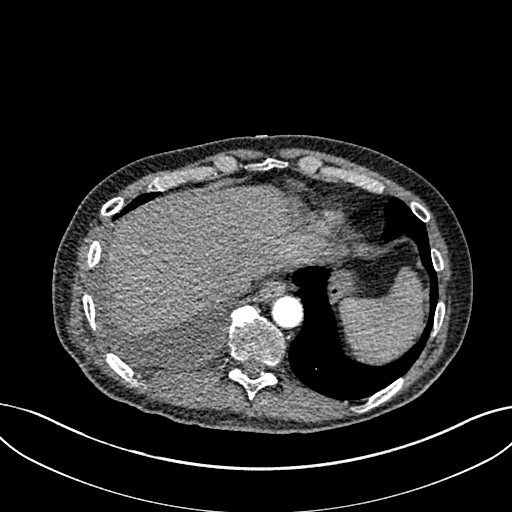
[im 125/334  lung]
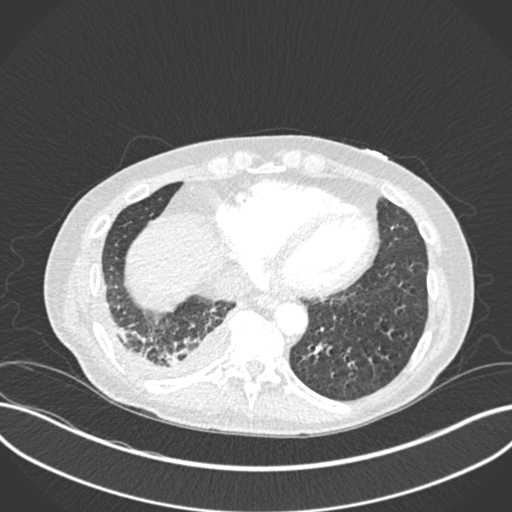
[im 146/334  soft-tissue]
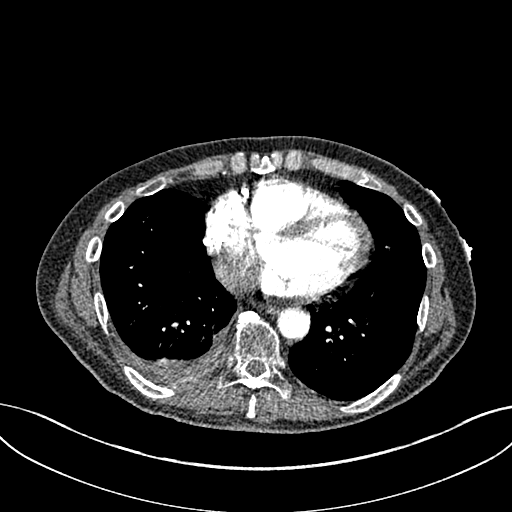
[im 167/334  lung]
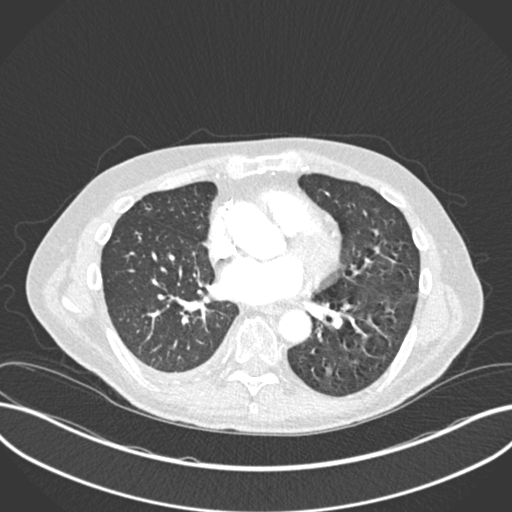
[im 188/334  soft-tissue]
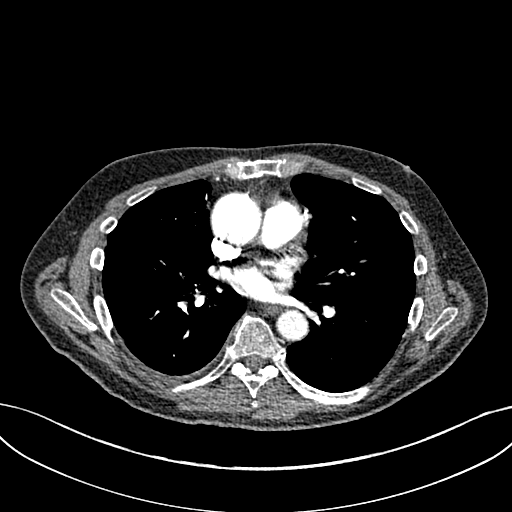
[im 209/334  lung]
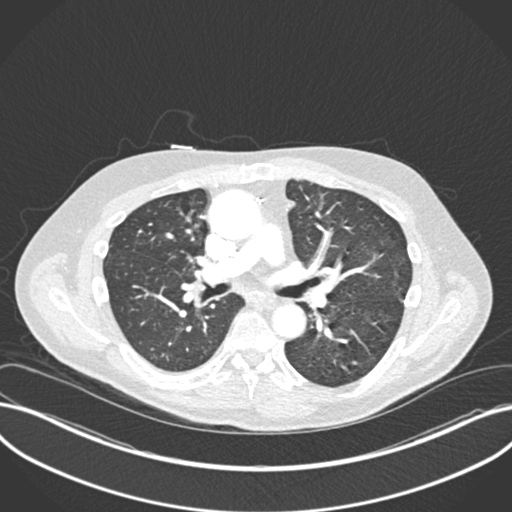
[im 229/334  soft-tissue]
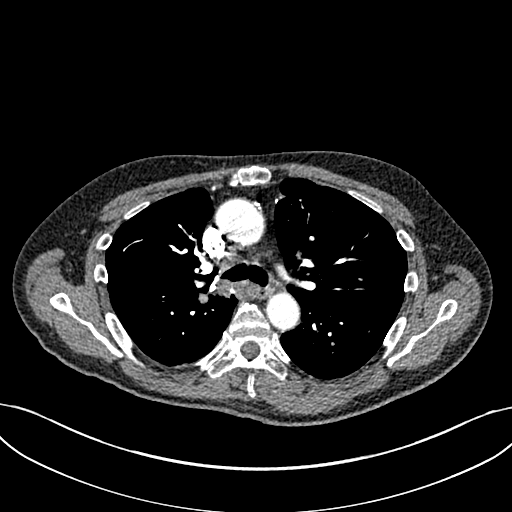
[im 271/334  lung]
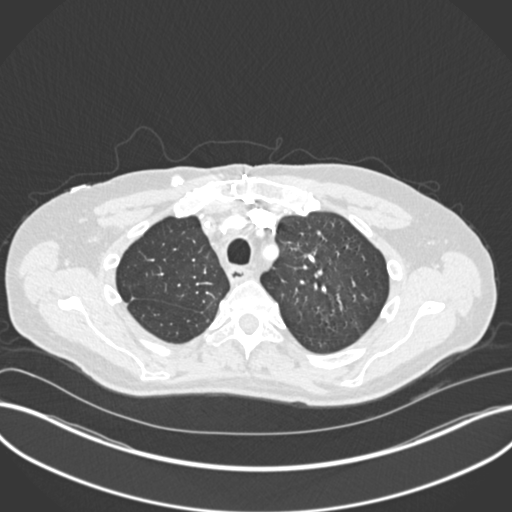
[im 292/334  soft-tissue]
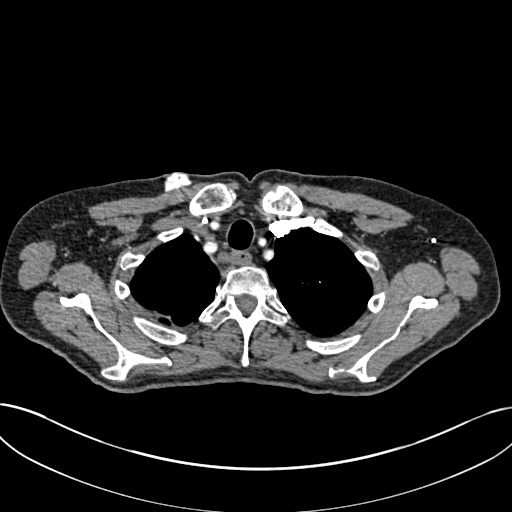
[im 313/334  lung]
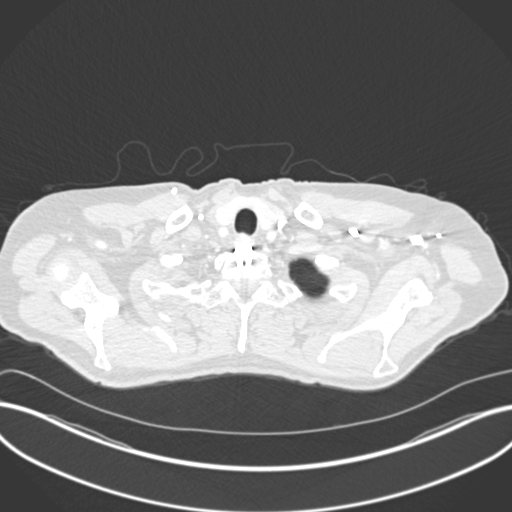

[Series 4: coronal mpr · coronal · 0.71mm/px · 3 of 114 slices shown]
[im 29/114  soft-tissue]
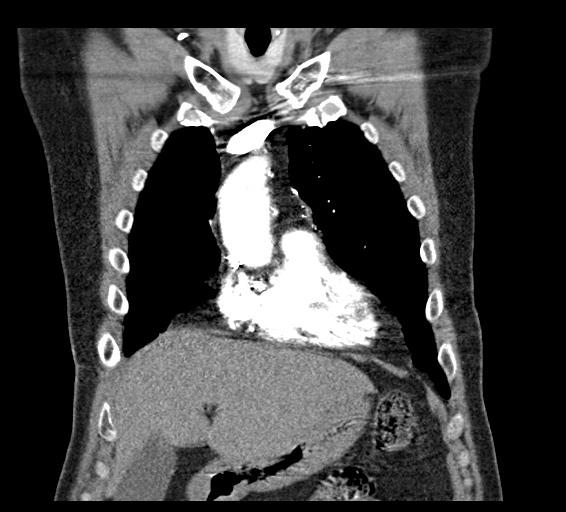
[im 57/114  soft-tissue]
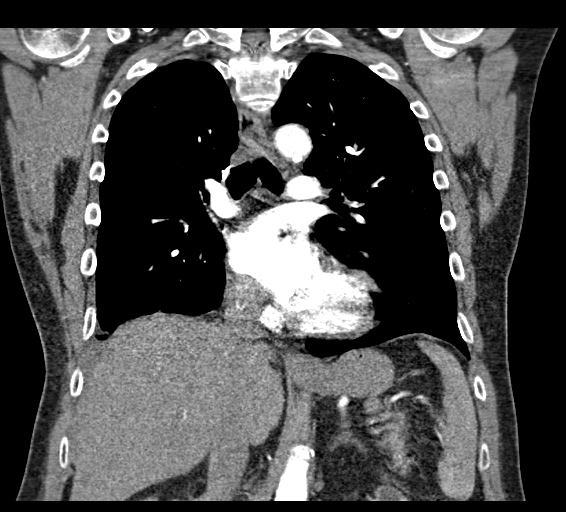
[im 85/114  soft-tissue]
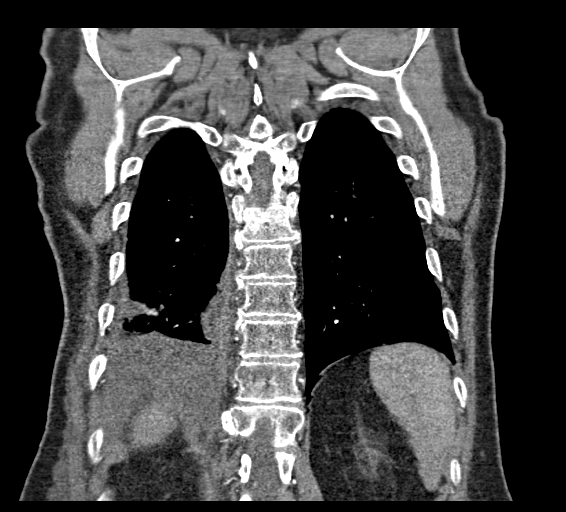

[16 of 46 positions shown; findings below may reference images not displayed]

RADIATION DOSE REDUCTION: This exam was performed according to the
departmental dose-optimization program which includes automated
exposure control, adjustment of the mA and/or kV according to
patient size and/or use of iterative reconstruction technique.

CONTRAST:  100mL OMNIPAQUE IOHEXOL 350 MG/ML SOLN
FINDINGS: CTA CHEST FINDINGS

Cardiovascular: This is a technically adequate evaluation of the
pulmonary vasculature. No filling defects or pulmonary emboli.

The heart is unremarkable without pericardial effusion. Stable
ectasia of the thoracic aorta without frank aneurysm. Postsurgical
changes from prior CABG. No dissection.

Mediastinum/Nodes: Stable mediastinal adenopathy, pretracheal lymph
node image [DATE] measuring 12 mm and right paratracheal lymph node
image [DATE] measuring 10 mm in short axis. Stable soft tissue density
at the right hilum measuring up to 14 mm in short axis, consistent
with either adenopathy or residual neoplasm. No new adenopathy.
Thyroid, trachea, and esophagus are unremarkable.

Lungs/Pleura: Stable bilateral pulmonary nodules consistent with
metastatic lung cancer. Index lesion left upper lobe abutting the
major fissure image 45/8 measures 12 mm, stable. Small loculated
right pleural effusion and associated right pleural thickening
unchanged. No new airspace disease or pneumothorax. Central airways
are patent.

Musculoskeletal: Mix sclerosis and lucency of the right posterior
eleventh and twelfth ribs again noted, which could reflect
metastatic disease or prior postsurgical change. Chronic right
posterior eleventh rib fracture. No new bony abnormalities.
Reconstructed images demonstrate no additional findings.

Review of the MIP images confirms the above findings.

CT ABDOMEN and PELVIS FINDINGS

Hepatobiliary: Stable punctate hypodensities within the right liver
are too small to characterize, statistically likely small cysts. No
biliary duct dilation. No evidence of cholelithiasis or
cholecystitis.

Pancreas: Unremarkable. No pancreatic ductal dilatation or
surrounding inflammatory changes.

Spleen: Normal in size without focal abnormality.

Adrenals/Urinary Tract: Stable appearance of the kidneys, adrenals,
and bladder. No urinary tract calculi or obstructive uropathy.

Stomach/Bowel: No bowel obstruction or ileus. Normal appendix right
lower quadrant. No bowel wall thickening or inflammatory change.

Vascular/Lymphatic: Aortic atherosclerosis. No enlarged abdominal or
pelvic lymph nodes.

Reproductive: Prostate is unremarkable.

Other: No free fluid or free intraperitoneal gas. No abdominal wall
hernia.

Musculoskeletal: No acute or destructive bony lesions. Reconstructed
images demonstrate no additional findings.

Review of the MIP images confirms the above findings.
IMPRESSION: 1. No evidence of pulmonary emboli.
2. Bilateral pulmonary nodules consistent with known history of
metastatic lung cancer. No change since recent staging exam.
3. Stable mediastinal and right hilar adenopathy consistent with
metastatic disease.
4. Stable right pleural thickening and small loculated right pleural
effusion.
5. Stable changes of the right posterior eleventh and twelfth ribs,
which may reflect postsurgical change or underlying metastatic
disease. Chronic right posterior eleventh rib fracture again noted.
6. No acute intra-abdominal or intrapelvic process.
7.  Aortic Atherosclerosis ([id number]).

## 2023-11-09 IMAGING — CT CT ABD-PELV W/ CM
2 of 5 series · 16 of 46 positions shown, 18 images · IV contrast (Omnipaque)
Comparison: 12/17/2021

CLINICAL DATA: Abdominal pain, nausea and vomiting for 2 days,
ongoing chemotherapy, metastatic lung cancer * Tracking Code: BO *

EXAM:
CT ABDOMEN AND PELVIS WITH CONTRAST
TECHNIQUE: Multidetector CT imaging of the abdomen and pelvis was performed
using the standard protocol following bolus administration of
intravenous contrast.

[Series 2: axial st · axial · 0.91mm/px · z∈[-486,-66]mm · 13 of 96 slices shown, 15 images]
[im 6/96  soft-tissue]
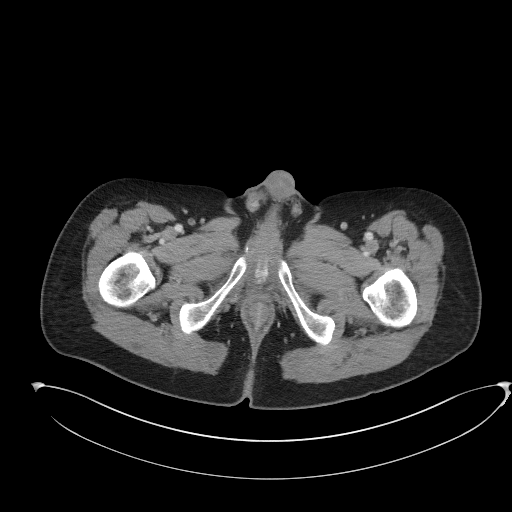
[im 6/96  bone]
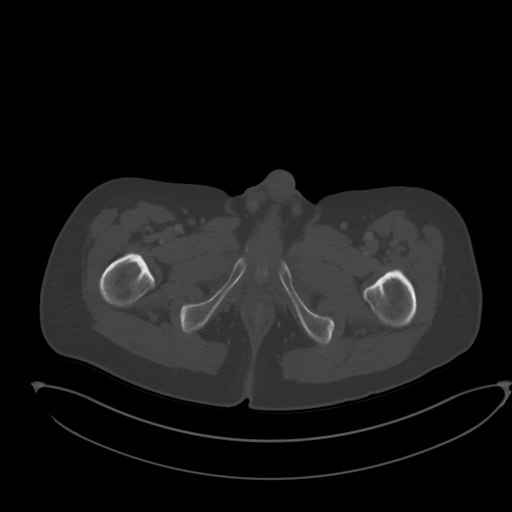
[im 11/96  soft-tissue]
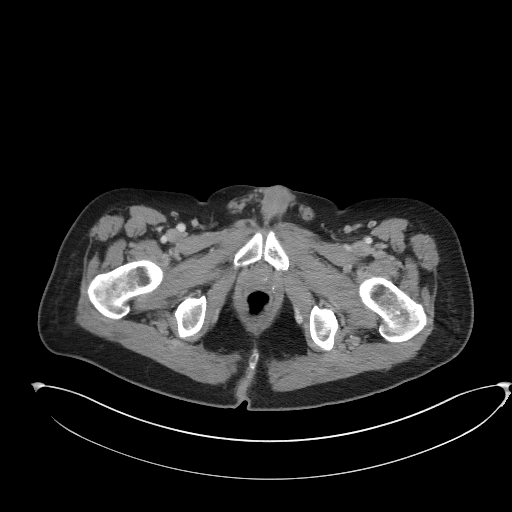
[im 22/96  soft-tissue]
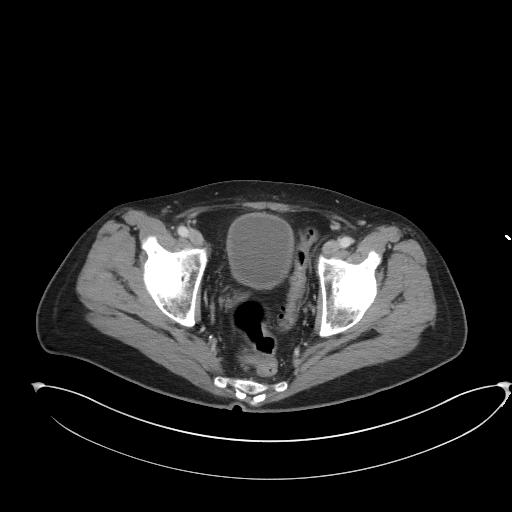
[im 27/96  soft-tissue]
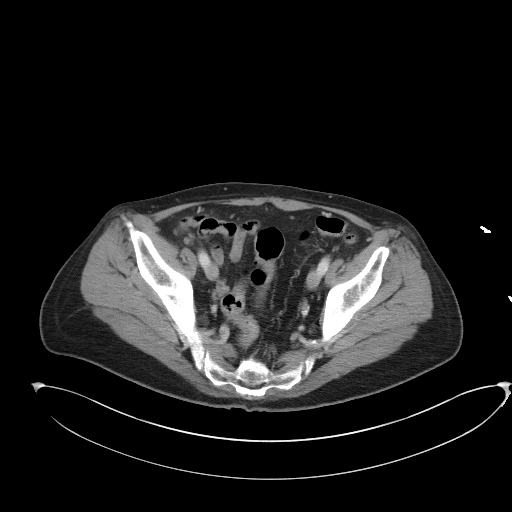
[im 32/96  soft-tissue]
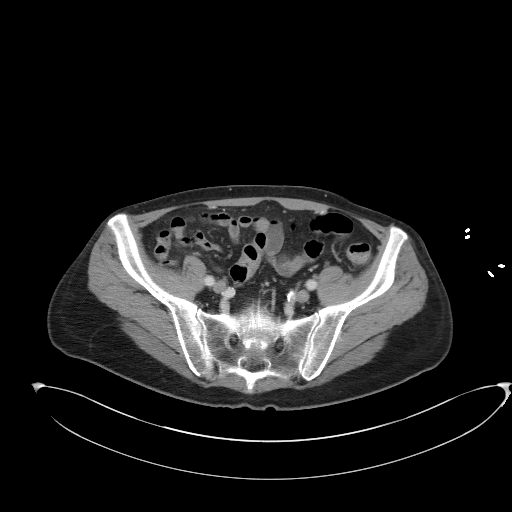
[im 43/96  soft-tissue]
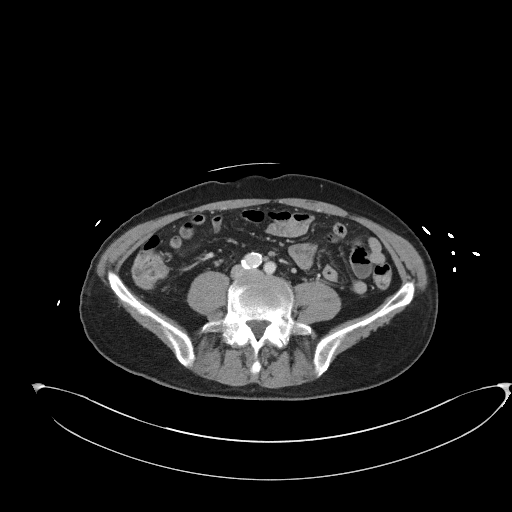
[im 48/96  soft-tissue]
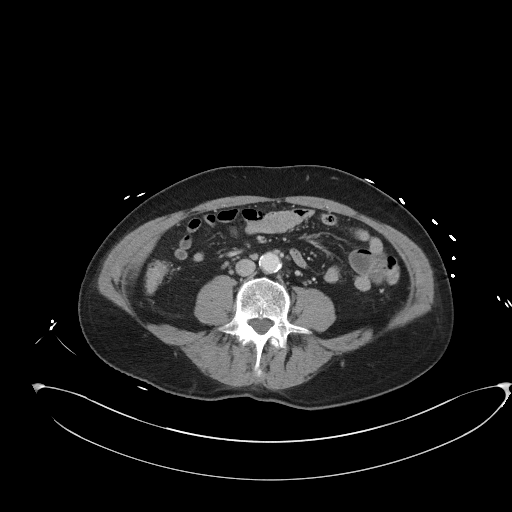
[im 53/96  soft-tissue]
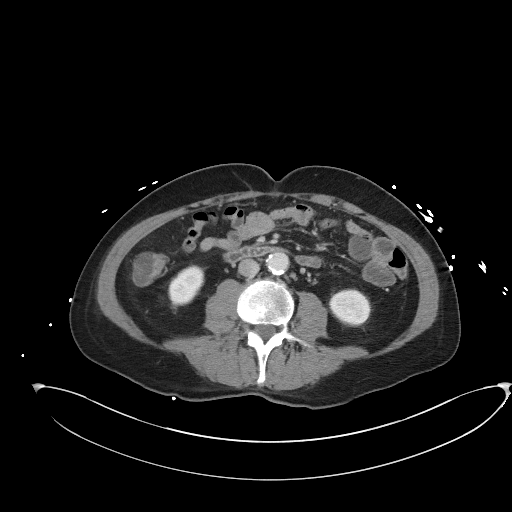
[im 64/96  soft-tissue]
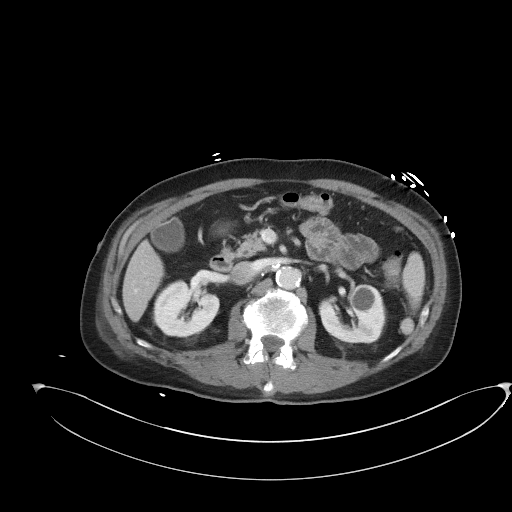
[im 64/96  bone]
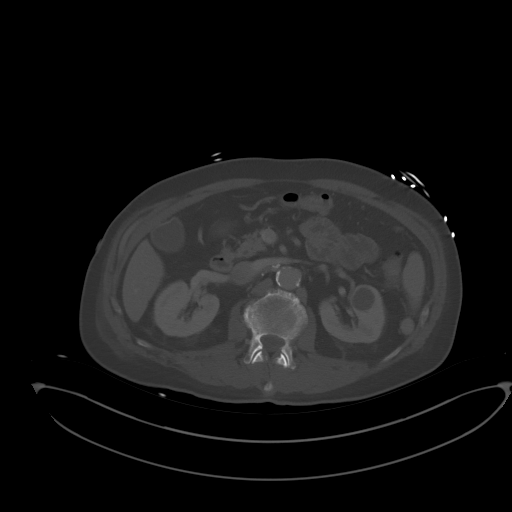
[im 69/96  soft-tissue]
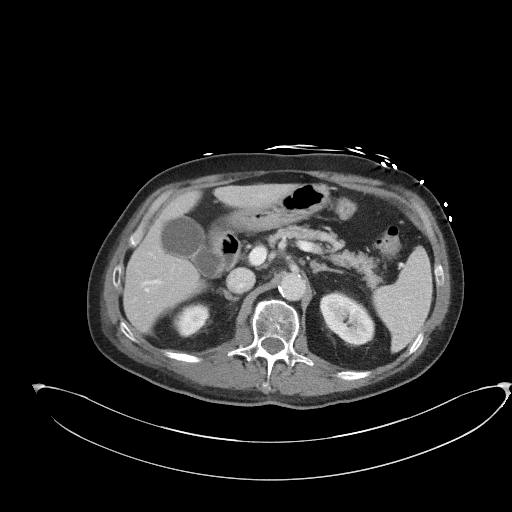
[im 74/96  soft-tissue]
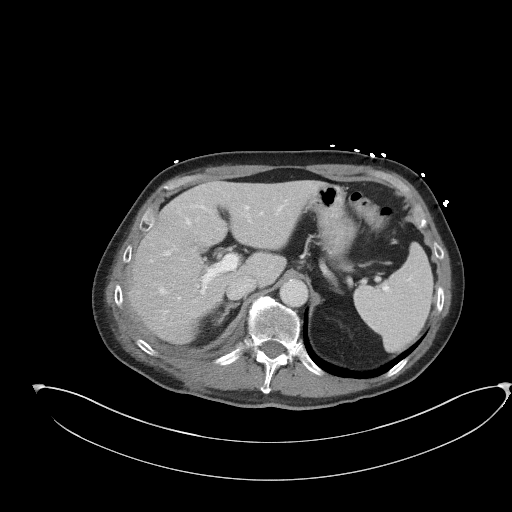
[im 85/96  soft-tissue]
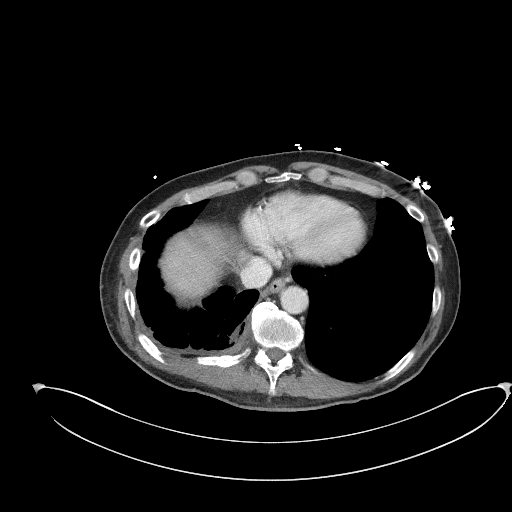
[im 90/96  soft-tissue]
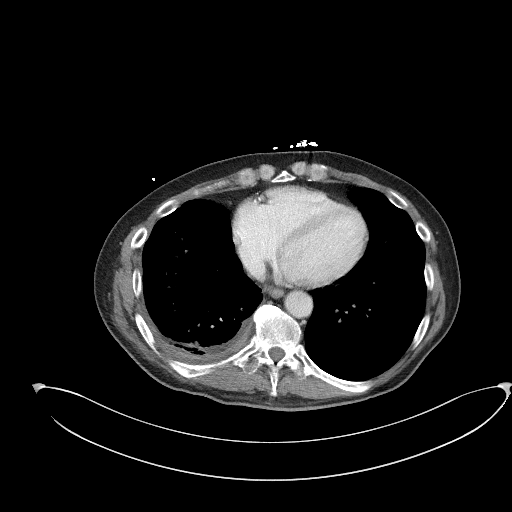

[Series 5: coronal st · coronal · 0.84mm/px · 3 of 101 slices shown]
[im 34/101  soft-tissue]
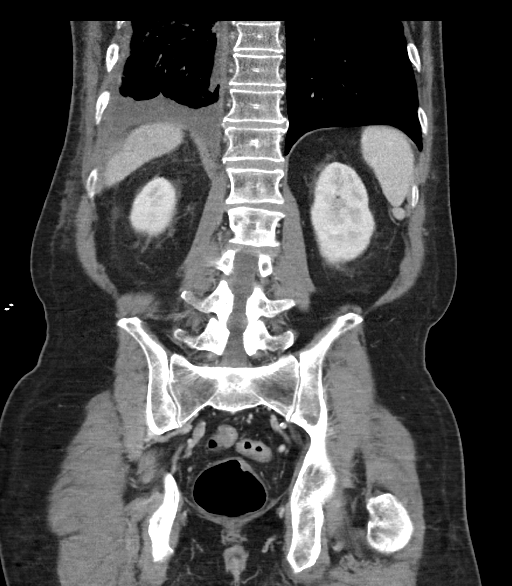
[im 45/101  soft-tissue]
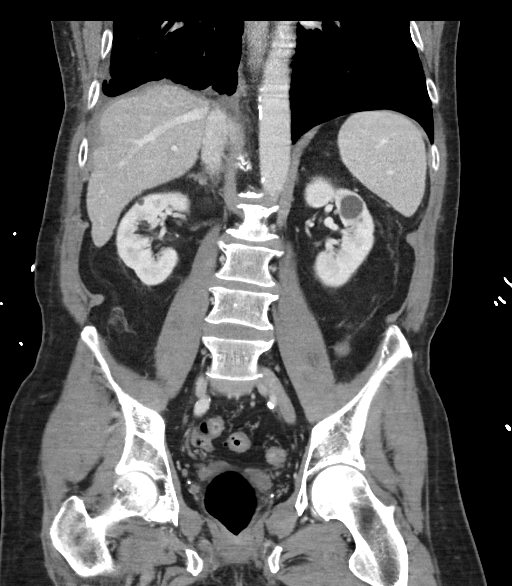
[im 56/101  soft-tissue]
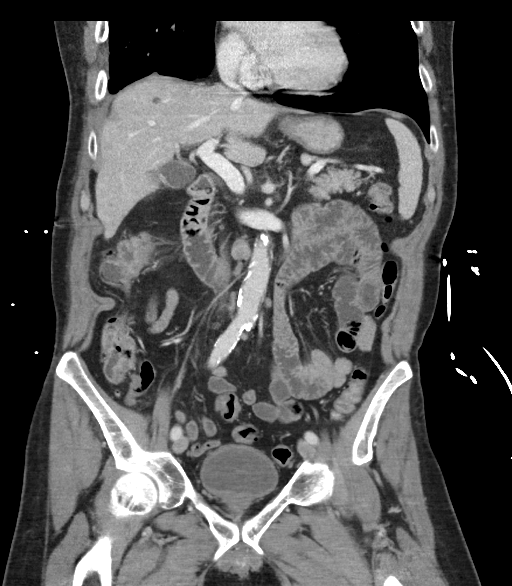

[16 of 46 positions shown; findings below may reference images not displayed]

RADIATION DOSE REDUCTION: This exam was performed according to the
departmental dose-optimization program which includes automated
exposure control, adjustment of the mA and/or kV according to
patient size and/or use of iterative reconstruction technique.

CONTRAST:  100mL OMNIPAQUE IOHEXOL 300 MG/ML  SOLN
FINDINGS: Lower chest: Small, chronic, loculated right pleural effusion with
associated pleural thickening. Coronary artery calcifications.

Hepatobiliary: No solid liver abnormality is seen. No gallstones,
gallbladder wall thickening, or biliary dilatation.

Pancreas: Unremarkable. No pancreatic ductal dilatation or
surrounding inflammatory changes.

Spleen: Normal in size without significant abnormality.

Adrenals/Urinary Tract: Adrenal glands are unremarkable. Kidneys are
normal, without renal calculi, solid lesion, or hydronephrosis.
Bladder is unremarkable.

Stomach/Bowel: Stomach is within normal limits. Appendix appears
normal. No evidence of bowel wall thickening, distention, or
inflammatory changes. Sigmoid diverticula.

Vascular/Lymphatic: Aortic atherosclerosis. No enlarged abdominal or
pelvic lymph nodes.

Reproductive: No mass or other significant abnormality.

Other: No abdominal wall hernia or abnormality. No ascites.

Musculoskeletal: No acute osseous findings. Unchanged sclerotic
fractures of the posterior right eleventh and twelfth ribs (series
2, image 13).
IMPRESSION: 1. No acute CT findings of the abdomen or pelvis to explain
abdominal pain, nausea or vomiting.
2. Sigmoid diverticulosis without evidence of acute diverticulitis.
3. Small, chronic, loculated right pleural effusion with associated
pleural thickening.
4. Coronary artery disease.

Aortic Atherosclerosis ([id number]).

## 2023-11-13 IMAGING — DX DG CHEST 1V PORT
1 series · 1 of 1 positions shown · non-contrast
Comparison: Prior chest x-ray 10/02/2021; CT scan of the chest
12/17/2021

CLINICAL DATA: Stage IV lung cancer, hematemesis

EXAM:
PORTABLE CHEST 1 VIEW

[chest ap]
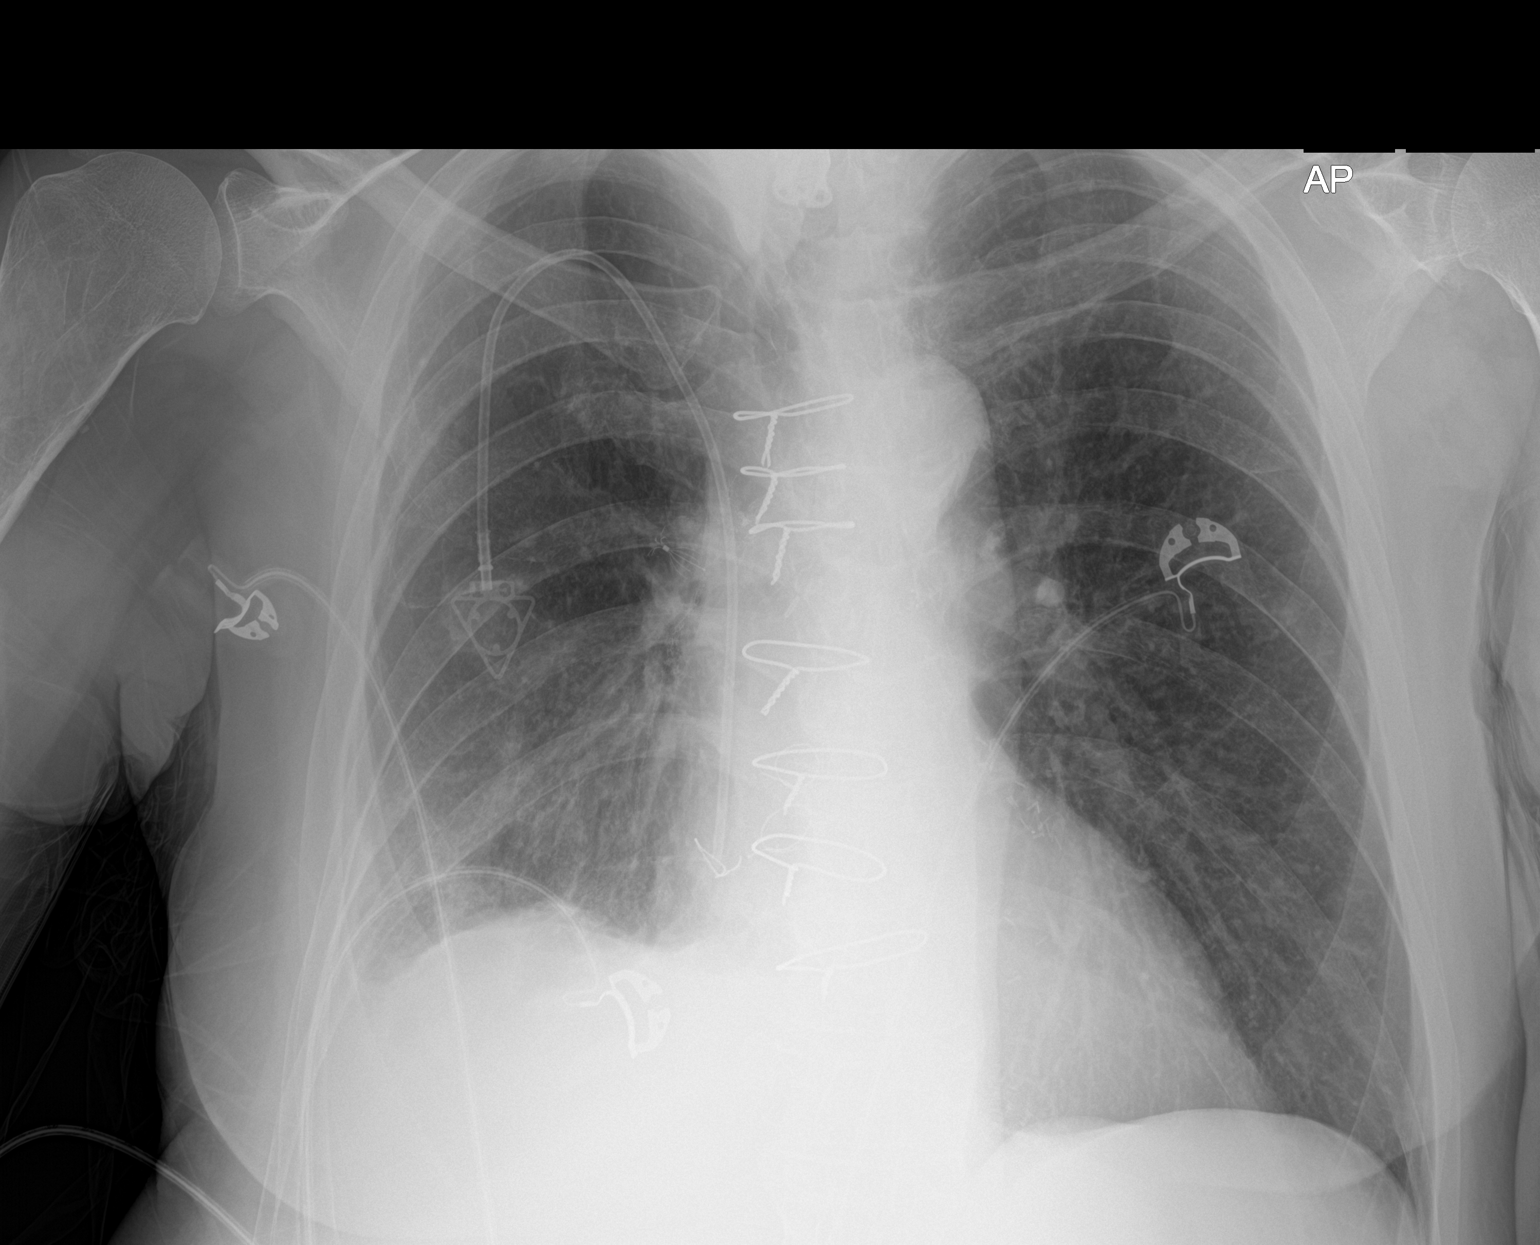

[1 of 1 positions shown; findings below may reference images not displayed]

FINDINGS: Right IJ approach single-lumen power injectable port catheter
remains in good position with the tip overlying the cavoatrial
junction. Patient is status post median sternotomy with evidence of
multivessel CABG. Chronic elevation of the right hemidiaphragm with
probable chronic small right pleural effusion. Multiple small
pulmonary nodules better seen on recent CT imaging. No pneumothorax.
No new focal airspace infiltrate.
IMPRESSION: 1. Stable chest x-ray without evidence of acute cardiopulmonary
process.
2. Stable chronic right pleural effusion and associated elevation of
the right hemidiaphragm.
3. Multiple small pulmonary nodules better demonstrated on recent CT
imaging.

## 2023-11-13 IMAGING — CT CT ABD-PELV W/ CM
2 of 5 series · 15 of 46 positions shown, 17 images · IV contrast (agent unspecified)
Comparison: CT 01/10/2022, 12/17/2021, 10/02/2021

CLINICAL DATA: Abdominal pain, acute, nonlocalized. History of
non-small cell lung cancer

EXAM:
CT ABDOMEN AND PELVIS WITH CONTRAST
TECHNIQUE: Multidetector CT imaging of the abdomen and pelvis was performed
using the standard protocol following bolus administration of
intravenous contrast.

[Series 2: axial st · axial · 0.95mm/px · z∈[-506,-42]mm · 12 of 105 slices shown, 14 images]
[im 6/105  soft-tissue]
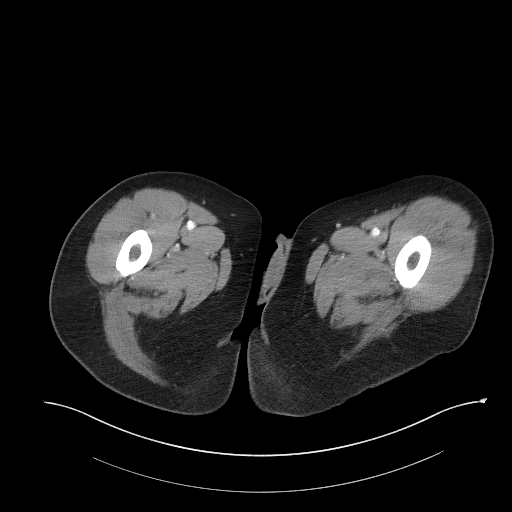
[im 6/105  bone]
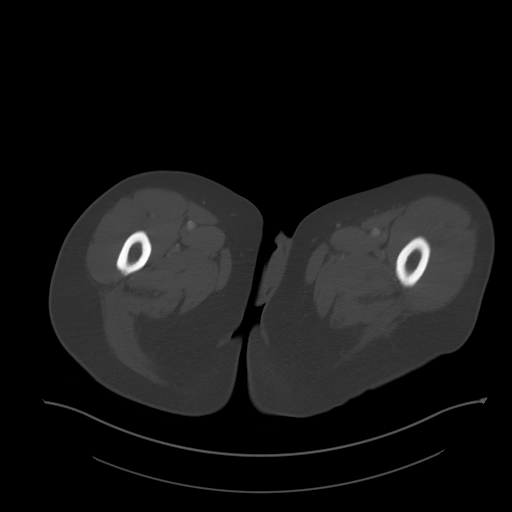
[im 17/105  soft-tissue]
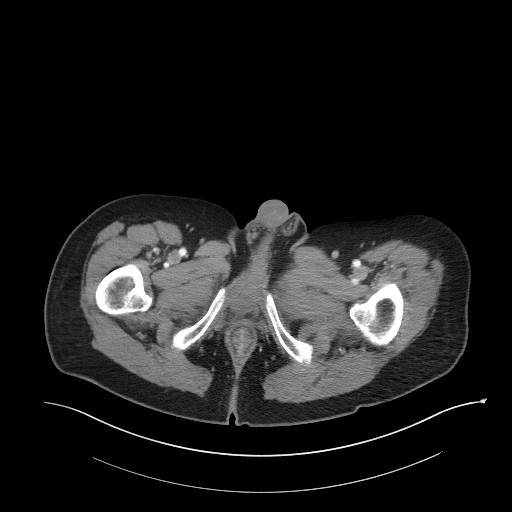
[im 22/105  soft-tissue]
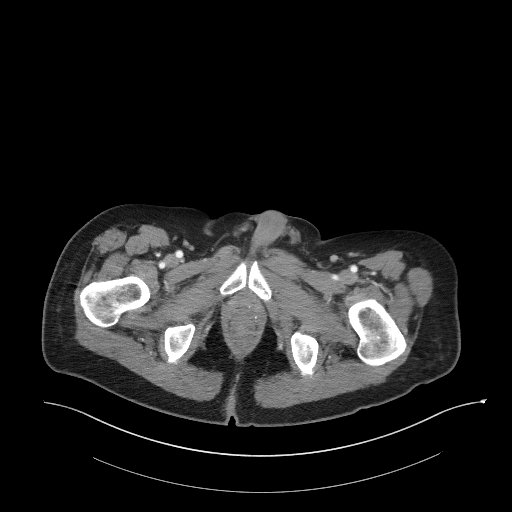
[im 33/105  soft-tissue]
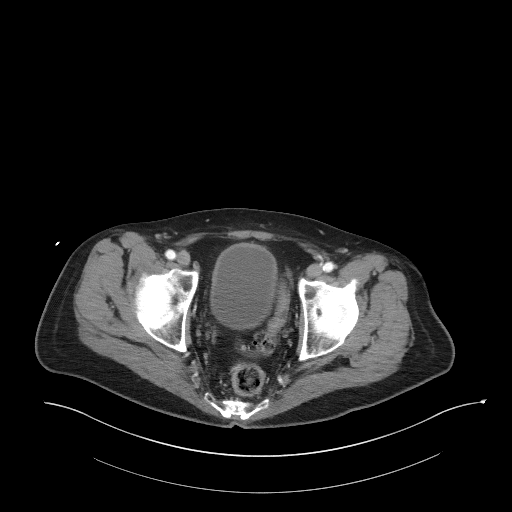
[im 39/105  soft-tissue]
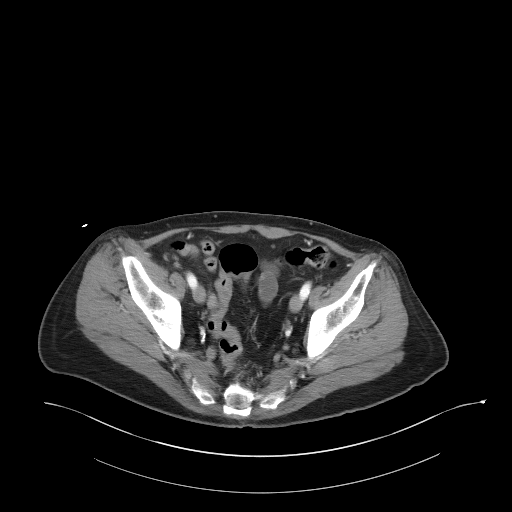
[im 50/105  soft-tissue]
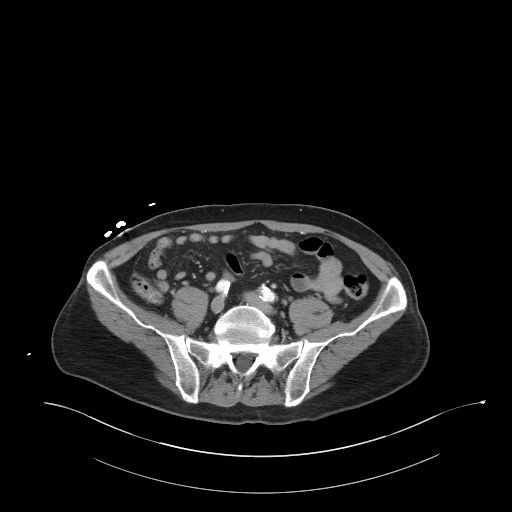
[im 55/105  soft-tissue]
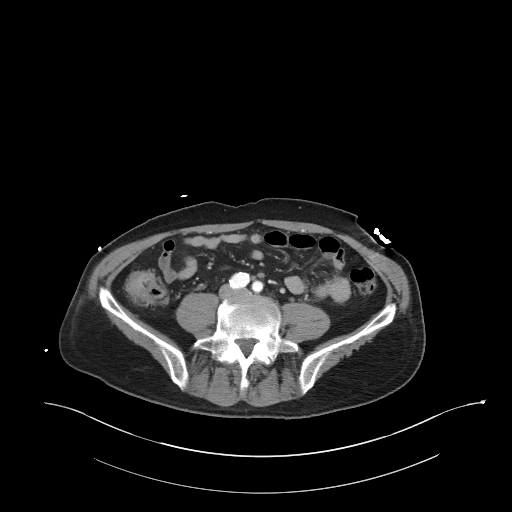
[im 66/105  soft-tissue]
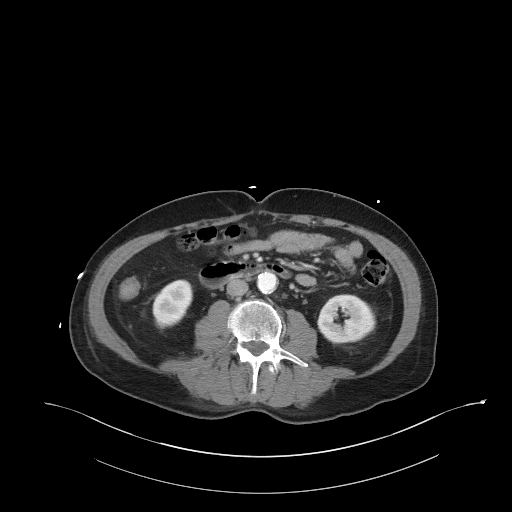
[im 72/105  soft-tissue]
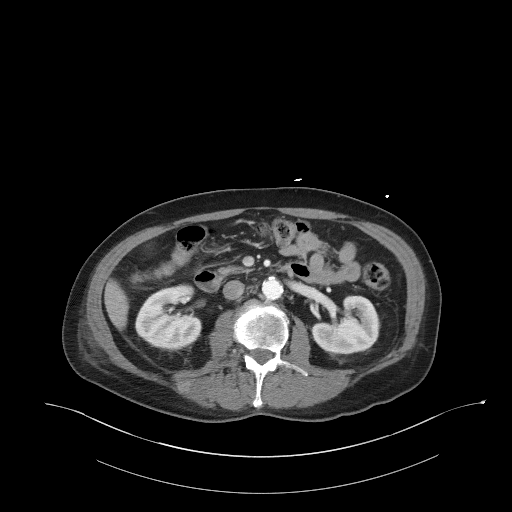
[im 72/105  bone]
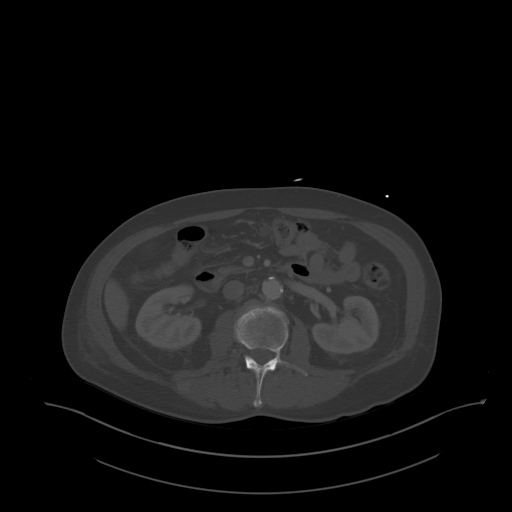
[im 83/105  soft-tissue]
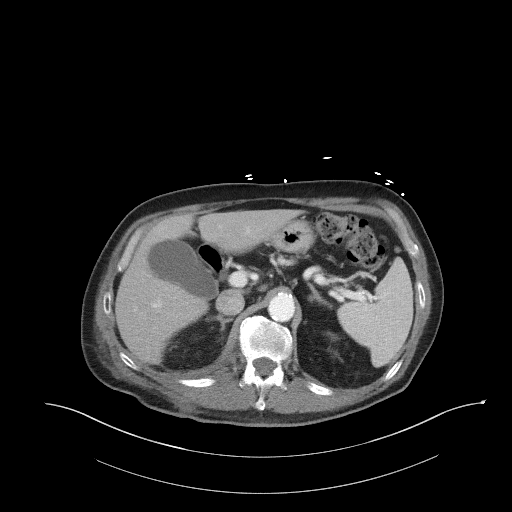
[im 88/105  soft-tissue]
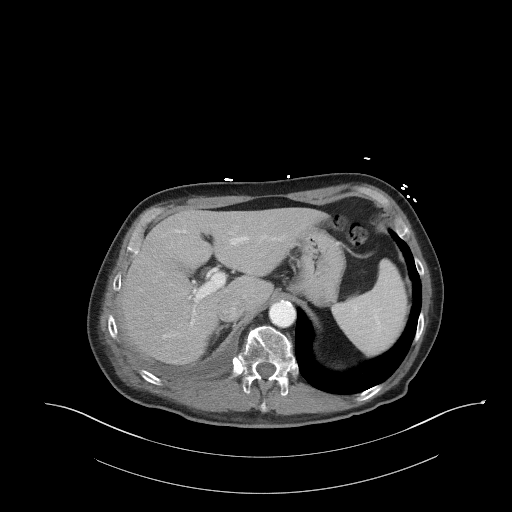
[im 99/105  soft-tissue]
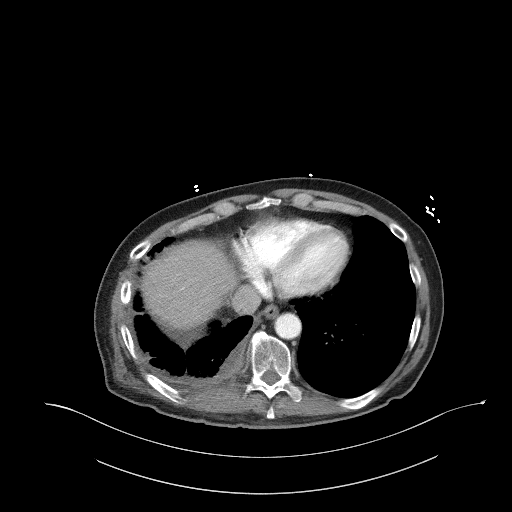

[Series 5: coronal st · coronal · 0.81mm/px · 3 of 82 slices shown]
[im 28/82  soft-tissue]
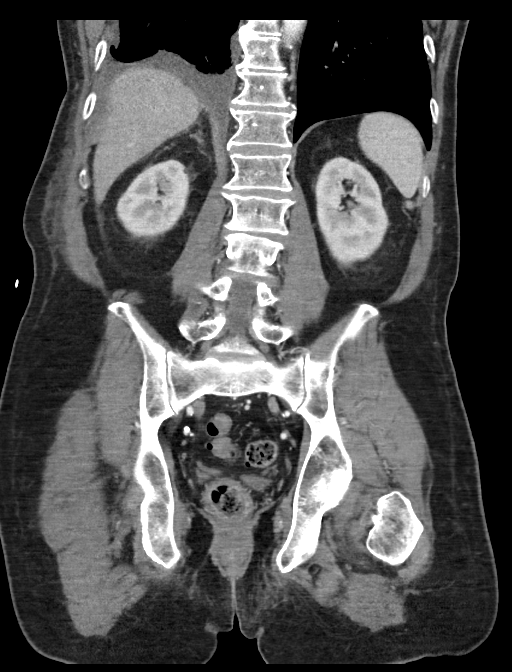
[im 37/82  soft-tissue]
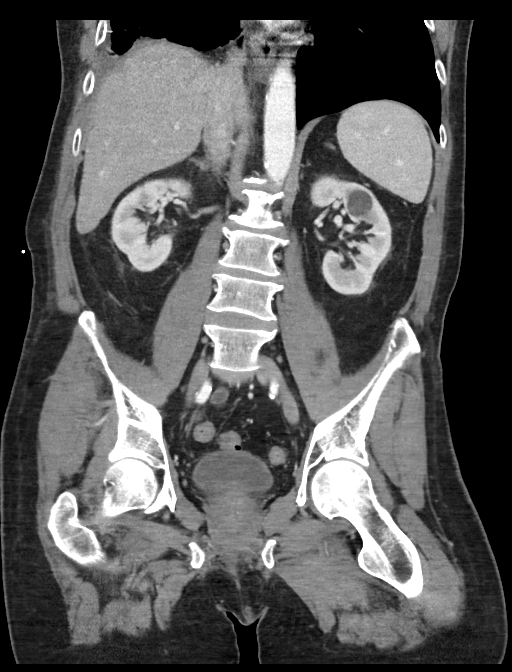
[im 46/82  soft-tissue]
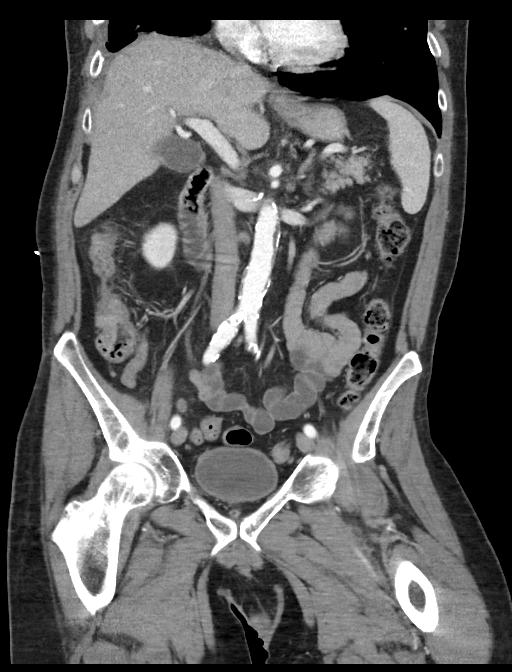

[15 of 46 positions shown; findings below may reference images not displayed]

RADIATION DOSE REDUCTION: This exam was performed according to the
departmental dose-optimization program which includes automated
exposure control, adjustment of the mA and/or kV according to
patient size and/or use of iterative reconstruction technique.

CONTRAST:  100mL OMNIPAQUE IOHEXOL 300 MG/ML  SOLN
FINDINGS: Lower chest: Chronic small right pleural effusion with associated
right pleural thickening. Patchy right lower lobe airspace
opacities. Multiple small pulmonary nodules within the left lung
base stable in appearance from prior and compatible with known
metastatic disease.

Hepatobiliary: No solid liver abnormality is seen. No gallstones,
gallbladder wall thickening, or biliary dilatation.

Pancreas: Unremarkable. No pancreatic ductal dilatation or
surrounding inflammatory changes.

Spleen: Normal in size without focal abnormality.

Adrenals/Urinary Tract: Adrenal glands are unremarkable. Kidneys are
normal, without renal calculi, solid lesion, or hydronephrosis.
Bladder is unremarkable.

Stomach/Bowel: Long segment circumferential wall thickening of the
ascending and proximal transverse colon with mild pericolonic fat
stranding. Normal appendix in the right lower quadrant (series 2,
image 59). No dilated loops of bowel. Stomach within normal limits.

Vascular/Lymphatic: Aortic atherosclerosis. No enlarged abdominal or
pelvic lymph nodes.

Reproductive: No mass or other significant abnormality.

Other: No free fluid. No abdominopelvic fluid collection. No
pneumoperitoneum. No abdominal wall hernia.

Musculoskeletal: Chronic deformities of the posterior right eleventh
and twelfth ribs, unchanged. No new focal bony abnormality. Focal
areas of nodularity within the soft tissues of the posterior lower
right chest wall posterior to the right eleventh and twelfth ribs at
least partially within the erector spinae musculature (series 2,
images 29-32). These have decreased in size compared to 10/02/2021.
IMPRESSION: 1. Long segment circumferential wall thickening of the ascending and
proximal transverse colon with mild pericolonic fat stranding,
suggestive of an infectious or inflammatory colitis.
2. Chronic small right pleural effusion with associated right
pleural thickening. Patchy right lower lobe airspace opacities which
could represent atelectasis and/or pneumonia.
3. Multiple small pulmonary nodules within the left lung base stable
in appearance from prior and compatible with known metastatic
disease.
4. Focal areas of soft tissue nodularity of the posterior lower
right chest wall, stable in appearance from prior and decreased in
size compared to 10/02/2021.

Aortic Atherosclerosis ([email or web address]).

## 2023-11-25 IMAGING — MR MR HEAD WO/W CM
12 series · 48 of 48 positions shown · IV contrast (multihance)
Comparison: Prior brain MRI 10/16/2021 and earlier.

CLINICAL DATA: 65-year-old male with metastatic large cell
neuroendocrine carcinoma of the right upper lobe. Brain metastases.
Prior SRS most recently 10/27/2021. Restaging.

EXAM:
MRI HEAD WITHOUT AND WITH CONTRAST
TECHNIQUE: Multiplanar, multiecho pulse sequences of the brain and surrounding
structures were obtained without and with intravenous contrast.
CONTRAST:  15mL MULTIHANCE GADOBENATE DIMEGLUMINE 529 MG/ML IV SOLN

[Series 6: FLAIR · sagittal · 3.0mm · 0.81mm/px · 3 of 39 slices shown (1 of 2)]
[im 1/39]
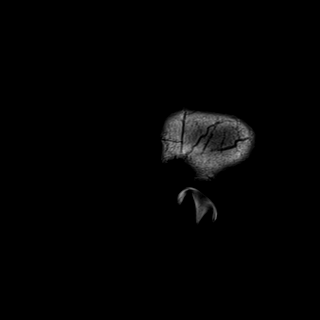
[im 20/39]
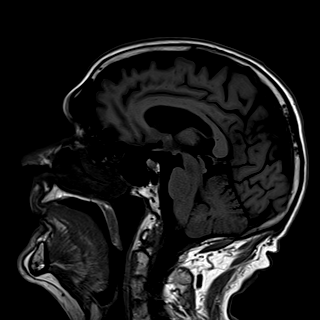
[im 39/39]
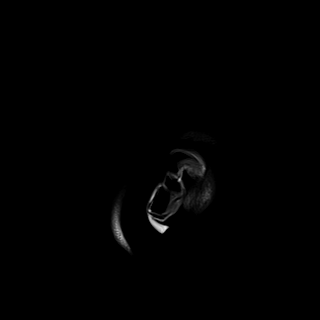

[Series 7: DWI · axial · 3.0mm · 1.50mm/px · z∈[-71,+68]mm · 4 of 82 slices shown (1 of 2)]
[im 1/82]
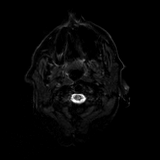
[im 28/82]
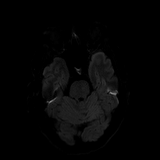
[im 55/82]
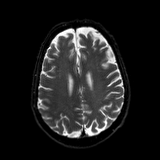
[im 82/82]
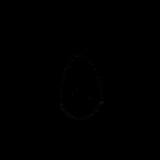

[Series 8: DWI · axial · 3.0mm · 1.50mm/px · z∈[-71,+68]mm · 2 of 40 slices shown (2 of 2)]
[im 1/40]
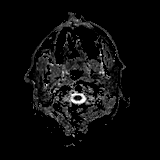
[im 40/40]
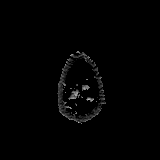

[Series 9: T2 · axial · 5.0mm · 0.57mm/px · 1 of 27 slices shown (1 of 2)]
[im 1/27]
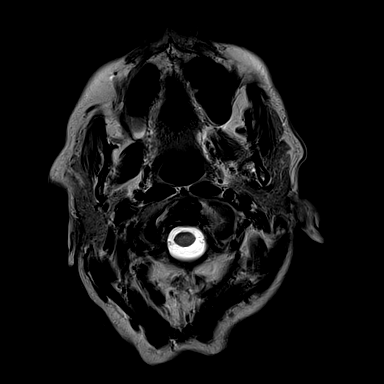

[Series 11: swi_images · axial · 1.5mm · 0.94mm/px · z∈[-80,+61]mm · 5 of 104 slices shown]
[im 1/104]
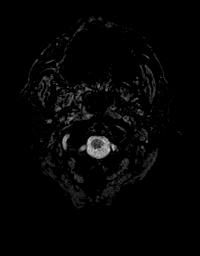
[im 26/104]
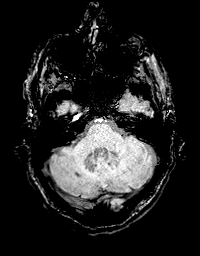
[im 52/104]
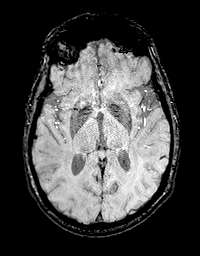
[im 78/104]
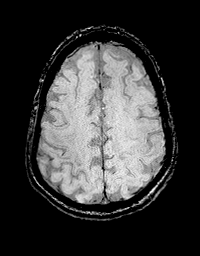
[im 104/104]
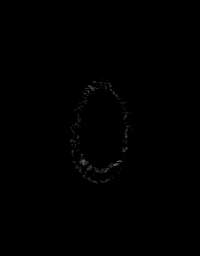

[Series 12: FLAIR · axial · 3.0mm · 0.86mm/px · z∈[-121,+47]mm · 3 of 57 slices shown (2 of 2)]
[im 1/57]
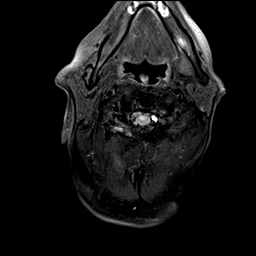
[im 29/57]
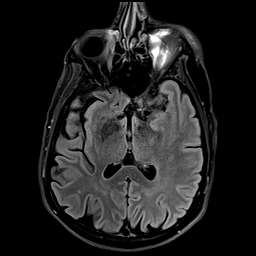
[im 57/57]
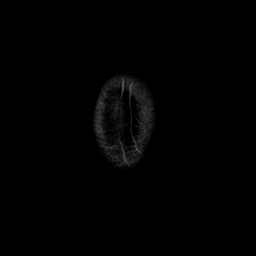

[Series 13: T2 · axial · non-contrast · 1.0mm · 0.86mm/px · z∈[-123,+48]mm · 8 of 176 slices shown (2 of 2)]
[im 1/176]
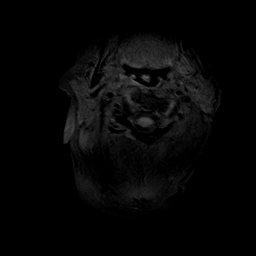
[im 26/176]
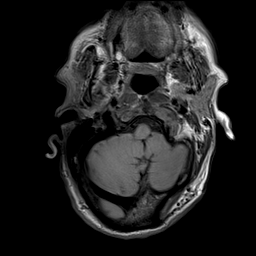
[im 51/176]
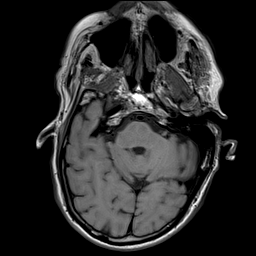
[im 76/176]
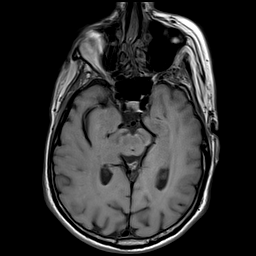
[im 101/176]
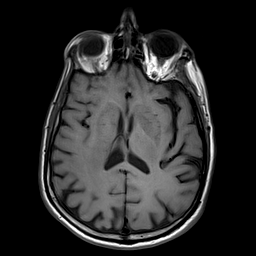
[im 126/176]
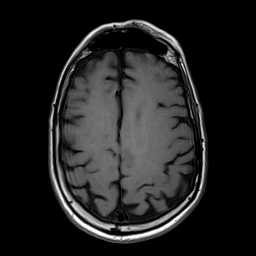
[im 151/176]
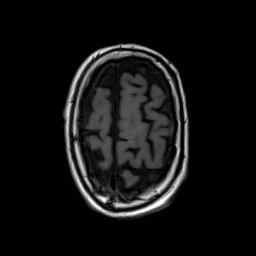
[im 176/176]
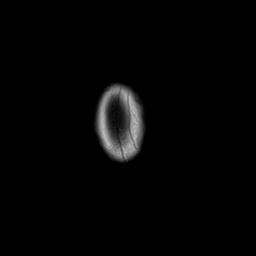

[Series 14: T2 post-contrast · coronal · 3.0mm · 0.57mm/px · 2 of 45 slices shown (1 of 2)]
[im 1/45]
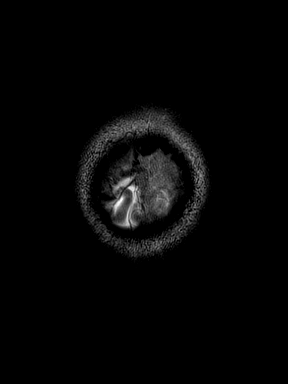
[im 45/45]
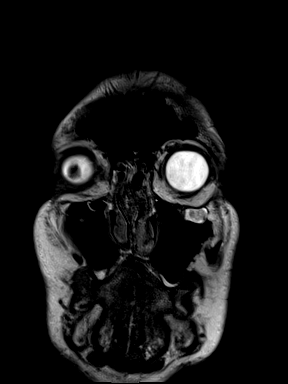

[Series 15: T2 post-contrast · axial · 1.0mm · 0.86mm/px · z∈[-123,+48]mm · 8 of 176 slices shown (2 of 2)]
[im 1/176]
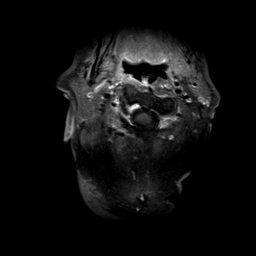
[im 26/176]
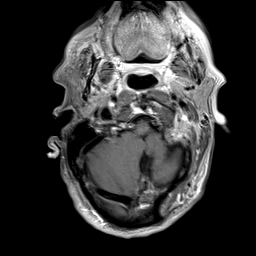
[im 51/176]
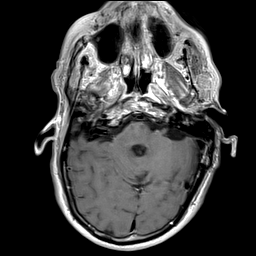
[im 76/176]
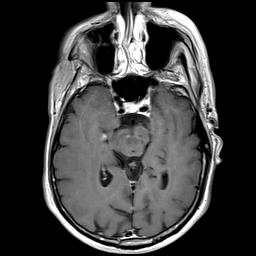
[im 101/176]
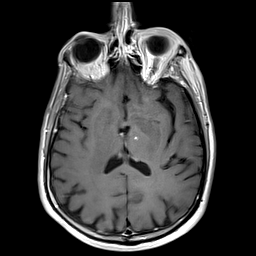
[im 126/176]
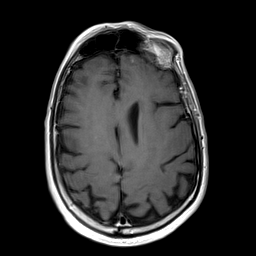
[im 151/176]
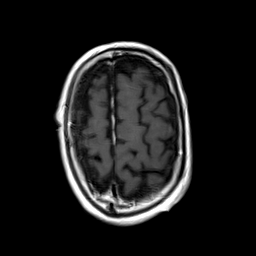
[im 176/176]
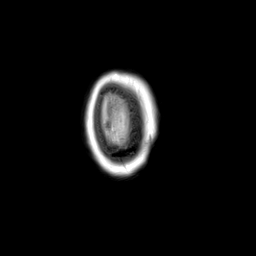

[Series 16: T1 post-contrast · axial · 1.0mm · 0.75mm/px · z∈[-125,+50]mm · 8 of 176 slices shown (1 of 2)]
[im 1/176]
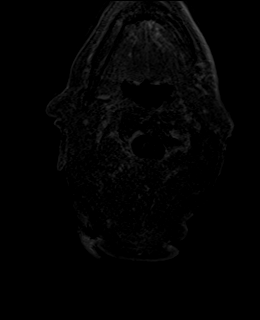
[im 26/176]
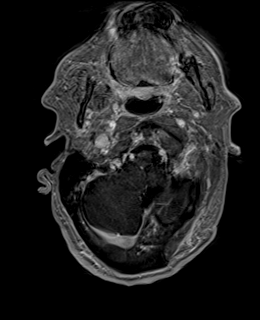
[im 51/176]
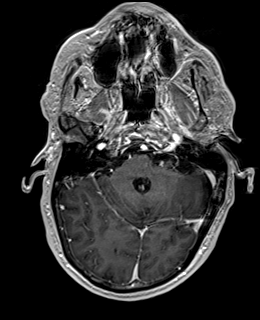
[im 76/176]
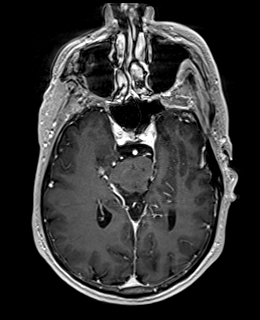
[im 101/176]
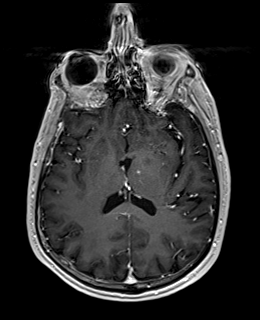
[im 126/176]
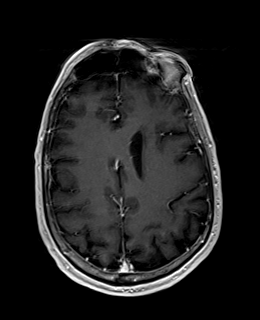
[im 151/176]
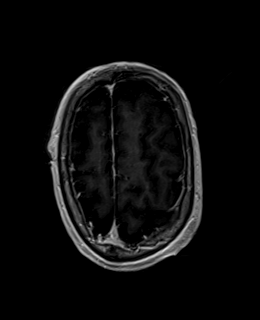
[im 176/176]
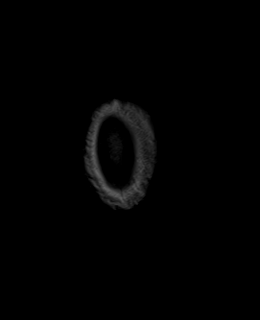

[Series 17: T1 post-contrast · coronal · 3.0mm · 0.57mm/px · 2 of 45 slices shown (2 of 2)]
[im 1/45]
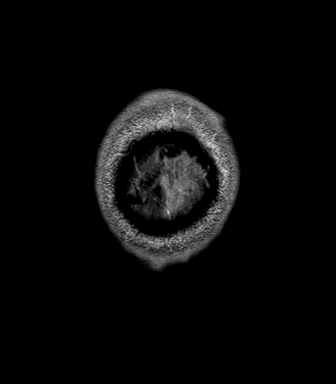
[im 45/45]
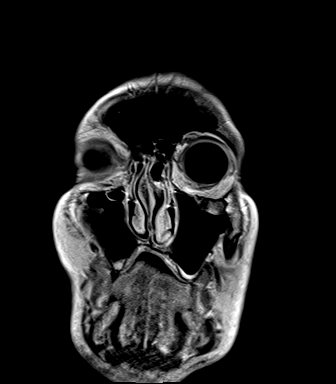

[Series 18: FLAIR post-contrast · sagittal · 3.0mm · 0.81mm/px · 2 of 39 slices shown]
[im 1/39]
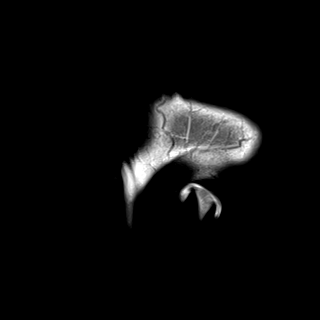
[im 39/39]
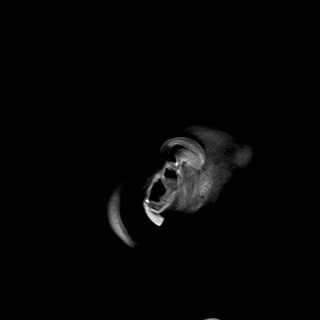

[48 of 48 positions shown; findings below may reference images not displayed]

FINDINGS: BRAIN

New Lesions:

Approximately 15 new small enhancing metastases (at least 6 in the
cerebellum alone). Many are punctate. There are 2 adjacent lesions
in the inferior medial right occipital lobe on series 15, image 58,
each 4-5 mm.

And questionable additional small new metastasis along the inferior
right temporal lobe on series 15, image 53.

All lesions annotated with double arrows on series 15.

Larger lesions:

The right perirolandic lesion on series 15, image 147 has increased
from about 4 mm in [DATE] mm now. Adjacent T2 and FLAIR
hyperintense vasogenic edema has also increased but is mild (series
12, image 46).

Left lateral cerebellar enhancing metastasis on series 15, image 45
appears slightly larger and more solidly enhancing. Surrounding
edema also appears slightly increased on series 12, image 13.

Stable or Smaller lesions:

Anterior left thalamic lesion has regressed on series 15, image 102,
now punctate. Mild regional edema has resolved.

Punctate left parietal lobe lesion seen previously on series 11,
image 129 is not evident today. And the left middle frontal gyrus
lesion previously seen on series 11, image 146 is not evident.

Other Brain findings: No significant intracranial mass effect. No
midline shift. No superimposed restricted diffusion to suggest acute
infarction. No ventriculomegaly, extra-axial collection or acute
intracranial hemorrhage. Cervicomedullary junction and pituitary are
within normal limits.

Mild hemosiderin associated with treated metastases is stable. No
dural thickening is identified.

Vascular: Major intracranial vascular flow voids are stable. On
series 16 the major dural venous sinuses are enhancing and appear to
be patent.

Skull and upper cervical spine: Negative visible cervical spinal
cord. Visualized bone marrow signal is within normal limits.

Sinuses/Orbits: Stable, negative.

Other: Visible internal auditory structures appear normal. Visible
scalp and face appear negative.
IMPRESSION: 1. Progression of disease. 15-16 small new brain metastases since
[REDACTED], many punctate. The largest are 5 mm. All lesions annotated
on series [DATE]. Two slightly larger treated metastases: Right perirolandic and
left lateral cerebellum. The former has conspicuously increased
surrounding edema but there is no significant mass effect.

3. Left thalamic metastasis has regressed.
